# Patient Record
Sex: Female | Born: 1970
Health system: Southern US, Community
[De-identification: ages and names within clinical notes are randomized; demographics above are authoritative.]

## PROBLEM LIST (undated history)

## (undated) ENCOUNTER — Inpatient Hospital Stay (HOSPITAL_COMMUNITY): Payer: Self-pay

## (undated) DIAGNOSIS — IMO0002 Reserved for concepts with insufficient information to code with codable children: Secondary | ICD-10-CM

## (undated) DIAGNOSIS — D571 Sickle-cell disease 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: Secondary | ICD-10-CM

## (undated) DIAGNOSIS — J189 Pneumonia, unspecified organism: Secondary | ICD-10-CM

## (undated) DIAGNOSIS — S81809A Unspecified open wound, unspecified lower leg, initial encounter: Secondary | ICD-10-CM

## (undated) DIAGNOSIS — R001 Bradycardia, unspecified: Secondary | ICD-10-CM

## (undated) DIAGNOSIS — K8020 Calculus of gallbladder without cholecystitis without obstruction: Secondary | ICD-10-CM

## (undated) DIAGNOSIS — R7989 Other specified abnormal findings of blood chemistry: Secondary | ICD-10-CM

## (undated) DIAGNOSIS — D649 Anemia, unspecified: Secondary | ICD-10-CM

## (undated) DIAGNOSIS — I739 Peripheral vascular disease, unspecified: Secondary | ICD-10-CM

## (undated) DIAGNOSIS — R945 Abnormal results of liver function studies: Secondary | ICD-10-CM

## (undated) DIAGNOSIS — L97909 Non-pressure chronic ulcer of unspecified part of unspecified lower leg with unspecified severity: Secondary | ICD-10-CM

## (undated) HISTORY — DX: Bradycardia, unspecified: R00.1

## (undated) HISTORY — DX: Pneumonia, unspecified organism: J18.9

## (undated) HISTORY — DX: Non-pressure chronic ulcer of unspecified part of unspecified lower leg with unspecified severity: L97.909

## (undated) HISTORY — DX: Calculus of gallbladder without cholecystitis without obstruction: K80.20

## (undated) HISTORY — DX: Reserved for concepts with insufficient information to code with codable children: IMO0002

## (undated) HISTORY — PX: BREAST SURGERY: SHX581

## (undated) HISTORY — DX: Peripheral vascular disease, unspecified: I73.9

## (undated) HISTORY — PX: TONSILLECTOMY: SUR1361

---

## 2010-07-08 ENCOUNTER — Emergency Department (HOSPITAL_COMMUNITY): Payer: Medicaid - Out of State

## 2010-07-08 ENCOUNTER — Inpatient Hospital Stay (HOSPITAL_COMMUNITY)
Admission: EM | Admit: 2010-07-08 | Discharge: 2010-07-10 | DRG: 781 | Discharge status: Against Medical Advice | Payer: Medicaid - Out of State | Attending: Family Medicine | Admitting: Family Medicine

## 2010-07-08 ENCOUNTER — Inpatient Hospital Stay (HOSPITAL_COMMUNITY): Payer: Medicaid - Out of State

## 2010-07-08 DIAGNOSIS — E876 Hypokalemia: Secondary | ICD-10-CM | POA: Diagnosis present

## 2010-07-08 DIAGNOSIS — E871 Hypo-osmolality and hyponatremia: Secondary | ICD-10-CM | POA: Diagnosis present

## 2010-07-08 DIAGNOSIS — D5701 Hb-SS disease with acute chest syndrome: Secondary | ICD-10-CM | POA: Diagnosis present

## 2010-07-08 DIAGNOSIS — D5700 Hb-SS disease with crisis, unspecified: Secondary | ICD-10-CM | POA: Diagnosis present

## 2010-07-08 DIAGNOSIS — D509 Iron deficiency anemia, unspecified: Secondary | ICD-10-CM

## 2010-07-08 DIAGNOSIS — O09529 Supervision of elderly multigravida, unspecified trimester: Secondary | ICD-10-CM | POA: Diagnosis present

## 2010-07-08 DIAGNOSIS — O99019 Anemia complicating pregnancy, unspecified trimester: Principal | ICD-10-CM | POA: Diagnosis present

## 2010-07-08 DIAGNOSIS — Z331 Pregnant state, incidental: Secondary | ICD-10-CM

## 2010-07-08 LAB — URINALYSIS, ROUTINE W REFLEX MICROSCOPIC
Bilirubin Urine: NEGATIVE
Hgb urine dipstick: NEGATIVE
Ketones, ur: NEGATIVE mg/dL
Nitrite: NEGATIVE
Protein, ur: NEGATIVE mg/dL
Specific Gravity, Urine: 1.012 (ref 1.005–1.030)
Urine Glucose, Fasting: NEGATIVE mg/dL
Urobilinogen, UA: 1 mg/dL (ref 0.0–1.0)
pH: 7.5 (ref 5.0–8.0)

## 2010-07-08 LAB — CBC
HCT: 22.5 % — ABNORMAL LOW (ref 36.0–46.0)
Hemoglobin: 8.4 g/dL — ABNORMAL LOW (ref 12.0–15.0)
MCH: 30 pg (ref 26.0–34.0)
MCHC: 37.3 g/dL — ABNORMAL HIGH (ref 30.0–36.0)
MCV: 80.4 fL (ref 78.0–100.0)
Platelets: 423 10*3/uL — ABNORMAL HIGH (ref 150–400)
RBC: 2.8 MIL/uL — ABNORMAL LOW (ref 3.87–5.11)
RDW: 20.4 % — ABNORMAL HIGH (ref 11.5–15.5)
WBC: 21.3 10*3/uL — ABNORMAL HIGH (ref 4.0–10.5)

## 2010-07-08 LAB — BASIC METABOLIC PANEL
BUN: 4 mg/dL — ABNORMAL LOW (ref 6–23)
CO2: 22 mEq/L (ref 19–32)
Calcium: 9.2 mg/dL (ref 8.4–10.5)
Chloride: 102 mEq/L (ref 96–112)
Creatinine, Ser: 0.31 mg/dL — ABNORMAL LOW (ref 0.4–1.2)
GFR calc Af Amer: >60 mL/min (ref >60)
GFR calc non Af Amer: >60 mL/min (ref >60)
Glucose, Bld: 94 mg/dL (ref 70–99)
Potassium: 3 mEq/L — ABNORMAL LOW (ref 3.5–5.1)
Sodium: 134 mEq/L — ABNORMAL LOW (ref 135–145)

## 2010-07-08 LAB — DIFFERENTIAL
Basophils Absolute: 0.2 10*3/uL — ABNORMAL HIGH (ref 0.0–0.1)
Basophils Relative: 1 % (ref 0–1)
Eosinophils Absolute: 0.2 10*3/uL (ref 0.0–0.7)
Eosinophils Relative: 1 % (ref 0–5)
Lymphocytes Relative: 15 % (ref 12–46)
Lymphs Abs: 3.2 10*3/uL (ref 0.7–4.0)
Monocytes Absolute: 2.2 10*3/uL — ABNORMAL HIGH (ref 0.1–1.0)
Monocytes Relative: 10 % (ref 3–12)
Neutro Abs: 15.5 10*3/uL — ABNORMAL HIGH (ref 1.7–7.7)
Neutrophils Relative %: 73 % (ref 43–77)

## 2010-07-08 LAB — POCT PREGNANCY, URINE: Preg Test, Ur: POSITIVE

## 2010-07-08 LAB — RETICULOCYTES
RBC.: 2.8 MIL/uL — ABNORMAL LOW (ref 3.87–5.11)
Retic Count, Absolute: 529.2 10*3/uL — ABNORMAL HIGH (ref 19.0–186.0)
Retic Ct Pct: 18.9 % — ABNORMAL HIGH (ref 0.4–3.1)

## 2010-07-09 LAB — BASIC METABOLIC PANEL
BUN: <1 mg/dL — ABNORMAL LOW (ref 6–23)
BUN: 2 mg/dL — ABNORMAL LOW (ref 6–23)
Creatinine, Ser: <3 mg/dL — ABNORMAL HIGH (ref 0.4–1.2)
Creatinine, Ser: <0.3 mg/dL — ABNORMAL LOW (ref 0.4–1.2)
Glucose, Bld: 121 mg/dL — ABNORMAL HIGH (ref 70–99)
Glucose, Bld: 126 mg/dL — ABNORMAL HIGH (ref 70–99)
Potassium: 2.9 mEq/L — ABNORMAL LOW (ref 3.5–5.1)
Potassium: 3.3 mEq/L — ABNORMAL LOW (ref 3.5–5.1)

## 2010-07-09 LAB — CBC
HCT: 19.2 % — ABNORMAL LOW (ref 36.0–46.0)
MCH: 29.7 pg (ref 26.0–34.0)
MCHC: 36.5 g/dL — ABNORMAL HIGH (ref 30.0–36.0)
MCV: 81.4 fL (ref 78.0–100.0)
Platelets: 315 10*3/uL (ref 150–400)
RDW: 20 % — ABNORMAL HIGH (ref 11.5–15.5)

## 2010-07-09 LAB — MAGNESIUM
Magnesium: 1.7 mg/dL (ref 1.5–2.5)
Magnesium: 1.8 mg/dL (ref 1.5–2.5)

## 2010-07-10 LAB — CBC
HCT: 16.8 % — ABNORMAL LOW (ref 36.0–46.0)
Hemoglobin: 6.1 g/dL — CL (ref 12.0–15.0)
Hemoglobin: 7.1 g/dL — ABNORMAL LOW (ref 12.0–15.0)
MCH: 29.5 pg (ref 26.0–34.0)
MCHC: 36.3 g/dL — ABNORMAL HIGH (ref 30.0–36.0)
RBC: 2.47 MIL/uL — ABNORMAL LOW (ref 3.87–5.11)
RDW: 19.5 % — ABNORMAL HIGH (ref 11.5–15.5)
WBC: 22.6 10*3/uL — ABNORMAL HIGH (ref 4.0–10.5)

## 2010-07-10 LAB — BASIC METABOLIC PANEL
CO2: 25 mEq/L (ref 19–32)
Calcium: 8.9 mg/dL (ref 8.4–10.5)
Creatinine, Ser: <0.3 mg/dL — ABNORMAL LOW (ref 0.4–1.2)
Glucose, Bld: 94 mg/dL (ref 70–99)

## 2010-07-10 LAB — ABO/RH: ABO/RH(D): B POS

## 2010-07-10 NOTE — H&P (Signed)
NAME:  [name], [name] NO.:  [id number]  MEDICAL RECORD NO.:  [id number]           PATIENT TYPE:  E  LOCATION:  [facility]                         FACILITY:  [facility]  PHYSICIAN:  Leighton Roach Deepika [place], M.D.DATE OF BIRTH:  Jul 08, 1970  DATE OF ADMISSION:  07/08/2010 DATE OF DISCHARGE:                             HISTORY & PHYSICAL   PRIMARY CARE PROVIDER:  The patient is unassigned, traveling for [place].  CHIEF COMPLAINT:  Sickle cell pain, crisis pain everywhere.  HISTORY OF PRESENT ILLNESS:  The patient presents complaining of sickle cell pain crisis that began this morning.  She says that she hurts all over her body but it is worse in her legs and in her back.  The patient says this is a typical pain crisis for her.  The patient says that the 1 mg doses of Dilaudid that she was on in the ED are not helping her pain. She says she has had a cough for about 2 weeks prior to the pain crisis starting, but denies any fevers or chills.  She says that she has hospitalized once or twice a year for pain crisis and her baseline hemoglobin is 7-8.  The patient is reportedly [redacted] weeks pregnant and has a positive urine pregnancy test here in the emergency room.  PAST MEDICAL HISTORY:  Significant only for sickle cell disease.  PAST SURGICAL HISTORY:  None.  SOCIAL HISTORY:  The patient lives with her father.  She braids hair for a living.  Denies tobacco, alcohol, or drug use. English as second language.  Native language is [place]. Lives in [place], [place].  FAMILY HISTORY:  The patient's mother has asthma and diabetes.  The patient's sister has sickle cell disease.  REVIEW OF SYSTEMS:  Negative for fevers or chills.  Positive for chest pain.  Negative for palpitations.  Positive for cough for 2 weeks, positive for nausea.  No vomiting.  ALLERGIES:  No known drug allergies.  MEDICATIONS:  Prenatal vitamin, folic acid, and Percocet 5/325 p.o. q.4 p.r.n. pain.  PHYSICAL  EXAMINATION:  VITAL SIGNS:  Temperature is 98.3, pulse 91, respiratory rate is 20, blood pressure is 130/64, pulse ox is 98% on room air.  GENERAL:  The patient is in distress.  She is in pain. HEAD, EYES, EARS, NOSE AND THROAT:  Extraocular movements intact.  Oral mucosa is moist. NECK:  Supple. CARDIOVASCULAR:  Regular rate and rhythm.  No murmurs, rubs, or gallops. LUNGS:  Clear to auscultation bilaterally.  The patient is in distress and appears to have increased work of breathing that may be from pain. ABDOMEN:  Positive bowel sounds.  Soft, diffuse tenderness. EXTREMITIES: 2+ pulses.  No edema. NEUROLOGIC:  The patient is alert and oriented x3.  No focal deficits. MUSCULOSKELETAL:  No joint effusions.  LABORATORY DATA AND STUDIES:  CBC with differential, white blood cell count 21.3, hemoglobin 8.4, hematocrit 22.5, platelets 423, absolute neutrophils 15.5, absolute lymphocyte 33.2, absolute monocytes 2.2, absolute eosinophil 0.2, percent retic 18.9, RBCs 2.8, absolute reticulocytes is 529.2.  Urinalysis within normal limits.  A urine pregnancy test is positive.  Basic metabolic panel, sodium  134, potassium 3.0, chloride 102, CO2 of 22, BUN 4, creatinine 0.31, glucose 94.  A chest x-ray shows borderline cardiomegaly and interstitial probable scarring compatible was given, history of sickle cell disease, although acute chest syndrome could have similar appearance.  ASSESSMENT AND PLAN:  This is a 40 year old female with history of sickle cell disease who is [redacted] weeks pregnant who presents with sickle cell pain crisis and concern for acute chest.  1. Pain crisis.  We will admit the patient to a step-down bed and give     Dilaudid 2 mg IV q.2 p.r.n. for pain.  We will monitor her     hemoglobin and transfuse if necessary, we will give IV fluids. 2. Concern for acute chest given changes on CXR that may be secondary to prior acute chest syndrome events.      The patient is in distress  from her pain, but was initially satting well on room air.  She is now being       put on oxygen due to her  distress.  We will treat her for acute chest     syndrome with ceftriaxone and azithromycin.  We will monitor her     respiratory status closely, give oxygen as needed. 3.  11 and 4/7 by live intrauterine preganancyOB ultrasound on admission .  We will continue her prenatal vitamin.  We will try to   minimize narcotics with the lowest doses that control her pain.  We     will now give NSAIDs.   4. FEN/GI:  We will give normal saline with 20 mEq of KCl at 125 mL an     hour as the patient's potassium is slightly low.  We will give     Zofran p.r.n. for nausea and Protonix for GI prophylaxis.  We will     give a regular diet as tolerated. 5. DVT prophylaxis.  We will give Lovenox as it is pregnancy category     B. 6. Disposition is pending clinical improvement.    ______________________________ Ardyth Gal, MD   ______________________________ Leighton Roach Dayveon Halley, M.D.    CR/MEDQ  D:  07/08/2010  T:  07/09/2010  Job:  [id number]  Electronically Signed by Ardyth Gal MD on 07/09/2010 03:32:59 PM Electronically Signed by Acquanetta Belling M.D. on 07/10/2010 11:42:02 AM

## 2010-07-11 LAB — CROSSMATCH: Unit division: 0

## 2010-08-20 NOTE — Discharge Summary (Signed)
NAMEHARLIE, [name] NO.:  [id number]  MEDICAL RECORD NO.:  [id number]           PATIENT TYPE:  E  LOCATION:  [facility]                         FACILITY:  [facility]  PHYSICIAN:  Santiago Bumpers. Citlalli Weikel, M.D.DATE OF BIRTH:  Dec 13, 1970  DATE OF ADMISSION:  07/08/2010 DATE OF DISCHARGE:  07/10/2010                              DISCHARGE SUMMARY   The patient left AMA on July 10, 2010.  DISCHARGE DIAGNOSES: 1. The patient left against medical advice. 2. Sickle cell pain crisis. 3. Acute chest syndrome. 4. Pregnancy, first trimester.  DISCHARGE MEDICATIONS:  The patient was given no prescriptions as she left AMA.  PERTINENT LABORATORY VALUES:  On July 08, 2010, a urine pregnancy test was positive.  A CBC with differential white blood cell count 21.3, hemoglobin 8.4, hematocrit 23.5, platelets 423, absolute neutrophils 15.5, absolute lymphocytes 3.2, absolute monocytes 2.2, eosinophils 0.2, basophils 0.2.  A urinalysis was within normal limits.  On July 09, 2010, CBC, white blood cell count 24.8, hemoglobin 7.0, hematocrit 19.2, platelets 315.  On July 10, 2010, white blood cell count 19.2, hemoglobin 6.1, hematocrit 16.8, platelets 284.  On July 10, 2010, later posttransfusion CBC white blood cell count 22.6, hemoglobin 7.1, hematocrit 19.5, platelets 288.  RADIOLOGY:  On July 08, 2010, chest x-ray showed borderline cardiomegaly and interstitial probable scarring compatible with given history of sickle cell disease, although acute chest syndrome could have a similar appearance.  Ultrasound OB on July 08, 2010, showed an 50- week 4-day live intrauterine gestation.  PROCEDURES:  Blood transfusion of 1 unit packed red blood cells.  BRIEF HOSPITAL COURSE:  Ms. Oetken is a 40 year old female with past medical history of sickle cell disease who presented to the hospital with sickle cell pain crisis.  She was admitted to step-down unit, placed on  a Dilaudid PCA and monitored closely on. 1. Sickle cell.  The patient had severe pain and she required high     doses of Dilaudid for pain control.  Her respiratory status was     monitored closely.  She did require oxygen during hospitalization.     There is concern that the patient had acute chest syndrome, so she     was started on azithromycin and ceftriaxone for treatment of this.     On July 10, 2010, the patient's hemoglobin dropped to 6.1, and     she was transfused 1 unit of packed red blood cells and her     hemoglobin responded appropriately. 2. First trimester pregnancy.  The patient knew she was pregnant on     presentation.  Ultrasound was done which confirmed a viable healthy     intrauterine pregnancy.  The patient was placed on carefully     selected medications, all medications she was given during     hospitalization for pregnancy class B with the exception of     narcotic medications which are pregnancy class C, which were     required to treat the patient's sickle cell pain crisis. 3. The patient left AMA.  The patient informed the medical team that     she had to  leave on the July 10, 2010, because she was not to     leave the state of [place], she is an immigrant and have had a     visit from her Sports administrator on the next day.  She was not     supposed to have left the state of [place].  The patient was     advised that she may have acute chest syndrome and that her pain     would not be controlled as she left the hospital and on that she     could pose a risk to herself as well as her unborn child.  The     patient understood these risks, however, felt it was necessary for     her to travel back to [place] and so the patient left AMA.  DISCHARGE CONDITION:  The patient left against medical advice on July 10, 2010.    ______________________________ Ardyth Gal, MD   ______________________________ Santiago Bumpers. Leveda Anna,  M.D.    CR/MEDQ  D:  08/15/2010  T:  08/16/2010  Job:  [id number]  Electronically Signed by Ardyth Gal MD on 08/17/2010 04:56:51 PM Electronically Signed by Doralee Albino M.D. on 08/20/2010 08:51:44 AM

## 2012-03-29 ENCOUNTER — Emergency Department (HOSPITAL_COMMUNITY): Payer: Medicaid - Out of State

## 2012-03-29 ENCOUNTER — Inpatient Hospital Stay (HOSPITAL_COMMUNITY)
Admission: EM | Admit: 2012-03-29 | Discharge: 2012-04-06 | DRG: 812 | Disposition: A | Discharge status: Home / Self Care | Payer: Medicaid - Out of State | Attending: Internal Medicine | Admitting: Internal Medicine

## 2012-03-29 ENCOUNTER — Encounter (HOSPITAL_COMMUNITY): Payer: Self-pay | Admitting: Emergency Medicine

## 2012-03-29 DIAGNOSIS — R197 Diarrhea, unspecified: Secondary | ICD-10-CM

## 2012-03-29 DIAGNOSIS — L97309 Non-pressure chronic ulcer of unspecified ankle with unspecified severity: Secondary | ICD-10-CM | POA: Diagnosis present

## 2012-03-29 DIAGNOSIS — D72829 Elevated white blood cell count, unspecified: Secondary | ICD-10-CM

## 2012-03-29 DIAGNOSIS — D571 Sickle-cell disease 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: Secondary | ICD-10-CM

## 2012-03-29 DIAGNOSIS — R109 Unspecified abdominal pain: Secondary | ICD-10-CM

## 2012-03-29 DIAGNOSIS — IMO0002 Reserved for concepts with insufficient information to code with codable children: Secondary | ICD-10-CM

## 2012-03-29 DIAGNOSIS — K5900 Constipation, unspecified: Secondary | ICD-10-CM

## 2012-03-29 DIAGNOSIS — D5701 Hb-SS disease with acute chest syndrome: Secondary | ICD-10-CM | POA: Diagnosis present

## 2012-03-29 DIAGNOSIS — D5700 Hb-SS disease with crisis, unspecified: Principal | ICD-10-CM

## 2012-03-29 DIAGNOSIS — R112 Nausea with vomiting, unspecified: Secondary | ICD-10-CM

## 2012-03-29 DIAGNOSIS — R509 Fever, unspecified: Secondary | ICD-10-CM

## 2012-03-29 DIAGNOSIS — E871 Hypo-osmolality and hyponatremia: Secondary | ICD-10-CM

## 2012-03-29 DIAGNOSIS — E876 Hypokalemia: Secondary | ICD-10-CM

## 2012-03-29 DIAGNOSIS — K5903 Drug induced constipation: Secondary | ICD-10-CM | POA: Diagnosis not present

## 2012-03-29 DIAGNOSIS — R001 Bradycardia, unspecified: Secondary | ICD-10-CM

## 2012-03-29 DIAGNOSIS — R17 Unspecified jaundice: Secondary | ICD-10-CM

## 2012-03-29 DIAGNOSIS — R7989 Other specified abnormal findings of blood chemistry: Secondary | ICD-10-CM

## 2012-03-29 DIAGNOSIS — D649 Anemia, unspecified: Secondary | ICD-10-CM

## 2012-03-29 DIAGNOSIS — K8020 Calculus of gallbladder without cholecystitis without obstruction: Secondary | ICD-10-CM

## 2012-03-29 DIAGNOSIS — I498 Other specified cardiac arrhythmias: Secondary | ICD-10-CM | POA: Diagnosis present

## 2012-03-29 DIAGNOSIS — L089 Local infection of the skin and subcutaneous tissue, unspecified: Secondary | ICD-10-CM

## 2012-03-29 HISTORY — DX: Anemia, unspecified: D64.9

## 2012-03-29 HISTORY — DX: Sickle-cell disease 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: D57.1

## 2012-03-29 LAB — COMPREHENSIVE METABOLIC PANEL
AST: 175 U/L — ABNORMAL HIGH (ref 0–37)
Albumin: 4.8 g/dL (ref 3.5–5.2)
Alkaline Phosphatase: 72 U/L (ref 39–117)
BUN: 6 mg/dL (ref 6–23)
Chloride: 93 mEq/L — ABNORMAL LOW (ref 96–112)
Potassium: 3.4 mEq/L — ABNORMAL LOW (ref 3.5–5.1)
Total Bilirubin: 5.2 mg/dL — ABNORMAL HIGH (ref 0.3–1.2)

## 2012-03-29 LAB — CBC WITH DIFFERENTIAL/PLATELET
Basophils Relative: 1 % (ref 0–1)
Eosinophils Relative: 5 % (ref 0–5)
HCT: 16.1 % — ABNORMAL LOW (ref 36.0–46.0)
Hemoglobin: 6.4 g/dL — CL (ref 12.0–15.0)
Lymphs Abs: 2 10*3/uL (ref 0.7–4.0)
MCV: 72.9 fL — ABNORMAL LOW (ref 78.0–100.0)
Monocytes Relative: 14 % — ABNORMAL HIGH (ref 3–12)
Platelets: 269 10*3/uL (ref 150–400)
RBC: 2.21 MIL/uL — ABNORMAL LOW (ref 3.87–5.11)
WBC: 12 10*3/uL — ABNORMAL HIGH (ref 4.0–10.5)

## 2012-03-29 LAB — POCT I-STAT TROPONIN I: Troponin i, poc: 0.01 ng/mL (ref 0.00–0.08)

## 2012-03-29 MED ORDER — HYDROMORPHONE HCL PF 1 MG/ML IJ SOLN
1.0000 mg | Freq: Once | INTRAMUSCULAR | Status: AC
  Administered 2012-03-30: 1 mg via INTRAVENOUS
  Filled 2012-03-29: qty 1

## 2012-03-29 MED ORDER — SODIUM CHLORIDE 0.9 % IV BOLUS (SEPSIS)
1000.0000 mL | Freq: Once | INTRAVENOUS | Status: AC
  Administered 2012-03-30: 1000 mL via INTRAVENOUS

## 2012-03-29 NOTE — ED Notes (Addendum)
Pt currently in xray, lab notified ED of low hgb (6.4), hgb hx reviewed, usually low, (6.1 62yr ago), acuity changed, finding room for pt.

## 2012-03-29 NOTE — ED Notes (Signed)
Patient complaining of nausea, vomiting, fever, dizziness chest pain, diarrhea, and back pain for the last four days.  Patient reports history of sickle cell; feels that her symptoms are related to a crisis.

## 2012-03-30 ENCOUNTER — Inpatient Hospital Stay (HOSPITAL_COMMUNITY): Payer: Medicaid - Out of State

## 2012-03-30 ENCOUNTER — Encounter (HOSPITAL_COMMUNITY): Payer: Self-pay | Admitting: Nurse Practitioner

## 2012-03-30 ENCOUNTER — Emergency Department (HOSPITAL_COMMUNITY): Payer: Medicaid - Out of State

## 2012-03-30 DIAGNOSIS — R109 Unspecified abdominal pain: Secondary | ICD-10-CM | POA: Diagnosis present

## 2012-03-30 DIAGNOSIS — D5700 Hb-SS disease with crisis, unspecified: Secondary | ICD-10-CM | POA: Diagnosis present

## 2012-03-30 DIAGNOSIS — K8020 Calculus of gallbladder without cholecystitis without obstruction: Secondary | ICD-10-CM | POA: Diagnosis present

## 2012-03-30 DIAGNOSIS — R112 Nausea with vomiting, unspecified: Secondary | ICD-10-CM

## 2012-03-30 DIAGNOSIS — R7989 Other specified abnormal findings of blood chemistry: Secondary | ICD-10-CM

## 2012-03-30 DIAGNOSIS — D72829 Elevated white blood cell count, unspecified: Secondary | ICD-10-CM | POA: Diagnosis present

## 2012-03-30 DIAGNOSIS — D571 Sickle-cell disease 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: Secondary | ICD-10-CM

## 2012-03-30 DIAGNOSIS — D649 Anemia, unspecified: Secondary | ICD-10-CM

## 2012-03-30 DIAGNOSIS — E876 Hypokalemia: Secondary | ICD-10-CM | POA: Diagnosis present

## 2012-03-30 DIAGNOSIS — R17 Unspecified jaundice: Secondary | ICD-10-CM

## 2012-03-30 HISTORY — DX: Calculus of gallbladder without cholecystitis without obstruction: K80.20

## 2012-03-30 HISTORY — DX: Anemia, unspecified: D64.9

## 2012-03-30 LAB — CBC
MCH: 28.5 pg (ref 26.0–34.0)
MCHC: 38.4 g/dL — ABNORMAL HIGH (ref 30.0–36.0)
MCV: 74.2 fL — ABNORMAL LOW (ref 78.0–100.0)
Platelets: 209 10*3/uL (ref 150–400)
RDW: 22.5 % — ABNORMAL HIGH (ref 11.5–15.5)

## 2012-03-30 LAB — COMPREHENSIVE METABOLIC PANEL
AST: 121 U/L — ABNORMAL HIGH (ref 0–37)
Albumin: 3.4 g/dL — ABNORMAL LOW (ref 3.5–5.2)
Calcium: 7.3 mg/dL — ABNORMAL LOW (ref 8.4–10.5)
Creatinine, Ser: 0.41 mg/dL — ABNORMAL LOW (ref 0.50–1.10)
GFR calc non Af Amer: >90 mL/min (ref >90)
Sodium: 139 mEq/L (ref 135–145)
Total Protein: 6.1 g/dL (ref 6.0–8.3)

## 2012-03-30 LAB — URINE MICROSCOPIC-ADD ON

## 2012-03-30 LAB — RETICULOCYTES
RBC.: 2.06 MIL/uL — ABNORMAL LOW (ref 3.87–5.11)
Retic Count, Absolute: 350.2 10*3/uL — ABNORMAL HIGH (ref 19.0–186.0)

## 2012-03-30 LAB — URINALYSIS, ROUTINE W REFLEX MICROSCOPIC
Glucose, UA: NEGATIVE mg/dL
Leukocytes, UA: NEGATIVE
Protein, ur: >300 mg/dL — AB
Specific Gravity, Urine: 1.014 (ref 1.005–1.030)
pH: 5.5 (ref 5.0–8.0)

## 2012-03-30 LAB — PREPARE RBC (CROSSMATCH)

## 2012-03-30 MED ORDER — KETOROLAC TROMETHAMINE 30 MG/ML IJ SOLN
30.0000 mg | Freq: Four times a day (QID) | INTRAMUSCULAR | Status: AC | PRN
  Administered 2012-03-30 – 2012-04-04 (×7): 30 mg via INTRAVENOUS
  Filled 2012-03-30 (×7): qty 1

## 2012-03-30 MED ORDER — SODIUM CHLORIDE 0.9 % IJ SOLN: 9.0000 mL | INTRAMUSCULAR | Status: DC | PRN

## 2012-03-30 MED ORDER — ONDANSETRON HCL 4 MG/2ML IJ SOLN: 4.0000 mg | Freq: Three times a day (TID) | INTRAMUSCULAR | Status: AC | PRN

## 2012-03-30 MED ORDER — DOCUSATE SODIUM 100 MG PO CAPS
100.0000 mg | ORAL_CAPSULE | Freq: Two times a day (BID) | ORAL | Status: DC
  Administered 2012-03-30 – 2012-04-03 (×9): 100 mg via ORAL
  Filled 2012-03-30 (×18): qty 1

## 2012-03-30 MED ORDER — PANTOPRAZOLE SODIUM 40 MG IV SOLR
40.0000 mg | INTRAVENOUS | Status: DC
  Administered 2012-03-30 – 2012-04-01 (×3): 40 mg via INTRAVENOUS
  Filled 2012-03-30 (×5): qty 40

## 2012-03-30 MED ORDER — NALOXONE HCL 0.4 MG/ML IJ SOLN: 0.4000 mg | INTRAMUSCULAR | Status: DC | PRN

## 2012-03-30 MED ORDER — IBUPROFEN 400 MG PO TABS
400.0000 mg | ORAL_TABLET | Freq: Once | ORAL | Status: AC
  Administered 2012-03-30: 400 mg via ORAL
  Filled 2012-03-30: qty 1

## 2012-03-30 MED ORDER — HYDROMORPHONE HCL PF 1 MG/ML IJ SOLN
1.0000 mg | INTRAMUSCULAR | Status: AC | PRN
  Administered 2012-03-30 (×2): 1 mg via INTRAVENOUS
  Filled 2012-03-30 (×2): qty 1

## 2012-03-30 MED ORDER — SODIUM CHLORIDE 0.9 % IV BOLUS (SEPSIS)
1000.0000 mL | Freq: Once | INTRAVENOUS | Status: AC
  Administered 2012-03-30: 1000 mL via INTRAVENOUS

## 2012-03-30 MED ORDER — BIOTENE DRY MOUTH MT LIQD
15.0000 mL | Freq: Two times a day (BID) | OROMUCOSAL | Status: DC
  Administered 2012-03-30 – 2012-04-05 (×9): 15 mL via OROMUCOSAL

## 2012-03-30 MED ORDER — ENOXAPARIN SODIUM 60 MG/0.6ML ~~LOC~~ SOLN
55.0000 mg | Freq: Two times a day (BID) | SUBCUTANEOUS | Status: DC
  Administered 2012-03-30 – 2012-04-01 (×4): 55 mg via SUBCUTANEOUS
  Filled 2012-03-30 (×8): qty 0.6

## 2012-03-30 MED ORDER — SODIUM CHLORIDE 0.9 % IV SOLN: INTRAVENOUS | Status: AC

## 2012-03-30 MED ORDER — HYDROMORPHONE 0.3 MG/ML IV SOLN
INTRAVENOUS | Status: DC
  Administered 2012-03-30: 21:00:00 via INTRAVENOUS
  Administered 2012-03-31: 4.79 mg via INTRAVENOUS
  Administered 2012-03-31: 1.99 mg via INTRAVENOUS
  Administered 2012-03-31: 06:00:00 via INTRAVENOUS
  Administered 2012-03-31: 6.39 mg via INTRAVENOUS
  Administered 2012-03-31 (×2): via INTRAVENOUS
  Administered 2012-03-31: 0.8 mg via INTRAVENOUS
  Administered 2012-03-31: 2.79 mg via INTRAVENOUS
  Administered 2012-03-31: 4.39 mg via INTRAVENOUS
  Administered 2012-04-01: 05:00:00 via INTRAVENOUS
  Administered 2012-04-01: 1.95 mg via INTRAVENOUS
  Administered 2012-04-01: 4.99 mg via INTRAVENOUS
  Administered 2012-04-01: 12:00:00 via INTRAVENOUS
  Filled 2012-03-30 (×6): qty 25

## 2012-03-30 MED ORDER — ONDANSETRON HCL 4 MG/2ML IJ SOLN
4.0000 mg | Freq: Four times a day (QID) | INTRAMUSCULAR | Status: DC | PRN
  Administered 2012-04-01 – 2012-04-02 (×2): 4 mg via INTRAVENOUS
  Filled 2012-03-30 (×2): qty 2

## 2012-03-30 MED ORDER — DIPHENHYDRAMINE HCL 12.5 MG/5ML PO ELIX: 12.5000 mg | ORAL_SOLUTION | Freq: Four times a day (QID) | ORAL | Status: DC | PRN

## 2012-03-30 MED ORDER — POTASSIUM CHLORIDE IN NACL 20-0.45 MEQ/L-% IV SOLN
INTRAVENOUS | Status: DC
  Administered 2012-03-30: 21:00:00 via INTRAVENOUS
  Filled 2012-03-30 (×2): qty 1000

## 2012-03-30 MED ORDER — ONDANSETRON HCL 4 MG PO TABS: 4.0000 mg | ORAL_TABLET | Freq: Four times a day (QID) | ORAL | Status: DC | PRN

## 2012-03-30 MED ORDER — DIPHENHYDRAMINE HCL 50 MG/ML IJ SOLN
12.5000 mg | Freq: Four times a day (QID) | INTRAMUSCULAR | Status: DC | PRN
Start: 2012-03-30 — End: 2012-04-06

## 2012-03-30 MED ORDER — SODIUM CHLORIDE 0.9 % IJ SOLN
3.0000 mL | Freq: Two times a day (BID) | INTRAMUSCULAR | Status: DC
  Administered 2012-03-30 – 2012-04-05 (×10): 3 mL via INTRAVENOUS

## 2012-03-30 MED ORDER — SENNOSIDES-DOCUSATE SODIUM 8.6-50 MG PO TABS
1.0000 | ORAL_TABLET | Freq: Every evening | ORAL | Status: DC | PRN
  Filled 2012-03-30 (×3): qty 2

## 2012-03-30 MED ORDER — SODIUM CHLORIDE 0.9 % IV SOLN
INTRAVENOUS | Status: AC
  Administered 2012-03-30: 1000 mL via INTRAVENOUS
  Administered 2012-03-30: 04:00:00 via INTRAVENOUS

## 2012-03-30 MED ORDER — LEVOFLOXACIN IN D5W 750 MG/150ML IV SOLN
750.0000 mg | INTRAVENOUS | Status: DC
  Administered 2012-03-30 – 2012-04-05 (×7): 750 mg via INTRAVENOUS
  Filled 2012-03-30 (×8): qty 150

## 2012-03-30 MED ORDER — ONDANSETRON HCL 4 MG/2ML IJ SOLN
4.0000 mg | Freq: Four times a day (QID) | INTRAMUSCULAR | Status: DC | PRN
  Administered 2012-03-30 – 2012-03-31 (×4): 4 mg via INTRAVENOUS
  Filled 2012-03-30 (×6): qty 2

## 2012-03-30 MED ORDER — HYDROMORPHONE HCL PF 1 MG/ML IJ SOLN: 2.0000 mg | INTRAMUSCULAR | Status: DC | PRN

## 2012-03-30 MED ORDER — FOLIC ACID 1 MG PO TABS
1.0000 mg | ORAL_TABLET | Freq: Every day | ORAL | Status: DC
  Administered 2012-03-30 – 2012-04-05 (×7): 1 mg via ORAL
  Filled 2012-03-30 (×8): qty 1

## 2012-03-30 MED ORDER — HYDROMORPHONE HCL PF 1 MG/ML IJ SOLN
2.0000 mg | INTRAMUSCULAR | Status: DC | PRN
  Administered 2012-03-30: 2 mg via INTRAVENOUS
  Filled 2012-03-30 (×2): qty 1

## 2012-03-30 NOTE — Progress Notes (Signed)
UR COMPLETED  

## 2012-03-30 NOTE — ED Notes (Signed)
Admitting physician at bedside

## 2012-03-30 NOTE — Consult Note (Signed)
Reason for Consult:  cholelithiasis Referring Physician: Marisa Severin, MD  [name] is an 41 y.o. female.  HPI:  Pt is a 41 year old female with sickle cell disease. She presents with 4 days of epigastric, chest, and abdominal pain.  She also has had some diarrhea. She complains of fevers and diffuse aches and pains. She is jaundiced.  She has had nausea and vomiting in association with this pain. She points to her epigastric region going up into her mid chest. She has also felt weaker and had some shortness of breath associated with this. She has had a sickle crisis around 2 years ago and does not think that this is the same as that one was.  She is from [place].    Past Medical History  Diagnosis Date  . Sickle cell disease     Past Surgical History  Procedure Date  . Cesarean section     History reviewed. No pertinent family history.  Social History:  reports that she has never smoked. She does not have any smokeless tobacco history on file. She reports that she does not drink alcohol or use illicit drugs.  Allergies: No Known Allergies  Medications:  MedicationsLong-Term  Prescriptions Show Facility-Administered Medications   acetaminophen (TYLENOL) 325 MG tablet  folic acid (FOLVITE) 1 MG tablet     Results for orders placed during the hospital encounter of 03/29/12 (from the past 48 hour(s))  CBC WITH DIFFERENTIAL     Status: Abnormal   Collection Time   03/29/12 10:06 PM      Component Value Range Comment   WBC 12.0 (*) 4.0 - 10.5 K/uL    RBC 2.21 (*) 3.87 - 5.11 MIL/uL    Hemoglobin 6.4 (*) 12.0 - 15.0 g/dL    HCT 11.9 (*) 14.7 - 46.0 %    MCV 72.9 (*) 78.0 - 100.0 fL    MCH 29.0  26.0 - 34.0 pg    MCHC 39.8 (*) 30.0 - 36.0 g/dL SICKLE CELLS   RDW 82.9 (*) 11.5 - 15.5 %    Platelets 269  150 - 400 K/uL PLATELET COUNT CONFIRMED BY SMEAR   Neutrophils Relative 63  43 - 77 %    Lymphocytes Relative 17  12 - 46 %    Monocytes Relative 14 (*) 3 - 12 %    Eosinophils Relative 5  0 - 5 %    Basophils Relative 1  0 - 1 %    Neutro Abs 7.6  1.7 - 7.7 K/uL    Lymphs Abs 2.0  0.7 - 4.0 K/uL    Monocytes Absolute 1.7 (*) 0.1 - 1.0 K/uL    Eosinophils Absolute 0.6  0.0 - 0.7 K/uL    Basophils Absolute 0.1  0.0 - 0.1 K/uL    RBC Morphology MARKED POLYCHROMASIA      WBC Morphology MILD LEFT SHIFT (1-5% METAS, OCC MYELO, OCC BANDS)     COMPREHENSIVE METABOLIC PANEL     Status: Abnormal   Collection Time   03/29/12 10:06 PM      Component Value Range Comment   Sodium 129 (*) 135 - 145 mEq/L    Potassium 3.4 (*) 3.5 - 5.1 mEq/L    Chloride 93 (*) 96 - 112 mEq/L    CO2 22  19 - 32 mEq/L    Glucose, Bld 105 (*) 70 - 99 mg/dL    BUN 6  6 - 23 mg/dL    Creatinine, Ser 5.62  0.50 - 1.10 mg/dL  Calcium 8.9  8.4 - 10.5 mg/dL    Total Protein 8.0  6.0 - 8.3 g/dL    Albumin 4.8  3.5 - 5.2 g/dL    AST 962 (*) 0 - 37 U/L    ALT 62 (*) 0 - 35 U/L    Alkaline Phosphatase 72  39 - 117 U/L    Total Bilirubin 5.2 (*) 0.3 - 1.2 mg/dL    GFR calc non Af Amer >90  >90 mL/min    GFR calc Af Amer >90  >90 mL/min   LIPASE, BLOOD     Status: Normal   Collection Time   03/29/12 10:06 PM      Component Value Range Comment   Lipase 30  11 - 59 U/L   POCT I-STAT TROPONIN I     Status: Normal   Collection Time   03/29/12 10:36 PM      Component Value Range Comment   Troponin i, poc 0.01  0.00 - 0.08 ng/mL    Comment 3            LACTIC ACID, PLASMA     Status: Normal   Collection Time   03/29/12 11:51 PM      Component Value Range Comment   Lactic Acid, Venous 1.0  0.5 - 2.2 mmol/L   RETICULOCYTES     Status: Abnormal   Collection Time   03/30/12 12:00 AM      Component Value Range Comment   Retic Ct Pct 17.0 (*) 0.4 - 3.1 % RESULTS CONFIRMED BY MANUAL DILUTION   RBC. 2.06 (*) 3.87 - 5.11 MIL/uL    Retic Count, Manual 350.2 (*) 19.0 - 186.0 K/uL   TYPE AND SCREEN     Status: Normal   Collection Time   03/30/12 12:17 AM      Component Value Range  Comment   ABO/RH(D) B POS      Antibody Screen NEG      Sample Expiration 04/02/2012     PREPARE RBC (CROSSMATCH)     Status: Normal   Collection Time   03/30/12 12:18 AM      Component Value Range Comment   Order Confirmation ORDER PROCESSED BY BLOOD BANK       Dg Chest 2 View  03/29/2012  *RADIOLOGY REPORT*  Clinical Data: Fever and emesis.  History of sickle cell disease.  CHEST - 2 VIEW  Comparison: 07/08/2010  Findings: Heart size is mildly enlarged.  There is no pleural effusion identified.  The bilateral, upper lobe predominant interstitial prominence is likely reflects scarring.  No superimposed airspace consolidation.  Bony changes of sickle cell disease noted.  IMPRESSION:  1.  No acute cardiopulmonary abnormalities noted.   Original Report Authenticated By: [name], M.D.    US Abdomen Complete  03/30/2012  *RADIOLOGY REPORT*  Clinical Data:  Elevated LFTs, jaundice.  Nausea, vomiting.  COMPLETE ABDOMINAL ULTRASOUND  Comparison:  None.  Findings:  Gallbladder:  Multiple mobile gallstones within the gallbladder. No wall thickening.  Negative sonographic Murphy's.  Common bile duct:   Normal caliber, 3 mm.  Liver:  No focal lesion identified.  Within normal limits in parenchymal echogenicity.  IVC:  Appears normal.  Pancreas:  No focal abnormality seen.  Spleen:  Not definitively visualized.  Right Kidney:   Slightly increased echotexture.  13.8 cm.  No hydronephrosis or focal abnormality.  Left Kidney:  13.2 cm.  Slightly increased echotexture.  No hydronephrosis or focal abnormality.  Abdominal aorta:  No  aneurysm identified.  IMPRESSION: Cholelithiasis.  No sonographic evidence of acute cholecystitis.   Original Report Authenticated By: Charlett Nose, M.D.     Review of Systems  Constitutional: Positive for fever and malaise/fatigue. Negative for chills.  HENT: Negative.   Eyes: Negative.   Respiratory: Positive for shortness of breath.   Cardiovascular: Positive for chest  pain.  Gastrointestinal: Positive for nausea, vomiting, abdominal pain and diarrhea.  Genitourinary: Negative.   Musculoskeletal: Positive for myalgias.  Skin:       Jaundice   Neurological: Negative.   Endo/Heme/Allergies: Negative.   Psychiatric/Behavioral: Negative.    Blood pressure 121/67, pulse 71, temperature 98.5 F (36.9 C), temperature source Oral, resp. rate 18, last menstrual period 03/22/2012, SpO2 98.00%. Physical Exam  Constitutional: She is oriented to person, place, and time. She appears well-developed and well-nourished. No distress.  HENT:  Head: Normocephalic and atraumatic.  Eyes: Conjunctivae normal are normal. Pupils are equal, round, and reactive to light. Scleral icterus is present.  Neck: Normal range of motion. Neck supple. No tracheal deviation present. No thyromegaly present.  Cardiovascular: Normal rate, regular rhythm, normal heart sounds and intact distal pulses.   No murmur heard. Respiratory: Effort normal and breath sounds normal. No respiratory distress. She has no wheezes. She has no rales. She exhibits no tenderness.  GI: Soft. Bowel sounds are normal. She exhibits no distension and no mass. There is Tenderness: mild tenderness in epigastrium.. There is no rebound and no guarding.  Musculoskeletal: Normal range of motion. She exhibits no edema and no tenderness.  Neurological: She is alert and oriented to person, place, and time.  Skin: Skin is warm and dry. She is not diaphoretic. No erythema.  Psychiatric: She has a normal mood and affect. Her behavior is normal. Judgment and thought content normal.    Assessment/Plan: Cholelithiasis. Patient does not appear to have any evidence of acute cholecystitis. Her bilirubin is elevated but she does not appear to have choledocholithiasis on imaging. I think her liver function abnormalities are reflection of a sickle cell crisis. Her hemoglobin is down and her reticulocyte count is up.  The surgery team  will follow her as she is admitted to the medicine service and hydrated and had her pain treated.  We will see if her pain resolves as her sickle cell crisis.  No need for acute surgical intervention. We'll follow.   Sundra Haddix 03/30/2012, 2:32 AM

## 2012-03-30 NOTE — Progress Notes (Signed)
Pt transferred to [facility] via ambulance.   No acute distress noted.  Amanda Pea, Charity fundraiser.

## 2012-03-30 NOTE — Progress Notes (Signed)
ANTICOAGULATION CONSULT NOTE - Initial Consult  Pharmacy Consult for lovenox Indication: r/o ACS; chest pain  No Known Allergies  Patient Measurements: Height: 5\' 7"  (170.2 cm) Weight: 122 lb 6.4 oz (55.52 kg) IBW/kg (Calculated) : 61.6  Heparin Dosing Weight: 55 kg  Vital Signs: Temp: 97.5 F (36.4 C) (11/11 0745) Temp src: Oral (11/11 0745) BP: 95/51 mmHg (11/11 0745) Pulse Rate: 53  (11/11 0745)  Labs:  Basename 03/30/12 0920 03/29/12 2206  HGB 6.3* 6.4*  HCT 16.4* 16.1*  PLT 209 269  APTT -- --  LABPROT -- --  INR -- --  HEPARINUNFRC -- --  CREATININE 0.41* 0.51  CKTOTAL -- --  CKMB -- --  TROPONINI -- --    Estimated Creatinine Clearance: 81.9 ml/min (by C-G formula based on Cr of 0.41).   Medical History: Past Medical History  Diagnosis Date  . Sickle cell disease   . Anemia 03/30/2012    Hx of sickle cell disease    Medications:  Scheduled:    . sodium chloride   Intravenous STAT  . antiseptic oral rinse  15 mL Mouth Rinse BID  . docusate sodium  100 mg Oral BID  . folic acid  1 mg Oral Daily  . [COMPLETED]  HYDROmorphone (DILAUDID) injection  1 mg Intravenous Once  . [COMPLETED] ibuprofen  400 mg Oral Once  . levofloxacin (LEVAQUIN) IV  750 mg Intravenous Q24H  . pantoprazole (PROTONIX) IV  40 mg Intravenous Q24H  . [COMPLETED] sodium chloride  1,000 mL Intravenous Once  . [COMPLETED] sodium chloride  1,000 mL Intravenous Once  . sodium chloride  3 mL Intravenous Q12H   Infusions:    . sodium chloride 1,000 mL (03/30/12 1058)    Assessment: 41 yo female with hx of sickle cell disease will be started on lovenox full dose for r/o ACS and chest pain.  Wt 55.5 kg and CrCl ~82.  H/H 6.3/16.4. Plt 209 Goal of Therapy:  Anti-Xa level 0.6-1.2 units/ml 4hrs after LMWH dose given Monitor platelets by anticoagulation protocol: Yes   Plan:  1) Lovenox 55mg  sq q12h 2) CBC every 72 hours (monitor closely)  Daren Yeagle, Tsz-Yin 03/30/2012,11:22 AM

## 2012-03-30 NOTE — Progress Notes (Signed)
Subjective:  Patient is a 41 year old the [place] female originally from [place] most recently from [place] 2 is admitted to the hospital with to sickle cell crisis. 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 She's not been feeling well for 4 days prior to admission. He is presently experiencing abdominal pain diffusely as well as limb pain. Her pain at most was a 10 out of 10. Presently it as a 8/10. She notes the present pain regimen is not controlling her pain  completely. This the first recent crisis. This is atypical for usual crisis which normally occurs in her extremities.   Her history is also significant for a nonhealing leg ulcer for the past year. She reports this area with nearly healed at times and draped back down again. She's not had on any other joint.  No Known Allergies Current Facility-Administered Medications  Medication Dose Route Frequency Provider Last Rate Last Dose  . 0.45 % NaCl with KCl 20 mEq / L infusion   Intravenous Continuous Gwenyth Bender, MD      . [COMPLETED] 0.9 %  sodium chloride infusion   Intravenous STAT Olivia Mackie, MD      . [EXPIRED] 0.9 %  sodium chloride infusion   Intravenous Continuous Rodolph Bong, MD 125 mL/hr at 03/30/12 1058 1,000 mL at 03/30/12 1058  . antiseptic oral rinse (BIOTENE) solution 15 mL  15 mL Mouth Rinse BID Tarry Kos, MD   15 mL at 03/30/12 2026  . diphenhydrAMINE (BENADRYL) injection 12.5 mg  12.5 mg Intravenous Q6H PRN Gwenyth Bender, MD       Or  . diphenhydrAMINE (BENADRYL) 12.5 MG/5ML elixir 12.5 mg  12.5 mg Oral Q6H PRN Gwenyth Bender, MD      . docusate sodium (COLACE) capsule 100 mg  100 mg Oral BID Rodolph Bong, MD   100 mg at 03/30/12 0931  . enoxaparin (LOVENOX) injection 55 mg  55 mg Subcutaneous Q12H Gwenyth Bender, MD   55 mg at 03/30/12 1528  . folic acid (FOLVITE) tablet 1 mg  1 mg Oral Daily Tarry Kos, MD   1 mg at 03/30/12 1610  . [COMPLETED] HYDROmorphone (DILAUDID) injection 1 mg  1 mg Intravenous Once Olivia Mackie, MD   1 mg at 03/30/12 0016  . [EXPIRED] HYDROmorphone (DILAUDID) injection 1 mg  1 mg Intravenous Q4H PRN Olivia Mackie, MD   1 mg at 03/30/12 1541  . HYDROmorphone (DILAUDID) injection 2 mg  2 mg Intravenous Q2H PRN Gwenyth Bender, MD      . HYDROmorphone (DILAUDID) PCA injection 0.3 mg/mL   Intravenous Q4H Gwenyth Bender, MD      . [address] ibuprofen (ADVIL,MOTRIN) tablet 400 mg  400 mg Oral Once Rolan Lipa, NP   400 mg at 03/30/12 0358  . ketorolac (TORADOL) 30 MG/ML injection 30 mg  30 mg Intravenous Q6H PRN Rodolph Bong, MD   30 mg at 03/30/12 2024  . levofloxacin (LEVAQUIN) IVPB 750 mg  750 mg Intravenous Q24H Rodolph Bong, MD   750 mg at 03/30/12 1527  . naloxone [facility]) injection 0.4 mg  0.4 mg Intravenous PRN Gwenyth Bender, MD       And  . sodium chloride 0.9 % injection 9 mL  9 mL Intravenous PRN Gwenyth Bender, MD      . [EXPIRED] ondansetron [facility]) injection 4 mg  4 mg Intravenous Q8H PRN Olivia Mackie, MD      .  ondansetron (ZOFRAN) tablet 4 mg  4 mg Oral Q6H PRN Tarry Kos, MD       Or  . ondansetron [facility]) injection 4 mg  4 mg Intravenous Q6H PRN Tarry Kos, MD   4 mg at 03/30/12 1910  . ondansetron (ZOFRAN) injection 4 mg  4 mg Intravenous Q6H PRN Gwenyth Bender, MD      . pantoprazole (PROTONIX) injection 40 mg  40 mg Intravenous Q24H Rodolph Bong, MD   40 mg at 03/30/12 1527  . senna-docusate (Senokot-S) tablet 1-2 tablet  1-2 tablet Oral QHS PRN Rodolph Bong, MD      . [COMPLETED] sodium chloride 0.9 % bolus 1,000 mL  1,000 mL Intravenous Once Olivia Mackie, MD   1,000 mL at 03/30/12 0016  . [COMPLETED] sodium chloride 0.9 % bolus 1,000 mL  1,000 mL Intravenous Once Olivia Mackie, MD   1,000 mL at 03/30/12 0145  . sodium chloride 0.9 % injection 3 mL  3 mL Intravenous Q12H Tarry Kos, MD   3 mL at 03/30/12 0929  . [DISCONTINUED] HYDROmorphone (DILAUDID) injection 2 mg  2 mg Intravenous Q4H PRN Tarry Kos, MD   2 mg at 03/30/12 1910     Objective: Blood pressure 100/52, pulse 85, temperature 97.6 F (36.4 C), temperature source Oral, resp. rate 15, height 5\' 7"  (1.702 m), weight 124 lb 1.9 oz (56.3 kg), last menstrual period 03/22/2012, SpO2 100.00%.   well-developed ill-appearing female presently in no acute distress. HEENT: no sinus tenderness. Positive sclera icterus. Posterior pharynx clear. NECK: no enlarged thyroid. No posterior cervical nodes. LUNGS: right basilar rales. No wheezes. No vocal fremitus. No CVA tenderness. CV: normal S1, S2 without S3. No breath. ABD: hypoactive bowel sounds. Minimal right upper quadrant and left quadrant tenderness. No rebound tenderness. MSK: minimal tenderness the knees. Negative Homans bilaterally. 3 cm leg ulcer with one central area of dried yellowish secretions. Negative Homans. NEURO: intact.   Lab results: Results for orders placed during the hospital encounter of 03/29/12 (from the past 48 hour(s))  CBC WITH DIFFERENTIAL     Status: Abnormal   Collection Time   03/29/12 10:06 PM      Component Value Range Comment   WBC 12.0 (*) 4.0 - 10.5 K/uL    RBC 2.21 (*) 3.87 - 5.11 MIL/uL    Hemoglobin 6.4 (*) 12.0 - 15.0 g/dL    HCT 16.1 (*) 09.6 - 46.0 %    MCV 72.9 (*) 78.0 - 100.0 fL    MCH 29.0  26.0 - 34.0 pg    MCHC 39.8 (*) 30.0 - 36.0 g/dL SICKLE CELLS   RDW 04.5 (*) 11.5 - 15.5 %    Platelets 269  150 - 400 K/uL PLATELET COUNT CONFIRMED BY SMEAR   Neutrophils Relative 63  43 - 77 %    Lymphocytes Relative 17  12 - 46 %    Monocytes Relative 14 (*) 3 - 12 %    Eosinophils Relative 5  0 - 5 %    Basophils Relative 1  0 - 1 %    Neutro Abs 7.6  1.7 - 7.7 K/uL    Lymphs Abs 2.0  0.7 - 4.0 K/uL    Monocytes Absolute 1.7 (*) 0.1 - 1.0 K/uL    Eosinophils Absolute 0.6  0.0 - 0.7 K/uL    Basophils Absolute 0.1  0.0 - 0.1 K/uL    RBC Morphology MARKED POLYCHROMASIA      WBC Morphology  MILD LEFT SHIFT (1-5% METAS, OCC MYELO, OCC BANDS)     COMPREHENSIVE METABOLIC  PANEL     Status: Abnormal   Collection Time   03/29/12 10:06 PM      Component Value Range Comment   Sodium 129 (*) 135 - 145 mEq/L    Potassium 3.4 (*) 3.5 - 5.1 mEq/L    Chloride 93 (*) 96 - 112 mEq/L    CO2 22  19 - 32 mEq/L    Glucose, Bld 105 (*) 70 - 99 mg/dL    BUN 6  6 - 23 mg/dL    Creatinine, Ser 7.82  0.50 - 1.10 mg/dL    Calcium 8.9  8.4 - 95.6 mg/dL    Total Protein 8.0  6.0 - 8.3 g/dL    Albumin 4.8  3.5 - 5.2 g/dL    AST 213 (*) 0 - 37 U/L    ALT 62 (*) 0 - 35 U/L    Alkaline Phosphatase 72  39 - 117 U/L    Total Bilirubin 5.2 (*) 0.3 - 1.2 mg/dL    GFR calc non Af Amer >90  >90 mL/min    GFR calc Af Amer >90  >90 mL/min   LIPASE, BLOOD     Status: Normal   Collection Time   03/29/12 10:06 PM      Component Value Range Comment   Lipase 30  11 - 59 U/L   POCT I-STAT TROPONIN I     Status: Normal   Collection Time   03/29/12 10:36 PM      Component Value Range Comment   Troponin i, poc 0.01  0.00 - 0.08 ng/mL    Comment 3            LACTIC ACID, PLASMA     Status: Normal   Collection Time   03/29/12 11:51 PM      Component Value Range Comment   Lactic Acid, Venous 1.0  0.5 - 2.2 mmol/L   RETICULOCYTES     Status: Abnormal   Collection Time   03/30/12 12:00 AM      Component Value Range Comment   Retic Ct Pct 17.0 (*) 0.4 - 3.1 % RESULTS CONFIRMED BY MANUAL DILUTION   RBC. 2.06 (*) 3.87 - 5.11 MIL/uL    Retic Count, Manual 350.2 (*) 19.0 - 186.0 K/uL   TYPE AND SCREEN     Status: Normal (Preliminary result)   Collection Time   03/30/12 12:17 AM      Component Value Range Comment   ABO/RH(D) B POS      Antibody Screen NEG      Sample Expiration 04/02/2012      Unit Number [id number]      Blood Component Type RED CELLS,LR      Unit division 00      Status of Unit ISSUED      Donor AG Type        Value: NEGATIVE FOR KELL ANTIGEN NEGATIVE FOR C ANTIGEN NEGATIVE FOR E ANTIGEN   Transfusion Status OK TO TRANSFUSE      Crossmatch Result Compatible       Unit Number [id number]      Blood Component Type RED CELLS,LR      Unit division 00      Status of Unit ALLOCATED      Donor AG Type        Value: NEGATIVE FOR KELL ANTIGEN NEGATIVE FOR C ANTIGEN NEGATIVE FOR E ANTIGEN  Transfusion Status OK TO TRANSFUSE      Crossmatch Result Compatible     PREPARE RBC (CROSSMATCH)     Status: Normal   Collection Time   03/30/12 12:18 AM      Component Value Range Comment   Order Confirmation ORDER PROCESSED BY BLOOD BANK     PREGNANCY, URINE     Status: Normal   Collection Time   03/30/12  3:40 AM      Component Value Range Comment   Preg Test, Ur NEGATIVE  NEGATIVE   CBC     Status: Abnormal   Collection Time   03/30/12  9:20 AM      Component Value Range Comment   WBC 11.4 (*) 4.0 - 10.5 K/uL    RBC 2.21 (*) 3.87 - 5.11 MIL/uL    Hemoglobin 6.3 (*) 12.0 - 15.0 g/dL CRITICAL VALUE NOTED.  VALUE IS CONSISTENT WITH PREVIOUSLY REPORTED AND CALLED VALUE.   HCT 16.4 (*) 36.0 - 46.0 %    MCV 74.2 (*) 78.0 - 100.0 fL    MCH 28.5  26.0 - 34.0 pg    MCHC 38.4 (*) 30.0 - 36.0 g/dL SICKLE CELLS   RDW 16.1 (*) 11.5 - 15.5 %    Platelets 209  150 - 400 K/uL PLATELET COUNT CONFIRMED BY SMEAR  COMPREHENSIVE METABOLIC PANEL     Status: Abnormal   Collection Time   03/30/12  9:20 AM      Component Value Range Comment   Sodium 139  135 - 145 mEq/L DELTA CHECK NOTED   Potassium 3.4 (*) 3.5 - 5.1 mEq/L    Chloride 104  96 - 112 mEq/L DELTA CHECK NOTED   CO2 22  19 - 32 mEq/L    Glucose, Bld 98  70 - 99 mg/dL    BUN 4 (*) 6 - 23 mg/dL    Creatinine, Ser 0.96 (*) 0.50 - 1.10 mg/dL    Calcium 7.3 (*) 8.4 - 10.5 mg/dL    Total Protein 6.1  6.0 - 8.3 g/dL    Albumin 3.4 (*) 3.5 - 5.2 g/dL    AST 045 (*) 0 - 37 U/L    ALT 44 (*) 0 - 35 U/L    Alkaline Phosphatase 49  39 - 117 U/L    Total Bilirubin 2.9 (*) 0.3 - 1.2 mg/dL    GFR calc non Af Amer >90  >90 mL/min    GFR calc Af Amer >90  >90 mL/min   MAGNESIUM     Status: Normal   Collection Time    03/30/12  9:20 AM      Component Value Range Comment   Magnesium 2.0  1.5 - 2.5 mg/dL     Studies/Results: Dg Chest 1 View  03/30/2012  *RADIOLOGY REPORT*  Clinical Data: Chest pain  CHEST - 1 VIEW  Comparison: 03/29/2012  Findings: The heart is moderately enlarged.  Vascular congestion has developed.  Lungs under aerated with bibasilar atelectasis versus mild edema.  No pneumothorax.  Small pleural effusions are suspected.  IMPRESSION: Small pleural effusions suspected with bibasilar atelectasis versus mild edema.   Original Report Authenticated By: Jolaine Click, M.D.    Dg Chest 2 View  03/29/2012  *RADIOLOGY REPORT*  Clinical Data: Fever and emesis.  History of sickle cell disease.  CHEST - 2 VIEW  Comparison: 07/08/2010  Findings: Heart size is mildly enlarged.  There is no pleural effusion identified.  The bilateral, upper lobe predominant interstitial prominence is likely  reflects scarring.  No superimposed airspace consolidation.  Bony changes of sickle cell disease noted.  IMPRESSION:  1.  No acute cardiopulmonary abnormalities noted.   Original Report Authenticated By: [name], M.D.    Dg Tibia/fibula Right  03/30/2012  *RADIOLOGY REPORT*  Clinical Data: Ulcer  RIGHT TIBIA AND FIBULA - 2 VIEW  Comparison: None.  Findings: No acute fracture and no dislocation.  No destructive bone lesion.  IMPRESSION: No acute bony pathology.   Original Report Authenticated By: Jolaine Click, M.D.    Dg Ankle Complete Right  03/30/2012  *RADIOLOGY REPORT*  Clinical Data: Medial ankle ulcer  RIGHT ANKLE - COMPLETE 3+ VIEW  Comparison: None.  Findings: No acute fracture or dislocation is noted.  No gross soft tissue abnormality is seen.  IMPRESSION: No acute abnormality noted.   Original Report Authenticated By: Alcide Clever, M.D.    US Abdomen Complete  03/30/2012  *RADIOLOGY REPORT*  Clinical Data:  Elevated LFTs, jaundice.  Nausea, vomiting.  COMPLETE ABDOMINAL ULTRASOUND  Comparison:  None.   Findings:  Gallbladder:  Multiple mobile gallstones within the gallbladder. No wall thickening.  Negative sonographic Murphy's.  Common bile duct:   Normal caliber, 3 mm.  Liver:  No focal lesion identified.  Within normal limits in parenchymal echogenicity.  IVC:  Appears normal.  Pancreas:  No focal abnormality seen.  Spleen:  Not definitively visualized.  Right Kidney:   Slightly increased echotexture.  13.8 cm.  No hydronephrosis or focal abnormality.  Left Kidney:  13.2 cm.  Slightly increased echotexture.  No hydronephrosis or focal abnormality.  Abdominal aorta:  No aneurysm identified.  IMPRESSION: Cholelithiasis.  No sonographic evidence of acute cholecystitis.   Original Report Authenticated By: Charlett Nose, M.D.    Dg Foot Complete Right  03/30/2012  *RADIOLOGY REPORT*  Clinical Data: Ulcer at the malleolus  RIGHT FOOT COMPLETE - 3+ VIEW  Comparison: None.  Findings: Mild pes planus deformity.  No acute fracture and no dislocation.  No destructive bone lesion.  IMPRESSION: No acute bony pathology.   Original Report Authenticated By: Jolaine Click, M.D.     Patient Active Problem List  Diagnosis  . Sickle cell disease  . Sickle cell anemia with pain  . Elevated LFTs  . Abdominal  pain, other specified site  . Nausea and vomiting in adult  . Cholelithiasis  . Anemia  . Leukocytosis  . Hypokalemia    Impression: Sickle cell crisis.  Hemoglobin SS disease. Acute chest syndrome. Cholelithiasis without cholecystitis. Mild hypokalemia. Non-healing leg ulcer. Rule out secondary infection. Elevated transaminases. Right lung pulmonary edema/pleural effusion.   Plan: To decrease IV fluids.  Change to hypotonic saline with potassium supplementation.  Incentive spirometry. 02 support as needed. Change to PCA Dilaudid. Followup CBC CMET in a.m. Exchange transfusions as needed. Empiric antibiotics. Wound care consultation. Surgical followup.  August Saucer, Mac Dowdell 03/30/2012 9:14 PM

## 2012-03-30 NOTE — Progress Notes (Signed)
Patient ID: [name], female   DOB: 1971/01/11, 41 y.o.   MRN: [id number]    Subjective: Pt still with some abd pain that radiates around back on both sides, +nausea but no vomiting.  Objective: Vital signs in last 24 hours: Temp:  [97.5 F (36.4 C)-100 F (37.8 C)] 97.5 F (36.4 C) (11/11 0745) Pulse Rate:  [53-86] 53  (11/11 0745) Resp:  [18-27] 18  (11/11 0745) BP: (90-121)/(44-74) 95/51 mmHg (11/11 0745) SpO2:  [94 %-99 %] 95 % (11/11 0341) Weight:  [122 lb 6.4 oz (55.52 kg)] 122 lb 6.4 oz (55.52 kg) (11/11 0341)    Intake/Output from previous day: 11/10 0701 - 11/11 0700 In: 253 [I.V.:253] Out: 475 [Urine:475] Intake/Output this shift:    PE: Abd: soft, tender along flanks bilateral, seems more tender on left side, +BS  Lab Results:   Basename 03/30/12 0920 03/29/12 2206  WBC 11.4* 12.0*  HGB 6.3* 6.4*  HCT 16.4* 16.1*  PLT 209 269   BMET  Basename 03/30/12 0920 03/29/12 2206  NA 139 129*  K 3.4* 3.4*  CL 104 93*  CO2 22 22  GLUCOSE 98 105*  BUN 4* 6  CREATININE 0.41* 0.51  CALCIUM 7.3* 8.9   PT/INR No results found for this basename: LABPROT:2,INR:2 in the last 72 hours CMP     Component Value Date/Time   NA 139 03/30/2012 0920   K 3.4* 03/30/2012 0920   CL 104 03/30/2012 0920   CO2 22 03/30/2012 0920   GLUCOSE 98 03/30/2012 0920   BUN 4* 03/30/2012 0920   CREATININE 0.41* 03/30/2012 0920   CALCIUM 7.3* 03/30/2012 0920   PROT 6.1 03/30/2012 0920   ALBUMIN 3.4* 03/30/2012 0920   AST 121* 03/30/2012 0920   ALT 44* 03/30/2012 0920   ALKPHOS 49 03/30/2012 0920   BILITOT 2.9* 03/30/2012 0920   GFRNONAA >90 03/30/2012 0920   GFRAA >90 03/30/2012 0920   Lipase     Component Value Date/Time   LIPASE 30 03/29/2012 2206       Studies/Results: Dg Chest 2 View  03/29/2012  *RADIOLOGY REPORT*  Clinical Data: Fever and emesis.  History of sickle cell disease.  CHEST - 2 VIEW  Comparison: 07/08/2010  Findings: Heart size is mildly enlarged.   There is no pleural effusion identified.  The bilateral, upper lobe predominant interstitial prominence is likely reflects scarring.  No superimposed airspace consolidation.  Bony changes of sickle cell disease noted.  IMPRESSION:  1.  No acute cardiopulmonary abnormalities noted.   Original Report Authenticated By: [name], M.D.    US Abdomen Complete  03/30/2012  *RADIOLOGY REPORT*  Clinical Data:  Elevated LFTs, jaundice.  Nausea, vomiting.  COMPLETE ABDOMINAL ULTRASOUND  Comparison:  None.  Findings:  Gallbladder:  Multiple mobile gallstones within the gallbladder. No wall thickening.  Negative sonographic Murphy's.  Common bile duct:   Normal caliber, 3 mm.  Liver:  No focal lesion identified.  Within normal limits in parenchymal echogenicity.  IVC:  Appears normal.  Pancreas:  No focal abnormality seen.  Spleen:  Not definitively visualized.  Right Kidney:   Slightly increased echotexture.  13.8 cm.  No hydronephrosis or focal abnormality.  Left Kidney:  13.2 cm.  Slightly increased echotexture.  No hydronephrosis or focal abnormality.  Abdominal aorta:  No aneurysm identified.  IMPRESSION: Cholelithiasis.  No sonographic evidence of acute cholecystitis.   Original Report Authenticated By: Charlett Nose, M.D.     Anti-infectives: Anti-infectives     Start  Dose/Rate Route Frequency Ordered Stop   03/30/12 1200   levofloxacin (LEVAQUIN) IVPB 750 mg        750 mg 100 mL/hr over 90 Minutes Intravenous Every 24 hours 03/30/12 1049             Assessment/Plan  1. Cholelithiasis:  does not appear to have any evidence of acute cholecystitis, in sickle cell crisis, bilirubin is trending down now, per Dr. Donell Beers we will see if her pain resolves as her sickle cell crisis improves. No need for acute surgical intervention.  Pt to be transferred to [facility].  Will have team follow her there.    LOS: 1 day    Aahan Marques 03/30/2012

## 2012-03-30 NOTE — ED Provider Notes (Addendum)
History     CSN: [id number]  Arrival date & time 03/29/12  2133   First MD Initiated Contact with Patient 03/29/12 2330      Chief Complaint  Patient presents with  . Emesis  . Fever    (Consider location/radiation/quality/duration/timing/severity/associated sxs/prior treatment) HPI 41 yo female presents to the ER with complaint of fever to 100.7, body aches, n/v/d, headache, abd pain radiation into chest and back.  She denies cough, sick contacts.  Husband reports she has not eaten in the last 2 days.  Pt with history of sickle cell disease, reports this does not feel like a sickle cell crisis as the pain is different in nature.  No prior h/o gallbladder disease.  Pt also c/o nonhealing ulcer to right inside ankle ongoing for several months.  Past Medical History  Diagnosis Date  . Sickle cell disease     Past Surgical History  Procedure Date  . Cesarean section     History reviewed. No pertinent family history.  History  Substance Use Topics  . Smoking status: Never Smoker   . Smokeless tobacco: Not on file  . Alcohol Use: No    OB History    Grav Para Term Preterm Abortions TAB SAB Ect Mult Living                  Review of Systems  All other systems reviewed and are negative.  other than listed in HPI  Allergies  Review of patient's allergies indicates no known allergies.  Home Medications   Current Outpatient Rx  Name  Route  Sig  Dispense  Refill  . ACETAMINOPHEN 325 MG PO TABS   Oral   Take 650 mg by mouth every 6 (six) hours as needed. For pain         . FOLIC ACID 1 MG PO TABS   Oral   Take 1 mg by mouth daily.           BP 121/67  Pulse 71  Temp 98.5 F (36.9 C) (Oral)  Resp 18  SpO2 98%  LMP 03/22/2012  Physical Exam  Nursing note and vitals reviewed. Constitutional: She is oriented to person, place, and time. She appears well-developed and well-nourished.       Uncomfortable, ill appearing  HENT:  Head: Normocephalic and  atraumatic.  Nose: Nose normal.       Dry mucous membranes  Eyes: Conjunctivae normal and EOM are normal. Pupils are equal, round, and reactive to light. Scleral icterus is present.  Neck: Normal range of motion. Neck supple. No JVD present. No tracheal deviation present. No thyromegaly present.  Cardiovascular: Normal rate, regular rhythm and intact distal pulses.  Exam reveals no gallop and no friction rub.   No murmur heard. Pulmonary/Chest: Effort normal and breath sounds normal. No stridor. No respiratory distress. She has no wheezes. She has no rales. She exhibits no tenderness.  Abdominal: Soft. Bowel sounds are normal. She exhibits no distension and no mass. There is tenderness (signficant tenderness over epigastrium and RUQ). There is no rebound and no guarding.  Musculoskeletal: Normal range of motion. She exhibits no edema and no tenderness.       Ulceration to right medial malleolus, no erythema, induration, fluctuance  Lymphadenopathy:    She has no cervical adenopathy.  Neurological: She is alert and oriented to person, place, and time. She exhibits normal muscle tone. Coordination normal.  Skin: Skin is warm and dry. No rash noted. No erythema. No  pallor.  Psychiatric: She has a normal mood and affect. Her behavior is normal. Judgment and thought content normal.    ED Course  Procedures (including critical care time)  CRITICAL CARE Performed by: Olivia Mackie   Total critical care time: 30  Critical care time was exclusive of separately billable procedures and treating other patients.  Critical care was necessary to treat or prevent imminent or life-threatening deterioration.  Critical care was time spent personally by me on the following activities: development of treatment plan with patient and/or surrogate as well as nursing, discussions with consultants, evaluation of patient's response to treatment, examination of patient, obtaining history from patient or surrogate,  ordering and performing treatments and interventions, ordering and review of laboratory studies, ordering and review of radiographic studies, pulse oximetry and re-evaluation of patient's condition. Labs Reviewed  CBC WITH DIFFERENTIAL - Abnormal; Notable for the following:    WBC 12.0 (*)     RBC 2.21 (*)     Hemoglobin 6.4 (*)     HCT 16.1 (*)     MCV 72.9 (*)     MCHC 39.8 (*)  SICKLE CELLS   RDW 22.9 (*)     Monocytes Relative 14 (*)     Monocytes Absolute 1.7 (*)     All other components within normal limits  COMPREHENSIVE METABOLIC PANEL - Abnormal; Notable for the following:    Sodium 129 (*)     Potassium 3.4 (*)     Chloride 93 (*)     Glucose, Bld 105 (*)     AST 175 (*)     ALT 62 (*)     Total Bilirubin 5.2 (*)     All other components within normal limits  RETICULOCYTES - Abnormal; Notable for the following:    Retic Ct Pct 17.0 (*)  RESULTS CONFIRMED BY MANUAL DILUTION   RBC. 2.06 (*)     Retic Count, Manual 350.2 (*)     All other components within normal limits  POCT I-STAT TROPONIN I  LACTIC ACID, PLASMA  CULTURE, BLOOD (ROUTINE X 2)  CULTURE, BLOOD (ROUTINE X 2)  TYPE AND SCREEN  PREPARE RBC (CROSSMATCH)   Dg Chest 2 View  03/29/2012  *RADIOLOGY REPORT*  Clinical Data: Fever and emesis.  History of sickle cell disease.  CHEST - 2 VIEW  Comparison: 07/08/2010  Findings: Heart size is mildly enlarged.  There is no pleural effusion identified.  The bilateral, upper lobe predominant interstitial prominence is likely reflects scarring.  No superimposed airspace consolidation.  Bony changes of sickle cell disease noted.  IMPRESSION:  1.  No acute cardiopulmonary abnormalities noted.   Original Report Authenticated By: [name], M.D.     Date: 03/27/2012  Rate: 76  Rhythm: normal sinus rhythm  QRS Axis: normal  Intervals: normal  ST/T Wave abnormalities: nonspecific ST/T changes  Conduction Disutrbances:none  Narrative Interpretation:   Old EKG Reviewed:  none available    1. Sickle cell crisis   2. Cholelithiasis   3. Hyponatremia   4. Anemia   5. Jaundice   6. Fever   7. Nausea vomiting and diarrhea       MDM  41 yo female with several days of N/v/d, fever starting yesterday, abd pain.  Labs show elevated retic count, hyponatremia, significant anemia, elevated ast/alt, bilirubin.  Concern for cholecystitis with sickle cell crisis.  Will get blood transfusion ordered, u/s abd, plan for admission        Olivia Mackie, MD  03/30/12 0154  Olivia Mackie, MD 07/01/12 1610  Olivia Mackie, MD 07/01/12 [phone]

## 2012-03-30 NOTE — Progress Notes (Signed)
Agree with above, patient transferred before I could see

## 2012-03-30 NOTE — Progress Notes (Signed)
I have seen and assessed the patient and agree with Dr. Onalee Hua assessment and plan. Patient complaining of diffuse pain in the chest abdomen back and legs. Lung exam with crackles in the bases. Patient is status post 1 unit of packed red blood cells. Patient does have a leukocytosis and due to her chest pain we'll place empirically on IV Levaquin for global acute chest syndrome as well. Decrease IV fluids to 125 cc per hour. Will repeat a chest x-ray. Will cycle cardiac enzymes every 6 hours x3. Will repeat plain films of the right lower extremity. Continue pain management. We'll place on a clear liquid diet. WIll place on full dose Lovenox. Follow for now. I have spoken with Dr. Willey Blade of the sickle cell clinic and he has agreed to take over care of the patient. We'll transfer patient to [facility] for further management. Follow.

## 2012-03-30 NOTE — H&P (Signed)
Chief Complaint:  abd pain/n/v  HPI: 41 yo female h/o sickle cell disease from [place] comes in with 4 days of n/v nonbloody with epi abd pain which is not typical of her sickle cell crisis pain.  She normally has back pain with joint pain in her upper ext and not abd pain.  No brbpr no diarrhea.  No fevers.  No dysuria.  No rashes.  Has not been hosp for over a year and a half.  Still has gallbladder.  Vomit is bilious.  Her baseline hgb is between 7-8.  It is down to 6.4.  She is normally not jaundiced except during acute crisis.    Review of Systems:  O/w neg  Past Medical History: Past Medical History  Diagnosis Date  . Sickle cell disease    Past Surgical History  Procedure Date  . Cesarean section     Medications: Prior to Admission medications   Medication Sig Start Date End Date Taking? Authorizing Provider  acetaminophen (TYLENOL) 325 MG tablet Take 650 mg by mouth every 6 (six) hours as needed. For pain   Yes Historical Provider, MD  folic acid (FOLVITE) 1 MG tablet Take 1 mg by mouth daily.   Yes Historical Provider, MD    Allergies:  No Known Allergies  Social History:  reports that she has never smoked. She does not have any smokeless tobacco history on file. She reports that she does not drink alcohol or use illicit drugs.  Family History: History reviewed. No pertinent family history.  Physical Exam: Filed Vitals:   03/29/12 2200 03/29/12 2323 03/30/12 0215  BP: 111/74 121/67 105/64  Pulse: 86 71 70  Temp: 99.5 F (37.5 C) 98.5 F (36.9 C)   TempSrc: Oral Oral   Resp: 18  27  SpO2: 94% 98% 99%   General appearance: alert, cooperative and no distress Eyes: icteric Neck: no JVD and supple, symmetrical, trachea midline Lungs: clear to auscultation bilaterally Heart: regular rate and rhythm, S1, S2 normal, no murmur, click, rub or gallop Abdomen: soft nd ttp epi area nd, pos bs no r/g nonacute abd Extremities: extremities normal, atraumatic, no  cyanosis or edema Pulses: 2+ and symmetric Skin: Skin color, texture, turgor normal. No rashes or lesions Neurologic: Grossly normal    Labs on Admission:   [facility] 03/29/12 2206  NA 129*  K 3.4*  CL 93*  CO2 22  GLUCOSE 105*  BUN 6  CREATININE 0.51  CALCIUM 8.9  MG --  PHOS --    Basename 03/29/12 2206  AST 175*  ALT 62*  ALKPHOS 72  BILITOT 5.2*  PROT 8.0  ALBUMIN 4.8    Basename 03/29/12 2206  WBC 12.0*  NEUTROABS 7.6  HGB 6.4*  HCT 16.1*  MCV 72.9*  PLT 269    Basename 03/30/12  VITAMINB12 --  FOLATE --  FERRITIN --  TIBC --  IRON --  RETICCTPCT 17.0*    Radiological Exams on Admission: Dg Chest 2 View  03/29/2012  *RADIOLOGY REPORT*  Clinical Data: Fever and emesis.  History of sickle cell disease.  CHEST - 2 VIEW  Comparison: 07/08/2010  Findings: Heart size is mildly enlarged.  There is no pleural effusion identified.  The bilateral, upper lobe predominant interstitial prominence is likely reflects scarring.  No superimposed airspace consolidation.  Bony changes of sickle cell disease noted.  IMPRESSION:  1.  No acute cardiopulmonary abnormalities noted.   Original Report Authenticated By: [name], M.D.    US Abdomen  Complete  03/30/2012  *RADIOLOGY REPORT*  Clinical Data:  Elevated LFTs, jaundice.  Nausea, vomiting.  COMPLETE ABDOMINAL ULTRASOUND  Comparison:  None.  Findings:  Gallbladder:  Multiple mobile gallstones within the gallbladder. No wall thickening.  Negative sonographic Murphy's.  Common bile duct:   Normal caliber, 3 mm.  Liver:  No focal lesion identified.  Within normal limits in parenchymal echogenicity.  IVC:  Appears normal.  Pancreas:  No focal abnormality seen.  Spleen:  Not definitively visualized.  Right Kidney:   Slightly increased echotexture.  13.8 cm.  No hydronephrosis or focal abnormality.  Left Kidney:  13.2 cm.  Slightly increased echotexture.  No hydronephrosis or focal abnormality.  Abdominal aorta:  No aneurysm  identified.  IMPRESSION: Cholelithiasis.  No sonographic evidence of acute cholecystitis.   Original Report Authenticated By: Charlett Nose, M.D.     Assessment/Plan 41 yo female with sickle cell crisis n/v/abd pain   Principal Problem:  *Sickle cell anemia with pain Active Problems:  Sickle cell disease  Elevated LFTs  Abdominal  pain, other specified site  Nausea and vomiting in adult  Cholelithiasis  Unclear if she has underlying intraabdominal process attributing to her current crisis.  General surgery has been consulted.  abd exam is nonacute but painful.  Transfuse and tx with ivf and iv dilaudid.  lfts elevated with normal alk phos.  abd u/s with cholelithiasis.  Repeat lfts in am.  Lipase normal.  No overt bleeding evident.  Seems to be well controlled sickle cell patient with report of no hospitalizations in year and a half.  From [place].  Full code.  Tele.    Arnika Larzelere A 03/30/2012, 2:50 AM

## 2012-03-30 NOTE — Progress Notes (Signed)
Pt c/o chest pain 8/10 scale.  Pt asymptomatic.  Bp 103/60, p 54, 02 100%.  Dilaudid 1mg  iv given.  Dr. Janee Morn made aware.  Will continue to monitor.  Katheryne Gorr,RN

## 2012-03-31 ENCOUNTER — Inpatient Hospital Stay (HOSPITAL_COMMUNITY): Payer: Medicaid - Out of State

## 2012-03-31 LAB — CBC WITH DIFFERENTIAL/PLATELET
Eosinophils Absolute: 0.5 10*3/uL (ref 0.0–0.7)
Lymphs Abs: 2.5 10*3/uL (ref 0.7–4.0)
MCH: 28.4 pg (ref 26.0–34.0)
MCHC: 38.2 g/dL — ABNORMAL HIGH (ref 30.0–36.0)
MCV: 74.4 fL — ABNORMAL LOW (ref 78.0–100.0)
Monocytes Absolute: 1.5 10*3/uL — ABNORMAL HIGH (ref 0.1–1.0)
Neutrophils Relative %: 60 % (ref 43–77)
Platelets: 196 10*3/uL (ref 150–400)

## 2012-03-31 LAB — TYPE AND SCREEN: Unit division: 0

## 2012-03-31 LAB — HEPATIC FUNCTION PANEL
ALT: 38 U/L — ABNORMAL HIGH (ref 0–35)
AST: 95 U/L — ABNORMAL HIGH (ref 0–37)
Albumin: 3.5 g/dL (ref 3.5–5.2)
Alkaline Phosphatase: 51 U/L (ref 39–117)
Bilirubin, Direct: 0.7 mg/dL — ABNORMAL HIGH (ref 0.0–0.3)
Total Bilirubin: 2.8 mg/dL — ABNORMAL HIGH (ref 0.3–1.2)

## 2012-03-31 LAB — AMYLASE: Amylase: 62 U/L (ref 0–105)

## 2012-03-31 LAB — BASIC METABOLIC PANEL
BUN: 3 mg/dL — ABNORMAL LOW (ref 6–23)
CO2: 26 mEq/L (ref 19–32)
Chloride: 105 mEq/L (ref 96–112)
Glucose, Bld: 105 mg/dL — ABNORMAL HIGH (ref 70–99)
Potassium: 3.9 mEq/L (ref 3.5–5.1)

## 2012-03-31 LAB — RETICULOCYTES: Retic Count, Absolute: 274.3 10*3/uL — ABNORMAL HIGH (ref 19.0–186.0)

## 2012-03-31 MED ORDER — DIPHENHYDRAMINE HCL 25 MG PO CAPS
25.0000 mg | ORAL_CAPSULE | Freq: Once | ORAL | Status: AC
  Administered 2012-03-31: 25 mg via ORAL
  Filled 2012-03-31: qty 1

## 2012-03-31 MED ORDER — KCL IN DEXTROSE-NACL 20-5-0.45 MEQ/L-%-% IV SOLN
INTRAVENOUS | Status: DC
  Administered 2012-03-31 – 2012-04-05 (×11): via INTRAVENOUS
  Administered 2012-04-06: 1000 mL via INTRAVENOUS
  Filled 2012-03-31 (×16): qty 1000

## 2012-03-31 MED ORDER — PROMETHAZINE HCL 25 MG PO TABS
25.0000 mg | ORAL_TABLET | ORAL | Status: DC | PRN
  Administered 2012-03-31: 25 mg via ORAL
  Filled 2012-03-31: qty 1

## 2012-03-31 MED ORDER — ACETAMINOPHEN 325 MG PO TABS
650.0000 mg | ORAL_TABLET | Freq: Once | ORAL | Status: AC
  Administered 2012-03-31: 650 mg via ORAL
  Filled 2012-03-31: qty 2

## 2012-03-31 MED ORDER — PROMETHAZINE HCL 25 MG RE SUPP: 25.0000 mg | RECTAL | Status: DC | PRN

## 2012-03-31 NOTE — Progress Notes (Signed)
Subjective:  Surgical followup appreciated. Patient tonight reports feeling much better. She denies significant pain at this time. Transient nausea earlier today. This is resolved. She has tried some full liquids as well. Her only pain has been in her right foot this evening. This has improved as well. Patient received one unit of packed RBCs which he tolerated well. She has not used her incentive spirometry  as often as recommended.   No Known Allergies Current Facility-Administered Medications  Medication Dose Route Frequency Provider Last Rate Last Dose  . [COMPLETED] acetaminophen (TYLENOL) tablet 650 mg  650 mg Oral Once Gwenyth Bender, MD   650 mg at 03/31/12 1346  . antiseptic oral rinse (BIOTENE) solution 15 mL  15 mL Mouth Rinse BID Tarry Kos, MD   15 mL at 03/31/12 0800  . dextrose 5 % and 0.45 % NaCl with KCl 20 mEq/L infusion   Intravenous Continuous Gwenyth Bender, MD 100 mL/hr at 03/31/12 1425    . diphenhydrAMINE (BENADRYL) injection 12.5 mg  12.5 mg Intravenous Q6H PRN Gwenyth Bender, MD       Or  . diphenhydrAMINE (BENADRYL) 12.5 MG/5ML elixir 12.5 mg  12.5 mg Oral Q6H PRN Gwenyth Bender, MD      . [address] diphenhydrAMINE (BENADRYL) capsule 25 mg  25 mg Oral Once Gwenyth Bender, MD   25 mg at 03/31/12 1346  . docusate sodium (COLACE) capsule 100 mg  100 mg Oral BID Rodolph Bong, MD   100 mg at 03/31/12 1112  . enoxaparin (LOVENOX) injection 55 mg  55 mg Subcutaneous Q12H Gwenyth Bender, MD   55 mg at 03/31/12 1310  . folic acid (FOLVITE) tablet 1 mg  1 mg Oral Daily Tarry Kos, MD   1 mg at 03/31/12 1112  . HYDROmorphone (DILAUDID) injection 2 mg  2 mg Intravenous Q2H PRN Gwenyth Bender, MD      . HYDROmorphone (DILAUDID) PCA injection 0.3 mg/mL   Intravenous Q4H Gwenyth Bender, MD      . ketorolac (TORADOL) 30 MG/ML injection 30 mg  30 mg Intravenous Q6H PRN Rodolph Bong, MD   30 mg at 03/30/12 2024  . levofloxacin (LEVAQUIN) IVPB 750 mg  750 mg Intravenous Q24H Rodolph Bong,  MD   750 mg at 03/31/12 1305  . naloxone [facility]) injection 0.4 mg  0.4 mg Intravenous PRN Gwenyth Bender, MD       And  . sodium chloride 0.9 % injection 9 mL  9 mL Intravenous PRN Gwenyth Bender, MD      . ondansetron [facility]) tablet 4 mg  4 mg Oral Q6H PRN Tarry Kos, MD       Or  . ondansetron [facility]) injection 4 mg  4 mg Intravenous Q6H PRN Tarry Kos, MD   4 mg at 03/31/12 1121  . ondansetron (ZOFRAN) injection 4 mg  4 mg Intravenous Q6H PRN Gwenyth Bender, MD      . pantoprazole (PROTONIX) injection 40 mg  40 mg Intravenous Q24H Rodolph Bong, MD   40 mg at 03/31/12 1112  . promethazine (PHENERGAN) tablet 25 mg  25 mg Oral Q4H PRN Gwenyth Bender, MD   25 mg at 03/31/12 1236   Or  . promethazine (PHENERGAN) suppository 25 mg  25 mg Rectal Q4H PRN Gwenyth Bender, MD      . senna-docusate (Senokot-S) tablet 1-2 tablet  1-2 tablet Oral QHS PRN Rodolph Bong, MD      .  sodium chloride 0.9 % injection 3 mL  3 mL Intravenous Q12H Tarry Kos, MD   3 mL at 03/31/12 1112  . [DISCONTINUED] 0.45 % NaCl with KCl 20 mEq / L infusion   Intravenous Continuous Gwenyth Bender, MD 50 mL/hr at 03/30/12 2124    . [DISCONTINUED] HYDROmorphone (DILAUDID) injection 2 mg  2 mg Intravenous Q4H PRN Tarry Kos, MD   2 mg at 03/30/12 1910    Objective: Blood pressure 116/60, pulse 60, temperature 98 F (36.7 C), temperature source Oral, resp. rate 16, height 5\' 7"  (1.702 m), weight 124 lb 1.9 oz (56.3 kg), last menstrual period 03/22/2012, SpO2 98.00%.  Well-developed well-nourished African female in no acute distress. HEENT: No sinus tenderness. No sclera icterus. NECK: No posterior cervical nodes. LUNGS: Clearer today. No vocal fremitus. No CVA tenderness. CV: Normal S1, S2 without S3. ABD: Positive bowel sounds. Soft, nontender. MSK: Negative Homans. No edema. Right ankle ulcer covered. NEURO: Intact.  Lab results: Results for orders placed during the hospital encounter of 03/29/12 (from the past 48  hour(s))  CBC WITH DIFFERENTIAL     Status: Abnormal   Collection Time   03/29/12 10:06 PM      Component Value Range Comment   WBC 12.0 (*) 4.0 - 10.5 K/uL    RBC 2.21 (*) 3.87 - 5.11 MIL/uL    Hemoglobin 6.4 (*) 12.0 - 15.0 g/dL    HCT 40.9 (*) 81.1 - 46.0 %    MCV 72.9 (*) 78.0 - 100.0 fL    MCH 29.0  26.0 - 34.0 pg    MCHC 39.8 (*) 30.0 - 36.0 g/dL SICKLE CELLS   RDW 91.4 (*) 11.5 - 15.5 %    Platelets 269  150 - 400 K/uL PLATELET COUNT CONFIRMED BY SMEAR   Neutrophils Relative 63  43 - 77 %    Lymphocytes Relative 17  12 - 46 %    Monocytes Relative 14 (*) 3 - 12 %    Eosinophils Relative 5  0 - 5 %    Basophils Relative 1  0 - 1 %    Neutro Abs 7.6  1.7 - 7.7 K/uL    Lymphs Abs 2.0  0.7 - 4.0 K/uL    Monocytes Absolute 1.7 (*) 0.1 - 1.0 K/uL    Eosinophils Absolute 0.6  0.0 - 0.7 K/uL    Basophils Absolute 0.1  0.0 - 0.1 K/uL    RBC Morphology MARKED POLYCHROMASIA      WBC Morphology MILD LEFT SHIFT (1-5% METAS, OCC MYELO, OCC BANDS)     COMPREHENSIVE METABOLIC PANEL     Status: Abnormal   Collection Time   03/29/12 10:06 PM      Component Value Range Comment   Sodium 129 (*) 135 - 145 mEq/L    Potassium 3.4 (*) 3.5 - 5.1 mEq/L    Chloride 93 (*) 96 - 112 mEq/L    CO2 22  19 - 32 mEq/L    Glucose, Bld 105 (*) 70 - 99 mg/dL    BUN 6  6 - 23 mg/dL    Creatinine, Ser 7.82  0.50 - 1.10 mg/dL    Calcium 8.9  8.4 - 95.6 mg/dL    Total Protein 8.0  6.0 - 8.3 g/dL    Albumin 4.8  3.5 - 5.2 g/dL    AST 213 (*) 0 - 37 U/L    ALT 62 (*) 0 - 35 U/L    Alkaline Phosphatase 72  39 -  117 U/L    Total Bilirubin 5.2 (*) 0.3 - 1.2 mg/dL    GFR calc non Af Amer >90  >90 mL/min    GFR calc Af Amer >90  >90 mL/min   LIPASE, BLOOD     Status: Normal   Collection Time   03/29/12 10:06 PM      Component Value Range Comment   Lipase 30  11 - 59 U/L   POCT I-STAT TROPONIN I     Status: Normal   Collection Time   03/29/12 10:36 PM      Component Value Range Comment   Troponin i, poc  0.01  0.00 - 0.08 ng/mL    Comment 3            LACTIC ACID, PLASMA     Status: Normal   Collection Time   03/29/12 11:51 PM      Component Value Range Comment   Lactic Acid, Venous 1.0  0.5 - 2.2 mmol/L   CULTURE, BLOOD (ROUTINE X 2)     Status: Normal (Preliminary result)   Collection Time   03/29/12 11:58 PM      Component Value Range Comment   Specimen Description BLOOD RIGHT FOREARM      Special Requests BOTTLES DRAWN AEROBIC ONLY 6CC      Culture  Setup Time 03/30/2012 09:17      Culture        Value:        BLOOD CULTURE RECEIVED NO GROWTH TO DATE CULTURE WILL BE HELD FOR 5 DAYS BEFORE ISSUING A FINAL NEGATIVE REPORT   Report Status PENDING     RETICULOCYTES     Status: Abnormal   Collection Time   03/30/12 12:00 AM      Component Value Range Comment   Retic Ct Pct 17.0 (*) 0.4 - 3.1 % RESULTS CONFIRMED BY MANUAL DILUTION   RBC. 2.06 (*) 3.87 - 5.11 MIL/uL    Retic Count, Manual 350.2 (*) 19.0 - 186.0 K/uL   CULTURE, BLOOD (ROUTINE X 2)     Status: Normal (Preliminary result)   Collection Time   03/30/12 12:05 AM      Component Value Range Comment   Specimen Description BLOOD LEFT FOREARM      Special Requests BOTTLES DRAWN AEROBIC ONLY 10CC      Culture  Setup Time 03/30/2012 09:16      Culture        Value:        BLOOD CULTURE RECEIVED NO GROWTH TO DATE CULTURE WILL BE HELD FOR 5 DAYS BEFORE ISSUING A FINAL NEGATIVE REPORT   Report Status PENDING     TYPE AND SCREEN     Status: Normal   Collection Time   03/30/12 12:17 AM      Component Value Range Comment   ABO/RH(D) B POS      Antibody Screen NEG      Sample Expiration 04/02/2012      Unit Number [id number]      Blood Component Type RED CELLS,LR      Unit division 00      Status of Unit ISSUED,FINAL      Donor AG Type        Value: NEGATIVE FOR KELL ANTIGEN NEGATIVE FOR C ANTIGEN NEGATIVE FOR E ANTIGEN   Transfusion Status OK TO TRANSFUSE      Crossmatch Result Compatible      Unit Number [id number]       Blood Component Type  RED CELLS,LR      Unit division 00      Status of Unit REL FROM [facility]      Donor AG Type        Value: NEGATIVE FOR KELL ANTIGEN NEGATIVE FOR C ANTIGEN NEGATIVE FOR E ANTIGEN   Transfusion Status OK TO TRANSFUSE      Crossmatch Result Compatible     PREPARE RBC (CROSSMATCH)     Status: Normal   Collection Time   03/30/12 12:18 AM      Component Value Range Comment   Order Confirmation ORDER PROCESSED BY BLOOD BANK     PREGNANCY, URINE     Status: Normal   Collection Time   03/30/12  3:40 AM      Component Value Range Comment   Preg Test, Ur NEGATIVE  NEGATIVE   CBC     Status: Abnormal   Collection Time   03/30/12  9:20 AM      Component Value Range Comment   WBC 11.4 (*) 4.0 - 10.5 K/uL    RBC 2.21 (*) 3.87 - 5.11 MIL/uL    Hemoglobin 6.3 (*) 12.0 - 15.0 g/dL CRITICAL VALUE NOTED.  VALUE IS CONSISTENT WITH PREVIOUSLY REPORTED AND CALLED VALUE.   HCT 16.4 (*) 36.0 - 46.0 %    MCV 74.2 (*) 78.0 - 100.0 fL    MCH 28.5  26.0 - 34.0 pg    MCHC 38.4 (*) 30.0 - 36.0 g/dL SICKLE CELLS   RDW 16.1 (*) 11.5 - 15.5 %    Platelets 209  150 - 400 K/uL PLATELET COUNT CONFIRMED BY SMEAR  COMPREHENSIVE METABOLIC PANEL     Status: Abnormal   Collection Time   03/30/12  9:20 AM      Component Value Range Comment   Sodium 139  135 - 145 mEq/L DELTA CHECK NOTED   Potassium 3.4 (*) 3.5 - 5.1 mEq/L    Chloride 104  96 - 112 mEq/L DELTA CHECK NOTED   CO2 22  19 - 32 mEq/L    Glucose, Bld 98  70 - 99 mg/dL    BUN 4 (*) 6 - 23 mg/dL    Creatinine, Ser 0.96 (*) 0.50 - 1.10 mg/dL    Calcium 7.3 (*) 8.4 - 10.5 mg/dL    Total Protein 6.1  6.0 - 8.3 g/dL    Albumin 3.4 (*) 3.5 - 5.2 g/dL    AST 045 (*) 0 - 37 U/L    ALT 44 (*) 0 - 35 U/L    Alkaline Phosphatase 49  39 - 117 U/L    Total Bilirubin 2.9 (*) 0.3 - 1.2 mg/dL    GFR calc non Af Amer >90  >90 mL/min    GFR calc Af Amer >90  >90 mL/min   MAGNESIUM     Status: Normal   Collection Time   03/30/12  9:20 AM       Component Value Range Comment   Magnesium 2.0  1.5 - 2.5 mg/dL   HIGH SENSITIVITY CRP     Status: Abnormal   Collection Time   03/30/12  8:56 PM      Component Value Range Comment   CRP, High Sensitivity 14.3 (*)    URINALYSIS, ROUTINE W REFLEX MICROSCOPIC     Status: Abnormal   Collection Time   03/30/12  9:51 PM      Component Value Range Comment   Color, Urine AMBER (*) YELLOW BIOCHEMICALS MAY BE AFFECTED BY COLOR  APPearance TURBID (*) CLEAR    Specific Gravity, Urine 1.014  1.005 - 1.030    pH 5.5  5.0 - 8.0    Glucose, UA NEGATIVE  NEGATIVE mg/dL    Hgb urine dipstick LARGE (*) NEGATIVE    Bilirubin Urine SMALL (*) NEGATIVE    Ketones, ur 15 (*) NEGATIVE mg/dL    Protein, ur >409 (*) NEGATIVE mg/dL    Urobilinogen, UA 1.0  0.0 - 1.0 mg/dL    Nitrite NEGATIVE  NEGATIVE    Leukocytes, UA NEGATIVE  NEGATIVE   URINE MICROSCOPIC-ADD ON     Status: Abnormal   Collection Time   03/30/12  9:51 PM      Component Value Range Comment   Squamous Epithelial / LPF FEW (*) RARE    RBC / HPF 11-20  <3 RBC/hpf    Bacteria, UA FEW (*) RARE    Casts GRANULAR CAST (*) NEGATIVE    Urine-Other MUCOUS PRESENT     BASIC METABOLIC PANEL     Status: Abnormal   Collection Time   03/31/12  4:00 AM      Component Value Range Comment   Sodium 138  135 - 145 mEq/L    Potassium 3.9  3.5 - 5.1 mEq/L    Chloride 105  96 - 112 mEq/L    CO2 26  19 - 32 mEq/L    Glucose, Bld 105 (*) 70 - 99 mg/dL    BUN 3 (*) 6 - 23 mg/dL    Creatinine, Ser 8.11 (*) 0.50 - 1.10 mg/dL    Calcium 7.4 (*) 8.4 - 10.5 mg/dL    GFR calc non Af Amer >90  >90 mL/min    GFR calc Af Amer >90  >90 mL/min   HEPATIC FUNCTION PANEL     Status: Abnormal   Collection Time   03/31/12  4:00 AM      Component Value Range Comment   Total Protein 6.4  6.0 - 8.3 g/dL    Albumin 3.5  3.5 - 5.2 g/dL    AST 95 (*) 0 - 37 U/L    ALT 38 (*) 0 - 35 U/L    Alkaline Phosphatase 51  39 - 117 U/L    Total Bilirubin 2.8 (*) 0.3 - 1.2 mg/dL     Bilirubin, Direct 0.7 (*) 0.0 - 0.3 mg/dL    Indirect Bilirubin 2.1 (*) 0.3 - 0.9 mg/dL   CBC WITH DIFFERENTIAL     Status: Abnormal   Collection Time   03/31/12  4:00 AM      Component Value Range Comment   WBC 11.9 (*) 4.0 - 10.5 K/uL    RBC 2.11 (*) 3.87 - 5.11 MIL/uL    Hemoglobin 6.0 (*) 12.0 - 15.0 g/dL    HCT 91.4 (*) 78.2 - 46.0 %    MCV 74.4 (*) 78.0 - 100.0 fL    MCH 28.4  26.0 - 34.0 pg    MCHC 38.2 (*) 30.0 - 36.0 g/dL SICKLE CELLS   RDW 95.6 (*) 11.5 - 15.5 %    Platelets 196  150 - 400 K/uL    Neutrophils Relative 60  43 - 77 %    Lymphocytes Relative 21  12 - 46 %    Monocytes Relative 13 (*) 3 - 12 %    Eosinophils Relative 4  0 - 5 %    Basophils Relative 2 (*) 0 - 1 %    Neutro Abs 7.2  1.7 -  7.7 K/uL    Lymphs Abs 2.5  0.7 - 4.0 K/uL    Monocytes Absolute 1.5 (*) 0.1 - 1.0 K/uL    Eosinophils Absolute 0.5  0.0 - 0.7 K/uL    Basophils Absolute 0.2 (*) 0.0 - 0.1 K/uL    RBC Morphology TARGET CELLS   BASOPHILIC STIPPLING   WBC Morphology MILD LEFT SHIFT (1-5% METAS, OCC MYELO, OCC BANDS)     MAGNESIUM     Status: Normal   Collection Time   03/31/12  4:00 AM      Component Value Range Comment   Magnesium 2.3  1.5 - 2.5 mg/dL   RETICULOCYTES     Status: Abnormal   Collection Time   03/31/12  4:00 AM      Component Value Range Comment   Retic Ct Pct 13.0 (*) 0.4 - 3.1 % RESULTS CONFIRMED BY MANUAL DILUTION   RBC. 2.11 (*) 3.87 - 5.11 MIL/uL    Retic Count, Manual 274.3 (*) 19.0 - 186.0 K/uL   AMYLASE     Status: Normal   Collection Time   03/31/12  4:00 AM      Component Value Range Comment   Amylase 62  0 - 105 U/L   PREPARE RBC (CROSSMATCH)     Status: Normal   Collection Time   03/31/12  9:30 AM      Component Value Range Comment   Order Confirmation ORDER PROCESSED BY BLOOD BANK     TYPE AND SCREEN     Status: Normal (Preliminary result)   Collection Time   03/31/12  9:46 AM      Component Value Range Comment   ABO/RH(D) B POS      Antibody  Screen NEG      Sample Expiration 04/03/2012      Unit Number [id number]      Blood Component Type RED CELLS,LR      Unit division 00      Status of Unit ISSUED      Transfusion Status OK TO TRANSFUSE      Crossmatch Result Compatible      Unit Number [id number]      Blood Component Type RED CELLS,LR      Unit division 00      Status of Unit ALLOCATED      Transfusion Status OK TO TRANSFUSE      Crossmatch Result Compatible     ABO/RH     Status: Normal   Collection Time   03/31/12  9:46 AM      Component Value Range Comment   ABO/RH(D) B POS       Studies/Results: Dg Chest 1 View  03/30/2012  *RADIOLOGY REPORT*  Clinical Data: Chest pain  CHEST - 1 VIEW  Comparison: 03/29/2012  Findings: The heart is moderately enlarged.  Vascular congestion has developed.  Lungs under aerated with bibasilar atelectasis versus mild edema.  No pneumothorax.  Small pleural effusions are suspected.  IMPRESSION: Small pleural effusions suspected with bibasilar atelectasis versus mild edema.   Original Report Authenticated By: Jolaine Click, M.D.    Dg Chest 2 View  03/29/2012  *RADIOLOGY REPORT*  Clinical Data: Fever and emesis.  History of sickle cell disease.  CHEST - 2 VIEW  Comparison: 07/08/2010  Findings: Heart size is mildly enlarged.  There is no pleural effusion identified.  The bilateral, upper lobe predominant interstitial prominence is likely reflects scarring.  No superimposed airspace consolidation.  Bony changes of sickle cell disease noted.  IMPRESSION:  1.  No acute cardiopulmonary abnormalities noted.   Original Report Authenticated By: [name], M.D.    Dg Tibia/fibula Right  03/30/2012  *RADIOLOGY REPORT*  Clinical Data: Ulcer  RIGHT TIBIA AND FIBULA - 2 VIEW  Comparison: None.  Findings: No acute fracture and no dislocation.  No destructive bone lesion.  IMPRESSION: No acute bony pathology.   Original Report Authenticated By: Jolaine Click, M.D.    Dg Ankle Complete  Right  03/30/2012  *RADIOLOGY REPORT*  Clinical Data: Medial ankle ulcer  RIGHT ANKLE - COMPLETE 3+ VIEW  Comparison: None.  Findings: No acute fracture or dislocation is noted.  No gross soft tissue abnormality is seen.  IMPRESSION: No acute abnormality noted.   Original Report Authenticated By: Alcide Clever, M.D.    US Abdomen Complete  03/30/2012  *RADIOLOGY REPORT*  Clinical Data:  Elevated LFTs, jaundice.  Nausea, vomiting.  COMPLETE ABDOMINAL ULTRASOUND  Comparison:  None.  Findings:  Gallbladder:  Multiple mobile gallstones within the gallbladder. No wall thickening.  Negative sonographic Murphy's.  Common bile duct:   Normal caliber, 3 mm.  Liver:  No focal lesion identified.  Within normal limits in parenchymal echogenicity.  IVC:  Appears normal.  Pancreas:  No focal abnormality seen.  Spleen:  Not definitively visualized.  Right Kidney:   Slightly increased echotexture.  13.8 cm.  No hydronephrosis or focal abnormality.  Left Kidney:  13.2 cm.  Slightly increased echotexture.  No hydronephrosis or focal abnormality.  Abdominal aorta:  No aneurysm identified.  IMPRESSION: Cholelithiasis.  No sonographic evidence of acute cholecystitis.   Original Report Authenticated By: Charlett Nose, M.D.    Dg Abd Portable 1v  03/31/2012  *RADIOLOGY REPORT*  Clinical Data: Nausea, vomiting, abdominal pain, sickle cell disease  PORTABLE ABDOMEN - 1 VIEW  Comparison: None.  Findings: Normal bowel gas pattern without obstruction or ileus. No abnormal calcifications or mass effect.  Pelvic calcifications consistent with venous phleboliths on the left.  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  IMPRESSION: Normal bowel gas pattern.   Original Report Authenticated By: Judie Petit. Shick, M.D.    Dg Foot Complete Right  03/30/2012  *RADIOLOGY REPORT*  Clinical Data: Ulcer at the malleolus  RIGHT FOOT COMPLETE - 3+ VIEW  Comparison: None.  Findings: Mild pes planus  deformity.  No acute fracture and no dislocation.  No destructive bone lesion.  IMPRESSION: No acute bony pathology.   Original Report Authenticated By: Jolaine Click, M.D.     Patient Active Problem List  Diagnosis  . Sickle cell disease  . Sickle cell anemia with pain  . Elevated LFTs  . Abdominal  pain, other specified site  . Nausea and vomiting in adult  . Cholelithiasis  . Anemia  . Leukocytosis  . Hypokalemia    Impression: Sickle cell crisis. Improving. Acute chest syndrome, improved. Cholelithiasis without cholecystitis. Surgical followup appreciated. Progressive anemia secondary to #1. Patient transfuse one unit packed RBCs today. Leukocytosis. 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 Hypocalcemia. Rule out vitamin D deficiency. Check magnesium as well.   Plan: Continue supportive measures. IV fluids with potassium supplementation, continue Dilaudid PCA. Encourage incentive spirometry use walking. Followup CBC, CMET in a.m. Vitamin D level. Exchange transfusion as indicated.   August Saucer, [place] Sharma 03/31/2012 8:06 PM

## 2012-03-31 NOTE — Progress Notes (Signed)
CRITICAL VALUE ALERT  Critical value received:  HGB 6.0  Date of notification:  03/31/12  Time of notification:  0425  Critical value read back:YES Nurse who received alert:  Jilda Panda  MD notified (1st page):  August Saucer, ERIC  Time of first page:  0430  MD notified (2nd page):  Time of second page:  Responding MD:  August Saucer ,ERIC  Time MD responded:  [phone]

## 2012-03-31 NOTE — Progress Notes (Signed)
Subjective: Has some abdominal pain, but pain in ankle worse.  Using PCA frequently.  Objective: Vital signs in last 24 hours: Temp:  [97.6 F (36.4 C)-97.9 F (36.6 C)] 97.6 F (36.4 C) (11/12 0622) Pulse Rate:  [48-85] 58  (11/12 0622) Resp:  [15-18] 16  (11/12 0836) BP: (97-115)/(52-61) 115/61 mmHg (11/12 0622) SpO2:  [93 %-100 %] 100 % (11/12 0836) Weight:  [124 lb 1.9 oz (56.3 kg)] 124 lb 1.9 oz (56.3 kg) (11/11 1406)    Intake/Output from previous day: 11/11 0701 - 11/12 0700 In: 573.3 [P.O.:100; I.V.:448; IV Piggyback:25.3] Out: 200 [Urine:200] Intake/Output this shift:    General appearance: alert, cooperative and no distress GI: soft, complains of abd pain, no nausea, not worse with palpation.  Lab Results:   Basename 03/31/12 0400 03/30/12 0920  WBC 11.9* 11.4*  HGB 6.0* 6.3*  HCT 15.7* 16.4*  PLT 196 209    BMET  Basename 03/31/12 0400 03/30/12 0920  NA 138 139  K 3.9 3.4*  CL 105 104  CO2 26 22  GLUCOSE 105* 98  BUN 3* 4*  CREATININE 0.39* 0.41*  CALCIUM 7.4* 7.3*   PT/INR No results found for this basename: LABPROT:2,INR:2 in the last 72 hours   Lab 03/31/12 0400 03/30/12 0920 03/29/12 2206  AST 95* 121* 175*  ALT 38* 44* 62*  ALKPHOS 51 49 72  BILITOT 2.8* 2.9* 5.2*  PROT 6.4 6.1 8.0  ALBUMIN 3.5 3.4* 4.8     Lipase     Component Value Date/Time   LIPASE 30 03/29/2012 2206     Studies/Results: Dg Chest 1 View  03/30/2012  *RADIOLOGY REPORT*  Clinical Data: Chest pain  CHEST - 1 VIEW  Comparison: 03/29/2012  Findings: The heart is moderately enlarged.  Vascular congestion has developed.  Lungs under aerated with bibasilar atelectasis versus mild edema.  No pneumothorax.  Small pleural effusions are suspected.  IMPRESSION: Small pleural effusions suspected with bibasilar atelectasis versus mild edema.   Original Report Authenticated By: Jolaine Click, M.D.    Dg Chest 2 View  03/29/2012  *RADIOLOGY REPORT*  Clinical Data: Fever  and emesis.  History of sickle cell disease.  CHEST - 2 VIEW  Comparison: 07/08/2010  Findings: Heart size is mildly enlarged.  There is no pleural effusion identified.  The bilateral, upper lobe predominant interstitial prominence is likely reflects scarring.  No superimposed airspace consolidation.  Bony changes of sickle cell disease noted.  IMPRESSION:  1.  No acute cardiopulmonary abnormalities noted.   Original Report Authenticated By: [name], M.D.    Dg Tibia/fibula Right  03/30/2012  *RADIOLOGY REPORT*  Clinical Data: Ulcer  RIGHT TIBIA AND FIBULA - 2 VIEW  Comparison: None.  Findings: No acute fracture and no dislocation.  No destructive bone lesion.  IMPRESSION: No acute bony pathology.   Original Report Authenticated By: Jolaine Click, M.D.    Dg Ankle Complete Right  03/30/2012  *RADIOLOGY REPORT*  Clinical Data: Medial ankle ulcer  RIGHT ANKLE - COMPLETE 3+ VIEW  Comparison: None.  Findings: No acute fracture or dislocation is noted.  No gross soft tissue abnormality is seen.  IMPRESSION: No acute abnormality noted.   Original Report Authenticated By: Alcide Clever, M.D.    US Abdomen Complete  03/30/2012  *RADIOLOGY REPORT*  Clinical Data:  Elevated LFTs, jaundice.  Nausea, vomiting.  COMPLETE ABDOMINAL ULTRASOUND  Comparison:  None.  Findings:  Gallbladder:  Multiple mobile gallstones within the gallbladder. No wall thickening.  Negative sonographic Murphy's.  Common bile duct:   Normal caliber, 3 mm.  Liver:  No focal lesion identified.  Within normal limits in parenchymal echogenicity.  IVC:  Appears normal.  Pancreas:  No focal abnormality seen.  Spleen:  Not definitively visualized.  Right Kidney:   Slightly increased echotexture.  13.8 cm.  No hydronephrosis or focal abnormality.  Left Kidney:  13.2 cm.  Slightly increased echotexture.  No hydronephrosis or focal abnormality.  Abdominal aorta:  No aneurysm identified.  IMPRESSION: Cholelithiasis.  No sonographic evidence of acute  cholecystitis.   Original Report Authenticated By: Charlett Nose, M.D.    Dg Foot Complete Right  03/30/2012  *RADIOLOGY REPORT*  Clinical Data: Ulcer at the malleolus  RIGHT FOOT COMPLETE - 3+ VIEW  Comparison: None.  Findings: Mild pes planus deformity.  No acute fracture and no dislocation.  No destructive bone lesion.  IMPRESSION: No acute bony pathology.   Original Report Authenticated By: Jolaine Click, M.D.     Medications:    . [COMPLETED] sodium chloride   Intravenous STAT  . antiseptic oral rinse  15 mL Mouth Rinse BID  . docusate sodium  100 mg Oral BID  . enoxaparin (LOVENOX) injection  55 mg Subcutaneous Q12H  . folic acid  1 mg Oral Daily  . HYDROmorphone PCA 0.3 mg/mL   Intravenous Q4H  . levofloxacin (LEVAQUIN) IV  750 mg Intravenous Q24H  . pantoprazole (PROTONIX) IV  40 mg Intravenous Q24H  . sodium chloride  3 mL Intravenous Q12H    Assessment/Plan Cholelithiasis/no GB wall thickening,CBD 3 mm, Mild LFT elevation Sickle cell with crisis Severe anemia with sickle cell crisis   Plan:  Medical management of sickle cell crisis.  Discuss cholecystectomy after she is over this.   LOS: 2 days    Ash Mcelwain 03/31/2012

## 2012-03-31 NOTE — Progress Notes (Signed)
I have seen and examined the patient and agree with the assessment and plans.  Leveta Wahab A. Samaia Iwata  MD, FACS  

## 2012-04-01 DIAGNOSIS — IMO0002 Reserved for concepts with insufficient information to code with codable children: Secondary | ICD-10-CM

## 2012-04-01 DIAGNOSIS — L089 Local infection of the skin and subcutaneous tissue, unspecified: Secondary | ICD-10-CM

## 2012-04-01 DIAGNOSIS — K5903 Drug induced constipation: Secondary | ICD-10-CM | POA: Diagnosis not present

## 2012-04-01 DIAGNOSIS — K5900 Constipation, unspecified: Secondary | ICD-10-CM

## 2012-04-01 HISTORY — DX: Reserved for concepts with insufficient information to code with codable children: IMO0002

## 2012-04-01 LAB — COMPREHENSIVE METABOLIC PANEL
Albumin: 3.3 g/dL — ABNORMAL LOW (ref 3.5–5.2)
Alkaline Phosphatase: 48 U/L (ref 39–117)
BUN: <3 mg/dL — ABNORMAL LOW (ref 6–23)
Chloride: 97 mEq/L (ref 96–112)
Potassium: 4 mEq/L (ref 3.5–5.1)
Total Bilirubin: 3.4 mg/dL — ABNORMAL HIGH (ref 0.3–1.2)

## 2012-04-01 LAB — URINE CULTURE: Colony Count: NO GROWTH

## 2012-04-01 LAB — CBC WITH DIFFERENTIAL/PLATELET
Blasts: 0 %
Lymphocytes Relative: 29 % (ref 12–46)
Lymphs Abs: 4.1 10*3/uL — ABNORMAL HIGH (ref 0.7–4.0)
Metamyelocytes Relative: 0 %
Monocytes Absolute: 2.5 10*3/uL — ABNORMAL HIGH (ref 0.1–1.0)
Monocytes Relative: 18 % — ABNORMAL HIGH (ref 3–12)
Neutrophils Relative %: 48 % (ref 43–77)
Platelets: 233 10*3/uL (ref 150–400)
RDW: 26.9 % — ABNORMAL HIGH (ref 11.5–15.5)
WBC: 14 10*3/uL — ABNORMAL HIGH (ref 4.0–10.5)
nRBC: 0 /100 WBC

## 2012-04-01 LAB — CBC
Hemoglobin: 7.2 g/dL — ABNORMAL LOW (ref 12.0–15.0)
MCHC: 37.9 g/dL — ABNORMAL HIGH (ref 30.0–36.0)
RDW: 24.8 % — ABNORMAL HIGH (ref 11.5–15.5)
WBC: 14.9 10*3/uL — ABNORMAL HIGH (ref 4.0–10.5)

## 2012-04-01 MED ORDER — POLYETHYLENE GLYCOL 3350 17 G PO PACK
17.0000 g | PACK | Freq: Every day | ORAL | Status: DC
  Administered 2012-04-01 – 2012-04-03 (×3): 17 g via ORAL
  Filled 2012-04-01 (×6): qty 1

## 2012-04-01 MED ORDER — ACETAMINOPHEN 325 MG PO TABS
325.0000 mg | ORAL_TABLET | ORAL | Status: DC | PRN
  Administered 2012-04-01 – 2012-04-04 (×3): 650 mg via ORAL
  Filled 2012-04-01 (×3): qty 2

## 2012-04-01 MED ORDER — OXYCODONE HCL 5 MG PO TABS
5.0000 mg | ORAL_TABLET | ORAL | Status: DC | PRN
  Administered 2012-04-02 (×4): 5 mg via ORAL
  Administered 2012-04-03 (×2): 10 mg via ORAL
  Filled 2012-04-01: qty 1
  Filled 2012-04-01: qty 2
  Filled 2012-04-01 (×2): qty 1
  Filled 2012-04-01: qty 2
  Filled 2012-04-01 (×2): qty 1

## 2012-04-01 MED ORDER — ENOXAPARIN SODIUM 40 MG/0.4ML ~~LOC~~ SOLN
40.0000 mg | SUBCUTANEOUS | Status: DC
  Administered 2012-04-02 – 2012-04-05 (×4): 40 mg via SUBCUTANEOUS
  Filled 2012-04-01 (×5): qty 0.4

## 2012-04-01 NOTE — Progress Notes (Signed)
Subjective: Stable no acute change, she says her urine is very dark. Abdominal discomfort is minimal, and ankle is still the main complaint to me this AM  Objective: Vital signs in last 24 hours: Temp:  [97.6 F (36.4 C)-99.3 F (37.4 C)] 99.3 F (37.4 C) (11/13 0530) Pulse Rate:  [56-68] 68  (11/13 0530) Resp:  [12-18] 18  (11/13 0401) BP: (104-130)/(60-72) 115/66 mmHg (11/13 0530) SpO2:  [92 %-100 %] 96 % (11/13 0530) Weight:  [129 lb 13.6 oz (58.9 kg)] 129 lb 13.6 oz (58.9 kg) (11/13 0153) Last BM Date: 03/30/12  Afebrile, VSS, WBC is up and H/H is better.  Intake/Output from previous day: 11/12 0701 - 11/13 0700 In: 2514.5 [P.O.:180; I.V.:2322; Blood:12.5] Out: 1675 [Urine:1675] Intake/Output this shift:    General appearance: alert, cooperative and no distress GI: soft, non-tender; bowel sounds normal; no masses,  no organomegaly  Lab Results:   [facility] 03/31/12 2338 03/31/12 0400  WBC 14.9* 11.9*  HGB 7.2* 6.0*  HCT 19.0* 15.7*  PLT 219 196    BMET  Basename 03/31/12 0400 03/30/12 0920  NA 138 139  K 3.9 3.4*  CL 105 104  CO2 26 22  GLUCOSE 105* 98  BUN 3* 4*  CREATININE 0.39* 0.41*  CALCIUM 7.4* 7.3*   PT/INR No results found for this basename: LABPROT:2,INR:2 in the last 72 hours   Lab 03/31/12 0400 03/30/12 0920 03/29/12 2206  AST 95* 121* 175*  ALT 38* 44* 62*  ALKPHOS 51 49 72  BILITOT 2.8* 2.9* 5.2*  PROT 6.4 6.1 8.0  ALBUMIN 3.5 3.4* 4.8     Lipase     Component Value Date/Time   LIPASE 30 03/29/2012 2206     Studies/Results: Dg Chest 1 View  03/30/2012  *RADIOLOGY REPORT*  Clinical Data: Chest pain  CHEST - 1 VIEW  Comparison: 03/29/2012  Findings: The heart is moderately enlarged.  Vascular congestion has developed.  Lungs under aerated with bibasilar atelectasis versus mild edema.  No pneumothorax.  Small pleural effusions are suspected.  IMPRESSION: Small pleural effusions suspected with bibasilar atelectasis versus mild  edema.   Original Report Authenticated By: Jolaine Click, M.D.    Dg Tibia/fibula Right  03/30/2012  *RADIOLOGY REPORT*  Clinical Data: Ulcer  RIGHT TIBIA AND FIBULA - 2 VIEW  Comparison: None.  Findings: No acute fracture and no dislocation.  No destructive bone lesion.  IMPRESSION: No acute bony pathology.   Original Report Authenticated By: Jolaine Click, M.D.    Dg Ankle Complete Right  03/30/2012  *RADIOLOGY REPORT*  Clinical Data: Medial ankle ulcer  RIGHT ANKLE - COMPLETE 3+ VIEW  Comparison: None.  Findings: No acute fracture or dislocation is noted.  No gross soft tissue abnormality is seen.  IMPRESSION: No acute abnormality noted.   Original Report Authenticated By: Alcide Clever, M.D.    Dg Abd Portable 1v  03/31/2012  *RADIOLOGY REPORT*  Clinical Data: Nausea, vomiting, abdominal pain, sickle cell disease  PORTABLE ABDOMEN - 1 VIEW  Comparison: None.  Findings: Normal bowel gas pattern without obstruction or ileus. No abnormal calcifications or mass effect.  Pelvic calcifications consistent with venous phleboliths on the left.  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  IMPRESSION: Normal bowel gas pattern.   Original Report Authenticated By: Judie Petit. Shick, M.D.    Dg Foot Complete Right  03/30/2012  *RADIOLOGY REPORT*  Clinical Data: Ulcer at the malleolus  RIGHT FOOT COMPLETE - 3+ VIEW  Comparison: None.  Findings: Mild pes planus deformity.  No acute fracture and no dislocation.  No destructive bone lesion.  IMPRESSION: No acute bony pathology.   Original Report Authenticated By: Jolaine Click, M.D.     Medications:    . [COMPLETED] acetaminophen  650 mg Oral Once  . antiseptic oral rinse  15 mL Mouth Rinse BID  . [COMPLETED] diphenhydrAMINE  25 mg Oral Once  . docusate sodium  100 mg Oral BID  . enoxaparin (LOVENOX) injection  55 mg Subcutaneous Q12H  . folic acid  1 mg Oral Daily  . HYDROmorphone PCA 0.3 mg/mL   Intravenous  Q4H  . levofloxacin (LEVAQUIN) IV  750 mg Intravenous Q24H  . pantoprazole (PROTONIX) IV  40 mg Intravenous Q24H  . sodium chloride  3 mL Intravenous Q12H    Assessment/Plan Cholelithiasis/no GB wall thickening,CBD 3 mm, Mild LFT elevation  Sickle cell with crisis  Severe anemia with sickle cell crisis   Will follow with you, discuss elective cholecystectomy after crisis.   LOS: 3 days    [name] 04/01/2012

## 2012-04-01 NOTE — Progress Notes (Signed)
[facility] PROGRESS NOTE  [name] ZOX:[id number] DOB: Apr 05, 1971 DOA: 03/29/2012 PCP: No primary provider on file.  Assessment/Plan: Principal Problem:  Sickle cell anemia with pain   Pt. Has had only minimal use of PCA and is agreeable to transitioning to oxycodone. She is anxious to get home to her 88 month old daughter. Will start oxycodone and discontinue PCA.  Active Problems:  Constipation: Patient has not had a bowel movement in several days. She complains of nausea associated with her constipation. I will add MiraLax to her current regimen.   Elevated LFTs: Been evaluated by general surgery is that the elevation of the LFTs are secondary to vaso-occlusive episode and not consistent with acute cholecystitis.  Nausea:  Currently patient denies any vomiting but states that she has some persistent nausea. This appears to be well controlled with Zofran. We'll continue to assess. The patient is on liquid diet and we'll advance her diet as tolerated to a regular diet.   Cholelithiasis: Patient has been evaluated by general surgery. They will follow up with patient's an outpatient when she has had resolution of her acute vaso-occlusive episode.   Anemia: And has recently moved to the area. She states that her sickle cell anemia has been managed at [facility] in [place]. She states that her normal hemoglobin usually resides around 7. And states that the only time that she's had frequent transfusions was during her pregnancy.   Leukocytosis:  There is a mild leukocytosis without any evidence of infection. I suspect that this is inflammatory owing to her acute vaso-occlusive episode.   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 It is pea-sized and appears to have dehydrated necrotic tissue at the base of the wound. She reports that this has been there for several months. Consult wound ostomy care to evaluate the  wound and provide recommendations.   Code Status: Full Code Family Communication: And oriented x3 capable of discussing her medical information at will Disposition Plan: Home at the time of discharge  Kyrell Ruacho A.  Pager [phone] If 7PM-7AM, please contact night-coverage. 04/01/2012, 10:00 PM  LOS: 3 days   Brief narrative: 41 yo female h/o sickle cell disease from [place] comes in with 4 days of n/v nonbloody with epi abd pain which is not typical of her sickle cell crisis pain. She normally has back pain with joint pain in her upper ext and not abd pain. No brbpr no diarrhea. No fevers. No dysuria. No rashes. Has not been hosp for over a year and a half. Still has gallbladder. Vomit is bilious. Her baseline hgb is between 7-8. It is down to 6.4. She is normally not jaundiced except during acute crisis.   Consultants:  None  Procedures:  None  Antibiotics:  None  HPI/Subjective: Patient states that she feels much better and almost back to her baseline.  Objective:  04/01/12 1112 04/01/12 1425 04/01/12 1800  BP: 125/68 111/65 109/61  Pulse: 65 66 69  Temp: 98.5 F (36.9 C) 98.1 F (36.7 C) 98.5 F (36.9 C)  TempSrc: Oral Oral Oral  Resp: 20 20 19   Height:     Weight:     SpO2: 96% 98% 95%   Weight change: 2.6 kg (5 lb 11.7 oz)  Intake/Output Summary (Last 24 hours) at 04/01/12 2200 Last data filed at 04/01/12 2118  Gross per 24 hour  Intake   2192 ml  Output   3675  ml  Net  -1483 ml    General: Alert, awake, oriented x3, in no acute distress.  HEENT: [place]/AT PEERL, EOMI OROPHARYNX:  Moist, No exudate/ erythema/lesions.  Heart: Regular rate and rhythm, without murmurs, rubs, gallops.  Lungs: Clear to auscultation. Abdomen: Soft, nontender, nondistended, positive bowel sounds, no masses no hepatosplenomegaly noted.  Neuro: No focal neurological deficits noted cranial nerves II through XII grossly intact. . Musculoskeletal: No warm swelling or erythema  around joints, no spinal tenderness noted. Skin: And has a pea-sized wound on the distal right lower extremity which has hydrated no chronic tissue in the base of the wound. There is no erythema or warmth around the wound.  Data Reviewed: Basic Metabolic Panel:  Lab 04/01/12 1610 03/31/12 0400 03/30/12 0920 03/29/12 2206  NA 133* 138 139 129*  K 4.0 3.9 3.4* 3.4*  CL 97 105 104 93*  CO2 23 26 22 22   GLUCOSE 138* 105* 98 105*  BUN <3* 3* 4* 6  CREATININE 0.32* 0.39* 0.41* 0.51  CALCIUM 7.6* 7.4* 7.3* 8.9  MG -- 2.3 2.0 --  PHOS -- -- -- --   Liver Function Tests:  Lab 04/01/12 1015 03/31/12 0400 03/30/12 0920 03/29/12 2206  AST 27 95* 121* 175*  ALT 34 38* 44* 62*  ALKPHOS 48 51 49 72  BILITOT 3.4* 2.8* 2.9* 5.2*  PROT 6.0 6.4 6.1 8.0  ALBUMIN 3.3* 3.5 3.4* 4.8    Lab 03/31/12 0400 03/29/12 2206  LIPASE -- 30  AMYLASE 62 --   No results found for this basename: AMMONIA:5 in the last 168 hours CBC:  Lab 04/01/12 1015 03/31/12 2338 03/31/12 0400 03/30/12 0920 03/29/12 2206  WBC 14.0* 14.9* 11.9* 11.4* 12.0*  NEUTROABS 6.7 -- 7.2 -- 7.6  HGB 7.1* 7.2* 6.0* 6.3* 6.4*  HCT 19.4* 19.0* 15.7* 16.4* 16.1*  MCV 79.2 77.6* 74.4* 74.2* 72.9*  PLT 233 219 196 209 269   Cardiac Enzymes: No results found for this basename: CKTOTAL:5,CKMB:5,CKMBINDEX:5,TROPONINI:5 in the last 168 hours BNP (last 3 results) No results found for this basename: PROBNP:3 in the last 8760 hours CBG: No results found for this basename: GLUCAP:5 in the last 168 hours  Recent Results (from the past 240 hour(s))  CULTURE, BLOOD (ROUTINE X 2)     Status: Normal (Preliminary result)   Collection Time   03/29/12 11:58 PM      Component Value Range Status Comment   Specimen Description BLOOD RIGHT FOREARM   Final    Special Requests BOTTLES DRAWN AEROBIC ONLY 6CC   Final    Culture  Setup Time 03/30/2012 09:17   Final    Culture     Final    Value:        BLOOD CULTURE RECEIVED NO GROWTH TO DATE  CULTURE WILL BE HELD FOR 5 DAYS BEFORE ISSUING A FINAL NEGATIVE REPORT   Report Status PENDING   Incomplete   CULTURE, BLOOD (ROUTINE X 2)     Status: Normal (Preliminary result)   Collection Time   03/30/12 12:05 AM      Component Value Range Status Comment   Specimen Description BLOOD LEFT FOREARM   Final    Special Requests BOTTLES DRAWN AEROBIC ONLY 10CC   Final    Culture  Setup Time 03/30/2012 09:16   Final    Culture     Final    Value:        BLOOD CULTURE RECEIVED NO GROWTH TO DATE CULTURE WILL BE HELD FOR  5 DAYS BEFORE ISSUING A FINAL NEGATIVE REPORT   Report Status PENDING   Incomplete   URINE CULTURE     Status: Normal   Collection Time   03/30/12  9:51 PM      Component Value Range Status Comment   Specimen Description URINE, CLEAN CATCH   Final    Special Requests NONE   Final    Culture  Setup Time 03/31/2012 03:06   Final    Colony Count NO GROWTH   Final    Culture NO GROWTH   Final    Report Status 04/01/2012 FINAL   Final      Studies: Dg Chest 1 View  03/30/2012  *RADIOLOGY REPORT*  Clinical Data: Chest pain  CHEST - 1 VIEW  Comparison: 03/29/2012  Findings: The heart is moderately enlarged.  Vascular congestion has developed.  Lungs under aerated with bibasilar atelectasis versus mild edema.  No pneumothorax.  Small pleural effusions are suspected.  IMPRESSION: Small pleural effusions suspected with bibasilar atelectasis versus mild edema.   Original Report Authenticated By: Jolaine Click, M.D.    Dg Chest 2 View  03/29/2012  *RADIOLOGY REPORT*  Clinical Data: Fever and emesis.  History of sickle cell disease.  CHEST - 2 VIEW  Comparison: 07/08/2010  Findings: Heart size is mildly enlarged.  There is no pleural effusion identified.  The bilateral, upper lobe predominant interstitial prominence is likely reflects scarring.  No superimposed airspace consolidation.  Bony changes of sickle cell disease noted.  IMPRESSION:  1.  No acute cardiopulmonary abnormalities  noted.   Original Report Authenticated By: [name], M.D.    Dg Tibia/fibula Right  03/30/2012  *RADIOLOGY REPORT*  Clinical Data: Ulcer  RIGHT TIBIA AND FIBULA - 2 VIEW  Comparison: None.  Findings: No acute fracture and no dislocation.  No destructive bone lesion.  IMPRESSION: No acute bony pathology.   Original Report Authenticated By: Jolaine Click, M.D.    Dg Ankle Complete Right  03/30/2012  *RADIOLOGY REPORT*  Clinical Data: Medial ankle ulcer  RIGHT ANKLE - COMPLETE 3+ VIEW  Comparison: None.  Findings: No acute fracture or dislocation is noted.  No gross soft tissue abnormality is seen.  IMPRESSION: No acute abnormality noted.   Original Report Authenticated By: Alcide Clever, M.D.    US Abdomen Complete  03/30/2012  *RADIOLOGY REPORT*  Clinical Data:  Elevated LFTs, jaundice.  Nausea, vomiting.  COMPLETE ABDOMINAL ULTRASOUND  Comparison:  None.  Findings:  Gallbladder:  Multiple mobile gallstones within the gallbladder. No wall thickening.  Negative sonographic Murphy's.  Common bile duct:   Normal caliber, 3 mm.  Liver:  No focal lesion identified.  Within normal limits in parenchymal echogenicity.  IVC:  Appears normal.  Pancreas:  No focal abnormality seen.  Spleen:  Not definitively visualized.  Right Kidney:   Slightly increased echotexture.  13.8 cm.  No hydronephrosis or focal abnormality.  Left Kidney:  13.2 cm.  Slightly increased echotexture.  No hydronephrosis or focal abnormality.  Abdominal aorta:  No aneurysm identified.  IMPRESSION: Cholelithiasis.  No sonographic evidence of acute cholecystitis.   Original Report Authenticated By: Charlett Nose, M.D.    Dg Abd Portable 1v  03/31/2012  *RADIOLOGY REPORT*  Clinical Data: Nausea, vomiting, abdominal pain, sickle cell disease  PORTABLE ABDOMEN - 1 VIEW  Comparison: None.  Findings: Normal bowel gas pattern without obstruction or ileus. No abnormal calcifications or mass effect.  Pelvic calcifications consistent with venous  phleboliths on the left.  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  IMPRESSION: Normal bowel gas pattern.   Original Report Authenticated By: Judie Petit. Shick, M.D.    Dg Foot Complete Right  03/30/2012  *RADIOLOGY REPORT*  Clinical Data: Ulcer at the malleolus  RIGHT FOOT COMPLETE - 3+ VIEW  Comparison: None.  Findings: Mild pes planus deformity.  No acute fracture and no dislocation.  No destructive bone lesion.  IMPRESSION: No acute bony pathology.   Original Report Authenticated By: Jolaine Click, M.D.     Scheduled Meds:   . antiseptic oral rinse  15 mL Mouth Rinse BID  . docusate sodium  100 mg Oral BID  . enoxaparin (LOVENOX) injection  40 mg Subcutaneous Q24H  . folic acid  1 mg Oral Daily  . levofloxacin (LEVAQUIN) IV  750 mg Intravenous Q24H  . pantoprazole (PROTONIX) IV  40 mg Intravenous Q24H  . polyethylene glycol  17 g Oral Daily  . sodium chloride  3 mL Intravenous Q12H  . [DISCONTINUED] enoxaparin (LOVENOX) injection  55 mg Subcutaneous Q12H  . [DISCONTINUED] HYDROmorphone PCA 0.3 mg/mL   Intravenous Q4H   Continuous Infusions:   . dextrose 5 % and 0.45 % NaCl with KCl 20 mEq/L 100 mL/hr at 04/01/12 1157    Principal Problem:  *Sickle cell anemia with pain Active Problems:  Sickle cell disease  Elevated LFTs  Abdominal  pain, other specified site  Nausea and vomiting in adult  Cholelithiasis  Anemia  Leukocytosis  Hypokalemia  Chronic wound of extremity

## 2012-04-01 NOTE — Progress Notes (Signed)
I have seen and examined the patient and agree with the assessment and plans.  Tylene Quashie A. Debbora Ang  MD, FACS  

## 2012-04-02 DIAGNOSIS — D571 Sickle-cell disease 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: Secondary | ICD-10-CM

## 2012-04-02 LAB — CBC WITH DIFFERENTIAL/PLATELET
Basophils Absolute: 0 10*3/uL (ref 0.0–0.1)
Basophils Relative: 0 % (ref 0–1)
Eosinophils Absolute: 0.1 10*3/uL (ref 0.0–0.7)
MCH: 29.4 pg (ref 26.0–34.0)
MCHC: 36.2 g/dL — ABNORMAL HIGH (ref 30.0–36.0)
Monocytes Absolute: 0.8 10*3/uL (ref 0.1–1.0)
Neutrophils Relative %: 64 % (ref 43–77)
Platelets: 224 10*3/uL (ref 150–400)
RDW: 28.8 % — ABNORMAL HIGH (ref 11.5–15.5)

## 2012-04-02 LAB — RETICULOCYTES
RBC.: 2.37 MIL/uL — ABNORMAL LOW (ref 3.87–5.11)
Retic Ct Pct: 29 % — ABNORMAL HIGH (ref 0.4–3.1)

## 2012-04-02 LAB — COMPREHENSIVE METABOLIC PANEL
Albumin: 3 g/dL — ABNORMAL LOW (ref 3.5–5.2)
BUN: <3 mg/dL — ABNORMAL LOW (ref 6–23)
Calcium: 7.8 mg/dL — ABNORMAL LOW (ref 8.4–10.5)
Chloride: 101 mEq/L (ref 96–112)
Creatinine, Ser: 0.36 mg/dL — ABNORMAL LOW (ref 0.50–1.10)
Total Bilirubin: 7.1 mg/dL — ABNORMAL HIGH (ref 0.3–1.2)

## 2012-04-02 LAB — HEMOGLOBIN AND HEMATOCRIT, BLOOD: Hemoglobin: 8.3 g/dL — ABNORMAL LOW (ref 12.0–15.0)

## 2012-04-02 MED ORDER — POTASSIUM CHLORIDE CRYS ER 20 MEQ PO TBCR
40.0000 meq | EXTENDED_RELEASE_TABLET | ORAL | Status: AC
  Administered 2012-04-02 (×2): 40 meq via ORAL
  Filled 2012-04-02 (×2): qty 2

## 2012-04-02 MED ORDER — PANTOPRAZOLE SODIUM 40 MG PO TBEC
40.0000 mg | DELAYED_RELEASE_TABLET | Freq: Every day | ORAL | Status: DC
  Administered 2012-04-02 – 2012-04-05 (×4): 40 mg via ORAL
  Filled 2012-04-02 (×4): qty 1

## 2012-04-02 NOTE — Progress Notes (Signed)
  Subjective: She is sleepy and hurts all over chest, abdomen and legs.  Objective: Vital signs in last 24 hours: Temp:  [98.1 F (36.7 C)-100.7 F (38.2 C)] 98.4 F (36.9 C) (11/14 0631) Pulse Rate:  [65-89] 75  (11/14 0631) Resp:  [16-20] 20  (11/14 0631) BP: (90-125)/(49-68) 90/49 mmHg (11/14 0631) SpO2:  [93 %-98 %] 93 % (11/14 0631) Weight:  [130 lb 4.7 oz (59.1 kg)] 130 lb 4.7 oz (59.1 kg) (11/14 0631) Last BM Date: 03/30/12 120 PO recorded. 3850 urine output recorded.  BP is down some at 0300, but otherwise stable yesterday.  Intake/Output from previous day: 11/13 0701 - 11/14 0700 In: 1784 [P.O.:120; I.V.:1664] Out: 3850 [Urine:3850] Intake/Output this shift:    General appearance: sleepy, not really talking just shaking her head to questions. Chest wall:  she is tender whereever you touch her today. GI: tender with palpation of abdomen both sides upper and lower quadrants.  Lab Results:   [facility] 04/02/12 0356 04/01/12 1015  WBC 10.3 14.0*  HGB 6.8* 7.1*  HCT 18.8* 19.4*  PLT 224 233    BMET  Basename 04/02/12 0356 04/01/12 1015  NA 137 133*  K 3.3* 4.0  CL 101 97  CO2 30 23  GLUCOSE 103* 138*  BUN <3* <3*  CREATININE 0.36* 0.32*  CALCIUM 7.8* 7.6*   PT/INR No results found for this basename: LABPROT:2,INR:2 in the last 72 hours   Lab 04/02/12 0356 04/01/12 1015 03/31/12 0400 03/30/12 0920 03/29/12 2206  AST 121* 27 95* 121* 175*  ALT 57* 34 38* 44* 62*  ALKPHOS 64 48 51 49 72  BILITOT 7.1* 3.4* 2.8* 2.9* 5.2*  PROT 5.9* 6.0 6.4 6.1 8.0  ALBUMIN 3.0* 3.3* 3.5 3.4* 4.8     Lipase     Component Value Date/Time   LIPASE 30 03/29/2012 2206     Studies/Results: Dg Abd Portable 1v  03/31/2012  *RADIOLOGY REPORT*  Clinical Data: Nausea, vomiting, abdominal pain, sickle cell disease  PORTABLE ABDOMEN - 1 VIEW  Comparison: None.  Findings: Normal bowel gas pattern without obstruction or ileus. No abnormal calcifications or mass effect.   Pelvic calcifications consistent with venous phleboliths on the left.  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  IMPRESSION: Normal bowel gas pattern.   Original Report Authenticated By: Judie Petit. Miles Costain, M.D.     Medications:    . antiseptic oral rinse  15 mL Mouth Rinse BID  . docusate sodium  100 mg Oral BID  . enoxaparin (LOVENOX) injection  40 mg Subcutaneous Q24H  . folic acid  1 mg Oral Daily  . levofloxacin (LEVAQUIN) IV  750 mg Intravenous Q24H  . pantoprazole (PROTONIX) IV  40 mg Intravenous Q24H  . polyethylene glycol  17 g Oral Daily  . sodium chloride  3 mL Intravenous Q12H  . [DISCONTINUED] enoxaparin (LOVENOX) injection  55 mg Subcutaneous Q12H  . [DISCONTINUED] HYDROmorphone PCA 0.3 mg/mL   Intravenous Q4H    Assessment/Plan Cholelithiasis/no GB wall thickening,CBD 3 mm, Mild alk phos, transaminases elevation, rising bilirubin Sickle cell with crisis  Severe anemia with sickle cell crisis Plan:  Cholecystectomy as an elective procedure after she recovers from crisis.  LOS: 4 days    [name] 04/02/2012

## 2012-04-02 NOTE — Progress Notes (Signed)
[facility] PROGRESS NOTE  [name] ZOX:[id number] DOB: [birth year] DOA: 03/29/2012 PCP: No primary provider on file.  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 I will check [name] hemoglobin electrophoresis and transfuse 1 unit PRBC's.Pt. has recently moved to the area. She states that her sickle cell anemia has been managed at [facility] in [place]. She states that her normal hemoglobin usually resides around 7. And states that the only time that she's had frequent transfusions was during her pregnancy.    Sickle cell anemia with pain   Pt. Has had only minimal use of oxycodone since discontinuation of PCA yesterday. Also continue Toradol for anti-inflammatory effects in controlling pain.  She is anxious to get home to her 21 month old daughter.    Fever: Pt had an episode of elevated temp to 100.7. She was medicated with tylenol. She has had no further fevers. If she has any further fevers will initiate ID work-up.   Constipation: Patient has not had [name] bowel movement in several days. She complains of nausea associated with her constipation. I will add MiraLax to her current regimen.   Elevated LFTs: Been evaluated by general surgery is that the elevation of the LFTs are secondary to vaso-occlusive episode and not consistent with acute cholecystitis. However I am concerned that the acute elevation of LFT's, RUQ pain and episode of fever could represent an acute process in abdomen. I will have [name] low threshold for starting antibiotics if fever reoccurs.  Nausea:  Currently patient denies any vomiting but states that she has some persistent nausea. This appears to be well controlled with Zofran. We'll continue to assess. The patient is on liquid diet and we'll advance her diet as tolerated to [name] regular diet.   Cholelithiasis: Patient has been evaluated  by general surgery. They will follow up with patient's an outpatient when she has had resolution of her acute vaso-occlusive episode.     Leukocytosis:  There is [name] mild leukocytosis without any evidence of infection. I suspect that this is inflammatory owing to her acute vaso-occlusive episode.   Chronic wound of extremity:  Patient has [name] chronic wound on the medial surface of the left kidney. It is pea-sized and appears to have dehydrated necrotic tissue at the base of the wound. She reports that this has been there for several months. Appreciate  wound ostomy care recommendations.   Code Status: Full Code Family Communication: And oriented x3 capable of discussing her medical information at will Disposition Plan: Home at the time of discharge  [name].  Pager [phone] If 7PM-7AM, please contact night-coverage. 04/02/2012, 12:11 PM  LOS: 4 days   Brief narrative: 41 yo female h/o sickle cell disease from [name] comes in with 4 days of n/v nonbloody with epi abd pain which is not typical of her sickle cell crisis pain. She normally has back pain with joint pain in her upper ext and not abd pain. No brbpr no diarrhea. No fevers. No dysuria. No rashes. Has not been hosp for over [name] year and [name] half. Still has gallbladder. Vomit is bilious. Her baseline hgb is between 7-8. It is down to 6.4. She is normally not jaundiced except during acute crisis.   Consultants:  None  Procedures:  None  Antibiotics:  None  HPI/Subjective: Patient states that she feels much better and almost back to her baseline.  Objective:  04/01/12 1112 04/01/12 1425 04/01/12 1800  BP: 125/68 111/65 109/61  Pulse: 65 66 69  Temp: 98.5 F (36.9 C) 98.1 F (36.7 C) 98.5 F (36.9 C)  TempSrc: Oral Oral Oral  Resp: 20 20 19   Height:     Weight:     SpO2: 96% 98% 95%   Weight change: 0.2 kg (7.1 oz)  Intake/Output Summary (Last 24 hours) at 04/02/12 1211 Last data filed at 04/02/12 1030   Gross per 24 hour  Intake 1796.5 ml  Output   4350 ml  Net -2553.5 ml    General: Alert, awake, oriented x3, in no acute distress.  HEENT: [place]/AT PEERL, EOMI OROPHARYNX:  Moist, No exudate/ erythema/lesions.  Heart: Regular rate and rhythm, without murmurs, rubs, gallops.  Lungs: Clear to auscultation. Abdomen: Soft,mild RUQ tenderness, nondistended, positive bowel sounds, no masses no hepatosplenomegaly noted.  Neuro: No focal neurological deficits noted cranial nerves II through XII grossly intact. . Musculoskeletal: No warm swelling or erythema around joints, no spinal tenderness noted. Skin: And has [name] pea-sized wound on the distal right lower extremity which has hydrated no chronic tissue in the base of the wound. There is no erythema or warmth around the wound.  Data Reviewed: Basic Metabolic Panel:  Lab 04/02/12 8657 04/01/12 1015 03/31/12 0400 03/30/12 0920 03/29/12 2206  NA 137 133* 138 139 129*  K 3.3* 4.0 3.9 3.4* 3.4*  CL 101 97 105 104 93*  CO2 30 23 26 22 22   GLUCOSE 103* 138* 105* 98 105*  BUN <3* <3* 3* 4* 6  CREATININE 0.36* 0.32* 0.39* 0.41* 0.51  CALCIUM 7.8* 7.6* 7.4* 7.3* 8.9  MG -- -- 2.3 2.0 --  PHOS -- -- -- -- --   Liver Function Tests:  Lab 04/02/12 0356 04/01/12 1015 03/31/12 0400 03/30/12 0920 03/29/12 2206  AST 121* 27 95* 121* 175*  ALT 57* 34 38* 44* 62*  ALKPHOS 64 48 51 49 72  BILITOT 7.1* 3.4* 2.8* 2.9* 5.2*  PROT 5.9* 6.0 6.4 6.1 8.0  ALBUMIN 3.0* 3.3* 3.5 3.4* 4.8    Lab 03/31/12 0400 03/29/12 2206  LIPASE -- 30  AMYLASE 62 --   No results found for this basename: AMMONIA:5 in the last 168 hours CBC:  Lab 04/02/12 0356 04/01/12 1015 03/31/12 2338 03/31/12 0400 03/30/12 0920 03/29/12 2206  WBC 10.3 14.0* 14.9* 11.9* 11.4* --  NEUTROABS 6.6 6.7 -- 7.2 -- 7.6  HGB 6.8* 7.1* 7.2* 6.0* 6.3* --  HCT 18.8* 19.4* 19.0* 15.7* 16.4* --  MCV 81.4 79.2 77.6* 74.4* 74.2* --  PLT 224 233 219 196 209 --   Cardiac Enzymes: No results found  for this basename: CKTOTAL:5,CKMB:5,CKMBINDEX:5,TROPONINI:5 in the last 168 hours BNP (last 3 results) No results found for this basename: PROBNP:3 in the last 8760 hours CBG: No results found for this basename: GLUCAP:5 in the last 168 hours  Recent Results (from the past 240 hour(s))  CULTURE, BLOOD (ROUTINE X 2)     Status: Normal (Preliminary result)   Collection Time   03/29/12 11:58 PM      Component Value Range Status Comment   Specimen Description BLOOD RIGHT FOREARM   Final    Special Requests BOTTLES DRAWN AEROBIC ONLY [facility]   Final    Culture  Setup Time 03/30/2012 09:17   Final    Culture     Final    Value:        BLOOD CULTURE RECEIVED NO GROWTH TO DATE CULTURE WILL  BE HELD FOR 5 DAYS BEFORE ISSUING [name] FINAL NEGATIVE REPORT   Report Status PENDING   Incomplete   CULTURE, BLOOD (ROUTINE X 2)     Status: Normal (Preliminary result)   Collection Time   03/30/12 12:05 AM      Component Value Range Status Comment   Specimen Description BLOOD LEFT FOREARM   Final    Special Requests BOTTLES DRAWN AEROBIC ONLY 10CC   Final    Culture  Setup Time 03/30/2012 09:16   Final    Culture     Final    Value:        BLOOD CULTURE RECEIVED NO GROWTH TO DATE CULTURE WILL BE HELD FOR 5 DAYS BEFORE ISSUING [name] FINAL NEGATIVE REPORT   Report Status PENDING   Incomplete   URINE CULTURE     Status: Normal   Collection Time   03/30/12  9:51 PM      Component Value Range Status Comment   Specimen Description URINE, CLEAN CATCH   Final    Special Requests NONE   Final    Culture  Setup Time 03/31/2012 03:06   Final    Colony Count NO GROWTH   Final    Culture NO GROWTH   Final    Report Status 04/01/2012 FINAL   Final      Studies: Dg Chest 1 View  03/30/2012  *RADIOLOGY REPORT*  Clinical Data: Chest pain  CHEST - 1 VIEW  Comparison: 03/29/2012  Findings: The heart is moderately enlarged.  Vascular congestion has developed.  Lungs under aerated with bibasilar atelectasis versus mild edema.   No pneumothorax.  Small pleural effusions are suspected.  IMPRESSION: Small pleural effusions suspected with bibasilar atelectasis versus mild edema.   Original Report Authenticated By: Jolaine Click, M.D.    Dg Chest 2 View  03/29/2012  *RADIOLOGY REPORT*  Clinical Data: Fever and emesis.  History of sickle cell disease.  CHEST - 2 VIEW  Comparison: 07/08/2010  Findings: Heart size is mildly enlarged.  There is no pleural effusion identified.  The bilateral, upper lobe predominant interstitial prominence is likely reflects scarring.  No superimposed airspace consolidation.  Bony changes of sickle cell disease noted.  IMPRESSION:  1.  No acute cardiopulmonary abnormalities noted.   Original Report Authenticated By: [name], M.D.    Dg Tibia/fibula Right  03/30/2012  *RADIOLOGY REPORT*  Clinical Data: Ulcer  RIGHT TIBIA AND FIBULA - 2 VIEW  Comparison: None.  Findings: No acute fracture and no dislocation.  No destructive bone lesion.  IMPRESSION: No acute bony pathology.   Original Report Authenticated By: Jolaine Click, M.D.    Dg Ankle Complete Right  03/30/2012  *RADIOLOGY REPORT*  Clinical Data: Medial ankle ulcer  RIGHT ANKLE - COMPLETE 3+ VIEW  Comparison: None.  Findings: No acute fracture or dislocation is noted.  No gross soft tissue abnormality is seen.  IMPRESSION: No acute abnormality noted.   Original Report Authenticated By: Alcide Clever, M.D.    US Abdomen Complete  03/30/2012  *RADIOLOGY REPORT*  Clinical Data:  Elevated LFTs, jaundice.  Nausea, vomiting.  COMPLETE ABDOMINAL ULTRASOUND  Comparison:  None.  Findings:  Gallbladder:  Multiple mobile gallstones within the gallbladder. No wall thickening.  Negative sonographic Murphy's.  Common bile duct:   Normal caliber, 3 mm.  Liver:  No focal lesion identified.  Within normal limits in parenchymal echogenicity.  IVC:  Appears normal.  Pancreas:  No focal abnormality seen.  Spleen:  Not definitively visualized.  Right Kidney:    Slightly increased echotexture.  13.8 cm.  No hydronephrosis or focal abnormality.  Left Kidney:  13.2 cm.  Slightly increased echotexture.  No hydronephrosis or focal abnormality.  Abdominal aorta:  No aneurysm identified.  IMPRESSION: Cholelithiasis.  No sonographic evidence of acute cholecystitis.   Original Report Authenticated By: Charlett Nose, M.D.    Dg Abd Portable 1v  03/31/2012  *RADIOLOGY REPORT*  Clinical Data: Nausea, vomiting, abdominal pain, sickle cell disease  PORTABLE ABDOMEN - 1 VIEW  Comparison: None.  Findings: Normal bowel gas pattern without obstruction or ileus. No abnormal calcifications or mass effect.  Pelvic calcifications consistent with venous phleboliths on the left.  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  IMPRESSION: Normal bowel gas pattern.   Original Report Authenticated By: Judie Petit. Shick, M.D.    Dg Foot Complete Right  03/30/2012  *RADIOLOGY REPORT*  Clinical Data: Ulcer at the malleolus  RIGHT FOOT COMPLETE - 3+ VIEW  Comparison: None.  Findings: Mild pes planus deformity.  No acute fracture and no dislocation.  No destructive bone lesion.  IMPRESSION: No acute bony pathology.   Original Report Authenticated By: Jolaine Click, M.D.     Scheduled Meds:    . antiseptic oral rinse  15 mL Mouth Rinse BID  . docusate sodium  100 mg Oral BID  . enoxaparin (LOVENOX) injection  40 mg Subcutaneous Q24H  . folic acid  1 mg Oral Daily  . levofloxacin (LEVAQUIN) IV  750 mg Intravenous Q24H  . pantoprazole  40 mg Oral Q1200  . polyethylene glycol  17 g Oral Daily  . sodium chloride  3 mL Intravenous Q12H  . [DISCONTINUED] HYDROmorphone PCA 0.3 mg/mL   Intravenous Q4H  . [DISCONTINUED] pantoprazole (PROTONIX) IV  40 mg Intravenous Q24H   Continuous Infusions:    . dextrose 5 % and 0.45 % NaCl with KCl 20 mEq/L 100 mL/hr at 04/01/12 2345    Principal Problem:  *Sickle cell anemia with pain Active  Problems:  Sickle cell disease  Elevated LFTs  Abdominal  pain, other specified site  Nausea and vomiting in adult  Cholelithiasis  Anemia  Leukocytosis  Hypokalemia  Chronic wound of extremity  Constipation

## 2012-04-02 NOTE — Progress Notes (Signed)
Pharmacy: IV to PO Protonix   This patient is receiving IV Protonix. Based on criteria approved by the Pharmacy and Therapeutics Committee, this medication is being converted to the equivalent oral dose form. These criteria include:   . The patient is eating (either orally or per tube) and/or has been taking other orally administered medications for at least 24 hours.  . This patient has no evidence of active gastrointestinal bleeding or impaired GI absorption (gastrectomy, short bowel, patient on TNA or NPO).   If you have questions about this conversion, please contact the pharmacy department.  BorgerdingLoma Messing PharmD Pager #: [phone] 8:41 AM 04/02/2012

## 2012-04-02 NOTE — Progress Notes (Signed)
I have seen and examined the patient and agree with the assessment and plans.  Vernel Langenderfer A. Marivel Mcclarty  MD, FACS  

## 2012-04-02 NOTE — Consult Note (Signed)
WOC consult Note Reason for Consult:Seen per MD request for assessment and suggestions for topical care or right ankle wound patient has had for greater than 1 year. Wound type: venous insufficiency vs ischemic due to Sickle Cell Disease Pressure Ulcer POA: No Measurement:.6 x .4 x .2cm with yellow base in center of previously ulcerated and now healed 6cm x 5cm area of scarring Wound bed:As above Drainage (amount, consistency, odor) scant yellow Periwound: scarred (see note above) Dressing procedure/placement/frequency: silver hydrofiber (Aquacel Ag+) placed after saline cleanse.  Change tomorrow and then M-W-F. I will not follow.  Please re-consult if needed. Thanks, Ladona Mow, MSN, RN, [facility], CWOCN [phone])

## 2012-04-03 DIAGNOSIS — I498 Other specified cardiac arrhythmias: Secondary | ICD-10-CM

## 2012-04-03 DIAGNOSIS — R001 Bradycardia, unspecified: Secondary | ICD-10-CM

## 2012-04-03 HISTORY — DX: Bradycardia, unspecified: R00.1

## 2012-04-03 LAB — COMPREHENSIVE METABOLIC PANEL
ALT: 61 U/L — ABNORMAL HIGH (ref 0–35)
AST: 85 U/L — ABNORMAL HIGH (ref 0–37)
Calcium: 8.1 mg/dL — ABNORMAL LOW (ref 8.4–10.5)
Sodium: 140 mEq/L (ref 135–145)
Total Protein: 5.8 g/dL — ABNORMAL LOW (ref 6.0–8.3)

## 2012-04-03 LAB — TYPE AND SCREEN
ABO/RH(D): B POS
Antibody Screen: NEGATIVE
Unit division: 0

## 2012-04-03 LAB — CBC WITH DIFFERENTIAL/PLATELET
Basophils Absolute: 0 10*3/uL (ref 0.0–0.1)
Eosinophils Relative: 2 % (ref 0–5)
MCV: 83.4 fL (ref 78.0–100.0)
Monocytes Relative: 7 % (ref 3–12)
Neutrophils Relative %: 41 % — ABNORMAL LOW (ref 43–77)
Platelets: 239 10*3/uL (ref 150–400)
RBC: 2.96 MIL/uL — ABNORMAL LOW (ref 3.87–5.11)
WBC: 8.9 10*3/uL (ref 4.0–10.5)

## 2012-04-03 MED ORDER — HYDROMORPHONE HCL PF 1 MG/ML IJ SOLN: 1.0000 mg | INTRAMUSCULAR | Status: DC | PRN

## 2012-04-03 NOTE — Progress Notes (Signed)
I have seen and examined the patient and agree with the assessment and plans. Will see prn for weekend.  Harlynn Kimbell A. Magnus Ivan  MD, FACS

## 2012-04-03 NOTE — Progress Notes (Signed)
[facility] PROGRESS NOTE  [name] GMW:[id number] DOB: 10-07-1970 DOA: 03/29/2012 PCP: No primary provider on file.  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 Hemoglobin electrophoresis in process. Pt. has recently moved to the area. She states that her sickle cell anemia has been managed at [facility] in [place]. She states that her normal hemoglobin usually resides around 7. And states that the only time that she's had frequent transfusions was during her pregnancy.    Sickle cell anemia with pain   Pt. Has had only minimal use of oxycodone since discontinuation of PCA yesterday. Toradol for anti-inflammatory effects in controlling pain discontinued after today.  She is anxious to get home to her 78 month old daughter.    Bradycardia: Pt was found to be bradycardic on examination, however this appears to be symptomatic. She is not on any rate limiting medications. Despite the low HR, her BP is supported. A 12-lead EKG shows only bradycardia without AVB.   Fever: Pt has been afebrile sine yesterday.   Constipation: Patient has not had a bowel movement in several days. She complains of nausea associated with her constipation. Miralax does not appear to be effective. I've asked nursing to give senekot. If still not effective will give magnesium citrate.   Elevated LFTs: Been evaluated by general surgery is that the elevation of the LFTs are secondary to vaso-occlusive episode and not consistent with acute cholecystitis. However I am concerned that the acute elevation of LFT's, RUQ pain and episode of fever could represent an acute process in abdomen. I have discussed this with Dr. Magnus Ivan who recommends a HIDA scan.  Nausea:  Currently patient denies any vomiting but states that she has some persistent  nausea. This appears to be well controlled with Zofran. We'll continue to assess. The patient is on liquid diet and we'll advance her diet as tolerated to a regular diet.   Cholelithiasis: Patient has been evaluated by general surgery. They will follow up with patient's an outpatient when she has had resolution of her acute vaso-occlusive episode.     Leukocytosis:  There was a mild leukocytosis without any evidence of infection. I suspect that this was inflammatory owing to her acute vaso-occlusive episode.Now resolved.   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 It is pea-sized and appears to have dehydrated necrotic tissue at the base of the wound. She reports that this has been there for several months. Appreciate  wound ostomy care recommendations.   Code Status: Full Code Family Communication: And oriented x3 capable of discussing her medical information at will Disposition Plan: Home at the time of discharge  [name],[name]  Pager [phone] If 7PM-7AM, please contact night-coverage. 04/03/2012, 8:55 PM  LOS: 5 days   Brief narrative: 41 yo female h/o sickle cell disease from [place] comes in with 4 days of n/v nonbloody with epi abd pain which is not typical of her sickle cell crisis pain. She normally has back pain with joint pain in her upper ext and not abd pain. No brbpr no diarrhea. No fevers. No dysuria. No rashes. Has not been hosp for over a year and a half. Still has gallbladder. Vomit is bilious. Her baseline hgb is between 7-8. It is down to 6.4. She is normally not jaundiced except  during acute crisis.   Consultants:  None  Procedures:  None  Antibiotics:  None  HPI/Subjective: Patient states that she feels much better and almost back to her baseline.  Objective:  04/01/12 1112 04/01/12 1425 04/02/12 1401  BP: 125/68 111/65 130/70  Pulse: 65 66 48  Temp: 98.5 F (36.9 C) 98.1 F (36.7 C)  98.4 F (36.9 C)  TempSrc: Oral Oral Oral  Resp: 20 20 16   Height:     Weight:     SpO2: 96% 98% 98%   Weight change: -0.2 kg (-7.1 oz)  Intake/Output Summary (Last 24 hours) at 04/03/12 2055 Last data filed at 04/03/12 1300  Gross per 24 hour  Intake   2465 ml  Output   4150 ml  Net  -1685 ml    General: Alert, awake, oriented x3, in no acute distress.  HEENT: /AT PEERL, EOMI OROPHARYNX:  Moist, No exudate/ erythema/lesions.  Heart: Regular rate and rhythm, without murmurs, rubs, gallops.  Lungs: Clear to auscultation. Abdomen: Soft,miod RUQ tenderness,no guarding or rebound. Abdomen nondistended, positive bowel sounds, no masses no hepatosplenomegaly noted.  Neuro: No focal neurological deficits noted cranial nerves II through XII grossly intact. . Musculoskeletal: No warm swelling or erythema around joints, no spinal tenderness noted. Skin: And has a pea-sized wound on the distal right lower extremity which has hydrated no chronic tissue in the base of the wound. There is no erythema or warmth around the wound.  Data Reviewed: Basic Metabolic Panel:  Lab 04/03/12 2956 04/02/12 0356 04/01/12 1015 03/31/12 0400 03/30/12 0920  NA 140 137 133* 138 139  K 3.7 3.3* 4.0 3.9 3.4*  CL 106 101 97 105 104  CO2 27 30 23 26 22   GLUCOSE 102* 103* 138* 105* 98  BUN <3* <3* <3* 3* 4*  CREATININE 0.31* 0.36* 0.32* 0.39* 0.41*  CALCIUM 8.1* 7.8* 7.6* 7.4* 7.3*  MG -- -- -- 2.3 2.0  PHOS -- -- -- -- --   Liver Function Tests:  Lab 04/03/12 0357 04/02/12 0356 04/01/12 1015 03/31/12 0400 03/30/12 0920  AST 85* 121* 27 95* 121*  ALT 61* 57* 34 38* 44*  ALKPHOS 63 64 48 51 49  BILITOT 4.8* 7.1* 3.4* 2.8* 2.9*  PROT 5.8* 5.9* 6.0 6.4 6.1  ALBUMIN 3.0* 3.0* 3.3* 3.5 3.4*    Lab 03/31/12 0400 03/29/12 2206  LIPASE -- 30  AMYLASE 62 --   No results found for this basename: AMMONIA:5 in the last 168 hours CBC:  Lab 04/03/12 0357 04/02/12 1438 04/02/12 0356 04/01/12 1015 03/31/12  2338 03/31/12 0400 03/29/12 2206  WBC 8.9 -- 10.3 14.0* 14.9* 11.9* --  NEUTROABS 3.6 -- 6.6 6.7 -- 7.2 7.6  HGB 8.6* 8.3* 6.8* 7.1* 7.2* -- --  HCT 24.7* 22.9* 18.8* 19.4* 19.0* -- --  MCV 83.4 -- 81.4 79.2 77.6* 74.4* --  PLT 239 -- 224 233 219 196 --   Cardiac Enzymes: No results found for this basename: CKTOTAL:5,CKMB:5,CKMBINDEX:5,TROPONINI:5 in the last 168 hours BNP (last 3 results) No results found for this basename: PROBNP:3 in the last 8760 hours CBG: No results found for this basename: GLUCAP:5 in the last 168 hours  Recent Results (from the past 240 hour(s))  CULTURE, BLOOD (ROUTINE X 2)     Status: Normal (Preliminary result)   Collection Time   03/29/12 11:58 PM      Component Value Range Status Comment   Specimen Description BLOOD RIGHT FOREARM   Final    Special Requests  BOTTLES DRAWN AEROBIC ONLY 6CC   Final    Culture  Setup Time 03/30/2012 09:17   Final    Culture     Final    Value:        BLOOD CULTURE RECEIVED NO GROWTH TO DATE CULTURE WILL BE HELD FOR 5 DAYS BEFORE ISSUING A FINAL NEGATIVE REPORT   Report Status PENDING   Incomplete   CULTURE, BLOOD (ROUTINE X 2)     Status: Normal (Preliminary result)   Collection Time   03/30/12 12:05 AM      Component Value Range Status Comment   Specimen Description BLOOD LEFT FOREARM   Final    Special Requests BOTTLES DRAWN AEROBIC ONLY 10CC   Final    Culture  Setup Time 03/30/2012 09:16   Final    Culture     Final    Value:        BLOOD CULTURE RECEIVED NO GROWTH TO DATE CULTURE WILL BE HELD FOR 5 DAYS BEFORE ISSUING A FINAL NEGATIVE REPORT   Report Status PENDING   Incomplete   URINE CULTURE     Status: Normal   Collection Time   03/30/12  9:51 PM      Component Value Range Status Comment   Specimen Description URINE, CLEAN CATCH   Final    Special Requests NONE   Final    Culture  Setup Time 03/31/2012 03:06   Final    Colony Count NO GROWTH   Final    Culture NO GROWTH   Final    Report Status 04/01/2012  FINAL   Final      Studies: Dg Chest 1 View  03/30/2012  *RADIOLOGY REPORT*  Clinical Data: Chest pain  CHEST - 1 VIEW  Comparison: 03/29/2012  Findings: The heart is moderately enlarged.  Vascular congestion has developed.  Lungs under aerated with bibasilar atelectasis versus mild edema.  No pneumothorax.  Small pleural effusions are suspected.  IMPRESSION: Small pleural effusions suspected with bibasilar atelectasis versus mild edema.   Original Report Authenticated By: Jolaine Click, M.D.    Dg Chest 2 View  03/29/2012  *RADIOLOGY REPORT*  Clinical Data: Fever and emesis.  History of sickle cell disease.  CHEST - 2 VIEW  Comparison: 07/08/2010  Findings: Heart size is mildly enlarged.  There is no pleural effusion identified.  The bilateral, upper lobe predominant interstitial prominence is likely reflects scarring.  No superimposed airspace consolidation.  Bony changes of sickle cell disease noted.  IMPRESSION:  1.  No acute cardiopulmonary abnormalities noted.   Original Report Authenticated By: [name], M.D.    Dg Tibia/fibula Right  03/30/2012  *RADIOLOGY REPORT*  Clinical Data: Ulcer  RIGHT TIBIA AND FIBULA - 2 VIEW  Comparison: None.  Findings: No acute fracture and no dislocation.  No destructive bone lesion.  IMPRESSION: No acute bony pathology.   Original Report Authenticated By: Jolaine Click, M.D.    Dg Ankle Complete Right  03/30/2012  *RADIOLOGY REPORT*  Clinical Data: Medial ankle ulcer  RIGHT ANKLE - COMPLETE 3+ VIEW  Comparison: None.  Findings: No acute fracture or dislocation is noted.  No gross soft tissue abnormality is seen.  IMPRESSION: No acute abnormality noted.   Original Report Authenticated By: Alcide Clever, M.D.    US Abdomen Complete  03/30/2012  *RADIOLOGY REPORT*  Clinical Data:  Elevated LFTs, jaundice.  Nausea, vomiting.  COMPLETE ABDOMINAL ULTRASOUND  Comparison:  None.  Findings:  Gallbladder:  Multiple mobile gallstones within the gallbladder.  No wall  thickening.  Negative sonographic Murphy's.  Common bile duct:   Normal caliber, 3 mm.  Liver:  No focal lesion identified.  Within normal limits in parenchymal echogenicity.  IVC:  Appears normal.  Pancreas:  No focal abnormality seen.  Spleen:  Not definitively visualized.  Right Kidney:   Slightly increased echotexture.  13.8 cm.  No hydronephrosis or focal abnormality.  Left Kidney:  13.2 cm.  Slightly increased echotexture.  No hydronephrosis or focal abnormality.  Abdominal aorta:  No aneurysm identified.  IMPRESSION: Cholelithiasis.  No sonographic evidence of acute cholecystitis.   Original Report Authenticated By: Charlett Nose, M.D.    Dg Abd Portable 1v  03/31/2012  *RADIOLOGY REPORT*  Clinical Data: Nausea, vomiting, abdominal pain, sickle cell disease  PORTABLE ABDOMEN - 1 VIEW  Comparison: None.  Findings: Normal bowel gas pattern without obstruction or ileus. No abnormal calcifications or mass effect.  Pelvic calcifications consistent with venous phleboliths on the left.  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  IMPRESSION: Normal bowel gas pattern.   Original Report Authenticated By: Judie Petit. Shick, M.D.    Dg Foot Complete Right  03/30/2012  *RADIOLOGY REPORT*  Clinical Data: Ulcer at the malleolus  RIGHT FOOT COMPLETE - 3+ VIEW  Comparison: None.  Findings: Mild pes planus deformity.  No acute fracture and no dislocation.  No destructive bone lesion.  IMPRESSION: No acute bony pathology.   Original Report Authenticated By: Jolaine Click, M.D.     Scheduled Meds:    . antiseptic oral rinse  15 mL Mouth Rinse BID  . docusate sodium  100 mg Oral BID  . enoxaparin (LOVENOX) injection  40 mg Subcutaneous Q24H  . folic acid  1 mg Oral Daily  . levofloxacin (LEVAQUIN) IV  750 mg Intravenous Q24H  . pantoprazole  40 mg Oral Q1200  . polyethylene glycol  17 g Oral Daily  . sodium chloride  3 mL Intravenous Q12H   Continuous  Infusions:    . dextrose 5 % and 0.45 % NaCl with KCl 20 mEq/L 100 mL/hr at 04/03/12 1923    Principal Problem:  *Sickle cell anemia with pain Active Problems:  Sickle cell disease  Elevated LFTs  Abdominal  pain, other specified site  Nausea and vomiting in adult  Cholelithiasis  Anemia  Leukocytosis  Hypokalemia  Chronic wound of extremity  Constipation  Sickle cell hemolytic anemia  Sinus bradycardia by electrocardiogram

## 2012-04-03 NOTE — Progress Notes (Signed)
  Subjective: Tired this morning still with pain issues.  Objective: Vital signs in last 24 hours: Temp:  [97.2 F (36.2 C)-98.2 F (36.8 C)] 98.1 F (36.7 C) (11/15 0547) Pulse Rate:  [52-70] 53  (11/15 0547) Resp:  [16-18] 16  (11/15 0547) BP: (102-147)/(56-81) 147/81 mmHg (11/15 0547) SpO2:  [97 %-100 %] 100 % (11/15 0547) Weight:  [129 lb 13.6 oz (58.9 kg)] 129 lb 13.6 oz (58.9 kg) (11/15 0547) Last BM Date: 03/30/12  Intake/Output from previous day: 11/14 0701 - 11/15 0700 In: 3017.5 [P.O.:360; I.V.:2345; Blood:312.5] Out: 2150 [Urine:2150] Intake/Output this shift:    General appearance: alert, cooperative, fatigued and mild distress Chest: CTA Cardiac: RRR Abdomen: soft, non tender, no N/V/D, + BS flatus Labs: wbc wnl, h&H plts appear stable.  Lab Results:   Basename 04/03/12 0357 04/02/12 1438 04/02/12 0356  WBC 8.9 -- 10.3  HGB 8.6* 8.3* --  HCT 24.7* 22.9* --  PLT 239 -- 224   BMET  Basename 04/03/12 0357 04/02/12 0356  NA 140 137  K 3.7 3.3*  CL 106 101  CO2 27 30  GLUCOSE 102* 103*  BUN <3* <3*  CREATININE 0.31* 0.36*  CALCIUM 8.1* 7.8*   PT/INR No results found for this basename: LABPROT:2,INR:2 in the last 72 hours ABG No results found for this basename: PHART:2,PCO2:2,PO2:2,HCO3:2 in the last 72 hours  Studies/Results: No results found.  Anti-infectives: Anti-infectives     Start     Dose/Rate Route Frequency Ordered Stop   03/30/12 1200   levofloxacin (LEVAQUIN) IVPB 750 mg        750 mg 100 mL/hr over 90 Minutes Intravenous Every 24 hours 03/30/12 1049            Assessment/Plan: s/p * No surgery found * Cholelithiasis/no GB wall thickening,CBD 3 mm, Mild alk phos, transaminases elevation, rising bilirubin  Sickle cell with crisis  Severe anemia with sickle cell crisis  Plan: Cholecystectomy as an elective procedure after she recovers from crisis.   LOS: 5 days    Blenda Mounts [facility] Pager  [phone]  04/03/2012

## 2012-04-03 NOTE — Progress Notes (Signed)
Pt has 2 iv sites that will be out of date after midnight.  She refuses to let us change them at this time.

## 2012-04-04 ENCOUNTER — Inpatient Hospital Stay (HOSPITAL_COMMUNITY): Payer: Medicaid - Out of State

## 2012-04-04 LAB — CBC WITH DIFFERENTIAL/PLATELET
Basophils Relative: 2 % — ABNORMAL HIGH (ref 0–1)
Eosinophils Absolute: 0.4 10*3/uL (ref 0.0–0.7)
Eosinophils Relative: 5 % (ref 0–5)
HCT: 25.3 % — ABNORMAL LOW (ref 36.0–46.0)
Hemoglobin: 8.9 g/dL — ABNORMAL LOW (ref 12.0–15.0)
Lymphocytes Relative: 35 % (ref 12–46)
MCH: 29.7 pg (ref 26.0–34.0)
MCHC: 35.2 g/dL (ref 30.0–36.0)
Neutro Abs: 4.2 10*3/uL (ref 1.7–7.7)

## 2012-04-04 LAB — COMPREHENSIVE METABOLIC PANEL
ALT: 59 U/L — ABNORMAL HIGH (ref 0–35)
AST: 76 U/L — ABNORMAL HIGH (ref 0–37)
Alkaline Phosphatase: 65 U/L (ref 39–117)
CO2: 25 mEq/L (ref 19–32)
Chloride: 106 mEq/L (ref 96–112)
Creatinine, Ser: 0.33 mg/dL — ABNORMAL LOW (ref 0.50–1.10)
GFR calc non Af Amer: >90 mL/min (ref >90)
Potassium: 3.7 mEq/L (ref 3.5–5.1)
Total Bilirubin: 4 mg/dL — ABNORMAL HIGH (ref 0.3–1.2)

## 2012-04-04 MED ORDER — TECHNETIUM TC 99M MEBROFENIN IV KIT
5.5000 | PACK | Freq: Once | INTRAVENOUS | Status: AC | PRN
  Administered 2012-04-04: 5.5 via INTRAVENOUS

## 2012-04-04 NOTE — Progress Notes (Signed)
PROGRESS NOTE  Syniya Cui ZOX:[id number] DOB: [birth year] DOA: 03/29/2012 PCP: No primary provider on file.  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 Hemoglobin electrophoresis in process. Pt. has recently moved to the area. She states that her sickle cell anemia has been managed at [facility] in [place]. She states that her normal hemoglobin usually resides around 7. Patient is status post transfusion of one unit packed blood cells. Her hemoglobin today stable at 8.9.    Sickle cell anemia with pain   Pt. Has had only minimal use of oxycodone since discontinuation of PCA yesterday. Toradol for anti-inflammatory effects in controlling pain discontinued 04/03/2012.  She is anxious to get home to her 73 month old daughter.    Bradycardia: Pt was found to be bradycardic on examination, however this appears to be symptomatic. She is not on any rate limiting medications. Despite the low HR, her BP is supported. A 12-lead EKG shows only bradycardia without AVB.   Fever: Pt has been afebrile sine yesterday.   Constipation: Patient reports 3 bowel movements within the last day. However her oral intake is poor and she says she does not have an appetite at this time to   Elevated LFTs: Been evaluated by general surgery is that the elevation of the LFTs are secondary to vaso-occlusive episode and not consistent with acute cholecystitis. However I am concerned that the acute elevation of LFT's, RUQ pain and episode of fever could represent an acute process in abdomen. HIDA scan is pending at this time. I appreciate the input from Dr. Biagio Quint  Nausea:  Currently patient denies any vomiting but states that she has some persistent nausea. This appears to be well controlled with Zofran. We'll continue to assess. The patient  is on a regular diet however her oral intake is very poor as she lacks an appetite the  Cholelithiasis: Patient has been evaluated by general surgery. They will follow up with patient's an outpatient when she has had resolution of her acute vaso-occlusive episode.     Leukocytosis:  There was a mild leukocytosis without any evidence of infection. I suspect that this was inflammatory owing to her acute vaso-occlusive episode.Now resolved.   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 It is pea-sized and appears to have dehydrated necrotic tissue at the base of the wound. She reports that this has been there for several months. Appreciate  wound ostomy care recommendations.   Code Status: Full Code Family Communication: And oriented x3 capable of discussing her medical information at will Disposition Plan: Home at the time of discharge  Senita Corredor A.  Pager [phone] If 7PM-7AM, please contact night-coverage. 04/04/2012, 7:04 PM  LOS: 6 days   Brief narrative: 41 yo female h/o sickle cell disease from [place] comes in with 4 days of n/v nonbloody with epi abd pain which is not typical of her sickle cell crisis pain. She normally has back pain with joint pain in her upper ext and not abd pain. No brbpr no diarrhea. No fevers. No dysuria. No rashes. Has not been hosp for over a year and a half. Still has gallbladder. Vomit is bilious. Her baseline hgb is between 7-8. It is down to 6.4. She is normally not jaundiced except during acute crisis.   Consultants:  None  Procedures:  None  Antibiotics:  None  HPI/Subjective: Patient states that she feels a little better today however still identified in the right upper quadrant as the most prominent pain that she has. Please note that the patient's pain associated sickle cell U. she occurs in the joints and at present she has no joint pain.   Objective:  04/01/12 1112  04/01/12 1425 04/02/12 1401  BP: 125/68 111/65 130/70  Pulse: 65 66 48  Temp: 98.5 F (36.9 C) 98.1 F (36.7 C) 98.4 F (36.9 C)  TempSrc: Oral Oral Oral  Resp: 20 20 16   Height:     Weight:     SpO2: 96% 98% 98%   Weight change:   Intake/Output Summary (Last 24 hours) at 04/04/12 1904 Last data filed at 04/04/12 1524  Gross per 24 hour  Intake   2643 ml  Output   2650 ml  Net     -7 ml    General: Alert, awake, oriented x3, in no acute distress.   Heart: Regular rate and rhythm, without murmurs, rubs, gallops.  Lungs: Clear to auscultation. Abdomen: Soft, RUQ tenderness,no guarding or rebound. Abdomen nondistended, positive bowel sounds, no masses no hepatosplenomegaly noted.  Neuro: Cranial nerves 2-12 grossly intact. Patient able to ambulate around the room without difficulty Musculoskeletal: No warm swelling or erythema around joints, no spinal tenderness noted. Skin: And has a pea-sized wound on the distal right lower extremity which has hydrated no chronic tissue in the base of the wound. There is no erythema or warmth around the wound.  Data Reviewed: Basic Metabolic Panel:  Lab 04/04/12 1610 04/03/12 0357 04/02/12 0356 04/01/12 1015 03/31/12 0400 03/30/12 0920  NA 140 140 137 133* 138 --  K 3.7 3.7 3.3* 4.0 3.9 --  CL 106 106 101 97 105 --  CO2 25 27 30 23 26  --  GLUCOSE 100* 102* 103* 138* 105* --  BUN <3* <3* <3* <3* 3* --  CREATININE 0.33* 0.31* 0.36* 0.32* 0.39* --  CALCIUM 8.6 8.1* 7.8* 7.6* 7.4* --  MG -- -- -- -- 2.3 2.0  PHOS -- -- -- -- -- --   Liver Function Tests:  Lab 04/04/12 0411 04/03/12 0357 04/02/12 0356 04/01/12 1015 03/31/12 0400  AST 76* 85* 121* 27 95*  ALT 59* 61* 57* 34 38*  ALKPHOS 65 63 64 48 51  BILITOT 4.0* 4.8* 7.1* 3.4* 2.8*  PROT 6.0 5.8* 5.9* 6.0 6.4  ALBUMIN 3.0* 3.0* 3.0* 3.3* 3.5    Lab 03/31/12 0400 03/29/12 2206  LIPASE -- 30  AMYLASE 62 --   No results found for this basename: AMMONIA:5 in the last 168  hours CBC:  Lab 04/04/12 0411 04/03/12 0357 04/02/12 1438 04/02/12 0356 04/01/12 1015 03/31/12 2338 03/31/12 0400  WBC 8.9 8.9 -- 10.3 14.0* 14.9* --  NEUTROABS 4.2 3.6 -- 6.6 6.7 -- 7.2  HGB 8.9* 8.6* 8.3* 6.8* 7.1* -- --  HCT 25.3* 24.7* 22.9* 18.8* 19.4* -- --  MCV 84.3 83.4 -- 81.4 79.2 77.6* --  PLT 233 239 -- 224 233 219 --   Cardiac Enzymes: No results found for this basename: CKTOTAL:5,CKMB:5,CKMBINDEX:5,TROPONINI:5 in the last 168 hours BNP (last 3 results) No results found for this basename: PROBNP:3 in the last 8760 hours CBG: No results found for this basename: GLUCAP:5 in the last 168 hours  Recent Results (from the past 240 hour(s))  CULTURE, BLOOD (ROUTINE X 2)     Status: Normal (Preliminary result)  Collection Time   03/29/12 11:58 PM      Component Value Range Status Comment   Specimen Description BLOOD RIGHT FOREARM   Final    Special Requests BOTTLES DRAWN AEROBIC ONLY 6CC   Final    Culture  Setup Time 03/30/2012 09:17   Final    Culture     Final    Value:        BLOOD CULTURE RECEIVED NO GROWTH TO DATE CULTURE WILL BE HELD FOR 5 DAYS BEFORE ISSUING A FINAL NEGATIVE REPORT   Report Status PENDING   Incomplete   CULTURE, BLOOD (ROUTINE X 2)     Status: Normal (Preliminary result)   Collection Time   03/30/12 12:05 AM      Component Value Range Status Comment   Specimen Description BLOOD LEFT FOREARM   Final    Special Requests BOTTLES DRAWN AEROBIC ONLY 10CC   Final    Culture  Setup Time 03/30/2012 09:16   Final    Culture     Final    Value:        BLOOD CULTURE RECEIVED NO GROWTH TO DATE CULTURE WILL BE HELD FOR 5 DAYS BEFORE ISSUING A FINAL NEGATIVE REPORT   Report Status PENDING   Incomplete   URINE CULTURE     Status: Normal   Collection Time   03/30/12  9:51 PM      Component Value Range Status Comment   Specimen Description URINE, CLEAN CATCH   Final    Special Requests NONE   Final    Culture  Setup Time 03/31/2012 03:06   Final    Colony  Count NO GROWTH   Final    Culture NO GROWTH   Final    Report Status 04/01/2012 FINAL   Final      Studies: Dg Chest 1 View  03/30/2012  *RADIOLOGY REPORT*  Clinical Data: Chest pain  CHEST - 1 VIEW  Comparison: 03/29/2012  Findings: The heart is moderately enlarged.  Vascular congestion has developed.  Lungs under aerated with bibasilar atelectasis versus mild edema.  No pneumothorax.  Small pleural effusions are suspected.  IMPRESSION: Small pleural effusions suspected with bibasilar atelectasis versus mild edema.   Original Report Authenticated By: Jolaine Click, M.D.    Dg Chest 2 View  03/29/2012  *RADIOLOGY REPORT*  Clinical Data: Fever and emesis.  History of sickle cell disease.  CHEST - 2 VIEW  Comparison: 07/08/2010  Findings: Heart size is mildly enlarged.  There is no pleural effusion identified.  The bilateral, upper lobe predominant interstitial prominence is likely reflects scarring.  No superimposed airspace consolidation.  Bony changes of sickle cell disease noted.  IMPRESSION:  1.  No acute cardiopulmonary abnormalities noted.   Original Report Authenticated By: [name], M.D.    Dg Tibia/fibula Right  03/30/2012  *RADIOLOGY REPORT*  Clinical Data: Ulcer  RIGHT TIBIA AND FIBULA - 2 VIEW  Comparison: None.  Findings: No acute fracture and no dislocation.  No destructive bone lesion.  IMPRESSION: No acute bony pathology.   Original Report Authenticated By: Jolaine Click, M.D.    Dg Ankle Complete Right  03/30/2012  *RADIOLOGY REPORT*  Clinical Data: Medial ankle ulcer  RIGHT ANKLE - COMPLETE 3+ VIEW  Comparison: None.  Findings: No acute fracture or dislocation is noted.  No gross soft tissue abnormality is seen.  IMPRESSION: No acute abnormality noted.   Original Report Authenticated By: Alcide Clever, M.D.    US Abdomen Complete  03/30/2012  *  RADIOLOGY REPORT*  Clinical Data:  Elevated LFTs, jaundice.  Nausea, vomiting.  COMPLETE ABDOMINAL ULTRASOUND  Comparison:  None.   Findings:  Gallbladder:  Multiple mobile gallstones within the gallbladder. No wall thickening.  Negative sonographic Murphy's.  Common bile duct:   Normal caliber, 3 mm.  Liver:  No focal lesion identified.  Within normal limits in parenchymal echogenicity.  IVC:  Appears normal.  Pancreas:  No focal abnormality seen.  Spleen:  Not definitively visualized.  Right Kidney:   Slightly increased echotexture.  13.8 cm.  No hydronephrosis or focal abnormality.  Left Kidney:  13.2 cm.  Slightly increased echotexture.  No hydronephrosis or focal abnormality.  Abdominal aorta:  No aneurysm identified.  IMPRESSION: Cholelithiasis.  No sonographic evidence of acute cholecystitis.   Original Report Authenticated By: Charlett Nose, M.D.    Dg Abd Portable 1v  03/31/2012  *RADIOLOGY REPORT*  Clinical Data: Nausea, vomiting, abdominal pain, sickle cell disease  PORTABLE ABDOMEN - 1 VIEW  Comparison: None.  Findings: Normal bowel gas pattern without obstruction or ileus. No abnormal calcifications or mass effect.  Pelvic calcifications consistent with venous phleboliths on the left.  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  IMPRESSION: Normal bowel gas pattern.   Original Report Authenticated By: Judie Petit. Shick, M.D.    Dg Foot Complete Right  03/30/2012  *RADIOLOGY REPORT*  Clinical Data: Ulcer at the malleolus  RIGHT FOOT COMPLETE - 3+ VIEW  Comparison: None.  Findings: Mild pes planus deformity.  No acute fracture and no dislocation.  No destructive bone lesion.  IMPRESSION: No acute bony pathology.   Original Report Authenticated By: Jolaine Click, M.D.     Scheduled Meds:    . antiseptic oral rinse  15 mL Mouth Rinse BID  . docusate sodium  100 mg Oral BID  . enoxaparin (LOVENOX) injection  40 mg Subcutaneous Q24H  . folic acid  1 mg Oral Daily  . levofloxacin (LEVAQUIN) IV  750 mg Intravenous Q24H  . pantoprazole  40 mg Oral Q1200  . polyethylene glycol   17 g Oral Daily  . sodium chloride  3 mL Intravenous Q12H   Continuous Infusions:    . dextrose 5 % and 0.45 % NaCl with KCl 20 mEq/L 100 mL/hr at 04/04/12 1631    Principal Problem:  *Sickle cell anemia with pain Active Problems:  Sickle cell disease  Elevated LFTs  Abdominal  pain, other specified site  Nausea and vomiting in adult  Cholelithiasis  Anemia  Leukocytosis  Hypokalemia  Chronic wound of extremity  Constipation  Sickle cell hemolytic anemia  Sinus bradycardia by electrocardiogram

## 2012-04-04 NOTE — Progress Notes (Signed)
  Subjective: Pain about the same.  Objective: Vital signs in last 24 hours: Temp:  [97.6 F (36.4 C)-98.4 F (36.9 C)] 98 F (36.7 C) (11/16 0645) Pulse Rate:  [44-58] 44  (11/16 0645) Resp:  [16] 16  (11/16 0645) BP: (130-153)/(70-80) 153/74 mmHg (11/16 0645) SpO2:  [98 %-100 %] 99 % (11/16 0645) Last BM Date: 04/03/12  Intake/Output from previous day: 11/15 0701 - 11/16 0700 In: 2400 [I.V.:2400] Out: 4500 [Urine:4500] Intake/Output this shift:    General appearance: cooperative and no distress GI: soft, focal RUQ tenderness, ND  Lab Results:   [facility] 04/04/12 0411 04/03/12 0357  WBC 8.9 8.9  HGB 8.9* 8.6*  HCT 25.3* 24.7*  PLT 233 239   BMET  Basename 04/04/12 0411 04/03/12 0357  NA 140 140  K 3.7 3.7  CL 106 106  CO2 25 27  GLUCOSE 100* 102*  BUN <3* <3*  CREATININE 0.33* 0.31*  CALCIUM 8.6 8.1*   PT/INR No results found for this basename: LABPROT:2,INR:2 in the last 72 hours ABG No results found for this basename: PHART:2,PCO2:2,PO2:2,HCO3:2 in the last 72 hours  Studies/Results: No results found.  Anti-infectives: Anti-infectives     Start     Dose/Rate Route Frequency Ordered Stop   03/30/12 1200   levofloxacin (LEVAQUIN) IVPB 750 mg        750 mg 100 mL/hr over 90 Minutes Intravenous Every 24 hours 03/30/12 1049            Assessment/Plan: s/p * No surgery found * I am concerned that this may be due to her gallbladder.  HIDA pending.  I think that she will likely need cholecystectomy. Will see what HIDA shows  LOS: 6 days    Lodema Pilot DAVID 04/04/2012

## 2012-04-05 LAB — VITAMIN D 1,25 DIHYDROXY
Vitamin D 1, 25 (OH)2 Total: 84 pg/mL — ABNORMAL HIGH (ref 18–72)
Vitamin D2 1, 25 (OH)2: <8 pg/mL
Vitamin D3 1, 25 (OH)2: 84 pg/mL

## 2012-04-05 LAB — CBC WITH DIFFERENTIAL/PLATELET
Basophils Absolute: 0 10*3/uL (ref 0.0–0.1)
Eosinophils Absolute: 0.6 10*3/uL (ref 0.0–0.7)
Lymphs Abs: 2.4 10*3/uL (ref 0.7–4.0)
MCH: 29.7 pg (ref 26.0–34.0)
MCHC: 34.9 g/dL (ref 30.0–36.0)
MCV: 85 fL (ref 78.0–100.0)
Monocytes Absolute: 1 10*3/uL (ref 0.1–1.0)
Neutro Abs: 4.1 10*3/uL (ref 1.7–7.7)
Neutrophils Relative %: 51 % (ref 43–77)
Platelets: 280 10*3/uL (ref 150–400)
RBC: 3.27 MIL/uL — ABNORMAL LOW (ref 3.87–5.11)
RDW: 22 % — ABNORMAL HIGH (ref 11.5–15.5)

## 2012-04-05 LAB — COMPREHENSIVE METABOLIC PANEL
ALT: 62 U/L — ABNORMAL HIGH (ref 0–35)
BUN: 3 mg/dL — ABNORMAL LOW (ref 6–23)
CO2: 24 mEq/L (ref 19–32)
Calcium: 9.3 mg/dL (ref 8.4–10.5)
GFR calc Af Amer: >90 mL/min (ref >90)
GFR calc non Af Amer: >90 mL/min (ref >90)
Glucose, Bld: 105 mg/dL — ABNORMAL HIGH (ref 70–99)
Total Protein: 6.9 g/dL (ref 6.0–8.3)

## 2012-04-05 LAB — CULTURE, BLOOD (ROUTINE X 2): Culture: NO GROWTH

## 2012-04-05 MED ORDER — LEVOFLOXACIN 750 MG PO TABS
750.0000 mg | ORAL_TABLET | Freq: Every day | ORAL | Status: DC
  Filled 2012-04-05: qty 1

## 2012-04-05 NOTE — Progress Notes (Signed)
  Subjective: She says that she feels better.  Decreased abdominal pain.  Tolerating liquids.  Objective: Vital signs in last 24 hours: Temp:  [98.3 F (36.8 C)-98.8 F (37.1 C)] 98.5 F (36.9 C) (11/17 0535) Pulse Rate:  [52-80] 56  (11/17 0535) Resp:  [16] 16  (11/17 0535) BP: (120-152)/(69-83) 131/69 mmHg (11/17 0535) SpO2:  [96 %-98 %] 96 % (11/17 0535) Last BM Date: 04/04/12  Intake/Output from previous day: 11/16 0701 - 11/17 0700 In: 2587 [P.O.:440; I.V.:1997; IV Piggyback:150] Out: 650 [Urine:650] Intake/Output this shift:    General appearance: alert, cooperative and no distress GI: soft, mild RUQ tenderness which is improved from exam yesterday, ND, no peritoneal signs  Lab Results:   [facility] 04/05/12 0412 04/04/12 0411  WBC 8.1 8.9  HGB 9.7* 8.9*  HCT 27.8* 25.3*  PLT 280 233   BMET  Basename 04/05/12 0412 04/04/12 0411  NA 138 140  K 3.3* 3.7  CL 103 106  CO2 24 25  GLUCOSE 105* 100*  BUN 3* <3*  CREATININE 0.30* 0.33*  CALCIUM 9.3 8.6   PT/INR No results found for this basename: LABPROT:2,INR:2 in the last 72 hours ABG No results found for this basename: PHART:2,PCO2:2,PO2:2,HCO3:2 in the last 72 hours  Studies/Results: Nm Hepatobiliary Liver Func  04/04/2012  *RADIOLOGY REPORT*  Clinical Data: Right upper quadrant pain, abnormal LFTs  NUCLEAR MEDICINE HEPATOHBILIARY INCLUDE GB  Radiopharmaceutical:  5.5 mCi Tc68m Choletec IV  Comparison: Correlation with ultrasound abdomen dated 03/30/2012.  Findings: Filling of the gallbladder within 15 minutes.  Small bowel is visualized within 20 minutes.  This appearance confirms cystic duct patency.  Mildly delayed hepatic excretion of radiotracer by 60 minutes.  IMPRESSION: No evidence of acute cholecystitis.  Mildly delayed hepatic excretion, raising the possibility of hepatocellular dysfunction.   Original Report Authenticated By: Charline Bills, M.D.     Anti-infectives: Anti-infectives     Start      Dose/Rate Route Frequency Ordered Stop   03/30/12 1200   levofloxacin (LEVAQUIN) IVPB 750 mg        750 mg 100 mL/hr over 90 Minutes Intravenous Every 24 hours 03/30/12 1049            Assessment/Plan: s/p * No surgery found * This certainly could be due to cholelithiasis but she does not have a clear picture especially with sickle cell crisis.  Her HIDA was negative for cholecystitis and negative for CBD obstruction yet her bilirubin is increased today and her pain is improved.  I think that she would benefit from cholecystectomy but the question will be timing of this.  Outpatient vs. Inpatient.  There is no sign that this needs to be done urgently.  Workup of hyperbilirubinemia pending.  We will see what comes of this workup.  LOS: 7 days    Lodema Pilot DAVID 04/05/2012

## 2012-04-05 NOTE — Progress Notes (Signed)
Pt ambulated a full lap around the unit, steady gait and moderate pace. Pt tolerated ambulation well.  Pt back to bed without incidence, will continue to monitor.

## 2012-04-05 NOTE — Progress Notes (Signed)
PROGRESS NOTE  [name] ZOX:[id number] DOB: 01/28/1971 DOA: 03/29/2012 PCP: No primary provider on file.  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 Hemoglobin electrophoresis in process. Pt. has recently moved to the area. She states that her sickle cell anemia has been managed at [facility] in [place]. She states that her normal hemoglobin usually resides around 7. Patient is status post transfusion of one unit packed blood cells. Her hemoglobin today stable at 8.9.    Sickle cell anemia with pain   Pt. Has had only minimal use of oxycodone since discontinuation of PCA. Toradol for anti-inflammatory effects in controlling pain discontinued 04/03/2012.    Bradycardia: Pt was found to be bradycardic on examination, however this appears to have  been symptomatic and is now resolved.    Fever: Now afebrile   Constipation: Patient reports 3 bowel movements within the last day.    Elevated LFTs: Been evaluated by general surgery is that the elevation of the LFTs are secondary to vaso-occlusive episode and not consistent with acute cholecystitis.  HIDA scan is negative.  appreciate the input from Dr. Biagio Quint  Nausea:  Currently patient denies any nausea or vomiting. Diet advanced  Cholelithiasis: Patient has been evaluated by general surgery. They will follow up with patient's an outpatient when she has had resolution of her acute vaso-occlusive episode.     Leukocytosis:  There was a mild leukocytosis without any evidence of infection. I suspect that this was inflammatory owing to her acute vaso-occlusive episode.Now resolved.   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 It is pea-sized and appears to have dehydrated necrotic tissue at the base of the wound. She reports that this has been there for  several months. Appreciate  wound ostomy care recommendations.   Code Status: Full Code Family Communication: And oriented x3 capable of discussing her medical information at will Disposition Plan: Home at the time of discharge  [name]  Pager [phone] If 7PM-7AM, please contact night-coverage. 04/05/2012, 6:13 PM  LOS: 7 days   Brief narrative: 41 yo female h/o sickle cell disease from [place] comes in with 4 days of n/v nonbloody with epi abd pain which is not typical of her sickle cell crisis pain. She normally has back pain with joint pain in her upper ext and not abd pain. No brbpr no diarrhea. No fevers. No dysuria. No rashes. Has not been hosp for over a year and a half. Still has gallbladder. Vomit is bilious. Her baseline hgb is between 7-8. It is down to 6.4. She is normally not jaundiced except during acute crisis.   Consultants:  None  Procedures:  None  Antibiotics:  None  HPI/Subjective: Patient states that she has no pain today.  Objective:  04/05/12 1301  BP: 102/70  Pulse: 83  Temp: 98.4 F (36.9 C)  TempSrc: Oral  Resp: 16  Height:   Weight:   SpO2: 99%   Weight change:   Intake/Output Summary (Last 24 hours) at 04/05/12 1813 Last data filed at 04/05/12 1020  Gross per 24 hour  Intake   2344 ml  Output    500 ml  Net   1844 ml    General: Alert, awake, oriented x3, in no acute distress.   Heart: Regular rate and rhythm, without murmurs, rubs, gallops.  Lungs: Clear to auscultation. Abdomen:  Soft, non-tender,no guarding or rebound. Abdomen nondistended, positive bowel sounds, no masses no hepatosplenomegaly noted.  Neuro: Cranial nerves 2-12 grossly intact. Patient able to ambulate around the room without difficulty Musculoskeletal: No warm swelling or erythema around joints, no spinal tenderness noted. Skin: And has a pea-sized wound on the distal right lower extremity which has hydrated no chronic tissue in the base of the  wound. There is no erythema or warmth around the wound.  Data Reviewed: Basic Metabolic Panel:  Lab 04/05/12 1478 04/04/12 0411 04/03/12 0357 04/02/12 0356 04/01/12 1015 03/31/12 0400 03/30/12 0920  NA 138 140 140 137 133* -- --  K 3.3* 3.7 3.7 3.3* 4.0 -- --  CL 103 106 106 101 97 -- --  CO2 24 25 27 30 23  -- --  GLUCOSE 105* 100* 102* 103* 138* -- --  BUN 3* <3* <3* <3* <3* -- --  CREATININE 0.30* 0.33* 0.31* 0.36* 0.32* -- --  CALCIUM 9.3 8.6 8.1* 7.8* 7.6* -- --  MG -- -- -- -- -- 2.3 2.0  PHOS -- -- -- -- -- -- --   Liver Function Tests:  Lab 04/05/12 0412 04/04/12 0411 04/03/12 0357 04/02/12 0356 04/01/12 1015  AST 68* 76* 85* 121* 27  ALT 62* 59* 61* 57* 34  ALKPHOS 78 65 63 64 48  BILITOT 5.4* 4.0* 4.8* 7.1* 3.4*  PROT 6.9 6.0 5.8* 5.9* 6.0  ALBUMIN 3.4* 3.0* 3.0* 3.0* 3.3*    Lab 03/31/12 0400 03/29/12 2206  LIPASE -- 30  AMYLASE 62 --   No results found for this basename: AMMONIA:5 in the last 168 hours CBC:  Lab 04/05/12 0412 04/04/12 0411 04/03/12 0357 04/02/12 1438 04/02/12 0356 04/01/12 1015  WBC 8.1 8.9 8.9 -- 10.3 14.0*  NEUTROABS 4.1 4.2 3.6 -- 6.6 6.7  HGB 9.7* 8.9* 8.6* 8.3* 6.8* --  HCT 27.8* 25.3* 24.7* 22.9* 18.8* --  MCV 85.0 84.3 83.4 -- 81.4 79.2  PLT 280 233 239 -- 224 233   Cardiac Enzymes: No results found for this basename: CKTOTAL:5,CKMB:5,CKMBINDEX:5,TROPONINI:5 in the last 168 hours BNP (last 3 results) No results found for this basename: PROBNP:3 in the last 8760 hours CBG: No results found for this basename: GLUCAP:5 in the last 168 hours  Recent Results (from the past 240 hour(s))  CULTURE, BLOOD (ROUTINE X 2)     Status: Normal   Collection Time   03/29/12 11:58 PM      Component Value Range Status Comment   Specimen Description BLOOD RIGHT FOREARM   Final    Special Requests BOTTLES DRAWN AEROBIC ONLY [facility]   Final    Culture  Setup Time 03/30/2012 09:17   Final    Culture NO GROWTH 5 DAYS   Final    Report Status  04/05/2012 FINAL   Final   CULTURE, BLOOD (ROUTINE X 2)     Status: Normal   Collection Time   03/30/12 12:05 AM      Component Value Range Status Comment   Specimen Description BLOOD LEFT FOREARM   Final    Special Requests BOTTLES DRAWN AEROBIC ONLY [facility]CC   Final    Culture  Setup Time 03/30/2012 09:16   Final    Culture NO GROWTH 5 DAYS   Final    Report Status 04/05/2012 FINAL   Final   URINE CULTURE     Status: Normal   Collection Time   03/30/12  9:51 PM      Component Value Range Status Comment  Specimen Description URINE, CLEAN CATCH   Final    Special Requests NONE   Final    Culture  Setup Time 03/31/2012 03:06   Final    Colony Count NO GROWTH   Final    Culture NO GROWTH   Final    Report Status 04/01/2012 FINAL   Final      Studies: Dg Chest 1 View  03/30/2012  *RADIOLOGY REPORT*  Clinical Data: Chest pain  CHEST - 1 VIEW  Comparison: 03/29/2012  Findings: The heart is moderately enlarged.  Vascular congestion has developed.  Lungs under aerated with bibasilar atelectasis versus mild edema.  No pneumothorax.  Small pleural effusions are suspected.  IMPRESSION: Small pleural effusions suspected with bibasilar atelectasis versus mild edema.   Original Report Authenticated By: Jolaine Click, M.D.    Dg Chest 2 View  03/29/2012  *RADIOLOGY REPORT*  Clinical Data: Fever and emesis.  History of sickle cell disease.  CHEST - 2 VIEW  Comparison: 07/08/2010  Findings: Heart size is mildly enlarged.  There is no pleural effusion identified.  The bilateral, upper lobe predominant interstitial prominence is likely reflects scarring.  No superimposed airspace consolidation.  Bony changes of sickle cell disease noted.  IMPRESSION:  1.  No acute cardiopulmonary abnormalities noted.   Original Report Authenticated By: [name], M.D.    Dg Tibia/fibula Right  03/30/2012  *RADIOLOGY REPORT*  Clinical Data: Ulcer  RIGHT TIBIA AND FIBULA - 2 VIEW  Comparison: None.  Findings: No acute  fracture and no dislocation.  No destructive bone lesion.  IMPRESSION: No acute bony pathology.   Original Report Authenticated By: Jolaine Click, M.D.    Dg Ankle Complete Right  03/30/2012  *RADIOLOGY REPORT*  Clinical Data: Medial ankle ulcer  RIGHT ANKLE - COMPLETE 3+ VIEW  Comparison: None.  Findings: No acute fracture or dislocation is noted.  No gross soft tissue abnormality is seen.  IMPRESSION: No acute abnormality noted.   Original Report Authenticated By: Alcide Clever, M.D.    US Abdomen Complete  03/30/2012  *RADIOLOGY REPORT*  Clinical Data:  Elevated LFTs, jaundice.  Nausea, vomiting.  COMPLETE ABDOMINAL ULTRASOUND  Comparison:  None.  Findings:  Gallbladder:  Multiple mobile gallstones within the gallbladder. No wall thickening.  Negative sonographic Murphy's.  Common bile duct:   Normal caliber, 3 mm.  Liver:  No focal lesion identified.  Within normal limits in parenchymal echogenicity.  IVC:  Appears normal.  Pancreas:  No focal abnormality seen.  Spleen:  Not definitively visualized.  Right Kidney:   Slightly increased echotexture.  13.8 cm.  No hydronephrosis or focal abnormality.  Left Kidney:  13.2 cm.  Slightly increased echotexture.  No hydronephrosis or focal abnormality.  Abdominal aorta:  No aneurysm identified.  IMPRESSION: Cholelithiasis.  No sonographic evidence of acute cholecystitis.   Original Report Authenticated By: Charlett Nose, M.D.    Dg Abd Portable 1v  03/31/2012  *RADIOLOGY REPORT*  Clinical Data: Nausea, vomiting, abdominal pain, sickle cell disease  PORTABLE ABDOMEN - 1 VIEW  Comparison: None.  Findings: Normal bowel gas pattern without obstruction or ileus. No abnormal calcifications or mass effect.  Pelvic calcifications consistent with venous phleboliths on the left.  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  IMPRESSION: Normal bowel gas pattern.   Original Report Authenticated By: Judie Petit. Shick, M.D.     Dg Foot Complete Right  03/30/2012  *RADIOLOGY REPORT*  Clinical Data: Ulcer at the malleolus  RIGHT FOOT COMPLETE - 3+ VIEW  Comparison: None.  Findings: Mild pes planus deformity.  No acute fracture and no dislocation.  No destructive bone lesion.  IMPRESSION: No acute bony pathology.   Original Report Authenticated By: Jolaine Click, M.D.     Scheduled Meds:    . antiseptic oral rinse  15 mL Mouth Rinse BID  . docusate sodium  100 mg Oral BID  . enoxaparin (LOVENOX) injection  40 mg Subcutaneous Q24H  . folic acid  1 mg Oral Daily  . levofloxacin (LEVAQUIN) IV  750 mg Intravenous Q24H  . pantoprazole  40 mg Oral Q1200  . polyethylene glycol  17 g Oral Daily  . sodium chloride  3 mL Intravenous Q12H   Continuous Infusions:    . dextrose 5 % and 0.45 % NaCl with KCl 20 mEq/L 150 mL/hr at 04/05/12 1035    Principal Problem:  *Sickle cell anemia with pain Active Problems:  Sickle cell disease  Elevated LFTs  Abdominal  pain, other specified site  Nausea and vomiting in adult  Cholelithiasis  Anemia  Leukocytosis  Hypokalemia  Chronic wound of extremity  Constipation  Sickle cell hemolytic anemia  Sinus bradycardia by electrocardiogram

## 2012-04-06 LAB — CBC WITH DIFFERENTIAL/PLATELET
Band Neutrophils: 0 % (ref 0–10)
Blasts: 0 %
HCT: 31 % — ABNORMAL LOW (ref 36.0–46.0)
Hemoglobin: 10.5 g/dL — ABNORMAL LOW (ref 12.0–15.0)
Lymphocytes Relative: 30 % (ref 12–46)
Lymphs Abs: 3.2 10*3/uL (ref 0.7–4.0)
MCHC: 33.9 g/dL (ref 30.0–36.0)
Monocytes Absolute: 0.6 10*3/uL (ref 0.1–1.0)
Monocytes Relative: 6 % (ref 3–12)
Neutro Abs: 5.9 10*3/uL (ref 1.7–7.7)
Neutrophils Relative %: 55 % (ref 43–77)
Promyelocytes Absolute: 0 %
RDW: 20.4 % — ABNORMAL HIGH (ref 11.5–15.5)
WBC: 10.7 10*3/uL — ABNORMAL HIGH (ref 4.0–10.5)

## 2012-04-06 LAB — COMPREHENSIVE METABOLIC PANEL
BUN: 6 mg/dL (ref 6–23)
CO2: 23 mEq/L (ref 19–32)
Calcium: 9.5 mg/dL (ref 8.4–10.5)
Chloride: 99 mEq/L (ref 96–112)
Creatinine, Ser: 0.3 mg/dL — ABNORMAL LOW (ref 0.50–1.10)
GFR calc Af Amer: >90 mL/min (ref >90)
GFR calc non Af Amer: >90 mL/min (ref >90)
Glucose, Bld: 117 mg/dL — ABNORMAL HIGH (ref 70–99)
Total Bilirubin: 3.8 mg/dL — ABNORMAL HIGH (ref 0.3–1.2)

## 2012-04-06 LAB — HEMOGLOBINOPATHY EVALUATION
Hemoglobin Other: 0 %
Hgb A2 Quant: 3.2 % (ref 2.2–3.2)

## 2012-04-06 MED ORDER — ONDANSETRON HCL 4 MG PO TABS: 4.0000 mg | ORAL_TABLET | Freq: Four times a day (QID) | ORAL | Status: DC | PRN

## 2012-04-06 MED ORDER — OXYCODONE HCL 5 MG PO TABS: 5.0000 mg | ORAL_TABLET | ORAL | Status: DC | PRN

## 2012-04-06 NOTE — Plan of Care (Signed)
Problem: Discharge Progression Outcomes Goal: Discharge plan in place and appropriate Outcome: Completed/Met Date Met:  04/06/12 Pt. Going home. Dressing changes q Monday, Wednesday and Friday

## 2012-04-06 NOTE — Discharge Summary (Signed)
[name] MRN: [id number] DOB/AGE: 1971/03/28 41 y.o.  Admit date: 03/29/2012 Discharge date: 04/06/2012  Primary Care Physician:  No primary provider on file.   Discharge Diagnoses:   Patient Active Problem List  Diagnosis  . Sickle cell disease  . Sickle cell anemia with pain  . Elevated LFTs  . Abdominal  pain, other specified site  . Nausea and vomiting in adult  . Cholelithiasis  . Anemia  . Leukocytosis  . Hypokalemia  . Chronic wound of extremity  . Constipation  . Sickle cell hemolytic anemia  . Sinus bradycardia by electrocardiogram    DISCHARGE MEDICATION:   Medication List     As of 04/06/2012  7:55 AM    TAKE these medications         acetaminophen 325 MG tablet   Commonly known as: TYLENOL   Take 650 mg by mouth every 6 (six) hours as needed. For pain      folic acid 1 MG tablet   Commonly known as: FOLVITE   Take 1 mg by mouth daily.      ondansetron 4 MG tablet   Commonly known as: ZOFRAN   Take 1 tablet (4 mg total) by mouth every 6 (six) hours as needed for nausea.      oxyCODONE 5 MG immediate release tablet   Commonly known as: Oxy IR/ROXICODONE   Take 1 tablet (5 mg total) by mouth every 4 (four) hours as needed.          Consults:     SIGNIFICANT DIAGNOSTIC STUDIES:  Dg Chest 1 View  03/30/2012  *RADIOLOGY REPORT*  Clinical Data: Chest pain  CHEST - 1 VIEW  Comparison: 03/29/2012  Findings: The heart is moderately enlarged.  Vascular congestion has developed.  Lungs under aerated with bibasilar atelectasis versus mild edema.  No pneumothorax.  Small pleural effusions are suspected.  IMPRESSION: Small pleural effusions suspected with bibasilar atelectasis versus mild edema.   Original Report Authenticated By: Jolaine Click, M.D.    Dg Chest 2 View  03/29/2012  *RADIOLOGY REPORT*  Clinical Data: Fever and emesis.  History of sickle cell disease.  CHEST - 2 VIEW  Comparison: 07/08/2010  Findings: Heart size is mildly enlarged.   There is no pleural effusion identified.  The bilateral, upper lobe predominant interstitial prominence is likely reflects scarring.  No superimposed airspace consolidation.  Bony changes of sickle cell disease noted.  IMPRESSION:  1.  No acute cardiopulmonary abnormalities noted.   Original Report Authenticated By: [name], M.D.    Dg Tibia/fibula Right  03/30/2012  *RADIOLOGY REPORT*  Clinical Data: Ulcer  RIGHT TIBIA AND FIBULA - 2 VIEW  Comparison: None.  Findings: No acute fracture and no dislocation.  No destructive bone lesion.  IMPRESSION: No acute bony pathology.   Original Report Authenticated By: Jolaine Click, M.D.    Dg Ankle Complete Right  03/30/2012  *RADIOLOGY REPORT*  Clinical Data: Medial ankle ulcer  RIGHT ANKLE - COMPLETE 3+ VIEW  Comparison: None.  Findings: No acute fracture or dislocation is noted.  No gross soft tissue abnormality is seen.  IMPRESSION: No acute abnormality noted.   Original Report Authenticated By: Alcide Clever, M.D.    Nm Hepatobiliary Liver Func  04/04/2012  *RADIOLOGY REPORT*  Clinical Data: Right upper quadrant pain, abnormal LFTs  NUCLEAR MEDICINE HEPATOHBILIARY INCLUDE GB  Radiopharmaceutical:  5.5 mCi Tc46m Choletec IV  Comparison: Correlation with ultrasound abdomen dated 03/30/2012.  Findings: Filling of the gallbladder within 15 minutes.  Small bowel is visualized within 20 minutes.  This appearance confirms cystic duct patency.  Mildly delayed hepatic excretion of radiotracer by 60 minutes.  IMPRESSION: No evidence of acute cholecystitis.  Mildly delayed hepatic excretion, raising the possibility of hepatocellular dysfunction.   Original Report Authenticated By: Charline Bills, M.D.    US Abdomen Complete  03/30/2012  *RADIOLOGY REPORT*  Clinical Data:  Elevated LFTs, jaundice.  Nausea, vomiting.  COMPLETE ABDOMINAL ULTRASOUND  Comparison:  None.  Findings:  Gallbladder:  Multiple mobile gallstones within the gallbladder. No wall thickening.   Negative sonographic Murphy's.  Common bile duct:   Normal caliber, 3 mm.  Liver:  No focal lesion identified.  Within normal limits in parenchymal echogenicity.  IVC:  Appears normal.  Pancreas:  No focal abnormality seen.  Spleen:  Not definitively visualized.  Right Kidney:   Slightly increased echotexture.  13.8 cm.  No hydronephrosis or focal abnormality.  Left Kidney:  13.2 cm.  Slightly increased echotexture.  No hydronephrosis or focal abnormality.  Abdominal aorta:  No aneurysm identified.  IMPRESSION: Cholelithiasis.  No sonographic evidence of acute cholecystitis.   Original Report Authenticated By: Charlett Nose, M.D.    Dg Abd Portable 1v  03/31/2012  *RADIOLOGY REPORT*  Clinical Data: Nausea, vomiting, abdominal pain, sickle cell disease  PORTABLE ABDOMEN - 1 VIEW  Comparison: None.  Findings: Normal bowel gas pattern without obstruction or ileus. No abnormal calcifications or mass effect.  Pelvic calcifications consistent with venous phleboliths on the left.  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  IMPRESSION: Normal bowel gas pattern.   Original Report Authenticated By: Judie Petit. Shick, M.D.    Dg Foot Complete Right  03/30/2012  *RADIOLOGY REPORT*  Clinical Data: Ulcer at the malleolus  RIGHT FOOT COMPLETE - 3+ VIEW  Comparison: None.  Findings: Mild pes planus deformity.  No acute fracture and no dislocation.  No destructive bone lesion.  IMPRESSION: No acute bony pathology.   Original Report Authenticated By: Jolaine Click, M.D.         Recent Results (from the past 240 hour(s))  CULTURE, BLOOD (ROUTINE X 2)     Status: Normal   Collection Time   03/29/12 11:58 PM      Component Value Range Status Comment   Specimen Description BLOOD RIGHT FOREARM   Final    Special Requests BOTTLES DRAWN AEROBIC ONLY [facility]   Final    Culture  Setup Time 03/30/2012 09:17   Final    Culture NO GROWTH 5 DAYS   Final    Report Status 04/05/2012  FINAL   Final   CULTURE, BLOOD (ROUTINE X 2)     Status: Normal   Collection Time   03/30/12 12:05 AM      Component Value Range Status Comment   Specimen Description BLOOD LEFT FOREARM   Final    Special Requests BOTTLES DRAWN AEROBIC ONLY [facility]   Final    Culture  Setup Time 03/30/2012 09:16   Final    Culture NO GROWTH 5 DAYS   Final    Report Status 04/05/2012 FINAL   Final   URINE CULTURE     Status: Normal   Collection Time   03/30/12  9:51 PM      Component Value Range Status Comment   Specimen Description URINE, CLEAN CATCH   Final    Special Requests NONE   Final    Culture  Setup Time 03/31/2012 03:06  Final    Colony Count NO GROWTH   Final    Culture NO GROWTH   Final    Report Status 04/01/2012 FINAL   Final     BRIEF ADMITTING H & P: 41 yo female h/o sickle cell disease from [place] comes in with 4 days of n/v nonbloody with epi abd pain which is not typical of her sickle cell crisis pain. She normally has back pain with joint pain in her upper ext and not abd pain. No brbpr no diarrhea. No fevers. No dysuria. No rashes. Has not been hosp for over a year and a half. Still has gallbladder. Vomit is bilious. Her baseline hgb is between 7-8. It is down to 6.4. She is normally not jaundiced except during acute crisis.   Hospital Course:  Present on Admission:  . Sickle cell disease: Pt  has recently moved to the area from [place]. She states that her sickle cell anemia has been managed at [facility] in [place]. She states that her normal hemoglobin usually resides around 7. She presented with a diagnosis of Sickle Cell disease-Genotype unknown. Hemoglobin electrophoresis pending at present. She will need to follow up with Dr. August Saucer in the clinic to establish care in the out patient setting.  . Vaso-occlusive pain Crisis: Pt states that her pain is usually located in the wrists and joints of the LE's. However most of her pain was located in the RUQ. The patient had a  consultation by surgery and a HIDA scan which was not consistent with acute cholecystitis. She was initially treated with dilaudid by PCA and then transitioned to oral analgesics. She has not required any analgesics in the last 24 hours. Pt is being discharged home on Oxycodone 5 mg q 4 hrs PRN.  Marland Kitchen Elevated LFTs: Likely related to Cholelithiasis  . Abdominal  pain, other specified site: resolved at time of discharge  . Nausea and vomiting in adult: Related to VOC  . Cholelithiasis: Pt to follow up with Dr. Magnus Ivan of General Surgery for a visit in 2 weeks for consideration of elective cholecystectomy.  . Chronic wound: Pt was seen by WOC who recommended local wound care with silver hydrofiber (Aquacel Ag+) changed M-W-F. However if wound continues to be nonhealing this patient may be a candidate for Pheresis or serial exchange transfusions in the future    Disposition and Follow-up:  Pt to follow up with Dr. DeaN IN 1 Week and with Dr. Magnus Ivan in 2 weeks.     Discharge Orders    Future Orders Please Complete By Expires   Diet general      Activity as tolerated - No restrictions      Discharge wound care:      Comments:   Apply square of Aquacel to wound M-W-F.      DISCHARGE EXAM:  General: Alert, awake, oriented x3, in no acute distress.  Vital Signs: BP 119/68, HR 65, T 98 F (36.7 C), temperature source Oral, RR 16, height 5\' 7"  (1.702 m), weight 58.9 kg (129 lb 13.6 oz), last menstrual period 03/22/2012, SpO2 98.00%. Heart: Regular rate and rhythm, without murmurs, rubs, gallops.  Lungs: Clear to auscultation.  Abdomen: Soft, non-tender,no guarding or rebound. Abdomen nondistended, positive bowel sounds, no masses no hepatosplenomegaly noted.  Neuro: Cranial nerves 2-12 grossly intact. Patient able to ambulate around the room without difficulty  Musculoskeletal: No warm swelling or erythema around joints, no spinal tenderness noted.  Skin: And has a pea-sized  wound on the  distal right lower extremity which has hydrated no chronic tissue in the base of the wound. There is no erythema or warmth around the wound.     Basename 04/06/12 0414 04/05/12 0412  NA 135 138  K 3.6 3.3*  CL 99 103  CO2 23 24  GLUCOSE 117* 105*  BUN 6 3*  CREATININE 0.30* 0.30*  CALCIUM 9.5 9.3  MG -- --  PHOS -- --    Basename 04/06/12 0414 04/05/12 0412  AST 78* 68*  ALT 71* 62*  ALKPHOS 89 78  BILITOT 3.8* 5.4*  PROT 7.4 6.9  ALBUMIN 3.7 3.4*   No results found for this basename: LIPASE:2,AMYLASE:2 in the last 72 hours  Basename 04/06/12 0414 04/05/12 0412  WBC 10.7* 8.1  NEUTROABS 5.9 4.1  HGB 10.5* 9.7*  HCT 31.0* 27.8*  MCV 85.4 85.0  PLT 271 280   Total time for discharge including decision making and face to face time was greater than 30 minutes.  Signed: [name],[name] 04/06/2012, 7:55 AM

## 2012-04-06 NOTE — Progress Notes (Signed)
Discuss discharge instructions with pt. Prescriptions given to pt. IV dc'd. Explained how to change her dressing to her RT inner ankle and supplies given to her that she will need to change the dressing for the next 3 weeks. Pt ambulating without any problems. Husband in to take pt. Home.

## 2012-10-06 ENCOUNTER — Encounter (HOSPITAL_COMMUNITY): Payer: Self-pay | Admitting: Emergency Medicine

## 2012-10-06 ENCOUNTER — Emergency Department (HOSPITAL_COMMUNITY)
Admission: EM | Admit: 2012-10-06 | Discharge: 2012-10-06 | Disposition: A | Discharge status: Home / Self Care | Payer: Medicaid - Out of State | Attending: Emergency Medicine | Admitting: Emergency Medicine

## 2012-10-06 DIAGNOSIS — Z79899 Other long term (current) drug therapy: Secondary | ICD-10-CM | POA: Insufficient documentation

## 2012-10-06 DIAGNOSIS — S91001D Unspecified open wound, right ankle, subsequent encounter: Secondary | ICD-10-CM

## 2012-10-06 DIAGNOSIS — D571 Sickle-cell disease 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: Secondary | ICD-10-CM

## 2012-10-06 DIAGNOSIS — D649 Anemia, unspecified: Secondary | ICD-10-CM

## 2012-10-06 DIAGNOSIS — R21 Rash and other nonspecific skin eruption: Secondary | ICD-10-CM

## 2012-10-06 DIAGNOSIS — Z4801 Encounter for change or removal of surgical wound dressing: Secondary | ICD-10-CM | POA: Insufficient documentation

## 2012-10-06 LAB — COMPREHENSIVE METABOLIC PANEL
ALT: 21 U/L (ref 0–35)
AST: 40 U/L — ABNORMAL HIGH (ref 0–37)
Albumin: 4.6 g/dL (ref 3.5–5.2)
Alkaline Phosphatase: 47 U/L (ref 39–117)
Calcium: 9.7 mg/dL (ref 8.4–10.5)
GFR calc Af Amer: >90 mL/min (ref >90)
Potassium: 3.8 mEq/L (ref 3.5–5.1)
Sodium: 139 mEq/L (ref 135–145)
Total Protein: 8.2 g/dL (ref 6.0–8.3)

## 2012-10-06 LAB — CBC WITH DIFFERENTIAL/PLATELET
Basophils Absolute: 0.1 10*3/uL (ref 0.0–0.1)
Eosinophils Absolute: 0.9 10*3/uL — ABNORMAL HIGH (ref 0.0–0.7)
HCT: 19.2 % — ABNORMAL LOW (ref 36.0–46.0)
Lymphocytes Relative: 22 % (ref 12–46)
MCHC: 36.5 g/dL — ABNORMAL HIGH (ref 30.0–36.0)
Monocytes Relative: 11 % (ref 3–12)
Neutro Abs: 7.3 10*3/uL (ref 1.7–7.7)
Neutrophils Relative %: 59 % (ref 43–77)
Platelets: 530 10*3/uL — ABNORMAL HIGH (ref 150–400)
RDW: 23.6 % — ABNORMAL HIGH (ref 11.5–15.5)
WBC: 12.5 10*3/uL — ABNORMAL HIGH (ref 4.0–10.5)

## 2012-10-06 MED ORDER — IBUPROFEN 800 MG PO TABS
800.0000 mg | ORAL_TABLET | Freq: Once | ORAL | Status: AC
  Administered 2012-10-06: 800 mg via ORAL
  Filled 2012-10-06: qty 1

## 2012-10-06 MED ORDER — SULFAMETHOXAZOLE-TRIMETHOPRIM 800-160 MG PO TABS: 1.0000 | ORAL_TABLET | Freq: Two times a day (BID) | ORAL | Status: DC

## 2012-10-06 MED ORDER — KETOCONAZOLE 2 % EX CREA: TOPICAL_CREAM | Freq: Every day | CUTANEOUS | Status: DC

## 2012-10-06 MED ORDER — PREDNISONE 20 MG PO TABS: ORAL_TABLET | ORAL | Status: DC

## 2012-10-06 NOTE — ED Provider Notes (Signed)
Pt relates she has had a itching rash on her medial ankle for about 1 year and the lateral malleolus the past two months. She reports a lot of itching. She denies fever or chills. She also states she started having a pruritic rash on her trunk and extremities the past couple of days.   Patient is thin alert and cooperative. She is noted to have scattered papular rash on her trauma and scattered lesions on her extremities. They are not actively draining. They are not on a red base. No rash on her palms or soles. She's also noted to have a circular lesion over her right medial malleolus that is hypopigmented in the center and surrounded by hyperpigmented ring. There is no active drainage. Her lateral malleolus has a cluster of hyperpigmented scaly rash in a ring in shape. The dorsum of her right foot is hyperpigmented compared to the left. Her right foot however is warm to touch compared to the left. Her palms and soles are noted to be very pale consistent with her anemia.  Medical screening examination/treatment/procedure(s) were conducted as a shared visit with non-physician practitioner(s) and myself.  I personally evaluated the patient during the encounter  Devoria Albe, MD, Franz Dell, MD 10/06/12 [phone]

## 2012-10-06 NOTE — ED Notes (Signed)
Pt alert and mentating appropriately. Pt itching body and legs repetitively. Pt c/o pain at wound site on right ankle. Pt denies n/v/d. Pt denies fevers. Pt denies numbness and tingling. Pt speaking in clear sentences. NAD noted at this time.

## 2012-10-06 NOTE — ED Provider Notes (Signed)
History     CSN: [id number]  Arrival date & time 10/06/12  1208   First MD Initiated Contact with Patient 10/06/12 1328      Chief Complaint  Patient presents with  . Rash  . Wound Check    (Consider location/radiation/quality/duration/timing/severity/associated sxs/prior treatment) HPI Comments: [name] is a 42 y/o F with PMHX of anemia and sickle cell disease presenting to the ED with wound complaints to the right ankle. Patient stated that she had mild itching to the right ankle region, with time evolved into a wound that has scabbed over. Patient stated that the medial aspect of her right ankle has been present for the past year, but stated that the scab on the lateral aspect of the right ankle has been present for the past 2 months. Patient stated that she has a constant burning sensation to the scabs and there is increase in pain with touch and motion. Patient stated that she went to the clinic a couple of months ago and used topical antibiotics which aided mildly, but stated that the pruritis and the pain returned within a couple of weeks. Denied drainage, injury, trauma, swelling, inflammation. Patient reported having generalized pruritis to entire body that started a week ago. Patient stated that this occurred sporadically - stated that when she scratches her skin little bumps develop. Patient stated that she "itches everywhere," and that the itching is constant. Patient stated that she has not used anything to alleviate the pruritis. Stated that no one else in the household has similar symptoms. Patient reported that hot baths aid in reducing the itching, but shortly after the itching continues. Patient stated that pruritis is worse at night. Denied fever, chills, sweating, abdominal symptoms, urinary symptoms, chest pain, shortness of breathe, difficulty breathing, visual complaints, headaches.   The history is provided by the patient. No language interpreter was used.    Past  Medical History  Diagnosis Date  . Sickle cell disease   . Anemia 03/30/2012    Hx of sickle cell disease    Past Surgical History  Procedure Laterality Date  . Cesarean section      History reviewed. No pertinent family history.  History  Substance Use Topics  . Smoking status: Never Smoker   . Smokeless tobacco: Never Used  . Alcohol Use: No    OB History   Grav Para Term Preterm Abortions TAB SAB Ect Mult Living                  Review of Systems  Constitutional: Negative for fever, chills and fatigue.  HENT: Negative for ear pain, congestion, sore throat, rhinorrhea, trouble swallowing, neck pain and neck stiffness.   Eyes: Negative for photophobia, pain and visual disturbance.  Respiratory: Negative for cough, chest tightness and shortness of breath.   Cardiovascular: Negative for chest pain.  Gastrointestinal: Negative for nausea, vomiting, abdominal pain, diarrhea, constipation, blood in stool and anal bleeding.  Genitourinary: Negative for dysuria, urgency, hematuria, flank pain, decreased urine volume and difficulty urinating.  Musculoskeletal: Negative for back pain and arthralgias.  Skin: Positive for rash and wound.  Neurological: Negative for dizziness, weakness, light-headedness, numbness and headaches.  All other systems reviewed and are negative.    Allergies  Review of patient's allergies indicates no known allergies.  Home Medications   Current Outpatient Rx  Name  Route  Sig  Dispense  Refill  . acetaminophen (TYLENOL) 500 MG tablet   Oral   Take 1,000 mg by mouth  every 6 (six) hours as needed for pain.         . folic acid (FOLVITE) 1 MG tablet   Oral   Take 1 mg by mouth daily.         Marland Kitchen neomycin-bacitracin-polymyxin (NEOSPORIN) [phone] ointment   Topical   Apply 1 application topically daily as needed (For rash.).         Marland Kitchen oxyCODONE (OXY IR/ROXICODONE) 5 MG immediate release tablet   Oral   Take 5 mg by mouth every 4  (four) hours as needed for pain.         Marland Kitchen ketoconazole (NIZORAL) 2 % cream   Topical   Apply topically daily. Please apply to lesions on the right ankle on both sides two times a day.   75 g   0   . predniSONE (DELTASONE) 20 MG tablet      Take 1 tablet PO TID x 5 days   15 tablet   0   . sulfamethoxazole-trimethoprim (BACTRIM DS,SEPTRA DS) 800-160 MG per tablet   Oral   Take 1 tablet by mouth 2 (two) times daily. One po bid x 7 days   14 tablet   0     BP 134/68  Pulse 81  Temp(Src) 98.9 F (37.2 C) (Oral)  Resp 18  SpO2 92%  Physical Exam  Nursing note and vitals reviewed. Constitutional: She is oriented to person, place, and time. She appears well-developed and well-nourished. No distress.  HENT:  Head: Normocephalic and atraumatic.  Mouth/Throat: Oropharynx is clear and moist. No oropharyngeal exudate.  Eyes: EOM are normal. Pupils are equal, round, and reactive to light. Scleral icterus (bilaterally) is present.  Neck: Normal range of motion. Neck supple. No tracheal deviation present. No thyromegaly present.  Negative nuchal rigidity Negative neck stiffness Negative lymphadenopathy  Cardiovascular: Normal rate, regular rhythm and normal heart sounds.   Radial pulses 2+ bilaterally Negative leg and ankle swelling  Pulmonary/Chest: Effort normal and breath sounds normal. No respiratory distress. She has no wheezes. She has no rales.  Musculoskeletal: Normal range of motion. She exhibits no edema.       Feet:  Lymphadenopathy:    She has no cervical adenopathy.  Neurological: She is alert and oriented to person, place, and time. No cranial nerve deficit or sensory deficit. She exhibits normal muscle tone. Coordination normal. GCS eye subscore is 4. GCS verbal subscore is 5. GCS motor subscore is 6.  Cranial nerves III-XII grossly intact Steady gait and balance  Skin: Skin is warm and dry. Rash noted. She is not diaphoretic.  Scattered papules located to  central chest wall, face, arms bilaterally, and hands bilaterally - mainly localized to the face and chest.  Negative erythema, inflammation, swelling, drainage  Healed over scab to the lateral and medial malleolus of the right ankle. Scabbed over lesion with mild yellow discoloration to the site. Pain upon palpation. Full ROM to the right ankle.   Negative rash noted to the palms of hands and soles of feet. Mild paleness noted secondary to anemia  Psychiatric: She has a normal mood and affect. Her behavior is normal. Thought content normal.    ED Course  Procedures (including critical care time)  5:00PM Discussed lab findings with patient. Patient stated that it is normal for her to have Hgb of 7.0 - stated that she normally has Hgb around 7.0-8.0. Patient stated that her last transfusion was November 2013.   Labs Reviewed  CBC WITH DIFFERENTIAL -  Abnormal; Notable for the following:    WBC 12.5 (*)    RBC 2.40 (*)    Hemoglobin 7.0 (*)    HCT 19.2 (*)    MCHC 36.5 (*)    RDW 23.6 (*)    Platelets 530 (*)    Eosinophils Relative 7 (*)    Monocytes Absolute 1.4 (*)    Eosinophils Absolute 0.9 (*)    All other components within normal limits  COMPREHENSIVE METABOLIC PANEL - Abnormal; Notable for the following:    BUN 5 (*)    Creatinine, Ser 0.33 (*)    AST 40 (*)    Total Bilirubin 4.5 (*)    All other components within normal limits  BILIRUBIN, DIRECT - Abnormal; Notable for the following:    Bilirubin, Direct 0.4 (*)    All other components within normal limits   No results found.   1. Rash   2. Wound of ankle, right, subsequent encounter   3. Sickle cell anemia   4. Anemia       MDM  Patient afebrile, non-tachycardic, non-tachypneic, normotensive, alert and oriented. Mild decreased saturation on room air - patient placed on pulse oximetry monitoring  DDx: scabies Elevated bilirubin  CBC - elevated WBC (12.5), Hgb decreased (7.0) - baseline as per patient ;  decreased Hct (19.2) ; smear reviewed with large platelets noted.  CMP - BUN low (5.0), Creatinine low (0.33), elevated AST (40), elevated total bilirubin (4.5) Elevated direct bilirubin (0.4)  Discussed case and findings with Dr. Lars Mage - reported that the elevated bilirubin due to hemolysis since patient has Sickle Cell Anemia. Dr. Lars Mage to see patient. Dr. Lynelle Doctor saw patient and recommended topical steroids, antibiotics, and wound care follow-up. Dr. Lars Mage cleared patient for discharge.  Patient aseptic, non-toxic appearing, in no acute distress. No respiratory distress noted. Patient discharged. Suspected possible fungal infection of the right ankle, papular rash noted to body with unknown etiology. Discharged patient with steroids, antibiotics, topical antifungal cream - discussed course and application with patient. Referred patient to Wound Center, Dermatologist, and Sickle Cell Clinic. Discussed with patient to stay hydrated and rest. Discussed with patient to have Hgb and Hct re-checked monthly - should be checked monthly due to anemia and sickle cell history. Discussed with patient to monitor symptoms and if symptoms are to worsen or change to report back to the ED. Resource guide given.  Patient agreed to plan of care, understood, all questions answered.         Raymon Mutton, PA-C 10/06/12 2122

## 2012-10-06 NOTE — ED Notes (Signed)
Pt here for generalized rash with itching and chronic painful wound to right lower leg; pt sts hx of sickle cell

## 2012-10-06 NOTE — ED Notes (Signed)
Pt ambulatory to bathroom without any problems 

## 2012-10-07 NOTE — ED Provider Notes (Signed)
See prior note   Ignatz Deis L Ric Rosenberg, MD 10/07/12 1054 

## 2012-10-09 DIAGNOSIS — K5900 Constipation, unspecified: Secondary | ICD-10-CM | POA: Diagnosis present

## 2012-10-09 DIAGNOSIS — R000 Tachycardia, unspecified: Secondary | ICD-10-CM | POA: Diagnosis present

## 2012-10-09 DIAGNOSIS — R112 Nausea with vomiting, unspecified: Secondary | ICD-10-CM | POA: Diagnosis present

## 2012-10-09 DIAGNOSIS — R0902 Hypoxemia: Secondary | ICD-10-CM | POA: Diagnosis present

## 2012-10-09 DIAGNOSIS — D5700 Hb-SS disease with crisis, unspecified: Principal | ICD-10-CM | POA: Diagnosis present

## 2012-10-09 DIAGNOSIS — J189 Pneumonia, unspecified organism: Secondary | ICD-10-CM | POA: Diagnosis present

## 2012-10-09 DIAGNOSIS — D5701 Hb-SS disease with acute chest syndrome: Secondary | ICD-10-CM | POA: Diagnosis present

## 2012-10-09 DIAGNOSIS — Z79899 Other long term (current) drug therapy: Secondary | ICD-10-CM

## 2012-10-09 DIAGNOSIS — G894 Chronic pain syndrome: Secondary | ICD-10-CM | POA: Diagnosis present

## 2012-10-09 DIAGNOSIS — I959 Hypotension, unspecified: Secondary | ICD-10-CM | POA: Diagnosis present

## 2012-10-09 DIAGNOSIS — K8020 Calculus of gallbladder without cholecystitis without obstruction: Secondary | ICD-10-CM | POA: Diagnosis present

## 2012-10-09 DIAGNOSIS — L97909 Non-pressure chronic ulcer of unspecified part of unspecified lower leg with unspecified severity: Secondary | ICD-10-CM | POA: Diagnosis present

## 2012-10-09 DIAGNOSIS — D72829 Elevated white blood cell count, unspecified: Secondary | ICD-10-CM | POA: Diagnosis present

## 2012-10-10 ENCOUNTER — Inpatient Hospital Stay (HOSPITAL_COMMUNITY): Payer: Medicaid Other

## 2012-10-10 ENCOUNTER — Encounter (HOSPITAL_COMMUNITY): Payer: Self-pay | Admitting: Emergency Medicine

## 2012-10-10 ENCOUNTER — Inpatient Hospital Stay (HOSPITAL_COMMUNITY)
Admission: EM | Admit: 2012-10-10 | Discharge: 2012-10-20 | DRG: 811 | Disposition: A | Discharge status: Home / Self Care | Payer: Medicaid Other | Attending: Internal Medicine | Admitting: Internal Medicine

## 2012-10-10 ENCOUNTER — Emergency Department (HOSPITAL_COMMUNITY): Payer: Medicaid Other

## 2012-10-10 DIAGNOSIS — IMO0002 Reserved for concepts with insufficient information to code with codable children: Secondary | ICD-10-CM

## 2012-10-10 DIAGNOSIS — R6510 Systemic inflammatory response syndrome (SIRS) of non-infectious origin without acute organ dysfunction: Secondary | ICD-10-CM

## 2012-10-10 DIAGNOSIS — D72829 Elevated white blood cell count, unspecified: Secondary | ICD-10-CM | POA: Diagnosis present

## 2012-10-10 DIAGNOSIS — S81809A Unspecified open wound, unspecified lower leg, initial encounter: Secondary | ICD-10-CM

## 2012-10-10 DIAGNOSIS — J189 Pneumonia, unspecified organism: Secondary | ICD-10-CM | POA: Diagnosis present

## 2012-10-10 DIAGNOSIS — K5900 Constipation, unspecified: Secondary | ICD-10-CM

## 2012-10-10 DIAGNOSIS — D571 Sickle-cell disease 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: Secondary | ICD-10-CM | POA: Diagnosis present

## 2012-10-10 DIAGNOSIS — G894 Chronic pain syndrome: Secondary | ICD-10-CM

## 2012-10-10 DIAGNOSIS — K8020 Calculus of gallbladder without cholecystitis without obstruction: Secondary | ICD-10-CM

## 2012-10-10 DIAGNOSIS — D649 Anemia, unspecified: Secondary | ICD-10-CM

## 2012-10-10 DIAGNOSIS — D5701 Hb-SS disease with acute chest syndrome: Secondary | ICD-10-CM

## 2012-10-10 DIAGNOSIS — R079 Chest pain, unspecified: Secondary | ICD-10-CM

## 2012-10-10 DIAGNOSIS — D5700 Hb-SS disease with crisis, unspecified: Secondary | ICD-10-CM

## 2012-10-10 LAB — URINALYSIS, ROUTINE W REFLEX MICROSCOPIC
Glucose, UA: NEGATIVE mg/dL
Ketones, ur: NEGATIVE mg/dL
Leukocytes, UA: NEGATIVE
Nitrite: NEGATIVE
Protein, ur: NEGATIVE mg/dL
Urobilinogen, UA: 1 mg/dL (ref 0.0–1.0)

## 2012-10-10 LAB — CBC WITH DIFFERENTIAL/PLATELET
Band Neutrophils: 0 % (ref 0–10)
Basophils Absolute: 0 10*3/uL (ref 0.0–0.1)
Basophils Relative: 0 % (ref 0–1)
Eosinophils Absolute: 1.3 10*3/uL — ABNORMAL HIGH (ref 0.0–0.7)
Eosinophils Relative: 5 % (ref 0–5)
HCT: 19.3 % — ABNORMAL LOW (ref 36.0–46.0)
Hemoglobin: 7.1 g/dL — ABNORMAL LOW (ref 12.0–15.0)
Lymphocytes Relative: 10 % — ABNORMAL LOW (ref 12–46)
Lymphs Abs: 2.6 10*3/uL (ref 0.7–4.0)
MCH: 30 pg (ref 26.0–34.0)
MCHC: 36.8 g/dL — ABNORMAL HIGH (ref 30.0–36.0)
Myelocytes: 0 %
Neutro Abs: 18 10*3/uL — ABNORMAL HIGH (ref 1.7–7.7)
Neutrophils Relative %: 70 % (ref 43–77)
Promyelocytes Absolute: 0 %
RBC: 2.37 MIL/uL — ABNORMAL LOW (ref 3.87–5.11)

## 2012-10-10 LAB — RETICULOCYTES
RBC.: 2.37 MIL/uL — ABNORMAL LOW (ref 3.87–5.11)
Retic Count, Absolute: 421.9 10*3/uL — ABNORMAL HIGH (ref 19.0–186.0)
Retic Ct Pct: 17.8 % — ABNORMAL HIGH (ref 0.4–3.1)

## 2012-10-10 LAB — CBC
Hemoglobin: 5.7 g/dL — CL (ref 12.0–15.0)
MCHC: 37 g/dL — ABNORMAL HIGH (ref 30.0–36.0)
Platelets: 392 10*3/uL (ref 150–400)
RDW: 20.7 % — ABNORMAL HIGH (ref 11.5–15.5)

## 2012-10-10 LAB — COMPREHENSIVE METABOLIC PANEL
CO2: 23 mEq/L (ref 19–32)
Calcium: 10.1 mg/dL (ref 8.4–10.5)
Creatinine, Ser: 0.55 mg/dL (ref 0.50–1.10)
GFR calc Af Amer: >90 mL/min (ref >90)
GFR calc non Af Amer: >90 mL/min (ref >90)
Glucose, Bld: 135 mg/dL — ABNORMAL HIGH (ref 70–99)
Sodium: 137 mEq/L (ref 135–145)
Total Protein: 9.1 g/dL — ABNORMAL HIGH (ref 6.0–8.3)

## 2012-10-10 LAB — CREATININE, SERUM
Creatinine, Ser: 0.34 mg/dL — ABNORMAL LOW (ref 0.50–1.10)
GFR calc non Af Amer: >90 mL/min (ref >90)

## 2012-10-10 LAB — PREGNANCY, URINE: Preg Test, Ur: NEGATIVE

## 2012-10-10 MED ORDER — ONDANSETRON HCL 4 MG PO TABS: 4.0000 mg | ORAL_TABLET | Freq: Four times a day (QID) | ORAL | Status: DC | PRN

## 2012-10-10 MED ORDER — HYDROMORPHONE HCL PF 1 MG/ML IJ SOLN
1.0000 mg | Freq: Once | INTRAMUSCULAR | Status: AC
  Administered 2012-10-10: 1 mg via INTRAVENOUS
  Filled 2012-10-10: qty 1

## 2012-10-10 MED ORDER — KETOROLAC TROMETHAMINE 30 MG/ML IJ SOLN
30.0000 mg | Freq: Four times a day (QID) | INTRAMUSCULAR | Status: AC
  Administered 2012-10-10 – 2012-10-15 (×19): 30 mg via INTRAVENOUS
  Filled 2012-10-10 (×24): qty 1

## 2012-10-10 MED ORDER — HYDROMORPHONE HCL PF 2 MG/ML IJ SOLN
2.0000 mg | INTRAMUSCULAR | Status: DC
  Administered 2012-10-10 (×5): 2 mg via INTRAVENOUS
  Filled 2012-10-10 (×5): qty 1

## 2012-10-10 MED ORDER — HYDROMORPHONE HCL PF 2 MG/ML IJ SOLN: 4.0000 mg | INTRAMUSCULAR | Status: DC

## 2012-10-10 MED ORDER — SODIUM CHLORIDE 0.9 % IV BOLUS (SEPSIS)
1000.0000 mL | Freq: Once | INTRAVENOUS | Status: AC
  Administered 2012-10-10: 1000 mL via INTRAVENOUS

## 2012-10-10 MED ORDER — FOLIC ACID 1 MG PO TABS
1.0000 mg | ORAL_TABLET | Freq: Every day | ORAL | Status: DC
  Administered 2012-10-10 – 2012-10-20 (×11): 1 mg via ORAL
  Filled 2012-10-10 (×11): qty 1

## 2012-10-10 MED ORDER — DIPHENHYDRAMINE HCL 25 MG PO CAPS
25.0000 mg | ORAL_CAPSULE | ORAL | Status: DC | PRN
  Administered 2012-10-10 – 2012-10-20 (×15): 25 mg via ORAL
  Filled 2012-10-10 (×15): qty 1

## 2012-10-10 MED ORDER — ONDANSETRON HCL 4 MG/2ML IJ SOLN
4.0000 mg | Freq: Once | INTRAMUSCULAR | Status: AC
  Administered 2012-10-10: 4 mg via INTRAVENOUS
  Filled 2012-10-10: qty 2

## 2012-10-10 MED ORDER — PROMETHAZINE HCL 25 MG/ML IJ SOLN: 12.5000 mg | Freq: Four times a day (QID) | INTRAMUSCULAR | Status: DC | PRN

## 2012-10-10 MED ORDER — ACETAMINOPHEN 650 MG RE SUPP: 650.0000 mg | Freq: Four times a day (QID) | RECTAL | Status: DC | PRN

## 2012-10-10 MED ORDER — KETOROLAC TROMETHAMINE 30 MG/ML IJ SOLN
30.0000 mg | Freq: Once | INTRAMUSCULAR | Status: AC
  Administered 2012-10-10: 30 mg via INTRAVENOUS
  Filled 2012-10-10: qty 1

## 2012-10-10 MED ORDER — SODIUM CHLORIDE 0.45 % IV SOLN
INTRAVENOUS | Status: AC
  Administered 2012-10-10: 08:00:00 via INTRAVENOUS
  Administered 2012-10-10: 100 mL/h via INTRAVENOUS

## 2012-10-10 MED ORDER — ENOXAPARIN SODIUM 40 MG/0.4ML ~~LOC~~ SOLN
40.0000 mg | SUBCUTANEOUS | Status: DC
  Administered 2012-10-10 – 2012-10-20 (×11): 40 mg via SUBCUTANEOUS
  Filled 2012-10-10 (×11): qty 0.4

## 2012-10-10 MED ORDER — HYDROMORPHONE HCL PF 2 MG/ML IJ SOLN
2.0000 mg | INTRAMUSCULAR | Status: DC
  Administered 2012-10-10 – 2012-10-11 (×10): 2 mg via INTRAVENOUS
  Filled 2012-10-10 (×10): qty 1

## 2012-10-10 MED ORDER — ACETAMINOPHEN 325 MG PO TABS
650.0000 mg | ORAL_TABLET | Freq: Four times a day (QID) | ORAL | Status: DC | PRN
  Administered 2012-10-16 – 2012-10-19 (×2): 650 mg via ORAL
  Filled 2012-10-10 (×2): qty 2

## 2012-10-10 MED ORDER — FENTANYL CITRATE 0.05 MG/ML IJ SOLN
INTRAMUSCULAR | Status: AC
  Administered 2012-10-10: 50 ug via INTRAVENOUS
  Filled 2012-10-10: qty 2

## 2012-10-10 MED ORDER — SODIUM CHLORIDE 0.9 % IJ SOLN
3.0000 mL | Freq: Two times a day (BID) | INTRAMUSCULAR | Status: DC
  Administered 2012-10-15 – 2012-10-20 (×5): 3 mL via INTRAVENOUS

## 2012-10-10 MED ORDER — ONDANSETRON HCL 4 MG/2ML IJ SOLN
4.0000 mg | Freq: Four times a day (QID) | INTRAMUSCULAR | Status: DC | PRN
  Administered 2012-10-12 (×2): 4 mg via INTRAVENOUS
  Filled 2012-10-10 (×2): qty 2

## 2012-10-10 MED ORDER — FENTANYL CITRATE 0.05 MG/ML IJ SOLN: 50.0000 ug | Freq: Once | INTRAMUSCULAR | Status: AC

## 2012-10-10 MED ORDER — HYDROMORPHONE HCL PF 2 MG/ML IJ SOLN
2.0000 mg | Freq: Once | INTRAMUSCULAR | Status: AC
  Administered 2012-10-10: 2 mg via INTRAVENOUS
  Filled 2012-10-10: qty 1

## 2012-10-10 NOTE — ED Notes (Signed)
PT. REPORTS SICKLE CELL PAIN CRISIS ONSET THIS MORNING UNRELIEVED BY PRESCRIPTION OXYCODONE , GENERALIZED BODY ACHES/PAIN ( BACK /LEGS / TORSO ) .

## 2012-10-10 NOTE — ED Notes (Signed)
Patient tearful and c/o pain in chest, back and bilateral legs d/t sickle cell crisis. Requesting pain medications. Informed EDP

## 2012-10-10 NOTE — Progress Notes (Signed)
Called MD to clarify orders entered at 1935, concerning blood transfusion order. NO additional transfusions for tonight will check the labs in the am. Tama Gander, RN, BSN

## 2012-10-10 NOTE — ED Notes (Signed)
[facility] notified to transport patient to [facility] long inpatient room 1434. Nursing report given to Huntley Dec, RN at [facility].

## 2012-10-10 NOTE — ED Notes (Signed)
Patient is sleeping and resting comfortably.

## 2012-10-10 NOTE — H&P (Addendum)
[facility] History and Physical  Tonja Jezewski ZOX:[id number] DOB: 11/02/1970 DOA: 10/10/2012  Referring physician: ER physician. PCP: No PCP Per Patient   Chief Complaint: Generalized body ache.  HPI: [name] is a 42 y.o. female with known history of sickle cell disease started experiencing generalized body ache since morning yesterday. Denies any associated fever chills headache focal deficits shortness of breath productive cough. In the ER patient was given multiple dose of pain medications despite which patient still has pain and has been admitted for sickle cell pain crisis. Patient's hemoglobin is around 7 and patient states it's usually stays around the same number. She does not follow with any PCP for here. Patient's lab show significant leukocytosis but patient is afebrile chest x-ray done remarkable patient denies any nausea vomiting or diarrhea. Patient does have history of cholelithiasis but at this time patient abdomen appears benign. Patient had come to the ER few days ago for right lower extremity wound. Patient has chronic wound of the right lower extremity on the medial aspect and has started developing on the lateral aspect now. Patient was prescribed prednisone and antibiotics and antifungal cream and was instructed to follow as outpatient. The wound does not have any active discharge at this time.  Review of Systems: As presented in the history of presenting illness, rest negative.  Past Medical History  Diagnosis Date  . Sickle cell disease   . Anemia 03/30/2012    Hx of sickle cell disease   Past Surgical History  Procedure Laterality Date  . Cesarean section     Social History:  reports that she has never smoked. She has never used smokeless tobacco. She reports that she does not drink alcohol or use illicit drugs. Lives at home. where does patient live-- Can do ADLs. Can patient participate in ADLs?  No Known Allergies  Family History  Problem  Relation Age of Onset  . Sickle cell anemia Sister       Prior to Admission medications   Medication Sig Start Date End Date Taking? Authorizing Provider  acetaminophen (TYLENOL) 500 MG tablet Take 1,000 mg by mouth every 6 (six) hours as needed for pain.   Yes Historical Provider, MD  folic acid (FOLVITE) 1 MG tablet Take 1 mg by mouth daily.   Yes Historical Provider, MD  ketoconazole (NIZORAL) 2 % cream Apply 1 application topically 2 (two) times daily.   Yes Historical Provider, MD  neomycin-bacitracin-polymyxin (NEOSPORIN) [phone] ointment Apply 1 application topically daily as needed (For rash.).   Yes Historical Provider, MD  oxyCODONE (OXY IR/ROXICODONE) 5 MG immediate release tablet Take 5 mg by mouth every 4 (four) hours as needed for pain. 04/06/12  Yes Altha Harm, MD  predniSONE (DELTASONE) 20 MG tablet Take 20 mg by mouth daily.   Yes Historical Provider, MD  sulfamethoxazole-trimethoprim (BACTRIM DS) 800-160 MG per tablet Take 1 tablet by mouth 2 (two) times daily.   Yes Historical Provider, MD   Physical Exam: Filed Vitals:   10/10/12 0430 10/10/12 0500 10/10/12 0530 10/10/12 0600  BP: 121/59 118/64 119/64 137/67  Pulse: 58 54 72 51  Temp:      TempSrc:      Resp:      SpO2: 100% 100% 93% 99%     General:  Well-developed well-nourished.  Eyes: Anicteric no pallor.  ENT: No discharge from the ears eyes nose mouth.  Neck: No mass felt.  Cardiovascular: S1-S2 heard.  Respiratory: No rhonchi or crepitations.  Abdomen: Soft  nontender bowel sounds present.  Skin: Skin changes in the right ankle both medial and lateral. No active discharge.  Musculoskeletal: No edema.  Psychiatric: Appears normal.  Neurologic: Alert awake oriented to time place and person. Moves all extremities.  Labs on Admission:  Basic Metabolic Panel:  Recent Labs Lab 10/06/12 1441 10/10/12 0008  NA 139 137  K 3.8 4.1  CL 102 98  CO2 22 23  GLUCOSE 84 135*  BUN 5* 9   CREATININE 0.33* 0.55  CALCIUM 9.7 10.1   Liver Function Tests:  Recent Labs Lab 10/06/12 1441 10/10/12 0008  AST 40* 42*  ALT 21 23  ALKPHOS 47 53  BILITOT 4.5* 4.7*  PROT 8.2 9.1*  ALBUMIN 4.6 4.9   No results found for this basename: LIPASE, AMYLASE,  in the last 168 hours No results found for this basename: AMMONIA,  in the last 168 hours CBC:  Recent Labs Lab 10/06/12 1441 10/10/12 0008  WBC 12.5* 25.8*  NEUTROABS 7.3 18.0*  HGB 7.0* 7.1*  HCT 19.2* 19.3*  MCV 80.0 81.4  PLT 530* 395   Cardiac Enzymes: No results found for this basename: CKTOTAL, CKMB, CKMBINDEX, TROPONINI,  in the last 168 hours  BNP (last 3 results) No results found for this basename: PROBNP,  in the last 8760 hours CBG: No results found for this basename: GLUCAP,  in the last 168 hours  Radiological Exams on Admission: Dg Chest 2 View  10/10/2012   *RADIOLOGY REPORT*  Clinical Data: Chest pain; history of sickle cell disease.  CHEST - 2 VIEW  Comparison: Chest radiograph from 03/30/2012  Findings: The lungs are well-aerated and clear.  There is no evidence of focal opacification, pleural effusion or pneumothorax. Pulmonary vascularity is at the upper limits of normal.  The heart is borderline enlarged.  No acute osseous abnormalities are seen.  IMPRESSION: Borderline cardiomegaly; no acute cardiopulmonary process seen.   Original Report Authenticated By: Tonia Ghent, M.D.     Assessment/Plan Principal Problem:   Sickle cell pain crisis Active Problems:   Sickle cell disease   Cholelithiasis   Leukocytosis   Wound of lower extremity   1. Sickle cell pain crisis - patient has been placed on scheduled dose of Dilaudid. Continue with gentle hydration. Patient will be transferred to [facility] long hospital to sickle cell team under Dr. Ashley Royalty. Since patient has known history of bradycardia and is receiving narcotics IV we will monitor in telemetry. 2. Leukocytosis - patient is afebrile.  Blood cultures have been sent. Presently no antibiotics have been started. 3. Chronic lower extremity wound - presently does not have any active discharge. Wound team consult. 4. Gallstones - patient's abdomen appears benign denies any nausea vomiting. Check sonogram of the abdomen. 5. Sickle cell anemia -  Follow CBC.    Code Status: Full code.  Family Communication: None.  Disposition Plan: Admit to inpatient.    KAKRAKANDY,ARSHAD N. [facility] Pager [phone].  If 7PM-7AM, please contact night-coverage [email or web address] Password [facility] 10/10/2012, 6:25 AM       Patient admitted this am by dr Toniann Fail. Seen and examined. Will assume care of patient under the sickle cell inpatient service.   Lonia Blood

## 2012-10-10 NOTE — Progress Notes (Signed)
CRITICAL VALUE ALERT  Critical value received:  HBG 5.7  Date of notification:  5/24  Time of notification:  0950  Critical value read back:yes  Nurse who received alert:  [place] RN  MD notified (1st page):  Rama  Time of first page:  [phone]  MD notified (2nd page):    Time of second page:    Responding MD:      Time MD responded:

## 2012-10-10 NOTE — ED Notes (Signed)
Patient transported to X-ray 

## 2012-10-10 NOTE — ED Notes (Signed)
MD at bedside. 

## 2012-10-10 NOTE — ED Provider Notes (Signed)
History     CSN: [id number]  Arrival date & time 10/09/12  2357   First MD Initiated Contact with Patient 10/10/12 [phone]      Chief Complaint  Patient presents with  . Sickle Cell Pain Crisis    (Consider location/radiation/quality/duration/timing/severity/associated sxs/prior treatment) HPI Patient is a 42 yo woman with Hgb SS who presents with complaints of sickle cell crisis. She developed chest pain, bilateral buttock and leg pain approx 24 hrs ago. She has been taking oxycodone with out relief. She describes her pain as aching, nonradiating and 10/10. Feels similar to previous SS crises.    Patient denies fever, cough, SOB. She endorses a history of acute chest syndrome - most recently in 2012. Says that these symptoms do not feel like previous episode of acute chest syndrome. Patient does not have a local hematologist.     Past Medical History  Diagnosis Date  . Sickle cell disease   . Anemia 03/30/2012    Hx of sickle cell disease    Past Surgical History  Procedure Laterality Date  . Cesarean section      No family history on file.  History  Substance Use Topics  . Smoking status: Never Smoker   . Smokeless tobacco: Never Used  . Alcohol Use: No    OB History   Grav Para Term Preterm Abortions TAB SAB Ect Mult Living                  Review of Systems Gen: no weight loss, fevers, chills, night sweats Eyes: no discharge or drainage, no occular pain or visual changes Nose: no epistaxis or rhinorrhea Mouth: no dental pain, no sore throat Neck: no neck pain Lungs: no SOB, cough, wheezing CV: no chest pain, palpitations, dependent edema or orthopnea Abd: no abdominal pain, nausea, vomiting GU: no dysuria or gross hematuria MSK: as per history of present illness, otherwise negative Neuro: no headache, no focal neurologic deficits Skin: no rash Psyche: negative.  Allergies  Review of patient's allergies indicates no known allergies.  Home Medications    Current Outpatient Rx  Name  Route  Sig  Dispense  Refill  . acetaminophen (TYLENOL) 500 MG tablet   Oral   Take 1,000 mg by mouth every 6 (six) hours as needed for pain.         . folic acid (FOLVITE) 1 MG tablet   Oral   Take 1 mg by mouth daily.         Marland Kitchen ketoconazole (NIZORAL) 2 % cream   Topical   Apply 1 application topically 2 (two) times daily.         Marland Kitchen neomycin-bacitracin-polymyxin (NEOSPORIN) [phone] ointment   Topical   Apply 1 application topically daily as needed (For rash.).         Marland Kitchen oxyCODONE (OXY IR/ROXICODONE) 5 MG immediate release tablet   Oral   Take 5 mg by mouth every 4 (four) hours as needed for pain.         . predniSONE (DELTASONE) 20 MG tablet   Oral   Take 20 mg by mouth daily.         Marland Kitchen sulfamethoxazole-trimethoprim (BACTRIM DS) 800-160 MG per tablet   Oral   Take 1 tablet by mouth 2 (two) times daily.           BP 123/72  Pulse 60  Temp(Src) 99 F (37.2 C) (Oral)  Resp 16  SpO2 92%  LMP 10/01/2012  Physical Exam Gen:  well developed and well nourished appearing, appears uncomfortable, tearful. Head: NCAT Eyes: PERL, EOMI, icteric sclera Nose: no epistaixis or rhinorrhea Mouth/throat: mucosa is moist and pink Neck: supple, no stridor Lungs: CTA B, no wheezing, rhonchi or rales CV: RRR, no murmur, extremities appear well perfused Abd: soft, notender, nondistended Back: no ttp, no cva ttp Skin: no rashese, wnl MSK: legs are ttp diffusely, no edema, no deformity. Neuro: CN ii-xii grossly intact, no focal deficits Psyche; mildly anxious affect,  calm and cooperative.   ED Course  Procedures (including critical care time)  Results for orders placed during the hospital encounter of 10/10/12 (from the past 24 hour(s))  CBC WITH DIFFERENTIAL     Status: Abnormal   Collection Time    10/10/12 12:08 AM      Result Value Range   WBC 25.8 (*) 4.0 - 10.5 K/uL   RBC 2.37 (*) 3.87 - 5.11 MIL/uL   Hemoglobin 7.1 (*)  12.0 - 15.0 g/dL   HCT 16.1 (*) 09.6 - 04.5 %   MCV 81.4  78.0 - 100.0 fL   MCH 30.0  26.0 - 34.0 pg   MCHC 36.8 (*) 30.0 - 36.0 g/dL   RDW 40.9 (*) 81.1 - 91.4 %   Platelets 395  150 - 400 K/uL   Neutrophils Relative % 70  43 - 77 %   Lymphocytes Relative 10 (*) 12 - 46 %   Monocytes Relative 15 (*) 3 - 12 %   Eosinophils Relative 5  0 - 5 %   Basophils Relative 0  0 - 1 %   Band Neutrophils 0  0 - 10 %   Metamyelocytes Relative 0     Myelocytes 0     Promyelocytes Absolute 0     Blasts 0     nRBC 13 (*) 0 /100 WBC   Neutro Abs 18.0 (*) 1.7 - 7.7 K/uL   Lymphs Abs 2.6  0.7 - 4.0 K/uL   Monocytes Absolute 3.9 (*) 0.1 - 1.0 K/uL   Eosinophils Absolute 1.3 (*) 0.0 - 0.7 K/uL   Basophils Absolute 0.0  0.0 - 0.1 K/uL   RBC Morphology TARGET CELLS     WBC Morphology MILD LEFT SHIFT (1-5% METAS, OCC MYELO, OCC BANDS)     Smear Review LARGE PLATELETS PRESENT    COMPREHENSIVE METABOLIC PANEL     Status: Abnormal   Collection Time    10/10/12 12:08 AM      Result Value Range   Sodium 137  135 - 145 mEq/L   Potassium 4.1  3.5 - 5.1 mEq/L   Chloride 98  96 - 112 mEq/L   CO2 23  19 - 32 mEq/L   Glucose, Bld 135 (*) 70 - 99 mg/dL   BUN 9  6 - 23 mg/dL   Creatinine, Ser 7.82  0.50 - 1.10 mg/dL   Calcium 95.6  8.4 - 21.3 mg/dL   Total Protein 9.1 (*) 6.0 - 8.3 g/dL   Albumin 4.9  3.5 - 5.2 g/dL   AST 42 (*) 0 - 37 U/L   ALT 23  0 - 35 U/L   Alkaline Phosphatase 53  39 - 117 U/L   Total Bilirubin 4.7 (*) 0.3 - 1.2 mg/dL   GFR calc non Af Amer >90  >90 mL/min   GFR calc Af Amer >90  >90 mL/min  RETICULOCYTES     Status: Abnormal   Collection Time    10/10/12 12:08 AM  Result Value Range   Retic Ct Pct 17.8 (*) 0.4 - 3.1 %   RBC. 2.37 (*) 3.87 - 5.11 MIL/uL   Retic Count, Manual 421.9 (*) 19.0 - 186.0 K/uL   CXR: normal cardiac silloute, normal appearing mediastinum, no infiltrates, no acute process identified.   EKG: nsr, no acute ischemic changes, normal intervals, normal  axis, normal qrs complex    MDM  Patient with significant pain despite multiple rounds of Dilaudid in the ED. CXR wnl.  Remains afebrile with sats of 98 to 100% on RA. Do not suspect acute chest syndrome at this point.  Case discussed with Dr. Toniann Fail with request for admission for further monitoring and management of sickle cell crisis.  (late entry).         Brandt Loosen, MD 10/11/12 [phone]

## 2012-10-10 NOTE — ED Notes (Signed)
Patient called out requesting pain medication. EDP notified.

## 2012-10-11 LAB — BASIC METABOLIC PANEL
BUN: 7 mg/dL (ref 6–23)
Creatinine, Ser: 0.39 mg/dL — ABNORMAL LOW (ref 0.50–1.10)
GFR calc Af Amer: >90 mL/min (ref >90)
GFR calc non Af Amer: >90 mL/min (ref >90)
Glucose, Bld: 98 mg/dL (ref 70–99)

## 2012-10-11 LAB — CBC
HCT: 18.7 % — ABNORMAL LOW (ref 36.0–46.0)
MCH: 29.1 pg (ref 26.0–34.0)
MCHC: 35.8 g/dL (ref 30.0–36.0)
MCV: 81.3 fL (ref 78.0–100.0)
RDW: 19.6 % — ABNORMAL HIGH (ref 11.5–15.5)
WBC: 28.1 10*3/uL — ABNORMAL HIGH (ref 4.0–10.5)

## 2012-10-11 MED ORDER — HYDROMORPHONE HCL PF 2 MG/ML IJ SOLN
4.0000 mg | INTRAMUSCULAR | Status: DC
  Administered 2012-10-11 – 2012-10-13 (×17): 4 mg via INTRAVENOUS
  Filled 2012-10-11 (×17): qty 2

## 2012-10-11 MED ORDER — OXYCODONE HCL ER 10 MG PO T12A
10.0000 mg | EXTENDED_RELEASE_TABLET | Freq: Two times a day (BID) | ORAL | Status: DC
  Administered 2012-10-11 – 2012-10-13 (×4): 10 mg via ORAL
  Filled 2012-10-11 (×5): qty 1

## 2012-10-11 MED ORDER — OXYCODONE HCL 5 MG PO TABS: 5.0000 mg | ORAL_TABLET | ORAL | Status: DC | PRN

## 2012-10-11 NOTE — Progress Notes (Signed)
Subjective: A 42 yo woman with known sickle cell disease here with sickle cell painful crisis. Patient complained of 9/10 pain. She was on only IV Dilaudid 2 mg every 2 hours. She has also not been on any oral medications. She received one unit of packed red blood cell yesterday and her hemoglobin has gone up to 6.7. She usually takes oxycodone at home but has no insurance and has not been able to afford her medications. She hasn't had an acute sickle cell crisis in a long time. No chest pain, no shortness of breath, no nausea vomiting or diarrhea.  Objective: Vital signs in last 24 hours: Temp:  [97.4 F (36.3 C)-98.6 F (37 C)] 98.6 F (37 C) (05/25 0643) Pulse Rate:  [50-64] 63 (05/25 0643) Resp:  [16-18] 18 (05/25 0643) BP: (121-153)/(64-79) 121/67 mmHg (05/25 0643) SpO2:  [100 %] 100 % (05/25 0643) Weight change:  Last BM Date: 10/09/12  Intake/Output from previous day: 05/24 0701 - 05/25 0700 In: 2675 [I.V.:2350; Blood:325] Out: -  Intake/Output this shift:    General appearance: alert, cooperative and mild distress Eyes: positive findings: sclera icteric, PERRL Throat: lips, mucosa, and tongue normal; teeth and gums normal Resp: clear to auscultation bilaterally Chest wall: right sided chest wall tenderness, left sided chest wall tenderness Cardio: regular rate and rhythm, S1, S2 normal, no murmur, click, rub or gallop GI: soft, non-tender; bowel sounds normal; no masses,  no organomegaly Extremities: extremities normal, atraumatic, no cyanosis or edema Pulses: 2+ and symmetric Skin: Skin color, texture, turgor normal. No rashes or lesions Neurologic: Grossly normal  Lab Results:  Recent Labs  10/10/12 0935 10/11/12 0540  WBC 27.7* PENDING  HGB 5.7* 6.7*  HCT 15.4* 18.7*  PLT 392 PENDING   BMET  Recent Labs  10/10/12 0008 10/10/12 0935 10/11/12 0540  NA 137  --  137  K 4.1  --  4.1  CL 98  --  102  CO2 23  --  25  GLUCOSE 135*  --  98  BUN 9  --  7   CREATININE 0.55 0.34* 0.39*  CALCIUM 10.1  --  8.8    Studies/Results: Dg Chest 2 View  10/10/2012   *RADIOLOGY REPORT*  Clinical Data: Chest pain; history of sickle cell disease.  CHEST - 2 VIEW  Comparison: Chest radiograph from 03/30/2012  Findings: The lungs are well-aerated and clear.  There is no evidence of focal opacification, pleural effusion or pneumothorax. Pulmonary vascularity is at the upper limits of normal.  The heart is borderline enlarged.  No acute osseous abnormalities are seen.  IMPRESSION: Borderline cardiomegaly; no acute cardiopulmonary process seen.   Original Report Authenticated By: Tonia Ghent, M.D.   US Abdomen Complete  10/10/2012   *RADIOLOGY REPORT*  Clinical Data:  Cholelithiasis  COMPLETE ABDOMINAL ULTRASOUND  Comparison:  03/30/2012  Findings:  Gallbladder:  Multiple small gallstones.  Gallbladder wall thickening or pericholecystic fluid.  Negative sonographic Murphy's sign.  Common bile duct:  Measures 3 mm.  Liver:  No focal lesion identified.  Within normal limits in parenchymal echogenicity.  IVC:  Appears normal.  Pancreas:  Incompletely visualized but grossly unremarkable.  Spleen:  Measures 5.1 cm.  Poorly visualized and mildly heterogeneous, likely related to history of sickle cell disease.  Right Kidney:  Measures 15.5 cm.  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  No hydronephrosis.  Left Kidney:  Measures 13.0 cm.  No mass or hydronephrosis.  Abdominal aorta:  No aneurysm identified.  IMPRESSION: Cholelithiasis, without associated findings to suggest acute cholecystitis.  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   Original Report Authenticated By: Charline Bills, M.D.    Medications: I have reviewed the patient's current medications.  Assessment/Plan: A 42 year old female admitted with acute sickle cell painful crisis.  Also had hemolytic crisis.  #1 sickle cell painful crisis: Patient still having 9/10 pain. She seemed to be still in acute crisis phase. I will increase her IV Dilaudid to 4 mg every 2 hours, start oral oxycodone and OxyContin, was hemoglobin reaches 7 I will restart hydroxyurea.  #2 sickle cell hemolytic crisis: She is status post transfusion 1 pack red blood cell. Her hemoglobin is 6.7 today. Will recheck in the morning it is not rising or if his dropping out transfuse another unit of packed red blood cell. If she is still having crisis we may have to do exchange transfusion.  #3 cholelithiasis: Seems improving as patient is able to eat and drink at this point.  #4 lower extremity wound: Wound care will be instituted as needed.  #5 leukocytosis: Most likely secondary to VOC.  LOS: 1 day   GARBA,LAWAL 10/11/2012, 9:00 AM

## 2012-10-12 ENCOUNTER — Inpatient Hospital Stay (HOSPITAL_COMMUNITY): Payer: Medicaid Other

## 2012-10-12 LAB — CBC WITH DIFFERENTIAL/PLATELET
Eosinophils Absolute: 1.8 10*3/uL — ABNORMAL HIGH (ref 0.0–0.7)
Eosinophils Relative: 8 % — ABNORMAL HIGH (ref 0–5)
Lymphs Abs: 7.6 10*3/uL — ABNORMAL HIGH (ref 0.7–4.0)
MCH: 28.3 pg (ref 26.0–34.0)
MCHC: 34.2 g/dL (ref 30.0–36.0)
MCV: 82.8 fL (ref 78.0–100.0)
Monocytes Absolute: 2.8 10*3/uL — ABNORMAL HIGH (ref 0.1–1.0)
Platelets: 270 10*3/uL (ref 150–400)
RBC: 2.44 MIL/uL — ABNORMAL LOW (ref 3.87–5.11)

## 2012-10-12 MED ORDER — LEVOFLOXACIN IN D5W 750 MG/150ML IV SOLN
750.0000 mg | INTRAVENOUS | Status: DC
  Administered 2012-10-12 – 2012-10-13 (×2): 750 mg via INTRAVENOUS
  Filled 2012-10-12 (×3): qty 150

## 2012-10-12 MED ORDER — NALOXONE HCL 0.4 MG/ML IJ SOLN
INTRAMUSCULAR | Status: AC
  Filled 2012-10-12: qty 1

## 2012-10-12 MED ORDER — ENSURE COMPLETE PO LIQD
237.0000 mL | Freq: Two times a day (BID) | ORAL | Status: DC
  Administered 2012-10-12: 237 mL via ORAL

## 2012-10-12 NOTE — Progress Notes (Signed)
Subjective: A 42 yo with known sickle cell disease but new to our program here with sickle cell painful crisis. Her pain is better today on Dilaudid but has had tachycardia, hypotension as well as hypoxia. She has been feeling rather sick. No fever, no cough. Has been sleepy mostly.  Objective: Vital signs in last 24 hours: Temp:  [97.2 F (36.2 C)-98.5 F (36.9 C)] 98.5 F (36.9 C) (05/26 1446) Pulse Rate:  [75-118] 100 (05/26 1600) Resp:  [16-18] 16 (05/26 1600) BP: (109-141)/(64-74) 109/64 mmHg (05/26 1446) SpO2:  [99 %-100 %] 100 % (05/26 1600) Weight change:  Last BM Date: 10/09/12  Intake/Output from previous day: 05/25 0701 - 05/26 0700 In: 120 [P.O.:120] Out: -  Intake/Output this shift:    General appearance: alert, cooperative, appears stated age, distracted, fatigued and mild distress Eyes: conjunctivae/corneas clear. PERRL, EOM's intact. Fundi benign. Throat: lips, mucosa, and tongue normal; teeth and gums normal Resp: diminished breath sounds bilaterally Chest wall: right sided chest wall tenderness, left sided chest wall tenderness Cardio: regular rate and rhythm, S1, S2 normal, no murmur, click, rub or gallop GI: soft, non-tender; bowel sounds normal; no masses,  no organomegaly Extremities: extremities normal, atraumatic, no cyanosis or edema Pulses: 2+ and symmetric Skin: Skin color, texture, turgor normal. No rashes or lesions Neurologic: Grossly normal  Lab Results:  Recent Labs  10/11/12 0540 10/12/12 0447  WBC 28.1* 23.0*  HGB 6.7* 6.9*  HCT 18.7* 20.2*  PLT 335 270   BMET  Recent Labs  10/10/12 0008 10/10/12 0935 10/11/12 0540  NA 137  --  137  K 4.1  --  4.1  CL 98  --  102  CO2 23  --  25  GLUCOSE 135*  --  98  BUN 9  --  7  CREATININE 0.55 0.34* 0.39*  CALCIUM 10.1  --  8.8    Studies/Results: Dg Chest 1 View  10/12/2012   *RADIOLOGY REPORT*  Clinical Data: Shortness of breath, sickle cell anemia, upper back/chest pain  CHEST -  1 VIEW  Comparison: 10/10/2012  Findings: Increased interstitial markings, favored to reflect mild interstitial edema.  Small bilateral pleural effusions.  Associated lower lobe opacities, likely atelectasis, pneumonia not excluded.  Cardiomegaly.  IMPRESSION: Cardiomegaly with mild interstitial edema and small bilateral pleural effusions.  Patchy bilateral lower lobe opacities, likely atelectasis, pneumonia not excluded.   Original Report Authenticated By: Charline Bills, M.D.    Medications: I have reviewed the patient's current medications.  Assessment/Plan: A 42 year old female admitted with acute sickle cell painful crisis. Also had hemolytic crisis.  #1 sickle cell painful crisis:Current pain regimen seems to be working. We will continue together with oral oxycodone and OxyContin, when hemoglobin reaches 7 I will restart hydroxyurea. We will begin titration once she is improved.  #2 Hypoxia and tachypnea: worrisome for early acute chest syndrome. Patient is afebrile but has tachypnea and low oxygen saturation. Also has some mild rales. Differentials will include pneumonia and ACS. Will review CXR again. Bilateral LE infiltrates may represent PNA. Will keep on oxygen and begin empiric Levaquin.    #3 Sickle cell hemolytic crisis: She is status post transfusion 1 pack red blood cell. Her hemoglobin is 6.9 today. We may need to have exchange transfusion if no improvement in am.  #4 cholelithiasis: Improving as patient is able to eat and drink at this point. GI referral at DC  #4 lower extremity wound: Wound care consulted.  #5 leukocytosis: Most likely secondary to VOC.  Seems improving.  LOS: 2 days   GARBA,LAWAL 10/12/2012, 5:26 PM

## 2012-10-12 NOTE — Progress Notes (Signed)
INITIAL NUTRITION ASSESSMENT  DOCUMENTATION CODES Per approved criteria  -Not Applicable   INTERVENTION: - Ensure Complete BID - Encouraged increased PO intake - Unit RD to monitor   NUTRITION DIAGNOSIS: Unintended weight loss related to poor appetite/intake from sickle cell pain as evidenced by pt report.   Goal: 1. Pt to consume >90% of meals/supplements 2. Weight maintenance  Monitor:  Weights, labs, intake  Reason for Assessment: Nutrition risk   42 y.o. female  Admitting Dx: Sickle cell pain crisis  ASSESSMENT: Pt admitted with sickle cell pain crisis. Met with pt who reports poor appetite PTA r/t pain. Pt reports she thinks she has lost 2-3 pounds unintentionally in the past week. Pt has been consuming 50% of meals. Pt interested in getting Ensure for additional nutrition.   Height: Ht Readings from Last 1 Encounters:  10/10/12 5\' 7"  (1.702 m)    Weight: Wt Readings from Last 1 Encounters:  10/10/12 128 lb (58.06 kg)    Ideal Body Weight: 135 lb  % Ideal Body Weight: 95  Wt Readings from Last 10 Encounters:  10/10/12 128 lb (58.06 kg)  04/03/12 129 lb 13.6 oz (58.9 kg)    Usual Body Weight: 130-131 lb  % Usual Body Weight: 98  BMI:  Body mass index is 20.04 kg/(m^2).  Estimated Nutritional Needs: Kcal: 1450-1750 Protein: 60-70g Fluid: 1.4-1.7L/day  Skin: Excoriated right ankle  Diet Order: General  EDUCATION NEEDS: -No education needs identified at this time  No intake or output data in the 24 hours ending 10/12/12 1408  Last BM: 5/23  Labs:   Recent Labs Lab 10/06/12 1441 10/10/12 0008 10/10/12 0935 10/11/12 0540  NA 139 137  --  137  K 3.8 4.1  --  4.1  CL 102 98  --  102  CO2 22 23  --  25  BUN 5* 9  --  7  CREATININE 0.33* 0.55 0.34* 0.39*  CALCIUM 9.7 10.1  --  8.8  GLUCOSE 84 135*  --  98    CBG (last 3)  No results found for this basename: GLUCAP,  in the last 72 hours  Scheduled Meds: . enoxaparin (LOVENOX)  injection  40 mg Subcutaneous Q24H  . folic acid  1 mg Oral Daily  .  HYDROmorphone (DILAUDID) injection  4 mg Intravenous Q2H  . ketorolac  30 mg Intravenous Q6H  . naloxone      . OxyCODONE  10 mg Oral Q12H  . sodium chloride  3 mL Intravenous Q12H    Continuous Infusions:   Past Medical History  Diagnosis Date  . Sickle cell disease   . Anemia 03/30/2012    Hx of sickle cell disease    Past Surgical History  Procedure Laterality Date  . Cesarean section       Levon Hedger MS, RD, LDN [phone] Pager [phone] After Hours Pager

## 2012-10-12 NOTE — Consult Note (Signed)
WOC consult Note Reason for Consult:chronic LE ulcerations on right LE (medial and laterally).  Today, ulcers are dry and not draining. 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  Medially:  Dry ulceration measuring 5cm x 7cm.   Wound bed:dry. Drainage (amount, consistency, odor) None Periwound:intact, dry, no edema Dressing procedure/placement/frequency: Patient states she has had the medial ulceration for 1 year and that lateral ulcerations for many months.  She does not like the darkened skin appearance resulting from the healed ulcerations.  There is no edema nad no draining ulcers at this time.  I wll provide soft silicone foam dressings to the medial and lateral sites and provide 13-47mmHg constant (not graduated) compression TED hose.  Patient education provided to highlight that silicone dressings often minimize scarring and that TED hose will (perhaps) impact the onset of new ulcerations. NB:  Her ankle circumference is less than 7 and 1/2 inches so I am reluctant to apply a non-stretch compression bandage such as an Unna's boot today. I will not follow.  Please re-consult if needed. Thanks, Ladona Mow, MSN, RN, [facility], CWOCN [phone])

## 2012-10-12 NOTE — Progress Notes (Signed)
Extremely lethargic, pulse rate elevated 150-220, resp rate 12, 97/73 Dr, Mikeal Hawthorne notified and orders obtained.

## 2012-10-13 DIAGNOSIS — D5700 Hb-SS disease with crisis, unspecified: Principal | ICD-10-CM

## 2012-10-13 DIAGNOSIS — I369 Nonrheumatic tricuspid valve disorder, unspecified: Secondary | ICD-10-CM

## 2012-10-13 DIAGNOSIS — G894 Chronic pain syndrome: Secondary | ICD-10-CM

## 2012-10-13 DIAGNOSIS — J189 Pneumonia, unspecified organism: Secondary | ICD-10-CM

## 2012-10-13 DIAGNOSIS — K5900 Constipation, unspecified: Secondary | ICD-10-CM

## 2012-10-13 DIAGNOSIS — D5701 Hb-SS disease with acute chest syndrome: Secondary | ICD-10-CM

## 2012-10-13 LAB — COMPREHENSIVE METABOLIC PANEL
BUN: 5 mg/dL — ABNORMAL LOW (ref 6–23)
Calcium: 8.8 mg/dL (ref 8.4–10.5)
GFR calc Af Amer: >90 mL/min (ref >90)
Glucose, Bld: 113 mg/dL — ABNORMAL HIGH (ref 70–99)
Total Protein: 6.7 g/dL (ref 6.0–8.3)

## 2012-10-13 LAB — CBC WITH DIFFERENTIAL/PLATELET
Basophils Absolute: 0 10*3/uL (ref 0.0–0.1)
Eosinophils Absolute: 0.3 10*3/uL (ref 0.0–0.7)
HCT: 16.7 % — ABNORMAL LOW (ref 36.0–46.0)
Lymphocytes Relative: 8 % — ABNORMAL LOW (ref 12–46)
Lymphs Abs: 1.1 10*3/uL (ref 0.7–4.0)
MCHC: 35.9 g/dL (ref 30.0–36.0)
MCV: 81.5 fL (ref 78.0–100.0)
Neutro Abs: 10.9 10*3/uL — ABNORMAL HIGH (ref 1.7–7.7)
RDW: 20.8 % — ABNORMAL HIGH (ref 11.5–15.5)

## 2012-10-13 LAB — RETICULOCYTES
Retic Count, Absolute: 516.6 10*3/uL — ABNORMAL HIGH (ref 19.0–186.0)
Retic Ct Pct: 25.2 % — ABNORMAL HIGH (ref 0.4–3.1)

## 2012-10-13 LAB — PREPARE RBC (CROSSMATCH)

## 2012-10-13 MED ORDER — HYDROMORPHONE HCL PF 1 MG/ML IJ SOLN
0.5000 mg | INTRAMUSCULAR | Status: DC | PRN
  Administered 2012-10-13 – 2012-10-16 (×8): 1 mg via INTRAVENOUS
  Administered 2012-10-16: 0.5 mg via INTRAVENOUS
  Administered 2012-10-16 – 2012-10-17 (×5): 1 mg via INTRAVENOUS
  Filled 2012-10-13 (×14): qty 1

## 2012-10-13 MED ORDER — POLYETHYLENE GLYCOL 3350 17 G PO PACK
17.0000 g | PACK | Freq: Every day | ORAL | Status: DC
  Administered 2012-10-13 – 2012-10-20 (×4): 17 g via ORAL
  Filled 2012-10-13 (×8): qty 1

## 2012-10-13 MED ORDER — SENNOSIDES-DOCUSATE SODIUM 8.6-50 MG PO TABS
1.0000 | ORAL_TABLET | Freq: Two times a day (BID) | ORAL | Status: DC
  Administered 2012-10-13 – 2012-10-20 (×10): 1 via ORAL
  Filled 2012-10-13 (×15): qty 1

## 2012-10-13 MED ORDER — OXYCODONE HCL 5 MG PO TABS
5.0000 mg | ORAL_TABLET | ORAL | Status: DC
  Administered 2012-10-13 – 2012-10-20 (×42): 5 mg via ORAL
  Filled 2012-10-13 (×42): qty 1

## 2012-10-13 NOTE — Progress Notes (Signed)
CRITICAL VALUE ALERT  Critical value received:  Hgb 6.0  Date of notification:  10/13/2012   Time of notification:  0540 AM  Critical value read back:yes  Nurse who received alert:  Mann Skaggs, Estanislado Emms  RN  MD notified (1st page):  Craige Cotta NP  Time of first page:  0550 AM  MD notified (2nd page): Craige Cotta NP  Time of second page: 0600 AM  Responding MD:  Craige Cotta NP  Time MD responded:  [email or web address]

## 2012-10-13 NOTE — Progress Notes (Signed)
*  PRELIMINARY RESULTS* Echocardiogram 2D Echocardiogram has been performed.  [name] 10/13/2012, 9:27 AM

## 2012-10-13 NOTE — Progress Notes (Signed)
SICKLE CELL SERVICE PROGRESS NOTE  [name] ZOX:[id number] DOB: [birth year] DOA: 10/10/2012 PCP: No PCP Per Patient  Assessment/Plan: Principal Problem: Hb-Ss disease with acute chest syndrome Active Problems:   Sickle cell pain crisis   Sickle cell disease   Cholelithiasis   Leukocytosis   Chronic Wound of lower extremity   CAP (community acquired pneumonia)   Chronic pain syndrome  1. Hb SS with Acute Chest Syndrome: Etiology likely secondary to her current pneumonia. However patient is at risk for pulmonary embolus. If she does not improve with reasonable treatment of a pneumonia then we'll need to investigate to rule out pulmonary embolus. Continue current antibiotics. We'll also transfuse the patient one unit PRBC's to improve her oxygen-carrying capacity  2. Sickle cell pain with crisis: Patient is opiate nave and only uses Tylenol for pain management at home. A dose of 4 mg of the longer it is excessive in this patient. I will decrease her dose to 0.5-1 mg of Dilaudid every 2 hours as needed and continue her on scheduled oxycodone 5 mg every 4 hours. Also continue ketorolac for adjunctive therapy.  3. Leukocytosis: Patient's leukocytosis is likely secondary to the acute infection of the lungs as well as acute vaso-occlusive episode. It has decreased significantly and as suspected we'll continue with treatment of the pneumonia. I will continue to monitor the white blood cell count.  4. Anemia: Likely secondary to hemolysis. Transfuse one unit of packed red blood cells as the patient is symptomatic with elevated heart rates.  5. Community acquired pneumonia: Patient is on day #2 of Levaquin by IV route. The patient's been having some nausea and vomiting today thus we'll continue IV Levaquin until the patient is able to tolerate orals without difficulty and transition to oral Levaquin for a total course of therapy of 8 days.  6. Nausea and vomiting: Patient started having nausea  and vomiting yesterday. She still reports nausea however no active vomiting. We'll continue trial of diet. If unable to tolerate current diet will need to be escalate to clear liquids  7. Chronic leg ulcers: This is likely mediated by decreased oxygen-carrying capacity to the skin and they're to vaso-occlusive disease. The wounds do not appear to be infected at this time and patient has been seen by wound ostomy care was recommended presence of the wound. This chronic wound isn't indication for serial transfusions.  8. Chronic pain syndrome: Patient has some degree of chronic pain which she manages with Tylenol. The patient has no opiate medications or NSAIDs for management of her pain. In my interview with the patient's reveals that she takes approximately 6 g of Tylenol daily to control her pain. I advised the patient that this amount is too folded daily allowable dose of acetaminophen. I would recommend alternating with NSAIDs. The patient however has no prescription coverage and is unable to obtain a prescription NSAIDs or opiates. I will let social work see the patient to see what assistance can be offered in this regard.  9. Cholelithiasis: The patient has a history of cholelithiasis and her last admission had acute cholecystitis. She was supposed to followup with general surgery for consideration of removal of gallbladder, however she did not followup due to lack of insurance. The patient denies any ongoing symptoms and states that her nausea and vomiting started only one day ago.  10. 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 She has not been on any disease modifying medications such as  folic acid and hydroxyurea. The patient has been started on folic acid here in the hospital however hydroxyurea is on hold due to her low indices.  11. Psychosocial: The patient is originally from [place] in his hearing that he status in that he states. The  patient was married however states that she has been separated from her husband for the last 5 months. She has a 60-year-old child however has no medical care source and has no consistent means of income. I've consulted social work and case management to see what assistance can be offered to this patient for medical provision perspective.  Code Status: Full code Family Communication: Not applicable  Disposition Plan: Home at the time of discharge  Takao Lizer A.  Pager [phone]. If 7PM-7AM, please contact night-coverage.  10/13/2012, 10:11 AM  LOS: 3 days   Brief narrative: [name] is a 42 y.o. female with known history of sickle cell disease started experiencing generalized body ache since morning yesterday. Denies any associated fever chills headache focal deficits shortness of breath productive cough. In the ER patient was given multiple dose of pain medications despite which patient still has pain and has been admitted for sickle cell pain crisis. Patient's hemoglobin is around 7 and patient states it's usually stays around the same number. She does not follow with any PCP for here. Patient's lab show significant leukocytosis but patient is afebrile chest x-ray done remarkable patient denies any nausea vomiting or diarrhea. Patient does have history of cholelithiasis but at this time patient abdomen appears benign. Patient had come to the ER few days ago for right lower extremity wound. Patient has chronic wound of the right lower extremity on the medial aspect and has started developing on the lateral aspect now. Patient was prescribed prednisone and antibiotics and antifungal cream and was instructed to follow as outpatient. The wound does not have any active discharge at this time.   Consultants:  WOC- Catha Gosselin, RN  Procedures:  None  Antibiotics:  Levaquin 5/26 >>  HPI/Subjective: Patient reports pain presently in her upper back and chest area. She states that the  patient was initially also her legs however this has resolved. Her current pain as an intensity of 5-7/10. She describes the pain as aching, nonradiating. She does have nausea and vomiting which started yesterday. The patient has had no episode of vomiting today but does still continue to feel nauseous. She denies any abdominal pain or tenderness. Patient states that she's been unable to seek medical care due to the lack of provider coverage. Patient states that she normally has jaundice at her baseline.  Objective: Filed Vitals:   10/12/12 1446 10/12/12 1600 10/12/12 2135 10/13/12 0412  BP: 109/64  112/66 107/66  Pulse: 98 100 81 81  Temp: 98.5 F (36.9 C)  98 F (36.7 C) 98.7 F (37.1 C)  TempSrc: Oral  Oral Oral  Resp: 16 16 18 18   Height:      Weight:      SpO2: 100% 100% 100% 100%   Weight change:  No intake or output data in the 24 hours ending 10/13/12 1011  General: Alert, awake, oriented x3, in no acute distress. Patient appears chronically ill. HEENT: [place]/AT PEERL, EOMI, mild icterus Neck: Trachea midline,  no masses, no thyromegal,y no JVD, no carotid bruit OROPHARYNX:  Moist, No exudate/ erythema/lesions.  Heart: Regular rate and rhythm, mild tachycardia, without murmurs, rubs, gallops, PMI non-displaced, no heaves or thrills on palpation.  Lungs: Clear to auscultation, no  wheezing or rhonchi noted.  Abdomen: Soft, nontender, nondistended, positive bowel sounds, no masses.  Neuro: No focal neurological deficits noted cranial nerves II through XII grossly intact. DTRs 2+ bilaterally upper and lower extremities. Strength functional in bilateral upper and lower extremities. Musculoskeletal: No warm swelling or erythema around joints, no spinal tenderness noted. Psychiatric: Patient alert and oriented x3, good insight and cognition, good recent to remote recall. Appears depressed however does not want to complete the PHQ-9.   Data Reviewed: Basic Metabolic Panel:  Recent  Labs Lab 10/06/12 1441 10/10/12 0008 10/10/12 0935 10/11/12 0540 10/13/12 0520  NA 139 137  --  137 136  K 3.8 4.1  --  4.1 3.5  CL 102 98  --  102 97  CO2 22 23  --  25 35*  GLUCOSE 84 135*  --  98 113*  BUN 5* 9  --  7 5*  CREATININE 0.33* 0.55 0.34* 0.39* 0.33*  CALCIUM 9.7 10.1  --  8.8 8.8   Liver Function Tests:  Recent Labs Lab 10/06/12 1441 10/10/12 0008 10/13/12 0520  AST 40* 42* 30  ALT 21 23 21   ALKPHOS 47 53 93  BILITOT 4.5* 4.7* 3.4*  PROT 8.2 9.1* 6.7  ALBUMIN 4.6 4.9 3.2*   No results found for this basename: LIPASE, AMYLASE,  in the last 168 hours No results found for this basename: AMMONIA,  in the last 168 hours CBC:  Recent Labs Lab 10/06/12 1441 10/10/12 0008 10/10/12 0935 10/11/12 0540 10/12/12 0447 10/13/12 0520  WBC 12.5* 25.8* 27.7* 28.1* 23.0* 14.1*  NEUTROABS 7.3 18.0*  --   --  10.6* 10.9*  HGB 7.0* 7.1* 5.7* 6.7* 6.9* 6.0*  HCT 19.2* 19.3* 15.4* 18.7* 20.2* 16.7*  MCV 80.0 81.4 80.6 81.3 82.8 81.5  PLT 530* 395 392 335 270 287   Cardiac Enzymes: No results found for this basename: CKTOTAL, CKMB, CKMBINDEX, TROPONINI,  in the last 168 hours BNP (last 3 results) No results found for this basename: PROBNP,  in the last 8760 hours CBG: No results found for this basename: GLUCAP,  in the last 168 hours  No results found for this or any previous visit (from the past 240 hour(s)).   Studies: Dg Chest 1 View  10/12/2012   *RADIOLOGY REPORT*  Clinical Data: Shortness of breath, sickle cell anemia, upper back/chest pain  CHEST - 1 VIEW  Comparison: 10/10/2012  Findings: Increased interstitial markings, favored to reflect mild interstitial edema.  Small bilateral pleural effusions.  Associated lower lobe opacities, likely atelectasis, pneumonia not excluded.  Cardiomegaly.  IMPRESSION: Cardiomegaly with mild interstitial edema and small bilateral pleural effusions.  Patchy bilateral lower lobe opacities, likely atelectasis, pneumonia not  excluded.   Original Report Authenticated By: Charline Bills, M.D.   Dg Chest 2 View  10/10/2012   *RADIOLOGY REPORT*  Clinical Data: Chest pain; history of sickle cell disease.  CHEST - 2 VIEW  Comparison: Chest radiograph from 03/30/2012  Findings: The lungs are well-aerated and clear.  There is no evidence of focal opacification, pleural effusion or pneumothorax. Pulmonary vascularity is at the upper limits of normal.  The heart is borderline enlarged.  No acute osseous abnormalities are seen.  IMPRESSION: Borderline cardiomegaly; no acute cardiopulmonary process seen.   Original Report Authenticated By: Tonia Ghent, M.D.   US Abdomen Complete  10/10/2012   *RADIOLOGY REPORT*  Clinical Data:  Cholelithiasis  COMPLETE ABDOMINAL ULTRASOUND  Comparison:  03/30/2012  Findings:  Gallbladder:  Multiple small gallstones.  Gallbladder wall thickening or pericholecystic fluid.  Negative sonographic Murphy's sign.  Common bile duct:  Measures 3 mm.  Liver:  No focal lesion identified.  Within normal limits in parenchymal echogenicity.  IVC:  Appears normal.  Pancreas:  Incompletely visualized but grossly unremarkable.  Spleen:  Measures 5.1 cm.  Poorly visualized and mildly heterogeneous, likely related to history of sickle cell disease.  Right Kidney:  Measures 15.5 cm.  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  No hydronephrosis.  Left Kidney:  Measures 13.0 cm.  No mass or hydronephrosis.  Abdominal aorta:  No aneurysm identified.  IMPRESSION: Cholelithiasis, without associated findings to suggest acute cholecystitis.  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   Original Report Authenticated By: Charline Bills, M.D.    Scheduled Meds: . enoxaparin (LOVENOX) injection  40 mg Subcutaneous Q24H  . feeding supplement  237 mL Oral BID BM  . folic acid  1 mg Oral Daily  .   HYDROmorphone (DILAUDID) injection  4 mg Intravenous Q2H  . ketorolac  30 mg Intravenous Q6H  . levofloxacin (LEVAQUIN) IV  750 mg Intravenous Q24H  . OxyCODONE  10 mg Oral Q12H  . sodium chloride  3 mL Intravenous Q12H   Continuous Infusions:   Time Spent-50 minutes.

## 2012-10-14 LAB — COMPREHENSIVE METABOLIC PANEL
ALT: 20 U/L (ref 0–35)
AST: 28 U/L (ref 0–37)
Albumin: 3 g/dL — ABNORMAL LOW (ref 3.5–5.2)
Alkaline Phosphatase: 144 U/L — ABNORMAL HIGH (ref 39–117)
BUN: 5 mg/dL — ABNORMAL LOW (ref 6–23)
Chloride: 97 mEq/L (ref 96–112)
Potassium: 3.4 mEq/L — ABNORMAL LOW (ref 3.5–5.1)
Sodium: 136 mEq/L (ref 135–145)
Total Bilirubin: 4 mg/dL — ABNORMAL HIGH (ref 0.3–1.2)

## 2012-10-14 LAB — TYPE AND SCREEN
Unit division: 0
Unit division: 0

## 2012-10-14 LAB — CBC WITH DIFFERENTIAL/PLATELET
Band Neutrophils: 0 % (ref 0–10)
Blasts: 0 %
HCT: 20.4 % — ABNORMAL LOW (ref 36.0–46.0)
Lymphocytes Relative: 22 % (ref 12–46)
Lymphs Abs: 2.5 10*3/uL (ref 0.7–4.0)
MCHC: 34.3 g/dL (ref 30.0–36.0)
Neutrophils Relative %: 62 % (ref 43–77)
Promyelocytes Absolute: 0 %
RDW: 18.4 % — ABNORMAL HIGH (ref 11.5–15.5)

## 2012-10-14 LAB — MAGNESIUM: Magnesium: 2.1 mg/dL (ref 1.5–2.5)

## 2012-10-14 MED ORDER — LEVOFLOXACIN 750 MG PO TABS
750.0000 mg | ORAL_TABLET | ORAL | Status: DC
  Administered 2012-10-14 – 2012-10-15 (×2): 750 mg via ORAL
  Filled 2012-10-14 (×3): qty 1

## 2012-10-14 NOTE — Progress Notes (Signed)
PHARMACIST - PHYSICIAN COMMUNICATION CONCERNING: Antibiotic IV to Oral Route Change Policy  RECOMMENDATION: This patient is receiving Levaquin by the intravenous route.  Based on criteria approved by the Pharmacy and Therapeutics Committee, the antibiotic(s) is/are being converted to the equivalent oral dose form(s).   DESCRIPTION: These criteria include:  Patient being treated for a respiratory tract infection, urinary tract infection, or cellulitis  The patient is not neutropenic and does not exhibit a GI malabsorption state  The patient is eating (either orally or via tube) and/or has been taking other orally administered medications for a least 24 hours  The patient is improving clinically and has a Tmax < 100.5  If you have questions about this conversion, please contact the Pharmacy Department  []   [phone] )  [facility] []   [phone] )  [facility]  []   [phone] )  [facility] [x]   [phone] )  [facility]    Geoffry Paradise, PharmD, BCPS Pager: [phone] 1:05 PM Pharmacy #: [id number]

## 2012-10-14 NOTE — Progress Notes (Signed)
Subjective: A 42 yo with sickle cell disease and acute chest syndrome secondary to pneumonia. Patient is getting better today. She is still weak tired and having some shortness of breath. Her pain is currently a 7/10. She denied chest pain. Denied nausea vomiting or diarrhea. She is on IV Dilaudid and oral short acting oxycodone. Her pain is still not completely relieved but is not getting worse. Has social issues are compounding. She has no money, and has not had a chance to look for work. 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 Her illness is however making it difficult for her to do that and that makes her more depressed.  Objective: Vital signs in last 24 hours: Temp:  [98.2 F (36.8 C)-98.6 F (37 C)] 98.6 F (37 C) (05/28 1336) Pulse Rate:  [67-76] 67 (05/28 1336) Resp:  [18] 18 (05/28 1336) BP: (103-112)/(55-63) 107/58 mmHg (05/28 1336) SpO2:  [100 %] 100 % (05/28 1336) Weight change:  Last BM Date: 10/08/12  Intake/Output from previous day: 05/27 0701 - 05/28 0700 In: 532.5 [I.V.:50; Blood:332.5; IV Piggyback:150] Out: 1400 [Urine:1400] Intake/Output this shift: Total I/O In: 240 [P.O.:240] Out: 475 [Urine:475]  General appearance: alert, cooperative, appears stated age, distracted, fatigued and mild distress Eyes: conjunctivae/corneas clear. PERRL, EOM's intact. Fundi benign. Throat: lips, mucosa, and tongue normal; teeth and gums normal Resp: diminished breath sounds bilaterally Chest wall: right sided chest wall tenderness, left sided chest wall tenderness Cardio: regular rate and rhythm, S1, S2 normal, no murmur, click, rub or gallop GI: soft, non-tender; bowel sounds normal; no masses,  no organomegaly Extremities: extremities normal, atraumatic, no cyanosis or edema Pulses: 2+ and symmetric Skin: Skin color, texture, turgor normal. No rashes or lesions Neurologic: Grossly normal  Lab  Results:  Recent Labs  10/13/12 0520 10/14/12 0423  WBC 14.1* 11.5*  HGB 6.0* 7.0*  HCT 16.7* 20.4*  PLT 287 302   BMET  Recent Labs  10/13/12 0520 10/14/12 0423  NA 136 136  K 3.5 3.4*  CL 97 97  CO2 35* 33*  GLUCOSE 113* 96  BUN 5* 5*  CREATININE 0.33* 0.35*  CALCIUM 8.8 8.6    Studies/Results: No results found.  Medications: I have reviewed the patient's current medications.  Assessment/Plan: A 42 year old female admitted with acute sickle cell painful crisis. Also had hemolytic crisis and acute chest syndrome secondary to pneumonia.  #1 sickle cell painful crisis with acute chest syndrome: Patient is getting relieved with current regimen. Her pain is still 7/10 however since she is not getting any worse I will give her another day of the IV Dilaudid with her orals. Understanding that she was opiate nave before admission.  #2 Hypoxia and tachypnea: Most likely due to acute chest syndrome and bilateral lower lobe pneumonia. She is not tachypnea today. She still has oxygen on but her oxygen saturation is 100%. I therefore feel like we can titrate her off of oxygen in the next day or 2. Continue with the Levaquin for at least 7-10 days  #3 Sickle cell hemolytic crisis: Patient has received another transfusion of packed red blood cells yesterday. Her H&H is looking better today. We'll continue to monitor her H&H.  #4 cholelithiasis: Chronic and worked up in the past. No active symptoms at this point.  #4 lower extremity wound: Wound care consulted.  #5 leukocytosis: Most likely secondary to VOC. Seems improving.  #6 hypokalemia: Continue  to replete her potassium.  LOS: 4 days   GARBA,LAWAL 10/14/2012, 6:48 PM

## 2012-10-15 DIAGNOSIS — D649 Anemia, unspecified: Secondary | ICD-10-CM

## 2012-10-15 MED ORDER — SORBITOL 70 % SOLN
960.0000 mL | TOPICAL_OIL | Freq: Once | ORAL | Status: AC | PRN
  Administered 2012-10-15: 960 mL via RECTAL
  Filled 2012-10-15: qty 240

## 2012-10-15 MED ORDER — LEVOFLOXACIN 750 MG PO TABS: 750.0000 mg | ORAL_TABLET | ORAL | Status: DC

## 2012-10-15 MED ORDER — OXYCODONE HCL 5 MG PO TABS: 5.0000 mg | ORAL_TABLET | Freq: Four times a day (QID) | ORAL | Status: DC | PRN

## 2012-10-15 MED ORDER — SORBITOL 70 % SOLN
30.0000 mL | Freq: Once | Status: AC
  Administered 2012-10-15: 30 mL via ORAL
  Filled 2012-10-15: qty 30

## 2012-10-15 MED ORDER — FOLIC ACID 1 MG PO TABS: 1.0000 mg | ORAL_TABLET | Freq: Every day | ORAL | Status: DC

## 2012-10-15 NOTE — Progress Notes (Signed)
Enema given with return of small hard balls of stool.  Pt ambulated in hall with only mild dizziness briefly.  Berton Bon RN-BC

## 2012-10-15 NOTE — Care Management (Addendum)
Patient: [name] DOB :1970/12/17 MRN :[id number]  Date: 10/15/2012  Documentation Initiated by : Jefm Miles Bed Status: inpatient  Subjective/Objective Assessment: [name] is a 42 year old female with Hb SS/SCD. Currently [name] does not have a primary care provider. [name] states" she is new to the area. [name] is not familiar with [facility].   Barriers to care: Need PCP  Referral(s) made to: [facility]  Prior Approval(PA) #: NA                       PA start date: NA                   PA end date: NA  Action/Plan: This CM spoke with [name] regarding becoming a new patient to Dr. Ashley Royalty and the [facility]. This CM explained the services available if she becomes a new patient. This CM also explained the connection [facility] has with [facility] [employer] and provided a brochure regarding [facility] services. [name] agreed to this CM referring her to [facility]. This CM left a new patient package with [name] and she agreed to complete and this CM will pick it up from on tomorrow( [name] is not expected to be discharged to home today).    Comments: [name] did not have any further questions at this time.  Time spent on case management: 45 mins.  Karoline Caldwell, RN, BSN, [place] [phone]

## 2012-10-15 NOTE — Progress Notes (Signed)
Pt had further BM and states relief of abdominal pressure.  Berton Bon RN-BC

## 2012-10-15 NOTE — Progress Notes (Signed)
SICKLE CELL SERVICE PROGRESS NOTE  [name] JXB:[id number] DOB: 09/04/1970 DOA: 10/10/2012 PCP: No PCP Per Patient  Assessment/Plan: Principal Problem: 1. Hb SS with Acute Chest Syndrome: Etiology likely secondary to her current pneumonia. Oxygenation improved  2. Sickle cell pain with crisis: Patient is opiate nave and only uses Tylenol for pain management at home. A dose of 4 mg of the longer it is excessive in this patient.However her pain is still poorly controlled. I will increase her dose to 1.5-2 mg of Dilaudid every 2 hours as needed and continue her on scheduled oxycodone 5 mg every 4 hours. Also continue ketorolac for adjunctive therapy.  3. Leukocytosis: Improved. Patient's leukocytosis is likely secondary to the acute infection of the lungs as well as acute vaso-occlusive episode. It has decreased significantly and as suspected we'll continue with treatment of the pneumonia. I will continue to monitor the white blood cell count.  4. Anemia: Likely secondary to hemolysis. Transfuse one unit of packed red blood cells as the patient is symptomatic with elevated heart rates.  5. Community acquired pneumonia: Patient is on day #4 of Levaquin continue for 4 more days.  6. Nausea and vomiting:Resolved. Pt tolerating regular diet   7. Chronic leg ulcers: This is likely mediated by decreased oxygen-carrying capacity to the skin and they're to vaso-occlusive disease. The wounds do not appear to be infected at this time and patient has been seen by wound ostomy care was recommended presence of the wound. This chronic wound isn't indication for serial transfusions.  8. Chronic pain syndrome: Patient has some degree of chronic pain which she manages with Tylenol. The patient has no opiate medications or NSAIDs for management of her pain. In my interview with the patient's reveals that she takes approximately 6 g of Tylenol daily to control her pain. I advised the patient that this amount is  too folded daily allowable dose of acetaminophen. I would recommend alternating with NSAIDs. The patient however has no prescription coverage and is unable to obtain a prescription NSAIDs or opiates. I will let social work see the patient to see what assistance can be offered in this regard.  9. Cholelithiasis: The patient has a history of cholelithiasis and her last admission had acute cholecystitis. She was supposed to followup with general surgery for consideration of removal of gallbladder, however she did not followup due to lack of insurance. The patient denies any ongoing symptoms and states that her nausea and vomiting started only one day ago.  10. 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 She has not been on any disease modifying medications such as folic acid and hydroxyurea. The patient has been started on folic acid here in the hospital however hydroxyurea is on hold due to her low indices.  11. Psychosocial: The patient is originally from [place] in his hearing that he status in that he states. The patient was married however states that she has been separated from her husband for the last 5 months. She has a 48-year-old child however has no medical care source and has no consistent means of income. I've consulted social work and case management to see what assistance can be offered to this patient for medical provision perspective.  Code Status: Full code Family Communication: Not applicable  Disposition Plan: Home at the time of discharge  [name],[name]  Pager [phone]. If 7PM-7AM, please contact night-coverage.  10/15/2012, 5:18 PM  LOS: 5 days   Brief narrative: [name] [place]  [name] is a 42 y.o. female with known history of sickle cell disease started experiencing generalized body ache since morning yesterday. Denies any associated fever chills headache focal deficits shortness of breath productive cough. In the ER patient was  given multiple dose of pain medications despite which patient still has pain and has been admitted for sickle cell pain crisis. Patient's hemoglobin is around 7 and patient states it's usually stays around the same number. She does not follow with any PCP for here. Patient's lab show significant leukocytosis but patient is afebrile chest x-ray done remarkable patient denies any nausea vomiting or diarrhea. Patient does have history of cholelithiasis but at this time patient abdomen appears benign. Patient had come to the ER few days ago for right lower extremity wound. Patient has chronic wound of the right lower extremity on the medial aspect and has started developing on the lateral aspect now. Patient was prescribed prednisone and antibiotics and antifungal cream and was instructed to follow as outpatient. The wound does not have any active discharge at this time.   Consultants:  [facility]- Catha Gosselin, RN  Procedures:  None  Antibiotics:  Levaquin 5/26 >>  HPI/Subjective: Patient reports pain presently in her upper back and chest area. She states that the patient was initially also her legs however this has resolved. Her current pain as an intensity of 5-7/10. She describes the pain as aching, nonradiating. She does have nausea and vomiting which started yesterday. The patient has had no episode of vomiting today but does still continue to feel nauseous. She denies any abdominal pain or tenderness. Patient states that she's been unable to seek medical care due to the lack of provider coverage. Patient states that she normally has jaundice at her baseline.  Objective: Filed Vitals:   10/14/12 2215 10/15/12 0459 10/15/12 1300 10/15/12 1502  BP: 133/76 118/77 121/73   Pulse: 68 76 73   Temp: 98.1 F (36.7 C) 97.6 F (36.4 C) 98 F (36.7 C)   TempSrc: Oral Oral Oral   Resp: 18  15   Height:      Weight:      SpO2: 100% 100% 100% 99%   Weight change:   Intake/Output Summary (Last 24  hours) at 10/15/12 1718 Last data filed at 10/15/12 1655  Gross per 24 hour  Intake   1710 ml  Output   3300 ml  Net  -1590 ml    General: Alert, awake, oriented x3, in no acute distress. Patient appears chronically ill. HEENT: [place]/AT PEERL, EOMI, mild icterus Neck: Trachea midline,  no masses, no thyromegal,y no JVD, no carotid bruit OROPHARYNX:  Moist, No exudate/ erythema/lesions.  Heart: Regular rate and rhythm, mild tachycardia, without murmurs, rubs, gallops.  Lungs: Clear to auscultation, no wheezing or rhonchi noted.  Abdomen: Soft, nontender, nondistended, positive bowel sounds, no masses.  Neuro: No focal neurological deficits noted. Strength functional in bilateral upper and lower extremities. Musculoskeletal: No warm swelling or erythema around joints, no spinal tenderness noted. Psychiatric: Patient alert and oriented x3, good insight and cognition, good recent to remote recall. Appears depressed however does not want to complete the PHQ-9.   Data Reviewed: Basic Metabolic Panel:  Recent Labs Lab 10/10/12 0008 10/10/12 0935 10/11/12 0540 10/13/12 0520 10/14/12 0423  NA 137  --  137 136 136  K 4.1  --  4.1 3.5 3.4*  CL 98  --  102 97 97  CO2 23  --  25 35* 33*  GLUCOSE 135*  --  98 113* 96  BUN 9  --  7 5* 5*  CREATININE 0.55 0.34* 0.39* 0.33* 0.35*  CALCIUM 10.1  --  8.8 8.8 8.6  MG  --   --   --   --  2.1   Liver Function Tests:  Recent Labs Lab 10/10/12 0008 10/13/12 0520 10/14/12 0423  AST 42* 30 28  ALT 23 21 20   ALKPHOS 53 93 144*  BILITOT 4.7* 3.4* 4.0*  PROT 9.1* 6.7 6.6  ALBUMIN 4.9 3.2* 3.0*   No results found for this basename: LIPASE, AMYLASE,  in the last 168 hours No results found for this basename: AMMONIA,  in the last 168 hours CBC:  Recent Labs Lab 10/10/12 0008 10/10/12 0935 10/11/12 0540 10/12/12 0447 10/13/12 0520 10/14/12 0423  WBC 25.8* 27.7* 28.1* 23.0* 14.1* 11.5*  NEUTROABS 18.0*  --   --  10.6* 10.9* 7.3  HGB  7.1* 5.7* 6.7* 6.9* 6.0* 7.0*  HCT 19.3* 15.4* 18.7* 20.2* 16.7* 20.4*  MCV 81.4 80.6 81.3 82.8 81.5 81.6  PLT 395 392 335 270 287 302   Cardiac Enzymes: No results found for this basename: CKTOTAL, CKMB, CKMBINDEX, TROPONINI,  in the last 168 hours BNP (last 3 results) No results found for this basename: PROBNP,  in the last 8760 hours CBG: No results found for this basename: GLUCAP,  in the last 168 hours  Recent Results (from the past 240 hour(s))  CULTURE, BLOOD (ROUTINE X 2)     Status: None   Collection Time    10/10/12  3:40 AM      Result Value Range Status   Specimen Description BLOOD LEFT ARM   Final   Special Requests BOTTLES DRAWN AEROBIC AND ANAEROBIC 10CC EA   Final   Culture  Setup Time 10/13/2012 04:43   Final   Culture     Final   Value:        BLOOD CULTURE RECEIVED NO GROWTH TO DATE CULTURE WILL BE HELD FOR 5 DAYS BEFORE ISSUING A FINAL NEGATIVE REPORT   Report Status PENDING   Incomplete  CULTURE, BLOOD (ROUTINE X 2)     Status: None   Collection Time    10/10/12  3:50 AM      Result Value Range Status   Specimen Description BLOOD RIGHT HAND   Final   Special Requests BOTTLES DRAWN AEROBIC ONLY 6CC   Final   Culture  Setup Time 10/13/2012 04:43   Final   Culture     Final   Value:        BLOOD CULTURE RECEIVED NO GROWTH TO DATE CULTURE WILL BE HELD FOR 5 DAYS BEFORE ISSUING A FINAL NEGATIVE REPORT   Report Status PENDING   Incomplete     Studies: Dg Chest 1 View  10/12/2012   *RADIOLOGY REPORT*  Clinical Data: Shortness of breath, sickle cell anemia, upper back/chest pain  CHEST - 1 VIEW  Comparison: 10/10/2012  Findings: Increased interstitial markings, favored to reflect mild interstitial edema.  Small bilateral pleural effusions.  Associated lower lobe opacities, likely atelectasis, pneumonia not excluded.  Cardiomegaly.  IMPRESSION: Cardiomegaly with mild interstitial edema and small bilateral pleural effusions.  Patchy bilateral lower lobe opacities,  likely atelectasis, pneumonia not excluded.   Original Report Authenticated By: Charline Bills, M.D.   Dg Chest 2 View  10/10/2012   *RADIOLOGY REPORT*  Clinical Data: Chest pain; history of sickle cell disease.  CHEST - 2 VIEW  Comparison: Chest radiograph from 03/30/2012  Findings: The lungs are well-aerated and clear.  There is no evidence of focal opacification, pleural effusion or pneumothorax. Pulmonary vascularity is at the upper limits of normal.  The heart is borderline enlarged.  No acute osseous abnormalities are seen.  IMPRESSION: Borderline cardiomegaly; no acute cardiopulmonary process seen.   Original Report Authenticated By: Tonia Ghent, M.D.   US Abdomen Complete  10/10/2012   *RADIOLOGY REPORT*  Clinical Data:  Cholelithiasis  COMPLETE ABDOMINAL ULTRASOUND  Comparison:  03/30/2012  Findings:  Gallbladder:  Multiple small gallstones.  Gallbladder wall thickening or pericholecystic fluid.  Negative sonographic Murphy's sign.  Common bile duct:  Measures 3 mm.  Liver:  No focal lesion identified.  Within normal limits in parenchymal echogenicity.  IVC:  Appears normal.  Pancreas:  Incompletely visualized but grossly unremarkable.  Spleen:  Measures 5.1 cm.  Poorly visualized and mildly heterogeneous, likely related to history of sickle cell disease.  Right Kidney:  Measures 15.5 cm.  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  No hydronephrosis.  Left Kidney:  Measures 13.0 cm.  No mass or hydronephrosis.  Abdominal aorta:  No aneurysm identified.  IMPRESSION: Cholelithiasis, without associated findings to suggest acute cholecystitis.  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   Original Report Authenticated By: Charline Bills, M.D.    Scheduled Meds: . enoxaparin (LOVENOX) injection  40 mg Subcutaneous Q24H  . folic acid  1 mg Oral Daily  . levofloxacin  750  mg Oral Q24H  . oxyCODONE  5 mg Oral Q4H  . polyethylene glycol  17 g Oral Daily  . senna-docusate  1 tablet Oral BID  . sodium chloride  3 mL Intravenous Q12H   Continuous Infusions:   Time Spent-35 minutes.

## 2012-10-16 ENCOUNTER — Inpatient Hospital Stay (HOSPITAL_COMMUNITY): Payer: Medicaid Other

## 2012-10-16 LAB — URINALYSIS, ROUTINE W REFLEX MICROSCOPIC
Bilirubin Urine: NEGATIVE
Hgb urine dipstick: NEGATIVE
Ketones, ur: NEGATIVE mg/dL
Protein, ur: NEGATIVE mg/dL
Urobilinogen, UA: 1 mg/dL (ref 0.0–1.0)

## 2012-10-16 LAB — CBC WITH DIFFERENTIAL/PLATELET
Basophils Absolute: 0.2 10*3/uL — ABNORMAL HIGH (ref 0.0–0.1)
Basophils Relative: 1 % (ref 0–1)
HCT: 23 % — ABNORMAL LOW (ref 36.0–46.0)
Hemoglobin: 7.8 g/dL — ABNORMAL LOW (ref 12.0–15.0)
Lymphocytes Relative: 9 % — ABNORMAL LOW (ref 12–46)
Monocytes Relative: 18 % — ABNORMAL HIGH (ref 3–12)
Neutro Abs: 11.1 10*3/uL — ABNORMAL HIGH (ref 1.7–7.7)
RBC: 2.85 MIL/uL — ABNORMAL LOW (ref 3.87–5.11)
WBC: 16.6 10*3/uL — ABNORMAL HIGH (ref 4.0–10.5)

## 2012-10-16 LAB — COMPREHENSIVE METABOLIC PANEL
Albumin: 3.3 g/dL — ABNORMAL LOW (ref 3.5–5.2)
Alkaline Phosphatase: 213 U/L — ABNORMAL HIGH (ref 39–117)
BUN: 3 mg/dL — ABNORMAL LOW (ref 6–23)
Chloride: 94 mEq/L — ABNORMAL LOW (ref 96–112)
Potassium: 3.7 mEq/L (ref 3.5–5.1)
Total Bilirubin: 3 mg/dL — ABNORMAL HIGH (ref 0.3–1.2)

## 2012-10-16 MED ORDER — VANCOMYCIN HCL 500 MG IV SOLR
500.0000 mg | Freq: Three times a day (TID) | INTRAVENOUS | Status: DC
  Administered 2012-10-16 – 2012-10-17 (×4): 500 mg via INTRAVENOUS
  Filled 2012-10-16 (×5): qty 500

## 2012-10-16 MED ORDER — PIPERACILLIN-TAZOBACTAM 3.375 G IVPB
3.3750 g | Freq: Three times a day (TID) | INTRAVENOUS | Status: DC
  Administered 2012-10-16 – 2012-10-17 (×4): 3.375 g via INTRAVENOUS
  Filled 2012-10-16 (×5): qty 50

## 2012-10-16 MED ORDER — DEXTROSE 5 % IV SOLN
500.0000 mg | INTRAVENOUS | Status: DC
  Administered 2012-10-16 – 2012-10-20 (×5): 500 mg via INTRAVENOUS
  Filled 2012-10-16 (×5): qty 500

## 2012-10-16 NOTE — Progress Notes (Signed)
Assessment/Plan: @PROBHOSP @  LOS: 6 days   1. Hb SS with Acute Chest Syndrome: Etiology likely secondary to her current pneumonia.  2. Sickle cell pain with crisis:increase dilaudid to 1-2 mg every 3 hours as needed. Continue scheduled toradol. 3. Leukocytosis/CAP. Patient febrile overnight, with resurgence of WBC. Repeat blood and urine cultures. Repeat CXR. Will broaden antibiotics. CBC in AM. Completed 5 days Levaquin so far inpatient. 4. Anemia: Likely secondary to hemolysis. Transfused one unit of packed red blood cell. Tachycardia resolved. 5. Nausea and vomiting:Resolved. Pt tolerating regular diet  7. Chronic leg ulcers: This is likely mediated by decreased oxygen-carrying capacity to the skin and they're to vaso-occlusive disease. The wounds do not appear to be infected at this time and patient has been seen by wound ostomy care was recommended presence of the wound. Monitor.  8. Chronic pain syndrome: Patient has some degree of chronic pain which she manages with Tylenol. The patient has no opiate medications or NSAIDs for management of her pain. Patient indicated excessive use of tylenol during her interview with Dr Ashley Royalty, will continue to educate patient on toxic dose tylenol, alternating tylenol with NSAIDs discussed but patient has no prescription coverage and is unable to obtain a prescription NSAIDs or opiates. Social work and case management to see the patient to see what assistance can be offered. 9. Cholelithiasis: The patient has a history of cholelithiasis and her last admission had acute cholecystitis. She was supposed to followup with general surgery for consideration of removal of gallbladder, however she did not followup due to lack of insurance. The patient denies any ongoing symptoms and states that her nausea and vomiting started only one day ago, currently resolved 10. 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 She has  not been on any disease modifying medications such as folic acid and hydroxyurea. The patient has been started on folic acid here in the hospital however hydroxyurea is on hold due to her low indices.   Code Status: Full code  Family Communication: Not applicable  Disposition Plan: Home at the time of discharge    Subjective: Patient seen, she had a fever Tmax 101 last night and resurgence of her WBC count. sge denies any worsening cough, any SOB, any BOU. She states her pain is okay at 7/10, located along her back and sides, no c/o chest pain.   Objective: Weight change:   Intake/Output Summary (Last 24 hours) at 10/16/12 0937 Last data filed at 10/16/12 1478  Gross per 24 hour  Intake    340 ml  Output   2300 ml  Net  -1960 ml   BP 131/79  Pulse 86  Temp(Src) 101.1 F (38.4 C) (Oral)  Resp 14  Ht 5\' 7"  (1.702 m)  Wt 58.06 kg (128 lb)  BMI 20.04 kg/m2  SpO2 100%  LMP 10/01/2012  General Appearance:    Alert, cooperative, no distress, appears stated age, slim  Head:    Normocephalic, without obvious abnormality, atraumatic  Eyes:    PERRL, conjunctiva jaundiced     Nose:   Nares normal, septum midline, mucosa normal, no drainage    or sinus tenderness  Throat:   Somewhat dry oral mucosa and lips, tongue normal; teeth and gums normal  Neck:   Supple, symmetrical, trachea midline, no adenopathy;    thyroid:  no enlargement/tenderness/nodules; no carotid   bruit or JVD  Back:     Symmetric, no curvature, ROM normal, no CVA tenderness  Lungs:     Clear to auscultation bilaterally, respirations unlabored  Chest Wall:    No tenderness or deformity   Heart:    Regular rate and rhythm, S1 and S2 normal, no murmur, rub   or gallop     Abdomen:     Soft, non-tender, bowel sounds active all four quadrants,    no masses, no organomegaly        Extremities:   Extremities normal, atraumatic, no cyanosis or edema  Pulses:   2+ and symmetric all extremities  Skin:   Skin color,  texture, turgor normal, no rashes or lesions, ulcer on right ankle dry, no erythema, warmth or drainage  Lymph nodes:   Cervical, supraclavicular, and axillary nodes normal  Neurologic:   CNII-XII intact, normal strength, sensation and reflexes    throughout    Lab Results:  Recent Labs  10/14/12 0423 10/16/12 0430  NA 136 133*  K 3.4* 3.7  CL 97 94*  CO2 33* 31  GLUCOSE 96 115*  BUN 5* 3*  CREATININE 0.35* 0.37*  CALCIUM 8.6 9.5  MG 2.1  --     Recent Labs  10/14/12 0423 10/16/12 0430  AST 28 57*  ALT 20 55*  ALKPHOS 144* 213*  BILITOT 4.0* 3.0*  PROT 6.6 7.5  ALBUMIN 3.0* 3.3*   No results found for this basename: LIPASE, AMYLASE,  in the last 72 hours  Recent Labs  10/14/12 0423 10/16/12 0430  WBC 11.5* 16.6*  NEUTROABS 7.3 11.1*  HGB 7.0* 7.8*  HCT 20.4* 23.0*  MCV 81.6 80.7  PLT 302 365   No results found for this basename: CKTOTAL, CKMB, CKMBINDEX, TROPONINI,  in the last 72 hours No components found with this basename: POCBNP,  No results found for this basename: DDIMER,  in the last 72 hours No results found for this basename: HGBA1C,  in the last 72 hours No results found for this basename: CHOL, HDL, LDLCALC, TRIG, CHOLHDL, LDLDIRECT,  in the last 72 hours No results found for this basename: TSH, T4TOTAL, FREET3, T3FREE, THYROIDAB,  in the last 72 hours No results found for this basename: VITAMINB12, FOLATE, FERRITIN, TIBC, IRON, RETICCTPCT,  in the last 72 hours  Micro Results: Recent Results (from the past 240 hour(s))  CULTURE, BLOOD (ROUTINE X 2)     Status: None   Collection Time    10/10/12  3:40 AM      Result Value Range Status   Specimen Description BLOOD LEFT ARM   Final   Special Requests BOTTLES DRAWN AEROBIC AND ANAEROBIC 10CC EA   Final   Culture  Setup Time 10/13/2012 04:43   Final   Culture     Final   Value:        BLOOD CULTURE RECEIVED NO GROWTH TO DATE CULTURE WILL BE HELD FOR 5 DAYS BEFORE ISSUING A FINAL NEGATIVE REPORT    Report Status PENDING   Incomplete  CULTURE, BLOOD (ROUTINE X 2)     Status: None   Collection Time    10/10/12  3:50 AM      Result Value Range Status   Specimen Description BLOOD RIGHT HAND   Final   Special Requests BOTTLES DRAWN AEROBIC ONLY [facility]   Final   Culture  Setup Time 10/13/2012 04:43   Final   Culture     Final   Value:        BLOOD CULTURE RECEIVED NO GROWTH TO DATE CULTURE WILL BE HELD FOR 5  DAYS BEFORE ISSUING A FINAL NEGATIVE REPORT   Report Status PENDING   Incomplete    Studies/Results: Dg Chest 1 View  10/12/2012   *RADIOLOGY REPORT*  Clinical Data: Shortness of breath, sickle cell anemia, upper back/chest pain  CHEST - 1 VIEW  Comparison: 10/10/2012  Findings: Increased interstitial markings, favored to reflect mild interstitial edema.  Small bilateral pleural effusions.  Associated lower lobe opacities, likely atelectasis, pneumonia not excluded.  Cardiomegaly.  IMPRESSION: Cardiomegaly with mild interstitial edema and small bilateral pleural effusions.  Patchy bilateral lower lobe opacities, likely atelectasis, pneumonia not excluded.   Original Report Authenticated By: Charline Bills, M.D.   Dg Chest 2 View  10/10/2012   *RADIOLOGY REPORT*  Clinical Data: Chest pain; history of sickle cell disease.  CHEST - 2 VIEW  Comparison: Chest radiograph from 03/30/2012  Findings: The lungs are well-aerated and clear.  There is no evidence of focal opacification, pleural effusion or pneumothorax. Pulmonary vascularity is at the upper limits of normal.  The heart is borderline enlarged.  No acute osseous abnormalities are seen.  IMPRESSION: Borderline cardiomegaly; no acute cardiopulmonary process seen.   Original Report Authenticated By: Tonia Ghent, M.D.   US Abdomen Complete  10/10/2012   *RADIOLOGY REPORT*  Clinical Data:  Cholelithiasis  COMPLETE ABDOMINAL ULTRASOUND  Comparison:  03/30/2012  Findings:  Gallbladder:  Multiple small gallstones.  Gallbladder wall thickening  or pericholecystic fluid.  Negative sonographic Murphy's sign.  Common bile duct:  Measures 3 mm.  Liver:  No focal lesion identified.  Within normal limits in parenchymal echogenicity.  IVC:  Appears normal.  Pancreas:  Incompletely visualized but grossly unremarkable.  Spleen:  Measures 5.1 cm.  Poorly visualized and mildly heterogeneous, likely related to history of sickle cell disease.  Right Kidney:  Measures 15.5 cm.  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  No hydronephrosis.  Left Kidney:  Measures 13.0 cm.  No mass or hydronephrosis.  Abdominal aorta:  No aneurysm identified.  IMPRESSION: Cholelithiasis, without associated findings to suggest acute cholecystitis.  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   Original Report Authenticated By: Charline Bills, M.D.   Medications: Scheduled Meds: . azithromycin  500 mg Intravenous Q24H  . enoxaparin (LOVENOX) injection  40 mg Subcutaneous Q24H  . folic acid  1 mg Oral Daily  . oxyCODONE  5 mg Oral Q4H  . piperacillin-tazobactam (ZOSYN)  IV  3.375 g Intravenous Q8H  . polyethylene glycol  17 g Oral Daily  . senna-docusate  1 tablet Oral BID  . sodium chloride  3 mL Intravenous Q12H   Continuous Infusions:  PRN Meds:.acetaminophen, acetaminophen, diphenhydrAMINE, HYDROmorphone (DILAUDID) injection, ondansetron (ZOFRAN) IV, ondansetron    [name] 10/16/2012, 9:37 AM

## 2012-10-16 NOTE — Care Management (Signed)
This CM spoke with [name] regarding new patient paperwork and patient stated she has not felt well enough to complete paperwork. [name] will complete and return to [place] at a later date. [name] stated she does not have any other concerns or needs at this time.   [name] Caldwell, RN, BSN, [place]   [phone]

## 2012-10-16 NOTE — Progress Notes (Signed)
ANTIBIOTIC CONSULT NOTE - INITIAL  Pharmacy Consult for vancomycin Indication: pneumonia  No Known Allergies  Patient Measurements: Height: 5\' 7"  (170.2 cm) Weight: 128 lb (58.06 kg) IBW/kg (Calculated) : 61.6  Vital Signs: Temp: 101.1 F (38.4 C) (05/30 0622) Temp src: Oral (05/30 0622) BP: 131/79 mmHg (05/30 0622) Pulse Rate: 86 (05/30 0622) Intake/Output from previous day: 05/29 0701 - 05/30 0700 In: 480 [P.O.:480] Out: 2300 [Urine:2300] Intake/Output from this shift: Total I/O In: 100 [P.O.:100] Out: -   Labs:  Recent Labs  10/14/12 0423 10/16/12 0430  WBC 11.5* 16.6*  HGB 7.0* 7.8*  PLT 302 365  CREATININE 0.35* 0.37*   Estimated Creatinine Clearance: 84.9 ml/min (by C-G formula based on Cr of 0.37). No results found for this basename: VANCOTROUGH, VANCOPEAK, VANCORANDOM, GENTTROUGH, GENTPEAK, GENTRANDOM, TOBRATROUGH, TOBRAPEAK, TOBRARND, AMIKACINPEAK, AMIKACINTROU, AMIKACIN,  in the last 72 hours   Microbiology: Recent Results (from the past 720 hour(s))  CULTURE, BLOOD (ROUTINE X 2)     Status: None   Collection Time    10/10/12  3:40 AM      Result Value Range Status   Specimen Description BLOOD LEFT ARM   Final   Special Requests BOTTLES DRAWN AEROBIC AND ANAEROBIC 10CC EA   Final   Culture  Setup Time 10/13/2012 04:43   Final   Culture     Final   Value:        BLOOD CULTURE RECEIVED NO GROWTH TO DATE CULTURE WILL BE HELD FOR 5 DAYS BEFORE ISSUING A FINAL NEGATIVE REPORT   Report Status PENDING   Incomplete  CULTURE, BLOOD (ROUTINE X 2)     Status: None   Collection Time    10/10/12  3:50 AM      Result Value Range Status   Specimen Description BLOOD RIGHT HAND   Final   Special Requests BOTTLES DRAWN AEROBIC ONLY [facility]   Final   Culture  Setup Time 10/13/2012 04:43   Final   Culture     Final   Value:        BLOOD CULTURE RECEIVED NO GROWTH TO DATE CULTURE WILL BE HELD FOR 5 DAYS BEFORE ISSUING A FINAL NEGATIVE REPORT   Report Status PENDING    Incomplete    Medical History: Past Medical History  Diagnosis Date  . Sickle cell disease   . Anemia 03/30/2012    Hx of sickle cell disease    Medications:  Scheduled:  . azithromycin  500 mg Intravenous Q24H  . enoxaparin (LOVENOX) injection  40 mg Subcutaneous Q24H  . folic acid  1 mg Oral Daily  . oxyCODONE  5 mg Oral Q4H  . piperacillin-tazobactam (ZOSYN)  IV  3.375 g Intravenous Q8H  . polyethylene glycol  17 g Oral Daily  . senna-docusate  1 tablet Oral BID  . sodium chloride  3 mL Intravenous Q12H   Infusions:    Assessment: 42 yo female with sickle cell disease admitted on 5/24 with crisis started on Levaquin now D5 but to change abx's to Zithromax/Zosyn per Md and Vancomycin per pharmacy dosing due to rise in WBC, fever, and suspected HAP  Tmax 101.1  WBC 16.6 (rising)  Renal function stable, CrCl 85 CG  Goal of Therapy:  Vancomycin trough level 15-20 mcg/ml  Plan:  1) Vancomycin 500mg  IV q8 2) vanc trough at steady state if necessary   Hessie Knows, PharmD, BCPS Pager [phone] 10/16/2012 9:46 AM

## 2012-10-17 LAB — CBC
HCT: 22.4 % — ABNORMAL LOW (ref 36.0–46.0)
MCH: 26.6 pg (ref 26.0–34.0)
MCHC: 33.3 g/dL (ref 30.0–36.0)
MCHC: 33.5 g/dL (ref 30.0–36.0)
MCV: 79.7 fL (ref 78.0–100.0)
Platelets: 376 10*3/uL (ref 150–400)
Platelets: 367 10*3/uL (ref 150–400)
RDW: 18.5 % — ABNORMAL HIGH (ref 11.5–15.5)
RDW: 18.8 % — ABNORMAL HIGH (ref 11.5–15.5)

## 2012-10-17 LAB — COMPREHENSIVE METABOLIC PANEL
AST: 39 U/L — ABNORMAL HIGH (ref 0–37)
Albumin: 3.2 g/dL — ABNORMAL LOW (ref 3.5–5.2)
Calcium: 9.2 mg/dL (ref 8.4–10.5)
Creatinine, Ser: 0.36 mg/dL — ABNORMAL LOW (ref 0.50–1.10)
GFR calc Af Amer: >90 mL/min (ref >90)
GFR calc non Af Amer: >90 mL/min (ref >90)
Total Bilirubin: 3.1 mg/dL — ABNORMAL HIGH (ref 0.3–1.2)

## 2012-10-17 MED ORDER — VANCOMYCIN HCL IN DEXTROSE 1-5 GM/200ML-% IV SOLN
1000.0000 mg | Freq: Three times a day (TID) | INTRAVENOUS | Status: DC
  Administered 2012-10-17 – 2012-10-20 (×9): 1000 mg via INTRAVENOUS
  Filled 2012-10-17 (×11): qty 200

## 2012-10-17 MED ORDER — SODIUM CHLORIDE 0.9 % IV SOLN
500.0000 mg | Freq: Three times a day (TID) | INTRAVENOUS | Status: DC
  Administered 2012-10-17 – 2012-10-18 (×3): 500 mg via INTRAVENOUS
  Filled 2012-10-17 (×4): qty 500

## 2012-10-17 MED ORDER — HYDROMORPHONE HCL PF 1 MG/ML IJ SOLN
1.0000 mg | INTRAMUSCULAR | Status: DC | PRN
  Administered 2012-10-17 (×2): 1 mg via INTRAVENOUS
  Administered 2012-10-18 (×5): 2 mg via INTRAVENOUS
  Administered 2012-10-18 (×2): 1 mg via INTRAVENOUS
  Administered 2012-10-19 (×2): 2 mg via INTRAVENOUS
  Filled 2012-10-17 (×2): qty 1
  Filled 2012-10-17: qty 2
  Filled 2012-10-17: qty 1
  Filled 2012-10-17 (×2): qty 2
  Filled 2012-10-17: qty 1
  Filled 2012-10-17 (×2): qty 2
  Filled 2012-10-17: qty 1
  Filled 2012-10-17: qty 2
  Filled 2012-10-17: qty 1

## 2012-10-17 MED ORDER — KETOROLAC TROMETHAMINE 15 MG/ML IJ SOLN
15.0000 mg | Freq: Three times a day (TID) | INTRAMUSCULAR | Status: DC
  Administered 2012-10-17 – 2012-10-20 (×11): 15 mg via INTRAVENOUS
  Filled 2012-10-17 (×13): qty 1

## 2012-10-17 NOTE — Progress Notes (Signed)
Assessment/Plan: @PROBHOSP @  LOS: 7 days   Sickle cell pain with crisis: increase dilaudid to 1-2 mg every 3 hours as needed. Add scheduled Toradol and K pad  Leukocytosis/CAP. Patient afebrile overnight, WBC continues to rise but with a slower rate of rise, will order a CBC at noon. Antibiotics adjusted yesterday. Repeat blood results pending, urine negative. Repeat CXR shows pneumonia was not improved.  Anemia: Likely secondary to hemolysis. Transfused one unit of packed red blood cell. Tachycardia resolved.       Nausea and vomiting:Resolved. Pt tolerating regular diet  Chronic leg ulcers: This is likely mediated by decreased oxygen-carrying capacity to the skin and they're to vaso-occlusive disease. The wounds do not appear to be infected at this time and patient has been seen by wound ostomy care was recommended presence of the wound. Monitor.  Chronic pain syndrome: Patient has some degree of chronic pain which she manages with Tylenol. The patient has no opiate medications or NSAIDs for management of her pain. Patient indicated excessive use of tylenol during her interview with Dr Ashley Royalty, will continue to educate patient on toxic dose tylenol, alternating tylenol with NSAIDs discussed but patient has no prescription coverage and is unable to obtain a prescription NSAIDs or opiates. Social work and case management to see the patient to see what assistance can be offered. Cholelithiasis: The patient has a history of cholelithiasis and her last admission had acute cholecystitis. She was supposed to followup with general surgery for consideration of removal of gallbladder, however she did not followup due to lack of insurance. The patient denies any ongoing symptoms and states that her nausea and vomiting started only one day ago, currently resolved Sickle cell disease: Based on her last hemoglobin electrophoresis the patient has hemoglobin SS. She has not been on any disease modifying medications  such as folic acid and hydroxyurea. The patient has been started on folic acid here in the hospital however hydroxyurea is on hold due to her low indices.   Code Status: Full code  Family Communication: Not applicable  Disposition Plan: Home at the time of discharge    Subjective: Patient seen, complains of pain 9/10 poor control in back and sides, similar distribution as yesterday. She denies any GI symptoms - no nausea, no diarrhea. She denies any cough or worsening SOB. Patient still not ambulating much, hopefully will be more inclined to do so when pain better controlled.  Objective: Weight change:   Intake/Output Summary (Last 24 hours) at 10/17/12 0757 Last data filed at 10/17/12 0325  Gross per 24 hour  Intake    550 ml  Output   1925 ml  Net  -1375 ml   BP 143/81  Pulse 100  Temp(Src) 99.3 F (37.4 C) (Oral)  Resp 18  Ht 5\' 7"  (1.702 m)  Wt 58.06 kg (128 lb)  BMI 20.04 kg/m2  SpO2 96%  LMP 10/01/2012  General Appearance:    Alert, cooperative, no distress, appears stated age  Head:    Normocephalic, without obvious abnormality, atraumatic  Eyes:    PERRL, conjunctiva/corneas clear,     Nose:   Nares normal, septum midline, mucosa normal, no drainage    or sinus tenderness  Throat:   Lips, mucosa, and tongue normal; teeth and gums normal  Neck:   Supple, symmetrical, trachea midline, no adenopathy;    thyroid:  no enlargement/tenderness/nodules; no carotid   bruit or JVD  Back:     Symmetric, no curvature, ROM normal, no CVA tenderness  Lungs:     Clear to auscultation bilaterally, respirations unlabored  Chest Wall:    No tenderness or deformity   Heart:    Regular rate and rhythm, S1 and S2 normal, no murmur, rub   or gallop     Abdomen:     Soft, non-tender, bowel sounds active all four quadrants,    no masses, no organomegaly, round belly (pt states this is her baseline)        Extremities:   Extremities normal, atraumatic, no cyanosis or edema  Pulses:    2+ and symmetric all extremities  Skin:   Skin color, texture, turgor normal, no rashes or lesions  Lymph nodes:   Cervical, supraclavicular, and axillary nodes normal  Neurologic:   CNII-XII intact, normal strength, sensation and reflexes    throughout    Lab Results:  Recent Labs  10/16/12 0430 10/17/12 0551  NA 133* 132*  K 3.7 3.4*  CL 94* 94*  CO2 31 29  GLUCOSE 115* 111*  BUN 3* 3*  CREATININE 0.37* 0.36*  CALCIUM 9.5 9.2    Recent Labs  10/16/12 0430 10/17/12 0551  AST 57* 39*  ALT 55* 47*  ALKPHOS 213* 177*  BILITOT 3.0* 3.1*  PROT 7.5 7.5  ALBUMIN 3.3* 3.2*   No results found for this basename: LIPASE, AMYLASE,  in the last 72 hours  Recent Labs  10/16/12 0430 10/17/12 0551  WBC 16.6* 18.3*  NEUTROABS 11.1*  --   HGB 7.8* 7.7*  HCT 23.0* 23.1*  MCV 80.7 79.7  PLT 365 376   No results found for this basename: CKTOTAL, CKMB, CKMBINDEX, TROPONINI,  in the last 72 hours No components found with this basename: POCBNP,  No results found for this basename: DDIMER,  in the last 72 hours No results found for this basename: HGBA1C,  in the last 72 hours No results found for this basename: CHOL, HDL, LDLCALC, TRIG, CHOLHDL, LDLDIRECT,  in the last 72 hours No results found for this basename: TSH, T4TOTAL, FREET3, T3FREE, THYROIDAB,  in the last 72 hours No results found for this basename: VITAMINB12, FOLATE, FERRITIN, TIBC, IRON, RETICCTPCT,  in the last 72 hours  Micro Results: Recent Results (from the past 240 hour(s))  CULTURE, BLOOD (ROUTINE X 2)     Status: None   Collection Time    10/10/12  3:40 AM      Result Value Range Status   Specimen Description BLOOD LEFT ARM   Final   Special Requests BOTTLES DRAWN AEROBIC AND ANAEROBIC 10CC EA   Final   Culture  Setup Time 10/13/2012 04:43   Final   Culture     Final   Value:        BLOOD CULTURE RECEIVED NO GROWTH TO DATE CULTURE WILL BE HELD FOR 5 DAYS BEFORE ISSUING A FINAL NEGATIVE REPORT   Report  Status PENDING   Incomplete  CULTURE, BLOOD (ROUTINE X 2)     Status: None   Collection Time    10/10/12  3:50 AM      Result Value Range Status   Specimen Description BLOOD RIGHT HAND   Final   Special Requests BOTTLES DRAWN AEROBIC ONLY [facility]   Final   Culture  Setup Time 10/13/2012 04:43   Final   Culture     Final   Value:        BLOOD CULTURE RECEIVED NO GROWTH TO DATE CULTURE WILL BE HELD FOR 5 DAYS BEFORE ISSUING A FINAL NEGATIVE  REPORT   Report Status PENDING   Incomplete    Studies/Results: Dg Chest 1 View  10/12/2012   *RADIOLOGY REPORT*  Clinical Data: Shortness of breath, sickle cell anemia, upper back/chest pain  CHEST - 1 VIEW  Comparison: 10/10/2012  Findings: Increased interstitial markings, favored to reflect mild interstitial edema.  Small bilateral pleural effusions.  Associated lower lobe opacities, likely atelectasis, pneumonia not excluded.  Cardiomegaly.  IMPRESSION: Cardiomegaly with mild interstitial edema and small bilateral pleural effusions.  Patchy bilateral lower lobe opacities, likely atelectasis, pneumonia not excluded.   Original Report Authenticated By: Charline Bills, M.D.   Dg Chest 2 View  10/16/2012   *RADIOLOGY REPORT*  Clinical Data: Sickle cell crisis.  Left shoulder pain.  Shortness of breath.  Current history of hypertension.  Follow up pleural effusions and edema.  CHEST - 2 VIEW  Comparison: Portable chest x-rays 10/12/2012, 03/30/2012.  Two-view chest x-ray 10/10/2012, 03/29/2012.  Findings: Interval increase in the bilateral pleural effusions, right greater than left.  Worsening aeration in the lower lobes. Mild pulmonary venous hypertension without residual interstitial pulmonary edema.  Cardiac silhouette enlarged but stable.  Hilar and mediastinal contours otherwise unremarkable.  Visualized bony thorax intact.  IMPRESSION: Increasing bilateral pleural effusions, right greater than left, and associated passive atelectasis and/or pneumonia in the  lower lobes.  Interval resolution of interstitial pulmonary edema.   Original Report Authenticated By: Hulan Saas, M.D.   Dg Chest 2 View  10/10/2012   *RADIOLOGY REPORT*  Clinical Data: Chest pain; history of sickle cell disease.  CHEST - 2 VIEW  Comparison: Chest radiograph from 03/30/2012  Findings: The lungs are well-aerated and clear.  There is no evidence of focal opacification, pleural effusion or pneumothorax. Pulmonary vascularity is at the upper limits of normal.  The heart is borderline enlarged.  No acute osseous abnormalities are seen.  IMPRESSION: Borderline cardiomegaly; no acute cardiopulmonary process seen.   Original Report Authenticated By: Tonia Ghent, M.D.   US Abdomen Complete  10/10/2012   *RADIOLOGY REPORT*  Clinical Data:  Cholelithiasis  COMPLETE ABDOMINAL ULTRASOUND  Comparison:  03/30/2012  Findings:  Gallbladder:  Multiple small gallstones.  Gallbladder wall thickening or pericholecystic fluid.  Negative sonographic Murphy's sign.  Common bile duct:  Measures 3 mm.  Liver:  No focal lesion identified.  Within normal limits in parenchymal echogenicity.  IVC:  Appears normal.  Pancreas:  Incompletely visualized but grossly unremarkable.  Spleen:  Measures 5.1 cm.  Poorly visualized and mildly heterogeneous, likely related to history of sickle cell disease.  Right Kidney:  Measures 15.5 cm.  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  No hydronephrosis.  Left Kidney:  Measures 13.0 cm.  No mass or hydronephrosis.  Abdominal aorta:  No aneurysm identified.  IMPRESSION: Cholelithiasis, without associated findings to suggest acute cholecystitis.  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   Original Report Authenticated By: Charline Bills, M.D.   Medications: Scheduled Meds: . azithromycin  500 mg Intravenous Q24H  . enoxaparin (LOVENOX) injection   40 mg Subcutaneous Q24H  . folic acid  1 mg Oral Daily  . oxyCODONE  5 mg Oral Q4H  . piperacillin-tazobactam (ZOSYN)  IV  3.375 g Intravenous Q8H  . polyethylene glycol  17 g Oral Daily  . senna-docusate  1 tablet Oral BID  . sodium chloride  3 mL Intravenous Q12H  . vancomycin  500 mg Intravenous  Q8H   Continuous Infusions:  PRN Meds:.acetaminophen, acetaminophen, diphenhydrAMINE, HYDROmorphone (DILAUDID) injection, ondansetron (ZOFRAN) IV, ondansetron   [name] 10/17/2012, 7:57 AM

## 2012-10-17 NOTE — Progress Notes (Signed)
ANTIBIOTIC CONSULT NOTE - FOLLOW UP  Pharmacy Consult for Vancomycin Indication: rule out pneumonia  No Known Allergies  Patient Measurements: Height: 5\' 7"  (170.2 cm) Weight: 128 lb (58.06 kg) IBW/kg (Calculated) : 61.6  Vital Signs: Temp: 99.3 F (37.4 C) (05/31 0539) Temp src: Oral (05/31 0539) BP: 143/81 mmHg (05/31 0539) Pulse Rate: 100 (05/31 0539) Intake/Output from previous day: 05/30 0701 - 05/31 0700 In: 550 [P.O.:100; IV Piggyback:450] Out: 1925 [Urine:1925]  Labs:  Recent Labs  10/16/12 0430 10/17/12 0551  WBC 16.6* 18.3*  HGB 7.8* 7.7*  PLT 365 376  CREATININE 0.37* 0.36*   Estimated Creatinine Clearance: 84.9 ml/min (by C-G formula based on Cr of 0.36).  Recent Labs  10/17/12 0939  VANCOTROUGH <5.0*      Anti-infectives  5/26 >> levaquin >> 5/30 5/30 >> vancomycin >>  5/30 >> Zithromax >> 5/30 >> Zosyn >>   Assessment: 42 yo female with sickle cell disease admitted on 5/24 with crisis.  On 5/30, Day #5 Levaquin, patient had fever, leukocytosis and abx were changed.  Pharmacy consulted to dose vancomycin and Zosyn/azithromycin per MD for suspected HAP.  Renal fxn stable, CrCl ~ 85 ml/min  WBC increasing, Tm 99.3  Blood Cxt negative to date  5/31 0900 Vancomycin Trough < 5 (ordered by MD, before 4th dose) is subtherapeutic  Goal of Therapy:  Vancomycin trough level 15-20 mcg/ml  Plan:   Increase to Vancomycin 1g IV q8h.  Measure Vanc trough at steady state.  Follow up renal fxn and culture results.   Lynann Beaver PharmD, BCPS Pager [phone] 10/17/2012 12:00 PM

## 2012-10-18 ENCOUNTER — Encounter (HOSPITAL_COMMUNITY): Payer: Self-pay | Admitting: Radiology

## 2012-10-18 ENCOUNTER — Inpatient Hospital Stay (HOSPITAL_COMMUNITY): Payer: Medicaid Other

## 2012-10-18 LAB — BASIC METABOLIC PANEL
BUN: 5 mg/dL — ABNORMAL LOW (ref 6–23)
Creatinine, Ser: 0.44 mg/dL — ABNORMAL LOW (ref 0.50–1.10)
GFR calc Af Amer: >90 mL/min (ref >90)
GFR calc non Af Amer: >90 mL/min (ref >90)

## 2012-10-18 LAB — CBC WITH DIFFERENTIAL/PLATELET
Basophils Absolute: 0.2 10*3/uL — ABNORMAL HIGH (ref 0.0–0.1)
Lymphs Abs: 2 10*3/uL (ref 0.7–4.0)
MCV: 79.3 fL (ref 78.0–100.0)
Monocytes Absolute: 3.1 10*3/uL — ABNORMAL HIGH (ref 0.1–1.0)
Monocytes Relative: 20 % — ABNORMAL HIGH (ref 3–12)
Neutrophils Relative %: 56 % (ref 43–77)
Platelets: 391 10*3/uL (ref 150–400)
RDW: 19.3 % — ABNORMAL HIGH (ref 11.5–15.5)
WBC: 15.6 10*3/uL — ABNORMAL HIGH (ref 4.0–10.5)

## 2012-10-18 MED ORDER — IOHEXOL 300 MG/ML  SOLN
80.0000 mL | Freq: Once | INTRAMUSCULAR | Status: AC | PRN
  Administered 2012-10-18: 80 mL via INTRAVENOUS

## 2012-10-18 MED ORDER — SODIUM CHLORIDE 0.9 % IV SOLN
250.0000 mg | Freq: Four times a day (QID) | INTRAVENOUS | Status: DC
  Administered 2012-10-18 – 2012-10-20 (×7): 250 mg via INTRAVENOUS
  Filled 2012-10-18 (×10): qty 250

## 2012-10-18 MED ORDER — POTASSIUM CHLORIDE CRYS ER 20 MEQ PO TBCR
40.0000 meq | EXTENDED_RELEASE_TABLET | Freq: Once | ORAL | Status: AC
  Administered 2012-10-18: 40 meq via ORAL
  Filled 2012-10-18: qty 2

## 2012-10-18 MED ORDER — OXYCODONE-ACETAMINOPHEN 5-325 MG PO TABS: 1.0000 | ORAL_TABLET | ORAL | Status: DC | PRN

## 2012-10-18 NOTE — Progress Notes (Signed)
Assessment/Plan: @PROBHOSP @  LOS: 8 days   Sickle cell pain with crisis: Dilaudid increased to 1-2 mg every 3 hours as needed. Add scheduled Toradol and K pad.  Leukocytosis/CAP. Patient afebrile overnight, WBC finally improving, antibiotics changed to Imipenem yesterday after WBC rose to 21 on noontime labs. Consider d/cing Vancomycin in AM if WBC continues to trend down. CT chest ordered to evaluate CAP. Anemia: Likely secondary to hemolysis. Transfused one unit of packed red blood cell. Tachycardia resolved.  Nausea and vomiting: Resolved. Patient tolerating regular diet  Hypokalemia: Will replete PO, check magnesium level in AM. Chronic leg ulcers: This is likely mediated by decreased oxygen-carrying capacity to the skin and they're to vaso-occlusive disease. The wounds do not appear to be infected at this time and patient has been seen by wound ostomy care was recommended presence of the wound. Monitor.  Chronic pain syndrome: Patient has some degree of chronic pain which she manages with Tylenol. The patient has no opiate medications or NSAIDs for management of her pain. Patient indicated excessive use of tylenol during her interview with Dr Ashley Royalty, will continue to educate patient on toxic dose tylenol, alternating tylenol with NSAIDs discussed but patient has no prescription coverage and is unable to obtain a prescription NSAIDs or opiates. Social work and case management to see the patient to see what assistance can be offered.  Cholelithiasis: The patient has a history of cholelithiasis and her last admission had acute cholecystitis. She was supposed to followup with general surgery for consideration of removal of gallbladder, however she did not followup due to lack of insurance. 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 She has not been on any disease modifying medications such as folic acid and hydroxyurea. The patient has been started on folic acid here in the hospital however hydroxyurea is on hold due to her low indices.   Subjective: Patient seen, tolerating the higher dose of dilaudid, states pain improved yet she is still not walking much.   Objective: Weight change:   Intake/Output Summary (Last 24 hours) at 10/18/12 0734 Last data filed at 10/18/12 0139  Gross per 24 hour  Intake   1280 ml  Output    650 ml  Net    630 ml   BP 108/63  Pulse 72  Temp(Src) 98.5 F (36.9 C) (Oral)  Resp 16  Ht 5\' 7"  (1.702 m)  Wt 58.06 kg (128 lb)  BMI 20.04 kg/m2  SpO2 99%  LMP 10/01/2012  General Appearance:    Alert, cooperative, no distress, appears stated age  Head:    Normocephalic, without obvious abnormality, atraumatic  Eyes:    PERRL, conjunctiva/corneas clear     Nose:   Nares normal, septum midline, mucosa normal, no drainage    or sinus tenderness  Throat:   Lips, mucosa, and tongue normal; teeth and gums normal  Neck:   Supple, symmetrical, trachea midline, no adenopathy;    thyroid:  no enlargement/tenderness/nodules; no carotid   bruit or JVD  Back:     Symmetric, no curvature, ROM normal, no CVA tenderness  Lungs:     Clear to auscultation bilaterally, respirations unlabored  Chest Wall:    No tenderness or deformity   Heart:    Regular rate and rhythm, S1 and S2 normal, no murmur, rub   or gallop  Breast Exam:    No tenderness, masses, or nipple abnormality  Abdomen:     Soft, non-tender, bowel sounds active all four quadrants,    no masses, no organomegaly        Extremities:   Extremities normal, atraumatic, no cyanosis or edema  Pulses:   2+ and symmetric all extremities  Skin:   Skin color, texture, turgor normal, no rashes or lesions  Lymph nodes:   Cervical, supraclavicular, and axillary nodes normal  Neurologic:   CNII-XII intact, normal strength,  sensation and reflexes    throughout    Lab Results:  Recent Labs  10/17/12 0551 10/18/12 0505  NA 132* 136  K 3.4* 3.3*  CL 94* 99  CO2 29 29  GLUCOSE 111* 101*  BUN 3* 5*  CREATININE 0.36* 0.44*  CALCIUM 9.2 8.8    Recent Labs  10/16/12 0430 10/17/12 0551  AST 57* 39*  ALT 55* 47*  ALKPHOS 213* 177*  BILITOT 3.0* 3.1*  PROT 7.5 7.5  ALBUMIN 3.3* 3.2*   No results found for this basename: LIPASE, AMYLASE,  in the last 72 hours  Recent Labs  10/16/12 0430  10/17/12 1209 10/18/12 0505  WBC 16.6*  < > 21.0* 15.6*  NEUTROABS 11.1*  --   --  8.7*  HGB 7.8*  < > 7.5* 7.0*  HCT 23.0*  < > 22.4* 21.1*  MCV 80.7  < > 79.4 79.3  PLT 365  < > 367 391  < > = values in this interval not displayed. No results found for this basename: CKTOTAL, CKMB, CKMBINDEX, TROPONINI,  in the last 72 hours No components found with this basename: POCBNP,  No results found for this basename: DDIMER,  in the last 72 hours No results found for this basename: HGBA1C,  in the last 72 hours No results found for this basename: CHOL, HDL, LDLCALC, TRIG, CHOLHDL, LDLDIRECT,  in the last 72 hours No results found for this basename: TSH, T4TOTAL, FREET3, T3FREE, THYROIDAB,  in the last 72 hours No results found for this basename: VITAMINB12, FOLATE, FERRITIN, TIBC, IRON, RETICCTPCT,  in the last 72 hours  Micro Results: Recent Results (from the past 240 hour(s))  CULTURE, BLOOD (ROUTINE X 2)     Status: None   Collection Time    10/10/12  3:40 AM      Result Value Range Status   Specimen Description BLOOD LEFT ARM   Final   Special Requests BOTTLES DRAWN AEROBIC AND ANAEROBIC 10CC EA   Final   Culture  Setup Time 10/13/2012 04:43   Final   Culture     Final   Value:        BLOOD CULTURE RECEIVED NO GROWTH TO DATE CULTURE WILL BE HELD FOR 5 DAYS BEFORE ISSUING A FINAL NEGATIVE REPORT   Report Status PENDING   Incomplete  CULTURE, BLOOD (ROUTINE X 2)     Status: None   Collection Time     10/10/12  3:50 AM      Result Value Range Status   Specimen Description BLOOD RIGHT HAND   Final   Special Requests BOTTLES DRAWN AEROBIC ONLY [facility]   Final   Culture  Setup Time 10/13/2012 04:43   Final   Culture     Final   Value:        BLOOD CULTURE RECEIVED NO GROWTH TO DATE CULTURE WILL BE HELD FOR 5 DAYS BEFORE ISSUING A FINAL NEGATIVE REPORT   Report Status PENDING  Incomplete  CULTURE, BLOOD (ROUTINE X 2)     Status: None   Collection Time    10/16/12 10:20 AM      Result Value Range Status   Specimen Description BLOOD RIGHT HAND   Final   Special Requests BOTTLES DRAWN AEROBIC AND ANAEROBIC 5CC   Final   Culture  Setup Time 10/16/2012 13:12   Final   Culture     Final   Value:        BLOOD CULTURE RECEIVED NO GROWTH TO DATE CULTURE WILL BE HELD FOR 5 DAYS BEFORE ISSUING A FINAL NEGATIVE REPORT   Report Status PENDING   Incomplete  CULTURE, BLOOD (ROUTINE X 2)     Status: None   Collection Time    10/16/12 10:25 AM      Result Value Range Status   Specimen Description BLOOD LEFT ARM   Final   Special Requests BOTTLES DRAWN AEROBIC AND ANAEROBIC 5CC   Final   Culture  Setup Time 10/16/2012 13:12   Final   Culture     Final   Value:        BLOOD CULTURE RECEIVED NO GROWTH TO DATE CULTURE WILL BE HELD FOR 5 DAYS BEFORE ISSUING A FINAL NEGATIVE REPORT   Report Status PENDING   Incomplete    Studies/Results: Dg Chest 1 View  10/12/2012   *RADIOLOGY REPORT*  Clinical Data: Shortness of breath, sickle cell anemia, upper back/chest pain  CHEST - 1 VIEW  Comparison: 10/10/2012  Findings: Increased interstitial markings, favored to reflect mild interstitial edema.  Small bilateral pleural effusions.  Associated lower lobe opacities, likely atelectasis, pneumonia not excluded.  Cardiomegaly.  IMPRESSION: Cardiomegaly with mild interstitial edema and small bilateral pleural effusions.  Patchy bilateral lower lobe opacities, likely atelectasis, pneumonia not excluded.   Original Report  Authenticated By: Charline Bills, M.D.   Dg Chest 2 View  10/16/2012   *RADIOLOGY REPORT*  Clinical Data: Sickle cell crisis.  Left shoulder pain.  Shortness of breath.  Current history of hypertension.  Follow up pleural effusions and edema.  CHEST - 2 VIEW  Comparison: Portable chest x-rays 10/12/2012, 03/30/2012.  Two-view chest x-ray 10/10/2012, 03/29/2012.  Findings: Interval increase in the bilateral pleural effusions, right greater than left.  Worsening aeration in the lower lobes. Mild pulmonary venous hypertension without residual interstitial pulmonary edema.  Cardiac silhouette enlarged but stable.  Hilar and mediastinal contours otherwise unremarkable.  Visualized bony thorax intact.  IMPRESSION: Increasing bilateral pleural effusions, right greater than left, and associated passive atelectasis and/or pneumonia in the lower lobes.  Interval resolution of interstitial pulmonary edema.   Original Report Authenticated By: Hulan Saas, M.D.   Dg Chest 2 View  10/10/2012   *RADIOLOGY REPORT*  Clinical Data: Chest pain; history of sickle cell disease.  CHEST - 2 VIEW  Comparison: Chest radiograph from 03/30/2012  Findings: The lungs are well-aerated and clear.  There is no evidence of focal opacification, pleural effusion or pneumothorax. Pulmonary vascularity is at the upper limits of normal.  The heart is borderline enlarged.  No acute osseous abnormalities are seen.  IMPRESSION: Borderline cardiomegaly; no acute cardiopulmonary process seen.   Original Report Authenticated By: Tonia Ghent, M.D.   US Abdomen Complete  10/10/2012   *RADIOLOGY REPORT*  Clinical Data:  Cholelithiasis  COMPLETE ABDOMINAL ULTRASOUND  Comparison:  03/30/2012  Findings:  Gallbladder:  Multiple small gallstones.  Gallbladder wall thickening or pericholecystic fluid.  Negative sonographic Murphy's sign.  Common bile duct:  Measures  3 mm.  Liver:  No focal lesion identified.  Within normal limits in parenchymal  echogenicity.  IVC:  Appears normal.  Pancreas:  Incompletely visualized but grossly unremarkable.  Spleen:  Measures 5.1 cm.  Poorly visualized and mildly heterogeneous, likely related to history of sickle cell disease.  Right Kidney:  Measures 15.5 cm.  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  No hydronephrosis.  Left Kidney:  Measures 13.0 cm.  No mass or hydronephrosis.  Abdominal aorta:  No aneurysm identified.  IMPRESSION: Cholelithiasis, without associated findings to suggest acute cholecystitis.  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   Original Report Authenticated By: Charline Bills, M.D.   Medications: Scheduled Meds: . azithromycin  500 mg Intravenous Q24H  . enoxaparin (LOVENOX) injection  40 mg Subcutaneous Q24H  . folic acid  1 mg Oral Daily  . imipenem-cilastatin  500 mg Intravenous Q8H  . ketorolac  15 mg Intravenous Q8H  . oxyCODONE  5 mg Oral Q4H  . polyethylene glycol  17 g Oral Daily  . senna-docusate  1 tablet Oral BID  . sodium chloride  3 mL Intravenous Q12H  . vancomycin  1,000 mg Intravenous Q8H   Continuous Infusions:  PRN Meds:.acetaminophen, acetaminophen, diphenhydrAMINE, HYDROmorphone (DILAUDID) injection, ondansetron (ZOFRAN) IV, ondansetron   Angela Platner 10/18/2012, 7:34 AM

## 2012-10-19 LAB — CBC WITH DIFFERENTIAL/PLATELET
Basophils Relative: 1 % (ref 0–1)
Eosinophils Absolute: 1.6 10*3/uL — ABNORMAL HIGH (ref 0.0–0.7)
HCT: 20.4 % — ABNORMAL LOW (ref 36.0–46.0)
Hemoglobin: 7 g/dL — ABNORMAL LOW (ref 12.0–15.0)
Lymphocytes Relative: 12 % (ref 12–46)
Lymphs Abs: 2.1 10*3/uL (ref 0.7–4.0)
MCHC: 34.3 g/dL (ref 30.0–36.0)
MCV: 78.5 fL (ref 78.0–100.0)
Monocytes Relative: 12 % (ref 3–12)
Neutro Abs: 11.8 10*3/uL — ABNORMAL HIGH (ref 1.7–7.7)

## 2012-10-19 LAB — BASIC METABOLIC PANEL
BUN: 3 mg/dL — ABNORMAL LOW (ref 6–23)
Chloride: 99 mEq/L (ref 96–112)
Creatinine, Ser: 0.44 mg/dL — ABNORMAL LOW (ref 0.50–1.10)
Glucose, Bld: 94 mg/dL (ref 70–99)
Potassium: 3.9 mEq/L (ref 3.5–5.1)

## 2012-10-19 LAB — CULTURE, BLOOD (ROUTINE X 2): Culture: NO GROWTH

## 2012-10-19 MED ORDER — HYDROMORPHONE HCL PF 1 MG/ML IJ SOLN
1.0000 mg | INTRAMUSCULAR | Status: DC | PRN
  Administered 2012-10-19 – 2012-10-20 (×10): 1 mg via INTRAVENOUS
  Filled 2012-10-19 (×10): qty 1

## 2012-10-19 NOTE — Progress Notes (Signed)
Subjective: A 42 yo woman with sickle cell painful crisis, acute chest syndrome and hospital acquired pneumonia. She has been doing better and requiring less pain medication today compared t yesterday. Has been able to get out of bed without difficulty. Still has shortness of breath with activity but overall feels better. Has not tried activities without oxygen yet. No chest pain, no fever, no NVD.  Objective: Vital signs in last 24 hours: Temp:  [98.8 F (37.1 C)-99.8 F (37.7 C)] 98.9 F (37.2 C) (06/02 0621) Pulse Rate:  [72-87] 72 (06/02 0621) Resp:  [16-20] 16 (06/02 0621) BP: (95-117)/(59-75) 107/59 mmHg (06/02 0621) SpO2:  [94 %-100 %] 100 % (06/02 9147) Weight change:  Last BM Date: 10/17/12  Intake/Output from previous day: 06/01 0701 - 06/02 0700 In: 1400 [P.O.:600; IV Piggyback:800] Out: -  Intake/Output this shift:    General appearance: alert, cooperative and no distress Eyes: conjunctivae/corneas clear. PERRL, EOM's intact. Fundi benign. Resp: clear to auscultation bilaterally Chest wall: right sided chest wall tenderness, left sided chest wall tenderness Cardio: regular rate and rhythm, S1, S2 normal, no murmur, click, rub or gallop GI: soft, non-tender; bowel sounds normal; no masses,  no organomegaly Extremities: Has some lower extremity ulcers Pulses: 2+ and symmetric Skin: Skin color, texture, turgor normal. No rashes or lesions  Lab Results:  Recent Labs  10/18/12 0505 10/19/12 0500  WBC 15.6* 17.8*  HGB 7.0* 7.0*  HCT 21.1* 20.4*  PLT 391 474*   BMET  Recent Labs  10/18/12 0505 10/19/12 0500  NA 136 137  K 3.3* 3.9  CL 99 99  CO2 29 30  GLUCOSE 101* 94  BUN 5* 3*  CREATININE 0.44* 0.44*  CALCIUM 8.8 9.1    Studies/Results: Ct Chest W Contrast  10/18/2012   *RADIOLOGY REPORT*  Clinical Data: Sickle cell.  Pneumonia.  Pain.  CT CHEST WITH CONTRAST  Technique:  Multidetector CT imaging of the chest was performed following the standard  protocol during bolus administration of intravenous contrast.  Contrast: 80mL OMNIPAQUE IOHEXOL 300 MG/ML  SOLN  Comparison: Plain film of 2 days prior.  No prior CT.  Findings: Lungs/pleura: Bibasilar airspace disease.  Small bilateral pleural effusions, larger on the left than right.  Heart/Mediastinum: Small left supraclavicular nodes which are likely reactive. Normal aortic caliber without dissection.  Mild cardiomegaly.  No pericardial effusion. No central pulmonary embolism, on this non-dedicated study.  No mediastinal or hilar adenopathy.  Upper abdomen: Splenic flexure colon mildly prominent positioned immediately under the left hemidiaphragm.  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  Bones/Musculoskeletal:  Bilateral humeral head avascular necrosis.  IMPRESSION: 1.  Bibasilar airspace disease, most consistent with pneumonia. Adjacent small bilateral pleural effusions. 2.  Mild cardiomegaly.   Original Report Authenticated By: Jeronimo Greaves, M.D.    Medications: I have reviewed the patient's current medications.  Assessment/Plan: 42 yo with HCAP, Sickle cell painful crisis and acute chest syndrome now resolved.  #1 HCAP: Patient is afebrile, WBC slightly increased today but overall lower than 2 days ago. On Vancomycin and imipenem. Has been on Levaquin in the past. Will need 10 days of antibiotics. Patient has no insurance or means of getting IV antibiotics at home. She will likely need to be discharged on oral medication when ready. Cultures are not available to determine sensitivities for home treatment.  #2 Sickle Cell Painful Crisis: Much better control. Pain is at 5/10 today. I will deescalate the Dilaudid dose. Reduce  it to 1mg  Q3 hours.  #3 Sickle Cell Anemia: H/H now stable. Continue to monitor.  #4 Constipation: Has had 1 BM today.  LOS: 9 days   Ranyah Groeneveld,LAWAL 10/19/2012, 12:17 PM

## 2012-10-19 NOTE — Progress Notes (Signed)
NUTRITION FOLLOW UP  Intervention:   Discontinue Ensure Complete Encourage PO intake Provide Snacks BID in between meals  Nutrition Dx:   Unintended weight loss related to poor appetite/intake from sickle cell pain as evidenced by pt report; ongoing   Goal:   1. Pt to consume >90% of meals/supplements; not met 2. Weight maintenance; no new wt   Monitor:   Weight; no new wt Labs; low hemoglobin, low BUN, low creatinine Intake; poor 25-75% of most meals  Assessment:   Pt reports that she still has a poor appetite and has not been finishing any of her meals. Pt states that she has not been drinking the Ensure supplements because she drank 1 Ensure last week and it made her vomit. Pt denies any nausea/vomiting recently. Pt states she does not like food that is too sweet. Per nursing notes pt is eating 25% to 75% of only 2 meals daily.   Height: Ht Readings from Last 1 Encounters:  10/10/12 5\' 7"  (1.702 m)    Weight Status:   Wt Readings from Last 1 Encounters:  10/10/12 128 lb (58.06 kg)    Re-estimated needs:  Kcal: 1450-1750  Protein: 60-70g  Fluid: 1.4-1.7L/day  Skin: wound on ankle  Diet Order: General   Intake/Output Summary (Last 24 hours) at 10/19/12 1057 Last data filed at 10/19/12 0047  Gross per 24 hour  Intake   1160 ml  Output      0 ml  Net   1160 ml    Last BM: 5/31   Labs:   Recent Labs Lab 10/14/12 0423  10/17/12 0551 10/18/12 0505 10/19/12 0500  NA 136  < > 132* 136 137  K 3.4*  < > 3.4* 3.3* 3.9  CL 97  < > 94* 99 99  CO2 33*  < > 29 29 30   BUN 5*  < > 3* 5* 3*  CREATININE 0.35*  < > 0.36* 0.44* 0.44*  CALCIUM 8.6  < > 9.2 8.8 9.1  MG 2.1  --   --   --  2.0  GLUCOSE 96  < > 111* 101* 94  < > = values in this interval not displayed.  CBG (last 3)  No results found for this basename: GLUCAP,  in the last 72 hours  Scheduled Meds: . azithromycin  500 mg Intravenous Q24H  . enoxaparin (LOVENOX) injection  40 mg Subcutaneous  Q24H  . folic acid  1 mg Oral Daily  . imipenem-cilastatin  250 mg Intravenous Q6H  . ketorolac  15 mg Intravenous Q8H  . oxyCODONE  5 mg Oral Q4H  . polyethylene glycol  17 g Oral Daily  . senna-docusate  1 tablet Oral BID  . sodium chloride  3 mL Intravenous Q12H  . vancomycin  1,000 mg Intravenous Q8H    Continuous Infusions:   Ian Malkin RD, LDN Inpatient Clinical Dietitian Pager: [phone] After Hours Pager: [phone]

## 2012-10-20 LAB — HEMOGLOBIN AND HEMATOCRIT, BLOOD: Hemoglobin: 7.5 g/dL — ABNORMAL LOW (ref 12.0–15.0)

## 2012-10-20 LAB — CBC WITH DIFFERENTIAL/PLATELET
Basophils Relative: 1 % (ref 0–1)
Eosinophils Relative: 8 % — ABNORMAL HIGH (ref 0–5)
Hemoglobin: 6.3 g/dL — CL (ref 12.0–15.0)
Lymphs Abs: 3 10*3/uL (ref 0.7–4.0)
MCH: 27.3 pg (ref 26.0–34.0)
MCV: 77.9 fL — ABNORMAL LOW (ref 78.0–100.0)
Monocytes Absolute: 2.1 10*3/uL — ABNORMAL HIGH (ref 0.1–1.0)
Neutro Abs: 8.6 10*3/uL — ABNORMAL HIGH (ref 1.7–7.7)
RBC: 2.31 MIL/uL — ABNORMAL LOW (ref 3.87–5.11)

## 2012-10-20 LAB — COMPREHENSIVE METABOLIC PANEL
Albumin: 2.8 g/dL — ABNORMAL LOW (ref 3.5–5.2)
Alkaline Phosphatase: 126 U/L — ABNORMAL HIGH (ref 39–117)
BUN: 7 mg/dL (ref 6–23)
CO2: 28 mEq/L (ref 19–32)
Chloride: 99 mEq/L (ref 96–112)
Glucose, Bld: 85 mg/dL (ref 70–99)
Potassium: 3.6 mEq/L (ref 3.5–5.1)
Total Bilirubin: 1 mg/dL (ref 0.3–1.2)

## 2012-10-20 LAB — PREPARE RBC (CROSSMATCH)

## 2012-10-20 MED ORDER — FUROSEMIDE 10 MG/ML IJ SOLN
20.0000 mg | Freq: Once | INTRAMUSCULAR | Status: AC
  Administered 2012-10-20: 20 mg via INTRAVENOUS

## 2012-10-20 NOTE — Progress Notes (Signed)
CRITICAL VALUE ALERT  Critical value received:  hgb 6.3  Date of notification:  10/20/2012  Time of notification:  0500  Critical value read back:yes  Nurse who received alert:  mk  MD notified (1st page):  k shorr  Time of first page:  0520  MD notified (2nd page):  Time of second page:  Responding MD:  k shorr  Time MD responded:  [phone]

## 2012-10-20 NOTE — Progress Notes (Signed)
CARE MANAGEMENT NOTE 10/20/2012  Patient:  [name], [name]   Account Number:  [id number]  Date Initiated:  10/13/2012  Documentation initiated by:  [facility]  Subjective/Objective Assessment:   42 year old female admitted with SCC.     Action/Plan:   Frm home.   Anticipated DC Date:  10/23/2012   Anticipated DC Plan:  HOME/SELF CARE      DC Planning Services  CM consult      Choice offered to / List presented to:             Status of service:  In process, will continue to follow Medicare Important Message given?  NA - LOS <3 / Initial given by admissions (If response is "NO", the following Medicare IM given date fields will be blank) Date Medicare IM given:   Date Additional Medicare IM given:    Discharge Disposition:    Per UR Regulation:  Reviewed for med. necessity/level of care/duration of stay  If discussed at Long Length of Stay Meetings, dates discussed:    Comments:  [id number]/Raniyah Curenton Earlene Plater, RN, BSN, CCM:  CHART REVIEWED AND UPDATED.  Next chart review due on [id number]. NO DISCHARGE NEEDS PRESENT AT THIS TIME. CASE MANAGEMENT [phone]

## 2012-10-20 NOTE — Progress Notes (Signed)
Discussed in long length of stay rounds. 

## 2012-10-20 NOTE — Discharge Summary (Signed)
[name] MRN: [id number] DOB/AGE: [birth year] 42 y.o.  Admit date: 10/10/2012 Discharge date: 10/20/2012  Primary Care Physician:  No PCP Per Patient   Discharge Diagnoses:   Patient Active Problem List   Diagnosis Date Noted  . CAP (community acquired pneumonia) 10/13/2012  . Hb-Ss disease with acute chest syndrome 10/13/2012  . Chronic pain syndrome 10/13/2012  . Sickle cell pain crisis 10/10/2012  . Wound of lower extremity 10/10/2012  . Sinus bradycardia by electrocardiogram 04/03/2012  . 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 Chronic wound of extremity 04/01/2012  . Constipation 04/01/2012  . Sickle cell anemia with pain 03/30/2012  . Elevated LFTs 03/30/2012  . Abdominal  pain, other specified site 03/30/2012  . Nausea and vomiting in adult 03/30/2012  . Cholelithiasis 03/30/2012  . Anemia 03/30/2012  . Leukocytosis 03/30/2012  . Hypokalemia 03/30/2012  . Sickle cell disease     DISCHARGE MEDICATION:   Medication List    STOP taking these medications       acetaminophen 500 MG tablet  Commonly known as:  TYLENOL      TAKE these medications       folic acid 1 MG tablet  Commonly known as:  FOLVITE  Take 1 tablet (1 mg total) by mouth daily.     oxyCODONE 5 MG immediate release tablet  Commonly known as:  Oxy IR/ROXICODONE  Take 1 tablet (5 mg total) by mouth every 6 (six) hours as needed for pain.          Consults:     SIGNIFICANT DIAGNOSTIC STUDIES:  Dg Chest 1 View  10/12/2012   *RADIOLOGY REPORT*  Clinical Data: Shortness of breath, sickle cell anemia, upper back/chest pain  CHEST - 1 VIEW  Comparison: 10/10/2012  Findings: Increased interstitial markings, favored to reflect mild interstitial edema.  Small bilateral pleural effusions.  Associated lower lobe opacities, likely atelectasis, pneumonia not excluded.  Cardiomegaly.  IMPRESSION: Cardiomegaly with mild interstitial edema and small bilateral pleural effusions.  Patchy bilateral lower  lobe opacities, likely atelectasis, pneumonia not excluded.   Original Report Authenticated By: Charline Bills, M.D.   Dg Chest 2 View  10/16/2012   *RADIOLOGY REPORT*  Clinical Data: Sickle cell crisis.  Left shoulder pain.  Shortness of breath.  Current history of hypertension.  Follow up pleural effusions and edema.  CHEST - 2 VIEW  Comparison: Portable chest x-rays 10/12/2012, 03/30/2012.  Two-view chest x-ray 10/10/2012, 03/29/2012.  Findings: Interval increase in the bilateral pleural effusions, right greater than left.  Worsening aeration in the lower lobes. Mild pulmonary venous hypertension without residual interstitial pulmonary edema.  Cardiac silhouette enlarged but stable.  Hilar and mediastinal contours otherwise unremarkable.  Visualized bony thorax intact.  IMPRESSION: Increasing bilateral pleural effusions, right greater than left, and associated passive atelectasis and/or pneumonia in the lower lobes.  Interval resolution of interstitial pulmonary edema.   Original Report Authenticated By: Hulan Saas, M.D.   Dg Chest 2 View  10/10/2012   *RADIOLOGY REPORT*  Clinical Data: Chest pain; history of sickle cell disease.  CHEST - 2 VIEW  Comparison: Chest radiograph from 03/30/2012  Findings: The lungs are well-aerated and clear.  There is no evidence of focal opacification, pleural effusion or pneumothorax. Pulmonary vascularity is at the upper limits of normal.  The heart is borderline enlarged.  No acute osseous abnormalities are seen.  IMPRESSION: Borderline cardiomegaly; no acute cardiopulmonary process seen.   Original Report Authenticated By: Tonia Ghent, M.D.   Ct Chest W Contrast  10/18/2012   *RADIOLOGY REPORT*  Clinical Data: Sickle cell.  Pneumonia.  Pain.  CT CHEST WITH CONTRAST  Technique:  Multidetector CT imaging of the chest was performed following the standard protocol during bolus administration of intravenous contrast.  Contrast: 80mL OMNIPAQUE IOHEXOL 300 MG/ML  SOLN   Comparison: Plain film of 2 days prior.  No prior CT.  Findings: Lungs/pleura: Bibasilar airspace disease.  Small bilateral pleural effusions, larger on the left than right.  Heart/Mediastinum: Small left supraclavicular nodes which are likely reactive. Normal aortic caliber without dissection.  Mild cardiomegaly.  No pericardial effusion. No central pulmonary embolism, on this non-dedicated study.  No mediastinal or hilar adenopathy.  Upper abdomen: Splenic flexure colon mildly prominent positioned immediately under the left hemidiaphragm.  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  Bones/Musculoskeletal:  Bilateral humeral head avascular necrosis.  IMPRESSION: 1.  Bibasilar airspace disease, most consistent with pneumonia. Adjacent small bilateral pleural effusions. 2.  Mild cardiomegaly.   Original Report Authenticated By: Jeronimo Greaves, M.D.   US Abdomen Complete  10/10/2012   *RADIOLOGY REPORT*  Clinical Data:  Cholelithiasis  COMPLETE ABDOMINAL ULTRASOUND  Comparison:  03/30/2012  Findings:  Gallbladder:  Multiple small gallstones.  Gallbladder wall thickening or pericholecystic fluid.  Negative sonographic Murphy's sign.  Common bile duct:  Measures 3 mm.  Liver:  No focal lesion identified.  Within normal limits in parenchymal echogenicity.  IVC:  Appears normal.  Pancreas:  Incompletely visualized but grossly unremarkable.  Spleen:  Measures 5.1 cm.  Poorly visualized and mildly heterogeneous, likely related to history of sickle cell disease.  Right Kidney:  Measures 15.5 cm.  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  No hydronephrosis.  Left Kidney:  Measures 13.0 cm.  No mass or hydronephrosis.  Abdominal aorta:  No aneurysm identified.  IMPRESSION: Cholelithiasis, without associated findings to suggest acute cholecystitis.  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   Original Report Authenticated By: Charline Bills, M.D.      Recent Results (from the past 240 hour(s))  CULTURE, BLOOD (ROUTINE X 2)     Status: None   Collection Time    10/16/12 10:20 AM      Result Value Range Status   Specimen Description BLOOD RIGHT HAND   Final   Special Requests BOTTLES DRAWN AEROBIC AND ANAEROBIC 5CC   Final   Culture  Setup Time 10/16/2012 13:12   Final   Culture     Final   Value:        BLOOD CULTURE RECEIVED NO GROWTH TO DATE CULTURE WILL BE HELD FOR 5 DAYS BEFORE ISSUING A FINAL NEGATIVE REPORT   Report Status PENDING   Incomplete  CULTURE, BLOOD (ROUTINE X 2)     Status: None   Collection Time    10/16/12 10:25 AM      Result Value Range Status   Specimen Description BLOOD LEFT ARM   Final   Special Requests BOTTLES DRAWN AEROBIC AND ANAEROBIC 5CC   Final   Culture  Setup Time 10/16/2012 13:12   Final   Culture     Final   Value:        BLOOD CULTURE RECEIVED NO GROWTH TO DATE CULTURE WILL BE HELD FOR 5 DAYS BEFORE ISSUING A FINAL NEGATIVE REPORT   Report Status PENDING   Incomplete    BRIEF ADMITTING  H & P: Vonceil Upshur is a 42 y.o. female with known history of sickle cell disease started experiencing generalized body ache since morning yesterday. Denies any associated fever chills headache focal deficits shortness of breath productive cough. In the ER patient was given multiple dose of pain medications despite which patient still has pain and has been admitted for sickle cell pain crisis. Patient's hemoglobin is around 7 and patient states it's usually stays around the same number. She does not follow with any PCP for here. Patient's lab show significant leukocytosis but patient is afebrile chest x-ray done remarkable patient denies any nausea vomiting or diarrhea. Patient does have history of cholelithiasis but at this time patient abdomen appears benign. Patient had come to the ER few days ago  for right lower extremity wound. Patient has chronic wound of the right lower extremity on the medial aspect and has started developing on the lateral aspect now. Patient was prescribed prednisone and antibiotics and antifungal cream and was instructed to follow as outpatient. The wound does not have any active discharge at this time.    Hospital Course:  Present on Admission:  . Sickle cell pain crisis: This is a patient who is known to me only peripherally from previous hospitalization. The patient's hemoglobin SS however has no insurance or means to cure her disease. She normally has a baseline hemoglobin around 5.5-6 beers presents to the hospital in acute vaso-occlusive crisis. She was treated with IV analgesics on a scheduled bolus dosing. The patient is essentially opiate nave and was treated with small amounts of Dilaudid 1-2 mg every 2 hours. She was then transitioned to oral medications and is being discharged home on oxycodone 5 mg to be used every 4 hours as needed for pain. Please note that the patient had been using Tylenol in the dosing of 6 g per day to control her pain. The specific risks of hepatic disease) she has been counseled to keep it however those and no more than 3 g today. And medications including Tylenol within them should be avoided in this patient.  . Sickle cell disease: The patient has sickle cell disease and currently is on no medications for her disease process. We have placed her on folic acid and she's to followup in the clinic for further evaluation of her disease and associated conditions and to develop therapeutic plan for disease management. The patient was anemic during his hospitalization which is likely secondary to hemolysis. She received a total of 4 units of packed blood cells. Her hemoglobin was evaluated post transfusion today and will be followed up on in the office.  . Acute chest syndrome: the patient presented with new infiltrates in her lungs and  hypoxemia consistent with acute chest syndrome. This is felt to be secondary to community acquired pneumonia. The patient was initially treated with Levaquin I will continue to have fevers and oxygen requirement. Her spectrum was broadened to include imipenem, vancomycin and azithromycin. The patient received a total of 9 days of antibiotics. The patient is being discharged home without any antibiotics as she has completed a full course of treatment for pneumonia. At the time of discharge the patient is saturating between 99-100% on room air.  Marland Kitchen CAP (community acquired pneumonia): As noted above the patient developed a pneumonia and was treated with Levaquin and then a broad-spectrum antibiotics. Her condition is stable without oxygen requirement at the time of discharge.  . Leukocytosis: Secondary to the pneumonia and acute vaso-occlusive episode. Resulted the  time of discharge.  . Nausea and vomiting: The patient in one to 2 days of nausea and vomiting which was self-limited. Etiology was unclear. The patient does have a history of gallstones who had no abdominal pain. At the time of discharge was tolerating her diet without any problem.  . Constipation: Patient last bowel movement was approximately 5 days ago. The patient was taking MiraLax and was offered a laxative which she refused. The patient states that this is normal for her to be constipated during hospitalization however she is able to have bowel movements when she returns home. Given the patient's refusal of further therapy I will not pursue this any further. The patient is tolerating her diet without any difficulty.  . Cholelithiasis: The patient does have a history of gallstones and a previous hospitalization was advised to followup with surgery to have call bladder removal. The patient did not pursue this and this remains a risk for infection for this patient with sickle cell disease. Presently the patient has no symptoms and there is no  necessity for acute intervention. The patient should have this addressed on outpatient basis with her primary care physician.   . Chronic leg wound: The patient has a chronic wound on the right leg. During his hospitalization was treated with an occlusive dressing after being evaluated and recommendations made by wound ostomy care nursing. I suspect this was likely related to her sickle cell anemia and will likely have poor healing until oxygenation is improved.  Disposition and Follow-up:  Patient is discharged in stable condition. Her condition is good at the time of discharge I've advised her to follow up with an appointment to see me in the outpatient setting she has no primary care physician at this time.      Discharge Orders   Future Appointments Provider Department Dept Phone   10/21/2012 1:00 PM [facility] [phone]   Future Orders Complete By Expires     Activity as tolerated - No restrictions  As directed     Diet general  As directed        DISCHARGE EXAM:  General: Alert, awake, oriented x3, in no acute distress. Patient appears chronically ill.  Vital signs: BP 104/68,HR 89, T 98.9 F (37.2 C), temperature source Oral, RR 16, height 5\' 7"  (1.702 m), weight 128 lb (58.06 kg), last menstrual period 10/01/2012, SpO2 97.00%. HEENT: [place]/AT PEERL, EOMI, mild icterus  Neck: Trachea midline, no masses, no thyromegal,y no JVD, no carotid bruit  OROPHARYNX: Moist, No exudate/ erythema/lesions.  Heart: Regular rate and rhythm, mild tachycardia, without murmurs, rubs, gallops.  Lungs: Clear to auscultation, no wheezing or rhonchi noted.  Abdomen: Soft, nontender, nondistended, positive bowel sounds, no masses.  Neuro: No focal neurological deficits noted. Strength functional in bilateral upper and lower extremities.  Musculoskeletal: No warm swelling or erythema around joints, no spinal tenderness noted.  Psychiatric: Patient alert  and oriented x3, good insight and cognition, good recent to remote recall. Appears happy to be going home    Recent Labs  10/19/12 0500 10/20/12 0405  NA 137 136  K 3.9 3.6  CL 99 99  CO2 30 28  GLUCOSE 94 85  BUN 3* 7  CREATININE 0.44* 0.47*  CALCIUM 9.1 8.9  MG 2.0  --     Recent Labs  10/20/12 0405  AST 24  ALT 24  ALKPHOS 126*  BILITOT 1.0  PROT 6.7  ALBUMIN 2.8*  No results found for this basename: LIPASE, AMYLASE,  in the last 72 hours  Recent Labs  10/19/12 0500 10/20/12 0405  WBC 17.8* 15.1*  NEUTROABS 11.8* 8.6*  HGB 7.0* 6.3*  HCT 20.4* 18.0*  MCV 78.5 77.9*  PLT 474* 527*   Total time for discharge process including decision-making face-to-face interactions greater than 30 minute  Signed: Devora Tortorella A. 10/20/2012, 5:33 PM

## 2012-10-21 ENCOUNTER — Encounter (HOSPITAL_BASED_OUTPATIENT_CLINIC_OR_DEPARTMENT_OTHER): Payer: Medicaid Other | Attending: General Surgery

## 2012-10-21 DIAGNOSIS — I872 Venous insufficiency (chronic) (peripheral): Secondary | ICD-10-CM | POA: Insufficient documentation

## 2012-10-21 DIAGNOSIS — D571 Sickle-cell disease 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: Secondary | ICD-10-CM | POA: Insufficient documentation

## 2012-10-21 DIAGNOSIS — L97309 Non-pressure chronic ulcer of unspecified ankle with unspecified severity: Secondary | ICD-10-CM | POA: Insufficient documentation

## 2012-10-21 LAB — TYPE AND SCREEN
ABO/RH(D): B POS
Antibody Screen: NEGATIVE
Unit division: 0

## 2012-10-22 ENCOUNTER — Telehealth (HOSPITAL_COMMUNITY): Payer: Self-pay

## 2012-10-22 LAB — CULTURE, BLOOD (ROUTINE X 2): Culture: NO GROWTH

## 2012-10-22 NOTE — Telephone Encounter (Signed)
This CM left voicemail message for patient to return call.  Re: previous pcp info & possilbe Medicaid in [place], RN, BSN, [place]  [phone]

## 2012-10-22 NOTE — Progress Notes (Signed)
[facility]  NAME:  MARGEAN, [name] NO.:  [id number]  MEDICAL RECORD NO.:  [id number]      DATE OF BIRTH:  Nov 16, 1970  PHYSICIAN:  Maxwell Caul, M.D. VISIT DATE:  10/21/2012                                  OFFICE VISIT   LOCATION:  [facility].  Ms. Beirne is a 42 year old woman who tells me that she has had problems with chronic lower extremity wound on the left just distal to her lateral malleolus.  This has been there for at least 3 months.  She tells me the year ago, she had a similar wound on the medial side of the same ankle that eventually closed with gentle cleansing, topical creams and wrapping.  She appears to have done this on her own.  She has a history of sickle cell disease recently in the hospital with a sickle cell crisis.  Hemoglobin 1 day ago was 6.3.  MEDICAL HISTORY:  Sickle cell anemia.  PAST SURGICAL HISTORY:  Cesarean section.  MEDICATION LIST:  Reviewed.  She is currently on Septra DS 1 p.o. b.i.d., prednisone 20 mg daily, oxycodone 5 mg p.o. q.4, ketoconazole cream 2 times a day, folate acid 1 mg daily.  PHYSICAL EXAMINATION:  Temperature 98, pulse 90, blood pressure 122/77. Peripheral pulses were palpable.  The area in question is on her right lateral ankle just distal to the lateral malleolus.  This measured 0.7 x 1.2 x 0.1, covered by a very tight adherent eschar.  I attempted debridement of this using a #10 blade, however, she tolerated this poorly.  IMPRESSIONS:  Right lateral ankle ulcer, significant stasis physiology surrounding this, but no evidence of infection.  We dressed this with an application of Santyl, Medihoney, covered in a foam dressing.  We will leave this in place for 1 week.  We instructed to keep this area dry. Further debridement is likely to be necessary here.          ______________________________ Maxwell Caul, M.D.     MGR/MEDQ  D:  10/21/2012   T:  10/22/2012  Job:  [id number]

## 2012-10-23 ENCOUNTER — Telehealth (HOSPITAL_COMMUNITY): Payer: Self-pay

## 2012-10-23 NOTE — Telephone Encounter (Signed)
This CM called with no answer. This CM received packet however new patient packet was not completed therefore this CM is unable obtain all patient records.    Karoline Caldwell RN,BSN, BS [phone]

## 2012-10-26 NOTE — Progress Notes (Signed)
Case Management Note: This CM spoke with [name] to determine which providers she has seen in the past due to her new patient paperwork was not completed when this CM received. Ms.Duby stated she has been seen at [facility]. Also this CM needs to see patient to complete [facility] application.   This CM spoke with [name] in financial counseling who stated a letter was sent to Ms.[name] advising to contact Ms.[name] to schedule an appointment with case worker to complete Medicaid application.    This CM called Ms.[name] back to give her Ms. [name]'s phone number in financial counseling, Ms.[name] stated she will call.     Karoline Caldwell, RN, BSN, [place] [phone]

## 2012-10-27 ENCOUNTER — Encounter: Payer: Self-pay | Admitting: Internal Medicine

## 2012-10-27 ENCOUNTER — Ambulatory Visit (HOSPITAL_COMMUNITY)
Admission: AD | Admit: 2012-10-27 | Discharge: 2012-10-27 | Disposition: A | Discharge status: Home / Self Care | Payer: PRIVATE HEALTH INSURANCE | Source: Ambulatory Visit | Attending: Internal Medicine | Admitting: Internal Medicine

## 2012-10-27 ENCOUNTER — Ambulatory Visit (INDEPENDENT_AMBULATORY_CARE_PROVIDER_SITE_OTHER): Payer: Medicaid - Out of State | Admitting: Internal Medicine

## 2012-10-27 VITALS — BP 124/82 | HR 85 | Temp 98.4°F | Ht 67.0 in | Wt 117.0 lb

## 2012-10-27 DIAGNOSIS — L97909 Non-pressure chronic ulcer of unspecified part of unspecified lower leg with unspecified severity: Secondary | ICD-10-CM | POA: Insufficient documentation

## 2012-10-27 DIAGNOSIS — M2550 Pain in unspecified joint: Secondary | ICD-10-CM | POA: Insufficient documentation

## 2012-10-27 DIAGNOSIS — D5700 Hb-SS disease with crisis, unspecified: Secondary | ICD-10-CM | POA: Insufficient documentation

## 2012-10-27 HISTORY — DX: Non-pressure chronic ulcer of unspecified part of unspecified lower leg with unspecified severity: L97.909

## 2012-10-27 LAB — CBC WITH DIFFERENTIAL/PLATELET
Basophils Relative: 1 % (ref 0–1)
Eosinophils Absolute: 1.2 10*3/uL — ABNORMAL HIGH (ref 0.0–0.7)
HCT: 25.1 % — ABNORMAL LOW (ref 36.0–46.0)
Hemoglobin: 8.5 g/dL — ABNORMAL LOW (ref 12.0–15.0)
MCH: 26.9 pg (ref 26.0–34.0)
MCHC: 33.9 g/dL (ref 30.0–36.0)
Monocytes Absolute: 1.6 10*3/uL — ABNORMAL HIGH (ref 0.1–1.0)
Monocytes Relative: 10 % (ref 3–12)
Neutrophils Relative %: 62 % (ref 43–77)

## 2012-10-27 LAB — RETICULOCYTES
RBC.: 3.16 MIL/uL — ABNORMAL LOW (ref 3.87–5.11)
Retic Count, Absolute: 126.4 10*3/uL (ref 19.0–186.0)
Retic Ct Pct: 4 % — ABNORMAL HIGH (ref 0.4–3.1)

## 2012-10-27 LAB — COMPREHENSIVE METABOLIC PANEL
ALT: 18 U/L (ref 0–35)
AST: 22 U/L (ref 0–37)
Calcium: 9.5 mg/dL (ref 8.4–10.5)
Sodium: 134 mEq/L — ABNORMAL LOW (ref 135–145)
Total Protein: 9 g/dL — ABNORMAL HIGH (ref 6.0–8.3)

## 2012-10-27 MED ORDER — OXYCODONE HCL 5 MG PO TABS: 5.0000 mg | ORAL_TABLET | ORAL | Status: DC | PRN

## 2012-10-27 NOTE — Procedures (Signed)
[facility]  Procedure Note  [name] RUE:[id number] DOB: 1970/12/24 DOA: 10/27/2012   PCP: No PCP Per Patient   Associated Diagnosis: Sickle Cell  Procedure Note: labs, peripheral blood draw to right upper extremity.    Condition During Procedure: Patient tolerated well   Condition at Discharge:Patient tolerated well, no complaints.    Allyn Kenner, RN  [facility]

## 2012-10-27 NOTE — Progress Notes (Signed)
Patient: [name] DOB :1970/06/22 MRN :[id number]  Date: 10/27/2012  Documentation Initiated by : Jefm Miles  Subjective/Objective Assessment: [name] is a 42 year old female with Hb SS/SCD. [name] is here for a follow up appointment from previous hospital stay 10/15/12. [name] had out of state Medicaid in [place] however since moving here she does not have any insurance coverage. [name] has a 75 year old daughter that resides with her.     Barriers to Care: Needs PCP and medical insurance coverage Prior Approval (PA) #: NA PA start date: NA PA end date: NA   Action/Plan: This CM assisted [name] with completing [employer] application(Purchase of Medical Care Services) and this CM coordinated with patient DSS/financial counselors contact information to initiate [place] Medicaid application. [name] has an appointment with DSS counselor on Friday 10/30/2012. [name] has completed a [facility] potential/new patient application packet .This CM provided this CM's contact information if she has any questions or concerns.    Comments: [name] does not have any questions or concerns at this time.  Time spent: 45 mins.  Karoline Caldwell ,RN, BSN, [place]   [phone]

## 2012-10-28 ENCOUNTER — Other Ambulatory Visit: Payer: Self-pay | Admitting: Internal Medicine

## 2012-10-28 ENCOUNTER — Non-Acute Institutional Stay (HOSPITAL_COMMUNITY)
Admission: AD | Admit: 2012-10-28 | Discharge: 2012-10-28 | Disposition: A | Discharge status: Home / Self Care | Payer: Medicaid Other | Source: Ambulatory Visit | Attending: Internal Medicine | Admitting: Internal Medicine

## 2012-10-28 DIAGNOSIS — D473 Essential (hemorrhagic) thrombocythemia: Secondary | ICD-10-CM

## 2012-10-28 DIAGNOSIS — E876 Hypokalemia: Secondary | ICD-10-CM | POA: Insufficient documentation

## 2012-10-28 LAB — BASIC METABOLIC PANEL
CO2: 26 mEq/L (ref 19–32)
Chloride: 98 mEq/L (ref 96–112)
Glucose, Bld: 110 mg/dL — ABNORMAL HIGH (ref 70–99)
Potassium: 3.3 mEq/L — ABNORMAL LOW (ref 3.5–5.1)
Sodium: 137 mEq/L (ref 135–145)

## 2012-10-28 LAB — CBC WITH DIFFERENTIAL/PLATELET
Basophils Relative: 1 % (ref 0–1)
Eosinophils Relative: 8 % — ABNORMAL HIGH (ref 0–5)
HCT: 25.1 % — ABNORMAL LOW (ref 36.0–46.0)
Hemoglobin: 8.5 g/dL — ABNORMAL LOW (ref 12.0–15.0)
Lymphs Abs: 2.9 10*3/uL (ref 0.7–4.0)
MCV: 79.4 fL (ref 78.0–100.0)
Monocytes Relative: 12 % (ref 3–12)
Neutro Abs: 10.4 10*3/uL — ABNORMAL HIGH (ref 1.7–7.7)
Platelets: 1308 10*3/uL (ref 150–400)
RBC: 3.16 MIL/uL — ABNORMAL LOW (ref 3.87–5.11)
WBC: 16.8 10*3/uL — ABNORMAL HIGH (ref 4.0–10.5)

## 2012-10-28 LAB — MAGNESIUM: Magnesium: 2 mg/dL (ref 1.5–2.5)

## 2012-10-28 MED ORDER — POTASSIUM CHLORIDE ER 10 MEQ PO TBCR: 10.0000 meq | EXTENDED_RELEASE_TABLET | Freq: Two times a day (BID) | ORAL | Status: DC

## 2012-10-28 MED ORDER — POTASSIUM CHLORIDE CRYS ER 20 MEQ PO TBCR
40.0000 meq | EXTENDED_RELEASE_TABLET | Freq: Once | ORAL | Status: AC
  Administered 2012-10-28: 40 meq via ORAL
  Filled 2012-10-28: qty 2

## 2012-10-28 MED ORDER — POTASSIUM CHLORIDE 10 MEQ/100ML IV SOLN
10.0000 meq | INTRAVENOUS | Status: DC
  Filled 2012-10-28 (×3): qty 100

## 2012-10-28 NOTE — Progress Notes (Signed)
  Subjective:    Patient ID: [name], female    DOB: 1970-07-20, 42 y.o.   MRN: [id number]  HPI: Pt with Hb SS who has had no chronic care in several years here for visit to establish care and for post-hospital visit. PT states that her pain is mostly in legs and back and is at a 5/10 baseline but controlled on current oxycodone. The patient has no other symptoms. She does have a leg ulcer and is being seen by wound clinic. She has not been on Hydrea for several years. Does not remember the effectiveness of Hydrea when taken last.    Review of Systems  Constitutional: Negative.   HENT: Negative.   Eyes: Negative.   Respiratory: Negative.   Cardiovascular: Negative.   Gastrointestinal: Negative.   Endocrine: Negative.   Genitourinary: Negative.   Musculoskeletal: Positive for myalgias and arthralgias.  Skin: Positive for wound (right leg).  Allergic/Immunologic: Negative.   Neurological: Negative.   Hematological: Negative.   Psychiatric/Behavioral: Negative.        Objective:   Physical Exam  Constitutional: She is oriented to person, place, and time. She appears well-developed and well-nourished.  HENT:  Head: Normocephalic and atraumatic.  Mouth/Throat: No oropharyngeal exudate.  Eyes: Conjunctivae and EOM are normal. Pupils are equal, round, and reactive to light. No scleral icterus.  Neck: Normal range of motion. Neck supple.  Cardiovascular: Normal rate and regular rhythm.  Exam reveals no gallop and no friction rub.   No murmur heard. Pulmonary/Chest: Effort normal and breath sounds normal.  Abdominal: Soft. Bowel sounds are normal.  Musculoskeletal: Normal range of motion.  Neurological: She is alert and oriented to person, place, and time. No cranial nerve deficit.  Skin: Skin is warm and dry.  Psychiatric: She has a normal mood and affect. Her behavior is normal. Judgment and thought content normal.          Assessment & Plan:  1. Hb SS: The patient is  currently not on any disease modifying medications. We'll obtain CBC with differential, CMET, LDH, reticulocyte, ferritin and electrophoresis today to establish baseline. I will not start any hydroxyurea today onto her indices have been evaluated. Patient will continue a Foley catheter for prophylaxis against bone marrow place. She will need an ophthalmologic examination that she's not had one for more than 5 years. She also needed 3 echocardiogram to evaluate for pulmonary hypertension particularly given that she's had no significant treatment and has a very low baseline hemoglobin SS a change from information during her hospitalization.  2. chronic pain: The patient is currently using oxycodone on an as needed basis which appears to be controlling her pain adequately to allow function. I will not change her medications today  3. Health maintenance the patient has had no health maintenance she has been without any access to healthcare. I will review her laboratory data and on her next visit we will discuss her health maintenance issues including mammogram, Pap smear etc.  Time spent for visit 30 minutes. Greater than 50% of time spent on counseling and discussion of chronic management of sickle cell disease and pain management.

## 2012-10-28 NOTE — Progress Notes (Addendum)
Patient ID: [name], female   DOB: 1971-04-15, 42 y.o.   MRN: [id number] Reviewed patient's labs which showed elevated platelets adn potassium of 2.8. Pt returned for repeat labs and platelets still elevated. However potassium improved to 3.3 and Mg in normal range at 2.0. Will treat with oral potassium. Prescription for supplemental potassium sent to Pharmacy. Will follow up thrombocytosis in office. My suspicion is that the patient is having a reactive thrombosis after debridement of wound.

## 2012-10-28 NOTE — Procedures (Signed)
[facility]  Procedure Note  [name] ONG:[id number] DOB: Oct 18, 1970 DOA: 10/28/2012   PCP: [name],[name], MD   Associated Diagnosis: Sickle cell SS  Procedure Note: Patient in to have labs drawn per order   Condition During Procedure: patient tolerated lab draw without any difficulty of complaint   Condition at Discharge: patient in NAD at time of discharge.   [name], RN  [facility]

## 2012-10-29 LAB — HEMOGLOBINOPATHY EVALUATION: Hemoglobin Other: 0 %

## 2012-10-30 ENCOUNTER — Other Ambulatory Visit: Payer: Self-pay | Admitting: *Deleted

## 2012-10-30 ENCOUNTER — Other Ambulatory Visit: Payer: Self-pay | Admitting: Internal Medicine

## 2012-10-30 DIAGNOSIS — E876 Hypokalemia: Secondary | ICD-10-CM

## 2012-10-30 LAB — BASIC METABOLIC PANEL
BUN: 5 mg/dL — ABNORMAL LOW (ref 6–23)
Calcium: 9.6 mg/dL (ref 8.4–10.5)
Glucose, Bld: 97 mg/dL (ref 70–99)
Potassium: 4.5 mEq/L (ref 3.5–5.3)
Sodium: 138 mEq/L (ref 135–145)

## 2012-11-10 ENCOUNTER — Ambulatory Visit (HOSPITAL_COMMUNITY)
Admission: AD | Admit: 2012-11-10 | Discharge: 2012-11-10 | Disposition: A | Discharge status: Home / Self Care | Payer: PRIVATE HEALTH INSURANCE | Source: Ambulatory Visit | Attending: Internal Medicine | Admitting: Internal Medicine

## 2012-11-10 ENCOUNTER — Ambulatory Visit (INDEPENDENT_AMBULATORY_CARE_PROVIDER_SITE_OTHER): Payer: Medicaid Other | Admitting: Internal Medicine

## 2012-11-10 ENCOUNTER — Encounter: Payer: Self-pay | Admitting: *Deleted

## 2012-11-10 VITALS — BP 117/80 | HR 76 | Temp 98.2°F | Wt 120.0 lb

## 2012-11-10 DIAGNOSIS — L97309 Non-pressure chronic ulcer of unspecified ankle with unspecified severity: Secondary | ICD-10-CM | POA: Insufficient documentation

## 2012-11-10 DIAGNOSIS — R5381 Other malaise: Secondary | ICD-10-CM

## 2012-11-10 DIAGNOSIS — R799 Abnormal finding of blood chemistry, unspecified: Secondary | ICD-10-CM

## 2012-11-10 DIAGNOSIS — D571 Sickle-cell disease 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: Secondary | ICD-10-CM

## 2012-11-10 DIAGNOSIS — R5383 Other fatigue: Secondary | ICD-10-CM

## 2012-11-10 DIAGNOSIS — R7989 Other specified abnormal findings of blood chemistry: Secondary | ICD-10-CM

## 2012-11-10 LAB — COMPREHENSIVE METABOLIC PANEL
ALT: 16 U/L (ref 0–35)
AST: 27 U/L (ref 0–37)
Alkaline Phosphatase: 100 U/L (ref 39–117)
CO2: 26 mEq/L (ref 19–32)
Chloride: 102 mEq/L (ref 96–112)
GFR calc Af Amer: >90 mL/min (ref >90)
GFR calc non Af Amer: >90 mL/min (ref >90)
Glucose, Bld: 79 mg/dL (ref 70–99)
Sodium: 138 mEq/L (ref 135–145)
Total Bilirubin: 2.1 mg/dL — ABNORMAL HIGH (ref 0.3–1.2)

## 2012-11-10 LAB — CBC WITH DIFFERENTIAL/PLATELET
Basophils Absolute: 0.2 10*3/uL — ABNORMAL HIGH (ref 0.0–0.1)
Eosinophils Relative: 4 % (ref 0–5)
HCT: 27.7 % — ABNORMAL LOW (ref 36.0–46.0)
Lymphocytes Relative: 28 % (ref 12–46)
Lymphs Abs: 3.1 10*3/uL (ref 0.7–4.0)
MCV: 77.6 fL — ABNORMAL LOW (ref 78.0–100.0)
Neutro Abs: 6.2 10*3/uL (ref 1.7–7.7)
Platelets: 631 10*3/uL — ABNORMAL HIGH (ref 150–400)
RBC: 3.57 MIL/uL — ABNORMAL LOW (ref 3.87–5.11)
RDW: 22.2 % — ABNORMAL HIGH (ref 11.5–15.5)
WBC: 11 10*3/uL — ABNORMAL HIGH (ref 4.0–10.5)

## 2012-11-10 MED ORDER — HYDROXYUREA 500 MG PO CAPS: 1000.0000 mg | ORAL_CAPSULE | Freq: Every day | ORAL | Status: DC

## 2012-11-10 MED ORDER — FOLIC ACID 1 MG PO TABS: 1.0000 mg | ORAL_TABLET | Freq: Every day | ORAL | Status: DC

## 2012-11-10 NOTE — Procedures (Signed)
[facility]  Procedure Note  [name] ZOX:[id number] DOB: 1971/05/07 DOA: 11/10/2012   PCP: [name],[name], MD   Associated Diagnosis: Sickle Cell Disease  Procedure Note: labs drawn per order   Condition During Procedure:tolerated well without any complaints   Condition at Discharge:Stable   [name], RN  [facility]

## 2012-11-10 NOTE — Progress Notes (Signed)
Subjective:    Patient ID: [name], female    DOB: 11/28/1970, 42 y.o.   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 She states that since her last office visit she's had very little pain. She's only needs to take her pain medicines when she has been going to the sickle cell center for debridement and treatment of her wounds. She has been taking her folic acid and endorse compliance with that.  The patient also states that she's been feeling fatigued which has been occurring for some time. She denies constipation or diarrhea, heat or cold intolerance, hair loss, weight loss or weight gain.     Review of Systems  Constitutional: Negative.   HENT: Negative.   Eyes: Negative.   Respiratory: Negative.   Cardiovascular: Negative.   Gastrointestinal: Negative.   Endocrine: Negative.   Genitourinary: Negative.   Musculoskeletal: Negative.  Negative for myalgias.  Skin: Positive for wound (Patient states will not improve then. On the care of wound center.).  Allergic/Immunologic: Negative.   Neurological: Negative.   Hematological: Negative.   Psychiatric/Behavioral: Negative.        Objective:   Physical Exam  Constitutional: She is oriented to person, place, and time. She appears well-developed and well-nourished.  HENT:  Head: Normocephalic and atraumatic.  Eyes: Conjunctivae and EOM are normal. Pupils are equal, round, and reactive to light. No scleral icterus.  Neck: Normal range of motion. Neck supple. No JVD present. No thyromegaly present.  Cardiovascular: Normal rate and regular rhythm.  Exam reveals no gallop and no friction rub.   No murmur heard. Pulmonary/Chest: Effort normal and breath sounds normal. She has no wheezes. She has no rales.  Abdominal: Soft. Bowel sounds are normal. She exhibits no distension and no mass. There is no tenderness.  Musculoskeletal: Normal range of motion.  Lymphadenopathy:    She has no cervical  adenopathy.  Neurological: She is alert and oriented to person, place, and time. No cranial nerve deficit.  Skin: Skin is warm and dry.  Psychiatric: She has a normal mood and affect. Her behavior is normal. Judgment and thought content normal.          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 I have reviewed his last patient and based upon this I will proceed with starting hydroxyurea on today. I will start her on 1000 mg which is approximately 18 mg per kilogram. The patient will continue on folic acid. She also needs to have a 2-D echocardiogram as well as a vision examination referral.   With regard to her symptom management the patient is on oxycodone 5 mg by mouth every 4 hours when necessary. She states she has had very little need for the medication as her pain has been very well controlled. No prescriptions for pain meds and given today.  2. Transfusion associated hemochromatosis: The patient has an elevated ferritin level of 1418. Please note the patient does have a wound and this could certainly represent an acute phase reactant. At this time I will not start the patient on chelation therapy but rather repeat her ferritin levels in approximately one to 2 weeks to reassess.  3. Thrombocytosis: The patient had a markedly  elevated platelet level which is felt to be reactionary thrombocytosis associated with her wound. I will repeat her laboratory studies today and evaluate platelet levels.  4. Leg Wound: Pt under the care of wound center.  Total time spent 30 minutes.   Greater than 50% of time spent in counseling discussion regarding therapeutic assessment and plan of sickle cell disease.

## 2012-11-11 ENCOUNTER — Telehealth (HOSPITAL_COMMUNITY): Payer: Self-pay

## 2012-11-11 NOTE — Telephone Encounter (Signed)
This CM received a copy of reply to financial eligibility application for [employer](Purchase of medical services). The application is pending for additional information, need explanation of means of support on zero or limited income. This CM resubmitted information to [facility] via fax and hard copy. This CM will continue to follow.  This CM spoke with Delila Pereyra with community [facility] agency who stated she has not spoke with Ms.Cuffe however will continue to contact her to get her registered with the community agency.    Karoline Caldwell, RN, BSN, [place]   [phone]

## 2012-11-18 ENCOUNTER — Encounter (HOSPITAL_BASED_OUTPATIENT_CLINIC_OR_DEPARTMENT_OTHER): Payer: Medicaid Other | Attending: General Surgery

## 2012-11-18 DIAGNOSIS — D571 Sickle-cell disease 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: Secondary | ICD-10-CM | POA: Insufficient documentation

## 2012-11-18 DIAGNOSIS — I87309 Chronic venous hypertension (idiopathic) without complications of unspecified lower extremity: Secondary | ICD-10-CM | POA: Insufficient documentation

## 2012-11-26 ENCOUNTER — Telehealth (HOSPITAL_COMMUNITY): Payer: Self-pay

## 2012-11-26 NOTE — Telephone Encounter (Signed)
This CM spoke with [name] with [facility] to advise of Rx orders, Folic acid 1 mg PO daily, #30 with 11 refills, Hydroxyurea 500 mg capsule, 2 caps PO daily, take with foods to minimize GI effects, #60 with 3 refills, Potassium K-Dur 10 mEq tab PO 2 times daily #60. [name] now has [employer] (sickle cell insurance coverage) the potassium may not be covered under the plan. This CM created a prior authorization for [facility] [id number], [id number].     Karoline Caldwell, RN, BSN, [place]     [phone]

## 2012-12-14 ENCOUNTER — Ambulatory Visit (HOSPITAL_COMMUNITY)
Admission: AD | Admit: 2012-12-14 | Discharge: 2012-12-14 | Disposition: A | Discharge status: Home / Self Care | Payer: Medicaid Other | Source: Ambulatory Visit | Attending: Internal Medicine | Admitting: Internal Medicine

## 2012-12-14 ENCOUNTER — Ambulatory Visit (INDEPENDENT_AMBULATORY_CARE_PROVIDER_SITE_OTHER): Payer: Medicaid Other | Admitting: Internal Medicine

## 2012-12-14 ENCOUNTER — Encounter: Payer: Self-pay | Admitting: Internal Medicine

## 2012-12-14 VITALS — BP 117/72 | HR 83 | Temp 98.6°F | Wt 118.0 lb

## 2012-12-14 DIAGNOSIS — D571 Sickle-cell disease 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: Secondary | ICD-10-CM

## 2012-12-14 DIAGNOSIS — L97909 Non-pressure chronic ulcer of unspecified part of unspecified lower leg with unspecified severity: Secondary | ICD-10-CM | POA: Insufficient documentation

## 2012-12-14 LAB — CBC WITH DIFFERENTIAL/PLATELET
Band Neutrophils: 0 % (ref 0–10)
Basophils Absolute: 0 10*3/uL (ref 0.0–0.1)
Basophils Relative: 0 % (ref 0–1)
Eosinophils Absolute: 0.1 10*3/uL (ref 0.0–0.7)
Eosinophils Relative: 1 % (ref 0–5)
HCT: 21.8 % — ABNORMAL LOW (ref 36.0–46.0)
Hemoglobin: 7.9 g/dL — ABNORMAL LOW (ref 12.0–15.0)
MCH: 30.7 pg (ref 26.0–34.0)
MCHC: 36.2 g/dL — ABNORMAL HIGH (ref 30.0–36.0)
MCV: 84.8 fL (ref 78.0–100.0)
Metamyelocytes Relative: 0 %
Myelocytes: 0 %
Neutro Abs: 5.6 10*3/uL (ref 1.7–7.7)
Neutrophils Relative %: 57 % (ref 43–77)
RBC: 2.57 MIL/uL — ABNORMAL LOW (ref 3.87–5.11)

## 2012-12-14 NOTE — Procedures (Signed)
[facility]  Procedure Note  [name] ZOX:[id number] DOB: [birth year] DOA: 12/14/2012   PCP: [name],[name], MD   Associated Diagnosis: Sickle Cell  Procedure Note: labs drawn per order   Condition During Procedure: tolerated lab draw without any complaints or difficulty   Condition at Discharge:  Patient stable at discharge   [name], RN  [facility]

## 2012-12-14 NOTE — Progress Notes (Signed)
  Subjective:    Patient ID: [name], female    DOB: 11/04/1970, 42 y.o.   MRN: [id number]  HPI: Pt here for follow up on sickle cell disease. She states that she has had many pain free days and her maximum pain score has been 2/10 mostly localized to right knee. She has been fairly well controlled with her pain and has had many days without a requirement for analgesia.    Review of Systems  Constitutional: Negative.   HENT: Negative.   Eyes: Negative.   Respiratory: Negative.   Cardiovascular: Negative.   Gastrointestinal: Negative.   Endocrine: Negative.   Genitourinary: Negative.   Musculoskeletal: Positive for arthralgias (right knee). Negative for myalgias.  Skin: Negative.   Allergic/Immunologic: Negative.   Neurological: Negative.   Hematological: Negative.   Psychiatric/Behavioral: Negative.        Objective:   Physical Exam  Constitutional: She is oriented to person, place, and time. She appears well-developed and well-nourished.  HENT:  Head: Normocephalic and atraumatic.  Eyes: Conjunctivae and EOM are normal. Pupils are equal, round, and reactive to light. No scleral icterus.  Neck: Normal range of motion. Neck supple. No JVD present. No thyromegaly present.  Cardiovascular: Normal rate and regular rhythm.  Exam reveals no gallop and no friction rub.   No murmur heard. Pulmonary/Chest: Effort normal and breath sounds normal. She has no wheezes. She has no rales.  Abdominal: Soft. Bowel sounds are normal. She exhibits no distension and no mass. There is no tenderness.  Musculoskeletal: Normal range of motion.  Lymphadenopathy:    She has no cervical adenopathy.  Neurological: She is alert and oriented to person, place, and time. No cranial nerve deficit.  Skin: Skin is warm and dry.  Psychiatric: She has a normal mood and affect. Her behavior is normal. Judgment and thought content normal.          Assessment & Plan:  1. Hb SS: Pt has been doing  well.She has not had a vision examination in greater than 1 year. Her last pneumonia vaccine has been in the last 5 years. Urinalysis from 10/16/2012 was negative for protein. Will continue Hydrea at current dosing and check CBC with diff today. Continue Folic Acid.  Her pain is adequately controlled anda she has only required intermittent analgesia.  2. Leg Ulcer. Fully healed. Pt has been discharged from wound clinic

## 2012-12-17 ENCOUNTER — Other Ambulatory Visit: Payer: Self-pay | Admitting: Internal Medicine

## 2012-12-17 DIAGNOSIS — D571 Sickle-cell disease 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: Secondary | ICD-10-CM

## 2013-02-10 ENCOUNTER — Encounter (HOSPITAL_BASED_OUTPATIENT_CLINIC_OR_DEPARTMENT_OTHER): Payer: Medicaid Other | Attending: General Surgery

## 2013-02-10 DIAGNOSIS — D571 Sickle-cell disease 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: Secondary | ICD-10-CM | POA: Insufficient documentation

## 2013-02-10 DIAGNOSIS — I87309 Chronic venous hypertension (idiopathic) without complications of unspecified lower extremity: Secondary | ICD-10-CM | POA: Insufficient documentation

## 2013-02-10 DIAGNOSIS — L299 Pruritus, unspecified: Secondary | ICD-10-CM | POA: Insufficient documentation

## 2013-02-10 DIAGNOSIS — R21 Rash and other nonspecific skin eruption: Secondary | ICD-10-CM | POA: Insufficient documentation

## 2013-02-17 ENCOUNTER — Encounter (HOSPITAL_BASED_OUTPATIENT_CLINIC_OR_DEPARTMENT_OTHER): Payer: PRIVATE HEALTH INSURANCE | Attending: General Surgery

## 2013-02-17 DIAGNOSIS — D571 Sickle-cell disease 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: Secondary | ICD-10-CM | POA: Insufficient documentation

## 2013-02-17 DIAGNOSIS — L97909 Non-pressure chronic ulcer of unspecified part of unspecified lower leg with unspecified severity: Secondary | ICD-10-CM | POA: Insufficient documentation

## 2013-02-17 DIAGNOSIS — I87319 Chronic venous hypertension (idiopathic) with ulcer of unspecified lower extremity: Secondary | ICD-10-CM | POA: Insufficient documentation

## 2013-03-16 ENCOUNTER — Encounter: Payer: Self-pay | Admitting: Internal Medicine

## 2013-03-16 ENCOUNTER — Ambulatory Visit (HOSPITAL_COMMUNITY)
Admission: AD | Admit: 2013-03-16 | Discharge: 2013-03-16 | Disposition: A | Discharge status: Home / Self Care | Payer: Medicaid Other | Source: Ambulatory Visit | Attending: Internal Medicine | Admitting: Internal Medicine

## 2013-03-16 ENCOUNTER — Ambulatory Visit (INDEPENDENT_AMBULATORY_CARE_PROVIDER_SITE_OTHER): Payer: Medicaid Other | Admitting: Internal Medicine

## 2013-03-16 VITALS — BP 123/72 | HR 85 | Temp 98.6°F | Resp 14 | Ht 66.0 in | Wt 127.0 lb

## 2013-03-16 DIAGNOSIS — L299 Pruritus, unspecified: Secondary | ICD-10-CM | POA: Insufficient documentation

## 2013-03-16 DIAGNOSIS — Z23 Encounter for immunization: Secondary | ICD-10-CM

## 2013-03-16 DIAGNOSIS — D571 Sickle-cell disease 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: Secondary | ICD-10-CM

## 2013-03-16 DIAGNOSIS — Z79899 Other long term (current) drug therapy: Secondary | ICD-10-CM | POA: Insufficient documentation

## 2013-03-16 DIAGNOSIS — D5700 Hb-SS disease with crisis, unspecified: Secondary | ICD-10-CM

## 2013-03-16 LAB — CBC WITH DIFFERENTIAL/PLATELET
Basophils Absolute: 0.1 10*3/uL (ref 0.0–0.1)
Eosinophils Absolute: 0.8 10*3/uL — ABNORMAL HIGH (ref 0.0–0.7)
HCT: 21.4 % — ABNORMAL LOW (ref 36.0–46.0)
Lymphocytes Relative: 28 % (ref 12–46)
Lymphs Abs: 3.6 10*3/uL (ref 0.7–4.0)
MCHC: 36.4 g/dL — ABNORMAL HIGH (ref 30.0–36.0)
MCV: 82.3 fL (ref 78.0–100.0)
Neutro Abs: 7.3 10*3/uL (ref 1.7–7.7)
Platelets: 553 10*3/uL — ABNORMAL HIGH (ref 150–400)
RDW: 25 % — ABNORMAL HIGH (ref 11.5–15.5)

## 2013-03-16 MED ORDER — OXYCODONE HCL 5 MG PO TABS: 5.0000 mg | ORAL_TABLET | ORAL | Status: DC | PRN

## 2013-03-16 MED ORDER — LORATADINE 10 MG PO TABS: 10.0000 mg | ORAL_TABLET | Freq: Every day | ORAL | Status: DC

## 2013-03-16 NOTE — Procedures (Signed)
[facility]  Procedure Note  [name] YQM:[id number] DOB: 1970/06/30 DOA: 03/16/2013   PCP: [name],[name], MD   Associated Diagnosis: Hb-SS disease 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    Procedure Note:  Lab blood draw    Condition During Procedure: tolerated well   Condition at Discharge: no complaints, tolerated well, patient in no apparent distress, left with belongings   Cherre Blanc, Rocky Crafts, RN  [facility]

## 2013-03-16 NOTE — Progress Notes (Signed)
  Subjective:    Patient ID: [name], female    DOB: November 08, 1970, 42 y.o.   MRN: [id number]  HPI: Pt here today for a follow-up visit with regard to her Hb SS. 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 She states that she has not had much pain and has required only 5 mg of oxycodone daily.   She has not pursued her vision examination as discussed on last visit and missed the appointment that was scheduled for her.  She has had a lot of itching on her trunk, head and extremities. She states that it has been accompanied by a rash which is absent now but which patient is unable to describe. She has had no change in laundry detergent, perfumes or eating habits. No one else in the home has had the rash and itching.  Review of Systems  Constitutional: Negative.   HENT: Negative.   Eyes: Negative.   Respiratory: Negative.   Cardiovascular: Negative.   Gastrointestinal: Negative.   Endocrine: Negative.   Genitourinary: Negative.   Musculoskeletal: Positive for myalgias.  Skin: Positive for rash.  Allergic/Immunologic: Negative.   Neurological: Negative.   Hematological: Negative.   Psychiatric/Behavioral: Negative.        Objective:   Physical Exam  Constitutional: She is oriented to person, place, and time. She appears well-developed and well-nourished.  HENT:  Head: Normocephalic and atraumatic.  Eyes: Conjunctivae and EOM are normal. Pupils are equal, round, and reactive to light. No scleral icterus.  Neck: Normal range of motion. Neck supple. No JVD present. No thyromegaly present.  Cardiovascular: Normal rate and regular rhythm.  Exam reveals no gallop and no friction rub.   No murmur heard. Pulmonary/Chest: Effort normal and breath sounds normal. She has no wheezes. She has no rales.  Abdominal: Soft. Bowel sounds are normal. She exhibits no distension and no mass. There is no tenderness.  Musculoskeletal: Normal  range of motion.  Neurological: She is alert and oriented to person, place, and time. No cranial nerve deficit.  Skin: Skin is warm and dry. No rash noted.  Psychiatric: She has a normal mood and affect. Her behavior is normal. Judgment and thought content normal.          Assessment & Plan:  1. Hb SS: Pt reports that she stopped taking the Hydrea after one month because she wants to get pregnant. I have explained risks of complications without Hydrea and she understands and states that she will resume taking today. Pt has been compliant with Folic acid  Pt has not scheduled vision examination. With regard to her pain, patient has only needed to take her Oxycodone 1 x day.  -Genetic counseling: Pt contemplating having another child. Discussed screening partner for trait, pregnancy risk to patient anad risk of being off Hydrea.  2. Pruritis: Pt complains of itching on trunk and extremities and it has been accompanied by a rash which is absent now but which patient is unable to describe.  3. 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  4. Immunization: Influenza vaccine today. Pt reports that she had TDAP in October of 2005 in [place].  Labs: CBC with diff, CMET, Magnesium  RTC: 2 months

## 2013-04-30 ENCOUNTER — Other Ambulatory Visit: Payer: Medicaid Other

## 2013-04-30 ENCOUNTER — Other Ambulatory Visit: Payer: Self-pay | Admitting: Internal Medicine

## 2013-04-30 DIAGNOSIS — D571 Sickle-cell disease 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: Secondary | ICD-10-CM

## 2013-05-01 LAB — CBC
MCH: 29.9 pg (ref 26.0–34.0)
MCV: 85.7 fL (ref 78.0–100.0)
Platelets: 447 10*3/uL — ABNORMAL HIGH (ref 150–400)
RDW: 23.2 % — ABNORMAL HIGH (ref 11.5–15.5)

## 2013-05-01 LAB — DIFFERENTIAL
Basophils Absolute: 0.1 10*3/uL (ref 0.0–0.1)
Basophils Relative: 1 % (ref 0–1)
Eosinophils Absolute: 0.7 10*3/uL (ref 0.0–0.7)
Eosinophils Relative: 6 % — ABNORMAL HIGH (ref 0–5)

## 2013-05-01 LAB — COMPREHENSIVE METABOLIC PANEL
AST: 43 U/L — ABNORMAL HIGH (ref 0–37)
Alkaline Phosphatase: 49 U/L (ref 39–117)
BUN: 6 mg/dL (ref 6–23)
Calcium: 9 mg/dL (ref 8.4–10.5)
Creat: 0.35 mg/dL — ABNORMAL LOW (ref 0.50–1.10)

## 2013-05-07 ENCOUNTER — Ambulatory Visit (INDEPENDENT_AMBULATORY_CARE_PROVIDER_SITE_OTHER): Payer: Medicaid Other | Admitting: Internal Medicine

## 2013-05-07 ENCOUNTER — Encounter: Payer: Self-pay | Admitting: Internal Medicine

## 2013-05-07 ENCOUNTER — Other Ambulatory Visit: Payer: Medicaid Other

## 2013-05-07 ENCOUNTER — Ambulatory Visit: Payer: Medicaid Other | Admitting: Internal Medicine

## 2013-05-07 VITALS — BP 125/66 | HR 74 | Temp 98.1°F | Resp 14 | Ht 66.0 in | Wt 128.0 lb

## 2013-05-07 DIAGNOSIS — L299 Pruritus, unspecified: Secondary | ICD-10-CM

## 2013-05-07 DIAGNOSIS — E876 Hypokalemia: Secondary | ICD-10-CM | POA: Insufficient documentation

## 2013-05-07 DIAGNOSIS — D5700 Hb-SS disease with crisis, unspecified: Secondary | ICD-10-CM

## 2013-05-07 LAB — HEPATITIS C ANTIBODY: HCV Ab: NEGATIVE

## 2013-05-07 MED ORDER — OXYCODONE HCL 5 MG PO TABS: 5.0000 mg | ORAL_TABLET | ORAL | Status: DC | PRN

## 2013-05-07 MED ORDER — HYDROXYZINE HCL 10 MG PO TABS: 10.0000 mg | ORAL_TABLET | Freq: Three times a day (TID) | ORAL | Status: DC | PRN

## 2013-05-07 NOTE — Progress Notes (Signed)
   Subjective:    Patient ID: [name], female    DOB: 05-Sep-1970, 42 y.o.   MRN: [id number]  HPI: Pt here for follow up with use of Hydrea anreview of labs. However patient has not been taking Hydrea and states that she doesn't want to. Further discussion revealed that she is currently not taking Hydrea as she wants to have a baby.   Pt also continues to have itching. She has no lesions preceding the itching, however after itching she has formation of bumps.    Review of Systems  Constitutional: Negative.   HENT: Negative.   Eyes: Negative.   Respiratory: Negative.   Cardiovascular: Negative.   Gastrointestinal: Negative.   Endocrine: Negative.   Genitourinary: Negative.   Musculoskeletal: Positive for arthralgias and myalgias.  Skin: Positive for rash.       Associated with itching  Allergic/Immunologic: Negative.   Neurological: Negative.   Hematological: Negative.   Psychiatric/Behavioral: Negative.        Objective:   Physical Exam  Constitutional: She is oriented to person, place, and time. She appears well-developed and well-nourished.  HENT:  Head: Normocephalic and atraumatic.  Eyes: Conjunctivae and EOM are normal. Pupils are equal, round, and reactive to light. No scleral icterus.  Neck: Normal range of motion. Neck supple. No JVD present. No thyromegaly present.  Cardiovascular: Normal rate and regular rhythm.  Exam reveals no gallop and no friction rub.   No murmur heard. Pulmonary/Chest: Effort normal and breath sounds normal. She has no wheezes. She has no rales.  Abdominal: Soft. Bowel sounds are normal. She exhibits no distension and no mass. There is no tenderness.  Musculoskeletal: Normal range of motion.  Lymphadenopathy:    She has cervical adenopathy.  Neurological: She is alert and oriented to person, place, and time. No cranial nerve deficit.  Skin: Skin is warm and dry. Rash (areas of Lichen Planus on trunk and extremities. No tunnelling  noted.) noted.  Psychiatric: She has a normal mood and affect. Her behavior is normal. Judgment and thought content normal.          Assessment & Plan:  1. Hypokalemia: Pt has been without potassium for 2 weeks and her potassium levels were normal.  2. Pt not currently taking Hydrea as she wants to have a baby. She has stopped taking Hydrea for more than 3 months and is currently trying to have a baby. Pt states that if she is not able to get pregnant in the next 6 months then she will resume Hydrea.  -Pain well controlled. Refill given for Oxycodone. -Pt still has not followed up with vision examination. She will reschedule.  3. Lichen Planus: Pt has itching resulting in excoriation and lichen planus. I will prescribe 1% topical hydrocortisone OTC in addition to Atarax.

## 2013-05-17 ENCOUNTER — Ambulatory Visit: Payer: Medicaid Other | Admitting: Internal Medicine

## 2013-06-03 ENCOUNTER — Ambulatory Visit (INDEPENDENT_AMBULATORY_CARE_PROVIDER_SITE_OTHER): Payer: Medicaid Other | Admitting: *Deleted

## 2013-06-03 ENCOUNTER — Encounter: Payer: Self-pay | Admitting: *Deleted

## 2013-06-03 DIAGNOSIS — Z3490 Encounter for supervision of normal pregnancy, unspecified, unspecified trimester: Secondary | ICD-10-CM

## 2013-06-03 DIAGNOSIS — N926 Irregular menstruation, unspecified: Secondary | ICD-10-CM

## 2013-06-03 DIAGNOSIS — Z3201 Encounter for pregnancy test, result positive: Secondary | ICD-10-CM

## 2013-06-03 LAB — POCT PREGNANCY, URINE: Preg Test, Ur: POSITIVE — AB

## 2013-06-03 LAB — HIV ANTIBODY (ROUTINE TESTING W REFLEX): HIV: NONREACTIVE

## 2013-06-03 NOTE — Progress Notes (Signed)
Anatomy scan scheduled for September 16 928 am

## 2013-06-03 NOTE — Progress Notes (Signed)
Here for pregnancy test, which was positive. Wants to start prenatal care here.  Will get blood today. Has sure LMP. Will order [place]

## 2013-06-04 LAB — PRESCRIPTION MONITORING PROFILE (19 PANEL)
Amphetamine/Meth: NEGATIVE ng/mL
BENZODIAZEPINE SCREEN, URINE: NEGATIVE ng/mL
BUPRENORPHINE, URINE: NEGATIVE ng/mL
Barbiturate Screen, Urine: NEGATIVE ng/mL
CANNABINOID SCRN UR: NEGATIVE ng/mL
CARISOPRODOL, URINE: NEGATIVE ng/mL
COCAINE METABOLITES: NEGATIVE ng/mL
Creatinine, Urine: 6.28 mg/dL — ABNORMAL LOW (ref >20.0)
ECSTASY: NEGATIVE ng/mL
Fentanyl, Ur: NEGATIVE ng/mL
MEPERIDINE UR: NEGATIVE ng/mL
METHADONE SCREEN, URINE: NEGATIVE ng/mL
Methaqualone: NEGATIVE ng/mL
Nitrites, Initial: NEGATIVE ug/mL
Opiate Screen, Urine: NEGATIVE ng/mL
Oxycodone Screen, Ur: NEGATIVE ng/mL
PHENCYCLIDINE, UR: NEGATIVE ng/mL
Propoxyphene: NEGATIVE ng/mL
Tapentadol, urine: NEGATIVE ng/mL
Tramadol Scrn, Ur: NEGATIVE ng/mL
Zolpidem, Urine: NEGATIVE ng/mL
pH, Initial: 5.7 pH (ref 4.5–8.9)

## 2013-06-04 LAB — OBSTETRIC PANEL
Antibody Screen: NEGATIVE
BASOS ABS: 0.2 10*3/uL — AB (ref 0.0–0.1)
Basophils Relative: 1 % (ref 0–1)
EOS ABS: 0.4 10*3/uL (ref 0.0–0.7)
Eosinophils Relative: 3 % (ref 0–5)
HCT: 21 % — ABNORMAL LOW (ref 36.0–46.0)
Hemoglobin: 7.7 g/dL — ABNORMAL LOW (ref 12.0–15.0)
Hepatitis B Surface Ag: NEGATIVE
LYMPHS ABS: 2.9 10*3/uL (ref 0.7–4.0)
Lymphocytes Relative: 21 % (ref 12–46)
MCH: 30.4 pg (ref 26.0–34.0)
MCHC: 36.7 g/dL — ABNORMAL HIGH (ref 30.0–36.0)
MCV: 83 fL (ref 78.0–100.0)
Monocytes Absolute: 1.7 10*3/uL — ABNORMAL HIGH (ref 0.1–1.0)
Monocytes Relative: 12 % (ref 3–12)
Neutro Abs: 8.7 10*3/uL — ABNORMAL HIGH (ref 1.7–7.7)
Neutrophils Relative %: 63 % (ref 43–77)
PLATELETS: 525 10*3/uL — AB (ref 150–400)
RBC: 2.53 MIL/uL — ABNORMAL LOW (ref 3.87–5.11)
RDW: 20.2 % — ABNORMAL HIGH (ref 11.5–15.5)
Rh Type: POSITIVE
Rubella: 17.6 Index — ABNORMAL HIGH (ref <0.90)
WBC: 14 10*3/uL — AB (ref 4.0–10.5)

## 2013-06-06 LAB — CULTURE, OB URINE: Colony Count: 15000

## 2013-06-07 ENCOUNTER — Telehealth: Payer: Self-pay | Admitting: Internal Medicine

## 2013-06-07 DIAGNOSIS — D571 Sickle-cell disease 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: Secondary | ICD-10-CM

## 2013-06-07 LAB — HEMOGLOBINOPATHY EVALUATION
HEMOGLOBIN OTHER: 0 %
HGB A: 0 % — AB (ref 96.8–97.8)
HGB F QUANT: 7.8 % — AB (ref 0.0–2.0)
HGB S QUANTITAION: 88.5 % — AB
Hgb A2 Quant: 3.7 % — ABNORMAL HIGH (ref 2.2–3.2)

## 2013-06-07 MED ORDER — FOLIC ACID 1 MG PO TABS: 2.0000 mg | ORAL_TABLET | Freq: Every day | ORAL | Status: DC

## 2013-06-07 NOTE — Telephone Encounter (Signed)
Pt newly diagnosed with pregnancy. She intends to maintain pregnancy to delivery. Advised patient to increase Folic acid to 2 mg daily and discontinue Hydrea. Pt to make appointment for evaluation and referral.

## 2013-06-08 ENCOUNTER — Telehealth: Payer: Self-pay

## 2013-06-08 DIAGNOSIS — N390 Urinary tract infection, site not specified: Secondary | ICD-10-CM

## 2013-06-08 MED ORDER — NITROFURANTOIN MONOHYD MACRO 100 MG PO CAPS: 100.0000 mg | ORAL_CAPSULE | Freq: Two times a day (BID) | ORAL | Status: DC

## 2013-06-08 NOTE — Telephone Encounter (Signed)
Called pt. And informed her of UTI and the need for macrobid BID for 7 days. Informed her prescription was sent to [facility]. Pt. Verbalized understanding and had no further questions or concerns.

## 2013-06-08 NOTE — Telephone Encounter (Signed)
Message copied by Geanie Logan on Tue Jun 08, 2013  4:21 PM ------      Message from: Woodroe Mode      Created: Tue Jun 08, 2013 11:39 AM       macrobid 100 mg bid UTI 7 days ------

## 2013-06-09 ENCOUNTER — Ambulatory Visit (HOSPITAL_COMMUNITY)
Admission: AD | Admit: 2013-06-09 | Discharge: 2013-06-09 | Disposition: A | Discharge status: Home / Self Care | Payer: PRIVATE HEALTH INSURANCE | Source: Ambulatory Visit | Attending: Internal Medicine | Admitting: Internal Medicine

## 2013-06-09 ENCOUNTER — Encounter: Payer: Self-pay | Admitting: Internal Medicine

## 2013-06-09 ENCOUNTER — Telehealth: Payer: Self-pay | Admitting: *Deleted

## 2013-06-09 ENCOUNTER — Ambulatory Visit (INDEPENDENT_AMBULATORY_CARE_PROVIDER_SITE_OTHER): Payer: Medicaid Other | Admitting: Internal Medicine

## 2013-06-09 VITALS — BP 118/69 | HR 79 | Temp 98.1°F | Resp 16 | Ht 66.0 in | Wt 128.0 lb

## 2013-06-09 DIAGNOSIS — D571 Sickle-cell disease 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: Secondary | ICD-10-CM | POA: Diagnosis not present

## 2013-06-09 DIAGNOSIS — N390 Urinary tract infection, site not specified: Secondary | ICD-10-CM | POA: Insufficient documentation

## 2013-06-09 DIAGNOSIS — Z331 Pregnant state, incidental: Secondary | ICD-10-CM

## 2013-06-09 DIAGNOSIS — R599 Enlarged lymph nodes, unspecified: Secondary | ICD-10-CM | POA: Diagnosis not present

## 2013-06-09 DIAGNOSIS — Z3490 Encounter for supervision of normal pregnancy, unspecified, unspecified trimester: Secondary | ICD-10-CM

## 2013-06-09 DIAGNOSIS — Z01812 Encounter for preprocedural laboratory examination: Secondary | ICD-10-CM | POA: Diagnosis not present

## 2013-06-09 LAB — CBC WITH DIFFERENTIAL/PLATELET
BASOS PCT: 1 % (ref 0–1)
Basophils Absolute: 0.1 10*3/uL (ref 0.0–0.1)
Eosinophils Absolute: 0.4 10*3/uL (ref 0.0–0.7)
Eosinophils Relative: 3 % (ref 0–5)
HCT: 21 % — ABNORMAL LOW (ref 36.0–46.0)
HEMOGLOBIN: 7.9 g/dL — AB (ref 12.0–15.0)
LYMPHS PCT: 16 % (ref 12–46)
Lymphs Abs: 2 10*3/uL (ref 0.7–4.0)
MCH: 31.3 pg (ref 26.0–34.0)
MCHC: 37.6 g/dL — AB (ref 30.0–36.0)
MCV: 83.3 fL (ref 78.0–100.0)
MONOS PCT: 15 % — AB (ref 3–12)
Monocytes Absolute: 1.9 10*3/uL — ABNORMAL HIGH (ref 0.1–1.0)
NEUTROS PCT: 65 % (ref 43–77)
Neutro Abs: 8.4 10*3/uL — ABNORMAL HIGH (ref 1.7–7.7)
Platelets: ADEQUATE 10*3/uL (ref 150–400)
RBC: 2.52 MIL/uL — ABNORMAL LOW (ref 3.87–5.11)
RDW: 22.6 % — ABNORMAL HIGH (ref 11.5–15.5)
WBC: 12.8 10*3/uL — AB (ref 4.0–10.5)

## 2013-06-09 MED ORDER — FOLIC ACID 1 MG PO TABS: 2.0000 mg | ORAL_TABLET | Freq: Every day | ORAL | Status: DC

## 2013-06-09 NOTE — Progress Notes (Signed)
   Subjective:    Patient ID: [name], female    DOB: [birth year], 43 y.o.   MRN: [id number]  HPI: Pt with Hb SS  Who is G6 P2M3 who is approximately 5-[redacted] weeks pregnant. Last Hb Electrophoresis showed Hb S 88.5% and Hb F 7.8%. Pt's last Hb was 7.3 and Hct 20.9.  Pt was diagnosed with a UTI at the Ob-Gyn's office. She has a prescription for Macrobid per notes. Pt has not yet picked up prescription and will pick it up today. Pt states that yesterday she had some pain in the left flank. However she has no pain today. She denies dysuria, fever, chills.   Pt is reunited with her husband who is the father in her current pregnancy. She is very happy about pregnancy   Review of Systems  Constitutional: Negative.   HENT: Negative.   Eyes: Negative.   Respiratory: Negative.   Cardiovascular: Negative.   Gastrointestinal: Negative.   Endocrine: Negative.   Genitourinary: Positive for menstrual problem (LNMP 04/28/2013. Pt currently pregnant).  Skin: Negative.   Allergic/Immunologic: Negative.   Neurological: Negative.   Hematological: Negative.   Psychiatric/Behavioral: Negative.        Objective:   Physical Exam  Constitutional: She is oriented to person, place, and time. She appears well-developed and well-nourished.  HENT:  Head: Normocephalic and atraumatic.  Eyes: Conjunctivae and EOM are normal. Pupils are equal, round, and reactive to light. No scleral icterus.  Neck: Normal range of motion. Neck supple. No JVD present. No thyromegaly present.  Cardiovascular: Normal rate and regular rhythm.  Exam reveals no gallop and no friction rub.   No murmur heard. Pulmonary/Chest: Effort normal and breath sounds normal. She has no wheezes. She has no rales.  Abdominal: Soft. Bowel sounds are normal. She exhibits no distension and no mass. There is no tenderness.  Genitourinary:  No CVA tenderness  Musculoskeletal: Normal range of motion.  Lymphadenopathy:    She has cervical  adenopathy.  Neurological: She is alert and oriented to person, place, and time. No cranial nerve deficit.  Skin: Skin is warm and dry.  Psychiatric: She has a normal mood and affect. Her behavior is normal. Judgment and thought content normal.          Assessment & Plan:  1. Hb SS with pregnancy: Pt has a high HB S%. Will exchange transfuse ot get Hb S to 50% and then keep Hb or above to suppress bone marrow. Will discuss with Ob. Reviewed labs and will T&S today and plan 1st exchange transfusion on Friday 06/11/2013.  - needs vision examination. Our office has been having a difficult time with finding a provider who accepts pt's insurance.   - Increase folic acid to 2 mg daily.  -Pain management: advised tylenol as first line and Ocycodone if tylenol ineffective. No NSAIDS. Pt advised and verbalizes understanding.  -Pt needs to be on Iron free prenatal vitamin  2. UTI: Ob treating with Macrobid.

## 2013-06-09 NOTE — Procedures (Signed)
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 No complaints.    Wendie Simmer, RN  [facility]

## 2013-06-09 NOTE — Telephone Encounter (Addendum)
Eye exam on 07/20/13 @ 9:20 am with Dr. Nehemiah Massed [phone] [facility] patient aware. New patient packet will be mailed

## 2013-06-10 ENCOUNTER — Telehealth: Payer: Self-pay | Admitting: Hematology

## 2013-06-10 NOTE — Telephone Encounter (Signed)
Called and spoke with patient about blood transfusion.  I advised that the blood is ready.  Patient states she can come in the morning at 8:30.

## 2013-06-11 ENCOUNTER — Non-Acute Institutional Stay (HOSPITAL_COMMUNITY)
Admission: AD | Admit: 2013-06-11 | Discharge: 2013-06-11 | Disposition: A | Discharge status: Home / Self Care | Payer: Medicaid Other | Source: Ambulatory Visit | Attending: Internal Medicine | Admitting: Internal Medicine

## 2013-06-11 DIAGNOSIS — D571 Sickle-cell disease 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: Secondary | ICD-10-CM | POA: Insufficient documentation

## 2013-06-11 DIAGNOSIS — O99019 Anemia complicating pregnancy, unspecified trimester: Secondary | ICD-10-CM | POA: Insufficient documentation

## 2013-06-11 MED ORDER — SODIUM CHLORIDE 0.9 % IV BOLUS (SEPSIS)
1000.0000 mL | Freq: Once | INTRAVENOUS | Status: AC
  Administered 2013-06-11: 1000 mL via INTRAVENOUS

## 2013-06-11 NOTE — Procedures (Addendum)
Pt received 1000cc NS bolus. Blood exchange completed, 500cc removed; 1 unit of PRBCs transfused. Pt tolerated well, VSS. Pt a&o at time procedure complete. Pt denies any further needs. Pt ambulated to front lobby in no distress.  Pt diagnosis: Hb SS 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 and Pregnancy

## 2013-06-11 NOTE — Progress Notes (Addendum)
Pt with Hb SS and early pregnancy here for partial exchange transfusion. Nursing unable to remove blood due to increased viscosity. Will infuse 1L 0.9 NS and wait for 1 hour then attempt partial exchange transfusion of removal of 500 ml blood and transfusion of 1 unit PRBC's.

## 2013-06-13 LAB — TYPE AND SCREEN
ABO/RH(D): B POS
Antibody Screen: NEGATIVE
UNIT DIVISION: 0
Unit division: 0

## 2013-06-16 ENCOUNTER — Encounter (HOSPITAL_COMMUNITY): Payer: Self-pay

## 2013-06-16 NOTE — Progress Notes (Signed)
Patient ID: [name], female   DOB: [birth year], 43 y.o.   MRN: [id number] [name] has a scheduled eye exam on 07/20/13 with Dr. Abel Presto.  PA [phone] on file. This CM will forward a copy to [facility] CMA to fax to provider and this CM will send a copy for scanning. This CM will continue to monitor.

## 2013-06-22 ENCOUNTER — Telehealth: Payer: Self-pay | Admitting: Hematology and Oncology

## 2013-06-22 NOTE — Telephone Encounter (Signed)
C/D 06/22/13 for appt. 06/23/13 °

## 2013-06-22 NOTE — Telephone Encounter (Signed)
NEW PATIIENT SCHEDULE 02/04 @ 9:45 W/DR. [place].  REFERRING DR. Sharyn Lull [name] DX-HB-SS DISEASE W/O CRISIS PREGNANT

## 2013-06-22 NOTE — Telephone Encounter (Signed)
LEFT MESSAGE FOR PATIENT TO RETURN CALL TO SCHEDULE NP APPT.  °

## 2013-06-23 ENCOUNTER — Telehealth: Payer: Self-pay | Admitting: *Deleted

## 2013-06-23 ENCOUNTER — Ambulatory Visit (HOSPITAL_BASED_OUTPATIENT_CLINIC_OR_DEPARTMENT_OTHER): Payer: Medicaid Other

## 2013-06-23 ENCOUNTER — Ambulatory Visit: Payer: Medicaid Other

## 2013-06-23 ENCOUNTER — Encounter: Payer: Self-pay | Admitting: Hematology and Oncology

## 2013-06-23 ENCOUNTER — Ambulatory Visit (HOSPITAL_BASED_OUTPATIENT_CLINIC_OR_DEPARTMENT_OTHER): Payer: Medicaid Other | Admitting: Hematology and Oncology

## 2013-06-23 VITALS — BP 114/62 | HR 75 | Temp 98.1°F | Resp 18 | Ht 66.0 in | Wt 133.2 lb

## 2013-06-23 DIAGNOSIS — Z331 Pregnant state, incidental: Secondary | ICD-10-CM

## 2013-06-23 DIAGNOSIS — D72829 Elevated white blood cell count, unspecified: Secondary | ICD-10-CM

## 2013-06-23 DIAGNOSIS — Z3490 Encounter for supervision of normal pregnancy, unspecified, unspecified trimester: Secondary | ICD-10-CM

## 2013-06-23 DIAGNOSIS — D571 Sickle-cell disease 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: Secondary | ICD-10-CM

## 2013-06-23 DIAGNOSIS — D473 Essential (hemorrhagic) thrombocythemia: Secondary | ICD-10-CM

## 2013-06-23 DIAGNOSIS — D696 Thrombocytopenia, unspecified: Secondary | ICD-10-CM

## 2013-06-23 LAB — RETICULOCYTES (CHCC)
ABS Retic: 505.8 10*3/uL — ABNORMAL HIGH (ref 19.0–186.0)
RBC.: 2.89 MIL/uL — ABNORMAL LOW (ref 3.87–5.11)
Retic Ct Pct: 17.5 % — ABNORMAL HIGH (ref 0.4–2.3)

## 2013-06-23 LAB — TECHNOLOGIST REVIEW

## 2013-06-23 LAB — CBC & DIFF AND RETIC
BASO%: 0.7 % (ref 0.0–2.0)
Basophils Absolute: 0.1 10*3/uL (ref 0.0–0.1)
EOS ABS: 0.4 10*3/uL (ref 0.0–0.5)
EOS%: 2.8 % (ref 0.0–7.0)
HCT: 23.8 % — ABNORMAL LOW (ref 34.8–46.6)
HGB: 8.5 g/dL — ABNORMAL LOW (ref 11.6–15.9)
LYMPH%: 17.6 % (ref 14.0–49.7)
MCH: 30.5 pg (ref 25.1–34.0)
MCHC: 35.6 g/dL (ref 31.5–36.0)
MCV: 85.6 fL (ref 79.5–101.0)
MONO#: 2 10*3/uL — ABNORMAL HIGH (ref 0.1–0.9)
MONO%: 14.3 % — ABNORMAL HIGH (ref 0.0–14.0)
NEUT%: 64.6 % (ref 38.4–76.8)
NEUTROS ABS: 9 10*3/uL — AB (ref 1.5–6.5)
Platelets: 442 10*3/uL — ABNORMAL HIGH (ref 145–400)
RBC: 2.77 10*6/uL — AB (ref 3.70–5.45)
RDW: 23.7 % — AB (ref 11.2–14.5)
WBC: 13.9 10*3/uL — ABNORMAL HIGH (ref 3.9–10.3)
lymph#: 2.5 10*3/uL (ref 0.9–3.3)

## 2013-06-23 LAB — FERRITIN CHCC: FERRITIN: 691 ng/mL — AB (ref 9–269)

## 2013-06-23 LAB — HOLD TUBE, BLOOD BANK

## 2013-06-23 MED ORDER — FOLIC ACID 1 MG PO TABS: 5.0000 mg | ORAL_TABLET | Freq: Every day | ORAL | Status: DC

## 2013-06-23 NOTE — Progress Notes (Signed)
[facility] NOTE  Patient Care Team: Leana Gamer, MD as PCP - General (Internal Medicine) Leana Gamer, MD (Internal Medicine)  CHIEF COMPLAINTS/PURPOSE OF CONSULTATION:  Sickle cell anemia with pregnancy  HISTORY OF PRESENTING ILLNESS:  [name] 43 y.o. 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 According to the patient, she is pregnant again, with a due date expected on 02/02/2014. Previously, when she was pregnant with her daughter, she had severe hypertension, and intrauterine growth retardation of her daughter and her baby was delivered with C-section at 24 weeks. Her daughter is currently healthy. With her sickle cell disease, she had recurrent admission to the hospital 2-3 times a year for painful chest crisis. She was also known to have avascular necrosis of hips but never required any joint surgery. She never developed history of stroke. She had numerous transfusions in the past without complications. The patient does not take hydroxyurea when she was not pregnant. She was found to have abnormal CBC from her routine blood work recently, the last time she had exchange transfusion 2 weeks ago for anemia of 7.9 g She denies recent chest pain on exertion, shortness of breath on minimal exertion, pre-syncopal episodes, or palpitations. She had not noticed any recent bleeding such as epistaxis, hematuria or hematochezia The patient denies over the counter NSAID ingestion. She is not on antiplatelets agents. She is currently working full-time. MEDICAL HISTORY:  Past Medical History  Diagnosis Date  . Sickle cell disease   . Anemia 03/30/2012    Hx of sickle cell disease  . CAP (community acquired pneumonia)   . Leg ulcer 10/27/2012    Chronic under care of wound clinic  . Cholelithiasis y-1  . H/O miscarriage, currently  pregnant     SURGICAL HISTORY: Past Surgical History  Procedure Laterality Date  . Cesarean section      SOCIAL HISTORY: History   Social History  . Marital Status: Married    Spouse Name: N/A    Number of Children: N/A  . Years of Education: N/A   Occupational History  . Not on file.   Social History Main Topics  . Smoking status: Never Smoker   . Smokeless tobacco: Never Used  . Alcohol Use: No  . Drug Use: No  . Sexual Activity: Yes   Other Topics Concern  . Not on file   Social History Narrative  . No narrative on file    FAMILY HISTORY: Family History  Problem Relation Age of Onset  . Sickle cell anemia Sister   . Diabetes Mother     ALLERGIES:  has No Known Allergies.  MEDICATIONS:  Current Outpatient Prescriptions  Medication Sig Dispense Refill  . folic acid (FOLVITE) 1 MG tablet Take 2 tablets (2 mg total) by mouth daily.  30 tablet  8  . oxyCODONE (OXY IR/ROXICODONE) 5 MG immediate release tablet Take 1 tablet (5 mg total) by mouth every 4 (four) hours as needed.  90 tablet  0  . Prenatal Vit-Fe Fumarate-FA (PRENATAL PO) Take 1 tablet by mouth daily.      . folic acid (FOLVITE) 1 MG tablet Take 5 tablets (5 mg total) by mouth daily. Please dispense higher strengths if available  300 tablet  3   No current facility-administered medications for this visit.    REVIEW OF SYSTEMS:   Constitutional: Denies fevers,  chills or abnormal night sweats Eyes: Denies blurriness of vision, double vision or watery eyes Ears, nose, mouth, throat, and face: Denies mucositis or sore throat Respiratory: Denies cough, dyspnea or wheezes Cardiovascular: Denies palpitation, chest discomfort or lower extremity swelling Gastrointestinal:  Denies nausea, heartburn or change in bowel habits Skin: Denies abnormal skin rashes Lymphatics: Denies new lymphadenopathy or easy bruising Neurological:Denies numbness, tingling or new weaknesses Behavioral/Psych: Mood is stable,  no new changes  All other systems were reviewed with the patient and are negative.  PHYSICAL EXAMINATION: ECOG PERFORMANCE STATUS: 0 - Asymptomatic  Filed Vitals:   06/23/13 0952  BP: 114/62  Pulse: 75  Temp: 98.1 F (36.7 C)  Resp: 18   Filed Weights   06/23/13 0952  Weight: 133 lb 3.2 oz (60.419 kg)    GENERAL:alert, no distress and comfortable SKIN: skin color, texture, turgor are normal, no rashes or significant lesions. She looked pale EYES: normal, conjunctiva are pale with mild sclera icterus and non-injected, OROPHARYNX:no exudate, no erythema and lips, buccal mucosa, and tongue normal  NECK: supple, thyroid normal size, non-tender, without nodularity LYMPH:  no palpable lymphadenopathy in the cervical, axillary or inguinal LUNGS: clear to auscultation and percussion with normal breathing effort HEART: regular rate & rhythm and no murmurs and no lower extremity edema ABDOMEN:abdomen soft, non-tender and normal bowel sounds unable to appreciate abnormalities but consistent with pregnancy Musculoskeletal:no cyanosis of digits and no clubbing  PSYCH: alert & oriented x 3 with fluent speech NEURO: no focal motor/sensory deficits  LABORATORY DATA:  I have reviewed the data as listed Lab Results  Component Value Date   WBC 13.9* 06/23/2013   HGB 8.5* 06/23/2013   HCT 23.8* 06/23/2013   MCV 85.6 06/23/2013   PLT 442* 06/23/2013   ASSESSMENT:  Sickle cell anemia  PLAN #1 sickle cell anemia #2 high risk pregnancy with history of multiple miscarriages and history of intrauterine growth retardation I recommend she return here for blood work every 2 weeks and to see me every 4 weeks. Closer to her third trimester, I will see her on a weekly basis. I establish transfusion threshold to give her blood transfusion if her hemoglobin dropped to less than 8 g. I would not recommend exchange transfusion in this situation. The risks, benefits and side effects of blood transfusion were  discussed with the patient. I recommend increasing her folic acid to 5 mg daily #3 leukocytosis This could be related to pregnancy. I will observe. #4 mild thrombocytosis This could be related to pregnancy. I will observe. #5 history of pregnancy-induced hypertension I will want monitor blood pressure carefully here.  I have ordered a stat CBC my office and her hemoglobin today is 8.5. She will return in 2 weeks for blood work as above.

## 2013-06-23 NOTE — Progress Notes (Signed)
Checked in new patient with no financial issues. She has appt card. °

## 2013-06-23 NOTE — Telephone Encounter (Signed)
appts made and printed...td 

## 2013-06-25 LAB — HEMOGLOBINOPATHY EVALUATION
HGB A: 27.1 % — AB (ref 96.8–97.8)
Hemoglobin Other: 0 %
Hgb A2 Quant: 3.1 % (ref 2.2–3.2)
Hgb F Quant: 6.3 % — ABNORMAL HIGH (ref 0.0–2.0)
Hgb S Quant: 63.5 % — ABNORMAL HIGH

## 2013-07-07 ENCOUNTER — Encounter: Payer: PRIVATE HEALTH INSURANCE | Admitting: Advanced Practice Midwife

## 2013-07-07 ENCOUNTER — Telehealth: Payer: Self-pay | Admitting: *Deleted

## 2013-07-07 ENCOUNTER — Other Ambulatory Visit (HOSPITAL_BASED_OUTPATIENT_CLINIC_OR_DEPARTMENT_OTHER): Payer: Medicaid Other

## 2013-07-07 DIAGNOSIS — D473 Essential (hemorrhagic) thrombocythemia: Secondary | ICD-10-CM

## 2013-07-07 DIAGNOSIS — D571 Sickle-cell disease 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: Secondary | ICD-10-CM

## 2013-07-07 DIAGNOSIS — Z3490 Encounter for supervision of normal pregnancy, unspecified, unspecified trimester: Secondary | ICD-10-CM

## 2013-07-07 DIAGNOSIS — O99891 Other specified diseases and conditions complicating pregnancy: Secondary | ICD-10-CM

## 2013-07-07 DIAGNOSIS — O9989 Other specified diseases and conditions complicating pregnancy, childbirth and the puerperium: Secondary | ICD-10-CM

## 2013-07-07 LAB — CBC & DIFF AND RETIC
BASO%: 0.8 % (ref 0.0–2.0)
Basophils Absolute: 0.1 10*3/uL (ref 0.0–0.1)
EOS ABS: 0.3 10*3/uL (ref 0.0–0.5)
EOS%: 1.6 % (ref 0.0–7.0)
HCT: 23.9 % — ABNORMAL LOW (ref 34.8–46.6)
HGB: 8.4 g/dL — ABNORMAL LOW (ref 11.6–15.9)
LYMPH%: 10.7 % — ABNORMAL LOW (ref 14.0–49.7)
MCH: 30.3 pg (ref 25.1–34.0)
MCHC: 35 g/dL (ref 31.5–36.0)
MCV: 86.7 fL (ref 79.5–101.0)
MONO#: 2.5 10*3/uL — AB (ref 0.1–0.9)
MONO%: 15.4 % — ABNORMAL HIGH (ref 0.0–14.0)
NEUT#: 11.6 10*3/uL — ABNORMAL HIGH (ref 1.5–6.5)
NEUT%: 71.5 % (ref 38.4–76.8)
NRBC: 7 % — AB (ref 0–0)
Platelets: 439 10*3/uL — ABNORMAL HIGH (ref 145–400)
RBC: 2.76 10*6/uL — AB (ref 3.70–5.45)
RDW: 24.9 % — AB (ref 11.2–14.5)
WBC: 16.3 10*3/uL — ABNORMAL HIGH (ref 3.9–10.3)
lymph#: 1.7 10*3/uL (ref 0.9–3.3)

## 2013-07-07 LAB — RETICULOCYTES (CHCC)
ABS Retic: 536.7 10*3/uL — ABNORMAL HIGH (ref 19.0–186.0)
RBC.: 2.87 MIL/uL — AB (ref 3.87–5.11)
Retic Ct Pct: 18.7 % — ABNORMAL HIGH (ref 0.4–2.3)

## 2013-07-07 LAB — HOLD TUBE, BLOOD BANK

## 2013-07-07 NOTE — Telephone Encounter (Signed)
Message copied by Cathlean Cower on Wed Jul 07, 2013 10:21 AM ------      Message from: [facility], [place]: Wed Jul 07, 2013 10:15 AM      Regarding: no need blood                   ----- Message -----         From: [facility] Interface         Sent: 07/07/2013  10:14 AM           To: Heath Lark, MD             ------

## 2013-07-07 NOTE — Telephone Encounter (Signed)
Left VM for pt informing no need for transfusion today.  Keep lab appt in 2 weeks as scheduled and call back if any questions.

## 2013-07-12 ENCOUNTER — Other Ambulatory Visit: Payer: Self-pay | Admitting: Internal Medicine

## 2013-07-12 DIAGNOSIS — D571 Sickle-cell disease 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: Secondary | ICD-10-CM

## 2013-07-12 DIAGNOSIS — O0990 Supervision of high risk pregnancy, unspecified, unspecified trimester: Secondary | ICD-10-CM

## 2013-07-14 ENCOUNTER — Ambulatory Visit: Payer: PRIVATE HEALTH INSURANCE | Admitting: Internal Medicine

## 2013-07-15 ENCOUNTER — Inpatient Hospital Stay (HOSPITAL_COMMUNITY)
Admission: EM | Admit: 2013-07-15 | Discharge: 2013-07-16 | DRG: 781 | Disposition: A | Discharge status: Home / Self Care | Payer: Medicaid Other | Attending: Internal Medicine | Admitting: Internal Medicine

## 2013-07-15 ENCOUNTER — Emergency Department (HOSPITAL_COMMUNITY): Payer: Medicaid Other

## 2013-07-15 ENCOUNTER — Ambulatory Visit: Payer: PRIVATE HEALTH INSURANCE | Admitting: Internal Medicine

## 2013-07-15 ENCOUNTER — Encounter (HOSPITAL_COMMUNITY): Payer: Self-pay | Admitting: Emergency Medicine

## 2013-07-15 DIAGNOSIS — Z833 Family history of diabetes mellitus: Secondary | ICD-10-CM

## 2013-07-15 DIAGNOSIS — O09299 Supervision of pregnancy with other poor reproductive or obstetric history, unspecified trimester: Secondary | ICD-10-CM

## 2013-07-15 DIAGNOSIS — O99019 Anemia complicating pregnancy, unspecified trimester: Principal | ICD-10-CM | POA: Diagnosis present

## 2013-07-15 DIAGNOSIS — IMO0002 Reserved for concepts with insufficient information to code with codable children: Secondary | ICD-10-CM

## 2013-07-15 DIAGNOSIS — O09529 Supervision of elderly multigravida, unspecified trimester: Secondary | ICD-10-CM

## 2013-07-15 DIAGNOSIS — D5700 Hb-SS disease with crisis, unspecified: Secondary | ICD-10-CM | POA: Diagnosis present

## 2013-07-15 DIAGNOSIS — D571 Sickle-cell disease 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: Secondary | ICD-10-CM | POA: Insufficient documentation

## 2013-07-15 LAB — CBC WITH DIFFERENTIAL/PLATELET
BAND NEUTROPHILS: 0 % (ref 0–10)
BASOS ABS: 0 10*3/uL (ref 0.0–0.1)
BASOS PCT: 0 % (ref 0–1)
Blasts: 0 %
EOS ABS: 0.2 10*3/uL (ref 0.0–0.7)
EOS PCT: 1 % (ref 0–5)
HEMATOCRIT: 20 % — AB (ref 36.0–46.0)
Hemoglobin: 7.4 g/dL — ABNORMAL LOW (ref 12.0–15.0)
Lymphocytes Relative: 10 % — ABNORMAL LOW (ref 12–46)
Lymphs Abs: 1.8 10*3/uL (ref 0.7–4.0)
MCH: 30.1 pg (ref 26.0–34.0)
MCHC: 37 g/dL — ABNORMAL HIGH (ref 30.0–36.0)
MCV: 81.3 fL (ref 78.0–100.0)
METAMYELOCYTES PCT: 0 %
MONO ABS: 2.4 10*3/uL — AB (ref 0.1–1.0)
MYELOCYTES: 0 %
Monocytes Relative: 13 % — ABNORMAL HIGH (ref 3–12)
NRBC: 7 /100{WBCs} — AB
Neutro Abs: 13.9 10*3/uL — ABNORMAL HIGH (ref 1.7–7.7)
Neutrophils Relative %: 76 % (ref 43–77)
PLATELETS: 384 10*3/uL (ref 150–400)
Promyelocytes Absolute: 0 %
RBC: 2.46 MIL/uL — AB (ref 3.87–5.11)
RDW: 23 % — ABNORMAL HIGH (ref 11.5–15.5)
WBC: 18.3 10*3/uL — ABNORMAL HIGH (ref 4.0–10.5)

## 2013-07-15 LAB — RETICULOCYTES
RBC.: 2.46 MIL/uL — AB (ref 3.87–5.11)
Retic Ct Pct: >23 % — ABNORMAL HIGH (ref 0.4–3.1)

## 2013-07-15 LAB — I-STAT CHEM 8, ED
BUN: <3 mg/dL — ABNORMAL LOW (ref 6–23)
CALCIUM ION: 1.18 mmol/L (ref 1.12–1.23)
Chloride: 101 mEq/L (ref 96–112)
Creatinine, Ser: 0.3 mg/dL — ABNORMAL LOW (ref 0.50–1.10)
Glucose, Bld: 96 mg/dL (ref 70–99)
HEMATOCRIT: 23 % — AB (ref 36.0–46.0)
Hemoglobin: 7.8 g/dL — ABNORMAL LOW (ref 12.0–15.0)
Potassium: 3.3 mEq/L — ABNORMAL LOW (ref 3.7–5.3)
Sodium: 138 mEq/L (ref 137–147)
TCO2: 20 mmol/L (ref 0–100)

## 2013-07-15 LAB — URINALYSIS, ROUTINE W REFLEX MICROSCOPIC
BILIRUBIN URINE: NEGATIVE
Glucose, UA: NEGATIVE mg/dL
HGB URINE DIPSTICK: NEGATIVE
Ketones, ur: NEGATIVE mg/dL
Leukocytes, UA: NEGATIVE
Nitrite: NEGATIVE
Protein, ur: NEGATIVE mg/dL
SPECIFIC GRAVITY, URINE: 1.007 (ref 1.005–1.030)
Urobilinogen, UA: 0.2 mg/dL (ref 0.0–1.0)
pH: 7 (ref 5.0–8.0)

## 2013-07-15 LAB — HCG, QUANTITATIVE, PREGNANCY: hCG, Beta Chain, Quant, S: 32857 m[IU]/mL — ABNORMAL HIGH (ref <5)

## 2013-07-15 LAB — PREGNANCY, URINE: Preg Test, Ur: POSITIVE — AB

## 2013-07-15 LAB — RAPID STREP SCREEN (MED CTR MEBANE ONLY): Streptococcus, Group A Screen (Direct): NEGATIVE

## 2013-07-15 LAB — CREATININE, SERUM
Creatinine, Ser: 0.31 mg/dL — ABNORMAL LOW (ref 0.50–1.10)
GFR calc Af Amer: >90 mL/min (ref >90)
GFR calc non Af Amer: >90 mL/min (ref >90)

## 2013-07-15 LAB — PREPARE RBC (CROSSMATCH)

## 2013-07-15 MED ORDER — ENOXAPARIN SODIUM 40 MG/0.4ML ~~LOC~~ SOLN
40.0000 mg | SUBCUTANEOUS | Status: DC
  Administered 2013-07-15: 40 mg via SUBCUTANEOUS
  Filled 2013-07-15 (×2): qty 0.4

## 2013-07-15 MED ORDER — ONDANSETRON HCL 4 MG/2ML IJ SOLN
4.0000 mg | Freq: Once | INTRAMUSCULAR | Status: AC
  Administered 2013-07-15: 4 mg via INTRAVENOUS
  Filled 2013-07-15: qty 2

## 2013-07-15 MED ORDER — PRENATAL 27-0.8 MG PO TABS
1.0000 | ORAL_TABLET | Freq: Every day | ORAL | Status: DC
  Administered 2013-07-16: 1 via ORAL
  Filled 2013-07-15 (×2): qty 1

## 2013-07-15 MED ORDER — NALOXONE HCL 0.4 MG/ML IJ SOLN: 0.4000 mg | INTRAMUSCULAR | Status: DC | PRN

## 2013-07-15 MED ORDER — HYDROMORPHONE 0.3 MG/ML IV SOLN
INTRAVENOUS | Status: DC
  Administered 2013-07-15: 1.5 mg via INTRAVENOUS
  Administered 2013-07-15: 1.8 mg via INTRAVENOUS
  Administered 2013-07-15: 0.9 mg via INTRAVENOUS
  Administered 2013-07-16: 11:00:00 via INTRAVENOUS
  Administered 2013-07-16: 0.6 mg via INTRAVENOUS
  Administered 2013-07-16 (×2): 0.3 mg via INTRAVENOUS
  Filled 2013-07-15 (×2): qty 25

## 2013-07-15 MED ORDER — ALUM & MAG HYDROXIDE-SIMETH 200-200-20 MG/5ML PO SUSP
30.0000 mL | Freq: Four times a day (QID) | ORAL | Status: DC | PRN
  Filled 2013-07-15: qty 30

## 2013-07-15 MED ORDER — ACETAMINOPHEN 650 MG RE SUPP: 650.0000 mg | Freq: Four times a day (QID) | RECTAL | Status: DC | PRN

## 2013-07-15 MED ORDER — DIPHENHYDRAMINE HCL 12.5 MG/5ML PO ELIX: 12.5000 mg | ORAL_SOLUTION | Freq: Four times a day (QID) | ORAL | Status: DC | PRN

## 2013-07-15 MED ORDER — ONDANSETRON HCL 4 MG/2ML IJ SOLN
4.0000 mg | Freq: Four times a day (QID) | INTRAMUSCULAR | Status: DC | PRN
  Administered 2013-07-15 – 2013-07-16 (×2): 4 mg via INTRAVENOUS
  Filled 2013-07-15 (×2): qty 2

## 2013-07-15 MED ORDER — ACETAMINOPHEN 325 MG PO TABS: 650.0000 mg | ORAL_TABLET | Freq: Four times a day (QID) | ORAL | Status: DC | PRN

## 2013-07-15 MED ORDER — SODIUM CHLORIDE 0.9 % IJ SOLN: 9.0000 mL | INTRAMUSCULAR | Status: DC | PRN

## 2013-07-15 MED ORDER — SODIUM CHLORIDE 0.45 % IV SOLN
INTRAVENOUS | Status: DC
  Administered 2013-07-15: 11:00:00 via INTRAVENOUS

## 2013-07-15 MED ORDER — POTASSIUM CHLORIDE CRYS ER 20 MEQ PO TBCR
40.0000 meq | EXTENDED_RELEASE_TABLET | Freq: Once | ORAL | Status: DC
  Filled 2013-07-15: qty 2

## 2013-07-15 MED ORDER — OXYCODONE HCL 5 MG PO TABS: 5.0000 mg | ORAL_TABLET | ORAL | Status: DC | PRN

## 2013-07-15 MED ORDER — FOLIC ACID 1 MG PO TABS
5.0000 mg | ORAL_TABLET | Freq: Every day | ORAL | Status: DC
  Administered 2013-07-16: 5 mg via ORAL
  Filled 2013-07-15 (×2): qty 5

## 2013-07-15 MED ORDER — DIPHENHYDRAMINE HCL 50 MG/ML IJ SOLN: 12.5000 mg | Freq: Four times a day (QID) | INTRAMUSCULAR | Status: DC | PRN

## 2013-07-15 MED ORDER — HYDROMORPHONE HCL PF 1 MG/ML IJ SOLN
1.0000 mg | Freq: Once | INTRAMUSCULAR | Status: AC
  Administered 2013-07-15: 1 mg via INTRAVENOUS
  Filled 2013-07-15: qty 1

## 2013-07-15 NOTE — ED Notes (Signed)
Pt placed on 1 lpm [place].

## 2013-07-15 NOTE — H&P (Addendum)
History and Physical  [name] NWG:[id number] DOB: [birth year] DOA: 07/15/2013  Referring physician: Dr. Lars Masson PCP: [name],[name], MD   Chief Complaint: Sickle cell pain   History of Present Illness: [name] is an 43 y.o. female with a PMH of Hb SS, [redacted] weeks pregnant, who presented to the hospital with her typical sickle cell pain, mostly in her left leg and right lower quadrant.  She typically has a crisis 2-3 times a year requiring hospitalization. She was not on hydroxyurea prior to her pregnancy. She had an exchange transfusion 06/11/13, and saw Dr. Alvy Bimler 06/23/13 who recommended maintaining a hemoglobin level of at least 8 mg/dL. Pain was rated 10/10 at worst, currently 7/10.  No associated chest pain, shortness of breath, fever or chills.  She has had a sore throat for several days and some nausea but no vomiting.  Has also had a headache.  Review of Systems: Constitutional: No fever, no chills;  Appetite normal; No weight loss, no weight gain, no fatigue.  HEENT: No blurry vision, no diplopia, + pharyngitis, no dysphagia CV: No chest pain, no palpitations, no PND.  Resp: No SOB, no cough, no pleuritic pain. GI: + nausea, no vomiting, no diarrhea, no melena, no hematochezia, no constipation.  GU: No dysuria, no hematuria, no frequency, no urgency. MSK: + myalgias, + arthralgias.  Neuro:  + headache, no focal neurological deficits, no history of seizures.  Psych: No depression, no anxiety.  Endo: No heat intolerance, no cold intolerance, no polyuria, no polydipsia  Skin: No rashes, no skin lesions.  Heme: No easy bruising.   Past Medical History Past Medical History  Diagnosis Date  . Sickle cell disease   . Anemia 03/30/2012    Hx of sickle cell disease  . CAP (community acquired pneumonia)   . Leg ulcer 10/27/2012    Chronic under care of wound clinic  . Cholelithiasis y-1  . H/O miscarriage, currently pregnant      Past Surgical History Past Surgical  History  Procedure Laterality Date  . Cesarean section       Social History: History   Social History  . Marital Status: Married    Spouse Name: [name]    Number of Children: 1  . Years of Education: N/A   Occupational History  . Employed in home care.    Social History Main Topics  . Smoking status: Never Smoker   . Smokeless tobacco: Never Used  . Alcohol Use: No  . Drug Use: No  . Sexual Activity: Yes   Other Topics Concern  . Not on file   Social History Narrative   Lives with husband.    Family History:  Family History  Problem Relation Age of Onset  . Sickle cell anemia Sister   . Diabetes Mother     Allergies: Review of patient's allergies indicates no known allergies.  Meds: Prior to Admission medications   Medication Sig Start Date End Date Taking? Authorizing Provider  folic acid (FOLVITE) 1 MG tablet Take 5 tablets (5 mg total) by mouth daily. Please dispense higher strengths if available 06/23/13  Yes Heath Lark, MD  oxyCODONE (OXY IR/ROXICODONE) 5 MG immediate release tablet Take 1 tablet (5 mg total) by mouth every 4 (four) hours as needed. 05/07/13  Yes Leana Gamer, MD  Prenatal Vit-Fe Fumarate-FA (PRENATAL PO) Take 1 tablet by mouth daily.   Yes Historical Provider, MD    Physical Exam: Filed Vitals:   07/15/13 0817 07/15/13 0900  07/15/13 0930 07/15/13 1004  BP:  103/56 99/56 116/61  Pulse:  69 64 71  Temp:    98.5 F (36.9 C)  TempSrc:    Oral  Resp:    16  Height:    5\' 7"  (1.702 m)  Weight:    61.054 kg (134 lb 9.6 oz)  SpO2: 96% 96% 96% 98%     Physical Exam: Blood pressure 116/61, pulse 71, temperature 98.5 F (36.9 C), temperature source Oral, resp. rate 16, height 5\' 7"  (1.702 m), weight 61.054 kg (134 lb 9.6 oz), last menstrual period 04/28/2013, SpO2 98.00%. Gen: No acute distress. Head: Normocephalic, atraumatic. Eyes: PERRL, EOMI, sclerae nonicteric. Mouth: Oropharynx clear but posterior pharynx  mildly erythematous. Neck: Supple, no thyromegaly, no lymphadenopathy, no jugular venous distention. Chest: Lungs clear to auscultation bilaterally with good air movement. CV: Heart sounds are regular with a hyperdynamic precordium and a soft flow murmur. Abdomen: Soft, nontender, nondistended with normal active bowel sounds. Extremities: Extremities without clubbing, edema, or cyanosis. Skin: Warm and dry. Neuro: Alert and oriented times 3; cranial nerves II through XII grossly intact. Psych: Mood and affect normal.  Labs on Admission:  Basic Metabolic Panel:  Recent Labs Lab 07/15/13 0559  NA 138  K 3.3*  CL 101  GLUCOSE 96  BUN <3*  CREATININE 0.30*   CBC:  Recent Labs Lab 07/15/13 0548 07/15/13 0559  WBC 18.3*  --   NEUTROABS 13.9*  --   HGB 7.4* 7.8*  HCT 20.0* 23.0*  MCV 81.3  --   PLT 384  --    Radiological Exams on Admission: US Ob Comp Less 14 Wks  07/15/2013   CLINICAL DATA:  Rule out ectopic pregnancy. Pelvic pain. Quantitative beta HCG 32,857. Gestational age [redacted] weeks 1 day per LMP.  EXAM: OBSTETRIC <14 WK ULTRASOUND  TECHNIQUE: Transabdominal ultrasound was performed for evaluation of the gestation as well as the maternal uterus and adnexal regions.  COMPARISON:  None.  FINDINGS: Intrauterine gestational sac: Single and within normal.  Yolk sac:  Not visualized.  Embryo:  Within normal.  Cardiac Activity: Within normal.  Heart Rate: 168 bpm  CRL:   4.43 cm  mm   11 w 2 d                  [place] EDC: 02/01/2014  No evidence of subchorionic hemorrhage.  Maternal uterus/adnexae: Ovaries are within normal.  No free fluid.  IMPRESSION: Single viable IUP with estimated gestational age [redacted] weeks 2 day.   Electronically Signed   By: Marin Olp M.D.   On: 07/15/2013 07:46    Assessment/Plan Principal Problem:   Hb-SS disease with crisis Patient was admitted and placed on PCA Dilaudid-HP for pain control. She will be gently hydrated with half-normal saline. Transfuse to  a hemoglobin of at least 8 mg/dL.  K-Pad ordered. Active Problems:   Anemia Seen by hematologist, Dr. Alvy Bimler, on 06/23/13 with recommendations to increase folic acid to 5 mg daily and to maintain a hemoglobin level of 8 mg/dL. As such, she will receive 1 unit of packed red blood cells today.   High risk pregnancy due to history of previous obstetrical problem OB ultrasound showed a single viable intrauterine pregnancy. We'll notify Dr. Alvy Bimler of the patient's admission.   DVT prophylaxis Lovenox has been ordered.  Code Status: Full. Family Communication: Adelene Idler [phone] Disposition Plan: Home when stable.  Time spent: 45 minutes.  [facility] Pager [phone] Cell: [phone]  If 7PM-7AM, please contact night-coverage [email or web address] Password [email or web address] 07/15/2013, 12:10 PM    **Disclaimer: This note was dictated with voice recognition software. Similar sounding words can inadvertently be transcribed and this note may contain transcription errors which may not have been corrected upon publication of note.** Patient admitted this am with sickle cell painful crisis. She has dropped her hemoglobin to 7.8 from 8.9 and is 11 months pregnant. Will assume care and continue current treatment.  Barbette Merino, MD

## 2013-07-15 NOTE — ED Provider Notes (Signed)
CSN: [id number]     Arrival date & time 07/15/13  0506 History   First MD Initiated Contact with Patient 07/15/13 [phone]     Chief Complaint  Patient presents with  . Sickle Cell Pain Crisis     (Consider location/radiation/quality/duration/timing/severity/associated sxs/prior Treatment) HPI Comments: Patient is a 43 year old G82P1 female with history of sickle cell disease who presents today with left leg pain. The pain began last night and has gradually worsened. It is a sharp pain that feels like her normal sick cell pain. She has taken her oxycodone without relief of her symptoms. The pain is worse in her knee and radiates both down and up her leg. She has also had intermittent right lower quadrant abdominal pain. The pain is sharp and intermittent coming multiple times since Monday. When the pain comes on it lasts for approximately 2 minutes. She cannot think of anything that triggers this pain. No nausea, vomiting, diarrhea, chest pain, shortness of breath, fevers, chills, vaginal bleeding, vaginal discharge. She does not have a confirmed IUP. Her appointment with OB is next week.   The history is provided by the patient. No language interpreter was used.    Past Medical History  Diagnosis Date  . Sickle cell disease   . Anemia 03/30/2012    Hx of sickle cell disease  . CAP (community acquired pneumonia)   . Leg ulcer 10/27/2012    Chronic under care of wound clinic  . Cholelithiasis y-1  . H/O miscarriage, currently pregnant    Past Surgical History  Procedure Laterality Date  . Cesarean section     Family History  Problem Relation Age of Onset  . Sickle cell anemia Sister   . Diabetes Mother    History  Substance Use Topics  . Smoking status: Never Smoker   . Smokeless tobacco: Never Used  . Alcohol Use: No   OB History   Grav Para Term Preterm Abortions TAB SAB Ect Mult Living   1              Review of Systems  Constitutional: Negative for fever and chills.    Respiratory: Negative for shortness of breath.   Cardiovascular: Negative for chest pain.  Gastrointestinal: Negative for nausea and vomiting.  Genitourinary: Positive for pelvic pain. Negative for vaginal bleeding, vaginal discharge and vaginal pain.  Musculoskeletal: Positive for arthralgias and myalgias.  All other systems reviewed and are negative.      Allergies  Review of patient's allergies indicates no known allergies.  Home Medications   Current Outpatient Rx  Name  Route  Sig  Dispense  Refill  . folic acid (FOLVITE) 1 MG tablet   Oral   Take 5 tablets (5 mg total) by mouth daily. Please dispense higher strengths if available   300 tablet   3   . oxyCODONE (OXY IR/ROXICODONE) 5 MG immediate release tablet   Oral   Take 1 tablet (5 mg total) by mouth every 4 (four) hours as needed.   90 tablet   0   . Prenatal Vit-Fe Fumarate-FA (PRENATAL PO)   Oral   Take 1 tablet by mouth daily.          BP 129/68  Pulse 93  Temp(Src) 98.2 F (36.8 C) (Oral)  Resp 20  Ht 5\' 7"  (1.702 m)  Wt 132 lb (59.875 kg)  BMI 20.67 kg/m2  SpO2 97%  LMP 04/28/2013 Physical Exam  Nursing note and vitals reviewed. Constitutional: She is oriented  to person, place, and time. She appears well-developed and well-nourished. She does not appear ill. No distress.  Patient appears to be laying comfortably in bed.   HENT:  Head: Normocephalic and atraumatic.  Right Ear: External ear normal.  Left Ear: External ear normal.  Nose: Nose normal.  Mouth/Throat: Oropharynx is clear and moist.  Eyes: Conjunctivae are normal.  Neck: Normal range of motion.  Cardiovascular: Normal rate, regular rhythm, normal heart sounds, intact distal pulses and normal pulses.   Pulses:      Radial pulses are 2+ on the right side, and 2+ on the left side.       Posterior tibial pulses are 2+ on the right side, and 2+ on the left side.  Pulmonary/Chest: Effort normal and breath sounds normal. No stridor.  No respiratory distress. She has no wheezes. She has no rales.  Abdominal: Soft. She exhibits no distension. There is tenderness in the right lower quadrant.    Musculoskeletal: Normal range of motion.  TTP diffusely over left leg, worse anteriorly. No swelling, erythema, streaking.   Neurological: She is alert and oriented to person, place, and time. She has normal strength.  Skin: Skin is warm and dry. She is not diaphoretic. No erythema.  Psychiatric: She has a normal mood and affect. Her behavior is normal.    ED Course  Procedures (including critical care time) Labs Review Labs Reviewed  PREGNANCY, URINE - Abnormal; Notable for the following:    Preg Test, Ur POSITIVE (*)    All other components within normal limits  CBC WITH DIFFERENTIAL - Abnormal; Notable for the following:    WBC 18.3 (*)    RBC 2.46 (*)    Hemoglobin 7.4 (*)    HCT 20.0 (*)    MCHC 37.0 (*)    RDW 23.0 (*)    Lymphocytes Relative 10 (*)    Monocytes Relative 13 (*)    nRBC 7 (*)    Neutro Abs 13.9 (*)    Monocytes Absolute 2.4 (*)    All other components within normal limits  RETICULOCYTES - Abnormal; Notable for the following:    Retic Ct Pct 28.5 (*)    RBC. 2.46 (*)    Retic Count, Manual 671.6 (*)    All other components within normal limits  HCG, QUANTITATIVE, PREGNANCY - Abnormal; Notable for the following:    hCG, Beta Chain, Quant, S 32857 (*)    All other components within normal limits  I-STAT CHEM 8, ED - Abnormal; Notable for the following:    Potassium 3.3 (*)    BUN <3 (*)    Creatinine, Ser 0.30 (*)    Hemoglobin 7.8 (*)    HCT 23.0 (*)    All other components within normal limits  URINALYSIS, ROUTINE W REFLEX MICROSCOPIC  POC URINE PREG, ED   Imaging Review US Ob Comp Less 14 Wks  07/15/2013   CLINICAL DATA:  Rule out ectopic pregnancy. Pelvic pain. Quantitative beta HCG 32,857. Gestational age [redacted] weeks 1 day per LMP.  EXAM: OBSTETRIC <14 WK ULTRASOUND  TECHNIQUE:  Transabdominal ultrasound was performed for evaluation of the gestation as well as the maternal uterus and adnexal regions.  COMPARISON:  None.  FINDINGS: Intrauterine gestational sac: Single and within normal.  Yolk sac:  Not visualized.  Embryo:  Within normal.  Cardiac Activity: Within normal.  Heart Rate: 168 bpm  CRL:   4.43 cm  mm   11 w 2 d  [place] EDC: 02/01/2014  No evidence of subchorionic hemorrhage.  Maternal uterus/adnexae: Ovaries are within normal.  No free fluid.  IMPRESSION: Single viable IUP with estimated gestational age [redacted] weeks 2 day.   Electronically Signed   By: Marin Olp M.D.   On: 07/15/2013 07:46    EKG Interpretation   None      6:30 AM Discussed case with Almyra Free from [facility] who recommends doppler of abdomen for heart tones and dilaudid for pain. If heart tones are heard no concern for ectopic.   8:33 AM Discussed case with Dr. Rockne Menghini who will discuss with Dr. Zigmund Daniel to determine if admission is best here or at [facility] hospital.   Dr. Rockne Menghini will admit patient, asked to call pharmacy to place on PCA. She will see patient when she gets to the floor.   8:45 AM Discussed case with pharmacy who recommend full dose PCA. No specific dosing at this time.    MDM   Final diagnoses:  None   Patient is a sickle cell patient who presents today for sickle cell pain. Her hgb has dropped a unit since 2/18. WBC has been gradually increasing over past month. Pain is consistent with sickle cell pain in leg. RLQ pain appears inguinal and is not consistent with appendicitis. Pelvic US shows viable pregnancy at 11 weeks and 2 days. Pain still 8/10 after 2mg  of dilaudid. Will admit patient and transfuse 1 unit of blood. Vital signs are stable at this time. Discussed case with Dr. Randal Buba who agrees with plan. Patient / Family / Caregiver informed of clinical course, understand medical decision-making process, and agree with plan.    Elwyn Lade,  PA-C 07/15/13 1001

## 2013-07-15 NOTE — ED Notes (Signed)
Pt in US at present time. 

## 2013-07-15 NOTE — ED Notes (Signed)
[place] notified of pending imaging

## 2013-07-15 NOTE — ED Notes (Signed)
Pt reports L lower leg pain that started at 7pm yesterday. Pt states that she has tried taking oxycodone for pain but no relief. Pt also states that she is having pain to RLQ of abdomen.

## 2013-07-16 LAB — TYPE AND SCREEN
ABO/RH(D): B POS
ANTIBODY SCREEN: NEGATIVE
Unit division: 0

## 2013-07-16 LAB — CBC
HCT: 22.4 % — ABNORMAL LOW (ref 36.0–46.0)
Hemoglobin: 8 g/dL — ABNORMAL LOW (ref 12.0–15.0)
MCH: 29.9 pg (ref 26.0–34.0)
MCHC: 35.7 g/dL (ref 30.0–36.0)
MCV: 83.6 fL (ref 78.0–100.0)
PLATELETS: 337 10*3/uL (ref 150–400)
RBC: 2.68 MIL/uL — AB (ref 3.87–5.11)
RDW: 20.8 % — AB (ref 11.5–15.5)
WBC: 14.8 10*3/uL — AB (ref 4.0–10.5)

## 2013-07-16 NOTE — Discharge Summary (Signed)
Physician Discharge Summary  Patient ID: [name] MRN: [id number] DOB/AGE: 1971-01-05 43 y.o.  Admit date: 07/15/2013 Discharge date: 07/16/2013  Admission Diagnoses:  Discharge Diagnoses:  Principal Problem:   Hb-SS disease with crisis Active Problems:   Anemia   Sickle cell crisis   High risk pregnancy due to history of previous obstetrical problem   Discharged Condition: good  Hospital Course: A 43 yo woman with known sickle cell disease admitted with sickle cell painful crisis, anemia and [redacted] weeks pregnant. Patient was on dilaudid PCA as well as her home meds. She also came in with a hemoglobin of 7.8 but is now 8.0 today. No evidence of active hemolysis. Her pain is now 2/10. She is therefore discharged home to follow up with her primary care physician as well as her OBGYN.  Consults: None  Significant Diagnostic Studies: labs: CBC and CMP. Stable numbers.  Treatments: IV hydration and analgesia: Dilaudid  Discharge Exam: Blood pressure 108/59, pulse 72, temperature 98.3 F (36.8 C), temperature source Oral, resp. rate 18, height 5\' 7"  (1.702 m), weight 61.054 kg (134 lb 9.6 oz), last menstrual period 04/28/2013, SpO2 95.00%. General appearance: alert, cooperative and no distress Eyes: conjunctivae/corneas clear. PERRL, EOM's intact. Fundi benign. Neck: no adenopathy, no carotid bruit, no JVD, supple, symmetrical, trachea midline and thyroid not enlarged, symmetric, no tenderness/mass/nodules Back: symmetric, no curvature. ROM normal. No CVA tenderness. Resp: clear to auscultation bilaterally Cardio: regular rate and rhythm, S1, S2 normal, no murmur, click, rub or gallop GI: soft, non-tender; bowel sounds normal; no masses,  no organomegaly Pulses: 2+ and symmetric Skin: Skin color, texture, turgor normal. No rashes or lesions Neurologic: Grossly normal  Disposition: 01-Home or Self Care   Future Appointments Provider Department Dept Phone   07/21/2013 10:30 AM  [facility] [phone]   07/22/2013 8:00 AM Joelyn Oms, [facility] [phone]   08/04/2013 9:30 AM Leana Gamer, MD [place] [phone]   08/04/2013 10:30 AM [facility] [phone]   08/18/2013 8:30 AM [facility] [phone]   08/18/2013 9:00 AM Heath Lark, MD [facility] [phone]   09/16/2013 9:30 AM [facility] [place] [facility] [phone]       Medication List         folic acid 1 MG tablet  Commonly known as:  FOLVITE  Take 5 tablets (5 mg total) by mouth daily. Please dispense higher strengths if available     oxyCODONE 5 MG immediate release tablet  Commonly known as:  Oxy IR/ROXICODONE  Take 1 tablet (5 mg total) by mouth every 4 (four) hours as needed.     PRENATAL PO  Take 1 tablet by mouth daily.         SignedBarbette Merino 07/16/2013, 1:48 PM

## 2013-07-16 NOTE — Progress Notes (Signed)
UR completed. Patient changed to inpatient- requiring IVF and IV PCA

## 2013-07-16 NOTE — Progress Notes (Signed)
07/16/13 1200  Clinical Encounter Type  Visited With Patient;Health care provider (RN)  Visit Type Initial;Spiritual support;Social support  Referral From Nurse;Chaplain  Spiritual Encounters  Spiritual Needs Emotional  Stress Factors  Patient Stress Factors Loss of control;Major life changes (coping with SS crisis while pregnant; 43yo dtr at home)   Visited with [name] to offer spiritual and emotional support as she copes with SS crisis/pain, hospital stay, being separated from her two-year-old daughter at home, and current pregnancy.  Per pt, she is originally from [place], lived in [place] for 8 years (where dtr was born), and has lived in [place] for almost 3 (upon marrying her husband).  Per pt, husband is her primary support.  Introduced Naval architect and chaplain availability as part of her team at both [facility] and [facility], anticipating that she may receive services at [facility] as pregnancy progresses/for L&D.  She was quiet, soft-spoken, appreciative.  She reports that having her daughter visit her in the hospital is complicated by dtr's separation anxiety and tearfulness at facing separation again.  Offered empathic listening and emotional support.  Please page [phone] for further support at [facility] (or [phone], in event of admission to [facility]).  Thank you.  [place], [place]

## 2013-07-17 NOTE — ED Provider Notes (Signed)
Medical screening examination/treatment/procedure(s) were performed by non-physician practitioner and as supervising physician I was immediately available for consultation/collaboration.   EKG Interpretation None       Jerrit Horen K Aletha Allebach-Rasch, MD 07/17/13 [phone]

## 2013-07-18 LAB — CULTURE, GROUP A STREP

## 2013-07-21 ENCOUNTER — Other Ambulatory Visit (HOSPITAL_BASED_OUTPATIENT_CLINIC_OR_DEPARTMENT_OTHER): Payer: Medicaid Other

## 2013-07-21 ENCOUNTER — Telehealth: Payer: Self-pay | Admitting: *Deleted

## 2013-07-21 ENCOUNTER — Other Ambulatory Visit: Payer: Self-pay | Admitting: *Deleted

## 2013-07-21 ENCOUNTER — Ambulatory Visit (HOSPITAL_BASED_OUTPATIENT_CLINIC_OR_DEPARTMENT_OTHER): Payer: Medicaid Other

## 2013-07-21 ENCOUNTER — Ambulatory Visit (HOSPITAL_COMMUNITY)
Admission: RE | Admit: 2013-07-21 | Discharge: 2013-07-21 | Disposition: A | Discharge status: Home / Self Care | Payer: Medicaid Other | Source: Ambulatory Visit | Attending: Hematology and Oncology | Admitting: Hematology and Oncology

## 2013-07-21 ENCOUNTER — Other Ambulatory Visit: Payer: Self-pay | Admitting: Hematology and Oncology

## 2013-07-21 VITALS — BP 100/60 | HR 90 | Temp 98.6°F | Resp 18

## 2013-07-21 DIAGNOSIS — D571 Sickle-cell disease 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: Secondary | ICD-10-CM | POA: Insufficient documentation

## 2013-07-21 DIAGNOSIS — D638 Anemia in other chronic diseases classified elsewhere: Secondary | ICD-10-CM

## 2013-07-21 DIAGNOSIS — D649 Anemia, unspecified: Secondary | ICD-10-CM

## 2013-07-21 DIAGNOSIS — O99019 Anemia complicating pregnancy, unspecified trimester: Secondary | ICD-10-CM | POA: Insufficient documentation

## 2013-07-21 DIAGNOSIS — Z3490 Encounter for supervision of normal pregnancy, unspecified, unspecified trimester: Secondary | ICD-10-CM

## 2013-07-21 LAB — CBC & DIFF AND RETIC
BASO%: 1.2 % (ref 0.0–2.0)
BASOS ABS: 0.2 10*3/uL — AB (ref 0.0–0.1)
EOS ABS: 0.5 10*3/uL (ref 0.0–0.5)
EOS%: 2.7 % (ref 0.0–7.0)
HCT: 23.5 % — ABNORMAL LOW (ref 34.8–46.6)
HEMOGLOBIN: 8 g/dL — AB (ref 11.6–15.9)
LYMPH#: 3.2 10*3/uL (ref 0.9–3.3)
LYMPH%: 17 % (ref 14.0–49.7)
MCH: 29.8 pg (ref 25.1–34.0)
MCHC: 34.3 g/dL (ref 31.5–36.0)
MCV: 87.1 fL (ref 79.5–101.0)
MONO#: 2.3 10*3/uL — ABNORMAL HIGH (ref 0.1–0.9)
MONO%: 12.1 % (ref 0.0–14.0)
NEUT#: 12.6 10*3/uL — ABNORMAL HIGH (ref 1.5–6.5)
NEUT%: 67 % (ref 38.4–76.8)
Platelets: 344 10*3/uL (ref 145–400)
RBC: 2.69 10*6/uL — AB (ref 3.70–5.45)
RDW: 21.5 % — AB (ref 11.2–14.5)
WBC: 18.9 10*3/uL — AB (ref 3.9–10.3)
nRBC: 4 % — ABNORMAL HIGH (ref 0–0)

## 2013-07-21 LAB — HOLD TUBE, BLOOD BANK

## 2013-07-21 LAB — RETICULOCYTES (CHCC)
ABS RETIC: 328.4 10*3/uL — AB (ref 19.0–186.0)
RBC.: 2.76 MIL/uL — ABNORMAL LOW (ref 3.87–5.11)
RETIC CT PCT: 11.9 % — AB (ref 0.4–2.3)

## 2013-07-21 MED ORDER — ACETAMINOPHEN 325 MG PO TABS
ORAL_TABLET | ORAL | Status: AC
  Filled 2013-07-21: qty 2

## 2013-07-21 MED ORDER — ACETAMINOPHEN 325 MG PO TABS
650.0000 mg | ORAL_TABLET | Freq: Once | ORAL | Status: AC
Start: 2013-07-21 — End: 2013-07-21
  Administered 2013-07-21: 650 mg via ORAL

## 2013-07-21 MED ORDER — SODIUM CHLORIDE 0.9 % IV SOLN
250.0000 mL | Freq: Once | INTRAVENOUS | Status: AC
  Administered 2013-07-21: 250 mL via INTRAVENOUS

## 2013-07-21 NOTE — Patient Instructions (Signed)
Blood Transfusion Information WHAT IS A BLOOD TRANSFUSION? A transfusion is the replacement of blood or some of its parts. Blood is made up of multiple cells which provide different functions.  Red blood cells carry oxygen and are used for blood loss replacement.  White blood cells fight against infection.  Platelets control bleeding.  Plasma helps clot blood.  Other blood products are available for specialized needs, such as hemophilia or other clotting disorders. BEFORE THE TRANSFUSION  Who gives blood for transfusions?   You may be able to donate blood to be used at a later date on yourself (autologous donation).  Relatives can be asked to donate blood. This is generally not any safer than if you have received blood from a stranger. The same precautions are taken to ensure safety when a relative's blood is donated.  Healthy volunteers who are fully evaluated to make sure their blood is safe. This is blood bank blood. Transfusion therapy is the safest it has ever been in the practice of medicine. Before blood is taken from a donor, a complete history is taken to make sure that person has no history of diseases nor engages in risky social behavior (examples are intravenous drug use or sexual activity with multiple partners). The donor's travel history is screened to minimize risk of transmitting infections, such as malaria. The donated blood is tested for signs of infectious diseases, such as HIV and hepatitis. The blood is then tested to be sure it is compatible with you in order to minimize the chance of a transfusion reaction. If you or a relative donates blood, this is often done in anticipation of surgery and is not appropriate for emergency situations. It takes many days to process the donated blood. RISKS AND COMPLICATIONS Although transfusion therapy is very safe and saves many lives, the main dangers of transfusion include:   Getting an infectious disease.  Developing a  transfusion reaction. This is an allergic reaction to something in the blood you were given. Every precaution is taken to prevent this. The decision to have a blood transfusion has been considered carefully by your caregiver before blood is given. Blood is not given unless the benefits outweigh the risks. AFTER THE TRANSFUSION  Right after receiving a blood transfusion, you will usually feel much better and more energetic. This is especially true if your red blood cells have gotten low (anemic). The transfusion raises the level of the red blood cells which carry oxygen, and this usually causes an energy increase.  The nurse administering the transfusion will monitor you carefully for complications. HOME CARE INSTRUCTIONS  No special instructions are needed after a transfusion. You may find your energy is better. Speak with your caregiver about any limitations on activity for underlying diseases you may have. SEEK MEDICAL CARE IF:   Your condition is not improving after your transfusion.  You develop redness or irritation at the intravenous (IV) site. SEEK IMMEDIATE MEDICAL CARE IF:  Any of the following symptoms occur over the next 12 hours:  Shaking chills.  You have a temperature by mouth above 102 F (38.9 C), not controlled by medicine.  Chest, back, or muscle pain.  People around you feel you are not acting correctly or are confused.  Shortness of breath or difficulty breathing.  Dizziness and fainting.  You get a rash or develop hives.  You have a decrease in urine output.  Your urine turns a dark color or changes to pink, red, or brown. Any of the following   symptoms occur over the next 10 days:  You have a temperature by mouth above 102 F (38.9 C), not controlled by medicine.  Shortness of breath.  Weakness after normal activity.  The white part of the eye turns yellow (jaundice).  You have a decrease in the amount of urine or are urinating less often.  Your  urine turns a dark color or changes to pink, red, or brown. Document Released: 05/03/2000 Document Revised: 07/29/2011 Document Reviewed: 12/21/2007 ExitCare Patient Information 2014 [employer].  

## 2013-07-21 NOTE — Telephone Encounter (Signed)
Instructed pt Dr. Alvy Bimler ordered 2 units of blood transfused today for Hgb 8.0.  Asked pt if she can come back to cancer center now.  She states will be back here in about 30 minutes.  Lab and blood bank notified.

## 2013-07-21 NOTE — Progress Notes (Signed)
1440 Patient noted to have temperature of 99.5. Dr. Alvy Bimler notified. Order given and carried out for 650 mg Tylenol PO. Recheck in 30 minutes.  1530 Temperature 100.0, Dr. Alvy Bimler notified proceed with second unit of blood per Dr. Alvy Bimler.

## 2013-07-22 ENCOUNTER — Ambulatory Visit (INDEPENDENT_AMBULATORY_CARE_PROVIDER_SITE_OTHER): Payer: Medicaid Other | Admitting: Family

## 2013-07-22 ENCOUNTER — Telehealth: Payer: Self-pay | Admitting: Hematology

## 2013-07-22 ENCOUNTER — Other Ambulatory Visit (HOSPITAL_COMMUNITY)
Admission: RE | Admit: 2013-07-22 | Discharge: 2013-07-22 | Disposition: A | Discharge status: Home / Self Care | Payer: Medicaid Other | Source: Ambulatory Visit | Attending: Family | Admitting: Family

## 2013-07-22 ENCOUNTER — Encounter: Payer: Self-pay | Admitting: Family

## 2013-07-22 VITALS — BP 118/72 | Wt 136.3 lb

## 2013-07-22 DIAGNOSIS — O09219 Supervision of pregnancy with history of pre-term labor, unspecified trimester: Secondary | ICD-10-CM

## 2013-07-22 DIAGNOSIS — O09299 Supervision of pregnancy with other poor reproductive or obstetric history, unspecified trimester: Secondary | ICD-10-CM | POA: Insufficient documentation

## 2013-07-22 DIAGNOSIS — O99019 Anemia complicating pregnancy, unspecified trimester: Secondary | ICD-10-CM

## 2013-07-22 DIAGNOSIS — O09899 Supervision of other high risk pregnancies, unspecified trimester: Secondary | ICD-10-CM | POA: Insufficient documentation

## 2013-07-22 DIAGNOSIS — D571 Sickle-cell disease 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: Secondary | ICD-10-CM

## 2013-07-22 DIAGNOSIS — O09529 Supervision of elderly multigravida, unspecified trimester: Secondary | ICD-10-CM

## 2013-07-22 DIAGNOSIS — Z1151 Encounter for screening for human papillomavirus (HPV): Secondary | ICD-10-CM | POA: Insufficient documentation

## 2013-07-22 DIAGNOSIS — O34219 Maternal care for unspecified type scar from previous cesarean delivery: Secondary | ICD-10-CM | POA: Insufficient documentation

## 2013-07-22 DIAGNOSIS — Z9889 Other specified postprocedural states: Secondary | ICD-10-CM

## 2013-07-22 DIAGNOSIS — Z01419 Encounter for gynecological examination (general) (routine) without abnormal findings: Secondary | ICD-10-CM | POA: Insufficient documentation

## 2013-07-22 DIAGNOSIS — Z98891 History of uterine scar from previous surgery: Secondary | ICD-10-CM

## 2013-07-22 DIAGNOSIS — O99012 Anemia complicating pregnancy, second trimester: Principal | ICD-10-CM

## 2013-07-22 DIAGNOSIS — Z113 Encounter for screening for infections with a predominantly sexual mode of transmission: Secondary | ICD-10-CM | POA: Insufficient documentation

## 2013-07-22 LAB — TYPE AND SCREEN
ABO/RH(D): B POS
Antibody Screen: NEGATIVE
Unit division: 0
Unit division: 0

## 2013-07-22 LAB — POCT URINALYSIS DIP (DEVICE)
Bilirubin Urine: NEGATIVE
Glucose, UA: NEGATIVE mg/dL
Ketones, ur: NEGATIVE mg/dL
LEUKOCYTES UA: NEGATIVE
Nitrite: NEGATIVE
PH: 7 (ref 5.0–8.0)
Protein, ur: NEGATIVE mg/dL
Specific Gravity, Urine: 1.015 (ref 1.005–1.030)
Urobilinogen, UA: 0.2 mg/dL (ref 0.0–1.0)

## 2013-07-22 LAB — HIV ANTIBODY (ROUTINE TESTING W REFLEX): HIV: NONREACTIVE

## 2013-07-22 NOTE — Progress Notes (Signed)
Pulse 79 Patient reports pelvic and back pain.  Needs Pap.  1hr today due to first degree relative (mother) has diabetes.

## 2013-07-22 NOTE — Progress Notes (Signed)
Subjective:    [name] is a [id number] [redacted]w[redacted]d being seen today for her first obstetrical visit.  Her obstetrical history is significant for sickle cell disease, advanced maternal age, history of preeclampsia, and prior csection for breech presentation.  Pt reports having approximately two sickle cell crisis per year.  First pregnancy was a term vaginal delivery with no complications.  Baby died shortly after one year of age due to bleeding complications after a female circumcision.  Baby had sickle cell disease.   Pt also has a history of having a 28 week fetal demise; noted to have "short limbs".  Pt also had a subsequent 34 wk delivery via csection due to "high blood pressure" and breech presentation.  Current father of baby does not have sickle cell trait/disease.  Followed by [facility] Zigmund Daniel [facility].  Currently pain is managed by oxycodone. Patient does intend to breast feed. Pregnancy history fully reviewed.  Patient reports no bleeding and no cramping.  Filed Vitals:   07/22/13 0818  BP: 118/72  Weight: 136 lb 4.8 oz (61.825 kg)    HISTORY: OB History  Gravida Para Term Preterm AB SAB TAB Ectopic Multiple Living  6 3 1 2 2 2    1     # Outcome Date GA Lbr Len/2nd Weight Sex Delivery Anes PTL Lv  6 CUR           5 PRE 12/14/10 [redacted]w[redacted]d  3 lb (1.361 kg) F LVCS  N Y     Comments: Preeclampsia and baby had low amniotic fluid volume. Baby was breach so she had csection.   4 SAB 2006             Comments: 7 wks  3 SAB 12/23/03             Comments: 7 wks  2 PRE 05/25/03 [redacted]w[redacted]d    SVD  N SB     Comments: Pt had fetal demise. Baby had short limbs.   1 TRM 2001 [redacted]w[redacted]d   F SVD  N N     Comments: baby had sickle cell; baby died shortly after one year old after female circumcision procedure > bleeding issues     Past Medical History  Diagnosis Date  . Sickle cell disease   . Anemia 03/30/2012    Hx of sickle cell disease  . Leg ulcer 10/27/2012    Chronic under care of  wound clinic  . Cholelithiasis y-1  . H/O miscarriage, currently pregnant   . CAP (community acquired pneumonia)     2014   Past Surgical History  Procedure Laterality Date  . Cesarean section     Family History  Problem Relation Age of Onset  . Sickle cell anemia Sister   . Diabetes Mother      Exam    Exam   BP 118/72  Wt 136 lb 4.8 oz (61.825 kg)  LMP 04/28/2013 Uterine Size: size equals dates  Pelvic Exam:    Perineum: No Hemorrhoids, Normal Perineum   Vulva: normal   Vagina:  normal mucosa, normal discharge, no palpable nodules   pH: Not done   Cervix: no bleeding following Pap, no cervical motion tenderness and no lesions   Adnexa: normal adnexa and no mass, fullness, tenderness   Bony Pelvis: Adequate  System: Breast:  No nipple retraction or dimpling, No nipple discharge or bleeding, No axillary or supraclavicular adenopathy, Normal to palpation without dominant masses   Skin: normal coloration and turgor,  no rashes; right leg scarring from previous ulceration    Neurologic: negative   Extremities: normal strength, tone, and muscle mass   HEENT neck supple with midline trachea and thyroid without masses   Mouth/Teeth mucous membranes moist, pharynx normal without lesions   Neck supple and no masses   Cardiovascular: regular rate and rhythm, no murmurs or gallops   Respiratory:  appears well, vitals normal, no respiratory distress, acyanotic, normal RR, neck free of mass or lymphadenopathy, chest clear, no wheezing, crepitations, rhonchi, normal symmetric air entry   Abdomen: soft, non-tender; bowel sounds normal; no masses,  no organomegaly   Urinary: urethral meatus normal       Assessment:    Pregnancy: 43 yo [id number] at [redacted]w[redacted]d wks IUP Hx of Previous Csection secondary to Breech Hx of Fetal Demise at 28 wks - Structural Malformation noted (short limbs) Hx of Preeclampsia with delivery at 34 wks via Csection Advanced Maternal Age  Patient Active  Problem List   Diagnosis Date Noted  . Sickle cell anemia 07/15/2013  . Sickle cell crisis 07/15/2013  . High risk pregnancy due to history of previous obstetrical problem 07/15/2013  . Pregnancy 06/09/2013  . Hypokalemia 05/07/2013  . Influenza vaccine needed 03/16/2013  . Hb-SS disease with crisis 03/16/2013  . Hb-SS disease 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 03/16/2013  . Itching 03/16/2013  . Leg ulcer   . 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 Hemochromatosis 11/10/2012  . Chronic pain syndrome 10/13/2012  . Wound of lower extremity 10/10/2012  . Sinus bradycardia by electrocardiogram 04/03/2012  . 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 Chronic wound of extremity 04/01/2012  . Elevated LFTs 03/30/2012  . Cholelithiasis 03/30/2012  . Anemia 03/30/2012  . Leukocytosis 03/30/2012  . Sickle cell disease         Plan:     Initial labs drawn. Early 1 hr.  Pap smear, OB culture. Referred to MFM for consultation and plan of care and genetic counseling.   Begin baby ASA every day. Prenatal vitamins. Problem list reviewed and updated. Genetic Screening discussed panorama recommended > refer to MFM Reviewed pt hx and plan of care with Dr. Elly Modena > agrees with plan.  Follow up in 2 weeks.  [facility] 07/22/2013

## 2013-07-22 NOTE — Telephone Encounter (Signed)
Message copied by Sheilah Pigeon on Thu Jul 22, 2013  8:41 AM ------      Message from: Liston Alba A      Created: Mon Jul 12, 2013  6:08 PM       Pt to have electrophoresis on 07/26/2013 for evaluation for exchange transfusion ------

## 2013-07-22 NOTE — Progress Notes (Signed)
Nutrition note: 1st visit consult Pt has gained 8.3# @ [redacted]w[redacted]d, which is >expected so far. Pt is eating 3 meals & 1-2 snacks/d. Pt is taking a PNV. Pt reports some nausea but no vomiting or heartburn. Pt received verbal & written education on general nutrition during pregnancy. Discussed tips to decrease nausea. Discussed wt gain goals of 25-35# or 1#/wk. Pt agrees to continue to take PNV. Pt has [place] & plans to BF. F/u if referred [name], MS, RD, LDN, [facility]

## 2013-07-22 NOTE — Progress Notes (Signed)
NT and Genetic counseling scheduled with MFM on 07/28/13  at 1130 am and 1 pm.

## 2013-07-22 NOTE — Telephone Encounter (Signed)
Patient called back.  I explained that Dr. Zigmund Daniel wants her to come in on Monday 07/26/2013 for lab work to determine if a exchange transfusion is required.  Patient in agreement but states she just had a blood transfusion on 07/21/2013 at the cancer center.  I explained to come for labs on Monday between 08:30-12:00.  Patient verbalizes understanding.

## 2013-07-22 NOTE — Telephone Encounter (Signed)
Left  Message on machine asking patient to the give office a call back.

## 2013-07-22 NOTE — Telephone Encounter (Signed)
Message copied by Sheilah Pigeon on Thu Jul 22, 2013  9:02 AM ------      Message from: Liston Alba A      Created: Mon Jul 12, 2013  6:08 PM       Pt to have electrophoresis on 07/26/2013 for evaluation for exchange transfusion ------

## 2013-07-23 LAB — PRESCRIPTION MONITORING PROFILE (SOLSTAS)
Amphetamine/Meth: NEGATIVE ng/mL
BENZODIAZEPINE SCREEN, URINE: NEGATIVE ng/mL
Barbiturate Screen, Urine: NEGATIVE ng/mL
Buprenorphine, Urine: NEGATIVE ng/mL
Cannabinoid Scrn, Ur: NEGATIVE ng/mL
Carisoprodol, Urine: NEGATIVE ng/mL
Cocaine Metabolites: NEGATIVE ng/mL
Creatinine, Urine: 42.76 mg/dL (ref >20.0)
ECSTASY: NEGATIVE ng/mL
Fentanyl, Ur: NEGATIVE ng/mL
METHADONE SCREEN, URINE: NEGATIVE ng/mL
Meperidine, Ur: NEGATIVE ng/mL
NITRITES URINE, INITIAL: NEGATIVE ug/mL
Opiate Screen, Urine: NEGATIVE ng/mL
Oxycodone Screen, Ur: NEGATIVE ng/mL
PH URINE, INITIAL: 7.5 pH (ref 4.5–8.9)
PROPOXYPHENE: NEGATIVE ng/mL
TRAMADOL UR: NEGATIVE ng/mL
Tapentadol, urine: NEGATIVE ng/mL
Zolpidem, Urine: NEGATIVE ng/mL

## 2013-07-23 LAB — OBSTETRIC PANEL
ANTIBODY SCREEN: NEGATIVE
Basophils Absolute: 0.1 10*3/uL (ref 0.0–0.1)
Basophils Relative: 1 % (ref 0–1)
Eosinophils Absolute: 0.3 10*3/uL (ref 0.0–0.7)
Eosinophils Relative: 2 % (ref 0–5)
HCT: 30.5 % — ABNORMAL LOW (ref 36.0–46.0)
HEMOGLOBIN: 10.9 g/dL — AB (ref 12.0–15.0)
Hepatitis B Surface Ag: NEGATIVE
LYMPHS PCT: 18 % (ref 12–46)
Lymphs Abs: 2.6 10*3/uL (ref 0.7–4.0)
MCH: 30 pg (ref 26.0–34.0)
MCHC: 35.7 g/dL (ref 30.0–36.0)
MCV: 84 fL (ref 78.0–100.0)
MONOS PCT: 14 % — AB (ref 3–12)
Monocytes Absolute: 2 10*3/uL — ABNORMAL HIGH (ref 0.1–1.0)
NEUTROS ABS: 9.3 10*3/uL — AB (ref 1.7–7.7)
Neutrophils Relative %: 65 % (ref 43–77)
Platelets: 372 10*3/uL (ref 150–400)
RBC: 3.63 MIL/uL — AB (ref 3.87–5.11)
RDW: 18.3 % — ABNORMAL HIGH (ref 11.5–15.5)
RH TYPE: POSITIVE
RUBELLA: 19.9 {index} — AB (ref <0.90)
WBC: 14.3 10*3/uL — AB (ref 4.0–10.5)

## 2013-07-23 LAB — GLUCOSE TOLERANCE, 1 HOUR (50G) W/O FASTING: GLUCOSE 1 HOUR GTT: 97 mg/dL (ref 70–140)

## 2013-07-24 ENCOUNTER — Encounter: Payer: Self-pay | Admitting: *Deleted

## 2013-07-24 LAB — CULTURE, OB URINE
Colony Count: NO GROWTH
Organism ID, Bacteria: NO GROWTH

## 2013-07-26 ENCOUNTER — Non-Acute Institutional Stay (HOSPITAL_COMMUNITY)
Admission: AD | Admit: 2013-07-26 | Discharge: 2013-07-26 | Disposition: A | Discharge status: Home / Self Care | Payer: Medicaid Other | Attending: Internal Medicine | Admitting: Internal Medicine

## 2013-07-26 DIAGNOSIS — D571 Sickle-cell disease 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: Secondary | ICD-10-CM | POA: Insufficient documentation

## 2013-07-26 DIAGNOSIS — O99019 Anemia complicating pregnancy, unspecified trimester: Secondary | ICD-10-CM | POA: Insufficient documentation

## 2013-07-26 NOTE — Procedures (Signed)
[name]  Procedure Note  [name] TWS:[id number] DOB: 12-29-1970 DOA: 07/26/2013   PCP: [name],[name], MD   Associated Diagnosis: Hb-ss 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, pregnancy, supervision for high risk  Procedure Note: labs drawn per previous note   Condition During Procedure:tolerated well without difficulty  Condition at [place], Samrat Hayward, [facility]

## 2013-07-28 ENCOUNTER — Ambulatory Visit (HOSPITAL_COMMUNITY)
Admission: RE | Admit: 2013-07-28 | Discharge: 2013-07-28 | Disposition: A | Discharge status: Home / Self Care | Payer: Medicaid Other | Source: Ambulatory Visit | Attending: Family | Admitting: Family

## 2013-07-28 ENCOUNTER — Telehealth: Payer: Self-pay | Admitting: Internal Medicine

## 2013-07-28 ENCOUNTER — Other Ambulatory Visit: Payer: Self-pay

## 2013-07-28 VITALS — BP 126/69 | HR 90 | Wt 144.0 lb

## 2013-07-28 DIAGNOSIS — O09299 Supervision of pregnancy with other poor reproductive or obstetric history, unspecified trimester: Secondary | ICD-10-CM | POA: Insufficient documentation

## 2013-07-28 DIAGNOSIS — O34219 Maternal care for unspecified type scar from previous cesarean delivery: Secondary | ICD-10-CM | POA: Insufficient documentation

## 2013-07-28 DIAGNOSIS — O99012 Anemia complicating pregnancy, second trimester: Secondary | ICD-10-CM

## 2013-07-28 DIAGNOSIS — O09529 Supervision of elderly multigravida, unspecified trimester: Secondary | ICD-10-CM | POA: Insufficient documentation

## 2013-07-28 DIAGNOSIS — O351XX0 Maternal care for (suspected) chromosomal abnormality in fetus, not applicable or unspecified: Secondary | ICD-10-CM | POA: Insufficient documentation

## 2013-07-28 DIAGNOSIS — O3510X0 Maternal care for (suspected) chromosomal abnormality in fetus, unspecified, not applicable or unspecified: Secondary | ICD-10-CM | POA: Insufficient documentation

## 2013-07-28 DIAGNOSIS — Z3689 Encounter for other specified antenatal screening: Secondary | ICD-10-CM | POA: Insufficient documentation

## 2013-07-28 DIAGNOSIS — IMO0002 Reserved for concepts with insufficient information to code with codable children: Secondary | ICD-10-CM | POA: Insufficient documentation

## 2013-07-28 DIAGNOSIS — D571 Sickle-cell disease 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: Secondary | ICD-10-CM

## 2013-07-28 LAB — HEMOGLOBINOPATHY EVALUATION
HEMOGLOBIN OTHER: 0 %
HGB A2 QUANT: 2.9 % (ref 2.2–3.2)
HGB A: 42.8 % — AB (ref 96.8–97.8)
HGB S QUANTITAION: 47.3 % — AB
Hgb F Quant: 7 % — ABNORMAL HIGH (ref 0.0–2.0)

## 2013-07-28 NOTE — Progress Notes (Signed)
Genetic Counseling  High-Risk Gestation Note  Appointment Date:  07/28/2013 Referred By: Joelyn Oms, CNM Date of Birth:  August 15, 1970  Pregnancy History: [id number] Estimated Date of Delivery: 02/02/14 Estimated Gestational Age: [redacted]w[redacted]d Attending: Benjaman Lobe, MD  [name] was seen for genetic counseling because of a maternal age of 43.     She was counseled regarding maternal age and the association with risk for chromosome conditions due to nondisjunction with aging of the ova.   We reviewed chromosomes, nondisjunction, and the associated 1 in 104 risk for fetal aneuploidy related to a maternal age of 43 y.o. at [redacted]w[redacted]d gestation.  She was counseled that the risk for aneuploidy decreases as gestational age increases, accounting for those pregnancies which spontaneously abort.  We specifically discussed Down syndrome (trisomy 48), trisomies 67 and 80, and sex chromosome aneuploidies (47,XXX and 47,XXY) including the common features and prognoses of each.   We reviewed available screening options including First Screen, Quad screen, noninvasive prenatal screening (NIPS)/cell free fetal DNA (cffDNA) testing, and detailed ultrasound.  She was counseled that screening tests are used to modify a patient's a priori risk for aneuploidy, typically based on age. This estimate provides a pregnancy specific risk assessment. We reviewed the benefits and limitations of each option. Specifically, we discussed the conditions for which each test screens, the detection rates, and false positive rates of each. She was also counseled regarding diagnostic testing via CVS and amniocentesis. We reviewed the associated risks for complications, including spontaneous pregnancy loss. After consideration of all the options, she elected to proceed with NIPS.  Those results will be available in 8-10 days. She also expressed interest in pursuing a nuchal translucency ultrasound, which was performed today.  The report  will be documented separately.  The patient would like to return for a detailed ultrasound at ~18+ weeks gestation.  This appointment was scheduled today. She understands that screening tests cannot rule out all birth defects or genetic syndromes. The patient was advised of this limitation and states she still does not want additional testing at this time.   Both family histories were reviewed and found to be contributory for [name], her sister, and a daughter, who passed away, having sickle cell anemia.  We discussed that sickle cell anemia (SCA) is a hemoglobinopathy in which there is an inherited structural abnormality in one of the globin chains, specifically the beta globin chain.  It is characterized by episodes of vaso-occlusive crisis and chronic anemia due to the tendency of red blood cells to become deformed under conditions of decreased oxygen tension.  The basic defect in SCA involves the change of a single amino acid altering the configuration of the hemoglobin molecule causing the cells to sickle and to obstruct blood flow in small vessels.  Ischemia of tissues and organs results.  Increased cell fragility with increased phagocytosis and splenic sequestration of the fragile cells produces anemia.  SCA (Hgb SS) is inherited as an autosomal recessive condition.  The carrier state is Hgb AS.  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  [name] reported that her husband had sickle cell screening previously, and he does not have sickle cell trait.  His medical records were not available for review.  She understands that HgS is not the only clinically significant hemoglobin variant.  We discussed the importance of her partner having  quantitative hemoglobin electrophoresis to identify any hemoglobin variant.  If he were to be carrier of a hemoglobin variant, there would be a 50% chance with  each pregnancy to have a child with sickle cell anemia (or a variant of SCA).  There would also be a 50% chance for their child to be a carrier.  If he had a normal hemoglobin electrophoresis, the risk for the pregnancy to have a hemoglobinopathy would not be increased, but each child would be a carrier for SCA (Hgb AS).  We reviewed that early diagnosis of SCA is facilitated by newborn screening before the onset of symptoms.  Clinical manifestations usually present in the first or second year of life.  [name] will discuss the option of quantitative hemoglobin electrophoresis with her partner.  The remainder of both family histories were reviewed and found to be noncontributory for birth defects, intellectual disability, and known genetic conditions. Without further information regarding the provided family history, an accurate genetic risk cannot be calculated. Further genetic counseling is warranted if more information is obtained.  In addition, Ms.  [name] reported that her husband is 72.  She was counseled that advanced paternal age (APA) is defined as paternal age greater than or equal to age 36.  Recent large-scale sequencing studies have shown that approximately 80% of de novo point mutations are of paternal origin.  Many studies have demonstrated a strong correlation between increased paternal age and de novo point mutations.  Although no specific data is available regarding fetal risks for fathers 25+ years old at conception, it is apparent that the overall risk for single gene conditions is increased.  To estimate the relative increase in risk of a genetic disorder with APA, the heritability of the disease must be considered.  Assuming an approximate 2x increase in risk for conditions that are exclusively paternal in origin, the risk for each individual condition is still relatively low.  It is estimated that the overall chance for a de novo mutation is ~0.5%.  We also discussed the wide range  of conditions which can be caused by new dominant gene mutations (achondroplasia, neurofibromatosis, Marfan syndrome etc.).  She was counseled that genetic testing for each individual single gene condition is not warranted or available unless ultrasound or family history concerns lend suspicion to a specific condition.    We also discussed that newer literature suggests that the risk for autism spectrum disorders (ASD) may be increased in children born to fathers of APA.  We discussed that ASDs are among the most common neurodevelopmental disorders, with approximately 1 in 85 children meeting criteria for ASD.  Approximately 80% of individuals diagnosed are female.  There is strong evidence that genetic factors play a critical role in development of ASD.  While there have been recent advances in identifying specific genetic causes of ASD, there are still many individuals for whom the etiology of the ASD is not known.  She understands that at this time there is no reliable, comprehensive genetic testing available for ASD.     [name] denied exposure to environmental toxins or chemical agents. She denied the use of alcohol, tobacco or street drugs. She denied significant viral illnesses during the course of her pregnancy.   I counseled this patient regarding the above risks and available options.  The approximate face-to-face time with the genetic counselor was 38 minutes.  Filbert Schilder, MS Certified Genetic Counselor

## 2013-07-28 NOTE — Telephone Encounter (Signed)
Pt referred to Dr. Willette Cluster for a yearly shared comprehensive visit 2 months ago. Pt has been seeing both Dr. 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 I have spoken with Dr. Willette Cluster and we will continue to manage her hemoglobin during her pregnancy with partial exchange and straight transfusions as indicated.

## 2013-07-29 ENCOUNTER — Encounter: Payer: Self-pay | Admitting: Family

## 2013-08-04 ENCOUNTER — Other Ambulatory Visit (HOSPITAL_BASED_OUTPATIENT_CLINIC_OR_DEPARTMENT_OTHER): Payer: Medicaid Other

## 2013-08-04 ENCOUNTER — Other Ambulatory Visit: Payer: Medicaid Other

## 2013-08-04 ENCOUNTER — Ambulatory Visit: Payer: PRIVATE HEALTH INSURANCE | Admitting: Internal Medicine

## 2013-08-04 DIAGNOSIS — D571 Sickle-cell disease 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: Secondary | ICD-10-CM

## 2013-08-04 DIAGNOSIS — Z331 Pregnant state, incidental: Secondary | ICD-10-CM

## 2013-08-04 DIAGNOSIS — Z3490 Encounter for supervision of normal pregnancy, unspecified, unspecified trimester: Secondary | ICD-10-CM

## 2013-08-04 LAB — CBC & DIFF AND RETIC
BASO%: 0.9 % (ref 0.0–2.0)
Basophils Absolute: 0.2 10*3/uL — ABNORMAL HIGH (ref 0.0–0.1)
EOS ABS: 0.4 10*3/uL (ref 0.0–0.5)
EOS%: 2.2 % (ref 0.0–7.0)
HCT: 24.6 % — ABNORMAL LOW (ref 34.8–46.6)
HGB: 8.4 g/dL — ABNORMAL LOW (ref 11.6–15.9)
Immature Retic Fract: 30.7 % — ABNORMAL HIGH (ref 1.60–10.00)
LYMPH%: 9.4 % — ABNORMAL LOW (ref 14.0–49.7)
MCH: 30.1 pg (ref 25.1–34.0)
MCHC: 34 g/dL (ref 31.5–36.0)
MCV: 88.6 fL (ref 79.5–101.0)
MONO#: 2.6 10*3/uL — ABNORMAL HIGH (ref 0.1–0.9)
MONO%: 12.6 % (ref 0.0–14.0)
NEUT%: 74.9 % (ref 38.4–76.8)
NEUTROS ABS: 15.1 10*3/uL — AB (ref 1.5–6.5)
Platelets: 427 10*3/uL — ABNORMAL HIGH (ref 145–400)
RBC: 2.78 10*6/uL — AB (ref 3.70–5.45)
RDW: 20 % — AB (ref 11.2–14.5)
RETIC %: 17.38 % — AB (ref 0.70–2.10)
Retic Ct Abs: 483.16 10*3/uL — ABNORMAL HIGH (ref 33.70–90.70)
WBC: 20.2 10*3/uL — ABNORMAL HIGH (ref 3.9–10.3)
lymph#: 1.9 10*3/uL (ref 0.9–3.3)
nRBC: 8 % — ABNORMAL HIGH (ref 0–0)

## 2013-08-04 LAB — TECHNOLOGIST REVIEW

## 2013-08-04 LAB — HOLD TUBE, BLOOD BANK

## 2013-08-05 ENCOUNTER — Encounter: Payer: Self-pay | Admitting: Family Medicine

## 2013-08-05 ENCOUNTER — Ambulatory Visit (INDEPENDENT_AMBULATORY_CARE_PROVIDER_SITE_OTHER): Payer: Medicaid Other | Admitting: Family Medicine

## 2013-08-05 VITALS — BP 120/78 | Temp 97.5°F | Wt 137.1 lb

## 2013-08-05 DIAGNOSIS — O09299 Supervision of pregnancy with other poor reproductive or obstetric history, unspecified trimester: Secondary | ICD-10-CM

## 2013-08-05 DIAGNOSIS — D571 Sickle-cell disease 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: Secondary | ICD-10-CM

## 2013-08-05 DIAGNOSIS — O34219 Maternal care for unspecified type scar from previous cesarean delivery: Secondary | ICD-10-CM

## 2013-08-05 LAB — POCT URINALYSIS DIP (DEVICE)
Bilirubin Urine: NEGATIVE
Glucose, UA: NEGATIVE mg/dL
Hgb urine dipstick: NEGATIVE
Ketones, ur: NEGATIVE mg/dL
Leukocytes, UA: NEGATIVE
Nitrite: NEGATIVE
PROTEIN: NEGATIVE mg/dL
SPECIFIC GRAVITY, URINE: 1.015 (ref 1.005–1.030)
Urobilinogen, UA: 1 mg/dL (ref 0.0–1.0)
pH: 7 (ref 5.0–8.0)

## 2013-08-05 NOTE — Patient Instructions (Signed)
Second Trimester of Pregnancy The second trimester is from week 13 through week 28, months 4 through 6. The second trimester is often a time when you feel your best. Your body has also adjusted to being pregnant, and you begin to feel better physically. Usually, morning sickness has lessened or quit completely, you may have more energy, and you may have an increase in appetite. The second trimester is also a time when the fetus is growing rapidly. At the end of the sixth month, the fetus is about 9 inches long and weighs about 1 pounds. You will likely begin to feel the baby move (quickening) between 18 and 20 weeks of the pregnancy. BODY CHANGES Your body goes through many changes during pregnancy. The changes vary from woman to woman.   Your weight will continue to increase. You will notice your lower abdomen bulging out.  You may begin to get stretch marks on your hips, abdomen, and breasts.  You may develop headaches that can be relieved by medicines approved by your caregiver.  You may urinate more often because the fetus is pressing on your bladder.  You may develop or continue to have heartburn as a result of your pregnancy.  You may develop constipation because certain hormones are causing the muscles that push waste through your intestines to slow down.  You may develop hemorrhoids or swollen, bulging veins (varicose veins).  You may have back pain because of the weight gain and pregnancy hormones relaxing your joints between the bones in your pelvis and as a result of a shift in weight and the muscles that support your balance.  Your breasts will continue to grow and be tender.  Your gums may bleed and may be sensitive to brushing and flossing.  Dark spots or blotches (chloasma, mask of pregnancy) may develop on your face. This will likely fade after the baby is born.  A dark line from your belly button to the pubic area (linea nigra) may appear. This will likely fade after  the baby is born. WHAT TO EXPECT AT YOUR PRENATAL VISITS During a routine prenatal visit:  You will be weighed to make sure you and the fetus are growing normally.  Your blood pressure will be taken.  Your abdomen will be measured to track your baby's growth.  The fetal heartbeat will be listened to.  Any test results from the previous visit will be discussed. Your caregiver may ask you:  How you are feeling.  If you are feeling the baby move.  If you have had any abnormal symptoms, such as leaking fluid, bleeding, severe headaches, or abdominal cramping.  If you have any questions. Other tests that may be performed during your second trimester include:  Blood tests that check for:  Low iron levels (anemia).  Gestational diabetes (between 24 and 28 weeks).  Rh antibodies.  Urine tests to check for infections, diabetes, or protein in the urine.  An ultrasound to confirm the proper growth and development of the baby.  An amniocentesis to check for possible genetic problems.  Fetal screens for spina bifida and Down syndrome. HOME CARE INSTRUCTIONS   Avoid all smoking, herbs, alcohol, and unprescribed drugs. These chemicals affect the formation and growth of the baby.  Follow your caregiver's instructions regarding medicine use. There are medicines that are either safe or unsafe to take during pregnancy.  Exercise only as directed by your caregiver. Experiencing uterine cramps is a good sign to stop exercising.  Continue to eat regular,  healthy meals.  Wear a good support bra for breast tenderness.  Do not use hot tubs, steam rooms, or saunas.  Wear your seat belt at all times when driving.  Avoid raw meat, uncooked cheese, cat litter boxes, and soil used by cats. These carry germs that can cause birth defects in the baby.  Take your prenatal vitamins.  Try taking a stool softener (if your caregiver approves) if you develop constipation. Eat more high-fiber  foods, such as fresh vegetables or fruit and whole grains. Drink plenty of fluids to keep your urine clear or pale yellow.  Take warm sitz baths to soothe any pain or discomfort caused by hemorrhoids. Use hemorrhoid cream if your caregiver approves.  If you develop varicose veins, wear support hose. Elevate your feet for 15 minutes, 3 4 times a day. Limit salt in your diet.  Avoid heavy lifting, wear low heel shoes, and practice good posture.  Rest with your legs elevated if you have leg cramps or low back pain.  Visit your dentist if you have not gone yet during your pregnancy. Use a soft toothbrush to brush your teeth and be gentle when you floss.  A sexual relationship may be continued unless your caregiver directs you otherwise.  Continue to go to all your prenatal visits as directed by your caregiver. SEEK MEDICAL CARE IF:   You have dizziness.  You have mild pelvic cramps, pelvic pressure, or nagging pain in the abdominal area.  You have persistent nausea, vomiting, or diarrhea.  You have a bad smelling vaginal discharge.  You have pain with urination. SEEK IMMEDIATE MEDICAL CARE IF:   You have a fever.  You are leaking fluid from your vagina.  You have spotting or bleeding from your vagina.  You have severe abdominal cramping or pain.  You have rapid weight gain or loss.  You have shortness of breath with chest pain.  You notice sudden or extreme swelling of your face, hands, ankles, feet, or legs.  You have not felt your baby move in over an hour.  You have severe headaches that do not go away with medicine.  You have vision changes. Document Released: 04/30/2001 Document Revised: 01/06/2013 Document Reviewed: 07/07/2012 [facility] Patient Information 2014 [place].  Breastfeeding Deciding to breastfeed is one of the best choices you can make for you and your baby. A change in hormones during pregnancy causes your breast tissue to grow and increases the  number and size of your milk ducts. These hormones also allow proteins, sugars, and fats from your blood supply to make breast milk in your milk-producing glands. Hormones prevent breast milk from being released before your baby is born as well as prompt milk flow after birth. Once breastfeeding has begun, thoughts of your baby, as well as his or her sucking or crying, can stimulate the release of milk from your milk-producing glands.  BENEFITS OF BREASTFEEDING For Your Baby  Your first milk (colostrum) helps your baby's digestive system function better.   There are antibodies in your milk that help your baby fight off infections.   Your baby has a lower incidence of asthma, allergies, and sudden infant death syndrome.   The nutrients in breast milk are better for your baby than infant formulas and are designed uniquely for your baby's needs.   Breast milk improves your baby's brain development.   Your baby is less likely to develop other conditions, such as childhood obesity, asthma, or type 2 diabetes mellitus.  For  You   Breastfeeding helps to create a very special bond between you and your baby.   Breastfeeding is convenient. Breast milk is always available at the correct temperature and costs nothing.   Breastfeeding helps to burn calories and helps you lose the weight gained during pregnancy.   Breastfeeding makes your uterus contract to its prepregnancy size faster and slows bleeding (lochia) after you give birth.   Breastfeeding helps to lower your risk of developing type 2 diabetes mellitus, osteoporosis, and breast or ovarian cancer later in life. SIGNS THAT YOUR BABY IS HUNGRY Early Signs of Hunger  Increased alertness or activity.  Stretching.  Movement of the head from side to side.  Movement of the head and opening of the mouth when the corner of the mouth or cheek is stroked (rooting).  Increased sucking sounds, smacking lips, cooing, sighing, or  squeaking.  Hand-to-mouth movements.  Increased sucking of fingers or hands. Late Signs of Hunger  Fussing.  Intermittent crying. Extreme Signs of Hunger Signs of extreme hunger will require calming and consoling before your baby will be able to breastfeed successfully. Do not wait for the following signs of extreme hunger to occur before you initiate breastfeeding:   Restlessness.  A loud, strong cry.   Screaming. BREASTFEEDING BASICS Breastfeeding Initiation  Find a comfortable place to sit or lie down, with your neck and back well supported.  Place a pillow or rolled up blanket under your baby to bring him or her to the level of your breast (if you are seated). Nursing pillows are specially designed to help support your arms and your baby while you breastfeed.  Make sure that your baby's abdomen is facing your abdomen.   Gently massage your breast. With your fingertips, massage from your chest wall toward your nipple in a circular motion. This encourages milk flow. You may need to continue this action during the feeding if your milk flows slowly.  Support your breast with 4 fingers underneath and your thumb above your nipple. Make sure your fingers are well away from your nipple and your baby's mouth.   Stroke your baby's lips gently with your finger or nipple.   When your baby's mouth is open wide enough, quickly bring your baby to your breast, placing your entire nipple and as much of the colored area around your nipple (areola) as possible into your baby's mouth.   More areola should be visible above your baby's upper lip than below the lower lip.   Your baby's tongue should be between his or her lower gum and your breast.   Ensure that your baby's mouth is correctly positioned around your nipple (latched). Your baby's lips should create a seal on your breast and be turned out (everted).  It is common for your baby to suck about 2 3 minutes in order to start the  flow of breast milk. Latching Teaching your baby how to latch on to your breast properly is very important. An improper latch can cause nipple pain and decreased milk supply for you and poor weight gain in your baby. Also, if your baby is not latched onto your nipple properly, he or she may swallow some air during feeding. This can make your baby fussy. Burping your baby when you switch breasts during the feeding can help to get rid of the air. However, teaching your baby to latch on properly is still the best way to prevent fussiness from swallowing air while breastfeeding. Signs that your baby has  successfully latched on to your nipple:    Silent tugging or silent sucking, without causing you pain.   Swallowing heard between every 3 4 sucks.    Muscle movement above and in front of his or her ears while sucking.  Signs that your baby has not successfully latched on to nipple:   Sucking sounds or smacking sounds from your baby while breastfeeding.  Nipple pain. If you think your baby has not latched on correctly, slip your finger into the corner of your baby's mouth to break the suction and place it between your baby's gums. Attempt breastfeeding initiation again. Signs of Successful Breastfeeding Signs from your baby:   A gradual decrease in the number of sucks or complete cessation of sucking.   Falling asleep.   Relaxation of his or her body.   Retention of a small amount of milk in his or her mouth.   Letting go of your breast by himself or herself. Signs from you:  Breasts that have increased in firmness, weight, and size 1 3 hours after feeding.   Breasts that are softer immediately after breastfeeding.  Increased milk volume, as well as a change in milk consistency and color by the 5th day of breastfeeding.   Nipples that are not sore, cracked, or bleeding. Signs That Your [employer]el Books is Getting Enough Milk  Wetting at least 3 diapers in a 24-hour period. The urine  should be clear and pale yellow by age 64 days.  At least 3 stools in a 24-hour period by age 64 days. The stool should be soft and yellow.  At least 3 stools in a 24-hour period by age 616 days. The stool should be seedy and yellow.  No loss of weight greater than 10% of birth weight during the first 91 days of age.  Average weight gain of 4 7 ounces (120 210 mL) per week after age 61 days.  Consistent daily weight gain by age 65 days, without weight loss after the age of 2 weeks. After a feeding, your baby may spit up a small amount. This is common. BREASTFEEDING FREQUENCY AND DURATION Frequent feeding will help you make more milk and can prevent sore nipples and breast engorgement. Breastfeed when you feel the need to reduce the fullness of your breasts or when your baby shows signs of hunger. This is called "breastfeeding on demand." Avoid introducing a pacifier to your baby while you are working to establish breastfeeding (the first 4 6 weeks after your baby is born). After this time you may choose to use a pacifier. Research has shown that pacifier use during the first year of a baby's life decreases the risk of sudden infant death syndrome (SIDS). Allow your baby to feed on each breast as long as he or she wants. Breastfeed until your baby is finished feeding. When your baby unlatches or falls asleep while feeding from the first breast, offer the second breast. Because newborns are often sleepy in the first few weeks of life, you may need to awaken your baby to get him or her to feed. Breastfeeding times will vary from baby to baby. However, the following rules can serve as a guide to help you ensure that your baby is properly fed:  Newborns (babies 53 weeks of age or younger) may breastfeed every 1 3 hours.  Newborns should not go longer than 3 hours during the day or 5 hours during the night without breastfeeding.  You should breastfeed your baby a minimum of  8 times in a 24-hour period until  you begin to introduce solid foods to your baby at around 55 months of age. BREAST MILK PUMPING Pumping and storing breast milk allows you to ensure that your baby is exclusively fed your breast milk, even at times when you are unable to breastfeed. This is especially important if you are going back to work while you are still breastfeeding or when you are not able to be present during feedings. Your lactation consultant can give you guidelines on how long it is safe to store breast milk.  A breast pump is a machine that allows you to pump milk from your breast into a sterile bottle. The pumped breast milk can then be stored in a refrigerator or freezer. Some breast pumps are operated by hand, while others use electricity. Ask your lactation consultant which type will work best for you. Breast pumps can be purchased, but some hospitals and breastfeeding support groups lease breast pumps on a monthly basis. A lactation consultant can teach you how to hand express breast milk, if you prefer not to use a pump.  CARING FOR YOUR BREASTS WHILE YOU BREASTFEED Nipples can become dry, cracked, and sore while breastfeeding. The following recommendations can help keep your breasts moisturized and healthy:  Avoid using soap on your nipples.   Wear a supportive bra. Although not required, special nursing bras and tank tops are designed to allow access to your breasts for breastfeeding without taking off your entire bra or top. Avoid wearing underwire style bras or extremely tight bras.  Air dry your nipples for 3 4minutes after each feeding.   Use only cotton bra pads to absorb leaked breast milk. Leaking of breast milk between feedings is normal.   Use lanolin on your nipples after breastfeeding. Lanolin helps to maintain your skin's normal moisture barrier. If you use pure lanolin you do not need to wash it off before feeding your baby again. Pure lanolin is not toxic to your baby. You may also hand express a  few drops of breast milk and gently massage that milk into your nipples and allow the milk to air dry. In the first few weeks after giving birth, some women experience extremely full breasts (engorgement). Engorgement can make your breasts feel heavy, warm, and tender to the touch. Engorgement peaks within 3 5 days after you give birth. The following recommendations can help ease engorgement:  Completely empty your breasts while breastfeeding or pumping. You may want to start by applying warm, moist heat (in the shower or with warm water-soaked hand towels) just before feeding or pumping. This increases circulation and helps the milk flow. If your baby does not completely empty your breasts while breastfeeding, pump any extra milk after he or she is finished.  Wear a snug bra (nursing or regular) or tank top for 1 2 days to signal your body to slightly decrease milk production.  Apply ice packs to your breasts, unless this is too uncomfortable for you.  Make sure that your baby is latched on and positioned properly while breastfeeding. If engorgement persists after 48 hours of following these recommendations, contact your health care provider or a Science writer. OVERALL HEALTH CARE RECOMMENDATIONS WHILE BREASTFEEDING  Eat healthy foods. Alternate between meals and snacks, eating 3 of each per day. Because what you eat affects your breast milk, some of the foods may make your baby more irritable than usual. Avoid eating these foods if you are sure that they are  negatively affecting your baby.  Drink milk, fruit juice, and water to satisfy your thirst (about 10 glasses a day).   Rest often, relax, and continue to take your prenatal vitamins to prevent fatigue, stress, and anemia.  Continue breast self-awareness checks.  Avoid chewing and smoking tobacco.  Avoid alcohol and drug use. Some medicines that may be harmful to your baby can pass through breast milk. It is important to ask your  health care provider before taking any medicine, including all over-the-counter and prescription medicine as well as vitamin and herbal supplements. It is possible to become pregnant while breastfeeding. If birth control is desired, ask your health care provider about options that will be safe for your baby. SEEK MEDICAL CARE IF:   You feel like you want to stop breastfeeding or have become frustrated with breastfeeding.  You have painful breasts or nipples.  Your nipples are cracked or bleeding.  Your breasts are red, tender, or warm.  You have a swollen area on either breast.  You have a fever or chills.  You have nausea or vomiting.  You have drainage other than breast milk from your nipples.  Your breasts do not become full before feedings by the 5th day after you give birth.  You feel sad and depressed.  Your baby is too sleepy to eat well.  Your baby is having trouble sleeping.   Your baby is wetting less than 3 diapers in a 24-hour period.  Your baby has less than 3 stools in a 24-hour period.  Your baby's skin or the white part of his or her eyes becomes yellow.   Your baby is not gaining weight by 5 days of age. SEEK IMMEDIATE MEDICAL CARE IF:   Your baby is overly tired (lethargic) and does not want to wake up and feed.  Your baby develops an unexplained fever. Document Released: 05/06/2005 Document Revised: 01/06/2013 Document Reviewed: 10/28/2012 [employer] Patient Information 2014 [employer].  

## 2013-08-05 NOTE — Progress Notes (Signed)
P=81 Pt reports continuous lower abd pain. Anatomy scan scheduled 09/01/2013.

## 2013-08-05 NOTE — Progress Notes (Signed)
Needs AFP next visit Doing well--seeing Dr. Zigmund Daniel

## 2013-08-16 ENCOUNTER — Ambulatory Visit: Payer: Medicaid Other | Admitting: Internal Medicine

## 2013-08-18 ENCOUNTER — Other Ambulatory Visit (HOSPITAL_BASED_OUTPATIENT_CLINIC_OR_DEPARTMENT_OTHER): Payer: PRIVATE HEALTH INSURANCE

## 2013-08-18 ENCOUNTER — Non-Acute Institutional Stay (HOSPITAL_COMMUNITY)
Admission: AD | Admit: 2013-08-18 | Discharge: 2013-08-18 | Disposition: A | Discharge status: Home / Self Care | Payer: PRIVATE HEALTH INSURANCE | Source: Ambulatory Visit | Attending: Hematology and Oncology | Admitting: Hematology and Oncology

## 2013-08-18 ENCOUNTER — Other Ambulatory Visit: Payer: Medicaid Other

## 2013-08-18 ENCOUNTER — Ambulatory Visit (HOSPITAL_COMMUNITY)
Admission: RE | Admit: 2013-08-18 | Discharge: 2013-08-18 | Disposition: A | Discharge status: Home / Self Care | Payer: Medicaid Other | Source: Ambulatory Visit | Attending: Hematology and Oncology | Admitting: Hematology and Oncology

## 2013-08-18 ENCOUNTER — Ambulatory Visit (HOSPITAL_BASED_OUTPATIENT_CLINIC_OR_DEPARTMENT_OTHER): Payer: Medicaid Other | Admitting: Hematology and Oncology

## 2013-08-18 ENCOUNTER — Encounter: Payer: Self-pay | Admitting: Hematology and Oncology

## 2013-08-18 VITALS — BP 119/65 | HR 81 | Temp 98.6°F | Resp 20 | Ht 67.0 in | Wt 139.4 lb

## 2013-08-18 DIAGNOSIS — O139 Gestational [pregnancy-induced] hypertension without significant proteinuria, unspecified trimester: Secondary | ICD-10-CM | POA: Insufficient documentation

## 2013-08-18 DIAGNOSIS — D571 Sickle-cell disease 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: Secondary | ICD-10-CM

## 2013-08-18 DIAGNOSIS — R51 Headache: Secondary | ICD-10-CM | POA: Insufficient documentation

## 2013-08-18 DIAGNOSIS — D72829 Elevated white blood cell count, unspecified: Secondary | ICD-10-CM | POA: Insufficient documentation

## 2013-08-18 DIAGNOSIS — D649 Anemia, unspecified: Secondary | ICD-10-CM

## 2013-08-18 DIAGNOSIS — Z79899 Other long term (current) drug therapy: Secondary | ICD-10-CM | POA: Insufficient documentation

## 2013-08-18 DIAGNOSIS — O99891 Other specified diseases and conditions complicating pregnancy: Secondary | ICD-10-CM | POA: Insufficient documentation

## 2013-08-18 DIAGNOSIS — D473 Essential (hemorrhagic) thrombocythemia: Secondary | ICD-10-CM

## 2013-08-18 DIAGNOSIS — R945 Abnormal results of liver function studies: Principal | ICD-10-CM

## 2013-08-18 DIAGNOSIS — O9989 Other specified diseases and conditions complicating pregnancy, childbirth and the puerperium: Secondary | ICD-10-CM

## 2013-08-18 DIAGNOSIS — R7989 Other specified abnormal findings of blood chemistry: Secondary | ICD-10-CM

## 2013-08-18 DIAGNOSIS — O99019 Anemia complicating pregnancy, unspecified trimester: Secondary | ICD-10-CM

## 2013-08-18 DIAGNOSIS — Z3490 Encounter for supervision of normal pregnancy, unspecified, unspecified trimester: Secondary | ICD-10-CM

## 2013-08-18 DIAGNOSIS — D72819 Decreased white blood cell count, unspecified: Secondary | ICD-10-CM

## 2013-08-18 LAB — CBC & DIFF AND RETIC
BASO%: 1.3 % (ref 0.0–2.0)
BASOS ABS: 0.2 10*3/uL — AB (ref 0.0–0.1)
EOS%: 3.7 % (ref 0.0–7.0)
Eosinophils Absolute: 0.7 10*3/uL — ABNORMAL HIGH (ref 0.0–0.5)
HEMATOCRIT: 22.6 % — AB (ref 34.8–46.6)
HEMOGLOBIN: 7.9 g/dL — AB (ref 11.6–15.9)
LYMPH%: 13.1 % — ABNORMAL LOW (ref 14.0–49.7)
MCH: 31 pg (ref 25.1–34.0)
MCHC: 34.8 g/dL (ref 31.5–36.0)
MCV: 88.9 fL (ref 79.5–101.0)
MONO#: 2.4 10*3/uL — ABNORMAL HIGH (ref 0.1–0.9)
MONO%: 13.1 % (ref 0.0–14.0)
NEUT#: 12.6 10*3/uL — ABNORMAL HIGH (ref 1.5–6.5)
NEUT%: 68.8 % (ref 38.4–76.8)
Platelets: 340 10*3/uL (ref 145–400)
RBC: 2.54 10*6/uL — ABNORMAL LOW (ref 3.70–5.45)
RDW: 22.2 % — ABNORMAL HIGH (ref 11.2–14.5)
RETIC CT ABS: UNDETERMINED 10*3/uL (ref 33.70–90.70)
WBC: 18.4 10*3/uL — ABNORMAL HIGH (ref 3.9–10.3)
lymph#: 2.4 10*3/uL (ref 0.9–3.3)
nRBC: 12 % — ABNORMAL HIGH (ref 0–0)

## 2013-08-18 LAB — RETICULOCYTES (CHCC)
ABS RETIC: 471.6 10*3/uL — AB (ref 19.0–186.0)
RBC.: 2.62 MIL/uL — AB (ref 3.87–5.11)
Retic Ct Pct: 18 % — ABNORMAL HIGH (ref 0.4–2.3)

## 2013-08-18 LAB — PREPARE RBC (CROSSMATCH)

## 2013-08-18 LAB — TECHNOLOGIST REVIEW

## 2013-08-18 LAB — HOLD TUBE, BLOOD BANK

## 2013-08-18 MED ORDER — HEPARIN SOD (PORK) LOCK FLUSH 100 UNIT/ML IV SOLN: 500.0000 [IU] | Freq: Every day | INTRAVENOUS | Status: DC | PRN

## 2013-08-18 MED ORDER — SODIUM CHLORIDE 0.9 % IJ SOLN: 3.0000 mL | INTRAMUSCULAR | Status: DC | PRN

## 2013-08-18 MED ORDER — SODIUM CHLORIDE 0.9 % IJ SOLN: 10.0000 mL | INTRAMUSCULAR | Status: DC | PRN

## 2013-08-18 MED ORDER — ACETAMINOPHEN 325 MG PO TABS
650.0000 mg | ORAL_TABLET | Freq: Once | ORAL | Status: AC
  Administered 2013-08-18: 650 mg via ORAL
  Filled 2013-08-18: qty 2

## 2013-08-18 MED ORDER — HEPARIN SOD (PORK) LOCK FLUSH 100 UNIT/ML IV SOLN: 250.0000 [IU] | INTRAVENOUS | Status: DC | PRN

## 2013-08-18 MED ORDER — SODIUM CHLORIDE 0.9 % IV SOLN
250.0000 mL | Freq: Once | INTRAVENOUS | Status: AC
  Administered 2013-08-18: 250 mL via INTRAVENOUS

## 2013-08-18 NOTE — Progress Notes (Addendum)
Patient c/o headache, rating pain 8/10. Also BP 94/47.  MD notified and acknowledged.Orders received and initiated. To continue to monitor.

## 2013-08-18 NOTE — Progress Notes (Signed)
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 According to the patient, she is pregnant again, with a due date expected on 02/02/2014. Previously, when she was pregnant with her daughter, she had severe hypertension, and intrauterine growth retardation of her daughter and her baby was delivered with C-section at 47 weeks. Her daughter is currently healthy. With her sickle cell disease, she had recurrent admission to the hospital 2-3 times a year for painful chest crisis. She was also known to have avascular necrosis of hips but never required any joint surgery. She never developed history of stroke. She had numerous transfusions in the past without complications. The patient does not take hydroxyurea when she was not pregnant. She was found to have abnormal CBC from her routine blood work recently, the last time she had exchange transfusion a few months ago for anemia of 7.9 g INTERVAL HISTORY: [name] 43 y.o. female returns for return follow-up. The patient denies any recent signs or symptoms of bleeding such as spontaneous epistaxis, hematuria or hematochezia. She complained of daily headache recently. No weakness or neurological deficits.  I have reviewed the past medical history, past surgical history, social history and family history with the patient and they are unchanged from previous note.  ALLERGIES:  has No Known Allergies.  MEDICATIONS:  Current Outpatient Prescriptions  Medication Sig Dispense Refill  . folic acid (FOLVITE) 1 MG tablet Take 5 tablets (5 mg total) by mouth daily. Please dispense higher strengths if available  300 tablet  3  . oxyCODONE (OXY  IR/ROXICODONE) 5 MG immediate release tablet Take 1 tablet (5 mg total) by mouth every 4 (four) hours as needed.  90 tablet  0  . Prenatal Vit-Fe Fumarate-FA (PRENATAL PO) Take 1 tablet by mouth daily.       No current facility-administered medications for this visit.     REVIEW OF SYSTEMS:   Constitutional: Denies fevers, chills or night sweats Eyes: Denies blurriness of vision Ears, nose, mouth, throat, and face: Denies mucositis or sore throat Respiratory: Denies cough, dyspnea or wheezes Cardiovascular: Denies palpitation, chest discomfort or lower extremity swelling Gastrointestinal:  Denies nausea, heartburn or change in bowel habits Skin: Denies abnormal skin rashes Lymphatics: Denies new lymphadenopathy or easy bruising Neurological:Denies numbness, tingling or new weaknesses Behavioral/Psych: Mood is stable, no new changes  All other systems were reviewed with the patient and are negative.  PHYSICAL EXAMINATION: ECOG PERFORMANCE STATUS: 1 - Symptomatic but completely ambulatory  Filed Vitals:   08/18/13 0853  BP: 119/65  Pulse: 81  Temp: 98.6 F (37 C)  Resp: 20   Filed Weights   08/18/13 0853  Weight: 139 lb 6.4 oz (63.231 kg)    GENERAL:alert, no distress and comfortable SKIN: skin color, texture, turgor are normal, no rashes or significant lesions EYES: normal, Conjunctiva are pale with slight jaundice and non-injected, sclera clear OROPHARYNX:no exudate, no erythema and lips, buccal mucosa, and tongue normal  NECK: supple, thyroid normal size, non-tender, without nodularity LYMPH:  no palpable lymphadenopathy in the cervical, axillary or inguinal LUNGS: clear to auscultation and percussion with normal breathing effort HEART: regular rate & rhythm and no murmurs and no lower extremity edema ABDOMEN:abdomen soft, non-tender, limited  exam due to pregnant uterus and normal bowel sounds Musculoskeletal:no cyanosis of digits and no clubbing  NEURO: alert & oriented  x 3 with fluent speech, no focal motor/sensory deficits  LABORATORY DATA:  I have reviewed the data as listed Results for orders placed in visit on 08/18/13 (from the past 48 hour(s))  HOLD TUBE, BLOOD BANK     Status: None   Collection Time    08/18/13  8:30 AM      Result Value Ref Range   Hold Tube, Blood Bank Type and Crossmatch Added    CBC & DIFF AND RETIC     Status: Abnormal   Collection Time    08/18/13  8:30 AM      Result Value Ref Range   WBC 18.4 (*) 3.9 - 10.3 10e3/uL   NEUT# 12.6 (*) 1.5 - 6.5 10e3/uL   HGB 7.9 (*) 11.6 - 15.9 g/dL   HCT 22.6 (*) 34.8 - 46.6 %   Platelets 340  145 - 400 10e3/uL   MCV 88.9  79.5 - 101.0 fL   MCH 31.0  25.1 - 34.0 pg   MCHC 34.8  31.5 - 36.0 g/dL   RBC 2.54 (*) 3.70 - 5.45 10e6/uL   RDW 22.2 (*) 11.2 - 14.5 %   lymph# 2.4  0.9 - 3.3 10e3/uL   MONO# 2.4 (*) 0.1 - 0.9 10e3/uL   Eosinophils Absolute 0.7 (*) 0.0 - 0.5 10e3/uL   Basophils Absolute 0.2 (*) 0.0 - 0.1 10e3/uL   NEUT% 68.8  38.4 - 76.8 %   LYMPH% 13.1 (*) 14.0 - 49.7 %   MONO% 13.1  0.0 - 14.0 %   EOS% 3.7  0.0 - 7.0 %   BASO% 1.3  0.0 - 2.0 %   nRBC 12 (*) 0 - 0 %   Retic % Sent out for confirmation.  0.70 - 2.10 %   Comment: Result amended from (<0.38 [0.00]) (08/18/2013 8:49 AM) to (Not Reported).Result amended from () (08/18/2013 8:49 AM) to (Sent out for confirmation.).Result prefix amended from (>) (08/18/2013 8:49 AM) to ().Result amended from (>22.70 [22.70]) (08/18/2013 8:49      AM) to (>0.00 [0.00]).   Retic Ct Abs unable to determine  33.70 - 90.70 10e3/uL   Comment: Result amended from (0.00) (08/18/2013 8:58 AM) to (Not Reported).Calculated result amended automatically from (0.00) (08/18/2013 8:58 AM) to (0.00)Calculated result amended automatically from (-999.99) (08/18/2013 8:54 AM) to (0.00)  TECHNOLOGIST REVIEW     Status: None   Collection Time    08/18/13  8:30 AM      Result Value Ref Range   Technologist Review       Value: Rare Metas and Myelocytes present,  moderate target cells, few sickle cells   RETICULOCYTE COUNT (SLN)     Status: Abnormal   Collection Time    08/18/13  9:14 AM      Result Value Ref Range   Retic Ct Pct 18.0 (*) 0.4 - 2.3 %   RBC. 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 Closer to her third trimester, I will see her on a weekly basis. I establish transfusion threshold to give her blood transfusion if her hemoglobin dropped to less than 8 g. I would not recommend exchange transfusion in this situation. The risks, benefits and side effects of blood transfusion were discussed with the patient. I recommend increasing her folic acid to 5 mg daily I will proceed with blood transfusion today #3 leukocytosis This could be related to pregnancy. I will observe. #4 mild thrombocytosis This could be related to pregnancy. I will observe. #5 history of pregnancy-induced hypertension I will want monitor blood pressure carefully here. Her blood pressure today is normal #6 Headache This could be due to symptoms of anemia. I will transfuse her today and give her tylenol prn All questions were answered. The patient knows to call the clinic with any problems, questions or concerns. No barriers to learning was detected.  I spent 40 minutes counseling the patient face to face. The total time spent in the appointment was 55 minutes and more than 50% was on counseling.     [facility], Jyoti Harju, MD 08/18/2013 9:23 PM

## 2013-08-18 NOTE — Discharge Instructions (Signed)

## 2013-08-18 NOTE — Procedures (Addendum)
[facility]  Procedure Note  [name] YHC:[id number] DOB: 11-12-1970 DOA: 08/18/2013   Ordering Physician: Natale Lay   Associated Diagnosis: Anemia   Procedure Note: Patient received 2 Units PRBC with no complications.    Condition During Procedure: Patient tolerated well. No complaints.  VSS   Condition at Discharge: Patient tolerated well. Patient in no apparent distress. VSS. Patient instructed to seek medical attention if any changes in condition arise, Patient acknowledges. Patient states she is to call [facility] tomorrow to schedule follow-up appointment. Patient left day hospital with belongings ambulatory via self.    Wendie Simmer, RN  [facility]

## 2013-08-18 NOTE — H&P (Signed)
The patient is here for blood transfusion only. 

## 2013-08-19 ENCOUNTER — Telehealth: Payer: Self-pay | Admitting: *Deleted

## 2013-08-19 LAB — TYPE AND SCREEN
ABO/RH(D): B POS
Antibody Screen: NEGATIVE
UNIT DIVISION: 0
Unit division: 0

## 2013-08-19 NOTE — Telephone Encounter (Signed)
Per 4/1 POF I have scheduled appts. Per new staff message moved patient to [phone]

## 2013-08-20 ENCOUNTER — Telehealth (HOSPITAL_COMMUNITY): Payer: Self-pay

## 2013-08-20 NOTE — Telephone Encounter (Signed)
Called [name] to discuss her cell free fetal DNA test results.  Mrs. [name] had Panorama testing through [facility].  Testing was offered because of a maternal age of 64.   The patient was identified by name and DOB.  We reviewed that these are within normal limits, showing a less than 1 in 10,000 risk for trisomies 21, 18 and 13, and monosomy X (Turner syndrome).  In addition, the risk for triploidy/vanishing twin and sex chromosome trisomies (47,XXX and 47,XXY) was also low risk.  We reviewed that this testing identifies > 99% of pregnancies with trisomy 42, trisomy 65, sex chromosome trisomies (47,XXX and 47,XXY), and triploidy. The detection rate for trisomy 18 is 96%.  The detection rate for monosomy X is ~92%.  The false positive rate is <0.1% for all conditions. Testing was also consistent with female gender.  The patient did wish to know gender.  She understands that this testing does not identify all genetic conditions.  All questions were answered to her satisfaction, she was encouraged to call with additional questions or concerns.  Filbert Schilder, MS Certified Genetic Counselor

## 2013-08-26 ENCOUNTER — Ambulatory Visit (INDEPENDENT_AMBULATORY_CARE_PROVIDER_SITE_OTHER): Payer: Medicaid Other | Admitting: Family Medicine

## 2013-08-26 VITALS — BP 130/73 | Temp 97.5°F | Wt 138.5 lb

## 2013-08-26 DIAGNOSIS — O09529 Supervision of elderly multigravida, unspecified trimester: Secondary | ICD-10-CM

## 2013-08-26 DIAGNOSIS — K8020 Calculus of gallbladder without cholecystitis without obstruction: Secondary | ICD-10-CM

## 2013-08-26 DIAGNOSIS — O34219 Maternal care for unspecified type scar from previous cesarean delivery: Secondary | ICD-10-CM

## 2013-08-26 DIAGNOSIS — O09299 Supervision of pregnancy with other poor reproductive or obstetric history, unspecified trimester: Secondary | ICD-10-CM

## 2013-08-26 DIAGNOSIS — D649 Anemia, unspecified: Secondary | ICD-10-CM

## 2013-08-26 LAB — POCT URINALYSIS DIP (DEVICE)
Bilirubin Urine: NEGATIVE
Glucose, UA: NEGATIVE mg/dL
Hgb urine dipstick: NEGATIVE
Ketones, ur: NEGATIVE mg/dL
Leukocytes, UA: NEGATIVE
NITRITE: NEGATIVE
PROTEIN: NEGATIVE mg/dL
SPECIFIC GRAVITY, URINE: 1.01 (ref 1.005–1.030)
Urobilinogen, UA: 1 mg/dL (ref 0.0–1.0)
pH: 7 (ref 5.0–8.0)

## 2013-08-26 NOTE — Patient Instructions (Signed)
Second Trimester of Pregnancy The second trimester is from week 13 through week 28, months 4 through 6. The second trimester is often a time when you feel your best. Your body has also adjusted to being pregnant, and you begin to feel better physically. Usually, morning sickness has lessened or quit completely, you may have more energy, and you may have an increase in appetite. The second trimester is also a time when the fetus is growing rapidly. At the end of the sixth month, the fetus is about 9 inches long and weighs about 1 pounds. You will likely begin to feel the baby move (quickening) between 18 and 20 weeks of the pregnancy. BODY CHANGES Your body goes through many changes during pregnancy. The changes vary from woman to woman.   Your weight will continue to increase. You will notice your lower abdomen bulging out.  You may begin to get stretch marks on your hips, abdomen, and breasts.  You may develop headaches that can be relieved by medicines approved by your caregiver.  You may urinate more often because the fetus is pressing on your bladder.  You may develop or continue to have heartburn as a result of your pregnancy.  You may develop constipation because certain hormones are causing the muscles that push waste through your intestines to slow down.  You may develop hemorrhoids or swollen, bulging veins (varicose veins).  You may have back pain because of the weight gain and pregnancy hormones relaxing your joints between the bones in your pelvis and as a result of a shift in weight and the muscles that support your balance.  Your breasts will continue to grow and be tender.  Your gums may bleed and may be sensitive to brushing and flossing.  Dark spots or blotches (chloasma, mask of pregnancy) may develop on your face. This will likely fade after the baby is born.  A dark line from your belly button to the pubic area (linea nigra) may appear. This will likely fade after the  baby is born. WHAT TO EXPECT AT YOUR PRENATAL VISITS During a routine prenatal visit:  You will be weighed to make sure you and the fetus are growing normally.  Your blood pressure will be taken.  Your abdomen will be measured to track your baby's growth.  The fetal heartbeat will be listened to.  Any test results from the previous visit will be discussed. Your caregiver may ask you:  How you are feeling.  If you are feeling the baby move.  If you have had any abnormal symptoms, such as leaking fluid, bleeding, severe headaches, or abdominal cramping.  If you have any questions. Other tests that may be performed during your second trimester include:  Blood tests that check for:  Low iron levels (anemia).  Gestational diabetes (between 24 and 28 weeks).  Rh antibodies.  Urine tests to check for infections, diabetes, or protein in the urine.  An ultrasound to confirm the proper growth and development of the baby.  An amniocentesis to check for possible genetic problems.  Fetal screens for spina bifida and Down syndrome. HOME CARE INSTRUCTIONS   Avoid all smoking, herbs, alcohol, and unprescribed drugs. These chemicals affect the formation and growth of the baby.  Follow your caregiver's instructions regarding medicine use. There are medicines that are either safe or unsafe to take during pregnancy.  Exercise only as directed by your caregiver. Experiencing uterine cramps is a good sign to stop exercising.  Continue to eat regular,   healthy meals.  Wear a good support bra for breast tenderness.  Do not use hot tubs, steam rooms, or saunas.  Wear your seat belt at all times when driving.  Avoid raw meat, uncooked cheese, cat litter boxes, and soil used by cats. These carry germs that can cause birth defects in the baby.  Take your prenatal vitamins.  Try taking a stool softener (if your caregiver approves) if you develop constipation. Eat more high-fiber foods,  such as fresh vegetables or fruit and whole grains. Drink plenty of fluids to keep your urine clear or pale yellow.  Take warm sitz baths to soothe any pain or discomfort caused by hemorrhoids. Use hemorrhoid cream if your caregiver approves.  If you develop varicose veins, wear support hose. Elevate your feet for 15 minutes, 3 4 times a day. Limit salt in your diet.  Avoid heavy lifting, wear low heel shoes, and practice good posture.  Rest with your legs elevated if you have leg cramps or low back pain.  Visit your dentist if you have not gone yet during your pregnancy. Use a soft toothbrush to brush your teeth and be gentle when you floss.  A sexual relationship may be continued unless your caregiver directs you otherwise.  Continue to go to all your prenatal visits as directed by your caregiver. SEEK MEDICAL CARE IF:   You have dizziness.  You have mild pelvic cramps, pelvic pressure, or nagging pain in the abdominal area.  You have persistent nausea, vomiting, or diarrhea.  You have a bad smelling vaginal discharge.  You have pain with urination. SEEK IMMEDIATE MEDICAL CARE IF:   You have a fever.  You are leaking fluid from your vagina.  You have spotting or bleeding from your vagina.  You have severe abdominal cramping or pain.  You have rapid weight gain or loss.  You have shortness of breath with chest pain.  You notice sudden or extreme swelling of your face, hands, ankles, feet, or legs.  You have not felt your baby move in over an hour.  You have severe headaches that do not go away with medicine.  You have vision changes. Document Released: 04/30/2001 Document Revised: 01/06/2013 Document Reviewed: 07/07/2012 [employer] Patient Information 2014 [employer].  

## 2013-08-26 NOTE — Progress Notes (Signed)
S: 43 yo [id number] @ [redacted]w[redacted]d here for ROBV - doing well.  - no complaints.   Has anatomy scan next week - no vb, lof. +FM  O: see flowsheet  A/P - f/u in 4 weeks - anatomy scan as scheduled - AFP today   - bleeding precautions discussed.

## 2013-08-26 NOTE — Progress Notes (Signed)
P= 80 C/o of lower abdominal/pelvic pressure and lower back pain.

## 2013-08-30 LAB — ALPHA FETOPROTEIN, MATERNAL
AFP: 78.8 IU/mL
Curr Gest Age: 17.1 wks.days
MOM FOR AFP: 1.93
OPEN SPINA BIFIDA: NEGATIVE
Osb Risk: 1:1800 {titer}

## 2013-09-01 ENCOUNTER — Telehealth: Payer: Self-pay | Admitting: *Deleted

## 2013-09-01 ENCOUNTER — Other Ambulatory Visit (HOSPITAL_BASED_OUTPATIENT_CLINIC_OR_DEPARTMENT_OTHER): Payer: Medicaid Other

## 2013-09-01 ENCOUNTER — Ambulatory Visit (HOSPITAL_COMMUNITY)
Admission: RE | Admit: 2013-09-01 | Discharge: 2013-09-01 | Disposition: A | Discharge status: Home / Self Care | Payer: PRIVATE HEALTH INSURANCE | Source: Ambulatory Visit | Attending: Internal Medicine | Admitting: Internal Medicine

## 2013-09-01 ENCOUNTER — Encounter: Payer: Self-pay | Admitting: Family

## 2013-09-01 ENCOUNTER — Other Ambulatory Visit (HOSPITAL_COMMUNITY): Payer: Self-pay | Admitting: Obstetrics and Gynecology

## 2013-09-01 DIAGNOSIS — D571 Sickle-cell disease 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: Secondary | ICD-10-CM

## 2013-09-01 DIAGNOSIS — O358XX0 Maternal care for other (suspected) fetal abnormality and damage, not applicable or unspecified: Secondary | ICD-10-CM | POA: Insufficient documentation

## 2013-09-01 DIAGNOSIS — Z1389 Encounter for screening for other disorder: Secondary | ICD-10-CM | POA: Insufficient documentation

## 2013-09-01 DIAGNOSIS — O09299 Supervision of pregnancy with other poor reproductive or obstetric history, unspecified trimester: Secondary | ICD-10-CM

## 2013-09-01 DIAGNOSIS — O34219 Maternal care for unspecified type scar from previous cesarean delivery: Secondary | ICD-10-CM

## 2013-09-01 DIAGNOSIS — O09529 Supervision of elderly multigravida, unspecified trimester: Secondary | ICD-10-CM

## 2013-09-01 DIAGNOSIS — Z8751 Personal history of pre-term labor: Secondary | ICD-10-CM | POA: Insufficient documentation

## 2013-09-01 DIAGNOSIS — Z3490 Encounter for supervision of normal pregnancy, unspecified, unspecified trimester: Secondary | ICD-10-CM

## 2013-09-01 DIAGNOSIS — Z363 Encounter for antenatal screening for malformations: Secondary | ICD-10-CM | POA: Insufficient documentation

## 2013-09-01 LAB — CBC & DIFF AND RETIC
BASO%: 1 % (ref 0.0–2.0)
Basophils Absolute: 0.2 10*3/uL — ABNORMAL HIGH (ref 0.0–0.1)
EOS ABS: 0.5 10*3/uL (ref 0.0–0.5)
EOS%: 2.5 % (ref 0.0–7.0)
HEMATOCRIT: 25.2 % — AB (ref 34.8–46.6)
HEMOGLOBIN: 8.6 g/dL — AB (ref 11.6–15.9)
LYMPH%: 10.3 % — ABNORMAL LOW (ref 14.0–49.7)
MCH: 28.3 pg (ref 25.1–34.0)
MCHC: 34.2 g/dL (ref 31.5–36.0)
MCV: 82.7 fL (ref 79.5–101.0)
MONO#: 2.2 10*3/uL — ABNORMAL HIGH (ref 0.1–0.9)
MONO%: 12 % (ref 0.0–14.0)
NEUT#: 13.8 10*3/uL — ABNORMAL HIGH (ref 1.5–6.5)
NEUT%: 74.2 % (ref 38.4–76.8)
Platelets: 442 10*3/uL — ABNORMAL HIGH (ref 145–400)
RBC: 3.04 10*6/uL — ABNORMAL LOW (ref 3.70–5.45)
RDW: 27.3 % — AB (ref 11.2–14.5)
WBC: 18.6 10*3/uL — ABNORMAL HIGH (ref 3.9–10.3)
lymph#: 1.9 10*3/uL (ref 0.9–3.3)
nRBC: 9 % — ABNORMAL HIGH (ref 0–0)

## 2013-09-01 LAB — RETICULOCYTES (CHCC)
ABS Retic: 442.7 K/uL — ABNORMAL HIGH (ref 19.0–186.0)
RBC.: 3.14 MIL/uL — ABNORMAL LOW (ref 3.87–5.11)
Retic Ct Pct: 14.1 % — ABNORMAL HIGH (ref 0.4–2.3)

## 2013-09-01 LAB — HOLD TUBE, BLOOD BANK

## 2013-09-01 LAB — TECHNOLOGIST REVIEW

## 2013-09-01 NOTE — Telephone Encounter (Signed)
Pt called for results of her Hgb today, asking if she needs blood. Informed pt of result 8.6. No need for transfusion and to keep appt in 2 wks as scheduled.  Call sooner if any problems.  She verbalized understanding.

## 2013-09-15 ENCOUNTER — Telehealth: Payer: Self-pay | Admitting: Hematology and Oncology

## 2013-09-15 ENCOUNTER — Other Ambulatory Visit (HOSPITAL_BASED_OUTPATIENT_CLINIC_OR_DEPARTMENT_OTHER): Payer: PRIVATE HEALTH INSURANCE

## 2013-09-15 ENCOUNTER — Ambulatory Visit (HOSPITAL_BASED_OUTPATIENT_CLINIC_OR_DEPARTMENT_OTHER): Payer: PRIVATE HEALTH INSURANCE | Admitting: Hematology and Oncology

## 2013-09-15 VITALS — BP 129/73 | HR 86 | Temp 98.7°F | Resp 18 | Ht 67.0 in | Wt 141.3 lb

## 2013-09-15 DIAGNOSIS — R51 Headache: Secondary | ICD-10-CM

## 2013-09-15 DIAGNOSIS — Z3490 Encounter for supervision of normal pregnancy, unspecified, unspecified trimester: Secondary | ICD-10-CM

## 2013-09-15 DIAGNOSIS — D72829 Elevated white blood cell count, unspecified: Secondary | ICD-10-CM

## 2013-09-15 DIAGNOSIS — O9989 Other specified diseases and conditions complicating pregnancy, childbirth and the puerperium: Secondary | ICD-10-CM

## 2013-09-15 DIAGNOSIS — O99891 Other specified diseases and conditions complicating pregnancy: Secondary | ICD-10-CM

## 2013-09-15 DIAGNOSIS — R7989 Other specified abnormal findings of blood chemistry: Secondary | ICD-10-CM

## 2013-09-15 DIAGNOSIS — D571 Sickle-cell disease 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: Secondary | ICD-10-CM

## 2013-09-15 DIAGNOSIS — D649 Anemia, unspecified: Secondary | ICD-10-CM

## 2013-09-15 DIAGNOSIS — I158 Other secondary hypertension: Secondary | ICD-10-CM

## 2013-09-15 DIAGNOSIS — R945 Abnormal results of liver function studies: Secondary | ICD-10-CM

## 2013-09-15 LAB — RETICULOCYTES (CHCC)
ABS Retic: 642.7 10*3/uL — ABNORMAL HIGH (ref 19.0–186.0)
RBC.: 3.12 MIL/uL — AB (ref 3.87–5.11)
RETIC CT PCT: 20.6 % — AB (ref 0.4–2.3)

## 2013-09-15 LAB — COMPREHENSIVE METABOLIC PANEL (CC13)
ALBUMIN: 3 g/dL — AB (ref 3.5–5.0)
ALK PHOS: 159 U/L — AB (ref 40–150)
ALT: 18 U/L (ref 0–55)
AST: 48 U/L — ABNORMAL HIGH (ref 5–34)
Anion Gap: 12 mEq/L — ABNORMAL HIGH (ref 3–11)
BUN: 4.6 mg/dL — AB (ref 7.0–26.0)
CO2: 20 mEq/L — ABNORMAL LOW (ref 22–29)
Calcium: 8.8 mg/dL (ref 8.4–10.4)
Chloride: 105 mEq/L (ref 98–109)
Creatinine: 0.5 mg/dL — ABNORMAL LOW (ref 0.6–1.1)
Glucose: 97 mg/dl (ref 70–140)
Potassium: 3.4 mEq/L — ABNORMAL LOW (ref 3.5–5.1)
Sodium: 137 mEq/L (ref 136–145)
Total Bilirubin: 2.49 mg/dL — ABNORMAL HIGH (ref 0.20–1.20)
Total Protein: 7.8 g/dL (ref 6.4–8.3)

## 2013-09-15 LAB — CBC & DIFF AND RETIC
BASO%: 0.7 % (ref 0.0–2.0)
Basophils Absolute: 0.1 10*3/uL (ref 0.0–0.1)
EOS%: 1.8 % (ref 0.0–7.0)
Eosinophils Absolute: 0.3 10*3/uL (ref 0.0–0.5)
HCT: 25.7 % — ABNORMAL LOW (ref 34.8–46.6)
HGB: 8.9 g/dL — ABNORMAL LOW (ref 11.6–15.9)
LYMPH%: 8.1 % — ABNORMAL LOW (ref 14.0–49.7)
MCH: 29.3 pg (ref 25.1–34.0)
MCHC: 34.5 g/dL (ref 31.5–36.0)
MCV: 85 fL (ref 79.5–101.0)
MONO#: 2.3 10*3/uL — ABNORMAL HIGH (ref 0.1–0.9)
MONO%: 12 % (ref 0.0–14.0)
NEUT#: 15.1 10*3/uL — ABNORMAL HIGH (ref 1.5–6.5)
NEUT%: 77.4 % — ABNORMAL HIGH (ref 38.4–76.8)
Platelets: 378 10*3/uL (ref 145–400)
RBC: 3.03 10*6/uL — ABNORMAL LOW (ref 3.70–5.45)
RDW: 27.8 % — AB (ref 11.2–14.5)
WBC: 19.5 10*3/uL — ABNORMAL HIGH (ref 3.9–10.3)
lymph#: 1.6 10*3/uL (ref 0.9–3.3)
nRBC: 19 % — ABNORMAL HIGH (ref 0–0)

## 2013-09-15 LAB — HOLD TUBE, BLOOD BANK

## 2013-09-15 LAB — TECHNOLOGIST REVIEW

## 2013-09-15 NOTE — Telephone Encounter (Signed)
Gave pt appt for lab and MD for MAy and June 2015

## 2013-09-16 ENCOUNTER — Ambulatory Visit (HOSPITAL_COMMUNITY): Payer: PRIVATE HEALTH INSURANCE

## 2013-09-16 NOTE — Progress Notes (Signed)
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 According to the patient, she is pregnant again, with a due date expected on 02/02/2014. Previously, when she was pregnant with her daughter, she had severe hypertension, and intrauterine growth retardation of her daughter and her baby was delivered with C-section at 54 weeks. Her daughter is currently healthy. With her sickle cell disease, she had recurrent admission to the hospital 2-3 times a year for painful chest crisis. She was also known to have avascular necrosis of hips but never required any joint surgery. She never developed history of stroke. She had numerous transfusions in the past without complications. The patient does not take hydroxyurea when she was not pregnant. She was found to have abnormal CBC from her routine blood work recently, the last time she had exchange transfusion a few months ago for anemia of 7.9 g INTERVAL HISTORY: Aissa Lisowski 43 y.o. female returns for further followup. She has occasional nosebleeds from nasal congestion. Overall she feels healthy. She denies any signs and symptoms of anemia such as chest pain, shortness of breath or dizziness. She has chronic headaches but it is not worse recently. Her pregnancy is progressing well.  I have reviewed the past medical history, past surgical history, social history and family history with the patient and they are unchanged from previous note.  ALLERGIES:  has No Known Allergies.  MEDICATIONS:  Current Outpatient Prescriptions  Medication Sig Dispense Refill  . acetaminophen (TYLENOL) 325 MG tablet Take 650 mg by mouth every 6 (six) hours as  needed.      . folic acid (FOLVITE) 1 MG tablet Take 5 tablets (5 mg total) by mouth daily. Please dispense higher strengths if available  300 tablet  3  . oxyCODONE (OXY IR/ROXICODONE) 5 MG immediate release tablet Take 1 tablet (5 mg total) by mouth every 4 (four) hours as needed.  90 tablet  0  . Prenatal Vit-Fe Fumarate-FA (PRENATAL PO) Take 1 tablet by mouth daily.       No current facility-administered medications for this visit.     REVIEW OF SYSTEMS:   Constitutional: Denies fevers, chills or night sweats Eyes: Denies blurriness of vision Ears, nose, mouth, throat, and face: Denies mucositis or sore throat Respiratory: Denies cough, dyspnea or wheezes Cardiovascular: Denies palpitation, chest discomfort or lower extremity swelling Gastrointestinal:  Denies nausea, heartburn or change in bowel habits Skin: Denies abnormal skin rashes Lymphatics: Denies new lymphadenopathy or easy bruising Neurological:Denies numbness, tingling or new weaknesses Behavioral/Psych: Mood is stable, no new changes  All other systems were reviewed with the patient and are negative.  PHYSICAL EXAMINATION: ECOG PERFORMANCE STATUS: 0 - Asymptomatic  Filed Vitals:   09/15/13 0908  BP: 129/73  Pulse: 86  Temp: 98.7 F (37.1 C)  Resp: 18   Filed Weights   09/15/13 0908  Weight: 141 lb 4.8 oz (64.093 kg)    GENERAL:alert, no distress and comfortable SKIN: skin color, texture, turgor are normal, no rashes or significant lesions EYES: normal, Conjunctiva are pale and jaundiced and non-injected, sclera clear OROPHARYNX:no exudate, no erythema and lips, buccal mucosa, and tongue normal  NECK: supple, thyroid normal size, non-tender, without nodularity LYMPH:  no palpable lymphadenopathy  in the cervical, axillary or inguinal LUNGS: clear to auscultation and percussion with normal breathing effort HEART: regular rate & rhythm and no murmurs and no lower extremity edema ABDOMEN:abdomen soft,  non-tender and normal bowel sounds Musculoskeletal:no cyanosis of digits and no clubbing  NEURO: alert & oriented x 3 with fluent speech, no focal motor/sensory deficits  LABORATORY DATA:  I have reviewed the data as listed Results for orders placed in visit on 09/15/13 (from the past 48 hour(s))  HOLD TUBE, BLOOD BANK     Status: None   Collection Time    09/15/13  8:43 AM      Result Value Ref Range   Hold Tube, Blood Bank Blood Bank Order Cancelled    CBC & DIFF AND RETIC     Status: Abnormal   Collection Time    09/15/13  8:43 AM      Result Value Ref Range   WBC 19.5 (*) 3.9 - 10.3 10e3/uL   NEUT# 15.1 (*) 1.5 - 6.5 10e3/uL   HGB 8.9 (*) 11.6 - 15.9 g/dL   HCT 25.7 (*) 34.8 - 46.6 %   Platelets 378  145 - 400 10e3/uL   MCV 85.0  79.5 - 101.0 fL   MCH 29.3  25.1 - 34.0 pg   MCHC 34.5  31.5 - 36.0 g/dL   RBC 3.03 (*) 3.70 - 5.45 10e6/uL   RDW 27.8 (*) 11.2 - 14.5 %   lymph# 1.6  0.9 - 3.3 10e3/uL   MONO# 2.3 (*) 0.1 - 0.9 10e3/uL   Eosinophils Absolute 0.3  0.0 - 0.5 10e3/uL   Basophils Absolute 0.1  0.0 - 0.1 10e3/uL   NEUT% 77.4 (*) 38.4 - 76.8 %   LYMPH% 8.1 (*) 14.0 - 49.7 %   MONO% 12.0  0.0 - 14.0 %   EOS% 1.8  0.0 - 7.0 %   BASO% 0.7  0.0 - 2.0 %   nRBC 19 (*) 0 - 0 %   Retic % Sent out for confirmation.  0.70 - 2.10 %   Retic Ct Abs Sent out for confirmation.  33.70 - 90.70 10e3/uL   Immature Retic Fract Sent out for confirmation.  1.60 - 10.00 %  TECHNOLOGIST REVIEW     Status: None   Collection Time    09/15/13  8:43 AM      Result Value Ref Range   Technologist Review       Value: marked polychromasia, mod sickle cells, few target cells  COMPREHENSIVE METABOLIC PANEL (GN56)     Status: Abnormal   Collection Time    09/15/13  8:44 AM      Result Value Ref Range   Sodium 137  136 - 145 mEq/L   Potassium 3.4 (*) 3.5 - 5.1 mEq/L   Chloride 105  98 - 109 mEq/L   CO2 20 (*) 22 - 29 mEq/L   Glucose 97  70 - 140 mg/dl   BUN 4.6 (*) 7.0 - 26.0 mg/dL    Creatinine 0.5 (*) 0.6 - 1.1 mg/dL   Total Bilirubin 2.49 (*) 0.20 - 1.20 mg/dL   Alkaline Phosphatase 159 (*) 40 - 150 U/L   AST 48 (*) 5 - 34 U/L   ALT 18  0 - 55 U/L   Total Protein 7.8  6.4 - 8.3 g/dL   Albumin 3.0 (*) 3.5 - 5.0 g/dL   Calcium 8.8  8.4 - 10.4 mg/dL   Anion Gap 12 (*) 3 - 11 mEq/L  Lab Results  Component Value Date   WBC 19.5* 09/15/2013   HGB 8.9* 09/15/2013   HCT 25.7* 09/15/2013   MCV 85.0 09/15/2013   PLT 378 09/15/2013   ASSESSMENT & PLAN:  #1 sickle cell anemia #2 high risk pregnancy with history of multiple miscarriages and history of intrauterine growth retardation I recommend she return here for blood work next week and then every 2 weeks and to see me every 4 weeks. Closer to her third trimester, I will see her on a weekly basis. I establish transfusion threshold to give her blood transfusion if her hemoglobin dropped to less than 8 g. I would not recommend exchange transfusion in this situation. The risks, benefits and side effects of blood transfusion were discussed with the patient. I recommend she continues on her folic acid at 5 mg daily #3 jaundice This is due to ineffective erythropoiesis. I will continue observation only. #4 mild leukocytosis This could be related to pregnancy and her autosplenectomy state. I will observe. #5 history of pregnancy-induced hypertension I will want monitor blood pressure carefully here. Her blood pressure today is normal #6 Headache This could be due to symptoms of anemia. Continue conservative management.  All questions were answered. The patient knows to call the clinic with any problems, questions or concerns. No barriers to learning was detected.  I spent 25 minutes counseling the patient face to face. The total time spent in the appointment was 30 minutes and more than 50% was on counseling.     Heath Lark, MD 09/16/2013 8:22 AM

## 2013-09-22 ENCOUNTER — Other Ambulatory Visit: Payer: Self-pay | Admitting: Hematology and Oncology

## 2013-09-22 ENCOUNTER — Other Ambulatory Visit (HOSPITAL_BASED_OUTPATIENT_CLINIC_OR_DEPARTMENT_OTHER): Payer: Medicaid Other

## 2013-09-22 ENCOUNTER — Telehealth: Payer: Self-pay | Admitting: Hematology and Oncology

## 2013-09-22 DIAGNOSIS — D571 Sickle-cell disease 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: Secondary | ICD-10-CM

## 2013-09-22 DIAGNOSIS — O99019 Anemia complicating pregnancy, unspecified trimester: Secondary | ICD-10-CM

## 2013-09-22 DIAGNOSIS — D72819 Decreased white blood cell count, unspecified: Secondary | ICD-10-CM

## 2013-09-22 DIAGNOSIS — Z3490 Encounter for supervision of normal pregnancy, unspecified, unspecified trimester: Secondary | ICD-10-CM

## 2013-09-22 LAB — HOLD TUBE, BLOOD BANK

## 2013-09-22 LAB — RETICULOCYTES (CHCC)
ABS Retic: 571.1 10*3/uL — ABNORMAL HIGH (ref 19.0–186.0)
RBC.: 2.87 MIL/uL — ABNORMAL LOW (ref 3.87–5.11)
RETIC CT PCT: 19.9 % — AB (ref 0.4–2.3)

## 2013-09-22 LAB — CBC & DIFF AND RETIC
HCT: 23.7 % — ABNORMAL LOW (ref 34.8–46.6)
HGB: 8.3 g/dL — ABNORMAL LOW (ref 11.6–15.9)
MCH: 29.1 pg (ref 25.1–34.0)
MCHC: 35 g/dL (ref 31.5–36.0)
MCV: 83.2 fL (ref 79.5–101.0)
PLATELETS: 379 10*3/uL (ref 145–400)
RBC: 2.85 10*6/uL — AB (ref 3.70–5.45)
RDW: 25.7 % — ABNORMAL HIGH (ref 11.2–14.5)
WBC: 18.5 10*3/uL — ABNORMAL HIGH (ref 3.9–10.3)

## 2013-09-22 LAB — MANUAL DIFFERENTIAL
ALC: 2.4 10*3/uL (ref 0.9–3.3)
ANC (CHCC manual diff): 13.8 10*3/uL — ABNORMAL HIGH (ref 1.5–6.5)
BASOPHIL: 1 % (ref 0–2)
Band Neutrophils: 0 % (ref 0–10)
Blasts: 0 % (ref 0–0)
EOS%: 3 % (ref 0–7)
LYMPH: 13 % — ABNORMAL LOW (ref 14–49)
METAMYELOCYTES PCT: 0 % (ref 0–0)
MONO: 8 % (ref 0–14)
MYELOCYTES: 1 % — AB (ref 0–0)
Other Cell: 0 % (ref 0–0)
PLT EST: ADEQUATE
PROMYELO: 0 % (ref 0–0)
SEG: 74 % (ref 38–77)
VARIANT LYMPH: 0 % (ref 0–0)
nRBC: 23 % — ABNORMAL HIGH (ref 0–0)

## 2013-09-22 NOTE — Telephone Encounter (Signed)
gv and printed appts sched and avs for pt for May and June

## 2013-09-29 ENCOUNTER — Other Ambulatory Visit (HOSPITAL_BASED_OUTPATIENT_CLINIC_OR_DEPARTMENT_OTHER): Payer: Medicaid Other

## 2013-09-29 ENCOUNTER — Other Ambulatory Visit: Payer: Self-pay | Admitting: Hematology and Oncology

## 2013-09-29 ENCOUNTER — Other Ambulatory Visit: Payer: Medicaid Other

## 2013-09-29 ENCOUNTER — Ambulatory Visit (HOSPITAL_COMMUNITY)
Admission: RE | Admit: 2013-09-29 | Discharge: 2013-09-29 | Disposition: A | Discharge status: Home / Self Care | Payer: Medicaid Other | Source: Ambulatory Visit | Attending: Hematology and Oncology | Admitting: Hematology and Oncology

## 2013-09-29 ENCOUNTER — Ambulatory Visit: Payer: Medicaid Other

## 2013-09-29 VITALS — BP 113/71 | HR 80 | Temp 97.7°F | Resp 18

## 2013-09-29 DIAGNOSIS — D649 Anemia, unspecified: Secondary | ICD-10-CM

## 2013-09-29 DIAGNOSIS — D571 Sickle-cell disease 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: Secondary | ICD-10-CM

## 2013-09-29 DIAGNOSIS — D638 Anemia in other chronic diseases classified elsewhere: Secondary | ICD-10-CM

## 2013-09-29 DIAGNOSIS — Z3490 Encounter for supervision of normal pregnancy, unspecified, unspecified trimester: Secondary | ICD-10-CM

## 2013-09-29 LAB — RETICULOCYTES (CHCC)
ABS Retic: 599.2 10*3/uL — ABNORMAL HIGH (ref 19.0–186.0)
RBC.: 2.8 MIL/uL — ABNORMAL LOW (ref 3.87–5.11)
Retic Ct Pct: 21.4 % — ABNORMAL HIGH (ref 0.4–2.3)

## 2013-09-29 LAB — MANUAL DIFFERENTIAL
ALC: 2.4 10*3/uL (ref 0.9–3.3)
ANC (CHCC manual diff): 14.9 10*3/uL — ABNORMAL HIGH (ref 1.5–6.5)
Band Neutrophils: 0 % (ref 0–10)
Basophil: 0 % (ref 0–2)
Blasts: 0 % (ref 0–0)
EOS: 0 % (ref 0–7)
LYMPH: 13 % — AB (ref 14–49)
MONO: 7 % (ref 0–14)
MYELOCYTES: 0 % (ref 0–0)
Metamyelocytes: 0 % (ref 0–0)
OTHER CELL: 0 % (ref 0–0)
PLT EST: ADEQUATE
PROMYELO: 0 % (ref 0–0)
SEG: 80 % — AB (ref 38–77)
Variant Lymph: 0 % (ref 0–0)
nRBC: 33 % — ABNORMAL HIGH (ref 0–0)

## 2013-09-29 LAB — CBC & DIFF AND RETIC
HCT: 23.1 % — ABNORMAL LOW (ref 34.8–46.6)
HGB: 8.1 g/dL — ABNORMAL LOW (ref 11.6–15.9)
MCH: 29.6 pg (ref 25.1–34.0)
MCHC: 35.1 g/dL (ref 31.5–36.0)
MCV: 84.3 fL (ref 79.5–101.0)
Platelets: 369 10*3/uL (ref 145–400)
RBC: 2.74 10*6/uL — AB (ref 3.70–5.45)
RDW: 26.6 % — ABNORMAL HIGH (ref 11.2–14.5)
WBC: 18.7 10*3/uL — ABNORMAL HIGH (ref 3.9–10.3)

## 2013-09-29 LAB — HOLD TUBE, BLOOD BANK

## 2013-09-29 LAB — PREPARE RBC (CROSSMATCH)

## 2013-09-29 MED ORDER — SODIUM CHLORIDE 0.9 % IJ SOLN
10.0000 mL | INTRAMUSCULAR | Status: DC | PRN
  Filled 2013-09-29: qty 10

## 2013-09-29 NOTE — Patient Instructions (Signed)
Blood Transfusion Information WHAT IS A BLOOD TRANSFUSION? A transfusion is the replacement of blood or some of its parts. Blood is made up of multiple cells which provide different functions.  Red blood cells carry oxygen and are used for blood loss replacement.  White blood cells fight against infection.  Platelets control bleeding.  Plasma helps clot blood.  Other blood products are available for specialized needs, such as hemophilia or other clotting disorders. BEFORE THE TRANSFUSION  Who gives blood for transfusions?   You may be able to donate blood to be used at a later date on yourself (autologous donation).  Relatives can be asked to donate blood. This is generally not any safer than if you have received blood from a stranger. The same precautions are taken to ensure safety when a relative's blood is donated.  Healthy volunteers who are fully evaluated to make sure their blood is safe. This is blood bank blood. Transfusion therapy is the safest it has ever been in the practice of medicine. Before blood is taken from a donor, a complete history is taken to make sure that person has no history of diseases nor engages in risky social behavior (examples are intravenous drug use or sexual activity with multiple partners). The donor's travel history is screened to minimize risk of transmitting infections, such as malaria. The donated blood is tested for signs of infectious diseases, such as HIV and hepatitis. The blood is then tested to be sure it is compatible with you in order to minimize the chance of a transfusion reaction. If you or a relative donates blood, this is often done in anticipation of surgery and is not appropriate for emergency situations. It takes many days to process the donated blood. RISKS AND COMPLICATIONS Although transfusion therapy is very safe and saves many lives, the main dangers of transfusion include:   Getting an infectious disease.  Developing a  transfusion reaction. This is an allergic reaction to something in the blood you were given. Every precaution is taken to prevent this. The decision to have a blood transfusion has been considered carefully by your caregiver before blood is given. Blood is not given unless the benefits outweigh the risks. AFTER THE TRANSFUSION  Right after receiving a blood transfusion, you will usually feel much better and more energetic. This is especially true if your red blood cells have gotten low (anemic). The transfusion raises the level of the red blood cells which carry oxygen, and this usually causes an energy increase.  The nurse administering the transfusion will monitor you carefully for complications. HOME CARE INSTRUCTIONS  No special instructions are needed after a transfusion. You may find your energy is better. Speak with your caregiver about any limitations on activity for underlying diseases you may have. SEEK MEDICAL CARE IF:   Your condition is not improving after your transfusion.  You develop redness or irritation at the intravenous (IV) site. SEEK IMMEDIATE MEDICAL CARE IF:  Any of the following symptoms occur over the next 12 hours:  Shaking chills.  You have a temperature by mouth above 102 F (38.9 C), not controlled by medicine.  Chest, back, or muscle pain.  People around you feel you are not acting correctly or are confused.  Shortness of breath or difficulty breathing.  Dizziness and fainting.  You get a rash or develop hives.  You have a decrease in urine output.  Your urine turns a dark color or changes to pink, red, or brown. Any of the following   symptoms occur over the next 10 days:  You have a temperature by mouth above 102 F (38.9 C), not controlled by medicine.  Shortness of breath.  Weakness after normal activity.  The white part of the eye turns yellow (jaundice).  You have a decrease in the amount of urine or are urinating less often.  Your  urine turns a dark color or changes to pink, red, or brown. Document Released: 05/03/2000 Document Revised: 07/29/2011 Document Reviewed: 12/21/2007 ExitCare Patient Information 2014 [employer].  

## 2013-09-30 ENCOUNTER — Telehealth: Payer: Self-pay | Admitting: *Deleted

## 2013-09-30 ENCOUNTER — Encounter: Payer: Self-pay | Admitting: Family Medicine

## 2013-09-30 ENCOUNTER — Ambulatory Visit (INDEPENDENT_AMBULATORY_CARE_PROVIDER_SITE_OTHER): Payer: Medicaid Other | Admitting: Family Medicine

## 2013-09-30 VITALS — BP 117/76 | HR 89 | Temp 97.0°F | Wt 139.5 lb

## 2013-09-30 DIAGNOSIS — D571 Sickle-cell disease 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: Secondary | ICD-10-CM

## 2013-09-30 DIAGNOSIS — O09529 Supervision of elderly multigravida, unspecified trimester: Secondary | ICD-10-CM

## 2013-09-30 DIAGNOSIS — O34219 Maternal care for unspecified type scar from previous cesarean delivery: Secondary | ICD-10-CM

## 2013-09-30 DIAGNOSIS — O09299 Supervision of pregnancy with other poor reproductive or obstetric history, unspecified trimester: Secondary | ICD-10-CM

## 2013-09-30 LAB — POCT URINALYSIS DIP (DEVICE)
BILIRUBIN URINE: NEGATIVE
GLUCOSE, UA: NEGATIVE mg/dL
Hgb urine dipstick: NEGATIVE
KETONES UR: NEGATIVE mg/dL
LEUKOCYTES UA: NEGATIVE
NITRITE: NEGATIVE
Protein, ur: NEGATIVE mg/dL
Specific Gravity, Urine: 1.015 (ref 1.005–1.030)
Urobilinogen, UA: 2 mg/dL — ABNORMAL HIGH (ref 0.0–1.0)
pH: 7 (ref 5.0–8.0)

## 2013-09-30 LAB — TYPE AND SCREEN
ABO/RH(D): B POS
Antibody Screen: NEGATIVE
Unit division: 0
Unit division: 0

## 2013-09-30 NOTE — Telephone Encounter (Signed)
Message copied by Cathlean Cower on Thu Sep 30, 2013  8:49 AM ------      Message from: [facility], [place]: Wed Sep 29, 2013  5:16 PM      Regarding: RE: Lab appt       2 weeks appt since she got transfused today      ----- Message -----         From: Cathlean Cower, RN         Sent: 09/29/2013   3:56 PM           To: Heath Lark, MD      Subject: Lab appt                                                 [name] got 2 units of blood today.  Does she need to come back in one week as scheduled for blood or can she wait another week?  thanks       ------

## 2013-09-30 NOTE — Telephone Encounter (Signed)
Informed pt of order sent to scheduler to change lab appt from 5/20 to 5/27 and to expect call from our scheduler.  She verbalized understanding.

## 2013-09-30 NOTE — Progress Notes (Signed)
Patient reports continued headaches with dizziness

## 2013-09-30 NOTE — Progress Notes (Signed)
Headaches and dizziness--s/p transfusion on 5/13 Pain medicine helps with headache. Nml anatomy-f/u growth on tomorrow.

## 2013-09-30 NOTE — Patient Instructions (Signed)
Second Trimester of Pregnancy The second trimester is from week 13 through week 28, months 4 through 6. The second trimester is often a time when you feel your best. Your body has also adjusted to being pregnant, and you begin to feel better physically. Usually, morning sickness has lessened or quit completely, you may have more energy, and you may have an increase in appetite. The second trimester is also a time when the fetus is growing rapidly. At the end of the sixth month, the fetus is about 9 inches long and weighs about 1 pounds. You will likely begin to feel the baby move (quickening) between 18 and 20 weeks of the pregnancy. BODY CHANGES Your body goes through many changes during pregnancy. The changes vary from woman to woman.   Your weight will continue to increase. You will notice your lower abdomen bulging out.  You may begin to get stretch marks on your hips, abdomen, and breasts.  You may develop headaches that can be relieved by medicines approved by your caregiver.  You may urinate more often because the fetus is pressing on your bladder.  You may develop or continue to have heartburn as a result of your pregnancy.  You may develop constipation because certain hormones are causing the muscles that push waste through your intestines to slow down.  You may develop hemorrhoids or swollen, bulging veins (varicose veins).  You may have back pain because of the weight gain and pregnancy hormones relaxing your joints between the bones in your pelvis and as a result of a shift in weight and the muscles that support your balance.  Your breasts will continue to grow and be tender.  Your gums may bleed and may be sensitive to brushing and flossing.  Dark spots or blotches (chloasma, mask of pregnancy) may develop on your face. This will likely fade after the baby is born.  A dark line from your belly button to the pubic area (linea nigra) may appear. This will likely fade after  the baby is born. WHAT TO EXPECT AT YOUR PRENATAL VISITS During a routine prenatal visit:  You will be weighed to make sure you and the fetus are growing normally.  Your blood pressure will be taken.  Your abdomen will be measured to track your baby's growth.  The fetal heartbeat will be listened to.  Any test results from the previous visit will be discussed. Your caregiver may ask you:  How you are feeling.  If you are feeling the baby move.  If you have had any abnormal symptoms, such as leaking fluid, bleeding, severe headaches, or abdominal cramping.  If you have any questions. Other tests that may be performed during your second trimester include:  Blood tests that check for:  Low iron levels (anemia).  Gestational diabetes (between 24 and 28 weeks).  Rh antibodies.  Urine tests to check for infections, diabetes, or protein in the urine.  An ultrasound to confirm the proper growth and development of the baby.  An amniocentesis to check for possible genetic problems.  Fetal screens for spina bifida and Down syndrome. HOME CARE INSTRUCTIONS   Avoid all smoking, herbs, alcohol, and unprescribed drugs. These chemicals affect the formation and growth of the baby.  Follow your caregiver's instructions regarding medicine use. There are medicines that are either safe or unsafe to take during pregnancy.  Exercise only as directed by your caregiver. Experiencing uterine cramps is a good sign to stop exercising.  Continue to eat regular,  healthy meals.  Wear a good support bra for breast tenderness.  Do not use hot tubs, steam rooms, or saunas.  Wear your seat belt at all times when driving.  Avoid raw meat, uncooked cheese, cat litter boxes, and soil used by cats. These carry germs that can cause birth defects in the baby.  Take your prenatal vitamins.  Try taking a stool softener (if your caregiver approves) if you develop constipation. Eat more high-fiber  foods, such as fresh vegetables or fruit and whole grains. Drink plenty of fluids to keep your urine clear or pale yellow.  Take warm sitz baths to soothe any pain or discomfort caused by hemorrhoids. Use hemorrhoid cream if your caregiver approves.  If you develop varicose veins, wear support hose. Elevate your feet for 15 minutes, 3 4 times a day. Limit salt in your diet.  Avoid heavy lifting, wear low heel shoes, and practice good posture.  Rest with your legs elevated if you have leg cramps or low back pain.  Visit your dentist if you have not gone yet during your pregnancy. Use a soft toothbrush to brush your teeth and be gentle when you floss.  A sexual relationship may be continued unless your caregiver directs you otherwise.  Continue to go to all your prenatal visits as directed by your caregiver. SEEK MEDICAL CARE IF:   You have dizziness.  You have mild pelvic cramps, pelvic pressure, or nagging pain in the abdominal area.  You have persistent nausea, vomiting, or diarrhea.  You have a bad smelling vaginal discharge.  You have pain with urination. SEEK IMMEDIATE MEDICAL CARE IF:   You have a fever.  You are leaking fluid from your vagina.  You have spotting or bleeding from your vagina.  You have severe abdominal cramping or pain.  You have rapid weight gain or loss.  You have shortness of breath with chest pain.  You notice sudden or extreme swelling of your face, hands, ankles, feet, or legs.  You have not felt your baby move in over an hour.  You have severe headaches that do not go away with medicine.  You have vision changes. Document Released: 04/30/2001 Document Revised: 01/06/2013 Document Reviewed: 07/07/2012 [facility] Patient Information 2014 [place].  Breastfeeding Deciding to breastfeed is one of the best choices you can make for you and your baby. A change in hormones during pregnancy causes your breast tissue to grow and increases the  number and size of your milk ducts. These hormones also allow proteins, sugars, and fats from your blood supply to make breast milk in your milk-producing glands. Hormones prevent breast milk from being released before your baby is born as well as prompt milk flow after birth. Once breastfeeding has begun, thoughts of your baby, as well as his or her sucking or crying, can stimulate the release of milk from your milk-producing glands.  BENEFITS OF BREASTFEEDING For Your Baby  Your first milk (colostrum) helps your baby's digestive system function better.   There are antibodies in your milk that help your baby fight off infections.   Your baby has a lower incidence of asthma, allergies, and sudden infant death syndrome.   The nutrients in breast milk are better for your baby than infant formulas and are designed uniquely for your baby's needs.   Breast milk improves your baby's brain development.   Your baby is less likely to develop other conditions, such as childhood obesity, asthma, or type 2 diabetes mellitus.  For  You   Breastfeeding helps to create a very special bond between you and your baby.   Breastfeeding is convenient. Breast milk is always available at the correct temperature and costs nothing.   Breastfeeding helps to burn calories and helps you lose the weight gained during pregnancy.   Breastfeeding makes your uterus contract to its prepregnancy size faster and slows bleeding (lochia) after you give birth.   Breastfeeding helps to lower your risk of developing type 2 diabetes mellitus, osteoporosis, and breast or ovarian cancer later in life. SIGNS THAT YOUR BABY IS HUNGRY Early Signs of Hunger  Increased alertness or activity.  Stretching.  Movement of the head from side to side.  Movement of the head and opening of the mouth when the corner of the mouth or cheek is stroked (rooting).  Increased sucking sounds, smacking lips, cooing, sighing, or  squeaking.  Hand-to-mouth movements.  Increased sucking of fingers or hands. Late Signs of Hunger  Fussing.  Intermittent crying. Extreme Signs of Hunger Signs of extreme hunger will require calming and consoling before your baby will be able to breastfeed successfully. Do not wait for the following signs of extreme hunger to occur before you initiate breastfeeding:   Restlessness.  A loud, strong cry.   Screaming. BREASTFEEDING BASICS Breastfeeding Initiation  Find a comfortable place to sit or lie down, with your neck and back well supported.  Place a pillow or rolled up blanket under your baby to bring him or her to the level of your breast (if you are seated). Nursing pillows are specially designed to help support your arms and your baby while you breastfeed.  Make sure that your baby's abdomen is facing your abdomen.   Gently massage your breast. With your fingertips, massage from your chest wall toward your nipple in a circular motion. This encourages milk flow. You may need to continue this action during the feeding if your milk flows slowly.  Support your breast with 4 fingers underneath and your thumb above your nipple. Make sure your fingers are well away from your nipple and your baby's mouth.   Stroke your baby's lips gently with your finger or nipple.   When your baby's mouth is open wide enough, quickly bring your baby to your breast, placing your entire nipple and as much of the colored area around your nipple (areola) as possible into your baby's mouth.   More areola should be visible above your baby's upper lip than below the lower lip.   Your baby's tongue should be between his or her lower gum and your breast.   Ensure that your baby's mouth is correctly positioned around your nipple (latched). Your baby's lips should create a seal on your breast and be turned out (everted).  It is common for your baby to suck about 2 3 minutes in order to start the  flow of breast milk. Latching Teaching your baby how to latch on to your breast properly is very important. An improper latch can cause nipple pain and decreased milk supply for you and poor weight gain in your baby. Also, if your baby is not latched onto your nipple properly, he or she may swallow some air during feeding. This can make your baby fussy. Burping your baby when you switch breasts during the feeding can help to get rid of the air. However, teaching your baby to latch on properly is still the best way to prevent fussiness from swallowing air while breastfeeding. Signs that your baby has  successfully latched on to your nipple:    Silent tugging or silent sucking, without causing you pain.   Swallowing heard between every 3 4 sucks.    Muscle movement above and in front of his or her ears while sucking.  Signs that your baby has not successfully latched on to nipple:   Sucking sounds or smacking sounds from your baby while breastfeeding.  Nipple pain. If you think your baby has not latched on correctly, slip your finger into the corner of your baby's mouth to break the suction and place it between your baby's gums. Attempt breastfeeding initiation again. Signs of Successful Breastfeeding Signs from your baby:   A gradual decrease in the number of sucks or complete cessation of sucking.   Falling asleep.   Relaxation of his or her body.   Retention of a small amount of milk in his or her mouth.   Letting go of your breast by himself or herself. Signs from you:  Breasts that have increased in firmness, weight, and size 1 3 hours after feeding.   Breasts that are softer immediately after breastfeeding.  Increased milk volume, as well as a change in milk consistency and color by the 5th day of breastfeeding.   Nipples that are not sore, cracked, or bleeding. Signs That Your [employer]el Books is Getting Enough Milk  Wetting at least 3 diapers in a 24-hour period. The urine  should be clear and pale yellow by age 64 days.  At least 3 stools in a 24-hour period by age 64 days. The stool should be soft and yellow.  At least 3 stools in a 24-hour period by age 616 days. The stool should be seedy and yellow.  No loss of weight greater than 10% of birth weight during the first 91 days of age.  Average weight gain of 4 7 ounces (120 210 mL) per week after age 61 days.  Consistent daily weight gain by age 65 days, without weight loss after the age of 2 weeks. After a feeding, your baby may spit up a small amount. This is common. BREASTFEEDING FREQUENCY AND DURATION Frequent feeding will help you make more milk and can prevent sore nipples and breast engorgement. Breastfeed when you feel the need to reduce the fullness of your breasts or when your baby shows signs of hunger. This is called "breastfeeding on demand." Avoid introducing a pacifier to your baby while you are working to establish breastfeeding (the first 4 6 weeks after your baby is born). After this time you may choose to use a pacifier. Research has shown that pacifier use during the first year of a baby's life decreases the risk of sudden infant death syndrome (SIDS). Allow your baby to feed on each breast as long as he or she wants. Breastfeed until your baby is finished feeding. When your baby unlatches or falls asleep while feeding from the first breast, offer the second breast. Because newborns are often sleepy in the first few weeks of life, you may need to awaken your baby to get him or her to feed. Breastfeeding times will vary from baby to baby. However, the following rules can serve as a guide to help you ensure that your baby is properly fed:  Newborns (babies 53 weeks of age or younger) may breastfeed every 1 3 hours.  Newborns should not go longer than 3 hours during the day or 5 hours during the night without breastfeeding.  You should breastfeed your baby a minimum of  8 times in a 24-hour period until  you begin to introduce solid foods to your baby at around 55 months of age. BREAST MILK PUMPING Pumping and storing breast milk allows you to ensure that your baby is exclusively fed your breast milk, even at times when you are unable to breastfeed. This is especially important if you are going back to work while you are still breastfeeding or when you are not able to be present during feedings. Your lactation consultant can give you guidelines on how long it is safe to store breast milk.  A breast pump is a machine that allows you to pump milk from your breast into a sterile bottle. The pumped breast milk can then be stored in a refrigerator or freezer. Some breast pumps are operated by hand, while others use electricity. Ask your lactation consultant which type will work best for you. Breast pumps can be purchased, but some hospitals and breastfeeding support groups lease breast pumps on a monthly basis. A lactation consultant can teach you how to hand express breast milk, if you prefer not to use a pump.  CARING FOR YOUR BREASTS WHILE YOU BREASTFEED Nipples can become dry, cracked, and sore while breastfeeding. The following recommendations can help keep your breasts moisturized and healthy:  Avoid using soap on your nipples.   Wear a supportive bra. Although not required, special nursing bras and tank tops are designed to allow access to your breasts for breastfeeding without taking off your entire bra or top. Avoid wearing underwire style bras or extremely tight bras.  Air dry your nipples for 3 4minutes after each feeding.   Use only cotton bra pads to absorb leaked breast milk. Leaking of breast milk between feedings is normal.   Use lanolin on your nipples after breastfeeding. Lanolin helps to maintain your skin's normal moisture barrier. If you use pure lanolin you do not need to wash it off before feeding your baby again. Pure lanolin is not toxic to your baby. You may also hand express a  few drops of breast milk and gently massage that milk into your nipples and allow the milk to air dry. In the first few weeks after giving birth, some women experience extremely full breasts (engorgement). Engorgement can make your breasts feel heavy, warm, and tender to the touch. Engorgement peaks within 3 5 days after you give birth. The following recommendations can help ease engorgement:  Completely empty your breasts while breastfeeding or pumping. You may want to start by applying warm, moist heat (in the shower or with warm water-soaked hand towels) just before feeding or pumping. This increases circulation and helps the milk flow. If your baby does not completely empty your breasts while breastfeeding, pump any extra milk after he or she is finished.  Wear a snug bra (nursing or regular) or tank top for 1 2 days to signal your body to slightly decrease milk production.  Apply ice packs to your breasts, unless this is too uncomfortable for you.  Make sure that your baby is latched on and positioned properly while breastfeeding. If engorgement persists after 48 hours of following these recommendations, contact your health care provider or a Science writer. OVERALL HEALTH CARE RECOMMENDATIONS WHILE BREASTFEEDING  Eat healthy foods. Alternate between meals and snacks, eating 3 of each per day. Because what you eat affects your breast milk, some of the foods may make your baby more irritable than usual. Avoid eating these foods if you are sure that they are  negatively affecting your baby.  Drink milk, fruit juice, and water to satisfy your thirst (about 10 glasses a day).   Rest often, relax, and continue to take your prenatal vitamins to prevent fatigue, stress, and anemia.  Continue breast self-awareness checks.  Avoid chewing and smoking tobacco.  Avoid alcohol and drug use. Some medicines that may be harmful to your baby can pass through breast milk. It is important to ask your  health care provider before taking any medicine, including all over-the-counter and prescription medicine as well as vitamin and herbal supplements. It is possible to become pregnant while breastfeeding. If birth control is desired, ask your health care provider about options that will be safe for your baby. SEEK MEDICAL CARE IF:   You feel like you want to stop breastfeeding or have become frustrated with breastfeeding.  You have painful breasts or nipples.  Your nipples are cracked or bleeding.  Your breasts are red, tender, or warm.  You have a swollen area on either breast.  You have a fever or chills.  You have nausea or vomiting.  You have drainage other than breast milk from your nipples.  Your breasts do not become full before feedings by the 5th day after you give birth.  You feel sad and depressed.  Your baby is too sleepy to eat well.  Your baby is having trouble sleeping.   Your baby is wetting less than 3 diapers in a 24-hour period.  Your baby has less than 3 stools in a 24-hour period.  Your baby's skin or the white part of his or her eyes becomes yellow.   Your baby is not gaining weight by 5 days of age. SEEK IMMEDIATE MEDICAL CARE IF:   Your baby is overly tired (lethargic) and does not want to wake up and feed.  Your baby develops an unexplained fever. Document Released: 05/06/2005 Document Revised: 01/06/2013 Document Reviewed: 10/28/2012 [employer] Patient Information 2014 [employer].  

## 2013-10-01 ENCOUNTER — Encounter (HOSPITAL_COMMUNITY): Payer: Self-pay

## 2013-10-01 ENCOUNTER — Other Ambulatory Visit (HOSPITAL_COMMUNITY): Payer: Self-pay | Admitting: Obstetrics and Gynecology

## 2013-10-01 ENCOUNTER — Ambulatory Visit (HOSPITAL_COMMUNITY)
Admission: RE | Admit: 2013-10-01 | Discharge: 2013-10-01 | Disposition: A | Discharge status: Home / Self Care | Payer: Medicaid Other | Source: Ambulatory Visit | Attending: Internal Medicine | Admitting: Internal Medicine

## 2013-10-01 VITALS — BP 125/79 | HR 96 | Wt 142.0 lb

## 2013-10-01 DIAGNOSIS — O09529 Supervision of elderly multigravida, unspecified trimester: Secondary | ICD-10-CM | POA: Insufficient documentation

## 2013-10-01 DIAGNOSIS — Z8751 Personal history of pre-term labor: Secondary | ICD-10-CM | POA: Insufficient documentation

## 2013-10-01 DIAGNOSIS — D571 Sickle-cell disease 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: Secondary | ICD-10-CM

## 2013-10-01 DIAGNOSIS — O34219 Maternal care for unspecified type scar from previous cesarean delivery: Secondary | ICD-10-CM

## 2013-10-01 DIAGNOSIS — O99019 Anemia complicating pregnancy, unspecified trimester: Secondary | ICD-10-CM | POA: Insufficient documentation

## 2013-10-01 DIAGNOSIS — O09299 Supervision of pregnancy with other poor reproductive or obstetric history, unspecified trimester: Secondary | ICD-10-CM

## 2013-10-02 ENCOUNTER — Telehealth: Payer: Self-pay | Admitting: Hematology and Oncology

## 2013-10-02 NOTE — Telephone Encounter (Signed)
s.w. pt and advised on May lab moved to 5.27.Marland KitchenMarland Kitchenpt ok and aware

## 2013-10-06 ENCOUNTER — Other Ambulatory Visit: Payer: PRIVATE HEALTH INSURANCE

## 2013-10-13 ENCOUNTER — Telehealth: Payer: Self-pay | Admitting: *Deleted

## 2013-10-13 ENCOUNTER — Other Ambulatory Visit (HOSPITAL_BASED_OUTPATIENT_CLINIC_OR_DEPARTMENT_OTHER): Payer: Medicaid Other

## 2013-10-13 DIAGNOSIS — Z3490 Encounter for supervision of normal pregnancy, unspecified, unspecified trimester: Secondary | ICD-10-CM

## 2013-10-13 DIAGNOSIS — D571 Sickle-cell disease 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: Secondary | ICD-10-CM

## 2013-10-13 DIAGNOSIS — D649 Anemia, unspecified: Secondary | ICD-10-CM

## 2013-10-13 DIAGNOSIS — O99019 Anemia complicating pregnancy, unspecified trimester: Secondary | ICD-10-CM

## 2013-10-13 LAB — CBC & DIFF AND RETIC
BASO%: 0.7 % (ref 0.0–2.0)
BASOS ABS: 0.1 10*3/uL (ref 0.0–0.1)
EOS%: 1.6 % (ref 0.0–7.0)
Eosinophils Absolute: 0.3 10*3/uL (ref 0.0–0.5)
HEMATOCRIT: 24.2 % — AB (ref 34.8–46.6)
HEMOGLOBIN: 8.5 g/dL — AB (ref 11.6–15.9)
LYMPH%: 13.1 % — ABNORMAL LOW (ref 14.0–49.7)
MCH: 29.1 pg (ref 25.1–34.0)
MCHC: 35.1 g/dL (ref 31.5–36.0)
MCV: 82.9 fL (ref 79.5–101.0)
MONO#: 2.4 10*3/uL — AB (ref 0.1–0.9)
MONO%: 13 % (ref 0.0–14.0)
NEUT%: 71.6 % (ref 38.4–76.8)
NEUTROS ABS: 13.5 10*3/uL — AB (ref 1.5–6.5)
Platelets: 442 10*3/uL — ABNORMAL HIGH (ref 145–400)
RBC: 2.92 10*6/uL — ABNORMAL LOW (ref 3.70–5.45)
RDW: 23.3 % — AB (ref 11.2–14.5)
WBC: 18.8 10*3/uL — AB (ref 3.9–10.3)
lymph#: 2.5 10*3/uL (ref 0.9–3.3)
nRBC: 28 % — ABNORMAL HIGH (ref 0–0)

## 2013-10-13 LAB — RETICULOCYTES (CHCC)
ABS Retic: 602.9 10*3/uL — ABNORMAL HIGH (ref 19.0–186.0)
RBC.: 2.97 MIL/uL — AB (ref 3.87–5.11)
Retic Ct Pct: 20.3 % — ABNORMAL HIGH (ref 0.4–2.3)

## 2013-10-13 NOTE — Telephone Encounter (Signed)
Informed pt of Hgb 8.5 today and no need for transfusion.  Keep appt next week on 6/04 as scheduled. She verbalized understanding.

## 2013-10-21 ENCOUNTER — Encounter: Payer: Self-pay | Admitting: Hematology and Oncology

## 2013-10-21 ENCOUNTER — Ambulatory Visit (HOSPITAL_BASED_OUTPATIENT_CLINIC_OR_DEPARTMENT_OTHER): Payer: Medicaid Other | Admitting: Hematology and Oncology

## 2013-10-21 ENCOUNTER — Other Ambulatory Visit (HOSPITAL_BASED_OUTPATIENT_CLINIC_OR_DEPARTMENT_OTHER): Payer: Medicaid Other

## 2013-10-21 ENCOUNTER — Telehealth: Payer: Self-pay | Admitting: Hematology and Oncology

## 2013-10-21 ENCOUNTER — Ambulatory Visit (INDEPENDENT_AMBULATORY_CARE_PROVIDER_SITE_OTHER): Payer: PRIVATE HEALTH INSURANCE | Admitting: Obstetrics & Gynecology

## 2013-10-21 VITALS — BP 129/69 | HR 88 | Temp 97.5°F | Wt 142.2 lb

## 2013-10-21 VITALS — BP 119/65 | HR 91 | Temp 97.9°F | Resp 20 | Ht 67.0 in | Wt 142.8 lb

## 2013-10-21 DIAGNOSIS — G8929 Other chronic pain: Secondary | ICD-10-CM

## 2013-10-21 DIAGNOSIS — D571 Sickle-cell disease 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: Secondary | ICD-10-CM

## 2013-10-21 DIAGNOSIS — R5381 Other malaise: Secondary | ICD-10-CM

## 2013-10-21 DIAGNOSIS — D5700 Hb-SS disease with crisis, unspecified: Secondary | ICD-10-CM

## 2013-10-21 DIAGNOSIS — D649 Anemia, unspecified: Secondary | ICD-10-CM

## 2013-10-21 DIAGNOSIS — O9989 Other specified diseases and conditions complicating pregnancy, childbirth and the puerperium: Secondary | ICD-10-CM

## 2013-10-21 DIAGNOSIS — D72829 Elevated white blood cell count, unspecified: Secondary | ICD-10-CM

## 2013-10-21 DIAGNOSIS — G894 Chronic pain syndrome: Secondary | ICD-10-CM

## 2013-10-21 DIAGNOSIS — Z3490 Encounter for supervision of normal pregnancy, unspecified, unspecified trimester: Secondary | ICD-10-CM

## 2013-10-21 DIAGNOSIS — O09299 Supervision of pregnancy with other poor reproductive or obstetric history, unspecified trimester: Secondary | ICD-10-CM

## 2013-10-21 DIAGNOSIS — R5383 Other fatigue: Secondary | ICD-10-CM

## 2013-10-21 DIAGNOSIS — O99891 Other specified diseases and conditions complicating pregnancy: Secondary | ICD-10-CM

## 2013-10-21 DIAGNOSIS — M2550 Pain in unspecified joint: Secondary | ICD-10-CM

## 2013-10-21 LAB — CBC & DIFF AND RETIC
BASO%: 0.7 % (ref 0.0–2.0)
Basophils Absolute: 0.1 10*3/uL (ref 0.0–0.1)
EOS%: 2.1 % (ref 0.0–7.0)
Eosinophils Absolute: 0.4 10*3/uL (ref 0.0–0.5)
HEMATOCRIT: 24.7 % — AB (ref 34.8–46.6)
HGB: 8.8 g/dL — ABNORMAL LOW (ref 11.6–15.9)
LYMPH%: 11.2 % — AB (ref 14.0–49.7)
MCH: 31.3 pg (ref 25.1–34.0)
MCHC: 35.4 g/dL (ref 31.5–36.0)
MCV: 88.5 fL (ref 79.5–101.0)
MONO#: 2.1 10*3/uL — AB (ref 0.1–0.9)
MONO%: 12.2 % (ref 0.0–14.0)
NEUT#: 12.7 10*3/uL — ABNORMAL HIGH (ref 1.5–6.5)
NEUT%: 73.8 % (ref 38.4–76.8)
PLATELETS: 357 10*3/uL (ref 145–400)
RBC: 2.8 10*6/uL — AB (ref 3.70–5.45)
RDW: 25.8 % — ABNORMAL HIGH (ref 11.2–14.5)
WBC: 17.2 10*3/uL — AB (ref 3.9–10.3)
lymph#: 1.9 10*3/uL (ref 0.9–3.3)

## 2013-10-21 LAB — RETICULOCYTES (CHCC)
ABS Retic: 613.4 10*3/uL — ABNORMAL HIGH (ref 19.0–186.0)
RBC.: 2.84 MIL/uL — ABNORMAL LOW (ref 3.87–5.11)
Retic Ct Pct: 21.6 % — ABNORMAL HIGH (ref 0.4–2.3)

## 2013-10-21 LAB — POCT URINALYSIS DIP (DEVICE)
BILIRUBIN URINE: NEGATIVE
Glucose, UA: NEGATIVE mg/dL
HGB URINE DIPSTICK: NEGATIVE
KETONES UR: NEGATIVE mg/dL
Leukocytes, UA: NEGATIVE
Nitrite: NEGATIVE
PH: 7 (ref 5.0–8.0)
PROTEIN: NEGATIVE mg/dL
SPECIFIC GRAVITY, URINE: 1.015 (ref 1.005–1.030)
Urobilinogen, UA: 1 mg/dL (ref 0.0–1.0)

## 2013-10-21 LAB — TECHNOLOGIST REVIEW

## 2013-10-21 LAB — HOLD TUBE, BLOOD BANK

## 2013-10-21 MED ORDER — OXYCODONE HCL 5 MG PO TABS: 5.0000 mg | ORAL_TABLET | ORAL | Status: DC | PRN

## 2013-10-21 NOTE — Progress Notes (Signed)
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 According to the patient, she is pregnant again, with a due date expected on 02/02/2014. Previously, when she was pregnant with her daughter, she had severe hypertension, and intrauterine growth retardation of her daughter and her baby was delivered with C-section at 86 weeks. Her daughter is currently healthy. With her sickle cell disease, she had recurrent admission to the hospital 2-3 times a year for painful chest crisis. She was also known to have avascular necrosis of hips but never required any joint surgery. She never developed history of stroke. She had numerous transfusions in the past without complications. The patient does not take hydroxyurea when she was not pregnant. She was found to have abnormal CBC from her routine blood work recently, the last time she had exchange transfusion a few months ago for anemia of 7.9 g  Pregnancy is progressing well. Her main complaint consistent with fatigue and joint pain. She denies any shortness of breath.  I have reviewed the past medical history, past surgical history, social history and family history with the patient and they are unchanged from previous note.  ALLERGIES:  has No Known Allergies.  MEDICATIONS:  Current Outpatient Prescriptions  Medication Sig Dispense Refill  . acetaminophen (TYLENOL) 325 MG tablet Take 650 mg by mouth every 6 (six) hours as needed.      . folic acid (FOLVITE) 1 MG tablet Take 5 tablets (5 mg total) by mouth daily. Please dispense higher strengths if available  300 tablet  3  . oxyCODONE (OXY IR/ROXICODONE) 5 MG immediate release tablet Take 1  tablet (5 mg total) by mouth every 4 (four) hours as needed for severe pain.  60 tablet  0  . Prenatal Vit-Fe Fumarate-FA (PRENATAL PO) Take 1 tablet by mouth daily.       No current facility-administered medications for this visit.   Facility-Administered Medications Ordered in Other Visits  Medication Dose Route Frequency Provider Last Rate Last Dose  . sodium chloride 0.9 % injection 10 mL  10 mL Intracatheter PRN Heath Lark, MD         REVIEW OF SYSTEMS:   Constitutional: Denies fevers, chills or night sweats Eyes: Denies blurriness of vision Ears, nose, mouth, throat, and face: Denies mucositis or sore throat Respiratory: Denies cough, dyspnea or wheezes Cardiovascular: Denies palpitation, chest discomfort or lower extremity swelling Gastrointestinal:  Denies nausea, heartburn or change in bowel habits Skin: Denies abnormal skin rashes Lymphatics: Denies new lymphadenopathy or easy bruising Neurological:Denies numbness, tingling or new weaknesses Behavioral/Psych: Mood is stable, no new changes  All other systems were reviewed with the patient and are negative.  PHYSICAL EXAMINATION: ECOG PERFORMANCE STATUS: 1 - Symptomatic but completely ambulatory  Filed Vitals:   10/21/13 0904  BP: 119/65  Pulse: 91  Temp: 97.9 F (36.6 C)  Resp: 20   Filed Weights   10/21/13 0904  Weight: 142 lb 12.8 oz (64.774 kg)    GENERAL:alert, no distress and comfortable SKIN: skin color, texture, turgor are normal, no rashes or significant lesions EYES: normal, Conjunctiva are pale and jaundiced. OROPHARYNX:no exudate, no erythema and lips, buccal mucosa, and tongue normal  NECK: supple, thyroid normal size,  non-tender, without nodularity LYMPH:  no palpable lymphadenopathy in the cervical, axillary or inguinal LUNGS: clear to auscultation and percussion with normal breathing effort HEART: regular rate & rhythm with a soft murmur on the left sternal border. no lower extremity  edema ABDOMEN:abdomen soft, non-tender and normal bowel sounds Musculoskeletal:no cyanosis of digits and no clubbing  NEURO: alert & oriented x 3 with fluent speech, no focal motor/sensory deficits  LABORATORY DATA:  I have reviewed the data as listed Results for orders placed in visit on 10/21/13 (from the past 48 hour(s))  HOLD TUBE, BLOOD BANK     Status: None   Collection Time    10/21/13  8:45 AM      Result Value Ref Range   Hold Tube, Blood Bank Blood Bank Order Cancelled    CBC & DIFF AND RETIC     Status: Abnormal   Collection Time    10/21/13  8:45 AM      Result Value Ref Range   WBC 17.2 (*) 3.9 - 10.3 10e3/uL   NEUT# 12.7 (*) 1.5 - 6.5 10e3/uL   HGB 8.8 (*) 11.6 - 15.9 g/dL   HCT 24.7 (*) 34.8 - 46.6 %   Platelets 357  145 - 400 10e3/uL   MCV 88.5  79.5 - 101.0 fL   MCH 31.3  25.1 - 34.0 pg   MCHC 35.4  31.5 - 36.0 g/dL   RBC 2.80 (*) 3.70 - 5.45 10e6/uL   RDW 25.8 (*) 11.2 - 14.5 %   lymph# 1.9  0.9 - 3.3 10e3/uL   MONO# 2.1 (*) 0.1 - 0.9 10e3/uL   Eosinophils Absolute 0.4  0.0 - 0.5 10e3/uL   Basophils Absolute 0.1  0.0 - 0.1 10e3/uL   NEUT% 73.8  38.4 - 76.8 %   LYMPH% 11.2 (*) 14.0 - 49.7 %   MONO% 12.2  0.0 - 14.0 %   EOS% 2.1  0.0 - 7.0 %   BASO% 0.7  0.0 - 2.0 %   Retic Comment Sent out for confirmation.     Comment: Note New Reference Ranges.  RETICULOCYTE COUNT (SLN)     Status: Abnormal   Collection Time    10/21/13  8:45 AM      Result Value Ref Range   Retic Ct Pct 21.6 (*) 0.4 - 2.3 %   RBC. 2.84 (*) 3.87 - 5.11 MIL/uL   ABS Retic 613.4 (*) 19.0 - 186.0 K/uL  TECHNOLOGIST REVIEW     Status: None   Collection Time    10/21/13  8:45 AM      Result Value Ref Range   Technologist Review 49%NRBCs, many sickle cells      Lab Results  Component Value Date   WBC 17.2* 10/21/2013   HGB 8.8* 10/21/2013   HCT 24.7* 10/21/2013   MCV 88.5 10/21/2013   PLT 357 10/21/2013    ASSESSMENT & PLAN:  Sickle cell anemia This patient has no recent acute  crisis. She has robust reticulocytosis. I recommend continue high-dose folic acid supplementation. The patient will continue to return here every other week with plan for blood t transfusion if her hemoglobin gets close to 8 g.  High risk pregnancy due to history of previous obstetrical problem Overall, her pregnancy is stable. I plan to continue transfusion support as needed. I would not recommend exchange transfusion right now.  Chronic pain syndrome She has severe joint pain. She requests a prescription for oxycodone. Due to her current pregnancy state, I  recommend should avoid taking oxycodone if possible. I gave her prescription for 60 tablets of oxycodone to use as needed until her next visit. I warned her risk of constipation.  Leukocytosis, unspecified This is likely related to stress response and her pregnancy state. She has no signs and symptoms of infection. Continue close monitoring.    All questions were answered. The patient knows to call the clinic with any problems, questions or concerns. No barriers to learning was detected.    Heath Lark, MD 10/21/2013 8:45 PM

## 2013-10-21 NOTE — Assessment & Plan Note (Signed)
This is likely related to stress response and her pregnancy state. She has no signs and symptoms of infection. Continue close monitoring.   

## 2013-10-21 NOTE — Progress Notes (Signed)
Has mid back pain.  No contractions.  No complaints.  No questions.  Suggested prenatal massage and a pregnancy belt Pt not having sickle cell pain.  Followed by dr. Zigmund Daniel.

## 2013-10-21 NOTE — Progress Notes (Signed)
Pain-lower back

## 2013-10-21 NOTE — Assessment & Plan Note (Signed)
Overall, her pregnancy is stable. I plan to continue transfusion support as needed. I would not recommend exchange transfusion right now.   

## 2013-10-21 NOTE — Telephone Encounter (Signed)
gv and printed appt sched na davs for pt for June and JUly

## 2013-10-21 NOTE — Assessment & Plan Note (Signed)
This patient has no recent acute crisis. She has robust reticulocytosis. I recommend continue high-dose folic acid supplementation. The patient will continue to return here every other week with plan for blood t transfusion if her hemoglobin gets close to 8 g.

## 2013-10-21 NOTE — Assessment & Plan Note (Signed)
She has severe joint pain. She requests a prescription for oxycodone. Due to her current pregnancy state, I recommend should avoid taking oxycodone if possible. I gave her prescription for 60 tablets of oxycodone to use as needed until her next visit. I warned her risk of constipation.

## 2013-10-29 ENCOUNTER — Ambulatory Visit (HOSPITAL_COMMUNITY)
Admission: RE | Admit: 2013-10-29 | Discharge: 2013-10-29 | Disposition: A | Discharge status: Home / Self Care | Payer: Medicaid Other | Source: Ambulatory Visit | Attending: Internal Medicine | Admitting: Internal Medicine

## 2013-10-29 ENCOUNTER — Encounter (HOSPITAL_COMMUNITY): Payer: Self-pay

## 2013-10-29 VITALS — BP 123/76 | HR 93 | Wt 142.0 lb

## 2013-10-29 DIAGNOSIS — O09299 Supervision of pregnancy with other poor reproductive or obstetric history, unspecified trimester: Secondary | ICD-10-CM | POA: Insufficient documentation

## 2013-10-29 DIAGNOSIS — D571 Sickle-cell disease 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: Secondary | ICD-10-CM | POA: Insufficient documentation

## 2013-10-29 DIAGNOSIS — Z8751 Personal history of pre-term labor: Secondary | ICD-10-CM

## 2013-10-29 DIAGNOSIS — O34219 Maternal care for unspecified type scar from previous cesarean delivery: Secondary | ICD-10-CM

## 2013-10-29 DIAGNOSIS — O99019 Anemia complicating pregnancy, unspecified trimester: Secondary | ICD-10-CM | POA: Insufficient documentation

## 2013-10-29 DIAGNOSIS — O09529 Supervision of elderly multigravida, unspecified trimester: Secondary | ICD-10-CM | POA: Insufficient documentation

## 2013-11-04 ENCOUNTER — Telehealth: Payer: Self-pay | Admitting: *Deleted

## 2013-11-04 ENCOUNTER — Other Ambulatory Visit: Payer: Self-pay | Admitting: Hematology and Oncology

## 2013-11-04 ENCOUNTER — Other Ambulatory Visit (HOSPITAL_BASED_OUTPATIENT_CLINIC_OR_DEPARTMENT_OTHER): Payer: Medicaid Other

## 2013-11-04 ENCOUNTER — Other Ambulatory Visit: Payer: Self-pay | Admitting: *Deleted

## 2013-11-04 ENCOUNTER — Ambulatory Visit (HOSPITAL_COMMUNITY)
Admission: RE | Admit: 2013-11-04 | Discharge: 2013-11-04 | Disposition: A | Discharge status: Home / Self Care | Payer: Medicaid Other | Source: Ambulatory Visit | Attending: Hematology and Oncology | Admitting: Hematology and Oncology

## 2013-11-04 DIAGNOSIS — D638 Anemia in other chronic diseases classified elsewhere: Secondary | ICD-10-CM | POA: Insufficient documentation

## 2013-11-04 DIAGNOSIS — Z3490 Encounter for supervision of normal pregnancy, unspecified, unspecified trimester: Secondary | ICD-10-CM

## 2013-11-04 DIAGNOSIS — O99019 Anemia complicating pregnancy, unspecified trimester: Secondary | ICD-10-CM | POA: Diagnosis not present

## 2013-11-04 DIAGNOSIS — O99891 Other specified diseases and conditions complicating pregnancy: Secondary | ICD-10-CM

## 2013-11-04 DIAGNOSIS — D571 Sickle-cell disease 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: Secondary | ICD-10-CM

## 2013-11-04 DIAGNOSIS — O9989 Other specified diseases and conditions complicating pregnancy, childbirth and the puerperium: Secondary | ICD-10-CM

## 2013-11-04 LAB — HOLD TUBE, BLOOD BANK

## 2013-11-04 LAB — CBC & DIFF AND RETIC
BASO%: 0.4 % (ref 0.0–2.0)
BASOS ABS: 0.1 10*3/uL (ref 0.0–0.1)
EOS%: 1.6 % (ref 0.0–7.0)
Eosinophils Absolute: 0.3 10*3/uL (ref 0.0–0.5)
HEMATOCRIT: 23.3 % — AB (ref 34.8–46.6)
HEMOGLOBIN: 8.2 g/dL — AB (ref 11.6–15.9)
LYMPH%: 13.9 % — ABNORMAL LOW (ref 14.0–49.7)
MCH: 31.5 pg (ref 25.1–34.0)
MCHC: 35.2 g/dL (ref 31.5–36.0)
MCV: 89.5 fL (ref 79.5–101.0)
MONO#: 2 10*3/uL — ABNORMAL HIGH (ref 0.1–0.9)
MONO%: 12.1 % (ref 0.0–14.0)
NEUT%: 72 % (ref 38.4–76.8)
NEUTROS ABS: 12 10*3/uL — AB (ref 1.5–6.5)
PLATELETS: 322 10*3/uL (ref 145–400)
RBC: 2.61 10*6/uL — ABNORMAL LOW (ref 3.70–5.45)
RDW: 25.9 % — ABNORMAL HIGH (ref 11.2–14.5)
WBC: 16.7 10*3/uL — ABNORMAL HIGH (ref 3.9–10.3)
lymph#: 2.3 10*3/uL (ref 0.9–3.3)

## 2013-11-04 LAB — RETICULOCYTES (CHCC)
RBC.: 2.63 MIL/uL — ABNORMAL LOW (ref 3.87–5.11)
Retic Ct Pct: >20 % — ABNORMAL HIGH (ref 0.4–2.3)

## 2013-11-04 LAB — TECHNOLOGIST REVIEW

## 2013-11-04 NOTE — Telephone Encounter (Signed)
Per POF I have scheduled appt and called the patient

## 2013-11-04 NOTE — Telephone Encounter (Signed)
Called pt with no answer.  Left message on voice mail requesting a call back from pt.

## 2013-11-04 NOTE — Telephone Encounter (Signed)
Pt returned nurse's call.  Asked how pt was feeling.  Pt stated she was feeling dizzy, tired, legs pain, back pain.  Denied short of breath.  Pt stated she would benefit from blood transfusion.  Informed pt that pt will be contacted with further instructions from md. Dr. Alvy Bimler notified.  Order received for pt to receive 2 units of blood on 11/05/13. POF sent to [facility], scheduler.

## 2013-11-05 ENCOUNTER — Ambulatory Visit (HOSPITAL_BASED_OUTPATIENT_CLINIC_OR_DEPARTMENT_OTHER): Payer: Medicaid Other

## 2013-11-05 ENCOUNTER — Other Ambulatory Visit: Payer: Self-pay | Admitting: *Deleted

## 2013-11-05 VITALS — BP 115/64 | HR 78 | Temp 97.4°F | Resp 16

## 2013-11-05 DIAGNOSIS — D649 Anemia, unspecified: Secondary | ICD-10-CM

## 2013-11-05 DIAGNOSIS — D638 Anemia in other chronic diseases classified elsewhere: Secondary | ICD-10-CM

## 2013-11-05 DIAGNOSIS — O99019 Anemia complicating pregnancy, unspecified trimester: Secondary | ICD-10-CM | POA: Diagnosis not present

## 2013-11-05 DIAGNOSIS — O99891 Other specified diseases and conditions complicating pregnancy: Secondary | ICD-10-CM

## 2013-11-05 DIAGNOSIS — O9989 Other specified diseases and conditions complicating pregnancy, childbirth and the puerperium: Secondary | ICD-10-CM

## 2013-11-05 LAB — PREPARE RBC (CROSSMATCH)

## 2013-11-05 MED ORDER — SODIUM CHLORIDE 0.9 % IV SOLN
250.0000 mL | Freq: Once | INTRAVENOUS | Status: AC
  Administered 2013-11-05: 250 mL via INTRAVENOUS

## 2013-11-05 MED ORDER — DIPHENHYDRAMINE HCL 25 MG PO CAPS
25.0000 mg | ORAL_CAPSULE | Freq: Once | ORAL | Status: AC
  Administered 2013-11-05: 25 mg via ORAL

## 2013-11-05 MED ORDER — DIPHENHYDRAMINE HCL 25 MG PO CAPS
ORAL_CAPSULE | ORAL | Status: AC
  Filled 2013-11-05: qty 1

## 2013-11-05 MED ORDER — ACETAMINOPHEN 325 MG PO TABS
ORAL_TABLET | ORAL | Status: AC
  Filled 2013-11-05: qty 2

## 2013-11-05 MED ORDER — ACETAMINOPHEN 325 MG PO TABS: 650.0000 mg | ORAL_TABLET | Freq: Once | ORAL | Status: DC

## 2013-11-05 NOTE — Patient Instructions (Signed)
Blood Transfusion  A blood transfusion replaces your blood or some of its parts. Blood is replaced when you have lost blood because of surgery, an accident, or for severe blood conditions like anemia. You can donate blood to be used on yourself if you have a planned surgery. If you lose blood during that surgery, your own blood can be given back to you. Any blood given to you is checked to make sure it matches your blood type. Your temperature, blood pressure, and heart rate (vital signs) will be checked often.  GET HELP RIGHT AWAY IF:   You feel sick to your stomach (nauseous) or throw up (vomit).  You have watery poop (diarrhea).  You have shortness of breath or trouble breathing.  You have blood in your pee (urine) or have dark colored pee.  You have chest pain or tightness.  Your eyes or skin turn yellow (jaundice).  You have a temperature by mouth above 102 F (38.9 C), not controlled by medicine.  You start to shake and have chills.  You develop a a red rash (hives) or feel itchy.  You develop lightheadedness or feel confused.  You develop back, joint, or muscle pain.  You do not feel hungry (lost appetite).  You feel tired, restless, or nervous.  You develop belly (abdominal) cramps. Document Released: 08/02/2008 Document Revised: 07/29/2011 Document Reviewed: 08/02/2008 Exit[employer] Patient Information 2015 [employer]. This information is not intended to replace advice given to you by your health care provider. Make sure you discuss any questions you have with your health care provider.  

## 2013-11-06 LAB — TYPE AND SCREEN
ABO/RH(D): B POS
ANTIBODY SCREEN: NEGATIVE
UNIT DIVISION: 0
UNIT DIVISION: 0
Unit division: 0

## 2013-11-11 ENCOUNTER — Encounter: Payer: Self-pay | Admitting: Obstetrics and Gynecology

## 2013-11-11 ENCOUNTER — Ambulatory Visit (INDEPENDENT_AMBULATORY_CARE_PROVIDER_SITE_OTHER): Payer: Medicaid Other | Admitting: Family

## 2013-11-11 VITALS — BP 126/77 | HR 78 | Wt 142.0 lb

## 2013-11-11 DIAGNOSIS — O0990 Supervision of high risk pregnancy, unspecified, unspecified trimester: Secondary | ICD-10-CM

## 2013-11-11 DIAGNOSIS — O0993 Supervision of high risk pregnancy, unspecified, third trimester: Secondary | ICD-10-CM

## 2013-11-11 LAB — POCT URINALYSIS DIP (DEVICE)
BILIRUBIN URINE: NEGATIVE
GLUCOSE, UA: NEGATIVE mg/dL
HGB URINE DIPSTICK: NEGATIVE
KETONES UR: NEGATIVE mg/dL
Leukocytes, UA: NEGATIVE
Nitrite: NEGATIVE
Protein, ur: NEGATIVE mg/dL
SPECIFIC GRAVITY, URINE: 1.01 (ref 1.005–1.030)
Urobilinogen, UA: 1 mg/dL (ref 0.0–1.0)
pH: 7 (ref 5.0–8.0)

## 2013-11-11 LAB — CBC
HCT: 26.4 % — ABNORMAL LOW (ref 36.0–46.0)
Hemoglobin: 9.1 g/dL — ABNORMAL LOW (ref 12.0–15.0)
MCH: 29.6 pg (ref 26.0–34.0)
MCHC: 34.5 g/dL (ref 30.0–36.0)
MCV: 86 fL (ref 78.0–100.0)
PLATELETS: 325 10*3/uL (ref 150–400)
RBC: 3.07 MIL/uL — ABNORMAL LOW (ref 3.87–5.11)
RDW: 22.3 % — ABNORMAL HIGH (ref 11.5–15.5)
WBC: 15.5 10*3/uL — ABNORMAL HIGH (ref 4.0–10.5)

## 2013-11-11 LAB — HIV ANTIBODY (ROUTINE TESTING W REFLEX): HIV 1&2 Ab, 4th Generation: NONREACTIVE

## 2013-11-11 LAB — RPR

## 2013-11-11 MED ORDER — TETANUS-DIPHTH-ACELL PERTUSSIS 5-2.5-18.5 LF-MCG/0.5 IM SUSP: 0.5000 mL | Freq: Once | INTRAMUSCULAR | Status: DC

## 2013-11-11 NOTE — Progress Notes (Signed)
Continues to see Dr. Zigmund Daniel for maintenance of sickle cell > received blood transfusion on 11/05/13.  Next ultrasound scheduled for 11/26/13.

## 2013-11-12 LAB — GLUCOSE TOLERANCE, 1 HOUR (50G) W/O FASTING: Glucose, 1 Hour GTT: 93 mg/dL (ref 70–140)

## 2013-11-14 ENCOUNTER — Encounter: Payer: Self-pay | Admitting: Family

## 2013-11-18 ENCOUNTER — Other Ambulatory Visit (HOSPITAL_BASED_OUTPATIENT_CLINIC_OR_DEPARTMENT_OTHER): Payer: Medicaid Other

## 2013-11-18 ENCOUNTER — Telehealth: Payer: Self-pay | Admitting: *Deleted

## 2013-11-18 DIAGNOSIS — Z3490 Encounter for supervision of normal pregnancy, unspecified, unspecified trimester: Secondary | ICD-10-CM

## 2013-11-18 DIAGNOSIS — D571 Sickle-cell disease 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: Secondary | ICD-10-CM

## 2013-11-18 LAB — CBC & DIFF AND RETIC
BASO%: 0.9 % (ref 0.0–2.0)
Basophils Absolute: 0.1 10*3/uL (ref 0.0–0.1)
EOS ABS: 0.2 10*3/uL (ref 0.0–0.5)
EOS%: 1.4 % (ref 0.0–7.0)
HEMATOCRIT: 26 % — AB (ref 34.8–46.6)
HGB: 9.1 g/dL — ABNORMAL LOW (ref 11.6–15.9)
LYMPH#: 2 10*3/uL (ref 0.9–3.3)
LYMPH%: 12.9 % — AB (ref 14.0–49.7)
MCH: 31.4 pg (ref 25.1–34.0)
MCHC: 35 g/dL (ref 31.5–36.0)
MCV: 89.8 fL (ref 79.5–101.0)
MONO#: 1.8 10*3/uL — AB (ref 0.1–0.9)
MONO%: 11.9 % (ref 0.0–14.0)
NEUT%: 72.9 % (ref 38.4–76.8)
NEUTROS ABS: 11 10*3/uL — AB (ref 1.5–6.5)
NRBC: 45 % — AB (ref 0–0)
PLATELETS: 378 10*3/uL (ref 145–400)
RBC: 2.9 10*6/uL — AB (ref 3.70–5.45)
RDW: 23 % — ABNORMAL HIGH (ref 11.2–14.5)
WBC: 15.1 10*3/uL — ABNORMAL HIGH (ref 3.9–10.3)

## 2013-11-18 LAB — TECHNOLOGIST REVIEW

## 2013-11-18 LAB — RETICULOCYTES (CHCC)
ABS Retic: 540.5 10*3/uL — ABNORMAL HIGH (ref 19.0–186.0)
RBC.: 2.97 MIL/uL — AB (ref 3.87–5.11)
Retic Ct Pct: 18.2 % — ABNORMAL HIGH (ref 0.4–2.3)

## 2013-11-18 LAB — HOLD TUBE, BLOOD BANK

## 2013-11-18 NOTE — Telephone Encounter (Signed)
Informed pt no need for transfusion today.  Hgb 9.1.  Keep appt as scheduled in 2 weeks.  She verbalized understanding.

## 2013-11-25 ENCOUNTER — Ambulatory Visit (INDEPENDENT_AMBULATORY_CARE_PROVIDER_SITE_OTHER): Payer: Medicaid Other | Admitting: Obstetrics & Gynecology

## 2013-11-25 VITALS — BP 126/70 | HR 87 | Wt 145.0 lb

## 2013-11-25 DIAGNOSIS — O09299 Supervision of pregnancy with other poor reproductive or obstetric history, unspecified trimester: Secondary | ICD-10-CM

## 2013-11-25 DIAGNOSIS — Z23 Encounter for immunization: Secondary | ICD-10-CM

## 2013-11-25 DIAGNOSIS — O26839 Pregnancy related renal disease, unspecified trimester: Secondary | ICD-10-CM

## 2013-11-25 DIAGNOSIS — O1213 Gestational proteinuria, third trimester: Secondary | ICD-10-CM

## 2013-11-25 DIAGNOSIS — R809 Proteinuria, unspecified: Secondary | ICD-10-CM

## 2013-11-25 DIAGNOSIS — O09293 Supervision of pregnancy with other poor reproductive or obstetric history, third trimester: Secondary | ICD-10-CM

## 2013-11-25 LAB — POCT URINALYSIS DIP (DEVICE)
Bilirubin Urine: NEGATIVE
Glucose, UA: NEGATIVE mg/dL
KETONES UR: NEGATIVE mg/dL
Leukocytes, UA: NEGATIVE
NITRITE: NEGATIVE
PH: 7 (ref 5.0–8.0)
PROTEIN: 100 mg/dL — AB
Specific Gravity, Urine: 1.015 (ref 1.005–1.030)
Urobilinogen, UA: 0.2 mg/dL (ref 0.0–1.0)

## 2013-11-25 MED ORDER — RANITIDINE HCL 150 MG PO TABS: 150.0000 mg | ORAL_TABLET | Freq: Two times a day (BID) | ORAL | Status: DC

## 2013-11-25 MED ORDER — CALCIUM CARBONATE ANTACID 500 MG PO CHEW: 2.0000 | CHEWABLE_TABLET | Freq: Two times a day (BID) | ORAL | Status: DC

## 2013-11-25 NOTE — Progress Notes (Signed)
[place] for growth tomorrow.  Having reflux symptoms.  Will try Zantac bid for a week.  If no improvement, then will move to PPI.  Tums prn

## 2013-11-25 NOTE — Progress Notes (Signed)
Patient reports back pain

## 2013-11-26 ENCOUNTER — Encounter (HOSPITAL_COMMUNITY): Payer: Self-pay

## 2013-11-26 ENCOUNTER — Ambulatory Visit (HOSPITAL_COMMUNITY)
Admission: RE | Admit: 2013-11-26 | Discharge: 2013-11-26 | Disposition: A | Discharge status: Home / Self Care | Payer: Medicaid Other | Source: Ambulatory Visit | Attending: Obstetrics & Gynecology | Admitting: Obstetrics & Gynecology

## 2013-11-26 DIAGNOSIS — Z3689 Encounter for other specified antenatal screening: Secondary | ICD-10-CM | POA: Diagnosis not present

## 2013-11-26 DIAGNOSIS — O09299 Supervision of pregnancy with other poor reproductive or obstetric history, unspecified trimester: Secondary | ICD-10-CM | POA: Diagnosis not present

## 2013-11-27 LAB — CULTURE, OB URINE
COLONY COUNT: NO GROWTH
Organism ID, Bacteria: NO GROWTH

## 2013-11-30 ENCOUNTER — Other Ambulatory Visit: Payer: Self-pay | Admitting: Family Medicine

## 2013-11-30 DIAGNOSIS — Z8751 Personal history of pre-term labor: Secondary | ICD-10-CM

## 2013-11-30 DIAGNOSIS — O09299 Supervision of pregnancy with other poor reproductive or obstetric history, unspecified trimester: Secondary | ICD-10-CM

## 2013-11-30 DIAGNOSIS — O09529 Supervision of elderly multigravida, unspecified trimester: Secondary | ICD-10-CM

## 2013-11-30 DIAGNOSIS — D571 Sickle-cell disease 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: Secondary | ICD-10-CM

## 2013-11-30 DIAGNOSIS — O34219 Maternal care for unspecified type scar from previous cesarean delivery: Secondary | ICD-10-CM

## 2013-12-02 ENCOUNTER — Ambulatory Visit (HOSPITAL_BASED_OUTPATIENT_CLINIC_OR_DEPARTMENT_OTHER): Payer: Medicaid Other | Admitting: Hematology and Oncology

## 2013-12-02 ENCOUNTER — Encounter: Payer: Self-pay | Admitting: Hematology and Oncology

## 2013-12-02 ENCOUNTER — Telehealth: Payer: Self-pay | Admitting: Hematology and Oncology

## 2013-12-02 ENCOUNTER — Other Ambulatory Visit (HOSPITAL_BASED_OUTPATIENT_CLINIC_OR_DEPARTMENT_OTHER): Payer: Medicaid Other

## 2013-12-02 VITALS — BP 135/78 | HR 80 | Temp 98.0°F | Resp 18 | Ht 67.0 in | Wt 147.1 lb

## 2013-12-02 DIAGNOSIS — D649 Anemia, unspecified: Secondary | ICD-10-CM

## 2013-12-02 DIAGNOSIS — O99013 Anemia complicating pregnancy, third trimester: Secondary | ICD-10-CM

## 2013-12-02 DIAGNOSIS — Z3490 Encounter for supervision of normal pregnancy, unspecified, unspecified trimester: Secondary | ICD-10-CM

## 2013-12-02 DIAGNOSIS — O99019 Anemia complicating pregnancy, unspecified trimester: Secondary | ICD-10-CM | POA: Insufficient documentation

## 2013-12-02 DIAGNOSIS — O9989 Other specified diseases and conditions complicating pregnancy, childbirth and the puerperium: Secondary | ICD-10-CM

## 2013-12-02 DIAGNOSIS — O09299 Supervision of pregnancy with other poor reproductive or obstetric history, unspecified trimester: Secondary | ICD-10-CM

## 2013-12-02 DIAGNOSIS — O99891 Other specified diseases and conditions complicating pregnancy: Secondary | ICD-10-CM

## 2013-12-02 DIAGNOSIS — D571 Sickle-cell disease 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: Secondary | ICD-10-CM

## 2013-12-02 LAB — MANUAL DIFFERENTIAL
ALC: 1.8 10*3/uL (ref 0.9–3.3)
ANC (CHCC manual diff): 12.7 10*3/uL — ABNORMAL HIGH (ref 1.5–6.5)
Band Neutrophils: 0 % (ref 0–10)
Basophil: 0 % (ref 0–2)
Blasts: 0 % (ref 0–0)
EOS: 2 % (ref 0–7)
LYMPH: 11 % — ABNORMAL LOW (ref 14–49)
MONO: 10 % (ref 0–14)
MYELOCYTES: 3 % — AB (ref 0–0)
Metamyelocytes: 0 % (ref 0–0)
NRBC: 107 % — AB (ref 0–0)
OTHER CELL: 0 % (ref 0–0)
PLT EST: ADEQUATE
PROMYELO: 0 % (ref 0–0)
SEG: 74 % (ref 38–77)
VARIANT LYMPH: 0 % (ref 0–0)

## 2013-12-02 LAB — CBC & DIFF AND RETIC
HCT: 24.3 % — ABNORMAL LOW (ref 34.8–46.6)
HEMOGLOBIN: 8.7 g/dL — AB (ref 11.6–15.9)
MCH: 31.4 pg (ref 25.1–34.0)
MCHC: 35.7 g/dL (ref 31.5–36.0)
MCV: 87.9 fL (ref 79.5–101.0)
PLATELETS: 311 10*3/uL (ref 145–400)
RBC: 2.76 10*6/uL — ABNORMAL LOW (ref 3.70–5.45)
RDW: 24.5 % — AB (ref 11.2–14.5)
WBC: 16.5 10*3/uL — ABNORMAL HIGH (ref 3.9–10.3)

## 2013-12-02 LAB — RETICULOCYTES (CHCC)
ABS Retic: 545.7 10*3/uL — ABNORMAL HIGH (ref 19.0–186.0)
RBC.: 2.77 MIL/uL — AB (ref 3.87–5.11)
RETIC CT PCT: 19.7 % — AB (ref 0.4–2.3)

## 2013-12-02 LAB — HOLD TUBE, BLOOD BANK

## 2013-12-02 NOTE — Assessment & Plan Note (Signed)
This patient has no recent acute crisis. She has robust reticulocytosis. I recommend continue high-dose folic acid supplementation. The patient will continue to return here every other week with plan for blood transfusion if her hemoglobin gets close to 8 g. Per guideline, I was required to arrange for blood transfusions at the [facility] so that we can benefit from fetal monitoring. I discussed this with the patient and she agreed.

## 2013-12-02 NOTE — Progress Notes (Signed)
[facility] OFFICE PROGRESS NOTE  [name],[name], MD SUMMARY OF HEMATOLOGIC HISTORY: [name] 43 y.o. 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 According to the patient, she is pregnant again, with a due date expected on 02/02/2014. Previously, when she was pregnant with her daughter, she had severe hypertension, and intrauterine growth retardation of her daughter and her baby was delivered with C-section at 78 weeks. Her daughter is currently healthy. With her sickle cell disease, she had recurrent admission to the hospital 2-3 times a year for painful chest crisis. She was also known to have avascular necrosis of hips but never required any joint surgery. She never developed history of stroke. She had numerous transfusions in the past without complications. The patient does not take hydroxyurea when she was not pregnant. She was found to have abnormal CBC from her routine blood work recently, the last time she had exchange transfusion a few months ago for anemia of 7.9 g INTERVAL HISTORY: [name] 43 y.o. female returns for further followup. She states that her pregnancy is progressing well. She has occasional dizziness and headaches. She also complained of reflux and was started on Zantac. She is concerned that she was told her baby is a little small.   I have reviewed the past medical history, past surgical history, social history and family history with the patient and they are unchanged from previous note.  ALLERGIES:  has No Known Allergies.  MEDICATIONS:  Current Outpatient Prescriptions  Medication Sig Dispense Refill  . acetaminophen (TYLENOL) 325 MG tablet Take 650 mg by mouth every 6 (six) hours as needed.      . calcium carbonate (TUMS) 500 MG chewable tablet Chew 2 tablets (400 mg of elemental calcium total) by mouth 2 (two)  times daily.  701 tablet  3  . folic acid (FOLVITE) 1 MG tablet Take 5 tablets (5 mg total) by mouth daily. Please dispense higher strengths if available  300 tablet  3  . oxyCODONE (OXY IR/ROXICODONE) 5 MG immediate release tablet Take 1 tablet (5 mg total) by mouth every 4 (four) hours as needed for severe pain.  60 tablet  0  . Prenatal Vit-Fe Fumarate-FA (PRENATAL PO) Take 1 tablet by mouth daily.      . ranitidine (ZANTAC) 150 MG tablet Take 1 tablet (150 mg total) by mouth 2 (two) times daily.  60 tablet  3   Current Facility-Administered Medications  Medication Dose Route Frequency Provider Last Rate Last Dose  . Tdap (BOOSTRIX) injection 0.5 mL  0.5 mL Intramuscular Once [place], CNM         REVIEW OF SYSTEMS:   Constitutional: Denies fevers, chills or night sweats Eyes: Denies blurriness of vision Ears, nose, mouth, throat, and face: Denies mucositis or sore throat Respiratory: Denies cough, dyspnea or wheezes Cardiovascular: Denies palpitation, chest discomfort or lower extremity swelling Skin: Denies abnormal skin rashes Lymphatics: Denies new lymphadenopathy or easy bruising Neurological:Denies numbness, tingling or new weaknesses Behavioral/Psych: Mood is stable, no new changes  All other systems were reviewed with the patient and are negative.  PHYSICAL EXAMINATION: ECOG PERFORMANCE STATUS: 1 - Symptomatic but completely ambulatory  Filed Vitals:   12/02/13 1042  BP: 135/78  Pulse: 80  Temp: 98 F (36.7 C)  Resp: 18   Filed Weights   12/02/13 1042  Weight: 147 lb 1.6 oz (  66.724 kg)    GENERAL:alert, no distress and comfortable SKIN: skin color, texture, turgor are normal, no rashes or significant lesions EYES: normal, Conjunctiva are pale and non-injected, sclera clear OROPHARYNX:no exudate, no erythema and lips, buccal mucosa, and tongue normal  NECK: supple, thyroid normal size, non-tender, without nodularity LYMPH:  no palpable lymphadenopathy  in the cervical, axillary or inguinal LUNGS: clear to auscultation and percussion with normal breathing effort HEART: regular rate & rhythm with soft systolic murmurs and no lower extremity edema ABDOMEN:abdomen soft, non-tender and normal bowel sounds Musculoskeletal:no cyanosis of digits and no clubbing  NEURO: alert & oriented x 3 with fluent speech, no focal motor/sensory deficits  LABORATORY DATA:  I have reviewed the data as listed Results for orders placed in visit on 12/02/13 (from the past 48 hour(s))  CBC & DIFF AND RETIC     Status: Abnormal   Collection Time    12/02/13  9:42 AM      Result Value Ref Range   WBC 16.5 (*) 3.9 - 10.3 10e3/uL   HGB 8.7 (*) 11.6 - 15.9 g/dL   HCT 24.3 (*) 34.8 - 46.6 %   Platelets 311  145 - 400 10e3/uL   MCV 87.9  79.5 - 101.0 fL   MCH 31.4  25.1 - 34.0 pg   MCHC 35.7  31.5 - 36.0 g/dL   RBC 2.76 (*) 3.70 - 5.45 10e6/uL   RDW 24.5 (*) 11.2 - 14.5 %   Retic Comment Sent out for confirmation.     Comment: Note New Reference Ranges.  MANUAL DIFFERENTIAL     Status: Abnormal   Collection Time    12/02/13  9:42 AM      Result Value Ref Range   ANC ([facility] manual diff) 12.7 (*) 1.5 - 6.5 10e3/uL   ALC 1.8  0.9 - 3.3 10e3/uL   SEG 74  38 - 77 %   Band Neutrophils 0  0 - 10 %   LYMPH 11 (*) 14 - 49 %   MONO 10  0 - 14 %   EOS 2  0 - 7 %   Basophil 0  0 - 2 %   Metamyelocytes 0  0 - 0 %   Myelocytes 3 (*) 0 - 0 %   PROMYELO 0  0 - 0 %   Blasts 0  0 - 0 %   Variant Lymph 0  0 - 0 %   Other Cell 0  0 - 0 %   nRBC 107 (*) 0 - 0 %   Polychromasia Marked  Slight   Target Cells Few  Negative   Sickle Cells Many  Negative   PLT EST Adequate  Adequate   Platelet Morphology Large Platelets  Within Normal Limits    Lab Results  Component Value Date   WBC 16.5* 12/02/2013   HGB 8.7* 12/02/2013   HCT 24.3* 12/02/2013   MCV 87.9 12/02/2013   PLT 311 12/02/2013   ASSESSMENT & PLAN:  Anemia complicating pregnancy This patient has no recent acute  crisis. She has robust reticulocytosis. I recommend continue high-dose folic acid supplementation. The patient will continue to return here every other week with plan for blood transfusion if her hemoglobin gets close to 8 g. Per guideline, I was required to arrange for blood transfusions at the [facility] so that we can benefit from fetal monitoring. I discussed this with the patient and she agreed.    High risk pregnancy due to  history of previous obstetrical problem Overall, her pregnancy is stable. I plan to continue transfusion support as needed. I would not recommend exchange transfusion right now.      All questions were answered. The patient knows to call the clinic with any problems, questions or concerns. No barriers to learning was detected.  I spent 15 minutes counseling the patient face to face. The total time spent in the appointment was 20 minutes and more than 50% was on counseling.     [place], [place], MD 12/02/2013 10:56 AM

## 2013-12-02 NOTE — Assessment & Plan Note (Signed)
Overall, her pregnancy is stable. I plan to continue transfusion support as needed. I would not recommend exchange transfusion right now.

## 2013-12-02 NOTE — Telephone Encounter (Signed)
gv pt appt schedule for july/aug °

## 2013-12-09 ENCOUNTER — Ambulatory Visit (INDEPENDENT_AMBULATORY_CARE_PROVIDER_SITE_OTHER): Payer: Medicaid Other | Admitting: Obstetrics & Gynecology

## 2013-12-09 VITALS — BP 126/68 | HR 72 | Temp 98.8°F | Wt 143.3 lb

## 2013-12-09 DIAGNOSIS — O09293 Supervision of pregnancy with other poor reproductive or obstetric history, third trimester: Secondary | ICD-10-CM

## 2013-12-09 DIAGNOSIS — O09299 Supervision of pregnancy with other poor reproductive or obstetric history, unspecified trimester: Secondary | ICD-10-CM

## 2013-12-09 LAB — POCT URINALYSIS DIP (DEVICE)
GLUCOSE, UA: NEGATIVE mg/dL
KETONES UR: NEGATIVE mg/dL
LEUKOCYTES UA: NEGATIVE
Nitrite: NEGATIVE
Protein, ur: >=300 mg/dL — AB
Specific Gravity, Urine: 1.01 (ref 1.005–1.030)
Urobilinogen, UA: 1 mg/dL (ref 0.0–1.0)
pH: 6.5 (ref 5.0–8.0)

## 2013-12-09 NOTE — Progress Notes (Signed)
No concerning symptoms.  Growth scan scheduled next week (12/16/13) to follow up EFW 23% noted on 11/26/13. Will start antenatal testing next visit. Followed by Dr. Alvy Bimler (Hematology), has upcoming appointments. No other complaints or concerns.  Fetal movement and labor precautions reviewed.

## 2013-12-09 NOTE — Patient Instructions (Signed)
Return to clinic for any obstetric concerns or go to [facility] for evaluation  

## 2013-12-14 ENCOUNTER — Ambulatory Visit (INDEPENDENT_AMBULATORY_CARE_PROVIDER_SITE_OTHER): Payer: Medicaid Other | Admitting: *Deleted

## 2013-12-14 VITALS — BP 119/67 | HR 79

## 2013-12-14 DIAGNOSIS — O09523 Supervision of elderly multigravida, third trimester: Secondary | ICD-10-CM

## 2013-12-14 DIAGNOSIS — O09529 Supervision of elderly multigravida, unspecified trimester: Secondary | ICD-10-CM

## 2013-12-16 ENCOUNTER — Other Ambulatory Visit: Payer: Self-pay | Admitting: Family Medicine

## 2013-12-16 ENCOUNTER — Other Ambulatory Visit: Payer: Self-pay | Admitting: Hematology and Oncology

## 2013-12-16 ENCOUNTER — Telehealth: Payer: Self-pay | Admitting: *Deleted

## 2013-12-16 ENCOUNTER — Encounter (HOSPITAL_COMMUNITY): Payer: Self-pay

## 2013-12-16 ENCOUNTER — Ambulatory Visit (HOSPITAL_COMMUNITY)
Admission: RE | Admit: 2013-12-16 | Discharge: 2013-12-16 | Disposition: A | Discharge status: Home / Self Care | Payer: Medicaid Other | Source: Ambulatory Visit | Attending: Internal Medicine | Admitting: Internal Medicine

## 2013-12-16 ENCOUNTER — Ambulatory Visit (INDEPENDENT_AMBULATORY_CARE_PROVIDER_SITE_OTHER): Payer: Medicaid Other | Admitting: Obstetrics & Gynecology

## 2013-12-16 ENCOUNTER — Other Ambulatory Visit (HOSPITAL_BASED_OUTPATIENT_CLINIC_OR_DEPARTMENT_OTHER): Payer: Medicaid Other

## 2013-12-16 VITALS — BP 126/73 | HR 64 | Wt 146.4 lb

## 2013-12-16 DIAGNOSIS — O34219 Maternal care for unspecified type scar from previous cesarean delivery: Secondary | ICD-10-CM

## 2013-12-16 DIAGNOSIS — O09299 Supervision of pregnancy with other poor reproductive or obstetric history, unspecified trimester: Secondary | ICD-10-CM

## 2013-12-16 DIAGNOSIS — R82998 Other abnormal findings in urine: Secondary | ICD-10-CM

## 2013-12-16 DIAGNOSIS — Z8701 Personal history of pneumonia (recurrent): Secondary | ICD-10-CM | POA: Diagnosis not present

## 2013-12-16 DIAGNOSIS — O09529 Supervision of elderly multigravida, unspecified trimester: Secondary | ICD-10-CM

## 2013-12-16 DIAGNOSIS — D638 Anemia in other chronic diseases classified elsewhere: Secondary | ICD-10-CM

## 2013-12-16 DIAGNOSIS — O99019 Anemia complicating pregnancy, unspecified trimester: Secondary | ICD-10-CM

## 2013-12-16 DIAGNOSIS — R8290 Unspecified abnormal findings in urine: Secondary | ICD-10-CM

## 2013-12-16 DIAGNOSIS — Z3490 Encounter for supervision of normal pregnancy, unspecified, unspecified trimester: Secondary | ICD-10-CM

## 2013-12-16 DIAGNOSIS — Z79899 Other long term (current) drug therapy: Secondary | ICD-10-CM | POA: Diagnosis not present

## 2013-12-16 DIAGNOSIS — D571 Sickle-cell disease 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: Secondary | ICD-10-CM

## 2013-12-16 DIAGNOSIS — O99013 Anemia complicating pregnancy, third trimester: Principal | ICD-10-CM

## 2013-12-16 DIAGNOSIS — G894 Chronic pain syndrome: Secondary | ICD-10-CM

## 2013-12-16 DIAGNOSIS — Z8751 Personal history of pre-term labor: Secondary | ICD-10-CM | POA: Insufficient documentation

## 2013-12-16 DIAGNOSIS — O09293 Supervision of pregnancy with other poor reproductive or obstetric history, third trimester: Secondary | ICD-10-CM

## 2013-12-16 DIAGNOSIS — D649 Anemia, unspecified: Secondary | ICD-10-CM

## 2013-12-16 LAB — MANUAL DIFFERENTIAL
ALC: 2.2 10*3/uL (ref 0.9–3.3)
ANC (CHCC MAN DIFF): 8.6 10*3/uL — AB (ref 1.5–6.5)
BLASTS: 0 % (ref 0–0)
Band Neutrophils: 0 % (ref 0–10)
Basophil: 0 % (ref 0–2)
EOS: 2 % (ref 0–7)
LYMPH: 18 % (ref 14–49)
METAMYELOCYTES PCT: 0 % (ref 0–0)
MONO: 9 % (ref 0–14)
MYELOCYTES: 0 % (ref 0–0)
Other Cell: 0 % (ref 0–0)
PLT EST: ADEQUATE
PROMYELO: 0 % (ref 0–0)
SEG: 71 % (ref 38–77)
Variant Lymph: 0 % (ref 0–0)
nRBC: 143 % — ABNORMAL HIGH (ref 0–0)

## 2013-12-16 LAB — POCT URINALYSIS DIP (DEVICE)
BILIRUBIN URINE: NEGATIVE
Glucose, UA: NEGATIVE mg/dL
KETONES UR: NEGATIVE mg/dL
Leukocytes, UA: NEGATIVE
Nitrite: NEGATIVE
Protein, ur: >=300 mg/dL — AB
Specific Gravity, Urine: 1.015 (ref 1.005–1.030)
Urobilinogen, UA: 1 mg/dL (ref 0.0–1.0)
pH: 6 (ref 5.0–8.0)

## 2013-12-16 LAB — CBC & DIFF AND RETIC
HCT: 21.3 % — ABNORMAL LOW (ref 34.8–46.6)
HGB: 7.7 g/dL — ABNORMAL LOW (ref 11.6–15.9)
MCH: 30.2 pg (ref 25.1–34.0)
MCHC: 36.2 g/dL — ABNORMAL HIGH (ref 31.5–36.0)
MCV: 83.5 fL (ref 79.5–101.0)
Platelets: 305 10*3/uL (ref 145–400)
RBC: 2.55 10*6/uL — AB (ref 3.70–5.45)
RDW: 25.8 % — AB (ref 11.2–14.5)
WBC: 12.1 10*3/uL — AB (ref 3.9–10.3)

## 2013-12-16 LAB — HOLD TUBE, BLOOD BANK

## 2013-12-16 NOTE — Progress Notes (Signed)
Patient reports occasional pelvic pain

## 2013-12-16 NOTE — Progress Notes (Signed)
Patient is still undecided about mode of delivery. Counseled about TOLAC vs RCS, information given to her to review at home. Hopefully, will make decision by next visit.   Patient has >300 protein and moderate Hgb in urine since 11/25/13; negative urine culture in 11/25/13. Will recheck today. No urinary symptoms, normal BP. NST performed today was reviewed and was found to be reactive.  Continue recommended antenatal testing and prenatal care.  Growth ultrasound scheduled for later today. Reassured about occasional pelvic pain; fetal movement and labor precautions reviewed.

## 2013-12-16 NOTE — Patient Instructions (Addendum)
Return to clinic for any obstetric concerns or go to MAU for evaluation  Trial of Labor After Cesarean Delivery Information A trial of labor after cesarean delivery (TOLAC) is when a woman tries to give birth vaginally after a previous cesarean delivery. TOLAC may be a safe and appropriate option for you depending on your medical history and other risk factors. When TOLAC is successful and you are able to have a vaginal delivery, this is called a vaginal birth after cesarean delivery (VBAC).  CANDIDATES FOR TOLAC TOLAC is possible for some women who:  Have undergone one or two prior cesarean deliveries in which the incision of the uterus was horizontal (low transverse).  Are carrying twins and have had one prior low transverse incision during a cesarean delivery.  Do not have a vertical (classical) uterine scar.  Have not had a tear in the wall of their uterus (uterine rupture). TOLAC is also supported for women who meet appropriate criteria and:  Are under the age of 19 years.  Are tall and have a body mass index (BMI) of less than 30.  Have an unknown uterine scar.  Give birth in a facility equipped to handle an emergency cesarean delivery. This team should be able to handle possible complications such as a uterine rupture.  Have thorough counseling about the benefits and risks of TOLAC.  Have discussed future pregnancy plans with their health care provider.  Plan to have several more pregnancies. MOST SUCCESSFUL CANDIDATES FOR TOLAC:  Have had a successful vaginal delivery before or after their cesarean delivery.  Experience labor that begins naturally on or before the due date (40 weeks of gestation).  Do not have a very large (macrosomic) baby.   Had a prior cesarean delivery but are not currently experiencing factors that would prompt a cesarean delivery (such as a breech position).  Had only one prior cesarean delivery.  Had a prior cesarean delivery that was  performed early in labor and not after full cervical dilation. TOLAC may be most appropriate for women who meet the above guidelines and who plan to have more pregnancies. TOLAC is not recommended for home births. LEAST SUCCESSFUL CANDIDATES FOR TOLAC:  Have an induced labor with an unfavorable cervix. An unfavorable cervix is when the cervix is not dilating enough (among other factors).  Have never had a vaginal delivery.  Have had more than two cesarean deliveries.  Have a pregnancy at more than 40 weeks of gestation.  Are pregnant with a baby with a suspected weight greater than 4,000 grams (8 pounds) and who have no prior history of a vaginal delivery.  Have closely spaced pregnancies. SUGGESTED BENEFITS OF TOLAC  You may have a faster recovery time.  You may have a shorter stay in the hospital.  You may have less pain and fewer problems than with a cesarean delivery. Women who have a cesarean delivery have a higher chance of needing blood or getting a fever, an infection, or a blood clot in the legs. SUGGESTED RISKS OF TOLAC The highest risk of complications happens to women who attempt a TOLAC and fail. A failed TOLAC results in an unplanned cesarean delivery. Risks related to [facility] or repeat cesarean deliveries include:   Blood loss.  Infection.  Blood clot.  Injury to surrounding tissues or organs.  Having to remove the uterus (hysterectomy).  Potential problems with the placenta (such as placenta previa or placenta accreta) in future pregnancies. Although very rare, the main concerns with TOLAC are:  Rupture of the uterine scar from a past cesarean delivery.  Needing an emergency cesarean delivery.  Having a bad outcome for the baby (perinatal morbidity). FOR MORE INFORMATION American Congress of Obstetricians and Gynecologists: www.acog.[place]: www.midwife.org Document Released: 01/22/2011 Document Revised: 02/24/2013 Document  Reviewed: 10/26/2012 [facility] Patient Information 2015 [place], [place]. This information is not intended to replace advice given to you by your health care provider. Make sure you discuss any questions you have with your health care provider.

## 2013-12-16 NOTE — Telephone Encounter (Signed)
Dr. Alvy Bimler ordered 2 units of Blood transfused tomorrow for Hgb 7.7.   Per Policy, pregnant women have to be transfused at [facility] [phone] and s/w Arbie Cookey.  She instructed pt should be transfused on the Women[facility] Unit at [phone].  S/w Vonzella Nipple on Women's unit and gave information regarding pt.. She instructed for pt to go to Admissions in the morning around 9 am.   Pt was drawn for type and cross here and our lab is sending her blood to [facility] for the type and cross.  Instructed pt on Hgb 7.7 and to arrive at [place] around 9 am.  She verbalized understanding.

## 2013-12-17 ENCOUNTER — Encounter (HOSPITAL_COMMUNITY): Payer: Self-pay | Admitting: *Deleted

## 2013-12-17 ENCOUNTER — Observation Stay (HOSPITAL_COMMUNITY)
Admission: AD | Admit: 2013-12-17 | Discharge: 2013-12-17 | Disposition: A | Discharge status: Home / Self Care | Payer: Medicaid Other | Source: Ambulatory Visit | Attending: Obstetrics & Gynecology | Admitting: Obstetrics & Gynecology

## 2013-12-17 DIAGNOSIS — Z8701 Personal history of pneumonia (recurrent): Secondary | ICD-10-CM | POA: Insufficient documentation

## 2013-12-17 DIAGNOSIS — D571 Sickle-cell disease 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: Secondary | ICD-10-CM | POA: Diagnosis present

## 2013-12-17 DIAGNOSIS — O99019 Anemia complicating pregnancy, unspecified trimester: Principal | ICD-10-CM | POA: Diagnosis present

## 2013-12-17 DIAGNOSIS — O09529 Supervision of elderly multigravida, unspecified trimester: Secondary | ICD-10-CM | POA: Insufficient documentation

## 2013-12-17 DIAGNOSIS — D638 Anemia in other chronic diseases classified elsewhere: Secondary | ICD-10-CM

## 2013-12-17 DIAGNOSIS — Z79899 Other long term (current) drug therapy: Secondary | ICD-10-CM | POA: Insufficient documentation

## 2013-12-17 LAB — RETICULOCYTES (CHCC)
ABS Retic: 523.7 10*3/uL — ABNORMAL HIGH (ref 19.0–186.0)
RBC.: 2.53 MIL/uL — ABNORMAL LOW (ref 3.87–5.11)
RETIC CT PCT: 20.7 % — AB (ref 0.4–2.3)

## 2013-12-17 LAB — ABO/RH: ABO/RH(D): B POS

## 2013-12-17 LAB — PREPARE RBC (CROSSMATCH)

## 2013-12-17 MED ORDER — SODIUM CHLORIDE 0.9 % IV SOLN
250.0000 mL | Freq: Once | INTRAVENOUS | Status: AC
  Administered 2013-12-17: 250 mL via INTRAVENOUS

## 2013-12-17 MED ORDER — HEPARIN SOD (PORK) LOCK FLUSH 100 UNIT/ML IV SOLN: 500.0000 [IU] | Freq: Every day | INTRAVENOUS | Status: DC | PRN

## 2013-12-17 MED ORDER — SODIUM CHLORIDE 0.9 % IV SOLN: 250.0000 mL | Freq: Once | INTRAVENOUS | Status: DC

## 2013-12-17 MED ORDER — ACETAMINOPHEN 325 MG PO TABS
650.0000 mg | ORAL_TABLET | Freq: Once | ORAL | Status: AC
  Administered 2013-12-17: 650 mg via ORAL
  Filled 2013-12-17: qty 2

## 2013-12-17 MED ORDER — SODIUM CHLORIDE 0.9 % IJ SOLN: 10.0000 mL | INTRAMUSCULAR | Status: DC | PRN

## 2013-12-17 MED ORDER — SODIUM CHLORIDE 0.9 % IJ SOLN
10.0000 mL | INTRAMUSCULAR | Status: DC | PRN
Start: 2013-12-17 — End: 2013-12-17

## 2013-12-17 MED ORDER — HEPARIN SOD (PORK) LOCK FLUSH 100 UNIT/ML IV SOLN: 250.0000 [IU] | INTRAVENOUS | Status: DC | PRN

## 2013-12-17 MED ORDER — SODIUM CHLORIDE 0.9 % IJ SOLN: 3.0000 mL | INTRAMUSCULAR | Status: DC | PRN

## 2013-12-17 MED ORDER — DIPHENHYDRAMINE HCL 25 MG PO CAPS
50.0000 mg | ORAL_CAPSULE | Freq: Once | ORAL | Status: AC
  Administered 2013-12-17: 50 mg via ORAL
  Filled 2013-12-17: qty 2

## 2013-12-17 NOTE — Discharge Summary (Signed)
Physician Discharge Summary  Patient ID: [name] MRN: [id number] DOB/AGE: 10/27/1970 43 y.o.  Admit date: 12/17/2013 Discharge date: 12/17/2013   Discharge Diagnoses:  Principal Problem:   Anemia complicating pregnancy Active Problems:   Sickle cell anemia affecting pregnancy, antepartum   Consults: None  Significant Diagnostic Studies:  CBC    Component Value Date/Time   WBC 12.1* 12/16/2013 1327   WBC 15.5* 11/11/2013 1201   RBC 2.53* 12/16/2013 1327   RBC 2.55* 12/16/2013 1327   RBC 3.07* 11/11/2013 1201   HGB 7.7* 12/16/2013 1327   HGB 9.1* 11/11/2013 1201   HCT 21.3* 12/16/2013 1327   HCT 26.4* 11/11/2013 1201   PLT 305 12/16/2013 1327   PLT 325 11/11/2013 1201   MCV 83.5 12/16/2013 1327   MCV 86.0 11/11/2013 1201   MCH 30.2 12/16/2013 1327   MCH 29.6 11/11/2013 1201   MCHC 36.2* 12/16/2013 1327   MCHC 34.5 11/11/2013 1201   RDW 25.8* 12/16/2013 1327   RDW 22.3* 11/11/2013 1201   LYMPHSABS 2.0 11/18/2013 0927   LYMPHSABS 2.6 07/22/2013 0931   MONOABS 1.8* 11/18/2013 0927   MONOABS 2.0* 07/22/2013 0931   EOSABS 0.2 11/18/2013 0927   EOSABS 0.3 07/22/2013 0931   BASOSABS 0.1 11/18/2013 0927   BASOSABS 0.1 07/22/2013 0931       Hospital Course: Admitted for transfusion per sickle cell MD.  Transfused 2 u PRBC's.  Tolerated transfusion well.     Disposition: 01-Home or Self Care  Discharged Condition: good  Discharge Instructions   Call MD for:  persistant nausea and vomiting    Complete by:  As directed      Call MD for:  severe uncontrolled pain    Complete by:  As directed      Call MD for:  temperature >100.4    Complete by:  As directed      Diet - low sodium heart healthy    Complete by:  As directed      Practitioner attestation of consent    Complete by:  As directed   I, the ordering practitioner, attest that I have discussed with the patient the benefits, risks, side effects, alternatives, likelihood of achieving goals and potential problems during recovery for the  procedure listed.  Procedure:  Blood Product(s)     Practitioner attestation of consent    Complete by:  As directed   I, the ordering practitioner, attest that I have discussed with the patient the benefits, risks, side effects, alternatives, likelihood of achieving goals and potential problems during recovery for the procedure listed.  Procedure:  Blood Product(s)     Type and screen    Complete by:  Dec 17, 2013             Medication List         acetaminophen 325 MG tablet  Commonly known as:  TYLENOL  Take 650 mg by mouth every 6 (six) hours as needed for mild pain or headache.     calcium carbonate 500 MG chewable tablet  Commonly known as:  TUMS  Chew 2 tablets (400 mg of elemental calcium total) by mouth 2 (two) times daily.     folic acid 1 MG tablet  Commonly known as:  FOLVITE  Take 5 tablets (5 mg total) by mouth daily. Please dispense higher strengths if available     oxyCODONE 5 MG immediate release tablet  Commonly known as:  Oxy IR/ROXICODONE  Take 1 tablet (5 mg total) by  mouth every 4 (four) hours as needed for severe pain.     PRENATAL PO  Take 1 tablet by mouth daily.     ranitidine 150 MG tablet  Commonly known as:  ZANTAC  Take 1 tablet (150 mg total) by mouth 2 (two) times daily.         Signed: Lameisha Schuenemann S 12/17/2013, 5:41 PM

## 2013-12-17 NOTE — Discharge Instructions (Signed)
Blood Transfusion Information  WHAT IS A BLOOD TRANSFUSION?  A transfusion is the replacement of blood or some of its parts. Blood is made up of multiple cells which provide different functions.  · Red blood cells carry oxygen and are used for blood loss replacement.  · White blood cells fight against infection.  · Platelets control bleeding.  · Plasma helps clot blood.  · Other blood products are available for specialized needs, such as hemophilia or other clotting disorders.  BEFORE THE TRANSFUSION   Who gives blood for transfusions?   · You may be able to donate blood to be used at a later date on yourself (autologous donation).  · Relatives can be asked to donate blood. This is generally not any safer than if you have received blood from a stranger. The same precautions are taken to ensure safety when a relative's blood is donated.  · Healthy volunteers who are fully evaluated to make sure their blood is safe. This is blood bank blood.  Transfusion therapy is the safest it has ever been in the practice of medicine. Before blood is taken from a donor, a complete history is taken to make sure that person has no history of diseases nor engages in risky social behavior (examples are intravenous drug use or sexual activity with multiple partners). The donor's travel history is screened to minimize risk of transmitting infections, such as malaria. The donated blood is tested for signs of infectious diseases, such as HIV and hepatitis. The blood is then tested to be sure it is compatible with you in order to minimize the chance of a transfusion reaction. If you or a relative donates blood, this is often done in anticipation of surgery and is not appropriate for emergency situations. It takes many days to process the donated blood.  RISKS AND COMPLICATIONS  Although transfusion therapy is very safe and saves many lives, the main dangers of transfusion include:   · Getting an infectious disease.  · Developing a  transfusion reaction. This is an allergic reaction to something in the blood you were given. Every precaution is taken to prevent this.  The decision to have a blood transfusion has been considered carefully by your caregiver before blood is given. Blood is not given unless the benefits outweigh the risks.  AFTER THE TRANSFUSION  · Right after receiving a blood transfusion, you will usually feel much better and more energetic. This is especially true if your red blood cells have gotten low (anemic). The transfusion raises the level of the red blood cells which carry oxygen, and this usually causes an energy increase.  · The nurse administering the transfusion will monitor you carefully for complications.  HOME CARE INSTRUCTIONS   No special instructions are needed after a transfusion. You may find your energy is better. Speak with your caregiver about any limitations on activity for underlying diseases you may have.  SEEK MEDICAL CARE IF:   · Your condition is not improving after your transfusion.  · You develop redness or irritation at the intravenous (IV) site.  SEEK IMMEDIATE MEDICAL CARE IF:   Any of the following symptoms occur over the next 12 hours:  · Shaking chills.  · You have a temperature by mouth above 102° F (38.9° C), not controlled by medicine.  · Chest, back, or muscle pain.  · People around you feel you are not acting correctly or are confused.  · Shortness of breath or difficulty breathing.  · Dizziness and fainting.  ·   You get a rash or develop hives.  · You have a decrease in urine output.  · Your urine turns a dark color or changes to pink, red, or brown.  Any of the following symptoms occur over the next 10 days:  · You have a temperature by mouth above 102° F (38.9° C), not controlled by medicine.  · Shortness of breath.  · Weakness after normal activity.  · The white part of the eye turns yellow (jaundice).  · You have a decrease in the amount of urine or are urinating less often.  · Your  urine turns a dark color or changes to pink, red, or brown.  Document Released: 05/03/2000 Document Revised: 07/29/2011 Document Reviewed: 12/21/2007  ExitCare® Patient Information ©2015 [employer]. This information is not intended to replace advice given to you by your health care provider. Make sure you discuss any questions you have with your health care provider.

## 2013-12-18 LAB — TYPE AND SCREEN
ABO/RH(D): B POS
ANTIBODY SCREEN: NEGATIVE
UNIT DIVISION: 0
UNIT DIVISION: 0

## 2013-12-19 LAB — CULTURE, OB URINE

## 2013-12-20 ENCOUNTER — Other Ambulatory Visit: Payer: Self-pay | Admitting: Family Medicine

## 2013-12-20 ENCOUNTER — Ambulatory Visit (INDEPENDENT_AMBULATORY_CARE_PROVIDER_SITE_OTHER): Payer: Medicaid Other | Admitting: *Deleted

## 2013-12-20 VITALS — BP 128/77 | HR 89

## 2013-12-20 DIAGNOSIS — O365991 Maternal care for other known or suspected poor fetal growth, unspecified trimester, fetus 1: Secondary | ICD-10-CM

## 2013-12-20 DIAGNOSIS — O09523 Supervision of elderly multigravida, third trimester: Secondary | ICD-10-CM

## 2013-12-20 DIAGNOSIS — O34219 Maternal care for unspecified type scar from previous cesarean delivery: Secondary | ICD-10-CM

## 2013-12-20 DIAGNOSIS — D571 Sickle-cell disease 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: Secondary | ICD-10-CM

## 2013-12-20 DIAGNOSIS — O09299 Supervision of pregnancy with other poor reproductive or obstetric history, unspecified trimester: Secondary | ICD-10-CM

## 2013-12-20 DIAGNOSIS — O09529 Supervision of elderly multigravida, unspecified trimester: Secondary | ICD-10-CM

## 2013-12-20 DIAGNOSIS — Z8751 Personal history of pre-term labor: Secondary | ICD-10-CM

## 2013-12-20 NOTE — Progress Notes (Signed)
Weekly AFI & dopplers @ [facility] due to upper nml doppler and developing IUGR - next scheduled on 8/6.

## 2013-12-20 NOTE — Progress Notes (Signed)
NST reviewed and reactive.  [name] L. Harraway-Smith, M.D., FACOG    

## 2013-12-20 NOTE — Progress Notes (Signed)
Post discharge chart review completed.  

## 2013-12-23 ENCOUNTER — Encounter: Payer: Self-pay | Admitting: Obstetrics & Gynecology

## 2013-12-23 ENCOUNTER — Inpatient Hospital Stay (HOSPITAL_COMMUNITY)
Admission: AD | Admit: 2013-12-23 | Discharge: 2013-12-23 | Disposition: A | Discharge status: Home / Self Care | Payer: Medicaid Other | Source: Ambulatory Visit | Attending: Obstetrics & Gynecology | Admitting: Obstetrics & Gynecology

## 2013-12-23 ENCOUNTER — Ambulatory Visit (HOSPITAL_COMMUNITY)
Admission: RE | Admit: 2013-12-23 | Discharge: 2013-12-23 | Disposition: A | Discharge status: Home / Self Care | Payer: Medicaid Other | Source: Ambulatory Visit | Attending: Obstetrics & Gynecology | Admitting: Obstetrics & Gynecology

## 2013-12-23 ENCOUNTER — Encounter (HOSPITAL_COMMUNITY): Payer: Self-pay | Admitting: *Deleted

## 2013-12-23 ENCOUNTER — Ambulatory Visit (INDEPENDENT_AMBULATORY_CARE_PROVIDER_SITE_OTHER): Payer: Medicaid Other | Admitting: Obstetrics & Gynecology

## 2013-12-23 ENCOUNTER — Encounter (HOSPITAL_COMMUNITY): Payer: Self-pay

## 2013-12-23 VITALS — BP 136/81 | HR 84 | Temp 98.2°F | Wt 146.9 lb

## 2013-12-23 DIAGNOSIS — O365990 Maternal care for other known or suspected poor fetal growth, unspecified trimester, not applicable or unspecified: Secondary | ICD-10-CM | POA: Insufficient documentation

## 2013-12-23 DIAGNOSIS — D571 Sickle-cell disease 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: Secondary | ICD-10-CM | POA: Insufficient documentation

## 2013-12-23 DIAGNOSIS — O09299 Supervision of pregnancy with other poor reproductive or obstetric history, unspecified trimester: Secondary | ICD-10-CM

## 2013-12-23 DIAGNOSIS — O99019 Anemia complicating pregnancy, unspecified trimester: Secondary | ICD-10-CM | POA: Insufficient documentation

## 2013-12-23 DIAGNOSIS — O09529 Supervision of elderly multigravida, unspecified trimester: Secondary | ICD-10-CM | POA: Insufficient documentation

## 2013-12-23 DIAGNOSIS — Z833 Family history of diabetes mellitus: Secondary | ICD-10-CM | POA: Diagnosis not present

## 2013-12-23 DIAGNOSIS — O34219 Maternal care for unspecified type scar from previous cesarean delivery: Secondary | ICD-10-CM

## 2013-12-23 DIAGNOSIS — O99891 Other specified diseases and conditions complicating pregnancy: Secondary | ICD-10-CM | POA: Diagnosis present

## 2013-12-23 DIAGNOSIS — Z832 Family history of diseases of the blood and blood-forming organs and certain disorders involving the immune mechanism: Secondary | ICD-10-CM | POA: Insufficient documentation

## 2013-12-23 DIAGNOSIS — O365991 Maternal care for other known or suspected poor fetal growth, unspecified trimester, fetus 1: Secondary | ICD-10-CM

## 2013-12-23 DIAGNOSIS — O09893 Supervision of other high risk pregnancies, third trimester: Secondary | ICD-10-CM

## 2013-12-23 DIAGNOSIS — O09293 Supervision of pregnancy with other poor reproductive or obstetric history, third trimester: Secondary | ICD-10-CM

## 2013-12-23 DIAGNOSIS — O368390 Maternal care for abnormalities of the fetal heart rate or rhythm, unspecified trimester, not applicable or unspecified: Secondary | ICD-10-CM

## 2013-12-23 DIAGNOSIS — D573 Sickle-cell trait: Secondary | ICD-10-CM | POA: Diagnosis not present

## 2013-12-23 DIAGNOSIS — O09213 Supervision of pregnancy with history of pre-term labor, third trimester: Secondary | ICD-10-CM

## 2013-12-23 DIAGNOSIS — O09523 Supervision of elderly multigravida, third trimester: Secondary | ICD-10-CM

## 2013-12-23 DIAGNOSIS — Z8751 Personal history of pre-term labor: Secondary | ICD-10-CM | POA: Insufficient documentation

## 2013-12-23 LAB — POCT URINALYSIS DIP (DEVICE)
Glucose, UA: NEGATIVE mg/dL
Ketones, ur: NEGATIVE mg/dL
Leukocytes, UA: NEGATIVE
NITRITE: NEGATIVE
PH: 6.5 (ref 5.0–8.0)
Specific Gravity, Urine: 1.015 (ref 1.005–1.030)
Urobilinogen, UA: 0.2 mg/dL (ref 0.0–1.0)

## 2013-12-23 MED ORDER — ASPIRIN 81 MG PO TABS: 81.0000 mg | ORAL_TABLET | Freq: Every day | ORAL | Status: DC

## 2013-12-23 NOTE — Discharge Instructions (Signed)

## 2013-12-23 NOTE — MAU Provider Note (Signed)
  History     CSN: [id number]  Arrival date and time: 12/23/13 1217   None     No chief complaint on file.  HPI  Pt sent from [facility] after nonreactive NST and BPP 6/8 (-2 breathing).    43 y.o. [id number].  +FM, denies LOF, VB, contractions, vaginal discharge.  Past Medical History  Diagnosis Date  . Sickle cell disease   . Anemia 03/30/2012    Hx of sickle cell disease  . Leg ulcer 10/27/2012    Chronic under care of wound clinic  . Cholelithiasis y-1  . H/O miscarriage, currently pregnant   . CAP (community acquired pneumonia)     2014  . Anemia complicating pregnancy 4/00/8676    Past Surgical History  Procedure Laterality Date  . Cesarean section      Family History  Problem Relation Age of Onset  . Sickle cell anemia Sister   . Diabetes Mother     History  Substance Use Topics  . Smoking status: Never Smoker   . Smokeless tobacco: Never Used  . Alcohol Use: No    Allergies: No Known Allergies  Prescriptions prior to admission  Medication Sig Dispense Refill  . acetaminophen (TYLENOL) 325 MG tablet Take 650 mg by mouth every 6 (six) hours as needed for mild pain or headache.       . calcium carbonate (TUMS) 500 MG chewable tablet Chew 2 tablets (400 mg of elemental calcium total) by mouth 2 (two) times daily.  195 tablet  3  . folic acid (FOLVITE) 1 MG tablet Take 5 tablets (5 mg total) by mouth daily. Please dispense higher strengths if available  300 tablet  3  . oxyCODONE (OXY IR/ROXICODONE) 5 MG immediate release tablet Take 1 tablet (5 mg total) by mouth every 4 (four) hours as needed for severe pain.  60 tablet  0  . Prenatal Vit-Fe Fumarate-FA (PRENATAL PO) Take 1 tablet by mouth daily.      . ranitidine (ZANTAC) 150 MG tablet Take 1 tablet (150 mg total) by mouth 2 (two) times daily.  60 tablet  3  . aspirin 81 MG tablet Take 1 tablet (81 mg total) by mouth daily.  30 tablet  6    Review of Systems  Constitutional: Negative for chills, weight  loss and malaise/fatigue.  Respiratory: Negative for cough and shortness of breath.   Cardiovascular: Negative for chest pain.  Genitourinary: Negative for dysuria and urgency.   Physical Exam   Blood pressure 149/79, pulse 65, temperature 97.8 F (36.6 C), temperature source Oral, resp. rate 20, last menstrual period 04/28/2013.  Physical Exam  Constitutional: She is oriented to person, place, and time. She appears well-developed and well-nourished.  HENT:  Head: Normocephalic and atraumatic.  Eyes: Conjunctivae and EOM are normal.  Neck: Normal range of motion.  Cardiovascular: Normal rate.   Respiratory: Effort normal. No respiratory distress.  GI: Soft. She exhibits no distension. There is no tenderness.  Gravid  Musculoskeletal: Normal range of motion. She exhibits no edema.  Neurological: She is alert and oriented to person, place, and time.  Skin: Skin is warm and dry. No erythema.    MAU Course  Procedures  MDM NST - reactive  Assessment and Plan  NST reactive, BPP now 8/10.  Will discharge, fetal kick counts reinforced, pt to return to MAU Saturday for NST, has clinic appt Monday.  Pt agreeable with plan.  Jaymen Fetch ROCIO 12/23/2013, 2:03 PM

## 2013-12-23 NOTE — Progress Notes (Signed)
C/o intermittent pain in lower back and  Pelvic pressure

## 2013-12-23 NOTE — Patient Instructions (Signed)
Trial of Labor After Cesarean Delivery Information A trial of labor after cesarean delivery (TOLAC) is when a woman tries to give birth vaginally after a previous cesarean delivery. TOLAC may be a safe and appropriate option for you depending on your medical history and other risk factors. When TOLAC is successful and you are able to have a vaginal delivery, this is called a vaginal birth after cesarean delivery (VBAC).  CANDIDATES FOR TOLAC TOLAC is possible for some women who:  Have undergone one or two prior cesarean deliveries in which the incision of the uterus was horizontal (low transverse).  Are carrying twins and have had one prior low transverse incision during a cesarean delivery.  Do not have a vertical (classical) uterine scar.  Have not had a tear in the wall of their uterus (uterine rupture). TOLAC is also supported for women who meet appropriate criteria and:  Are under the age of 40 years.  Are tall and have a body mass index (BMI) of less than 30.  Have an unknown uterine scar.  Give birth in a facility equipped to handle an emergency cesarean delivery. This team should be able to handle possible complications such as a uterine rupture.  Have thorough counseling about the benefits and risks of TOLAC.  Have discussed future pregnancy plans with their health care provider.  Plan to have several more pregnancies. MOST SUCCESSFUL CANDIDATES FOR TOLAC:  Have had a successful vaginal delivery before or after their cesarean delivery.  Experience labor that begins naturally on or before the due date (40 weeks of gestation).  Do not have a very large (macrosomic) baby.   Had a prior cesarean delivery but are not currently experiencing factors that would prompt a cesarean delivery (such as a breech position).  Had only one prior cesarean delivery.  Had a prior cesarean delivery that was performed early in labor and not after full cervical dilation. TOLAC may be most  appropriate for women who meet the above guidelines and who plan to have more pregnancies. TOLAC is not recommended for home births. LEAST SUCCESSFUL CANDIDATES FOR TOLAC:  Have an induced labor with an unfavorable cervix. An unfavorable cervix is when the cervix is not dilating enough (among other factors).  Have never had a vaginal delivery.  Have had more than two cesarean deliveries.  Have a pregnancy at more than 40 weeks of gestation.  Are pregnant with a baby with a suspected weight greater than 4,000 grams (8 pounds) and who have no prior history of a vaginal delivery.  Have closely spaced pregnancies. SUGGESTED BENEFITS OF TOLAC  You may have a faster recovery time.  You may have a shorter stay in the hospital.  You may have less pain and fewer problems than with a cesarean delivery. Women who have a cesarean delivery have a higher chance of needing blood or getting a fever, an infection, or a blood clot in the legs. SUGGESTED RISKS OF TOLAC The highest risk of complications happens to women who attempt a TOLAC and fail. A failed TOLAC results in an unplanned cesarean delivery. Risks related to TOLAC or repeat cesarean deliveries include:   Blood loss.  Infection.  Blood clot.  Injury to surrounding tissues or organs.  Having to remove the uterus (hysterectomy).  Potential problems with the placenta (such as placenta previa or placenta accreta) in future pregnancies. Although very rare, the main concerns with TOLAC are:  Rupture of the uterine scar from a past cesarean delivery.  Needing an   emergency cesarean delivery.  Having a bad outcome for the baby (perinatal morbidity). FOR MORE INFORMATION American Congress of Obstetricians and Gynecologists: www.acog.org American College of Nurse-Midwives: www.midwife.org Document Released: 01/22/2011 Document Revised: 02/24/2013 Document Reviewed: 10/26/2012 Exit[employer] Patient Information 2015 [employer]. This  information is not intended to replace advice given to you by your health care provider. Make sure you discuss any questions you have with your health care provider.  

## 2013-12-23 NOTE — Progress Notes (Signed)
[place] weekly @ [facility] for AFI/doppler - today @ 1030

## 2013-12-23 NOTE — MAU Note (Signed)
Pt from [place] after nonreactive NST.  BPP 6/8.  Hx of IUFD. Advanced maternal age.  Good fetal movement.  Denies vaginal bleeding or ROM.  Denies any pain or contractions.

## 2013-12-23 NOTE — Progress Notes (Signed)
Pt with no complaints. TOLAC papers reviewed and signed today Needs to begin ASA due to prior h/o preeclampsia (pt left without being notified. Will have nurse call pt) To MFM for dopplers and BPP

## 2013-12-24 ENCOUNTER — Encounter: Payer: Self-pay | Admitting: *Deleted

## 2013-12-25 ENCOUNTER — Inpatient Hospital Stay (HOSPITAL_COMMUNITY)
Admission: AD | Admit: 2013-12-25 | Discharge: 2013-12-25 | Disposition: A | Discharge status: Home / Self Care | Payer: Medicaid Other | Source: Ambulatory Visit | Attending: Family Medicine | Admitting: Family Medicine

## 2013-12-25 DIAGNOSIS — O365990 Maternal care for other known or suspected poor fetal growth, unspecified trimester, not applicable or unspecified: Secondary | ICD-10-CM | POA: Insufficient documentation

## 2013-12-25 DIAGNOSIS — O09529 Supervision of elderly multigravida, unspecified trimester: Secondary | ICD-10-CM | POA: Insufficient documentation

## 2013-12-25 DIAGNOSIS — Z3689 Encounter for other specified antenatal screening: Secondary | ICD-10-CM

## 2013-12-25 NOTE — Discharge Instructions (Signed)
Preterm Labor Information  Preterm labor is when labor starts at less than 37 weeks of pregnancy. The normal length of a pregnancy is 39 to 41 weeks.  CAUSES  Often, there is no identifiable underlying cause as to why a woman goes into preterm labor. One of the most common known causes of preterm labor is infection. Infections of the uterus, cervix, vagina, amniotic sac, bladder, kidney, or even the lungs (pneumonia) can cause labor to start. Other suspected causes of preterm labor include:  Urogenital infections, such as yeast infections and bacterial vaginosis.  Uterine abnormalities (uterine shape, uterine septum, fibroids, or bleeding from the placenta).  A cervix that has been operated on (it may fail to stay closed).  Malformations in the fetus.  Multiple gestations (twins, triplets, and so on).  Breakage of the amniotic sac.  RISK FACTORS  Having a previous history of preterm labor.  Having premature rupture of membranes (PROM).  Having a placenta that covers the opening of the cervix (placenta previa).  Having a placenta that separates from the uterus (placental abruption).  Having a cervix that is too weak to hold the fetus in the uterus (incompetent cervix).  Having too much fluid in the amniotic sac (polyhydramnios).  Taking illegal drugs or smoking while pregnant.  Not gaining enough weight while pregnant.  Being younger than 22 and older than 42 years old.  Having a low socioeconomic status.  Being African American. SYMPTOMS  Signs and symptoms of preterm labor include:  Menstrual-like cramps, abdominal pain, or back pain.  Uterine contractions that are regular, as frequent as six in an hour, regardless of their intensity (may be mild or painful).  Contractions that start on the top of the uterus and spread down to the lower abdomen and back.  A sense of increased pelvic pressure.  A watery or bloody mucus discharge that comes from the vagina.  TREATMENT  Depending on the  length of the pregnancy and other circumstances, your health care provider may suggest bed rest. If necessary, there are medicines that can be given to stop contractions and to mature the fetal lungs. If labor happens before 34 weeks of pregnancy, a prolonged hospital stay may be recommended. Treatment depends on the condition of both you and the fetus.  WHAT SHOULD YOU DO IF YOU THINK YOU ARE IN PRETERM LABOR?  Call your health care provider right away. You will need to go to the hospital to get checked immediately.  HOW CAN YOU PREVENT PRETERM LABOR IN FUTURE PREGNANCIES?  You should:  Stop smoking if you smoke.  Maintain healthy weight gain and avoid chemicals and drugs that are not necessary.  Be watchful for any type of infection.  Inform your health care provider if you have a known history of preterm labor.  Fetal Movement Counts Patient Name: __________________________________________________ Patient Due Date: ____________________ Performing a fetal movement count is highly recommended in high-risk pregnancies, but it is good for every pregnant woman to do. Your health care provider may ask you to start counting fetal movements at 28 weeks of the pregnancy. Fetal movements often increase: After eating a full meal. After physical activity. After eating or drinking something sweet or cold. At rest. Pay attention to when you feel the baby is most active. This will help you notice a pattern of your baby's sleep and wake cycles and what factors contribute to an increase in fetal movement. It is important to perform a fetal movement count at the same time each  day when your baby is normally most active.  HOW TO COUNT FETAL MOVEMENTS Find a quiet and comfortable area to sit or lie down on your left side. Lying on your left side provides the best blood and oxygen circulation to your baby. Write down the day and time on a sheet of paper or in a journal. Start counting kicks, flutters, swishes,  rolls, or jabs in a 2-hour period. You should feel at least 10 movements within 2 hours. If you do not feel 10 movements in 2 hours, wait 2-3 hours and count again. Look for a change in the pattern or not enough counts in 2 hours. SEEK MEDICAL CARE IF: You feel less than 10 counts in 2 hours, tried twice. There is no movement in over an hour. The pattern is changing or taking longer each day to reach 10 counts in 2 hours. You feel the baby is not moving as he or she usually does. Date: ____________ Movements: ____________ Start time: ____________ Elizebeth Koller time: ____________  Date: ____________ Movements: ____________ Start time: ____________ Elizebeth Koller time: ____________ Date: ____________ Movements: ____________ Start time: ____________ Elizebeth Koller time: ____________ Date: ____________ Movements: ____________ Start time: ____________ Elizebeth Koller time: ____________ Date: ____________ Movements: ____________ Start time: ____________ Elizebeth Koller time: ____________ Date: ____________ Movements: ____________ Start time: ____________ Elizebeth Koller time: ____________ Date: ____________ Movements: ____________ Start time: ____________ Elizebeth Koller time: ____________ Date: ____________ Movements: ____________ Start time: ____________ Elizebeth Koller time: ____________  Date: ____________ Movements: ____________ Start time: ____________ Elizebeth Koller time: ____________ Date: ____________ Movements: ____________ Start time: ____________ Elizebeth Koller time: ____________ Date: ____________ Movements: ____________ Start time: ____________ Elizebeth Koller time: ____________ Date: ____________ Movements: ____________ Start time: ____________ Elizebeth Koller time: ____________ Date: ____________ Movements: ____________ Start time: ____________ Elizebeth Koller time: ____________ Date: ____________ Movements: ____________ Start time: ____________ Elizebeth Koller time: ____________ Date: ____________ Movements: ____________ Start time: ____________ Elizebeth Koller time: ____________  Date: ____________ Movements:  ____________ Start time: ____________ Elizebeth Koller time: ____________ Date: ____________ Movements: ____________ Start time: ____________ Elizebeth Koller time: ____________ Date: ____________ Movements: ____________ Start time: ____________ Elizebeth Koller time: ____________ Date: ____________ Movements: ____________ Start time: ____________ Elizebeth Koller time: ____________ Date: ____________ Movements: ____________ Start time: ____________ Elizebeth Koller time: ____________ Date: ____________ Movements: ____________ Start time: ____________ Elizebeth Koller time: ____________ Date: ____________ Movements: ____________ Start time: ____________ Elizebeth Koller time: ____________  Date: ____________ Movements: ____________ Start time: ____________ Elizebeth Koller time: ____________ Date: ____________ Movements: ____________ Start time: ____________ Elizebeth Koller time: ____________ Date: ____________ Movements: ____________ Start time: ____________ Elizebeth Koller time: ____________ Date: ____________ Movements: ____________ Start time: ____________ Elizebeth Koller time: ____________ Date: ____________ Movements: ____________ Start time: ____________ Elizebeth Koller time: ____________ Date: ____________ Movements: ____________ Start time: ____________ Elizebeth Koller time: ____________ Date: ____________ Movements: ____________ Start time: ____________ Elizebeth Koller time: ____________  Date: ____________ Movements: ____________ Start time: ____________ Elizebeth Koller time: ____________ Date: ____________ Movements: ____________ Start time: ____________ Elizebeth Koller time: ____________ Date: ____________ Movements: ____________ Start time: ____________ Elizebeth Koller time: ____________ Date: ____________ Movements: ____________ Start time: ____________ Elizebeth Koller time: ____________ Date: ____________ Movements: ____________ Start time: ____________ Elizebeth Koller time: ____________ Date: ____________ Movements: ____________ Start time: ____________ Elizebeth Koller time: ____________ Date: ____________ Movements: ____________ Start time: ____________ Elizebeth Koller  time: ____________  Date: ____________ Movements: ____________ Start time: ____________ Elizebeth Koller time: ____________ Date: ____________ Movements: ____________ Start time: ____________ Elizebeth Koller time: ____________ Date: ____________ Movements: ____________ Start time: ____________ Elizebeth Koller time: ____________ Date: ____________ Movements: ____________ Start time: ____________ Elizebeth Koller time: ____________ Date: ____________ Movements: ____________ Start time: ____________ Elizebeth Koller time: ____________ Date: ____________ Movements: ____________ Start time: ____________ Elizebeth Koller time: ____________ Date: ____________ Movements: ____________ Start time: ____________ Elizebeth Koller  time: ____________  Date: ____________ Movements: ____________ Start time: ____________ Elizebeth Koller time: ____________ Date: ____________ Movements: ____________ Start time: ____________ Elizebeth Koller time: ____________ Date: ____________ Movements: ____________ Start time: ____________ Elizebeth Koller time: ____________ Date: ____________ Movements: ____________ Start time: ____________ Elizebeth Koller time: ____________ Date: ____________ Movements: ____________ Start time: ____________ Elizebeth Koller time: ____________ Date: ____________ Movements: ____________ Start time: ____________ Elizebeth Koller time: ____________ Date: ____________ Movements: ____________ Start time: ____________ Elizebeth Koller time: ____________  Date: ____________ Movements: ____________ Start time: ____________ Elizebeth Koller time: ____________ Date: ____________ Movements: ____________ Start time: ____________ Elizebeth Koller time: ____________ Date: ____________ Movements: ____________ Start time: ____________ Elizebeth Koller time: ____________ Date: ____________ Movements: ____________ Start time: ____________ Elizebeth Koller time: ____________ Date: ____________ Movements: ____________ Start time: ____________ Elizebeth Koller time: ____________ Date: ____________ Movements: ____________ Start time: ____________ Elizebeth Koller time: ____________ Document Released:  06/05/2006 Document Revised: 09/20/2013 Document Reviewed: 03/02/2012 Exit[employer] Patient Information 2015 [place]. This information is not intended to replace advice given to you by your health care provider. Make sure you discuss any questions you have with your health care provider.

## 2013-12-27 ENCOUNTER — Ambulatory Visit (INDEPENDENT_AMBULATORY_CARE_PROVIDER_SITE_OTHER): Payer: Medicaid Other | Admitting: *Deleted

## 2013-12-27 VITALS — BP 117/72 | HR 92

## 2013-12-27 DIAGNOSIS — O09523 Supervision of elderly multigravida, third trimester: Secondary | ICD-10-CM

## 2013-12-27 DIAGNOSIS — O09529 Supervision of elderly multigravida, unspecified trimester: Secondary | ICD-10-CM

## 2013-12-27 NOTE — Progress Notes (Addendum)
Pt advised of need to begin taking ASA 81 mg daily.  NST 12/27/13 reactive  Woodroe Mode, MD

## 2013-12-28 ENCOUNTER — Other Ambulatory Visit: Payer: Self-pay | Admitting: Obstetrics & Gynecology

## 2013-12-28 DIAGNOSIS — Z8751 Personal history of pre-term labor: Secondary | ICD-10-CM

## 2013-12-28 DIAGNOSIS — O289 Unspecified abnormal findings on antenatal screening of mother: Secondary | ICD-10-CM

## 2013-12-28 DIAGNOSIS — D571 Sickle-cell disease 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: Secondary | ICD-10-CM

## 2013-12-28 DIAGNOSIS — O09299 Supervision of pregnancy with other poor reproductive or obstetric history, unspecified trimester: Secondary | ICD-10-CM

## 2013-12-28 DIAGNOSIS — O09523 Supervision of elderly multigravida, third trimester: Secondary | ICD-10-CM

## 2013-12-28 DIAGNOSIS — O3421 Maternal care for scar from previous cesarean delivery: Secondary | ICD-10-CM

## 2013-12-28 DIAGNOSIS — O365931 Maternal care for other known or suspected poor fetal growth, third trimester, fetus 1: Secondary | ICD-10-CM

## 2013-12-30 ENCOUNTER — Other Ambulatory Visit (HOSPITAL_BASED_OUTPATIENT_CLINIC_OR_DEPARTMENT_OTHER): Payer: Medicaid Other

## 2013-12-30 ENCOUNTER — Other Ambulatory Visit: Payer: Self-pay | Admitting: Obstetrics & Gynecology

## 2013-12-30 ENCOUNTER — Telehealth: Payer: Self-pay | Admitting: *Deleted

## 2013-12-30 ENCOUNTER — Ambulatory Visit (HOSPITAL_COMMUNITY)
Admission: RE | Admit: 2013-12-30 | Discharge: 2013-12-30 | Disposition: A | Discharge status: Home / Self Care | Payer: Medicaid Other | Source: Ambulatory Visit | Attending: Obstetrics & Gynecology | Admitting: Obstetrics & Gynecology

## 2013-12-30 ENCOUNTER — Ambulatory Visit (INDEPENDENT_AMBULATORY_CARE_PROVIDER_SITE_OTHER): Payer: Medicaid Other | Admitting: Family Medicine

## 2013-12-30 VITALS — BP 140/72 | HR 86 | Wt 150.0 lb

## 2013-12-30 VITALS — BP 133/73 | HR 77 | Temp 97.6°F | Wt 149.6 lb

## 2013-12-30 DIAGNOSIS — O358XX0 Maternal care for other (suspected) fetal abnormality and damage, not applicable or unspecified: Secondary | ICD-10-CM

## 2013-12-30 DIAGNOSIS — O09299 Supervision of pregnancy with other poor reproductive or obstetric history, unspecified trimester: Secondary | ICD-10-CM | POA: Diagnosis not present

## 2013-12-30 DIAGNOSIS — O09523 Supervision of elderly multigravida, third trimester: Secondary | ICD-10-CM

## 2013-12-30 DIAGNOSIS — O365931 Maternal care for other known or suspected poor fetal growth, third trimester, fetus 1: Secondary | ICD-10-CM

## 2013-12-30 DIAGNOSIS — Z8751 Personal history of pre-term labor: Secondary | ICD-10-CM

## 2013-12-30 DIAGNOSIS — Z3490 Encounter for supervision of normal pregnancy, unspecified, unspecified trimester: Secondary | ICD-10-CM

## 2013-12-30 DIAGNOSIS — O365990 Maternal care for other known or suspected poor fetal growth, unspecified trimester, not applicable or unspecified: Secondary | ICD-10-CM | POA: Insufficient documentation

## 2013-12-30 DIAGNOSIS — O09529 Supervision of elderly multigravida, unspecified trimester: Secondary | ICD-10-CM

## 2013-12-30 DIAGNOSIS — Z3480 Encounter for supervision of other normal pregnancy, unspecified trimester: Secondary | ICD-10-CM

## 2013-12-30 DIAGNOSIS — D573 Sickle-cell trait: Secondary | ICD-10-CM | POA: Insufficient documentation

## 2013-12-30 DIAGNOSIS — O99019 Anemia complicating pregnancy, unspecified trimester: Secondary | ICD-10-CM | POA: Insufficient documentation

## 2013-12-30 DIAGNOSIS — Z3493 Encounter for supervision of normal pregnancy, unspecified, third trimester: Secondary | ICD-10-CM

## 2013-12-30 DIAGNOSIS — D571 Sickle-cell disease 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: Secondary | ICD-10-CM

## 2013-12-30 DIAGNOSIS — O9989 Other specified diseases and conditions complicating pregnancy, childbirth and the puerperium: Secondary | ICD-10-CM

## 2013-12-30 DIAGNOSIS — O99891 Other specified diseases and conditions complicating pregnancy: Secondary | ICD-10-CM

## 2013-12-30 DIAGNOSIS — O365991 Maternal care for other known or suspected poor fetal growth, unspecified trimester, fetus 1: Secondary | ICD-10-CM

## 2013-12-30 DIAGNOSIS — O3421 Maternal care for scar from previous cesarean delivery: Secondary | ICD-10-CM

## 2013-12-30 DIAGNOSIS — O34219 Maternal care for unspecified type scar from previous cesarean delivery: Secondary | ICD-10-CM | POA: Insufficient documentation

## 2013-12-30 DIAGNOSIS — D649 Anemia, unspecified: Secondary | ICD-10-CM

## 2013-12-30 LAB — POCT URINALYSIS DIP (DEVICE)
Bilirubin Urine: NEGATIVE
GLUCOSE, UA: NEGATIVE mg/dL
Ketones, ur: NEGATIVE mg/dL
Leukocytes, UA: NEGATIVE
NITRITE: NEGATIVE
PROTEIN: 100 mg/dL — AB
SPECIFIC GRAVITY, URINE: 1.015 (ref 1.005–1.030)
UROBILINOGEN UA: 0.2 mg/dL (ref 0.0–1.0)
pH: 7 (ref 5.0–8.0)

## 2013-12-30 LAB — CBC & DIFF AND RETIC
BASO%: 1.1 % (ref 0.0–2.0)
Basophils Absolute: 0.1 10*3/uL (ref 0.0–0.1)
EOS%: 1 % (ref 0.0–7.0)
Eosinophils Absolute: 0.1 10*3/uL (ref 0.0–0.5)
HEMATOCRIT: 24.7 % — AB (ref 34.8–46.6)
HGB: 8.7 g/dL — ABNORMAL LOW (ref 11.6–15.9)
LYMPH%: 12.6 % — AB (ref 14.0–49.7)
MCH: 30.1 pg (ref 25.1–34.0)
MCHC: 35.5 g/dL (ref 31.5–36.0)
MCV: 85 fL (ref 79.5–101.0)
MONO#: 1.9 10*3/uL — ABNORMAL HIGH (ref 0.1–0.9)
MONO%: 14.2 % — AB (ref 0.0–14.0)
NEUT#: 9.4 10*3/uL — ABNORMAL HIGH (ref 1.5–6.5)
NEUT%: 71.1 % (ref 38.4–76.8)
PLATELETS: 358 10*3/uL (ref 145–400)
RBC: 2.9 10*6/uL — AB (ref 3.70–5.45)
RDW: 21.5 % — ABNORMAL HIGH (ref 11.2–14.5)
WBC: 13.1 10*3/uL — AB (ref 3.9–10.3)
lymph#: 1.7 10*3/uL (ref 0.9–3.3)

## 2013-12-30 LAB — TECHNOLOGIST REVIEW

## 2013-12-30 LAB — HOLD TUBE, BLOOD BANK

## 2013-12-30 LAB — RETICULOCYTES (CHCC)
ABS Retic: 432 10*3/uL — ABNORMAL HIGH (ref 19.0–186.0)
RBC.: 2.88 MIL/uL — ABNORMAL LOW (ref 3.87–5.11)
Retic Ct Pct: 15 % — ABNORMAL HIGH (ref 0.4–2.3)

## 2013-12-30 LAB — OB RESULTS CONSOLE GC/CHLAMYDIA
Chlamydia: NEGATIVE
Gonorrhea: NEGATIVE

## 2013-12-30 LAB — OB RESULTS CONSOLE GBS: STREP GROUP B AG: NEGATIVE

## 2013-12-30 NOTE — Progress Notes (Signed)
Cultures today 

## 2013-12-30 NOTE — Telephone Encounter (Signed)
Informed pt of Hgb 8.7 today and no need for transfusion.  Keep appt w/ Dr. Alvy Bimler next week as scheduled.  Pt scheduled for office visit only on 8/20 and asks if she needs to have lab too?

## 2013-12-30 NOTE — Progress Notes (Signed)
Patient is 43 y.o. [id number] [redacted]w[redacted]d.  +FM, denies LOF, VB, contractions, vaginal discharge.  Overall feeling well. - GBS today, NST reactive

## 2013-12-30 NOTE — Progress Notes (Signed)
[place] today @ MFM 1100.  Pt still has not started ASA 81 mg daily- she states she will obtain medication today.

## 2013-12-31 ENCOUNTER — Other Ambulatory Visit: Payer: Self-pay | Admitting: *Deleted

## 2013-12-31 LAB — GC/CHLAMYDIA PROBE AMP
CT Probe RNA: NEGATIVE
GC Probe RNA: NEGATIVE

## 2013-12-31 NOTE — Telephone Encounter (Signed)
Yes labs

## 2014-01-01 LAB — CULTURE, BETA STREP (GROUP B ONLY)

## 2014-01-03 ENCOUNTER — Telehealth: Payer: Self-pay | Admitting: Hematology and Oncology

## 2014-01-03 ENCOUNTER — Ambulatory Visit (INDEPENDENT_AMBULATORY_CARE_PROVIDER_SITE_OTHER): Payer: Medicaid Other | Admitting: *Deleted

## 2014-01-03 VITALS — BP 134/75 | HR 87

## 2014-01-03 DIAGNOSIS — O09523 Supervision of elderly multigravida, third trimester: Secondary | ICD-10-CM

## 2014-01-03 DIAGNOSIS — O09529 Supervision of elderly multigravida, unspecified trimester: Secondary | ICD-10-CM

## 2014-01-03 NOTE — Telephone Encounter (Signed)
S/w pt gave appt 8/20 for labs prior to md visit.

## 2014-01-03 NOTE — Progress Notes (Signed)
Pt states she has a H/A since yesterday - has not taken any medication.

## 2014-01-05 NOTE — H&P (Signed)
[name] is an 43 y.o. [id number] [redacted]w[redacted]d female.   Chief Complaint: Anemia HPI: Known sickle cell disease. For transfusion  Past Medical History  Diagnosis Date  . Sickle cell disease   . Anemia 03/30/2012    Hx of sickle cell disease  . Leg ulcer 10/27/2012    Chronic under care of wound clinic  . Cholelithiasis y-1  . H/O miscarriage, currently pregnant   . CAP (community acquired pneumonia)     2014  . Anemia complicating pregnancy 3/41/9622    Past Surgical History  Procedure Laterality Date  . Cesarean section      Family History  Problem Relation Age of Onset  . Sickle cell anemia Sister   . Diabetes Mother    Social History:  reports that she has never smoked. She has never used smokeless tobacco. She reports that she does not drink alcohol or use illicit drugs.  Allergies: No Known Allergies  No current facility-administered medications on file prior to encounter.   Current Outpatient Prescriptions on File Prior to Encounter  Medication Sig Dispense Refill  . acetaminophen (TYLENOL) 325 MG tablet Take 650 mg by mouth every 6 (six) hours as needed for mild pain or headache.       . calcium carbonate (TUMS) 500 MG chewable tablet Chew 2 tablets (400 mg of elemental calcium total) by mouth 2 (two) times daily.  297 tablet  3  . folic acid (FOLVITE) 1 MG tablet Take 5 tablets (5 mg total) by mouth daily. Please dispense higher strengths if available  300 tablet  3  . Prenatal Vit-Fe Fumarate-FA (PRENATAL PO) Take 1 tablet by mouth daily.      . ranitidine (ZANTAC) 150 MG tablet Take 1 tablet (150 mg total) by mouth 2 (two) times daily.  60 tablet  3    Pertinent items are noted in HPI.  Blood pressure 135/77, pulse 62, temperature 98.6 F (37 C), resp. rate 20, height 5\' 7"  (1.702 m), weight 141 lb 11.2 oz (64.275 kg), last menstrual period 04/28/2013, SpO2 94.00%. BP 135/77  Pulse 62  Temp(Src) 98.6 F (37 C)  Resp 20  Ht 5\' 7"  (1.702 m)  Wt 141 lb 11.2 oz  (64.275 kg)  BMI 22.19 kg/m2  SpO2 94%  LMP 04/28/2013 General appearance: alert, cooperative and appears stated age Head: Normocephalic, without obvious abnormality, atraumatic Neck: supple, symmetrical, trachea midline Heart: regular rate and rhythm Abdomen: gravid, NT Extremities: extremities normal, atraumatic, no cyanosis or edema Skin: Skin color, texture, turgor normal. No rashes or lesions   Lab Results  Component Value Date   WBC 13.1* 12/30/2013   HGB 8.7* 12/30/2013   HCT 24.7* 12/30/2013   MCV 85.0 12/30/2013   PLT 358 12/30/2013   Lab Results  Component Value Date   PREGTESTUR POSITIVE* 07/15/2013     Assessment/Plan Patient Active Problem List   Diagnosis Date Noted  . High risk pregnancy due to history of previous obstetrical problem 07/15/2013    Priority: High  . Hx of preeclampsia, prior pregnancy, currently pregnant 07/22/2013    Priority: Medium  . AMA--age > 40 07/22/2013    Priority: Medium  . Previous cesarean delivery, antepartum condition or complication 98/92/1194    Priority: Medium  . History of preterm delivery, currently pregnant 07/22/2013    Priority: Medium  . Sickle cell anemia affecting pregnancy, antepartum 07/15/2013    Priority: Medium  . Anemia complicating pregnancy 17/40/8144  . 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 Leukocytosis,  unspecified 10/21/2013  . 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 Hemochromatosis 11/10/2012  . Chronic pain syndrome 10/13/2012  . Sinus bradycardia by electrocardiogram 04/03/2012  . Chronic wound of extremity 04/01/2012  . Elevated LFTs 03/30/2012  . Cholelithiasis 03/30/2012  . Leukocytosis 03/30/2012   For transfusion per sickle cell MD.  Darron Doom S 01/05/2014, 4:19 PM

## 2014-01-06 ENCOUNTER — Other Ambulatory Visit: Payer: Self-pay | Admitting: Obstetrics & Gynecology

## 2014-01-06 ENCOUNTER — Encounter (HOSPITAL_COMMUNITY)
Admission: AD | Disposition: A | Discharge status: Home / Self Care | Payer: Self-pay | Source: Ambulatory Visit | Attending: Family Medicine

## 2014-01-06 ENCOUNTER — Encounter: Payer: Self-pay | Admitting: Hematology and Oncology

## 2014-01-06 ENCOUNTER — Inpatient Hospital Stay (HOSPITAL_COMMUNITY)
Admission: AD | Admit: 2014-01-06 | Discharge: 2014-01-09 | DRG: 765 | Disposition: A | Discharge status: Home / Self Care | Payer: Medicaid Other | Source: Ambulatory Visit | Attending: Family Medicine | Admitting: Family Medicine

## 2014-01-06 ENCOUNTER — Ambulatory Visit (HOSPITAL_BASED_OUTPATIENT_CLINIC_OR_DEPARTMENT_OTHER): Payer: Medicaid Other | Admitting: Hematology and Oncology

## 2014-01-06 ENCOUNTER — Other Ambulatory Visit (HOSPITAL_BASED_OUTPATIENT_CLINIC_OR_DEPARTMENT_OTHER): Payer: Medicaid Other

## 2014-01-06 ENCOUNTER — Encounter (HOSPITAL_COMMUNITY): Payer: Self-pay

## 2014-01-06 ENCOUNTER — Telehealth: Payer: Self-pay | Admitting: Hematology and Oncology

## 2014-01-06 ENCOUNTER — Encounter (HOSPITAL_COMMUNITY): Payer: Self-pay | Admitting: *Deleted

## 2014-01-06 ENCOUNTER — Ambulatory Visit (HOSPITAL_COMMUNITY)
Admission: RE | Admit: 2014-01-06 | Discharge: 2014-01-06 | Disposition: A | Discharge status: Home / Self Care | Payer: Medicaid Other | Source: Ambulatory Visit | Attending: Family Medicine | Admitting: Family Medicine

## 2014-01-06 ENCOUNTER — Ambulatory Visit (INDEPENDENT_AMBULATORY_CARE_PROVIDER_SITE_OTHER): Payer: Medicaid Other | Admitting: Family Medicine

## 2014-01-06 ENCOUNTER — Encounter (HOSPITAL_COMMUNITY): Payer: Medicaid Other | Admitting: Anesthesiology

## 2014-01-06 ENCOUNTER — Inpatient Hospital Stay (HOSPITAL_COMMUNITY): Payer: Medicaid Other | Admitting: Anesthesiology

## 2014-01-06 VITALS — BP 136/83 | HR 76 | Wt 150.0 lb

## 2014-01-06 VITALS — BP 137/82 | HR 90 | Temp 98.4°F | Resp 19 | Ht 67.0 in | Wt 150.2 lb

## 2014-01-06 VITALS — BP 129/83 | HR 85 | Temp 98.2°F | Wt 149.8 lb

## 2014-01-06 DIAGNOSIS — O9902 Anemia complicating childbirth: Secondary | ICD-10-CM | POA: Diagnosis present

## 2014-01-06 DIAGNOSIS — O99019 Anemia complicating pregnancy, unspecified trimester: Secondary | ICD-10-CM

## 2014-01-06 DIAGNOSIS — O09523 Supervision of elderly multigravida, third trimester: Secondary | ICD-10-CM

## 2014-01-06 DIAGNOSIS — O365990 Maternal care for other known or suspected poor fetal growth, unspecified trimester, not applicable or unspecified: Secondary | ICD-10-CM | POA: Diagnosis present

## 2014-01-06 DIAGNOSIS — O4100X0 Oligohydramnios, unspecified trimester, not applicable or unspecified: Secondary | ICD-10-CM | POA: Diagnosis present

## 2014-01-06 DIAGNOSIS — O09293 Supervision of pregnancy with other poor reproductive or obstetric history, third trimester: Secondary | ICD-10-CM

## 2014-01-06 DIAGNOSIS — D649 Anemia, unspecified: Secondary | ICD-10-CM

## 2014-01-06 DIAGNOSIS — Z833 Family history of diabetes mellitus: Secondary | ICD-10-CM

## 2014-01-06 DIAGNOSIS — D571 Sickle-cell disease 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: Secondary | ICD-10-CM

## 2014-01-06 DIAGNOSIS — O09529 Supervision of elderly multigravida, unspecified trimester: Secondary | ICD-10-CM | POA: Diagnosis present

## 2014-01-06 DIAGNOSIS — O365931 Maternal care for other known or suspected poor fetal growth, third trimester, fetus 1: Secondary | ICD-10-CM

## 2014-01-06 DIAGNOSIS — Z8751 Personal history of pre-term labor: Secondary | ICD-10-CM

## 2014-01-06 DIAGNOSIS — O34219 Maternal care for unspecified type scar from previous cesarean delivery: Principal | ICD-10-CM | POA: Diagnosis present

## 2014-01-06 DIAGNOSIS — O3421 Maternal care for scar from previous cesarean delivery: Secondary | ICD-10-CM

## 2014-01-06 DIAGNOSIS — IMO0002 Reserved for concepts with insufficient information to code with codable children: Secondary | ICD-10-CM | POA: Diagnosis present

## 2014-01-06 DIAGNOSIS — K8020 Calculus of gallbladder without cholecystitis without obstruction: Secondary | ICD-10-CM

## 2014-01-06 DIAGNOSIS — O99013 Anemia complicating pregnancy, third trimester: Secondary | ICD-10-CM

## 2014-01-06 DIAGNOSIS — Z3490 Encounter for supervision of normal pregnancy, unspecified, unspecified trimester: Secondary | ICD-10-CM

## 2014-01-06 DIAGNOSIS — D72829 Elevated white blood cell count, unspecified: Secondary | ICD-10-CM

## 2014-01-06 DIAGNOSIS — O9989 Other specified diseases and conditions complicating pregnancy, childbirth and the puerperium: Secondary | ICD-10-CM

## 2014-01-06 DIAGNOSIS — O289 Unspecified abnormal findings on antenatal screening of mother: Secondary | ICD-10-CM

## 2014-01-06 DIAGNOSIS — O09299 Supervision of pregnancy with other poor reproductive or obstetric history, unspecified trimester: Secondary | ICD-10-CM

## 2014-01-06 LAB — CBC & DIFF AND RETIC
BASO%: 1 % (ref 0.0–2.0)
BASOS ABS: 0.2 10*3/uL — AB (ref 0.0–0.1)
EOS%: 0.9 % (ref 0.0–7.0)
Eosinophils Absolute: 0.2 10*3/uL (ref 0.0–0.5)
HEMATOCRIT: 26 % — AB (ref 34.8–46.6)
HEMOGLOBIN: 9.2 g/dL — AB (ref 11.6–15.9)
LYMPH%: 10.3 % — ABNORMAL LOW (ref 14.0–49.7)
MCH: 30.1 pg (ref 25.1–34.0)
MCHC: 35.2 g/dL (ref 31.5–36.0)
MCV: 85.4 fL (ref 79.5–101.0)
MONO#: 2 10*3/uL — ABNORMAL HIGH (ref 0.1–0.9)
MONO%: 12.1 % (ref 0.0–14.0)
NEUT%: 75.7 % (ref 38.4–76.8)
NEUTROS ABS: 12.5 10*3/uL — AB (ref 1.5–6.5)
PLATELETS: 361 10*3/uL (ref 145–400)
RBC: 3.04 10*6/uL — ABNORMAL LOW (ref 3.70–5.45)
RDW: 22.1 % — ABNORMAL HIGH (ref 11.2–14.5)
WBC: 16.5 10*3/uL — ABNORMAL HIGH (ref 3.9–10.3)
lymph#: 1.7 10*3/uL (ref 0.9–3.3)
nRBC: 48 % — ABNORMAL HIGH (ref 0–0)

## 2014-01-06 LAB — POCT URINALYSIS DIP (DEVICE)
GLUCOSE, UA: NEGATIVE mg/dL
Ketones, ur: NEGATIVE mg/dL
Leukocytes, UA: NEGATIVE
Nitrite: NEGATIVE
PH: 7 (ref 5.0–8.0)
Protein, ur: >=300 mg/dL — AB
Specific Gravity, Urine: 1.01 (ref 1.005–1.030)
UROBILINOGEN UA: 2 mg/dL — AB (ref 0.0–1.0)

## 2014-01-06 LAB — CBC
HCT: 25.2 % — ABNORMAL LOW (ref 36.0–46.0)
HCT: 23.6 % — ABNORMAL LOW (ref 36.0–46.0)
Hemoglobin: 8.8 g/dL — ABNORMAL LOW (ref 12.0–15.0)
Hemoglobin: 8.5 g/dL — ABNORMAL LOW (ref 12.0–15.0)
MCH: 29.5 pg (ref 26.0–34.0)
MCH: 29.6 pg (ref 26.0–34.0)
MCHC: 34.9 g/dL (ref 30.0–36.0)
MCHC: 36 g/dL (ref 30.0–36.0)
MCV: 84.6 fL (ref 78.0–100.0)
MCV: 82.2 fL (ref 78.0–100.0)
PLATELETS: 358 10*3/uL (ref 150–400)
PLATELETS: 359 10*3/uL (ref 150–400)
RBC: 2.98 MIL/uL — ABNORMAL LOW (ref 3.87–5.11)
RBC: 2.87 MIL/uL — ABNORMAL LOW (ref 3.87–5.11)
RDW: 22.7 % — AB (ref 11.5–15.5)
RDW: 23.1 % — AB (ref 11.5–15.5)
WBC: 13.3 10*3/uL — AB (ref 4.0–10.5)
WBC: 14 10*3/uL — ABNORMAL HIGH (ref 4.0–10.5)

## 2014-01-06 LAB — COMPREHENSIVE METABOLIC PANEL
ALK PHOS: 1656 U/L — AB (ref 39–117)
ALT: 21 U/L (ref 0–35)
AST: 65 U/L — ABNORMAL HIGH (ref 0–37)
Albumin: 2.6 g/dL — ABNORMAL LOW (ref 3.5–5.2)
Anion gap: 11 (ref 5–15)
BUN: 6 mg/dL (ref 6–23)
CO2: 22 meq/L (ref 19–32)
Calcium: 8.5 mg/dL (ref 8.4–10.5)
Chloride: 104 mEq/L (ref 96–112)
Creatinine, Ser: 0.4 mg/dL — ABNORMAL LOW (ref 0.50–1.10)
GFR calc Af Amer: >90 mL/min (ref >90)
GLUCOSE: 84 mg/dL (ref 70–99)
POTASSIUM: 4.3 meq/L (ref 3.7–5.3)
SODIUM: 137 meq/L (ref 137–147)
TOTAL PROTEIN: 6.7 g/dL (ref 6.0–8.3)
Total Bilirubin: 2 mg/dL — ABNORMAL HIGH (ref 0.3–1.2)

## 2014-01-06 LAB — RETICULOCYTES (CHCC)
ABS Retic: 588.7 10*3/uL — ABNORMAL HIGH (ref 19.0–186.0)
RBC.: 3.05 MIL/uL — ABNORMAL LOW (ref 3.87–5.11)
RETIC CT PCT: 19.3 % — AB (ref 0.4–2.3)

## 2014-01-06 LAB — PROTEIN / CREATININE RATIO, URINE
Creatinine, Urine: 24.48 mg/dL
Protein Creatinine Ratio: 4.2 — ABNORMAL HIGH (ref 0.00–0.15)
Total Protein, Urine: 102.7 mg/dL

## 2014-01-06 LAB — HOLD TUBE, BLOOD BANK

## 2014-01-06 LAB — TECHNOLOGIST REVIEW

## 2014-01-06 SURGERY — Surgical Case
Anesthesia: Spinal

## 2014-01-06 MED ORDER — DIPHENHYDRAMINE HCL 25 MG PO CAPS
25.0000 mg | ORAL_CAPSULE | ORAL | Status: DC | PRN
Start: 2014-01-06 — End: 2014-01-09
  Administered 2014-01-07 (×2): 25 mg via ORAL
  Filled 2014-01-06: qty 1

## 2014-01-06 MED ORDER — ACETAMINOPHEN 325 MG PO TABS
650.0000 mg | ORAL_TABLET | ORAL | Status: DC | PRN
  Administered 2014-01-06: 650 mg via ORAL
  Filled 2014-01-06: qty 2

## 2014-01-06 MED ORDER — ACETAMINOPHEN 500 MG PO TABS
1000.0000 mg | ORAL_TABLET | Freq: Four times a day (QID) | ORAL | Status: AC
  Administered 2014-01-07 (×4): 1000 mg via ORAL
  Filled 2014-01-06 (×4): qty 2

## 2014-01-06 MED ORDER — METOCLOPRAMIDE HCL 5 MG/ML IJ SOLN: 10.0000 mg | Freq: Three times a day (TID) | INTRAMUSCULAR | Status: DC | PRN

## 2014-01-06 MED ORDER — MEPERIDINE HCL 25 MG/ML IJ SOLN
INTRAMUSCULAR | Status: AC
  Filled 2014-01-06: qty 1

## 2014-01-06 MED ORDER — ONDANSETRON HCL 4 MG/2ML IJ SOLN
INTRAMUSCULAR | Status: DC | PRN
  Administered 2014-01-06: 4 mg via INTRAVENOUS

## 2014-01-06 MED ORDER — PHENYLEPHRINE 8 MG IN D5W 100 ML (0.08MG/ML) PREMIX OPTIME
INJECTION | INTRAVENOUS | Status: AC
  Filled 2014-01-06: qty 100

## 2014-01-06 MED ORDER — ONDANSETRON HCL 4 MG/2ML IJ SOLN
INTRAMUSCULAR | Status: AC
  Filled 2014-01-06: qty 2

## 2014-01-06 MED ORDER — LACTATED RINGERS IV SOLN
INTRAVENOUS | Status: DC
  Administered 2014-01-07: 04:00:00 via INTRAVENOUS

## 2014-01-06 MED ORDER — SIMETHICONE 80 MG PO CHEW
80.0000 mg | CHEWABLE_TABLET | Freq: Three times a day (TID) | ORAL | Status: DC
  Administered 2014-01-07 – 2014-01-09 (×7): 80 mg via ORAL
  Filled 2014-01-06 (×6): qty 1

## 2014-01-06 MED ORDER — SODIUM CHLORIDE 0.9 % IJ SOLN
3.0000 mL | INTRAMUSCULAR | Status: DC | PRN
Start: 2014-01-06 — End: 2014-01-09

## 2014-01-06 MED ORDER — MEPERIDINE HCL 25 MG/ML IJ SOLN
INTRAMUSCULAR | Status: DC | PRN
  Administered 2014-01-06: 25 mg via INTRAVENOUS

## 2014-01-06 MED ORDER — LACTATED RINGERS IV SOLN
500.0000 mL | INTRAVENOUS | Status: DC | PRN
  Administered 2014-01-06: 500 mL via INTRAVENOUS

## 2014-01-06 MED ORDER — TERBUTALINE SULFATE 1 MG/ML IJ SOLN: 0.2500 mg | Freq: Once | INTRAMUSCULAR | Status: DC | PRN

## 2014-01-06 MED ORDER — WITCH HAZEL-GLYCERIN EX PADS: 1.0000 "application " | MEDICATED_PAD | CUTANEOUS | Status: DC | PRN

## 2014-01-06 MED ORDER — SIMETHICONE 80 MG PO CHEW: 80.0000 mg | CHEWABLE_TABLET | ORAL | Status: DC | PRN

## 2014-01-06 MED ORDER — BUPIVACAINE HCL (PF) 0.5 % IJ SOLN
INTRAMUSCULAR | Status: DC | PRN
  Administered 2014-01-06: 30 mL

## 2014-01-06 MED ORDER — MIDAZOLAM HCL 2 MG/2ML IJ SOLN: 0.5000 mg | Freq: Once | INTRAMUSCULAR | Status: DC | PRN

## 2014-01-06 MED ORDER — CITRIC ACID-SODIUM CITRATE 334-500 MG/5ML PO SOLN
30.0000 mL | ORAL | Status: DC | PRN
  Administered 2014-01-06: 30 mL via ORAL
  Filled 2014-01-06: qty 15

## 2014-01-06 MED ORDER — LACTATED RINGERS IV SOLN
INTRAVENOUS | Status: DC | PRN
  Administered 2014-01-06: 20:00:00 via INTRAVENOUS

## 2014-01-06 MED ORDER — IBUPROFEN 600 MG PO TABS
600.0000 mg | ORAL_TABLET | Freq: Four times a day (QID) | ORAL | Status: DC
Start: 2014-01-07 — End: 2014-01-09
  Administered 2014-01-07 – 2014-01-09 (×9): 600 mg via ORAL
  Filled 2014-01-06 (×9): qty 1

## 2014-01-06 MED ORDER — NALOXONE HCL 0.4 MG/ML IJ SOLN: 0.4000 mg | INTRAMUSCULAR | Status: DC | PRN

## 2014-01-06 MED ORDER — DIPHENHYDRAMINE HCL 50 MG/ML IJ SOLN: 12.5000 mg | INTRAMUSCULAR | Status: DC | PRN

## 2014-01-06 MED ORDER — LANOLIN HYDROUS EX OINT: 1.0000 "application " | TOPICAL_OINTMENT | CUTANEOUS | Status: DC | PRN

## 2014-01-06 MED ORDER — ONDANSETRON HCL 4 MG/2ML IJ SOLN: 4.0000 mg | Freq: Four times a day (QID) | INTRAMUSCULAR | Status: DC | PRN

## 2014-01-06 MED ORDER — CEFAZOLIN SODIUM-DEXTROSE 2-3 GM-% IV SOLR: 2.0000 g | INTRAVENOUS | Status: DC

## 2014-01-06 MED ORDER — 0.9 % SODIUM CHLORIDE (POUR BTL) OPTIME
TOPICAL | Status: DC | PRN
  Administered 2014-01-06: 500 mL

## 2014-01-06 MED ORDER — FENTANYL CITRATE 0.05 MG/ML IJ SOLN
INTRAMUSCULAR | Status: DC | PRN
  Administered 2014-01-06: 25 ug via INTRATHECAL
  Administered 2014-01-06: 75 ug via INTRAVENOUS

## 2014-01-06 MED ORDER — ONDANSETRON HCL 4 MG/2ML IJ SOLN: 4.0000 mg | INTRAMUSCULAR | Status: DC | PRN

## 2014-01-06 MED ORDER — DIBUCAINE 1 % RE OINT: 1.0000 "application " | TOPICAL_OINTMENT | RECTAL | Status: DC | PRN

## 2014-01-06 MED ORDER — MORPHINE SULFATE (PF) 0.5 MG/ML IJ SOLN
INTRAMUSCULAR | Status: DC | PRN
  Administered 2014-01-06: .15 mg via INTRATHECAL

## 2014-01-06 MED ORDER — BUPIVACAINE HCL (PF) 0.5 % IJ SOLN
INTRAMUSCULAR | Status: AC
  Filled 2014-01-06: qty 30

## 2014-01-06 MED ORDER — OXYTOCIN 40 UNITS IN LACTATED RINGERS INFUSION - SIMPLE MED: 62.5000 mL/h | INTRAVENOUS | Status: AC

## 2014-01-06 MED ORDER — ZOLPIDEM TARTRATE 5 MG PO TABS: 5.0000 mg | ORAL_TABLET | Freq: Every evening | ORAL | Status: DC | PRN

## 2014-01-06 MED ORDER — MEPERIDINE HCL 25 MG/ML IJ SOLN
6.2500 mg | INTRAMUSCULAR | Status: DC | PRN
Start: 2014-01-06 — End: 2014-01-06

## 2014-01-06 MED ORDER — ONDANSETRON HCL 4 MG/2ML IJ SOLN
4.0000 mg | Freq: Three times a day (TID) | INTRAMUSCULAR | Status: DC | PRN
Start: 2014-01-06 — End: 2014-01-09

## 2014-01-06 MED ORDER — OXYTOCIN 10 UNIT/ML IJ SOLN
INTRAMUSCULAR | Status: AC
  Filled 2014-01-06: qty 4

## 2014-01-06 MED ORDER — SCOPOLAMINE 1 MG/3DAYS TD PT72
MEDICATED_PATCH | TRANSDERMAL | Status: AC
  Filled 2014-01-06: qty 1

## 2014-01-06 MED ORDER — KETOROLAC TROMETHAMINE 30 MG/ML IJ SOLN: 30.0000 mg | Freq: Four times a day (QID) | INTRAMUSCULAR | Status: AC | PRN

## 2014-01-06 MED ORDER — PHENYLEPHRINE 8 MG IN D5W 100 ML (0.08MG/ML) PREMIX OPTIME
INJECTION | INTRAVENOUS | Status: DC | PRN
  Administered 2014-01-06: 60 ug/min via INTRAVENOUS

## 2014-01-06 MED ORDER — OXYTOCIN 40 UNITS IN LACTATED RINGERS INFUSION - SIMPLE MED: 62.5000 mL/h | INTRAVENOUS | Status: DC

## 2014-01-06 MED ORDER — OXYTOCIN BOLUS FROM INFUSION: 500.0000 mL | INTRAVENOUS | Status: DC

## 2014-01-06 MED ORDER — DIPHENHYDRAMINE HCL 25 MG PO CAPS
25.0000 mg | ORAL_CAPSULE | Freq: Four times a day (QID) | ORAL | Status: DC | PRN
  Filled 2014-01-06: qty 1

## 2014-01-06 MED ORDER — MEPERIDINE HCL 25 MG/ML IJ SOLN: 6.2500 mg | INTRAMUSCULAR | Status: DC | PRN

## 2014-01-06 MED ORDER — KETOROLAC TROMETHAMINE 30 MG/ML IJ SOLN
INTRAMUSCULAR | Status: AC
  Filled 2014-01-06: qty 1

## 2014-01-06 MED ORDER — FENTANYL CITRATE 0.05 MG/ML IJ SOLN
INTRAMUSCULAR | Status: AC
  Filled 2014-01-06: qty 2

## 2014-01-06 MED ORDER — MENTHOL 3 MG MT LOZG
1.0000 | LOZENGE | OROMUCOSAL | Status: DC | PRN
Start: 2014-01-06 — End: 2014-01-09

## 2014-01-06 MED ORDER — MISOPROSTOL 25 MCG QUARTER TABLET
25.0000 ug | ORAL_TABLET | ORAL | Status: DC | PRN
  Administered 2014-01-06: 25 ug via VAGINAL
  Filled 2014-01-06: qty 0.25

## 2014-01-06 MED ORDER — DIPHENHYDRAMINE HCL 50 MG/ML IJ SOLN: 25.0000 mg | INTRAMUSCULAR | Status: DC | PRN

## 2014-01-06 MED ORDER — PRENATAL MULTIVITAMIN CH
1.0000 | ORAL_TABLET | Freq: Every day | ORAL | Status: DC
  Administered 2014-01-07 – 2014-01-09 (×3): 1 via ORAL
  Filled 2014-01-06 (×3): qty 1

## 2014-01-06 MED ORDER — KETOROLAC TROMETHAMINE 30 MG/ML IJ SOLN
30.0000 mg | Freq: Four times a day (QID) | INTRAMUSCULAR | Status: AC | PRN
Start: 2014-01-06 — End: 2014-01-07
  Administered 2014-01-06: 30 mg via INTRAVENOUS

## 2014-01-06 MED ORDER — OXYCODONE-ACETAMINOPHEN 5-325 MG PO TABS: 1.0000 | ORAL_TABLET | ORAL | Status: DC | PRN

## 2014-01-06 MED ORDER — OXYCODONE-ACETAMINOPHEN 5-325 MG PO TABS
1.0000 | ORAL_TABLET | ORAL | Status: DC | PRN
  Administered 2014-01-07 – 2014-01-08 (×2): 1 via ORAL
  Administered 2014-01-08 – 2014-01-09 (×2): 2 via ORAL
  Filled 2014-01-06: qty 1
  Filled 2014-01-06 (×2): qty 2
  Filled 2014-01-06: qty 1

## 2014-01-06 MED ORDER — MORPHINE SULFATE 0.5 MG/ML IJ SOLN
INTRAMUSCULAR | Status: AC
  Filled 2014-01-06: qty 10

## 2014-01-06 MED ORDER — IBUPROFEN 600 MG PO TABS
600.0000 mg | ORAL_TABLET | Freq: Four times a day (QID) | ORAL | Status: DC | PRN
Start: 2014-01-06 — End: 2014-01-06

## 2014-01-06 MED ORDER — SENNOSIDES-DOCUSATE SODIUM 8.6-50 MG PO TABS
2.0000 | ORAL_TABLET | ORAL | Status: DC
  Administered 2014-01-07 – 2014-01-08 (×3): 2 via ORAL
  Filled 2014-01-06 (×3): qty 2

## 2014-01-06 MED ORDER — NALBUPHINE HCL 10 MG/ML IJ SOLN: 5.0000 mg | INTRAMUSCULAR | Status: DC | PRN

## 2014-01-06 MED ORDER — LACTATED RINGERS IV SOLN
INTRAVENOUS | Status: DC | PRN
  Administered 2014-01-06: 21:00:00 via INTRAVENOUS

## 2014-01-06 MED ORDER — SCOPOLAMINE 1 MG/3DAYS TD PT72
1.0000 | MEDICATED_PATCH | Freq: Once | TRANSDERMAL | Status: DC
Start: 2014-01-06 — End: 2014-01-09
  Administered 2014-01-06: 1.5 mg via TRANSDERMAL

## 2014-01-06 MED ORDER — OXYTOCIN 10 UNIT/ML IJ SOLN
40.0000 [IU] | INTRAVENOUS | Status: DC | PRN
  Administered 2014-01-06: 40 [IU] via INTRAVENOUS

## 2014-01-06 MED ORDER — BUPIVACAINE IN DEXTROSE 0.75-8.25 % IT SOLN
INTRATHECAL | Status: DC | PRN
  Administered 2014-01-06: 1.6 mL via INTRATHECAL

## 2014-01-06 MED ORDER — LIDOCAINE HCL (PF) 1 % IJ SOLN
30.0000 mL | INTRAMUSCULAR | Status: DC | PRN
Start: 2014-01-06 — End: 2014-01-06

## 2014-01-06 MED ORDER — TETANUS-DIPHTH-ACELL PERTUSSIS 5-2.5-18.5 LF-MCG/0.5 IM SUSP: 0.5000 mL | Freq: Once | INTRAMUSCULAR | Status: DC

## 2014-01-06 MED ORDER — ONDANSETRON HCL 4 MG PO TABS: 4.0000 mg | ORAL_TABLET | ORAL | Status: DC | PRN

## 2014-01-06 MED ORDER — NALOXONE HCL 1 MG/ML IJ SOLN
1.0000 ug/kg/h | INTRAVENOUS | Status: DC | PRN
  Filled 2014-01-06: qty 2

## 2014-01-06 MED ORDER — FENTANYL CITRATE 0.05 MG/ML IJ SOLN: 25.0000 ug | INTRAMUSCULAR | Status: DC | PRN

## 2014-01-06 MED ORDER — LACTATED RINGERS IV SOLN: INTRAVENOUS | Status: DC

## 2014-01-06 MED ORDER — PROMETHAZINE HCL 25 MG/ML IJ SOLN
6.2500 mg | INTRAMUSCULAR | Status: DC | PRN
Start: 2014-01-06 — End: 2014-01-06

## 2014-01-06 MED ORDER — SIMETHICONE 80 MG PO CHEW
80.0000 mg | CHEWABLE_TABLET | ORAL | Status: DC
  Administered 2014-01-07 – 2014-01-08 (×3): 80 mg via ORAL
  Filled 2014-01-06 (×3): qty 1

## 2014-01-06 MED ORDER — IBUPROFEN 600 MG PO TABS: 600.0000 mg | ORAL_TABLET | Freq: Four times a day (QID) | ORAL | Status: DC | PRN

## 2014-01-06 SURGICAL SUPPLY — 40 items
BARRIER ADHS 3X4 INTERCEED (GAUZE/BANDAGES/DRESSINGS) IMPLANT
BLADE SURG 10 STRL SS (BLADE) ×6 IMPLANT
CLAMP CORD UMBIL (MISCELLANEOUS) IMPLANT
CLOSURE WOUND 1/2 X4 (GAUZE/BANDAGES/DRESSINGS) ×1
CLOTH BEACON ORANGE TIMEOUT ST (SAFETY) ×3 IMPLANT
CONTAINER PREFILL 10% NBF 15ML (MISCELLANEOUS) IMPLANT
DRAPE LG THREE QUARTER DISP (DRAPES) IMPLANT
DRSG OPSITE POSTOP 4X10 (GAUZE/BANDAGES/DRESSINGS) ×3 IMPLANT
DURAPREP 26ML APPLICATOR (WOUND CARE) ×3 IMPLANT
ELECT REM PT RETURN 9FT ADLT (ELECTROSURGICAL) ×3
ELECTRODE REM PT RTRN 9FT ADLT (ELECTROSURGICAL) ×1 IMPLANT
EXTRACTOR VACUUM KIWI (MISCELLANEOUS) IMPLANT
GLOVE BIO SURGEON STRL SZ 6.5 (GLOVE) ×2 IMPLANT
GLOVE BIO SURGEONS STRL SZ 6.5 (GLOVE) ×1
GOWN STRL REUS W/TWL LRG LVL3 (GOWN DISPOSABLE) ×6 IMPLANT
KIT ABG SYR 3ML LUER SLIP (SYRINGE) IMPLANT
NEEDLE HYPO 25X5/8 SAFETYGLIDE (NEEDLE) IMPLANT
NEEDLE SPNL 18GX3.5 QUINCKE PK (NEEDLE) ×3 IMPLANT
NS IRRIG 1000ML POUR BTL (IV SOLUTION) ×3 IMPLANT
PACK C SECTION WH (CUSTOM PROCEDURE TRAY) ×3 IMPLANT
PAD ABD 7.5X8 STRL (GAUZE/BANDAGES/DRESSINGS) ×3 IMPLANT
PAD OB MATERNITY 4.3X12.25 (PERSONAL CARE ITEMS) ×3 IMPLANT
SPONGE GAUZE 4X4 12PLY STER LF (GAUZE/BANDAGES/DRESSINGS) ×3 IMPLANT
STRIP CLOSURE SKIN 1/2X4 (GAUZE/BANDAGES/DRESSINGS) ×2 IMPLANT
SUT PDS AB 0 CTX 60 (SUTURE) ×3 IMPLANT
SUT VIC AB 0 CT1 27 (SUTURE) ×4
SUT VIC AB 0 CT1 27XBRD ANBCTR (SUTURE) ×2 IMPLANT
SUT VIC AB 0 CT1 36 (SUTURE) IMPLANT
SUT VIC AB 2-0 CT1 27 (SUTURE) ×2
SUT VIC AB 2-0 CT1 TAPERPNT 27 (SUTURE) ×1 IMPLANT
SUT VIC AB 2-0 CTX 36 (SUTURE) ×6 IMPLANT
SUT VIC AB 3-0 CT1 27 (SUTURE) ×2
SUT VIC AB 3-0 CT1 TAPERPNT 27 (SUTURE) ×1 IMPLANT
SUT VIC AB 3-0 SH 27 (SUTURE)
SUT VIC AB 3-0 SH 27X BRD (SUTURE) IMPLANT
SYR 30ML LL (SYRINGE) ×3 IMPLANT
TAPE CLOTH SURG 4X10 WHT LF (GAUZE/BANDAGES/DRESSINGS) ×3 IMPLANT
TOWEL OR 17X24 6PK STRL BLUE (TOWEL DISPOSABLE) ×3 IMPLANT
TRAY FOLEY CATH 14FR (SET/KITS/TRAYS/PACK) ×3 IMPLANT
WATER STERILE IRR 1000ML POUR (IV SOLUTION) ×3 IMPLANT

## 2014-01-06 NOTE — Transfer of Care (Signed)
Immediate Anesthesia Transfer of Care Note  Patient: [name]  Procedure(s) Performed: Procedure(s): CESAREAN SECTION (N/A)  Patient Location: PACU  Anesthesia Type:Spinal  Level of Consciousness: awake, alert  and oriented  Airway & Oxygen Therapy: Patient Spontanous Breathing  Post-op Assessment: Report given to PACU RN and Post -op Vital signs reviewed and stable  Post vital signs: Reviewed and stable  Complications: No apparent anesthesia complications

## 2014-01-06 NOTE — Anesthesia Preprocedure Evaluation (Addendum)
Anesthesia Evaluation  Patient identified by MRN, date of birth, ID band Patient awake    Reviewed: Allergy & Precautions, H&P , NPO status , Patient's Chart, lab work & pertinent test results  Airway Mallampati: II      Dental   Pulmonary pneumonia -, resolved,  breath sounds clear to auscultation        Cardiovascular Exercise Tolerance: Good hypertension, Rhythm:regular Rate:Normal     Neuro/Psych    GI/Hepatic   Endo/Other    Renal/GU      Musculoskeletal   Abdominal   Peds  Hematology  (+) Sickle cell anemia ,   Anesthesia Other Findings   Reproductive/Obstetrics (+) Pregnancy                          Anesthesia Physical Anesthesia Plan  ASA: III and emergent  Anesthesia Plan: Spinal   Post-op Pain Management:    Induction:   Airway Management Planned:   Additional Equipment:   Intra-op Plan:   Post-operative Plan:   Informed Consent: I have reviewed the patients History and Physical, chart, labs and discussed the procedure including the risks, benefits and alternatives for the proposed anesthesia with the patient or authorized representative who has indicated his/her understanding and acceptance.     Plan Discussed with: Anesthesiologist, CRNA and Surgeon  Anesthesia Plan Comments:         Anesthesia Quick Evaluation

## 2014-01-06 NOTE — Assessment & Plan Note (Signed)
This patient has no recent acute crisis. She has robust reticulocytosis. I recommend continue high-dose folic acid supplementation. The patient will continue to return here every week with plan for blood transfusion if her hemoglobin gets close to 8 g.

## 2014-01-06 NOTE — H&P (Signed)
[name] is a 43 y.o. female [phone] at [redacted]w[redacted]d sent from MFM with recommendation for delivery indicated by fetal growth lag, oligohydramnios, high normal UA Dopplers, elevated BP, poor OB hx.    Maternal Medical History:  Reason for admission: Nausea.    OB History   Grav Para Term Preterm Abortions TAB SAB Ect Mult Living   6 3 1 2 2  2   1      Past Medical History  Diagnosis Date  . Sickle cell disease   . Anemia 03/30/2012    Hx of sickle cell disease  . Leg ulcer 10/27/2012    Chronic under care of wound clinic  . Cholelithiasis y-1  . H/O miscarriage, currently pregnant   . CAP (community acquired pneumonia)     2014  . Anemia complicating pregnancy 5/91/6384   Past Surgical History  Procedure Laterality Date  . Cesarean section     Family History: family history includes Diabetes in her mother; Sickle cell anemia in her sister. Social History:  reports that she has never smoked. She has never used smokeless tobacco. She reports that she does not drink alcohol or use illicit drugs.   Prenatal Transfer Tool  Maternal Diabetes: No Genetic Screening: Normal NIPS Maternal Ultrasounds/Referrals: Abnormal:  Findings:   Other: Fetal Ultrasounds or other Referrals:  Other: possible left ectopic kidney and dilated loop of bowel Maternal Substance Abuse:  No Significant Maternal Medications:  None Significant Maternal Lab Results:  Lab values include: Other: GBS pending from 01/04/14 Other Comments:  IUGR, oligo, calcified placenta  Review of Systems  Constitutional: Negative for fever, chills and weight loss.  HENT:       Mild H/A on admission, resolved  Eyes: Negative for blurred vision.  Cardiovascular: Negative for chest pain.  Gastrointestinal: Negative for nausea, vomiting and abdominal pain.  Neurological: Positive for headaches. Negative for dizziness.  Psychiatric/Behavioral: The patient is not nervous/anxious.    SVE: post, closed/30/-2 vtx  Dilation:  Closed Exam by:: [name] cnm Blood pressure 140/80, pulse 67, temperature 98.4 F (36.9 C), temperature source Oral, resp. rate 18, height 5\' 7"  (1.702 m), weight 68.04 kg (150 lb), last menstrual period 04/28/2013. Maternal Exam:  Uterine Assessment: Unaware of any UCs  Abdomen: Surgical scars: low transverse.   Estimated fetal weight is 4-5#.   Fetal presentation: vertex  Introitus: Normal vulva. Normal vagina.  Vagina is negative for discharge.  Ferning test: not done.  Nitrazine test: not done. Amniotic fluid character: not assessed.  Pelvis: adequate for delivery.   Cervix: Cervix evaluated by digital exam.     Fetal Exam Fetal Monitor Review: Mode: ultrasound.   Baseline rate: 125-130.  Variability: moderate (6-25 bpm).   Pattern: accelerations present and no decelerations.    Fetal State Assessment: Category I - tracings are normal.     Physical Exam  Nursing note and vitals reviewed. Constitutional: She is oriented to person, place, and time.  Thin female NAD  HENT:  Head: Normocephalic.  Eyes: Pupils are equal, round, and reactive to light.  Neck: Normal range of motion. No thyromegaly present.  Cardiovascular: Normal rate, regular rhythm and normal heart sounds.   Respiratory: Breath sounds normal.  GI: Soft. She exhibits no distension. There is no tenderness.  Genitourinary: Vagina normal and uterus normal. No vaginal discharge found.  Musculoskeletal: Normal range of motion.  Neurological: She is alert and oriented to person, place, and time.  Skin: Skin is warm and dry.  Psychiatric: She has  a normal mood and affect. Her behavior is normal. Thought content normal.    Prenatal labs: ABO, Rh: --/--/B POS (08/20 1625) Antibody: NEG (08/20 1625) Rubella: 19.90 (03/05 0931) RPR: NON REAC (06/25 1201)  HBsAg: NEGATIVE (03/05 0931)  HIV: NONREACTIVE (06/25 1201)  GBS: Negative (08/13 0000)  1 hr glucola 93  Assessment/Plan: 43 yo [id number] at [redacted]w[redacted]d  for [place]   [name] 01/06/2014, 5:43 PM  Addendum: at 1730: manually removed cytotec from vaginal vault. Dr. Hulan [name] to discuss options of TOLAC with unfavorable cx and our concern for fetal intrauterine environment or RC/S. Pt considering.

## 2014-01-06 NOTE — Assessment & Plan Note (Signed)
This is likely related to stress response and her pregnancy state. She has no signs and symptoms of infection. Continue close monitoring.

## 2014-01-06 NOTE — Telephone Encounter (Signed)
gv adn printed appt sched and avs for pt for Aug and Sept.... °

## 2014-01-06 NOTE — Assessment & Plan Note (Signed)
Her blood pressure is normal and she has no clinical signs of preeclampsia. I will add liver function tests with her future visit.

## 2014-01-06 NOTE — Assessment & Plan Note (Signed)
This patient has no recent acute crisis. She has robust reticulocytosis. I recommend continue high-dose folic acid supplementation. The patient will continue to return here every other week with plan for blood transfusion if her hemoglobin gets close to 8 g. Per guideline, I was required to arrange for blood transfusions at the [facility] so that we can benefit from fetal monitoring. I discussed this with the patient and she agreed.

## 2014-01-06 NOTE — Anesthesia Procedure Notes (Signed)
Spinal  Patient location during procedure: OR Start time: 01/06/2014 8:20 PM Staffing Anesthesiologist: Rudean Curt Performed by: anesthesiologist  Preanesthetic Checklist Completed: patient identified, site marked, surgical consent, pre-op evaluation, timeout performed, IV checked, risks and benefits discussed and monitors and equipment checked Spinal Block Patient position: sitting Prep: DuraPrep Patient monitoring: heart rate, cardiac monitor, continuous pulse ox and blood pressure Approach: midline Location: L3-4 Injection technique: single-shot Needle Needle type: Sprotte  Needle gauge: 24 G Needle length: 9 cm Assessment Sensory level: T4 Additional Notes Patient identified.  Risk benefits discussed including failed block, incomplete pain control, headache, nerve damage, paralysis, blood pressure changes, nausea, vomiting, reactions to medication both toxic or allergic, and postpartum back pain.  Patient expressed understanding and wished to proceed.  All questions were answered.  Sterile technique used throughout procedure.  CSF was clear.  No parasthesia or other complications.  Please see nursing notes for vital signs.

## 2014-01-06 NOTE — Progress Notes (Signed)
Patient is 43 y.o. [id number] [redacted]w[redacted]d.  +FM, denies LOF, VB, contractions, vaginal discharge. - NST reactive with variables noted, pt to MFM for scheduled UA dopplers ==> MFM recommended delivery, since admitted to L&D for delivery

## 2014-01-06 NOTE — Op Note (Signed)
01/06/2014  8:58 PM  PATIENT:  [name]  43 y.o. female  PRE-OPERATIVE DIAGNOSIS:  Repeat cesarean section - declied trial of labor, 36 weeks, IUGR  POST-OPERATIVE DIAGNOSIS:  same  PROCEDURE:  Procedure(s): CESAREAN SECTION (N/A)  FINDINGS: living female infant, Apgars 8&9, 3#13 ounces (baby to NICU due to its weight), intact highly calcified small placenta, normal adnexa  SURGEON:  Surgeon(s) and Role:    * Emily Filbert, MD - Primary  PHYSICIAN ASSISTANT:   ASSISTANTS: none   ANESTHESIA:   spinal  EBL:  Total I/O In: 200 [I.V.:200] Out: 700 [Urine:200; Blood:500]  BLOOD ADMINISTERED:none  DRAINS: none   LOCAL MEDICATIONS USED:  MARCAINE     SPECIMEN:  Source of Specimen:  cord blood, placenta  DISPOSITION OF SPECIMEN:  PATHOLOGY  COUNTS:  YES  TOURNIQUET:  * No tourniquets in log *  DICTATION: .Dragon Dictation  PLAN OF CARE: Admit to inpatient   PATIENT DISPOSITION:  PACU - hemodynamically stable.   Delay start of Pharmacological VTE agent (>24hrs) due to surgical blood loss or risk of bleeding: not applicable  The risks, benefits, and alternatives of surgery were explained, understood, accepted. Consents were signed. All questions were answered. In the operating room spinal anesthesia was applied without complication. Her abdomen and vagina were prepped and draped in the usual sterile fashion. A Foley catheter was placed, draining clear urine throughout case. Timeout procedure was done. After adequate anesthesia was assured 30 mL for 0.5% Marcaine was injected into the subcutaneous tissue at the site of her previous cesarean. An incision was made through the previous incision. The incision was carried down through the subcutaneous tissue to the fascia. The fascia was scored the midline and extended bilaterally. The middle 50% of the rectus muscles were separated in a transverse fashion using electrosurgical technique. Excellent hemostasis was maintained. The  peritoneum was entered with hemostats. Peritoneal incision was extended bilaterally with the Bovie. The bladder blade was placed. A transverse incision was made on the well-developed lower uterine segment. The uterine incision was extended with traction on each side. Amniotomy was performed with a hemostat. Clear fluid was noted. The baby was delivered from a vertex presentation. The mouth and nostrils were suctioned prior to delivery of the shoulders. A double nuchal cord was reduced prior to delivery of the shoulders. The baby's cord was clamped and cut and was transferred to the NICU personnel for routine care. The placenta was delivered intact with traction. The placenta appeared very calcified  The uterus was left in situ and the interior was cleaned with a dry lap sponge. The uterine incision was closed with 2-0 Vicryl running locking suture. Excellent hemostasis was noted. By tilting the uterus each side was able to visualize the adnexa, and they were normal. The rectus fascia rectus muscles were noted be hemostatic as well. The fascia was closed with a #1 PDS loop in a running nonlocking fashion. No defects were palpable. The subcutaneous tissue was irrigated, clean, and dried. A subcuticular closure was done with a 3-0 Vicryl suture. Steri-Strips are placed. Excellent cosmetic results were obtained. She was taken to the recovery room in stable condition. She tolerated the procedure well.

## 2014-01-06 NOTE — Anesthesia Postprocedure Evaluation (Signed)
  Anesthesia Post Note  Patient: [name]  Procedure(s) Performed: Procedure(s) (LRB): CESAREAN SECTION (N/A)  Anesthesia type: Epidural  Patient location: PACU  Post pain: Pain level controlled  Post assessment: Post-op Vital signs reviewed  Last Vitals:  Filed Vitals:   01/06/14 2200  BP:   Pulse: 63  Temp:   Resp: 15    Post vital signs: Reviewed  Level of consciousness: awake  Complications: No apparent anesthesia complications

## 2014-01-06 NOTE — Progress Notes (Signed)
Pt does complain of headache without relief from tylenol

## 2014-01-06 NOTE — Progress Notes (Signed)
Patient ID: [name], female   DOB: Sep 18, 1970, 43 y.o.   MRN: [id number]   I have just taken over the responsibility of the L&D from Dr. Kennon Rounds.  [name] has decided that she would prefer to have a RLTCS instead of a TOLAC. Please note that the midwife D. Poe removed as much of the vaginal cytotec (placed by the nurse) as she could. [name] declines a tubal ligation.  She understands the risks of surgery, including, but not to infection, bleeding, DVTs, damage to bowel, bladder, ureters. She wishes to proceed.

## 2014-01-06 NOTE — Progress Notes (Signed)
[facility] OFFICE PROGRESS NOTE  [name],[name], MD SUMMARY OF HEMATOLOGIC HISTORY: [name] 43 y.o. 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 According to the patient, she is pregnant again, with a due date expected on 02/02/2014. Previously, when she was pregnant with her daughter, she had severe hypertension, and intrauterine growth retardation of her daughter and her baby was delivered with C-section at 73 weeks. Her daughter is currently healthy. With her sickle cell disease, she had recurrent admission to the hospital 2-3 times a year for painful chest crisis. She was also known to have avascular necrosis of hips but never required any joint surgery. She never developed history of stroke. She had numerous transfusions in the past without complications. The patient does not take hydroxyurea when she was not pregnant. She was found to have abnormal CBC from her routine blood work recently, the last time she had exchange transfusion a few months ago for anemia of 7.9 g. She had received numerous blood transfusion to keep hemoglobin greater than 8 g during pregnancy. INTERVAL HISTORY: [name] 43 y.o. female returns for further followup. She is currently [redacted] weeks pregnant. She has regular headaches. She denies any chest pain or shortness of breath.  I have reviewed the past medical history, past surgical history, social history and family history with the patient and they are unchanged from previous note.  ALLERGIES:  has No Known Allergies.  MEDICATIONS:  Current Outpatient Prescriptions  Medication Sig Dispense Refill  . acetaminophen (TYLENOL) 325 MG tablet Take 650 mg by mouth every 6 (six) hours as needed for mild pain or headache.       Marland Kitchen aspirin 81 MG tablet Take 81 mg by mouth daily.      . calcium carbonate (TUMS) 500 MG chewable  tablet Chew 2 tablets (400 mg of elemental calcium total) by mouth 2 (two) times daily.  235 tablet  3  . folic acid (FOLVITE) 1 MG tablet Take 5 tablets (5 mg total) by mouth daily. Please dispense higher strengths if available  300 tablet  3  . oxycodone (OXY-IR) 5 MG capsule Take 5 mg by mouth every 4 (four) hours as needed.      . Prenatal Vit-Fe Fumarate-FA (PRENATAL PO) Take 1 tablet by mouth daily.      . ranitidine (ZANTAC) 150 MG tablet Take 1 tablet (150 mg total) by mouth 2 (two) times daily.  60 tablet  3   No current facility-administered medications for this visit.     REVIEW OF SYSTEMS:   Constitutional: Denies fevers, chills or night sweats Eyes: Denies blurriness of vision Ears, nose, mouth, throat, and face: Denies mucositis or sore throat Respiratory: Denies cough, dyspnea or wheezes Cardiovascular: Denies palpitation, chest discomfort or lower extremity swelling Gastrointestinal:  Denies nausea, heartburn or change in bowel habits Skin: Denies abnormal skin rashes Lymphatics: Denies new lymphadenopathy or easy bruising Neurological:Denies numbness, tingling or new weaknesses Behavioral/Psych: Mood is stable, no new changes  All other systems were reviewed with the patient and are negative.  PHYSICAL EXAMINATION: ECOG PERFORMANCE STATUS: 1 - Symptomatic but completely ambulatory  Filed Vitals:   01/06/14 0927  BP: 137/82  Pulse: 90  Temp: 98.4 F (36.9 C)  Resp: 19   Filed Weights   01/06/14 0927  Weight: 150 lb 3.2 oz (68.13 kg)    GENERAL:alert, no distress  and comfortable SKIN: skin color, texture, turgor are normal, no rashes or significant lesions EYES: normal, Conjunctiva are pale and jaundiced and non-injected, sclera clear LUNGS: clear to auscultation and percussion with normal breathing effort HEART: regular rate & rhythm with a soft systolic murmurs and no lower extremity edema ABDOMEN:abdomen soft, non-tender and normal bowel  sounds Musculoskeletal:no cyanosis of digits and no clubbing  NEURO: alert & oriented x 3 with fluent speech, no focal motor/sensory deficits  LABORATORY DATA:  I have reviewed the data as listed Results for orders placed in visit on 01/06/14 (from the past 48 hour(s))  HOLD TUBE, BLOOD BANK     Status: None   Collection Time    01/06/14  9:11 AM      Result Value Ref Range   Hold Tube, Blood Bank Blood Bank Order Cancelled per Dr Alvy Bimler      Lab Results  Component Value Date   WBC 13.1* 12/30/2013   HGB 8.7* 12/30/2013   HCT 24.7* 12/30/2013   MCV 85.0 12/30/2013   PLT 358 12/30/2013    ASSESSMENT & PLAN:  Sickle cell anemia affecting pregnancy, antepartum This patient has no recent acute crisis. She has robust reticulocytosis. I recommend continue high-dose folic acid supplementation. The patient will continue to return here every week with plan for blood transfusion if her hemoglobin gets close to 8 g.    Leukocytosis, unspecified This is likely related to stress response and her pregnancy state. She has no signs and symptoms of infection. Continue close monitoring.    Anemia complicating pregnancy This patient has no recent acute crisis. She has robust reticulocytosis. I recommend continue high-dose folic acid supplementation. The patient will continue to return here every other week with plan for blood transfusion if her hemoglobin gets close to 8 g. Per guideline, I was required to arrange for blood transfusions at the [facility] so that we can benefit from fetal monitoring. I discussed this with the patient and she agreed.      Hx of preeclampsia, prior pregnancy, currently pregnant Her blood pressure is normal and she has no clinical signs of preeclampsia. I will add liver function tests with her future visit.    All questions were answered. The patient knows to call the clinic with any problems, questions or concerns. No barriers to learning was  detected.  I spent 15 minutes counseling the patient face to face. The total time spent in the appointment was 20 minutes and more than 50% was on counseling.     [facility] [place], [place], MD 01/06/2014 9:43 AM

## 2014-01-07 LAB — PREPARE RBC (CROSSMATCH)

## 2014-01-07 LAB — CBC
HCT: 19.5 % — ABNORMAL LOW (ref 36.0–46.0)
Hemoglobin: 7.1 g/dL — ABNORMAL LOW (ref 12.0–15.0)
MCH: 30.1 pg (ref 26.0–34.0)
MCHC: 36.4 g/dL — ABNORMAL HIGH (ref 30.0–36.0)
MCV: 82.6 fL (ref 78.0–100.0)
PLATELETS: 301 10*3/uL (ref 150–400)
RBC: 2.36 MIL/uL — AB (ref 3.87–5.11)
RDW: 22.3 % — ABNORMAL HIGH (ref 11.5–15.5)
WBC: 16.1 10*3/uL — ABNORMAL HIGH (ref 4.0–10.5)

## 2014-01-07 LAB — RPR

## 2014-01-07 LAB — PATHOLOGIST SMEAR REVIEW

## 2014-01-07 MED ORDER — SODIUM CHLORIDE 0.9 % IV SOLN
Freq: Once | INTRAVENOUS | Status: AC
  Administered 2014-01-07: 08:00:00 via INTRAVENOUS

## 2014-01-07 NOTE — Progress Notes (Signed)
Subjective: Postpartum Day 1: Cesarean Delivery Patient reports tolerating PO.    Objective: Vital signs in last 24 hours: Temp:  [97.7 F (36.5 C)-98.9 F (37.2 C)] 97.7 F (36.5 C) (08/21 0605) Pulse Rate:  [55-90] 57 (08/21 0605) Resp:  [13-22] 18 (08/21 0605) BP: (112-157)/(61-92) 128/74 mmHg (08/21 0605) SpO2:  [92 %-99 %] 99 % (08/21 0605) Weight:  [67.949 kg (149 lb 12.8 oz)-68.13 kg (150 lb 3.2 oz)] 68.04 kg (150 lb) (08/20 1435)  Physical Exam:  General: alert Lochia: appropriate Uterine Fundus: firm Incision: healing well DVT Evaluation: No evidence of DVT seen on physical exam.   Recent Labs  01/06/14 2000 01/07/14 0511  HGB 8.5* 7.1*  HCT 23.6* 19.5*    Assessment/Plan: Status post Cesarean section. Doing well postoperatively.  Continue current care Due to her sickle cell disease and anemia, I have offered her transfusion of the 2 units of PRBCs that were held since yesterday. She would like to have this transfusion.  Carston Riedl C. 01/07/2014, 6:11 AM

## 2014-01-07 NOTE — Progress Notes (Signed)
Ur chart review completed.  

## 2014-01-07 NOTE — Anesthesia Postprocedure Evaluation (Signed)
  Anesthesia Post-op Note  Patient: [name]  Procedure(s) Performed: Procedure(s): CESAREAN SECTION (N/A)  Patient Location: [facility]  Anesthesia Type:Spinal  Level of Consciousness: awake  Airway and Oxygen Therapy: Patient Spontanous Breathing  Post-op Pain: mild  Post-op Assessment: Patient's Cardiovascular Status Stable and Respiratory Function Stable  Post-op Vital Signs: stable  Last Vitals:  Filed Vitals:   01/07/14 0748  BP: 121/67  Pulse: 56  Temp: 36.6 C  Resp: 18    Complications: No apparent anesthesia complications

## 2014-01-07 NOTE — Addendum Note (Signed)
Addendum created 01/07/14 0759 by Ignacia Bayley, CRNA   Modules edited: Notes Section   Notes Section:  File: [id number]

## 2014-01-08 ENCOUNTER — Encounter (HOSPITAL_COMMUNITY): Payer: Self-pay | Admitting: Obstetrics & Gynecology

## 2014-01-08 LAB — TYPE AND SCREEN
ABO/RH(D): B POS
ANTIBODY SCREEN: NEGATIVE
UNIT DIVISION: 0
UNIT DIVISION: 0

## 2014-01-08 LAB — CBC
HEMATOCRIT: 25.7 % — AB (ref 36.0–46.0)
HEMOGLOBIN: 9.3 g/dL — AB (ref 12.0–15.0)
MCH: 30.6 pg (ref 26.0–34.0)
MCHC: 36.2 g/dL — ABNORMAL HIGH (ref 30.0–36.0)
MCV: 84.5 fL (ref 78.0–100.0)
Platelets: 295 10*3/uL (ref 150–400)
RBC: 3.04 MIL/uL — ABNORMAL LOW (ref 3.87–5.11)
RDW: 20.4 % — ABNORMAL HIGH (ref 11.5–15.5)
WBC: 18.5 10*3/uL — ABNORMAL HIGH (ref 4.0–10.5)

## 2014-01-08 MED ORDER — BISACODYL 10 MG RE SUPP
10.0000 mg | Freq: Three times a day (TID) | RECTAL | Status: DC | PRN
  Administered 2014-01-08: 10 mg via RECTAL
  Filled 2014-01-08: qty 1

## 2014-01-08 NOTE — Progress Notes (Signed)
NST 01-03-14 reviewed and reactive

## 2014-01-08 NOTE — Progress Notes (Signed)
Subjective: Postpartum Day 2: Cesarean Delivery Patient reports incisional pain, tolerating PO, + flatus and no problems voiding.  A little increased intra abdominal gas  Objective: Vital signs in last 24 hours: Temp:  [97.5 F (36.4 C)-98.6 F (37 C)] 98.2 F (36.8 C) (08/22 0548) Pulse Rate:  [53-71] 71 (08/22 0548) Resp:  [15-20] 15 (08/22 0548) BP: (114-137)/(57-82) 117/69 mmHg (08/22 0548) SpO2:  [93 %-98 %] 94 % (08/22 0548)  Physical Exam:  General: alert, cooperative and no distress Lochia: appropriate Uterine Fundus: firm Incision: healing well, dressing dry DVT Evaluation: No evidence of DVT seen on physical exam.   Recent Labs  01/07/14 0511 01/08/14 0545  HGB 7.1* 9.3*  HCT 19.5* 25.7*    Assessment/Plan: Status post Cesarean section. Doing well postoperatively.  Continue current care Will order a suppository to help with her gas.  EURE,LUTHER H 01/08/2014, 7:21 AM

## 2014-01-09 DIAGNOSIS — O365990 Maternal care for other known or suspected poor fetal growth, unspecified trimester, not applicable or unspecified: Secondary | ICD-10-CM

## 2014-01-09 DIAGNOSIS — O09299 Supervision of pregnancy with other poor reproductive or obstetric history, unspecified trimester: Secondary | ICD-10-CM

## 2014-01-09 MED ORDER — OXYCODONE-ACETAMINOPHEN 5-325 MG PO TABS: 1.0000 | ORAL_TABLET | ORAL | Status: DC | PRN

## 2014-01-09 MED ORDER — IBUPROFEN 600 MG PO TABS: 600.0000 mg | ORAL_TABLET | Freq: Four times a day (QID) | ORAL | Status: DC | PRN

## 2014-01-09 NOTE — Progress Notes (Signed)
Discharge instructions provided to patient at bedside.  Medications, activity, follow up appointments, incision care, when to call the doctor and community resources discussed.  No questions at this time.  Patient left unit in stable condition with all personal belongings and prescription accompanied by staff.  Leighton Roach, RN-------

## 2014-01-09 NOTE — Discharge Instructions (Signed)
Cesarean Delivery, Care After °Refer to this sheet in the next few weeks. These instructions provide you with information on caring for yourself after your procedure. Your health care provider may also give you specific instructions. Your treatment has been planned according to current medical practices, but problems sometimes occur. Call your health care provider if you have any problems or questions after you go home. °HOME CARE INSTRUCTIONS  °· Only take over-the-counter or prescription medications as directed by your health care provider. °· Do not drink alcohol, especially if you are breastfeeding or taking medication to relieve pain. °· Do not chew or smoke tobacco. °· Continue to use good perineal care. Good perineal care includes: °¨ Wiping your perineum from front to back. °¨ Keeping your perineum clean. °· Check your surgical cut (incision) daily for increased redness, drainage, swelling, or separation of skin. °· Clean your incision gently with soap and water every day, and then pat it dry. If your health care provider says it is okay, leave the incision uncovered. Use a bandage (dressing) if the incision is draining fluid or appears irritated. If the adhesive strips across the incision do not fall off within 7 days, carefully peel them off. °· Hug a pillow when coughing or sneezing until your incision is healed. This helps to relieve pain. °· Do not use tampons or douche until your health care provider says it is okay. °· Shower, wash your hair, and take tub baths as directed by your health care provider. °· Wear a well-fitting bra that provides breast support. °· Limit wearing support panties or control-top hose. °· Drink enough fluids to keep your urine clear or pale yellow. °· Eat high-fiber foods such as whole grain cereals and breads, brown rice, beans, and fresh fruits and vegetables every day. These foods may help prevent or relieve constipation. °· Resume activities such as climbing stairs,  driving, lifting, exercising, or traveling as directed by your health care provider. °· Talk to your health care provider about resuming sexual activities. This is dependent upon your risk of infection, your rate of healing, and your comfort and desire to resume sexual activity. °· Try to have someone help you with your household activities and your newborn for at least a few days after you leave the hospital. °· Rest as much as possible. Try to rest or take a nap when your newborn is sleeping. °· Increase your activities gradually. °· Keep all of your scheduled postpartum appointments. It is very important to keep your scheduled follow-up appointments. At these appointments, your health care provider will be checking to make sure that you are healing physically and emotionally. °SEEK MEDICAL CARE IF:  °· You are passing large clots from your vagina. Save any clots to show your health care provider. °· You have a foul smelling discharge from your vagina. °· You have trouble urinating. °· You are urinating frequently. °· You have pain when you urinate. °· You have a change in your bowel movements. °· You have increasing redness, pain, or swelling near your incision. °· You have pus draining from your incision. °· Your incision is separating. °· You have painful, hard, or reddened breasts. °· You have a severe headache. °· You have blurred vision or see spots. °· You feel sad or depressed. °· You have thoughts of hurting yourself or your newborn. °· You have questions about your care, the care of your newborn, or medications. °· You are dizzy or light-headed. °· You have a rash. °· You   have pain, redness, or swelling at the site of the removed intravenous access (IV) tube. °· You have nausea or vomiting. °· You stopped breastfeeding and have not had a menstrual period within 12 weeks of stopping. °· You are not breastfeeding and have not had a menstrual period within 12 weeks of delivery. °· You have a fever. °SEEK  IMMEDIATE MEDICAL CARE IF: °· You have persistent pain. °· You have chest pain. °· You have shortness of breath. °· You faint. °· You have leg pain. °· You have stomach pain. °· Your vaginal bleeding saturates 2 or more sanitary pads in 1 hour. °MAKE SURE YOU:  °· Understand these instructions. °· Will watch your condition. °· Will get help right away if you are not doing well or get worse. °Document Released: 01/26/2002 Document Revised: 09/20/2013 Document Reviewed: 01/01/2012 °[employer]® Patient Information ©2015 [employer]. This information is not intended to replace advice given to you by your health care provider. Make sure you discuss any questions you have with your health care provider. ° °

## 2014-01-09 NOTE — Discharge Summary (Signed)
Physician Discharge Summary  Patient ID: [name] MRN: [id number] DOB/AGE: May 18, 1971 43 y.o.  Admit date: 01/06/2014 Discharge date: 01/09/2014  Admission Diagnoses: [redacted] weeks EGA with IUGR, previous c/s  Discharge Diagnoses: same Active Problems:   IUGR (intrauterine growth restriction)   Discharged Condition: good  Hospital Course: She initially decided to have an IOL/TOLAC, but she they decided to have a RLTCS. She had an uncomplicated RLTCS and did well post operatively. By POD #3, she voiced her readiness to go home. She was ambulating, voiding, and tolerating po well. Her baby only weighed 3#13 ounces and it went to the NICU for care.  Consults: None  Significant Diagnostic Studies: labs: HBG 10  Treatments: surgery: as above  Discharge Exam: Blood pressure 132/73, pulse 71, temperature 98.4 F (36.9 C), temperature source Oral, resp. rate 18, height 5\' 7"  (1.702 m), weight 68.04 kg (150 lb), last menstrual period 04/28/2013, SpO2 97.00%, unknown if currently breastfeeding. General appearance: alert Resp: clear to auscultation bilaterally Cardio: regular rate and rhythm, S1, S2 normal, no murmur, click, rub or gallop Incision/Wound:c/d/i Abd- benign, uterus firm at U-3  Disposition: 01-Home or Self Care     Medication List    STOP taking these medications       acetaminophen 325 MG tablet  Commonly known as:  TYLENOL     oxycodone 5 MG capsule  Commonly known as:  OXY-IR      TAKE these medications       aspirin 81 MG tablet  Take 81 mg by mouth daily.     folic acid 1 MG tablet  Commonly known as:  FOLVITE  Take 5 tablets (5 mg total) by mouth daily. Please dispense higher strengths if available     ibuprofen 600 MG tablet  Commonly known as:  ADVIL,MOTRIN  Take 1 tablet (600 mg total) by mouth every 6 (six) hours as needed for mild pain.     oxyCODONE-acetaminophen 5-325 MG per tablet  Commonly known as:  PERCOCET/ROXICET  Take 1-2 tablets by  mouth every 4 (four) hours as needed for severe pain (moderate - severe pain).     PRENATAL PO  Take 1 tablet by mouth daily.           Follow-up Information   Follow up with [facility]. Schedule an appointment as soon as possible for a visit in 6 weeks.   Contact information:   [place] [address] [phone]      Signed: Emily Filbert. 01/09/2014, 7:34 AM

## 2014-01-10 ENCOUNTER — Other Ambulatory Visit: Payer: Medicaid Other

## 2014-01-13 ENCOUNTER — Ambulatory Visit (HOSPITAL_COMMUNITY): Payer: Medicaid Other

## 2014-01-13 ENCOUNTER — Other Ambulatory Visit: Payer: Medicaid Other

## 2014-01-14 ENCOUNTER — Telehealth (HOSPITAL_COMMUNITY): Payer: Self-pay | Admitting: *Deleted

## 2014-01-14 NOTE — Telephone Encounter (Signed)
Information is provided regarding [name] event. All questions answered; Pt is aware. [name], [name]

## 2014-01-20 ENCOUNTER — Other Ambulatory Visit (HOSPITAL_BASED_OUTPATIENT_CLINIC_OR_DEPARTMENT_OTHER): Payer: Medicaid Other

## 2014-01-20 ENCOUNTER — Ambulatory Visit (HOSPITAL_BASED_OUTPATIENT_CLINIC_OR_DEPARTMENT_OTHER): Payer: Medicaid Other | Admitting: Hematology and Oncology

## 2014-01-20 ENCOUNTER — Encounter: Payer: Self-pay | Admitting: Hematology and Oncology

## 2014-01-20 VITALS — BP 128/81 | HR 71 | Temp 98.4°F | Resp 18 | Ht 67.0 in | Wt 136.5 lb

## 2014-01-20 DIAGNOSIS — Z23 Encounter for immunization: Secondary | ICD-10-CM

## 2014-01-20 DIAGNOSIS — D571 Sickle-cell disease 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: Secondary | ICD-10-CM

## 2014-01-20 DIAGNOSIS — Z3490 Encounter for supervision of normal pregnancy, unspecified, unspecified trimester: Secondary | ICD-10-CM

## 2014-01-20 DIAGNOSIS — O9081 Anemia of the puerperium: Secondary | ICD-10-CM

## 2014-01-20 DIAGNOSIS — O99019 Anemia complicating pregnancy, unspecified trimester: Secondary | ICD-10-CM

## 2014-01-20 DIAGNOSIS — D649 Anemia, unspecified: Secondary | ICD-10-CM

## 2014-01-20 DIAGNOSIS — R7989 Other specified abnormal findings of blood chemistry: Secondary | ICD-10-CM

## 2014-01-20 LAB — CBC & DIFF AND RETIC
BASO%: 1.1 % (ref 0.0–2.0)
BASOS ABS: 0.2 10*3/uL — AB (ref 0.0–0.1)
EOS%: 3.6 % (ref 0.0–7.0)
Eosinophils Absolute: 0.5 10*3/uL (ref 0.0–0.5)
HEMATOCRIT: 25.2 % — AB (ref 34.8–46.6)
HGB: 8.7 g/dL — ABNORMAL LOW (ref 11.6–15.9)
LYMPH%: 19.4 % (ref 14.0–49.7)
MCH: 29.1 pg (ref 25.1–34.0)
MCHC: 34.5 g/dL (ref 31.5–36.0)
MCV: 84.3 fL (ref 79.5–101.0)
MONO#: 1.9 10*3/uL — AB (ref 0.1–0.9)
MONO%: 13.4 % (ref 0.0–14.0)
NEUT#: 8.7 10*3/uL — ABNORMAL HIGH (ref 1.5–6.5)
NEUT%: 62.5 % (ref 38.4–76.8)
Platelets: 611 10*3/uL — ABNORMAL HIGH (ref 145–400)
RBC: 2.99 10*6/uL — ABNORMAL LOW (ref 3.70–5.45)
RDW: 20 % — ABNORMAL HIGH (ref 11.2–14.5)
WBC: 14 10*3/uL — AB (ref 3.9–10.3)
lymph#: 2.7 10*3/uL (ref 0.9–3.3)
nRBC: 2 % — ABNORMAL HIGH (ref 0–0)

## 2014-01-20 LAB — COMPREHENSIVE METABOLIC PANEL (CC13)
ALBUMIN: 3.3 g/dL — AB (ref 3.5–5.0)
ALT: 23 U/L (ref 0–55)
ANION GAP: 8 meq/L (ref 3–11)
AST: 38 U/L — ABNORMAL HIGH (ref 5–34)
Alkaline Phosphatase: 344 U/L — ABNORMAL HIGH (ref 40–150)
BUN: 6 mg/dL — AB (ref 7.0–26.0)
CALCIUM: 9 mg/dL (ref 8.4–10.4)
CHLORIDE: 107 meq/L (ref 98–109)
CO2: 23 meq/L (ref 22–29)
Creatinine: 0.5 mg/dL — ABNORMAL LOW (ref 0.6–1.1)
GLUCOSE: 72 mg/dL (ref 70–140)
POTASSIUM: 3.9 meq/L (ref 3.5–5.1)
Sodium: 139 mEq/L (ref 136–145)
Total Bilirubin: 2.01 mg/dL — ABNORMAL HIGH (ref 0.20–1.20)
Total Protein: 7.7 g/dL (ref 6.4–8.3)

## 2014-01-20 LAB — RETICULOCYTES (CHCC)
ABS Retic: 392.2 10*3/uL — ABNORMAL HIGH (ref 19.0–186.0)
RBC.: 3.04 MIL/uL — AB (ref 3.87–5.11)
RETIC CT PCT: 12.9 % — AB (ref 0.4–2.3)

## 2014-01-20 LAB — HOLD TUBE, BLOOD BANK

## 2014-01-20 MED ORDER — INFLUENZA VAC SPLIT QUAD 0.5 ML IM SUSY
0.5000 mL | PREFILLED_SYRINGE | Freq: Once | INTRAMUSCULAR | Status: AC
  Administered 2014-01-20: 0.5 mL via INTRAMUSCULAR
  Filled 2014-01-20: qty 0.5

## 2014-01-20 NOTE — Assessment & Plan Note (Signed)
[name] remained persistently anemic after deliver Reed, likely secondary to mild postpartum bleeding. I recommend that [name] continue high-dose folic acid. I would discharge her from hematology clinic and recommend [name] follows at the sickle cell clinic in the future.

## 2014-01-20 NOTE — Assessment & Plan Note (Signed)
She has high total bilirubin likely due to ineffective erythropoiesis. Her liver function tests are improving. I recommend continue observation for now.

## 2014-01-20 NOTE — Progress Notes (Signed)
[name] OFFICE PROGRESS NOTE  [name],[name], MD SUMMARY OF HEMATOLOGIC HISTORY: [name] 43 y.o. 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 According to the patient, she is pregnant again, with a due date expected on 02/02/2014. Previously, when she was pregnant with her daughter, she had severe hypertension, and intrauterine growth retardation of her daughter and her baby was delivered with C-section at 2 weeks. Her daughter is currently healthy. With her sickle cell disease, she had recurrent admission to the hospital 2-3 times a year for painful chest crisis. She was also known to have avascular necrosis of hips but never required any joint surgery. She never developed history of stroke. She had numerous transfusions in the past without complications. The patient does not take hydroxyurea when she was not pregnant. She was found to have abnormal CBC from her routine blood work recently, the last time she had exchange transfusion a few months ago for anemia of 7.9 g. She had received numerous blood transfusion to keep hemoglobin greater than 8 g during pregnancy. On 01/06/2014, she had C Section to deliver a healthy infant due to intrauterine growth retardation. She received blood transfusion after delivery. INTERVAL HISTORY: [name] 43 y.o. female returns for further followup. She feels well. She continues to mild headaches. She remained on high-dose folic acid supplement. She continue to have menstruation after delivery but according to her it is not heavy. She denies symptoms of dizziness or shortness of breath. She is attempting to nurse her son.  I have reviewed the past medical history, past surgical history, social history and family history with the patient and they are unchanged from previous note.  ALLERGIES:  has No Known  Allergies.  MEDICATIONS:  Current Outpatient Prescriptions  Medication Sig Dispense Refill  . aspirin 81 MG tablet Take 81 mg by mouth daily.      . folic acid (FOLVITE) 1 MG tablet Take 5 tablets (5 mg total) by mouth daily. Please dispense higher strengths if available  300 tablet  3  . ibuprofen (ADVIL,MOTRIN) 600 MG tablet Take 1 tablet (600 mg total) by mouth every 6 (six) hours as needed for mild pain.  30 tablet  0  . oxyCODONE-acetaminophen (PERCOCET/ROXICET) 5-325 MG per tablet Take 1-2 tablets by mouth every 4 (four) hours as needed for severe pain (moderate - severe pain).  30 tablet  0  . Prenatal Vit-Fe Fumarate-FA (PRENATAL PO) Take 1 tablet by mouth daily.       Current Facility-Administered Medications  Medication Dose Route Frequency Provider Last Rate Last Dose  . Influenza vac split quadrivalent PF (FLUARIX) injection 0.5 mL  0.5 mL Intramuscular Once Heath Lark, MD         REVIEW OF SYSTEMS:   Constitutional: Denies fevers, chills or night sweats Eyes: Denies blurriness of vision Ears, nose, mouth, throat, and face: Denies mucositis or sore throat Respiratory: Denies cough, dyspnea or wheezes Cardiovascular: Denies palpitation, chest discomfort or lower extremity swelling Gastrointestinal:  Denies nausea, heartburn or change in bowel habits Skin: Denies abnormal skin rashes Lymphatics: Denies new lymphadenopathy or easy bruising Neurological:Denies numbness, tingling or new weaknesses Behavioral/Psych: Mood is stable, no new changes  All other systems were reviewed with the patient and are negative.  PHYSICAL EXAMINATION: ECOG PERFORMANCE STATUS: 1 - Symptomatic but completely ambulatory  Filed Vitals:   01/20/14 0825  BP:  128/81  Pulse: 71  Temp: 98.4 F (36.9 C)  Resp: 18   Filed Weights   01/20/14 0825  Weight: 136 lb 8 oz (61.916 kg)    GENERAL:alert, no distress and comfortable SKIN: skin color, texture, turgor are normal, no rashes or  significant lesions EYES: normal, Conjunctiva are jaundiced  NEURO: alert & oriented x 3 with fluent speech, no focal motor/sensory deficits  LABORATORY DATA:  I have reviewed the data as listed Results for orders placed in visit on 01/20/14 (from the past 48 hour(s))  CBC & DIFF AND RETIC     Status: Abnormal   Collection Time    01/20/14  7:57 AM      Result Value Ref Range   WBC 14.0 (*) 3.9 - 10.3 10e3/uL   NEUT# 8.7 (*) 1.5 - 6.5 10e3/uL   HGB 8.7 (*) 11.6 - 15.9 g/dL   HCT 25.2 (*) 34.8 - 46.6 %   Platelets 611 (*) 145 - 400 10e3/uL   MCV 84.3  79.5 - 101.0 fL   MCH 29.1  25.1 - 34.0 pg   MCHC 34.5  31.5 - 36.0 g/dL   RBC 2.99 (*) 3.70 - 5.45 10e6/uL   RDW 20.0 (*) 11.2 - 14.5 %   lymph# 2.7  0.9 - 3.3 10e3/uL   MONO# 1.9 (*) 0.1 - 0.9 10e3/uL   Eosinophils Absolute 0.5  0.0 - 0.5 10e3/uL   Basophils Absolute 0.2 (*) 0.0 - 0.1 10e3/uL   NEUT% 62.5  38.4 - 76.8 %   LYMPH% 19.4  14.0 - 49.7 %   MONO% 13.4  0.0 - 14.0 %   EOS% 3.6  0.0 - 7.0 %   BASO% 1.1  0.0 - 2.0 %   nRBC 2 (*) 0 - 0 %   Retic Comment Sent out for confirmation.     Comment: Note New Reference Ranges.  COMPREHENSIVE METABOLIC PANEL (NW29)     Status: Abnormal   Collection Time    01/20/14  7:58 AM      Result Value Ref Range   Sodium 139  136 - 145 mEq/L   Potassium 3.9  3.5 - 5.1 mEq/L   Chloride 107  98 - 109 mEq/L   CO2 23  22 - 29 mEq/L   Glucose 72  70 - 140 mg/dl   BUN 6.0 (*) 7.0 - 26.0 mg/dL   Creatinine 0.5 (*) 0.6 - 1.1 mg/dL   Total Bilirubin 2.01 (*) 0.20 - 1.20 mg/dL   Alkaline Phosphatase 344 (*) 40 - 150 U/L   AST 38 (*) 5 - 34 U/L   ALT 23  0 - 55 U/L   Total Protein 7.7  6.4 - 8.3 g/dL   Albumin 3.3 (*) 3.5 - 5.0 g/dL   Calcium 9.0  8.4 - 10.4 mg/dL   Anion Gap 8  3 - 11 mEq/L    Lab Results  Component Value Date   WBC 14.0* 01/20/2014   HGB 8.7* 01/20/2014   HCT 25.2* 01/20/2014   MCV 84.3 01/20/2014   PLT 611* 01/20/2014    ASSESSMENT & PLAN:  Anemia complicating  pregnancy She remained persistently anemic after deliver Reed, likely secondary to mild postpartum bleeding. I recommend that she continue high-dose folic acid. I would discharge her from hematology clinic and recommend she follows at the sickle cell clinic in the future.  Elevated LFTs She has high total bilirubin likely due to ineffective erythropoiesis. Her liver function tests are improving. I recommend  continue observation for now.   We discussed the importance of preventive care and reviewed the vaccination programs. She does not have any prior allergic reactions to influenza vaccination. She agrees to proceed with influenza vaccination today and we will administer it today at the clinic.  All questions were answered. The patient knows to call the clinic with any problems, questions or concerns. No barriers to learning was detected.  I spent 15 minutes counseling the patient face to face. The total time spent in the appointment was 20 minutes and more than 50% was on counseling.     [facility], [place], MD 01/20/2014 8:43 AM

## 2014-01-27 ENCOUNTER — Other Ambulatory Visit: Payer: Medicaid Other

## 2014-02-03 ENCOUNTER — Other Ambulatory Visit: Payer: Medicaid Other

## 2014-02-17 ENCOUNTER — Encounter: Payer: Self-pay | Admitting: Obstetrics and Gynecology

## 2014-02-17 ENCOUNTER — Ambulatory Visit (INDEPENDENT_AMBULATORY_CARE_PROVIDER_SITE_OTHER): Payer: Medicaid Other | Admitting: Obstetrics and Gynecology

## 2014-02-17 LAB — POCT PREGNANCY, URINE: Preg Test, Ur: NEGATIVE

## 2014-02-17 MED ORDER — NORETHINDRONE 0.35 MG PO TABS: 1.0000 | ORAL_TABLET | Freq: Every day | ORAL | Status: DC

## 2014-02-17 NOTE — Patient Instructions (Signed)
Oral Contraception Information Oral contraceptive pills (OCPs) are medicines taken to prevent pregnancy. OCPs work by preventing the ovaries from releasing eggs. The hormones in OCPs also cause the cervical mucus to thicken, preventing the sperm from entering the uterus. The hormones also cause the uterine lining to become thin, not allowing a fertilized egg to attach to the inside of the uterus. OCPs are highly effective when taken exactly as prescribed. However, OCPs do not prevent sexually transmitted diseases (STDs). Safe sex practices, such as using condoms along with the pill, can help prevent STDs.  Before taking the pill, you may have a physical exam and Pap test. Your health care provider may order blood tests. The health care provider will make sure you are a good candidate for oral contraception. Discuss with your health care provider the possible side effects of the OCP you may be prescribed. When starting an OCP, it can take 2 to 3 months for the body to adjust to the changes in hormone levels in your body.  TYPES OF ORAL CONTRACEPTION  The combination pill--This pill contains estrogen and progestin (synthetic progesterone) hormones. The combination pill comes in 21-day, 28-day, or 91-day packs. Some types of combination pills are meant to be taken continuously (365-day pills). With 21-day packs, you do not take pills for 7 days after the last pill. With 28-day packs, the pill is taken every day. The last 7 pills are without hormones. Certain types of pills have more than 21 hormone-containing pills. With 91-day packs, the first 84 pills contain both hormones, and the last 7 pills contain no hormones or contain estrogen only.  The minipill--This pill contains the progesterone hormone only. The pill is taken every day continuously. It is very important to take the pill at the same time each day. The minipill comes in packs of 28 pills. All 28 pills contain the hormone.  ADVANTAGES OF ORAL  CONTRACEPTIVE PILLS  Decreases premenstrual symptoms.   Treats menstrual period cramps.   Regulates the menstrual cycle.   Decreases a heavy menstrual flow.   May treatacne, depending on the type of pill.   Treats abnormal uterine bleeding.   Treats polycystic ovarian syndrome.   Treats endometriosis.   Can be used as emergency contraception.  THINGS THAT CAN MAKE ORAL CONTRACEPTIVE PILLS LESS EFFECTIVE OCPs can be less effective if:   You forget to take the pill at the same time every day.   You have a stomach or intestinal disease that lessens the absorption of the pill.   You take OCPs with other medicines that make OCPs less effective, such as antibiotics, certain HIV medicines, and some seizure medicines.   You take expired OCPs.   You forget to restart the pill on day 7, when using the packs of 21 pills.  RISKS ASSOCIATED WITH ORAL CONTRACEPTIVE PILLS  Oral contraceptive pills can sometimes cause side effects, such as:  Headache.  Nausea.  Breast tenderness.  Irregular bleeding or spotting. Combination pills are also associated with a small increased risk of:  Blood clots.  Heart attack.  Stroke. Document Released: 07/27/2002 Document Revised: 02/24/2013 Document Reviewed: 10/25/2012 Exit[employer] Patient Information 2015 [employer]. This information is not intended to replace advice given to you by your health care provider. Make sure you discuss any questions you have with your health care provider.  

## 2014-02-17 NOTE — Progress Notes (Signed)
  Subjective:     [name] is a 43 y.o. female who presents for a postpartum visit. She is 4 weeks postpartum following a repeat low cervical transverse Cesarean section. I have fully reviewed the prenatal and intrapartum course. She was followed for fetal growth lag. At 36 weeks with oligohydramnios, high normal  UA Dopplersand elevated blood pressures decision was made for delivery. She initially wanted to trial of labor but after admission elected repeat C-section.The delivery was at 54 gestational weeks. Outcome: repeat cesarean section, low transverse incision. Anesthesia: epidural. Postpartum course has been uncomplicated. Baby's course has been uncomplicated. Baby is feeding by breat and bottle. Bleeding no bleeding. Bowel function is normal. Bladder function is normal. Patient is not sexually active. Contraception method is abstinence. Postpartum depression screening: negative.  The following portions of the patient's history were reviewed and updated as appropriate: allergies, current medications, past family history, past medical history, past social history, past surgical history and problem list.  Review of Systems Pertinent items are noted in HPI.   Objective:    BP 126/87  Pulse 79  Temp(Src) 98.4 F (36.9 C)  Wt 132 lb 4.8 oz (60.011 kg)  Breastfeeding? Yes  General:  alert and no distress   Breasts:  inspection negative, no nipple discharge or bleeding, no masses or nodularity palpable  Lungs: clear to auscultation bilaterally  Heart:  regular rate and rhythm, S1, S2 normal, no murmur, click, rub or gallop  Abdomen: soft, non-tender; bowel sounds normal; no masses,  no organomegaly Pfannenstiel incision completely healed   Vulva:  not evaluated  Vagina: not evaluated  Cervix:  not evaluated  Corpus: not examined  Adnexa:  not evaluated  Rectal Exam: Not performed.        Assessment:     4 wk postpartum exam. Pap smear not done at today's visit.   Plan:    1.  Lengthy discussion regarding contraceptive options. LARC advised. She will let us know if she decides on Nexplanon or IUD but for now would like oral contraception Contraception: oral progesterone-only contraceptive Micronor prescribed. Use back up method first month  2. Continue prenatal vitamins for one month 3. Follow up in: 6 months or as needed.

## 2014-03-21 ENCOUNTER — Encounter: Payer: Self-pay | Admitting: Obstetrics and Gynecology

## 2014-03-24 ENCOUNTER — Other Ambulatory Visit: Payer: Self-pay | Admitting: Hematology and Oncology

## 2014-03-24 ENCOUNTER — Ambulatory Visit (INDEPENDENT_AMBULATORY_CARE_PROVIDER_SITE_OTHER): Payer: Medicaid Other | Admitting: Internal Medicine

## 2014-03-24 DIAGNOSIS — D571 Sickle-cell disease 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: Secondary | ICD-10-CM

## 2014-03-24 DIAGNOSIS — Z23 Encounter for immunization: Secondary | ICD-10-CM

## 2014-03-24 DIAGNOSIS — Z0000 Encounter for general adult medical examination without abnormal findings: Secondary | ICD-10-CM

## 2014-03-24 LAB — COMPLETE METABOLIC PANEL WITH GFR
ALT: 27 U/L (ref 0–35)
AST: 52 U/L — ABNORMAL HIGH (ref 0–37)
Albumin: 4.1 g/dL (ref 3.5–5.2)
Alkaline Phosphatase: 63 U/L (ref 39–117)
BUN: 7 mg/dL (ref 6–23)
CALCIUM: 9 mg/dL (ref 8.4–10.5)
CHLORIDE: 102 meq/L (ref 96–112)
CO2: 23 mEq/L (ref 19–32)
Creat: 0.36 mg/dL — ABNORMAL LOW (ref 0.50–1.10)
GFR, Est African American: >89 mL/min
Glucose, Bld: 82 mg/dL (ref 70–99)
Potassium: 4 mEq/L (ref 3.5–5.3)
Sodium: 137 mEq/L (ref 135–145)
Total Bilirubin: 4.8 mg/dL — ABNORMAL HIGH (ref 0.2–1.2)
Total Protein: 7.6 g/dL (ref 6.0–8.3)

## 2014-03-24 LAB — FERRITIN: Ferritin: 968 ng/mL — ABNORMAL HIGH (ref 10–291)

## 2014-03-24 LAB — LACTATE DEHYDROGENASE: LDH: 537 U/L — ABNORMAL HIGH (ref 94–250)

## 2014-03-24 MED ORDER — IBUPROFEN 600 MG PO TABS: 600.0000 mg | ORAL_TABLET | Freq: Four times a day (QID) | ORAL | Status: DC | PRN

## 2014-03-25 LAB — CBC WITH DIFFERENTIAL/PLATELET
Basophils Absolute: 0.1 10*3/uL (ref 0.0–0.1)
Basophils Relative: 1 % (ref 0–1)
EOS ABS: 0.4 10*3/uL (ref 0.0–0.7)
EOS PCT: 4 % (ref 0–5)
HCT: 21.1 % — ABNORMAL LOW (ref 36.0–46.0)
Hemoglobin: 7.5 g/dL — ABNORMAL LOW (ref 12.0–15.0)
LYMPHS ABS: 3.9 10*3/uL (ref 0.7–4.0)
Lymphocytes Relative: 36 % (ref 12–46)
MCH: 30.1 pg (ref 26.0–34.0)
MCHC: 35.5 g/dL (ref 30.0–36.0)
MCV: 84.7 fL (ref 78.0–100.0)
Monocytes Absolute: 1.2 10*3/uL — ABNORMAL HIGH (ref 0.1–1.0)
Monocytes Relative: 11 % (ref 3–12)
Neutro Abs: 5.2 10*3/uL (ref 1.7–7.7)
Neutrophils Relative %: 48 % (ref 43–77)
PLATELETS: 374 10*3/uL (ref 150–400)
RBC: 2.49 MIL/uL — ABNORMAL LOW (ref 3.87–5.11)
RDW: 24.2 % — ABNORMAL HIGH (ref 11.5–15.5)
WBC: 10.8 10*3/uL — ABNORMAL HIGH (ref 4.0–10.5)

## 2014-03-25 LAB — URINALYSIS
Bilirubin Urine: NEGATIVE
GLUCOSE, UA: NEGATIVE mg/dL
Ketones, ur: NEGATIVE mg/dL
Nitrite: NEGATIVE
PROTEIN: 100 mg/dL — AB
Specific Gravity, Urine: 1.012 (ref 1.005–1.030)
Urobilinogen, UA: 1 mg/dL (ref 0.0–1.0)
pH: 6 (ref 5.0–8.0)

## 2014-03-25 LAB — RETICULOCYTES
ABS Retic: 532.9 10*3/uL — ABNORMAL HIGH (ref 19.0–186.0)
RBC.: 2.49 MIL/uL — ABNORMAL LOW (ref 3.87–5.11)
Retic Ct Pct: 21.4 % — ABNORMAL HIGH (ref 0.4–2.3)

## 2014-03-28 NOTE — Progress Notes (Signed)
Patient ID: [name], female   DOB: Feb 20, 1971, 43 y.o.   MRN: [id number]   Levetta [name], is a 43 y.o. female  TIR:[id number]  QPY:[id number]  DOB - Oct 09, 1970  CC:  Chief Complaint  Patient presents with  . Follow-up       HPI: [name] is a 43 y.o. female here today to establish medical care. Patient has No headache, No chest pain, No abdominal pain - No Nausea, No new weakness tingling or numbness, No Cough - SOB.  No Known Allergies Past Medical History  Diagnosis Date  . Sickle cell disease   . Anemia 03/30/2012    Hx of sickle cell disease  . Leg ulcer 10/27/2012    Chronic under care of wound clinic  . Cholelithiasis y-1  . H/O miscarriage, currently pregnant   . CAP (community acquired pneumonia)     2014  . Anemia complicating pregnancy 06/13/5807   Current Outpatient Prescriptions on File Prior to Visit  Medication Sig Dispense Refill  . norethindrone (MICRONOR,CAMILA,ERRIN) 0.35 MG tablet Take 1 tablet (0.35 mg total) by mouth daily. 3 Package 11  . Prenatal Vit-Fe Fumarate-FA (PRENATAL PO) Take 1 tablet by mouth daily.    Marland Kitchen aspirin 81 MG tablet Take 81 mg by mouth daily.    Marland Kitchen oxyCODONE-acetaminophen (PERCOCET/ROXICET) 5-325 MG per tablet Take 1-2 tablets by mouth every 4 (four) hours as needed for severe pain (moderate - severe pain). 30 tablet 0   No current facility-administered medications on file prior to visit.   Family History  Problem Relation Age of Onset  . Sickle cell anemia Sister   . Diabetes Mother    History   Social History  . Marital Status: Married    Spouse Name: [name]    Number of Children: 1  . Years of Education: N/A   Occupational History  . Employed in home care.    Social History Main Topics  . Smoking status: Never Smoker   . Smokeless tobacco: Never Used  . Alcohol Use: No  . Drug Use: No  . Sexual Activity: Not Currently    Birth Control/ Protection: None   Other Topics Concern  . Not on  file   Social History Narrative   Lives with husband.    Review of Systems: Constitutional: Negative for fever, chills, diaphoresis, activity change, appetite change and fatigue. HENT: Negative for ear pain, nosebleeds, congestion, facial swelling, rhinorrhea, neck pain, neck stiffness and ear discharge.  Eyes: Negative for pain, discharge, redness, itching and visual disturbance. Respiratory: Negative for cough, choking, chest tightness, shortness of breath, wheezing and stridor.  Cardiovascular: Negative for chest pain, palpitations and leg swelling. Gastrointestinal: Negative for abdominal distention. Genitourinary: Negative for dysuria, urgency, frequency, hematuria, flank pain, decreased urine volume, difficulty urinating and dyspareunia.  Musculoskeletal: Negative for back pain, joint swelling, arthralgia and gait problem. Neurological: Negative for dizziness, tremors, seizures, syncope, facial asymmetry, speech difficulty, weakness, light-headedness, numbness and headaches.  Hematological: Negative for adenopathy. Does not bruise/bleed easily. Psychiatric/Behavioral: Negative for hallucinations, behavioral problems, confusion, dysphoric mood, decreased concentration and agitation.    Objective:         Filed Vitals:   03/24/14 1236  BP: 125/68  Pulse: 68  Temp: 98.3 F (36.8 C)  Resp: 16    Physical Exam: Constitutional: Patient appears well-developed and well-nourished. No distress. HENT: Normocephalic, atraumatic, External right and left ear normal. Oropharynx is clear and moist.  Eyes: Conjunctivae and EOM are normal. PERRLA, no scleral icterus. Neck:  Normal ROM. Neck supple. No JVD. No tracheal deviation. No thyromegaly. CVS: RRR, S1/S2 +, no murmurs, no gallops, no carotid bruit.  Pulmonary: Effort and breath sounds normal, no stridor, rhonchi, wheezes, rales.  Abdominal: Soft. BS +, no distension, tenderness, rebound or guarding.  Musculoskeletal: Normal  range of motion. No edema and no tenderness.  Lymphadenopathy: No lymphadenopathy noted, cervical, inguinal or axillary Neuro: Alert. Normal reflexes, muscle tone coordination. No cranial nerve deficit. Skin: Skin is warm and dry. No rash noted. Not diaphoretic. No erythema. No pallor. Psychiatric: Normal mood and affect. Behavior, judgment, thought content normal.  Lab Results  Component Value Date   WBC 10.8* 03/24/2014   HGB 7.5* 03/24/2014   HCT 21.1* 03/24/2014   MCV 84.7 03/24/2014   PLT 374 03/24/2014   Lab Results  Component Value Date   CREATININE 0.36* 03/24/2014   BUN 7 03/24/2014   NA 137 03/24/2014   K 4.0 03/24/2014   CL 102 03/24/2014   CO2 23 03/24/2014    No results found for: HGBA1C Lipid Panel  No results found for: CHOL, TRIG, HDL, CHOLHDL, VLDL, LDLCALC     Assessment and plan:   1. Hemochromatosis - Pt had multiple transfusion during her pregnancy. Will check her ferritin levels to assess need for chelation therapy. 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 She had a reluctance to using Hydrea before her pregnancy also. She reports that she does not intend to have any more children but is currently not on contraception. Continue Folic acid 2 mg daily - Previous Vitamin D levels were 84. Will re-check at time of annual visit - Pt had vision examination less than 1 year ago. -  Pt has no DOE, SOB or chest pain to warrant  Pulmonary or cardiac examination - Vaccine up to date except for Pneumococcal which she will receive today. - Pt reports pain as minimal and does not want to take any opiates while breast feeding. She reports that morphine has been effective and required only 2-3 days /week. - CBC with Differential - Lactate Dehydrogenase - COMPLETE METABOLIC PANEL WITH GFR - Reticulocytes - Pneumococcal polysaccharide vaccine 23-valent greater than or equal to 2yo subcutaneous/IM - Urinalysis -  ibuprofen (ADVIL,MOTRIN) 600 MG tablet; Take 1 tablet (600 mg total) by mouth every 6 (six) hours as needed for mild pain.  Dispense: 60 tablet; Refill: 0   Return in about 1 month (around 04/23/2014) for Annual Physical.  The patient was given clear instructions to go to ER or return to medical center if symptoms don't improve, worsen or new problems develop. The patient verbalized understanding. The patient was told to call to get lab results if they haven't heard anything in the next week.     This note has been created with Surveyor, quantity. Any transcriptional errors are unintentional.    Holdyn Poyser A., MD [place], [place]   03/28/2014, 11:36 AM

## 2014-04-19 ENCOUNTER — Other Ambulatory Visit (INDEPENDENT_AMBULATORY_CARE_PROVIDER_SITE_OTHER): Payer: Medicaid Other

## 2014-04-19 DIAGNOSIS — D571 Sickle-cell disease 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: Secondary | ICD-10-CM

## 2014-04-19 DIAGNOSIS — Z0000 Encounter for general adult medical examination without abnormal findings: Secondary | ICD-10-CM

## 2014-04-19 LAB — LIPID PANEL
Cholesterol: 144 mg/dL (ref 0–200)
HDL: 27 mg/dL — ABNORMAL LOW (ref >39)
LDL Cholesterol: 96 mg/dL (ref 0–99)
Total CHOL/HDL Ratio: 5.3 Ratio
Triglycerides: 103 mg/dL (ref <150)
VLDL: 21 mg/dL (ref 0–40)

## 2014-04-21 LAB — VITAMIN D 1,25 DIHYDROXY
VITAMIN D3 1, 25 (OH): 103 pg/mL
Vitamin D 1, 25 (OH)2 Total: 103 pg/mL — ABNORMAL HIGH (ref 18–72)
Vitamin D2 1, 25 (OH)2: <8 pg/mL

## 2014-04-26 ENCOUNTER — Ambulatory Visit (INDEPENDENT_AMBULATORY_CARE_PROVIDER_SITE_OTHER): Payer: Medicaid Other | Admitting: Family Medicine

## 2014-04-26 VITALS — BP 118/67 | HR 83 | Temp 98.2°F | Resp 16 | Ht 67.0 in | Wt 134.0 lb

## 2014-04-26 DIAGNOSIS — D571 Sickle-cell disease 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: Secondary | ICD-10-CM

## 2014-04-26 DIAGNOSIS — Z0000 Encounter for general adult medical examination without abnormal findings: Secondary | ICD-10-CM

## 2014-04-26 MED ORDER — IBUPROFEN 600 MG PO TABS: 600.0000 mg | ORAL_TABLET | Freq: Four times a day (QID) | ORAL | Status: DC | PRN

## 2014-04-26 NOTE — Progress Notes (Signed)
Subjective:    Patient ID: [name], female    DOB: 1971-02-15, 43 y.o.   MRN: [id number]  HPI  Patient with a history of sickle cell anemia, Hb SS presents for annual physical examination accompanied by her 16 year old daughter and 38 month old son. She states that she feels well and currently has minimal complaints. [name] states that she is currently breast feeding and plans to continue for 6 months or more.  The patient is sexually active and is currently on oral contraceptive prescribed by gynecologist. The patient wears seatbelts. She had yearly vision examination 1 year ago. She does not exercise or follow a balanced diet. She states that she drinks 6-8 glasses of water per day. She is a nonsmoker and non drinker. She reports that last pap smear was normal and she has not had a mammogram.  She does not exercise on a regular basis. [name] reports having multiple transfusions throughout pregnancy.   [name] also complains of periodic pain to left lower back with is consistent with sickle cell pain. She reports that pain is currently 2/10 described at intermittent and aching. She states that she last had Ibuprofen on yesterday with moderate relief. She state that she is out of Ibuprofen.   Past Medical History  Diagnosis Date  . Sickle cell disease   . Anemia 03/30/2012    Hx of sickle cell disease  . Leg ulcer 10/27/2012    Chronic under care of wound clinic  . Cholelithiasis y-1  . H/O miscarriage, currently pregnant   . CAP (community acquired pneumonia)     2014  . Anemia complicating pregnancy 2/77/8242      Review of Systems  Constitutional: Negative.   HENT: Negative.   Eyes: Negative.   Respiratory: Negative.   Cardiovascular: Negative.   Gastrointestinal: Negative.  Negative for diarrhea and constipation.  Endocrine: Negative.  Negative for polydipsia, polyphagia and polyuria.  Genitourinary: Negative.  Negative for dysuria, urgency, hematuria and  pelvic pain.  Musculoskeletal: Positive for myalgias (occasional flank pain).  Skin: Negative.   Allergic/Immunologic: Negative.   Neurological: Negative.  Negative for tremors, seizures, syncope, speech difficulty, weakness and numbness.  Hematological: Negative.   Psychiatric/Behavioral: Negative.        Objective:   Physical Exam  Constitutional: She is oriented to person, place, and time. Vital signs are normal. She appears well-developed and well-nourished.  HENT:  Head: Normocephalic and atraumatic.  Right Ear: External ear normal.  Eyes: Conjunctivae and EOM are normal. Pupils are equal, round, and reactive to light. Scleral icterus is present.  Neck: Normal range of motion. Neck supple.  Cardiovascular: Normal rate, regular rhythm and normal heart sounds.   Pulmonary/Chest: Effort normal and breath sounds normal.  Abdominal: Soft. Bowel sounds are normal.  Musculoskeletal: Normal range of motion.  Lymphadenopathy:       Head (right side): No submental and no submandibular adenopathy present.       Head (left side): No submental and no submandibular adenopathy present.    She has no cervical adenopathy.       Right cervical: No superficial cervical and no deep cervical adenopathy present.      Left cervical: No superficial cervical and no deep cervical adenopathy present.  Neurological: She is alert and oriented to person, place, and time. She has normal strength and normal reflexes. No cranial nerve deficit or sensory deficit. She displays a negative Romberg sign.  Skin: Skin is warm and dry.  Psychiatric: She has a normal mood and affect. Her behavior is normal. Judgment and thought content normal.         BP 118/67 mmHg  Pulse 83  Temp(Src) 98.2 F (36.8 C) (Oral)  Resp 16  Ht 5\' 7"  (1.702 m)  Wt 134 lb (60.782 kg)  BMI 20.98 kg/m2 Assessment & Plan:  Visit for annual health examination Reviewed labs from 04/19/2014.  Vitamin D and Lipid panel within normal  limits - EKG 12-Lead -Opthalmology visit 1 year ago -Patient overdue for mammogram, she is currently breastfeeding. She will schedule after examination.  Last pap smear, 2 years ago normal per patient.   Up to date on vaccinations  Hb-SS disease 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 She reports occasional sickle cell related pain. I recommend Ibuprofen due to fact that patient is breastfeeding. I recommend that she continues to take folic acid 1 mg daily and drink 64 ounces of water or more.    ibuprofen (ADVIL,MOTRIN) 600 MG tablet; Take 1 tablet (600 mg total) by mouth every 6 (six) hours as needed for mild pain.  Dispense: 60 tablet; Refill: 0   Manley Fason M, FNP

## 2014-04-28 ENCOUNTER — Encounter: Payer: Self-pay | Admitting: Family Medicine

## 2014-07-28 ENCOUNTER — Ambulatory Visit: Payer: Self-pay | Admitting: Internal Medicine

## 2014-08-01 ENCOUNTER — Ambulatory Visit: Payer: Self-pay | Admitting: Internal Medicine

## 2014-11-18 ENCOUNTER — Encounter (HOSPITAL_BASED_OUTPATIENT_CLINIC_OR_DEPARTMENT_OTHER): Payer: Medicaid Other | Attending: Internal Medicine

## 2014-11-18 DIAGNOSIS — G629 Polyneuropathy, unspecified: Secondary | ICD-10-CM | POA: Diagnosis not present

## 2014-11-18 DIAGNOSIS — I872 Venous insufficiency (chronic) (peripheral): Secondary | ICD-10-CM | POA: Diagnosis not present

## 2014-11-18 DIAGNOSIS — D571 Sickle-cell disease 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: Secondary | ICD-10-CM | POA: Diagnosis not present

## 2014-11-18 DIAGNOSIS — L97311 Non-pressure chronic ulcer of right ankle limited to breakdown of skin: Secondary | ICD-10-CM | POA: Diagnosis present

## 2014-11-18 DIAGNOSIS — Z791 Long term (current) use of non-steroidal anti-inflammatories (NSAID): Secondary | ICD-10-CM | POA: Diagnosis not present

## 2014-11-18 DIAGNOSIS — B9562 Methicillin resistant Staphylococcus aureus infection as the cause of diseases classified elsewhere: Secondary | ICD-10-CM | POA: Diagnosis not present

## 2014-11-25 ENCOUNTER — Encounter: Payer: Self-pay | Admitting: Family Medicine

## 2014-11-25 ENCOUNTER — Ambulatory Visit (INDEPENDENT_AMBULATORY_CARE_PROVIDER_SITE_OTHER): Payer: Medicaid Other | Admitting: Family Medicine

## 2014-11-25 VITALS — BP 138/66 | HR 84 | Temp 98.4°F | Resp 16 | Ht 67.0 in | Wt 131.0 lb

## 2014-11-25 DIAGNOSIS — D571 Sickle-cell disease 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: Secondary | ICD-10-CM | POA: Diagnosis not present

## 2014-11-25 DIAGNOSIS — Z791 Long term (current) use of non-steroidal anti-inflammatories (NSAID): Secondary | ICD-10-CM | POA: Diagnosis not present

## 2014-11-25 DIAGNOSIS — R21 Rash and other nonspecific skin eruption: Secondary | ICD-10-CM | POA: Diagnosis not present

## 2014-11-25 DIAGNOSIS — G629 Polyneuropathy, unspecified: Secondary | ICD-10-CM | POA: Diagnosis not present

## 2014-11-25 DIAGNOSIS — L97311 Non-pressure chronic ulcer of right ankle limited to breakdown of skin: Secondary | ICD-10-CM | POA: Diagnosis not present

## 2014-11-25 LAB — COMPLETE METABOLIC PANEL WITH GFR
ALK PHOS: 47 U/L (ref 39–117)
ALT: 16 U/L (ref 0–35)
AST: 35 U/L (ref 0–37)
Albumin: 4.5 g/dL (ref 3.5–5.2)
BILIRUBIN TOTAL: 4.1 mg/dL — AB (ref 0.2–1.2)
BUN: 8 mg/dL (ref 6–23)
CHLORIDE: 104 meq/L (ref 96–112)
CO2: 22 mEq/L (ref 19–32)
Calcium: 9.3 mg/dL (ref 8.4–10.5)
Creat: 0.44 mg/dL — ABNORMAL LOW (ref 0.50–1.10)
GFR, Est Non African American: >89 mL/min
Glucose, Bld: 89 mg/dL (ref 70–99)
Potassium: 4.4 mEq/L (ref 3.5–5.3)
Sodium: 136 mEq/L (ref 135–145)
Total Protein: 7.6 g/dL (ref 6.0–8.3)

## 2014-11-25 LAB — LIPID PANEL
CHOL/HDL RATIO: 5.3 ratio
Cholesterol: 143 mg/dL (ref 0–200)
HDL: 27 mg/dL — AB (ref >=46)
LDL CALC: 89 mg/dL (ref 0–99)
Triglycerides: 137 mg/dL (ref <150)
VLDL: 27 mg/dL (ref 0–40)

## 2014-11-25 MED ORDER — FOLIC ACID 1 MG PO TABS: 5.0000 mg | ORAL_TABLET | Freq: Every day | ORAL | Status: DC

## 2014-11-25 MED ORDER — OXYCODONE-ACETAMINOPHEN 5-325 MG PO TABS: 1.0000 | ORAL_TABLET | ORAL | Status: DC | PRN

## 2014-11-25 NOTE — Progress Notes (Addendum)
Patient ID: [name], female   DOB: Sep 13, 1970, 44 y.o.   MRN: [id number]   [name], is a 44 y.o. female  RCV:[id number]  [id number]:[id number]  DOB - 1971-02-03  CC: No chief complaint on file.      HPI: [name] is a 44 y.o. female here for f/u SCD. She has not been in in 6 Has had no narcotic refills from here in several months. She reports she does not have severe pain very often and uses OTC pain medication mostly. Her main complaint today is an itchy rash on her trunk and extremeties. She has had this rash for about 2 weeks. She is taking benadryl for itching but the rash is not leaving. She reports no new medications, no new washing detergents, etc. She is the only member of her family with a rash.   She also needs a refill on her folic acid. She has been taking 5mg  of folic acid daily which a very high dose. She comes here from the wound center today where she is being treated for lower right leg ulcers.     ROS:  Denies fever, chills, weight, loss, loss of appetite Admits to Skin rash for about 2 weeks. Denies HEENT symptoms Denies chest pain or palpitations Denies coughing, shortness of breath or wheezing Denies abd pain, nausea, vomiting, diarrhea, constipation Admits to occassional musculosketelal pain related to SCD.  EXam:  Alert, oriented, appropriate, in no distress There is a wide spread, papular rash on her trunk and extremeties Neck is supple, FROM with adenopathy or tenderness Lungs are clear to auscultation HS are regular, without m,g,r Abd soft, no organomegaly, masses or  tenderness                                                                                                                                                                                                                                                                                                                                                                                                                                                                                                                                     +  No Known Allergies Past Medical History  Diagnosis Date  . Sickle cell disease   . Anemia 03/30/2012    Hx of sickle cell disease  . Leg ulcer 10/27/2012    Chronic under care of wound clinic  . Cholelithiasis y-1  . H/O miscarriage, currently pregnant   . CAP (community acquired pneumonia)     2014  . Anemia complicating pregnancy 07/18/6008   Current Outpatient Prescriptions on File Prior to Visit  Medication Sig Dispense Refill  . aspirin 81 MG tablet Take 81 mg by mouth daily.    . folic acid (FOLVITE) 1 MG tablet TAKE 5 TABLETS BY MOUTH DAILY 300 tablet 2  . ibuprofen (ADVIL,MOTRIN) 600 MG tablet Take 1 tablet (600 mg total) by mouth every 6 (six) hours as needed for mild pain. 60 tablet 0  . norethindrone (MICRONOR,CAMILA,ERRIN) 0.35 MG tablet Take 1 tablet (0.35 mg total) by mouth daily. 3 Package 11  .  oxyCODONE-acetaminophen (PERCOCET/ROXICET) 5-325 MG per tablet Take 1-2 tablets by mouth every 4 (four) hours as needed for severe pain (moderate - severe pain). 30 tablet 0  . Prenatal Vit-Fe Fumarate-FA (PRENATAL PO) Take 1 tablet by mouth daily.     No current facility-administered medications on file prior to visit.   Family History  Problem Relation Age of Onset  . Sickle cell anemia Sister   . Diabetes Mother    History   Social History  . Marital Status: Married    Spouse Name: [name]  . Number of Children: 1  . Years of Education: N/A   Occupational History  . Employed in home care.    Social History Main Topics  . Smoking status: Never Smoker   . Smokeless tobacco: Never Used  . Alcohol Use: No  . Drug Use: No  . Sexual Activity: Not Currently    Birth Control/ Protection: None   Other Topics Concern  . Not on file   Social History Narrative   Lives with husband.     Objective:  There were no vitals filed for this visit.  Lab Results  Component Value Date   WBC 10.8* 03/24/2014   HGB 7.5* 03/24/2014   HCT 21.1* 03/24/2014   MCV 84.7 03/24/2014   PLT 374 03/24/2014   Lab Results  Component Value Date   CREATININE 0.36* 03/24/2014   BUN 7 03/24/2014   NA 137 03/24/2014   K 4.0 03/24/2014   CL 102 03/24/2014   CO2 23 03/24/2014    No results found for: HGBA1C Lipid Panel     Component Value Date/Time   CHOL 144 04/19/2014 0849   TRIG 103 04/19/2014 0849   HDL 27* 04/19/2014 0849   CHOLHDL 5.3 04/19/2014 0849   VLDL 21 04/19/2014 0849   LDLCALC 96 04/19/2014 0849       Assessment and plan:   SCD, pain is being controlled with current medication. -refill of Oxycodone 2/325, #30; 1-2 every four hours prn. -Refill of folic acid , she has been on 5 mg daily. Will wait for lab results to determine dosage.  Lower extremety ulcers -follow-up with Wound care center as planned.     The patient was given clear instructions to  go to ER or return to medical center if symptoms don't improve, worsen or new problems develop. The patient verbalized understanding. The patient was told to call to get lab results if they haven't heard anything in the next week.        Vaughan Basta  Reyes Ivan, MSN, FNP-BC   11/25/2014, 2:48 PM                                                                                                                                                                                                                                                                                                                                                                                                                                                                                     ROS  Denies fever, ch

## 2014-11-25 NOTE — Patient Instructions (Signed)
Continue current treatment Use benadryl for itching. We are arranging a referral to dermatology. Someone will call you Follow-up with wound center as planned We will call with any abnormal labs that need attention. Follow-up if and when you need a refill on Percocet or in 6 months.

## 2014-11-26 LAB — CBC WITH DIFFERENTIAL/PLATELET
Basophils Absolute: 0.1 10*3/uL (ref 0.0–0.1)
Basophils Relative: 1 % (ref 0–1)
EOS PCT: 3 % (ref 0–5)
Eosinophils Absolute: 0.3 10*3/uL (ref 0.0–0.7)
HCT: 19.4 % — ABNORMAL LOW (ref 36.0–46.0)
HEMOGLOBIN: 7 g/dL — AB (ref 12.0–15.0)
LYMPHS ABS: 2.7 10*3/uL (ref 0.7–4.0)
LYMPHS PCT: 25 % (ref 12–46)
MCH: 30 pg (ref 26.0–34.0)
MCHC: 36.1 g/dL — ABNORMAL HIGH (ref 30.0–36.0)
MCV: 83.3 fL (ref 78.0–100.0)
MPV: 9 fL (ref 8.6–12.4)
Monocytes Absolute: 1.5 10*3/uL — ABNORMAL HIGH (ref 0.1–1.0)
Monocytes Relative: 14 % — ABNORMAL HIGH (ref 3–12)
Neutro Abs: 6.2 10*3/uL (ref 1.7–7.7)
Neutrophils Relative %: 57 % (ref 43–77)
Platelets: 442 10*3/uL — ABNORMAL HIGH (ref 150–400)
RBC: 2.33 MIL/uL — AB (ref 3.87–5.11)
RDW: 25.7 % — ABNORMAL HIGH (ref 11.5–15.5)
WBC: 10.9 10*3/uL — ABNORMAL HIGH (ref 4.0–10.5)

## 2014-11-28 ENCOUNTER — Other Ambulatory Visit: Payer: Self-pay | Admitting: Family Medicine

## 2014-11-28 MED ORDER — FOLIC ACID 1 MG PO TABS: 5.0000 mg | ORAL_TABLET | Freq: Every day | ORAL | Status: DC

## 2014-11-28 MED ORDER — FOLIC ACID 1 MG PO TABS: ORAL_TABLET | ORAL | Status: DC

## 2014-12-02 DIAGNOSIS — G629 Polyneuropathy, unspecified: Secondary | ICD-10-CM | POA: Diagnosis not present

## 2014-12-02 DIAGNOSIS — D571 Sickle-cell disease 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: Secondary | ICD-10-CM | POA: Diagnosis not present

## 2014-12-02 DIAGNOSIS — L97311 Non-pressure chronic ulcer of right ankle limited to breakdown of skin: Secondary | ICD-10-CM | POA: Diagnosis not present

## 2014-12-02 DIAGNOSIS — Z791 Long term (current) use of non-steroidal anti-inflammatories (NSAID): Secondary | ICD-10-CM | POA: Diagnosis not present

## 2014-12-09 ENCOUNTER — Telehealth: Payer: Self-pay

## 2014-12-09 ENCOUNTER — Other Ambulatory Visit: Payer: Self-pay | Admitting: Family Medicine

## 2014-12-09 DIAGNOSIS — G629 Polyneuropathy, unspecified: Secondary | ICD-10-CM | POA: Diagnosis not present

## 2014-12-09 DIAGNOSIS — L97311 Non-pressure chronic ulcer of right ankle limited to breakdown of skin: Secondary | ICD-10-CM | POA: Diagnosis not present

## 2014-12-09 DIAGNOSIS — Z791 Long term (current) use of non-steroidal anti-inflammatories (NSAID): Secondary | ICD-10-CM | POA: Diagnosis not present

## 2014-12-09 DIAGNOSIS — D571 Sickle-cell disease 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: Secondary | ICD-10-CM | POA: Diagnosis not present

## 2014-12-09 MED ORDER — OXYCODONE-ACETAMINOPHEN 5-325 MG PO TABS: 1.0000 | ORAL_TABLET | ORAL | Status: DC | PRN

## 2014-12-09 NOTE — Telephone Encounter (Signed)
Refill request for Percocet 5/325mg . LOV 11/25/2014. Please advise. Thanks!

## 2014-12-09 NOTE — Telephone Encounter (Signed)
Refill request for Percocet. LOV 11/25/2014. Please advise. Thanks!

## 2014-12-10 NOTE — Progress Notes (Deleted)
   Subjective:    Patient ID: [name], female    DOB: 1971-03-26, 44 y.o.   MRN: [id number]  HPI    Review of Systems     Objective:   Physical Exam        Assessment & Plan:

## 2014-12-16 DIAGNOSIS — G629 Polyneuropathy, unspecified: Secondary | ICD-10-CM | POA: Diagnosis not present

## 2014-12-16 DIAGNOSIS — Z791 Long term (current) use of non-steroidal anti-inflammatories (NSAID): Secondary | ICD-10-CM | POA: Diagnosis not present

## 2014-12-16 DIAGNOSIS — L97311 Non-pressure chronic ulcer of right ankle limited to breakdown of skin: Secondary | ICD-10-CM | POA: Diagnosis not present

## 2014-12-16 DIAGNOSIS — D571 Sickle-cell disease 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: Secondary | ICD-10-CM | POA: Diagnosis not present

## 2014-12-23 ENCOUNTER — Encounter (HOSPITAL_BASED_OUTPATIENT_CLINIC_OR_DEPARTMENT_OTHER): Payer: Medicaid Other | Attending: Internal Medicine

## 2014-12-23 DIAGNOSIS — D571 Sickle-cell disease 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: Secondary | ICD-10-CM | POA: Diagnosis not present

## 2014-12-23 DIAGNOSIS — D649 Anemia, unspecified: Secondary | ICD-10-CM | POA: Diagnosis not present

## 2014-12-23 DIAGNOSIS — L97311 Non-pressure chronic ulcer of right ankle limited to breakdown of skin: Secondary | ICD-10-CM | POA: Diagnosis present

## 2014-12-23 DIAGNOSIS — G629 Polyneuropathy, unspecified: Secondary | ICD-10-CM | POA: Diagnosis not present

## 2014-12-28 ENCOUNTER — Other Ambulatory Visit (HOSPITAL_BASED_OUTPATIENT_CLINIC_OR_DEPARTMENT_OTHER): Payer: Self-pay | Admitting: General Surgery

## 2014-12-28 ENCOUNTER — Ambulatory Visit (HOSPITAL_COMMUNITY)
Admission: RE | Admit: 2014-12-28 | Discharge: 2014-12-28 | Disposition: A | Discharge status: Home / Self Care | Payer: Medicaid Other | Source: Ambulatory Visit | Attending: Vascular Surgery | Admitting: Vascular Surgery

## 2014-12-28 DIAGNOSIS — L97911 Non-pressure chronic ulcer of unspecified part of right lower leg limited to breakdown of skin: Secondary | ICD-10-CM

## 2014-12-28 DIAGNOSIS — L97919 Non-pressure chronic ulcer of unspecified part of right lower leg with unspecified severity: Secondary | ICD-10-CM | POA: Diagnosis not present

## 2014-12-28 DIAGNOSIS — R590 Localized enlarged lymph nodes: Secondary | ICD-10-CM | POA: Insufficient documentation

## 2014-12-30 DIAGNOSIS — D649 Anemia, unspecified: Secondary | ICD-10-CM | POA: Diagnosis not present

## 2014-12-30 DIAGNOSIS — G629 Polyneuropathy, unspecified: Secondary | ICD-10-CM | POA: Diagnosis not present

## 2014-12-30 DIAGNOSIS — L97311 Non-pressure chronic ulcer of right ankle limited to breakdown of skin: Secondary | ICD-10-CM | POA: Diagnosis not present

## 2014-12-30 DIAGNOSIS — D571 Sickle-cell disease 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: Secondary | ICD-10-CM | POA: Diagnosis not present

## 2015-01-06 DIAGNOSIS — D571 Sickle-cell disease 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: Secondary | ICD-10-CM | POA: Diagnosis not present

## 2015-01-06 DIAGNOSIS — L97311 Non-pressure chronic ulcer of right ankle limited to breakdown of skin: Secondary | ICD-10-CM | POA: Diagnosis not present

## 2015-01-06 DIAGNOSIS — G629 Polyneuropathy, unspecified: Secondary | ICD-10-CM | POA: Diagnosis not present

## 2015-01-06 DIAGNOSIS — D649 Anemia, unspecified: Secondary | ICD-10-CM | POA: Diagnosis not present

## 2015-01-11 ENCOUNTER — Other Ambulatory Visit: Payer: Self-pay | Admitting: Family Medicine

## 2015-01-11 ENCOUNTER — Telehealth: Payer: Self-pay | Admitting: Family Medicine

## 2015-01-11 MED ORDER — OXYCODONE-ACETAMINOPHEN 5-325 MG PO TABS: 1.0000 | ORAL_TABLET | ORAL | Status: DC | PRN

## 2015-01-11 NOTE — Telephone Encounter (Signed)
Refill request for percocet 5/325mg . LOV 11/25/2014. Please advise. Thanks!

## 2015-01-20 ENCOUNTER — Encounter (HOSPITAL_BASED_OUTPATIENT_CLINIC_OR_DEPARTMENT_OTHER): Payer: Medicaid Other | Attending: Internal Medicine

## 2015-01-20 DIAGNOSIS — D649 Anemia, unspecified: Secondary | ICD-10-CM | POA: Diagnosis not present

## 2015-01-20 DIAGNOSIS — L97311 Non-pressure chronic ulcer of right ankle limited to breakdown of skin: Secondary | ICD-10-CM | POA: Insufficient documentation

## 2015-01-20 DIAGNOSIS — D571 Sickle-cell disease 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: Secondary | ICD-10-CM | POA: Diagnosis not present

## 2015-01-20 DIAGNOSIS — G629 Polyneuropathy, unspecified: Secondary | ICD-10-CM | POA: Diagnosis not present

## 2015-01-27 DIAGNOSIS — L97311 Non-pressure chronic ulcer of right ankle limited to breakdown of skin: Secondary | ICD-10-CM | POA: Diagnosis not present

## 2015-01-27 DIAGNOSIS — G629 Polyneuropathy, unspecified: Secondary | ICD-10-CM | POA: Diagnosis not present

## 2015-01-27 DIAGNOSIS — D571 Sickle-cell disease 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: Secondary | ICD-10-CM | POA: Diagnosis not present

## 2015-01-27 DIAGNOSIS — D649 Anemia, unspecified: Secondary | ICD-10-CM | POA: Diagnosis not present

## 2015-02-02 DIAGNOSIS — D571 Sickle-cell disease 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: Secondary | ICD-10-CM | POA: Diagnosis not present

## 2015-02-02 DIAGNOSIS — D649 Anemia, unspecified: Secondary | ICD-10-CM | POA: Diagnosis not present

## 2015-02-02 DIAGNOSIS — L97311 Non-pressure chronic ulcer of right ankle limited to breakdown of skin: Secondary | ICD-10-CM | POA: Diagnosis not present

## 2015-02-02 DIAGNOSIS — G629 Polyneuropathy, unspecified: Secondary | ICD-10-CM | POA: Diagnosis not present

## 2015-02-06 ENCOUNTER — Encounter: Payer: Self-pay | Admitting: Plastic Surgery

## 2015-02-06 DIAGNOSIS — L97311 Non-pressure chronic ulcer of right ankle limited to breakdown of skin: Secondary | ICD-10-CM | POA: Diagnosis not present

## 2015-02-06 DIAGNOSIS — D649 Anemia, unspecified: Secondary | ICD-10-CM | POA: Diagnosis not present

## 2015-02-06 DIAGNOSIS — D571 Sickle-cell disease 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: Secondary | ICD-10-CM | POA: Diagnosis not present

## 2015-02-06 DIAGNOSIS — G629 Polyneuropathy, unspecified: Secondary | ICD-10-CM | POA: Diagnosis not present

## 2015-02-09 ENCOUNTER — Encounter (HOSPITAL_BASED_OUTPATIENT_CLINIC_OR_DEPARTMENT_OTHER): Payer: Self-pay | Admitting: *Deleted

## 2015-02-09 NOTE — H&P (Signed)
  Chief Complaint  Wounds over her right lateral and right medial ankle in the setting of sickle cell anemia   History of Present Illness (HPI) Referred from Dr. Dellia Nims clinic for evaluation for surgical debridement of chronic ulcers RLE in setting of sickle cell. Wounds have been present over RLE for over 2 months. Prior wound center patient for similar in 2014, healed with local wound care. Has had significant pain and unable to do significant debridement in clinic. Current wound care with [facility]. Had reflux study RLE 12/2014 without reflux or DVT. No compression being used.   Objective   Vitals  Height: 67 in, Source: Stated, Weight: 130 lbs, Source: Stated, BMI: 20.4, Temperature: 98.3 F, Pulse: 70 bpm, Respiratory Rate: 18 breaths/min, Blood Pressure: 108/58 mmHg.    CV: normal heart sounds PULM: clear to auscultation  Integumentary (Hair, Skin)   The wound is located on the Right,Lateral Malleolus. The wound measures 4cm length x 1.3cm width x 0.1cm depth; Additional Right,Medial Malleolus wound measures 4.7cm length x 4.2cm width x 0.1cm depth;   Lateral malleolar wound measures 1.7cm length x 0.8cm width x 0.1cm depth;    Right,Lateral,Posterior Lower Leg. The wound measures 2cm length x 0.9cm width x 0.1cm depth   wounds with slough no cellulits, Tender to touch, no undermining, moderate drainage   Assessment  Active Problems ICD-10 L97.311 - Non-pressure chronic ulcer of right ankle limited to breakdown of skin D57.1 - Sickle-cell disease 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     Plan   Plan debridement in OR under sedation as OP. No healing of wounds with over 2 months local wound care. Will plan application of Theraskin or Apligraf at time of debridement. Prefers Tuesday as husband does not work on that day. Provided my contact information.   Patient does not eat pork and will avoid porcine products.  Irene Limbo, MD [facility] Plastic & Reconstructive Surgery [phone]

## 2015-02-14 ENCOUNTER — Encounter: Payer: Self-pay | Admitting: Plastic Surgery

## 2015-02-14 ENCOUNTER — Ambulatory Visit (HOSPITAL_BASED_OUTPATIENT_CLINIC_OR_DEPARTMENT_OTHER): Payer: Medicaid Other | Admitting: Anesthesiology

## 2015-02-14 ENCOUNTER — Ambulatory Visit (HOSPITAL_BASED_OUTPATIENT_CLINIC_OR_DEPARTMENT_OTHER)
Admission: RE | Admit: 2015-02-14 | Discharge: 2015-02-15 | Disposition: A | Discharge status: Home / Self Care | Payer: Medicaid Other | Source: Ambulatory Visit | Attending: Plastic Surgery | Admitting: Plastic Surgery

## 2015-02-14 ENCOUNTER — Encounter (HOSPITAL_BASED_OUTPATIENT_CLINIC_OR_DEPARTMENT_OTHER)
Admission: RE | Disposition: A | Discharge status: Home / Self Care | Payer: Self-pay | Source: Ambulatory Visit | Attending: Plastic Surgery

## 2015-02-14 DIAGNOSIS — L97311 Non-pressure chronic ulcer of right ankle limited to breakdown of skin: Secondary | ICD-10-CM | POA: Insufficient documentation

## 2015-02-14 DIAGNOSIS — L97919 Non-pressure chronic ulcer of unspecified part of right lower leg with unspecified severity: Secondary | ICD-10-CM | POA: Diagnosis present

## 2015-02-14 DIAGNOSIS — D571 Sickle-cell disease 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: Secondary | ICD-10-CM | POA: Insufficient documentation

## 2015-02-14 DIAGNOSIS — L97929 Non-pressure chronic ulcer of unspecified part of left lower leg with unspecified severity: Secondary | ICD-10-CM | POA: Diagnosis present

## 2015-02-14 HISTORY — PX: ALLOGRAFT APPLICATION: SHX6404

## 2015-02-14 LAB — POCT HEMOGLOBIN-HEMACUE: Hemoglobin: 5.5 g/dL — CL (ref 12.0–15.0)

## 2015-02-14 LAB — PREALBUMIN: PREALBUMIN: 21.9 mg/dL (ref 18–38)

## 2015-02-14 SURGERY — APPLICATION, ALLOGRAFT, SKIN
Anesthesia: General | Site: Ankle | Laterality: Right

## 2015-02-14 MED ORDER — SUFENTANIL CITRATE 50 MCG/ML IV SOLN
INTRAVENOUS | Status: AC
  Filled 2015-02-14: qty 1

## 2015-02-14 MED ORDER — EPHEDRINE SULFATE 50 MG/ML IJ SOLN
INTRAMUSCULAR | Status: DC | PRN
  Administered 2015-02-14: 10 mg via INTRAVENOUS

## 2015-02-14 MED ORDER — OXYCODONE HCL 5 MG PO TABS: 5.0000 mg | ORAL_TABLET | ORAL | Status: DC | PRN

## 2015-02-14 MED ORDER — PROPOFOL 500 MG/50ML IV EMUL
INTRAVENOUS | Status: AC
  Filled 2015-02-14: qty 50

## 2015-02-14 MED ORDER — CEFAZOLIN SODIUM-DEXTROSE 2-3 GM-% IV SOLR
INTRAVENOUS | Status: AC
  Filled 2015-02-14: qty 50

## 2015-02-14 MED ORDER — HYDROMORPHONE HCL 1 MG/ML IJ SOLN
0.2500 mg | INTRAMUSCULAR | Status: DC | PRN
  Administered 2015-02-14 (×3): 0.5 mg via INTRAVENOUS

## 2015-02-14 MED ORDER — EPHEDRINE SULFATE 50 MG/ML IJ SOLN
INTRAMUSCULAR | Status: AC
  Filled 2015-02-14: qty 1

## 2015-02-14 MED ORDER — ONDANSETRON HCL 4 MG/2ML IJ SOLN
INTRAMUSCULAR | Status: DC | PRN
  Administered 2015-02-14: 4 mg via INTRAVENOUS

## 2015-02-14 MED ORDER — ONDANSETRON 4 MG PO TBDP: 4.0000 mg | ORAL_TABLET | Freq: Four times a day (QID) | ORAL | Status: DC | PRN

## 2015-02-14 MED ORDER — MIDAZOLAM HCL 2 MG/2ML IJ SOLN
INTRAMUSCULAR | Status: AC
  Filled 2015-02-14: qty 4

## 2015-02-14 MED ORDER — SCOPOLAMINE 1 MG/3DAYS TD PT72
1.0000 | MEDICATED_PATCH | Freq: Once | TRANSDERMAL | Status: DC | PRN
Start: 2015-02-14 — End: 2015-02-14

## 2015-02-14 MED ORDER — 0.9 % SODIUM CHLORIDE (POUR BTL) OPTIME
TOPICAL | Status: DC | PRN
  Administered 2015-02-14: 800 mL

## 2015-02-14 MED ORDER — ONDANSETRON HCL 4 MG/2ML IJ SOLN: 4.0000 mg | Freq: Four times a day (QID) | INTRAMUSCULAR | Status: DC | PRN

## 2015-02-14 MED ORDER — GLYCOPYRROLATE 0.2 MG/ML IJ SOLN: 0.2000 mg | Freq: Once | INTRAMUSCULAR | Status: DC | PRN

## 2015-02-14 MED ORDER — MORPHINE SULFATE (PF) 2 MG/ML IV SOLN
2.0000 mg | INTRAVENOUS | Status: DC | PRN
  Administered 2015-02-14 (×2): 2 mg via INTRAVENOUS
  Filled 2015-02-14 (×2): qty 1

## 2015-02-14 MED ORDER — LIDOCAINE HCL (CARDIAC) 20 MG/ML IV SOLN
INTRAVENOUS | Status: DC | PRN
  Administered 2015-02-14: 50 mg via INTRAVENOUS

## 2015-02-14 MED ORDER — CEFAZOLIN SODIUM-DEXTROSE 2-3 GM-% IV SOLR
2.0000 g | INTRAVENOUS | Status: AC
  Administered 2015-02-14: 2 g via INTRAVENOUS

## 2015-02-14 MED ORDER — BUPIVACAINE-EPINEPHRINE (PF) 0.25% -1:200000 IJ SOLN
INTRAMUSCULAR | Status: AC
  Filled 2015-02-14: qty 30

## 2015-02-14 MED ORDER — PROPOFOL 10 MG/ML IV BOLUS
INTRAVENOUS | Status: DC | PRN
  Administered 2015-02-14: 200 mg via INTRAVENOUS

## 2015-02-14 MED ORDER — HYDROMORPHONE HCL 1 MG/ML IJ SOLN
INTRAMUSCULAR | Status: AC
  Filled 2015-02-14: qty 1

## 2015-02-14 MED ORDER — DEXAMETHASONE SODIUM PHOSPHATE 4 MG/ML IJ SOLN
INTRAMUSCULAR | Status: DC | PRN
  Administered 2015-02-14: 10 mg via INTRAVENOUS

## 2015-02-14 MED ORDER — LACTATED RINGERS IV SOLN
INTRAVENOUS | Status: DC
Start: 2015-02-14 — End: 2015-02-14
  Administered 2015-02-14 (×3): via INTRAVENOUS

## 2015-02-14 MED ORDER — ONDANSETRON HCL 4 MG/2ML IJ SOLN
INTRAMUSCULAR | Status: AC
Start: 2015-02-14 — End: 2015-02-14
  Filled 2015-02-14: qty 2

## 2015-02-14 MED ORDER — SUFENTANIL CITRATE 50 MCG/ML IV SOLN
INTRAVENOUS | Status: DC | PRN
  Administered 2015-02-14: 20 ug via INTRAVENOUS

## 2015-02-14 MED ORDER — OXYCODONE HCL 5 MG PO TABS
5.0000 mg | ORAL_TABLET | ORAL | Status: DC | PRN
  Administered 2015-02-14 – 2015-02-15 (×4): 10 mg via ORAL
  Filled 2015-02-14 (×4): qty 2

## 2015-02-14 MED ORDER — KCL IN DEXTROSE-NACL 20-5-0.45 MEQ/L-%-% IV SOLN
INTRAVENOUS | Status: DC
  Administered 2015-02-14: 14:00:00 via INTRAVENOUS
  Filled 2015-02-14: qty 1000

## 2015-02-14 MED ORDER — DEXAMETHASONE SODIUM PHOSPHATE 10 MG/ML IJ SOLN
INTRAMUSCULAR | Status: AC
  Filled 2015-02-14: qty 1

## 2015-02-14 MED ORDER — BUPIVACAINE-EPINEPHRINE 0.25% -1:200000 IJ SOLN
INTRAMUSCULAR | Status: DC | PRN
  Administered 2015-02-14: 10 mL

## 2015-02-14 MED ORDER — MIDAZOLAM HCL 5 MG/5ML IJ SOLN
INTRAMUSCULAR | Status: DC | PRN
  Administered 2015-02-14: 2 mg via INTRAVENOUS

## 2015-02-14 MED ORDER — PROMETHAZINE HCL 25 MG/ML IJ SOLN: 6.2500 mg | INTRAMUSCULAR | Status: DC | PRN

## 2015-02-14 SURGICAL SUPPLY — 76 items
BANDAGE ELASTIC 3 VELCRO ST LF (GAUZE/BANDAGES/DRESSINGS) IMPLANT
BANDAGE ELASTIC 4 VELCRO ST LF (GAUZE/BANDAGES/DRESSINGS) ×3 IMPLANT
BANDAGE ELASTIC 6 VELCRO ST LF (GAUZE/BANDAGES/DRESSINGS) IMPLANT
BENZOIN TINCTURE PRP APPL 2/3 (GAUZE/BANDAGES/DRESSINGS) ×3 IMPLANT
BLADE CLIPPER SURG (BLADE) IMPLANT
BLADE DERMATOME SS (BLADE) IMPLANT
BLADE SURG 10 STRL SS (BLADE) ×3 IMPLANT
BLADE SURG 15 STRL LF DISP TIS (BLADE) IMPLANT
BLADE SURG 15 STRL SS (BLADE)
BNDG COHESIVE 4X5 TAN STRL (GAUZE/BANDAGES/DRESSINGS) IMPLANT
BNDG GAUZE ELAST 4 BULKY (GAUZE/BANDAGES/DRESSINGS) ×3 IMPLANT
CANISTER SUCT 1200ML W/VALVE (MISCELLANEOUS) ×3 IMPLANT
CLOSURE WOUND 1/2 X4 (GAUZE/BANDAGES/DRESSINGS) ×1
COTTONBALL LRG STERILE PKG (GAUZE/BANDAGES/DRESSINGS) IMPLANT
COVER BACK TABLE 60X90IN (DRAPES) ×3 IMPLANT
COVER MAYO STAND STRL (DRAPES) IMPLANT
DECANTER SPIKE VIAL GLASS SM (MISCELLANEOUS) IMPLANT
DERMACARRIERS GRAFT 1 TO 1.5 (DISPOSABLE)
DRAPE LAPAROTOMY 100X72 PEDS (DRAPES) IMPLANT
DRAPE SURG 17X23 STRL (DRAPES) IMPLANT
DRAPE U-SHAPE 76X120 STRL (DRAPES) IMPLANT
DRSG ADAPTIC 3X8 NADH LF (GAUZE/BANDAGES/DRESSINGS) ×3 IMPLANT
DRSG EMULSION OIL 3X3 NADH (GAUZE/BANDAGES/DRESSINGS) IMPLANT
DRSG PAD ABDOMINAL 8X10 ST (GAUZE/BANDAGES/DRESSINGS) IMPLANT
ELECT COATED BLADE 2.86 ST (ELECTRODE) IMPLANT
ELECT NEEDLE BLADE 2-5/6 (NEEDLE) IMPLANT
ELECT REM PT RETURN 9FT ADLT (ELECTROSURGICAL)
ELECTRODE REM PT RTRN 9FT ADLT (ELECTROSURGICAL) IMPLANT
GAUZE SPONGE 4X4 12PLY STRL (GAUZE/BANDAGES/DRESSINGS) ×3 IMPLANT
GAUZE XEROFORM 1X8 LF (GAUZE/BANDAGES/DRESSINGS) IMPLANT
GLOVE BIO SURGEON STRL SZ 6 (GLOVE) ×3 IMPLANT
GLOVE BIO SURGEON STRL SZ 6.5 (GLOVE) IMPLANT
GLOVE BIO SURGEONS STRL SZ 6.5 (GLOVE)
GLOVE BIOGEL PI IND STRL 7.0 (GLOVE) ×2 IMPLANT
GLOVE BIOGEL PI INDICATOR 7.0 (GLOVE) ×4
GLOVE ECLIPSE 6.5 STRL STRAW (GLOVE) ×3 IMPLANT
GOWN STRL REUS W/ TWL LRG LVL3 (GOWN DISPOSABLE) ×2 IMPLANT
GOWN STRL REUS W/TWL LRG LVL3 (GOWN DISPOSABLE) ×4
GRAFT DERMACARRIERS 1 TO 1.5 (DISPOSABLE) IMPLANT
HYDROGEN PEROXIDE 16OZ (MISCELLANEOUS) IMPLANT
LIQUID BAND (GAUZE/BANDAGES/DRESSINGS) IMPLANT
NEEDLE PRECISIONGLIDE 27X1.5 (NEEDLE) ×3 IMPLANT
NS IRRIG 1000ML POUR BTL (IV SOLUTION) ×3 IMPLANT
PACK BASIN DAY SURGERY FS (CUSTOM PROCEDURE TRAY) ×3 IMPLANT
PAD CAST 3X4 CTTN HI CHSV (CAST SUPPLIES) IMPLANT
PAD CAST 4YDX4 CTTN HI CHSV (CAST SUPPLIES) IMPLANT
PADDING CAST COTTON 3X4 STRL (CAST SUPPLIES)
PADDING CAST COTTON 4X4 STRL (CAST SUPPLIES)
PENCIL BUTTON HOLSTER BLD 10FT (ELECTRODE) IMPLANT
SHEET MEDIUM DRAPE 40X70 STRL (DRAPES) ×6 IMPLANT
SPONGE GAUZE 4X4 12PLY STER LF (GAUZE/BANDAGES/DRESSINGS) IMPLANT
SPONGE LAP 18X18 X RAY DECT (DISPOSABLE) ×3 IMPLANT
STAPLER VISISTAT 35W (STAPLE) IMPLANT
STOCKINETTE 4X48 STRL (DRAPES) IMPLANT
STOCKINETTE 6  STRL (DRAPES) ×2
STOCKINETTE 6 STRL (DRAPES) ×1 IMPLANT
STOCKINETTE IMPERVIOUS LG (DRAPES) IMPLANT
STRIP CLOSURE SKIN 1/2X4 (GAUZE/BANDAGES/DRESSINGS) ×2 IMPLANT
SURGILUBE 2OZ TUBE FLIPTOP (MISCELLANEOUS) IMPLANT
SUT CHROMIC 4 0 PS 2 18 (SUTURE) IMPLANT
SUT CHROMIC 5 0 P 3 (SUTURE) IMPLANT
SUT MNCRL AB 4-0 PS2 18 (SUTURE) IMPLANT
SUT PLAIN 5 0 P 3 18 (SUTURE) ×6 IMPLANT
SUT SILK 3 0 SH CR/8 (SUTURE) IMPLANT
SUT SILK 4 0 SH CR/8 (SUTURE) IMPLANT
SUT VIC AB 5-0 P-3 18X BRD (SUTURE) IMPLANT
SUT VIC AB 5-0 P3 18 (SUTURE)
SYR BULB 3OZ (MISCELLANEOUS) ×3 IMPLANT
SYR CONTROL 10ML LL (SYRINGE) ×3 IMPLANT
TISSUE THERASKIN 2X3 (Tissue) ×6 IMPLANT
TOWEL OR 17X24 6PK STRL BLUE (TOWEL DISPOSABLE) ×3 IMPLANT
TRAY DSU PREP LF (CUSTOM PROCEDURE TRAY) ×6 IMPLANT
TUBE CONNECTING 20'X1/4 (TUBING) ×1
TUBE CONNECTING 20X1/4 (TUBING) ×2 IMPLANT
UNDERPAD 30X30 (UNDERPADS AND DIAPERS) ×3 IMPLANT
YANKAUER SUCT BULB TIP NO VENT (SUCTIONS) ×3 IMPLANT

## 2015-02-14 NOTE — Anesthesia Postprocedure Evaluation (Addendum)
Anesthesia Post Note  Patient: [name]  Procedure(s) Performed: Procedure(s) (LRB): SURGICAL PREP FOR GRAFTING RIGHT LOWER EXTREMITY AND APPLICATION OF THERASKIN (Right)  Anesthesia type: general  Patient location: PACU  Post pain: Pain level controlled  Post assessment: Patient's Cardiovascular Status Stable  Last Vitals:  Filed Vitals:   02/14/15 1300  BP: 107/56  Pulse: 65  Temp:   Resp: 17    Post vital signs: Reviewed and stable  Level of consciousness: sedated  Complications: Some low [id number] in recovery, will admit overnight.

## 2015-02-14 NOTE — Discharge Instructions (Signed)

## 2015-02-14 NOTE — Anesthesia Preprocedure Evaluation (Addendum)
Anesthesia Evaluation  Patient identified by MRN, date of birth, ID band Patient awake    Reviewed: Allergy & Precautions, NPO status , Patient's Chart, lab work & pertinent test results  History of Anesthesia Complications Negative for: history of anesthetic complications  Airway Mallampati: II  TM Distance: >3 FB Neck ROM: Full    Dental  (+) Dental Advisory Given, Teeth Intact   Pulmonary neg pulmonary ROS,    Pulmonary exam normal        Cardiovascular negative cardio ROS Normal cardiovascular exam     Neuro/Psych negative neurological ROS  negative psych ROS   GI/Hepatic negative GI ROS, Neg liver ROS,   Endo/Other  negative endocrine ROS  Renal/GU negative Renal ROS     Musculoskeletal   Abdominal   Peds negative pediatric ROS (+)  Hematology  (+) Sickle cell anemia ,   Anesthesia Other Findings   Reproductive/Obstetrics                            Anesthesia Physical Anesthesia Plan  ASA: II  Anesthesia Plan: General   Post-op Pain Management:    Induction: Intravenous  Airway Management Planned: LMA  Additional Equipment:   Intra-op Plan:   Post-operative Plan: Extubation in OR  Informed Consent: I have reviewed the patients History and Physical, chart, labs and discussed the procedure including the risks, benefits and alternatives for the proposed anesthesia with the patient or authorized representative who has indicated his/her understanding and acceptance.   Dental advisory given  Plan Discussed with: CRNA, Anesthesiologist and Surgeon  Anesthesia Plan Comments:        Anesthesia Quick Evaluation

## 2015-02-14 NOTE — Interval H&P Note (Signed)
History and Physical Interval Note:  02/14/2015 6:49 AM  [name]  has presented today for surgery, with the diagnosis of NON PRESSURE ULCER RIGHT ANKLE, SICKLE CELL DISEASE  The various methods of treatment have been discussed with the patient and family. After consideration of risks, benefits and other options for treatment, the patient has consented to  Procedure(s): SURGICAL PREP FOR GRAFTING RIGHT LOWER EXTREMITY AND APPLICATION OF THERASKIN (Right) as a surgical intervention .  The patient's history has been reviewed, patient examined, no change in status, stable for surgery.  I have reviewed the patient's chart and labs.  Questions were answered to the patient's satisfaction.     THIMMAPPA, BRINDA

## 2015-02-14 NOTE — Op Note (Signed)
Operative Note   DATE OF OPERATION: 9.27.2016  LOCATION: [facility]- outpatient  SURGICAL DIVISION: Plastic Surgery  PREOPERATIVE DIAGNOSES:  1. 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 Sickle cell anemia  POSTOPERATIVE DIAGNOSES:  same  PROCEDURE:  1. Surgical preparation for grafting right ankle 50 cm2 2. Application Theraskin total 78 cm2  SURGEON: Irene Limbo MD MBA  ASSISTANT: none  ANESTHESIA:  General.   EBL: minimal  COMPLICATIONS: None.   INDICATIONS FOR PROCEDURE:  The patient, [name], is a 44 y.o. female born on Jun 15, 1970, is here for debridement and application skin substitute to right ankle ulcers present for at least 2 years that has failed to respond to local wound care. Patient has significant associated pain and is unable to tolerate significant debridement in clinic. Patient has underlying sickle cell disease with chronic anemia.    FINDINGS: Medial malleolar ulceration, measured as a cluster 6 x 5 x .01 cm. Lateral malleolar ulceration, measured as a cluster 9.5 x 6 x 0.1 cm  DESCRIPTION OF PROCEDURE:  The patient's operative site was marked with the patient in the preoperative area. The patient was taken to the operating room. IV antibiotics were given. The patient's operative site was prepped and draped in a sterile fashion. A time out was performed and all information was confirmed to be correct. Local anesthetic infiltrated surrounding both medial and lateral ankle ulcerations. Curettage performed to remove all slough from ulcers to remove skin edges of ulcers to bleeding tissue. Wound irrigated. Theraskin prepared and applied over entire area of ulceration of medial malleolus. This was secured with running 4-0 plain gun. A second graft of Theraskin was prepared for lateral malleolar ulcer and similarly inset. The excess was trimmed. Benzoin applied to peri wound and adaptic guaze placed over Theraskin and secured  to skin with steri strips. Dry guaze, kerlix and Ace wrap applied.   The patient was allowed to wake from anesthesia, extubated and taken to the recovery room in satisfactory condition.   SPECIMENS: none  DRAINS: none  Irene Limbo, MD [facility] Plastic & Reconstructive Surgery [phone]

## 2015-02-14 NOTE — Transfer of Care (Signed)
Immediate Anesthesia Transfer of Care Note  Patient: [name]  Procedure(s) Performed: Procedure(s): SURGICAL PREP FOR GRAFTING RIGHT LOWER EXTREMITY AND APPLICATION OF THERASKIN (Right)  Patient Location: PACU  Anesthesia Type:General  Level of Consciousness: sedated  Airway & Oxygen Therapy: Patient Spontanous Breathing and Patient connected to face mask oxygen  Post-op Assessment: Report given to RN and Post -op Vital signs reviewed and stable  Post vital signs: Reviewed and stable  Last Vitals:  Filed Vitals:   02/14/15 1021  BP:   Pulse: 85  Temp:   Resp: 23    Complications: No apparent anesthesia complications

## 2015-02-14 NOTE — Anesthesia Procedure Notes (Signed)
Procedure Name: LMA Insertion Date/Time: 02/14/2015 9:24 AM Performed by: Melynda Ripple D Pre-anesthesia Checklist: Patient identified, Emergency Drugs available, Suction available and Patient being monitored Patient Re-evaluated:Patient Re-evaluated prior to inductionOxygen Delivery Method: Circle System Utilized Preoxygenation: Pre-oxygenation with 100% oxygen Intubation Type: IV induction Ventilation: Mask ventilation without difficulty LMA: LMA inserted LMA Size: 4.0 Number of attempts: 1 Airway Equipment and Method: Bite block Placement Confirmation: positive ETCO2 Tube secured with: Tape Dental Injury: Teeth and Oropharynx as per pre-operative assessment

## 2015-02-15 ENCOUNTER — Encounter (HOSPITAL_BASED_OUTPATIENT_CLINIC_OR_DEPARTMENT_OTHER): Payer: Self-pay | Admitting: Plastic Surgery

## 2015-02-15 DIAGNOSIS — L97311 Non-pressure chronic ulcer of right ankle limited to breakdown of skin: Secondary | ICD-10-CM | POA: Diagnosis not present

## 2015-02-15 MED ORDER — HYDROMORPHONE HCL 1 MG/ML IJ SOLN
INTRAMUSCULAR | Status: AC
  Filled 2015-02-15: qty 1

## 2015-02-15 MED ORDER — HYDROMORPHONE HCL 1 MG/ML PO LIQD
1.0000 mg | Freq: Once | ORAL | Status: AC
  Administered 2015-02-15: 1 mg via ORAL

## 2015-02-20 ENCOUNTER — Encounter (HOSPITAL_BASED_OUTPATIENT_CLINIC_OR_DEPARTMENT_OTHER): Payer: Medicaid Other | Attending: Plastic Surgery

## 2015-02-20 ENCOUNTER — Other Ambulatory Visit: Payer: Self-pay | Admitting: Family Medicine

## 2015-02-20 ENCOUNTER — Other Ambulatory Visit: Payer: Self-pay | Admitting: Hematology

## 2015-02-20 DIAGNOSIS — L97311 Non-pressure chronic ulcer of right ankle limited to breakdown of skin: Secondary | ICD-10-CM | POA: Diagnosis present

## 2015-02-20 DIAGNOSIS — D571 Sickle-cell disease 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: Secondary | ICD-10-CM | POA: Diagnosis not present

## 2015-02-20 DIAGNOSIS — L97811 Non-pressure chronic ulcer of other part of right lower leg limited to breakdown of skin: Secondary | ICD-10-CM | POA: Insufficient documentation

## 2015-02-20 DIAGNOSIS — G629 Polyneuropathy, unspecified: Secondary | ICD-10-CM | POA: Diagnosis not present

## 2015-02-20 MED ORDER — OXYCODONE-ACETAMINOPHEN 5-325 MG PO TABS: 1.0000 | ORAL_TABLET | ORAL | Status: DC | PRN

## 2015-02-20 NOTE — Telephone Encounter (Signed)
Medication refill request for Oxycodone / LOV 11/25/2014 /

## 2015-02-20 NOTE — Telephone Encounter (Signed)
Needs OV. Has been almost 2 months.

## 2015-02-27 DIAGNOSIS — L97811 Non-pressure chronic ulcer of other part of right lower leg limited to breakdown of skin: Secondary | ICD-10-CM | POA: Diagnosis not present

## 2015-02-27 DIAGNOSIS — L97311 Non-pressure chronic ulcer of right ankle limited to breakdown of skin: Secondary | ICD-10-CM | POA: Diagnosis not present

## 2015-02-27 DIAGNOSIS — G629 Polyneuropathy, unspecified: Secondary | ICD-10-CM | POA: Diagnosis not present

## 2015-02-27 DIAGNOSIS — D571 Sickle-cell disease 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: Secondary | ICD-10-CM | POA: Diagnosis not present

## 2015-03-01 ENCOUNTER — Ambulatory Visit (INDEPENDENT_AMBULATORY_CARE_PROVIDER_SITE_OTHER): Payer: Medicaid Other | Admitting: Family Medicine

## 2015-03-01 ENCOUNTER — Encounter: Payer: Self-pay | Admitting: Family Medicine

## 2015-03-01 VITALS — BP 115/59 | HR 69 | Temp 97.9°F | Resp 16 | Ht 67.0 in | Wt 125.0 lb

## 2015-03-01 DIAGNOSIS — D571 Sickle-cell disease 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: Secondary | ICD-10-CM | POA: Diagnosis not present

## 2015-03-01 DIAGNOSIS — L97319 Non-pressure chronic ulcer of right ankle with unspecified severity: Secondary | ICD-10-CM | POA: Diagnosis not present

## 2015-03-01 DIAGNOSIS — Z23 Encounter for immunization: Secondary | ICD-10-CM

## 2015-03-01 DIAGNOSIS — L97311 Non-pressure chronic ulcer of right ankle limited to breakdown of skin: Secondary | ICD-10-CM | POA: Diagnosis not present

## 2015-03-01 DIAGNOSIS — G8929 Other chronic pain: Secondary | ICD-10-CM

## 2015-03-01 LAB — POCT URINALYSIS DIP (DEVICE)
Bilirubin Urine: NEGATIVE
Glucose, UA: NEGATIVE mg/dL
Ketones, ur: NEGATIVE mg/dL
LEUKOCYTES UA: NEGATIVE
Nitrite: NEGATIVE
Protein, ur: NEGATIVE mg/dL
SPECIFIC GRAVITY, URINE: 1.01 (ref 1.005–1.030)
UROBILINOGEN UA: 0.2 mg/dL (ref 0.0–1.0)
pH: 6 (ref 5.0–8.0)

## 2015-03-01 LAB — COMPLETE METABOLIC PANEL WITH GFR
ALBUMIN: 4.6 g/dL (ref 3.6–5.1)
ALK PHOS: 41 U/L (ref 33–115)
ALT: 18 U/L (ref 6–29)
AST: 36 U/L — AB (ref 10–30)
BILIRUBIN TOTAL: 4 mg/dL — AB (ref 0.2–1.2)
BUN: 5 mg/dL — AB (ref 7–25)
CO2: 25 mmol/L (ref 20–31)
CREATININE: 0.35 mg/dL — AB (ref 0.50–1.10)
Calcium: 9.1 mg/dL (ref 8.6–10.2)
Chloride: 106 mmol/L (ref 98–110)
GFR, Est African American: >89 mL/min (ref >=60)
GLUCOSE: 83 mg/dL (ref 65–99)
Potassium: 3.8 mmol/L (ref 3.5–5.3)
SODIUM: 139 mmol/L (ref 135–146)
TOTAL PROTEIN: 6.9 g/dL (ref 6.1–8.1)

## 2015-03-01 LAB — LACTATE DEHYDROGENASE: LDH: 410 U/L — AB (ref 94–250)

## 2015-03-01 MED ORDER — OXYCODONE-ACETAMINOPHEN 10-325 MG PO TABS: 1.0000 | ORAL_TABLET | ORAL | Status: DC | PRN

## 2015-03-01 MED ORDER — FOLIC ACID 1 MG PO TABS: 1.0000 mg | ORAL_TABLET | Freq: Every day | ORAL | Status: DC

## 2015-03-01 MED ORDER — IBUPROFEN 600 MG PO TABS: 600.0000 mg | ORAL_TABLET | Freq: Three times a day (TID) | ORAL | Status: DC | PRN

## 2015-03-01 NOTE — Progress Notes (Signed)
Subjective:    Patient ID: [name], female    DOB: [birth year], 44 y.o.   MRN: [id number]  HPI [name], a 44 year old female with a history of sickle cell anemia, HbSS presents for a 3 month follow up. Patient reports fatigue and right lower extremity pain related to sickle cell anemia. Patient has right lower extremity ulcers. She received a skin grafting on 02/14/2015 by Dr. Irene [name]. She is following up with the wound center weekly for dressing changes. She states that pain intensity has increased since having ulcers. Patient states that she has been consistently taking Percocet 5-325 mg every 4 hours for pain. She is currently out of pain medications. She states that she has been unable to keep up with her 43 year old and 43 year old due to increased pain. Pain is described as constant and throbbing. She denies headache, chest pain, shortness of breath, nausea, vomiting diarrhea, or constipation.   Past Medical History  Diagnosis Date  . Sickle cell disease   . Anemia 03/30/2012    Hx of sickle cell disease  . Leg ulcer 10/27/2012    Chronic under care of wound clinic  . Cholelithiasis y-1  . CAP (community acquired pneumonia)     2014  . Anemia complicating pregnancy 2/95/1884   Immunization History  Administered Date(s) Administered  . Influenza,inj,Quad PF,36+ Mos 03/16/2013, 01/20/2014  . Pneumococcal Polysaccharide-23 03/24/2014  . Tdap 11/25/2013   Social History   Social History  . Marital Status: Married    Spouse Name: [name]  . Number of Children: 1  . Years of Education: N/A   Occupational History  . Employed in home care.    Social History Main Topics  . Smoking status: Never Smoker   . Smokeless tobacco: Never Used  . Alcohol Use: No  . Drug Use: No  . Sexual Activity: Not Currently    Birth Control/ Protection: None   Other Topics Concern  . Not on file   Social History Narrative   Lives with husband.  No Known  Allergies  Review of Systems  Constitutional: Positive for fatigue. Negative for fever.  HENT: Negative.   Eyes: Negative for visual disturbance.  Respiratory: Negative.   Cardiovascular: Negative.   Gastrointestinal: Negative.   Endocrine: Negative.  Negative for polydipsia, polyphagia and polyuria.  Musculoskeletal: Positive for myalgias.       Pain to right lower extremity  Skin: Negative.        Right lower extremity ulcers  Neurological: Negative.   Hematological: Negative.   Psychiatric/Behavioral: Negative.        Objective:   Physical Exam  Constitutional: She is oriented to person, place, and time. She appears well-developed and well-nourished.  HENT:  Head: Normocephalic and atraumatic.  Right Ear: External ear normal.  Left Ear: External ear normal.  Nose: Nose normal.  Mouth/Throat: Oropharynx is clear and moist.  Eyes: Conjunctivae and EOM are normal. Pupils are equal, round, and reactive to light.  Neck: Normal range of motion. Neck supple.  Cardiovascular: Normal rate, normal heart sounds and intact distal pulses.   Pulmonary/Chest: Effort normal and breath sounds normal.  Abdominal: Soft. Bowel sounds are normal.  Musculoskeletal: Normal range of motion.  Neurological: She is alert and oriented to person, place, and time. She has normal reflexes.  Skin: Skin is warm and dry.  Wounds to right lower extremity, unable to evaluate due to extensive wrapping. (Viewed ulcers on patient's cell phone)  Psychiatric: She has a normal mood and affect. Her behavior is normal. Judgment and thought content normal.      BP 115/59 mmHg  Pulse 69  Temp(Src) 97.9 F (36.6 C) (Oral)  Resp 16  Ht 5\' 7"  (1.702 m)  Wt 125 lb (56.7 kg)  BMI 19.57 kg/m2  LMP 02/21/2015     Assessment & Plan:  1. Hb-SS disease 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 ([place])  -Reviewed previous labs, hemoglobin 5.5. Will check CBC on today. Patient also has increased fatigue. If hemoglobin remains below 6, will  bring to day infusion center for 1 unit of packed red blood cells.   -Continue folic acid 1 mg daily to prevent aplastic bone marrow crises.   -Pulmonary evaluation - Patient denies severe recurrent wheezes, shortness of breath with exercise, or persistent cough. If these symptoms develop, pulmonary function tests with spirometry will be ordered, and if abnormal, plan on referral to Pulmonology for further evaluation.  -Cardiac - Routine screening for pulmonary hypertension is not recommended.  - Eye - High risk of proliferative retinopathy. Annual eye exam with retinal exam recommended to patient.  -. Immunization status - Patient will receive influenza vaccination today.   -Acute and chronic painful episodes -  Increased dosage of pain medication to Percocet 10-325 mg every 4 hours as needed for moderate to severe chronic pain. [name] has been having increased pain since stasis ulcers have resurfaced. She is currently having dressings changes at the wound center weekly. We will discuss titration of current dosage during next office visit.  We discussed that pt is to receive her Schedule II prescriptions only from [place]. Pt is also aware that the prescription history is available to [place] online through the [facility] CSRS. Controlled substance agreement signed previously. We reminded [name] that all patients receiving Schedule II narcotics must be seen for follow within one month of prescription being requested. We reviewed the terms of our pain agreement, including the need to keep medicines in a safe locked location away from children or pets, and the need to report excess sedation or constipation, measures to avoid constipation, and policies related to early refills and stolen prescriptions. According to the [place] [facility] program, we have reviewed details related to analgesia, adverse effects, aberrant behaviors.  - Iron overload from chronic transfusion.  Will review ferritin level.    -Vitamin D deficiency -Will check vitamin D levels  - Lactate Dehydrogenase - Reticulocytes - Ferritin - Urinalysis Dipstick - Vitamin D, 25-hydroxy - CBC with Differential - COMPLETE METABOLIC PANEL WITH GFR - Hemoglobinopathy evaluation - oxyCODONE-acetaminophen (PERCOCET) 10-325 MG tablet; Take 1 tablet by mouth every 4 (four) hours as needed for pain. For severe pain greater than 7/10  Dispense: 90 tablet; Refill: 0 - folic acid (FOLVITE) 1 MG tablet; Take 1 tablet (1 mg total) by mouth daily.  Dispense: 30 tablet; Refill: 11 - ibuprofen (ADVIL,MOTRIN) 600 MG tablet; Take 1 tablet (600 mg total) by mouth every 8 (eight) hours as needed for mild pain.  Dispense: 60 tablet; Refill: 0  2. Non-pressure chronic ulcer of ankle, right, with unspecified severity [facility]) Ms. Sorrels had a skin grafting of the right lower extremity on 02/14/2015. She currently has weekly dressing changes. She states that she has increased pain to right lower extremity.  3. Chronic pain  - oxyCODONE-acetaminophen (PERCOCET) 10-325 MG tablet; Take 1 tablet by mouth every 4 (four) hours as needed for pain. For severe pain greater than 7/10  Dispense: 90 tablet; Refill:  0  4. Need for immunization against influenza  - Flu Vaccine QUAD 36+ mos IM (Fluarix)   RTC: 1 month for sickle cell anemia and chronic pain management The patient was given clear instructions to go to ER or return to medical center if symptoms do not improve, worsen or new problems develop. The patient verbalized understanding. Will notify patient with laboratory results.   [name], FNP

## 2015-03-01 NOTE — Patient Instructions (Signed)
Sickle Cell Anemia, Adult Sickle cell anemia is a condition in which red blood cells have an abnormal "sickle" shape. This abnormal shape shortens the cells' life span, which results in a lower than normal concentration of red blood cells in the blood. The sickle shape also causes the cells to clump together and block free blood flow through the blood vessels. As a result, the tissues and organs of the body do not receive enough oxygen. Sickle cell anemia causes organ damage and pain and increases the risk of infection. CAUSES  Sickle cell anemia is a genetic disorder. Those who receive two copies of the gene have the condition, and those who receive one copy have the trait. RISK FACTORS The sickle cell gene is most common in people whose families originated in [place]. Other areas of the globe where sickle cell trait occurs include the Mediterranean, South and [place], the [place], and the [place].  SIGNS AND SYMPTOMS  Pain, especially in the extremities, back, chest, or abdomen (common). The pain may start suddenly or may develop following an illness, especially if there is dehydration. Pain can also occur due to overexertion or exposure to extreme temperature changes.  Frequent severe bacterial infections, especially certain types of pneumonia and meningitis.  Pain and swelling in the hands and feet.  Decreased activity.   Loss of appetite.   Change in behavior.  Headaches.  Seizures.  Shortness of breath or difficulty breathing.  Vision changes.  Skin ulcers. Those with the trait may not have symptoms or they may have mild symptoms.  DIAGNOSIS  Sickle cell anemia is diagnosed with blood tests that demonstrate the genetic trait. It is often diagnosed during the newborn period, due to mandatory testing nationwide. A variety of blood tests, X-rays, CT scans, MRI scans, ultrasounds, and lung function tests may also be done to monitor the condition. TREATMENT  Sickle  cell anemia may be treated with:  Medicines. You may be given pain medicines, antibiotic medicines (to treat and prevent infections) or medicines to increase the production of certain types of hemoglobin.  Fluids.  Oxygen.  Blood transfusions. HOME CARE INSTRUCTIONS   Drink enough fluid to keep your urine clear or pale yellow. Increase your fluid intake in hot weather and during exercise.  Do not smoke. Smoking lowers oxygen levels in the blood.   Only take over-the-counter or prescription medicines for pain, fever, or discomfort as directed by your health care provider.  Take antibiotics as directed by your health care provider. Make sure you finish them it even if you start to feel better.   Take supplements as directed by your health care provider.   Consider wearing a medical alert bracelet. This tells anyone caring for you in an emergency of your condition.   When traveling, keep your medical information, health care provider's names, and the medicines you take with you at all times.   If you develop a fever, do not take medicines to reduce the fever right away. This could cover up a problem that is developing. Notify your health care provider.  Keep all follow-up appointments with your health care provider. Sickle cell anemia requires regular medical care. SEEK MEDICAL CARE IF: You have a fever. SEEK IMMEDIATE MEDICAL CARE IF:   You feel dizzy or faint.   You have new abdominal pain, especially on the left side near the stomach area.   You develop a persistent, often uncomfortable and painful penile erection (priapism). If this is not treated immediately it   will lead to impotence.   You have numbness your arms or legs or you have a hard time moving them.   You have a hard time with speech.   You have a fever or persistent symptoms for more than 2-3 days.   You have a fever and your symptoms suddenly get worse.   You have signs or symptoms of infection.  These include:   Chills.   Abnormal tiredness (lethargy).   Irritability.   Poor eating.   Vomiting.   You develop pain that is not helped with medicine.   You develop shortness of breath.  You have pain in your chest.   You are coughing up pus-like or bloody sputum.   You develop a stiff neck.  Your feet or hands swell or have pain.  Your abdomen appears bloated.  You develop joint pain. MAKE SURE YOU:  Understand these instructions.   This information is not intended to replace advice given to you by your health care provider. Make sure you discuss any questions you have with your health care provider.   Document Released: 08/14/2005 Document Revised: 05/27/2014 Document Reviewed: 12/16/2012 Elsevier Interactive Patient Education 2016 Elsevier Inc.  

## 2015-03-02 ENCOUNTER — Other Ambulatory Visit: Payer: Self-pay | Admitting: Family Medicine

## 2015-03-02 ENCOUNTER — Ambulatory Visit (HOSPITAL_COMMUNITY)
Admission: RE | Admit: 2015-03-02 | Discharge: 2015-03-02 | Disposition: A | Discharge status: Home / Self Care | Payer: Medicaid Other | Source: Ambulatory Visit | Attending: Internal Medicine | Admitting: Internal Medicine

## 2015-03-02 DIAGNOSIS — D571 Sickle-cell disease 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: Secondary | ICD-10-CM | POA: Insufficient documentation

## 2015-03-02 LAB — CBC WITH DIFFERENTIAL/PLATELET
BASOS PCT: 1 % (ref 0–1)
Basophils Absolute: 0.1 10*3/uL (ref 0.0–0.1)
Eosinophils Absolute: 0.4 10*3/uL (ref 0.0–0.7)
Eosinophils Relative: 4 % (ref 0–5)
HCT: 18.9 % — ABNORMAL LOW (ref 36.0–46.0)
HEMOGLOBIN: 6.4 g/dL — AB (ref 12.0–15.0)
LYMPHS ABS: 2.8 10*3/uL (ref 0.7–4.0)
Lymphocytes Relative: 29 % (ref 12–46)
MCH: 29.4 pg (ref 26.0–34.0)
MCHC: 33.3 g/dL (ref 30.0–36.0)
MCV: 86.7 fL (ref 78.0–100.0)
MONOS PCT: 12 % (ref 3–12)
MPV: 8.9 fL (ref 8.6–12.4)
Monocytes Absolute: 1.2 10*3/uL — ABNORMAL HIGH (ref 0.1–1.0)
NEUTROS ABS: 5.2 10*3/uL (ref 1.7–7.7)
NEUTROS PCT: 54 % (ref 43–77)
Platelets: 449 10*3/uL — ABNORMAL HIGH (ref 150–400)
RBC: 2.18 MIL/uL — ABNORMAL LOW (ref 3.87–5.11)
RDW: 23 % — ABNORMAL HIGH (ref 11.5–15.5)
WBC: 9.7 10*3/uL (ref 4.0–10.5)

## 2015-03-02 LAB — VITAMIN D 25 HYDROXY (VIT D DEFICIENCY, FRACTURES): VIT D 25 HYDROXY: 18 ng/mL — AB (ref 30–100)

## 2015-03-02 LAB — RETICULOCYTES
ABS Retic: 407.7 10*3/uL — ABNORMAL HIGH (ref 19.0–186.0)
RBC.: 2.18 MIL/uL — ABNORMAL LOW (ref 3.87–5.11)
RETIC CT PCT: 18.7 % — AB (ref 0.4–2.3)

## 2015-03-02 LAB — FERRITIN: Ferritin: 632 ng/mL — ABNORMAL HIGH (ref 10–291)

## 2015-03-02 NOTE — Progress Notes (Addendum)
Pt arrived for blood draw for type and screen; no complications noted; pt notified to keep blood band on arm until receives blood transfusion; pt verbalizes understanding

## 2015-03-03 ENCOUNTER — Ambulatory Visit (HOSPITAL_COMMUNITY)
Admission: RE | Admit: 2015-03-03 | Discharge: 2015-03-03 | Disposition: A | Discharge status: Home / Self Care | Payer: Medicaid Other | Source: Ambulatory Visit | Attending: Internal Medicine | Admitting: Internal Medicine

## 2015-03-03 DIAGNOSIS — D571 Sickle-cell disease 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: Secondary | ICD-10-CM | POA: Diagnosis not present

## 2015-03-03 LAB — HEMOGLOBINOPATHY EVALUATION
Hemoglobin Other: 0 %
Hgb A2 Quant: 3.9 % — ABNORMAL HIGH (ref 2.2–3.2)
Hgb A: 0 % — ABNORMAL LOW (ref 96.8–97.8)
Hgb F Quant: 7 % — ABNORMAL HIGH (ref 0.0–2.0)
Hgb S Quant: 89.1 % — ABNORMAL HIGH

## 2015-03-03 LAB — HEMOGLOBIN AND HEMATOCRIT, BLOOD
HEMATOCRIT: 21.8 % — AB (ref 36.0–46.0)
HEMOGLOBIN: 7.9 g/dL — AB (ref 12.0–15.0)

## 2015-03-03 MED ORDER — SODIUM CHLORIDE 0.9 % IV SOLN: Freq: Once | INTRAVENOUS | Status: DC

## 2015-03-03 MED ORDER — ACETAMINOPHEN 325 MG PO TABS
650.0000 mg | ORAL_TABLET | Freq: Once | ORAL | Status: AC
  Administered 2015-03-03: 650 mg via ORAL
  Filled 2015-03-03: qty 2

## 2015-03-03 MED ORDER — DIPHENHYDRAMINE HCL 25 MG PO CAPS
25.0000 mg | ORAL_CAPSULE | Freq: Once | ORAL | Status: AC
  Administered 2015-03-03: 25 mg via ORAL
  Filled 2015-03-03: qty 1

## 2015-03-03 MED ORDER — SODIUM CHLORIDE 0.9 % IV SOLN
Freq: Once | INTRAVENOUS | Status: AC
  Administered 2015-03-03: 13:00:00 via INTRAVENOUS

## 2015-03-03 NOTE — Progress Notes (Signed)
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

## 2015-03-04 LAB — TYPE AND SCREEN
ABO/RH(D): B POS
ANTIBODY SCREEN: NEGATIVE
UNIT DIVISION: 0

## 2015-03-05 ENCOUNTER — Other Ambulatory Visit: Payer: Self-pay | Admitting: Family Medicine

## 2015-03-05 DIAGNOSIS — E559 Vitamin D deficiency, unspecified: Secondary | ICD-10-CM

## 2015-03-05 MED ORDER — VITAMIN D 50 MCG (2000 UT) PO CAPS: 1.0000 | ORAL_CAPSULE | Freq: Every day | ORAL | Status: DC

## 2015-03-06 NOTE — Progress Notes (Signed)
Spoke with patient, informed her of low vitamin D and to take 2000 IU otc daily as directed. Patient verbalized understanding and had no other questions at this time. Thanks!

## 2015-03-13 DIAGNOSIS — G629 Polyneuropathy, unspecified: Secondary | ICD-10-CM | POA: Diagnosis not present

## 2015-03-13 DIAGNOSIS — D571 Sickle-cell disease 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: Secondary | ICD-10-CM | POA: Diagnosis not present

## 2015-03-13 DIAGNOSIS — L97311 Non-pressure chronic ulcer of right ankle limited to breakdown of skin: Secondary | ICD-10-CM | POA: Diagnosis not present

## 2015-03-13 DIAGNOSIS — L97811 Non-pressure chronic ulcer of other part of right lower leg limited to breakdown of skin: Secondary | ICD-10-CM | POA: Diagnosis not present

## 2015-03-21 ENCOUNTER — Encounter (HOSPITAL_BASED_OUTPATIENT_CLINIC_OR_DEPARTMENT_OTHER): Payer: Medicaid Other | Attending: General Surgery

## 2015-03-21 DIAGNOSIS — G629 Polyneuropathy, unspecified: Secondary | ICD-10-CM | POA: Insufficient documentation

## 2015-03-21 DIAGNOSIS — L97311 Non-pressure chronic ulcer of right ankle limited to breakdown of skin: Secondary | ICD-10-CM | POA: Diagnosis not present

## 2015-03-21 DIAGNOSIS — D571 Sickle-cell disease 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: Secondary | ICD-10-CM | POA: Insufficient documentation

## 2015-03-27 DIAGNOSIS — L97311 Non-pressure chronic ulcer of right ankle limited to breakdown of skin: Secondary | ICD-10-CM | POA: Diagnosis not present

## 2015-03-27 DIAGNOSIS — D571 Sickle-cell disease 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: Secondary | ICD-10-CM | POA: Diagnosis not present

## 2015-03-27 DIAGNOSIS — G629 Polyneuropathy, unspecified: Secondary | ICD-10-CM | POA: Diagnosis not present

## 2015-03-28 ENCOUNTER — Encounter: Payer: Self-pay | Admitting: Internal Medicine

## 2015-03-28 ENCOUNTER — Ambulatory Visit (INDEPENDENT_AMBULATORY_CARE_PROVIDER_SITE_OTHER): Payer: Medicaid Other | Admitting: Internal Medicine

## 2015-03-28 VITALS — BP 126/71 | HR 66 | Temp 97.3°F | Resp 18 | Ht 67.0 in | Wt 127.0 lb

## 2015-03-28 DIAGNOSIS — L97319 Non-pressure chronic ulcer of right ankle with unspecified severity: Secondary | ICD-10-CM

## 2015-03-28 DIAGNOSIS — D571 Sickle-cell disease 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: Secondary | ICD-10-CM

## 2015-03-28 DIAGNOSIS — R21 Rash and other nonspecific skin eruption: Secondary | ICD-10-CM | POA: Diagnosis not present

## 2015-03-28 MED ORDER — IBUPROFEN 600 MG PO TABS: 600.0000 mg | ORAL_TABLET | Freq: Three times a day (TID) | ORAL | Status: DC | PRN

## 2015-03-28 MED ORDER — OXYCODONE-ACETAMINOPHEN 10-325 MG PO TABS: 1.0000 | ORAL_TABLET | ORAL | Status: DC | PRN

## 2015-03-28 MED ORDER — BETAMETHASONE VALERATE 0.1 % EX OINT: 1.0000 "application " | TOPICAL_OINTMENT | Freq: Two times a day (BID) | CUTANEOUS | Status: DC

## 2015-03-28 NOTE — Progress Notes (Signed)
Patient ID: [name], female   DOB: [birth year], 44 y.o.   MRN: [id number]   [name], is a 44 y.o. female  [id number]  [id number]  DOB - 02-15-1971  Chief Complaint  Patient presents with  . Follow-up        Subjective:   [name] is a 44 y.o. female here today for a follow up visit. Patient has history of sickle cell disease and chronic anemia, recently had blood transfusion. Patient is here today for her routine sickle cell follow-up. She recently had debridement and skin grafting of chronic ulcer on her right malleolus that has not healed for quite a long time. Wound is getting better, she follows up with wound clinic, get wound dressing once a week. She has no new complaint today except for ongoing chronic pain, not unusual. 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 She does not want to take hydroxyurea for personal reasons. She said her pain is properly managed with current pain medications. She has had a flu shot this year. She is up-to-date with her eye examination. She is also up-to-date with her Pap smear and other preventative care. She does not smoke cigarettes, she does not drink alcohol. Patient has No headache, No chest pain, No abdominal pain - No Nausea, No new weakness tingling or numbness, No Cough - SOB.  Problem  Rash and Nonspecific Skin Eruption    ALLERGIES: No Known Allergies  PAST MEDICAL HISTORY: Past Medical History  Diagnosis Date  . Sickle cell disease ([place])   . Anemia 03/30/2012    Hx of sickle cell disease  . Leg ulcer ([place]) 10/27/2012    Chronic under care of wound clinic  . Cholelithiasis y-1  . CAP (community acquired pneumonia)     2014  . Anemia complicating pregnancy 2/42/3536    MEDICATIONS AT HOME: Prior to Admission medications   Medication Sig Start Date End Date Taking? Authorizing Provider  Cholecalciferol (VITAMIN D) 2000 UNITS CAPS Take 1 capsule (2,000 Units  total) by mouth daily. 03/05/15  Yes [name], FNP  folic acid (FOLVITE) 1 MG tablet Take 1 tablet (1 mg total) by mouth daily. 03/01/15  Yes [name], FNP  ibuprofen (ADVIL,MOTRIN) 600 MG tablet Take 1 tablet (600 mg total) by mouth every 8 (eight) hours as needed for mild pain. 03/28/15  Yes Tresa Garter, MD  oxyCODONE-acetaminophen (PERCOCET) 10-325 MG tablet Take 1 tablet by mouth every 4 (four) hours as needed for pain. For severe pain greater than 7/10 03/28/15  Yes Azuree Minish E Doreene Burke, MD  betamethasone valerate ointment (VALISONE) 0.1 % Apply 1 application topically 2 (two) times daily. 03/28/15   Tresa Garter, MD     Objective:   Filed Vitals:   03/28/15 0832  BP: 126/71  Pulse: 66  Temp: 97.3 F (36.3 C)  TempSrc: Oral  Resp: 18  Height: 5\' 7"  (1.702 m)  Weight: 127 lb (57.607 kg)  SpO2: 97%    Exam General appearance : Awake, alert, not in any distress. Speech Clear. Not toxic looking HEENT: Atraumatic and Normocephalic, pupils equally reactive to light and accomodation Neck: supple, no JVD. No cervical lymphadenopathy.  Chest:Good air entry bilaterally, no added sounds  CVS: S1 S2 regular, systolic murmurs grade 2 most likely related to anemia.  Abdomen: Bowel sounds present, Non tender and not distended with no gaurding, rigidity or rebound. Extremities: B/L Lower Ext shows no  edema, both legs are warm to touch Neurology: Awake alert, and oriented X 3, CN II-XII intact, Non focal Skin: Right ankle wound dressing clean and dry Multiple circular rashes on her left leg and submandibular area on the right, some hyperpigmented and scaly, submandibular rash looks like eczema  Data Review No results found for: HGBA1C   Assessment & Plan   1. Rash and nonspecific skin eruption  - betamethasone valerate ointment (VALISONE) 0.1 %; Apply 1 application topically 2 (two) times daily.  Dispense: 45 g; Refill: 2  2. Hb-SS disease 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  ([place]) Refill - ibuprofen (ADVIL,MOTRIN) 600 MG tablet; Take 1 tablet (600 mg total) by mouth every 8 (eight) hours as needed for mild pain.  Dispense: 60 tablet; Refill: 3 - oxyCODONE-acetaminophen (PERCOCET) 10-325 MG tablet; Take 1 tablet by mouth every 4 (four) hours as needed for pain. For severe pain greater than 7/10  Dispense: 90 tablet; Refill: 0  We agreed on Opiate dose and amount of pills  per month. We discussed that pt is to receive Schedule II prescriptions only from our clinic. Pt is also aware that the prescription history is available to [place] online through the [facility] CSRS. Controlled substance agreement reviewed and signed. We reminded [name] that all patients receiving Schedule II narcotics must be seen for follow within one month of prescription being requested. We reviewed the terms of our pain agreement, including the need to keep medicines in a safe locked location away from children or pets, and the need to report excess sedation or constipation, measures to avoid constipation, and policies related to early refills and stolen prescriptions. According to the [place] Chronic Pain Initiative program, we have reviewed details related to analgesia, adverse effects and aberrant behaviors.  Vitamin D deficiency - continue vitamin D capsules   3. Non-pressure chronic ulcer of ankle, right, with unspecified severity ([place]) Follow-up with wound care center Follow-up with plastic surgery Continue wound dressing  Patient have been counseled extensively about nutrition and exercise  Return in about 4 weeks (around 04/25/2015) for Sickle Cell Disease/Pain.  The patient was given clear instructions to go to ER or return to medical center if symptoms don't improve, worsen or new problems develop. The patient verbalized understanding. The patient was told to call to get lab results if they haven't heard anything in the next week.   This note has been created with Engineer, agricultural. Any transcriptional errors are unintentional.    Angelica Chessman, MD, [place], Karilyn Cota, [place] and [place], [place]   03/28/2015, 9:06 AM

## 2015-03-28 NOTE — Patient Instructions (Signed)
Sickle Cell Anemia, Adult Sickle cell anemia is a condition in which red blood cells have an abnormal "sickle" shape. This abnormal shape shortens the cells' life span, which results in a lower than normal concentration of red blood cells in the blood. The sickle shape also causes the cells to clump together and block free blood flow through the blood vessels. As a result, the tissues and organs of the body do not receive enough oxygen. Sickle cell anemia causes organ damage and pain and increases the risk of infection. CAUSES  Sickle cell anemia is a genetic disorder. Those who receive two copies of the gene have the condition, and those who receive one copy have the trait. RISK FACTORS The sickle cell gene is most common in people whose families originated in [place]. Other areas of the globe where sickle cell trait occurs include the Mediterranean, South and [place], the [place], and the [place].  SIGNS AND SYMPTOMS  Pain, especially in the extremities, back, chest, or abdomen (common). The pain may start suddenly or may develop following an illness, especially if there is dehydration. Pain can also occur due to overexertion or exposure to extreme temperature changes.  Frequent severe bacterial infections, especially certain types of pneumonia and meningitis.  Pain and swelling in the hands and feet.  Decreased activity.   Loss of appetite.   Change in behavior.  Headaches.  Seizures.  Shortness of breath or difficulty breathing.  Vision changes.  Skin ulcers. Those with the trait may not have symptoms or they may have mild symptoms.  DIAGNOSIS  Sickle cell anemia is diagnosed with blood tests that demonstrate the genetic trait. It is often diagnosed during the newborn period, due to mandatory testing nationwide. A variety of blood tests, X-rays, CT scans, MRI scans, ultrasounds, and lung function tests may also be done to monitor the condition. TREATMENT  Sickle  cell anemia may be treated with:  Medicines. You may be given pain medicines, antibiotic medicines (to treat and prevent infections) or medicines to increase the production of certain types of hemoglobin.  Fluids.  Oxygen.  Blood transfusions. HOME CARE INSTRUCTIONS   Drink enough fluid to keep your urine clear or pale yellow. Increase your fluid intake in hot weather and during exercise.  Do not smoke. Smoking lowers oxygen levels in the blood.   Only take over-the-counter or prescription medicines for pain, fever, or discomfort as directed by your health care provider.  Take antibiotics as directed by your health care provider. Make sure you finish them it even if you start to feel better.   Take supplements as directed by your health care provider.   Consider wearing a medical alert bracelet. This tells anyone caring for you in an emergency of your condition.   When traveling, keep your medical information, health care provider's names, and the medicines you take with you at all times.   If you develop a fever, do not take medicines to reduce the fever right away. This could cover up a problem that is developing. Notify your health care provider.  Keep all follow-up appointments with your health care provider. Sickle cell anemia requires regular medical care. SEEK MEDICAL CARE IF: You have a fever. SEEK IMMEDIATE MEDICAL CARE IF:   You feel dizzy or faint.   You have new abdominal pain, especially on the left side near the stomach area.   You develop a persistent, often uncomfortable and painful penile erection (priapism). If this is not treated immediately it   will lead to impotence.   You have numbness your arms or legs or you have a hard time moving them.   You have a hard time with speech.   You have a fever or persistent symptoms for more than 2-3 days.   You have a fever and your symptoms suddenly get worse.   You have signs or symptoms of infection.  These include:   Chills.   Abnormal tiredness (lethargy).   Irritability.   Poor eating.   Vomiting.   You develop pain that is not helped with medicine.   You develop shortness of breath.  You have pain in your chest.   You are coughing up pus-like or bloody sputum.   You develop a stiff neck.  Your feet or hands swell or have pain.  Your abdomen appears bloated.  You develop joint pain. MAKE SURE YOU:  Understand these instructions.   This information is not intended to replace advice given to you by your health care provider. Make sure you discuss any questions you have with your health care provider.   Document Released: 08/14/2005 Document Revised: 05/27/2014 Document Reviewed: 12/16/2012 Elsevier Interactive Patient Education 2016 Elsevier Inc.  

## 2015-03-28 NOTE — Progress Notes (Signed)
Patient here for one month follow-up.  Patient complains of pain in right ankle. Patient has ace bandage around ankle. Ankle was injured in June. Pain scaled at a 7, described as a burning pain. Patient states she is treated at the wound center for her ankle.

## 2015-04-10 DIAGNOSIS — D571 Sickle-cell disease 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: Secondary | ICD-10-CM | POA: Diagnosis not present

## 2015-04-10 DIAGNOSIS — L97311 Non-pressure chronic ulcer of right ankle limited to breakdown of skin: Secondary | ICD-10-CM | POA: Diagnosis not present

## 2015-04-10 DIAGNOSIS — G629 Polyneuropathy, unspecified: Secondary | ICD-10-CM | POA: Diagnosis not present

## 2015-04-24 ENCOUNTER — Encounter (HOSPITAL_BASED_OUTPATIENT_CLINIC_OR_DEPARTMENT_OTHER): Payer: Medicaid Other | Attending: Plastic Surgery

## 2015-04-24 DIAGNOSIS — G629 Polyneuropathy, unspecified: Secondary | ICD-10-CM | POA: Insufficient documentation

## 2015-04-24 DIAGNOSIS — D571 Sickle-cell disease 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: Secondary | ICD-10-CM | POA: Insufficient documentation

## 2015-04-24 DIAGNOSIS — L97311 Non-pressure chronic ulcer of right ankle limited to breakdown of skin: Secondary | ICD-10-CM | POA: Diagnosis not present

## 2015-04-28 ENCOUNTER — Encounter: Payer: Self-pay | Admitting: Family Medicine

## 2015-04-28 ENCOUNTER — Ambulatory Visit (INDEPENDENT_AMBULATORY_CARE_PROVIDER_SITE_OTHER): Payer: Medicaid Other | Admitting: Family Medicine

## 2015-04-28 VITALS — BP 118/63 | HR 71 | Temp 97.5°F | Resp 14 | Ht 67.0 in | Wt 126.0 lb

## 2015-04-28 DIAGNOSIS — G894 Chronic pain syndrome: Secondary | ICD-10-CM | POA: Diagnosis not present

## 2015-04-28 DIAGNOSIS — L97319 Non-pressure chronic ulcer of right ankle with unspecified severity: Secondary | ICD-10-CM

## 2015-04-28 DIAGNOSIS — D571 Sickle-cell disease 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: Secondary | ICD-10-CM | POA: Diagnosis not present

## 2015-04-28 MED ORDER — OXYCODONE-ACETAMINOPHEN 10-325 MG PO TABS: 1.0000 | ORAL_TABLET | ORAL | Status: DC | PRN

## 2015-04-28 NOTE — Progress Notes (Signed)
Subjective:    Patient ID: [name], female    DOB: 15-Apr-1971, 44 y.o.   MRN: [id number]  HPI [name], a 44 year old female with a history of sickle cell anemia, HbSS presents for a 63month follow up. Patient reports that she has mild pain to bilateral lower extremities. She maintains that pain intensity is 2/10. She last had Percocet 10-325 mg on last night with moderate relief. Patient has right lower extremity ulcers. She received a skin grafting several months ago by Dr. Irene [name]. She is following up with the wound center weekly for dressing changes. She denies headache, chest pain, shortness of breath, nausea, vomiting diarrhea, or constipation.   Past Medical History  Diagnosis Date  . Sickle cell disease ([place])   . Anemia 03/30/2012    Hx of sickle cell disease  . Leg ulcer ([place]) 10/27/2012    Chronic under care of wound clinic  . Cholelithiasis y-1  . CAP (community acquired pneumonia)     2014  . Anemia complicating pregnancy [id number]   Immunization History  Administered Date(s) Administered  . Influenza,inj,Quad PF,36+ Mos 03/16/2013, 01/20/2014, 03/01/2015  . Pneumococcal Polysaccharide-23 03/24/2014  . Tdap 11/25/2013   Social History   Social History  . Marital Status: Married    Spouse Name: [name]  . Number of Children: 1  . Years of Education: N/A   Occupational History  . Employed in home care.    Social History Main Topics  . Smoking status: Never Smoker   . Smokeless tobacco: Never Used  . Alcohol Use: No  . Drug Use: No  . Sexual Activity: Not Currently    Birth Control/ Protection: None   Other Topics Concern  . Not on file   Social History Narrative   Lives with husband.  No Known Allergies  Review of Systems  Constitutional: Positive for fatigue. Negative for fever.  HENT: Negative.   Eyes: Negative for visual disturbance.  Respiratory: Negative.   Cardiovascular: Negative.   Gastrointestinal:  Negative.   Endocrine: Negative.  Negative for polydipsia, polyphagia and polyuria.  Musculoskeletal: Positive for myalgias.       Pain to right lower extremity  Skin: Negative.        Right lower extremity ulcers  Neurological: Negative.   Hematological: Negative.   Psychiatric/Behavioral: Negative.        Objective:   Physical Exam  Constitutional: She is oriented to person, place, and time. She appears well-developed and well-nourished.  HENT:  Head: Normocephalic and atraumatic.  Right Ear: External ear normal.  Left Ear: External ear normal.  Nose: Nose normal.  Mouth/Throat: Oropharynx is clear and moist.  Eyes: Conjunctivae and EOM are normal. Pupils are equal, round, and reactive to light.  Neck: Normal range of motion. Neck supple.  Cardiovascular: Normal rate, normal heart sounds and intact distal pulses.   Pulmonary/Chest: Effort normal and breath sounds normal.  Abdominal: Soft. Bowel sounds are normal.  Musculoskeletal: Normal range of motion.  Neurological: She is alert and oriented to person, place, and time. She has normal reflexes.  Skin: Skin is warm and dry.  Wounds to right lower extremity, unable to evaluate due to extensive wrapping.   Psychiatric: She has a normal mood and affect. Her behavior is normal. Judgment and thought content normal.      BP 118/63 mmHg  Pulse 71  Temp(Src) 97.5 F (36.4 C) (Oral)  Resp 14  Ht 5\' 7"  (1.702 m)  Wt 126 lb (  57.153 kg)  BMI 19.73 kg/m2  LMP 04/19/2015     Assessment & Plan:  1. Hb-SS disease 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 ([place]) Acute and chronic painful episodes -  Increased dosage of pain medication to Percocet 10-325 mg every 4 hours as needed for moderate to severe chronic pain. [name] has been having increased pain since stasis ulcers have resurfaced. She is currently having dressings changes at the wound center weekly. We will discuss titration of current dosage during next office visit.  We discussed that pt is to  receive her Schedule II prescriptions only from [place]. Pt is also aware that the prescription history is available to [place] online through the [facility] CSRS. Controlled substance agreement signed previously. We reminded [name] that all patients receiving Schedule II narcotics must be seen for follow within one month of prescription being requested. We reviewed the terms of our pain agreement, including the need to keep medicines in a safe locked location away from children or pets, and the need to report excess sedation or constipation, measures to avoid constipation, and policies related to early refills and stolen prescriptions. According to the [place] Chronic Pain Initiative program, we have reviewed details related to analgesia, adverse effects, aberrant behaviors. Reviewed [place] Substance Reporting system prior to reorder  - oxyCODONE-acetaminophen (PERCOCET) 10-325 MG tablet; Take 1 tablet by mouth every 4 (four) hours as needed for pain. For severe pain greater than 7/10  Dispense: 90 tablet; Refill: 0 - CBC with Differential - Reticulocytes 2. Non-pressure chronic ulcer of ankle, right, with unspecified severity ([place])  She currently has weekly dressing changes. She states that she has periodic pain to right lower extremity.  3. Chronic pain  - oxyCODONE-acetaminophen (PERCOCET) 10-325 MG tablet; Take 1 tablet by mouth every 4 (four) hours as needed for pain. For severe pain greater than 7/10  Dispense: 90 tablet; Refill: 0   RTC: 1 month for sickle cell anemia and chronic pain management  The patient was given clear instructions to go to ER or return to medical center if symptoms do not improve, worsen or new problems develop. The patient verbalized understanding. Will notify patient with laboratory results.   [name], FNP

## 2015-04-29 LAB — CBC WITH DIFFERENTIAL/PLATELET
BASOS ABS: 0.2 10*3/uL — AB (ref 0.0–0.1)
BASOS PCT: 2 % — AB (ref 0–1)
Eosinophils Absolute: 0.4 10*3/uL (ref 0.0–0.7)
Eosinophils Relative: 4 % (ref 0–5)
HEMATOCRIT: 20.7 % — AB (ref 36.0–46.0)
HEMOGLOBIN: 7.1 g/dL — AB (ref 12.0–15.0)
LYMPHS PCT: 28 % (ref 12–46)
Lymphs Abs: 2.5 10*3/uL (ref 0.7–4.0)
MCH: 29.5 pg (ref 26.0–34.0)
MCHC: 34.3 g/dL (ref 30.0–36.0)
MCV: 85.9 fL (ref 78.0–100.0)
MONO ABS: 0.9 10*3/uL (ref 0.1–1.0)
MPV: 8.9 fL (ref 8.6–12.4)
Monocytes Relative: 10 % (ref 3–12)
NEUTROS ABS: 5 10*3/uL (ref 1.7–7.7)
Neutrophils Relative %: 56 % (ref 43–77)
Platelets: 513 10*3/uL — ABNORMAL HIGH (ref 150–400)
RBC: 2.41 MIL/uL — AB (ref 3.87–5.11)
RDW: 22.3 % — AB (ref 11.5–15.5)
WBC: 9 10*3/uL (ref 4.0–10.5)

## 2015-04-29 LAB — RETICULOCYTES
ABS RETIC: 474.8 10*3/uL — AB (ref 19.0–186.0)
RBC.: 2.41 MIL/uL — AB (ref 3.87–5.11)
RETIC CT PCT: 19.7 % — AB (ref 0.4–2.3)

## 2015-05-18 DIAGNOSIS — D571 Sickle-cell disease 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: Secondary | ICD-10-CM | POA: Diagnosis not present

## 2015-05-18 DIAGNOSIS — G629 Polyneuropathy, unspecified: Secondary | ICD-10-CM | POA: Diagnosis not present

## 2015-05-18 DIAGNOSIS — L97311 Non-pressure chronic ulcer of right ankle limited to breakdown of skin: Secondary | ICD-10-CM | POA: Diagnosis not present

## 2015-05-30 ENCOUNTER — Ambulatory Visit: Payer: Medicaid Other | Admitting: Family Medicine

## 2015-06-01 ENCOUNTER — Encounter (HOSPITAL_BASED_OUTPATIENT_CLINIC_OR_DEPARTMENT_OTHER): Payer: Medicaid Other | Attending: Internal Medicine

## 2015-06-01 DIAGNOSIS — Z872 Personal history of diseases of the skin and subcutaneous tissue: Secondary | ICD-10-CM | POA: Insufficient documentation

## 2015-06-01 DIAGNOSIS — D571 Sickle-cell disease 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: Secondary | ICD-10-CM | POA: Insufficient documentation

## 2015-06-01 DIAGNOSIS — Z09 Encounter for follow-up examination after completed treatment for conditions other than malignant neoplasm: Secondary | ICD-10-CM | POA: Insufficient documentation

## 2015-06-08 DIAGNOSIS — Z872 Personal history of diseases of the skin and subcutaneous tissue: Secondary | ICD-10-CM | POA: Diagnosis not present

## 2015-06-08 DIAGNOSIS — D571 Sickle-cell disease 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: Secondary | ICD-10-CM | POA: Diagnosis not present

## 2015-06-08 DIAGNOSIS — Z09 Encounter for follow-up examination after completed treatment for conditions other than malignant neoplasm: Secondary | ICD-10-CM | POA: Diagnosis present

## 2015-06-27 MED FILL — BETAMETHASONE 0.1% OINTMENT: 0.1 | 15 days supply | Qty: 45 | Fill #1

## 2015-06-28 ENCOUNTER — Ambulatory Visit (INDEPENDENT_AMBULATORY_CARE_PROVIDER_SITE_OTHER): Payer: Medicaid Other | Admitting: Family Medicine

## 2015-06-28 VITALS — BP 118/68 | HR 67 | Temp 98.3°F | Ht 67.0 in | Wt 125.0 lb

## 2015-06-28 DIAGNOSIS — L97319 Non-pressure chronic ulcer of right ankle with unspecified severity: Secondary | ICD-10-CM | POA: Diagnosis not present

## 2015-06-28 DIAGNOSIS — G894 Chronic pain syndrome: Secondary | ICD-10-CM | POA: Diagnosis not present

## 2015-06-28 DIAGNOSIS — D571 Sickle-cell disease 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: Secondary | ICD-10-CM | POA: Diagnosis not present

## 2015-06-28 DIAGNOSIS — R809 Proteinuria, unspecified: Secondary | ICD-10-CM

## 2015-06-28 LAB — CBC WITH DIFFERENTIAL/PLATELET
BASOS ABS: 0.2 10*3/uL — AB (ref 0.0–0.1)
BASOS PCT: 3 % — AB (ref 0–1)
EOS ABS: 0.2 10*3/uL (ref 0.0–0.7)
Eosinophils Relative: 2 % (ref 0–5)
HCT: 19.2 % — ABNORMAL LOW (ref 36.0–46.0)
Hemoglobin: 6.7 g/dL — CL (ref 12.0–15.0)
LYMPHS ABS: 2.3 10*3/uL (ref 0.7–4.0)
Lymphocytes Relative: 29 % (ref 12–46)
MCH: 30.7 pg (ref 26.0–34.0)
MCHC: 34.9 g/dL (ref 30.0–36.0)
MCV: 88.1 fL (ref 78.0–100.0)
MPV: 9 fL (ref 8.6–12.4)
Monocytes Absolute: 1 10*3/uL (ref 0.1–1.0)
Monocytes Relative: 13 % — ABNORMAL HIGH (ref 3–12)
NEUTROS PCT: 53 % (ref 43–77)
Neutro Abs: 4.2 10*3/uL (ref 1.7–7.7)
PLATELETS: 413 10*3/uL — AB (ref 150–400)
RBC: 2.18 MIL/uL — AB (ref 3.87–5.11)
RDW: 24 % — ABNORMAL HIGH (ref 11.5–15.5)
WBC: 7.9 10*3/uL (ref 4.0–10.5)

## 2015-06-28 LAB — POCT URINALYSIS DIP (DEVICE)
Bilirubin Urine: NEGATIVE
GLUCOSE, UA: NEGATIVE mg/dL
Ketones, ur: NEGATIVE mg/dL
Leukocytes, UA: NEGATIVE
Nitrite: NEGATIVE
PROTEIN: 30 mg/dL — AB
Specific Gravity, Urine: 1.015 (ref 1.005–1.030)
UROBILINOGEN UA: 2 mg/dL — AB (ref 0.0–1.0)
pH: 6 (ref 5.0–8.0)

## 2015-06-28 LAB — RETICULOCYTES: RBC.: 2.18 MIL/uL — AB (ref 3.87–5.11)

## 2015-06-28 MED ORDER — OXYCODONE-ACETAMINOPHEN 10-325 MG PO TABS: 1.0000 | ORAL_TABLET | ORAL | Status: DC | PRN

## 2015-06-28 MED FILL — IBUPROFEN 600 MG TABLET: 600 | 20 days supply | Qty: 60 | Fill #2

## 2015-06-28 MED FILL — OXYCODONE/APAP 10/325 MG TA: 10-325 | 15 days supply | Qty: 90 | Fill #0

## 2015-06-28 NOTE — Patient Instructions (Signed)
Sickle Cell Anemia, Adult Sickle cell anemia is a condition in which red blood cells have an abnormal "sickle" shape. This abnormal shape shortens the cells' life span, which results in a lower than normal concentration of red blood cells in the blood. The sickle shape also causes the cells to clump together and block free blood flow through the blood vessels. As a result, the tissues and organs of the body do not receive enough oxygen. Sickle cell anemia causes organ damage and pain and increases the risk of infection. CAUSES  Sickle cell anemia is a genetic disorder. Those who receive two copies of the gene have the condition, and those who receive one copy have the trait. RISK FACTORS The sickle cell gene is most common in people whose families originated in [place]. Other areas of the globe where sickle cell trait occurs include the Mediterranean, South and [place], the [place], and the [place].  SIGNS AND SYMPTOMS  Pain, especially in the extremities, back, chest, or abdomen (common). The pain may start suddenly or may develop following an illness, especially if there is dehydration. Pain can also occur due to overexertion or exposure to extreme temperature changes.  Frequent severe bacterial infections, especially certain types of pneumonia and meningitis.  Pain and swelling in the hands and feet.  Decreased activity.   Loss of appetite.   Change in behavior.  Headaches.  Seizures.  Shortness of breath or difficulty breathing.  Vision changes.  Skin ulcers. Those with the trait may not have symptoms or they may have mild symptoms.  DIAGNOSIS  Sickle cell anemia is diagnosed with blood tests that demonstrate the genetic trait. It is often diagnosed during the newborn period, due to mandatory testing nationwide. A variety of blood tests, X-rays, CT scans, MRI scans, ultrasounds, and lung function tests may also be done to monitor the condition. TREATMENT  Sickle  cell anemia may be treated with:  Medicines. You may be given pain medicines, antibiotic medicines (to treat and prevent infections) or medicines to increase the production of certain types of hemoglobin.  Fluids.  Oxygen.  Blood transfusions. HOME CARE INSTRUCTIONS   Drink enough fluid to keep your urine clear or pale yellow. Increase your fluid intake in hot weather and during exercise.  Do not smoke. Smoking lowers oxygen levels in the blood.   Only take over-the-counter or prescription medicines for pain, fever, or discomfort as directed by your health care provider.  Take antibiotics as directed by your health care provider. Make sure you finish them it even if you start to feel better.   Take supplements as directed by your health care provider.   Consider wearing a medical alert bracelet. This tells anyone caring for you in an emergency of your condition.   When traveling, keep your medical information, health care provider's names, and the medicines you take with you at all times.   If you develop a fever, do not take medicines to reduce the fever right away. This could cover up a problem that is developing. Notify your health care provider.  Keep all follow-up appointments with your health care provider. Sickle cell anemia requires regular medical care. SEEK MEDICAL CARE IF: You have a fever. SEEK IMMEDIATE MEDICAL CARE IF:   You feel dizzy or faint.   You have new abdominal pain, especially on the left side near the stomach area.   You develop a persistent, often uncomfortable and painful penile erection (priapism). If this is not treated immediately it   will lead to impotence.   You have numbness your arms or legs or you have a hard time moving them.   You have a hard time with speech.   You have a fever or persistent symptoms for more than 2-3 days.   You have a fever and your symptoms suddenly get worse.   You have signs or symptoms of infection.  These include:   Chills.   Abnormal tiredness (lethargy).   Irritability.   Poor eating.   Vomiting.   You develop pain that is not helped with medicine.   You develop shortness of breath.  You have pain in your chest.   You are coughing up pus-like or bloody sputum.   You develop a stiff neck.  Your feet or hands swell or have pain.  Your abdomen appears bloated.  You develop joint pain. MAKE SURE YOU:  Understand these instructions.   This information is not intended to replace advice given to you by your health care provider. Make sure you discuss any questions you have with your health care provider.   Document Released: 08/14/2005 Document Revised: 05/27/2014 Document Reviewed: 12/16/2012 Elsevier Interactive Patient Education 2016 Elsevier Inc.  

## 2015-06-28 NOTE — Progress Notes (Signed)
Subjective:    Patient ID: [name], female    DOB: 1971/02/01, 45 y.o.   MRN: [id number]  HPI [name], a 45 year old female with a history of sickle cell anemia, HbSS presents for a 27month follow up. Patient reports that she has moderate pain to bilateral lower extremities. She maintains that pain intensity is 5/10. She last had Percocet 10-325 mg on last night with satisfactory relief. Patient has right lower extremity ulcers. She received a skin grafting several months ago. She states that she has been released from the wound center.  She denies headache, chest pain, shortness of breath, nausea, vomiting diarrhea, or constipation.   Past Medical History  Diagnosis Date  . Sickle cell disease ([place])   . Anemia 03/30/2012    Hx of sickle cell disease  . Leg ulcer ([place]) 10/27/2012    Chronic under care of wound clinic  . Cholelithiasis y-1  . CAP (community acquired pneumonia)     2014  . Anemia complicating pregnancy [id number]   Immunization History  Administered Date(s) Administered  . Influenza,inj,Quad PF,36+ Mos 03/16/2013, 01/20/2014, 03/01/2015  . Pneumococcal Polysaccharide-23 03/24/2014  . Tdap 11/25/2013   Social History   Social History  . Marital Status: Married    Spouse Name: [name]  . Number of Children: 1  . Years of Education: N/A   Occupational History  . Employed in home care.    Social History Main Topics  . Smoking status: Never Smoker   . Smokeless tobacco: Never Used  . Alcohol Use: No  . Drug Use: No  . Sexual Activity: Not Currently    Birth Control/ Protection: None   Other Topics Concern  . Not on file   Social History Narrative   Lives with husband.  No Known Allergies  Review of Systems  Constitutional: Positive for fatigue. Negative for fever.  HENT: Negative.   Eyes: Negative for visual disturbance.  Respiratory: Negative.   Cardiovascular: Negative.   Gastrointestinal: Negative.   Endocrine:  Negative.  Negative for polydipsia, polyphagia and polyuria.  Musculoskeletal: Positive for myalgias.       Pain to right lower extremity  Skin: Negative.        Right lower extremity ulcers  Neurological: Negative.   Hematological: Negative.   Psychiatric/Behavioral: Negative.        Objective:   Physical Exam  Constitutional: She is oriented to person, place, and time. She appears well-developed and well-nourished.  HENT:  Head: Normocephalic and atraumatic.  Right Ear: External ear normal.  Left Ear: External ear normal.  Nose: Nose normal.  Mouth/Throat: Oropharynx is clear and moist.  Eyes: Conjunctivae and EOM are normal. Pupils are equal, round, and reactive to light.  Neck: Normal range of motion. Neck supple.  Cardiovascular: Normal rate, normal heart sounds and intact distal pulses.   Pulmonary/Chest: Effort normal and breath sounds normal.  Abdominal: Soft. Bowel sounds are normal.  Musculoskeletal: Normal range of motion.  Neurological: She is alert and oriented to person, place, and time. She has normal reflexes.  Skin: Skin is warm and dry.  Wounds to right lower extremity, unable to evaluate due to extensive wrapping.   Psychiatric: She has a normal mood and affect. Her behavior is normal. Judgment and thought content normal.      BP 118/68 mmHg  Pulse 67  Temp(Src) 98.3 F (36.8 C) (Oral)  Ht 5\' 7"  (1.702 m)  Wt 125 lb (56.7 kg)  BMI 19.57 kg/m2  LMP 06/13/2015     Assessment & Plan:  1. Hb-SS disease 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 ([place]) Acute and chronic painful episodes -  Will continue Percocet 10-325 mg every 4 hours as needed for moderate to severe chronic pain. [name] has been having increased pain since stasis ulcers have resurfaced. She is currently having dressings changes at the wound center weekly. We will discuss titration of current dosage during next office visit.  We discussed that pt is to receive her Schedule II prescriptions only from [place]. Pt is  also aware that the prescription history is available to [place] online through the [facility] CSRS. Controlled substance agreement signed previously. We reminded [name] that all patients receiving Schedule II narcotics must be seen for follow within one month of prescription being requested. We reviewed the terms of our pain agreement, including the need to keep medicines in a safe locked location away from children or pets, and the need to report excess sedation or constipation, measures to avoid constipation, and policies related to early refills and stolen prescriptions. According to the [place] Chronic Pain Initiative program, we have reviewed details related to analgesia, adverse effects, aberrant behaviors. Reviewed [place] Substance Reporting system prior to reorder - POCT urinalysis dipstick - CBC with Differential - Reticulocytes - oxyCODONE-acetaminophen (PERCOCET) 10-325 MG tablet; Take 1 tablet by mouth every 4 (four) hours as needed for pain. For severe pain greater than 7/10  Dispense: 90 tablet; Refill: 0  2. Chronic pain syndrome - oxyCODONE-acetaminophen (PERCOCET) 10-325 MG tablet; Take 1 tablet by mouth every 4 (four) hours as needed for pain. For severe pain greater than 7/10  Dispense: 90 tablet; Refill: 0   3. Non-pressure chronic ulcer of ankle, right, with unspecified severity ([place]) [name] has been having increased pain since stasis ulcers have resurfaced.  RTC: 1 month for sickle cell anemia and chronic pain management  The patient was given clear instructions to go to ER or return to medical center if symptoms do not improve, worsen or new problems develop. The patient verbalized understanding. Will notify patient with laboratory results.   [name] Dew, FNP

## 2015-06-29 ENCOUNTER — Telehealth: Payer: Self-pay | Admitting: Family Medicine

## 2015-06-29 ENCOUNTER — Encounter: Payer: Self-pay | Admitting: Family Medicine

## 2015-06-29 ENCOUNTER — Other Ambulatory Visit: Payer: Self-pay | Admitting: Family Medicine

## 2015-06-29 ENCOUNTER — Telehealth: Payer: Self-pay | Admitting: Hematology

## 2015-06-29 LAB — MICROALBUMIN / CREATININE URINE RATIO
CREATININE, URINE: 68 mg/dL (ref 20–320)
MICROALB/CREAT RATIO: 150 ug/mg{creat} — AB (ref <30)
Microalb, Ur: 10.2 mg/dL

## 2015-06-29 NOTE — Telephone Encounter (Signed)
[name] from [facility] lab called critical results:  Hemoglobin: 6.7  read back and verified / [place] Holl[place], NP notified verbally and already aware.

## 2015-06-29 NOTE — Telephone Encounter (Signed)
[name], a 45 year old female with a history of sickle cell anemia, HbSS has a critical hemoglobin level of 6.7. Patient has been having increased pain to right lower extremity due to a chronic non-pressure ulcer of the right ankle. I will schedule a type and cross match for 1 unit of packed red blood cells. Will check a hemoglobin and hematocrit 1 hour post transfusion.    [name], FNP

## 2015-06-30 ENCOUNTER — Ambulatory Visit (HOSPITAL_COMMUNITY)
Admission: RE | Admit: 2015-06-30 | Discharge: 2015-06-30 | Disposition: A | Discharge status: Home / Self Care | Payer: Medicaid Other | Source: Ambulatory Visit | Attending: Internal Medicine | Admitting: Internal Medicine

## 2015-06-30 DIAGNOSIS — D5700 Hb-SS disease with crisis, unspecified: Secondary | ICD-10-CM | POA: Insufficient documentation

## 2015-06-30 LAB — HEMOGLOBIN AND HEMATOCRIT, BLOOD
HCT: 22.4 % — ABNORMAL LOW (ref 36.0–46.0)
Hemoglobin: 7.8 g/dL — ABNORMAL LOW (ref 12.0–15.0)

## 2015-06-30 LAB — PREPARE RBC (CROSSMATCH)

## 2015-06-30 MED ORDER — SODIUM CHLORIDE 0.9 % IV SOLN
Freq: Once | INTRAVENOUS | Status: AC
  Administered 2015-06-30: 12:00:00 via INTRAVENOUS

## 2015-06-30 NOTE — Progress Notes (Signed)
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  Post procedure: Pt alert, oriented and ambulatory after procedure.

## 2015-07-02 LAB — TYPE AND SCREEN
ABO/RH(D): B POS
Antibody Screen: NEGATIVE
UNIT DIVISION: 0

## 2015-07-26 ENCOUNTER — Ambulatory Visit: Payer: Medicaid Other | Admitting: Family Medicine

## 2015-08-24 ENCOUNTER — Ambulatory Visit: Payer: Medicaid Other | Admitting: Family Medicine

## 2015-08-25 ENCOUNTER — Ambulatory Visit (INDEPENDENT_AMBULATORY_CARE_PROVIDER_SITE_OTHER): Payer: Medicaid Other | Admitting: Family Medicine

## 2015-08-25 ENCOUNTER — Encounter: Payer: Self-pay | Admitting: Family Medicine

## 2015-08-25 VITALS — BP 126/71 | HR 86 | Temp 98.1°F | Resp 16 | Ht 67.0 in | Wt 119.0 lb

## 2015-08-25 DIAGNOSIS — L97319 Non-pressure chronic ulcer of right ankle with unspecified severity: Secondary | ICD-10-CM | POA: Diagnosis not present

## 2015-08-25 DIAGNOSIS — D571 Sickle-cell disease 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: Secondary | ICD-10-CM | POA: Diagnosis not present

## 2015-08-25 LAB — COMPLETE METABOLIC PANEL WITH GFR
ALT: 20 U/L (ref 6–29)
AST: 29 U/L (ref 10–30)
Albumin: 4.5 g/dL (ref 3.6–5.1)
Alkaline Phosphatase: 46 U/L (ref 33–115)
BILIRUBIN TOTAL: 3.5 mg/dL — AB (ref 0.2–1.2)
BUN: 8 mg/dL (ref 7–25)
CALCIUM: 9.5 mg/dL (ref 8.6–10.2)
CO2: 22 mmol/L (ref 20–31)
CREATININE: 0.36 mg/dL — AB (ref 0.50–1.10)
Chloride: 104 mmol/L (ref 98–110)
GFR, Est Non African American: >89 mL/min (ref >=60)
Glucose, Bld: 85 mg/dL (ref 65–99)
Potassium: 3.9 mmol/L (ref 3.5–5.3)
Sodium: 136 mmol/L (ref 135–146)
Total Protein: 7.6 g/dL (ref 6.1–8.1)

## 2015-08-25 LAB — CBC WITH DIFFERENTIAL/PLATELET
BASOS ABS: 128 {cells}/uL (ref 0–200)
Basophils Relative: 1 %
EOS ABS: 640 {cells}/uL — AB (ref 15–500)
Eosinophils Relative: 5 %
HEMATOCRIT: 21.3 % — AB (ref 35.0–45.0)
Hemoglobin: 7.2 g/dL — ABNORMAL LOW (ref 11.7–15.5)
LYMPHS PCT: 29 %
Lymphs Abs: 3712 cells/uL (ref 850–3900)
MCH: 29.3 pg (ref 27.0–33.0)
MCHC: 33.8 g/dL (ref 32.0–36.0)
MCV: 86.6 fL (ref 80.0–100.0)
MONO ABS: 1664 {cells}/uL — AB (ref 200–950)
MPV: 9 fL (ref 7.5–12.5)
Monocytes Relative: 13 %
NEUTROS PCT: 52 %
Neutro Abs: 6656 cells/uL (ref 1500–7800)
Platelets: 528 10*3/uL — ABNORMAL HIGH (ref 140–400)
RBC: 2.46 MIL/uL — ABNORMAL LOW (ref 3.80–5.10)
RDW: 21.8 % — AB (ref 11.0–15.0)
WBC: 12.8 10*3/uL — ABNORMAL HIGH (ref 3.8–10.8)

## 2015-08-25 LAB — POCT URINALYSIS DIP (DEVICE)
BILIRUBIN URINE: NEGATIVE
Glucose, UA: NEGATIVE mg/dL
Ketones, ur: NEGATIVE mg/dL
LEUKOCYTES UA: NEGATIVE
NITRITE: NEGATIVE
PH: 6.5 (ref 5.0–8.0)
Protein, ur: 100 mg/dL — AB
SPECIFIC GRAVITY, URINE: 1.015 (ref 1.005–1.030)
Urobilinogen, UA: 0.2 mg/dL (ref 0.0–1.0)

## 2015-08-25 LAB — RETICULOCYTES
ABS Retic: 405900 cells/uL — ABNORMAL HIGH (ref 20000–80000)
RBC.: 2.46 MIL/uL — AB (ref 3.80–5.10)
Retic Ct Pct: 16.5 %

## 2015-08-25 MED ORDER — OXYCODONE-ACETAMINOPHEN 10-325 MG PO TABS: 1.0000 | ORAL_TABLET | ORAL | Status: DC | PRN

## 2015-08-25 MED FILL — BETAMETHASONE 0.1% OINTMENT: 0.1 | 15 days supply | Qty: 45 | Fill #2

## 2015-08-25 MED FILL — IBUPROFEN 600 MG TABLET: 600 | 20 days supply | Qty: 60 | Fill #3

## 2015-08-25 MED FILL — OXYCODONE-APAP 10-325 TAB: 10-325 | 15 days supply | Qty: 90 | Fill #0

## 2015-08-25 NOTE — Patient Instructions (Addendum)
Sent referral to wound care. Will follow-up by phone with laboratory results.  Recommend increasing protein intake to promote would healing.   Sickle Cell Anemia, Adult Sickle cell anemia is a condition in which red blood cells have an abnormal "sickle" shape. This abnormal shape shortens the cells' life span, which results in a lower than normal concentration of red blood cells in the blood. The sickle shape also causes the cells to clump together and block free blood flow through the blood vessels. As a result, the tissues and organs of the body do not receive enough oxygen. Sickle cell anemia causes organ damage and pain and increases the risk of infection. CAUSES  Sickle cell anemia is a genetic disorder. Those who receive two copies of the gene have the condition, and those who receive one copy have the trait. RISK FACTORS The sickle cell gene is most common in people whose families originated in [place]. Other areas of the globe where sickle cell trait occurs include the [place], [place] and [place], and the [place].  SIGNS AND SYMPTOMS  Pain, especially in the extremities, back, chest, or abdomen (common). The pain may start suddenly or may develop following an illness, especially if there is dehydration. Pain can also occur due to overexertion or exposure to extreme temperature changes.  Frequent severe bacterial infections, especially certain types of pneumonia and meningitis.  Pain and swelling in the hands and feet.  Decreased activity.   Loss of appetite.   Change in behavior.  Headaches.  Seizures.  Shortness of breath or difficulty breathing.  Vision changes.  Skin ulcers. Those with the trait may not have symptoms or they may have mild symptoms.  DIAGNOSIS  Sickle cell anemia is diagnosed with blood tests that demonstrate the genetic trait. It is often diagnosed during the newborn period, due to mandatory testing nationwide. A variety  of blood tests, X-rays, CT scans, MRI scans, ultrasounds, and lung function tests may also be done to monitor the condition. TREATMENT  Sickle cell anemia may be treated with:  Medicines. You may be given pain medicines, antibiotic medicines (to treat and prevent infections) or medicines to increase the production of certain types of hemoglobin.  Fluids.  Oxygen.  Blood transfusions. HOME CARE INSTRUCTIONS   Drink enough fluid to keep your urine clear or pale yellow. Increase your fluid intake in hot weather and during exercise.  Do not smoke. Smoking lowers oxygen levels in the blood.   Only take over-the-counter or prescription medicines for pain, fever, or discomfort as directed by your health care provider.  Take antibiotics as directed by your health care provider. Make sure you finish them it even if you start to feel better.   Take supplements as directed by your health care provider.   Consider wearing a medical alert bracelet. This tells anyone caring for you in an emergency of your condition.   When traveling, keep your medical information, health care provider's names, and the medicines you take with you at all times.   If you develop a fever, do not take medicines to reduce the fever right away. This could cover up a problem that is developing. Notify your health care provider.  Keep all follow-up appointments with your health care provider. Sickle cell anemia requires regular medical care. SEEK MEDICAL CARE IF: You have a fever. SEEK IMMEDIATE MEDICAL CARE IF:   You feel dizzy or faint.   You have new abdominal pain, especially on the left side near  the stomach area.   You develop a persistent, often uncomfortable and painful penile erection (priapism). If this is not treated immediately it will lead to impotence.   You have numbness your arms or legs or you have a hard time moving them.   You have a hard time with speech.   You have a fever or  persistent symptoms for more than 2-3 days.   You have a fever and your symptoms suddenly get worse.   You have signs or symptoms of infection. These include:   Chills.   Abnormal tiredness (lethargy).   Irritability.   Poor eating.   Vomiting.   You develop pain that is not helped with medicine.   You develop shortness of breath.  You have pain in your chest.   You are coughing up pus-like or bloody sputum.   You develop a stiff neck.  Your feet or hands swell or have pain.  Your abdomen appears bloated.  You develop joint pain. MAKE SURE YOU:  Understand these instructions.   This information is not intended to replace advice given to you by your health care provider. Make sure you discuss any questions you have with your health care provider.   Document Released: 08/14/2005 Document Revised: 05/27/2014 Document Reviewed: 12/16/2012 Elsevier Interactive Patient Education [employer].

## 2015-08-25 NOTE — Progress Notes (Signed)
Subjective:    Patient ID: [name], female    DOB: 1971/01/11, 45 y.o.   MRN: [id number]  HPI [name], a 45 year old female with a history of sickle cell anemia, HbSS presents for a 1 month follow up. Patient reports that she has severe pain to bilateral lower extremities. She maintains that pain intensity is 10/10. She last had Percocet 10-325 mg several hours ago with minimal relief. Patient has right lower extremity non pressure ulcer. She received a skin grafting several months ago. She states that ulcers have re-opened and are starting to drain.  She states that she has not followed up at the wound center over the past several months. She states that she has been applying dressings daily and cleaning with soap and water. Interventions to date has included debridement and antibiotics.   She denies headache, chest pain, shortness of breath, nausea, vomiting diarrhea, or constipation.   Past Medical History  Diagnosis Date  . Sickle cell disease ([place])   . Anemia 03/30/2012    Hx of sickle cell disease  . Leg ulcer ([place]) 10/27/2012    Chronic under care of wound clinic  . Cholelithiasis y-1  . CAP (community acquired pneumonia)     2014  . Anemia complicating pregnancy [id number]   Immunization History  Administered Date(s) Administered  . Influenza,inj,Quad PF,36+ Mos 03/16/2013, 01/20/2014, 03/01/2015  . Pneumococcal Polysaccharide-23 03/24/2014  . Tdap 11/25/2013   Social History   Social History  . Marital Status: Married    Spouse Name: [name]  . Number of Children: 1  . Years of Education: N/A   Occupational History  . Employed in home care.    Social History Main Topics  . Smoking status: Never Smoker   . Smokeless tobacco: Never Used  . Alcohol Use: No  . Drug Use: No  . Sexual Activity: Not Currently    Birth Control/ Protection: None   Other Topics Concern  . Not on file   Social History Narrative   Lives with husband.  No  Known Allergies  Review of Systems  Constitutional: Positive for fatigue. Negative for fever.  HENT: Negative.   Eyes: Negative for visual disturbance.  Respiratory: Negative.   Cardiovascular: Negative.   Gastrointestinal: Negative.   Endocrine: Negative.  Negative for polydipsia, polyphagia and polyuria.  Musculoskeletal: Positive for myalgias.       Pain to right lower extremity  Skin: Negative.        Right lower extremity ulcers  Neurological: Negative.   Hematological: Negative.   Psychiatric/Behavioral: Negative.        Objective:   Physical Exam  Constitutional: She is oriented to person, place, and time. She appears well-developed and well-nourished.  HENT:  Head: Normocephalic and atraumatic.  Right Ear: External ear normal.  Left Ear: External ear normal.  Nose: Nose normal.  Mouth/Throat: Oropharynx is clear and moist.  Eyes: Conjunctivae and EOM are normal. Pupils are equal, round, and reactive to light.  Neck: Normal range of motion. Neck supple.  Cardiovascular: Normal rate, normal heart sounds and intact distal pulses.   Pulmonary/Chest: Effort normal and breath sounds normal.  Abdominal: Soft. Bowel sounds are normal.  Musculoskeletal: Normal range of motion.  Neurological: She is alert and oriented to person, place, and time. She has normal reflexes.  Skin: Skin is warm and dry.  Wounds to right lower extremity with full thickness skin loss, however fascia is not exposed. Minimal tan drainage noted.  Psychiatric: She has a normal mood and affect. Her behavior is normal. Judgment and thought content normal.      BP 126/71 mmHg  Pulse 86  Temp(Src) 98.1 F (36.7 C) (Oral)  Resp 16  Ht 5\' 7"  (1.702 m)  Wt 119 lb (53.978 kg)  BMI 18.63 kg/m2  LMP 08/04/2015  Breastfeeding? No     Assessment & Plan:  1. Hb-SS disease 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 ([place]) Acute and chronic painful episodes -  Will continue Percocet 10-325 mg every 4 hours as needed for moderate to  severe chronic pain. [name] has been having increased pain since stasis ulcers have resurfaced. She is currently having dressings changes at the wound center weekly. We will discuss titration of current dosage during next office visit.  We discussed that pt is to receive her Schedule II prescriptions only from [place]. Pt is also aware that the prescription history is available to [place] online through the [facility] CSRS. Controlled substance agreement signed previously. We reminded [name] that all patients receiving Schedule II narcotics must be seen for follow within one month of prescription being requested. We reviewed the terms of our pain agreement, including the need to keep medicines in a safe locked location away from children or pets, and the need to report excess sedation or constipation, measures to avoid constipation, and policies related to early refills and stolen prescriptions. According to the [place] [facility] program, we have reviewed details related to analgesia, adverse effects, aberrant behaviors. Reviewed [facility] Substance Reporting system prior to prescribing opiate medications.  - oxyCODONE-acetaminophen (PERCOCET) 10-325 MG tablet; Take 1 tablet by mouth every 4 (four) hours as needed for pain. For severe pain greater than 7/10  Dispense: 90 tablet; Refill: 0 - CBC with Differential - COMPLETE METABOLIC PANEL WITH GFR - Reticulocytes   2. Non-pressure chronic ulcer of ankle, right, with unspecified severity ([place]) [name] has been having increased pain since stasis ulcers have resurfaced. She is requesting a referral to pain management. I will review cultures as results become available. Recommend that patient continue pain medication as needed.   - Wound culture - AMB referral to wound care center   RTC: 1 month for sickle cell anemia and chronic pain management. Will follow up with patient by phone with laboratory results  The patient was given clear instructions to go  to ER or return to medical center if symptoms do not improve, worsen or new problems develop. The patient verbalized understanding. Will notify patient with laboratory results.   [name], FNP

## 2015-08-28 LAB — WOUND CULTURE
Gram Stain: NONE SEEN
Gram Stain: NONE SEEN
Gram Stain: NONE SEEN
Organism ID, Bacteria: NO GROWTH

## 2015-08-31 ENCOUNTER — Ambulatory Visit: Payer: Medicaid Other | Admitting: Surgery

## 2015-09-08 ENCOUNTER — Encounter: Payer: Medicaid Other | Attending: Surgery | Admitting: Surgery

## 2015-09-08 DIAGNOSIS — L97312 Non-pressure chronic ulcer of right ankle with fat layer exposed: Secondary | ICD-10-CM | POA: Insufficient documentation

## 2015-09-08 DIAGNOSIS — D571 Sickle-cell disease 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: Secondary | ICD-10-CM | POA: Diagnosis not present

## 2015-09-08 MED FILL — DOXYCYCLINE HYCLATE 100 MG: 100 | 14 days supply | Qty: 28 | Fill #0

## 2015-09-09 NOTE — Progress Notes (Signed)
ISSABEL, STENSON ([id number]) Visit Report for 09/08/2015 Allergy List Details Patient Name: [name], [name] Date of Service: 09/08/2015 9:30 AM Medical Record Number: [id number] Patient Account Number: [id number] Date of Birth/Sex: [birth year] (45 y.o. Female) Treating RN: Carolyne Fiscal, Debi Primary Care Physician: Liston Alba Other Clinician: Referring Physician: Liston Alba Treating Physician/Extender: Frann Rider in Treatment: 0 Electronic Signature(s) Signed: 09/08/2015 5:23:57 PM By: Alric Quan Entered By: Alric Quan on 09/08/2015 09:41:10 [name] ([id number]) -------------------------------------------------------------------------------- Arrival Information Details Patient Name: [name] Date of Service: 09/08/2015 9:30 AM Medical Record Number: [id number] Patient Account Number: [id number] Date of Birth/Sex: [birth year] (45 y.o. Female) Treating RN: Carolyne Fiscal, Debi Primary Care Physician: Liston Alba Other Clinician: Referring Physician: Liston Alba Treating Physician/Extender: Frann Rider in Treatment: 0 Visit Information Patient Arrived: Ambulatory Arrival Time: 09:38 Accompanied By: self Transfer Assistance: None Patient Identification Verified: Yes Secondary Verification Process Yes Completed: Patient Requires Transmission-Based No Precautions: Patient Has Alerts: No Electronic Signature(s) Signed: 09/08/2015 5:23:57 PM By: Alric Quan Entered By: Alric Quan on 09/08/2015 09:38:25 [name] ([id number]) -------------------------------------------------------------------------------- Clinic Level of Care Assessment Details Patient Name: [name] Date of Service: 09/08/2015 9:30 AM Medical Record Number: [id number] Patient Account Number: [id number] Date of Birth/Sex: 15-Mar-1971 (45 y.o. Female) Treating RN: Carolyne Fiscal, Debi Primary Care Physician: Liston Alba Other  Clinician: Referring Physician: Liston Alba Treating Physician/Extender: Frann Rider in Treatment: 0 Clinic Level of Care Assessment Items TOOL 2 Quantity Score X - Use when only an EandM is performed on the INITIAL visit 1 0 ASSESSMENTS - Nursing Assessment / Reassessment X - General Physical Exam (combine w/ comprehensive assessment (listed just 1 20 below) when performed on new pt. evals) X - Comprehensive Assessment (HX, ROS, Risk Assessments, Wounds Hx, etc.) 1 25 ASSESSMENTS - Wound and Skin Assessment / Reassessment []  - Simple Wound Assessment / Reassessment - one wound 0 X - Complex Wound Assessment / Reassessment - multiple wounds 2 5 []  - Dermatologic / Skin Assessment (not related to wound area) 0 ASSESSMENTS - Ostomy and/or Continence Assessment and Care []  - Incontinence Assessment and Management 0 []  - Ostomy Care Assessment and Management (repouching, etc.) 0 PROCESS - Coordination of Care []  - Simple Patient / Family Education for ongoing care 0 X - Complex (extensive) Patient / Family Education for ongoing care 1 20 X - Staff obtains Programmer, systems, Records, Test Results / Process Orders 1 10 []  - Staff telephones HHA, Nursing Homes / Clarify orders / etc 0 []  - Routine Transfer to another Facility (non-emergent condition) 0 []  - Routine Hospital Admission (non-emergent condition) 0 X - New Admissions / Biomedical engineer / Ordering NPWT, Apligraf, etc. 1 15 []  - Emergency Hospital Admission (emergent condition) 0 X - Simple Discharge Coordination 1 10 [name], [name] ([id number]) []  - Complex (extensive) Discharge Coordination 0 PROCESS - Special Needs []  - Pediatric / Minor Patient Management 0 []  - Isolation Patient Management 0 []  - Hearing / Language / Visual special needs 0 []  - Assessment of Community assistance (transportation, D/C planning, etc.) 0 []  - Additional assistance / Altered mentation 0 []  - Support Surface(s) Assessment (bed,  cushion, seat, etc.) 0 INTERVENTIONS - Wound Cleansing / Measurement X - Wound Imaging (photographs - any number of wounds) 1 5 []  - Wound Tracing (instead of photographs) 0 []  - Simple Wound Measurement - one wound 0 X - Complex Wound Measurement - multiple wounds 2 5 X - Simple Wound Cleansing - one wound 1 5 []  - Complex Wound Cleansing - multiple  wounds 0 INTERVENTIONS - Wound Dressings []  - Small Wound Dressing one or multiple wounds 0 X - Medium Wound Dressing one or multiple wounds 2 15 []  - Large Wound Dressing one or multiple wounds 0 []  - Application of Medications - injection 0 INTERVENTIONS - Miscellaneous []  - External ear exam 0 []  - Specimen Collection (cultures, biopsies, blood, body fluids, etc.) 0 []  - Specimen(s) / Culture(s) sent or taken to Lab for analysis 0 []  - Patient Transfer (multiple staff / Harrel Lemon Lift / Similar devices) 0 []  - Simple Staple / Suture removal (25 or less) 0 []  - Complex Staple / Suture removal (26 or more) 0 [name], [name] ([id number]) []  - Hypo / Hyperglycemic Management (close monitor of Blood Glucose) 0 X - Ankle / Brachial Index ([name]) - do not check if billed separately 1 15 Has the patient been seen at the hospital within the last three years: Yes Total Score: 175 Level Of Care: New/Established - Level 5 Electronic Signature(s) Signed: 09/08/2015 5:23:57 PM By: Alric Quan Entered By: Alric Quan on 09/08/2015 16:13:40 [name] ([id number]) -------------------------------------------------------------------------------- Encounter Discharge Information Details Patient Name: [name] Date of Service: 09/08/2015 9:30 AM Medical Record Number: [id number] Patient Account Number: [id number] Date of Birth/Sex: 1971-03-30 (45 y.o. Female) Treating RN: Carolyne Fiscal, Debi Primary Care Physician: Liston Alba Other Clinician: Referring Physician: Liston Alba Treating Physician/Extender: Frann Rider in  Treatment: 0 Encounter Discharge Information Items Discharge Pain Level: 0 Discharge Condition: Stable Ambulatory Status: Ambulatory Discharge Destination: Home Transportation: Private Auto Accompanied By: self Schedule Follow-up Appointment: Yes Medication Reconciliation completed and provided to Patient/Care No Larrissa Stivers: Provided on Clinical Summary of Care: 09/08/2015 Form Type Recipient Paper Patient FK Electronic Signature(s) Signed: 09/08/2015 10:50:33 AM By: Ruthine Dose Entered By: Ruthine Dose on 09/08/2015 10:50:33 [name] ([id number]) -------------------------------------------------------------------------------- Lower Extremity Assessment Details Patient Name: [name] Date of Service: 09/08/2015 9:30 AM Medical Record Number: [id number] Patient Account Number: [id number] Date of Birth/Sex: [birth year] (45 y.o. Female) Treating RN: Carolyne Fiscal, Debi Primary Care Physician: Liston Alba Other Clinician: Referring Physician: Liston Alba Treating Physician/Extender: Frann Rider in Treatment: 0 Edema Assessment Assessed: [Left: No] [Right: No] Edema: [Left: No] [Right: Yes] Calf Left: Right: Point of Measurement: 35 cm From Medial Instep 30 cm 28.5 cm Ankle Left: Right: Point of Measurement: 10 cm From Medial Instep 17.5 cm 18.6 cm Vascular Assessment Pulses: Posterior Tibial Dorsalis Pedis Palpable: [Left:Yes] [Right:Yes] Doppler: [Left:Monophasic] [Right:Monophasic] Extremity colors, hair growth, and conditions: Extremity Color: [Left:Hyperpigmented] [Right:Hyperpigmented] Hair Growth on Extremity: [Left:No] [Right:No] Temperature of Extremity: [Left:Warm] [Right:Warm] Capillary Refill: [Left:> 3 seconds] [Right:> 3 seconds] Blood Pressure: Brachial: [Left:114] [Right:114] Dorsalis Pedis: 130 [Left:Dorsalis Pedis: 130] Ankle: Posterior Tibial: 120 [Left:Posterior Tibial: 1.14] [Right:1.14] Toe Nail Assessment Left:  Right: Thick: No No Discolored: No No Deformed: No No Improper Length and Hygiene: No No Electronic Signature(s) [name], [name] ([id number]) Signed: 09/08/2015 5:23:57 PM By: Alric Quan Entered By: Alric Quan on 09/08/2015 10:04:02 [name] ([id number]) -------------------------------------------------------------------------------- Multi Wound Chart Details Patient Name: [name] Date of Service: 09/08/2015 9:30 AM Medical Record Number: [id number] Patient Account Number: [id number] Date of Birth/Sex: [birth year] (45 y.o. Female) Treating RN: Carolyne Fiscal, Debi Primary Care Physician: Liston Alba Other Clinician: Referring Physician: Liston Alba Treating Physician/Extender: Frann Rider in Treatment: 0 Vital Signs Height(in): 67 Pulse(bpm): 77 Weight(lbs): 108 Blood Pressure 114/70 (mmHg): Body Mass Index(BMI): 17 Temperature(F): 97.7 Respiratory Rate 18 (breaths/min): Photos: [1:No Photos] [2:No Photos] [N/A:N/A] Wound Location: [1:Right Malleolus - Medial] [2:Right Malleolus - Lateral] [N/A:N/A]  Wounding Event: [1:Gradually Appeared] [2:Gradually Appeared] [N/A:N/A] Primary Etiology: [1:To be determined] [2:To be determined] [N/A:N/A] Comorbid History: [1:Anemia, Sickle Cell Disease] [2:Anemia, Sickle Cell Disease] [N/A:N/A] Date Acquired: [1:05/17/2015] [2:05/17/2015] [N/A:N/A] Weeks of Treatment: [1:0] [2:0] [N/A:N/A] Wound Status: [1:Open] [2:Open] [N/A:N/A] Measurements L x W x D 6x2.2x0.2 [2:12x7x0.2] [N/A:N/A] (cm) Area (cm) : [1:10.367] [2:65.973] [N/A:N/A] Volume (cm) : [1:2.073] [2:13.195] [N/A:N/A] Classification: [1:Partial Thickness] [2:Partial Thickness] [N/A:N/A] Exudate Amount: [1:Large] [2:Large] [N/A:N/A] Exudate Type: [1:Serous] [2:Serous] [N/A:N/A] Exudate Color: [1:amber] [2:amber] [N/A:N/A] Wound Margin: [1:Thickened] [2:Thickened] [N/A:N/A] Granulation Amount: [1:None Present (0%)] [2:None Present  (0%)] [N/A:N/A] Necrotic Amount: [1:Large (67-100%)] [2:Large (67-100%)] [N/A:N/A] Exposed Structures: [1:Fascia: No Fat: No Tendon: No Muscle: No Joint: No Bone: No Limited to Skin Breakdown] [2:Fascia: No Fat: No Tendon: No Muscle: No Joint: No Bone: No Limited to Skin Breakdown] [N/A:N/A] Epithelialization: [1:None] [2:None] [N/A:N/A] Periwound Skin Texture: Edema: Yes [2:Edema: Yes] [N/A:N/A] Periwound Skin Moist: Yes Moist: Yes N/A Moisture: Periwound Skin Color: No Abnormalities Noted No Abnormalities Noted N/A Temperature: No Abnormality No Abnormality N/A Tenderness on Yes Yes N/A Palpation: Wound Preparation: Ulcer Cleansing: Ulcer Cleansing: N/A Rinsed/Irrigated with Rinsed/Irrigated with Saline Saline Topical Anesthetic Topical Anesthetic Applied: Other: lidocaine Applied: Other: lidocaine 4% 4% Treatment Notes Wound #1 (Right, Medial Malleolus) 1. Cleansed with: Clean wound with Normal Saline Cleanse wound with antibacterial soap and water 2. Anesthetic Topical Lidocaine 4% cream to wound bed prior to debridement 3. Peri-wound Care: Barrier cream 4. Dressing Applied: Aquacel Ag 5. Secondary Dressing Applied ABD Pad 7. Secured with Tape 2 Layer Lite Compression System - Right Lower Extremity Wound #2 (Right, Lateral Malleolus) 1. Cleansed with: Clean wound with Normal Saline Cleanse wound with antibacterial soap and water 2. Anesthetic Topical Lidocaine 4% cream to wound bed prior to debridement 3. Peri-wound Care: Barrier cream 4. Dressing Applied: Aquacel Ag 5. Secondary Dressing Applied ABD Pad 7. Secured with Tape 2 Layer Lite Compression System - Right Lower Extremity [name], [name] ([id number]) Electronic Signature(s) Signed: 09/08/2015 5:23:57 PM By: Alric Quan Entered By: Alric Quan on 09/08/2015 16:12:32 [name]  ([id number]) -------------------------------------------------------------------------------- Multi-Disciplinary Care Plan Details Patient Name: [name] Date of Service: 09/08/2015 9:30 AM Medical Record Number: [id number] Patient Account Number: [id number] Date of Birth/Sex: 1971-04-19 (45 y.o. Female) Treating RN: Carolyne Fiscal, Debi Primary Care Physician: Liston Alba Other Clinician: Referring Physician: Liston Alba Treating Physician/Extender: Frann Rider in Treatment: 0 Active Inactive Abuse / Safety / Falls / Self Care Management Nursing Diagnoses: Potential for falls Goals: Patient will remain injury free Date Initiated: 09/08/2015 Goal Status: Active Interventions: Assess fall risk on admission and as needed Notes: Nutrition Nursing Diagnoses: Imbalanced nutrition Goals: Patient/caregiver agrees to and verbalizes understanding of need to use nutritional supplements and/or vitamins as prescribed Date Initiated: 09/08/2015 Goal Status: Active Interventions: Assess patient nutrition upon admission and as needed per policy Notes: Orientation to the Wound Care Program Nursing Diagnoses: Knowledge deficit related to the wound healing center program Goals: Patient/caregiver will verbalize understanding of the [place], [place] ([id number]) Date Initiated: 09/08/2015 Goal Status: Active Interventions: Provide education on orientation to the wound center Notes: Pain, Acute or Chronic Nursing Diagnoses: Pain, acute or chronic: actual or potential Goals: Patient will verbalize adequate pain control and receive pain control interventions during procedures as needed Date Initiated: 09/08/2015 Goal Status: Active Interventions: Assess comfort goal upon admission Complete pain assessment as per visit requirements Notes: Soft Tissue Infection Nursing Diagnoses: Impaired tissue integrity Goals: Signs and symptoms of  infection will  be recognized early to allow for prompt treatment Date Initiated: 09/08/2015 Goal Status: Active Interventions: Assess signs and symptoms of infection every visit Notes: Wound/Skin Impairment Nursing Diagnoses: Impaired tissue integrity Goals: Ulcer/skin breakdown will have a volume reduction of 30% by week 4 Date Initiated: 09/08/2015 [name] ([id number]) Goal Status: Active Ulcer/skin breakdown will have a volume reduction of 50% by week 8 Date Initiated: 09/08/2015 Goal Status: Active Ulcer/skin breakdown will have a volume reduction of 80% by week 12 Date Initiated: 09/08/2015 Goal Status: Active Interventions: Assess patient/caregiver ability to obtain necessary supplies Assess ulceration(s) every visit Notes: Electronic Signature(s) Signed: 09/08/2015 5:23:57 PM By: Alric Quan Entered By: Alric Quan on 09/08/2015 16:12:26 [name] ([id number]) -------------------------------------------------------------------------------- Pain Assessment Details Patient Name: [name] Date of Service: 09/08/2015 9:30 AM Medical Record Number: [id number] Patient Account Number: [id number] Date of Birth/Sex: 01-07-1971 (45 y.o. Female) Treating RN: Carolyne Fiscal, Debi Primary Care Physician: Liston Alba Other Clinician: Referring Physician: Liston Alba Treating Physician/Extender: Frann Rider in Treatment: 0 Active Problems Location of Pain Severity and Description of Pain Patient Has Paino Yes Site Locations Pain Location: Pain in Ulcers Rate the pain. Current Pain Level: 10 Character of Pain Describe the Pain: Burning Pain Management and Medication Current Pain Management: Electronic Signature(s) Signed: 09/08/2015 5:23:57 PM By: Alric Quan Entered By: Alric Quan on 09/08/2015 09:38:43 [name]  ([id number]) -------------------------------------------------------------------------------- Patient/Caregiver Education Details Patient Name: [name] Date of Service: 09/08/2015 9:30 AM Medical Record Number: [id number] Patient Account Number: [id number] Date of Birth/Gender: 1970-06-05 (45 y.o. Female) Treating RN: Carolyne Fiscal, Debi Primary Care Physician: Liston Alba Other Clinician: Referring Physician: Liston Alba Treating Physician/Extender: Frann Rider in Treatment: 0 Education Assessment Education Provided To: Patient Education Topics Provided Wound/Skin Impairment: Handouts: Other: change dressing as ordered Methods: Demonstration, Explain/Verbal Responses: State content correctly Electronic Signature(s) Signed: 09/08/2015 5:23:57 PM By: Alric Quan Entered By: Alric Quan on 09/08/2015 10:34:49 [name] ([id number]) -------------------------------------------------------------------------------- Wound Assessment Details Patient Name: [name] Date of Service: 09/08/2015 9:30 AM Medical Record Number: [id number] Patient Account Number: [id number] Date of Birth/Sex: [birth year] (45 y.o. Female) Treating RN: Carolyne Fiscal, Debi Primary Care Physician: Liston Alba Other Clinician: Referring Physician: Liston Alba Treating Physician/Extender: Frann Rider in Treatment: 0 Wound Status Wound Number: 1 Primary Etiology: To be determined Wound Location: Right Malleolus - Medial Wound Status: Open Wounding Event: Gradually Appeared Comorbid History: Anemia, Sickle Cell Disease Date Acquired: 05/17/2015 Weeks Of Treatment: 0 Clustered Wound: No Photos Photo Uploaded By: Alric Quan on 09/08/2015 17:00:17 Wound Measurements Length: (cm) 6 Width: (cm) 2.2 Depth: (cm) 0.2 Area: (cm) 10.367 Volume: (cm) 2.073 % Reduction in Area: % Reduction in Volume: Epithelialization: None Tunneling:  No Undermining: No Wound Description Classification: Partial Thickness Wound Margin: Thickened Exudate Amount: Large Exudate Type: Serous Exudate Color: amber Foul Odor After Cleansing: No Wound Bed Granulation Amount: None Present (0%) Exposed Structure Necrotic Amount: Large (67-100%) Fascia Exposed: No Necrotic Quality: Adherent Slough Fat Layer Exposed: No Tendon Exposed: No [name], [name] ([id number]) Muscle Exposed: No Joint Exposed: No Bone Exposed: No Limited to Skin Breakdown Periwound Skin Texture Texture Color No Abnormalities Noted: No No Abnormalities Noted: No Localized Edema: Yes Temperature / Pain Moisture Temperature: No Abnormality No Abnormalities Noted: No Tenderness on Palpation: Yes Moist: Yes Wound Preparation Ulcer Cleansing: Rinsed/Irrigated with Saline Topical Anesthetic Applied: Other: lidocaine 4%, Treatment Notes Wound #1 (Right, Medial Malleolus) 1. Cleansed with: Clean wound with Normal Saline Cleanse wound with antibacterial soap and water 2. Anesthetic Topical Lidocaine 4%  cream to wound bed prior to debridement 3. Peri-wound Care: Barrier cream 4. Dressing Applied: Aquacel Ag 5. Secondary Dressing Applied ABD Pad 7. Secured with Tape 2 Layer Lite Compression System - Right Lower Extremity Electronic Signature(s) Signed: 09/08/2015 5:23:57 PM By: Alric Quan Entered By: Alric Quan on 09/08/2015 10:10:32 [name] ([id number]) -------------------------------------------------------------------------------- Wound Assessment Details Patient Name: [name] Date of Service: 09/08/2015 9:30 AM Medical Record Number: [id number] Patient Account Number: [id number] Date of Birth/Sex: July 17, 1970 (45 y.o. Female) Treating RN: Carolyne Fiscal, Debi Primary Care Physician: Liston Alba Other Clinician: Referring Physician: Liston Alba Treating Physician/Extender: Frann Rider in Treatment: 0 Wound  Status Wound Number: 2 Primary Etiology: To be determined Wound Location: Right Malleolus - Lateral Wound Status: Open Wounding Event: Gradually Appeared Comorbid History: Anemia, Sickle Cell Disease Date Acquired: 05/17/2015 Weeks Of Treatment: 0 Clustered Wound: No Photos Photo Uploaded By: Alric Quan on 09/08/2015 17:00:23 Wound Measurements Length: (cm) 12 Width: (cm) 7 Depth: (cm) 0.2 Area: (cm) 65.973 Volume: (cm) 13.195 % Reduction in Area: % Reduction in Volume: Epithelialization: None Tunneling: No Undermining: No Wound Description Classification: Partial Thickness Wound Margin: Thickened Exudate Amount: Large Exudate Type: Serous Exudate Color: amber Wound Bed Granulation Amount: None Present (0%) Exposed Structure Necrotic Amount: Large (67-100%) Fascia Exposed: No Necrotic Quality: Adherent Slough Fat Layer Exposed: No Tendon Exposed: No [name], [name] ([id number]) Muscle Exposed: No Joint Exposed: No Bone Exposed: No Limited to Skin Breakdown Periwound Skin Texture Texture Color No Abnormalities Noted: No No Abnormalities Noted: No Localized Edema: Yes Temperature / Pain Moisture Temperature: No Abnormality No Abnormalities Noted: No Tenderness on Palpation: Yes Moist: Yes Wound Preparation Ulcer Cleansing: Rinsed/Irrigated with Saline Topical Anesthetic Applied: Other: lidocaine 4%, Treatment Notes Wound #2 (Right, Lateral Malleolus) 1. Cleansed with: Clean wound with Normal Saline Cleanse wound with antibacterial soap and water 2. Anesthetic Topical Lidocaine 4% cream to wound bed prior to debridement 3. Peri-wound Care: Barrier cream 4. Dressing Applied: Aquacel Ag 5. Secondary Dressing Applied ABD Pad 7. Secured with Tape 2 Layer Lite Compression System - Right Lower Extremity Electronic Signature(s) Signed: 09/08/2015 5:23:57 PM By: Alric Quan Entered By: Alric Quan on 09/08/2015 10:12:39 [name]  ([id number]) -------------------------------------------------------------------------------- [place] Details Patient Name: [name] Date of Service: 09/08/2015 9:30 AM Medical Record Number: [id number] Patient Account Number: [id number] Date of Birth/Sex: [birth year] (45 y.o. Female) Treating RN: Carolyne Fiscal, Debi Primary Care Physician: Liston Alba Other Clinician: Referring Physician: Liston Alba Treating Physician/Extender: Frann Rider in Treatment: 0 Vital Signs Time Taken: 09:38 Temperature (F): 97.7 Height (in): 67 Pulse (bpm): 77 Source: Stated Respiratory Rate (breaths/min): 18 Weight (lbs): 108 Blood Pressure (mmHg): 114/70 Source: Stated Reference Range: 80 - 120 mg / dl Body Mass Index (BMI): 16.9 Electronic Signature(s) Signed: 09/08/2015 5:23:57 PM By: Alric Quan Entered By: Alric Quan on 09/08/2015 09:40:28

## 2015-09-09 NOTE — Progress Notes (Addendum)
[name], [name] ([id number]) Visit Report for 09/08/2015 Chief Complaint Document Details Patient Name: [name], [name] Date of Service: 09/08/2015 9:30 AM Medical Record Number: [id number] Patient Account Number: [id number] Date of Birth/Sex: 1970/11/14 (45 y.o. Female) Treating RN: Carolyne Fiscal, Debi Primary Care Physician: Liston Alba Other Clinician: Referring Physician: Liston Alba Treating Physician/Extender: Frann Rider in Treatment: 0 Information Obtained from: Patient Chief Complaint Patient returns to the wound care center for reopened ulcer to: right ankle both medial and laterally for about 3 months now Electronic Signature(s) Signed: 09/08/2015 10:34:29 AM By: Christin Fudge MD, FACS Entered By: Christin Fudge on 09/08/2015 10:34:29 [name] ([id number]) -------------------------------------------------------------------------------- HPI Details Patient Name: [name] Date of Service: 09/08/2015 9:30 AM Medical Record Number: [id number] Patient Account Number: [id number] Date of Birth/Sex: Sep 30, 1970 (45 y.o. Female) Treating RN: Carolyne Fiscal, Debi Primary Care Physician: Liston Alba Other Clinician: Referring Physician: Liston Alba Treating Physician/Extender: Frann Rider in Treatment: 0 History of Present Illness Location: medial and lateral ankle regions on the right Quality: Patient reports experiencing a shooting pain a lot of itching to affected area(s). Severity: Patient states wound are getting worse. Duration: Patient has had the wound for > 3 months prior to seeking treatment at the wound center Timing: Pain in wound is Intermittent (comes and goes Context: The wound appeared gradually over time Modifying Factors: Other treatment(s) tried include:she was recently seen at the [facility] wound center and treated with ferrous skin Associated Signs and Symptoms: Patient reports having increase discharge. HPI  Description: 45 year old patient with a history of sickle cell anemia, pain bilateral lower extremities, right lower extremity ulcer and has a history of receiving a skin graft( Theraskin) several months ago. She has been visiting the wound center [facility] and was seen by Dr. Dellia Nims and Dr. Leland Johns. after prolonged conservator therapy between July 2016 and January 2017. She had been seen by the plastic surgeon and taken to the OR for debridement and application of Theraskin. She had 3 applications of Theraskin and was then treated with collagen. Prior to that she had a history of similar problems in 2014 and was treated conservatively. Had a reflux study done for the right lower extremity in August 2016 without reflux or DVT. Past medical history significant for sickle cell disease, anemia, leg ulcers, cholelithiasis,and has never been a smoker. Once the patient was discharged on the wound center she says within 2 or 3 weeks the problems recurred and she has been treating it conservatively. Electronic Signature(s) Signed: 09/08/2015 10:38:46 AM By: Christin Fudge MD, FACS Previous Signature: 09/08/2015 10:10:56 AM Version By: Christin Fudge MD, FACS Previous Signature: 09/08/2015 9:57:20 AM Version By: Christin Fudge MD, FACS Entered By: Christin Fudge on 09/08/2015 10:38:46 [name] ([id number]) -------------------------------------------------------------------------------- Physical Exam Details Patient Name: [name] Date of Service: 09/08/2015 9:30 AM Medical Record Number: [id number] Patient Account Number: [id number] Date of Birth/Sex: [birth year] (45 y.o. Female) Treating RN: Carolyne Fiscal, Debi Primary Care Physician: Liston Alba Other Clinician: Referring Physician: Liston Alba Treating Physician/Extender: Frann Rider in Treatment: 0 Constitutional . Pulse regular. Respirations normal and unlabored. Afebrile. . Eyes Nonicteric. Reactive to  light. Ears, Nose, Mouth, and Throat Lips, teeth, and gums WNL.Marland Kitchen Moist mucosa without lesions. Neck supple and nontender. No palpable supraclavicular or cervical adenopathy. Normal sized without goiter. Respiratory WNL. No retractions.. Cardiovascular Pedal Pulses WNL. [name] both on the right and left was 1.14. No clubbing, cyanosis or edema. Gastrointestinal (GI) Abdomen without masses or tenderness.. No liver or spleen enlargement or tenderness.. Lymphatic  No adneopathy. No adenopathy. No adenopathy. Musculoskeletal Adexa without tenderness or enlargement.. Digits and nails w/o clubbing, cyanosis, infection, petechiae, ischemia, or inflammatory conditions.. Integumentary (Hair, Skin) No suspicious lesions. No crepitus or fluctuance. No peri-wound warmth or erythema. No masses.Marland Kitchen Psychiatric Judgement and insight Intact.. No evidence of depression, anxiety, or agitation.. Notes she has significant ulcerations both medial and lateral part of the right ankle typical of subcutaneous ulcers associated with sickle cell disease. There is significant amount of slough but debridement was not possible due to significant tenderness and I was able to wash of some of the debris with moist saline gauze. Electronic Signature(s) Signed: 09/08/2015 10:39:45 AM By: Christin Fudge MD, FACS Entered By: Christin Fudge on 09/08/2015 10:39:45 [name] ([id number]) -------------------------------------------------------------------------------- Physician Orders Details Patient Name: [name] Date of Service: 09/08/2015 9:30 AM Medical Record Number: [id number] Patient Account Number: [id number] Date of Birth/Sex: December 03, 1970 (45 y.o. Female) Treating RN: Carolyne Fiscal, Debi Primary Care Physician: Liston Alba Other Clinician: Referring Physician: Liston Alba Treating Physician/Extender: Frann Rider in Treatment: 0 Verbal / Phone Orders: Yes Clinician: Pinkerton, Debi Read  Back and Verified: Yes Diagnosis Coding Wound Cleansing Wound #1 Right,Medial Malleolus o Clean wound with Normal Saline. o Cleanse wound with mild soap and water Wound #2 Right,Lateral Malleolus o Clean wound with Normal Saline. o Cleanse wound with mild soap and water Anesthetic Wound #1 Right,Medial Malleolus o Topical Lidocaine 4% cream applied to wound bed prior to debridement Wound #2 Right,Lateral Malleolus o Topical Lidocaine 4% cream applied to wound bed prior to debridement Skin Barriers/Peri-Wound Care Wound #1 Right,Medial Malleolus o Barrier cream Wound #2 Right,Lateral Malleolus o Barrier cream Primary Wound Dressing Wound #1 Right,Medial Malleolus o Aquacel Ag Wound #2 Right,Lateral Malleolus o Aquacel Ag Secondary Dressing Wound #1 Right,Medial Malleolus o ABD pad o Conform/Kerlix Wound #2 Right,Lateral Malleolus Engelbert, Emi ([id number]) o ABD pad o Conform/Kerlix Dressing Change Frequency Wound #1 Right,Medial Malleolus o Change dressing every other day. Wound #2 Right,Lateral Malleolus o Change dressing every other day. Follow-up Appointments Wound #1 Right,Medial Malleolus o Return Appointment in 1 week. Wound #2 Right,Lateral Malleolus o Return Appointment in 1 week. Edema Control Wound #1 Right,Medial Malleolus o 2 Layer Lite Compression System - Right Lower Extremity Wound #2 Right,Lateral Malleolus o 2 Layer Lite Compression System - Right Lower Extremity Patient Medications Allergies: Documentation for allergies has not been submitted Notifications Medication Indication Start End doxycycline hyclate 09/08/2015 DOSE 1 - oral 100 mg capsule - 1 capsule oral bid Electronic Signature(s) Signed: 09/12/2015 5:04:44 PM By: Alric Quan Signed: 09/13/2015 1:09:12 PM By: Christin Fudge MD, FACS Previous Signature: 09/08/2015 4:56:07 PM Version By: Christin Fudge MD, FACS Previous Signature: 09/08/2015  5:23:57 PM Version By: Alric Quan Previous Signature: 09/08/2015 10:35:50 AM Version By: Christin Fudge MD, FACS Entered By: Alric Quan on 09/12/2015 15:47:44 [name] ([id number]) -------------------------------------------------------------------------------- Prescription 09/08/2015 Patient Name: [name] Physician: Christin Fudge MD Date of Birth: 1970-08-16 NPI#: [id number] Sex: F DEA#: [id number] Phone #: [phone] License #: Patient Address: [place], [place] [address] [facility] [address] [place], [place] [address] [phone] Allergies Physician's Orders Conform/Kerlix Signature(s): Date(s): Electronic Signature(s) Signed: 09/12/2015 5:04:44 PM By: Alric Quan Signed: 09/13/2015 1:09:12 PM By: Christin Fudge MD, FACS Entered By: Alric Quan on 09/12/2015 15:47:45 [name] ([id number]) --------------------------------------------------------------------------------  Problem List Details Patient Name: [name] Date of Service: 09/08/2015 9:30 AM Medical Record Number: [id number] Patient Account Number: [id number]  Date of Birth/Sex: [birth year] (45 y.o. 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 Female) Treating RN: Carolyne Fiscal, Debi Primary Care Physician: Liston Alba Other Clinician: Referring Physician: Liston Alba Treating Physician/Extender: Frann Rider in Treatment: 0 Subjective Chief Complaint Information obtained from Patient Patient returns to the wound care center for reopened ulcer to: right ankle both medial and laterally for about 3 months now History of Present Illness (HPI) The following HPI elements were documented for the patient's wound: Location: medial and lateral ankle regions on the right Quality: Patient reports experiencing a shooting pain a lot of itching to affected area(s). Severity: Patient states wound are getting worse. Duration: Patient has had the wound for > 3 months prior to seeking treatment at the wound center Timing: Pain in wound is Intermittent (comes and goes Context: The wound appeared gradually over time Modifying Factors: Other treatment(s) tried include:she was recently seen at the [facility] wound center and treated with ferrous skin Associated Signs and Symptoms: Patient reports having increase discharge. 45 year old patient with a history of sickle cell anemia, pain bilateral lower extremities, right lower extremity ulcer and has a history of receiving a skin graft( Theraskin) several months ago. She has been visiting the wound center [facility] and was seen by Dr. Dellia Nims and Dr. Leland Johns. after prolonged conservator therapy between July 2016 and January 2017. She had been seen by the plastic surgeon and taken to the OR for debridement and application of Theraskin. She had 3 applications of Theraskin and was then treated with  collagen. Prior to that she had a history of similar problems in 2014 and was treated conservatively. Had a reflux study done for the right lower extremity in August 2016 without reflux or DVT. Past medical history significant for sickle cell disease, anemia, leg ulcers, cholelithiasis,and has never been a smoker. Once the patient was discharged on the wound center she says within 2 or 3 weeks the problems recurred and she has been treating it conservatively. Wound History Patient presents with 2 open wounds that have been present for approximately more than a year. Patient has been treating wounds in the following manner: abt cream. The wounds have been healed in the past but have re-opened. Laboratory tests have not been performed in the last month. Patient reportedly has not tested positive for an antibiotic resistant organism. Patient reportedly has not tested positive for osteomyelitis. Patient reportedly has had testing performed to evaluate circulation in the legs. Patient experiences the following problems associated with their wounds: swelling. REID, GARNO ([id number]) Patient History Information obtained from Patient, . Allergies No allergies have been documented for the patient Family History Diabetes - Mother, No family history of Cancer, Heart Disease, Hereditary Spherocytosis, Hypertension, Kidney Disease, Lung Disease, Seizures, Stroke, Thyroid Problems, Tuberculosis. Social History Never smoker, Marital Status - Married, Alcohol Use - Never, Drug Use - No History, Caffeine Use - Daily. Medical History Hematologic/Lymphatic Patient has  history of Anemia, Sickle Cell Disease Review of Systems (ROS) Constitutional Symptoms (General Health) Complains or has symptoms of Chills. Eyes The patient has no complaints or symptoms. Ear/Nose/Mouth/Throat The patient has no complaints or symptoms. Respiratory The patient has no complaints or symptoms. Cardiovascular The  patient has no complaints or symptoms. Gastrointestinal The patient has no complaints or symptoms. Endocrine The patient has no complaints or symptoms. Genitourinary The patient has no complaints or symptoms. Immunological The patient has no complaints or symptoms. Integumentary (Skin) Complains or has symptoms of Wounds. Musculoskeletal The patient has no complaints or symptoms. Neurologic The patient has no complaints or symptoms. Oncologic The patient has no complaints or symptoms. Psychiatric The patient has no complaints or symptoms. CALLIEGH, FERRA ([id number]) Medications folic acid 1 mg tablet oral 1 1 tablet oral daily oxycodone-acetaminophen 10 mg-325 mg tablet oral 1 1 tablet oral every four hours as needed ibuprofen 600 mg tablet oral 1 1 tablet oral every eight hours as needed doxycycline hyclate 100 mg capsule oral 1 1 capsule oral bid betamethasone valerate 0.1 % topical ointment topical ointment topical apply two times daily Vitamin D3 2,000 unit capsule oral 1 1 capsule oral daily Objective Constitutional Pulse regular. Respirations normal and unlabored. Afebrile. Vitals Time Taken: 9:38 AM, Height: 67 in, Source: Stated, Weight: 108 lbs, Source: Stated, BMI: 16.9, Temperature: 97.7 F, Pulse: 77 bpm, Respiratory Rate: 18 breaths/min, Blood Pressure: 114/70 mmHg. Eyes Nonicteric. Reactive to light. Ears, Nose, Mouth, and Throat Lips, teeth, and gums WNL.Marland Kitchen Moist mucosa without lesions. Neck supple and nontender. No palpable supraclavicular or cervical adenopathy. Normal sized without goiter. Respiratory WNL. No retractions.. Cardiovascular Pedal Pulses WNL. [name] both on the right and left was 1.14. No clubbing, cyanosis or edema. Gastrointestinal (GI) Abdomen without masses or tenderness.. No liver or spleen enlargement or tenderness.. Lymphatic No adneopathy. No adenopathy. No adenopathy. Musculoskeletal Adexa without tenderness or enlargement.. Digits  and nails w/o clubbing, cyanosis, infection, petechiae, ischemia, or inflammatory conditions.Marland Kitchen Psychiatric Judgement and insight Intact.. No evidence of depression, anxiety, or agitation.Marland Kitchen BLAKELEY, AKRAM ([id number]) General Notes: she has significant ulcerations both medial and lateral part of the right ankle typical of subcutaneous ulcers associated with sickle cell disease. There is significant amount of slough but debridement was not possible due to significant tenderness and I was able to wash of some of the debris with moist saline gauze. Integumentary (Hair, Skin) No suspicious lesions. No crepitus or fluctuance. No peri-wound warmth or erythema. No masses.. Wound #1 status is Open. Original cause of wound was Gradually Appeared. The wound is located on the Right,Medial Malleolus. The wound measures 6cm length x 2.2cm width x 0.2cm depth; 10.367cm^2 area and 2.073cm^3 volume. The wound is limited to skin breakdown. There is no tunneling or undermining noted. There is a large amount of serous drainage noted. The wound margin is thickened. There is no granulation within the wound bed. There is a large (67-100%) amount of necrotic tissue within the wound bed including Adherent Slough. The periwound skin appearance exhibited: Localized Edema, Moist. Periwound temperature was noted as No Abnormality. The periwound has tenderness on palpation. Wound #2 status is Open. Original cause of wound was Gradually Appeared. The wound is located on the Right,Lateral Malleolus. The wound measures 12cm length x 7cm width x 0.2cm depth; 65.973cm^2 area and 13.195cm^3 volume. The wound is limited to skin breakdown. There is no tunneling or undermining noted. There is a large amount of serous drainage noted. The wound margin is thickened. There is  no granulation within the wound bed. There is a large (67-100%) amount of necrotic tissue within the wound bed including Adherent Slough. The periwound skin  appearance exhibited: Localized Edema, Moist. Periwound temperature was noted as No Abnormality. The periwound has tenderness on palpation. Assessment Active Problems ICD-10 L97.312 - Non-pressure chronic ulcer of right ankle with fat layer exposed D57.1 - Sickle-cell disease 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 this 45 year old patient who is known to have sickle cell disease has had right ankle ulcers for a while over the last several years. Her most recent treatment with Theraskin was very beneficial but these ulcers opened out soon after. I have recommended: 1. alternate day dosing with Aquacel Ag and a light Kerlix and Coban wrap to be applied. SEELEY, KARMANN ([id number]) 2. Doxycycline 100 mg twice a day for 14 days to be given empirically as she seemed to have a lot of subclinical infection 3. Good control of her sickle cell anemia with treatment as per her PCP 4. May need debridement in the operating room as this wound is very tender to do anything in the outpatient center 5. Once healthy granulation tissues achieved we may be able to get her skin substitute Plan Wound Cleansing: Wound #1 Right,Medial Malleolus: Clean wound with Normal Saline. Cleanse wound with mild soap and water Wound #2 Right,Lateral Malleolus: Clean wound with Normal Saline. Cleanse wound with mild soap and water Anesthetic: Wound #1 Right,Medial Malleolus: Topical Lidocaine 4% cream applied to wound bed prior to debridement Wound #2 Right,Lateral Malleolus: Topical Lidocaine 4% cream applied to wound bed prior to debridement Skin Barriers/Peri-Wound Care: Wound #1 Right,Medial Malleolus: Barrier cream Wound #2 Right,Lateral Malleolus: Barrier cream Primary Wound Dressing: Wound #1 Right,Medial Malleolus: Aquacel Ag Wound #2 Right,Lateral Malleolus: Aquacel Ag Secondary Dressing: Wound #1 Right,Medial Malleolus: ABD pad Conform/Kerlix Wound #2 Right,Lateral Malleolus: ABD pad Conform/Kerlix Dressing Change  Frequency: Wound #1 Right,Medial Malleolus: Change dressing every other day. Wound #2 Right,Lateral Malleolus: Change dressing every other day. Follow-up Appointments: Wound #1 Right,Medial Malleolus: Return Appointment in 1 week. Wound #2 Right,Lateral Malleolus: Return Appointment in 1 week. Edema Control: Wound #1 Right,Medial Malleolus: SIMI, BERMUDES ([id number]) 2 Layer Lite Compression System - Right Lower Extremity Wound #2 Right,Lateral Malleolus: 2 Layer Lite Compression System - Right Lower Extremity The following medication(s) was prescribed: doxycycline hyclate oral 100 mg capsule 1 1 capsule oral bid starting 09/08/2015 this 45 year old patient who is known to have sickle cell disease has had right ankle ulcers for a while over the last several years. Her most recent treatment with Theraskin was very beneficial but these ulcers opened out soon after. I have recommended: 1. alternate day dosing with Aquacel Ag and a light Kerlix and Coban wrap to be applied. 2. Doxycycline 100 mg twice a day for 14 days to be given empirically as she seemed to have a lot of subclinical infection 3. Good control of her sickle cell anemia with treatment as per her PCP 4. May need debridement in the operating room as this wound is very tender to do anything in the outpatient center 5. Once healthy granulation tissues achieved we may be able to get her skin substitute Electronic Signature(s) Signed: 09/15/2015 12:36:43 PM By: Christin Fudge MD, FACS Previous Signature: 09/08/2015 4:58:07 PM Version By: Christin Fudge MD, FACS Previous Signature: 09/08/2015 4:57:58 PM Version By: Christin Fudge MD, FACS Previous Signature: 09/08/2015 4:57:43 PM Version By: Christin Fudge MD, FACS Previous Signature: 09/08/2015 11:07:25 AM Version By: Christin Fudge MD, FACS Entered By: Christin Fudge on  09/15/2015 12:36:43 AVAGAIL, LUSARDI  ([id number]) -------------------------------------------------------------------------------- ROS/PFSH Details Patient Name: [name] Date of Service: 09/08/2015 9:30 AM Medical Record Number: [id number] Patient Account Number: [id number] Date of Birth/Sex: 11/21/1970 (45 y.o. Female) Treating RN: Carolyne Fiscal, Debi Primary Care Physician: Liston Alba Other Clinician: Referring Physician: Liston Alba Treating Physician/Extender: Frann Rider in Treatment: 0 Information Obtained From Patient Other: Wound History Do you currently have one or more open woundso Yes How many open wounds do you currently haveo 2 Approximately how long have you had your woundso more than a year How have you been treating your wound(s) until nowo abt cream Has your wound(s) ever healed and then re-openedo Yes Have you had any lab work done in the past montho No Have you tested positive for an antibiotic resistant organism (MRSA, VRE)o No Have you tested positive for osteomyelitis (bone infection)o No Have you had any tests for circulation on your legso Yes Who ordered the testo Dr. Dellia Nims Where was the test doneo [place] Have you had other problems associated with your woundso Swelling Constitutional Symptoms (General Health) Complaints and Symptoms: Positive for: Chills Integumentary (Skin) Complaints and Symptoms: Positive for: Wounds Eyes Complaints and Symptoms: No Complaints or Symptoms Ear/Nose/Mouth/Throat Complaints and Symptoms: No Complaints or Symptoms Hematologic/Lymphatic Medical History: Positive for: Anemia; Sickle Cell Disease Respiratory CAYCI, WENGLER ([id number]) Complaints and Symptoms: No Complaints or Symptoms Cardiovascular Complaints and Symptoms: No Complaints or Symptoms Gastrointestinal Complaints and Symptoms: No Complaints or Symptoms Endocrine Complaints and Symptoms: No Complaints or Symptoms Genitourinary Complaints and  Symptoms: No Complaints or Symptoms Immunological Complaints and Symptoms: No Complaints or Symptoms Musculoskeletal Complaints and Symptoms: No Complaints or Symptoms Neurologic Complaints and Symptoms: No Complaints or Symptoms Oncologic Complaints and Symptoms: No Complaints or Symptoms Psychiatric Complaints and Symptoms: No Complaints or Symptoms Family and Social History Cancer: No; Diabetes: Yes - Mother; Heart Disease: No; Hereditary Spherocytosis: No; Hypertension: No; Kidney Disease: No; Lung Disease: No; Seizures: No; Stroke: No; Thyroid Problems: No; Tuberculosis: No; Never smoker; Marital Status - Married; Alcohol Use: Never; Drug Use: No History; Caffeine Use: Daily; JAMESYN, PLAYER ([id number]) Financial Concerns: No; Food, Clothing or Shelter Needs: No; Support System Lacking: No; Transportation Concerns: No; Advanced Directives: No; Patient does not want information on Advanced Directives; Do not resuscitate: No; Living Will: No; Medical Power of Attorney: No Physician Affirmation I have reviewed and agree with the above information. Electronic Signature(s) Signed: 09/08/2015 9:52:23 AM By: Christin Fudge MD, FACS Signed: 09/08/2015 5:23:57 PM By: Alric Quan Entered By: Christin Fudge on 09/08/2015 09:52:23 [name] ([id number]) -------------------------------------------------------------------------------- SuperBill Details Patient Name: [name] Date of Service: 09/08/2015 Medical Record Number: [id number] Patient Account Number: [id number] Date of Birth/Sex: 1970-11-15 (45 y.o. Female) Treating RN: Carolyne Fiscal, Debi Primary Care Physician: Liston Alba Other Clinician: Referring Physician: Liston Alba Treating Physician/Extender: Frann Rider in Treatment: 0 Diagnosis Coding ICD-10 Codes Code Description [id number] Non-pressure chronic ulcer of right ankle with fat layer exposed D57.1 Sickle-cell disease without  crisis Facility Procedures CPT4 Code: [id number] Description: [id number] - WOUND CARE VISIT-LEV 5 EST PT Modifier: Quantity: 1 Physician Procedures CPT4 Code Description: [id number] - WC PHYS LEVEL 4 - EST PT ICD-10 Description Diagnosis [id number] Non-pressure chronic ulcer of right ankle with fat 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 Modifier: layer expose Quantity: 1 d Electronic Signature(s) Signed: 09/08/2015 4:56:07 PM By: Christin Fudge MD, FACS Signed: 09/08/2015 5:23:57 PM By: Alric Quan Previous Signature: 09/08/2015 11:07:41 AM Version By: Christin Fudge MD, FACS Entered By: Alric Quan on 09/08/2015  16:13:49 

## 2015-09-09 NOTE — Progress Notes (Signed)
KINLYNN, WIATROWSKI ([id number]) Visit Report for 09/08/2015 Abuse/Suicide Risk Screen Details Patient Name: [name], [name] Date of Service: 09/08/2015 9:30 AM Medical Record Number: [id number] Patient Account Number: [id number] Date of Birth/Sex: Aug 25, 1970 (45 y.o. Female) Treating RN: Carolyne Fiscal, Debi Primary Care Physician: Liston Alba Other Clinician: Referring Physician: Liston Alba Treating Physician/Extender: Frann Rider in Treatment: 0 Abuse/Suicide Risk Screen Items Answer ABUSE/SUICIDE RISK SCREEN: Has anyone close to you tried to hurt or harm you recentlyo No Do you feel uncomfortable with anyone in your familyo No Has anyone forced you do things that you didnot want to doo No Do you have any thoughts of harming yourselfo No Patient displays signs or symptoms of abuse and/or neglect. No Electronic Signature(s) Signed: 09/08/2015 5:23:57 PM By: Alric Quan Entered By: Alric Quan on 09/08/2015 09:47:44 [name] ([id number]) -------------------------------------------------------------------------------- Activities of Daily Living Details Patient Name: [name] Date of Service: 09/08/2015 9:30 AM Medical Record Number: [id number] Patient Account Number: [id number] Date of Birth/Sex: 07-Jul-1970 (45 y.o. Female) Treating RN: Carolyne Fiscal, Debi Primary Care Physician: Liston Alba Other Clinician: Referring Physician: Liston Alba Treating Physician/Extender: Frann Rider in Treatment: 0 Activities of Daily Living Items Answer Activities of Daily Living (Please select one for each item) Drive Automobile Completely Able Take Medications Completely Able Use Telephone Completely Able Care for Appearance Completely Able Use Toilet Completely Able Bath / Shower Completely Able Dress Self Completely Able Feed Self Completely Able Walk Completely Able Get In / Out Bed Completely Able Housework Completely Able Prepare  Meals Completely Able Handle Money Completely Able Shop for Self Completely Able Electronic Signature(s) Signed: 09/08/2015 5:23:57 PM By: Alric Quan Entered By: Alric Quan on 09/08/2015 09:48:13 [name] ([id number]) -------------------------------------------------------------------------------- Education Assessment Details Patient Name: [name] Date of Service: 09/08/2015 9:30 AM Medical Record Number: [id number] Patient Account Number: [id number] Date of Birth/Sex: 1971/03/30 (45 y.o. Female) Treating RN: Carolyne Fiscal, Debi Primary Care Physician: Liston Alba Other Clinician: Referring Physician: Liston Alba Treating Physician/Extender: Frann Rider in Treatment: 0 Primary Learner Assessed: Patient Learning Preferences/Education Level/Primary Language Learning Preference: Explanation, Printed Material Highest Education Level: High School Preferred Language: English Cognitive Barrier Assessment/Beliefs Language Barrier: No Translator Needed: No Memory Deficit: No Emotional Barrier: No Cultural/Religious Beliefs Affecting Medical No Care: Physical Barrier Assessment Impaired Vision: No Impaired Hearing: No Decreased Hand dexterity: No Knowledge/Comprehension Assessment Knowledge Level: High Comprehension Level: High Ability to understand written High instructions: Ability to understand verbal High instructions: Motivation Assessment Anxiety Level: Calm Cooperation: Cooperative Education Importance: Acknowledges Need Interest in Health Problems: Asks Questions Perception: Coherent Willingness to Engage in Self- High Management Activities: Readiness to Engage in Self- High Management Activities: Electronic Signature(s) ISSABELLA, SERES ([id number]) Signed: 09/08/2015 5:23:57 PM By: Alric Quan Entered By: Alric Quan on 09/08/2015 09:49:06 [name]  ([id number]) -------------------------------------------------------------------------------- Fall Risk Assessment Details Patient Name: [name] Date of Service: 09/08/2015 9:30 AM Medical Record Number: [id number] Patient Account Number: [id number] Date of Birth/Sex: March 07, 1971 (45 y.o. Female) Treating RN: Carolyne Fiscal, Debi Primary Care Physician: Liston Alba Other Clinician: Referring Physician: Liston Alba Treating Physician/Extender: Frann Rider in Treatment: 0 Fall Risk Assessment Items Have you had 2 or more falls in the last 12 monthso 0 No Have you had any fall that resulted in injury in the last 12 monthso 0 No FALL RISK ASSESSMENT: History of falling - immediate or within 3 months 0 No Secondary diagnosis 0 No Ambulatory aid None/bed rest/wheelchair/nurse 0 No Crutches/cane/walker 0 No Furniture 0 No IV Access/Saline Lock 0 No Gait/Training Normal/bed  rest/immobile 0 No Weak 0 No Impaired 0 No Mental Status Oriented to own ability 0 Yes Electronic Signature(s) Signed: 09/08/2015 5:23:57 PM By: Alric Quan Entered By: Alric Quan on 09/08/2015 09:49:18 [name] ([id number]) -------------------------------------------------------------------------------- Foot Assessment Details Patient Name: [name] Date of Service: 09/08/2015 9:30 AM Medical Record Number: [id number] Patient Account Number: [id number] Date of Birth/Sex: [birth year] (45 y.o. Female) Treating RN: Carolyne Fiscal, Debi Primary Care Physician: Liston Alba Other Clinician: Referring Physician: Liston Alba Treating Physician/Extender: Frann Rider in Treatment: 0 Foot Assessment Items Site Locations + = Sensation present, - = Sensation absent, C = Callus, U = Ulcer R = Redness, W = Warmth, M = Maceration, PU = Pre-ulcerative lesion F = Fissure, S = Swelling, D = Dryness Assessment Right: Left: Other Deformity: No No Prior Foot Ulcer:  No No Prior Amputation: No No Charcot Joint: No No Ambulatory Status: Ambulatory Without Help Gait: Steady Electronic Signature(s) Signed: 09/08/2015 5:23:57 PM By: Alric Quan Entered By: Alric Quan on 09/08/2015 09:54:01 [name] ([id number]) -------------------------------------------------------------------------------- Nutrition Risk Assessment Details Patient Name: [name] Date of Service: 09/08/2015 9:30 AM Medical Record Number: [id number] Patient Account Number: [id number] Date of Birth/Sex: [birth year] (45 y.o. Female) Treating RN: Carolyne Fiscal, Debi Primary Care Physician: Liston Alba Other Clinician: Referring Physician: Liston Alba Treating Physician/Extender: Frann Rider in Treatment: 0 Height (in): 67 Weight (lbs): 108 Body Mass Index (BMI): 16.9 Nutrition Risk Assessment Items NUTRITION RISK SCREEN: I have an illness or condition that made me change the kind and/or 2 Yes amount of food I eat I eat fewer than two meals per day 3 Yes I eat few fruits and vegetables, or milk products 2 Yes I have three or more drinks of beer, liquor or wine almost every day 0 No I have tooth or mouth problems that make it hard for me to eat 0 No I don't always have enough money to buy the food I need 0 No I eat alone most of the time 0 No I take three or more different prescribed or over-the-counter drugs a 0 No day Without wanting to, I have lost or gained 10 pounds in the last six 2 Yes months I am not always physically able to shop, cook and/or feed myself 0 No Nutrition Protocols Good Risk Protocol Moderate Risk Protocol Electronic Signature(s) Signed: 09/08/2015 5:23:57 PM By: Alric Quan Entered By: Alric Quan on 09/08/2015 09:50:01

## 2015-09-15 ENCOUNTER — Ambulatory Visit: Payer: Medicaid Other | Admitting: Surgery

## 2015-09-27 ENCOUNTER — Encounter (HOSPITAL_BASED_OUTPATIENT_CLINIC_OR_DEPARTMENT_OTHER): Payer: Medicaid Other | Attending: Surgery

## 2015-09-27 ENCOUNTER — Ambulatory Visit (HOSPITAL_COMMUNITY)
Admission: RE | Admit: 2015-09-27 | Discharge: 2015-09-27 | Disposition: A | Discharge status: Home / Self Care | Payer: Medicaid Other | Source: Ambulatory Visit | Attending: Surgery | Admitting: Surgery

## 2015-09-27 ENCOUNTER — Other Ambulatory Visit: Payer: Self-pay | Admitting: Surgery

## 2015-09-27 DIAGNOSIS — D571 Sickle-cell disease 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: Secondary | ICD-10-CM | POA: Diagnosis not present

## 2015-09-27 DIAGNOSIS — L97312 Non-pressure chronic ulcer of right ankle with fat layer exposed: Secondary | ICD-10-CM | POA: Diagnosis not present

## 2015-09-27 DIAGNOSIS — L97311 Non-pressure chronic ulcer of right ankle limited to breakdown of skin: Secondary | ICD-10-CM | POA: Diagnosis present

## 2015-09-27 DIAGNOSIS — M86171 Other acute osteomyelitis, right ankle and foot: Secondary | ICD-10-CM

## 2015-09-27 MED FILL — DOXYCYCLINE HYCLATE 100 MG: 100 | 14 days supply | Qty: 28 | Fill #0

## 2015-09-29 ENCOUNTER — Ambulatory Visit (INDEPENDENT_AMBULATORY_CARE_PROVIDER_SITE_OTHER): Payer: Medicaid Other | Admitting: Family Medicine

## 2015-09-29 ENCOUNTER — Encounter: Payer: Self-pay | Admitting: Family Medicine

## 2015-09-29 ENCOUNTER — Other Ambulatory Visit: Payer: Self-pay | Admitting: Internal Medicine

## 2015-09-29 VITALS — BP 110/63 | HR 73 | Temp 98.1°F | Resp 18 | Ht 67.0 in | Wt 120.0 lb

## 2015-09-29 DIAGNOSIS — D571 Sickle-cell disease 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: Secondary | ICD-10-CM

## 2015-09-29 DIAGNOSIS — L97319 Non-pressure chronic ulcer of right ankle with unspecified severity: Secondary | ICD-10-CM | POA: Diagnosis not present

## 2015-09-29 LAB — COMPLETE METABOLIC PANEL WITH GFR
ALT: 27 U/L (ref 6–29)
AST: 40 U/L — ABNORMAL HIGH (ref 10–30)
Albumin: 4.5 g/dL (ref 3.6–5.1)
Alkaline Phosphatase: 51 U/L (ref 33–115)
BILIRUBIN TOTAL: 3.5 mg/dL — AB (ref 0.2–1.2)
BUN: 8 mg/dL (ref 7–25)
CHLORIDE: 103 mmol/L (ref 98–110)
CO2: 24 mmol/L (ref 20–31)
CREATININE: 0.31 mg/dL — AB (ref 0.50–1.10)
Calcium: 9.2 mg/dL (ref 8.6–10.2)
GFR, Est African American: >89 mL/min (ref >=60)
GFR, Est Non African American: >89 mL/min (ref >=60)
Glucose, Bld: 86 mg/dL (ref 65–99)
Potassium: 4 mmol/L (ref 3.5–5.3)
SODIUM: 138 mmol/L (ref 135–146)
TOTAL PROTEIN: 7.6 g/dL (ref 6.1–8.1)

## 2015-09-29 MED ORDER — OXYCODONE-ACETAMINOPHEN 10-325 MG PO TABS: 1.0000 | ORAL_TABLET | ORAL | Status: DC | PRN

## 2015-09-29 MED FILL — OXYCODONE-APAP 10-325 TAB: 10-325 | 15 days supply | Qty: 90 | Fill #0

## 2015-09-29 NOTE — Progress Notes (Signed)
Subjective:    Patient ID: [name], female    DOB: [birth year], 45 y.o.   MRN: [id number]  HPI [name], a 45 year old female with a history of sickle cell anemia, HbSS presents for a 1 month follow up. Patient reports that she has severe pain to bilateral lower extremities. She maintains that pain intensity is 5/10 She last had Percocet 10-325 mg several hours ago with minimal relief. Patient has right lower extremity non pressure ulcer. She received a skin grafting several months ago and is scheduled to have repeat skin grafting on 10/04/2015 . She states that ulcers have re-opened and are starting to drain. She was started on oral antibiotic by wound care specialist 1 week ago.  . She states that she has been applying dressings as order by wound care. Interventions to date has included debridement and antibiotics.   She denies headache, chest pain, shortness of breath, nausea, vomiting diarrhea, or constipation.   Past Medical History  Diagnosis Date  . Sickle cell disease ([place])   . Anemia 03/30/2012    Hx of sickle cell disease  . Leg ulcer ([place]) 10/27/2012    Chronic under care of wound clinic  . Cholelithiasis y-1  . CAP (community acquired pneumonia)     2014  . Anemia complicating pregnancy [id number]   Immunization History  Administered Date(s) Administered  . Influenza,inj,Quad PF,36+ Mos 03/16/2013, 01/20/2014, 03/01/2015  . Pneumococcal Polysaccharide-23 03/24/2014  . Tdap 11/25/2013   Social History   Social History  . Marital Status: Married    Spouse Name: [name]  . Number of Children: 1  . Years of Education: N/A   Occupational History  . Employed in home care.    Social History Main Topics  . Smoking status: Never Smoker   . Smokeless tobacco: Never Used  . Alcohol Use: No  . Drug Use: No  . Sexual Activity: Not Currently    Birth Control/ Protection: None   Other Topics Concern  . Not on file   Social History Narrative   Lives with husband.  No Known Allergies  Review of Systems  Constitutional: Positive for fatigue. Negative for fever.  HENT: Negative.   Eyes: Negative for visual disturbance.  Respiratory: Negative.   Cardiovascular: Negative.   Gastrointestinal: Negative.   Endocrine: Negative.  Negative for polydipsia, polyphagia and polyuria.  Musculoskeletal: Positive for myalgias.       Pain to right lower extremity  Skin: Negative.        Right lower extremity ulcers  Neurological: Negative.   Hematological: Negative.   Psychiatric/Behavioral: Negative.        Objective:   Physical Exam  Constitutional: She is oriented to person, place, and time. She appears well-developed and well-nourished.  HENT:  Head: Normocephalic and atraumatic.  Right Ear: External ear normal.  Left Ear: External ear normal.  Nose: Nose normal.  Mouth/Throat: Oropharynx is clear and moist.  Eyes: Conjunctivae and EOM are normal. Pupils are equal, round, and reactive to light.  Neck: Normal range of motion. Neck supple.  Cardiovascular: Normal rate, normal heart sounds and intact distal pulses.   Pulmonary/Chest: Effort normal and breath sounds normal.  Abdominal: Soft. Bowel sounds are normal.  Musculoskeletal: Normal range of motion.  Neurological: She is alert and oriented to person, place, and time. She has normal reflexes.  Skin: Skin is warm and dry.  Wounds to right lower extremity with full thickness skin loss, however fascia is not exposed.  Psychiatric: She has a normal mood and affect. Her behavior is normal. Judgment and thought content normal.      BP 110/63 mmHg  Pulse 73  Temp(Src) 98.1 F (36.7 C) (Oral)  Resp 18  Ht 5\' 7"  (1.702 m)  Wt 120 lb (54.432 kg)  BMI 18.79 kg/m2  SpO2 93%  LMP 09/26/2015     Assessment & Plan:  1. Hb-SS disease 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 ([place]) Acute and chronic painful episodes -  Will continue Percocet 10-325 mg every 4 hours as needed for moderate to severe chronic  pain. [name] has been having increased pain since stasis ulcers have resurfaced. She is currently having dressings changes at the wound center weekly. We will discuss titration of current dosage during next office visit.  We discussed that pt is to receive her Schedule II prescriptions only from [place]. Pt is also aware that the prescription history is available to [place] online through the [facility] CSRS. Controlled substance agreement signed previously. We reminded [name] that all patients receiving Schedule II narcotics must be seen for follow within one month of prescription being requested. We reviewed the terms of our pain agreement, including the need to keep medicines in a safe locked location away from children or pets, and the need to report excess sedation or constipation, measures to avoid constipation, and policies related to early refills and stolen prescriptions. According to the [place] [facility] program, we have reviewed details related to analgesia, adverse effects, aberrant behaviors. Reviewed  Substance Reporting system prior to prescribing opiate medications.  - oxyCODONE-acetaminophen (PERCOCET) 10-325 MG tablet; Take 1 tablet by mouth every 4 (four) hours as needed for pain. For severe pain greater than 7/10  Dispense: 90 tablet; Refill: 0 - COMPLETE METABOLIC PANEL WITH GFR - CBC with Differential  2. Non-pressure chronic ulcer of ankle, right, with unspecified severity ([place]) [name] has been having increased pain since stasis ulcers have resurfaced. Patient to follow up with wound care specialists and plastic surgeon as scheduled.   RTC: 1 month for sickle cell anemia and chronic pain management. Will follow up with patient by phone with laboratory results  The patient was given clear instructions to go to ER or return to medical center if symptoms do not improve, worsen or new problems develop. The patient verbalized understanding. Will notify patient with laboratory  results.   [name], FNP

## 2015-09-29 NOTE — Patient Instructions (Signed)
Sickle Cell Anemia, Adult Sickle cell anemia is a condition in which red blood cells have an abnormal "sickle" shape. This abnormal shape shortens the cells' life span, which results in a lower than normal concentration of red blood cells in the blood. The sickle shape also causes the cells to clump together and block free blood flow through the blood vessels. As a result, the tissues and organs of the body do not receive enough oxygen. Sickle cell anemia causes organ damage and pain and increases the risk of infection. CAUSES  Sickle cell anemia is a genetic disorder. Those who receive two copies of the gene have the condition, and those who receive one copy have the trait. RISK FACTORS The sickle cell gene is most common in people whose families originated in [place]. Other areas of the globe where sickle cell trait occurs include the Mediterranean, South and [place], the [place], and the [place].  SIGNS AND SYMPTOMS  Pain, especially in the extremities, back, chest, or abdomen (common). The pain may start suddenly or may develop following an illness, especially if there is dehydration. Pain can also occur due to overexertion or exposure to extreme temperature changes.  Frequent severe bacterial infections, especially certain types of pneumonia and meningitis.  Pain and swelling in the hands and feet.  Decreased activity.   Loss of appetite.   Change in behavior.  Headaches.  Seizures.  Shortness of breath or difficulty breathing.  Vision changes.  Skin ulcers. Those with the trait may not have symptoms or they may have mild symptoms.  DIAGNOSIS  Sickle cell anemia is diagnosed with blood tests that demonstrate the genetic trait. It is often diagnosed during the newborn period, due to mandatory testing nationwide. A variety of blood tests, X-rays, CT scans, MRI scans, ultrasounds, and lung function tests may also be done to monitor the condition. TREATMENT  Sickle  cell anemia may be treated with:  Medicines. You may be given pain medicines, antibiotic medicines (to treat and prevent infections) or medicines to increase the production of certain types of hemoglobin.  Fluids.  Oxygen.  Blood transfusions. HOME CARE INSTRUCTIONS   Drink enough fluid to keep your urine clear or pale yellow. Increase your fluid intake in hot weather and during exercise.  Do not smoke. Smoking lowers oxygen levels in the blood.   Only take over-the-counter or prescription medicines for pain, fever, or discomfort as directed by your health care provider.  Take antibiotics as directed by your health care provider. Make sure you finish them it even if you start to feel better.   Take supplements as directed by your health care provider.   Consider wearing a medical alert bracelet. This tells anyone caring for you in an emergency of your condition.   When traveling, keep your medical information, health care provider's names, and the medicines you take with you at all times.   If you develop a fever, do not take medicines to reduce the fever right away. This could cover up a problem that is developing. Notify your health care provider.  Keep all follow-up appointments with your health care provider. Sickle cell anemia requires regular medical care. SEEK MEDICAL CARE IF: You have a fever. SEEK IMMEDIATE MEDICAL CARE IF:   You feel dizzy or faint.   You have new abdominal pain, especially on the left side near the stomach area.   You develop a persistent, often uncomfortable and painful penile erection (priapism). If this is not treated immediately it   will lead to impotence.   You have numbness your arms or legs or you have a hard time moving them.   You have a hard time with speech.   You have a fever or persistent symptoms for more than 2-3 days.   You have a fever and your symptoms suddenly get worse.   You have signs or symptoms of infection.  These include:   Chills.   Abnormal tiredness (lethargy).   Irritability.   Poor eating.   Vomiting.   You develop pain that is not helped with medicine.   You develop shortness of breath.  You have pain in your chest.   You are coughing up pus-like or bloody sputum.   You develop a stiff neck.  Your feet or hands swell or have pain.  Your abdomen appears bloated.  You develop joint pain. MAKE SURE YOU:  Understand these instructions.   This information is not intended to replace advice given to you by your health care provider. Make sure you discuss any questions you have with your health care provider.   Document Released: 08/14/2005 Document Revised: 05/27/2014 Document Reviewed: 12/16/2012 Elsevier Interactive Patient Education 2016 Elsevier Inc.  

## 2015-09-30 LAB — CBC WITH DIFFERENTIAL/PLATELET
Basophils Absolute: 258 cells/uL — ABNORMAL HIGH (ref 0–200)
Basophils Relative: 2 %
Eosinophils Absolute: 1032 cells/uL — ABNORMAL HIGH (ref 15–500)
Eosinophils Relative: 8 %
HEMATOCRIT: 20.3 % — AB (ref 35.0–45.0)
Hemoglobin: 7.1 g/dL — ABNORMAL LOW (ref 11.7–15.5)
LYMPHS PCT: 32 %
Lymphs Abs: 4128 cells/uL — ABNORMAL HIGH (ref 850–3900)
MCH: 29.6 pg (ref 27.0–33.0)
MCHC: 35 g/dL (ref 32.0–36.0)
MCV: 84.6 fL (ref 80.0–100.0)
MONO ABS: 1677 {cells}/uL — AB (ref 200–950)
MONOS PCT: 13 %
MPV: 9.3 fL (ref 7.5–12.5)
NEUTROS PCT: 45 %
Neutro Abs: 5805 cells/uL (ref 1500–7800)
PLATELETS: 616 10*3/uL — AB (ref 140–400)
RBC: 2.4 MIL/uL — AB (ref 3.80–5.10)
RDW: 23.3 % — AB (ref 11.0–15.0)
WBC: 12.9 10*3/uL — AB (ref 3.8–10.8)

## 2015-10-02 ENCOUNTER — Telehealth: Payer: Self-pay

## 2015-10-02 NOTE — Telephone Encounter (Signed)
Called and informed patient that there is no need for a transfusion before procedure on Wednesday. Patient verbalized understanding. Thanks!

## 2015-10-02 NOTE — Telephone Encounter (Signed)
-----   Message from [name], [place] sent at 10/02/2015  7:47 AM EDT ----- Regarding: lab results Please inform Ms. Krummel that hemoglobin is above baseline. I will not transfuse prior to procedure scheduled on Wednesday.  Thanks ----- Message -----    From: Lab in [place] Interface    Sent: 09/30/2015  12:48 AM      To: [name], FNP

## 2015-10-03 ENCOUNTER — Ambulatory Visit (HOSPITAL_COMMUNITY)
Admission: RE | Admit: 2015-10-03 | Discharge: 2015-10-03 | Disposition: A | Discharge status: Home / Self Care | Payer: Medicaid Other | Source: Ambulatory Visit | Attending: Vascular Surgery | Admitting: Vascular Surgery

## 2015-10-03 ENCOUNTER — Other Ambulatory Visit: Payer: Self-pay | Admitting: Surgery

## 2015-10-03 DIAGNOSIS — D571 Sickle-cell disease 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: Secondary | ICD-10-CM | POA: Diagnosis not present

## 2015-10-03 DIAGNOSIS — L97919 Non-pressure chronic ulcer of unspecified part of right lower leg with unspecified severity: Secondary | ICD-10-CM | POA: Insufficient documentation

## 2015-10-04 ENCOUNTER — Encounter (HOSPITAL_BASED_OUTPATIENT_CLINIC_OR_DEPARTMENT_OTHER): Payer: Self-pay | Admitting: *Deleted

## 2015-10-05 NOTE — H&P (Signed)
  Subjective:    Patient ID: [name] is a 45 y.o. female.  HPI Referred by Dr. Con Memos for evaluation. Known to me from [place] and prior debridement, application Theraskin to RLE wounds. Etiology not established, patient with sickle cell and chronic anemia, thrombocytosis, last transfusion 3 months ago. Reports that recurrent wounds developed soon after complete healing. Current wound present for 3 months, states Aquacell applied in wound center. Patient herself just placing antibiotic ointment and covering. Has significant tenderness wound and unable to tolerate clinic debridement.  Recent culture wound negative.  Last Hb 7.1 5 days ago, plt 616 Plan films negative 09/2015  Review of Systems     Objective:   Physical Exam  Constitutional: She is oriented to person, place, and time.  Cardiovascular: Normal rate, regular rhythm and normal heart sounds.  Pulmonary/Chest: Effort normal and breath sounds normal.  Neurological: She is alert and oriented to person, place, and time.   Right ankle: no edema, cluster open wounds medial and lateral ankle, laterally these are different areas than previously treated, more distal and onto foot, 2.5 x 5 x 0.1cm  Medial ankle open area cluster 9 x 8 cm x 0.1 cm Slough present no cellulitis    Assessment:     Recurrent non pressure ulcer ankle Sickle cell    Plan:     Recurrent chronic ulceration that has failed local wound care, unable to tolerate bedside debridement. Plan debridement in OR, application Theraskin which she responded to well, though ultimately recurred. Counseled patient I have some concern given appearance wound, new areas of ulceration and no evidence edema or varicosities of pyoderma gangrenosum and will take biopsies to evaluate for this.  Irene Limbo, MD [facility] Plastic & Reconstructive Surgery [phone]

## 2015-10-06 NOTE — Anesthesia Preprocedure Evaluation (Addendum)
Anesthesia Evaluation  Patient identified by MRN, date of birth, ID band Patient awake    Reviewed: Allergy & Precautions, H&P , NPO status , Patient's Chart, lab work & pertinent test results  Airway Mallampati: II  TM Distance: >3 FB Neck ROM: Full    Dental no notable dental hx. (+) Teeth Intact, Dental Advisory Given   Pulmonary neg pulmonary ROS,    Pulmonary exam normal breath sounds clear to auscultation       Cardiovascular negative cardio ROS   Rhythm:Regular Rate:Normal     Neuro/Psych negative neurological ROS  negative psych ROS   GI/Hepatic negative GI ROS, Neg liver ROS,   Endo/Other  negative endocrine ROS  Renal/GU negative Renal ROS  negative genitourinary   Musculoskeletal   Abdominal   Peds  Hematology negative hematology ROS (+) Sickle cell anemia ,   Anesthesia Other Findings Called Dr. Leland Johns re Hb level of 7.1.  She expects no blood loss and can do this under MAC.  Sickle Cell center feels this is her chronic level.  Reproductive/Obstetrics negative OB ROS                            Anesthesia Physical Anesthesia Plan  ASA: II  Anesthesia Plan: MAC   Post-op Pain Management:    Induction: Intravenous  Airway Management Planned: Simple Face Mask  Additional Equipment:   Intra-op Plan:   Post-operative Plan:   Informed Consent: I have reviewed the patients History and Physical, chart, labs and discussed the procedure including the risks, benefits and alternatives for the proposed anesthesia with the patient or authorized representative who has indicated his/her understanding and acceptance.   Dental advisory given  Plan Discussed with: CRNA  Anesthesia Plan Comments:         Anesthesia Quick Evaluation

## 2015-10-09 ENCOUNTER — Encounter (HOSPITAL_BASED_OUTPATIENT_CLINIC_OR_DEPARTMENT_OTHER): Payer: Self-pay | Admitting: *Deleted

## 2015-10-09 ENCOUNTER — Encounter (HOSPITAL_BASED_OUTPATIENT_CLINIC_OR_DEPARTMENT_OTHER)
Admission: RE | Disposition: A | Discharge status: Home / Self Care | Payer: Self-pay | Source: Ambulatory Visit | Attending: Plastic Surgery

## 2015-10-09 ENCOUNTER — Ambulatory Visit (HOSPITAL_BASED_OUTPATIENT_CLINIC_OR_DEPARTMENT_OTHER)
Admission: RE | Admit: 2015-10-09 | Discharge: 2015-10-09 | Disposition: A | Discharge status: Home / Self Care | Payer: Medicaid Other | Source: Ambulatory Visit | Attending: Plastic Surgery | Admitting: Plastic Surgery

## 2015-10-09 ENCOUNTER — Ambulatory Visit (HOSPITAL_BASED_OUTPATIENT_CLINIC_OR_DEPARTMENT_OTHER): Payer: Medicaid Other | Admitting: Anesthesiology

## 2015-10-09 DIAGNOSIS — D571 Sickle-cell disease 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: Secondary | ICD-10-CM

## 2015-10-09 DIAGNOSIS — Z792 Long term (current) use of antibiotics: Secondary | ICD-10-CM | POA: Diagnosis not present

## 2015-10-09 DIAGNOSIS — L97311 Non-pressure chronic ulcer of right ankle limited to breakdown of skin: Secondary | ICD-10-CM | POA: Insufficient documentation

## 2015-10-09 HISTORY — PX: APPLICATION OF A-CELL OF EXTREMITY: SHX6303

## 2015-10-09 HISTORY — PX: I & D EXTREMITY: SHX5045

## 2015-10-09 LAB — POCT HEMOGLOBIN-HEMACUE: Hemoglobin: 6.8 g/dL — CL (ref 12.0–15.0)

## 2015-10-09 SURGERY — APPLICATION OF A-CELL OF EXTREMITY
Anesthesia: Monitor Anesthesia Care | Site: Ankle | Laterality: Right

## 2015-10-09 MED ORDER — OXYCODONE-ACETAMINOPHEN 5-325 MG PO TABS
ORAL_TABLET | ORAL | Status: AC
  Filled 2015-10-09: qty 1

## 2015-10-09 MED ORDER — HYDROMORPHONE HCL 1 MG/ML IJ SOLN
INTRAMUSCULAR | Status: AC
  Filled 2015-10-09: qty 1

## 2015-10-09 MED ORDER — GLYCOPYRROLATE 0.2 MG/ML IJ SOLN: 0.2000 mg | Freq: Once | INTRAMUSCULAR | Status: DC | PRN

## 2015-10-09 MED ORDER — MIDAZOLAM HCL 2 MG/2ML IJ SOLN: 1.0000 mg | INTRAMUSCULAR | Status: DC | PRN

## 2015-10-09 MED ORDER — HYDROMORPHONE HCL 1 MG/ML IJ SOLN
0.2500 mg | INTRAMUSCULAR | Status: DC | PRN
  Administered 2015-10-09 (×4): 0.5 mg via INTRAVENOUS

## 2015-10-09 MED ORDER — CEFAZOLIN SODIUM-DEXTROSE 2-4 GM/100ML-% IV SOLN
INTRAVENOUS | Status: AC
  Filled 2015-10-09: qty 100

## 2015-10-09 MED ORDER — FENTANYL CITRATE (PF) 100 MCG/2ML IJ SOLN: 50.0000 ug | INTRAMUSCULAR | Status: DC | PRN

## 2015-10-09 MED ORDER — SCOPOLAMINE 1 MG/3DAYS TD PT72: 1.0000 | MEDICATED_PATCH | Freq: Once | TRANSDERMAL | Status: DC | PRN

## 2015-10-09 MED ORDER — CEFAZOLIN SODIUM-DEXTROSE 2-4 GM/100ML-% IV SOLN
2.0000 g | INTRAVENOUS | Status: AC
  Administered 2015-10-09: 2 g via INTRAVENOUS

## 2015-10-09 MED ORDER — MIDAZOLAM HCL 5 MG/5ML IJ SOLN
INTRAMUSCULAR | Status: DC | PRN
  Administered 2015-10-09: 2 mg via INTRAVENOUS

## 2015-10-09 MED ORDER — PROPOFOL 10 MG/ML IV BOLUS
INTRAVENOUS | Status: AC
  Filled 2015-10-09: qty 40

## 2015-10-09 MED ORDER — BUPIVACAINE-EPINEPHRINE (PF) 0.25% -1:200000 IJ SOLN
INTRAMUSCULAR | Status: DC | PRN
  Administered 2015-10-09: 10 mL

## 2015-10-09 MED ORDER — LIDOCAINE 2% (20 MG/ML) 5 ML SYRINGE
INTRAMUSCULAR | Status: AC
  Filled 2015-10-09: qty 5

## 2015-10-09 MED ORDER — FENTANYL CITRATE (PF) 100 MCG/2ML IJ SOLN
INTRAMUSCULAR | Status: AC
  Filled 2015-10-09: qty 2

## 2015-10-09 MED ORDER — FENTANYL CITRATE (PF) 100 MCG/2ML IJ SOLN
INTRAMUSCULAR | Status: DC | PRN
  Administered 2015-10-09 (×4): 50 ug via INTRAVENOUS

## 2015-10-09 MED ORDER — LACTATED RINGERS IV SOLN
INTRAVENOUS | Status: DC
  Administered 2015-10-09 (×2): via INTRAVENOUS

## 2015-10-09 MED ORDER — ONDANSETRON HCL 4 MG/2ML IJ SOLN
INTRAMUSCULAR | Status: AC
  Filled 2015-10-09: qty 2

## 2015-10-09 MED ORDER — PROPOFOL 10 MG/ML IV BOLUS
INTRAVENOUS | Status: DC | PRN
  Administered 2015-10-09 (×3): 20 mg via INTRAVENOUS

## 2015-10-09 MED ORDER — BUPIVACAINE HCL (PF) 0.25 % IJ SOLN
INTRAMUSCULAR | Status: AC
  Filled 2015-10-09: qty 30

## 2015-10-09 MED ORDER — DEXAMETHASONE SODIUM PHOSPHATE 10 MG/ML IJ SOLN
INTRAMUSCULAR | Status: AC
  Filled 2015-10-09: qty 1

## 2015-10-09 MED ORDER — HYDROMORPHONE HCL 1 MG/ML IJ SOLN
0.5000 mg | INTRAMUSCULAR | Status: DC | PRN
  Administered 2015-10-09: 0.5 mg via INTRAVENOUS

## 2015-10-09 MED ORDER — OXYCODONE-ACETAMINOPHEN 5-325 MG PO TABS
1.0000 | ORAL_TABLET | Freq: Once | ORAL | Status: AC | PRN
  Administered 2015-10-09: 1 via ORAL

## 2015-10-09 MED ORDER — OXYCODONE-ACETAMINOPHEN 10-325 MG PO TABS: 1.0000 | ORAL_TABLET | ORAL | Status: DC | PRN

## 2015-10-09 MED ORDER — MIDAZOLAM HCL 2 MG/2ML IJ SOLN
INTRAMUSCULAR | Status: AC
  Filled 2015-10-09: qty 2

## 2015-10-09 MED ORDER — BUPIVACAINE-EPINEPHRINE (PF) 0.25% -1:200000 IJ SOLN
INTRAMUSCULAR | Status: AC
  Filled 2015-10-09: qty 30

## 2015-10-09 MED ORDER — PROPOFOL 500 MG/50ML IV EMUL
INTRAVENOUS | Status: AC
  Filled 2015-10-09: qty 50

## 2015-10-09 MED ORDER — ONDANSETRON HCL 4 MG/2ML IJ SOLN
INTRAMUSCULAR | Status: DC | PRN
  Administered 2015-10-09: 4 mg via INTRAVENOUS

## 2015-10-09 MED ORDER — LIDOCAINE-EPINEPHRINE 1 %-1:100000 IJ SOLN
INTRAMUSCULAR | Status: AC
  Filled 2015-10-09: qty 1

## 2015-10-09 MED ORDER — PROPOFOL 500 MG/50ML IV EMUL
INTRAVENOUS | Status: DC | PRN
  Administered 2015-10-09: 25 ug/kg/min via INTRAVENOUS

## 2015-10-09 MED ORDER — BUPIVACAINE HCL (PF) 0.5 % IJ SOLN
INTRAMUSCULAR | Status: AC
  Filled 2015-10-09: qty 30

## 2015-10-09 SURGICAL SUPPLY — 78 items
BAG DECANTER FOR FLEXI CONT (MISCELLANEOUS) IMPLANT
BANDAGE ACE 3X5.8 VEL STRL LF (GAUZE/BANDAGES/DRESSINGS) ×3 IMPLANT
BANDAGE ACE 4X5 VEL STRL LF (GAUZE/BANDAGES/DRESSINGS) IMPLANT
BANDAGE ACE 6X5 VEL STRL LF (GAUZE/BANDAGES/DRESSINGS) IMPLANT
BENZOIN TINCTURE PRP APPL 2/3 (GAUZE/BANDAGES/DRESSINGS) ×3 IMPLANT
BLADE SURG 10 STRL SS (BLADE) IMPLANT
BLADE SURG 15 STRL LF DISP TIS (BLADE) ×1 IMPLANT
BLADE SURG 15 STRL SS (BLADE) ×2
BNDG GAUZE ELAST 4 BULKY (GAUZE/BANDAGES/DRESSINGS) ×3 IMPLANT
CANISTER SUCT 1200ML W/VALVE (MISCELLANEOUS) IMPLANT
CHLORAPREP W/TINT 26ML (MISCELLANEOUS) IMPLANT
CLOSURE WOUND 1/2 X4 (GAUZE/BANDAGES/DRESSINGS) ×2
COVER BACK TABLE 60X90IN (DRAPES) ×3 IMPLANT
COVER MAYO STAND STRL (DRAPES) IMPLANT
DECANTER SPIKE VIAL GLASS SM (MISCELLANEOUS) IMPLANT
DRAPE IMP U-DRAPE 54X76 (DRAPES) IMPLANT
DRAPE INCISE IOBAN 66X45 STRL (DRAPES) IMPLANT
DRAPE U-SHAPE 76X120 STRL (DRAPES) ×3 IMPLANT
DRSG ADAPTIC 3X8 NADH LF (GAUZE/BANDAGES/DRESSINGS) ×6 IMPLANT
DRSG EMULSION OIL 3X3 NADH (GAUZE/BANDAGES/DRESSINGS) IMPLANT
DRSG PAD ABDOMINAL 8X10 ST (GAUZE/BANDAGES/DRESSINGS) IMPLANT
ELECT COATED BLADE 2.86 ST (ELECTRODE) IMPLANT
ELECT REM PT RETURN 9FT ADLT (ELECTROSURGICAL) ×3
ELECTRODE REM PT RTRN 9FT ADLT (ELECTROSURGICAL) ×1 IMPLANT
GAUZE SPONGE 4X4 12PLY STRL (GAUZE/BANDAGES/DRESSINGS) IMPLANT
GAUZE XEROFORM 1X8 LF (GAUZE/BANDAGES/DRESSINGS) IMPLANT
GAUZE XEROFORM 5X9 LF (GAUZE/BANDAGES/DRESSINGS) IMPLANT
GLOVE BIO SURGEON STRL SZ 6 (GLOVE) ×3 IMPLANT
GLOVE BIO SURGEON STRL SZ 6.5 (GLOVE) IMPLANT
GLOVE BIO SURGEONS STRL SZ 6.5 (GLOVE)
GLOVE BIOGEL PI IND STRL 7.0 (GLOVE) ×1 IMPLANT
GLOVE BIOGEL PI INDICATOR 7.0 (GLOVE) ×2
GLOVE ECLIPSE 6.5 STRL STRAW (GLOVE) ×3 IMPLANT
GOWN STRL REUS W/ TWL LRG LVL3 (GOWN DISPOSABLE) ×2 IMPLANT
GOWN STRL REUS W/TWL LRG LVL3 (GOWN DISPOSABLE) ×4
NEEDLE HYPO 25X1 1.5 SAFETY (NEEDLE) ×3 IMPLANT
NEEDLE PRECISIONGLIDE 27X1.5 (NEEDLE) IMPLANT
NS IRRIG 1000ML POUR BTL (IV SOLUTION) ×3 IMPLANT
PACK BASIN DAY SURGERY FS (CUSTOM PROCEDURE TRAY) ×3 IMPLANT
PAD CAST 3X4 CTTN HI CHSV (CAST SUPPLIES) IMPLANT
PAD CAST 4YDX4 CTTN HI CHSV (CAST SUPPLIES) IMPLANT
PADDING CAST ABS 3INX4YD NS (CAST SUPPLIES)
PADDING CAST ABS 4INX4YD NS (CAST SUPPLIES)
PADDING CAST ABS COTTON 3X4 (CAST SUPPLIES) IMPLANT
PADDING CAST ABS COTTON 4X4 ST (CAST SUPPLIES) IMPLANT
PADDING CAST COTTON 3X4 STRL (CAST SUPPLIES)
PADDING CAST COTTON 4X4 STRL (CAST SUPPLIES)
PENCIL BUTTON HOLSTER BLD 10FT (ELECTRODE) IMPLANT
PUNCH BIOPSY DERMAL 3MM (MISCELLANEOUS) ×3 IMPLANT
SHEET MEDIUM DRAPE 40X70 STRL (DRAPES) IMPLANT
SLEEVE SCD COMPRESS KNEE MED (MISCELLANEOUS) IMPLANT
SPLINT PLASTER CAST XFAST 3X15 (CAST SUPPLIES) IMPLANT
SPLINT PLASTER XTRA FASTSET 3X (CAST SUPPLIES)
SPONGE GAUZE 4X4 12PLY STER LF (GAUZE/BANDAGES/DRESSINGS) ×6 IMPLANT
SPONGE LAP 18X18 X RAY DECT (DISPOSABLE) ×3 IMPLANT
STAPLER VISISTAT 35W (STAPLE) IMPLANT
STRIP CLOSURE SKIN 1/2X4 (GAUZE/BANDAGES/DRESSINGS) ×4 IMPLANT
SUCTION FRAZIER HANDLE 10FR (MISCELLANEOUS)
SUCTION TUBE FRAZIER 10FR DISP (MISCELLANEOUS) IMPLANT
SURGILUBE 2OZ TUBE FLIPTOP (MISCELLANEOUS) IMPLANT
SUT CHROMIC 5 0 P 3 (SUTURE) ×6 IMPLANT
SUT ETHILON 3 0 PS 1 (SUTURE) IMPLANT
SUT ETHILON 4 0 P 3 18 (SUTURE) IMPLANT
SUT MNCRL AB 4-0 PS2 18 (SUTURE) IMPLANT
SUT PROLENE 3 0 PS 2 (SUTURE) IMPLANT
SUT SILK 3 0 PS 1 (SUTURE) IMPLANT
SUT VIC AB 3-0 FS2 27 (SUTURE) IMPLANT
SUT VIC AB 5-0 PS2 18 (SUTURE) IMPLANT
SUT VICRYL 4-0 PS2 18IN ABS (SUTURE) IMPLANT
SYR BULB IRRIGATION 50ML (SYRINGE) ×3 IMPLANT
SYR CONTROL 10ML LL (SYRINGE) ×3 IMPLANT
TISSUE THERASKIN 2X3 (Tissue) ×3 IMPLANT
TOWEL OR 17X24 6PK STRL BLUE (TOWEL DISPOSABLE) ×6 IMPLANT
TRAY DSU PREP LF (CUSTOM PROCEDURE TRAY) ×3 IMPLANT
TUBE CONNECTING 20'X1/4 (TUBING) ×1
TUBE CONNECTING 20X1/4 (TUBING) ×2 IMPLANT
UNDERPAD 30X30 (UNDERPADS AND DIAPERS) ×3 IMPLANT
YANKAUER SUCT BULB TIP NO VENT (SUCTIONS) ×3 IMPLANT

## 2015-10-09 NOTE — Op Note (Signed)
Operative Note   DATE OF OPERATION: 5.22.17  LOCATION: [place] DIVISION: Plastic Surgery  PREOPERATIVE DIAGNOSES:  1. 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 Sickle cell anemia 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  POSTOPERATIVE DIAGNOSES:  same  PROCEDURE:  1. Surgical preparation for grafting right ankle 40 cm2 2. Application Theraskin [id number]  SURGEON: Irene Limbo MD MBA  ASSISTANT: none  ANESTHESIA:  MAC.   EBL: minimal  COMPLICATIONS: None.   INDICATIONS FOR PROCEDURE:  The patient, [name], is a 45 y.o. female born on [birth year], is here for debridement right ankle ulcers chronic and recurrent. She is unable to tolerate debridement in Wound Clinic. Multiple punch biopsies taken to evaluate for pyoderma gangrenosum.    FINDINGS: Superficial ulcerations over medial and lateral ankle. Measured as cluster of wounds medial ankle 12 x 4.5 cm x 0.2 cm, lateral ankle 3 x 4 x 0.2 cm.  DESCRIPTION OF PROCEDURE:  The patient's operative site was marked with the patient in the preoperative area. The patient was taken to the operating room. IV antibiotics were given. The patient's operative site was prepped and draped in a sterile fashion. A time out was performed and all information was confirmed to be correct. Local anesthetic infiltrated surrounding both medial and lateral ankle ulcerations. Curettage performed to remove all slough from ulcers to remove skin edges of ulcers to bleeding tissue. Four 3 mm punch biopsies taken at border of ulcers from both medial and lateral ankle and sent for specimen.Marland Kitchen Theraskin prepared and applied over entire area of ulceration of medial and lateral malleoli. Total area applied 38.8 cm2.This was secured with interrupted 5-0 chromic. Benzoin applied to peri wound and adaptic guaze placed over Theraskin and secured to skin with steri strips. Dry guaze, kerlix and Ace wrap applied.   The patient was allowed to wake  from anesthesia, extubated and taken to the recovery room in satisfactory condition.   SPECIMENS: punch biopsies, four  DRAINS: none  Irene Limbo, MD [facility] Plastic & Reconstructive Surgery [phone]

## 2015-10-09 NOTE — Anesthesia Procedure Notes (Signed)
Procedure Name: MAC Date/Time: 10/09/2015 7:31 AM Performed by: Marrianne Mood Pre-anesthesia Checklist: Patient identified, Timeout performed, Emergency Drugs available, Suction available and Patient being monitored Patient Re-evaluated:Patient Re-evaluated prior to inductionOxygen Delivery Method: Simple face mask Preoxygenation: Pre-oxygenation with 100% oxygen

## 2015-10-09 NOTE — Transfer of Care (Signed)
Immediate Anesthesia Transfer of Care Note  Patient: [name]  Procedure(s) Performed: Procedure(s): APPLICATION OF THERASKIN (Right) SURGICAL PREPARATION FOR GRAFTING RIGHT ANKLE AND APPLICATION THERASKIN (Right)  Patient Location: PACU  Anesthesia Type:MAC  Level of Consciousness: awake and patient cooperative  Airway & Oxygen Therapy: Patient Spontanous Breathing and Patient connected to face mask oxygen  Post-op Assessment: Report given to RN and Post -op Vital signs reviewed and stable  Post vital signs: Reviewed and stable  Last Vitals:  Filed Vitals:   10/09/15 0623  BP: 115/70  Pulse: 64  Temp: 36.4 C  Resp: 16    Last Pain:  Filed Vitals:   10/09/15 0625  PainSc: 10-Worst pain ever      Patients Stated Pain Goal: 3 ([id number])  Complications: No apparent anesthesia complications

## 2015-10-09 NOTE — Anesthesia Postprocedure Evaluation (Signed)
Anesthesia Post Note  Patient: [name]  Procedure(s) Performed: Procedure(s) (LRB): APPLICATION OF THERASKIN (Right) SURGICAL PREPARATION FOR GRAFTING RIGHT ANKLE AND APPLICATION THERASKIN (Right)  Patient location during evaluation: PACU Anesthesia Type: General Level of consciousness: awake and alert Pain management: pain level controlled Vital Signs Assessment: post-procedure vital signs reviewed and stable Respiratory status: spontaneous breathing, nonlabored ventilation and respiratory function stable Cardiovascular status: blood pressure returned to baseline and stable Postop Assessment: no signs of nausea or vomiting Anesthetic complications: no    Last Vitals:  Filed Vitals:   10/09/15 0930 10/09/15 0945  BP: 111/64 120/75  Pulse: 68 81  Temp:    Resp: 17 15    Last Pain:  Filed Vitals:   10/09/15 1055  PainSc: 5                  Edi Gorniak,W. EDMOND

## 2015-10-09 NOTE — Discharge Instructions (Signed)

## 2015-10-09 NOTE — Interval H&P Note (Signed)
History and Physical Interval Note:  10/09/2015 7:07 AM  [name]  has presented today for surgery, with the diagnosis of NON PRESSURE ULCER RIGHT ANKLE AND SICKLE CELL ANEMIA  The various methods of treatment have been discussed with the patient and family. After consideration of risks, benefits and other options for treatment, the patient has consented to  Procedure(s): APPLICATION OF THERASKIN (Right) SURGICAL PREPARATION FOR GRAFTING RIGHT ANKLE AND APPLICATION THERASKIN (Right) as a surgical intervention .  The patient's history has been reviewed, patient examined, no change in status, stable for surgery.  I have reviewed the patient's chart and labs.  Questions were answered to the patient's satisfaction.     Trask Vosler

## 2015-10-10 ENCOUNTER — Encounter (HOSPITAL_BASED_OUTPATIENT_CLINIC_OR_DEPARTMENT_OTHER): Payer: Self-pay | Admitting: Plastic Surgery

## 2015-10-17 MED FILL — IBUPROFEN 600 MG TABLET: 600 | 20 days supply | Qty: 60 | Fill #0

## 2015-10-25 ENCOUNTER — Encounter (HOSPITAL_BASED_OUTPATIENT_CLINIC_OR_DEPARTMENT_OTHER): Payer: Medicaid Other | Attending: Surgery

## 2015-10-25 DIAGNOSIS — D571 Sickle-cell disease 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: Secondary | ICD-10-CM | POA: Insufficient documentation

## 2015-10-25 DIAGNOSIS — L97312 Non-pressure chronic ulcer of right ankle with fat layer exposed: Secondary | ICD-10-CM | POA: Diagnosis present

## 2015-10-25 DIAGNOSIS — G629 Polyneuropathy, unspecified: Secondary | ICD-10-CM | POA: Insufficient documentation

## 2015-10-25 DIAGNOSIS — L989 Disorder of the skin and subcutaneous tissue, unspecified: Secondary | ICD-10-CM | POA: Diagnosis not present

## 2015-10-31 DIAGNOSIS — L97312 Non-pressure chronic ulcer of right ankle with fat layer exposed: Secondary | ICD-10-CM | POA: Diagnosis not present

## 2015-11-06 ENCOUNTER — Encounter: Payer: Self-pay | Admitting: Family Medicine

## 2015-11-06 ENCOUNTER — Ambulatory Visit (INDEPENDENT_AMBULATORY_CARE_PROVIDER_SITE_OTHER): Payer: Medicaid Other | Admitting: Family Medicine

## 2015-11-06 DIAGNOSIS — D5700 Hb-SS disease with crisis, unspecified: Secondary | ICD-10-CM

## 2015-11-06 DIAGNOSIS — B354 Tinea corporis: Secondary | ICD-10-CM | POA: Diagnosis not present

## 2015-11-06 DIAGNOSIS — R21 Rash and other nonspecific skin eruption: Secondary | ICD-10-CM

## 2015-11-06 LAB — COMPLETE METABOLIC PANEL WITH GFR
ALT: 32 U/L — AB (ref 6–29)
AST: 42 U/L — AB (ref 10–30)
Albumin: 4.3 g/dL (ref 3.6–5.1)
Alkaline Phosphatase: 48 U/L (ref 33–115)
BUN: 6 mg/dL — AB (ref 7–25)
CHLORIDE: 104 mmol/L (ref 98–110)
CO2: 25 mmol/L (ref 20–31)
Calcium: 9.2 mg/dL (ref 8.6–10.2)
Creat: 0.3 mg/dL — ABNORMAL LOW (ref 0.50–1.10)
GFR, Est Non African American: >89 mL/min (ref >=60)
GLUCOSE: 77 mg/dL (ref 65–99)
Potassium: 4.5 mmol/L (ref 3.5–5.3)
SODIUM: 137 mmol/L (ref 135–146)
Total Bilirubin: 4.2 mg/dL — ABNORMAL HIGH (ref 0.2–1.2)
Total Protein: 7.1 g/dL (ref 6.1–8.1)

## 2015-11-06 MED ORDER — OXYCODONE HCL 10 MG PO TABS: 10.0000 mg | ORAL_TABLET | ORAL | Status: DC | PRN

## 2015-11-06 MED ORDER — FLUCONAZOLE 150 MG PO TABS: ORAL_TABLET | ORAL | Status: DC

## 2015-11-06 MED FILL — oxyCODONE HCL 10 MG TABS: 10 | 15 days supply | Qty: 90 | Fill #0

## 2015-11-06 MED FILL — FLUCONAZOLE 150 MG TABLET: 150 | 28 days supply | Qty: 4 | Fill #0

## 2015-11-06 MED FILL — IBUPROFEN 600 MG TABLET: 600 | 20 days supply | Qty: 60 | Fill #1

## 2015-11-06 NOTE — Progress Notes (Signed)
Subjective:    Patient ID: [name], female    DOB: 12-06-1970, 45 y.o.   MRN: [id number]  HPI [name], a 45 year old female with a history of sickle cell anemia, HbSS presents for a 1 month follow up. Patient reports that she has severe pain to bilateral lower extremities. She maintains that pain intensity is 6/10. She last had Percocet 10-325 mg several hours ago with minimal relief. Patient has right lower extremity non pressure ulcer. She received a skin grafting on 10/09/2015. She states that she has completed antibiotic for ulcers. Interventions to date has included debridement and antibiotics. She denies headache, chest pain, shortness of breath, nausea, vomiting diarrhea, or constipation.    Patient complains of rash involvinhg the neck and left posterior deltoid. Rash started several weeks ago. Appearance of rash at onset: round, and pruritis. Rash has spread throughout neck. Her 17 year old son had a similar rash several weeks ago and was treated with a liquid anti-fungal medication.   Discomfort associated with rash: is pruritic.  Denies: abdominal pain, arthralgia, cough, crankiness, decrease in appetite, irritability and sore throat.  Past Medical History  Diagnosis Date  . Sickle cell disease ([place])   . Anemia 03/30/2012    Hx of sickle cell disease  . Leg ulcer ([place]) 10/27/2012    Chronic under care of wound clinic  . Cholelithiasis y-1  . CAP (community acquired pneumonia)     2014  . Anemia complicating pregnancy 123456   Immunization History  Administered Date(s) Administered  . Influenza,inj,Quad PF,36+ Mos 03/16/2013, 01/20/2014, 03/01/2015  . Pneumococcal Polysaccharide-23 03/24/2014  . Tdap 11/25/2013   Social History   Social History  . Marital Status: Married    Spouse Name: [name]  . Number of Children: 1  . Years of Education: N/A   Occupational History  . Employed in home care.    Social History Main Topics  . Smoking  status: Never Smoker   . Smokeless tobacco: Never Used  . Alcohol Use: No  . Drug Use: No  . Sexual Activity: Not Currently    Birth Control/ Protection: None   Other Topics Concern  . Not on file   Social History Narrative   Lives with husband.  No Known Allergies  Review of Systems  Constitutional: Positive for fatigue. Negative for fever.  HENT: Negative.   Eyes: Negative for visual disturbance.  Respiratory: Negative.   Cardiovascular: Negative.   Gastrointestinal: Negative.   Endocrine: Negative.  Negative for polydipsia, polyphagia and polyuria.  Musculoskeletal: Positive for myalgias.       Pain to right lower extremity  Skin: Positive for rash.       Right lower extremity ulcers: recent plastic surgery  Neurological: Negative.   Hematological: Negative.   Psychiatric/Behavioral: Negative.        Objective:   Physical Exam  Constitutional: She is oriented to person, place, and time. She appears well-developed and well-nourished.  HENT:  Head: Normocephalic and atraumatic.  Right Ear: External ear normal.  Left Ear: External ear normal.  Nose: Nose normal.  Mouth/Throat: Oropharynx is clear and moist.  Eyes: Conjunctivae and EOM are normal. Pupils are equal, round, and reactive to light.  Neck: Normal range of motion. Neck supple.  Cardiovascular: Normal rate, normal heart sounds and intact distal pulses.   Pulmonary/Chest: Effort normal and breath sounds normal.  Abdominal: Soft. Bowel sounds are normal.  Musculoskeletal: Normal range of motion.  Neurological: She is alert and  oriented to person, place, and time. She has normal reflexes.  Skin: Skin is warm and dry.        Psychiatric: She has a normal mood and affect. Her behavior is normal. Judgment and thought content normal.      BP 118/56 mmHg  Pulse 66  Temp(Src) 98.1 F (36.7 C) (Oral)  Resp 16  Ht 5\' 7"  (1.702 m)  Wt 121 lb (54.885 kg)  BMI 18.95 kg/m2  SpO2 92%  LMP 10/27/2015       Assessment & Plan:  1. Hb-SS disease 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 ([place]) Acute and chronic painful episodes -  Will discontinue Percocet 10-325 mg and will start Oxycodone every 4 hours as needed for moderate to severe chronic pain. [name] has been having increased pain since stasis ulcers have resurfaced. She is currently having dressings changes at the wound center weekly. We will discuss titration of current dosage during next office visit. Discontinued Percocet due to elevated AST.  We discussed that pt is to receive her Schedule II prescriptions only from [place]. Pt is also aware that the prescription history is available to [place] online through the [facility] CSRS. Controlled substance agreement signed previously. We reminded [name] that all patients receiving Schedule II narcotics must be seen for follow within one month of prescription being requested. We reviewed the terms of our pain agreement, including the need to keep medicines in a safe locked location away from children or pets, and the need to report excess sedation or constipation, measures to avoid constipation, and policies related to early refills and stolen prescriptions. According to the [place] Chronic Pain Initiative program, we have reviewed details related to analgesia, adverse effects, aberrant behaviors. Reviewed [place] Substance Reporting system prior to prescribing opiate medications.   - Oxycodone HCl 10 MG TABS; Take 1 tablet (10 mg total) by mouth every 4 (four) hours as needed.  Dispense: 90 tablet; Refill: 0 - CBC with Differential - COMPLETE METABOLIC PANEL WITH GFR  2. Tinea corporis Directions: 1. Wash affected area well and towel dry. 2. Apply a layer of Blue Star Ointment to affected area. 3. Repeat ringworm cream 3 times daily. 4. Wash your bed linens, clothing and nightclothes regularly for up to a week after the infection is gone.  - fluconazole (DIFLUCAN) 150 MG tablet; Take 1 tablet weekly for 4 weeks  Dispense: 4 tablet; Refill:  0     RTC: 1 month for sickle cell anemia and chronic pain management  The patient was given clear instructions to go to ER or return to medical center if symptoms do not improve, worsen or new problems develop. The patient verbalized understanding. Will notify patient with laboratory results.   [name], FNP

## 2015-11-06 NOTE — Patient Instructions (Addendum)
Wash with [facility].  Do not share towels Change your pillow case and sheets   Blue Star Ointment   Directions: 1. Wash affected area well and towel dry. 2. Apply a layer of Blue Star Ointment to affected area. 3. Repeat ringworm cream 3 times daily. 4. Wash your bed linens, clothing and nightclothes regularly for up to a week after the infection is gone.

## 2015-11-07 ENCOUNTER — Telehealth: Payer: Self-pay | Admitting: Hematology

## 2015-11-07 LAB — CBC WITH DIFFERENTIAL/PLATELET
BASOS PCT: 1 %
Basophils Absolute: 128 cells/uL (ref 0–200)
EOS PCT: 3 %
Eosinophils Absolute: 384 cells/uL (ref 15–500)
HCT: 18.8 % — ABNORMAL LOW (ref 35.0–45.0)
Hemoglobin: 6.8 g/dL — CL (ref 11.7–15.5)
LYMPHS PCT: 21 %
Lymphs Abs: 2688 cells/uL (ref 850–3900)
MCH: 30.9 pg (ref 27.0–33.0)
MCHC: 35.1 g/dL (ref 32.0–36.0)
MCV: 85.5 fL (ref 80.0–100.0)
MONOS PCT: 9 %
MPV: 9 fL (ref 7.5–12.5)
Monocytes Absolute: 1152 cells/uL — ABNORMAL HIGH (ref 200–950)
NEUTROS ABS: 8448 {cells}/uL — AB (ref 1500–7800)
Neutrophils Relative %: 66 %
PLATELETS: 432 10*3/uL — AB (ref 140–400)
RBC: 2.2 MIL/uL — AB (ref 3.80–5.10)
RDW: 24.6 % — AB (ref 11.0–15.0)
WBC: 12.8 10*3/uL — AB (ref 3.8–10.8)

## 2015-11-07 NOTE — Telephone Encounter (Signed)
Critical value alert  Hgb: 6.8 Read back and verified / [place] Hollis notified verbally

## 2015-11-08 DIAGNOSIS — L97312 Non-pressure chronic ulcer of right ankle with fat layer exposed: Secondary | ICD-10-CM | POA: Diagnosis not present

## 2015-11-15 DIAGNOSIS — L97312 Non-pressure chronic ulcer of right ankle with fat layer exposed: Secondary | ICD-10-CM | POA: Diagnosis not present

## 2015-11-22 ENCOUNTER — Encounter (HOSPITAL_BASED_OUTPATIENT_CLINIC_OR_DEPARTMENT_OTHER): Payer: Medicaid Other

## 2015-11-22 ENCOUNTER — Encounter (HOSPITAL_BASED_OUTPATIENT_CLINIC_OR_DEPARTMENT_OTHER): Payer: Medicaid Other | Attending: Surgery

## 2015-11-23 DIAGNOSIS — D571 Sickle-cell disease 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: Secondary | ICD-10-CM | POA: Diagnosis not present

## 2015-11-23 DIAGNOSIS — L97311 Non-pressure chronic ulcer of right ankle limited to breakdown of skin: Secondary | ICD-10-CM | POA: Insufficient documentation

## 2015-11-23 DIAGNOSIS — G629 Polyneuropathy, unspecified: Secondary | ICD-10-CM | POA: Insufficient documentation

## 2015-11-30 DIAGNOSIS — L97311 Non-pressure chronic ulcer of right ankle limited to breakdown of skin: Secondary | ICD-10-CM | POA: Diagnosis not present

## 2015-12-06 DIAGNOSIS — L97311 Non-pressure chronic ulcer of right ankle limited to breakdown of skin: Secondary | ICD-10-CM | POA: Diagnosis not present

## 2015-12-13 DIAGNOSIS — L97311 Non-pressure chronic ulcer of right ankle limited to breakdown of skin: Secondary | ICD-10-CM | POA: Diagnosis not present

## 2015-12-14 ENCOUNTER — Ambulatory Visit (INDEPENDENT_AMBULATORY_CARE_PROVIDER_SITE_OTHER): Payer: Medicaid Other | Admitting: Family Medicine

## 2015-12-14 ENCOUNTER — Encounter: Payer: Self-pay | Admitting: Family Medicine

## 2015-12-14 VITALS — BP 112/65 | HR 90 | Temp 98.4°F | Resp 14 | Ht 67.0 in | Wt 115.0 lb

## 2015-12-14 DIAGNOSIS — D5700 Hb-SS disease with crisis, unspecified: Secondary | ICD-10-CM

## 2015-12-14 DIAGNOSIS — B354 Tinea corporis: Secondary | ICD-10-CM | POA: Diagnosis not present

## 2015-12-14 MED ORDER — KETOCONAZOLE 2 % EX CREA: TOPICAL_CREAM | Freq: Two times a day (BID) | CUTANEOUS | Status: DC

## 2015-12-14 MED ORDER — PNEUMOCOCCAL 13-VAL CONJ VACC IM SUSP
0.5000 mL | INTRAMUSCULAR | Status: AC | PRN
  Administered 2015-12-14: 0.5 mL via INTRAMUSCULAR

## 2015-12-14 MED ORDER — KETOCONAZOLE 2 % EX CREA: 1.0000 "application " | TOPICAL_CREAM | Freq: Every day | CUTANEOUS | 2 refills | Status: DC

## 2015-12-14 MED ORDER — OXYCODONE HCL 10 MG PO TABS: 10.0000 mg | ORAL_TABLET | ORAL | 0 refills | Status: DC | PRN

## 2015-12-14 MED FILL — oxyCODONE HCL 10 MG TABS: 10 | 15 days supply | Qty: 90 | Fill #0

## 2015-12-14 MED FILL — IBUPROFEN 600 MG TABLET: 600 | 20 days supply | Qty: 60 | Fill #2

## 2015-12-14 MED FILL — KETOCONAZOLE 2% CREAM: 2 | 30 days supply | Qty: 60 | Fill #0

## 2015-12-14 NOTE — Progress Notes (Addendum)
[name], is a 45 y.o. female  [id number]  [id number]  DOB - 1971-04-21  CC:  Chief Complaint  Patient presents with  . Follow-up    scd   . Medication Refill    pain medication        HPI: [name] is a 45 y.o. female here to follow-up SCD. She was seen and had bloodwork by Mrs. Smith Robert about one month ago. She recently has had surgery on her right lower leg ulcers and is followed by [place]. She reports pain in her right lower leg. She is needing a refill of her Oxycodone for pain. He has a history of AVN and hip pain.   She denies chest pain, dyspnea, N/V/D.headaches. She is in need of Pneum 13.  No Known Allergies Past Medical History:  Diagnosis Date  . Anemia 03/30/2012   Hx of sickle cell disease  . Anemia complicating pregnancy 123456  . CAP (community acquired pneumonia)    2014  . Cholelithiasis y-1  . Leg ulcer ([place]) 10/27/2012   Chronic under care of wound clinic  . Sickle cell disease ([place])    Current Outpatient Prescriptions on File Prior to Visit  Medication Sig Dispense Refill  . Cholecalciferol (VITAMIN D) 2000 UNITS CAPS Take 1 capsule (2,000 Units total) by mouth daily. 30 capsule 11  . folic acid (FOLVITE) 1 MG tablet Take 1 tablet (1 mg total) by mouth daily. 30 tablet 11  . ibuprofen (ADVIL,MOTRIN) 600 MG tablet TAKE 1 TABLET BY MOUTH EVERY 8 HOURS AS NEEDED FOR MILD PAIN. 60 tablet 3  . doxycycline (VIBRAMYCIN) 100 MG capsule Take 100 mg by mouth 2 (two) times daily. Reported on 11/06/2015    . fluconazole (DIFLUCAN) 150 MG tablet Take 1 tablet weekly for 4 weeks (Patient not taking: Reported on 12/14/2015) 4 tablet 0   No current facility-administered medications on file prior to visit.    Family History  Problem Relation Age of Onset  . Sickle cell anemia Sister   . Diabetes Mother    Social History   Social History  . Marital status: Married    Spouse name: [name]  . Number of children: 1  .  Years of education: N/A   Occupational History  . Employed in home care.    Social History Main Topics  . Smoking status: Never Smoker  . Smokeless tobacco: Never Used  . Alcohol use No  . Drug use: No  . Sexual activity: Not Currently    Birth control/ protection: None   Other Topics Concern  . Not on file   Social History Narrative   Lives with husband.    Review of Systems: Constitutional: Negative for fever, chills, appetite change, weight loss,  Fatigue. Skin: rash on neck and upper arm on left, consistent with tinea. HENT: Negative for ear pain, ear discharge.nose bleeds Eyes: Negative for pain, discharge, redness, itching and visual disturbance. Neck: Negative for pain, stiffness Respiratory: Negative for cough, shortness of breath,   Cardiovascular: Negative for chest pain, palpitations and leg swelling. Gastrointestinal: Negative for abdominal pain, nausea, vomiting, diarrhea, constipations Genitourinary: Negative for dysuria, urgency, frequency, hematuria,  Musculoskeletal: Negative for back pain, joint pain, joint  swelling, and gait problem.Negative for weakness.Positive for lower leg pain Neurological: Negative for dizziness, tremors, seizures, syncope,   light-headedness, numbness and headaches.  Hematological: Negative for easy bruising or bleeding Psychiatric/Behavioral: Negative for depression, anxiety, decreased concentration, confusion   Objective:   Vitals:  12/14/15 1040  BP: 112/65  Pulse: 90  Resp: 14  Temp: 98.4 F (36.9 C)    Physical Exam: Constitutional: Patient appears well-developed and well-nourished. No distress. HENT: Normocephalic, atraumatic, External right and left ear normal. Oropharynx is clear and moist.  Eyes: Conjunctivae and EOM are normal. PERRLA, no scleral icterus. Neck: Normal ROM. Neck supple. No lymphadenopathy, No thyromegaly. CVS: RRR, S1/S2 +, no murmurs, no gallops, no rubs Pulmonary: Effort and breath sounds  normal, no stridor, rhonchi, wheezes, rales.  Abdominal: Soft. Normoactive BS,, no distension, tenderness, rebound or guarding.  Musculoskeletal: Normal range of motion. No edema and no tenderness.  Neuro: Alert.Normal muscle tone coordination. Non-focal Skin: Skin is warm and dry. There is a scattered maculopapular rash with some scaling consistent with tinea. Psychiatric: Normal mood and affect. Behavior, judgment, thought content normal.  Lab Results  Component Value Date   WBC 12.8 (H) 11/06/2015   HGB 6.8 (LL) 11/06/2015   HCT 18.8 (L) 11/06/2015   MCV 85.5 11/06/2015   PLT 432 (H) 11/06/2015   Lab Results  Component Value Date   CREATININE 0.30 (L) 11/06/2015   BUN 6 (L) 11/06/2015   NA 137 11/06/2015   K 4.5 11/06/2015   CL 104 11/06/2015   CO2 25 11/06/2015    No results found for: HGBA1C Lipid Panel     Component Value Date/Time   CHOL 143 11/25/2014 1521   TRIG 137 11/25/2014 1521   HDL 27 (L) 11/25/2014 1521   CHOLHDL 5.3 11/25/2014 1521   VLDL 27 11/25/2014 1521   LDLCALC 89 11/25/2014 1521       Assessment and plan:   1. Sickle cell anemia with pain (HCC)  - pneumococcal 13-valent conjugate vaccine (PREVNAR 13) injection 0.5 mL; Inject 0.5 mLs into the muscle Prior to discharge for immunization. - Oxycodone HCl 10 MG TABS; Take 1 tablet (10 mg total) by mouth every 4 (four) hours as needed.  Dispense: 90 tablet; Refill: 0  2. Tinea corporis - ketoconazole (NIZORAL) 2 % cream; Apply topically 2 (two) times daily.    Routine follow-up one month.  The patient was given clear instructions to go to ER or return to medical center if symptoms don't improve, worsen or new problems develop. The patient verbalized understanding.    [name] FNP  12/14/2015, 11:08 AM

## 2015-12-19 ENCOUNTER — Encounter (HOSPITAL_BASED_OUTPATIENT_CLINIC_OR_DEPARTMENT_OTHER): Payer: Medicaid Other | Attending: Surgery

## 2015-12-19 DIAGNOSIS — L97311 Non-pressure chronic ulcer of right ankle limited to breakdown of skin: Secondary | ICD-10-CM | POA: Diagnosis present

## 2015-12-19 DIAGNOSIS — D571 Sickle-cell disease 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: Secondary | ICD-10-CM | POA: Diagnosis not present

## 2015-12-27 DIAGNOSIS — L97311 Non-pressure chronic ulcer of right ankle limited to breakdown of skin: Secondary | ICD-10-CM | POA: Diagnosis not present

## 2016-01-04 DIAGNOSIS — L97311 Non-pressure chronic ulcer of right ankle limited to breakdown of skin: Secondary | ICD-10-CM | POA: Diagnosis not present

## 2016-01-11 MED FILL — IBUPROFEN 600 MG TABLET: 600 | 20 days supply | Qty: 60 | Fill #3

## 2016-01-16 ENCOUNTER — Ambulatory Visit (INDEPENDENT_AMBULATORY_CARE_PROVIDER_SITE_OTHER): Payer: Medicaid Other | Admitting: Family Medicine

## 2016-01-16 ENCOUNTER — Encounter: Payer: Self-pay | Admitting: Family Medicine

## 2016-01-16 VITALS — BP 121/68 | HR 84 | Temp 98.6°F | Resp 18 | Ht 67.0 in | Wt 121.0 lb

## 2016-01-16 DIAGNOSIS — Z114 Encounter for screening for human immunodeficiency virus [HIV]: Secondary | ICD-10-CM

## 2016-01-16 DIAGNOSIS — D571 Sickle-cell disease 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: Secondary | ICD-10-CM

## 2016-01-16 DIAGNOSIS — D5700 Hb-SS disease with crisis, unspecified: Secondary | ICD-10-CM

## 2016-01-16 DIAGNOSIS — L97311 Non-pressure chronic ulcer of right ankle limited to breakdown of skin: Secondary | ICD-10-CM | POA: Diagnosis not present

## 2016-01-16 LAB — CBC WITH DIFFERENTIAL/PLATELET
BASOS ABS: 149 {cells}/uL (ref 0–200)
Basophils Relative: 1 %
EOS ABS: 298 {cells}/uL (ref 15–500)
Eosinophils Relative: 2 %
HEMATOCRIT: 17.8 % — AB (ref 35.0–45.0)
Hemoglobin: 6.3 g/dL — ABNORMAL LOW (ref 11.7–15.5)
LYMPHS PCT: 15 %
Lymphs Abs: 2235 cells/uL (ref 850–3900)
MCH: 29.3 pg (ref 27.0–33.0)
MCHC: 35.4 g/dL (ref 32.0–36.0)
MCV: 82.8 fL (ref 80.0–100.0)
MONO ABS: 1490 {cells}/uL — AB (ref 200–950)
MONOS PCT: 10 %
MPV: 9.1 fL (ref 7.5–12.5)
NEUTROS PCT: 72 %
Neutro Abs: 10728 cells/uL (ref 1500–7800)
PLATELETS: 561 10*3/uL — AB (ref 140–400)
RBC: 2.15 MIL/uL — ABNORMAL LOW (ref 3.80–5.10)
RDW: 21.1 % — AB (ref 11.0–15.0)
WBC: 14.9 10*3/uL — ABNORMAL HIGH (ref 3.8–10.8)

## 2016-01-16 LAB — RETICULOCYTES
ABS RETIC: 333250 {cells}/uL — AB (ref 20000–80000)
RBC.: 2.15 MIL/uL — ABNORMAL LOW (ref 3.80–5.10)
Retic Ct Pct: 15.5 %

## 2016-01-16 MED ORDER — OXYCODONE HCL 10 MG PO TABS: 10.0000 mg | ORAL_TABLET | ORAL | 0 refills | Status: DC | PRN

## 2016-01-16 MED ORDER — IBUPROFEN 600 MG PO TABS: ORAL_TABLET | ORAL | 3 refills | Status: DC

## 2016-01-16 MED FILL — oxyCODONE HCL 10 MG TABS: 10 | 14 days supply | Qty: 84 | Fill #0

## 2016-01-16 NOTE — Progress Notes (Signed)
Patient is here for 1 month FU  Patient complains of right foot pain being present. Pain is described as burning.  Patient has taken medication and patient has eaten today.

## 2016-01-16 NOTE — Progress Notes (Signed)
[name], is a 45 y.o. female  [id number]  [id number]  DOB - 1970-09-06  CC:  Chief Complaint  Patient presents with  . Follow-up       HPI: [name] is a 45 y.o. female here for follow-up sickle cell. She denies sickle cell pain  When she does have  pain is is in her arms and legs. . Her only pain is in her entire right leg related to a wound on her foot. She has an appointment at [facility] later today. She reports she rarely has sickle cell crises. She does not have to be admitted for crises. 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 She is needing a refill on oxycodone.   She denies chest pain, fever, chills, rashes, palpitation, dyspnea, dysuria.  She denies  No Known Allergies Past Medical History:  Diagnosis Date  . Anemia 03/30/2012   Hx of sickle cell disease  . Anemia complicating pregnancy 123456  . CAP (community acquired pneumonia)    2014  . Cholelithiasis y-1  . Leg ulcer ([place]) 10/27/2012   Chronic under care of wound clinic  . Sickle cell disease ([place])    Current Outpatient Prescriptions on File Prior to Visit  Medication Sig Dispense Refill  . Cholecalciferol (VITAMIN D) 2000 UNITS CAPS Take 1 capsule (2,000 Units total) by mouth daily. 30 capsule 11  . folic acid (FOLVITE) 1 MG tablet Take 1 tablet (1 mg total) by mouth daily. 30 tablet 11  . ketoconazole (NIZORAL) 2 % cream Apply 1 application topically daily. 60 g 2   No current facility-administered medications on file prior to visit.    Family History  Problem Relation Age of Onset  . Sickle cell anemia Sister   . Diabetes Mother    Social History   Social History  . Marital status: Married    Spouse name: [name]  . Number of children: 1  . Years of education: N/A   Occupational History  . Employed in home care.    Social History Main Topics  . Smoking status: Never Smoker  . Smokeless tobacco: Never Used  . Alcohol use No  . Drug use: No   . Sexual activity: Not Currently    Birth control/ protection: None   Other Topics Concern  . Not on file   Social History Narrative   Lives with husband.    Review of Systems: Constitutional: Negative for fever, chills, appetite change, weight loss,  Fatigue. Skin: Negative for rashes or lesions of concern. Foot wound HENT: Negative for ear pain, ear discharge.nose bleeds Eyes: Negative for pain, discharge, redness, itching and visual disturbance. Neck: Negative for pain, stiffness Respiratory: Negative for cough, shortness of breath,   Cardiovascular: Negative for chest pain, palpitations and leg swelling. Gastrointestinal: Negative for abdominal pain, nausea, vomiting, diarrhea, constipations Genitourinary: Negative for dysuria, urgency, frequency, hematuria,  Musculoskeletal: Negative for back pain, joint pain, joint  swelling, and gait problem.Negative for weakness. Neurological: Negative for dizziness, tremors, seizures, syncope,   light-headedness, numbness and headaches.  Hematological: Negative for easy bruising or bleeding Psychiatric/Behavioral: Negative for depression, anxiety, decreased concentration, confusion   Objective:   Vitals:   01/16/16 0944  BP: 121/68  Pulse: 84  Resp: 18  Temp: 98.6 F (37 C)    Physical Exam: Constitutional: Patient appears well-developed and well-nourished. No distress. HENT: Normocephalic, atraumatic, External right and left ear normal. Oropharynx is clear and moist.  Eyes: Conjunctivae and EOM are  normal. PERRLA, no scleral icterus. Neck: Normal ROM. Neck supple. No lymphadenopathy, No thyromegaly. CVS: RRR, S1/S2 +, no murmurs, no gallops, no rubs Pulmonary: Effort and breath sounds normal, no stridor, rhonchi, wheezes, rales.  Abdominal: Soft. Normoactive BS,, no distension, tenderness, rebound or guarding.  Musculoskeletal: Normal range of motion. No edema and no tenderness.  Neuro: Alert.Normal muscle tone coordination.  Non-focal Skin: Skin is warm and dry. No rash noted. Not diaphoretic. No erythema. No pallor. Ulcerative wound on right foot, which is dressed Psychiatric: Normal mood and affect. Behavior, judgment, thought content normal.  Lab Results  Component Value Date   WBC 12.8 (H) 11/06/2015   HGB 6.8 (LL) 11/06/2015   HCT 18.8 (L) 11/06/2015   MCV 85.5 11/06/2015   PLT 432 (H) 11/06/2015   Lab Results  Component Value Date   CREATININE 0.30 (L) 11/06/2015   BUN 6 (L) 11/06/2015   NA 137 11/06/2015   K 4.5 11/06/2015   CL 104 11/06/2015   CO2 25 11/06/2015    No results found for: HGBA1C Lipid Panel     Component Value Date/Time   CHOL 143 11/25/2014 1521   TRIG 137 11/25/2014 1521   HDL 27 (L) 11/25/2014 1521   CHOLHDL 5.3 11/25/2014 1521   VLDL 27 11/25/2014 1521   LDLCALC 89 11/25/2014 1521       Assessment and plan:   1. Sickle cell anemia with pain (HCC)  - Oxycodone HCl 10 MG TABS; Take 1 tablet (10 mg total) by mouth every 4 (four) hours as needed.  Dispense: 90 tablet; Refill: 0  2. Hb-SS disease 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 ([place])  - CBC with Differential - Reticulocytes  3. Screening for HIV (human immunodeficiency virus)  - HIV antibody (with reflex)   Return in about 1 month (around 02/16/2016) for SCD.  The patient was given clear instructions to go to ER or return to medical center if symptoms don't improve, worsen or new problems develop. The patient verbalized understanding.    [name] FNP  01/16/2016, 11:13 AM

## 2016-01-17 LAB — HIV ANTIBODY (ROUTINE TESTING W REFLEX): HIV 1&2 Ab, 4th Generation: NONREACTIVE

## 2016-01-23 ENCOUNTER — Encounter (HOSPITAL_BASED_OUTPATIENT_CLINIC_OR_DEPARTMENT_OTHER): Payer: Medicaid Other | Attending: Surgery

## 2016-01-23 DIAGNOSIS — L97811 Non-pressure chronic ulcer of other part of right lower leg limited to breakdown of skin: Secondary | ICD-10-CM | POA: Insufficient documentation

## 2016-02-06 DIAGNOSIS — L97811 Non-pressure chronic ulcer of other part of right lower leg limited to breakdown of skin: Secondary | ICD-10-CM | POA: Diagnosis not present

## 2016-02-06 MED FILL — IBUPROFEN 600 MG TABLET: 600 | 20 days supply | Qty: 60 | Fill #0

## 2016-02-13 ENCOUNTER — Telehealth: Payer: Self-pay

## 2016-02-13 NOTE — Telephone Encounter (Signed)
Refill request for oxycodone. LOV was 01/16/2016. Please advise. Thanks!

## 2016-02-15 ENCOUNTER — Other Ambulatory Visit: Payer: Self-pay | Admitting: Internal Medicine

## 2016-02-15 DIAGNOSIS — D5700 Hb-SS disease with crisis, unspecified: Secondary | ICD-10-CM

## 2016-02-15 MED ORDER — OXYCODONE HCL 10 MG PO TABS: 10.0000 mg | ORAL_TABLET | ORAL | 0 refills | Status: DC | PRN

## 2016-02-16 MED FILL — oxyCODONE HCL 10 MG TABS: 10 | 14 days supply | Qty: 84 | Fill #0

## 2016-02-19 ENCOUNTER — Ambulatory Visit (INDEPENDENT_AMBULATORY_CARE_PROVIDER_SITE_OTHER): Payer: Medicaid Other | Admitting: Family Medicine

## 2016-02-19 ENCOUNTER — Encounter: Payer: Self-pay | Admitting: Family Medicine

## 2016-02-19 VITALS — BP 100/66 | HR 80 | Temp 98.3°F | Resp 16 | Ht 67.0 in | Wt 124.0 lb

## 2016-02-19 DIAGNOSIS — Z1239 Encounter for other screening for malignant neoplasm of breast: Secondary | ICD-10-CM

## 2016-02-19 DIAGNOSIS — Z1231 Encounter for screening mammogram for malignant neoplasm of breast: Secondary | ICD-10-CM

## 2016-02-19 DIAGNOSIS — Z23 Encounter for immunization: Secondary | ICD-10-CM

## 2016-02-19 DIAGNOSIS — D571 Sickle-cell disease 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: Secondary | ICD-10-CM

## 2016-02-19 NOTE — Progress Notes (Signed)
[name], is a 45 y.o. female  [id number]  [id number]  DOB - May 16, 1971  CC:  Chief Complaint  Patient presents with  . Follow-up    sickle cell follow up, no pain related to SCD   . Leg Pain    chronic wound of extremity, uses narcotics every 4-6 hours       HPI: [name] is a 45 y.o. female here sickle cell follow-up. She reports no change in her [place] status since her last visit a month ago. She reports having rare [place] pain but has chronic pain related to a chronic wound on her right foot. She is followed by [facility] for this and has an appt next week. She has been admitted once this year for pain management in May.  She does take her oxycodone every 4 hours for her chronic pain related to the wound.   Health Maintenance: Reports normal PAP two years ago. Needs mammogram and flu shot.  No Known Allergies Past Medical History:  Diagnosis Date  . Anemia 03/30/2012   Hx of sickle cell disease  . Anemia complicating pregnancy 123456  . CAP (community acquired pneumonia)    2014  . Cholelithiasis y-1  . Leg ulcer ([place]) 10/27/2012   Chronic under care of wound clinic  . Sickle cell disease ([place])    Current Outpatient Prescriptions on File Prior to Visit  Medication Sig Dispense Refill  . Cholecalciferol (VITAMIN D) 2000 UNITS CAPS Take 1 capsule (2,000 Units total) by mouth daily. 30 capsule 11  . folic acid (FOLVITE) 1 MG tablet Take 1 tablet (1 mg total) by mouth daily. 30 tablet 11  . ibuprofen (ADVIL,MOTRIN) 600 MG tablet TAKE 1 TABLET BY MOUTH EVERY 8 HOURS AS NEEDED FOR MILD PAIN. 60 tablet 3  . ketoconazole (NIZORAL) 2 % cream Apply 1 application topically daily. 60 g 2  . Oxycodone HCl 10 MG TABS Take 1 tablet (10 mg total) by mouth every 4 (four) hours as needed. 90 tablet 0   No current facility-administered medications on file prior to visit.    Family History  Problem Relation Age of Onset  . Sickle cell anemia Sister   . Diabetes  Mother    Social History   Social History  . Marital status: Married    Spouse name: [name]  . Number of children: 1  . Years of education: N/A   Occupational History  . Employed in home care.    Social History Main Topics  . Smoking status: Never Smoker  . Smokeless tobacco: Never Used  . Alcohol use No  . Drug use: No  . Sexual activity: Not Currently    Birth control/ protection: None   Other Topics Concern  . Not on file   Social History Narrative   Lives with husband.    Review of Systems: Constitutional: Negative for fever, chills, weight loss, appetite loss HENT: Denies Problems Eyes: Denies problems Neck: Denies problems Respiratory: Negative for cough, shortness of breath,   Cardiovascular: Negative for chest pain, palpitations and leg swelling. Gastrointestinal: Negative for abdominal pain, nausea,vomitng, diarrhea, constipation. Genitourinary: Denies problems Musculoskeletal: Denies problems Neurological: Denies problems Hematological: Denies problems Psychiatric/Behavioral: Denies depression, anxiety. Skin: Healing sores on lower legs   Objective:   Vitals:   02/19/16 0932  BP: 100/66  Pulse: 80  Resp: 16  Temp: 98.3 F (36.8 C)    Physical Exam: Constitutional: Patient appears well-developed and well-nourished. No distress. HENT: Normocephalic, atraumatic, External  right and left ear normal. Oropharynx is clear and moist.  Eyes: Conjunctivae and EOM are normal. PERRLA, no scleral icterus. Neck: Normal ROM. Neck supple. No lymphadenopathy, No thyromegaly. CVS: RRR, S1/S2 +,  no gallops, no rubs. 5/6 systolic murmur Pulmonary: Effort and breath sounds normal, no stridor, rhonchi, wheezes, rales.  Abdominal: Soft. Normoactive BS,, no distension, tenderness, rebound or guarding.  Musculoskeletal: Normal range of motion. No edema and no tenderness.  Neuro: Alert.Normal muscle tone coordination. Non-focal Skin: Skin is warm and dry.  No rash noted. Not diaphoretic. No erythema. No pallor. Healing sores on lower legs. Psychiatric: Normal mood and affect. Behavior, judgment, thought content normal.  Lab Results  Component Value Date   WBC 14.9 (H) 01/16/2016   HGB 6.3 (L) 01/16/2016   HCT 17.8 (L) 01/16/2016   MCV 82.8 01/16/2016   PLT 561 (H) 01/16/2016   Lab Results  Component Value Date   CREATININE 0.30 (L) 11/06/2015   BUN 6 (L) 11/06/2015   NA 137 11/06/2015   K 4.5 11/06/2015   CL 104 11/06/2015   CO2 25 11/06/2015    No results found for: HGBA1C Lipid Panel     Component Value Date/Time   CHOL 143 11/25/2014 1521   TRIG 137 11/25/2014 1521   HDL 27 (L) 11/25/2014 1521   CHOLHDL 5.3 11/25/2014 1521   VLDL 27 11/25/2014 1521   LDLCALC 89 11/25/2014 1521       Assessment and plan:   1. Hb-SS disease 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 ([place]) -Continue current treatment -Keep medications locked safely away -Stay well hydrated  2. Screening for breast cancer  - MM DIGITAL SCREENING BILATERAL; Future  3. Needs flu shot  - Flu Vaccine QUAD 36+ mos PF IM (Fluarix & Fluzone Quad PF)   -Sickle Cell Disease  Patient counseled and given handout on use of opoid pain medications, including need to only get from [place] and our ability to follow use on [place]  CSRS and need to keep in a safe locked place away from children and pets. Have reviewed our refill policy related to erly refills if lost or stolen. Have reviewed possible side effects of opoids, Hydrea and need to take other SCD related medications as ordered.  Have review health maintenance needs, including immunizations, urine for proteim, dilated eye exam.  Have review the importance of smoking cessation if currently smoking.   The patient was given clear instructions to go to ER or return to medical center if symptoms don't improve, worsen or new problems develop. The patient verbalized understanding. The patient was told to call to get lab results if they haven't  heard anything in the next week.     Return in about 1 month (around 03/21/2016).       [name], MSN, FNP-BC   02/19/2016, 10:18 AM

## 2016-02-19 NOTE — Patient Instructions (Signed)
Remember to stay well hydrated Keep medications safely locked away No narcotic medications from anyone but [place].  Follow-up at wound clinic as planned May get a refill of narcotic 15 days from last refill.

## 2016-02-28 ENCOUNTER — Other Ambulatory Visit: Payer: Self-pay | Admitting: Family Medicine

## 2016-02-28 ENCOUNTER — Telehealth: Payer: Self-pay

## 2016-02-28 DIAGNOSIS — D5700 Hb-SS disease with crisis, unspecified: Secondary | ICD-10-CM

## 2016-02-28 MED ORDER — OXYCODONE HCL 10 MG PO TABS: 10.0000 mg | ORAL_TABLET | ORAL | 0 refills | Status: DC | PRN

## 2016-02-28 NOTE — Telephone Encounter (Signed)
Refill request for oxycodone. LOV 02/19/2016. Please advise. [employer]

## 2016-02-29 ENCOUNTER — Telehealth: Payer: Self-pay

## 2016-02-29 NOTE — Telephone Encounter (Signed)
Called and scheduled an appointment for patient. First available appointment was for 05/17/2016 @ 8:15am. this is with Dr. Clovis Riley with [facility] health Dermatology. Patient was called and made aware of appointment. Thanks!

## 2016-03-01 MED FILL — oxyCODONE HCL 10 MG TABS: 10 | 14 days supply | Qty: 84 | Fill #0

## 2016-03-01 MED FILL — IBUPROFEN 600 MG TABLET: 600 | 20 days supply | Qty: 60 | Fill #1

## 2016-03-13 MED FILL — MUPIROCIN 2% OINTMENT: 2 | 30 days supply | Qty: 66 | Fill #0

## 2016-03-13 MED FILL — CLOBETASOL 0.05% OINTMENT: 0.05 | 30 days supply | Qty: 60 | Fill #0

## 2016-03-13 MED FILL — DOXYCYCLINE HYCLATE 100 MG: 100 | 21 days supply | Qty: 42 | Fill #0

## 2016-03-13 MED FILL — hydrOXYzine HCL 10 MG TABS: 10 | 7 days supply | Qty: 30 | Fill #0

## 2016-03-21 ENCOUNTER — Ambulatory Visit (INDEPENDENT_AMBULATORY_CARE_PROVIDER_SITE_OTHER): Payer: Medicaid Other | Admitting: Family Medicine

## 2016-03-21 ENCOUNTER — Encounter: Payer: Self-pay | Admitting: Family Medicine

## 2016-03-21 DIAGNOSIS — D571 Sickle-cell disease 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: Secondary | ICD-10-CM

## 2016-03-21 DIAGNOSIS — D5700 Hb-SS disease with crisis, unspecified: Secondary | ICD-10-CM | POA: Diagnosis not present

## 2016-03-21 MED ORDER — FOLIC ACID 1 MG PO TABS
1.0000 mg | ORAL_TABLET | Freq: Every day | ORAL | 11 refills | Status: DC
Start: 2016-03-21 — End: 2017-01-15

## 2016-03-21 MED ORDER — OXYCODONE HCL 10 MG PO TABS: 10.0000 mg | ORAL_TABLET | ORAL | 0 refills | Status: DC | PRN

## 2016-03-21 MED FILL — IBUPROFEN 600 MG TABLET: 600 | 20 days supply | Qty: 60 | Fill #2

## 2016-03-21 MED FILL — oxyCODONE HCL 10 MG TABS: 10 | 15 days supply | Qty: 90 | Fill #0

## 2016-03-21 NOTE — Patient Instructions (Signed)
Continue current medications. follw-up in one month. Will need to do labs at that time.

## 2016-03-21 NOTE — Progress Notes (Signed)
[name], is a 45 y.o. female  [id number]  [id number]  DOB - 1970-05-25  CC:  Chief Complaint  Patient presents with  . Follow-up    SCD  . Medication Refill    OXYCODONE        HPI: [name] is a 45 y.o. female here sickle cell follow-up. She reports no significant change in her status since last vist. Her major problem is leg ulcers. Since being seen here last she was referred to dermatology by the [facility]. She is currently taking Doxycycline 100 Bid for 10 days and is using hydroxyzine for itching. She has a follow-up appt with dermatology on November 15th.   No Known Allergies Past Medical History:  Diagnosis Date  . Anemia 03/30/2012   Hx of sickle cell disease  . Anemia complicating pregnancy [id number]  . CAP (community acquired pneumonia)    2014  . Cholelithiasis y-1  . Leg ulcer ([place]) 10/27/2012   Chronic under care of wound clinic  . Sickle cell disease ([place])    Current Outpatient Prescriptions on File Prior to Visit  Medication Sig Dispense Refill  . Cholecalciferol (VITAMIN D) 2000 UNITS CAPS Take 1 capsule (2,000 Units total) by mouth daily. 30 capsule 11  . ibuprofen (ADVIL,MOTRIN) 600 MG tablet TAKE 1 TABLET BY MOUTH EVERY 8 HOURS AS NEEDED FOR MILD PAIN. 60 tablet 3  . ketoconazole (NIZORAL) 2 % cream Apply 1 application topically daily. 60 g 2   No current facility-administered medications on file prior to visit.    Family History  Problem Relation Age of Onset  . Sickle cell anemia Sister   . Diabetes Mother    Social History   Social History  . Marital status: Married    Spouse name: [name]  . Number of children: 1  . Years of education: N/A   Occupational History  . Employed in home care.    Social History Main Topics  . Smoking status: Never Smoker  . Smokeless tobacco: Never Used  . Alcohol use No  . Drug use: No  . Sexual activity: Not Currently    Birth control/ protection: None   Other  Topics Concern  . Not on file   Social History Narrative   Lives with husband.    Review of Systems: Constitutional: Negative for fever, chills, weight loss, appetite loss HENT: Denies Problems Eyes: Denies problems Neck: Denies problems Respiratory: Negative for cough, shortness of breath,   Cardiovascular: Negative for chest pain, palpitations and leg swelling. Positive for systolic murmur Gastrointestinal: Negative for abdominal pain, nausea,vomitng, diarrhea, constipation. Genitourinary: Denies problems Musculoskeletal: Denies problems Neurological: Denies problems Hematological: Denies problems Psychiatric/Behavioral: Denies depression, anxiety. SKin: Wound both feet followed by [place] and dermatology.   Objective:   Vitals:   03/21/16 1021  BP: 124/61  Pulse: 83  Resp: 16  Temp: 98.2 F (36.8 C)    Physical Exam: Constitutional: Patient appears well-developed and well-nourished. No distress. HENT: Normocephalic, atraumatic, External right and left ear normal. Oropharynx is clear and moist.  Eyes: Conjunctivae and EOM are normal. PERRLA, no scleral icterus. Neck: Normal ROM. Neck supple. No lymphadenopathy, No thyromegaly. CVS: RRR, S1/S2 +, no murmurs, no gallops, no rubs Pulmonary: Effort and breath sounds normal, no stridor, rhonchi, wheezes, rales.  Abdominal: Soft. Normoactive BS,, no distension, tenderness, rebound or guarding.  Musculoskeletal: Normal range of motion. No edema and no tenderness.  Neuro: Alert.Normal muscle tone coordination. Non-focal Skin: Skin is warm and dry. No  rash noted. Not diaphoretic. No erythema. No pallor.Wounds dressed, not inspected. Psychiatric: Normal mood and affect. Behavior, judgment, thought content normal.  Lab Results  Component Value Date   WBC 14.9 (H) 01/16/2016   HGB 6.3 (L) 01/16/2016   HCT 17.8 (L) 01/16/2016   MCV 82.8 01/16/2016   PLT 561 (H) 01/16/2016   Lab Results  Component Value Date    CREATININE 0.30 (L) 11/06/2015   BUN 6 (L) 11/06/2015   NA 137 11/06/2015   K 4.5 11/06/2015   CL 104 11/06/2015   CO2 25 11/06/2015    No results found for: HGBA1C Lipid Panel     Component Value Date/Time   CHOL 143 11/25/2014 1521   TRIG 137 11/25/2014 1521   HDL 27 (L) 11/25/2014 1521   CHOLHDL 5.3 11/25/2014 1521   VLDL 27 11/25/2014 1521   LDLCALC 89 11/25/2014 1521       Assessment and plan:   1. Sickle cell anemia with pain (HCC)  - Oxycodone HCl 10 MG TABS; Take 1 tablet (10 mg total) by mouth every 4 (four) hours as needed.  Dispense: 90 tablet; Refill: 0  2. Hb-SS disease 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 ([place])  - folic acid (FOLVITE) 1 MG tablet; Take 1 tablet (1 mg total) by mouth daily.  Dispense: 30 tablet; Refill: 11   -Sickle Cell Disease  Patient counseled  on use of opoid pain medications, including need to only get from [place] and our ability to follow use on [place]  CSRS and need to keep in a safe locked place away from children and pets. Have reviewed our refill policy related to erly refills if lost or stolen. Have reviewed possible side effects of opoids, Hydrea and need to take other SCD related medications as ordered.  Have review health maintenance needs, including immunizations, urine for proteim, dilated eye exam.  Have review the importance of smoking cessation if currently smoking.   The patient was given clear instructions to go to ER or return to medical center if symptoms don't improve, worsen or new problems develop. The patient verbalized understanding. The patient was told to call to get lab results if they haven't heard anything in the next week.     Return in about 1 month (around 04/20/2016).       [name], MSN, FNP-BC   03/21/2016, 10:42 AM

## 2016-04-02 ENCOUNTER — Telehealth (HOSPITAL_COMMUNITY): Payer: Self-pay | Admitting: Hematology

## 2016-04-02 ENCOUNTER — Encounter (HOSPITAL_COMMUNITY): Payer: Self-pay | Admitting: *Deleted

## 2016-04-02 ENCOUNTER — Non-Acute Institutional Stay (HOSPITAL_COMMUNITY)
Admission: AD | Admit: 2016-04-02 | Discharge: 2016-04-02 | Disposition: A | Discharge status: Home / Self Care | Payer: Medicaid Other | Source: Ambulatory Visit | Attending: Internal Medicine | Admitting: Internal Medicine

## 2016-04-02 DIAGNOSIS — D5700 Hb-SS disease with crisis, unspecified: Secondary | ICD-10-CM | POA: Diagnosis present

## 2016-04-02 LAB — CBC WITH DIFFERENTIAL/PLATELET
BASOS PCT: 2 %
Band Neutrophils: 0 %
Basophils Absolute: 0.2 10*3/uL — ABNORMAL HIGH (ref 0.0–0.1)
Blasts: 0 %
EOS PCT: 3 %
Eosinophils Absolute: 0.3 10*3/uL (ref 0.0–0.7)
HEMATOCRIT: 15.8 % — AB (ref 36.0–46.0)
HEMOGLOBIN: 5.8 g/dL — AB (ref 12.0–15.0)
LYMPHS PCT: 27 %
Lymphs Abs: 2.9 10*3/uL (ref 0.7–4.0)
MCH: 29.4 pg (ref 26.0–34.0)
MCHC: 36.7 g/dL — AB (ref 30.0–36.0)
MCV: 80.2 fL (ref 78.0–100.0)
MONO ABS: 1.2 10*3/uL — AB (ref 0.1–1.0)
MONOS PCT: 11 %
Metamyelocytes Relative: 0 %
Myelocytes: 0 %
NEUTROS ABS: 6.1 10*3/uL (ref 1.7–7.7)
NEUTROS PCT: 57 %
NRBC: 6 /100{WBCs} — AB
OTHER: 0 %
PROMYELOCYTES ABS: 0 %
Platelets: 415 10*3/uL — ABNORMAL HIGH (ref 150–400)
RBC: 1.97 MIL/uL — ABNORMAL LOW (ref 3.87–5.11)
RDW: 25 % — AB (ref 11.5–15.5)
WBC: 10.7 10*3/uL — ABNORMAL HIGH (ref 4.0–10.5)

## 2016-04-02 LAB — RETICULOCYTES: RBC.: 1.97 MIL/uL — ABNORMAL LOW (ref 3.87–5.11)

## 2016-04-02 LAB — URINALYSIS, ROUTINE W REFLEX MICROSCOPIC
Bilirubin Urine: NEGATIVE
Glucose, UA: NEGATIVE mg/dL
Hgb urine dipstick: NEGATIVE
Ketones, ur: NEGATIVE mg/dL
LEUKOCYTES UA: NEGATIVE
NITRITE: NEGATIVE
PH: 6.5 (ref 5.0–8.0)
Protein, ur: NEGATIVE mg/dL
SPECIFIC GRAVITY, URINE: 1.011 (ref 1.005–1.030)

## 2016-04-02 LAB — LACTATE DEHYDROGENASE: LDH: 318 U/L — ABNORMAL HIGH (ref 98–192)

## 2016-04-02 MED ORDER — SODIUM CHLORIDE 0.9% FLUSH: 9.0000 mL | INTRAVENOUS | Status: DC | PRN

## 2016-04-02 MED ORDER — SENNOSIDES-DOCUSATE SODIUM 8.6-50 MG PO TABS
1.0000 | ORAL_TABLET | Freq: Two times a day (BID) | ORAL | Status: DC
  Administered 2016-04-02: 1 via ORAL
  Filled 2016-04-02: qty 1

## 2016-04-02 MED ORDER — SODIUM CHLORIDE 0.9 % IV SOLN: Freq: Once | INTRAVENOUS | Status: DC

## 2016-04-02 MED ORDER — KETOROLAC TROMETHAMINE 30 MG/ML IJ SOLN
30.0000 mg | Freq: Four times a day (QID) | INTRAMUSCULAR | Status: DC
  Administered 2016-04-02: 30 mg via INTRAVENOUS
  Filled 2016-04-02: qty 1

## 2016-04-02 MED ORDER — HYDROMORPHONE 1 MG/ML IV SOLN
INTRAVENOUS | Status: DC
  Administered 2016-04-02: 9 mg via INTRAVENOUS
  Administered 2016-04-02: 12:00:00 via INTRAVENOUS
  Filled 2016-04-02: qty 25

## 2016-04-02 MED ORDER — DIPHENHYDRAMINE HCL 25 MG PO CAPS: 25.0000 mg | ORAL_CAPSULE | ORAL | Status: DC | PRN

## 2016-04-02 MED ORDER — POLYETHYLENE GLYCOL 3350 17 G PO PACK: 17.0000 g | PACK | Freq: Every day | ORAL | Status: DC | PRN

## 2016-04-02 MED ORDER — SODIUM CHLORIDE 0.9 % IV SOLN
25.0000 mg | INTRAVENOUS | Status: DC | PRN
  Filled 2016-04-02: qty 0.5

## 2016-04-02 MED ORDER — DEXTROSE-NACL 5-0.45 % IV SOLN
INTRAVENOUS | Status: DC
  Administered 2016-04-02: 12:00:00 via INTRAVENOUS

## 2016-04-02 MED ORDER — ONDANSETRON HCL 4 MG/2ML IJ SOLN: 4.0000 mg | Freq: Four times a day (QID) | INTRAMUSCULAR | Status: DC | PRN

## 2016-04-02 MED ORDER — NALOXONE HCL 0.4 MG/ML IJ SOLN: 0.4000 mg | INTRAMUSCULAR | Status: DC | PRN

## 2016-04-02 MED FILL — MUPIROCIN 2% OINTMENT: 2 | 15 days supply | Qty: 66 | Fill #1

## 2016-04-02 MED FILL — CLOBETASOL 0.05% OINTMENT: 0.05 | 21 days supply | Qty: 60 | Fill #1

## 2016-04-02 NOTE — Discharge Instructions (Signed)
Sickle Cell Anemia, Adult  For blood transfusion tomorrow   Sickle cell anemia is a condition where your red blood cells are shaped like sickles. Red blood cells carry oxygen through the body. Sickle-shaped red blood cells do not live as long as normal red blood cells. They also clump together and block blood from flowing through the blood vessels. These things prevent the body from getting enough oxygen. Sickle cell anemia causes organ damage and pain. It also increases the risk of infection. Follow these instructions at home:  Drink enough fluid to keep your pee (urine) clear or pale yellow. Drink more in hot weather and during exercise.  Do not smoke. Smoking lowers oxygen levels in the blood.  Only take over-the-counter or prescription medicines as told by your doctor.  Take antibiotic medicines as told by your doctor. Make sure you finish them even if you start to feel better.  Take supplements as told by your doctor.  Consider wearing a medical alert bracelet. This tells anyone caring for you in an emergency of your condition.  When traveling, keep your medical information, doctors' names, and the medicines you take with you at all times.  If you have a fever, do not take fever medicines right away. This could cover up a problem. Tell your doctor.  Keep all follow-up visits with your doctor. Sickle cell anemia requires regular medical care. Contact a doctor if: You have a fever. Get help right away if:  You feel dizzy or faint.  You have new belly (abdominal) pain, especially on the left side near the stomach area.  You have a lasting, often uncomfortable and painful erection of the penis (priapism). If it is not treated right away, you will become unable to have sex (impotence).  You have numbness in your arms or legs or you have a hard time moving them.  You have a hard time talking.  You have a fever or lasting symptoms for more than 2-3 days.  You have a fever and  your symptoms suddenly get worse.  You have signs or symptoms of infection. These include:  Chills.  Being more tired than normal (lethargy).  Irritability.  Poor eating.  Throwing up (vomiting).  You have pain that is not helped with medicine.  You have shortness of breath.  You have pain in your chest.  You are coughing up pus-like or bloody mucus.  You have a stiff neck.  Your feet or hands swell or have pain.  Your belly looks bloated.  Your joints hurt. This information is not intended to replace advice given to you by your health care provider. Make sure you discuss any questions you have with your health care provider. Document Released: 02/24/2013 Document Revised: 10/12/2015 Document Reviewed: 12/16/2012 Elsevier Interactive Patient Education  2017 [employer].

## 2016-04-02 NOTE — H&P (Signed)
[facility] History and Physical  Merie Kloeckner [id number] DOB: 1971-05-19 DOA: 04/02/2016  PCP: [name], FNP   Chief Complaint: Pain bilateral hands and back  HPI: [name] is a 45 y.o. female with history of sickle cell disease, infrequent crisis, came to the day hospital today because of severe pain in her hands and lower back. She rates the pain at 10/10, throbbing, constant, not relieved by home pain medications. She denies any chest pain or SOB, no fever, no abdominal pain, no N/V/D. No urinary symptom. She has a non-healing wound in her RLE, currently being followed by wound center and Dermatologist, on antibiotics and Theraskin applications. Current wound present for > 10 months.   Systemic Review: General: The patient denies anorexia, fever, weight loss Cardiac: Denies chest pain, syncope, palpitations, pedal edema  Respiratory: Denies cough, shortness of breath, wheezing GI: Denies severe indigestion/heartburn, abdominal pain, nausea, vomiting, diarrhea and constipation GU: Denies hematuria, incontinence, dysuria  Musculoskeletal: Denies arthritis  Skin: Denies suspicious skin lesions Neurologic: Denies focal weakness or numbness, change in vision  Past Medical History:  Diagnosis Date  . Anemia 03/30/2012   Hx of sickle cell disease  . Anemia complicating pregnancy [id number]  . CAP (community acquired pneumonia)    2014  . Cholelithiasis y-1  . Leg ulcer ([place]) 10/27/2012   Chronic under care of wound clinic  . Sickle cell disease ([place])    Past Surgical History:  Procedure Laterality Date  . ALLOGRAFT APPLICATION Right [id number]   Procedure: SURGICAL PREP FOR GRAFTING RIGHT LOWER EXTREMITY AND APPLICATION OF Jannifer Hick;  Surgeon: Irene Limbo, MD;  Location: [place];  Service: Plastics;  Laterality: Right;  . APPLICATION OF A-CELL OF EXTREMITY Right 10/09/2015   Procedure: APPLICATION OF Jannifer Hick;  Surgeon: Irene Limbo, MD;  Location: [place];  Service: Plastics;  Laterality: Right;  . CESAREAN SECTION    . CESAREAN SECTION N/A 01/06/2014   Procedure: CESAREAN SECTION;  Surgeon: Emily Filbert, MD;  Location: [facility];  Service: Obstetrics;  Laterality: N/A;  . I&D EXTREMITY Right 10/09/2015   Procedure: SURGICAL PREPARATION FOR GRAFTING RIGHT ANKLE AND APPLICATION THERASKIN;  Surgeon: Irene Limbo, MD;  Location: [facility];  Service: Plastics;  Laterality: Right;   No Known Allergies Family History  Problem Relation Age of Onset  . Sickle cell anemia Sister   . Diabetes Mother     Prior to Admission medications   Medication Sig Start Date End Date Taking? Authorizing Provider  Cholecalciferol (VITAMIN D) 2000 UNITS CAPS Take 1 capsule (2,000 Units total) by mouth daily. 03/05/15  Yes [name], FNP  doxycycline (VIBRAMYCIN) 100 MG capsule Take 100 mg by mouth 2 (two) times daily.   Yes Historical Provider, MD  folic acid (FOLVITE) 1 MG tablet Take 1 tablet (1 mg total) by mouth daily. 03/21/16  Yes [name], NP  hydrOXYzine (VISTARIL) 25 MG capsule Take 25 mg by mouth 3 (three) times daily as needed.   Yes Historical Provider, MD  ibuprofen (ADVIL,MOTRIN) 600 MG tablet TAKE 1 TABLET BY MOUTH EVERY 8 HOURS AS NEEDED FOR MILD PAIN. 01/16/16  Yes [name], NP  ketoconazole (NIZORAL) 2 % cream Apply 1 application topically daily. 12/14/15  Yes [name], NP  Oxycodone HCl 10 MG TABS Take 1 tablet (10 mg total) by mouth every 4 (four) hours as needed. 03/21/16  Yes [name], NP   Physical Exam: Vitals:  04/02/16 1038  BP: 127/80  Pulse: 73  Resp: 20  Temp: 98.1 F (36.7 C)  SpO2: 93%  Weight: 118 lb (53.5 kg)  Height: 5\' 7"  (1.702 m)   General: Alert, awake, afebrile, anicteric, not in obvious distress HEENT: Normocephalic and Atraumatic, Mucous membranes pink                PERRLA; EOM intact; No scleral icterus,                  Nares: Patent, Oropharynx: Clear, Fair Dentition                 Neck: FROM, no cervical lymphadenopathy, thyromegaly, carotid bruit or JVD;  CHEST WALL: No tenderness  CHEST: Normal respiration, clear to auscultation bilaterally  HEART: Regular rate and rhythm; no murmurs rubs or gallops  BACK: No kyphosis or scoliosis; no CVA tenderness  ABDOMEN: Positive Bowel Sounds, soft, non-tender; no masses, no organomegaly EXTREMITIES: No cyanosis, clubbing, or edema SKIN:  Wound dressings clean and dry CNS: Alert and Oriented x 4, Nonfocal exam, CN 2-12 intact  Labs on Admission:  Basic Metabolic Panel: No results for input(s): NA, K, CL, CO2, GLUCOSE, BUN, CREATININE, CALCIUM, MG, PHOS in the last 168 hours. Liver Function Tests: No results for input(s): AST, ALT, ALKPHOS, BILITOT, PROT, ALBUMIN in the last 168 hours. No results for input(s): LIPASE, AMYLASE in the last 168 hours. No results for input(s): AMMONIA in the last 168 hours. CBC: No results for input(s): WBC, NEUTROABS, HGB, HCT, MCV, PLT in the last 168 hours. Cardiac Enzymes: No results for input(s): CKTOTAL, CKMB, CKMBINDEX, TROPONINI in the last 168 hours.  BNP (last 3 results) No results for input(s): BNP in the last 8760 hours.  ProBNP (last 3 results) No results for input(s): PROBNP in the last 8760 hours.  CBG: No results for input(s): GLUCAP in the last 168 hours.  Assessment/Plan Active Problems:   Sickle cell anemia with crisis ([place])   Admits to the Day Hospital  IVF D5 .45% Saline @ 125 mls/hour  Weight based Dilaudid PCA started within 30 minutes of admission  IV Toradol 30 mg Q 6 H  Monitor vitals very closely, Re-evaluate pain scale every hour  2 L of Oxygen by [place]  Patient will be re-evaluated for pain in the context of function and relationship to baseline as care progresses.  If no significant relieve from pain (remains above 5/10) will transfer patient to inpatient services for  further evaluation and management  Code Status: Full  Family Communication: None  DVT Prophylaxis: Ambulate as tolerated   Time spent: 63 Minutes  Shareef Eddinger, MD, MHA, FACP, FAAP, CPE  If 7PM-7AM, please contact night-coverage [email or web address] 04/02/2016, 11:21 AM

## 2016-04-02 NOTE — Telephone Encounter (Signed)
Patient C/O pain to hands bilateral and back.  Patient rates pain 10/10.  Patient has taken oxycodone without improvement.  Denies chest pain or shortness of breath, no abdominal pain, no N/V/D.  I advised I would speak with the provider and give her a call back.

## 2016-04-02 NOTE — Discharge Summary (Signed)
Physician Discharge Summary  [name] [id number] DOB: [birth year] DOA: 04/02/2016  PCP: [name], FNP  Admit date: 04/02/2016  Discharge date: 04/02/2016  Time spent: 30 minutes  Discharge Diagnoses:  Active Problems:   Sickle cell anemia with crisis [facility])  Discharge Condition: Stable  Diet recommendation: Regular  Filed Weights   04/02/16 1038  Weight: 118 lb (53.5 kg)   History of present illness:  [name] is a 45 y.o. female with history of sickle cell disease, infrequent crisis, came to the day hospital today because of severe pain in her hands and lower back. She rates the pain at 10/10, throbbing, constant, not relived by home pain medications. She denies any chest pain or SOB, no fever, no abdominal pain, no N/V/D. No urinary symptom. She has a non-healing wound in her RLE, currently being followed by wound center and Dermatologist, on antibiotics and Theraskin applications.  Current wound present for > 10 months.  Hospital Course:  [name] was admitted to the day hospital with sickle cell painful crisis. Patient was treated with weight based IV Dilaudid PCA, IV Toradol as well as IV fluids. Hemoglobin was found to be 5.8, she was typed and cross matched and scheduled for blood transfusion tomorrow morning.  [name] showed marginal improvement symptomatically, pain only improved from 10 to 7/10 at the time of discharge. Patient was discharged home in a hemodynamically stable condition. She was instructed to come back in the morning for further pain management and blood transfusion.   Discharge Instructions We discussed the need for good hydration, monitoring of hydration status, avoidance of heat, cold, stress, and infection triggers. We discussed the need to be compliant with taking Hydrea. [name] was reminded of the need to seek medical attention of any symptoms of bleeding, anemia, or infection occurs. Hb was low at 5.8, baseline of 7, patient has  been typed and cross-matched, scheduled for transfusion of one unit of PRBC.  Discharge Exam: Vitals:   04/02/16 1500 04/02/16 1622  BP: (!) 111/59 130/66  Pulse: 60 63  Resp: 14 15  Temp:     General appearance: alert, cooperative and no distress, icterus+, pale+ Eyes: conjunctivae/corneas clear. PERRL, EOM's intact. Fundi benign. Neck: no adenopathy, no carotid bruit, no JVD, supple, symmetrical, trachea midline and thyroid not enlarged, symmetric, no tenderness/mass/nodules Back: symmetric, no curvature. ROM normal. No CVA tenderness. Resp: clear to auscultation bilaterally Chest wall: no tenderness Cardio: regular rate and rhythm, S1, S2 normal, no murmur, click, rub or gallop GI: soft, non-tender; bowel sounds normal; no masses, no organomegaly Extremities: extremities normal, atraumatic, no cyanosis or edema Pulses: 2+ and symmetric Neurologic: Grossly normal  Current Discharge Medication List    CONTINUE these medications which have NOT CHANGED   Details  Cholecalciferol (VITAMIN D) 2000 UNITS CAPS Take 1 capsule (2,000 Units total) by mouth daily. Qty: 30 capsule, Refills: 11   Associated Diagnoses: Vitamin D deficiency    doxycycline (VIBRAMYCIN) 100 MG capsule Take 100 mg by mouth 2 (two) times daily.    folic acid (FOLVITE) 1 MG tablet Take 1 tablet (1 mg total) by mouth daily. Qty: 30 tablet, Refills: 11   Associated Diagnoses: Hb-SS disease 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 (HCC)    hydrOXYzine (VISTARIL) 25 MG capsule Take 25 mg by mouth 3 (three) times daily as needed.    ibuprofen (ADVIL,MOTRIN) 600 MG tablet TAKE 1 TABLET BY MOUTH EVERY 8 HOURS AS NEEDED FOR MILD PAIN. Qty: 60 tablet, Refills: 3    ketoconazole (NIZORAL) 2 %  cream Apply 1 application topically daily. Qty: 60 g, Refills: 2    Oxycodone HCl 10 MG TABS Take 1 tablet (10 mg total) by mouth every 4 (four) hours as needed. Qty: 90 tablet, Refills: 0   Associated Diagnoses: Sickle cell anemia with pain (HCC)        No Known Allergies  Significant Diagnostic Studies: No results found.  Signed:  Angelica Chessman MD, [place], [place], [place], CPE   04/02/2016, 4:25 PM

## 2016-04-02 NOTE — Progress Notes (Signed)
CRITICAL VALUE ALERT  Critical value received: Hgb 5.8  Date of notification:  04/02/2016  Time of notification: 12:17  Critical value read back: yes  Nurse who received alert:  Roberto Scales, RN  Dr. Doreene Burke notified verbally.

## 2016-04-02 NOTE — Progress Notes (Signed)
Patient ID: [name], female   DOB: [birth year], 45 y.o.   MRN: [id number] Admitted to [facility] for complaints of pain in left arm and back and joints with a pain intensity of 10/10. Treated with IV fluids, IV Toradol and PCA Dilaudid. Pain level down to 5/10 at time of discharge. Went over discharge instructions with patient and copy given. She understands to be back here at 8 am tomorrow for 1 unit PRBC due to hgb of 5.8. She wants IV to stay in and order obtained for IV to stay in. Educated patient on making sure the connectors stay on and what to do if IV comes out. IV flushed, caps secured and line secured and she knows not to get it wet. Alert, oriented and ambulatory at time of discharge.

## 2016-04-02 NOTE — Telephone Encounter (Signed)
Left message for patient that it is ok for her to come to [facility]. Asked patient to call back to confirm she received the message.

## 2016-04-03 ENCOUNTER — Ambulatory Visit (HOSPITAL_COMMUNITY): Admit: 2016-04-03 | Payer: Medicaid Other

## 2016-04-03 ENCOUNTER — Non-Acute Institutional Stay (HOSPITAL_COMMUNITY)
Admission: AD | Admit: 2016-04-03 | Discharge: 2016-04-03 | Disposition: A | Discharge status: Home / Self Care | Payer: Medicaid Other | Source: Ambulatory Visit | Attending: Internal Medicine | Admitting: Internal Medicine

## 2016-04-03 ENCOUNTER — Encounter (HOSPITAL_COMMUNITY): Payer: Self-pay | Admitting: *Deleted

## 2016-04-03 ENCOUNTER — Encounter (HOSPITAL_COMMUNITY): Payer: Self-pay

## 2016-04-03 DIAGNOSIS — M545 Low back pain: Secondary | ICD-10-CM | POA: Diagnosis not present

## 2016-04-03 DIAGNOSIS — D5700 Hb-SS disease with crisis, unspecified: Secondary | ICD-10-CM | POA: Diagnosis not present

## 2016-04-03 LAB — PREPARE RBC (CROSSMATCH)

## 2016-04-03 MED ORDER — SODIUM CHLORIDE 0.9 % IV SOLN
Freq: Once | INTRAVENOUS | Status: AC
  Administered 2016-04-03: 09:00:00 via INTRAVENOUS

## 2016-04-03 MED ORDER — NALOXONE HCL 0.4 MG/ML IJ SOLN: 0.4000 mg | INTRAMUSCULAR | Status: DC | PRN

## 2016-04-03 MED ORDER — SODIUM CHLORIDE 0.9% FLUSH: 9.0000 mL | INTRAVENOUS | Status: DC | PRN

## 2016-04-03 MED ORDER — SODIUM CHLORIDE 0.9 % IV SOLN
25.0000 mg | INTRAVENOUS | Status: DC | PRN
  Filled 2016-04-03: qty 0.5

## 2016-04-03 MED ORDER — ONDANSETRON HCL 4 MG/2ML IJ SOLN: 4.0000 mg | Freq: Four times a day (QID) | INTRAMUSCULAR | Status: DC | PRN

## 2016-04-03 MED ORDER — HYDROMORPHONE 1 MG/ML IV SOLN
INTRAVENOUS | Status: DC
  Administered 2016-04-03: 09:00:00 via INTRAVENOUS
  Administered 2016-04-03: 1 mg via INTRAVENOUS
  Administered 2016-04-03: 0 mg via INTRAVENOUS
  Administered 2016-04-03: 11.5 mg via INTRAVENOUS
  Filled 2016-04-03: qty 25

## 2016-04-03 MED ORDER — DIPHENHYDRAMINE HCL 25 MG PO CAPS: 25.0000 mg | ORAL_CAPSULE | ORAL | Status: DC | PRN

## 2016-04-03 MED ORDER — POLYETHYLENE GLYCOL 3350 17 G PO PACK: 17.0000 g | PACK | Freq: Every day | ORAL | Status: DC | PRN

## 2016-04-03 MED ORDER — KETOROLAC TROMETHAMINE 30 MG/ML IJ SOLN
30.0000 mg | Freq: Four times a day (QID) | INTRAMUSCULAR | Status: DC
  Administered 2016-04-03: 30 mg via INTRAVENOUS
  Filled 2016-04-03: qty 1

## 2016-04-03 MED ORDER — SENNOSIDES-DOCUSATE SODIUM 8.6-50 MG PO TABS
1.0000 | ORAL_TABLET | Freq: Two times a day (BID) | ORAL | Status: DC
  Administered 2016-04-03: 1 via ORAL
  Filled 2016-04-03: qty 1

## 2016-04-03 NOTE — Progress Notes (Signed)
Patient ID: [name], female   DOB: 09-05-1970, 44 y.o.   MRN: [id number] Admitted to [facility] for blood transfusion 1 unit PRBC via PIV and for pain control for sickle cell pain 8/10 intensity in left forearm. Ace wrap on left forearm. No swelling noted. Gait steady. Treated with IV Toradol, Dilaudid PCA. Pain level down to 4/10 at time of discharge.Turned off Dilaudid PCA 1 hour prior to discharge. Went over discharge instructions with patient and copy given to patient. Alert, oriented and ambulatory at time of discharge.

## 2016-04-03 NOTE — H&P (Signed)
[facility] History and Physical  [name] [id number] DOB: 10/03/1970 DOA: 04/03/2016  PCP: [name], FNP   Chief Complaint: Pain and low Hb  HPI: [name] is a 45 y.o. female with history of sickle cell disease, she was admitted for observation and pain management yesterday, found to have low Hb at 5.8, she was typed and cross-matched and scheduled for transfusion of PRBC this morning. She has no new complaint except for ongoing pain in her hands and lower back. She denies any fever, no chest pain, no SOB, no N/V/D, no urinary symptom.   Systemic Review: General: The patient denies anorexia, fever, weight loss Cardiac: Denies chest pain, syncope, palpitations, pedal edema  Respiratory: Denies cough, shortness of breath, wheezing GI: Denies severe indigestion/heartburn, abdominal pain, nausea, vomiting, diarrhea and constipation GU: Denies hematuria, incontinence, dysuria  Musculoskeletal: Denies arthritis  Skin: Ulcers on RLE s/p [place] application, [facility] Neurologic: Denies focal weakness or numbness, change in vision  Past Medical History:  Diagnosis Date  . Anemia 03/30/2012   Hx of sickle cell disease  . Anemia complicating pregnancy [id number]  . CAP (community acquired pneumonia)    2014  . Cholelithiasis y-1  . Leg ulcer ([place]) 10/27/2012   Chronic under care of wound clinic  . Sickle cell disease ([place])    Past Surgical History:  Procedure Laterality Date  . ALLOGRAFT APPLICATION Right [id number]   Procedure: SURGICAL PREP FOR GRAFTING RIGHT LOWER EXTREMITY AND APPLICATION OF Jannifer Hick;  Surgeon: Irene Limbo, MD;  Location: [place];  Service: Plastics;  Laterality: Right;  . APPLICATION OF A-CELL OF EXTREMITY Right 10/09/2015   Procedure: APPLICATION OF Jannifer Hick;  Surgeon: Irene Limbo, MD;  Location: [place];  Service: Plastics;  Laterality: Right;  .  CESAREAN SECTION    . CESAREAN SECTION N/A 01/06/2014   Procedure: CESAREAN SECTION;  Surgeon: Emily Filbert, MD;  Location: [facility];  Service: Obstetrics;  Laterality: N/A;  . I&D EXTREMITY Right 10/09/2015   Procedure: SURGICAL PREPARATION FOR GRAFTING RIGHT ANKLE AND APPLICATION THERASKIN;  Surgeon: Irene Limbo, MD;  Location: [place];  Service: Plastics;  Laterality: Right;   No Known Allergies  Family History  Problem Relation Age of Onset  . Sickle cell anemia Sister   . Diabetes Mother     Prior to Admission medications   Medication Sig Start Date End Date Taking? Authorizing Provider  Cholecalciferol (VITAMIN D) 2000 UNITS CAPS Take 1 capsule (2,000 Units total) by mouth daily. 03/05/15  Yes [name], FNP  doxycycline (VIBRAMYCIN) 100 MG capsule Take 100 mg by mouth 2 (two) times daily.   Yes Historical Provider, MD  folic acid (FOLVITE) 1 MG tablet Take 1 tablet (1 mg total) by mouth daily. 03/21/16  Yes [name], NP  hydrOXYzine (VISTARIL) 25 MG capsule Take 25 mg by mouth 3 (three) times daily as needed.   Yes Historical Provider, MD  ibuprofen (ADVIL,MOTRIN) 600 MG tablet TAKE 1 TABLET BY MOUTH EVERY 8 HOURS AS NEEDED FOR MILD PAIN. 01/16/16  Yes [name], NP  ketoconazole (NIZORAL) 2 % cream Apply 1 application topically daily. 12/14/15  Yes [name], NP  Oxycodone HCl 10 MG TABS Take 1 tablet (10 mg total) by mouth every 4 (four) hours as needed. 03/21/16  Yes [name], NP   Physical Exam: Vitals:   04/03/16 0827 04/03/16 0839  BP: (!) 112/57 Marland Kitchen)  112/57  Pulse: 78 78  Resp: 16 16  Temp: 98.1 F (36.7 C) 98.1 F (36.7 C)  TempSrc: Oral Oral  SpO2: 95% 95%  Weight: 118 lb (53.5 kg)   Height: 5\' 7"  (1.702 m)    General: Alert, awake, afebrile, anicteric, not in obvious distress HEENT: Normocephalic and Atraumatic, Mucous membranes pink                PERRLA; EOM intact; No scleral icterus,                  Nares: Patent, Oropharynx: Clear, Fair Dentition                 Neck: FROM, no cervical lymphadenopathy, thyromegaly, carotid bruit or JVD;  CHEST WALL: No tenderness  CHEST: Normal respiration, clear to auscultation bilaterally  HEART: Regular rate and rhythm; no murmurs rubs or gallops  BACK: No kyphosis or scoliosis; no CVA tenderness  ABDOMEN: Positive Bowel Sounds, soft, non-tender; no masses, no organomegaly EXTREMITIES: No cyanosis, clubbing, or edema: Wound dressings clean and dry CNS: Alert and Oriented x 4, Nonfocal exam, CN 2-12 intact  Labs on Admission:  Basic Metabolic Panel: No results for input(s): NA, K, CL, CO2, GLUCOSE, BUN, CREATININE, CALCIUM, MG, PHOS in the last 168 hours. Liver Function Tests: No results for input(s): AST, ALT, ALKPHOS, BILITOT, PROT, ALBUMIN in the last 168 hours. No results for input(s): LIPASE, AMYLASE in the last 168 hours. No results for input(s): AMMONIA in the last 168 hours. CBC:  Recent Labs Lab 04/02/16 1131  WBC 10.7*  NEUTROABS 6.1  HGB 5.8*  HCT 15.8*  MCV 80.2  PLT 415*   Cardiac Enzymes: No results for input(s): CKTOTAL, CKMB, CKMBINDEX, TROPONINI in the last 168 hours.  BNP (last 3 results) No results for input(s): BNP in the last 8760 hours.  ProBNP (last 3 results) No results for input(s): PROBNP in the last 8760 hours.  CBG: No results for input(s): GLUCAP in the last 168 hours.  Assessment/Plan Active Problems:   Sickle cell anemia with crisis ([place])   Admits to the Day Hospital  Transfuse with 1 unit of PRBC  Start Weight based Dilaudid PCA started within 30 minutes of admission  IV Toradol 30 mg Q 6 H  Monitor vitals very closely, Re-evaluate pain scale every hour  2 L of Oxygen by [place]  Patient will be re-evaluated for pain in the context of function and relationship to baseline as care progresses.  If no significant relieve from pain (remains above 5/10) will transfer patient to inpatient  services for further evaluation and management  Code Status: Full  Family Communication: None  DVT Prophylaxis: Ambulate as tolerated   Time spent: 55 Minutes  [name], MD, MHA, FACP, FAAP, CPE  If 7PM-7AM, please contact night-coverage [email or web address] 04/03/2016, 8:43 AM

## 2016-04-04 LAB — TYPE AND SCREEN
ABO/RH(D): B POS
ANTIBODY SCREEN: NEGATIVE
Unit division: 0

## 2016-04-05 NOTE — Discharge Summary (Signed)
Physician Discharge Summary  Charmian Ferran [id number] DOB: 05/15/1971 DOA: 04/03/2016  PCP: [name], FNP  Admit date: 04/03/2016  Discharge date: 04/03/2016  Time spent: 30 minutes  Discharge Diagnoses:  Active Problems:   Sickle cell anemia with crisis [facility])  Discharge Condition: Stable  Diet recommendation: Regular  Filed Weights   04/03/16 0827  Weight: 118 lb (53.5 kg)   History of present illness:  [name] is a 45 y.o. female with history of sickle cell disease, she was admitted for observation and pain management yesterday, found to have low Hb at 5.8, she was typed and cross-matched and scheduled for transfusion of PRBC this morning. She has no new complaint except for ongoing pain in her hands and lower back. She denies any fever, no chest pain, no SOB, no N/V/D, no urinary symptom.   Hospital Course:  Nekeya Dame was admitted to the day hospital with sickle cell painful crisis and low Hb. Patient was treated with weight based IV Dilaudid PCA, IV Toradol as well as IV fluids. She was transfused uneventfully with 1 unit of PRBC. Lisania showed significant improvement symptomatically, pain improved from 7 to 3/10 at the time of discharge. Patient was discharged home in a hemodynamically stable condition. Prarthana will follow-up at the clinic as previously scheduled, continue with home medications as per prior to admission.  Discharge Instructions We discussed the need for good hydration, monitoring of hydration status, avoidance of heat, cold, stress, and infection triggers. We discussed the need to be compliant with taking Hydrea. Mellonie was reminded of the need to seek medical attention of any symptoms of bleeding, anemia, or infection occurs. Continue to follow up with Dermatologist and [place] for non-healing ulcers.  Discharge Exam: Vitals:   04/03/16 1603 04/03/16 1629  BP:    Pulse:  74  Resp: 15 14  Temp:     General appearance: alert,  cooperative and no distress Eyes: conjunctivae/corneas clear. PERRL, EOM's intact. Fundi benign. Neck: no adenopathy, no carotid bruit, no JVD, supple, symmetrical, trachea midline and thyroid not enlarged, symmetric, no tenderness/mass/nodules Back: symmetric, no curvature. ROM normal. No CVA tenderness. Resp: clear to auscultation bilaterally Chest wall: no tenderness Cardio: regular rate and rhythm, S1, S2 normal, no murmur, click, rub or gallop GI: soft, non-tender; bowel sounds normal; no masses, no organomegaly Extremities: extremities normal, atraumatic, no cyanosis or edema Pulses: 2+ and symmetric Neurologic: Grossly normal  Discharge Medication List as of 04/03/2016  2:45 PM    CONTINUE these medications which have NOT CHANGED   Details  Cholecalciferol (VITAMIN D) 2000 UNITS CAPS Take 1 capsule (2,000 Units total) by mouth daily., Starting 03/05/2015, Until Discontinued, Normal    doxycycline (VIBRAMYCIN) 100 MG capsule Take 100 mg by mouth 2 (two) times daily., Historical Med    folic acid (FOLVITE) 1 MG tablet Take 1 tablet (1 mg total) by mouth daily., Starting Thu 03/21/2016, Normal    hydrOXYzine (VISTARIL) 25 MG capsule Take 25 mg by mouth 3 (three) times daily as needed., Historical Med    ibuprofen (ADVIL,MOTRIN) 600 MG tablet TAKE 1 TABLET BY MOUTH EVERY 8 HOURS AS NEEDED FOR MILD PAIN., Normal    ketoconazole (NIZORAL) 2 % cream Apply 1 application topically daily., Starting Thu 12/14/2015, Normal    Oxycodone HCl 10 MG TABS Take 1 tablet (10 mg total) by mouth every 4 (four) hours as needed., Starting Thu 03/21/2016, Print       No Known Allergies  Significant Diagnostic Studies: No results  found.  Signed:  Angelica Chessman MD, [place], [place], [place], CPE   04/05/2016, 4:17 PM

## 2016-04-10 MED FILL — DOXYCYCLINE HYCLATE 100 MG: 100 | 30 days supply | Qty: 60 | Fill #0

## 2016-04-10 MED FILL — IBUPROFEN 600 MG TABLET: 600 | 20 days supply | Qty: 60 | Fill #3

## 2016-04-15 MED FILL — levoFLOXacin 500 MG TABS: 500 | 10 days supply | Qty: 10 | Fill #0

## 2016-04-16 ENCOUNTER — Encounter: Payer: Self-pay | Admitting: Internal Medicine

## 2016-04-16 ENCOUNTER — Telehealth: Payer: Self-pay

## 2016-04-16 ENCOUNTER — Ambulatory Visit (INDEPENDENT_AMBULATORY_CARE_PROVIDER_SITE_OTHER): Payer: Medicaid Other | Admitting: Internal Medicine

## 2016-04-16 VITALS — BP 106/51 | HR 71 | Temp 98.1°F | Resp 18 | Ht 67.0 in | Wt 133.0 lb

## 2016-04-16 DIAGNOSIS — G894 Chronic pain syndrome: Secondary | ICD-10-CM | POA: Diagnosis not present

## 2016-04-16 DIAGNOSIS — L97319 Non-pressure chronic ulcer of right ankle with unspecified severity: Secondary | ICD-10-CM

## 2016-04-16 DIAGNOSIS — D571 Sickle-cell disease 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: Secondary | ICD-10-CM

## 2016-04-16 LAB — CBC WITH DIFFERENTIAL/PLATELET
BASOS PCT: 1 %
Basophils Absolute: 156 cells/uL (ref 0–200)
EOS ABS: 312 {cells}/uL (ref 15–500)
EOS PCT: 2 %
HCT: 18.2 % — ABNORMAL LOW (ref 35.0–45.0)
Hemoglobin: 6.2 g/dL — ABNORMAL LOW (ref 11.7–15.5)
Lymphocytes Relative: 18 %
Lymphs Abs: 2808 cells/uL (ref 850–3900)
MCH: 28.2 pg (ref 27.0–33.0)
MCHC: 34.1 g/dL (ref 32.0–36.0)
MCV: 82.7 fL (ref 80.0–100.0)
MONOS PCT: 13 %
MPV: 8.9 fL (ref 7.5–12.5)
Monocytes Absolute: 2028 cells/uL — ABNORMAL HIGH (ref 200–950)
NEUTROS ABS: 10296 {cells}/uL — AB (ref 1500–7800)
Neutrophils Relative %: 66 %
PLATELETS: 559 10*3/uL — AB (ref 140–400)
RBC: 2.2 MIL/uL — ABNORMAL LOW (ref 3.80–5.10)
RDW: 20.5 % — ABNORMAL HIGH (ref 11.0–15.0)
WBC: 15.6 10*3/uL — ABNORMAL HIGH (ref 3.8–10.8)

## 2016-04-16 MED ORDER — OXYCODONE HCL 10 MG PO TABS: 10.0000 mg | ORAL_TABLET | ORAL | 0 refills | Status: DC | PRN

## 2016-04-16 MED FILL — oxyCODONE HCL 10 MG TABS: 10 | 15 days supply | Qty: 90 | Fill #0

## 2016-04-16 MED FILL — MUPIROCIN 2% OINTMENT: 2 | 15 days supply | Qty: 66 | Fill #2

## 2016-04-16 NOTE — Progress Notes (Signed)
Patient is here for HFU  Patient complains of leg pain being present at a 10.  Patient has taken medication today. Patient has eaten today.

## 2016-04-16 NOTE — Progress Notes (Signed)
[name], is a 45 y.o. female  [id number]  [id number]  DOB - June 26, 1970  Chief Complaint  Patient presents with  . Follow-up      Subjective:   [name] is a 45 y.o. female with history of sickle cell disease admitted for observation, blood transfusion and pain management 2 weeks ago here today for a follow up visit. She has no new complaint today. She claims compliance with medications. She is not on Hydrea. She needs refill of her pain medications today. She follows up with wound care center for care of chronic nonhealing bilateral lower limb ulcers. Patient has No headache, No chest pain, No abdominal pain - No Nausea, No new weakness tingling or numbness, No Cough - SOB.  Problem  Non-Pressure Chronic Ulcer of Ankle, Right, With Unspecified Severity ([facility])    ALLERGIES: No Known Allergies  PAST MEDICAL HISTORY: Past Medical History:  Diagnosis Date  . Anemia 03/30/2012   Hx of sickle cell disease  . Anemia complicating pregnancy [id number]  . CAP (community acquired pneumonia)    2014  . Cholelithiasis y-1  . Leg ulcer ([place]) 10/27/2012   Chronic under care of wound clinic  . Sickle cell disease ([place])     MEDICATIONS AT HOME: Prior to Admission medications   Medication Sig Start Date End Date Taking? Authorizing Provider  Cholecalciferol (VITAMIN D) 2000 UNITS CAPS Take 1 capsule (2,000 Units total) by mouth daily. 03/05/15  Yes [name], FNP  folic acid (FOLVITE) 1 MG tablet Take 1 tablet (1 mg total) by mouth daily. 03/21/16  Yes [name], NP  hydrOXYzine (VISTARIL) 25 MG capsule Take 25 mg by mouth 3 (three) times daily as needed.   Yes Historical Provider, MD  ibuprofen (ADVIL,MOTRIN) 600 MG tablet TAKE 1 TABLET BY MOUTH EVERY 8 HOURS AS NEEDED FOR MILD PAIN. 01/16/16  Yes [name], NP  ketoconazole (NIZORAL) 2 % cream Apply 1 application topically daily. 12/14/15  Yes [name], NP  Oxycodone HCl 10 MG TABS Take 1  tablet (10 mg total) by mouth every 4 (four) hours as needed. 04/16/16  Yes Tresa Garter, MD    Objective:   Vitals:   04/16/16 1119  BP: (!) 106/51  Pulse: 71  Resp: 18  Temp: 98.1 F (36.7 C)  TempSrc: Oral  Weight: 133 lb (60.3 kg)  Height: 5\' 7"  (1.702 m)   Exam General appearance : Awake, alert, not in any distress. Speech Clear. Not toxic looking HEENT: Atraumatic and Normocephalic, pupils equally reactive to light and accomodation Neck: Supple, no JVD. No cervical lymphadenopathy.  Chest: Good air entry bilaterally, no added sounds  CVS: S1 S2 regular, no murmurs.  Abdomen: Bowel sounds present, Non tender and not distended with no gaurding, rigidity or rebound. Extremities: B/L Lower Ext shows wound dressing clean and dry, both legs are warm to touch Neurology: Awake alert, and oriented X 3, CN II-XII intact, Non focal  Data Review No results found for: HGBA1C  Assessment & Plan   1. Hb-SS disease 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 ([place])  - CBC with Differential - Oxycodone HCl 10 MG TABS; Take 1 tablet (10 mg total) by mouth every 4 (four) hours as needed.  Dispense: 90 tablet; Refill: 0  2. Chronic pain syndrome  - Oxycodone HCl 10 MG TABS; Take 1 tablet (10 mg total) by mouth every 4 (four) hours as needed.  Dispense: 90 tablet; Refill: 0  3. Non-pressure chronic ulcer of ankle, right, with  unspecified severity ([place])  - CBC with Differential - Follow up with wound clinic as scheduled on 12/10 - Continue follow up with Dermatologist  Patient have been counseled extensively about nutrition and exercise. Other issues discussed during this visit include: low cholesterol diet, weight control and daily exercise, foot care, annual eye examinations at Ophthalmology, importance of adherence with medications and regular follow-up.   Return in about 4 weeks (around 05/14/2016) for Sickle Cell Disease/Pain, Leg Ulcer.  The patient was given clear instructions to go to ER or return  to medical center if symptoms don't improve, worsen or new problems develop. The patient verbalized understanding. The patient was told to call to get lab results if they haven't heard anything in the next week.   This note has been created with Surveyor, quantity. Any transcriptional errors are unintentional.    Angelica Chessman, MD, [place], Karilyn Cota, [place] and [place], [place]   04/16/2016, 11:34 AM

## 2016-04-16 NOTE — Patient Instructions (Signed)
Sickle Cell Anemia, Adult °Sickle cell anemia is a condition in which red blood cells have an abnormal “sickle” shape. This abnormal shape shortens the cells’ life span, which results in a lower than normal concentration of red blood cells in the blood. The sickle shape also causes the cells to clump together and block free blood flow through the blood vessels. As a result, the tissues and organs of the body do not receive enough oxygen. Sickle cell anemia causes organ damage and pain and increases the risk of infection. °What are the causes? °Sickle cell anemia is a genetic disorder. Those who receive two copies of the gene have the condition, and those who receive one copy have the trait. °What increases the risk? °The sickle cell gene is most common in people whose families originated in [place]. Other areas of the globe where sickle cell trait occurs include the Mediterranean, South and [place], the [place], and the [place]. °What are the signs or symptoms? °· Pain, especially in the extremities, back, chest, or abdomen (common). The pain may start suddenly or may develop following an illness, especially if there is dehydration. Pain can also occur due to overexertion or exposure to extreme temperature changes. °· Frequent severe bacterial infections, especially certain types of pneumonia and meningitis. °· Pain and swelling in the hands and feet. °· Decreased activity. °· Loss of appetite. °· Change in behavior. °· Headaches. °· Seizures. °· Shortness of breath or difficulty breathing. °· Vision changes. °· Skin ulcers. °Those with the trait may not have symptoms or they may have mild symptoms. °How is this diagnosed? °Sickle cell anemia is diagnosed with blood tests that demonstrate the genetic trait. It is often diagnosed during the newborn period, due to mandatory testing nationwide. A variety of blood tests, X-rays, CT scans, MRI scans, ultrasounds, and lung function tests may also be done to  monitor the condition. °How is this treated? °Sickle cell anemia may be treated with: °· Medicines. You may be given pain medicines, antibiotic medicines (to treat and prevent infections) or medicines to increase the production of certain types of hemoglobin. °· Fluids. °· Oxygen. °· Blood transfusions. ° °Follow these instructions at home: °· Drink enough fluid to keep your urine clear or pale yellow. Increase your fluid intake in hot weather and during exercise. °· Do not smoke. Smoking lowers oxygen levels in the blood. °· Only take over-the-counter or prescription medicines for pain, fever, or discomfort as directed by your health care provider. °· Take antibiotics as directed by your health care provider. Make sure you finish them it even if you start to feel better. °· Take supplements as directed by your health care provider. °· Consider wearing a medical alert bracelet. This tells anyone caring for you in an emergency of your condition. °· When traveling, keep your medical information, health care provider's names, and the medicines you take with you at all times. °· If you develop a fever, do not take medicines to reduce the fever right away. This could cover up a problem that is developing. Notify your health care provider. °· Keep all follow-up appointments with your health care provider. Sickle cell anemia requires regular medical care. °Contact a health care provider if: °You have a fever. °Get help right away if: °· You feel dizzy or faint. °· You have new abdominal pain, especially on the left side near the stomach area. °· You develop a persistent, often uncomfortable and painful penile erection (priapism). If this is not   treated immediately it will lead to impotence. °· You have numbness your arms or legs or you have a hard time moving them. °· You have a hard time with speech. °· You have a fever or persistent symptoms for more than 2-3 days. °· You have a fever and your symptoms suddenly get  worse. °· You have signs or symptoms of infection. These include: °? Chills. °? Abnormal tiredness (lethargy). °? Irritability. °? Poor eating. °? Vomiting. °· You develop pain that is not helped with medicine. °· You develop shortness of breath. °· You have pain in your chest. °· You are coughing up pus-like or bloody sputum. °· You develop a stiff neck. °· Your feet or hands swell or have pain. °· Your abdomen appears bloated. °· You develop joint pain. °This information is not intended to replace advice given to you by your health care provider. Make sure you discuss any questions you have with your health care provider. °Document Released: 08/14/2005 Document Revised: 11/24/2015 Document Reviewed: 12/16/2012 °Elsevier Interactive Patient Education © 2017 [employer] Inc. ° °

## 2016-04-22 ENCOUNTER — Telehealth: Payer: Self-pay | Admitting: *Deleted

## 2016-04-22 NOTE — Telephone Encounter (Signed)
Medical Assistant left message on patient's home and cell voicemail. Voicemail states to give a call back to [place] with [facility] at [phone].  !!!Please inform patient of hemoglobin improving slightly after transfusion. Patient will continue to be monitored!!!

## 2016-04-22 NOTE — Telephone Encounter (Signed)
-----   Message from Tresa Garter, MD sent at 04/22/2016  3:25 PM EST ----- Please inform patient that her hemoglobin improved slightly to 6.2 post transfusion. We will continue to monitor.

## 2016-04-26 ENCOUNTER — Ambulatory Visit: Payer: Medicaid Other

## 2016-04-30 ENCOUNTER — Ambulatory Visit: Payer: Medicaid Other | Admitting: Family Medicine

## 2016-04-30 ENCOUNTER — Encounter (HOSPITAL_BASED_OUTPATIENT_CLINIC_OR_DEPARTMENT_OTHER): Payer: Medicaid Other | Attending: Surgery

## 2016-04-30 DIAGNOSIS — L97312 Non-pressure chronic ulcer of right ankle with fat layer exposed: Secondary | ICD-10-CM | POA: Insufficient documentation

## 2016-04-30 DIAGNOSIS — G629 Polyneuropathy, unspecified: Secondary | ICD-10-CM | POA: Insufficient documentation

## 2016-04-30 DIAGNOSIS — Z79899 Other long term (current) drug therapy: Secondary | ICD-10-CM | POA: Insufficient documentation

## 2016-04-30 DIAGNOSIS — D571 Sickle-cell disease 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: Secondary | ICD-10-CM | POA: Diagnosis not present

## 2016-04-30 DIAGNOSIS — L97322 Non-pressure chronic ulcer of left ankle with fat layer exposed: Secondary | ICD-10-CM | POA: Insufficient documentation

## 2016-05-01 ENCOUNTER — Ambulatory Visit (HOSPITAL_COMMUNITY)
Admission: RE | Admit: 2016-05-01 | Discharge: 2016-05-01 | Disposition: A | Discharge status: Home / Self Care | Payer: Medicaid Other | Source: Ambulatory Visit | Attending: Surgery | Admitting: Surgery

## 2016-05-01 ENCOUNTER — Encounter: Payer: Self-pay | Admitting: General Practice

## 2016-05-01 ENCOUNTER — Encounter (HOSPITAL_COMMUNITY): Payer: Self-pay

## 2016-05-01 ENCOUNTER — Other Ambulatory Visit: Payer: Self-pay | Admitting: Surgery

## 2016-05-01 ENCOUNTER — Ambulatory Visit (INDEPENDENT_AMBULATORY_CARE_PROVIDER_SITE_OTHER): Payer: Medicaid Other | Admitting: General Practice

## 2016-05-01 DIAGNOSIS — Z3201 Encounter for pregnancy test, result positive: Secondary | ICD-10-CM | POA: Diagnosis present

## 2016-05-01 DIAGNOSIS — M869 Osteomyelitis, unspecified: Secondary | ICD-10-CM

## 2016-05-01 DIAGNOSIS — D571 Sickle-cell disease 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: Secondary | ICD-10-CM | POA: Diagnosis not present

## 2016-05-01 DIAGNOSIS — L97322 Non-pressure chronic ulcer of left ankle with fat layer exposed: Secondary | ICD-10-CM | POA: Diagnosis present

## 2016-05-01 DIAGNOSIS — L97312 Non-pressure chronic ulcer of right ankle with fat layer exposed: Secondary | ICD-10-CM | POA: Diagnosis present

## 2016-05-01 LAB — POCT PREGNANCY, URINE: Preg Test, Ur: POSITIVE — AB

## 2016-05-01 NOTE — Progress Notes (Signed)
Patient here for pregnancy test today. UPT +. Patient reports first positive home test on 04/26/16. LMP 03/20/16 EDD 12/25/16 6 w today. Patient reports taking oxycodone for pain and ibuprofen and prenatal vitamins. Encouraged patient to discontinue use for ibuprofen. Encouraged patient to schedule new OB appt and provided proof of pregnancy letter. Patient had no questions

## 2016-05-12 ENCOUNTER — Inpatient Hospital Stay (HOSPITAL_COMMUNITY): Payer: Medicaid Other

## 2016-05-12 ENCOUNTER — Inpatient Hospital Stay (HOSPITAL_COMMUNITY)
Admission: EM | Admit: 2016-05-12 | Discharge: 2016-05-14 | DRG: 781 | Disposition: A | Discharge status: Home / Self Care | Payer: Medicaid Other | Attending: Internal Medicine | Admitting: Internal Medicine

## 2016-05-12 ENCOUNTER — Encounter (HOSPITAL_COMMUNITY): Payer: Self-pay | Admitting: Oncology

## 2016-05-12 DIAGNOSIS — D5700 Hb-SS disease with crisis, unspecified: Secondary | ICD-10-CM | POA: Diagnosis present

## 2016-05-12 DIAGNOSIS — L97929 Non-pressure chronic ulcer of unspecified part of left lower leg with unspecified severity: Secondary | ICD-10-CM

## 2016-05-12 DIAGNOSIS — Z79899 Other long term (current) drug therapy: Secondary | ICD-10-CM | POA: Diagnosis not present

## 2016-05-12 DIAGNOSIS — G894 Chronic pain syndrome: Secondary | ICD-10-CM | POA: Diagnosis not present

## 2016-05-12 DIAGNOSIS — O200 Threatened abortion: Secondary | ICD-10-CM | POA: Diagnosis not present

## 2016-05-12 DIAGNOSIS — N939 Abnormal uterine and vaginal bleeding, unspecified: Secondary | ICD-10-CM

## 2016-05-12 DIAGNOSIS — L97919 Non-pressure chronic ulcer of unspecified part of right lower leg with unspecified severity: Secondary | ICD-10-CM

## 2016-05-12 DIAGNOSIS — O99011 Anemia complicating pregnancy, first trimester: Principal | ICD-10-CM | POA: Diagnosis present

## 2016-05-12 DIAGNOSIS — Z3A01 Less than 8 weeks gestation of pregnancy: Secondary | ICD-10-CM | POA: Diagnosis not present

## 2016-05-12 DIAGNOSIS — D571 Sickle-cell disease 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: Secondary | ICD-10-CM

## 2016-05-12 DIAGNOSIS — L97319 Non-pressure chronic ulcer of right ankle with unspecified severity: Secondary | ICD-10-CM | POA: Diagnosis present

## 2016-05-12 DIAGNOSIS — Z832 Family history of diseases of the blood and blood-forming organs and certain disorders involving the immune mechanism: Secondary | ICD-10-CM

## 2016-05-12 DIAGNOSIS — Z3490 Encounter for supervision of normal pregnancy, unspecified, unspecified trimester: Secondary | ICD-10-CM | POA: Insufficient documentation

## 2016-05-12 LAB — RETICULOCYTES
RBC.: 2.11 MIL/uL — ABNORMAL LOW (ref 3.87–5.11)
RETIC CT PCT: 19.9 % — AB (ref 0.4–3.1)
Retic Count, Absolute: 419.9 10*3/uL — ABNORMAL HIGH (ref 19.0–186.0)

## 2016-05-12 LAB — CBC WITH DIFFERENTIAL/PLATELET
BASOS ABS: 0.1 10*3/uL (ref 0.0–0.1)
Basophils Relative: 1 %
EOS ABS: 0.3 10*3/uL (ref 0.0–0.7)
Eosinophils Relative: 2 %
HCT: 17.1 % — ABNORMAL LOW (ref 36.0–46.0)
Hemoglobin: 6.3 g/dL — CL (ref 12.0–15.0)
LYMPHS PCT: 20 %
Lymphs Abs: 2.6 10*3/uL (ref 0.7–4.0)
MCH: 29.9 pg (ref 26.0–34.0)
MCHC: 36.8 g/dL — AB (ref 30.0–36.0)
MCV: 81 fL (ref 78.0–100.0)
MONO ABS: 1.7 10*3/uL — AB (ref 0.1–1.0)
Monocytes Relative: 13 %
NEUTROS ABS: 8.1 10*3/uL — AB (ref 1.7–7.7)
Neutrophils Relative %: 64 %
PLATELETS: 395 10*3/uL (ref 150–400)
RBC: 2.11 MIL/uL — ABNORMAL LOW (ref 3.87–5.11)
RDW: 20.8 % — AB (ref 11.5–15.5)
WBC: 12.8 10*3/uL — ABNORMAL HIGH (ref 4.0–10.5)

## 2016-05-12 LAB — COMPREHENSIVE METABOLIC PANEL
ALBUMIN: 4.6 g/dL (ref 3.5–5.0)
ALK PHOS: 59 U/L (ref 38–126)
ALT: 33 U/L (ref 14–54)
ANION GAP: 8 (ref 5–15)
AST: 54 U/L — ABNORMAL HIGH (ref 15–41)
BILIRUBIN TOTAL: 4.3 mg/dL — AB (ref 0.3–1.2)
BUN: 7 mg/dL (ref 6–20)
CALCIUM: 8.9 mg/dL (ref 8.9–10.3)
CO2: 24 mmol/L (ref 22–32)
CREATININE: 0.35 mg/dL — AB (ref 0.44–1.00)
Chloride: 102 mmol/L (ref 101–111)
GFR calc non Af Amer: >60 mL/min (ref >60)
GLUCOSE: 100 mg/dL — AB (ref 65–99)
Potassium: 3.2 mmol/L — ABNORMAL LOW (ref 3.5–5.1)
Sodium: 134 mmol/L — ABNORMAL LOW (ref 135–145)
TOTAL PROTEIN: 8.8 g/dL — AB (ref 6.5–8.1)

## 2016-05-12 LAB — PREPARE RBC (CROSSMATCH)

## 2016-05-12 MED ORDER — HYDROMORPHONE HCL 2 MG/ML IJ SOLN
0.5000 mg | INTRAMUSCULAR | Status: AC
  Administered 2016-05-12: 1 mg via INTRAVENOUS
  Filled 2016-05-12: qty 1

## 2016-05-12 MED ORDER — DOCUSATE SODIUM 100 MG PO CAPS
100.0000 mg | ORAL_CAPSULE | Freq: Two times a day (BID) | ORAL | Status: DC
  Administered 2016-05-12 – 2016-05-14 (×5): 100 mg via ORAL
  Filled 2016-05-12 (×5): qty 1

## 2016-05-12 MED ORDER — BISACODYL 5 MG PO TBEC: 5.0000 mg | DELAYED_RELEASE_TABLET | Freq: Every day | ORAL | Status: DC | PRN

## 2016-05-12 MED ORDER — ACETAMINOPHEN 500 MG PO TABS
500.0000 mg | ORAL_TABLET | ORAL | Status: DC | PRN
  Administered 2016-05-14: 500 mg via ORAL
  Filled 2016-05-12: qty 1

## 2016-05-12 MED ORDER — DIPHENHYDRAMINE HCL 25 MG PO CAPS: 25.0000 mg | ORAL_CAPSULE | ORAL | Status: DC | PRN

## 2016-05-12 MED ORDER — HYDROMORPHONE HCL 2 MG/ML IJ SOLN
0.5000 mg | INTRAMUSCULAR | Status: AC
  Administered 2016-05-12: 1 mg via INTRAVENOUS

## 2016-05-12 MED ORDER — SODIUM CHLORIDE 0.9 % IV SOLN
Freq: Once | INTRAVENOUS | Status: AC
  Administered 2016-05-12: 14:00:00 via INTRAVENOUS

## 2016-05-12 MED ORDER — HYDROMORPHONE HCL 2 MG/ML IJ SOLN: 0.5000 mg | INTRAMUSCULAR | Status: AC

## 2016-05-12 MED ORDER — DEXTROSE-NACL 5-0.45 % IV SOLN
INTRAVENOUS | Status: DC
  Administered 2016-05-12: 12:00:00 via INTRAVENOUS

## 2016-05-12 MED ORDER — PRENATAL 27-0.8 MG PO TABS
1.0000 | ORAL_TABLET | Freq: Every day | ORAL | Status: DC
  Administered 2016-05-12 – 2016-05-14 (×3): 1 via ORAL
  Filled 2016-05-12 (×3): qty 1

## 2016-05-12 MED ORDER — ENOXAPARIN SODIUM 40 MG/0.4ML ~~LOC~~ SOLN
40.0000 mg | SUBCUTANEOUS | Status: DC
  Administered 2016-05-12 – 2016-05-13 (×2): 40 mg via SUBCUTANEOUS
  Filled 2016-05-12 (×2): qty 0.4

## 2016-05-12 MED ORDER — HYDROMORPHONE HCL 2 MG/ML IJ SOLN
0.5000 mg | INTRAMUSCULAR | Status: DC
  Administered 2016-05-12: 1 mg via INTRAVENOUS
  Filled 2016-05-12: qty 1

## 2016-05-12 MED ORDER — ONDANSETRON HCL 4 MG/2ML IJ SOLN: 4.0000 mg | Freq: Four times a day (QID) | INTRAMUSCULAR | Status: DC | PRN

## 2016-05-12 MED ORDER — FOLIC ACID 1 MG PO TABS
1.0000 mg | ORAL_TABLET | Freq: Every day | ORAL | Status: DC
Start: 2016-05-12 — End: 2016-05-14
  Administered 2016-05-12 – 2016-05-14 (×3): 1 mg via ORAL
  Filled 2016-05-12 (×3): qty 1

## 2016-05-12 MED ORDER — SODIUM CHLORIDE 0.9% FLUSH: 9.0000 mL | INTRAVENOUS | Status: DC | PRN

## 2016-05-12 MED ORDER — DEXTROSE-NACL 5-0.45 % IV SOLN
INTRAVENOUS | Status: DC
  Administered 2016-05-12: 06:00:00 via INTRAVENOUS

## 2016-05-12 MED ORDER — NALOXONE HCL 0.4 MG/ML IJ SOLN: 0.4000 mg | INTRAMUSCULAR | Status: DC | PRN

## 2016-05-12 MED ORDER — ORAL CARE MOUTH RINSE
15.0000 mL | Freq: Two times a day (BID) | OROMUCOSAL | Status: DC
  Administered 2016-05-12 – 2016-05-14 (×4): 15 mL via OROMUCOSAL

## 2016-05-12 MED ORDER — HYDROMORPHONE 1 MG/ML IV SOLN
INTRAVENOUS | Status: DC
  Administered 2016-05-12: 4.2 mg via INTRAVENOUS
  Administered 2016-05-12: 12:00:00 via INTRAVENOUS
  Administered 2016-05-12: 9 mg via INTRAVENOUS
  Administered 2016-05-13: 8.4 mg via INTRAVENOUS
  Administered 2016-05-13: 1.8 mg via INTRAVENOUS
  Administered 2016-05-13: 8.4 mg via INTRAVENOUS
  Administered 2016-05-13: 4.8 mg via INTRAVENOUS
  Administered 2016-05-13: 1 mg via INTRAVENOUS
  Administered 2016-05-13: 02:00:00 via INTRAVENOUS
  Administered 2016-05-14: 4.2 mg via INTRAVENOUS
  Administered 2016-05-14: 3.6 mg via INTRAVENOUS
  Administered 2016-05-14: 2.97 mg via INTRAVENOUS
  Administered 2016-05-14: 6 mg via INTRAVENOUS
  Administered 2016-05-14: 11:00:00 via INTRAVENOUS
  Filled 2016-05-12 (×4): qty 25

## 2016-05-12 MED ORDER — SODIUM CHLORIDE 0.9 % IV SOLN
25.0000 mg | INTRAVENOUS | Status: DC | PRN
  Filled 2016-05-12: qty 0.5

## 2016-05-12 MED ORDER — HYDROMORPHONE HCL 2 MG/ML IJ SOLN: 0.5000 mg | INTRAMUSCULAR | Status: DC

## 2016-05-12 NOTE — ED Notes (Signed)
Redress feet bil with non stick dressing and kling flushed with sterile saline pt tolerated well. Place on O2 2L for sats 88% informed Dr Ralene Bathe.

## 2016-05-12 NOTE — ED Notes (Signed)
No respiratory or acute distress noted alert and oriented x 3 call light in reach warm blanket given.

## 2016-05-12 NOTE — Progress Notes (Signed)
Pt was sitting up in bed having dinner when [facility] arrived. She was very soft-spoken. She said she was doing ok. Offered [facility] services if needed. Please page if additional support is needed. Chaplain Ernest Haber, M.Div.   05/12/16 1800  Clinical Encounter Type  Visited With Patient

## 2016-05-12 NOTE — ED Notes (Signed)
Patient taken to floor by RN 

## 2016-05-12 NOTE — H&P (Signed)
H&P   Patient Demographics:    [name], is a 45 y.o. female  MRN: [id number]   DOB - [birth year]  Admit Date - 05/12/2016  Outpatient Primary MD for the patient is [name], FNP  Chief Complaint  Patient presents with  . Sickle Cell Pain Crisis     HPI:    [name]  is a 45 y.o. female with history of sickle cell disease  Who presented to the Emergency Department complaining of sickle cell pain crisis. She reports 3 days of pain to the right leg, diffusely as well as the left upper arm. Pain is typical of her sickle cell pain crisis. She is taking oxycodone 10 mg, every 4 hours at home with no change in her symptoms. She has chronic wounds to both feet with ongoing drainage, doing daily dressing. These are unchanged from her baseline. She is currently [redacted] weeks pregnant. She denies any abdominal pain, vaginal bleeding, nausea, vomiting. Her baseline Hb is usually around 7.   ED Course: Patient's vitals: BP 127/74 (BP Location: Left Arm)   Pulse 71   Temp 98.2 F (36.8 C) (Oral)   Resp 18   LMP 03/19/2016  SpO2 93%. Patient was treated with standard sickle cell pain crisis control. Her pain remained uncontrolled so I was called to admit patient for Sickle Cell Pain Crisis.    Review of systems:    In addition to the HPI above, patient reports No Fever-chills, No Headache, No changes with Vision or hearing No problems swallowing food or Liquids No Chest pain, Cough or Shortness of Breath No Abdominal pain, No Nausea or Vomiting, Bowel movements are regular No Blood in stool or Urine No dysuria No new joints pains-aches No new weakness, tingling, numbness in any extremity No recent weight gain or loss No polyuria, polydypsia or polyphagia No significant Mental Stressors  A full 10 point Review of Systems was done, except as stated above, all other Review of Systems were negative.  With Past History of the following :   Past Medical History:  Diagnosis Date   . Anemia 03/30/2012   Hx of sickle cell disease  . Anemia complicating pregnancy [id number]  . CAP (community acquired pneumonia)    2014  . Cholelithiasis y-1  . Leg ulcer ([place]) 10/27/2012   Chronic under care of wound clinic  . Sickle cell disease ([place])       Past Surgical History:  Procedure Laterality Date  . ALLOGRAFT APPLICATION Right [id number]   Procedure: SURGICAL PREP FOR GRAFTING RIGHT LOWER EXTREMITY AND APPLICATION OF Jannifer Hick;  Surgeon: Irene Limbo, MD;  Location: [place];  Service: Plastics;  Laterality: Right;  . APPLICATION OF A-CELL OF EXTREMITY Right 10/09/2015   Procedure: APPLICATION OF Jannifer Hick;  Surgeon: Irene Limbo, MD;  Location: [place];  Service: Plastics;  Laterality: Right;  . CESAREAN SECTION    . CESAREAN SECTION N/A 01/06/2014   Procedure: CESAREAN SECTION;  Surgeon: Emily Filbert, MD;  Location: [facility];  Service: Obstetrics;  Laterality: N/A;  . I&D EXTREMITY Right 10/09/2015   Procedure: SURGICAL PREPARATION FOR GRAFTING RIGHT ANKLE AND APPLICATION THERASKIN;  Surgeon: Irene Limbo, MD;  Location: [facility];  Service: Plastics;  Laterality: Right;      Social History:     Social History  Substance Use Topics  . Smoking status: Never Smoker  . Smokeless tobacco: Never Used  . Alcohol use No     Lives - At  home   Family History :     Family History  Problem Relation Age of Onset  . Sickle cell anemia Sister   . Diabetes Mother      Home Medications:   Prior to Admission medications   Medication Sig Start Date End Date Taking? Authorizing Provider  acetaminophen (TYLENOL) 500 MG tablet Take 500 mg by mouth every 4 (four) hours as needed for moderate pain.   Yes Historical Provider, MD  folic acid (FOLVITE) 1 MG tablet Take 1 tablet (1 mg total) by mouth daily. 03/21/16  Yes [name], NP  ketoconazole (NIZORAL) 2 % cream Apply 1 application topically daily. 12/14/15   Yes [name], NP  Oxycodone HCl 10 MG TABS Take 1 tablet (10 mg total) by mouth every 4 (four) hours as needed. Patient taking differently: Take 10 mg by mouth every 4 (four) hours as needed (pain).  04/16/16  Yes Tresa Garter, MD  Prenatal Vit-Fe Fumarate-FA (PRENATAL PO) Take 1 tablet by mouth daily.   Yes Historical Provider, MD  Cholecalciferol (VITAMIN D) 2000 UNITS CAPS Take 1 capsule (2,000 Units total) by mouth daily. Patient not taking: Reported on 05/12/2016 03/05/15   [name], FNP  ibuprofen (ADVIL,MOTRIN) 600 MG tablet TAKE 1 TABLET BY MOUTH EVERY 8 HOURS AS NEEDED FOR MILD PAIN. Patient not taking: Reported on 05/12/2016 01/16/16   [name], NP     Allergies:    No Known Allergies   Physical Exam:  Vitals:  Vitals:   05/12/16 0618 05/12/16 0650  BP: 111/67 107/66  Pulse: 71 63  Resp: 14 15  Temp:      Physical Exam: Constitutional: Patient appears well-developed and well-nourished. Not in obvious distress. HENT: Normocephalic, atraumatic, External right and left ear normal. Oropharynx is clear and moist.  Eyes: Conjunctivae and EOM are normal. PERRLA, no scleral icterus. Neck: Normal ROM. Neck supple. No JVD. No tracheal deviation. No thyromegaly. CVS: RRR, S1/S2 +, no murmurs, no gallops, no carotid bruit.  Pulmonary: Effort and breath sounds normal, no stridor, rhonchi, wheezes, rales.  Abdominal: Soft. BS +, no distension, tenderness, rebound or guarding.  Musculoskeletal: Normal range of motion. 2+ DP pulses bilaterally. Large ulcerations to bilateral ankle with scant drainage. Right bigger than left. No surrounding erythema or induration, no evidence of infection Lymphadenopathy: No lymphadenopathy noted, cervical, inguinal or axillary Neuro: Alert. Normal reflexes, muscle tone coordination. No cranial nerve deficit. Skin: Skin is warm and dry. No rash noted. Not diaphoretic. No erythema. No pallor. Psychiatric: Normal mood and  affect. Behavior, judgment, thought content normal.   Data Review:    CBC  Recent Labs Lab 05/12/16 0350  WBC 12.8*  HGB 6.3*  HCT 17.1*  PLT 395  MCV 81.0  MCH 29.9  MCHC 36.8*  RDW 20.8*  LYMPHSABS 2.6  MONOABS 1.7*  EOSABS 0.3  BASOSABS 0.1    Chemistries   Recent Labs Lab 05/12/16 0350  NA 134*  K 3.2*  CL 102  CO2 24  GLUCOSE 100*  BUN 7  CREATININE 0.35*  CALCIUM 8.9  AST 54*  ALT 33  ALKPHOS 59  BILITOT 4.3*   ------------------------------------------------------------------------------------------------------------------ CrCl cannot be calculated (Unknown ideal weight.). ------------------------------------------------------------------------------------------------------------------ No results for input(s): TSH, T4TOTAL, T3FREE, THYROIDAB in the last 72 hours.  Invalid input(s): FREET3  Coagulation profile No results for input(s): INR, PROTIME in the last 168 hours. ------------------------------------------------------------------------------------------------------------------- No results for input(s): DDIMER in the last 72 hours. -------------------------------------------------------------------------------------------------------------------  Cardiac Enzymes  No results for input(s): CKMB, TROPONINI, MYOGLOBIN in the last 168 hours.  Invalid input(s): CK ------------------------------------------------------------------------------------------------------------------ No results found for: BNP  ---------------------------------------------------------------------------------------------------------------  Urinalysis    Component Value Date/Time   COLORURINE AMBER (A) 04/02/2016 [place] 04/02/2016 1610   LABSPEC 1.011 04/02/2016 1610   PHURINE 6.5 04/02/2016 1610   GLUCOSEU NEGATIVE 04/02/2016 1610   HGBUR NEGATIVE 04/02/2016 1610   BILIRUBINUR NEGATIVE 04/02/2016 1610   KETONESUR NEGATIVE 04/02/2016 1610    PROTEINUR NEGATIVE 04/02/2016 1610   UROBILINOGEN 0.2 08/25/2015 1616   NITRITE NEGATIVE 04/02/2016 1610   LEUKOCYTESUR NEGATIVE 04/02/2016 1610   ----------------------------------------------------------------------------------------------------------------   Imaging Results:      Assessment & Plan:    Active Problems:   Sickle cell anemia with crisis (HCC)  1. Vaso-occlussive sickle cell pain crisis  - Will admit per sickle cell protocol for pain control.   - Weight based high-dose IV hydromorphone PCA with continuous ETCO2 monitoring - Patient unable to get Ketorolac because of pregnancy - IVF D5 .45% saline @ 100 cc/hr  - Continue folic acid and prenatal vitamin - Diphenhydramine, phenergan and bowel regimen  2. Sickle Cell Anemia  - Hb is 6.3 - Will transfuse 1 unit of PRBC on account of early pregnancy  - Order Hemoglobinopathy Evaluation, patient may need exchange transfusion soon to reduce Hb S concentration during pregnancy - Monitor H and H closely - Lab in am  3. Non-pressure chronic ulcer of ankle, right, with unspecified severity ([place])  - CBC with Differential - Wound Care Consult  DVT Prophylaxis: Subcut Lovenox   AM Labs Ordered, also please review Full Orders  Family Communication: Admission, patient's condition and plan of care including tests being ordered have been discussed with the patient who indicate understanding and agree with the plan and Code Status.  Code Status: Full Code  Consults called: None    Admission status: Inpatient    Time spent in minutes : 50 minutes  Raynesha Tiedt MD, MHA, CPE, FACP, FAAP 05/12/2016 at 9:38 AM

## 2016-05-12 NOTE — ED Triage Notes (Signed)
Pt c/o SCC pain as well as pain from a wound on her right foot.  Pt is [redacted] weeks pregnant.  Pt states she just has not felt well since Thursday.

## 2016-05-12 NOTE — ED Notes (Signed)
No respiratory or acute distress noted alert and oriented x 3 call light in reach no reaction to medication noted,

## 2016-05-12 NOTE — ED Provider Notes (Signed)
[facility] DEPT Provider Note   CSN: [id number] Arrival date & time: 05/12/16  0308     History   Chief Complaint Chief Complaint  Patient presents with  . Sickle Cell Pain Crisis    HPI [name] is a 45 y.o. female.  The history is provided by the patient. No language interpreter was used.  Sickle Cell Pain Crisis   [name] is a 45 y.o. female who presents to the Emergency Department complaining of sickle cell pain crisis.  She reports 3 days of pain to the right leg, diffusely as well as the left upper arm. Pain is typical of her sickle cell pain crisis. She is taking oxycodone 10 mg, every 4 hours at home with no change in her symptoms. She has chronic wounds to both feet with ongoing drainage. These are unchanged from her baseline. She is currently [redacted] weeks pregnant. She denies any abdominal pain, vaginal bleeding, nausea, vomiting. Symptoms are severe, constant, worsening. Past Medical History:  Diagnosis Date  . Anemia 03/30/2012   Hx of sickle cell disease  . Anemia complicating pregnancy [id number]  . CAP (community acquired pneumonia)    2014  . Cholelithiasis y-1  . Leg ulcer ([place]) 10/27/2012   Chronic under care of wound clinic  . Sickle cell disease [facility])     Patient Active Problem List   Diagnosis Date Noted  . Sickle cell anemia with crisis ([place]) 04/02/2016  . Rash and nonspecific skin eruption 03/28/2015  . Non-pressure chronic ulcer of ankle, right, with unspecified severity ([place]) 02/14/2015  . Hb-SS disease 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 ([place]) 03/24/2014  . IUGR (intrauterine growth restriction) 01/06/2014  . Anemia complicating pregnancy [id number]  . Leukocytosis, unspecified 10/21/2013  . Hx of preeclampsia, prior pregnancy, currently pregnant 07/22/2013  . AMA--age > 40 07/22/2013  . Previous cesarean delivery, antepartum condition or complication [id number]  . History of preterm delivery, currently pregnant 07/22/2013  . Sickle cell anemia  affecting pregnancy, antepartum 07/15/2013  . High risk pregnancy due to history of previous obstetrical problem 07/15/2013  . 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 Hemochromatosis 11/10/2012  . Chronic pain syndrome 10/13/2012  . Sinus bradycardia by electrocardiogram 04/03/2012  . Chronic wound of extremity 04/01/2012  . Elevated LFTs 03/30/2012  . Cholelithiasis 03/30/2012  . Leukocytosis 03/30/2012    Past Surgical History:  Procedure Laterality Date  . ALLOGRAFT APPLICATION Right [id number]   Procedure: SURGICAL PREP FOR GRAFTING RIGHT LOWER EXTREMITY AND APPLICATION OF Jannifer Hick;  Surgeon: Irene Limbo, MD;  Location: [place];  Service: Plastics;  Laterality: Right;  . APPLICATION OF A-CELL OF EXTREMITY Right 10/09/2015   Procedure: APPLICATION OF Jannifer Hick;  Surgeon: Irene Limbo, MD;  Location: [place];  Service: Plastics;  Laterality: Right;  . CESAREAN SECTION    . CESAREAN SECTION N/A 01/06/2014   Procedure: CESAREAN SECTION;  Surgeon: Emily Filbert, MD;  Location: [facility];  Service: Obstetrics;  Laterality: N/A;  . I&D EXTREMITY Right 10/09/2015   Procedure: SURGICAL PREPARATION FOR GRAFTING RIGHT ANKLE AND APPLICATION THERASKIN;  Surgeon: Irene Limbo, MD;  Location: [place];  Service: Plastics;  Laterality: Right;    OB History    Gravida Para Term Preterm AB Living   7 4 1 3 2 2    SAB TAB Ectopic Multiple Live Births   2       3       Home Medications    Prior to Admission medications   Medication Sig Start  Date End Date Taking? Authorizing Provider  acetaminophen (TYLENOL) 500 MG tablet Take 500 mg by mouth every 4 (four) hours as needed for moderate pain.   Yes Historical Provider, MD  folic acid (FOLVITE) 1 MG tablet Take 1 tablet (1 mg total) by mouth daily. 03/21/16  Yes [name], NP  ketoconazole (NIZORAL) 2 % cream Apply 1 application topically daily. 12/14/15  Yes [name], NP    Oxycodone HCl 10 MG TABS Take 1 tablet (10 mg total) by mouth every 4 (four) hours as needed. Patient taking differently: Take 10 mg by mouth every 4 (four) hours as needed (pain).  04/16/16  Yes Tresa Garter, MD  Prenatal Vit-Fe Fumarate-FA (PRENATAL PO) Take 1 tablet by mouth daily.   Yes Historical Provider, MD  Cholecalciferol (VITAMIN D) 2000 UNITS CAPS Take 1 capsule (2,000 Units total) by mouth daily. Patient not taking: Reported on 05/12/2016 03/05/15   [name], FNP  ibuprofen (ADVIL,MOTRIN) 600 MG tablet TAKE 1 TABLET BY MOUTH EVERY 8 HOURS AS NEEDED FOR MILD PAIN. Patient not taking: Reported on 05/12/2016 01/16/16   [name], NP    Family History Family History  Problem Relation Age of Onset  . Sickle cell anemia Sister   . Diabetes Mother     Social History Social History  Substance Use Topics  . Smoking status: Never Smoker  . Smokeless tobacco: Never Used  . Alcohol use No     Allergies   Patient has no known allergies.   Review of Systems Review of Systems  All other systems reviewed and are negative.    Physical Exam Updated Vital Signs BP 127/74 (BP Location: Left Arm)   Pulse 71   Temp 98.2 F (36.8 C) (Oral)   Resp 18   LMP 03/19/2016 Comment: patient double shielded  SpO2 93%   Physical Exam  Constitutional: She is oriented to person, place, and time. She appears well-developed and well-nourished.  Uncomfortable appearing  HENT:  Head: Normocephalic and atraumatic.  Cardiovascular: Normal rate and regular rhythm.   No murmur heard. Pulmonary/Chest: Effort normal and breath sounds normal. No respiratory distress.  Abdominal: Soft. There is no tenderness. There is no rebound and no guarding.  Musculoskeletal:  2+ DP pulses bilaterally. Large ulcerations to bilateral malleoli of the right ankle with scant drainage. No surrounding erythema or induration. Small ulcerations to medial and lateral left ankle.  Neurological:  She is alert and oriented to person, place, and time.  Skin: Skin is warm and dry.  Psychiatric: She has a normal mood and affect. Her behavior is normal.  Nursing note and vitals reviewed.    ED Treatments / Results  Labs (all labs ordered are listed, but only abnormal results are displayed) Labs Reviewed  COMPREHENSIVE METABOLIC PANEL - Abnormal; Notable for the following:       Result Value   Sodium 134 (*)    Potassium 3.2 (*)    Glucose, Bld 100 (*)    Creatinine, Ser 0.35 (*)    Total Protein 8.8 (*)    AST 54 (*)    Total Bilirubin 4.3 (*)    All other components within normal limits  CBC WITH DIFFERENTIAL/PLATELET - Abnormal; Notable for the following:    WBC 12.8 (*)    RBC 2.11 (*)    Hemoglobin 6.3 (*)    HCT 17.1 (*)    MCHC 36.8 (*)    RDW 20.8 (*)    Neutro Abs  8.1 (*)    Monocytes Absolute 1.7 (*)    All other components within normal limits  RETICULOCYTES - Abnormal; Notable for the following:    Retic Ct Pct 19.9 (*)    RBC. 2.11 (*)    Retic Count, Manual 419.9 (*)    All other components within normal limits    EKG  EKG Interpretation None       Radiology No results found.  Procedures Procedures (including critical care time)  Medications Ordered in ED Medications  dextrose 5 %-0.45 % sodium chloride infusion (not administered)  HYDROmorphone (DILAUDID) injection 0.5-1 mg (not administered)    Or  HYDROmorphone (DILAUDID) injection 0.5-1 mg (not administered)  HYDROmorphone (DILAUDID) injection 0.5-1 mg (not administered)    Or  HYDROmorphone (DILAUDID) injection 0.5-1 mg (not administered)  HYDROmorphone (DILAUDID) injection 0.5-1 mg (not administered)    Or  HYDROmorphone (DILAUDID) injection 0.5-1 mg (not administered)  HYDROmorphone (DILAUDID) injection 0.5-1 mg (not administered)    Or  HYDROmorphone (DILAUDID) injection 0.5-1 mg (not administered)     Initial Impression / Assessment and Plan / ED Course  I have reviewed  the triage vital signs and the nursing notes.  Pertinent labs & imaging results that were available during my care of the patient were reviewed by me and considered in my medical decision making (see chart for details).  Clinical Course     Patient with history of sickle cell here with pain in her right leg and left arm that is similar to prior pain crises. She has a chronic ulcer to her right ankle. She states the ulcer has been worsening over the last week. It appears chronic with no evidence of overwhelming infection. She has ongoing pain in the emergency department despite medications. Plan to admit for pain control and possible wound consults.  Final Clinical Impressions(s) / ED Diagnoses   Final diagnoses:  None    New Prescriptions New Prescriptions   No medications on file     Quintella Reichert, MD 05/12/16 2228

## 2016-05-12 NOTE — ED Notes (Signed)
Report given to floor RN

## 2016-05-12 NOTE — ED Notes (Signed)
Pt w/ critical hgb of 6.2.  Pt's baseline appears to be around 7.

## 2016-05-13 DIAGNOSIS — O200 Threatened abortion: Secondary | ICD-10-CM

## 2016-05-13 DIAGNOSIS — N939 Abnormal uterine and vaginal bleeding, unspecified: Secondary | ICD-10-CM

## 2016-05-13 LAB — COMPREHENSIVE METABOLIC PANEL
ALT: 25 U/L (ref 14–54)
ANION GAP: 5 (ref 5–15)
AST: 40 U/L (ref 15–41)
Albumin: 3.7 g/dL (ref 3.5–5.0)
Alkaline Phosphatase: 39 U/L (ref 38–126)
BUN: <5 mg/dL — ABNORMAL LOW (ref 6–20)
CHLORIDE: 102 mmol/L (ref 101–111)
CO2: 27 mmol/L (ref 22–32)
Calcium: 8.3 mg/dL — ABNORMAL LOW (ref 8.9–10.3)
Glucose, Bld: 98 mg/dL (ref 65–99)
POTASSIUM: 3.1 mmol/L — AB (ref 3.5–5.1)
SODIUM: 134 mmol/L — AB (ref 135–145)
Total Bilirubin: 4.2 mg/dL — ABNORMAL HIGH (ref 0.3–1.2)
Total Protein: 7.1 g/dL (ref 6.5–8.1)

## 2016-05-13 LAB — CBC
HCT: 16.7 % — ABNORMAL LOW (ref 36.0–46.0)
HEMOGLOBIN: 6.1 g/dL — AB (ref 12.0–15.0)
MCH: 29.9 pg (ref 26.0–34.0)
MCHC: 36.5 g/dL — ABNORMAL HIGH (ref 30.0–36.0)
MCV: 81.9 fL (ref 78.0–100.0)
PLATELETS: 321 10*3/uL (ref 150–400)
RBC: 2.04 MIL/uL — AB (ref 3.87–5.11)
RDW: 19.5 % — ABNORMAL HIGH (ref 11.5–15.5)
WBC: 10 10*3/uL (ref 4.0–10.5)

## 2016-05-13 LAB — PREPARE RBC (CROSSMATCH)

## 2016-05-13 LAB — HCG, QUANTITATIVE, PREGNANCY: hCG, Beta Chain, Quant, S: 7735 m[IU]/mL — ABNORMAL HIGH (ref <5)

## 2016-05-13 MED ORDER — SODIUM CHLORIDE 0.9 % IV SOLN
Freq: Once | INTRAVENOUS | Status: AC
  Administered 2016-05-13: 14:00:00 via INTRAVENOUS

## 2016-05-13 NOTE — Progress Notes (Signed)
[facility]  Called by rn/ Pt passed small quarter sized clot, "distressed that she is losing her baby"  [place] vag 05/12/16   IMPRESSION: Gestational sac corresponding to a gestational age of [redacted] weeks 2 days. No fetal pole is noted at this time. Follow-up imaging is recommended as clinically indicated.  Here for sickle cell crisis - mgmt does not change at this time, continue mgmt per day team - recd bedrest, hydration - consider ob eval in am.

## 2016-05-13 NOTE — Progress Notes (Signed)
Called and spoke to Dr. Janne Napoleon to relay that Ms.Dell noted a clot in the [facility] when she voided.  She is concerned that she that she is having a miscarriage.  Dr. Janne Napoleon stated to encourage Ms Kneer to stay in bed as much as possible and to avoid excessive activity.

## 2016-05-13 NOTE — Progress Notes (Signed)
Patient ID: [name], female   DOB: 1971-05-14, 45 y.o.   MRN: [id number] Subjective:  [name]  is a 45 y.o. female with history of sickle cell disease admitted yesterday for pain crisis. She is [redacted] weeks pregnant but started spotting yesterday. Transvaginal USS showed intact gestational sac but no fetal poel. HCG quantitative today is within normal. Patient had another episode of spotting this morning. Hb is down to 6.1 despite transfusion of 1 unit of PRBC. She said her pain is much improved, she asked if she could be discharged home. No fever, no abdominal or pelvic pain. No dysuria.  Objective:  Vital signs in last 24 hours:  Vitals:   05/13/16 0621 05/13/16 0800 05/13/16 0955 05/13/16 1115  BP: (!) 103/57  (!) 107/54   Pulse: 69  76   Resp: 17 17 19 19   Temp: 98.3 F (36.8 C)  98.1 F (36.7 C)   TempSrc: Oral  Oral   SpO2: 98% 100% 97% 100%  Weight: 56.3 kg (124 lb 3.2 oz)     Height:        Intake/Output from previous day:   Intake/Output Summary (Last 24 hours) at 05/13/16 1332 Last data filed at 05/13/16 1021  Gross per 24 hour  Intake             3615 ml  Output             2050 ml  Net             1565 ml    Physical Exam: General: Alert, awake, oriented x3, in no acute distress.  HEENT: [place]/AT PEERL, EOMI Neck: Trachea midline,  no masses, no thyromegal,y no JVD, no carotid bruit OROPHARYNX:  Moist, No exudate/ erythema/lesions.  Heart: Regular rate and rhythm, without murmurs, rubs, gallops, PMI non-displaced, no heaves or thrills on palpation.  Lungs: Clear to auscultation, no wheezing or rhonchi noted. No increased vocal fremitus resonant to percussion  Abdomen: Soft, nontender, nondistended, positive bowel sounds, no masses no hepatosplenomegaly noted..  Neuro: No focal neurological deficits noted cranial nerves II through XII grossly intact. DTRs 2+ bilaterally upper and lower extremities. Strength 5 out of 5 in bilateral upper and lower  extremities. Musculoskeletal: No warm swelling or erythema around joints, no spinal tenderness noted. Psychiatric: Patient alert and oriented x3, good insight and cognition, good recent to remote recall. Lymph node survey: No cervical axillary or inguinal lymphadenopathy noted.  Lab Results:  Basic Metabolic Panel:    Component Value Date/Time   NA 134 (L) 05/13/2016 0408   NA 139 01/20/2014 0758   K 3.1 (L) 05/13/2016 0408   K 3.9 01/20/2014 0758   CL 102 05/13/2016 0408   CO2 27 05/13/2016 0408   CO2 23 01/20/2014 0758   BUN <5 (L) 05/13/2016 0408   BUN 6.0 (L) 01/20/2014 0758   CREATININE <0.30 (L) 05/13/2016 0408   CREATININE 0.30 (L) 11/06/2015 1412   CREATININE 0.5 (L) 01/20/2014 0758   GLUCOSE 98 05/13/2016 0408   GLUCOSE 72 01/20/2014 0758   CALCIUM 8.3 (L) 05/13/2016 0408   CALCIUM 9.0 01/20/2014 0758   CBC:    Component Value Date/Time   WBC 10.0 05/13/2016 0408   HGB 6.1 (LL) 05/13/2016 0408   HGB 8.7 (L) 01/20/2014 0757   HCT 16.7 (L) 05/13/2016 0408   HCT 25.2 (L) 01/20/2014 0757   PLT 321 05/13/2016 0408   PLT 611 (H) 01/20/2014 0757   MCV 81.9 05/13/2016 0408  MCV 84.3 01/20/2014 0757   NEUTROABS 8.1 (H) 05/12/2016 0350   NEUTROABS 8.7 (H) 01/20/2014 0757   LYMPHSABS 2.6 05/12/2016 0350   LYMPHSABS 2.7 01/20/2014 0757   MONOABS 1.7 (H) 05/12/2016 0350   MONOABS 1.9 (H) 01/20/2014 0757   EOSABS 0.3 05/12/2016 0350   EOSABS 0.5 01/20/2014 0757   BASOSABS 0.1 05/12/2016 0350   BASOSABS 0.2 (H) 01/20/2014 0757    No results found for this or any previous visit (from the past 240 hour(s)).  Studies/Results: US Ob Comp Less 14 Wks  Result Date: 05/12/2016 CLINICAL DATA:  Recent spotting EXAM: OBSTETRIC <14 WK [place] AND TRANSVAGINAL OB US TECHNIQUE: Both transabdominal and transvaginal ultrasound examinations were performed for complete evaluation of the gestation as well as the maternal uterus, adnexal regions, and pelvic cul-de-sac. Transvaginal  technique was performed to assess early pregnancy. COMPARISON:  None. FINDINGS: Intrauterine gestational sac: Present Yolk sac:  Present Embryo:  Absent MSD: 15.2  mm   6 w   2  d Subchorionic hemorrhage:  None visualized. Maternal uterus/adnexae: 2.5 cm cyst is noted in the left ovary likely representing the corpus luteum cyst. IMPRESSION: Gestational sac corresponding to a gestational age of [redacted] weeks 2 days. No fetal pole is noted at this time. Follow-up imaging is recommended as clinically indicated. Electronically Signed   By: Inez Catalina M.D.   On: 05/12/2016 18:00   US Ob Transvaginal  Result Date: 05/12/2016 CLINICAL DATA:  Recent spotting EXAM: OBSTETRIC <14 WK [place] AND TRANSVAGINAL OB US TECHNIQUE: Both transabdominal and transvaginal ultrasound examinations were performed for complete evaluation of the gestation as well as the maternal uterus, adnexal regions, and pelvic cul-de-sac. Transvaginal technique was performed to assess early pregnancy. COMPARISON:  None. FINDINGS: Intrauterine gestational sac: Present Yolk sac:  Present Embryo:  Absent MSD: 15.2  mm   6 w   2  d Subchorionic hemorrhage:  None visualized. Maternal uterus/adnexae: 2.5 cm cyst is noted in the left ovary likely representing the corpus luteum cyst. IMPRESSION: Gestational sac corresponding to a gestational age of [redacted] weeks 2 days. No fetal pole is noted at this time. Follow-up imaging is recommended as clinically indicated. Electronically Signed   By: Inez Catalina M.D.   On: 05/12/2016 18:00    Medications: Scheduled Meds: . sodium chloride   Intravenous Once  . docusate sodium  100 mg Oral BID  . enoxaparin (LOVENOX) injection  40 mg Subcutaneous Q24H  . folic acid  1 mg Oral Daily  . HYDROmorphone   Intravenous Q4H  . mouth rinse  15 mL Mouth Rinse BID  . multivitamin-prenatal  1 tablet Oral Daily   Continuous Infusions: . dextrose 5 % and 0.45% NaCl 100 mL/hr at 05/13/16 0623  . dextrose 5 % and 0.45% NaCl 100 mL/hr  at 05/13/16 0327   PRN Meds:.acetaminophen, diphenhydrAMINE **OR** diphenhydrAMINE (BENADRYL) IVPB(SICKLE CELL ONLY), naloxone **AND** sodium chloride flush, ondansetron (ZOFRAN) IV  Consultants:  None  Procedures:  None  Antibiotics:  None  Assessment/Plan: Active Problems:   Sickle cell anemia with crisis ([place])  1. Vaso-occlussive sickle cell pain crisis  - Will continue sickle cell protocol for pain control.  - Weight based high-dose IV hydromorphone PCA with continuous ETCO2 monitoring - Patient unable to get Ketorolac because of pregnancy - Continue IVF D5 .45% saline @ 100 cc/hr  - Continue folic acid and prenatal vitamin - Diphenhydramine, phenerganand bowel regimen  2. Sickle Cell Anemia  - Hb is 6.1 - Will transfuse  another 2 unit of PRBC on account of early pregnancy and spotting  - Monitor H and H closely - Lab in am  3. Non-pressure chronic ulcer of ankle, right, with unspecified severity ([place])  - No evidence of infection - Wound Care Consult  4. Threatened Abortion  - Strict Bed rest - Monitor closely - May need serial HCG to monitor progress  Code Status: Full Code Family Communication: N/A Disposition Plan: Not yet ready for discharge  Jamarii Banks  If 7PM-7AM, please contact night-coverage.  05/13/2016, 1:32 PM  LOS: 1 day

## 2016-05-14 DIAGNOSIS — G894 Chronic pain syndrome: Secondary | ICD-10-CM

## 2016-05-14 LAB — TYPE AND SCREEN
Blood Product Expiration Date: 201801132359
Blood Product Expiration Date: 201801132359
Blood Product Expiration Date: 201801162359
ISSUE DATE / TIME: 201712251356
ISSUE DATE / TIME: 201712241317
ISSUE DATE / TIME: 201712252139
UNIT TYPE AND RH: 5100
UNIT TYPE AND RH: 5100
Unit Type and Rh: 5100

## 2016-05-14 LAB — COMPREHENSIVE METABOLIC PANEL
ALK PHOS: 63 U/L (ref 38–126)
ALT: 44 U/L (ref 14–54)
AST: 81 U/L — AB (ref 15–41)
Albumin: 3.5 g/dL (ref 3.5–5.0)
Anion gap: 7 (ref 5–15)
BILIRUBIN TOTAL: 3.5 mg/dL — AB (ref 0.3–1.2)
BUN: 5 mg/dL — AB (ref 6–20)
CALCIUM: 8.6 mg/dL — AB (ref 8.9–10.3)
CO2: 25 mmol/L (ref 22–32)
Chloride: 102 mmol/L (ref 101–111)
Glucose, Bld: 94 mg/dL (ref 65–99)
Potassium: 3.7 mmol/L (ref 3.5–5.1)
Sodium: 134 mmol/L — ABNORMAL LOW (ref 135–145)
Total Protein: 7 g/dL (ref 6.5–8.1)

## 2016-05-14 LAB — CBC WITH DIFFERENTIAL/PLATELET
Basophils Absolute: 0 10*3/uL (ref 0.0–0.1)
Basophils Relative: 0 %
Eosinophils Absolute: 0.2 10*3/uL (ref 0.0–0.7)
Eosinophils Relative: 2 %
HEMATOCRIT: 22.7 % — AB (ref 36.0–46.0)
HEMOGLOBIN: 8.2 g/dL — AB (ref 12.0–15.0)
LYMPHS ABS: 2 10*3/uL (ref 0.7–4.0)
LYMPHS PCT: 21 %
MCH: 30.4 pg (ref 26.0–34.0)
MCHC: 36.1 g/dL — ABNORMAL HIGH (ref 30.0–36.0)
MCV: 84.1 fL (ref 78.0–100.0)
Monocytes Absolute: 1.3 10*3/uL — ABNORMAL HIGH (ref 0.1–1.0)
Monocytes Relative: 13 %
NEUTROS ABS: 6.2 10*3/uL (ref 1.7–7.7)
Neutrophils Relative %: 63 %
Platelets: 333 10*3/uL (ref 150–400)
RBC: 2.7 MIL/uL — AB (ref 3.87–5.11)
RDW: 17.5 % — ABNORMAL HIGH (ref 11.5–15.5)
WBC: 9.7 10*3/uL (ref 4.0–10.5)

## 2016-05-14 MED ORDER — HYDROMORPHONE HCL 2 MG/ML IJ SOLN
2.0000 mg | Freq: Once | INTRAMUSCULAR | Status: AC
  Administered 2016-05-14: 2 mg via SUBCUTANEOUS
  Filled 2016-05-14: qty 1

## 2016-05-14 MED ORDER — OXYCODONE HCL 10 MG PO TABS: 10.0000 mg | ORAL_TABLET | ORAL | 0 refills | Status: DC | PRN

## 2016-05-14 MED FILL — oxyCODONE HCL 10 MG TABS: 10 | 15 days supply | Qty: 90 | Fill #0

## 2016-05-14 NOTE — Consult Note (Signed)
WOC consulted for chronic sickle cell ulcerations to the right ankle, when [facility] nurse arrived patient is being DC. Will not complete consult.  Patient to follow up with primary care MD.  Marica Otter MSN, RN,CWOCN, CNS

## 2016-05-14 NOTE — Discharge Summary (Signed)
Physician Discharge Summary  [name] [id number] DOB: [birth year] DOA: 05/12/2016  PCP: [name] Dew, FNP  Admit date: 05/12/2016  Discharge date: 05/14/2016  Discharge Diagnoses:  Active Problems:   Sickle cell anemia with crisis [name])   Threatened abortion   Vaginal bleeding   Discharge Condition: Stable  Disposition:  Follow-up Information    [name] M, FNP Follow up in 3 day(s).   Specialty:  Family Medicine Contact information: [name]. [place] [id number] [phone]          Diet: Regular  Wt Readings from Last 3 Encounters:  05/14/16 56.4 kg (124 lb 5.4 oz)  04/16/16 60.3 kg (133 lb)  04/03/16 53.5 kg (118 lb)   History of present illness:  [name]  is a 45 y.o. female with history of sickle cell disease  Who presented to the Emergency Department complaining of sickle cell pain crisis. She reports 3 days of pain to the right leg, diffusely as well as the left upper arm. Pain is typical of her sickle cell pain crisis. She is taking oxycodone 10 mg, every 4 hours at home with no change in her symptoms. She has chronic wounds to both feet with ongoing drainage, doing daily dressing. These are unchanged from her baseline. She is currently [redacted] weeks pregnant. She denies any abdominal pain, vaginal bleeding, nausea, vomiting. Her baseline Hb is usually around 7.   ED Course: Patient's vitals: BP 127/74 (BP Location: Left Arm)  Pulse 71  Temp 98.2 F (36.8 C) (Oral)  Resp 18  LMP 03/19/2016  SpO2 93%. Patient was treated with standard sickle cell pain crisis control. Her pain remained uncontrolled so I was called to admit patient for Sickle Cell Pain Crisis.  Hospital Course:  Patient was treated with standard sickle cell pain protocol, her pain got improved. However, patient started spotting on admission, she became very anxious that she may be losing her 7 weeks pregnancy. Transvaginal Ultrasound showed "Gestational  sac corresponding to a gestational age of [redacted] weeks 2 days. No fetal pole is noted at this time. Follow-up imaging is recommended as clinically indicated". She has appointment for her first OB visit coming up. She also has chronic leg ulcers in both ankle joints, she has been to wound clinic, has another appointment for possible debridement tomorrow. Her Hb came down to 6.1 on admission, due to her vaginal bleeding and leg ulcers, she was transfused with initially one units and then another 2 units of PRBC. Hb was 8.2 at the time of discharge. She Had no fever. She was discharged in a hemodynamically stable condition. She will follow up with [facility] on Friday 05/17/2016, follow up with wound clinic tomorrow at 3 pm. She was given prescription for 90 (ninety) tablets of Oxycodone for pain control at home.  Discharge Exam: Vitals:   05/14/16 1051 05/14/16 1457  BP:  111/68  Pulse:  71  Resp: 16 16  Temp:  97.7 F (36.5 C)   Vitals:   05/14/16 1018 05/14/16 1046 05/14/16 1051 05/14/16 1457  BP: (!) 111/57   111/68  Pulse: 75   71  Resp: 16 18 16 16   Temp:    97.7 F (36.5 C)  TempSrc:    Oral  SpO2: 100% 98% 99% 96%  Weight:      Height:       General appearance : Awake, alert, not in any distress. Speech Clear. Not toxic looking HEENT: Atraumatic and Normocephalic, pupils equally  reactive to light and accomodation Neck: Supple, no JVD. No cervical lymphadenopathy.  Chest: Good air entry bilaterally, no added sounds  CVS: S1 S2 regular, no murmurs.  Abdomen: Bowel sounds present, Non tender and not distended with no gaurding, rigidity or rebound. Extremities: B/L Lower Ext wound dressing clean, both legs are warm to touch Neurology: Awake alert, and oriented X 3, CN II-XII intact, Non focal Skin: Bilateral lower leg ulcers at the medical malleoli.   Discharge Instructions   Allergies as of 05/14/2016   No Known Allergies     Medication List    STOP taking these  medications   ibuprofen 600 MG tablet Commonly known as:  ADVIL,MOTRIN     TAKE these medications   acetaminophen 500 MG tablet Commonly known as:  TYLENOL Take 500 mg by mouth every 4 (four) hours as needed for moderate pain.   folic acid 1 MG tablet Commonly known as:  FOLVITE Take 1 tablet (1 mg total) by mouth daily.   ketoconazole 2 % cream Commonly known as:  NIZORAL Apply 1 application topically daily.   Oxycodone HCl 10 MG Tabs Take 1 tablet (10 mg total) by mouth every 4 (four) hours as needed (pain).   PRENATAL PO Take 1 tablet by mouth daily.   Vitamin D 2000 units Caps Take 1 capsule (2,000 Units total) by mouth daily.      The results of significant diagnostics from this hospitalization (including imaging, microbiology, ancillary and laboratory) are listed below for reference.    Significant Diagnostic Studies: Dg Tibia/fibula Left  Result Date: 05/01/2016 CLINICAL DATA:  Nonhealing wounds, sickle cell disease EXAM: LEFT TIBIA AND FIBULA - 2 VIEW COMPARISON:  None. FINDINGS: No radiographic evidence of osteomyelitis is seen. No periosteal reaction is noted. A sclerotic lesion within the distal left [name] is typical of bone infarct in this patient with sickle cell disease. IMPRESSION: No radiographic evidence of osteomyelitis. Electronically Signed   By: Ivar Drape [name].D.   On: 05/01/2016 10:35   Dg Tibia/fibula Right  Result Date: 05/01/2016 CLINICAL DATA:  Nonhealing wounds on lower leg EXAM: RIGHT TIBIA AND FIBULA - 2 VIEW COMPARISON:  09/27/2015 FINDINGS: No acute bony abnormality. No fracture, subluxation or dislocation. No radiographic changes of osteomyelitis. IMPRESSION: No acute bony abnormality. Electronically Signed   By: Rolm Baptise [name].D.   On: 05/01/2016 10:35   US Ob Comp Less 14 Wks  Result Date: 05/12/2016 CLINICAL DATA:  Recent spotting EXAM: OBSTETRIC <14 WK [place] AND TRANSVAGINAL OB US TECHNIQUE: Both transabdominal and transvaginal ultrasound  examinations were performed for complete evaluation of the gestation as well as the maternal uterus, adnexal regions, and pelvic cul-de-sac. Transvaginal technique was performed to assess early pregnancy. COMPARISON:  None. FINDINGS: Intrauterine gestational sac: Present Yolk sac:  Present Embryo:  Absent MSD: 15.2  mm   6 w   2  d Subchorionic hemorrhage:  None visualized. Maternal uterus/adnexae: 2.5 cm cyst is noted in the left ovary likely representing the corpus luteum cyst. IMPRESSION: Gestational sac corresponding to a gestational age of [redacted] weeks 2 days. No fetal pole is noted at this time. Follow-up imaging is recommended as clinically indicated. Electronically Signed   By: Inez Catalina [name].D.   On: 05/12/2016 18:00   US Ob Transvaginal  Result Date: 05/12/2016 CLINICAL DATA:  Recent spotting EXAM: OBSTETRIC <14 WK [place] AND TRANSVAGINAL OB US TECHNIQUE: Both transabdominal and transvaginal ultrasound examinations were performed for complete evaluation of the gestation as well as  the maternal uterus, adnexal regions, and pelvic cul-de-sac. Transvaginal technique was performed to assess early pregnancy. COMPARISON:  None. FINDINGS: Intrauterine gestational sac: Present Yolk sac:  Present Embryo:  Absent MSD: 15.2  mm   6 w   2  d Subchorionic hemorrhage:  None visualized. Maternal uterus/adnexae: 2.5 cm cyst is noted in the left ovary likely representing the corpus luteum cyst. IMPRESSION: Gestational sac corresponding to a gestational age of [redacted] weeks 2 days. No fetal pole is noted at this time. Follow-up imaging is recommended as clinically indicated. Electronically Signed   By: Inez Catalina [name].D.   On: 05/12/2016 18:00    Microbiology: No results found for this or any previous visit (from the past 240 hour(s)).   Labs: Basic Metabolic Panel:  Recent Labs Lab 05/12/16 0350 05/13/16 0408 05/14/16 0405  NA 134* 134* 134*  K 3.2* 3.1* 3.7  CL 102 102 102  CO2 24 27 25   GLUCOSE 100* 98 94  BUN 7  <5* 5*  CREATININE 0.35* <0.30* <0.30*  CALCIUM 8.9 8.3* 8.6*   Liver Function Tests:  Recent Labs Lab 05/12/16 0350 05/13/16 0408 05/14/16 0405  AST 54* 40 81*  ALT 33 25 44  ALKPHOS 59 39 63  BILITOT 4.3* 4.2* 3.5*  PROT 8.8* 7.1 7.0  ALBUMIN 4.6 3.7 3.5   No results for input(s): LIPASE, AMYLASE in the last 168 hours. No results for input(s): AMMONIA in the last 168 hours. CBC:  Recent Labs Lab 05/12/16 0350 05/13/16 0408 05/14/16 0405  WBC 12.8* 10.0 9.7  NEUTROABS 8.1*  --  6.2  HGB 6.3* 6.1* 8.2*  HCT 17.1* 16.7* 22.7*  MCV 81.0 81.9 84.1  PLT 395 321 333   Cardiac Enzymes: No results for input(s): CKTOTAL, CKMB, CKMBINDEX, TROPONINI in the last 168 hours. BNP: Invalid input(s): POCBNP CBG: No results for input(s): GLUCAP in the last 168 hours.  Time coordinating discharge: 50 minutes  Signed:  Victorino Fatzinger, [place] [facility] 05/14/2016, 5:20 PM

## 2016-05-14 NOTE — Discharge Instructions (Signed)
Threatened Miscarriage A threatened miscarriage is when you have vaginal bleeding during your first 20 weeks of pregnancy but the pregnancy has not ended. Your doctor will do tests to make sure you are still pregnant. The cause of the bleeding may not be known. This condition does not mean your pregnancy will end. It does increase the risk of it ending (complete miscarriage). Follow these instructions at home:  Make sure you keep all your doctor visits for prenatal care.  Get plenty of rest.  Do not have sex or use tampons if you have vaginal bleeding.  Do not douche.  Do not smoke or use drugs.  Do not drink alcohol.  Avoid caffeine. Contact a doctor if:  You have light bleeding from your vagina.  You have belly pain or cramping.  You have a fever. Get help right away if:  You have heavy bleeding from your vagina.  You have clots of blood coming from your vagina.  You have bad pain or cramps in your low back or belly.  You have fever, chills, and bad belly pain. This information is not intended to replace advice given to you by your health care provider. Make sure you discuss any questions you have with your health care provider. Document Released: 04/18/2008 Document Revised: 10/12/2015 Document Reviewed: 03/02/2013 Elsevier Interactive Patient Education  2017 [employer] Sickle Cell Anemia, Adult Sickle cell anemia is a condition in which red blood cells have an abnormal sickle shape. This abnormal shape shortens the cells life span, which results in a lower than normal concentration of red blood cells in the blood. The sickle shape also causes the cells to clump together and block free blood flow through the blood vessels. As a result, the tissues and organs of the body do not receive enough oxygen. Sickle cell anemia causes organ damage and pain and increases the risk of infection. What are the causes? Sickle cell anemia is a genetic disorder. Those who receive two  copies of the gene have the condition, and those who receive one copy have the trait. What increases the risk? The sickle cell gene is most common in people whose families originated in [place]. Other areas of the globe where sickle cell trait occurs include the [place], [place]. What are the signs or symptoms?  Pain, especially in the extremities, back, chest, or abdomen (common). The pain may start suddenly or may develop following an illness, especially if there is dehydration. Pain can also occur due to overexertion or exposure to extreme temperature changes.  Frequent severe bacterial infections, especially certain types of pneumonia and meningitis.  Pain and swelling in the hands and feet.  Decreased activity.  Loss of appetite.  Change in behavior.  Headaches.  Seizures.  Shortness of breath or difficulty breathing.  Vision changes.  Skin ulcers. Those with the trait may not have symptoms or they may have mild symptoms. How is this diagnosed? Sickle cell anemia is diagnosed with blood tests that demonstrate the genetic trait. It is often diagnosed during the newborn period, due to mandatory testing nationwide. A variety of blood tests, X-rays, CT scans, MRI scans, ultrasounds, and lung function tests may also be done to monitor the condition. How is this treated? Sickle cell anemia may be treated with:  Medicines. You may be given pain medicines, antibiotic medicines (to treat and prevent infections) or medicines to increase the production of certain types of hemoglobin.  Fluids.  Oxygen.  Blood transfusions. Follow these instructions at home:  Drink enough fluid to keep your urine clear or pale yellow. Increase your fluid intake in hot weather and during exercise.  Do not smoke. Smoking lowers oxygen levels in the blood.  Only take over-the-counter or prescription medicines for pain, fever, or discomfort  as directed by your health care provider.  Take antibiotics as directed by your health care provider. Make sure you finish them it even if you start to feel better.  Take supplements as directed by your health care provider.  Consider wearing a medical alert bracelet. This tells anyone caring for you in an emergency of your condition.  When traveling, keep your medical information, health care provider's names, and the medicines you take with you at all times.  If you develop a fever, do not take medicines to reduce the fever right away. This could cover up a problem that is developing. Notify your health care provider.  Keep all follow-up appointments with your health care provider. Sickle cell anemia requires regular medical care. Contact a health care provider if: You have a fever. Get help right away if:  You feel dizzy or faint.  You have new abdominal pain, especially on the left side near the stomach area.  You develop a persistent, often uncomfortable and painful penile erection (priapism). If this is not treated immediately it will lead to impotence.  You have numbness your arms or legs or you have a hard time moving them.  You have a hard time with speech.  You have a fever or persistent symptoms for more than 2-3 days.  You have a fever and your symptoms suddenly get worse.  You have signs or symptoms of infection. These include:  Chills.  Abnormal tiredness (lethargy).  Irritability.  Poor eating.  Vomiting.  You develop pain that is not helped with medicine.  You develop shortness of breath.  You have pain in your chest.  You are coughing up pus-like or bloody sputum.  You develop a stiff neck.  Your feet or hands swell or have pain.  Your abdomen appears bloated.  You develop joint pain. This information is not intended to replace advice given to you by your health care provider. Make sure you discuss any questions you have with your health care  provider. Document Released: 08/14/2005 Document Revised: 11/24/2015 Document Reviewed: 12/16/2012 Elsevier Interactive Patient Education  2017 [employer].

## 2016-05-14 NOTE — Progress Notes (Signed)
Assessment unchanged.  Scripts were given per MD order.  All questions pertaining to D/C we answered.  Pt was D/C'd via wheelchair and accompanied by NT.  Pt's pain back under control after administration of subq Dilaudid. Drsg changed prior to D/C.  Roselind Rily

## 2016-05-15 ENCOUNTER — Inpatient Hospital Stay (HOSPITAL_COMMUNITY)
Admission: AD | Admit: 2016-05-15 | Discharge: 2016-05-15 | Disposition: A | Discharge status: Home / Self Care | Payer: Medicaid Other | Source: Ambulatory Visit | Attending: Family Medicine | Admitting: Family Medicine

## 2016-05-15 ENCOUNTER — Inpatient Hospital Stay (HOSPITAL_COMMUNITY): Payer: Medicaid Other

## 2016-05-15 ENCOUNTER — Encounter (HOSPITAL_COMMUNITY): Payer: Self-pay | Admitting: *Deleted

## 2016-05-15 DIAGNOSIS — L97329 Non-pressure chronic ulcer of left ankle with unspecified severity: Secondary | ICD-10-CM | POA: Insufficient documentation

## 2016-05-15 DIAGNOSIS — D5700 Hb-SS disease with crisis, unspecified: Secondary | ICD-10-CM | POA: Diagnosis not present

## 2016-05-15 DIAGNOSIS — L97319 Non-pressure chronic ulcer of right ankle with unspecified severity: Secondary | ICD-10-CM | POA: Insufficient documentation

## 2016-05-15 DIAGNOSIS — O200 Threatened abortion: Secondary | ICD-10-CM | POA: Diagnosis not present

## 2016-05-15 DIAGNOSIS — O3481 Maternal care for other abnormalities of pelvic organs, first trimester: Secondary | ICD-10-CM | POA: Insufficient documentation

## 2016-05-15 DIAGNOSIS — M25572 Pain in left ankle and joints of left foot: Secondary | ICD-10-CM | POA: Diagnosis not present

## 2016-05-15 DIAGNOSIS — M25571 Pain in right ankle and joints of right foot: Secondary | ICD-10-CM | POA: Diagnosis not present

## 2016-05-15 DIAGNOSIS — O26891 Other specified pregnancy related conditions, first trimester: Secondary | ICD-10-CM | POA: Diagnosis not present

## 2016-05-15 DIAGNOSIS — Z3A08 8 weeks gestation of pregnancy: Secondary | ICD-10-CM | POA: Diagnosis not present

## 2016-05-15 DIAGNOSIS — O209 Hemorrhage in early pregnancy, unspecified: Secondary | ICD-10-CM

## 2016-05-15 DIAGNOSIS — N939 Abnormal uterine and vaginal bleeding, unspecified: Secondary | ICD-10-CM | POA: Diagnosis present

## 2016-05-15 HISTORY — DX: Abnormal results of liver function studies: R94.5

## 2016-05-15 HISTORY — DX: Other specified abnormal findings of blood chemistry: R79.89

## 2016-05-15 LAB — HCG, QUANTITATIVE, PREGNANCY: hCG, Beta Chain, Quant, S: 7502 m[IU]/mL — ABNORMAL HIGH (ref <5)

## 2016-05-15 LAB — HEMOGLOBINOPATHY EVALUATION
HGB A2 QUANT: 5.3 % — AB (ref 1.8–3.2)
HGB C: 0 %
HGB S QUANTITAION: 87.6 % — AB
Hgb A: 0 % — ABNORMAL LOW (ref 96.4–98.8)
Hgb F Quant: 7.1 % — ABNORMAL HIGH (ref 0.0–2.0)

## 2016-05-15 NOTE — MAU Note (Signed)
Was at [facility]. Started bleeding on Sunday.  Had [place], baby was still there, but the bleeding continues. having some pain in RLQ, started today. No bm since Sat

## 2016-05-15 NOTE — MAU Provider Note (Signed)
History     CSN: [id number]  Arrival date and time: 05/15/16 1624   None     Chief Complaint  Patient presents with  . Vaginal Bleeding   HPI 45 yo [id number] at 8w by LMP presenting today for the evaluation of vaginal bleeding. Patient was recently admitted to [facility] on 12/24 secondary to sickle cell crisis. She was treated with blood transfusion and pain management. She reports onset of vaginal bleeding in the evening of 12/24. She reports some cramping pain with passage of grape-size clots. Patient describes the bleeding as menses-like flow. Patient with ultrasound showing IUGS without fetal pole on 12/24. Patient reports improvement in her sickle cell pain. She reports bilateral ankle pain secondary to ulcers which have been present for the past year. She is under the care of wound service. Patient is without any other complaints    Past Medical History:  Diagnosis Date  . Anemia 03/30/2012   Hx of sickle cell disease  . Anemia complicating pregnancy 123456  . CAP (community acquired pneumonia)    2014  . Cholelithiasis y-1  . Cholelithiasis   . Elevated LFTs   . Leg ulcer ([place]) 10/27/2012   Chronic under care of wound clinic  . Sickle cell disease ([place])     Past Surgical History:  Procedure Laterality Date  . ALLOGRAFT APPLICATION Right [id number]   Procedure: SURGICAL PREP FOR GRAFTING RIGHT LOWER EXTREMITY AND APPLICATION OF Jannifer Hick;  Surgeon: Irene Limbo, MD;  Location: [place];  Service: Plastics;  Laterality: Right;  . APPLICATION OF A-CELL OF EXTREMITY Right 10/09/2015   Procedure: APPLICATION OF Jannifer Hick;  Surgeon: Irene Limbo, MD;  Location: [place];  Service: Plastics;  Laterality: Right;  . CESAREAN SECTION    . CESAREAN SECTION N/A 01/06/2014   Procedure: CESAREAN SECTION;  Surgeon: Emily Filbert, MD;  Location: [facility];  Service: Obstetrics;  Laterality: N/A;  . I&D EXTREMITY Right 10/09/2015   Procedure: SURGICAL  PREPARATION FOR GRAFTING RIGHT ANKLE AND APPLICATION THERASKIN;  Surgeon: Irene Limbo, MD;  Location: [place];  Service: Plastics;  Laterality: Right;    Family History  Problem Relation Age of Onset  . Sickle cell anemia Sister   . Diabetes Mother     Social History  Substance Use Topics  . Smoking status: Never Smoker  . Smokeless tobacco: Never Used  . Alcohol use No    Allergies: No Known Allergies  Prescriptions Prior to Admission  Medication Sig Dispense Refill Last Dose  . acetaminophen (TYLENOL) 500 MG tablet Take 500 mg by mouth every 4 (four) hours as needed for moderate pain.   Past Week at Unknown time  . folic acid (FOLVITE) 1 MG tablet Take 1 tablet (1 mg total) by mouth daily. 30 tablet 11 05/14/2016 at Unknown time  . ketoconazole (NIZORAL) 2 % cream Apply 1 application topically daily. 60 g 2 05/14/2016 at Unknown time  . Oxycodone HCl 10 MG TABS Take 1 tablet (10 mg total) by mouth every 4 (four) hours as needed (pain). 90 tablet 0 05/15/2016 at Unknown time  . Prenatal Vit-Fe Fumarate-FA (PRENATAL PO) Take 1 tablet by mouth daily.   05/15/2016 at Unknown time  . Cholecalciferol (VITAMIN D) 2000 UNITS CAPS Take 1 capsule (2,000 Units total) by mouth daily. (Patient not taking: Reported on 05/15/2016) 30 capsule 11 Not Taking at Unknown time    ROS  See pertinent in HPI Physical Exam   Blood pressure 114/65, pulse  72, temperature 98.3 F (36.8 C), temperature source Oral, resp. rate 16, weight 116 lb 9.6 oz (52.9 kg), last menstrual period 03/19/2016.  Physical Exam GENERAL: Well-developed, well-nourished female in no acute distress.  ABDOMEN: Soft, nontender, nondistended. No organomegaly. PELVIC: Normal external female genitalia. Vagina is pink and rugated.  Normal discharge. Normal appearing cervix. Uterus is 8-weeks in size. No adnexal mass or tenderness. Cervix is closed EXTREMITIES: No cyanosis, clubbing, or edema. Ankles wrapped in  gauze bilaterally  MAU Course  Procedures  MDM 05/15/16 ultrasound  FINDINGS: Intrauterine gestational sac: Single  Yolk sac:  Visualized  Embryo:  Not visualized  Cardiac Activity: Not visualized  Heart Rate:  bpm  MSD: 17  mm   6 w   3  d  CRL:     mm    w  d                  [place] EDC:  Subchorionic hemorrhage:  Small subchorionic hemorrhage.  Maternal uterus/adnexae: Simple appearing cyst in the right ovary measures up to 2.7 cm and seems to be the area of pain. Trace free fluid in the pelvis.  IMPRESSION: Intrauterine gestational sac with yolk sac present, but no fetal pole. Given the mean sac diameter of 17 mm, findings are suspicious but not yet definitive for failed pregnancy. Recommend follow-up US in 10-14 days for definitive diagnosis. This recommendation follows SRU consensus guidelines: Diagnostic Criteria for Nonviable Pregnancy Early in the First Trimester. [facility] [place] Med [id number].  Small simple appearing cyst in the right ovary.   Electronically Signed   By: Rolm Baptise M.D.   On: 05/15/2016 18:08   Assessment and Plan  45 yo at 8 weeks by LMP with threatened abortion - Results of ultrasound reviewed with the patient - Results of decreasing quant HCG also discussed - Patient desires follow up ultrasound to confirm failed pregnancy. Follow up will be scheduled - precautions reviewed with the patient Julene Rahn 05/15/2016, 5:23 PM

## 2016-05-15 NOTE — Discharge Instructions (Signed)
Threatened Miscarriage °A threatened miscarriage is when you have vaginal bleeding during your first 20 weeks of pregnancy but the pregnancy has not ended. Your doctor will do tests to make sure you are still pregnant. The cause of the bleeding may not be known. This condition does not mean your pregnancy will end. It does increase the risk of it ending (complete miscarriage). °Follow these instructions at home: °· Make sure you keep all your doctor visits for prenatal care. °· Get plenty of rest. °· Do not have sex or use tampons if you have vaginal bleeding. °· Do not douche. °· Do not smoke or use drugs. °· Do not drink alcohol. °· Avoid caffeine. °Contact a doctor if: °· You have light bleeding from your vagina. °· You have belly pain or cramping. °· You have a fever. °Get help right away if: °· You have heavy bleeding from your vagina. °· You have clots of blood coming from your vagina. °· You have bad pain or cramps in your low back or belly. °· You have fever, chills, and bad belly pain. °This information is not intended to replace advice given to you by your health care provider. Make sure you discuss any questions you have with your health care provider. °Document Released: 04/18/2008 Document Revised: 10/12/2015 Document Reviewed: 03/02/2013 °Elsevier Interactive Patient Education © 2017 Elsevier Inc. ° °

## 2016-05-17 ENCOUNTER — Encounter: Payer: Self-pay | Admitting: Family Medicine

## 2016-05-17 ENCOUNTER — Ambulatory Visit (INDEPENDENT_AMBULATORY_CARE_PROVIDER_SITE_OTHER): Payer: Medicaid Other | Admitting: Family Medicine

## 2016-05-17 VITALS — BP 122/67 | HR 88 | Temp 97.8°F | Resp 16 | Ht 67.0 in | Wt 116.0 lb

## 2016-05-17 DIAGNOSIS — Z3491 Encounter for supervision of normal pregnancy, unspecified, first trimester: Secondary | ICD-10-CM

## 2016-05-17 DIAGNOSIS — D571 Sickle-cell disease 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: Secondary | ICD-10-CM | POA: Diagnosis not present

## 2016-05-17 DIAGNOSIS — Z3490 Encounter for supervision of normal pregnancy, unspecified, unspecified trimester: Secondary | ICD-10-CM | POA: Diagnosis not present

## 2016-05-17 NOTE — Progress Notes (Signed)
Subjective:    Patient ID: [name], female    DOB: [birth year], 45 y.o.   MRN: [id number]  HPI [name], a 45 year old female with a history of sickle cell anemia, HbSS presents for a post hospital follow-up.  Patient reports that she has severe pain to bilateral lower extremities. She maintains that pain intensity is 10/10. She last had Oxycodone several hours ago with minimal relief. [name] is currently [redacted] weeks pregnant. She maintains that she has had vaginal bleeding. No fetal pole was visualized. Obstetrics recommends repeating ultrasound in 10-14 days.    Patient also has right lower extremity non pressure ulcer. She received a skin grafting on 10/09/2015 and was evaluated by plastic surgery on 05/15/2017. Will not repeat skin grafting at this time due to current pregnancy.  Interventions to date has included debridement and antibiotics. She denies headache, chest pain, shortness of breath, nausea, vomiting diarrhea, or constipation.    Past Medical History:  Diagnosis Date  . Anemia 03/30/2012   Hx of sickle cell disease  . Anemia complicating pregnancy 123456  . CAP (community acquired pneumonia)    2014  . Cholelithiasis y-1  . Cholelithiasis   . Elevated LFTs   . Leg ulcer ([place]) 10/27/2012   Chronic under care of wound clinic  . Sickle cell disease ([place])    Immunization History  Administered Date(s) Administered  . Influenza,inj,Quad PF,36+ Mos 03/16/2013, 01/20/2014, 03/01/2015, 02/19/2016  . Pneumococcal Conjugate-13 12/14/2015  . Pneumococcal Polysaccharide-23 03/24/2014  . Tdap 11/25/2013   Social History   Social History  . Marital status: Married    Spouse name: [name]  . Number of children: 1  . Years of education: N/A   Occupational History  . Employed in home care.    Social History Main Topics  . Smoking status: Never Smoker  . Smokeless tobacco: Never Used  . Alcohol use No  . Drug use: No  . Sexual activity: Not  Currently    Birth control/ protection: None   Other Topics Concern  . Not on file   Social History Narrative   Lives with husband.  No Known Allergies  Review of Systems  Constitutional: Positive for fatigue. Negative for fever.  HENT: Negative.   Eyes: Negative for visual disturbance.  Respiratory: Negative.   Cardiovascular: Negative.   Gastrointestinal: Negative.   Endocrine: Negative.  Negative for polydipsia, polyphagia and polyuria.  Musculoskeletal: Positive for myalgias.       Pain to right lower extremity  Skin: Negative.        Right lower extremity ulcers  Neurological: Negative.   Hematological: Negative.   Psychiatric/Behavioral: Negative.        Objective:   Physical Exam  Constitutional: She is oriented to person, place, and time. She appears well-nourished. She appears cachectic.  HENT:  Head: Normocephalic and atraumatic.  Right Ear: External ear normal.  Left Ear: External ear normal.  Nose: Nose normal.  Mouth/Throat: Oropharynx is clear and moist.  Eyes: Conjunctivae and EOM are normal. Pupils are equal, round, and reactive to light.  Neck: Normal range of motion. Neck supple.  Cardiovascular: Normal rate, normal heart sounds and intact distal pulses.   Pulmonary/Chest: Effort normal and breath sounds normal.  Abdominal: Soft. Bowel sounds are normal.  Musculoskeletal: Normal range of motion.  Neurological: She is alert and oriented to person, place, and time. She has normal reflexes.  Skin: Skin is warm and dry.  Bilateral lower extremity ulcers, unable  to examine due to dressings.    Psychiatric: She has a normal mood and affect. Her behavior is normal. Judgment and thought content normal.      BP 122/67 (BP Location: Right Arm, Patient Position: Sitting, Cuff Size: Normal)   Pulse 88   Temp 97.8 F (36.6 C) (Oral)   Resp 16   Ht 5\' 7"  ([id number] m)   Wt 116 lb (52.6 kg)   LMP 03/19/2016 Comment: patient double shielded  SpO2 100%   BMI  18.17 kg/m      Assessment & Plan:  1. Hb-SS disease 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 ([place]) Acute and chronic painful episodes -  Will continue Oxycodone every 4 hours as needed for moderate to severe chronic pain. [name] has been having increased pain since stasis ulcers have resurfaced.  We will discuss titration of current dosage during next office visit.   We discussed that pt is to receive her Schedule II prescriptions only from [place]. Pt is also aware that the prescription history is available to [place] online through the [facility] CSRS. Controlled substance agreement signed previously. We reminded [name] that all patients receiving Schedule II narcotics must be seen for follow within one month of prescription being requested. We reviewed the terms of our pain agreement, including the need to keep medicines in a safe locked location away from children or pets, and the need to report excess sedation or constipation, measures to avoid constipation, and policies related to early refills and stolen prescriptions. According to the [place] Chronic Pain Initiative program, we have reviewed details related to analgesia, adverse effects, aberrant behaviors. Reviewed [place] Substance Reporting system prior to prescribing opiate medications.   - CBC with Differential; Future - Hemoglobinopathy evaluation; Future  2. First trimester pregnancy Patient to follow up with OB as previously scheduled. Reviewed labs, HCG decreasing, will repeat beta HCG.  Patient continues to have light bleeding and small clots.  - hCG, quantitative, pregnancy   RTC: 1 month for sickle cell anemia and chronic pain management  The patient was given clear instructions to go to ER or return to medical center if symptoms do not improve, worsen or new problems develop. The patient verbalized understanding. Will notify patient with laboratory results.   [name], FNP

## 2016-05-17 NOTE — Patient Instructions (Addendum)
Sickle Cell Anemia, Adult °Sickle cell anemia is a condition where your red blood cells are shaped like sickles. Red blood cells carry oxygen through the body. Sickle-shaped red blood cells do not live as long as normal red blood cells. They also clump together and block blood from flowing through the blood vessels. These things prevent the body from getting enough oxygen. Sickle cell anemia causes organ damage and pain. It also increases the risk of infection. °Follow these instructions at home: °· Drink enough fluid to keep your pee (urine) clear or pale yellow. Drink more in hot weather and during exercise. °· Do not smoke. Smoking lowers oxygen levels in the blood. °· Only take over-the-counter or prescription medicines as told by your doctor. °· Take antibiotic medicines as told by your doctor. Make sure you finish them even if you start to feel better. °· Take supplements as told by your doctor. °· Consider wearing a medical alert bracelet. This tells anyone caring for you in an emergency of your condition. °· When traveling, keep your medical information, doctors' names, and the medicines you take with you at all times. °· If you have a fever, do not take fever medicines right away. This could cover up a problem. Tell your doctor. °· Keep all follow-up visits with your doctor. Sickle cell anemia requires regular medical care. °Contact a doctor if: °You have a fever. °Get help right away if: °· You feel dizzy or faint. °· You have new belly (abdominal) pain, especially on the left side near the stomach area. °· You have a lasting, often uncomfortable and painful erection of the penis (priapism). If it is not treated right away, you will become unable to have sex (impotence). °· You have numbness in your arms or legs or you have a hard time moving them. °· You have a hard time talking. °· You have a fever or lasting symptoms for more than 2-3 days. °· You have a fever and your symptoms suddenly get  worse. °· You have signs or symptoms of infection. These include: °? Chills. °? Being more tired than normal (lethargy). °? Irritability. °? Poor eating. °? Throwing up (vomiting). °· You have pain that is not helped with medicine. °· You have shortness of breath. °· You have pain in your chest. °· You are coughing up pus-like or bloody mucus. °· You have a stiff neck. °· Your feet or hands swell or have pain. °· Your belly looks bloated. °· Your joints hurt. °This information is not intended to replace advice given to you by your health care provider. Make sure you discuss any questions you have with your health care provider. °Document Released: 02/24/2013 Document Revised: 10/12/2015 Document Reviewed: 12/16/2012 °Elsevier Interactive Patient Education © 2017 Elsevier Inc. ° °

## 2016-05-18 LAB — HCG, QUANTITATIVE, PREGNANCY: hCG, Beta Chain, Quant, S: 4061.3 m[IU]/mL — ABNORMAL HIGH

## 2016-05-21 ENCOUNTER — Other Ambulatory Visit: Payer: Self-pay | Admitting: Family Medicine

## 2016-05-21 DIAGNOSIS — D571 Sickle-cell disease 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: Secondary | ICD-10-CM

## 2016-05-21 DIAGNOSIS — O209 Hemorrhage in early pregnancy, unspecified: Secondary | ICD-10-CM

## 2016-05-21 NOTE — Progress Notes (Signed)
Reviewed beta HCG, rapidly decreasing. Will send referral to OB/gyn and schedule transvaginal ultrasound. Patient continues to have light vaginal bleeding with occasional clots.    [name], FNP

## 2016-05-23 ENCOUNTER — Ambulatory Visit (HOSPITAL_COMMUNITY)
Admission: RE | Admit: 2016-05-23 | Discharge: 2016-05-23 | Disposition: A | Discharge status: Home / Self Care | Payer: Medicaid Other | Source: Ambulatory Visit | Attending: Family Medicine | Admitting: Family Medicine

## 2016-05-23 DIAGNOSIS — O039 Complete or unspecified spontaneous abortion without complication: Secondary | ICD-10-CM | POA: Diagnosis not present

## 2016-05-23 DIAGNOSIS — O209 Hemorrhage in early pregnancy, unspecified: Secondary | ICD-10-CM | POA: Diagnosis present

## 2016-05-23 MED FILL — MUPIROCIN 2% OINTMENT: 2 | 15 days supply | Qty: 66 | Fill #3

## 2016-05-29 ENCOUNTER — Encounter: Payer: Medicaid Other | Admitting: Obstetrics and Gynecology

## 2016-05-31 ENCOUNTER — Encounter: Payer: Self-pay | Admitting: Family Medicine

## 2016-05-31 ENCOUNTER — Ambulatory Visit (INDEPENDENT_AMBULATORY_CARE_PROVIDER_SITE_OTHER): Payer: Medicaid Other | Admitting: Family Medicine

## 2016-05-31 VITALS — BP 111/65 | HR 82 | Temp 98.0°F | Resp 14 | Ht 67.0 in | Wt 120.0 lb

## 2016-05-31 DIAGNOSIS — L97919 Non-pressure chronic ulcer of unspecified part of right lower leg with unspecified severity: Secondary | ICD-10-CM

## 2016-05-31 DIAGNOSIS — D571 Sickle-cell disease 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: Secondary | ICD-10-CM | POA: Diagnosis not present

## 2016-05-31 DIAGNOSIS — G894 Chronic pain syndrome: Secondary | ICD-10-CM

## 2016-05-31 DIAGNOSIS — E559 Vitamin D deficiency, unspecified: Secondary | ICD-10-CM

## 2016-05-31 DIAGNOSIS — L97929 Non-pressure chronic ulcer of unspecified part of left lower leg with unspecified severity: Secondary | ICD-10-CM

## 2016-05-31 LAB — CBC WITH DIFFERENTIAL/PLATELET
BASOS PCT: 2 %
Basophils Absolute: 256 cells/uL — ABNORMAL HIGH (ref 0–200)
EOS PCT: 7 %
Eosinophils Absolute: 896 cells/uL — ABNORMAL HIGH (ref 15–500)
HCT: 24.9 % — ABNORMAL LOW (ref 35.0–45.0)
HEMOGLOBIN: 8.1 g/dL — AB (ref 11.7–15.5)
LYMPHS ABS: 2688 {cells}/uL (ref 850–3900)
Lymphocytes Relative: 21 %
MCH: 28.7 pg (ref 27.0–33.0)
MCHC: 32.5 g/dL (ref 32.0–36.0)
MCV: 88.3 fL (ref 80.0–100.0)
MPV: 9.6 fL (ref 7.5–12.5)
Monocytes Absolute: 1408 cells/uL — ABNORMAL HIGH (ref 200–950)
Monocytes Relative: 11 %
NEUTROS PCT: 59 %
Neutro Abs: 7552 cells/uL (ref 1500–7800)
Platelets: 685 10*3/uL — ABNORMAL HIGH (ref 140–400)
RBC: 2.82 MIL/uL — AB (ref 3.80–5.10)
RDW: 18.4 % — AB (ref 11.0–15.0)
WBC: 12.8 10*3/uL — AB (ref 3.8–10.8)

## 2016-05-31 MED ORDER — IBUPROFEN 600 MG PO TABS: 600.0000 mg | ORAL_TABLET | Freq: Three times a day (TID) | ORAL | 0 refills | Status: DC | PRN

## 2016-05-31 MED ORDER — OXYCODONE HCL 10 MG PO TABS: 10.0000 mg | ORAL_TABLET | ORAL | 0 refills | Status: DC | PRN

## 2016-05-31 MED FILL — hydrOXYzine HCL 10 MG TABS: 10 | 7 days supply | Qty: 30 | Fill #1

## 2016-05-31 MED FILL — IBUPROFEN 600 MG TABLET: 600 | 10 days supply | Qty: 30 | Fill #0

## 2016-05-31 MED FILL — oxyCODONE HCL 10 MG TABS: 10 | 15 days supply | Qty: 90 | Fill #0

## 2016-05-31 NOTE — Progress Notes (Signed)
Subjective:    Patient ID: [name], female    DOB: 17-Feb-1971, 46 y.o.   MRN: [id number]  HPI Ms. Sahithi Bowermaster, a 46 year old female with a history of sickle cell anemia, HbSS presents for a 1 month follow-up.  Patient reports that she has severe pain to bilateral lower extremities. Patient also has right lower extremity non pressure ulcer. She received a skin grafting on 10/09/2015 and was evaluated by plastic surgery on 05/15/2017. Ms. Crymes will schedule an appointment to repeat skin grafting.  Interventions to date has included debridement and antibiotics.  She maintains that pain intensity is 10/10. She last had Oxycodone several hours ago with minimal relief.   She denies headache, chest pain, shortness of breath, nausea, vomiting diarrhea, or constipation.    Ms. Melin was recently in first trimester of pregnancy. She had a spontaneous abortion. Transvaginal ultrasound reviewed on 05/23/2016, no gestational sac was within the uterus. Patient denies current vaginal bleeding. Vaginal bleeding dissipated.   Past Medical History:  Diagnosis Date  . Anemia 03/30/2012   Hx of sickle cell disease  . Anemia complicating pregnancy 123456  . CAP (community acquired pneumonia)    2014  . Cholelithiasis y-1  . Cholelithiasis   . Elevated LFTs   . Leg ulcer ([place]) 10/27/2012   Chronic under care of wound clinic  . Sickle cell disease ([place])    Immunization History  Administered Date(s) Administered  . Influenza,inj,Quad PF,36+ Mos 03/16/2013, 01/20/2014, 03/01/2015, 02/19/2016  . Pneumococcal Conjugate-13 12/14/2015  . Pneumococcal Polysaccharide-23 03/24/2014  . Tdap 11/25/2013   Social History   Social History  . Marital status: Married    Spouse name: [name]  . Number of children: 1  . Years of education: N/A   Occupational History  . Employed in home care.    Social History Main Topics  . Smoking status: Never Smoker  . Smokeless tobacco: Never  Used  . Alcohol use No  . Drug use: No  . Sexual activity: Not Currently    Birth control/ protection: None   Other Topics Concern  . Not on file   Social History Narrative   Lives with husband.  No Known Allergies  Review of Systems  Constitutional: Positive for fatigue. Negative for fever.  HENT: Negative.   Eyes: Negative for visual disturbance.  Respiratory: Negative.   Cardiovascular: Negative.   Gastrointestinal: Negative.   Endocrine: Negative.  Negative for polydipsia, polyphagia and polyuria.  Musculoskeletal: Positive for myalgias.       Pain to right lower extremity  Skin: Negative.        Right lower extremity ulcers  Neurological: Negative.   Hematological: Negative.   Psychiatric/Behavioral: Negative.        Objective:   Physical Exam  Constitutional: She is oriented to person, place, and time. She appears well-nourished. She appears cachectic.  HENT:  Head: Normocephalic and atraumatic.  Right Ear: External ear normal.  Left Ear: External ear normal.  Nose: Nose normal.  Mouth/Throat: Oropharynx is clear and moist.  Eyes: Conjunctivae and EOM are normal. Pupils are equal, round, and reactive to light.  Neck: Normal range of motion. Neck supple.  Cardiovascular: Normal rate, normal heart sounds and intact distal pulses.   Pulmonary/Chest: Effort normal and breath sounds normal.  Abdominal: Soft. Bowel sounds are normal.  Musculoskeletal: Normal range of motion.  Neurological: She is alert and oriented to person, place, and time. She has normal reflexes.  Skin: Skin is warm and  dry.  Bilateral lower extremity ulcers, unable to examine due to dressings.    Psychiatric: She has a normal mood and affect. Her behavior is normal. Judgment and thought content normal.     BP 111/65 (BP Location: Right Arm, Patient Position: Sitting, Cuff Size: Normal)   Pulse 82   Temp 98 F (36.7 C) (Oral)   Resp 14   Ht 5\' 7"  (1.702 m)   Wt 120 lb (54.4 kg)   LMP  03/19/2016 Comment: patient double shielded  SpO2 96%   BMI 18.79 kg/m      Assessment & Plan:  1. Hb-SS disease 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 ([place])  Will start folic acid 1 mg daily to prevent aplastic bone marrow crises.   Pulmonary evaluation - Patient denies severe recurrent wheezes, shortness of breath with exercise, or persistent cough. If these symptoms develop, pulmonary function tests with spirometry will be ordered, and if abnormal, plan on referral to Pulmonology for further evaluation.  Cardiac - Routine screening for pulmonary hypertension is not recommended.  Eye - High risk of proliferative retinopathy. Annual eye exam with retinal exam recommended to patient. Last eye examination in April 2017. She states that she has an appointment scheduled  Immunization status - Patient is up to date with vaccinations   Acute and chronic painful episodes -  Will continue Oxycodone every 4 hours as needed for moderate to severe chronic pain. Ms. Stepanian has been having increased pain since stasis ulcers have resurfaced.  We will discuss titration of current dosage during next office visit.   We discussed that pt is to receive her Schedule II prescriptions only from [place]. Pt is also aware that the prescription history is available to [place] online through the [facility] CSRS. Controlled substance agreement signed previously. We reminded Ms. Calva that all patients receiving Schedule II narcotics must be seen for follow within one month of prescription being requested. We reviewed the terms of our pain agreement, including the need to keep medicines in a safe locked location away from children or pets, and the need to report excess sedation or constipation, measures to avoid constipation, and policies related to early refills and stolen prescriptions. According to the [place] Chronic Pain Initiative program, we have reviewed details related to analgesia, adverse effects, aberrant behaviors. Reviewed [place] Substance Reporting system  prior to prescribing opiate medications.  - ibuprofen (ADVIL,MOTRIN) 600 MG tablet; Take 1 tablet (600 mg total) by mouth every 8 (eight) hours as needed.  Dispense: 30 tablet; Refill: 0 - CBC with Differential - COMPLETE METABOLIC PANEL WITH GFR - Oxycodone HCl 10 MG TABS; Take 1 tablet (10 mg total) by mouth every 4 (four) hours as needed (pain).  Dispense: 90 tablet; Refill: 0  2. Vitamin D deficiency - Vitamin D, 25-hydroxy  3. Chronic pain syndrome - Oxycodone HCl 10 MG TABS; Take 1 tablet (10 mg total) by mouth every 4 (four) hours as needed (pain).  Dispense: 90 tablet; Refill: 0 RTC: 1 month for sickle cell anemia and chronic pain management  4. Ulcers of both lower legs ([place]) Follow up with plastic surgery and wound care as previously scheduled.   The patient was given clear instructions to go to ER or return to medical center if symptoms do not improve, worsen or new problems develop. The patient verbalized understanding. Will notify patient with laboratory results.  RTC: F/U in 3 months for medication management [name], FNP

## 2016-05-31 NOTE — Patient Instructions (Addendum)
Sickle Cell Anemia, Adult Sickle cell anemia is a condition in which red blood cells have an abnormal "sickle" shape. This abnormal shape shortens the cells' life span, which results in a lower than normal concentration of red blood cells in the blood. The sickle shape also causes the cells to clump together and block free blood flow through the blood vessels. As a result, the tissues and organs of the body do not receive enough oxygen. Sickle cell anemia causes organ damage and pain and increases the risk of infection. What are the causes? Sickle cell anemia is a genetic disorder. Those who receive two copies of the gene have the condition, and those who receive one copy have the trait. What increases the risk? The sickle cell gene is most common in people whose families originated in [place]. Other areas of the globe where sickle cell trait occurs include the [place], [place]. What are the signs or symptoms?  Pain, especially in the extremities, back, chest, or abdomen (common). The pain may start suddenly or may develop following an illness, especially if there is dehydration. Pain can also occur due to overexertion or exposure to extreme temperature changes.  Frequent severe bacterial infections, especially certain types of pneumonia and meningitis.  Pain and swelling in the hands and feet.  Decreased activity.  Loss of appetite.  Change in behavior.  Headaches.  Seizures.  Shortness of breath or difficulty breathing.  Vision changes.  Skin ulcers. Those with the trait may not have symptoms or they may have mild symptoms. How is this diagnosed? Sickle cell anemia is diagnosed with blood tests that demonstrate the genetic trait. It is often diagnosed during the newborn period, due to mandatory testing nationwide. A variety of blood tests, X-rays, CT scans, MRI scans, ultrasounds, and lung function tests may also be done to  monitor the condition. How is this treated? Sickle cell anemia may be treated with:  Medicines. You may be given pain medicines, antibiotic medicines (to treat and prevent infections) or medicines to increase the production of certain types of hemoglobin.  Fluids.  Oxygen.  Blood transfusions. Follow these instructions at home:  Drink enough fluid to keep your urine clear or pale yellow. Increase your fluid intake in hot weather and during exercise.  Do not smoke. Smoking lowers oxygen levels in the blood.  Only take over-the-counter or prescription medicines for pain, fever, or discomfort as directed by your health care provider.  Take antibiotics as directed by your health care provider. Make sure you finish them it even if you start to feel better.  Take supplements as directed by your health care provider.  Consider wearing a medical alert bracelet. This tells anyone caring for you in an emergency of your condition.  When traveling, keep your medical information, health care provider's names, and the medicines you take with you at all times.  If you develop a fever, do not take medicines to reduce the fever right away. This could cover up a problem that is developing. Notify your health care provider.  Keep all follow-up appointments with your health care provider. Sickle cell anemia requires regular medical care. Contact a health care provider if: You have a fever. Get help right away if:  You feel dizzy or faint.  You have new abdominal pain, especially on the left side near the stomach area.  You develop a persistent, often uncomfortable and painful penile erection (priapism). If this is not treated  immediately it will lead to impotence.  You have numbness your arms or legs or you have a hard time moving them.  You have a hard time with speech.  You have a fever or persistent symptoms for more than 2-3 days.  You have a fever and your symptoms suddenly get  worse.  You have signs or symptoms of infection. These include:  Chills.  Abnormal tiredness (lethargy).  Irritability.  Poor eating.  Vomiting.  You develop pain that is not helped with medicine.  You develop shortness of breath.  You have pain in your chest.  You are coughing up pus-like or bloody sputum.  You develop a stiff neck.  Your feet or hands swell or have pain.  Your abdomen appears bloated.  You develop joint pain. This information is not intended to replace advice given to you by your health care provider. Make sure you discuss any questions you have with your health care provider. Document Released: 08/14/2005 Document Revised: 11/24/2015 Document Reviewed: 12/16/2012 Elsevier Interactive Patient Education  2017 [employer]  Chronic Pain, Adult Chronic pain is a type of pain that lasts or keeps coming back (recurs) for at least six months. You may have chronic headaches, abdominal pain, or body pain. Chronic pain may be related to an illness, such as fibromyalgia or complex regional pain syndrome. Sometimes the cause of chronic pain is not known. Chronic pain can make it hard for you to do daily activities. If not treated, chronic pain can lead to other health problems, including anxiety and depression. Treatment depends on the cause and severity of your pain. You may need to work with a pain specialist to come up with a treatment plan. The plan may include medicine, counseling, and physical therapy. Many people benefit from a combination of two or more types of treatment to control their pain. Follow these instructions at home: Lifestyle  Consider keeping a pain diary to share with your health care providers.  Consider talking with a mental health care provider (psychologist) about how to cope with chronic pain.  Consider joining a chronic pain support group.  Try to control or lower your stress levels. Talk to your health care provider about strategies  to do this. General instructions  Take over-the-counter and prescription medicines only as told by your health care provider.  Follow your treatment plan as told by your health care provider. This may include:  Gentle, regular exercise.  Eating a healthy diet that includes foods such as vegetables, fruits, fish, and lean meats.  Cognitive or behavioral therapy.  Working with a Community education officer.  Meditation or yoga.  Acupuncture or massage therapy.  Aroma, color, light, or sound therapy.  Local electrical stimulation.  Shots (injections) of numbing or pain-relieving medicines into the spine or the area of pain.  Check your pain level as told by your health care provider. Ask your health care provider if you should use a pain scale.  Learn as much as you can about how to manage your chronic pain. Ask your health care provider if an intensive pain rehabilitation program or a chronic pain specialist would be helpful.  Keep all follow-up visits as told by your health care provider. This is important. Contact a health care provider if:  Your pain gets worse.  You have new pain.  You have trouble sleeping.  You have trouble doing your normal activities.  Your pain is not controlled with treatment.  Your have side effects from pain medicine.  You  feel weak. Get help right away if:  You lose feeling or have numbness in your body.  You lose control of bowel or bladder function.  Your pain suddenly gets much worse.  You develop shaking or chills.  You develop confusion.  You develop chest pain.  You have trouble breathing or shortness of breath.  You pass out.  You have thoughts about hurting yourself or others. This information is not intended to replace advice given to you by your health care provider. Make sure you discuss any questions you have with your health care provider. Document Released: 01/26/2002 Document Revised: 01/04/2016 Document Reviewed:  10/24/2015 Elsevier Interactive Patient Education  2017 [employer].

## 2016-06-01 LAB — COMPLETE METABOLIC PANEL WITH GFR
ALT: 23 U/L (ref 6–29)
AST: 35 U/L — AB (ref 10–30)
Albumin: 4.2 g/dL (ref 3.6–5.1)
Alkaline Phosphatase: 49 U/L (ref 33–115)
BUN: 6 mg/dL — AB (ref 7–25)
CHLORIDE: 101 mmol/L (ref 98–110)
CO2: 27 mmol/L (ref 20–31)
Calcium: 9.8 mg/dL (ref 8.6–10.2)
Creat: 0.39 mg/dL — ABNORMAL LOW (ref 0.50–1.10)
GFR, Est African American: >89 mL/min (ref >=60)
GFR, Est Non African American: >89 mL/min (ref >=60)
GLUCOSE: 71 mg/dL (ref 65–99)
POTASSIUM: 5.2 mmol/L (ref 3.5–5.3)
SODIUM: 138 mmol/L (ref 135–146)
Total Bilirubin: 2.6 mg/dL — ABNORMAL HIGH (ref 0.2–1.2)
Total Protein: 8 g/dL (ref 6.1–8.1)

## 2016-06-01 LAB — VITAMIN D 25 HYDROXY (VIT D DEFICIENCY, FRACTURES): Vit D, 25-Hydroxy: 26 ng/mL — ABNORMAL LOW (ref 30–100)

## 2016-06-03 ENCOUNTER — Other Ambulatory Visit: Payer: Self-pay | Admitting: Family Medicine

## 2016-06-03 DIAGNOSIS — E559 Vitamin D deficiency, unspecified: Secondary | ICD-10-CM

## 2016-06-03 MED ORDER — VITAMIN D 50 MCG (2000 UT) PO CAPS: 2.0000 | ORAL_CAPSULE | Freq: Every day | ORAL | 11 refills | Status: DC

## 2016-06-03 NOTE — Progress Notes (Signed)
Called and spoke with patient, advised of low vitamin D levels and that she should take 4000 iu of vitamin D daily. Advised patient that new rx was sent into pharmacy to start taking and keep next appointment. Patient verbalized understanding and had no other questions. Thanks!

## 2016-06-13 MED FILL — MUPIROCIN 2% OINTMENT: 2 | 15 days supply | Qty: 66 | Fill #4

## 2016-06-13 MED FILL — CLOBETASOL 0.05% OINTMENT: 0.05 | 21 days supply | Qty: 60 | Fill #2

## 2016-06-14 ENCOUNTER — Encounter (HOSPITAL_COMMUNITY): Payer: Self-pay | Admitting: *Deleted

## 2016-06-14 ENCOUNTER — Encounter (HOSPITAL_COMMUNITY)
Admission: RE | Admit: 2016-06-14 | Discharge: 2016-06-14 | Disposition: A | Discharge status: Home / Self Care | Payer: Medicaid Other | Source: Ambulatory Visit | Attending: Plastic Surgery | Admitting: Plastic Surgery

## 2016-06-14 DIAGNOSIS — Z01812 Encounter for preprocedural laboratory examination: Secondary | ICD-10-CM | POA: Diagnosis present

## 2016-06-14 HISTORY — DX: Unspecified open wound, unspecified lower leg, initial encounter: S81.809A

## 2016-06-14 LAB — COMPREHENSIVE METABOLIC PANEL
ALT: 30 U/L (ref 14–54)
ANION GAP: 7 (ref 5–15)
AST: 43 U/L — ABNORMAL HIGH (ref 15–41)
Albumin: 4.1 g/dL (ref 3.5–5.0)
Alkaline Phosphatase: 53 U/L (ref 38–126)
BUN: 6 mg/dL (ref 6–20)
CHLORIDE: 105 mmol/L (ref 101–111)
CO2: 25 mmol/L (ref 22–32)
CREATININE: 0.33 mg/dL — AB (ref 0.44–1.00)
Calcium: 9.7 mg/dL (ref 8.9–10.3)
Glucose, Bld: 90 mg/dL (ref 65–99)
Potassium: 3.8 mmol/L (ref 3.5–5.1)
SODIUM: 137 mmol/L (ref 135–145)
Total Bilirubin: 3.3 mg/dL — ABNORMAL HIGH (ref 0.3–1.2)
Total Protein: 8.2 g/dL — ABNORMAL HIGH (ref 6.5–8.1)

## 2016-06-14 LAB — HCG, QUANTITATIVE, PREGNANCY: HCG, BETA CHAIN, QUANT, S: 1 m[IU]/mL (ref <5)

## 2016-06-14 LAB — CBC
HCT: 22.5 % — ABNORMAL LOW (ref 36.0–46.0)
HEMOGLOBIN: 7.9 g/dL — AB (ref 12.0–15.0)
MCH: 29 pg (ref 26.0–34.0)
MCHC: 35.1 g/dL (ref 30.0–36.0)
MCV: 82.7 fL (ref 78.0–100.0)
PLATELETS: 442 10*3/uL — AB (ref 150–400)
RBC: 2.72 MIL/uL — AB (ref 3.87–5.11)
RDW: 21.5 % — ABNORMAL HIGH (ref 11.5–15.5)
WBC: 14.2 10*3/uL — AB (ref 4.0–10.5)

## 2016-06-14 NOTE — Progress Notes (Signed)
Pt denies SOB, chest pain, and being under the care of a cardiologist. Pt denies having a stress test and cardiac cath. Pt denies having an EKG and chest x ray within the last year. Spoke with Willeen Cass, NP, Anesthesia, regarding pt recent pregnancy; Levada Dy advised that a quantitative serum HCG be drawn.

## 2016-06-14 NOTE — Pre-Procedure Instructions (Signed)
Verma Jasso  06/14/2016      [place], [place]. [place]. [place] [address] Phone: [phone] Fax: [place] [address], [place] - [address] [place] [address] Phone: [phone] Fax: [phone]  [place] [address] [place] [id number] [place] [address] Phone: [phone] Fax: [place], [place] [address] Phone: [phone] Fax: [phone]    Your procedure is scheduled on Monday, June 17, 2016  Report to [facility] at 11:00 A.M.  Call this number if you have problems the morning of surgery:  [phone]   Remember:  Do not eat food or drink liquids after midnight Sunday, June 16, 2016  Take these medicines the morning of surgery with A SIP OF WATER : if needed: Oxycodone for pain, hydrOXYzine (ATARAX/VISTARIL) for itching Stop taking Aspirin, vitamins, fish oil and herbal medications. Do not take any NSAIDs ie: Ibuprofen, Advil, Naproxen, BC and Goody Powder or any medication containing Aspirin.  Do not wear jewelry, make-up or nail polish.  Do not wear lotions, powders, or perfumes, or deoderant.  Do not shave 48 hours prior to surgery.    Do not bring valuables to the hospital.  [facility] is not responsible for any belongings or valuables.  Contacts, dentures or bridgework may not be worn into surgery.  Leave your suitcase in the car.  After surgery it may be brought to your room.  For patients admitted to the hospital, discharge time will be determined by your treatment team.  Special instructions:  Special Instructions:Special Instructions: [facility] - Preparing for Surgery  Before surgery, you can play an important role.  Because skin  is not sterile, your skin needs to be as free of germs as possible.  You can reduce the number of germs on you skin by washing with CHG (chlorahexidine gluconate) soap before surgery.  CHG is an antiseptic cleaner which kills germs and bonds with the skin to continue killing germs even after washing.  Please DO NOT use if you have an allergy to CHG or antibacterial soaps.  If your skin becomes reddened/irritated stop using the CHG and inform your nurse when you arrive at Short Stay.  Do not shave (including legs and underarms) for at least 48 hours prior to the first CHG shower.  You may shave your face.  Please follow these instructions carefully:   1.  Shower with CHG Soap the night before surgery and the morning of Surgery.  2.  If you choose to wash your hair, wash your hair first as usual with your normal shampoo.  3.  After you shampoo, rinse your hair and body thoroughly to remove the Shampoo.  4.  Use CHG as you would any other liquid soap.  You can apply chg directly  to the skin and wash gently with scrungie or a clean washcloth.  5.  Apply the CHG Soap to your body ONLY FROM THE NECK DOWN.  Do not use on open wounds or open sores.  Avoid contact with your eyes, ears, mouth and genitals (private parts).  Wash genitals (private parts) with your normal soap.  6.  Wash thoroughly, paying special attention to the area where your surgery will be performed.  7.  Thoroughly rinse your  body with warm water from the neck down.  8.  DO NOT shower/wash with your normal soap after using and rinsing off the CHG Soap.  9.  Pat yourself dry with a clean towel.            10.  Wear clean pajamas.            11.  Place clean sheets on your bed the night of your first shower and do not sleep with pets.  Day of Surgery  Do not apply any lotions/deoderants the morning of surgery.  Please wear clean clothes to the hospital/surgery center.  Please read over the following fact sheets that you were  given. Pain Booklet, Coughing and Deep Breathing and Surgical Site Infection Prevention

## 2016-06-17 ENCOUNTER — Ambulatory Visit (HOSPITAL_COMMUNITY): Payer: Medicaid Other | Admitting: Certified Registered Nurse Anesthetist

## 2016-06-17 ENCOUNTER — Encounter (HOSPITAL_COMMUNITY)
Admission: RE | Disposition: A | Discharge status: Home / Self Care | Payer: Self-pay | Source: Ambulatory Visit | Attending: Plastic Surgery

## 2016-06-17 ENCOUNTER — Ambulatory Visit (HOSPITAL_COMMUNITY)
Admission: RE | Admit: 2016-06-17 | Discharge: 2016-06-17 | Disposition: A | Discharge status: Home / Self Care | Payer: Medicaid Other | Source: Ambulatory Visit | Attending: Plastic Surgery | Admitting: Plastic Surgery

## 2016-06-17 ENCOUNTER — Encounter (HOSPITAL_COMMUNITY): Payer: Self-pay | Admitting: Certified Registered Nurse Anesthetist

## 2016-06-17 DIAGNOSIS — L97329 Non-pressure chronic ulcer of left ankle with unspecified severity: Secondary | ICD-10-CM | POA: Insufficient documentation

## 2016-06-17 DIAGNOSIS — Z79899 Other long term (current) drug therapy: Secondary | ICD-10-CM | POA: Insufficient documentation

## 2016-06-17 DIAGNOSIS — L97319 Non-pressure chronic ulcer of right ankle with unspecified severity: Secondary | ICD-10-CM | POA: Insufficient documentation

## 2016-06-17 DIAGNOSIS — G894 Chronic pain syndrome: Secondary | ICD-10-CM

## 2016-06-17 DIAGNOSIS — D571 Sickle-cell disease 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: Secondary | ICD-10-CM | POA: Diagnosis not present

## 2016-06-17 HISTORY — PX: SKIN FULL THICKNESS GRAFT: SHX442

## 2016-06-17 SURGERY — APPLICATION, GRAFT, SKIN, FULL-THICKNESS
Anesthesia: Monitor Anesthesia Care | Site: Leg Lower | Laterality: Bilateral

## 2016-06-17 MED ORDER — 0.9 % SODIUM CHLORIDE (POUR BTL) OPTIME
TOPICAL | Status: DC | PRN
  Administered 2016-06-17: 1000 mL

## 2016-06-17 MED ORDER — LIDOCAINE 2% (20 MG/ML) 5 ML SYRINGE
INTRAMUSCULAR | Status: DC | PRN
  Administered 2016-06-17: 40 mg via INTRAVENOUS

## 2016-06-17 MED ORDER — MIDAZOLAM HCL 2 MG/2ML IJ SOLN
INTRAMUSCULAR | Status: AC
  Filled 2016-06-17: qty 2

## 2016-06-17 MED ORDER — OXYCODONE HCL 10 MG PO TABS: 10.0000 mg | ORAL_TABLET | ORAL | 0 refills | Status: DC | PRN

## 2016-06-17 MED ORDER — FENTANYL CITRATE (PF) 100 MCG/2ML IJ SOLN
INTRAMUSCULAR | Status: AC
  Filled 2016-06-17: qty 2

## 2016-06-17 MED ORDER — LACTATED RINGERS IV SOLN
INTRAVENOUS | Status: DC
  Administered 2016-06-17: 12:00:00 via INTRAVENOUS

## 2016-06-17 MED ORDER — MIDAZOLAM HCL 5 MG/5ML IJ SOLN
INTRAMUSCULAR | Status: DC | PRN
  Administered 2016-06-17: 2 mg via INTRAVENOUS

## 2016-06-17 MED ORDER — CEFAZOLIN SODIUM-DEXTROSE 2-4 GM/100ML-% IV SOLN
INTRAVENOUS | Status: AC
  Filled 2016-06-17: qty 100

## 2016-06-17 MED ORDER — OXYCODONE HCL 5 MG PO TABS
5.0000 mg | ORAL_TABLET | Freq: Once | ORAL | Status: AC
  Administered 2016-06-17: 5 mg via ORAL

## 2016-06-17 MED ORDER — ONDANSETRON HCL 4 MG/2ML IJ SOLN
INTRAMUSCULAR | Status: DC | PRN
  Administered 2016-06-17: 4 mg via INTRAVENOUS

## 2016-06-17 MED ORDER — PROPOFOL 500 MG/50ML IV EMUL
INTRAVENOUS | Status: DC | PRN
  Administered 2016-06-17: 75 ug/kg/min via INTRAVENOUS

## 2016-06-17 MED ORDER — FENTANYL CITRATE (PF) 100 MCG/2ML IJ SOLN
25.0000 ug | INTRAMUSCULAR | Status: DC | PRN
  Administered 2016-06-17: 50 ug via INTRAVENOUS
  Administered 2016-06-17 (×2): 25 ug via INTRAVENOUS
  Administered 2016-06-17: 50 ug via INTRAVENOUS

## 2016-06-17 MED ORDER — OXYCODONE HCL 5 MG PO TABS
ORAL_TABLET | ORAL | Status: AC
  Filled 2016-06-17: qty 1

## 2016-06-17 MED ORDER — CEFAZOLIN SODIUM-DEXTROSE 2-4 GM/100ML-% IV SOLN
2.0000 g | INTRAVENOUS | Status: AC
  Administered 2016-06-17: 2 g via INTRAVENOUS

## 2016-06-17 MED ORDER — ONDANSETRON HCL 4 MG/2ML IJ SOLN: 4.0000 mg | Freq: Once | INTRAMUSCULAR | Status: DC | PRN

## 2016-06-17 MED ORDER — LIDOCAINE-EPINEPHRINE (PF) 1 %-1:200000 IJ SOLN
INTRAMUSCULAR | Status: AC
  Filled 2016-06-17: qty 30

## 2016-06-17 MED ORDER — MEPERIDINE HCL 25 MG/ML IJ SOLN: 6.2500 mg | INTRAMUSCULAR | Status: DC | PRN

## 2016-06-17 MED ORDER — LIDOCAINE-EPINEPHRINE (PF) 1 %-1:200000 IJ SOLN
INTRAMUSCULAR | Status: DC | PRN
  Administered 2016-06-17: 10 mL

## 2016-06-17 MED ORDER — FENTANYL CITRATE (PF) 100 MCG/2ML IJ SOLN
INTRAMUSCULAR | Status: DC | PRN
  Administered 2016-06-17: 25 ug via INTRAVENOUS
  Administered 2016-06-17: 50 ug via INTRAVENOUS
  Administered 2016-06-17 (×2): 25 ug via INTRAVENOUS
  Administered 2016-06-17: 50 ug via INTRAVENOUS
  Administered 2016-06-17: 25 ug via INTRAVENOUS
  Administered 2016-06-17 (×2): 50 ug via INTRAVENOUS

## 2016-06-17 MED ORDER — KETAMINE HCL-SODIUM CHLORIDE 100-0.9 MG/10ML-% IV SOSY
PREFILLED_SYRINGE | INTRAVENOUS | Status: AC
  Filled 2016-06-17: qty 10

## 2016-06-17 MED ORDER — KETAMINE HCL 10 MG/ML IJ SOLN
INTRAMUSCULAR | Status: DC | PRN
  Administered 2016-06-17 (×5): 20 mg via INTRAVENOUS

## 2016-06-17 MED ORDER — PROPOFOL 10 MG/ML IV BOLUS
INTRAVENOUS | Status: DC | PRN
  Administered 2016-06-17 (×2): 20 mg via INTRAVENOUS

## 2016-06-17 MED ORDER — PROPOFOL 10 MG/ML IV BOLUS
INTRAVENOUS | Status: AC
  Filled 2016-06-17: qty 20

## 2016-06-17 SURGICAL SUPPLY — 58 items
BANDAGE ELASTIC 4 VELCRO ST LF (GAUZE/BANDAGES/DRESSINGS) ×2 IMPLANT
BENZOIN TINCTURE PRP APPL 2/3 (GAUZE/BANDAGES/DRESSINGS) ×2 IMPLANT
BLADE 10 SAFETY STRL DISP (BLADE) ×2 IMPLANT
BLADE SURG 10 STRL SS (BLADE) ×2 IMPLANT
BLADE SURG 15 STRL LF DISP TIS (BLADE) ×1 IMPLANT
BLADE SURG 15 STRL SS (BLADE) ×1
BLADE SURG ROTATE 9660 (MISCELLANEOUS) IMPLANT
BNDG GAUZE ELAST 4 BULKY (GAUZE/BANDAGES/DRESSINGS) ×2 IMPLANT
BRUSH SCRUB EZ PLAIN DRY (MISCELLANEOUS) IMPLANT
CANISTER SUCT 1200ML W/VALVE (MISCELLANEOUS) IMPLANT
CLEANER TIP ELECTROSURG 2X2 (MISCELLANEOUS) IMPLANT
CORDS BIPOLAR (ELECTRODE) IMPLANT
COVER MAYO STAND STRL (DRAPES) ×2 IMPLANT
COVER SURGICAL LIGHT HANDLE (MISCELLANEOUS) ×2 IMPLANT
COVER TABLE BACK 60X90 (DRAPES) ×2 IMPLANT
DRAIN CHANNEL 15F RND FF W/TCR (WOUND CARE) IMPLANT
DRAIN WOUND SNY 15 RND (WOUND CARE) IMPLANT
DRAPE PROXIMA HALF (DRAPES) IMPLANT
DRAPE U-SHAPE 76X120 STRL (DRAPES) ×4 IMPLANT
DRSG ADAPTIC 3X8 NADH LF (GAUZE/BANDAGES/DRESSINGS) ×2 IMPLANT
DRSG PAD ABDOMINAL 8X10 ST (GAUZE/BANDAGES/DRESSINGS) ×2 IMPLANT
ELECT COATED BLADE 2.86 ST (ELECTRODE) ×2 IMPLANT
ELECT REM PT RETURN 9FT ADLT (ELECTROSURGICAL)
ELECT REM PT RETURN 9FT PED (ELECTROSURGICAL)
ELECTRODE REM PT RETRN 9FT PED (ELECTROSURGICAL) IMPLANT
ELECTRODE REM PT RTRN 9FT ADLT (ELECTROSURGICAL) IMPLANT
EVACUATOR SILICONE 100CC (DRAIN) IMPLANT
GAUZE SPONGE 2X2 8PLY STRL LF (GAUZE/BANDAGES/DRESSINGS) ×1 IMPLANT
GAUZE SPONGE 4X4 16PLY XRAY LF (GAUZE/BANDAGES/DRESSINGS) IMPLANT
GAUZE XEROFORM 1X8 LF (GAUZE/BANDAGES/DRESSINGS) IMPLANT
GLOVE BIO SURGEON STRL SZ 6 (GLOVE) ×4 IMPLANT
GLOVE SURG SS PI 6.0 STRL IVOR (GLOVE) ×2 IMPLANT
GOWN STRL REUS W/ TWL XL LVL3 (GOWN DISPOSABLE) IMPLANT
GOWN STRL REUS W/TWL XL LVL3 (GOWN DISPOSABLE)
KIT BASIN OR (CUSTOM PROCEDURE TRAY) ×2 IMPLANT
NEEDLE HYPO 30X.5 LL (NEEDLE) ×2 IMPLANT
NEEDLE PRECISIONGLIDE 27X1.5 (NEEDLE) ×2 IMPLANT
NS IRRIG 1000ML POUR BTL (IV SOLUTION) IMPLANT
PACK GENERAL/GYN (CUSTOM PROCEDURE TRAY) ×2 IMPLANT
PEN SKIN MARKING BROAD (MISCELLANEOUS) ×2 IMPLANT
PENCIL BUTTON HOLSTER BLD 10FT (ELECTRODE) IMPLANT
SPONGE GAUZE 2X2 STER 10/PKG (GAUZE/BANDAGES/DRESSINGS) ×1
SPONGE LAP 18X18 X RAY DECT (DISPOSABLE) ×2 IMPLANT
STAPLER VISISTAT 35W (STAPLE) ×2 IMPLANT
STRIP CLOSURE SKIN 1/2X4 (GAUZE/BANDAGES/DRESSINGS) ×6 IMPLANT
SUT CHROMIC 4 0 P 3 18 (SUTURE) ×4 IMPLANT
SUT CHROMIC 5 0 P 3 (SUTURE) ×2 IMPLANT
SUT MNCRL AB 4-0 PS2 18 (SUTURE) IMPLANT
SUT MON AB 5-0 P3 18 (SUTURE) IMPLANT
SUT PDS AB 2-0 CT1 27 (SUTURE) IMPLANT
SUT SILK 4 0 PS 2 (SUTURE) IMPLANT
SUT VIC AB 3-0 PS2 18 (SUTURE)
SUT VIC AB 3-0 PS2 18XBRD (SUTURE) IMPLANT
SUT VIC AB 4-0 PS2 27 (SUTURE) IMPLANT
SUT VICRYL 4-0 PS2 18IN ABS (SUTURE) IMPLANT
SYR CONTROL 10ML LL (SYRINGE) ×2 IMPLANT
TISSUE THERASKIN 1X2 (Tissue) ×4 IMPLANT
TISSUE THERASKIN 2X3 (Tissue) ×2 IMPLANT

## 2016-06-17 NOTE — H&P (Signed)
Subjective:     Patient ID: [name] is a 46 y.o. female.  Follow-up   Referred by Dr. Con Memos for surgical debridement. Known to me from [facility] and prior debridement, application Theraskin to RLE wounds, last 09/2015. Etiology not established, but felt to be due to sickle cell and chronic anemia, thrombocytosis. Continues on oxycodone for her chronic pain related to the wound and this is provided by her PCP. After her last visit here, seen at [facility] for evaluation pyoderma- they did not feel her exam was consistent with this and had course Bactroban and oral antibiotics. No real change in wounds.   Prior biopsies during surgical debridement wound negative for pyoderma.       Objective:   Physical Exam  Constitutional: She is oriented to person, place, and time.  Cardiovascular: Normal rate, regular rhythm and normal heart sounds.   Pulmonary/Chest: Effort normal and breath sounds normal.  BLE superficial ulcerations without cellulitis    Assessment:     non pressure ulcer ankle Sickle cell, chronic anemia    Plan:     Plan debridement of wounds and application Theraskin. Reviewed risks infection, anesthesia, need for additional treatments. Patient has been unable to tolerate any in Wound Clinic debridements due to pain.  Irene Limbo, MD [facility] Surgery [phone], pin [phone]

## 2016-06-17 NOTE — Transfer of Care (Signed)
Immediate Anesthesia Transfer of Care Note  Patient: [name]  Procedure(s) Performed: Procedure(s): SURGICAL PREP FOR GRAFTING, BILATERAL LOWER EXTREMITIES AND APPLICATION OF THERASKIN (Bilateral)  Patient Location: PACU  Anesthesia Type:MAC  Level of Consciousness: awake, alert  and oriented  Airway & Oxygen Therapy: Patient Spontanous Breathing and Patient connected to face mask oxygen  Post-op Assessment: Report given to RN and Post -op Vital signs reviewed and stable  Post vital signs: Reviewed and stable  Last Vitals:  Vitals:   06/17/16 1130 06/17/16 1438  BP: 129/78 (!) 155/86  Pulse: 76 72  Resp: 18 20  Temp: 37.1 C 36.7 C    Last Pain:  Vitals:   06/17/16 1154  TempSrc:   PainSc: 10-Worst pain ever      Patients Stated Pain Goal: 0 (27/87/18 3672)  Complications: No apparent anesthesia complications

## 2016-06-17 NOTE — Anesthesia Preprocedure Evaluation (Addendum)
Anesthesia Evaluation  Patient identified by MRN, date of birth, ID band Patient awake    Reviewed: Allergy & Precautions, NPO status , Patient's Chart, lab work & pertinent test results  Airway Mallampati: I  TM Distance: >3 FB Neck ROM: Full    Dental no notable dental hx. (+) Teeth Intact   Pulmonary pneumonia, resolved,    Pulmonary exam normal breath sounds clear to auscultation       Cardiovascular negative cardio ROS Normal cardiovascular exam Rhythm:Regular Rate:Normal     Neuro/Psych negative neurological ROS  negative psych ROS   GI/Hepatic negative GI ROS, Neg liver ROS,   Endo/Other  negative endocrine ROS  Renal/GU negative Renal ROS  negative genitourinary   Musculoskeletal Chronic wounds both lower extremities   Abdominal   Peds  Hematology  (+) Sickle cell anemia and anemia ,   Anesthesia Other Findings   Reproductive/Obstetrics                            Anesthesia Physical Anesthesia Plan  ASA: III  Anesthesia Plan: MAC   Post-op Pain Management:    Induction: Intravenous  Airway Management Planned: Natural Airway, Nasal Cannula and Simple Face Mask  Additional Equipment:   Intra-op Plan:   Post-operative Plan:   Informed Consent: I have reviewed the patients History and Physical, chart, labs and discussed the procedure including the risks, benefits and alternatives for the proposed anesthesia with the patient or authorized representative who has indicated his/her understanding and acceptance.   Dental advisory given  Plan Discussed with: Anesthesiologist, CRNA and Surgeon  Anesthesia Plan Comments:         Anesthesia Quick Evaluation

## 2016-06-17 NOTE — Op Note (Signed)
Operative Note   DATE OF OPERATION: 1.29.18  LOCATION: [facility] OR-outpatient  SURGICAL DIVISION: Plastic Surgery  PREOPERATIVE DIAGNOSES:  1. Non pressure ulcer bilateral ankles to subcutaneous tissue 2. Sickle cell disease  POSTOPERATIVE DIAGNOSES:  same  PROCEDURE:  1. Surgical preparation for graft bilateral ankles 83 cm2  2. Application Theraskin to bilateral ankles 63 cm2  SURGEON: Irene Limbo MD MBA  ASSISTANT: none  ANESTHESIA:  MAC.   EBL: minimal  COMPLICATIONS: None immediate.   INDICATIONS FOR PROCEDURE:  The patient, [name], is a 46 y.o. female born on [birth year], is here for debridement bilateral chronic ulcerations ankles in setting of sickle cell disease.   FINDINGS: Left medial ankle with 1 x 1 x 0.5 cm open wound. Right medial ankle with 10 x 5 x 0.5 cm open wound. Right lateral ankle 8 x 4 x 0.5 cm open wound. No exposure bone.  DESCRIPTION OF PROCEDURE:  The patient's operative site was marked with the patient in the preoperative area. The patient was taken to the operating room. IV antibiotics were given. The patient's operative site was prepped and draped in a sterile fashion. A time out was performed and all information was confirmed to be correct. Local anesthetic infiltrated surrounding all ulcerations. Curettage performed to remove all slough from ulcers and to remove skin edges of ulcers to bleeding tissue. Additional sharp excision with knife of thick adherent eschar over right medial and lateral ankle. Theraskin prepared and applied over entire area of ulceration of medial and lateral right ankle and small left ankle wound. Total area applied 63 cm2.This was secured with interrupted 4-0 and 5-0 chromic. Benzoin applied to peri wound and adaptic guaze placed over Theraskin and secured to skin with steri strips. Dry guaze, kerlix and Ace wrap applied to right leg. Guaze and medipore tape applied to left ankle.  The patient was allowed to wake from  anesthesia, extubated and taken to the recovery room in satisfactory condition.   SPECIMENS: none  DRAINS: none  Irene Limbo, MD [facility] Reconstruct[facility] Surgery [phone], pin [phone]

## 2016-06-17 NOTE — Anesthesia Procedure Notes (Signed)
Procedure Name: MAC Date/Time: 06/17/2016 1:23 PM Performed by: Candis Shine Pre-anesthesia Checklist: Patient identified, Emergency Drugs available, Suction available, Patient being monitored and Timeout performed Patient Re-evaluated:Patient Re-evaluated prior to inductionOxygen Delivery Method: Simple face mask Dental Injury: Teeth and Oropharynx as per pre-operative assessment

## 2016-06-18 NOTE — Anesthesia Postprocedure Evaluation (Signed)
Anesthesia Post Note  Patient: Tamaria Adams  Procedure(s) Performed: Procedure(s) (LRB): SURGICAL PREP FOR GRAFTING, BILATERAL LOWER EXTREMITIES AND APPLICATION OF THERASKIN (Bilateral)  Patient location during evaluation: PACU Anesthesia Type: MAC Level of consciousness: awake and alert and oriented Pain management: pain level controlled Vital Signs Assessment: post-procedure vital signs reviewed and stable Respiratory status: spontaneous breathing, nonlabored ventilation and respiratory function stable Cardiovascular status: stable and blood pressure returned to baseline Postop Assessment: no signs of nausea or vomiting Anesthetic complications: no        Last Vitals:  Vitals:   06/17/16 1615 06/17/16 1617  BP:    Pulse: (!) 59 61  Resp: 15 16  Temp:      Last Pain:  Vitals:   06/17/16 1615  TempSrc:   PainSc: Asleep   Pain Goal: Patients Stated Pain Goal: 0 (06/17/16 1154)               Teancum Brule A.

## 2016-06-19 ENCOUNTER — Encounter (HOSPITAL_COMMUNITY): Payer: Self-pay | Admitting: Plastic Surgery

## 2016-07-08 ENCOUNTER — Telehealth: Payer: Self-pay

## 2016-07-08 DIAGNOSIS — D571 Sickle-cell disease 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: Secondary | ICD-10-CM

## 2016-07-08 DIAGNOSIS — G894 Chronic pain syndrome: Secondary | ICD-10-CM

## 2016-07-09 MED ORDER — OXYCODONE HCL 10 MG PO TABS: 10.0000 mg | ORAL_TABLET | ORAL | 0 refills | Status: DC | PRN

## 2016-07-09 NOTE — Telephone Encounter (Signed)
Reviewed [place] Substance Reporting system prior to prescribing opiate medications, no inconsistencies noted.   Meds ordered this encounter  Medications  . Oxycodone HCl 10 MG TABS    Sig: Take 1 tablet (10 mg total) by mouth every 4 (four) hours as needed (pain).    Dispense:  90 tablet    Refill:  0    Order Specific Question:   Supervising Provider    Answer:   Tresa Garter [id number]    [name], FNP

## 2016-07-10 ENCOUNTER — Encounter (HOSPITAL_COMMUNITY): Payer: Self-pay | Admitting: Plastic Surgery

## 2016-07-10 ENCOUNTER — Encounter (HOSPITAL_BASED_OUTPATIENT_CLINIC_OR_DEPARTMENT_OTHER): Payer: Medicaid Other | Attending: Surgery

## 2016-07-10 ENCOUNTER — Other Ambulatory Visit: Payer: Self-pay | Admitting: Family Medicine

## 2016-07-10 DIAGNOSIS — D571 Sickle-cell disease 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: Secondary | ICD-10-CM | POA: Insufficient documentation

## 2016-07-10 DIAGNOSIS — L97322 Non-pressure chronic ulcer of left ankle with fat layer exposed: Secondary | ICD-10-CM | POA: Insufficient documentation

## 2016-07-10 DIAGNOSIS — L97312 Non-pressure chronic ulcer of right ankle with fat layer exposed: Secondary | ICD-10-CM | POA: Diagnosis not present

## 2016-07-10 MED FILL — oxyCODONE HCL 5 MG TABS: 5 | 15 days supply | Qty: 180 | Fill #0

## 2016-07-17 DIAGNOSIS — L97322 Non-pressure chronic ulcer of left ankle with fat layer exposed: Secondary | ICD-10-CM | POA: Diagnosis not present

## 2016-07-24 ENCOUNTER — Encounter (HOSPITAL_BASED_OUTPATIENT_CLINIC_OR_DEPARTMENT_OTHER): Payer: Medicaid Other | Attending: Surgery

## 2016-07-30 ENCOUNTER — Other Ambulatory Visit: Payer: Self-pay | Admitting: Internal Medicine

## 2016-07-30 ENCOUNTER — Telehealth: Payer: Self-pay

## 2016-07-30 DIAGNOSIS — D571 Sickle-cell disease 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: Secondary | ICD-10-CM

## 2016-07-30 DIAGNOSIS — G894 Chronic pain syndrome: Secondary | ICD-10-CM

## 2016-07-30 MED ORDER — OXYCODONE HCL 10 MG PO TABS: 10.0000 mg | ORAL_TABLET | ORAL | 0 refills | Status: DC | PRN

## 2016-07-30 MED ORDER — HYDROXYZINE HCL 10 MG PO TABS: 10.0000 mg | ORAL_TABLET | Freq: Three times a day (TID) | ORAL | 2 refills | Status: DC | PRN

## 2016-07-30 MED ORDER — IBUPROFEN 600 MG PO TABS: 600.0000 mg | ORAL_TABLET | Freq: Three times a day (TID) | ORAL | 1 refills | Status: DC | PRN

## 2016-07-30 MED FILL — hydrOXYzine HCL 10 MG TABS: 10 | 10 days supply | Qty: 30 | Fill #0

## 2016-07-30 MED FILL — IBUPROFEN 600 MG TABLET: 600 | 20 days supply | Qty: 60 | Fill #0

## 2016-08-01 MED FILL — oxyCODONE HCL 10 MG TABS: 10 | 15 days supply | Qty: 90 | Fill #0

## 2016-08-01 MED FILL — CLOBETASOL 0.05% OINTMENT: 0.05 | 21 days supply | Qty: 60 | Fill #3

## 2016-08-01 MED FILL — METHYLPREDNISOLONE 4 MG TAB: 4 | 6 days supply | Qty: 21 | Fill #0

## 2016-08-07 MED FILL — CIPROFLOXACIN HCL 500 MG TA: 500 | 10 days supply | Qty: 20 | Fill #0

## 2016-08-07 MED FILL — METHYLPREDNISOLONE 4 MG TAB: 4 | 30 days supply | Qty: 60 | Fill #0

## 2016-08-15 ENCOUNTER — Inpatient Hospital Stay (HOSPITAL_COMMUNITY)
Admission: EM | Admit: 2016-08-15 | Discharge: 2016-08-18 | DRG: 812 | Disposition: A | Discharge status: Home / Self Care | Payer: Medicaid Other | Attending: Internal Medicine | Admitting: Internal Medicine

## 2016-08-15 ENCOUNTER — Emergency Department (HOSPITAL_COMMUNITY): Payer: Medicaid Other

## 2016-08-15 ENCOUNTER — Encounter (HOSPITAL_COMMUNITY): Payer: Self-pay | Admitting: *Deleted

## 2016-08-15 DIAGNOSIS — G894 Chronic pain syndrome: Secondary | ICD-10-CM | POA: Diagnosis present

## 2016-08-15 DIAGNOSIS — Z79899 Other long term (current) drug therapy: Secondary | ICD-10-CM

## 2016-08-15 DIAGNOSIS — L97919 Non-pressure chronic ulcer of unspecified part of right lower leg with unspecified severity: Secondary | ICD-10-CM | POA: Diagnosis present

## 2016-08-15 DIAGNOSIS — I83028 Varicose veins of left lower extremity with ulcer other part of lower leg: Secondary | ICD-10-CM | POA: Diagnosis present

## 2016-08-15 DIAGNOSIS — L97909 Non-pressure chronic ulcer of unspecified part of unspecified lower leg with unspecified severity: Secondary | ICD-10-CM | POA: Diagnosis present

## 2016-08-15 DIAGNOSIS — L97929 Non-pressure chronic ulcer of unspecified part of left lower leg with unspecified severity: Secondary | ICD-10-CM

## 2016-08-15 DIAGNOSIS — Z832 Family history of diseases of the blood and blood-forming organs and certain disorders involving the immune mechanism: Secondary | ICD-10-CM

## 2016-08-15 DIAGNOSIS — D5700 Hb-SS disease with crisis, unspecified: Principal | ICD-10-CM | POA: Diagnosis present

## 2016-08-15 DIAGNOSIS — Z7952 Long term (current) use of systemic steroids: Secondary | ICD-10-CM

## 2016-08-15 DIAGNOSIS — D72829 Elevated white blood cell count, unspecified: Secondary | ICD-10-CM | POA: Diagnosis present

## 2016-08-15 DIAGNOSIS — I83018 Varicose veins of right lower extremity with ulcer other part of lower leg: Secondary | ICD-10-CM | POA: Diagnosis present

## 2016-08-15 LAB — I-STAT TROPONIN, ED: Troponin i, poc: 0 ng/mL (ref 0.00–0.08)

## 2016-08-15 LAB — COMPREHENSIVE METABOLIC PANEL
ALT: 19 U/L (ref 14–54)
ANION GAP: 6 (ref 5–15)
AST: 33 U/L (ref 15–41)
Albumin: 4.3 g/dL (ref 3.5–5.0)
Alkaline Phosphatase: 53 U/L (ref 38–126)
BUN: 8 mg/dL (ref 6–20)
CALCIUM: 8.8 mg/dL — AB (ref 8.9–10.3)
CHLORIDE: 102 mmol/L (ref 101–111)
CO2: 27 mmol/L (ref 22–32)
Creatinine, Ser: 0.39 mg/dL — ABNORMAL LOW (ref 0.44–1.00)
GFR calc non Af Amer: >60 mL/min (ref >60)
Glucose, Bld: 93 mg/dL (ref 65–99)
Potassium: 3.7 mmol/L (ref 3.5–5.1)
SODIUM: 135 mmol/L (ref 135–145)
Total Bilirubin: 3.9 mg/dL — ABNORMAL HIGH (ref 0.3–1.2)
Total Protein: 8.2 g/dL — ABNORMAL HIGH (ref 6.5–8.1)

## 2016-08-15 LAB — I-STAT BETA HCG BLOOD, ED (MC, WL, AP ONLY)

## 2016-08-15 LAB — CBC WITH DIFFERENTIAL/PLATELET
BASOS PCT: 0 %
Basophils Absolute: 0 10*3/uL (ref 0.0–0.1)
EOS ABS: 0.2 10*3/uL (ref 0.0–0.7)
EOS PCT: 1 %
HCT: 21.2 % — ABNORMAL LOW (ref 36.0–46.0)
Hemoglobin: 7.6 g/dL — ABNORMAL LOW (ref 12.0–15.0)
LYMPHS PCT: 7 %
Lymphs Abs: 1.5 10*3/uL (ref 0.7–4.0)
MCH: 32.2 pg (ref 26.0–34.0)
MCHC: 35.8 g/dL (ref 30.0–36.0)
MCV: 89.8 fL (ref 78.0–100.0)
Monocytes Absolute: 1.9 10*3/uL — ABNORMAL HIGH (ref 0.1–1.0)
Monocytes Relative: 9 %
NEUTROS PCT: 83 %
Neutro Abs: 17.2 10*3/uL — ABNORMAL HIGH (ref 1.7–7.7)
Platelets: 542 10*3/uL — ABNORMAL HIGH (ref 150–400)
RBC: 2.36 MIL/uL — ABNORMAL LOW (ref 3.87–5.11)
RDW: 23.4 % — ABNORMAL HIGH (ref 11.5–15.5)
WBC: 20.8 10*3/uL — ABNORMAL HIGH (ref 4.0–10.5)

## 2016-08-15 LAB — RETICULOCYTES
RBC.: 2.36 MIL/uL — AB (ref 3.87–5.11)
Retic Ct Pct: >23 % — ABNORMAL HIGH (ref 0.4–3.1)

## 2016-08-15 LAB — BRAIN NATRIURETIC PEPTIDE: B NATRIURETIC PEPTIDE 5: 48.3 pg/mL (ref 0.0–100.0)

## 2016-08-15 MED ORDER — HYDROMORPHONE HCL 1 MG/ML IJ SOLN
2.0000 mg | INTRAMUSCULAR | Status: AC
  Administered 2016-08-15: 2 mg via INTRAVENOUS
  Filled 2016-08-15: qty 2

## 2016-08-15 MED ORDER — HYDROMORPHONE HCL 1 MG/ML IJ SOLN
2.0000 mg | INTRAMUSCULAR | Status: AC
  Administered 2016-08-15: 1 mg via INTRAVENOUS
  Filled 2016-08-15: qty 2

## 2016-08-15 MED ORDER — HYDROMORPHONE HCL 1 MG/ML IJ SOLN
1.0000 mg | Freq: Once | INTRAMUSCULAR | Status: AC
  Administered 2016-08-15: 1 mg via INTRAVENOUS
  Filled 2016-08-15: qty 1

## 2016-08-15 MED ORDER — HYDROMORPHONE HCL 1 MG/ML IJ SOLN: 2.0000 mg | INTRAMUSCULAR | Status: AC

## 2016-08-15 MED ORDER — HYDROMORPHONE HCL 1 MG/ML IJ SOLN
0.5000 mg | Freq: Once | INTRAMUSCULAR | Status: AC
  Administered 2016-08-15: 0.5 mg via SUBCUTANEOUS
  Filled 2016-08-15: qty 0.5

## 2016-08-15 MED ORDER — DIPHENHYDRAMINE HCL 25 MG PO CAPS
25.0000 mg | ORAL_CAPSULE | ORAL | Status: DC | PRN
  Administered 2016-08-16: 25 mg via ORAL
  Filled 2016-08-15: qty 1

## 2016-08-15 MED ORDER — SODIUM CHLORIDE 0.45 % IV SOLN
INTRAVENOUS | Status: DC
  Administered 2016-08-15: 20:00:00 via INTRAVENOUS

## 2016-08-15 MED ORDER — KETOROLAC TROMETHAMINE 30 MG/ML IJ SOLN
30.0000 mg | INTRAMUSCULAR | Status: AC
  Administered 2016-08-15: 30 mg via INTRAVENOUS
  Filled 2016-08-15: qty 1

## 2016-08-15 MED ORDER — ONDANSETRON HCL 4 MG/2ML IJ SOLN
4.0000 mg | INTRAMUSCULAR | Status: DC | PRN
  Administered 2016-08-15: 4 mg via INTRAVENOUS
  Filled 2016-08-15: qty 2

## 2016-08-15 NOTE — ED Triage Notes (Signed)
Pt states she is having sickle cell pain crisis. Pt has pain in entire right arm radiating down her right torso since Monday. Pt took oxycodone (5pm) and ibuprofen (4pm), which has not provided relief.

## 2016-08-15 NOTE — ED Notes (Signed)
Pt's sats dropped from mid 90s to mid 80s after dilaudid.  Placed on 2 L [place] and MD Liu aware.  Will continue to monitor pt's sats and at next dosing time if still unable to keep sats in mid-90s without the [place] will give only half dose (1mg ) and alert MD.

## 2016-08-16 ENCOUNTER — Encounter (HOSPITAL_COMMUNITY): Payer: Self-pay | Admitting: Family Medicine

## 2016-08-16 DIAGNOSIS — Z7952 Long term (current) use of systemic steroids: Secondary | ICD-10-CM | POA: Diagnosis not present

## 2016-08-16 DIAGNOSIS — L97909 Non-pressure chronic ulcer of unspecified part of unspecified lower leg with unspecified severity: Secondary | ICD-10-CM | POA: Diagnosis present

## 2016-08-16 DIAGNOSIS — L97929 Non-pressure chronic ulcer of unspecified part of left lower leg with unspecified severity: Secondary | ICD-10-CM

## 2016-08-16 DIAGNOSIS — G894 Chronic pain syndrome: Secondary | ICD-10-CM

## 2016-08-16 DIAGNOSIS — L97919 Non-pressure chronic ulcer of unspecified part of right lower leg with unspecified severity: Secondary | ICD-10-CM

## 2016-08-16 DIAGNOSIS — Z79899 Other long term (current) drug therapy: Secondary | ICD-10-CM | POA: Diagnosis not present

## 2016-08-16 DIAGNOSIS — Z832 Family history of diseases of the blood and blood-forming organs and certain disorders involving the immune mechanism: Secondary | ICD-10-CM | POA: Diagnosis not present

## 2016-08-16 DIAGNOSIS — D5700 Hb-SS disease with crisis, unspecified: Principal | ICD-10-CM

## 2016-08-16 DIAGNOSIS — I83018 Varicose veins of right lower extremity with ulcer other part of lower leg: Secondary | ICD-10-CM | POA: Diagnosis present

## 2016-08-16 DIAGNOSIS — I83028 Varicose veins of left lower extremity with ulcer other part of lower leg: Secondary | ICD-10-CM | POA: Diagnosis present

## 2016-08-16 DIAGNOSIS — D72829 Elevated white blood cell count, unspecified: Secondary | ICD-10-CM | POA: Diagnosis present

## 2016-08-16 LAB — GLUCOSE, CAPILLARY
GLUCOSE-CAPILLARY: 109 mg/dL — AB (ref 65–99)
GLUCOSE-CAPILLARY: 118 mg/dL — AB (ref 65–99)
Glucose-Capillary: 88 mg/dL (ref 65–99)
Glucose-Capillary: 99 mg/dL (ref 65–99)

## 2016-08-16 MED ORDER — OXYCODONE HCL 5 MG PO TABS
10.0000 mg | ORAL_TABLET | ORAL | Status: DC
  Administered 2016-08-16 – 2016-08-18 (×13): 10 mg via ORAL
  Filled 2016-08-16 (×13): qty 2

## 2016-08-16 MED ORDER — HYDROMORPHONE HCL 1 MG/ML IJ SOLN
1.0000 mg | Freq: Once | INTRAMUSCULAR | Status: AC
  Administered 2016-08-16: 1 mg via INTRAVENOUS
  Filled 2016-08-16: qty 1

## 2016-08-16 MED ORDER — DEXTROSE-NACL 5-0.45 % IV SOLN
INTRAVENOUS | Status: DC
  Administered 2016-08-16 (×2): via INTRAVENOUS
  Administered 2016-08-17: 1000 mL via INTRAVENOUS

## 2016-08-16 MED ORDER — NALOXONE HCL 0.4 MG/ML IJ SOLN: 0.4000 mg | INTRAMUSCULAR | Status: DC | PRN

## 2016-08-16 MED ORDER — POLYETHYLENE GLYCOL 3350 17 G PO PACK
17.0000 g | PACK | Freq: Every day | ORAL | Status: DC | PRN
Start: 2016-08-16 — End: 2016-08-18
  Administered 2016-08-18: 17 g via ORAL
  Filled 2016-08-16: qty 1

## 2016-08-16 MED ORDER — CIPROFLOXACIN HCL 500 MG PO TABS
500.0000 mg | ORAL_TABLET | Freq: Two times a day (BID) | ORAL | Status: AC
  Administered 2016-08-16 – 2016-08-17 (×4): 500 mg via ORAL
  Filled 2016-08-16 (×4): qty 1

## 2016-08-16 MED ORDER — HYDROMORPHONE 1 MG/ML IV SOLN
INTRAVENOUS | Status: DC
  Administered 2016-08-16: 6 mg via INTRAVENOUS
  Administered 2016-08-16: 5.5 mg via INTRAVENOUS
  Administered 2016-08-16: 07:00:00 via INTRAVENOUS
  Administered 2016-08-17: 5 mg via INTRAVENOUS
  Administered 2016-08-17 (×2): 3 mg via INTRAVENOUS
  Administered 2016-08-17: 7 mg via INTRAVENOUS
  Administered 2016-08-17: 04:00:00 via INTRAVENOUS
  Administered 2016-08-18: 2 mg via INTRAVENOUS
  Administered 2016-08-18: 25 mg via INTRAVENOUS
  Administered 2016-08-18: 3 mg via INTRAVENOUS
  Administered 2016-08-18: 3.5 mg via INTRAVENOUS
  Filled 2016-08-16 (×3): qty 25

## 2016-08-16 MED ORDER — SENNOSIDES-DOCUSATE SODIUM 8.6-50 MG PO TABS
1.0000 | ORAL_TABLET | Freq: Two times a day (BID) | ORAL | Status: DC
  Administered 2016-08-16 – 2016-08-18 (×5): 1 via ORAL
  Filled 2016-08-16 (×5): qty 1

## 2016-08-16 MED ORDER — OXYCODONE HCL 5 MG PO TABS
10.0000 mg | ORAL_TABLET | Freq: Once | ORAL | Status: AC
  Administered 2016-08-16: 10 mg via ORAL
  Filled 2016-08-16: qty 2

## 2016-08-16 MED ORDER — SODIUM CHLORIDE 0.9% FLUSH: 9.0000 mL | INTRAVENOUS | Status: DC | PRN

## 2016-08-16 MED ORDER — ONDANSETRON HCL 4 MG PO TABS: 4.0000 mg | ORAL_TABLET | ORAL | Status: DC | PRN

## 2016-08-16 MED ORDER — ONDANSETRON HCL 4 MG/2ML IJ SOLN: 4.0000 mg | Freq: Four times a day (QID) | INTRAMUSCULAR | Status: DC | PRN

## 2016-08-16 MED ORDER — ENOXAPARIN SODIUM 40 MG/0.4ML ~~LOC~~ SOLN
40.0000 mg | SUBCUTANEOUS | Status: DC
  Administered 2016-08-16 – 2016-08-18 (×3): 40 mg via SUBCUTANEOUS
  Filled 2016-08-16 (×3): qty 0.4

## 2016-08-16 MED ORDER — KETOROLAC TROMETHAMINE 15 MG/ML IJ SOLN
15.0000 mg | Freq: Four times a day (QID) | INTRAMUSCULAR | Status: DC
  Administered 2016-08-16 – 2016-08-18 (×10): 15 mg via INTRAVENOUS
  Filled 2016-08-16 (×10): qty 1

## 2016-08-16 MED ORDER — ONDANSETRON HCL 4 MG/2ML IJ SOLN
4.0000 mg | INTRAMUSCULAR | Status: DC | PRN
  Administered 2016-08-16 – 2016-08-17 (×2): 4 mg via INTRAVENOUS
  Filled 2016-08-16 (×2): qty 2

## 2016-08-16 MED ORDER — HYDROXYZINE HCL 10 MG PO TABS
10.0000 mg | ORAL_TABLET | Freq: Three times a day (TID) | ORAL | Status: DC | PRN
  Filled 2016-08-16: qty 1

## 2016-08-16 MED ORDER — METHYLPREDNISOLONE 4 MG PO TABS
8.0000 mg | ORAL_TABLET | Freq: Every day | ORAL | Status: DC
  Administered 2016-08-16 – 2016-08-18 (×3): 8 mg via ORAL
  Filled 2016-08-16 (×3): qty 2

## 2016-08-16 NOTE — H&P (Signed)
History and Physical  Patient Name: [name]     JDB:[id number]    DOB: 02/08/1971    DOA: 08/15/2016 PCP: [name], FNP   Patient coming from: Home  Chief Complaint: Pain crisis  HPI: [name] is a 46 y.o. female with a past medical history significant for sickle cell disease and chronic leg ulcer who presents with complaints of sickle cell pain.  The patient was in her usual state of health until a few days ago when she developed constant, aching severe pain in her right arm, from the shoulder down.  She tried taking her home oxycodone and ibuprofen, but this did not help and so tonight she came to the ER.  ED course: -Afebrile, heart rate 69, respirations and 24, pulse oximetry 94% on room air, dips with hydromorphone, blood pressure 133/87 -Na 135, K 3.7, Cr 0.39, WBC 20.8K, Hgb 7.6 -BNP normal, troponin negative -Pregnancy test negative -Chest x-ray clear -ECG showed sinus rhythm -She was given hydromorphone per protocol but still rated her pain as 8 out of 10 and so TRH were asked to evaluate for admission for sickel cell pain crisis    Of note, she was started on Cipro and medrol 1 week ago by her wound care center, cipro to be finished in 2 days, medrol to continue.     ROS: Review of Systems  Constitutional: Negative for chills and fever.  Respiratory: Negative for cough and shortness of breath.   Cardiovascular: Negative for chest pain.  Musculoskeletal:       Right arm pain  Skin:       Ulcers, chronic  All other systems reviewed and are negative.         Past Medical History:  Diagnosis Date  . Anemia 03/30/2012   Hx of sickle cell disease  . Anemia complicating pregnancy 10/30/2447  . CAP (community acquired pneumonia)    2014  . Cholelithiasis y-1  . Cholelithiasis   . Elevated LFTs   . Leg ulcer ([place]) 10/27/2012   Chronic under care of wound clinic  . Multiple open wounds of lower extremity    chronic wounds B/LLE  . Sickle cell  disease ([place])     Past Surgical History:  Procedure Laterality Date  . ALLOGRAFT APPLICATION Right 7/53/0051   Procedure: SURGICAL PREP FOR GRAFTING RIGHT LOWER EXTREMITY AND APPLICATION OF Jannifer Hick;  Surgeon: Irene Limbo, MD;  Location: [facility];  Service: Plastics;  Laterality: Right;  . APPLICATION OF A-CELL OF EXTREMITY Right 10/09/2015   Procedure: APPLICATION OF Jannifer Hick;  Surgeon: Irene Limbo, MD;  Location: [facility];  Service: Plastics;  Laterality: Right;  . BREAST SURGERY     left breast cyst aspiration  . CESAREAN SECTION    . CESAREAN SECTION N/A 01/06/2014   Procedure: CESAREAN SECTION;  Surgeon: Emily Filbert, MD;  Location: [facility];  Service: Obstetrics;  Laterality: N/A;  . I&D EXTREMITY Right 10/09/2015   Procedure: SURGICAL PREPARATION FOR GRAFTING RIGHT ANKLE AND APPLICATION THERASKIN;  Surgeon: Irene Limbo, MD;  Location: [facility];  Service: Plastics;  Laterality: Right;  . SKIN FULL THICKNESS GRAFT Bilateral 06/17/2016   Procedure: SURGICAL PREP FOR GRAFTING, BILATERAL LOWER EXTREMITIES AND APPLICATION OF Jannifer Hick;  Surgeon: Irene Limbo, MD;  Location: [facility];  Service: Plastics;  Laterality: Bilateral;  . TONSILLECTOMY      Social History: Patient lives with her husband and children.  The patient walks unassisted.  She works as a  CNA.  She does not smoke.    No Known Allergies  Family history: family history includes Asthma in her mother; Diabetes in her mother; Hypertension in her father; Sickle cell anemia in her sister; Sickle cell trait in her father and mother.  Prior to Admission medications   Medication Sig Start Date End Date Taking? Authorizing Provider  Cholecalciferol (VITAMIN D) 2000 units CAPS Take 2 capsules (4,000 Units total) by mouth daily. 06/03/16  Yes [name], FNP  ciprofloxacin (CIPRO) 500 MG tablet Take 500 mg by mouth 2 (two) times daily. ABT Start Date 08/07/16 & End  Date 08/17/16. 08/07/16  Yes Historical Provider, MD  folic acid (FOLVITE) 1 MG tablet Take 1 tablet (1 mg total) by mouth daily. 03/21/16  Yes [name], NP  ibuprofen (ADVIL,MOTRIN) 600 MG tablet Take 1 tablet (600 mg total) by mouth every 8 (eight) hours as needed. 07/30/16  Yes Tresa Garter, MD  methylPREDNISolone (MEDROL) 4 MG tablet Take 8 mg by mouth daily.  08/07/16  Yes Historical Provider, MD  Oxycodone HCl 10 MG TABS Take 1 tablet (10 mg total) by mouth every 4 (four) hours as needed (pain). 07/30/16  Yes Tresa Garter, MD  hydrOXYzine (ATARAX/VISTARIL) 10 MG tablet Take 1 tablet (10 mg total) by mouth 3 (three) times daily as needed for itching. 07/30/16   Tresa Garter, MD  ketoconazole (NIZORAL) 2 % cream Apply 1 application topically daily. Patient not taking: Reported on 08/15/2016 12/14/15   [name], NP       Physical Exam: BP 114/70 (BP Location: Left Arm)   Pulse (!) 54   Temp 98.2 F (36.8 C) (Oral)   Resp 14   Ht 5\' 7"  (1.702 m)   Wt 53.5 kg (118 lb)   LMP 08/13/2016 Comment: patient double shielded  SpO2 99%   BMI 18.48 kg/m  General appearance: Well-developed, adult female, alert and in moderate distress from pain.   Eyes: Anicteric, conjunctiva pink, lids and lashes normal. PERRL.    ENT: No nasal deformity, discharge, epistaxis.  Hearing normal. OP moist without lesions.   Neck: No neck masses.  Trachea midline.  No thyromegaly/tenderness. Lymph: No cervical or supraclavicular lymphadenopathy. Skin: Warm and dry.  No jaundice.  No suspicious rashes or lesions. Cardiac: RRR, nl S1-S2, no murmurs appreciated.  Capillary refill is brisk.  No LE edema.  Radialpulses 2+ and symmetric.  Left DP pulse normal Respiratory: Normal respiratory rate and rhythm.  CTAB without rales or wheezes. Abdomen: Abdomen soft.  No TTP. No ascites, distension, hepatosplenomegaly.   MSK: No deformities or effusions.  No cyanosis or clubbing.  The right arm  and right ankle are both wrapped in ACE bandage. Neuro: Cranial nerves grossly normal.  Sensation intact to light touch. Speech is fluent.  Muscle strength normal.    Psych: Sensorium intact and responding to questions, attention normal.  Behavior appropriate.  Affect blunted by pain.  Judgment and insight appear normal.     Labs on Admission:  I have personally reviewed following labs and imaging studies: CBC:  Recent Labs Lab 08/15/16 1956  WBC 20.8*  NEUTROABS 17.2*  HGB 7.6*  HCT 21.2*  MCV 89.8  PLT 102*   Basic Metabolic Panel:  Recent Labs Lab 08/15/16 1956  NA 135  K 3.7  CL 102  CO2 27  GLUCOSE 93  BUN 8  CREATININE 0.39*  CALCIUM 8.8*   GFR: Estimated Creatinine Clearance: 75.8 mL/min (A) (by  C-G formula based on SCr of 0.39 mg/dL (L)).  Liver Function Tests:  Recent Labs Lab 08/15/16 1956  AST 33  ALT 19  ALKPHOS 53  BILITOT 3.9*  PROT 8.2*  ALBUMIN 4.3   No results for input(s): LIPASE, AMYLASE in the last 168 hours. No results for input(s): AMMONIA in the last 168 hours. Coagulation Profile: No results for input(s): INR, PROTIME in the last 168 hours. Cardiac Enzymes: No results for input(s): CKTOTAL, CKMB, CKMBINDEX, TROPONINI in the last 168 hours. BNP (last 3 results) No results for input(s): PROBNP in the last 8760 hours. HbA1C: No results for input(s): HGBA1C in the last 72 hours. CBG: No results for input(s): GLUCAP in the last 168 hours. Lipid Profile: No results for input(s): CHOL, HDL, LDLCALC, TRIG, CHOLHDL, LDLDIRECT in the last 72 hours. Thyroid Function Tests: No results for input(s): TSH, T4TOTAL, FREET4, T3FREE, THYROIDAB in the last 72 hours. Anemia Panel:  Recent Labs  08/15/16 1956  RETICCTPCT >23.0*   Sepsis Labs: Invalid input(s): PROCALCITONIN, LACTICIDVEN No results found for this or any previous visit (from the past 240 hour(s)).       Radiological Exams on Admission: Personally reviewed CXR clear: Dg  Chest 2 View  Result Date: 08/15/2016 CLINICAL DATA:  Sickle cell pain crisis, acute onset. Right arm pain, extending down the right side of the torso. Initial encounter. EXAM: CHEST  2 VIEW COMPARISON:  Chest radiograph performed 10/16/2012, and CT of the chest performed 10/18/2012 FINDINGS: The lungs are well-aerated. Mild vascular congestion is noted. There is no evidence of focal opacification, pleural effusion or pneumothorax. The heart is mildly enlarged. No acute osseous abnormalities are seen. IMPRESSION: Mild vascular congestion and mild cardiomegaly. Lungs remain grossly clear. Electronically Signed   By: Garald Balding M.D.   On: 08/15/2016 20:55    EKG: Independently reviewed. Rate 62, QTc 447, normal sinus.    Assessment/Plan    1. Vaso-occlussive sickle cell pain crisis: Will admit per sickle cell protocol for pain control.  No evidence of acute chest at this time. -Weight based high-dose IV hydromorphone PCA with continuous ETCO2 monitoring -IV Ketorolac ordered -Hydrate with IVF D5 .45% saline @ 100 cc/hr  -Hydroxyzine, ondansetron and bowel regimen    2. Sickle cell anemia:  Baseline Hgb 7 -Transfuse as needed if Hg drops significantly below baseline.  3. Leg ulcer:  -Continue prednisolone 8 mg daily -Continue ciprofloxacin for 2 more days       DVT prophylaxis: Lovenox  Code Status: FULL  Family Communication: None present  Disposition Plan: Initiate IV hydromorphone PCA now, Sickle cell team to take over management in AM. Consults called: None Admission status: INPATIENT, med surg         Medical decision making: Patient seen at 5:50 AM on 08/16/2016.  The patient was discussed with Dr. Regenia Skeeter.  What exists of the patient's chart was reviewed in depth and summarized above.  Clinical condition: currently hemodynamically stable with continuous respiratory monitoring.        Edwin Dada [facility] Pager [phone]

## 2016-08-16 NOTE — ED Notes (Signed)
Vital signs stable. Pt medicated for pain and itching and transported to inpatient bed

## 2016-08-16 NOTE — ED Provider Notes (Signed)
Care transferred to me. Pain still poorly controlled. Patient wants to be admitted at this time. 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 Dr. Loleta Books to admit.   Sherwood Gambler, MD 08/16/16 [phone]

## 2016-08-16 NOTE — Progress Notes (Signed)
Patient arrived to floor around 645 this am. Patient's condition was stable and vitals WNL. PCA was set up. Report given to day shift RN, whom will complete assessment and admission hx.Roderick Pee

## 2016-08-16 NOTE — ED Provider Notes (Signed)
[facility] DEPT Provider Note   CSN: [id number] Arrival date & time: 08/15/16  1819     History   Chief Complaint Chief Complaint  Patient presents with  . Sickle Cell Pain Crisis    HPI [name] is a 46 y.o. female.  HPI 46 year old female who presents with sickle cell pain crisis. She has history of sickle cell anemia. States onset of typical crisis pain in the right arm and bilateral lower ribs starting 4 days ago. Taking oxycodone at home without improvement in symptoms. No cough, fever, difficulty breathing, syncope or near syncope. No LE edema or calf tenderness. No abdominal pain, n/v/d. No urinary complaints.   Past Medical History:  Diagnosis Date  . Anemia 03/30/2012   Hx of sickle cell disease  . Anemia complicating pregnancy 8/67/6720  . CAP (community acquired pneumonia)    2014  . Cholelithiasis y-1  . Cholelithiasis   . Elevated LFTs   . Leg ulcer ([place]) 10/27/2012   Chronic under care of wound clinic  . Multiple open wounds of lower extremity    chronic wounds B/LLE  . Sickle cell disease [facility])     Patient Active Problem List   Diagnosis Date Noted  . Sickle cell pain crisis ([place]) 08/16/2016  . Vaginal bleeding in pregnancy, first trimester 05/21/2016  . Threatened abortion   . Vaginal bleeding   . Pregnancy at early stage   . Sickle cell anemia with crisis ([place]) 04/02/2016  . Rash and nonspecific skin eruption 03/28/2015  . Ulcers of both lower legs ([place]) 02/14/2015  . Hb-SS disease 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 ([place]) 03/24/2014  . IUGR (intrauterine growth restriction) 01/06/2014  . Anemia complicating pregnancy 94/70/9628  . Leukocytosis, unspecified 10/21/2013  . Hx of preeclampsia, prior pregnancy, currently pregnant 07/22/2013  . AMA--age > 40 07/22/2013  . Previous cesarean delivery, antepartum condition or complication 36/62/9476  . History of preterm delivery, currently pregnant 07/22/2013  . Sickle cell anemia affecting pregnancy, antepartum  07/15/2013  . High risk pregnancy due to history of previous obstetrical problem 07/15/2013  . 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 Hemochromatosis 11/10/2012  . Chronic pain syndrome 10/13/2012  . Sinus bradycardia by electrocardiogram 04/03/2012  . Chronic wound of extremity 04/01/2012  . Elevated LFTs 03/30/2012  . Cholelithiasis 03/30/2012  . Leukocytosis 03/30/2012    Past Surgical History:  Procedure Laterality Date  . ALLOGRAFT APPLICATION Right 5/46/5035   Procedure: SURGICAL PREP FOR GRAFTING RIGHT LOWER EXTREMITY AND APPLICATION OF Jannifer Hick;  Surgeon: Irene Limbo, MD;  Location: [facility];  Service: Plastics;  Laterality: Right;  . APPLICATION OF A-CELL OF EXTREMITY Right 10/09/2015   Procedure: APPLICATION OF Jannifer Hick;  Surgeon: Irene Limbo, MD;  Location: [facility];  Service: Plastics;  Laterality: Right;  . BREAST SURGERY     left breast cyst aspiration  . CESAREAN SECTION    . CESAREAN SECTION N/A 01/06/2014   Procedure: CESAREAN SECTION;  Surgeon: Emily Filbert, MD;  Location: [facility];  Service: Obstetrics;  Laterality: N/A;  . I&D EXTREMITY Right 10/09/2015   Procedure: SURGICAL PREPARATION FOR GRAFTING RIGHT ANKLE AND APPLICATION THERASKIN;  Surgeon: Irene Limbo, MD;  Location: [facility];  Service: Plastics;  Laterality: Right;  . SKIN FULL THICKNESS GRAFT Bilateral 06/17/2016   Procedure: SURGICAL PREP FOR GRAFTING, BILATERAL LOWER EXTREMITIES AND APPLICATION OF Jannifer Hick;  Surgeon: Irene Limbo, MD;  Location: [facility];  Service: Plastics;  Laterality: Bilateral;  . TONSILLECTOMY  OB History    Gravida Para Term Preterm AB Living   7 4 1 3 2 2    SAB TAB Ectopic Multiple Live Births   2       3       Home Medications    Prior to Admission medications   Medication Sig Start Date End Date Taking? Authorizing Provider  Cholecalciferol (VITAMIN D) 2000 units CAPS Take 2 capsules (4,000 Units  total) by mouth daily. 06/03/16  Yes [name], FNP  ciprofloxacin (CIPRO) 500 MG tablet Take 500 mg by mouth 2 (two) times daily. ABT Start Date 08/07/16 & End Date 08/17/16. 08/07/16  Yes Historical Provider, MD  folic acid (FOLVITE) 1 MG tablet Take 1 tablet (1 mg total) by mouth daily. 03/21/16  Yes [name], NP  ibuprofen (ADVIL,MOTRIN) 600 MG tablet Take 1 tablet (600 mg total) by mouth every 8 (eight) hours as needed. 07/30/16  Yes Tresa Garter, MD  methylPREDNISolone (MEDROL) 4 MG tablet Take 8 mg by mouth daily.  08/07/16  Yes Historical Provider, MD  Oxycodone HCl 10 MG TABS Take 1 tablet (10 mg total) by mouth every 4 (four) hours as needed (pain). 07/30/16  Yes Tresa Garter, MD  hydrOXYzine (ATARAX/VISTARIL) 10 MG tablet Take 1 tablet (10 mg total) by mouth 3 (three) times daily as needed for itching. 07/30/16   Tresa Garter, MD  ketoconazole (NIZORAL) 2 % cream Apply 1 application topically daily. Patient not taking: Reported on 08/15/2016 12/14/15   [name], NP    Family History Family History  Problem Relation Age of Onset  . Sickle cell anemia Sister   . Diabetes Mother   . Asthma Mother   . Sickle cell trait Mother   . Sickle cell trait Father   . Hypertension Father     Social History Social History  Substance Use Topics  . Smoking status: Never Smoker  . Smokeless tobacco: Never Used  . Alcohol use No     Allergies   Patient has no known allergies.   Review of Systems Review of Systems 10/14 systems reviewed and are negative other than those stated in the HPI   Physical Exam Updated Vital Signs BP 114/76 (BP Location: Left Arm)   Pulse 63   Temp 98.6 F (37 C) (Oral)   Resp 12   Ht 5\' 7"  (1.702 m)   Wt 116 lb (52.6 kg)   LMP 08/13/2016 Comment: patient double shielded  SpO2 100%   BMI 18.17 kg/m   Physical Exam Physical Exam  Nursing note and vitals reviewed. Constitutional: Appears uncomfortable due to  pain, non-toxic appearing Head: Normocephalic and atraumatic.  Mouth/Throat: Oropharynx is clear and moist.  Neck: Normal range of motion. Neck supple.  Cardiovascular: Normal rate and regular rhythm.   Pulmonary/Chest: Effort normal and breath sounds normal. lower anterior chest wall pain Abdominal: Soft. There is no tenderness. There is no rebound and no guarding.  Musculoskeletal: Normal range of motion. No deformities.  Neurological: Alert, no facial droop, fluent speech, moves all extremities symmetrically Skin: Skin is warm and dry.  Psychiatric: Cooperative   ED Treatments / Results  Labs (all labs ordered are listed, but only abnormal results are displayed) Labs Reviewed  COMPREHENSIVE METABOLIC PANEL - Abnormal; Notable for the following:       Result Value   Creatinine, Ser 0.39 (*)    Calcium 8.8 (*)    Total Protein 8.2 (*)    Total  Bilirubin 3.9 (*)    All other components within normal limits  CBC WITH DIFFERENTIAL/PLATELET - Abnormal; Notable for the following:    WBC 20.8 (*)    RBC 2.36 (*)    Hemoglobin 7.6 (*)    HCT 21.2 (*)    RDW 23.4 (*)    Platelets 542 (*)    Neutro Abs 17.2 (*)    Monocytes Absolute 1.9 (*)    All other components within normal limits  RETICULOCYTES - Abnormal; Notable for the following:    Retic Ct Pct >23.0 (*)    RBC. 2.36 (*)    All other components within normal limits  BRAIN NATRIURETIC PEPTIDE  GLUCOSE, CAPILLARY  GLUCOSE, CAPILLARY  I-STAT BETA HCG BLOOD, ED ([facility], [facility], [facility] ONLY)  I-STAT TROPOININ, ED    EKG  EKG Interpretation  Date/Time:  Thursday August 15 2016 20:17:14 EDT Ventricular Rate:  62 PR Interval:    QRS Duration: 89 QT Interval:  440 QTC Calculation: 447 R Axis:   78 Text Interpretation:  Sinus rhythm similar to prioe EKG 03/29/2012 Confirmed by Kaida Games MD, Hinton Dyer ([id number]) on 08/15/2016 9:38:25 PM Also confirmed by Oleta Mouse MD, Markea Ruzich [phone]), editor [place]  CCT, [place] ([id number])  on 08/16/2016 7:46:05 AM        Radiology Dg Chest 2 View  Result Date: 08/15/2016 CLINICAL DATA:  Sickle cell pain crisis, acute onset. Right arm pain, extending down the right side of the torso. Initial encounter. EXAM: CHEST  2 VIEW COMPARISON:  Chest radiograph performed 10/16/2012, and CT of the chest performed 10/18/2012 FINDINGS: The lungs are well-aerated. Mild vascular congestion is noted. There is no evidence of focal opacification, pleural effusion or pneumothorax. The heart is mildly enlarged. No acute osseous abnormalities are seen. IMPRESSION: Mild vascular congestion and mild cardiomegaly. Lungs remain grossly clear. Electronically Signed   By: Garald Balding M.D.   On: 08/15/2016 20:55    Procedures Procedures (including critical care time)  Medications Ordered in ED Medications  HYDROmorphone (DILAUDID) injection 2 mg (2 mg Intravenous Not Given 08/15/16 2308)    Or  HYDROmorphone (DILAUDID) injection 2 mg ( Subcutaneous See Alternative 08/15/16 2308)  ciprofloxacin (CIPRO) tablet 500 mg (500 mg Oral Given 08/16/16 0831)  methylPREDNISolone (MEDROL) tablet 8 mg (8 mg Oral Given 08/16/16 1040)  hydrOXYzine (ATARAX/VISTARIL) tablet 10 mg (not administered)  senna-docusate (Senokot-S) tablet 1 tablet (1 tablet Oral Given 08/16/16 1039)  polyethylene glycol (MIRALAX / GLYCOLAX) packet 17 g (not administered)  naloxone (NARCAN) injection 0.4 mg (not administered)    And  sodium chloride flush (NS) 0.9 % injection 9 mL (not administered)  enoxaparin (LOVENOX) injection 40 mg (40 mg Subcutaneous Given 08/16/16 1039)  HYDROmorphone (DILAUDID) 1 mg/mL PCA injection (6 mg Intravenous Received 08/16/16 1132)  ondansetron (ZOFRAN) tablet 4 mg (not administered)    Or  ondansetron (ZOFRAN) injection 4 mg (not administered)  ketorolac (TORADOL) 15 MG/ML injection 15 mg (15 mg Intravenous Given 08/16/16 1243)  dextrose 5 %-0.45 % sodium chloride infusion ( Intravenous New Bag/Given 08/16/16 0651)  HYDROmorphone  (DILAUDID) injection 0.5 mg (0.5 mg Subcutaneous Given 08/15/16 1853)  ketorolac (TORADOL) 30 MG/ML injection 30 mg (30 mg Intravenous Given 08/15/16 2025)  HYDROmorphone (DILAUDID) injection 2 mg (2 mg Intravenous Given 08/15/16 2025)    Or  HYDROmorphone (DILAUDID) injection 2 mg ( Subcutaneous See Alternative 08/15/16 2025)  HYDROmorphone (DILAUDID) injection 2 mg (1 mg Intravenous Given 08/15/16 2158)    Or  HYDROmorphone (DILAUDID) injection 2 mg (  Subcutaneous See Alternative 08/15/16 2158)  HYDROmorphone (DILAUDID) injection 1 mg (1 mg Intravenous Given 08/15/16 2308)  oxyCODONE (Oxy IR/ROXICODONE) immediate release tablet 10 mg (10 mg Oral Given 08/16/16 0130)  HYDROmorphone (DILAUDID) injection 1 mg (1 mg Intravenous Given 08/16/16 0153)  HYDROmorphone (DILAUDID) injection 1 mg (1 mg Intravenous Given 08/16/16 8871)     Initial Impression / Assessment and Plan / ED Course  I have reviewed the triage vital signs and the nursing notes.  Pertinent labs & imaging results that were available during my care of the patient were reviewed by me and considered in my medical decision making (see chart for details).     Presents with vaso occlusive crisis. Records reviewed. Patient with very few ED visits and admission for sickle cell crisis. Appears uncomfortable and in pain. Afebrile, hemodynamically stable, no respiratory distress.  Anemia near baseline. With leukocytosis but no signs of infection, likely stress reaction from crisis. Appropriate reticulocytosis. EKG nonischemic and CXR visualized, without infiltrate.   With O2 desaturation with 2 mg dilaudid, and subsequent doses 1 mg and 1 mg with improved pain. Able to eventually wean off oxygen to baseline but had return of sever pain in the right arm. Discussed admission, but patient requests additional treatment in ED to avoid admission. Will redose 1 mg dilaudid. Re-evaluation signed out to Dr. Regenia Skeeter. May still require admission if pain not  poorly controlled.  Final Clinical Impressions(s) / ED Diagnoses   Final diagnoses:  Sickle cell pain crisis [facility])    New Prescriptions Current Discharge Medication List       Forde Dandy, MD 08/16/16 1249

## 2016-08-16 NOTE — ED Notes (Signed)
Pt ambulated to bathroom independently, steady gait.  MD aware of sats in upper 80s and low 90s.  Will continue to monitor them.  Pain 2/10.

## 2016-08-16 NOTE — ED Notes (Signed)
Pt ambulated w/pulse ox per MD Liu.  Tolerated well on room air, sats 95-98%.  Denies trouble breathing or weakness/dizziness.  MD Liu aware.

## 2016-08-16 NOTE — Consult Note (Addendum)
[facility] Nurse wound consult note Reason for Consult: RLE venous stasis ulcers Wound type: full thickness venous insufficiency ulcer Pressure Injury POA: N/A Measurement: Right medial wound 10cm x 4cm, wound bed is elevated over the wound edges, 40% pink, 60% yelllow, pt states large amount of drainage, (dressing was just changed so no drainage currently, no odor noted. Right lateral wound 9 cm x 4 cm, wound bed is elevated over the wound edges, 60% pink, 40% yelllow, pt states large amount of drainage, (see above), no odor noted. Wound bed: see above Drainage (amount, consistency, odor) see above Periwound:see above Dressing procedure/placement/frequency: I have provided nurses with orders for Cleanse with NS, gently pat dry, apply Xeroform gauze, gauze, ABD, wrap with kerlix in a spiral fashion, from ankle to knee, change every shift. We will not follow, but will remain available to this patient, to nursing, and the medical and/or surgical teams. Please re-consult if we need to assist further.    Fara Olden, RN-C, WTA-C Wound Treatment Associate

## 2016-08-16 NOTE — Progress Notes (Signed)
Patient ID: [name], female   DOB: Jan 20, 1971, 46 y.o.   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 She usually has minimal sickle cell pain but has been having pain associated with her leg ulcers and thus has been taking  Oxycodone about 4 x day. She also has been taking medrol for her leg wounds for the last 2 weeks under direction of the wound care center.   I have reviewed her chart, examined and interviewed patient. Currently her pain is localized to her right forearm and at an intensity of 3/10. I will schedule her Oxycodone every 4 hours and continue PCA for PRN use. Pt is hopeful for a discharge home tomorrow.   [name] A.

## 2016-08-17 LAB — GLUCOSE, CAPILLARY
GLUCOSE-CAPILLARY: 132 mg/dL — AB (ref 65–99)
Glucose-Capillary: 91 mg/dL (ref 65–99)
Glucose-Capillary: 99 mg/dL (ref 65–99)
Glucose-Capillary: 113 mg/dL — ABNORMAL HIGH (ref 65–99)

## 2016-08-17 LAB — PREPARE RBC (CROSSMATCH)

## 2016-08-17 LAB — CBC WITH DIFFERENTIAL/PLATELET
BASOS ABS: 0 10*3/uL (ref 0.0–0.1)
Basophils Relative: 0 %
EOS PCT: 2 %
Eosinophils Absolute: 0.3 10*3/uL (ref 0.0–0.7)
HEMATOCRIT: 17.7 % — AB (ref 36.0–46.0)
HEMOGLOBIN: 6.3 g/dL — AB (ref 12.0–15.0)
LYMPHS PCT: 16 %
Lymphs Abs: 2 10*3/uL (ref 0.7–4.0)
MCH: 31.2 pg (ref 26.0–34.0)
MCHC: 35.6 g/dL (ref 30.0–36.0)
MCV: 87.6 fL (ref 78.0–100.0)
Monocytes Absolute: 2 10*3/uL — ABNORMAL HIGH (ref 0.1–1.0)
Monocytes Relative: 16 %
NEUTROS ABS: 8.5 10*3/uL — AB (ref 1.7–7.7)
Neutrophils Relative %: 66 %
Platelets: 439 10*3/uL — ABNORMAL HIGH (ref 150–400)
RBC: 2.02 MIL/uL — AB (ref 3.87–5.11)
RDW: 20.9 % — ABNORMAL HIGH (ref 11.5–15.5)
WBC: 12.8 10*3/uL — ABNORMAL HIGH (ref 4.0–10.5)

## 2016-08-17 MED ORDER — SODIUM CHLORIDE 0.9 % IV SOLN
Freq: Once | INTRAVENOUS | Status: AC
  Administered 2016-08-17: 14:00:00 via INTRAVENOUS

## 2016-08-17 NOTE — Progress Notes (Signed)
Patient tolerated blood transfusion. No reaction noted.

## 2016-08-17 NOTE — Progress Notes (Signed)
Patient ID: [name], female   DOB: Jan 07, 1971, 45 y.o.   MRN: [id number] Subjective:  Patient doing slightly better, pain is at baseline without movements, but when she moves her hand around, she feels escalating pain. Right now, pain is at 3/10. No fever, no chest pain, no SOB.  Objective:  Vital signs in last 24 hours:  Vitals:   08/17/16 0409 08/17/16 0450 08/17/16 0751 08/17/16 0958  BP:  118/70  118/63  Pulse:  60  (!) 53  Resp: 14 16 12 14   Temp:  98.4 F (36.9 C)  97.8 F (36.6 C)  TempSrc:  Oral  Oral  SpO2: 100% 100% 99% 100%  Weight:      Height:       Intake/Output from previous day:   Intake/Output Summary (Last 24 hours) at 08/17/16 1142 Last data filed at 08/17/16 0959  Gross per 24 hour  Intake          3356.25 ml  Output                0 ml  Net          3356.25 ml   Physical Exam: General: Alert, awake, oriented x3, in no acute distress.  HEENT: Progress/AT PEERL, EOMI Neck: Trachea midline,  no masses, no thyromegal,y no JVD, no carotid bruit OROPHARYNX:  Moist, No exudate/ erythema/lesions.  Heart: Regular rate and rhythm, without murmurs, rubs, gallops, PMI non-displaced, no heaves or thrills on palpation.  Lungs: Clear to auscultation, no wheezing or rhonchi noted. No increased vocal fremitus resonant to percussion  Abdomen: Soft, nontender, nondistended, positive bowel sounds, no masses no hepatosplenomegaly noted..  Neuro: No focal neurological deficits noted cranial nerves II through XII grossly intact. DTRs 2+ bilaterally upper and lower extremities. Strength 5 out of 5 in bilateral upper and lower extremities. Musculoskeletal: No warm swelling or erythema around joints, no spinal tenderness noted. The right arm and right ankle are both wrapped in ACE bandage. Psychiatric: Patient alert and oriented x3, good insight and cognition, good recent to remote recall. Lymph node survey: No cervical axillary or inguinal lymphadenopathy noted.  Lab  Results:  Basic Metabolic Panel:    Component Value Date/Time   NA 135 08/15/2016 1956   NA 139 01/20/2014 0758   K 3.7 08/15/2016 1956   K 3.9 01/20/2014 0758   CL 102 08/15/2016 1956   CO2 27 08/15/2016 1956   CO2 23 01/20/2014 0758   BUN 8 08/15/2016 1956   BUN 6.0 (L) 01/20/2014 0758   CREATININE 0.39 (L) 08/15/2016 1956   CREATININE 0.39 (L) 05/31/2016 1144   CREATININE 0.5 (L) 01/20/2014 0758   GLUCOSE 93 08/15/2016 1956   GLUCOSE 72 01/20/2014 0758   CALCIUM 8.8 (L) 08/15/2016 1956   CALCIUM 9.0 01/20/2014 0758   CBC:    Component Value Date/Time   WBC 12.8 (H) 08/17/2016 0705   HGB 6.3 (LL) 08/17/2016 0705   HGB 8.7 (L) 01/20/2014 0757   HCT 17.7 (L) 08/17/2016 0705   HCT 25.2 (L) 01/20/2014 0757   PLT 439 (H) 08/17/2016 0705   PLT 611 (H) 01/20/2014 0757   MCV 87.6 08/17/2016 0705   MCV 84.3 01/20/2014 0757   NEUTROABS 8.5 (H) 08/17/2016 0705   NEUTROABS 8.7 (H) 01/20/2014 0757   LYMPHSABS 2.0 08/17/2016 0705   LYMPHSABS 2.7 01/20/2014 0757   MONOABS 2.0 (H) 08/17/2016 0705   MONOABS 1.9 (H) 01/20/2014 0757   EOSABS 0.3 08/17/2016 0705   EOSABS 0.5  01/20/2014 0757   BASOSABS 0.0 08/17/2016 0705   BASOSABS 0.2 (H) 01/20/2014 0757   No results found for this or any previous visit (from the past 240 hour(s)).  Studies/Results: Dg Chest 2 View  Result Date: 08/15/2016 CLINICAL DATA:  Sickle cell pain crisis, acute onset. Right arm pain, extending down the right side of the torso. Initial encounter. EXAM: CHEST  2 VIEW COMPARISON:  Chest radiograph performed 10/16/2012, and CT of the chest performed 10/18/2012 FINDINGS: The lungs are well-aerated. Mild vascular congestion is noted. There is no evidence of focal opacification, pleural effusion or pneumothorax. The heart is mildly enlarged. No acute osseous abnormalities are seen. IMPRESSION: Mild vascular congestion and mild cardiomegaly. Lungs remain grossly clear. Electronically Signed   By: Garald Balding M.D.    On: 08/15/2016 20:55    Medications: Scheduled Meds: . sodium chloride   Intravenous Once  . ciprofloxacin  500 mg Oral BID  . enoxaparin (LOVENOX) injection  40 mg Subcutaneous Q24H  . HYDROmorphone   Intravenous Q4H  . ketorolac  15 mg Intravenous Q6H  . methylPREDNISolone  8 mg Oral Daily  . oxyCODONE  10 mg Oral Q4H  . senna-docusate  1 tablet Oral BID   Continuous Infusions: . dextrose 5 % and 0.45% NaCl 1,000 mL (08/17/16 0813)   Consultants:  None  Procedures:  None  Antibiotics:  None  Assessment/Plan: Principal Problem:   Sickle cell pain crisis ([place]) Active Problems:   Chronic pain syndrome   Ulcers of both lower legs ([place])  1. Hb SS with crisis: Hb baseline is around 8, Hb dropped to 6.3 today, will transfuse with 1 unit of PRBC because of multiple ulcers, to promote healing. Continue PCA and other adjunct therapies for now. 2. Leukocytosis: Improving, possibly dehydration Vs Crisis 3. Anemia: Transfuse 1 unit of PRBC today, H and H in am 4. Leg Ulcers: Continue prednisolone and Ciprofloxacin   Code Status: Full Code Family Communication: N/A Disposition Plan: Not yet ready for discharge  Addelynn Batte  If 7PM-7AM, please contact night-coverage.  08/17/2016, 11:42 AM  LOS: 1 day

## 2016-08-17 NOTE — Progress Notes (Signed)
CRITICAL VALUE ALERT  Critical value received:  Hgb 6.3  Date of notification:  08/17/16  Time of notification:  0301  Critical value read back:Yes.    Nurse who received alert:  Sandie Ano  MD notified (1st page):  Dr. Doreene Burke  Time of first page:  [phone]  MD notified (2nd page):  Time of second page:  Responding MD:  Dr. Doreene Burke  Time MD responded:  [phone]

## 2016-08-18 LAB — CBC WITH DIFFERENTIAL/PLATELET
BASOS PCT: 0 %
Basophils Absolute: 0 10*3/uL (ref 0.0–0.1)
Eosinophils Absolute: 0.4 10*3/uL (ref 0.0–0.7)
Eosinophils Relative: 3 %
HCT: 20.7 % — ABNORMAL LOW (ref 36.0–46.0)
HEMOGLOBIN: 7.4 g/dL — AB (ref 12.0–15.0)
Lymphocytes Relative: 18 %
Lymphs Abs: 2.7 10*3/uL (ref 0.7–4.0)
MCH: 30.8 pg (ref 26.0–34.0)
MCHC: 35.7 g/dL (ref 30.0–36.0)
MCV: 86.3 fL (ref 78.0–100.0)
MONOS PCT: 16 %
Monocytes Absolute: 2.3 10*3/uL — ABNORMAL HIGH (ref 0.1–1.0)
NEUTROS PCT: 63 %
Neutro Abs: 9.1 10*3/uL — ABNORMAL HIGH (ref 1.7–7.7)
Platelets: 409 10*3/uL — ABNORMAL HIGH (ref 150–400)
RBC: 2.4 MIL/uL — AB (ref 3.87–5.11)
RDW: 20 % — ABNORMAL HIGH (ref 11.5–15.5)
WBC: 14.5 10*3/uL — AB (ref 4.0–10.5)

## 2016-08-18 LAB — GLUCOSE, CAPILLARY
GLUCOSE-CAPILLARY: 86 mg/dL (ref 65–99)
Glucose-Capillary: 85 mg/dL (ref 65–99)

## 2016-08-18 NOTE — Progress Notes (Signed)
Discharge papers given to patient, understood d/c. Dsg to R leg done earlier.

## 2016-08-18 NOTE — Discharge Summary (Signed)
Physician Discharge Summary  Bahja Bence ULA:[id number] DOB: 11-23-1970 DOA: 08/15/2016  PCP: [name], FNP  Admit date: 08/15/2016  Discharge date: 08/18/2016  Discharge Diagnoses:  Principal Problem:   Sickle cell pain crisis ([place]) Active Problems:   Chronic pain syndrome   Ulcers of both lower legs ([place])  Discharge Condition: Hemodynamically Stable Disposition:  Follow-up Information    [facility] Follow up.   Specialty:  Emergency Medicine Why:  If symptoms worsen Contact information: [place] [id number] [place] [phone]       Call [name], FNP.   Specialty:  Family Medicine Contact information: [place]. [place] [address] [phone]         Diet: Regular Wt Readings from Last 3 Encounters:  08/17/16 53.5 kg (118 lb)  06/14/16 54.7 kg (120 lb 9.6 oz)  05/31/16 54.4 kg (120 lb)   History of present illness:  [name] is a 46 y.o. female with a past medical history significant for sickle cell disease and chronic leg ulcer who presents with complaints of sickle cell pain.  The patient was in her usual state of health until a few days ago when she developed constant, aching severe pain in her right arm, from the shoulder down.  She tried taking her home oxycodone and ibuprofen, but this did not help and so tonight she came to the ER.  ED course: -Afebrile, heart rate 69, respirations and 24, pulse oximetry 94% on room air, dips with hydromorphone, blood pressure 133/87 -Na 135, K 3.7, Cr 0.39, WBC 20.8K, Hgb 7.6 -BNP normal, troponin negative -Pregnancy test negative -Chest x-ray clear -ECG showed sinus rhythm -She was given hydromorphone per protocol but still rated her pain as 8 out of 10 and so TRH were asked to evaluate for admission for sickel cell pain crisis  Hospital Course:  Patient was admitted for Sickle Cell pain crisis  and managed with standard sickle cell pain management protocol including IV Dilaudid via PCA, IV Ketorolac, IVF and other adjunct therapies. Her Hb dropped to 6.3, she was given 1 unit of PRBC and Hb improved to 7.4. Pain responded to treatment and returned to baseline of 3/10. She was ambulating well without SOB or desaturation. Patient was hemodynamically stable and was discharged home to follow up with PCP within one week as scheduled. She will continue her home medications and follow up with wound care clinic as scheduled.  Discharge Exam: Vitals:   08/18/16 0938 08/18/16 1140  BP: 113/71   Pulse: (!) 59   Resp: 12 12  Temp: 98.4 F (36.9 C)    Vitals:   08/18/16 0518 08/18/16 0613 08/18/16 0938 08/18/16 1140  BP: 129/71  113/71   Pulse: 61  (!) 59   Resp: 12 12 12 12   Temp: 98.1 F (36.7 C)  98.4 F (36.9 C)   TempSrc: Oral  Oral   SpO2: 100% 98% 100% 100%  Weight:      Height:       General appearance : Awake, alert, not in any distress. Speech Clear. Not toxic looking HEENT: Atraumatic and Normocephalic, pupils equally reactive to light and accomodation Neck: Supple, no JVD. No cervical lymphadenopathy.  Chest: Good air entry bilaterally, no added sounds  CVS: S1 S2 regular, no murmurs.  Abdomen: Bowel sounds present, Non tender and not distended with no gaurding, rigidity or rebound. Extremities: B/L Lower Ext shows no edema.  The right arm and  right ankle are both wrapped in ACE bandage Neurology: Awake alert, and oriented X 3, CN II-XII intact, Non focal  Discharge Instructions  Discharge Instructions    Diet - low sodium heart healthy    Complete by:  As directed    Increase activity slowly    Complete by:  As directed      Allergies as of 08/18/2016   No Known Allergies     Medication List    STOP taking these medications   ciprofloxacin 500 MG tablet Commonly known as:  CIPRO     TAKE these medications   folic acid 1 MG tablet Commonly known as:   FOLVITE Take 1 tablet (1 mg total) by mouth daily.   hydrOXYzine 10 MG tablet Commonly known as:  ATARAX/VISTARIL Take 1 tablet (10 mg total) by mouth 3 (three) times daily as needed for itching.   ibuprofen 600 MG tablet Commonly known as:  ADVIL,MOTRIN Take 1 tablet (600 mg total) by mouth every 8 (eight) hours as needed.   ketoconazole 2 % cream Commonly known as:  NIZORAL Apply 1 application topically daily.   methylPREDNISolone 4 MG tablet Commonly known as:  MEDROL Take 8 mg by mouth daily.   Oxycodone HCl 10 MG Tabs Take 1 tablet (10 mg total) by mouth every 4 (four) hours as needed (pain).   Vitamin D 2000 units Caps Take 2 capsules (4,000 Units total) by mouth daily.      The results of significant diagnostics from this hospitalization (including imaging, microbiology, ancillary and laboratory) are listed below for reference.    Significant Diagnostic Studies: Dg Chest 2 View  Result Date: 08/15/2016 CLINICAL DATA:  Sickle cell pain crisis, acute onset. Right arm pain, extending down the right side of the torso. Initial encounter. EXAM: CHEST  2 VIEW COMPARISON:  Chest radiograph performed 10/16/2012, and CT of the chest performed 10/18/2012 FINDINGS: The lungs are well-aerated. Mild vascular congestion is noted. There is no evidence of focal opacification, pleural effusion or pneumothorax. The heart is mildly enlarged. No acute osseous abnormalities are seen. IMPRESSION: Mild vascular congestion and mild cardiomegaly. Lungs remain grossly clear. Electronically Signed   By: Garald Balding M.D.   On: 08/15/2016 20:55   Microbiology: No results found for this or any previous visit (from the past 240 hour(s)).   Labs: Basic Metabolic Panel:  Recent Labs Lab 08/15/16 1956  NA 135  K 3.7  CL 102  CO2 27  GLUCOSE 93  BUN 8  CREATININE 0.39*  CALCIUM 8.8*   Liver Function Tests:  Recent Labs Lab 08/15/16 1956  AST 33  ALT 19  ALKPHOS 53  BILITOT 3.9*   PROT 8.2*  ALBUMIN 4.3   No results for input(s): LIPASE, AMYLASE in the last 168 hours. No results for input(s): AMMONIA in the last 168 hours. CBC:  Recent Labs Lab 08/15/16 1956 08/17/16 0705 08/18/16 0342  WBC 20.8* 12.8* 14.5*  NEUTROABS 17.2* 8.5* 9.1*  HGB 7.6* 6.3* 7.4*  HCT 21.2* 17.7* 20.7*  MCV 89.8 87.6 86.3  PLT 542* 439* 409*   Cardiac Enzymes: No results for input(s): CKTOTAL, CKMB, CKMBINDEX, TROPONINI in the last 168 hours. BNP: Invalid input(s): POCBNP CBG:  Recent Labs Lab 08/17/16 1235 08/17/16 1745 08/17/16 2150 08/18/16 0751 08/18/16 1129  GLUCAP 99 113* 132* 86 85   Time coordinating discharge: 50 minutes  Signed:  Azul Brumett, [facility] 08/18/2016, 12:26 PM

## 2016-08-18 NOTE — Progress Notes (Signed)
Patient discharge home. Stable.

## 2016-08-18 NOTE — Discharge Instructions (Signed)
Please take home pain medications.  Follow-up closely with your sickle cell doctor and wound care clinic

## 2016-08-19 LAB — TYPE AND SCREEN
ABO/RH(D): B POS
Antibody Screen: NEGATIVE
Unit division: 0

## 2016-08-19 LAB — BPAM RBC
Blood Product Expiration Date: 201804202359
ISSUE DATE / TIME: 201803311403
Unit Type and Rh: 5100

## 2016-08-29 ENCOUNTER — Encounter: Payer: Self-pay | Admitting: Family Medicine

## 2016-08-29 ENCOUNTER — Ambulatory Visit (INDEPENDENT_AMBULATORY_CARE_PROVIDER_SITE_OTHER): Payer: Medicaid Other | Admitting: Family Medicine

## 2016-08-29 VITALS — BP 120/65 | HR 75 | Temp 98.5°F | Resp 14 | Ht 67.0 in | Wt 117.0 lb

## 2016-08-29 DIAGNOSIS — D571 Sickle-cell disease 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: Secondary | ICD-10-CM

## 2016-08-29 DIAGNOSIS — L97919 Non-pressure chronic ulcer of unspecified part of right lower leg with unspecified severity: Secondary | ICD-10-CM

## 2016-08-29 DIAGNOSIS — L97929 Non-pressure chronic ulcer of unspecified part of left lower leg with unspecified severity: Secondary | ICD-10-CM

## 2016-08-29 DIAGNOSIS — G894 Chronic pain syndrome: Secondary | ICD-10-CM | POA: Diagnosis not present

## 2016-08-29 LAB — COMPLETE METABOLIC PANEL WITH GFR
ALBUMIN: 4.4 g/dL (ref 3.6–5.1)
ALK PHOS: 49 U/L (ref 33–115)
ALT: 14 U/L (ref 6–29)
AST: 21 U/L (ref 10–35)
BUN: 8 mg/dL (ref 7–25)
CO2: 25 mmol/L (ref 20–31)
Calcium: 9.7 mg/dL (ref 8.6–10.2)
Chloride: 104 mmol/L (ref 98–110)
Creat: 0.34 mg/dL — ABNORMAL LOW (ref 0.50–1.10)
GFR, Est African American: >89 mL/min (ref >=60)
GLUCOSE: 97 mg/dL (ref 65–99)
POTASSIUM: 3.9 mmol/L (ref 3.5–5.3)
SODIUM: 138 mmol/L (ref 135–146)
Total Bilirubin: 3.8 mg/dL — ABNORMAL HIGH (ref 0.2–1.2)
Total Protein: 7.7 g/dL (ref 6.1–8.1)

## 2016-08-29 LAB — POCT URINALYSIS DIP (DEVICE)
BILIRUBIN URINE: NEGATIVE
GLUCOSE, UA: NEGATIVE mg/dL
Hgb urine dipstick: NEGATIVE
Ketones, ur: NEGATIVE mg/dL
LEUKOCYTES UA: NEGATIVE
NITRITE: NEGATIVE
Protein, ur: NEGATIVE mg/dL
Specific Gravity, Urine: 1.015 (ref 1.005–1.030)
UROBILINOGEN UA: 0.2 mg/dL (ref 0.0–1.0)
pH: 7 (ref 5.0–8.0)

## 2016-08-29 MED ORDER — OXYCODONE HCL 10 MG PO TABS: 10.0000 mg | ORAL_TABLET | ORAL | 0 refills | Status: DC | PRN

## 2016-08-29 MED ORDER — L-GLUTAMINE ORAL POWDER: 5.0000 g | PACK | Freq: Two times a day (BID) | ORAL | 5 refills | Status: DC

## 2016-08-29 MED ORDER — IBUPROFEN 600 MG PO TABS: 600.0000 mg | ORAL_TABLET | Freq: Three times a day (TID) | ORAL | 1 refills | Status: DC | PRN

## 2016-08-29 MED FILL — IBUPROFEN 600 MG TABLET: 600 | 20 days supply | Qty: 60 | Fill #0

## 2016-08-29 MED FILL — ENDARI 5 GM PKG: 5 | 30 days supply | Qty: 60 | Fill #0

## 2016-08-29 NOTE — Patient Instructions (Addendum)
Sickle cell anemia;   Will start a trial of Endari 5 mg twice daily as discussed.   The findings showed a 25% reduction in the frequency of sickle-cell crises, a 33% decrease in hospitalisation rates, fewer hospital visits due to sickle-cell pain and 60% fewer occurrences of acute chest syndrome in patients administered with Endari.      Sickle Cell Anemia, Adult Sickle cell anemia is a condition where your red blood cells are shaped like sickles. Red blood cells carry oxygen through the body. Sickle-shaped red blood cells do not live as long as normal red blood cells. They also clump together and block blood from flowing through the blood vessels. These things prevent the body from getting enough oxygen. Sickle cell anemia causes organ damage and pain. It also increases the risk of infection. Follow these instructions at home:  Drink enough fluid to keep your pee (urine) clear or pale yellow. Drink more in hot weather and during exercise.  Do not smoke. Smoking lowers oxygen levels in the blood.  Only take over-the-counter or prescription medicines as told by your doctor.  Take antibiotic medicines as told by your doctor. Make sure you finish them even if you start to feel better.  Take supplements as told by your doctor.  Consider wearing a medical alert bracelet. This tells anyone caring for you in an emergency of your condition.  When traveling, keep your medical information, doctors' names, and the medicines you take with you at all times.  If you have a fever, do not take fever medicines right away. This could cover up a problem. Tell your doctor.  Keep all follow-up visits with your doctor. Sickle cell anemia requires regular medical care. Contact a doctor if: You have a fever. Get help right away if:  You feel dizzy or faint.  You have new belly (abdominal) pain, especially on the left side near the stomach area.  You have a lasting, often uncomfortable and painful  erection of the penis (priapism). If it is not treated right away, you will become unable to have sex (impotence).  You have numbness in your arms or legs or you have a hard time moving them.  You have a hard time talking.  You have a fever or lasting symptoms for more than 2-3 days.  You have a fever and your symptoms suddenly get worse.  You have signs or symptoms of infection. These include:  Chills.  Being more tired than normal (lethargy).  Irritability.  Poor eating.  Throwing up (vomiting).  You have pain that is not helped with medicine.  You have shortness of breath.  You have pain in your chest.  You are coughing up pus-like or bloody mucus.  You have a stiff neck.  Your feet or hands swell or have pain.  Your belly looks bloated.  Your joints hurt. This information is not intended to replace advice given to you by your health care provider. Make sure you discuss any questions you have with your health care provider. Document Released: 02/24/2013 Document Revised: 10/12/2015 Document Reviewed: 12/16/2012 Elsevier Interactive Patient Education  2017 [employer].

## 2016-08-29 NOTE — Progress Notes (Signed)
Subjective:    Patient ID: [name], female    DOB: [birth year], 46 y.o.   MRN: [id number]  Medication Refill  Associated symptoms include myalgias. Pertinent negatives include no fever.   [name], a 46 year old female with a history of sickle cell anemia, HbSS and chronic leg ulcers  presents for a post hospital follow-up. [name] was admitted to inpatient services for a sickle cell pain crisis on 08/15/2016. She was treated with IV pain medications and fluids throughout hospital course. She says that pain intensity improved. Today her pain intensity is 2-3/10. Pain is controlled on Oxycodone 10 mg and it was last taken this am. She continues to follow with wound care for  right lower extremity non pressure ulcer. She was last evaluated by wound care on 08/21/2016. She was told to continue to was ound with soap and water daily. She is also applying santyl and following weekly.   She currently denies headache, chest pain, shortness of breath, nausea, vomiting diarrhea, or constipation.    Past Medical History:  Diagnosis Date  . Anemia 03/30/2012   Hx of sickle cell disease  . Anemia complicating pregnancy 07/18/6008  . CAP (community acquired pneumonia)    2014  . Cholelithiasis y-1  . Cholelithiasis   . Elevated LFTs   . Leg ulcer ([place]) 10/27/2012   Chronic under care of wound clinic  . Multiple open wounds of lower extremity    chronic wounds B/LLE  . Sickle cell disease ([place])    Immunization History  Administered Date(s) Administered  . Influenza,inj,Quad PF,36+ Mos 03/16/2013, 01/20/2014, 03/01/2015, 02/19/2016  . Pneumococcal Conjugate-13 12/14/2015  . Pneumococcal Polysaccharide-23 03/24/2014  . Tdap 11/25/2013   Social History   Social History  . Marital status: Married    Spouse name: [name]  . Number of children: 1  . Years of education: N/A   Occupational History  . Employed in home care.    Social History Main Topics  . Smoking status:  Never Smoker  . Smokeless tobacco: Never Used  . Alcohol use No  . Drug use: No  . Sexual activity: Not Currently    Birth control/ protection: None   Other Topics Concern  . Not on file   Social History Narrative   Lives with husband.  No Known Allergies  Review of Systems  Constitutional: Negative for fever.  HENT: Negative.   Eyes: Negative for visual disturbance.  Respiratory: Negative.   Cardiovascular: Negative.   Gastrointestinal: Negative.   Endocrine: Negative.  Negative for polydipsia, polyphagia and polyuria.  Musculoskeletal: Positive for myalgias.       Pain to right lower extremity  Skin: Negative.        Right lower extremity ulcers  Neurological: Negative.   Hematological: Negative.   Psychiatric/Behavioral: Negative.        Objective:   Physical Exam  Constitutional: She is oriented to person, place, and time. She appears well-nourished.  HENT:  Head: Normocephalic and atraumatic.  Right Ear: External ear normal.  Left Ear: External ear normal.  Nose: Nose normal.  Mouth/Throat: Oropharynx is clear and moist.  Eyes: Conjunctivae and EOM are normal. Pupils are equal, round, and reactive to light.  Neck: Normal range of motion. Neck supple.  Cardiovascular: Normal rate, normal heart sounds and intact distal pulses.   Pulmonary/Chest: Effort normal and breath sounds normal.  Abdominal: Soft. Bowel sounds are normal.  Musculoskeletal: Normal range of motion.  Neurological: She is alert and oriented  to person, place, and time. She has normal reflexes.  Skin: Skin is warm and dry.  Bilateral lower extremity ulcers, unable to examine due to dressings.    Psychiatric: She has a normal mood and affect. Her behavior is normal. Judgment and thought content normal.     BP 120/65 (BP Location: Right Arm, Patient Position: Sitting, Cuff Size: Normal)   Pulse 75   Temp 98.5 F (36.9 C) (Oral)   Resp 14   Ht 5\' 7"  (1.702 m)   Wt 117 lb (53.1 kg)   LMP  08/13/2016 Comment: patient double shielded  SpO2 95%   BMI 18.32 kg/m      Assessment & Plan:  1. Hb-SS disease 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 ([place])  Will continue folic acid 1 mg daily to prevent aplastic bone marrow crises.  Discussed starting Endari for prevention of vasoocclusive sickle cell crisis at length. Patient was also provided written information.  The findings showed a 25% reduction in the frequency of sickle-cell crises, a 33% decrease in hospitalisation rates, fewer hospital visits due to sickle-cell pain and 60% fewer occurrences of acute chest syndrome in patients administered with Endari.   Eye - High risk of proliferative retinopathy. Annual eye exam with retinal exam recommended to patient. Last eye examination in April 2017. She states that she has an appointment scheduled  Immunization status - Patient is up to date with vaccinations   Acute and chronic painful episodes -  Will continue Oxycodone every 4 hours as needed for moderate to severe chronic pain. [name] has been having increased pain since stasis ulcers have resurfaced.  We will discuss titration of current dosage during next office visit.   We discussed that pt is to receive her Schedule II prescriptions only from [place]. Pt is also aware that the prescription history is available to [place] online through the [facility] CSRS. Controlled substance agreement signed previously. We reminded [name] that all patients receiving Schedule II narcotics must be seen for follow within one month of prescription being requested. We reviewed the terms of our pain agreement, including the need to keep medicines in a safe locked location away from children or pets, and the need to report excess sedation or constipation, measures to avoid constipation, and policies related to early refills and stolen prescriptions. According to the [place] [facility] program, we have reviewed details related to analgesia, adverse effects, aberrant behaviors.  Reviewed [place] Substance Reporting system prior to prescribing opiate medications.  - ibuprofen (ADVIL,MOTRIN) 600 MG tablet; Take 1 tablet (600 mg total) by mouth every 8 (eight) hours as needed.  Dispense: 30 tablet; Refill: 0 - CBC with Differential - COMPLETE METABOLIC PANEL WITH GFR - Oxycodone HCl 10 MG TABS; Take 1 tablet (10 mg total) by mouth every 4 (four) hours as needed (pain).  Dispense: 90 tablet; Refill: 0  - Oxycodone HCl 10 MG TABS; Take 1 tablet (10 mg total) by mouth every 4 (four) hours as needed (pain).  Dispense: 90 tablet; Refill: 0 - ibuprofen (ADVIL,MOTRIN) 600 MG tablet; Take 1 tablet (600 mg total) by mouth every 8 (eight) hours as needed.  Dispense: 60 tablet; Refill: 1 - COMPLETE METABOLIC PANEL WITH GFR - CBC with Differential - L-glutamine (ENDARI) 5 g PACK Powder Packet; Take 5 g by mouth 2 (two) times daily.  Dispense: 60 packet; Refill: 5  2. Chronic pain syndrome  - Oxycodone HCl 10 MG TABS; Take 1 tablet (10 mg total) by mouth every 4 (four) hours as  needed (pain).  Dispense: 90 tablet; Refill: 0  3. Ulcers of both lower legs ([place]) Continue to follow up with wound care as scheduled   RTC: 1 month for medication management   [name]  MSN, FNP-C [facility] [place], [place] [address] [phone]

## 2016-08-30 LAB — CBC WITH DIFFERENTIAL/PLATELET
BASOS ABS: 0 {cells}/uL (ref 0–200)
BASOS PCT: 0 %
EOS PCT: 1 %
Eosinophils Absolute: 186 cells/uL (ref 15–500)
HCT: 24.6 % — ABNORMAL LOW (ref 35.0–45.0)
HEMOGLOBIN: 8.2 g/dL — AB (ref 11.7–15.5)
LYMPHS ABS: 4464 {cells}/uL — AB (ref 850–3900)
Lymphocytes Relative: 24 %
MCH: 30.4 pg (ref 27.0–33.0)
MCHC: 33.3 g/dL (ref 32.0–36.0)
MCV: 91.1 fL (ref 80.0–100.0)
MPV: 9.4 fL (ref 7.5–12.5)
Monocytes Absolute: 2418 cells/uL — ABNORMAL HIGH (ref 200–950)
Monocytes Relative: 13 %
NEUTROS ABS: 11532 {cells}/uL — AB (ref 1500–7800)
Neutrophils Relative %: 62 %
Platelets: 476 10*3/uL — ABNORMAL HIGH (ref 140–400)
RBC: 2.7 MIL/uL — AB (ref 3.80–5.10)
RDW: 21.1 % — AB (ref 11.0–15.0)
WBC: 18.6 10*3/uL — AB (ref 3.8–10.8)

## 2016-08-30 MED FILL — oxyCODONE HCL 10 MG TABS: 10 | 15 days supply | Qty: 90 | Fill #0

## 2016-09-09 ENCOUNTER — Emergency Department (HOSPITAL_COMMUNITY)
Admission: EM | Admit: 2016-09-09 | Discharge: 2016-09-09 | Disposition: A | Discharge status: Home / Self Care | Payer: Medicaid Other | Source: Home / Self Care | Attending: Emergency Medicine | Admitting: Emergency Medicine

## 2016-09-09 ENCOUNTER — Emergency Department (HOSPITAL_COMMUNITY): Payer: Medicaid Other

## 2016-09-09 ENCOUNTER — Inpatient Hospital Stay (HOSPITAL_COMMUNITY)
Admission: AD | Admit: 2016-09-09 | Discharge: 2016-09-15 | DRG: 812 | Disposition: A | Discharge status: Home / Self Care | Payer: Medicaid Other | Source: Ambulatory Visit | Attending: Internal Medicine | Admitting: Internal Medicine

## 2016-09-09 ENCOUNTER — Encounter (HOSPITAL_COMMUNITY): Payer: Self-pay

## 2016-09-09 DIAGNOSIS — M79606 Pain in leg, unspecified: Secondary | ICD-10-CM | POA: Diagnosis present

## 2016-09-09 DIAGNOSIS — D5700 Hb-SS disease with crisis, unspecified: Secondary | ICD-10-CM | POA: Diagnosis not present

## 2016-09-09 DIAGNOSIS — L97819 Non-pressure chronic ulcer of other part of right lower leg with unspecified severity: Secondary | ICD-10-CM | POA: Diagnosis present

## 2016-09-09 DIAGNOSIS — R0789 Other chest pain: Secondary | ICD-10-CM | POA: Diagnosis present

## 2016-09-09 DIAGNOSIS — L97519 Non-pressure chronic ulcer of other part of right foot with unspecified severity: Secondary | ICD-10-CM | POA: Diagnosis present

## 2016-09-09 DIAGNOSIS — N390 Urinary tract infection, site not specified: Secondary | ICD-10-CM | POA: Diagnosis present

## 2016-09-09 DIAGNOSIS — N3000 Acute cystitis without hematuria: Secondary | ICD-10-CM | POA: Diagnosis not present

## 2016-09-09 DIAGNOSIS — S81801D Unspecified open wound, right lower leg, subsequent encounter: Secondary | ICD-10-CM | POA: Diagnosis not present

## 2016-09-09 DIAGNOSIS — M79603 Pain in arm, unspecified: Secondary | ICD-10-CM | POA: Diagnosis present

## 2016-09-09 DIAGNOSIS — M549 Dorsalgia, unspecified: Secondary | ICD-10-CM | POA: Diagnosis present

## 2016-09-09 DIAGNOSIS — D72829 Elevated white blood cell count, unspecified: Secondary | ICD-10-CM | POA: Diagnosis not present

## 2016-09-09 DIAGNOSIS — R079 Chest pain, unspecified: Secondary | ICD-10-CM

## 2016-09-09 DIAGNOSIS — Z79899 Other long term (current) drug therapy: Secondary | ICD-10-CM

## 2016-09-09 DIAGNOSIS — E876 Hypokalemia: Secondary | ICD-10-CM | POA: Diagnosis present

## 2016-09-09 DIAGNOSIS — L97909 Non-pressure chronic ulcer of unspecified part of unspecified lower leg with unspecified severity: Secondary | ICD-10-CM | POA: Diagnosis not present

## 2016-09-09 DIAGNOSIS — D638 Anemia in other chronic diseases classified elsewhere: Secondary | ICD-10-CM | POA: Diagnosis not present

## 2016-09-09 LAB — URINALYSIS, ROUTINE W REFLEX MICROSCOPIC
Bilirubin Urine: NEGATIVE
GLUCOSE, UA: NEGATIVE mg/dL
KETONES UR: NEGATIVE mg/dL
Nitrite: NEGATIVE
PH: 7 (ref 5.0–8.0)
Protein, ur: NEGATIVE mg/dL
Specific Gravity, Urine: 1.009 (ref 1.005–1.030)

## 2016-09-09 LAB — RETICULOCYTES
RBC.: 2.23 MIL/uL — AB (ref 3.87–5.11)
Retic Ct Pct: >23 % — ABNORMAL HIGH (ref 0.4–3.1)

## 2016-09-09 LAB — BASIC METABOLIC PANEL
Anion gap: 8 (ref 5–15)
BUN: 7 mg/dL (ref 6–20)
CALCIUM: 8.8 mg/dL — AB (ref 8.9–10.3)
CO2: 24 mmol/L (ref 22–32)
CREATININE: 0.34 mg/dL — AB (ref 0.44–1.00)
Chloride: 104 mmol/L (ref 101–111)
GFR calc Af Amer: >60 mL/min (ref >60)
GLUCOSE: 88 mg/dL (ref 65–99)
POTASSIUM: 2.8 mmol/L — AB (ref 3.5–5.1)
Sodium: 136 mmol/L (ref 135–145)

## 2016-09-09 LAB — I-STAT TROPONIN, ED
Troponin i, poc: 0 ng/mL (ref 0.00–0.08)
Troponin i, poc: 0 ng/mL (ref 0.00–0.08)

## 2016-09-09 LAB — CBC
HEMATOCRIT: 19.8 % — AB (ref 36.0–46.0)
Hemoglobin: 7.1 g/dL — ABNORMAL LOW (ref 12.0–15.0)
MCH: 31.4 pg (ref 26.0–34.0)
MCHC: 35.9 g/dL (ref 30.0–36.0)
MCV: 87.6 fL (ref 78.0–100.0)
PLATELETS: 421 10*3/uL — AB (ref 150–400)
RBC: 2.26 MIL/uL — ABNORMAL LOW (ref 3.87–5.11)
RDW: 23 % — AB (ref 11.5–15.5)
WBC: 21.6 10*3/uL — ABNORMAL HIGH (ref 4.0–10.5)

## 2016-09-09 LAB — PROTIME-INR
INR: 1.15
PROTHROMBIN TIME: 14.8 s (ref 11.4–15.2)

## 2016-09-09 LAB — HEPATIC FUNCTION PANEL
ALT: 18 U/L (ref 14–54)
AST: 36 U/L (ref 15–41)
Albumin: 4.3 g/dL (ref 3.5–5.0)
Alkaline Phosphatase: 42 U/L (ref 38–126)
BILIRUBIN DIRECT: 0.4 mg/dL (ref 0.1–0.5)
BILIRUBIN INDIRECT: 4.5 mg/dL — AB (ref 0.3–0.9)
TOTAL PROTEIN: 7.1 g/dL (ref 6.5–8.1)
Total Bilirubin: 4.9 mg/dL — ABNORMAL HIGH (ref 0.3–1.2)

## 2016-09-09 LAB — I-STAT BETA HCG BLOOD, ED (MC, WL, AP ONLY)

## 2016-09-09 LAB — PREGNANCY, URINE: Preg Test, Ur: NEGATIVE

## 2016-09-09 LAB — LACTATE DEHYDROGENASE: LDH: 318 U/L — AB (ref 98–192)

## 2016-09-09 MED ORDER — DEXTROSE-NACL 5-0.45 % IV SOLN
INTRAVENOUS | Status: DC
  Administered 2016-09-09: 07:00:00 via INTRAVENOUS

## 2016-09-09 MED ORDER — PROMETHAZINE HCL 25 MG/ML IJ SOLN
25.0000 mg | Freq: Four times a day (QID) | INTRAMUSCULAR | Status: DC | PRN
Start: 2016-09-09 — End: 2016-09-15

## 2016-09-09 MED ORDER — KETOROLAC TROMETHAMINE 30 MG/ML IJ SOLN
30.0000 mg | Freq: Four times a day (QID) | INTRAMUSCULAR | Status: AC
  Administered 2016-09-09 – 2016-09-14 (×20): 30 mg via INTRAVENOUS
  Filled 2016-09-09 (×20): qty 1

## 2016-09-09 MED ORDER — L-GLUTAMINE ORAL POWDER
5.0000 g | PACK | Freq: Two times a day (BID) | ORAL | Status: DC
  Administered 2016-09-09 – 2016-09-15 (×12): 5 g via ORAL
  Filled 2016-09-09 (×13): qty 1

## 2016-09-09 MED ORDER — HYDROMORPHONE HCL 1 MG/ML IJ SOLN
1.0000 mg | Freq: Once | INTRAMUSCULAR | Status: AC
  Administered 2016-09-09: 1 mg via INTRAVENOUS
  Filled 2016-09-09: qty 1

## 2016-09-09 MED ORDER — PANTOPRAZOLE SODIUM 40 MG PO TBEC
40.0000 mg | DELAYED_RELEASE_TABLET | Freq: Every day | ORAL | Status: DC
  Administered 2016-09-10 – 2016-09-15 (×6): 40 mg via ORAL
  Filled 2016-09-09 (×7): qty 1

## 2016-09-09 MED ORDER — HYDROMORPHONE 1 MG/ML IV SOLN
INTRAVENOUS | Status: DC
  Administered 2016-09-09: 13:00:00 via INTRAVENOUS
  Filled 2016-09-09: qty 25

## 2016-09-09 MED ORDER — DEXTROSE-NACL 5-0.45 % IV SOLN
INTRAVENOUS | Status: DC
  Administered 2016-09-09: 13:00:00 via INTRAVENOUS

## 2016-09-09 MED ORDER — HYDROMORPHONE HCL 1 MG/ML IJ SOLN
0.5000 mg | Freq: Once | INTRAMUSCULAR | Status: AC | PRN
  Administered 2016-09-09: 1 mg via INTRAVENOUS

## 2016-09-09 MED ORDER — POLYETHYLENE GLYCOL 3350 17 G PO PACK
17.0000 g | PACK | Freq: Every day | ORAL | Status: DC | PRN
  Administered 2016-09-12 – 2016-09-14 (×4): 17 g via ORAL
  Filled 2016-09-09 (×4): qty 1

## 2016-09-09 MED ORDER — HYDROMORPHONE HCL 1 MG/ML IJ SOLN
0.5000 mg | INTRAMUSCULAR | Status: AC
  Administered 2016-09-09: 1 mg via INTRAVENOUS
  Filled 2016-09-09 (×2): qty 1

## 2016-09-09 MED ORDER — NALOXONE HCL 0.4 MG/ML IJ SOLN: 0.4000 mg | INTRAMUSCULAR | Status: DC | PRN

## 2016-09-09 MED ORDER — POTASSIUM CHLORIDE CRYS ER 10 MEQ PO TBCR
30.0000 meq | EXTENDED_RELEASE_TABLET | Freq: Once | ORAL | Status: AC
  Administered 2016-09-09: 30 meq via ORAL
  Filled 2016-09-09: qty 1

## 2016-09-09 MED ORDER — DIPHENHYDRAMINE HCL 25 MG PO CAPS
25.0000 mg | ORAL_CAPSULE | ORAL | Status: DC | PRN
  Administered 2016-09-09: 50 mg via ORAL
  Filled 2016-09-09: qty 2

## 2016-09-09 MED ORDER — ONDANSETRON HCL 4 MG/2ML IJ SOLN: 4.0000 mg | Freq: Four times a day (QID) | INTRAMUSCULAR | Status: DC | PRN

## 2016-09-09 MED ORDER — SENNOSIDES-DOCUSATE SODIUM 8.6-50 MG PO TABS
1.0000 | ORAL_TABLET | Freq: Two times a day (BID) | ORAL | Status: DC
  Administered 2016-09-09 – 2016-09-15 (×10): 1 via ORAL
  Filled 2016-09-09 (×10): qty 1

## 2016-09-09 MED ORDER — HYDROMORPHONE 1 MG/ML IV SOLN: INTRAVENOUS | Status: DC

## 2016-09-09 MED ORDER — SODIUM CHLORIDE 0.9 % IV SOLN
25.0000 mg | INTRAVENOUS | Status: DC | PRN
  Filled 2016-09-09: qty 0.5

## 2016-09-09 MED ORDER — SODIUM CHLORIDE 0.9% FLUSH: 9.0000 mL | INTRAVENOUS | Status: DC | PRN

## 2016-09-09 MED ORDER — PANTOPRAZOLE SODIUM 40 MG IV SOLR
40.0000 mg | Freq: Every day | INTRAVENOUS | Status: DC
  Administered 2016-09-09: 40 mg via INTRAVENOUS
  Filled 2016-09-09 (×2): qty 40

## 2016-09-09 MED ORDER — KETOROLAC TROMETHAMINE 30 MG/ML IJ SOLN
30.0000 mg | INTRAMUSCULAR | Status: AC
  Administered 2016-09-09: 30 mg via INTRAVENOUS
  Filled 2016-09-09: qty 1

## 2016-09-09 MED ORDER — ENOXAPARIN SODIUM 40 MG/0.4ML ~~LOC~~ SOLN
40.0000 mg | SUBCUTANEOUS | Status: DC
  Administered 2016-09-09 – 2016-09-14 (×6): 40 mg via SUBCUTANEOUS
  Filled 2016-09-09 (×6): qty 0.4

## 2016-09-09 MED ORDER — DEXTROSE-NACL 5-0.45 % IV SOLN
INTRAVENOUS | Status: DC
  Administered 2016-09-09 – 2016-09-13 (×4): via INTRAVENOUS

## 2016-09-09 NOTE — H&P (Signed)
[facility] History and Physical   Date: 09/09/2016  Patient name: [name] Medical record number: [id number] Date of birth: 02-27-1971 Age: 46 y.o. Gender: female PCP: [name], FNP  Attending physician: Tresa Garter, MD  Chief Complaint: Pain   History of Present Illness: [name] is a 46 y.o. female presents to the day infusion center after being transferred from [facility] Emergency department for pain related to Sickle Cell crisis. Kea reports pain is generalized from her right arm, back , upper chest and right leg. Pain intensity is 10/10.  She was administered h 4 mg of Hydromorphone and Toradol 30 mg IV while in the Emergency department and reports no improvement of pain.Received a call from Marita Kansas, NP that patient was cleared medically in the ED and needs ongoing pain management.  Shirlean Mylar reported that patient is hypokalemic and has been treated with KDUR 30 MEQ once. Chest x-ray was negative of effusion or infiltrates. The patient was accepted into the day infusion for management of acute pain likely related to an acute sickle cell crisis. Pain denies dysuria, shortness of breath, headache, abdominal pain, nausea, or vomiting.   Meds: Prescriptions Prior to Admission  Medication Sig Dispense Refill Last Dose  . Cholecalciferol (VITAMIN D) 2000 units CAPS Take 2 capsules (4,000 Units total) by mouth daily. 30 capsule 11 09/08/2016 at Unknown time  . collagenase (SANTYL) ointment Apply 1 application topically daily. Pt applies to right foot.   09/08/2016 at Unknown time  . folic acid (FOLVITE) 1 MG tablet Take 1 tablet (1 mg total) by mouth daily. 30 tablet 11 09/08/2016 at Unknown time  . ibuprofen (ADVIL,MOTRIN) 600 MG tablet Take 1 tablet (600 mg total) by mouth every 8 (eight) hours as needed. (Patient taking differently: Take 600 mg by mouth every 8 (eight) hours as needed for mild pain or moderate pain. ) 60 tablet 1 09/08/2016 at  0900  . L-glutamine (ENDARI) 5 g PACK Powder Packet Take 5 g by mouth 2 (two) times daily. 60 packet 5 09/08/2016 at Unknown time  . Oxycodone HCl 10 MG TABS Take 1 tablet (10 mg total) by mouth every 4 (four) hours as needed (pain). 90 tablet 0 09/09/2016 at 0300    Allergies: Patient has no known allergies. Past Medical History:  Diagnosis Date  . Anemia 03/30/2012   Hx of sickle cell disease  . Anemia complicating pregnancy 08/27/7351  . CAP (community acquired pneumonia)    2014  . Cholelithiasis y-1  . Cholelithiasis   . Elevated LFTs   . Leg ulcer ([place]) 10/27/2012   Chronic under care of wound clinic  . Multiple open wounds of lower extremity    chronic wounds B/LLE  . Sickle cell disease ([place])    Past Surgical History:  Procedure Laterality Date  . ALLOGRAFT APPLICATION Right 2/99/2426   Procedure: SURGICAL PREP FOR GRAFTING RIGHT LOWER EXTREMITY AND APPLICATION OF Jannifer Hick;  Surgeon: Irene Limbo, MD;  Location: [place];  Service: Plastics;  Laterality: Right;  . APPLICATION OF A-CELL OF EXTREMITY Right 10/09/2015   Procedure: APPLICATION OF Jannifer Hick;  Surgeon: Irene Limbo, MD;  Location: [place];  Service: Plastics;  Laterality: Right;  . BREAST SURGERY     left breast cyst aspiration  . CESAREAN SECTION    . CESAREAN SECTION N/A 01/06/2014   Procedure: CESAREAN SECTION;  Surgeon: Emily Filbert, MD;  Location: [facility];  Service: Obstetrics;  Laterality: N/A;  . I&D  EXTREMITY Right 10/09/2015   Procedure: SURGICAL PREPARATION FOR GRAFTING RIGHT ANKLE AND APPLICATION THERASKIN;  Surgeon: Irene Limbo, MD;  Location: [place];  Service: Plastics;  Laterality: Right;  . SKIN FULL THICKNESS GRAFT Bilateral 06/17/2016   Procedure: SURGICAL PREP FOR GRAFTING, BILATERAL LOWER EXTREMITIES AND APPLICATION OF Jannifer Hick;  Surgeon: Irene Limbo, MD;  Location: [place];  Service: Plastics;  Laterality: Bilateral;  .  TONSILLECTOMY     Family History  Problem Relation Age of Onset  . Sickle cell anemia Sister   . Diabetes Mother   . Asthma Mother   . Sickle cell trait Mother   . Sickle cell trait Father   . Hypertension Father    Social History   Social History  . Marital status: Married    Spouse name: [name]  . Number of children: 1  . Years of education: N/A   Occupational History  . Employed in home care.    Social History Main Topics  . Smoking status: Never Smoker  . Smokeless tobacco: Never Used  . Alcohol use No  . Drug use: No  . Sexual activity: Not Currently    Birth control/ protection: None   Other Topics Concern  . Not on file   Social History Narrative   Lives with husband.    Review of Systems: Constitutional: negative  Respiratory:negative  Cardiovascular: negative  Gastrointestinal: negative  Musculoskeletal: pain   Physical Exam: Blood pressure (!) 150/74, pulse 78, temperature 98.4 F (36.9 C), temperature source Oral, resp. rate (!) 22, height 5\' 7"  (1.702 m), weight 124 lb (56.2 kg), last menstrual period 08/12/2016, SpO2 95 %. Last menstrual period 08/13/2016. BP (!) 150/74 (BP Location: Right Arm, Patient Position: Supine, Cuff Size: Normal)   Pulse 78   Temp 98.4 F (36.9 C) (Oral)   Resp (!) 22   Ht 5\' 7"  (1.702 m)   Wt 124 lb (56.2 kg)   LMP 08/12/2016 Comment: patient double shielded  SpO2 95%   BMI 19.42 kg/m   General Appearance:    Alert, cooperative, no distress, appears stated age  Head:    Normocephalic, without obvious abnormality, atraumatic  Eyes:    PERRL, conjunctiva/corneas clear, EOM's intact  Back:     Symmetric, no curvature, ROM normal, no CVA tenderness  Lungs:     Clear to auscultation bilaterally, respirations unlabored  Chest Wall:    Subjective upper chest wall tenderness-negative chest ray today   Heart:    Regular rate and rhythm, S1 and S2 normal, no murmur, rub   or gallop  Abdomen:     Soft,  non-tender, bowel sounds active all four quadrants,    no masses, no organomegaly  Extremities:   Extremities normal, atraumatic, no cyanosis or edema  Pulses:   2+ and symmetric all extremities  Skin:   Skin color, texture, turgor normal, no rashes or lesions  Neurologic:   Normal strength    Lab results: Results for orders placed or performed during the hospital encounter of 09/09/16 (from the past 24 hour(s))  Basic metabolic panel     Status: Abnormal   Collection Time: 09/09/16  6:47 AM  Result Value Ref Range   Sodium 136 135 - 145 mmol/L   Potassium 2.8 (L) 3.5 - 5.1 mmol/L   Chloride 104 101 - 111 mmol/L   CO2 24 22 - 32 mmol/L   Glucose, Bld 88 65 - 99 mg/dL   BUN 7 6 - 20 mg/dL  Creatinine, Ser 0.34 (L) 0.44 - 1.00 mg/dL   Calcium 8.8 (L) 8.9 - 10.3 mg/dL   GFR calc non Af Amer >60 >60 mL/min   GFR calc Af Amer >60 >60 mL/min   Anion gap 8 5 - 15  CBC     Status: Abnormal   Collection Time: 09/09/16  6:47 AM  Result Value Ref Range   WBC 21.6 (H) 4.0 - 10.5 K/uL   RBC 2.26 (L) 3.87 - 5.11 MIL/uL   Hemoglobin 7.1 (L) 12.0 - 15.0 g/dL   HCT 19.8 (L) 36.0 - 46.0 %   MCV 87.6 78.0 - 100.0 fL   MCH 31.4 26.0 - 34.0 pg   MCHC 35.9 30.0 - 36.0 g/dL   RDW 23.0 (H) 11.5 - 15.5 %   Platelets 421 (H) 150 - 400 K/uL  Hepatic function panel     Status: Abnormal   Collection Time: 09/09/16  6:47 AM  Result Value Ref Range   Total Protein 7.1 6.5 - 8.1 g/dL   Albumin 4.3 3.5 - 5.0 g/dL   AST 36 15 - 41 U/L   ALT 18 14 - 54 U/L   Alkaline Phosphatase 42 38 - 126 U/L   Total Bilirubin 4.9 (H) 0.3 - 1.2 mg/dL   Bilirubin, Direct 0.4 0.1 - 0.5 mg/dL   Indirect Bilirubin 4.5 (H) 0.3 - 0.9 mg/dL  Reticulocytes     Status: Abnormal   Collection Time: 09/09/16  6:47 AM  Result Value Ref Range   Retic Ct Pct >23.0 (H) 0.4 - 3.1 %   RBC. 2.23 (L) 3.87 - 5.11 MIL/uL   Retic Count, Manual NOT CALCULATED 19.0 - 186.0 K/uL  Protime-INR     Status: None   Collection Time: 09/09/16   6:47 AM  Result Value Ref Range   Prothrombin Time 14.8 11.4 - 15.2 seconds   INR 1.15   Urinalysis, Routine w reflex microscopic     Status: Abnormal   Collection Time: 09/09/16  6:58 AM  Result Value Ref Range   Color, Urine YELLOW YELLOW   APPearance HAZY (A) CLEAR   Specific Gravity, Urine 1.009 1.005 - 1.030   pH 7.0 5.0 - 8.0   Glucose, UA NEGATIVE NEGATIVE mg/dL   Hgb urine dipstick LARGE (A) NEGATIVE   Bilirubin Urine NEGATIVE NEGATIVE   Ketones, ur NEGATIVE NEGATIVE mg/dL   Protein, ur NEGATIVE NEGATIVE mg/dL   Nitrite NEGATIVE NEGATIVE   Leukocytes, UA SMALL (A) NEGATIVE   RBC / HPF 0-5 0 - 5 RBC/hpf   WBC, UA 6-30 0 - 5 WBC/hpf   Bacteria, UA RARE (A) NONE SEEN   Squamous Epithelial / LPF 0-5 (A) NONE SEEN  Urine Pregnancy     Status: None   Collection Time: 09/09/16  6:58 AM  Result Value Ref Range   Preg Test, Ur NEGATIVE NEGATIVE  I-stat troponin, ED     Status: None   Collection Time: 09/09/16  7:04 AM  Result Value Ref Range   Troponin i, poc 0.00 0.00 - 0.08 ng/mL   Comment 3          I-Stat Beta hCG blood, ED ([facility], [facility], [facility] only)     Status: None   Collection Time: 09/09/16  7:04 AM  Result Value Ref Range   I-stat hCG, quantitative <5.0 <5 mIU/mL   Comment 3          I-stat troponin, ED     Status: None   Collection Time: 09/09/16  11:26 AM  Result Value Ref Range   Troponin i, poc 0.00 0.00 - 0.08 ng/mL   Comment 3            Imaging results:  Dg Chest 2 View  Result Date: 09/09/2016 CLINICAL DATA:  46 year old female with central chest pain. History of sickle cell disease. EXAM: CHEST  2 VIEW COMPARISON:  Chest radiograph dated 08/15/2016 FINDINGS: The lungs are clear. There is no pleural effusion or pneumothorax. Top-normal cardiac size. No acute osseous pathology. There is absence of splenic silhouette. IMPRESSION: No active cardiopulmonary disease. Electronically Signed   By: Anner Crete M.D.   On: 09/09/2016 06:06     Assessment &  Plan:  Patient will be admitted to the day infusion center for extended observation  Start IV D5.45 for cellular rehydration at 125/hr  Oxygenation provided 2L via nasal cannula   Start Dilaudid PCA High Concentration per weight based protocol.   Patient will be re-evaluated for pain intensity in the context of function and relationship to baseline as care progresses.  If no significant pain relief, will transfer patient to inpatient services for a higher level of care.   UA is concerning for possible early stages of UTI. Will obtain urine culture.  Molli Barrows 09/09/2016, 12:26 PM

## 2016-09-09 NOTE — Progress Notes (Signed)
Pt received to the [facility] for treatment from the ED. Pt c/o pain in her back, both arms, upper chest and right leg. Pt stated her pain was 10/10. She was treated with Dilaudid PCA and IV fluids. Pt's pain only got down to 9/10. Pt was discharged and admjittedto the 3rd floor for further treatment. Pt was alert, oriented and ambulatory to wheelchair.

## 2016-09-09 NOTE — ED Notes (Signed)
EDP at the bedside.  ?

## 2016-09-09 NOTE — H&P (Signed)
[name] is an 46 y.o. female.   Chief Complaint: Pain    HPI:  [name] is a 46 y.o. female presented to the day infusion center after being transferred from [facility] Emergency department for pain related to a likely acute sickle cell pain crisis. [name] originally presented to [facility] Emergency Department at 0500 this morning reporting 10/10 pain in upper right shoulder, upper chest, and lower right legs. While in the emergency department, patient's cardiac enzymes and chest x-ray were both negative. While in the ED she received Toradol 30 mg IV , 4 mg of Hydromorphone, and IV hydration. Patient reports that she attempted to manage pain at home with oxycodone 10 mg which was unsuccessful at decreasing her pain intensity and therefore she represented to the emergency department. [name] arrived at the day infusion center around noon today received IV fluids, placed on high dose PCA with 3 mg hydromorphone hourly maximum deliveries available. She used a total of 7 mg of Hydromorphone. Pain intensity continued at a rate of 10/10. She was given an additional 1 mg of hydromorphone and pain decreased to 9/10. Patient requested admission for further pain management and evaluation.   Past Medical History:  Diagnosis Date  . Anemia 03/30/2012   Hx of sickle cell disease  . Anemia complicating pregnancy 3/82/5053  . CAP (community acquired pneumonia)    2014  . Cholelithiasis y-1  . Cholelithiasis   . Elevated LFTs   . Leg ulcer ([place]) 10/27/2012   Chronic under care of wound clinic  . Multiple open wounds of lower extremity    chronic wounds B/LLE  . Sickle cell disease ([place])     Past Surgical History:  Procedure Laterality Date  . ALLOGRAFT APPLICATION Right 9/76/7341   Procedure: SURGICAL PREP FOR GRAFTING RIGHT LOWER EXTREMITY AND APPLICATION OF Jannifer Hick;  Surgeon: Irene Limbo, MD;  Location: [place];  Service: Plastics;  Laterality: Right;  .  APPLICATION OF A-CELL OF EXTREMITY Right 10/09/2015   Procedure: APPLICATION OF Jannifer Hick;  Surgeon: Irene Limbo, MD;  Location: [place];  Service: Plastics;  Laterality: Right;  . BREAST SURGERY     left breast cyst aspiration  . CESAREAN SECTION    . CESAREAN SECTION N/A 01/06/2014   Procedure: CESAREAN SECTION;  Surgeon: Emily Filbert, MD;  Location: [facility];  Service: Obstetrics;  Laterality: N/A;  . I&D EXTREMITY Right 10/09/2015   Procedure: SURGICAL PREPARATION FOR GRAFTING RIGHT ANKLE AND APPLICATION THERASKIN;  Surgeon: Irene Limbo, MD;  Location: [place];  Service: Plastics;  Laterality: Right;  . SKIN FULL THICKNESS GRAFT Bilateral 06/17/2016   Procedure: SURGICAL PREP FOR GRAFTING, BILATERAL LOWER EXTREMITIES AND APPLICATION OF Jannifer Hick;  Surgeon: Irene Limbo, MD;  Location: [place];  Service: Plastics;  Laterality: Bilateral;  . TONSILLECTOMY      Family History  Problem Relation Age of Onset  . Sickle cell anemia Sister   . Diabetes Mother   . Asthma Mother   . Sickle cell trait Mother   . Sickle cell trait Father   . Hypertension Father    Social History:  reports that she has never smoked. She has never used smokeless tobacco. She reports that she does not drink alcohol or use drugs.  Allergies: No Known Allergies  Medications Prior to Admission  Medication Sig Dispense Refill  . Cholecalciferol (VITAMIN D) 2000 units CAPS Take 2 capsules (4,000 Units total) by mouth daily. 30 capsule 11  . collagenase (  SANTYL) ointment Apply 1 application topically daily. Pt applies to right foot.    . folic acid (FOLVITE) 1 MG tablet Take 1 tablet (1 mg total) by mouth daily. 30 tablet 11  . ibuprofen (ADVIL,MOTRIN) 600 MG tablet Take 1 tablet (600 mg total) by mouth every 8 (eight) hours as needed. (Patient taking differently: Take 600 mg by mouth every 8 (eight) hours as needed for mild pain or moderate pain. ) 60 tablet 1  . L-glutamine  (ENDARI) 5 g PACK Powder Packet Take 5 g by mouth 2 (two) times daily. 60 packet 5  . Oxycodone HCl 10 MG TABS Take 1 tablet (10 mg total) by mouth every 4 (four) hours as needed (pain). 90 tablet 0    Results for orders placed or performed during the hospital encounter of 09/09/16 (from the past 48 hour(s))  Basic metabolic panel     Status: Abnormal   Collection Time: 09/09/16  6:47 AM  Result Value Ref Range   Sodium 136 135 - 145 mmol/L   Potassium 2.8 (L) 3.5 - 5.1 mmol/L   Chloride 104 101 - 111 mmol/L   CO2 24 22 - 32 mmol/L   Glucose, Bld 88 65 - 99 mg/dL   BUN 7 6 - 20 mg/dL   Creatinine, Ser 0.34 (L) 0.44 - 1.00 mg/dL   Calcium 8.8 (L) 8.9 - 10.3 mg/dL   GFR calc non Af Amer >60 >60 mL/min   GFR calc Af Amer >60 >60 mL/min    Comment: (NOTE) The eGFR has been calculated using the CKD EPI equation. This calculation has not been validated in all clinical situations. eGFR's persistently <60 mL/min signify possible Chronic Kidney Disease.    Anion gap 8 5 - 15  CBC     Status: Abnormal   Collection Time: 09/09/16  6:47 AM  Result Value Ref Range   WBC 21.6 (H) 4.0 - 10.5 K/uL    Comment: WHITE COUNT CONFIRMED ON SMEAR   RBC 2.26 (L) 3.87 - 5.11 MIL/uL   Hemoglobin 7.1 (L) 12.0 - 15.0 g/dL   HCT 19.8 (L) 36.0 - 46.0 %   MCV 87.6 78.0 - 100.0 fL   MCH 31.4 26.0 - 34.0 pg   MCHC 35.9 30.0 - 36.0 g/dL   RDW 23.0 (H) 11.5 - 15.5 %   Platelets 421 (H) 150 - 400 K/uL    Comment: REPEATED TO VERIFY SPECIMEN CHECKED FOR CLOTS PLATELET COUNT CONFIRMED BY SMEAR   Hepatic function panel     Status: Abnormal   Collection Time: 09/09/16  6:47 AM  Result Value Ref Range   Total Protein 7.1 6.5 - 8.1 g/dL   Albumin 4.3 3.5 - 5.0 g/dL   AST 36 15 - 41 U/L   ALT 18 14 - 54 U/L   Alkaline Phosphatase 42 38 - 126 U/L   Total Bilirubin 4.9 (H) 0.3 - 1.2 mg/dL   Bilirubin, Direct 0.4 0.1 - 0.5 mg/dL   Indirect Bilirubin 4.5 (H) 0.3 - 0.9 mg/dL  Reticulocytes     Status: Abnormal    Collection Time: 09/09/16  6:47 AM  Result Value Ref Range   Retic Ct Pct >23.0 (H) 0.4 - 3.1 %    Comment: RESULTS CONFIRMED BY MANUAL DILUTION   RBC. 2.23 (L) 3.87 - 5.11 MIL/uL   Retic Count, Manual NOT CALCULATED 19.0 - 186.0 K/uL  Protime-INR     Status: None   Collection Time: 09/09/16  6:47 AM  Result Value  Ref Range   Prothrombin Time 14.8 11.4 - 15.2 seconds   INR 1.15   Urinalysis, Routine w reflex microscopic     Status: Abnormal   Collection Time: 09/09/16  6:58 AM  Result Value Ref Range   Color, Urine YELLOW YELLOW   APPearance HAZY (A) CLEAR   Specific Gravity, Urine 1.009 1.005 - 1.030   pH 7.0 5.0 - 8.0   Glucose, UA NEGATIVE NEGATIVE mg/dL   Hgb urine dipstick LARGE (A) NEGATIVE   Bilirubin Urine NEGATIVE NEGATIVE   Ketones, ur NEGATIVE NEGATIVE mg/dL   Protein, ur NEGATIVE NEGATIVE mg/dL   Nitrite NEGATIVE NEGATIVE   Leukocytes, UA SMALL (A) NEGATIVE   RBC / HPF 0-5 0 - 5 RBC/hpf   WBC, UA 6-30 0 - 5 WBC/hpf   Bacteria, UA RARE (A) NONE SEEN   Squamous Epithelial / LPF 0-5 (A) NONE SEEN  Urine Pregnancy     Status: None   Collection Time: 09/09/16  6:58 AM  Result Value Ref Range   Preg Test, Ur NEGATIVE NEGATIVE    Comment:        THE SENSITIVITY OF THIS METHODOLOGY IS >20 mIU/mL.   I-stat troponin, ED     Status: None   Collection Time: 09/09/16  7:04 AM  Result Value Ref Range   Troponin i, poc 0.00 0.00 - 0.08 ng/mL   Comment 3            Comment: Due to the release kinetics of cTnI, a negative result within the first hours of the onset of symptoms does not rule out myocardial infarction with certainty. If myocardial infarction is still suspected, repeat the test at appropriate intervals.   I-Stat Beta hCG blood, ED ([facility], [facility], [facility] only)     Status: None   Collection Time: 09/09/16  7:04 AM  Result Value Ref Range   I-stat hCG, quantitative <5.0 <5 mIU/mL   Comment 3            Comment:   GEST. AGE      CONC.  (mIU/mL)   <=1 WEEK        5 -  50     2 WEEKS       50 - 500     3 WEEKS       100 - 10,000     4 WEEKS     1,000 - 30,000        FEMALE AND NON-PREGNANT FEMALE:     LESS THAN 5 mIU/mL   I-stat troponin, ED     Status: None   Collection Time: 09/09/16 11:26 AM  Result Value Ref Range   Troponin i, poc 0.00 0.00 - 0.08 ng/mL   Comment 3            Comment: Due to the release kinetics of cTnI, a negative result within the first hours of the onset of symptoms does not rule out myocardial infarction with certainty. If myocardial infarction is still suspected, repeat the test at appropriate intervals.    Dg Chest 2 View  Result Date: 09/09/2016 CLINICAL DATA:  46 year old female with central chest pain. History of sickle cell disease. EXAM: CHEST  2 VIEW COMPARISON:  Chest radiograph dated 08/15/2016 FINDINGS: The lungs are clear. There is no pleural effusion or pneumothorax. Top-normal cardiac size. No acute osseous pathology. There is absence of splenic silhouette. IMPRESSION: No active cardiopulmonary disease. Electronically Signed   By: Anner Crete M.D.   On: 09/09/2016  06:06   Review of Systems  Constitutional: Positive for malaise/fatigue.  HENT: Negative.   Eyes: Negative.  Negative for blurred vision.  Cardiovascular:       Reports upper chest wall tenderness that is aching   Gastrointestinal: Negative.   Genitourinary: Negative.   Musculoskeletal: Positive for joint pain and myalgias.  Neurological: Negative for dizziness and headaches.  Psychiatric/Behavioral: Negative.     Blood pressure (!) 145/74, pulse 64, temperature 98.4 F (36.9 C), temperature source Oral, resp. rate 12, height 5' 7" (1.702 m), weight 124 lb (56.2 kg), last menstrual period 08/12/2016, SpO2 100 %. Physical Exam  General Appearance:    Alert, cooperative, no distress, appears stated age  Head:    Normocephalic, without obvious abnormality, atraumatic  Eyes:    PERRL, conjunctiva/corneas clear, EOM's intact  Back:      Symmetric, no curvature, ROM normal, no CVA tenderness  Lungs:     Clear to auscultation bilaterally, respirations unlabored  Chest Wall:    Subjective upper chest wall tenderness-negative chest ray today   Heart:    Regular rate and rhythm, S1 and S2 normal, no murmur, rub   or gallop  Abdomen:     Soft, non-tender, bowel sounds active all four quadrants,    no masses, no organomegaly  Extremities:   Extremities normal, atraumatic, no cyanosis or edema  Pulses:   2+ and symmetric all extremities  Skin:   Skin color, texture, turgor normal, no rashes or lesions  Neurologic:   Normal strength   Assessment/Plan [name] appears in distress related to degree of her pain. She has received a total of 7 mg of hydromorphone via PCA and an additional 1 mg hydromorphone IV push with minimal improvement of pain ( current pain 9/10 from 10/10). Patient is being admitted to impatient services for further evaluation and management of pain likely associated with an acute sickle cell pain crisis. Patient is hemodynamically stable. UA mildly abnormal-urine culture pending, patient is asymptomatic of UTI symptoms. Leukocytosis present -chest x-ray 09/09/2016 is negative. Wound Care Consulted-patient has bilateral chronic leg ulcers.   Molli Barrows, FNP 09/09/2016, 4:29 PM

## 2016-09-09 NOTE — Discharge Instructions (Addendum)
Continue to take your medications as previously prescribed. Call to schedule a follow up appointment with your primary care doctor within 2 days without fail. You potassium was low today--please have this rechecked this week. Eat foods such as bananas and fruits/vegetables to help maintain your potassium levels.Return to the ER if you experience fevers, chills, unexplained weight loss, dizziness, vision or gait changes, change in/persistent/worsening chest pain, shortness of breath, abdominal pain, nausea/vomiting/diarrhea, extremity numbness/tingling/weakness, worsening symptoms, or any additional concerns.

## 2016-09-09 NOTE — Progress Notes (Signed)
Patient ID: [name], female   DOB: November 17, 1970, 46 y.o.   MRN: [id number] Received a call from [name] in ED to assess patient. Called back and [name] states that she already spoke with [name] and did not need to speak with me.   [name],[name]

## 2016-09-09 NOTE — ED Notes (Signed)
Patient ambulatory to bathroom without difficulty.

## 2016-09-09 NOTE — ED Provider Notes (Signed)
[facility] DEPT Provider Note   CSN: [id number] Arrival date & time: 09/09/16  8921     History   Chief Complaint Chief Complaint  Patient presents with  . Chest Pain  . Sickle Cell Pain Crisis    HPI [name] is a 46 y.o. female.  Pt is a 46 y/o F with PMH of sickle cell anemia who presents to ED for pain to bilateral arms, legs, and anterior chest, onset at 0100 today, reports pain feels similar to previous sickle cell crises pain, describes as sharp, tried oxycodone without significant relief. States chest pain "all over," worse with touch, no radiation, describes as sharp, non exertional; no SOB, cough, or pleurisy. Denies fevers, chills, unexplained weight loss, dizziness, vision or gait changes, Sob, cough, hemoptysis, pleurisy, abd pain, n/v/d, dysuria, hematuria, extremity swelling/numbness/tingling, LE edema or calf tenderness, or any additional concerns. No hx of DVT/PE. No anticoag use.      Past Medical History:  Diagnosis Date  . Anemia 03/30/2012   Hx of sickle cell disease  . Anemia complicating pregnancy 1/94/1740  . CAP (community acquired pneumonia)    2014  . Cholelithiasis y-1  . Cholelithiasis   . Elevated LFTs   . Leg ulcer ([place]) 10/27/2012   Chronic under care of wound clinic  . Multiple open wounds of lower extremity    chronic wounds B/LLE  . Sickle cell disease [facility])     Patient Active Problem List   Diagnosis Date Noted  . 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 Hb-SS disease 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 ([place]) 03/24/2014  . Previous cesarean delivery, antepartum condition or complication 81/44/8185  . Hemochromatosis 11/10/2012  . Chronic pain syndrome 10/13/2012  . Sinus bradycardia by electrocardiogram 04/03/2012  . Chronic wound of extremity 04/01/2012  . Cholelithiasis 03/30/2012  . Leukocytosis 03/30/2012    Past Surgical History:  Procedure Laterality Date  . ALLOGRAFT APPLICATION Right 6/31/4970   Procedure: SURGICAL PREP  FOR GRAFTING RIGHT LOWER EXTREMITY AND APPLICATION OF Jannifer Hick;  Surgeon: Irene Limbo, MD;  Location: [place];  Service: Plastics;  Laterality: Right;  . APPLICATION OF A-CELL OF EXTREMITY Right 10/09/2015   Procedure: APPLICATION OF Jannifer Hick;  Surgeon: Irene Limbo, MD;  Location: [place];  Service: Plastics;  Laterality: Right;  . BREAST SURGERY     left breast cyst aspiration  . CESAREAN SECTION    . CESAREAN SECTION N/A 01/06/2014   Procedure: CESAREAN SECTION;  Surgeon: Emily Filbert, MD;  Location: [facility];  Service: Obstetrics;  Laterality: N/A;  . I&D EXTREMITY Right 10/09/2015   Procedure: SURGICAL PREPARATION FOR GRAFTING RIGHT ANKLE AND APPLICATION THERASKIN;  Surgeon: Irene Limbo, MD;  Location: [place];  Service: Plastics;  Laterality: Right;  . SKIN FULL THICKNESS GRAFT Bilateral 06/17/2016   Procedure: SURGICAL PREP FOR GRAFTING, BILATERAL LOWER EXTREMITIES AND APPLICATION OF Jannifer Hick;  Surgeon: Irene Limbo, MD;  Location: [place];  Service: Plastics;  Laterality: Bilateral;  . TONSILLECTOMY      OB History    Gravida Para Term Preterm AB Living   7 4 1 3 2 2    SAB TAB Ectopic Multiple Live Births   2       3       Home Medications    Prior to Admission medications   Medication Sig Start Date End Date Taking? Authorizing Provider  Cholecalciferol (VITAMIN D) 2000 units CAPS Take 2 capsules (4,000 Units total) by mouth daily. 06/03/16  Yes  [name], FNP  collagenase (SANTYL) ointment Apply 1 application topically daily. Pt applies to right foot.   Yes Historical Provider, MD  folic acid (FOLVITE) 1 MG tablet Take 1 tablet (1 mg total) by mouth daily. 03/21/16  Yes [name], NP  ibuprofen (ADVIL,MOTRIN) 600 MG tablet Take 1 tablet (600 mg total) by mouth every 8 (eight) hours as needed. Patient taking differently: Take 600 mg by mouth every 8 (eight) hours as needed for mild pain or moderate  pain.  08/29/16  Yes [name], FNP  L-glutamine (ENDARI) 5 g PACK Powder Packet Take 5 g by mouth 2 (two) times daily. 08/29/16  Yes [name], FNP  Oxycodone HCl 10 MG TABS Take 1 tablet (10 mg total) by mouth every 4 (four) hours as needed (pain). 08/29/16  Yes [name], FNP    Family History Family History  Problem Relation Age of Onset  . Sickle cell anemia Sister   . Diabetes Mother   . Asthma Mother   . Sickle cell trait Mother   . Sickle cell trait Father   . Hypertension Father     Social History Social History  Substance Use Topics  . Smoking status: Never Smoker  . Smokeless tobacco: Never Used  . Alcohol use No     Allergies   Patient has no known allergies.   Review of Systems Review of Systems  Constitutional: Negative for chills, fever and unexpected weight change.  Eyes: Negative for visual disturbance.  Respiratory: Negative for cough and shortness of breath.   Cardiovascular: Positive for chest pain. Negative for palpitations and leg swelling.  Gastrointestinal: Negative for abdominal pain, diarrhea, nausea and vomiting.  Genitourinary: Negative for dysuria, frequency and hematuria.  Musculoskeletal: Positive for arthralgias. Negative for neck pain and neck stiffness.  Skin: Negative for rash.  Neurological: Negative for dizziness, weakness, numbness and headaches.     Physical Exam Updated Vital Signs BP 117/71   Pulse 61   Temp 98.3 F (36.8 C) (Oral)   Resp 15   Wt 56.3 kg   LMP 08/13/2016 Comment: patient double shielded  SpO2 98%   BMI 19.45 kg/m   Physical Exam  Constitutional: She is oriented to person, place, and time. She appears well-developed and well-nourished.  HENT:  Head: Normocephalic and atraumatic.  Right Ear: Tympanic membrane and ear canal normal.  Left Ear: Tympanic membrane and ear canal normal.  Nose: Nose normal.  Mouth/Throat: Uvula is midline, oropharynx is clear and moist and mucous  membranes are normal.  Eyes: Conjunctivae and EOM are normal. Pupils are equal, round, and reactive to light.  Neck: Normal range of motion and full passive range of motion without pain. Neck supple.  Cardiovascular: Normal rate, regular rhythm, normal heart sounds and intact distal pulses.  Exam reveals no gallop and no friction rub.   No murmur heard. Pulses:      Radial pulses are 2+ on the right side, and 2+ on the left side.       Dorsalis pedis pulses are 2+ on the right side, and 2+ on the left side.  Pulmonary/Chest: Effort normal and breath sounds normal. She has no wheezes. She has no rales.  +anterior chest wall ttp, no crepitus, skin intact without erythema, ecchymosis, or rash.   Abdominal: Soft. Bowel sounds are normal. There is no tenderness. There is no rebound and no guarding.  Musculoskeletal: Normal range of motion.  Actively moves all 4 extremities.  Neurological: She is alert and oriented to person, place, and time.  Strength 5/5 to bilateral UE and LE. Speech clear, no facial droop. Gait steady while ambulating around pod.   Skin: Skin is warm and dry.  Psychiatric: She has a normal mood and affect. Her speech is normal.  Nursing note and vitals reviewed.    ED Treatments / Results  Labs (all labs ordered are listed, but only abnormal results are displayed) Labs Reviewed  BASIC METABOLIC PANEL - Abnormal; Notable for the following:       Result Value   Potassium 2.8 (*)    Creatinine, Ser 0.34 (*)    Calcium 8.8 (*)    All other components within normal limits  CBC - Abnormal; Notable for the following:    WBC 21.6 (*)    RBC 2.26 (*)    Hemoglobin 7.1 (*)    HCT 19.8 (*)    RDW 23.0 (*)    Platelets 421 (*)    All other components within normal limits  HEPATIC FUNCTION PANEL - Abnormal; Notable for the following:    Total Bilirubin 4.9 (*)    Indirect Bilirubin 4.5 (*)    All other components within normal limits  RETICULOCYTES - Abnormal; Notable  for the following:    Retic Ct Pct >23.0 (*)    RBC. 2.23 (*)    All other components within normal limits  URINALYSIS, ROUTINE W REFLEX MICROSCOPIC - Abnormal; Notable for the following:    APPearance HAZY (*)    Hgb urine dipstick LARGE (*)    Leukocytes, UA SMALL (*)    Bacteria, UA RARE (*)    Squamous Epithelial / LPF 0-5 (*)    All other components within normal limits  PROTIME-INR  PREGNANCY, URINE  I-STAT TROPOININ, ED  I-STAT BETA HCG BLOOD, ED ([place], [facility], [facility] ONLY)  I-STAT TROPOININ, ED    EKG  EKG Interpretation  Date/Time:  Monday September 09 2016 05:47:58 EDT Ventricular Rate:  64 PR Interval:    QRS Duration: 100 QT Interval:  472 QTC Calculation: 487 R Axis:   66 Text Interpretation:  Sinus rhythm Borderline repolarization abnormality Borderline prolonged QT interval Similar to EKG in 2013 Confirmed by WARD,  DO, KRISTEN ([id number]) on 09/09/2016 5:52:54 AM       Radiology No results found.  Procedures Procedures (including critical care time)  Medications Ordered in ED Medications  HYDROmorphone (DILAUDID) injection 1 mg (1 mg Intravenous Given 09/09/16 0648)  HYDROmorphone (DILAUDID) injection 1 mg (1 mg Intravenous Given 09/09/16 0814)  potassium chloride (K-DUR,KLOR-CON) CR tablet 30 mEq (30 mEq Oral Given 09/09/16 0814)  HYDROmorphone (DILAUDID) injection 0.5-1 mg (1 mg Intravenous Given 09/09/16 0922)  ketorolac (TORADOL) 30 MG/ML injection 30 mg (30 mg Intravenous Given 09/09/16 0854)  HYDROmorphone (DILAUDID) injection 0.5-1 mg (1 mg Intravenous Given 09/09/16 0854)  potassium chloride (K-DUR,KLOR-CON) CR tablet 30 mEq (30 mEq Oral Given 09/09/16 1150)     Initial Impression / Assessment and Plan / ED Course  I have reviewed the triage vital signs and the nursing notes.  Pertinent labs & imaging results that were available during my care of the patient were reviewed by me and considered in my medical decision making (see chart for details).    Pt is a 46  y/o F who presents to ED for bilateral arm and leg pain, symptoms consistent with prior acute pain crisis related to Sickle Cell Disease. Also reports reproducible chest pain, has experienced similar pain  before with sickle cell crises, will get EKG, trop, and CXR to r.o acute chest syndrome. Will check labs include retic count, hydrate, give analgesia and re-eval. Doubt sepsis or acute chest syndrome at this time. Afebrile, hemodynamically stable, no respiratory distress. Records reviewed, admitted on 08/10/16 for sickle cell crisis. O2 desaturations with 2mg  dilaudid in last visit, will begin with 1mg  IV.   7:56 AM K 2.8, K ordered. Cr 0.34, baseline per 08/29/16. Anemia near baseline with reticulocytosis, +leukocytosis but no s/s infection, likely s/t stress rxn from crises. Pt reports continued pain, will order toradol and additional dose of dilaudid. Plan to re-eval.   8:47 AM On re-eval, continued pain, will order additional analgesia and re-eval.  9:16 AM On re-eval, pt reports continued pain. UA with large blood, -infection; double nephrolithiasis at this time.   10:29 AM [facility] paged  10:55 AM Spoke to [name], provider at [facility], accepted patient for continued pain management. Pt denies any additional concerns at this time. Will plan to transfer up to sickle cell clinic  11:42 AM  Second troponin negative. Pt given another 92meq oral potassium for replacement prior to dc to sickle cell clinic. Discussed results, discharge instructions, return precautions, and follow up. Pt verbalizes understanding using verbal teachback and agrees with plan, denies any additional concerns.   The patient was discussed with Dr. Wilson Singer who agrees with the treatment plan and disposition.   Final Clinical Impressions(s) / ED Diagnoses   Final diagnoses:  Sickle cell crisis ([place])  Chest pain, unspecified type    New Prescriptions Discharge Medication List as of 09/09/2016  11:47 AM       Ulice Bold, NP 09/11/16 2317    Virgel Manifold, MD 09/12/16 [phone]

## 2016-09-10 DIAGNOSIS — D72829 Elevated white blood cell count, unspecified: Secondary | ICD-10-CM

## 2016-09-10 DIAGNOSIS — D638 Anemia in other chronic diseases classified elsewhere: Secondary | ICD-10-CM

## 2016-09-10 DIAGNOSIS — D5700 Hb-SS disease with crisis, unspecified: Principal | ICD-10-CM

## 2016-09-10 DIAGNOSIS — L97909 Non-pressure chronic ulcer of unspecified part of unspecified lower leg with unspecified severity: Secondary | ICD-10-CM

## 2016-09-10 LAB — COMPREHENSIVE METABOLIC PANEL
ALT: 16 U/L (ref 14–54)
AST: 27 U/L (ref 15–41)
Albumin: 3.5 g/dL (ref 3.5–5.0)
Alkaline Phosphatase: 42 U/L (ref 38–126)
Anion gap: 2 — ABNORMAL LOW (ref 5–15)
BUN: 6 mg/dL (ref 6–20)
CHLORIDE: 107 mmol/L (ref 101–111)
CO2: 29 mmol/L (ref 22–32)
Calcium: 8.2 mg/dL — ABNORMAL LOW (ref 8.9–10.3)
Creatinine, Ser: <0.3 mg/dL — ABNORMAL LOW (ref 0.44–1.00)
Glucose, Bld: 111 mg/dL — ABNORMAL HIGH (ref 65–99)
POTASSIUM: 3.7 mmol/L (ref 3.5–5.1)
SODIUM: 138 mmol/L (ref 135–145)
Total Bilirubin: 2.6 mg/dL — ABNORMAL HIGH (ref 0.3–1.2)
Total Protein: 6.4 g/dL — ABNORMAL LOW (ref 6.5–8.1)

## 2016-09-10 LAB — CBC WITH DIFFERENTIAL/PLATELET
BASOS ABS: 0 10*3/uL (ref 0.0–0.1)
BASOS PCT: 0 %
EOS ABS: 0.6 10*3/uL (ref 0.0–0.7)
Eosinophils Relative: 4 %
HCT: 17.9 % — ABNORMAL LOW (ref 36.0–46.0)
Hemoglobin: 6.4 g/dL — CL (ref 12.0–15.0)
LYMPHS PCT: 21 %
Lymphs Abs: 3.3 10*3/uL (ref 0.7–4.0)
MCH: 31.5 pg (ref 26.0–34.0)
MCHC: 35.8 g/dL (ref 30.0–36.0)
MCV: 88.2 fL (ref 78.0–100.0)
MONO ABS: 2.5 10*3/uL — AB (ref 0.1–1.0)
Monocytes Relative: 16 %
NEUTROS PCT: 59 %
Neutro Abs: 9.4 10*3/uL — ABNORMAL HIGH (ref 1.7–7.7)
Platelets: 333 10*3/uL (ref 150–400)
RBC: 2.03 MIL/uL — ABNORMAL LOW (ref 3.87–5.11)
RDW: 21.8 % — AB (ref 11.5–15.5)
WBC: 15.8 10*3/uL — ABNORMAL HIGH (ref 4.0–10.5)
nRBC: 15 /100 WBC — ABNORMAL HIGH

## 2016-09-10 LAB — RETICULOCYTES: RBC.: 2.03 MIL/uL — AB (ref 3.87–5.11)

## 2016-09-10 MED ORDER — NALOXONE HCL 0.4 MG/ML IJ SOLN: 0.4000 mg | INTRAMUSCULAR | Status: DC | PRN

## 2016-09-10 MED ORDER — DIPHENHYDRAMINE HCL 25 MG PO CAPS
25.0000 mg | ORAL_CAPSULE | Freq: Four times a day (QID) | ORAL | Status: DC | PRN
  Administered 2016-09-11 – 2016-09-12 (×3): 25 mg via ORAL
  Administered 2016-09-12: 50 mg via ORAL
  Administered 2016-09-13 (×3): 25 mg via ORAL
  Administered 2016-09-14: 50 mg via ORAL
  Administered 2016-09-14: 25 mg via ORAL
  Administered 2016-09-14: 50 mg via ORAL
  Filled 2016-09-10 (×8): qty 1
  Filled 2016-09-10: qty 2
  Filled 2016-09-10 (×2): qty 1

## 2016-09-10 MED ORDER — HYDROMORPHONE 1 MG/ML IV SOLN
INTRAVENOUS | Status: DC
  Administered 2016-09-10: 3.94 mg via INTRAVENOUS
  Filled 2016-09-10: qty 25

## 2016-09-10 MED ORDER — COLLAGENASE 250 UNIT/GM EX OINT
TOPICAL_OINTMENT | Freq: Every day | CUTANEOUS | Status: DC
  Administered 2016-09-10 – 2016-09-15 (×6): via TOPICAL
  Filled 2016-09-10: qty 30

## 2016-09-10 MED ORDER — ONDANSETRON HCL 4 MG/2ML IJ SOLN: 4.0000 mg | Freq: Four times a day (QID) | INTRAMUSCULAR | Status: DC | PRN

## 2016-09-10 MED ORDER — HYDROMORPHONE HCL 2 MG/ML IJ SOLN
1.0000 mg | Freq: Once | INTRAMUSCULAR | Status: AC
  Administered 2016-09-10: 1 mg via INTRAVENOUS
  Filled 2016-09-10: qty 1

## 2016-09-10 MED ORDER — SODIUM CHLORIDE 0.9 % IV SOLN
25.0000 mg | INTRAVENOUS | Status: DC | PRN
  Filled 2016-09-10: qty 0.5

## 2016-09-10 MED ORDER — SODIUM CHLORIDE 0.9% FLUSH: 9.0000 mL | INTRAVENOUS | Status: DC | PRN

## 2016-09-10 MED ORDER — HYDROMORPHONE HCL 4 MG/ML IJ SOLN
1.0000 mg | Freq: Once | INTRAMUSCULAR | Status: AC
  Administered 2016-09-10: 1 mg via INTRAVENOUS

## 2016-09-10 MED ORDER — ENSURE ENLIVE PO LIQD
237.0000 mL | Freq: Two times a day (BID) | ORAL | Status: DC
  Administered 2016-09-10 – 2016-09-15 (×9): 237 mL via ORAL

## 2016-09-10 MED ORDER — HYDROMORPHONE 1 MG/ML IV SOLN
INTRAVENOUS | Status: DC
  Administered 2016-09-10: 13.5 mg via INTRAVENOUS
  Administered 2016-09-10: 1 mg via INTRAVENOUS
  Administered 2016-09-11: 3 mg via INTRAVENOUS
  Administered 2016-09-11: 13:00:00 via INTRAVENOUS
  Administered 2016-09-11: 6 mg via INTRAVENOUS
  Filled 2016-09-10 (×2): qty 25

## 2016-09-10 NOTE — Consult Note (Signed)
[place] Nurse wound consult note Reason for Consult: resting more comfortably now.  Has been treating nonintact ulcers to right lateral and medial malleolus with Santyl enzymatic debrider.  Due to the presence of devitalized tissue, will continue this therapy.  Wound type:Chronic nonhealing Pressure Injury POA: N/A Measurement: Right malleolus:  Lateral 7 cm x 5 cm x 0.2 cm  Medial:  5 cm x 3 cm x 0.2 cm  Wound bed:10% devitalized tissue.  Drainage (amount, consistency, odor) Minimal serosanguinous  Musty odor.  Periwound:intact.  Scarring to left foot and malleolus from healed lesions.  Dressing procedure/placement/frequency:Cleanse wounds to right lateral and medial malleolus with Ns.  Apply Santyl to wound bed.  Cover with NS moist gauze.  Secure with 4x4 gauze and kerlix/tape.  Change daily.  Will not follow at this time.  Please re-consult if needed.  Domenic Moras RN BSN [place] Pager [phone]

## 2016-09-10 NOTE — Progress Notes (Signed)
On call returned page stating that she believes that the PCA order was discontinued by a mistake. She requested that the day nurse call attending, Dr Zigmund Daniel about this situation at 0800. Will pass on in handoff report this morning.

## 2016-09-10 NOTE — Progress Notes (Signed)
Initial Nutrition Assessment  INTERVENTION:   Provide Ensure Enlive po BID, each supplement provides 350 kcal and 20 grams of protein Encourage PO intake RD to continue to monitor for needs  NUTRITION DIAGNOSIS:   Inadequate oral intake related to poor appetite as evidenced by per patient/family report.  GOAL:   Patient will meet greater than or equal to 90% of their needs  MONITOR:   PO intake, Supplement acceptance, Labs, Weight trends, Skin, I & O's  REASON FOR ASSESSMENT:   Malnutrition Screening Tool    ASSESSMENT:   46 y.o. female presented to the day infusion center after being transferred from [facility] Emergency department for pain related to a likely acute sickle cell pain crisis.   Patient in room with no family at bedside. Pt states she has poor appetite and has to make herself eat. States she ate a pancake and eggs this morning for breakfast but was not hungry for it. She has not ordered any lunch at this time. Encouraged pt to request snacks from unit, if desired. Pt is willing to try protein supplements, will order Ensure supplements BID. Pt with nonhealing wounds on bilateral legs.   Per chart review, weight is stable. Nutrition focused physical exam shows no sign of depletion of muscle mass or body fat.  Labs reviewed. Medications: Endari (L-glutamine) powder BID, Protonix tablet daily, Senokot-S tablet BID   Diet Order:  Diet regular Room service appropriate? Yes  Skin:  Wound (see comment) (bilateral leg ulcers)  Last BM:  4/22  Height:   Ht Readings from Last 1 Encounters:  09/09/16 5\' 7"  (1.702 m)    Weight:   Wt Readings from Last 1 Encounters:  09/09/16 124 lb (56.2 kg)    Ideal Body Weight:  61.4 kg  BMI:  Body mass index is 19.42 kg/m.  Estimated Nutritional Needs:   Kcal:  1500-1700  Protein:  70-80g  Fluid:  1.7L/day  EDUCATION NEEDS:   No education needs identified at this time  Clayton Bibles, MS, RD, LDN Pager:  [phone] After Hours Pager: [phone]

## 2016-09-10 NOTE — Progress Notes (Signed)
Patient PCA is near empty. This writer attempted to place another syringe and seen where PCA Dilaudid has been discontinued. Patient at this time is not aware. This Probation officer will page on call to get clarification on pain management for this patient.

## 2016-09-10 NOTE — Progress Notes (Signed)
Critical Hgb level called to this RN this am. "6.4". On call notified for further orders.

## 2016-09-10 NOTE — Progress Notes (Signed)
SICKLE CELL SERVICE PROGRESS NOTE  [name] IHK:[id number] DOB: [birth year] DOA: 09/09/2016 PCP: [name], FNP  Assessment/Plan: Active Problems:   Leukocytosis   Hb-SS disease with crisis ([place])  1. Hb SS with crisis: I have increased the frequency Dilaudid bolus dose on the PCA to 8 minutes and also give another bolus dose of 1 mg. Continue Toradol and decrease IVF to [facility].  2. Anemia of chronic disease: Pt has a tolerated anemia and is currently asymptomatic.  3. Chronic leg wound: Pt has chronic wound on RLE: Continue Santyl with dry gauze dressing. Consult WOC.  4. Leukocytosis: Pt has a chronic Leukocytosis. Likely a reflection of her sickle cell disease.     Code Status: Full Code Family Communication: N/A Disposition Plan: Not yet ready for discharge  [place].  Pager [phone]. If 7PM-7AM, please contact night-coverage.  09/10/2016, 10:39 AM  LOS: 1 day   Interim History: Pt reports pain as 10/10 in BUE's which she states has been at the same level since yesterday with no relief even with the care received at the [facility]. She also has pain localized to the B/L knees and chest wall both of which are both intermittent and occurs at an intensity of about 7/10. She denies fever, chills. Dysuria, HA, vomiting or diairrhea.  Consultants:  None  Procedures:  None  Antibiotics:  None   Objective: Vitals:   09/09/16 1712 09/09/16 2104 09/10/16 0526 09/10/16 0844  BP: (!) 144/75 130/76 126/82   Pulse: 70 78 78   Resp: 14 15 14 14   Temp: 98.5 F (36.9 C) 98.8 F (37.1 C) 98.6 F (37 C)   TempSrc: Oral Oral Oral   SpO2: 96% 98% 99% 100%  Weight:      Height:       Weight change:   Intake/Output Summary (Last 24 hours) at 09/10/16 1039 Last data filed at 09/10/16 0939  Gross per 24 hour  Intake          1788.75 ml  Output             1400 ml  Net           388.75 ml    General: Alert, awake, oriented x3, in moderate distress.   HEENT: [place]/AT PEERL, EOMI, anicteric. Neck: Trachea midline,  no masses, no thyromegal,y no JVD, no carotid bruit OROPHARYNX:  Moist, No exudate/ erythema/lesions.  Heart: Regular rate and rhythm, without murmurs, rubs, gallops, PMI non-displaced, no heaves or thrills on palpation.  Lungs: Clear to auscultation, no wheezing or rhonchi noted. No increased vocal fremitus resonant to percussion  Abdomen: Soft, nontender, nondistended, positive bowel sounds, no masses no hepatosplenomegaly noted.  Neuro: No focal neurological deficits noted cranial nerves II through XII grossly intact.  Strength a functional baseline in bilateral upper and lower extremities. Musculoskeletal: No warmth swelling or erythema around joints, no spinal tenderness noted. Psychiatric: Patient alert and oriented x3, good insight and cognition, good recent to remote recall. Skin: Pt has wound on inner and outer surface of Right led. #1 (lateral) 7cm x 5 cm  #2 (medial) 5 cm x 3 cm. Both with minimal serosanguinous drainage.    Data Reviewed: Basic Metabolic Panel:  Recent Labs Lab 09/09/16 0647 09/10/16 0359  NA 136 138  K 2.8* 3.7  CL 104 107  CO2 24 29  GLUCOSE 88 111*  BUN 7 6  CREATININE 0.34* <0.30*  CALCIUM 8.8* 8.2*   Liver Function Tests:  Recent Labs Lab  09/09/16 0647 09/10/16 0359  AST 36 27  ALT 18 16  ALKPHOS 42 42  BILITOT 4.9* 2.6*  PROT 7.1 6.4*  ALBUMIN 4.3 3.5   No results for input(s): LIPASE, AMYLASE in the last 168 hours. No results for input(s): AMMONIA in the last 168 hours. CBC:  Recent Labs Lab 09/09/16 0647 09/10/16 0359  WBC 21.6* 15.8*  NEUTROABS  --  9.4*  HGB 7.1* 6.4*  HCT 19.8* 17.9*  MCV 87.6 88.2  PLT 421* 333   Cardiac Enzymes: No results for input(s): CKTOTAL, CKMB, CKMBINDEX, TROPONINI in the last 168 hours. BNP (last 3 results)  Recent Labs  08/15/16 2007  BNP 48.3    ProBNP (last 3 results) No results for input(s): PROBNP in the last 8760  hours.  CBG: No results for input(s): GLUCAP in the last 168 hours.  No results found for this or any previous visit (from the past 240 hour(s)).   Studies: Dg Chest 2 View  Result Date: 09/09/2016 CLINICAL DATA:  46 year old female with central chest pain. History of sickle cell disease. EXAM: CHEST  2 VIEW COMPARISON:  Chest radiograph dated 08/15/2016 FINDINGS: The lungs are clear. There is no pleural effusion or pneumothorax. Top-normal cardiac size. No acute osseous pathology. There is absence of splenic silhouette. IMPRESSION: No active cardiopulmonary disease. Electronically Signed   By: Anner Crete M.D.   On: 09/09/2016 06:06   Dg Chest 2 View  Result Date: 08/15/2016 CLINICAL DATA:  Sickle cell pain crisis, acute onset. Right arm pain, extending down the right side of the torso. Initial encounter. EXAM: CHEST  2 VIEW COMPARISON:  Chest radiograph performed 10/16/2012, and CT of the chest performed 10/18/2012 FINDINGS: The lungs are well-aerated. Mild vascular congestion is noted. There is no evidence of focal opacification, pleural effusion or pneumothorax. The heart is mildly enlarged. No acute osseous abnormalities are seen. IMPRESSION: Mild vascular congestion and mild cardiomegaly. Lungs remain grossly clear. Electronically Signed   By: Garald Balding M.D.   On: 08/15/2016 20:55    Scheduled Meds: . enoxaparin (LOVENOX) injection  40 mg Subcutaneous Q24H  . HYDROmorphone   Intravenous Q4H  .  HYDROmorphone (DILAUDID) injection  1 mg Intravenous Once  . ketorolac  30 mg Intravenous Q6H  . L-glutamine  5 g Oral BID  . pantoprazole  40 mg Oral Daily   Or  . pantoprazole (PROTONIX) IV  40 mg Intravenous Daily  . senna-docusate  1 tablet Oral BID   Continuous Infusions: . dextrose 5 % and 0.45% NaCl 125 mL/hr at 09/09/16 2103  . diphenhydrAMINE (BENADRYL) IVPB(SICKLE CELL ONLY)      Active Problems:   Leukocytosis   Hb-SS disease with crisis ([place])   In excess of 35  minutes spent during this visit. Greater than 50% involved face to face contact with the patient for assessment, counseling and coordination of care.

## 2016-09-11 ENCOUNTER — Encounter (HOSPITAL_COMMUNITY): Payer: Self-pay

## 2016-09-11 DIAGNOSIS — S81801D Unspecified open wound, right lower leg, subsequent encounter: Secondary | ICD-10-CM

## 2016-09-11 LAB — URINE CULTURE

## 2016-09-11 MED ORDER — HYDROMORPHONE 1 MG/ML IV SOLN
INTRAVENOUS | Status: DC
  Administered 2016-09-12: 7.2 mg via INTRAVENOUS
  Administered 2016-09-12: 03:00:00 via INTRAVENOUS
  Administered 2016-09-12: 10.8 mg via INTRAVENOUS
  Administered 2016-09-12: 18:00:00 via INTRAVENOUS
  Administered 2016-09-12: 11.39 mg via INTRAVENOUS
  Administered 2016-09-12: 1 mg via INTRAVENOUS
  Administered 2016-09-13: 7.78 mg via INTRAVENOUS
  Administered 2016-09-13: 5.4 mg via INTRAVENOUS
  Administered 2016-09-13: 6 mg via INTRAVENOUS
  Administered 2016-09-13: 4.2 mg via INTRAVENOUS
  Administered 2016-09-13: 3 mg via INTRAVENOUS
  Administered 2016-09-13 (×2): 7.2 mg via INTRAVENOUS
  Administered 2016-09-13: 25 mg via INTRAVENOUS
  Administered 2016-09-14: 1 mg via INTRAVENOUS
  Administered 2016-09-14: 3 mg via INTRAVENOUS
  Administered 2016-09-14: 01:00:00 via INTRAVENOUS
  Administered 2016-09-14: 6.6 mg via INTRAVENOUS
  Administered 2016-09-14: 5.99 mg via INTRAVENOUS
  Administered 2016-09-14: 3 mg via INTRAVENOUS
  Administered 2016-09-14: 13.6 mg via INTRAVENOUS
  Administered 2016-09-15: 6 mg via INTRAVENOUS
  Administered 2016-09-15: 9.6 mg via INTRAVENOUS
  Administered 2016-09-15: 7.8 mg via INTRAVENOUS
  Filled 2016-09-11 (×6): qty 25

## 2016-09-11 NOTE — Progress Notes (Signed)
SICKLE CELL SERVICE PROGRESS NOTE  [name] OIB:[id number] DOB: 1970-11-10 DOA: 09/09/2016 PCP: [name], FNP  Assessment/Plan: Active Problems:   Leukocytosis   Hb-SS disease with crisis ([place])   Hb SS with Crisis: Will increase PCA bolus dose to 0.6 mg. Continue Toradol and increase IVF to 75 ml/hr as patient drinking very little.   Anemia of Chronic Disease: Hb at baseline yesterday. Will re-check labs tomorrow.   Chronic Leg Wounds: Continue Santyl and moist guaze dressing.   Leukocytosis: U/C pending.    Code Status: Full Code Family Communication: N/A Disposition Plan: Not yet ready for discharge  [place].  Pager [phone]. If 7PM-7AM, please contact night-coverage.  09/11/2016, 4:34 PM  LOS: 2 days   Interim History: Pt reports pain still in BUE's at an intensity of 8/10. Pain is constant in LUE but intermittent in the RUE. She describes pain as throbbing. She has used 41 mg of Dilaudid with 92/83:demands/deliveries in the last 24 hours.    Consultants:  None  Procedures:  None  Antibiotics:  None    Objective: Vitals:   09/11/16 1229 09/11/16 1259 09/11/16 1330 09/11/16 1611  BP:   (!) 107/57   Pulse:   91   Resp: 16 13 20 15   Temp:   99.1 F (37.3 C)   TempSrc:   Oral   SpO2: 98% 98% 98% 98%  Weight:      Height:       Weight change: 4.254 kg (9 lb 6.1 oz)  Intake/Output Summary (Last 24 hours) at 09/11/16 1634 Last data filed at 09/11/16 1400  Gross per 24 hour  Intake          2551.17 ml  Output             2750 ml  Net          -198.83 ml    General: Alert, awake, oriented x3, in mild distress due to pain.  HEENT: [place]/AT PEERL, EOMI, mild icterus Neck: Trachea midline,  no masses, no thyromegal,y no JVD, no carotid bruit OROPHARYNX:  Moist, No exudate/ erythema/lesions.  Heart: Regular rate and rhythm, without murmurs, rubs, gallops, PMI non-displaced, no heaves or thrills on palpation.  Lungs: Clear to  auscultation but decreased air entry at the bases. No wheezing or rhonchi noted. No increased vocal fremitus resonant to percussion  Abdomen: Soft, nontender, nondistended, positive bowel sounds, no masses no hepatosplenomegaly noted..  Neuro: No focal neurological deficits noted cranial nerves II through XII grossly intact. . Strength at baseline in bilateral upper and lower extremities. Musculoskeletal: No warmth swelling or erythema around joints, no spinal tenderness noted. Psychiatric: Patient alert and oriented x3, good insight and cognition, good recent to remote recall. Skin: Leg wounds with minimal sero-sanguinous drainage.     Data Reviewed: Basic Metabolic Panel:  Recent Labs Lab 09/09/16 0647 09/10/16 0359  NA 136 138  K 2.8* 3.7  CL 104 107  CO2 24 29  GLUCOSE 88 111*  BUN 7 6  CREATININE 0.34* <0.30*  CALCIUM 8.8* 8.2*   Liver Function Tests:  Recent Labs Lab 09/09/16 0647 09/10/16 0359  AST 36 27  ALT 18 16  ALKPHOS 42 42  BILITOT 4.9* 2.6*  PROT 7.1 6.4*  ALBUMIN 4.3 3.5   No results for input(s): LIPASE, AMYLASE in the last 168 hours. No results for input(s): AMMONIA in the last 168 hours. CBC:  Recent Labs Lab 09/09/16 0647 09/10/16 0359  WBC 21.6* 15.8*  NEUTROABS  --  9.4*  HGB 7.1* 6.4*  HCT 19.8* 17.9*  MCV 87.6 88.2  PLT 421* 333   Cardiac Enzymes: No results for input(s): CKTOTAL, CKMB, CKMBINDEX, TROPONINI in the last 168 hours. BNP (last 3 results)  Recent Labs  08/15/16 2007  BNP 48.3    ProBNP (last 3 results) No results for input(s): PROBNP in the last 8760 hours.  CBG: No results for input(s): GLUCAP in the last 168 hours.  Recent Results (from the past 240 hour(s))  Urine culture     Status: Abnormal   Collection Time: 09/09/16 12:39 PM  Result Value Ref Range Status   Specimen Description URINE, RANDOM  Final   Special Requests NONE  Final   Culture (A)  Final    >=100,000 COLONIES/mL LACTOBACILLUS  SPECIES Standardized susceptibility testing for this organism is not available. Performed at [facility], [place] [address], [place], [place] [address]    Report Status 09/11/2016 FINAL  Final     Studies: Dg Chest 2 View  Result Date: 09/09/2016 CLINICAL DATA:  46 year old female with central chest pain. History of sickle cell disease. EXAM: CHEST  2 VIEW COMPARISON:  Chest radiograph dated 08/15/2016 FINDINGS: The lungs are clear. There is no pleural effusion or pneumothorax. Top-normal cardiac size. No acute osseous pathology. There is absence of splenic silhouette. IMPRESSION: No active cardiopulmonary disease. Electronically Signed   By: Anner Crete M.D.   On: 09/09/2016 06:06   Dg Chest 2 View  Result Date: 08/15/2016 CLINICAL DATA:  Sickle cell pain crisis, acute onset. Right arm pain, extending down the right side of the torso. Initial encounter. EXAM: CHEST  2 VIEW COMPARISON:  Chest radiograph performed 10/16/2012, and CT of the chest performed 10/18/2012 FINDINGS: The lungs are well-aerated. Mild vascular congestion is noted. There is no evidence of focal opacification, pleural effusion or pneumothorax. The heart is mildly enlarged. No acute osseous abnormalities are seen. IMPRESSION: Mild vascular congestion and mild cardiomegaly. Lungs remain grossly clear. Electronically Signed   By: Garald Balding M.D.   On: 08/15/2016 20:55    Scheduled Meds: . collagenase   Topical Daily  . enoxaparin (LOVENOX) injection  40 mg Subcutaneous Q24H  . feeding supplement (ENSURE ENLIVE)  237 mL Oral BID BM  . HYDROmorphone   Intravenous Q4H  . ketorolac  30 mg Intravenous Q6H  . L-glutamine  5 g Oral BID  . pantoprazole  40 mg Oral Daily   Or  . pantoprazole (PROTONIX) IV  40 mg Intravenous Daily  . senna-docusate  1 tablet Oral BID   Continuous Infusions: . dextrose 5 % and 0.45% NaCl 75 mL/hr at 09/11/16 1529  . diphenhydrAMINE (BENADRYL) IVPB(SICKLE CELL ONLY)      Active  Problems:   Leukocytosis   Hb-SS disease with crisis ([place])    In excess of 25 minutes spent during this visit. Greater than 50% involved face to face contact with the patient for assessment, counseling and coordination of care.

## 2016-09-12 ENCOUNTER — Encounter (HOSPITAL_COMMUNITY): Payer: Self-pay | Admitting: *Deleted

## 2016-09-12 LAB — LACTATE DEHYDROGENASE: LDH: 345 U/L — ABNORMAL HIGH (ref 98–192)

## 2016-09-12 LAB — BASIC METABOLIC PANEL
ANION GAP: 6 (ref 5–15)
BUN: 8 mg/dL (ref 6–20)
CALCIUM: 8.6 mg/dL — AB (ref 8.9–10.3)
CO2: 32 mmol/L (ref 22–32)
Chloride: 101 mmol/L (ref 101–111)
Creatinine, Ser: <0.3 mg/dL — ABNORMAL LOW (ref 0.44–1.00)
Glucose, Bld: 111 mg/dL — ABNORMAL HIGH (ref 65–99)
POTASSIUM: 4.1 mmol/L (ref 3.5–5.1)
Sodium: 139 mmol/L (ref 135–145)

## 2016-09-12 LAB — CBC WITH DIFFERENTIAL/PLATELET
BASOS ABS: 0 10*3/uL (ref 0.0–0.1)
Basophils Relative: 0 %
Eosinophils Absolute: 0.5 10*3/uL (ref 0.0–0.7)
Eosinophils Relative: 4 %
HCT: 17.1 % — ABNORMAL LOW (ref 36.0–46.0)
HEMOGLOBIN: 5.9 g/dL — AB (ref 12.0–15.0)
LYMPHS ABS: 1.2 10*3/uL (ref 0.7–4.0)
LYMPHS PCT: 9 %
MCH: 29.6 pg (ref 26.0–34.0)
MCHC: 34.5 g/dL (ref 30.0–36.0)
MCV: 85.9 fL (ref 78.0–100.0)
MONOS PCT: 12 %
Monocytes Absolute: 1.6 10*3/uL — ABNORMAL HIGH (ref 0.1–1.0)
NEUTROS ABS: 10.2 10*3/uL — AB (ref 1.7–7.7)
Neutrophils Relative %: 75 %
Platelets: 321 10*3/uL (ref 150–400)
RBC: 1.99 MIL/uL — ABNORMAL LOW (ref 3.87–5.11)
RDW: 19.8 % — AB (ref 11.5–15.5)
WBC: 13.5 10*3/uL — ABNORMAL HIGH (ref 4.0–10.5)

## 2016-09-12 LAB — RETICULOCYTES
RBC.: 1.99 MIL/uL — AB (ref 3.87–5.11)
RETIC COUNT ABSOLUTE: 324.4 10*3/uL — AB (ref 19.0–186.0)
Retic Ct Pct: 16.3 % — ABNORMAL HIGH (ref 0.4–3.1)

## 2016-09-12 MED ORDER — NITROFURANTOIN MONOHYD MACRO 100 MG PO CAPS
100.0000 mg | ORAL_CAPSULE | Freq: Two times a day (BID) | ORAL | Status: DC
  Administered 2016-09-12 – 2016-09-15 (×7): 100 mg via ORAL
  Filled 2016-09-12 (×8): qty 1

## 2016-09-12 MED ORDER — OXYCODONE HCL 5 MG PO TABS
10.0000 mg | ORAL_TABLET | ORAL | Status: DC
  Administered 2016-09-12 – 2016-09-15 (×19): 10 mg via ORAL
  Filled 2016-09-12 (×19): qty 2

## 2016-09-12 MED ORDER — HYDROMORPHONE HCL 1 MG/ML IJ SOLN
1.0000 mg | Freq: Once | INTRAMUSCULAR | Status: AC
  Administered 2016-09-12: 1 mg via INTRAVENOUS

## 2016-09-12 NOTE — Progress Notes (Signed)
SICKLE CELL SERVICE PROGRESS NOTE  [name] UKG:[id number] DOB: 1971/02/19 DOA: 09/09/2016 PCP: [name], FNP  Assessment/Plan: Active Problems:   Leukocytosis   Hb-SS disease with crisis ([place])   Hb SS with Crisis: Will schedule OyxCodone and continue PCA bolus dose at 0.6 mg for PRN use. Continue Toradol and decrease IVF to [facility].   Anemia of Chronic Disease: Hb decreased today likely due to hemolysis and effect of dilution. Reticulocytosis is robust so will hold on transfusion f RBC's.   Chronic Leg Wounds: Continue Santyl and moist guaze dressing.   Leukocytosis: U/C shows lactobacillus. Will treat with 5 days Nitrofurantoin.  Code Status: Full Code Family Communication: N/A Disposition Plan: Not yet ready for discharge  [place].  Pager [phone]. If 7PM-7AM, please contact night-coverage.  09/12/2016, 11:31 AM  LOS: 3 days   Interim History: Pt reports pain still in BUE's at an intensity of 7/10. Pain is constant in LUE but intermittent in the RUE. She describes pain as throbbing. She has used 38 mg of Dilaudid with 71/67:demands/deliveries in the last 24 hours.    Consultants:  None  Procedures:  None  Antibiotics:  None    Objective: Vitals:   09/12/16 0237 09/12/16 0610 09/12/16 0756 09/12/16 1022  BP: (!) 103/57 107/64  (!) 105/57  Pulse: 82 78  84  Resp: 20 18 (!) 21 (!) 21  Temp: 98.4 F (36.9 C) 98.9 F (37.2 C)  97.9 F (36.6 C)  TempSrc:  Oral  Oral  SpO2: 99% 100% 96% 96%  Weight: 57.9 kg (127 lb 10.3 oz)     Height:       Weight change: -2.6 kg (-5 lb 11.7 oz)  Intake/Output Summary (Last 24 hours) at 09/12/16 1131 Last data filed at 09/12/16 0800  Gross per 24 hour  Intake          1858.58 ml  Output             2100 ml  Net          -241.42 ml    General: Alert, awake, oriented x3, in mild distress due to pain.  HEENT: /AT PEERL, EOMI, mild icterus Neck: Trachea midline,  no masses, no thyromegal,y no JVD,  no carotid bruit OROPHARYNX:  Moist, No exudate/ erythema/lesions.  Heart: Regular rate and rhythm, without murmurs, rubs, gallops, PMI non-displaced, no heaves or thrills on palpation.  Lungs: Clear to auscultation but decreased air entry at the bases. No wheezing or rhonchi noted. No increased vocal fremitus resonant to percussion  Abdomen: Soft, nontender, nondistended, positive bowel sounds, no masses no hepatosplenomegaly noted..  Neuro: No focal neurological deficits noted cranial nerves II through XII grossly intact. . Strength at baseline in bilateral upper and lower extremities. Musculoskeletal: No warmth swelling or erythema around joints, no spinal tenderness noted. Psychiatric: Patient alert and oriented x3, good insight and cognition, good recent to remote recall. Skin: Leg wounds with minimal sero-sanguinous drainage.     Data Reviewed: Basic Metabolic Panel:  Recent Labs Lab 09/09/16 0647 09/10/16 0359 09/12/16 0453  NA 136 138 139  K 2.8* 3.7 4.1  CL 104 107 101  CO2 24 29 32  GLUCOSE 88 111* 111*  BUN 7 6 8   CREATININE 1.76* <0.30* <0.30*  CALCIUM 8.8* 8.2* 8.6*   Liver Function Tests:  Recent Labs Lab 09/09/16 0647 09/10/16 0359  AST 36 27  ALT 18 16  ALKPHOS 42 42  BILITOT 4.9* 2.6*  PROT 7.1 6.4*  ALBUMIN 4.3 3.5   No results for input(s): LIPASE, AMYLASE in the last 168 hours. No results for input(s): AMMONIA in the last 168 hours. CBC:  Recent Labs Lab 09/09/16 0647 09/10/16 0359 09/12/16 0453  WBC 21.6* 15.8* 13.5*  NEUTROABS  --  9.4* 10.2*  HGB 7.1* 6.4* 5.9*  HCT 19.8* 17.9* 17.1*  MCV 87.6 88.2 85.9  PLT 421* 333 321   Cardiac Enzymes: No results for input(s): CKTOTAL, CKMB, CKMBINDEX, TROPONINI in the last 168 hours. BNP (last 3 results)  Recent Labs  08/15/16 2007  BNP 48.3    ProBNP (last 3 results) No results for input(s): PROBNP in the last 8760 hours.  CBG: No results for input(s): GLUCAP in the last 168  hours.  Recent Results (from the past 240 hour(s))  Urine culture     Status: Abnormal   Collection Time: 09/09/16 12:39 PM  Result Value Ref Range Status   Specimen Description URINE, RANDOM  Final   Special Requests NONE  Final   Culture (A)  Final    >=100,000 COLONIES/mL LACTOBACILLUS SPECIES Standardized susceptibility testing for this organism is not available. Performed at [facility], [place] [address], [place], [place] [address]    Report Status 09/11/2016 FINAL  Final     Studies: Dg Chest 2 View  Result Date: 09/09/2016 CLINICAL DATA:  46 year old female with central chest pain. History of sickle cell disease. EXAM: CHEST  2 VIEW COMPARISON:  Chest radiograph dated 08/15/2016 FINDINGS: The lungs are clear. There is no pleural effusion or pneumothorax. Top-normal cardiac size. No acute osseous pathology. There is absence of splenic silhouette. IMPRESSION: No active cardiopulmonary disease. Electronically Signed   By: Anner Crete M.D.   On: 09/09/2016 06:06   Dg Chest 2 View  Result Date: 08/15/2016 CLINICAL DATA:  Sickle cell pain crisis, acute onset. Right arm pain, extending down the right side of the torso. Initial encounter. EXAM: CHEST  2 VIEW COMPARISON:  Chest radiograph performed 10/16/2012, and CT of the chest performed 10/18/2012 FINDINGS: The lungs are well-aerated. Mild vascular congestion is noted. There is no evidence of focal opacification, pleural effusion or pneumothorax. The heart is mildly enlarged. No acute osseous abnormalities are seen. IMPRESSION: Mild vascular congestion and mild cardiomegaly. Lungs remain grossly clear. Electronically Signed   By: Garald Balding M.D.   On: 08/15/2016 20:55    Scheduled Meds: . collagenase   Topical Daily  . enoxaparin (LOVENOX) injection  40 mg Subcutaneous Q24H  . feeding supplement (ENSURE ENLIVE)  237 mL Oral BID BM  . HYDROmorphone   Intravenous Q4H  .  HYDROmorphone (DILAUDID) injection  1 mg Intravenous  Once  . ketorolac  30 mg Intravenous Q6H  . L-glutamine  5 g Oral BID  . nitrofurantoin (macrocrystal-monohydrate)  100 mg Oral Q12H  . oxyCODONE  10 mg Oral Q4H  . pantoprazole  40 mg Oral Daily   Or  . pantoprazole (PROTONIX) IV  40 mg Intravenous Daily  . senna-docusate  1 tablet Oral BID   Continuous Infusions: . dextrose 5 % and 0.45% NaCl 75 mL/hr at 09/11/16 2141  . diphenhydrAMINE (BENADRYL) IVPB(SICKLE CELL ONLY)      Active Problems:   Leukocytosis   Hb-SS disease with crisis ([place])    In excess of 25 minutes spent during this visit. Greater than 50% involved face to face contact with the patient for assessment, counseling and coordination of care.

## 2016-09-13 DIAGNOSIS — N3000 Acute cystitis without hematuria: Secondary | ICD-10-CM

## 2016-09-13 MED ORDER — LACTULOSE 10 GM/15ML PO SOLN
30.0000 g | Freq: Once | ORAL | Status: AC
  Administered 2016-09-13: 30 g via ORAL
  Filled 2016-09-13: qty 45

## 2016-09-13 MED ORDER — HYDROMORPHONE HCL 1 MG/ML IJ SOLN
0.5000 mg | Freq: Once | INTRAMUSCULAR | Status: AC
  Administered 2016-09-13: 0.5 mg via INTRAVENOUS
  Filled 2016-09-13: qty 1

## 2016-09-13 NOTE — Progress Notes (Addendum)
Pt has blood tinged urine.She stated that her period statrted 4/22 and stopped 4/26. She is not spotting ,denies burning or painful urination. Her  Pad is free of blood

## 2016-09-13 NOTE — Progress Notes (Signed)
Patient ID: [name], female   DOB: [birth year], 46 y.o.   MRN: [id number] Pt apparently has been menstruating for the past several days and did not reveal this to me despite questions regarding any blood loss. She states that it is a cultural taboo to talk about such things. Pt towards end of menses and having mild spotting today. No change in therapeutic plan.  [name] A.

## 2016-09-13 NOTE — Progress Notes (Signed)
SICKLE CELL SERVICE PROGRESS NOTE  [name] JAS:[id number] DOB: [birth year] DOA: 09/09/2016 PCP: [name], FNP  Assessment/Plan: Active Problems:   Leukocytosis   Hb-SS disease with crisis ([place])  1. Hb SS with crisis: Continue scheduled Oxycodone and PCA for PRN use. Continue Toradol.  2. Anemia of Chronic disease: Pt has a chronic anemia and a robust reticulocytosis and RPI of 2.5. Requested patient to ambulate yesterday to evaluate her activity tolerance with current anemia, however she refused to ambulate. Pt agrees to ambulate today. She understands that this will impact medical decision making in her care. Presently her saturation is 96 % at rest on RA. At this time there is no indication for transfusion. If saturations low will obtain CXR.  3. Leg ulcers: Pt has ulcers on the medial and lateral aspect of RLE. Continue Santyl dressing with NS moist gauze cover. 4. UTI: Continue Macrodantin 500 mg BID fro total of 5 days.  Code Status: Full Code Family Communication: N/A Disposition Plan: Not yet ready for discharge  [place].  Pager [phone]. If 7PM-7AM, please contact night-coverage.  09/13/2016, 10:39 AM  LOS: 4 days   Interim History: Pt reports pain improved today and at intensity of 3/10 in BUE's L>R. However pain still increasing with movement. She has used 38.8 mg of Dilaudid with 65/63:demands/deliveries in the last 24 hours.   Consultants:  [place] Nurse  Procedures:  None  Antibiotics:  Macrodantin 4/26 >>  HPI/Subjective: Pt enquiring about receiving a transfusion. States that she always receives a transfusion when she has a sickle cell crisis. Explained to patient that transfusion is not indicated for treatment of crisis. However if medical assessment indicates need for transfusion then it will be arranged. However no indication at this time.  Objective: Vitals:   09/13/16 0000 09/13/16 0400 09/13/16 0756 09/13/16 0948  BP:      Pulse:       Resp: (!) 21 18 20 13   Temp:      TempSrc:      SpO2: 95% 95% 97% 96%  Weight:      Height:       Weight change:   Intake/Output Summary (Last 24 hours) at 09/13/16 1039 Last data filed at 09/13/16 0452  Gross per 24 hour  Intake           970.76 ml  Output                0 ml  Net           970.76 ml    General: Alert, awake, oriented x3, in no apparent distress.  HEENT: [place]/AT PEERL, EOMI. anicteric Neck: Trachea midline,  no masses, no thyromegal,y no JVD, no carotid bruit OROPHARYNX:  Moist, No exudate/ erythema/lesions.  Heart: Regular rate and rhythm, without murmurs, rubs, gallops, PMI non-displaced, no heaves or thrills on palpation.  Lungs: Clear to auscultation but decreased breath sounds at bases. No wheezing or rhonchi noted. No increased vocal fremitus resonant to percussion  Abdomen: Soft, nontender, nondistended, positive bowel sounds, no masses no hepatosplenomegaly noted.  Neuro: No focal neurological deficits noted cranial nerves II through XII grossly intact.Strength at functional bseline in bilateral upper and lower extremities. Musculoskeletal: No warmth swelling or erythema around joints, no spinal tenderness noted. Psychiatric: Patient alert and oriented x3, good insight and cognition, good recent to remote recall.  Skin: leg ulcers on right leg with serosanguinous drainage.  Data Reviewed: Basic Metabolic Panel:  Recent Labs Lab 09/09/16 [phone] 09/10/16  0359 09/12/16 0453  NA 136 138 139  K 2.8* 3.7 4.1  CL 104 107 101  CO2 24 29 32  GLUCOSE 88 111* 111*  BUN 7 6 8   CREATININE 0.34* <0.30* <0.30*  CALCIUM 8.8* 8.2* 8.6*   Liver Function Tests:  Recent Labs Lab 09/09/16 0647 09/10/16 0359  AST 36 27  ALT 18 16  ALKPHOS 42 42  BILITOT 4.9* 2.6*  PROT 7.1 6.4*  ALBUMIN 4.3 3.5   No results for input(s): LIPASE, AMYLASE in the last 168 hours. No results for input(s): AMMONIA in the last 168 hours. CBC:  Recent Labs Lab  09/09/16 0647 09/10/16 0359 09/12/16 0453  WBC 21.6* 15.8* 13.5*  NEUTROABS  --  9.4* 10.2*  HGB 7.1* 6.4* 5.9*  HCT 19.8* 17.9* 17.1*  MCV 87.6 88.2 85.9  PLT 421* 333 321   Cardiac Enzymes: No results for input(s): CKTOTAL, CKMB, CKMBINDEX, TROPONINI in the last 168 hours. BNP (last 3 results)  Recent Labs  08/15/16 2007  BNP 48.3    ProBNP (last 3 results) No results for input(s): PROBNP in the last 8760 hours.  CBG: No results for input(s): GLUCAP in the last 168 hours.  Recent Results (from the past 240 hour(s))  Urine culture     Status: Abnormal   Collection Time: 09/09/16 12:39 PM  Result Value Ref Range Status   Specimen Description URINE, RANDOM  Final   Special Requests NONE  Final   Culture (A)  Final    >=100,000 COLONIES/mL LACTOBACILLUS SPECIES Standardized susceptibility testing for this organism is not available. Performed at [facility], [place] [address], [place], [place] [address]    Report Status 09/11/2016 FINAL  Final     Studies: Dg Chest 2 View  Result Date: 09/09/2016 CLINICAL DATA:  46 year old female with central chest pain. History of sickle cell disease. EXAM: CHEST  2 VIEW COMPARISON:  Chest radiograph dated 08/15/2016 FINDINGS: The lungs are clear. There is no pleural effusion or pneumothorax. Top-normal cardiac size. No acute osseous pathology. There is absence of splenic silhouette. IMPRESSION: No active cardiopulmonary disease. Electronically Signed   By: Anner Crete M.D.   On: 09/09/2016 06:06   Dg Chest 2 View  Result Date: 08/15/2016 CLINICAL DATA:  Sickle cell pain crisis, acute onset. Right arm pain, extending down the right side of the torso. Initial encounter. EXAM: CHEST  2 VIEW COMPARISON:  Chest radiograph performed 10/16/2012, and CT of the chest performed 10/18/2012 FINDINGS: The lungs are well-aerated. Mild vascular congestion is noted. There is no evidence of focal opacification, pleural effusion or  pneumothorax. The heart is mildly enlarged. No acute osseous abnormalities are seen. IMPRESSION: Mild vascular congestion and mild cardiomegaly. Lungs remain grossly clear. Electronically Signed   By: Garald Balding M.D.   On: 08/15/2016 20:55    Scheduled Meds: . collagenase   Topical Daily  . enoxaparin (LOVENOX) injection  40 mg Subcutaneous Q24H  . feeding supplement (ENSURE ENLIVE)  237 mL Oral BID BM  . HYDROmorphone   Intravenous Q4H  . ketorolac  30 mg Intravenous Q6H  . L-glutamine  5 g Oral BID  . lactulose  30 g Oral Once  . nitrofurantoin (macrocrystal-monohydrate)  100 mg Oral Q12H  . oxyCODONE  10 mg Oral Q4H  . pantoprazole  40 mg Oral Daily   Or  . pantoprazole (PROTONIX) IV  40 mg Intravenous Daily  . senna-docusate  1 tablet Oral BID   Continuous Infusions: .  dextrose 5 % and 0.45% NaCl 10 mL/hr at 09/13/16 0016  . diphenhydrAMINE (BENADRYL) IVPB(SICKLE CELL ONLY)      Active Problems:   Leukocytosis   Hb-SS disease with crisis ([place])   In excess of 25 minutes spent during this visit. Greater than 50% involved face to face contact with the patient for assessment, counseling and coordination of care.

## 2016-09-14 ENCOUNTER — Encounter (HOSPITAL_COMMUNITY): Payer: Self-pay

## 2016-09-14 LAB — RETICULOCYTES
RBC.: 1.74 MIL/uL — AB (ref 3.87–5.11)
RETIC COUNT ABSOLUTE: 360.2 10*3/uL — AB (ref 19.0–186.0)
Retic Ct Pct: 20.7 % — ABNORMAL HIGH (ref 0.4–3.1)

## 2016-09-14 LAB — CBC WITH DIFFERENTIAL/PLATELET
BASOS ABS: 0.1 10*3/uL (ref 0.0–0.1)
BASOS PCT: 0 %
Eosinophils Absolute: 0.7 10*3/uL (ref 0.0–0.7)
Eosinophils Relative: 6 %
HEMATOCRIT: 15 % — AB (ref 36.0–46.0)
HEMOGLOBIN: 5.1 g/dL — AB (ref 12.0–15.0)
LYMPHS PCT: 22 %
Lymphs Abs: 2.6 10*3/uL (ref 0.7–4.0)
MCH: 29.3 pg (ref 26.0–34.0)
MCHC: 34 g/dL (ref 30.0–36.0)
MCV: 86.2 fL (ref 78.0–100.0)
MONOS PCT: 13 %
Monocytes Absolute: 1.6 10*3/uL — ABNORMAL HIGH (ref 0.1–1.0)
NEUTROS ABS: 7.1 10*3/uL (ref 1.7–7.7)
NEUTROS PCT: 59 %
Platelets: 338 10*3/uL (ref 150–400)
RBC: 1.74 MIL/uL — ABNORMAL LOW (ref 3.87–5.11)
RDW: 20.7 % — ABNORMAL HIGH (ref 11.5–15.5)
WBC: 12 10*3/uL — ABNORMAL HIGH (ref 4.0–10.5)

## 2016-09-14 LAB — PREPARE RBC (CROSSMATCH)

## 2016-09-14 MED ORDER — SODIUM CHLORIDE 0.9 % IV SOLN
Freq: Once | INTRAVENOUS | Status: AC
  Administered 2016-09-14: 14:00:00 via INTRAVENOUS

## 2016-09-14 NOTE — Progress Notes (Signed)
SICKLE CELL SERVICE PROGRESS NOTE  [name] DXA:[id number] DOB: 06-Apr-1971 DOA: 09/09/2016 PCP: [name], FNP  Assessment/Plan: Active Problems:   Leukocytosis   Hb-SS disease with crisis ([place])  1. Hb SS with crisis:Some improvement. Still on PCA.  Continue scheduled Oxycodone and PCA for PRN use. Continue Toradol.  2. Anemia of Chronic disease: Pt has a chronic anemia and a robust reticulocytosis. Hb has dropped again today. We will transfuse and recheck Hb in am. 3. Leg ulcers: Looking better. Pt has ulcers on the medial and lateral aspect of RLE. Continue Santyl dressing with NS moist gauze cover. 4. UTI: Continue Macrodantin 500 mg BID for total of 5 days.  Code Status: Full Code Family Communication: N/A Disposition Plan: Not yet ready for discharge  [facility]  Pager [phone]. If 7PM-7AM, please contact night-coverage.  09/14/2016, 1:42 PM  LOS: 5 days   Interim History: Pt reports less pain today. She has used 34 mg of Dilaudid with 58/53:demands/deliveries in the last 24 hours.   Consultants:  [place] Nurse  Procedures:  None  Antibiotics:  Macrodantin 4/26 >>  HPI/Subjective: Pt enquiring about receiving a transfusion. States that she always receives a transfusion when she has a sickle cell crisis. Explained to patient that transfusion is not indicated for treatment of crisis. However if medical assessment indicates need for transfusion then it will be arranged. However no indication at this time.  Objective: Vitals:   09/14/16 0810 09/14/16 1000 09/14/16 1211 09/14/16 1315  BP:  109/72  102/64  Pulse:  74  73  Resp: 18 16 15 16   Temp:  98.7 F (37.1 C)  98 F (36.7 C)  TempSrc:  Oral  Oral  SpO2: 100% 99% 99% 100%  Weight:      Height:       Weight change:   Intake/Output Summary (Last 24 hours) at 09/14/16 1342 Last data filed at 09/13/16 2153  Gross per 24 hour  Intake              480 ml  Output              400 ml  Net                80 ml    General: Alert, awake, oriented x3, in no apparent distress.  HEENT: [place]/AT PEERL, EOMI. anicteric Neck: Trachea midline,  no masses, no thyromegal,y no JVD, no carotid bruit OROPHARYNX:  Moist, No exudate/ erythema/lesions.  Heart: Regular rate and rhythm, without murmurs, rubs, gallops, PMI non-displaced, no heaves or thrills on palpation.  Lungs: Clear to auscultation but decreased breath sounds at bases. No wheezing or rhonchi noted. No increased vocal fremitus resonant to percussion  Abdomen: Soft, nontender, nondistended, positive bowel sounds, no masses no hepatosplenomegaly noted.  Neuro: No focal neurological deficits noted cranial nerves II through XII grossly intact.Strength at functional bseline in bilateral upper and lower extremities. Musculoskeletal: No warmth swelling or erythema around joints, no spinal tenderness noted. Psychiatric: Patient alert and oriented x3, good insight and cognition, good recent to remote recall.  Skin: leg ulcers on right leg with serosanguinous drainage.  Data Reviewed: Basic Metabolic Panel:  Recent Labs Lab 09/09/16 0647 09/10/16 0359 09/12/16 0453  NA 136 138 139  K 2.8* 3.7 4.1  CL 104 107 101  CO2 24 29 32  GLUCOSE 88 111* 111*  BUN 7 6 8   CREATININE 0.34* <0.30* <0.30*  CALCIUM 8.8* 8.2* 8.6*   Liver Function Tests:  Recent  Labs Lab 09/09/16 0647 09/10/16 0359  AST 36 27  ALT 18 16  ALKPHOS 42 42  BILITOT 4.9* 2.6*  PROT 7.1 6.4*  ALBUMIN 4.3 3.5   No results for input(s): LIPASE, AMYLASE in the last 168 hours. No results for input(s): AMMONIA in the last 168 hours. CBC:  Recent Labs Lab 09/09/16 0647 09/10/16 0359 09/12/16 0453 09/14/16 0445  WBC 21.6* 15.8* 13.5* 12.0*  NEUTROABS  --  9.4* 10.2* 7.1  HGB 7.1* 6.4* 5.9* 5.1*  HCT 19.8* 17.9* 17.1* 15.0*  MCV 87.6 88.2 85.9 86.2  PLT 421* 333 321 338   Cardiac Enzymes: No results for input(s): CKTOTAL, CKMB, CKMBINDEX, TROPONINI in the last 168  hours. BNP (last 3 results)  Recent Labs  08/15/16 2007  BNP 48.3    ProBNP (last 3 results) No results for input(s): PROBNP in the last 8760 hours.  CBG: No results for input(s): GLUCAP in the last 168 hours.  Recent Results (from the past 240 hour(s))  Urine culture     Status: Abnormal   Collection Time: 09/09/16 12:39 PM  Result Value Ref Range Status   Specimen Description URINE, RANDOM  Final   Special Requests NONE  Final   Culture (A)  Final    >=100,000 COLONIES/mL LACTOBACILLUS SPECIES Standardized susceptibility testing for this organism is not available. Performed at [facility], [place] [address] [place], [place] [address]    Report Status 09/11/2016 FINAL  Final     Studies: Dg Chest 2 View  Result Date: 09/09/2016 CLINICAL DATA:  46 year old female with central chest pain. History of sickle cell disease. EXAM: CHEST  2 VIEW COMPARISON:  Chest radiograph dated 08/15/2016 FINDINGS: The lungs are clear. There is no pleural effusion or pneumothorax. Top-normal cardiac size. No acute osseous pathology. There is absence of splenic silhouette. IMPRESSION: No active cardiopulmonary disease. Electronically Signed   By: Anner Crete M.D.   On: 09/09/2016 06:06   Dg Chest 2 View  Result Date: 08/15/2016 CLINICAL DATA:  Sickle cell pain crisis, acute onset. Right arm pain, extending down the right side of the torso. Initial encounter. EXAM: CHEST  2 VIEW COMPARISON:  Chest radiograph performed 10/16/2012, and CT of the chest performed 10/18/2012 FINDINGS: The lungs are well-aerated. Mild vascular congestion is noted. There is no evidence of focal opacification, pleural effusion or pneumothorax. The heart is mildly enlarged. No acute osseous abnormalities are seen. IMPRESSION: Mild vascular congestion and mild cardiomegaly. Lungs remain grossly clear. Electronically Signed   By: Garald Balding M.D.   On: 08/15/2016 20:55    Scheduled Meds: . collagenase   Topical  Daily  . enoxaparin (LOVENOX) injection  40 mg Subcutaneous Q24H  . feeding supplement (ENSURE ENLIVE)  237 mL Oral BID BM  . HYDROmorphone   Intravenous Q4H  . L-glutamine  5 g Oral BID  . nitrofurantoin (macrocrystal-monohydrate)  100 mg Oral Q12H  . oxyCODONE  10 mg Oral Q4H  . pantoprazole  40 mg Oral Daily  . senna-docusate  1 tablet Oral BID   Continuous Infusions: . sodium chloride    . dextrose 5 % and 0.45% NaCl 10 mL/hr at 09/13/16 0016    Active Problems:   Leukocytosis   Hb-SS disease with crisis ([place])   In excess of 25 minutes spent during this visit. Greater than 50% involved face to face contact with the patient for assessment, counseling and coordination of care.

## 2016-09-15 NOTE — Discharge Summary (Signed)
Physician Discharge Summary  Patient ID: [name] MRN: [id number] DOB/AGE: 1970-09-26 46 y.o.  Admit date: 09/09/2016 Discharge date: 09/15/2016  Admission Diagnoses:  Discharge Diagnoses:  Active Problems:   Leukocytosis   Hb-SS disease with crisis [facility])   Discharged Condition: good  Hospital Course:Patient is admitted with sickle cell painful crisis. She also had hemolytic crisis. She was treated with IV Dilaudid PCA and Toradol. She was weak and tired. Had significant hemolysis. Was transfused 1 unit PRBC. Hb is now stable. Patient also had UTI and was treated with macrodantin 500 mg BID for 5 days. Patient has leg ulcers and had santyl dressing.  Consults: None  Significant Diagnostic Studies: labs: CBCs and CMPs and was anemic to the point of transfusion.   Treatments: IV hydration, antibiotics: Macrodantin, analgesia: Dilaudid and Blood transfusion.  Discharge Exam: Blood pressure (!) 151/86, pulse 89, temperature 98.6 F (37 C), temperature source Oral, resp. rate 17, height 5\' 7"  (1.702 m), weight 61.7 kg (136 lb 0.4 oz), last menstrual period 08/12/2016, SpO2 100 %. General appearance: alert, cooperative, appears stated age and no distress Back: symmetric, no curvature. ROM normal. No CVA tenderness. Resp: clear to auscultation bilaterally Chest wall: no tenderness Cardio: regular rate and rhythm, S1, S2 normal, no murmur, click, rub or gallop GI: soft, non-tender; bowel sounds normal; no masses,  no organomegaly Extremities: extremities normal, atraumatic, no cyanosis or edema Pulses: 2+ and symmetric Skin: Skin color, texture, turgor normal. No rashes or lesions Neurologic: Grossly normal  Disposition: 01-Home or Self Care  Discharge Instructions    Diet - low sodium heart healthy    Complete by:  As directed    Increase activity slowly    Complete by:  As directed      Allergies as of 09/15/2016   No Known Allergies     Medication List    TAKE  these medications   collagenase ointment Commonly known as:  SANTYL Apply 1 application topically daily. Pt applies to right foot.   folic acid 1 MG tablet Commonly known as:  FOLVITE Take 1 tablet (1 mg total) by mouth daily.   ibuprofen 600 MG tablet Commonly known as:  ADVIL,MOTRIN Take 1 tablet (600 mg total) by mouth every 8 (eight) hours as needed. What changed:  reasons to take this   L-glutamine 5 g Pack Powder Packet Commonly known as:  ENDARI Take 5 g by mouth 2 (two) times daily.   Oxycodone HCl 10 MG Tabs Take 1 tablet (10 mg total) by mouth every 4 (four) hours as needed (pain).   Vitamin D 2000 units Caps Take 2 capsules (4,000 Units total) by mouth daily.        SignedBarbette Merino 09/15/2016, 8:27 AM   Time spent 34 minutes

## 2016-09-16 LAB — CBC WITH DIFFERENTIAL/PLATELET
BASOS ABS: 0.1 10*3/uL (ref 0.0–0.1)
BASOS PCT: 0 %
EOS PCT: 5 %
Eosinophils Absolute: 0.7 10*3/uL (ref 0.0–0.7)
HEMATOCRIT: 20.9 % — AB (ref 36.0–46.0)
Hemoglobin: 7.3 g/dL — ABNORMAL LOW (ref 12.0–15.0)
Lymphocytes Relative: 13 %
Lymphs Abs: 2.1 10*3/uL (ref 0.7–4.0)
MCH: 29.3 pg (ref 26.0–34.0)
MCHC: 34.9 g/dL (ref 30.0–36.0)
MCV: 83.9 fL (ref 78.0–100.0)
MONO ABS: 2 10*3/uL — AB (ref 0.1–1.0)
MONOS PCT: 13 %
Neutro Abs: 10.8 10*3/uL — ABNORMAL HIGH (ref 1.7–7.7)
Neutrophils Relative %: 69 %
PLATELETS: 392 10*3/uL (ref 150–400)
RBC: 2.49 MIL/uL — ABNORMAL LOW (ref 3.87–5.11)
RDW: 19.5 % — ABNORMAL HIGH (ref 11.5–15.5)
WBC: 15.7 10*3/uL — ABNORMAL HIGH (ref 4.0–10.5)

## 2016-09-16 LAB — TYPE AND SCREEN
ABO/RH(D): B POS
ANTIBODY SCREEN: NEGATIVE
Unit division: 0

## 2016-09-16 LAB — BPAM RBC
Blood Product Expiration Date: 201805212359
ISSUE DATE / TIME: 201804281528
UNIT TYPE AND RH: 5100

## 2016-10-02 ENCOUNTER — Telehealth: Payer: Self-pay

## 2016-10-03 ENCOUNTER — Telehealth: Payer: Self-pay

## 2016-10-04 ENCOUNTER — Other Ambulatory Visit: Payer: Self-pay | Admitting: Family Medicine

## 2016-10-04 DIAGNOSIS — D571 Sickle-cell disease 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: Secondary | ICD-10-CM

## 2016-10-04 DIAGNOSIS — G894 Chronic pain syndrome: Secondary | ICD-10-CM

## 2016-10-04 MED ORDER — OXYCODONE HCL 10 MG PO TABS: 10.0000 mg | ORAL_TABLET | ORAL | 0 refills | Status: DC | PRN

## 2016-10-04 MED FILL — IBUPROFEN 600 MG TABLET: 600 | 20 days supply | Qty: 60 | Fill #1

## 2016-10-04 MED FILL — ENDARI 5 GM PKG: 5 | 30 days supply | Qty: 60 | Fill #1

## 2016-10-04 MED FILL — oxyCODONE HCL 10 MG TABS: 10 | 15 days supply | Qty: 90 | Fill #0

## 2016-10-04 NOTE — Progress Notes (Signed)
Reviewed [place] Substance Reporting system prior to prescribing opiate medications. No inconsistencies noted.    Meds ordered this encounter  Medications  . Oxycodone HCl 10 MG TABS    Sig: Take 1 tablet (10 mg total) by mouth every 4 (four) hours as needed (pain).    Dispense:  90 tablet    Refill:  0    Order Specific Question:   Supervising Provider    Answer:   Tresa Garter [[id number]]    [name]  MSN, FNP-C [place] [address] [place], [place] [address] [phone]

## 2016-10-08 ENCOUNTER — Other Ambulatory Visit: Payer: Self-pay | Admitting: Internal Medicine

## 2016-10-31 ENCOUNTER — Telehealth: Payer: Self-pay

## 2016-11-01 ENCOUNTER — Other Ambulatory Visit: Payer: Self-pay | Admitting: Family Medicine

## 2016-11-01 DIAGNOSIS — G894 Chronic pain syndrome: Secondary | ICD-10-CM

## 2016-11-01 DIAGNOSIS — D571 Sickle-cell disease 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: Secondary | ICD-10-CM

## 2016-11-01 MED ORDER — OXYCODONE HCL 10 MG PO TABS: 10.0000 mg | ORAL_TABLET | ORAL | 0 refills | Status: DC | PRN

## 2016-11-01 NOTE — Progress Notes (Signed)
Reviewed [place] Substance Reporting system prior to prescribing opiate medications. No inconsistencies noted.    Meds ordered this encounter  Medications  . Oxycodone HCl 10 MG TABS    Sig: Take 1 tablet (10 mg total) by mouth every 4 (four) hours as needed (pain).    Dispense:  90 tablet    Refill:  0    Order Specific Question:   Supervising Provider    Answer:   Tresa Garter [[id number]]    [name]  MSN, FNP-C [place] [address] [place], [place] [address] [phone]

## 2016-11-06 ENCOUNTER — Telehealth: Payer: Self-pay

## 2016-11-06 DIAGNOSIS — D571 Sickle-cell disease 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: Secondary | ICD-10-CM

## 2016-11-07 MED ORDER — IBUPROFEN 600 MG PO TABS: 600.0000 mg | ORAL_TABLET | Freq: Three times a day (TID) | ORAL | 1 refills | Status: DC | PRN

## 2016-11-07 NOTE — Telephone Encounter (Signed)
Refill for ibuprofen sent into pharmacy. Thanks!  

## 2016-11-08 ENCOUNTER — Emergency Department (HOSPITAL_COMMUNITY): Payer: Medicaid Other

## 2016-11-08 ENCOUNTER — Encounter (HOSPITAL_COMMUNITY): Payer: Self-pay | Admitting: Nurse Practitioner

## 2016-11-08 ENCOUNTER — Inpatient Hospital Stay (HOSPITAL_COMMUNITY)
Admission: EM | Admit: 2016-11-08 | Discharge: 2016-11-14 | DRG: 812 | Disposition: A | Discharge status: Home / Self Care | Payer: Medicaid Other | Attending: Internal Medicine | Admitting: Internal Medicine

## 2016-11-08 ENCOUNTER — Emergency Department (HOSPITAL_COMMUNITY)
Admission: EM | Admit: 2016-11-08 | Discharge: 2016-11-08 | Disposition: A | Discharge status: Home / Self Care | Payer: Medicaid Other | Source: Home / Self Care | Attending: Emergency Medicine | Admitting: Emergency Medicine

## 2016-11-08 ENCOUNTER — Telehealth (HOSPITAL_COMMUNITY): Payer: Self-pay | Admitting: Internal Medicine

## 2016-11-08 DIAGNOSIS — Z832 Family history of diseases of the blood and blood-forming organs and certain disorders involving the immune mechanism: Secondary | ICD-10-CM

## 2016-11-08 DIAGNOSIS — D5700 Hb-SS disease with crisis, unspecified: Secondary | ICD-10-CM

## 2016-11-08 DIAGNOSIS — R6510 Systemic inflammatory response syndrome (SIRS) of non-infectious origin without acute organ dysfunction: Secondary | ICD-10-CM | POA: Diagnosis present

## 2016-11-08 DIAGNOSIS — Z8249 Family history of ischemic heart disease and other diseases of the circulatory system: Secondary | ICD-10-CM

## 2016-11-08 DIAGNOSIS — D72829 Elevated white blood cell count, unspecified: Secondary | ICD-10-CM | POA: Diagnosis present

## 2016-11-08 DIAGNOSIS — O99019 Anemia complicating pregnancy, unspecified trimester: Secondary | ICD-10-CM

## 2016-11-08 DIAGNOSIS — D638 Anemia in other chronic diseases classified elsewhere: Secondary | ICD-10-CM | POA: Diagnosis present

## 2016-11-08 DIAGNOSIS — R0902 Hypoxemia: Secondary | ICD-10-CM | POA: Diagnosis present

## 2016-11-08 DIAGNOSIS — D571 Sickle-cell disease 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: Secondary | ICD-10-CM | POA: Diagnosis present

## 2016-11-08 DIAGNOSIS — Z825 Family history of asthma and other chronic lower respiratory diseases: Secondary | ICD-10-CM

## 2016-11-08 DIAGNOSIS — L97319 Non-pressure chronic ulcer of right ankle with unspecified severity: Secondary | ICD-10-CM | POA: Diagnosis present

## 2016-11-08 DIAGNOSIS — J9811 Atelectasis: Secondary | ICD-10-CM | POA: Diagnosis present

## 2016-11-08 DIAGNOSIS — I517 Cardiomegaly: Secondary | ICD-10-CM | POA: Diagnosis present

## 2016-11-08 DIAGNOSIS — Z833 Family history of diabetes mellitus: Secondary | ICD-10-CM

## 2016-11-08 DIAGNOSIS — E876 Hypokalemia: Secondary | ICD-10-CM | POA: Diagnosis present

## 2016-11-08 DIAGNOSIS — R791 Abnormal coagulation profile: Secondary | ICD-10-CM | POA: Diagnosis present

## 2016-11-08 DIAGNOSIS — D5701 Hb-SS disease with acute chest syndrome: Principal | ICD-10-CM | POA: Diagnosis present

## 2016-11-08 DIAGNOSIS — Z3490 Encounter for supervision of normal pregnancy, unspecified, unspecified trimester: Secondary | ICD-10-CM

## 2016-11-08 DIAGNOSIS — K5900 Constipation, unspecified: Secondary | ICD-10-CM | POA: Diagnosis present

## 2016-11-08 DIAGNOSIS — I872 Venous insufficiency (chronic) (peripheral): Secondary | ICD-10-CM | POA: Diagnosis present

## 2016-11-08 LAB — CBC WITH DIFFERENTIAL/PLATELET
Basophils Absolute: 0 10*3/uL (ref 0.0–0.1)
Basophils Relative: 0 %
EOS PCT: 0 %
Eosinophils Absolute: 0 10*3/uL (ref 0.0–0.7)
HEMATOCRIT: 20.1 % — AB (ref 36.0–46.0)
HEMOGLOBIN: 7.1 g/dL — AB (ref 12.0–15.0)
LYMPHS PCT: 11 %
Lymphs Abs: 2.8 10*3/uL (ref 0.7–4.0)
MCH: 31.4 pg (ref 26.0–34.0)
MCHC: 35.3 g/dL (ref 30.0–36.0)
MCV: 88.9 fL (ref 78.0–100.0)
MONOS PCT: 13 %
Monocytes Absolute: 3.4 10*3/uL — ABNORMAL HIGH (ref 0.1–1.0)
NEUTROS ABS: 19.7 10*3/uL — AB (ref 1.7–7.7)
NRBC: 16 /100{WBCs} — AB
Neutrophils Relative %: 76 %
Platelets: 440 10*3/uL — ABNORMAL HIGH (ref 150–400)
RBC: 2.26 MIL/uL — AB (ref 3.87–5.11)
RDW: 24.7 % — ABNORMAL HIGH (ref 11.5–15.5)
WBC: 25.9 10*3/uL — AB (ref 4.0–10.5)

## 2016-11-08 LAB — COMPREHENSIVE METABOLIC PANEL
ALT: 23 U/L (ref 14–54)
AST: 34 U/L (ref 15–41)
Albumin: 4.5 g/dL (ref 3.5–5.0)
Alkaline Phosphatase: 49 U/L (ref 38–126)
Anion gap: 9 (ref 5–15)
BUN: 7 mg/dL (ref 6–20)
CHLORIDE: 105 mmol/L (ref 101–111)
CO2: 24 mmol/L (ref 22–32)
CREATININE: 0.31 mg/dL — AB (ref 0.44–1.00)
Calcium: 9.1 mg/dL (ref 8.9–10.3)
GFR calc Af Amer: >60 mL/min (ref >60)
GFR calc non Af Amer: >60 mL/min (ref >60)
Glucose, Bld: 97 mg/dL (ref 65–99)
POTASSIUM: 3.7 mmol/L (ref 3.5–5.1)
SODIUM: 138 mmol/L (ref 135–145)
Total Bilirubin: 4.3 mg/dL — ABNORMAL HIGH (ref 0.3–1.2)
Total Protein: 7.9 g/dL (ref 6.5–8.1)

## 2016-11-08 LAB — HCG, QUANTITATIVE, PREGNANCY: hCG, Beta Chain, Quant, S: 524 m[IU]/mL — ABNORMAL HIGH (ref <5)

## 2016-11-08 LAB — RETICULOCYTES: RBC.: 2.26 MIL/uL — AB (ref 3.87–5.11)

## 2016-11-08 LAB — POC URINE PREG, ED: Preg Test, Ur: POSITIVE — AB

## 2016-11-08 MED ORDER — DEXTROSE-NACL 5-0.45 % IV SOLN
INTRAVENOUS | Status: DC
  Administered 2016-11-09: 01:00:00 via INTRAVENOUS

## 2016-11-08 MED ORDER — SODIUM CHLORIDE 0.45 % IV SOLN
INTRAVENOUS | Status: DC
Start: 2016-11-08 — End: 2016-11-08
  Administered 2016-11-08: 12:00:00 via INTRAVENOUS

## 2016-11-08 MED ORDER — SODIUM CHLORIDE 0.45 % IV SOLN
INTRAVENOUS | Status: DC
  Administered 2016-11-08: via INTRAVENOUS

## 2016-11-08 MED ORDER — HYDROMORPHONE HCL 1 MG/ML IJ SOLN: 2.0000 mg | INTRAMUSCULAR | Status: AC

## 2016-11-08 MED ORDER — DIPHENHYDRAMINE HCL 50 MG/ML IJ SOLN
25.0000 mg | Freq: Once | INTRAMUSCULAR | Status: AC
  Administered 2016-11-08: 25 mg via INTRAVENOUS
  Filled 2016-11-08: qty 1

## 2016-11-08 MED ORDER — KETOROLAC TROMETHAMINE 15 MG/ML IJ SOLN
15.0000 mg | INTRAMUSCULAR | Status: AC
  Administered 2016-11-08: 15 mg via INTRAVENOUS
  Filled 2016-11-08: qty 1

## 2016-11-08 MED ORDER — HYDROMORPHONE HCL 1 MG/ML IJ SOLN
2.0000 mg | INTRAMUSCULAR | Status: AC
  Administered 2016-11-08: 2 mg via INTRAVENOUS
  Filled 2016-11-08: qty 2

## 2016-11-08 MED ORDER — HYDROMORPHONE HCL 1 MG/ML IJ SOLN
2.0000 mg | INTRAMUSCULAR | Status: AC
Start: 2016-11-08 — End: 2016-11-08
  Administered 2016-11-08: 2 mg via INTRAVENOUS
  Filled 2016-11-08: qty 2

## 2016-11-08 MED ORDER — HYDROMORPHONE HCL 1 MG/ML IJ SOLN
2.0000 mg | INTRAMUSCULAR | Status: AC
  Administered 2016-11-09: 2 mg via INTRAVENOUS
  Filled 2016-11-08: qty 2

## 2016-11-08 MED ORDER — HYDROMORPHONE HCL 1 MG/ML IJ SOLN
2.0000 mg | INTRAMUSCULAR | Status: AC
Start: 2016-11-09 — End: 2016-11-08

## 2016-11-08 MED ORDER — HYDROMORPHONE HCL 1 MG/ML IJ SOLN
2.0000 mg | INTRAMUSCULAR | Status: AC
Start: 2016-11-09 — End: 2016-11-09
  Administered 2016-11-09: 2 mg via INTRAVENOUS
  Filled 2016-11-08: qty 2

## 2016-11-08 MED ORDER — SILVER SULFADIAZINE 1 % EX CREA
TOPICAL_CREAM | Freq: Once | CUTANEOUS | Status: AC
  Administered 2016-11-08: 1 via TOPICAL
  Filled 2016-11-08: qty 50

## 2016-11-08 MED ORDER — HYDROMORPHONE HCL 1 MG/ML IJ SOLN
2.0000 mg | INTRAMUSCULAR | Status: AC
Start: 2016-11-09 — End: 2016-11-08
  Administered 2016-11-08: 2 mg via INTRAVENOUS
  Filled 2016-11-08: qty 2

## 2016-11-08 NOTE — ED Triage Notes (Signed)
Pt state she was in the department earlier today for SCC pain and advised to come back if her pain did not improve. Add that she was told she would need to be admitted. Rates pain 10/10.

## 2016-11-08 NOTE — Discharge Instructions (Signed)
You have been evaluated for your sickle cell pain. Please continue with your current pain management.  You are currently pregnant, follow up with your OBGYN or with [facility] next week for further care.  Return if your condition worsen or if you have other concerns.

## 2016-11-08 NOTE — ED Notes (Signed)
Assisted patient to bathroom. Patient unable to void. Fell asleep on toilet. MD notified.

## 2016-11-08 NOTE — ED Triage Notes (Signed)
Pt presents with chest pain that began last night.  Pt also has sickle cell and states this pain feels like it.  Pt reports SOB but no diaphoresis noted.  Pt also reports right lower leg pain.

## 2016-11-08 NOTE — ED Notes (Signed)
Patient transported to X-ray 

## 2016-11-08 NOTE — ED Provider Notes (Signed)
[facility] DEPT Provider Note   CSN: [id number] Arrival date & time: 11/08/16  2201  By signing my name below, I, Margit Banda, attest that this documentation has been prepared under the direction and in the presence of Varney Biles, MD. Electronically Signed: Margit Banda, ED Scribe. 11/08/16. 11:24 PM.  History   Chief Complaint Chief Complaint  Patient presents with  . Sickle Cell Pain Crisis    HPI [name] is a 46 y.o. female with a PMHx of sickle cell who presents to the Emergency Department complaining of 10/10, worsening, central chest pain that started yesterday. Pt was seen this morning for same onset and was told to come back if pain did not improve. She was also told she was pregnant at that time. Pt also c/o of back pain and right lower leg pain that started yesterday as well. She has a wound to her RLE and is not sure how it started. Pt notes bleeding and drainage from the wound, and currently goes to the wound center. Pt reports having sickle cell pain crisis ~ once a month and she will take 10 mg of oxycodone for pain. Pt was last pregnant ~ December/January and miscarried. She was not trying to get pregnant, but is not on BCP or using condoms. No hx of blood clots or heart attacks. Pt denies fever, cough, SOB, nausea, vomiting, chills, vaginal bleeding, and abdominal pain.  PCP: [name], FNP  The history is provided by the patient. No language interpreter was used.    Past Medical History:  Diagnosis Date  . Anemia 03/30/2012   Hx of sickle cell disease  . Anemia complicating pregnancy 2/59/5638  . CAP (community acquired pneumonia)    2014  . Cholelithiasis y-1  . Cholelithiasis   . Elevated LFTs   . Leg ulcer ([place]) 10/27/2012   Chronic under care of wound clinic  . Multiple open wounds of lower extremity    chronic wounds B/LLE  . Sickle cell disease [facility])     Patient Active Problem List   Diagnosis Date Noted  . 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 Hb-SS disease 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 ([place]) 03/24/2014  . Previous cesarean delivery, antepartum condition or complication 75/64/3329  . Hemochromatosis 11/10/2012  . Chronic pain syndrome 10/13/2012  . Sinus bradycardia by electrocardiogram 04/03/2012  . Chronic wound of extremity 04/01/2012  . Cholelithiasis 03/30/2012  . Leukocytosis 03/30/2012    Past Surgical History:  Procedure Laterality Date  . ALLOGRAFT APPLICATION Right 10/05/8414   Procedure: SURGICAL PREP FOR GRAFTING RIGHT LOWER EXTREMITY AND APPLICATION OF Jannifer Hick;  Surgeon: Irene Limbo, MD;  Location: [facility];  Service: Plastics;  Laterality: Right;  . APPLICATION OF A-CELL OF EXTREMITY Right 10/09/2015   Procedure: APPLICATION OF Jannifer Hick;  Surgeon: Irene Limbo, MD;  Location: [facility];  Service: Plastics;  Laterality: Right;  . BREAST SURGERY     left breast cyst aspiration  . CESAREAN SECTION    . CESAREAN SECTION N/A 01/06/2014   Procedure: CESAREAN SECTION;  Surgeon: Emily Filbert, MD;  Location: [facility];  Service: Obstetrics;  Laterality: N/A;  . I&D EXTREMITY Right 10/09/2015   Procedure: SURGICAL PREPARATION FOR GRAFTING RIGHT ANKLE AND APPLICATION THERASKIN;  Surgeon: Irene Limbo, MD;  Location: [facility];  Service: Plastics;  Laterality: Right;  . SKIN FULL THICKNESS GRAFT Bilateral 06/17/2016   Procedure: SURGICAL PREP FOR GRAFTING, BILATERAL LOWER EXTREMITIES AND APPLICATION OF THERASKIN;  Surgeon: Irene Limbo,  MD;  Location: [facility];  Service: Plastics;  Laterality: Bilateral;  . TONSILLECTOMY      OB History    Gravida Para Term Preterm AB Living   8 4 1 3 2 2    SAB TAB Ectopic Multiple Live Births   2       3       Home Medications    Prior to Admission medications   Medication Sig Start Date End Date Taking? Authorizing Provider  Cholecalciferol (VITAMIN D) 2000 units CAPS Take 2 capsules (4,000 Units total)  by mouth daily. 06/03/16  Yes [name], FNP  collagenase (SANTYL) ointment Apply 1 application topically daily. Pt applies to right foot.   Yes [provider]  folic acid (FOLVITE) 1 MG tablet Take 1 tablet (1 mg total) by mouth daily. 03/21/16  Yes [name], NP  ibuprofen (ADVIL,MOTRIN) 600 MG tablet Take 1 tablet (600 mg total) by mouth every 8 (eight) hours as needed. Patient taking differently: Take 600 mg by mouth every 8 (eight) hours as needed for mild pain or moderate pain.  11/07/16  Yes [name], FNP  L-glutamine (ENDARI) 5 g PACK Powder Packet Take 5 g by mouth 2 (two) times daily. 08/29/16  Yes [name], FNP  Oxycodone HCl 10 MG TABS Take 1 tablet (10 mg total) by mouth every 4 (four) hours as needed (pain). 11/01/16  Yes [name], FNP    Family History Family History  Problem Relation Age of Onset  . Sickle cell anemia Sister   . Diabetes Mother   . Asthma Mother   . Sickle cell trait Mother   . Sickle cell trait Father   . Hypertension Father     Social History Social History  Substance Use Topics  . Smoking status: Never Smoker  . Smokeless tobacco: Never Used  . Alcohol use No     Allergies   Patient has no known allergies.   Review of Systems Review of Systems  Constitutional: Negative for chills and fever.  Respiratory: Negative for cough and shortness of breath.   Cardiovascular: Positive for chest pain.  Gastrointestinal: Negative for abdominal pain, nausea and vomiting.  Genitourinary: Negative for vaginal bleeding.  Musculoskeletal: Positive for back pain.  Skin: Positive for wound.  All other systems reviewed and are negative.    Physical Exam Updated Vital Signs BP 113/81 (BP Location: Right Arm)   Pulse 75   Temp 98.6 F (37 C) (Oral)   Resp 19   Wt 58 kg (127 lb 13.9 oz)   LMP 10/10/2016 Comment: patient double shielded  SpO2 97%   BMI 20.03 kg/m   Physical Exam  Constitutional:  She is oriented to person, place, and time. She appears well-developed and well-nourished.  HENT:  Head: Normocephalic.  Eyes: EOM are normal. Scleral icterus is present.  Neck: Normal range of motion.  No JVD.   Cardiovascular: Tachycardia present.   Pulmonary/Chest: Effort normal.  Lungs are clear to ausculation. Respirations are shallow.   Abdominal: She exhibits no distension.  Musculoskeletal: Normal range of motion.  Lateral calf tenderness in RLE. 8 cm by 5 cm wound over medial ankle. 7 cm by 3 cm wound over lateral ankle.  Neurological: She is alert and oriented to person, place, and time.  Psychiatric: She has a normal mood and affect.  Nursing note and vitals reviewed.    ED Treatments / Results  DIAGNOSTIC STUDIES: Oxygen Saturation is 100% on [place],  normal by my interpretation.   COORDINATION OF CARE: 11:24 PM-Discussed next steps with pt which includes being admitted. Pt verbalized understanding and is agreeable with the plan.   Labs (all labs ordered are listed, but only abnormal results are displayed) Labs Reviewed  COMPREHENSIVE METABOLIC PANEL - Abnormal; Notable for the following:       Result Value   Potassium 3.4 (*)    Glucose, Bld 109 (*)    Creatinine, Ser 0.34 (*)    Total Protein 8.4 (*)    Total Bilirubin 6.4 (*)    All other components within normal limits  D-DIMER, QUANTITATIVE (NOT AT [facility]) - Abnormal; Notable for the following:    D-Dimer, Quant 3.39 (*)    All other components within normal limits  CULTURE, BLOOD (ROUTINE X 2)  CULTURE, BLOOD (ROUTINE X 2)  I-STAT TROPOININ, ED  I-STAT CG4 LACTIC ACID, ED  TYPE AND SCREEN    EKG  EKG Interpretation  Date/Time:  Saturday November 09 2016 00:23:55 EDT Ventricular Rate:  86 PR Interval:    QRS Duration: 86 QT Interval:  395 QTC Calculation: 473 R Axis:   44 Text Interpretation:  Sinus rhythm Nonspecific repol abnormality, diffuse leads No acute changes TWI in lead III is new Confirmed by  Varney Biles [phone]) on 11/09/2016 1:39:12 AM       Radiology Dg Chest 2 View  Result Date: 11/09/2016 CLINICAL DATA:  Acute onset of cough and generalized chest pain. Initial encounter. EXAM: CHEST  2 VIEW COMPARISON:  Chest radiograph performed 11/08/2016 FINDINGS: Right midlung opacity may reflect mild acute chest syndrome. Left basilar airspace opacity likely reflects atelectasis. The lungs are hypoexpanded. Mild vascular congestion is noted. The heart is mildly enlarged. No acute osseous abnormalities are identified. IMPRESSION: 1. Right midlung airspace opacity may reflect mild acute chest syndrome. 2. Mild left basilar opacity likely reflects atelectasis. Lungs hypoexpanded. 3. Mild vascular congestion and mild cardiomegaly noted. Electronically Signed   By: Garald Balding M.D.   On: 11/09/2016 01:17   Dg Chest 2 View  Result Date: 11/08/2016 CLINICAL DATA:  Chest pain EXAM: CHEST  2 VIEW COMPARISON:  09/09/2016 FINDINGS: Heart is enlarged. Bibasilar atelectasis. No effusions or edema. No acute bony abnormality. IMPRESSION: Cardiomegaly.  Bibasilar atelectasis. Electronically Signed   By: Rolm Baptise M.D.   On: 11/08/2016 11:35    Procedures Procedures (including critical care time)  CRITICAL CARE Performed by: Varney Biles   Total critical care time: 42 minutes  Critical care time was exclusive of separately billable procedures and treating other patients.  Critical care was necessary to treat or prevent imminent or life-threatening deterioration.  Critical care was time spent personally by me on the following activities: development of treatment plan with patient and/or surrogate as well as nursing, discussions with consultants, evaluation of patient's response to treatment, examination of patient, obtaining history from patient or surrogate, ordering and performing treatments and interventions, ordering and review of laboratory studies, ordering and review of radiographic  studies, pulse oximetry and re-evaluation of patient's condition.   Medications Ordered in ED Medications  0.45 % sodium chloride infusion ( Intravenous New Bag/Given 11/08/16 2351)  dextrose 5 %-0.45 % sodium chloride infusion ( Intravenous New Bag/Given 11/09/16 0121)  levofloxacin (LEVAQUIN) IVPB 750 mg (not administered)  HYDROmorphone (DILAUDID) injection 2 mg (2 mg Intravenous Given 11/08/16 2351)    Or  HYDROmorphone (DILAUDID) injection 2 mg ( Subcutaneous See Alternative 11/08/16 2351)  HYDROmorphone (DILAUDID) injection 2 mg (2 mg  Intravenous Given 11/09/16 0026)    Or  HYDROmorphone (DILAUDID) injection 2 mg ( Subcutaneous See Alternative 11/09/16 0026)  HYDROmorphone (DILAUDID) injection 2 mg (2 mg Intravenous Given 11/09/16 0119)    Or  HYDROmorphone (DILAUDID) injection 2 mg ( Subcutaneous See Alternative 11/09/16 0119)  silver sulfADIAZINE (SILVADENE) 1 % cream (1 application Topical Given 11/08/16 2351)     Initial Impression / Assessment and Plan / ED Course  I have reviewed the triage vital signs and the nursing notes.  Pertinent labs & imaging results that were available during my care of the patient were reviewed by me and considered in my medical decision making (see chart for details).  Clinical Course as of Nov 10 147  Sat Nov 09, 2016  0148 US DVT ordered. With CXR showing acute chest, I dont think a PE study is needed. I understand that fat emboli can cause acute chest, but with no profound hypoxia or resp distress  and pt being pregnant, it is better to wait for now rather than get a stat CT. D-Dimer, Quant: (!) 3.39 [AN]  0149 Acute chest syndrome confirmed. Results from the ER workup discussed with the patient face to face and all questions answered to the best of my ability.  We will admit. DG Chest 2 View [AN]    Clinical Course User Index [AN] Varney Biles, MD    Pt comes in with cc of chest pain. Chest pain is severe, and typical of her sickle  cell. Patient was seen earlier today. She had elevated WC, she had elevated retic. Pt's CXR was clear, and her pain was improved, so she was sent home. She returns to the ER as the pain returned. No cough or dib, and the pain is not pleuritic. Pt has no hx of PE. She however does complain of leg pain in the R calf and was found to be pregnant last visit. Pt also delivered a baby in Jan - so essentially she has SCD which puts her at high risk of thromboembolism and has had high estrogen state the last several months. DVT is in the consideration for the leg pain, I will order dimer. And since we are considering DVT, PE is also in the ddx. Again - chest pain is not pleuritic, there is no dib. Although the morning Xrays were neg, I am going to repeat CXR. Lungs sound clear, but pt looks comfortable, and I am concerned about ACS. WE will also get trops and ekg. If the Xrays are neg, we will definitely consider CT of the chest. I anticipate that pt will need admission.    Final Clinical Impressions(s) / ED Diagnoses   Final diagnoses:  Acute chest syndrome ([place])  Sickle cell pain crisis ([place])    New Prescriptions New Prescriptions   No medications on file   I personally performed the services described in this documentation, which was scribed in my presence. The recorded information has been reviewed and is accurate.    Varney Biles, MD 11/09/16 [phone]

## 2016-11-08 NOTE — Telephone Encounter (Signed)
Pt called and states experiencing pain in mid chest and having some shortness of breath; pt states that she believes it is related to her sickle cell disease and has been occurring overnight; pt advised that she should go the emergency room for evaluation; pt verbalizes understanding

## 2016-11-08 NOTE — ED Provider Notes (Signed)
[facility] DEPT Provider Note   CSN: [id number] Arrival date & time: 11/08/16  1007     History   Chief Complaint Chief Complaint  Patient presents with  . Chest Pain  . Sickle Cell Pain Crisis    HPI [name] is a 46 y.o. female.  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 Patient report acute onset of pain to her chest and her right leg that started early this morning. Pain is described as a tightness throbbing sensation, persistent, endorses of breath when the pain is intense. Pain is also affecting her right leg with similar intensity. She tried taking home pain medication with minimal improvement. She denies having fever, productive cough, hemoptysis, nausea vomiting diarrhea or rash. She is unable to pinpoint what caused her symptoms, however felt similar to prior sickle cell pain. She does think that this pain may be related to her usual menstruation as it normally present when she has her menstrual. However, sts LMP was in May.  Unsure if she is pregnant but sts she is sexually active.  Pt did reach out to the sickle cell clinic this AM for her pain, however with her ongoing chest pain, the clinic recommend pt to come to the ER for further evaluation.     Past Medical History:  Diagnosis Date  . Anemia 03/30/2012   Hx of sickle cell disease  . Anemia complicating pregnancy 08/26/8117  . CAP (community acquired pneumonia)    2014  . Cholelithiasis y-1  . Cholelithiasis   . Elevated LFTs   . Leg ulcer ([place]) 10/27/2012   Chronic under care of wound clinic  . Multiple open wounds of lower extremity    chronic wounds B/LLE  . Sickle cell disease [facility])     Patient Active Problem List   Diagnosis Date Noted  . Hb-SS disease with crisis ([place]) 08/16/2016  . Hb-SS disease 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 ([place]) 03/24/2014  . Previous cesarean delivery, antepartum condition or complication 14/78/2956    . Hemochromatosis 11/10/2012  . Chronic pain syndrome 10/13/2012  . Sinus bradycardia by electrocardiogram 04/03/2012  . Chronic wound of extremity 04/01/2012  . Cholelithiasis 03/30/2012  . Leukocytosis 03/30/2012    Past Surgical History:  Procedure Laterality Date  . ALLOGRAFT APPLICATION Right 07/03/863   Procedure: SURGICAL PREP FOR GRAFTING RIGHT LOWER EXTREMITY AND APPLICATION OF Jannifer Hick;  Surgeon: Irene Limbo, MD;  Location: [place];  Service: Plastics;  Laterality: Right;  . APPLICATION OF A-CELL OF EXTREMITY Right 10/09/2015   Procedure: APPLICATION OF Jannifer Hick;  Surgeon: Irene Limbo, MD;  Location: [place];  Service: Plastics;  Laterality: Right;  . BREAST SURGERY     left breast cyst aspiration  . CESAREAN SECTION    . CESAREAN SECTION N/A 01/06/2014   Procedure: CESAREAN SECTION;  Surgeon: Emily Filbert, MD;  Location: [place] [facility];  Service: Obstetrics;  Laterality: N/A;  . I&D EXTREMITY Right 10/09/2015   Procedure: SURGICAL PREPARATION FOR GRAFTING RIGHT ANKLE AND APPLICATION THERASKIN;  Surgeon: Irene Limbo, MD;  Location: [place];  Service: Plastics;  Laterality: Right;  . SKIN FULL THICKNESS GRAFT Bilateral 06/17/2016   Procedure: SURGICAL PREP FOR GRAFTING, BILATERAL LOWER EXTREMITIES AND APPLICATION OF Jannifer Hick;  Surgeon: Irene Limbo, MD;  Location: [place];  Service: Plastics;  Laterality: Bilateral;  . TONSILLECTOMY      OB History    Gravida Para Term Preterm AB Living  7 4 1 3 2 2    SAB TAB Ectopic Multiple Live Births   2       3       Home Medications    Prior to Admission medications   Medication Sig Start Date End Date Taking? Authorizing Provider  Cholecalciferol (VITAMIN D) 2000 units CAPS Take 2 capsules (4,000 Units total) by mouth daily. 06/03/16   [name], FNP  collagenase (SANTYL) ointment Apply 1 application topically daily. Pt applies to right foot.    [provider]  folic acid (FOLVITE) 1 MG tablet Take 1 tablet (1 mg total) by mouth daily. 03/21/16   [name], NP  ibuprofen (ADVIL,MOTRIN) 600 MG tablet Take 1 tablet (600 mg total) by mouth every 8 (eight) hours as needed. 11/07/16   [name], FNP  L-glutamine (ENDARI) 5 g PACK Powder Packet Take 5 g by mouth 2 (two) times daily. 08/29/16   [name], FNP  Oxycodone HCl 10 MG TABS Take 1 tablet (10 mg total) by mouth every 4 (four) hours as needed (pain). 11/01/16   [name], FNP    Family History Family History  Problem Relation Age of Onset  . Sickle cell anemia Sister   . Diabetes Mother   . Asthma Mother   . Sickle cell trait Mother   . Sickle cell trait Father   . Hypertension Father     Social History Social History  Substance Use Topics  . Smoking status: Never Smoker  . Smokeless tobacco: Never Used  . Alcohol use No     Allergies   Patient has no known allergies.   Review of Systems Review of Systems  All other systems reviewed and are negative.    Physical Exam Updated Vital Signs BP 127/73 (BP Location: Left Arm)   Pulse 85   Temp 97.8 F (36.6 C) (Oral)   LMP 03/19/2016 Comment: patient double shielded  SpO2 96%   Physical Exam  Constitutional: She appears well-developed and well-nourished.  Patient appears uncomfortable but nontoxic  HENT:  Head: Atraumatic.  Mouth/Throat: Oropharynx is clear and moist.  Eyes: Conjunctivae are normal. Scleral icterus is present.  Neck: Neck supple.  Cardiovascular: Normal rate and regular rhythm.   Pulmonary/Chest: Effort normal and breath sounds normal.  Abdominal: Soft. Bowel sounds are normal. She exhibits no distension. There is tenderness (Tenderness to epigastric region without guarding or rebound tenderness).  Musculoskeletal: She exhibits tenderness (Tenderness to palpation of right lower extremities without any significant edema, no palpable cords or erythema. Intact  distal pedal pulses.).  Neurological: She is alert.  Skin: No rash noted.  Psychiatric: She has a normal mood and affect.  Nursing note and vitals reviewed.    ED Treatments / Results  Labs (all labs ordered are listed, but only abnormal results are displayed) Labs Reviewed  COMPREHENSIVE METABOLIC PANEL - Abnormal; Notable for the following:       Result Value   Creatinine, Ser 0.31 (*)    Total Bilirubin 4.3 (*)    All other components within normal limits  CBC WITH DIFFERENTIAL/PLATELET - Abnormal; Notable for the following:    WBC 25.9 (*)    RBC 2.26 (*)    Hemoglobin 7.1 (*)    HCT 20.1 (*)    RDW 24.7 (*)    Platelets 440 (*)    nRBC 16 (*)    Neutro Abs 19.7 (*)    Monocytes Absolute 3.4 (*)  All other components within normal limits  RETICULOCYTES - Abnormal; Notable for the following:    Retic Ct Pct >23.0 (*)    RBC. 2.26 (*)    All other components within normal limits  HCG, QUANTITATIVE, PREGNANCY - Abnormal; Notable for the following:    hCG, Beta Chain, Quant, S 524 (*)    All other components within normal limits  POC URINE PREG, ED - Abnormal; Notable for the following:    Preg Test, Ur POSITIVE (*)    All other components within normal limits  CBC WITH DIFFERENTIAL/PLATELET  POC URINE PREG, ED    EKG  EKG Interpretation None     ED ECG REPORT   Date: 11/08/2016  Rate: 89  Rhythm: normal sinus rhythm  QRS Axis: normal  Intervals: normal  ST/T Wave abnormalities: nonspecific T wave changes  Conduction Disutrbances:none  Narrative Interpretation:   Old EKG Reviewed: unchanged  I have personally reviewed the EKG tracing and agree with the computerized printout as noted.   Radiology Dg Chest 2 View  Result Date: 11/08/2016 CLINICAL DATA:  Chest pain EXAM: CHEST  2 VIEW COMPARISON:  09/09/2016 FINDINGS: Heart is enlarged. Bibasilar atelectasis. No effusions or edema. No acute bony abnormality. IMPRESSION: Cardiomegaly.  Bibasilar  atelectasis. Electronically Signed   By: Rolm Baptise M.D.   On: 11/08/2016 11:35    Procedures Procedures (including critical care time)  Medications Ordered in ED Medications  0.45 % sodium chloride infusion ( Intravenous New Bag/Given 11/08/16 1150)  HYDROmorphone (DILAUDID) injection 2 mg (2 mg Intravenous Not Given 11/08/16 1315)    Or  HYDROmorphone (DILAUDID) injection 2 mg ( Subcutaneous See Alternative 11/08/16 1315)  ketorolac (TORADOL) 15 MG/ML injection 15 mg (15 mg Intravenous Given 11/08/16 1149)  HYDROmorphone (DILAUDID) injection 2 mg (2 mg Intravenous Given 11/08/16 1148)    Or  HYDROmorphone (DILAUDID) injection 2 mg ( Subcutaneous See Alternative 11/08/16 1148)  HYDROmorphone (DILAUDID) injection 2 mg (2 mg Intravenous Given 11/08/16 1322)    Or  HYDROmorphone (DILAUDID) injection 2 mg ( Subcutaneous See Alternative 11/08/16 1322)  HYDROmorphone (DILAUDID) injection 2 mg (2 mg Intravenous Given 11/08/16 1340)    Or  HYDROmorphone (DILAUDID) injection 2 mg ( Subcutaneous See Alternative 11/08/16 1340)  diphenhydrAMINE (BENADRYL) injection 25 mg (25 mg Intravenous Given 11/08/16 1149)     Initial Impression / Assessment and Plan / ED Course  I have reviewed the triage vital signs and the nursing notes.  Pertinent labs & imaging results that were available during my care of the patient were reviewed by me and considered in my medical decision making (see chart for details).     BP 105/65   Pulse 67   Temp 97.8 F (36.6 C) (Oral)   Resp (!) 9   Ht 5\' 7"  (1.702 m)   Wt 57.2 kg (126 lb)   LMP 03/19/2016 Comment: patient double shielded  SpO2 98%   BMI 19.73 kg/m    Final Clinical Impressions(s) / ED Diagnoses   Final diagnoses:  Sickle cell crisis ([place])  Pregnancy, unspecified gestational age    [place] Prescriptions New Prescriptions   No medications on file   10:50 AM Patient here with chest pain and right leg pain similar to prior sickle cell pain crisis.  She is afebrile, no productive cough and symptoms doesn't suggest a PE. Workup initiated, will control her pain. Likely transfer to the sickle cell clinic if appropriate.  3:06 PM Quant HCG is elevated at 524. This finding  was noted after pt has received Toradol, CXR and dilaudid treatment. Patient also have an elevated WBC of 25.9, she has elevated white count in the past. I suspect this is reactive and less likely to be infectious. Her hemoglobin 7.1, near her baseline. However, patient does have an elevated total bilirubin of 4.3. She has had elevated total bilirubin in the past. Her chest x-ray today showing cardiomegaly and bibasal atelectasis without other concerning feature.  3:45 PM After receiving pain medication, patient states she feels better and comfortable going home. Patient made aware that she is currently pregnant. LMP 10/10/2016. This is an unexpected pregnancy. She has history of 7 prior pregnancy with 2 viable child. States she has had 5 miscarriage. I encouraged patient to follow-up with OB/GYN for further management of her current pregnancy. I have low suspicion for ectopic pregnancy as patient has no abdominal pain, or vaginal bleeding. She had a central return from if her condition worsen. Care discussed with Dr. Oleta Mouse.        Domenic Moras, PA-C 11/08/16 1557    Forde Dandy, MD 11/09/16 [phone]

## 2016-11-09 ENCOUNTER — Encounter (HOSPITAL_COMMUNITY): Payer: Self-pay | Admitting: Radiology

## 2016-11-09 ENCOUNTER — Emergency Department (HOSPITAL_COMMUNITY): Payer: Medicaid Other

## 2016-11-09 DIAGNOSIS — G894 Chronic pain syndrome: Secondary | ICD-10-CM | POA: Diagnosis not present

## 2016-11-09 DIAGNOSIS — R791 Abnormal coagulation profile: Secondary | ICD-10-CM | POA: Diagnosis present

## 2016-11-09 DIAGNOSIS — E876 Hypokalemia: Secondary | ICD-10-CM | POA: Diagnosis present

## 2016-11-09 DIAGNOSIS — O99019 Anemia complicating pregnancy, unspecified trimester: Secondary | ICD-10-CM

## 2016-11-09 DIAGNOSIS — M79609 Pain in unspecified limb: Secondary | ICD-10-CM | POA: Diagnosis not present

## 2016-11-09 DIAGNOSIS — D5700 Hb-SS disease with crisis, unspecified: Secondary | ICD-10-CM | POA: Diagnosis present

## 2016-11-09 DIAGNOSIS — L97319 Non-pressure chronic ulcer of right ankle with unspecified severity: Secondary | ICD-10-CM | POA: Diagnosis present

## 2016-11-09 DIAGNOSIS — D638 Anemia in other chronic diseases classified elsewhere: Secondary | ICD-10-CM | POA: Diagnosis present

## 2016-11-09 DIAGNOSIS — Z3490 Encounter for supervision of normal pregnancy, unspecified, unspecified trimester: Secondary | ICD-10-CM | POA: Diagnosis not present

## 2016-11-09 DIAGNOSIS — J9811 Atelectasis: Secondary | ICD-10-CM | POA: Diagnosis present

## 2016-11-09 DIAGNOSIS — Z833 Family history of diabetes mellitus: Secondary | ICD-10-CM | POA: Diagnosis not present

## 2016-11-09 DIAGNOSIS — Z8249 Family history of ischemic heart disease and other diseases of the circulatory system: Secondary | ICD-10-CM | POA: Diagnosis not present

## 2016-11-09 DIAGNOSIS — I83019 Varicose veins of right lower extremity with ulcer of unspecified site: Secondary | ICD-10-CM | POA: Diagnosis not present

## 2016-11-09 DIAGNOSIS — K5900 Constipation, unspecified: Secondary | ICD-10-CM | POA: Diagnosis present

## 2016-11-09 DIAGNOSIS — R0902 Hypoxemia: Secondary | ICD-10-CM | POA: Diagnosis present

## 2016-11-09 DIAGNOSIS — A419 Sepsis, unspecified organism: Secondary | ICD-10-CM | POA: Diagnosis not present

## 2016-11-09 DIAGNOSIS — I517 Cardiomegaly: Secondary | ICD-10-CM | POA: Diagnosis present

## 2016-11-09 DIAGNOSIS — D72829 Elevated white blood cell count, unspecified: Secondary | ICD-10-CM | POA: Diagnosis present

## 2016-11-09 DIAGNOSIS — D5701 Hb-SS disease with acute chest syndrome: Secondary | ICD-10-CM | POA: Diagnosis present

## 2016-11-09 DIAGNOSIS — J189 Pneumonia, unspecified organism: Secondary | ICD-10-CM | POA: Diagnosis not present

## 2016-11-09 DIAGNOSIS — L97919 Non-pressure chronic ulcer of unspecified part of right lower leg with unspecified severity: Secondary | ICD-10-CM | POA: Diagnosis not present

## 2016-11-09 DIAGNOSIS — L97909 Non-pressure chronic ulcer of unspecified part of unspecified lower leg with unspecified severity: Secondary | ICD-10-CM | POA: Diagnosis not present

## 2016-11-09 DIAGNOSIS — Z825 Family history of asthma and other chronic lower respiratory diseases: Secondary | ICD-10-CM | POA: Diagnosis not present

## 2016-11-09 DIAGNOSIS — D571 Sickle-cell disease 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: Secondary | ICD-10-CM | POA: Diagnosis present

## 2016-11-09 DIAGNOSIS — Z832 Family history of diseases of the blood and blood-forming organs and certain disorders involving the immune mechanism: Secondary | ICD-10-CM | POA: Diagnosis not present

## 2016-11-09 DIAGNOSIS — I872 Venous insufficiency (chronic) (peripheral): Secondary | ICD-10-CM | POA: Diagnosis present

## 2016-11-09 DIAGNOSIS — R6510 Systemic inflammatory response syndrome (SIRS) of non-infectious origin without acute organ dysfunction: Secondary | ICD-10-CM | POA: Diagnosis present

## 2016-11-09 LAB — COMPREHENSIVE METABOLIC PANEL
ALT: 21 U/L (ref 14–54)
ANION GAP: 7 (ref 5–15)
AST: 33 U/L (ref 15–41)
Albumin: 4.7 g/dL (ref 3.5–5.0)
Alkaline Phosphatase: 54 U/L (ref 38–126)
BUN: 9 mg/dL (ref 6–20)
CHLORIDE: 104 mmol/L (ref 101–111)
CO2: 27 mmol/L (ref 22–32)
Calcium: 9.2 mg/dL (ref 8.9–10.3)
Creatinine, Ser: 0.34 mg/dL — ABNORMAL LOW (ref 0.44–1.00)
Glucose, Bld: 109 mg/dL — ABNORMAL HIGH (ref 65–99)
POTASSIUM: 3.4 mmol/L — AB (ref 3.5–5.1)
Sodium: 138 mmol/L (ref 135–145)
Total Bilirubin: 6.4 mg/dL — ABNORMAL HIGH (ref 0.3–1.2)
Total Protein: 8.4 g/dL — ABNORMAL HIGH (ref 6.5–8.1)

## 2016-11-09 LAB — D-DIMER, QUANTITATIVE (NOT AT ARMC): D DIMER QUANT: 3.39 ug{FEU}/mL — AB (ref 0.00–0.50)

## 2016-11-09 LAB — PREPARE RBC (CROSSMATCH)

## 2016-11-09 LAB — I-STAT TROPONIN, ED: TROPONIN I, POC: 0.01 ng/mL (ref 0.00–0.08)

## 2016-11-09 LAB — I-STAT CG4 LACTIC ACID, ED: LACTIC ACID, VENOUS: 0.91 mmol/L (ref 0.5–1.9)

## 2016-11-09 MED ORDER — FOLIC ACID 1 MG PO TABS
1.0000 mg | ORAL_TABLET | Freq: Every day | ORAL | Status: DC
  Administered 2016-11-09 – 2016-11-14 (×6): 1 mg via ORAL
  Filled 2016-11-09 (×7): qty 1

## 2016-11-09 MED ORDER — LEVOFLOXACIN IN D5W 750 MG/150ML IV SOLN
750.0000 mg | Freq: Once | INTRAVENOUS | Status: AC
  Administered 2016-11-09: 750 mg via INTRAVENOUS
  Filled 2016-11-09: qty 150

## 2016-11-09 MED ORDER — L-GLUTAMINE ORAL POWDER
5.0000 g | PACK | Freq: Two times a day (BID) | ORAL | Status: DC
  Administered 2016-11-09 – 2016-11-14 (×11): 5 g via ORAL
  Filled 2016-11-09 (×11): qty 1

## 2016-11-09 MED ORDER — ACETAMINOPHEN 500 MG PO TABS
1000.0000 mg | ORAL_TABLET | Freq: Once | ORAL | Status: AC
  Administered 2016-11-09: 1000 mg via ORAL
  Filled 2016-11-09: qty 2

## 2016-11-09 MED ORDER — ENOXAPARIN SODIUM 40 MG/0.4ML ~~LOC~~ SOLN
40.0000 mg | SUBCUTANEOUS | Status: DC
  Administered 2016-11-09 – 2016-11-14 (×6): 40 mg via SUBCUTANEOUS
  Filled 2016-11-09 (×6): qty 0.4

## 2016-11-09 MED ORDER — SODIUM CHLORIDE 0.45 % IV SOLN
INTRAVENOUS | Status: DC
  Administered 2016-11-09 – 2016-11-10 (×3): via INTRAVENOUS
  Administered 2016-11-10: 125 mL/h via INTRAVENOUS
  Administered 2016-11-11 – 2016-11-12 (×3): via INTRAVENOUS

## 2016-11-09 MED ORDER — ONDANSETRON HCL 4 MG/2ML IJ SOLN: 4.0000 mg | Freq: Four times a day (QID) | INTRAMUSCULAR | Status: DC | PRN

## 2016-11-09 MED ORDER — DIPHENHYDRAMINE HCL 50 MG/ML IJ SOLN
25.0000 mg | INTRAMUSCULAR | Status: DC | PRN
  Filled 2016-11-09: qty 0.5

## 2016-11-09 MED ORDER — NALOXONE HCL 0.4 MG/ML IJ SOLN: 0.4000 mg | INTRAMUSCULAR | Status: DC | PRN

## 2016-11-09 MED ORDER — SODIUM CHLORIDE 0.9% FLUSH: 9.0000 mL | INTRAVENOUS | Status: DC | PRN

## 2016-11-09 MED ORDER — CEFTRIAXONE SODIUM 1 G IJ SOLR
1.0000 g | INTRAMUSCULAR | Status: DC
Start: 2016-11-09 — End: 2016-11-12
  Administered 2016-11-09 – 2016-11-11 (×3): 1 g via INTRAVENOUS
  Filled 2016-11-09 (×4): qty 10

## 2016-11-09 MED ORDER — POLYETHYLENE GLYCOL 3350 17 G PO PACK
17.0000 g | PACK | Freq: Every day | ORAL | Status: DC | PRN
  Filled 2016-11-09: qty 1

## 2016-11-09 MED ORDER — SENNOSIDES-DOCUSATE SODIUM 8.6-50 MG PO TABS
1.0000 | ORAL_TABLET | Freq: Two times a day (BID) | ORAL | Status: DC
  Administered 2016-11-09 – 2016-11-14 (×10): 1 via ORAL
  Filled 2016-11-09 (×11): qty 1

## 2016-11-09 MED ORDER — DEXTROSE 5 % IV SOLN
500.0000 mg | INTRAVENOUS | Status: DC
  Administered 2016-11-09 – 2016-11-11 (×3): 500 mg via INTRAVENOUS
  Filled 2016-11-09 (×4): qty 500

## 2016-11-09 MED ORDER — DIPHENHYDRAMINE HCL 25 MG PO CAPS: 25.0000 mg | ORAL_CAPSULE | ORAL | Status: DC | PRN

## 2016-11-09 MED ORDER — SODIUM CHLORIDE 0.9 % IV SOLN: Freq: Once | INTRAVENOUS | Status: DC

## 2016-11-09 MED ORDER — HYDROMORPHONE 1 MG/ML IV SOLN
INTRAVENOUS | Status: DC
  Administered 2016-11-09: 25 mg via INTRAVENOUS
  Administered 2016-11-09: 3.5 mg via INTRAVENOUS
  Administered 2016-11-09: 6 mg via INTRAVENOUS
  Administered 2016-11-09: 25 mg via INTRAVENOUS
  Administered 2016-11-09: 1.39 mg via INTRAVENOUS
  Administered 2016-11-09: 6.5 mg via INTRAVENOUS
  Administered 2016-11-10: 25 mg via INTRAVENOUS
  Administered 2016-11-10: 8.5 mg via INTRAVENOUS
  Administered 2016-11-10: 5 mg via INTRAVENOUS
  Administered 2016-11-10: 2.5 mg via INTRAVENOUS
  Administered 2016-11-10: 5 mg via INTRAVENOUS
  Administered 2016-11-10: 6.5 mg via INTRAVENOUS
  Administered 2016-11-11: 4.5 mg via INTRAVENOUS
  Administered 2016-11-11: 2.5 mg via INTRAVENOUS
  Administered 2016-11-11: 7 mg via INTRAVENOUS
  Filled 2016-11-09 (×3): qty 25

## 2016-11-09 NOTE — H&P (Signed)
History and Physical  Patient Name: [name]     WSF:[id number]    DOB: [birth year]    DOA: 11/08/2016 PCP: [name], FNP   Patient coming from: Home  Chief Complaint: Pain crisis  HPI: [name] is a 46 y.o. female with a past medical history significant for sickle cell disease and chronic leg wound and recurrent Acute chest syndrome who presents with complaints of sickle cell pain.  The patient was in her usual state of health until about 24 hours ago in the morning when she woke up with worsening chest pain and thigh pain. She tried taking her home ibuprofen and oxycodone without relief and then came to the emergency room where she was given IV Dilaudid and felt some improvement in left. However at home her pain continued, the pain in her chest worsened and so she came back to the emergency room. She has no fever, cough, sputum, dyspnea.  Her chest hurts a lot, hard to say if worse with inspiration.  She has had acute chest in the past, no history of DVT/PE. She has mild leg swelling.  ED course: -Afebrile, heart rate 92, respirations 18-30, blood pressure 120/73, pulse oximetry 92-94% on room air -Na 138, K 3.7, Cr 0.34, WBC 25.9K, Hgb 7.1 (baseline 5-7) -Positive pregnancy test -D-dimer elevated -Blood cultures were obtained -Lactic acid 0.91 -CXR showed a new lobar infiltrate since this morning, consistent with acute chest syndrome -ECG showed nonspecific TW changes -She was given IV fluids and hydromorphone and TRH were asked to evaluate for chest pain     ROS: Review of Systems  Constitutional: Negative for chills and fever.  Respiratory: Negative for cough, sputum production, shortness of breath and wheezing.   Cardiovascular: Positive for chest pain and leg swelling.  All other systems reviewed and are negative.         Past Medical History:  Diagnosis Date  . Anemia 03/30/2012   Hx of sickle cell disease  . Anemia complicating pregnancy 0/17/4944    . CAP (community acquired pneumonia)    2014  . Cholelithiasis y-1  . Cholelithiasis   . Elevated LFTs   . Leg ulcer ([place]) 10/27/2012   Chronic under care of wound clinic  . Multiple open wounds of lower extremity    chronic wounds B/LLE  . Sickle cell disease ([place])     Past Surgical History:  Procedure Laterality Date  . ALLOGRAFT APPLICATION Right 9/67/5916   Procedure: SURGICAL PREP FOR GRAFTING RIGHT LOWER EXTREMITY AND APPLICATION OF Jannifer Hick;  Surgeon: Irene Limbo, MD;  Location: [place];  Service: Plastics;  Laterality: Right;  . APPLICATION OF A-CELL OF EXTREMITY Right 10/09/2015   Procedure: APPLICATION OF Jannifer Hick;  Surgeon: Irene Limbo, MD;  Location: [place];  Service: Plastics;  Laterality: Right;  . BREAST SURGERY     left breast cyst aspiration  . CESAREAN SECTION    . CESAREAN SECTION N/A 01/06/2014   Procedure: CESAREAN SECTION;  Surgeon: Emily Filbert, MD;  Location: [place] [facility];  Service: Obstetrics;  Laterality: N/A;  . I&D EXTREMITY Right 10/09/2015   Procedure: SURGICAL PREPARATION FOR GRAFTING RIGHT ANKLE AND APPLICATION THERASKIN;  Surgeon: Irene Limbo, MD;  Location: [place];  Service: Plastics;  Laterality: Right;  . SKIN FULL THICKNESS GRAFT Bilateral 06/17/2016   Procedure: SURGICAL PREP FOR GRAFTING, BILATERAL LOWER EXTREMITIES AND APPLICATION OF Jannifer Hick;  Surgeon: Irene Limbo, MD;  Location: [place];  Service: Plastics;  Laterality:  Bilateral;  . TONSILLECTOMY      Social History: Patient lives with her husband and children.  The patient walks unassisted.  She is not a smoker.    No Known Allergies  Family history: family history includes Asthma in her mother; Diabetes in her mother; Hypertension in her father; Sickle cell anemia in her sister; Sickle cell trait in her father and mother.  Prior to Admission medications   Medication Sig Start Date End Date Taking? Authorizing Provider   Cholecalciferol (VITAMIN D) 2000 units CAPS Take 2 capsules (4,000 Units total) by mouth daily. 06/03/16  Yes [name], FNP  collagenase (SANTYL) ointment Apply 1 application topically daily. Pt applies to right foot.   Yes [provider]  folic acid (FOLVITE) 1 MG tablet Take 1 tablet (1 mg total) by mouth daily. 03/21/16  Yes [name], NP  L-glutamine (ENDARI) 5 g PACK Powder Packet Take 5 g by mouth 2 (two) times daily. 08/29/16  Yes [name], FNP  Oxycodone HCl 10 MG TABS Take 1 tablet (10 mg total) by mouth every 4 (four) hours as needed (pain). 11/01/16  Yes [name], FNP       Physical Exam: BP 125/81 (BP Location: Right Arm)   Pulse 88   Temp 98.5 F (36.9 C) (Oral)   Resp 20   Ht 5\' 7"  (1.702 m)   Wt 59.4 kg (130 lb 15.3 oz)   LMP 10/10/2016 Comment: patient double shielded  SpO2 100%   BMI 20.51 kg/m  General appearance: Well-developed, adult female, alert and in moderate distress from pain.   Eyes: Icteric, conjunctiva pink, lids and lashes normal. PERRL.    ENT: No nasal deformity, discharge, epistaxis.  Hearing normal. OP moist without lesions.   Neck: No neck masses.  Trachea midline.  No thyromegaly/tenderness. Lymph: No cervical or supraclavicular lymphadenopathy. Skin: Warm and dry.  No jaundice.  No suspicious rashes or lesions. Cardiac: RRR, nl S1-S2, no murmurs appreciated.  Capillary refill is brisk.  No LE edema.  Radial and DP pulses 2+ and symmetric. Respiratory: Normal respiratory rate and rhythm.  CTAB without rales or wheezes. Abdomen: Abdomen soft.  No TTP. No ascites, distension, hepatosplenomegaly.   MSK: No deformities or effusions.  No cyanosis or clubbing. Neuro: Cranial nerves grossly normal.  Sensation intact to light touch. Speech is fluent.  Muscle strength normal.    Psych: Sensorium intact and responding to questions, attention normal.  Behavior appropriate.  Affect blunted by pain.  Judgment and  insight appear normal.     Labs on Admission:  I have personally reviewed following labs and imaging studies: CBC:  Recent Labs Lab 11/08/16 1330  WBC 25.9*  NEUTROABS 19.7*  HGB 7.1*  HCT 20.1*  MCV 88.9  PLT 751*   Basic Metabolic Panel:  Recent Labs Lab 11/08/16 1152 11/08/16 2259  NA 138 138  K 3.7 3.4*  CL 105 104  CO2 24 27  GLUCOSE 97 109*  BUN 7 9  CREATININE 0.31* 0.34*  CALCIUM 9.1 9.2   GFR: Estimated Creatinine Clearance: 83.3 mL/min (A) (by C-G formula based on SCr of 0.34 mg/dL (L)).  Liver Function Tests:  Recent Labs Lab 11/08/16 1152 11/08/16 2259  AST 34 33  ALT 23 21  ALKPHOS 49 54  BILITOT 4.3* 6.4*  PROT 7.9 8.4*  ALBUMIN 4.5 4.7   No results for input(s): LIPASE, AMYLASE in the last 168 hours. No results for input(s): AMMONIA in the  last 168 hours. Coagulation Profile: No results for input(s): INR, PROTIME in the last 168 hours. Cardiac Enzymes: No results for input(s): CKTOTAL, CKMB, CKMBINDEX, TROPONINI in the last 168 hours. BNP (last 3 results) No results for input(s): PROBNP in the last 8760 hours. HbA1C: No results for input(s): HGBA1C in the last 72 hours. CBG: No results for input(s): GLUCAP in the last 168 hours. Lipid Profile: No results for input(s): CHOL, HDL, LDLCALC, TRIG, CHOLHDL, LDLDIRECT in the last 72 hours. Thyroid Function Tests: No results for input(s): TSH, T4TOTAL, FREET4, T3FREE, THYROIDAB in the last 72 hours. Anemia Panel:  Recent Labs  11/08/16 1330  RETICCTPCT >23.0*   Sepsis Labs: Lactic acid 0.91 Invalid input(s): PROCALCITONIN, LACTICIDVEN No results found for this or any previous visit (from the past 240 hour(s)).       Radiological Exams on Admission: Personally reviewed CXR shows new lobar infilrate: Dg Chest 2 View  Result Date: 11/09/2016 CLINICAL DATA:  Acute onset of cough and generalized chest pain. Initial encounter. EXAM: CHEST  2 VIEW COMPARISON:  Chest radiograph  performed 11/08/2016 FINDINGS: Right midlung opacity may reflect mild acute chest syndrome. Left basilar airspace opacity likely reflects atelectasis. The lungs are hypoexpanded. Mild vascular congestion is noted. The heart is mildly enlarged. No acute osseous abnormalities are identified. IMPRESSION: 1. Right midlung airspace opacity may reflect mild acute chest syndrome. 2. Mild left basilar opacity likely reflects atelectasis. Lungs hypoexpanded. 3. Mild vascular congestion and mild cardiomegaly noted. Electronically Signed   By: Garald Balding M.D.   On: 11/09/2016 01:17   Dg Chest 2 View  Result Date: 11/08/2016 CLINICAL DATA:  Chest pain EXAM: CHEST  2 VIEW COMPARISON:  09/09/2016 FINDINGS: Heart is enlarged. Bibasilar atelectasis. No effusions or edema. No acute bony abnormality. IMPRESSION: Cardiomegaly.  Bibasilar atelectasis. Electronically Signed   By: Rolm Baptise M.D.   On: 11/08/2016 11:35    EKG: Independently reviewed. Rate 86, QTc 473, nonspecific T wave changes.    Assessment/Plan Principal Problem:   Hb-SS disease with acute chest syndrome (HCC) Active Problems:   Sickle cell anemia (HCC)    1. Vaso-occlussive sickle cell pain crisis with acute chest syndrome: Will admit per sickle cell protocol for pain control. -Weight based high-dose IV hydromorphone PCA with continuous ETCO2 monitoring -IV Ketorolac contraindicated in pregnancy -Hydrate with IVF D5 .45% saline @ 100 cc/hr  -Diphenhydramine, ondansetron and bowel regimen    2. Sickle cell anemia:  Baseline Hgb 7 -Transfuse 1 unit now given pregnancy, goal Hgb >8 g/dL  3. Elevated d-dimer:  Acute chest syndrome is suspected over venous thromboembolism with source legs, given opacity on CXR. -Follow up LE doppler ordered in ER  4. Pregnancy:  This is new       DVT prophylaxis: Lovenox  Code Status: FULL  Family Communication: None present  Disposition Plan: Initiate IV hydromorphone PCA now, Sickle  cell team to take over management in AM. Consults called: None Admission status: INPATIENT, med surg         Medical decision making: Patient seen at 3:15 AM on 11/09/2016.  The patient was discussed with Dr. Kathrynn Humble.  What exists of the patient's chart was reviewed in depth and summarized above.  Clinical condition: currently hemodynamically stable with continuous respiratory monitoring.        Edwin Dada [facility] Pager [phone]

## 2016-11-09 NOTE — Progress Notes (Signed)
About to administer blood; but pt has a temp of 100.3. Dr. Doreene Burke made aware. One time order given for tylenol and to monitor patient until decrease in temp before adminstering blood transfusion. [name].

## 2016-11-09 NOTE — Progress Notes (Signed)
Patient ID: [name], female   DOB: March 12, 1971, 46 y.o.   MRN: [id number] Subjective:  [name] is a 46 y.o. female with a medical history significant for sickle cell disease and chronic leg wound and recurrent Acute chest syndrome who presents with complaints of sickle cell pain. Findings revealed possible ACS, early gestation (patient found out for the first time). Patient was admitted for ACS and acute sickle cell pain crisis. She continues to have significant pain especially in her chest and back. She is nauseous but no vomiting. She has no fever, no cough, breathing is fast but no shortness of breath.  Objective:  Vital signs in last 24 hours:  Vitals:   11/09/16 0034 11/09/16 0119 11/09/16 0155 11/09/16 0343  BP: (!) 143/86 113/81 128/80 125/81  Pulse: 81 75 80 88  Resp: 19 19 18 20   Temp:    98.5 F (36.9 C)  TempSrc:    Oral  SpO2: 99% 97% 97% 100%  Weight:    59.4 kg (130 lb 15.3 oz)  Height:    5\' 7"  (1.702 m)   Intake/Output from previous day:   Intake/Output Summary (Last 24 hours) at 11/09/16 1028 Last data filed at 11/09/16 1017  Gross per 24 hour  Intake           297.92 ml  Output                0 ml  Net           297.92 ml    Physical Exam: General: Alert, awake, oriented x3, in no acute distress.  HEENT: [place]/AT PEERL, EOMI Neck: Trachea midline,  no masses, no thyromegal,y no JVD, no carotid bruit OROPHARYNX:  Moist, No exudate/ erythema/lesions.  Heart: Regular rate and rhythm, without murmurs, rubs, gallops, PMI non-displaced, no heaves or thrills on palpation.  Lungs: Clear to auscultation, no wheezing or rhonchi noted. No increased vocal fremitus resonant to percussion  Abdomen: Soft, nontender, nondistended, positive bowel sounds, no masses no hepatosplenomegaly noted..  Neuro: No focal neurological deficits noted cranial nerves II through XII grossly intact. DTRs 2+ bilaterally upper and lower extremities. Strength 5 out of 5 in bilateral  upper and lower extremities. Musculoskeletal: Leg ulcer dressing is clean and dry. No evidence of acute infection. No warm swelling or erythema around joints, no spinal tenderness noted. Psychiatric: Patient alert and oriented x3, good insight and cognition, good recent to remote recall. Lymph node survey: No cervical axillary or inguinal lymphadenopathy noted.  Lab Results:  Basic Metabolic Panel:    Component Value Date/Time   NA 138 11/08/2016 2259   NA 139 01/20/2014 0758   K 3.4 (L) 11/08/2016 2259   K 3.9 01/20/2014 0758   CL 104 11/08/2016 2259   CO2 27 11/08/2016 2259   CO2 23 01/20/2014 0758   BUN 9 11/08/2016 2259   BUN 6.0 (L) 01/20/2014 0758   CREATININE 0.34 (L) 11/08/2016 2259   CREATININE 0.34 (L) 08/29/2016 1055   CREATININE 0.5 (L) 01/20/2014 0758   GLUCOSE 109 (H) 11/08/2016 2259   GLUCOSE 72 01/20/2014 0758   CALCIUM 9.2 11/08/2016 2259   CALCIUM 9.0 01/20/2014 0758   CBC:    Component Value Date/Time   WBC 25.9 (H) 11/08/2016 1330   HGB 7.1 (L) 11/08/2016 1330   HGB 8.7 (L) 01/20/2014 0757   HCT 20.1 (L) 11/08/2016 1330   HCT 25.2 (L) 01/20/2014 0757   PLT 440 (H) 11/08/2016 1330   PLT 611 (H)  01/20/2014 0757   MCV 88.9 11/08/2016 1330   MCV 84.3 01/20/2014 0757   NEUTROABS 19.7 (H) 11/08/2016 1330   NEUTROABS 8.7 (H) 01/20/2014 0757   LYMPHSABS 2.8 11/08/2016 1330   LYMPHSABS 2.7 01/20/2014 0757   MONOABS 3.4 (H) 11/08/2016 1330   MONOABS 1.9 (H) 01/20/2014 0757   EOSABS 0.0 11/08/2016 1330   EOSABS 0.5 01/20/2014 0757   BASOSABS 0.0 11/08/2016 1330   BASOSABS 0.2 (H) 01/20/2014 0757    No results found for this or any previous visit (from the past 240 hour(s)).  Studies/Results: Dg Chest 2 View  Result Date: 11/09/2016 CLINICAL DATA:  Acute onset of cough and generalized chest pain. Initial encounter. EXAM: CHEST  2 VIEW COMPARISON:  Chest radiograph performed 11/08/2016 FINDINGS: Right midlung opacity may reflect mild acute chest  syndrome. Left basilar airspace opacity likely reflects atelectasis. The lungs are hypoexpanded. Mild vascular congestion is noted. The heart is mildly enlarged. No acute osseous abnormalities are identified. IMPRESSION: 1. Right midlung airspace opacity may reflect mild acute chest syndrome. 2. Mild left basilar opacity likely reflects atelectasis. Lungs hypoexpanded. 3. Mild vascular congestion and mild cardiomegaly noted. Electronically Signed   By: Garald Balding M.D.   On: 11/09/2016 01:17   Dg Chest 2 View  Result Date: 11/08/2016 CLINICAL DATA:  Chest pain EXAM: CHEST  2 VIEW COMPARISON:  09/09/2016 FINDINGS: Heart is enlarged. Bibasilar atelectasis. No effusions or edema. No acute bony abnormality. IMPRESSION: Cardiomegaly.  Bibasilar atelectasis. Electronically Signed   By: Rolm Baptise M.D.   On: 11/08/2016 11:35    Medications: Scheduled Meds: . enoxaparin (LOVENOX) injection  40 mg Subcutaneous Q24H  . folic acid  1 mg Oral Daily  . HYDROmorphone   Intravenous Q4H  . L-glutamine  5 g Oral BID  . senna-docusate  1 tablet Oral BID   Continuous Infusions: . sodium chloride 125 mL/hr at 11/09/16 0403  . sodium chloride    . azithromycin    . cefTRIAXone (ROCEPHIN)  IV    . diphenhydrAMINE (BENADRYL) IVPB(SICKLE CELL ONLY)     PRN Meds:.diphenhydrAMINE **OR** diphenhydrAMINE (BENADRYL) IVPB(SICKLE CELL ONLY), naloxone **AND** sodium chloride flush, ondansetron (ZOFRAN) IV, polyethylene glycol  Consultants:  None  Procedures:  None  Antibiotics:  IV Ceftriaxone and Azithromycin Day 1  Assessment/Plan: Principal Problem:   Hb-SS disease with acute chest syndrome (HCC) Active Problems:   Sickle cell anemia (HCC)  1. Vaso-occlussive sickle cell pain crisis with acute chest syndrome: Chest X-Ray showed right mid lung airspace opacity which may reflect mild acute chest syndrome, mild left basilar opacity likely atelectasis, mild vascular congestion and mild cardiomegaly.  Patient will be transfused one unit of packed red blood cell. Will continue antibiotics, IVF and other adjunct therapies. NO Toradol in view of early pregnancy 2. Sickle Cell Anemia: Hemoglobin is low, and on account of possible ACS and early pregnancy, agree with PRBC transfusion.  3. Early Gestation: Patient has been informed of Positive Preg Test. Patient showed indifference but would like to know what's next. She has been educated and counseled especially on early antenatal care and registration with OBGYN 4. Chronic pain: Restart home medications  Code Status: Full Code Family Communication: N/A Disposition Plan: Not yet ready for discharge  Cipriano Millikan  If 7PM-7AM, please contact night-coverage.  11/09/2016, 10:28 AM  LOS: 0 days

## 2016-11-09 NOTE — ED Notes (Signed)
Admitting physician at bedside

## 2016-11-10 ENCOUNTER — Inpatient Hospital Stay (HOSPITAL_COMMUNITY): Payer: Medicaid Other

## 2016-11-10 DIAGNOSIS — M79609 Pain in unspecified limb: Secondary | ICD-10-CM

## 2016-11-10 LAB — COMPREHENSIVE METABOLIC PANEL
ALT: 24 U/L (ref 14–54)
ANION GAP: 8 (ref 5–15)
AST: 29 U/L (ref 15–41)
Albumin: 3.7 g/dL (ref 3.5–5.0)
Alkaline Phosphatase: 54 U/L (ref 38–126)
BUN: 6 mg/dL (ref 6–20)
CALCIUM: 8.4 mg/dL — AB (ref 8.9–10.3)
CHLORIDE: 99 mmol/L — AB (ref 101–111)
CO2: 25 mmol/L (ref 22–32)
Creatinine, Ser: <0.3 mg/dL — ABNORMAL LOW (ref 0.44–1.00)
Glucose, Bld: 130 mg/dL — ABNORMAL HIGH (ref 65–99)
Potassium: 3.4 mmol/L — ABNORMAL LOW (ref 3.5–5.1)
SODIUM: 132 mmol/L — AB (ref 135–145)
Total Bilirubin: 9.5 mg/dL — ABNORMAL HIGH (ref 0.3–1.2)
Total Protein: 7 g/dL (ref 6.5–8.1)

## 2016-11-10 LAB — CBC WITH DIFFERENTIAL/PLATELET
BASOS ABS: 0 10*3/uL (ref 0.0–0.1)
Basophils Relative: 0 %
Eosinophils Absolute: 0.3 10*3/uL (ref 0.0–0.7)
Eosinophils Relative: 1 %
HEMATOCRIT: 20.7 % — AB (ref 36.0–46.0)
Hemoglobin: 7.3 g/dL — ABNORMAL LOW (ref 12.0–15.0)
LYMPHS ABS: 1.1 10*3/uL (ref 0.7–4.0)
LYMPHS PCT: 4 %
MCH: 31.2 pg (ref 26.0–34.0)
MCHC: 35.3 g/dL (ref 30.0–36.0)
MCV: 88.5 fL (ref 78.0–100.0)
Monocytes Absolute: 2.9 10*3/uL — ABNORMAL HIGH (ref 0.1–1.0)
Monocytes Relative: 11 %
NEUTROS PCT: 84 %
Neutro Abs: 22.2 10*3/uL — ABNORMAL HIGH (ref 1.7–7.7)
PLATELETS: 425 10*3/uL — AB (ref 150–400)
RBC: 2.34 MIL/uL — AB (ref 3.87–5.11)
RDW: 19.1 % — AB (ref 11.5–15.5)
WBC: 26.5 10*3/uL — AB (ref 4.0–10.5)
nRBC: 16 /100 WBC — ABNORMAL HIGH

## 2016-11-10 LAB — RETICULOCYTES: RBC.: 2.34 MIL/uL — AB (ref 3.87–5.11)

## 2016-11-10 MED ORDER — POLYETHYLENE GLYCOL 3350 17 G PO PACK
17.0000 g | PACK | Freq: Every day | ORAL | Status: DC
  Administered 2016-11-10 – 2016-11-14 (×5): 17 g via ORAL
  Filled 2016-11-10 (×4): qty 1

## 2016-11-10 MED ORDER — OXYCODONE HCL 5 MG PO TABS: 10.0000 mg | ORAL_TABLET | ORAL | Status: DC | PRN

## 2016-11-10 MED ORDER — SODIUM CHLORIDE 0.9 % IV SOLN
Freq: Once | INTRAVENOUS | Status: AC
  Administered 2016-11-10: 13:00:00 via INTRAVENOUS

## 2016-11-10 MED ORDER — HYDROMORPHONE HCL 1 MG/ML IJ SOLN
2.0000 mg | INTRAMUSCULAR | Status: AC | PRN
  Administered 2016-11-10 – 2016-11-11 (×3): 2 mg via INTRAVENOUS
  Filled 2016-11-10 (×3): qty 2

## 2016-11-10 NOTE — Progress Notes (Signed)
Patient ID: [name], female   DOB: February 02, 1971, 46 y.o.   MRN: [id number] Subjective:  Patient reports no significant improvement in her pain level and general conditions . She is also constipated. She denies fever, denies SOB.   Objective:  Vital signs in last 24 hours:  Vitals:   11/10/16 0422 11/10/16 0549 11/10/16 0611 11/10/16 1055  BP:  123/61  122/66  Pulse:  (!) 108  (!) 110  Resp: 20 20 20  (!) 38  Temp:  100.1 F (37.8 C)  98.7 F (37.1 C)  TempSrc:  Oral  Oral  SpO2: 98% 98% 96% 96%  Weight:      Height:       Intake/Output from previous day:   Intake/Output Summary (Last 24 hours) at 11/10/16 1217 Last data filed at 11/10/16 1022  Gross per 24 hour  Intake          2923.33 ml  Output             2860 ml  Net            63.33 ml    Physical Exam: General: Alert, awake, oriented x3, in no acute distress.  HEENT: [place]/AT PEERL, EOMI Neck: Trachea midline,  no masses, no thyromegal,y no JVD, no carotid bruit OROPHARYNX:  Moist, No exudate/ erythema/lesions.  Heart: Regular rate and rhythm, without murmurs, rubs, gallops, PMI non-displaced, no heaves or thrills on palpation.  Lungs: Clear to auscultation, no wheezing or rhonchi noted. No increased vocal fremitus resonant to percussion  Abdomen: Soft, nontender, nondistended, positive bowel sounds, no masses no hepatosplenomegaly noted..  Neuro: No focal neurological deficits noted cranial nerves II through XII grossly intact. DTRs 2+ bilaterally upper and lower extremities. Strength 5 out of 5 in bilateral upper and lower extremities. Musculoskeletal: No warm swelling or erythema around joints, no spinal tenderness noted. Psychiatric: Patient alert and oriented x3, good insight and cognition, good recent to remote recall. Lymph node survey: No cervical axillary or inguinal lymphadenopathy noted.  Lab Results:  Basic Metabolic Panel:    Component Value Date/Time   NA 132 (L) 11/10/2016 0534   NA 139  01/20/2014 0758   K 3.4 (L) 11/10/2016 0534   K 3.9 01/20/2014 0758   CL 99 (L) 11/10/2016 0534   CO2 25 11/10/2016 0534   CO2 23 01/20/2014 0758   BUN 6 11/10/2016 0534   BUN 6.0 (L) 01/20/2014 0758   CREATININE <0.30 (L) 11/10/2016 0534   CREATININE 0.34 (L) 08/29/2016 1055   CREATININE 0.5 (L) 01/20/2014 0758   GLUCOSE 130 (H) 11/10/2016 0534   GLUCOSE 72 01/20/2014 0758   CALCIUM 8.4 (L) 11/10/2016 0534   CALCIUM 9.0 01/20/2014 0758   CBC:    Component Value Date/Time   WBC 26.5 (H) 11/10/2016 0534   HGB 7.3 (L) 11/10/2016 0534   HGB 8.7 (L) 01/20/2014 0757   HCT 20.7 (L) 11/10/2016 0534   HCT 25.2 (L) 01/20/2014 0757   PLT 425 (H) 11/10/2016 0534   PLT 611 (H) 01/20/2014 0757   MCV 88.5 11/10/2016 0534   MCV 84.3 01/20/2014 0757   NEUTROABS 22.2 (H) 11/10/2016 0534   NEUTROABS 8.7 (H) 01/20/2014 0757   LYMPHSABS 1.1 11/10/2016 0534   LYMPHSABS 2.7 01/20/2014 0757   MONOABS 2.9 (H) 11/10/2016 0534   MONOABS 1.9 (H) 01/20/2014 0757   EOSABS 0.3 11/10/2016 0534   EOSABS 0.5 01/20/2014 0757   BASOSABS 0.0 11/10/2016 0534   BASOSABS 0.2 (H) 01/20/2014 0757  Recent Results (from the past 240 hour(s))  Blood culture (routine x 2)     Status: None (Preliminary result)   Collection Time: 11/09/16  2:20 AM  Result Value Ref Range Status   Specimen Description BLOOD RIGHT ARM  Final   Special Requests   Final    BOTTLES DRAWN AEROBIC AND ANAEROBIC Blood Culture adequate volume   Culture   Final    NO GROWTH 1 DAY Performed at [facility], [address], [place], [place] [address]    Report Status PENDING  Incomplete  Blood culture (routine x 2)     Status: None (Preliminary result)   Collection Time: 11/09/16  2:20 AM  Result Value Ref Range Status   Specimen Description BLOOD LEFT ANTECUBITAL  Final   Special Requests   Final    BOTTLES DRAWN AEROBIC AND ANAEROBIC Blood Culture adequate volume   Culture   Final    NO GROWTH 1 DAY Performed at [facility], [place] [address] [place], [place] [address]    Report Status PENDING  Incomplete    Studies/Results: Dg Chest 2 View  Result Date: 11/09/2016 CLINICAL DATA:  Acute onset of cough and generalized chest pain. Initial encounter. EXAM: CHEST  2 VIEW COMPARISON:  Chest radiograph performed 11/08/2016 FINDINGS: Right midlung opacity may reflect mild acute chest syndrome. Left basilar airspace opacity likely reflects atelectasis. The lungs are hypoexpanded. Mild vascular congestion is noted. The heart is mildly enlarged. No acute osseous abnormalities are identified. IMPRESSION: 1. Right midlung airspace opacity may reflect mild acute chest syndrome. 2. Mild left basilar opacity likely reflects atelectasis. Lungs hypoexpanded. 3. Mild vascular congestion and mild cardiomegaly noted. Electronically Signed   By: Garald Balding M.D.   On: 11/09/2016 01:17    Medications: Scheduled Meds: . enoxaparin (LOVENOX) injection  40 mg Subcutaneous Q24H  . folic acid  1 mg Oral Daily  . HYDROmorphone   Intravenous Q4H  . L-glutamine  5 g Oral BID  . senna-docusate  1 tablet Oral BID   Continuous Infusions: . sodium chloride 125 mL/hr (11/10/16 0610)  . sodium chloride    . azithromycin Stopped (11/10/16 0003)  . cefTRIAXone (ROCEPHIN)  IV Stopped (11/10/16 0003)  . diphenhydrAMINE (BENADRYL) IVPB(SICKLE CELL ONLY)     PRN Meds:.diphenhydrAMINE **OR** diphenhydrAMINE (BENADRYL) IVPB(SICKLE CELL ONLY), HYDROmorphone (DILAUDID) injection, naloxone **AND** sodium chloride flush, ondansetron (ZOFRAN) IV, polyethylene glycol  Consultants:  None  Procedures:  None  Antibiotics:  Ceftriaxone and Azithromycin day 2  Assessment/Plan: Principal Problem:   Acute chest syndrome (HCC) Active Problems:   Sickle cell anemia (HCC)  1. Vaso-occlussive sickle cell pain crisis with acute chest syndrome: Patient reports no significant improvement, pain is still in her chest, but no SOB, no cough,  no fever. Patient will be transfused one more unit of packed red blood cell as there is no significant change in her Hb level from yesterday and WBCC is higher. Will continue antibiotics, IVF and other adjunct therapies. Will add IV Dilaudid 2 mg clinician assited doses Q 2 H x 3 doses. NO Toradol in view of early pregnancy. 2. Sickle Cell Anemia: Hemoglobin is low, and on account of possible ACS and early pregnancy, one more PRBC transfusion today.  3. Early Gestation: She has been educated and counseled especially on early antenatal care and registration with OBGYN 4. Chronic pain: Restart home medications.  5. Leg ulcers: Patient has ulcers on the medial and lateral aspect of RLE. No  evidence of infection. Continue Santyl dressing with NS moist gauze cover  Code Status: Full Code Family Communication: N/A Disposition Plan: Not yet ready for discharge  Lonia Roane  If 7PM-7AM, please contact night-coverage.  11/10/2016, 12:17 PM  LOS: 1 day

## 2016-11-10 NOTE — Progress Notes (Signed)
Pt medicated x1 today for breakthrough pain.

## 2016-11-10 NOTE — Progress Notes (Signed)
At home pt has orde to treat RLE chronic wound by washing with soap and water then applying 1% silver Sulfadiazine, cover with telfa then ACE wrap QD.  Do you want to give an order for same?

## 2016-11-10 NOTE — Progress Notes (Signed)
VASCULAR LAB PRELIMINARY  PRELIMINARY  PRELIMINARY  PRELIMINARY  Right lower extremity venous duplex completed.    Preliminary report:  Right:  No evidence of DVT, superficial thrombosis, or Baker's cyst.  Ashton Sabine, RVS 11/10/2016, 11:57 AM

## 2016-11-11 DIAGNOSIS — G894 Chronic pain syndrome: Secondary | ICD-10-CM

## 2016-11-11 DIAGNOSIS — D5701 Hb-SS disease with acute chest syndrome: Principal | ICD-10-CM

## 2016-11-11 DIAGNOSIS — D5700 Hb-SS disease with crisis, unspecified: Secondary | ICD-10-CM

## 2016-11-11 DIAGNOSIS — D638 Anemia in other chronic diseases classified elsewhere: Secondary | ICD-10-CM

## 2016-11-11 DIAGNOSIS — J189 Pneumonia, unspecified organism: Secondary | ICD-10-CM

## 2016-11-11 LAB — TYPE AND SCREEN
ABO/RH(D): B POS
Antibody Screen: NEGATIVE
UNIT DIVISION: 0
Unit division: 0

## 2016-11-11 LAB — PREPARE RBC (CROSSMATCH)

## 2016-11-11 LAB — BPAM RBC
BLOOD PRODUCT EXPIRATION DATE: 201807282359
Blood Product Expiration Date: 201807212359
ISSUE DATE / TIME: 201806231633
ISSUE DATE / TIME: 201806241340
UNIT TYPE AND RH: 5100
Unit Type and Rh: 5100

## 2016-11-11 MED ORDER — HYDROMORPHONE 1 MG/ML IV SOLN
INTRAVENOUS | Status: DC
  Administered 2016-11-11: 25 mg via INTRAVENOUS
  Administered 2016-11-11: 4.4 mg via INTRAVENOUS
  Administered 2016-11-12: 3.2 mg via INTRAVENOUS
  Administered 2016-11-12: 2 mg via INTRAVENOUS
  Administered 2016-11-12: 25 mg via INTRAVENOUS
  Administered 2016-11-12: 3.59 mg via INTRAVENOUS
  Administered 2016-11-12: 4 mg via INTRAVENOUS
  Administered 2016-11-12: 3.2 mg via INTRAVENOUS
  Administered 2016-11-12: 5 mg via INTRAVENOUS
  Administered 2016-11-12: 4.8 mg via INTRAVENOUS
  Administered 2016-11-13 (×2): 1.6 mg via INTRAVENOUS
  Administered 2016-11-13: 6.8 mg via INTRAVENOUS
  Administered 2016-11-13: 5.6 mg via INTRAVENOUS
  Administered 2016-11-14: 13 mg via INTRAVENOUS
  Administered 2016-11-14: 6.4 mg via INTRAVENOUS
  Administered 2016-11-14: 10 mg via INTRAVENOUS
  Administered 2016-11-14: 1.2 mg via INTRAVENOUS
  Filled 2016-11-11 (×3): qty 25

## 2016-11-11 NOTE — Progress Notes (Signed)
SICKLE CELL SERVICE PROGRESS NOTE  [name] YYQ:[id number] DOB: 11/30/1970 DOA: 11/08/2016 PCP: [name], FNP  Assessment/Plan: Principal Problem:   Acute chest syndrome [facility]) Active Problems:   Sickle cell anemia ([place])   1. Acute Chest Syndrome: Pt was assessed as having Acute Chest Syndrome by Dr. Doreene Burke and transfused 1 unit RBC's yesterday. No post transfusion Hb  2. SIRS: Secondary to sickle cell crisis and Acute Chest Syndrome. 3. Hb Ss with Crisis: Decrease PCA bolus to 0.4 mg as patient with elevated CO2 on PCA and patient falling asleep with this dose of Dilaudid . Pt unable to receive NSAID's as she is pregnant. 4. Early Stage Pregnancy: Avoid NSAID's and Hydrea.    Code Status: Full Code Family Communication: N/A Disposition Plan: Not yet ready for discharge  [place].  Pager [phone]. If 7PM-7AM, please contact night-coverage.  11/11/2016, 5:19 PM  LOS: 2 days   Interim History: Pt reports pain at 0/10 and localized to B/L ribs and sternum. She has used 26.6 mg of Dilaudid with 55/53:demnds/deliveries in the last 24 hours.   Consultants:  None  Procedures:  Transfusion 1 unit RBC's  Antibiotics:  Levaquin 6/23 >>6/23  Ceftriaxone 6/23 >>  Azithromycin 6/23 >>    Objective: Vitals:   11/11/16 0858 11/11/16 1033 11/11/16 1359 11/11/16 1430  BP:  104/63  130/70  Pulse:  97  99  Resp: (!) 29 (!) 31 (!) 28 (!) 22  Temp:  98.6 F (37 C)  98.4 F (36.9 C)  TempSrc:  Oral  Oral  SpO2: 98% 99% 99% 99%  Weight:      Height:       Weight change:   Intake/Output Summary (Last 24 hours) at 11/11/16 1719 Last data filed at 11/11/16 0918  Gross per 24 hour  Intake             1075 ml  Output             1800 ml  Net             -725 ml     Physical Exam General: Alert, awake, oriented x3, in no acute distress.  HEENT: [place]/AT PEERL, EOMI, mild icterus Neck: Trachea midline,  no masses, no thyromegal,y no JVD, no carotid  bruit OROPHARYNX:  Moist, No exudate/ erythema/lesions.  Heart: Regular rate and rhythm, without murmurs, rubs, gallops, PMI non-displaced, no heaves or thrills on palpation.  Lungs: Clear to auscultation, no wheezing or rhonchi noted. No increased vocal fremitus resonant to percussion.  Abdomen: Soft, nontender, nondistended, positive bowel sounds, no masses no hepatosplenomegaly noted  Neuro: No focal neurological deficits noted cranial nerves II through XII grossly intact. Strength at functional baseline in bilateral upper and lower extremities. Musculoskeletal: No warmth swelling or erythema around joints, no spinal tenderness noted. Psychiatric: Patient alert and oriented x3, good insight and cognition, good recent to remote recall.   Data Reviewed: Basic Metabolic Panel:  Recent Labs Lab 11/08/16 1152 11/08/16 2259 11/10/16 0534  NA 138 138 132*  K 3.7 3.4* 3.4*  CL 105 104 99*  CO2 24 27 25   GLUCOSE 97 109* 130*  BUN 7 9 6   CREATININE 0.31* 0.34* <0.30*  CALCIUM 9.1 9.2 8.4*   Liver Function Tests:  Recent Labs Lab 11/08/16 1152 11/08/16 2259 11/10/16 0534  AST 34 33 29  ALT 23 21 24   ALKPHOS 49 54 54  BILITOT 4.3* 6.4* 9.5*  PROT 7.9 8.4* 7.0  ALBUMIN 4.5 4.7  3.7   No results for input(s): LIPASE, AMYLASE in the last 168 hours. No results for input(s): AMMONIA in the last 168 hours. CBC:  Recent Labs Lab 11/08/16 1330 11/10/16 0534  WBC 25.9* 26.5*  NEUTROABS 19.7* 22.2*  HGB 7.1* 7.3*  HCT 20.1* 20.7*  MCV 88.9 88.5  PLT 440* 425*   Cardiac Enzymes: No results for input(s): CKTOTAL, CKMB, CKMBINDEX, TROPONINI in the last 168 hours. BNP (last 3 results)  Recent Labs  08/15/16 2007  BNP 48.3    ProBNP (last 3 results) No results for input(s): PROBNP in the last 8760 hours.  CBG: No results for input(s): GLUCAP in the last 168 hours.  Recent Results (from the past 240 hour(s))  Blood culture (routine x 2)     Status: None (Preliminary  result)   Collection Time: 11/09/16  2:20 AM  Result Value Ref Range Status   Specimen Description BLOOD RIGHT ARM  Final   Special Requests   Final    BOTTLES DRAWN AEROBIC AND ANAEROBIC Blood Culture adequate volume   Culture   Final    NO GROWTH 2 DAYS Performed at [facility], [address], [place], [place] [address]    Report Status PENDING  Incomplete  Blood culture (routine x 2)     Status: None (Preliminary result)   Collection Time: 11/09/16  2:20 AM  Result Value Ref Range Status   Specimen Description BLOOD LEFT ANTECUBITAL  Final   Special Requests   Final    BOTTLES DRAWN AEROBIC AND ANAEROBIC Blood Culture adequate volume   Culture   Final    NO GROWTH 2 DAYS Performed at [facility], [place] [address], [place], [place] [address]    Report Status PENDING  Incomplete     Studies: Dg Chest 2 View  Result Date: 11/09/2016 CLINICAL DATA:  Acute onset of cough and generalized chest pain. Initial encounter. EXAM: CHEST  2 VIEW COMPARISON:  Chest radiograph performed 11/08/2016 FINDINGS: Right midlung opacity may reflect mild acute chest syndrome. Left basilar airspace opacity likely reflects atelectasis. The lungs are hypoexpanded. Mild vascular congestion is noted. The heart is mildly enlarged. No acute osseous abnormalities are identified. IMPRESSION: 1. Right midlung airspace opacity may reflect mild acute chest syndrome. 2. Mild left basilar opacity likely reflects atelectasis. Lungs hypoexpanded. 3. Mild vascular congestion and mild cardiomegaly noted. Electronically Signed   By: Garald Balding M.D.   On: 11/09/2016 01:17   Dg Chest 2 View  Result Date: 11/08/2016 CLINICAL DATA:  Chest pain EXAM: CHEST  2 VIEW COMPARISON:  09/09/2016 FINDINGS: Heart is enlarged. Bibasilar atelectasis. No effusions or edema. No acute bony abnormality. IMPRESSION: Cardiomegaly.  Bibasilar atelectasis. Electronically Signed   By: Rolm Baptise M.D.   On: 11/08/2016 11:35     Scheduled Meds: . enoxaparin (LOVENOX) injection  40 mg Subcutaneous Q24H  . folic acid  1 mg Oral Daily  . HYDROmorphone   Intravenous Q4H  . L-glutamine  5 g Oral BID  . polyethylene glycol  17 g Oral Daily  . senna-docusate  1 tablet Oral BID   Continuous Infusions: . sodium chloride 125 mL/hr at 11/11/16 0941  . azithromycin Stopped (11/10/16 2233)  . cefTRIAXone (ROCEPHIN)  IV Stopped (11/10/16 2053)  . diphenhydrAMINE (BENADRYL) IVPB(SICKLE CELL ONLY)      Principal Problem:   Acute chest syndrome (HCC) Active Problems:   Sickle cell anemia (HCC)     In excess of 25 minutes spent  during this visit. Greater than 50% involved face to face contact with the patient for assessment, counseling and coordination of care.

## 2016-11-12 DIAGNOSIS — Z3490 Encounter for supervision of normal pregnancy, unspecified, unspecified trimester: Secondary | ICD-10-CM

## 2016-11-12 DIAGNOSIS — D72829 Elevated white blood cell count, unspecified: Secondary | ICD-10-CM

## 2016-11-12 DIAGNOSIS — R6510 Systemic inflammatory response syndrome (SIRS) of non-infectious origin without acute organ dysfunction: Secondary | ICD-10-CM

## 2016-11-12 LAB — CBC WITH DIFFERENTIAL/PLATELET
BASOS PCT: 0 %
Basophils Absolute: 0 10*3/uL (ref 0.0–0.1)
EOS ABS: 0.3 10*3/uL (ref 0.0–0.7)
EOS PCT: 1 %
HCT: 21 % — ABNORMAL LOW (ref 36.0–46.0)
Hemoglobin: 7.3 g/dL — ABNORMAL LOW (ref 12.0–15.0)
LYMPHS ABS: 1.3 10*3/uL (ref 0.7–4.0)
Lymphocytes Relative: 7 %
MCH: 29.4 pg (ref 26.0–34.0)
MCHC: 34.8 g/dL (ref 30.0–36.0)
MCV: 84.7 fL (ref 78.0–100.0)
Monocytes Absolute: 2.1 10*3/uL — ABNORMAL HIGH (ref 0.1–1.0)
Monocytes Relative: 11 %
Neutro Abs: 14.3 10*3/uL — ABNORMAL HIGH (ref 1.7–7.7)
Neutrophils Relative %: 81 %
PLATELETS: 433 10*3/uL — AB (ref 150–400)
RBC: 2.48 MIL/uL — AB (ref 3.87–5.11)
RDW: 17.8 % — ABNORMAL HIGH (ref 11.5–15.5)
WBC: 18 10*3/uL — AB (ref 4.0–10.5)

## 2016-11-12 LAB — RETICULOCYTES
RBC.: 2.48 MIL/uL — AB (ref 3.87–5.11)
RETIC CT PCT: 10.6 % — AB (ref 0.4–3.1)
Retic Count, Absolute: 262.9 10*3/uL — ABNORMAL HIGH (ref 19.0–186.0)

## 2016-11-12 MED ORDER — OXYCODONE-ACETAMINOPHEN 5-325 MG PO TABS
2.0000 | ORAL_TABLET | Freq: Once | ORAL | Status: AC
  Administered 2016-11-13: 2 via ORAL
  Filled 2016-11-12: qty 2

## 2016-11-12 NOTE — Progress Notes (Signed)
SICKLE CELL SERVICE PROGRESS NOTE  [name] [id number]:[id number] DOB: 1970-07-12 DOA: 11/08/2016 PCP: [name], FNP  Assessment/Plan: Principal Problem:   Acute chest syndrome [facility]) Active Problems:   Sickle cell anemia ([place])   1. Acute Chest Syndrome: Pt was assessed as having Acute Chest Syndrome by Dr. Doreene Burke and transfused 1 unit RBC's 2 days ago. Will check pulse oximetry on RA today. She has also been on antibiotics although she has no clinically compatible constellation of symptoms for pneumonia. Will discontiue antibiotics and observe.  2. Leukocytosis: Improved. Likely related to crisis.  3. Anemia of Chronic Disease: Hb at 7.3 g/dL today unchanged from pre-transfusion levels. Continue to monitor.  4. SIRS: Secondary to sickle cell crisis and Acute Chest Syndrome. 5. Hb SS with Crisis: Contiue PCA bolus at 0.4 mg and encourage increased usage. Pt unable to receive NSAID's as she is pregnant. 6. Early Stage Pregnancy: Avoid NSAID's and Hydrea.  7. Leg Wound: Consult WOC. Pt current using silver antimicrobial gel with dry dressing. Will substitute formulary silver antimicrobial product until seen by [place]. 8. Constipation: Asked nurse to administer PRN Miralax.    Code Status: Full Code Family Communication: N/A Disposition Plan: Not yet ready for discharge  [place].  Pager [phone]. If 7PM-7AM, please contact night-coverage.  11/12/2016, 10:24 AM  LOS: 3 days   Interim History: Pt much better appearing today and less somnolent than yesterday. She reports pain at 7/10 and localized to B/L ribs. She has used 24.4 mg of Dilaudid with 58/58:demnds/deliveries in the last 24 hours. Pt has not had a BM in 4 days.   Consultants:  None  Procedures:  Transfusion 1 unit RBC's  Antibiotics:  Levaquin 6/23 >>6/23  Ceftriaxone 6/23 >> 6/26  Azithromycin 6/23 >> 6/26    Objective: Vitals:   11/12/16 0010 11/12/16 0223 11/12/16 0400 11/12/16 0541  BP:  112/72 116/68  113/66  Pulse: 91 91  85  Resp: (!) 26 (!) 22 19 18   Temp: 98.3 F (36.8 C) 99.2 F (37.3 C)  98.2 F (36.8 C)  TempSrc: Oral Oral  Oral  SpO2: 100% 100% 99% 100%  Weight:      Height:       Weight change:   Intake/Output Summary (Last 24 hours) at 11/12/16 1024 Last data filed at 11/12/16 0544  Gross per 24 hour  Intake             1447 ml  Output             1160 ml  Net              287 ml     Physical Exam General: Alert, awake, oriented x3, in no acute distress. Less somnolent today and moving better. HEENT: [place]/AT PEERL, EOMI, anicteric  Heart: Regular rate and rhythm, without murmurs, rubs, gallops, PMI non-displaced, no heaves or thrills on palpation.  Lungs: Clear to auscultation, no wheezing or rhonchi noted. No increased vocal fremitus resonant to percussion.  Abdomen: Soft, nontender, nondistended, positive bowel sounds, no masses no hepatosplenomegaly noted  Neuro: No focal neurological deficits noted cranial nerves II through XII grossly intact. Strength at functional baseline in bilateral upper and lower extremities. Musculoskeletal: No warmth swelling or erythema around joints, no spinal tenderness noted. Psychiatric: Patient alert and oriented x3, good insight and cognition, good recent to remote recall.   Data Reviewed: Basic Metabolic Panel:  Recent Labs Lab 11/08/16 1152 11/08/16 2259 11/10/16 0534  NA 138 138 132*  K 3.7 3.4* 3.4*  CL 105 104 99*  CO2 24 27 25   GLUCOSE 97 109* 130*  BUN 7 9 6   CREATININE 0.31* 0.34* <0.30*  CALCIUM 9.1 9.2 8.4*   Liver Function Tests:  Recent Labs Lab 11/08/16 1152 11/08/16 2259 11/10/16 0534  AST 34 33 29  ALT 23 21 24   ALKPHOS 49 54 54  BILITOT 4.3* 6.4* 9.5*  PROT 7.9 8.4* 7.0  ALBUMIN 4.5 4.7 3.7   No results for input(s): LIPASE, AMYLASE in the last 168 hours. No results for input(s): AMMONIA in the last 168 hours. CBC:  Recent Labs Lab 11/08/16 1330 11/10/16 0534  11/12/16 0506  WBC 25.9* 26.5* 18.0*  NEUTROABS 19.7* 22.2* 14.3*  HGB 7.1* 7.3* 7.3*  HCT 20.1* 20.7* 21.0*  MCV 88.9 88.5 84.7  PLT 440* 425* 433*   Cardiac Enzymes: No results for input(s): CKTOTAL, CKMB, CKMBINDEX, TROPONINI in the last 168 hours. BNP (last 3 results)  Recent Labs  08/15/16 2007  BNP 48.3    ProBNP (last 3 results) No results for input(s): PROBNP in the last 8760 hours.  CBG: No results for input(s): GLUCAP in the last 168 hours.  Recent Results (from the past 240 hour(s))  Blood culture (routine x 2)     Status: None (Preliminary result)   Collection Time: 11/09/16  2:20 AM  Result Value Ref Range Status   Specimen Description BLOOD RIGHT ARM  Final   Special Requests   Final    BOTTLES DRAWN AEROBIC AND ANAEROBIC Blood Culture adequate volume   Culture   Final    NO GROWTH 2 DAYS Performed at [facility], [address] [place], [place] [address]    Report Status PENDING  Incomplete  Blood culture (routine x 2)     Status: None (Preliminary result)   Collection Time: 11/09/16  2:20 AM  Result Value Ref Range Status   Specimen Description BLOOD LEFT ANTECUBITAL  Final   Special Requests   Final    BOTTLES DRAWN AEROBIC AND ANAEROBIC Blood Culture adequate volume   Culture   Final    NO GROWTH 2 DAYS Performed at [facility], [place] [address] [place], [place] [address]    Report Status PENDING  Incomplete     Studies: Dg Chest 2 View  Result Date: 11/09/2016 CLINICAL DATA:  Acute onset of cough and generalized chest pain. Initial encounter. EXAM: CHEST  2 VIEW COMPARISON:  Chest radiograph performed 11/08/2016 FINDINGS: Right midlung opacity may reflect mild acute chest syndrome. Left basilar airspace opacity likely reflects atelectasis. The lungs are hypoexpanded. Mild vascular congestion is noted. The heart is mildly enlarged. No acute osseous abnormalities are identified. IMPRESSION: 1. Right midlung airspace opacity may  reflect mild acute chest syndrome. 2. Mild left basilar opacity likely reflects atelectasis. Lungs hypoexpanded. 3. Mild vascular congestion and mild cardiomegaly noted. Electronically Signed   By: Garald Balding M.D.   On: 11/09/2016 01:17   Dg Chest 2 View  Result Date: 11/08/2016 CLINICAL DATA:  Chest pain EXAM: CHEST  2 VIEW COMPARISON:  09/09/2016 FINDINGS: Heart is enlarged. Bibasilar atelectasis. No effusions or edema. No acute bony abnormality. IMPRESSION: Cardiomegaly.  Bibasilar atelectasis. Electronically Signed   By: Rolm Baptise M.D.   On: 11/08/2016 11:35    Scheduled Meds: . enoxaparin (LOVENOX) injection  40 mg Subcutaneous Q24H  . folic acid  1 mg Oral Daily  . HYDROmorphone   Intravenous Q4H  . L-glutamine  5 g Oral BID  . polyethylene glycol  17 g Oral Daily  . senna-docusate  1 tablet Oral BID   Continuous Infusions: . sodium chloride 10 mL/hr at 11/11/16 1722  . azithromycin Stopped (11/11/16 2259)  . cefTRIAXone (ROCEPHIN)  IV Stopped (11/11/16 2229)  . diphenhydrAMINE (BENADRYL) IVPB(SICKLE CELL ONLY)      Principal Problem:   Acute chest syndrome ([facility]) Active Problems:   Sickle cell anemia (H[facility])     In excess of 25 minutes spent during this visit. Greater than 50% involved face to face contact with the patient for assessment, counseling and coordination of care.

## 2016-11-12 NOTE — Progress Notes (Signed)
Pt O2 sat 98% on 2 L 

## 2016-11-12 NOTE — Progress Notes (Signed)
spO2 100% via dinamap

## 2016-11-12 NOTE — Consult Note (Signed)
[facility] Nurse wound consult note Reason for Consult: Patient known to our team, last seen in April of this year Wound type: Venous insufficiency Pressure Injury POA: No Measurement: 8cm x 3.5cm x 0.2cm (both ulcers are identical in size) Wound bed:75% red, 25% obscured by the presence of fibrinous slough (white) Drainage (amount, consistency, odor) moderate amounts of thick yellow drainage Periwound: In tact with evidence of previous wound healing Dressing procedure/placement/frequency: Patient is followed by the outpatient wound care center at [facility] and is well versed in her care, our formulary and what works for her.  He is most concerned about dressing sticking to her wound bed.  She typically showers prior to dressing removal at home.  She is in agreement with Dr. Zigmund Daniel suggested POC to use our house silver hydrofiber (Aquacel Ag+) if we change it twice daily so that it does not adhere. I will provide orders for this as it supports the goals of antimicrobial donation, absorption and protection. While the change frequency is above what might be typically indicated, she will return to the silver gel dressings upon discharge and will not have regression while under our care. Thank you for inviting me to consult on this nice young woman. [facility] nursing team will not follow, but will remain available to this patient, the nursing and medical teams.  Please reconsult if needed.  Maudie Flakes, MSN, RN, [place], [facility]  Pager# [phone]

## 2016-11-13 DIAGNOSIS — L97909 Non-pressure chronic ulcer of unspecified part of unspecified lower leg with unspecified severity: Secondary | ICD-10-CM

## 2016-11-13 DIAGNOSIS — E876 Hypokalemia: Secondary | ICD-10-CM

## 2016-11-13 MED ORDER — OXYCODONE HCL 5 MG PO TABS
10.0000 mg | ORAL_TABLET | ORAL | Status: DC
  Administered 2016-11-13 – 2016-11-14 (×5): 10 mg via ORAL
  Filled 2016-11-13 (×5): qty 2

## 2016-11-13 NOTE — Progress Notes (Signed)
O2 sats 94% while patient is at rest/sleeping. Oxygen had been turned off for about 30 minutes prior to reading and patient was asleep during that time.   [name] [facility] 11/13/2016 8:23 AM

## 2016-11-13 NOTE — Progress Notes (Signed)
Patient refused dressing change at this time. States it wasn't changed until late yesterday and that sometimes she does not change it everyday at home if the dressing is not soiled. Told patient to call RN if she decides that the dressing needs to be changed today.

## 2016-11-13 NOTE — Progress Notes (Signed)
SICKLE CELL SERVICE PROGRESS NOTE  [name] WNU:[id number] DOB: Dec 14, 1970 DOA: 11/08/2016 PCP: [name], FNP  Assessment/Plan: Principal Problem:   Acute chest syndrome [facility]) Active Problems:   Sickle cell anemia ([place])  1. Hypokalemia: Labs today shows a potassium levels 2.9. IV potassium has been ordered for placement and magnesium is being checked. 2. Hb SS with Crisis: Today her pain is essentially at her baseline. Will discontinue the PCA. Continue oral analgesics as previously prescribed. 3. Acute chest syndrome: Completely resolved. The patient at this point requires no oxygen is ambulatory and maintaining saturations at 96-100%. 4. Systemic inflammatory response syndrome: Secondary to acute chest syndrome now completely resolved. 5. Leg ulcer: This is a chronic condition and there was continuation of wound dressing with Aquasol silver Hydrofiber dressing.  6. Anemia of chronic disease: Within the first 48 hours of admission the patient was transfused 2 units of RBC's. Her pre-and post transfusion hemoglobin remained essentially unchanged. At this time there is no indication for further transfusions 7. Early pregnancy: Patient to follow-up with her primary provider and obstetrician as an outpatient 8. Constipation: Patient had bowel movement following administration of laxatives yesterday.   Code Status: Full Code Family Communication: N/A Disposition Plan:Anticipate  Discharge tomorrow  [name],[name]  Pager [phone]. If 7PM-7AM, please contact night-coverage.  11/13/2016, 6:32 PM  LOS: 4 days   Interim History: Currently the patient has no complaints. She states that her pain has only been intermittent in baseline.        Objective: Vitals:   11/13/16 1028 11/13/16 1122 11/13/16 1331 11/13/16 1600  BP: 107/66  111/67   Pulse: 88  88   Resp: 16 (!) 21  (!) 21  Temp: 99.3 F (37.4 C)  98.4 F (36.9 C)   TempSrc: Oral  Oral   SpO2: 96% 97% 97%  96%  Weight:      Height:       Weight change:   Intake/Output Summary (Last 24 hours) at 11/13/16 1832 Last data filed at 11/13/16 1800  Gross per 24 hour  Intake           362.67 ml  Output             2050 ml  Net         -1687.33 ml      Physical Exam General: Alert, awake, oriented x3, in no acute distress. Well appearing HEENT: [place]/AT PEERL, EOMI, very mild icterus  Neck: Trachea midline,  no masses, no thyromegal,y no JVD, no carotid bruit OROPHARYNX:  Moist, No exudate/ erythema/lesions.  Heart: Regular rate and rhythm, without murmurs, rubs, gallops, PMI non-displaced, no heaves or thrills on palpation.  Lungs: Clear to auscultation, no wheezing or rhonchi noted. No increased vocal fremitus resonant to percussion  Abdomen: Soft, nontender, nondistended, positive bowel sounds, no masses no hepatosplenomegaly noted..  Neuro: No focal neurological deficits noted cranial nerves II through XII grossly intact.  Strength at baseline in bilateral upper and lower extremities. Patient up ambulating without any shortness of breath or decrease in oxygen. Musculoskeletal: No warmth swelling or erythema around joints, no spinal tenderness noted. Psychiatric: Patient alert and oriented x3, good insight and cognition, good recent to remote recall.    Data Reviewed: Basic Metabolic Panel:  Recent Labs Lab 11/08/16 1152 11/08/16 2259 11/10/16 0534  NA 138 138 132*  K 3.7 3.4* 3.4*  CL 105 104 99*  CO2 24 27 25   GLUCOSE 97 109* 130*  BUN 7 9  6  CREATININE 0.31* 0.34* <0.30*  CALCIUM 9.1 9.2 8.4*   Liver Function Tests:  Recent Labs Lab 11/08/16 1152 11/08/16 2259 11/10/16 0534  AST 34 33 29  ALT 23 21 24   ALKPHOS 49 54 54  BILITOT 4.3* 6.4* 9.5*  PROT 7.9 8.4* 7.0  ALBUMIN 4.5 4.7 3.7   No results for input(s): LIPASE, AMYLASE in the last 168 hours. No results for input(s): AMMONIA in the last 168 hours. CBC:  Recent Labs Lab 11/08/16 1330 11/10/16 0534  11/12/16 0506  WBC 25.9* 26.5* 18.0*  NEUTROABS 19.7* 22.2* 14.3*  HGB 7.1* 7.3* 7.3*  HCT 20.1* 20.7* 21.0*  MCV 88.9 88.5 84.7  PLT 440* 425* 433*   Cardiac Enzymes: No results for input(s): CKTOTAL, CKMB, CKMBINDEX, TROPONINI in the last 168 hours. BNP (last 3 results)  Recent Labs  08/15/16 2007  BNP 48.3    ProBNP (last 3 results) No results for input(s): PROBNP in the last 8760 hours.  CBG: No results for input(s): GLUCAP in the last 168 hours.  Recent Results (from the past 240 hour(s))  Blood culture (routine x 2)     Status: None (Preliminary result)   Collection Time: 11/09/16  2:20 AM  Result Value Ref Range Status   Specimen Description BLOOD RIGHT ARM  Final   Special Requests   Final    BOTTLES DRAWN AEROBIC AND ANAEROBIC Blood Culture adequate volume   Culture   Final    NO GROWTH 4 DAYS Performed at [facility], [address], [place], [place] [address]    Report Status PENDING  Incomplete  Blood culture (routine x 2)     Status: None (Preliminary result)   Collection Time: 11/09/16  2:20 AM  Result Value Ref Range Status   Specimen Description BLOOD LEFT ANTECUBITAL  Final   Special Requests   Final    BOTTLES DRAWN AEROBIC AND ANAEROBIC Blood Culture adequate volume   Culture   Final    NO GROWTH 4 DAYS Performed at [facility], [place] [address], [place] [address]    Report Status PENDING  Incomplete     Studies: Dg Chest 2 View  Result Date: 11/09/2016 CLINICAL DATA:  Acute onset of cough and generalized chest pain. Initial encounter. EXAM: CHEST  2 VIEW COMPARISON:  Chest radiograph performed 11/08/2016 FINDINGS: Right midlung opacity may reflect mild acute chest syndrome. Left basilar airspace opacity likely reflects atelectasis. The lungs are hypoexpanded. Mild vascular congestion is noted. The heart is mildly enlarged. No acute osseous abnormalities are identified. IMPRESSION: 1. Right midlung airspace opacity may  reflect mild acute chest syndrome. 2. Mild left basilar opacity likely reflects atelectasis. Lungs hypoexpanded. 3. Mild vascular congestion and mild cardiomegaly noted. Electronically Signed   By: Garald Balding M.D.   On: 11/09/2016 01:17   Dg Chest 2 View  Result Date: 11/08/2016 CLINICAL DATA:  Chest pain EXAM: CHEST  2 VIEW COMPARISON:  09/09/2016 FINDINGS: Heart is enlarged. Bibasilar atelectasis. No effusions or edema. No acute bony abnormality. IMPRESSION: Cardiomegaly.  Bibasilar atelectasis. Electronically Signed   By: Rolm Baptise M.D.   On: 11/08/2016 11:35    Scheduled Meds: . enoxaparin (LOVENOX) injection  40 mg Subcutaneous Q24H  . folic acid  1 mg Oral Daily  . HYDROmorphone   Intravenous Q4H  . L-glutamine  5 g Oral BID  . oxyCODONE  10 mg Oral Q4H  . polyethylene glycol  17 g Oral Daily  .  senna-docusate  1 tablet Oral BID   Continuous Infusions: . sodium chloride 10 mL/hr at 11/12/16 1945  . diphenhydrAMINE (BENADRYL) IVPB(SICKLE CELL ONLY)      Principal Problem:   Acute chest syndrome (HCC) Active Problems:   Sickle cell anemia (H[facility])       In excess of 25 minutes spent during this visit. Greater than 50% involved face to face contact with the patient for assessment, counseling and coordination of care.

## 2016-11-14 DIAGNOSIS — A419 Sepsis, unspecified organism: Secondary | ICD-10-CM

## 2016-11-14 DIAGNOSIS — I83019 Varicose veins of right lower extremity with ulcer of unspecified site: Secondary | ICD-10-CM

## 2016-11-14 DIAGNOSIS — L97919 Non-pressure chronic ulcer of unspecified part of right lower leg with unspecified severity: Secondary | ICD-10-CM

## 2016-11-14 LAB — BASIC METABOLIC PANEL
ANION GAP: 11 (ref 5–15)
BUN: 7 mg/dL (ref 6–20)
CALCIUM: 9.2 mg/dL (ref 8.9–10.3)
CO2: 27 mmol/L (ref 22–32)
CREATININE: 0.35 mg/dL — AB (ref 0.44–1.00)
Chloride: 98 mmol/L — ABNORMAL LOW (ref 101–111)
GLUCOSE: 118 mg/dL — AB (ref 65–99)
Potassium: 2.9 mmol/L — ABNORMAL LOW (ref 3.5–5.1)
Sodium: 136 mmol/L (ref 135–145)

## 2016-11-14 LAB — CBC WITH DIFFERENTIAL/PLATELET
BASOS ABS: 0.1 10*3/uL (ref 0.0–0.1)
BASOS PCT: 1 %
Eosinophils Absolute: 0.4 10*3/uL (ref 0.0–0.7)
Eosinophils Relative: 4 %
HEMATOCRIT: 21.4 % — AB (ref 36.0–46.0)
Hemoglobin: 7.1 g/dL — ABNORMAL LOW (ref 12.0–15.0)
Lymphocytes Relative: 16 %
Lymphs Abs: 1.8 10*3/uL (ref 0.7–4.0)
MCH: 27.2 pg (ref 26.0–34.0)
MCHC: 33.2 g/dL (ref 30.0–36.0)
MCV: 82 fL (ref 78.0–100.0)
MONO ABS: 1.7 10*3/uL — AB (ref 0.1–1.0)
MONOS PCT: 16 %
NEUTROS ABS: 6.8 10*3/uL (ref 1.7–7.7)
NEUTROS PCT: 63 %
Platelets: 509 10*3/uL — ABNORMAL HIGH (ref 150–400)
RBC: 2.61 MIL/uL — ABNORMAL LOW (ref 3.87–5.11)
RDW: 19.7 % — AB (ref 11.5–15.5)
WBC: 10.8 10*3/uL — ABNORMAL HIGH (ref 4.0–10.5)

## 2016-11-14 LAB — CULTURE, BLOOD (ROUTINE X 2)
CULTURE: NO GROWTH
CULTURE: NO GROWTH
Special Requests: ADEQUATE
Special Requests: ADEQUATE

## 2016-11-14 LAB — MAGNESIUM: MAGNESIUM: 1.8 mg/dL (ref 1.7–2.4)

## 2016-11-14 MED ORDER — POTASSIUM CHLORIDE 10 MEQ/100ML IV SOLN
10.0000 meq | INTRAVENOUS | Status: AC
  Administered 2016-11-14 (×2): 10 meq via INTRAVENOUS
  Filled 2016-11-14 (×2): qty 100

## 2016-11-14 MED ORDER — MAGNESIUM OXIDE 400 (241.3 MG) MG PO TABS: 400.0000 mg | ORAL_TABLET | Freq: Every day | ORAL | Status: DC

## 2016-11-14 MED ORDER — MAGNESIUM OXIDE 400 (241.3 MG) MG PO TABS: 400.0000 mg | ORAL_TABLET | Freq: Every day | ORAL | 0 refills | Status: DC

## 2016-11-14 MED ORDER — PRENATAL VITAMINS 0.8 MG PO TABS: 1.0000 | ORAL_TABLET | Freq: Every day | ORAL | 1 refills | Status: DC

## 2016-11-14 NOTE — Discharge Summary (Signed)
[name] MRN: [id number] DOB/AGE: Mar 30, 1971 46 y.o.  Admit date: 11/08/2016 Discharge date: 11/14/2016  Primary Care Physician:  [name], FNP   Discharge Diagnoses:   Patient Active Problem List   Diagnosis Date Noted  . Sickle cell pain crisis ([place]) 10/10/2012    Priority: Medium  . Sickle cell anemia ([place]) 11/09/2016  . (Resolved ) Hb-SS disease with crisis ([place]) 08/16/2016  . 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 Previous cesarean delivery, antepartum condition or complication 96/78/9381  . Hemochromatosis 11/10/2012  . (Resolved ) Acute chest syndrome ([place]) 10/13/2012  . Chronic pain syndrome 10/13/2012  . Sinus bradycardia by electrocardiogram 04/03/2012  . Chronic wound of extremity 04/01/2012  . Leukocytosis 03/30/2012    DISCHARGE MEDICATION: Allergies as of 11/14/2016   No Known Allergies     Medication List    TAKE these medications   folic acid 1 MG tablet Commonly known as:  FOLVITE Take 1 tablet (1 mg total) by mouth daily.   L-glutamine 5 g Pack Powder Packet Commonly known as:  ENDARI Take 5 g by mouth 2 (two) times daily.   magnesium oxide 400 (241.3 Mg) MG tablet Commonly known as:  MAG-OX Take 1 tablet (400 mg total) by mouth daily.   Oxycodone HCl 10 MG Tabs Take 1 tablet (10 mg total) by mouth every 4 (four) hours as needed (pain).   Prenatal Vitamins 0.8 MG tablet Take 1 tablet by mouth daily.   RESTA SILVER Gel Apply topically daily.   Vitamin D 2000 units Caps Take 2 capsules (4,000 Units total) by mouth daily.         Consults:    SIGNIFICANT DIAGNOSTIC STUDIES:  Dg Chest 2 View  Result Date: 11/09/2016 CLINICAL DATA:  Acute onset of cough and generalized chest pain. Initial encounter. EXAM: CHEST  2 VIEW COMPARISON:  Chest radiograph performed 11/08/2016 FINDINGS: Right midlung opacity may reflect mild acute chest syndrome. Left basilar airspace opacity likely reflects atelectasis. The lungs are  hypoexpanded. Mild vascular congestion is noted. The heart is mildly enlarged. No acute osseous abnormalities are identified. IMPRESSION: 1. Right midlung airspace opacity may reflect mild acute chest syndrome. 2. Mild left basilar opacity likely reflects atelectasis. Lungs hypoexpanded. 3. Mild vascular congestion and mild cardiomegaly noted. Electronically Signed   By: Garald Balding M.D.   On: 11/09/2016 01:17   Dg Chest 2 View  Result Date: 11/08/2016 CLINICAL DATA:  Chest pain EXAM: CHEST  2 VIEW COMPARISON:  09/09/2016 FINDINGS: Heart is enlarged. Bibasilar atelectasis. No effusions or edema. No acute bony abnormality. IMPRESSION: Cardiomegaly.  Bibasilar atelectasis. Electronically Signed   By: Rolm Baptise M.D.   On: 11/08/2016 11:35       Recent Results (from the past 240 hour(s))  Blood culture (routine x 2)     Status: None (Preliminary result)   Collection Time: 11/09/16  2:20 AM  Result Value Ref Range Status   Specimen Description BLOOD RIGHT ARM  Final   Special Requests   Final    BOTTLES DRAWN AEROBIC AND ANAEROBIC Blood Culture adequate volume   Culture   Final    NO GROWTH 4 DAYS Performed at [facility], [address], [place],  [address]    Report Status PENDING  Incomplete  Blood culture (routine x 2)     Status: None (Preliminary result)   Collection Time: 11/09/16  2:20 AM  Result Value Ref Range Status   Specimen Description BLOOD LEFT ANTECUBITAL  Final   Special Requests   Final    BOTTLES DRAWN AEROBIC AND ANAEROBIC Blood Culture adequate volume   Culture   Final    NO GROWTH 4 DAYS Performed at [facility], [address] [place], [place] [address]    Report Status PENDING  Incomplete    BRIEF ADMITTING H & P: [name] is a 46 y.o. female with a past medical history significant for sickle cell disease and chronic leg wound and recurrent Acute chest syndrome who presents with complaints of sickle cell pain.  The patient  was in her usual state of health until about 24 hours ago in the morning when she woke up with worsening chest pain and thigh pain. She tried taking her home ibuprofen and oxycodone without relief and then came to the emergency room where she was given IV Dilaudid and felt some improvement in left. However at home her pain continued, the pain in her chest worsened and so she came back to the emergency room. She has no fever, cough, sputum, dyspnea.  Her chest hurts a lot, hard to say if worse with inspiration.  She has had acute chest in the past, no history of DVT/PE. She has mild leg swelling.   Hospital Course:  Present on Admission: . Acute chest syndrome ([place]) . Sickle cell anemia (HCC)  This is an opiate tolerant patient with hemoglobin SS who was admitted with sickle cell crisis. On evaluation in the emergency department chest x-ray showed right midlung airspace opacity likely representing DG chest syndrome. She had only a mild hypoxemia however the patient was treated as acute chest syndrome and received a unit of blood on admission. On the day subsequent to admission the treating physician assess the need for further transfusion and the patient was transfused an additional unit of rbc's for a total of 2 units of rbc's transfused during hospitalization. There was no significant change in her left lip hemoglobin which was at baseline throughout her hospital stay. She had an adequate reticulocytosis for the degree of her chronic anemia. At the time of discharge the patient had no requirement for oxygen supplementation and her tolerance to activity was normal with no increased work of breathing.  Her pain of sickle cell crisis was treated with Dilaudid via the PCA and IV fluids. Due to a new diagnosis of early pregnancy Toradol was not administered as is contraindicated. Her pain improved and she was transitioned to oral analgesics.  The patient also had a hypokalemia of 2.9. This is placed both by  IV and oral supplementation. Magnesium was checked and was found to be within normal range.  As noted above the patient had a routine pregnancy test performed which resulted as positive. The patient will follow-up with her primary provider who make a determination about referral to obstetrics.  Patient also has chronic venous insufficiency wound on the right lower extremity. During hospitalization dressing changes are substituted with Aquasol silver impregnated antimicrobial dressing. She is to resume use of Resta silver impregnated gel dressing.  Disposition and Follow-up: Discharge home in stable condition. Follow-up with primary provider with referral to obstetrician.  Discharge Instructions    Activity as tolerated - No restrictions    Complete by:  As directed    Diet general    Complete by:  As directed       DISCHARGE EXAM:  General: Alert, awake, oriented x3, well-appearing and in no distress  HEENT: [place]/AT PEERL, EOMI, anicteric Neck: Trachea midline,  no masses, no thyromegal,y no JVD, no carotid bruit OROPHARYNX: Moist, No exudate/ erythema/lesions.  Heart: Regular rate and rhythm, without murmurs, rubs, gallops or S3. PMI non-displaced. Exam reveals no decreased pulses. Pulmonary/Chest: Normal effort. Breath sounds normal. No. Apnea. Clear to auscultation,no stridor,  no wheezing and no rhonchi noted. No respiratory distress and no tenderness noted. Abdomen: Soft, nontender, nondistended, normal bowel sounds, no masses no hepatosplenomegaly noted. No fluid wave and no ascites. There is no guarding or rebound. Neuro: Alert and oriented to person, place and time. Normal motor skills, Displays no atrophy or tremors and exhibits normal muscle tone.  No focal neurological deficits noted cranial nerves II through XII grossly intact. No sensory deficit noted. Strength at baseline in bilateral upper and lower extremities. Gait normal. Musculoskeletal: No warmth swelling or erythema  around joints, no spinal tenderness noted. Psychiatric: Patient alert and oriented x3, good insight and cognition, good recent to remote recall. Lymph node survey: No cervical axillary or inguinal lymphadenopathy noted. Skin: Skin is warm and dry. No bruising, no ecchymosis and no rash noted. Pt is not diaphoretic. No erythema. No pallor. Except on the right lower extremity patient has 2 skin ulcers on the lower leg both approximately 8 cm x 3.5 cm in size. The wound bed has fibrinous slough with moderate amount of thick yellow drainage. Periwound area is intact.   Blood pressure 109/73, pulse 90, temperature 98.4 F (36.9 C), temperature source Oral, resp. rate 18, height 5\' 7"  (1.702 m), weight 59.4 kg (130 lb 15.3 oz), last menstrual period 10/10/2016, SpO2 96 %.   Recent Labs  11/14/16 0505  NA 136  K 2.9*  CL 98*  CO2 27  GLUCOSE 118*  BUN 7  CREATININE 0.35*  CALCIUM 9.2  MG 1.8   No results for input(s): AST, ALT, ALKPHOS, BILITOT, PROT, ALBUMIN in the last 72 hours. No results for input(s): LIPASE, AMYLASE in the last 72 hours.  Recent Labs  11/12/16 0506 11/14/16 0505  WBC 18.0* 10.8*  NEUTROABS 14.3* 6.8  HGB 7.3* 7.1*  HCT 21.0* 21.4*  MCV 84.7 82.0  PLT 433* 509*     Total time spent including face to face and decision making was greater than 30 minutes  Signed: Corynn Solberg A. 11/14/2016, 3:12 PM

## 2016-11-19 ENCOUNTER — Ambulatory Visit (INDEPENDENT_AMBULATORY_CARE_PROVIDER_SITE_OTHER): Payer: Medicaid Other | Admitting: Family Medicine

## 2016-11-19 ENCOUNTER — Encounter: Payer: Self-pay | Admitting: Family Medicine

## 2016-11-19 VITALS — BP 118/67 | HR 72 | Temp 98.6°F | Resp 16 | Ht 67.0 in | Wt 124.0 lb

## 2016-11-19 DIAGNOSIS — Z3490 Encounter for supervision of normal pregnancy, unspecified, unspecified trimester: Secondary | ICD-10-CM

## 2016-11-19 DIAGNOSIS — O0991 Supervision of high risk pregnancy, unspecified, first trimester: Secondary | ICD-10-CM

## 2016-11-19 DIAGNOSIS — E876 Hypokalemia: Secondary | ICD-10-CM

## 2016-11-19 DIAGNOSIS — Z3491 Encounter for supervision of normal pregnancy, unspecified, first trimester: Secondary | ICD-10-CM

## 2016-11-19 DIAGNOSIS — D571 Sickle-cell disease 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: Secondary | ICD-10-CM | POA: Diagnosis not present

## 2016-11-19 LAB — COMPLETE METABOLIC PANEL WITH GFR
ALT: 20 U/L (ref 6–29)
AST: 20 U/L (ref 10–35)
Albumin: 3.9 g/dL (ref 3.6–5.1)
Alkaline Phosphatase: 103 U/L (ref 33–115)
BUN: 4 mg/dL — AB (ref 7–25)
CALCIUM: 9.4 mg/dL (ref 8.6–10.2)
CHLORIDE: 102 mmol/L (ref 98–110)
CO2: 27 mmol/L (ref 20–31)
Creat: 0.35 mg/dL — ABNORMAL LOW (ref 0.50–1.10)
GFR, Est African American: >89 mL/min (ref >=60)
GFR, Est Non African American: >89 mL/min (ref >=60)
Glucose, Bld: 86 mg/dL (ref 65–99)
POTASSIUM: 4 mmol/L (ref 3.5–5.3)
SODIUM: 135 mmol/L (ref 135–146)
Total Bilirubin: 1.7 mg/dL — ABNORMAL HIGH (ref 0.2–1.2)
Total Protein: 7.3 g/dL (ref 6.1–8.1)

## 2016-11-19 LAB — POCT URINALYSIS DIP (DEVICE)
BILIRUBIN URINE: NEGATIVE
Glucose, UA: NEGATIVE mg/dL
Hgb urine dipstick: NEGATIVE
KETONES UR: NEGATIVE mg/dL
Leukocytes, UA: NEGATIVE
Nitrite: NEGATIVE
PH: 7 (ref 5.0–8.0)
Protein, ur: NEGATIVE mg/dL
SPECIFIC GRAVITY, URINE: 1.015 (ref 1.005–1.030)
Urobilinogen, UA: 1 mg/dL (ref 0.0–1.0)

## 2016-11-19 LAB — HCG, QUANTITATIVE, PREGNANCY: HCG, BETA CHAIN, QUANT, S: 11882.6 m[IU]/mL — AB

## 2016-11-19 MED FILL — oxyCODONE HCL 10 MG TABS: 10 | 15 days supply | Qty: 90 | Fill #0

## 2016-11-19 MED FILL — IBUPROFEN 600 MG TABLET: 600 | 20 days supply | Qty: 60 | Fill #0

## 2016-11-19 MED FILL — MAGNESIUM OXIDE 400 MG TAB: 400 (240 MG | 30 days supply | Qty: 30 | Fill #0

## 2016-11-19 NOTE — Patient Instructions (Addendum)
High Risk Pregnancy:  Continue Prenatal vitamin Continue folic acid Increase water intake to 6-8 glasses per day Will follow up by phone with any abnormal laboratory results  First Trimester of Pregnancy The first trimester of pregnancy is from week 1 until the end of week 13 (months 1 through 3). During this time, your baby will begin to develop inside you. At 6-8 weeks, the eyes and face are formed, and the heartbeat can be seen on ultrasound. At the end of 12 weeks, all the baby's organs are formed. Prenatal care is all the medical care you receive before the birth of your baby. Make sure you get good prenatal care and follow all of your doctor's instructions. Follow these instructions at home: Medicines  Take over-the-counter and prescription medicines only as told by your doctor. Some medicines are safe and some medicines are not safe during pregnancy.  Take a prenatal vitamin that contains at least 600 micrograms (mcg) of folic acid.  If you have trouble pooping (constipation), take medicine that will make your stool soft (stool softener) if your doctor approves. Eating and drinking  Eat regular, healthy meals.  Your doctor will tell you the amount of weight gain that is right for you.  Avoid raw meat and uncooked cheese.  If you feel sick to your stomach (nauseous) or throw up (vomit): ? Eat 4 or 5 small meals a day instead of 3 large meals. ? Try eating a few soda crackers. ? Drink liquids between meals instead of during meals.  To prevent constipation: ? Eat foods that are high in fiber, like fresh fruits and vegetables, whole grains, and beans. ? Drink enough fluids to keep your pee (urine) clear or pale yellow. Activity  Exercise only as told by your doctor. Stop exercising if you have cramps or pain in your lower belly (abdomen) or low back.  Do not exercise if it is too hot, too humid, or if you are in a place of great height (high altitude).  Try to avoid standing  for long periods of time. Move your legs often if you must stand in one place for a long time.  Avoid heavy lifting.  Wear low-heeled shoes. Sit and stand up straight.  You can have sex unless your doctor tells you not to. Relieving pain and discomfort  Wear a good support bra if your breasts are sore.  Take warm water baths (sitz baths) to soothe pain or discomfort caused by hemorrhoids. Use hemorrhoid cream if your doctor says it is okay.  Rest with your legs raised if you have leg cramps or low back pain.  If you have puffy, bulging veins (varicose veins) in your legs: ? Wear support hose or compression stockings as told by your doctor. ? Raise (elevate) your feet for 15 minutes, 3-4 times a day. ? Limit salt in your food. Prenatal care  Schedule your prenatal visits by the twelfth week of pregnancy.  Write down your questions. Take them to your prenatal visits.  Keep all your prenatal visits as told by your doctor. This is important. Safety  Wear your seat belt at all times when driving.  Make a list of emergency phone numbers. The list should include numbers for family, friends, the hospital, and police and fire departments. General instructions  Ask your doctor for a referral to a local prenatal class. Begin classes no later than at the start of month 6 of your pregnancy.  Ask for help if you need counseling or  if you need help with nutrition. Your doctor can give you advice or tell you where to go for help.  Do not use hot tubs, steam rooms, or saunas.  Do not douche or use tampons or scented sanitary pads.  Do not cross your legs for long periods of time.  Avoid all herbs and alcohol. Avoid drugs that are not approved by your doctor.  Do not use any tobacco products, including cigarettes, chewing tobacco, and electronic cigarettes. If you need help quitting, ask your doctor. You may get counseling or other support to help you quit.  Avoid cat litter boxes and  soil used by cats. These carry germs that can cause birth defects in the baby and can cause a loss of your baby (miscarriage) or stillbirth.  Visit your dentist. At home, brush your teeth with a soft toothbrush. Be gentle when you floss. Contact a doctor if:  You are dizzy.  You have mild cramps or pressure in your lower belly.  You have a nagging pain in your belly area.  You continue to feel sick to your stomach, you throw up, or you have watery poop (diarrhea).  You have a bad smelling fluid coming from your vagina.  You have pain when you pee (urinate).  You have increased puffiness (swelling) in your face, hands, legs, or ankles. Get help right away if:  You have a fever.  You are leaking fluid from your vagina.  You have spotting or bleeding from your vagina.  You have very bad belly cramping or pain.  You gain or lose weight rapidly.  You throw up blood. It may look like coffee grounds.  You are around people who have [place] measles, fifth disease, or chickenpox.  You have a very bad headache.  You have shortness of breath.  You have any kind of trauma, such as from a fall or a car accident. Summary  The first trimester of pregnancy is from week 1 until the end of week 13 (months 1 through 3).  To take care of yourself and your unborn baby, you will need to eat healthy meals, take medicines only if your doctor tells you to do so, and do activities that are safe for you and your baby.  Keep all follow-up visits as told by your doctor. This is important as your doctor will have to ensure that your baby is healthy and growing well. This information is not intended to replace advice given to you by your health care provider. Make sure you discuss any questions you have with your health care provider. Document Released: 10/23/2007 Document Revised: 05/14/2016 Document Reviewed: 05/14/2016 Elsevier Interactive Patient Education  2017 [employer].

## 2016-11-19 NOTE — Progress Notes (Signed)
Subjective:    Patient ID: [name], female    DOB: 06-19-1970, 46 y.o.   MRN: [id number]  HPI [name], a 46 year old female with a history of sickle cell anemia, HbSS and chronic leg ulcers  presents for a post hospital follow-up. [name] was admitted to inpatient services for a sickle cell pain crisis and acute chest syndrome on on 11/09/2016. She was treated with IV pain medications and fluids throughout hospital course. She was also transfused 1 unit of packed red blood cells. She says that pain intensity improved since hospital discharge on 11/14/2016.  Today her pain intensity is 3/10. Pain is controlled on Oxycodone 10 mg and it was last taken this am. She continues to follow with wound care for right lower extremity non pressure ulcer. She has an appointment scheduled to follow up with wound care on 11/21/2016. Marland Kitchen She was told to continue to wash  wound with soap and water daily. She is also applying santyl and following weekly. She currently denies headache, chest pain, shortness of breath, nausea, vomiting diarrhea, or constipation.    [name] also had a positive pregnancy test during recent hospital admission. She did not plan pregnancy. She had a spontaneous abortion on 05/23/2016. She says that her last menstrual cycle was on 10/10/2016. Recent beta HCG was 524.   Past Medical History:  Diagnosis Date  . Anemia 03/30/2012   Hx of sickle cell disease  . Anemia complicating pregnancy 3/55/7322  . CAP (community acquired pneumonia)    2014  . Cholelithiasis y-1  . Cholelithiasis   . Elevated LFTs   . Leg ulcer ([place]) 10/27/2012   Chronic under care of wound clinic  . Multiple open wounds of lower extremity    chronic wounds B/LLE  . Sickle cell disease ([place])    Immunization History  Administered Date(s) Administered  . Influenza,inj,Quad PF,36+ Mos 03/16/2013, 01/20/2014, 03/01/2015, 02/19/2016  . Pneumococcal Conjugate-13 12/14/2015  . Pneumococcal  Polysaccharide-23 03/24/2014  . Tdap 11/25/2013   Social History   Social History  . Marital status: Married    Spouse name: [name]  . Number of children: 1  . Years of education: N/A   Occupational History  . Employed in home care.    Social History Main Topics  . Smoking status: Never Smoker  . Smokeless tobacco: Never Used  . Alcohol use No  . Drug use: No  . Sexual activity: Not Currently    Birth control/ protection: None   Other Topics Concern  . Not on file   Social History Narrative   Lives with husband.  No Known Allergies  Review of Systems  Constitutional: Negative for fever.  HENT: Negative.   Eyes: Negative for visual disturbance.  Respiratory: Negative.   Cardiovascular: Negative.   Gastrointestinal: Negative.   Endocrine: Negative.  Negative for polydipsia, polyphagia and polyuria.  Musculoskeletal: Positive for myalgias.       Pain to right lower extremity  Skin: Negative.        Right lower extremity ulcers  Neurological: Negative.   Hematological: Negative.   Psychiatric/Behavioral: Negative.        Objective:   Physical Exam  Constitutional: She is oriented to person, place, and time. She appears well-nourished.  HENT:  Head: Normocephalic and atraumatic.  Right Ear: External ear normal.  Left Ear: External ear normal.  Nose: Nose normal.  Mouth/Throat: Oropharynx is clear and moist.  Eyes: Conjunctivae and EOM are normal. Pupils are equal,  round, and reactive to light.  Neck: Normal range of motion. Neck supple.  Cardiovascular: Normal rate, normal heart sounds and intact distal pulses.   Pulmonary/Chest: Effort normal and breath sounds normal.  Abdominal: Soft. Bowel sounds are normal.  Musculoskeletal: Normal range of motion.  Neurological: She is alert and oriented to person, place, and time. She has normal reflexes.  Skin: Skin is warm and dry.  Bilateral lower extremity ulcers, unable to examine due to dressings.     Psychiatric: She has a normal mood and affect. Her behavior is normal. Judgment and thought content normal.     BP 118/67 (BP Location: Left Arm, Patient Position: Sitting, Cuff Size: Normal)   Pulse 72   Temp 98.6 F (37 C) (Oral)   Resp 16   Ht 5\' 7"  (1.702 m)   Wt 124 lb (56.2 kg)   LMP 10/10/2016 Comment: patient double shielded  SpO2 100%   BMI 19.42 kg/m      Assessment & Plan:  1. Hb-SS disease 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 ([place])  Will continue folic acid 1 mg daily to prevent aplastic bone marrow crises.  Also, continue Oxycodone 10 mg every 4 hours as previously prescribed for pain management.  - POCT urinalysis dip (device) - COMPLETE METABOLIC PANEL WITH GFR  2. First trimester pregnancy [name] has a follow up appointment scheduled with obstetrician on 12/16/2016 Most recent quanitative HCG was 524, I will order a beta HCG to ensure that its increasing as inspected  - US OB Transvaginal; Future - hCG, quantitative, pregnancy  3. High-risk pregnancy in first trimester Continue prenatal vitamin as previously prescribed  - US OB Transvaginal; Future - hCG, quantitative, pregnancy  4. Hypomagnesemia Previous magnesium level was low normal at 1.8, will repeat.  - Magnesium  5. Hypokalemia Will repeat potassium, previous potassium level was 2.9.  - COMPLETE METABOLIC PANEL WITH GFR    RTC: 1 month for sickle cell anemia and medication management   [name]  MSN, FNP-C [place],  [address] [phone]

## 2016-11-20 ENCOUNTER — Telehealth: Payer: Self-pay | Admitting: Family Medicine

## 2016-11-20 LAB — MAGNESIUM: MAGNESIUM: 2 mg/dL (ref 1.5–2.5)

## 2016-11-20 NOTE — Telephone Encounter (Signed)
[name], a 46 year old female with a history of sickle cell anemia, HbSS presented to clinic on 11/19/2016. Patient is currently in first trimester of pregnancy. Beta HCG 11 days prior was 524, which as increased to grater than 10,000. The firs day of her last menstrual cycle was 10/10/2016, it is estimated that [name] is around 5 weeks 6 days pregnant. I have ordered a transvaginal ultrasound, which is scheduled for 11/25/2016. She has a new OB appt scheduled on 12/16/2016.   Patient will warrant a referral to fetal maternal specialists, due to sickle cell anemia and advanced maternal age  as this pregnancy progresses.   [name] Pounds  MSN, FNP-C [facility] [address] [place], [place] [address] [phone]

## 2016-11-25 ENCOUNTER — Ambulatory Visit (HOSPITAL_COMMUNITY)
Admission: RE | Admit: 2016-11-25 | Discharge: 2016-11-25 | Disposition: A | Discharge status: Home / Self Care | Payer: Medicaid Other | Source: Ambulatory Visit | Attending: Family Medicine | Admitting: Family Medicine

## 2016-11-25 DIAGNOSIS — Z3A09 9 weeks gestation of pregnancy: Secondary | ICD-10-CM | POA: Insufficient documentation

## 2016-11-25 DIAGNOSIS — O0991 Supervision of high risk pregnancy, unspecified, first trimester: Secondary | ICD-10-CM

## 2016-11-25 DIAGNOSIS — Z3491 Encounter for supervision of normal pregnancy, unspecified, first trimester: Secondary | ICD-10-CM

## 2016-11-26 ENCOUNTER — Telehealth: Payer: Self-pay | Admitting: Family Medicine

## 2016-11-26 NOTE — Telephone Encounter (Signed)
[name], a 46 year old female with a history of sickle cell anemia presented on 11/22/2016. She was found to have a positive urine pregnancy during a previous hospital admission. A beta HCG and transvaginal ultrasound was ordered to check viability. A single viable intrauterne pregnancy was visualized consistent with 6 weeks/6 days. An estimated due date is 07/15/2017. She has an appointment scheduled with obstetrician on 12/16/2016.    [name]  MSN, FNP-C [facility] [address] [place], [place] [address] [phone]

## 2016-11-28 ENCOUNTER — Ambulatory Visit: Payer: Medicaid Other | Admitting: Family Medicine

## 2016-11-28 DIAGNOSIS — I83013 Varicose veins of right lower extremity with ulcer of ankle: Secondary | ICD-10-CM | POA: Insufficient documentation

## 2016-12-16 ENCOUNTER — Encounter: Payer: Self-pay | Admitting: Obstetrics and Gynecology

## 2016-12-16 ENCOUNTER — Other Ambulatory Visit (HOSPITAL_COMMUNITY)
Admission: RE | Admit: 2016-12-16 | Discharge: 2016-12-16 | Disposition: A | Discharge status: Home / Self Care | Payer: Medicaid Other | Source: Ambulatory Visit | Attending: Obstetrics and Gynecology | Admitting: Obstetrics and Gynecology

## 2016-12-16 ENCOUNTER — Encounter: Payer: Self-pay | Admitting: Family Medicine

## 2016-12-16 ENCOUNTER — Ambulatory Visit (INDEPENDENT_AMBULATORY_CARE_PROVIDER_SITE_OTHER): Payer: Medicaid Other | Admitting: Obstetrics and Gynecology

## 2016-12-16 VITALS — BP 116/66 | HR 70 | Wt 128.4 lb

## 2016-12-16 DIAGNOSIS — D571 Sickle-cell disease 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: Secondary | ICD-10-CM

## 2016-12-16 DIAGNOSIS — O0990 Supervision of high risk pregnancy, unspecified, unspecified trimester: Secondary | ICD-10-CM | POA: Diagnosis present

## 2016-12-16 DIAGNOSIS — O34219 Maternal care for unspecified type scar from previous cesarean delivery: Secondary | ICD-10-CM | POA: Diagnosis not present

## 2016-12-16 DIAGNOSIS — Z113 Encounter for screening for infections with a predominantly sexual mode of transmission: Secondary | ICD-10-CM | POA: Diagnosis not present

## 2016-12-16 DIAGNOSIS — Z3A09 9 weeks gestation of pregnancy: Secondary | ICD-10-CM | POA: Insufficient documentation

## 2016-12-16 DIAGNOSIS — Z124 Encounter for screening for malignant neoplasm of cervix: Secondary | ICD-10-CM

## 2016-12-16 DIAGNOSIS — O0991 Supervision of high risk pregnancy, unspecified, first trimester: Secondary | ICD-10-CM | POA: Diagnosis present

## 2016-12-16 DIAGNOSIS — Z8759 Personal history of other complications of pregnancy, childbirth and the puerperium: Secondary | ICD-10-CM

## 2016-12-16 DIAGNOSIS — O99011 Anemia complicating pregnancy, first trimester: Secondary | ICD-10-CM | POA: Diagnosis not present

## 2016-12-16 DIAGNOSIS — Z1151 Encounter for screening for human papillomavirus (HPV): Secondary | ICD-10-CM | POA: Diagnosis not present

## 2016-12-16 LAB — POCT URINALYSIS DIP (DEVICE)
Bilirubin Urine: NEGATIVE
Glucose, UA: NEGATIVE mg/dL
Hgb urine dipstick: NEGATIVE
Ketones, ur: NEGATIVE mg/dL
Leukocytes, UA: NEGATIVE
NITRITE: NEGATIVE
PH: 7 (ref 5.0–8.0)
PROTEIN: NEGATIVE mg/dL
Specific Gravity, Urine: 1.01 (ref 1.005–1.030)
UROBILINOGEN UA: 1 mg/dL (ref 0.0–1.0)

## 2016-12-16 NOTE — Patient Instructions (Signed)
First Trimester of Pregnancy The first trimester of pregnancy is from week 1 until the end of week 13 (months 1 through 3). A week after a sperm fertilizes an egg, the egg will implant on the wall of the uterus. This embryo will begin to develop into a baby. Genes from you and your partner will form the baby. The female genes will determine whether the baby will be a boy or a girl. At 6-8 weeks, the eyes and face will be formed, and the heartbeat can be seen on ultrasound. At the end of 12 weeks, all the baby's organs will be formed. Now that you are pregnant, you will want to do everything you can to have a healthy baby. Two of the most important things are to get good prenatal care and to follow your health care provider's instructions. Prenatal care is all the medical care you receive before the baby's birth. This care will help prevent, find, and treat any problems during the pregnancy and childbirth. Body changes during your first trimester Your body goes through many changes during pregnancy. The changes vary from woman to woman.  You may gain or lose a couple of pounds at first.  You may feel sick to your stomach (nauseous) and you may throw up (vomit). If the vomiting is uncontrollable, call your health care provider.  You may tire easily.  You may develop headaches that can be relieved by medicines. All medicines should be approved by your health care provider.  You may urinate more often. Painful urination may mean you have a bladder infection.  You may develop heartburn as a result of your pregnancy.  You may develop constipation because certain hormones are causing the muscles that push stool through your intestines to slow down.  You may develop hemorrhoids or swollen veins (varicose veins).  Your breasts may begin to grow larger and become tender. Your nipples may stick out more, and the tissue that surrounds them (areola) may become darker.  Your gums may bleed and may be  sensitive to brushing and flossing.  Dark spots or blotches (chloasma, mask of pregnancy) may develop on your face. This will likely fade after the baby is born.  Your menstrual periods will stop.  You may have a loss of appetite.  You may develop cravings for certain kinds of food.  You may have changes in your emotions from day to day, such as being excited to be pregnant or being concerned that something may go wrong with the pregnancy and baby.  You may have more vivid and strange dreams.  You may have changes in your hair. These can include thickening of your hair, rapid growth, and changes in texture. Some women also have hair loss during or after pregnancy, or hair that feels dry or thin. Your hair will most likely return to normal after your baby is born.  What to expect at prenatal visits During a routine prenatal visit:  You will be weighed to make sure you and the baby are growing normally.  Your blood pressure will be taken.  Your abdomen will be measured to track your baby's growth.  The fetal heartbeat will be listened to between weeks 10 and 14 of your pregnancy.  Test results from any previous visits will be discussed.  Your health care provider may ask you:  How you are feeling.  If you are feeling the baby move.  If you have had any abnormal symptoms, such as leaking fluid, bleeding, severe headaches,   or abdominal cramping.  If you are using any tobacco products, including cigarettes, chewing tobacco, and electronic cigarettes.  If you have any questions.  Other tests that may be performed during your first trimester include:  Blood tests to find your blood type and to check for the presence of any previous infections. The tests will also be used to check for low iron levels (anemia) and protein on red blood cells (Rh antibodies). Depending on your risk factors, or if you previously had diabetes during pregnancy, you may have tests to check for high blood  sugar that affects pregnant women (gestational diabetes).  Urine tests to check for infections, diabetes, or protein in the urine.  An ultrasound to confirm the proper growth and development of the baby.  Fetal screens for spinal cord problems (spina bifida) and Down syndrome.  HIV (human immunodeficiency virus) testing. Routine prenatal testing includes screening for HIV, unless you choose not to have this test.  You may need other tests to make sure you and the baby are doing well.  Follow these instructions at home: Medicines  Follow your health care provider's instructions regarding medicine use. Specific medicines may be either safe or unsafe to take during pregnancy.  Take a prenatal vitamin that contains at least 600 micrograms (mcg) of folic acid.  If you develop constipation, try taking a stool softener if your health care provider approves. Eating and drinking  Eat a balanced diet that includes fresh fruits and vegetables, whole grains, good sources of protein such as meat, eggs, or tofu, and low-fat dairy. Your health care provider will help you determine the amount of weight gain that is right for you.  Avoid raw meat and uncooked cheese. These carry germs that can cause birth defects in the baby.  Eating four or five small meals rather than three large meals a day may help relieve nausea and vomiting. If you start to feel nauseous, eating a few soda crackers can be helpful. Drinking liquids between meals, instead of during meals, also seems to help ease nausea and vomiting.  Limit foods that are high in fat and processed sugars, such as fried and sweet foods.  To prevent constipation: ? Eat foods that are high in fiber, such as fresh fruits and vegetables, whole grains, and beans. ? Drink enough fluid to keep your urine clear or pale yellow. Activity  Exercise only as directed by your health care provider. Most women can continue their usual exercise routine during  pregnancy. Try to exercise for 30 minutes at least 5 days a week. Exercising will help you: ? Control your weight. ? Stay in shape. ? Be prepared for labor and delivery.  Experiencing pain or cramping in the lower abdomen or lower back is a good sign that you should stop exercising. Check with your health care provider before continuing with normal exercises.  Try to avoid standing for long periods of time. Move your legs often if you must stand in one place for a long time.  Avoid heavy lifting.  Wear low-heeled shoes and practice good posture.  You may continue to have sex unless your health care provider tells you not to. Relieving pain and discomfort  Wear a good support bra to relieve breast tenderness.  Take warm sitz baths to soothe any pain or discomfort caused by hemorrhoids. Use hemorrhoid cream if your health care provider approves.  Rest with your legs elevated if you have leg cramps or low back pain.  If you develop   varicose veins in your legs, wear support hose. Elevate your feet for 15 minutes, 3-4 times a day. Limit salt in your diet. Prenatal care  Schedule your prenatal visits by the twelfth week of pregnancy. They are usually scheduled monthly at first, then more often in the last 2 months before delivery.  Write down your questions. Take them to your prenatal visits.  Keep all your prenatal visits as told by your health care provider. This is important. Safety  Wear your seat belt at all times when driving.  Make a list of emergency phone numbers, including numbers for family, friends, the hospital, and police and fire departments. General instructions  Ask your health care provider for a referral to a local prenatal education class. Begin classes no later than the beginning of month 6 of your pregnancy.  Ask for help if you have counseling or nutritional needs during pregnancy. Your health care provider can offer advice or refer you to specialists for help  with various needs.  Do not use hot tubs, steam rooms, or saunas.  Do not douche or use tampons or scented sanitary pads.  Do not cross your legs for long periods of time.  Avoid cat litter boxes and soil used by cats. These carry germs that can cause birth defects in the baby and possibly loss of the fetus by miscarriage or stillbirth.  Avoid all smoking, herbs, alcohol, and medicines not prescribed by your health care provider. Chemicals in these products affect the formation and growth of the baby.  Do not use any products that contain nicotine or tobacco, such as cigarettes and e-cigarettes. If you need help quitting, ask your health care provider. You may receive counseling support and other resources to help you quit.  Schedule a dentist appointment. At home, brush your teeth with a soft toothbrush and be gentle when you floss. Contact a health care provider if:  You have dizziness.  You have mild pelvic cramps, pelvic pressure, or nagging pain in the abdominal area.  You have persistent nausea, vomiting, or diarrhea.  You have a bad smelling vaginal discharge.  You have pain when you urinate.  You notice increased swelling in your face, hands, legs, or ankles.  You are exposed to fifth disease or chickenpox.  You are exposed to German measles (rubella) and have never had it. Get help right away if:  You have a fever.  You are leaking fluid from your vagina.  You have spotting or bleeding from your vagina.  You have severe abdominal cramping or pain.  You have rapid weight gain or loss.  You vomit blood or material that looks like coffee grounds.  You develop a severe headache.  You have shortness of breath.  You have any kind of trauma, such as from a fall or a car accident. Summary  The first trimester of pregnancy is from week 1 until the end of week 13 (months 1 through 3).  Your body goes through many changes during pregnancy. The changes vary from  woman to woman.  You will have routine prenatal visits. During those visits, your health care provider will examine you, discuss any test results you may have, and talk with you about how you are feeling. This information is not intended to replace advice given to you by your health care provider. Make sure you discuss any questions you have with your health care provider. Document Released: 04/30/2001 Document Revised: 04/17/2016 Document Reviewed: 04/17/2016 Elsevier Interactive Patient Education  2017 [employer]   Inc[employer]  

## 2016-12-17 LAB — OBSTETRIC PANEL, INCLUDING HIV
Antibody Screen: NEGATIVE
BASOS ABS: 0.1 10*3/uL (ref 0.0–0.2)
Basos: 1 %
EOS (ABSOLUTE): 0.6 10*3/uL — AB (ref 0.0–0.4)
Eos: 4 %
HEP B S AG: NEGATIVE
HIV Screen 4th Generation wRfx: NONREACTIVE
Hematocrit: 22.3 % — ABNORMAL LOW (ref 34.0–46.6)
Hemoglobin: 7.3 g/dL — ABNORMAL LOW (ref 11.1–15.9)
IMMATURE GRANS (ABS): 0.1 10*3/uL (ref 0.0–0.1)
IMMATURE GRANULOCYTES: 1 %
LYMPHS: 17 %
Lymphocytes Absolute: 2.5 10*3/uL (ref 0.7–3.1)
MCH: 27.2 pg (ref 26.6–33.0)
MCHC: 32.7 g/dL (ref 31.5–35.7)
MCV: 83 fL (ref 79–97)
MONOCYTES: 14 %
Monocytes Absolute: 2 10*3/uL — ABNORMAL HIGH (ref 0.1–0.9)
NRBC: 5 % — AB (ref 0–0)
Neutrophils Absolute: 9.2 10*3/uL — ABNORMAL HIGH (ref 1.4–7.0)
Neutrophils: 63 %
PLATELETS: 553 10*3/uL — AB (ref 150–379)
RBC: 2.68 x10E6/uL — AB (ref 3.77–5.28)
RDW: 22.8 % — AB (ref 12.3–15.4)
RPR Ser Ql: NONREACTIVE
Rh Factor: POSITIVE
Rubella Antibodies, IGG: 24.1 index (ref >0.99)
WBC: 14.5 10*3/uL — AB (ref 3.4–10.8)

## 2016-12-17 LAB — COMPREHENSIVE METABOLIC PANEL
A/G RATIO: 1.7 (ref 1.2–2.2)
ALT: 20 IU/L (ref 0–32)
AST: 38 IU/L (ref 0–40)
Albumin: 4.5 g/dL (ref 3.5–5.5)
Alkaline Phosphatase: 67 IU/L (ref 39–117)
BILIRUBIN TOTAL: 3.2 mg/dL — AB (ref 0.0–1.2)
BUN/Creatinine Ratio: 20 (ref 9–23)
BUN: 6 mg/dL (ref 6–24)
CHLORIDE: 101 mmol/L (ref 96–106)
CO2: 20 mmol/L (ref 20–29)
Calcium: 9.6 mg/dL (ref 8.7–10.2)
Creatinine, Ser: 0.3 mg/dL — ABNORMAL LOW (ref 0.57–1.00)
GFR calc non Af Amer: 139 mL/min/{1.73_m2} (ref >59)
GFR, EST AFRICAN AMERICAN: 160 mL/min/{1.73_m2} (ref >59)
Globulin, Total: 2.7 g/dL (ref 1.5–4.5)
Glucose: 79 mg/dL (ref 65–99)
POTASSIUM: 3.4 mmol/L — AB (ref 3.5–5.2)
Sodium: 135 mmol/L (ref 134–144)
TOTAL PROTEIN: 7.2 g/dL (ref 6.0–8.5)

## 2016-12-17 LAB — PROTEIN / CREATININE RATIO, URINE
CREATININE, UR: 27.6 mg/dL
PROTEIN UR: 7.4 mg/dL
PROTEIN/CREAT RATIO: 268 mg/g{creat} — AB (ref 0–200)

## 2016-12-17 NOTE — Progress Notes (Signed)
Subjective:  [name] is a 46 y.o. [id number] at 80w5dbeing seen today for first OB visit.IUP and EDD confirmed by first trimester U/S.  Pt has several medical problems. Sickle cell Disease ( Was hospitalized in June for pain crisis and acute chest pain syndrome) [facility] this [facility]. She also has lower ext non pressure wound related to her Sickle cell. She is followed by the wound center weekly for this. Reports that it is improving. She also has a H/O PEC and c section and she is AMA.  She is currently monitored for the following issues for this high-risk pregnancy and has Cholelithiasis; Leukocytosis; Chronic wound of extremity; Sinus bradycardia by electrocardiogram; Chronic pain syndrome; Hemochromatosis; Previous cesarean delivery, antepartum condition or complication; [place]SS disease 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 ([place]; Hb-SS disease with crisis ([place]; [place]ickle cell anemia ([place]; Supervision of high risk pregnancy, antepartum; and History of pre-eclampsia on her problem list.  Patient reports no complaints.  Contractions: Not present. Vag. Bleeding: None.  Movement: Absent. Denies leaking of fluid.   The following portions of the patient's history were reviewed and updated as appropriate: allergies, current medications, past family history, past medical history, past social history, past surgical history and problem list. Problem list updated.  Objective:   Vitals:   12/16/16 1512  BP: 116/66  Pulse: 70  Weight: 128 lb 6.4 oz (58.2 kg)    Fetal Status: Fetal Heart Rate (bpm): 161   Movement: Absent     General:  Alert, oriented and cooperative. Patient is in no acute distress.  Skin: Skin is warm and dry. No rash noted.   Cardiovascular: Normal heart rate noted  Respiratory: Normal respiratory effort, no problems with respiration noted  Abdomen: Soft, gravid, appropriate for gestational age. Pain/Pressure: Present     Pelvic:  Cervical exam performed         Extremities: Normal range of motion.  Edema: None  Mental Status: Normal mood and affect. Normal behavior. Normal judgment and thought content.   Urinalysis: Urine Protein: Negative Urine Glucose: Negative  Assessment and Plan:  Pregnancy: [id number] 959w5d1. Supervision of high risk pregnancy, antepartum Prenatal care and labs reviewed with pt  - POCT urinalysis dip (device) - AMB MFM GENETICS REFERRAL - Obstetric Panel, Including HIV - Culture, OB Urine - Cytology - PAP - Protein / Creatinine Ratio, Urine - Comp Met (CMET)  2. Hb-SS disease 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 ([place] [facility] [facility] clinic for management - AMB MFM GENETICS REFERRAL - Obstetric Panel, Including HIV - Culture, OB Urine - Cytology - PAP - Protein / Creatinine Ratio, Urine - Comp Met (CMET)  3. Previous cesarean delivery, antepartum condition or complication Will need repeat at 39 weeks  - AMB MFM GENETICS REFERRAL - Obstetric Panel, Including HIV - Culture, OB Urine - Cytology - PAP - Protein / Creatinine Ratio, Urine - Comp Met (CMET)  4. History of pre-eclampsia Will start BASA after first trimester Monitor BP - AMB MFM GENETICS REFERRAL - Obstetric Panel, Including HIV - Culture, OB Urine - Cytology - PAP - Protein / Creatinine Ratio, Urine - Comp Met (CMET)  AMA  Refer to MFM and Genetics for counseling and testing   Preterm labor symptoms and general obstetric precautions including but not limited to vaginal bleeding, contractions, leaking of fluid and fetal movement were reviewed in detail with the patient. Please refer to After Visit Summary for other counseling recommendations.  Return in about  4 weeks (around 01/13/2017) for OB visit.   [name], MD

## 2016-12-18 LAB — CYTOLOGY - PAP
Chlamydia: NEGATIVE
Diagnosis: NEGATIVE
HPV: NOT DETECTED
NEISSERIA GONORRHEA: NEGATIVE

## 2016-12-19 ENCOUNTER — Ambulatory Visit (HOSPITAL_COMMUNITY)
Admission: RE | Admit: 2016-12-19 | Discharge: 2016-12-19 | Disposition: A | Discharge status: Home / Self Care | Payer: Medicaid Other | Source: Ambulatory Visit | Attending: *Deleted | Admitting: *Deleted

## 2016-12-19 DIAGNOSIS — Z3A10 10 weeks gestation of pregnancy: Secondary | ICD-10-CM | POA: Insufficient documentation

## 2016-12-19 DIAGNOSIS — D571 Sickle-cell disease 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: Secondary | ICD-10-CM

## 2016-12-19 DIAGNOSIS — O09529 Supervision of elderly multigravida, unspecified trimester: Secondary | ICD-10-CM

## 2016-12-19 NOTE — Progress Notes (Signed)
Genetic Counseling  High-Risk Gestation Note  Appointment Date:  12/19/2016 Referred By: [name], MD Date of Birth:  20-Jul-1970 Partner: [name]   Pregnancy History: [id number] Estimated Date of Delivery: 07/17/17 Estimated Gestational Age: [redacted]w[redacted]d Attending: Benjaman Lobe, MD   [name] was seen for genetic counseling because of a maternal age of 46 y.o. and personal history of sickle cell disease (Hb SS disease). She has had genetic counseling in previous pregnancies regarding both of these indications. See previous genetic counseling note from 07/28/2013.   The patient's two children were present at today's visit.   In summary:  Discussed AMA and associated risk for fetal aneuploidy  Discussed options for screening  First screen- declined  Quad screen- declined  NIPS- interested in pursuing at a later date; declined blood draw today  Ultrasound- patient interested in detailed ultrasound in second trimester   Discussed diagnostic testing options   [facility]- declined  Amniocentesis- declined  Reviewed personal and family history of sickle cell disease  Couple's recent child diagnosed postnatally with Hb SS disease (sickle cell disease)  Reviewed autosomal recessive inheritance and that father of pregnancy is obligate carrier given this report  Recurrence risk for sickle cell disease (Hb SS) in current pregnancy is 50% (1 in 2)  Patient declined prenatal diagnosis for sickle cell disease  Reviewed that MFM consult is available, if desired by her OB provider regarding management of sickle cell disease in pregnancy  Patient is followed by S[facility] Cell center at [facility]   She was counseled regarding maternal age and the association with risk for chromosome conditions due to nondisjunction with aging of the ova.   We reviewed chromosomes, nondisjunction, and the associated 1 in 8 risk for fetal aneuploidy related to a maternal age of 46 y.o.  at [redacted]w[redacted]d gestation.  She was counseled that the risk for aneuploidy decreases as gestational age increases, accounting for those pregnancies which spontaneously abort.  We specifically discussed Down syndrome (trisomy 20), trisomies 4 and 43, and sex chromosome aneuploidies (47,XXX and 47,XXY) including the common features and prognoses of each.   We reviewed available screening options including First Screen, Quad screen, noninvasive prenatal screening (NIPS)/cell free DNA (cfDNA) screening, and detailed ultrasound.  She was counseled that screening tests are used to modify a patient's a priori risk for aneuploidy, typically based on age. This estimate provides a pregnancy specific risk assessment. We reviewed the benefits and limitations of each option. Specifically, we discussed the conditions for which each test screens, the detection rates, and false positive rates of each. She was also counseled regarding diagnostic testing via [facility] and amniocentesis. We reviewed the approximate 1 in 628-315 risk for complications from amniocentesis, including spontaneous pregnancy loss. We discussed the possible results that the tests might provide including: positive, negative, unanticipated, and no result. Finally, they were counseled regarding the cost of each option and potential out of pocket expenses.   After consideration of all the options, she expressed interest in pursuing NIPS, but declined blood draw today. She is aware that she can call our office to facilitate a blood draw, if desired. [name] indicated that she would possibly wait to pursue this at time of her detailed ultrasound in the second trimester, which has not yet been scheduled. She declined first trimester screening and Quad screening. She declined diagnostic, invasive testing for fetal aneuploidy.    She understands that screening tests cannot rule out all birth defects or genetic syndromes. The patient  was advised of this limitation and  states she still does not want additional testing at this time.   Both family histories were reviewed and updated from her visit in 2015. The couple's son, [name], was diagnosed postnatally in 2015 with sickle cell disease (Hb SS disease). [name] reported that the father of the pregnancy has not had screening for sickle cell trait, but she understands that the diagnosis of Hb SS disease in their son indicates that the father of the pregnancy is an obligate carrier for sickle cell trait. The couple's daughter has sickle cell trait. [name] is followed by the sickle cell center at [facility]. Her son is followed through pediatric hematology at [facility]. She reported that her son has been doing well and has not had crises.   We reviewed that autosomal recessive inheritance of sickle cell disease (Hb SS), and that each of [name] offspring are expected to inherit Hb S from her, meaning they are all at least carriers. Given the assumed carrier status for the father of the pregnancy, each pregnancy for the couple together has a 1 in 2 (50%) chance to inherit sickle cell disease (Hb SS disease) and a 1 in 2 (50%) chance to inherit sickle cell trait (Hb AS). [name] indicated that she is familiar with these chances. We do not have laboratory confirmation of carrier status of the father of the pregnancy. Additional information may alter recurrence risk assessment. We discussed that prenatal diagnosis for sickle cell disease is available in the current pregnancy via CVS or amniocentesis. [name] declined prenatal diagnosis for Hb SS disease via [facility]S and amniocentesis. She prefers to await postnatal evaluation for sickle cell disease for the baby. She understands that ultrasound in pregnancy is not expected to be informative regarding the sickle cell status of the pregnancy. See previous genetic counseling note from 07/28/13 for detailed hemoglobinopathy  discussion. The family histories were  found to be noncontributory for updates regarding birth defects, intellectual disability, and known genetic conditions. Consanguinity was denied. Without further information regarding the provided family history, an accurate genetic risk cannot be calculated. Further genetic counseling is warranted if more information is obtained.  The father of the pregnancy is 70 years old. Advanced paternal age (APA) is defined as paternal age greater than or equal to age 37. This was also discussed during [name] pregnancy in 2015.  Recent large-scale sequencing studies have shown that approximately 80% of de novo point mutations are of paternal origin.  Many studies have demonstrated a strong correlation between increased paternal age and de novo point mutations.  Although no specific data is available regarding fetal risks for fathers 61+ years old at conception, it is apparent that the overall risk for single gene conditions is increased.  To estimate the relative increase in risk of a genetic disorder with APA, the heritability of the disease must be considered.  Assuming an approximate 2x increase in risk for conditions that are exclusively paternal in origin, the risk for each individual condition is still relatively low.  It is estimated that the overall chance for a de novo mutation is ~0.5%.  We also discussed the wide range of conditions which can be caused by new dominant gene mutations (achondroplasia, neurofibromatosis, Marfan syndrome etc.).      Diagnostic testing for each individual single gene condition is not warranted or available unless ultrasound or concerns lend suspicion to a specific condition. However, there is another NIPS  platform ([facility] through [place]) that is able to assess for specific mutations in a panel of 30 selected genes covering 26 conditions. Most of these conditions follow an autosomal dominant pattern of inheritance and typically occur  due to de novo gene mutations. The detection rates for these conditions vary depending upon the specific condition but range from 43% to 96%. Therefore, this screening would not identify all new dominant gene mutations. [name] declined additional screening with [facility] at this time.  In addition, we discussed the recommendation for a detailed ultrasound at 18+ weeks gestation and a follow up ultrasound at ~28 weeks to monitor fetal growth.  [name] denied exposure to environmental toxins or chemical agents. She denied the use of alcohol, tobacco or street drugs. She denied significant viral illnesses during the course of her pregnancy. Her medical and surgical histories were contributory for sickle cell disease, as previously discussed.   I counseled [name] regarding the above risks and available options.  The approximate face-to-face time with the genetic counselor was 30 minutes.  Chipper Oman, MS,  Certified Genetic Counselor 12/19/2016

## 2016-12-20 ENCOUNTER — Encounter: Payer: Self-pay | Admitting: Family Medicine

## 2016-12-20 ENCOUNTER — Ambulatory Visit (INDEPENDENT_AMBULATORY_CARE_PROVIDER_SITE_OTHER): Payer: Medicaid Other | Admitting: Family Medicine

## 2016-12-20 VITALS — BP 120/73 | HR 92 | Temp 98.3°F | Resp 16 | Ht 67.0 in | Wt 128.0 lb

## 2016-12-20 DIAGNOSIS — O09529 Supervision of elderly multigravida, unspecified trimester: Secondary | ICD-10-CM

## 2016-12-20 DIAGNOSIS — D571 Sickle-cell disease 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: Secondary | ICD-10-CM

## 2016-12-20 DIAGNOSIS — G894 Chronic pain syndrome: Secondary | ICD-10-CM

## 2016-12-20 DIAGNOSIS — B354 Tinea corporis: Secondary | ICD-10-CM

## 2016-12-20 LAB — POCT URINALYSIS DIP (DEVICE)
BILIRUBIN URINE: NEGATIVE
Glucose, UA: NEGATIVE mg/dL
HGB URINE DIPSTICK: NEGATIVE
KETONES UR: NEGATIVE mg/dL
Leukocytes, UA: NEGATIVE
Nitrite: NEGATIVE
PH: 7 (ref 5.0–8.0)
PROTEIN: NEGATIVE mg/dL
SPECIFIC GRAVITY, URINE: 1.01 (ref 1.005–1.030)
Urobilinogen, UA: 1 mg/dL (ref 0.0–1.0)

## 2016-12-20 LAB — CBC WITH DIFFERENTIAL/PLATELET
BASOS PCT: 1 %
Basophils Absolute: 161 cells/uL (ref 0–200)
EOS PCT: 3 %
Eosinophils Absolute: 483 cells/uL (ref 15–500)
HCT: 23.9 % — ABNORMAL LOW (ref 35.0–45.0)
Hemoglobin: 8 g/dL — ABNORMAL LOW (ref 11.7–15.5)
Lymphocytes Relative: 14 %
Lymphs Abs: 2254 cells/uL (ref 850–3900)
MCH: 29.1 pg (ref 27.0–33.0)
MCHC: 33.5 g/dL (ref 32.0–36.0)
MCV: 86.9 fL (ref 80.0–100.0)
MONOS PCT: 12 %
MPV: 8.8 fL (ref 7.5–12.5)
Monocytes Absolute: 1932 cells/uL — ABNORMAL HIGH (ref 200–950)
NEUTROS ABS: 11270 {cells}/uL — AB (ref 1500–7800)
Neutrophils Relative %: 70 %
PLATELETS: 444 10*3/uL — AB (ref 140–400)
RBC: 2.75 MIL/uL — AB (ref 3.80–5.10)
RDW: 21.7 % — AB (ref 11.0–15.0)
WBC: 16.1 10*3/uL — AB (ref 3.8–10.8)

## 2016-12-20 MED ORDER — CLOBETASOL PROP EMOLLIENT BASE 0.05 % EX CREA: 1.0000 "application " | TOPICAL_CREAM | Freq: Two times a day (BID) | CUTANEOUS | 1 refills | Status: DC

## 2016-12-20 MED ORDER — OXYCODONE HCL 10 MG PO TABS: 10.0000 mg | ORAL_TABLET | ORAL | 0 refills | Status: DC | PRN

## 2016-12-20 MED FILL — ENDARI 5 GM PKG: 5 | 30 days supply | Qty: 60 | Fill #2

## 2016-12-20 MED FILL — oxyCODONE HCL 10 MG TABS: 10 | 15 days supply | Qty: 90 | Fill #0

## 2016-12-20 MED FILL — CLOBETASOL EMOLLIENT 0.05%: 0.05 | 20 days supply | Qty: 30 | Fill #0

## 2016-12-20 NOTE — Progress Notes (Signed)
Subjective:    Patient ID: [name], female    DOB: 07-May-1971, 46 y.o.   MRN: [id number]  HPI [name], a 46 year old female with a history of sickle cell anemia, HbSS and chronic leg ulcers  presents for a 1 month follow up. [name] is currently in first trimester of pregnancy. . She says that she generally feels well and has minimal complaints.  She is followed by fetal maternal medicine due to the high risk nature of pregnancy.  She had initial prenatal appointment with Dr. Arlina Robes on 12/16/2016. She also had appointment for genetic counseling. She is taking prenatal vitamins and folic acid consistently. She says that pain is controlled on Oxycodone 10 mg. She last had pain medication on yesterday with maximum relief. Pain is primarily to right lower extremity. She has chronic ulcers to right lower extremity and is followed by wound care weekly. She says that wounds are improving.  She currently denies headache, chest pain, shortness of breath, nausea, vomiting diarrhea, or constipation.   Past Medical History:  Diagnosis Date  . Anemia 03/30/2012   Hx of sickle cell disease  . Anemia complicating pregnancy 7/89/3810  . CAP (community acquired pneumonia)    2014  . Cholelithiasis y-1  . Cholelithiasis   . Elevated LFTs   . Leg ulcer ([place]) 10/27/2012   Chronic under care of wound clinic  . Multiple open wounds of lower extremity    chronic wounds B/LLE  . Sickle cell disease ([place])    Immunization History  Administered Date(s) Administered  . Influenza,inj,Quad PF,36+ Mos 03/16/2013, 01/20/2014, 03/01/2015, 02/19/2016  . Pneumococcal Conjugate-13 12/14/2015  . Pneumococcal Polysaccharide-23 03/24/2014  . Tdap 11/25/2013   Social History   Social History  . Marital status: Married    Spouse name: [name]  . Number of children: 1  . Years of education: N/A   Occupational History  . Employed in home care.    Social History Main Topics  . Smoking  status: Never Smoker  . Smokeless tobacco: Never Used  . Alcohol use No  . Drug use: No  . Sexual activity: Yes    Birth control/ protection: None   Other Topics Concern  . Not on file   Social History Narrative   Lives with husband.  No Known Allergies  Review of Systems  Constitutional: Negative for fever.  HENT: Negative.   Eyes: Negative for visual disturbance.  Respiratory: Negative.   Cardiovascular: Negative.   Gastrointestinal: Negative.   Endocrine: Negative.  Negative for polydipsia, polyphagia and polyuria.  Musculoskeletal: Positive for myalgias.       Pain to right lower extremity  Skin: Negative.        Right lower extremity ulcers  Neurological: Negative.   Hematological: Negative.   Psychiatric/Behavioral: Negative.        Objective:   Physical Exam  Constitutional: She is oriented to person, place, and time. She appears well-nourished.  HENT:  Head: Normocephalic and atraumatic.  Right Ear: External ear normal.  Left Ear: External ear normal.  Nose: Nose normal.  Mouth/Throat: Oropharynx is clear and moist.  Eyes: Pupils are equal, round, and reactive to light. Conjunctivae and EOM are normal. Scleral icterus is present.  Neck: Normal range of motion. Neck supple.  Cardiovascular: Normal rate, normal heart sounds and intact distal pulses.   Pulmonary/Chest: Effort normal and breath sounds normal.  Abdominal: Soft. Bowel sounds are normal.  Musculoskeletal: Normal range of motion.  Neurological: She is  alert and oriented to person, place, and time. She has normal reflexes.  Skin: Skin is warm and dry. Rash noted.     Bilateral lower extremity ulcers, unable to examine due to dressings.      Left upper back: Erythematous, scaly, concentric circles.   Psychiatric: She has a normal mood and affect. Her behavior is normal. Judgment and thought content normal.     BP 120/73 (BP Location: Left Arm, Patient Position: Sitting, Cuff Size: Normal)    Pulse 92   Temp 98.3 F (36.8 C) (Oral)   Resp 16   Ht 5\' 7"  (1.702 m)   Wt 128 lb (58.1 kg)   LMP 10/10/2016 Comment: patient double shielded  SpO2 98%   BMI 20.05 kg/m      Assessment & Plan:  1. Hb-SS disease 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 ([place]) Continue folic acid 1 mg daily to prevent aplastic bone marrow crises.   Pulmonary evaluation - Patient denies severe recurrent wheezes, shortness of breath with exercise, or persistent cough. If these symptoms develop, pulmonary function tests with spirometry will be ordered, and if abnormal, plan on referral to Pulmonology for further evaluation.  Cardiac - Routine screening for pulmonary hypertension is not recommended.   Eye - High risk of proliferative retinopathy. Annual eye exam with retinal exam recommended to patient.  Immunization status - Up to date with vaccinations  Acute and chronic painful episodes - We agreed on pain management regimen.  We discussed that pt is to receive her Schedule II prescriptions only from [place]. Pt is also aware that the prescription history is available to [place] online through the [facility] CSRS. Controlled substance agreement signed previously. We reminded [name] that all patients receiving Schedule II narcotics must be seen for follow within one month of prescription being requested. We reviewed the terms of our pain agreement, including the need to keep medicines in a safe locked location away from children or pets, and the need to report excess sedation or constipation, measures to avoid constipation, and policies related to early refills and stolen prescriptions. According to the [place] [facility] program, we have reviewed details related to analgesia, adverse effects, aberrant behaviors.  Reviewed [place] Substance Reporting system prior to prescribing opiate medications. No inconsistencies noted.   - CBC with Differential - Oxycodone HCl 10 MG TABS; Take 1 tablet (10 mg total) by mouth every 4 (four) hours as needed  (pain).  Dispense: 90 tablet; Refill: 0 - COMPLETE METABOLIC PANEL WITH GFR - Hemoglobinopathy Evaluation - POCT urinalysis dip (device)  2. Chronic pain syndrome Reviewed [place] Substance Reporting system prior to prescribing opiate medications. No inconsistencies noted.   - Oxycodone HCl 10 MG TABS; Take 1 tablet (10 mg total) by mouth every 4 (four) hours as needed (pain).  Dispense: 90 tablet; Refill: 0  3. Tinea corporis - Clobetasol Prop Emollient Base (CLOBETASOL PROPIONATE E) 0.05 % emollient cream; Apply 1 application topically 2 (two) times daily.  Dispense: 30 g; Refill: 1  4. Antepartum multigravida of advanced maternal age Reviewed urinalysis, no proteinuria present.  We follow a policy in which most patients with SCD (hemoglobin SS) receive transfusion therapy in the second and third trimester.  In high-risk patients with chronic organ dysfunction or significant history of acute chest syndrome and painful events, we initiate transfusion early in pregnancy. In patients with mild hemoglobin variants, or a benign clinical history, prophylactic transfusions are not utilized. In chronically ill patients with high baseline hemoglobin levels, exchange transfusions are indicated  to maintain the hemoglobin A level greater than 30 to 50 percent. Will work closely with maternal, fetal medicine during this high risk pregnancy.    - Hemoglobinopathy Evaluation - POCT urinalysis dip (device)   RTC: 1 month for sickle cell anemia and medication management   [name]  MSN, FNP-C [place], [place] [phone]

## 2016-12-20 NOTE — Patient Instructions (Addendum)
Sickle cell anemia: Follow up in 1 month We follow a policy in which most patients with SCD (hemoglobin SS) receive transfusion therapy in the third trimester.  In high-risk patients with chronic organ dysfunction or significant history of acute chest syndrome and painful events, we initiate transfusion early in pregnancy. In patients with mild hemoglobin variants, or a benign clinical history, prophylactic transfusions are not utilized. In chronically ill patients with high baseline hemoglobin levels, exchange transfusions are indicated to maintain the hemoglobin A level greater than 30 to 50 percent   Tinea Corporis-Apply Clobetasol 0.05% to left shoulder twice daily for 2 weeks.  Body Ringworm Body ringworm is an infection of the skin that often causes a ring-shaped rash. Body ringworm can affect any part of your skin. It can spread easily to others. Body ringworm is also called tinea corporis. What are the causes? This condition is caused by funguses called dermatophytes. The condition develops when these funguses grow out of control on the skin. You can get this condition if you touch a person or animal that has it. You can also get it if you share clothing, bedding, towels, or any other object with an infected person or pet. What increases the risk? This condition is more likely to develop in:  Athletes who often make skin-to-skin contact with other athletes, such as wrestlers.  People who share equipment and mats.  People with a weakened immune system.  What are the signs or symptoms? Symptoms of this condition include:  Itchy, raised red spots and bumps.  Red scaly patches.  A ring-shaped rash. The rash may have: ? A clear center. ? Scales or red bumps at its center. ? Redness near its borders. ? Dry and scaly skin on or around it.  How is this diagnosed? This condition can usually be diagnosed with a skin exam. A skin scraping may be taken from the affected area and examined  under a microscope to see if the fungus is present. How is this treated? This condition may be treated with:  An antifungal cream or ointment.  An antifungal shampoo.  Antifungal medicines. These may be prescribed if your ringworm is severe, keeps coming back, or lasts a long time.  Follow these instructions at home:  Take over-the-counter and prescription medicines only as told by your health care provider.  If you were given an antifungal cream or ointment: ? Use it as told by your health care provider. ? Wash the infected area and dry it completely before applying the cream or ointment.  If you were given an antifungal shampoo: ? Use it as told by your health care provider. ? Leave the shampoo on your body for 3-5 minutes before rinsing.  While you have a rash: ? Wear loose clothing to stop clothes from rubbing and irritating it. ? Wash or change your bed sheets every night.  If your pet has the same infection, take your pet to see a Animal nutritionist. How is this prevented?  Practice good hygiene.  Wear sandals or shoes in public places and showers.  Do not share personal items with others.  Avoid touching red patches of skin on other people.  Avoid touching pets that have bald spots.  If you touch an animal that has a bald spot, wash your hands. Contact a health care provider if:  Your rash continues to spread after 7 days of treatment.  Your rash is not gone in 4 weeks.  The area around your rash gets red, warm,  tender, and swollen. This information is not intended to replace advice given to you by your health care provider. Make sure you discuss any questions you have with your health care provider. Document Released: 05/03/2000 Document Revised: 10/12/2015 Document Reviewed: 03/02/2015 Elsevier Interactive Patient Education  [employer].

## 2016-12-21 LAB — COMPLETE METABOLIC PANEL WITH GFR
ALT: 18 U/L (ref 6–29)
AST: 31 U/L (ref 10–35)
Albumin: 4.3 g/dL (ref 3.6–5.1)
Alkaline Phosphatase: 58 U/L (ref 33–115)
BILIRUBIN TOTAL: 3.1 mg/dL — AB (ref 0.2–1.2)
BUN: 5 mg/dL — ABNORMAL LOW (ref 7–25)
CHLORIDE: 103 mmol/L (ref 98–110)
CO2: 18 mmol/L — AB (ref 20–31)
CREATININE: 0.3 mg/dL — AB (ref 0.50–1.10)
Calcium: 9.7 mg/dL (ref 8.6–10.2)
GFR, Est African American: >89 mL/min (ref >=60)
GFR, Est Non African American: >89 mL/min (ref >=60)
Glucose, Bld: 95 mg/dL (ref 65–99)
Potassium: 3.4 mmol/L — ABNORMAL LOW (ref 3.5–5.3)
SODIUM: 137 mmol/L (ref 135–146)
TOTAL PROTEIN: 7.4 g/dL (ref 6.1–8.1)

## 2016-12-21 LAB — URINE CULTURE, OB REFLEX

## 2016-12-21 LAB — CULTURE, OB URINE

## 2016-12-23 ENCOUNTER — Encounter (HOSPITAL_COMMUNITY): Payer: Self-pay | Admitting: *Deleted

## 2016-12-23 ENCOUNTER — Inpatient Hospital Stay (HOSPITAL_COMMUNITY)
Admission: AD | Admit: 2016-12-23 | Discharge: 2016-12-23 | Disposition: A | Discharge status: Home / Self Care | Payer: Medicaid Other | Source: Ambulatory Visit | Attending: Obstetrics & Gynecology | Admitting: Obstetrics & Gynecology

## 2016-12-23 ENCOUNTER — Inpatient Hospital Stay (HOSPITAL_COMMUNITY): Payer: Medicaid Other

## 2016-12-23 ENCOUNTER — Other Ambulatory Visit: Payer: Self-pay | Admitting: Family Medicine

## 2016-12-23 DIAGNOSIS — D571 Sickle-cell disease 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: Secondary | ICD-10-CM

## 2016-12-23 DIAGNOSIS — O26891 Other specified pregnancy related conditions, first trimester: Secondary | ICD-10-CM

## 2016-12-23 DIAGNOSIS — O418X10 Other specified disorders of amniotic fluid and membranes, first trimester, not applicable or unspecified: Secondary | ICD-10-CM | POA: Diagnosis not present

## 2016-12-23 DIAGNOSIS — O418X20 Other specified disorders of amniotic fluid and membranes, second trimester, not applicable or unspecified: Secondary | ICD-10-CM | POA: Diagnosis present

## 2016-12-23 DIAGNOSIS — O09529 Supervision of elderly multigravida, unspecified trimester: Secondary | ICD-10-CM

## 2016-12-23 DIAGNOSIS — Z3A10 10 weeks gestation of pregnancy: Secondary | ICD-10-CM | POA: Insufficient documentation

## 2016-12-23 DIAGNOSIS — R109 Unspecified abdominal pain: Secondary | ICD-10-CM

## 2016-12-23 DIAGNOSIS — O4691 Antepartum hemorrhage, unspecified, first trimester: Secondary | ICD-10-CM | POA: Diagnosis present

## 2016-12-23 DIAGNOSIS — O468X1 Other antepartum hemorrhage, first trimester: Secondary | ICD-10-CM | POA: Diagnosis not present

## 2016-12-23 DIAGNOSIS — O468X2 Other antepartum hemorrhage, second trimester: Secondary | ICD-10-CM

## 2016-12-23 DIAGNOSIS — R879 Unspecified abnormal finding in specimens from female genital organs: Secondary | ICD-10-CM

## 2016-12-23 LAB — WET PREP, GENITAL
CLUE CELLS WET PREP: NONE SEEN
Sperm: NONE SEEN
Trich, Wet Prep: NONE SEEN
Yeast Wet Prep HPF POC: NONE SEEN

## 2016-12-23 LAB — URINALYSIS, ROUTINE W REFLEX MICROSCOPIC
BACTERIA UA: NONE SEEN
BILIRUBIN URINE: NEGATIVE
Glucose, UA: NEGATIVE mg/dL
Ketones, ur: NEGATIVE mg/dL
LEUKOCYTES UA: NEGATIVE
NITRITE: NEGATIVE
PH: 6 (ref 5.0–8.0)
PROTEIN: 30 mg/dL — AB
SPECIFIC GRAVITY, URINE: 1.011 (ref 1.005–1.030)

## 2016-12-23 NOTE — MAU Provider Note (Signed)
History     CSN: [id number]  Arrival date and time: 12/23/16 2001   First Provider Initiated Contact with Patient 12/23/16 2032      Chief Complaint  Patient presents with  . Vaginal Bleeding   HPI  [name] is a 46 yo [phone] at 10.[redacted] wks gestation presenting to MAU with complaints of a watery, bloody fluid running down legs while in the store this evening about 7pm.  She reports some lower abd and back pain "like she had to have a BM" before the watery, bloody fluid leaked out.  Past Medical History:  Diagnosis Date  . Anemia 03/30/2012   Hx of sickle cell disease  . Anemia complicating pregnancy 1/51/7616  . CAP (community acquired pneumonia)    2014  . Cholelithiasis y-1  . Cholelithiasis   . Elevated LFTs   . Leg ulcer ([place]) 10/27/2012   Chronic under care of wound clinic  . Multiple open wounds of lower extremity    chronic wounds B/LLE  . Sickle cell disease ([place])     Past Surgical History:  Procedure Laterality Date  . ALLOGRAFT APPLICATION Right 0/73/7106   Procedure: SURGICAL PREP FOR GRAFTING RIGHT LOWER EXTREMITY AND APPLICATION OF Jannifer Hick;  Surgeon: Irene Limbo, MD;  Location: [facility];  Service: Plastics;  Laterality: Right;  . APPLICATION OF A-CELL OF EXTREMITY Right 10/09/2015   Procedure: APPLICATION OF Jannifer Hick;  Surgeon: Irene Limbo, MD;  Location: [facility];  Service: Plastics;  Laterality: Right;  . BREAST SURGERY     left breast cyst aspiration  . CESAREAN SECTION    . CESAREAN SECTION N/A 01/06/2014   Procedure: CESAREAN SECTION;  Surgeon: Emily Filbert, MD;  Location: [facility];  Service: Obstetrics;  Laterality: N/A;  . I&D EXTREMITY Right 10/09/2015   Procedure: SURGICAL PREPARATION FOR GRAFTING RIGHT ANKLE AND APPLICATION THERASKIN;  Surgeon: Irene Limbo, MD;  Location: [facility];  Service: Plastics;  Laterality: Right;  . SKIN FULL THICKNESS GRAFT Bilateral 06/17/2016    Procedure: SURGICAL PREP FOR GRAFTING, BILATERAL LOWER EXTREMITIES AND APPLICATION OF Jannifer Hick;  Surgeon: Irene Limbo, MD;  Location: [facility];  Service: Plastics;  Laterality: Bilateral;  . TONSILLECTOMY      Family History  Problem Relation Age of Onset  . Sickle cell anemia Sister   . Diabetes Mother   . Asthma Mother   . Sickle cell trait Mother   . Sickle cell trait Father   . Hypertension Father     Social History  Substance Use Topics  . Smoking status: Never Smoker  . Smokeless tobacco: Never Used  . Alcohol use No    Allergies: No Known Allergies  Prescriptions Prior to Admission  Medication Sig Dispense Refill Last Dose  . Clobetasol Prop Emollient Base (CLOBETASOL PROPIONATE E) 0.05 % emollient cream Apply 1 application topically 2 (two) times daily. 30 g 1 12/22/2016 at Unknown time  . folic acid (FOLVITE) 1 MG tablet Take 1 tablet (1 mg total) by mouth daily. 30 tablet 11 12/23/2016 at Unknown time  . Oxycodone HCl 10 MG TABS Take 1 tablet (10 mg total) by mouth every 4 (four) hours as needed (pain). 90 tablet 0 12/23/2016 at Unknown time  . Prenatal Multivit-Min-Fe-FA (PRENATAL VITAMINS) 0.8 MG tablet Take 1 tablet by mouth daily. 30 tablet 1 12/23/2016 at Unknown time  . RESTA SILVER GEL Apply topically daily.   12/23/2016 at Unknown time  . Cholecalciferol (VITAMIN D) 2000 units CAPS  Take 2 capsules (4,000 Units total) by mouth daily. (Patient not taking: Reported on 12/16/2016) 30 capsule 11 Not Taking    Review of Systems  Constitutional: Negative.   HENT: Negative.   Eyes: Negative.   Respiratory: Negative.   Cardiovascular: Negative.   Gastrointestinal: Positive for abdominal pain.  Endocrine: Negative.   Genitourinary: Positive for vaginal bleeding and vaginal discharge.  Musculoskeletal: Positive for back pain.  Skin: Negative.   Allergic/Immunologic: Negative.   Neurological: Negative.   Hematological: Negative.   Psychiatric/Behavioral: Negative.     Physical Exam   Blood pressure 130/76, pulse 82, temperature 98.2 F (36.8 C), temperature source Oral, resp. rate 16, height 5\' 7"  (1.702 m), weight 56.7 kg (125 lb), last menstrual period 10/10/2016.  Physical Exam  Constitutional: She is oriented to person, place, and time. She appears well-developed.  HENT:  Head: Normocephalic.  Eyes: Pupils are equal, round, and reactive to light.  Neck: Normal range of motion.  Cardiovascular: Normal rate, regular rhythm and normal heart sounds.   Respiratory: Effort normal and breath sounds normal.  GI: Soft. Bowel sounds are normal.  Genitourinary:  Genitourinary Comments: Uterus: enlarged, cx; smooth, pink, no lesions, small amt of watery, blood tinged fluid, closed/long/firm, no CMT or friability, no adnexal tenderness, wet prep and GC/CT samples collected  Musculoskeletal: Normal range of motion.  Neurological: She is alert and oriented to person, place, and time.  Skin: Skin is warm and dry.  Multiple older scars on LT LE and bandage on RT LE  Psychiatric: She has a normal mood and affect. Her behavior is normal. Judgment and thought content normal.    MAU Course  Procedures  MDM CCUA Wet Prep GC/CT OB Limited U/S Results for orders placed or performed during the hospital encounter of 12/23/16 (from the past 24 hour(s))  Urinalysis, Routine w reflex microscopic     Status: Abnormal   Collection Time: 12/23/16  8:07 PM  Result Value Ref Range   Color, Urine YELLOW YELLOW   APPearance CLEAR CLEAR   Specific Gravity, Urine 1.011 1.005 - 1.030   pH 6.0 5.0 - 8.0   Glucose, UA NEGATIVE NEGATIVE mg/dL   Hgb urine dipstick MODERATE (A) NEGATIVE   Bilirubin Urine NEGATIVE NEGATIVE   Ketones, ur NEGATIVE NEGATIVE mg/dL   Protein, ur 30 (A) NEGATIVE mg/dL   Nitrite NEGATIVE NEGATIVE   Leukocytes, UA NEGATIVE NEGATIVE   RBC / HPF 6-30 0 - 5 RBC/hpf   WBC, UA 0-5 0 - 5 WBC/hpf   Bacteria, UA NONE SEEN NONE SEEN   Squamous  Epithelial / LPF 0-5 (A) NONE SEEN   Mucous PRESENT   Wet prep, genital     Status: Abnormal   Collection Time: 12/23/16  8:40 PM  Result Value Ref Range   Yeast Wet Prep HPF POC NONE SEEN NONE SEEN   Trich, Wet Prep NONE SEEN NONE SEEN   Clue Cells Wet Prep HPF POC NONE SEEN NONE SEEN   WBC, Wet Prep HPF POC FEW (A) NONE SEEN   Sperm NONE SEEN    US Ob Comp Less 14 Wks  Result Date: 12/23/2016 CLINICAL DATA:  Bleeding in the first-trimester pregnancy EXAM: OBSTETRIC <14 WK ULTRASOUND TECHNIQUE: Transabdominal ultrasound was performed for evaluation of the gestation as well as the maternal uterus and adnexal regions. COMPARISON:  None. FINDINGS: Intrauterine gestational sac: Single Yolk sac:  Not Visualized. Embryo:  Visualized. Cardiac Activity: Visualized Heart Rate: 162 bpm CRL:   50  mm  11 w 5 d                  [place] EDC: 07/09/2017 Subchorionic hemorrhage: Hypoechoic focus along the anterior aspect of the gestational sac suspicious for subchorionic hemorrhage measuring 5.1 x 1.6 x 5.7 cm. Maternal uterus/adnexae: Normal bilateral ovaries with the right measuring 2.3 x 1.3 x 2.1 cm on the left measuring 3.9 x 3.2 x 3.6 cm. IMPRESSION: Viable 11 week 5 day intrauterine gestation with adjacent anterior hypoechoic focus suspicious for subchorionic hemorrhage measuring 5.1 x 1.6 x 5.7 cm. Follow-up is therefore recommended. Ultrasound [facility] 07/09/2017 based on today's study. Electronically Signed   By: Ashley Royalty M.D.   On: 12/23/2016 21:51    Fetal HR by doppler = 169 bpm  Assessment and Plan  Subchorionic hematoma in first trimester, single or unspecified fetus - Instructions on Subchorionic hemorrhage given - Advised to return to MAU for increased bleeding like a period and/or abdominal pain not relieved with Tylenol - Keep scheduled appt with [place] on 8/29  Discharge home Patient verbalized an understanding of the plan of care and agrees.   Laury Deep, MSN, CNM 12/23/2016, 8:33 PM

## 2016-12-23 NOTE — MAU Note (Signed)
Pt states she was at the store and she started having water and blood leaking out of vagina around 7pm. Pt states she had a lot of lower abdominal and back pain and feeling like she needs to have a BM.

## 2016-12-24 LAB — HEMOGLOBINOPATHY EVALUATION
HEMATOCRIT: 22.8 % — AB (ref 35.0–45.0)
HEMOGLOBIN: 7.9 g/dL — AB (ref 11.7–15.5)
HGB A2 QUANT: 4.4 % — AB (ref 1.8–3.5)
HGB A: 20.4 % — AB (ref >96.0)
Hgb F Quant: 5.8 % — ABNORMAL HIGH (ref <2.0)
Hgb S Quant: 69.4 % — ABNORMAL HIGH
MCH: 30 pg (ref 27.0–33.0)
MCV: 86.7 fL (ref 80.0–100.0)
RDW: 19.3 % — AB (ref 11.0–15.0)
RED BLOOD CELL COUNT: 2.63 MIL/uL — AB (ref 3.80–5.10)

## 2016-12-24 LAB — GC/CHLAMYDIA PROBE AMP (~~LOC~~) NOT AT ARMC
CHLAMYDIA, DNA PROBE: NEGATIVE
Neisseria Gonorrhea: NEGATIVE

## 2016-12-25 ENCOUNTER — Telehealth: Payer: Self-pay | Admitting: *Deleted

## 2016-12-25 DIAGNOSIS — O2341 Unspecified infection of urinary tract in pregnancy, first trimester: Secondary | ICD-10-CM

## 2016-12-25 NOTE — Telephone Encounter (Signed)
-----   Message from [name], MD sent at 12/25/2016  2:02 PM EDT ----- Ampicillin 500 mg po tid x 7 days for UTI Thanks Legrand Como

## 2016-12-26 MED ORDER — AMPICILLIN 500 MG PO CAPS: 500.0000 mg | ORAL_CAPSULE | Freq: Three times a day (TID) | ORAL | 0 refills | Status: DC

## 2016-12-26 NOTE — Telephone Encounter (Signed)
12/26/16 1:04 Late entry from 12/25/16 approximately 2:50 pm due to epic shut down 12/25/16 and unable to chart.  Called patient 88/18 and notified her per Dr. Rip Harbour message that she has a uti and  Needs to take ampicillin. RX sent to pharmacy of her choice.  She voices understanding.

## 2017-01-15 ENCOUNTER — Ambulatory Visit (INDEPENDENT_AMBULATORY_CARE_PROVIDER_SITE_OTHER): Payer: Medicaid Other | Admitting: Obstetrics & Gynecology

## 2017-01-15 ENCOUNTER — Encounter: Payer: Self-pay | Admitting: Obstetrics & Gynecology

## 2017-01-15 VITALS — BP 109/82 | HR 80 | Wt 129.0 lb

## 2017-01-15 DIAGNOSIS — Z8759 Personal history of other complications of pregnancy, childbirth and the puerperium: Secondary | ICD-10-CM

## 2017-01-15 DIAGNOSIS — D571 Sickle-cell disease 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: Secondary | ICD-10-CM

## 2017-01-15 DIAGNOSIS — O09521 Supervision of elderly multigravida, first trimester: Secondary | ICD-10-CM

## 2017-01-15 DIAGNOSIS — O99019 Anemia complicating pregnancy, unspecified trimester: Secondary | ICD-10-CM

## 2017-01-15 DIAGNOSIS — Z3689 Encounter for other specified antenatal screening: Secondary | ICD-10-CM

## 2017-01-15 DIAGNOSIS — O0990 Supervision of high risk pregnancy, unspecified, unspecified trimester: Secondary | ICD-10-CM

## 2017-01-15 MED ORDER — FOLIC ACID 1 MG PO TABS: 1.0000 mg | ORAL_TABLET | Freq: Every day | ORAL | 11 refills | Status: DC

## 2017-01-15 MED ORDER — FOLIC ACID 1 MG PO TABS: 5.0000 mg | ORAL_TABLET | Freq: Every day | ORAL | 11 refills | Status: DC

## 2017-01-15 MED ORDER — ASPIRIN EC 81 MG PO TBEC: 81.0000 mg | DELAYED_RELEASE_TABLET | Freq: Every day | ORAL | 2 refills | Status: DC

## 2017-01-15 MED FILL — FOLIC ACID 1 MG TABLET: 1 | 30 days supply | Qty: 150 | Fill #0

## 2017-01-15 NOTE — Progress Notes (Signed)
   PRENATAL VISIT NOTE  Subjective:  [name] is a 46 y.o. B8G6659 at [redacted]w[redacted]d being seen today for ongoing prenatal care.  She is currently monitored for the following issues for this high-risk pregnancy and has Cholelithiasis; Leukocytosis; Chronic wound of extremity; Sinus bradycardia by electrocardiogram; Chronic pain syndrome; Hemochromatosis; Previous cesarean delivery x 2; Advanced maternal age in multigravida; Hb-SS disease 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 ([place]); Hb-SS disease with crisis ([place]); Sickle cell anemia of mother during pregnancy [facility]); Supervision of high risk pregnancy, antepartum; History of pre-eclampsia; and Subchorionic hematoma in first trimester on her problem list.  Patient reports no complaints.  Contractions: Not present. Vag. Bleeding: Scant.  Movement: Absent. Denies leaking of fluid.   The following portions of the patient's history were reviewed and updated as appropriate: allergies, current medications, past family history, past medical history, past social history, past surgical history and problem list. Problem list updated.  Objective:   Vitals:   01/15/17 1048  BP: 109/82  Pulse: 80  Weight: 129 lb (58.5 kg)    Fetal Status: Fetal Heart Rate (bpm): 153   Movement: Absent     General:  Alert, oriented and cooperative. Patient is in no acute distress.  Skin: Skin is warm and dry. No rash noted.   Cardiovascular: Normal heart rate noted  Respiratory: Normal respiratory effort, no problems with respiration noted  Abdomen: Soft, gravid, appropriate for gestational age.  Pain/Pressure: Present     Pelvic: Cervical exam deferred        Extremities: Normal range of motion.  Edema: None  Mental Status:  Normal mood and affect. Normal behavior. Normal judgment and thought content.   Assessment and Plan:  Pregnancy: D3T7017 at [redacted]w[redacted]d  1. Sickle cell anemia of mother during pregnancy [facility]) Desires Folic acid 5 mg daily.  No current concerning symptoms. - folic acid  (FOLVITE) 1 MG tablet; Take 5 tablets (5 mg total) by mouth daily.  Dispense: 150 tablet; Refill: 11 - [place] MFM OB DETAIL +14 WK; Future  2. History of pre-eclampsia Aspirin prescribed.  - aspirin EC 81 MG tablet; Take 1 tablet (81 mg total) by mouth daily. Take after 12 weeks for prevention of preeclampsia later in pregnancy  Dispense: 300 tablet; Refill: 2  3. Encounter for fetal anatomic survey Anatomy screen ordered - US MFM OB DETAIL +14 WK; Future  4. Elderly multigravida in first trimester 5. Supervision of high risk pregnancy, antepartum Declines any genetic screening. No other complaints or concerns.  Routine obstetric precautions reviewed. Please refer to After Visit Summary for other counseling recommendations.  Return in about 4 weeks (around 02/12/2017) for OB Visit ([name]).   [name], MD

## 2017-01-15 NOTE — Patient Instructions (Signed)
Return to clinic for any scheduled appointments or obstetric concerns, or go to MAU for evaluation  

## 2017-01-19 ENCOUNTER — Encounter (HOSPITAL_COMMUNITY): Payer: Self-pay | Admitting: *Deleted

## 2017-01-19 ENCOUNTER — Emergency Department (HOSPITAL_COMMUNITY)
Admission: EM | Admit: 2017-01-19 | Discharge: 2017-01-19 | Disposition: A | Discharge status: Home / Self Care | Payer: Medicaid Other | Attending: Emergency Medicine | Admitting: Emergency Medicine

## 2017-01-19 DIAGNOSIS — D5700 Hb-SS disease with crisis, unspecified: Secondary | ICD-10-CM | POA: Insufficient documentation

## 2017-01-19 DIAGNOSIS — Z3A14 14 weeks gestation of pregnancy: Secondary | ICD-10-CM | POA: Diagnosis not present

## 2017-01-19 DIAGNOSIS — Z3490 Encounter for supervision of normal pregnancy, unspecified, unspecified trimester: Secondary | ICD-10-CM

## 2017-01-19 DIAGNOSIS — O26892 Other specified pregnancy related conditions, second trimester: Secondary | ICD-10-CM | POA: Diagnosis not present

## 2017-01-19 LAB — CBC
HCT: 21.4 % — ABNORMAL LOW (ref 36.0–46.0)
Hemoglobin: 7.9 g/dL — ABNORMAL LOW (ref 12.0–15.0)
MCH: 31.7 pg (ref 26.0–34.0)
MCHC: 36.9 g/dL — AB (ref 30.0–36.0)
MCV: 85.9 fL (ref 78.0–100.0)
PLATELETS: 483 10*3/uL — AB (ref 150–400)
RBC: 2.49 MIL/uL — ABNORMAL LOW (ref 3.87–5.11)
RDW: 19.4 % — AB (ref 11.5–15.5)
WBC: 19.2 10*3/uL — AB (ref 4.0–10.5)

## 2017-01-19 LAB — COMPREHENSIVE METABOLIC PANEL
ALBUMIN: 3.7 g/dL (ref 3.5–5.0)
ALK PHOS: 60 U/L (ref 38–126)
ALT: 23 U/L (ref 14–54)
AST: 43 U/L — AB (ref 15–41)
Anion gap: 11 (ref 5–15)
BILIRUBIN TOTAL: 3.7 mg/dL — AB (ref 0.3–1.2)
CALCIUM: 9.6 mg/dL (ref 8.9–10.3)
CO2: 21 mmol/L — ABNORMAL LOW (ref 22–32)
Chloride: 104 mmol/L (ref 101–111)
Creatinine, Ser: <0.3 mg/dL — ABNORMAL LOW (ref 0.44–1.00)
GLUCOSE: 81 mg/dL (ref 65–99)
Potassium: 3.1 mmol/L — ABNORMAL LOW (ref 3.5–5.1)
Sodium: 136 mmol/L (ref 135–145)
TOTAL PROTEIN: 7.5 g/dL (ref 6.5–8.1)

## 2017-01-19 LAB — RETICULOCYTES: RBC.: 2.49 MIL/uL — ABNORMAL LOW (ref 3.87–5.11)

## 2017-01-19 MED ORDER — HYDROMORPHONE HCL 1 MG/ML IJ SOLN
1.0000 mg | Freq: Once | INTRAMUSCULAR | Status: AC
  Administered 2017-01-19: 1 mg via INTRAVENOUS
  Filled 2017-01-19: qty 1

## 2017-01-19 MED ORDER — HYDROMORPHONE HCL 1 MG/ML IJ SOLN
2.0000 mg | Freq: Once | INTRAMUSCULAR | Status: AC
  Administered 2017-01-19: 2 mg via INTRAVENOUS
  Filled 2017-01-19: qty 2

## 2017-01-19 MED ORDER — POTASSIUM CHLORIDE CRYS ER 20 MEQ PO TBCR
40.0000 meq | EXTENDED_RELEASE_TABLET | Freq: Once | ORAL | Status: AC
  Administered 2017-01-19: 40 meq via ORAL
  Filled 2017-01-19: qty 2

## 2017-01-19 MED ORDER — SODIUM CHLORIDE 0.9 % IV BOLUS (SEPSIS)
500.0000 mL | Freq: Once | INTRAVENOUS | Status: AC
  Administered 2017-01-19: 500 mL via INTRAVENOUS

## 2017-01-19 NOTE — ED Notes (Signed)
ED Provider at bedside. 

## 2017-01-19 NOTE — ED Notes (Signed)
Ice water given

## 2017-01-19 NOTE — Discharge Instructions (Signed)
It was our pleasure to provide your ER care today - we hope that you feel better.  Rest. Drink adequate fluids.  Follow up with your sickle cell doctor this Tuesday for recheck.  Follow up with your ob gyn doctor in the next 1-2 weeks.   Return to ER right away if worse, new symptoms, fevers, intractable pain, chest pain, trouble breathing, other concern.   You were given pain medication in the ER - no driving for the next 6 hours.

## 2017-01-19 NOTE — ED Provider Notes (Signed)
[facility] DEPT Provider Note   CSN: [id number] Arrival date & time: 01/19/17  1227     History   Chief Complaint Chief Complaint  Patient presents with  . Sickle Cell Pain Crisis    HPI [name] is a 46 y.o. female.  Pt with hx sickle cell disease, and currently [redacted] weeks pregnant, c/o having her typical sickle cell pain crisis.  Patient c/o pain to bilateral arms. Pain constant, dull, mod-severe, states home oxycodone not helping. No associated fevers. No chest pain, cough or trouble breathing. No chills or sweats. No nv. No abd pain.    The history is provided by the patient.  Sickle Cell Pain Crisis  Associated symptoms: no chest pain, no fever, no headaches, no shortness of breath, no sore throat and no vomiting     Past Medical History:  Diagnosis Date  . Anemia 03/30/2012   Hx of sickle cell disease  . CAP (community acquired pneumonia)    2014  . Cholelithiasis y-1  . Elevated LFTs   . Leg ulcer ([place]) 10/27/2012   Chronic under care of wound clinic  . Multiple open wounds of lower extremity    chronic wounds B/LLE  . Sickle cell disease [facility])     Patient Active Problem List   Diagnosis Date Noted  . Subchorionic hematoma in first trimester 12/23/2016  . Supervision of high risk pregnancy, antepartum 12/16/2016  . History of pre-eclampsia 12/16/2016  . Sickle cell anemia of mother during pregnancy ([place]) 11/09/2016  . Hb-SS disease with crisis ([place]) 08/16/2016  . Hb-SS disease 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 ([place]) 03/24/2014  . Advanced maternal age in multigravida 07/28/2013  . Previous cesarean delivery x 2 07/22/2013  . Hemochromatosis 11/10/2012  . Chronic pain syndrome 10/13/2012  . Sinus bradycardia by electrocardiogram 04/03/2012  . Chronic wound of extremity 04/01/2012  . Cholelithiasis 03/30/2012  . Leukocytosis 03/30/2012    Past Surgical History:  Procedure Laterality Date  . ALLOGRAFT APPLICATION Right 08/26/8117   Procedure: SURGICAL PREP FOR  GRAFTING RIGHT LOWER EXTREMITY AND APPLICATION OF Jannifer Hick;  Surgeon: Irene Limbo, MD;  Location: [place];  Service: Plastics;  Laterality: Right;  . APPLICATION OF A-CELL OF EXTREMITY Right 10/09/2015   Procedure: APPLICATION OF Jannifer Hick;  Surgeon: Irene Limbo, MD;  Location: [place];  Service: Plastics;  Laterality: Right;  . BREAST SURGERY     left breast cyst aspiration  . CESAREAN SECTION    . CESAREAN SECTION N/A 01/06/2014   Procedure: CESAREAN SECTION;  Surgeon: Emily Filbert, MD;  Location: [place] [facility];  Service: Obstetrics;  Laterality: N/A;  . I&D EXTREMITY Right 10/09/2015   Procedure: SURGICAL PREPARATION FOR GRAFTING RIGHT ANKLE AND APPLICATION THERASKIN;  Surgeon: Irene Limbo, MD;  Location: [place];  Service: Plastics;  Laterality: Right;  . SKIN FULL THICKNESS GRAFT Bilateral 06/17/2016   Procedure: SURGICAL PREP FOR GRAFTING, BILATERAL LOWER EXTREMITIES AND APPLICATION OF Jannifer Hick;  Surgeon: Irene Limbo, MD;  Location: [place];  Service: Plastics;  Laterality: Bilateral;  . TONSILLECTOMY      OB History    Gravida Para Term Preterm AB Living   8 4 1 3 2 2    SAB TAB Ectopic Multiple Live Births   2       3       Home Medications    Prior to Admission medications   Medication Sig Start Date End Date Taking? Authorizing Provider  aspirin EC 81 MG tablet Take 1 tablet (81  mg total) by mouth daily. Take after 12 weeks for prevention of preeclampsia later in pregnancy 01/15/17  Yes Anyanwu, Sallyanne Havers, MD  Cholecalciferol (VITAMIN D) 2000 units CAPS Take 2 capsules (4,000 Units total) by mouth daily. 06/03/16  Yes [name], FNP  Clobetasol Prop Emollient Base (CLOBETASOL PROPIONATE E) 0.05 % emollient cream Apply 1 application topically 2 (two) times daily. 12/20/16  Yes [name], FNP  folic acid (FOLVITE) 1 MG tablet Take 5 tablets (5 mg total) by mouth daily. 01/15/17  Yes Anyanwu, Sallyanne Havers, MD    Oxycodone HCl 10 MG TABS Take 1 tablet (10 mg total) by mouth every 4 (four) hours as needed (pain). 12/20/16  Yes [name], FNP  Prenatal Multivit-Min-Fe-FA (PRENATAL VITAMINS) 0.8 MG tablet Take 1 tablet by mouth daily. 11/14/16  Yes Leana Gamer, MD  RESTA SILVER GEL Apply topically daily.   Yes [provider]    Family History Family History  Problem Relation Age of Onset  . Sickle cell anemia Sister   . Diabetes Mother   . Asthma Mother   . Sickle cell trait Mother   . Sickle cell trait Father   . Hypertension Father     Social History Social History  Substance Use Topics  . Smoking status: Never Smoker  . Smokeless tobacco: Never Used  . Alcohol use No     Allergies   Patient has no known allergies.   Review of Systems Review of Systems  Constitutional: Negative for chills and fever.  HENT: Negative for sore throat.   Eyes: Negative for redness.  Respiratory: Negative for shortness of breath.   Cardiovascular: Negative for chest pain and leg swelling.  Gastrointestinal: Negative for abdominal pain and vomiting.  Genitourinary: Negative for dysuria and flank pain.  Musculoskeletal: Negative for back pain and neck pain.  Skin: Negative for rash.  Neurological: Negative for headaches.  Hematological: Does not bruise/bleed easily.  Psychiatric/Behavioral: Negative for confusion.     Physical Exam Updated Vital Signs BP 139/81 (BP Location: Right Arm)   Pulse 81   Temp 98.3 F (36.8 C) (Oral)   Resp 16   Ht 1.702 m (5\' 7" )   Wt 58.5 kg (129 lb)   LMP 10/10/2016 Comment: patient double shielded  SpO2 100%   BMI 20.20 kg/m   Physical Exam  Constitutional: She appears well-developed and well-nourished. No distress.  HENT:  Mouth/Throat: Oropharynx is clear and moist.  Eyes: Conjunctivae are normal. No scleral icterus.  Neck: Neck supple. No tracheal deviation present.  Cardiovascular: Normal rate, regular rhythm, normal heart  sounds and intact distal pulses.  Exam reveals no gallop and no friction rub.   No murmur heard. Pulmonary/Chest: Effort normal and breath sounds normal. No respiratory distress.  Abdominal: Soft. Normal appearance and bowel sounds are normal. She exhibits no distension. There is no tenderness.  Musculoskeletal: She exhibits no edema or tenderness.  No swelling. No focal bony tenderness. Distal pulses palp.   Neurological: She is alert.  Skin: Skin is warm and dry. No rash noted.  Psychiatric: She has a normal mood and affect.  Nursing note and vitals reviewed.    ED Treatments / Results  Labs (all labs ordered are listed, but only abnormal results are displayed) Results for orders placed or performed during the hospital encounter of 01/19/17  CBC  Result Value Ref Range   WBC 19.2 (H) 4.0 - 10.5 K/uL   RBC 2.49 (L) 3.87 - 5.11 MIL/uL  Hemoglobin 7.9 (L) 12.0 - 15.0 g/dL   HCT 21.4 (L) 36.0 - 46.0 %   MCV 85.9 78.0 - 100.0 fL   MCH 31.7 26.0 - 34.0 pg   MCHC 36.9 (H) 30.0 - 36.0 g/dL   RDW 19.4 (H) 11.5 - 15.5 %   Platelets 483 (H) 150 - 400 K/uL  Comprehensive metabolic panel  Result Value Ref Range   Sodium 136 135 - 145 mmol/L   Potassium 3.1 (L) 3.5 - 5.1 mmol/L   Chloride 104 101 - 111 mmol/L   CO2 21 (L) 22 - 32 mmol/L   Glucose, Bld 81 65 - 99 mg/dL   BUN <5 (L) 6 - 20 mg/dL   Creatinine, Ser <0.30 (L) 0.44 - 1.00 mg/dL   Calcium 9.6 8.9 - 10.3 mg/dL   Total Protein 7.5 6.5 - 8.1 g/dL   Albumin 3.7 3.5 - 5.0 g/dL   AST 43 (H) 15 - 41 U/L   ALT 23 14 - 54 U/L   Alkaline Phosphatase 60 38 - 126 U/L   Total Bilirubin 3.7 (H) 0.3 - 1.2 mg/dL   GFR calc non Af Amer NOT CALCULATED >60 mL/min   GFR calc Af Amer NOT CALCULATED >60 mL/min   Anion gap 11 5 - 15  Reticulocytes  Result Value Ref Range   Retic Ct Pct >23.0 (H) 0.4 - 3.1 %   RBC. 2.49 (L) 3.87 - 5.11 MIL/uL   Retic Count, Absolute NOT CALCULATED 19.0 - 186.0 K/uL   US Ob Comp Less 14 Wks  Result Date:  12/23/2016 CLINICAL DATA:  Bleeding in the first-trimester pregnancy EXAM: OBSTETRIC <14 WK ULTRASOUND TECHNIQUE: Transabdominal ultrasound was performed for evaluation of the gestation as well as the maternal uterus and adnexal regions. COMPARISON:  None. FINDINGS: Intrauterine gestational sac: Single Yolk sac:  Not Visualized. Embryo:  Visualized. Cardiac Activity: Visualized Heart Rate: 162 bpm CRL:   50  mm   11 w 5 d                  [place] EDC: 07/09/2017 Subchorionic hemorrhage: Hypoechoic focus along the anterior aspect of the gestational sac suspicious for subchorionic hemorrhage measuring 5.1 x 1.6 x 5.7 cm. Maternal uterus/adnexae: Normal bilateral ovaries with the right measuring 2.3 x 1.3 x 2.1 cm on the left measuring 3.9 x 3.2 x 3.6 cm. IMPRESSION: Viable 11 week 5 day intrauterine gestation with adjacent anterior hypoechoic focus suspicious for subchorionic hemorrhage measuring 5.1 x 1.6 x 5.7 cm. Follow-up is therefore recommended. Ultrasound [facility] 07/09/2017 based on today's study. Electronically Signed   By: Ashley Royalty M.D.   On: 12/23/2016 21:51    EKG  EKG Interpretation None       Radiology No results found.  Procedures Procedures (including critical care time)  Medications Ordered in ED Medications  sodium chloride 0.9 % bolus 500 mL (not administered)     Initial Impression / Assessment and Plan / ED Course  I have reviewed the triage vital signs and the nursing notes.  Pertinent labs & imaging results that were available during my care of the patient were reviewed by me and considered in my medical decision making (see chart for details).  Iv ns bolus. o2 [place]. Labs sent.  Dilaudid 1 mg iv.   Reviewed nursing notes and prior charts for additional history.   Pt notes no relief with initial pain med dose.  Dilaudid 2 mg iv.   Recheck pt - offered admission if pain  persists/uncontrolled.  Patient requests d/c, states if feeling improved. Room air pulse ox 97%. No  cough. No chest pain.  Wbc elev, hx same, no fever, chills or sweats.   Patient prefers to go home, requests d/c. Return precautions provided.   Final Clinical Impressions(s) / ED Diagnoses   Final diagnoses:  None    New Prescriptions New Prescriptions   No medications on file     Lajean Saver, MD 01/19/17 2050

## 2017-01-19 NOTE — ED Triage Notes (Signed)
Patient is alert and oriented x4. She is being seen for sickle cell crisis that started last night.  Patient Currently rates her pain in her left arm 8 of 10.

## 2017-01-21 ENCOUNTER — Encounter (HOSPITAL_COMMUNITY): Payer: Self-pay | Admitting: Emergency Medicine

## 2017-01-21 ENCOUNTER — Telehealth (HOSPITAL_COMMUNITY): Payer: Self-pay | Admitting: *Deleted

## 2017-01-21 ENCOUNTER — Emergency Department (HOSPITAL_COMMUNITY)
Admission: EM | Admit: 2017-01-21 | Discharge: 2017-01-21 | Disposition: A | Discharge status: Home / Self Care | Payer: Medicaid Other | Attending: Emergency Medicine | Admitting: Emergency Medicine

## 2017-01-21 DIAGNOSIS — D57219 Sickle-cell/Hb-C disease with crisis, unspecified: Secondary | ICD-10-CM | POA: Diagnosis not present

## 2017-01-21 DIAGNOSIS — O9989 Other specified diseases and conditions complicating pregnancy, childbirth and the puerperium: Secondary | ICD-10-CM | POA: Diagnosis present

## 2017-01-21 DIAGNOSIS — Z3A14 14 weeks gestation of pregnancy: Secondary | ICD-10-CM | POA: Insufficient documentation

## 2017-01-21 DIAGNOSIS — Z79899 Other long term (current) drug therapy: Secondary | ICD-10-CM | POA: Insufficient documentation

## 2017-01-21 DIAGNOSIS — Z7982 Long term (current) use of aspirin: Secondary | ICD-10-CM | POA: Insufficient documentation

## 2017-01-21 DIAGNOSIS — D5700 Hb-SS disease with crisis, unspecified: Secondary | ICD-10-CM

## 2017-01-21 LAB — COMPREHENSIVE METABOLIC PANEL
ALT: 28 U/L (ref 14–54)
AST: 51 U/L — ABNORMAL HIGH (ref 15–41)
Albumin: 3.7 g/dL (ref 3.5–5.0)
Alkaline Phosphatase: 65 U/L (ref 38–126)
Anion gap: 8 (ref 5–15)
BUN: <5 mg/dL — ABNORMAL LOW (ref 6–20)
CHLORIDE: 104 mmol/L (ref 101–111)
CO2: 23 mmol/L (ref 22–32)
Calcium: 9.3 mg/dL (ref 8.9–10.3)
Glucose, Bld: 84 mg/dL (ref 65–99)
POTASSIUM: 3.4 mmol/L — AB (ref 3.5–5.1)
Sodium: 135 mmol/L (ref 135–145)
TOTAL PROTEIN: 7.6 g/dL (ref 6.5–8.1)
Total Bilirubin: 3.6 mg/dL — ABNORMAL HIGH (ref 0.3–1.2)

## 2017-01-21 LAB — CBC WITH DIFFERENTIAL/PLATELET
BAND NEUTROPHILS: 0 %
BASOS ABS: 0 10*3/uL (ref 0.0–0.1)
Basophils Relative: 0 %
Blasts: 0 %
EOS ABS: 0.2 10*3/uL (ref 0.0–0.7)
EOS PCT: 1 %
HCT: 22.7 % — ABNORMAL LOW (ref 36.0–46.0)
Hemoglobin: 8 g/dL — ABNORMAL LOW (ref 12.0–15.0)
LYMPHS ABS: 1.5 10*3/uL (ref 0.7–4.0)
Lymphocytes Relative: 8 %
MCH: 30.7 pg (ref 26.0–34.0)
MCHC: 35.2 g/dL (ref 30.0–36.0)
MCV: 87 fL (ref 78.0–100.0)
METAMYELOCYTES PCT: 0 %
MONOS PCT: 6 %
Monocytes Absolute: 1.1 10*3/uL — ABNORMAL HIGH (ref 0.1–1.0)
Myelocytes: 0 %
NEUTROS ABS: 15.9 10*3/uL — AB (ref 1.7–7.7)
Neutrophils Relative %: 85 %
Other: 0 %
PLATELETS: 492 10*3/uL — AB (ref 150–400)
Promyelocytes Absolute: 0 %
RBC: 2.61 MIL/uL — ABNORMAL LOW (ref 3.87–5.11)
RDW: 20.3 % — AB (ref 11.5–15.5)
WBC: 18.7 10*3/uL — ABNORMAL HIGH (ref 4.0–10.5)
nRBC: 12 /100 WBC — ABNORMAL HIGH

## 2017-01-21 LAB — RETICULOCYTES
RBC.: 2.61 MIL/uL — AB (ref 3.87–5.11)
Retic Ct Pct: >23 % — ABNORMAL HIGH (ref 0.4–3.1)

## 2017-01-21 MED ORDER — HYDROMORPHONE HCL 1 MG/ML IJ SOLN: 2.0000 mg | INTRAMUSCULAR | Status: DC

## 2017-01-21 MED ORDER — HYDROMORPHONE HCL 1 MG/ML IJ SOLN
1.0000 mg | INTRAMUSCULAR | Status: AC
  Administered 2017-01-21: 1 mg via INTRAVENOUS
  Filled 2017-01-21: qty 1

## 2017-01-21 MED ORDER — HYDROMORPHONE HCL 1 MG/ML IJ SOLN
2.0000 mg | INTRAMUSCULAR | Status: AC
  Administered 2017-01-21: 2 mg via INTRAVENOUS
  Filled 2017-01-21: qty 2

## 2017-01-21 MED ORDER — HYDROMORPHONE HCL 1 MG/ML IJ SOLN: 2.0000 mg | INTRAMUSCULAR | Status: AC

## 2017-01-21 MED ORDER — DEXTROSE-NACL 5-0.45 % IV SOLN
INTRAVENOUS | Status: DC
  Administered 2017-01-21: 12:00:00 via INTRAVENOUS

## 2017-01-21 MED ORDER — HYDROMORPHONE HCL 1 MG/ML IJ SOLN: 1.0000 mg | INTRAMUSCULAR | Status: AC

## 2017-01-21 NOTE — Telephone Encounter (Signed)
Patient called the Patient [place] requesting treatment. Patient  c/o left hand pain and rates pain 10/10 on pain scale. Patient denies fever, chest pain, N/V/D or abdominal pain. Patient reports taking her home medication Oxycodone every 4 hours with no relief.   Patient placed on a brief hold and provider Lavell Anchors FNP made aware. Patient advised to go to [facility] ER for evaluation considering her WBC was elevated 2 days ago and patient is [redacted] weeks pregnant. Patient states an understanding.

## 2017-01-21 NOTE — ED Triage Notes (Signed)
Pt complaint of continued left arm pain associated with SCC; pt is [redacted] weeks pregnant; seen here on Sunday for same.

## 2017-01-21 NOTE — ED Provider Notes (Signed)
[place] DEPT Provider Note   CSN: [id number] Arrival date & time: 01/21/17  0900     History   Chief Complaint Chief Complaint  Patient presents with  . Sickle Cell Pain Crisis  . Pregnant    HPI [name] is a 46 y.o. female.  The history is provided by the patient. No language interpreter was used.  Sickle Cell Pain Crisis   [name] is a 46 y.o. female who presents to the Emergency Department complaining of sickle cell pain.  She has a history of sickle cell disease and reports pain in her left entire arm since Saturday night. Pain is typical for her sickle cell pain crises. She has been taking oxycodone 10 is only partial improvement in her symptoms. She denies any fevers, chest pain, shortness of breath, vomiting, diarrhea, vaginal bleeding. She is [redacted] weeks pregnant. Past Medical History:  Diagnosis Date  . Anemia 03/30/2012   Hx of sickle cell disease  . CAP (community acquired pneumonia)    2014  . Cholelithiasis y-1  . Elevated LFTs   . Leg ulcer ([place]) 10/27/2012   Chronic under care of wound clinic  . Multiple open wounds of lower extremity    chronic wounds B/LLE  . Sickle cell disease [facility])     Patient Active Problem List   Diagnosis Date Noted  . Subchorionic hematoma in first trimester 12/23/2016  . Supervision of high risk pregnancy, antepartum 12/16/2016  . History of pre-eclampsia 12/16/2016  . Sickle cell anemia of mother during pregnancy ([place]) 11/09/2016  . Hb-SS disease with crisis ([place]) 08/16/2016  . Hb-SS disease 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 ([place]) 03/24/2014  . Advanced maternal age in multigravida 07/28/2013  . Previous cesarean delivery x 2 07/22/2013  . Hemochromatosis 11/10/2012  . Chronic pain syndrome 10/13/2012  . Sinus bradycardia by electrocardiogram 04/03/2012  . Chronic wound of extremity 04/01/2012  . Cholelithiasis 03/30/2012  . Leukocytosis 03/30/2012    Past Surgical History:  Procedure Laterality Date  . ALLOGRAFT  APPLICATION Right 7/49/4496   Procedure: SURGICAL PREP FOR GRAFTING RIGHT LOWER EXTREMITY AND APPLICATION OF Jannifer Hick;  Surgeon: Irene Limbo, MD;  Location: [place];  Service: Plastics;  Laterality: Right;  . APPLICATION OF A-CELL OF EXTREMITY Right 10/09/2015   Procedure: APPLICATION OF Jannifer Hick;  Surgeon: Irene Limbo, MD;  Location: [place];  Service: Plastics;  Laterality: Right;  . BREAST SURGERY     left breast cyst aspiration  . CESAREAN SECTION    . CESAREAN SECTION N/A 01/06/2014   Procedure: CESAREAN SECTION;  Surgeon: Emily Filbert, MD;  Location: [place] [facility];  Service: Obstetrics;  Laterality: N/A;  . I&D EXTREMITY Right 10/09/2015   Procedure: SURGICAL PREPARATION FOR GRAFTING RIGHT ANKLE AND APPLICATION THERASKIN;  Surgeon: Irene Limbo, MD;  Location: [place];  Service: Plastics;  Laterality: Right;  . SKIN FULL THICKNESS GRAFT Bilateral 06/17/2016   Procedure: SURGICAL PREP FOR GRAFTING, BILATERAL LOWER EXTREMITIES AND APPLICATION OF Jannifer Hick;  Surgeon: Irene Limbo, MD;  Location: [place];  Service: Plastics;  Laterality: Bilateral;  . TONSILLECTOMY      OB History    Gravida Para Term Preterm AB Living   8 4 1 3 2 2    SAB TAB Ectopic Multiple Live Births   2       3       Home Medications    Prior to Admission medications   Medication Sig Start Date End Date Taking? Authorizing Provider  aspirin EC  81 MG tablet Take 1 tablet (81 mg total) by mouth daily. Take after 12 weeks for prevention of preeclampsia later in pregnancy 01/15/17  Yes Anyanwu, Sallyanne Havers, MD  Cholecalciferol (VITAMIN D) 2000 units CAPS Take 2 capsules (4,000 Units total) by mouth daily. 06/03/16  Yes [name], FNP  Clobetasol Prop Emollient Base (CLOBETASOL PROPIONATE E) 0.05 % emollient cream Apply 1 application topically 2 (two) times daily. 12/20/16  Yes [name], FNP  folic acid (FOLVITE) 1 MG tablet Take 5 tablets (5 mg  total) by mouth daily. 01/15/17  Yes Anyanwu, Sallyanne Havers, MD  Oxycodone HCl 10 MG TABS Take 1 tablet (10 mg total) by mouth every 4 (four) hours as needed (pain). 12/20/16  Yes [name], FNP  Prenatal Multivit-Min-Fe-FA (PRENATAL VITAMINS) 0.8 MG tablet Take 1 tablet by mouth daily. 11/14/16  Yes Leana Gamer, MD  RESTA SILVER GEL Apply 1 application topically daily. Applies to leg   Yes [provider]    Family History Family History  Problem Relation Age of Onset  . Sickle cell anemia Sister   . Diabetes Mother   . Asthma Mother   . Sickle cell trait Mother   . Sickle cell trait Father   . Hypertension Father     Social History Social History  Substance Use Topics  . Smoking status: Never Smoker  . Smokeless tobacco: Never Used  . Alcohol use No     Allergies   Patient has no known allergies.   Review of Systems Review of Systems  All other systems reviewed and are negative.    Physical Exam Updated Vital Signs BP 133/77   Pulse 85   Temp 98.4 F (36.9 C) (Oral)   Resp (!) 28   Wt 58.5 kg (129 lb)   LMP 10/10/2016 Comment: patient double shielded  SpO2 95%   BMI 20.20 kg/m   Physical Exam  Constitutional: She is oriented to person, place, and time. She appears well-developed and well-nourished.  HENT:  Head: Normocephalic and atraumatic.  Cardiovascular: Normal rate and regular rhythm.   No murmur heard. Pulmonary/Chest: Effort normal and breath sounds normal. No respiratory distress.  Abdominal: Soft. There is no tenderness. There is no rebound and no guarding.  Musculoskeletal: She exhibits no edema or tenderness.  Neurological: She is alert and oriented to person, place, and time.  Skin: Skin is warm and dry.  Psychiatric: She has a normal mood and affect. Her behavior is normal.  Nursing note and vitals reviewed.    ED Treatments / Results  Labs (all labs ordered are listed, but only abnormal results are displayed) Labs  Reviewed  CBC WITH DIFFERENTIAL/PLATELET - Abnormal; Notable for the following:       Result Value   WBC 18.7 (*)    RBC 2.61 (*)    Hemoglobin 8.0 (*)    HCT 22.7 (*)    RDW 20.3 (*)    Platelets 492 (*)    nRBC 12 (*)    Neutro Abs 15.9 (*)    Monocytes Absolute 1.1 (*)    All other components within normal limits  RETICULOCYTES - Abnormal; Notable for the following:    Retic Ct Pct >23.0 (*)    RBC. 2.61 (*)    All other components within normal limits  COMPREHENSIVE METABOLIC PANEL - Abnormal; Notable for the following:    Potassium 3.4 (*)    BUN <5 (*)    Creatinine, Ser <0.30 (*)  AST 51 (*)    Total Bilirubin 3.6 (*)    All other components within normal limits    EKG  EKG Interpretation None       Radiology No results found.  Procedures Procedures (including critical care time)  Medications Ordered in ED Medications  dextrose 5 %-0.45 % sodium chloride infusion ( Intravenous New Bag/Given 01/21/17 1156)  HYDROmorphone (DILAUDID) injection 2 mg (not administered)    Or  HYDROmorphone (DILAUDID) injection 2 mg (not administered)  HYDROmorphone (DILAUDID) injection 2 mg (not administered)    Or  HYDROmorphone (DILAUDID) injection 2 mg (not administered)  HYDROmorphone (DILAUDID) injection 2 mg (not administered)    Or  HYDROmorphone (DILAUDID) injection 2 mg (not administered)  HYDROmorphone (DILAUDID) injection 1 mg (1 mg Intravenous Given 01/21/17 1228)    Or  HYDROmorphone (DILAUDID) injection 1 mg ( Subcutaneous See Alternative 01/21/17 1228)     Initial Impression / Assessment and Plan / ED Course  I have reviewed the triage vital signs and the nursing notes.  Pertinent labs & imaging results that were available during my care of the patient were reviewed by me and considered in my medical decision making (see chart for details).     Patient with history of sickle cell disease here for pain in her left arm typical for her pain crises. Her pain is  controlled after treatment in the emergency department and she requests discharge. There is no evidence of acute infectious process. She is well perfused on examination. Labs are at her baseline. Counseled patient on home care, outpatient follow-up and return precautions.  Final Clinical Impressions(s) / ED Diagnoses   Final diagnoses:  Sickle cell pain crisis [facility])    New Prescriptions New Prescriptions   No medications on file     Quintella Reichert, MD 01/22/17 [phone]

## 2017-01-23 ENCOUNTER — Encounter (HOSPITAL_COMMUNITY): Payer: Self-pay | Admitting: *Deleted

## 2017-01-23 ENCOUNTER — Encounter: Payer: Self-pay | Admitting: Family Medicine

## 2017-01-23 ENCOUNTER — Ambulatory Visit (INDEPENDENT_AMBULATORY_CARE_PROVIDER_SITE_OTHER): Payer: Medicaid Other | Admitting: Family Medicine

## 2017-01-23 ENCOUNTER — Non-Acute Institutional Stay (HOSPITAL_COMMUNITY)
Admission: AD | Admit: 2017-01-23 | Discharge: 2017-01-23 | Disposition: A | Discharge status: Home / Self Care | Payer: Medicaid Other | Source: Ambulatory Visit | Attending: Internal Medicine | Admitting: Internal Medicine

## 2017-01-23 VITALS — BP 123/91 | HR 88 | Temp 98.3°F | Resp 16 | Ht 67.0 in | Wt 126.0 lb

## 2017-01-23 DIAGNOSIS — G894 Chronic pain syndrome: Secondary | ICD-10-CM

## 2017-01-23 DIAGNOSIS — Z79891 Long term (current) use of opiate analgesic: Secondary | ICD-10-CM | POA: Diagnosis not present

## 2017-01-23 DIAGNOSIS — D5700 Hb-SS disease with crisis, unspecified: Secondary | ICD-10-CM | POA: Diagnosis present

## 2017-01-23 DIAGNOSIS — Z7982 Long term (current) use of aspirin: Secondary | ICD-10-CM | POA: Diagnosis not present

## 2017-01-23 DIAGNOSIS — O09521 Supervision of elderly multigravida, first trimester: Secondary | ICD-10-CM

## 2017-01-23 LAB — CBC WITH DIFFERENTIAL/PLATELET
Basophils Absolute: 0 10*3/uL (ref 0.0–0.1)
Basophils Relative: 0 %
Eosinophils Absolute: 0.2 10*3/uL (ref 0.0–0.7)
Eosinophils Relative: 1 %
HEMATOCRIT: 22.8 % — AB (ref 36.0–46.0)
Hemoglobin: 8.2 g/dL — ABNORMAL LOW (ref 12.0–15.0)
LYMPHS ABS: 1.7 10*3/uL (ref 0.7–4.0)
LYMPHS PCT: 10 %
MCH: 31.7 pg (ref 26.0–34.0)
MCHC: 36 g/dL (ref 30.0–36.0)
MCV: 88 fL (ref 78.0–100.0)
MONOS PCT: 12 %
Monocytes Absolute: 2 10*3/uL — ABNORMAL HIGH (ref 0.1–1.0)
NEUTROS ABS: 13.1 10*3/uL — AB (ref 1.7–7.7)
NEUTROS PCT: 77 %
Platelets: 510 10*3/uL — ABNORMAL HIGH (ref 150–400)
RBC: 2.59 MIL/uL — AB (ref 3.87–5.11)
RDW: 20.3 % — AB (ref 11.5–15.5)
WBC: 17 10*3/uL — AB (ref 4.0–10.5)
nRBC: 15 /100 WBC — ABNORMAL HIGH

## 2017-01-23 LAB — URINALYSIS, COMPLETE (UACMP) WITH MICROSCOPIC
Bilirubin Urine: NEGATIVE
Glucose, UA: NEGATIVE mg/dL
Ketones, ur: NEGATIVE mg/dL
Leukocytes, UA: NEGATIVE
NITRITE: NEGATIVE
Protein, ur: NEGATIVE mg/dL
SPECIFIC GRAVITY, URINE: 1.009 (ref 1.005–1.030)
pH: 6 (ref 5.0–8.0)

## 2017-01-23 MED ORDER — DIPHENHYDRAMINE HCL 25 MG PO CAPS: 25.0000 mg | ORAL_CAPSULE | ORAL | Status: DC | PRN

## 2017-01-23 MED ORDER — SODIUM CHLORIDE 0.9% FLUSH: 9.0000 mL | INTRAVENOUS | Status: DC | PRN

## 2017-01-23 MED ORDER — DEXTROSE-NACL 5-0.45 % IV SOLN
INTRAVENOUS | Status: DC
  Administered 2017-01-23: 12:00:00 via INTRAVENOUS

## 2017-01-23 MED ORDER — HYDROMORPHONE 1 MG/ML IV SOLN
INTRAVENOUS | Status: DC
  Administered 2017-01-23: 13:00:00 via INTRAVENOUS
  Administered 2017-01-23: 9 mg via INTRAVENOUS
  Filled 2017-01-23: qty 25

## 2017-01-23 MED ORDER — OXYCODONE HCL 10 MG PO TABS: 10.0000 mg | ORAL_TABLET | ORAL | 0 refills | Status: DC | PRN

## 2017-01-23 MED ORDER — SODIUM CHLORIDE 0.9 % IV SOLN
25.0000 mg | INTRAVENOUS | Status: DC | PRN
  Filled 2017-01-23: qty 0.5

## 2017-01-23 MED ORDER — NALOXONE HCL 0.4 MG/ML IJ SOLN: 0.4000 mg | INTRAMUSCULAR | Status: DC | PRN

## 2017-01-23 MED ORDER — ONDANSETRON HCL 4 MG/2ML IJ SOLN: 4.0000 mg | Freq: Four times a day (QID) | INTRAMUSCULAR | Status: DC | PRN

## 2017-01-23 MED FILL — oxyCODONE HCL 10 MG TABS: 10 | 15 days supply | Qty: 90 | Fill #0

## 2017-01-23 NOTE — Progress Notes (Signed)
Pt was received from the office side to be treated for sickle cell pain. Pt stated her pain was 8/10 and it was in her left arm. Pt was treated with rest, IV fluids and Dilaudid PCA. Her pain was down to 4/10 at discharge. She was alert, oriented and ambulatory at discharge. Discharge instructions given with verbal understanding. She was d/cd with her husband.

## 2017-01-23 NOTE — H&P (Signed)
[facility] History and Physical   Date: 01/23/2017  Patient name: [name] Medical record number: [id number] Date of birth: [birth year] Age: 46 y.o. Gender: female PCP: [name] Dew, FNP  Attending physician: [name] Garter, MD  Chief Complaint: Lower extremity pain  History of Present Illness: [name], a 46 year old female with a history of sickle cell anemia, HbSS presents complaining of pain to lower extremities. She says that she has been having pain intermittently over the past several weeks. She was last treated and evaluated in the emergency department on 01/21/2017, she says that pain decreased after receiving IV pain medications and returned the following day. [name] is [redacted] weeks pregnant and is followed frequently by obstetrician at high risk clinic. She last had Oxycodone on last night without sustained relief. Current pain intensity is 10/10. She denies chest pains, headache, shortness of breath, dysuria, abdominal pain, nausea, vomiting, or diarrhea.   Meds: Prescriptions Prior to Admission  Medication Sig Dispense Refill Last Dose  . aspirin EC 81 MG tablet Take 1 tablet (81 mg total) by mouth daily. Take after 12 weeks for prevention of preeclampsia later in pregnancy 300 tablet 2 01/22/2017 at Unknown time  . Cholecalciferol (VITAMIN D) 2000 units CAPS Take 2 capsules (4,000 Units total) by mouth daily. 30 capsule 11 01/22/2017 at Unknown time  . Clobetasol Prop Emollient Base (CLOBETASOL PROPIONATE E) 0.05 % emollient cream Apply 1 application topically 2 (two) times daily. 30 g 1 01/22/2017 at Unknown time  . folic acid (FOLVITE) 1 MG tablet Take 5 tablets (5 mg total) by mouth daily. 150 tablet 11 01/22/2017 at Unknown time  . Oxycodone HCl 10 MG TABS Take 1 tablet (10 mg total) by mouth every 4 (four) hours as needed (pain). 90 tablet 0 01/23/2017 at Unknown time  . Prenatal Multivit-Min-Fe-FA (PRENATAL VITAMINS) 0.8 MG tablet Take 1 tablet  by mouth daily. 30 tablet 1 01/22/2017 at Unknown time  . RESTA SILVER GEL Apply 1 application topically daily. Applies to leg   01/22/2017 at Unknown time    Allergies: Patient has no known allergies. Past Medical History:  Diagnosis Date  . Anemia 03/30/2012   Hx of sickle cell disease  . CAP (community acquired pneumonia)    2014  . Cholelithiasis y-1  . Elevated LFTs   . Leg ulcer ([place]) 10/27/2012   Chronic under care of wound clinic  . Multiple open wounds of lower extremity    chronic wounds B/LLE  . Sickle cell disease ([place])    Past Surgical History:  Procedure Laterality Date  . ALLOGRAFT APPLICATION Right 5/95/6387   Procedure: SURGICAL PREP FOR GRAFTING RIGHT LOWER EXTREMITY AND APPLICATION OF Jannifer Hick;  Surgeon: Irene Limbo, MD;  Location: [place];  Service: Plastics;  Laterality: Right;  . APPLICATION OF A-CELL OF EXTREMITY Right 10/09/2015   Procedure: APPLICATION OF Jannifer Hick;  Surgeon: Irene Limbo, MD;  Location: [place];  Service: Plastics;  Laterality: Right;  . BREAST SURGERY     left breast cyst aspiration  . CESAREAN SECTION    . CESAREAN SECTION N/A 01/06/2014   Procedure: CESAREAN SECTION;  Surgeon: Emily Filbert, MD;  Location: [facility];  Service: Obstetrics;  Laterality: N/A;  . I&D EXTREMITY Right 10/09/2015   Procedure: SURGICAL PREPARATION FOR GRAFTING RIGHT ANKLE AND APPLICATION THERASKIN;  Surgeon: Irene Limbo, MD;  Location: [place];  Service: Plastics;  Laterality: Right;  . SKIN FULL THICKNESS  GRAFT Bilateral 06/17/2016   Procedure: SURGICAL PREP FOR GRAFTING, BILATERAL LOWER EXTREMITIES AND APPLICATION OF Jannifer Hick;  Surgeon: Irene Limbo, MD;  Location: [place];  Service: Plastics;  Laterality: Bilateral;  . TONSILLECTOMY     Family History  Problem Relation Age of Onset  . Sickle cell anemia Sister   . Diabetes Mother   . Asthma Mother   . Sickle cell trait Mother   . Sickle cell  trait Father   . Hypertension Father    Social History   Social History  . Marital status: Married    Spouse name: [name]  . Number of children: 1  . Years of education: N/A   Occupational History  . Employed in home care.    Social History Main Topics  . Smoking status: Never Smoker  . Smokeless tobacco: Never Used  . Alcohol use No  . Drug use: No  . Sexual activity: Yes    Birth control/ protection: None   Other Topics Concern  . Not on file   Social History Narrative   Lives with husband.    Review of Systems: Review of Systems  Constitutional: Negative for chills and weight loss.  HENT: Negative.   Eyes: Negative.   Cardiovascular: Negative.   Gastrointestinal: Negative.   Genitourinary: Negative.   Musculoskeletal: Positive for joint pain and myalgias.  Skin: Negative.   Neurological: Negative.   Endo/Heme/Allergies: Negative.   Psychiatric/Behavioral: Negative.     Physical Exam: Blood pressure 117/66, pulse 83, temperature 98.7 F (37.1 C), temperature source Oral, resp. rate (!) 22, height 5\' 7"  (1.702 m), weight 126 lb (57.2 kg), last menstrual period 10/10/2016, SpO2 96 %. BP 117/71 (BP Location: Right Arm)   Pulse 78   Temp 98.7 F (37.1 C) (Oral)   Resp 11   Ht 5\' 7"  (1.702 m)   Wt 126 lb (57.2 kg)   LMP 10/10/2016 Comment: patient double shielded  SpO2 93%   BMI 19.73 kg/m   General Appearance:    Alert, cooperative, mild distress, appears stated age  Head:    Normocephalic, without obvious abnormality, atraumatic  Eyes:    PERRL, conjunctiva/corneas clear, EOM's intact, fundi    benign, both eyes  Ears:    Normal TM's and external ear canals, both ears  Nose:   Nares normal, septum midline, mucosa normal, no drainage    or sinus tenderness  Throat:   Lips, mucosa, and tongue normal; teeth and gums normal  Neck:   Supple, symmetrical, trachea midline, no adenopathy;    thyroid:  no enlargement/tenderness/nodules; no  carotid   bruit or JVD  Back:     Symmetric, no curvature, ROM normal, no CVA tenderness  Lungs:     Clear to auscultation bilaterally, respirations unlabored  Chest Wall:    No tenderness or deformity   Heart:    Regular rate and rhythm, S1 and S2 normal, no murmur, rub   or gallop  Abdomen:     Soft, non-tender, bowel sounds active all four quadrants,    no masses, no organomegaly  Extremities:   Extremities normal, atraumatic, no cyanosis or edema. Non draining ulcers to lower extremities   Pulses:   2+ and symmetric all extremities  Skin:   Skin color, texture, turgor normal, no rashes or lesions  Lymph nodes:   Cervical, supraclavicular, and axillary nodes normal  Neurologic:   CNII-XII intact, normal strength, sensation and reflexes    throughout    Lab results: Results for  orders placed or performed during the hospital encounter of 01/23/17 (from the past 24 hour(s))  CBC with Differential/Platelet     Status: Abnormal   Collection Time: 01/23/17 12:00 PM  Result Value Ref Range   WBC 17.0 (H) 4.0 - 10.5 K/uL   RBC 2.59 (L) 3.87 - 5.11 MIL/uL   Hemoglobin 8.2 (L) 12.0 - 15.0 g/dL   HCT 22.8 (L) 36.0 - 46.0 %   MCV 88.0 78.0 - 100.0 fL   MCH 31.7 26.0 - 34.0 pg   MCHC 36.0 30.0 - 36.0 g/dL   RDW 20.3 (H) 11.5 - 15.5 %   Platelets 510 (H) 150 - 400 K/uL   Neutrophils Relative % 77 %   Lymphocytes Relative 10 %   Monocytes Relative 12 %   Eosinophils Relative 1 %   Basophils Relative 0 %   nRBC 15 (H) 0 /100 WBC   Neutro Abs 13.1 (H) 1.7 - 7.7 K/uL   Lymphs Abs 1.7 0.7 - 4.0 K/uL   Monocytes Absolute 2.0 (H) 0.1 - 1.0 K/uL   Eosinophils Absolute 0.2 0.0 - 0.7 K/uL   Basophils Absolute 0.0 0.0 - 0.1 K/uL   RBC Morphology POLYCHROMASIA PRESENT     Imaging results:  No results found.   Assessment & Plan:  Patient will be admitted to the day infusion center for extended observation  Start IV D5.45 for cellular rehydration at 75/hr  Start Dilaudid PCA High  Concentration per weight based protocol.   Patient will be re-evaluated for pain intensity in the context of function and relationship to baseline as care progresses.  If no significant pain relief, will transfer patient to inpatient services for a higher level of care.   Reviewed labs from 01/21/2017, leukocytosis present, will re-check CBC   Kada Friesen M 01/23/2017, 3:30 PM

## 2017-01-23 NOTE — Progress Notes (Signed)
Pt received a Dilaudid PCA. 15mg  was wasted at discharge. Witnessed by Aida Puffer, RN

## 2017-01-23 NOTE — Discharge Instructions (Signed)
Please resume all home medications Will inquire about upcoming hematology appointment  Discussed the importance of drinking 64 ounces of water daily. The Importance of Water. To help prevent pain crises, it is important to drink plenty of water throughout the day. This is because dehydration of red blood cells may lead to the sickling process.    Follow up with obstetrician as scheduled  Will follow up by phone with any abnormal laborat  Sickle Cell Anemia, Adult Sickle cell anemia is a condition where your red blood cells are shaped like sickles. Red blood cells carry oxygen through the body. Sickle-shaped red blood cells do not live as long as normal red blood cells. They also clump together and block blood from flowing through the blood vessels. These things prevent the body from getting enough oxygen. Sickle cell anemia causes organ damage and pain. It also increases the risk of infection. Follow these instructions at home:  Drink enough fluid to keep your pee (urine) clear or pale yellow. Drink more in hot weather and during exercise.  Do not smoke. Smoking lowers oxygen levels in the blood.  Only take over-the-counter or prescription medicines as told by your doctor.  Take antibiotic medicines as told by your doctor. Make sure you finish them even if you start to feel better.  Take supplements as told by your doctor.  Consider wearing a medical alert bracelet. This tells anyone caring for you in an emergency of your condition.  When traveling, keep your medical information, doctors' names, and the medicines you take with you at all times.  If you have a fever, do not take fever medicines right away. This could cover up a problem. Tell your doctor.  Keep all follow-up visits with your doctor. Sickle cell anemia requires regular medical care. Contact a doctor if: You have a fever. Get help right away if:  You feel dizzy or faint.  You have new belly (abdominal) pain,  especially on the left side near the stomach area.  You have a lasting, often uncomfortable and painful erection of the penis (priapism). If it is not treated right away, you will become unable to have sex (impotence).  You have numbness in your arms or legs or you have a hard time moving them.  You have a hard time talking.  You have a fever or lasting symptoms for more than 2-3 days.  You have a fever and your symptoms suddenly get worse.  You have signs or symptoms of infection. These include: ? Chills. ? Being more tired than normal (lethargy). ? Irritability. ? Poor eating. ? Throwing up (vomiting).  You have pain that is not helped with medicine.  You have shortness of breath.  You have pain in your chest.  You are coughing up pus-like or bloody mucus.  You have a stiff neck.  Your feet or hands swell or have pain.  Your belly looks bloated.  Your joints hurt. This information is not intended to replace advice given to you by your health care provider. Make sure you discuss any questions you have with your health care provider. Document Released: 02/24/2013 Document Revised: 10/12/2015 Document Reviewed: 12/16/2012 Elsevier Interactive Patient Education  2017 [place] results

## 2017-01-23 NOTE — Discharge Summary (Signed)
[facility] Discharge Summary   Patient ID: [name] MRN: [id number] DOB/AGE: July 02, 1970 46 y.o.  Admit date: 01/23/2017 Discharge date: 01/23/2017  Primary Care Physician:  [name] Dew, FNP  Admission Diagnoses:  Active Problems:   Hb-SS disease with crisis [facility])   Discharge Diagnoses:   HbSS disease with crisis  Discharge Medications:  Allergies as of 01/23/2017   No Known Allergies     Medication List    TAKE these medications   aspirin EC 81 MG tablet Take 1 tablet (81 mg total) by mouth daily. Take after 12 weeks for prevention of preeclampsia later in pregnancy   Clobetasol Prop Emollient Base 0.05 % emollient cream Commonly known as:  CLOBETASOL PROPIONATE E Apply 1 application topically 2 (two) times daily.   folic acid 1 MG tablet Commonly known as:  FOLVITE Take 5 tablets (5 mg total) by mouth daily.   Oxycodone HCl 10 MG Tabs Take 1 tablet (10 mg total) by mouth every 4 (four) hours as needed (pain).   Prenatal Vitamins 0.8 MG tablet Take 1 tablet by mouth daily.   RESTA SILVER Gel Apply 1 application topically daily. Applies to leg   Vitamin D 2000 units Caps Take 2 capsules (4,000 Units total) by mouth daily.            Discharge Care Instructions        Start     Ordered   01/23/17 0000  Discharge patient    Question Answer Comment  Discharge disposition 01-Home or Self Care   Discharge patient date 01/23/2017      01/23/17 1554       Consults:  None  Significant Diagnostic Studies:  No results found.   [facility] Course: [name], a 46 year old female with a history of sickle cell anemia, HbSS presents complaining of pain to lower extremities. She says that she has been having pain intermittently over the past several weeks. She was last treated and evaluated in the emergency department on 01/21/2017, she says that pain decreased after receiving IV pain medications and returned the  following day. [name] is [redacted] weeks pregnant and is followed frequently by obstetrician. She last had Oxycodone on last night without sustained relief. Current pain intensity is 10/10. She denies chest pains, headache, shortness of breath, dysuria, abdominal pain, nausea, vomiting, or diarrhea.   Patient transitioned to the day infusion center for pain management and extended observation Reviewed labs, mild leukocytosis present. Patient hemodynamically stable.   Pain management:  D5.45 @ 75 ml/hr for cellular rehydration Dilaudid PCA per high concentration, weight based rapid redosing. [name] used a total of 9 mg with 19 demands and 18 deliveries.  Pain intensity decreased from 8-9/10 to 4/10. Patient says that she can manage at home on current medication regimen.  She will discharge home with family in stable condition.   Discharge instructions:  Resume all home medication Follow up with hematologist and obstetrician as scheduled  Discussed the importance of drinking 64 ounces of water daily. The Importance of Water. To help prevent pain crises, it is important to drink plenty of water throughout the day. This is because dehydration of red blood cells may lead to the sickling process.   The patient was given clear instructions to go to ER or return to medical center if symptoms do not improve, worsen or new problems develop. The patient verbalized understanding.    Physical Exam at Discharge:  BP 117/71 (BP Location: Right Arm)   Pulse 78   Temp 98.7 F (37.1 C) (Oral)   Resp 11   Ht 5\' 7"  (1.702 m)   Wt 126 lb (57.2 kg)   LMP 10/10/2016 Comment: patient double shielded  SpO2 93%   BMI 19.73 kg/m   General Appearance:    Alert, cooperative, no distress, appears stated age  Head:    Normocephalic, without obvious abnormality, atraumatic  Back:     Symmetric, no curvature, ROM normal, no CVA tenderness  Lungs:     Clear to auscultation bilaterally, respirations unlabored   Chest Wall:    No tenderness or deformity   Heart:    Regular rate and rhythm, S1 and S2 normal, no murmur, rub   or gallop  Abdomen:     Soft, non-tender, bowel sounds active all four quadrants,    no masses, no organomegaly   Disposition at Discharge: 01-Home or Self Care  Discharge Orders: Discharge Instructions    Discharge patient    Complete by:  As directed    Discharge disposition:  01-Home or Self Care   Discharge patient date:  01/23/2017      Condition at Discharge:   Stable  Time spent on Discharge:  Greater than 30 minutes.  Signed: Addasyn Mcbreen M 01/23/2017, 4:06 PM

## 2017-01-28 NOTE — Progress Notes (Signed)
Subjective:    Patient ID: [name], female    DOB: 11-02-1970, 46 y.o.   MRN: [id number]  HPI [name], a 46 year old female with a history of sickle cell anemia, HbSS and chronic leg ulcers  presents for a 1 month follow up. [name] is currently in 2nd trimester of pregnancy. She is followed by obstetrician consistentlly.  She is followed by fetal maternal medicine due to high risk pregnancy.  She had initial prenatal appointment with Dr. Arlina Robes on 12/16/2016. She also had appointment for genetic counseling. A referral was sent to hematology, but patient has been unable to get an appointment locally.   She is taking prenatal vitamins and folic acid consistently. She says that pain has been minimally controlled on Oxycodone 10 mg. She has had 8/10 pain primarily to lower extremities. She last had pain medication this am without sustained relief. She has chronic ulcers to right lower extremity and is followed by wound care weekly. She says that wounds are improving.  She currently denies headache, chest pain, shortness of breath, nausea, vomiting diarrhea, or constipation.   Past Medical History:  Diagnosis Date  . Anemia 03/30/2012   Hx of sickle cell disease  . CAP (community acquired pneumonia)    2014  . Cholelithiasis y-1  . Elevated LFTs   . Leg ulcer ([place]) 10/27/2012   Chronic under care of wound clinic  . Multiple open wounds of lower extremity    chronic wounds B/LLE  . Sickle cell disease ([place])    Immunization History  Administered Date(s) Administered  . Influenza,inj,Quad PF,6+ Mos 03/16/2013, 01/20/2014, 03/01/2015, 02/19/2016  . Pneumococcal Conjugate-13 12/14/2015  . Pneumococcal Polysaccharide-23 03/24/2014  . Tdap 11/25/2013   Social History   Social History  . Marital status: Married    Spouse name: [name]  . Number of children: 1  . Years of education: N/A   Occupational History  . Employed in home care.    Social History Main  Topics  . Smoking status: Never Smoker  . Smokeless tobacco: Never Used  . Alcohol use No  . Drug use: No  . Sexual activity: Yes    Birth control/ protection: None   Other Topics Concern  . Not on file   Social History Narrative   Lives with husband.  No Known Allergies  Review of Systems  Constitutional: Negative for fever.  HENT: Negative.   Eyes: Negative for visual disturbance.  Respiratory: Negative.   Cardiovascular: Negative.   Gastrointestinal: Negative.   Endocrine: Negative.  Negative for polydipsia, polyphagia and polyuria.  Musculoskeletal: Positive for myalgias.       Bilateral lower extremities  Skin: Negative.        Right lower extremity ulcers  Neurological: Negative.   Hematological: Negative.   Psychiatric/Behavioral: Negative.        Objective:   Physical Exam  Constitutional: She is oriented to person, place, and time. She appears well-nourished.  HENT:  Head: Normocephalic and atraumatic.  Right Ear: External ear normal.  Left Ear: External ear normal.  Nose: Nose normal.  Mouth/Throat: Oropharynx is clear and moist.  Eyes: Pupils are equal, round, and reactive to light. Conjunctivae and EOM are normal.  Neck: Normal range of motion. Neck supple.  Cardiovascular: Normal rate, normal heart sounds and intact distal pulses.   Pulmonary/Chest: Effort normal and breath sounds normal.  Abdominal: Soft. Bowel sounds are normal.  Musculoskeletal: Normal range of motion.  Neurological: She is alert and oriented  to person, place, and time. She has normal reflexes.  Skin: Skin is warm and dry.  Bilateral lower extremity ulcers, unable to examine due to dressings.       Psychiatric: She has a normal mood and affect. Her behavior is normal. Judgment and thought content normal.     BP (!) 123/91 (BP Location: Right Arm, Patient Position: Sitting, Cuff Size: Normal)   Pulse 88   Temp 98.3 F (36.8 C) (Oral)   Resp 16   Ht 5\' 7"  (1.702 m)   Wt  126 lb (57.2 kg)   LMP 10/10/2016 Comment: patient double shielded  SpO2 96%   BMI 19.73 kg/m      Assessment & Plan:  1. Hb-SS disease with crisis [facility]) Patient will transition to the day infusion center for pain management and extended observation.  Will continue the following treatment regimen:  Patient will be admitted to the day infusion center for extended observation Start IV D5.45 for cellular rehydration at 125/hr Start Dilaudid PCA High Concentration per weight based protocol.  Patient will be re-evaluated for pain intensity in the context of function and relationship to baseline as care progresses. If no significant pain relief, will transfer patient to inpatient services for a higher level of care.  Will review CMP, reticulocytes,  and CBC w/differential  Continue folic acid 1 mg daily to prevent aplastic bone marrow crises.   Pulmonary evaluation - Patient denies severe recurrent wheezes, shortness of breath with exercise, or persistent cough. If these symptoms develop, pulmonary function tests with spirometry will be ordered, and if abnormal, plan on referral to Pulmonology for further evaluation.  Cardiac - Routine screening for pulmonary hypertension is not recommended.   Eye - High risk of proliferative retinopathy. Annual eye exam with retinal exam recommended to patient.  Immunization status - Up to date with vaccinations  Acute and chronic painful episodes - We agreed on pain management regimen. Will continue Oxycodone 10 mg every 4 hours as needed.   We discussed that pt is to receive her Schedule II prescriptions only from [place]. Pt is also aware that the prescription history is available to [place] online through the [facility] CSRS. Controlled substance agreement signed previously. We reminded Ms. Betha Loa that all patients receiving Schedule II narcotics must be seen for follow within one month of prescription being requested. We reviewed the terms of our pain agreement,  including the need to keep medicines in a safe locked location away from children or pets, and the need to report excess sedation or constipation, measures to avoid constipation, and policies related to early refills and stolen prescriptions. According to the [place] [facility] program, we have reviewed details related to analgesia, adverse effects, aberrant behaviors.  Reviewed [place] Substance Reporting system prior to prescribing opiate medications. No inconsistencies noted.  - Oxycodone HCl 10 MG TABS; Take 1 tablet (10 mg total) by mouth every 4 (four) hours as needed (pain).  Dispense: 90 tablet; Refill: 0  2. Elderly multigravida in first trimester Patient to follow up with obstetrician and high risk clinic as scheduled.  Patient was unable to get an appointment with a local hematologist.  Due to risks of sickle cell in pregnancy, patient warrants a evaluation by hematology.  She underwent frequent transfusions during previous pregnancy in 2015. She may warrant blood transfusion to prevent severe anemia and/or to manage potential medical complications. Prophylactic transfusions in pregnant women with SCD have been shown to reduces perinatal mortality, neonatal death, and preterm death.  I will fax notes and send referral to [facility] Louretta Shorten at [facility].   3. Chronic pain syndrome - Oxycodone HCl 10 MG TABS; Take 1 tablet (10 mg total) by mouth every 4 (four) hours as needed (pain).  Dispense: 90 tablet; Refill: 0  RTC: 1 month for sickle cell anemia   [name]  MSN, FNP-C [facility] [address] [place], [place] [address] [phone]

## 2017-02-14 ENCOUNTER — Ambulatory Visit (INDEPENDENT_AMBULATORY_CARE_PROVIDER_SITE_OTHER): Payer: Medicaid Other | Admitting: Obstetrics & Gynecology

## 2017-02-14 VITALS — BP 120/74 | HR 84 | Wt 127.0 lb

## 2017-02-14 DIAGNOSIS — O99012 Anemia complicating pregnancy, second trimester: Secondary | ICD-10-CM

## 2017-02-14 DIAGNOSIS — D571 Sickle-cell disease 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: Secondary | ICD-10-CM

## 2017-02-14 DIAGNOSIS — Z23 Encounter for immunization: Secondary | ICD-10-CM

## 2017-02-14 DIAGNOSIS — O418X20 Other specified disorders of amniotic fluid and membranes, second trimester, not applicable or unspecified: Secondary | ICD-10-CM | POA: Diagnosis not present

## 2017-02-14 DIAGNOSIS — O4692 Antepartum hemorrhage, unspecified, second trimester: Secondary | ICD-10-CM

## 2017-02-14 DIAGNOSIS — O09522 Supervision of elderly multigravida, second trimester: Secondary | ICD-10-CM | POA: Diagnosis not present

## 2017-02-14 DIAGNOSIS — O418X10 Other specified disorders of amniotic fluid and membranes, first trimester, not applicable or unspecified: Secondary | ICD-10-CM

## 2017-02-14 DIAGNOSIS — Z8759 Personal history of other complications of pregnancy, childbirth and the puerperium: Secondary | ICD-10-CM

## 2017-02-14 DIAGNOSIS — O0990 Supervision of high risk pregnancy, unspecified, unspecified trimester: Secondary | ICD-10-CM

## 2017-02-14 DIAGNOSIS — O468X1 Other antepartum hemorrhage, first trimester: Secondary | ICD-10-CM | POA: Diagnosis not present

## 2017-02-14 DIAGNOSIS — O99019 Anemia complicating pregnancy, unspecified trimester: Principal | ICD-10-CM

## 2017-02-14 DIAGNOSIS — O0992 Supervision of high risk pregnancy, unspecified, second trimester: Secondary | ICD-10-CM | POA: Diagnosis not present

## 2017-02-14 MED FILL — FOLIC ACID 1 MG TABLET: 1 | 30 days supply | Qty: 150 | Fill #1

## 2017-02-14 NOTE — Patient Instructions (Signed)
Return to clinic for any scheduled appointments or obstetric concerns, or go to [facility] for evaluation    Second Trimester of Pregnancy The second trimester is from week 14 through week 27 (months 4 through 6). The second trimester is often a time when you feel your best. Your body has adjusted to being pregnant, and you begin to feel better physically. Usually, morning sickness has lessened or quit completely, you may have more energy, and you may have an increase in appetite. The second trimester is also a time when the fetus is growing rapidly. At the end of the sixth month, the fetus is about 9 inches long and weighs about 1 pounds. You will likely begin to feel the baby move (quickening) between 16 and 20 weeks of pregnancy. Body changes during your second trimester Your body continues to go through many changes during your second trimester. The changes vary from woman to woman.  Your weight will continue to increase. You will notice your lower abdomen bulging out.  You may begin to get stretch marks on your hips, abdomen, and breasts.  You may develop headaches that can be relieved by medicines. The medicines should be approved by your health care provider.  You may urinate more often because the fetus is pressing on your bladder.  You may develop or continue to have heartburn as a result of your pregnancy.  You may develop constipation because certain hormones are causing the muscles that push waste through your intestines to slow down.  You may develop hemorrhoids or swollen, bulging veins (varicose veins).  You may have back pain. This is caused by: ? Weight gain. ? Pregnancy hormones that are relaxing the joints in your pelvis. ? A shift in weight and the muscles that support your balance.  Your breasts will continue to grow and they will continue to become tender.  Your gums may bleed and may be sensitive to brushing and flossing.  Dark spots or blotches (chloasma, mask of  pregnancy) may develop on your face. This will likely fade after the baby is born.  A dark line from your belly button to the pubic area (linea nigra) may appear. This will likely fade after the baby is born.  You may have changes in your hair. These can include thickening of your hair, rapid growth, and changes in texture. Some women also have hair loss during or after pregnancy, or hair that feels dry or thin. Your hair will most likely return to normal after your baby is born.  What to expect at prenatal visits During a routine prenatal visit:  You will be weighed to make sure you and the fetus are growing normally.  Your blood pressure will be taken.  Your abdomen will be measured to track your baby's growth.  The fetal heartbeat will be listened to.  Any test results from the previous visit will be discussed.  Your health care provider may ask you:  How you are feeling.  If you are feeling the baby move.  If you have had any abnormal symptoms, such as leaking fluid, bleeding, severe headaches, or abdominal cramping.  If you are using any tobacco products, including cigarettes, chewing tobacco, and electronic cigarettes.  If you have any questions.  Other tests that may be performed during your second trimester include:  Blood tests that check for: ? Low iron levels (anemia). ? High blood sugar that affects pregnant women (gestational diabetes) between 17 and 28 weeks. ? Rh antibodies. This is to  check for a protein on red blood cells (Rh factor).  Urine tests to check for infections, diabetes, or protein in the urine.  An ultrasound to confirm the proper growth and development of the baby.  An amniocentesis to check for possible genetic problems.  Fetal screens for spina bifida and Down syndrome.  HIV (human immunodeficiency virus) testing. Routine prenatal testing includes screening for HIV, unless you choose not to have this test.  Follow these instructions at  home: Medicines  Follow your health care provider's instructions regarding medicine use. Specific medicines may be either safe or unsafe to take during pregnancy.  Take a prenatal vitamin that contains at least 600 micrograms (mcg) of folic acid.  If you develop constipation, try taking a stool softener if your health care provider approves. Eating and drinking  Eat a balanced diet that includes fresh fruits and vegetables, whole grains, good sources of protein such as meat, eggs, or tofu, and low-fat dairy. Your health care provider will help you determine the amount of weight gain that is right for you.  Avoid raw meat and uncooked cheese. These carry germs that can cause birth defects in the baby.  If you have low calcium intake from food, talk to your health care provider about whether you should take a daily calcium supplement.  Limit foods that are high in fat and processed sugars, such as fried and sweet foods.  To prevent constipation: ? Drink enough fluid to keep your urine clear or pale yellow. ? Eat foods that are high in fiber, such as fresh fruits and vegetables, whole grains, and beans. Activity  Exercise only as directed by your health care provider. Most women can continue their usual exercise routine during pregnancy. Try to exercise for 30 minutes at least 5 days a week. Stop exercising if you experience uterine contractions.  Avoid heavy lifting, wear low heel shoes, and practice good posture.  A sexual relationship may be continued unless your health care provider directs you otherwise. Relieving pain and discomfort  Wear a good support bra to prevent discomfort from breast tenderness.  Take warm sitz baths to soothe any pain or discomfort caused by hemorrhoids. Use hemorrhoid cream if your health care provider approves.  Rest with your legs elevated if you have leg cramps or low back pain.  If you develop varicose veins, wear support hose. Elevate your feet  for 15 minutes, 3-4 times a day. Limit salt in your diet. Prenatal Care  Write down your questions. Take them to your prenatal visits.  Keep all your prenatal visits as told by your health care provider. This is important. Safety  Wear your seat belt at all times when driving.  Make a list of emergency phone numbers, including numbers for family, friends, the hospital, and police and fire departments. General instructions  Ask your health care provider for a referral to a local prenatal education class. Begin classes no later than the beginning of month 6 of your pregnancy.  Ask for help if you have counseling or nutritional needs during pregnancy. Your health care provider can offer advice or refer you to specialists for help with various needs.  Do not use hot tubs, steam rooms, or saunas.  Do not douche or use tampons or scented sanitary pads.  Do not cross your legs for long periods of time.  Avoid cat litter boxes and soil used by cats. These carry germs that can cause birth defects in the baby and possibly loss of the  fetus by miscarriage or stillbirth.  Avoid all smoking, herbs, alcohol, and unprescribed drugs. Chemicals in these products can affect the formation and growth of the baby.  Do not use any products that contain nicotine or tobacco, such as cigarettes and e-cigarettes. If you need help quitting, ask your health care provider.  Visit your dentist if you have not gone yet during your pregnancy. Use a soft toothbrush to brush your teeth and be gentle when you floss. Contact a health care provider if:  You have dizziness.  You have mild pelvic cramps, pelvic pressure, or nagging pain in the abdominal area.  You have persistent nausea, vomiting, or diarrhea.  You have a bad smelling vaginal discharge.  You have pain when you urinate. Get help right away if:  You have a fever.  You are leaking fluid from your vagina.  You have spotting or bleeding from your  vagina.  You have severe abdominal cramping or pain.  You have rapid weight gain or weight loss.  You have shortness of breath with chest pain.  You notice sudden or extreme swelling of your face, hands, ankles, feet, or legs.  You have not felt your baby move in over an hour.  You have severe headaches that do not go away when you take medicine.  You have vision changes. Summary  The second trimester is from week 14 through week 27 (months 4 through 6). It is also a time when the fetus is growing rapidly.  Your body goes through many changes during pregnancy. The changes vary from woman to woman.  Avoid all smoking, herbs, alcohol, and unprescribed drugs. These chemicals affect the formation and growth your baby.  Do not use any tobacco products, such as cigarettes, chewing tobacco, and e-cigarettes. If you need help quitting, ask your health care provider.  Contact your health care provider if you have any questions. Keep all prenatal visits as told by your health care provider. This is important. This information is not intended to replace advice given to you by your health care provider. Make sure you discuss any questions you have with your health care provider. Document Released: 04/30/2001 Document Revised: 10/12/2015 Document Reviewed: 07/07/2012 Elsevier Interactive Patient Education  2017 [employer].

## 2017-02-14 NOTE — Progress Notes (Signed)
   PRENATAL VISIT NOTE  Subjective:  [name] is a 46 y.o. [id number] at [redacted]w[redacted]d being seen today for ongoing prenatal care.  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  Patient reports having some small amount of bleeding last week. Has periodic small amount of bleeding since first trimester, was diagnosed with subchorionic hemorrhage then. No current bleeding.  Contractions: Not present. Vag. Bleeding: Small.  Movement: Absent. Denies leaking of fluid.   The following portions of the patient's history were reviewed and updated as appropriate: allergies, current medications, past family history, past medical history, past social history, past surgical history and problem list. Problem list updated.  Objective:   Vitals:   02/14/17 0905  BP: 120/74  Pulse: 84  Weight: 127 lb (57.6 kg)    Fetal Status: Fetal Heart Rate (bpm): 156   Movement: Absent     General:  Alert, oriented and cooperative. Patient is in no acute distress.  Skin: Skin is warm and dry. No rash noted.   Cardiovascular: Normal heart rate noted  Respiratory: Normal respiratory effort, no problems with respiration noted  Abdomen: Soft, gravid, appropriate for gestational age.  Pain/Pressure: Present     Pelvic: Cervical exam deferred        Extremities: Normal range of motion.  Edema: None  Mental Status:  Normal mood and affect. Normal behavior. Normal judgment and thought content.   Assessment and Plan:    Pregnancy: [id number] at [redacted]w[redacted]d  1. Vaginal bleeding in pregnancy, second trimester 2. Subchorionic hematoma in first trimester, single or unspecified fetus Will continue to monitor closely, bleeding precautions reviewed. Will follow up ultrasound report; scheduled on 02/20/17 for placenta status  3. 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   4. History of pre-eclampsia Continue ASA, stable BP.  5. Elderly multigravida in second trimester Quad screen done today.  - AFP TETRA  6. Supervision of high risk pregnancy, antepartum - Flu Vaccine QUAD 36+ mos IM No other complaints or concerns.  Routine obstetric precautions reviewed. Please refer to After Visit Summary for other counseling recommendations.  Return in about 4 weeks (around 03/14/2017) for OB Visit ([place]).   [name], MD

## 2017-02-16 ENCOUNTER — Encounter (HOSPITAL_COMMUNITY): Payer: Self-pay | Admitting: Emergency Medicine

## 2017-02-16 ENCOUNTER — Emergency Department (HOSPITAL_COMMUNITY)
Admission: EM | Admit: 2017-02-16 | Discharge: 2017-02-16 | Disposition: A | Discharge status: Home / Self Care | Payer: Medicaid Other | Attending: Emergency Medicine | Admitting: Emergency Medicine

## 2017-02-16 DIAGNOSIS — Z79899 Other long term (current) drug therapy: Secondary | ICD-10-CM | POA: Insufficient documentation

## 2017-02-16 DIAGNOSIS — N939 Abnormal uterine and vaginal bleeding, unspecified: Secondary | ICD-10-CM | POA: Diagnosis not present

## 2017-02-16 DIAGNOSIS — Z7982 Long term (current) use of aspirin: Secondary | ICD-10-CM | POA: Insufficient documentation

## 2017-02-16 DIAGNOSIS — D57219 Sickle-cell/Hb-C disease with crisis, unspecified: Secondary | ICD-10-CM | POA: Diagnosis not present

## 2017-02-16 DIAGNOSIS — D5700 Hb-SS disease with crisis, unspecified: Secondary | ICD-10-CM

## 2017-02-16 LAB — CBC WITH DIFFERENTIAL/PLATELET
BAND NEUTROPHILS: 7 %
BASOS ABS: 0.2 10*3/uL — AB (ref 0.0–0.1)
BLASTS: 0 %
Basophils Relative: 1 %
EOS ABS: 0 10*3/uL (ref 0.0–0.7)
Eosinophils Relative: 0 %
HEMATOCRIT: 20.9 % — AB (ref 36.0–46.0)
HEMOGLOBIN: 7.7 g/dL — AB (ref 12.0–15.0)
LYMPHS PCT: 4 %
Lymphs Abs: 0.9 10*3/uL (ref 0.7–4.0)
MCH: 32.6 pg (ref 26.0–34.0)
MCHC: 36.8 g/dL — AB (ref 30.0–36.0)
MCV: 88.6 fL (ref 78.0–100.0)
MYELOCYTES: 0 %
Metamyelocytes Relative: 0 %
Monocytes Absolute: 1.1 10*3/uL — ABNORMAL HIGH (ref 0.1–1.0)
Monocytes Relative: 5 %
Neutro Abs: 20.7 10*3/uL — ABNORMAL HIGH (ref 1.7–7.7)
Neutrophils Relative %: 83 %
OTHER: 0 %
PROMYELOCYTES ABS: 0 %
Platelets: 396 10*3/uL (ref 150–400)
RBC: 2.36 MIL/uL — ABNORMAL LOW (ref 3.87–5.11)
RDW: 21.9 % — ABNORMAL HIGH (ref 11.5–15.5)
WBC: 22.9 10*3/uL — ABNORMAL HIGH (ref 4.0–10.5)
nRBC: 53 /100 WBC — ABNORMAL HIGH

## 2017-02-16 LAB — COMPREHENSIVE METABOLIC PANEL
ALBUMIN: 3.5 g/dL (ref 3.5–5.0)
ALT: 21 U/L (ref 14–54)
AST: 52 U/L — AB (ref 15–41)
Alkaline Phosphatase: 71 U/L (ref 38–126)
Anion gap: 9 (ref 5–15)
BILIRUBIN TOTAL: 3 mg/dL — AB (ref 0.3–1.2)
BUN: <5 mg/dL — ABNORMAL LOW (ref 6–20)
CALCIUM: 9.2 mg/dL (ref 8.9–10.3)
CHLORIDE: 105 mmol/L (ref 101–111)
CO2: 22 mmol/L (ref 22–32)
Creatinine, Ser: <0.3 mg/dL — ABNORMAL LOW (ref 0.44–1.00)
GLUCOSE: 108 mg/dL — AB (ref 65–99)
POTASSIUM: 3.3 mmol/L — AB (ref 3.5–5.1)
Sodium: 136 mmol/L (ref 135–145)
TOTAL PROTEIN: 7.5 g/dL (ref 6.5–8.1)

## 2017-02-16 LAB — RETICULOCYTES
RBC.: 2.36 MIL/uL — AB (ref 3.87–5.11)
Retic Ct Pct: >23 % — ABNORMAL HIGH (ref 0.4–3.1)

## 2017-02-16 MED ORDER — HYDROMORPHONE HCL 1 MG/ML IJ SOLN
1.0000 mg | INTRAMUSCULAR | Status: AC
  Administered 2017-02-16: 1 mg via INTRAVENOUS
  Filled 2017-02-16: qty 1

## 2017-02-16 MED ORDER — HYDROMORPHONE HCL 1 MG/ML IJ SOLN
0.5000 mg | INTRAMUSCULAR | Status: AC
  Administered 2017-02-16: 0.5 mg via INTRAVENOUS

## 2017-02-16 MED ORDER — POTASSIUM CHLORIDE CRYS ER 20 MEQ PO TBCR
40.0000 meq | EXTENDED_RELEASE_TABLET | Freq: Once | ORAL | Status: AC
  Administered 2017-02-16: 40 meq via ORAL
  Filled 2017-02-16: qty 2

## 2017-02-16 MED ORDER — SODIUM CHLORIDE 0.9 % IV SOLN
INTRAVENOUS | Status: DC
  Administered 2017-02-16: 11:00:00 via INTRAVENOUS

## 2017-02-16 MED ORDER — SODIUM CHLORIDE 0.9 % IV BOLUS (SEPSIS)
1000.0000 mL | Freq: Once | INTRAVENOUS | Status: AC
  Administered 2017-02-16: 1000 mL via INTRAVENOUS

## 2017-02-16 MED ORDER — HYDROMORPHONE HCL 1 MG/ML IJ SOLN: 1.0000 mg | INTRAMUSCULAR | Status: AC

## 2017-02-16 MED ORDER — DIPHENHYDRAMINE HCL 50 MG/ML IJ SOLN
25.0000 mg | Freq: Once | INTRAMUSCULAR | Status: AC
  Administered 2017-02-16: 25 mg via INTRAVENOUS
  Filled 2017-02-16: qty 1

## 2017-02-16 MED ORDER — HYDROMORPHONE HCL 1 MG/ML IJ SOLN: 0.5000 mg | INTRAMUSCULAR | Status: AC

## 2017-02-16 MED ORDER — HYDROMORPHONE HCL 1 MG/ML IJ SOLN
1.0000 mg | INTRAMUSCULAR | Status: AC
  Filled 2017-02-16: qty 1

## 2017-02-16 NOTE — ED Provider Notes (Addendum)
[place] DEPT Provider Note   CSN: [id number] Arrival date & time: 02/16/17  0549     History   Chief Complaint Chief Complaint  Patient presents with  . Sickle Cell Pain Crisis    HPI [name] is a 46 y.o. female.  45 year old female who is currently [redacted] weeks pregnant and has a history of sickle cell disease presents with her usual pain crisis in her legs. Denies any vaginal bleeding or discharge. Denies any uterine cramping. No fever, cough, dyspnea or shortness of breath. No trauma to her legs. Denies any dysuria or hematuria. Pain is been persistent and unrelieved with her home opiates. States that when she gets this way, she can usually received 3 doses of IV pain medication and go home.      Past Medical History:  Diagnosis Date  . Anemia 03/30/2012   Hx of sickle cell disease  . CAP (community acquired pneumonia)    2014  . Cholelithiasis y-1  . Elevated LFTs   . Leg ulcer ([place]) 10/27/2012   Chronic under care of wound clinic  . Multiple open wounds of lower extremity    chronic wounds B/LLE  . Sickle cell disease [facility])     Patient Active Problem List   Diagnosis Date Noted  . Subchorionic hematoma in first trimester 12/23/2016  . Supervision of high risk pregnancy, antepartum 12/16/2016  . History of pre-eclampsia 12/16/2016  . 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 Hb-SS disease with crisis ([place]) 08/16/2016  . Hb-SS disease 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 ([place]) 03/24/2014  . Advanced maternal age in multigravida 07/28/2013  . Previous cesarean delivery x 2 07/22/2013  . Hemochromatosis 11/10/2012  . Chronic pain syndrome 10/13/2012  . Sinus bradycardia by electrocardiogram 04/03/2012  . Chronic wound of extremity 04/01/2012  . Cholelithiasis 03/30/2012  . Leukocytosis 03/30/2012    Past Surgical History:  Procedure Laterality Date  . ALLOGRAFT APPLICATION Right 4/58/0998   Procedure: SURGICAL PREP FOR GRAFTING RIGHT LOWER  EXTREMITY AND APPLICATION OF Jannifer Hick;  Surgeon: Irene Limbo, MD;  Location: [place];  Service: Plastics;  Laterality: Right;  . APPLICATION OF A-CELL OF EXTREMITY Right 10/09/2015   Procedure: APPLICATION OF Jannifer Hick;  Surgeon: Irene Limbo, MD;  Location: [place];  Service: Plastics;  Laterality: Right;  . BREAST SURGERY     left breast cyst aspiration  . CESAREAN SECTION    . CESAREAN SECTION N/A 01/06/2014   Procedure: CESAREAN SECTION;  Surgeon: Emily Filbert, MD;  Location: [facility];  Service: Obstetrics;  Laterality: N/A;  . I&D EXTREMITY Right 10/09/2015   Procedure: SURGICAL PREPARATION FOR GRAFTING RIGHT ANKLE AND APPLICATION THERASKIN;  Surgeon: Irene Limbo, MD;  Location: [place];  Service: Plastics;  Laterality: Right;  . SKIN FULL THICKNESS GRAFT Bilateral 06/17/2016   Procedure: SURGICAL PREP FOR GRAFTING, BILATERAL LOWER EXTREMITIES AND APPLICATION OF Jannifer Hick;  Surgeon: Irene Limbo, MD;  Location: [place];  Service: Plastics;  Laterality: Bilateral;  . TONSILLECTOMY      OB History    Gravida Para Term Preterm AB Living   8 4 1 3 2 2    SAB TAB Ectopic Multiple Live Births   2       3       Home Medications    Prior to Admission medications   Medication Sig Start Date End Date Taking? Authorizing Provider  aspirin EC 81 MG tablet Take 1 tablet (81 mg total) by mouth daily.  Take after 12 weeks for prevention of preeclampsia later in pregnancy 01/15/17   Anyanwu, Sallyanne Havers, MD  Cholecalciferol (VITAMIN D) 2000 units CAPS Take 2 capsules (4,000 Units total) by mouth daily. 06/03/16   [name], FNP  Clobetasol Prop Emollient Base (CLOBETASOL PROPIONATE E) 0.05 % emollient cream Apply 1 application topically 2 (two) times daily. 12/20/16   [name], FNP  folic acid (FOLVITE) 1 MG tablet Take 5 tablets (5 mg total) by mouth daily. 01/15/17   Anyanwu, Sallyanne Havers, MD  Oxycodone HCl 10 MG TABS Take 1  tablet (10 mg total) by mouth every 4 (four) hours as needed (pain). 01/23/17   [name], FNP  Prenatal Multivit-Min-Fe-FA (PRENATAL VITAMINS) 0.8 MG tablet Take 1 tablet by mouth daily. 11/14/16   Leana Gamer, MD  RESTA SILVER GEL Apply 1 application topically daily. Applies to leg    [provider]    Family History Family History  Problem Relation Age of Onset  . Sickle cell anemia Sister   . Diabetes Mother   . Asthma Mother   . Sickle cell trait Mother   . Sickle cell trait Father   . Hypertension Father     Social History Social History  Substance Use Topics  . Smoking status: Never Smoker  . Smokeless tobacco: Never Used  . Alcohol use No     Allergies   Patient has no known allergies.   Review of Systems Review of Systems  All other systems reviewed and are negative.    Physical Exam Updated Vital Signs BP 137/86 (BP Location: Right Arm)   Pulse (!) 102   Temp 98.7 F (37.1 C) (Oral)   Resp 16   Ht 1.702 m (5\' 7" )   Wt 57.6 kg (127 lb)   LMP 10/10/2016 Comment: patient double shielded  SpO2 (!) 89%   BMI 19.89 kg/m   Physical Exam  Constitutional: She is oriented to person, place, and time. She appears well-developed and well-nourished.  Non-toxic appearance. No distress.  HENT:  Head: Normocephalic and atraumatic.  Eyes: Pupils are equal, round, and reactive to light. Conjunctivae, EOM and lids are normal.  Neck: Normal range of motion. Neck supple. No tracheal deviation present. No thyroid mass present.  Cardiovascular: Normal rate, regular rhythm and normal heart sounds.  Exam reveals no gallop.   No murmur heard. Pulmonary/Chest: Effort normal and breath sounds normal. No stridor. No respiratory distress. She has no decreased breath sounds. She has no wheezes. She has no rhonchi. She has no rales.  Abdominal: Soft. Normal appearance and bowel sounds are normal. She exhibits no distension. There is no tenderness. There is  no rebound and no CVA tenderness.  Musculoskeletal: Normal range of motion. She exhibits no edema or tenderness.  Neurological: She is alert and oriented to person, place, and time. She has normal strength. No cranial nerve deficit or sensory deficit. GCS eye subscore is 4. GCS verbal subscore is 5. GCS motor subscore is 6.  Skin: Skin is warm and dry. No abrasion and no rash noted.  Psychiatric: She has a normal mood and affect. Her speech is normal and behavior is normal.  Nursing note and vitals reviewed.    ED Treatments / Results  Labs (all labs ordered are listed, but only abnormal results are displayed) Labs Reviewed  COMPREHENSIVE METABOLIC PANEL  CBC WITH DIFFERENTIAL/PLATELET  RETICULOCYTES    EKG  EKG Interpretation None       Radiology No  results found.  Procedures Procedures (including critical care time)  Medications Ordered in ED Medications  HYDROmorphone (DILAUDID) injection 0.5 mg (not administered)    Or  HYDROmorphone (DILAUDID) injection 0.5 mg (not administered)  HYDROmorphone (DILAUDID) injection 1 mg (not administered)    Or  HYDROmorphone (DILAUDID) injection 1 mg (not administered)  HYDROmorphone (DILAUDID) injection 1 mg (not administered)    Or  HYDROmorphone (DILAUDID) injection 1 mg (not administered)  HYDROmorphone (DILAUDID) injection 1 mg (not administered)    Or  HYDROmorphone (DILAUDID) injection 1 mg (not administered)  diphenhydrAMINE (BENADRYL) injection 25 mg (not administered)     Initial Impression / Assessment and Plan / ED Course  I have reviewed the triage vital signs and the nursing notes.  Pertinent labs & imaging results that were available during my care of the patient were reviewed by me and considered in my medical decision making (see chart for details).     Patient medicated for pain here and does feel better. Did develop some vaginal bleeding which when I reviewed the patient's old records shows that she has  been recently diagnosed with a subchorionic hemorrhage. I spoke with the nurse midwife at [facility] hospital, lisa leftwich-kirby, recommends that because the patient has no uterine contractions, has fetal heart tones in the 145, and this is unchanged from her prior recent multiple episodes of vaginal bleeding for which she did see her Dr. For the same symptoms 2 days ago, the patient can be followed as an outpatient. She further recommends that no pelvic exam be performed since the possibility of the previous has not been ruled out.Potassium is 3.3 and will give the patient oral potassium.  Final Clinical Impressions(s) / ED Diagnoses   Final diagnoses:  None    New Prescriptions New Prescriptions   No medications on file     Lacretia Leigh, MD 02/16/17 1359    Lacretia Leigh, MD 02/16/17 1400

## 2017-02-16 NOTE — ED Triage Notes (Signed)
Patient complaining of sickle cell crisis. Patient states the pain is in her right knee. Patient states the last time she took her medication is three am. Patient states she has no other symptoms.

## 2017-02-16 NOTE — ED Notes (Signed)
Pt having pain in the right knee, inconsistent with her usual sickle cell pain. [redacted] weeks pregnant.

## 2017-02-16 NOTE — Discharge Instructions (Signed)
[facility] if you develop abdominal pain, different vaginal bleeding than what you have been experiencing, or any other problems

## 2017-02-18 ENCOUNTER — Encounter: Payer: Self-pay | Admitting: Obstetrics & Gynecology

## 2017-02-18 DIAGNOSIS — O280 Abnormal hematological finding on antenatal screening of mother: Secondary | ICD-10-CM | POA: Insufficient documentation

## 2017-02-18 LAB — AFP TETRA
DIA Mom Value: 4.64
DIA Value (EIA): 869.61 pg/mL
DSR (BY AGE) 1 IN: 17
DSR (SECOND TRIMESTER) 1 IN: 11
Gestational Age: 18.1 WEEKS
MATERNAL AGE AT EDD: 45.9 a
MSAFP Mom: 7.94
MSAFP: 424.6 ng/mL
MSHCG MOM: 1.98
MSHCG: 60537 m[IU]/mL
Osb Risk: 10
T18 (By Age): 1:65 {titer}
TEST RESULTS AFP: POSITIVE — AB
Weight: 127 [lb_av]
uE3 Mom: 0.7
uE3 Value: 0.95 ng/mL

## 2017-02-20 ENCOUNTER — Other Ambulatory Visit: Payer: Self-pay | Admitting: Obstetrics & Gynecology

## 2017-02-20 ENCOUNTER — Ambulatory Visit (HOSPITAL_COMMUNITY)
Admission: RE | Admit: 2017-02-20 | Discharge: 2017-02-20 | Disposition: A | Discharge status: Home / Self Care | Payer: Medicaid Other | Source: Ambulatory Visit | Attending: Obstetrics & Gynecology | Admitting: Obstetrics & Gynecology

## 2017-02-20 ENCOUNTER — Encounter (HOSPITAL_COMMUNITY): Payer: Self-pay

## 2017-02-20 ENCOUNTER — Other Ambulatory Visit (HOSPITAL_COMMUNITY): Payer: Self-pay | Admitting: *Deleted

## 2017-02-20 DIAGNOSIS — Z3A19 19 weeks gestation of pregnancy: Secondary | ICD-10-CM

## 2017-02-20 DIAGNOSIS — O09299 Supervision of pregnancy with other poor reproductive or obstetric history, unspecified trimester: Secondary | ICD-10-CM

## 2017-02-20 DIAGNOSIS — D571 Sickle-cell disease 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: Secondary | ICD-10-CM

## 2017-02-20 DIAGNOSIS — O09522 Supervision of elderly multigravida, second trimester: Secondary | ICD-10-CM | POA: Insufficient documentation

## 2017-02-20 DIAGNOSIS — Z315 Encounter for genetic counseling: Secondary | ICD-10-CM | POA: Insufficient documentation

## 2017-02-20 DIAGNOSIS — O99019 Anemia complicating pregnancy, unspecified trimester: Principal | ICD-10-CM

## 2017-02-20 DIAGNOSIS — O99012 Anemia complicating pregnancy, second trimester: Secondary | ICD-10-CM | POA: Diagnosis not present

## 2017-02-20 DIAGNOSIS — O09529 Supervision of elderly multigravida, unspecified trimester: Secondary | ICD-10-CM

## 2017-02-20 DIAGNOSIS — O34211 Maternal care for low transverse scar from previous cesarean delivery: Secondary | ICD-10-CM | POA: Insufficient documentation

## 2017-02-20 DIAGNOSIS — O289 Unspecified abnormal findings on antenatal screening of mother: Secondary | ICD-10-CM | POA: Diagnosis not present

## 2017-02-20 DIAGNOSIS — O4402 Placenta previa specified as without hemorrhage, second trimester: Secondary | ICD-10-CM

## 2017-02-20 DIAGNOSIS — Z363 Encounter for antenatal screening for malformations: Secondary | ICD-10-CM

## 2017-02-20 DIAGNOSIS — Z3689 Encounter for other specified antenatal screening: Secondary | ICD-10-CM

## 2017-02-20 DIAGNOSIS — O280 Abnormal hematological finding on antenatal screening of mother: Secondary | ICD-10-CM

## 2017-02-20 DIAGNOSIS — O09212 Supervision of pregnancy with history of pre-term labor, second trimester: Secondary | ICD-10-CM | POA: Insufficient documentation

## 2017-02-20 DIAGNOSIS — O09292 Supervision of pregnancy with other poor reproductive or obstetric history, second trimester: Secondary | ICD-10-CM | POA: Diagnosis not present

## 2017-02-20 DIAGNOSIS — O358XX0 Maternal care for other (suspected) fetal abnormality and damage, not applicable or unspecified: Secondary | ICD-10-CM | POA: Diagnosis not present

## 2017-02-20 NOTE — Progress Notes (Signed)
Genetic Counseling  High-Risk Gestation Note  Appointment Date:  02/20/2017 Referred By: [name], FNP Date of Birth:  10-11-1970   Pregnancy History: [id number] Estimated Date of Delivery: 07/17/17 Estimated Gestational Age: [redacted]w[redacted]d Attending: Renella Cunas, MD   [name] was seen for follow-up genetic counseling because of an increased risk for fetal Down syndrome and open neural tube defects based on Quad screening through [facility]. See previously genetic counseling note from 12/19/16 for previous detailed discussion in pregnancy.   In summary:  Reviewed results of Quad screening test  Increased risk for Down syndrome (1 in 11)  Elevated MSAFP (7.94 MoM) - 1 in 10 ONTD risk  Elevated MSAFP and DIA associated with increased risk for adverse pregnancy outcomes  Discussed additional screening options  NIPS- elected to pursue Panorama today  Ultrasound- Visualized fetal anatomy within normal limits today  Discussed diagnostic testing options  Amniocentesis- declined   We reviewed [name] maternal serum screening result, the elevation of MSAFP (7.94 MoM), and the associated 1 in 10 risk for a fetal open neural tube defect.   We reviewed open neural tube defects including: the typical multifactorial etiology and variable prognosis.  In addition, we discussed alternative explanations for an elevated MSAFP including: normal variation, twins, feto-maternal bleeding, a gestational dating error, abdominal wall defects, kidney differences, oligohydramnios, and placental problems.  We discussed that an unexplained elevation of MSAFP as well as elevation of DIA is associated with an increased risk for third trimester complications including: prematurity, low birth weight, and pre-eclampsia.    She was counseled regarding the screening result and the associated 1 in 11 risk for fetal Down syndrome (adjusted from her age related risk of 1 in 79).  We reviewed chromosomes,  nondisjunction, and the common features and variable prognosis of Down syndrome. Please refer to previous genetic counseling note for documentation of previous detailed discussion of chromosomes and chromosome conditions.  In addition, we reviewed the screen adjusted reduction in risks for trisomy 79.  We also discussed other explanations for a screen positive result including: a gestational dating error, differences in maternal metabolism, and normal variation.  We reviewed other available screening options including noninvasive prenatal screening (NIPS)/cell free DNA (cfDNA) screening, and detailed ultrasound.  She was counseled that screening tests are used to modify a patient's a priori risk for aneuploidy, typically based on age. This estimate provides a pregnancy specific risk assessment. We reviewed the benefits and limitations of each option. Specifically, we discussed the conditions for which each test screens, the detection rates, and false positive rates of each. She was counseled that 80-90% of fetuses with open neural tube defects can be detected by detailed second trimester ultrasound, when well visualized.She was also counseled regarding diagnostic testing via amniocentesis. We reviewed the approximate 1 in 867-619 risk for complications from amniocentesis, including spontaneous pregnancy loss. We discussed the possible results that the tests might provide including: positive, negative, unanticipated, and no result. Finally, they were counseled regarding the cost of each option and potential out of pocket expenses. After consideration of all the options, she elected to proceed with NIPS (Panorama) today.  Those results will be available in 8-10 days.    A complete ultrasound was performed today. The ultrasound report will be sent under separate cover. There were no visualized fetal anomalies or markers suggestive of aneuploidy. Diagnostic testing was declined today.  She understands that screening  tests cannot rule out all birth defects or genetic syndromes. The patient was  advised of this limitation and states she still does not want additional testing at this time.   I counseled [name] for approximately 20 minutes regarding the above risks and available options.   Chipper Oman, MS,  Certified Genetic Counselor 02/20/2017

## 2017-02-21 ENCOUNTER — Encounter (HOSPITAL_COMMUNITY): Payer: Medicaid Other

## 2017-02-21 ENCOUNTER — Ambulatory Visit (INDEPENDENT_AMBULATORY_CARE_PROVIDER_SITE_OTHER): Payer: Medicaid Other | Admitting: Family Medicine

## 2017-02-21 ENCOUNTER — Encounter: Payer: Self-pay | Admitting: Obstetrics & Gynecology

## 2017-02-21 ENCOUNTER — Other Ambulatory Visit: Payer: Self-pay

## 2017-02-21 VITALS — BP 116/71 | HR 85 | Temp 98.2°F | Resp 16 | Ht 67.0 in | Wt 128.0 lb

## 2017-02-21 DIAGNOSIS — O09522 Supervision of elderly multigravida, second trimester: Secondary | ICD-10-CM

## 2017-02-21 DIAGNOSIS — D5700 Hb-SS disease with crisis, unspecified: Secondary | ICD-10-CM

## 2017-02-21 DIAGNOSIS — O4420 Partial placenta previa NOS or without hemorrhage, unspecified trimester: Secondary | ICD-10-CM | POA: Insufficient documentation

## 2017-02-21 DIAGNOSIS — O4690 Antepartum hemorrhage, unspecified, unspecified trimester: Secondary | ICD-10-CM

## 2017-02-21 DIAGNOSIS — D571 Sickle-cell disease 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: Secondary | ICD-10-CM

## 2017-02-21 DIAGNOSIS — G894 Chronic pain syndrome: Secondary | ICD-10-CM | POA: Diagnosis not present

## 2017-02-21 DIAGNOSIS — O99019 Anemia complicating pregnancy, unspecified trimester: Secondary | ICD-10-CM

## 2017-02-21 LAB — CBC
HCT: 21.5 % — ABNORMAL LOW (ref 35.0–45.0)
HEMOGLOBIN: 7.6 g/dL — AB (ref 11.7–15.5)
MCH: 33 pg (ref 27.0–33.0)
MCHC: 35.3 g/dL (ref 32.0–36.0)
MCV: 93.5 fL (ref 80.0–100.0)
MPV: 10.4 fL (ref 7.5–12.5)
PLATELETS: 437 10*3/uL — AB (ref 140–400)
RBC: 2.3 10*6/uL — AB (ref 3.80–5.10)
RDW: 20.2 % — ABNORMAL HIGH (ref 11.0–15.0)
WBC: 16.3 10*3/uL — AB (ref 3.8–10.8)

## 2017-02-21 MED ORDER — OXYCODONE HCL 10 MG PO TABS: 10.0000 mg | ORAL_TABLET | ORAL | 0 refills | Status: DC | PRN

## 2017-02-21 MED FILL — oxyCODONE HCL 10 MG TABS: 10 | 15 days supply | Qty: 90 | Fill #0

## 2017-02-21 NOTE — Patient Instructions (Addendum)
Continue Oxycodone 10 mg every four hours for moderate to severe pain  Will follow up after Dr. Marin Olp reviews case    Discussed the importance of drinking 64 ounces of water daily. The Importance of Water. To help prevent pain crises, it is important to drink plenty of water throughout the day. This is because dehydration of red blood cells may lead to the sickling process.     Sickle Cell Anemia, Adult Sickle cell anemia is a condition where your red blood cells are shaped like sickles. Red blood cells carry oxygen through the body. Sickle-shaped red blood cells do not live as long as normal red blood cells. They also clump together and block blood from flowing through the blood vessels. These things prevent the body from getting enough oxygen. Sickle cell anemia causes organ damage and pain. It also increases the risk of infection. Follow these instructions at home:  Drink enough fluid to keep your pee (urine) clear or pale yellow. Drink more in hot weather and during exercise.  Do not smoke. Smoking lowers oxygen levels in the blood.  Only take over-the-counter or prescription medicines as told by your doctor.  Take antibiotic medicines as told by your doctor. Make sure you finish them even if you start to feel better.  Take supplements as told by your doctor.  Consider wearing a medical alert bracelet. This tells anyone caring for you in an emergency of your condition.  When traveling, keep your medical information, doctors' names, and the medicines you take with you at all times.  If you have a fever, do not take fever medicines right away. This could cover up a problem. Tell your doctor.  Keep all follow-up visits with your doctor. Sickle cell anemia requires regular medical care. Contact a doctor if: You have a fever. Get help right away if:  You feel dizzy or faint.  You have new belly (abdominal) pain, especially on the left side near the stomach area.  You have a  lasting, often uncomfortable and painful erection of the penis (priapism). If it is not treated right away, you will become unable to have sex (impotence).  You have numbness in your arms or legs or you have a hard time moving them.  You have a hard time talking.  You have a fever or lasting symptoms for more than 2-3 days.  You have a fever and your symptoms suddenly get worse.  You have signs or symptoms of infection. These include: ? Chills. ? Being more tired than normal (lethargy). ? Irritability. ? Poor eating. ? Throwing up (vomiting).  You have pain that is not helped with medicine.  You have shortness of breath.  You have pain in your chest.  You are coughing up pus-like or bloody mucus.  You have a stiff neck.  Your feet or hands swell or have pain.  Your belly looks bloated.  Your joints hurt. This information is not intended to replace advice given to you by your health care provider. Make sure you discuss any questions you have with your health care provider. Document Released: 02/24/2013 Document Revised: 10/12/2015 Document Reviewed: 12/16/2012 Elsevier Interactive Patient Education  2017 [employer].

## 2017-02-21 NOTE — Progress Notes (Signed)
Subjective:    Patient ID: [name], female    DOB: 03-05-1971, 46 y.o.   MRN: [id number]  HPI [name], a 46 year old female with a history of sickle cell anemia, HbSS and chronic leg ulcers  presents for a 1 month follow up. [name] is currently in 2nd trimester of pregnancy. She is followed by obstetrician consistentlly. She last had appointment with obstetrician, [name], on 02/14/2017.  She is also followed by fetal maternal medicine due to high risk pregnancy.   She also had appointment for genetic counseling on 02/20/2017. A referral was sent to hematology, but patient has been unable to get an appointment locally. She has an appointment scheduled with [facility] on April 07, 2017.    She is taking prenatal vitamins and folic acid consistently. She says that pain has been minimally controlled on Oxycodone 10 mg. She has had 8/10 pain primarily to lower extremities. She last had pain medication this am without sustained relief. She has chronic ulcers to right lower extremity and is followed by wound care weekly. Patient was evaluated in the emergency department on 02/16/2017 for pain management.  She says that wounds are improving.  She currently denies headache, chest pain, shortness of breath, nausea, vomiting diarrhea, or constipation.   Past Medical History:  Diagnosis Date  . Anemia 03/30/2012   Hx of sickle cell disease  . CAP (community acquired pneumonia)    2014  . Cholelithiasis y-1  . Cholelithiasis 03/30/2012  . Chronic wound of extremity 04/01/2012  . Elevated LFTs   . Leg ulcer ([place]) 10/27/2012   Chronic under care of wound clinic  . Multiple open wounds of lower extremity    chronic wounds B/LLE  . Sickle cell disease ([place])   . Sinus bradycardia by electrocardiogram 04/03/2012   Immunization History  Administered Date(s) Administered  . Influenza,inj,Quad PF,6+ Mos 03/16/2013, 01/20/2014, 03/01/2015, 02/19/2016, 02/14/2017  . Pneumococcal  Conjugate-13 12/14/2015  . Pneumococcal Polysaccharide-23 03/24/2014  . Tdap 11/25/2013   Social History   Social History  . Marital status: Married    Spouse name: [name]  . Number of children: 1  . Years of education: N/A   Occupational History  . Employed in home care.    Social History Main Topics  . Smoking status: Never Smoker  . Smokeless tobacco: Never Used  . Alcohol use No  . Drug use: No  . Sexual activity: Yes    Birth control/ protection: None   Other Topics Concern  . Not on file   Social History Narrative   Lives with husband.  No Known Allergies  Review of Systems  Constitutional: Negative for fever.  HENT: Negative.   Eyes: Negative for visual disturbance.  Respiratory: Negative.   Cardiovascular: Negative.   Gastrointestinal: Negative.   Endocrine: Negative.  Negative for polydipsia, polyphagia and polyuria.  Musculoskeletal: Positive for myalgias.       Bilateral lower extremities  Skin: Negative.        Right lower extremity ulcers  Neurological: Negative.   Hematological: Negative.   Psychiatric/Behavioral: Negative.        Objective:   Physical Exam  Constitutional: She is oriented to person, place, and time. She appears well-nourished.  HENT:  Head: Normocephalic and atraumatic.  Right Ear: External ear normal.  Left Ear: External ear normal.  Nose: Nose normal.  Mouth/Throat: Oropharynx is clear and moist.  Eyes: Pupils are equal, round, and reactive to light. Conjunctivae and EOM are normal.  Neck: Normal range of motion. Neck supple.  Cardiovascular: Normal rate, normal heart sounds and intact distal pulses.   Pulmonary/Chest: Effort normal and breath sounds normal.  Abdominal: Soft. Bowel sounds are normal.  Musculoskeletal: Normal range of motion.  Neurological: She is alert and oriented to person, place, and time. She has normal reflexes.  Skin: Skin is warm and dry.  Bilateral lower extremity ulcers, unable to  examine due to dressings.       Psychiatric: She has a normal mood and affect. Her behavior is normal. Judgment and thought content normal.     BP 116/71 (BP Location: Right Arm, Patient Position: Sitting, Cuff Size: Normal)   Pulse 85   Temp 98.2 F (36.8 C) (Oral)   Resp 16   Ht 5\' 7"  (1.702 m)   Wt 128 lb (58.1 kg)   LMP 10/10/2016 Comment: patient double shielded  SpO2 96%   BMI 20.05 kg/m      Assessment & Plan:  1. Hb-SS disease with crisis [facility]) Will continue the following treatment regimen:   Continue folic acid 1 mg daily to prevent aplastic bone marrow crises.   Pulmonary evaluation - Patient denies severe recurrent wheezes, shortness of breath with exercise, or persistent cough. If these symptoms develop, pulmonary function tests with spirometry will be ordered, and if abnormal, plan on referral to Pulmonology for further evaluation.  Cardiac - Routine screening for pulmonary hypertension is not recommended.   Eye - High risk of proliferative retinopathy. Annual eye exam with retinal exam recommended to patient.  Immunization status - Up to date with vaccinations  Acute and chronic painful episodes - We agreed on pain management regimen. Will continue Oxycodone 10 mg every 4 hours as needed.   We discussed that pt is to receive her Schedule II prescriptions only from [place]. Pt is also aware that the prescription history is available to [place] online through the [facility] CSRS. Controlled substance agreement signed previously. We reminded [name] that all patients receiving Schedule II narcotics must be seen for follow within one month of prescription being requested. We reviewed the terms of our pain agreement, including the need to keep medicines in a safe locked location away from children or pets, and the need to report excess sedation or constipation, measures to avoid constipation, and policies related to early refills and stolen prescriptions. According to the [place] [facility] program, we have reviewed details related to analgesia, adverse effects, aberrant behaviors.  Reviewed  Substance Reporting system prior to prescribing opiate medications. No inconsistencies noted.  - Oxycodone HCl 10 MG TABS; Take 1 tablet (10 mg total) by mouth every 4 (four) hours as needed (pain).  Dispense: 90 tablet; Refill: 0  2. Elderly multigravida in second trimester Patient to follow up with obstetrician and high risk clinic as scheduled.  Patient was unable to get an appointment with a local hematologist.  Due to risks of sickle cell in pregnancy, patient warrants a evaluation by hematology.  She underwent frequent transfusions during previous pregnancy in 2015. She may warrant blood transfusion to prevent severe anemia and/or to manage potential medical complications. Prophylactic transfusions in pregnant women with SCD have been shown to reduces perinatal mortality, neonatal death, and preterm death. I will fax notes and send referral to Dr. Louretta Shorten at [facility].   3. Chronic pain syndrome - Oxycodone HCl 10 MG TABS; Take 1 tablet (10 mg total) by mouth every 4 (four) hours as needed (pain).  Dispense:  90 tablet; Refill: 0  4. Vaginal bleeding in pregnancy Patient was evaluated by obstetrician on 02/14/2017. She has history of subchorionic hematoma and placenta previa She is scheduled to follow up around 03/14/2017  5. Sickle cell anemia of mother during pregnancy [facility]) During previous pregnancy, patient was followed closely by hematology. Her son was delivered by c-section 3 years ago and is currently healthy.  [name] has had several pain crises since August that have resulted in emergency room visits. She is know to have chronic leg ulcers and a history of avascular necrosis of hips. She had numerous transfusions during previous pregnancy without complications. Hemoglobin is currently 7.7. Patient's hemoglobin was maintained above 8 during  previous pregnancy. Will contact Dr. Alvy Bimler for further advice.   - Hemoglobinopathy Evaluation - CBC RTC: 1 month for sickle cell anemia   [name]  MSN, FNP-C Patient [place] [address] [place], [place] [id number] [phone]

## 2017-02-24 ENCOUNTER — Encounter: Payer: Self-pay | Admitting: Family Medicine

## 2017-02-25 ENCOUNTER — Telehealth (HOSPITAL_COMMUNITY): Payer: Self-pay | Admitting: MS"

## 2017-02-25 LAB — HEMOGLOBINOPATHY EVALUATION
Fetal Hemoglobin Testing: 8.2 % — ABNORMAL HIGH (ref 0.0–1.9)
HCT: 22.2 % — ABNORMAL LOW (ref 35.0–45.0)
HGB A: 0 % — AB (ref >96.0)
HGB S QUANTITAION: 87.2 % — AB
Hemoglobin A2 - HGBRFX: 4.6 % — ABNORMAL HIGH (ref 1.8–3.5)
Hemoglobin: 7.5 g/dL — ABNORMAL LOW (ref 11.7–15.5)
MCH: 32.1 pg (ref 27.0–33.0)
MCV: 94.9 fL (ref 80.0–100.0)
RBC: 2.34 10*6/uL — AB (ref 3.80–5.10)
RDW: 16.4 % — ABNORMAL HIGH (ref 11.0–15.0)

## 2017-02-25 NOTE — Telephone Encounter (Signed)
Called [name] to discuss her prenatal cell free DNA test results, Panorama through [facility] laboratory, which are within normal limits. Left message for patient to return call.   [facility] Merrit Waugh Certified [employer] 02/25/2017 12:32 PM

## 2017-02-25 NOTE — Telephone Encounter (Signed)
Called [name] to discuss her prenatal cell free DNA test results.  [name] had Panorama testing through [place] [facility] laboratories.  Testing was offered because of advanced maternal age and abnormal maternal serum screen.   The patient was identified by name and DOB.  We reviewed that these are within normal limits, showing a less than 1 in 10,000 risk for trisomies 21, 18 and 13, and monosomy X (Turner syndrome).  In addition, the risk for triploidy and sex chromosome trisomies (47,XXX and 47,XXY) was also low risk. We reviewed that this testing identifies > 99% of pregnancies with trisomy 70, trisomy 31, sex chromosome trisomies (47,XXX and 47,XXY), and triploidy. The detection rate for trisomy 18 is 96%.  The detection rate for monosomy X is ~92%.  The false positive rate is <0.1% for all conditions. Testing was also consistent with female fetal sex.  The patient did wish to know fetal sex.  She understands that this testing does not identify all genetic conditions.  All questions were answered to her satisfaction, she was encouraged to call with additional questions or concerns.  Chipper Oman, MS Certified Genetic Counselor 02/25/2017 12:38 PM

## 2017-02-28 ENCOUNTER — Other Ambulatory Visit (HOSPITAL_COMMUNITY): Payer: Self-pay

## 2017-03-02 ENCOUNTER — Emergency Department (HOSPITAL_BASED_OUTPATIENT_CLINIC_OR_DEPARTMENT_OTHER): Admit: 2017-03-02 | Discharge: 2017-03-02 | Disposition: A | Discharge status: Home / Self Care | Payer: Medicaid Other

## 2017-03-02 ENCOUNTER — Encounter (HOSPITAL_COMMUNITY): Payer: Self-pay | Admitting: Emergency Medicine

## 2017-03-02 ENCOUNTER — Inpatient Hospital Stay (HOSPITAL_COMMUNITY)
Admission: EM | Admit: 2017-03-02 | Discharge: 2017-03-11 | DRG: 831 | Disposition: A | Discharge status: Home / Self Care | Payer: Medicaid Other | Attending: Internal Medicine | Admitting: Internal Medicine

## 2017-03-02 DIAGNOSIS — D5700 Hb-SS disease with crisis, unspecified: Secondary | ICD-10-CM | POA: Diagnosis present

## 2017-03-02 DIAGNOSIS — M79609 Pain in unspecified limb: Secondary | ICD-10-CM | POA: Diagnosis not present

## 2017-03-02 DIAGNOSIS — Z3492 Encounter for supervision of normal pregnancy, unspecified, second trimester: Secondary | ICD-10-CM | POA: Diagnosis not present

## 2017-03-02 DIAGNOSIS — D638 Anemia in other chronic diseases classified elsewhere: Secondary | ICD-10-CM

## 2017-03-02 DIAGNOSIS — E876 Hypokalemia: Secondary | ICD-10-CM | POA: Diagnosis present

## 2017-03-02 DIAGNOSIS — O468X2 Other antepartum hemorrhage, second trimester: Secondary | ICD-10-CM | POA: Diagnosis present

## 2017-03-02 DIAGNOSIS — R7989 Other specified abnormal findings of blood chemistry: Secondary | ICD-10-CM | POA: Diagnosis present

## 2017-03-02 DIAGNOSIS — K5900 Constipation, unspecified: Secondary | ICD-10-CM | POA: Diagnosis not present

## 2017-03-02 DIAGNOSIS — D72829 Elevated white blood cell count, unspecified: Secondary | ICD-10-CM | POA: Diagnosis not present

## 2017-03-02 DIAGNOSIS — O99012 Anemia complicating pregnancy, second trimester: Principal | ICD-10-CM | POA: Diagnosis present

## 2017-03-02 DIAGNOSIS — L97919 Non-pressure chronic ulcer of unspecified part of right lower leg with unspecified severity: Secondary | ICD-10-CM | POA: Diagnosis present

## 2017-03-02 DIAGNOSIS — Z3A20 20 weeks gestation of pregnancy: Secondary | ICD-10-CM | POA: Diagnosis not present

## 2017-03-02 DIAGNOSIS — O4402 Placenta previa specified as without hemorrhage, second trimester: Secondary | ICD-10-CM | POA: Diagnosis not present

## 2017-03-02 DIAGNOSIS — Z3A21 21 weeks gestation of pregnancy: Secondary | ICD-10-CM | POA: Diagnosis not present

## 2017-03-02 LAB — COMPREHENSIVE METABOLIC PANEL
ALBUMIN: 3.3 g/dL — AB (ref 3.5–5.0)
ALK PHOS: 74 U/L (ref 38–126)
ALT: 22 U/L (ref 14–54)
AST: 56 U/L — AB (ref 15–41)
Anion gap: 7 (ref 5–15)
BUN: <5 mg/dL — ABNORMAL LOW (ref 6–20)
CALCIUM: 9 mg/dL (ref 8.9–10.3)
CHLORIDE: 108 mmol/L (ref 101–111)
CO2: 22 mmol/L (ref 22–32)
GLUCOSE: 101 mg/dL — AB (ref 65–99)
Potassium: 3.5 mmol/L (ref 3.5–5.1)
SODIUM: 137 mmol/L (ref 135–145)
Total Bilirubin: 3.3 mg/dL — ABNORMAL HIGH (ref 0.3–1.2)
Total Protein: 7.2 g/dL (ref 6.5–8.1)

## 2017-03-02 LAB — CBC WITH DIFFERENTIAL/PLATELET
BASOS ABS: 0 10*3/uL (ref 0.0–0.1)
Basophils Relative: 0 %
EOS ABS: 0.4 10*3/uL (ref 0.0–0.7)
Eosinophils Relative: 2 %
HCT: 20.4 % — ABNORMAL LOW (ref 36.0–46.0)
HEMOGLOBIN: 7.6 g/dL — AB (ref 12.0–15.0)
LYMPHS PCT: 16 %
Lymphs Abs: 3.1 10*3/uL (ref 0.7–4.0)
MCH: 32.5 pg (ref 26.0–34.0)
MCHC: 37.3 g/dL — ABNORMAL HIGH (ref 30.0–36.0)
MCV: 87.2 fL (ref 78.0–100.0)
MONO ABS: 2.3 10*3/uL — AB (ref 0.1–1.0)
MONOS PCT: 12 %
NEUTROS PCT: 70 %
NRBC: 17 /100{WBCs} — AB
Neutro Abs: 13.5 10*3/uL — ABNORMAL HIGH (ref 1.7–7.7)
PLATELETS: 440 10*3/uL — AB (ref 150–400)
RBC: 2.34 MIL/uL — AB (ref 3.87–5.11)
RDW: 21.1 % — ABNORMAL HIGH (ref 11.5–15.5)
WBC: 19.3 10*3/uL — ABNORMAL HIGH (ref 4.0–10.5)

## 2017-03-02 LAB — RETICULOCYTES
RBC.: 2.34 MIL/uL — ABNORMAL LOW (ref 3.87–5.11)
Retic Ct Pct: >23 % — ABNORMAL HIGH (ref 0.4–3.1)

## 2017-03-02 MED ORDER — NALOXONE HCL 0.4 MG/ML IJ SOLN
0.4000 mg | INTRAMUSCULAR | Status: DC | PRN
Start: 2017-03-02 — End: 2017-03-11

## 2017-03-02 MED ORDER — FOLIC ACID 1 MG PO TABS
5.0000 mg | ORAL_TABLET | Freq: Every day | ORAL | Status: DC
  Administered 2017-03-02 – 2017-03-11 (×10): 5 mg via ORAL
  Filled 2017-03-02 (×10): qty 5

## 2017-03-02 MED ORDER — POLYETHYLENE GLYCOL 3350 17 G PO PACK
17.0000 g | PACK | Freq: Every day | ORAL | Status: DC | PRN
  Administered 2017-03-07 – 2017-03-10 (×5): 17 g via ORAL
  Filled 2017-03-02 (×5): qty 1

## 2017-03-02 MED ORDER — DEXTROSE-NACL 5-0.45 % IV SOLN
INTRAVENOUS | Status: DC
  Administered 2017-03-02 – 2017-03-09 (×10): via INTRAVENOUS

## 2017-03-02 MED ORDER — HYDROMORPHONE HCL 1 MG/ML IJ SOLN
1.0000 mg | Freq: Once | INTRAMUSCULAR | Status: AC
  Administered 2017-03-02: 1 mg via INTRAVENOUS
  Filled 2017-03-02: qty 1

## 2017-03-02 MED ORDER — ONDANSETRON HCL 4 MG/2ML IJ SOLN: 4.0000 mg | Freq: Four times a day (QID) | INTRAMUSCULAR | Status: DC | PRN

## 2017-03-02 MED ORDER — SODIUM CHLORIDE 0.45 % IV SOLN: INTRAVENOUS | Status: DC

## 2017-03-02 MED ORDER — CLOBETASOL PROPIONATE 0.05 % EX CREA
1.0000 "application " | TOPICAL_CREAM | Freq: Two times a day (BID) | CUTANEOUS | Status: DC
  Administered 2017-03-02 – 2017-03-11 (×18): 1 via TOPICAL
  Filled 2017-03-02: qty 15

## 2017-03-02 MED ORDER — SENNOSIDES-DOCUSATE SODIUM 8.6-50 MG PO TABS
1.0000 | ORAL_TABLET | Freq: Two times a day (BID) | ORAL | Status: DC
  Administered 2017-03-02 – 2017-03-11 (×16): 1 via ORAL
  Filled 2017-03-02 (×17): qty 1

## 2017-03-02 MED ORDER — VITAMIN D3 25 MCG (1000 UNIT) PO TABS
4000.0000 [IU] | ORAL_TABLET | Freq: Every day | ORAL | Status: DC
  Administered 2017-03-02 – 2017-03-11 (×10): 4000 [IU] via ORAL
  Filled 2017-03-02 (×10): qty 4

## 2017-03-02 MED ORDER — SODIUM CHLORIDE 0.9% FLUSH: 9.0000 mL | INTRAVENOUS | Status: DC | PRN

## 2017-03-02 MED ORDER — SODIUM CHLORIDE 0.45 % IV SOLN
INTRAVENOUS | Status: DC
  Administered 2017-03-02: 10:00:00 via INTRAVENOUS

## 2017-03-02 MED ORDER — ENOXAPARIN SODIUM 40 MG/0.4ML ~~LOC~~ SOLN
40.0000 mg | SUBCUTANEOUS | Status: DC
  Administered 2017-03-02 – 2017-03-10 (×8): 40 mg via SUBCUTANEOUS
  Filled 2017-03-02 (×8): qty 0.4

## 2017-03-02 MED ORDER — HYDROMORPHONE 1 MG/ML IV SOLN
INTRAVENOUS | Status: DC
  Administered 2017-03-02: 5.6 mg via INTRAVENOUS
  Administered 2017-03-02 – 2017-03-03 (×2): 25 mg via INTRAVENOUS
  Administered 2017-03-03: 6.8 mg via INTRAVENOUS
  Administered 2017-03-03: 3.4 mg via INTRAVENOUS
  Administered 2017-03-03: 6.3 mg via INTRAVENOUS
  Administered 2017-03-03: 3.6 mg via INTRAVENOUS
  Administered 2017-03-03: 4.8 mg via INTRAVENOUS
  Filled 2017-03-02 (×2): qty 25

## 2017-03-02 MED ORDER — DIPHENHYDRAMINE HCL 25 MG PO CAPS
25.0000 mg | ORAL_CAPSULE | ORAL | Status: DC | PRN
  Administered 2017-03-03 – 2017-03-04 (×2): 50 mg via ORAL
  Administered 2017-03-10: 25 mg via ORAL
  Filled 2017-03-02: qty 1
  Filled 2017-03-02 (×2): qty 2

## 2017-03-02 MED ORDER — PRENATAL MULTIVITAMIN CH
1.0000 | ORAL_TABLET | Freq: Every day | ORAL | Status: DC
Start: 2017-03-03 — End: 2017-03-11
  Administered 2017-03-03 – 2017-03-11 (×9): 1 via ORAL
  Filled 2017-03-02 (×9): qty 1

## 2017-03-02 MED ORDER — PRENATAL VITAMINS 0.8 MG PO TABS: 1.0000 | ORAL_TABLET | Freq: Every day | ORAL | Status: DC

## 2017-03-02 MED ORDER — OXYCODONE HCL 5 MG PO TABS
10.0000 mg | ORAL_TABLET | Freq: Once | ORAL | Status: AC
  Administered 2017-03-02: 10 mg via ORAL
  Filled 2017-03-02: qty 2

## 2017-03-02 MED ORDER — HYDROMORPHONE HCL 1 MG/ML IJ SOLN
0.5000 mg | Freq: Once | INTRAMUSCULAR | Status: AC
  Administered 2017-03-02: 0.5 mg via INTRAVENOUS
  Filled 2017-03-02: qty 1

## 2017-03-02 MED ORDER — SODIUM CHLORIDE 0.9 % IV SOLN
25.0000 mg | INTRAVENOUS | Status: DC | PRN
  Filled 2017-03-02: qty 0.5

## 2017-03-02 MED ORDER — RESTA SILVER EX GEL: 1.0000 "application " | Freq: Every day | CUTANEOUS | Status: DC

## 2017-03-02 MED ORDER — ASPIRIN EC 81 MG PO TBEC
81.0000 mg | DELAYED_RELEASE_TABLET | Freq: Every day | ORAL | Status: DC
  Administered 2017-03-03 – 2017-03-11 (×9): 81 mg via ORAL
  Filled 2017-03-02 (×9): qty 1

## 2017-03-02 MED ORDER — VITAMIN D 50 MCG (2000 UT) PO CAPS: 2.0000 | ORAL_CAPSULE | Freq: Every day | ORAL | Status: DC

## 2017-03-02 NOTE — ED Provider Notes (Signed)
comPlains of right lower extremity pain, typical of sickle cell crisis that she is expressing the past. Pain worse with weightbearing no shortness of breath no chest pain no abdominal pain. No other associated symptoms. No treatment prior to coming here. She's been seen at the sickle cell clinic in the past while pregnant. Treated with hydromorphone. Other associated symptoms include vaginal discharge for 3 months which is unchanged. On exam alert appears uncomfortable Glasgow Coma Score 15 lungs clear to auscultation heart regular rate and rhythm abdomen nondistended nontender. Right lower extremitywith scarring.No appreciable swelling or redness, diffusely tender. DP pulse 2+ good capillary refill. All other extremity is without redness swelling or tenderness neurovascular intact  2:30 PM pain not appreciatebly improved after 3 doses of intravenous hydromorphone. I spoke with Dr. Roselie Awkward, [facility] on-call who suggestedadmission to medical service as pregnancy is not yet viable. I consulted Dr. Zigmund Daniel from sickle cell servicewho will arrange for overnight stay   Orlie Dakin, MD 03/02/17 1447

## 2017-03-02 NOTE — ED Triage Notes (Signed)
Pt is c/o right leg pain that started around 3am this morning  Pt states she has sickle cell

## 2017-03-02 NOTE — H&P (Addendum)
Hospital Admission Note Date: 03/02/2017  Patient name: [name] Medical record number: [id number] Date of birth: [birth year] Age: 46 y.o. Gender: female PCP: [name] Dew, FNP  Attending physician: [name] Gamer, MD  Chief Complaint: Pain in RLE since midnight.  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 Pt reports that she had a sudden onset of pain at midnight last night which was throbbing in nature and characteristic of Sickle Cell Crisis. She denies any fevers, chills, vomiting, CP, dizziness, SOB or syncope. Currently she rates the pain as 10/10 and localized to the RLE.   In the ED she received 3 doses of Dilaudid roughly 1 hour apart but has not received any further medication for the last 3 hours. She also had a Duplex U/S of the RLE performed which was negative for DVT. I am asked to admit patient for sickle cell crisis.  Scheduled Meds: Continuous Infusions: . sodium chloride 150 mL/hr at 03/02/17 1026  . dextrose 5 % and 0.45% NaCl     PRN Meds:. Allergies: Patient has no known allergies. Past Medical History:  Diagnosis Date  . Anemia 03/30/2012   Hx of sickle cell disease  . CAP (community acquired pneumonia)    2014  . Cholelithiasis y-1  . Cholelithiasis 03/30/2012  . Chronic wound of extremity 04/01/2012  . Elevated LFTs   . Leg ulcer ([place]) 10/27/2012   Chronic under care of wound clinic  . Multiple open wounds of lower extremity    chronic wounds B/LLE  . Sickle cell disease ([place])   . Sinus bradycardia by electrocardiogram 04/03/2012   Past Surgical History:  Procedure Laterality Date  . ALLOGRAFT APPLICATION Right 05/25/2692   Procedure: SURGICAL PREP FOR GRAFTING RIGHT LOWER EXTREMITY AND APPLICATION OF [name];  Surgeon: [name] Limbo, MD;  Location: [facility];  Service: Plastics;  Laterality: Right;  . APPLICATION OF  A-CELL OF EXTREMITY Right 10/09/2015   Procedure: APPLICATION OF [name];  Surgeon: [name] Limbo, MD;  Location: [facility];  Service: Plastics;  Laterality: Right;  . BREAST SURGERY     left breast cyst aspiration  . CESAREAN SECTION    . CESAREAN SECTION N/A 01/06/2014   Procedure: CESAREAN SECTION;  Surgeon: [name] Filbert, MD;  Location: [facility];  Service: Obstetrics;  Laterality: N/A;  . I&D EXTREMITY Right 10/09/2015   Procedure: SURGICAL PREPARATION FOR GRAFTING RIGHT ANKLE AND APPLICATION THERASKIN;  Surgeon: [name] Limbo, MD;  Location: [facility];  Service: Plastics;  Laterality: Right;  . SKIN FULL THICKNESS GRAFT Bilateral 06/17/2016   Procedure: SURGICAL PREP FOR GRAFTING, BILATERAL LOWER EXTREMITIES AND APPLICATION OF [name];  Surgeon: [name] Limbo, MD;  Location: [facility];  Service: Plastics;  Laterality: Bilateral;  . TONSILLECTOMY     Family History  Problem Relation Age of Onset  . Sickle cell anemia Sister   . Diabetes Mother   . Asthma Mother   . Sickle cell trait Mother   . Sickle cell trait Father   . Hypertension Father    Social History   Social History  . Marital status: Married    Spouse name: [name]  . Number of children: 1  . Years of education: N/A   Occupational History  . Employed in home care.    Social History Main Topics  . Smoking status: Never Smoker  . Smokeless tobacco: Never  Used  . Alcohol use No  . Drug use: No  . Sexual activity: Yes    Birth control/ protection: None   Other Topics Concern  . Not on file   Social History Narrative   Lives with husband.   Review of Systems: Pertinent items noted in HPI and remainder of comprehensive ROS otherwise negative. Physical Exam: No intake or output data in the 24 hours ending 03/02/17 1512 General: Alert, awake, oriented x3, in severe distress. Writhing in pain.   HEENT: [place]/AT PEERL, EOMI, anicteric Neck: Trachea midline,  no  masses, no thyromegal,y no JVD, no carotid bruit OROPHARYNX:  Moist, No exudate/ erythema/lesions.  Heart: Regular rate and rhythm, without murmurs, rubs, gallops, PMI non-displaced, no heaves or thrills on palpation.  Lungs: Clear to auscultation, no wheezing or rhonchi noted. No increased vocal fremitus resonant to percussion  Abdomen: Gravid Neuro: No focal neurological deficits noted cranial nerves II through XII grossly intact. Strength at baseline in bilateral upper and lower extremities. Musculoskeletal: No warmth swelling or erythema around joints, no spinal tenderness noted. Psychiatric: Patient alert and oriented x3, good insight and cognition, good recent to remote recall.   Lab results:  Recent Labs  03/02/17 0520  NA 137  K 3.5  CL 108  CO2 22  GLUCOSE 101*  BUN <5*  CREATININE <0.30*  CALCIUM 9.0    Recent Labs  03/02/17 0520  AST 56*  ALT 22  ALKPHOS 74  BILITOT 3.3*  PROT 7.2  ALBUMIN 3.3*   No results for input(s): LIPASE, AMYLASE in the last 72 hours.  Recent Labs  03/02/17 0520  WBC 19.3*  NEUTROABS 13.5*  HGB 7.6*  HCT 20.4*  MCV 87.2  PLT 440*   No results for input(s): CKTOTAL, CKMB, CKMBINDEX, TROPONINI in the last 72 hours. Invalid input(s): POCBNP No results for input(s): DDIMER in the last 72 hours. No results for input(s): HGBA1C in the last 72 hours. No results for input(s): CHOL, HDL, LDLCALC, TRIG, CHOLHDL, LDLDIRECT in the last 72 hours. No results for input(s): TSH, T4TOTAL, T3FREE, THYROIDAB in the last 72 hours.  Invalid input(s): FREET3  Recent Labs  03/02/17 0520  RETICCTPCT >23.0*   Imaging results:  [place] Mfm Ob Transvaginal  Result Date: 02/20/2017 ----------------------------------------------------------------------  OBSTETRICS REPORT                      (Signed Final 02/20/2017 07:36 pm) ---------------------------------------------------------------------- Patient Info  ID #:       [id number]                           D.O.B.:  08/02/2070 (46 yrs)  Name:       [name]                  Visit Date: 02/20/2017 10:40 am ---------------------------------------------------------------------- Performed By  Performed By:     Elisabeth [name]        Secondary Phy.:   [facility]                                                             [facility]  Attending:        Renella Cunas MD       Address:          [facility]                                                             [place], [place]  Referred By:      Sallyanne Havers               Location:         [facility] MD  Ref. Address:     [place], [place] ---------------------------------------------------------------------- Orders   #  Description  Code   1  [place] MFM OB DETAIL +14 WK                     [id number]   2  [place] MFM OB TRANSVAGINAL                      63785.8  ----------------------------------------------------------------------   #  Ordered By               Order #        Accession #    Episode #   1  [name]           [id number]      [id number]     [id number]   2  [name]           [id number]      [id number]     [id number]  ---------------------------------------------------------------------- Indications   [redacted] weeks gestation of pregnancy                Z3A.19   Encounter for antenatal screening for          Z36.3   malformations   Advanced maternal age multigravida 50+          O74.522   (32), second trimester; quad screen + for T21   Poor obstetric history: Previous IUFD          O33.299   (stillbirth) @ 28wks   Poor obstetric history: Previous preterm       O09.219   delivery, antepartum   Poor obstetric history: Previous               O09.299   preeclampsia / eclampsia/gestational HTN   Abnormal biochemical screen (quad);            O28.9   elevated MSAFP (7.9 MoM) and inhibin (4.6   MoM)   Previous cesarean delivery, antepartum (X 2)   O34.219   Maternal sickle cell anemia, second trimester  O99.012, D57.1   Vaginal bleeding in pregnancy, second          O46.92   trimester  ---------------------------------------------------------------------- OB History  Gravidity:    8         Term:   1        Prem:   3        SAB:   3  Living:       2 ---------------------------------------------------------------------- Fetal Evaluation  Num Of Fetuses:     1  Fetal Heart         157  Rate(bpm):  Cardiac Activity:   Observed  Presentation:       Variable  P. Cord Insertion:  Visualized, central  Amniotic Fluid  AFI FV:      Subjectively within normal limits                              Largest Pocket(cm)                              5.35 ---------------------------------------------------------------------- Biometry  BPD:      46.1  mm     G. Age:  20w 0d         86  %    CI:        71.22   %    70 - 86  FL/HC:      17.9   %    16.1 - 18.3  HC:       174   mm     G. Age:  19w 6d         83  %    HC/AC:      1.12        1.09 - 1.39  AC:      154.7  mm     G. Age:  20w 5d         91  %    FL/BPD:     67.5   %  FL:       31.1  mm     G. Age:  19w 4d         66  %    FL/AC:      20.1   %    20 - 24  HUM:      30.1  mm     G. Age:  20w 0d         74  %  CER:      19.1  mm     G. Age:  18w 4d         37  %  NFT:         4  mm  CM:          5  mm  Est. FW:     336  gm    0 lb 12 oz      62  %  ---------------------------------------------------------------------- Gestational Age  LMP:           19w 0d        Date:  10/10/16                 EDD:   07/17/17  U/S Today:     20w 0d                                        EDD:   07/10/17  Best:          19w 0d     Det. By:  LMP  (10/10/16)          EDD:   07/17/17 ---------------------------------------------------------------------- Anatomy  Cranium:               Appears normal         Aortic Arch:            Appears normal  Cavum:                 Appears normal         Ductal Arch:            Appears normal  Ventricles:            Appears normal         Diaphragm:              Appears normal  Choroid Plexus:        Appears normal         Stomach:                Appears normal, left  sided  Cerebellum:            Appears normal         Abdomen:                Appears normal  Posterior Fossa:       Appears normal         Abdominal Wall:         Appears nml (cord                                                                        insert, abd wall)  Nuchal Fold:           Appears normal         Cord Vessels:           Appears normal (3                                                                        vessel cord)  Face:                  Appears normal         Kidneys:                Appear normal                         (orbits and profile)  Lips:                  Appears normal         Bladder:                Appears normal  Thoracic:              Appears normal         Spine:                  Appears normal  Heart:                 Appears normal         Upper Extremities:      Appears normal                         (4CH, axis, and                         situs)  RVOT:                  Appears normal         Lower Extremities:      Appears normal  LVOT:                  Appears normal  Other:  Fetus appears to be a female. Heels and 5th digit visualized.          Technically difficult  due  to fetal position. ---------------------------------------------------------------------- Cervix Uterus Adnexa  Cervix  Length:           3.44  cm.  Normal appearance by transabdominal scan.  Uterus  No abnormality visualized.  Left Ovary  Within normal limits.  Right Ovary  Not visualized.  Adnexa:       No abnormality visualized. No adnexal mass                visualized. ---------------------------------------------------------------------- Impression  SIUP at 19+0 weeks  Normal detailed fetal anatomy; no structural fetal  abnormalities were identified to explain the elevated MSAFP  (specifically, the spine, posterior fossa and abdominal wall  were seen and appeared normal)  Markers of aneuploidy: none  Normal amniotic fluid volume  Measurements consistent with LMP dating  [facility] again visualized along the right lateral uterine wall  EV views of cervix: inferior edge of placenta covers the  internal os along with some small areas of hemorrhage  Placenta: fundal, left lateral; previa  Previa precautions given. After meeting with Santiago Glad, Ms.  Art decided to have cell free DNA screening. ---------------------------------------------------------------------- Recommendations  Follow-up ultrasound for growth and to assess placental  location in 4 weeks ----------------------------------------------------------------------                 Renella Cunas, MD Electronically Signed Final Report   02/20/2017 07:36 pm ----------------------------------------------------------------------  [place] Mfm Ob Detail +14 Wk  Result Date: 02/20/2017 ----------------------------------------------------------------------  OBSTETRICS REPORT                      (Signed Final 02/20/2017 07:36 pm) ---------------------------------------------------------------------- Patient Info  ID #:       [id number]                          D.O.B.:  [birth year] (46 yrs)  Name:       [name]                  Visit Date: 02/20/2017 10:40 am  ---------------------------------------------------------------------- Performed By  Performed By:     Elisabeth [name]        Secondary Phy.:   [facility]                                                             [place]  Attending:        Renella Cunas MD  Address:          [facility]                                                             [address]                                                             [place], [place]  Referred By:      Sallyanne Havers               Location:         [facility] MD  Ref. Address:     [place], [place] ---------------------------------------------------------------------- Orders   #  Description                                 Code   1  [place] MFM OB DETAIL +14 WK                     62952.84   2  [place] MFM OB TRANSVAGINAL                      13244.0  ----------------------------------------------------------------------   #  Ordered By               Order #        Accession #    Episode #   1  [name]  [id number]      [id number]     [id number]   2  [name]           [id number]      [id number]     [id number]  ---------------------------------------------------------------------- Indications   [redacted] weeks gestation of pregnancy                Z3A.19   Encounter for antenatal screening for          Z36.3   malformations   Advanced maternal age multigravida 50+         O09.522   (53), second trimester; quad screen + for T21   Poor obstetric history: Previous IUFD           O8.299   (stillbirth) @ 28wks   Poor obstetric history: Previous preterm       O09.219   delivery, antepartum   Poor obstetric history: Previous               O09.299   preeclampsia / eclampsia/gestational HTN   Abnormal biochemical screen (quad);            O28.9   elevated MSAFP (7.9 MoM) and inhibin (4.6   MoM)   Previous cesarean delivery, antepartum (X 2)   O34.219   Maternal sickle cell anemia, second trimester  O99.012, D57.1   Vaginal bleeding in pregnancy, second          O46.92   trimester  ---------------------------------------------------------------------- OB History  Gravidity:    8         Term:   1        Prem:   3        SAB:   3  Living:       2 ---------------------------------------------------------------------- Fetal Evaluation  Num Of Fetuses:     1  Fetal Heart         157  Rate(bpm):  Cardiac Activity:   Observed  Presentation:       Variable  P. Cord Insertion:  Visualized, central  Amniotic Fluid  AFI FV:      Subjectively within normal limits                              Largest Pocket(cm)                              5.35 ---------------------------------------------------------------------- Biometry  BPD:      46.1  mm     G. Age:  20w 0d         86  %    CI:        71.22   %    70 - 86                                                          FL/HC:      17.9   %    16.1 - 18.3  HC:       174   mm     G. Age:  19w 6d         83  %    HC/AC:      1.12  1.09 - 1.39  AC:      154.7  mm     G. Age:  20w 5d         91  %    FL/BPD:     67.5   %  FL:       31.1  mm     G. Age:  19w 4d         66  %    FL/AC:      20.1   %    20 - 24  HUM:      30.1  mm     G. Age:  20w 0d         74  %  CER:      19.1  mm     G. Age:  18w 4d         37  %  NFT:         4  mm  CM:          5  mm  Est. FW:     336  gm    0 lb 12 oz      62  % ---------------------------------------------------------------------- Gestational Age  LMP:           19w 0d        Date:  10/10/16                 EDD:    07/17/17  U/S Today:     20w 0d                                        EDD:   07/10/17  Best:          19w 0d     Det. By:  LMP  (10/10/16)          EDD:   07/17/17 ---------------------------------------------------------------------- Anatomy  Cranium:               Appears normal         Aortic Arch:            Appears normal  Cavum:                 Appears normal         Ductal Arch:            Appears normal  Ventricles:            Appears normal         Diaphragm:              Appears normal  Choroid Plexus:        Appears normal         Stomach:                Appears normal, left                                                                        sided  Cerebellum:            Appears normal  Abdomen:                Appears normal  Posterior Fossa:       Appears normal         Abdominal Wall:         Appears nml (cord                                                                        insert, abd wall)  Nuchal Fold:           Appears normal         Cord Vessels:           Appears normal (3                                                                        vessel cord)  Face:                  Appears normal         Kidneys:                Appear normal                         (orbits and profile)  Lips:                  Appears normal         Bladder:                Appears normal  Thoracic:              Appears normal         Spine:                  Appears normal  Heart:                 Appears normal         Upper Extremities:      Appears normal                         (4CH, axis, and                         situs)  RVOT:                  Appears normal         Lower Extremities:      Appears normal  LVOT:                  Appears normal  Other:  Fetus appears to be a female. Heels and 5th digit visualized.          Technically difficult due to fetal position. ---------------------------------------------------------------------- Cervix Uterus Adnexa  Cervix  Length:           3.44  cm.  Normal appearance by transabdominal scan.  Uterus  No abnormality visualized.  Left Ovary  Within normal limits.  Right Ovary  Not visualized.  Adnexa:       No abnormality visualized. No adnexal mass                visualized. ---------------------------------------------------------------------- Impression  SIUP at 19+0 weeks  Normal detailed fetal anatomy; no structural fetal  abnormalities were identified to explain the elevated MSAFP  (specifically, the spine, posterior fossa and abdominal wall  were seen and appeared normal)  Markers of aneuploidy: none  Normal amniotic fluid volume  Measurements consistent with LMP dating  [facility] again visualized along the right lateral uterine wall  EV views of cervix: inferior edge of placenta covers the  internal os along with some small areas of hemorrhage  Placenta: fundal, left lateral; previa  Previa precautions given. After meeting with Santiago Glad, Ms.  [place] decided to have cell free DNA screening. ---------------------------------------------------------------------- Recommendations  Follow-up ultrasound for growth and to assess placental  location in 4 weeks ----------------------------------------------------------------------                 Renella Cunas, MD Electronically Signed Final Report   02/20/2017 07:36 pm ----------------------------------------------------------------------  Other results: Duplex U/S RLE - No evidence of deep vein thrombosis involving the visualized veins of the right lower extremity. - No evidence of Baker&'s cyst on the right.   Assessment and Plan: 1. Hb SS with crisis: Will start on a PCA settings of 0.4 mg . 10 minute lockout 2. Leukocytosis: No evidence of infection. Suspect related to crisis.  3. Leg Ulcer RLE: Healing well. Continue Resta or Santyl as formulary substitute daily.  4. Anemia of Chronic Disease: Currently Hb stable. However suspect hemo-concentrated state. 5. Pregnancy [redacted] weeks GA: Obstetrics consulted.  To follow up tomorrow.  6. Thrombocytosis: reactive. Continue ASA 7. DVT Prolhylaxis: Lovenox.   In excess of 60 minutes spent during this encounter, Greater than 50 % of time spent in face to face contact, counseling and coordination of care.   Carrick Rijos A. 03/02/2017, 3:12 PM    Jovon Winterhalter A.  Pager [phone]. If 7PM-7AM, please contact night-coverage.

## 2017-03-02 NOTE — Progress Notes (Signed)
*  PRELIMINARY RESULTS* Vascular Ultrasound Right lower extremity venous duplex has been completed.  Preliminary findings: No evidence of deep vein thrombosis or baker's cyst in the right lower extremity.  Preliminary results given to patients nurse, Harrell Gave, @ 10:50   [place] 03/02/2017, 11:14 AM

## 2017-03-02 NOTE — ED Provider Notes (Signed)
[facility] DEPT Provider Note   CSN: [id number] Arrival date & time: 03/02/17  0441     History   Chief Complaint Chief Complaint  Patient presents with  . Sickle Cell Pain Crisis    HPI [name] is a 46 y.o. female.  HPI  Patient is a 79 -year-old female, [redacted] weeks pregnant, presenting for acute right leg pain from the thigh down starting at noon on 03-01-2017. Patient reports that this is typical of her sickle cell pain. Patient reports pain is 10 out of 10 in severity and constant. Patient reports she is unable to walk on the right leg due to pain. Patient attempted 2, 10 mg doses oxycodone at home with no relief. Patient reports pain palpation of superior leg, calf, and ankle. Patient denies any fever, chills, shortness of breath, chest pain, headaches, visual changes, swollen or erythematous joints, dysuria, abdominal pain, nausea, vomiting.   Patient notes she has had brown spotting in x2-3 months in this pregnancy. No vaginal bleeding today. Patient is not experiencing any contractions yesterday or today. Per chart review this was a similar occurrence on her past visit on 02-16-2017. Patient diagnosed with subchorionic hemorrhage. ED provider spoke with CNM Fatima Blank, who recommended f/u outpatient and no further testing be performed. Current episode today is unchanged from prior.  Patient part she is followed by maternal fetal medicine at [facility]. Additionally, patient is followed by sickle cell clinic. She has had recent hospitalization for sickle cell pain crisis and acute chest syndrome. Patient received 1 mg of Dilaudid for typical pain at [redacted] weeks pregnant per chart review.  Past Medical History:  Diagnosis Date  . Anemia 03/30/2012   Hx of sickle cell disease  . CAP (community acquired pneumonia)    2014  . Cholelithiasis y-1  . Cholelithiasis 03/30/2012  . Chronic wound of extremity 04/01/2012  . Elevated LFTs   . Leg ulcer ([place])  10/27/2012   Chronic under care of wound clinic  . Multiple open wounds of lower extremity    chronic wounds B/LLE  . Sickle cell disease ([place])   . Sinus bradycardia by electrocardiogram 04/03/2012    Patient Active Problem List   Diagnosis Date Noted  . Partial placenta previa - left lateral previa 02/21/2017  . Abnormal MSAFP (maternal serum alpha-fetoprotein), elevated 02/18/2017  . Subchorionic hematoma in first trimester 12/23/2016  . Supervision of high risk pregnancy, antepartum 12/16/2016  . History of pre-eclampsia 12/16/2016  . Sickle cell anemia of mother during pregnancy ([place]) 11/09/2016  . Hb-SS disease with crisis ([place]) 08/16/2016  . Advanced maternal age in multigravida 07/28/2013  . Previous cesarean delivery x 2 07/22/2013  . Hemochromatosis 11/10/2012  . Chronic pain syndrome 10/13/2012  . Leukocytosis 03/30/2012    Past Surgical History:  Procedure Laterality Date  . ALLOGRAFT APPLICATION Right 10/24/3014   Procedure: SURGICAL PREP FOR GRAFTING RIGHT LOWER EXTREMITY AND APPLICATION OF Jannifer Hick;  Surgeon: Irene Limbo, MD;  Location: [place] [facility];  Service: Plastics;  Laterality: Right;  . APPLICATION OF A-CELL OF EXTREMITY Right 10/09/2015   Procedure: APPLICATION OF Jannifer Hick;  Surgeon: Irene Limbo, MD;  Location: [facility];  Service: Plastics;  Laterality: Right;  . BREAST SURGERY     left breast cyst aspiration  . CESAREAN SECTION    . CESAREAN SECTION N/A 01/06/2014   Procedure: CESAREAN SECTION;  Surgeon: Emily Filbert, MD;  Location: [facility];  Service: Obstetrics;  Laterality: N/A;  . I&D EXTREMITY  Right 10/09/2015   Procedure: SURGICAL PREPARATION FOR GRAFTING RIGHT ANKLE AND APPLICATION THERASKIN;  Surgeon: Irene Limbo, MD;  Location: [facility];  Service: Plastics;  Laterality: Right;  . SKIN FULL THICKNESS GRAFT Bilateral 06/17/2016   Procedure: SURGICAL PREP FOR GRAFTING, BILATERAL LOWER EXTREMITIES  AND APPLICATION OF Jannifer Hick;  Surgeon: Irene Limbo, MD;  Location: [facility];  Service: Plastics;  Laterality: Bilateral;  . TONSILLECTOMY      OB History    Gravida Para Term Preterm AB Living   8 4 1 3 3 2    SAB TAB Ectopic Multiple Live Births   3       3       Home Medications    Prior to Admission medications   Medication Sig Start Date End Date Taking? Authorizing Provider  aspirin EC 81 MG tablet Take 1 tablet (81 mg total) by mouth daily. Take after 12 weeks for prevention of preeclampsia later in pregnancy 01/15/17  Yes Anyanwu, Sallyanne Havers, MD  Cholecalciferol (VITAMIN D) 2000 units CAPS Take 2 capsules (4,000 Units total) by mouth daily. 06/03/16  Yes [name], FNP  Clobetasol Prop Emollient Base (CLOBETASOL PROPIONATE E) 0.05 % emollient cream Apply 1 application topically 2 (two) times daily. 12/20/16  Yes [name], FNP  folic acid (FOLVITE) 1 MG tablet Take 5 tablets (5 mg total) by mouth daily. 01/15/17  Yes Anyanwu, Sallyanne Havers, MD  Oxycodone HCl 10 MG TABS Take 1 tablet (10 mg total) by mouth every 4 (four) hours as needed (pain). 02/21/17  Yes [name], FNP  Prenatal Multivit-Min-Fe-FA (PRENATAL VITAMINS) 0.8 MG tablet Take 1 tablet by mouth daily. 11/14/16  Yes Leana Gamer, MD  RESTA SILVER GEL Apply 1 application topically daily. Applies to leg   Yes [provider]    Family History Family History  Problem Relation Age of Onset  . Sickle cell anemia Sister   . Diabetes Mother   . Asthma Mother   . Sickle cell trait Mother   . Sickle cell trait Father   . Hypertension Father     Social History Social History  Substance Use Topics  . Smoking status: Never Smoker  . Smokeless tobacco: Never Used  . Alcohol use No     Allergies   Patient has no known allergies.   Review of Systems Review of Systems  Constitutional: Negative for chills and fever.  HENT: Negative for congestion, rhinorrhea, sinus pain and sore  throat.   Eyes: Negative for visual disturbance.  Respiratory: Negative for cough, chest tightness and shortness of breath.   Cardiovascular: Negative for chest pain, palpitations and leg swelling.  Gastrointestinal: Negative for abdominal pain, constipation, diarrhea, nausea and vomiting.  Genitourinary: Negative for dysuria and flank pain.  Musculoskeletal: Positive for arthralgias, joint swelling and myalgias. Negative for back pain.  Skin: Positive for wound. Negative for rash.  Neurological: Negative for dizziness, syncope, light-headedness and headaches.     Physical Exam Updated Vital Signs BP 137/86   Pulse 83   Temp 98 F (36.7 C) (Oral)   Resp 15   LMP 10/10/2016 Comment: patient double shielded  SpO2 92%   Physical Exam  Constitutional:  Well-developed, well-nourished gravid female.  HENT:  Head: Normocephalic and atraumatic.  Mouth/Throat: Oropharynx is clear and moist.  Eyes: Pupils are equal, round, and reactive to light. Conjunctivae and EOM are normal.  Neck: Normal range of motion. Neck supple.  Cardiovascular: Normal rate, regular rhythm, S1  normal, S2 normal, normal heart sounds and intact distal pulses.   No murmur heard. Pulmonary/Chest: Effort normal and breath sounds normal. She has no wheezes. She has no rales.  Abdominal: Soft. There is no tenderness.  Gravid abdomen. Fetal heart tones 142.  Musculoskeletal: Normal range of motion. She exhibits no edema or deformity.  No erythema or edema of right thigh, calf, or knee joint. Pain to palpation of right calf and right upper thigh. Increased edema around medial and lateral malleolus of right leg. 2+ DP pulses of b/l lower extremities.  Lymphadenopathy:    She has no cervical adenopathy.  Neurological: She is alert.  Cranial nerves grossly intact. Patient moves extremities with good coordination.  Gait normal and symmetric.  Skin: Skin is warm and dry. No erythema.  Multiple ulcers scattered around  bilateral ankles up to tibial tuberosity.  Psychiatric: She has a normal mood and affect. Her behavior is normal. Judgment and thought content normal.  Nursing note and vitals reviewed.    ED Treatments / Results  Labs (all labs ordered are listed, but only abnormal results are displayed) Labs Reviewed  CBC WITH DIFFERENTIAL/PLATELET - Abnormal; Notable for the following:       Result Value   WBC 19.3 (*)    RBC 2.34 (*)    Hemoglobin 7.6 (*)    HCT 20.4 (*)    MCHC 37.3 (*)    RDW 21.1 (*)    Platelets 440 (*)    nRBC 17 (*)    Neutro Abs 13.5 (*)    Monocytes Absolute 2.3 (*)    All other components within normal limits  COMPREHENSIVE METABOLIC PANEL - Abnormal; Notable for the following:    Glucose, Bld 101 (*)    BUN <5 (*)    Creatinine, Ser <0.30 (*)    Albumin 3.3 (*)    AST 56 (*)    Total Bilirubin 3.3 (*)    All other components within normal limits  RETICULOCYTES - Abnormal; Notable for the following:    Retic Ct Pct >23.0 (*)    RBC. 2.34 (*)    All other components within normal limits    EKG  EKG Interpretation None       Radiology No results found.  Procedures Procedures (including critical care time)  Medications Ordered in ED Medications  0.45 % sodium chloride infusion (not administered)     Initial Impression / Assessment and Plan / ED Course  I have reviewed the triage vital signs and the nursing notes.  Pertinent labs & imaging results that were available during my care of the patient were reviewed by me and considered in my medical decision making (see chart for details).    Final Clinical Impressions(s) / ED Diagnoses   Final diagnoses:  None   Differential diagnosis includes sickle cell pain crisis versus DVT, osteomyelitis, septic joint. Joints of the right lower extremity no erythema or edema suggestive of osteomyelitis or septic joint.. Right ankle is notably more edematous than left ankle, therefore we will proceed with  venous ultrasound of the right lower cavity. Patient has chronic stasis ulcers of bilateral lower extremities. Patient reports that these wounds are at baseline at present. Patient is followed at [facility] wound care for these wounds.  Hemoglobin at baseline at 7.6. Reticulocyte count at baseline at greater than 23. Potassium 3.5 today. Lower extremity ultrasound demonstrates no evidence of DVT or ruptured Baker cyst.  Patient reports she has had brown vaginal spotting 2-3  months. She noted this yesterday but has none today. She is diagnosed with subchorionic hemorrhage. On her last emergency department visit on 02-16-2017, this was also noted and a consult was placed to [facility]. As this is a chronic issue, it was not recommended to do further workup at this time in the absence of contractions. Patient is not having contractions today.  Fetal heart tones noted to be 142 with Doppler emergency department today on initial evaluation. On subsequent reevaluation after doses 2 and 3 of dilaudid, fetal heart tones were 133 and 140 respectively. Pain not adequately controlled after 3 doses of dilaudid. Patient to be admitted for nonresolving sickle cell pain crisis. OB on call recommends medical admission due to nonviable fetus.  This is a shared visit with Dr. Orlie Dakin. Patient was independently evaluated by this attending physician. Attending physician consulted in evaluation and admission management.  New Prescriptions New Prescriptions   No medications on file       Tamala Julian 03/02/17 1639    Orlie Dakin, MD 03/02/17 [phone]

## 2017-03-03 DIAGNOSIS — L97919 Non-pressure chronic ulcer of unspecified part of right lower leg with unspecified severity: Secondary | ICD-10-CM

## 2017-03-03 DIAGNOSIS — Z3492 Encounter for supervision of normal pregnancy, unspecified, second trimester: Secondary | ICD-10-CM

## 2017-03-03 LAB — CBC WITH DIFFERENTIAL/PLATELET
BASOS PCT: 0 %
Band Neutrophils: 8 %
Basophils Absolute: 0 10*3/uL (ref 0.0–0.1)
Blasts: 0 %
EOS PCT: 1 %
Eosinophils Absolute: 0.2 10*3/uL (ref 0.0–0.7)
HCT: 18.6 % — ABNORMAL LOW (ref 36.0–46.0)
Hemoglobin: 6.8 g/dL — CL (ref 12.0–15.0)
LYMPHS ABS: 2.2 10*3/uL (ref 0.7–4.0)
LYMPHS PCT: 13 %
MCH: 32.4 pg (ref 26.0–34.0)
MCHC: 36.6 g/dL — AB (ref 30.0–36.0)
MCV: 88.6 fL (ref 78.0–100.0)
MONO ABS: 1.8 10*3/uL — AB (ref 0.1–1.0)
MONOS PCT: 11 %
MYELOCYTES: 0 %
Metamyelocytes Relative: 0 %
NEUTROS PCT: 67 %
NRBC: 0 /100{WBCs}
Neutro Abs: 12.6 10*3/uL — ABNORMAL HIGH (ref 1.7–7.7)
OTHER: 0 %
PLATELETS: 377 10*3/uL (ref 150–400)
Promyelocytes Absolute: 0 %
RBC: 2.1 MIL/uL — AB (ref 3.87–5.11)
RDW: 21.7 % — ABNORMAL HIGH (ref 11.5–15.5)
WBC: 16.8 10*3/uL — AB (ref 4.0–10.5)

## 2017-03-03 LAB — RETICULOCYTES: RBC.: 2.1 MIL/uL — ABNORMAL LOW (ref 3.87–5.11)

## 2017-03-03 LAB — LACTATE DEHYDROGENASE: LDH: 344 U/L — AB (ref 98–192)

## 2017-03-03 MED ORDER — OXYCODONE HCL 5 MG PO TABS
10.0000 mg | ORAL_TABLET | Freq: Four times a day (QID) | ORAL | Status: DC
  Administered 2017-03-03 – 2017-03-06 (×12): 10 mg via ORAL
  Filled 2017-03-03 (×12): qty 2

## 2017-03-03 MED ORDER — HYDROMORPHONE HCL 1 MG/ML IJ SOLN
1.0000 mg | Freq: Once | INTRAMUSCULAR | Status: AC
  Administered 2017-03-03: 1 mg via INTRAVENOUS
  Filled 2017-03-03: qty 1

## 2017-03-03 MED ORDER — SODIUM CHLORIDE 0.9 % IV SOLN: Freq: Once | INTRAVENOUS | Status: DC

## 2017-03-03 MED ORDER — HYDROMORPHONE HCL 1 MG/ML IJ SOLN
0.5000 mg | Freq: Once | INTRAMUSCULAR | Status: AC
  Administered 2017-03-03: 0.5 mg via INTRAVENOUS
  Filled 2017-03-03: qty 1

## 2017-03-03 MED ORDER — HYDROMORPHONE 1 MG/ML IV SOLN
INTRAVENOUS | Status: DC
  Administered 2017-03-03: 11.3 mg via INTRAVENOUS
  Administered 2017-03-03: 5 mg via INTRAVENOUS
  Administered 2017-03-04: 25 mg via INTRAVENOUS
  Administered 2017-03-04: 8.5 mg via INTRAVENOUS
  Administered 2017-03-04: 7 mg via INTRAVENOUS
  Administered 2017-03-04: 25 mg via INTRAVENOUS
  Administered 2017-03-04: 3 mg via INTRAVENOUS
  Administered 2017-03-04: 7 mg via INTRAVENOUS
  Administered 2017-03-04: 4.5 mg via INTRAVENOUS
  Administered 2017-03-05: 5 mg via INTRAVENOUS
  Administered 2017-03-05: 6.5 mg via INTRAVENOUS
  Administered 2017-03-05: 25 mg via INTRAVENOUS
  Administered 2017-03-05: 6 mg via INTRAVENOUS
  Administered 2017-03-05: 8.5 mg via INTRAVENOUS
  Administered 2017-03-05: 3 mg via INTRAVENOUS
  Administered 2017-03-06: 3.46 mg via INTRAVENOUS
  Administered 2017-03-06: 25 mg via INTRAVENOUS
  Administered 2017-03-06: 5.5 mg via INTRAVENOUS
  Administered 2017-03-06: 4 mg via INTRAVENOUS
  Administered 2017-03-06: 1.5 mg via INTRAVENOUS
  Administered 2017-03-06 – 2017-03-07 (×3): 4.5 mg via INTRAVENOUS
  Administered 2017-03-07: 2.5 mg via INTRAVENOUS
  Administered 2017-03-07: 4 mg via INTRAVENOUS
  Administered 2017-03-07: 25 mg via INTRAVENOUS
  Administered 2017-03-07: 3.5 mg via INTRAVENOUS
  Administered 2017-03-07: 4 mg via INTRAVENOUS
  Administered 2017-03-07: 2.5 mg via INTRAVENOUS
  Administered 2017-03-07: 3 mg via INTRAVENOUS
  Administered 2017-03-08: 4.09 mg via INTRAVENOUS
  Administered 2017-03-08: 1.5 mg via INTRAVENOUS
  Administered 2017-03-08: 2.2 mg via INTRAVENOUS
  Administered 2017-03-08: 4.5 mg via INTRAVENOUS
  Administered 2017-03-08: 6 mg via INTRAVENOUS
  Administered 2017-03-09: 5 mg via INTRAVENOUS
  Administered 2017-03-09: 4 mg via INTRAVENOUS
  Administered 2017-03-09: 3 mg via INTRAVENOUS
  Administered 2017-03-09: 1.5 mg via INTRAVENOUS
  Administered 2017-03-09: 2.6 mg via INTRAVENOUS
  Administered 2017-03-09: 03:00:00 via INTRAVENOUS
  Administered 2017-03-09: 4 mg via INTRAVENOUS
  Administered 2017-03-09: 3.5 mg via INTRAVENOUS
  Administered 2017-03-10: 8.5 mg via INTRAVENOUS
  Administered 2017-03-10: 3.5 mg via INTRAVENOUS
  Administered 2017-03-10: 4 mg via INTRAVENOUS
  Administered 2017-03-10: 2.5 mg via INTRAVENOUS
  Administered 2017-03-10: 2 mg via INTRAVENOUS
  Administered 2017-03-10: 2.5 mg via INTRAVENOUS
  Administered 2017-03-10: 5 mg via INTRAVENOUS
  Administered 2017-03-11: 4.09 mg via INTRAVENOUS
  Administered 2017-03-11: 4.5 mg via INTRAVENOUS
  Administered 2017-03-11: 1.5 mg via INTRAVENOUS
  Administered 2017-03-11: 4.5 mg via INTRAVENOUS
  Administered 2017-03-11: 4.7 mg via INTRAVENOUS
  Administered 2017-03-11: 3 mg via INTRAVENOUS
  Filled 2017-03-03 (×9): qty 25

## 2017-03-03 MED ORDER — ACETAMINOPHEN 325 MG PO TABS
650.0000 mg | ORAL_TABLET | ORAL | Status: DC | PRN
Start: 2017-03-03 — End: 2017-03-11
  Administered 2017-03-03 – 2017-03-10 (×2): 650 mg via ORAL
  Filled 2017-03-03 (×2): qty 2

## 2017-03-03 NOTE — Progress Notes (Signed)
[name], a 46 year old female with a history of sickle cell anemia, [redacted] weeks pregnant, high risk multigravida was evaluated in the clinic. Hemoglobinopathy was reviewed, Hgb S 87. Patient was a patient of Dr. Heath Lark, hematology during previous pregnancy. Dr. Alvy Bimler will not be able to see patient due to schedule constraints. Patient will warrant close monitoring by hematology due to sickle cell anemia and geriatric pregnancy. Appointment is scheduled with Dr. Louretta Shorten at [facility] on 04/07/2017.  Will also review case with Dr. Doreene Burke, patient may warrant exchange transfusion prior to appointment.    [name] Pounds  MSN, FNP-C [facility] [address] [place], [place] [address] [phone]

## 2017-03-03 NOTE — Progress Notes (Signed)
CRITICAL VALUE ALERT  Critical Value:  hgb 6.8  Date & Time Notied:  03/04/15  Provider Notified: Dr Zigmund Daniel  Orders Received/Actions taken: yes

## 2017-03-03 NOTE — Progress Notes (Signed)
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 Hb SS with Crisis: Increase PCA to dose of 0.5 mg, lockout of 10 minutes, Decrease IVF.  2. Anemia of Chronic Disease: So far no hypoxia or signs of acute anemia. Will continue to monitor and transfuse as needed. 3. 2nd Trimester Pregnancy: No heart tones checked today. Will consult MFM. 4. Chronic Leg Ulcer: Continue Santyl.  Code Status: Full Code Family Communication: N/A Disposition Plan: Not yet ready for discharge  [place].  Pager [phone]. If 7PM-7AM, please contact night-coverage.  03/03/2017, 5:17 PM  LOS: 1 day   Interim History: Pt reports pain 10/10 and localized to RLE. She has used 28.38 mg of Dilaudid on the PCA with 75/71:demands/deliveries in the last 24 hours.   Consultants:  Obstetrics  Procedures:  None  Antibiotics:  None    Objective: Vitals:   03/03/17 1155 03/03/17 1207 03/03/17 1556 03/03/17 1714  BP: (!) 147/83  135/81   Pulse: 92  90   Resp: 20 20 20 15   Temp: 99 F (37.2 C)  98.8 F (37.1 C)   TempSrc: Oral  Oral   SpO2: 92% 92% 93% 90%  Weight:      Height:       Weight change:   Intake/Output Summary (Last 24 hours) at 03/03/17 1717 Last data filed at 03/03/17 0421  Gross per 24 hour  Intake              120 ml  Output                0 ml  Net              120 ml    .  Physical Exam General: Alert, awake, oriented x3, in moderate distress.  HEENT: [place]/AT PEERL, EOMI, anicteric.  Neck: Trachea midline,  no masses, no thyromegal,y no JVD, no carotid bruit OROPHARYNX:  Moist, No exudate/ erythema/lesions.  Heart: Regular rate and rhythm, II/VI non-radiating murmur at base.  Np rubs, gallops, PMI non-displaced, no heaves or thrills on palpation.  Lungs: Clear to auscultation, no wheezing or rhonchi  noted. No increased vocal fremitus resonant to percussion  Abdomen: Soft, nontender, nondistended, positive bowel sounds, no masses no hepatosplenomegaly noted..  Neuro: No focal neurological deficits noted cranial nerves II through XII grossly intact. Strength at baseline in bilateral upper and lower extremities. Musculoskeletal: No warmth swelling or erythema around joints, no spinal tenderness noted. Psychiatric: Patient alert and oriented x3, good insight and cognition, good recent to remote recall.    Data Reviewed: Basic Metabolic Panel:  Recent Labs Lab 03/02/17 0520  NA 137  K 3.5  CL 108  CO2 22  GLUCOSE 101*  BUN <5*  CREATININE <0.30*  CALCIUM 9.0   Liver Function Tests:  Recent Labs Lab 03/02/17 0520  AST 56*  ALT 22  ALKPHOS 74  BILITOT 3.3*  PROT 7.2  ALBUMIN 3.3*   No results for input(s): LIPASE, AMYLASE in the last 168 hours. No results for input(s): AMMONIA in the last 168 hours. CBC:  Recent Labs Lab 03/02/17 0520 03/03/17 1242  WBC 19.3* 16.8*  NEUTROABS 13.5* 12.6*  HGB 7.6* 6.8*  HCT 20.4* 18.6*  MCV 87.2 88.6  PLT 440* 377   Cardiac Enzymes: No results for input(s): CKTOTAL, CKMB, CKMBINDEX, TROPONINI in the last 168 hours. BNP (  last 3 results)  Recent Labs  08/15/16 2007  BNP 48.3    ProBNP (last 3 results) No results for input(s): PROBNP in the last 8760 hours.  CBG: No results for input(s): GLUCAP in the last 168 hours.  No results found for this or any previous visit (from the past 240 hour(s)).   Studies: [place] Mfm Ob Transvaginal  Result Date: 02/20/2017 ----------------------------------------------------------------------  OBSTETRICS REPORT                      (Signed Final 02/20/2017 07:36 pm) ---------------------------------------------------------------------- Patient Info  ID #:       [id number]                          D.O.B.:  2070-05-23 (46 yrs)  Name:       [name]                  Visit Date: 02/20/2017  10:40 am ---------------------------------------------------------------------- Performed By  Performed By:     Elisabeth [name]        Secondary Phy.:   [facility]                                                             [place]  Attending:        Renella Cunas MD       Address:          [facility]                                                             [address]  [place], [place]  Referred By:      Sallyanne Havers               Location:         [facility] MD  Ref. Address:     [place], [place] ---------------------------------------------------------------------- Orders   #  Description                                 Code   1  [place] MFM OB DETAIL +14 [place]                     76811.01   2  [place] MFM OB TRANSVAGINAL                      33825.0  ----------------------------------------------------------------------   #  Ordered By               Order #        Accession #    Episode #   1  [name]           [id number]      [id number]     [id number]   2  [name]           [id number]      [id number]     [id number]  ---------------------------------------------------------------------- Indications   [redacted] weeks gestation of pregnancy                Z3A.19   Encounter for antenatal screening for          Z36.3   malformations   Advanced maternal age multigravida 54+         O80.522   (37), second trimester; quad screen + for T21   Poor obstetric history: Previous IUFD           O86.299   (stillbirth) @ 28wks   Poor obstetric history: Previous preterm       O09.219   delivery, antepartum   Poor obstetric history: Previous               O09.299   preeclampsia / eclampsia/gestational HTN   Abnormal biochemical screen (quad);  O28.9   elevated MSAFP (7.9 MoM) and inhibin (4.6   MoM)   Previous cesarean delivery, antepartum (X 2)   O34.219   Maternal sickle cell anemia, second trimester  O99.012, D57.1   Vaginal bleeding in pregnancy, second          O46.92   trimester  ---------------------------------------------------------------------- OB History  Gravidity:    8         Term:   1        Prem:   3        SAB:   3  Living:       2 ---------------------------------------------------------------------- Fetal Evaluation  Num Of Fetuses:     1  Fetal Heart         157  Rate(bpm):  Cardiac Activity:   Observed  Presentation:       Variable  P. Cord Insertion:  Visualized, central  Amniotic Fluid  AFI FV:      Subjectively within normal limits                              Largest Pocket(cm)                              5.35 ---------------------------------------------------------------------- Biometry  BPD:      46.1  mm     G. Age:  20w 0d         86  %    CI:        71.22   %    70 - 86                                                          FL/HC:      17.9   %    16.1 - 18.3  HC:       174   mm     G. Age:  19w 6d         83  %    HC/AC:      1.12        1.09 - 1.39  AC:      154.7  mm     G. Age:  20w 5d         91  %    FL/BPD:     67.5   %  FL:       31.1  mm     G. Age:  19w 4d         66  %    FL/AC:      20.1   %    20 - 24  HUM:      30.1  mm     G. Age:  20w 0d         74  %  CER:      19.1  mm     G. Age:  18w 4d         37  %  NFT:         4  mm  CM:          5  mm  Est. FW:     336  gm  0 lb 12 oz      62  % ---------------------------------------------------------------------- Gestational Age  LMP:           19w 0d        Date:  10/10/16                 EDD:    07/17/17  U/S Today:     20w 0d                                        EDD:   07/10/17  Best:          19w 0d     Det. By:  LMP  (10/10/16)          EDD:   07/17/17 ---------------------------------------------------------------------- Anatomy  Cranium:               Appears normal         Aortic Arch:            Appears normal  Cavum:                 Appears normal         Ductal Arch:            Appears normal  Ventricles:            Appears normal         Diaphragm:              Appears normal  Choroid Plexus:        Appears normal         Stomach:                Appears normal, left                                                                        sided  Cerebellum:            Appears normal         Abdomen:                Appears normal  Posterior Fossa:       Appears normal         Abdominal Wall:         Appears nml (cord                                                                        insert, abd wall)  Nuchal Fold:           Appears normal         Cord Vessels:           Appears normal (3  vessel cord)  Face:                  Appears normal         Kidneys:                Appear normal                         (orbits and profile)  Lips:                  Appears normal         Bladder:                Appears normal  Thoracic:              Appears normal         Spine:                  Appears normal  Heart:                 Appears normal         Upper Extremities:      Appears normal                         (4CH, axis, and                         situs)  RVOT:                  Appears normal         Lower Extremities:      Appears normal  LVOT:                  Appears normal  Other:  Fetus appears to be a female. Heels and 5th digit visualized.          Technically difficult due to fetal position. ---------------------------------------------------------------------- Cervix Uterus Adnexa  Cervix  Length:           3.44  cm.   Normal appearance by transabdominal scan.  Uterus  No abnormality visualized.  Left Ovary  Within normal limits.  Right Ovary  Not visualized.  Adnexa:       No abnormality visualized. No adnexal mass                visualized. ---------------------------------------------------------------------- Impression  SIUP at 19+0 weeks  Normal detailed fetal anatomy; no structural fetal  abnormalities were identified to explain the elevated MSAFP  (specifically, the spine, posterior fossa and abdominal wall  were seen and appeared normal)  Markers of aneuploidy: none  Normal amniotic fluid volume  Measurements consistent with LMP dating  [facility] again visualized along the right lateral uterine wall  EV views of cervix: inferior edge of placenta covers the  internal os along with some small areas of hemorrhage  Placenta: fundal, left lateral; previa  Previa precautions given. After meeting with Santiago Glad, Ms.  [place] decided to have cell free DNA screening. ---------------------------------------------------------------------- Recommendations  Follow-up ultrasound for growth and to assess placental  location in 4 weeks ----------------------------------------------------------------------                 Renella Cunas, MD Electronically Signed Final Report   02/20/2017 07:36 pm ----------------------------------------------------------------------  [place] Mfm Ob Detail +14 Wk  Result Date: 02/20/2017 ----------------------------------------------------------------------  OBSTETRICS REPORT                      (  Signed Final 02/20/2017 07:36 pm) ---------------------------------------------------------------------- Patient Info  ID #:       [id number]                          D.O.B.:  Apr 07, 2071 (46 yrs)  Name:       [name]                  Visit Date: 02/20/2017 10:40 am ---------------------------------------------------------------------- Performed By  Performed By:     Elisabeth [name]        Secondary Phy.:   [facility]                                                             [facility]  Attending:        Renella Cunas MD       Address:          [facility]                                                             [place], [place]  [address]  Referred By:      Sallyanne Havers               Location:         [facility] MD  Ref. Address:     [place], [place] ---------------------------------------------------------------------- Orders   #  Description                                 Code   1  [place] MFM OB DETAIL +14 [place]                     76811.01   2  [place] MFM OB TRANSVAGINAL                      67341.9  ----------------------------------------------------------------------   #  Ordered By               Order #        Accession #    Episode #   1  [name]           [id number]      [id number]     [id number]   2  [name]           [id number]      [id number]     [id number]  ---------------------------------------------------------------------- Indications   [redacted] weeks gestation of pregnancy                Z3A.19   Encounter for antenatal screening for          Z36.3   malformations   Advanced maternal age multigravida 61+         O3.522   (34), second trimester; quad screen + for T21   Poor obstetric history: Previous IUFD          O56.299   (stillbirth) @ 28wks   Poor obstetric history: Previous preterm       O09.219   delivery, antepartum   Poor obstetric history: Previous                O09.299   preeclampsia / eclampsia/gestational HTN   Abnormal biochemical screen (quad);            O28.9   elevated MSAFP (7.9 MoM) and inhibin (4.6   MoM)   Previous cesarean delivery, antepartum (X 2)   O34.219   Maternal sickle cell anemia, second trimester  O99.012, D57.1   Vaginal bleeding in pregnancy, second          O46.92   trimester  ---------------------------------------------------------------------- OB History  Gravidity:    8         Term:   1        Prem:   3        SAB:   3  Living:       2 ---------------------------------------------------------------------- Fetal Evaluation  Num Of Fetuses:     1  Fetal Heart         157  Rate(bpm):  Cardiac Activity:   Observed  Presentation:       Variable  P. Cord Insertion:  Visualized, central  Amniotic Fluid  AFI FV:      Subjectively within normal limits                              Largest Pocket(cm)                              5.35 ---------------------------------------------------------------------- Biometry  BPD:      46.1  mm     G. Age:  20w 0d         86  %    CI:        71.22   %    70 - 86                                                          FL/HC:      17.9   %    16.1 - 18.3  HC:       174   mm     G. Age:  19w 6d         83  %    HC/AC:      1.12        1.09 - 1.39  AC:      154.7  mm     G. Age:  20w 5d         91  %    FL/BPD:     67.5   %  FL:       31.1  mm     G. Age:  19w 4d         66  %    FL/AC:      20.1   %    20 - 24  HUM:      30.1  mm     G. Age:  20w 0d         74  %  CER:      19.1  mm     G. Age:  18w 4d         37  %  NFT:         4  mm  CM:          5  mm  Est. FW:     336  gm    0 lb 12 oz      62  % ---------------------------------------------------------------------- Gestational Age  LMP:           19w 0d        Date:  10/10/16                 EDD:   07/17/17  U/S Today:     20w 0d                                        EDD:   07/10/17  Best:  19w 0d     Det. By:  LMP  (10/10/16)          EDD:    07/17/17 ---------------------------------------------------------------------- Anatomy  Cranium:               Appears normal         Aortic Arch:            Appears normal  Cavum:                 Appears normal         Ductal Arch:            Appears normal  Ventricles:            Appears normal         Diaphragm:              Appears normal  Choroid Plexus:        Appears normal         Stomach:                Appears normal, left                                                                        sided  Cerebellum:            Appears normal         Abdomen:                Appears normal  Posterior Fossa:       Appears normal         Abdominal Wall:         Appears nml (cord                                                                        insert, abd wall)  Nuchal Fold:           Appears normal         Cord Vessels:           Appears normal (3                                                                        vessel cord)  Face:                  Appears normal         Kidneys:                Appear normal                         (orbits and profile)  Lips:  Appears normal         Bladder:                Appears normal  Thoracic:              Appears normal         Spine:                  Appears normal  Heart:                 Appears normal         Upper Extremities:      Appears normal                         (4CH, axis, and                         situs)  RVOT:                  Appears normal         Lower Extremities:      Appears normal  LVOT:                  Appears normal  Other:  Fetus appears to be a female. Heels and 5th digit visualized.          Technically difficult due to fetal position. ---------------------------------------------------------------------- Cervix Uterus Adnexa  Cervix  Length:           3.44  cm.  Normal appearance by transabdominal scan.  Uterus  No abnormality visualized.  Left Ovary  Within normal limits.  Right Ovary  Not visualized.  Adnexa:       No  abnormality visualized. No adnexal mass                visualized. ---------------------------------------------------------------------- Impression  SIUP at 19+0 weeks  Normal detailed fetal anatomy; no structural fetal  abnormalities were identified to explain the elevated MSAFP  (specifically, the spine, posterior fossa and abdominal wall  were seen and appeared normal)  Markers of aneuploidy: none  Normal amniotic fluid volume  Measurements consistent with LMP dating  [facility] again visualized along the right lateral uterine wall  EV views of cervix: inferior edge of placenta covers the  internal os along with some small areas of hemorrhage  Placenta: fundal, left lateral; previa  Previa precautions given. After meeting with Santiago Glad, Ms.  [place] decided to have cell free DNA screening. ---------------------------------------------------------------------- Recommendations  Follow-up ultrasound for growth and to assess placental  location in 4 weeks ----------------------------------------------------------------------                 Renella Cunas, MD Electronically Signed Final Report   02/20/2017 07:36 pm ----------------------------------------------------------------------   Scheduled Meds: . aspirin EC  81 mg Oral Daily  . cholecalciferol  4,000 Units Oral Daily  . clobetasol cream  1 application Topical BID  . enoxaparin (LOVENOX) injection  40 mg Subcutaneous Q24H  . folic acid  5 mg Oral Daily  . HYDROmorphone   Intravenous Q4H  . oxyCODONE  10 mg Oral Q6H  . prenatal multivitamin  1 tablet Oral Q1200  . senna-docusate  1 tablet Oral BID   Continuous Infusions: . sodium chloride    . dextrose 5 % and 0.45% NaCl 100 mL/hr at 03/03/17 1711  . 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 Greater than 50% involved face to face contact with the patient for  assessment, counseling and coordination of care.

## 2017-03-04 LAB — PREPARE RBC (CROSSMATCH)

## 2017-03-04 MED ORDER — SODIUM CHLORIDE 0.9 % IV SOLN
Freq: Once | INTRAVENOUS | Status: AC
  Administered 2017-03-04: 17:00:00 via INTRAVENOUS

## 2017-03-04 MED ORDER — HYDROMORPHONE HCL 1 MG/ML IJ SOLN
0.5000 mg | Freq: Once | INTRAMUSCULAR | Status: AC
  Administered 2017-03-04: 0.5 mg via INTRAVENOUS
  Filled 2017-03-04: qty 1

## 2017-03-04 NOTE — Progress Notes (Signed)
Patient currently has one IV site that has PCA infusing. Patient states she is in too much pain to turn off PCA while blood infuses. IV team consult initiated for new IV site.

## 2017-03-04 NOTE — Progress Notes (Addendum)
Patient  yelling out and crying in pain. On call hospitialist paged New order given for 1 time dose of dilaudid.

## 2017-03-04 NOTE — Progress Notes (Signed)
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 Hb SS with Crisis: Continue PCA at current dose of 0.5 mg, lockout of 10 minutes and 1 hour limit of 3 mg. .  2. Anemia of Chronic Disease: She has had a decrease in Hb to 6.8 g/dL. In light of her pregnancy and worsening pain, I have ordered a transfusion of 1 unit RBC's.  3. 2nd Trimester Pregnancy: Noted that Obstetrics contacted and Dr. Nehemiah Settle advised that at [place] fetus does not need to be monitored.  4. Chronic Leg Ulcer: Continue Santyl.  Code Status: Full Code Family Communication: N/A Disposition Plan: Not yet ready for discharge  [place].  Pager [phone]. If 7PM-7AM, please contact night-coverage.  03/04/2017, 6:38 PM  LOS: 2 days   Interim History: Pt reports pain still increased at 9/10 and localized to RLE. RBC transfusion was ordered this morning and patient is receiving transfusion at present. She has not maximized use of the PCA.    Consultants:  Obstetrics  Procedures:  None  Antibiotics:  None   Objective: Vitals:   03/04/17 1352 03/04/17 1600 03/04/17 1630 03/04/17 1655  BP: 126/68  126/74 131/76  Pulse: 87  89 91  Resp: 19 19 18 18   Temp: 98.5 F (36.9 C)  98.6 F (37 C) 98.1 F (36.7 C)  TempSrc: Oral  Oral Oral  SpO2: 97% 98% 97% 99%  Weight:      Height:       Weight change: 1.325 kg (2 lb 14.7 oz)  Intake/Output Summary (Last 24 hours) at 03/04/17 1838 Last data filed at 03/04/17 1300  Gross per 24 hour  Intake          2901.67 ml  Output                0 ml  Net          2901.67 ml    .  Physical Exam General: Alert, awake, oriented x3, in moderate distress.  HEENT: [place]/AT PEERL, EOMI, mild icterus.  Neck: Trachea midline,  no masses, no thyromegal,y no JVD, no carotid bruit OROPHARYNX:  Moist, No  exudate/ erythema/lesions.  Heart: Regular rate and rhythm, II/VI non-radiating murmur at base.  Np rubs, gallops, PMI non-displaced, no heaves or thrills on palpation.  Lungs: Clear to auscultation, no wheezing or rhonchi noted. No increased vocal fremitus resonant to percussion  Abdomen: Gravid abdomen. Neuro: No focal neurological deficits noted cranial nerves II through XII grossly intact. Strength at baseline in bilateral upper and lower extremities. Musculoskeletal: No warmth swelling or erythema around joints, no spinal tenderness noted. Psychiatric: Patient alert and oriented x3, good insight and cognition, good recent to remote recall.    Data Reviewed: Basic Metabolic Panel:  Recent Labs Lab 03/02/17 0520  NA 137  K 3.5  CL 108  CO2 22  GLUCOSE 101*  BUN <5*  CREATININE <0.30*  CALCIUM 9.0   Liver Function Tests:  Recent Labs Lab 03/02/17 0520  AST 56*  ALT 22  ALKPHOS 74  BILITOT 3.3*  PROT 7.2  ALBUMIN 3.3*   No results for input(s): LIPASE, AMYLASE in the last 168 hours. No results for input(s): AMMONIA in the last 168 hours. CBC:  Recent Labs Lab 03/02/17 0520 03/03/17 1242  WBC 19.3* 16.8*  NEUTROABS 13.5* 12.6*  HGB 7.6* 6.8*  HCT  20.4* 18.6*  MCV 87.2 88.6  PLT 440* 377   Cardiac Enzymes: No results for input(s): CKTOTAL, CKMB, CKMBINDEX, TROPONINI in the last 168 hours. BNP (last 3 results)  Recent Labs  08/15/16 2007  BNP 48.3    ProBNP (last 3 results) No results for input(s): PROBNP in the last 8760 hours.  CBG: No results for input(s): GLUCAP in the last 168 hours.  No results found for this or any previous visit (from the past 240 hour(s)).   Studies: [place] Mfm Ob Transvaginal  Result Date: 02/20/2017 ----------------------------------------------------------------------  OBSTETRICS REPORT                      (Signed Final 02/20/2017 07:36 pm) ---------------------------------------------------------------------- Patient  Info  ID #:       [id number]                          D.O.B.:  13-May-2071 (46 yrs)  Name:       [name]                  Visit Date: 02/20/2017 10:40 am ---------------------------------------------------------------------- Performed By  Performed By:     Elisabeth [name]        Secondary Phy.:   [place] for                                                             [place]  Attending:        Renella Cunas MD       Address:          [facility]                                                             [address]  [place]  Referred By:      Sallyanne Havers               Location:         [facility] MD  Ref. Address:     [place], [place] ---------------------------------------------------------------------- Orders   #  Description                                 Code   1  [place] MFM OB DETAIL +[address] [place]                     76811.01   2  [place] MFM OB TRANSVAGINAL                      40102.7  ----------------------------------------------------------------------   #  Ordered By               Order #        Accession #    Episode #   1  [name]           [id number]      [id number]     [id number]   2  [name]           [id number]      [id number]     [id number]  ---------------------------------------------------------------------- Indications   [redacted] weeks gestation of pregnancy                Z3A.19   Encounter for antenatal screening for          Z36.3   malformations    Advanced maternal age multigravida 46+         O46.522   (54), second trimester; quad screen + for T21   Poor obstetric history: Previous IUFD          O78.299   (stillbirth) @ 28wks   Poor obstetric history: Previous preterm       O09.219   delivery, antepartum   Poor obstetric history: Previous               O09.299   preeclampsia / eclampsia/gestational HTN   Abnormal biochemical screen (quad);  O28.9   elevated MSAFP (7.9 MoM) and inhibin (4.6   MoM)   Previous cesarean delivery, antepartum (X 2)   O34.219   Maternal sickle cell anemia, second trimester  O99.012, D57.1   Vaginal bleeding in pregnancy, second          O46.92   trimester  ---------------------------------------------------------------------- OB History  Gravidity:    8         Term:   1        Prem:   3        SAB:   3  Living:       2 ---------------------------------------------------------------------- Fetal Evaluation  Num Of Fetuses:     1  Fetal Heart         157  Rate(bpm):  Cardiac Activity:   Observed  Presentation:       Variable  P. Cord Insertion:  Visualized, central  Amniotic Fluid  AFI FV:      Subjectively within normal limits                              Largest Pocket(cm)                              5.35 ---------------------------------------------------------------------- Biometry  BPD:      46.1  mm     G. Age:  20w 0d         86  %    CI:        71.22   %    70 - 86                                                          FL/HC:      17.9   %    16.1 - 18.3  HC:       174   mm     G. Age:  19w 6d         83  %    HC/AC:      1.12        1.09 - 1.39  AC:      154.7  mm     G. Age:  20w 5d         91  %    FL/BPD:     67.5   %  FL:       31.1  mm     G. Age:  19w 4d         66  %    FL/AC:      20.1   %    20 - 24  HUM:      30.1  mm     G. Age:  20w 0d         74  %  CER:      19.1  mm     G. Age:  18w 4d         37  %  NFT:         4  mm  CM:          5  mm  Est. FW:     336  gm  0 lb 12 oz      62  %  ---------------------------------------------------------------------- Gestational Age  LMP:           19w 0d        Date:  10/10/16                 EDD:   07/17/17  U/S Today:     20w 0d                                        EDD:   07/10/17  Best:          19w 0d     Det. By:  LMP  (10/10/16)          EDD:   07/17/17 ---------------------------------------------------------------------- Anatomy  Cranium:               Appears normal         Aortic Arch:            Appears normal  Cavum:                 Appears normal         Ductal Arch:            Appears normal  Ventricles:            Appears normal         Diaphragm:              Appears normal  Choroid Plexus:        Appears normal         Stomach:                Appears normal, left                                                                        sided  Cerebellum:            Appears normal         Abdomen:                Appears normal  Posterior Fossa:       Appears normal         Abdominal Wall:         Appears nml (cord                                                                        insert, abd wall)  Nuchal Fold:           Appears normal         Cord Vessels:           Appears normal (3  vessel cord)  Face:                  Appears normal         Kidneys:                Appear normal                         (orbits and profile)  Lips:                  Appears normal         Bladder:                Appears normal  Thoracic:              Appears normal         Spine:                  Appears normal  Heart:                 Appears normal         Upper Extremities:      Appears normal                         (4CH, axis, and                         situs)  RVOT:                  Appears normal         Lower Extremities:      Appears normal  LVOT:                  Appears normal  Other:  Fetus appears to be a female. Heels and 5th digit visualized.          Technically difficult  due to fetal position. ---------------------------------------------------------------------- Cervix Uterus Adnexa  Cervix  Length:           3.44  cm.  Normal appearance by transabdominal scan.  Uterus  No abnormality visualized.  Left Ovary  Within normal limits.  Right Ovary  Not visualized.  Adnexa:       No abnormality visualized. No adnexal mass                visualized. ---------------------------------------------------------------------- Impression  SIUP at 19+0 weeks  Normal detailed fetal anatomy; no structural fetal  abnormalities were identified to explain the elevated MSAFP  (specifically, the spine, posterior fossa and abdominal wall  were seen and appeared normal)  Markers of aneuploidy: none  Normal amniotic fluid volume  Measurements consistent with LMP dating  [facility] again visualized along the right lateral uterine wall  EV views of cervix: inferior edge of placenta covers the  internal os along with some small areas of hemorrhage  Placenta: fundal, left lateral; previa  Previa precautions given. After meeting with Santiago Glad, Ms.  Audino decided to have cell free DNA screening. ---------------------------------------------------------------------- Recommendations  Follow-up ultrasound for growth and to assess placental  location in 4 weeks ----------------------------------------------------------------------                 Renella Cunas, MD Electronically Signed Final Report   02/20/2017 07:36 pm ----------------------------------------------------------------------  [place] Mfm Ob Detail +14 Wk  Result Date: 02/20/2017 ----------------------------------------------------------------------  OBSTETRICS REPORT                      (  Signed Final 02/20/2017 07:36 pm) ---------------------------------------------------------------------- Patient Info  ID #:       [id number]                          D.O.B.:  2070/10/29 (46 yrs)  Name:       [name]                  Visit Date: 02/20/2017 10:40 am  ---------------------------------------------------------------------- Performed By  Performed By:     Elisabeth [name]        Secondary Phy.:   [place] for                                                             [place]  Attending:        Renella Cunas MD       Address:          [facility]                                                             [place], [place]  [address]  Referred By:      Sallyanne Havers               Location:         [facility] MD  Ref. Address:     [place], [place] ---------------------------------------------------------------------- Orders   #  Description                                 Code   1  [place] MFM OB DETAIL +14 [place]                     76811.01   2  [place] MFM OB TRANSVAGINAL                      06269.4  ----------------------------------------------------------------------   #  Ordered By               Order #        Accession #    Episode #   1  [name]           [id number]      [id number]     [id number]   2  [name]           [id number]      [id number]     [id number]  ---------------------------------------------------------------------- Indications   [redacted] weeks gestation of pregnancy                Z3A.19   Encounter for antenatal screening for          Z36.3   malformations   Advanced maternal age multigravida 26+         O45.522   (44), second trimester; quad screen + for T21   Poor obstetric history: Previous IUFD           O45.299   (stillbirth) @ 28wks   Poor obstetric history: Previous preterm       O09.219   delivery, antepartum   Poor obstetric history: Previous               O09.299   preeclampsia / eclampsia/gestational HTN   Abnormal biochemical screen (quad);            O28.9   elevated MSAFP (7.9 MoM) and inhibin (4.6   MoM)   Previous cesarean delivery, antepartum (X 2)   O34.219   Maternal sickle cell anemia, second trimester  O99.012, D57.1   Vaginal bleeding in pregnancy, second          O46.92   trimester  ---------------------------------------------------------------------- OB History  Gravidity:    8         Term:   1        Prem:   3        SAB:   3  Living:       2 ---------------------------------------------------------------------- Fetal Evaluation  Num Of Fetuses:     1  Fetal Heart         157  Rate(bpm):  Cardiac Activity:   Observed  Presentation:       Variable  P. Cord Insertion:  Visualized, central  Amniotic Fluid  AFI FV:      Subjectively within normal limits                              Largest Pocket(cm)                              5.35 ---------------------------------------------------------------------- Biometry  BPD:      46.1  mm     G. Age:  20w 0d         86  %    CI:        71.22   %    70 - 86                                                          FL/HC:      17.9   %    16.1 - 18.3  HC:       174   mm     G. Age:  19w 6d         83  %    HC/AC:      1.12        1.09 - 1.39  AC:      154.7  mm     G. Age:  20w 5d         91  %    FL/BPD:     67.5   %  FL:       31.1  mm     G. Age:  19w 4d         66  %    FL/AC:      20.1   %    20 - 24  HUM:      30.1  mm     G. Age:  20w 0d         74  %  CER:      19.1  mm     G. Age:  18w 4d         37  %  NFT:         4  mm  CM:          5  mm  Est. FW:     336  gm    0 lb 12 oz      62  % ---------------------------------------------------------------------- Gestational Age  LMP:           19w 0d        Date:  10/10/16                 EDD:    07/17/17  U/S Today:     20w 0d                                        EDD:   07/10/17  Best:  19w 0d     Det. By:  LMP  (10/10/16)          EDD:   07/17/17 ---------------------------------------------------------------------- Anatomy  Cranium:               Appears normal         Aortic Arch:            Appears normal  Cavum:                 Appears normal         Ductal Arch:            Appears normal  Ventricles:            Appears normal         Diaphragm:              Appears normal  Choroid Plexus:        Appears normal         Stomach:                Appears normal, left                                                                        sided  Cerebellum:            Appears normal         Abdomen:                Appears normal  Posterior Fossa:       Appears normal         Abdominal Wall:         Appears nml (cord                                                                        insert, abd wall)  Nuchal Fold:           Appears normal         Cord Vessels:           Appears normal (3                                                                        vessel cord)  Face:                  Appears normal         Kidneys:                Appear normal                         (orbits and profile)  Lips:  Appears normal         Bladder:                Appears normal  Thoracic:              Appears normal         Spine:                  Appears normal  Heart:                 Appears normal         Upper Extremities:      Appears normal                         (4CH, axis, and                         situs)  RVOT:                  Appears normal         Lower Extremities:      Appears normal  LVOT:                  Appears normal  Other:  Fetus appears to be a female. Heels and 5th digit visualized.          Technically difficult due to fetal position. ---------------------------------------------------------------------- Cervix Uterus Adnexa  Cervix  Length:           3.44  cm.   Normal appearance by transabdominal scan.  Uterus  No abnormality visualized.  Left Ovary  Within normal limits.  Right Ovary  Not visualized.  Adnexa:       No abnormality visualized. No adnexal mass                visualized. ---------------------------------------------------------------------- Impression  SIUP at 19+0 weeks  Normal detailed fetal anatomy; no structural fetal  abnormalities were identified to explain the elevated MSAFP  (specifically, the spine, posterior fossa and abdominal wall  were seen and appeared normal)  Markers of aneuploidy: none  Normal amniotic fluid volume  Measurements consistent with LMP dating  [facility] again visualized along the right lateral uterine wall  EV views of cervix: inferior edge of placenta covers the  internal os along with some small areas of hemorrhage  Placenta: fundal, left lateral; previa  Previa precautions given. After meeting with Santiago Glad, Ms.  [place]no[place] decided to have cell free DNA screening. ---------------------------------------------------------------------- Recommendations  Follow-up ultrasound for growth and to assess placental  location in 4 weeks ----------------------------------------------------------------------                 Renella Cunas, MD Electronically Signed Final Report   02/20/2017 07:36 pm ----------------------------------------------------------------------   Scheduled Meds: . aspirin EC  81 mg Oral Daily  . cholecalciferol  4,000 Units Oral Daily  . clobetasol cream  1 application Topical BID  . enoxaparin (LOVENOX) injection  40 mg Subcutaneous Q24H  . folic acid  5 mg Oral Daily  . HYDROmorphone   Intravenous Q4H  . oxyCODONE  10 mg Oral Q6H  . prenatal multivitamin  1 tablet Oral Q1200  . senna-docusate  1 tablet Oral BID   Continuous Infusions: . sodium chloride    . dextrose 5 % and 0.45% NaCl 100 mL/hr at 03/04/17 1203  . diphenhydrAMINE (BENADRYL) IVPB(SICKLE CELL ONLY)      Active Problems:  Hb-SS disease with  vaso-occlusive crisis ([place])    In excess of 35 minutes spent during this visit. Greater than 50% involved face to face contact with the patient for assessment, counseling and coordination of care.

## 2017-03-04 NOTE — Plan of Care (Signed)
Received call from [facility] 3rd floor who says they have a pt admitted for sickle cell crisis at [place] and who has not been monitored. Dr Nehemiah Settle called by rapid response nurse and was relayed this info.  MD says patient does not need any monitoring at this gestation.  [facility] called and notified of this.

## 2017-03-05 DIAGNOSIS — O4402 Placenta previa specified as without hemorrhage, second trimester: Secondary | ICD-10-CM

## 2017-03-05 LAB — TYPE AND SCREEN
ABO/RH(D): B POS
ANTIBODY SCREEN: NEGATIVE
UNIT DIVISION: 0

## 2017-03-05 LAB — BPAM RBC
BLOOD PRODUCT EXPIRATION DATE: 201810302359
ISSUE DATE / TIME: 201810161629
Unit Type and Rh: 9500

## 2017-03-05 MED ORDER — HYDROMORPHONE HCL 1 MG/ML IJ SOLN
1.0000 mg | INTRAMUSCULAR | Status: AC
Start: 2017-03-05 — End: 2017-03-06
  Administered 2017-03-05 – 2017-03-06 (×5): 1 mg via INTRAVENOUS
  Filled 2017-03-05 (×4): qty 1

## 2017-03-05 MED ORDER — HYDROMORPHONE HCL 1 MG/ML IJ SOLN
1.0000 mg | INTRAMUSCULAR | Status: DC | PRN
  Administered 2017-03-06: 1 mg via INTRAVENOUS
  Filled 2017-03-05: qty 1

## 2017-03-05 NOTE — Progress Notes (Signed)
Pt with persistent brown discharge. Pt states this has been an ongoing issue, MD paged. No frank blood noted. No abdominal pain/cramping. Per Dr. Si Raider, continue to monitor and notify for any changes, pt's OB history are consistent with these clinical manifestations.

## 2017-03-05 NOTE — Progress Notes (Signed)
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 Hb SS with Crisis: Continue PCA at current dose. Will also schedule intermittent doses of Dilaudid throughout the night and then intermittently particularly in light of the fact that she is unable to receive NSAID's due tyo pregnancy.  2. Anemia of Chronic Disease: She has had a decrease in Hb to 6.8 g/dL. In light of her pregnancy and worsening pain, I have ordered a transfusion of 1 unit RBC's.  3. 2nd Trimester Pregnancy with increased vaginal bleeding: I have consulted Dr. Glo Herring from Obstetrics.  4. Chronic Leg Ulcer: Continue Santyl.  Code Status: Full Code Family Communication: N/A Disposition Plan: Not yet ready for discharge  [name].  Pager [phone]. If 7PM-7AM, please contact night-coverage.  03/05/2017, 5:34 PM  LOS: 3 days   Interim History: Pt reports that she is having more vaginal bleeding than she had been having. She also reports that she was told that she has placenta previa and that she was told that she should not lift heavy objects. I have reviewed her record that is available in EPIC and although it states that guidelines were reviewed, it does not define what those guidelines were. Pt as a h/o pre-eclampsia in previous pregnancy. BP currently within normal limits;   Today her  pain still  at 9/10 and localized to RLE despite transfusion of 1 unit RBC. She has used 29.5 mgm with 66/58:demands/deliveries in the last 24 hours. Pt states that she is sleeping due to the pain but that when she awakens, the pain has escalated even more.   Consultants:  Obstetrics- Dr. Glo Herring  Procedures:  None  Antibiotics:  None   Objective: Vitals:   03/05/17 1141 03/05/17 1352 03/05/17 1435 03/05/17 1526  BP:   (!) 123/58   Pulse: 96 98 (!) 106    Resp:   17 14  Temp:   98.8 F (37.1 C)   TempSrc:   Oral   SpO2: 99% 100% 95% 93%  Weight:      Height:       Weight change:   Intake/Output Summary (Last 24 hours) at 03/05/17 1734 Last data filed at 03/05/17 1500  Gross per 24 hour  Intake             4285 ml  Output                0 ml  Net             4285 ml    .  Physical Exam General: Alert, awake, oriented x3, in moderate to severe distress.  HEENT: [place]/AT PEERL, EOMI, mild icterus.  Heart: Regular rate and rhythm, II/VI non-radiating murmur at base.  Np rubs, gallops, PMI non-displaced, no heaves or thrills on palpation.  Lungs: Clear to auscultation, no wheezing or rhonchi noted. No increased vocal fremitus resonant to percussion  Abdomen: Gravid abdomen. Neuro: No focal neurological deficits noted cranial nerves II through XII grossly intact. Strength at baseline in bilateral upper and lower extremities. Musculoskeletal: No warmth swelling or erythema around joints, no spinal tenderness noted. Psychiatric: Patient alert and oriented x3, good insight and cognition, good recent to remote recall.    Data Reviewed: Basic Metabolic Panel:  Recent Labs Lab 03/02/17 0520  NA 137  K 3.5  CL 108  CO2 22  GLUCOSE 101*  BUN <5*  CREATININE <0.30*  CALCIUM 9.0   Liver Function Tests:  Recent Labs Lab 03/02/17 0520  AST 56*  ALT 22  ALKPHOS 74  BILITOT 3.3*  PROT 7.2  ALBUMIN 3.3*   No results for input(s): LIPASE, AMYLASE in the last 168 hours. No results for input(s): AMMONIA in the last 168 hours. CBC:  Recent Labs Lab 03/02/17 0520 03/03/17 1242  WBC 19.3* 16.8*  NEUTROABS 13.5* 12.6*  HGB 7.6* 6.8*  HCT 20.4* 18.6*  MCV 87.2 88.6  PLT 440* 377   Cardiac Enzymes: No results for input(s): CKTOTAL, CKMB, CKMBINDEX, TROPONINI in the last 168 hours. BNP (last 3 results)  Recent Labs  08/15/16 2007  BNP 48.3    ProBNP (last 3 results) No results for input(s): PROBNP in the last 8760  hours.  CBG: No results for input(s): GLUCAP in the last 168 hours.  No results found for this or any previous visit (from the past 240 hour(s)).   Studies: [place] Mfm Ob Transvaginal  Result Date: 02/20/2017 ----------------------------------------------------------------------  OBSTETRICS REPORT                      (Signed Final 02/20/2017 07:36 pm) ---------------------------------------------------------------------- Patient Info  ID #:       [id number]                          D.O.B.:  Jul 27, 2070 (46 yrs)  Name:       [name]                  Visit Date: 02/20/2017 10:40 am ---------------------------------------------------------------------- Performed By  Performed By:     Elisabeth [name]        Secondary Phy.:   [facility]                                                             [place]  Attending:        Renella Cunas MD       Address:          [facility]                                                             [place], [place]  Referred By:      Sallyanne Havers               Location:         [facility] MD  Ref. Address:     [place], [place] ---------------------------------------------------------------------- Orders   #  Description                                 Code   1  [place] MFM OB DETAIL +14 [place]                     76811.01   2  [place] MFM OB TRANSVAGINAL                      40375.4   ----------------------------------------------------------------------   #  Ordered By               Order #        Accession #    Episode #   1  [name]           [id number]      [id number]     [id number]   2  [name]           [id number]      [id number]     [id number]  ---------------------------------------------------------------------- Indications   [redacted] weeks gestation of pregnancy                Z3A.[place]  Encounter for antenatal screening for          Z36.3   malformations   Advanced maternal age multigravida 54+         O29.522   (64), second trimester; quad screen + for T21   Poor obstetric history: Previous IUFD          O67.299   (stillbirth) @ 28wks   Poor obstetric history: Previous preterm       O09.219   delivery, antepartum   Poor obstetric history: Previous               O09.299   preeclampsia / eclampsia/gestational HTN   Abnormal biochemical screen (quad);            O28.9   elevated MSAFP (7.9 MoM) and inhibin (4.6   MoM)   Previous cesarean delivery, antepartum (X 2)   O34.219   Maternal sickle cell anemia, second trimester  O99.012, D57.1   Vaginal bleeding in pregnancy, second          O46.92   trimester  ---------------------------------------------------------------------- OB History  Gravidity:    8         Term:   1        Prem:   3        SAB:   3  Living:       2 ---------------------------------------------------------------------- Fetal Evaluation  Num Of Fetuses:     1  Fetal Heart         157  Rate(bpm):  Cardiac Activity:   Observed  Presentation:       Variable  P. Cord Insertion:  Visualized, central  Amniotic Fluid  AFI FV:      Subjectively within normal limits                              Largest Pocket(cm)                              5.35 ---------------------------------------------------------------------- Biometry  BPD:      46.1  mm     G. Age:  20w 0d         86  %    CI:        71.22   %    70 - 86                                                          FL/HC:       17.9   %    16.1 - 18.3  HC:       174   mm     G. Age:  19w 6d         83  %    HC/AC:      1.12        1.09 - 1.39  AC:      154.7  mm     G. Age:  20w 5d         91  %    FL/BPD:     67.5   %  FL:       31.1  mm     G. Age:  19w  4d         66  %    FL/AC:      20.1   %    20 - 24  HUM:      30.1  mm     G. Age:  20w 0d         74  %  CER:      19.1  mm     G. Age:  18w 4d         37  %  NFT:         4  mm  CM:          5  mm  Est. FW:     336  gm    0 lb 12 oz      62  % ---------------------------------------------------------------------- Gestational Age  LMP:           19w 0d        Date:  10/10/16                 EDD:   07/17/17  U/S Today:     20w 0d                                        EDD:   07/10/17  Best:          19w 0d     Det. By:  LMP  (10/10/16)          EDD:   07/17/17 ---------------------------------------------------------------------- Anatomy  Cranium:               Appears normal         Aortic Arch:            Appears normal  Cavum:                 Appears normal         Ductal Arch:            Appears normal  Ventricles:            Appears normal         Diaphragm:              Appears normal  Choroid Plexus:        Appears normal         Stomach:                Appears normal, left                                                                        sided  Cerebellum:            Appears normal         Abdomen:                Appears normal  Posterior Fossa:       Appears normal         Abdominal Wall:         Appears nml (cord  insert, abd wall)  Nuchal Fold:           Appears normal         Cord Vessels:           Appears normal (3                                                                        vessel cord)  Face:                  Appears normal         Kidneys:                Appear normal                         (orbits and profile)  Lips:                  Appears normal         Bladder:                 Appears normal  Thoracic:              Appears normal         Spine:                  Appears normal  Heart:                 Appears normal         Upper Extremities:      Appears normal                         (4CH, axis, and                         situs)  RVOT:                  Appears normal         Lower Extremities:      Appears normal  LVOT:                  Appears normal  Other:  Fetus appears to be a female. Heels and 5th digit visualized.          Technically difficult due to fetal position. ---------------------------------------------------------------------- Cervix Uterus Adnexa  Cervix  Length:           3.44  cm.  Normal appearance by transabdominal scan.  Uterus  No abnormality visualized.  Left Ovary  Within normal limits.  Right Ovary  Not visualized.  Adnexa:       No abnormality visualized. No adnexal mass                visualized. ---------------------------------------------------------------------- Impression  SIUP at 19+0 weeks  Normal detailed fetal anatomy; no structural fetal  abnormalities were identified to explain the elevated MSAFP  (specifically, the spine, posterior fossa and abdominal wall  were seen and appeared normal)  Markers of aneuploidy: none  Normal amniotic fluid volume  Measurements consistent with LMP dating  [facility] again visualized along the right lateral uterine wall  EV views of cervix: inferior edge of placenta covers the  internal os along with some small areas of hemorrhage  Placenta: fundal, left lateral; previa  Previa precautions given. After meeting with Santiago Glad, Ms.  Mahajan decided to have cell free DNA screening. ---------------------------------------------------------------------- Recommendations  Follow-up ultrasound for growth and to assess placental  location in 4 weeks ----------------------------------------------------------------------                 Renella Cunas, MD Electronically Signed Final Report   02/20/2017 07:36 pm  ----------------------------------------------------------------------  [place] Mfm Ob Detail +14 Wk  Result Date: 02/20/2017 ----------------------------------------------------------------------  OBSTETRICS REPORT                      (Signed Final 02/20/2017 07:36 pm) ---------------------------------------------------------------------- Patient Info  ID #:       [id number]                          D.O.B.:  [birth year] (46 yrs)  Name:       [name]                  Visit Date: 02/20/2017 10:40 am ---------------------------------------------------------------------- Performed By  Performed By:     Elisabeth [name]        Secondary Phy.:   [place] [facility]                                                             [place]  Attending:        Renella Cunas MD       Address:          [facility]                                                             [address]  [place], [place]  Referred By:      Sallyanne Havers               Location:         [facility] MD  Ref. Address:     [place], [place] ---------------------------------------------------------------------- Orders   #  Description                                 Code   1  [place] MFM OB DETAIL +14 [place]                     76811.01   2  [place] MFM OB TRANSVAGINAL                      41937.9  ----------------------------------------------------------------------   #  Ordered By               Order #        Accession #    Episode #    1  [name]           [id number]      [id number]     [id number]   2  [name]           [id number]      [id number]     [id number]  ---------------------------------------------------------------------- Indications   [redacted] weeks gestation of pregnancy                Z3A.19   Encounter for antenatal screening for          Z36.3   malformations   Advanced maternal age multigravida 98+         O49.522   (35), second trimester; quad screen + for T21   Poor obstetric history: Previous IUFD          O41.299   (stillbirth) @ 28wks   Poor obstetric history: Previous preterm       O09.219   delivery, antepartum   Poor obstetric history: Previous               O09.299   preeclampsia / eclampsia/gestational HTN   Abnormal biochemical screen (quad);  O28.9   elevated MSAFP (7.9 MoM) and inhibin (4.6   MoM)   Previous cesarean delivery, antepartum (X 2)   O34.219   Maternal sickle cell anemia, second trimester  O99.012, D57.1   Vaginal bleeding in pregnancy, second          O46.92   trimester  ---------------------------------------------------------------------- OB History  Gravidity:    8         Term:   1        Prem:   3        SAB:   3  Living:       2 ---------------------------------------------------------------------- Fetal Evaluation  Num Of Fetuses:     1  Fetal Heart         157  Rate(bpm):  Cardiac Activity:   Observed  Presentation:       Variable  P. Cord Insertion:  Visualized, central  Amniotic Fluid  AFI FV:      Subjectively within normal limits                              Largest Pocket(cm)                              5.35 ---------------------------------------------------------------------- Biometry  BPD:      46.1  mm     G. Age:  20w 0d         86  %    CI:        71.22   %    70 - 86                                                          FL/HC:      17.9   %    16.1 - 18.3  HC:       174   mm     G. Age:  19w 6d         83  %    HC/AC:      1.12        1.09 - 1.39  AC:      154.7  mm      G. Age:  20w 5d         91  %    FL/BPD:     67.5   %  FL:       31.1  mm     G. Age:  19w 4d         66  %    FL/AC:      20.1   %    20 - 24  HUM:      30.1  mm     G. Age:  20w 0d         74  %  CER:      19.1  mm     G. Age:  18w 4d         37  %  NFT:         4  mm  CM:          5  mm  Est. FW:     336  gm  0 lb 12 oz      62  % ---------------------------------------------------------------------- Gestational Age  LMP:           19w 0d        Date:  10/10/16                 EDD:   07/17/17  U/S Today:     20w 0d                                        EDD:   07/10/17  Best:          19w 0d     Det. By:  LMP  (10/10/16)          EDD:   07/17/17 ---------------------------------------------------------------------- Anatomy  Cranium:               Appears normal         Aortic Arch:            Appears normal  Cavum:                 Appears normal         Ductal Arch:            Appears normal  Ventricles:            Appears normal         Diaphragm:              Appears normal  Choroid Plexus:        Appears normal         Stomach:                Appears normal, left                                                                        sided  Cerebellum:            Appears normal         Abdomen:                Appears normal  Posterior Fossa:       Appears normal         Abdominal Wall:         Appears nml (cord                                                                        insert, abd wall)  Nuchal Fold:           Appears normal         Cord Vessels:           Appears normal (3  vessel cord)  Face:                  Appears normal         Kidneys:                Appear normal                         (orbits and profile)  Lips:                  Appears normal         Bladder:                Appears normal  Thoracic:              Appears normal         Spine:                  Appears normal  Heart:                 Appears normal          Upper Extremities:      Appears normal                         (4CH, axis, and                         situs)  RVOT:                  Appears normal         Lower Extremities:      Appears normal  LVOT:                  Appears normal  Other:  Fetus appears to be a female. Heels and 5th digit visualized.          Technically difficult due to fetal position. ---------------------------------------------------------------------- Cervix Uterus Adnexa  Cervix  Length:           3.44  cm.  Normal appearance by transabdominal scan.  Uterus  No abnormality visualized.  Left Ovary  Within normal limits.  Right Ovary  Not visualized.  Adnexa:       No abnormality visualized. No adnexal mass                visualized. ---------------------------------------------------------------------- Impression  SIUP at 19+0 weeks  Normal detailed fetal anatomy; no structural fetal  abnormalities were identified to explain the elevated MSAFP  (specifically, the spine, posterior fossa and abdominal wall  were seen and appeared normal)  Markers of aneuploidy: none  Normal amniotic fluid volume  Measurements consistent with LMP dating  [facility] again visualized along the right lateral uterine wall  EV views of cervix: inferior edge of placenta covers the  internal os along with some small areas of hemorrhage  Placenta: fundal, left lateral; previa  Previa precautions given. After meeting with Santiago Glad, Ms.  Huq decided to have cell free DNA screening. ---------------------------------------------------------------------- Recommendations  Follow-up ultrasound for growth and to assess placental  location in 4 weeks ----------------------------------------------------------------------                 Renella Cunas, MD Electronically Signed Final Report   02/20/2017 07:36 pm ----------------------------------------------------------------------   Scheduled Meds: . aspirin EC  81 mg Oral Daily  . cholecalciferol  4,000 Units Oral Daily  .  clobetasol cream  1 application Topical BID  . enoxaparin (LOVENOX) injection  40 mg Subcutaneous Q24H  . folic acid  5 mg Oral Daily  . HYDROmorphone   Intravenous Q4H  .  HYDROmorphone (DILAUDID) injection  1 mg Intravenous Q3H  . oxyCODONE  10 mg Oral Q6H  . prenatal multivitamin  1 tablet Oral Q1200  . senna-docusate  1 tablet Oral BID   Continuous Infusions: . sodium chloride    . dextrose 5 % and 0.45% NaCl 100 mL/hr at 03/05/17 1122  . diphenhydrAMINE (BENADRYL) IVPB(SICKLE CELL ONLY)      Active Problems:   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HCC)    In excess of 25 minutes spent during this visit. Greater than 50% involved face to face contact with the patient for assessment, counseling and coordination of care.

## 2017-03-06 DIAGNOSIS — Z3A21 21 weeks gestation of pregnancy: Secondary | ICD-10-CM

## 2017-03-06 DIAGNOSIS — O468X2 Other antepartum hemorrhage, second trimester: Secondary | ICD-10-CM

## 2017-03-06 DIAGNOSIS — K5900 Constipation, unspecified: Secondary | ICD-10-CM

## 2017-03-06 DIAGNOSIS — O99012 Anemia complicating pregnancy, second trimester: Principal | ICD-10-CM

## 2017-03-06 LAB — CBC WITH DIFFERENTIAL/PLATELET
BASOS PCT: 0 %
Basophils Absolute: 0 10*3/uL (ref 0.0–0.1)
EOS PCT: 0 %
Eosinophils Absolute: 0 10*3/uL (ref 0.0–0.7)
HEMATOCRIT: 22.4 % — AB (ref 36.0–46.0)
Hemoglobin: 7.9 g/dL — ABNORMAL LOW (ref 12.0–15.0)
LYMPHS ABS: 0.8 10*3/uL (ref 0.7–4.0)
Lymphocytes Relative: 4 %
MCH: 31.7 pg (ref 26.0–34.0)
MCHC: 35.3 g/dL (ref 30.0–36.0)
MCV: 90 fL (ref 78.0–100.0)
Monocytes Absolute: 2.9 10*3/uL — ABNORMAL HIGH (ref 0.1–1.0)
Monocytes Relative: 14 %
NEUTROS ABS: 17.1 10*3/uL — AB (ref 1.7–7.7)
Neutrophils Relative %: 82 %
Platelets: 383 10*3/uL (ref 150–400)
RBC: 2.49 MIL/uL — ABNORMAL LOW (ref 3.87–5.11)
RDW: 17.1 % — AB (ref 11.5–15.5)
WBC: 20.8 10*3/uL — AB (ref 4.0–10.5)
nRBC: 13 /100 WBC — ABNORMAL HIGH

## 2017-03-06 LAB — BASIC METABOLIC PANEL
ANION GAP: 11 (ref 5–15)
BUN: <5 mg/dL — ABNORMAL LOW (ref 6–20)
CHLORIDE: 100 mmol/L — AB (ref 101–111)
CO2: 24 mmol/L (ref 22–32)
Calcium: 9.2 mg/dL (ref 8.9–10.3)
Creatinine, Ser: <0.3 mg/dL — ABNORMAL LOW (ref 0.44–1.00)
GLUCOSE: 109 mg/dL — AB (ref 65–99)
POTASSIUM: 2.6 mmol/L — AB (ref 3.5–5.1)
Sodium: 135 mmol/L (ref 135–145)

## 2017-03-06 LAB — RETICULOCYTES
RBC.: 2.49 MIL/uL — ABNORMAL LOW (ref 3.87–5.11)
Retic Count, Absolute: 565.2 10*3/uL — ABNORMAL HIGH (ref 19.0–186.0)
Retic Ct Pct: 22.7 % — ABNORMAL HIGH (ref 0.4–3.1)

## 2017-03-06 LAB — MAGNESIUM: Magnesium: 1.4 mg/dL — ABNORMAL LOW (ref 1.7–2.4)

## 2017-03-06 MED ORDER — MAGNESIUM SULFATE 2 GM/50ML IV SOLN
2.0000 g | Freq: Once | INTRAVENOUS | Status: AC
  Administered 2017-03-06: 2 g via INTRAVENOUS
  Filled 2017-03-06: qty 50

## 2017-03-06 MED ORDER — LORATADINE 10 MG PO TABS: 10.0000 mg | ORAL_TABLET | Freq: Every day | ORAL | Status: DC

## 2017-03-06 MED ORDER — SALINE SPRAY 0.65 % NA SOLN
1.0000 | NASAL | Status: DC | PRN
  Filled 2017-03-06: qty 44

## 2017-03-06 MED ORDER — POTASSIUM CHLORIDE CRYS ER 20 MEQ PO TBCR
20.0000 meq | EXTENDED_RELEASE_TABLET | Freq: Every day | ORAL | Status: DC
  Administered 2017-03-07 – 2017-03-11 (×5): 20 meq via ORAL
  Filled 2017-03-06 (×5): qty 1

## 2017-03-06 MED ORDER — POTASSIUM CHLORIDE 10 MEQ/100ML IV SOLN
10.0000 meq | INTRAVENOUS | Status: AC
  Administered 2017-03-06 (×2): 10 meq via INTRAVENOUS
  Filled 2017-03-06 (×2): qty 100

## 2017-03-06 MED ORDER — POTASSIUM CHLORIDE CRYS ER 20 MEQ PO TBCR
40.0000 meq | EXTENDED_RELEASE_TABLET | Freq: Two times a day (BID) | ORAL | Status: AC
Start: 2017-03-06 — End: 2017-03-06
  Administered 2017-03-06 (×2): 40 meq via ORAL
  Filled 2017-03-06 (×2): qty 2

## 2017-03-06 MED ORDER — HYDROMORPHONE HCL 1 MG/ML IJ SOLN
1.0000 mg | INTRAMUSCULAR | Status: AC
  Administered 2017-03-06 – 2017-03-07 (×4): 1 mg via INTRAVENOUS
  Filled 2017-03-06 (×4): qty 1

## 2017-03-06 MED ORDER — MAGNESIUM OXIDE 400 (241.3 MG) MG PO TABS
400.0000 mg | ORAL_TABLET | Freq: Every day | ORAL | Status: DC
  Administered 2017-03-07 – 2017-03-11 (×5): 400 mg via ORAL
  Filled 2017-03-06 (×5): qty 1

## 2017-03-06 MED ORDER — OXYCODONE HCL 5 MG PO TABS
10.0000 mg | ORAL_TABLET | ORAL | Status: DC
  Administered 2017-03-06 – 2017-03-11 (×31): 10 mg via ORAL
  Filled 2017-03-06 (×31): qty 2

## 2017-03-06 NOTE — Consult Note (Signed)
Reason for Consult:Vaginal bleeding   Referring Physician: Dr. Lerry Liner Kelso is an 46 y.o. female [id number] at 44w0dadmitted for sickle cell crisis. [facility] Prenatal care complicated by a history of preeclampsia, previous cesarean section x 2, advanced maternal age with normal genetic screening, subchorionic hematoma and partial placenta previa. Patient reports some vaginal bleeding daily since September. She states that the bleeding she is currently experiencing is unchanged since September. She characterizes it as dark brown and stains a pad. She denies any cramping pain or pelvic pressure. She denies any bright red blood. She reports some occasional fetal movement. She denies leakage of fluid.     Past Medical History:  Diagnosis Date  . Anemia 03/30/2012   Hx of sickle cell disease  . CAP (community acquired pneumonia)    2014  . Cholelithiasis y-1  . Cholelithiasis 03/30/2012  . Chronic wound of extremity 04/01/2012  . Elevated LFTs   . [place]   Chronic under care of wound clinic  . Multiple open wounds of lower extremity    chronic wounds B/LLE  . Sickle cell disease ([place]   . Sinus bradycardia by electrocardiogram 04/03/2012    Past Surgical History:  Procedure Laterality Date  . ALLOGRAFT APPLICATION Right 92/72/5366  Procedure: SURGICAL PREP FOR GRAFTING RIGHT LOWER EXTREMITY AND APPLICATION OF TJannifer Hick  Surgeon: BIrene Limbo MD;  [place]  Service: [place];  Laterality: Right;  . APPLICATION OF A-CELL OF EXTREMITY Right 10/09/2015   Procedure: APPLICATION OF TJannifer Hick  Surgeon: BIrene Limbo MD;  [place]  Service: [place];  Laterality: Right;  . BREAST SURGERY     left breast cyst aspiration  . CESAREAN SECTION    . CESAREAN SECTION N/A 01/06/2014   Procedure: CESAREAN SECTION;  Surgeon: MEmily Filbert MD;  [place]  [facility] [facility]  Service: Obstetrics;  Laterality: N/A;  . I&D EXTREMITY Right 10/09/2015   Procedure: SURGICAL PREPARATION FOR GRAFTING RIGHT ANKLE AND APPLICATION THERASKIN;  Surgeon: BIrene Limbo MD;  [place]  Service: Pl[place];  Laterality: Right;  . SKIN FULL THICKNESS GRAFT Bilateral 06/17/2016   Procedure: SURGICAL PREP FOR GRAFTING, BILATERAL LOWER EXTREMITIES AND APPLICATION OF TJannifer Hick  Surgeon: BIrene Limbo MD;  [place]  Service: [place];  Laterality: Bilateral;  . TONSILLECTOMY      Family History  Problem Relation Age of Onset  . Sickle cell anemia Sister   . Diabetes Mother   . Asthma Mother   . Sickle cell trait Mother   . Sickle cell trait Father   . Hypertension Father     Social History:  reports that she has never smoked. She has never used smokeless tobacco. She reports that she does not drink alcohol or use drugs.  Allergies: No Known Allergies  Medications: I have reviewed the patient's current medications.  ROS  Blood pressure 125/69, pulse (!) 109, temperature 100.1 F (37.8 C), temperature source Oral, resp. rate 19, height 5' 7" (1.702 m), weight 130 lb 9.6 oz (59.2 kg), last menstrual period 10/10/2016, SpO2 98 %. Physical Exam GENERAL: Well-developed, well-nourished female in no acute distress.  NECK: Supple. Normal thyroid.  LUNGS: Clear to auscultation bilaterally.  HEART: Regular rate and rhythm. ABDOMEN: Soft, nontender, gravid PELVIC: Normal external female genitalia. Vagina is pink and rugated. Dark blood evacuated from vaginal vault. No active bleeding visualized from the cervical os.  Gentle digital exam revealed a closed cervix EXTREMITIES: No cyanosis, clubbing, or edema, right foot bandaged from a chronic non pressure ulcer  Results for orders placed or performed during the hospital encounter of 03/02/17 (from the past 48 hour(s))  CBC with Differential/Platelet     Status: Abnormal   Collection  Time: 03/06/17  4:22 AM  Result Value Ref Range   WBC 20.8 (H) 4.0 - 10.5 K/uL    Comment: WHITE COUNT CONFIRMED ON SMEAR ADJUSTED FOR NUCLEATED RBC'S    RBC 2.49 (L) 3.87 - 5.11 MIL/uL   Hemoglobin 7.9 (L) 12.0 - 15.0 g/dL   HCT 22.4 (L) 36.0 - 46.0 %   MCV 90.0 78.0 - 100.0 fL   MCH 31.7 26.0 - 34.0 pg   MCHC 35.3 30.0 - 36.0 g/dL   RDW 17.1 (H) 11.5 - 15.5 %   Platelets 383 150 - 400 K/uL    Comment: REPEATED TO VERIFY SPECIMEN CHECKED FOR CLOTS PLATELET COUNT CONFIRMED BY SMEAR    Neutrophils Relative % 82 %   Lymphocytes Relative 4 %   Monocytes Relative 14 %   Eosinophils Relative 0 %   Basophils Relative 0 %   nRBC 13 (H) 0 /100 WBC   Neutro Abs 17.1 (H) 1.7 - 7.7 K/uL   Lymphs Abs 0.8 0.7 - 4.0 K/uL   Monocytes Absolute 2.9 (H) 0.1 - 1.0 K/uL   Eosinophils Absolute 0.0 0.0 - 0.7 K/uL   Basophils Absolute 0.0 0.0 - 0.1 K/uL   RBC Morphology POLYCHROMASIA PRESENT     Comment: TARGET CELLS HOWELL/JOLLY BODIES SICKLE CELLS   Reticulocytes     Status: Abnormal   Collection Time: 03/06/17  4:22 AM  Result Value Ref Range   Retic Ct Pct 22.7 (H) 0.4 - 3.1 %   RBC. 2.49 (L) 3.87 - 5.11 MIL/uL   Retic Count, Absolute 565.2 (H) 19.0 - 186.0 K/uL  Basic metabolic panel     Status: Abnormal   Collection Time: 03/06/17  4:22 AM  Result Value Ref Range   Sodium 135 135 - 145 mmol/L   Potassium 2.6 (LL) 3.5 - 5.1 mmol/L    Comment: CRITICAL RESULT CALLED TO, READ BACK BY AND VERIFIED WITH: B CHERRY RN 0530 03/06/17 A NAVARRO    Chloride 100 (L) 101 - 111 mmol/L   CO2 24 22 - 32 mmol/L   Glucose, Bld 109 (H) 65 - 99 mg/dL   BUN <5 (L) 6 - 20 mg/dL   Creatinine, Ser <0.30 (L) 0.44 - 1.00 mg/dL    Comment: RESULTS CONFIRMED BY MANUAL DILUTION ICTERUS AT THIS LEVEL MAY AFFECT RESULT    Calcium 9.2 8.9 - 10.3 mg/dL   GFR calc non Af Amer NOT CALCULATED >60 mL/min   GFR calc Af Amer NOT CALCULATED >60 mL/min    Comment: (NOTE) The eGFR has been calculated using the CKD  EPI equation. This calculation has not been validated in all clinical situations. eGFR's persistently <60 mL/min signify possible Chronic Kidney Disease.    Anion gap 11 5 - 15  Magnesium     Status: Abnormal   Collection Time: 03/06/17  4:22 AM  Result Value Ref Range   Magnesium 1.4 (L) 1.7 - 2.4 mg/dL    No results found. 10/4 transvaginal ultrasound Impression  SIUP at 19+0 weeks  Normal detailed fetal anatomy; no structural fetal  abnormalities were identified to explain the elevated MSAFP  (specifically, the spine, posterior fossa and abdominal wall  were seen  and appeared normal)  Markers of aneuploidy: none  Normal amniotic fluid volume  Measurements consistent with LMP dating  [facility] again visualized along the right lateral uterine wall  EV views of cervix: inferior edge of placenta covers the  internal os along with some small areas of hemorrhage  Placenta: fundal, left lateral; previa  Previa precautions given. After meeting with Santiago Glad, Ms.  Tomasello decided to have cell free DNA screening. ---------------------------------------------------------------------- Recommendations  Follow-up ultrasound for growth and to assess placental  location in 4 weeks  Assessment/Plan: 46 yo at 84w0dwith chronic vaginal bleeding likely related to a subchorionic hematoma - Reassurance provided to the patient - Patient informed of placenta previa as well. Bleeding precautions reviewed with the patient. Patient was also advised to inform nursing staff of episodes of bright red vaginal bleeding - Patient scheduled for repeat ultrasound 11/1 - Patient scheduled for follow up prenatal visit on 10/26 (please contact [place]dule if patient remains hospitalized) - Continue lovenox for DVT prophylaxis - Continue ASA given history of preeclampsia - Continue current care for the management of her sickle cell crisis and hypokalemia per medicine team - Please contact [place] any further  questions or concerns at [phone]   Peggy Constant 03/06/2017

## 2017-03-06 NOTE — Progress Notes (Signed)
K+ of 2.6 paged to on call for hospitalist

## 2017-03-06 NOTE — Progress Notes (Signed)
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 Hb SS with Crisis: Continue PCA at current dose. Will again schedule intermittent doses of Dilaudid for the next 16 hours and then re-evaluate. She is unable to receive Toradol due to pregnancy will consider adding scheduled Tylenol as long as okay with Ob-Gyn.  2. Hypokalemia: Replace IV and orally  3. Hypomagnesemia: Will replace by IV as patient refusing large pills or capsules today and I'm afraid that without adequate Magnesium her potassium levels will not be maintained.  4. Anemia of Chronic Disease: Hb today is 7.9 g/dL 1 day post transfusion of 1 unit RBC's. No indication for transfusion at this time 5. 2nd Trimester Pregnancy with increased vaginal bleeding: I have consulted  Obstetrics who saw her today and recommended fetal tones for reassurance. 6. Chronic Leg Ulcer: Continue Santyl.  Code Status: Full Code Family Communication: N/A Disposition Plan: Not yet ready for discharge  [place].  Pager [phone]. If 7PM-7AM, please contact night-coverage.  03/06/2017, 5:58 PM  LOS: 4 days   Interim History: Pt has been fearful about her vaginal bleeding even though she has been given guidelines and instruction by the Obstetrician. The bleeding is miniscule but she is very fearful.   Today her  pain still  at 9/10 and localized to RLE. She has used 27.47 mg of Dilaudid on the PCA with 59/53:demands/deliveries in the last 24 hours in addition to 6 mg Dilaudid by IVP. Nurse reports that she has to constantly remind patient to use PCA and that she seems to become completely helpless when staff goes into the room despite encouragement and reassurance.    Consultants:  Obstetrics- Dr. Glo Herring  Procedures:  None  Antibiotics:  None   Objective: Vitals:    03/06/17 1231 03/06/17 1542 03/06/17 1600 03/06/17 1713  BP:  118/72    Pulse:  (!) 118    Resp: 19 20 (!) 22 (!) 23  Temp:  99 F (37.2 C)    TempSrc:  Oral    SpO2: 98% 95% 92% 96%  Weight:      Height:       Weight change:   Intake/Output Summary (Last 24 hours) at 03/06/17 1758 Last data filed at 03/06/17 1600  Gross per 24 hour  Intake              420 ml  Output             1800 ml  Net            -1380 ml    .  Physical Exam General: Alert, awake, oriented x3, in moderate distress.  HEENT: [place]/AT PEERL, EOMI, mild icterus.  Heart: Regular rate and rhythm, II/VI non-radiating murmur at base.  Np rubs, gallops, PMI non-displaced, no heaves or thrills on palpation.  Lungs: Clear to auscultation, no wheezing or rhonchi noted. No increased vocal fremitus resonant to percussion  Abdomen: Gravid abdomen. Neuro: No focal neurological deficits noted cranial nerves II through XII grossly intact. Strength at baseline in bilateral upper and lower extremities. Musculoskeletal: No warmth swelling or erythema around joints, no spinal tenderness noted. Psychiatric: Patient alert and oriented x3, good insight and cognition, good recent to remote recall.    Data Reviewed: Basic Metabolic Panel:  Recent Labs Lab 03/02/17 0520 03/06/17 0422  NA 137 135  K 3.5 2.6*  CL 108 100*  CO2 22 24  GLUCOSE 101* 109*  BUN <5* <5*  CREATININE <0.30* <0.30*  CALCIUM 9.0 9.2  MG  --  1.4*   Liver Function Tests:  Recent Labs Lab 03/02/17 0520  AST 56*  ALT 22  ALKPHOS 74  BILITOT 3.3*  PROT 7.2  ALBUMIN 3.3*   No results for input(s): LIPASE, AMYLASE in the last 168 hours. No results for input(s): AMMONIA in the last 168 hours. CBC:  Recent Labs Lab 03/02/17 0520 03/03/17 1242 03/06/17 0422  WBC 19.3* 16.8* 20.8*  NEUTROABS 13.5* 12.6* 17.1*  HGB 7.6* 6.8* 7.9*  HCT 20.4* 18.6* 22.4*  MCV 87.2 88.6 90.0  PLT 440* 377 383   Cardiac Enzymes: No results for input(s):  CKTOTAL, CKMB, CKMBINDEX, TROPONINI in the last 168 hours. BNP (last 3 results)  Recent Labs  08/15/16 2007  BNP 48.3    ProBNP (last 3 results) No results for input(s): PROBNP in the last 8760 hours.  CBG: No results for input(s): GLUCAP in the last 168 hours.  No results found for this or any previous visit (from the past 240 hour(s)).   Studies: [place] Mfm Ob Transvaginal  Result Date: 02/20/2017 ----------------------------------------------------------------------  OBSTETRICS REPORT                      (Signed Final 02/20/2017 07:36 pm) ---------------------------------------------------------------------- Patient Info  ID #:       [id number]                          D.O.B.:  Dec 02, 2070 (46 yrs)  Name:       [name]                  Visit Date: 02/20/2017 10:40 am ---------------------------------------------------------------------- Performed By  Performed By:     Elisabeth [name]        Secondary Phy.:   [place] [facility]                                                             [place]  Attending:        Renella Cunas MD       Address:          [facility]                                                             [place], [place]  Referred By:      Sallyanne Havers               Location:         [facility] MD  Ref. Address:     [place], [place] ---------------------------------------------------------------------- Orders   #  Description                                  Code   1  [place] MFM OB DETAIL +14 [place]                     76811.01   2  [place] MFM OB TRANSVAGINAL                      67893.8  ----------------------------------------------------------------------   #  Ordered By               Order #        Accession #    Episode #   1  [name]           [id number]      [id number]     [id number]   2  [name]           [id number]      [id number]     [id number]  ---------------------------------------------------------------------- Indications   [redacted] weeks gestation of pregnancy                Z3A.[place]  Encounter for antenatal screening for          Z36.3   malformations   Advanced maternal age multigravida 23+         O31.522   (73), second trimester; quad screen + for T21   Poor obstetric history: Previous IUFD          O35.299   (stillbirth) @ 28wks   Poor obstetric history: Previous preterm       O09.219   delivery, antepartum   Poor obstetric history: Previous               O09.299   preeclampsia / eclampsia/gestational HTN   Abnormal biochemical screen (quad);            O28.9   elevated MSAFP (7.9 MoM) and inhibin (4.6   MoM)   Previous cesarean delivery, antepartum (X 2)   O34.219   Maternal sickle cell anemia, second trimester  O99.012, D57.1   Vaginal bleeding in pregnancy, second          O46.92   trimester  ---------------------------------------------------------------------- OB History  Gravidity:    8         Term:   1        Prem:   3        SAB:   3  Living:       2 ---------------------------------------------------------------------- Fetal Evaluation  Num Of Fetuses:     1  Fetal Heart         157  Rate(bpm):  Cardiac Activity:   Observed  Presentation:       Variable  P. Cord Insertion:  Visualized, central  Amniotic Fluid  AFI FV:      Subjectively within normal limits                              Largest Pocket(cm)                              5.35 ---------------------------------------------------------------------- Biometry   BPD:      46.1  mm     G. Age:  20w 0d         86  %    CI:        71.22   %    70 - 86                                                          FL/HC:      17.9   %    16.1 - 18.3  HC:       174   mm     G. Age:  19w 6d         83  %    HC/AC:      1.12        1.09 - 1.39  AC:      154.7  mm     G. Age:  20w 5d         91  %    FL/BPD:     67.5   %  FL:       31.1  mm     G. Age:  19w  4d         66  %    FL/AC:      20.1   %    20 - 24  HUM:      30.1  mm     G. Age:  20w 0d         74  %  CER:      19.1  mm     G. Age:  18w 4d         37  %  NFT:         4  mm  CM:          5  mm  Est. FW:     336  gm    0 lb 12 oz      62  % ---------------------------------------------------------------------- Gestational Age  LMP:           19w 0d        Date:  10/10/16                 EDD:   07/17/17  U/S Today:     20w 0d                                        EDD:   07/10/17  Best:          19w 0d     Det. By:  LMP  (10/10/16)          EDD:   07/17/17 ---------------------------------------------------------------------- Anatomy  Cranium:               Appears normal         Aortic Arch:            Appears normal  Cavum:                 Appears normal         Ductal Arch:            Appears normal  Ventricles:            Appears normal         Diaphragm:              Appears normal  Choroid Plexus:        Appears normal         Stomach:                Appears normal, left                                                                        sided  Cerebellum:            Appears normal         Abdomen:                Appears normal  Posterior Fossa:       Appears normal         Abdominal Wall:         Appears nml (cord  insert, abd wall)  Nuchal Fold:           Appears normal         Cord Vessels:           Appears normal (3                                                                        vessel cord)  Face:                  Appears normal          Kidneys:                Appear normal                         (orbits and profile)  Lips:                  Appears normal         Bladder:                Appears normal  Thoracic:              Appears normal         Spine:                  Appears normal  Heart:                 Appears normal         Upper Extremities:      Appears normal                         (4CH, axis, and                         situs)  RVOT:                  Appears normal         Lower Extremities:      Appears normal  LVOT:                  Appears normal  Other:  Fetus appears to be a female. Heels and 5th digit visualized.          Technically difficult due to fetal position. ---------------------------------------------------------------------- Cervix Uterus Adnexa  Cervix  Length:           3.44  cm.  Normal appearance by transabdominal scan.  Uterus  No abnormality visualized.  Left Ovary  Within normal limits.  Right Ovary  Not visualized.  Adnexa:       No abnormality visualized. No adnexal mass                visualized. ---------------------------------------------------------------------- Impression  SIUP at 19+0 weeks  Normal detailed fetal anatomy; no structural fetal  abnormalities were identified to explain the elevated MSAFP  (specifically, the spine, posterior fossa and abdominal wall  were seen and appeared normal)  Markers of aneuploidy: none  Normal amniotic fluid volume  Measurements consistent with LMP dating  [facility] again visualized along the right lateral uterine wall  EV views of cervix: inferior edge of placenta covers the  internal os along with some small areas of hemorrhage  Placenta: fundal, left lateral; previa  Previa precautions given. After meeting with Santiago Glad, Ms.  Mcduffee decided to have cell free DNA screening. ---------------------------------------------------------------------- Recommendations  Follow-up ultrasound for growth and to assess placental  location in 4 weeks  ----------------------------------------------------------------------                 Renella Cunas, MD Electronically Signed Final Report   02/20/2017 07:36 pm ----------------------------------------------------------------------  [place] Mfm Ob Detail +14 Wk  Result Date: 02/20/2017 ----------------------------------------------------------------------  OBSTETRICS REPORT                      (Signed Final 02/20/2017 07:36 pm) ---------------------------------------------------------------------- Patient Info  ID #:       [id number]                          D.O.B.:  May 28, 2070 (46 yrs)  Name:       [name]                  Visit Date: 02/20/2017 10:40 am ---------------------------------------------------------------------- Performed By  Performed By:     Elisabeth [name]        Secondary Phy.:   [place] for                                                             [place]  Attending:        Renella Cunas MD       Address:          [facility]                                                             [address]  [place], [place]  Referred By:      Sallyanne Havers               Location:         [facility] MD  Ref. Address:     [place], [place] ---------------------------------------------------------------------- Orders   #  Description                                 Code   1  [place] MFM OB DETAIL +[address]                     76811.01   2  [place] MFM OB TRANSVAGINAL                       78295.6  ----------------------------------------------------------------------   #  Ordered By               Order #        Accession #    Episode #   1  [name]           [id number]      [id number]     [id number]   2  [name]           [id number]      [id number]     [id number]  ---------------------------------------------------------------------- Indications   [redacted] weeks gestation of pregnancy                Z3A.19   Encounter for antenatal screening for          Z36.3   malformations   Advanced maternal age multigravida 68+         O57.522   (1), second trimester; quad screen + for T21   Poor obstetric history: Previous IUFD          O78.299   (stillbirth) @ 28wks   Poor obstetric history: Previous preterm       O09.219   delivery, antepartum   Poor obstetric history: Previous               O09.299   preeclampsia / eclampsia/gestational HTN   Abnormal biochemical screen (quad);  O28.9   elevated MSAFP (7.9 MoM) and inhibin (4.6   MoM)   Previous cesarean delivery, antepartum (X 2)   O34.219   Maternal sickle cell anemia, second trimester  O99.012, D57.1   Vaginal bleeding in pregnancy, second          O46.92   trimester  ---------------------------------------------------------------------- OB History  Gravidity:    8         Term:   1        Prem:   3        SAB:   3  Living:       2 ---------------------------------------------------------------------- Fetal Evaluation  Num Of Fetuses:     1  Fetal Heart         157  Rate(bpm):  Cardiac Activity:   Observed  Presentation:       Variable  P. Cord Insertion:  Visualized, central  Amniotic Fluid  AFI FV:      Subjectively within normal limits                              Largest Pocket(cm)                              5.35 ---------------------------------------------------------------------- Biometry  BPD:      46.1  mm     G. Age:  20w 0d         86  %    CI:        71.22   %    70 - 86                                                           FL/HC:      17.9   %    16.1 - 18.3  HC:       174   mm     G. Age:  19w 6d         83  %    HC/AC:      1.12        1.09 - 1.39  AC:      154.7  mm     G. Age:  20w 5d         91  %    FL/BPD:     67.5   %  FL:       31.1  mm     G. Age:  19w 4d         66  %    FL/AC:      20.1   %    20 - 24  HUM:      30.1  mm     G. Age:  20w 0d         74  %  CER:      19.1  mm     G. Age:  18w 4d         37  %  NFT:         4  mm  CM:          5  mm  Est. FW:     336  gm  0 lb 12 oz      62  % ---------------------------------------------------------------------- Gestational Age  LMP:           19w 0d        Date:  10/10/16                 EDD:   07/17/17  U/S Today:     20w 0d                                        EDD:   07/10/17  Best:          19w 0d     Det. By:  LMP  (10/10/16)          EDD:   07/17/17 ---------------------------------------------------------------------- Anatomy  Cranium:               Appears normal         Aortic Arch:            Appears normal  Cavum:                 Appears normal         Ductal Arch:            Appears normal  Ventricles:            Appears normal         Diaphragm:              Appears normal  Choroid Plexus:        Appears normal         Stomach:                Appears normal, left                                                                        sided  Cerebellum:            Appears normal         Abdomen:                Appears normal  Posterior Fossa:       Appears normal         Abdominal Wall:         Appears nml (cord                                                                        insert, abd wall)  Nuchal Fold:           Appears normal         Cord Vessels:           Appears normal (3  vessel cord)  Face:                  Appears normal         Kidneys:                Appear normal                         (orbits and profile)  Lips:                  Appears normal          Bladder:                Appears normal  Thoracic:              Appears normal         Spine:                  Appears normal  Heart:                 Appears normal         Upper Extremities:      Appears normal                         (4CH, axis, and                         situs)  RVOT:                  Appears normal         Lower Extremities:      Appears normal  LVOT:                  Appears normal  Other:  Fetus appears to be a female. Heels and 5th digit visualized.          Technically difficult due to fetal position. ---------------------------------------------------------------------- Cervix Uterus Adnexa  Cervix  Length:           3.44  cm.  Normal appearance by transabdominal scan.  Uterus  No abnormality visualized.  Left Ovary  Within normal limits.  Right Ovary  Not visualized.  Adnexa:       No abnormality visualized. No adnexal mass                visualized. ---------------------------------------------------------------------- Impression  SIUP at 19+0 weeks  Normal detailed fetal anatomy; no structural fetal  abnormalities were identified to explain the elevated MSAFP  (specifically, the spine, posterior fossa and abdominal wall  were seen and appeared normal)  Markers of aneuploidy: none  Normal amniotic fluid volume  Measurements consistent with LMP dating  [facility] again visualized along the right lateral uterine wall  EV views of cervix: inferior edge of placenta covers the  internal os along with some small areas of hemorrhage  Placenta: fundal, left lateral; previa  Previa precautions given. After meeting with Santiago Glad, Ms.  Kon[place] decided to have cell free DNA screening. ---------------------------------------------------------------------- Recommendations  Follow-up ultrasound for growth and to assess placental  location in 4 weeks ----------------------------------------------------------------------                 Renella Cunas, MD Electronically Signed Final Report   02/20/2017 07:36 pm  ----------------------------------------------------------------------   Scheduled Meds: . aspirin EC  81 mg Oral Daily  . cholecalciferol  4,000 Units Oral Daily  .  clobetasol cream  1 application Topical BID  . enoxaparin (LOVENOX) injection  40 mg Subcutaneous Q24H  . folic acid  5 mg Oral Daily  . HYDROmorphone   Intravenous Q4H  . [START ON 03/07/2017] magnesium oxide  400 mg Oral Daily  . oxyCODONE  10 mg Oral Q4H  . potassium chloride  40 mEq Oral BID   Followed by  . [START ON 03/07/2017] potassium chloride  20 mEq Oral Daily  . prenatal multivitamin  1 tablet Oral Q1200  . senna-docusate  1 tablet Oral BID   Continuous Infusions: . sodium chloride    . dextrose 5 % and 0.45% NaCl 20 mL/hr at 03/06/17 1037  . diphenhydrAMINE (BENADRYL) IVPB(SICKLE CELL ONLY)    . magnesium sulfate 1 - 4 g bolus IVPB      Active Problems:   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HCC)    In excess of 30 minutes spent during this visit. Greater than 50% involved face to face contact with the patient for assessment, counseling and coordination of care.

## 2017-03-06 NOTE — Progress Notes (Signed)
Nurse reinforced the need for the patient to ask nurse for PRN meds as discussed this morning. Patient states that she did not hear the nurse explain PRN dosing. Nurse again has reinforced the need for patient to use the PCA and call for additional PRN doses that are scheduled every four hours PRN. Patient verbalizes understanding.

## 2017-03-06 NOTE — Progress Notes (Signed)
Notified OB rapid response nurse Limmie Patricia RN  and made aware of new orders for fetal heart tones. Erin to notify MD and will return call to update nurse on the plan for monitoring.

## 2017-03-07 LAB — BASIC METABOLIC PANEL
Anion gap: 8 (ref 5–15)
BUN: <5 mg/dL — ABNORMAL LOW (ref 6–20)
CALCIUM: 9.5 mg/dL (ref 8.9–10.3)
CO2: 25 mmol/L (ref 22–32)
Chloride: 104 mmol/L (ref 101–111)
GLUCOSE: 99 mg/dL (ref 65–99)
Potassium: 3.1 mmol/L — ABNORMAL LOW (ref 3.5–5.1)
Sodium: 137 mmol/L (ref 135–145)

## 2017-03-07 LAB — MAGNESIUM: Magnesium: 1.5 mg/dL — ABNORMAL LOW (ref 1.7–2.4)

## 2017-03-07 MED ORDER — HYDROMORPHONE HCL 1 MG/ML IJ SOLN
1.0000 mg | INTRAMUSCULAR | Status: AC | PRN
  Administered 2017-03-07 – 2017-03-08 (×2): 1 mg via INTRAVENOUS
  Filled 2017-03-07 (×2): qty 1

## 2017-03-07 NOTE — Progress Notes (Signed)
Doppler FHR 154, pt denies any bright red bleeding, small amount of old blood, brown in color, noted on pts pad. Pt reports some fetal movement. Denies any other OB complaints.

## 2017-03-08 MED ORDER — HYDROMORPHONE BOLUS VIA INFUSION: 2.0000 mg | Freq: Once | INTRAVENOUS | Status: DC

## 2017-03-08 MED ORDER — SODIUM CHLORIDE 0.9 % IV BOLUS (SEPSIS)
500.0000 mL | Freq: Once | INTRAVENOUS | Status: AC
  Administered 2017-03-08: 500 mL via INTRAVENOUS

## 2017-03-08 MED ORDER — HYDROMORPHONE HCL 2 MG/ML IJ SOLN
2.0000 mg | Freq: Once | INTRAMUSCULAR | Status: AC
  Administered 2017-03-08: 2 mg via INTRAVENOUS

## 2017-03-08 NOTE — Progress Notes (Signed)
FHT 156 BPM by doppler. Pt denies cramping or uc's. Small amt of dk blood noted on pt's pad. Pt instructed to report bright red bleeding. Pt's nurse instructed to call [facility] for bright red bleeding. Dr. Elly Modena notified.

## 2017-03-08 NOTE — Progress Notes (Signed)
Patient ID: [name], female   DOB: January 04, 1971, 46 y.o.   MRN: [id number] Subjective:  Patient still reporting significant amount of pain. She is not ambulating yet, she complain on right leg pain from right hip joint to right knee, no swelling, no fever, no redness. She denies cough, no chest pain. Not eating well but no vomiting.  Objective:  Vital signs in last 24 hours:  Vitals:   03/08/17 1001 03/08/17 1131 03/08/17 1300 03/08/17 1536  BP: 103/80  115/80   Pulse: 100  96   Resp: 18 20 18 20   Temp: 98 F (36.7 C)  98.3 F (36.8 C)   TempSrc: Oral  Oral   SpO2: 98% 97% 98% 98%  Weight:      Height:       Intake/Output from previous day:   Intake/Output Summary (Last 24 hours) at 03/08/17 1714 Last data filed at 03/08/17 1000  Gross per 24 hour  Intake             1080 ml  Output                0 ml  Net             1080 ml   Physical Exam: General: Alert, awake, oriented x3, in no acute distress.  HEENT: [place]/AT [place], EOMI Neck: Trachea midline,  no masses, no thyromegal,y no JVD, no carotid bruit OROPHARYNX:  Moist, No exudate/ erythema/lesions.  Heart: Regular rate and rhythm, without murmurs, rubs, gallops, PMI non-displaced, no heaves or thrills on palpation.  Lungs: Clear to auscultation, no wheezing or rhonchi noted. No increased vocal fremitus resonant to percussion  Abdomen: Soft, nontender, nondistended, positive bowel sounds, no masses no hepatosplenomegaly noted..  Neuro: No focal neurological deficits noted cranial nerves II through XII grossly intact. DTRs 2+ bilaterally upper and lower extremities. Strength 5 out of 5 in bilateral upper and lower extremities. Musculoskeletal: Bilateral lower limb ulcers, dressed, no active infection noted, no spinal tenderness noted. Psychiatric: Patient alert and oriented x3, good insight and cognition, good recent to remote recall. Lymph node survey: No cervical axillary or inguinal lymphadenopathy noted.  Lab  Results:  Basic Metabolic Panel:    Component Value Date/Time   NA 137 03/07/2017 1215   NA 135 12/16/2016 1542   NA 139 01/20/2014 0758   K 3.1 (L) 03/07/2017 1215   K 3.9 01/20/2014 0758   CL 104 03/07/2017 1215   CO2 25 03/07/2017 1215   CO2 23 01/20/2014 0758   BUN <5 (L) 03/07/2017 1215   BUN 6 12/16/2016 1542   BUN 6.0 (L) 01/20/2014 0758   CREATININE <0.30 (L) 03/07/2017 1215   CREATININE 0.30 (L) 12/20/2016 0925   CREATININE 0.5 (L) 01/20/2014 0758   GLUCOSE 99 03/07/2017 1215   GLUCOSE 72 01/20/2014 0758   CALCIUM 9.5 03/07/2017 1215   CALCIUM 9.0 01/20/2014 0758   CBC:    Component Value Date/Time   WBC 20.8 (H) 03/06/2017 0422   HGB 7.9 (L) 03/06/2017 0422   HGB 7.3 (L) 12/16/2016 1542   HGB 8.7 (L) 01/20/2014 0757   HCT 22.4 (L) 03/06/2017 0422   HCT 22.8 (L) 12/20/2016 0924   HCT 25.2 (L) 01/20/2014 0757   PLT 383 03/06/2017 0422   PLT 553 (H) 12/16/2016 1542   MCV 90.0 03/06/2017 0422   MCV 86.7 12/20/2016 0924   MCV 84.3 01/20/2014 0757   NEUTROABS 17.1 (H) 03/06/2017 0422   NEUTROABS 9.2 (H) 12/16/2016 1542  NEUTROABS 8.7 (H) 01/20/2014 0757   LYMPHSABS 0.8 03/06/2017 0422   LYMPHSABS 2.5 12/16/2016 1542   LYMPHSABS 2.7 01/20/2014 0757   MONOABS 2.9 (H) 03/06/2017 0422   MONOABS 1.9 (H) 01/20/2014 0757   EOSABS 0.0 03/06/2017 0422   EOSABS 0.6 (H) 12/16/2016 1542   BASOSABS 0.0 03/06/2017 0422   BASOSABS 0.1 12/16/2016 1542   BASOSABS 0.2 (H) 01/20/2014 0757    No results found for this or any previous visit (from the past 240 hour(s)).  Studies/Results: No results found.  Medications: Scheduled Meds: . aspirin EC  81 mg Oral Daily  . cholecalciferol  4,000 Units Oral Daily  . clobetasol cream  1 application Topical BID  . enoxaparin (LOVENOX) injection  40 mg Subcutaneous Q24H  . folic acid  5 mg Oral Daily  . HYDROmorphone   Intravenous Q4H  . magnesium oxide  400 mg Oral Daily  . oxyCODONE  10 mg Oral Q4H  . potassium chloride   20 mEq Oral Daily  . prenatal multivitamin  1 tablet Oral Q1200  . senna-docusate  1 tablet Oral BID   Continuous Infusions: . dextrose 5 % and 0.45% NaCl 20 mL/hr at 03/06/17 1037  . diphenhydrAMINE (BENADRYL) IVPB(SICKLE CELL ONLY)    . sodium chloride 500 mL (03/08/17 1709)   PRN Meds:.acetaminophen, diphenhydrAMINE **OR** diphenhydrAMINE (BENADRYL) IVPB(SICKLE CELL ONLY), naloxone **AND** sodium chloride flush, ondansetron (ZOFRAN) IV, polyethylene glycol  Consultants:  OB - Dr. 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 Hb SS with Crisis: Still in significant pain, Give bolus IVF 500 cc once, Continue PCA at current dose, give one dose of clinician assistance. She is unable to receive Toradol due to pregnancy, continue scheduled Tylenol 2. Hypokalemia: Replace IV and orally  3. Anemia of Chronic Disease: Hb today is 7.0 g/dL 2 day post transfusion of 1 unit RBC's. No indication for transfusion at this time 4. 2nd Trimester Pregnancy with vaginal bleeding: Bleeding is reduced. Fetal tones is reassuring. 5. Chronic Leg Ulcer: Continue Santyl.  Code Status: Full Code Family Communication: N/A Disposition Plan: Not yet ready for discharge  [name]  If 7PM-7AM, please contact night-coverage.  03/08/2017, 5:14 PM  LOS: 6 days

## 2017-03-09 LAB — CBC WITH DIFFERENTIAL/PLATELET
BASOS ABS: 0 10*3/uL (ref 0.0–0.1)
Basophils Relative: 0 %
Eosinophils Absolute: 0.2 10*3/uL (ref 0.0–0.7)
Eosinophils Relative: 1 %
HEMATOCRIT: 20 % — AB (ref 36.0–46.0)
HEMOGLOBIN: 7 g/dL — AB (ref 12.0–15.0)
LYMPHS PCT: 8 %
Lymphs Abs: 1.4 10*3/uL (ref 0.7–4.0)
MCH: 30.4 pg (ref 26.0–34.0)
MCHC: 35 g/dL (ref 30.0–36.0)
MCV: 87 fL (ref 78.0–100.0)
Monocytes Absolute: 2.4 10*3/uL — ABNORMAL HIGH (ref 0.1–1.0)
Monocytes Relative: 14 %
NEUTROS ABS: 12.7 10*3/uL — AB (ref 1.7–7.7)
NEUTROS PCT: 77 %
Platelets: 405 10*3/uL — ABNORMAL HIGH (ref 150–400)
RBC: 2.3 MIL/uL — AB (ref 3.87–5.11)
RDW: 16 % — ABNORMAL HIGH (ref 11.5–15.5)
WBC: 16.7 10*3/uL — AB (ref 4.0–10.5)

## 2017-03-09 LAB — COMPREHENSIVE METABOLIC PANEL
ALBUMIN: 2.4 g/dL — AB (ref 3.5–5.0)
ALK PHOS: 104 U/L (ref 38–126)
ALT: 13 U/L — ABNORMAL LOW (ref 14–54)
ANION GAP: 11 (ref 5–15)
AST: 28 U/L (ref 15–41)
BILIRUBIN TOTAL: 6.8 mg/dL — AB (ref 0.3–1.2)
BUN: <5 mg/dL — ABNORMAL LOW (ref 6–20)
CALCIUM: 9 mg/dL (ref 8.9–10.3)
CO2: 26 mmol/L (ref 22–32)
Chloride: 99 mmol/L — ABNORMAL LOW (ref 101–111)
Creatinine, Ser: <0.3 mg/dL — ABNORMAL LOW (ref 0.44–1.00)
GLUCOSE: 108 mg/dL — AB (ref 65–99)
POTASSIUM: 3 mmol/L — AB (ref 3.5–5.1)
Sodium: 136 mmol/L (ref 135–145)
TOTAL PROTEIN: 6.7 g/dL (ref 6.5–8.1)

## 2017-03-09 MED ORDER — COLLAGENASE 250 UNIT/GM EX OINT
TOPICAL_OINTMENT | Freq: Every day | CUTANEOUS | Status: DC
  Administered 2017-03-09 – 2017-03-11 (×3): via TOPICAL
  Filled 2017-03-09: qty 90

## 2017-03-09 MED ORDER — POTASSIUM CHLORIDE CRYS ER 20 MEQ PO TBCR
40.0000 meq | EXTENDED_RELEASE_TABLET | Freq: Once | ORAL | Status: AC
  Administered 2017-03-09: 40 meq via ORAL
  Filled 2017-03-09: qty 2

## 2017-03-09 NOTE — Progress Notes (Signed)
FHR 154 BPM. Scant amt of brown d/c noted on peri pad. Pt says she has been having sm amt of dk blood on her pads. Denies uc's or leaking of amniotic fluid. Instructed to notify her nurse if she has bright red vaginal bleeding. Dr. Ihor Dow notified.

## 2017-03-09 NOTE — Progress Notes (Signed)
Patient ID: [name], female   DOB: [birth year], 46 y.o.   MRN: [id number] Subjective:  Patient reports slight improvement, denies fever, no chest pain, no amniotic fluid leakage, bleeding PV almost resolved, only dark spotting on her pad. She however still has pain on her right leg from hip down.  Objective:  Vital signs in last 24 hours:  Vitals:   03/09/17 0816 03/09/17 1020 03/09/17 1235 03/09/17 1328  BP:  127/89  110/74  Pulse:  (!) 103  100  Resp: 12 15 (!) 21 (!) 22  Temp:  98.5 F (36.9 C)  99.6 F (37.6 C)  TempSrc:  Oral  Oral  SpO2: 96% 96% 97% 97%  Weight:      Height:       Intake/Output from previous day:   Intake/Output Summary (Last 24 hours) at 03/09/17 1630 Last data filed at 03/09/17 1000  Gross per 24 hour  Intake              480 ml  Output             1400 ml  Net             -920 ml   Physical Exam: General: Alert, awake, oriented x3, in no acute distress.  HEENT: [place]/AT [place], EOMI Neck: Trachea midline,  no masses, no thyromegal,y no JVD, no carotid bruit OROPHARYNX:  Moist, No exudate/ erythema/lesions.  Heart: Regular rate and rhythm, without murmurs, rubs, gallops, PMI non-displaced, no heaves or thrills on palpation.  Lungs: Clear to auscultation, no wheezing or rhonchi noted. No increased vocal fremitus resonant to percussion  Abdomen: Soft, nontender, nondistended, positive bowel sounds, no masses no hepatosplenomegaly noted..  Neuro: No focal neurological deficits noted cranial nerves II through XII grossly intact. DTRs 2+ bilaterally upper and lower extremities. Strength 5 out of 5 in bilateral upper and lower extremities. Musculoskeletal: Bilateral lower limb ulcers, dressings clean and dry, no evidence of infection, no redness or swelling, no spinal tenderness noted. Psychiatric: Patient alert and oriented x3, good insight and cognition, good recent to remote recall. Lymph node survey: No cervical axillary or inguinal lymphadenopathy  noted.  Lab Results:  Basic Metabolic Panel:    Component Value Date/Time   NA 136 03/09/2017 0430   NA 135 12/16/2016 1542   NA 139 01/20/2014 0758   K 3.0 (L) 03/09/2017 0430   K 3.9 01/20/2014 0758   CL 99 (L) 03/09/2017 0430   CO2 26 03/09/2017 0430   CO2 23 01/20/2014 0758   BUN <5 (L) 03/09/2017 0430   BUN 6 12/16/2016 1542   BUN 6.0 (L) 01/20/2014 0758   CREATININE <0.30 (L) 03/09/2017 0430   CREATININE 0.30 (L) 12/20/2016 0925   CREATININE 0.5 (L) 01/20/2014 0758   GLUCOSE 108 (H) 03/09/2017 0430   GLUCOSE 72 01/20/2014 0758   CALCIUM 9.0 03/09/2017 0430   CALCIUM 9.0 01/20/2014 0758   CBC:    Component Value Date/Time   WBC 16.7 (H) 03/09/2017 0430   HGB 7.0 (L) 03/09/2017 0430   HGB 7.3 (L) 12/16/2016 1542   HGB 8.7 (L) 01/20/2014 0757   HCT 20.0 (L) 03/09/2017 0430   HCT 22.8 (L) 12/20/2016 0924   HCT 25.2 (L) 01/20/2014 0757   PLT 405 (H) 03/09/2017 0430   PLT 553 (H) 12/16/2016 1542   MCV 87.0 03/09/2017 0430   MCV 86.7 12/20/2016 0924   MCV 84.3 01/20/2014 0757   NEUTROABS 12.7 (H) 03/09/2017 0430   NEUTROABS 9.2 (  H) 12/16/2016 1542   NEUTROABS 8.7 (H) 01/20/2014 0757   LYMPHSABS 1.4 03/09/2017 0430   LYMPHSABS 2.5 12/16/2016 1542   LYMPHSABS 2.7 01/20/2014 0757   MONOABS 2.4 (H) 03/09/2017 0430   MONOABS 1.9 (H) 01/20/2014 0757   EOSABS 0.2 03/09/2017 0430   EOSABS 0.6 (H) 12/16/2016 1542   BASOSABS 0.0 03/09/2017 0430   BASOSABS 0.1 12/16/2016 1542   BASOSABS 0.2 (H) 01/20/2014 0757    No results found for this or any previous visit (from the past 240 hour(s)).  Studies/Results: No results found.  Medications: Scheduled Meds: . aspirin EC  81 mg Oral Daily  . cholecalciferol  4,000 Units Oral Daily  . clobetasol cream  1 application Topical BID  . enoxaparin (LOVENOX) injection  40 mg Subcutaneous Q24H  . folic acid  5 mg Oral Daily  . HYDROmorphone   Intravenous Q4H  . magnesium oxide  400 mg Oral Daily  . oxyCODONE  10 mg Oral  Q4H  . potassium chloride  20 mEq Oral Daily  . prenatal multivitamin  1 tablet Oral Q1200  . senna-docusate  1 tablet Oral BID   Continuous Infusions: . dextrose 5 % and 0.45% NaCl 20 mL/hr at 03/09/17 1450  . 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 Hb SS with Crisis: Pain is slightly better today, Continue PCA at current dose, She is unable to receive Toradol due to pregnancy, continue scheduled Tylenol 2. Hypokalemia: Replace orally  3. Anemia of Chronic Disease: Hb today is 7.0 g/dL post transfusion of 1 unit RBC's. No indication for transfusion at this time but if Hb drops below 7 tomorrow, may need another unit 4. 2nd Trimester Pregnancy with vaginal bleeding: FHR 154 BPM. Scant amt of brown d/c noted on peri pad. Pt says she has been having small amount of dark blood on her pads. Denies uc's or leaking of amniotic fluid. 5. Chronic Leg Ulcer: Continue Santyl.  Code Status: Full Code Family Communication: N/A Disposition Plan: Not yet ready for discharge  [name]  If 7PM-7AM, please contact night-coverage.  03/09/2017, 4:30 PM  LOS: 7 days

## 2017-03-10 DIAGNOSIS — Z3492 Encounter for supervision of normal pregnancy, unspecified, second trimester: Secondary | ICD-10-CM

## 2017-03-10 LAB — COMPREHENSIVE METABOLIC PANEL
ALT: 13 U/L — AB (ref 14–54)
AST: 32 U/L (ref 15–41)
Albumin: 2.2 g/dL — ABNORMAL LOW (ref 3.5–5.0)
Alkaline Phosphatase: 121 U/L (ref 38–126)
Anion gap: 8 (ref 5–15)
CHLORIDE: 101 mmol/L (ref 101–111)
CO2: 25 mmol/L (ref 22–32)
Calcium: 8.6 mg/dL — ABNORMAL LOW (ref 8.9–10.3)
Creatinine, Ser: <0.3 mg/dL — ABNORMAL LOW (ref 0.44–1.00)
Glucose, Bld: 103 mg/dL — ABNORMAL HIGH (ref 65–99)
POTASSIUM: 3.2 mmol/L — AB (ref 3.5–5.1)
SODIUM: 134 mmol/L — AB (ref 135–145)
Total Bilirubin: 4.7 mg/dL — ABNORMAL HIGH (ref 0.3–1.2)
Total Protein: 6.5 g/dL (ref 6.5–8.1)

## 2017-03-10 LAB — CBC WITH DIFFERENTIAL/PLATELET
BAND NEUTROPHILS: 0 %
BASOS ABS: 0 10*3/uL (ref 0.0–0.1)
BASOS PCT: 0 %
Blasts: 0 %
EOS ABS: 0.3 10*3/uL (ref 0.0–0.7)
EOS PCT: 2 %
HCT: 19.2 % — ABNORMAL LOW (ref 36.0–46.0)
Hemoglobin: 6.5 g/dL — CL (ref 12.0–15.0)
Lymphocytes Relative: 5 %
Lymphs Abs: 0.7 10*3/uL (ref 0.7–4.0)
MCH: 29.3 pg (ref 26.0–34.0)
MCHC: 33.9 g/dL (ref 30.0–36.0)
MCV: 86.5 fL (ref 78.0–100.0)
METAMYELOCYTES PCT: 0 %
MONO ABS: 1.3 10*3/uL — AB (ref 0.1–1.0)
MYELOCYTES: 0 %
Monocytes Relative: 9 %
NEUTROS PCT: 84 %
Neutro Abs: 11.8 10*3/uL — ABNORMAL HIGH (ref 1.7–7.7)
PLATELETS: 423 10*3/uL — AB (ref 150–400)
Promyelocytes Absolute: 0 %
RBC: 2.22 MIL/uL — AB (ref 3.87–5.11)
RDW: 16.8 % — AB (ref 11.5–15.5)
WBC: 14.1 10*3/uL — AB (ref 4.0–10.5)
nRBC: 5 /100 WBC — ABNORMAL HIGH

## 2017-03-10 LAB — MAGNESIUM: MAGNESIUM: 1.6 mg/dL — AB (ref 1.7–2.4)

## 2017-03-10 LAB — PREPARE RBC (CROSSMATCH)

## 2017-03-10 MED ORDER — SODIUM CHLORIDE 0.9 % IV SOLN
Freq: Once | INTRAVENOUS | Status: AC
  Administered 2017-03-10: 13:00:00 via INTRAVENOUS

## 2017-03-10 NOTE — Progress Notes (Signed)
FHR 160 BPM. Small amt of dk blood noted on peri pad. Pt says bleeding is the same. No bright red blood. Denies uc's or leaking of fluid. Dr. Roselie Awkward notified.

## 2017-03-10 NOTE — Progress Notes (Signed)
On call notified of hbg of 6.5

## 2017-03-10 NOTE — Progress Notes (Signed)
Patient ID: [name], female   DOB: [birth year], 46 y.o.   MRN: [id number] Subjective:  Patient slowly getting better. Hb is down to 6.5 g/dl this morning. Patient is still weak and tired. Vaginal discharge is minimal, no frank bleeding. No fever, no chest pain, no N/V.   Objective:  Vital signs in last 24 hours:  Vitals:   03/10/17 1527 03/10/17 1545 03/10/17 1557 03/10/17 1800  BP: (!) 108/54 108/71 113/64 129/78  Pulse: 87 86 83 90  Resp: 15 12 20 18   Temp: 97.7 F (36.5 C) 97.6 F (36.4 C) 98.1 F (36.7 C) 97.8 F (36.6 C)  TempSrc: Oral Oral Oral Oral  SpO2: 100% 100% 98% 99%  Weight:      Height:        Intake/Output from previous day:   Intake/Output Summary (Last 24 hours) at 03/10/17 1805 Last data filed at 03/10/17 1800  Gross per 24 hour  Intake           2312.5 ml  Output             3150 ml  Net           -837.5 ml    Physical Exam: General: Alert, awake, oriented x3, in no acute distress.  HEENT: [place]/AT [place], EOMI Neck: Trachea midline,  no masses, no thyromegal,y no JVD, no carotid bruit OROPHARYNX:  Moist, No exudate/ erythema/lesions.  Heart: Regular rate and rhythm, without murmurs, rubs, gallops, PMI non-displaced, no heaves or thrills on palpation.  Lungs: Clear to auscultation, no wheezing or rhonchi noted. No increased vocal fremitus resonant to percussion  Abdomen: Soft, nontender, nondistended, positive bowel sounds, no masses no hepatosplenomegaly noted..  Neuro: No focal neurological deficits noted cranial nerves II through XII grossly intact. DTRs 2+ bilaterally upper and lower extremities. Strength 5 out of 5 in bilateral upper and lower extremities. Musculoskeletal: Chronic bilateral leg ulcers, dressed. No evidence of infection. No warm swelling or erythema around joints, no spinal tenderness noted. Psychiatric: Patient alert and oriented x3, good insight and cognition, good recent to remote recall. Lymph node survey: No cervical  axillary or inguinal lymphadenopathy noted.  Lab Results:  Basic Metabolic Panel:    Component Value Date/Time   NA 134 (L) 03/10/2017 0429   NA 135 12/16/2016 1542   NA 139 01/20/2014 0758   K 3.2 (L) 03/10/2017 0429   K 3.9 01/20/2014 0758   CL 101 03/10/2017 0429   CO2 25 03/10/2017 0429   CO2 23 01/20/2014 0758   BUN <5 (L) 03/10/2017 0429   BUN 6 12/16/2016 1542   BUN 6.0 (L) 01/20/2014 0758   CREATININE <0.30 (L) 03/10/2017 0429   CREATININE 0.30 (L) 12/20/2016 0925   CREATININE 0.5 (L) 01/20/2014 0758   GLUCOSE 103 (H) 03/10/2017 0429   GLUCOSE 72 01/20/2014 0758   CALCIUM 8.6 (L) 03/10/2017 0429   CALCIUM 9.0 01/20/2014 0758   CBC:    Component Value Date/Time   WBC 14.1 (H) 03/10/2017 0429   HGB 6.5 (LL) 03/10/2017 0429   HGB 7.3 (L) 12/16/2016 1542   HGB 8.7 (L) 01/20/2014 0757   HCT 19.2 (L) 03/10/2017 0429   HCT 22.8 (L) 12/20/2016 0924   HCT 25.2 (L) 01/20/2014 0757   PLT 423 (H) 03/10/2017 0429   PLT 553 (H) 12/16/2016 1542   MCV 86.5 03/10/2017 0429   MCV 86.7 12/20/2016 0924   MCV 84.3 01/20/2014 0757   NEUTROABS 11.8 (H) 03/10/2017 0429   NEUTROABS 9.2 (  H) 12/16/2016 1542   NEUTROABS 8.7 (H) 01/20/2014 0757   LYMPHSABS 0.7 03/10/2017 0429   LYMPHSABS 2.5 12/16/2016 1542   LYMPHSABS 2.7 01/20/2014 0757   MONOABS 1.3 (H) 03/10/2017 0429   MONOABS 1.9 (H) 01/20/2014 0757   EOSABS 0.3 03/10/2017 0429   EOSABS 0.6 (H) 12/16/2016 1542   BASOSABS 0.0 03/10/2017 0429   BASOSABS 0.1 12/16/2016 1542   BASOSABS 0.2 (H) 01/20/2014 0757    No results found for this or any previous visit (from the past 240 hour(s)).  Studies/Results: No results found.  Medications: Scheduled Meds: . aspirin EC  81 mg Oral Daily  . cholecalciferol  4,000 Units Oral Daily  . clobetasol cream  1 application Topical BID  . collagenase   Topical Daily  . enoxaparin (LOVENOX) injection  40 mg Subcutaneous Q24H  . folic acid  5 mg Oral Daily  . HYDROmorphone    Intravenous Q4H  . magnesium oxide  400 mg Oral Daily  . oxyCODONE  10 mg Oral Q4H  . potassium chloride  20 mEq Oral Daily  . prenatal multivitamin  1 tablet Oral Q1200  . senna-docusate  1 tablet Oral BID   Continuous Infusions: . dextrose 5 % and 0.45% NaCl 20 mL/hr at 03/09/17 2244  . 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 Hb SS with Crisis:Pain is slightly better today, Continue PCA at current dose, She is unable to receive Toradol due to pregnancy, continuescheduled Tylenol 2. Hypokalemia:Replace orally  3. Anemia of Chronic Disease:Hb today is down to 6.5g/dL. Will transfuse with 2 units of PRBC on account of pregnancy, in attempt to maintain Hb above/around 8.  4. 2nd Trimester Pregnancy with vaginal bleeding:FHR 154 BPM. Scant amt of brown d/c noted on peri pad. Pt says she has been having small amount of dark blood on her pads but slowly becoming scanty to none. Denies uc's or leaking of amniotic fluid. 5. Chronic Leg Ulcer:Continue Santyl.  Code Status: Full Code Family Communication: N/A Disposition Plan: Not yet ready for discharge  Vennessa Affinito  If 7PM-7AM, please contact night-coverage.  03/10/2017, 6:05 PM  LOS: 8 days

## 2017-03-10 NOTE — Progress Notes (Signed)
2 units PRBC transfused. Pt tolerated each unit well. No s/s of distress noted. VS WNL.

## 2017-03-11 LAB — BPAM RBC
Blood Product Expiration Date: 201811162359
Blood Product Expiration Date: 201811212359
ISSUE DATE / TIME: 201810221238
ISSUE DATE / TIME: 201810221534
Unit Type and Rh: 5100
Unit Type and Rh: 5100

## 2017-03-11 LAB — TYPE AND SCREEN
ABO/RH(D): B POS
Antibody Screen: NEGATIVE
Unit division: 0
Unit division: 0

## 2017-03-11 LAB — CBC WITH DIFFERENTIAL/PLATELET
BASOS PCT: 0 %
Basophils Absolute: 0 10*3/uL (ref 0.0–0.1)
Eosinophils Absolute: 0.5 10*3/uL (ref 0.0–0.7)
Eosinophils Relative: 4 %
HEMATOCRIT: 26.4 % — AB (ref 36.0–46.0)
HEMOGLOBIN: 9 g/dL — AB (ref 12.0–15.0)
LYMPHS ABS: 1.5 10*3/uL (ref 0.7–4.0)
LYMPHS PCT: 12 %
MCH: 28.8 pg (ref 26.0–34.0)
MCHC: 34.1 g/dL (ref 30.0–36.0)
MCV: 84.3 fL (ref 78.0–100.0)
MONO ABS: 1.7 10*3/uL — AB (ref 0.1–1.0)
Monocytes Relative: 13 %
NEUTROS ABS: 9 10*3/uL — AB (ref 1.7–7.7)
Neutrophils Relative %: 71 %
Platelets: 454 10*3/uL — ABNORMAL HIGH (ref 150–400)
RBC: 3.13 MIL/uL — ABNORMAL LOW (ref 3.87–5.11)
RDW: 17.2 % — AB (ref 11.5–15.5)
WBC: 12.7 10*3/uL — ABNORMAL HIGH (ref 4.0–10.5)

## 2017-03-11 LAB — COMPREHENSIVE METABOLIC PANEL
ALBUMIN: 2.3 g/dL — AB (ref 3.5–5.0)
ALK PHOS: 197 U/L — AB (ref 38–126)
ALT: 20 U/L (ref 14–54)
ANION GAP: 10 (ref 5–15)
AST: 53 U/L — ABNORMAL HIGH (ref 15–41)
BILIRUBIN TOTAL: 5.3 mg/dL — AB (ref 0.3–1.2)
BUN: <5 mg/dL — ABNORMAL LOW (ref 6–20)
CALCIUM: 9 mg/dL (ref 8.9–10.3)
CO2: 25 mmol/L (ref 22–32)
Chloride: 100 mmol/L — ABNORMAL LOW (ref 101–111)
Creatinine, Ser: <0.3 mg/dL — ABNORMAL LOW (ref 0.44–1.00)
GLUCOSE: 93 mg/dL (ref 65–99)
POTASSIUM: 3.3 mmol/L — AB (ref 3.5–5.1)
Sodium: 135 mmol/L (ref 135–145)
TOTAL PROTEIN: 6.7 g/dL (ref 6.5–8.1)

## 2017-03-11 NOTE — Discharge Instructions (Signed)
Second Trimester of Pregnancy The second trimester is from week 13 through week 28, month 4 through 6. This is often the time in pregnancy that you feel your best. Often times, morning sickness has lessened or quit. You may have more energy, and you may get hungry more often. Your unborn baby (fetus) is growing rapidly. At the end of the sixth month, he or she is about 9 inches long and weighs about 1 pounds. You will likely feel the baby move (quickening) between 18 and 20 weeks of pregnancy. Follow these instructions at home:  Avoid all smoking, herbs, and alcohol. Avoid drugs not approved by your doctor.  Do not use any tobacco products, including cigarettes, chewing tobacco, and electronic cigarettes. If you need help quitting, ask your doctor. You may get counseling or other support to help you quit.  Only take medicine as told by your doctor. Some medicines are safe and some are not during pregnancy.  Exercise only as told by your doctor. Stop exercising if you start having cramps.  Eat regular, healthy meals.  Wear a good support bra if your breasts are tender.  Do not use hot tubs, steam rooms, or saunas.  Wear your seat belt when driving.  Avoid raw meat, uncooked cheese, and liter boxes and soil used by cats.  Take your prenatal vitamins.  Take 1500-2000 milligrams of calcium daily starting at the 20th week of pregnancy until you deliver your baby.  Try taking medicine that helps you poop (stool softener) as needed, and if your doctor approves. Eat more fiber by eating fresh fruit, vegetables, and whole grains. Drink enough fluids to keep your pee (urine) clear or pale yellow.  Take warm water baths (sitz baths) to soothe pain or discomfort caused by hemorrhoids. Use hemorrhoid cream if your doctor approves.  If you have puffy, bulging veins (varicose veins), wear support hose. Raise (elevate) your feet for 15 minutes, 3-4 times a day. Limit salt in your diet.  Avoid heavy  lifting, wear low heals, and sit up straight.  Rest with your legs raised if you have leg cramps or low back pain.  Visit your dentist if you have not gone during your pregnancy. Use a soft toothbrush to brush your teeth. Be gentle when you floss.  You can have sex (intercourse) unless your doctor tells you not to.  Go to your doctor visits. Get help if:  You feel dizzy.  You have mild cramps or pressure in your lower belly (abdomen).  You have a nagging pain in your belly area.  You continue to feel sick to your stomach (nauseous), throw up (vomit), or have watery poop (diarrhea).  You have bad smelling fluid coming from your vagina.  You have pain with peeing (urination). Get help right away if:  You have a fever.  You are leaking fluid from your vagina.  You have spotting or bleeding from your vagina.  You have severe belly cramping or pain.  You lose or gain weight rapidly.  You have trouble catching your breath and have chest pain.  You notice sudden or extreme puffiness (swelling) of your face, hands, ankles, feet, or legs.  You have not felt the baby move in over an hour.  You have severe headaches that do not go away with medicine.  You have vision changes. This information is not intended to replace advice given to you by your health care provider. Make sure you discuss any questions you have with your health care  provider. Document Released: 07/31/2009 Document Revised: 10/12/2015 Document Reviewed: 07/07/2012 [employer] Interactive Patient Education  2017 [employer] Sickle Cell Anemia, Adult Sickle cell anemia is a condition in which red blood cells have an abnormal sickle shape. This abnormal shape shortens the cells life span, which results in a lower than normal concentration of red blood cells in the blood. The sickle shape also causes the cells to clump together and block free blood flow through the blood vessels. As a result, the tissues and organs  of the body do not receive enough oxygen. Sickle cell anemia causes organ damage and pain and increases the risk of infection. What are the causes? Sickle cell anemia is a genetic disorder. Those who receive two copies of the gene have the condition, and those who receive one copy have the trait. What increases the risk? The sickle cell gene is most common in people whose families originated in [place]. Other areas of the globe where sickle cell trait occurs include the [place], [place]. What are the signs or symptoms?  Pain, especially in the extremities, back, chest, or abdomen (common). The pain may start suddenly or may develop following an illness, especially if there is dehydration. Pain can also occur due to overexertion or exposure to extreme temperature changes.  Frequent severe bacterial infections, especially certain types of pneumonia and meningitis.  Pain and swelling in the hands and feet.  Decreased activity.  Loss of appetite.  Change in behavior.  Headaches.  Seizures.  Shortness of breath or difficulty breathing.  Vision changes.  Skin ulcers. Those with the trait may not have symptoms or they may have mild symptoms. How is this diagnosed? Sickle cell anemia is diagnosed with blood tests that demonstrate the genetic trait. It is often diagnosed during the newborn period, due to mandatory testing nationwide. A variety of blood tests, X-rays, CT scans, MRI scans, ultrasounds, and lung function tests may also be done to monitor the condition. How is this treated? Sickle cell anemia may be treated with:  Medicines. You may be given pain medicines, antibiotic medicines (to treat and prevent infections) or medicines to increase the production of certain types of hemoglobin.  Fluids.  Oxygen.  Blood transfusions.  Follow these instructions at home:  Drink enough fluid to keep your urine clear or pale  yellow. Increase your fluid intake in hot weather and during exercise.  Do not smoke. Smoking lowers oxygen levels in the blood.  Only take over-the-counter or prescription medicines for pain, fever, or discomfort as directed by your health care provider.  Take antibiotics as directed by your health care provider. Make sure you finish them it even if you start to feel better.  Take supplements as directed by your health care provider.  Consider wearing a medical alert bracelet. This tells anyone caring for you in an emergency of your condition.  When traveling, keep your medical information, health care provider's names, and the medicines you take with you at all times.  If you develop a fever, do not take medicines to reduce the fever right away. This could cover up a problem that is developing. Notify your health care provider.  Keep all follow-up appointments with your health care provider. Sickle cell anemia requires regular medical care. Contact a health care provider if: You have a fever. Get help right away if:  You feel dizzy or faint.  You have new abdominal pain, especially on the left side near  the stomach area.  You develop a persistent, often uncomfortable and painful penile erection (priapism). If this is not treated immediately it will lead to impotence.  You have numbness your arms or legs or you have a hard time moving them.  You have a hard time with speech.  You have a fever or persistent symptoms for more than 2-3 days.  You have a fever and your symptoms suddenly get worse.  You have signs or symptoms of infection. These include: ? Chills. ? Abnormal tiredness (lethargy). ? Irritability. ? Poor eating. ? Vomiting.  You develop pain that is not helped with medicine.  You develop shortness of breath.  You have pain in your chest.  You are coughing up pus-like or bloody sputum.  You develop a stiff neck.  Your feet or hands swell or have  pain.  Your abdomen appears bloated.  You develop joint pain. This information is not intended to replace advice given to you by your health care provider. Make sure you discuss any questions you have with your health care provider. Document Released: 08/14/2005 Document Revised: 11/24/2015 Document Reviewed: 12/16/2012 [employer] Interactive Patient Education  2017 [employer].

## 2017-03-11 NOTE — Discharge Summary (Signed)
Physician Discharge Summary  [name] VOZ:[id number] DOB: 04-May-1971 DOA: 03/02/2017  PCP: [name], FNP  Admit date: 03/02/2017  Discharge date: 03/11/2017  Discharge Diagnoses:  Active Problems:   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[place])   Second trimester pregnancy  Discharge Condition: Stable  Disposition:  Follow-up Information    [name], FNP Follow up in 1 week(s).   Specialty:  Family Medicine Contact information: [address]. [place] [address] [phone]          Pt is discharged home in good condition and is to follow up with [name], FNP this week to have labs evaluated. He is instructed to increase activity slowly and balance with rest for the next few days, and use prescribed medication to complete treatment of pain  Diet: Regular  Wt Readings from Last 3 Encounters:  03/11/17 60.8 kg (134 lb)  02/21/17 58.1 kg (128 lb)  02/20/17 57.6 kg (127 lb)   History of present illness:  This is an opiate naive patient with Hb SS who is [id number] at 20 weeks who presented to the ED for sickle cell crisis. Pt reports that she had a sudden onset of pain at midnight last night which was throbbing in nature and characteristic of Sickle Cell Crisis. She denies any fevers, chills, vomiting, CP, dizziness, SOB or syncope. Currently she rates the pain as 10/10 and localized to the RLE.   In the ED she received 3 doses of Dilaudid roughly 1 hour apart but has not received any further medication for the last 3 hours. She also had a Duplex U/S of the RLE performed which was negative for DVT. I am asked to admit patient for sickle cell crisis.  Hospital Course:  Patient was admitted for sickle cell pain crisis. She had a prolonged hospital stay because of difficult-to-control pain and bleeding PV in second trimester of pregnancy. Patient t was managed appropriately with IVF, IV Dilaudid PCA, Toradol was avoided because of  pregnancy. She had a total of 3 units of PRBC transfusion at different time during this admission due to low Hb in pregnancy. Pt reported having more vaginal bleeding than she was used to. She also reports being told that she has placenta previa and that she should not lift heavy objects. BP was within normal limits throughout admission and FHR were heard all through. She slowly got better and significantly improved in functionality especially after the last 2 units of blood transfusion. She remained hemodynamically stable and was discharged home in that stable condition. She has appointment to follow up with OBGYN on 03/14/2017 and also to follow up with PCP within one week of this discharge.   Discharge Exam: Vitals:   03/11/17 0754 03/11/17 0800  BP:    Pulse:    Resp: 18   Temp:    SpO2: 97% 97%   Vitals:   03/11/17 0452 03/11/17 0753 03/11/17 0754 03/11/17 0800  BP: 137/73     Pulse: 90     Resp: 17 18 18    Temp: 98 F (36.7 C)     TempSrc: Oral     SpO2: 98% 97% 97% 97%  Weight: 60.8 kg (134 lb)     Height:       General appearance : Awake, alert, not in any distress. Speech Clear. Not toxic looking HEENT: Atraumatic and Normocephalic, pupils equally reactive to light and accomodation Neck: Supple, no JVD. No cervical lymphadenopathy.  Chest: Good air entry  bilaterally, no added sounds  CVS: S1 S2 regular, no murmurs.  Abdomen: Bowel sounds present, Non tender and not distended with no gaurding, rigidity or rebound. Extremities: B/L Lower Ext shows no edema, both legs are warm to touch Neurology: Awake alert, and oriented X 3, CN II-XII intact, Non focal Skin: No Rash  Discharge Instructions  Discharge Instructions    Diet - low sodium heart healthy    Complete by:  As directed    Increase activity slowly    Complete by:  As directed      Allergies as of 03/11/2017   No Known Allergies     Medication List    TAKE these medications   aspirin EC 81 MG tablet Take  1 tablet (81 mg total) by mouth daily. Take after 12 weeks for prevention of preeclampsia later in pregnancy   Clobetasol Prop Emollient Base 0.05 % emollient cream Commonly known as:  CLOBETASOL PROPIONATE E Apply 1 application topically 2 (two) times daily.   folic acid 1 MG tablet Commonly known as:  FOLVITE Take 5 tablets (5 mg total) by mouth daily.   Oxycodone HCl 10 MG Tabs Take 1 tablet (10 mg total) by mouth every 4 (four) hours as needed (pain).   Prenatal Vitamins 0.8 MG tablet Take 1 tablet by mouth daily.   RESTA SILVER Gel Apply 1 application topically daily. Applies to leg   Vitamin D 2000 units Caps Take 2 capsules (4,000 Units total) by mouth daily.       The results of significant diagnostics from this hospitalization (including imaging, microbiology, ancillary and laboratory) are listed below for reference.    Significant Diagnostic Studies: [place] Mfm Ob Transvaginal  Result Date: 02/20/2017 ----------------------------------------------------------------------  OBSTETRICS REPORT                      (Signed Final 02/20/2017 07:36 pm) ---------------------------------------------------------------------- Patient Info  ID #:       [id number]                          D.O.B.:  Aug 14, 2070 (46 yrs)  Name:       [name]                  Visit Date: 02/20/2017 10:40 am ---------------------------------------------------------------------- Performed By  Performed By:     Elisabeth [name]        Secondary Phy.:   [facility]                                                             [facility]  [facility]care  Attending:        Renella Cunas MD       Address:          [facility]                                                             [place], [place]  Referred By:      Sallyanne Havers               Location:         [facility] MD  Ref. Address:     [place], [place] ---------------------------------------------------------------------- Orders   #  Description                                 Code   1  [place] MFM OB DETAIL +14 WK                     [id number]   2  [place] MFM OB TRANSVAGINAL                      76160.7  ----------------------------------------------------------------------   #  Ordered By               Order #  Accession #    Episode #   1  [name]           [id number]      [id number]     [id number]   2  [name]           [id number]      [id number]     [id number]  ---------------------------------------------------------------------- Indications   [redacted] weeks gestation of pregnancy                Z3A.19   Encounter for antenatal screening for          Z36.3   malformations   Advanced maternal age multigravida 33+         O66.522   (48), second trimester; quad screen + for T21   Poor obstetric history: Previous IUFD          O60.299   (stillbirth) @ 28wks   Poor obstetric history: Previous preterm       O09.219   delivery, antepartum   Poor obstetric history: Previous               O09.299   preeclampsia / eclampsia/gestational HTN   Abnormal biochemical screen (quad);            O28.9   elevated MSAFP (7.9 MoM) and inhibin (4.6   MoM)   Previous cesarean delivery, antepartum (X 2)   O34.219   Maternal sickle cell anemia, second trimester  O99.012, D57.1   Vaginal bleeding in pregnancy, second          O46.92   trimester  ---------------------------------------------------------------------- OB History  Gravidity:    8          Term:   1        Prem:   3        SAB:   3  Living:       2 ---------------------------------------------------------------------- Fetal Evaluation  Num Of Fetuses:     1  Fetal Heart         157  Rate(bpm):  Cardiac Activity:   Observed  Presentation:       Variable  P. Cord Insertion:  Visualized, central  Amniotic Fluid  AFI FV:      Subjectively within normal limits                              Largest Pocket(cm)                              5.35 ---------------------------------------------------------------------- Biometry  BPD:      46.1  mm     G. Age:  20w 0d         86  %    CI:        71.22   %    70 - 86                                                          FL/HC:      17.9   %    16.1 - 18.3  HC:       174   mm     G. Age:  19w 6d  83  %    HC/AC:      1.12        1.09 - 1.39  AC:      154.7  mm     G. Age:  20w 5d         91  %    FL/BPD:     67.5   %  FL:       31.1  mm     G. Age:  19w 4d         66  %    FL/AC:      20.1   %    20 - 24  HUM:      30.1  mm     G. Age:  20w 0d         74  %  CER:      19.1  mm     G. Age:  18w 4d         37  %  NFT:         4  mm  CM:          5  mm  Est. FW:     336  gm    0 lb 12 oz      62  % ---------------------------------------------------------------------- Gestational Age  LMP:           19w 0d        Date:  10/10/16                 EDD:   07/17/17  U/S Today:     20w 0d                                        EDD:   07/10/17  Best:          19w 0d     Det. By:  LMP  (10/10/16)          EDD:   07/17/17 ---------------------------------------------------------------------- Anatomy  Cranium:               Appears normal         Aortic Arch:            Appears normal  Cavum:                 Appears normal         Ductal Arch:            Appears normal  Ventricles:            Appears normal         Diaphragm:              Appears normal  Choroid Plexus:        Appears normal         Stomach:                Appears normal, left                                                                         sided  Cerebellum:            Appears normal         Abdomen:                Appears normal  Posterior Fossa:       Appears normal         Abdominal Wall:         Appears nml (cord                                                                        insert, abd wall)  Nuchal Fold:           Appears normal         Cord Vessels:           Appears normal (3                                                                        vessel cord)  Face:                  Appears normal         Kidneys:                Appear normal                         (orbits and profile)  Lips:                  Appears normal         Bladder:                Appears normal  Thoracic:              Appears normal         Spine:                  Appears normal  Heart:                 Appears normal         Upper Extremities:      Appears normal                         (4CH, axis, and                         situs)  RVOT:                  Appears normal         Lower Extremities:      Appears normal  LVOT:                  Appears normal  Other:  Fetus appears to be a female. Heels and 5th digit visualized.          Technically difficult due to fetal position. ----------------------------------------------------------------------  Cervix Uterus Adnexa  Cervix  Length:           3.44  cm.  Normal appearance by transabdominal scan.  Uterus  No abnormality visualized.  Left Ovary  Within normal limits.  Right Ovary  Not visualized.  Adnexa:       No abnormality visualized. No adnexal mass                visualized. ---------------------------------------------------------------------- Impression  SIUP at 19+0 weeks  Normal detailed fetal anatomy; no structural fetal  abnormalities were identified to explain the elevated MSAFP  (specifically, the spine, posterior fossa and abdominal wall  were seen and appeared normal)  Markers of aneuploidy: none  Normal amniotic fluid volume  Measurements consistent with  LMP dating  [facility] again visualized along the right lateral uterine wall  EV views of cervix: inferior edge of placenta covers the  internal os along with some small areas of hemorrhage  Placenta: fundal, left lateral; previa  Previa precautions given. After meeting with Santiago Glad, Ms.  Vanderbeck decided to have cell free DNA screening. ---------------------------------------------------------------------- Recommendations  Follow-up ultrasound for growth and to assess placental  location in 4 weeks ----------------------------------------------------------------------                 Renella Cunas, MD Electronically Signed Final Report   02/20/2017 07:36 pm ----------------------------------------------------------------------  [place] Mfm Ob Detail +14 Wk  Result Date: 02/20/2017 ----------------------------------------------------------------------  OBSTETRICS REPORT                      (Signed Final 02/20/2017 07:36 pm) ---------------------------------------------------------------------- Patient Info  ID #:       [id number]                          D.O.B.:  [birth year] (46 yrs)  Name:       [name]                  Visit Date: 02/20/2017 10:40 am ---------------------------------------------------------------------- Performed By  Performed By:     Elisabeth [name]        Secondary Phy.:   [place] [facility]                                                             [place]  Attending:        Renella Cunas MD       Address:  [facility]                                                             [place], [place]  Referred By:      Sallyanne Havers               Location:         [facility] MD  Ref. Address:     [place], [place] ---------------------------------------------------------------------- Orders   #  Description                                 Code   1  [place] MFM OB DETAIL +14 [place]                     81191.47   2  [place] MFM OB TRANSVAGINAL                      82956.2  ----------------------------------------------------------------------   #  Ordered By               Order #        Accession #    Episode #   1  [name]           [id number]  [id number]     [id number]   2  [name]           [id number]      [id number]     [id number]  ---------------------------------------------------------------------- Indications   [redacted] weeks gestation of pregnancy                Z3A.19   Encounter for antenatal screening for          Z36.3   malformations   Advanced maternal age multigravida 62+         O60.522   (58), second trimester; quad screen + for T21   Poor obstetric history: Previous IUFD          O80.299   (stillbirth) @ 28wks   Poor obstetric history: Previous preterm       O09.219   delivery, antepartum   Poor obstetric history: Previous               O09.299   preeclampsia / eclampsia/gestational HTN   Abnormal biochemical screen (quad);            O28.9   elevated MSAFP (7.9 MoM) and inhibin (4.6   MoM)   Previous cesarean delivery, antepartum (X 2)   O34.219   Maternal sickle cell anemia, second trimester  O99.012, D57.1   Vaginal bleeding in pregnancy, second          O46.92   trimester  ---------------------------------------------------------------------- OB History  Gravidity:    8         Term:   1        Prem:   3        SAB:   3  Living:       2 ---------------------------------------------------------------------- Fetal Evaluation  Num Of Fetuses:     1   Fetal Heart         157  Rate(bpm):  Cardiac Activity:   Observed  Presentation:       Variable  P. Cord Insertion:  Visualized, central  Amniotic Fluid  AFI FV:      Subjectively within normal limits                              Largest Pocket(cm)                              5.35 ---------------------------------------------------------------------- Biometry  BPD:      46.1  mm     G. Age:  20w 0d         86  %    CI:        71.22   %    70 - 86                                                          FL/HC:      17.9   %    16.1 - 18.3  HC:       174   mm     G. Age:  19w 6d         83  %    HC/AC:      1.12        1.09 -  1.39  AC:      154.7  mm     G. Age:  20w 5d         91  %    FL/BPD:     67.5   %  FL:       31.1  mm     G. Age:  19w 4d         66  %    FL/AC:      20.1   %    20 - 24  HUM:      30.1  mm     G. Age:  20w 0d         74  %  CER:      19.1  mm     G. Age:  18w 4d         37  %  NFT:         4  mm  CM:          5  mm  Est. FW:     336  gm    0 lb 12 oz      62  % ---------------------------------------------------------------------- Gestational Age  LMP:           19w 0d        Date:  10/10/16                 EDD:   07/17/17  U/S Today:     20w 0d                                        EDD:   07/10/17  Best:          19w 0d     Det. By:  LMP  (10/10/16)          EDD:   07/17/17 ---------------------------------------------------------------------- Anatomy  Cranium:               Appears normal         Aortic Arch:            Appears normal  Cavum:                 Appears normal         Ductal Arch:            Appears normal  Ventricles:            Appears normal         Diaphragm:              Appears normal  Choroid Plexus:        Appears normal         Stomach:                Appears normal, left                                                                        sided  Cerebellum:            Appears normal         Abdomen:  Appears normal  Posterior Fossa:       Appears  normal         Abdominal Wall:         Appears nml (cord                                                                        insert, abd wall)  Nuchal Fold:           Appears normal         Cord Vessels:           Appears normal (3                                                                        vessel cord)  Face:                  Appears normal         Kidneys:                Appear normal                         (orbits and profile)  Lips:                  Appears normal         Bladder:                Appears normal  Thoracic:              Appears normal         Spine:                  Appears normal  Heart:                 Appears normal         Upper Extremities:      Appears normal                         (4CH, axis, and                         situs)  RVOT:                  Appears normal         Lower Extremities:      Appears normal  LVOT:                  Appears normal  Other:  Fetus appears to be a female. Heels and 5th digit visualized.          Technically difficult due to fetal position. ---------------------------------------------------------------------- Cervix Uterus Adnexa  Cervix  Length:           3.44  cm.  Normal appearance by transabdominal scan.  Uterus  No abnormality visualized.  Left Ovary  Within normal limits.  Right Ovary  Not visualized.  Adnexa:       No abnormality visualized. No adnexal mass                visualized. ---------------------------------------------------------------------- Impression  SIUP at 19+0 weeks  Normal detailed fetal anatomy; no structural fetal  abnormalities were identified to explain the elevated MSAFP  (specifically, the spine, posterior fossa and abdominal wall  were seen and appeared normal)  Markers of aneuploidy: none  Normal amniotic fluid volume  Measurements consistent with LMP dating  [facility] again visualized along the right lateral uterine wall  EV views of cervix: inferior edge of placenta covers the  internal os along with some small  areas of hemorrhage  Placenta: fundal, left lateral; previa  Previa precautions given. After meeting with Santiago Glad, Ms.  [place]owski decided to have cell free DNA screening. ---------------------------------------------------------------------- Recommendations  Follow-up ultrasound for growth and to assess placental  location in 4 weeks ----------------------------------------------------------------------                 Renella Cunas, MD Electronically Signed Final Report   02/20/2017 07:36 pm ----------------------------------------------------------------------   Microbiology: No results found for this or any previous visit (from the past 240 hour(s)).   Labs: Basic Metabolic Panel:  Recent Labs Lab 03/06/17 0422 03/07/17 1215 03/09/17 0430 03/10/17 0429 03/11/17 0347  NA 135 137 136 134* 135  K 2.6* 3.1* 3.0* 3.2* 3.3*  CL 100* 104 99* 101 100*  CO2 24 25 26 25 25   GLUCOSE 109* 99 108* 103* 93  BUN <5* <5* <5* <5* <5*  CREATININE <0.30* <0.30* <0.30* <0.30* <0.30*  CALCIUM 9.2 9.5 9.0 8.6* 9.0  MG 1.4* 1.5*  --  1.6*  --    Liver Function Tests:  Recent Labs Lab 03/09/17 0430 03/10/17 0429 03/11/17 0347  AST 28 32 53*  ALT 13* 13* 20  ALKPHOS 104 121 197*  BILITOT 6.8* 4.7* 5.3*  PROT 6.7 6.5 6.7  ALBUMIN 2.4* 2.2* 2.3*   No results for input(s): LIPASE, AMYLASE in the last 168 hours. No results for input(s): AMMONIA in the last 168 hours. CBC:  Recent Labs Lab 03/06/17 0422 03/09/17 0430 03/10/17 0429 03/11/17 0347  WBC 20.8* 16.7* 14.1* 12.7*  NEUTROABS 17.1* 12.7* 11.8* 9.0*  HGB 7.9* 7.0* 6.5* 9.0*  HCT 22.4* 20.0* 19.2* 26.4*  MCV 90.0 87.0 86.5 84.3  PLT 383 405* 423* 454*   Cardiac Enzymes: No results for input(s): CKTOTAL, CKMB, CKMBINDEX, TROPONINI in the last 168 hours. BNP: Invalid input(s): POCBNP CBG: No results for input(s): GLUCAP in the last 168 hours.  Time coordinating discharge: 50 minutes  Signed:  Braeson Rupe, [facility] 03/11/2017, 9:58 AM

## 2017-03-14 ENCOUNTER — Encounter: Payer: Self-pay | Admitting: Advanced Practice Midwife

## 2017-03-14 ENCOUNTER — Ambulatory Visit (INDEPENDENT_AMBULATORY_CARE_PROVIDER_SITE_OTHER): Payer: Medicaid Other | Admitting: Advanced Practice Midwife

## 2017-03-14 VITALS — BP 117/80 | HR 86 | Wt 120.0 lb

## 2017-03-14 DIAGNOSIS — O99019 Anemia complicating pregnancy, unspecified trimester: Secondary | ICD-10-CM

## 2017-03-14 DIAGNOSIS — O0992 Supervision of high risk pregnancy, unspecified, second trimester: Secondary | ICD-10-CM

## 2017-03-14 DIAGNOSIS — O2612 Low weight gain in pregnancy, second trimester: Secondary | ICD-10-CM

## 2017-03-14 DIAGNOSIS — O09522 Supervision of elderly multigravida, second trimester: Secondary | ICD-10-CM

## 2017-03-14 DIAGNOSIS — Z8759 Personal history of other complications of pregnancy, childbirth and the puerperium: Secondary | ICD-10-CM

## 2017-03-14 DIAGNOSIS — O0990 Supervision of high risk pregnancy, unspecified, unspecified trimester: Secondary | ICD-10-CM

## 2017-03-14 DIAGNOSIS — O99012 Anemia complicating pregnancy, second trimester: Secondary | ICD-10-CM

## 2017-03-14 DIAGNOSIS — D571 Sickle-cell disease 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: Secondary | ICD-10-CM

## 2017-03-14 DIAGNOSIS — O09529 Supervision of elderly multigravida, unspecified trimester: Secondary | ICD-10-CM

## 2017-03-14 MED ORDER — ENSURE ORIGINAL PO LIQD: 1.0000 | Freq: Every day | ORAL | 6 refills | Status: DC | PRN

## 2017-03-14 NOTE — Patient Instructions (Signed)
Ask for FMLA forms from your work     Placenta Previa Placenta previa is a condition in which the placenta implants in the lower part of the uterus in pregnant women. The placenta either partially or completely covers the opening to the cervix. This is a problem because the baby must pass through the cervix during delivery. There are three types of placenta previa:  Marginal placenta previa. The placenta reaches within an inch (2.5 cm) of the cervical opening but does not cover it.  Partial placenta previa. The placenta covers part of the cervical opening.  Complete placenta previa. The placenta covers the entire cervical opening.  If the previa is marginal or partial and it is diagnosed in the first half of pregnancy, the placenta may move into a normal position as the pregnancy progresses and may no longer cover the cervix. It is important to keep all prenatal visits with your health care provider so you can be more closely monitored. What are the causes? The cause of this condition is not known. What increases the risk? This condition is more likely to develop in women who:  Are carrying more than one baby (multiples).  Have an abnormally shaped uterus.  Have scars on the lining of the uterus.  Have had surgeries involving the uterus, such as a cesarean delivery.  Have delivered a baby before.  Have a history of placenta previa.  Have smoked or used cocaine during pregnancy.  Are age 45 or older during pregnancy.  What are the signs or symptoms? The main symptom of this condition is sudden, painless vaginal bleeding during the second half of pregnancy. The amount of bleeding can be very light at first, and it usually stops on its own. Heavier bleeding episodes may also happen. Some women with placenta previa may have no bleeding at all. How is this diagnosed?  This condition is diagnosed: ? From an ultrasound. This test uses sound waves to find where the placenta is located  before you have any bleeding episodes. ? During a checkup after vaginal bleeding is noticed.  If you are diagnosed with a partial or complete previa, digital exams with fingers will generally be avoided. Your health care provider will still perform a speculum exam.  If you did not have an ultrasound during your pregnancy, placenta previa may not be diagnosed until bleeding occurs during labor. How is this treated? Treatment for this condition may include:  Decreased activity.  Bed rest at home or in the hospital.  Pelvic rest. Nothing is placed inside the vagina during pelvic rest. This means not having sex and not using tampons or douches.  A blood transfusion to replace blood that you have lost (maternal blood loss).  A cesarean delivery. This may be performed if: ? The bleeding is heavy and cannot be controlled. ? The placenta completely covers the cervix.  Medicines to stop premature labor or to help the baby's lungs to mature. This treatment may be used if you need delivery before your pregnancy is full-term.  Your treatment will be decided based on:  How much you are bleeding, or whether the bleeding has stopped.  How far along you are in your pregnancy.  The condition of your baby.  The type of placenta previa that you have.  Follow these instructions at home:  Get plenty of rest and lessen activity as told by your health care provider.  Stay on bed rest for as long as told by your health care provider.  Do not have sex, use tampons, use a douche, or place anything inside of your vagina if your health care provider recommended pelvic rest.  Take over-the-counter and prescription medicines as told by your health care provider.  Keep all follow-up visits as told by your health care provider. This is important. Get help right away if:  You have vaginal bleeding, even if in small amounts and even if you have no pain.  You have cramping or regular  contractions.  You have pain in your abdomen or your lower back.  You have a feeling of increased pressure in your pelvis.  You have increased watery or bloody mucus from the vagina. This information is not intended to replace advice given to you by your health care provider. Make sure you discuss any questions you have with your health care provider. Document Released: 05/06/2005 Document Revised: 01/24/2016 Document Reviewed: 11/18/2015 Elsevier Interactive Patient Education  [employer].

## 2017-03-14 NOTE — Progress Notes (Signed)
   PRENATAL VISIT NOTE  Subjective:  [name] is a 46 y.o. H0T8882 at [redacted]w[redacted]d being seen today for ongoing prenatal care.  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  Was admitted for bleeding and sick cell crisis 10/13-10/23.  Patient reports fatigue and poor appetite, still having light bleeding, but less than when hospitalized. Righ leg still hurting, but pain adequately controlled w/ Oxy 10 mg Q4 PRN. Has enough meds. Has [place] Dr.  Contractions: Not present. Vag. Bleeding: Scant.  Movement: Present. Denies leaking of fluid.   The following portions of the patient's history were reviewed and updated as appropriate: allergies, current medications, past family history, past medical history, past social history, past surgical history and problem list. Problem list updated.  Objective:   Vitals:   03/14/17 1148  BP: 117/80  Pulse: 86  Weight: 120 lb (54.4 kg)    Fetal Status: Fetal Heart Rate (bpm): 154   Movement: Present     General:  Alert, oriented and cooperative. Patient is in no acute distress.  Skin: Skin is warm and dry. No rash noted.   Cardiovascular: Normal heart rate noted  Respiratory: Normal respiratory effort, no problems with respiration noted  Abdomen: Soft, gravid, appropriate for gestational age.  Pain/Pressure: Present     Pelvic: Cervical exam deferred          Extremities: Normal range of motion.  Edema: None  Mental Status:  Normal mood and affect. Normal behavior. Normal judgment and thought content.   Assessment and Plan:  Pregnancy: [id number] at [redacted]w[redacted]d  1. Antepartum multigravida of advanced maternal age   90. 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 Supervision of high risk pregnancy, antepartum   4. Poor weight gain of pregnancy, second trimester  - Nutritional Supplements (ENSURE ORIGINAL) LIQD; Take 1 Bottle by mouth daily as needed.  Dispense: 1 Bottle; Refill: 6  5. Previa - F/U US 11/1 - Bleeding precautions. Will need to be evaluated if bleeding increases.   Preterm labor symptoms and general obstetric precautions including but not limited to vaginal bleeding, contractions, leaking of fluid and fetal movement were reviewed in detail with the patient. Please refer to After Visit Summary for other counseling recommendations.  Asking for work note for time that she was hospitalized and for now due to pain and being drowsy on Oxy. Notes given. Suggested discussing leave options w/ her employer.  Return in about 4 weeks (around 04/11/2017) for ROB/GTT.   Manya Silvas, CNM

## 2017-03-20 ENCOUNTER — Encounter (HOSPITAL_COMMUNITY): Payer: Self-pay

## 2017-03-20 ENCOUNTER — Ambulatory Visit (HOSPITAL_COMMUNITY)
Admission: RE | Admit: 2017-03-20 | Discharge: 2017-03-20 | Disposition: A | Discharge status: Home / Self Care | Payer: Medicaid Other | Source: Ambulatory Visit | Attending: Family Medicine | Admitting: Family Medicine

## 2017-03-20 ENCOUNTER — Inpatient Hospital Stay (HOSPITAL_COMMUNITY)
Admission: AD | Admit: 2017-03-20 | Discharge: 2017-03-20 | Discharge status: Against Medical Advice | Payer: Medicaid Other | Source: Ambulatory Visit | Attending: Obstetrics and Gynecology | Admitting: Obstetrics and Gynecology

## 2017-03-20 ENCOUNTER — Encounter (HOSPITAL_COMMUNITY): Payer: Self-pay | Admitting: *Deleted

## 2017-03-20 ENCOUNTER — Other Ambulatory Visit (HOSPITAL_COMMUNITY): Payer: Self-pay | Admitting: Maternal and Fetal Medicine

## 2017-03-20 ENCOUNTER — Inpatient Hospital Stay (HOSPITAL_COMMUNITY)
Admission: AD | Admit: 2017-03-20 | Discharge: 2017-05-07 | DRG: 783 | Disposition: A | Discharge status: Home / Self Care | Payer: Medicaid Other | Source: Ambulatory Visit | Attending: Obstetrics and Gynecology | Admitting: Obstetrics and Gynecology

## 2017-03-20 DIAGNOSIS — O42012 Preterm premature rupture of membranes, onset of labor within 24 hours of rupture, second trimester: Secondary | ICD-10-CM

## 2017-03-20 DIAGNOSIS — O09522 Supervision of elderly multigravida, second trimester: Secondary | ICD-10-CM | POA: Insufficient documentation

## 2017-03-20 DIAGNOSIS — Z3A29 29 weeks gestation of pregnancy: Secondary | ICD-10-CM | POA: Diagnosis not present

## 2017-03-20 DIAGNOSIS — Z302 Encounter for sterilization: Secondary | ICD-10-CM

## 2017-03-20 DIAGNOSIS — O26899 Other specified pregnancy related conditions, unspecified trimester: Secondary | ICD-10-CM

## 2017-03-20 DIAGNOSIS — O4422 Partial placenta previa NOS or without hemorrhage, second trimester: Secondary | ICD-10-CM | POA: Diagnosis present

## 2017-03-20 DIAGNOSIS — O42919 Preterm premature rupture of membranes, unspecified as to length of time between rupture and onset of labor, unspecified trimester: Secondary | ICD-10-CM

## 2017-03-20 DIAGNOSIS — O411220 Chorioamnionitis, second trimester, not applicable or unspecified: Secondary | ICD-10-CM | POA: Diagnosis present

## 2017-03-20 DIAGNOSIS — O289 Unspecified abnormal findings on antenatal screening of mother: Secondary | ICD-10-CM

## 2017-03-20 DIAGNOSIS — O09529 Supervision of elderly multigravida, unspecified trimester: Secondary | ICD-10-CM

## 2017-03-20 DIAGNOSIS — O321XX0 Maternal care for breech presentation, not applicable or unspecified: Secondary | ICD-10-CM | POA: Insufficient documentation

## 2017-03-20 DIAGNOSIS — B354 Tinea corporis: Secondary | ICD-10-CM | POA: Diagnosis present

## 2017-03-20 DIAGNOSIS — D571 Sickle-cell disease 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: Secondary | ICD-10-CM

## 2017-03-20 DIAGNOSIS — O99012 Anemia complicating pregnancy, second trimester: Secondary | ICD-10-CM | POA: Diagnosis not present

## 2017-03-20 DIAGNOSIS — O358XX0 Maternal care for other (suspected) fetal abnormality and damage, not applicable or unspecified: Secondary | ICD-10-CM | POA: Diagnosis not present

## 2017-03-20 DIAGNOSIS — O09212 Supervision of pregnancy with history of pre-term labor, second trimester: Secondary | ICD-10-CM | POA: Diagnosis not present

## 2017-03-20 DIAGNOSIS — G894 Chronic pain syndrome: Secondary | ICD-10-CM | POA: Diagnosis not present

## 2017-03-20 DIAGNOSIS — O280 Abnormal hematological finding on antenatal screening of mother: Secondary | ICD-10-CM

## 2017-03-20 DIAGNOSIS — Z7982 Long term (current) use of aspirin: Secondary | ICD-10-CM

## 2017-03-20 DIAGNOSIS — O42112 Preterm premature rupture of membranes, onset of labor more than 24 hours following rupture, second trimester: Principal | ICD-10-CM

## 2017-03-20 DIAGNOSIS — Z3A23 23 weeks gestation of pregnancy: Secondary | ICD-10-CM

## 2017-03-20 DIAGNOSIS — Z3A28 28 weeks gestation of pregnancy: Secondary | ICD-10-CM | POA: Diagnosis not present

## 2017-03-20 DIAGNOSIS — O09299 Supervision of pregnancy with other poor reproductive or obstetric history, unspecified trimester: Secondary | ICD-10-CM

## 2017-03-20 DIAGNOSIS — Z3A25 25 weeks gestation of pregnancy: Secondary | ICD-10-CM | POA: Diagnosis not present

## 2017-03-20 DIAGNOSIS — O418X20 Other specified disorders of amniotic fluid and membranes, second trimester, not applicable or unspecified: Secondary | ICD-10-CM

## 2017-03-20 DIAGNOSIS — Z79899 Other long term (current) drug therapy: Secondary | ICD-10-CM | POA: Insufficient documentation

## 2017-03-20 DIAGNOSIS — O34211 Maternal care for low transverse scar from previous cesarean delivery: Secondary | ICD-10-CM | POA: Diagnosis present

## 2017-03-20 DIAGNOSIS — O468X2 Other antepartum hemorrhage, second trimester: Secondary | ICD-10-CM

## 2017-03-20 DIAGNOSIS — O09523 Supervision of elderly multigravida, third trimester: Secondary | ICD-10-CM | POA: Diagnosis not present

## 2017-03-20 DIAGNOSIS — O99019 Anemia complicating pregnancy, unspecified trimester: Secondary | ICD-10-CM | POA: Diagnosis not present

## 2017-03-20 DIAGNOSIS — O26892 Other specified pregnancy related conditions, second trimester: Secondary | ICD-10-CM

## 2017-03-20 DIAGNOSIS — O34219 Maternal care for unspecified type scar from previous cesarean delivery: Secondary | ICD-10-CM

## 2017-03-20 DIAGNOSIS — O42912 Preterm premature rupture of membranes, unspecified as to length of time between rupture and onset of labor, second trimester: Secondary | ICD-10-CM

## 2017-03-20 DIAGNOSIS — Z98891 History of uterine scar from previous surgery: Secondary | ICD-10-CM

## 2017-03-20 DIAGNOSIS — IMO0001 Reserved for inherently not codable concepts without codable children: Secondary | ICD-10-CM

## 2017-03-20 DIAGNOSIS — O4402 Placenta previa specified as without hemorrhage, second trimester: Secondary | ICD-10-CM | POA: Insufficient documentation

## 2017-03-20 DIAGNOSIS — L97919 Non-pressure chronic ulcer of unspecified part of right lower leg with unspecified severity: Secondary | ICD-10-CM

## 2017-03-20 DIAGNOSIS — O4103X0 Oligohydramnios, third trimester, not applicable or unspecified: Secondary | ICD-10-CM

## 2017-03-20 DIAGNOSIS — O09219 Supervision of pregnancy with history of pre-term labor, unspecified trimester: Secondary | ICD-10-CM | POA: Insufficient documentation

## 2017-03-20 DIAGNOSIS — O9902 Anemia complicating childbirth: Secondary | ICD-10-CM | POA: Diagnosis present

## 2017-03-20 DIAGNOSIS — I868 Varicose veins of other specified sites: Secondary | ICD-10-CM

## 2017-03-20 DIAGNOSIS — O4290 Premature rupture of membranes, unspecified as to length of time between rupture and onset of labor, unspecified weeks of gestation: Secondary | ICD-10-CM

## 2017-03-20 DIAGNOSIS — O4420 Partial placenta previa NOS or without hemorrhage, unspecified trimester: Secondary | ICD-10-CM | POA: Diagnosis present

## 2017-03-20 DIAGNOSIS — O09892 Supervision of other high risk pregnancies, second trimester: Secondary | ICD-10-CM

## 2017-03-20 DIAGNOSIS — D5700 Hb-SS disease with crisis, unspecified: Secondary | ICD-10-CM | POA: Diagnosis present

## 2017-03-20 DIAGNOSIS — Z3A24 24 weeks gestation of pregnancy: Secondary | ICD-10-CM | POA: Diagnosis not present

## 2017-03-20 DIAGNOSIS — Z3A26 26 weeks gestation of pregnancy: Secondary | ICD-10-CM | POA: Diagnosis not present

## 2017-03-20 DIAGNOSIS — D573 Sickle-cell trait: Secondary | ICD-10-CM

## 2017-03-20 DIAGNOSIS — Z8759 Personal history of other complications of pregnancy, childbirth and the puerperium: Secondary | ICD-10-CM

## 2017-03-20 DIAGNOSIS — O99891 Other specified diseases and conditions complicating pregnancy: Secondary | ICD-10-CM

## 2017-03-20 LAB — MAGNESIUM: Magnesium: 1.6 mg/dL — ABNORMAL LOW (ref 1.7–2.4)

## 2017-03-20 LAB — COMPREHENSIVE METABOLIC PANEL
ALT: 22 U/L (ref 14–54)
AST: 43 U/L — ABNORMAL HIGH (ref 15–41)
Albumin: 3 g/dL — ABNORMAL LOW (ref 3.5–5.0)
Alkaline Phosphatase: 128 U/L — ABNORMAL HIGH (ref 38–126)
Anion gap: 7 (ref 5–15)
BUN: <5 mg/dL — ABNORMAL LOW (ref 6–20)
CO2: 22 mmol/L (ref 22–32)
Calcium: 8.9 mg/dL (ref 8.9–10.3)
Chloride: 106 mmol/L (ref 101–111)
Creatinine, Ser: 0.38 mg/dL — ABNORMAL LOW (ref 0.44–1.00)
GFR calc Af Amer: >60 mL/min (ref >60)
GFR calc non Af Amer: >60 mL/min (ref >60)
Glucose, Bld: 114 mg/dL — ABNORMAL HIGH (ref 65–99)
Potassium: 3.4 mmol/L — ABNORMAL LOW (ref 3.5–5.1)
Sodium: 135 mmol/L (ref 135–145)
Total Bilirubin: 3.3 mg/dL — ABNORMAL HIGH (ref 0.3–1.2)
Total Protein: 7.3 g/dL (ref 6.5–8.1)

## 2017-03-20 LAB — CBC
HEMATOCRIT: 24.5 % — AB (ref 36.0–46.0)
Hemoglobin: 8.9 g/dL — ABNORMAL LOW (ref 12.0–15.0)
MCH: 29.8 pg (ref 26.0–34.0)
MCHC: 36.3 g/dL — AB (ref 30.0–36.0)
MCV: 81.9 fL (ref 78.0–100.0)
PLATELETS: 729 10*3/uL — AB (ref 150–400)
RBC: 2.99 MIL/uL — ABNORMAL LOW (ref 3.87–5.11)
RDW: 18.5 % — AB (ref 11.5–15.5)
WBC: 14.6 10*3/uL — AB (ref 4.0–10.5)

## 2017-03-20 LAB — URINALYSIS, ROUTINE W REFLEX MICROSCOPIC
Glucose, UA: NEGATIVE mg/dL
Ketones, ur: NEGATIVE mg/dL
Nitrite: NEGATIVE
PH: 6.5 (ref 5.0–8.0)
Protein, ur: NEGATIVE mg/dL
SPECIFIC GRAVITY, URINE: 1.01 (ref 1.005–1.030)

## 2017-03-20 LAB — TYPE AND SCREEN
ABO/RH(D): B POS
Antibody Screen: NEGATIVE

## 2017-03-20 LAB — URINALYSIS, MICROSCOPIC (REFLEX)
BACTERIA UA: NONE SEEN
RBC / HPF: NONE SEEN RBC/hpf (ref 0–5)

## 2017-03-20 LAB — TSH: TSH: 0.099 u[IU]/mL — ABNORMAL LOW (ref 0.350–4.500)

## 2017-03-20 LAB — AMNISURE RUPTURE OF MEMBRANE (ROM) NOT AT ARMC: AMNISURE: NEGATIVE

## 2017-03-20 MED ORDER — DOCUSATE SODIUM 100 MG PO CAPS: 100.0000 mg | ORAL_CAPSULE | Freq: Two times a day (BID) | ORAL | Status: DC | PRN

## 2017-03-20 MED ORDER — ENOXAPARIN SODIUM 40 MG/0.4ML ~~LOC~~ SOLN
40.0000 mg | SUBCUTANEOUS | Status: DC
  Administered 2017-03-21 – 2017-05-03 (×45): 40 mg via SUBCUTANEOUS
  Filled 2017-03-20 (×45): qty 0.4

## 2017-03-20 MED ORDER — BETAMETHASONE SOD PHOS & ACET 6 (3-3) MG/ML IJ SUSP
12.0000 mg | Freq: Once | INTRAMUSCULAR | Status: AC
  Administered 2017-03-21: 12 mg via INTRAMUSCULAR
  Filled 2017-03-20: qty 2

## 2017-03-20 MED ORDER — POTASSIUM CHLORIDE CRYS ER 20 MEQ PO TBCR
20.0000 meq | EXTENDED_RELEASE_TABLET | Freq: Two times a day (BID) | ORAL | Status: AC
  Administered 2017-03-21 – 2017-03-23 (×6): 20 meq via ORAL
  Filled 2017-03-20 (×6): qty 1

## 2017-03-20 MED ORDER — AZITHROMYCIN 250 MG PO TABS
500.0000 mg | ORAL_TABLET | Freq: Every day | ORAL | Status: AC
  Administered 2017-03-20 – 2017-03-26 (×7): 500 mg via ORAL
  Filled 2017-03-20 (×7): qty 2

## 2017-03-20 MED ORDER — SODIUM CHLORIDE 0.9 % IV SOLN
2.0000 g | Freq: Four times a day (QID) | INTRAVENOUS | Status: AC
  Administered 2017-03-20 – 2017-03-22 (×8): 2 g via INTRAVENOUS
  Filled 2017-03-20 (×8): qty 2000

## 2017-03-20 MED ORDER — AMOXICILLIN 500 MG PO CAPS
500.0000 mg | ORAL_CAPSULE | Freq: Three times a day (TID) | ORAL | Status: AC
  Administered 2017-03-22 – 2017-03-27 (×15): 500 mg via ORAL
  Filled 2017-03-20 (×15): qty 1

## 2017-03-20 MED ORDER — BETAMETHASONE SOD PHOS & ACET 6 (3-3) MG/ML IJ SUSP
12.0000 mg | INTRAMUSCULAR | Status: DC
  Administered 2017-03-20: 12 mg via INTRAMUSCULAR
  Filled 2017-03-20 (×2): qty 2

## 2017-03-20 MED ORDER — ASPIRIN EC 81 MG PO TBEC
81.0000 mg | DELAYED_RELEASE_TABLET | Freq: Every day | ORAL | Status: DC
  Administered 2017-03-21 – 2017-05-07 (×48): 81 mg via ORAL
  Filled 2017-03-20 (×50): qty 1

## 2017-03-20 MED ORDER — ACETAMINOPHEN 325 MG PO TABS
650.0000 mg | ORAL_TABLET | ORAL | Status: DC | PRN
Start: 2017-03-20 — End: 2017-05-04
  Administered 2017-03-21 – 2017-04-03 (×2): 650 mg via ORAL
  Filled 2017-03-20 (×3): qty 2

## 2017-03-20 MED ORDER — OXYCODONE HCL 5 MG PO TABS
10.0000 mg | ORAL_TABLET | ORAL | Status: DC | PRN
  Administered 2017-03-21 (×2): 10 mg via ORAL
  Filled 2017-03-20 (×2): qty 2

## 2017-03-20 MED ORDER — MAGNESIUM OXIDE 400 (241.3 MG) MG PO TABS
200.0000 mg | ORAL_TABLET | Freq: Two times a day (BID) | ORAL | Status: AC
  Administered 2017-03-21 – 2017-03-22 (×4): 200 mg via ORAL
  Filled 2017-03-20 (×4): qty 0.5

## 2017-03-20 MED ORDER — CALCIUM CARBONATE ANTACID 500 MG PO CHEW
2.0000 | CHEWABLE_TABLET | ORAL | Status: DC | PRN
  Administered 2017-04-02: 400 mg via ORAL
  Filled 2017-03-20: qty 2

## 2017-03-20 MED ORDER — ZOLPIDEM TARTRATE 5 MG PO TABS
5.0000 mg | ORAL_TABLET | Freq: Every evening | ORAL | Status: DC | PRN
  Administered 2017-03-31 – 2017-04-15 (×4): 5 mg via ORAL
  Filled 2017-03-20 (×4): qty 1

## 2017-03-20 MED ORDER — PRENATAL MULTIVITAMIN CH
1.0000 | ORAL_TABLET | Freq: Every day | ORAL | Status: DC
  Administered 2017-03-21 – 2017-05-04 (×45): 1 via ORAL
  Filled 2017-03-20 (×45): qty 1

## 2017-03-20 NOTE — ED Notes (Signed)
Report called to Ginger Morris, charge RN per Dr. Danielle Dess for further evaluation.  Pt ambulated and signed into MAU.

## 2017-03-20 NOTE — MAU Note (Signed)
Pt says she has felt watery discharge for months and had some bleeding. No pain

## 2017-03-20 NOTE — MAU Note (Signed)
Pt sent from MFM to rule out rupture of membranes. Pt denies pain

## 2017-03-20 NOTE — Consult Note (Signed)
Neonatology Consult to Antenatal Patient:  I was asked by Dr. Ilda Basset to see this patient in order to provide antenatal counseling due to extreme prematurity and question of premature rupture of membranes.  Mrs. Rodden was admitted 03/20/17 at 23 0/[redacted] weeks GA. She is currently admitted for question of PROM and oligohydramnios. Watery and dark bloody discharge has been increasing for months.  Amnisure negative.  She is getting BMZ and IV Ampicillin.  She is a 46yo O1Y0737 with prenatal care at [facility] dated by a 6 week ultrasound that has been complicated by a history of pre-eclampsia, previous cesarean section x 2, advanced maternal age with normal genetic screening, subchorionic hematoma and partial placenta previa.  EFW is 737g.  I spoke with the patient in her room. We discussed the worst case of delivery in the next 1-2 days, including usual DR management, likely respiratory complications and need for support, IV access, feedings (mother desires breast feeding, which was encouraged).  I discussed the high mortality and morbidity of infants less than [redacted] weeks GA. She did not have any significant questions at this time other than to state she would request we intervene and attempt to resuscitate and support her baby if mature enough.  She understands that depending on her bay's course that difficult decisions may have to be made.  I did not offer a NICU tour to any interested family members and did not leave a March of Dimes handout on prematurity, at this time.  I would be glad to come back if she has more questions.  Thank you for asking me to see this patient.  Jerlyn Ly, MD Neonatologist  The total length of face-to-face or floor/unit time for this encounter was 45 minutes. Counseling and/or coordination of care was 25 minutes of the above.

## 2017-03-20 NOTE — MAU Note (Signed)
Pt called RN to room. Upon entering pt said she felt something liquid coming out again. I looked and saw a light brown liquid on the bed pad. Called in CNM and she took another sample to look for ferning.

## 2017-03-20 NOTE — H&P (Addendum)
Obstetrics Admission History & Physical  03/20/2017 - 6:23 PM Primary OBGYN: [facility]  Chief Complaint: Concern for PPROM  History of Present Illness  46 y.o. P7T0626 @ [redacted]w[redacted]d, with the above CC. Pregnancy complicated by: right lateral previa, AMA, Gattman disease, h/o c-section x 2, umbilical vein varix .  [name] went to her anatomy u/s today and endorsed chronic LOF (watery and blood tinged). AFI showed oligo with 68mm AFI, so pt sent to MAU for concern for PPROM.  Patient states the watery and blood discharge has been on going for for months but she thinks it has increased some recently. No decreased FM or labor pains.   No chest pain, sob, fevers.  +stable LE pain.   Review of Systems:  as noted in the History of Present Illness.  PMHx:  Past Medical History:  Diagnosis Date  . Anemia 03/30/2012   Hx of sickle cell disease  . CAP (community acquired pneumonia)    2014  . Cholelithiasis 03/30/2012  . Chronic wound of extremity 04/01/2012  . Elevated LFTs   . Leg ulcer ([place]) 10/27/2012   Chronic under care of wound clinic  . Multiple open wounds of lower extremity    chronic wounds B/LLE  . Sickle cell disease ([place])   . Sinus bradycardia by electrocardiogram 04/03/2012   PSHx:  Past Surgical History:  Procedure Laterality Date  . ALLOGRAFT APPLICATION Right 9/48/5462   Procedure: SURGICAL PREP FOR GRAFTING RIGHT LOWER EXTREMITY AND APPLICATION OF Jannifer Hick;  Surgeon: Irene Limbo, MD;  Location: [facility];  Service: Plastics;  Laterality: Right;  . APPLICATION OF A-CELL OF EXTREMITY Right 10/09/2015   Procedure: APPLICATION OF Jannifer Hick;  Surgeon: Irene Limbo, MD;  Location: [facility];  Service: Plastics;  Laterality: Right;  . BREAST SURGERY     left breast cyst aspiration  . CESAREAN SECTION    . CESAREAN SECTION N/A 01/06/2014   Procedure: CESAREAN SECTION;  Surgeon: Emily Filbert,  MD;  Location: [facility];  Service: Obstetrics;  Laterality: N/A;  . I&D EXTREMITY Right 10/09/2015   Procedure: SURGICAL PREPARATION FOR GRAFTING RIGHT ANKLE AND APPLICATION THERASKIN;  Surgeon: Irene Limbo, MD;  Location: [facility];  Service: Plastics;  Laterality: Right;  . SKIN FULL THICKNESS GRAFT Bilateral 06/17/2016   Procedure: SURGICAL PREP FOR GRAFTING, BILATERAL LOWER EXTREMITIES AND APPLICATION OF Jannifer Hick;  Surgeon: Irene Limbo, MD;  Location: [facility];  Service: Plastics;  Laterality: Bilateral;  . TONSILLECTOMY     Medications:  Prescriptions Prior to Admission  Medication Sig Dispense Refill Last Dose  . aspirin EC 81 MG tablet Take 1 tablet (81 mg total) by mouth daily. Take after 12 weeks for prevention of preeclampsia later in pregnancy 300 tablet 2 03/20/2017 at Unknown time  . Cholecalciferol (VITAMIN D) 2000 units CAPS Take 2 capsules (4,000 Units total) by mouth daily. 30 capsule 11 03/19/2017 at Unknown time  . Clobetasol Prop Emollient Base (CLOBETASOL PROPIONATE E) 0.05 % emollient cream Apply 1 application topically 2 (two) times daily. 30 g 1 03/19/2017 at Unknown time  . folic acid (FOLVITE) 1 MG tablet Take 5 tablets (5 mg total) by mouth daily. 150 tablet 11 03/20/2017 at Unknown time  . Nutritional Supplements (ENSURE ORIGINAL) LIQD Take 1 Bottle by mouth daily as needed. 1 Bottle 6 03/19/2017 at Unknown time  . Oxycodone HCl 10 MG TABS Take 1 tablet (10 mg total) by mouth every 4 (four) hours  as needed (pain). 90 tablet 0 03/20/2017 at Unknown time  . Prenatal Multivit-Min-Fe-FA (PRENATAL VITAMINS) 0.8 MG tablet Take 1 tablet by mouth daily. 30 tablet 1 03/19/2017 at Unknown time  . RESTA SILVER GEL Apply 1 application topically daily. Applies to leg   03/19/2017 at Unknown time     Allergies: has No Known Allergies. OBHx:  OB History  Gravida Para Term Preterm AB Living  8 4 1 3 3 2   SAB TAB Ectopic Multiple Live Births  3       3    #  Outcome Date GA Lbr Len/2nd Weight Sex Delivery Anes PTL Lv  8 Current           7 SAB 04/22/16          6 Preterm 01/06/14 [redacted]w[redacted]d  3 lb 13.4 oz (1.74 kg) M CS-LTranv Spinal  LIV     Birth Comments: small for dates  5 Preterm 12/14/10 [redacted]w[redacted]d  3 lb (1.361 kg) F CS-LTranv  N LIV     Birth Comments: Preeclampsia and baby had low amniotic fluid volume. Baby was breach so she had csection.   4 SAB 2006             Birth Comments: 7 wks  3 SAB 12/23/03             Birth Comments: 7 wks  2 Preterm 05/25/03 [redacted]w[redacted]d    Vag-Spont  N FD     Birth Comments: Pt had fetal demise. Baby had short limbs.   1 Term 2001 [redacted]w[redacted]d   F Vag-Spont  N DEC     Birth Comments: baby had sickle cell; baby died shortly after one year old after female circumcision procedure > bleeding issues               FHx:  Family History  Problem Relation Age of Onset  . Sickle cell anemia Sister   . Diabetes Mother   . Asthma Mother   . Sickle cell trait Mother   . Sickle cell trait Father   . Hypertension Father    Soc Hx:  Social History   Social History  . Marital status: Married    Spouse name: [name]  . Number of children: 1  . Years of education: N/A   Occupational History  . Employed in home care.    Social History Main Topics  . Smoking status: Never Smoker  . Smokeless tobacco: Never Used  . Alcohol use No  . Drug use: No  . Sexual activity: Yes    Birth control/ protection: None   Other Topics Concern  . Not on file   Social History Narrative   Lives with husband.    Objective    Current Vital Signs 24h Vital Sign Ranges  T 98.9 F (37.2 C) Temp  Avg: 98.4 F (36.9 C)  Min: 98.1 F (36.7 C)  Max: 98.9 F (37.2 C)  BP 116/67 BP  Min: 116/67  Max: 137/76  HR 97 Pulse  Avg: 88.8  Min: 81  Max: 97  RR 18 Resp  Avg: 16.7  Min: 16  Max: 18  SaO2 98 % Not Delivered SpO2  Avg: 98 %  Min: 98 %  Max: 98 %       24 Hour I/O Current Shift I/O  Time Ins Outs No intake/output data  recorded. No intake/output data recorded.   FHR 170s in MFM  General: Well nourished, well developed female in no  acute distress.  Skin:  Warm and dry.  Cardiovascular: S1, S2 normal, no murmur, rub or gallop, regular rate and rhythm Respiratory:  Clear to auscultation bilateral. Normal respiratory effort Abdomen: gravid, nttp Neuro/Psych:  Normal mood and affect.   SSE: from MAU "Genitourinary: No bleeding in the vagina. Vaginal discharge found.  Genitourinary Comments: Scant amount of yellow tinged fluid in vagina, no pooling " SVE: deferred  Labs  Negative: amnisure, fern  Pending: UCx.  U/A: small leuks, +blood  Radiology 11/1: breech, FHR 171, right lateral previa. 737gm, efw 77%, AC >97%, CL 2.9cm. 5cm [facility]  Perinatal info  B POS/ Rubella  Immune / Varicella Unknown/RPR neg/HIV negative/HepB Surf Ag negative/  Assessment & Plan   46 y.o. [id number] @ [redacted]w[redacted]d with concern for PPROM. Pt currently stable *Pregnancy: qshift FHTs, continue PN vitamin *PPROM: BMZ #2 due at noon on 11/2. Start latency abx. D/w pt concerning for PPROM, even though amnisure is negative. Can consider repeat tomorrow. Will leave pad on o/n and inspect in the AM. NICU consulted *GBS: can obtain, along with GC/CT if for repeat amnisure *Heme: no current issues. Pt states has stable to improved, vs when d/c'ed on 10/23 from her most recent [place] crisis, lower leg pain. Will put on oxycodone 10mg  q4h prn.  *AMA: s/p low risk cffdna. Pt did have elevated AFP tetra. a1c ordered *h/o c-section x2: desires repeat and pt is breech. D/w her re: BTL papers *UVV: qwk dopplers until 28wks *PPx: lovenox 40mg  qday, OOB ad lib *Analgesia: no current needs *Dispo: come up with final plan tomorrow. Pt has difficulties with childcare and doesn't want to stay past the weekend.   Durene Romans MD Attending [facility])

## 2017-03-20 NOTE — MAU Provider Note (Signed)
History     CSN: [id number]  Arrival date and time: 03/20/17 1034   First Provider Initiated Contact with Patient 03/20/17 1116      Chief Complaint  Patient presents with  . Rupture of Membranes   HPI  [name] is a 46 y.o. [id number] at [redacted]w[redacted]d who presents from MFM for rule out rupture of membranes. The ultrasound today showed oligohydramnios with the largest pocket being 0.82cm, previous ultrasound on 10/5 showed largest pocket at 5.35cm. The patient reports having watery bleeding for "3-4 months." Patient has known placenta previa.  Patient denies any change in the discharge. She denies any abdominal pain. Reports good fetal movement.   OB History    Gravida Para Term Preterm AB Living   8 4 1 3 3 2    SAB TAB Ectopic Multiple Live Births   3       3      Past Medical History:  Diagnosis Date  . Anemia 03/30/2012   Hx of sickle cell disease  . CAP (community acquired pneumonia)    2014  . Cholelithiasis y-1  . Cholelithiasis 03/30/2012  . Chronic wound of extremity 04/01/2012  . Elevated LFTs   . Leg ulcer ([place]) 10/27/2012   Chronic under care of wound clinic  . Multiple open wounds of lower extremity    chronic wounds B/LLE  . Sickle cell disease ([place])   . Sinus bradycardia by electrocardiogram 04/03/2012    Past Surgical History:  Procedure Laterality Date  . ALLOGRAFT APPLICATION Right 8/75/6433   Procedure: SURGICAL PREP FOR GRAFTING RIGHT LOWER EXTREMITY AND APPLICATION OF Jannifer Hick;  Surgeon: Irene Limbo, MD;  Location: [facility];  Service: Plastics;  Laterality: Right;  . APPLICATION OF A-CELL OF EXTREMITY Right 10/09/2015   Procedure: APPLICATION OF Jannifer Hick;  Surgeon: Irene Limbo, MD;  Location: [facility] [facility];  Service: Plastics;  Laterality: Right;  . BREAST SURGERY     left breast cyst aspiration  . CESAREAN SECTION    . CESAREAN SECTION N/A 01/06/2014   Procedure: CESAREAN SECTION;  Surgeon: Emily Filbert, MD;   Location: [facility];  Service: Obstetrics;  Laterality: N/A;  . I&D EXTREMITY Right 10/09/2015   Procedure: SURGICAL PREPARATION FOR GRAFTING RIGHT ANKLE AND APPLICATION THERASKIN;  Surgeon: Irene Limbo, MD;  Location: [facility];  Service: Plastics;  Laterality: Right;  . SKIN FULL THICKNESS GRAFT Bilateral 06/17/2016   Procedure: SURGICAL PREP FOR GRAFTING, BILATERAL LOWER EXTREMITIES AND APPLICATION OF Jannifer Hick;  Surgeon: Irene Limbo, MD;  Location: [facility];  Service: Plastics;  Laterality: Bilateral;  . TONSILLECTOMY      Family History  Problem Relation Age of Onset  . Sickle cell anemia Sister   . Diabetes Mother   . Asthma Mother   . Sickle cell trait Mother   . Sickle cell trait Father   . Hypertension Father     Social History  Substance Use Topics  . Smoking status: Never Smoker  . Smokeless tobacco: Never Used  . Alcohol use No    Allergies: No Known Allergies  Prescriptions Prior to Admission  Medication Sig Dispense Refill Last Dose  . aspirin EC 81 MG tablet Take 1 tablet (81 mg total) by mouth daily. Take after 12 weeks for prevention of preeclampsia later in pregnancy 300 tablet 2 Taking  . Cholecalciferol (VITAMIN D) 2000 units CAPS Take 2 capsules (4,000 Units total) by mouth daily. 30 capsule 11 Taking  . Clobetasol Prop Emollient Base (CLOBETASOL  PROPIONATE E) 0.05 % emollient cream Apply 1 application topically 2 (two) times daily. 30 g 1 Taking  . folic acid (FOLVITE) 1 MG tablet Take 5 tablets (5 mg total) by mouth daily. 150 tablet 11 Taking  . Nutritional Supplements (ENSURE ORIGINAL) LIQD Take 1 Bottle by mouth daily as needed. (Patient not taking: Reported on 03/20/2017) 1 Bottle 6 Not Taking  . Oxycodone HCl 10 MG TABS Take 1 tablet (10 mg total) by mouth every 4 (four) hours as needed (pain). 90 tablet 0 Taking  . Prenatal Multivit-Min-Fe-FA (PRENATAL VITAMINS) 0.8 MG tablet Take 1 tablet by mouth daily. 30 tablet 1 Taking  . RESTA  SILVER GEL Apply 1 application topically daily. Applies to leg   Taking    Review of Systems  Constitutional: Negative.  Negative for fatigue and fever.  HENT: Negative.   Respiratory: Negative.  Negative for shortness of breath.   Cardiovascular: Negative.  Negative for chest pain.  Gastrointestinal: Negative.  Negative for abdominal pain, constipation, diarrhea, nausea and vomiting.  Genitourinary: Positive for vaginal bleeding and vaginal discharge. Negative for dysuria.  Neurological: Negative.  Negative for dizziness and headaches.   Physical Exam   Blood pressure 124/69, pulse 81, temperature 98.1 F (36.7 C), resp. rate 16, last menstrual period 10/10/2016.  Physical Exam  Nursing note and vitals reviewed. Constitutional: She is oriented to person, place, and time. She appears well-developed and well-nourished. No distress.  HENT:  Head: Normocephalic.  Eyes: Pupils are equal, round, and reactive to light.  Cardiovascular: Normal rate, regular rhythm and normal heart sounds.   Respiratory: Effort normal and breath sounds normal. No respiratory distress.  GI: Soft. Bowel sounds are normal. She exhibits no distension. There is no tenderness.  Genitourinary: No bleeding in the vagina. Vaginal discharge found.  Genitourinary Comments: Scant amount of yellow tinged fluid in vagina, no pooling  Neurological: She is alert and oriented to person, place, and time.  Skin: Skin is warm and dry.  Psychiatric: She has a normal mood and affect. Her behavior is normal. Judgment and thought content normal.   Fetal Tracing:  Baseline: 145 bpm Variability: moderate Accels: 10x10 Decels: none  Toco: none  MAU Course  Procedures Results for orders placed or performed during the hospital encounter of 03/20/17 (from the past 24 hour(s))  Urinalysis, Routine w reflex microscopic     Status: Abnormal   Collection Time: 03/20/17 11:00 AM  Result Value Ref Range   Color, Urine YELLOW  YELLOW   APPearance CLEAR CLEAR   Specific Gravity, Urine 1.010 1.005 - 1.030   pH 6.5 5.0 - 8.0   Glucose, UA NEGATIVE NEGATIVE mg/dL   Hgb urine dipstick MODERATE (A) NEGATIVE   Bilirubin Urine SMALL (A) NEGATIVE   Ketones, ur NEGATIVE NEGATIVE mg/dL   Protein, ur NEGATIVE NEGATIVE mg/dL   Nitrite NEGATIVE NEGATIVE   Leukocytes, UA SMALL (A) NEGATIVE  Urinalysis, Microscopic (reflex)     Status: Abnormal   Collection Time: 03/20/17 11:00 AM  Result Value Ref Range   RBC / HPF NONE SEEN 0 - 5 RBC/hpf   WBC, UA 0-5 0 - 5 WBC/hpf   Bacteria, UA NONE SEEN NONE SEEN   Squamous Epithelial / LPF 0-5 (A) NONE SEEN  Amnisure rupture of membrane (rom)not at [facility]     Status: None   Collection Time: 03/20/17 11:28 AM  Result Value Ref Range   Amnisure ROM NEGATIVE      MDM UA Fern- negative Amnisure- negative Digital  cervical exam deferred due to placenta previa BMZ Patient called out reporting more fluid- clear fluid noted at introitus, repeat fern- negative Discussed with Dr. Roselie Awkward- based on clinical presentation and ultrasound, will admit to High Risk OB for PPROM Assessment and Plan   1. Preterm premature rupture of membranes (PPROM) with unknown onset of labor   2. [redacted] weeks gestation of pregnancy    Patient stated she had to leave to pick up daughter from school, but insists she will come back. Informed patient that she would have to sign out AMA. Patient agrees and Dr. Roselie Awkward aware.   Wende Mott CNM 03/20/2017, 12:04 PM

## 2017-03-21 LAB — CULTURE, OB URINE

## 2017-03-21 LAB — T4, FREE: FREE T4: 0.95 ng/dL (ref 0.61–1.12)

## 2017-03-21 MED ORDER — HYDROMORPHONE HCL 1 MG/ML IJ SOLN
1.0000 mg | INTRAMUSCULAR | Status: DC | PRN
  Administered 2017-03-21 – 2017-03-26 (×17): 1 mg via INTRAVENOUS
  Filled 2017-03-21 (×18): qty 1

## 2017-03-21 MED ORDER — OXYCODONE HCL 5 MG PO TABS
10.0000 mg | ORAL_TABLET | ORAL | Status: DC
  Administered 2017-03-21 – 2017-05-07 (×269): 10 mg via ORAL
  Filled 2017-03-21 (×276): qty 2

## 2017-03-21 NOTE — Consult Note (Signed)
[facility] Medical Consultation  Ambermarie Honeyman UUV:[id number] DOB: 23-Jun-1970 DOA: 03/20/2017 PCP: [name], FNP   Requesting physician: Dr. 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 Hb SS with Crisis: recommend schedule Oxycodone and IV Dilaudid 1 mg 1-2 hrs prn. 2. Anemia of Chronic Disease: Defer to High risk obstetrics. At present adequate from Sickle Cell perspective.  3. Hypokalemia: Replace Potassium orally.  I will be off for the weekend. Dr, Doreene Burke will be covering the service. He will see her on an every other day basis unless condition changes and warrants closer follow up. Thank you for this consultation.  Chief Complaint:  Pain in Right leg.  HPI: This is an opiate tolerant patient with Hb SS who was admitted to [facility] yesterday for PROM. She was on her pain medications on an as needed basis but her pain escalated to 10/10 overnight. Currently her pain is 10/10 and localized to her right leg. She is currently confined to bedrest due to PROM and has no other complaints.   Review of Systems:  Pertinent items noted in the HPI and remainder of comprehensive ROS  otherwise negative.  Past Medical History:  Diagnosis Date  . Anemia 03/30/2012   Hx of sickle cell disease  . CAP (community acquired pneumonia)    2014  . Cholelithiasis 03/30/2012  . Chronic wound of extremity 04/01/2012  . Elevated LFTs   . Leg ulcer ([place]) 10/27/2012   Chronic under care of wound clinic  . Multiple open wounds of lower extremity    chronic wounds B/LLE  . Sickle cell disease ([place])   . Sinus bradycardia by electrocardiogram 04/03/2012   Past Surgical  History:  Procedure Laterality Date  . ALLOGRAFT APPLICATION Right 4/74/2595   Procedure: SURGICAL PREP FOR GRAFTING RIGHT LOWER EXTREMITY AND APPLICATION OF Jannifer Hick;  Surgeon: Irene Limbo, MD;  Location: [facility];  Service: Plastics;  Laterality: Right;  . APPLICATION OF A-CELL OF EXTREMITY Right 10/09/2015   Procedure: APPLICATION OF Jannifer Hick;  Surgeon: Irene Limbo, MD;  Location: [facility];  Service: Plastics;  Laterality: Right;  . BREAST SURGERY     left breast cyst aspiration  . CESAREAN SECTION    . CESAREAN SECTION N/A 01/06/2014   Procedure: CESAREAN SECTION;  Surgeon: Emily Filbert, MD;  Location: [facility];  Service: Obstetrics;  Laterality: N/A;  . I&D EXTREMITY Right 10/09/2015   Procedure: SURGICAL PREPARATION FOR GRAFTING RIGHT ANKLE AND APPLICATION THERASKIN;  Surgeon: Irene Limbo, MD;  Location: [facility];  Service: Plastics;  Laterality: Right;  . SKIN FULL THICKNESS GRAFT Bilateral 06/17/2016   Procedure: SURGICAL PREP FOR GRAFTING, BILATERAL LOWER EXTREMITIES AND APPLICATION OF Jannifer Hick;  Surgeon: Irene Limbo, MD;  Location: [facility];  Service: Plastics;  Laterality: Bilateral;  . TONSILLECTOMY     Social History:  reports that she has never smoked. She has never used smokeless tobacco. She reports that she does not drink alcohol or use drugs.  No Known Allergies Family History  Problem Relation Age of Onset  . Sickle cell anemia Sister   . Diabetes Mother   . Asthma Mother   . Sickle cell trait Mother   . Sickle cell trait Father   .  Hypertension Father     Prior to Admission medications   Medication Sig Start Date End Date Taking? Authorizing Provider  aspirin EC 81 MG tablet Take 1 tablet (81 mg total) by mouth daily. Take after 12 weeks for prevention of preeclampsia later in pregnancy 01/15/17  Yes Anyanwu, Sallyanne Havers, MD  Cholecalciferol (VITAMIN D) 2000 units CAPS Take 2 capsules (4,000 Units total)  by mouth daily. 06/03/16  Yes [name], FNP  Clobetasol Prop Emollient Base (CLOBETASOL PROPIONATE E) 0.05 % emollient cream Apply 1 application topically 2 (two) times daily. 12/20/16  Yes [name], FNP  folic acid (FOLVITE) 1 MG tablet Take 5 tablets (5 mg total) by mouth daily. 01/15/17  Yes Anyanwu, Sallyanne Havers, MD  Nutritional Supplements (ENSURE ORIGINAL) LIQD Take 1 Bottle by mouth daily as needed. 03/14/17  Yes Smith, [place], CNM  Oxycodone HCl 10 MG TABS Take 1 tablet (10 mg total) by mouth every 4 (four) hours as needed (pain). 02/21/17  Yes [name], FNP  Prenatal Multivit-Min-Fe-FA (PRENATAL VITAMINS) 0.8 MG tablet Take 1 tablet by mouth daily. 11/14/16  Yes Leana Gamer, MD  RESTA SILVER GEL Apply 1 application topically daily. Applies to leg   Yes [provider]   Physical Exam: Blood pressure (!) 101/58, pulse 84, temperature 98.2 F (36.8 C), temperature source Oral, resp. rate 18, height 5\' 7"  (1.702 m), weight 55.8 kg (123 lb), last menstrual period 10/10/2016, SpO2 99 %. Vitals:   03/20/17 1724 03/20/17 2105 03/21/17 0800 03/21/17 1200  BP:  (!) 106/57 (!) 97/50 (!) 101/58  Pulse:  88 81 84  Resp:  18 16 18   Temp:  98.7 F (37.1 C) 98.6 F (37 C) 98.2 F (36.8 C)  TempSrc:  Oral Oral Oral  SpO2:  98% 99% 99%  Weight: 55.8 kg (123 lb)     Height: 5\' 7"  (1.702 m)      Physical Exam General: Alert, awake, oriented x3, in mild distress.  HEENT: [place]/AT PEERL, EOMI, anicteric Neck: Trachea midline, no masses, no thyromegal,y no JVD, no carotid bruit OROPHARYNX: Moist, No exudate/ erythema/lesions.  Heart: Regular rate and rhythm, without murmurs, rubs, gallops or S3. PMI non-displaced. Exam reveals no decreased pulses. Pulmonary/Chest: Normal effort. Breath sounds normal. No. Apnea. Clear to auscultation,no stridor,  no wheezing and no rhonchi noted. No respiratory distress and no tenderness noted. Abdomen: Gravid Neuro: Alert and  oriented to person, place and time. Normal motor skills, Displays no atrophy or tremors and exhibits normal muscle tone.  No focal neurological deficits noted cranial nerves II through XII grossly intact. No sensory deficit noted.  Strength unable to assess patient on bedrest Musculoskeletal: No warmth swelling or erythema around joints, no spinal tenderness noted. Psychiatric: Patient alert and oriented x3, good insight and cognition, good recent to remote recall. Skin: Skin is warm and dry. No bruising, no ecchymosis and no rash noted. Pt is not diaphoretic. No erythema. No pallor     Labs on Admission:  Basic Metabolic Panel:  Recent Labs Lab 03/20/17 1808  NA 135  K 3.4*  CL 106  CO2 22  GLUCOSE 114*  BUN <5*  CREATININE 0.38*  CALCIUM 8.9  MG 1.6*   Liver Function Tests:  Recent Labs Lab 03/20/17 1808  AST 43*  ALT 22  ALKPHOS 128*  BILITOT 3.3*  PROT 7.3  ALBUMIN 3.0*   No results for input(s): LIPASE, AMYLASE in the last 168 hours. No results for input(s): AMMONIA  in the last 168 hours. CBC:  Recent Labs Lab 03/20/17 1808  WBC 14.6*  HGB 8.9*  HCT 24.5*  MCV 81.9  PLT 729*   Cardiac Enzymes: No results for input(s): CKTOTAL, CKMB, CKMBINDEX, TROPONINI in the last 168 hours. BNP: Invalid input(s): POCBNP CBG: No results for input(s): GLUCAP in the last 168 hours.  Radiological Exams on Admission: [place] Mfm Ob Follow Up  Result Date: 03/20/2017 ----------------------------------------------------------------------  OBSTETRICS REPORT                      (Signed Final 03/20/2017 10:37 am) ---------------------------------------------------------------------- Patient Info  ID #:       [id number]                          D.O.B.:  2070-12-08 (46 yrs)  Name:       [name]                  Visit Date: 03/20/2017 09:25 am ---------------------------------------------------------------------- Performed By  Performed By:     Hubert Azure          Secondary  Phy.:   [place] for                                                             [place]  Attending:        Abram Sander MD         Address:          [facility]                                                             [address]  [place], [place]  Referred By:      Sallyanne Havers               Location:         [facility] MD  Ref. Address:     [address]                    [place], [place] ---------------------------------------------------------------------- Orders   #  Description                                 Code   1  [place] MFM OB FOLLOW UP                         28786.76  ----------------------------------------------------------------------   #  Ordered By               Order #        Accession #    Episode #   1  Renella Cunas            [id number]      [id number]     [id number]  ---------------------------------------------------------------------- Indications   [redacted] weeks gestation of pregnancy                Z3A.29   Advanced maternal age multigravida 61+         O61.522   (59), second trimester; quad screen + for   T21 (NML NIPS)   Poor obstetric history: Previous IUFD          O53.299   (stillbirth) @ 28wks   Poor obstetric history: Previous preterm       O09.219   delivery, antepartum   Poor obstetric history: Previous               O09.299   preeclampsia / eclampsia/gestational HTN   Abnormal biochemical screen (quad);            O28.9   elevated MSAFP (7.9 MoM) and inhibin (4.6   MoM)    Previous cesarean delivery, antepartum (X 2)   O34.219   Maternal sickle cell anemia, second trimester  O99.012, D57.1  ---------------------------------------------------------------------- OB History  Gravidity:    8         Term:   1        Prem:  3        SAB:   3  Living:       2 ---------------------------------------------------------------------- Fetal Evaluation  Num Of Fetuses:     1  Fetal Heart         171  Rate(bpm):  Cardiac Activity:   Observed  Presentation:       Breech  Placenta:           Right lateral, previa  P. Cord Insertion:  Visualized, central  Amniotic Fluid  AFI FV:      Oligohydramnios                              Largest Pocket(cm)                              0.82  Comment:    "[place]" still noted (4.9x2.9x4.4) ---------------------------------------------------------------------- Biometry  BPD:      60.9  mm     G. Age:  24w 5d         95  %    CI:        76.62   %    70 - 86                                                          FL/HC:      18.8   %    19.2 - 20.8  HC:      220.4  mm     G. Age:  24w 0d         77  %    HC/AC:      1.03        1.05 - 1.21  AC:      214.2  mm     G. Age:  25w 6d       > 97  %    FL/BPD:     68.1   %    71 - 87  FL:       41.5  mm     G. Age:  23w 4d         55  %    FL/AC:      19.4   %    20 - 24  Est. FW:     737  gm    1 lb 10 oz      77  % ---------------------------------------------------------------------- Gestational Age  LMP:           23w 0d        Date:  10/10/16                 EDD:   07/17/17  U/S Today:     24w 4d                                        EDD:   07/06/17  Best:          23w 0d     Det. By:  LMP  (10/10/16)          EDD:   07/17/17 ----------------------------------------------------------------------  Anatomy  Cranium:               Appears normal         Aortic Arch:            Previously seen  Cavum:                 Appears normal         Ductal Arch:            Previously seen  Ventricles:            Appears normal          Diaphragm:              Appears normal  Choroid Plexus:        Previously seen        Stomach:                Appears normal, left                                                                        sided  Cerebellum:            Previously seen        Abdomen:                Umbilical vein                                                                        varix (1cm)  Posterior Fossa:       Previously seen        Abdominal Wall:         Previously seen  Nuchal Fold:           Previously seen        Cord Vessels:           Previously seen  Face:                  Orbits and profile     Kidneys:                Appear normal                         previously seen  Lips:                  Previously seen        Bladder:                Appears normal  Thoracic:              Previously seen        Spine:                  Previously seen  Heart:                 Previously seen  Upper Extremities:      Previously seen  RVOT:                  Previously seen        Lower Extremities:      Previously seen  LVOT:                  Previously seen  Other:  Female gender previously seen.. Technically difficult due to low amniotic          fluid.Heels and 5th digit previously seen. ---------------------------------------------------------------------- Cervix Uterus Adnexa  Cervix  Length:            2.9  cm.  Appears closed, without funnelling.  Uterus  No abnormality visualized.  Left Ovary  Not visualized.  Right Ovary  Not visualized.  Adnexa:       No abnormality visualized. ---------------------------------------------------------------------- Comments  Elevated MSAFP with low-risk cfDNA and normal anatomy.  The patient reports longstanding vaginal bleeding, more  watery in character recently. ---------------------------------------------------------------------- Impression  Single living intrauterine pregnancy at 23w 0d.  Breech presentation.  Posterior, right lateral placenta previa again seen.  Subchorionic  hematoma again seen measuring  4.9x2.9x4.4cm.  New oligohydramnios with MVP 0.82cm (previously 5.35cm  on 10/04).  Normal fetal growth.  An umbilical vein varix is visualized measuring 1.0cm. No  filling defect seen on color doppler flow.  Otherwise normal interval fetal anatomy. ---------------------------------------------------------------------- Recommendations  Concern for PPROM due to significant decrease in amniotic  fluid volume, watery vaginal bleeding, and normal fetal growth  (placental insufficiency less likely).  Discussed new oligohydramios, concern for PPROM, and  new finding of umbilical vein varix with the patient.  Recommend presentation to MAU for further evaluation.  Findings discussed with Dr. Ilda Basset.  Recommend weekly ultrasound for doppler assessment of  umbilical vein varix until 28 weeks, twice weekly thereafter if  undelivered.  Recommend follow-up ultrasound examination in 4 weeks for  reassessment of fetal growth. ----------------------------------------------------------------------                   Abram Sander, MD Electronically Signed Final Report   03/20/2017 10:37 am ----------------------------------------------------------------------     Time spent: 30 minutes  [name],[name] [facility] Pager [phone]  If 5PM-8AM, please contact night-coverage [email or web address] Password [email or web address] 03/21/2017, 5:07 PM

## 2017-03-21 NOTE — Progress Notes (Addendum)
Patient ID: [name], female   DOB: Jun 19, 1970, 46 y.o.   MRN: [id number]  [name]) NOTE  [name] is a 46 y.o. [name] at [redacted]w[redacted]d by LMP, early ultrasound who is admitted for PROM, and sx of sickle cell [name].   Fetal presentation is breech. Length of Stay:  1  Days  Subjective: C/o right leg pain c/w [name] Patient reports the fetal movement as active. Patient reports uterine contraction  activity as none. Patient reports  vaginal bleeding as none. Patient describes fluid per vagina as Clear.  Vitals:  Blood pressure (!) 97/50, pulse 81, temperature 98.6 F (37 C), temperature source Oral, resp. rate 16, height 5\' 7"  (1.702 m), weight 123 lb (55.8 kg), last menstrual period 10/10/2016, SpO2 99 %. Physical Examination:  General appearance - alert, well appearing, and in no distress Heart - normal rate and regular rhythm Abdomen - soft, nontender, nondistended Fundal Height:  size equals dates Cervical Exam: Not evaluated.  Extremities: extremities right ankle skin breakdown, dressing in place Membranes:ruptured  Fetal Monitoring:     Fetal Heart Rate A  Mode Doppler filed at 03/21/2017 0805  Baseline Rate (A) 154 bpm filed at 03/21/2017 0805     Labs:  Results for orders placed or performed during the hospital encounter of 03/20/17 (from the past 24 hour(s))  Comprehensive metabolic panel   Collection Time: 03/20/17  6:08 PM  Result Value Ref Range   Sodium 135 135 - 145 mmol/L   Potassium 3.4 (L) 3.5 - 5.1 mmol/L   Chloride 106 101 - 111 mmol/L   CO2 22 22 - 32 mmol/L   Glucose, Bld 114 (H) 65 - 99 mg/dL   BUN <5 (L) 6 - 20 mg/dL   Creatinine, Ser 0.38 (L) 0.44 - 1.00 mg/dL   Calcium 8.9 8.9 - 10.3 mg/dL   Total Protein 7.3 6.5 - 8.1 g/dL   Albumin 3.0 (L) 3.5 - 5.0 g/dL   AST 43 (H) 15 - 41 U/L   ALT 22 14 - 54 U/L   Alkaline Phosphatase 128 (H) 38 - 126 U/L   Total Bilirubin 3.3 (H) 0.3 - 1.2 mg/dL   GFR calc non Af  Amer >60 >60 mL/min   GFR calc Af Amer >60 >60 mL/min   Anion gap 7 5 - 15  Magnesium   Collection Time: 03/20/17  6:08 PM  Result Value Ref Range   Magnesium 1.6 (L) 1.7 - 2.4 mg/dL  TSH   Collection Time: 03/20/17  6:08 PM  Result Value Ref Range   TSH 0.099 (L) 0.350 - 4.500 uIU/mL  CBC on admission   Collection Time: 03/20/17  6:08 PM  Result Value Ref Range   WBC 14.6 (H) 4.0 - 10.5 K/uL   RBC 2.99 (L) 3.87 - 5.11 MIL/uL   Hemoglobin 8.9 (L) 12.0 - 15.0 g/dL   HCT 24.5 (L) 36.0 - 46.0 %   MCV 81.9 78.0 - 100.0 fL   MCH 29.8 26.0 - 34.0 pg   MCHC 36.3 (H) 30.0 - 36.0 g/dL   RDW 18.5 (H) 11.5 - 15.5 %   Platelets 729 (H) 150 - 400 K/uL  T4, free   Collection Time: 03/20/17  6:08 PM  Result Value Ref Range   Free T4 0.95 0.61 - 1.12 ng/dL  Type and screen [place]   Collection Time: 03/20/17  6:09 PM  Result Value Ref Range   ABO/RH(D) B POS    Antibody Screen  NEG    Sample Expiration 03/23/2017   Results for orders placed or performed during the hospital encounter of 03/20/17 (from the past 24 hour(s))  Urinalysis, Routine w reflex microscopic   Collection Time: 03/20/17 11:00 AM  Result Value Ref Range   Color, Urine YELLOW YELLOW   APPearance CLEAR CLEAR   Specific Gravity, Urine 1.010 1.005 - 1.030   pH 6.5 5.0 - 8.0   Glucose, UA NEGATIVE NEGATIVE mg/dL   Hgb urine dipstick MODERATE (A) NEGATIVE   Bilirubin Urine SMALL (A) NEGATIVE   Ketones, ur NEGATIVE NEGATIVE mg/dL   Protein, ur NEGATIVE NEGATIVE mg/dL   Nitrite NEGATIVE NEGATIVE   Leukocytes, UA SMALL (A) NEGATIVE  Urinalysis, Microscopic (reflex)   Collection Time: 03/20/17 11:00 AM  Result Value Ref Range   RBC / HPF NONE SEEN 0 - 5 RBC/hpf   WBC, UA 0-5 0 - 5 WBC/hpf   Bacteria, UA NONE SEEN NONE SEEN   Squamous Epithelial / LPF 0-5 (A) NONE SEEN  Amnisure rupture of membrane (rom)not at [facility]   Collection Time: 03/20/17 11:28 AM  Result Value Ref Range   Amnisure ROM  NEGATIVE     Imaging Studies:     Currently EPIC will not allow sonographic studies to automatically populate into notes.  In the meantime, copy and paste results into note or free text.  Medications:  Scheduled . [START ON 03/22/2017] amoxicillin  500 mg Oral Q8H  . aspirin EC  81 mg Oral Daily  . azithromycin  500 mg Oral Daily  . betamethasone acetate-betamethasone sodium phosphate  12 mg Intramuscular Once  . enoxaparin (LOVENOX) injection  40 mg Subcutaneous Q24H  . magnesium oxide  200 mg Oral BID  . oxyCODONE  10 mg Oral Q4H  . potassium chloride  20 mEq Oral BID WC  . prenatal multivitamin  1 tablet Oral Q1200   I have reviewed the patient's current medications.  ASSESSMENT: Patient Active Problem List   Diagnosis Date Noted  . Preterm premature rupture of membranes 03/20/2017  . History of preterm delivery, currently pregnant in second trimester 03/20/2017  . Umbilical vein abnormality affecting pregnancy (varix) 03/20/2017  . Second trimester pregnancy   . Hb-SS disease with vaso-occlusive [name] ([name]) 03/02/2017  . Partial placenta previa - left lateral previa 02/21/2017  . Abnormal MSAFP (maternal serum alpha-fetoprotein), elevated 02/18/2017  . Subchorionic hematoma in second trimester 12/23/2016  . Supervision of high risk pregnancy, antepartum 12/16/2016  . History of pre-eclampsia 12/16/2016  . 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 Advanced maternal age in multigravida 07/28/2013  . Previous cesarean delivery x 2 07/22/2013  . Hemochromatosis 11/10/2012  . Chronic pain syndrome 10/13/2012  . Leukocytosis 03/30/2012    PLAN: Hospitalization for observation for PTL, infection, fetal well-being Dr. Zigmund [name] consulted re: management of [name] sx 25 min face to face and coordination of care  [name] 03/21/2017,9:32 AM

## 2017-03-22 NOTE — Progress Notes (Signed)
MD notified about bloody water discharge.

## 2017-03-22 NOTE — Progress Notes (Signed)
Patient ID: [name], female   DOB: 1970-05-23, 46 y.o.   MRN: [id number] [place] COMPREHENSIVE PROGRESS NOTE  [name] is a 46 y.o. [id number] at [redacted]w[redacted]d  who is admitted for PROM.   Fetal presentation is breech. Length of Stay:  2  Days  Subjective: Pt reports episode of passing blood clot this afternoon. + FM Denies cramps, pressure or ut contractions   Vitals:  Blood pressure 104/62, pulse 81, temperature 98.5 F (36.9 C), temperature source Oral, resp. rate 16, height 5\' 7"  (1.702 m), weight 55.8 kg (123 lb), last menstrual period 10/10/2016, SpO2 97 %.   Physical Examination: Lungs clear Heart RRR Abd soft + BS gravid non tender Ext non tender  Fetal Monitoring:  + FHT's 120's  Labs:  No results found for this or any previous visit (from the past 24 hour(s)).  Imaging Studies:    none   Medications:  Scheduled . amoxicillin  500 mg Oral Q8H  . aspirin EC  81 mg Oral Daily  . azithromycin  500 mg Oral Daily  . enoxaparin (LOVENOX) injection  40 mg Subcutaneous Q24H  . magnesium oxide  200 mg Oral BID  . oxyCODONE  10 mg Oral Q4H  . potassium chloride  20 mEq Oral BID WC  . prenatal multivitamin  1 tablet Oral Q1200   I have reviewed the patient's current medications.  ASSESSMENT: Patient Active Problem List   Diagnosis Date Noted  . Preterm premature rupture of membranes 03/20/2017  . History of preterm delivery, currently pregnant in second trimester 03/20/2017  . Umbilical vein abnormality affecting pregnancy (varix) 03/20/2017  . Second trimester pregnancy   . 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[place]) 03/02/2017  . Partial placenta previa - left lateral previa 02/21/2017  . Abnormal MSAFP (maternal serum alpha-fetoprotein), elevated 02/18/2017  . Subchorionic hematoma in second trimester 12/23/2016  . Supervision of high risk pregnancy, antepartum 12/16/2016  . History of pre-eclampsia 12/16/2016  . Sickle cell anemia of mother  during pregnancy ([place]) 11/09/2016  . Advanced maternal age in multigravida 07/28/2013  . Previous cesarean delivery x 2 07/22/2013  . Hemochromatosis 11/10/2012  . Chronic pain syndrome 10/13/2012  . Leukocytosis 03/30/2012    PLAN: Stable S/P BMZ x 2 Continue with latency antibiotics.  Will repeat labs in AM Will have social worker see pt in regards to assistance with child care while in hospital. Continue routine antenatal care.   [name] 03/22/2017,3:44 PM

## 2017-03-22 NOTE — Progress Notes (Signed)
Md notified about bloody discharge and quarter size clot.

## 2017-03-23 DIAGNOSIS — O280 Abnormal hematological finding on antenatal screening of mother: Secondary | ICD-10-CM

## 2017-03-23 DIAGNOSIS — O42912 Preterm premature rupture of membranes, unspecified as to length of time between rupture and onset of labor, second trimester: Secondary | ICD-10-CM | POA: Diagnosis not present

## 2017-03-23 DIAGNOSIS — O42919 Preterm premature rupture of membranes, unspecified as to length of time between rupture and onset of labor, unspecified trimester: Secondary | ICD-10-CM

## 2017-03-23 LAB — COMPREHENSIVE METABOLIC PANEL
ALT: 19 U/L (ref 14–54)
AST: 44 U/L — AB (ref 15–41)
Albumin: 2.5 g/dL — ABNORMAL LOW (ref 3.5–5.0)
Alkaline Phosphatase: 107 U/L (ref 38–126)
Anion gap: 6 (ref 5–15)
CHLORIDE: 104 mmol/L (ref 101–111)
CO2: 23 mmol/L (ref 22–32)
Calcium: 9.3 mg/dL (ref 8.9–10.3)
Glucose, Bld: 85 mg/dL (ref 65–99)
POTASSIUM: 3.7 mmol/L (ref 3.5–5.1)
SODIUM: 133 mmol/L — AB (ref 135–145)
Total Bilirubin: 2.9 mg/dL — ABNORMAL HIGH (ref 0.3–1.2)
Total Protein: 6.2 g/dL — ABNORMAL LOW (ref 6.5–8.1)

## 2017-03-23 LAB — HEMOGLOBIN A1C
HEMOGLOBIN A1C: 4.4 % — AB (ref 4.8–5.6)
MEAN PLASMA GLUCOSE: 80 mg/dL

## 2017-03-23 LAB — CBC
HCT: 22.1 % — ABNORMAL LOW (ref 36.0–46.0)
Hemoglobin: 7.8 g/dL — ABNORMAL LOW (ref 12.0–15.0)
MCH: 29.7 pg (ref 26.0–34.0)
MCHC: 35.3 g/dL (ref 30.0–36.0)
MCV: 84 fL (ref 78.0–100.0)
PLATELETS: 610 10*3/uL — AB (ref 150–400)
RBC: 2.63 MIL/uL — AB (ref 3.87–5.11)
RDW: 19.2 % — AB (ref 11.5–15.5)
WBC: 19.7 10*3/uL — AB (ref 4.0–10.5)

## 2017-03-23 NOTE — Progress Notes (Signed)
CSW met with patient at bedside to offer support and resources as requested for childcare information. Upon this writer's arrival, patient was warm and welcoming. CSW inquired about patients children's age. Patient notes her children are 6 and 3 and she has no additional family support aside from her husband who works during the week M-F. Patient notes if her husband does not go to work they will have no money to pay bills; thus, she needs assistance arranging child care until she is discharged from the hospital. This informed patient she is not confident there is a resource available that will allow her to be able to arrange care so soon; however, she could try contacting [place] [place] at [phone]. Patient noted she will give that a try. CSW will make weekday CSW's aware in case there are other resources available.   Salem Lembke, MSW, LCSW-A Clinical Social Worker  [facility]  Office: [phone]

## 2017-03-23 NOTE — Progress Notes (Signed)
Patient ID: [name], female   DOB: Nov 28, 1970, 46 y.o.   MRN: [id number] Subjective:  Patient said her pain is better, controlled with current regimen. Her major concern is child care for her 2 children at home, her husband has to work and no one else at home to care for them. Social Worker has being consulted, awaiting definitive recommendation. No fever, no abdominal pain or cramping, no chest pain or SOB  Objective:  Vital signs in last 24 hours:  Vitals:   03/22/17 1720 03/22/17 2351 03/23/17 1006 03/23/17 1007  BP:  (!) 107/58  101/64  Pulse:  83  72  Resp:  16  16  Temp: 98.3 F (36.8 C) 98.6 F (37 C)  98.2 F (36.8 C)  TempSrc: Oral Oral  Oral  SpO2:  96% 99%   Weight:      Height:        Intake/Output from previous day:  No intake or output data in the 24 hours ending 03/23/17 1242  Physical Exam: General: Alert, awake, oriented x3, in no acute distress.  HEENT: [place]/AT PEERL, EOMI Neck: Trachea midline,  no masses, no thyromegal,y no JVD, no carotid bruit OROPHARYNX:  Moist, No exudate/ erythema/lesions.  Heart: Regular rate and rhythm, without murmurs, rubs, gallops, PMI non-displaced, no heaves or thrills on palpation.  Lungs: Clear to auscultation, no wheezing or rhonchi noted. No increased vocal fremitus resonant to percussion  Abdomen: Gravid, Soft, nontender, nondistended, positive bowel sounds, no masses no hepatosplenomegaly noted..  Neuro: No focal neurological deficits noted cranial nerves II through XII grossly intact. DTRs 2+ bilaterally upper and lower extremities. Strength 5 out of 5 in bilateral upper and lower extremities. Psychiatric: Patient alert and oriented x3, good insight and cognition, good recent to remote recall. Lymph node survey: No cervical axillary or inguinal lymphadenopathy noted.  Lab Results:  Basic Metabolic Panel:    Component Value Date/Time   NA 133 (L) 03/23/2017 0509   NA 135 12/16/2016 1542   NA 139 01/20/2014 0758    K 3.7 03/23/2017 0509   K 3.9 01/20/2014 0758   CL 104 03/23/2017 0509   CO2 23 03/23/2017 0509   CO2 23 01/20/2014 0758   BUN <5 (L) 03/23/2017 0509   BUN 6 12/16/2016 1542   BUN 6.0 (L) 01/20/2014 0758   CREATININE <0.30 (L) 03/23/2017 0509   CREATININE 0.30 (L) 12/20/2016 0925   CREATININE 0.5 (L) 01/20/2014 0758   GLUCOSE 85 03/23/2017 0509   GLUCOSE 72 01/20/2014 0758   CALCIUM 9.3 03/23/2017 0509   CALCIUM 9.0 01/20/2014 0758   CBC:    Component Value Date/Time   WBC 19.7 (H) 03/23/2017 0509   HGB 7.8 (L) 03/23/2017 0509   HGB 7.3 (L) 12/16/2016 1542   HGB 8.7 (L) 01/20/2014 0757   HCT 22.1 (L) 03/23/2017 0509   HCT 22.8 (L) 12/20/2016 0924   HCT 25.2 (L) 01/20/2014 0757   PLT 610 (H) 03/23/2017 0509   PLT 553 (H) 12/16/2016 1542   MCV 84.0 03/23/2017 0509   MCV 86.7 12/20/2016 0924   MCV 84.3 01/20/2014 0757   NEUTROABS 9.0 (H) 03/11/2017 0347   NEUTROABS 9.2 (H) 12/16/2016 1542   NEUTROABS 8.7 (H) 01/20/2014 0757   LYMPHSABS 1.5 03/11/2017 0347   LYMPHSABS 2.5 12/16/2016 1542   LYMPHSABS 2.7 01/20/2014 0757   MONOABS 1.7 (H) 03/11/2017 0347   MONOABS 1.9 (H) 01/20/2014 0757   EOSABS 0.5 03/11/2017 0347   EOSABS 0.6 (H) 12/16/2016 1542  BASOSABS 0.0 03/11/2017 0347   BASOSABS 0.1 12/16/2016 1542   BASOSABS 0.2 (H) 01/20/2014 0757    Recent Results (from the past 240 hour(s))  Culture, OB Urine     Status: Abnormal   Collection Time: 03/20/17 11:00 AM  Result Value Ref Range Status   Specimen Description URINE, CLEAN CATCH  Final   Special Requests NONE  Final   Culture (A)  Final    MULTIPLE SPECIES PRESENT, SUGGEST RECOLLECTION NO GROUP B STREP (S.AGALACTIAE) ISOLATED Performed at [facility], [place] [address], [place], [place] [address]    Report Status 03/21/2017 FINAL  Final    Studies/Results: No results found.  Medications: Scheduled Meds: . amoxicillin  500 mg Oral Q8H  . aspirin EC  81 mg Oral Daily  . azithromycin  500 mg  Oral Daily  . enoxaparin (LOVENOX) injection  40 mg Subcutaneous Q24H  . oxyCODONE  10 mg Oral Q4H  . prenatal multivitamin  1 tablet Oral Q1200   Continuous Infusions: PRN Meds:.acetaminophen, calcium carbonate, docusate sodium, HYDROmorphone (DILAUDID) injection, zolpidem  Assessment/Plan: Active Problems:   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[place])   Preterm premature rupture of membranes   History of preterm delivery, currently pregnant in second trimester   Umbilical vein abnormality affecting pregnancy (varix)  1. Hb SS with Crisis: Continue schedule Oxycodone and IV Dilaudid 1 mg 1-2 hrs prn. 2. Anemia of Chronic Disease: Defer to High risk obstetrics. At present adequate from Sickle Cell perspective.  3. PPROM: Patient still having few brownish discharge with some fluid ?amniotic. On strict bed rest. Continue care per OBGYN  Code Status: Full Code Family Communication: N/A Disposition Plan: Not yet ready for discharge  Jazmine Longshore  If 7PM-7AM, please contact night-coverage.  03/23/2017, 12:42 PM  LOS: 3 days

## 2017-03-23 NOTE — Progress Notes (Signed)
Patient ID: [name], female   DOB: 1971/02/25, 46 y.o.   MRN: [id number] [place] COMPREHENSIVE PROGRESS NOTE  [name] is a 46 y.o. [id number] at [redacted]w[redacted]d  who is admitted for PROM.   Fetal presentation is breech. Length of Stay:  3  Days  Subjective: Pt reports continuing to have some LOF and occ blood clots. Reports + FM. Denies cramps or pressure.    Vitals:  Blood pressure (!) 107/58, pulse 83, temperature 98.6 F (37 C), temperature source Oral, resp. rate 16, height 5\' 7"  (1.702 m), weight 55.8 kg (123 lb), last menstrual period 10/10/2016, SpO2 96 %.   Physical Examination: Lungs clear Heart RRR Abd soft + BS gravid  Ext non tender  Fetal Monitoring: FHT's 120's per nursing  Labs:  Results for orders placed or performed during the hospital encounter of 03/20/17 (from the past 24 hour(s))  CBC   Collection Time: 03/23/17  5:09 AM  Result Value Ref Range   WBC 19.7 (H) 4.0 - 10.5 K/uL   RBC 2.63 (L) 3.87 - 5.11 MIL/uL   Hemoglobin 7.8 (L) 12.0 - 15.0 g/dL   HCT 22.1 (L) 36.0 - 46.0 %   MCV 84.0 78.0 - 100.0 fL   MCH 29.7 26.0 - 34.0 pg   MCHC 35.3 30.0 - 36.0 g/dL   RDW 19.2 (H) 11.5 - 15.5 %   Platelets 610 (H) 150 - 400 K/uL  Comprehensive metabolic panel   Collection Time: 03/23/17  5:09 AM  Result Value Ref Range   Sodium 133 (L) 135 - 145 mmol/L   Potassium 3.7 3.5 - 5.1 mmol/L   Chloride 104 101 - 111 mmol/L   CO2 23 22 - 32 mmol/L   Glucose, Bld 85 65 - 99 mg/dL   BUN <5 (L) 6 - 20 mg/dL   Creatinine, Ser <0.30 (L) 0.44 - 1.00 mg/dL   Calcium 9.3 8.9 - 10.3 mg/dL   Total Protein 6.2 (L) 6.5 - 8.1 g/dL   Albumin 2.5 (L) 3.5 - 5.0 g/dL   AST 44 (H) 15 - 41 U/L   ALT 19 14 - 54 U/L   Alkaline Phosphatase 107 38 - 126 U/L   Total Bilirubin 2.9 (H) 0.3 - 1.2 mg/dL   GFR calc non Af Amer NOT CALCULATED >60 mL/min   GFR calc Af Amer NOT CALCULATED >60 mL/min   Anion gap 6 5 - 15    Imaging Studies:       Medications:   Scheduled . amoxicillin  500 mg Oral Q8H  . aspirin EC  81 mg Oral Daily  . azithromycin  500 mg Oral Daily  . enoxaparin (LOVENOX) injection  40 mg Subcutaneous Q24H  . oxyCODONE  10 mg Oral Q4H  . potassium chloride  20 mEq Oral BID WC  . prenatal multivitamin  1 tablet Oral Q1200   I have reviewed the patient's current medications.  ASSESSMENT: Patient Active Problem List   Diagnosis Date Noted  . Preterm premature rupture of membranes 03/20/2017  . History of preterm delivery, currently pregnant in second trimester 03/20/2017  . Umbilical vein abnormality affecting pregnancy (varix) 03/20/2017  . Second trimester pregnancy   . 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[place]) 03/02/2017  . Partial placenta previa - left lateral previa 02/21/2017  . Abnormal MSAFP (maternal serum alpha-fetoprotein), elevated 02/18/2017  . Subchorionic hematoma in second trimester 12/23/2016  . Supervision of high risk pregnancy, antepartum 12/16/2016  . History of pre-eclampsia 12/16/2016  .  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 Advanced maternal age in multigravida 07/28/2013  . Previous cesarean delivery x 2 07/22/2013  . Hemochromatosis 11/10/2012  . Chronic pain syndrome 10/13/2012  . Leukocytosis 03/30/2012    PLAN: Stable Continue with latency antibiotics. SW to see pt today regarding child care issues Continue routine antenatal care.   [name] 03/23/2017,7:43 AM

## 2017-03-24 ENCOUNTER — Ambulatory Visit: Payer: Medicaid Other | Admitting: Family Medicine

## 2017-03-24 DIAGNOSIS — O42912 Preterm premature rupture of membranes, unspecified as to length of time between rupture and onset of labor, second trimester: Secondary | ICD-10-CM

## 2017-03-24 DIAGNOSIS — D5700 Hb-SS disease with crisis, unspecified: Secondary | ICD-10-CM

## 2017-03-24 DIAGNOSIS — O99012 Anemia complicating pregnancy, second trimester: Secondary | ICD-10-CM

## 2017-03-24 MED ORDER — BETAMETHASONE SOD PHOS & ACET 6 (3-3) MG/ML IJ SUSP
12.0000 mg | INTRAMUSCULAR | Status: AC
  Administered 2017-03-25 – 2017-03-26 (×2): 12 mg via INTRAMUSCULAR
  Filled 2017-03-24 (×2): qty 2

## 2017-03-24 NOTE — Progress Notes (Signed)
Patient ID: [name], female   DOB: 01-07-1971, 46 y.o.   MRN: [id number] [name] COMPREHENSIVE PROGRESS NOTE  [name] is a 46 y.o. [id number] at [redacted]w[redacted]d  who is admitted for PPROM and sickle cell crisis.   Fetal presentation is breech. Length of Stay:  4  Days  Subjective: Reports leg cramps.  No abdominal pain.   Vitals:  Blood pressure 104/63, pulse 72, temperature 98.2 F (36.8 C), temperature source Oral, resp. rate 18, height 5\' 7"  (1.702 m), weight 123 lb (55.8 kg), last menstrual period 10/10/2016, SpO2 98 %.   Physical Examination: Lungs clear Heart RRR Abd soft + BS gravid  Ext non tender  Fetal Monitoring: FHT's 157  Labs:  No results found for this or any previous visit (from the past 24 hour(s)).  Imaging Studies:       Medications:  Scheduled . amoxicillin  500 mg Oral Q8H  . aspirin EC  81 mg Oral Daily  . azithromycin  500 mg Oral Daily  . enoxaparin (LOVENOX) injection  40 mg Subcutaneous Q24H  . oxyCODONE  10 mg Oral Q4H  . prenatal multivitamin  1 tablet Oral Q1200   I have reviewed the patient's current medications.  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 Will start betamethasone regimem tomorrow. 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  Continue routine antenatal care.   [name] Schneiders,  MD 03/24/2017,12:15 PM

## 2017-03-24 NOTE — Progress Notes (Addendum)
1951: Patient seen in bed awake, alert and oriented x 4. Pt c/o pain to  her Rt. Leg 10/10 which she described as aching and burning.  Oxy  IR 10 mg administered. We will continue to monitor.  2100: K-Pad, warm heat applied to aching body.  2215: Patient seen asleep.  2330: Patient requested pain medication for rt leg pain 10/10, Dilaudid 1  mg IV administered. We will continue to monitor.   0005: Rt leg pain reassessed, pt reported a score of 10/10 Scheduled  Oxy IR administered. We will continue to monitor.  0030: Pt is seen sitting up in bed attempting to eat a late snack. No c/o  pain at this time. We will continue to monitor  0200: Rt leg pain reassess, pt describes her pain as "feeling better  7/10'. Pt stated that this score is tolerable and she does not  which to have  any pain medication at this time.  However, pt  was instructed to call for pain medication before the pain   becomes to high where it will be difficult to to bring it back to an  acceptable level of 7. Pt further stated that her pain is chronic  and has never gotten better. We encouraged her to turn her  television off and try and go to sleep. We will continue to monitor.  0630: Pt stated her pain "is better". We will continue to monitor.

## 2017-03-25 ENCOUNTER — Encounter (HOSPITAL_COMMUNITY): Payer: Self-pay

## 2017-03-25 ENCOUNTER — Other Ambulatory Visit: Payer: Self-pay

## 2017-03-25 LAB — BASIC METABOLIC PANEL
ANION GAP: 7 (ref 5–15)
BUN: <5 mg/dL — ABNORMAL LOW (ref 6–20)
CALCIUM: 9.4 mg/dL (ref 8.9–10.3)
CO2: 24 mmol/L (ref 22–32)
Chloride: 105 mmol/L (ref 101–111)
Creatinine, Ser: 0.32 mg/dL — ABNORMAL LOW (ref 0.44–1.00)
GFR calc non Af Amer: >60 mL/min (ref >60)
GLUCOSE: 114 mg/dL — AB (ref 65–99)
Potassium: 3.7 mmol/L (ref 3.5–5.1)
Sodium: 136 mmol/L (ref 135–145)

## 2017-03-25 LAB — CBC WITH DIFFERENTIAL/PLATELET
BASOS ABS: 0.2 10*3/uL — AB (ref 0.0–0.1)
Basophils Relative: 1 %
EOS PCT: 2 %
Eosinophils Absolute: 0.4 10*3/uL (ref 0.0–0.7)
HEMATOCRIT: 26.3 % — AB (ref 36.0–46.0)
HEMOGLOBIN: 9 g/dL — AB (ref 12.0–15.0)
Lymphocytes Relative: 13 %
Lymphs Abs: 2.3 10*3/uL (ref 0.7–4.0)
MCH: 29.6 pg (ref 26.0–34.0)
MCHC: 34.2 g/dL (ref 30.0–36.0)
MCV: 86.5 fL (ref 78.0–100.0)
MONOS PCT: 11 %
Monocytes Absolute: 1.9 10*3/uL — ABNORMAL HIGH (ref 0.1–1.0)
NEUTROS PCT: 73 %
Neutro Abs: 12.9 10*3/uL — ABNORMAL HIGH (ref 1.7–7.7)
Other: 0 %
Platelets: 625 10*3/uL — ABNORMAL HIGH (ref 150–400)
RBC: 3.04 MIL/uL — AB (ref 3.87–5.11)
RDW: 21.3 % — ABNORMAL HIGH (ref 11.5–15.5)
WBC: 17.7 10*3/uL — AB (ref 4.0–10.5)

## 2017-03-25 LAB — RETICULOCYTES
RBC.: 3.04 MIL/uL — AB (ref 3.87–5.11)
RETIC COUNT ABSOLUTE: 556.3 10*3/uL — AB (ref 19.0–186.0)
Retic Ct Pct: 18.3 % — ABNORMAL HIGH (ref 0.4–3.1)

## 2017-03-25 MED ORDER — FOLIC ACID 1 MG PO TABS
5.0000 mg | ORAL_TABLET | Freq: Every day | ORAL | Status: DC
  Administered 2017-03-25 – 2017-05-07 (×44): 5 mg via ORAL
  Filled 2017-03-25 (×44): qty 5

## 2017-03-25 MED ORDER — DOCUSATE SODIUM 100 MG PO CAPS
100.0000 mg | ORAL_CAPSULE | Freq: Two times a day (BID) | ORAL | Status: DC
  Administered 2017-03-25 – 2017-04-27 (×67): 100 mg via ORAL
  Filled 2017-03-25 (×67): qty 1

## 2017-03-25 NOTE — Plan of Care (Signed)
Education: Knowledge of the prescribed therapeutic regimen will improve 03/25/2017 1725 - Progressing by Lucinda Dell, RN  Pt has been educated on second round of BMZ & is aware that she will likely remain hospitalized until delivery of infant.  She is able to verbalize ex Skin Integrity: Risk for impaired skin integrity will decrease 03/25/2017 1725 - Progressing by Lucinda Dell, RN Pt seen by wound care RN today "Her wounds are essentially healed on the RLE, however the skin is vunerable and newly epithelialized".  Pt had gauze on it this am, but removed it when wound care RN assessed site.  Pt is very familiar with wound treatment due to sickle cell dx/crisis.  Has declined to use Aquacel AG due to it does not work & prefers to wait until gel available.  Pt declines wrapping wound at this time due to healing nature.   Physical Regulation: Complications related to the disease process, condition or treatment will be avoided or minimized 03/25/2017 1725 - Progressing by Elza Rafter D, RN  Pt able to verbalize/teach back POC as it progresses.

## 2017-03-25 NOTE — Progress Notes (Signed)
Patient ID: [name], female   DOB: [birth year], 46 y.o.   MRN: [id number] [place] COMPREHENSIVE PROGRESS NOTE  [name] is a 46 y.o. [id number] at [redacted]w[redacted]d  who is admitted for PPROM and sickle cell crisis.  Also has right lateral previa with mild bleeding since admission and umbilical vein varix.  Fetal presentation is breech. Length of Stay:  5  Days  Subjective: Reports occasional leg cramps.  No abdominal pain. Mild bleeding overnight, unchanged volume since admission. Changes pads 4 times a day.   Vitals:  Blood pressure 111/65, pulse 72, temperature 98.5 F (36.9 C), resp. rate 18, height 5\' 7"  (1.702 m), weight 123 lb (55.8 kg), last menstrual period 10/10/2016, SpO2 97 %.   Physical Examination: Gen NAD Lungs Normal respiratory effort Heart Regular rate Abd soft + BS gravid  Pelvis Scant red-brown blood on pad Ext non tender, bandages in place over wound son legs  Fetal Monitoring: FHT's 150s, reassuring for GA  Labs:  Results for orders placed or performed during the hospital encounter of 03/20/17 (from the past 24 hour(s))  Type and screen [place]   Collection Time: 03/25/17  8:39 AM  Result Value Ref Range   ABO/RH(D) B POS    Antibody Screen PENDING    Sample Expiration 03/28/2017    CBC Latest Ref Rng & Units 03/23/2017 03/20/2017 03/11/2017  WBC 4.0 - 10.5 K/uL 19.7(H) 14.6(H) 12.7(H)  Hemoglobin 12.0 - 15.0 g/dL 7.8(L) 8.9(L) 9.0(L)  Hematocrit 36.0 - 46.0 % 22.1(L) 24.5(L) 26.4(L)  Platelets 150 - 400 K/uL 610(H) 729(H) 454(H)    Imaging Studies:     [place] Mfm Ob Follow Up  Result Date: 03/20/2017 ----------------------------------------------------------------------  OBSTETRICS REPORT                      (Signed Final 03/20/2017 10:37 am) ---------------------------------------------------------------------- Patient Info  ID #:       [id number]                          D.O.B.:  May 16, 2071 (46 yrs)  Name:       [name]                  Visit Date: 03/20/2017 09:25 am ---------------------------------------------------------------------- Performed By  Performed By:     Hubert Azure          Secondary Phy.:   [facility]                                                             [facility]  Attending:        Abram Sander MD         Address:          [facility]                                                             [place], [place]  Referred By:      Sallyanne Havers               Location:         [facility] MD  Ref. Address:     [place]                    [place], [place] ---------------------------------------------------------------------- Orders   #  Description                                 Code   1  [place] MFM OB FOLLOW UP                         26333.54  ----------------------------------------------------------------------   #  Ordered By               Order #        Accession #    Episode #   1  Renella Cunas            [id number]  [id number]     [id number]  ---------------------------------------------------------------------- Indications   [redacted] weeks gestation of pregnancy                Z3A.16   Advanced maternal age multigravida 42+         O74.522   (52), second trimester; quad screen + for   T21 (NML NIPS)   Poor obstetric history: Previous IUFD          O84.299   (stillbirth) @ 28wks   Poor obstetric history: Previous preterm       O09.219   delivery, antepartum   Poor obstetric  history: Previous               O09.299   preeclampsia / eclampsia/gestational HTN   Abnormal biochemical screen (quad);            O28.9   elevated MSAFP (7.9 MoM) and inhibin (4.6   MoM)   Previous cesarean delivery, antepartum (X 2)   O34.219   Maternal sickle cell anemia, second trimester  O99.012, D57.1  ---------------------------------------------------------------------- OB History  Gravidity:    8         Term:   1        Prem:   3        SAB:   3  Living:       2 ---------------------------------------------------------------------- Fetal Evaluation  Num Of Fetuses:     1  Fetal Heart         171  Rate(bpm):  Cardiac Activity:   Observed  Presentation:       Breech  Placenta:           Right lateral, previa  P. Cord Insertion:  Visualized, central  Amniotic Fluid  AFI FV:      Oligohydramnios                              Largest Pocket(cm)                              0.82  Comment:    "[place]" still noted (4.9x2.9x4.4) ---------------------------------------------------------------------- Biometry  BPD:      60.9  mm     G. Age:  24w 5d         95  %    CI:        76.62   %    70 - 86                                                          FL/HC:      18.8   %    19.2 - 20.8  HC:      220.4  mm     G. Age:  24w 0d         77  %    HC/AC:      1.03        1.05 - 1.21  AC:      214.2  mm     G. Age:  25w 6d       > 97  %    FL/BPD:     68.1   %  71 - 87  FL:       41.5  mm     G. Age:  23w 4d         55  %    FL/AC:      19.4   %    20 - 24  Est. FW:     737  gm    1 lb 10 oz      77  % ---------------------------------------------------------------------- Gestational Age  LMP:           23w 0d        Date:  10/10/16                 EDD:   07/17/17  U/S Today:     24w 4d                                        EDD:   07/06/17  Best:          23w 0d     Det. By:  LMP  (10/10/16)          EDD:   07/17/17 ---------------------------------------------------------------------- Anatomy  Cranium:                Appears normal         Aortic Arch:            Previously seen  Cavum:                 Appears normal         Ductal Arch:            Previously seen  Ventricles:            Appears normal         Diaphragm:              Appears normal  Choroid Plexus:        Previously seen        Stomach:                Appears normal, left                                                                        sided  Cerebellum:            Previously seen        Abdomen:                Umbilical vein                                                                        varix (1cm)  Posterior Fossa:       Previously seen        Abdominal Wall:         Previously seen  Nuchal Fold:  Previously seen        Cord Vessels:           Previously seen  Face:                  Orbits and profile     Kidneys:                Appear normal                         previously seen  Lips:                  Previously seen        Bladder:                Appears normal  Thoracic:              Previously seen        Spine:                  Previously seen  Heart:                 Previously seen        Upper Extremities:      Previously seen  RVOT:                  Previously seen        Lower Extremities:      Previously seen  LVOT:                  Previously seen  Other:  Female gender previously seen.. Technically difficult due to low amniotic          fluid.Heels and 5th digit previously seen. ---------------------------------------------------------------------- Cervix Uterus Adnexa  Cervix  Length:            2.9  cm.  Appears closed, without funnelling.  Uterus  No abnormality visualized.  Left Ovary  Not visualized.  Right Ovary  Not visualized.  Adnexa:       No abnormality visualized. ---------------------------------------------------------------------- Comments  Elevated MSAFP with low-risk cfDNA and normal anatomy.  The patient reports longstanding vaginal bleeding, more  watery in character recently.  ---------------------------------------------------------------------- Impression  Single living intrauterine pregnancy at 23w 0d.  Breech presentation.  Posterior, right lateral placenta previa again seen.  Subchorionic hematoma again seen measuring  4.9x2.9x4.4cm.  New oligohydramnios with MVP 0.82cm (previously 5.35cm  on 10/04).  Normal fetal growth.  An umbilical vein varix is visualized measuring 1.0cm. No  filling defect seen on color doppler flow.  Otherwise normal interval fetal anatomy. ---------------------------------------------------------------------- Recommendations  Concern for PPROM due to significant decrease in amniotic  fluid volume, watery vaginal bleeding, and normal fetal growth  (placental insufficiency less likely).  Discussed new oligohydramios, concern for PPROM, and  new finding of umbilical vein varix with the patient.  Recommend presentation to MAU for further evaluation.  Findings discussed with Dr. Ilda Basset.  Recommend weekly ultrasound for doppler assessment of  umbilical vein varix until 28 weeks, twice weekly thereafter if  undelivered.  Recommend follow-up ultrasound examination in 4 weeks for  reassessment of fetal growth. ----------------------------------------------------------------------                   Abram Sander, MD Electronically Signed Final Report   03/20/2017 10:37 am ----------------------------------------------------------------------  Medications:  Scheduled . amoxicillin  500 mg Oral Q8H  . aspirin EC  81 mg Oral Daily  .  azithromycin  500 mg Oral Daily  . betamethasone acetate-betamethasone sodium phosphate  12 mg Intramuscular Q24 Hr x 2  . docusate sodium  100 mg Oral BID  . enoxaparin (LOVENOX) injection  40 mg Subcutaneous Q24H  . oxyCODONE  10 mg Oral Q4H  . prenatal multivitamin  1 tablet Oral Q1200   I have reviewed the patient's current medications.  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 Start betamethasone regimen today Wound care consulted for leg wounds Umbilical artery dopplers weekly for umbilical vein varix Continue pain regimen as per Sickle Cell service, appreciate their input.  Continue to monitor bleeding closely, CBC pending today. Continue routine antenatal care.   [name], MD 03/25/2017,10:19 AM

## 2017-03-25 NOTE — Progress Notes (Signed)
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 Hb Ss with Crisis: Crisis appears to be essentially resolving and pain is close to baseline with mostly use of the oral analgesics. So far today she has required only 2 doses of IV Dilaudid for breakthrough pain. Will continue current pain regimen and if she still is not requiring increased doses of Dilaudid for breakthrough pain by tomorrow, will adjust IV Dilaudid to every 3 hours. 2. Anemia of Chronic Disease: Hb stable.  3. Leukocytosis: No evidence of infection.Related to crisis. Pt has an elevated WBC at baseline.  4. Hypokalemia: resolved.    Code Status: Full Code Family Communication: N/A Disposition Plan: Not yet ready for discharge  [place].  Pager [phone]. If 7PM-7AM, please contact night-coverage.  03/25/2017, 5:10 PM  LOS: 5 days   Interim History: Pt reports that pain is localized only to the RLE and at an intensity of 6/10.  She is feeling much better than on admission.     Objective: Vitals:   03/25/17 0454 03/25/17 0800 03/25/17 1200 03/25/17 1622  BP: (!) 101/53 111/65 (!) 98/56 105/62  Pulse: 69 72 78 90  Resp: 18 18 18 18   Temp: 98 F (36.7 C) 98.5 F (36.9 C) 98.7 F (37.1 C) 98.4 F (36.9 C)  TempSrc: Oral  Oral Oral  SpO2: 97%   97%  Weight:      Height:       Weight change:    Intake/Output Summary (Last 24 hours) at 03/25/2017 1710 Last data filed at 03/25/2017 0454 Gross per 24 hour  Intake -  Output 1800 ml  Net -1800 ml     Physical Exam General: Alert, awake, oriented x3, in no acute distress.  HEENT: [place]/AT PEERL, EOMI, anicteric Neck: Trachea midline,  no masses, no thyromegal,y no JVD, no carotid bruit OROPHARYNX:  Moist, No exudate/ erythema/lesions.  Heart: Regular rate and rhythm, without murmurs, rubs, gallops, PMI non-displaced, no heaves or thrills on palpation.  Lungs: Clear to auscultation, no wheezing or rhonchi noted. No increased vocal fremitus resonant to percussion  Abdomen: Gravid Neuro: No focal neurological deficits noted cranial nerves II through XII grossly intact. Strength at baseline in bilateral upper and lower extremities. Musculoskeletal: No warmth swelling or erythema around joints, no spinal tenderness noted. Psychiatric: Patient alert and oriented x3, good insight and cognition, good recent to remote recall.   Data Reviewed: Basic Metabolic Panel: Recent Labs  Lab 03/20/17 1808 03/23/17 0509 03/25/17 1005  NA 135 133* 136  K 3.4* 3.7 3.7  CL 106 104 105  CO2 22 23 24   GLUCOSE 114* 85 114*  BUN <5* <5* <5*  CREATININE 0.38* <0.30* 0.32*  CALCIUM 8.9 9.3 9.4  MG 1.6*  --   --    Liver Function Tests: Recent Labs  Lab 03/20/17 1808 03/23/17 0509  AST 43* 44*  ALT 22 19  ALKPHOS 128* 107  BILITOT  3.3* 2.9*  PROT 7.3 6.2*  ALBUMIN 3.0* 2.5*   No results for input(s): LIPASE, AMYLASE in the last 168 hours. No results for input(s): AMMONIA in the last 168 hours. CBC: Recent Labs  Lab 03/20/17 1808 03/23/17 0509 03/25/17 1005  WBC 14.6* 19.7* 17.7*  NEUTROABS  --   --  12.9*  HGB 8.9* 7.8* 9.0*  HCT 24.5* 22.1* 26.3*  MCV 81.9 84.0 86.5  PLT 729* 610* 625*   Cardiac Enzymes: No results for input(s): CKTOTAL, CKMB, CKMBINDEX, TROPONINI in the last 168 hours. BNP (last 3 results) Recent Labs     08/15/16 2007  BNP 48.3    ProBNP (last 3 results) No results for input(s): PROBNP in the last 8760 hours.  CBG: No results for input(s): GLUCAP in the last 168 hours.  Recent Results (from the past 240 hour(s))  Culture, OB Urine     Status: Abnormal   Collection Time: 03/20/17 11:00 AM  Result Value Ref Range Status   Specimen Description URINE, CLEAN CATCH  Final   Special Requests NONE  Final   Culture (A)  Final    MULTIPLE SPECIES PRESENT, SUGGEST RECOLLECTION NO GROUP B STREP (S.AGALACTIAE) ISOLATED Performed at [facility], [address], [place], [place] [address]    Report Status 03/21/2017 FINAL  Final     Studies: [place] Mfm Ob Follow Up  Result Date: 03/20/2017 ----------------------------------------------------------------------  OBSTETRICS REPORT                      (Signed Final 03/20/2017 10:37 am) ---------------------------------------------------------------------- Patient Info  ID #:       [id number]                          D.O.B.:  03/31/2071 (46 yrs)  Name:       [name]                  Visit Date: 03/20/2017 09:25 am ---------------------------------------------------------------------- Performed By  Performed By:     Hubert Azure          Secondary Phy.:   [facility]                                                             [place]  Attending:        Abram Sander MD         Address:  [facility]                                                             [place], [place]  Referred By:      Sallyanne Havers               Location:         [facility] MD  Ref. Address:     [place], [place] ---------------------------------------------------------------------- Orders   #  Description                                 Code   1  [place] MFM OB FOLLOW UP                         67209.47  ----------------------------------------------------------------------   #  Ordered By               Order #        Accession #    Episode #   1  Renella Cunas            [id number]      [id number]     [id number]  ---------------------------------------------------------------------- Indications   [redacted] weeks gestation of pregnancy  Z3A.23   Advanced maternal age multigravida 28+         O45.522   (31), second trimester; quad screen + for   T21 (NML NIPS)   Poor obstetric history: Previous IUFD          O22.299   (stillbirth) @ 28wks   Poor obstetric history: Previous preterm       O09.219   delivery, antepartum   Poor obstetric history: Previous               O09.299   preeclampsia / eclampsia/gestational HTN   Abnormal biochemical screen (quad);            O28.9   elevated MSAFP (7.9 MoM) and inhibin (4.6   MoM)   Previous cesarean delivery, antepartum (X 2)   O34.219   Maternal sickle cell anemia, second trimester  O99.012, D57.1  ---------------------------------------------------------------------- OB History  Gravidity:    8         Term:   1        Prem:   3        SAB:   3  Living:       2 ---------------------------------------------------------------------- Fetal Evaluation  Num Of Fetuses:     1  Fetal Heart         171  Rate(bpm):  Cardiac Activity:   Observed  Presentation:       Breech  Placenta:           Right lateral, previa  P. Cord Insertion:  Visualized, central  Amniotic Fluid  AFI FV:      Oligohydramnios                              Largest Pocket(cm)                              0.82  Comment:     "[place]" still noted (4.9x2.9x4.4) ---------------------------------------------------------------------- Biometry  BPD:      60.9  mm     G. Age:  24w 5d         95  %    CI:        76.62   %    70 - 86                                                          FL/HC:      18.8   %    19.2 - 20.8  HC:      220.4  mm     G. Age:  24w 0d         77  %    HC/AC:      1.03        1.05 - 1.21  AC:      214.2  mm     G. Age:  25w 6d       > 97  %    FL/BPD:     68.1   %    71 - 87  FL:       41.5  mm     G. Age:  23w 4d  55  %    FL/AC:      19.4   %    20 - 24  Est. FW:     737  gm    1 lb 10 oz      77  % ---------------------------------------------------------------------- Gestational Age  LMP:           23w 0d        Date:  10/10/16                 EDD:   07/17/17  U/S Today:     24w 4d                                        EDD:   07/06/17  Best:          23w 0d     Det. By:  LMP  (10/10/16)          EDD:   07/17/17 ---------------------------------------------------------------------- Anatomy  Cranium:               Appears normal         Aortic Arch:            Previously seen  Cavum:                 Appears normal         Ductal Arch:            Previously seen  Ventricles:            Appears normal         Diaphragm:              Appears normal  Choroid Plexus:        Previously seen        Stomach:                Appears normal, left                                                                        sided  Cerebellum:            Previously seen        Abdomen:                Umbilical vein                                                                        varix (1cm)  Posterior Fossa:       Previously seen        Abdominal Wall:         Previously seen  Nuchal Fold:           Previously seen        Cord Vessels:           Previously seen  Face:  Orbits and profile     Kidneys:                Appear normal                         previously seen  Lips:                  Previously seen         Bladder:                Appears normal  Thoracic:              Previously seen        Spine:                  Previously seen  Heart:                 Previously seen        Upper Extremities:      Previously seen  RVOT:                  Previously seen        Lower Extremities:      Previously seen  LVOT:                  Previously seen  Other:  Female gender previously seen.. Technically difficult due to low amniotic          fluid.Heels and 5th digit previously seen. ---------------------------------------------------------------------- Cervix Uterus Adnexa  Cervix  Length:            2.9  cm.  Appears closed, without funnelling.  Uterus  No abnormality visualized.  Left Ovary  Not visualized.  Right Ovary  Not visualized.  Adnexa:       No abnormality visualized. ---------------------------------------------------------------------- Comments  Elevated MSAFP with low-risk cfDNA and normal anatomy.  The patient reports longstanding vaginal bleeding, more  watery in character recently. ---------------------------------------------------------------------- Impression  Single living intrauterine pregnancy at 23w 0d.  Breech presentation.  Posterior, right lateral placenta previa again seen.  Subchorionic hematoma again seen measuring  4.9x2.9x4.4cm.  New oligohydramnios with MVP 0.82cm (previously 5.35cm  on 10/04).  Normal fetal growth.  An umbilical vein varix is visualized measuring 1.0cm. No  filling defect seen on color doppler flow.  Otherwise normal interval fetal anatomy. ---------------------------------------------------------------------- Recommendations  Concern for PPROM due to significant decrease in amniotic  fluid volume, watery vaginal bleeding, and normal fetal growth  (placental insufficiency less likely).  Discussed new oligohydramios, concern for PPROM, and  new finding of umbilical vein varix with the patient.  Recommend presentation to MAU for further evaluation.  Findings discussed with  Dr. Ilda Basset.  Recommend weekly ultrasound for doppler assessment of  umbilical vein varix until 28 weeks, twice weekly thereafter if  undelivered.  Recommend follow-up ultrasound examination in 4 weeks for  reassessment of fetal growth. ----------------------------------------------------------------------                   Abram Sander, MD Electronically Signed Final Report   03/20/2017 10:37 am ----------------------------------------------------------------------   Scheduled Meds: . amoxicillin  500 mg Oral Q8H  . aspirin EC  81 mg Oral Daily  . azithromycin  500 mg Oral Daily  . betamethasone acetate-betamethasone sodium phosphate  12 mg Intramuscular Q24 Hr x 2  . docusate sodium  100 mg Oral BID  . enoxaparin (LOVENOX) injection  40 mg Subcutaneous Q24H  .  oxyCODONE  10 mg Oral Q4H  . 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 Greater than 50% involved face to face contact with the patient for assessment, counseling and coordination of care.

## 2017-03-25 NOTE — Consult Note (Signed)
[place] Nurse wound consult note Reason for Consult: LE wounds, known sickle cell complication She is followed by the [facility] wound care center.  Her wounds are essentially healed on the RLE, however the skin is vunerable and newly epithelialized  Wound type: vascular related to sickle cell dx.  Pressure Injury POA: NA Measurement: no real open wounds at this time. Wound LHT:DSKA fragile, new epithelial tissue Drainage (amount, consistency, odor) none Periwound: intact  Dressing procedure/placement/frequency: Continue application of silver gel, the skin is very dry and vunerable and I do not want her to have regression of her current status while inpatient. She will be inpatient until delivery. She is 23wks.  I will provide samples of silver gel tomorrow. Until then orders written for silver hydrofiber to be moistened with sterile water.  Covered with dry dressing and ACE wrap.   Discussed POC with patient and bedside nurse.  Re consult if needed, will not follow at this time. Thanks  Abriella Filkins [employer], RN,CWOCN, CNS, [place] [phone])

## 2017-03-26 LAB — TYPE AND SCREEN
ABO/RH(D): B POS
Antibody Screen: NEGATIVE

## 2017-03-26 MED ORDER — SODIUM CHLORIDE 0.9% FLUSH
3.0000 mL | Freq: Two times a day (BID) | INTRAVENOUS | Status: DC
  Administered 2017-03-26 – 2017-05-06 (×82): 3 mL via INTRAVENOUS

## 2017-03-26 MED ORDER — HYDROMORPHONE HCL 1 MG/ML IJ SOLN
1.0000 mg | INTRAMUSCULAR | Status: DC | PRN
  Administered 2017-03-28 – 2017-05-06 (×80): 1 mg via INTRAVENOUS
  Filled 2017-03-26 (×81): qty 1

## 2017-03-26 NOTE — Progress Notes (Signed)
CSW met with patient by her request, per MD yesterday, to offer support.  Patient was quiet, but very pleasant and welcoming of CSW's visit.  She was tearful at times as she spoke about her husband's need to work and her need for childcare for her two children while she is in the hospital.  She reports that her daughter, Caprice Kluver, attends [employer] in [place], but since her husband has to work (M-F, 7am-6pm) he cannot pick her up from school.  CSW asked where her children are right now.  She reports that her husband has been taking both children to the babysitter in [place] so he can continue working.  CSW asked if she has considered enrolling her daughter in the [employer] after school program and she states she does not know what this is.  CSW asked if her family can afford $50 per week.  She said yes.  She states that she has a cousin in [place] who is able to come, but needs someone to contact the [employer] to request that she be allowed to come her to assist the family.  CSW asked patient for permission to contact her daughter's school to discuss the family's situation and offered to write a letter to the [facility], if needed.  Patient gave consent and stated appreciation for CSW's offer to assist.  She reports that she is feeling well physically, for the most part, and reports a positive relationship with her husband.  She states that their families live "back home" in [place].  She states she has asked friends and neighbors to help, but "everyone is busy" "everyone has their own children."  CSW validated her feelings and offered support in this difficult situation.   CSW contacted School Social Worker/Emily Ronne Binning at [employer] who states patient's daughter has been checked in as present this week.  CSW unsure why patient states she has not been to school this week.  CSW asked that School Social Worker contact CSW if there is any way we can collaborate to support  this family now or in the future.   CSW learned that patient's cousin needs to complete an ESTA form on the [place] [employer] and [employer] and can visit for up to 90 days without a VISA.  Cousin must contact the [employer] in [place] if she wishes to visit here for more than 90 days.   CSW will meet with patient tomorrow to discuss.

## 2017-03-26 NOTE — Progress Notes (Signed)
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 Hb SS with Crisis:  Will continue current oral pain regimen and decrease frequency of Dilaudid for breakthrough pain to every 3 hours. 2. Anemia of Chronic Disease: Hb stable.  3. Leukocytosis: No evidence of infection.Related to crisis. Pt has an elevated WBC at baseline.  4. Hypokalemia: resolved.  5. PROM: Defer to OB. Per patient the plan is for her to remain hospitalized until the baby is delivered.   I will plan to see patient again on Friday 03/28/2017 unless there are unforseen developments.    Code Status: Full Code Family Communication: N/A Disposition Plan: Not yet ready for discharge  [place].  Pager [phone]. If 7PM-7AM, please contact night-coverage.  03/26/2017, 6:27 PM  LOS: 6 days   Interim History: Pt reports that at about 0400 the pain intensified and developed in BLE's rather than just the right side as she was experiencing yesterday. She still rates the pain as 7/10 and  localized to BLE's today. However she still has not required more than 2 doses of IV Dilaudid for breakthrough pain. Per nurse patient refusing wound dressing.    Objective: Vitals:   03/26/17 0436 03/26/17 0800 03/26/17 1200 03/26/17 1600  BP: (!) 109/58 108/62 107/64 124/72  Pulse:  90 69 85 93  Resp: 18 18 18 16   Temp:  98.6 F (37 C) 98.4 F (36.9 C) 98.7 F (37.1 C)  TempSrc:  Oral Oral Oral  SpO2: 96% 97% 97% 97%  Weight:      Height:       Weight change:  No intake or output data in the 24 hours ending 03/26/17 1827   Physical Exam General: Alert, awake, oriented x3, well appearing. HEENT: [place]/AT PEERL, EOMI, anicteric Heart: Regular rate and rhythm, without murmurs, rubs, gallops, PMI non-displaced, no heaves or thrills on palpation.  Lungs: Clear to auscultation, no wheezing or rhonchi noted. No increased vocal fremitus resonant to percussion  Abdomen: Gravid Neuro: No focal neurological deficits noted cranial nerves II through XII grossly intact. Strength at baseline in bilateral upper and lower extremities. Musculoskeletal: No warmth swelling or erythema around joints, no spinal tenderness noted. Psychiatric: Patient alert and oriented x3, good insight and cognition, good recent to remote recall.   Data Reviewed: Basic Metabolic Panel: Recent Labs  Lab 03/20/17 1808 03/23/17 0509 03/25/17 1005  NA 135 133* 136  K 3.4* 3.7 3.7  CL 106 104 105  CO2 22 23 24   GLUCOSE 114* 85 114*  BUN <5* <5* <5*  CREATININE 0.38* <0.30* 0.32*  CALCIUM 8.9 9.3 9.4  MG 1.6*  --   --    Liver Function Tests: Recent Labs  Lab 03/20/17 1808 03/23/17 0509  AST 43* 44*  ALT 22 19  ALKPHOS 128* 107  BILITOT 3.3* 2.9*  PROT 7.3 6.2*  ALBUMIN 3.0* 2.5*   No results  for input(s): LIPASE, AMYLASE in the last 168 hours. No results for input(s): AMMONIA in the last 168 hours. CBC: Recent Labs  Lab 03/20/17 1808 03/23/17 0509 03/25/17 1005  WBC 14.6* 19.7* 17.7*  NEUTROABS  --   --  12.9*  HGB 8.9* 7.8* 9.0*  HCT 24.5* 22.1* 26.3*  MCV 81.9 84.0 86.5  PLT 729* 610* 625*   Cardiac Enzymes: No results for input(s): CKTOTAL, CKMB, CKMBINDEX, TROPONINI in the last 168 hours. BNP (last 3 results) Recent Labs    08/15/16 2007  BNP 48.3    ProBNP  (last 3 results) No results for input(s): PROBNP in the last 8760 hours.  CBG: No results for input(s): GLUCAP in the last 168 hours.  Recent Results (from the past 240 hour(s))  Culture, OB Urine     Status: Abnormal   Collection Time: 03/20/17 11:00 AM  Result Value Ref Range Status   Specimen Description URINE, CLEAN CATCH  Final   Special Requests NONE  Final   Culture (A)  Final    MULTIPLE SPECIES PRESENT, SUGGEST RECOLLECTION NO GROUP B STREP (S.AGALACTIAE) ISOLATED Performed at [facility], [address] [place], [place] [address]    Report Status 03/21/2017 FINAL  Final     Studies: [place] Mfm Ob Follow Up  Result Date: 03/20/2017 ----------------------------------------------------------------------  OBSTETRICS REPORT                      (Signed Final 03/20/2017 10:37 am) ---------------------------------------------------------------------- Patient Info  ID #:       [id number]                          D.O.B.:  2070/10/31 (46 yrs)  Name:       [name]                  Visit Date: 03/20/2017 09:25 am ---------------------------------------------------------------------- Performed By  Performed By:     Hubert Azure          Secondary Phy.:   [place] [facility]                                                             [place]  Attending:        Abram Sander MD         Address:          [facility]                                                             [place], [place]  Referred By:      Sallyanne Havers               Location:          [facility] MD  Ref. Address:     [place]                    [place], [place] ---------------------------------------------------------------------- Orders   #  Description                                 Code   1  [place] MFM OB FOLLOW UP                         82500.37  ----------------------------------------------------------------------   #  Ordered By               Order #        Accession #    Episode #   1  Renella Cunas            [id number]      [id number]     [id number]  ---------------------------------------------------------------------- Indications   [redacted] weeks gestation of pregnancy                Z3A.74   Advanced maternal age multigravida 54+         O60.522   (57), second trimester; quad screen + for   T21 (NML NIPS)   Poor obstetric history: Previous IUFD          O74.299   (stillbirth) @ 28wks   Poor obstetric  history: Previous preterm       O09.219   delivery, antepartum   Poor obstetric history: Previous               O09.299   preeclampsia / eclampsia/gestational HTN   Abnormal biochemical screen (quad);            O28.9   elevated MSAFP (7.9 MoM) and inhibin (4.6   MoM)   Previous cesarean delivery, antepartum (X 2)   O34.219   Maternal sickle cell anemia, second trimester  O99.012, D57.1  ---------------------------------------------------------------------- OB History  Gravidity:    8         Term:   1        Prem:   3        SAB:   3  Living:       2 ---------------------------------------------------------------------- Fetal Evaluation  Num Of Fetuses:     1  Fetal Heart         171  Rate(bpm):  Cardiac Activity:   Observed  Presentation:       Breech  Placenta:           Right lateral, previa  P. Cord Insertion:  Visualized, central  Amniotic Fluid  AFI FV:      Oligohydramnios                              Largest Pocket(cm)                              0.82  Comment:    "[place]" still noted (4.9x2.9x4.4)  ---------------------------------------------------------------------- Biometry  BPD:      60.9  mm     G. Age:  24w 5d         95  %    CI:        76.62   %    70 - 86                                                          FL/HC:      18.8   %    19.2 - 20.8  HC:      220.4  mm     G. Age:  24w 0d         77  %    HC/AC:      1.03        1.05 - 1.21  AC:      214.2  mm     G. Age:  25w 6d       > 97  %    FL/BPD:     68.1   %    71 - 87  FL:       41.5  mm     G. Age:  23w 4d         55  %    FL/AC:      19.4   %    20 - 24  Est. FW:     737  gm    1 lb 10 oz      77  % ---------------------------------------------------------------------- Gestational Age  LMP:  23w 0d        Date:  10/10/16                 EDD:   07/17/17  U/S Today:     24w 4d                                        EDD:   07/06/17  Best:          23w 0d     Det. By:  LMP  (10/10/16)          EDD:   07/17/17 ---------------------------------------------------------------------- Anatomy  Cranium:               Appears normal         Aortic Arch:            Previously seen  Cavum:                 Appears normal         Ductal Arch:            Previously seen  Ventricles:            Appears normal         Diaphragm:              Appears normal  Choroid Plexus:        Previously seen        Stomach:                Appears normal, left                                                                        sided  Cerebellum:            Previously seen        Abdomen:                Umbilical vein                                                                        varix (1cm)  Posterior Fossa:       Previously seen        Abdominal Wall:         Previously seen  Nuchal Fold:           Previously seen        Cord Vessels:           Previously seen  Face:                  Orbits and profile     Kidneys:                Appear normal  previously seen  Lips:                  Previously seen        Bladder:                 Appears normal  Thoracic:              Previously seen        Spine:                  Previously seen  Heart:                 Previously seen        Upper Extremities:      Previously seen  RVOT:                  Previously seen        Lower Extremities:      Previously seen  LVOT:                  Previously seen  Other:  Female gender previously seen.. Technically difficult due to low amniotic          fluid.Heels and 5th digit previously seen. ---------------------------------------------------------------------- Cervix Uterus Adnexa  Cervix  Length:            2.9  cm.  Appears closed, without funnelling.  Uterus  No abnormality visualized.  Left Ovary  Not visualized.  Right Ovary  Not visualized.  Adnexa:       No abnormality visualized. ---------------------------------------------------------------------- Comments  Elevated MSAFP with low-risk cfDNA and normal anatomy.  The patient reports longstanding vaginal bleeding, more  watery in character recently. ---------------------------------------------------------------------- Impression  Single living intrauterine pregnancy at 23w 0d.  Breech presentation.  Posterior, right lateral placenta previa again seen.  Subchorionic hematoma again seen measuring  4.9x2.9x4.4cm.  New oligohydramnios with MVP 0.82cm (previously 5.35cm  on 10/04).  Normal fetal growth.  An umbilical vein varix is visualized measuring 1.0cm. No  filling defect seen on color doppler flow.  Otherwise normal interval fetal anatomy. ---------------------------------------------------------------------- Recommendations  Concern for PPROM due to significant decrease in amniotic  fluid volume, watery vaginal bleeding, and normal fetal growth  (placental insufficiency less likely).  Discussed new oligohydramios, concern for PPROM, and  new finding of umbilical vein varix with the patient.  Recommend presentation to MAU for further evaluation.  Findings discussed with Dr. Ilda Basset.  Recommend weekly  ultrasound for doppler assessment of  umbilical vein varix until 28 weeks, twice weekly thereafter if  undelivered.  Recommend follow-up ultrasound examination in 4 weeks for  reassessment of fetal growth. ----------------------------------------------------------------------                   Abram Sander, MD Electronically Signed Final Report   03/20/2017 10:37 am ----------------------------------------------------------------------   Scheduled Meds: . amoxicillin  500 mg Oral Q8H  . aspirin EC  81 mg Oral Daily  . docusate sodium  100 mg Oral BID  . enoxaparin (LOVENOX) injection  40 mg Subcutaneous Q24H  . folic acid  5 mg Oral Daily  . oxyCODONE  10 mg Oral Q4H  . prenatal multivitamin  1 tablet Oral Q1200  . 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 Greater than 50% involved face to face contact with the patient for assessment, counseling and coordination of care.

## 2017-03-26 NOTE — Consult Note (Signed)
[facility] nurse by to drop off silver gel for patient, attached patient label to the tube and gave to the patient for use.    Re consult if needed, will not follow at this time. Thanks  Caylei Sperry [employer], RN,CWOCN, CNS, [place] [phone])

## 2017-03-26 NOTE — Progress Notes (Signed)
Patient ID: [name], female   DOB: 1970-10-31, 46 y.o.   MRN: [id number] [name] COMPREHENSIVE PROGRESS NOTE  Markan [name] is a 46 y.o. [id number] at [redacted]w[redacted]d  who is admitted for PPROM and sickle cell crisis.  Also has right lateral previa with mild bleeding since admission and umbilical vein varix.  Fetal presentation is breech. Length of Stay:  6  Days  Subjective: Reports worsened leg cramps overnight but controlled on medication.  No abdominal pain. Continued mild bleeding overnight. She is very unhappy about having to be admitted due to PPROM due to childcare issues.   Vitals:  Blood pressure 107/64, pulse 85, temperature 98.4 F (36.9 C), temperature source Oral, resp. rate 18, height 5\' 7"  (1.702 m), weight 123 lb (55.8 kg), last menstrual period 10/10/2016, SpO2 97 %.   Physical Examination: Gen NAD Lungs Normal respiratory effort Heart Regular rate Abd soft + BS gravid  Pelvis Scant red-brown blood on pad Ext non tender  Fetal Monitoring: FHT's 150s, reassuring for GA  Labs:  No results found for this or any previous visit (from the past 24 hour(s)). CBC Latest Ref Rng & Units 03/25/2017 03/23/2017 03/20/2017  WBC 4.0 - 10.5 K/uL 17.7(H) 19.7(H) 14.6(H)  Hemoglobin 12.0 - 15.0 g/dL 9.0(L) 7.8(L) 8.9(L)  Hematocrit 36.0 - 46.0 % 26.3(L) 22.1(L) 24.5(L)  Platelets 150 - 400 K/uL 625(H) 610(H) 729(H)    Imaging Studies:     [place] Mfm Ob Follow Up  Result Date: 03/20/2017 ----------------------------------------------------------------------  OBSTETRICS REPORT                      (Signed Final 03/20/2017 10:37 am) ---------------------------------------------------------------------- Patient Info  ID #:       [id number]                          D.O.B.:  November 24, 2070 (46 yrs)  Name:       [name]                  Visit Date: 03/20/2017 09:25 am ---------------------------------------------------------------------- Performed By  Performed By:     Hubert Azure           Secondary Phy.:   [facility]                                                             [place]  Attending:  Abram Sander MD         Address:          [facility]                                                             [place], [place]  Referred By:      Sallyanne Havers               Location:         [facility] MD  Ref. Address:     [address]                    [place], [place] ---------------------------------------------------------------------- Orders   #  Description                                 Code   1  [place] MFM OB FOLLOW UP                         95621.30  ----------------------------------------------------------------------   #  Ordered By               Order #        Accession #    Episode #   1  Renella Cunas            [id number]      [id number]     [id number]  ----------------------------------------------------------------------  Indications   [redacted] weeks gestation of pregnancy                Z3A.70   Advanced maternal age multigravida 42+         O47.522   (65), second trimester; quad screen + for   T21 (NML NIPS)   Poor obstetric history: Previous IUFD          O33.299   (stillbirth) @ 28wks   Poor obstetric history: Previous preterm       O09.219   delivery, antepartum   Poor obstetric history: Previous               O09.299   preeclampsia / eclampsia/gestational HTN   Abnormal biochemical screen (quad);            O28.9   elevated MSAFP (7.9 MoM) and inhibin  (4.6   MoM)   Previous cesarean delivery, antepartum (X 2)   O34.219   Maternal sickle cell anemia, second trimester  O99.012, D57.1  ---------------------------------------------------------------------- OB History  Gravidity:    8         Term:   1        Prem:   3        SAB:   3  Living:       2 ---------------------------------------------------------------------- Fetal Evaluation  Num Of Fetuses:     1  Fetal Heart         171  Rate(bpm):  Cardiac Activity:   Observed  Presentation:       Breech  Placenta:           Right lateral, previa  P. Cord Insertion:  Visualized, central  Amniotic Fluid  AFI FV:      Oligohydramnios                              Largest Pocket(cm)                              0.82  Comment:    "[place]" still noted (4.9x2.9x4.4) ---------------------------------------------------------------------- Biometry  BPD:      60.9  mm     G. Age:  24w 5d         95  %    CI:        76.62   %    70 - 86                                                          FL/HC:      18.8   %    19.2 - 20.8  HC:      220.4  mm     G. Age:  24w 0d         77  %    HC/AC:      1.03        1.05 - 1.21  AC:      214.2  mm     G. Age:  25w 6d       > 97  %    FL/BPD:     68.1   %    71 - 87  FL:  41.5  mm     G. Age:  23w 4d         55  %    FL/AC:      19.4   %    20 - 24  Est. FW:     737  gm    1 lb 10 oz      77  % ---------------------------------------------------------------------- Gestational Age  LMP:           23w 0d        Date:  10/10/16                 EDD:   07/17/17  U/S Today:     24w 4d                                        EDD:   07/06/17  Best:          23w 0d     Det. By:  LMP  (10/10/16)          EDD:   07/17/17 ---------------------------------------------------------------------- Anatomy  Cranium:               Appears normal         Aortic Arch:            Previously seen  Cavum:                 Appears normal         Ductal Arch:            Previously seen  Ventricles:            Appears  normal         Diaphragm:              Appears normal  Choroid Plexus:        Previously seen        Stomach:                Appears normal, left                                                                        sided  Cerebellum:            Previously seen        Abdomen:                Umbilical vein                                                                        varix (1cm)  Posterior Fossa:       Previously seen        Abdominal Wall:         Previously seen  Nuchal Fold:           Previously seen  Cord Vessels:           Previously seen  Face:                  Orbits and profile     Kidneys:                Appear normal                         previously seen  Lips:                  Previously seen        Bladder:                Appears normal  Thoracic:              Previously seen        Spine:                  Previously seen  Heart:                 Previously seen        Upper Extremities:      Previously seen  RVOT:                  Previously seen        Lower Extremities:      Previously seen  LVOT:                  Previously seen  Other:  Female gender previously seen.. Technically difficult due to low amniotic          fluid.Heels and 5th digit previously seen. ---------------------------------------------------------------------- Cervix Uterus Adnexa  Cervix  Length:            2.9  cm.  Appears closed, without funnelling.  Uterus  No abnormality visualized.  Left Ovary  Not visualized.  Right Ovary  Not visualized.  Adnexa:       No abnormality visualized. ---------------------------------------------------------------------- Comments  Elevated MSAFP with low-risk cfDNA and normal anatomy.  The patient reports longstanding vaginal bleeding, more  watery in character recently. ---------------------------------------------------------------------- Impression  Single living intrauterine pregnancy at 23w 0d.  Breech presentation.  Posterior, right lateral placenta previa again seen.   Subchorionic hematoma again seen measuring  4.9x2.9x4.4cm.  New oligohydramnios with MVP 0.82cm (previously 5.35cm  on 10/04).  Normal fetal growth.  An umbilical vein varix is visualized measuring 1.0cm. No  filling defect seen on color doppler flow.  Otherwise normal interval fetal anatomy. ---------------------------------------------------------------------- Recommendations  Concern for PPROM due to significant decrease in amniotic  fluid volume, watery vaginal bleeding, and normal fetal growth  (placental insufficiency less likely).  Discussed new oligohydramios, concern for PPROM, and  new finding of umbilical vein varix with the patient.  Recommend presentation to MAU for further evaluation.  Findings discussed with Dr. Ilda Basset.  Recommend weekly ultrasound for doppler assessment of  umbilical vein varix until 28 weeks, twice weekly thereafter if  undelivered.  Recommend follow-up ultrasound examination in 4 weeks for  reassessment of fetal growth. ----------------------------------------------------------------------                   Abram Sander, MD Electronically Signed Final Report   03/20/2017 10:37 am ----------------------------------------------------------------------  Medications:  Scheduled . amoxicillin  500 mg Oral Q8H  . aspirin EC  81 mg Oral Daily  . docusate sodium  100 mg Oral BID  .  enoxaparin (LOVENOX) injection  40 mg Subcutaneous Q24H  . folic acid  5 mg Oral Daily  . oxyCODONE  10 mg Oral Q4H  . prenatal multivitamin  1 tablet Oral Q1200  . sodium chloride flush  3 mL Intravenous Q12H   I have reviewed the patient's current medications.  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 Complete betamethasone regimen today Umbilical artery dopplers weekly for umbilical vein varix already ordered Continue pain regimen as per Sickle Cell service, appreciate their input.  Continue to monitor bleeding closely Continue routine antenatal care.   [name], MD 03/26/2017,1:01 PM

## 2017-03-27 ENCOUNTER — Inpatient Hospital Stay (HOSPITAL_COMMUNITY): Payer: Medicaid Other

## 2017-03-27 DIAGNOSIS — Z3A24 24 weeks gestation of pregnancy: Secondary | ICD-10-CM

## 2017-03-27 MED ORDER — ENSURE ENLIVE PO LIQD
237.0000 mL | Freq: Two times a day (BID) | ORAL | Status: DC
  Administered 2017-03-27 – 2017-05-07 (×80): 237 mL via ORAL
  Filled 2017-03-27 (×83): qty 237

## 2017-03-27 NOTE — Progress Notes (Signed)
Initial Nutrition Assessment  DOCUMENTATION CODES:   Not applicable  INTERVENTION:  Regular Diet May order double protein portions, snacks TID and from retail Ensure BID  NUTRITION DIAGNOSIS:   Increased nutrient needs related to other (see comment)(pregnancy and fetal growth requirements/ weight loss) as evidenced by other (comment)(24 weeks IUP).   GOAL:   Patient will meet greater than or equal to 90% of their needs, Weight gain   MONITOR:   Weight trends  REASON FOR ASSESSMENT:   Antenatal    ASSESSMENT:   24 weeks IUP, PROM, bleeding. Sickle cell. Weight at initial PNV, 128 lbs, BMI 20.1. Currentlt with a 10 lb overall weight loss. Ensure was ordered outpt  Pt reports no nausea, just feels full. Encouraged small freq meals, drink Ensure if skips a meal   Diet Order:  Diet regular Room service appropriate? Yes; Fluid consistency: Thin  EDUCATION NEEDS:   No education needs have been identified at this time  Skin:  Skin Assessment: Reviewed RN Assessment  Height:   Ht Readings from Last 1 Encounters:  03/20/17 5\' 7"  (1.702 m)    Weight:   Wt Readings from Last 1 Encounters:  03/27/17 118 lb (53.5 kg)    Ideal Body Weight:    135 lbs  BMI:  Body mass index is 18.48 kg/m.  Estimated Nutritional Needs:   Kcal:  1800-2000  Protein:  80-90 g  Fluid:  2.1 L    Weyman Rodney M.Fredderick Severance LDN Neonatal Nutrition Support Specialist/RD III Pager [phone]      Phone [phone]

## 2017-03-27 NOTE — Progress Notes (Signed)
Patient ID: Kelda Azad, female   DOB: Jul 07, 1970, 46 y.o.   MRN: [id number] [place] COMPREHENSIVE PROGRESS NOTE  Aaima Gaddie is a 46 y.o. [id number] at [redacted]w[redacted]d who is admitted for PPROM and sickle cell crisis.  Also has right lateral previa with asscoaited subchorionic hematoma with mild bleeding since admission and umbilical vein varix.  Fetal presentation is breech. Length of Stay:  7  Days  Subjective: Reports improved leg cramps controlled on medication.  No abdominal pain. Continued mild bleeding overnight. She is still very unhappy about having to be admitted due to PPROM due to childcare issues.   Vitals:  Blood pressure 108/67, pulse 74, temperature 98.7 F (37.1 C), temperature source Oral, resp. rate 18, height 5\' 7"  (1.702 m), weight 123 lb (55.8 kg), last menstrual period 10/10/2016, SpO2 99 %.   Physical Examination: Gen NAD Lungs Normal respiratory effort Heart Regular rate Abd soft + BS gravid  Pelvis Scant red-brown blood on pad Ext non tender  Fetal Monitoring: FHT's 150s, reassuring for GA  Labs:  No results found for this or any previous visit (from the past 24 hour(s)). CBC Latest Ref Rng & Units 03/25/2017 03/23/2017 03/20/2017  WBC 4.0 - 10.5 K/uL 17.7(H) 19.7(H) 14.6(H)  Hemoglobin 12.0 - 15.0 g/dL 9.0(L) 7.8(L) 8.9(L)  Hematocrit 36.0 - 46.0 % 26.3(L) 22.1(L) 24.5(L)  Platelets 150 - 400 K/uL 625(H) 610(H) 729(H)    Imaging Studies:     [place] Mfm Ob Follow Up  Result Date: 03/20/2017 ----------------------------------------------------------------------  OBSTETRICS REPORT                      (Signed Final 03/20/2017 10:37 am) ---------------------------------------------------------------------- Patient Info  ID #:       [id number]                          D.O.B.:  [birth year] (46 yrs)  Name:       [name]                  Visit Date: 03/20/2017 09:25 am ---------------------------------------------------------------------- Performed By   Performed By:     Hubert Azure          Secondary Phy.:   [facility]                                                             [place]  Attending:  Abram Sander MD         Address:          [facility]                                                             [place], [place]  Referred By:      Sallyanne Havers               Location:         [facility]  Ref. Address:     [place]                    [place], [place] ---------------------------------------------------------------------- Orders   #  Description                                 Code   1  [place] MFM OB FOLLOW UP                         29798.92  ----------------------------------------------------------------------   #  Ordered By               Order #        Accession #    Episode #   1  Renella Cunas            [id number]      [id number]     [id number]  ----------------------------------------------------------------------  Indications   [redacted] weeks gestation of pregnancy                Z3A.42   Advanced maternal age multigravida 79+         O51.522   (65), second trimester; quad screen + for   T21 (NML NIPS)   Poor obstetric history: Previous IUFD          O72.299   (stillbirth) @ 28wks   Poor obstetric history: Previous preterm       O09.219   delivery, antepartum   Poor obstetric history: Previous               O09.299   preeclampsia / eclampsia/gestational HTN   Abnormal biochemical screen (quad);            O28.9    elevated MSAFP (7.9 MoM) and inhibin (4.6   MoM)   Previous cesarean delivery, antepartum (X 2)   O34.219   Maternal sickle cell anemia, second trimester  O99.012, D57.1  ---------------------------------------------------------------------- OB History  Gravidity:    8         Term:   1        Prem:   3        SAB:   3  Living:       2 ---------------------------------------------------------------------- Fetal Evaluation  Num Of Fetuses:     1  Fetal Heart         171  Rate(bpm):  Cardiac Activity:   Observed  Presentation:       Breech  Placenta:           Right lateral, previa  P. Cord Insertion:  Visualized, central  Amniotic Fluid  AFI FV:      Oligohydramnios                              Largest Pocket(cm)                              0.82  Comment:    "Lynch" still noted (4.9x2.9x4.4) ---------------------------------------------------------------------- Biometry  BPD:      60.9  mm     G. Age:  24w 5d         95  %    CI:        76.62   %    70 - 86                                                          FL/HC:      18.8   %    19.2 - 20.8  HC:      220.4  mm     G. Age:  24w 0d         77  %    HC/AC:      1.03        1.05 - 1.21  AC:      214.2  mm     G. Age:  25w 6d       > 97  %    FL/BPD:     68.1   %    71 - 87  FL:  41.5  mm     G. Age:  23w 4d         55  %    FL/AC:      19.4   %    20 - 24  Est. FW:     737  gm    1 lb 10 oz      77  % ---------------------------------------------------------------------- Gestational Age  LMP:           23w 0d        Date:  10/10/16                 EDD:   07/17/17  U/S Today:     24w 4d                                        EDD:   07/06/17  Best:          23w 0d     Det. By:  LMP  (10/10/16)          EDD:   07/17/17 ---------------------------------------------------------------------- Anatomy  Cranium:               Appears normal         Aortic Arch:            Previously seen  Cavum:                 Appears normal         Ductal Arch:            Previously  seen  Ventricles:            Appears normal         Diaphragm:              Appears normal  Choroid Plexus:        Previously seen        Stomach:                Appears normal, left                                                                        sided  Cerebellum:            Previously seen        Abdomen:                Umbilical vein                                                                        varix (1cm)  Posterior Fossa:       Previously seen        Abdominal Wall:         Previously seen  Nuchal Fold:           Previously seen  Cord Vessels:           Previously seen  Face:                  Orbits and profile     Kidneys:                Appear normal                         previously seen  Lips:                  Previously seen        Bladder:                Appears normal  Thoracic:              Previously seen        Spine:                  Previously seen  Heart:                 Previously seen        Upper Extremities:      Previously seen  RVOT:                  Previously seen        Lower Extremities:      Previously seen  LVOT:                  Previously seen  Other:  Female gender previously seen.. Technically difficult due to low amniotic          fluid.Heels and 5th digit previously seen. ---------------------------------------------------------------------- Cervix Uterus Adnexa  Cervix  Length:            2.9  cm.  Appears closed, without funnelling.  Uterus  No abnormality visualized.  Left Ovary  Not visualized.  Right Ovary  Not visualized.  Adnexa:       No abnormality visualized. ---------------------------------------------------------------------- Comments  Elevated MSAFP with low-risk cfDNA and normal anatomy.  The patient reports longstanding vaginal bleeding, more  watery in character recently. ---------------------------------------------------------------------- Impression  Single living intrauterine pregnancy at 23w 0d.  Breech presentation.  Posterior, right  lateral placenta previa again seen.  Subchorionic hematoma again seen measuring  4.9x2.9x4.4cm.  New oligohydramnios with MVP 0.82cm (previously 5.35cm  on 10/04).  Normal fetal growth.  An umbilical vein varix is visualized measuring 1.0cm. No  filling defect seen on color doppler flow.  Otherwise normal interval fetal anatomy. ---------------------------------------------------------------------- Recommendations  Concern for PPROM due to significant decrease in amniotic  fluid volume, watery vaginal bleeding, and normal fetal growth  (placental insufficiency less likely).  Discussed new oligohydramios, concern for PPROM, and  new finding of umbilical vein varix with the patient.  Recommend presentation to MAU for further evaluation.  Findings discussed with Dr. Ilda Basset.  Recommend weekly ultrasound for doppler assessment of  umbilical vein varix until 28 weeks, twice weekly thereafter if  undelivered.  Recommend follow-up ultrasound examination in 4 weeks for  reassessment of fetal growth. ----------------------------------------------------------------------                   Abram Sander, MD Electronically Signed Final Report   03/20/2017 10:37 am ----------------------------------------------------------------------  [place] Mfm Ob Limited  Result Date: 03/27/2017 ----------------------------------------------------------------------  OBSTETRICS REPORT                      (  Signed Final 03/27/2017 09:02 am) ---------------------------------------------------------------------- Patient Info  ID #:       [id number]                          D.O.B.:  2071-05-03 (46 yrs)  Name:       [name]                  Visit Date: 03/27/2017 07:33 am ---------------------------------------------------------------------- Performed By  Performed By:     Jeanene Erb BS,      Secondary Phy.:   [facility]                                                              [place]  Attending:        Griffin Dakin MD         Address:          [facility]                                                             [place], [place]  [address]  Referred By:      Ward Givens Phy.:    [facility] [facility]                    ANYANWU MD                                                             OB                                                             3rd Floor  Ref. Address:     [address]       Location:         [facility]                    [place], [place] ---------------------------------------------------------------------- Orders   #  Description                                 Code   1  [place] MFM OB LIMITED                           69629.52  ----------------------------------------------------------------------   #  Ordered By               Order #        Accession #    Episode #   1  [name]           [id number]      [id number]     [id number]  ---------------------------------------------------------------------- Indications   [redacted] weeks gestation of pregnancy                Z3A.43   Advanced maternal age multigravida 20+         O3.522   (46), second trimester; quad screen + for   T21 (NML NIPS)   Poor obstetric history: Previous IUFD          O50.299   (stillbirth) @ 28wks   Poor obstetric history: Previous preterm       O09.219   delivery, antepartum   Poor obstetric history: Previous               O09.299   preeclampsia / eclampsia/gestational HTN   Abnormal biochemical screen (quad);            O28.9   elevated MSAFP (7.9 MoM)  and inhibin (4.6   MoM)   Previous cesarean delivery, antepartum (X 2)   O34.219   Maternal sickle cell anemia, second trimester  G38.756, E33.2   Umbilical vein abnormality complicating        R51.9XX0   pregnancy  Placenta previa with hemorrhage, second        O44.12   trimester   Subchorionic hemorrhage, antepartum            O45.90   Premature rupture of membranes - leaking       O42.90   fluid  ---------------------------------------------------------------------- OB History  Blood Type:            Height:  5'7"   Weight (lb):  123       BMI:  19.26  Gravidity:    8         Term:   1        Prem:   3        SAB:   3  Living:       2 ---------------------------------------------------------------------- Fetal Evaluation  Num Of Fetuses:     1  Fetal Heart         143  Rate(bpm):  Cardiac Activity:   Observed  Presentation:       Breech  Placenta:           Right lateral, previa  P. Cord Insertion:  Previously Visualized  Amniotic Fluid  AFI FV:      Oligohydramnios                              Largest Pocket(cm)                              2.7  Comment:    Subchorionic hemorrhage noted. ---------------------------------------------------------------------- Gestational Age  LMP:           24w 0d        Date:  10/10/16                 EDD:   07/17/17  Best:          Sharmon Leyden 0d     Det. By:  LMP  (10/10/16)          EDD:   07/17/17 ---------------------------------------------------------------------- Anatomy  Stomach:               Appears normal, left   Kidneys:                Appear normal                         sided  Abdomen:               Umbilical vein         Bladder:                Appears normal                         varix 0.8cm ---------------------------------------------------------------------- Cervix Uterus Adnexa  Cervix  Length:            3.5  cm.  Normal appearance by transabdominal scan. ---------------------------------------------------------------------- Impression  Single living  intrauterine pregnancy at 24+1 weeks  Normal fetal cardiac activity  Breech presentation.  Posterior, right lateral placenta previa again seen.  Subchorionic hematoma again seen, essentially unchanged  from last week's scan  Oligohydramnios is again noted  The umbilical vein varix is visualized measuring 47mm. No  filling defect seen on color doppler flow. ---------------------------------------------------------------------- Recommendations  There may be some thickening of the ventricular  myocardium; optimal views were not obtained today.  Continue weekly scans ----------------------------------------------------------------------                 Griffin Dakin, MD Electronically Signed Final Report   03/27/2017 09:02 am ----------------------------------------------------------------------  Medications:  Scheduled . amoxicillin  500 mg Oral Q8H  . aspirin EC  81 mg Oral Daily  . docusate sodium  100 mg Oral BID  . enoxaparin (LOVENOX) injection  40 mg Subcutaneous Q24H  . folic acid  5 mg Oral Daily  . oxyCODONE  10 mg Oral Q4H  . prenatal multivitamin  1 tablet Oral Q1200  . sodium chloride flush  3 mL Intravenous Q12H   I have reviewed the patient's current medications.  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 Umbilical artery dopplers weekly for umbilical vein varix already  ordered Continue pain regimen as per Sickle Cell service, appreciate their input.  Continue to monitor bleeding closely Continue routine antenatal care.   [name], MD 03/27/2017,9:22 AM

## 2017-03-28 DIAGNOSIS — L97919 Non-pressure chronic ulcer of unspecified part of right lower leg with unspecified severity: Secondary | ICD-10-CM

## 2017-03-28 MED ORDER — RESTA SILVER EX GEL
1.0000 "application " | Freq: Every day | CUTANEOUS | Status: DC | PRN
  Administered 2017-03-28 – 2017-04-12 (×6): 1 via CUTANEOUS
  Filled 2017-03-28: qty 1

## 2017-03-28 NOTE — Plan of Care (Signed)
Wound care discussed with patient.  Skin care provided.

## 2017-03-28 NOTE — Progress Notes (Signed)
Neonatology Note:  Prenatal consult done 11/1 at 23 0/[redacted] weeks GA by Dr. Katherina Mires.  I was reconsulted today she has reached 24 weeks. I met with [name] and reaffirmed the information which Dr. Katherina Mires had given previously with updated information regarding prognosis at 24 weeks versus 23 weeks. She did not have any questions.  She is hopeful to make it to a greater gestation age prior to delivery and requests all resuscitative efforts.  Thank you for allowing [place] to participate in her care.  Please call with questions.  Higinio Roger, DO  Neonatologist

## 2017-03-28 NOTE — Progress Notes (Signed)
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 Hb SS with Crisis: Will continue current oral pain regimen of scheduled Oxycodone and Dilaudid IV q 3 hours PRN breakthrough pain.  2. Anemia of Chronic Disease: Recommend checking Hb tomorrow.   3. Leukocytosis: No evidence of infection.Related to crisis. Pt has an elevated WBC at baseline.  4. Leg Ulcer: Pt has been refusing dressing recommended by WOC. I spoke with her again today and she is now willing to accept the dressing. Nurse made aware.  5. Hypokalemia: resolved.  6. PROM: Defer to OB. Per patient the plan is for her to remain hospitalized until the baby is delivered.   I will sign off at this time. If further questions arise please do not hesitate to call. Thanks for the consult.    Code Status: Full Code Family Communication: N/A Disposition Plan: Not yet ready for discharge  [place].  Pager [phone]. If 7PM-7AM, please contact night-coverage.  03/28/2017, 12:46 PM  LOS: 8 days   Interim History: Pt reports that her pain is controlled except for the occasional wrist pain. She feels that the current pain regimen is working well for her.   Objective: Vitals:   03/28/17 0014 03/28/17 0431 03/28/17 0900 03/28/17 1200  BP: 100/60 116/78 116/64 122/72  Pulse: 80 73  86 87  Resp: 18 17 18 18   Temp: 98.1 F (36.7 C) 98.6 F (37 C) 97.8 F (36.6 C) 98.3 F (36.8 C)  TempSrc: Oral Oral Oral Oral  SpO2: 96% 98% 97% 98%  Weight:      Height:       Weight change:  No intake or output data in the 24 hours ending 03/28/17 1246   Physical Exam General: Alert, awake, oriented x3, well appearing and smiling this morning.  HEENT: /AT PEERL, EOMI, anicteric Heart: Regular rate and rhythm, without murmurs, rubs, gallops, PMI non-displaced, no heaves or thrills on palpation.  Lungs: Clear to auscultation, no wheezing or rhonchi noted. No increased vocal fremitus resonant to percussion  Abdomen: Gravid Neuro: No focal neurological deficits noted cranial nerves II through XII grossly intact. Strength at baseline in bilateral upper and lower extremities. Musculoskeletal: No warmth swelling or erythema around joints, no spinal tenderness noted. Psychiatric: Patient alert and oriented x3, good insight and cognition, good recent to remote recall.   Data Reviewed: Basic Metabolic Panel: Recent Labs  Lab 03/23/17 0509 03/25/17 1005  NA 133* 136  K 3.7 3.7  CL 104 105  CO2 23 24  GLUCOSE 85 114*  BUN <5* <5*  CREATININE <0.30* 0.32*  CALCIUM 9.3 9.4   Liver Function Tests: Recent Labs  Lab 03/23/17 0509  AST 44*  ALT 19  ALKPHOS 107  BILITOT 2.9*  PROT 6.2*  ALBUMIN 2.5*   No results for input(s): LIPASE, AMYLASE in the last 168 hours. No results for input(s): AMMONIA in the  last 168 hours. CBC: Recent Labs  Lab 03/23/17 0509 03/25/17 1005  WBC 19.7* 17.7*  NEUTROABS  --  12.9*  HGB 7.8* 9.0*  HCT 22.1* 26.3*  MCV 84.0 86.5  PLT 610* 625*   Cardiac Enzymes: No results for input(s): CKTOTAL, CKMB, CKMBINDEX, TROPONINI in the last 168 hours. BNP (last 3 results) Recent Labs    08/15/16 2007  BNP 48.3    ProBNP (last 3 results) No results for input(s): PROBNP in the last 8760 hours.  CBG: No results for input(s): GLUCAP in  the last 168 hours.  Recent Results (from the past 240 hour(s))  Culture, OB Urine     Status: Abnormal   Collection Time: 03/20/17 11:00 AM  Result Value Ref Range Status   Specimen Description URINE, CLEAN CATCH  Final   Special Requests NONE  Final   Culture (A)  Final    MULTIPLE SPECIES PRESENT, SUGGEST RECOLLECTION NO GROUP B STREP (S.AGALACTIAE) ISOLATED Performed at [facility], [address], [place], [place] [address]    Report Status 03/21/2017 FINAL  Final     Studies: [place] Mfm Ob Follow Up  Result Date: 03/20/2017 ----------------------------------------------------------------------  OBSTETRICS REPORT                      (Signed Final 03/20/2017 10:37 am) ---------------------------------------------------------------------- Patient Info  ID #:       [id number]                          D.O.B.:  2071/03/16 (46 yrs)  Name:       [name]                  Visit Date: 03/20/2017 09:25 am ---------------------------------------------------------------------- Performed By  Performed By:     Hubert Azure          Secondary Phy.:   [facility]                                                             [place]  Attending:        Abram Sander MD         Address:          [facility]                                                             [place], [place]  Referred By:      Sallyanne Havers               Location:         [facility] MD  Ref. Address:     [place]                    [place], [place] ---------------------------------------------------------------------- Orders   #  Description                                 Code   1  [place] MFM OB FOLLOW UP                         63149.70  ----------------------------------------------------------------------   #  Ordered By               Order #        Accession #    Episode #   1  Renella Cunas            [id number]      [id number]     [id number]  ---------------------------------------------------------------------- Indications   [redacted] weeks gestation of pregnancy                Z3A.102   Advanced maternal age multigravida 3+         O79.522   (23), second trimester; quad screen + for   T21 (NML NIPS)   Poor obstetric history: Previous IUFD          O7.299   (stillbirth) @ 28wks   Poor obstetric  history: Previous preterm       O09.219   delivery, antepartum   Poor obstetric history: Previous               O09.299   preeclampsia / eclampsia/gestational HTN   Abnormal biochemical screen (quad);            O28.9   elevated MSAFP (7.9 MoM) and inhibin (4.6   MoM)   Previous cesarean delivery, antepartum (X 2)   O34.219   Maternal sickle cell anemia, second trimester  O99.012, D57.1  ---------------------------------------------------------------------- OB History  Gravidity:    8         Term:   1        Prem:   3        SAB:   3  Living:       2 ---------------------------------------------------------------------- Fetal Evaluation  Num Of Fetuses:     1  Fetal Heart         171  Rate(bpm):  Cardiac Activity:   Observed  Presentation:       Breech  Placenta:           Right lateral, previa  P. Cord Insertion:  Visualized, central  Amniotic Fluid  AFI FV:      Oligohydramnios                              Largest Pocket(cm)                              0.82  Comment:    "[place]" still noted (4.9x2.9x4.4) ---------------------------------------------------------------------- Biometry  BPD:      60.9  mm     G. Age:  24w 5d          95  %    CI:        76.62   %    70 - 86                                                          FL/HC:      18.8   %    19.2 - 20.8  HC:      220.4  mm     G. Age:  24w 0d         77  %    HC/AC:      1.03        1.05 - 1.21  AC:      214.2  mm     G. Age:  25w 6d       > 97  %    FL/BPD:     68.1   %    71 - 87  FL:       41.5  mm     G. Age:  23w 4d         55  %    FL/AC:      19.4   %    20 - 24  Est. FW:     737  gm    1 lb 10 oz      77  % ---------------------------------------------------------------------- Gestational Age  LMP:  23w 0d        Date:  10/10/16                 EDD:   07/17/17  U/S Today:     24w 4d                                        EDD:   07/06/17  Best:          23w 0d     Det. By:  LMP  (10/10/16)          EDD:   07/17/17 ---------------------------------------------------------------------- Anatomy  Cranium:               Appears normal         Aortic Arch:            Previously seen  Cavum:                 Appears normal         Ductal Arch:            Previously seen  Ventricles:            Appears normal         Diaphragm:              Appears normal  Choroid Plexus:        Previously seen        Stomach:                Appears normal, left                                                                        sided  Cerebellum:            Previously seen        Abdomen:                Umbilical vein                                                                        varix (1cm)  Posterior Fossa:       Previously seen        Abdominal Wall:         Previously seen  Nuchal Fold:           Previously seen        Cord Vessels:           Previously seen  Face:                  Orbits and profile     Kidneys:                Appear normal  previously seen  Lips:                  Previously seen        Bladder:                Appears normal  Thoracic:              Previously seen        Spine:                  Previously seen  Heart:                  Previously seen        Upper Extremities:      Previously seen  RVOT:                  Previously seen        Lower Extremities:      Previously seen  LVOT:                  Previously seen  Other:  Female gender previously seen.. Technically difficult due to low amniotic          fluid.Heels and 5th digit previously seen. ---------------------------------------------------------------------- Cervix Uterus Adnexa  Cervix  Length:            2.9  cm.  Appears closed, without funnelling.  Uterus  No abnormality visualized.  Left Ovary  Not visualized.  Right Ovary  Not visualized.  Adnexa:       No abnormality visualized. ---------------------------------------------------------------------- Comments  Elevated MSAFP with low-risk cfDNA and normal anatomy.  The patient reports longstanding vaginal bleeding, more  watery in character recently. ---------------------------------------------------------------------- Impression  Single living intrauterine pregnancy at 23w 0d.  Breech presentation.  Posterior, right lateral placenta previa again seen.  Subchorionic hematoma again seen measuring  4.9x2.9x4.4cm.  New oligohydramnios with MVP 0.82cm (previously 5.35cm  on 10/04).  Normal fetal growth.  An umbilical vein varix is visualized measuring 1.0cm. No  filling defect seen on color doppler flow.  Otherwise normal interval fetal anatomy. ---------------------------------------------------------------------- Recommendations  Concern for PPROM due to significant decrease in amniotic  fluid volume, watery vaginal bleeding, and normal fetal growth  (placental insufficiency less likely).  Discussed new oligohydramios, concern for PPROM, and  new finding of umbilical vein varix with the patient.  Recommend presentation to MAU for further evaluation.  Findings discussed with Dr. Ilda Basset.  Recommend weekly ultrasound for doppler assessment of  umbilical vein varix until 28 weeks, twice weekly thereafter if  undelivered.  Recommend  follow-up ultrasound examination in 4 weeks for  reassessment of fetal growth. ----------------------------------------------------------------------                   Abram Sander, MD Electronically Signed Final Report   03/20/2017 10:37 am ----------------------------------------------------------------------  [place] Mfm Ob Limited  Result Date: 03/27/2017 ----------------------------------------------------------------------  OBSTETRICS REPORT                      (Signed Final 03/27/2017 09:02 am) ---------------------------------------------------------------------- Patient Info  ID #:       [id number]                          D.O.B.:  2071-03-10 (46 yrs)  Name:       [name]                  Visit  Date: 03/27/2017 07:33 am ---------------------------------------------------------------------- Performed By  Performed By:     Jeanene Erb BS,      Secondary Phy.:   [name] [facility]                                                             [facility]                                                             Healthcare  Attending:        Griffin Dakin MD         Address:          [facility]                                                             [place], [place]  Referred By:      Ward Givens Phy.:  [place] MD                                                             OB                                                             3rd Floor  Ref. Address:     [address]       Location:         [facility]                    [place], [place] ---------------------------------------------------------------------- Orders   #  Description                                 Code   1  [place] MFM OB LIMITED                           78295.62  ----------------------------------------------------------------------   #  Ordered By               Order #        Accession #    Episode #   1  [name]           [id number]      [id number]     [id number]  ---------------------------------------------------------------------- Indications   [redacted] weeks gestation of pregnancy                Z3A.48   Advanced maternal age multigravida 18+         O30.522   (39), second trimester; quad screen + for   T21 (NML NIPS)   Poor obstetric history: Previous IUFD          O6.299   (stillbirth) @ 28wks   Poor obstetric history: Previous preterm       O09.219   delivery, antepartum   Poor obstetric history: Previous               O09.299   preeclampsia / eclampsia/gestational HTN   Abnormal biochemical screen (quad);            O28.9   elevated MSAFP (7.9 MoM) and inhibin (4.6   MoM)   Previous cesarean delivery, antepartum (X 2)   O34.219   Maternal sickle cell anemia, second trimester  O53.664, Q03.4   Umbilical vein abnormality complicating        V42.9XX0   pregnancy   Placenta previa with hemorrhage, second        O44.12   trimester   Subchorionic hemorrhage, antepartum  O45.90   Premature rupture of membranes - leaking       O42.90   fluid  ---------------------------------------------------------------------- OB History  Blood Type:            Height:  5'7"   Weight (lb):  123       BMI:  19.26  Gravidity:    8         Term:   1        Prem:   3        SAB:   3  Living:       2 ---------------------------------------------------------------------- Fetal Evaluation  Num Of Fetuses:     1  Fetal Heart         143  Rate(bpm):  Cardiac Activity:   Observed  Presentation:       Breech  Placenta:           Right lateral, previa  P. Cord Insertion:  Previously  Visualized  Amniotic Fluid  AFI FV:      Oligohydramnios                              Largest Pocket(cm)                              2.7  Comment:    Subchorionic hemorrhage noted. ---------------------------------------------------------------------- Gestational Age  LMP:           24w 0d        Date:  10/10/16                 EDD:   07/17/17  Best:          Sharmon Leyden 0d     Det. By:  LMP  (10/10/16)          EDD:   07/17/17 ---------------------------------------------------------------------- Anatomy  Stomach:               Appears normal, left   Kidneys:                Appear normal                         sided  Abdomen:               Umbilical vein         Bladder:                Appears normal                         varix 0.8cm ---------------------------------------------------------------------- Cervix Uterus Adnexa  Cervix  Length:            3.5  cm.  Normal appearance by transabdominal scan. ---------------------------------------------------------------------- Impression  Single living intrauterine pregnancy at 24+1 weeks  Normal fetal cardiac activity  Breech presentation.  Posterior, right lateral placenta previa again seen.  Subchorionic hematoma again seen, essentially unchanged  from last week's scan  Oligohydramnios is again noted  The umbilical vein varix is visualized measuring 86mm. No  filling defect seen on color doppler flow. ---------------------------------------------------------------------- Recommendations  There may be some thickening of the ventricular  myocardium; optimal views were not obtained today.  Continue weekly scans ----------------------------------------------------------------------                 Griffin Dakin, MD  Electronically Signed Final Report   03/27/2017 09:02 am ----------------------------------------------------------------------   Scheduled Meds: . aspirin EC  81 mg Oral Daily  . docusate sodium  100 mg Oral BID  . enoxaparin (LOVENOX) injection  40 mg  Subcutaneous Q24H  . feeding supplement (ENSURE ENLIVE)  237 mL Oral BID BM  . folic acid  5 mg Oral Daily  . oxyCODONE  10 mg Oral Q4H  . prenatal multivitamin  1 tablet Oral Q1200  . 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 Greater than 50% involved face to face contact with the patient for assessment, counseling and coordination of care.

## 2017-03-28 NOTE — Progress Notes (Signed)
CSW met with patient to follow up on psychosocial stressors and offer support.  Patient appeared to be in good spirits, but seems discouraged that CSW cannot offer someone to care for her children while she is in the hospital and to care for herself after she has her c-section.  CSW asked who cared for her after her first two c-sections and she said, "no one."  CSW informed patient that after speaking with the [place], CSW learned that there is no additional documentation needed for someone from [place] to travel here for up to 90 days.  Patient then said, "she can't come anymore.  She thought it would only be for 2-3 weeks."  CSW suggests that 2-3 weeks of assistance is better than none.  She replied, "that's not good for me."  She then told CSW that she wants her aunt to come from [place].  CSW asked patient to get her aunt's information and offered to contact the [place] in [place].  CSW agreed to return Monday to follow up with patient.   CSW informed patient that after contacting the [employer] school social worker, [place] learned that patient's daughter had not been missing school, as patient reported earlier in the week.  Patient confirmed and now told CSW that her husband has found someone to watch her in the afternoons when she gets off the bus, but that this person states she is not going to do it much longer.  CSW asked when the person said she would no longer be able to assist, and she said "when we find someone else."  CSW informed patient that [employer] has an Paediatric nurse and that it has openings, per school Education officer, museum.  CSW explained that her daughter would take a bus to a nearby elementary school and then would need to be picked up by 6pm.  Patient told CSW that her husband states this will not work for him.   CSW asked patient to let CSW know if there is any way she can identify that CSW can support/assist her and validated her emotions related to this difficult time.

## 2017-03-28 NOTE — Progress Notes (Signed)
Patient ID: [name], female   DOB: 05/19/1971, 46 y.o.   MRN: [id number] [place] COMPREHENSIVE PROGRESS NOTE  [name] is a 46 y.o. [id number] at [redacted]w[redacted]d who is admitted for PPROM and SS crisis.  Also has right lateral previa with associaited subchorionic hematoma with mild bleeding since admission and umbilical vein varix. Fetal presentation is breech. Length of Stay:  8  Days  Subjective: Complaining of severe leg pain, just had some Dilaudid. No abdominal pain. Continued mild bleeding overnight.  Vitals:  Blood pressure 116/78, pulse 73, temperature 98.6 F (37 C), temperature source Oral, resp. rate 17, height 5\' 7"  (1.702 m), weight 118 lb (53.5 kg), last menstrual period 10/10/2016, SpO2 98 %.   Physical Examination: Gen NAD Lungs Normal respiratory effort Heart Regular rate Abd soft + BS gravid  Pelvis Scant red-brown blood on pad Ext non tender  Fetal Monitoring: FHT's 150s, reassuring for GA  Labs:  No results found for this or any previous visit (from the past 24 hour(s)). CBC Latest Ref Rng & Units 03/25/2017 03/23/2017 03/20/2017  WBC 4.0 - 10.5 K/uL 17.7(H) 19.7(H) 14.6(H)  Hemoglobin 12.0 - 15.0 g/dL 9.0(L) 7.8(L) 8.9(L)  Hematocrit 36.0 - 46.0 % 26.3(L) 22.1(L) 24.5(L)  Platelets 150 - 400 K/uL 625(H) 610(H) 729(H)    Imaging Studies:     [place] Mfm Ob Follow Up  Result Date: 03/20/2017 ----------------------------------------------------------------------  OBSTETRICS REPORT                      (Signed Final 03/20/2017 10:37 am) ---------------------------------------------------------------------- Patient Info  ID #:       [id number]                          D.O.B.:  2070/08/30 (46 yrs)  Name:       [name]                  Visit Date: 03/20/2017 09:25 am ---------------------------------------------------------------------- Performed By  Performed By:     Hubert Azure          Secondary Phy.:   [place] [facility]                                                             [place]  Attending:        Abram Sander MD         Address:  [facility]                                                             [place], [place]  Referred By:      Sallyanne Havers               Location:         [facility] MD  Ref. Address:     [place], [place] ---------------------------------------------------------------------- Orders   #  Description                                 Code   1  [place] MFM OB FOLLOW UP                         40981.19  ----------------------------------------------------------------------   #  Ordered By               Order #        Accession #    Episode #   1  Renella Cunas            [id number]      [id number]     [id number]  ---------------------------------------------------------------------- Indications   [redacted] weeks gestation of pregnancy  Z3A.23   Advanced maternal age multigravida 62+         O31.522   (46), second trimester; quad screen + for   T21 (NML NIPS)   Poor obstetric history: Previous IUFD          O26.299   (stillbirth) @ 28wks   Poor obstetric history: Previous preterm       O09.219   delivery, antepartum   Poor obstetric history: Previous               O09.299   preeclampsia / eclampsia/gestational HTN   Abnormal biochemical screen (quad);            O28.9   elevated MSAFP (7.9 MoM) and inhibin (4.6   MoM)   Previous cesarean delivery, antepartum (X 2)   O34.219    Maternal sickle cell anemia, second trimester  O99.012, D57.1  ---------------------------------------------------------------------- OB History  Gravidity:    8         Term:   1        Prem:   3        SAB:   3  Living:       2 ---------------------------------------------------------------------- Fetal Evaluation  Num Of Fetuses:     1  Fetal Heart         171  Rate(bpm):  Cardiac Activity:   Observed  Presentation:       Breech  Placenta:           Right lateral, previa  P. Cord Insertion:  Visualized, central  Amniotic Fluid  AFI FV:      Oligohydramnios                              Largest Pocket(cm)                              0.82  Comment:    "[place]" still noted (4.9x2.9x4.4) ---------------------------------------------------------------------- Biometry  BPD:      60.9  mm     G. Age:  24w 5d         95  %    CI:        76.62   %    70 - 86                                                          FL/HC:      18.8   %    19.2 - 20.8  HC:      220.4  mm     G. Age:  24w 0d         77  %    HC/AC:      1.03        1.05 - 1.21  AC:      214.2  mm     G. Age:  25w 6d       > 97  %    FL/BPD:     68.1   %    71 - 87  FL:       41.5  mm     G. Age:  23w 4d  55  %    FL/AC:      19.4   %    20 - 24  Est. FW:     737  gm    1 lb 10 oz      77  % ---------------------------------------------------------------------- Gestational Age  LMP:           23w 0d        Date:  10/10/16                 EDD:   07/17/17  U/S Today:     24w 4d                                        EDD:   07/06/17  Best:          23w 0d     Det. By:  LMP  (10/10/16)          EDD:   07/17/17 ---------------------------------------------------------------------- Anatomy  Cranium:               Appears normal         Aortic Arch:            Previously seen  Cavum:                 Appears normal         Ductal Arch:            Previously seen  Ventricles:            Appears normal         Diaphragm:              Appears normal  Choroid Plexus:         Previously seen        Stomach:                Appears normal, left                                                                        sided  Cerebellum:            Previously seen        Abdomen:                Umbilical vein                                                                        varix (1cm)  Posterior Fossa:       Previously seen        Abdominal Wall:         Previously seen  Nuchal Fold:           Previously seen        Cord Vessels:           Previously seen  Face:  Orbits and profile     Kidneys:                Appear normal                         previously seen  Lips:                  Previously seen        Bladder:                Appears normal  Thoracic:              Previously seen        Spine:                  Previously seen  Heart:                 Previously seen        Upper Extremities:      Previously seen  RVOT:                  Previously seen        Lower Extremities:      Previously seen  LVOT:                  Previously seen  Other:  Female gender previously seen.. Technically difficult due to low amniotic          fluid.Heels and 5th digit previously seen. ---------------------------------------------------------------------- Cervix Uterus Adnexa  Cervix  Length:            2.9  cm.  Appears closed, without funnelling.  Uterus  No abnormality visualized.  Left Ovary  Not visualized.  Right Ovary  Not visualized.  Adnexa:       No abnormality visualized. ---------------------------------------------------------------------- Comments  Elevated MSAFP with low-risk cfDNA and normal anatomy.  The patient reports longstanding vaginal bleeding, more  watery in character recently. ---------------------------------------------------------------------- Impression  Single living intrauterine pregnancy at 23w 0d.  Breech presentation.  Posterior, right lateral placenta previa again seen.  Subchorionic hematoma again seen measuring  4.9x2.9x4.4cm.  New  oligohydramnios with MVP 0.82cm (previously 5.35cm  on 10/04).  Normal fetal growth.  An umbilical vein varix is visualized measuring 1.0cm. No  filling defect seen on color doppler flow.  Otherwise normal interval fetal anatomy. ---------------------------------------------------------------------- Recommendations  Concern for PPROM due to significant decrease in amniotic  fluid volume, watery vaginal bleeding, and normal fetal growth  (placental insufficiency less likely).  Discussed new oligohydramios, concern for PPROM, and  new finding of umbilical vein varix with the patient.  Recommend presentation to MAU for further evaluation.  Findings discussed with Dr. Ilda Basset.  Recommend weekly ultrasound for doppler assessment of  umbilical vein varix until 28 weeks, twice weekly thereafter if  undelivered.  Recommend follow-up ultrasound examination in 4 weeks for  reassessment of fetal growth. ----------------------------------------------------------------------                   Abram Sander, MD Electronically Signed Final Report   03/20/2017 10:37 am ----------------------------------------------------------------------  [place] Mfm Ob Limited  Result Date: 03/27/2017 ----------------------------------------------------------------------  OBSTETRICS REPORT                      (Signed Final 03/27/2017 09:02 am) ---------------------------------------------------------------------- Patient Info  ID #:       [id number]  D.O.B.:  2070-12-22 (46 yrs)  Name:       MAME TWOMBLY                  Visit Date: 03/27/2017 07:33 am ---------------------------------------------------------------------- Performed By  Performed By:     Jeanene Erb BS,      Secondary Phy.:   [place] for                                                             [place]  Attending:        Griffin Dakin MD         Address:          [facility]                                                             [place], [place]  [address]  Referred By:      Ward Givens Phy.:    3[employer] Nursing- [employer]                    ANYANWU MD                                                             OB                                                             3rd Floor  Ref. Address:     [address]       Location:         [facility]                    [place], [place] ---------------------------------------------------------------------- Orders   #  Description                                 Code   1  [place] MFM OB LIMITED                           78295.62  ----------------------------------------------------------------------   #  Ordered By               Order #        Accession #    Episode #   1  [name]           [id number]      [id number]     [id number]  ---------------------------------------------------------------------- Indications   [redacted] weeks gestation of pregnancy                Z3A.60   Advanced maternal age multigravida 23+         O97.522   (27), second trimester; quad screen + for   T21 (NML NIPS)   Poor obstetric history: Previous IUFD          O13.299   (stillbirth) @ 28wks   Poor obstetric history: Previous preterm       O09.219   delivery, antepartum   Poor obstetric history: Previous               O09.299   preeclampsia / eclampsia/gestational HTN   Abnormal biochemical screen (quad);            O28.9   elevated MSAFP (7.9 MoM) and inhibin (4.6   MoM)   Previous cesarean delivery, antepartum (X 2)   O34.219   Maternal sickle  cell anemia, second trimester  O53.664, Q03.4   Umbilical vein abnormality complicating        V42.9XX0   pregnancy  Placenta previa with hemorrhage, second        O44.12   trimester   Subchorionic hemorrhage, antepartum            O45.90   Premature rupture of membranes - leaking       O42.90   fluid  ---------------------------------------------------------------------- OB History  Blood Type:            Height:  5'7"   Weight (lb):  123       BMI:  19.26  Gravidity:    8         Term:   1        Prem:   3        SAB:   3  Living:       2 ---------------------------------------------------------------------- Fetal Evaluation  Num Of Fetuses:     1  Fetal Heart         143  Rate(bpm):  Cardiac Activity:   Observed  Presentation:       Breech  Placenta:           Right lateral, previa  P. Cord Insertion:  Previously Visualized  Amniotic Fluid  AFI FV:      Oligohydramnios                              Largest Pocket(cm)                              2.7  Comment:    Subchorionic hemorrhage noted. ---------------------------------------------------------------------- Gestational Age  LMP:           24w 0d        Date:  10/10/16                 EDD:   07/17/17  Best:          Sharmon [place] 0d     Det. By:  LMP  (10/10/16)          EDD:   07/17/17 ---------------------------------------------------------------------- Anatomy  Stomach:               Appears normal, left   Kidneys:                Appear normal                         sided  Abdomen:               Umbilical vein         Bladder:                Appears normal                         varix 0.8cm ---------------------------------------------------------------------- Cervix Uterus Adnexa  Cervix  Length:            3.5  cm.  Normal appearance by transabdominal scan. ---------------------------------------------------------------------- Impression  Single living intrauterine pregnancy at 24+1 weeks  Normal fetal cardiac activity  Breech presentation.  Posterior, right  lateral placenta previa again seen.  Subchorionic hematoma again seen, essentially unchanged  from last week's scan  Oligohydramnios is again noted  The umbilical vein varix is visualized measuring 87mm. No  filling defect seen on color doppler flow. ---------------------------------------------------------------------- Recommendations  There may be some thickening of the ventricular  myocardium; optimal views were not obtained today.  Continue weekly scans ----------------------------------------------------------------------                 Griffin Dakin, MD Electronically Signed Final Report   03/27/2017 09:02 am ----------------------------------------------------------------------  Medications:  Scheduled . aspirin EC  81 mg Oral Daily  . docusate sodium  100 mg Oral BID  . enoxaparin (LOVENOX) injection  40 mg Subcutaneous Q24H  . feeding supplement (ENSURE ENLIVE)  237 mL Oral BID BM  . folic acid  5 mg Oral Daily  . oxyCODONE  10 mg Oral Q4H  . prenatal multivitamin  1 tablet Oral Q1200  . sodium chloride flush  3 mL Intravenous Q12H   I have reviewed the patient's current medications.  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 Neonatalogy reconsulted today; talked to Dr. Higinio Roger. Continue umbilical artery dopplers weekly for umbilical vein  varix Continue pain regimen as per Sickle Cell service, really appreciate their input.  Continue to monitor bleeding closely. Continue routine antenatal care.   [name], MD 03/28/2017,9:18 AM

## 2017-03-29 DIAGNOSIS — O42112 Preterm premature rupture of membranes, onset of labor more than 24 hours following rupture, second trimester: Principal | ICD-10-CM

## 2017-03-29 NOTE — Progress Notes (Signed)
Baby very active at this time; hard to trace on monitor from 1513 to 1524.

## 2017-03-29 NOTE — Progress Notes (Signed)
Patient ID: [name], female   DOB: 1970/09/14, 46 y.o.   MRN: [id number]  [place]) NOTE   [name] is a 46 y.o. [id number] at [redacted]w[redacted]d who is admitted for PPROM and SS crisis.  Also has right lateral previa with associaited subchorionic hematoma with mild bleeding since admission and umbilical vein varix. Fetal presentation is breech  Length of Stay:  9  Days  Subjective: Leg pain is improving Patient reports the fetal movement as active. Patient reports uterine contraction  activity as none. Patient reports  vaginal bleeding as scant staining. Patient describes fluid per vagina as Other blood stained.  Vitals:  Blood pressure 112/64, pulse 76, temperature 97.9 F (36.6 C), temperature source Oral, resp. rate 17, height 5\' 7"  (1.702 m), weight 118 lb (53.5 kg), last menstrual period 10/10/2016, SpO2 97 %. Physical Examination:  General appearance - alert, well appearing, and in no distress Heart - normal rate and regular rhythm Abdomen - soft, nontender, nondistended Fundal Height:  size equals dates Cervical Exam: Not evaluated. . Extremities: extremities normal, atraumatic, no cyanosis or edema and Homans sign is negative, no sign of DVT Membranes:ruptured  Fetal Monitoring:  Fetal Heart Rate A  Mode External filed at 03/28/2017 1745  Baseline Rate (A) 155 bpm filed at 03/28/2017 1745  Variability 6-25 BPM filed at 03/28/2017 1745  Accelerations 10 x 10 filed at 03/28/2017 1745  Decelerations None filed at 03/28/2017 1745     Labs:  No results found for this or any previous visit (from the past 24 hour(s)).    Medications:  Scheduled . aspirin EC  81 mg Oral Daily  . docusate sodium  100 mg Oral BID  . enoxaparin (LOVENOX) injection  40 mg Subcutaneous Q24H  . feeding supplement (ENSURE ENLIVE)  237 mL Oral BID BM  . folic acid  5 mg Oral Daily  . oxyCODONE  10 mg Oral Q4H  . prenatal multivitamin  1 tablet Oral Q1200  .  sodium chloride flush  3 mL Intravenous Q12H   I have reviewed the patient's current medications.  ASSESSMENT: Patient Active Problem List   Diagnosis Date Noted  . Ulcer of right lower extremity ([place])   . Preterm premature rupture of membranes (PPROM)  in second trimester, antepartum 03/20/2017  . History of preterm delivery, currently pregnant in second trimester 03/20/2017  . Umbilical vein abnormality affecting pregnancy (varix) 03/20/2017  . 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 Partial placenta previa - right lateral previa 02/21/2017  . Abnormal MSAFP (maternal serum alpha-fetoprotein), elevated 02/18/2017  . Subchorionic hematoma in second trimester 12/23/2016  . Supervision of high risk pregnancy, antepartum 12/16/2016  . History of pre-eclampsia 12/16/2016  . Sickle cell anemia of mother during pregnancy ([place]) 11/09/2016  . Advanced maternal age in multigravida 07/28/2013  . Previous cesarean delivery x 2 07/22/2013  . Hemochromatosis 11/10/2012  . Chronic pain syndrome 10/13/2012  . Leukocytosis 03/30/2012    PLAN: continue FHR monitoring/NST daily Has completed latency antibiotics and betamethasone regimen. Continue umbilical artery dopplers weekly for umbilical vein varix Continue pain regimen as per Sickle Cell service Continue to monitor bleeding closely. Continue routine antenatal care.    [name] 03/29/2017,10:21 AM

## 2017-03-29 NOTE — Progress Notes (Signed)
Skin care provided on wound. Applied new bandage.

## 2017-03-30 LAB — RPR: RPR: NONREACTIVE

## 2017-03-30 NOTE — Progress Notes (Addendum)
7209 : RPR and Type & Screen collected  0716: Pt reported that she continue to leak scant amount of pink ting fluids. She also said her rt leg pain continues to "bother' her however their are periods of relief.

## 2017-03-30 NOTE — Progress Notes (Signed)
Pt refused wound dressing change this morning. She states, "I change it every other day." Pt says she will change it tomorrow since it was changed yesterday.

## 2017-03-30 NOTE — Progress Notes (Signed)
Patient ID: [name], female   DOB: June 28, 1970, 46 y.o.   MRN: [id number] [place]) NOTE   [name] Mcallister Kouroumais a 89 y.W.[id number] at [redacted]w[redacted]d who is admitted for PPROM andSScrisis. Also has right lateral previa with associaited subchorionic hematoma with mild bleeding since admission and umbilical vein varix. Fetal presentation is breech  th of Stay:  10  Days  Subjective: Still has right leg pain  Patient reports the fetal movement as active. Patient reports uterine contraction  activity as none. Patient reports  vaginal bleeding as scant staining. Patient describes fluid per vagina as Other blood stained.  Vitals:  Blood pressure 111/68, pulse 83, temperature (!) 97.5 F (36.4 C), temperature source Oral, resp. rate 16, height 5\' 7"  (1.702 m), weight 53.5 kg (118 lb), last menstrual period 10/10/2016, SpO2 96 %. Physical Examination:  General appearance - alert, well appearing, and in no distress Heart - normal rate and regular rhythm Abdomen - soft, nontender, nondistended Fundal Height:  size equals dates Cervical Exam: Not evaluated. . Extremities: extremities normal, atraumatic, no cyanosis or edema and Homans sign is negative, no sign of DVT Membranes:ruptured  Fetal Monitoring:     Fetal Heart Rate A  Mode External filed at 03/29/2017 1606  Baseline Rate (A) 160 bpm filed at 03/29/2017 1606  Variability 6-25 BPM filed at 03/29/2017 1606  Accelerations 10 x 10 filed at 03/29/2017 1606  Decelerations Variable filed at 03/29/2017 1606     Labs:  No results found for this or any previous visit (from the past 24 hour(s)).   Medications:  Scheduled . aspirin EC  81 mg Oral Daily  . docusate sodium  100 mg Oral BID  . enoxaparin (LOVENOX) injection  40 mg Subcutaneous Q24H  . feeding supplement (ENSURE ENLIVE)  237 mL Oral BID BM  . folic acid  5 mg Oral Daily  . oxyCODONE  10 mg Oral Q4H  . prenatal multivitamin  1 tablet Oral  Q1200  . sodium chloride flush  3 mL Intravenous Q12H   I have reviewed the patient's current medications.  ASSESSMENT: Patient Active Problem List   Diagnosis Date Noted  . Ulcer of right lower extremity ([place])   . Preterm premature rupture of membranes (PPROM)  in second trimester, antepartum 03/20/2017  . History of preterm delivery, currently pregnant in second trimester 03/20/2017  . Umbilical vein abnormality affecting pregnancy (varix) 03/20/2017  . 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 Partial placenta previa - right lateral previa 02/21/2017  . Abnormal MSAFP (maternal serum alpha-fetoprotein), elevated 02/18/2017  . Subchorionic hematoma in second trimester 12/23/2016  . Supervision of high risk pregnancy, antepartum 12/16/2016  . History of pre-eclampsia 12/16/2016  . Sickle cell anemia of mother during pregnancy ([place]) 11/09/2016  . Advanced maternal age in multigravida 07/28/2013  . Previous cesarean delivery x 2 07/22/2013  . Hemochromatosis 11/10/2012  . Chronic pain syndrome 10/13/2012  . Leukocytosis 03/30/2012    PLAN: continue FHR monitoring/NST daily Continue umbilical artery dopplers weekly for umbilical vein varix Continue pain regimen as per Sickle Cell service Continue to monitor bleeding closely. Continue routine antenatal care.    [name] 03/30/2017,7:42 AM

## 2017-03-31 DIAGNOSIS — O4422 Partial placenta previa NOS or without hemorrhage, second trimester: Secondary | ICD-10-CM

## 2017-03-31 NOTE — Progress Notes (Signed)
[place] PROGRESS NOTE  [name] is a 46 y.o. [id number] at [redacted]w[redacted]d who is admitted for PPROM and SS crisis.  Estimated Date of Delivery: 07/17/17. Also with right lateral previa, umbilical vein varix, subchorionic hemorrhage Fetal presentation is unsure.  Length of Stay:  11 Days. Admitted 03/20/2017  Subjective: Patient reports normal fetal movement.  She reports no uterine contractions, no leaking of clear fluid. She does continue to have dark brown spotting, no increase.   Vitals:  Blood pressure 132/80, pulse 88, temperature 98.4 F (36.9 C), temperature source Oral, resp. rate 18, height 5\' 7"  (1.702 m), weight 118 lb (53.5 kg), last menstrual period 10/10/2016, SpO2 100 %. Physical Examination: CONSTITUTIONAL: Well-developed, well-nourished female in no acute distress.  HENT:  Normocephalic, atraumatic, External right and left ear normal. Oropharynx is clear and moist EYES: Conjunctivae and EOM are normal. Pupils are equal, round, and reactive to light. No scleral icterus.  NECK: Normal range of motion, supple, no masses. SKIN: Skin is warm and dry. No rash noted. Not diaphoretic. No erythema. No pallor. [place]: Alert and oriented to person, place, and time. Normal reflexes, muscle tone coordination. No cranial nerve deficit noted. PSYCHIATRIC: Normal mood and affect. Normal behavior. Normal judgment and thought content. CARDIOVASCULAR: Normal heart rate noted, regular rhythm RESPIRATORY: Effort and breath sounds normal, no problems with respiration noted MUSCULOSKELETAL: Normal range of motion. No tenderness left leg, significant tenderness right thigh and calf, dressing covering right ankle. Right ankle with healing wounds on medial and lateral sides, medial side with pink healthy, new tissue noted with scab covering wound, lateral wound with pink healthy tissue and 1 area of 0.5 cm wound exposed,  ABDOMEN: Soft, nontender, nondistended, gravid. CERVIX:   deferred  Fetal monitoring: FHR: 150s bpm, Variability: moderate, Accelerations: Present, Decelerations: Absent  Uterine activity: none contractions per hour  Results for orders placed or performed during the hospital encounter of 03/20/17 (from the past 48 hour(s))  Type and screen [place]     Status: None (Preliminary result)   Collection Time: 03/30/17  5:26 AM  Result Value Ref Range   ABO/RH(D) B POS    Antibody Screen NEG    Sample Expiration 04/02/2017    Unit Number [id number]    Blood Component Type RED CELLS,LR    Unit division 00    Status of Unit ALLOCATED    Donor AG Type      NEGATIVE FOR C ANTIGEN NEGATIVE FOR E ANTIGEN NEGATIVE FOR KELL ANTIGEN   Unit tag comment HEMOGLOBIN S NEGATIVE    Transfusion Status OK TO TRANSFUSE    Crossmatch Result Compatible    Unit Number [id number]    Blood Component Type RED CELLS,LR    Unit division 00    Status of Unit ALLOCATED    Donor AG Type      NEGATIVE FOR C ANTIGEN NEGATIVE FOR E ANTIGEN NEGATIVE FOR KELL ANTIGEN   Unit tag comment HEMOGLOBIN S NEGATIVE    Transfusion Status OK TO TRANSFUSE    Crossmatch Result Compatible   RPR     Status: None   Collection Time: 03/30/17  5:26 AM  Result Value Ref Range   RPR Ser Ql Non Reactive Non Reactive    Comment: (NOTE) Performed At: [facility] [address] [place], [place] [id number] Rush Farmer MD WE:[id number]     No results found.  Current scheduled medications . aspirin EC  81 mg Oral Daily  . docusate sodium  100 mg Oral BID  . enoxaparin (LOVENOX) injection  40 mg Subcutaneous Q24H  . feeding supplement (ENSURE ENLIVE)  237 mL Oral BID BM  . folic acid  5 mg Oral Daily  . oxyCODONE  10 mg Oral Q4H  . prenatal multivitamin  1 tablet Oral Q1200  . sodium chloride flush  3 mL Intravenous Q12H    I have reviewed the patient's current medications.  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[place])   History of preterm delivery, currently pregnant in second trimester   Umbilical vein abnormality affecting pregnancy (varix)   Ulcer of right lower extremity ([place])   PLAN: PPROM  - in house til delivery - S/p latency (11/1-11/8) - s/p BTMZ (11/6-7) - weekly scans, next 04/03/17 - s/p NICU consult 11/9  Right lateral previa - repeat c-section  [facility] - signed BTL papers but does not want BTL done if CS is GA, wants to discuss with provider prior to CS  Routine prenatal care - regular diet - 2 hr GTT @ 26 weeks - Tdap @ 28 weeks - repeat HIV/RPR @ 28 weeks  FWB - NST q shift - h/o abnormal quad for OSB, Tri21, --> normal NIPS  Sickle Cell crisis - pain management per medicine (oxycodone and dilaudid) - reconsult if needed (Dr. Liston Alba, pager [phone])  Leg ulcer - appears to be healing well - cont every other day dressing change  Reviewed plan of care with patient, answered all questions.  Feliz Beam, M.D. Attending [place], [facility] for [employer], [place]

## 2017-03-31 NOTE — Progress Notes (Signed)
Dr. Rosana Hoes @ the bedside to evaluate patient's leg pain 10/10. Plan of care discussed. Toya Smothers, RN

## 2017-04-01 NOTE — Progress Notes (Signed)
[place] PROGRESS NOTE  [name] is a 46 y.o. [id number] at 109w5d who is admitted for PPROM, SS crisis.  Estimated Date of Delivery: 2/28/19Also with right lateral previa, umbilical vein varix, subchorionic hemorrhage. Fetal presentation is unsure.  Length of Stay:  12 Days. Admitted 03/20/2017  Subjective:  Patient reports normal fetal movement.  She reports no uterine contractions, no leaking of fluid. Continues to have dark brown spotting, no more than usual. Right leg still with pain, no acute issues or complaints today.  Vitals:  Blood pressure 110/65, pulse 86, temperature 98.2 F (36.8 C), temperature source Oral, resp. rate 18, height 5\' 7"  (1.702 m), weight 118 lb (53.5 kg), last menstrual period 10/10/2016, SpO2 98 %. Physical Examination: CONSTITUTIONAL: Well-developed, well-nourished female in no acute distress.  HENT:  Normocephalic, atraumatic, External right and left ear normal. Oropharynx is clear and moist EYES: Conjunctivae and EOM are normal. Pupils are equal, round, and reactive to light. No scleral icterus.  NECK: Normal range of motion, supple, no masses. SKIN: Skin is warm and dry. No rash noted. Not diaphoretic. No erythema. No pallor. [place]: Alert and oriented to person, place, and time. Normal reflexes, muscle tone coordination. No cranial nerve deficit noted. PSYCHIATRIC: Normal mood and affect. Normal behavior. Normal judgment and thought content. CARDIOVASCULAR: Normal heart rate noted, regular rhythm RESPIRATORY: Effort and breath sounds normal, no problems with respiration noted MUSCULOSKELETAL: Normal range of motion. No edema and no tenderness. ABDOMEN: Soft, nontender, nondistended, gravid. CERVIX:  deferred  Fetal monitoring: FHR: 150s bpm, Variability: moderate, Accelerations: Present, Decelerations: Absent  Uterine activity: no contractions per hour  No results found for this or any previous visit (from the past 48  hour(s)).  No results found.  Current scheduled medications . aspirin EC  81 mg Oral Daily  . docusate sodium  100 mg Oral BID  . enoxaparin (LOVENOX) injection  40 mg Subcutaneous Q24H  . feeding supplement (ENSURE ENLIVE)  237 mL Oral BID BM  . folic acid  5 mg Oral Daily  . oxyCODONE  10 mg Oral Q4H  . prenatal multivitamin  1 tablet Oral Q1200  . sodium chloride flush  3 mL Intravenous Q12H    I have reviewed the patient's current medications.  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[place])   History of preterm delivery, currently pregnant in second trimester   Umbilical vein abnormality affecting pregnancy (varix)   Ulcer of right lower extremity ([place])   PLAN: PPROM  - in-house til delivery - S/p latency (11/1-11/8) - s/p BTMZ (11/6-7) - repeat BTMZ prn - weekly scans, next 04/03/17 - s/p NICU consult 11/9  Right lateral previa - repeat c-section  [facility] - signed BTL papers but does not want BTL done if CS is GA, wants to discuss with provider prior to CS  Routine prenatal care - regular diet - 2 hr GTT @ 26 weeks - Tdap @ 28 weeks - repeat HIV/RPR @ 28 weeks  FWB - NST q shift - h/o abnormal quad for OSB, Tri21, --> normal NIPS  Sickle Cell crisis - pain management per medicine (oxycodone  and dilaudid) - reconsult if needed (Dr. Liston Alba, pager [phone])  Leg ulcer - appears to be healing well - cont every other day dressing change  Reviewed plan of care with patient, answered all questions.   Feliz Beam, M.D. Attending [place], [facility] for [employer], [place]

## 2017-04-02 LAB — TYPE AND SCREEN
ABO/RH(D): B POS
ANTIBODY SCREEN: NEGATIVE
UNIT DIVISION: 0
UNIT TAG COMMENT: NEGATIVE
UNIT TAG COMMENT: NEGATIVE
Unit division: 0

## 2017-04-02 LAB — BPAM RBC
BLOOD PRODUCT EXPIRATION DATE: 201811292359
Blood Product Expiration Date: 201811292359
Unit Type and Rh: 9500
Unit Type and Rh: 9500

## 2017-04-02 NOTE — Progress Notes (Signed)
Placed on EFM due to more vaginal bleeding than she has been having per Dr Rosana Hoes.

## 2017-04-02 NOTE — Progress Notes (Signed)
[name] PROGRESS NOTE  [name] is a 46 y.o. O2V0350 at [redacted]w[redacted]d who is admitted for PPROM, sickle cell crisis.  Estimated Date of Delivery: 07/17/17  Also with right lateral previa, umbilical vein varix, subchorionic hemorrhage. Fetal presentation is unsure.  Length of Stay:  13 Days. Admitted 03/20/2017  Subjective: Patient reports slightly increased brown blood on pad this am. Denies leaking fluid, contractions. Reports normal fetal movement. Leg pain about usual today.  Vitals:  Blood pressure 113/60, pulse 78, temperature 98.3 F (36.8 C), temperature source Oral, resp. rate 16, height 5\' 7"  (1.702 m), weight 118 lb (53.5 kg), last menstrual period 10/10/2016, SpO2 97 %. Physical Examination: CONSTITUTIONAL: Well-developed, well-nourished female in no acute distress.  HENT:  Normocephalic, atraumatic, External right and left ear normal. Oropharynx is clear and moist EYES: Conjunctivae and EOM are normal. Pupils are equal, round, and reactive to light. No scleral icterus.  NECK: Normal range of motion, supple, no masses. SKIN: Skin is warm and dry. No rash noted. Not diaphoretic. No erythema. No pallor. [place]: Alert and oriented to person, place, and time. Normal reflexes, muscle tone coordination. No cranial nerve deficit noted. PSYCHIATRIC: Normal mood and affect. Normal behavior. Normal judgment and thought content. CARDIOVASCULAR: Normal heart rate noted, regular rhythm RESPIRATORY: Effort and breath sounds normal, no problems with respiration noted MUSCULOSKELETAL: Normal range of motion. No edema and no tenderness. ABDOMEN: Soft, nontender, nondistended, gravid. GU: moderate amount brownish blood on pad  Fetal monitoring: FHR: 145 bpm, Variability: moderate, Accelerations: not present, Decelerations: Absent  Uterine activity: none contractions per hour  Results for orders placed or performed during the hospital encounter of 03/20/17 (from the past 48  hour(s))  Type and screen [place]     Status: None   Collection Time: 04/02/17  5:32 AM  Result Value Ref Range   ABO/RH(D) B POS    Antibody Screen NEG    Sample Expiration 04/05/2017     No results found.  Current scheduled medications . aspirin EC  81 mg Oral Daily  . docusate sodium  100 mg Oral BID  . enoxaparin (LOVENOX) injection  40 mg Subcutaneous Q24H  . feeding supplement (ENSURE ENLIVE)  237 mL Oral BID BM  . folic acid  5 mg Oral Daily  . oxyCODONE  10 mg Oral Q4H  . prenatal multivitamin  1 tablet Oral Q1200  . sodium chloride flush  3 mL Intravenous Q12H    I have reviewed the patient's current medications.  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)   History of preterm delivery, currently pregnant in second trimester   Umbilical vein abnormality affecting pregnancy (varix)   Ulcer of right lower extremity ([place])   PLAN: Increased bleeding today. Will keep patient on EFM for now and monitor bleeding. NPO for now.  PPROM  - in-house til delivery - S/p latency (11/1-11/8) - s/p BTMZ (11/6-7) - repeat BTMZ prn - weekly scans, next 04/03/17 - s/p NICU consult 11/9  Right lateral previa - repeat c-section  [facility] -signed BTL papersbut does not want BTL done ifCS is GA,wants to  discuss with provider prior to CS  Routine prenatal care - regular diet - 2 hr GTT @ 26 weeks - Tdap @ 28 weeks - repeat HIV/RPR @ 28 weeks  FWB - NST q shift - h/o abnormal quad for OSB, Tri21, --> normal NIPS  Sickle Cell crisis - pain management  per medicine(oxycodone and dilaudid) - reconsult if needed (Dr. Liston Alba, pager [phone])  Leg ulcer - appears to be healing well - cont every other day dressing change   Continue routine antenatal care.   Feliz Beam, M.D. Attending [place], [facility] for [employer], [place]

## 2017-04-03 ENCOUNTER — Inpatient Hospital Stay (HOSPITAL_COMMUNITY): Payer: Medicaid Other

## 2017-04-03 NOTE — Plan of Care (Signed)
Patient reports continued small amount of vaginal bleeding with fingernail sized clots. Complains of pain 7/10 to her right leg. K-pad in use for LLE pain. Medications also given on schedule and PRN for pain. Effective relief of pain reported.

## 2017-04-03 NOTE — Plan of Care (Signed)
Patient seems more relaxed and comfortable tonight than last night. Up ad lib in room, showered and had dressing changed to her right ankle. Tolerating activity and meal well.

## 2017-04-03 NOTE — Progress Notes (Signed)
Dressing change to Right ankle deferred per patient's request to "wait until I shower."

## 2017-04-03 NOTE — Progress Notes (Signed)
[place] NOTE  [name] is a 46 y.o. [id number] at [redacted]w[redacted]d who is admitted for PPROM, sickle cell crisis.  Estimated Date of Delivery: 07/17/17. Also with right lateral previa, subchorionic hemorrhage, umbilical vein varix Fetal presentation is breech.  Length of Stay:  14 Days. Admitted 03/20/2017  Subjective:  Patient reports normal fetal movement.  She reports no uterine contractions, and no loss of fluid per vagina. Reports she is still having small amount dark brown blood on pad.  Vitals:  Blood pressure 111/66, pulse 80, temperature 98.4 F (36.9 C), temperature source Oral, resp. rate 18, height 5\' 7"  (1.702 m), weight 121 lb (54.9 kg), last menstrual period 10/10/2016, SpO2 93 %. Physical Examination: CONSTITUTIONAL: Well-developed, well-nourished female in no acute distress.  HENT:  Normocephalic, atraumatic, External right and left ear normal. Oropharynx is clear and moist EYES: Conjunctivae and EOM are normal. Pupils are equal, round, and reactive to light. No scleral icterus.  NECK: Normal range of motion, supple, no masses. SKIN: Skin is warm and dry. No rash noted. Not diaphoretic. No erythema. No pallor. [place]: Alert and oriented to person, place, and time. Normal reflexes, muscle tone coordination. No cranial nerve deficit noted. PSYCHIATRIC: Normal mood and affect. Normal behavior. Normal judgment and thought content. CARDIOVASCULAR: Normal heart rate noted, regular rhythm RESPIRATORY: Effort and breath sounds normal, no problems with respiration noted MUSCULOSKELETAL: Normal range of motion. No edema and no tenderness. ABDOMEN: Soft, nontender, nondistended, gravid. CERVIX: deferred  Fetal monitoring: FHR: 150 bpm, Variability: moderate, Accelerations: absent, Decelerations: Absent  Uterine activity: no contractions per hour  Results for orders placed or performed during the hospital encounter of 03/20/17 (from the past 48 hour(s))  Type  and screen [place]     Status: None (Preliminary result)   Collection Time: 04/02/17  5:32 AM  Result Value Ref Range   ABO/RH(D) B POS    Antibody Screen NEG    Sample Expiration 04/05/2017    Unit Number [id number]    Blood Component Type RED CELLS,LR    Unit division 00    Status of Unit ALLOCATED    Transfusion Status OK TO TRANSFUSE    Crossmatch Result Compatible    Unit Number [id number]    Blood Component Type RED CELLS,LR    Unit division 00    Status of Unit ALLOCATED    Transfusion Status OK TO TRANSFUSE    Crossmatch Result Compatible     No results found.  Current scheduled medications . aspirin EC  81 mg Oral Daily  . docusate sodium  100 mg Oral BID  . enoxaparin (LOVENOX) injection  40 mg Subcutaneous Q24H  . feeding supplement (ENSURE ENLIVE)  237 mL Oral BID BM  . folic acid  5 mg Oral Daily  . oxyCODONE  10 mg Oral Q4H  . prenatal multivitamin  1 tablet Oral Q1200  . sodium chloride flush  3 mL Intravenous Q12H    I have reviewed the patient's current medications.  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[place])   History of preterm delivery, currently pregnant in second trimester  Umbilical vein abnormality affecting pregnancy (varix)   Ulcer of right lower extremity ([facility])   PLAN: PPROM  - repeat US today - in-house til delivery - S/p latency (11/1-11/8) - s/p BTMZ (11/6-7) - repeat BTMZ prn - weekly scans - s/p NICU consult 11/9  Right lateral previa - repeat c-section  [facility] -signed BTL papersbut does not want BTL  done ifCS is GA,wants to discuss with provider prior to CS  Routine prenatal care - regular diet - 2 hr GTT @ 26 weeks - Tdap @ 28 weeks - repeat HIV/RPR @ 28 weeks  FWB - NST q shift - h/o abnormal quad for OSB, Tri21, --> normal NIPS  Sickle Cell crisis - pain management per medicine(oxycodone and dilaudid) - reconsult if needed (Dr. Liston Alba, pager [phone])  Leg ulcer - appears to be healing well  Continue routine antenatal care.   Feliz Beam, M.D. Attending [place], [facility] for [employer], [place]

## 2017-04-04 NOTE — Progress Notes (Signed)
[place] NOTE  Imajean Mcdermid is a 46 y.o. [id number] at [redacted]w[redacted]d who is admitted for sickle cell crisis, PPROM, right lateral previa, subchorionic hemorrhage.  Estimated Date of Delivery: 07/17/17 Breech presen  Length of Stay:  15 Days. Admitted 03/20/2017  Subjective:  Patient reports normal fetal movement.  She denies uterine contractions, leaking of fluid. Reports her usual small amount dark brown bleeding.  Vitals:  Blood pressure 112/69, pulse 84, temperature 98.5 F (36.9 C), temperature source Oral, resp. rate 16, height 5\' 7"  (1.702 m), weight 121 lb (54.9 kg), last menstrual period 10/10/2016, SpO2 94 %. Physical Examination: CONSTITUTIONAL: Well-developed, well-nourished female in no acute distress.  HENT:  Normocephalic, atraumatic, External right and left ear normal. Oropharynx is clear and moist EYES: Conjunctivae and EOM are normal. Pupils are equal, round, and reactive to light. No scleral icterus.  NECK: Normal range of motion, supple, no masses. SKIN: Skin is warm and dry. No rash noted. Not diaphoretic. No erythema. No pallor. [place]: Alert and oriented to person, place, and time. Normal reflexes, muscle tone coordination. No cranial nerve deficit noted. PSYCHIATRIC: Normal mood and affect. Normal behavior. Normal judgment and thought content. CARDIOVASCULAR: Normal heart rate noted, regular rhythm RESPIRATORY: Effort and breath sounds normal, no problems with respiration noted MUSCULOSKELETAL: Normal range of motion. No edema and no tenderness. ABDOMEN: Soft, nontender, nondistended, gravid. CERVIX:    Fetal monitoring: not done yet today, yesterday afternoon was reactive Uterine activity: not on monitor yet today  No results found for this or any previous visit (from the past 48 hour(s)).    Current scheduled medications . aspirin EC  81 mg Oral Daily  . docusate sodium  100 mg Oral BID  . enoxaparin (LOVENOX) injection  40 mg  Subcutaneous Q24H  . feeding supplement (ENSURE ENLIVE)  237 mL Oral BID BM  . folic acid  5 mg Oral Daily  . oxyCODONE  10 mg Oral Q4H  . prenatal multivitamin  1 tablet Oral Q1200  . sodium chloride flush  3 mL Intravenous Q12H    I have reviewed the patient's current medications.  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[place])   History of preterm delivery, currently pregnant in second trimester   Umbilical vein abnormality affecting pregnancy (varix)   Ulcer of right lower extremity ([place])   PLAN: PPROM  - repeat US yesterday with hematoma/abruption unchanged - umbilical vein varix now not meeting criteria - in-house til delivery - S/p latency (11/1-11/8) - s/p BTMZ (11/6-7) - repeat BTMZ prn - weekly scans Thursdays - s/p NICU consult 11/9  Right lateral previa - repeat c-section  [facility] -signed BTL papersbut does not want BTL done ifCS is GA,wants to discuss with provider prior to CS  Routine prenatal care - regular diet - 2 hr GTT @ 26 weeks - Tdap @ 28 weeks - repeat HIV/RPR @ 28 weeks  FWB - NST q shift - h/o abnormal quad for OSB, Tri21, --> normal NIPS  Sickle Cell crisis - pain management per medicine(oxycodone and dilaudid) - reconsult if needed (Dr. Liston Alba, pager [phone])  Leg ulcer -  appears to be healing well   Continue routine antenatal care.   Feliz Beam, M.D. Attending [place], [facility] for [employer], [place]

## 2017-04-04 NOTE — Progress Notes (Signed)
I received a referral from Terri Piedra, [place], to offer spiritual support to pt who will be in the hospital until delivery.  Pt was very quiet, but was able to voice her frustration with being here over a long period of time, even though she wants to do what she needs to for this baby.  She is concerned about her children at home who are being cared for by their father.  She did not wish to talk further today, but she welcomed another visit next week.    [place], [place] Pager, [phone] 3:00 PM    04/04/17 1400  Clinical Encounter Type  Visited With Patient  Visit Type Spiritual support  Referral From Social work  Spiritual Encounters  Spiritual Needs Emotional

## 2017-04-05 DIAGNOSIS — B354 Tinea corporis: Secondary | ICD-10-CM | POA: Diagnosis present

## 2017-04-05 LAB — TYPE AND SCREEN
ABO/RH(D): B POS
ANTIBODY SCREEN: NEGATIVE

## 2017-04-05 MED ORDER — MICONAZOLE NITRATE 2 % EX CREA
TOPICAL_CREAM | Freq: Three times a day (TID) | CUTANEOUS | Status: DC
  Administered 2017-04-05 – 2017-04-06 (×4): via TOPICAL
  Administered 2017-04-06: 1 via TOPICAL
  Administered 2017-04-06 – 2017-04-15 (×26): via TOPICAL
  Administered 2017-04-15: 1 via TOPICAL
  Administered 2017-04-16 – 2017-04-24 (×25): via TOPICAL
  Administered 2017-04-24: 1 via TOPICAL
  Administered 2017-04-25 – 2017-04-30 (×18): via TOPICAL
  Administered 2017-05-01: 1 via TOPICAL
  Administered 2017-05-01 – 2017-05-07 (×17): via TOPICAL
  Filled 2017-04-05 (×4): qty 14

## 2017-04-05 NOTE — Progress Notes (Signed)
Pt O2 sats 91% on room-air. Applied 2L O2 via nasal cannula. Had another nurse come to assist getting the baby on the monitor. Baby very active and difficult to pick up HR on the monitor. Pt also complaining of pressure. Notified Dr. Ilda Basset. He is en route to department.

## 2017-04-05 NOTE — Progress Notes (Addendum)
2103: 02 sat 90%   @1Litre . 02 increased to 2 LNC  Sat 97% > We will continue to monitor.  2254: Dilaudid 1 mg IV administered for rt leg pain 9/10 prn. We will continue to monitor.  2310: Report given to the on-coming nurse.

## 2017-04-05 NOTE — Progress Notes (Signed)
Pt states she passed two dime sized clots after urination. Placed urine hat in toilet to monitor clots and size. Informed Dr. Ilda Basset. No new orders at this time. Will continue to monitor.

## 2017-04-05 NOTE — Progress Notes (Signed)
[place] NOTE  [name] is a 46 y.o. [id number] at [redacted]w[redacted]d who is admitted for sickle cell crisis, PPROM, right lateral previa, subchorionic hemorrhage, two previous cesarean sections.  Breech presentation.  Estimated Date of Delivery: 07/17/17  Length of Stay:  16 Days. Admitted 03/20/2017  Subjective: Reports having increased itching around "ringworm-like" lesion around left shoulder.   Patient reports normal fetal movement.  She denies uterine contractions, leaking of fluid. Reports her usual small amount dark brown bleeding.  Vitals:  Blood pressure 126/74, pulse 78, temperature 98.6 F (37 C), temperature source Oral, resp. rate 18, height 5\' 7"  (1.702 m), weight 121 lb (54.9 kg), last menstrual period 10/10/2016, SpO2 95 %. Physical Examination: CONSTITUTIONAL: Well-developed, well-nourished female in no acute distress.  HENT:  Normocephalic, atraumatic, External right and left ear normal. Oropharynx is clear and moist EYES: Conjunctivae and EOM are normal. Pupils are equal, round, and reactive to light. No scleral icterus.  NECK: Normal range of motion, supple, no masses. SKIN: Skin is warm and dry. Large ringworm like, dry lesion over her left shoulder, No erythema. Not diaphoretic. No erythema. No pallor. [place]: Alert and oriented to person, place, and time. Normal reflexes, muscle tone coordination. No cranial nerve deficit noted. PSYCHIATRIC: Normal mood and affect. Normal behavior. Normal judgment and thought content. CARDIOVASCULAR: Normal heart rate noted, regular rhythm RESPIRATORY: Effort and breath sounds normal, no problems with respiration noted MUSCULOSKELETAL: Normal range of motion. No edema and no tenderness. ABDOMEN: Soft, nontender, nondistended, gravid. CERVIX:  Deferred  Fetal monitoring: 150s baseline, moderate variability, + small accelerations, no decelerations Uterine activity: None  No results found for this or any previous  visit (from the past 48 hour(s)).    Current scheduled medications . aspirin EC  81 mg Oral Daily  . docusate sodium  100 mg Oral BID  . enoxaparin (LOVENOX) injection  40 mg Subcutaneous Q24H  . feeding supplement (ENSURE ENLIVE)  237 mL Oral BID BM  . folic acid  5 mg Oral Daily  . miconazole   Topical TID  . oxyCODONE  10 mg Oral Q4H  . prenatal multivitamin  1 tablet Oral Q1200  . sodium chloride flush  3 mL Intravenous Q12H    I have reviewed the patient's current medications.  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[place])   History of preterm delivery, currently pregnant in second trimester   Umbilical vein abnormality affecting pregnancy (varix)   Ulcer of right lower extremity ([place])   Ringworm of body   PLAN: PPROM  - repeat US 04/04/17 with hematoma/abruption unchanged - in-house til delivery - S/p latency (11/1-11/8) - s/p BMZ (11/6-7) - repeat BTMZ prn - weekly scans Thursdays - s/p NICU consult 11/9  Right lateral previa - repeat c-section - umbilical vein varix now not meeting criteria  BCM -signed BTL papersbut does not want BTL done ifCS is GA,wants to discuss with provider prior to CS  Routine prenatal care - regular diet - 2 hr GTT @ 26 weeks - Tdap @ 28 weeks - repeat HIV/RPR @ 28 weeks  FWB - NST q shift -  h/o abnormal quad for OSB, Tri21, --> normal NIPS  Sickle Cell crisis - pain management per medicine(oxycodone and dilaudid) - reconsult if needed (Dr. Liston Alba, pager [phone])  Leg ulcer - appears to be healing  well  Ringworm - Miconazole cream ordered  Continue routine antenatal care.   [name], MD Attending [place], [facility] for [facility], [place]

## 2017-04-05 NOTE — Progress Notes (Addendum)
OB Note  CTSP for low sats on RA and pelvic pressure.  Patient denies any chest pain, sob, fevers, chills, VB, cramping or contractions and inability to find fhts  Patient Vitals for the past 24 hrs:  BP Temp Temp src Pulse Resp SpO2  04/05/17 1156 125/73 98.3 F (36.8 C) Oral 91 16 95 %  04/05/17 0859 108/65 98 F (36.7 C) Oral 81 16 93 %  04/05/17 0357 126/74 98.6 F (37 C) Oral 78 18 95 %  04/04/17 2030 124/76 98.5 F (36.9 C) Oral 91 16 93 %  04/04/17 1601 113/77 98.9 F (37.2 C) Oral 83 16 94 %  HR in the 80s  Toco: quiet  NAD Currently 98% on 2L [place], no resp distress, ctab No mrgs, normal s1 and s2 Abd: gravid, nttp  Bedside u/s: complete breech, FHR 130s, no AF, some FM  A/p: pt stable Keep an eye on if any rpt VB episodes Finish out NST Try and wean o2 but late afternoon and if cant then cxr, lab w/u  Durene Romans MD Attending Center for [employer] (Faculty Practice) 04/05/2017 Time: 1230pm

## 2017-04-06 LAB — BPAM RBC
BLOOD PRODUCT EXPIRATION DATE: 201811292359
Blood Product Expiration Date: 201811292359
UNIT TYPE AND RH: 9500
UNIT TYPE AND RH: 9500

## 2017-04-06 LAB — CBC
HCT: 22.5 % — ABNORMAL LOW (ref 36.0–46.0)
Hemoglobin: 7.7 g/dL — ABNORMAL LOW (ref 12.0–15.0)
MCH: 30.4 pg (ref 26.0–34.0)
MCHC: 34.2 g/dL (ref 30.0–36.0)
MCV: 88.9 fL (ref 78.0–100.0)
PLATELETS: 290 10*3/uL (ref 150–400)
RBC: 2.53 MIL/uL — ABNORMAL LOW (ref 3.87–5.11)
RDW: 23.4 % — AB (ref 11.5–15.5)
WBC: 15.1 10*3/uL — ABNORMAL HIGH (ref 4.0–10.5)

## 2017-04-06 LAB — TYPE AND SCREEN
ABO/RH(D): B POS
ANTIBODY SCREEN: NEGATIVE
UNIT DIVISION: 0
Unit division: 0

## 2017-04-06 MED ORDER — ALBUTEROL SULFATE (2.5 MG/3ML) 0.083% IN NEBU
2.5000 mg | INHALATION_SOLUTION | Freq: Once | RESPIRATORY_TRACT | Status: AC
  Administered 2017-04-06: 2.5 mg via RESPIRATORY_TRACT
  Filled 2017-04-06: qty 3

## 2017-04-06 NOTE — Progress Notes (Signed)
Dr. Glo Herring made aware of SpO2 of 78. No new orders received. Patient sitting up in chair in room. Denies any pain or shortness of breath. Breath sounds are clear bilaterally.

## 2017-04-06 NOTE — Progress Notes (Signed)
Call placed to Dr. Glo Herring. Dr. Glo Herring made aware of Patient's O2 Sat levels of 91 to 93 on room air. Order received to give Patient O2 at 2 L/min via nasal cannula until she has received her scheduled nebulizer treatment, and then remove nasal cannula after treatment, and notify Dr. Glo Herring if O2 saturation is less than 95. Patient made aware of new order.

## 2017-04-06 NOTE — Progress Notes (Addendum)
Daily Antepartum Note  Admission Date: 03/20/2017 Current Date: 04/06/2017 7:52 AM  [name] is a 46 y.o. [id number] @ [redacted]w[redacted]d, HD#18, admitted for PPROM.  Pregnancy complicated by: right lateral previa, AMA, [place] disease, h/o c-section x 2, umbilical vein varix .  Overnight/24hr events:  Stable on 2L [place] o/n  Subjective:  No chest pain, sob, fevers, chills, PTL s/s. Still having some LOF sometimes pink like before prior to admission  Objective:    Current Vital Signs 24h Vital Sign Ranges  T 97.7 F (36.5 C) Temp  Avg: 97.9 F (36.6 C)  Min: 97.7 F (36.5 C)  Max: 98.3 F (36.8 C)  BP 112/65 BP  Min: 100/67  Max: 130/69  HR 80 Pulse  Avg: 84.9  Min: 78  Max: 91  RR 18 Resp  Avg: 17.3  Min: 16  Max: 20  SaO2 98 % Nasal Cannula SpO2  Avg: 95.1 %  Min: 90 %  Max: 98 %       24 Hour I/O Current Shift I/O  Time Ins Outs No intake/output data recorded. No intake/output data recorded.   Patient Vitals for the past 24 hrs:  BP Temp Temp src Pulse Resp SpO2  04/06/17 0356 112/65 97.7 F (36.5 C) Oral 80 18 98 %  04/06/17 0103 121/67 98 F (36.7 C) Oral 78 20 97 %  04/05/17 2348 130/69 97.8 F (36.6 C) Oral 86 20 96 %  04/05/17 2103 - - - - - 97 %  04/05/17 2100 - - - - - 90 %  04/05/17 2012 100/67 97.9 F (36.6 C) Oral 86 16 96 %  04/05/17 1724 (!) 104/57 97.9 F (36.6 C) Oral 90 16 94 %  04/05/17 1300 113/60 - - 87 16 -  04/05/17 1156 125/73 98.3 F (36.8 C) Oral 91 16 95 %  04/05/17 0859 108/65 98 F (36.7 C) Oral 81 16 93 %   Patient goes to 95-96 on RA  FHT: 145 baseline, +accels, ?rare slight variable, moderate variability Toco:quiet  Physical exam: General: Well nourished, well developed female in no acute distress. Abdomen: gravid, nttp Cardiovascular: S1, S2 normal, no murmur, rub or gallop, regular rate and rhythm Respiratory: CTAB, no resp distress Extremities: no clubbing, cyanosis or edema Skin: Warm and dry.   Medications: Current  Facility-Administered Medications  Medication Dose Route Frequency Provider Last Rate Last Dose  . acetaminophen (TYLENOL) tablet 650 mg  650 mg Oral Q4H PRN Aletha Halim, MD   650 mg at 04/03/17 1241  . albuterol (PROVENTIL) (2.5 MG/3ML) 0.083% nebulizer solution 2.5 mg  2.5 mg Nebulization Once Aletha Halim, MD      . aspirin EC tablet 81 mg  81 mg Oral Daily Aletha Halim, MD   81 mg at 04/05/17 1029  . calcium carbonate (TUMS - dosed in mg elemental calcium) chewable tablet 400 mg of elemental calcium  2 tablet Oral Q4H PRN Aletha Halim, MD   400 mg of elemental calcium at 04/02/17 0424  . docusate sodium (COLACE) capsule 100 mg  100 mg Oral BID Anyanwu, Ugonna A, MD   100 mg at 04/05/17 2254  . enoxaparin (LOVENOX) injection 40 mg  40 mg Subcutaneous Q24H Aletha Halim, MD   40 mg at 04/05/17 2254  . feeding supplement (ENSURE ENLIVE) (ENSURE ENLIVE) liquid 237 mL  237 mL Oral BID BM Anyanwu, Ugonna A, MD   237 mL at 33/82/50 5397  . folic acid (FOLVITE) tablet 5 mg  5 mg Oral  Daily Truett Mainland, DO   5 mg at 04/05/17 1029  . HYDROmorphone (DILAUDID) injection 1 mg  1 mg Intravenous Q3H PRN Leana Gamer, MD   1 mg at 04/06/17 0507  . miconazole (MICOTIN) 2 % cream   Topical TID Anyanwu, Ugonna A, MD      . oxyCODONE (Oxy IR/ROXICODONE) immediate release tablet 10 mg  10 mg Oral Q4H Woodroe Mode, MD   10 mg at 04/06/17 0742  . prenatal multivitamin tablet 1 tablet  1 tablet Oral Q1200 Aletha Halim, MD   1 tablet at 04/05/17 1151  . RESTA SILVER GEL 1 application  1 application Apply externally Daily PRN Anyanwu, Sallyanne Havers, MD   1 application at 36/64/40 2046  . sodium chloride flush (NS) 0.9 % injection 3 mL  3 mL Intravenous Q12H Truett Mainland, DO   3 mL at 04/05/17 2254  . zolpidem (AMBIEN) tablet 5 mg  5 mg Oral QHS PRN Aletha Halim, MD   5 mg at 04/01/17 0102    Labs:  No new labs  Radiology:  11/18: breech (bedside u/s) 11/15: breech, right  lateral previa. AFI 2.5, 930gm, 77%, AC 95%, , marginal abruption unchanged. UVV only 58mm 11/1: breech, FHR 171, right lateral previa. 737gm, efw 77%, AC >97%, CL 2.9cm. 5cm [place]  Assessment & Plan:  Pt stable *Pregnancy:routine care. BTL papers signed. D/w pt prior to actually doing if very early [place]. qday nst *Preterm: Mg for fetal NP if able to if for delivery -s/p NICU consult -s/pt BMZ on 11/2 and on 11/6 and 11/7 -s/p latency abx *h/o c-section: will need repeat *Previa: stable *UVV: continue with qwk scans *Derm: continue cream *PPx: OOB ad lib, lovenox qday *FEN/GI: regular diet. Saline lock IV *Dispo: here until delivery  Durene Romans. MD Attending Center for [facility])

## 2017-04-06 NOTE — Progress Notes (Signed)
Patient declines dressing change to right ankle at this time. Education provided to Patient about the importance of changing the dressing as ordered. Patient verbalizes an understanding.

## 2017-04-06 NOTE — Progress Notes (Signed)
Offered to change Patient's dressing to right ankle. Patient states, "No. I don't want it changed. They changed it yesterday." Explained to Patient that the dressing is changed daily. Educated Patient on the importance of changing her dressing daily and infection prevention. Patient verbalizes an understanding, but continues to decline a dressing change.

## 2017-04-06 NOTE — Progress Notes (Signed)
Dr. Glo Herring made aware of SpO2 of 92. No new orders received.

## 2017-04-06 NOTE — Progress Notes (Signed)
Patient in bed. SpO2 is 95. Patient denies any pain or discomfort. Patient denies any shortness of breath.

## 2017-04-06 NOTE — Progress Notes (Addendum)
Assumed care from [facility], RN.

## 2017-04-06 NOTE — Progress Notes (Signed)
Dr. Glo Herring made aware of consistent SpO2 readings of less than 95. No new orders received at this time. Okay to discontinue continuous pulse oximetry readings per Dr. Glo Herring.

## 2017-04-07 LAB — FERRITIN: Ferritin: 1345 ng/mL — ABNORMAL HIGH (ref 11–307)

## 2017-04-07 MED ORDER — BISACODYL 10 MG RE SUPP
10.0000 mg | Freq: Once | RECTAL | Status: AC
  Administered 2017-04-07: 10 mg via RECTAL
  Filled 2017-04-07: qty 1

## 2017-04-07 MED ORDER — FERROUS GLUCONATE 324 (38 FE) MG PO TABS
324.0000 mg | ORAL_TABLET | Freq: Every day | ORAL | Status: DC
  Administered 2017-04-08: 324 mg via ORAL
  Filled 2017-04-07: qty 1

## 2017-04-07 NOTE — Progress Notes (Signed)
Offered to change dressing to Right leg at 2100, patient refused stating she would rather have it done in the morning. Offered again at 0430 with pain meds but patient declined again. Reviewed need for continued care to wound site, patient voiced understanding. Advised that we will continue to offer and to call when she is ready, patient agreed

## 2017-04-07 NOTE — Progress Notes (Addendum)
Daily Antepartum Note  Admission Date: 03/20/2017 Current Date: 04/07/2017 10:20 AM  [name] is a 46 y.o. [id number] @ [redacted]w[redacted]d, HD#19, admitted for PPROM.  Pregnancy complicated by: right lateral previa, AMA, [place] disease, h/o c-section x 2, umbilical vein varix .  Overnight/24hr events:  Weaned to RA  Subjective:  No chest pain, sob, fevers, chills, PTL s/s. Still having some LOF sometimes pink like before prior to admission  Objective:    Current Vital Signs 24h Vital Sign Ranges  T 98.2 F (36.8 C) Temp  Avg: 98.3 F (36.8 C)  Min: 98 F (36.7 C)  Max: 98.7 F (37.1 C)  BP 111/67 BP  Min: 108/58  Max: 119/63  HR 76 Pulse  Avg: 82.8  Min: 76  Max: 91  RR 16 Resp  Avg: 15.6  Min: 14  Max: 16  SaO2 97 %(after using the incentive spirometer) Not Delivered SpO2  Avg: 92.8 %  Min: 91 %  Max: 97 %       24 Hour I/O Current Shift I/O  Time Ins Outs 11/18 0701 - 11/19 0700 In: 3 [I.V.:3] Out: -  No intake/output data recorded.   FHT: 140 baseline, +?accels, ?rare slight variable, moderate variability Toco:quiet  Physical exam: General: Well nourished, well developed female in no acute distress. Abdomen: gravid, nttp Cardiovascular: S1, S2 normal, no murmur, rub or gallop, regular rate and rhythm Respiratory: CTAB, no resp distress Extremities: no clubbing, cyanosis or edema Skin: Warm and dry.   Medications: Current Facility-Administered Medications  Medication Dose Route Frequency Provider Last Rate Last Dose  . acetaminophen (TYLENOL) tablet 650 mg  650 mg Oral Q4H PRN Aletha Halim, MD   650 mg at 04/03/17 1241  . aspirin EC tablet 81 mg  81 mg Oral Daily Aletha Halim, MD   81 mg at 04/07/17 0948  . calcium carbonate (TUMS - dosed in mg elemental calcium) chewable tablet 400 mg of elemental calcium  2 tablet Oral Q4H PRN Aletha Halim, MD   400 mg of elemental calcium at 04/02/17 0424  . docusate sodium (COLACE) capsule 100 mg  100 mg Oral BID Anyanwu,  Ugonna A, MD   100 mg at 04/07/17 0948  . enoxaparin (LOVENOX) injection 40 mg  40 mg Subcutaneous Q24H Aletha Halim, MD   40 mg at 04/06/17 2235  . feeding supplement (ENSURE ENLIVE) (ENSURE ENLIVE) liquid 237 mL  237 mL Oral BID BM Anyanwu, Ugonna A, MD   237 mL at 04/07/17 0948  . folic acid (FOLVITE) tablet 5 mg  5 mg Oral Daily Truett Mainland, DO   5 mg at 04/07/17 9470  . HYDROmorphone (DILAUDID) injection 1 mg  1 mg Intravenous Q3H PRN Leana Gamer, MD   1 mg at 04/07/17 0748  . miconazole (MICOTIN) 2 % cream   Topical TID Anyanwu, Ugonna A, MD      . oxyCODONE (Oxy IR/ROXICODONE) immediate release tablet 10 mg  10 mg Oral Q4H Woodroe Mode, MD   10 mg at 04/07/17 0748  . prenatal multivitamin tablet 1 tablet  1 tablet Oral Q1200 Aletha Halim, MD   1 tablet at 04/06/17 1259  . RESTA SILVER GEL 1 application  1 application Apply externally Daily PRN Anyanwu, Sallyanne Havers, MD   1 application at 96/28/36 2046  . sodium chloride flush (NS) 0.9 % injection 3 mL  3 mL Intravenous Q12H Truett Mainland, DO   3 mL at 04/07/17 0950  . zolpidem (AMBIEN)  tablet 5 mg  5 mg Oral QHS PRN Aletha Halim, MD   5 mg at 04/01/17 0102    Labs:  No new labs  Radiology:  11/18: breech (bedside u/s) 11/15: breech, right lateral previa. AFI 2.5, 930gm, 77%, AC 95%, [place], marginal abruption unchanged. UVV only 4mm 11/1: breech, FHR 171, right lateral previa. 737gm, efw 77%, AC >97%, CL 2.9cm. 5cm [place]  Assessment & Plan:  Pt stable *Pregnancy:routine care. BTL papers signed. D/w pt prior to actually doing if very early [place]. qday nst. Appropriate for GA yesterday. *Preterm: Mg for fetal NP if able to if for delivery. Consider rescue bmz on 11/21 and 22 -s/p NICU consult -s/p BMZ on 11/2 and on 11/6 and 11/7 -s/p latency abx *h/o c-section: will need repeat *Previa: stable *Anemia: stable and near baseline. Check ferritin and add more iron prn *UVV: continue with qwk scans *Derm: continue  cream *PPx: OOB ad lib, lovenox qday *FEN/GI: regular diet. Saline lock IV *Dispo: here until delivery  Durene Romans. MD Attending Center for [facility])

## 2017-04-08 LAB — TYPE AND SCREEN
ABO/RH(D): B POS
Antibody Screen: NEGATIVE

## 2017-04-08 NOTE — Progress Notes (Signed)
Daily Antepartum Note  Admission Date: 03/20/2017 Current Date: 04/08/2017 10:05 AM  Rowan Brodbeck is a 46 y.o. [id number] @ [redacted]w[redacted]d, HD#20, admitted for PPROM.  Pregnancy complicated by: right lateral previa, AMA, [place] disease, h/o c-section x 2, umbilical vein varix .  Overnight/24hr events:  None  Subjective:  No decreased FM, abdominal pain or cramping. Still having some LOF sometimes pink like before prior to admission  Objective:    Current Vital Signs 24h Vital Sign Ranges  T 98.1 F (36.7 C) Temp  Avg: 98.3 F (36.8 C)  Min: 98.1 F (36.7 C)  Max: 98.7 F (37.1 C)  BP 109/76 BP  Min: 109/76  Max: 124/71  HR 86 Pulse  Avg: 88.5  Min: 80  Max: 99  RR 18 Resp  Avg: 16.7  Min: 16  Max: 18  SaO2 95 % Not Delivered SpO2  Avg: 93.4 %  Min: 92 %  Max: 95 %       24 Hour I/O Current Shift I/O  Time Ins Outs No intake/output data recorded. No intake/output data recorded.   FHT: 150 baseline, +accels, no decel, moderate variability Toco:quiet  Physical exam: General: Well nourished, well developed female in no acute distress. Abdomen: gravid, nttp Cardiovascular: S1, S2 normal, no murmur, rub or gallop, regular rate and rhythm Respiratory: CTAB, no resp distress Extremities: no clubbing, cyanosis or edema Skin: Warm and dry.   Medications: Current Facility-Administered Medications  Medication Dose Route Frequency Provider Last Rate Last Dose  . acetaminophen (TYLENOL) tablet 650 mg  650 mg Oral Q4H PRN Aletha Halim, MD   650 mg at 04/03/17 1241  . aspirin EC tablet 81 mg  81 mg Oral Daily Aletha Halim, MD   81 mg at 04/07/17 0948  . calcium carbonate (TUMS - dosed in mg elemental calcium) chewable tablet 400 mg of elemental calcium  2 tablet Oral Q4H PRN Aletha Halim, MD   400 mg of elemental calcium at 04/02/17 0424  . docusate sodium (COLACE) capsule 100 mg  100 mg Oral BID Anyanwu, Ugonna A, MD   100 mg at 04/07/17 2243  . enoxaparin (LOVENOX) injection 40 mg   40 mg Subcutaneous Q24H Aletha Halim, MD   40 mg at 04/07/17 2243  . feeding supplement (ENSURE ENLIVE) (ENSURE ENLIVE) liquid 237 mL  237 mL Oral BID BM Anyanwu, Ugonna A, MD   237 mL at 04/07/17 1256  . folic acid (FOLVITE) tablet 5 mg  5 mg Oral Daily Truett Mainland, DO   5 mg at 04/07/17 4431  . HYDROmorphone (DILAUDID) injection 1 mg  1 mg Intravenous Q3H PRN Leana Gamer, MD   1 mg at 04/08/17 0848  . miconazole (MICOTIN) 2 % cream   Topical TID Anyanwu, Ugonna A, MD      . oxyCODONE (Oxy IR/ROXICODONE) immediate release tablet 10 mg  10 mg Oral Q4H Woodroe Mode, MD   10 mg at 04/08/17 0843  . prenatal multivitamin tablet 1 tablet  1 tablet Oral Q1200 Aletha Halim, MD   1 tablet at 04/07/17 1256  . RESTA SILVER GEL 1 application  1 application Apply externally Daily PRN Anyanwu, Sallyanne Havers, MD   1 application at 54/00/86 1609  . sodium chloride flush (NS) 0.9 % injection 3 mL  3 mL Intravenous Q12H Truett Mainland, DO   3 mL at 04/08/17 0849  . zolpidem (AMBIEN) tablet 5 mg  5 mg Oral QHS PRN Aletha Halim, MD   5  mg at 04/01/17 0102    Labs:  No new labs  Radiology:  11/18: breech (bedside u/s) 11/15: breech, right lateral previa. AFI 2.5, 930gm, 77%, AC 95%, [place], marginal abruption unchanged. UVV only 36mm 11/1: breech, FHR 171, right lateral previa. 737gm, efw 77%, AC >97%, CL 2.9cm. 5cm [place]  Assessment & Plan:  Pt stable *Pregnancy:routine care. BTL papers signed. D/w pt prior to actually doing if very early [place]. qday nst.  *Preterm: Mg for fetal NP if able to if for delivery. Consider rescue bmz on 11/21 and 22 -s/p NICU consult -s/p BMZ on 11/2 and on 11/6 and 11/7 -s/p latency abx *h/o c-section: will need repeat *Previa: stable *Anemia: stable and near baseline. Ferritin very high.  *UVV: continue with qwk scans (scheduled for tomorrow) *Derm: continue cream *PPx: OOB ad lib, lovenox qday *FEN/GI: regular diet. Saline lock IV *Dispo: here until  delivery  Durene Romans. MD Attending Center for [place] [facility])

## 2017-04-09 ENCOUNTER — Inpatient Hospital Stay (HOSPITAL_COMMUNITY): Payer: Medicaid Other

## 2017-04-09 NOTE — Progress Notes (Signed)
Daily Antepartum Note  Admission Date: 03/20/2017 Current Date: 04/09/2017 2:46 PM  [name] is a 46 y.o. [id number] @ [redacted]w[redacted]d, HD#21, admitted for PPROM.  Pregnancy complicated by: right lateral previa, AMA, [place] disease, h/o c-section x 2, umbilical vein varix .  Overnight/24hr events:  None  Subjective:  No decreased FM, abdominal pain or cramping. Scant LOF, pink like before prior to admission  Objective:    Current Vital Signs 24h Vital Sign Ranges  T 97.7 F (36.5 C) Temp  Avg: 98.3 F (36.8 C)  Min: 97.7 F (36.5 C)  Max: 98.6 F (37 C)  BP 119/75 BP  Min: 112/59  Max: 125/74  HR 79 Pulse  Avg: 86.3  Min: 79  Max: 96  RR 18 Resp  Avg: 17.7  Min: 17  Max: 18  SaO2 90 % Not Delivered SpO2  Avg: 91.7 %  Min: 90 %  Max: 95 %       24 Hour I/O Current Shift I/O  Time Ins Outs No intake/output data recorded. No intake/output data recorded.   FHT: 140 baseline, +accels, one slight variable decel, moderate variability Toco:quiet  Physical exam: General: Well nourished, well developed female in no acute distress. Abdomen: gravid, nttp Cardiovascular: S1, S2 normal, no murmur, rub or gallop, regular rate and rhythm Respiratory: CTAB, no resp distress Extremities: no clubbing, cyanosis or edema Skin: Warm and dry.   Medications: Current Facility-Administered Medications  Medication Dose Route Frequency Provider Last Rate Last Dose  . acetaminophen (TYLENOL) tablet 650 mg  650 mg Oral Q4H PRN Aletha Halim, MD   650 mg at 04/03/17 1241  . aspirin EC tablet 81 mg  81 mg Oral Daily Aletha Halim, MD   81 mg at 04/09/17 1207  . calcium carbonate (TUMS - dosed in mg elemental calcium) chewable tablet 400 mg of elemental calcium  2 tablet Oral Q4H PRN Aletha Halim, MD   400 mg of elemental calcium at 04/02/17 0424  . docusate sodium (COLACE) capsule 100 mg  100 mg Oral BID Anyanwu, Ugonna A, MD   100 mg at 04/09/17 1058  . enoxaparin (LOVENOX) injection 40 mg  40 mg  Subcutaneous Q24H Aletha Halim, MD   40 mg at 04/08/17 2221  . feeding supplement (ENSURE ENLIVE) (ENSURE ENLIVE) liquid 237 mL  237 mL Oral BID BM Anyanwu, Ugonna A, MD   237 mL at 04/09/17 1058  . folic acid (FOLVITE) tablet 5 mg  5 mg Oral Daily Truett Mainland, DO   5 mg at 04/09/17 1058  . HYDROmorphone (DILAUDID) injection 1 mg  1 mg Intravenous Q3H PRN Leana Gamer, MD   1 mg at 04/09/17 0929  . miconazole (MICOTIN) 2 % cream   Topical TID Anyanwu, Ugonna A, MD      . oxyCODONE (Oxy IR/ROXICODONE) immediate release tablet 10 mg  10 mg Oral Q4H Woodroe Mode, MD   10 mg at 04/09/17 1206  . prenatal multivitamin tablet 1 tablet  1 tablet Oral Q1200 Aletha Halim, MD   1 tablet at 04/09/17 1206  . RESTA SILVER GEL 1 application  1 application Apply externally Daily PRN Anyanwu, Sallyanne Havers, MD   1 application at 45/40/98 1609  . sodium chloride flush (NS) 0.9 % injection 3 mL  3 mL Intravenous Q12H Truett Mainland, DO   3 mL at 04/09/17 1059  . zolpidem (AMBIEN) tablet 5 mg  5 mg Oral QHS PRN Aletha Halim, MD   5 mg  at 04/01/17 0102    Labs:  No new labs  Radiology:  11/21: oligo, breech 11/15: breech, right lateral previa. AFI 2.5, 930gm, 77%, AC 95%, [place], marginal abruption unchanged. UVV only 92mm 11/1: breech, FHR 171, right lateral previa. 737gm, efw 77%, AC >97%, CL 2.9cm. 5cm [place]  Assessment & Plan:  Pt stable *Pregnancy: follow up final u/s read. routine care. BTL papers signed. D/w pt prior to actually doing if very early [place]. qday nst.  *Preterm: Mg for fetal NP if able to if for delivery. Consider rescue bmz on 11/21 and 22 -s/p NICU consult -s/p BMZ on 11/2 and on 11/6 and 11/7 -s/p latency abx *h/o c-section: will need repeat *Previa: stable *Anemia: stable and near baseline. Ferritin very high.  *UVV: continue with qwk scans (repeat 11/28) *Derm: continue cream *PPx: OOB ad lib, lovenox qday *FEN/GI: regular diet. Saline lock IV *Dispo: here until  delivery  Durene Romans. MD Attending Center for [facility])

## 2017-04-10 NOTE — Progress Notes (Signed)
Daily Antepartum Note  Admission Date: 03/20/2017 Current Date: 04/10/2017 9:27 AM  [name] is a 46 y.o. [id number] @ [redacted]w[redacted]d , 21 , admitted for PPROM.  Pregnancy complicated by: right lateral previa, AMA, Hewitt disease, h/o c-section x 2, umbilical vein varix .  Overnight/24hr events:  None  Subjective:  No decreased FM, abdominal pain or cramping. Scant LOF, pink like before prior to admission  Objective:    Current Vital Signs 24h Vital Sign Ranges  T 98.2 F (36.8 C) Temp  Avg: 98 F (36.7 C)  Min: 97.7 F (36.5 C)  Max: 98.2 F (36.8 C)  BP 124/72 BP  Min: 109/60  Max: 124/72  HR 80 Pulse  Avg: 81.5  Min: 79  Max: 86  RR 18 Resp  Avg: 18  Min: 18  Max: 18  SaO2 90 % Not Delivered SpO2  Avg: 90.8 %  Min: 90 %  Max: 92 %       24 Hour I/O Current Shift I/O  Time Ins Outs No intake/output data recorded. No intake/output data recorded.   FHT: 140 baseline, +accels, one slight variable decel, moderate variability Toco:quiet  Physical exam: General: Well nourished, well developed female in no acute distress. Abdomen: gravid, nttp Cardiovascular: S1, S2 normal, no murmur, rub or gallop, regular rate and rhythm Respiratory: CTAB, no resp distress Extremities: no clubbing, cyanosis or edema Skin: Warm and dry.   Medications: Current Facility-Administered Medications  Medication Dose Route Frequency Provider Last Rate Last Dose  . acetaminophen (TYLENOL) tablet 650 mg  650 mg Oral Q4H PRN Aletha Halim, MD   650 mg at 04/03/17 1241  . aspirin EC tablet 81 mg  81 mg Oral Daily Aletha Halim, MD   81 mg at 04/09/17 1207  . calcium carbonate (TUMS - dosed in mg elemental calcium) chewable tablet 400 mg of elemental calcium  2 tablet Oral Q4H PRN Aletha Halim, MD   400 mg of elemental calcium at 04/02/17 0424  . docusate sodium (COLACE) capsule 100 mg  100 mg Oral BID Anyanwu, Ugonna A, MD   100 mg at 04/09/17 2300  . enoxaparin (LOVENOX) injection 40 mg  40 mg  Subcutaneous Q24H Aletha Halim, MD   40 mg at 04/09/17 2300  . feeding supplement (ENSURE ENLIVE) (ENSURE ENLIVE) liquid 237 mL  237 mL Oral BID BM Anyanwu, Ugonna A, MD   237 mL at 04/09/17 1553  . folic acid (FOLVITE) tablet 5 mg  5 mg Oral Daily Truett Mainland, DO   5 mg at 04/09/17 1058  . HYDROmorphone (DILAUDID) injection 1 mg  1 mg Intravenous Q3H PRN Leana Gamer, MD   1 mg at 04/10/17 0121  . miconazole (MICOTIN) 2 % cream   Topical TID Anyanwu, Ugonna A, MD      . oxyCODONE (Oxy IR/ROXICODONE) immediate release tablet 10 mg  10 mg Oral Q4H Woodroe Mode, MD   10 mg at 04/10/17 1856  . prenatal multivitamin tablet 1 tablet  1 tablet Oral Q1200 Aletha Halim, MD   1 tablet at 04/09/17 1206  . RESTA SILVER GEL 1 application  1 application Apply externally Daily PRN Anyanwu, Sallyanne Havers, MD   1 application at 31/49/70 1603  . sodium chloride flush (NS) 0.9 % injection 3 mL  3 mL Intravenous Q12H Truett Mainland, DO   3 mL at 04/09/17 2300  . zolpidem (AMBIEN) tablet 5 mg  5 mg Oral QHS PRN Aletha Halim, MD  5 mg at 04/01/17 0102    Labs:  No new labs  Radiology:  11/21: oligo, breech 11/15: breech, right lateral previa. AFI 2.5, 930gm, 77%, AC 95%, [place], marginal abruption unchanged. UVV only 65mm 11/1: breech, FHR 171, right lateral previa. 737gm, efw 77%, AC >97%, CL 2.9cm. 5cm [place]  Assessment & Plan:  Pt stable *Pregnancy: follow up final u/s read. routine care. BTL papers signed. D/w pt prior to actually doing if very early GA. qday nst.  *Preterm: Mg for fetal NP if able to if for delivery. Consider rescue bmz on 11/21 and 22 -s/p NICU consult -s/p BMZ on 11/2 and on 11/6 and 11/7 -s/p latency abx *h/o c-section: will need repeat *Previa: stable *Anemia: stable and near baseline. Ferritin very high.  *UVV: continue with qwk scans (repeat 11/28) *Derm: continue cream *PPx: OOB ad lib, lovenox qday *FEN/GI: regular diet. Saline lock IV *Dispo: here until  delivery  [name], MD 04/10/2017 9:27 AM  Patient ID: [name], female   DOB: 1970-06-08, 46 y.o.   MRN: [id number]

## 2017-04-11 LAB — TYPE AND SCREEN
ABO/RH(D): B POS
Antibody Screen: NEGATIVE

## 2017-04-11 NOTE — Progress Notes (Signed)
I received a referral from pt's nurse because pt has become increasingly quiet and with a flat affect.  I checked in to see how she was doing.  She did not wish to speak at this time, but seemed appreciative of [place] checking in.  She is open to [place] connecting with her again in the future.  Ethel, [place] Pager, [phone] 11:54 AM    04/11/17 1100  Clinical Encounter Type  Visited With Patient  Visit Type Spiritual support  Referral From Nurse  Spiritual Encounters  Spiritual Needs Emotional

## 2017-04-11 NOTE — Progress Notes (Signed)
Patient ID: [name], female   DOB: 1970/10/10, 46 y.o.   MRN: [id number] [place] COMPREHENSIVE PROGRESS NOTE  [name] is a 46 y.o. [id number] at [redacted]w[redacted]d  who is admitted for PROM.   Fetal presentation is breech. Length of Stay:  22  Days  Subjective: Pt without complaints this morning. No change in slight pinkish discharge. Denies VB, cramps or contractions. Reports + FM. Tolerating diet.   Vitals:  Blood pressure 127/62, pulse 81, temperature 98.7 F (37.1 C), temperature source Oral, resp. rate 16, height 5\' 7"  (1.702 m), weight 54.9 kg (121 lb), last menstrual period 10/10/2016, SpO2 93 %. Physical Examination: Lungs clear Heart RRR Abd soft + BS gravid non tender Ext non tender Fetal Monitoring:  130-140's + accerls reactive for GA  Labs:  No results found for this or any previous visit (from the past 24 hour(s)).  Imaging Studies:    none   Medications:  Scheduled . aspirin EC  81 mg Oral Daily  . docusate sodium  100 mg Oral BID  . enoxaparin (LOVENOX) injection  40 mg Subcutaneous Q24H  . feeding supplement (ENSURE ENLIVE)  237 mL Oral BID BM  . folic acid  5 mg Oral Daily  . miconazole   Topical TID  . oxyCODONE  10 mg Oral Q4H  . prenatal multivitamin  1 tablet Oral Q1200  . sodium chloride flush  3 mL Intravenous Q12H   I have reviewed the patient's current medications.  ASSESSMENT: Patient Active Problem List   Diagnosis Date Noted  . Ringworm of body 04/05/2017  . Ulcer of right lower extremity ([place])   . Preterm premature rupture of membranes (PPROM)  in second trimester, antepartum 03/20/2017  . History of preterm delivery, currently pregnant in second trimester 03/20/2017  . Umbilical vein abnormality affecting pregnancy (varix) 03/20/2017  . 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 Partial placenta previa - right lateral previa 02/21/2017  . Abnormal MSAFP (maternal serum alpha-fetoprotein), elevated  02/18/2017  . Subchorionic hematoma in second trimester 12/23/2016  . Supervision of high risk pregnancy, antepartum 12/16/2016  . History of pre-eclampsia 12/16/2016  . Sickle cell anemia of mother during pregnancy ([place]) 11/09/2016  . Advanced maternal age in multigravida 07/28/2013  . Previous cesarean delivery x 2 07/22/2013  . Hemochromatosis 11/10/2012  . Chronic pain syndrome 10/13/2012  . Leukocytosis 03/30/2012    PLAN: Stable. No S/Sx of infection. Continue with present mamagement Continue routine antenatal care.   [name] 04/11/2017,7:32 AM

## 2017-04-12 DIAGNOSIS — O09523 Supervision of elderly multigravida, third trimester: Secondary | ICD-10-CM

## 2017-04-12 LAB — CBC
HEMATOCRIT: 21.8 % — AB (ref 36.0–46.0)
Hemoglobin: 7.8 g/dL — ABNORMAL LOW (ref 12.0–15.0)
MCH: 31.7 pg (ref 26.0–34.0)
MCHC: 35.8 g/dL (ref 30.0–36.0)
MCV: 88.6 fL (ref 78.0–100.0)
PLATELETS: 271 10*3/uL (ref 150–400)
RBC: 2.46 MIL/uL — ABNORMAL LOW (ref 3.87–5.11)
RDW: 24.8 % — AB (ref 11.5–15.5)
WBC: 14.3 10*3/uL — AB (ref 4.0–10.5)

## 2017-04-12 MED ORDER — DIPHENHYDRAMINE HCL 25 MG PO CAPS
25.0000 mg | ORAL_CAPSULE | Freq: Four times a day (QID) | ORAL | Status: DC | PRN
  Administered 2017-04-12 – 2017-04-22 (×3): 25 mg via ORAL
  Filled 2017-04-12 (×3): qty 1

## 2017-04-12 NOTE — Progress Notes (Signed)
Patient ID: [name], female   DOB: Mar 24, 1971, 46 y.o.   MRN: [id number] [name] ANTEPARTUM COMPREHENSIVE PROGRESS NOTE  [name] is a 46 y.o. [id number] at [redacted]w[redacted]d  who is admitted for PROM.   Fetal presentation is breech. Length of Stay:  23  Days  Subjective: Patient reports doing well and is without complaints. She reports the presence of a pinkish discharge. Denies VB, cramps or contractions. Reports + FM. Tolerating diet.   Vitals:  Blood pressure (!) 110/59, pulse 75, temperature 98.5 F (36.9 C), temperature source Oral, resp. rate 15, height 5\' 7"  (1.702 m), weight 123 lb 8 oz (56 kg), last menstrual period 10/10/2016, SpO2 91 %. Physical Examination: Lungs clear  Heart regular rate and rhythm Abd soft, gravid, non tender Ext non tender, equal in size Fetal Monitoring:  baseline 145, mod variability, +accels, no decels. Appropriate for gestational age  Labs:  Results for orders placed or performed during the hospital encounter of 03/20/17 (from the past 24 hour(s))  CBC   Collection Time: 04/12/17  5:41 AM  Result Value Ref Range   WBC 14.3 (H) 4.0 - 10.5 K/uL   RBC 2.46 (L) 3.87 - 5.11 MIL/uL   Hemoglobin 7.8 (L) 12.0 - 15.0 g/dL   HCT 21.8 (L) 36.0 - 46.0 %   MCV 88.6 78.0 - 100.0 fL   MCH 31.7 26.0 - 34.0 pg   MCHC 35.8 30.0 - 36.0 g/dL   RDW 24.8 (H) 11.5 - 15.5 %   Platelets 271 150 - 400 K/uL    Imaging Studies:    none   Medications:  Scheduled . aspirin EC  81 mg Oral Daily  . docusate sodium  100 mg Oral BID  . enoxaparin (LOVENOX) injection  40 mg Subcutaneous Q24H  . feeding supplement (ENSURE ENLIVE)  237 mL Oral BID BM  . folic acid  5 mg Oral Daily  . miconazole   Topical TID  . oxyCODONE  10 mg Oral Q4H  . prenatal multivitamin  1 tablet Oral Q1200  . sodium chloride flush  3 mL Intravenous Q12H   I have reviewed the patient's current medications.  ASSESSMENT: Patient Active Problem List   Diagnosis Date Noted  . Ringworm  of body 04/05/2017  . Ulcer of right lower extremity ([place])   . Preterm premature rupture of membranes (PPROM)  in second trimester, antepartum 03/20/2017  . History of preterm delivery, currently pregnant in second trimester 03/20/2017  . Umbilical vein abnormality affecting pregnancy (varix) 03/20/2017  . 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 Partial placenta previa - right lateral previa 02/21/2017  . Abnormal MSAFP (maternal serum alpha-fetoprotein), elevated 02/18/2017  . Subchorionic hematoma in second trimester 12/23/2016  . Supervision of high risk pregnancy, antepartum 12/16/2016  . History of pre-eclampsia 12/16/2016  . Sickle cell anemia of mother during pregnancy ([place]) 11/09/2016  . Advanced maternal age in multigravida 07/28/2013  . Previous cesarean delivery x 2 07/22/2013  . Hemochromatosis 11/10/2012  . Chronic pain syndrome 10/13/2012  . Leukocytosis 03/30/2012    PLAN: No S/Sx of chorioamnionitis Continue with present mamagement Continue routine antenatal care.   [name] 04/12/2017,8:14 AM

## 2017-04-13 ENCOUNTER — Encounter (HOSPITAL_COMMUNITY): Payer: Self-pay | Admitting: Orthopedic Surgery

## 2017-04-13 DIAGNOSIS — O42112 Preterm premature rupture of membranes, onset of labor more than 24 hours following rupture, second trimester: Secondary | ICD-10-CM

## 2017-04-13 DIAGNOSIS — O09523 Supervision of elderly multigravida, third trimester: Secondary | ICD-10-CM

## 2017-04-13 NOTE — Progress Notes (Signed)
[facility]) NOTE  [name] is a 46 y.o. [id number] at [redacted]w[redacted]d  who is admitted for PROM.   Fetal presentation is breech. Length of Stay:  24  Days  Subjective: Pt remains stable . No contractions. Patient reports the fetal movement as active. Patient reports uterine contraction  activity as none. Patient reports  vaginal bleeding as none. Patient describes fluid per vagina as Clear.  Vitals:  Blood pressure 123/76, pulse 80, temperature 98.1 F (36.7 C), temperature source Oral, resp. rate 16, height 5\' 7"  (1.702 m), weight 123 lb 8 oz (56 kg), last menstrual period 10/10/2016, SpO2 99 %. Physical Examination:  General appearance - alert, well appearing, and in no distress, oriented to person, place, and time and normal appearing weight Heart - normal rate and regular rhythm Abdomen - soft, nontender, nondistended Fundal Height:  size equals dates Cervical Exam: Not evaluated. and fetal presentation is breech. Extremities: extremities normal, atraumatic, no cyanosis or edema and Homans sign is negative, no sign of DVT with DTRs 2+ bilaterally , stable leg ulcerations. Membranes:ruptured, clear fluid  Fetal Monitoring:  q shift NST's  Labs:  No results found for this or any previous visit (from the past 24 hour(s)).  Imaging Studies:     Currently EPIC will not allow sonographic studies to automatically populate into notes.  In the meantime, copy and paste results into note or free text.  Medications:  Scheduled . aspirin EC  81 mg Oral Daily  . docusate sodium  100 mg Oral BID  . enoxaparin (LOVENOX) injection  40 mg Subcutaneous Q24H  . feeding supplement (ENSURE ENLIVE)  237 mL Oral BID BM  . folic acid  5 mg Oral Daily  . miconazole   Topical TID  . oxyCODONE  10 mg Oral Q4H  . prenatal multivitamin  1 tablet Oral Q1200  . sodium chloride flush  3 mL Intravenous Q12H   I have reviewed the patient's current  medications.  ASSESSMENT: Patient Active Problem List   Diagnosis Date Noted  . Ringworm of body 04/05/2017  . Ulcer of right lower extremity ([place])   . Preterm premature rupture of membranes (PPROM)  in second trimester, antepartum 03/20/2017  . History of preterm delivery, currently pregnant in second trimester 03/20/2017  . Umbilical vein abnormality affecting pregnancy (varix) 03/20/2017  . 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 Partial placenta previa - right lateral previa 02/21/2017  . Abnormal MSAFP (maternal serum alpha-fetoprotein), elevated 02/18/2017  . Subchorionic hematoma in second trimester 12/23/2016  . Supervision of high risk pregnancy, antepartum 12/16/2016  . History of pre-eclampsia 12/16/2016  . Sickle cell anemia of mother during pregnancy ([place]) 11/09/2016  . Advanced maternal age in multigravida 07/28/2013  . Previous cesarean delivery x 2 07/22/2013  . Hemochromatosis 11/10/2012  . Chronic pain syndrome 10/13/2012  . Leukocytosis 03/30/2012    PLAN:No S/Sx of chorioamnionitis Continue with present management  Continue routine antenatal care.     Jonnie Kind 04/13/2017,9:47 AM    Patient ID: [name], female   DOB: 1970/08/01, 46 y.o.   MRN: [id number]

## 2017-04-14 ENCOUNTER — Encounter: Payer: Medicaid Other | Admitting: Obstetrics and Gynecology

## 2017-04-14 LAB — TYPE AND SCREEN
ABO/RH(D): B POS
ANTIBODY SCREEN: NEGATIVE

## 2017-04-14 NOTE — Progress Notes (Signed)
Patient ID: [name], female   DOB: 06-05-1970, 46 y.o.   MRN: [id number]  [place]) NOTE  [name] is a 46 y.o. [id number] at [redacted]w[redacted]d who is admitted for PROM.   Fetal presentation is breech. Length of Stay:  25  Days  Subjective:  Patient reports the fetal movement as active. Patient reports uterine contraction  activity as none. Patient reports  vaginal bleeding as scant staining. Patient describes fluid per vagina as Clear.  Vitals:  Blood pressure 126/63, pulse 75, temperature 98.1 F (36.7 C), temperature source Oral, resp. rate 16, height 5\' 7"  (1.702 m), weight 123 lb 8 oz (56 kg), last menstrual period 10/10/2016, SpO2 91 %. Physical Examination:  General appearance - alert, well appearing, and in no distress Heart - normal rate and regular rhythm Abdomen - soft, nontender, nondistended Fundal Height:  size equals dates Cervical Exam: Not evaluated.. Extremities: extremities normal, atraumatic, no cyanosis or edema and Homans sign is negative, no sign of DVT  Membranes:ruptured  Fetal Monitoring:  Fetal Heart Rate A  Mode External filed at 04/13/2017 1631  Baseline Rate (A) 145 bpm filed at 04/13/2017 1631  Variability 6-25 BPM filed at 04/13/2017 1631  Accelerations 10 x 10 filed at 04/13/2017 1631  Decelerations Variable filed at 04/13/2017 1631     Labs:  Results for orders placed or performed during the hospital encounter of 03/20/17 (from the past 24 hour(s))  Type and screen [place]   Collection Time: 04/14/17  5:20 AM  Result Value Ref Range   ABO/RH(D) B POS    Antibody Screen NEG    Sample Expiration 04/17/2017       Medications:  Scheduled . aspirin EC  81 mg Oral Daily  . docusate sodium  100 mg Oral BID  . enoxaparin (LOVENOX) injection  40 mg Subcutaneous Q24H  . feeding supplement (ENSURE ENLIVE)  237 mL Oral BID BM  . folic acid  5 mg Oral Daily  . miconazole   Topical TID  .  oxyCODONE  10 mg Oral Q4H  . prenatal multivitamin  1 tablet Oral Q1200  . sodium chloride flush  3 mL Intravenous Q12H   I have reviewed the patient's current medications.  ASSESSMENT: Patient Active Problem List   Diagnosis Date Noted  . Ringworm of body 04/05/2017  . Ulcer of right lower extremity ([place])   . Preterm premature rupture of membranes (PPROM)  in second trimester, antepartum 03/20/2017  . History of preterm delivery, currently pregnant in second trimester 03/20/2017  . Umbilical vein abnormality affecting pregnancy (varix) 03/20/2017  . 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 Partial placenta previa - right lateral previa 02/21/2017  . Abnormal MSAFP (maternal serum alpha-fetoprotein), elevated 02/18/2017  . Subchorionic hematoma in second trimester 12/23/2016  . Supervision of high risk pregnancy, antepartum 12/16/2016  . History of pre-eclampsia 12/16/2016  . Sickle cell anemia of mother during pregnancy ([place]) 11/09/2016  . Advanced maternal age in multigravida 07/28/2013  . Previous cesarean delivery x 2 07/22/2013  . Hemochromatosis 11/10/2012  . Chronic pain syndrome 10/13/2012  . Leukocytosis 03/30/2012    PLAN:  No S/Sx ofchorioamnionitis Continue with present management  Continue routine antenatal care. No sx of sickle cell crisis  [name] 04/14/2017,9:37 AM

## 2017-04-14 NOTE — Progress Notes (Signed)
CSW wrote letter to the [place] [employer] in [place] per patient's request to inquire about her aunt coming to [facility] for an extended visit to assist the family now and at patient's delivery while she recovers from her c-section.

## 2017-04-14 NOTE — Progress Notes (Signed)
CSW met with patient in room 317. When CSW arrived, patient as resting in bed watching TV. Patient was pleasant and easy to engage. CSW inquired about patient's thoughts and feelings about extended admission stay.  Patient became tearful and shared "I'm just sad that I can't be home with my children." It feels like I am going to be here forever.  CSW validated and normalized patient's thoughts and feelings and encouraged patient to focus on patient doing was is best for her fetus.  CSW inquired about patient's supports and patient reported patient's husband is patient's primary supports however, patient has a minium amount of friends and family that are local. Patient happily shared that patient had visitors today and was able to leave patient's room and walk the halls of hospital; patient report walking with family made patient feel better. CSW asked patient if patient would be interested in coloring books and puzzles to help patient feel more relax and to occupy patient's time; patient was very receptive.  CSW will provide patient with materials.  CSW also encouraged patient to journal write; it was evident that patient's facial expression that patient was interested. CSW assessed patient for safety and patient denied SI and HI. CSW will continue to offer resources and supports to patient weekly while patient remains in hospital.  Laurey Arrow, MSW, [place] [employer] [phone]

## 2017-04-14 NOTE — Progress Notes (Signed)
Pt refused dressing change this morning, stated she wants it done later today. Will report to oncoming nurse.

## 2017-04-15 ENCOUNTER — Encounter (HOSPITAL_COMMUNITY): Payer: Self-pay

## 2017-04-15 NOTE — Progress Notes (Signed)
Patient declines wound care to ankle at this time.

## 2017-04-15 NOTE — Progress Notes (Signed)
Patient ID: [name], female   DOB: 04-19-1971, 46 y.o.   MRN: [id number]  [place]) NOTE  [name] is a 46 y.o. [id number] at [redacted]w[redacted]d who is admitted for PROM.   Fetal presentation is breech. Length of Stay:  26  Days  Subjective: MS pain is well controlled Patient reports the fetal movement as active. Patient reports uterine contraction  activity as none. Patient reports  vaginal bleeding as scant staining. Patient describes fluid per vagina as Clear.  Vitals:  Blood pressure 132/76, pulse 81, temperature (!) 97.5 F (36.4 C), temperature source Oral, resp. rate 16, height 5\' 7"  (1.702 m), weight 123 lb 8 oz (56 kg), last menstrual period 10/10/2016, SpO2 91 %. Physical Examination:  General appearance - alert, well appearing, and in no distress Heart - normal rate and regular rhythm Abdomen - soft, nontender, nondistended Fundal Height:  size equals dates Cervical Exam: Not evaluated.  Extremities: extremities normal, atraumatic, no cyanosis or edema and Homans sign is negative, no sign of DVT Membranes:ruptured  Fetal Monitoring:  Fetal Heart Rate A  Mode External filed at 04/14/2017 1740  Baseline Rate (A) 150 bpm filed at 04/14/2017 1740  Variability 6-25 BPM filed at 04/14/2017 1740  Accelerations 10 x 10, 15 x 15 filed at 04/14/2017 1740  Decelerations Variable filed at 04/14/2017 1740  Multiple birth? N filed at 04/02/2017 2016     Labs:  No results found for this or any previous visit (from the past 24 hour(s)).    Medications:  Scheduled . aspirin EC  81 mg Oral Daily  . docusate sodium  100 mg Oral BID  . enoxaparin (LOVENOX) injection  40 mg Subcutaneous Q24H  . feeding supplement (ENSURE ENLIVE)  237 mL Oral BID BM  . folic acid  5 mg Oral Daily  . miconazole   Topical TID  . oxyCODONE  10 mg Oral Q4H  . prenatal multivitamin  1 tablet Oral Q1200  . sodium chloride flush  3 mL Intravenous Q12H   I have reviewed  the patient's current medications.  ASSESSMENT: Patient Active Problem List   Diagnosis Date Noted  . Ringworm of body 04/05/2017  . Ulcer of right lower extremity ([place])   . Preterm premature rupture of membranes (PPROM)  in second trimester, antepartum 03/20/2017  . History of preterm delivery, currently pregnant in second trimester 03/20/2017  . Umbilical vein abnormality affecting pregnancy (varix) 03/20/2017  . 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 Partial placenta previa - right lateral previa 02/21/2017  . Abnormal MSAFP (maternal serum alpha-fetoprotein), elevated 02/18/2017  . Subchorionic hematoma in second trimester 12/23/2016  . Supervision of high risk pregnancy, antepartum 12/16/2016  . History of pre-eclampsia 12/16/2016  . Sickle cell anemia of mother during pregnancy ([place]) 11/09/2016  . Advanced maternal age in multigravida 07/28/2013  . Previous cesarean delivery x 2 07/22/2013  . Hemochromatosis 11/10/2012  . Chronic pain syndrome 10/13/2012  . Leukocytosis 03/30/2012    PLAN: Continue observation in hospital for PPROM. No evidence of sickle cell crisis Breech presentation, previous cesarean sections. Repeat cesarean if delivery is indicated [name] 04/15/2017,9:06 AM

## 2017-04-16 DIAGNOSIS — Z3A26 26 weeks gestation of pregnancy: Secondary | ICD-10-CM

## 2017-04-16 NOTE — Progress Notes (Signed)
CSW received very generic feedback from [place] in [place], which [place] shared with patient.  She cried and told CSW that she needs an immigration attorney.  CSW suggests she search for an immigration attorney if she feels this is what she needs in order to get her aunt an appointment with the [place] in [place].  She continued to cry and seemed frustrated, saying, "I'm stuck here.  I can't go to an attorney's office."  CSW explained that CSW can also not go to an attorney's office and apologized that CSW does not feel she can do what patient is asking of her.  Patient appeared frustrated.  CSW suggests that if an attorney wants her business, that he or may consider coming to the hospital to meet with her.  CSW states there will be a charge for the attorney and patient states she knows this.  CSW again offered to write a letter noting patient's condition and social situation if there is anyone that patient would like CSW to speak to/send information to.  CSW attempted to get patient to discuss her experience in the hospital and how she is feeling about her baby, hospitalization, c-section date, etc, but CSW was unsuccessful in engaging patient in conversation.   CSW then received call from patient requesting to talk with CSW again.  CSW met with her and she asked that CSW contact [place] because a friend of hers states that they can help to get her aunt approved for a Visa to come to the [place].  CSW has a connection with [employer] and left a message for [employer] while sitting with patient.  CSW told patient that CSW is not sure that Officer Mariane Masters will be able to help, but will let patient know if he returns CSW's call.  CSW acknowledges patient's frustration and notes that if patient can find out more information about what is needed to get her aunt an appointment with the [facility] and needs documentation from [place], [place] is more than willing to provide this.

## 2017-04-16 NOTE — Progress Notes (Signed)
Patient ID: [name], female   DOB: Oct 01, 1970, 46 y.o.   MRN: [id number] Signed            [] Hide copied text  [] Hover for details   Patient ID: [name], female   DOB: 12-30-1970, 46 y.o.   MRN: [id number]  [place]) NOTE  [name] is a 46 y.o. [id number] at [redacted]w[redacted]d who is admitted for PROM.   Fetal presentation is breech. Length of Stay:  27  Days  Subjective: MS pain is well controlled Patient reports the fetal movement as active. Patient reports uterine contraction  activity as none. Patient reports  vaginal bleeding as scant staining. Patient describes fluid per vagina as Clear.  Blood pressure (!) 106/56, pulse 83, temperature 98.2 F (36.8 C), temperature source Oral, resp. rate 16, height 5\' 7"  (1.702 m), weight 56 kg (123 lb 8 oz), last menstrual period 10/10/2016, SpO2 91 %.  Physical Examination:  General appearance - alert, well appearing, and in no distress Heart - normal rate and regular rhythm Abdomen - soft, nontender, nondistended Fundal Height:  size equals dates Cervical Exam: Not evaluated.  Extremities: extremities normal, atraumatic, no cyanosis or edema and Homans sign is negative, no sign of DVT Membranes:ruptured  Fetal Monitoring:       Fetal Heart Rate A  Mode External filed at 04/15/2017 1710  Baseline Rate (A) 145 bpm filed at 04/15/2017 1710  Variability 6-25 BPM filed at 04/15/2017 1710  Accelerations 15 x 15, 10 x 10 filed at 04/15/2017 1710  Decelerations None filed at 04/15/2017 1710     Labs:  No results found for this or any previous visit (from the past 24 hour(s)).    Medications:  Scheduled . aspirin EC  81 mg Oral Daily  . docusate sodium  100 mg Oral BID  . enoxaparin (LOVENOX) injection  40 mg Subcutaneous Q24H  . feeding supplement (ENSURE ENLIVE)  237 mL Oral BID BM  . folic acid  5 mg Oral Daily  . miconazole   Topical TID  . oxyCODONE  10 mg Oral Q4H  .  prenatal multivitamin  1 tablet Oral Q1200  . sodium chloride flush  3 mL Intravenous Q12H   I have reviewed the patient's current medications.  ASSESSMENT:     Patient Active Problem List   Diagnosis Date Noted  . Ringworm of body 04/05/2017  . Ulcer of right lower extremity ([place])   . Preterm premature rupture of membranes (PPROM)  in second trimester, antepartum 03/20/2017  . History of preterm delivery, currently pregnant in second trimester 03/20/2017  . Umbilical vein abnormality affecting pregnancy (varix) 03/20/2017  . 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 Partial placenta previa - right lateral previa 02/21/2017  . Abnormal MSAFP (maternal serum alpha-fetoprotein), elevated 02/18/2017  . Subchorionic hematoma in second trimester 12/23/2016  . Supervision of high risk pregnancy, antepartum 12/16/2016  . History of pre-eclampsia 12/16/2016  . 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 Advanced maternal age in multigravida 07/28/2013  . Previous cesarean delivery x 2 07/22/2013  . Hemochromatosis 11/10/2012  . Chronic pain syndrome 10/13/2012  . Leukocytosis 03/30/2012    PLAN: Continue observation in hospital for PPROM. No evidence of sickle cell crisis Breech presentation, previous cesarean sections. Repeat cesarean if delivery is indicated  [name] 04/16/2017  10:06 AM

## 2017-04-17 ENCOUNTER — Encounter (HOSPITAL_COMMUNITY): Payer: Self-pay

## 2017-04-17 LAB — TYPE AND SCREEN
ABO/RH(D): B POS
Antibody Screen: NEGATIVE

## 2017-04-17 NOTE — Progress Notes (Signed)
Patient ID: [name], female   DOB: [birth year], 46 y.o.   MRN: [id number] Patient DJ:SHFWY Mcclintic,femaleDOB:[birth year],46 y.o.[place]) NOTE  [name] Kouroumais a 46 y.O.[id number] at [redacted]w[redacted]d who is admitted for PROM.  Fetal presentation isbreech. Length of Stay:28Days  Subjective: MS pain is well controlled Patient reports the fetal movement asactive. Patient reports uterine contraction activity as none. Patient reports vaginal bleeding as scant staining. Patient describes fluid per vagina asClear. Some leg pain but not increased Blood pressure 121/61, pulse 72, temperature 98.1 F (36.7 C), temperature source Oral, resp. rate 18, height 5\' 7"  (1.702 m), weight 56 kg (123 lb 8 oz), last menstrual period 10/10/2016, SpO2 93 %.   Physical Examination: General appearance -alert, well appearing, and in no distress Heart - normal rate and regular rhythm Abdomen - soft, nontender, nondistended Fundal Height:size equals dates Cervical Exam:Not evaluated. Extremities:extremities normal, atraumatic, no cyanosis or edema and Homans sign is negative, no sign of DVT Membranes:ruptured  Fetal Monitoring: Fetal Heart Rate A  Mode External filed at 04/16/2017 1340  Baseline Rate (A) 145 bpm filed at 04/16/2017 1340  Variability 6-25 BPM filed at 04/16/2017 1340  Accelerations 10 x 10, 15 x 15 filed at 04/16/2017 1340  Decelerations None filed at 04/16/2017 1340        Labs: No results found for this or any previous visit (from the past 24 hour(s)).    Medications: Scheduled . aspirin EC 81 mg Oral Daily  . docusate sodium 100 mg Oral BID  . enoxaparin (LOVENOX) injection 40 mg Subcutaneous Q24H  . feeding supplement (ENSURE ENLIVE) 237 mL Oral BID BM  . folic acid 5 mg Oral Daily  . miconazole  Topical TID  . oxyCODONE 10 mg Oral Q4H  . prenatal multivitamin 1 tablet Oral Q1200  .  sodium chloride flush 3 mL Intravenous Q12H   I have reviewed the patient's current medications.  ASSESSMENT:     Patient Active Problem List   Diagnosis Date Noted  . Ringworm of body 04/05/2017  . Ulcer of right lower extremity ([place])   . Preterm premature rupture of membranes (PPROM) in second trimester, antepartum 03/20/2017  . History of preterm delivery, currently pregnant in second trimester 03/20/2017  . Umbilical vein abnormality affecting pregnancy (varix) 03/20/2017  . 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 Partial placenta previa - right lateral previa 02/21/2017  . Abnormal MSAFP (maternal serum alpha-fetoprotein), elevated 02/18/2017  . Subchorionic hematoma in second trimester 12/23/2016  . Supervision of high risk pregnancy, antepartum 12/16/2016  . History of pre-eclampsia 12/16/2016  . 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 Advanced maternal age in multigravida 07/28/2013  . Previous cesarean delivery x 2 07/22/2013  . Hemochromatosis 11/10/2012  . Chronic pain syndrome 10/13/2012  . Leukocytosis 03/30/2012    PLAN: Continue observation in hospital for PPROM. No evidence of sickle cell crisis Breech presentation, previous cesarean sections. Repeat cesarean if delivery is indicated  Woodroe Mode, MD 04/17/2017 11:04 AM

## 2017-04-18 NOTE — Progress Notes (Signed)
I offered spiritual and emotional support to pt.  She reports that she is doing okay, but that she misses her children.  She is very stressed that the woman who is watching her son this week is not available next week.  I tried to help her strategize about other potential options for childcare, but she came up with none.  When I asked her how she handles stressful situations like this one she said she prays about it.  I asked her if she would like to pray together or if she would like me to keep her family in my prayers and she stated that she would appreciate if I keep her family in prayer.  I assured her that I would do this.  I also brought her paper and markers if she wanted to make a card for her children because they are having a difficult time with being away from her, as she is their primary caregiver.  We will continue to check in on her as we are able, but please also page as needs arise.  [address] [place], bcc Pager, [phone] 3:13 PM    04/18/17 1500  Clinical Encounter Type  Visited With Patient  Visit Type Spiritual support

## 2017-04-18 NOTE — Progress Notes (Signed)
Patient ID: [name], female   DOB: 12-30-1970, 46 y.o.   MRN: [id number]   [place]) NOTE  [name] is a 46 y.o. [id number] at [redacted]w[redacted]d who is admitted for rupture of membranes, bleeding with placenta previa.   Fetal presentation is breech. Length of Stay:  29  Days  Subjective:  Patient reports the fetal movement as active. Patient reports uterine contraction  activity as none. Patient reports  vaginal bleeding as scant staining. Patient describes fluid per vagina as Clear.  Vitals:  Blood pressure 120/66, pulse 73, temperature 98.2 F (36.8 C), temperature source Oral, resp. rate 18, height 5\' 7"  (1.702 m), weight 123 lb 8 oz (56 kg), last menstrual period 10/10/2016, SpO2 93 %. Physical Examination:  General appearance - alert, well appearing, and in no distress Heart - normal rate and regular rhythm Abdomen - soft, nontender, nondistended Fundal Height:  size equals dates Cervical Exam: Not evaluated. . Extremities: extremities normal, atraumatic, no cyanosis or edema and Homans sign is negative, no sign of DVT Membranes:ruptured  Fetal Monitoring:    Fetal Heart Rate A  Mode External filed at 04/17/2017 1644  Baseline Rate (A) 145 bpm filed at 04/17/2017 1644  Variability <5 BPM filed at 04/17/2017 1644  Accelerations 10 x 10 filed at 04/17/2017 1644  Decelerations None filed at 04/17/2017 1644    Labs:  No results found for this or any previous visit (from the past 24 hour(s)).  Medications:  Scheduled . aspirin EC  81 mg Oral Daily  . docusate sodium  100 mg Oral BID  . enoxaparin (LOVENOX) injection  40 mg Subcutaneous Q24H  . feeding supplement (ENSURE ENLIVE)  237 mL Oral BID BM  . folic acid  5 mg Oral Daily  . miconazole   Topical TID  . oxyCODONE  10 mg Oral Q4H  . prenatal multivitamin  1 tablet Oral Q1200  . sodium chloride flush  3 mL Intravenous Q12H   I have reviewed the patient's current  medications.  ASSESSMENT: Patient Active Problem List   Diagnosis Date Noted  . Ringworm of body 04/05/2017  . Ulcer of right lower extremity ([place])   . Preterm premature rupture of membranes (PPROM)  in second trimester, antepartum 03/20/2017  . History of preterm delivery, currently pregnant in second trimester 03/20/2017  . Umbilical vein abnormality affecting pregnancy (varix) 03/20/2017  . 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 Partial placenta previa - right lateral previa 02/21/2017  . Abnormal MSAFP (maternal serum alpha-fetoprotein), elevated 02/18/2017  . Subchorionic hematoma in second trimester 12/23/2016  . Supervision of high risk pregnancy, antepartum 12/16/2016  . History of pre-eclampsia 12/16/2016  . 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) 11/09/2016  . Advanced maternal age in multigravida 07/28/2013  . Previous cesarean delivery x 2 07/22/2013  . Hemochromatosis 11/10/2012  . Chronic pain syndrome 10/13/2012  . Leukocytosis 03/30/2012    PLAN: Continue present management for PPSROM  [name] 04/18/2017,10:16 AM

## 2017-04-19 LAB — WET PREP, GENITAL
Clue Cells Wet Prep HPF POC: NONE SEEN
SPERM: NONE SEEN
Trich, Wet Prep: NONE SEEN
Yeast Wet Prep HPF POC: NONE SEEN

## 2017-04-19 LAB — CBC
HCT: 23.2 % — ABNORMAL LOW (ref 36.0–46.0)
Hemoglobin: 8.3 g/dL — ABNORMAL LOW (ref 12.0–15.0)
MCH: 31.1 pg (ref 26.0–34.0)
MCHC: 35.8 g/dL (ref 30.0–36.0)
MCV: 86.9 fL (ref 78.0–100.0)
Platelets: 355 10*3/uL (ref 150–400)
RBC: 2.67 MIL/uL — ABNORMAL LOW (ref 3.87–5.11)
RDW: 24.3 % — AB (ref 11.5–15.5)
WBC: 13.5 10*3/uL — ABNORMAL HIGH (ref 4.0–10.5)

## 2017-04-19 MED ORDER — LACTATED RINGERS IV SOLN
INTRAVENOUS | Status: DC
  Administered 2017-04-19 – 2017-04-20 (×2): via INTRAVENOUS

## 2017-04-19 NOTE — Progress Notes (Signed)
Faculty Note  Asked by RN to evaluate patient for some increased discharge.   Patient feeling well, denies contractions, reports bleeding is about the same, leaking is about the same. Reports normal fetal movement.  Patient does affirm she wants a BTL done at the time of her CS. She again verbalizes understanding that this is an irreversible decision and she will not be able to have children after this is done. She states she wants this done no matter what, even if she has to go under general anesthesia.   BP 129/76 (BP Location: Left Arm)   Pulse 92   Temp 98 F (36.7 C) (Oral)   Resp 16   Ht 5\' 7"  (1.702 m)   Wt 123 lb 8 oz (56 kg)   LMP 10/10/2016 Comment: patient double shielded  SpO2 92%   BMI 19.34 kg/m   Gen: alert, oriented ABd: soft, non-tender SSE: closed cervix, small clear fluid in vagina with small amount of blood, some chunks of discharge noted on exam, almost with appearance of yeast No digital exam done  FHR: 140s, moderate variability Toco: no ctx   A/P: 46 yo [id number] @ [redacted]w[redacted]d admitted for PPROM at 23 weeks. With h/o 2 CS, right lateral previa, sickle cell crisis, anemia, breech presentation, s/p BTMZ x2 courses. She is having some discharge tonight that is new, reports bleeding about the same. FHR on monitor appropriate for GA. Will keep on monitor for now and patient to be NPO. Last H/H 7/21, will repeat CBC now. T&S up to date.   CBC Desires BTL, papers signed 11/6 Wet prep sent NPO for now Cont EFM Cont Vivia Birmingham, M.D. Attending [place], [facility] for [employer], [place]

## 2017-04-19 NOTE — Progress Notes (Signed)
   04/19/17 2014  Vitals  Temp 98.3 F (36.8 C)  Temp Source Oral  BP 127/76  BP Location Left Arm  BP Method Automatic  Patient Position (if appropriate) Supine  Pulse Rate 90  Pulse Rate Source Monitor  Resp 16  Oxygen Therapy  SpO2 91 %  O2 Device Room Air  Pain Assessment  Pain Score 0  2nd Pain Site  Pain Score 7  Pain Location Leg  Pain Orientation Right  Pain Descriptors / Indicators Aching  Pain Intervention(s) Heat applied  Pain Frequency Constant  Patient's Stated Pain Goal 5  Pain Onset On-going  Pt awake, alert and oriented x 4. Pt c/o right leg pain 7/10, Dilaudid 1 mg IV administered. 02 sat 97% R/A, 02 @2LNP  initiated sat 94%. We will continue to monitor.

## 2017-04-19 NOTE — Progress Notes (Addendum)
2014: Patient remained NPO, order received for LR @ 75 ml/hr, pt made aware infusion initiated.  2109:  Frequent UI noted on tracing, pt denies abdominal pain or contraction. LR increased to 127mls/hr as per MD's order. We will continue to monitor.  2230: Dr Rosana Hoes on unit and review strip and adjusted FHM and Toco.

## 2017-04-19 NOTE — Progress Notes (Signed)
Patient ID: [name], female   DOB: 1971-02-04, 46 y.o.   MRN: [id number] [place] COMPREHENSIVE PROGRESS NOTE  [name] is a 46 y.o. [id number] at [redacted]w[redacted]d  who is admitted for PROM.   Fetal presentation is breech. Length of Stay:  30  Days  Subjective: Pt without complaints this morning. Denies any VB. Reports + FM. Tolerating diet.   Vitals:  Blood pressure 139/72, pulse 81, temperature 98 F (36.7 C), temperature source Oral, resp. rate 16, height 5\' 7"  (1.702 m), weight 56 kg (123 lb 8 oz), last menstrual period 10/10/2016, SpO2 93 %.   Physical Examination: Lungs clear Heart RRR Abd soft + BS gravid non tender Ext non tender  {Fetal Monitoring:  130-140's, reactive for gestational age  Labs:  No results found for this or any previous visit (from the past 50 hour(s)).  Imaging Studies:    none   Medications:  Scheduled . aspirin EC  81 mg Oral Daily  . docusate sodium  100 mg Oral BID  . enoxaparin (LOVENOX) injection  40 mg Subcutaneous Q24H  . feeding supplement (ENSURE ENLIVE)  237 mL Oral BID BM  . folic acid  5 mg Oral Daily  . miconazole   Topical TID  . oxyCODONE  10 mg Oral Q4H  . prenatal multivitamin  1 tablet Oral Q1200  . sodium chloride flush  3 mL Intravenous Q12H   I have reviewed the patient's current medications.  ASSESSMENT: Patient Active Problem List   Diagnosis Date Noted  . Ringworm of body 04/05/2017  . Ulcer of right lower extremity ([place])   . Preterm premature rupture of membranes (PPROM)  in second trimester, antepartum 03/20/2017  . History of preterm delivery, currently pregnant in second trimester 03/20/2017  . Umbilical vein abnormality affecting pregnancy (varix) 03/20/2017  . 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 Partial placenta previa - right lateral previa 02/21/2017  . Abnormal MSAFP (maternal serum alpha-fetoprotein), elevated 02/18/2017  . Subchorionic hematoma in second trimester  12/23/2016  . Supervision of high risk pregnancy, antepartum 12/16/2016  . History of pre-eclampsia 12/16/2016  . 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 Advanced maternal age in multigravida 07/28/2013  . Previous cesarean delivery x 2 07/22/2013  . Hemochromatosis 11/10/2012  . Chronic pain syndrome 10/13/2012  . Leukocytosis 03/30/2012    PLAN: Stable No S/Sx of infection Continue routine antenatal care.   [name] 04/19/2017,7:36 AM

## 2017-04-20 LAB — TYPE AND SCREEN
ABO/RH(D): B POS
ANTIBODY SCREEN: NEGATIVE

## 2017-04-20 NOTE — Progress Notes (Signed)
[place] NOTE  [name] is a 46 y.o. [id number] at [redacted]w[redacted]d who is admitted for PPROM, [place] crisis, with right lateral previa, breech position.  Estimated Date of Delivery: 07/17/17 Fetal presentation is breech.  Length of Stay:  31 Days. Admitted 03/20/2017  Subjective:  Patient reports normal fetal movement.  She denies uterine contractions, reports bleeding is the same, reports same amount of leaking.  Vitals:  Blood pressure 118/68, pulse 81, temperature 98.1 F (36.7 C), temperature source Oral, resp. rate 18, height 5\' 7"  (1.702 m), weight 123 lb 8 oz (56 kg), last menstrual period 10/10/2016, SpO2 95 %. Physical Examination: CONSTITUTIONAL: Well-developed, well-nourished female in no acute distress.  HENT:  Normocephalic, atraumatic, External right and left ear normal. Oropharynx is clear and moist EYES: Conjunctivae and EOM are normal. Pupils are equal, round, and reactive to light. No scleral icterus.  NECK: Normal range of motion, supple, no masses. SKIN: Skin is warm and dry. No rash noted. Not diaphoretic. No erythema. No pallor. [place]: Alert and oriented to person, place, and time. Normal reflexes, muscle tone coordination. No cranial nerve deficit noted. PSYCHIATRIC: Normal mood and affect. Normal behavior. Normal judgment and thought content. CARDIOVASCULAR: Normal heart rate noted, regular rhythm RESPIRATORY: Effort and breath sounds normal, no problems with respiration noted MUSCULOSKELETAL: Normal range of motion. No edema and no tenderness. ABDOMEN: Soft, nontender, nondistended, gravid. CERVIX:  deferred  Fetal monitoring: FHR: 130 bpm, Variability: moderate, Accelerations: Present, Decelerations: Absent  Uterine activity: no contractions per hour  Results for orders placed or performed during the hospital encounter of 03/20/17 (from the past 48 hour(s))  CBC     Status: Abnormal   Collection Time: 04/19/17  6:05 PM  Result Value Ref  Range   WBC 13.5 (H) 4.0 - 10.5 K/uL    Comment: ADJUSTED FOR NUCLEATED RBC'S   RBC 2.67 (L) 3.87 - 5.11 MIL/uL   Hemoglobin 8.3 (L) 12.0 - 15.0 g/dL   HCT 23.2 (L) 36.0 - 46.0 %   MCV 86.9 78.0 - 100.0 fL   MCH 31.1 26.0 - 34.0 pg   MCHC 35.8 30.0 - 36.0 g/dL   RDW 24.3 (H) 11.5 - 15.5 %   Platelets 355 150 - 400 K/uL    Comment: PLATELET COUNT CONFIRMED BY SMEAR  Wet prep, genital     Status: Abnormal   Collection Time: 04/19/17  6:30 PM  Result Value Ref Range   Yeast Wet Prep HPF POC NONE SEEN NONE SEEN   Trich, Wet Prep NONE SEEN NONE SEEN   Clue Cells Wet Prep HPF POC NONE SEEN NONE SEEN   WBC, Wet Prep HPF POC FEW (A) NONE SEEN    Comment: FEW BACTERIA SEEN   Sperm NONE SEEN   Type and screen [place]     Status: None   Collection Time: 04/20/17  5:30 AM  Result Value Ref Range   ABO/RH(D) B POS    Antibody Screen NEG    Sample Expiration 04/23/2017     No results found.  Current scheduled medications . aspirin EC  81 mg Oral Daily  . docusate sodium  100 mg Oral BID  . enoxaparin (LOVENOX) injection  40 mg Subcutaneous Q24H  . feeding supplement (ENSURE ENLIVE)  237 mL Oral BID BM  . folic acid  5 mg Oral Daily  . miconazole   Topical TID  . oxyCODONE  10 mg Oral Q4H  . prenatal multivitamin  1 tablet Oral  Q1200  . sodium chloride flush  3 mL Intravenous Q12H    I have reviewed the patient's current medications.  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 Denies contractions this am and none seen on monitor. Will let eat and go back to TID monitoring. To call with any issues.   Continue routine antenatal care.   Feliz Beam, M.D. Attending [place], [facility] for [employer], [place]

## 2017-04-21 ENCOUNTER — Inpatient Hospital Stay (HOSPITAL_COMMUNITY): Payer: Medicaid Other

## 2017-04-21 DIAGNOSIS — Z3A25 25 weeks gestation of pregnancy: Secondary | ICD-10-CM

## 2017-04-21 LAB — GLUCOSE TOLERANCE, 1 HOUR: GLUCOSE 1 HOUR GTT: 97 mg/dL (ref 70–140)

## 2017-04-21 NOTE — Progress Notes (Addendum)
   04/21/17 0100  Vital Signs  BP 119/74  BP Location Left Arm  Patient Position (if appropriate) Semi-fowlers  BP Method Automatic  Pulse Rate 85  Pulse Rate Source Monitor  Resp 14  Temp 98 F (36.7 C)  Temp Source Oral  Oxygen Therapy  SpO2 (!) 87 %  O2 Device Room Air  02@2LNP  initiated, 02 sat increase 93-95%. Incentive Spirometry 750. We will continue to monitor.  2037: Incentive Spirometry initiated resulted 500... Goal 750  0100: Incentive Spirometry resulted 750... Goal 1000  0533: Pt somnolent, denies pain at this time. When asked if she needed pain medication, pt nodded her head "no". 0530 hrs Oxycodone 10 mg po held. Pt was instructed to call for pain medication when she needed it. We will continue to monitor.

## 2017-04-21 NOTE — Progress Notes (Signed)
Patient ID: [name], female   DOB: Jul 21, 1970, 46 y.o.   MRN: [id number]  [place]) NOTE  [name] is a 46 y.o. [id number] at [redacted]w[redacted]d by best clinical estimate who is admitted for PROM, bleeding previa.   Fetal presentation is breech. Length of Stay:  32  Days  Subjective: Still with minimal bleeding and LOF. No contractions. Has chronic sickle cell pain, controlled on current regimen. Patient reports the fetal movement as active. Patient reports uterine contraction  activity as none. Patient reports  vaginal bleeding as scant staining. Patient describes fluid per vagina as None.  Vitals:  Blood pressure 116/71, pulse 81, temperature 98 F (36.7 C), temperature source Oral, resp. rate 17, height 5\' 7"  (1.702 m), weight 123 lb 8 oz (56 kg), last menstrual period 10/10/2016, SpO2 91 %. Physical Examination:  General appearance - chronically ill appearing and thin female in NAD Chest - normal effort Abdomen - gravid, non-tender Fundal Height:  size equals dates Extremities: edema in feet and venous stasis dermatitis noted  Membranes:ruptured, blood stained fluid  Fetal Monitoring:  Baseline: 145 bpm, Variability: Good {> 6 bpm), Accelerations: Non-reactive but appropriate for gestational age and Decelerations: Variable: moderate  Medications:  Scheduled . aspirin EC  81 mg Oral Daily  . docusate sodium  100 mg Oral BID  . enoxaparin (LOVENOX) injection  40 mg Subcutaneous Q24H  . feeding supplement (ENSURE ENLIVE)  237 mL Oral BID BM  . folic acid  5 mg Oral Daily  . miconazole   Topical TID  . oxyCODONE  10 mg Oral Q4H  . prenatal multivitamin  1 tablet Oral Q1200  . sodium chloride flush  3 mL Intravenous Q12H   I have reviewed the patient's current medications.  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  For RCS with BTL Continue oxycodone as scheduled, has prn Dilaudid if needed On lovenox and folic acid--continue these Continue ASA Repeat u/s for growth this week  Donnamae Jude, MD 04/21/2017,11:48 AM

## 2017-04-21 NOTE — Progress Notes (Signed)
Dr. Rip Harbour on phone notified of pt fhr variables, dr. Rip Harbour reviewed strip can take pt off monitor.

## 2017-04-21 NOTE — Progress Notes (Signed)
Dr. Rip Harbour in [place] will talk with him about strip when he is done with surgery

## 2017-04-22 NOTE — Plan of Care (Signed)
  Progressing Nutrition, Less Than Body Requirements Adequate nutrition will be maintained 04/22/2017 0242 - Progressing by Ledell Noss, RN Note Pt is ordering off of menu.

## 2017-04-22 NOTE — Progress Notes (Signed)
Patient ID: [name], female   DOB: 08-05-1970, 46 y.o.   MRN: [id number] [place]) NOTE  [name] is a 46 y.o. [id number] at [redacted]w[redacted]d by best clinical estimate who is admitted for PROM.   Fetal presentation is breech. Length of Stay:  33  Days  Subjective: Denies significant bleeding Patient reports the fetal movement as active. Patient reports uterine contraction  activity as none. Patient reports  vaginal bleeding as scant staining. Patient describes fluid per vagina as Other blood stained.  Vitals:  Blood pressure (!) 115/55, pulse 79, temperature 98.2 F (36.8 C), temperature source Oral, resp. rate 18, height 5\' 7"  (1.702 m), weight 123 lb 8 oz (56 kg), last menstrual period 10/10/2016, SpO2 93 %. Physical Examination:  General appearance - alert, well appearing, and in no distress Chest - normal effort Abdomen - gravid, NT Fundal Height:  size equals dates Extremities: Homans sign is negative, no sign of DVT  Membranes:ruptured, clear fluid  Fetal Monitoring:  Baseline: 150 bpm, Variability: Good {> 6 bpm), Accelerations: Non-reactive but appropriate for gestational age and Decelerations: Variable: moderate  Labs:  Results for orders placed or performed during the hospital encounter of 03/20/17 (from the past 24 hour(s))  Glucose tolerance, 1 hour   Collection Time: 04/21/17  2:08 PM  Result Value Ref Range   Glucose, 1 Hour GTT 97 70 - 140 mg/dL    Medications:  Scheduled . aspirin EC  81 mg Oral Daily  . docusate sodium  100 mg Oral BID  . enoxaparin (LOVENOX) injection  40 mg Subcutaneous Q24H  . feeding supplement (ENSURE ENLIVE)  237 mL Oral BID BM  . folic acid  5 mg Oral Daily  . miconazole   Topical TID  . oxyCODONE  10 mg Oral Q4H  . prenatal multivitamin  1 tablet Oral Q1200  . sodium chloride flush  3 mL Intravenous Q12H   I have reviewed the patient's current medications.  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 No longer previa UVV? Will discuss with MFM  Passed 1 hour glucola yesterday Pain is well controlled on standard opiate dosing T & S q 3 days (neg AB)  [name] Jude, MD 04/22/2017,11:12 AM

## 2017-04-23 LAB — TYPE AND SCREEN
ABO/RH(D): B POS
ANTIBODY SCREEN: NEGATIVE

## 2017-04-23 NOTE — Progress Notes (Signed)
Patient had a few small clots on her pad and in the toilet.  The were the size of a marble.  I will place her on the monitor to assess baby.

## 2017-04-23 NOTE — Progress Notes (Signed)
Pt's main concern is that they have no one to care for their children while she is in the hospital.  Her husband is missing work taking care of them and has already received a warning from work.  I contacted the [facility] Nurse [facility] at [facility] and received information about [facility] which offers referrals for childcare and other referrals.  With pt's permission, I put her in touch with Myrene Buddy, [phone] who will run a search for childcare options that meet their criteria.  I also looked into the subsidy for childcare that DSS offers for families in crisis and brought her information on that as well.  She also is still pursuing bringing her aunt here from [place] to help with her children and I gave her contact information for [employer].    [place], [place] Pager, [phone] 4:53 PM    04/23/17 1600  Clinical Encounter Type  Visited With Patient  Visit Type Follow-up;Spiritual support

## 2017-04-23 NOTE — Progress Notes (Signed)
Patient ID: Khila Papp, female   DOB: 1970-08-13, 46 y.o.   MRN: [id number] [place]) NOTE  Deavion Strider is a 46 y.o. [id number] at [redacted]w[redacted]d by best clinical estimate who is admitted for PROM.   Fetal presentation is breech. Length of Stay:  34  Days  Subjective: Doing well. No new complaints Patient reports the fetal movement as active. Patient reports uterine contraction  activity as none. Patient reports  vaginal bleeding as scant staining. Patient describes fluid per vagina as Other bloody.  Vitals:  Blood pressure 116/71, pulse 73, temperature 98.2 F (36.8 C), temperature source Oral, resp. rate 16, height 5\' 7"  (1.702 m), weight 127 lb 0.4 oz (57.6 kg), last menstrual period 10/10/2016, SpO2 92 %. Physical Examination:  General appearance - chronically ill appearing Chest - normal effort Abdomen - gravid, NT, umbilical hernia soft and easily reducible Fundal Height:  size equals dates Extremities: Homans sign is negative, no sign of DVT  Membranes:intact  Fetal Monitoring:  Baseline: 140 bpm, Variability: Good {> 6 bpm), Accelerations: Reactive and Decelerations: Absent  Labs:  Results for orders placed or performed during the hospital encounter of 03/20/17 (from the past 24 hour(s))  Type and screen [place]   Collection Time: 04/23/17  5:53 AM  Result Value Ref Range   ABO/RH(D) B POS    Antibody Screen NEG    Sample Expiration 04/26/2017     Medications:  Scheduled . aspirin EC  81 mg Oral Daily  . docusate sodium  100 mg Oral BID  . enoxaparin (LOVENOX) injection  40 mg Subcutaneous Q24H  . feeding supplement (ENSURE ENLIVE)  237 mL Oral BID BM  . folic acid  5 mg Oral Daily  . miconazole   Topical TID  . oxyCODONE  10 mg Oral Q4H  . prenatal multivitamin  1 tablet Oral Q1200  . sodium chloride flush  3 mL Intravenous Q12H   I have reviewed the patient's current medications.  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

## 2017-04-24 NOTE — Progress Notes (Signed)
Patient ID: Libni Fusaro, female   DOB: August 13, 1970, 46 y.o.   MRN: [id number] [place]) NOTE  Jermany Sundell is a 46 y.o. [id number] at [redacted]w[redacted]d by early ultrasound who is admitted for PROM.   Fetal presentation is breech. Length of Stay:  35  Days  Subjective: Patient denies any new problems Patient reports the fetal movement as active. Patient reports uterine contraction  activity as none. Patient reports  vaginal bleeding as scant staining. Patient describes fluid per vagina as Other bloody.  Vitals:  Blood pressure 130/68, pulse 87, temperature 98 F (36.7 C), temperature source Oral, resp. rate 18, height 5\' 7"  (1.702 m), weight 128 lb 1.9 oz (58.1 kg), last menstrual period 10/10/2016, SpO2 91 %. Physical Examination:  General appearance - alert, well appearing, and in no distress Chest - normal effort Abdomen - gravid, minimal tenderness Fundal Height:  size equals dates Extremities: Homans sign is negative, no sign of DVT  Membranes:ruptured  Fetal Monitoring:  Baseline: 145 bpm, Variability: Good {> 6 bpm), Accelerations: Non-reactive but appropriate for gestational age and Decelerations: Absent   Medications:  Scheduled . aspirin EC  81 mg Oral Daily  . docusate sodium  100 mg Oral BID  . enoxaparin (LOVENOX) injection  40 mg Subcutaneous Q24H  . feeding supplement (ENSURE ENLIVE)  237 mL Oral BID BM  . folic acid  5 mg Oral Daily  . miconazole   Topical TID  . oxyCODONE  10 mg Oral Q4H  . prenatal multivitamin  1 tablet Oral Q1200  . sodium chloride flush  3 mL Intravenous Q12H   I have reviewed the patient's current medications.  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)   Abnormal MSAFP  (maternal serum alpha-fetoprotein), elevated   Partial placenta previa - right lateral previa   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[place])   History of preterm delivery, currently pregnant in second trimester   Umbilical vein abnormality affecting pregnancy (varix)   Ulcer of right lower extremity (HCC)   Ringworm of body   PLAN: Continue inpatient monitoring Weekly eval for UY+VV Placenta previa is likely resolved according to MFM Delivery with s/sx's of chorio  Donnamae Jude, MD 04/24/2017,1:36 PM

## 2017-04-24 NOTE — Plan of Care (Signed)
  Skin Integrity: Risk for impaired skin integrity will decrease 04/24/2017 0053 - Progressing by Janae Sauce, RN Note Patient has not had severe skin breakdown during admission.   Nutrition, Less Than Body Requirements Adequate nutrition will be maintained 04/24/2017 0053 - Progressing by Janae Sauce, RN Note Patient is encouraged to eat and has opportunity to request foods and beverages of her choice.

## 2017-04-25 NOTE — Progress Notes (Signed)
I offered follow-up support to [facility] and spent some time looking up resources for childcare with her.  She spoke with Myrene Buddy from [name] who will send her some referrals that are close to her home and that meet the hours of childcare she needs; however there is unlikely to be any financial assistance from any of the locations.  I encouraged her to reach out to them anyway to see if they might be able to work with her family on a sliding scale.    I also spoke with an individual from the [facility] about the possibility of bringing over a relative from [place].  She would need to apply for [employer] with the Department for [employer].  The application fee is $543 and may not be able to be processed in a timely or successful manner.  She is discouraged, but does have a plan for childcare for next week if nothing else works out.  [place], [place] Pager, [phone] 3:05 PM     04/25/17 1500  Clinical Encounter Type  Visited With Patient  Visit Type Spiritual support

## 2017-04-25 NOTE — Progress Notes (Signed)
Patient ID: [name], female   DOB: 1970/08/25, 46 y.o.   MRN: [id number] [place]) NOTE  [name] is a 46 y.o. [id number] at [redacted]w[redacted]d by best clinical estimate who is admitted for PROM.   Fetal presentation is breech. Length of Stay:  36  Days  Subjective: Feels well Patient reports the fetal movement as active. Patient reports uterine contraction  activity as none. Patient reports  vaginal bleeding as none. Patient describes fluid per vagina as None.  Vitals:  Blood pressure 126/72, pulse 89, temperature 98.4 F (36.9 C), temperature source Oral, resp. rate 16, height 5\' 7"  (1.702 m), weight 127 lb (57.6 kg), last menstrual period 10/10/2016, SpO2 93 %. Physical Examination:  General appearance - alert, well appearing, and in no distress Chest - normal effort Abdomen - gravid, minimal fundal tenderness Fundal Height:  size equals dates Extremities: Homans sign is negative, no sign of DVT  Membranes:intact  Fetal Monitoring:  Baseline: 145 bpm, Variability: Good {> 6 bpm), Accelerations: Non-reactive but appropriate for gestational age and Decelerations: Absent  Medications:  Scheduled . aspirin EC  81 mg Oral Daily  . docusate sodium  100 mg Oral BID  . enoxaparin (LOVENOX) injection  40 mg Subcutaneous Q24H  . feeding supplement (ENSURE ENLIVE)  237 mL Oral BID BM  . folic acid  5 mg Oral Daily  . miconazole   Topical TID  . oxyCODONE  10 mg Oral Q4H  . prenatal multivitamin  1 tablet Oral Q1200  . sodium chloride flush  3 mL Intravenous Q12H   I have reviewed the patient's current medications.  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

## 2017-04-26 NOTE — Progress Notes (Signed)
Patient ID: [name], female   DOB: [birth year], 46 y.o.   MRN: [id number]  [place]) NOTE  [name] is a 46 y.o. [id number] at [redacted]w[redacted]d who is admitted for rupture of membranes, placenta previa.   Fetal presentation is breech. Length of Stay:  37  Days  Subjective:  Patient reports the fetal movement as active. Patient reports uterine contraction  activity as none. Patient reports  vaginal bleeding as none. Patient describes fluid per vagina as Clear.  Vitals:  Blood pressure (!) 117/55, pulse 81, temperature 98.2 F (36.8 C), temperature source Oral, resp. rate 18, height 5\' 7"  (1.702 m), weight 127 lb (57.6 kg), last menstrual period 10/10/2016, SpO2 91 %. Physical Examination:  General appearance - alert, well appearing, and in no distress Heart - normal rate and regular rhythm Abdomen - soft, nontender, nondistended Fundal Height:  size equals dates Cervical Exam: Not evaluated. and found to be not evaluated/ Extremities: extremities normal, atraumatic, no cyanosis or edema and Homans sign is negative, no sign of DVT  Membranes:ruptured  Fetal Monitoring:     Fetal Heart Rate A  Mode External  [removed] filed at 04/25/2017 2335  Baseline Rate (A) 140 bpm filed at 04/25/2017 2335  Variability <5 BPM filed at 04/25/2017 2335  Accelerations 10 x 10 filed at 04/25/2017 2335  Decelerations None filed at 04/25/2017 2335     Labs:  No results found for this or any previous visit (from the past 24 hour(s)).    Medications:  Scheduled . aspirin EC  81 mg Oral Daily  . docusate sodium  100 mg Oral BID  . enoxaparin (LOVENOX) injection  40 mg Subcutaneous Q24H  . feeding supplement (ENSURE ENLIVE)  237 mL Oral BID BM  . folic acid  5 mg Oral Daily  . miconazole   Topical TID  . oxyCODONE  10 mg Oral Q4H  . prenatal multivitamin  1 tablet Oral Q1200  . sodium chloride flush  3 mL Intravenous Q12H   I have reviewed the patient's current  medications.  ASSESSMENT: Patient Active Problem List   Diagnosis Date Noted  . Ringworm of body 04/05/2017  . Ulcer of right lower extremity ([place])   . Preterm premature rupture of membranes (PPROM)  in second trimester, antepartum 03/20/2017  . History of preterm delivery, currently pregnant in second trimester 03/20/2017  . Umbilical vein abnormality affecting pregnancy (varix) 03/20/2017  . 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 Partial placenta previa - right lateral previa 02/21/2017  . Abnormal MSAFP (maternal serum alpha-fetoprotein), elevated 02/18/2017  . Subchorionic hematoma in second trimester 12/23/2016  . Supervision of high risk pregnancy, antepartum 12/16/2016  . History of pre-eclampsia 12/16/2016  . 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 Advanced maternal age in multigravida 07/28/2013  . Previous cesarean delivery x 2 07/22/2013  . Hemochromatosis 11/10/2012  . Chronic pain syndrome 10/13/2012  . Leukocytosis 03/30/2012    PLAN:Continue inpatient management  Delivery with s/sx's of infection or worsening fetal/maternal status Pain control for SS as prescribed   [name] 04/26/2017,7:22 AM

## 2017-04-27 MED ORDER — SENNOSIDES-DOCUSATE SODIUM 8.6-50 MG PO TABS
1.0000 | ORAL_TABLET | Freq: Two times a day (BID) | ORAL | Status: DC
  Administered 2017-04-27 – 2017-05-04 (×15): 1 via ORAL
  Filled 2017-04-27 (×15): qty 1

## 2017-04-27 MED ORDER — OXYTOCIN 10 UNIT/ML IJ SOLN
INTRAMUSCULAR | Status: AC
  Filled 2017-04-27: qty 1

## 2017-04-27 NOTE — Progress Notes (Signed)
Patient ID: Naydeen Speirs, female   DOB: 1971-03-10, 46 y.o.   MRN: [id number] [place]) NOTE  Deltha Bernales is a 46 y.o. [id number] at [redacted]w[redacted]d by best clinical estimate who is admitted for PROM.   Fetal presentation is breech. Length of Stay:  38  Days  Subjective: Feels well--has usual pain in her legs controlled on her current pain regimen No BM x 3 days Patient reports the fetal movement as active. Patient reports uterine contraction  activity as none. Patient reports  vaginal bleeding as scant staining. Patient describes fluid per vagina as Other bloody.  Vitals:  Blood pressure 111/71, pulse 79, temperature 98.5 F (36.9 C), temperature source Oral, resp. rate 18, height 5\' 7"  (1.702 m), weight 127 lb 12 oz (57.9 kg), last menstrual period 10/10/2016, SpO2 90 %. Physical Examination:  General appearance - alert, well appearing, and in no distress Chest - normal effort Abdomen - gravid, non-tender Fundal Height:  size equals dates Extremities: Homans sign is negative, no sign of DVT chronic venous stasis changes noted Membranes:intact  Fetal Monitoring:  Baseline: 135 bpm, Variability: Good {> 6 bpm), Accelerations: Reactive and Decelerations: Absent  Medications:  Scheduled . aspirin EC  81 mg Oral Daily  . docusate sodium  100 mg Oral BID  . enoxaparin (LOVENOX) injection  40 mg Subcutaneous Q24H  . feeding supplement (ENSURE ENLIVE)  237 mL Oral BID BM  . folic acid  5 mg Oral Daily  . miconazole   Topical TID  . oxyCODONE  10 mg Oral Q4H  . oxytocin      . prenatal multivitamin  1 tablet Oral Q1200  . sodium chloride flush  3 mL Intravenous Q12H   I have reviewed the patient's current medications.  ASSESSMENT: Principal Problem:   Preterm premature rupture of membranes (PPROM)  in second trimester, antepartum Active Problems:   Previous cesarean delivery x 2   Advanced maternal age in multigravida   Chronic pain syndrome   Sickle  cell anemia of mother during pregnancy ([place])   Abnormal MSAFP (maternal serum alpha-fetoprotein), elevated   Partial placenta previa - right lateral previa   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[place])   History of preterm delivery, currently pregnant in second trimester   Umbilical vein abnormality affecting pregnancy (varix)   Ulcer of right lower extremity (HCC)   Ringworm of body   PLAN: Continue inpatient management Delivery with s/sx's of infection F/u u/s tomorrow to look at placental placement and UVV Begin Peri-colace for OIC  Donnamae Jude, MD 04/27/2017,11:37 AM

## 2017-04-28 ENCOUNTER — Inpatient Hospital Stay (HOSPITAL_COMMUNITY): Payer: Medicaid Other

## 2017-04-28 DIAGNOSIS — G894 Chronic pain syndrome: Secondary | ICD-10-CM

## 2017-04-28 DIAGNOSIS — O358XX0 Maternal care for other (suspected) fetal abnormality and damage, not applicable or unspecified: Secondary | ICD-10-CM

## 2017-04-28 DIAGNOSIS — Z3A28 28 weeks gestation of pregnancy: Secondary | ICD-10-CM

## 2017-04-28 DIAGNOSIS — O34219 Maternal care for unspecified type scar from previous cesarean delivery: Secondary | ICD-10-CM

## 2017-04-28 NOTE — Progress Notes (Addendum)
Patient ID: [name], female   DOB: 06-15-1970, 46 y.o.   MRN: [id number]  [place] ANTEPARTUM NOTE  [name] is a 46 y.o. [id number] at [redacted]w[redacted]d  who is admitted for PPROM.   Fetal presentation is breech. Length of Stay:  39  Days  Subjective: Pt without complaint. No fevers, chills, abdominal pain. Patient reports good fetal movement.   She reports no uterine contractions She reports no bleeding  She reports continued loss of fluid per vagina.  Vitals:  Blood pressure 128/78, pulse 82, temperature 97.9 F (36.6 C), temperature source Oral, resp. rate 16, height 5\' 7"  (1.702 m), weight 128 lb 4 oz (58.2 kg), last menstrual period 10/10/2016, SpO2 91 %. Physical Examination:  General appearance - alert, well appearing, and in no distress Chest - clear to auscultation, no wheezes, rales or rhonchi, symmetric air entry Heart - normal rate, regular rhythm, normal S1, S2, no murmurs, rubs, clicks or gallops Abdomen - soft, nontender, nondistended, no masses or organomegaly Fundal Height:  size less than dates Extremities: extremities normal, atraumatic, no cyanosis or edema and Homans sign is negative, no sign of DVT Membranes: ruptured  Fetal Monitoring:  Baseline: 130-135 bpm, Variability: Good {> 6 bpm), Accelerations: Reactive and Decelerations: Absent x3  Labs:  No results found for this or any previous visit (from the past 24 hour(s)).  Imaging Studies:      Medications:  Scheduled . aspirin EC  81 mg Oral Daily  . enoxaparin (LOVENOX) injection  40 mg Subcutaneous Q24H  . feeding supplement (ENSURE ENLIVE)  237 mL Oral BID BM  . folic acid  5 mg Oral Daily  . miconazole   Topical TID  . oxyCODONE  10 mg Oral Q4H  . prenatal multivitamin  1 tablet Oral Q1200  . senna-docusate  1 tablet Oral BID  . sodium chloride flush  3 mL Intravenous Q12H   I have reviewed the patient's current medications.  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 PPROM  S/p BMZ, latency antibiotics  No evidence of infection  Continue expectant management  NST reactive x3 2. Previous C/S x2  RLTCS and BTL 3. Sickle Cell anemia  Compensated for now. 4. Placenta previa  Appears resolved on [place] today. 5. Umbilical vein varix  Appears resolved by [place] today.   Continue routine antenatal care.   Truett Mainland, DO 04/28/2017,1:12 PM

## 2017-04-28 NOTE — Progress Notes (Signed)
CSW spoke with a representative from [employer] Marshell Levan) regarding assisting MOB with having family travel here from [place]. CSW was informed that there is no little to no help from [employer] with assisting MOB with getting her family to the [place].  [employer] had no additional suggestion for CSW.    Laurey Arrow, MSW, LCSW Clinical Social Work [phone]

## 2017-04-29 DIAGNOSIS — O280 Abnormal hematological finding on antenatal screening of mother: Secondary | ICD-10-CM

## 2017-04-29 LAB — TYPE AND SCREEN
ABO/RH(D): B POS
ANTIBODY SCREEN: NEGATIVE

## 2017-04-29 MED ORDER — POLYETHYLENE GLYCOL 3350 17 G PO PACK
17.0000 g | PACK | Freq: Every day | ORAL | Status: DC | PRN
  Administered 2017-04-29 – 2017-05-04 (×2): 17 g via ORAL
  Filled 2017-04-29 (×2): qty 1

## 2017-04-29 NOTE — Progress Notes (Signed)
Patient ID: [name], female   DOB: [birth year], 46 y.o.   MRN: [id number]  [place] ANTEPARTUM NOTE  [name] is a 46 y.o. [id number] at [redacted]w[redacted]d  who is admitted for PPROM.   Fetal presentation is breech. Length of Stay:  40  Days  Subjective: Patient complains of constipation times 4-5 days.  She continues to have no fevers, chills, nausea, vomiting, abdominal pain.   Patient reports good fetal movement.   She reports no uterine contractions She reports no bleeding  She reports continued loss of fluid per vagina.  Vitals:  Blood pressure 124/74, pulse 75, temperature 98.2 F (36.8 C), temperature source Oral, resp. rate 18, height 5\' 7"  (1.702 m), weight 128 lb 12.8 oz (58.4 kg), last menstrual period 10/10/2016, SpO2 93 %. Physical Examination:  General -awake, alert, well-appearing.  No acute cardiopulmonary distress. Chest -clear to auscultation bilaterally with no wheezes, rales, or rhonchi.  Good respiratory effort with no accessory muscle use. Heart -regular rate with normal S1-S2 sounds.  No murmurs, rubs, gallops.  Abdomen -soft, nontender, nondistended.  Gravid Fundal Height:  size less than dates Extremities: extremities normal, atraumatic, no cyanosis or edema and Homans sign is negative, no sign of DVT Membranes: Ruptured  Fetal Monitoring:  Baseline: 130-135 bpm, Variability: Good {> 6 bpm), Accelerations: Reactive and Decelerations: Absent x3  Labs:  Results for orders placed or performed during the hospital encounter of 03/20/17 (from the past 24 hour(s))  Type and screen [place]   Collection Time: 04/29/17  5:53 AM  Result Value Ref Range   ABO/RH(D) B POS    Antibody Screen NEG    Sample Expiration 05/02/2017     Imaging Studies:      Medications:  Scheduled . aspirin EC  81 mg Oral Daily  . enoxaparin (LOVENOX) injection  40 mg Subcutaneous Q24H  . feeding supplement (ENSURE ENLIVE)  237 mL Oral BID BM  . folic acid  5  mg Oral Daily  . miconazole   Topical TID  . oxyCODONE  10 mg Oral Q4H  . prenatal multivitamin  1 tablet Oral Q1200  . senna-docusate  1 tablet Oral BID  . sodium chloride flush  3 mL Intravenous Q12H   I have reviewed the patient's current medications.  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 PPROM  S/p BMZ, latency antibiotics  No evidence of chorioamnionitis  Continue expectant management  NST reactive x3 2. Previous C/S x2  RLTCS and BTL 3. Sickle Cell anemia  Compensated for now. 4. Placenta previa  Appears resolved on [place] today. 5. Umbilical vein varix  Appears resolved by [place] today.  Continue routine antenatal care.   Truett Mainland, DO 04/29/2017,9:47 AM

## 2017-04-30 DIAGNOSIS — O09522 Supervision of elderly multigravida, second trimester: Secondary | ICD-10-CM

## 2017-04-30 LAB — TYPE AND SCREEN
ABO/RH(D): B POS
Antibody Screen: NEGATIVE
UNIT DIVISION: 0
UNIT DIVISION: 0
Unit tag comment: NEGATIVE
Unit tag comment: NEGATIVE

## 2017-04-30 LAB — BPAM RBC
BLOOD PRODUCT EXPIRATION DATE: 201901162359
Blood Product Expiration Date: 201901162359
UNIT TYPE AND RH: 9500
Unit Type and Rh: 9500

## 2017-04-30 NOTE — Progress Notes (Signed)
FACULTY PRACTICE ANTEPARTUM NOTE  [name] is a 46 y.o. [id number] at [redacted]w[redacted]d  who is admitted for PPROM.   Fetal presentation is breech. Length of Stay:  41  Days  Subjective: Patient without new complaint.  Patient reports good fetal movement.   She reports no uterine contractions She reports no bleeding  She reports mild loss of fluid per vagina.  Vitals:  Blood pressure 125/70, pulse 74, temperature 98.1 F (36.7 C), temperature source Oral, resp. rate 16, height 5\' 7"  (1.702 m), weight 128 lb (58.1 kg), last menstrual period 10/10/2016, SpO2 93 %. Physical Examination:  General appearance - alert, well appearing, and in no distress Chest - clear to auscultation, no wheezes, rales or rhonchi, symmetric air entry Heart - normal rate, regular rhythm, normal S1, S2, no murmurs, rubs, clicks or gallops Abdomen - soft, nontender, nondistended, no masses or organomegaly Fundal Height:  size less than dates Extremities: extremities normal, atraumatic, no cyanosis or edema, Homans sign is negative, no sign of DVT, no edema, redness or tenderness in the calves or thighs and no ulcers, gangrene or trophic changes  Membranes:ruptured  Fetal Monitoring:  Baseline: 130s-140s bpm, Variability: Good {> 6 bpm), Accelerations: Non-reactive but appropriate for gestational age and Decelerations: Variable: occasional x3  Labs:  No results found for this or any previous visit (from the past 24 hour(s)).  Imaging Studies:       Medications:  Scheduled . aspirin EC  81 mg Oral Daily  . enoxaparin (LOVENOX) injection  40 mg Subcutaneous Q24H  . feeding supplement (ENSURE ENLIVE)  237 mL Oral BID BM  . folic acid  5 mg Oral Daily  . miconazole   Topical TID  . oxyCODONE  10 mg Oral Q4H  . prenatal multivitamin  1 tablet Oral Q1200  . senna-docusate  1 tablet Oral BID  . sodium chloride flush  3 mL Intravenous Q12H   I have reviewed the patient's current medications.  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 PPROM  S/p BMZ, latency antibiotics  No evidence of chorioamnionitis  Continue expectant management  NST reactive x3 2. Previous C/S x2  RLTCS and BTL 3. Sickle Cell anemia  Compensated for now. 4. Placenta previa  Appears resolved on [place]. 5. Umbilical vein varix  Appears resolved by [place].   Continue routine antenatal care.   Truett Mainland, DO 04/30/2017,10:15 AM

## 2017-05-01 NOTE — Progress Notes (Signed)
Patient ID: Yetta Marceaux, female   DOB: 03-13-1971, 46 y.o.   MRN: [id number] [place] ANTEPARTUM NOTE  Kristianna Saperstein is a 46 y.o. [id number] at [redacted]w[redacted]d  who is admitted for PROM.   Fetal presentation is breech. Length of Stay:  42  Days  Subjective: Patient without concerns today. No fevers, chills, nausea, vomiting. Patient reports good fetal movement.   She reports no uterine contractions She reports no bleeding  She reports no loss of fluid per vagina.  Vitals:  Blood pressure 123/63, pulse 81, temperature 98.6 F (37 C), temperature source Oral, resp. rate 18, height 5\' 7"  (1.702 m), weight 128 lb (58.1 kg), last menstrual period 10/10/2016, SpO2 90 %. Physical Examination:  General appearance - alert, well appearing, and in no distress Chest - clear to auscultation, no wheezes, rales or rhonchi, symmetric air entry Heart - normal rate, regular rhythm, normal S1, S2, no murmurs, rubs, clicks or gallops Abdomen - soft, nontender, nondistended, no masses or organomegaly Fundal Height:  size less than dates Extremities: extremities normal, atraumatic, no cyanosis or edema and Homans sign is negative, no sign of DVT  Membranes:ruptured  Fetal Monitoring:  Baseline: 130-140 bpm, Variability: Good {> 6 bpm), Accelerations: Reactive and Decelerations: Absent  Labs:  No results found for this or any previous visit (from the past 24 hour(s)).  Imaging Studies:       Medications:  Scheduled . aspirin EC  81 mg Oral Daily  . enoxaparin (LOVENOX) injection  40 mg Subcutaneous Q24H  . feeding supplement (ENSURE ENLIVE)  237 mL Oral BID BM  . folic acid  5 mg Oral Daily  . miconazole   Topical TID  . oxyCODONE  10 mg Oral Q4H  . prenatal multivitamin  1 tablet Oral Q1200  . senna-docusate  1 tablet Oral BID  . sodium chloride flush  3 mL Intravenous Q12H   I have reviewed the patient's current medications.  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 PPROM  S/p BMZ and latency antibiotics  No evidence of infection  Last growth [place]: 12/3  NST reactive x3 2. Previous cesarean section  Plan for repeat cesarean 3. AMA Low risk NIPs 4. Sickle cells  Stable 5. UVV  Last US shows resolution 6. Placenta Previa  Last US showed resolution of partial previa Continue routine antenatal care.   Truett Mainland, DO 05/01/2017,9:34 AM

## 2017-05-02 DIAGNOSIS — O09212 Supervision of pregnancy with history of pre-term labor, second trimester: Secondary | ICD-10-CM

## 2017-05-02 NOTE — Progress Notes (Signed)
Patient ID: [name], female   DOB: Mar 27, 1971, 46 y.o.   MRN: [id number] [place] ANTEPARTUM NOTE  [name] is a 46 y.o. [id number] at [redacted]w[redacted]d  who is admitted for PROM.   Fetal presentation is breech. Length of Stay:  43  Days  Subjective: Patient continues without concerns. No fevers, chills, nausea, vomiting. Patient reports good fetal movement.   She reports no uterine contractions She reports no bleeding  She reports no loss of fluid per vagina.  Vitals:  Blood pressure 121/73, pulse 70, temperature 98 F (36.7 C), temperature source Oral, resp. rate 18, height 5\' 7"  (1.702 m), weight 128 lb 4 oz (58.2 kg), last menstrual period 10/10/2016, SpO2 94 %. Physical Examination:  General appearance - alert, well appearing, and in no distress Chest - clear to auscultation, no wheezes, rales or rhonchi, symmetric air entry Heart - normal rate, regular rhythm, normal S1, S2, no murmurs, rubs, clicks or gallops Abdomen - soft, nontender, nondistended, no masses or organomegaly Fundal Height:  size less than dates Extremities: extremities normal, atraumatic, no cyanosis or edema and Homans sign is negative, no sign of DVT  Membranes:ruptured  Fetal Monitoring:  Baseline: 130-140 bpm, Variability: Good {> 6 bpm), Accelerations: Reactive and Decelerations: Absent  Labs:  Results for orders placed or performed during the hospital encounter of 03/20/17 (from the past 24 hour(s))  Type and screen [place]   Collection Time: 05/02/17  5:26 AM  Result Value Ref Range   ABO/RH(D) B POS    Antibody Screen NEG    Sample Expiration 05/05/2017     Imaging Studies:       Medications:  Scheduled . aspirin EC  81 mg Oral Daily  . enoxaparin (LOVENOX) injection  40 mg Subcutaneous Q24H  . feeding supplement (ENSURE ENLIVE)  237 mL Oral BID BM  . folic acid  5 mg Oral Daily  . miconazole   Topical TID  . oxyCODONE  10 mg Oral Q4H  . prenatal multivitamin  1  tablet Oral Q1200  . senna-docusate  1 tablet Oral BID  . sodium chloride flush  3 mL Intravenous Q12H   I have reviewed the patient's current medications.  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 PPROM  S/p BMZ and latency antibiotics  No evidence of infection  Last growth [place]: 12/3  NST reactive x3 2. Previous cesarean section  Plan for repeat cesarean 3. AMA Low risk NIPs 4. Sickle cells  Stable 5. UVV  Last US shows resolution 6. Placenta Previa  Last US showed resolution of partial previa Continue routine antenatal care.   Truett Mainland, DO 05/02/2017,11:28 AM

## 2017-05-03 MED ORDER — BETAMETHASONE SOD PHOS & ACET 6 (3-3) MG/ML IJ SUSP
12.0000 mg | INTRAMUSCULAR | Status: AC
  Administered 2017-05-03 – 2017-05-04 (×2): 12 mg via INTRAMUSCULAR
  Filled 2017-05-03 (×2): qty 2

## 2017-05-03 NOTE — Progress Notes (Signed)
Daily Antepartum Note  Admission Date: 03/20/2017 Current Date: 05/03/2017 7:39 AM  [name] is a 46 y.o. [id number] @ [redacted]w[redacted]d, HD#45, admitted for PPROM.  Pregnancy complicated by: right lateral previa, AMA, [place] disease, h/o c-section x 2, borderline umbilical vein varix .  Overnight/24hr events:  None  Subjective:  No decreased FM, abdominal pain or cramping. Scant LOF, pink like before prior to admission  Objective:    Current Vital Signs 24h Vital Sign Ranges  T 98.2 F (36.8 C) Temp  Avg: 98.1 F (36.7 C)  Min: 98 F (36.7 C)  Max: 98.3 F (36.8 C)  BP 137/69 BP  Min: 116/69  Max: 137/69  HR 79 Pulse  Avg: 76.3  Min: 70  Max: 81  RR 18 Resp  Avg: 17.7  Min: 17  Max: 18  SaO2 92 % Not Delivered SpO2  Avg: 93.6 %  Min: 91 %  Max: 98 %       24 Hour I/O Current Shift I/O  Time Ins Outs No intake/output data recorded. No intake/output data recorded.   Afternoon NST reactive but evening one borderline [place]: quiet  Physical exam: General: Well nourished, well developed female in no acute distress. Abdomen: gravid, nttp Cardiovascular: S1, S2 normal, no murmur, rub or gallop, regular rate and rhythm Respiratory: CTAB, no resp distress Extremities: no clubbing, cyanosis or edema Skin: Warm and dry.   Medications: Current Facility-Administered Medications  Medication Dose Route Frequency Provider Last Rate Last Dose  . acetaminophen (TYLENOL) tablet 650 mg  650 mg Oral Q4H PRN Aletha Halim, MD   650 mg at 04/03/17 1241  . aspirin EC tablet 81 mg  81 mg Oral Daily Aletha Halim, MD   81 mg at 05/02/17 0945  . calcium carbonate (TUMS - dosed in mg elemental calcium) chewable tablet 400 mg of elemental calcium  2 tablet Oral Q4H PRN Aletha Halim, MD   400 mg of elemental calcium at 04/02/17 0424  . diphenhydrAMINE (BENADRYL) capsule 25 mg  25 mg Oral Q6H PRN Diallo, Abdoulaye, MD   25 mg at 04/22/17 0134  . enoxaparin (LOVENOX) injection 40 mg  40 mg  Subcutaneous Q24H Aletha Halim, MD   40 mg at 05/02/17 2213  . feeding supplement (ENSURE ENLIVE) (ENSURE ENLIVE) liquid 237 mL  237 mL Oral BID BM Anyanwu, Ugonna A, MD   237 mL at 05/02/17 1321  . folic acid (FOLVITE) tablet 5 mg  5 mg Oral Daily Truett Mainland, DO   5 mg at 05/02/17 0945  . HYDROmorphone (DILAUDID) injection 1 mg  1 mg Intravenous Q3H PRN Leana Gamer, MD   1 mg at 05/03/17 0452  . miconazole (MICOTIN) 2 % cream   Topical TID Anyanwu, Ugonna A, MD      . oxyCODONE (Oxy IR/ROXICODONE) immediate release tablet 10 mg  10 mg Oral Q4H Woodroe Mode, MD   10 mg at 05/03/17 3419  . polyethylene glycol (MIRALAX / GLYCOLAX) packet 17 g  17 g Oral Daily PRN Truett Mainland, DO   17 g at 04/29/17 1443  . prenatal multivitamin tablet 1 tablet  1 tablet Oral Q1200 Aletha Halim, MD   1 tablet at 05/02/17 0945  . RESTA SILVER GEL 1 application  1 application Apply externally Daily PRN Anyanwu, Sallyanne Havers, MD   1 application at 37/90/24 2123  . senna-docusate (Senokot-S) tablet 1 tablet  1 tablet Oral BID Donnamae Jude, MD   1 tablet at 05/02/17  2213  . sodium chloride flush (NS) 0.9 % injection 3 mL  3 mL Intravenous Q12H Truett Mainland, DO   3 mL at 05/02/17 2213  . zolpidem (AMBIEN) tablet 5 mg  5 mg Oral QHS PRN Aletha Halim, MD   5 mg at 04/15/17 2210    Labs:  No new labs  Radiology:  12/10: oligo, breech. cx 4.1cm. Prominent UV but no varix 11/15: breech, right lateral previa. AFI 2.5, 930gm, 77%, AC 95%, [place], marginal abruption unchanged. UVV only 31mm  Assessment & Plan:  Pt stable *Pregnancy: NST early today and do BPP if NR. follow up final u/s read. routine care. BTL papers signed. D/w pt prior to actually doing it as she may change her mind based on GA. 1hr GTT neg *Preterm: rescue bmz ordered for today and tomorrow -Mg for fetal NP if able to if for delivery before 32wks. Consider rescue bmz on 11/21 and 22 -s/p NICU consult -s/p BMZ on 11/2 and  on 11/6 and 11/7 -s/p latency abx *h/o c-section: will need repeat *Previa: stable. F/u u/s today *Anemia: stable and near baseline. *UVV: continue with 2x/wk scans with repeat today *PPx: OOB ad lib, lovenox qday *FEN/GI: regular diet. Saline lock IV *Dispo: here until delivery  Durene Romans. MD Attending Center for [place] [facility])

## 2017-05-04 ENCOUNTER — Encounter (HOSPITAL_COMMUNITY): Payer: Self-pay | Admitting: Anesthesiology

## 2017-05-04 ENCOUNTER — Inpatient Hospital Stay (HOSPITAL_COMMUNITY): Payer: Medicaid Other | Admitting: Anesthesiology

## 2017-05-04 ENCOUNTER — Encounter (HOSPITAL_COMMUNITY)
Admission: AD | Disposition: A | Discharge status: Home / Self Care | Payer: Self-pay | Source: Ambulatory Visit | Attending: Obstetrics and Gynecology

## 2017-05-04 DIAGNOSIS — Z3A29 29 weeks gestation of pregnancy: Secondary | ICD-10-CM

## 2017-05-04 DIAGNOSIS — O09529 Supervision of elderly multigravida, unspecified trimester: Secondary | ICD-10-CM

## 2017-05-04 DIAGNOSIS — O99019 Anemia complicating pregnancy, unspecified trimester: Secondary | ICD-10-CM

## 2017-05-04 DIAGNOSIS — D571 Sickle-cell disease 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: Secondary | ICD-10-CM

## 2017-05-04 DIAGNOSIS — Z302 Encounter for sterilization: Secondary | ICD-10-CM

## 2017-05-04 DIAGNOSIS — O34211 Maternal care for low transverse scar from previous cesarean delivery: Secondary | ICD-10-CM | POA: Diagnosis not present

## 2017-05-04 LAB — CBC
HCT: 21.6 % — ABNORMAL LOW (ref 36.0–46.0)
HEMOGLOBIN: 7.5 g/dL — AB (ref 12.0–15.0)
MCH: 30.4 pg (ref 26.0–34.0)
MCHC: 34.7 g/dL (ref 30.0–36.0)
MCV: 87.4 fL (ref 78.0–100.0)
PLATELETS: 289 10*3/uL (ref 150–400)
RBC: 2.47 MIL/uL — AB (ref 3.87–5.11)
RDW: 27.1 % — ABNORMAL HIGH (ref 11.5–15.5)
WBC: 19 10*3/uL — AB (ref 4.0–10.5)

## 2017-05-04 LAB — PREPARE RBC (CROSSMATCH)

## 2017-05-04 SURGERY — Surgical Case
Anesthesia: Spinal

## 2017-05-04 MED ORDER — CEFAZOLIN SODIUM-DEXTROSE 2-4 GM/100ML-% IV SOLN: 2.0000 g | INTRAVENOUS | Status: DC

## 2017-05-04 MED ORDER — LACTATED RINGERS IV SOLN: INTRAVENOUS | Status: DC

## 2017-05-04 MED ORDER — SIMETHICONE 80 MG PO CHEW
80.0000 mg | CHEWABLE_TABLET | ORAL | Status: DC
  Administered 2017-05-04 – 2017-05-06 (×3): 80 mg via ORAL
  Filled 2017-05-04 (×3): qty 1

## 2017-05-04 MED ORDER — DIPHENHYDRAMINE HCL 50 MG/ML IJ SOLN: 12.5000 mg | INTRAMUSCULAR | Status: DC | PRN

## 2017-05-04 MED ORDER — NALBUPHINE HCL 10 MG/ML IJ SOLN: 5.0000 mg | Freq: Once | INTRAMUSCULAR | Status: DC | PRN

## 2017-05-04 MED ORDER — KETOROLAC TROMETHAMINE 30 MG/ML IJ SOLN: 30.0000 mg | Freq: Four times a day (QID) | INTRAMUSCULAR | Status: AC | PRN

## 2017-05-04 MED ORDER — ONDANSETRON HCL 4 MG/2ML IJ SOLN: 4.0000 mg | Freq: Three times a day (TID) | INTRAMUSCULAR | Status: DC | PRN

## 2017-05-04 MED ORDER — ONDANSETRON HCL 4 MG/2ML IJ SOLN
INTRAMUSCULAR | Status: DC | PRN
  Administered 2017-05-04: 4 mg via INTRAVENOUS

## 2017-05-04 MED ORDER — IBUPROFEN 600 MG PO TABS
600.0000 mg | ORAL_TABLET | Freq: Four times a day (QID) | ORAL | Status: DC
  Administered 2017-05-04 – 2017-05-07 (×11): 600 mg via ORAL
  Filled 2017-05-04 (×11): qty 1

## 2017-05-04 MED ORDER — DIPHENHYDRAMINE HCL 25 MG PO CAPS: 25.0000 mg | ORAL_CAPSULE | Freq: Four times a day (QID) | ORAL | Status: DC | PRN

## 2017-05-04 MED ORDER — NALBUPHINE HCL 10 MG/ML IJ SOLN: 5.0000 mg | INTRAMUSCULAR | Status: DC | PRN

## 2017-05-04 MED ORDER — SOD CITRATE-CITRIC ACID 500-334 MG/5ML PO SOLN
ORAL | Status: AC
  Filled 2017-05-04: qty 15

## 2017-05-04 MED ORDER — PROMETHAZINE HCL 25 MG/ML IJ SOLN: 6.2500 mg | INTRAMUSCULAR | Status: DC | PRN

## 2017-05-04 MED ORDER — CEFAZOLIN SODIUM-DEXTROSE 2-3 GM-%(50ML) IV SOLR
INTRAVENOUS | Status: AC
  Filled 2017-05-04: qty 50

## 2017-05-04 MED ORDER — LACTATED RINGERS IV BOLUS (SEPSIS)
1000.0000 mL | Freq: Once | INTRAVENOUS | Status: AC
  Administered 2017-05-04: 1000 mL via INTRAVENOUS

## 2017-05-04 MED ORDER — MEPERIDINE HCL 25 MG/ML IJ SOLN: 6.2500 mg | INTRAMUSCULAR | Status: DC | PRN

## 2017-05-04 MED ORDER — OXYTOCIN 10 UNIT/ML IJ SOLN
INTRAMUSCULAR | Status: DC | PRN
  Administered 2017-05-04: 40 [IU] via INTRAVENOUS

## 2017-05-04 MED ORDER — CEFAZOLIN SODIUM-DEXTROSE 2-3 GM-%(50ML) IV SOLR
INTRAVENOUS | Status: DC | PRN
  Administered 2017-05-04: 2 g via INTRAVENOUS

## 2017-05-04 MED ORDER — MIDAZOLAM HCL 2 MG/2ML IJ SOLN
INTRAMUSCULAR | Status: DC | PRN
  Administered 2017-05-04: 2 mg via INTRAVENOUS

## 2017-05-04 MED ORDER — COCONUT OIL OIL: 1.0000 "application " | TOPICAL_OIL | Status: DC | PRN

## 2017-05-04 MED ORDER — ACETAMINOPHEN 325 MG PO TABS
650.0000 mg | ORAL_TABLET | ORAL | Status: DC | PRN
  Administered 2017-05-06: 650 mg via ORAL
  Filled 2017-05-04: qty 2

## 2017-05-04 MED ORDER — SENNOSIDES-DOCUSATE SODIUM 8.6-50 MG PO TABS
2.0000 | ORAL_TABLET | ORAL | Status: DC
  Administered 2017-05-04 – 2017-05-06 (×3): 2 via ORAL
  Filled 2017-05-04 (×3): qty 2

## 2017-05-04 MED ORDER — SCOPOLAMINE 1 MG/3DAYS TD PT72
1.0000 | MEDICATED_PATCH | Freq: Once | TRANSDERMAL | Status: DC
  Filled 2017-05-04: qty 1

## 2017-05-04 MED ORDER — NALOXONE HCL 0.4 MG/ML IJ SOLN
1.0000 ug/kg/h | INTRAVENOUS | Status: DC | PRN
  Filled 2017-05-04: qty 5

## 2017-05-04 MED ORDER — MIDAZOLAM HCL 2 MG/2ML IJ SOLN
INTRAMUSCULAR | Status: AC
  Filled 2017-05-04: qty 2

## 2017-05-04 MED ORDER — PRENATAL MULTIVITAMIN CH
1.0000 | ORAL_TABLET | Freq: Every day | ORAL | Status: DC
  Administered 2017-05-05 – 2017-05-07 (×3): 1 via ORAL
  Filled 2017-05-04 (×3): qty 1

## 2017-05-04 MED ORDER — FENTANYL CITRATE (PF) 100 MCG/2ML IJ SOLN
INTRAMUSCULAR | Status: AC
  Filled 2017-05-04: qty 2

## 2017-05-04 MED ORDER — DIBUCAINE 1 % RE OINT: 1.0000 "application " | TOPICAL_OINTMENT | RECTAL | Status: DC | PRN

## 2017-05-04 MED ORDER — LACTATED RINGERS IV SOLN
INTRAVENOUS | Status: DC | PRN
  Administered 2017-05-04 (×2): via INTRAVENOUS

## 2017-05-04 MED ORDER — KETAMINE HCL 10 MG/ML IJ SOLN
INTRAMUSCULAR | Status: AC
  Filled 2017-05-04: qty 1

## 2017-05-04 MED ORDER — BUPIVACAINE HCL (PF) 0.5 % IJ SOLN
INTRAMUSCULAR | Status: AC
  Filled 2017-05-04: qty 30

## 2017-05-04 MED ORDER — MORPHINE SULFATE (PF) 0.5 MG/ML IJ SOLN
INTRAMUSCULAR | Status: AC
  Filled 2017-05-04: qty 10

## 2017-05-04 MED ORDER — FENTANYL CITRATE (PF) 100 MCG/2ML IJ SOLN
INTRAMUSCULAR | Status: DC | PRN
  Administered 2017-05-04: 90 ug via INTRAVENOUS
  Administered 2017-05-04: 10 ug via INTRATHECAL

## 2017-05-04 MED ORDER — SIMETHICONE 80 MG PO CHEW: 80.0000 mg | CHEWABLE_TABLET | ORAL | Status: DC | PRN

## 2017-05-04 MED ORDER — SODIUM CHLORIDE 0.9 % IV SOLN
10.0000 mL/h | Freq: Once | INTRAVENOUS | Status: AC
  Administered 2017-05-04: 19:00:00 via INTRAVENOUS

## 2017-05-04 MED ORDER — FENTANYL CITRATE (PF) 100 MCG/2ML IJ SOLN: 25.0000 ug | INTRAMUSCULAR | Status: DC | PRN

## 2017-05-04 MED ORDER — OXYTOCIN 10 UNIT/ML IJ SOLN
INTRAMUSCULAR | Status: AC
  Filled 2017-05-04: qty 4

## 2017-05-04 MED ORDER — DIPHENHYDRAMINE HCL 25 MG PO CAPS: 25.0000 mg | ORAL_CAPSULE | ORAL | Status: DC | PRN

## 2017-05-04 MED ORDER — GLYCOPYRROLATE 0.2 MG/ML IJ SOLN
INTRAMUSCULAR | Status: DC | PRN
  Administered 2017-05-04: 0.1 mg via INTRAVENOUS

## 2017-05-04 MED ORDER — KETAMINE HCL 10 MG/ML IJ SOLN
INTRAMUSCULAR | Status: DC | PRN
  Administered 2017-05-04 (×8): 20 mg via INTRAVENOUS

## 2017-05-04 MED ORDER — MORPHINE SULFATE (PF) 0.5 MG/ML IJ SOLN
INTRAMUSCULAR | Status: DC | PRN
Start: 2017-05-04 — End: 2017-05-04
  Administered 2017-05-04: 4.8 mg via EPIDURAL
  Administered 2017-05-04: .2 mg via INTRATHECAL

## 2017-05-04 MED ORDER — ZOLPIDEM TARTRATE 5 MG PO TABS: 5.0000 mg | ORAL_TABLET | Freq: Every evening | ORAL | Status: DC | PRN

## 2017-05-04 MED ORDER — SCOPOLAMINE 1 MG/3DAYS TD PT72
MEDICATED_PATCH | TRANSDERMAL | Status: AC
  Filled 2017-05-04: qty 1

## 2017-05-04 MED ORDER — ENOXAPARIN SODIUM 40 MG/0.4ML ~~LOC~~ SOLN
40.0000 mg | SUBCUTANEOUS | Status: DC
  Filled 2017-05-04 (×3): qty 0.4

## 2017-05-04 MED ORDER — SCOPOLAMINE 1 MG/3DAYS TD PT72
MEDICATED_PATCH | TRANSDERMAL | Status: DC | PRN
  Administered 2017-05-04: 1 via TRANSDERMAL

## 2017-05-04 MED ORDER — BUPIVACAINE IN DEXTROSE 0.75-8.25 % IT SOLN
INTRATHECAL | Status: DC | PRN
  Administered 2017-05-04: 1.2 mL via INTRATHECAL

## 2017-05-04 MED ORDER — SODIUM CHLORIDE 0.9% FLUSH: 3.0000 mL | INTRAVENOUS | Status: DC | PRN

## 2017-05-04 MED ORDER — OXYTOCIN 40 UNITS IN LACTATED RINGERS INFUSION - SIMPLE MED: 2.5000 [IU]/h | INTRAVENOUS | Status: AC

## 2017-05-04 MED ORDER — SIMETHICONE 80 MG PO CHEW
80.0000 mg | CHEWABLE_TABLET | Freq: Three times a day (TID) | ORAL | Status: DC
  Administered 2017-05-05 – 2017-05-07 (×7): 80 mg via ORAL
  Filled 2017-05-04 (×7): qty 1

## 2017-05-04 MED ORDER — MENTHOL 3 MG MT LOZG
1.0000 | LOZENGE | OROMUCOSAL | Status: DC | PRN
Start: 2017-05-04 — End: 2017-05-07

## 2017-05-04 MED ORDER — TETANUS-DIPHTH-ACELL PERTUSSIS 5-2.5-18.5 LF-MCG/0.5 IM SUSP: 0.5000 mL | Freq: Once | INTRAMUSCULAR | Status: DC

## 2017-05-04 MED ORDER — LACTATED RINGERS IV SOLN
INTRAVENOUS | Status: DC
  Administered 2017-05-05: 03:00:00 via INTRAVENOUS

## 2017-05-04 MED ORDER — NALOXONE HCL 0.4 MG/ML IJ SOLN: 0.4000 mg | INTRAMUSCULAR | Status: DC | PRN

## 2017-05-04 MED ORDER — WITCH HAZEL-GLYCERIN EX PADS: 1.0000 "application " | MEDICATED_PAD | CUTANEOUS | Status: DC | PRN

## 2017-05-04 SURGICAL SUPPLY — 31 items
BARRIER ADHS 3X4 INTERCEED (GAUZE/BANDAGES/DRESSINGS) IMPLANT
CHLORAPREP W/TINT 26ML (MISCELLANEOUS) ×3 IMPLANT
CLAMP CORD UMBIL (MISCELLANEOUS) IMPLANT
CLIP FILSHIE TUBAL LIGA STRL (Clip) ×6 IMPLANT
CLOTH BEACON ORANGE TIMEOUT ST (SAFETY) ×3 IMPLANT
DRSG OPSITE POSTOP 4X10 (GAUZE/BANDAGES/DRESSINGS) ×3 IMPLANT
ELECT REM PT RETURN 9FT ADLT (ELECTROSURGICAL) ×3
ELECTRODE REM PT RTRN 9FT ADLT (ELECTROSURGICAL) ×1 IMPLANT
EXTRACTOR VACUUM KIWI (MISCELLANEOUS) IMPLANT
GLOVE BIO SURGEON STRL SZ 6.5 (GLOVE) ×2 IMPLANT
GLOVE BIO SURGEONS STRL SZ 6.5 (GLOVE) ×1
GLOVE BIOGEL PI IND STRL 7.0 (GLOVE) ×2 IMPLANT
GLOVE BIOGEL PI INDICATOR 7.0 (GLOVE) ×4
GOWN STRL REUS W/TWL LRG LVL3 (GOWN DISPOSABLE) ×6 IMPLANT
KIT ABG SYR 3ML LUER SLIP (SYRINGE) IMPLANT
NEEDLE HYPO 22GX1.5 SAFETY (NEEDLE) IMPLANT
NEEDLE HYPO 25X5/8 SAFETYGLIDE (NEEDLE) ×3 IMPLANT
NS IRRIG 1000ML POUR BTL (IV SOLUTION) ×3 IMPLANT
PACK C SECTION WH (CUSTOM PROCEDURE TRAY) ×3 IMPLANT
PAD OB MATERNITY 4.3X12.25 (PERSONAL CARE ITEMS) ×3 IMPLANT
PENCIL SMOKE EVAC W/HOLSTER (ELECTROSURGICAL) ×3 IMPLANT
RETRACTOR WND ALEXIS 25 LRG (MISCELLANEOUS) IMPLANT
RTRCTR WOUND ALEXIS 25CM LRG (MISCELLANEOUS)
SUT VIC AB 0 CT1 36 (SUTURE) ×18 IMPLANT
SUT VIC AB 2-0 CT1 27 (SUTURE) ×2
SUT VIC AB 2-0 CT1 TAPERPNT 27 (SUTURE) ×1 IMPLANT
SUT VIC AB 4-0 PS2 27 (SUTURE) ×3 IMPLANT
SYR CONTROL 10ML LL (SYRINGE) IMPLANT
SYR KIT LINE DRAW 1CC W/FILTR (LINER) ×3 IMPLANT
TOWEL OR 17X24 6PK STRL BLUE (TOWEL DISPOSABLE) ×3 IMPLANT
TRAY FOLEY BAG SILVER LF 14FR (SET/KITS/TRAYS/PACK) IMPLANT

## 2017-05-04 NOTE — Progress Notes (Signed)
Pt called out to nurses station screaming in pain. Pt reports sharp, stabbing pain in left, lower abdomen. Informed Dr. Roselie Awkward. Dr. Roselie Awkward is on his way up to the floor.

## 2017-05-04 NOTE — Anesthesia Preprocedure Evaluation (Addendum)
Anesthesia Evaluation  Patient identified by MRN, date of birth, ID band Patient awake    Reviewed: Allergy & Precautions, NPO status , Patient's Chart, lab work & pertinent test results  Airway Mallampati: I  TM Distance: >3 FB Neck ROM: Full    Dental  (+) Teeth Intact, Dental Advisory Given   Pulmonary neg pulmonary ROS,    breath sounds clear to auscultation       Cardiovascular negative cardio ROS   Rhythm:Regular Rate:Normal     Neuro/Psych negative neurological ROS  negative psych ROS   GI/Hepatic negative GI ROS, Neg liver ROS,   Endo/Other  negative endocrine ROS  Renal/GU negative Renal ROS  negative genitourinary   Musculoskeletal   Abdominal   Peds  Hematology  (+) Sickle cell anemia ,   Anesthesia Other Findings   Reproductive/Obstetrics (+) Pregnancy                            Lab Results  Component Value Date   WBC 19.0 (H) 05/04/2017   HGB 7.5 (L) 05/04/2017   HCT 21.6 (L) 05/04/2017   MCV 87.4 05/04/2017   PLT 289 05/04/2017   EKG: normal sinus rhythm.   Anesthesia Physical Anesthesia Plan  ASA: II  Anesthesia Plan: Spinal   Post-op Pain Management:    Induction:   PONV Risk Score and Plan: 3 and Ondansetron and Treatment may vary due to age or medical condition  Airway Management Planned: Natural Airway  Additional Equipment:   Intra-op Plan:   Post-operative Plan:   Informed Consent: I have reviewed the patients History and Physical, chart, labs and discussed the procedure including the risks, benefits and alternatives for the proposed anesthesia with the patient or authorized representative who has indicated his/her understanding and acceptance.     Plan Discussed with: CRNA  Anesthesia Plan Comments:         Anesthesia Quick Evaluation

## 2017-05-04 NOTE — Progress Notes (Signed)
CHG bath performed. Betadine swabs to both nares. SCD's on. Sodium citrate given. Transferred to OR 9 on call.

## 2017-05-04 NOTE — Op Note (Signed)
[name] PROCEDURE DATE: 05/04/2017  PREOPERATIVE DIAGNOSES: Intrauterine pregnancy at [redacted]w[redacted]d weeks gestation; chorioamnionitis and malpresentation: breech,desires tubal sterilization  POSTOPERATIVE DIAGNOSES: The same  PROCEDURE:Repeat Low Transverse Cesarean Section, bilateral tubal sterilization  SURGEON:  Woodroe Mode, MD   ASSISTANT:  none  ANESTHESIOLOGY TEAM: Anesthesiologist: Effie Berkshire, MD CRNA: Adalberto Ill, CRNA; Garner Nash, CRNA  INDICATIONS: [name] is a 46 y.o. [phone] at [redacted]w[redacted]d here for cesarean section secondary to the indications listed under preoperative diagnoses; please see preoperative note for further details.  The risks of cesarean section were discussed with the patient including but were not limited to: bleeding which may require transfusion or reoperation; infection which may require antibiotics; injury to bowel, bladder, ureters or other surrounding organs; injury to the fetus; need for additional procedures including hysterectomy in the event of a life-threatening hemorrhage; placental abnormalities wth subsequent pregnancies, incisional problems, thromboembolic phenomenon and other postoperative/anesthesia complications.   The patient concurred with the proposed plan, giving informed written consent for the procedure.    FINDINGS:  Viable female infant in breech presentation.  Apgars 5 and 7.  Blood stained.  Intact placenta, three vessel cord.  Normal uterus, fallopian tubes and ovaries bilaterally.  ANESTHESIA: Spinal INTRAVENOUS FLUIDS: 1500 ml   ESTIMATED BLOOD LOSS: 500 ml URINE OUTPUT:  100 ml SPECIMENS: Placenta sent to pathology COMPLICATIONS: None immediate  PROCEDURE IN DETAIL:  The patient preoperatively received intravenous antibiotics and had sequential compression devices applied to her lower extremities.  She was then taken to the operating room where spinal anesthesia was administered and was found to be adequate. She  was then placed in a dorsal supine position with a leftward tilt, and prepped and draped in a sterile manner.  A foley catheter was placed into her bladder and attached to constant gravity.  After an adequate timeout was performed, a Pfannenstiel skin incision was made with scalpel over her preexisting scar and carried through to the underlying layer of fascia. The fascia was incised in the midline, and this incision was extended bilaterally using the Mayo scissors.  Kocher clamps were applied to the superior aspect of the fascial incision and the underlying rectus muscles were dissected off bluntly.  A similar process was carried out on the inferior aspect of the fascial incision. The rectus muscles were separated in the midline bluntly and the peritoneum was entered bluntly. Attention was turned to the lower uterine segment where a low transverse hysterotomy was made with a scalpel and extended bilaterally bluntly.  The infant was successfully delivered, the cord was clamped and cut after one minute, and the infant was handed over to the awaiting neonatology team. Uterine massage was then administered, and the placenta delivered intact with a three-vessel cord. The uterus was then cleared of clots and debris.  The hysterotomy was closed with 0 Vicryl in a running locked fashion, and an imbricating layer was also placed with 0 Vicryl.   The pelvis was cleared of all clot and debris. Hemostasis was confirmed on all surfaces.  The peritoneum was closed with a 2-0 Vicryl running stitch  The fascia was then closed using 0 Vicryl in a running fashion.  The subcutaneous layer was irrigated,  and 30 ml of 0.25% Marcaine was injected subcutaneously around the incision.  The skin was closed with a 4-0 Vicryl subcuticular stitch. The patient tolerated the procedure well. Sponge, lap, instrument and needle counts were correct x 3.  She was taken to the recovery room  in stable condition.    Woodroe Mode, MD Attending  [place], [facility]

## 2017-05-04 NOTE — Anesthesia Postprocedure Evaluation (Signed)
Anesthesia Post Note  Patient: [name]  Procedure(s) Performed: CESAREAN SECTION (N/A )     Patient location during evaluation: PACU Anesthesia Type: Spinal Level of consciousness: oriented and awake and alert Pain management: pain level controlled Vital Signs Assessment: post-procedure vital signs reviewed and stable Respiratory status: spontaneous breathing, respiratory function stable and patient connected to nasal cannula oxygen Cardiovascular status: blood pressure returned to baseline and stable Postop Assessment: no headache, no backache, no apparent nausea or vomiting, spinal receding and patient able to bend at knees Anesthetic complications: no    Last Vitals:  Vitals:   05/04/17 2100 05/04/17 2115  BP: 133/73 128/71  Pulse: (!) 53 (!) 59  Resp: 15 18  Temp:    SpO2: 100% 98%    Last Pain:  Vitals:   05/04/17 2045  TempSrc: Oral  PainSc: 0-No pain   Pain Goal: Patients Stated Pain Goal: 5 (05/04/17 1600)               Effie Berkshire

## 2017-05-04 NOTE — Progress Notes (Signed)
Pt called out screaming in pain. Pt reports pain in lower left abdomen. Scant amount of greenish-tinged vaginal fluid noted on pad. Notified Dr. Roselie Awkward. He is en route to department.

## 2017-05-04 NOTE — Progress Notes (Signed)
[place]) NOTE  [name] is a 46 y.o. [id number] at [redacted]w[redacted]d  who is admitted for PROM.   Fetal presentation is breech. Length of Stay:  45  Days  Subjective: Does not feel contractions, notes some low abdominal pain Patient reports the fetal movement as active. Patient reports uterine contraction  activity as none. Patient reports  vaginal bleeding as dark fluid has changed to blood tinged. Patient describes fluid per vagina as Other blood tinged.  Vitals:  Blood pressure 110/60, pulse 74, temperature 98.4 F (36.9 C), temperature source Oral, resp. rate 18, height 5\' 7"  (1.702 m), weight 57.8 kg (127 lb 8 oz), last menstrual period 10/10/2016, SpO2 93 %. Physical Examination:  General appearance - alert, well appearing, and in no distress Heart - normal rate and regular rhythm Abdomen - soft, nontender, nondistended Fundal Height:  size equals dates Cervical Exam: Not evaluated. . Extremities: extremities normal Membranes:ruptured Very dark old blood has changed to light pink stain Fetal Monitoring:  Fetal Heart Rate A  Mode External filed at 05/04/2017 0700  Baseline Rate (A) 125 bpm filed at 05/04/2017 0700  Variability <5 BPM, 6-25 BPM filed at 05/04/2017 0700  Accelerations 10 x 10, 15 x 15 filed at 05/04/2017 0700  Decelerations None filed at 05/04/2017 0700     Labs:  Results for orders placed or performed during the hospital encounter of 03/20/17 (from the past 24 hour(s))  CBC   Collection Time: 05/04/17  7:26 AM  Result Value Ref Range   WBC 19.0 (H) 4.0 - 10.5 K/uL   RBC 2.47 (L) 3.87 - 5.11 MIL/uL   Hemoglobin 7.5 (L) 12.0 - 15.0 g/dL   HCT 21.6 (L) 36.0 - 46.0 %   MCV 87.4 78.0 - 100.0 fL   MCH 30.4 26.0 - 34.0 pg   MCHC 34.7 30.0 - 36.0 g/dL   RDW 27.1 (H) 11.5 - 15.5 %   Platelets 289 150 - 400 K/uL    Imaging Studies:      Medications:  Scheduled . aspirin EC  81 mg Oral Daily  . feeding supplement (ENSURE  ENLIVE)  237 mL Oral BID BM  . folic acid  5 mg Oral Daily  . miconazole   Topical TID  . oxyCODONE  10 mg Oral Q4H  . prenatal multivitamin  1 tablet Oral Q1200  . senna-docusate  1 tablet Oral BID  . sodium chloride flush  3 mL Intravenous Q12H   I have reviewed the patient's current medications.  ASSESSMENT: Patient Active Problem List   Diagnosis Date Noted  . Abnormal quad screen   . Ringworm of body 04/05/2017  . Ulcer of right lower extremity ([place])   . Preterm premature rupture of membranes (PPROM)  in second trimester, antepartum 03/20/2017  . History of preterm delivery, currently pregnant in second trimester 03/20/2017  . Umbilical vein abnormality affecting pregnancy (varix) 03/20/2017  . 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 Partial placenta previa - right lateral previa 02/21/2017  . Abnormal MSAFP (maternal serum alpha-fetoprotein), elevated 02/18/2017  . Subchorionic hematoma in second trimester 12/23/2016  . Supervision of high risk pregnancy, antepartum 12/16/2016  . History of pre-eclampsia 12/16/2016  . 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 Advanced maternal age in multigravida 07/28/2013  . Previous cesarean delivery x 2 07/22/2013  . Hemochromatosis 11/10/2012  . Chronic pain syndrome 10/13/2012  . Leukocytosis 03/30/2012    PLAN: Continuous monitoring. Contractions have subsided and  fetal monitoring is category 1. She may eat.  [name] 05/04/2017,11:10 AM

## 2017-05-04 NOTE — Progress Notes (Signed)
[name] is a 46 y.o. [id number] at [redacted]w[redacted]d dmitted for PROM  Subjective:   Objective: BP 127/68 (BP Location: Left Arm)   Pulse 78   Temp 99.1 F (37.3 C) (Oral)   Resp 18   Ht 5\' 7"  (1.702 m)   Wt 57.8 kg (127 lb 8 oz)   LMP 10/10/2016 Comment: patient double shielded  SpO2 93%   BMI 19.97 kg/m  No intake/output data recorded. No intake/output data recorded. Uncomfortable, mild fundal tenderness FHT:  Fetal Heart Rate A  Mode External filed at 05/04/2017 1700  Baseline Rate (A) 140 bpm filed at 05/04/2017 1700  Variability <5 BPM, 6-25 BPM filed at 05/04/2017 1700  Accelerations 10 x 10 filed at 05/04/2017 1700  Decelerations Variable filed at 05/04/2017 1700    UC:   occasional SVE:    deferred  Labs: Lab Results  Component Value Date   WBC 19.0 (H) 05/04/2017   HGB 7.5 (L) 05/04/2017   HCT 21.6 (L) 05/04/2017   MCV 87.4 05/04/2017   PLT 289 05/04/2017    Assessment / Plan: suspect early signs of chorioamnionitis, breech   Preeclampsia:  no signs or symptoms of toxicity Fetal Wellbeing:  Category I Pain Control:    I/D:  n/a Anticipated MOD:  Previous cesarean section and breech, repeat cesarean section and BTL.The risks of cesarean section discussed with the patient included but were not limited to: bleeding which may require transfusion or reoperation; infection which may require antibiotics; injury to bowel, bladder, ureters or other surrounding organs; injury to the fetus; need for additional procedures including hysterectomy in the event of a life-threatening hemorrhage; placental abnormalities wth subsequent pregnancies, incisional problems, thromboembolic phenomenon and other postoperative/anesthesia complications. The patient concurred with the proposed plan, giving informed written consent for the procedure.   Patient has been NPO since 1300 she will remain NPO for procedure. Anesthesia and OR aware. Preoperative prophylactic antibiotics and SCDs ordered  on call to the OR.  To OR when ready.  2 units PRBC  .    [name] 05/04/2017, 6:03 PM

## 2017-05-04 NOTE — Transfer of Care (Signed)
Immediate Anesthesia Transfer of Care Note  Patient: [name]  Procedure(s) Performed: CESAREAN SECTION (N/A )  Patient Location: PACU  Anesthesia Type:Spinal  Level of Consciousness: awake, alert , oriented and patient cooperative  Airway & Oxygen Therapy: Patient Spontanous Breathing and Patient connected to nasal cannula oxygen  Post-op Assessment: Report given to RN and Post -op Vital signs reviewed and stable  Post vital signs: Reviewed and stable  Last Vitals:  Vitals:   05/04/17 1200 05/04/17 1600  BP: 119/62 127/68  Pulse: 80 78  Resp: 16 18  Temp: 36.7 C 37.3 C  SpO2: 94% 93%    Last Pain:  Vitals:   05/04/17 1700  TempSrc:   PainSc: Asleep      Patients Stated Pain Goal: 5 (83/33/83 2919)  Complications: No apparent anesthesia complications

## 2017-05-04 NOTE — Progress Notes (Signed)
Patient in the bathroom and called this nurse.  Patient passed large amount of greenish vaginal discharge, non -foul , thick in consistency.  Complained of back pain and right leg pain, medicated.  Attached to fetal monitoring. Noted to have uterine contraction every 2-3 min.  Dr. Elly Modena updated via phone.  LR 1 liter bolus given as ordered.  Will continue to monitor.

## 2017-05-04 NOTE — Progress Notes (Signed)
Patient ID: Veronica Fretz, female   DOB: 05/12/1971, 46 y.o.   MRN: [id number] [place] ANTEPARTUM NOTE  Armida Vickroy is a 46 y.o. [id number] at [redacted]w[redacted]d  who is admitted for PROM.   Fetal presentation is breech. Length of Stay:  45  Days  Subjective: Patient reports feeling well without complaints. She reports a change in her vaginal discharge from pink to dark brown. She is refusing lovenox but agrees to wearing SCD at all times Patient reports good fetal movement.   She reports no uterine contractions She reports no bleeding  She reports no loss of fluid per vagina.  Vitals:  Blood pressure 138/78, pulse 96, temperature 98 F (36.7 C), temperature source Oral, resp. rate 18, height 5\' 7"  (1.702 m), weight 127 lb 8 oz (57.8 kg), last menstrual period 10/10/2016, SpO2 98 %. Physical Examination:  General appearance - alert, well appearing, and in no distress Chest - clear to auscultation, no wheezes, rales or rhonchi, symmetric air entry Heart - normal rate, regular rhythm, normal S1, S2, no murmurs, rubs, clicks or gallops Abdomen - soft, gravid. Tenderness bilaterally on bilateral lateral aspects of the uterus Fundal Height:  size less than dates Extremities: extremities normal, atraumatic, no cyanosis or edema and Homans sign is negative, no sign of DVT  Membranes:ruptured  Fetal Monitoring:  Baseline: 130 bpm, Variability: Good {> 6 bpm), Accelerations: Reactive and Decelerations: Absent Contractions q 4-5 minutes Labs:  No results found for this or any previous visit (from the past 24 hour(s)).  Imaging Studies:       Medications:  Scheduled . aspirin EC  81 mg Oral Daily  . betamethasone acetate-betamethasone sodium phosphate  12 mg Intramuscular Q24 Hr x 2  . enoxaparin (LOVENOX) injection  40 mg Subcutaneous Q24H  . feeding supplement (ENSURE ENLIVE)  237 mL Oral BID BM  . folic acid  5 mg Oral Daily  . miconazole   Topical TID  . oxyCODONE  10 mg Oral Q4H  .  prenatal multivitamin  1 tablet Oral Q1200  . senna-docusate  1 tablet Oral BID  . sodium chloride flush  3 mL Intravenous Q12H   I have reviewed the patient's current medications.  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 PPROM  S/p latency antibiotics  S/p second course of BMZ  Patient with regular contractions this morning, initially q 2 minutes but improved to q5 minutes following IV bolus. Patient is unaware of her contractions  Patient reports abdominal tenderness to touch has been present for the past several weeks and has been unchanged.  Last growth [place] on 12/3 with normal interval growth EFW 1070 gm (48%tile), breech presentation and persistent oligo  Monitor closely for si/sx of chorio  2. Previous cesarean section  Plan for repeat cesarean with bilateral tubal ligation   3. AMA Low risk NIPs  4. Sickle cells  Stable  Continue routine antenatal care.   Jaggar Benko, MD 05/04/2017,6:58 AM

## 2017-05-04 NOTE — Progress Notes (Signed)
Large amount of dark green, non-foul vaginal discharge noted on pad. Pt denies pain at this time and is unaware of ctx. EFM showing ctx every 4-5 minutes. Dr. Elly Modena updated. No new orders at this time. Will continue to monitor.

## 2017-05-04 NOTE — Anesthesia Procedure Notes (Signed)
Spinal  Patient location during procedure: OR Start time: 05/04/2017 6:48 PM End time: 05/04/2017 6:51 PM Staffing Anesthesiologist: Effie Berkshire, MD Performed: anesthesiologist  Preanesthetic Checklist Completed: patient identified, site marked, surgical consent, pre-op evaluation, timeout performed, IV checked, risks and benefits discussed and monitors and equipment checked Spinal Block Patient position: sitting Prep: Betadine Patient monitoring: heart rate, continuous pulse ox, blood pressure and cardiac monitor Approach: midline Location: L4-5 Injection technique: single-shot Needle Needle type: Introducer and Pencan  Needle gauge: 24 G Needle length: 9 cm Additional Notes Pt complained of pain as needle was being removed from skin. No paresthesia. Negative blood return. Positive free-flowing CSF. Expiration date of kit checked and confirmed. Patient tolerated procedure well, without complications.

## 2017-05-04 NOTE — Progress Notes (Signed)
Pt c/o pinching, stabbing pain in lower abdomen that comes and goes. Rating pain 8/10. Pt also stating she is having constant back pain. No ctx noted on monitor. Adjusted toco. Pt was unable to tell RN how often she was feeling the abdominal pain. Dr. Roselie Awkward notified. Dr. Roselie Awkward stated that he would come and assess pt.

## 2017-05-05 ENCOUNTER — Encounter (HOSPITAL_COMMUNITY): Payer: Self-pay | Admitting: Obstetrics & Gynecology

## 2017-05-05 LAB — TYPE AND SCREEN
ABO/RH(D): B POS
ANTIBODY SCREEN: NEGATIVE
UNIT DIVISION: 0
UNIT DIVISION: 0
UNIT TAG COMMENT: NEGATIVE
Unit division: 0
Unit division: 0
Unit division: 0
Unit tag comment: NEGATIVE
Unit tag comment: NEGATIVE

## 2017-05-05 LAB — CBC
HCT: 24.1 % — ABNORMAL LOW (ref 36.0–46.0)
Hemoglobin: 8.2 g/dL — ABNORMAL LOW (ref 12.0–15.0)
MCH: 28.6 pg (ref 26.0–34.0)
MCHC: 34 g/dL (ref 30.0–36.0)
MCV: 84 fL (ref 78.0–100.0)
Platelets: 270 K/uL (ref 150–400)
RBC: 2.87 MIL/uL — ABNORMAL LOW (ref 3.87–5.11)
RDW: 23.9 % — ABNORMAL HIGH (ref 11.5–15.5)
WBC: 23.4 K/uL — ABNORMAL HIGH (ref 4.0–10.5)

## 2017-05-05 LAB — BPAM RBC
BLOOD PRODUCT EXPIRATION DATE: 201901082359
BLOOD PRODUCT EXPIRATION DATE: 201901162359
Blood Product Expiration Date: 201812272359
Blood Product Expiration Date: 201901082359
Blood Product Expiration Date: 201901162359
ISSUE DATE / TIME: 201812161822
ISSUE DATE / TIME: 201812161822
UNIT TYPE AND RH: 9500
UNIT TYPE AND RH: 9500
Unit Type and Rh: 1700
Unit Type and Rh: 7300
Unit Type and Rh: 9500

## 2017-05-05 NOTE — Progress Notes (Signed)
Subjective: Postpartum Day 1: Cesarean Delivery Patient without complaints today. Pain controlled. Tolerating diet. Voiding without problems.   Objective: Vital signs in last 24 hours: Temp:  [97.7 F (36.5 C)-99.1 F (37.3 C)] 98.4 F (36.9 C) (12/17 1200) Pulse Rate:  [49-84] 79 (12/17 1200) Resp:  [0-22] 18 (12/17 1200) BP: (115-140)/(63-85) 131/85 (12/17 1200) SpO2:  [92 %-100 %] 93 % (12/17 1200) Weight:  [59.9 kg (132 lb)] 59.9 kg (132 lb) (12/17 0600)  Physical Exam:  General: alert Lochia: appropriate Uterine Fundus: firm Incision: drsg intact DVT Evaluation: No evidence of DVT seen on physical exam.  Recent Labs    05/04/17 0726 05/05/17 0650  HGB 7.5* 8.2*  HCT 21.6* 24.1*    Assessment/Plan: Status post Cesarean section. Doing well postoperatively.  Continue current care.  [name] 05/05/2017, 2:12 PM

## 2017-05-05 NOTE — Addendum Note (Signed)
Addendum  created 05/05/17 0739 by Raenette Rover, CRNA   Sign clinical note

## 2017-05-05 NOTE — Anesthesia Postprocedure Evaluation (Signed)
Anesthesia Post Note  Patient: [name]  Procedure(s) Performed: CESAREAN SECTION (N/A )     Patient location during evaluation: [facility] Anesthesia Type: Spinal Level of consciousness: awake, awake and alert, oriented and patient cooperative Pain management: pain level controlled Vital Signs Assessment: post-procedure vital signs reviewed and stable Respiratory status: spontaneous breathing, nonlabored ventilation and respiratory function stable Cardiovascular status: stable Postop Assessment: no headache, no backache, no apparent nausea or vomiting and patient able to bend at knees Anesthetic complications: no    Last Vitals:  Vitals:   05/05/17 0500 05/05/17 0600  BP:  115/71  Pulse:  (!) 52  Resp: 18 18  Temp:  36.7 C  SpO2: 99% 94%    Last Pain:  Vitals:   05/05/17 0600  TempSrc: Oral  PainSc: 2    Pain Goal: Patients Stated Pain Goal: 2 (05/05/17 0500)               Nishi Neiswonger L

## 2017-05-05 NOTE — Lactation Note (Signed)
This note was copied from a baby's chart. Lactation Consultation Note  Patient Name: [name] [id number] Date: 05/05/2017 Reason for consult: Initial assessment;NICU baby;Other (Comment)(lactation induction/ mom desired to eat her lunch and have LC return later )  DEBP set up and the HROB RN mentioned she was encouraging mom to pump and having a difficult time getting her to so.'. 2nd visit mom in NICU.    Maternal Data    Feeding    LATCH Score                   Interventions Interventions: Breast feeding basics reviewed  Lactation Tools Discussed/Used Tools: Pump Breast pump type: Double-Electric Breast Pump Pump Review: Setup, frequency, and cleaning   Consult Status Consult Status: Follow-up Date: 05/06/17 Follow-up type: In-patient    [place] 05/05/2017, 4:31 PM

## 2017-05-06 LAB — CBC WITH DIFFERENTIAL/PLATELET
BASOS ABS: 0 10*3/uL (ref 0.0–0.1)
Band Neutrophils: 0 %
Basophils Relative: 0 %
Blasts: 0 %
EOS ABS: 0 10*3/uL (ref 0.0–0.7)
Eosinophils Relative: 0 %
HCT: 22.1 % — ABNORMAL LOW (ref 36.0–46.0)
HEMOGLOBIN: 7.6 g/dL — AB (ref 12.0–15.0)
LYMPHS ABS: 2.1 10*3/uL (ref 0.7–4.0)
Lymphocytes Relative: 9 %
MCH: 29.1 pg (ref 26.0–34.0)
MCHC: 34.4 g/dL (ref 30.0–36.0)
MCV: 84.7 fL (ref 78.0–100.0)
METAMYELOCYTES PCT: 0 %
MYELOCYTES: 0 %
Monocytes Absolute: 1.2 10*3/uL — ABNORMAL HIGH (ref 0.1–1.0)
Monocytes Relative: 5 %
NEUTROS ABS: 19.7 10*3/uL — AB (ref 1.7–7.7)
NEUTROS PCT: 86 %
NRBC: 89 /100{WBCs} — AB
Other: 0 %
PLATELETS: 238 10*3/uL (ref 150–400)
PROMYELOCYTES ABS: 0 %
RBC: 2.61 MIL/uL — AB (ref 3.87–5.11)
RDW: 24.3 % — ABNORMAL HIGH (ref 11.5–15.5)
WBC: 23 10*3/uL — AB (ref 4.0–10.5)

## 2017-05-06 MED ORDER — HYDROMORPHONE HCL 2 MG PO TABS
2.0000 mg | ORAL_TABLET | ORAL | Status: DC | PRN
  Administered 2017-05-06 (×2): 2 mg via ORAL
  Filled 2017-05-06 (×2): qty 1

## 2017-05-06 NOTE — Progress Notes (Signed)
Subjective: Postpartum Day 2: Cesarean Delivery Patient without complaints this morning. Tolerating diet. Pain controlled. Breast pumping. Voiding and + flatus  Objective: Vital signs in last 24 hours: Temp:  [98.2 F (36.8 C)-98.5 F (36.9 C)] 98.2 F (36.8 C) (12/18 0807) Pulse Rate:  [58-79] 58 (12/18 0807) Resp:  [16-18] 16 (12/18 0807) BP: (110-131)/(62-85) 110/64 (12/18 0807) SpO2:  [90 %-93 %] 93 % (12/18 0807) Weight:  [60.8 kg (134 lb)] 60.8 kg (134 lb) (12/18 0552)  Physical Exam:  General: alert Lochia: appropriate Uterine Fundus: firm Incision: healing well DVT Evaluation: No evidence of DVT seen on physical exam.  Recent Labs    05/05/17 0650 05/06/17 0506  HGB 8.2* 7.6*  HCT 24.1* 22.1*    Assessment/Plan: Status post Cesarean section. Doing well postoperatively.  Continue current care.  [name] 05/06/2017, 11:03 AM

## 2017-05-06 NOTE — Lactation Note (Signed)
This note was copied from a baby's chart. Lactation Consultation Note  Patient Name: [name] [id number] Date: 05/06/2017 Reason for consult: Follow-up assessment Baby at 41 hr of life. Mom was resting in the bed upon entry. She stated she has not used the DEBP today. Encouraged her to pump 8-12x/24hr.   Maternal Data    Feeding    LATCH Score                   Interventions Interventions: DEBP;Hand pump;Hand express;Breast massage  Lactation Tools Discussed/Used     Consult Status Consult Status: Follow-up Date: 05/07/17 Follow-up type: In-patient    Denzil Hughes 05/06/2017, 1:04 PM

## 2017-05-07 LAB — CBC WITH DIFFERENTIAL/PLATELET
BASOS ABS: 0.2 10*3/uL — AB (ref 0.0–0.1)
BASOS PCT: 1 %
EOS PCT: 4 %
Eosinophils Absolute: 1 10*3/uL — ABNORMAL HIGH (ref 0.0–0.7)
HEMATOCRIT: 24.5 % — AB (ref 36.0–46.0)
Hemoglobin: 8.2 g/dL — ABNORMAL LOW (ref 12.0–15.0)
LYMPHS PCT: 16 %
Lymphs Abs: 3.8 10*3/uL (ref 0.7–4.0)
MCH: 29.2 pg (ref 26.0–34.0)
MCHC: 33.5 g/dL (ref 30.0–36.0)
MCV: 87.2 fL (ref 78.0–100.0)
Monocytes Absolute: 2.4 10*3/uL — ABNORMAL HIGH (ref 0.1–1.0)
Monocytes Relative: 10 %
NEUTROS PCT: 69 %
Neutro Abs: 16.6 10*3/uL — ABNORMAL HIGH (ref 1.7–7.7)
Other: 0 %
PLATELETS: 278 10*3/uL (ref 150–400)
RBC: 2.81 MIL/uL — ABNORMAL LOW (ref 3.87–5.11)
RDW: 25.8 % — ABNORMAL HIGH (ref 11.5–15.5)
WBC: 24 10*3/uL — ABNORMAL HIGH (ref 4.0–10.5)

## 2017-05-07 MED ORDER — SENNOSIDES-DOCUSATE SODIUM 8.6-50 MG PO TABS: 2.0000 | ORAL_TABLET | Freq: Every day | ORAL | 1 refills | Status: DC

## 2017-05-07 MED ORDER — IBUPROFEN 600 MG PO TABS: 600.0000 mg | ORAL_TABLET | Freq: Four times a day (QID) | ORAL | 0 refills | Status: DC

## 2017-05-07 MED ORDER — OXYCODONE HCL 10 MG PO TABS: 5.0000 mg | ORAL_TABLET | Freq: Four times a day (QID) | ORAL | 0 refills | Status: DC | PRN

## 2017-05-07 NOTE — Discharge Instructions (Signed)
Cesarean Delivery, Care After Refer to this sheet in the next few weeks. These instructions provide you with information about caring for yourself after your procedure. Your health care provider may also give you more specific instructions. Your treatment has been planned according to current medical practices, but problems sometimes occur. Call your health care provider if you have any problems or questions after your procedure. What can I expect after the procedure? After the procedure, it is common to have:  A small amount of blood or clear fluid coming from the incision.  Some redness, swelling, and pain in your incision area.  Some abdominal pain and soreness.  Vaginal bleeding (lochia).  Pelvic cramps.  Fatigue.  Follow these instructions at home: Incision care   Follow instructions from your health care provider about how to take care of your incision. Make sure you: ? Wash your hands with soap and water before you change your bandage (dressing). If soap and water are not available, use hand sanitizer. ? If you have a dressing, change it as told by your health care provider. ? Leave stitches (sutures), skin staples, skin glue, or adhesive strips in place. These skin closures may need to stay in place for 2 weeks or longer. If adhesive strip edges start to loosen and curl up, you may trim the loose edges. Do not remove adhesive strips completely unless your health care provider tells you to do that.  Check your incision area every day for signs of infection. Check for: ? More redness, swelling, or pain. ? More fluid or blood. ? Warmth. ? Pus or a bad smell.  When you cough or sneeze, hug a pillow. This helps with pain and decreases the chance of your incision opening up (dehiscing). Do this until your incision heals. Medicines  Take over-the-counter and prescription medicines only as told by your health care provider.  If you were prescribed an antibiotic medicine, take it  as told by your health care provider. Do not stop taking the antibiotic until it is finished. Driving  Do not drive or operate heavy machinery while taking prescription pain medicine. Lifestyle  Do not drink alcohol. This is especially important if you are breastfeeding or taking pain medicine.  Do not use tobacco products, including cigarettes, chewing tobacco, or e-cigarettes. If you need help quitting, ask your health care provider. Tobacco can delay wound healing. Eating and drinking  Drink at least 8 eight-ounce glasses of water every day unless told not to by your health care provider. If you breastfeed, you may need to drink more water than this.  Eat high-fiber foods every day. These foods may help prevent or relieve constipation. High-fiber foods include: ? Whole grain cereals and breads. ? Brown rice. ? Beans. ? Fresh fruits and vegetables. Activity  Return to your normal activities as told by your health care provider. Ask your health care provider what activities are safe for you.  Rest as much as possible. Try to rest or take a nap while your baby is sleeping.  Do not lift anything that is heavier than your baby or 10 lb (4.5 kg) as told by your health care provider.  Ask your health care provider when you can engage in sexual activity. This may depend on your: ? Risk of infection. ? Healing rate. ? Comfort and desire to engage in sexual activity. Bathing  Do not take baths, swim, or use a hot tub until your health care provider approves. Ask your health care provider if   you can take showers. You may only be allowed to take sponge baths until your incision heals. General instructions  Do not use tampons or douches until your health care provider approves.  Wear: ? Loose, comfortable clothing. ? A supportive and well-fitting bra.  Watch for any blood clots that may pass from your vagina. These may look like clumps of dark red, brown, or black discharge.  Keep  your perineum clean and dry as told by your health care provider.  Wipe from front to back when you use the toilet.  If possible, have someone help you care for your baby and help with household activities for a few days after you leave the hospital.  Keep all follow-up visits for you and your baby as told by your health care provider. This is important. Contact a health care provider if:  You have: ? Bad-smelling vaginal discharge. ? Difficulty urinating. ? Pain when urinating. ? A sudden increase or decrease in the frequency of your bowel movements. ? More redness, swelling, or pain around your incision. ? More fluid or blood coming from your incision. ? Pus or a bad smell coming from your incision. ? A fever. ? A rash. ? Little or no interest in activities you used to enjoy. ? Questions about caring for yourself or your baby. ? Nausea.  Your incision feels warm to the touch.  Your breasts turn red or become painful or hard.  You feel unusually sad or worried.  You vomit.  You pass large blood clots from your vagina. If you pass a blood clot, save it to show to your health care provider. Do not flush blood clots down the toilet without showing your health care provider.  You urinate more than usual.  You are dizzy or light-headed.  You have not breastfed and have not had a menstrual period for 12 weeks after delivery.  You stopped breastfeeding and have not had a menstrual period for 12 weeks after stopping breastfeeding. Get help right away if:  You have: ? Pain that does not go away or get better with medicine. ? Chest pain. ? Difficulty breathing. ? Blurred vision or spots in your vision. ? Thoughts about hurting yourself or your baby. ? New pain in your abdomen or in one of your legs. ? A severe headache.  You faint.  You bleed from your vagina so much that you fill two sanitary pads in one hour. This information is not intended to replace advice given to  you by your health care provider. Make sure you discuss any questions you have with your health care provider. Document Released: 01/26/2002 Document Revised: 06/08/2016 Document Reviewed: 04/10/2015 [employer]vier Interactive Patient Education  2018 [employer]  

## 2017-05-07 NOTE — Discharge Summary (Signed)
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 ([place])   Abnormal MSAFP (maternal serum alpha-fetoprotein), elevated   Partial placenta previa - right lateral previa   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[place])   History of preterm delivery, currently pregnant in second trimester   Umbilical vein abnormality affecting pregnancy (varix)   Ulcer of right lower extremity ([place])   Ringworm of body   Abnormal quad screen  Additional problems: Prenatal problems included PPROM at [redacted]w[redacted]d, breech presentation and right lateral previa   Discharge diagnosis: Preterm Pregnancy Delivered                                                                                                Post partum procedures:postpartum tubal ligation  Augmentation: None  Complications: Intrauterine Inflammation or infection (Chorioamniotis) and ROM>24 hours  Hospital course:  Scheduled C/S   46 y.o. yo [id number] at [redacted]w[redacted]d was admitted to the hospital 03/20/2017 for PPROM at [redacted]w[redacted]d.  Her prenatal history had been complicated by right lateral previa, AMA, sickle cell disease, history of C-section x2, and umbilical vein varix.  During hospitalization, she received latency antibiotics, 2 rounds of betamethasone, and magnesium for neuro prophylaxis. Patient was hospitalized for 45 days prior to C-section. During that time patient had reactive NSTs and appropriate BPP. Growth ultrasounds were normal. Started having large  amounts of dark green vaginal discharge with worsening abdominal pain the day of delivery. Was taken to OR for breech presentation and suspected chorioamnionitis.  Cesarean section with the following indication:Elective Repeat and Malpresentation.  Membrane Rupture Time/Date: Unknown, was having leaking prior to admission. Patient delivered a Viable infant.05/04/2017. Infant taken to NICU with respiratory distress requiring intubation.   Details of operation can be found in separate operative note.  Pateint had an uncomplicated postpartum course.  She is ambulating, tolerating a regular diet, passing flatus, and urinating well. Patient is discharged home in stable condition on  05/07/17.          Physical exam  Vitals:   05/06/17 0807 05/06/17 2018 05/07/17 0639 05/07/17 0750  BP: 110/64 134/71  115/68  Pulse: (!) 58 89  64  Resp: 16 18  18   Temp: 98.2 F (36.8 C) (!) 97.4 F (36.3 C)  98.2 F (36.8 C)  TempSrc: Oral Oral  Oral  SpO2: 93% 92%  93%  Weight:   137 lb 8 oz (62.4 kg)   Height:       General: alert, cooperative and no distress Lochia: appropriate Uterine Fundus: firm Incision: Dressing is clean, dry, and intact DVT Evaluation: No evidence of DVT seen on  physical exam. Labs: Lab Results  Component Value Date   WBC 24.0 (H) 05/07/2017   HGB 8.2 (L) 05/07/2017   HCT 24.5 (L) 05/07/2017   MCV 87.2 05/07/2017   PLT 278 05/07/2017   CMP Latest Ref Rng & Units 03/25/2017  Glucose 65 - 99 mg/dL 114(H)  BUN 6 - 20 mg/dL <5(L)  Creatinine 0.44 - 1.00 mg/dL 0.32(L)  Sodium 135 - 145 mmol/L 136  Potassium 3.5 - 5.1 mmol/L 3.7  Chloride 101 - 111 mmol/L 105  CO2 22 - 32 mmol/L 24  Calcium 8.9 - 10.3 mg/dL 9.4  Total Protein 6.5 - 8.1 g/dL -  Total Bilirubin 0.3 - 1.2 mg/dL -  Alkaline Phos 38 - 126 U/L -  AST 15 - 41 U/L -  ALT 14 - 54 U/L -    Discharge instruction: per After Visit Summary and "Baby and Me Booklet".  After visit meds:  Allergies as of 05/07/2017    No Known Allergies     Medication List    TAKE these medications   aspirin EC 81 MG tablet Take 1 tablet (81 mg total) by mouth daily. Take after 12 weeks for prevention of preeclampsia later in pregnancy   Clobetasol Prop Emollient Base 0.05 % emollient cream Commonly known as:  CLOBETASOL PROPIONATE E Apply 1 application topically 2 (two) times daily.   ENSURE ORIGINAL Liqd Take 1 Bottle by mouth daily as needed.   folic acid 1 MG tablet Commonly known as:  FOLVITE Take 5 tablets (5 mg total) by mouth daily.   ibuprofen 600 MG tablet Commonly known as:  ADVIL,MOTRIN Take 1 tablet (600 mg total) by mouth every 6 (six) hours.   Oxycodone HCl 10 MG Tabs Take 0.5 tablets (5 mg total) by mouth every 6 (six) hours as needed for severe pain. What changed:    how much to take  when to take this  reasons to take this   Prenatal Vitamins 0.8 MG tablet Take 1 tablet by mouth daily.   RESTA SILVER Gel Apply 1 application topically daily. Applies to leg   senna-docusate 8.6-50 MG tablet Commonly known as:  Senokot-S Take 2 tablets by mouth at bedtime.   Vitamin D 2000 units Caps Take 2 capsules (4,000 Units total) by mouth daily.       Diet: routine diet  Activity: Advance as tolerated. Pelvic rest for 6 weeks.   Outpatient follow up:2 weeks Follow up Appt:No future appointments. Follow up Visit: [place] [facility]. Schedule an appointment as soon as possible for a visit.   Specialty:  Obstetrics and Gynecology Why:  For psotpartum visit in 4 weeks Contact information: [place] [phone]          Postpartum contraception: Tubal Ligation  Newborn Data: Live born female  Birth Weight: 2 lb 11.4 oz (1230 g) APGAR: 5, 7  Newborn Delivery   Birth date/time:  05/04/2017 19:18:00 Delivery type:  C-Section, Low Transverse C-section categorization:  Repeat     Baby  Feeding: Bottle Disposition:NICU   05/07/2017 Luiz Blare, DO

## 2017-05-07 NOTE — Lactation Note (Signed)
This note was copied from a baby's chart. Lactation Consultation Note  Patient Name: [name] GHWEX'H Date: 05/07/2017   Provided mother with [facility] loaner due back on 12/31.  Encouraged mother to contact [facility] for long term loaner. Reminded mother to pump q 2-3 hours. Reviewed milk storage and transportation.       Maternal Data    Feeding    LATCH Score                   Interventions    Lactation Tools Discussed/Used     Consult Status      Vivianne Master [facility] 05/07/2017, 12:08 PM

## 2017-05-07 NOTE — Progress Notes (Signed)
Pt teaching complete  Ambulated out

## 2017-05-09 LAB — TYPE AND SCREEN
ABO/RH(D): B POS
ANTIBODY SCREEN: NEGATIVE
Unit division: 0
Unit division: 0
Unit division: 0

## 2017-05-09 LAB — BPAM RBC
Blood Product Expiration Date: 201812272359
Blood Product Expiration Date: 201901082359
Blood Product Expiration Date: 201901082359
UNIT TYPE AND RH: 9500
Unit Type and Rh: 1700
Unit Type and Rh: 7300

## 2017-05-09 NOTE — Clinical Social Work Maternal (Signed)
CLINICAL SOCIAL WORK MATERNAL/CHILD NOTE  Patient Details  Name: [name] MRN: [id number] Date of Birth: 06/18/1970  Date:  05/09/2017  Clinical Social Worker Initiating Note:  Laretha Luepke Boyd-Gilyard Date/Time: Initiated:  05/07/17/1312     Child's Name:  [name]   Biological Parents:  Mother, Father   Need for Interpreter:  None   Reason for Referral:  Parental Support of Premature Babies < 32 weeks/or Critically Ill babies   Address:  [address] [place] [address]    Phone number:  [phone] (home)     Additional phone number: FOB number is [phone]  Household Members/Support Persons (HM/SP):   Household Member/Support Person 1, Household Member/Support Person 2, Household Member/Support Person 3   HM/SP Name Relationship DOB or Age  HM/SP -1 Moussa [name] Son 04/22/2016  HM/SP -2 Hawa Kouilbaay  daughter 12/14/2010  HM/SP -3 Aboubakai [name] FOB/Husband unknown  HM/SP -4        HM/SP -5        HM/SP -6        HM/SP -7        HM/SP -8          Natural Supports (not living in the home):  Friends, Extended Family   Professional Supports: None   Employment: Full-time   Type of Work: CNA   Education:  Vocation/technical training   Homebound arranged:    Financial Resources:  Medicaid   Other Resources:  Food Stamps , WIC   Cultural/Religious Considerations Which May Impact Care:  Per MOB Face Sheet, MOB is Muslim.   Strengths:  Ability to meet basic needs , Home prepared for child    Psychotropic Medications:         Pediatrician:       Pediatrician List:   [place]    [place]    [place]    [place]    [place]    [place]      Pediatrician Fax Number:    Risk Factors/Current Problems:  None   Cognitive State:  Alert , Able to Concentrate , Linear Thinking    Mood/Affect:  Relaxed , Flat , Calm , Comfortable , Interested    CSW Assessment: CSW met with MOB in  the room 317 to complete an assessment for NICU admission. MOB appeared flat but receptive to meeting with CSW.    CSW inquired about MOB's thoughts and feelings since giving birth.  MOB became tearful and shared "I just pray my baby will be ok." CSW encouraged MOB to speak with medical team anytime that MOB has any questions about infant's health. MOB stated that this baby was infant's 3rd baby in the NICU and MOB was familiar with infant NICU (MOB oldest chile born ant 36 weeks and 2nd child at 34 weeks).  MOB appeared to have an understanding of infant's medical condition and denied having any questions.   CSW provided education regarding the baby blues period vs. perinatal mood disorders, discussed treatment and gave resources for mental health follow up if concerns arise.  CSW recommends self-evaluation during the postpartum time period using the New Mom Checklist from Postpartum Progress and encouraged MOB to contact a medical professional if symptoms are noted at any time. CSW assessed for safety and MOB denied SI and HI.  MOB expressed feeling comfortable seeking help if help is needed.     CSW also asked about supports for MOB and family and MOB denied having any supports.  CSW   asked about visitors that visited with MOB previously and MOB communicated, "No I don't have any other supports."   MOB denied having barriers to visit with infant and plans to visit as often as possible. MOB also stated that MOB will have all necessary items for infant prior to infants d/c.   CSW provided MOB with information to add infant on to MOB's Food Stamp, Medicaid, and WIC application.  CSW also discussed that infant qualifies for SSI due to low birth weight and gestational weeks.  MOB signed request of access and was given copy of admission summary in effort to apply for SSI once SSN has been received.   CSW will continue to provide support to the family and assess for psychosocial needs while infant remains in  NICU.   CSW Plan/Description:  Psychosocial Support and Ongoing Assessment of Needs, Perinatal Mood and Anxiety Disorder (PMADs) Education, Supplemental Security Income (SSI) Information, Other Information/Referral to Community Resources   Shalin Vonbargen Boyd-Gilyard, MSW, LCSW Clinical Social Work [phone]  Rosiland Sen D BOYD-GILYARD, LCSW 05/09/2017, 1:17 PM 

## 2017-05-14 ENCOUNTER — Telehealth: Payer: Self-pay | Admitting: General Practice

## 2017-05-14 ENCOUNTER — Ambulatory Visit (INDEPENDENT_AMBULATORY_CARE_PROVIDER_SITE_OTHER): Payer: Medicaid Other | Admitting: Family Medicine

## 2017-05-14 ENCOUNTER — Encounter: Payer: Self-pay | Admitting: Family Medicine

## 2017-05-14 ENCOUNTER — Encounter: Payer: Self-pay | Admitting: General Practice

## 2017-05-14 VITALS — BP 130/71 | HR 85 | Temp 98.3°F | Resp 14 | Ht 67.0 in | Wt 120.0 lb

## 2017-05-14 DIAGNOSIS — R82998 Other abnormal findings in urine: Secondary | ICD-10-CM | POA: Diagnosis not present

## 2017-05-14 DIAGNOSIS — D571 Sickle-cell disease 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: Secondary | ICD-10-CM

## 2017-05-14 LAB — POCT URINALYSIS DIP (DEVICE)
BILIRUBIN URINE: NEGATIVE
GLUCOSE, UA: NEGATIVE mg/dL
Ketones, ur: NEGATIVE mg/dL
NITRITE: NEGATIVE
Protein, ur: 30 mg/dL — AB
Specific Gravity, Urine: 1.015 (ref 1.005–1.030)
UROBILINOGEN UA: 0.2 mg/dL (ref 0.0–1.0)
pH: 6 (ref 5.0–8.0)

## 2017-05-14 MED ORDER — IBUPROFEN 600 MG PO TABS: 600.0000 mg | ORAL_TABLET | Freq: Four times a day (QID) | ORAL | 2 refills | Status: DC

## 2017-05-14 MED ORDER — OXYCODONE HCL 10 MG PO TABS: 10.0000 mg | ORAL_TABLET | Freq: Four times a day (QID) | ORAL | 0 refills | Status: DC | PRN

## 2017-05-14 MED FILL — IBUPROFEN 600 MG TABLET: 600 | 7 days supply | Qty: 30 | Fill #0

## 2017-05-14 MED FILL — FOLIC ACID 1 MG TABLET: 1 | 30 days supply | Qty: 150 | Fill #2

## 2017-05-14 NOTE — Patient Instructions (Signed)
Will continue Oxycodone 10 mg every 6 hours as needed for moderate to severe pain.  Continue to hydrate with 64 ounces of water daily.    Sickle Cell Anemia, Adult Sickle cell anemia is a condition where your red blood cells are shaped like sickles. Red blood cells carry oxygen through the body. Sickle-shaped red blood cells do not live as long as normal red blood cells. They also clump together and block blood from flowing through the blood vessels. These things prevent the body from getting enough oxygen. Sickle cell anemia causes organ damage and pain. It also increases the risk of infection. Follow these instructions at home:  Drink enough fluid to keep your pee (urine) clear or pale yellow. Drink more in hot weather and during exercise.  Do not smoke. Smoking lowers oxygen levels in the blood.  Only take over-the-counter or prescription medicines as told by your doctor.  Take antibiotic medicines as told by your doctor. Make sure you finish them even if you start to feel better.  Take supplements as told by your doctor.  Consider wearing a medical alert bracelet. This tells anyone caring for you in an emergency of your condition.  When traveling, keep your medical information, doctors' names, and the medicines you take with you at all times.  If you have a fever, do not take fever medicines right away. This could cover up a problem. Tell your doctor.  Keep all follow-up visits with your doctor. Sickle cell anemia requires regular medical care. Contact a doctor if: You have a fever. Get help right away if:  You feel dizzy or faint.  You have new belly (abdominal) pain, especially on the left side near the stomach area.  You have a lasting, often uncomfortable and painful erection of the penis (priapism). If it is not treated right away, you will become unable to have sex (impotence).  You have numbness in your arms or legs or you have a hard time moving them.  You have a hard  time talking.  You have a fever or lasting symptoms for more than 2-3 days.  You have a fever and your symptoms suddenly get worse.  You have signs or symptoms of infection. These include: ? Chills. ? Being more tired than normal (lethargy). ? Irritability. ? Poor eating. ? Throwing up (vomiting).  You have pain that is not helped with medicine.  You have shortness of breath.  You have pain in your chest.  You are coughing up pus-like or bloody mucus.  You have a stiff neck.  Your feet or hands swell or have pain.  Your belly looks bloated.  Your joints hurt. This information is not intended to replace advice given to you by your health care provider. Make sure you discuss any questions you have with your health care provider. Document Released: 02/24/2013 Document Revised: 10/12/2015 Document Reviewed: 12/16/2012 Elsevier Interactive Patient Education  2017 [employer].

## 2017-05-14 NOTE — Telephone Encounter (Signed)
Called and notified patient of RN visit appointment on 05/22/16 at 11:00am for incision check.  Patient voiced understanding.

## 2017-05-15 LAB — CBC
HEMATOCRIT: 27.1 % — AB (ref 34.0–46.6)
HEMOGLOBIN: 8.9 g/dL — AB (ref 11.1–15.9)
MCH: 28.2 pg (ref 26.6–33.0)
MCHC: 32.8 g/dL (ref 31.5–35.7)
MCV: 86 fL (ref 79–97)
NRBC: 9 % — AB (ref 0–0)
Platelets: 462 10*3/uL — ABNORMAL HIGH (ref 150–379)
RBC: 3.16 x10E6/uL — ABNORMAL LOW (ref 3.77–5.28)
RDW: 23.8 % — AB (ref 12.3–15.4)
WBC: 14.4 10*3/uL — AB (ref 3.4–10.8)

## 2017-05-15 LAB — CMP AND LIVER
ALK PHOS: 107 IU/L (ref 39–117)
ALT: 26 IU/L (ref 0–32)
AST: 79 IU/L — AB (ref 0–40)
Albumin: 4 g/dL (ref 3.5–5.5)
BILIRUBIN, DIRECT: 0.53 mg/dL — AB (ref 0.00–0.40)
BUN: 6 mg/dL (ref 6–24)
Bilirubin Total: 2.1 mg/dL — ABNORMAL HIGH (ref 0.0–1.2)
CO2: 18 mmol/L — AB (ref 20–29)
CREATININE: 0.41 mg/dL — AB (ref 0.57–1.00)
Calcium: 8.9 mg/dL (ref 8.7–10.2)
Chloride: 107 mmol/L — ABNORMAL HIGH (ref 96–106)
GFR calc Af Amer: 144 mL/min/{1.73_m2} (ref >59)
GFR calc non Af Amer: 125 mL/min/{1.73_m2} (ref >59)
Glucose: 79 mg/dL (ref 65–99)
POTASSIUM: 5.4 mmol/L — AB (ref 3.5–5.2)
Sodium: 142 mmol/L (ref 134–144)
Total Protein: 6.8 g/dL (ref 6.0–8.5)

## 2017-05-16 LAB — URINE CULTURE

## 2017-05-16 IMAGING — DX DG TIBIA/FIBULA 2V*R*
2 series · 2 of 2 positions shown · non-contrast
Comparison: 09/27/2015

CLINICAL DATA: Nonhealing wounds on lower leg

EXAM:
RIGHT TIBIA AND FIBULA - 2 VIEW

[tibia ap]
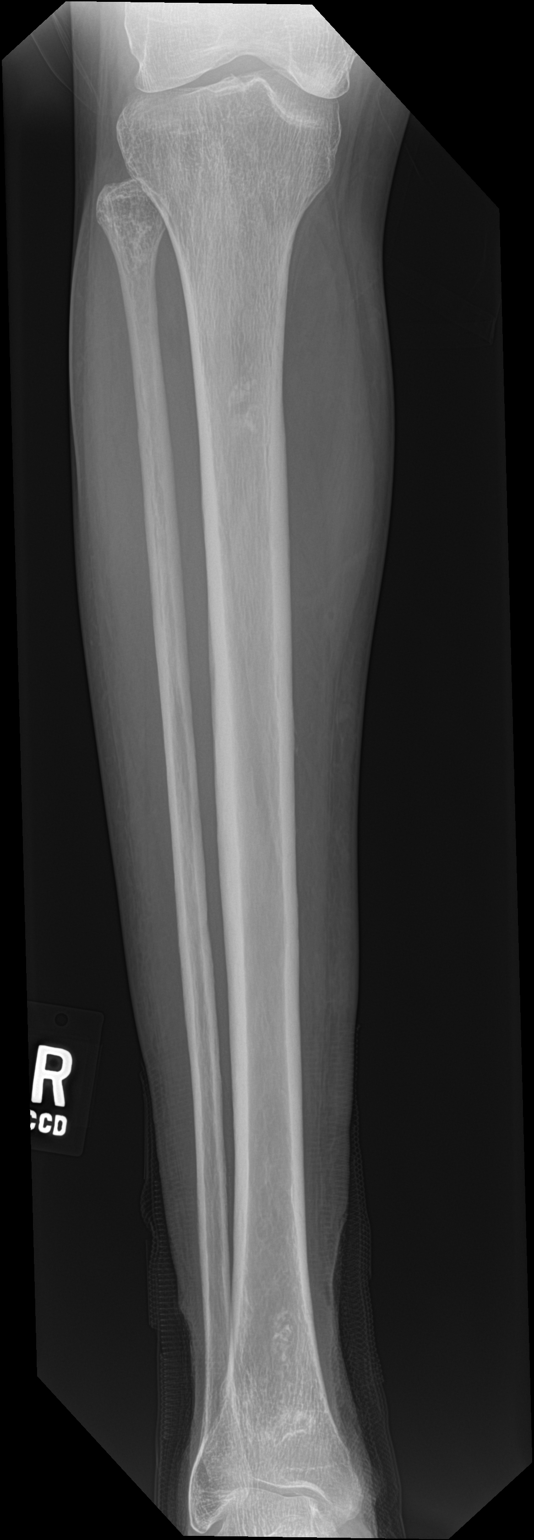

[tibia lat]
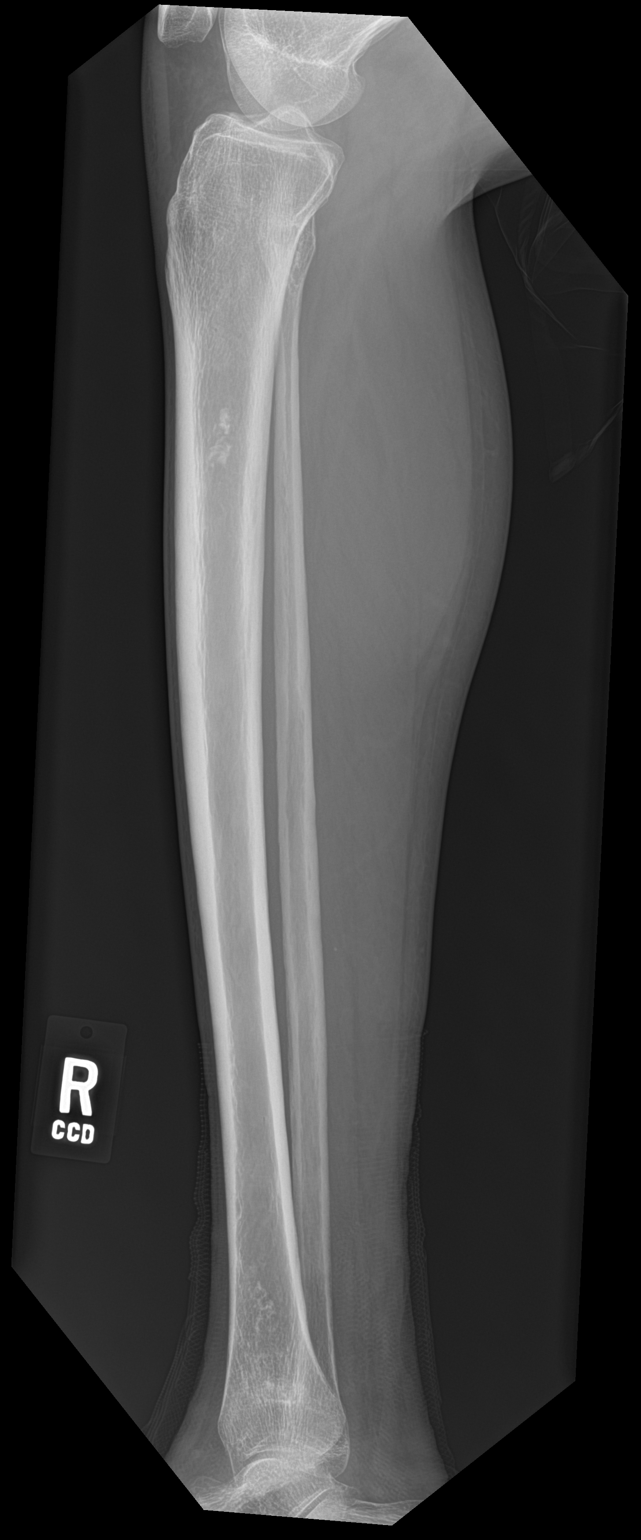

[2 of 2 positions shown; findings below may reference images not displayed]

FINDINGS: No acute bony abnormality. No fracture, subluxation or dislocation.
No radiographic changes of osteomyelitis.
IMPRESSION: No acute bony abnormality.

## 2017-05-16 IMAGING — DX DG TIBIA/FIBULA 2V*L*
3 series · 3 of 3 positions shown · non-contrast
Comparison: None.

CLINICAL DATA: Nonhealing wounds, sickle cell disease

EXAM:
LEFT TIBIA AND FIBULA - 2 VIEW

[tibia ap]
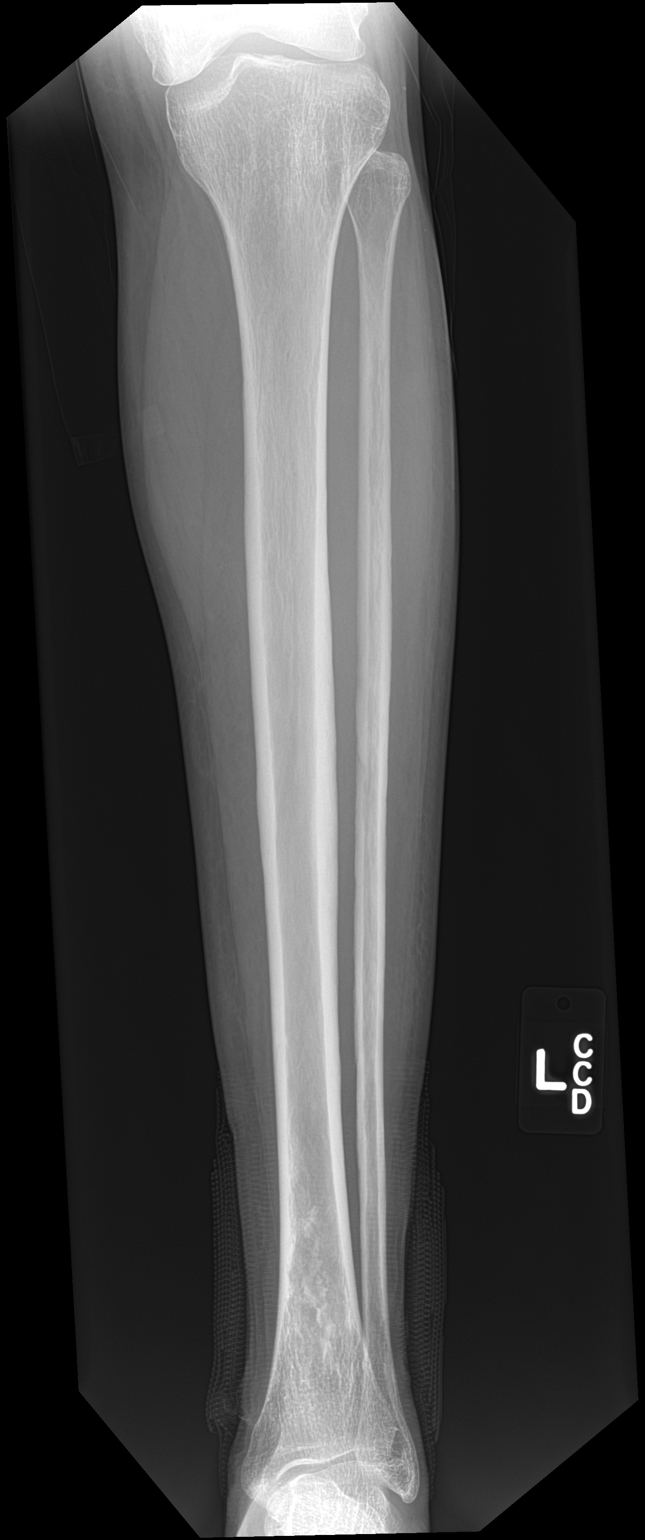

[tibia lat (1 of 2)]
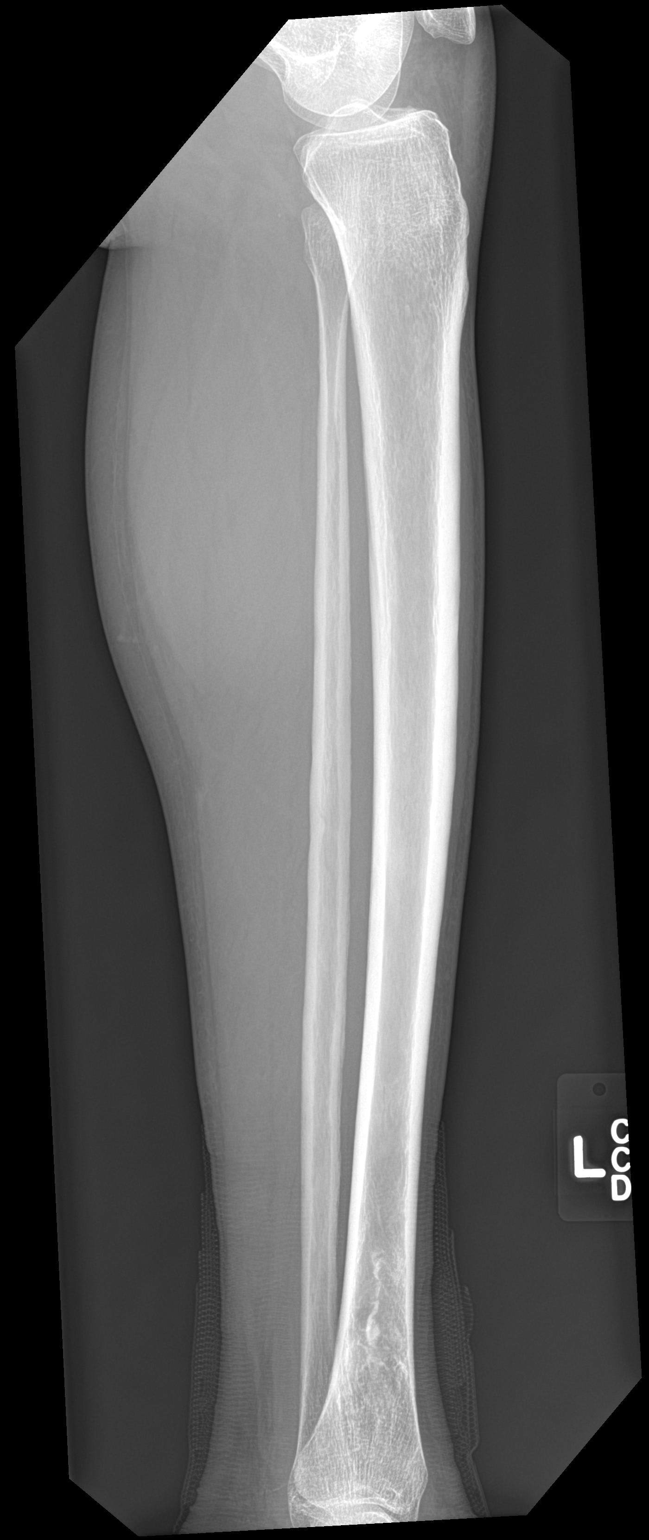

[tibia lat (2 of 2)]
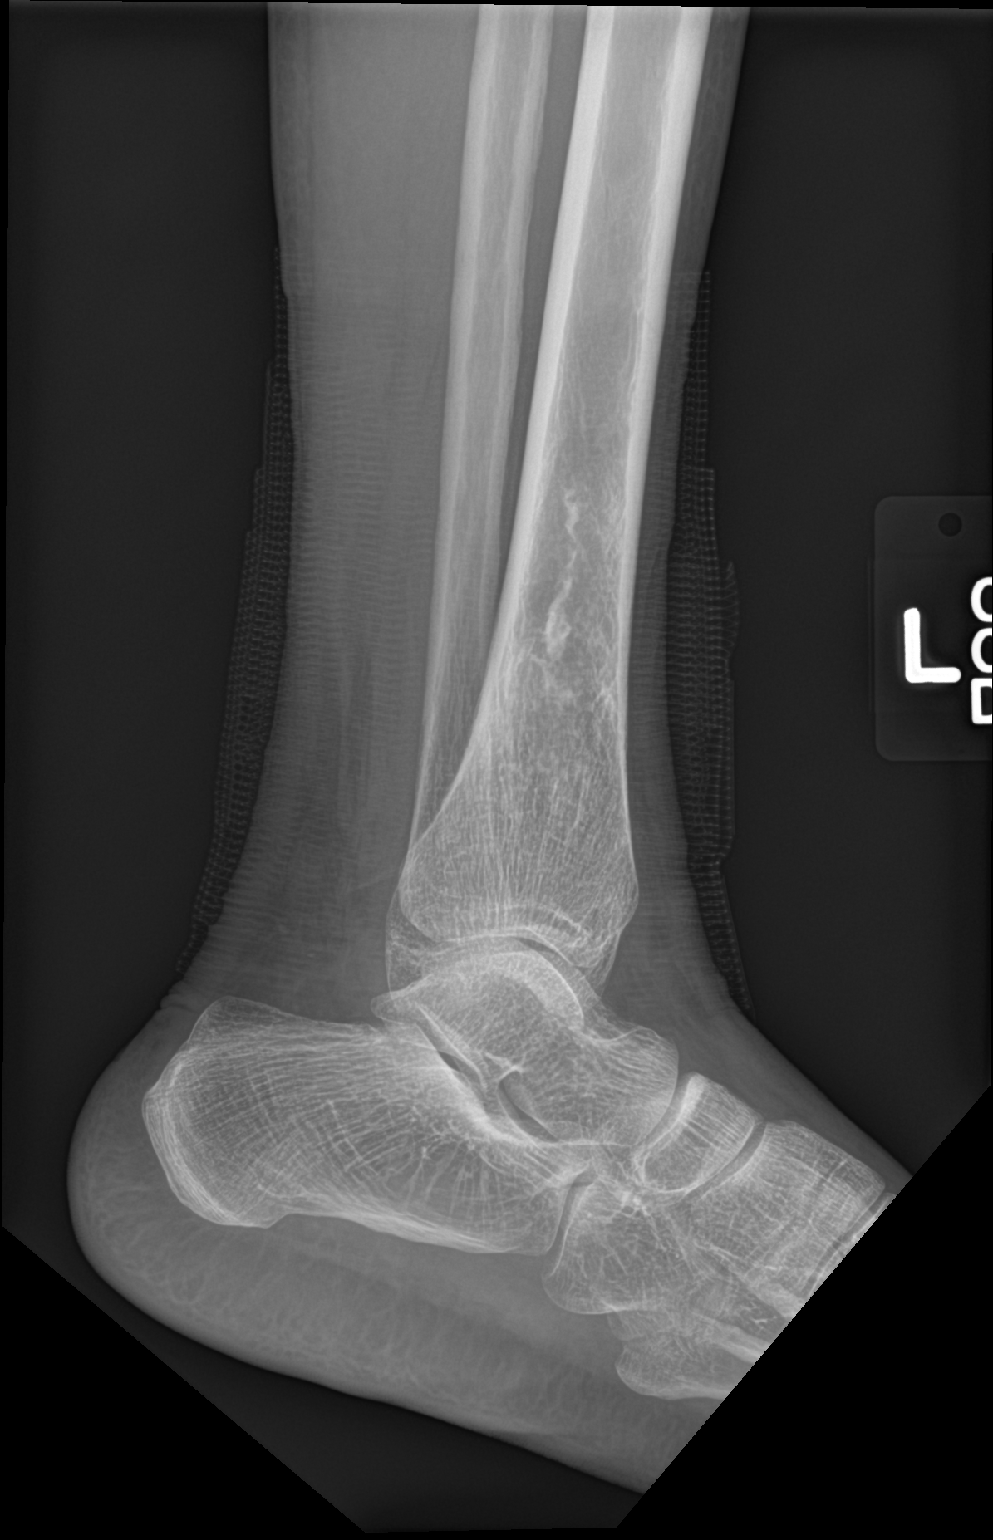

[3 of 3 positions shown; findings below may reference images not displayed]

FINDINGS: No radiographic evidence of osteomyelitis is seen. No periosteal
reaction is noted. A sclerotic lesion within the distal left tibiah
is typical of bone infarct in this patient with sickle cell disease.
IMPRESSION: No radiographic evidence of osteomyelitis.

## 2017-05-16 MED FILL — oxyCODONE HCL 10 MG TABS: 10 | 22 days supply | Qty: 90 | Fill #0

## 2017-05-16 NOTE — Progress Notes (Signed)
Called and spoke with patient. Advised that liver enzymes were going up and to avoid otc tylenol and alcohol. Patient verbalized understanding and was advised to keep next scheduled appointment. Thanks!

## 2017-05-16 NOTE — Progress Notes (Signed)
[name], a 46 year old female with a history of sickle cell anemia and chronic lower extremity ulcers presents for a post hospital follow up. Patient was hospitalized from 03/21/2007 to 33/82/5053 due to complications of pregnancy. Patient was hospitalized for 45 days prior to cesarean section. Patient is scheduled to follow up at [place] for [facility] for post partum visit. She endorses soreness to cesarean incision. She denies redness or drainage. She continues to express milk in order to obtain breast milk for newborn in the NICU.  Patient is also following up for sickle cell anemia, HbSS. She endorses pain to lower extremities that are consistent with chronic sickle cell pain. Pain intensity is 5/10 characterized as intermittent and aching. She last had Oxycodone this am with moderate relief. She continues to take prenatal vitamins and folic acid. Patient has increased hydration to 64 ounces per day. She currently denies headache, chest pain, paresthesias, nausea, vomiting, or diarrhea.   Past Medical History:  Diagnosis Date  . Anemia 03/30/2012   Hx of sickle cell disease  . CAP (community acquired pneumonia)    2014  . Cholelithiasis 03/30/2012  . Chronic wound of extremity 04/01/2012  . Elevated LFTs   . Leg ulcer ([place]) 10/27/2012   Chronic under care of wound clinic  . Multiple open wounds of lower extremity    chronic wounds B/LLE  . Sickle cell disease ([place])   . Sinus bradycardia by electrocardiogram 04/03/2012   Social History   Socioeconomic History  . Marital status: Married    Spouse name: [name]  . Number of children: 1  . Years of education: Not on file  . Highest education level: Not on file  Social Needs  . Financial resource strain: Not on file  . Food insecurity - worry: Not on file  . Food insecurity - inability: Not on file  . Transportation needs - medical: Not on file  . Transportation needs - non-medical: Not on file  Occupational  History  . Occupation: Employed in home care.  Tobacco Use  . Smoking status: Never Smoker  . Smokeless tobacco: Never Used  Substance and Sexual Activity  . Alcohol use: No  . Drug use: No  . Sexual activity: Yes    Birth control/protection: None  Other Topics Concern  . Not on file  Social History Narrative   Lives with husband.   Immunization History  Administered Date(s) Administered  . Influenza,inj,Quad PF,6+ Mos 03/16/2013, 01/20/2014, 03/01/2015, 02/19/2016, 02/14/2017  . Pneumococcal Conjugate-13 12/14/2015  . Pneumococcal Polysaccharide-23 03/24/2014  . Tdap 11/25/2013   Review of Systems  Constitutional: Negative for fever and weight loss.  HENT: Negative.   Genitourinary: Negative.   Musculoskeletal: Positive for joint pain (bilateral lower extremities).  Skin: Negative.        Bilateral leg ulcers  Neurological: Negative.   Endo/Heme/Allergies: Negative.   Psychiatric/Behavioral: Negative.  Negative for depression and suicidal ideas.  Physical Exam  Constitutional: She is oriented to person, place, and time.  HENT:  Head: Normocephalic and atraumatic.  Right Ear: External ear normal.  Left Ear: External ear normal.  Nose: Nose normal.  Mouth/Throat: Oropharynx is clear and moist.  Eyes: Conjunctivae are normal. Pupils are equal, round, and reactive to light.  Neck: Normal range of motion. Neck supple.  Abdominal: There is tenderness.    Non draining, tender to palpation  Neurological: She is alert and oriented to person, place, and time. Gait normal.  Skin:     80% granulation  to left lower extremity, non draining  BP 130/71 (BP Location: Left Arm, Patient Position: Sitting, Cuff Size: Normal)   Pulse 85   Temp 98.3 F (36.8 C) (Oral)   Resp 14   Ht 5\' 7"  (1.702 m)   Wt 120 lb (54.4 kg)   LMP 10/10/2016 Comment: patient double shielded  SpO2 96%   BMI 18.79 kg/m    Hb-SS disease 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 (HCC) Continue folic acid 1 mg daily to prevent  aplastic bone marrow crises.   Pulmonary evaluation - Patient denies severe recurrent wheezes, shortness of breath with exercise, or persistent cough. If these symptoms develop, pulmonary function tests with spirometry will be ordered, and if abnormal, plan on referral to Pulmonology for further evaluation.  Cardiac - Routine screening for pulmonary hypertension is not recommended.   Eye - High risk of proliferative retinopathy. Annual eye exam with retinal exam recommended to patient.  Acute and chronic painful episodes - Will continue Oxycodone 10 mg every 4 hours as needed for moderate to severe pain.  We discussed that pt is to receive her Schedule II prescriptions only from [place]. Pt is also aware that the prescription history is available to [place] online through the [facility]  PMP Aware. Controlled substance agreement signed previously.  We reminded [name] all patients receiving Schedule II narcotics must be seen for follow within one month of prescription being requested. We reviewed the terms of our pain agreement, including the need to keep medicines in a safe locked location away from children or pets, and the need to report excess sedation or constipation, measures to avoid constipation, and policies related to early refills and stolen prescriptions. According to the [place] Chronic Pain [facility] program, we have reviewed details related to analgesia, adverse effects, aberrant behaviors.    - Oxycodone HCl 10 MG TABS; Take 1 tablet (10 mg total) by mouth every 6 (six) hours as needed.  Dispense: 90 tablet; Refill: 0 - CMP and Liver - CBC - ibuprofen (ADVIL,MOTRIN) 600 MG tablet; Take 1 tablet (600 mg total) by mouth every 6 (six) hours.  Dispense: 30 tablet; Refill: 2 - POCT urinalysis dip (device)   Urine leukocytes - Urine Culture   RTC: 2 months for medication management   [name]  MSN, FNP-C Patient [place] [address] [place],  [place] [address] [phone]

## 2017-05-22 ENCOUNTER — Ambulatory Visit: Payer: Medicaid Other

## 2017-05-22 VITALS — BP 120/76 | HR 91

## 2017-05-22 DIAGNOSIS — Z5189 Encounter for other specified aftercare: Secondary | ICD-10-CM

## 2017-05-22 NOTE — Progress Notes (Signed)
Patient presented to the office for 2 her two week post op incision check. Incision is healing well. The left side of the incision is slightly separated. Patient states that she is experiencing little pain but denies drainage and fever. Per Dr. Elly Modena, the wound will continue to close. Patient advised to  keep wound clean and dry and to return for scheduled PP visit.Patient verbalized understanding.

## 2017-05-26 NOTE — Progress Notes (Signed)
Agree with nursing staff's documentation of this patient's clinic encounter.  Mora Bellman, MD 05/26/2017 9:06 AM

## 2017-06-02 ENCOUNTER — Ambulatory Visit: Payer: Medicaid Other | Admitting: Clinical

## 2017-06-02 ENCOUNTER — Ambulatory Visit (INDEPENDENT_AMBULATORY_CARE_PROVIDER_SITE_OTHER): Payer: Medicaid Other | Admitting: Obstetrics & Gynecology

## 2017-06-02 DIAGNOSIS — Z1389 Encounter for screening for other disorder: Secondary | ICD-10-CM

## 2017-06-02 NOTE — Progress Notes (Signed)
Subjective:     [name] is a 47 y.o. female who presents for a postpartum visit. She is 4 weeks postpartum following a low cervical transverse Cesarean section. I have fully reviewed the prenatal and intrapartum course. The delivery was at 29 gestational weeks. Outcome: repeat cesarean section, low transverse incision. Anesthesia: spinal. Postpartum course has been unremarkable. Baby's course has been remarkable for prematurity, remains in NICU at this time. Baby is feeding by both breast and bottle - [place]. Bleeding staining only. Bowel function is normal. Bladder function is normal. Patient is not sexually active. Contraception method is tubal ligation. Postpartum depression screening: positive. Patient declines to see IBH today.  The following portions of the patient's history were reviewed and updated as appropriate: allergies, current medications, past family history, past medical history, past social history, past surgical history and problem list. Past Surgical History:  Procedure Laterality Date  . ALLOGRAFT APPLICATION Right 7/51/7001   Procedure: SURGICAL PREP FOR GRAFTING RIGHT LOWER EXTREMITY AND APPLICATION OF Jannifer Hick;  Surgeon: Irene Limbo, MD;  Location: [facility];  Service: Plastics;  Laterality: Right;  . APPLICATION OF A-CELL OF EXTREMITY Right 10/09/2015   Procedure: APPLICATION OF Jannifer Hick;  Surgeon: Irene Limbo, MD;  Location: [facility];  Service: Plastics;  Laterality: Right;  . BREAST SURGERY     left breast cyst aspiration  . CESAREAN SECTION    . CESAREAN SECTION N/A 01/06/2014   Procedure: CESAREAN SECTION;  Surgeon: Emily Filbert, MD;  Location: [facility];  Service: Obstetrics;  Laterality: N/A;  . CESAREAN SECTION N/A 05/04/2017   Procedure: CESAREAN SECTION;  Surgeon: Woodroe Mode, MD;  Location: [facility];  Service: Obstetrics;  Laterality: N/A;  . I&D EXTREMITY Right 10/09/2015   Procedure: SURGICAL  PREPARATION FOR GRAFTING RIGHT ANKLE AND APPLICATION THERASKIN;  Surgeon: Irene Limbo, MD;  Location: [facility];  Service: Plastics;  Laterality: Right;  . SKIN FULL THICKNESS GRAFT Bilateral 06/17/2016   Procedure: SURGICAL PREP FOR GRAFTING, BILATERAL LOWER EXTREMITIES AND APPLICATION OF Jannifer Hick;  Surgeon: Irene Limbo, MD;  Location: [facility];  Service: Plastics;  Laterality: Bilateral;  . TONSILLECTOMY     OB History    Gravida Para Term Preterm AB Living   8 5 1 4 3 3    SAB TAB Ectopic Multiple Live Births   3     0 4      Review of Systems Pertinent items are noted in HPI.   Objective:    LMP 10/10/2016 Comment: patient double shielded  General:  no distress           Abdomen: soft, non-tender; bowel sounds normal; no masses,  no organomegaly and incision intact, scant serous drainage on left with no redness, mild tenderness   Vulva:  not evaluated  Vagina: not evaluated                    Assessment:     normal 4 week postpartum exam. Pap smear not done at today's visit.   Plan:    1. Contraception: tubal ligation 2. Expect continued wound healing and less pain 3. Follow up as needed.    Woodroe Mode, MD 06/02/2017

## 2017-06-02 NOTE — Patient Instructions (Signed)
Cesarean Delivery, Care After Refer to this sheet in the next few weeks. These instructions provide you with information about caring for yourself after your procedure. Your health care provider may also give you more specific instructions. Your treatment has been planned according to current medical practices, but problems sometimes occur. Call your health care provider if you have any problems or questions after your procedure. What can I expect after the procedure? After the procedure, it is common to have:  A small amount of blood or clear fluid coming from the incision.  Some redness, swelling, and pain in your incision area.  Some abdominal pain and soreness.  Vaginal bleeding (lochia).  Pelvic cramps.  Fatigue.  Follow these instructions at home: Incision care   Follow instructions from your health care provider about how to take care of your incision. Make sure you: ? Wash your hands with soap and water before you change your bandage (dressing). If soap and water are not available, use hand sanitizer. ? If you have a dressing, change it as told by your health care provider. ? Leave stitches (sutures), skin staples, skin glue, or adhesive strips in place. These skin closures may need to stay in place for 2 weeks or longer. If adhesive strip edges start to loosen and curl up, you may trim the loose edges. Do not remove adhesive strips completely unless your health care provider tells you to do that.  Check your incision area every day for signs of infection. Check for: ? More redness, swelling, or pain. ? More fluid or blood. ? Warmth. ? Pus or a bad smell.  When you cough or sneeze, hug a pillow. This helps with pain and decreases the chance of your incision opening up (dehiscing). Do this until your incision heals. Medicines  Take over-the-counter and prescription medicines only as told by your health care provider.  If you were prescribed an antibiotic medicine, take it  as told by your health care provider. Do not stop taking the antibiotic until it is finished. Driving  Do not drive or operate heavy machinery while taking prescription pain medicine. Lifestyle  Do not drink alcohol. This is especially important if you are breastfeeding or taking pain medicine.  Do not use tobacco products, including cigarettes, chewing tobacco, or e-cigarettes. If you need help quitting, ask your health care provider. Tobacco can delay wound healing. Eating and drinking  Drink at least 8 eight-ounce glasses of water every day unless told not to by your health care provider. If you breastfeed, you may need to drink more water than this.  Eat high-fiber foods every day. These foods may help prevent or relieve constipation. High-fiber foods include: ? Whole grain cereals and breads. ? Brown rice. ? Beans. ? Fresh fruits and vegetables. Activity  Return to your normal activities as told by your health care provider. Ask your health care provider what activities are safe for you.  Rest as much as possible. Try to rest or take a nap while your baby is sleeping.  Do not lift anything that is heavier than your baby or 10 lb (4.5 kg) as told by your health care provider.  Ask your health care provider when you can engage in sexual activity. This may depend on your: ? Risk of infection. ? Healing rate. ? Comfort and desire to engage in sexual activity. Bathing  Do not take baths, swim, or use a hot tub until your health care provider approves. Ask your health care provider if   you can take showers. You may only be allowed to take sponge baths until your incision heals. General instructions  Do not use tampons or douches until your health care provider approves.  Wear: ? Loose, comfortable clothing. ? A supportive and well-fitting bra.  Watch for any blood clots that may pass from your vagina. These may look like clumps of dark red, brown, or black discharge.  Keep  your perineum clean and dry as told by your health care provider.  Wipe from front to back when you use the toilet.  If possible, have someone help you care for your baby and help with household activities for a few days after you leave the hospital.  Keep all follow-up visits for you and your baby as told by your health care provider. This is important. Contact a health care provider if:  You have: ? Bad-smelling vaginal discharge. ? Difficulty urinating. ? Pain when urinating. ? A sudden increase or decrease in the frequency of your bowel movements. ? More redness, swelling, or pain around your incision. ? More fluid or blood coming from your incision. ? Pus or a bad smell coming from your incision. ? A fever. ? A rash. ? Little or no interest in activities you used to enjoy. ? Questions about caring for yourself or your baby. ? Nausea.  Your incision feels warm to the touch.  Your breasts turn red or become painful or hard.  You feel unusually sad or worried.  You vomit.  You pass large blood clots from your vagina. If you pass a blood clot, save it to show to your health care provider. Do not flush blood clots down the toilet without showing your health care provider.  You urinate more than usual.  You are dizzy or light-headed.  You have not breastfed and have not had a menstrual period for 12 weeks after delivery.  You stopped breastfeeding and have not had a menstrual period for 12 weeks after stopping breastfeeding. Get help right away if:  You have: ? Pain that does not go away or get better with medicine. ? Chest pain. ? Difficulty breathing. ? Blurred vision or spots in your vision. ? Thoughts about hurting yourself or your baby. ? New pain in your abdomen or in one of your legs. ? A severe headache.  You faint.  You bleed from your vagina so much that you fill two sanitary pads in one hour. This information is not intended to replace advice given to  you by your health care provider. Make sure you discuss any questions you have with your health care provider. Document Released: 01/26/2002 Document Revised: 06/08/2016 Document Reviewed: 04/10/2015 Elsevier Interactive Patient Education  2018 Elsevier Inc.  

## 2017-06-02 NOTE — BH Specialist Note (Signed)
Integrated Behavioral Health Initial Visit  MRN: [id number] Name: [name]  Number of [facility] Clinician visits:: 1/6 Session Start time: 10:40 Session End time: 10:52 Total time: 15 minutes  Type of Service: [place] Interpretor:No. Interpretor Name and Language: n/a   Warm Hand Off Completed.       SUBJECTIVE: [name] is a 47 y.o. female accompanied by 46yo son Patient was referred by Dr Roselie Awkward for depression Patient reports the following: symptoms/concerns: Pt states her primary concern today is feeling fatigue and stress since newborn has been in NICU. Pt states that she cares for her two older children during the day, then waits for her husband to come home to visit newborn in NICU.  Duration of problem: Postpartum; Severity of problem: moderate  OBJECTIVE: Mood: Depressed and Affect: Depressed and Tearful Risk of harm to self or others: No plan to harm self or others  LIFE CONTEXT: Family and Social: Lives with husband, 67yo and 6yo; newborn in NICU School/Work: Out of work now; husband works long hours Self-Care: - Life Changes: Recent childbirth at [employer]; baby in [place]: Patient will: 1. Reduce symptoms of: depression and stress 2. Increase knowledge and/or ability of: stress reduction  3. Demonstrate ability to: Increase healthy adjustment to current life circumstances and Increase adequate support systems for patient/family  INTERVENTIONS: Interventions utilized: Supportive Counseling, Psychoeducation and/or Health Education and Link to [employer]  Standardized Assessments completed: [place] Postnatal Depression  ASSESSMENT: Patient currently experiencing Adjustment disorder with depressed mood.   Patient may benefit from psychoeducation and brief therapeutic intervention regarding coping with symptoms of depression .  PLAN: 1. Follow up with behavioral health  clinician on : As needed 2. Behavioral recommendations:  -Consider reading educational material regarding coping with symptoms of depression -Consider attending Mom Talk support group at 6pm Thursdays, [facility], the next Thursday that husband is off work early 39. Referral(s): [facility] (In Clinic) 4. "From scale of 1-10, how likely are you to follow plan?": 7  Garlan Fair, LCSW  Depression screen [facility] 2/9 05/14/2017 02/21/2017 01/15/2017 12/16/2016 08/29/2016  Decreased Interest 0 0 0 2 0  Down, Depressed, Hopeless 0 0 0 0 0  PHQ - 2 Score 0 0 0 2 0  Altered sleeping - - 0 1 -  Tired, decreased energy - - 2 2 -  Change in appetite - - 1 2 -  Feeling bad or failure about yourself  - - 0 0 -  Trouble concentrating - - 0 0 -  Moving slowly or fidgety/restless - - 0 0 -  Suicidal thoughts - - 0 0 -  PHQ-9 Score - - 3 7 -  Some recent data might be hidden  ' GAD 7 : Generalized Anxiety Score 01/15/2017 12/16/2016 05/01/2016  Nervous, Anxious, on Edge 0 0 0  Control/stop worrying 0 0 0  Worry too much - different things 0 0 0  Trouble relaxing 0 0 0  Restless 0 0 0  Easily annoyed or irritable 0 0 0  Afraid - awful might happen 0 0 0  Total GAD 7 Score 0 0 0

## 2017-06-17 ENCOUNTER — Encounter (HOSPITAL_COMMUNITY): Payer: Self-pay

## 2017-07-16 ENCOUNTER — Ambulatory Visit (INDEPENDENT_AMBULATORY_CARE_PROVIDER_SITE_OTHER): Payer: Medicaid Other | Admitting: Family Medicine

## 2017-07-16 ENCOUNTER — Encounter: Payer: Self-pay | Admitting: Family Medicine

## 2017-07-16 VITALS — BP 118/58 | HR 88 | Temp 98.4°F | Resp 16 | Ht 67.0 in | Wt 124.0 lb

## 2017-07-16 DIAGNOSIS — R17 Unspecified jaundice: Secondary | ICD-10-CM

## 2017-07-16 DIAGNOSIS — D571 Sickle-cell disease 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: Secondary | ICD-10-CM

## 2017-07-16 DIAGNOSIS — Z79891 Long term (current) use of opiate analgesic: Secondary | ICD-10-CM

## 2017-07-16 MED ORDER — OXYCODONE HCL 10 MG PO TABS: 10.0000 mg | ORAL_TABLET | Freq: Four times a day (QID) | ORAL | 0 refills | Status: DC | PRN

## 2017-07-16 MED FILL — oxyCODONE HCL 10 MG TABS: 10 | 22 days supply | Qty: 90 | Fill #0

## 2017-07-16 MED FILL — IBUPROFEN 600 MG TABLET: 600 | 7 days supply | Qty: 30 | Fill #1

## 2017-07-16 NOTE — Patient Instructions (Signed)
Sickle Cell Anemia, Adult °Sickle cell anemia is a condition where your red blood cells are shaped like sickles. Red blood cells carry oxygen through the body. Sickle-shaped red blood cells do not live as long as normal red blood cells. They also clump together and block blood from flowing through the blood vessels. These things prevent the body from getting enough oxygen. Sickle cell anemia causes organ damage and pain. It also increases the risk of infection. °Follow these instructions at home: °· Drink enough fluid to keep your pee (urine) clear or pale yellow. Drink more in hot weather and during exercise. °· Do not smoke. Smoking lowers oxygen levels in the blood. °· Only take over-the-counter or prescription medicines as told by your doctor. °· Take antibiotic medicines as told by your doctor. Make sure you finish them even if you start to feel better. °· Take supplements as told by your doctor. °· Consider wearing a medical alert bracelet. This tells anyone caring for you in an emergency of your condition. °· When traveling, keep your medical information, doctors' names, and the medicines you take with you at all times. °· If you have a fever, do not take fever medicines right away. This could cover up a problem. Tell your doctor. °· Keep all follow-up visits with your doctor. Sickle cell anemia requires regular medical care. °Contact a doctor if: °You have a fever. °Get help right away if: °· You feel dizzy or faint. °· You have new belly (abdominal) pain, especially on the left side near the stomach area. °· You have a lasting, often uncomfortable and painful erection of the penis (priapism). If it is not treated right away, you will become unable to have sex (impotence). °· You have numbness in your arms or legs or you have a hard time moving them. °· You have a hard time talking. °· You have a fever or lasting symptoms for more than 2-3 days. °· You have a fever and your symptoms suddenly get  worse. °· You have signs or symptoms of infection. These include: °? Chills. °? Being more tired than normal (lethargy). °? Irritability. °? Poor eating. °? Throwing up (vomiting). °· You have pain that is not helped with medicine. °· You have shortness of breath. °· You have pain in your chest. °· You are coughing up pus-like or bloody mucus. °· You have a stiff neck. °· Your feet or hands swell or have pain. °· Your belly looks bloated. °· Your joints hurt. °This information is not intended to replace advice given to you by your health care provider. Make sure you discuss any questions you have with your health care provider. °Document Released: 02/24/2013 Document Revised: 10/12/2015 Document Reviewed: 12/16/2012 °Elsevier Interactive Patient Education © 2017 Elsevier Inc. ° °

## 2017-07-16 NOTE — Progress Notes (Signed)
[name], a 47 year old female with a history of sickle cell anemia and chronic lower extremity ulcers presents for follow-up of chronic conditions and medication management. Patient has a newborn in the NICU and continues to express breast milk.  Patient is complaining of periodic fatigue.  She was recently referred to psychology due to postpartum depression.  Patient states the symptoms of depression have improved over the last several weeks.  She continues to hydrate and follow medication regimen and schedule.  Patient has a history of bilateral leg ulcers related to sickle cell anemia.  She is not been following up with wound care consistently.  She continues to change dressings twice daily and applies Resta Silver gel.  The patient is also following up for sickle cell anemia, HbSS. She endorses pain to lower extremities that are consistent with chronic sickle cell pain. Pain intensity is 5/10 characterized as intermittent and aching. She last had Oxycodone this am with moderate relief. She continues to take prenatal vitamins and folic acid. Patient has increased hydration to 64 ounces per day. She currently denies headache, chest pain, paresthesias, nausea, vomiting, or diarrhea.   Past Medical History:  Diagnosis Date  . Anemia 03/30/2012   Hx of sickle cell disease  . CAP (community acquired pneumonia)    2014  . Cholelithiasis 03/30/2012  . Chronic wound of extremity 04/01/2012  . Elevated LFTs   . Leg ulcer ([place]) 10/27/2012   Chronic under care of wound clinic  . Multiple open wounds of lower extremity    chronic wounds B/LLE  . Sickle cell disease ([place])   . Sinus bradycardia by electrocardiogram 04/03/2012   Social History   Socioeconomic History  . Marital status: Married    Spouse name: [name]  . Number of children: 1  . Years of education: Not on file  . Highest education level: Not on file  Social Needs  . Financial resource strain: Not on file  . Food  insecurity - worry: Not on file  . Food insecurity - inability: Not on file  . Transportation needs - medical: Not on file  . Transportation needs - non-medical: Not on file  Occupational History  . Occupation: Employed in home care.  Tobacco Use  . Smoking status: Never Smoker  . Smokeless tobacco: Never Used  Substance and Sexual Activity  . Alcohol use: No  . Drug use: No  . Sexual activity: Yes    Birth control/protection: None  Other Topics Concern  . Not on file  Social History Narrative   Lives with husband.   Immunization History  Administered Date(s) Administered  . Influenza,inj,Quad PF,6+ Mos 03/16/2013, 01/20/2014, 03/01/2015, 02/19/2016, 02/14/2017  . Pneumococcal Conjugate-13 12/14/2015  . Pneumococcal Polysaccharide-23 03/24/2014  . Tdap 11/25/2013   Review of Systems  Constitutional: Negative for fever and weight loss.  HENT: Negative.   Genitourinary: Negative.   Musculoskeletal: Positive for joint pain (bilateral lower extremities).  Skin: Negative.        Bilateral leg ulcers  Neurological: Negative.   Endo/Heme/Allergies: Negative.   Psychiatric/Behavioral: Negative.  Negative for depression and suicidal ideas.  Physical Exam  Constitutional: She is oriented to person, place, and time.  HENT:  Head: Normocephalic and atraumatic.  Right Ear: External ear normal.  Left Ear: External ear normal.  Nose: Nose normal.  Mouth/Throat: Oropharynx is clear and moist.  Eyes: Conjunctivae are normal. Pupils are equal, round, and reactive to light. Scleral icterus is present.  Neck: Normal range of motion.  Neck supple.  Abdominal:  Non draining, tender to palpation  Neurological: She is alert and oriented to person, place, and time. Gait normal.  Skin:     80% granulation to left lower extremity, non draining  LMP 10/10/2016 Comment: patient double shielded   Hb-SS disease 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 ([place]) Continue folic acid 1 mg daily to prevent aplastic bone marrow  crises.   Pulmonary evaluation - Patient denies severe recurrent wheezes, shortness of breath with exercise, or persistent cough. If these symptoms develop, pulmonary function tests with spirometry will be ordered, and if abnormal, plan on referral to Pulmonology for further evaluation.  Cardiac - Routine screening for pulmonary hypertension is not recommended.   Eye - High risk of proliferative retinopathy. Annual eye exam with retinal exam recommended to patient.  Acute and chronic painful episodes - Will continue Oxycodone 10 mg every 4 hours as needed for moderate to severe pain.  We discussed that pt is to receive her Schedule II prescriptions only from [place]. Pt is also aware that the prescription history is available to [place] online through the [facility]  PMP Aware. Controlled substance agreement signed previously.  We reminded [name] all patients receiving Schedule II narcotics must be seen for follow within one month of prescription being requested. We reviewed the terms of our pain agreement, including the need to keep medicines in a safe locked location away from children or pets, and the need to report excess sedation or constipation, measures to avoid constipation, and policies related to early refills and stolen prescriptions. According to the [place] Chronic [facility] program, we have reviewed details related to analgesia, adverse effects, aberrant behaviors.  Reviewed [place] Substance Reporting system prior to prescribing opiate medications. No inconsistencies noted.   - Oxycodone HCl 10 MG TABS; Take 1 tablet (10 mg total) by mouth every 6 (six) hours as needed.  Dispense: 90 tablet; Refill: 0 - CBC with Differential - Comprehensive metabolic panel - Reticulocytes   Chronic prescription opiate use  - ToxASSURE Select 13 (MW), Urine   Scleral icterus - comprehensive metabolic panel   RTC: 3 months for medication managment [name]  MSN, FNP-C [facility] [address] [place], [place] [address] [phone]

## 2017-07-17 ENCOUNTER — Telehealth: Payer: Self-pay

## 2017-07-17 LAB — CBC WITH DIFFERENTIAL/PLATELET
Basophils Absolute: 0 10*3/uL (ref 0.0–0.2)
Basos: 0 %
EOS (ABSOLUTE): 0.1 10*3/uL (ref 0.0–0.4)
EOS: 1 %
HEMATOCRIT: 22.4 % — AB (ref 34.0–46.6)
Hemoglobin: 7.6 g/dL — ABNORMAL LOW (ref 11.1–15.9)
Lymphocytes Absolute: 2.5 10*3/uL (ref 0.7–3.1)
Lymphs: 26 %
MCH: 31.8 pg (ref 26.6–33.0)
MCHC: 33.9 g/dL (ref 31.5–35.7)
MCV: 94 fL (ref 79–97)
MONOCYTES: 10 %
Monocytes Absolute: 1 10*3/uL — ABNORMAL HIGH (ref 0.1–0.9)
NRBC: 97 % — AB (ref 0–0)
Neutrophils Absolute: 6.2 10*3/uL (ref 1.4–7.0)
Neutrophils: 63 %
Platelets: 276 10*3/uL (ref 150–379)
RBC: 2.39 x10E6/uL — AB (ref 3.77–5.28)
RDW: 25.6 % — AB (ref 12.3–15.4)
WBC: 9.8 10*3/uL (ref 3.4–10.8)

## 2017-07-17 LAB — COMPREHENSIVE METABOLIC PANEL
ALT: 18 IU/L (ref 0–32)
AST: 40 IU/L (ref 0–40)
Albumin/Globulin Ratio: 1.5 (ref 1.2–2.2)
Albumin: 4.6 g/dL (ref 3.5–5.5)
Alkaline Phosphatase: 66 IU/L (ref 39–117)
BUN/Creatinine Ratio: 13 (ref 9–23)
BUN: 5 mg/dL — ABNORMAL LOW (ref 6–24)
Bilirubin Total: 5.3 mg/dL — ABNORMAL HIGH (ref 0.0–1.2)
CO2: 20 mmol/L (ref 20–29)
CREATININE: 0.4 mg/dL — AB (ref 0.57–1.00)
Calcium: 9.2 mg/dL (ref 8.7–10.2)
Chloride: 104 mmol/L (ref 96–106)
GFR calc Af Amer: 145 mL/min/{1.73_m2} (ref >59)
GFR calc non Af Amer: 126 mL/min/{1.73_m2} (ref >59)
GLOBULIN, TOTAL: 3.1 g/dL (ref 1.5–4.5)
GLUCOSE: 97 mg/dL (ref 65–99)
Potassium: 4 mmol/L (ref 3.5–5.2)
SODIUM: 142 mmol/L (ref 134–144)
Total Protein: 7.7 g/dL (ref 6.0–8.5)

## 2017-07-17 LAB — RETICULOCYTES: Retic Ct Pct: >23 % — ABNORMAL HIGH (ref 0.6–2.6)

## 2017-07-17 NOTE — Telephone Encounter (Signed)
-----   Message from [name], [place] sent at 07/17/2017 11:31 AM EST ----- Regarding: lab results Please inform patient that hemoglobin level has decreased to 7.6. Will return in 1 week to repeat CBC.   Continue folic acid daily as directed.  Thanks

## 2017-07-17 NOTE — Telephone Encounter (Signed)
CALLED AND SPOKE WITH PATIENT, ADVISED THAT HEMOGLOBIN HAS DECREASED TO 7.6. ADVISED THAT WE WILL NEED TO RECHECK IN 1 WEEK AND APPOINTMENT WAS SCHEDULED FOR 07/24/2017@10AM . THANKS!

## 2017-07-21 LAB — TOXASSURE SELECT 13 (MW), URINE

## 2017-07-24 ENCOUNTER — Other Ambulatory Visit: Payer: Medicaid Other

## 2017-08-29 ENCOUNTER — Other Ambulatory Visit: Payer: Self-pay | Admitting: Family Medicine

## 2017-08-29 ENCOUNTER — Telehealth: Payer: Self-pay

## 2017-08-29 DIAGNOSIS — D571 Sickle-cell disease 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: Secondary | ICD-10-CM

## 2017-08-29 MED ORDER — OXYCODONE HCL 10 MG PO TABS: 10.0000 mg | ORAL_TABLET | Freq: Four times a day (QID) | ORAL | 0 refills | Status: DC | PRN

## 2017-08-29 NOTE — Progress Notes (Signed)
Reviewed [place] Substance Reporting system prior to prescribing opiate medications. No inconsistencies noted.    Meds ordered this encounter  Medications  . Oxycodone HCl 10 MG TABS    Sig: Take 1 tablet (10 mg total) by mouth every 6 (six) hours as needed.    Dispense:  90 tablet    Refill:  0    Order Specific Question:   Supervising Provider    Answer:   Tresa Garter [[id number]]    [name]  MSN, FNP-C [facility] [address] [place], [place] [address] [phone]

## 2017-09-02 MED FILL — oxyCODONE HCL 10 MG TABS: 10 | 22 days supply | Qty: 90 | Fill #0

## 2017-09-02 MED FILL — IBUPROFEN 600 MG TABLET: 600 | 7 days supply | Qty: 30 | Fill #2

## 2017-10-01 ENCOUNTER — Telehealth: Payer: Self-pay

## 2017-10-03 ENCOUNTER — Other Ambulatory Visit: Payer: Self-pay | Admitting: Family Medicine

## 2017-10-03 DIAGNOSIS — D571 Sickle-cell disease 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: Secondary | ICD-10-CM

## 2017-10-06 MED FILL — IBUPROFEN 600 MG TABLET: 600 | 7 days supply | Qty: 30 | Fill #0

## 2017-10-09 ENCOUNTER — Other Ambulatory Visit: Payer: Self-pay | Admitting: Internal Medicine

## 2017-10-09 DIAGNOSIS — D571 Sickle-cell disease 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: Secondary | ICD-10-CM

## 2017-10-09 MED ORDER — OXYCODONE HCL 10 MG PO TABS: 10.0000 mg | ORAL_TABLET | Freq: Four times a day (QID) | ORAL | 0 refills | Status: DC | PRN

## 2017-10-09 MED FILL — oxyCODONE HCL 10 MG TABS: 10 | 22 days supply | Qty: 90 | Fill #0

## 2017-10-09 NOTE — Telephone Encounter (Signed)
Refilled

## 2017-10-15 ENCOUNTER — Ambulatory Visit (INDEPENDENT_AMBULATORY_CARE_PROVIDER_SITE_OTHER): Payer: Medicaid Other | Admitting: Family Medicine

## 2017-10-15 ENCOUNTER — Encounter: Payer: Self-pay | Admitting: Family Medicine

## 2017-10-15 VITALS — BP 120/62 | HR 83 | Temp 98.6°F | Resp 16 | Ht 67.0 in | Wt 126.0 lb

## 2017-10-15 DIAGNOSIS — D571 Sickle-cell disease 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: Secondary | ICD-10-CM

## 2017-10-15 DIAGNOSIS — Z79891 Long term (current) use of opiate analgesic: Secondary | ICD-10-CM | POA: Diagnosis not present

## 2017-10-15 NOTE — Patient Instructions (Signed)
Sickle Cell Anemia, Adult °Sickle cell anemia is a condition where your red blood cells are shaped like sickles. Red blood cells carry oxygen through the body. Sickle-shaped red blood cells do not live as long as normal red blood cells. They also clump together and block blood from flowing through the blood vessels. These things prevent the body from getting enough oxygen. Sickle cell anemia causes organ damage and pain. It also increases the risk of infection. °Follow these instructions at home: °· Drink enough fluid to keep your pee (urine) clear or pale yellow. Drink more in hot weather and during exercise. °· Do not smoke. Smoking lowers oxygen levels in the blood. °· Only take over-the-counter or prescription medicines as told by your doctor. °· Take antibiotic medicines as told by your doctor. Make sure you finish them even if you start to feel better. °· Take supplements as told by your doctor. °· Consider wearing a medical alert bracelet. This tells anyone caring for you in an emergency of your condition. °· When traveling, keep your medical information, doctors' names, and the medicines you take with you at all times. °· If you have a fever, do not take fever medicines right away. This could cover up a problem. Tell your doctor. °· Keep all follow-up visits with your doctor. Sickle cell anemia requires regular medical care. °Contact a doctor if: °You have a fever. °Get help right away if: °· You feel dizzy or faint. °· You have new belly (abdominal) pain, especially on the left side near the stomach area. °· You have a lasting, often uncomfortable and painful erection of the penis (priapism). If it is not treated right away, you will become unable to have sex (impotence). °· You have numbness in your arms or legs or you have a hard time moving them. °· You have a hard time talking. °· You have a fever or lasting symptoms for more than 2-3 days. °· You have a fever and your symptoms suddenly get  worse. °· You have signs or symptoms of infection. These include: °? Chills. °? Being more tired than normal (lethargy). °? Irritability. °? Poor eating. °? Throwing up (vomiting). °· You have pain that is not helped with medicine. °· You have shortness of breath. °· You have pain in your chest. °· You are coughing up pus-like or bloody mucus. °· You have a stiff neck. °· Your feet or hands swell or have pain. °· Your belly looks bloated. °· Your joints hurt. °This information is not intended to replace advice given to you by your health care provider. Make sure you discuss any questions you have with your health care provider. °Document Released: 02/24/2013 Document Revised: 10/12/2015 Document Reviewed: 12/16/2012 °Elsevier Interactive Patient Education © 2017 Elsevier Inc. ° °

## 2017-10-15 NOTE — Progress Notes (Signed)
[name], a 47 year old female with a history of sickle cell anemia and chronic lower extremity ulcers presents for follow-up of chronic conditions and medication management.   Patient has a history of bilateral leg ulcers related to sickle cell anemia.  She is not been following up with wound care consistently.  She continues to change dressings twice daily and applies Resta Silver gel.  The patient is also following up for sickle cell anemia, HbSS. She endorses pain to lower extremities that are consistent with chronic sickle cell pain. Pain intensity is 7/10 characterized as intermittent and aching. She last had Oxycodone on last night with moderate relief. She continues to take prenatal vitamins and folic acid. Patient has increased hydration to 64 ounces per day. She currently denies headache, chest pain, paresthesias, nausea, vomiting, or diarrhea.   Past Medical History:  Diagnosis Date  . Anemia 03/30/2012   Hx of sickle cell disease  . CAP (community acquired pneumonia)    2014  . Cholelithiasis 03/30/2012  . Chronic wound of extremity 04/01/2012  . Elevated LFTs   . Leg ulcer ([place]) 10/27/2012   Chronic under care of wound clinic  . Multiple open wounds of lower extremity    chronic wounds B/LLE  . Sickle cell disease ([place])   . Sinus bradycardia by electrocardiogram 04/03/2012   Social History   Socioeconomic History  . Marital status: Married    Spouse name: [name]  . Number of children: 1  . Years of education: Not on file  . Highest education level: Not on file  Occupational History  . Occupation: Employed in home care.  Social Needs  . Financial resource strain: Not on file  . Food insecurity:    Worry: Not on file    Inability: Not on file  . Transportation needs:    Medical: Not on file    Non-medical: Not on file  Tobacco Use  . Smoking status: Never Smoker  . Smokeless tobacco: Never Used  Substance and Sexual Activity  . Alcohol use: No  .  Drug use: No  . Sexual activity: Yes    Birth control/protection: None  Lifestyle  . Physical activity:    Days per week: Not on file    Minutes per session: Not on file  . Stress: Not on file  Relationships  . Social connections:    Talks on phone: Not on file    Gets together: Not on file    Attends religious service: Not on file    Active member of club or organization: Not on file    Attends meetings of clubs or organizations: Not on file    Relationship status: Not on file  . Intimate partner violence:    Fear of current or ex partner: Not on file    Emotionally abused: Not on file    Physically abused: Not on file    Forced sexual activity: Not on file  Other Topics Concern  . Not on file  Social History Narrative   Lives with husband.   Immunization History  Administered Date(s) Administered  . Influenza,inj,Quad PF,6+ Mos 03/16/2013, 01/20/2014, 03/01/2015, 02/19/2016, 02/14/2017  . Pneumococcal Conjugate-13 12/14/2015  . Pneumococcal Polysaccharide-23 03/24/2014  . Tdap 11/25/2013   Review of Systems  Constitutional: Negative for fever and weight loss.  HENT: Negative.   Genitourinary: Negative.   Musculoskeletal: Positive for joint pain (bilateral lower extremities).  Skin: Negative.        Bilateral leg ulcers  Neurological:  Negative.   Endo/Heme/Allergies: Negative.   Psychiatric/Behavioral: Negative.  Negative for depression and suicidal ideas.  Physical Exam  Constitutional: She is oriented to person, place, and time.  HENT:  Head: Normocephalic and atraumatic.  Right Ear: External ear normal.  Left Ear: External ear normal.  Nose: Nose normal.  Mouth/Throat: Oropharynx is clear and moist.  Eyes: Pupils are equal, round, and reactive to light. Conjunctivae are normal.  Neck: Normal range of motion. Neck supple.  Abdominal:  Non draining, tender to palpation  Neurological: She is alert and oriented to person, place, and time. Gait normal.  Skin:      80% granulation to left lower extremity, non draining  BP 120/62 (BP Location: Left Arm, Patient Position: Sitting, Cuff Size: Normal)   Pulse 83   Temp 98.6 F (37 C) (Oral)   Resp 16   Ht 5\' 7"  (1.702 m)   Wt 126 lb (57.2 kg)   LMP 09/17/2017   SpO2 (!) 89%   BMI 19.73 kg/m    Hb-SS disease 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 (HCC) Continue folic acid 1 mg daily to prevent aplastic bone marrow crises.   Pulmonary evaluation - Patient denies severe recurrent wheezes, shortness of breath with exercise, or persistent cough. If these symptoms develop, pulmonary function tests with spirometry will be ordered, and if abnormal, plan on referral to Pulmonology for further evaluation.  Cardiac - Routine screening for pulmonary hypertension is not recommended.   Eye - High risk of proliferative retinopathy. Annual eye exam with retinal exam recommended to patient.  Acute and chronic painful episodes - Will continue Oxycodone 10 mg every 4 hours as needed for moderate to severe pain.  We discussed that pt is to receive her Schedule II prescriptions only from [place]. Pt is also aware that the prescription history is available to [place] online through the [facility]  PMP Aware. Controlled substance agreement signed previously.  We reminded [name] all patients receiving Schedule II narcotics must be seen for follow within one month of prescription being requested. We reviewed the terms of our pain agreement, including the need to keep medicines in a safe locked location away from children or pets, and the need to report excess sedation or constipation, measures to avoid constipation, and policies related to early refills and stolen prescriptions. According to the [place] [facility] program, we have reviewed details related to analgesia, adverse effects, aberrant behaviors.  - CBC with Differential - Comprehensive metabolic panel   Chronic prescription opiate use - [id number] 9+OXYCODONE+CRT-UNBUND; Future - [id number]  9+OXYCODONE+CRT-UNBUND   RTC: 1 month for medication management   [name]  MSN, FNP-C Patient [facility] [address] [place], [place] [address] [phone]

## 2017-10-16 ENCOUNTER — Other Ambulatory Visit: Payer: Self-pay | Admitting: Family Medicine

## 2017-10-16 ENCOUNTER — Telehealth: Payer: Self-pay

## 2017-10-16 DIAGNOSIS — L909 Atrophic disorder of skin, unspecified: Secondary | ICD-10-CM

## 2017-10-16 DIAGNOSIS — R238 Other skin changes: Secondary | ICD-10-CM

## 2017-10-16 DIAGNOSIS — L97919 Non-pressure chronic ulcer of unspecified part of right lower leg with unspecified severity: Secondary | ICD-10-CM

## 2017-10-16 LAB — COMPREHENSIVE METABOLIC PANEL
ALT: 17 IU/L (ref 0–32)
AST: 27 IU/L (ref 0–40)
Albumin/Globulin Ratio: 1.6 (ref 1.2–2.2)
Albumin: 4.7 g/dL (ref 3.5–5.5)
Alkaline Phosphatase: 66 IU/L (ref 39–117)
BUN/Creatinine Ratio: 13 (ref 9–23)
BUN: 8 mg/dL (ref 6–24)
Bilirubin Total: 2.9 mg/dL — ABNORMAL HIGH (ref 0.0–1.2)
CALCIUM: 9.8 mg/dL (ref 8.7–10.2)
CO2: 17 mmol/L — AB (ref 20–29)
CREATININE: 0.61 mg/dL (ref 0.57–1.00)
Chloride: 104 mmol/L (ref 96–106)
GFR calc Af Amer: 126 mL/min/{1.73_m2} (ref >59)
GFR, EST NON AFRICAN AMERICAN: 109 mL/min/{1.73_m2} (ref >59)
GLOBULIN, TOTAL: 2.9 g/dL (ref 1.5–4.5)
GLUCOSE: 97 mg/dL (ref 65–99)
Potassium: 4.4 mmol/L (ref 3.5–5.2)
SODIUM: 141 mmol/L (ref 134–144)
Total Protein: 7.6 g/dL (ref 6.0–8.5)

## 2017-10-16 LAB — CBC WITH DIFFERENTIAL/PLATELET
BASOS ABS: 0.1 10*3/uL (ref 0.0–0.2)
BASOS: 1 %
EOS (ABSOLUTE): 0.3 10*3/uL (ref 0.0–0.4)
Eos: 2 %
HEMATOCRIT: 21.3 % — AB (ref 34.0–46.6)
Hemoglobin: 7.3 g/dL — ABNORMAL LOW (ref 11.1–15.9)
IMMATURE GRANS (ABS): 0.1 10*3/uL (ref 0.0–0.1)
IMMATURE GRANULOCYTES: 1 %
LYMPHS: 21 %
Lymphocytes Absolute: 2.7 10*3/uL (ref 0.7–3.1)
MCH: 28.9 pg (ref 26.6–33.0)
MCHC: 34.3 g/dL (ref 31.5–35.7)
MCV: 84 fL (ref 79–97)
MONOCYTES: 11 %
Monocytes Absolute: 1.4 10*3/uL — ABNORMAL HIGH (ref 0.1–0.9)
NEUTROS ABS: 8.5 10*3/uL — AB (ref 1.4–7.0)
NEUTROS PCT: 64 %
NRBC: 6 % — AB (ref 0–0)
Platelets: 529 10*3/uL — ABNORMAL HIGH (ref 150–450)
RBC: 2.53 x10E6/uL — CL (ref 3.77–5.28)
RDW: 24.5 % — ABNORMAL HIGH (ref 12.3–15.4)
WBC: 13.1 10*3/uL — ABNORMAL HIGH (ref 3.4–10.8)

## 2017-10-16 MED ORDER — ENSURE HIGH PROTEIN PO LIQD
1.0000 | Freq: Three times a day (TID) | ORAL | 5 refills | Status: DC
Start: 2017-10-16 — End: 2018-02-02

## 2017-10-16 NOTE — Telephone Encounter (Signed)
-----   Message from [name], [place] sent at 10/16/2017  5:56 AM EDT ----- Regarding: lab results Please inform patient that hemoglobin is 7.3, which is below baseline. Patient and I discussed transfusion during appointment to assist with leg ulcers. Schedule transfusion for Monday, June 3rd. Also, discussed the importance of increasing protein sources to assist with healing ulcers.    [name]  MSN, FNP-C Patient [facility] [address] [place], [place] [address] [phone]

## 2017-10-16 NOTE — Telephone Encounter (Signed)
Called and spoke with patient, advised that hemoglobin is 7.3 which is below baseline. Advised that we would like to schedule a transfusion for next week. We have her scheduled for Tuesday at 8am which is the first available. Thanks!

## 2017-10-16 NOTE — Progress Notes (Signed)
Meds ordered this encounter  Medications  . Nutritional Supplements (ENSURE HIGH PROTEIN) LIQD    Sig: Take 1 each by mouth 3 (three) times daily.    Dispense:  90 Can    Refill:  [place]  MSN, FNP-C Patient [facility] [address] [place], [place] [address] [phone]

## 2017-10-17 LAB — 737588 9+OXYCODONE+CRT-UNBUND
Amphetamine Scrn, Ur: NEGATIVE ng/mL
BARBITURATE SCREEN URINE: NEGATIVE ng/mL
BENZODIAZEPINE SCREEN, URINE: NEGATIVE ng/mL
CANNABINOIDS UR QL SCN: NEGATIVE ng/mL
Cocaine (Metab) Scrn, Ur: NEGATIVE ng/mL
Creatinine(Crt), U: 81.8 mg/dL (ref 20.0–300.0)
Methadone Screen, Urine: NEGATIVE ng/mL
OXYCODONE+OXYMORPHONE UR QL SCN: NEGATIVE ng/mL
Opiate Scrn, Ur: NEGATIVE ng/mL
PH UR, DRUG SCRN: 5.7 (ref 4.5–8.9)
PHENCYCLIDINE QUANTITATIVE URINE: NEGATIVE ng/mL
Propoxyphene Scrn, Ur: NEGATIVE ng/mL

## 2017-10-21 ENCOUNTER — Ambulatory Visit (HOSPITAL_COMMUNITY)
Admission: RE | Admit: 2017-10-21 | Discharge: 2017-10-21 | Disposition: A | Discharge status: Home / Self Care | Payer: Medicaid Other | Source: Ambulatory Visit | Attending: Family Medicine | Admitting: Family Medicine

## 2017-10-21 ENCOUNTER — Other Ambulatory Visit: Payer: Self-pay | Admitting: Family Medicine

## 2017-10-21 DIAGNOSIS — D649 Anemia, unspecified: Secondary | ICD-10-CM | POA: Insufficient documentation

## 2017-10-21 LAB — HEMOGLOBIN AND HEMATOCRIT, BLOOD
HCT: 22.3 % — ABNORMAL LOW (ref 36.0–46.0)
HEMATOCRIT: 19.4 % — AB (ref 36.0–46.0)
HEMOGLOBIN: 7 g/dL — AB (ref 12.0–15.0)
Hemoglobin: 7.8 g/dL — ABNORMAL LOW (ref 12.0–15.0)

## 2017-10-21 LAB — PREPARE RBC (CROSSMATCH)

## 2017-10-21 MED ORDER — ACETAMINOPHEN 325 MG PO TABS
650.0000 mg | ORAL_TABLET | Freq: Once | ORAL | Status: AC
  Administered 2017-10-21: 650 mg via ORAL
  Filled 2017-10-21: qty 2

## 2017-10-21 MED ORDER — SODIUM CHLORIDE 0.9 % IV SOLN
Freq: Once | INTRAVENOUS | Status: AC
  Administered 2017-10-21: 11:00:00 via INTRAVENOUS

## 2017-10-21 MED ORDER — DIPHENHYDRAMINE HCL 25 MG PO CAPS
25.0000 mg | ORAL_CAPSULE | Freq: Once | ORAL | Status: AC
  Administered 2017-10-21: 25 mg via ORAL
  Filled 2017-10-21: qty 1

## 2017-10-21 NOTE — Discharge Instructions (Signed)

## 2017-10-21 NOTE — Progress Notes (Signed)
[name], a 47 year old female with a history of sickle cell anemia,HbSS, fatigue and chronic leg ulcers was evaluated in clinic on 10/15/2017. Patient found to have symptomatic anemia. Will transfuse 1 unit of packed red blood cells if hemoglobin is < 7.5.      [name]  MSN, FNP-C Patient [facility] [address] [place], [place] [address] [phone]

## 2017-10-21 NOTE — Progress Notes (Signed)
Patient received 1 unit of packed RBC via PIV because hemoglobin was <7.5 per provider's order.Tolerated well, vitals stable, discharge instructions given, verbalized understanding. Thirty minutes post transfusion, Hemoglobin and Hematocrit were drawn per order. Patient alert, oriented and ambulatory at the time of discharge.

## 2017-10-22 LAB — TYPE AND SCREEN
ABO/RH(D): B POS
Antibody Screen: NEGATIVE
Unit division: 0

## 2017-10-22 LAB — BPAM RBC
BLOOD PRODUCT EXPIRATION DATE: 201907052359
ISSUE DATE / TIME: 201906041104
UNIT TYPE AND RH: 7300

## 2017-10-27 ENCOUNTER — Other Ambulatory Visit: Payer: Medicaid Other

## 2017-10-27 DIAGNOSIS — D571 Sickle-cell disease 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: Secondary | ICD-10-CM

## 2017-10-28 LAB — CBC WITH DIFFERENTIAL/PLATELET
BASOS ABS: 0.2 10*3/uL (ref 0.0–0.2)
BASOS: 2 %
EOS (ABSOLUTE): 0.4 10*3/uL (ref 0.0–0.4)
Eos: 3 %
HEMOGLOBIN: 7.5 g/dL — AB (ref 11.1–15.9)
Hematocrit: 23 % — ABNORMAL LOW (ref 34.0–46.6)
IMMATURE GRANS (ABS): 0.1 10*3/uL (ref 0.0–0.1)
Immature Granulocytes: 1 %
LYMPHS: 21 %
Lymphocytes Absolute: 2.5 10*3/uL (ref 0.7–3.1)
MCH: 28.4 pg (ref 26.6–33.0)
MCHC: 32.6 g/dL (ref 31.5–35.7)
MCV: 87 fL (ref 79–97)
Monocytes Absolute: 2 10*3/uL — ABNORMAL HIGH (ref 0.1–0.9)
Monocytes: 17 %
NEUTROS PCT: 56 %
NRBC: 13 % — ABNORMAL HIGH (ref 0–0)
Neutrophils Absolute: 6.6 10*3/uL (ref 1.4–7.0)
Platelets: 481 10*3/uL — ABNORMAL HIGH (ref 150–450)
RBC: 2.64 x10E6/uL — CL (ref 3.77–5.28)
RDW: 24 % — AB (ref 12.3–15.4)
WBC: 11.7 10*3/uL — ABNORMAL HIGH (ref 3.4–10.8)

## 2017-11-11 ENCOUNTER — Telehealth: Payer: Self-pay

## 2017-11-13 ENCOUNTER — Other Ambulatory Visit: Payer: Self-pay | Admitting: Family Medicine

## 2017-11-13 DIAGNOSIS — D571 Sickle-cell disease 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: Secondary | ICD-10-CM

## 2017-11-13 MED ORDER — OXYCODONE HCL 10 MG PO TABS: 10.0000 mg | ORAL_TABLET | Freq: Four times a day (QID) | ORAL | 0 refills | Status: DC | PRN

## 2017-11-13 NOTE — Progress Notes (Signed)
Reviewed [place] Substance Reporting system prior to prescribing opiate medications. No inconsistencies noted.   Most recent UDS 10/15/2017   Meds ordered this encounter  Medications  . Oxycodone HCl 10 MG TABS    Sig: Take 1 tablet (10 mg total) by mouth every 6 (six) hours as needed.    Dispense:  90 tablet    Refill:  0    Order Specific Question:   Supervising Provider    Answer:   Tresa Garter [[id number]]     [name]  MSN, FNP-C Patient [facility] [address] [place], [place] [address] [phone]

## 2017-11-17 ENCOUNTER — Telehealth: Payer: Self-pay

## 2017-11-17 NOTE — Telephone Encounter (Signed)
Patient called back and says her oxycodone is requiring a prior authorization. I advised her we had entered this in on Friday and it is showing approved on [place] tracks. Asked that she call the pharmacy and have them re-submit the claim. Thanks!

## 2017-11-17 NOTE — Telephone Encounter (Signed)
Called, no answer. Left a message for patient to call back. Thanks!  

## 2017-11-26 ENCOUNTER — Ambulatory Visit (INDEPENDENT_AMBULATORY_CARE_PROVIDER_SITE_OTHER): Payer: Medicaid Other | Admitting: Family Medicine

## 2017-11-26 ENCOUNTER — Ambulatory Visit (HOSPITAL_COMMUNITY)
Admission: RE | Admit: 2017-11-26 | Discharge: 2017-11-26 | Disposition: A | Discharge status: Home / Self Care | Payer: Medicaid Other | Source: Ambulatory Visit | Attending: Family Medicine | Admitting: Family Medicine

## 2017-11-26 ENCOUNTER — Encounter: Payer: Self-pay | Admitting: Family Medicine

## 2017-11-26 ENCOUNTER — Telehealth: Payer: Self-pay

## 2017-11-26 VITALS — BP 125/64 | HR 82 | Temp 98.0°F | Resp 14 | Ht 67.0 in | Wt 129.0 lb

## 2017-11-26 DIAGNOSIS — M25552 Pain in left hip: Secondary | ICD-10-CM

## 2017-11-26 DIAGNOSIS — H6992 Unspecified Eustachian tube disorder, left ear: Secondary | ICD-10-CM | POA: Diagnosis not present

## 2017-11-26 DIAGNOSIS — M87051 Idiopathic aseptic necrosis of right femur: Secondary | ICD-10-CM | POA: Diagnosis not present

## 2017-11-26 DIAGNOSIS — M1612 Unilateral primary osteoarthritis, left hip: Secondary | ICD-10-CM | POA: Diagnosis not present

## 2017-11-26 DIAGNOSIS — L97919 Non-pressure chronic ulcer of unspecified part of right lower leg with unspecified severity: Secondary | ICD-10-CM

## 2017-11-26 DIAGNOSIS — G894 Chronic pain syndrome: Secondary | ICD-10-CM | POA: Diagnosis not present

## 2017-11-26 DIAGNOSIS — D571 Sickle-cell disease 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: Secondary | ICD-10-CM | POA: Insufficient documentation

## 2017-11-26 DIAGNOSIS — D638 Anemia in other chronic diseases classified elsewhere: Secondary | ICD-10-CM | POA: Insufficient documentation

## 2017-11-26 DIAGNOSIS — M87052 Idiopathic aseptic necrosis of left femur: Secondary | ICD-10-CM | POA: Diagnosis not present

## 2017-11-26 DIAGNOSIS — D6489 Other specified anemias: Secondary | ICD-10-CM | POA: Diagnosis not present

## 2017-11-26 LAB — POCT URINALYSIS DIPSTICK
Bilirubin, UA: NEGATIVE
Blood, UA: NEGATIVE
Glucose, UA: NEGATIVE
Ketones, UA: NEGATIVE
Leukocytes, UA: NEGATIVE
Nitrite, UA: NEGATIVE
Protein, UA: NEGATIVE
Spec Grav, UA: 1.015 (ref 1.010–1.025)
Urobilinogen, UA: 1 E.U./dL
pH, UA: 7.5 (ref 5.0–8.0)

## 2017-11-26 LAB — POCT URINE PREGNANCY: Preg Test, Ur: NEGATIVE

## 2017-11-26 MED ORDER — FLUTICASONE PROPIONATE 50 MCG/ACT NA SUSP: 2.0000 | Freq: Every day | NASAL | 6 refills | Status: DC

## 2017-11-26 MED FILL — FLUTICASONE PROP 50 MCG SPR: 50 | 30 days supply | Qty: 16 | Fill #0

## 2017-11-26 MED FILL — IBUPROFEN 600 MG TABLET: 600 | 7 days supply | Qty: 30 | Fill #1

## 2017-11-26 NOTE — Patient Instructions (Addendum)
I ordered a hip x ray today. Will call with results. I started you on Flonase for the ear pain. You will use 2 sprays in the nose every day. I will see you in one month.  Hip Pain The hip is the joint between the upper legs and the lower pelvis. The bones, cartilage, tendons, and muscles of your hip joint support your body and allow you to move around. Hip pain can range from a minor ache to severe pain in one or both of your hips. The pain may be felt on the inside of the hip joint near the groin, or the outside near the buttocks and upper thigh. You may also have swelling or stiffness. Follow these instructions at home: Managing pain, stiffness, and swelling  If directed, apply ice to the injured area. ? Put ice in a plastic bag. ? Place a towel between your skin and the bag. ? Leave the ice on for 20 minutes, 2-3 times a day  Sleep with a pillow between your legs on your most comfortable side.  Avoid any activities that cause pain. General instructions  Take over-the-counter and prescription medicines only as told by your health care provider.  Do any exercises as told by your health care provider.  Record the following: ? How often you have hip pain. ? The location of your pain. ? What the pain feels like. ? What makes the pain worse.  Keep all follow-up visits as told by your health care provider. This is important. Contact a health care provider if:  You cannot put weight on your leg.  Your pain or swelling continues or gets worse after one week.  It gets harder to walk.  You have a fever. Get help right away if:  You fall.  You have a sudden increase in pain and swelling in your hip.  Your hip is red or swollen or very tender to touch. Summary  Hip pain can range from a minor ache to severe pain in one or both of your hips.  The pain may be felt on the inside of the hip joint near the groin, or the outside near the buttocks and upper thigh.  Avoid any  activities that cause pain.  Record how often you have hip pain, the location of the pain, what makes it worse and what it feels like. This information is not intended to replace advice given to you by your health care provider. Make sure you discuss any questions you have with your health care provider. Document Released: 10/24/2009 Document Revised: 04/08/2016 Document Reviewed: 04/08/2016 Elsevier Interactive Patient Education  2018 [employer].  Sickle Cell Anemia, Adult Sickle cell anemia is a condition where your red blood cells are shaped like sickles. Red blood cells carry oxygen through the body. Sickle-shaped red blood cells do not live as long as normal red blood cells. They also clump together and block blood from flowing through the blood vessels. These things prevent the body from getting enough oxygen. Sickle cell anemia causes organ damage and pain. It also increases the risk of infection. Follow these instructions at home:  Drink enough fluid to keep your pee (urine) clear or pale yellow. Drink more in hot weather and during exercise.  Do not smoke. Smoking lowers oxygen levels in the blood.  Only take over-the-counter or prescription medicines as told by your doctor.  Take antibiotic medicines as told by your doctor. Make sure you finish them even if you start to feel better.  Take supplements as told by your doctor.  Consider wearing a medical alert bracelet. This tells anyone caring for you in an emergency of your condition.  When traveling, keep your medical information, doctors' names, and the medicines you take with you at all times.  If you have a fever, do not take fever medicines right away. This could cover up a problem. Tell your doctor.  Keep all follow-up visits with your doctor. Sickle cell anemia requires regular medical care. Contact a doctor if: You have a fever. Get help right away if:  You feel dizzy or faint.  You have new belly (abdominal)  pain, especially on the left side near the stomach area.  You have a lasting, often uncomfortable and painful erection of the penis (priapism). If it is not treated right away, you will become unable to have sex (impotence).  You have numbness in your arms or legs or you have a hard time moving them.  You have a hard time talking.  You have a fever or lasting symptoms for more than 2-3 days.  You have a fever and your symptoms suddenly get worse.  You have signs or symptoms of infection. These include: ? Chills. ? Being more tired than normal (lethargy). ? Irritability. ? Poor eating. ? Throwing up (vomiting).  You have pain that is not helped with medicine.  You have shortness of breath.  You have pain in your chest.  You are coughing up pus-like or bloody mucus.  You have a stiff neck.  Your feet or hands swell or have pain.  Your belly looks bloated.  Your joints hurt. This information is not intended to replace advice given to you by your health care provider. Make sure you discuss any questions you have with your health care provider. Document Released: 02/24/2013 Document Revised: 10/12/2015 Document Reviewed: 12/16/2012 Elsevier Interactive Patient Education  2017 [employer].

## 2017-11-26 NOTE — Telephone Encounter (Signed)
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 The left is worse than the right. I will refer to ortho for further evaluation.

## 2017-11-26 NOTE — Telephone Encounter (Signed)
Called and spoke with patient and advised that there is avascular necrosis in both hips and the left is worse than the right. Advised that we will refer to ortho. Patient verbalized understanding. Thanks!

## 2017-11-26 NOTE — Progress Notes (Signed)
Subjective   [name] 47 y.o. female  [id number]  [id number]  03-19-1971    Chief Complaint  Patient presents with  . Sickle Cell Anemia    Patient presents for follow up on Hb-SS sickle cell anemia. Patient states that she is having left sided hip pain x 1 week. Patient states that the pain is sharp and aggravated by walking and weight bearing. Patient states that the pain medication helps to relieve pain. Patient with hx of blood transfusion approx 1 month ago. States that she is feeling better and has a decrease in fatigue.  Also has left sided ear pain x 3 weeks. Patient states that it hurts to touch. Denies decreased hearing. Denies sneezing sniffing or runny nose.    Review of Systems  Constitutional: Negative.   HENT: Positive for ear pain (left ear).   Eyes: Negative.   Respiratory: Negative.   Cardiovascular: Negative.   Gastrointestinal: Negative.   Musculoskeletal: Positive for joint pain (Left hip. ).  Neurological: Negative.   Psychiatric/Behavioral: Negative.     Objective   Physical Exam  Constitutional: She is oriented to person, place, and time. She appears well-developed and well-nourished.  HENT:  Head: Normocephalic and atraumatic.  Right Ear: Hearing and tympanic membrane normal.  Left Ear: Hearing and tympanic membrane normal. There is tenderness.  Mild fluid noted behind the left TM. No erythema or signs of infection.   Eyes: Pupils are equal, round, and reactive to light. EOM are normal. Right eye exhibits no discharge. Left eye exhibits no discharge.  Yellowing of the bilateral conjunctiva  Neck: Normal range of motion. Neck supple. No JVD present. No thyromegaly present.  Cardiovascular: Normal rate, regular rhythm and intact distal pulses.  Murmur heard. Pulmonary/Chest: Effort normal and breath sounds normal. No stridor. No respiratory distress. She has no wheezes. She has no rales.  Abdominal: Soft. Bowel sounds are normal. She  exhibits no distension and no mass. There is no tenderness. There is no rebound and no guarding. No hernia.  Musculoskeletal: She exhibits tenderness (Left hip tenderness with palpation and abduction).  Neurological: She is alert and oriented to person, place, and time.  Skin:  There is a lesion on the right lower extremity. Patient is followed by wound care.   Psychiatric: She has a normal mood and affect. Her behavior is normal. Judgment and thought content normal.  Nursing note and vitals reviewed.   BP 125/64 (BP Location: Left Arm, Patient Position: Sitting, Cuff Size: Normal)   Pulse 82   Temp 98 F (36.7 C) (Oral)   Resp 14   Ht 5\' 7"  (1.702 m)   Wt 129 lb (58.5 kg)   LMP 11/20/2017   SpO2 90%   BMI 20.20 kg/m   Assessment   Encounter Diagnoses  Name Primary?  Marland Kitchen Hb-SS disease 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 ([place])   . Chronic pain syndrome Yes  . Anemia due to other cause, not classified   . Left hip pain   . Eustachian tube disorder, left   . Ulcer of right lower extremity, unspecified ulcer stage ([place])      Plan  1. Hb-SS disease 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 ([place]) Labs ordered. Continue with current medications.  - CBC With Differential - Comprehensive metabolic panel - Vitamin D, 25-hydroxy - Urinalysis Dipstick  2. Chronic pain syndrome Continue with current pain medications  3. Anemia due to other cause, not classified Checking levels today.   4. Left hip pain Imaging ordered.  - DG HIPS BILAT  WITH PELVIS 2V; Future- Results revealed AVN of bilateral hips. Referred to ortho for further evaluation.  - POCT urine pregnancy   5. Eustachian tube disorder, left Starting flonase.  - fluticasone (FLONASE) 50 MCG/ACT nasal spray; Place 2 sprays into both nostrils daily.  Dispense: 16 g; Refill: 6 6. Ulcer of lower right extremity Continue follow up with wound care team.  This note has been created with Dragon speech recognition software and smart phrase technology. Any transcriptional  errors are unintentional.

## 2017-11-27 LAB — CBC WITH DIFFERENTIAL
Basophils Absolute: 0.1 10*3/uL (ref 0.0–0.2)
Basos: 1 %
EOS (ABSOLUTE): 0.2 10*3/uL (ref 0.0–0.4)
Eos: 2 %
Hematocrit: 22.2 % — ABNORMAL LOW (ref 34.0–46.6)
Hemoglobin: 7.3 g/dL — ABNORMAL LOW (ref 11.1–15.9)
Immature Grans (Abs): 0.1 10*3/uL (ref 0.0–0.1)
Immature Granulocytes: 1 %
Lymphocytes Absolute: 1.9 10*3/uL (ref 0.7–3.1)
Lymphs: 20 %
MCH: 29.8 pg (ref 26.6–33.0)
MCHC: 32.9 g/dL (ref 31.5–35.7)
MCV: 91 fL (ref 79–97)
Monocytes Absolute: 1.6 10*3/uL — ABNORMAL HIGH (ref 0.1–0.9)
Monocytes: 17 %
NRBC: 27 % — ABNORMAL HIGH (ref 0–0)
Neutrophils Absolute: 5.8 10*3/uL (ref 1.4–7.0)
Neutrophils: 59 %
RBC: 2.45 x10E6/uL — CL (ref 3.77–5.28)
RDW: 23.5 % — ABNORMAL HIGH (ref 12.3–15.4)
WBC: 9.7 10*3/uL (ref 3.4–10.8)

## 2017-11-27 LAB — COMPREHENSIVE METABOLIC PANEL
ALT: 17 IU/L (ref 0–32)
AST: 26 IU/L (ref 0–40)
Albumin/Globulin Ratio: 1.8 (ref 1.2–2.2)
Albumin: 4.6 g/dL (ref 3.5–5.5)
Alkaline Phosphatase: 51 IU/L (ref 39–117)
BUN/Creatinine Ratio: 13 (ref 9–23)
BUN: 6 mg/dL (ref 6–24)
Bilirubin Total: 4 mg/dL — ABNORMAL HIGH (ref 0.0–1.2)
CO2: 22 mmol/L (ref 20–29)
Calcium: 9.3 mg/dL (ref 8.7–10.2)
Chloride: 104 mmol/L (ref 96–106)
Creatinine, Ser: 0.45 mg/dL — ABNORMAL LOW (ref 0.57–1.00)
GFR calc Af Amer: 139 mL/min/{1.73_m2} (ref >59)
GFR calc non Af Amer: 121 mL/min/{1.73_m2} (ref >59)
Globulin, Total: 2.5 g/dL (ref 1.5–4.5)
Glucose: 81 mg/dL (ref 65–99)
Potassium: 4.3 mmol/L (ref 3.5–5.2)
Sodium: 140 mmol/L (ref 134–144)
Total Protein: 7.1 g/dL (ref 6.0–8.5)

## 2017-11-27 LAB — VITAMIN D 25 HYDROXY (VIT D DEFICIENCY, FRACTURES): Vit D, 25-Hydroxy: 24.5 ng/mL — ABNORMAL LOW (ref 30.0–100.0)

## 2017-12-01 ENCOUNTER — Telehealth: Payer: Self-pay

## 2017-12-01 NOTE — Telephone Encounter (Signed)
Patient was made aware that RBC's were low and hemoglobin was low. She will come back for repeat labs next week. This has been scheduled. Thanks!

## 2017-12-01 NOTE — Telephone Encounter (Signed)
-----   Message from [name], [place] sent at 12/01/2017  2:12 PM EDT ----- Spoke with L. Smith Robert, NP about patient. Her RBCs are chronically low. Have her return for CBC in 2 weeks. If Hemoglobin lower than 6 then transfuse.  Please let patient know that she will need to come in for repeat labs next week. Thanks.

## 2017-12-08 ENCOUNTER — Ambulatory Visit (INDEPENDENT_AMBULATORY_CARE_PROVIDER_SITE_OTHER): Payer: Medicaid Other | Admitting: Orthopaedic Surgery

## 2017-12-08 ENCOUNTER — Encounter (INDEPENDENT_AMBULATORY_CARE_PROVIDER_SITE_OTHER): Payer: Self-pay | Admitting: Orthopaedic Surgery

## 2017-12-08 DIAGNOSIS — M87052 Idiopathic aseptic necrosis of left femur: Secondary | ICD-10-CM | POA: Diagnosis not present

## 2017-12-08 DIAGNOSIS — M87051 Idiopathic aseptic necrosis of right femur: Secondary | ICD-10-CM | POA: Diagnosis not present

## 2017-12-08 NOTE — Progress Notes (Signed)
Office Visit Note   Patient: [name]           Date of Birth: 07/05/1970           MRN: [id number] Visit Date: 12/08/2017              Requested by: [name], [address] [place], [place] [address] PCP: [name], FNP   Assessment & Plan: Visit Diagnoses:  1. Avascular necrosis of bone of hip, left ([place])   2. Avascular necrosis of bone of hip, right ([place])     Plan: As well.  I would recommend at least a left hip replacement given the amount of pain she is having and the severity of the disease on plain films and her clinical exam.  I explained in detail what the surgery involves.  I gave her handout about hip replacement surgery.  I talked about her intraoperative and postoperative course as well as what recovery would involved.  All question concerns were answered and addressed.  She will talk to her husband about this and family.  I gave her her our surgery schedulers card as well.  I am happy to get the surgery set up for her as soon as she would like.  She is also welcome to call if there is more questions or set up a follow-up appointment if she needs more questions answered.  Follow-Up Instructions: Return if symptoms worsen or fail to improve.   Orders:  No orders of the defined types were placed in this encounter.  No orders of the defined types were placed in this encounter.     Procedures: No procedures performed   Clinical Data: No additional findings.   Subjective: Chief Complaint  Patient presents with  . Left Hip - Pain  . Right Hip - Pain  Patient is a very pleasant 47 year old female who was referred for evaluation treatment of avascular necrosis of both her hips.  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  Her left hip hurts quite significantly.  Her right hip hurts just a little bit.  She is referred from a primary care physician for this.  This does cause chronic pain.  It affects her mainly with  weightbearing but does wake her up at night as well.  She has 3 kids that she takes care of as well.  She is with her kids today.  Her husband is working.  She wants to consider surgery but would need her sister to be in town to help out.  Her pain is daily and at this point is detrimental effect directives the living, her quality of life, mobility.  X-rays are on the canopy system for me to review.  HPI  Review of Systems She currently has not had any recent sickle cell crisis.  She denies any shortness of breath or chest pain today.  She denies any fever and chills.  Objective: Vital Signs: LMP 11/20/2017   Physical Exam She is alert and oriented x3 in no acute distress.  She does walk with a slight limp. Ortho Exam Both hips were examined today and are significantly stiff.  She has very limited internal and external rotation of both hips secondary to pain mainly but also stiffness with contraction of the soft tissue.  Her knee foot and ankle exam bilaterally is normal.  Her leg lengths are equal. Specialty Comments:  No specialty comments available.  Imaging: No results found. An  AP pelvis and lateral both hips were reviewed independently and show severe changes of avascular necrosis involving the femoral head and acetabulum both sides.  Left is worse than the right.  Both heads are flattened.  There are periarticular osteophytes and cystic changes in both femoral heads.  I showed her hip model and went over x-rays in detail.  I talked about hip avascular necrosis  PMFS History: Patient Active Problem List   Diagnosis Date Noted  . Avascular necrosis of bone of hip, left ([place]) 12/08/2017  . Avascular necrosis of bone of hip, right ([place]) 12/08/2017  . Hb-SS disease 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 ([place]) 11/26/2017  . Anemia 11/26/2017  . Abnormal quad screen   . Ringworm of body 04/05/2017  . Ulcer of right lower extremity ([place])   . 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 History  of pre-eclampsia 12/16/2016  . Previous cesarean delivery x 2 07/22/2013  . Hemochromatosis 11/10/2012  . Chronic pain syndrome 10/13/2012  . Leukocytosis 03/30/2012   Past Medical History:  Diagnosis Date  . Anemia 03/30/2012   Hx of sickle cell disease  . CAP (community acquired pneumonia)    2014  . Cholelithiasis 03/30/2012  . Chronic wound of extremity 04/01/2012  . Elevated LFTs   . Leg ulcer ([place]) 10/27/2012   Chronic under care of wound clinic  . Multiple open wounds of lower extremity    chronic wounds B/LLE  . Sickle cell disease ([place])   . Sinus bradycardia by electrocardiogram 04/03/2012    Family History  Problem Relation Age of Onset  . Sickle cell anemia Sister   . Diabetes Mother   . Asthma Mother   . Sickle cell trait Mother   . Sickle cell trait Father   . Hypertension Father     Past Surgical History:  Procedure Laterality Date  . ALLOGRAFT APPLICATION Right 0/86/7619   Procedure: SURGICAL PREP FOR GRAFTING RIGHT LOWER EXTREMITY AND APPLICATION OF Jannifer Hick;  Surgeon: Irene Limbo, MD;  Location: [facility];  Service: Plastics;  Laterality: Right;  . APPLICATION OF A-CELL OF EXTREMITY Right 10/09/2015   Procedure: APPLICATION OF Jannifer Hick;  Surgeon: Irene Limbo, MD;  Location: [facility];  Service: Plastics;  Laterality: Right;  . BREAST SURGERY     left breast cyst aspiration  . CESAREAN SECTION    . CESAREAN SECTION N/A 01/06/2014   Procedure: CESAREAN SECTION;  Surgeon: Emily Filbert, MD;  Location: [facility];  Service: Obstetrics;  Laterality: N/A;  . CESAREAN SECTION N/A 05/04/2017   Procedure: CESAREAN SECTION;  Surgeon: Woodroe Mode, MD;  Location: [facility];  Service: Obstetrics;  Laterality: N/A;  . I&D EXTREMITY Right 10/09/2015   Procedure: SURGICAL PREPARATION FOR GRAFTING RIGHT ANKLE AND APPLICATION THERASKIN;  Surgeon: Irene Limbo, MD;  Location: [facility];  Service: Plastics;   Laterality: Right;  . SKIN FULL THICKNESS GRAFT Bilateral 06/17/2016   Procedure: SURGICAL PREP FOR GRAFTING, BILATERAL LOWER EXTREMITIES AND APPLICATION OF Jannifer Hick;  Surgeon: Irene Limbo, MD;  Location: [facility];  Service: Plastics;  Laterality: Bilateral;  . TONSILLECTOMY     Social History   Occupational History  . Occupation: Employed in home care.  Tobacco Use  . Smoking status: Never Smoker  . Smokeless tobacco: Never Used  Substance and Sexual Activity  . Alcohol use: No  . Drug use: No  . Sexual activity: Yes    Birth control/protection: None

## 2017-12-09 ENCOUNTER — Other Ambulatory Visit: Payer: Medicaid Other

## 2017-12-10 ENCOUNTER — Other Ambulatory Visit: Payer: Medicaid Other

## 2017-12-10 DIAGNOSIS — D5700 Hb-SS disease with crisis, unspecified: Secondary | ICD-10-CM

## 2017-12-11 LAB — CBC WITH DIFFERENTIAL/PLATELET
Basophils Absolute: 0.1 10*3/uL (ref 0.0–0.2)
Basos: 1 %
EOS (ABSOLUTE): 0.2 10*3/uL (ref 0.0–0.4)
Eos: 1 %
Hematocrit: 21.1 % — ABNORMAL LOW (ref 34.0–46.6)
Hemoglobin: 7.3 g/dL — ABNORMAL LOW (ref 11.1–15.9)
Immature Grans (Abs): 0.1 10*3/uL (ref 0.0–0.1)
Immature Granulocytes: 1 %
Lymphocytes Absolute: 2.3 10*3/uL (ref 0.7–3.1)
Lymphs: 18 %
MCH: 30.9 pg (ref 26.6–33.0)
MCHC: 34.6 g/dL (ref 31.5–35.7)
MCV: 89 fL (ref 79–97)
Monocytes Absolute: 1.9 10*3/uL — ABNORMAL HIGH (ref 0.1–0.9)
Monocytes: 15 %
NRBC: 10 % — ABNORMAL HIGH (ref 0–0)
Neutrophils Absolute: 7.8 10*3/uL — ABNORMAL HIGH (ref 1.4–7.0)
Neutrophils: 64 %
Platelets: 389 10*3/uL (ref 150–450)
RBC: 2.36 x10E6/uL — CL (ref 3.77–5.28)
RDW: 22.3 % — ABNORMAL HIGH (ref 12.3–15.4)
WBC: 12.3 10*3/uL — ABNORMAL HIGH (ref 3.4–10.8)

## 2017-12-19 ENCOUNTER — Other Ambulatory Visit: Payer: Self-pay | Admitting: Internal Medicine

## 2017-12-19 ENCOUNTER — Telehealth: Payer: Self-pay

## 2017-12-19 DIAGNOSIS — D571 Sickle-cell disease 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: Secondary | ICD-10-CM

## 2017-12-19 MED ORDER — OXYCODONE HCL 10 MG PO TABS: 10.0000 mg | ORAL_TABLET | Freq: Four times a day (QID) | ORAL | 0 refills | Status: DC | PRN

## 2017-12-19 NOTE — Telephone Encounter (Signed)
Refilled

## 2017-12-29 ENCOUNTER — Ambulatory Visit (INDEPENDENT_AMBULATORY_CARE_PROVIDER_SITE_OTHER): Payer: Medicaid Other | Admitting: Family Medicine

## 2017-12-29 ENCOUNTER — Encounter: Payer: Self-pay | Admitting: Family Medicine

## 2017-12-29 VITALS — BP 123/66 | HR 78 | Temp 98.4°F | Resp 14 | Ht 67.0 in | Wt 125.0 lb

## 2017-12-29 DIAGNOSIS — D571 Sickle-cell disease 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: Secondary | ICD-10-CM

## 2017-12-29 MED ORDER — IBUPROFEN 600 MG PO TABS: 600.0000 mg | ORAL_TABLET | Freq: Four times a day (QID) | ORAL | 2 refills | Status: DC

## 2017-12-29 NOTE — Patient Instructions (Signed)
Sickle Cell Anemia, Adult °Sickle cell anemia is a condition where your red blood cells are shaped like sickles. Red blood cells carry oxygen through the body. Sickle-shaped red blood cells do not live as long as normal red blood cells. They also clump together and block blood from flowing through the blood vessels. These things prevent the body from getting enough oxygen. Sickle cell anemia causes organ damage and pain. It also increases the risk of infection. °Follow these instructions at home: °· Drink enough fluid to keep your pee (urine) clear or pale yellow. Drink more in hot weather and during exercise. °· Do not smoke. Smoking lowers oxygen levels in the blood. °· Only take over-the-counter or prescription medicines as told by your doctor. °· Take antibiotic medicines as told by your doctor. Make sure you finish them even if you start to feel better. °· Take supplements as told by your doctor. °· Consider wearing a medical alert bracelet. This tells anyone caring for you in an emergency of your condition. °· When traveling, keep your medical information, doctors' names, and the medicines you take with you at all times. °· If you have a fever, do not take fever medicines right away. This could cover up a problem. Tell your doctor. °· Keep all follow-up visits with your doctor. Sickle cell anemia requires regular medical care. °Contact a doctor if: °You have a fever. °Get help right away if: °· You feel dizzy or faint. °· You have new belly (abdominal) pain, especially on the left side near the stomach area. °· You have a lasting, often uncomfortable and painful erection of the penis (priapism). If it is not treated right away, you will become unable to have sex (impotence). °· You have numbness in your arms or legs or you have a hard time moving them. °· You have a hard time talking. °· You have a fever or lasting symptoms for more than 2-3 days. °· You have a fever and your symptoms suddenly get  worse. °· You have signs or symptoms of infection. These include: °? Chills. °? Being more tired than normal (lethargy). °? Irritability. °? Poor eating. °? Throwing up (vomiting). °· You have pain that is not helped with medicine. °· You have shortness of breath. °· You have pain in your chest. °· You are coughing up pus-like or bloody mucus. °· You have a stiff neck. °· Your feet or hands swell or have pain. °· Your belly looks bloated. °· Your joints hurt. °This information is not intended to replace advice given to you by your health care provider. Make sure you discuss any questions you have with your health care provider. °Document Released: 02/24/2013 Document Revised: 10/12/2015 Document Reviewed: 12/16/2012 °Elsevier Interactive Patient Education © 2017 Elsevier Inc. ° °

## 2017-12-29 NOTE — Progress Notes (Signed)
PATIENT CARE CENTER INTERNAL MEDICINE AND SICKLE CELL CARE  SICKLE CELL ANEMIA FOLLOW UP VISIT PROVIDER: [name], [place]    [phone] [name]   Subjective :   Past Medical History:  Diagnosis Date  . Anemia 03/30/2012   Hx of sickle cell disease  . CAP (community acquired pneumonia)    2014  . Cholelithiasis 03/30/2012  . Chronic wound of extremity 04/01/2012  . Elevated LFTs   . Leg ulcer ([place]) 10/27/2012   Chronic under care of wound clinic  . Multiple open wounds of lower extremity    chronic wounds B/LLE  . Sickle cell disease ([place])   . Sinus bradycardia by electrocardiogram 04/03/2012    Social History   Socioeconomic History  . Marital status: Married    Spouse name: [name]  . Number of children: 1  . Years of education: Not on file  . Highest education level: Not on file  Occupational History  . Occupation: Employed in home care.  Social Needs  . Financial resource strain: Not on file  . Food insecurity:    Worry: Not on file    Inability: Not on file  . Transportation needs:    Medical: Not on file    Non-medical: Not on file  Tobacco Use  . Smoking status: Never Smoker  . Smokeless tobacco: Never Used  Substance and Sexual Activity  . Alcohol use: No  . Drug use: No  . Sexual activity: Yes    Birth control/protection: None  Lifestyle  . Physical activity:    Days per week: Not on file    Minutes per session: Not on file  . Stress: Not on file  Relationships  . Social connections:    Talks on phone: Not on file    Gets together: Not on file    Attends religious service: Not on file    Active member of club or organization: Not on file    Attends meetings of clubs or organizations: Not on file    Relationship status: Not on file  . Intimate partner violence:    Fear of current or ex partner: Not on file    Emotionally abused: Not on file    Physically abused: Not on file    Forced sexual activity: Not on file   Other Topics Concern  . Not on file  Social History Narrative   Lives with husband.    HPI   [name]  is a 47 y.o.  female who presents for a follow up for Sickle Cell Anemia. her last hospitalization was 03/2017. she has had 1 hospitalizations in the past 12 months due to delivery of child. Pain regimen includes: Ibuprofen and Oxycodone 10mg  q 6 h prn Hydrea Therapy: No Medication compliance: Yes  Pain today is 5/10 and is located: left hip(s)- patient with AVN and will have hip replacement in the near future. She is waiting on family to come from [place] of the country.   Patient drinks 80 ounces of fluid per day.    ROS  Objective  BP 123/66 (BP Location: Left Arm, Patient Position: Sitting, Cuff Size: Normal)   Pulse 78   Temp 98.4 F (36.9 C) (Oral)   Resp 14   Ht 5\' 7"  (1.702 m)   Wt 125 lb (56.7 kg)   SpO2 90%   BMI 19.58 kg/m    Physical Exam  Constitutional: She is oriented to person, place, and time. She appears well-developed and well-nourished. No distress.  HENT:  Head: Normocephalic and atraumatic.  Eyes: Pupils are equal, round, and reactive to light. Conjunctivae and EOM are normal.  Cardiovascular: Normal rate, regular rhythm and intact distal pulses.  Murmur heard. Abdominal: Soft. Bowel sounds are normal. She exhibits no distension and no mass. There is no tenderness.  Musculoskeletal: She exhibits tenderness (bilateral hips. no lower extremity edema).  Neurological: She is alert and oriented to person, place, and time.  Skin: Skin is warm and dry.  Nursing note and vitals reviewed.     Assessment   Encounter Diagnosis  Name Primary?  Marland Kitchen Hb-SS disease 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 ([place]) Yes     Plan  1. Hb-SS disease 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 ([place]) - ibuprofen (ADVIL,MOTRIN) 600 MG tablet; Take 1 tablet (600 mg total) by mouth every 6 (six) hours.  Dispense: 60 tablet; Refill: 2 - CBC with Differential - Basic Metabolic Panel - Ferritin   Return to care as  scheduled and prn. Patient verbalized understanding and agreed with plan of care.   1. Sickle cell disease - We discussed the need for good hydration, monitoring of hydration status, avoidance of heat, cold, stress, and infection triggers. We discussed the risks and benefits of Hydrea, including bone marrow suppression, the possibility of GI upset, skin ulcers, hair thinning, and teratogenicity. The patient was reminded of the need to seek medical attention of any symptoms of bleeding, anemia, or infection. Continue folic acid 1 mg daily to prevent aplastic bone marrow crises.   2. Pulmonary evaluation - Patient denies severe recurrent wheezes, shortness of breath with exercise, or persistent cough. If these symptoms develop, pulmonary function tests with spirometry will be ordered, and if abnormal, plan on referral to Pulmonology for further evaluation.  3. Cardiac - Routine screening for pulmonary hypertension is not recommended.  4. Eye - High risk of proliferative retinopathy. Annual eye exam with retinal exam recommended to patient.  5. Immunization status -  Yearly influenza vaccination is recommended, as well as being up to date with Meningococcal and Pneumococcal vaccines.   6. Acute and chronic painful episodes - We agreed on oxycodone 10 mg q 6 h prn and ibuprofen . We discussed that pt is to receive Schedule II prescriptions only from [place]. Pt is also aware that the prescription history is available to [place] online through the [facility] CSRS. Controlled substance agreement signed (Date). We reminded (Pt) that all patients receiving Schedule II narcotics must be seen for follow within one month of prescription being requested. We reviewed the terms of our pain agreement, including the need to keep medicines in a safe locked location away from children or pets, and the need to report excess sedation or constipation, measures to avoid constipation, and policies related to early refills and stolen  prescriptions. According to the [place] Chronic Pain Initiative program, we have reviewed details related to analgesia, adverse effects, aberrant behaviors.  7. Iron overload from chronic transfusion.  Not applicable. If this occurs will use Exjade for management.   8. Vitamin D deficiency - Drisdol 50,000 units weekly. Patient encouraged to take as prescribed.   The above recommendations are taken from the NIH Evidence-Based Management of Sickle Cell Disease: Expert Panel Report, 20149.   [name], FNP-BC Patient [facility] [address] [place], [place] [address] [phone]  This note has been created with Dragon speech recognition software and smart phrase technology. Any transcriptional errors are unintentional.

## 2017-12-30 LAB — CBC WITH DIFFERENTIAL/PLATELET
Basophils Absolute: 0.1 10*3/uL (ref 0.0–0.2)
Basos: 1 %
EOS (ABSOLUTE): 0.2 10*3/uL (ref 0.0–0.4)
Eos: 2 %
Hematocrit: 20.4 % — ABNORMAL LOW (ref 34.0–46.6)
Hemoglobin: 6.9 g/dL — CL (ref 11.1–15.9)
Immature Grans (Abs): 0.1 10*3/uL (ref 0.0–0.1)
Immature Granulocytes: 1 %
Lymphocytes Absolute: 2 10*3/uL (ref 0.7–3.1)
Lymphs: 18 %
MCH: 30 pg (ref 26.6–33.0)
MCHC: 33.8 g/dL (ref 31.5–35.7)
MCV: 89 fL (ref 79–97)
Monocytes Absolute: 1.5 10*3/uL — ABNORMAL HIGH (ref 0.1–0.9)
Monocytes: 14 %
NRBC: 25 % — ABNORMAL HIGH (ref 0–0)
Neutrophils Absolute: 7 10*3/uL (ref 1.4–7.0)
Neutrophils: 64 %
Platelets: 423 10*3/uL (ref 150–450)
RBC: 2.3 x10E6/uL — CL (ref 3.77–5.28)
RDW: 24.2 % — ABNORMAL HIGH (ref 12.3–15.4)
WBC: 10.7 10*3/uL (ref 3.4–10.8)

## 2017-12-30 LAB — BASIC METABOLIC PANEL
BUN/Creatinine Ratio: 9 (ref 9–23)
BUN: 4 mg/dL — ABNORMAL LOW (ref 6–24)
CO2: 20 mmol/L (ref 20–29)
Calcium: 9.1 mg/dL (ref 8.7–10.2)
Chloride: 105 mmol/L (ref 96–106)
Creatinine, Ser: 0.46 mg/dL — ABNORMAL LOW (ref 0.57–1.00)
GFR calc Af Amer: 138 mL/min/{1.73_m2} (ref >59)
GFR calc non Af Amer: 120 mL/min/{1.73_m2} (ref >59)
Glucose: 97 mg/dL (ref 65–99)
Potassium: 4.1 mmol/L (ref 3.5–5.2)
Sodium: 141 mmol/L (ref 134–144)

## 2017-12-30 LAB — FERRITIN: Ferritin: 749 ng/mL — ABNORMAL HIGH (ref 15–150)

## 2017-12-31 NOTE — Telephone Encounter (Signed)
Called, no answer. Left a message for patient to call back. Thanks!  

## 2017-12-31 NOTE — Telephone Encounter (Signed)
Please call this patient and see how she is feeling. Her hemoglobin is 6.9 which is low for her. If she is having shortness of breath, dizziness or any other symptoms, please have her schedule for another transfusion. Thanks.

## 2018-01-01 NOTE — Telephone Encounter (Signed)
Called and spoke with patient. She is no having any symptoms right now. She will come in tomorrow for a repeat lab draw. Thanks!

## 2018-01-02 ENCOUNTER — Other Ambulatory Visit: Payer: Medicaid Other

## 2018-01-02 DIAGNOSIS — D5700 Hb-SS disease with crisis, unspecified: Secondary | ICD-10-CM

## 2018-01-03 LAB — CBC WITH DIFFERENTIAL/PLATELET
Basophils Absolute: 0.1 10*3/uL (ref 0.0–0.2)
Basos: 0 %
EOS (ABSOLUTE): 0.2 10*3/uL (ref 0.0–0.4)
Eos: 1 %
Hematocrit: 19.8 % — ABNORMAL LOW (ref 34.0–46.6)
Hemoglobin: 6.8 g/dL — CL (ref 11.1–15.9)
Immature Grans (Abs): 0.1 10*3/uL (ref 0.0–0.1)
Immature Granulocytes: 1 %
Lymphocytes Absolute: 2 10*3/uL (ref 0.7–3.1)
Lymphs: 15 %
MCH: 31.2 pg (ref 26.6–33.0)
MCHC: 34.3 g/dL (ref 31.5–35.7)
MCV: 91 fL (ref 79–97)
Monocytes Absolute: 1.7 10*3/uL — ABNORMAL HIGH (ref 0.1–0.9)
Monocytes: 13 %
NRBC: 11 % — ABNORMAL HIGH (ref 0–0)
Neutrophils Absolute: 9 10*3/uL — ABNORMAL HIGH (ref 1.4–7.0)
Neutrophils: 70 %
Platelets: 382 10*3/uL (ref 150–450)
RBC: 2.18 x10E6/uL — CL (ref 3.77–5.28)
RDW: 24.9 % — ABNORMAL HIGH (ref 12.3–15.4)
WBC: 12.9 10*3/uL — ABNORMAL HIGH (ref 3.4–10.8)

## 2018-01-20 ENCOUNTER — Telehealth: Payer: Self-pay

## 2018-01-20 DIAGNOSIS — D571 Sickle-cell disease 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: Secondary | ICD-10-CM

## 2018-01-20 MED ORDER — OXYCODONE HCL 10 MG PO TABS: 10.0000 mg | ORAL_TABLET | Freq: Four times a day (QID) | ORAL | 0 refills | Status: DC | PRN

## 2018-01-20 NOTE — Telephone Encounter (Signed)
Refilled

## 2018-02-02 ENCOUNTER — Encounter: Payer: Self-pay | Admitting: Family Medicine

## 2018-02-02 ENCOUNTER — Ambulatory Visit (INDEPENDENT_AMBULATORY_CARE_PROVIDER_SITE_OTHER): Payer: Medicaid Other | Admitting: Family Medicine

## 2018-02-02 VITALS — BP 129/60 | HR 84 | Temp 99.1°F | Resp 14 | Ht 67.0 in | Wt 128.0 lb

## 2018-02-02 DIAGNOSIS — G894 Chronic pain syndrome: Secondary | ICD-10-CM | POA: Diagnosis not present

## 2018-02-02 DIAGNOSIS — Z23 Encounter for immunization: Secondary | ICD-10-CM

## 2018-02-02 DIAGNOSIS — M87051 Idiopathic aseptic necrosis of right femur: Secondary | ICD-10-CM

## 2018-02-02 DIAGNOSIS — D571 Sickle-cell disease 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: Secondary | ICD-10-CM | POA: Diagnosis not present

## 2018-02-02 DIAGNOSIS — M87052 Idiopathic aseptic necrosis of left femur: Secondary | ICD-10-CM

## 2018-02-02 DIAGNOSIS — M79642 Pain in left hand: Secondary | ICD-10-CM

## 2018-02-02 DIAGNOSIS — E559 Vitamin D deficiency, unspecified: Secondary | ICD-10-CM | POA: Diagnosis not present

## 2018-02-02 MED ORDER — OXYCODONE HCL 10 MG PO TABS: 10.0000 mg | ORAL_TABLET | ORAL | 0 refills | Status: DC | PRN

## 2018-02-02 MED ORDER — IBUPROFEN 600 MG PO TABS: 600.0000 mg | ORAL_TABLET | Freq: Four times a day (QID) | ORAL | 2 refills | Status: DC

## 2018-02-02 MED ORDER — FLUCONAZOLE 150 MG PO TABS: 150.0000 mg | ORAL_TABLET | Freq: Once | ORAL | 0 refills | Status: AC

## 2018-02-02 MED ORDER — VITAMIN D 50 MCG (2000 UT) PO CAPS: 2.0000 | ORAL_CAPSULE | Freq: Every day | ORAL | 11 refills | Status: DC

## 2018-02-02 NOTE — Patient Instructions (Signed)
Sickle Cell Anemia, Adult °Sickle cell anemia is a condition where your red blood cells are shaped like sickles. Red blood cells carry oxygen through the body. Sickle-shaped red blood cells do not live as long as normal red blood cells. They also clump together and block blood from flowing through the blood vessels. These things prevent the body from getting enough oxygen. Sickle cell anemia causes organ damage and pain. It also increases the risk of infection. °Follow these instructions at home: °· Drink enough fluid to keep your pee (urine) clear or pale yellow. Drink more in hot weather and during exercise. °· Do not smoke. Smoking lowers oxygen levels in the blood. °· Only take over-the-counter or prescription medicines as told by your doctor. °· Take antibiotic medicines as told by your doctor. Make sure you finish them even if you start to feel better. °· Take supplements as told by your doctor. °· Consider wearing a medical alert bracelet. This tells anyone caring for you in an emergency of your condition. °· When traveling, keep your medical information, doctors' names, and the medicines you take with you at all times. °· If you have a fever, do not take fever medicines right away. This could cover up a problem. Tell your doctor. °· Keep all follow-up visits with your doctor. Sickle cell anemia requires regular medical care. °Contact a doctor if: °You have a fever. °Get help right away if: °· You feel dizzy or faint. °· You have new belly (abdominal) pain, especially on the left side near the stomach area. °· You have a lasting, often uncomfortable and painful erection of the penis (priapism). If it is not treated right away, you will become unable to have sex (impotence). °· You have numbness in your arms or legs or you have a hard time moving them. °· You have a hard time talking. °· You have a fever or lasting symptoms for more than 2-3 days. °· You have a fever and your symptoms suddenly get  worse. °· You have signs or symptoms of infection. These include: °? Chills. °? Being more tired than normal (lethargy). °? Irritability. °? Poor eating. °? Throwing up (vomiting). °· You have pain that is not helped with medicine. °· You have shortness of breath. °· You have pain in your chest. °· You are coughing up pus-like or bloody mucus. °· You have a stiff neck. °· Your feet or hands swell or have pain. °· Your belly looks bloated. °· Your joints hurt. °This information is not intended to replace advice given to you by your health care provider. Make sure you discuss any questions you have with your health care provider. °Document Released: 02/24/2013 Document Revised: 10/12/2015 Document Reviewed: 12/16/2012 °Elsevier Interactive Patient Education © 2017 Elsevier Inc. ° °

## 2018-02-02 NOTE — Progress Notes (Signed)
PATIENT CARE CENTER INTERNAL MEDICINE AND SICKLE CELL CARE  SICKLE CELL ANEMIA FOLLOW UP VISIT PROVIDER: [name], FNP    Subjective:   [name]  is a 47 y.o.  female who  has a past medical history of Anemia (03/30/2012), CAP (community acquired pneumonia), Cholelithiasis (03/30/2012), Chronic wound of extremity (04/01/2012), Elevated LFTs, Leg ulcer ([place]) (10/27/2012), Multiple open wounds of lower extremity, Sickle cell disease ([place]), and Sinus bradycardia by electrocardiogram (04/03/2012). presents for a follow up for Sickle Cell Anemia.  No hospital admissions in the past 12 months. Patient is being followed by wound care for leg ulcer on the lower right extremity.  Pain regimen includes: Ibuprofen and Oxycodone.  Hydrea Therapy: No Medication compliance: Yes  Pain today is 7/10 in the hips and lower extremities  Patient reports adequate daily hydration Last transfusion 12/25/2017 Patient states that she has vaginal itching externally x 2-3 days. Denies vaginal discharge.  She slammed her finger x 1 month ago and is having pain and limited ROM.   Review of Systems  Constitutional: Negative.   HENT: Negative.   Eyes: Negative.   Respiratory: Negative.   Cardiovascular: Negative.   Gastrointestinal: Negative.   Genitourinary: Negative.   Musculoskeletal: Positive for joint pain and myalgias.  Skin: Negative.   Neurological: Negative.   Psychiatric/Behavioral: Negative.     Objective:   Objective  BP 129/60 (BP Location: Left Arm, Patient Position: Sitting, Cuff Size: Normal)   Pulse 84   Temp 99.1 F (37.3 C) (Oral)   Resp 14   Ht 5\' 7"  (1.702 m)   Wt 128 lb (58.1 kg)   LMP 01/14/2018   SpO2 (!) 87%   BMI 20.05 kg/m    Physical Exam  Constitutional: She is oriented to person, place, and time. She appears well-developed and well-nourished. No distress.  HENT:  Head: Normocephalic and atraumatic.  Eyes: Pupils are equal, round, and reactive to light.  Conjunctivae and EOM are normal.  Neck: Normal range of motion.  Cardiovascular: Normal rate, regular rhythm, normal heart sounds and intact distal pulses.  Pulmonary/Chest: Effort normal and breath sounds normal. No respiratory distress.  Abdominal: Soft. Bowel sounds are normal. She exhibits no distension.  Musculoskeletal: Normal range of motion. Tenderness: bilateral hips.        Hands: Neurological: She is alert and oriented to person, place, and time.  Skin: Skin is warm and dry.  Psychiatric: She has a normal mood and affect. Her behavior is normal. Thought content normal.  Nursing note and vitals reviewed.    Assessment/Plan:   Assessment   Encounter Diagnoses  Name Primary?  . Chronic pain syndrome   . Hb-SS disease 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 ([place]) Yes  . Avascular necrosis of bones of both hips ([place])   . Vitamin D deficiency   . Left hand pain   . Need for immunization against influenza      Plan  1. Chronic pain syndrome The current medical regimen is effective;  continue present plan and medications.  2. Hb-SS disease 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 [facility]) The current medical regimen is effective;  continue present plan and medications. - CBC with Differential - Comprehensive metabolic panel - Iron, TIBC and Ferritin Panel - VITAMIN D 25 Hydroxy (Vit-D Deficiency, Fractures) - Oxycodone HCl 10 MG TABS; Take 1 tablet (10 mg total) by mouth every 4 (four) hours as needed for up to 15 days (pain).  Dispense: 90 tablet; Refill: 0 - ibuprofen (ADVIL,MOTRIN) 600 MG tablet; Take 1 tablet (600 mg  total) by mouth every 6 (six) hours.  Dispense: 60 tablet; Refill: 2  3. Avascular necrosis of bones of both hips ([facility]) The current medical regimen is effective;  continue present plan and medications.  4. Vitamin D deficiency The current medical regimen is effective;  continue present plan and medications.  - VITAMIN D 25 Hydroxy (Vit-D Deficiency, Fractures) - Cholecalciferol (VITAMIN D) 2000 units  CAPS; Take 2 capsules (4,000 Units total) by mouth daily.  Dispense: 30 capsule; Refill: 11  5. Left hand pain Hx of injury. Xray ordered.  - DG Finger Little Left; Future  6. Need for immunization against influenza Influenza vaccination given in the office visit today.  - Flu Vaccine QUAD 36+ mos IM   Return to care as scheduled and prn. Patient verbalized understanding and agreed with plan of care.   1. Sickle cell disease  We discussed the need for good hydration, monitoring of hydration status, avoidance of heat, cold, stress, and infection triggers. We discussed the risks and benefits of Hydrea, including bone marrow suppression, the possibility of GI upset, skin ulcers, hair thinning, and teratogenicity. The patient was reminded of the need to seek medical attention of any symptoms of bleeding, anemia, or infection. Continue folic acid 1 mg daily to prevent aplastic bone marrow crises.   2. Pulmonary evaluation - Patient denies severe recurrent wheezes, shortness of breath with exercise, or persistent cough. If these symptoms develop, pulmonary function tests with spirometry will be ordered, and if abnormal, plan on referral to Pulmonology for further evaluation.  3. Cardiac - Routine screening for pulmonary hypertension is not recommended.  4. Eye - High risk of proliferative retinopathy. Annual eye exam with retinal exam recommended to patient.  5. Immunization status -  Yearly influenza vaccination is recommended, as well as being up to date with Meningococcal and Pneumococcal vaccines.   6. Acute and chronic painful episodes - We discussed that pt is to receive Schedule II prescriptions only from [place]. Pt is also aware that the prescription history is available to [place] online through the [facility] CSRS. Controlled substance agreement signed.  We reminded Jaelle Campanile that all patients receiving Schedule II narcotics must be seen for follow within one month of prescription being requested.  We reviewed the terms of our pain agreement, including the need to keep medicines in a safe locked location away from children or pets, and the need to report excess sedation or constipation, measures to avoid constipation, and policies related to early refills and stolen prescriptions. According to the [place] Chronic Pain Initiative program, we have reviewed details related to analgesia, adverse effects, aberrant behaviors.  7. Iron overload from chronic transfusion.  Not applicable at this time.  If this occurs will use Exjade for management.   8. Vitamin D deficiency - Drisdol 50,000 units weekly. Patient encouraged to take as prescribed.   The above recommendations are taken from the NIH Evidence-Based Management of Sickle Cell Disease: Expert Panel Report, 20149.   [name], FNP-BC [facility] [address] [place], [place] [address] [phone]  This note has been created with Dragon speech recognition software and smart phrase technology. Any transcriptional errors are unintentional.

## 2018-02-03 LAB — COMPREHENSIVE METABOLIC PANEL
ALT: 10 IU/L (ref 0–32)
AST: 25 IU/L (ref 0–40)
Albumin/Globulin Ratio: 1.7 (ref 1.2–2.2)
Albumin: 4.5 g/dL (ref 3.5–5.5)
Alkaline Phosphatase: 48 IU/L (ref 39–117)
BUN/Creatinine Ratio: 14 (ref 9–23)
BUN: 6 mg/dL (ref 6–24)
Bilirubin Total: 4.2 mg/dL — ABNORMAL HIGH (ref 0.0–1.2)
CO2: 21 mmol/L (ref 20–29)
Calcium: 9.1 mg/dL (ref 8.7–10.2)
Chloride: 106 mmol/L (ref 96–106)
Creatinine, Ser: 0.42 mg/dL — ABNORMAL LOW (ref 0.57–1.00)
GFR calc Af Amer: 142 mL/min/{1.73_m2} (ref >59)
GFR calc non Af Amer: 123 mL/min/{1.73_m2} (ref >59)
Globulin, Total: 2.7 g/dL (ref 1.5–4.5)
Glucose: 83 mg/dL (ref 65–99)
Potassium: 4.2 mmol/L (ref 3.5–5.2)
Sodium: 142 mmol/L (ref 134–144)
Total Protein: 7.2 g/dL (ref 6.0–8.5)

## 2018-02-03 LAB — CBC WITH DIFFERENTIAL/PLATELET
Basophils Absolute: 0.1 10*3/uL (ref 0.0–0.2)
Basos: 1 %
EOS (ABSOLUTE): 0.2 10*3/uL (ref 0.0–0.4)
Eos: 2 %
Hematocrit: 19.1 % — ABNORMAL LOW (ref 34.0–46.6)
Hemoglobin: 6.6 g/dL — CL (ref 11.1–15.9)
Immature Grans (Abs): 0.1 10*3/uL (ref 0.0–0.1)
Immature Granulocytes: 1 %
Lymphocytes Absolute: 2.3 10*3/uL (ref 0.7–3.1)
Lymphs: 19 %
MCH: 30.7 pg (ref 26.6–33.0)
MCHC: 34.6 g/dL (ref 31.5–35.7)
MCV: 89 fL (ref 79–97)
Monocytes Absolute: 1.7 10*3/uL — ABNORMAL HIGH (ref 0.1–0.9)
Monocytes: 14 %
NRBC: 14 % — ABNORMAL HIGH (ref 0–0)
Neutrophils Absolute: 7.5 10*3/uL — ABNORMAL HIGH (ref 1.4–7.0)
Neutrophils: 63 %
Platelets: 396 10*3/uL (ref 150–450)
RBC: 2.15 x10E6/uL — CL (ref 3.77–5.28)
RDW: 23.8 % — ABNORMAL HIGH (ref 12.3–15.4)
WBC: 11.7 10*3/uL — ABNORMAL HIGH (ref 3.4–10.8)

## 2018-02-03 LAB — IRON,TIBC AND FERRITIN PANEL
Ferritin: 650 ng/mL — ABNORMAL HIGH (ref 15–150)
Iron Saturation: 47 % (ref 15–55)
Iron: 83 ug/dL (ref 27–159)
Total Iron Binding Capacity: 176 ug/dL — ABNORMAL LOW (ref 250–450)
UIBC: 93 ug/dL — ABNORMAL LOW (ref 131–425)

## 2018-02-03 LAB — VITAMIN D 25 HYDROXY (VIT D DEFICIENCY, FRACTURES): Vit D, 25-Hydroxy: 22.5 ng/mL — ABNORMAL LOW (ref 30.0–100.0)

## 2018-02-05 ENCOUNTER — Telehealth: Payer: Self-pay

## 2018-02-05 NOTE — Telephone Encounter (Signed)
Called and spoke with patient. Advised that her hgb is low and that she need to come in for stat labs and possibly a blood transfusion. She can't come until Monday 02/09/2018. She was put on the schedule for Monday morning 02/09/2018 @8am . She was advised if she had any fatigue or SOB to go to ER. Patient verbalized understanding. Thanks!

## 2018-02-05 NOTE — Telephone Encounter (Signed)
-----   Message from [name], [place] sent at 02/05/2018  3:05 PM EDT ----- Patient with hgb of 6.6. Please have her come in for stat labs and blood transfusion if still low or if she is having SOB and fatigue.

## 2018-02-09 ENCOUNTER — Telehealth: Payer: Self-pay

## 2018-02-09 ENCOUNTER — Ambulatory Visit (HOSPITAL_COMMUNITY)
Admission: RE | Admit: 2018-02-09 | Discharge: 2018-02-09 | Disposition: A | Discharge status: Home / Self Care | Payer: Medicaid Other | Source: Ambulatory Visit | Attending: Family Medicine | Admitting: Family Medicine

## 2018-02-09 DIAGNOSIS — D649 Anemia, unspecified: Secondary | ICD-10-CM | POA: Insufficient documentation

## 2018-02-09 LAB — CBC
HCT: 17.7 % — ABNORMAL LOW (ref 36.0–46.0)
Hemoglobin: 6.4 g/dL — CL (ref 12.0–15.0)
MCH: 30.3 pg (ref 26.0–34.0)
MCHC: 36.2 g/dL — ABNORMAL HIGH (ref 30.0–36.0)
MCV: 83.9 fL (ref 78.0–100.0)
Platelets: 447 10*3/uL — ABNORMAL HIGH (ref 150–400)
RBC: 2.11 MIL/uL — ABNORMAL LOW (ref 3.87–5.11)
RDW: 26.9 % — ABNORMAL HIGH (ref 11.5–15.5)
WBC: 14.2 10*3/uL — ABNORMAL HIGH (ref 4.0–10.5)

## 2018-02-09 NOTE — Telephone Encounter (Signed)
Patient returned call and was notified of Hgb level of 6.4.  Encouraged to return in the morning for her appointment to receive 2 units of PRBCs here at the day hospital.  She stated she would be here.

## 2018-02-09 NOTE — Progress Notes (Signed)
PATIENT CARE CENTER NOTE  Diagnosis: [place]    Provider: Mena Pauls, FNP   Procedure: CBC and Type & Screen   Note: Patient labs drawn. Tolerated well with no adverse reaction. Patient to return tomorrow for blood transfusion. Patient alert, oriented and ambulatory at discharge.

## 2018-02-09 NOTE — Progress Notes (Addendum)
CRITICAL VALUE ALERT  Critical Value:  Hgb 6.4  Date & Time Notied:  02/09/18 7308  Provider Notified: [name] FNP-BC  Orders Received/Actions taken: Orders to Transfuse 2U PRBC tomorrow received

## 2018-02-10 ENCOUNTER — Ambulatory Visit (HOSPITAL_COMMUNITY)
Admission: RE | Admit: 2018-02-10 | Discharge: 2018-02-10 | Disposition: A | Discharge status: Home / Self Care | Payer: Medicaid Other | Source: Ambulatory Visit | Attending: Family Medicine | Admitting: Family Medicine

## 2018-02-10 DIAGNOSIS — M79642 Pain in left hand: Secondary | ICD-10-CM | POA: Diagnosis not present

## 2018-02-10 DIAGNOSIS — T1490XA Injury, unspecified, initial encounter: Secondary | ICD-10-CM | POA: Diagnosis present

## 2018-02-10 DIAGNOSIS — S62617A Displaced fracture of proximal phalanx of left little finger, initial encounter for closed fracture: Secondary | ICD-10-CM | POA: Diagnosis not present

## 2018-02-10 DIAGNOSIS — D649 Anemia, unspecified: Secondary | ICD-10-CM | POA: Diagnosis not present

## 2018-02-10 LAB — PREPARE RBC (CROSSMATCH)

## 2018-02-10 MED ORDER — OXYCODONE HCL 5 MG PO TABS
10.0000 mg | ORAL_TABLET | Freq: Once | ORAL | Status: AC
  Administered 2018-02-10: 10 mg via ORAL
  Filled 2018-02-10: qty 2

## 2018-02-10 MED ORDER — SODIUM CHLORIDE 0.9% IV SOLUTION
Freq: Once | INTRAVENOUS | Status: AC
  Administered 2018-02-10: 13:00:00 via INTRAVENOUS

## 2018-02-10 NOTE — Progress Notes (Signed)
Patient received 2 units of packed red blood cells as ordered  via PIV. Tolerated well, vitals stable, discharge instructions given, verbalized understanding. Patient alert, oriented and ambulatory at the time of discharge.

## 2018-02-10 NOTE — Discharge Instructions (Signed)

## 2018-02-11 ENCOUNTER — Other Ambulatory Visit: Payer: Self-pay | Admitting: Family Medicine

## 2018-02-11 DIAGNOSIS — T148XXA Other injury of unspecified body region, initial encounter: Secondary | ICD-10-CM

## 2018-02-11 LAB — TYPE AND SCREEN
ABO/RH(D): B POS
Antibody Screen: NEGATIVE
Unit division: 0
Unit division: 0

## 2018-02-11 LAB — BPAM RBC
Blood Product Expiration Date: 201910092359
Blood Product Expiration Date: 201910272359
ISSUE DATE / TIME: 201909240855
ISSUE DATE / TIME: 201909240855
Unit Type and Rh: 5100
Unit Type and Rh: 5100

## 2018-02-11 NOTE — Progress Notes (Signed)
Can you please call her and inform her of the above info? Thanks

## 2018-02-16 ENCOUNTER — Ambulatory Visit (INDEPENDENT_AMBULATORY_CARE_PROVIDER_SITE_OTHER): Payer: Medicaid Other | Admitting: Physician Assistant

## 2018-02-25 ENCOUNTER — Ambulatory Visit (INDEPENDENT_AMBULATORY_CARE_PROVIDER_SITE_OTHER): Payer: Medicaid Other | Admitting: Orthopaedic Surgery

## 2018-02-25 ENCOUNTER — Encounter (INDEPENDENT_AMBULATORY_CARE_PROVIDER_SITE_OTHER): Payer: Self-pay | Admitting: Orthopaedic Surgery

## 2018-02-25 DIAGNOSIS — M20012 Mallet finger of left finger(s): Secondary | ICD-10-CM

## 2018-02-25 NOTE — Progress Notes (Signed)
Office Visit Note   Patient: [name]           Date of Birth: 05-28-1970           MRN: [id number] Visit Date: 02/25/2018              Requested by: [name], [address] [place], [place] [id number] PCP: [name], FNP   Assessment & Plan: Visit Diagnoses:  1. Mallet deformity of left little finger     Plan: Left fifth finger is Betadine and ethyl chloride she is in sinus skin digital block is performed with 2 cc of lidocaine medial lateral aspect of the fifth finger.  Once this is performed the finger is brought back out to length and full extension and she is placed in a finger splint.  Patient tolerated this well. I explained to her and demonstrated that she needs to only take the splint off for hygiene purposes and she needs to hold her tip of the left finger out to full extension when out of the splint.  I explained the importance of this and the fact that if she would end up with a significant deformity if she could not keep it at full extension at all times.  She also understands that she most likely will end up with slight deformity of the finger.  She does not wish any surgical procedure at this point time.  See her back in 1 week to check her progress.  Follow-Up Instructions: Return in about 1 week (around 03/04/2018).   Orders:  No orders of the defined types were placed in this encounter.  No orders of the defined types were placed in this encounter.     Procedures: No procedures performed   Clinical Data: No additional findings.   Subjective: Chief Complaint  Patient presents with  . Left Hand - Fracture    HPI [name] comes in today due to 1/5 distal phalanx avulsion mallet finger injury.  This occurred on 02/10/2018 while moving a refrigerator.  She states she is having pain that she cannot straighten the finger.  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  She was sent for radiographs  of her finger on 02/10/2018 and this showed an avulsion off the dorsal aspect of the fifth distal phalanx with subluxation at the fifth DIP joint consistent with a bony mallet deformity.  Patient also has avascular necrosis of both hips which she states does not bother her much at this time.  She has wound healing problems of her right lower leg. Review of Systems Please see HPI otherwise negative or noncontributory  Objective: Vital Signs: There were no vitals taken for this visit.  Physical Exam  Constitutional: She is oriented to person, place, and time. She appears well-developed and well-nourished. No distress.  Pulmonary/Chest: Effort normal.  Neurological: She is alert and oriented to person, place, and time.  Skin: She is not diaphoretic.  Psychiatric: She has a normal mood and affect.    Ortho Exam Left hand sensation intact.  She has a mallet finger deformity of the fifth finger.  No rashes skin lesions ulcerations.  Radial pulses intact left hand. Specialty Comments:  No specialty comments available.  Imaging: No results found.   PMFS History: Patient Active Problem List   Diagnosis Date Noted  . Avascular necrosis of bone of hip, left ([place]) 12/08/2017  . Avascular necrosis of bone of hip, right ([place]) 12/08/2017  .  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 Anemia 11/26/2017  . Abnormal quad screen   . Ringworm of body 04/05/2017  . Ulcer of right lower extremity ([place])   . 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 History of pre-eclampsia 12/16/2016  . Previous cesarean delivery x 2 07/22/2013  . Hemochromatosis 11/10/2012  . Chronic pain syndrome 10/13/2012  . Leukocytosis 03/30/2012   Past Medical History:  Diagnosis Date  . Anemia 03/30/2012   Hx of sickle cell disease  . CAP (community acquired pneumonia)    2014  . Cholelithiasis 03/30/2012  . Chronic wound of extremity 04/01/2012  . Elevated LFTs   . Leg ulcer ([place]) 10/27/2012     Chronic under care of wound clinic  . Multiple open wounds of lower extremity    chronic wounds B/LLE  . Sickle cell disease ([place])   . Sinus bradycardia by electrocardiogram 04/03/2012    Family History  Problem Relation Age of Onset  . Sickle cell anemia Sister   . Diabetes Mother   . Asthma Mother   . Sickle cell trait Mother   . Sickle cell trait Father   . Hypertension Father     Past Surgical History:  Procedure Laterality Date  . ALLOGRAFT APPLICATION Right 8/89/1694   Procedure: SURGICAL PREP FOR GRAFTING RIGHT LOWER EXTREMITY AND APPLICATION OF Jannifer Hick;  Surgeon: Irene Limbo, MD;  Location: [facility];  Service: Plastics;  Laterality: Right;  . APPLICATION OF A-CELL OF EXTREMITY Right 10/09/2015   Procedure: APPLICATION OF Jannifer Hick;  Surgeon: Irene Limbo, MD;  Location: [facility];  Service: Plastics;  Laterality: Right;  . BREAST SURGERY     left breast cyst aspiration  . CESAREAN SECTION    . CESAREAN SECTION N/A 01/06/2014   Procedure: CESAREAN SECTION;  Surgeon: Emily Filbert, MD;  Location: [facility];  Service: Obstetrics;  Laterality: N/A;  . CESAREAN SECTION N/A 05/04/2017   Procedure: CESAREAN SECTION;  Surgeon: Woodroe Mode, MD;  Location: [facility];  Service: Obstetrics;  Laterality: N/A;  . I&D EXTREMITY Right 10/09/2015   Procedure: SURGICAL PREPARATION FOR GRAFTING RIGHT ANKLE AND APPLICATION THERASKIN;  Surgeon: Irene Limbo, MD;  Location: [facility];  Service: Plastics;  Laterality: Right;  . SKIN FULL THICKNESS GRAFT Bilateral 06/17/2016   Procedure: SURGICAL PREP FOR GRAFTING, BILATERAL LOWER EXTREMITIES AND APPLICATION OF Jannifer Hick;  Surgeon: Irene Limbo, MD;  Location: [facility];  Service: Plastics;  Laterality: Bilateral;  . TONSILLECTOMY     Social History   Occupational History  . Occupation: Employed in home care.  Tobacco Use  . Smoking status: Never Smoker  . Smokeless  tobacco: Never Used  Substance and Sexual Activity  . Alcohol use: No  . Drug use: No  . Sexual activity: Yes    Birth control/protection: None

## 2018-02-26 ENCOUNTER — Telehealth: Payer: Self-pay

## 2018-02-26 NOTE — Telephone Encounter (Signed)
Left a vm that appointment is 10/14 at 11am and to call and reschedule if they are unable to make appointment

## 2018-03-02 ENCOUNTER — Ambulatory Visit (INDEPENDENT_AMBULATORY_CARE_PROVIDER_SITE_OTHER): Payer: Medicaid Other | Admitting: Family Medicine

## 2018-03-02 ENCOUNTER — Encounter: Payer: Self-pay | Admitting: Family Medicine

## 2018-03-02 VITALS — BP 128/67 | HR 82 | Temp 98.3°F | Resp 14 | Ht 67.0 in | Wt 131.0 lb

## 2018-03-02 DIAGNOSIS — G894 Chronic pain syndrome: Secondary | ICD-10-CM

## 2018-03-02 DIAGNOSIS — E559 Vitamin D deficiency, unspecified: Secondary | ICD-10-CM | POA: Diagnosis not present

## 2018-03-02 DIAGNOSIS — D5700 Hb-SS disease with crisis, unspecified: Secondary | ICD-10-CM

## 2018-03-02 DIAGNOSIS — D571 Sickle-cell disease 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: Secondary | ICD-10-CM

## 2018-03-02 LAB — POCT URINALYSIS DIPSTICK
Bilirubin, UA: NEGATIVE
Blood, UA: NEGATIVE
Glucose, UA: NEGATIVE
Ketones, UA: NEGATIVE
Leukocytes, UA: NEGATIVE
Nitrite, UA: NEGATIVE
Protein, UA: POSITIVE — AB
Spec Grav, UA: 1.01 (ref 1.010–1.025)
Urobilinogen, UA: 1 E.U./dL
pH, UA: 6 (ref 5.0–8.0)

## 2018-03-02 MED ORDER — OXYCODONE HCL 10 MG PO TABS: 10.0000 mg | ORAL_TABLET | ORAL | 0 refills | Status: DC | PRN

## 2018-03-02 NOTE — Patient Instructions (Signed)
Sickle Cell Anemia, Adult °Sickle cell anemia is a condition where your red blood cells are shaped like sickles. Red blood cells carry oxygen through the body. Sickle-shaped red blood cells do not live as long as normal red blood cells. They also clump together and block blood from flowing through the blood vessels. These things prevent the body from getting enough oxygen. Sickle cell anemia causes organ damage and pain. It also increases the risk of infection. °Follow these instructions at home: °· Drink enough fluid to keep your pee (urine) clear or pale yellow. Drink more in hot weather and during exercise. °· Do not smoke. Smoking lowers oxygen levels in the blood. °· Only take over-the-counter or prescription medicines as told by your doctor. °· Take antibiotic medicines as told by your doctor. Make sure you finish them even if you start to feel better. °· Take supplements as told by your doctor. °· Consider wearing a medical alert bracelet. This tells anyone caring for you in an emergency of your condition. °· When traveling, keep your medical information, doctors' names, and the medicines you take with you at all times. °· If you have a fever, do not take fever medicines right away. This could cover up a problem. Tell your doctor. °· Keep all follow-up visits with your doctor. Sickle cell anemia requires regular medical care. °Contact a doctor if: °You have a fever. °Get help right away if: °· You feel dizzy or faint. °· You have new belly (abdominal) pain, especially on the left side near the stomach area. °· You have a lasting, often uncomfortable and painful erection of the penis (priapism). If it is not treated right away, you will become unable to have sex (impotence). °· You have numbness in your arms or legs or you have a hard time moving them. °· You have a hard time talking. °· You have a fever or lasting symptoms for more than 2-3 days. °· You have a fever and your symptoms suddenly get  worse. °· You have signs or symptoms of infection. These include: °? Chills. °? Being more tired than normal (lethargy). °? Irritability. °? Poor eating. °? Throwing up (vomiting). °· You have pain that is not helped with medicine. °· You have shortness of breath. °· You have pain in your chest. °· You are coughing up pus-like or bloody mucus. °· You have a stiff neck. °· Your feet or hands swell or have pain. °· Your belly looks bloated. °· Your joints hurt. °This information is not intended to replace advice given to you by your health care provider. Make sure you discuss any questions you have with your health care provider. °Document Released: 02/24/2013 Document Revised: 10/12/2015 Document Reviewed: 12/16/2012 °Elsevier Interactive Patient Education © 2017 Elsevier Inc. ° °

## 2018-03-02 NOTE — Progress Notes (Signed)
PAT[facility]ARE [facility]ENTER INTERNAL MEDICINE AND SICKLE CELL CARE  SICKLE CELL ANEMIA FOLLOW UP VISIT PROVIDER: [name], FNP    Subjective:   [name]  is a 47 y.o.  female who  has a past medical history of Anemia (03/30/2012), CAP (community acquired pneumonia), Cholelithiasis (03/30/2012), Chronic wound of extremity (04/01/2012), Elevated LFTs, Leg ulcer ([place]) (10/27/2012), Multiple open wounds of lower extremity, Sickle cell disease ([place]), and Sinus bradycardia by electrocardiogram (04/03/2012). presents for a follow up for Sickle Cell Anemia.  No hospital admissions in the past 12 months. Patient is being followed by wound care for leg ulcer on the lower right extremity.  Pain regimen includes: Ibuprofen and Oxycodone.  Hydrea Therapy: No Medication compliance: Yes Pain today is 7/10 due to the wounds on the lower extremities. Right LE. . The patient reports adequate daily hydration.     Review of Systems  Constitutional: Negative.   HENT: Negative.   Eyes: Negative.   Respiratory: Negative.   Cardiovascular: Negative.   Gastrointestinal: Negative.   Genitourinary: Negative.   Musculoskeletal: Positive for myalgias.  Skin: Negative.   Neurological: Negative.   Psychiatric/Behavioral: Negative.     Objective:   Objective  BP 128/67 (BP Location: Right Arm, Patient Position: Sitting, Cuff Size: Normal)   Pulse 82   Temp 98.3 F (36.8 C) (Oral)   Resp 14   Ht 5\' 7"  (1.702 m)   Wt 131 lb (59.4 kg)   LMP 02/14/2018   SpO2 93%   BMI 20.52 kg/m    Physical Exam  Constitutional: She is oriented to person, place, and time. She appears well-developed and well-nourished. No distress.  HENT:  Head: Normocephalic and atraumatic.  Eyes: Pupils are equal, round, and reactive to light. Conjunctivae and EOM are normal.  Neck: Normal range of motion.  Cardiovascular: Normal rate, regular rhythm and intact distal pulses.  Murmur heard. Pulmonary/Chest: Effort normal  and breath sounds normal. No respiratory distress.  Abdominal: Soft. Bowel sounds are normal. She exhibits no distension.  Musculoskeletal: Normal range of motion. She exhibits tenderness (patient with gauze wrap noted to the RLE. ).  Neurological: She is alert and oriented to person, place, and time.  Skin: Skin is warm and dry.  Psychiatric: She has a normal mood and affect. Her behavior is normal. Thought content normal.  Nursing note and vitals reviewed.   Assessment/Plan:   Assessment   Encounter Diagnoses  Name Primary?  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 Hb-SS disease 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 ([place]) Yes  . Chronic pain syndrome   . Vitamin D deficiency      Plan   Hb-SS disease 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 ([place]) Pending labs. Will adjust medications accordingly.   - Urinalysis Dipstick - Oxycodone HCl 10 MG TABS; Take 1 tablet (10 mg total) by mouth every 4 (four) hours as needed for up to 15 days (pain).  Dispense: 90 tablet; Refill: 0 - CBC With Differential - Comprehensive metabolic panel - Ferritin - VITAMIN D 25 Hydroxy (Vit-D Deficiency, Fractures) - Folate  3. Chronic pain syndrome The current medical regimen is effective;  continue present plan and medications.  4. Vitamin D deficiency Pending labs. Will adjust medications accordingly.   - VITAMIN D 25 Hydroxy (Vit-D Deficiency, Fractures) - Folate   Return to care as scheduled and prn. Patient verbalized understanding and agreed with plan of care.   1. Sickle cell disease-  We discussed the need for good hydration, monitoring of hydration status, avoidance of  heat, cold, stress, and infection triggers. We discussed the risks and benefits of Hydrea, including bone marrow suppression, the possibility of GI upset, skin ulcers, hair thinning, and teratogenicity. The patient was reminded of the need to seek medical attention of any symptoms of bleeding, anemia, or infection. Continue folic acid 1 mg daily to prevent  aplastic bone marrow crises.   2. Pulmonary evaluation - Patient denies severe recurrent wheezes, shortness of breath with exercise, or persistent cough. If these symptoms develop, pulmonary function tests with spirometry will be ordered, and if abnormal, plan on referral to Pulmonology for further evaluation.  3. Cardiac - Routine screening for pulmonary hypertension is not recommended.  4. Eye - High risk of proliferative retinopathy. Annual eye exam with retinal exam recommended to patient.  5. Immunization status -  Yearly influenza vaccination is recommended, as well as being up to date with Meningococcal and Pneumococcal vaccines.   6. Acute and chronic painful episodes - We discussed that pt is to receive Schedule II prescriptions only from [place]. Pt is also aware that the prescription history is available to [place] online through the [facility] CSRS. Controlled substance agreement signed. We reminded Cheri Ayotte that all patients receiving Schedule II narcotics must be seen for follow within one month of prescription being requested. We reviewed the terms of our pain agreement, including the need to keep medicines in a safe locked location away from children or pets, and the need to report excess sedation or constipation, measures to avoid constipation, and policies related to early refills and stolen prescriptions. According to the [place] Chronic Pain Initiative program, we have reviewed details related to analgesia, adverse effects, aberrant behaviors.  7. Iron overload from chronic transfusion.  Not applicable at this time.  If this occurs will use Exjade for management.   8. Vitamin D deficiency - Drisdol 50,000 units weekly. Patient encouraged to take as prescribed.   The above recommendations are taken from the NIH Evidence-Based Management of Sickle Cell Disease: Expert Panel Report, 20149.   [name], FNP-BC Patient [facility] [address] [place], [place] [address] [phone]  This note has been created with Dragon speech recognition software and smart phrase technology. Any transcriptional errors are unintentional.

## 2018-03-03 LAB — CBC WITH DIFFERENTIAL
Basophils Absolute: 0.2 10*3/uL (ref 0.0–0.2)
Basos: 1 %
EOS (ABSOLUTE): 0.2 10*3/uL (ref 0.0–0.4)
Eos: 1 %
Hematocrit: 23.1 % — ABNORMAL LOW (ref 34.0–46.6)
Hemoglobin: 7.7 g/dL — ABNORMAL LOW (ref 11.1–15.9)
Immature Grans (Abs): 0.1 10*3/uL (ref 0.0–0.1)
Immature Granulocytes: 1 %
Lymphocytes Absolute: 3.2 10*3/uL — ABNORMAL HIGH (ref 0.7–3.1)
Lymphs: 20 %
MCH: 29.8 pg (ref 26.6–33.0)
MCHC: 33.3 g/dL (ref 31.5–35.7)
MCV: 90 fL (ref 79–97)
Monocytes Absolute: 2.2 10*3/uL — ABNORMAL HIGH (ref 0.1–0.9)
Monocytes: 14 %
NRBC: 4 % — ABNORMAL HIGH (ref 0–0)
Neutrophils Absolute: 9.7 10*3/uL — ABNORMAL HIGH (ref 1.4–7.0)
Neutrophils: 63 %
RBC: 2.58 x10E6/uL — CL (ref 3.77–5.28)
RDW: 18 % — ABNORMAL HIGH (ref 12.3–15.4)
WBC: 15.5 10*3/uL — ABNORMAL HIGH (ref 3.4–10.8)

## 2018-03-03 LAB — COMPREHENSIVE METABOLIC PANEL
ALT: 10 IU/L (ref 0–32)
AST: 21 IU/L (ref 0–40)
Albumin/Globulin Ratio: 1.6 (ref 1.2–2.2)
Albumin: 4.7 g/dL (ref 3.5–5.5)
Alkaline Phosphatase: 53 IU/L (ref 39–117)
BUN/Creatinine Ratio: 13 (ref 9–23)
BUN: 7 mg/dL (ref 6–24)
Bilirubin Total: 4 mg/dL — ABNORMAL HIGH (ref 0.0–1.2)
CO2: 22 mmol/L (ref 20–29)
Calcium: 9.8 mg/dL (ref 8.7–10.2)
Chloride: 102 mmol/L (ref 96–106)
Creatinine, Ser: 0.54 mg/dL — ABNORMAL LOW (ref 0.57–1.00)
GFR calc Af Amer: 131 mL/min/{1.73_m2} (ref >59)
GFR calc non Af Amer: 114 mL/min/{1.73_m2} (ref >59)
Globulin, Total: 3 g/dL (ref 1.5–4.5)
Glucose: 95 mg/dL (ref 65–99)
Potassium: 4.1 mmol/L (ref 3.5–5.2)
Sodium: 145 mmol/L — ABNORMAL HIGH (ref 134–144)
Total Protein: 7.7 g/dL (ref 6.0–8.5)

## 2018-03-03 LAB — FERRITIN: Ferritin: 884 ng/mL — ABNORMAL HIGH (ref 15–150)

## 2018-03-03 LAB — FOLATE: Folate: 9.5 ng/mL (ref >3.0)

## 2018-03-03 LAB — VITAMIN D 25 HYDROXY (VIT D DEFICIENCY, FRACTURES): Vit D, 25-Hydroxy: 20.4 ng/mL — ABNORMAL LOW (ref 30.0–100.0)

## 2018-03-04 ENCOUNTER — Encounter: Payer: Self-pay | Admitting: Family Medicine

## 2018-03-04 MED ORDER — VITAMIN D (ERGOCALCIFEROL) 1.25 MG (50000 UNIT) PO CAPS: 50000.0000 [IU] | ORAL_CAPSULE | ORAL | 2 refills | Status: AC

## 2018-03-04 NOTE — Addendum Note (Signed)
Addended by: Genelle Bal on: 03/04/2018 01:52 PM   Modules accepted: Orders

## 2018-03-09 ENCOUNTER — Encounter (INDEPENDENT_AMBULATORY_CARE_PROVIDER_SITE_OTHER): Payer: Self-pay | Admitting: Orthopaedic Surgery

## 2018-03-09 ENCOUNTER — Ambulatory Visit (INDEPENDENT_AMBULATORY_CARE_PROVIDER_SITE_OTHER): Payer: Medicaid Other | Admitting: Orthopaedic Surgery

## 2018-03-09 ENCOUNTER — Ambulatory Visit (INDEPENDENT_AMBULATORY_CARE_PROVIDER_SITE_OTHER): Payer: Medicaid Other

## 2018-03-09 DIAGNOSIS — M20012 Mallet finger of left finger(s): Secondary | ICD-10-CM

## 2018-03-09 NOTE — Progress Notes (Signed)
The patient has a history of a known left fifth finger bony mallet deformity.  She is been in a splint.  Injury occurred several weeks before she was sent to [place].  We have her in an extended position of that finger.  She does still report pain at the DIP joint is where she points to.  This is her nondominant side.  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  On exam she still has mild deformity but I do not think she is been adherent to wearing her splint.  X-rays were obtained today not in the splint showing the same positioning of the DIP joint with a slightly subluxed position and obvious bony mallet deformity.  I stressed the importance of compliance with the splint and keeping it extended throughout the day for the next 6 weeks.  Like to see her back in 4 weeks with a repeat 2 views of her left fifth finger but in her splint.

## 2018-03-27 ENCOUNTER — Other Ambulatory Visit: Payer: Self-pay | Admitting: Internal Medicine

## 2018-03-27 ENCOUNTER — Telehealth: Payer: Self-pay

## 2018-03-27 DIAGNOSIS — D571 Sickle-cell disease 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: Secondary | ICD-10-CM

## 2018-03-27 MED ORDER — OXYCODONE HCL 10 MG PO TABS: 10.0000 mg | ORAL_TABLET | ORAL | 0 refills | Status: DC | PRN

## 2018-03-27 NOTE — Telephone Encounter (Signed)
Refilled

## 2018-04-06 ENCOUNTER — Encounter (INDEPENDENT_AMBULATORY_CARE_PROVIDER_SITE_OTHER): Payer: Self-pay | Admitting: Orthopaedic Surgery

## 2018-04-06 ENCOUNTER — Ambulatory Visit (INDEPENDENT_AMBULATORY_CARE_PROVIDER_SITE_OTHER): Payer: Medicaid Other | Admitting: Family Medicine

## 2018-04-06 ENCOUNTER — Ambulatory Visit (INDEPENDENT_AMBULATORY_CARE_PROVIDER_SITE_OTHER): Payer: Medicaid Other

## 2018-04-06 ENCOUNTER — Ambulatory Visit (INDEPENDENT_AMBULATORY_CARE_PROVIDER_SITE_OTHER): Payer: Medicaid Other | Admitting: Orthopaedic Surgery

## 2018-04-06 VITALS — BP 132/66 | HR 68 | Temp 98.0°F | Ht 67.0 in | Wt 130.2 lb

## 2018-04-06 DIAGNOSIS — M20012 Mallet finger of left finger(s): Secondary | ICD-10-CM

## 2018-04-06 DIAGNOSIS — G894 Chronic pain syndrome: Secondary | ICD-10-CM | POA: Diagnosis not present

## 2018-04-06 DIAGNOSIS — D571 Sickle-cell disease 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: Secondary | ICD-10-CM

## 2018-04-06 LAB — POCT URINALYSIS DIP (CLINITEK)
Bilirubin, UA: NEGATIVE
Glucose, UA: NEGATIVE mg/dL
Ketones, POC UA: NEGATIVE mg/dL
Leukocytes, UA: NEGATIVE
Nitrite, UA: NEGATIVE
POC PROTEIN,UA: 30 — AB
Spec Grav, UA: 1.015 (ref 1.010–1.025)
Urobilinogen, UA: 1 E.U./dL
pH, UA: 6 (ref 5.0–8.0)

## 2018-04-06 NOTE — Patient Instructions (Signed)
Sickle Cell Anemia, Adult °Sickle cell anemia is a condition where your red blood cells are shaped like sickles. Red blood cells carry oxygen through the body. Sickle-shaped red blood cells do not live as long as normal red blood cells. They also clump together and block blood from flowing through the blood vessels. These things prevent the body from getting enough oxygen. Sickle cell anemia causes organ damage and pain. It also increases the risk of infection. °Follow these instructions at home: °· Drink enough fluid to keep your pee (urine) clear or pale yellow. Drink more in hot weather and during exercise. °· Do not smoke. Smoking lowers oxygen levels in the blood. °· Only take over-the-counter or prescription medicines as told by your doctor. °· Take antibiotic medicines as told by your doctor. Make sure you finish them even if you start to feel better. °· Take supplements as told by your doctor. °· Consider wearing a medical alert bracelet. This tells anyone caring for you in an emergency of your condition. °· When traveling, keep your medical information, doctors' names, and the medicines you take with you at all times. °· If you have a fever, do not take fever medicines right away. This could cover up a problem. Tell your doctor. °· Keep all follow-up visits with your doctor. Sickle cell anemia requires regular medical care. °Contact a doctor if: °You have a fever. °Get help right away if: °· You feel dizzy or faint. °· You have new belly (abdominal) pain, especially on the left side near the stomach area. °· You have a lasting, often uncomfortable and painful erection of the penis (priapism). If it is not treated right away, you will become unable to have sex (impotence). °· You have numbness in your arms or legs or you have a hard time moving them. °· You have a hard time talking. °· You have a fever or lasting symptoms for more than 2-3 days. °· You have a fever and your symptoms suddenly get  worse. °· You have signs or symptoms of infection. These include: °? Chills. °? Being more tired than normal (lethargy). °? Irritability. °? Poor eating. °? Throwing up (vomiting). °· You have pain that is not helped with medicine. °· You have shortness of breath. °· You have pain in your chest. °· You are coughing up pus-like or bloody mucus. °· You have a stiff neck. °· Your feet or hands swell or have pain. °· Your belly looks bloated. °· Your joints hurt. °This information is not intended to replace advice given to you by your health care provider. Make sure you discuss any questions you have with your health care provider. °Document Released: 02/24/2013 Document Revised: 10/12/2015 Document Reviewed: 12/16/2012 °Elsevier Interactive Patient Education © 2017 Elsevier Inc. ° °

## 2018-04-06 NOTE — Progress Notes (Signed)
PATIENT CARE CENTER INTERNAL MEDICINE AND SICKLE CELL CARE  SICKLE CELL ANEMIA FOLLOW UP VISIT PROVIDER: [name], FNP    Subjective:   [name]  is a 47 y.o.  female who  has a past medical history of Anemia (03/30/2012), CAP (community acquired pneumonia), Cholelithiasis (03/30/2012), Chronic wound of extremity (04/01/2012), Elevated LFTs, Leg ulcer ([place]) (10/27/2012), Multiple open wounds of lower extremity, Sickle cell disease ([place]), and Sinus bradycardia by electrocardiogram (04/03/2012). presents for a follow up for Sickle Cell Anemia. She has had 1 admission in the past 6 months.  Pain regimen includes: Ibuprofen and oxycodone Hydrea Therapy: No Medication compliance: Yes  Pain today is 6/10 today. States that she does not feel well and her pain is mostly in her arms.  The patient reports adequate daily hydration.    Review of Systems  Constitutional: Positive for malaise/fatigue.  HENT: Negative.   Eyes: Negative.   Respiratory: Negative.   Cardiovascular: Negative.   Gastrointestinal: Negative.   Genitourinary: Negative.   Musculoskeletal: Positive for joint pain and myalgias. Negative for falls.  Skin: Negative.        Wound to the left lower extremity. Followed by Wound care   Neurological: Negative.   Psychiatric/Behavioral: Negative.     Objective:   Objective  BP 132/66 (BP Location: Right Arm, Patient Position: Sitting, Cuff Size: Small)   Pulse 68   Temp 98 F (36.7 C) (Oral)   Ht 5\' 7"  (1.702 m)   Wt 130 lb 3.2 oz (59.1 kg)   LMP 03/27/2018   SpO2 100%   BMI 20.39 kg/m    Physical Exam  Constitutional: She is oriented to person, place, and time. She appears well-developed and well-nourished. No distress.  HENT:  Head: Normocephalic and atraumatic.  Eyes: Pupils are equal, round, and reactive to light. Conjunctivae and EOM are normal.  Neck: Normal range of motion.  Cardiovascular: Normal rate, regular rhythm and intact distal  pulses.  Murmur heard. Pulmonary/Chest: Effort normal and breath sounds normal. No respiratory distress.  Abdominal: Soft. Bowel sounds are normal. She exhibits no distension.  Musculoskeletal: Normal range of motion. She exhibits tenderness (generalized. ).  Neurological: She is alert and oriented to person, place, and time.  Skin: Skin is warm and dry.  Left lower extremity with gauze bandage noted.  Psychiatric: She has a normal mood and affect. Her behavior is normal. Judgment and thought content normal.  Nursing note and vitals reviewed.    Assessment/Plan:   Assessment   Encounter Diagnoses  Name Primary?  . Chronic pain syndrome   . Hb-SS disease 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 ([place]) Yes     Plan    Chronic pain syndrome Continue with current medications - CBC with Differential - Comprehensive metabolic panel - POCT URINALYSIS DIP ([facility])  Hb-SS disease 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 ([place]) Continue with current medications.  - CBC with Differential - Comprehensive metabolic panel - POCT URINALYSIS DIP ([facility]) - POCT urinalysis dipstick  Patient advised to go to the day hospital for pain management and hydration. She states that she cannot go today due to having her son with her. She will return in the morning for further evaluation and management. Return to care as scheduled and prn. Patient verbalized understanding and agreed with plan of care.   1. Sickle cell disease -  We discussed the need for good hydration, monitoring of hydration status, avoidance of heat, cold, stress, and infection triggers. We discussed the risks and benefits of Hydrea, including bone marrow suppression, the  possibility of GI upset, skin ulcers, hair thinning, and teratogenicity. The patient was reminded of the need to seek medical attention of any symptoms of bleeding, anemia, or infection. Continue folic acid 1 mg daily to prevent aplastic bone marrow crises.   2. Pulmonary evaluation - Patient denies severe recurrent  wheezes, shortness of breath with exercise, or persistent cough. If these symptoms develop, pulmonary function tests with spirometry will be ordered, and if abnormal, plan on referral to Pulmonology for further evaluation.  3. Cardiac - Routine screening for pulmonary hypertension is not recommended.  4. Eye - High risk of proliferative retinopathy. Annual eye exam with retinal exam recommended to patient.  5. Immunization status -  Yearly influenza vaccination is recommended, as well as being up to date with Meningococcal and Pneumococcal vaccines.   6. Acute and chronic painful episodes - We discussed that pt is to receive Schedule II prescriptions only from [place]. Pt is also aware that the prescription history is available to [place] online through the [facility] CSRS. Controlled substance agreement signed We reminded Kayleanna Lorman that all patients receiving Schedule II narcotics must be seen for follow within one month of prescription being requested. We reviewed the terms of our pain agreement, including the need to keep medicines in a safe locked location away from children or pets, and the need to report excess sedation or constipation, measures to avoid constipation, and policies related to early refills and stolen prescriptions. According to the [place] Chronic Pain Initiative program, we have reviewed details related to analgesia, adverse effects, aberrant behaviors.  7. Iron overload from chronic transfusion.  Not applicable at this time.  If this occurs will use Exjade for management.   8. Vitamin D deficiency - Drisdol 50,000 units weekly. Patient encouraged to take as prescribed.   The above recommendations are taken from the NIH Evidence-Based Management of Sickle Cell Disease: Expert Panel Report, 20149.   [name], FNP-BC Patient [facility] [address] [place], [place] [address] [phone]  This note has been created with Dragon speech recognition  software and smart phrase technology. Any transcriptional errors are unintentional.

## 2018-04-06 NOTE — Progress Notes (Signed)
The patient is well-known to [name].  She has a mallet deformity which is a bony mallet of her left finger.  We have had her in a splint.  She denies any pain at this point.  We had a splint for several weeks now.  On exam her finger is not as deformed as it was at the DIP joint on the left fifth finger and the swelling is minimal.  There is no pain.  X-rays still continues to the mallet deformity but improved alignment overall.  At this point I will have her wear the splint for the first year.  She can come out of it after then.  All questions were answered and addressed.  She would rather follow-up as needed and I agree with this as I do not think she can be functional deficit for her at all.

## 2018-04-07 ENCOUNTER — Encounter (HOSPITAL_COMMUNITY): Payer: Self-pay | Admitting: General Practice

## 2018-04-07 ENCOUNTER — Non-Acute Institutional Stay (HOSPITAL_COMMUNITY)
Admission: AD | Admit: 2018-04-07 | Discharge: 2018-04-07 | Disposition: A | Discharge status: Home / Self Care | Payer: Medicaid Other | Source: Ambulatory Visit | Attending: Internal Medicine | Admitting: Internal Medicine

## 2018-04-07 DIAGNOSIS — Z791 Long term (current) use of non-steroidal anti-inflammatories (NSAID): Secondary | ICD-10-CM | POA: Diagnosis not present

## 2018-04-07 DIAGNOSIS — D5700 Hb-SS disease with crisis, unspecified: Secondary | ICD-10-CM | POA: Insufficient documentation

## 2018-04-07 DIAGNOSIS — Z79899 Other long term (current) drug therapy: Secondary | ICD-10-CM | POA: Insufficient documentation

## 2018-04-07 DIAGNOSIS — R52 Pain, unspecified: Secondary | ICD-10-CM | POA: Diagnosis present

## 2018-04-07 LAB — CBC WITH DIFFERENTIAL/PLATELET
Basophils Absolute: 0.2 10*3/uL (ref 0.0–0.2)
Basos: 1 %
EOS (ABSOLUTE): 0.2 10*3/uL (ref 0.0–0.4)
Eos: 1 %
Hematocrit: 19.9 % — ABNORMAL LOW (ref 34.0–46.6)
Hemoglobin: 6.7 g/dL — CL (ref 11.1–15.9)
Immature Grans (Abs): 0.2 10*3/uL — ABNORMAL HIGH (ref 0.0–0.1)
Immature Granulocytes: 1 %
Lymphocytes Absolute: 3.1 10*3/uL (ref 0.7–3.1)
Lymphs: 20 %
MCH: 29.8 pg (ref 26.6–33.0)
MCHC: 33.7 g/dL (ref 31.5–35.7)
MCV: 88 fL (ref 79–97)
Monocytes Absolute: 2.3 10*3/uL — ABNORMAL HIGH (ref 0.1–0.9)
Monocytes: 14 %
NRBC: 14 % — ABNORMAL HIGH (ref 0–0)
Neutrophils Absolute: 10.1 10*3/uL — ABNORMAL HIGH (ref 1.4–7.0)
Neutrophils: 63 %
Platelets: 444 10*3/uL (ref 150–450)
RBC: 2.25 x10E6/uL — CL (ref 3.77–5.28)
RDW: 22 % — ABNORMAL HIGH (ref 12.3–15.4)
WBC: 16.1 10*3/uL — ABNORMAL HIGH (ref 3.4–10.8)

## 2018-04-07 LAB — URINALYSIS, ROUTINE W REFLEX MICROSCOPIC
BILIRUBIN URINE: NEGATIVE
Bacteria, UA: NONE SEEN
Glucose, UA: NEGATIVE mg/dL
KETONES UR: NEGATIVE mg/dL
LEUKOCYTES UA: NEGATIVE
Nitrite: NEGATIVE
PH: 5 (ref 5.0–8.0)
Protein, ur: 30 mg/dL — AB
Specific Gravity, Urine: 1.012 (ref 1.005–1.030)

## 2018-04-07 LAB — CBC
HCT: 19.4 % — ABNORMAL LOW (ref 36.0–46.0)
Hemoglobin: 6.7 g/dL — CL (ref 12.0–15.0)
MCH: 30.7 pg (ref 26.0–34.0)
MCHC: 34.5 g/dL (ref 30.0–36.0)
MCV: 89 fL (ref 80.0–100.0)
PLATELETS: 400 10*3/uL (ref 150–400)
RBC: 2.18 MIL/uL — ABNORMAL LOW (ref 3.87–5.11)
RDW: 25.4 % — ABNORMAL HIGH (ref 11.5–15.5)
WBC: 14.5 10*3/uL — ABNORMAL HIGH (ref 4.0–10.5)
nRBC: 17.3 % — ABNORMAL HIGH (ref 0.0–0.2)

## 2018-04-07 LAB — COMPREHENSIVE METABOLIC PANEL
ALT: 15 IU/L (ref 0–32)
AST: 26 IU/L (ref 0–40)
Albumin/Globulin Ratio: 1.6 (ref 1.2–2.2)
Albumin: 4.4 g/dL (ref 3.5–5.5)
Alkaline Phosphatase: 57 IU/L (ref 39–117)
BUN/Creatinine Ratio: 16 (ref 9–23)
BUN: 8 mg/dL (ref 6–24)
Bilirubin Total: 4.6 mg/dL — ABNORMAL HIGH (ref 0.0–1.2)
CO2: 20 mmol/L (ref 20–29)
Calcium: 9.4 mg/dL (ref 8.7–10.2)
Chloride: 105 mmol/L (ref 96–106)
Creatinine, Ser: 0.49 mg/dL — ABNORMAL LOW (ref 0.57–1.00)
GFR calc Af Amer: 135 mL/min/{1.73_m2} (ref >59)
GFR calc non Af Amer: 117 mL/min/{1.73_m2} (ref >59)
Globulin, Total: 2.8 g/dL (ref 1.5–4.5)
Glucose: 81 mg/dL (ref 65–99)
Potassium: 4.1 mmol/L (ref 3.5–5.2)
Sodium: 141 mmol/L (ref 134–144)
Total Protein: 7.2 g/dL (ref 6.0–8.5)

## 2018-04-07 LAB — PREPARE RBC (CROSSMATCH)

## 2018-04-07 LAB — HEMOGLOBIN AND HEMATOCRIT, BLOOD
HCT: 23.4 % — ABNORMAL LOW (ref 36.0–46.0)
HEMATOCRIT: 18.6 % — AB (ref 36.0–46.0)
HEMOGLOBIN: 6.3 g/dL — AB (ref 12.0–15.0)
Hemoglobin: 8.2 g/dL — ABNORMAL LOW (ref 12.0–15.0)

## 2018-04-07 LAB — LACTATE DEHYDROGENASE: LDH: 336 U/L — ABNORMAL HIGH (ref 98–192)

## 2018-04-07 LAB — RETICULOCYTES
IMMATURE RETIC FRACT: 39.9 % — AB (ref 2.3–15.9)
RBC.: 2.18 MIL/uL — ABNORMAL LOW (ref 3.87–5.11)
Retic Count, Absolute: 486 10*3/uL — ABNORMAL HIGH (ref 19.0–186.0)
Retic Ct Pct: 22.3 % — ABNORMAL HIGH (ref 0.4–3.1)

## 2018-04-07 LAB — HCG, SERUM, QUALITATIVE: PREG SERUM: NEGATIVE

## 2018-04-07 MED ORDER — SODIUM CHLORIDE 0.9% FLUSH: 9.0000 mL | INTRAVENOUS | Status: DC | PRN

## 2018-04-07 MED ORDER — L-GLUTAMINE ORAL POWDER: 5.0000 g | PACK | Freq: Two times a day (BID) | ORAL | 1 refills | Status: DC

## 2018-04-07 MED ORDER — SODIUM CHLORIDE 0.45 % IV SOLN
INTRAVENOUS | Status: DC
  Administered 2018-04-07: 10:00:00 via INTRAVENOUS

## 2018-04-07 MED ORDER — ONDANSETRON HCL 4 MG/2ML IJ SOLN: 4.0000 mg | Freq: Four times a day (QID) | INTRAMUSCULAR | Status: DC | PRN

## 2018-04-07 MED ORDER — NALOXONE HCL 0.4 MG/ML IJ SOLN: 0.4000 mg | INTRAMUSCULAR | Status: DC | PRN

## 2018-04-07 MED ORDER — KETOROLAC TROMETHAMINE 15 MG/ML IJ SOLN
15.0000 mg | Freq: Four times a day (QID) | INTRAMUSCULAR | Status: DC
  Administered 2018-04-07: 15 mg via INTRAVENOUS
  Filled 2018-04-07: qty 1

## 2018-04-07 MED ORDER — SODIUM CHLORIDE 0.9% IV SOLUTION
Freq: Once | INTRAVENOUS | Status: AC
  Administered 2018-04-07: 12:00:00 via INTRAVENOUS

## 2018-04-07 MED ORDER — DIPHENHYDRAMINE HCL 25 MG PO CAPS
25.0000 mg | ORAL_CAPSULE | ORAL | Status: DC | PRN
  Administered 2018-04-07: 25 mg via ORAL
  Filled 2018-04-07: qty 1

## 2018-04-07 MED ORDER — HYDROMORPHONE 1 MG/ML IV SOLN
INTRAVENOUS | Status: DC
  Administered 2018-04-07: 12.5 mg via INTRAVENOUS
  Administered 2018-04-07: 30 mg via INTRAVENOUS
  Filled 2018-04-07: qty 30

## 2018-04-07 NOTE — Discharge Summary (Signed)
[facility] Discharge Summary   Patient ID: [name] MRN: [id number] DOB/AGE: [birth year] 47 y.o.  Admit date: 04/07/2018 Discharge date: 04/07/2018  Primary Care Physician:  [name], [place]  Admission Diagnoses:  Active Problems:   Sickle cell pain crisis [facility])  Discharge Medications:  Allergies as of 04/07/2018   No Known Allergies     Medication List    TAKE these medications   Clobetasol Prop Emollient Base 0.05 % emollient cream Apply 1 application topically 2 (two) times daily.   fluticasone 50 MCG/ACT nasal spray Commonly known as:  FLONASE Place 2 sprays into both nostrils daily.   folic acid 1 MG tablet Commonly known as:  FOLVITE Take 5 tablets (5 mg total) by mouth daily.   ibuprofen 600 MG tablet Commonly known as:  ADVIL,MOTRIN Take 1 tablet (600 mg total) by mouth every 6 (six) hours.   L-glutamine 5 g Pack Powder Packet Commonly known as:  ENDARI Take 5 g by mouth 2 (two) times daily.   Oxycodone HCl 10 MG Tabs Take 1 tablet (10 mg total) by mouth every 4 (four) hours as needed for up to 15 days (pain).   Vitamin D (Ergocalciferol) 1.25 MG (50000 UT) Caps capsule Commonly known as:  DRISDOL Take 1 capsule (50,000 Units total) by mouth every 7 (seven) days.   Vitamin D 50 MCG (2000 UT) Caps Take 2 capsules (4,000 Units total) by mouth daily.        Consults:None  Significant Diagnostic Studies:  Xr Finger Little Left  Result Date: 04/06/2018 3 views of the left fifth finger show a bony mallet deformity with slight subluxation at the DIP joint.  The alignment is improved since previous films.  Xr Finger Little Left  Result Date: 03/09/2018 2 views of the left fifth finger show a bony mallet deformity with slight subluxation of the joint.    [facility] Course: [name], a 47 year old female with a history of sickle cell anemia, HbSS, chronic pain syndrome, chronic leg ulcers, and  avascular necrosis was admitted to sickle cell day hospital for pain management and extended observation.   Reviewed labs: Hemoglobin 6.7, which was decreased from baseline. Patient transfused 1 unit of packed red blood cells.  Repeated post transfusion H&H, hemoglobin was 6.3. Repeat H&H pending.   WBCs 14.5. No signs of infectious process. Patient afebrile.   Hypotonic IV fluids initiated at 75 ml/hour Toradol 15 mg IV times one Patient opiate tolerant. Dilaudid PCA per weight based protocol.  Pain intensity decreased to 3.10.  Patient alert, oriented, and ambulating. Will discharge home in stable condition.  Patient given strict return precautions.   Discharge instructions:  Resume all home medications Patient scheduled for repeat CBC on 04/09/2018 Avoid all stressors that precipitate sickle cell crisis.  Discussed restarting Endari at length. The findings showed a 25% reduction in the frequency of sickle-cell crises, a 33% decrease in hospitalisation rates, fewer hospital visits due to sickle-cell pain and 60% fewer occurrences of acute chest syndrome in patients administered with Endari.     The patient was given clear instructions to go to ER or return to medical center if symptoms do not improve, worsen or new problems develop. The patient verbalized understanding.     Physical Exam at Discharge:  BP 132/74   Pulse (!) 55   Temp (!) 97.4 F (36.3 C) (Oral)   Resp 16   LMP 03/27/2018   SpO2 98%  Physical Exam  Constitutional: She is oriented to person, place, and time. She appears well-developed and well-nourished.  HENT:  Head: Normocephalic.  Eyes: Pupils are equal, round, and reactive to light.  Neck: Normal range of motion.  Cardiovascular: Normal rate, regular rhythm and normal heart sounds.  Pulmonary/Chest: Effort normal and breath sounds normal.  Abdominal: Soft. Bowel sounds are normal.  Musculoskeletal: Normal range of motion.  Neurological: She is alert  and oriented to person, place, and time.  Skin: Skin is warm and dry.  Psychiatric: She has a normal mood and affect. Her behavior is normal. Judgment and thought content normal.     Disposition at Discharge: Discharge disposition: 01-Home or Self Care       Discharge Orders: Discharge Instructions    Discharge patient   Complete by:  As directed    Discharge disposition:  01-Home or Self Care   Discharge patient date:  04/07/2018      Condition at Discharge:   Stable  Time spent on Discharge:  20 minutes Signed: [name]  APRN, MSN, FNP-C [facility] [address] [place], [place] [address] [phone] 04/07/2018, 3:33 PM

## 2018-04-07 NOTE — Progress Notes (Signed)
Patient admitted to day infusion hospital for sickle cell pain crisis. Initially, patient reported pain in bilateral legs and left arm rated 8/10. For pain management, patient placed on Dilaudid PCA, given 15 mg Toradol and hydrated with IV fluids. Labs drawn and hemoglobin 6.7. One (1) unit of PRBC's given. Patient tolerated transfusion well. At discharge, patient's pain level 3/10. Hemoglobin 8.2. Vital signs stable. Discharge instructions given. Patient alert,oriented and ambulatory at discharge.

## 2018-04-07 NOTE — H&P (Signed)
[facility] History and Physical   Date: 04/07/2018  Patient name: [name] Medical record number: [id number] Date of birth: 1970-08-28 Age: 47 y.o. Gender: female PCP: [name], [place]  Attending physician: Tresa Garter, MD  Chief Complaint: Generalized pain  History of Present Illness: [name], a 47 year old female with a medical history significant for sickle cell anemia, HbSS, chronic pain syndrome, chronic lower leg ulcers, hemochromatosis, avascular necrosis, and anemia of chronic disease presents complaining of upper and lower extremity pain that is consistent with previous sickle cell crisis. Patient says that pain intensity increased several days ago. She attributes current pain crisis to changes in weather. She has been taking Oxycodone and Ibuprofen consistently without sustained relief. She last had oxycodone around 7 am. Current pain intensity is 8/10 characterized as intermittent and sharp. Patient also endorses fatigue. She denies shortness of breath, headache, dizziness, blurred vision, dysuria, nausea, vomiting, or diarrhea.  Patient admitted to sickle cell day hospital for further pain management.   Meds: Medications Prior to Admission  Medication Sig Dispense Refill Last Dose  . Cholecalciferol (VITAMIN D) 2000 units CAPS Take 2 capsules (4,000 Units total) by mouth daily. 30 capsule 11 Taking  . Clobetasol Prop Emollient Base (CLOBETASOL PROPIONATE E) 0.05 % emollient cream Apply 1 application topically 2 (two) times daily. (Patient not taking: Reported on 05/14/2017) 30 g 1 Not Taking  . fluticasone (FLONASE) 50 MCG/ACT nasal spray Place 2 sprays into both nostrils daily. (Patient not taking: Reported on 02/02/2018) 16 g 6 Not Taking  . folic acid (FOLVITE) 1 MG tablet Take 5 tablets (5 mg total) by mouth daily. 150 tablet 11 Taking  . ibuprofen (ADVIL,MOTRIN) 600 MG tablet Take 1 tablet (600 mg total) by mouth every 6 (six) hours. 60  tablet 2 Taking  . Oxycodone HCl 10 MG TABS Take 1 tablet (10 mg total) by mouth every 4 (four) hours as needed for up to 15 days (pain). 90 tablet 0 Taking  . Vitamin D, Ergocalciferol, (DRISDOL) 50000 units CAPS capsule Take 1 capsule (50,000 Units total) by mouth every 7 (seven) days. 8 capsule 2 Taking    Allergies: Patient has no known allergies. Past Medical History:  Diagnosis Date  . Anemia 03/30/2012   Hx of sickle cell disease  . CAP (community acquired pneumonia)    2014  . Cholelithiasis 03/30/2012  . Chronic wound of extremity 04/01/2012  . Elevated LFTs   . Leg ulcer ([place]) 10/27/2012   Chronic under care of wound clinic  . Multiple open wounds of lower extremity    chronic wounds B/LLE  . Sickle cell disease ([place])   . Sinus bradycardia by electrocardiogram 04/03/2012   Past Surgical History:  Procedure Laterality Date  . ALLOGRAFT APPLICATION Right 01/11/2352   Procedure: SURGICAL PREP FOR GRAFTING RIGHT LOWER EXTREMITY AND APPLICATION OF Jannifer Hick;  Surgeon: Irene Limbo, MD;  Location: [facility];  Service: Plastics;  Laterality: Right;  . APPLICATION OF A-CELL OF EXTREMITY Right 10/09/2015   Procedure: APPLICATION OF Jannifer Hick;  Surgeon: Irene Limbo, MD;  Location: [facility];  Service: Plastics;  Laterality: Right;  . BREAST SURGERY     left breast cyst aspiration  . CESAREAN SECTION    . CESAREAN SECTION N/A 01/06/2014   Procedure: CESAREAN SECTION;  Surgeon: Emily Filbert, MD;  Location: [facility];  Service: Obstetrics;  Laterality: N/A;  . CESAREAN SECTION N/A 05/04/2017   Procedure: CESAREAN SECTION;  Surgeon: Roselie Awkward,  Alvina Filbert, MD;  Location: [facility];  Service: Obstetrics;  Laterality: N/A;  . I&D EXTREMITY Right 10/09/2015   Procedure: SURGICAL PREPARATION FOR GRAFTING RIGHT ANKLE AND APPLICATION THERASKIN;  Surgeon: Irene Limbo, MD;  Location: [facility];  Service: Plastics;  Laterality: Right;  .  SKIN FULL THICKNESS GRAFT Bilateral 06/17/2016   Procedure: SURGICAL PREP FOR GRAFTING, BILATERAL LOWER EXTREMITIES AND APPLICATION OF Jannifer Hick;  Surgeon: Irene Limbo, MD;  Location: [facility];  Service: Plastics;  Laterality: Bilateral;  . TONSILLECTOMY     Family History  Problem Relation Age of Onset  . Sickle cell anemia Sister   . Diabetes Mother   . Asthma Mother   . Sickle cell trait Mother   . Sickle cell trait Father   . Hypertension Father    Social History   Socioeconomic History  . Marital status: Married    Spouse name: [name]  . Number of children: 1  . Years of education: Not on file  . Highest education level: Not on file  Occupational History  . Occupation: Employed in home care.  Social Needs  . Financial resource strain: Not on file  . Food insecurity:    Worry: Not on file    Inability: Not on file  . Transportation needs:    Medical: Not on file    Non-medical: Not on file  Tobacco Use  . Smoking status: Never Smoker  . Smokeless tobacco: Never Used  Substance and Sexual Activity  . Alcohol use: No  . Drug use: No  . Sexual activity: Yes    Birth control/protection: None  Lifestyle  . Physical activity:    Days per week: Not on file    Minutes per session: Not on file  . Stress: Not on file  Relationships  . Social connections:    Talks on phone: Not on file    Gets together: Not on file    Attends religious service: Not on file    Active member of club or organization: Not on file    Attends meetings of clubs or organizations: Not on file    Relationship status: Not on file  . Intimate partner violence:    Fear of current or ex partner: Not on file    Emotionally abused: Not on file    Physically abused: Not on file    Forced sexual activity: Not on file  Other Topics Concern  . Not on file  Social History Narrative   Lives with husband.   Review of Systems  Constitutional: Negative.   HENT: Negative.   Eyes:  Negative.   Respiratory: Negative.   Cardiovascular: Negative for chest pain.  Gastrointestinal: Negative.   Genitourinary: Negative.   Musculoskeletal: Positive for myalgias (upper and lower extremities).  Skin: Negative.   Neurological: Negative.   Endo/Heme/Allergies: Negative.   Psychiatric/Behavioral: Negative.     Physical Exam: Blood pressure 133/75, pulse 74, temperature 98.4 F (36.9 C), temperature source Oral, resp. rate 16, last menstrual period 03/27/2018, SpO2 93 %, unknown if currently breastfeeding. Physical Exam  Constitutional: She is oriented to person, place, and time. She appears well-developed and well-nourished. She has a sickly appearance. No distress.  Eyes: Scleral icterus is present.  Cardiovascular: Normal rate, regular rhythm and normal heart sounds.  Pulmonary/Chest: Effort normal and breath sounds normal.  Abdominal: Soft. Bowel sounds are normal.  Neurological: She is alert and oriented to person, place, and time.  Skin: Skin is warm and dry.  Lower extremity leg ulcers, bandages clean, dry and intact  Psychiatric: She has a normal mood and affect. Her behavior is normal. Judgment and thought content normal.     Lab results: Results for orders placed or performed in visit on 04/06/18 (from the past 24 hour(s))  CBC with Differential     Status: Abnormal   Collection Time: 04/06/18 10:54 AM  Result Value Ref Range   WBC 16.1 (H) 3.4 - 10.8 x10E3/uL   RBC 2.25 (LL) 3.77 - 5.28 x10E6/uL   Hemoglobin 6.7 (LL) 11.1 - 15.9 g/dL   Hematocrit 19.9 (L) 34.0 - 46.6 %   MCV 88 79 - 97 fL   MCH 29.8 26.6 - 33.0 pg   MCHC 33.7 31.5 - 35.7 g/dL   RDW 22.0 (H) 12.3 - 15.4 %   Platelets 444 150 - 450 x10E3/uL   Neutrophils 63 Not Estab. %   Lymphs 20 Not Estab. %   Monocytes 14 Not Estab. %   Eos 1 Not Estab. %   Basos 1 Not Estab. %   Neutrophils Absolute 10.1 (H) 1.4 - 7.0 x10E3/uL   Lymphocytes Absolute 3.1 0.7 - 3.1 x10E3/uL   Monocytes Absolute  2.3 (H) 0.1 - 0.9 x10E3/uL   EOS (ABSOLUTE) 0.2 0.0 - 0.4 x10E3/uL   Basophils Absolute 0.2 0.0 - 0.2 x10E3/uL   Immature Granulocytes 1 Not Estab. %   Immature Grans (Abs) 0.2 (H) 0.0 - 0.1 x10E3/uL   NRBC 14 (H) 0 - 0 %   Hematology Comments: Note:    Narrative   Performed at:  [facility] [address], [place], [place]  [id number] Lab Director: Rush Farmer MD, Phone:  [phone]  Comprehensive metabolic panel     Status: Abnormal   Collection Time: 04/06/18 10:54 AM  Result Value Ref Range   Glucose 81 65 - 99 mg/dL   BUN 8 6 - 24 mg/dL   Creatinine, Ser 0.49 (L) 0.57 - 1.00 mg/dL   GFR calc non Af Amer 117 >59 mL/min/1.73   GFR calc Af Amer 135 >59 mL/min/1.73   BUN/Creatinine Ratio 16 9 - 23   Sodium 141 134 - 144 mmol/L   Potassium 4.1 3.5 - 5.2 mmol/L   Chloride 105 96 - 106 mmol/L   CO2 20 20 - 29 mmol/L   Calcium 9.4 8.7 - 10.2 mg/dL   Total Protein 7.2 6.0 - 8.5 g/dL   Albumin 4.4 3.5 - 5.5 g/dL   Globulin, Total 2.8 1.5 - 4.5 g/dL   Albumin/Globulin Ratio 1.6 1.2 - 2.2   Bilirubin Total 4.6 (H) 0.0 - 1.2 mg/dL   Alkaline Phosphatase 57 39 - 117 IU/L   AST 26 0 - 40 IU/L   ALT 15 0 - 32 IU/L   Narrative   Performed at:  [facility] [address] [place], [place]  [id number] Lab Director: Rush Farmer MD, Phone:  [phone]  POCT URINALYSIS DIP (CLINITEK)     Status: Abnormal   Collection Time: 04/06/18 10:59 AM  Result Value Ref Range   Color, UA straw (A) yellow   Clarity, UA hazy (A) clear   Glucose, UA negative negative mg/dL   Bilirubin, UA negative negative   Ketones, POC UA negative negative mg/dL   Spec Grav, UA 1.015 1.010 - 1.025   Blood, UA trace-intact (A) negative   pH, UA 6.0 5.0 - 8.0   POC PROTEIN,UA =30 (A) negative, trace   Urobilinogen, UA 1.0 0.2 or 1.0  E.U./dL   Nitrite, UA Negative Negative   Leukocytes, UA Negative Negative    Imaging results:  Xr Finger Little Left  Result Date: 04/06/2018 3  views of the left fifth finger show a bony mallet deformity with slight subluxation at the DIP joint.  The alignment is improved since previous films.    Assessment & Plan:  Patient will be admitted to the day infusion center for extended observation  Start IV 0.45 for cellular rehydration at 75/hr  Start Toradol 15 mg IV every 6 hours for inflammation.  Start Dilaudid PCA High Concentration per weight based protocol.   Patient will be re-evaluated for pain intensity in the context of function and relationship to baseline as care progresses.  If no significant pain relief, will transfer patient to inpatient services for a higher level of care.   Reviewed CBC on 04/06/2018, hemoglobin 6.7, repeat. Review reticulocytes as results become available. Repeat CBC  Transfuse 1 unit of PRBCs   [name]  APRN, MSN, [facility] [address] [place], [place] [address] [phone]  04/07/2018, 9:34 AM

## 2018-04-07 NOTE — Progress Notes (Signed)
CRITICAL VALUE ALERT  Critical Value: Hemoglobin 6.7  Date & Time Notied:  04/07/18 at 1100  Provider Notified: [place] Hollis, FNP  Orders Received/Actions taken: One (1) unit of PRBC's ordered.

## 2018-04-07 NOTE — Progress Notes (Signed)
CRITICAL VALUE ALERT  Critical Value:  Hemoglobin 6.3  Date & Time Notied:  04/07/2018; 14:56  Provider Notified: hollis L NP  Orders Received/Actions taken: Repeat H &H today, Patient to come back on 04/09/2018 for another lab draw.

## 2018-04-07 NOTE — Discharge Instructions (Signed)
Resume all home medications. Your hemoglobin was 6.7, you received 1 unit of red blood cells during admission. You will return to clinic on Thursday to repeat CBC.   Continue to hydrate with 64 ounces of water. I recommend that you restart Endari. The findings showed a 25% reduction in the frequency of sickle-cell crises, a 33% decrease in hospitalisation rates, fewer hospital visits due to sickle-cell pain and 60% fewer occurrences of acute chest syndrome in patients administered with Endari.       Sickle Cell Anemia, Adult Sickle cell anemia is a condition where your red blood cells are shaped like sickles. Red blood cells carry oxygen through the body. Sickle-shaped red blood cells do not live as long as normal red blood cells. They also clump together and block blood from flowing through the blood vessels. These things prevent the body from getting enough oxygen. Sickle cell anemia causes organ damage and pain. It also increases the risk of infection. Follow these instructions at home:  Drink enough fluid to keep your pee (urine) clear or pale yellow. Drink more in hot weather and during exercise.  Do not smoke. Smoking lowers oxygen levels in the blood.  Only take over-the-counter or prescription medicines as told by your doctor.  Take antibiotic medicines as told by your doctor. Make sure you finish them even if you start to feel better.  Take supplements as told by your doctor.  Consider wearing a medical alert bracelet. This tells anyone caring for you in an emergency of your condition.  When traveling, keep your medical information, doctors' names, and the medicines you take with you at all times.  If you have a fever, do not take fever medicines right away. This could cover up a problem. Tell your doctor.  Keep all follow-up visits with your doctor. Sickle cell anemia requires regular medical care. Contact a doctor if: You have a fever. Get help right away if:  You feel  dizzy or faint.  You have new belly (abdominal) pain, especially on the left side near the stomach area.  You have a lasting, often uncomfortable and painful erection of the penis (priapism). If it is not treated right away, you will become unable to have sex (impotence).  You have numbness in your arms or legs or you have a hard time moving them.  You have a hard time talking.  You have a fever or lasting symptoms for more than 2-3 days.  You have a fever and your symptoms suddenly get worse.  You have signs or symptoms of infection. These include: ? Chills. ? Being more tired than normal (lethargy). ? Irritability. ? Poor eating. ? Throwing up (vomiting).  You have pain that is not helped with medicine.  You have shortness of breath.  You have pain in your chest.  You are coughing up pus-like or bloody mucus.  You have a stiff neck.  Your feet or hands swell or have pain.  Your belly looks bloated.  Your joints hurt. This information is not intended to replace advice given to you by your health care provider. Make sure you discuss any questions you have with your health care provider. Document Released: 02/24/2013 Document Revised: 10/12/2015 Document Reviewed: 12/16/2012 Elsevier Interactive Patient Education  2017 [employer].

## 2018-04-07 NOTE — Progress Notes (Signed)
[name] came to the waiting room of [name] for triage. [name] reports pain in left arm and bilateral legs rated 8/10. Denies fever, chest pain, nausea, vomiting and abdominal pain. Reports last taking Oxycodone for pain at 7:00 am. [place], [place] notified. [name] will be admitted to the day hospital for sickle cell crisis. [name] notified and expresses an understanding.

## 2018-04-08 ENCOUNTER — Encounter: Payer: Self-pay | Admitting: Family Medicine

## 2018-04-08 LAB — BPAM RBC
Blood Product Expiration Date: 201912162359
ISSUE DATE / TIME: 201911191130
Unit Type and Rh: 5100

## 2018-04-08 LAB — TYPE AND SCREEN
ABO/RH(D): B POS
Antibody Screen: NEGATIVE
UNIT DIVISION: 0

## 2018-04-09 ENCOUNTER — Encounter (HOSPITAL_COMMUNITY): Payer: Medicaid Other

## 2018-04-14 ENCOUNTER — Ambulatory Visit (HOSPITAL_COMMUNITY)
Admission: RE | Admit: 2018-04-14 | Discharge: 2018-04-14 | Disposition: A | Discharge status: Home / Self Care | Payer: Medicaid Other | Source: Ambulatory Visit | Attending: Family Medicine | Admitting: Family Medicine

## 2018-04-14 ENCOUNTER — Other Ambulatory Visit: Payer: Self-pay | Admitting: Family Medicine

## 2018-04-14 DIAGNOSIS — D571 Sickle-cell disease 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: Secondary | ICD-10-CM | POA: Insufficient documentation

## 2018-04-14 DIAGNOSIS — D649 Anemia, unspecified: Secondary | ICD-10-CM | POA: Diagnosis present

## 2018-04-14 LAB — COMPREHENSIVE METABOLIC PANEL
ALBUMIN: 4.7 g/dL (ref 3.5–5.0)
ALT: 20 U/L (ref 0–44)
AST: 31 U/L (ref 15–41)
Alkaline Phosphatase: 45 U/L (ref 38–126)
Anion gap: 8 (ref 5–15)
BUN: 9 mg/dL (ref 6–20)
CHLORIDE: 105 mmol/L (ref 98–111)
CO2: 25 mmol/L (ref 22–32)
Calcium: 9.2 mg/dL (ref 8.9–10.3)
Creatinine, Ser: 0.34 mg/dL — ABNORMAL LOW (ref 0.44–1.00)
GFR calc Af Amer: >60 mL/min (ref >60)
GFR calc non Af Amer: >60 mL/min (ref >60)
Glucose, Bld: 109 mg/dL — ABNORMAL HIGH (ref 70–99)
POTASSIUM: 3.7 mmol/L (ref 3.5–5.1)
Sodium: 138 mmol/L (ref 135–145)
Total Bilirubin: 5.6 mg/dL — ABNORMAL HIGH (ref 0.3–1.2)
Total Protein: 8.4 g/dL — ABNORMAL HIGH (ref 6.5–8.1)

## 2018-04-14 LAB — CBC WITH DIFFERENTIAL/PLATELET
ABS IMMATURE GRANULOCYTES: 0.2 10*3/uL — AB (ref 0.00–0.07)
BASOS PCT: 1 %
Basophils Absolute: 0.1 10*3/uL (ref 0.0–0.1)
EOS ABS: 0 10*3/uL (ref 0.0–0.5)
Eosinophils Relative: 0 %
HEMATOCRIT: 22.9 % — AB (ref 36.0–46.0)
Hemoglobin: 7.8 g/dL — ABNORMAL LOW (ref 12.0–15.0)
IMMATURE GRANULOCYTES: 1 %
LYMPHS ABS: 1.4 10*3/uL (ref 0.7–4.0)
Lymphocytes Relative: 10 %
MCH: 31 pg (ref 26.0–34.0)
MCHC: 34.1 g/dL (ref 30.0–36.0)
MCV: 90.9 fL (ref 80.0–100.0)
MONO ABS: 2 10*3/uL — AB (ref 0.1–1.0)
MONOS PCT: 14 %
NEUTROS PCT: 74 %
Neutro Abs: 10.2 10*3/uL — ABNORMAL HIGH (ref 1.7–7.7)
PLATELETS: 527 10*3/uL — AB (ref 150–400)
RBC: 2.52 MIL/uL — ABNORMAL LOW (ref 3.87–5.11)
RDW: 23.4 % — AB (ref 11.5–15.5)
WBC: 14 10*3/uL — ABNORMAL HIGH (ref 4.0–10.5)
nRBC: 8.2 % — ABNORMAL HIGH (ref 0.0–0.2)

## 2018-04-14 NOTE — Discharge Instructions (Signed)
Today your labs were drawn. CBC and CMP.

## 2018-04-14 NOTE — Progress Notes (Signed)
PATIENT CARE CENTER NOTE  Diagnosis: Sickle Cell anemia    Provider: [name], FNP   Procedure: Lab draw   Note: Patient's labs were drawn. CBC and CMP. Tolerated lab draw well. Discharge instructions given. Patient alert, oriented and ambulatory at discharge.

## 2018-04-20 ENCOUNTER — Telehealth: Payer: Self-pay

## 2018-04-20 DIAGNOSIS — D571 Sickle-cell disease without crisis: Secondary | ICD-10-CM

## 2018-04-20 DIAGNOSIS — G894 Chronic pain syndrome: Secondary | ICD-10-CM

## 2018-04-20 MED ORDER — OXYCODONE HCL 10 MG PO TABS: 10.0000 mg | ORAL_TABLET | ORAL | 0 refills | Status: DC | PRN

## 2018-04-20 MED ORDER — IBUPROFEN 600 MG PO TABS: 600.0000 mg | ORAL_TABLET | Freq: Four times a day (QID) | ORAL | 2 refills | Status: DC

## 2018-04-20 NOTE — Telephone Encounter (Signed)
Refilled

## 2018-05-04 ENCOUNTER — Ambulatory Visit: Payer: Medicaid Other | Admitting: Family Medicine

## 2018-05-18 ENCOUNTER — Other Ambulatory Visit: Payer: Self-pay | Admitting: Family Medicine

## 2018-05-18 ENCOUNTER — Telehealth: Payer: Self-pay

## 2018-05-18 DIAGNOSIS — G894 Chronic pain syndrome: Secondary | ICD-10-CM

## 2018-05-18 DIAGNOSIS — D571 Sickle-cell disease without crisis: Secondary | ICD-10-CM

## 2018-05-18 MED ORDER — OXYCODONE HCL 10 MG PO TABS: 10.0000 mg | ORAL_TABLET | ORAL | 0 refills | Status: DC | PRN

## 2018-05-18 NOTE — Telephone Encounter (Signed)
Patient notified

## 2018-06-22 ENCOUNTER — Telehealth: Payer: Self-pay

## 2018-06-22 DIAGNOSIS — G894 Chronic pain syndrome: Secondary | ICD-10-CM

## 2018-06-22 DIAGNOSIS — D571 Sickle-cell disease without crisis: Secondary | ICD-10-CM

## 2018-06-23 MED ORDER — OXYCODONE HCL 10 MG PO TABS: 10.0000 mg | ORAL_TABLET | ORAL | 0 refills | Status: DC | PRN

## 2018-06-23 NOTE — Telephone Encounter (Signed)
refilled 

## 2018-06-25 ENCOUNTER — Encounter (HOSPITAL_COMMUNITY): Payer: Self-pay | Admitting: General Practice

## 2018-06-25 ENCOUNTER — Telehealth (HOSPITAL_COMMUNITY): Payer: Self-pay | Admitting: *Deleted

## 2018-06-25 ENCOUNTER — Non-Acute Institutional Stay (HOSPITAL_BASED_OUTPATIENT_CLINIC_OR_DEPARTMENT_OTHER)
Admission: AD | Admit: 2018-06-25 | Discharge: 2018-06-25 | Disposition: A | Discharge status: Home / Self Care | Payer: Medicaid Other | Source: Ambulatory Visit | Attending: Internal Medicine | Admitting: Internal Medicine

## 2018-06-25 DIAGNOSIS — Z79899 Other long term (current) drug therapy: Secondary | ICD-10-CM | POA: Insufficient documentation

## 2018-06-25 DIAGNOSIS — D5700 Hb-SS disease with crisis, unspecified: Secondary | ICD-10-CM | POA: Diagnosis not present

## 2018-06-25 DIAGNOSIS — G894 Chronic pain syndrome: Secondary | ICD-10-CM | POA: Insufficient documentation

## 2018-06-25 DIAGNOSIS — Z791 Long term (current) use of non-steroidal anti-inflammatories (NSAID): Secondary | ICD-10-CM | POA: Insufficient documentation

## 2018-06-25 DIAGNOSIS — Z832 Family history of diseases of the blood and blood-forming organs and certain disorders involving the immune mechanism: Secondary | ICD-10-CM

## 2018-06-25 LAB — CBC WITH DIFFERENTIAL/PLATELET
Abs Immature Granulocytes: 0.18 10*3/uL — ABNORMAL HIGH (ref 0.00–0.07)
BASOS PCT: 1 %
Basophils Absolute: 0.1 10*3/uL (ref 0.0–0.1)
EOS PCT: 1 %
Eosinophils Absolute: 0.2 10*3/uL (ref 0.0–0.5)
HEMATOCRIT: 21.2 % — AB (ref 36.0–46.0)
Hemoglobin: 7 g/dL — ABNORMAL LOW (ref 12.0–15.0)
Immature Granulocytes: 1 %
Lymphocytes Relative: 12 %
Lymphs Abs: 1.9 10*3/uL (ref 0.7–4.0)
MCH: 30.7 pg (ref 26.0–34.0)
MCHC: 33 g/dL (ref 30.0–36.0)
MCV: 93 fL (ref 80.0–100.0)
Monocytes Absolute: 2.4 10*3/uL — ABNORMAL HIGH (ref 0.1–1.0)
Monocytes Relative: 15 %
Neutro Abs: 11.4 10*3/uL — ABNORMAL HIGH (ref 1.7–7.7)
Neutrophils Relative %: 70 %
Platelets: 567 10*3/uL — ABNORMAL HIGH (ref 150–400)
RBC: 2.28 MIL/uL — ABNORMAL LOW (ref 3.87–5.11)
RDW: 25.1 % — ABNORMAL HIGH (ref 11.5–15.5)
WBC: 16.2 10*3/uL — ABNORMAL HIGH (ref 4.0–10.5)
nRBC: 14.5 % — ABNORMAL HIGH (ref 0.0–0.2)

## 2018-06-25 LAB — COMPREHENSIVE METABOLIC PANEL
ALT: 14 U/L (ref 0–44)
AST: 22 U/L (ref 15–41)
Albumin: 4.8 g/dL (ref 3.5–5.0)
Alkaline Phosphatase: 40 U/L (ref 38–126)
Anion gap: 9 (ref 5–15)
BILIRUBIN TOTAL: 6.1 mg/dL — AB (ref 0.3–1.2)
BUN: 8 mg/dL (ref 6–20)
CO2: 24 mmol/L (ref 22–32)
Calcium: 9.3 mg/dL (ref 8.9–10.3)
Chloride: 105 mmol/L (ref 98–111)
Creatinine, Ser: 0.36 mg/dL — ABNORMAL LOW (ref 0.44–1.00)
GFR calc Af Amer: >60 mL/min (ref >60)
GFR calc non Af Amer: >60 mL/min (ref >60)
Glucose, Bld: 105 mg/dL — ABNORMAL HIGH (ref 70–99)
Potassium: 3.3 mmol/L — ABNORMAL LOW (ref 3.5–5.1)
Sodium: 138 mmol/L (ref 135–145)
Total Protein: 7.7 g/dL (ref 6.5–8.1)

## 2018-06-25 LAB — RETICULOCYTES
Immature Retic Fract: 41.8 % — ABNORMAL HIGH (ref 2.3–15.9)
RBC.: 2.28 MIL/uL — ABNORMAL LOW (ref 3.87–5.11)
Retic Count, Absolute: 546.8 10*3/uL — ABNORMAL HIGH (ref 19.0–186.0)
Retic Ct Pct: 24.4 % — ABNORMAL HIGH (ref 0.4–3.1)

## 2018-06-25 LAB — PREGNANCY, URINE: Preg Test, Ur: NEGATIVE

## 2018-06-25 MED ORDER — SODIUM CHLORIDE 0.9 % IV SOLN
25.0000 mg | INTRAVENOUS | Status: DC | PRN
  Filled 2018-06-25: qty 0.5

## 2018-06-25 MED ORDER — KETOROLAC TROMETHAMINE 15 MG/ML IJ SOLN
15.0000 mg | Freq: Four times a day (QID) | INTRAMUSCULAR | Status: DC
  Administered 2018-06-25: 15 mg via INTRAVENOUS
  Filled 2018-06-25: qty 1

## 2018-06-25 MED ORDER — SODIUM CHLORIDE 0.45 % IV SOLN
INTRAVENOUS | Status: DC
  Administered 2018-06-25: 11:00:00 via INTRAVENOUS

## 2018-06-25 MED ORDER — SODIUM CHLORIDE 0.9% FLUSH: 9.0000 mL | INTRAVENOUS | Status: DC | PRN

## 2018-06-25 MED ORDER — HYDROMORPHONE 1 MG/ML IV SOLN
INTRAVENOUS | Status: DC
  Administered 2018-06-25: 13.5 mg via INTRAVENOUS
  Administered 2018-06-25: 30 mg via INTRAVENOUS
  Filled 2018-06-25: qty 30

## 2018-06-25 MED ORDER — NALOXONE HCL 0.4 MG/ML IJ SOLN: 0.4000 mg | INTRAMUSCULAR | Status: DC | PRN

## 2018-06-25 MED ORDER — ONDANSETRON HCL 4 MG/2ML IJ SOLN: 4.0000 mg | Freq: Four times a day (QID) | INTRAMUSCULAR | Status: DC | PRN

## 2018-06-25 MED ORDER — DIPHENHYDRAMINE HCL 25 MG PO CAPS: 25.0000 mg | ORAL_CAPSULE | ORAL | Status: DC | PRN

## 2018-06-25 NOTE — Telephone Encounter (Signed)
Patient called requesting to come to the day hospital for sickle cell pain. Patient reports pain in hand rated 10/10. Reports last taking Oxycodone for pain at 6:00 am. Denies fever, chest pain, nausea, vomiting, diarrhea and abdominal pain. Admits to having means of transportation at discharge without driving self. [place], [place] notified. Patient can come to the day hospital for pain management.  Patient advised and expresses an understanding.

## 2018-06-25 NOTE — H&P (Signed)
[facility] History and Physical   Date: 06/25/2018  Patient name: [name] Medical record number: [id number] Date of birth: [birth year] Age: 48 y.o. Gender: female PCP: [name], [place]  Attending physician: Tresa Garter, MD  Chief Complaint: Left hand and lower extremity pain  History of Present Illness: [name], a 48 year old female with a medical history significant for sickle cell anemia, hemoglobin SS, chronic pain syndrome, chronic ulcers of right lower extremity presents complaining of pain primarily to left hand and bilateral lower extremities, that is consistent with previous sickle cell crisis.  Patient states that pain is been increasing over the past several days and has not been controlled with home medications.  She states that right lower extremity pain increased following debridement of right lower extremity ulcers 1 week ago.  Current pain intensity 9/10, she characterizes pain as constant and throbbing, occasionally sharp.  She last had oxycodone this a.m. without sustained relief.  She denies headache, chest pain, dysuria, nausea, vomiting, or diarrhea.  Meds: Medications Prior to Admission  Medication Sig Dispense Refill Last Dose  . Cholecalciferol (VITAMIN D) 2000 units CAPS Take 2 capsules (4,000 Units total) by mouth daily. 30 capsule 11 Taking  . Clobetasol Prop Emollient Base (CLOBETASOL PROPIONATE E) 0.05 % emollient cream Apply 1 application topically 2 (two) times daily. (Patient not taking: Reported on 05/14/2017) 30 g 1 Not Taking  . fluticasone (FLONASE) 50 MCG/ACT nasal spray Place 2 sprays into both nostrils daily. (Patient not taking: Reported on 02/02/2018) 16 g 6 Not Taking  . folic acid (FOLVITE) 1 MG tablet Take 5 tablets (5 mg total) by mouth daily. 150 tablet 11 04/06/2018 at Unknown time  . ibuprofen (ADVIL,MOTRIN) 600 MG tablet Take 1 tablet (600 mg total) by mouth every 6 (six) hours. 60 tablet 2   . L-glutamine  (ENDARI) 5 g PACK Powder Packet Take 5 g by mouth 2 (two) times daily. 180 each 1   . Oxycodone HCl 10 MG TABS Take 1 tablet (10 mg total) by mouth every 4 (four) hours as needed for up to 15 days (pain). 90 tablet 0     Allergies: Patient has no known allergies. Past Medical History:  Diagnosis Date  . Anemia 03/30/2012   Hx of sickle cell disease  . CAP (community acquired pneumonia)    2014  . Cholelithiasis 03/30/2012  . Chronic wound of extremity 04/01/2012  . Elevated LFTs   . Leg ulcer ([place]) 10/27/2012   Chronic under care of wound clinic  . Multiple open wounds of lower extremity    chronic wounds B/LLE  . Sickle cell disease ([place])   . Sinus bradycardia by electrocardiogram 04/03/2012   Past Surgical History:  Procedure Laterality Date  . ALLOGRAFT APPLICATION Right 4/85/4627   Procedure: SURGICAL PREP FOR GRAFTING RIGHT LOWER EXTREMITY AND APPLICATION OF Jannifer Hick;  Surgeon: Irene Limbo, MD;  Location: [facility];  Service: Plastics;  Laterality: Right;  . APPLICATION OF A-CELL OF EXTREMITY Right 10/09/2015   Procedure: APPLICATION OF Jannifer Hick;  Surgeon: Irene Limbo, MD;  Location: [facility];  Service: Plastics;  Laterality: Right;  . BREAST SURGERY     left breast cyst aspiration  . CESAREAN SECTION    . CESAREAN SECTION N/A 01/06/2014   Procedure: CESAREAN SECTION;  Surgeon: Emily Filbert, MD;  Location: [facility];  Service: Obstetrics;  Laterality: N/A;  . CESAREAN SECTION N/A 05/04/2017   Procedure: CESAREAN SECTION;  Surgeon: [name]  G, MD;  Location: [facility];  Service: Obstetrics;  Laterality: N/A;  . I&D EXTREMITY Right 10/09/2015   Procedure: SURGICAL PREPARATION FOR GRAFTING RIGHT ANKLE AND APPLICATION THERASKIN;  Surgeon: Irene Limbo, MD;  Location: [facility];  Service: Plastics;  Laterality: Right;  . SKIN FULL THICKNESS GRAFT Bilateral 06/17/2016   Procedure: SURGICAL PREP FOR GRAFTING,  BILATERAL LOWER EXTREMITIES AND APPLICATION OF Jannifer Hick;  Surgeon: Irene Limbo, MD;  Location: [facility];  Service: Plastics;  Laterality: Bilateral;  . TONSILLECTOMY     Family History  Problem Relation Age of Onset  . Sickle cell anemia Sister   . Diabetes Mother   . Asthma Mother   . Sickle cell trait Mother   . Sickle cell trait Father   . Hypertension Father    Social History   Socioeconomic History  . Marital status: Married    Spouse name: [name]  . Number of children: 1  . Years of education: Not on file  . Highest education level: Not on file  Occupational History  . Occupation: Employed in home care.  Social Needs  . Financial resource strain: Not on file  . Food insecurity:    Worry: Not on file    Inability: Not on file  . Transportation needs:    Medical: Not on file    Non-medical: Not on file  Tobacco Use  . Smoking status: Never Smoker  . Smokeless tobacco: Never Used  Substance and Sexual Activity  . Alcohol use: No  . Drug use: No  . Sexual activity: Yes    Birth control/protection: None  Lifestyle  . Physical activity:    Days per week: Not on file    Minutes per session: Not on file  . Stress: Not on file  Relationships  . Social connections:    Talks on phone: Not on file    Gets together: Not on file    Attends religious service: Not on file    Active member of club or organization: Not on file    Attends meetings of clubs or organizations: Not on file    Relationship status: Not on file  . Intimate partner violence:    Fear of current or ex partner: Not on file    Emotionally abused: Not on file    Physically abused: Not on file    Forced sexual activity: Not on file  Other Topics Concern  . Not on file  Social History Narrative   Lives with husband.   Review of Systems  Constitutional: Negative.  Negative for chills and fever.  HENT: Negative.   Eyes: Negative.   Respiratory: Negative.   Gastrointestinal:  Negative.   Genitourinary: Negative.   Musculoskeletal: Positive for joint pain.  Neurological: Negative.   Endo/Heme/Allergies: Negative.   Psychiatric/Behavioral: Negative.      Physical Exam: Blood pressure (!) 142/76, pulse 82, temperature 98.2 F (36.8 C), temperature source Oral, resp. rate 16, SpO2 93 %, unknown if currently breastfeeding. Physical Exam Constitutional:      Appearance: Normal appearance.  HENT:     Head: Normocephalic.  Neck:     Musculoskeletal: Normal range of motion.  Cardiovascular:     Rate and Rhythm: Normal rate and regular rhythm.  Pulmonary:     Effort: Pulmonary effort is normal.     Breath sounds: Normal breath sounds.  Abdominal:     General: Abdomen is flat.     Palpations: Abdomen is soft.  Skin:  Comments: Right lower extremity ulcers, unable to assess due to bandaging  Neurological:     General: No focal deficit present.     Mental Status: She is alert.     Motor: No weakness.     Gait: Gait normal.  Psychiatric:        Mood and Affect: Mood normal.        Behavior: Behavior normal.        Thought Content: Thought content normal.        Judgment: Judgment normal.     Lab results: No results found for this or any previous visit (from the past 24 hour(s)).  Imaging results:  No results found.   Assessment & Plan:  Patient will be admitted to the day infusion center for extended observation   05.45 for cellular rehydration at 125/hr  Toradol 15 mg IV every 6 hours for inflammation.  Dilaudid PCA High Concentration per weight based protocol.   Patient will be re-evaluated for pain intensity in the context of function and relationship to baseline as care progresses.  If no significant pain relief, will transfer patient to inpatient services for a higher level of care.    CMP, reticulocytes and CBC w/differential  [name]  APRN, MSN, FNP-C [facility] [address]  [place], [place] [address] [phone] 06/25/2018, 10:08 AM

## 2018-06-25 NOTE — Discharge Instructions (Signed)
Sickle Cell Anemia, Adult °Sickle cell anemia is a condition where your red blood cells are shaped like sickles. Red blood cells carry oxygen through the body. Sickle-shaped cells do not live as long as normal red blood cells. They also clump together and block blood from flowing through the blood vessels. This prevents the body from getting enough oxygen. Sickle cell anemia causes organ damage and pain. It also increases the risk of infection. °Follow these instructions at home: °Medicines °· Take over-the-counter and prescription medicines only as told by your doctor. °· If you were prescribed an antibiotic medicine, take it as told by your doctor. Do not stop taking the antibiotic even if you start to feel better. °· If you develop a fever, do not take medicines to lower the fever right away. Tell your doctor about the fever. °Managing pain, stiffness, and swelling °· Try these methods to help with pain: °? Use a heating pad. °? Take a warm bath. °? Distract yourself, such as by watching TV. °Eating and drinking °· Drink enough fluid to keep your pee (urine) clear or pale yellow. Drink more in hot weather and during exercise. °· Limit or avoid alcohol. °· Eat a healthy diet. Eat plenty of fruits, vegetables, whole grains, and lean protein. °· Take vitamins and supplements as told by your doctor. °Traveling °· When traveling, keep these with you: °? Your medical information. °? The names of your doctors. °? Your medicines. °· If you need to take an airplane, talk to your doctor first. °Activity °· Rest often. °· Avoid exercises that make your heart beat much faster, such as jogging. °General instructions °· Do not use products that have nicotine or tobacco, such as cigarettes and e-cigarettes. If you need help quitting, ask your doctor. °· Consider wearing a medical alert bracelet. °· Avoid being in high places (high altitudes), such as mountains. °· Avoid very hot or cold temperatures. °· Avoid places where the  temperature changes a lot. °· Keep all follow-up visits as told by your doctor. This is important. °Contact a doctor if: °· A joint hurts. °· Your feet or hands hurt or swell. °· You feel tired (fatigued). °Get help right away if: °· You have symptoms of infection. These include: °? Fever. °? Chills. °? Being very tired. °? Irritability. °? Poor eating. °? Throwing up (vomiting). °· You feel dizzy or faint. °· You have new stomach pain, especially on the left side. °· You have a an erection (priapism) that lasts more than 4 hours. °· You have numbness in your arms or legs. °· You have a hard time moving your arms or legs. °· You have trouble talking. °· You have pain that does not go away when you take medicine. °· You are short of breath. °· You are breathing fast. °· You have a long-term cough. °· You have pain in your chest. °· You have a bad headache. °· You have a stiff neck. °· Your stomach looks bloated even though you did not eat much. °· Your skin is pale. °· You suddenly cannot see well. °Summary °· Sickle cell anemia is a condition where your red blood cells are shaped like sickles. °· Follow your doctor's advice on ways to manage pain, food to eat, activities to do, and steps to take for safe travel. °· Get medical help right away if you have any signs of infection, such as a fever. °This information is not intended to replace advice given to you by your   health care provider. Make sure you discuss any questions you have with your health care provider. °Document Released: 02/24/2013 Document Revised: 06/11/2016 Document Reviewed: 06/11/2016 °Elsevier Interactive Patient Education © 2019 [employer] ° °Sickle Cell Anemia, Adult °Sickle cell anemia is a condition where your red blood cells are shaped like sickles. Red blood cells carry oxygen through the body. Sickle-shaped cells do not live as long as normal red blood cells. They also clump together and block blood from flowing through the blood vessels.  This prevents the body from getting enough oxygen. Sickle cell anemia causes organ damage and pain. It also increases the risk of infection. °Follow these instructions at home: °Medicines °· Take over-the-counter and prescription medicines only as told by your doctor. °· If you were prescribed an antibiotic medicine, take it as told by your doctor. Do not stop taking the antibiotic even if you start to feel better. °· If you develop a fever, do not take medicines to lower the fever right away. Tell your doctor about the fever. °Managing pain, stiffness, and swelling °· Try these methods to help with pain: °? Use a heating pad. °? Take a warm bath. °? Distract yourself, such as by watching TV. °Eating and drinking °· Drink enough fluid to keep your pee (urine) clear or pale yellow. Drink more in hot weather and during exercise. °· Limit or avoid alcohol. °· Eat a healthy diet. Eat plenty of fruits, vegetables, whole grains, and lean protein. °· Take vitamins and supplements as told by your doctor. °Traveling °· When traveling, keep these with you: °? Your medical information. °? The names of your doctors. °? Your medicines. °· If you need to take an airplane, talk to your doctor first. °Activity °· Rest often. °· Avoid exercises that make your heart beat much faster, such as jogging. °General instructions °· Do not use products that have nicotine or tobacco, such as cigarettes and e-cigarettes. If you need help quitting, ask your doctor. °· Consider wearing a medical alert bracelet. °· Avoid being in high places (high altitudes), such as mountains. °· Avoid very hot or cold temperatures. °· Avoid places where the temperature changes a lot. °· Keep all follow-up visits as told by your doctor. This is important. °Contact a doctor if: °· A joint hurts. °· Your feet or hands hurt or swell. °· You feel tired (fatigued). °Get help right away if: °· You have symptoms of infection. These  include: °? Fever. °? Chills. °? Being very tired. °? Irritability. °? Poor eating. °? Throwing up (vomiting). °· You feel dizzy or faint. °· You have new stomach pain, especially on the left side. °· You have a an erection (priapism) that lasts more than 4 hours. °· You have numbness in your arms or legs. °· You have a hard time moving your arms or legs. °· You have trouble talking. °· You have pain that does not go away when you take medicine. °· You are short of breath. °· You are breathing fast. °· You have a long-term cough. °· You have pain in your chest. °· You have a bad headache. °· You have a stiff neck. °· Your stomach looks bloated even though you did not eat much. °· Your skin is pale. °· You suddenly cannot see well. °Summary °· Sickle cell anemia is a condition where your red blood cells are shaped like sickles. °· Follow your doctor's advice on ways to manage pain, food to eat, activities to do, and steps   to take for safe travel. °· Get medical help right away if you have any signs of infection, such as a fever. °This information is not intended to replace advice given to you by your health care provider. Make sure you discuss any questions you have with your health care provider. °Document Released: 02/24/2013 Document Revised: 06/11/2016 Document Reviewed: 06/11/2016 °Elsevier Interactive Patient Education © 2019 Elsevier Inc. ° °

## 2018-06-25 NOTE — Discharge Summary (Signed)
[facility] Discharge Summary   Patient ID: [name] MRN: [id number] DOB/AGE: November 13, 1970 48 y.o.  Admit date: 06/25/2018 Discharge date: 06/25/2018  Primary Care Physician:  [name], [place]  Admission Diagnoses:  Active Problems:   Sickle cell pain crisis [facility])  Discharge Medications:  Allergies as of 06/25/2018   No Known Allergies     Medication List    TAKE these medications   Clobetasol Prop Emollient Base 0.05 % emollient cream Commonly known as:  CLOBETASOL PROPIONATE E Apply 1 application topically 2 (two) times daily.   fluticasone 50 MCG/ACT nasal spray Commonly known as:  FLONASE Place 2 sprays into both nostrils daily.   folic acid 1 MG tablet Commonly known as:  FOLVITE Take 5 tablets (5 mg total) by mouth daily.   ibuprofen 600 MG tablet Commonly known as:  ADVIL,MOTRIN Take 1 tablet (600 mg total) by mouth every 6 (six) hours.   L-glutamine 5 g Pack Powder Packet Commonly known as:  ENDARI Take 5 g by mouth 2 (two) times daily.   Oxycodone HCl 10 MG Tabs Take 1 tablet (10 mg total) by mouth every 4 (four) hours as needed for up to 15 days (pain).   Vitamin D 50 MCG (2000 UT) Caps Take 2 capsules (4,000 Units total) by mouth daily.        Consults:  None  Significant Diagnostic Studies:  No results found.   [facility] Course: [name], very pleasant 48 year old female with a medical history significant for sickle cell anemia, chronic pain syndrome, and chronic leg ulcers was admitted to sickle cell day infusion center for pain management and extended observation. Patient's hemoglobin 7.0, which is consistent with baseline of 7.0-8.0 g/dL.  No blood transfusion indicated at this time.  Patient typically not transfused unless hemoglobin is less than 6.0. WBCs 16.2, patient afebrile.  Patient has chronic leg ulcers, no purulent drainage.  0.45% saline at 100 mL/h Pain control with PCA Dilaudid per  weight-based protocol.  Patient used a total of 12.5 mg with 25 demands and 25 deliver. Pain intensity decreased to 4/10. Patient alert, oriented, and ambulating without assistance. Patient advised to follow-up with wound care and schedule a first available primary care appointment. Also, recommend that patient returns on 06/29/2017 to repeat CBC.  Discharge instructions: Resume all home medications Continue to hydrate with 64 ounces of fluid daily Avoid all stressors that precipitate sickle cell pain crisis   The patient was given clear instructions to go to ER or return to medical center if symptoms do not improve, worsen or new problems develop. The patient verbalized understanding.     Physical Exam at Discharge:  BP 138/72 (BP Location: Left Arm)   Pulse 67   Temp 98.2 F (36.8 C) (Oral)   Resp 16   LMP 06/23/2018   SpO2 97%   Physical Exam Constitutional:      Appearance: Normal appearance.  HENT:     Mouth/Throat:     Mouth: Mucous membranes are moist.  Eyes:     Pupils: Pupils are equal, round, and reactive to light.  Cardiovascular:     Rate and Rhythm: Normal rate and regular rhythm.     Pulses: Normal pulses.  Pulmonary:     Effort: Pulmonary effort is normal. No respiratory distress.     Breath sounds: Normal breath sounds.  Abdominal:     General: Bowel sounds are normal.  Musculoskeletal: Normal range of motion.  Skin:  General: Skin is warm and dry.  Neurological:     General: No focal deficit present.     Mental Status: She is alert. Mental status is at baseline.  Psychiatric:        Mood and Affect: Mood normal.        Behavior: Behavior normal.        Thought Content: Thought content normal.      Disposition at Discharge: Discharge disposition: 01-Home or Self Care       Discharge Orders: Discharge Instructions    Discharge patient   Complete by:  As directed    Discharge disposition:  01-Home or Self Care   Discharge patient  date:  06/25/2018      Condition at Discharge:   Stable  Time spent on Discharge:  20 minutes  Signed:   [name]  APRN, MSN, FNP-C [facility] [address] [place], [place] [address] [phone]  06/25/2018, 3:45 PM

## 2018-06-25 NOTE — Progress Notes (Signed)
Patient admitted to the day infusion hospital for sickle cell pain. Patient initially reported left arm pain rated 10/10. For pain management, patient placed on Dilaudid PCA, given 15 mg Toradol and hydrated with IV fluids. At discharge, patient rated pain level at 4/10. Vital signs stable. Discharge instructions given. Patient alert, oriented and ambulatory at discharge.

## 2018-06-26 ENCOUNTER — Inpatient Hospital Stay (HOSPITAL_COMMUNITY)
Admission: EM | Admit: 2018-06-26 | Discharge: 2018-06-29 | DRG: 812 | Disposition: A | Discharge status: Home / Self Care | Payer: Medicaid Other | Attending: Internal Medicine | Admitting: Internal Medicine

## 2018-06-26 ENCOUNTER — Encounter (HOSPITAL_COMMUNITY): Payer: Self-pay

## 2018-06-26 ENCOUNTER — Other Ambulatory Visit: Payer: Self-pay

## 2018-06-26 DIAGNOSIS — Z832 Family history of diseases of the blood and blood-forming organs and certain disorders involving the immune mechanism: Secondary | ICD-10-CM | POA: Diagnosis not present

## 2018-06-26 DIAGNOSIS — L97811 Non-pressure chronic ulcer of other part of right lower leg limited to breakdown of skin: Secondary | ICD-10-CM | POA: Diagnosis present

## 2018-06-26 DIAGNOSIS — D72829 Elevated white blood cell count, unspecified: Secondary | ICD-10-CM | POA: Diagnosis present

## 2018-06-26 DIAGNOSIS — E876 Hypokalemia: Secondary | ICD-10-CM | POA: Diagnosis present

## 2018-06-26 DIAGNOSIS — G8929 Other chronic pain: Secondary | ICD-10-CM | POA: Diagnosis not present

## 2018-06-26 DIAGNOSIS — D5700 Hb-SS disease with crisis, unspecified: Secondary | ICD-10-CM | POA: Diagnosis present

## 2018-06-26 DIAGNOSIS — Z825 Family history of asthma and other chronic lower respiratory diseases: Secondary | ICD-10-CM

## 2018-06-26 DIAGNOSIS — Z8249 Family history of ischemic heart disease and other diseases of the circulatory system: Secondary | ICD-10-CM

## 2018-06-26 DIAGNOSIS — G894 Chronic pain syndrome: Secondary | ICD-10-CM | POA: Diagnosis present

## 2018-06-26 DIAGNOSIS — Z833 Family history of diabetes mellitus: Secondary | ICD-10-CM | POA: Diagnosis not present

## 2018-06-26 DIAGNOSIS — D638 Anemia in other chronic diseases classified elsewhere: Secondary | ICD-10-CM | POA: Diagnosis present

## 2018-06-26 LAB — CBC WITH DIFFERENTIAL/PLATELET
ABS IMMATURE GRANULOCYTES: 0.54 10*3/uL — AB (ref 0.00–0.07)
Basophils Absolute: 0.1 10*3/uL (ref 0.0–0.1)
Basophils Relative: 1 %
Eosinophils Absolute: 0.2 10*3/uL (ref 0.0–0.5)
Eosinophils Relative: 1 %
HCT: 19.2 % — ABNORMAL LOW (ref 36.0–46.0)
Hemoglobin: 6.4 g/dL — CL (ref 12.0–15.0)
Immature Granulocytes: 3 %
Lymphocytes Relative: 11 %
Lymphs Abs: 2.2 10*3/uL (ref 0.7–4.0)
MCH: 32 pg (ref 26.0–34.0)
MCHC: 33.3 g/dL (ref 30.0–36.0)
MCV: 96 fL (ref 80.0–100.0)
Monocytes Absolute: 2.6 10*3/uL — ABNORMAL HIGH (ref 0.1–1.0)
Monocytes Relative: 13 %
Neutro Abs: 14.3 10*3/uL — ABNORMAL HIGH (ref 1.7–7.7)
Neutrophils Relative %: 71 %
Platelets: 494 10*3/uL — ABNORMAL HIGH (ref 150–400)
RBC: 2 MIL/uL — ABNORMAL LOW (ref 3.87–5.11)
RDW: 26.3 % — ABNORMAL HIGH (ref 11.5–15.5)
WBC: 20.1 10*3/uL — ABNORMAL HIGH (ref 4.0–10.5)
nRBC: 17.7 % — ABNORMAL HIGH (ref 0.0–0.2)

## 2018-06-26 LAB — COMPREHENSIVE METABOLIC PANEL
ALK PHOS: 42 U/L (ref 38–126)
ALT: 15 U/L (ref 0–44)
AST: 24 U/L (ref 15–41)
Albumin: 4.8 g/dL (ref 3.5–5.0)
Anion gap: 10 (ref 5–15)
BUN: 10 mg/dL (ref 6–20)
CO2: 24 mmol/L (ref 22–32)
Calcium: 9.2 mg/dL (ref 8.9–10.3)
Chloride: 100 mmol/L (ref 98–111)
Creatinine, Ser: 0.39 mg/dL — ABNORMAL LOW (ref 0.44–1.00)
GFR calc Af Amer: >60 mL/min (ref >60)
GFR calc non Af Amer: >60 mL/min (ref >60)
Glucose, Bld: 95 mg/dL (ref 70–99)
Potassium: 3 mmol/L — ABNORMAL LOW (ref 3.5–5.1)
Sodium: 134 mmol/L — ABNORMAL LOW (ref 135–145)
Total Bilirubin: 6.7 mg/dL — ABNORMAL HIGH (ref 0.3–1.2)
Total Protein: 7.9 g/dL (ref 6.5–8.1)

## 2018-06-26 LAB — RETICULOCYTES
Immature Retic Fract: 45.6 % — ABNORMAL HIGH (ref 2.3–15.9)
RBC.: 2 MIL/uL — AB (ref 3.87–5.11)
Retic Count, Absolute: 559.4 10*3/uL — ABNORMAL HIGH (ref 19.0–186.0)
Retic Ct Pct: 28 % — ABNORMAL HIGH (ref 0.4–3.1)

## 2018-06-26 LAB — I-STAT BETA HCG BLOOD, ED (MC, WL, AP ONLY): I-stat hCG, quantitative: <5 m[IU]/mL (ref <5)

## 2018-06-26 MED ORDER — HYDROMORPHONE HCL 2 MG/ML IJ SOLN
2.0000 mg | INTRAMUSCULAR | Status: AC
  Administered 2018-06-26: 2 mg via INTRAVENOUS

## 2018-06-26 MED ORDER — SENNOSIDES-DOCUSATE SODIUM 8.6-50 MG PO TABS
1.0000 | ORAL_TABLET | Freq: Two times a day (BID) | ORAL | Status: DC
  Administered 2018-06-27 – 2018-06-29 (×6): 1 via ORAL
  Filled 2018-06-26 (×6): qty 1

## 2018-06-26 MED ORDER — HYDROMORPHONE 1 MG/ML IV SOLN
INTRAVENOUS | Status: DC
  Administered 2018-06-27: 0.5 mg via INTRAVENOUS
  Administered 2018-06-27: 7.5 mg via INTRAVENOUS
  Administered 2018-06-27: 4.8 mg via INTRAVENOUS
  Administered 2018-06-27: 30 mg via INTRAVENOUS
  Filled 2018-06-26: qty 30

## 2018-06-26 MED ORDER — POTASSIUM CHLORIDE 10 MEQ/100ML IV SOLN: 10.0000 meq | Freq: Once | INTRAVENOUS | Status: DC

## 2018-06-26 MED ORDER — HYDROMORPHONE HCL 2 MG/ML IJ SOLN: 2.0000 mg | INTRAMUSCULAR | Status: AC

## 2018-06-26 MED ORDER — DEXTROSE-NACL 5-0.45 % IV SOLN
INTRAVENOUS | Status: AC
  Administered 2018-06-27 (×2): via INTRAVENOUS

## 2018-06-26 MED ORDER — HYDROMORPHONE HCL 2 MG/ML IJ SOLN: 2.0000 mg | INTRAMUSCULAR | Status: DC

## 2018-06-26 MED ORDER — HYDROMORPHONE HCL 2 MG/ML IJ SOLN
2.0000 mg | INTRAMUSCULAR | Status: AC
  Administered 2018-06-26: 2 mg via INTRAVENOUS
  Filled 2018-06-26: qty 1

## 2018-06-26 MED ORDER — ONDANSETRON HCL 4 MG/2ML IJ SOLN: 4.0000 mg | Freq: Four times a day (QID) | INTRAMUSCULAR | Status: DC | PRN

## 2018-06-26 MED ORDER — ENOXAPARIN SODIUM 40 MG/0.4ML ~~LOC~~ SOLN
40.0000 mg | Freq: Every day | SUBCUTANEOUS | Status: DC
  Administered 2018-06-27 – 2018-06-29 (×3): 40 mg via SUBCUTANEOUS
  Filled 2018-06-26 (×2): qty 0.4

## 2018-06-26 MED ORDER — HYDROMORPHONE HCL 2 MG/ML IJ SOLN
2.0000 mg | INTRAMUSCULAR | Status: AC
  Filled 2018-06-26: qty 1

## 2018-06-26 MED ORDER — NALOXONE HCL 0.4 MG/ML IJ SOLN: 0.4000 mg | INTRAMUSCULAR | Status: DC | PRN

## 2018-06-26 MED ORDER — L-GLUTAMINE ORAL POWDER
5.0000 g | PACK | Freq: Two times a day (BID) | ORAL | Status: DC
  Administered 2018-06-27 – 2018-06-29 (×6): 5 g via ORAL
  Filled 2018-06-26 (×6): qty 1

## 2018-06-26 MED ORDER — DIPHENHYDRAMINE HCL 50 MG/ML IJ SOLN
25.0000 mg | Freq: Once | INTRAMUSCULAR | Status: AC
  Administered 2018-06-26: 25 mg via INTRAVENOUS
  Filled 2018-06-26: qty 1

## 2018-06-26 MED ORDER — FOLIC ACID 1 MG PO TABS
5.0000 mg | ORAL_TABLET | Freq: Every day | ORAL | Status: DC
  Administered 2018-06-27 – 2018-06-29 (×3): 5 mg via ORAL
  Filled 2018-06-26 (×3): qty 5

## 2018-06-26 MED ORDER — SODIUM CHLORIDE 0.9% FLUSH
3.0000 mL | Freq: Once | INTRAVENOUS | Status: AC
  Administered 2018-06-26: 3 mL via INTRAVENOUS

## 2018-06-26 MED ORDER — POTASSIUM CHLORIDE CRYS ER 20 MEQ PO TBCR
40.0000 meq | EXTENDED_RELEASE_TABLET | Freq: Once | ORAL | Status: AC
  Administered 2018-06-26: 40 meq via ORAL
  Filled 2018-06-26: qty 2

## 2018-06-26 MED ORDER — KETOROLAC TROMETHAMINE 15 MG/ML IJ SOLN
15.0000 mg | INTRAMUSCULAR | Status: AC
  Administered 2018-06-26: 15 mg via INTRAVENOUS
  Filled 2018-06-26: qty 1

## 2018-06-26 MED ORDER — POLYETHYLENE GLYCOL 3350 17 G PO PACK: 17.0000 g | PACK | Freq: Every day | ORAL | Status: DC | PRN

## 2018-06-26 MED ORDER — KETOROLAC TROMETHAMINE 15 MG/ML IJ SOLN
15.0000 mg | Freq: Four times a day (QID) | INTRAMUSCULAR | Status: AC
  Administered 2018-06-27 (×5): 15 mg via INTRAVENOUS
  Filled 2018-06-26 (×5): qty 1

## 2018-06-26 MED ORDER — SODIUM CHLORIDE 0.9% FLUSH: 9.0000 mL | INTRAVENOUS | Status: DC | PRN

## 2018-06-26 NOTE — Progress Notes (Signed)
CRITICAL VALUE ALERT  Critical Value:  Hemoglobin 6.4  Date & Time Notied:  2240    06/26/18  Provider Notified: [place] PA  Orders Received/Actions taken: none at this time

## 2018-06-26 NOTE — ED Notes (Signed)
ED TO INPATIENT HANDOFF REPORT  Name/Age/Gender [name] 48 y.o. female  Code Status    Code Status Orders  (From admission, onward)         Start     Ordered   06/26/18 2338  Full code  Continuous     06/26/18 2338        Code Status History    Date Active Date Inactive Code Status Order ID Comments User Context   06/25/2018 1007 06/25/2018 1922 Full Code [id number]  [name], [place] Inpatient   04/07/2018 0932 04/07/2018 1920 Full Code [id number]  [name], FNP Inpatient   03/20/2017 1707 05/07/2017 1537 Full Code [id number]  Aletha Halim, MD Inpatient   03/20/2017 1704 03/20/2017 1707 Full Code [id number]  Aletha Halim, MD Inpatient   03/02/2017 1556 03/11/2017 2216 Full Code [id number]  Leana Gamer, MD Inpatient   11/09/2016 0345 11/14/2016 1947 Full Code [id number]  Edwin Dada, MD Inpatient   09/09/2016 1622 09/15/2016 1634 Full Code [id number]  Sedalia Muta, [place] Inpatient   08/16/2016 0646 08/18/2016 1858 Full Code [id number]  Edwin Dada, MD Inpatient   05/12/2016 0933 05/14/2016 1934 Full Code [id number]  Tresa Garter, MD ED   04/03/2016 0836 04/03/2016 1930 Full Code [id number]  Tresa Garter, MD Inpatient   04/02/2016 1119 04/02/2016 2033 Full Code [id number]  Tresa Garter, MD Inpatient   02/14/2015 1338 02/15/2015 1024 Full Code [id number]  Irene Limbo, MD Inpatient   01/06/2014 2347 01/09/2014 1647 Full Code [id number]  Emily Filbert, MD Inpatient   01/06/2014 1518 01/06/2014 2241 Full Code [id number]  Lorene Dy, CNM Inpatient   07/15/2013 1201 07/16/2013 [place] Full Code [id number]  RamaVenetia Maxon, MD Inpatient   10/10/2012 0851 10/20/2012 2229 Full Code [id number]  Rise Patience, MD Inpatient   04/01/2012 1618 04/06/2012 1221 Full Code [id number]  Leana Gamer, MD Inpatient   03/30/2012 0323 04/01/2012 1618 Full Code [id number]  Andreas Blower, RN Inpatient       Home/SNF/Other Home  Chief Complaint sickle cell; arm pain  Level of Care/Admitting Diagnosis ED Disposition    ED Disposition Condition [facility] Area: [facility] [[id number]]  Level of Care: Med-Surg [16]  Diagnosis: Sickle cell crisis [facility]) [[id number]]  Admitting Physician: Rise Patience [phone]  Attending Physician: Rise Patience [phone]  Estimated length of stay: past midnight tomorrow  Certification:: I certify this patient will need inpatient services for at least 2 midnights  PT Class (Do Not Modify): Inpatient [101]  PT Acc Code (Do Not Modify): Private [1]       Medical History Past Medical History:  Diagnosis Date  . Anemia 03/30/2012   Hx of sickle cell disease  . CAP (community acquired pneumonia)    2014  . Cholelithiasis 03/30/2012  . Chronic wound of extremity 04/01/2012  . Elevated LFTs   . Leg ulcer ([place]) 10/27/2012   Chronic under care of wound clinic  . Multiple open wounds of lower extremity    chronic wounds B/LLE  . Sickle cell disease ([place])   . Sinus bradycardia by electrocardiogram 04/03/2012    Allergies No Known Allergies  IV Location/Drains/Wounds Patient Lines/Drains/Airways Status   Active Line/Drains/Airways    Name:   Placement date:   Placement time:   Site:   Days:   Peripheral IV 06/26/18 Right;Posterior;Distal Forearm   06/26/18    2117    Forearm  less than 1   Incision (Closed) 01/06/14 Perineum Other (Comment)   01/06/14    1807     1632   Incision (Closed) 02/14/15 Ankle Right   02/14/15    1010     1228   Incision (Closed) 10/09/15 Leg Right   10/09/15    0824     991   Incision (Closed) 06/17/16 Leg Bilateral   06/17/16    1343     739   Incision (Closed) 05/04/17 Abdomen   05/04/17    2009     418   Incision (Closed) 05/04/17 Vagina   05/04/17    2012     418   Wound / Incision (Open or Dehisced) 05/12/16 Venous stasis ulcer Ankle Right;Medial   05/12/16    1050    Ankle    775   Wound / Incision (Open or Dehisced) 05/12/16 Venous stasis ulcer Ankle Left;Medial   05/12/16    1050    Ankle   775   Wound / Incision (Open or Dehisced) 09/09/16 Venous stasis ulcer Ankle Right;Lateral   09/09/16    1700    Ankle   655   Wound / Incision (Open or Dehisced) 09/09/16 Venous stasis ulcer Ankle Right;Medial 9cm X 5.5 CM   09/09/16    1700    Ankle   655          Labs/Imaging Results for orders placed or performed during the hospital encounter of 06/26/18 (from the past 48 hour(s))  Comprehensive metabolic panel     Status: Abnormal   Collection Time: 06/26/18  8:36 PM  Result Value Ref Range   Sodium 134 (L) 135 - 145 mmol/L   Potassium 3.0 (L) 3.5 - 5.1 mmol/L   Chloride 100 98 - 111 mmol/L   CO2 24 22 - 32 mmol/L   Glucose, Bld 95 70 - 99 mg/dL   BUN 10 6 - 20 mg/dL   Creatinine, Ser 0.39 (L) 0.44 - 1.00 mg/dL   Calcium 9.2 8.9 - 10.3 mg/dL   Total Protein 7.9 6.5 - 8.1 g/dL   Albumin 4.8 3.5 - 5.0 g/dL   AST 24 15 - 41 U/L   ALT 15 0 - 44 U/L   Alkaline Phosphatase 42 38 - 126 U/L   Total Bilirubin 6.7 (H) 0.3 - 1.2 mg/dL   GFR calc non Af Amer >60 >60 mL/min   GFR calc Af Amer >60 >60 mL/min   Anion gap 10 5 - 15    Comment: Performed at [facility], [place] [address], [place], [place] [address]  CBC with Differential     Status: Abnormal   Collection Time: 06/26/18  8:36 PM  Result Value Ref Range   WBC 20.1 (H) 4.0 - 10.5 K/uL   RBC 2.00 (L) 3.87 - 5.11 MIL/uL   Hemoglobin 6.4 (LL) 12.0 - 15.0 g/dL    Comment: This critical result has verified and been called to [place] by Colman Cater on 02 07 2020 at 2233, and has been read back. Critical resuts verified   HCT 19.2 (L) 36.0 - 46.0 %   MCV 96.0 80.0 - 100.0 fL   MCH 32.0 26.0 - 34.0 pg   MCHC 33.3 30.0 - 36.0 g/dL   RDW 26.3 (H) 11.5 - 15.5 %   Platelets 494 (H) 150 - 400 K/uL   nRBC 17.7 (H) 0.0 - 0.2 %   Neutrophils Relative % 71 %  Neutro Abs 14.3 (H) 1.7 - 7.7 K/uL    Lymphocytes Relative 11 %   Lymphs Abs 2.2 0.7 - 4.0 K/uL   Monocytes Relative 13 %   Monocytes Absolute 2.6 (H) 0.1 - 1.0 K/uL   Eosinophils Relative 1 %   Eosinophils Absolute 0.2 0.0 - 0.5 K/uL   Basophils Relative 1 %   Basophils Absolute 0.1 0.0 - 0.1 K/uL   Immature Granulocytes 3 %   Abs Immature Granulocytes 0.54 (H) 0.00 - 0.07 K/uL   Polychromasia PRESENT    Target Cells PRESENT     Comment: Performed at [facility], [place] [address] [place], [place] [address]  Reticulocytes     Status: Abnormal   Collection Time: 06/26/18  8:36 PM  Result Value Ref Range   Retic Ct Pct 28.0 (H) 0.4 - 3.1 %   RBC. 2.00 (L) 3.87 - 5.11 MIL/uL   Retic Count, Absolute 559.4 (H) 19.0 - 186.0 K/uL   Immature Retic Fract 45.6 (H) 2.3 - 15.9 %    Comment: Performed at [facility], [place] [address] [place], [place] [address]  I-Stat beta hCG blood, ED     Status: None   Collection Time: 06/26/18  9:20 PM  Result Value Ref Range   I-stat hCG, quantitative <5.0 <5 mIU/mL   Comment 3            Comment:   GEST. AGE      CONC.  (mIU/mL)   <=1 WEEK        5 - 50     2 WEEKS       50 - 500     3 WEEKS       100 - 10,000     4 WEEKS     1,000 - 30,000        FEMALE AND NON-PREGNANT FEMALE:     LESS THAN 5 mIU/mL    No results found. None  Pending Labs Unresulted Labs (From admission, onward)    Start     Ordered   07/03/18 0500  Creatinine, serum  (enoxaparin (LOVENOX)    CrCl >/= 30 ml/min)  Weekly,   R    Comments:  while on enoxaparin therapy    06/26/18 2338   06/27/18 0500  HIV antibody (Routine Testing)  Tomorrow morning,   R     06/26/18 2338   06/26/18 2338  CBC  (enoxaparin (LOVENOX)    CrCl >/= 30 ml/min)  Once,   R    Comments:  Baseline for enoxaparin therapy IF NOT ALREADY DRAWN.  Notify MD if PLT < 100 K.    06/26/18 2338   06/26/18 2338  Creatinine, serum  (enoxaparin (LOVENOX)    CrCl >/= 30 ml/min)  Once,   R    Comments:  Baseline  for enoxaparin therapy IF NOT ALREADY DRAWN.    06/26/18 2338          Vitals/Pain Today's Vitals   06/26/18 2222 06/26/18 2234 06/26/18 2256 06/26/18 2344  BP:   139/80   Pulse:  63 63   Resp:  17 17   Temp:      TempSrc:      SpO2:   98%   Weight: 58.1 kg     Height: 5\' 7"  (1.702 m)     PainSc:    8     Isolation Precautions No active isolations  Medications Medications  folic acid (FOLVITE) tablet 5 mg (  has no administration in time range)  L-glutamine (ENDARI) Powder Packet 5 g (has no administration in time range)  senna-docusate (Senokot-S) tablet 1 tablet (has no administration in time range)  polyethylene glycol (MIRALAX / GLYCOLAX) packet 17 g (has no administration in time range)  enoxaparin (LOVENOX) injection 40 mg (has no administration in time range)  ketorolac (TORADOL) 15 MG/ML injection 15 mg (has no administration in time range)  dextrose 5 %-0.45 % sodium chloride infusion (has no administration in time range)  naloxone (NARCAN) injection 0.4 mg (has no administration in time range)    And  sodium chloride flush (NS) 0.9 % injection 9 mL (has no administration in time range)  ondansetron (ZOFRAN) injection 4 mg (has no administration in time range)  HYDROmorphone (DILAUDID) 1 mg/mL PCA injection (has no administration in time range)  sodium chloride flush (NS) 0.9 % injection 3 mL (3 mLs Intravenous Given 06/26/18 2222)  ketorolac (TORADOL) 15 MG/ML injection 15 mg (15 mg Intravenous Given 06/26/18 2123)  HYDROmorphone (DILAUDID) injection 2 mg (2 mg Intravenous Given 06/26/18 2149)    Or  HYDROmorphone (DILAUDID) injection 2 mg ( Subcutaneous See Alternative 06/26/18 2149)  HYDROmorphone (DILAUDID) injection 2 mg (2 mg Intravenous Given 06/26/18 2123)    Or  HYDROmorphone (DILAUDID) injection 2 mg ( Subcutaneous See Alternative 06/26/18 2123)  HYDROmorphone (DILAUDID) injection 2 mg (2 mg Intravenous Given 06/26/18 2251)    Or  HYDROmorphone (DILAUDID) injection  2 mg ( Subcutaneous See Alternative 06/26/18 2251)  diphenhydrAMINE (BENADRYL) injection 25 mg (25 mg Intravenous Given 06/26/18 2220)  potassium chloride SA (K-DUR,KLOR-CON) CR tablet 40 mEq (40 mEq Oral Given 06/26/18 2251)    Mobility walks

## 2018-06-26 NOTE — H&P (Signed)
History and Physical    Deltha Bernales OHY:[id number] DOB: Sep 30, 1970 DOA: 06/26/2018  PCP: [name], FNP  Patient coming from: Home.  Chief Complaint: Left upper extremity pain.  HPI: [name] is a 48 y.o. female with history of sickle cell anemia presents to the ER with complaint of left upper extremity pain for the last 3 to 4 days typical of her sickle cell pain crisis.  Denies any fever chills hypoxia headache visual symptoms or any focal deficits.  Patient takes oxycodone as needed for pain.  Patient also has a chronic ulceration of the lower extremity for which she follows with wound care.    ED Course: In the ER patient was afebrile.  No obvious swelling in the extremities.  Has chronic wound of the lower extremity.  Patient was not hypoxic.  Despite giving multiple doses of pain medication patient still in pain admitted for further management.  Review of Systems: As per HPI, rest all negative.   Past Medical History:  Diagnosis Date  . Anemia 03/30/2012   Hx of sickle cell disease  . CAP (community acquired pneumonia)    2014  . Cholelithiasis 03/30/2012  . Chronic wound of extremity 04/01/2012  . Elevated LFTs   . Leg ulcer ([place]) 10/27/2012   Chronic under care of wound clinic  . Multiple open wounds of lower extremity    chronic wounds B/LLE  . Sickle cell disease ([place])   . Sinus bradycardia by electrocardiogram 04/03/2012    Past Surgical History:  Procedure Laterality Date  . ALLOGRAFT APPLICATION Right 9/48/5462   Procedure: SURGICAL PREP FOR GRAFTING RIGHT LOWER EXTREMITY AND APPLICATION OF Jannifer Hick;  Surgeon: Irene Limbo, MD;  Location: [place];  Service: Plastics;  Laterality: Right;  . APPLICATION OF A-CELL OF EXTREMITY Right 10/09/2015   Procedure: APPLICATION OF Jannifer Hick;  Surgeon: Irene Limbo, MD;  Location: [place];  Service: Plastics;  Laterality: Right;  . BREAST SURGERY     left breast cyst  aspiration  . CESAREAN SECTION    . CESAREAN SECTION N/A 01/06/2014   Procedure: CESAREAN SECTION;  Surgeon: Emily Filbert, MD;  Location: [facility];  Service: Obstetrics;  Laterality: N/A;  . CESAREAN SECTION N/A 05/04/2017   Procedure: CESAREAN SECTION;  Surgeon: Woodroe Mode, MD;  Location: [place];  Service: Obstetrics;  Laterality: N/A;  . I&D EXTREMITY Right 10/09/2015   Procedure: SURGICAL PREPARATION FOR GRAFTING RIGHT ANKLE AND APPLICATION THERASKIN;  Surgeon: Irene Limbo, MD;  Location: [place];  Service: Plastics;  Laterality: Right;  . SKIN FULL THICKNESS GRAFT Bilateral 06/17/2016   Procedure: SURGICAL PREP FOR GRAFTING, BILATERAL LOWER EXTREMITIES AND APPLICATION OF Jannifer Hick;  Surgeon: Irene Limbo, MD;  Location: [place];  Service: Plastics;  Laterality: Bilateral;  . TONSILLECTOMY       reports that she has never smoked. She has never used smokeless tobacco. She reports that she does not drink alcohol or use drugs.  No Known Allergies  Family History  Problem Relation Age of Onset  . Sickle cell anemia Sister   . Diabetes Mother   . Asthma Mother   . Sickle cell trait Mother   . Sickle cell trait Father   . Hypertension Father     Prior to Admission medications   Medication Sig Start Date End Date Taking? Authorizing Provider  Cholecalciferol (VITAMIN D) 2000 units CAPS Take 2 capsules (4,000 Units total) by mouth daily. 02/02/18  Yes [name], [place]  folic acid (FOLVITE) 1 MG tablet Take 5 tablets (5 mg total) by mouth daily. 01/15/17  Yes Anyanwu, Sallyanne Havers, MD  ibuprofen (ADVIL,MOTRIN) 600 MG tablet Take 1 tablet (600 mg total) by mouth every 6 (six) hours. 04/20/18  Yes [name], FNP  L-glutamine (ENDARI) 5 g PACK Powder Packet Take 5 g by mouth 2 (two) times daily. 04/07/18  Yes [name], FNP  Oxycodone HCl 10 MG TABS Take 1 tablet (10 mg total) by mouth every 4 (four) hours as needed for up to 15 days (pain). 06/23/18  07/08/18 Yes [name], FNP  fluticasone (FLONASE) 50 MCG/ACT nasal spray Place 2 sprays into both nostrils daily. Patient not taking: Reported on 02/02/2018 11/26/17   [name], FNP    Physical Exam: Vitals:   06/26/18 2139 06/26/18 2222 06/26/18 2234 06/26/18 2256  BP: 135/66   139/80  Pulse: 62  63 63  Resp: 16  17 17   Temp:      TempSrc:      SpO2: 99%   98%  Weight:  58.1 kg    Height:  5\' 7"  (1.702 m)        Constitutional: Moderately built and nourished. Vitals:   06/26/18 2139 06/26/18 2222 06/26/18 2234 06/26/18 2256  BP: 135/66   139/80  Pulse: 62  63 63  Resp: 16  17 17   Temp:      TempSrc:      SpO2: 99%   98%  Weight:  58.1 kg    Height:  5\' 7"  (1.702 m)     Eyes: Anicteric no pallor. ENMT: No discharge from the ears eyes nose or mouth. Neck: No mass felt.  No neck rigidity. Respiratory: No rhonchi or crepitations. Cardiovascular: S1-S2 heard. Abdomen: Soft nontender bowel sounds present. Musculoskeletal: Has wound on both lateral and medial malleolus of the right lower extremity no active discharge. Skin: Skin ulceration of both medial and lateral malleolus of the right lower extremity. Neurologic: Alert awake oriented to time place and person.  Moves all extremities. Psychiatric: Appears normal per normal affect.   Labs on Admission: I have personally reviewed following labs and imaging studies  CBC: Recent Labs  Lab 06/25/18 1115 06/26/18 2036  WBC 16.2* 20.1*  NEUTROABS 11.4* 14.3*  HGB 7.0* 6.4*  HCT 21.2* 19.2*  MCV 93.0 96.0  PLT 567* 568*   Basic Metabolic Panel: Recent Labs  Lab 06/25/18 1115 06/26/18 2036  NA 138 134*  K 3.3* 3.0*  CL 105 100  CO2 24 24  GLUCOSE 105* 95  BUN 8 10  CREATININE 0.36* 0.39*  CALCIUM 9.3 9.2   GFR: Estimated Creatinine Clearance: 80.6 mL/min (A) (by C-G formula based on SCr of 0.39 mg/dL (L)). Liver Function Tests: Recent Labs  Lab 06/25/18 1115 06/26/18 2036  AST 22 24  ALT 14  15  ALKPHOS 40 42  BILITOT 6.1* 6.7*  PROT 7.7 7.9  ALBUMIN 4.8 4.8   No results for input(s): LIPASE, AMYLASE in the last 168 hours. No results for input(s): AMMONIA in the last 168 hours. Coagulation Profile: No results for input(s): INR, PROTIME in the last 168 hours. Cardiac Enzymes: No results for input(s): CKTOTAL, CKMB, CKMBINDEX, TROPONINI in the last 168 hours. BNP (last 3 results) No results for input(s): PROBNP in the last 8760 hours. HbA1C: No results for input(s): HGBA1C in the last 72 hours. CBG: No results for input(s): GLUCAP in the last 168 hours. Lipid Profile: No results for input(s): CHOL,  HDL, LDLCALC, TRIG, CHOLHDL, LDLDIRECT in the last 72 hours. Thyroid Function Tests: No results for input(s): TSH, T4TOTAL, FREET4, T3FREE, THYROIDAB in the last 72 hours. Anemia Panel: Recent Labs    06/25/18 1115 06/26/18 2036  RETICCTPCT 24.4* 28.0*   Urine analysis:    Component Value Date/Time   COLORURINE YELLOW 04/07/2018 [place] 04/07/2018 1433   LABSPEC 1.012 04/07/2018 1433   PHURINE 5.0 04/07/2018 1433   GLUCOSEU NEGATIVE 04/07/2018 1433   HGBUR SMALL (A) 04/07/2018 1433   BILIRUBINUR NEGATIVE 04/07/2018 1433   BILIRUBINUR negative 04/06/2018 1059   BILIRUBINUR neg 03/02/2018 1136   KETONESUR NEGATIVE 04/07/2018 1433   PROTEINUR 30 (A) 04/07/2018 1433   UROBILINOGEN 1.0 04/06/2018 1059   UROBILINOGEN 0.2 05/14/2017 1144   NITRITE NEGATIVE 04/07/2018 1433   LEUKOCYTESUR NEGATIVE 04/07/2018 1433   Sepsis Labs: @LABRCNTIP (procalcitonin:4,lacticidven:4) )No results found for this or any previous visit (from the past 240 hour(s)).   Radiological Exams on Admission: No results found.    Assessment/Plan Principal Problem:   Sickle cell pain crisis ([place]) Active Problems:   Sickle cell crisis ([place])    1. Sickle cell pain crisis -mostly affecting the left upper extremity.  No swelling on the extremities and has good pulse.   Patient placed on Dilaudid PCA.  If pain does not improve may change to weight-based PCA. 2. Anemia secondary sickle cell disease follow CBC. 3. Chronic lower extremity wound does not look infected will get wound team consult.  Patient afebrile. 4. Mild hypokalemia replace and recheck.   DVT prophylaxis: Lovenox. Code Status: Full code. Family Communication: Discussed with patient. Disposition Plan: Home. Consults called: Wound team. Admission status: Inpatient.   Rise Patience MD [facility] Pager [phone].  If 7PM-7AM, please contact night-coverage [email or web address] Password [facility]  06/26/2018, 11:39 PM

## 2018-06-26 NOTE — ED Triage Notes (Signed)
Pt reports L arm pain r/t sickle cell. She states that she was at the sickle cell clinic yesterday. Denies SOB or chest pain.

## 2018-06-26 NOTE — ED Provider Notes (Addendum)
[facility] DEPT Provider Note   CSN: [id number] Arrival date & time: 06/26/18  2010     History   Chief Complaint Chief Complaint  Patient presents with  . Sickle Cell Pain Crisis    HPI [name] is a 48 y.o. female w PMHx sickle cell anemia, chronic pain syndrome, presenting to the ED with complaint of sickle cell pain located left arm.  Patient was treated at the sickle cell clinic yesterday for sickle cell pain with improvement.  She was discharged home, however states the pain returned in the evening and has not been well enough improved by her home medications today.  She has been taking prescribed 10 mg of oxycodone every 4 hours.  She also took 600mg  of ibuprofen.  Last dose of both of these was 6 PM this evening.  Patient denies associated fever, chills, chest pain, shortness of breath, abdominal complaints, urinary symptoms.  She states her pain tends to be located in different locations, however has been similarly located in the left arm.  The history is provided by the patient and medical records.    Past Medical History:  Diagnosis Date  . Anemia 03/30/2012   Hx of sickle cell disease  . CAP (community acquired pneumonia)    2014  . Cholelithiasis 03/30/2012  . Chronic wound of extremity 04/01/2012  . Elevated LFTs   . Leg ulcer ([place]) 10/27/2012   Chronic under care of wound clinic  . Multiple open wounds of lower extremity    chronic wounds B/LLE  . Sickle cell disease ([place])   . Sinus bradycardia by electrocardiogram 04/03/2012    Patient Active Problem List   Diagnosis Date Noted  . Sickle cell pain crisis ([place]) 04/07/2018  . Avascular necrosis of bone of hip, left ([place]) 12/08/2017  . Avascular necrosis of bone of hip, right ([place]) 12/08/2017  . Hb-SS disease without crisis ([place]) 11/26/2017  . Anemia 11/26/2017  . Abnormal quad screen   . Ringworm of body 04/05/2017  . Ulcer of right lower extremity ([place])   . 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 History of pre-eclampsia 12/16/2016  . Previous cesarean delivery x 2 07/22/2013  . Hemochromatosis 11/10/2012  . Chronic pain syndrome 10/13/2012  . Leukocytosis 03/30/2012    Past Surgical History:  Procedure Laterality Date  . ALLOGRAFT APPLICATION Right 6/96/7893   Procedure: SURGICAL PREP FOR GRAFTING RIGHT LOWER EXTREMITY AND APPLICATION OF Jannifer Hick;  Surgeon: Irene Limbo, MD;  Location: [facility];  Service: Plastics;  Laterality: Right;  . APPLICATION OF A-CELL OF EXTREMITY Right 10/09/2015   Procedure: APPLICATION OF Jannifer Hick;  Surgeon: Irene Limbo, MD;  Location: [facility];  Service: Plastics;  Laterality: Right;  . BREAST SURGERY     left breast cyst aspiration  . CESAREAN SECTION    . CESAREAN SECTION N/A 01/06/2014   Procedure: CESAREAN SECTION;  Surgeon: Emily Filbert, MD;  Location: [facility];  Service: Obstetrics;  Laterality: N/A;  . CESAREAN SECTION N/A 05/04/2017   Procedure: CESAREAN SECTION;  Surgeon: Woodroe Mode, MD;  Location: [facility];  Service: Obstetrics;  Laterality: N/A;  . I&D EXTREMITY Right 10/09/2015   Procedure: SURGICAL PREPARATION FOR GRAFTING RIGHT ANKLE AND APPLICATION THERASKIN;  Surgeon: Irene Limbo, MD;  Location: [facility];  Service: Plastics;  Laterality: Right;  . SKIN FULL THICKNESS GRAFT Bilateral 06/17/2016   Procedure: SURGICAL PREP FOR GRAFTING, BILATERAL LOWER EXTREMITIES AND APPLICATION OF  Jannifer Hick;  Surgeon: Irene Limbo, MD;  Location: [facility];  Service: Plastics;  Laterality: Bilateral;  . TONSILLECTOMY       OB History    Gravida  8   Para  5   Term  1   Preterm  4   AB  3   Living  3     SAB  3   TAB      Ectopic      Multiple  0   Live Births  4            Home Medications    Prior to Admission medications   Medication Sig Start Date End Date Taking? Authorizing Provider    Cholecalciferol (VITAMIN D) 2000 units CAPS Take 2 capsules (4,000 Units total) by mouth daily. 02/02/18   [name], FNP  Clobetasol Prop Emollient Base (CLOBETASOL PROPIONATE E) 0.05 % emollient cream Apply 1 application topically 2 (two) times daily. Patient not taking: Reported on 05/14/2017 12/20/16   [name], FNP  fluticasone [facility]) 50 MCG/ACT nasal spray Place 2 sprays into both nostrils daily. Patient not taking: Reported on 02/02/2018 11/26/17   [name], FNP  folic acid (FOLVITE) 1 MG tablet Take 5 tablets (5 mg total) by mouth daily. 01/15/17   Anyanwu, Sallyanne Havers, MD  ibuprofen (ADVIL,MOTRIN) 600 MG tablet Take 1 tablet (600 mg total) by mouth every 6 (six) hours. 04/20/18   [name], FNP  L-glutamine (ENDARI) 5 g PACK Powder Packet Take 5 g by mouth 2 (two) times daily. 04/07/18   [name], FNP  Oxycodone HCl 10 MG TABS Take 1 tablet (10 mg total) by mouth every 4 (four) hours as needed for up to 15 days (pain). 06/23/18 07/08/18  [name], FNP    Family History Family History  Problem Relation Age of Onset  . Sickle cell anemia Sister   . Diabetes Mother   . Asthma Mother   . Sickle cell trait Mother   . Sickle cell trait Father   . Hypertension Father     Social History Social History   Tobacco Use  . Smoking status: Never Smoker  . Smokeless tobacco: Never Used  Substance Use Topics  . Alcohol use: No  . Drug use: No     Allergies   Patient has no known allergies.   Review of Systems Review of Systems  Constitutional: Negative for fever.  Respiratory: Negative for shortness of breath.   Cardiovascular: Negative for chest pain.  Gastrointestinal: Negative for abdominal pain, nausea and vomiting.  Genitourinary: Negative for dysuria and frequency.  Musculoskeletal: Positive for arthralgias and myalgias. Negative for neck pain.  Skin: Negative for color change.  All other systems reviewed and are negative.    Physical  Exam Updated Vital Signs BP (!) 151/92 (BP Location: Right Arm)   Pulse 70   Temp 98.9 F (37.2 C) (Oral)   Resp 16   LMP 06/23/2018   SpO2 92%   Physical Exam Vitals signs and nursing note reviewed.  Constitutional:      Appearance: She is well-developed. She is not ill-appearing.  HENT:     Head: Normocephalic and atraumatic.  Eyes:     General: Scleral icterus present.  Cardiovascular:     Rate and Rhythm: Normal rate and regular rhythm.     Heart sounds: Murmur present.  Pulmonary:     Effort: Pulmonary effort is normal.     Breath sounds: Normal breath sounds.  Abdominal:  General: Bowel sounds are normal.     Palpations: Abdomen is soft.     Tenderness: There is no abdominal tenderness.  Musculoskeletal:     Comments: Generalized tenderness to left arm.  No erythema, edema, or warmth.  Normal range of motion.  Skin:    General: Skin is warm.  Neurological:     Mental Status: She is alert.  Psychiatric:        Behavior: Behavior normal.      ED Treatments / Results  Labs (all labs ordered are listed, but only abnormal results are displayed) Labs Reviewed  COMPREHENSIVE METABOLIC PANEL  CBC WITH DIFFERENTIAL/PLATELET  RETICULOCYTES  I-STAT BETA HCG BLOOD, ED ([facility], [facility], [facility] ONLY)    EKG None  Radiology No results found.  Procedures Procedures (including critical care time) CRITICAL CARE Performed by: [place] N Crystalynn Mcinerney   Total critical care time: 35 minutes  Critical care time was exclusive of separately billable procedures and treating other patients.  Critical care was necessary to treat or prevent imminent or life-threatening deterioration.  Critical care was time spent personally by me on the following activities: development of treatment plan with patient and/or surrogate as well as nursing, discussions with consultants, evaluation of patient's response to treatment, examination of patient, obtaining history from patient or surrogate,  ordering and performing treatments and interventions, ordering and review of laboratory studies, ordering and review of radiographic studies, pulse oximetry and re-evaluation of patient's condition.  Medications Ordered in ED Medications  sodium chloride flush (NS) 0.9 % injection 3 mL (has no administration in time range)  ketorolac (TORADOL) 15 MG/ML injection 15 mg (has no administration in time range)  HYDROmorphone (DILAUDID) injection 2 mg (has no administration in time range)    Or  HYDROmorphone (DILAUDID) injection 2 mg (has no administration in time range)  HYDROmorphone (DILAUDID) injection 2 mg (has no administration in time range)    Or  HYDROmorphone (DILAUDID) injection 2 mg (has no administration in time range)  HYDROmorphone (DILAUDID) injection 2 mg (has no administration in time range)    Or  HYDROmorphone (DILAUDID) injection 2 mg (has no administration in time range)  HYDROmorphone (DILAUDID) injection 2 mg (has no administration in time range)    Or  HYDROmorphone (DILAUDID) injection 2 mg (has no administration in time range)     Initial Impression / Assessment and Plan / ED Course  I have reviewed the triage vital signs and the nursing notes.  Pertinent labs & imaging results that were available during my care of the patient were reviewed by me and considered in my medical decision making (see chart for details).  Clinical Course as of Jun 26 2318  Fri Jun 26, 2018  2238 Patient reevaluated after second dose of Dilaudid.  Reports mild improvement in symptoms, however still continues to rate her pain as 9/10 severity.  She is sleeping upon entering the room, and appears drowsy. Labs pending.   [JR]  2254 Per chart review, threshold for transfusion is hgb less than 6. Discussed with Dr. Eulis Foster. Labs consistent with sickle cell crisis. Will administer 3rd dose of pain medication per protocol and determine disposition.  CBC with Differential(!!) [JR]  2307 Patient  reports very little overall improvement in symptoms after pain medications per sickle cell protocol.  Hospitalist consulted for admission for pain control.   [JR]  2319 Dr. Hal Hope accepting admission   [JR]    Clinical Course User Index [JR] Ilamae Geng, [place] N, PA-C  Patient presenting with sickle cell crisis. Patient with typical symptoms and pain. Pt without CP, abdominal pain, or SOB. Pt is not exhibiting signs or symptoms of acute chest syndrome, organ failure, or DVT. Pt is afebrile, hemodynamically stable. WBC elev. Retic elevated. Hgb 6.4, down from 7 yesterday. Per chart review, pt's threshold for transfusion is <6. Potassium 3, orally replaced. Patient treated with sickle cell protocol, however without significant improvement in symptoms. Hospitalist consulted for admission for further management. Dr. Hal Hope accepting admission.  The patient appears reasonably stabilized for admission considering the current resources, flow, and capabilities available in the ED at this time, and I doubt any other [facility] requiring further screening and/or treatment in the ED prior to admission.  Final Clinical Impressions(s) / ED Diagnoses   Final diagnoses:  Sickle cell pain crisis [facility])    ED Discharge Orders    None       Arkie Tagliaferro, [place] N, PA-C 06/26/18 2323    Daleen Bo, MD 06/27/18 [place], [place] N, PA-C 07/18/18 1016    Daleen Bo, MD 07/18/18 1154

## 2018-06-27 LAB — COMPREHENSIVE METABOLIC PANEL
ALT: 15 U/L (ref 0–44)
ANION GAP: 8 (ref 5–15)
AST: 22 U/L (ref 15–41)
Albumin: 4.2 g/dL (ref 3.5–5.0)
Alkaline Phosphatase: 35 U/L — ABNORMAL LOW (ref 38–126)
BILIRUBIN TOTAL: 2.9 mg/dL — AB (ref 0.3–1.2)
BUN: 6 mg/dL (ref 6–20)
CALCIUM: 8.6 mg/dL — AB (ref 8.9–10.3)
CO2: 26 mmol/L (ref 22–32)
Chloride: 104 mmol/L (ref 98–111)
Creatinine, Ser: 0.35 mg/dL — ABNORMAL LOW (ref 0.44–1.00)
GFR calc Af Amer: >60 mL/min (ref >60)
GFR calc non Af Amer: >60 mL/min (ref >60)
Glucose, Bld: 142 mg/dL — ABNORMAL HIGH (ref 70–99)
Potassium: 3.3 mmol/L — ABNORMAL LOW (ref 3.5–5.1)
Sodium: 138 mmol/L (ref 135–145)
TOTAL PROTEIN: 6.7 g/dL (ref 6.5–8.1)

## 2018-06-27 LAB — CBC WITH DIFFERENTIAL/PLATELET
Abs Immature Granulocytes: 0.19 10*3/uL — ABNORMAL HIGH (ref 0.00–0.07)
Basophils Absolute: 0.1 10*3/uL (ref 0.0–0.1)
Basophils Relative: 1 %
Eosinophils Absolute: 0.6 10*3/uL — ABNORMAL HIGH (ref 0.0–0.5)
Eosinophils Relative: 4 %
HCT: 19.1 % — ABNORMAL LOW (ref 36.0–46.0)
Hemoglobin: 6.2 g/dL — CL (ref 12.0–15.0)
Immature Granulocytes: 1 %
LYMPHS PCT: 17 %
Lymphs Abs: 2.7 10*3/uL (ref 0.7–4.0)
MCH: 31 pg (ref 26.0–34.0)
MCHC: 32.5 g/dL (ref 30.0–36.0)
MCV: 95.5 fL (ref 80.0–100.0)
Monocytes Absolute: 2.4 10*3/uL — ABNORMAL HIGH (ref 0.1–1.0)
Monocytes Relative: 16 %
Neutro Abs: 9.3 10*3/uL — ABNORMAL HIGH (ref 1.7–7.7)
Neutrophils Relative %: 61 %
Platelets: 452 10*3/uL — ABNORMAL HIGH (ref 150–400)
RBC: 2 MIL/uL — AB (ref 3.87–5.11)
RDW: 25 % — ABNORMAL HIGH (ref 11.5–15.5)
WBC: 15.2 10*3/uL — AB (ref 4.0–10.5)
nRBC: 18.3 % — ABNORMAL HIGH (ref 0.0–0.2)

## 2018-06-27 LAB — HIV ANTIBODY (ROUTINE TESTING W REFLEX): HIV Screen 4th Generation wRfx: NONREACTIVE

## 2018-06-27 LAB — MRSA PCR SCREENING: MRSA by PCR: NEGATIVE

## 2018-06-27 MED ORDER — DIPHENHYDRAMINE HCL 12.5 MG/5ML PO ELIX
12.5000 mg | ORAL_SOLUTION | Freq: Four times a day (QID) | ORAL | Status: DC | PRN
  Administered 2018-06-28: 12.5 mg via ORAL
  Filled 2018-06-27: qty 5

## 2018-06-27 MED ORDER — POTASSIUM CHLORIDE CRYS ER 20 MEQ PO TBCR
40.0000 meq | EXTENDED_RELEASE_TABLET | Freq: Once | ORAL | Status: AC
  Administered 2018-06-27: 40 meq via ORAL
  Filled 2018-06-27: qty 2

## 2018-06-27 MED ORDER — LIDOCAINE 5 % EX OINT
TOPICAL_OINTMENT | Freq: Every day | CUTANEOUS | Status: DC | PRN
  Administered 2018-06-27 – 2018-06-29 (×2): via TOPICAL
  Filled 2018-06-27 (×2): qty 35.44

## 2018-06-27 MED ORDER — DIPHENHYDRAMINE HCL 50 MG/ML IJ SOLN: 12.5000 mg | Freq: Four times a day (QID) | INTRAMUSCULAR | Status: DC | PRN

## 2018-06-27 MED ORDER — ONDANSETRON HCL 4 MG/2ML IJ SOLN: 4.0000 mg | Freq: Four times a day (QID) | INTRAMUSCULAR | Status: DC | PRN

## 2018-06-27 MED ORDER — NALOXONE HCL 0.4 MG/ML IJ SOLN: 0.4000 mg | INTRAMUSCULAR | Status: DC | PRN

## 2018-06-27 MED ORDER — SODIUM CHLORIDE 0.9% FLUSH: 9.0000 mL | INTRAVENOUS | Status: DC | PRN

## 2018-06-27 MED ORDER — TRIAMCINOLONE ACETONIDE 0.1 % EX CREA
TOPICAL_CREAM | Freq: Every morning | CUTANEOUS | Status: DC
  Administered 2018-06-27 – 2018-06-29 (×3): via TOPICAL
  Filled 2018-06-27: qty 15

## 2018-06-27 MED ORDER — HYDROMORPHONE 1 MG/ML IV SOLN
INTRAVENOUS | Status: DC
  Administered 2018-06-27: 30 mg via INTRAVENOUS
  Administered 2018-06-27: 6.5 mg via INTRAVENOUS
  Administered 2018-06-27: 3 mg via INTRAVENOUS
  Administered 2018-06-27: 6.5 mg via INTRAVENOUS
  Administered 2018-06-28: 7.79 mg via INTRAVENOUS
  Administered 2018-06-28: 30 mg via INTRAVENOUS
  Administered 2018-06-28: 7.5 mg via INTRAVENOUS
  Administered 2018-06-28: 15 mg via INTRAVENOUS
  Administered 2018-06-28: 9.5 mg via INTRAVENOUS
  Administered 2018-06-29: 30 mg via INTRAVENOUS
  Administered 2018-06-29: 1.5 mg via INTRAVENOUS
  Administered 2018-06-29: 13.5 mg via INTRAVENOUS
  Administered 2018-06-29: 5.5 mg via INTRAVENOUS
  Filled 2018-06-27 (×3): qty 30

## 2018-06-27 NOTE — Progress Notes (Signed)
Venous stasis ulcer present on admission, lateral sides of right foot with dressing in placed. Old drainage. Per patient ulcer present times 3-4 years. Pt of [facility] and dressing changes also done at home.

## 2018-06-27 NOTE — Progress Notes (Signed)
CRITICAL VALUE ALERT  Critical Value: Hgb 6.2  Date & Time Notied:  06/27/2018 @1300   Provider Notified: Dr. Jonelle Sidle  Orders Received/Actions taken: No orders

## 2018-06-27 NOTE — Progress Notes (Signed)
RN admit note, pt received from ED at [facility] via wheelchair with 2L O2 in place. Ambulated from wheelchair (at door) to bed with assistance. Reports weakness, report pain in left arm 8/10, oriented to room, use of call bed/bed, reviewed plan of care. Verbalized understanding.Placed on falls precautions due to weakness and use of Dilaudid PCA. IVF and dilaudid started per order. Placed on bed alarm to maintain patient safety due to PCA. Pt verbalized understanding to call for help. VSS on admission. See flowsheet for skin assessment, pt. With venous stasis ulcer on lateral sides of right foot. MEWS green. NO s/s of distress. Will continue to monitor pt. Closely.

## 2018-06-27 NOTE — Plan of Care (Signed)
Pt with dilaudid PCA, no reported improvements to pain at this time. Will continue to monitor pt. [place]. Kjones RN

## 2018-06-27 NOTE — Progress Notes (Signed)
Subjective: Patient is a 48 year old female with known history of sickle cell disease that was admitted with sickle cell painful crisis.  Pain is mainly in the left forearm severe rated as 8 out of 10.  She is currently on Oxycodone IR with Toradol and Dilaudid PCA.  Pain has persisted.  No nausea vomiting or diarrhea.  Objective: Vital signs in last 24 hours: Temp:  [98 F (36.7 C)-98.9 F (37.2 C)] 98.6 F (37 C) (02/08 1004) Pulse Rate:  [58-80] 63 (02/08 1004) Resp:  [14-18] 14 (02/08 1004) BP: (117-160)/(64-92) 125/76 (02/08 1004) SpO2:  [90 %-100 %] 100 % (02/08 1004) Weight:  [58.1 kg] 58.1 kg (02/07 2222) Weight change:  Last BM Date: 06/26/18  Intake/Output from previous day: 02/07 0701 - 02/08 0700 In: 517.9 [P.O.:240; I.V.:277.9] Out: -  Intake/Output this shift: No intake/output data recorded.  General appearance: alert, cooperative, appears stated age and no distress Head: Normocephalic, without obvious abnormality, atraumatic Neck: no adenopathy, no carotid bruit, no JVD, supple, symmetrical, trachea midline and thyroid not enlarged, symmetric, no tenderness/mass/nodules Back: symmetric, no curvature. ROM normal. No CVA tenderness. Resp: clear to auscultation bilaterally Cardio: regular rate and rhythm, S1, S2 normal, no murmur, click, rub or gallop GI: soft, non-tender; bowel sounds normal; no masses,  no organomegaly Extremities: extremities normal, atraumatic, no cyanosis or edema Pulses: 2+ and symmetric Skin: Skin color, texture, turgor normal. No rashes or lesions Neurologic: Grossly normal  Lab Results: Recent Labs    06/26/18 2036 06/27/18 0950  WBC 20.1* 15.2*  HGB 6.4* 6.2*  HCT 19.2* 19.1*  PLT 494* 452*   BMET Recent Labs    06/26/18 2036 06/27/18 0950  NA 134* 138  K 3.0* 3.3*  CL 100 104  CO2 24 26  GLUCOSE 95 142*  BUN 10 6  CREATININE 0.39* 0.35*  CALCIUM 9.2 8.6*    Studies/Results: No results found.  Medications: I have  reviewed the patient's current medications.  Assessment/Plan: A 48 year old female admitted with sickle cell painful crisis.  #1 sickle cell painful crisis: Patient will be maintained on Dilaudid PCA.  Encouraged to use it more.  She has not used much in the last 24 hours.  Only about 8 mg.  Also on oral oxycodone PRN.  Will reassess pain.  Continue IV fluids.  #2 sickle cell anemia: Hemoglobin is at 6.2.  Monitored H&H closely.  #3 hypokalemia: Replete potassium again.  Monitor overnight.  #4 leukocytosis: Most likely due to vaso-occlusive crisis.  White count is slowly decreasing.  Continue to monitor   LOS: 1 day   Amyiah Gaba,LAWAL 06/27/2018, 12:21 PM

## 2018-06-27 NOTE — Plan of Care (Signed)
See admit note. Neomia Dear, RN

## 2018-06-28 LAB — CBC WITH DIFFERENTIAL/PLATELET
Abs Immature Granulocytes: 0.11 10*3/uL — ABNORMAL HIGH (ref 0.00–0.07)
BASOS ABS: 0.1 10*3/uL (ref 0.0–0.1)
Basophils Relative: 1 %
Eosinophils Absolute: 0.8 10*3/uL — ABNORMAL HIGH (ref 0.0–0.5)
Eosinophils Relative: 6 %
HCT: 20.5 % — ABNORMAL LOW (ref 36.0–46.0)
Hemoglobin: 6.7 g/dL — CL (ref 12.0–15.0)
Immature Granulocytes: 1 %
LYMPHS PCT: 16 %
Lymphs Abs: 2.1 10*3/uL (ref 0.7–4.0)
MCH: 31.5 pg (ref 26.0–34.0)
MCHC: 32.7 g/dL (ref 30.0–36.0)
MCV: 96.2 fL (ref 80.0–100.0)
Monocytes Absolute: 2.1 10*3/uL — ABNORMAL HIGH (ref 0.1–1.0)
Monocytes Relative: 16 %
NEUTROS ABS: 8.3 10*3/uL — AB (ref 1.7–7.7)
Neutrophils Relative %: 60 %
Platelets: 433 10*3/uL — ABNORMAL HIGH (ref 150–400)
RBC: 2.13 MIL/uL — ABNORMAL LOW (ref 3.87–5.11)
RDW: 22.8 % — ABNORMAL HIGH (ref 11.5–15.5)
WBC: 13.5 10*3/uL — ABNORMAL HIGH (ref 4.0–10.5)
nRBC: 15.5 % — ABNORMAL HIGH (ref 0.0–0.2)

## 2018-06-28 LAB — COMPREHENSIVE METABOLIC PANEL
ALK PHOS: 37 U/L — AB (ref 38–126)
ALT: 17 U/L (ref 0–44)
AST: 22 U/L (ref 15–41)
Albumin: 4 g/dL (ref 3.5–5.0)
Anion gap: 7 (ref 5–15)
BUN: 5 mg/dL — ABNORMAL LOW (ref 6–20)
CO2: 29 mmol/L (ref 22–32)
Calcium: 8.5 mg/dL — ABNORMAL LOW (ref 8.9–10.3)
Chloride: 103 mmol/L (ref 98–111)
Creatinine, Ser: <0.3 mg/dL — ABNORMAL LOW (ref 0.44–1.00)
Glucose, Bld: 157 mg/dL — ABNORMAL HIGH (ref 70–99)
Potassium: 3.5 mmol/L (ref 3.5–5.1)
SODIUM: 139 mmol/L (ref 135–145)
Total Bilirubin: 2.4 mg/dL — ABNORMAL HIGH (ref 0.3–1.2)
Total Protein: 6.5 g/dL (ref 6.5–8.1)

## 2018-06-28 LAB — PREPARE RBC (CROSSMATCH)

## 2018-06-28 MED ORDER — OXYCODONE HCL 5 MG PO TABS
10.0000 mg | ORAL_TABLET | ORAL | Status: DC | PRN
  Administered 2018-06-28: 10 mg via ORAL
  Filled 2018-06-28: qty 2

## 2018-06-28 MED ORDER — OXYCODONE HCL 5 MG PO TABS
10.0000 mg | ORAL_TABLET | ORAL | Status: DC
  Administered 2018-06-28 – 2018-06-29 (×4): 10 mg via ORAL
  Filled 2018-06-28 (×4): qty 2

## 2018-06-28 MED ORDER — SODIUM CHLORIDE 0.9% IV SOLUTION
Freq: Once | INTRAVENOUS | Status: AC
  Administered 2018-06-29: 03:00:00 via INTRAVENOUS

## 2018-06-28 MED ORDER — KETOROLAC TROMETHAMINE 15 MG/ML IJ SOLN
15.0000 mg | Freq: Four times a day (QID) | INTRAMUSCULAR | Status: DC
  Administered 2018-06-28 – 2018-06-29 (×4): 15 mg via INTRAVENOUS
  Filled 2018-06-28 (×4): qty 1

## 2018-06-28 NOTE — Progress Notes (Signed)
Subjective: Patient is Still having significant pain in the left forearm.  She has used 80 mg of the Dilaudid in the last 24 hours.  Patient is convinced that she will need transfusion and hemoglobin above 7 to get better.  This has always been her case when she has significant crisis.  No fever or chills however.  Her hemoglobin has improved to 6.7 today.  Objective: Vital signs in last 24 hours: Temp:  [98 F (36.7 C)-98.6 F (37 C)] 98.6 F (37 C) (02/09 1752) Pulse Rate:  [61-74] 67 (02/09 1752) Resp:  [11-18] 12 (02/09 1752) BP: (119-136)/(69-79) 131/69 (02/09 1752) SpO2:  [95 %-100 %] 95 % (02/09 1752) Weight:  [57.4 kg] 57.4 kg (02/09 0555) Weight change: -0.661 kg Last BM Date: 06/26/18  Intake/Output from previous day: 02/08 0701 - 02/09 0700 In: 240 [P.O.:240] Out: -  Intake/Output this shift: No intake/output data recorded.  General appearance: alert, cooperative, appears stated age and no distress Head: Normocephalic, without obvious abnormality, atraumatic Neck: no adenopathy, no carotid bruit, no JVD, supple, symmetrical, trachea midline and thyroid not enlarged, symmetric, no tenderness/mass/nodules Back: symmetric, no curvature. ROM normal. No CVA tenderness. Resp: clear to auscultation bilaterally Cardio: regular rate and rhythm, S1, S2 normal, no murmur, click, rub or gallop GI: soft, non-tender; bowel sounds normal; no masses,  no organomegaly Extremities: extremities normal, atraumatic, no cyanosis or edema Pulses: 2+ and symmetric Skin: Skin color, texture, turgor normal. No rashes or lesions Neurologic: Grossly normal  Lab Results: Recent Labs    06/27/18 0950 06/28/18 1220  WBC 15.2* 13.5*  HGB 6.2* 6.7*  HCT 19.1* 20.5*  PLT 452* 433*   BMET Recent Labs    06/27/18 0950 06/28/18 1220  NA 138 139  K 3.3* 3.5  CL 104 103  CO2 26 29  GLUCOSE 142* 157*  BUN 6 5*  CREATININE 0.35* <0.30*  CALCIUM 8.6* 8.5*    Studies/Results: No results  found.  Medications: I have reviewed the patient's current medications.  Assessment/Plan: A 48 year old female admitted with sickle cell painful crisis.  #1 sickle cell painful crisis: Patient will be maintained on Dilaudid PCA.  I will scheduled oxycodone every 4 hours overnight.  We will consider transfusing 1 unit of packed red blood cells to bring hemoglobin above 7 g.  #2 sickle cell anemia: Hemoglobin is at 6.7.Will transfuse 1 unit of packed red blood cells hopefully patient can be discharged in the morning.    #3 hypokalemia: Potassium is normalized.  #4 leukocytosis: Most likely due to vaso-occlusive crisis.  White count is slowly decreasing.  Continue to monitor   LOS: 2 days   GARBA,LAWAL 06/28/2018, 7:28 PM

## 2018-06-28 NOTE — Progress Notes (Signed)
CRITICAL VALUE ALERT  Critical Value:  Hgb 6.7  Date & Time Notied:  06/28/2018 @1325   Provider Notified: Dr. Jonelle Sidle  Orders Received/Actions taken: orders recieved

## 2018-06-29 DIAGNOSIS — D638 Anemia in other chronic diseases classified elsewhere: Secondary | ICD-10-CM

## 2018-06-29 DIAGNOSIS — G8929 Other chronic pain: Secondary | ICD-10-CM

## 2018-06-29 LAB — CBC
HCT: 25.7 % — ABNORMAL LOW (ref 36.0–46.0)
Hemoglobin: 8.7 g/dL — ABNORMAL LOW (ref 12.0–15.0)
MCH: 30.2 pg (ref 26.0–34.0)
MCHC: 33.9 g/dL (ref 30.0–36.0)
MCV: 89.2 fL (ref 80.0–100.0)
Platelets: 480 10*3/uL — ABNORMAL HIGH (ref 150–400)
RBC: 2.88 MIL/uL — ABNORMAL LOW (ref 3.87–5.11)
RDW: 21.2 % — ABNORMAL HIGH (ref 11.5–15.5)
WBC: 10.9 10*3/uL — ABNORMAL HIGH (ref 4.0–10.5)
nRBC: 11.4 % — ABNORMAL HIGH (ref 0.0–0.2)

## 2018-06-29 NOTE — Consult Note (Addendum)
[facility] [facility] Nurse wound consult note Consult requested for right leg wounds. Pt is familiar to the [facility] team from recent visit on 11/6. She states she is followed by the outpatient wound care center in [facility] and has serial debridements performed.  They have ordered a nonadherent dressing with a combination of Lidocaine gel and Triamcinolone cream to be applied daily. Wound type: Chronic full thickness stasis ulcers to inner and outer ankle Measurement: Inner ankle 11X4X.3cm, outer ankle 14X5X.3cm Both wounds are 10% red, 90% yellow, patchy areas of exposed tendons, painful to touch, small amt yellow drainage, no odor Periwound: Intact skin surrounding wounds Dressing procedure/placement/frequency: Discussed plan of care with patient. Continue present plan of care as ordered by the outpatient wound care center: Apply Mepitel contact layer Kellie Simmering # [phone]) over wound beds.  (The same sheet can be reused and changed Q  Mon and Thurs if it is not very soiled)  Remove all dressings and lightly cleanse wounds with saline-moistened gauze, then cover with Mepitel contact layer. On 2 separate 4X4s, apply a layer of Lidocaine gel, mixed together with a layer of Trimcinolone cream, then apply this over the Mepitel layer onto the wound.  Cover with an abd pad which has been cut in half for each side.  Apply kerlex, beginning just behind toes, to middle of the calf, then apply mesh stocking.   Please re-consult if further assistance is needed.  Thank-you,  Julien Girt MSN, [place], [place], [place], [place]

## 2018-06-29 NOTE — Progress Notes (Signed)
Pt has received discharge instructions with no immediate questions or concerns. Pt will be taken down in wheelchair to be discharged.

## 2018-06-29 NOTE — Discharge Instructions (Signed)
Sickle Cell Anemia, Adult °Sickle cell anemia is a condition where your red blood cells are shaped like sickles. Red blood cells carry oxygen through the body. Sickle-shaped cells do not live as long as normal red blood cells. They also clump together and block blood from flowing through the blood vessels. This prevents the body from getting enough oxygen. Sickle cell anemia causes organ damage and pain. It also increases the risk of infection. °Follow these instructions at home: °Medicines °· Take over-the-counter and prescription medicines only as told by your doctor. °· If you were prescribed an antibiotic medicine, take it as told by your doctor. Do not stop taking the antibiotic even if you start to feel better. °· If you develop a fever, do not take medicines to lower the fever right away. Tell your doctor about the fever. °Managing pain, stiffness, and swelling °· Try these methods to help with pain: °? Use a heating pad. °? Take a warm bath. °? Distract yourself, such as by watching TV. °Eating and drinking °· Drink enough fluid to keep your pee (urine) clear or pale yellow. Drink more in hot weather and during exercise. °· Limit or avoid alcohol. °· Eat a healthy diet. Eat plenty of fruits, vegetables, whole grains, and lean protein. °· Take vitamins and supplements as told by your doctor. °Traveling °· When traveling, keep these with you: °? Your medical information. °? The names of your doctors. °? Your medicines. °· If you need to take an airplane, talk to your doctor first. °Activity °· Rest often. °· Avoid exercises that make your heart beat much faster, such as jogging. °General instructions °· Do not use products that have nicotine or tobacco, such as cigarettes and e-cigarettes. If you need help quitting, ask your doctor. °· Consider wearing a medical alert bracelet. °· Avoid being in high places (high altitudes), such as mountains. °· Avoid very hot or cold temperatures. °· Avoid places where the  temperature changes a lot. °· Keep all follow-up visits as told by your doctor. This is important. °Contact a doctor if: °· A joint hurts. °· Your feet or hands hurt or swell. °· You feel tired (fatigued). °Get help right away if: °· You have symptoms of infection. These include: °? Fever. °? Chills. °? Being very tired. °? Irritability. °? Poor eating. °? Throwing up (vomiting). °· You feel dizzy or faint. °· You have new stomach pain, especially on the left side. °· You have a an erection (priapism) that lasts more than 4 hours. °· You have numbness in your arms or legs. °· You have a hard time moving your arms or legs. °· You have trouble talking. °· You have pain that does not go away when you take medicine. °· You are short of breath. °· You are breathing fast. °· You have a long-term cough. °· You have pain in your chest. °· You have a bad headache. °· You have a stiff neck. °· Your stomach looks bloated even though you did not eat much. °· Your skin is pale. °· You suddenly cannot see well. °Summary °· Sickle cell anemia is a condition where your red blood cells are shaped like sickles. °· Follow your doctor's advice on ways to manage pain, food to eat, activities to do, and steps to take for safe travel. °· Get medical help right away if you have any signs of infection, such as a fever. °This information is not intended to replace advice given to you by your   health care provider. Make sure you discuss any questions you have with your health care provider. °Document Released: 02/24/2013 Document Revised: 06/11/2016 Document Reviewed: 06/11/2016 °Elsevier Interactive Patient Education © 2019 [employer] ° °

## 2018-06-29 NOTE — Progress Notes (Signed)
Still waiting for blood to arrive from [place].  Will start blood unit when available.Roderick Pee

## 2018-06-30 DIAGNOSIS — G8929 Other chronic pain: Secondary | ICD-10-CM

## 2018-06-30 LAB — BPAM RBC
Blood Product Expiration Date: 202003032359
ISSUE DATE / TIME: 202002100303
Unit Type and Rh: 7300

## 2018-06-30 LAB — TYPE AND SCREEN
ABO/RH(D): B POS
Antibody Screen: NEGATIVE
Unit division: 0

## 2018-06-30 NOTE — Discharge Summary (Signed)
Physician Discharge Summary  [name] FOY:[id number] DOB: July 02, 1970 DOA: 06/26/2018  PCP: [name], FNP  Admit date: 06/26/2018  Discharge date: 06/30/2018  Discharge Diagnoses:  Principal Problem:   Sickle cell pain crisis ([place]) Active Problems:   Sickle cell crisis [facility])   Discharge Condition: Stable  Disposition:  Follow-up Information    [name], FNP Follow up.   Specialty:  Family Medicine Why:  Schedule first available appointment with primary care provider. I recommend that you follow-up with wound care specialist as scheduled. Contact information: [place] [address] [phone]          Pt is discharged home in good condition and is to follow up with [name], FNP in 1 week to have labs evaluated.   [name] is instructed to increase activity slowly and balance with rest for the next few days, and use prescribed medication to complete treatment of pain  Diet: Regular Wt Readings from Last 3 Encounters:  06/28/18 57.4 kg  04/06/18 59.1 kg  03/02/18 59.4 kg    History of present illness [name], a 48 year old female with a medical history significant for sickle cell anemia presented to the emergency department with complaints of left upper extremity pain for the past 3 to 4 days typical of her sickle cell pain crisis.  Patient was treated and evaluated and sickle cell day infusion center for the same problem without relief.  Patient has been taking oxycodone as prescribed without success.  Patient also has chronic ulceration of the lower extremity for which she follows with wound care.  ER course In the ER, patient was afebrile and maintaining oxygen saturation above 90% on RA.  There was no obvious swelling in lower extremities.  Patient has chronic wound on the lower extremity.  Despite multiple doses of pain medication, patient continued to have increased pain.  Patient admitted to [place] for further  management.  Hospital Course:  Sickle cell pain crisis: Patient admitted in sickle cell pain crisis.  She was managed appropriately with IV fluids, IV Dilaudid via PCA per weight-based protocol, and IV Toradol. Patient was also treated with other adjunct therapies per sickle cell pain management protocols. Patient also has a history of chronic pain syndrome.  Patient was transitioned to oxycodone 10 mg every 4 hours as needed for moderate to severe breakthrough pain.  She was advised to continue medication regimen on discharge.  Patient is aware of follow-up appointment with primary care for pain management.  Also, recommend that patient follows up in 1 week to repeat CBC.  Anemia of chronic disease: Hemoglobin decreased to 6.4, which is below baseline.  Patient's baseline is 8-9 g/dL.  Patient was transfused 1 unit of packed red blood cells.  Hemoglobin increased to 8.7 prior to discharge.  Patient will follow-up in 1 week to repeat CBC.  Chronic ulcers to lower extremities: Patient has chronic ulcers as a result of underlying sickle cell disease.  Patient has been followed in the wound care center with intermittent improvement in wounds.  Patient recently underwent a debridement on 06/05/2018 with Dr. Elvia Collum at [facility].  Patient is aware of all scheduled follow-up appointments. Leukocytosis: WBCs 20.9 on admission.  No signs of infection.  WBCs decreased appropriately as patient improved.  WBCs at discharge 10.9. Pain intensity decreased to 4/10 prior to discharge.  Patient will be discharge home in a hemodynamically stable condition.  Discharge Exam: Vitals:  06/29/18 1028 06/29/18 1135  BP: 138/79   Pulse: 68   Resp: 12 17  Temp: 98.4 F (36.9 C)   SpO2: 97% 95%   Vitals:   06/29/18 0613 06/29/18 0621 06/29/18 1028 06/29/18 1135  BP:  (!) 148/73 138/79   Pulse:  69 68   Resp: 18 15 12 17   Temp:  98.1 F (36.7 C) 98.4 F (36.9 C)   TempSrc:   Oral Oral   SpO2: 97% 93% 97% 95%  Weight:      Height:       Physical Exam Constitutional:      Appearance: Normal appearance.  HENT:     Head: Normocephalic.     Nose: Nose normal.     Mouth/Throat:     Mouth: Mucous membranes are moist.     Pharynx: Oropharynx is clear.  Cardiovascular:     Rate and Rhythm: Normal rate and regular rhythm.  Pulmonary:     Breath sounds: Normal breath sounds.  Abdominal:     General: Abdomen is flat.     Palpations: Abdomen is soft.  Neurological:     General: No focal deficit present.     Mental Status: She is alert. Mental status is at baseline.     Discharge Instructions  Discharge Instructions    Discharge patient   Complete by:  As directed    Discharge disposition:  01-Home or Self Care   Discharge patient date:  06/29/2018     Allergies as of 06/29/2018   No Known Allergies     Medication List    TAKE these medications   fluticasone 50 MCG/ACT nasal spray Commonly known as:  FLONASE Place 2 sprays into both nostrils daily.   folic acid 1 MG tablet Commonly known as:  FOLVITE Take 5 tablets (5 mg total) by mouth daily.   ibuprofen 600 MG tablet Commonly known as:  ADVIL,MOTRIN Take 1 tablet (600 mg total) by mouth every 6 (six) hours.   L-glutamine 5 g Pack Powder Packet Commonly known as:  ENDARI Take 5 g by mouth 2 (two) times daily.   Oxycodone HCl 10 MG Tabs Take 1 tablet (10 mg total) by mouth every 4 (four) hours as needed for up to 15 days (pain).   Vitamin D 50 MCG (2000 UT) Caps Take 2 capsules (4,000 Units total) by mouth daily.       The results of significant diagnostics from this hospitalization (including imaging, microbiology, ancillary and laboratory) are listed below for reference.    Significant Diagnostic Studies: No results found.  Microbiology: Recent Results (from the past 240 hour(s))  MRSA PCR Screening     Status: None   Collection Time: 06/27/18  2:01 AM  Result Value Ref  Range Status   MRSA by PCR NEGATIVE NEGATIVE Final    Comment:        The GeneXpert MRSA Assay (FDA approved for NASAL specimens only), is one component of a comprehensive MRSA colonization surveillance program. It is not intended to diagnose MRSA infection nor to guide or monitor treatment for MRSA infections. Performed at [facility], [place] [address], [place], [place] [address]      Labs: Basic Metabolic Panel: Recent Labs  Lab 06/25/18 1115 06/26/18 2036 06/27/18 0950 06/28/18 1220  NA 138 134* 138 139  K 3.3* 3.0* 3.3* 3.5  CL 105 100 104 103  CO2 24 24 26 29   GLUCOSE 105* 95 142* 157*  BUN 8 10 6  5*  CREATININE 0.36* 0.39* 0.35* <0.30*  CALCIUM 9.3 9.2 8.6* 8.5*   Liver Function Tests: Recent Labs  Lab 06/25/18 1115 06/26/18 2036 06/27/18 0950 06/28/18 1220  AST 22 24 22 22   ALT 14 15 15 17   ALKPHOS 40 42 35* 37*  BILITOT 6.1* 6.7* 2.9* 2.4*  PROT 7.7 7.9 6.7 6.5  ALBUMIN 4.8 4.8 4.2 4.0   No results for input(s): LIPASE, AMYLASE in the last 168 hours. No results for input(s): AMMONIA in the last 168 hours. CBC: Recent Labs  Lab 06/25/18 1115 06/26/18 2036 06/27/18 0950 06/28/18 1220 06/29/18 1146  WBC 16.2* 20.1* 15.2* 13.5* 10.9*  NEUTROABS 11.4* 14.3* 9.3* 8.3*  --   HGB 7.0* 6.4* 6.2* 6.7* 8.7*  HCT 21.2* 19.2* 19.1* 20.5* 25.7*  MCV 93.0 96.0 95.5 96.2 89.2  PLT 567* 494* 452* 433* 480*   Cardiac Enzymes: No results for input(s): CKTOTAL, CKMB, CKMBINDEX, TROPONINI in the last 168 hours. BNP: Invalid input(s): POCBNP CBG: No results for input(s): GLUCAP in the last 168 hours.  Time coordinating discharge: 50 minutes  Signed:  [name]  APRN, MSN, FNP-C [facility] [address] [place], [place] [address] [phone]   [facility] 06/30/2018, 2:17 PM

## 2018-07-15 ENCOUNTER — Other Ambulatory Visit: Payer: Self-pay | Admitting: Family Medicine

## 2018-07-15 DIAGNOSIS — D571 Sickle-cell disease without crisis: Secondary | ICD-10-CM

## 2018-07-24 ENCOUNTER — Telehealth: Payer: Self-pay

## 2018-07-24 DIAGNOSIS — D571 Sickle-cell disease without crisis: Secondary | ICD-10-CM

## 2018-07-24 DIAGNOSIS — G894 Chronic pain syndrome: Secondary | ICD-10-CM

## 2018-07-24 MED ORDER — OXYCODONE HCL 10 MG PO TABS: 10.0000 mg | ORAL_TABLET | ORAL | 0 refills | Status: DC | PRN

## 2018-07-24 NOTE — Telephone Encounter (Signed)
OK to RF? Not sure since patient has SCD. Thanks!

## 2018-07-24 NOTE — Telephone Encounter (Signed)
refilled 

## 2018-07-27 MED ORDER — IBUPROFEN 600 MG PO TABS: ORAL_TABLET | ORAL | 2 refills | Status: DC

## 2018-08-26 ENCOUNTER — Telehealth: Payer: Self-pay

## 2018-08-26 DIAGNOSIS — D571 Sickle-cell disease without crisis: Secondary | ICD-10-CM

## 2018-08-26 DIAGNOSIS — G894 Chronic pain syndrome: Secondary | ICD-10-CM

## 2018-08-27 MED ORDER — OXYCODONE HCL 10 MG PO TABS: 10.0000 mg | ORAL_TABLET | ORAL | 0 refills | Status: DC | PRN

## 2018-08-27 NOTE — Telephone Encounter (Signed)
Refilled

## 2018-09-02 ENCOUNTER — Other Ambulatory Visit: Payer: Self-pay | Admitting: Family Medicine

## 2018-09-02 ENCOUNTER — Telehealth: Payer: Self-pay

## 2018-09-02 MED ORDER — OXYCODONE HCL ER 10 MG PO T12A: 10.0000 mg | EXTENDED_RELEASE_TABLET | Freq: Two times a day (BID) | ORAL | 0 refills | Status: AC

## 2018-09-02 MED ORDER — MORPHINE SULFATE ER 15 MG PO TBCR: 15.0000 mg | EXTENDED_RELEASE_TABLET | Freq: Two times a day (BID) | ORAL | 0 refills | Status: AC

## 2018-09-02 NOTE — Telephone Encounter (Signed)
Patient states that the Oxycodone is not working and she is having lots of leg pain. Please advise

## 2018-09-02 NOTE — Telephone Encounter (Signed)
Patient notified that the Morphine Sulfate has been sent to pharmacy and authorization has been done.

## 2018-09-02 NOTE — Progress Notes (Signed)
Added MS ER 10 mg for long acting pain control

## 2018-09-03 ENCOUNTER — Telehealth (HOSPITAL_COMMUNITY): Payer: Self-pay | Admitting: *Deleted

## 2018-09-03 ENCOUNTER — Non-Acute Institutional Stay (HOSPITAL_COMMUNITY)
Admission: AD | Admit: 2018-09-03 | Discharge: 2018-09-03 | Disposition: A | Discharge status: Home / Self Care | Payer: Medicaid Other | Source: Ambulatory Visit | Attending: Internal Medicine | Admitting: Internal Medicine

## 2018-09-03 DIAGNOSIS — Z8249 Family history of ischemic heart disease and other diseases of the circulatory system: Secondary | ICD-10-CM | POA: Diagnosis not present

## 2018-09-03 DIAGNOSIS — G894 Chronic pain syndrome: Secondary | ICD-10-CM | POA: Diagnosis not present

## 2018-09-03 DIAGNOSIS — D5700 Hb-SS disease with crisis, unspecified: Secondary | ICD-10-CM | POA: Insufficient documentation

## 2018-09-03 DIAGNOSIS — Z7951 Long term (current) use of inhaled steroids: Secondary | ICD-10-CM | POA: Insufficient documentation

## 2018-09-03 DIAGNOSIS — Z79899 Other long term (current) drug therapy: Secondary | ICD-10-CM | POA: Insufficient documentation

## 2018-09-03 DIAGNOSIS — F1120 Opioid dependence, uncomplicated: Secondary | ICD-10-CM | POA: Diagnosis not present

## 2018-09-03 LAB — RETICULOCYTES
Immature Retic Fract: 46 % — ABNORMAL HIGH (ref 2.3–15.9)
RBC.: 2.4 MIL/uL — ABNORMAL LOW (ref 3.87–5.11)
Retic Count, Absolute: 649 10*3/uL — ABNORMAL HIGH (ref 19.0–186.0)
Retic Ct Pct: 27 % — ABNORMAL HIGH (ref 0.4–3.1)

## 2018-09-03 LAB — COMPREHENSIVE METABOLIC PANEL
ALT: 16 U/L (ref 0–44)
AST: 26 U/L (ref 15–41)
Albumin: 4.8 g/dL (ref 3.5–5.0)
Alkaline Phosphatase: 60 U/L (ref 38–126)
Anion gap: 13 (ref 5–15)
BUN: 7 mg/dL (ref 6–20)
CO2: 22 mmol/L (ref 22–32)
Calcium: 9.3 mg/dL (ref 8.9–10.3)
Chloride: 102 mmol/L (ref 98–111)
Creatinine, Ser: 0.44 mg/dL (ref 0.44–1.00)
GFR calc Af Amer: >60 mL/min (ref >60)
GFR calc non Af Amer: >60 mL/min (ref >60)
Glucose, Bld: 140 mg/dL — ABNORMAL HIGH (ref 70–99)
Potassium: 3 mmol/L — ABNORMAL LOW (ref 3.5–5.1)
Sodium: 137 mmol/L (ref 135–145)
Total Bilirubin: 7.6 mg/dL — ABNORMAL HIGH (ref 0.3–1.2)
Total Protein: 8.6 g/dL — ABNORMAL HIGH (ref 6.5–8.1)

## 2018-09-03 LAB — CBC WITH DIFFERENTIAL/PLATELET
Abs Immature Granulocytes: 0.31 10*3/uL — ABNORMAL HIGH (ref 0.00–0.07)
Basophils Absolute: 0.1 10*3/uL (ref 0.0–0.1)
Basophils Relative: 1 %
Eosinophils Absolute: 0.2 10*3/uL (ref 0.0–0.5)
Eosinophils Relative: 1 %
HCT: 22.7 % — ABNORMAL LOW (ref 36.0–46.0)
Hemoglobin: 7.8 g/dL — ABNORMAL LOW (ref 12.0–15.0)
Immature Granulocytes: 1 %
Lymphocytes Relative: 6 %
Lymphs Abs: 1.4 10*3/uL (ref 0.7–4.0)
MCH: 32.5 pg (ref 26.0–34.0)
MCHC: 34.4 g/dL (ref 30.0–36.0)
MCV: 94.6 fL (ref 80.0–100.0)
Monocytes Absolute: 2.9 10*3/uL — ABNORMAL HIGH (ref 0.1–1.0)
Monocytes Relative: 12 %
Neutro Abs: 19.7 10*3/uL — ABNORMAL HIGH (ref 1.7–7.7)
Neutrophils Relative %: 79 %
Platelets: 455 10*3/uL — ABNORMAL HIGH (ref 150–400)
RBC: 2.4 MIL/uL — ABNORMAL LOW (ref 3.87–5.11)
RDW: 24.1 % — ABNORMAL HIGH (ref 11.5–15.5)
WBC: 24.6 10*3/uL — ABNORMAL HIGH (ref 4.0–10.5)
nRBC: 18.8 % — ABNORMAL HIGH (ref 0.0–0.2)

## 2018-09-03 LAB — HCG, SERUM, QUALITATIVE: Preg, Serum: NEGATIVE

## 2018-09-03 MED ORDER — ONDANSETRON HCL 4 MG/2ML IJ SOLN: 4.0000 mg | Freq: Four times a day (QID) | INTRAMUSCULAR | Status: DC | PRN

## 2018-09-03 MED ORDER — HYDROMORPHONE 1 MG/ML IV SOLN
INTRAVENOUS | Status: DC
  Administered 2018-09-03: 30 mg via INTRAVENOUS
  Administered 2018-09-03: 11.5 mg via INTRAVENOUS
  Filled 2018-09-03: qty 30

## 2018-09-03 MED ORDER — KETOROLAC TROMETHAMINE 30 MG/ML IJ SOLN
15.0000 mg | Freq: Once | INTRAMUSCULAR | Status: AC
  Administered 2018-09-03: 14:00:00 via INTRAVENOUS
  Filled 2018-09-03: qty 1

## 2018-09-03 MED ORDER — SODIUM CHLORIDE 0.45 % IV SOLN
INTRAVENOUS | Status: DC
  Administered 2018-09-03: 11:00:00 via INTRAVENOUS

## 2018-09-03 MED ORDER — NALOXONE HCL 0.4 MG/ML IJ SOLN: 0.4000 mg | INTRAMUSCULAR | Status: DC | PRN

## 2018-09-03 MED ORDER — POTASSIUM CHLORIDE CRYS ER 20 MEQ PO TBCR
40.0000 meq | EXTENDED_RELEASE_TABLET | Freq: Once | ORAL | Status: AC
  Administered 2018-09-03: 15:00:00 40 meq via ORAL
  Filled 2018-09-03: qty 2

## 2018-09-03 MED ORDER — SODIUM CHLORIDE 0.9 % IV SOLN
25.0000 mg | INTRAVENOUS | Status: DC | PRN
  Filled 2018-09-03: qty 0.5

## 2018-09-03 MED ORDER — SODIUM CHLORIDE 0.9% FLUSH: 9.0000 mL | INTRAVENOUS | Status: DC | PRN

## 2018-09-03 MED ORDER — DIPHENHYDRAMINE HCL 25 MG PO CAPS: 25.0000 mg | ORAL_CAPSULE | ORAL | Status: DC | PRN

## 2018-09-03 MED ORDER — ACETAMINOPHEN 500 MG PO TABS
1000.0000 mg | ORAL_TABLET | Freq: Once | ORAL | Status: AC
  Administered 2018-09-03: 11:00:00 1000 mg via ORAL
  Filled 2018-09-03: qty 2

## 2018-09-03 NOTE — Progress Notes (Signed)
Patient admitted to the day infusion hospital for sickle cell pain crisis. Initially, patient reported left leg pain rated 10/10. For pain management, patient placed on Dilaudid PCA, given 15 mg Toradol, 1000 mg Tylenol and hydrated with IV fluids. Patient also given 40 mEq of potassium for low K+ level. At discharge, patient rated pain at 4/10. Vital signs stable. Discharge instructions given. Patient alert, oriented and ambulatory to wheelchair at discharge.

## 2018-09-03 NOTE — H&P (Signed)
[facility] History and Physical   Date: 09/03/2018  Patient name: [name] Medical record number: [id number] Date of birth: Sep 21, 1970 Age: 48 y.o. Gender: female PCP: [name], [facility]  Attending physician: Tresa Garter, MD  Chief Complaint: Left lower extremity pain  History of Present Illness: Rhegan [name], 48 year old female with a medical history significant for sickle cell disease, chronic pain syndrome, opiate dependence, bilateral lower extremity ulcers presents complaining of left leg pain that is consistent with typical sickle cell crisis.  Patient states that pain intensity increased and left leg several days ago and is been unrelieved by home medications.  She last had MS Contin and oxycodone this a.m. without sustained relief.  Patient states that pain intensity is 10/10 characterized as constant, throbbing, and occasionally sharp.  She says that pain is worsened with weightbearing and ambulation. Patient denies any sick contacts.  She denies fever, chills, shortness of breath, persistent cough, chest pain, abdominal pain, dysuria, nausea, vomiting, or diarrhea.  Meds: Medications Prior to Admission  Medication Sig Dispense Refill Last Dose  . Cholecalciferol (VITAMIN D) 2000 units CAPS Take 2 capsules (4,000 Units total) by mouth daily. 30 capsule 11 Past Week at Unknown time  . folic acid (FOLVITE) 1 MG tablet Take 5 tablets (5 mg total) by mouth daily. 150 tablet 11 09/02/2018 at Unknown time  . ibuprofen (ADVIL,MOTRIN) 600 MG tablet TAKE 1 TABLET(600 MG) BY MOUTH EVERY 6 HOURS 60 tablet 2 09/03/2018 at Unknown time  . morphine (MS CONTIN) 15 MG 12 hr tablet Take 1 tablet (15 mg total) by mouth every 12 (twelve) hours for 30 days. 60 tablet 0 09/03/2018 at Unknown time  . oxyCODONE (OXYCONTIN) 10 mg 12 hr tablet Take 1 tablet (10 mg total) by mouth every 12 (twelve) hours for 15 days. 30 tablet 0 09/03/2018 at Unknown time  . Oxycodone HCl 10 MG  TABS Take 1 tablet (10 mg total) by mouth every 4 (four) hours as needed for up to 15 days (pain). 90 tablet 0 09/03/2018 at Unknown time  . fluticasone (FLONASE) 50 MCG/ACT nasal spray Place 2 sprays into both nostrils daily. (Patient not taking: Reported on 02/02/2018) 16 g 6 Not Taking at Unknown time  . L-glutamine (ENDARI) 5 g PACK Powder Packet Take 5 g by mouth 2 (two) times daily. 180 each 1 06/26/2018 at Unknown time    Allergies: Patient has no known allergies. Past Medical History:  Diagnosis Date  . Anemia 03/30/2012   Hx of sickle cell disease  . CAP (community acquired pneumonia)    2014  . Cholelithiasis 03/30/2012  . Chronic wound of extremity 04/01/2012  . Elevated LFTs   . Leg ulcer ([place]) 10/27/2012   Chronic under care of wound clinic  . Multiple open wounds of lower extremity    chronic wounds B/LLE  . Sickle cell disease ([place])   . Sinus bradycardia by electrocardiogram 04/03/2012   Past Surgical History:  Procedure Laterality Date  . ALLOGRAFT APPLICATION Right 07/11/9796   Procedure: SURGICAL PREP FOR GRAFTING RIGHT LOWER EXTREMITY AND APPLICATION OF Jannifer Hick;  Surgeon: Irene Limbo, MD;  Location: [facility];  Service: Plastics;  Laterality: Right;  . APPLICATION OF A-CELL OF EXTREMITY Right 10/09/2015   Procedure: APPLICATION OF Jannifer Hick;  Surgeon: Irene Limbo, MD;  Location: [place];  Service: Plastics;  Laterality: Right;  . BREAST SURGERY     left breast cyst aspiration  . CESAREAN SECTION    .  CESAREAN SECTION N/A 01/06/2014   Procedure: CESAREAN SECTION;  Surgeon: Emily Filbert, MD;  Location: [facility];  Service: Obstetrics;  Laterality: N/A;  . CESAREAN SECTION N/A 05/04/2017   Procedure: CESAREAN SECTION;  Surgeon: Woodroe Mode, MD;  Location: [facility];  Service: Obstetrics;  Laterality: N/A;  . I&D EXTREMITY Right 10/09/2015   Procedure: SURGICAL PREPARATION FOR GRAFTING RIGHT ANKLE AND APPLICATION  THERASKIN;  Surgeon: Irene Limbo, MD;  Location: [place];  Service: Plastics;  Laterality: Right;  . SKIN FULL THICKNESS GRAFT Bilateral 06/17/2016   Procedure: SURGICAL PREP FOR GRAFTING, BILATERAL LOWER EXTREMITIES AND APPLICATION OF Jannifer Hick;  Surgeon: Irene Limbo, MD;  Location: [facility];  Service: Plastics;  Laterality: Bilateral;  . TONSILLECTOMY     Family History  Problem Relation Age of Onset  . Sickle cell anemia Sister   . Diabetes Mother   . Asthma Mother   . Sickle cell trait Mother   . Sickle cell trait Father   . Hypertension Father    Social History   Socioeconomic History  . Marital status: Married    Spouse name: [name]  . Number of children: 1  . Years of education: Not on file  . Highest education level: Not on file  Occupational History  . Occupation: Employed in home care.  Social Needs  . Financial resource strain: Not on file  . Food insecurity:    Worry: Not on file    Inability: Not on file  . Transportation needs:    Medical: Not on file    Non-medical: Not on file  Tobacco Use  . Smoking status: Never Smoker  . Smokeless tobacco: Never Used  Substance and Sexual Activity  . Alcohol use: No  . Drug use: No  . Sexual activity: Yes    Birth control/protection: None  Lifestyle  . Physical activity:    Days per week: Not on file    Minutes per session: Not on file  . Stress: Not on file  Relationships  . Social connections:    Talks on phone: Not on file    Gets together: Not on file    Attends religious service: Not on file    Active member of club or organization: Not on file    Attends meetings of clubs or organizations: Not on file    Relationship status: Not on file  . Intimate partner violence:    Fear of current or ex partner: Not on file    Emotionally abused: Not on file    Physically abused: Not on file    Forced sexual activity: Not on file  Other Topics Concern  . Not on file  Social  History Narrative   Lives with husband.  Review of Systems  Constitutional: Negative for chills and fever.  Eyes: Negative.   Respiratory: Negative for sputum production and shortness of breath.   Cardiovascular: Negative.   Gastrointestinal: Negative.  Negative for nausea and vomiting.  Genitourinary: Negative.   Musculoskeletal: Positive for myalgias.  Skin: Negative.         lower extremity ulcers  Neurological: Negative for dizziness and headaches.  Endo/Heme/Allergies: Negative.   Psychiatric/Behavioral: Negative for depression and suicidal ideas.    Physical Exam Constitutional:      General: She is in acute distress.  Eyes:     General: Scleral icterus present.     Pupils: Pupils are equal, round, and reactive to light.  Cardiovascular:     Rate  and Rhythm: Normal rate and regular rhythm.     Pulses: Normal pulses.  Pulmonary:     Effort: Pulmonary effort is normal.     Breath sounds: Normal breath sounds.  Abdominal:     General: Bowel sounds are normal.     Palpations: Abdomen is soft.  Skin:    Comments: Bilateral lower extremity ulcers, covered with bandages.  Neurological:     General: No focal deficit present.     Mental Status: She is alert and oriented to person, place, and time.  Psychiatric:        Mood and Affect: Mood normal.        Behavior: Behavior normal.        Thought Content: Thought content normal.        Judgment: Judgment normal.     Physical Exam: Blood pressure (!) 129/53, pulse 77, temperature 98.6 F (37 C), temperature source Oral, resp. rate 11, last menstrual period 08/17/2018, SpO2 98 %, unknown if currently breastfeeding.  Lab results: Results for orders placed or performed during the hospital encounter of 09/03/18 (from the past 24 hour(s))  hCG, serum, qualitative     Status: None   Collection Time: 09/03/18 11:13 AM  Result Value Ref Range   Preg, Serum NEGATIVE NEGATIVE  Comprehensive metabolic panel     Status: Abnormal    Collection Time: 09/03/18 11:14 AM  Result Value Ref Range   Sodium 137 135 - 145 mmol/L   Potassium 3.0 (L) 3.5 - 5.1 mmol/L   Chloride 102 98 - 111 mmol/L   CO2 22 22 - 32 mmol/L   Glucose, Bld 140 (H) 70 - 99 mg/dL   BUN 7 6 - 20 mg/dL   Creatinine, Ser 0.44 0.44 - 1.00 mg/dL   Calcium 9.3 8.9 - 10.3 mg/dL   Total Protein 8.6 (H) 6.5 - 8.1 g/dL   Albumin 4.8 3.5 - 5.0 g/dL   AST 26 15 - 41 U/L   ALT 16 0 - 44 U/L   Alkaline Phosphatase 60 38 - 126 U/L   Total Bilirubin 7.6 (H) 0.3 - 1.2 mg/dL   GFR calc non Af Amer >60 >60 mL/min   GFR calc Af Amer >60 >60 mL/min   Anion gap 13 5 - 15  CBC WITH DIFFERENTIAL     Status: Abnormal   Collection Time: 09/03/18 11:14 AM  Result Value Ref Range   WBC 24.6 (H) 4.0 - 10.5 K/uL   RBC 2.40 (L) 3.87 - 5.11 MIL/uL   Hemoglobin 7.8 (L) 12.0 - 15.0 g/dL   HCT 22.7 (L) 36.0 - 46.0 %   MCV 94.6 80.0 - 100.0 fL   MCH 32.5 26.0 - 34.0 pg   MCHC 34.4 30.0 - 36.0 g/dL   RDW 24.1 (H) 11.5 - 15.5 %   Platelets 455 (H) 150 - 400 K/uL   nRBC 18.8 (H) 0.0 - 0.2 %   Neutrophils Relative % 79 %   Neutro Abs 19.7 (H) 1.7 - 7.7 K/uL   Lymphocytes Relative 6 %   Lymphs Abs 1.4 0.7 - 4.0 K/uL   Monocytes Relative 12 %   Monocytes Absolute 2.9 (H) 0.1 - 1.0 K/uL   Eosinophils Relative 1 %   Eosinophils Absolute 0.2 0.0 - 0.5 K/uL   Basophils Relative 1 %   Basophils Absolute 0.1 0.0 - 0.1 K/uL   RBC Morphology Marked sickle cells    Immature Granulocytes 1 %   Abs Immature Granulocytes 0.31 (H)  0.00 - 0.07 K/uL  Reticulocytes     Status: Abnormal   Collection Time: 09/03/18 11:14 AM  Result Value Ref Range   Retic Ct Pct 27.0 (H) 0.4 - 3.1 %   RBC. 2.40 (L) 3.87 - 5.11 MIL/uL   Retic Count, Absolute 649.0 (H) 19.0 - 186.0 K/uL   Immature Retic Fract 46.0 (H) 2.3 - 15.9 %    Imaging results:  No results found.   Assessment & Plan:  Patient will be admitted to the day infusion center for extended observation  Initiate 0.45% saline at  100 ml/hour for cellular rehydration  Toradol 15 mg IV times one for inflammation.  Tylenol 1000 mg times one  Start Dilaudid PCA High Concentration per weight based protocol.    Patient will be re-evaluated for pain intensity in the context of function and relationship to baseline as care progresses.  If no significant pain relief, will transfer patient to inpatient services for a higher level of care.    Review CBC with differential, CMP, serum pregnancy, and reticulocytes as results become available   [name]  APRN, MSN, FNP-C [facility] [address] [place], [place] [address] [phone]  09/03/2018, 5:00 PM

## 2018-09-03 NOTE — Social Work (Signed)
CSW met with patient at bedside in the day hospital. Introduced self and role at [facility]. Advised that CSW will be available for support and resources as needed.  

## 2018-09-03 NOTE — Discharge Summary (Signed)
[facility] Discharge Summary   Patient ID: [name] MRN: [id number] DOB/AGE: September 01, 1970 48 y.o.  Admit date: 09/03/2018 Discharge date: 09/03/2018  Primary Care Physician:  [name], [place]  Admission Diagnoses:  Active Problems:   Sickle cell anemia with crisis [facility]   Discharge Medications:  Allergies as of 09/03/2018   No Known Allergies     Medication List    TAKE these medications   fluticasone 50 MCG/ACT nasal spray Commonly known as:  FLONASE Place 2 sprays into both nostrils daily.   folic acid 1 MG tablet Commonly known as:  FOLVITE Take 5 tablets (5 mg total) by mouth daily.   ibuprofen 600 MG tablet Commonly known as:  ADVIL TAKE 1 TABLET(600 MG) BY MOUTH EVERY 6 HOURS   L-glutamine 5 g Pack Powder Packet Commonly known as:  Endari Take 5 g by mouth 2 (two) times daily.   morphine 15 MG 12 hr tablet Commonly known as:  MS CONTIN Take 1 tablet (15 mg total) by mouth every 12 (twelve) hours for 30 days.   Oxycodone HCl 10 MG Tabs Take 1 tablet (10 mg total) by mouth every 4 (four) hours as needed for up to 15 days (pain).   oxyCODONE 10 mg 12 hr tablet Commonly known as:  OxyCONTIN Take 1 tablet (10 mg total) by mouth every 12 (twelve) hours for 15 days.   Vitamin D 50 MCG (2000 UT) Caps Take 2 capsules (4,000 Units total) by mouth daily.        Consults:  None  Significant Diagnostic Studies:  No results found.   [facility] Course: [name], a 48 year old female with a medical history significant for sickle cell disease, chronic pain syndrome, opiate dependence, and chronic lower extremity ulcers was admitted to sickle cell day infusion center with acute pain crisis for pain management and extended observation.  All laboratory values reviewed Hemoglobin 7.8, which is decreased from patient's baseline.  No indication for blood transfusion during admission.   Potassium 3.0, repleted with K. Dur  40 mEq x 1.  Patient advised to follow-up with PCP in 1 week for labs.  Patient afebrile and maintaining oxygen saturation at 98% on RA.  0.45% saline at 100 mL/h Toradol 15 mg IV x1 Tylenol 1000 mg by mouth x1 IV Dilaudid via PCA with settings of 0.5 mg, 10-minute lockout, and 3 mg/h.  Pain intensity decreased to 5/10.  Patient states that she can manage at home on current medication regimen. Patient alert, oriented, and ambulating without assistance. She will follow-up with PCP as discussed   Discharge instructions:  Resume all home medications.  Follow up with PCP as previously  scheduled.   Discussed the importance of drinking 64 ounces of water daily to  help prevent pain crises, it is important to drink plenty of water throughout the day. This is because dehydration of red blood cells may lead further sickling.   Avoid all stressors that precipitate sickle cell pain crisis.     The patient was given clear instructions to go to ER or return to medical center if symptoms do not improve, worsen or new problems develop.     Physical Exam at Discharge:  BP (!) 129/53 (BP Location: Right Arm)   Pulse 77   Temp 98.6 F (37 C) (Oral)   Resp 11   LMP 08/17/2018   SpO2 98%  Physical Exam Constitutional:      Appearance: She is not ill-appearing.  Eyes:  General: Scleral icterus present.     Pupils: Pupils are equal, round, and reactive to light.  Cardiovascular:     Rate and Rhythm: Normal rate and regular rhythm.     Pulses: Normal pulses.     Heart sounds: Normal heart sounds.  Pulmonary:     Effort: Pulmonary effort is normal.     Breath sounds: Normal breath sounds.  Abdominal:     General: Abdomen is flat. Bowel sounds are normal.  Neurological:     General: No focal deficit present.     Mental Status: She is alert. Mental status is at baseline.  Psychiatric:        Mood and Affect: Mood normal.        Behavior: Behavior normal.        Thought Content:  Thought content normal.        Judgment: Judgment normal.      Disposition at Discharge: Discharge disposition: 01-Home or Self Care       Discharge Orders: Discharge Instructions    Discharge patient   Complete by:  As directed    Discharge disposition:  01-Home or Self Care   Discharge patient date:  09/03/2018      Condition at Discharge:   Stable  Time spent on Discharge:  Greater than 30 minutes.  Signed: [name]  APRN, MSN, FNP-C [facility] [address] [place], [place] [address] [phone]  09/03/2018, 4:54 PM

## 2018-09-03 NOTE — Discharge Instructions (Signed)
Coronavirus (COVID-19) Are you at risk?  Are you at risk for the Coronavirus (COVID-19)?  To be considered high risk for Coronavirus (COVID-19), you have to meet the following criteria:  Traveled to [place], [place], [place], [place] or [place]; or in the [place] to [place], [place], [place], or [place]; and have fever, cough, and shortness of breath within the last 2 weeks of travel or  Been in close contact with a person diagnosed with COVID-19 within the last 2 weeks and have fever, cough, and shortness of breath  If you do not meet these criteria, you are considered low risk for COVID-19.  What to do if you are high risk for COVID-19?   If you are having a medical emergency, call 911.  Seek medical care right away. Before you go to a doctors office, urgent care or emergency department, call ahead and tell them about your recent travel, contact with someone diagnosed with COVID-19, and your symptoms. You should receive instructions from your physicians office regarding next steps of care.   When you arrive at healthcare provider, tell the healthcare staff immediately you have returned from visiting [place], [place], [place], [place] or [place]; or traveled in the [place] to [place], [place], [place], or [place]; in the last two weeks or you have been in close contact with a person diagnosed with COVID-19 in the last 2 weeks.    Tell the health care staff about your symptoms: fever, cough and shortness of breath.  After you have been seen by a medical provider, you will be either: o Tested for (COVID-19) and discharged home on quarantine except to seek medical care if symptoms worsen, and asked to  - Stay home and avoid contact with others until you get your results (4-5 days)  - Avoid travel on public transportation if possible (such as bus, train, or airplane) or o Sent to the Emergency Department by EMS for evaluation, COVID-19 testing, and possible admission  depending on your condition and test results.   What to do if you are LOW RISK for COVID-19?  Reduce your risk of any infection by using the same precautions used for avoiding the common cold or flu:   Wash your hands often with soap and warm water for at least 20 seconds.  If soap and water are not readily available, use an alcohol-based hand sanitizer with at least 60% alcohol.   If coughing or sneezing, cover your mouth and nose by coughing or sneezing into the elbow areas of your shirt or coat, into a tissue or into your sleeve (not your hands).  Avoid shaking hands with others and consider head nods or verbal greetings only.  Avoid touching your eyes, nose, or mouth with unwashed hands.   Avoid close contact with people who are sick.  Avoid places or events with large numbers of people in one location, like concerts or sporting events.  Carefully consider travel plans you have or are making.  If you are planning any travel outside or inside the [place], visit the [facility] webpage for the latest health notices.  If you have some symptoms but not all symptoms, continue to monitor at home and seek medical attention if your symptoms worsen.  If you are having a medical emergency, call 911.   Additional healthcare options for patients  [place] / e-Visit:  [email or web address]           [facility]: [phone]  [facility]: [phone]                   [facility]: [phone]   Sickle Cell Anemia, Adult Sickle cell anemia is a condition where your red blood cells are shaped like sickles. Red blood cells carry oxygen through the body. Sickle-shaped cells do not live as long as normal red blood cells. They also clump together and block blood from flowing through the blood vessels. This prevents the body from getting enough oxygen. Sickle cell anemia causes organ damage  and pain. It also increases the risk of infection. Follow these instructions at home: Medicines  Take over-the-counter and prescription medicines only as told by your doctor.  If you were prescribed an antibiotic medicine, take it as told by your doctor. Do not stop taking the antibiotic even if you start to feel better.  If you develop a fever, do not take medicines to lower the fever right away. Tell your doctor about the fever. Managing pain, stiffness, and swelling  Try these methods to help with pain: ? Use a heating pad. ? Take a warm bath. ? Distract yourself, such as by watching TV. Eating and drinking  Drink enough fluid to keep your pee (urine) clear or pale yellow. Drink more in hot weather and during exercise.  Limit or avoid alcohol.  Eat a healthy diet. Eat plenty of fruits, vegetables, whole grains, and lean protein.  Take vitamins and supplements as told by your doctor. Traveling  When traveling, keep these with you: ? Your medical information. ? The names of your doctors. ? Your medicines.  If you need to take an airplane, talk to your doctor first. Activity  Rest often.  Avoid exercises that make your heart beat much faster, such as jogging. General instructions  Do not use products that have nicotine or tobacco, such as cigarettes and e-cigarettes. If you need help quitting, ask your doctor.  Consider wearing a medical alert bracelet.  Avoid being in high places (high altitudes), such as mountains.  Avoid very hot or cold temperatures.  Avoid places where the temperature changes a lot.  Keep all follow-up visits as told by your doctor. This is important. Contact a doctor if:  A joint hurts.  Your feet or hands hurt or swell.  You feel tired (fatigued). Get help right away if:  You have symptoms of infection. These include: ? Fever. ? Chills. ? Being very tired. ? Irritability. ? Poor eating. ? Throwing up (vomiting).  You feel  dizzy or faint.  You have new stomach pain, especially on the left side.  You have a an erection (priapism) that lasts more than 4 hours.  You have numbness in your arms or legs.  You have a hard time moving your arms or legs.  You have trouble talking.  You have pain that does not go away when you take medicine.  You are short of breath.  You are breathing fast.  You have a long-term cough.  You have pain in your chest.  You have a bad headache.  You have a stiff neck.  Your stomach looks bloated even though you did not eat much.  Your skin is pale.  You suddenly cannot see well. Summary  Sickle cell anemia is a condition where your red blood cells are shaped like sickles.  Follow your doctor's advice on ways to manage pain, food to eat, activities to do, and steps to take for safe  travel.  Get medical help right away if you have any signs of infection, such as a fever. This information is not intended to replace advice given to you by your health care provider. Make sure you discuss any questions you have with your health care provider. Document Released: 02/24/2013 Document Revised: 06/11/2016 Document Reviewed: 06/11/2016 Elsevier Interactive Patient Education  2019 [employer].

## 2018-09-03 NOTE — Telephone Encounter (Signed)
Patient called requesting to come to the day hospital for sickle cell pain. Patient complains of left leg pain rated 10/10. Reports last taking Oxycontin for pain at 5:00 am. COVID-19 screening done and patient denies all symptoms. Denies fever, chest pain, nausea, vomiting, diarrhea and abdominal pain. Admits to having means of transportation at discharge without driving self. [place], [place] notified. Patient can come to the day hospital for pain management. Patient advised and expresses an understanding.

## 2018-09-15 NOTE — Telephone Encounter (Signed)
Message sent to provider 

## 2018-09-16 NOTE — Telephone Encounter (Signed)
Message sent to provider 

## 2018-09-18 NOTE — Telephone Encounter (Signed)
Message sent to provider 

## 2018-09-22 ENCOUNTER — Other Ambulatory Visit: Payer: Self-pay | Admitting: Internal Medicine

## 2018-09-22 ENCOUNTER — Telehealth: Payer: Self-pay

## 2018-09-22 DIAGNOSIS — D571 Sickle-cell disease without crisis: Secondary | ICD-10-CM

## 2018-09-22 DIAGNOSIS — G894 Chronic pain syndrome: Secondary | ICD-10-CM

## 2018-09-22 MED ORDER — OXYCODONE HCL 10 MG PO TABS: 10.0000 mg | ORAL_TABLET | ORAL | 0 refills | Status: DC | PRN

## 2018-09-22 NOTE — Telephone Encounter (Signed)
Refilled

## 2018-09-24 NOTE — Telephone Encounter (Signed)
Message sent to provider 

## 2018-10-02 ENCOUNTER — Telehealth (HOSPITAL_COMMUNITY): Payer: Self-pay

## 2018-10-02 ENCOUNTER — Encounter (HOSPITAL_COMMUNITY): Payer: Self-pay | Admitting: General Practice

## 2018-10-02 ENCOUNTER — Non-Acute Institutional Stay (HOSPITAL_COMMUNITY)
Admission: AD | Admit: 2018-10-02 | Discharge: 2018-10-02 | Disposition: A | Discharge status: Home / Self Care | Payer: Medicaid Other | Source: Ambulatory Visit | Attending: Internal Medicine | Admitting: Internal Medicine

## 2018-10-02 DIAGNOSIS — D638 Anemia in other chronic diseases classified elsewhere: Secondary | ICD-10-CM | POA: Insufficient documentation

## 2018-10-02 DIAGNOSIS — Z832 Family history of diseases of the blood and blood-forming organs and certain disorders involving the immune mechanism: Secondary | ICD-10-CM | POA: Diagnosis not present

## 2018-10-02 DIAGNOSIS — M25561 Pain in right knee: Secondary | ICD-10-CM | POA: Diagnosis not present

## 2018-10-02 DIAGNOSIS — F1120 Opioid dependence, uncomplicated: Secondary | ICD-10-CM | POA: Diagnosis not present

## 2018-10-02 DIAGNOSIS — L97919 Non-pressure chronic ulcer of unspecified part of right lower leg with unspecified severity: Secondary | ICD-10-CM | POA: Diagnosis not present

## 2018-10-02 DIAGNOSIS — D5700 Hb-SS disease with crisis, unspecified: Secondary | ICD-10-CM | POA: Diagnosis present

## 2018-10-02 DIAGNOSIS — G894 Chronic pain syndrome: Secondary | ICD-10-CM | POA: Diagnosis not present

## 2018-10-02 DIAGNOSIS — L97929 Non-pressure chronic ulcer of unspecified part of left lower leg with unspecified severity: Secondary | ICD-10-CM | POA: Diagnosis not present

## 2018-10-02 DIAGNOSIS — Z791 Long term (current) use of non-steroidal anti-inflammatories (NSAID): Secondary | ICD-10-CM | POA: Diagnosis not present

## 2018-10-02 DIAGNOSIS — Z79899 Other long term (current) drug therapy: Secondary | ICD-10-CM | POA: Insufficient documentation

## 2018-10-02 LAB — CBC WITH DIFFERENTIAL/PLATELET
Abs Immature Granulocytes: 0.35 10*3/uL — ABNORMAL HIGH (ref 0.00–0.07)
Basophils Absolute: 0.1 10*3/uL (ref 0.0–0.1)
Basophils Relative: 0 %
Eosinophils Absolute: 0.2 10*3/uL (ref 0.0–0.5)
Eosinophils Relative: 1 %
HCT: 19.7 % — ABNORMAL LOW (ref 36.0–46.0)
Hemoglobin: 6.7 g/dL — CL (ref 12.0–15.0)
Immature Granulocytes: 2 %
Lymphocytes Relative: 6 %
Lymphs Abs: 1.4 10*3/uL (ref 0.7–4.0)
MCH: 29.6 pg (ref 26.0–34.0)
MCHC: 34 g/dL (ref 30.0–36.0)
MCV: 87.2 fL (ref 80.0–100.0)
Monocytes Absolute: 3 10*3/uL — ABNORMAL HIGH (ref 0.1–1.0)
Monocytes Relative: 14 %
Neutro Abs: 16.3 10*3/uL — ABNORMAL HIGH (ref 1.7–7.7)
Neutrophils Relative %: 77 %
Platelets: 437 10*3/uL — ABNORMAL HIGH (ref 150–400)
RBC: 2.26 MIL/uL — ABNORMAL LOW (ref 3.87–5.11)
RDW: 26.1 % — ABNORMAL HIGH (ref 11.5–15.5)
WBC: 21.3 10*3/uL — ABNORMAL HIGH (ref 4.0–10.5)
nRBC: 10.6 % — ABNORMAL HIGH (ref 0.0–0.2)

## 2018-10-02 LAB — COMPREHENSIVE METABOLIC PANEL
ALT: 14 U/L (ref 0–44)
AST: 22 U/L (ref 15–41)
Albumin: 4.2 g/dL (ref 3.5–5.0)
Alkaline Phosphatase: 64 U/L (ref 38–126)
Anion gap: 10 (ref 5–15)
BUN: 7 mg/dL (ref 6–20)
CO2: 24 mmol/L (ref 22–32)
Calcium: 8.4 mg/dL — ABNORMAL LOW (ref 8.9–10.3)
Chloride: 101 mmol/L (ref 98–111)
Creatinine, Ser: 0.37 mg/dL — ABNORMAL LOW (ref 0.44–1.00)
GFR calc Af Amer: >60 mL/min (ref >60)
GFR calc non Af Amer: >60 mL/min (ref >60)
Glucose, Bld: 92 mg/dL (ref 70–99)
Potassium: 3 mmol/L — ABNORMAL LOW (ref 3.5–5.1)
Sodium: 135 mmol/L (ref 135–145)
Total Bilirubin: 5.2 mg/dL — ABNORMAL HIGH (ref 0.3–1.2)
Total Protein: 7.6 g/dL (ref 6.5–8.1)

## 2018-10-02 LAB — TYPE AND SCREEN
ABO/RH(D): B POS
Antibody Screen: NEGATIVE

## 2018-10-02 LAB — PREGNANCY, URINE: Preg Test, Ur: NEGATIVE

## 2018-10-02 LAB — RETICULOCYTES
Immature Retic Fract: 30.4 % — ABNORMAL HIGH (ref 2.3–15.9)
RBC.: 2.26 MIL/uL — ABNORMAL LOW (ref 3.87–5.11)
Retic Count, Absolute: 280.5 10*3/uL — ABNORMAL HIGH (ref 19.0–186.0)
Retic Ct Pct: 12.2 % — ABNORMAL HIGH (ref 0.4–3.1)

## 2018-10-02 MED ORDER — HYDROMORPHONE 1 MG/ML IV SOLN
INTRAVENOUS | Status: DC
  Administered 2018-10-02: 10 mg via INTRAVENOUS
  Administered 2018-10-02: 30 mg via INTRAVENOUS
  Filled 2018-10-02: qty 30

## 2018-10-02 MED ORDER — SODIUM CHLORIDE 0.45 % IV SOLN
INTRAVENOUS | Status: DC
  Administered 2018-10-02: 11:00:00 via INTRAVENOUS

## 2018-10-02 MED ORDER — ONDANSETRON HCL 4 MG/2ML IJ SOLN
4.0000 mg | Freq: Four times a day (QID) | INTRAMUSCULAR | Status: DC | PRN
  Administered 2018-10-02: 4 mg via INTRAVENOUS
  Filled 2018-10-02: qty 2

## 2018-10-02 MED ORDER — SODIUM CHLORIDE 0.9% FLUSH: 9.0000 mL | INTRAVENOUS | Status: DC | PRN

## 2018-10-02 MED ORDER — NALOXONE HCL 0.4 MG/ML IJ SOLN: 0.4000 mg | INTRAMUSCULAR | Status: DC | PRN

## 2018-10-02 MED ORDER — KETOROLAC TROMETHAMINE 30 MG/ML IJ SOLN
15.0000 mg | Freq: Once | INTRAMUSCULAR | Status: AC
  Administered 2018-10-02: 15 mg via INTRAVENOUS
  Filled 2018-10-02: qty 1

## 2018-10-02 MED ORDER — DIPHENHYDRAMINE HCL 25 MG PO CAPS
25.0000 mg | ORAL_CAPSULE | ORAL | Status: DC | PRN
  Administered 2018-10-02: 25 mg via ORAL
  Filled 2018-10-02: qty 1

## 2018-10-02 MED ORDER — ACETAMINOPHEN 500 MG PO TABS
1000.0000 mg | ORAL_TABLET | Freq: Once | ORAL | Status: AC
  Administered 2018-10-02: 1000 mg via ORAL
  Filled 2018-10-02: qty 2

## 2018-10-02 NOTE — H&P (Signed)
[facility] History and Physical   Date: 10/02/2018  Patient name: [name] Medical record number: [id number] Date of birth: [birth year] Age: 48 y.o. Gender: female PCP: [name], [place]  Attending physician: Tresa Garter, MD  Chief Complaint: Generalized pain  History of Present Illness: [name], a 48 year old female with a medical history significant for sickle cell disease, type SS, chronic pain syndrome, opiate dependence, history of anemia of chronic disease, and history of chronic bilateral leg ulcers presents complaining of right knee pain that is consistent with typical sickle cell pain crisis.  Patient states that pain intensity increased several days ago.  She has not identified any palliative or provocative factors concerning current crisis.  Patient last had oxycodone this a.m. without sustained relief.  Patient says that she was started on MS Contin during last appointment with PCP, which is been ineffective.  Current pain intensity 10/10 characterized as constant and throbbing.  Patient states the pain is worsened by ambulation.  Patient denies fever, chills, persistent cough, chest pain, shortness of breath, recent travel, or exposure to COVID-19. Patient denies headache, dysuria, nausea, vomiting, or diarrhea.  Patient admitted to sickle cell day infusion center for pain management and extended observation.  Meds: Medications Prior to Admission  Medication Sig Dispense Refill Last Dose  . Cholecalciferol (VITAMIN D) 2000 units CAPS Take 2 capsules (4,000 Units total) by mouth daily. 30 capsule 11 Past Week at Unknown time  . fluticasone (FLONASE) 50 MCG/ACT nasal spray Place 2 sprays into both nostrils daily. (Patient not taking: Reported on 02/02/2018) 16 g 6 Not Taking at Unknown time  . folic acid (FOLVITE) 1 MG tablet Take 5 tablets (5 mg total) by mouth daily. 150 tablet 11 09/02/2018 at Unknown time  . ibuprofen (ADVIL,MOTRIN) 600 MG  tablet TAKE 1 TABLET(600 MG) BY MOUTH EVERY 6 HOURS 60 tablet 2 09/03/2018 at Unknown time  . L-glutamine (ENDARI) 5 g PACK Powder Packet Take 5 g by mouth 2 (two) times daily. 180 each 1 06/26/2018 at Unknown time  . morphine (MS CONTIN) 15 MG 12 hr tablet Take 1 tablet (15 mg total) by mouth every 12 (twelve) hours for 30 days. 60 tablet 0 09/03/2018 at Unknown time  . Oxycodone HCl 10 MG TABS Take 1 tablet (10 mg total) by mouth every 4 (four) hours as needed for up to 15 days (pain). 90 tablet 0     Allergies: Patient has no known allergies. Past Medical History:  Diagnosis Date  . Anemia 03/30/2012   Hx of sickle cell disease  . CAP (community acquired pneumonia)    2014  . Cholelithiasis 03/30/2012  . Chronic wound of extremity 04/01/2012  . Elevated LFTs   . Leg ulcer ([place]) 10/27/2012   Chronic under care of wound clinic  . Multiple open wounds of lower extremity    chronic wounds B/LLE  . Sickle cell disease ([place])   . Sinus bradycardia by electrocardiogram 04/03/2012   Past Surgical History:  Procedure Laterality Date  . ALLOGRAFT APPLICATION Right 7/67/3419   Procedure: SURGICAL PREP FOR GRAFTING RIGHT LOWER EXTREMITY AND APPLICATION OF Jannifer Hick;  Surgeon: Irene Limbo, MD;  Location: [facility];  Service: Plastics;  Laterality: Right;  . APPLICATION OF A-CELL OF EXTREMITY Right 10/09/2015   Procedure: APPLICATION OF Jannifer Hick;  Surgeon: Irene Limbo, MD;  Location: [facility];  Service: Plastics;  Laterality: Right;  . BREAST SURGERY     left breast cyst aspiration  .  CESAREAN SECTION    . CESAREAN SECTION N/A 01/06/2014   Procedure: CESAREAN SECTION;  Surgeon: Emily Filbert, MD;  Location: [facility];  Service: Obstetrics;  Laterality: N/A;  . CESAREAN SECTION N/A 05/04/2017   Procedure: CESAREAN SECTION;  Surgeon: Woodroe Mode, MD;  Location: [facility];  Service: Obstetrics;  Laterality: N/A;  . I&D EXTREMITY Right 10/09/2015    Procedure: SURGICAL PREPARATION FOR GRAFTING RIGHT ANKLE AND APPLICATION THERASKIN;  Surgeon: Irene Limbo, MD;  Location: [facility];  Service: Plastics;  Laterality: Right;  . SKIN FULL THICKNESS GRAFT Bilateral 06/17/2016   Procedure: SURGICAL PREP FOR GRAFTING, BILATERAL LOWER EXTREMITIES AND APPLICATION OF Jannifer Hick;  Surgeon: Irene Limbo, MD;  Location: [facility];  Service: Plastics;  Laterality: Bilateral;  . TONSILLECTOMY     Family History  Problem Relation Age of Onset  . Sickle cell anemia Sister   . Diabetes Mother   . Asthma Mother   . Sickle cell trait Mother   . Sickle cell trait Father   . Hypertension Father    Social History   Socioeconomic History  . Marital status: Married    Spouse name: [name]  . Number of children: 1  . Years of education: Not on file  . Highest education level: Not on file  Occupational History  . Occupation: Employed in home care.  Social Needs  . Financial resource strain: Not on file  . Food insecurity:    Worry: Not on file    Inability: Not on file  . Transportation needs:    Medical: Not on file    Non-medical: Not on file  Tobacco Use  . Smoking status: Never Smoker  . Smokeless tobacco: Never Used  Substance and Sexual Activity  . Alcohol use: No  . Drug use: No  . Sexual activity: Yes    Birth control/protection: None  Lifestyle  . Physical activity:    Days per week: Not on file    Minutes per session: Not on file  . Stress: Not on file  Relationships  . Social connections:    Talks on phone: Not on file    Gets together: Not on file    Attends religious service: Not on file    Active member of club or organization: Not on file    Attends meetings of clubs or organizations: Not on file    Relationship status: Not on file  . Intimate partner violence:    Fear of current or ex partner: Not on file    Emotionally abused: Not on file    Physically abused: Not on file    Forced  sexual activity: Not on file  Other Topics Concern  . Not on file  Social History Narrative   Lives with husband.   Review of Systems  Constitutional: Negative.   HENT: Negative.   Respiratory: Negative for cough and hemoptysis.   Cardiovascular: Negative for chest pain and palpitations.  Gastrointestinal: Negative.   Genitourinary: Negative.   Musculoskeletal: Positive for joint pain (right knee pain).  Skin: Negative.   Neurological: Negative.   Endo/Heme/Allergies: Negative.   Psychiatric/Behavioral: Negative for depression and suicidal ideas.    Physical Exam Constitutional:      General: She is in acute distress.     Appearance: Normal appearance.  HENT:     Head: Normocephalic.     Nose: Nose normal.     Mouth/Throat:     Mouth: Mucous membranes are moist.  Pharynx: Oropharynx is clear.  Eyes:     Pupils: Pupils are equal, round, and reactive to light.  Cardiovascular:     Rate and Rhythm: Normal rate and regular rhythm.     Pulses: Normal pulses.     Heart sounds: Normal heart sounds.  Pulmonary:     Effort: Pulmonary effort is normal.  Abdominal:     General: Bowel sounds are normal.  Skin:    General: Skin is warm and dry.  Neurological:     General: No focal deficit present.     Mental Status: She is alert. Mental status is at baseline.  Psychiatric:        Mood and Affect: Mood normal.        Behavior: Behavior normal.        Thought Content: Thought content normal.        Judgment: Judgment normal.     Lab results: No results found for this or any previous visit (from the past 24 hour(s)).  Imaging results:  No results found.   Assessment & Plan:  Patient will be admitted to the day infusion center for extended observation  Initiate 0.45% saline at 100 ml/hour for cellular rehydration  Toradol 15 mg IV times one for inflammation.  Tylenol 1000 mg times one  Start Dilaudid PCA High Concentration per weight based protocol.     Patient will be re-evaluated for pain intensity in the context of function and relationship to baseline as care progresses.  If no significant pain relief, will transfer patient to inpatient services for a higher level of care.    Review CBC with differential, CMP, urine pregnancy, and reticulocytes as results become available   [name]  APRN, MSN, FNP-C [facility] [address] [place], [place] [address] [phone]  10/02/2018, 9:25 AM

## 2018-10-02 NOTE — Progress Notes (Signed)
CRITICAL VALUE ALERT  Critical Value:  Hemoglobin 6.7  Date & Time Notied:  10/02/18; 13:42  Provider Notified: Lorel Monaco. NP  Orders Received/Actions taken: No new orders at this time

## 2018-10-02 NOTE — Discharge Instructions (Signed)
Sickle Cell Anemia, Adult °Sickle cell anemia is a condition where your red blood cells are shaped like sickles. Red blood cells carry oxygen through the body. Sickle-shaped cells do not live as long as normal red blood cells. They also clump together and block blood from flowing through the blood vessels. This prevents the body from getting enough oxygen. Sickle cell anemia causes organ damage and pain. It also increases the risk of infection. °Follow these instructions at home: °Medicines °· Take over-the-counter and prescription medicines only as told by your doctor. °· If you were prescribed an antibiotic medicine, take it as told by your doctor. Do not stop taking the antibiotic even if you start to feel better. °· If you develop a fever, do not take medicines to lower the fever right away. Tell your doctor about the fever. °Managing pain, stiffness, and swelling °· Try these methods to help with pain: °? Use a heating pad. °? Take a warm bath. °? Distract yourself, such as by watching TV. °Eating and drinking °· Drink enough fluid to keep your pee (urine) clear or pale yellow. Drink more in hot weather and during exercise. °· Limit or avoid alcohol. °· Eat a healthy diet. Eat plenty of fruits, vegetables, whole grains, and lean protein. °· Take vitamins and supplements as told by your doctor. °Traveling °· When traveling, keep these with you: °? Your medical information. °? The names of your doctors. °? Your medicines. °· If you need to take an airplane, talk to your doctor first. °Activity °· Rest often. °· Avoid exercises that make your heart beat much faster, such as jogging. °General instructions °· Do not use products that have nicotine or tobacco, such as cigarettes and e-cigarettes. If you need help quitting, ask your doctor. °· Consider wearing a medical alert bracelet. °· Avoid being in high places (high altitudes), such as mountains. °· Avoid very hot or cold temperatures. °· Avoid places where the  temperature changes a lot. °· Keep all follow-up visits as told by your doctor. This is important. °Contact a doctor if: °· A joint hurts. °· Your feet or hands hurt or swell. °· You feel tired (fatigued). °Get help right away if: °· You have symptoms of infection. These include: °? Fever. °? Chills. °? Being very tired. °? Irritability. °? Poor eating. °? Throwing up (vomiting). °· You feel dizzy or faint. °· You have new stomach pain, especially on the left side. °· You have a an erection (priapism) that lasts more than 4 hours. °· You have numbness in your arms or legs. °· You have a hard time moving your arms or legs. °· You have trouble talking. °· You have pain that does not go away when you take medicine. °· You are short of breath. °· You are breathing fast. °· You have a long-term cough. °· You have pain in your chest. °· You have a bad headache. °· You have a stiff neck. °· Your stomach looks bloated even though you did not eat much. °· Your skin is pale. °· You suddenly cannot see well. °Summary °· Sickle cell anemia is a condition where your red blood cells are shaped like sickles. °· Follow your doctor's advice on ways to manage pain, food to eat, activities to do, and steps to take for safe travel. °· Get medical help right away if you have any signs of infection, such as a fever. °This information is not intended to replace advice given to you by your   health care provider. Make sure you discuss any questions you have with your health care provider. °Document Released: 02/24/2013 Document Revised: 06/11/2016 Document Reviewed: 06/11/2016 °Elsevier Interactive Patient Education © 2019 [employer] ° °

## 2018-10-02 NOTE — Progress Notes (Signed)
Patient admitted to the day infusion hospital for sickle cell pain crisis. Patient reported right knee pain rated 10/10. For pain management, patient placed on Dilaudid PCA, given 15 mg Toradol, 1000 mg Tylenol and hydrated with IV fluids.  At discharge, patient rated pain at  4/10. Vital signs stable. Discharge instructions given. Patient alert, oriented and ambulatory at discharge.

## 2018-10-02 NOTE — Telephone Encounter (Signed)
[name] called complaining about sickle cell pain in her right knee; rate pain 10/10.  Last medication taken was Oxycodone 10 mg at 2:30 am. Denies COVID-19 symptoms or being around anyone with symptoms. Denies fever, chest pain, N/V/D and abdominal pain. Verbalized understanding no visitors at clinic at this time and has mode of transportation. Chart reviewed with provider, and advise to come in for treatment.

## 2018-10-02 NOTE — Discharge Summary (Signed)
[facility] Discharge Summary   Patient ID: [name] MRN: [id number] DOB/AGE: [birth year] 48 y.o.  Admit date: 10/02/2018 Discharge date: 10/02/2018  Primary Care Physician:  [name], [place]  Admission Diagnoses:  Active Problems:   Sickle cell pain crisis [facility])   Discharge Medications:  Allergies as of 10/02/2018   No Known Allergies     Medication List    TAKE these medications   fluticasone 50 MCG/ACT nasal spray Commonly known as:  FLONASE Place 2 sprays into both nostrils daily.   folic acid 1 MG tablet Commonly known as:  FOLVITE Take 5 tablets (5 mg total) by mouth daily.   ibuprofen 600 MG tablet Commonly known as:  ADVIL TAKE 1 TABLET(600 MG) BY MOUTH EVERY 6 HOURS   L-glutamine 5 g Pack Powder Packet Commonly known as:  Endari Take 5 g by mouth 2 (two) times daily.   morphine 15 MG 12 hr tablet Commonly known as:  MS CONTIN Take 1 tablet (15 mg total) by mouth every 12 (twelve) hours for 30 days.   Oxycodone HCl 10 MG Tabs Take 1 tablet (10 mg total) by mouth every 4 (four) hours as needed for up to 15 days (pain).   Vitamin D 50 MCG (2000 UT) Caps Take 2 capsules (4,000 Units total) by mouth daily.        Consults:  None  Significant Diagnostic Studies:  No results found.   [facility] Course: [name], a 48 year old female with a medical history significant for sickle cell disease, chronic pain syndrome, opiate dependence, chronic bilateral leg ulcers, and anemia of chronic disease was admitted to sickle cell day infusion center for pain management and extended observation.  All laboratory values reviewed.  Hemoglobin 6.7, which is below patient's baseline.  Patient is typically not transfused unless hemoglobin is less than 6 g/dL, and/or symptomatic.  All other labs consistent with baseline. Patient afebrile and maintaining oxygen saturation at 99% on RA.  IV fluids initiated.  0.45% saline at  100 mL/h Tylenol 1000 mg p.o. x1 Toradol 15 mg IV x1 IV Dilaudid via PCA per weight-based protocol.  Settings of 0.5 mg, 10-minute lockout, and 3 mg/h.  Patient used a total of 9 mg with 25 demands and 18 delivered.  Pain intensity decreased to 5/10.  Patient states that she can manage at home on previously prescribed medication regimen. Patient advised to follow-up in primary care for medication management. Patient alert, oriented, and ambulating without assistance. Patient will discharge home in a hemodynamically stable condition.  Discharge instructions:  Resume all home medications.  Follow up with PCP as previously  scheduled.   Discussed the importance of drinking 64 ounces of water daily to  help prevent pain crises, it is important to drink plenty of water throughout the day. This is because dehydration of red blood cells may lead further sickling.   Avoid all stressors that precipitate sickle cell pain crisis.     The patient was given clear instructions to go to ER or return to medical center if symptoms do not improve, worsen or new problems develop.      Physical Exam at Discharge:  BP 129/67 (BP Location: Left Arm)   Pulse 78   Temp 98.4 F (36.9 C) (Oral)   Resp 15   LMP 09/20/2018   SpO2 99%  Physical Exam Constitutional:      Appearance: Normal appearance.  HENT:     Nose: Nose normal.  Mouth/Throat:     Mouth: Mucous membranes are moist.  Eyes:     General: Scleral icterus present.     Pupils: Pupils are equal, round, and reactive to light.  Cardiovascular:     Rate and Rhythm: Normal rate and regular rhythm.     Pulses: Normal pulses.     Heart sounds: Normal heart sounds.  Pulmonary:     Effort: Pulmonary effort is normal.     Breath sounds: Normal breath sounds.  Abdominal:     General: Abdomen is flat. Bowel sounds are normal.  Skin:    General: Skin is warm.  Neurological:     General: No focal deficit present.     Mental Status: She  is alert. Mental status is at baseline.  Psychiatric:        Mood and Affect: Mood normal.        Behavior: Behavior normal.        Thought Content: Thought content normal.        Judgment: Judgment normal.     Disposition at Discharge: Discharge disposition: 01-Home or Self Care       Discharge Orders: Discharge Instructions    Discharge patient   Complete by:  As directed    Discharge disposition:  01-Home or Self Care   Discharge patient date:  10/02/2018      Condition at Discharge:   Stable  Time spent on Discharge:  Greater than 30 minutes.  Signed: [name]  APRN, MSN, FNP-C [facility] [address] [place], [place] [address] [phone]  10/02/2018, 5:31 PM

## 2018-10-02 NOTE — Progress Notes (Signed)
Patient admitted to the day hospital for treatment of sickle cell pain crisis. Patient reported pain rated 9/10 in the legs . Patient placed on Dilaudid PCA, IV Toradol, IV Zofran and hydrated with IV fluids. At discharge patient reported  pain at 4/10. Discharge instructions given to patient. Patient alert, oriented and ambulatory at discharge.

## 2018-10-19 ENCOUNTER — Telehealth: Payer: Self-pay

## 2018-10-19 DIAGNOSIS — D571 Sickle-cell disease without crisis: Secondary | ICD-10-CM

## 2018-10-19 DIAGNOSIS — G894 Chronic pain syndrome: Secondary | ICD-10-CM

## 2018-10-19 MED ORDER — IBUPROFEN 600 MG PO TABS: ORAL_TABLET | ORAL | 2 refills | Status: DC

## 2018-10-19 MED ORDER — OXYCODONE HCL 10 MG PO TABS: 10.0000 mg | ORAL_TABLET | ORAL | 0 refills | Status: AC | PRN

## 2018-10-19 NOTE — Telephone Encounter (Signed)
Refill sent in to pharmacy 

## 2018-10-19 NOTE — Telephone Encounter (Signed)
Reviewed [facility] Substance Reporting system prior to prescribing opiate medications. No inconsistencies noted.   

## 2018-11-16 ENCOUNTER — Encounter (HOSPITAL_COMMUNITY): Payer: Self-pay

## 2018-11-16 ENCOUNTER — Telehealth (HOSPITAL_COMMUNITY): Payer: Self-pay | Admitting: General Practice

## 2018-11-16 ENCOUNTER — Non-Acute Institutional Stay (HOSPITAL_COMMUNITY)
Admission: AD | Admit: 2018-11-16 | Discharge: 2018-11-16 | Disposition: A | Discharge status: Home / Self Care | Payer: Medicaid Other | Source: Ambulatory Visit | Attending: Internal Medicine | Admitting: Internal Medicine

## 2018-11-16 DIAGNOSIS — M79601 Pain in right arm: Secondary | ICD-10-CM | POA: Insufficient documentation

## 2018-11-16 DIAGNOSIS — D638 Anemia in other chronic diseases classified elsewhere: Secondary | ICD-10-CM | POA: Insufficient documentation

## 2018-11-16 DIAGNOSIS — G894 Chronic pain syndrome: Secondary | ICD-10-CM | POA: Insufficient documentation

## 2018-11-16 DIAGNOSIS — E559 Vitamin D deficiency, unspecified: Secondary | ICD-10-CM | POA: Insufficient documentation

## 2018-11-16 DIAGNOSIS — Z79899 Other long term (current) drug therapy: Secondary | ICD-10-CM | POA: Insufficient documentation

## 2018-11-16 DIAGNOSIS — Z791 Long term (current) use of non-steroidal anti-inflammatories (NSAID): Secondary | ICD-10-CM | POA: Insufficient documentation

## 2018-11-16 DIAGNOSIS — Z832 Family history of diseases of the blood and blood-forming organs and certain disorders involving the immune mechanism: Secondary | ICD-10-CM | POA: Insufficient documentation

## 2018-11-16 DIAGNOSIS — Z7951 Long term (current) use of inhaled steroids: Secondary | ICD-10-CM | POA: Insufficient documentation

## 2018-11-16 DIAGNOSIS — F1120 Opioid dependence, uncomplicated: Secondary | ICD-10-CM | POA: Insufficient documentation

## 2018-11-16 DIAGNOSIS — M25511 Pain in right shoulder: Secondary | ICD-10-CM | POA: Diagnosis not present

## 2018-11-16 DIAGNOSIS — D5700 Hb-SS disease with crisis, unspecified: Secondary | ICD-10-CM | POA: Diagnosis present

## 2018-11-16 LAB — CBC WITH DIFFERENTIAL/PLATELET
Abs Immature Granulocytes: 0.17 10*3/uL — ABNORMAL HIGH (ref 0.00–0.07)
Basophils Absolute: 0.1 10*3/uL (ref 0.0–0.1)
Basophils Relative: 0 %
Eosinophils Absolute: 0.2 10*3/uL (ref 0.0–0.5)
Eosinophils Relative: 1 %
HCT: 19.7 % — ABNORMAL LOW (ref 36.0–46.0)
Hemoglobin: 6.7 g/dL — CL (ref 12.0–15.0)
Immature Granulocytes: 1 %
Lymphocytes Relative: 2 %
Lymphs Abs: 0.4 10*3/uL — ABNORMAL LOW (ref 0.7–4.0)
MCH: 32.7 pg (ref 26.0–34.0)
MCHC: 34 g/dL (ref 30.0–36.0)
MCV: 96.1 fL (ref 80.0–100.0)
Monocytes Absolute: 1.6 10*3/uL — ABNORMAL HIGH (ref 0.1–1.0)
Monocytes Relative: 9 %
Neutro Abs: 17 10*3/uL — ABNORMAL HIGH (ref 1.7–7.7)
Neutrophils Relative %: 87 %
Platelets: 346 10*3/uL (ref 150–400)
RBC: 2.05 MIL/uL — ABNORMAL LOW (ref 3.87–5.11)
RDW: 22.5 % — ABNORMAL HIGH (ref 11.5–15.5)
WBC: 19.4 10*3/uL — ABNORMAL HIGH (ref 4.0–10.5)
nRBC: 13.5 % — ABNORMAL HIGH (ref 0.0–0.2)

## 2018-11-16 LAB — RETICULOCYTES
Immature Retic Fract: 43.6 % — ABNORMAL HIGH (ref 2.3–15.9)
RBC.: 2.05 MIL/uL — ABNORMAL LOW (ref 3.87–5.11)
Retic Count, Absolute: 453.7 10*3/uL — ABNORMAL HIGH (ref 19.0–186.0)
Retic Ct Pct: 22.1 % — ABNORMAL HIGH (ref 0.4–3.1)

## 2018-11-16 LAB — COMPREHENSIVE METABOLIC PANEL
ALT: 18 U/L (ref 0–44)
AST: 23 U/L (ref 15–41)
Albumin: 4.5 g/dL (ref 3.5–5.0)
Alkaline Phosphatase: 65 U/L (ref 38–126)
Anion gap: 9 (ref 5–15)
BUN: 7 mg/dL (ref 6–20)
CO2: 27 mmol/L (ref 22–32)
Calcium: 8.6 mg/dL — ABNORMAL LOW (ref 8.9–10.3)
Chloride: 102 mmol/L (ref 98–111)
Creatinine, Ser: 0.46 mg/dL (ref 0.44–1.00)
GFR calc Af Amer: >60 mL/min (ref >60)
GFR calc non Af Amer: >60 mL/min (ref >60)
Glucose, Bld: 112 mg/dL — ABNORMAL HIGH (ref 70–99)
Potassium: 2.9 mmol/L — ABNORMAL LOW (ref 3.5–5.1)
Sodium: 138 mmol/L (ref 135–145)
Total Bilirubin: 6.3 mg/dL — ABNORMAL HIGH (ref 0.3–1.2)
Total Protein: 7.8 g/dL (ref 6.5–8.1)

## 2018-11-16 LAB — PREGNANCY, URINE: Preg Test, Ur: NEGATIVE

## 2018-11-16 LAB — PREPARE RBC (CROSSMATCH)

## 2018-11-16 MED ORDER — HYDROMORPHONE 1 MG/ML IV SOLN
INTRAVENOUS | Status: DC
  Administered 2018-11-16: 30 mg via INTRAVENOUS
  Administered 2018-11-16: 13.5 mg via INTRAVENOUS
  Filled 2018-11-16: qty 30

## 2018-11-16 MED ORDER — ONDANSETRON HCL 4 MG/2ML IJ SOLN: 4.0000 mg | Freq: Four times a day (QID) | INTRAMUSCULAR | Status: DC | PRN

## 2018-11-16 MED ORDER — SODIUM CHLORIDE 0.9 % IV SOLN
25.0000 mg | INTRAVENOUS | Status: DC | PRN
  Filled 2018-11-16: qty 0.5

## 2018-11-16 MED ORDER — ACETAMINOPHEN 500 MG PO TABS
1000.0000 mg | ORAL_TABLET | Freq: Once | ORAL | Status: AC
  Administered 2018-11-16: 1000 mg via ORAL
  Filled 2018-11-16: qty 2

## 2018-11-16 MED ORDER — NALOXONE HCL 0.4 MG/ML IJ SOLN: 0.4000 mg | INTRAMUSCULAR | Status: DC | PRN

## 2018-11-16 MED ORDER — DIPHENHYDRAMINE HCL 25 MG PO CAPS: 25.0000 mg | ORAL_CAPSULE | Freq: Once | ORAL | Status: DC

## 2018-11-16 MED ORDER — DIPHENHYDRAMINE HCL 25 MG PO CAPS
25.0000 mg | ORAL_CAPSULE | ORAL | Status: DC | PRN
  Administered 2018-11-16: 25 mg via ORAL
  Filled 2018-11-16: qty 1

## 2018-11-16 MED ORDER — DEXTROSE-NACL 5-0.45 % IV SOLN
INTRAVENOUS | Status: DC
  Administered 2018-11-16: 12:00:00 via INTRAVENOUS

## 2018-11-16 MED ORDER — KETOROLAC TROMETHAMINE 30 MG/ML IJ SOLN
15.0000 mg | Freq: Once | INTRAMUSCULAR | Status: AC
  Administered 2018-11-16: 15 mg via INTRAVENOUS
  Filled 2018-11-16: qty 1

## 2018-11-16 MED ORDER — SODIUM CHLORIDE 0.9% FLUSH: 9.0000 mL | INTRAVENOUS | Status: DC | PRN

## 2018-11-16 NOTE — Discharge Instructions (Signed)
Sickle Cell Anemia, Adult ° °Sickle cell anemia is a condition where your red blood cells are shaped like sickles. Red blood cells carry oxygen through the body. Sickle-shaped cells do not live as long as normal red blood cells. They also clump together and block blood from flowing through the blood vessels. This prevents the body from getting enough oxygen. Sickle cell anemia causes organ damage and pain. It also increases the risk of infection. °Follow these instructions at home: °Medicines °· Take over-the-counter and prescription medicines only as told by your doctor. °· If you were prescribed an antibiotic medicine, take it as told by your doctor. Do not stop taking the antibiotic even if you start to feel better. °· If you develop a fever, do not take medicines to lower the fever right away. Tell your doctor about the fever. °Managing pain, stiffness, and swelling °· Try these methods to help with pain: °? Use a heating pad. °? Take a warm bath. °? Distract yourself, such as by watching TV. °Eating and drinking °· Drink enough fluid to keep your pee (urine) clear or pale yellow. Drink more in hot weather and during exercise. °· Limit or avoid alcohol. °· Eat a healthy diet. Eat plenty of fruits, vegetables, whole grains, and lean protein. °· Take vitamins and supplements as told by your doctor. °Traveling °· When traveling, keep these with you: °? Your medical information. °? The names of your doctors. °? Your medicines. °· If you need to take an airplane, talk to your doctor first. °Activity °· Rest often. °· Avoid exercises that make your heart beat much faster, such as jogging. °General instructions °· Do not use products that have nicotine or tobacco, such as cigarettes and e-cigarettes. If you need help quitting, ask your doctor. °· Consider wearing a medical alert bracelet. °· Avoid being in high places (high altitudes), such as mountains. °· Avoid very hot or cold temperatures. °· Avoid places where the  temperature changes a lot. °· Keep all follow-up visits as told by your doctor. This is important. °Contact a doctor if: °· A joint hurts. °· Your feet or hands hurt or swell. °· You feel tired (fatigued). °Get help right away if: °· You have symptoms of infection. These include: °? Fever. °? Chills. °? Being very tired. °? Irritability. °? Poor eating. °? Throwing up (vomiting). °· You feel dizzy or faint. °· You have new stomach pain, especially on the left side. °· You have a an erection (priapism) that lasts more than 4 hours. °· You have numbness in your arms or legs. °· You have a hard time moving your arms or legs. °· You have trouble talking. °· You have pain that does not go away when you take medicine. °· You are short of breath. °· You are breathing fast. °· You have a long-term cough. °· You have pain in your chest. °· You have a bad headache. °· You have a stiff neck. °· Your stomach looks bloated even though you did not eat much. °· Your skin is pale. °· You suddenly cannot see well. °Summary °· Sickle cell anemia is a condition where your red blood cells are shaped like sickles. °· Follow your doctor's advice on ways to manage pain, food to eat, activities to do, and steps to take for safe travel. °· Get medical help right away if you have any signs of infection, such as a fever. °This information is not intended to replace advice given to you by   your health care provider. Make sure you discuss any questions you have with your health care provider. °Document Released: 02/24/2013 Document Revised: 08/28/2018 Document Reviewed: 06/11/2016 °Elsevier Patient Education © 2020 [employer] ° °

## 2018-11-16 NOTE — H&P (Signed)
[facility] History and Physical   Date: 11/16/2018  Patient name: [name] Medical record number: [id number] Date of birth: 10-01-1970 Age: 48 y.o. Gender: female PCP: [name], [place]  Attending physician: Tresa Garter, MD  Chief Complaint: Right upper extremity pain  History of Present Illness: [name], a 48 year old female with a medical history significant for sickle cell disease, type SS, chronic pain syndrome, opiate dependence, history of vitamin D deficiency, right lower extremity leg ulcers, and history of anemia of chronic disease presents complaining of worsening right upper extremity pain over the past 3 days.  Patient states that pain intensity has been increasing over the past several days.  She is been unable to control pain with home medications.  She last had oxycodone around 6:30 AM without sustained relief.  Pain intensity is 9/10 characterized as constant, throbbing, and occasionally sharp.  Pain is primarily localized to right shoulder. Patient denies recent travel, sick contacts, or exposure to COVID-19.  She also denies headache, dizziness, shortness of breath, chest pain, sore throat, dysuria, nausea, vomiting, or diarrhea.  Meds: Medications Prior to Admission  Medication Sig Dispense Refill Last Dose  . Cholecalciferol (VITAMIN D) 2000 units CAPS Take 2 capsules (4,000 Units total) by mouth daily. 30 capsule 11   . fluticasone (FLONASE) 50 MCG/ACT nasal spray Place 2 sprays into both nostrils daily. (Patient not taking: Reported on 05/28/3233) 16 g 6   . folic acid (FOLVITE) 1 MG tablet Take 5 tablets (5 mg total) by mouth daily. 150 tablet 11   . ibuprofen (ADVIL) 600 MG tablet TAKE 1 TABLET(600 MG) BY MOUTH EVERY 6 HOURS 60 tablet 2   . L-glutamine (ENDARI) 5 g PACK Powder Packet Take 5 g by mouth 2 (two) times daily. 180 each 1     Allergies: Patient has no known allergies. Past Medical History:  Diagnosis Date  . Anemia  03/30/2012   Hx of sickle cell disease  . CAP (community acquired pneumonia)    2014  . Cholelithiasis 03/30/2012  . Chronic wound of extremity 04/01/2012  . Elevated LFTs   . Leg ulcer ([place]) 10/27/2012   Chronic under care of wound clinic  . Multiple open wounds of lower extremity    chronic wounds B/LLE  . Sickle cell disease ([place])   . Sinus bradycardia by electrocardiogram 04/03/2012   Past Surgical History:  Procedure Laterality Date  . ALLOGRAFT APPLICATION Right 5/73/2202   Procedure: SURGICAL PREP FOR GRAFTING RIGHT LOWER EXTREMITY AND APPLICATION OF Jannifer Hick;  Surgeon: Irene Limbo, MD;  Location: [facility];  Service: Plastics;  Laterality: Right;  . APPLICATION OF A-CELL OF EXTREMITY Right 10/09/2015   Procedure: APPLICATION OF Jannifer Hick;  Surgeon: Irene Limbo, MD;  Location: [facility];  Service: Plastics;  Laterality: Right;  . BREAST SURGERY     left breast cyst aspiration  . CESAREAN SECTION    . CESAREAN SECTION N/A 01/06/2014   Procedure: CESAREAN SECTION;  Surgeon: Emily Filbert, MD;  Location: [facility];  Service: Obstetrics;  Laterality: N/A;  . CESAREAN SECTION N/A 05/04/2017   Procedure: CESAREAN SECTION;  Surgeon: Woodroe Mode, MD;  Location: [facility];  Service: Obstetrics;  Laterality: N/A;  . I&D EXTREMITY Right 10/09/2015   Procedure: SURGICAL PREPARATION FOR GRAFTING RIGHT ANKLE AND APPLICATION THERASKIN;  Surgeon: Irene Limbo, MD;  Location: [facility];  Service: Plastics;  Laterality: Right;  . SKIN FULL THICKNESS GRAFT Bilateral 06/17/2016  Procedure: SURGICAL PREP FOR GRAFTING, BILATERAL LOWER EXTREMITIES AND APPLICATION OF Jannifer Hick;  Surgeon: Irene Limbo, MD;  Location: [facility];  Service: Plastics;  Laterality: Bilateral;  . TONSILLECTOMY     Family History  Problem Relation Age of Onset  . Sickle cell anemia Sister   . Diabetes Mother   . Asthma Mother   . Sickle cell trait Mother    . Sickle cell trait Father   . Hypertension Father    Social History   Socioeconomic History  . Marital status: Married    Spouse name: [name]  . Number of children: 1  . Years of education: Not on file  . Highest education level: Not on file  Occupational History  . Occupation: Employed in home care.  Social Needs  . Financial resource strain: Not on file  . Food insecurity    Worry: Not on file    Inability: Not on file  . Transportation needs    Medical: Not on file    Non-medical: Not on file  Tobacco Use  . Smoking status: Never Smoker  . Smokeless tobacco: Never Used  Substance and Sexual Activity  . Alcohol use: No  . Drug use: No  . Sexual activity: Yes    Birth control/protection: None  Lifestyle  . Physical activity    Days per week: Not on file    Minutes per session: Not on file  . Stress: Not on file  Relationships  . Social Herbalist on phone: Not on file    Gets together: Not on file    Attends religious service: Not on file    Active member of club or organization: Not on file    Attends meetings of clubs or organizations: Not on file    Relationship status: Not on file  . Intimate partner violence    Fear of current or ex partner: Not on file    Emotionally abused: Not on file    Physically abused: Not on file    Forced sexual activity: Not on file  Other Topics Concern  . Not on file  Social History Narrative   Lives with husband.   Review of Systems  Constitutional: Negative for chills and fever.  HENT: Negative.   Eyes: Negative.   Respiratory: Negative for sputum production and shortness of breath.   Cardiovascular: Negative.  Negative for chest pain and palpitations.  Gastrointestinal: Negative.  Negative for abdominal pain and vomiting.  Genitourinary: Negative for dysuria and urgency.  Musculoskeletal: Negative.   Skin: Negative.   Neurological: Negative.   Psychiatric/Behavioral: Negative.    Physical  Exam Constitutional:      Appearance: Normal appearance.  HENT:     Head: Normocephalic.     Mouth/Throat:     Mouth: Mucous membranes are moist.     Pharynx: Oropharynx is clear.  Eyes:     Pupils: Pupils are equal, round, and reactive to light.  Cardiovascular:     Rate and Rhythm: Normal rate and regular rhythm.     Pulses: Normal pulses.     Heart sounds: Normal heart sounds.  Pulmonary:     Effort: No respiratory distress.  Abdominal:     General: Abdomen is flat. Bowel sounds are normal. There is no distension.  Musculoskeletal: Normal range of motion.  Skin:    General: Skin is warm.     Comments: Right lower extremity leg ulcers, currently unable to assess due to dressing  Neurological:  General: No focal deficit present.     Mental Status: She is alert. Mental status is at baseline.  Psychiatric:        Mood and Affect: Mood normal.        Behavior: Behavior normal.        Thought Content: Thought content normal.        Judgment: Judgment normal.      Lab results: No results found for this or any previous visit (from the past 24 hour(s)).  Imaging results:  No results found.   Assessment & Plan: Patient admitted to sickle cell day infusion center for management of pain crisis.  Patient is opiate tolerant, initiate IV Dilaudid via PCA per weight-based protocol.  Settings of 0.5 mg, 10-minute lockout, and 3 mg/h. IV Toradol 15 mg x 1 Tylenol 1000 mg by mouth x1 Initiate IV fluids D5 0.45% saline at 100 mL/h. Patient has a history of anemia of chronic disease, she is typically not transfused unless hemoglobin is less than 6.0 g/dL and/or symptomatic. Patient's pain intensity will be reevaluated in the context of functioning and relationship to baseline as her care progresses. If pain intensity remains increased, admit to inpatient services for higher level of care.  [name]  APRN, MSN, FNP-C Patient [facility] [address] [place], [place] [address] [phone]  11/16/2018, 10:38 AM

## 2018-11-16 NOTE — Progress Notes (Signed)
Patient admitted to day hospital for treatment of sickle cell crisis. At admission patient reported pain 10/10.  For pain management patient placed on Dilaudid PCA and IVF. Patient also received 15 mg of Toradol, 1000 mg of Tylenol and 25 mg of Benadryl. Patient also received 1 unit PRBC for a hgb of 6.7. H&H draw 30 min post transfusion. Patient instructed to return on Thursday, 7/2 for a repeat lab draw.  At the time of discharge patient reported pain 5/10. Vitals were stable. Patient alert, oriented and ambulatory at time of discharge.

## 2018-11-16 NOTE — Progress Notes (Signed)
CRITICAL VALUE ALERT  Critical Value:  Hgb 6.7  Date & Time Notied:  11/16/18 1220  Provider Notified: [name], FNP-C  Orders Received/Actions taken: Will transfuse PRBC

## 2018-11-16 NOTE — Telephone Encounter (Signed)
Patient called, complained of pain in the right arm rated at 10/10. Denied chest pain, fever, diarrhea, abdominal pain, nausea/vomitting. Screened negative for Covid-19 symptoms. Admitted to having means of transportation without driving self after treatment. Last took Ibuprofen 600 mg and oxycodone 10 mg at 05:00 am today. Per provider, patient can come to the day hospital for treatment. Patient notified, verbalized understanding.

## 2018-11-17 ENCOUNTER — Telehealth (HOSPITAL_COMMUNITY): Payer: Self-pay | Admitting: Family Medicine

## 2018-11-17 ENCOUNTER — Telehealth: Payer: Self-pay

## 2018-11-17 DIAGNOSIS — E876 Hypokalemia: Secondary | ICD-10-CM

## 2018-11-17 LAB — TYPE AND SCREEN
ABO/RH(D): B POS
Antibody Screen: NEGATIVE
Unit division: 0

## 2018-11-17 LAB — BPAM RBC
Blood Product Expiration Date: 202008012359
ISSUE DATE / TIME: 202006291450
Unit Type and Rh: 9500

## 2018-11-17 MED ORDER — POTASSIUM CHLORIDE ER 20 MEQ PO TBCR: 40.0000 meq | EXTENDED_RELEASE_TABLET | Freq: Every day | ORAL | 0 refills | Status: DC

## 2018-11-17 NOTE — Telephone Encounter (Signed)
[name], a 48 year old female with a medical history significant for sickle cell anemia, opiate dependence, chronic pain syndrome, chronic leg ulcers, and anemia of chronic disease was admitted to sickle cell day infusion center on 11/16/18 for sickle cell pain crisis. Patient was transfused 1 unit of PRBCs for a hemoglobin of 6.7. Also, patient had a potassium of 2.9. Patient started on the following medication for hypokalemia and will follow up with PCP on 11/18/2018 to repeat BMP and CBC with differential:   Meds ordered this encounter  Medications  . potassium chloride 20 MEQ TBCR    Sig: Take 40 mEq by mouth daily for 10 days.    Dispense:  20 tablet    Refill:  0    Order Specific Question:   Supervising Provider    Answer:   Tresa Garter [[id number]]    [name]  APRN, MSN, FNP-C Patient [facility] [address] [place], [place] [address] [phone]

## 2018-11-17 NOTE — Discharge Summary (Signed)
[facility] Discharge Summary   Patient ID: [name] MRN: [id number] DOB/AGE: [birth year] 47 y.o.  Admit date: 11/16/2018 Discharge date: 11/17/2018  Primary Care Physician:  [name], [place]  Admission Diagnoses:  Active Problems:   Sickle cell pain crisis [facility])   Discharge Medications:  Allergies as of 11/16/2018   No Known Allergies     Medication List    TAKE these medications   fluticasone 50 MCG/ACT nasal spray Commonly known as: FLONASE Place 2 sprays into both nostrils daily.   folic acid 1 MG tablet Commonly known as: FOLVITE Take 5 tablets (5 mg total) by mouth daily.   ibuprofen 600 MG tablet Commonly known as: ADVIL TAKE 1 TABLET(600 MG) BY MOUTH EVERY 6 HOURS   L-glutamine 5 g Pack Powder Packet Commonly known as: Endari Take 5 g by mouth 2 (two) times daily.   Vitamin D 50 MCG (2000 UT) Caps Take 2 capsules (4,000 Units total) by mouth daily.        Consults:  None  Significant Diagnostic Studies:  No results found. History of present illness: [name], a 48 year old female with a medical history significant for sickle cell disease, type SS, chronic pain syndrome, opiate dependence, history of vitamin D deficiency, right lower extremity leg ulcers, and history of anemia of chronic disease presents complaining of worsening right upper extremity pain over the past 3 days.  Patient states that pain intensity has been increasing over the past several days.  She is been unable to control pain with home medications.  She last had oxycodone around 6:30 AM without sustained relief.  Pain intensity is 9/10 characterized as constant, throbbing, and occasionally sharp.  Pain is primarily localized to right shoulder. Patient denies recent travel, sick contacts, or exposure to COVID-19.  She also denies headache, dizziness, shortness of breath, chest pain, sore throat, dysuria, nausea, vomiting, or diarrhea.   [facility] Course: Patient was admitted to sickle cell day infusion center for sickle cell pain crisis and extended observation. All laboratory values reviewed.  Hemoglobin decreased to 6.7, which is below patient's baseline.  Patient was transfused 1 unit of PRBCs during admission.  Patient will return to clinic on 11/20/2018 to repeat labs. Also, WBCs 19.4.  Suspected to be reactive.  Patient afebrile. Patient's oxygen saturation is 100% on RA.  Pain managed with IV Dilaudid via PCA with settings of 0.5 mg, 10-minute lockout, and 3 mg/h.  Also, Toradol 15 mg IV x1 and Tylenol 1000 mg by mouth x1. IV fluids initiated, D5 0.45% saline at 100 mL/h.  Pain intensity decreased to 5/10.  No admission warranted on today.  Patient states that she can manage at home on current medication regimen. Patient to follow-up in primary care on 11/18/2018 to repeat potassium level and CBC.  Patient expressed understanding.  Patient alert, oriented, and ambulating without assistance.  She will discharge home in a hemodynamically stable condition.  Discharge instructions: Resume all home medications.  Follow up with PCP as previously  scheduled.   Discussed the importance of drinking 64 ounces of water daily to  help prevent pain crises, it is important to drink plenty of water throughout the day. This is because dehydration of red blood cells may lead further sickling.   Avoid all stressors that precipitate sickle cell pain crisis.     The patient was given clear instructions to go to ER or return to medical center if symptoms do not improve, worsen or new  problems develop.       Physical Exam at Discharge:  BP 115/77 (BP Location: Left Arm)   Pulse 75   Temp 98.7 F (37.1 C) (Oral)   Resp 16   Wt 129 lb (58.5 kg)   LMP 11/07/2018 (Approximate)   SpO2 100%   BMI 20.20 kg/m   Physical Exam HENT:     Mouth/Throat:     Mouth: Mucous membranes are moist.  Eyes:     General: Scleral icterus present.      Pupils: Pupils are equal, round, and reactive to light.  Neck:     Musculoskeletal: Muscular tenderness present.  Cardiovascular:     Rate and Rhythm: Normal rate and regular rhythm.     Pulses: Normal pulses.  Pulmonary:     Effort: Pulmonary effort is normal.  Abdominal:     General: Abdomen is flat. Bowel sounds are normal.  Musculoskeletal: Normal range of motion.  Skin:    General: Skin is warm.  Neurological:     Mental Status: She is alert.  Psychiatric:        Mood and Affect: Mood normal.        Behavior: Behavior normal.        Thought Content: Thought content normal.      Disposition at Discharge: Discharge disposition: 01-Home or Self Care       Discharge Orders: Discharge Instructions    Discharge patient   Complete by: As directed    Discharge disposition: 01-Home or Self Care   Discharge patient date: 11/16/2018      Condition at Discharge:   Stable  Time spent on Discharge:  Greater than 30 minutes.  Signed:  [name]  APRN, MSN, FNP-C Patient [facility] [address] [place], [place] [address] [phone]  11/17/2018, 4:00 PM

## 2018-11-18 ENCOUNTER — Encounter (HOSPITAL_COMMUNITY): Payer: Self-pay | Admitting: Family Medicine

## 2018-11-18 ENCOUNTER — Other Ambulatory Visit: Payer: Self-pay

## 2018-11-18 ENCOUNTER — Encounter: Payer: Self-pay | Admitting: Family Medicine

## 2018-11-18 ENCOUNTER — Ambulatory Visit (INDEPENDENT_AMBULATORY_CARE_PROVIDER_SITE_OTHER): Payer: Medicaid Other | Admitting: Family Medicine

## 2018-11-18 VITALS — BP 137/81 | HR 87 | Temp 99.0°F | Resp 16 | Ht 67.0 in | Wt 129.0 lb

## 2018-11-18 DIAGNOSIS — R220 Localized swelling, mass and lump, head: Secondary | ICD-10-CM

## 2018-11-18 DIAGNOSIS — D571 Sickle-cell disease without crisis: Secondary | ICD-10-CM

## 2018-11-18 DIAGNOSIS — G894 Chronic pain syndrome: Secondary | ICD-10-CM | POA: Diagnosis not present

## 2018-11-18 LAB — POCT URINALYSIS DIPSTICK
Blood, UA: NEGATIVE
Glucose, UA: NEGATIVE
Ketones, UA: NEGATIVE
Leukocytes, UA: NEGATIVE
Nitrite, UA: NEGATIVE
Protein, UA: POSITIVE — AB
Spec Grav, UA: 1.01 (ref 1.010–1.025)
Urobilinogen, UA: 4 E.U./dL — AB
pH, UA: 6 (ref 5.0–8.0)

## 2018-11-18 MED ORDER — METHYLPREDNISOLONE SODIUM SUCC 125 MG IJ SOLR
62.5000 mg | Freq: Once | INTRAMUSCULAR | Status: AC
  Administered 2018-11-18: 62.5 mg via INTRAMUSCULAR

## 2018-11-18 MED ORDER — CEFTRIAXONE SODIUM 1 G IJ SOLR
1.0000 g | Freq: Once | INTRAMUSCULAR | Status: AC
  Administered 2018-11-18: 09:00:00 1 g via INTRAMUSCULAR

## 2018-11-18 MED ORDER — OXYCODONE HCL 10 MG PO TABS: 10.0000 mg | ORAL_TABLET | ORAL | 0 refills | Status: DC | PRN

## 2018-11-18 MED ORDER — CLINDAMYCIN HCL 300 MG PO CAPS: 300.0000 mg | ORAL_CAPSULE | Freq: Three times a day (TID) | ORAL | 0 refills | Status: AC

## 2018-11-18 NOTE — Progress Notes (Signed)
[name], a 48 year old female with a medical history significant for sickle cell disease, chronic pain syndrome, opiate dependence, chronic right lower extremity ulcers, and anemia of chronic disease presented to PCP complaining of facial swelling, sore throat, and neck pain in the presence of sickle cell pain crisis.  Facial swelling, sore throat, and neck pain is a new finding.  Recommend that patient transitions to emergency department for further work-up of new symptoms.  Patient is not appropriate for sickle cell day infusion center treatment on today, she warrants further work-up and possibly higher level of care.  Patient's PCP and I discussed her current condition at length and agreed that patient will need an ED work-up.    Patient was treated in sickle cell day infusion center on 11/16/2018 for sickle cell pain crisis.  Pain was primarily to right shoulder and right lower extremity.  Patient also had a hemoglobin of 6.3, which was below her baseline.  She was transfused 1 unit of PRBCs.  On 11/16/2018, patient's potassium was 2.9.  Repleted with K-dur 40 mEq.  [name]  APRN, MSN, FNP-C Patient [facility] [address] [place], [place] [address] [phone]

## 2018-11-18 NOTE — Progress Notes (Signed)
Patient [facility]   Progress Note: Sick Visit Provider: [name], FNP  SUBJECTIVE:   [name] is a 48 y.o. female who  has a past medical history of Anemia (03/30/2012), CAP (community acquired pneumonia), Cholelithiasis (03/30/2012), Chronic wound of extremity (04/01/2012), Elevated LFTs, Leg ulcer ([place]) (10/27/2012), Multiple open wounds of lower extremity, Sickle cell disease ([place]), and Sinus bradycardia by electrocardiogram (04/03/2012).. Patient presents today for Facial Swelling (started swelling yesterday ), Facial Pain, and Medication Refill (oxycodone )  Patient presents with facial pain and swelling x 1 day. She reports that she is having difficulty with eating due to not being able to open her mouth. She denies difficulty with respirations. She states that the pain started on the right upper extremity. She was admitted to the day hospital for pain management and observation. Found to be hypokalemic with a HGB below baseline. She was transfused and given potassium.  Patient states that her swelling has increased since yesterday.  Review of Systems  Constitutional: Positive for chills.  HENT: Positive for ear pain. Negative for sore throat.        Facial swelling bilaterally  Eyes: Negative.   Respiratory: Negative.   Cardiovascular: Negative.   Musculoskeletal: Positive for joint pain and myalgias.  Neurological: Negative.   Psychiatric/Behavioral: Negative.     OBJECTIVE: BP 137/81 (BP Location: Left Arm, Patient Position: Sitting, Cuff Size: Normal)   Pulse 87   Temp 99 F (37.2 C) (Oral)   Resp 16   Ht 5\' 7"  (1.702 m)   Wt 129 lb (58.5 kg)   LMP 11/07/2018 (Approximate)   SpO2 100%   BMI 20.20 kg/m   Wt Readings from Last 3 Encounters:  11/18/18 129 lb (58.5 kg)  11/16/18 129 lb (58.5 kg)  06/28/18 126 lb 8.7 oz (57.4 kg)     Physical Exam Vitals signs and nursing note reviewed.  Constitutional:    General: She is not in acute distress.    Appearance: Normal appearance. She is ill-appearing.  HENT:     Head: Normocephalic and atraumatic.     Right Ear: Tympanic membrane, ear canal and external ear normal.     Left Ear: Tympanic membrane, ear canal and external ear normal.     Nose: Nose normal.     Mouth/Throat:     Comments: Difficulty opening mouth due to pain and swelling.  Eyes:     Extraocular Movements: Extraocular movements intact.     Conjunctiva/sclera: Conjunctivae normal.     Pupils: Pupils are equal, round, and reactive to light.  Cardiovascular:     Rate and Rhythm: Normal rate and regular rhythm.     Heart sounds: No murmur.  Pulmonary:     Effort: Pulmonary effort is normal.     Breath sounds: Normal breath sounds.  Musculoskeletal: Normal range of motion.  Skin:    General: Skin is warm and dry.  Neurological:     Mental Status: She is alert and oriented to person, place, and time.  Psychiatric:        Mood and Affect: Mood normal.        Behavior: Behavior normal.        Thought Content: Thought content normal.        Judgment: Judgment normal.           ASSESSMENT/PLAN:   1. Hb-SS disease without crisis ([place]) Discussed with L. Smith Robert, NP. Due to facial swelling of unknown etiology, she is  not a candidate for admission to the day hospital. Recommend ED evaluation.  - CBC with Differential - Basic Metabolic Panel - Oxycodone HCl 10 MG TABS; Take 1 tablet (10 mg total) by mouth every 4 (four) hours as needed for up to 15 days (pain).  Dispense: 90 tablet; Refill: 0  2. Chronic pain syndrome Medications refilled.  - Urinalysis Dipstick  3. Facial swelling Patient refusing to go to the ED. Respiratory status is stable at the present time. Discussed the need to go to the ED for possible imaging of the head.  Will treat with steroid and antibiotics in the clinic. AMA form signed by the patient.  - methylPREDNISolone sodium succinate (SOLU-MEDROL)  125 mg/2 mL injection 62.5 mg - clindamycin (CLEOCIN) 300 MG capsule; Take 1 capsule (300 mg total) by mouth 3 (three) times daily for 10 days.  Dispense: 30 capsule; Refill: 0 - cefTRIAXone (ROCEPHIN) injection 1 g         The patient was given clear instructions to go to ER or return to medical center if symptoms do not improve, worsen or new problems develop. The patient verbalized understanding and agreed with plan of care.   Ms. Doug Sou. Nathaneil Canary, FNP-BC [facility] [address] [place], [place] [address] [phone]     This note has been created with Dragon speech recognition software and smart phrase technology. Any transcriptional errors are unintentional.

## 2018-11-18 NOTE — Patient Instructions (Signed)
I recommend that you go to the Emergency Department for further evaluation.  I have given a steroid and antibiotics to help with the swelling. This is a temporary solution. You need further treatment.   Sickle Cell Anemia, Adult  Sickle cell anemia is a condition where your red blood cells are shaped like sickles. Red blood cells carry oxygen through the body. Sickle-shaped cells do not live as long as normal red blood cells. They also clump together and block blood from flowing through the blood vessels. This prevents the body from getting enough oxygen. Sickle cell anemia causes organ damage and pain. It also increases the risk of infection. Follow these instructions at home: Medicines  Take over-the-counter and prescription medicines only as told by your doctor.  If you were prescribed an antibiotic medicine, take it as told by your doctor. Do not stop taking the antibiotic even if you start to feel better.  If you develop a fever, do not take medicines to lower the fever right away. Tell your doctor about the fever. Managing pain, stiffness, and swelling  Try these methods to help with pain: ? Use a heating pad. ? Take a warm bath. ? Distract yourself, such as by watching TV. Eating and drinking  Drink enough fluid to keep your pee (urine) clear or pale yellow. Drink more in hot weather and during exercise.  Limit or avoid alcohol.  Eat a healthy diet. Eat plenty of fruits, vegetables, whole grains, and lean protein.  Take vitamins and supplements as told by your doctor. Traveling  When traveling, keep these with you: ? Your medical information. ? The names of your doctors. ? Your medicines.  If you need to take an airplane, talk to your doctor first. Activity  Rest often.  Avoid exercises that make your heart beat much faster, such as jogging. General instructions  Do not use products that have nicotine or tobacco, such as cigarettes and e-cigarettes. If you need help  quitting, ask your doctor.  Consider wearing a medical alert bracelet.  Avoid being in high places (high altitudes), such as mountains.  Avoid very hot or cold temperatures.  Avoid places where the temperature changes a lot.  Keep all follow-up visits as told by your doctor. This is important. Contact a doctor if:  A joint hurts.  Your feet or hands hurt or swell.  You feel tired (fatigued). Get help right away if:  You have symptoms of infection. These include: ? Fever. ? Chills. ? Being very tired. ? Irritability. ? Poor eating. ? Throwing up (vomiting).  You feel dizzy or faint.  You have new stomach pain, especially on the left side.  You have a an erection (priapism) that lasts more than 4 hours.  You have numbness in your arms or legs.  You have a hard time moving your arms or legs.  You have trouble talking.  You have pain that does not go away when you take medicine.  You are short of breath.  You are breathing fast.  You have a long-term cough.  You have pain in your chest.  You have a bad headache.  You have a stiff neck.  Your stomach looks bloated even though you did not eat much.  Your skin is pale.  You suddenly cannot see well. Summary  Sickle cell anemia is a condition where your red blood cells are shaped like sickles.  Follow your doctor's advice on ways to manage pain, food to eat, activities to do,  and steps to take for safe travel.  Get medical help right away if you have any signs of infection, such as a fever. This information is not intended to replace advice given to you by your health care provider. Make sure you discuss any questions you have with your health care provider. Document Released: 02/24/2013 Document Revised: 08/28/2018 Document Reviewed: 06/11/2016 Elsevier Patient Education  [place]. Angioedema  Angioedema is sudden swelling in the body. The swelling can happen in any part of the body. It often  happens on the skin and causes itchy, bumpy patches (hives) to form. This condition may:  Happen only one time.  Happen more than one time. It may come back at random times.  Keep coming back for a number of years. Someday it may stop coming back. Follow these instructions at home:  Take over-the-counter and prescription medicines only as told by your doctor.  If you were given medicines for emergency allergy treatment, always carry them with you.  Wear a medical bracelet as told by your doctor.  Avoid the things that cause your attacks (triggers).  If this condition was passed to you from your parents and you want to have kids, talk to your doctor. Your kids may also have this condition. Contact a doctor if:  You have another attack.  Your attacks happen more often, even after you take steps to prevent them.  This condition was passed to you by your parents and you want to have kids. Get help right away if:  Your mouth, tongue, or lips get very swollen.  You have trouble breathing.  You have trouble swallowing.  You pass out (faint). This information is not intended to replace advice given to you by your health care provider. Make sure you discuss any questions you have with your health care provider. Document Released: 04/24/2009 Document Revised: 04/18/2017 Document Reviewed: 11/14/2015 Elsevier Patient Education  2020 [employer].

## 2018-11-19 LAB — CBC WITH DIFFERENTIAL/PLATELET
Basophils Absolute: 0.1 10*3/uL (ref 0.0–0.2)
Basos: 1 %
EOS (ABSOLUTE): 0.4 10*3/uL (ref 0.0–0.4)
Eos: 3 %
Hematocrit: 22.4 % — ABNORMAL LOW (ref 34.0–46.6)
Hemoglobin: 7.4 g/dL — ABNORMAL LOW (ref 11.1–15.9)
Immature Grans (Abs): 0.1 10*3/uL (ref 0.0–0.1)
Immature Granulocytes: 1 %
Lymphocytes Absolute: 0.8 10*3/uL (ref 0.7–3.1)
Lymphs: 6 %
MCH: 29.8 pg (ref 26.6–33.0)
MCHC: 33 g/dL (ref 31.5–35.7)
MCV: 90 fL (ref 79–97)
Monocytes Absolute: 1.2 10*3/uL — ABNORMAL HIGH (ref 0.1–0.9)
Monocytes: 8 %
NRBC: 4 % — ABNORMAL HIGH (ref 0–0)
Neutrophils Absolute: 11.2 10*3/uL — ABNORMAL HIGH (ref 1.4–7.0)
Neutrophils: 81 %
Platelets: 414 10*3/uL (ref 150–450)
RBC: 2.48 x10E6/uL — CL (ref 3.77–5.28)
RDW: 17.5 % — ABNORMAL HIGH (ref 11.7–15.4)
WBC: 13.7 10*3/uL — ABNORMAL HIGH (ref 3.4–10.8)

## 2018-11-19 LAB — BASIC METABOLIC PANEL
BUN/Creatinine Ratio: 14 (ref 9–23)
BUN: 6 mg/dL (ref 6–24)
CO2: 22 mmol/L (ref 20–29)
Calcium: 8.8 mg/dL (ref 8.7–10.2)
Chloride: 98 mmol/L (ref 96–106)
Creatinine, Ser: 0.44 mg/dL — ABNORMAL LOW (ref 0.57–1.00)
GFR calc Af Amer: 139 mL/min/{1.73_m2} (ref >59)
GFR calc non Af Amer: 121 mL/min/{1.73_m2} (ref >59)
Glucose: 94 mg/dL (ref 65–99)
Potassium: 3.6 mmol/L (ref 3.5–5.2)
Sodium: 136 mmol/L (ref 134–144)

## 2018-11-19 NOTE — Progress Notes (Signed)
The  labs are stable without significant clinical change.  All other results are normal or within acceptable limits. No Medication changes   

## 2018-11-22 NOTE — Telephone Encounter (Signed)
Message sent to provider 

## 2018-12-16 ENCOUNTER — Ambulatory Visit: Payer: Medicaid Other | Admitting: Family Medicine

## 2018-12-22 ENCOUNTER — Telehealth: Payer: Self-pay

## 2018-12-22 DIAGNOSIS — D571 Sickle-cell disease without crisis: Secondary | ICD-10-CM

## 2018-12-23 MED ORDER — OXYCODONE HCL 10 MG PO TABS: 10.0000 mg | ORAL_TABLET | ORAL | 0 refills | Status: DC | PRN

## 2018-12-23 NOTE — Telephone Encounter (Signed)
refilled 

## 2019-01-13 ENCOUNTER — Other Ambulatory Visit: Payer: Self-pay | Admitting: Family Medicine

## 2019-01-13 DIAGNOSIS — D571 Sickle-cell disease without crisis: Secondary | ICD-10-CM

## 2019-01-19 ENCOUNTER — Telehealth: Payer: Self-pay

## 2019-01-19 DIAGNOSIS — D571 Sickle-cell disease without crisis: Secondary | ICD-10-CM

## 2019-01-20 MED ORDER — OXYCODONE HCL 10 MG PO TABS: 10.0000 mg | ORAL_TABLET | ORAL | 0 refills | Status: DC | PRN

## 2019-01-20 NOTE — Telephone Encounter (Signed)
Refilled

## 2019-01-27 ENCOUNTER — Encounter (HOSPITAL_COMMUNITY): Payer: Self-pay

## 2019-01-27 ENCOUNTER — Encounter (HOSPITAL_COMMUNITY): Payer: Self-pay | Admitting: *Deleted

## 2019-02-18 ENCOUNTER — Telehealth: Payer: Self-pay | Admitting: Internal Medicine

## 2019-02-18 ENCOUNTER — Other Ambulatory Visit: Payer: Self-pay | Admitting: Internal Medicine

## 2019-02-18 DIAGNOSIS — D571 Sickle-cell disease without crisis: Secondary | ICD-10-CM

## 2019-02-18 MED ORDER — OXYCODONE HCL 10 MG PO TABS: 10.0000 mg | ORAL_TABLET | ORAL | 0 refills | Status: AC | PRN

## 2019-02-18 NOTE — Telephone Encounter (Signed)
Refilled

## 2019-02-18 NOTE — Telephone Encounter (Signed)
Refill request for oxycodone.  

## 2019-03-02 ENCOUNTER — Other Ambulatory Visit: Payer: Self-pay | Admitting: Family Medicine

## 2019-03-02 ENCOUNTER — Ambulatory Visit (HOSPITAL_COMMUNITY)
Admission: RE | Admit: 2019-03-02 | Discharge: 2019-03-02 | Disposition: A | Discharge status: Home / Self Care | Payer: Medicaid Other | Source: Ambulatory Visit | Attending: Internal Medicine | Admitting: Internal Medicine

## 2019-03-02 ENCOUNTER — Other Ambulatory Visit: Payer: Self-pay

## 2019-03-02 ENCOUNTER — Ambulatory Visit (INDEPENDENT_AMBULATORY_CARE_PROVIDER_SITE_OTHER): Payer: Medicaid Other | Admitting: Family Medicine

## 2019-03-02 ENCOUNTER — Encounter: Payer: Self-pay | Admitting: Family Medicine

## 2019-03-02 VITALS — BP 134/70 | HR 77 | Temp 98.6°F | Resp 14 | Ht 67.0 in | Wt 136.0 lb

## 2019-03-02 DIAGNOSIS — D571 Sickle-cell disease without crisis: Secondary | ICD-10-CM | POA: Diagnosis present

## 2019-03-02 DIAGNOSIS — L97312 Non-pressure chronic ulcer of right ankle with fat layer exposed: Secondary | ICD-10-CM

## 2019-03-02 DIAGNOSIS — L97919 Non-pressure chronic ulcer of unspecified part of right lower leg with unspecified severity: Secondary | ICD-10-CM | POA: Diagnosis not present

## 2019-03-02 LAB — CBC
HCT: 20.4 % — ABNORMAL LOW (ref 36.0–46.0)
Hemoglobin: 6.8 g/dL — CL (ref 12.0–15.0)
MCH: 28.5 pg (ref 26.0–34.0)
MCHC: 33.3 g/dL (ref 30.0–36.0)
MCV: 85.4 fL (ref 80.0–100.0)
Platelets: 627 10*3/uL — ABNORMAL HIGH (ref 150–400)
RBC: 2.39 MIL/uL — ABNORMAL LOW (ref 3.87–5.11)
RDW: 24.3 % — ABNORMAL HIGH (ref 11.5–15.5)
WBC: 15.2 10*3/uL — ABNORMAL HIGH (ref 4.0–10.5)
nRBC: 4.5 % — ABNORMAL HIGH (ref 0.0–0.2)

## 2019-03-02 LAB — POCT URINALYSIS DIPSTICK
Bilirubin, UA: NEGATIVE
Blood, UA: NEGATIVE
Glucose, UA: NEGATIVE
Ketones, UA: NEGATIVE
Leukocytes, UA: NEGATIVE
Nitrite, UA: NEGATIVE
Protein, UA: NEGATIVE
Spec Grav, UA: 1.02 (ref 1.010–1.025)
Urobilinogen, UA: 2 E.U./dL — AB
pH, UA: 7 (ref 5.0–8.0)

## 2019-03-02 LAB — PREPARE RBC (CROSSMATCH)

## 2019-03-02 MED ORDER — PRO-STAT 64 PO LIQD: 30.0000 mL | Freq: Three times a day (TID) | ORAL | 1 refills | Status: DC

## 2019-03-02 NOTE — Progress Notes (Signed)
[name], a 48 year old female with a medical history significant for sickle cell disease, type SS, chronic pain syndrome, opiate dependence, anemia of chronic disease, and nonhealing ulcer to right ankle with fat tissue exposed. Discussed nonhealing ulcer with wound specialist who feels that patient is at risk for limb loss due to rapid deterioration of right lower extremity.    Greater control of sickle cell recommended.  Discussed with Dr. Doreene Burke at length, agrees with therapeutic phlebotomy.  Patient's hemoglobin 6.8, which is below baseline.  Will remove 500 mL and follow with 2 units of PRBCs.  Will maintain patient's hemoglobin above 9.0 g/dL.  [name]  APRN, MSN, FNP-C [facility] [address] [place], [place] [address] [phone]   l

## 2019-03-02 NOTE — Progress Notes (Signed)
Established Patient Office Visit  Subjective:  Patient ID: [name], female    DOB: 10/16/1970  Age: 48 y.o. MRN: [id number]  CC:  Chief Complaint  Patient presents with  . Sickle Cell Anemia    HPI [name] is a 48 year old female with a medical history significant for sickle cell disease, type SS, chronic pain syndrome, opiate dependence, opiate tolerance, and long history of leg ulcers to right lower extremity.  Patient is currently under the care of wound management for nonhealing right lower extremity ulcers.  Discussed ulcers at length with Dr. Dema Severin, wound care specialist at [facility] who feels as if skin is deteriorating at a rapid pace and patient is at risk for amputation.  Patient has had leg ulcers in the past, which were healed with greater control of sickle cell disease.  She states that right leg ulcers are very painful.  Today her pain intensity is 7/10.  Patient last had oxycodone around 6 AM without sustained relief.  She characterizes left leg ulcers as draining with a bad odor.  Patient was started on ciprofloxacin on 03/01/2019 by wound care specialist.  Past Medical History:  Diagnosis Date  . Anemia 03/30/2012   Hx of sickle cell disease  . CAP (community acquired pneumonia)    2014  . Cholelithiasis 03/30/2012  . Chronic wound of extremity 04/01/2012  . Elevated LFTs   . Leg ulcer ([place]) 10/27/2012   Chronic under care of wound clinic  . Multiple open wounds of lower extremity    chronic wounds B/LLE  . Sickle cell disease ([place])   . Sinus bradycardia by electrocardiogram 04/03/2012    Past Surgical History:  Procedure Laterality Date  . ALLOGRAFT APPLICATION Right [id number]   Procedure: SURGICAL PREP FOR GRAFTING RIGHT LOWER EXTREMITY AND APPLICATION OF Jannifer Hick;  Surgeon: Irene Limbo, MD;  Location: [facility];  Service: Plastics;  Laterality: Right;  . APPLICATION OF A-CELL OF EXTREMITY Right 10/09/2015   Procedure: APPLICATION OF Jannifer Hick;  Surgeon: Irene Limbo, MD;  Location: [facility];  Service: Plastics;  Laterality: Right;  . BREAST SURGERY     left breast cyst aspiration  . CESAREAN SECTION    . CESAREAN SECTION N/A 01/06/2014   Procedure: CESAREAN SECTION;  Surgeon: Emily Filbert, MD;  Location: [facility];  Service: Obstetrics;  Laterality: N/A;  . CESAREAN SECTION N/A 05/04/2017   Procedure: CESAREAN SECTION;  Surgeon: Woodroe Mode, MD;  Location: [facility];  Service: Obstetrics;  Laterality: N/A;  . I&D EXTREMITY Right 10/09/2015   Procedure: SURGICAL PREPARATION FOR GRAFTING RIGHT ANKLE AND APPLICATION THERASKIN;  Surgeon: Irene Limbo, MD;  Location: [facility];  Service: Plastics;  Laterality: Right;  . SKIN FULL THICKNESS GRAFT Bilateral 06/17/2016   Procedure: SURGICAL PREP FOR GRAFTING, BILATERAL LOWER EXTREMITIES AND APPLICATION OF Jannifer Hick;  Surgeon: Irene Limbo, MD;  Location: [facility];  Service: Plastics;  Laterality: Bilateral;  . TONSILLECTOMY      Family History  Problem Relation Age of Onset  . Sickle cell anemia Sister   . Diabetes Mother   . Asthma Mother   . Sickle cell trait Mother   . Sickle cell trait Father   . Hypertension Father     Social History   Socioeconomic History  . Marital status: Married    Spouse name: [name]  . Number of children: 1  . Years of education: Not on file  . Highest education level:  Not on file  Occupational History  . Occupation: Employed in home care.  Social Needs  . Financial resource strain: Not on file  . Food insecurity    Worry: Not on file    Inability: Not on file  . Transportation needs    Medical: Not on file    Non-medical: Not on file  Tobacco Use  . Smoking status: Never Smoker  . Smokeless tobacco: Never Used  Substance and Sexual Activity  . Alcohol use: No  . Drug use: No  . Sexual activity: Yes    Birth control/protection: None   Lifestyle  . Physical activity    Days per week: Not on file    Minutes per session: Not on file  . Stress: Not on file  Relationships  . Social Herbalist on phone: Not on file    Gets together: Not on file    Attends religious service: Not on file    Active member of club or organization: Not on file    Attends meetings of clubs or organizations: Not on file    Relationship status: Not on file  . Intimate partner violence    Fear of current or ex partner: Not on file    Emotionally abused: Not on file    Physically abused: Not on file    Forced sexual activity: Not on file  Other Topics Concern  . Not on file  Social History Narrative   Lives with husband.    Outpatient Medications Prior to Visit  Medication Sig Dispense Refill  . ciprofloxacin (CIPRO) 500 MG tablet Take 500 mg by mouth 2 (two) times daily.    . folic acid (FOLVITE) 1 MG tablet Take 5 tablets (5 mg total) by mouth daily. 150 tablet 11  . ibuprofen (ADVIL) 600 MG tablet TAKE 1 TABLET(600 MG) BY MOUTH EVERY 6 HOURS 60 tablet 2  . Oxycodone HCl 10 MG TABS Take 1 tablet (10 mg total) by mouth every 4 (four) hours as needed for up to 15 days (pain). 90 tablet 0  . predniSONE (DELTASONE) 5 MG tablet Take 5 mg by mouth 2 (two) times daily with a meal.    . Cholecalciferol (VITAMIN D) 2000 units CAPS Take 2 capsules (4,000 Units total) by mouth daily. (Patient not taking: Reported on 11/18/2018) 30 capsule 11  . fluticasone (FLONASE) 50 MCG/ACT nasal spray Place 2 sprays into both nostrils daily. (Patient not taking: Reported on 02/02/2018) 16 g 6  . L-glutamine (ENDARI) 5 g PACK Powder Packet Take 5 g by mouth 2 (two) times daily. (Patient not taking: Reported on 11/18/2018) 180 each 1  . potassium chloride 20 MEQ TBCR Take 40 mEq by mouth daily for 10 days. 20 tablet 0   No facility-administered medications prior to visit.     No Known Allergies  ROS Review of Systems  Constitutional: Negative for fatigue  and fever.  HENT: Negative.   Eyes: Negative.   Respiratory: Negative.   Cardiovascular: Negative.   Gastrointestinal: Negative.   Endocrine: Negative.   Genitourinary: Negative.   Skin:       Nonhealing right lower extremity leg ulcers with fat tissue exposed  Hematological: Negative.   Psychiatric/Behavioral: Negative.       Objective:    Physical Exam  Constitutional: She is oriented to person, place, and time. She appears well-developed and well-nourished.  HENT:  Head: Normocephalic.  Eyes: Pupils are equal, round, and reactive to light.  Neck: Normal range  of motion.  Cardiovascular: Normal rate and regular rhythm.  Pulmonary/Chest: Effort normal and breath sounds normal.  Abdominal: Soft.  Neurological: She is alert and oriented to person, place, and time.  Skin: Skin is warm.     Dressing to right lower extremity clean dry and intact.  Changed this a.m.  Unable to assess due to dressing  Psychiatric: She has a normal mood and affect. Her behavior is normal. Judgment and thought content normal.    BP 134/70 (BP Location: Left Arm, Patient Position: Sitting, Cuff Size: Normal)   Pulse 77   Temp 98.6 F (37 C) (Oral)   Resp 14   Ht 5\' 7"  (1.702 m)   Wt 136 lb (61.7 kg)   LMP 02/20/2019   SpO2 99%   BMI 21.30 kg/m  Wt Readings from Last 3 Encounters:  03/02/19 136 lb (61.7 kg)  11/18/18 129 lb (58.5 kg)  11/16/18 129 lb (58.5 kg)     There are no preventive care reminders to display for this patient.  There are no preventive care reminders to display for this patient.  Lab Results  Component Value Date   TSH 0.099 (L) 03/20/2017   Lab Results  Component Value Date   WBC 15.2 (H) 03/02/2019   HGB 6.8 (LL) 03/02/2019   HCT 20.4 (L) 03/02/2019   MCV 85.4 03/02/2019   PLT 627 (H) 03/02/2019   Lab Results  Component Value Date   NA 136 11/18/2018   K 3.6 11/18/2018   CHLORIDE 107 01/20/2014   CO2 22 11/18/2018   GLUCOSE 94 11/18/2018   BUN 6  11/18/2018   CREATININE 0.44 (L) 11/18/2018   BILITOT 6.3 (H) 11/16/2018   ALKPHOS 65 11/16/2018   AST 23 11/16/2018   ALT 18 11/16/2018   PROT 7.8 11/16/2018   ALBUMIN 4.5 11/16/2018   CALCIUM 8.8 11/18/2018   ANIONGAP 9 11/16/2018   Lab Results  Component Value Date   CHOL 143 11/25/2014   Lab Results  Component Value Date   HDL 27 (L) 11/25/2014   Lab Results  Component Value Date   LDLCALC 89 11/25/2014   Lab Results  Component Value Date   TRIG 137 11/25/2014   Lab Results  Component Value Date   CHOLHDL 5.3 11/25/2014   Lab Results  Component Value Date   HGBA1C 4.4 (L) 03/20/2017      Assessment & Plan:   Problem List Items Addressed This Visit      Other   Hb-SS disease without crisis ([place]) - Primary   Relevant Orders   Urinalysis Dipstick (Completed)     1. Hb-SS disease without crisis ([place]) Mariyana's sickle cell has been poorly controlled over the past several years.  Patient has developed a worsening ulcer to right lower extremity with fat tissue exposed.  Discussed patient at length with Dr. Dema Severin, wound care specialist who says that patient is at risk for losing a limb due to rapid deterioration of tissue. Goal is to promote healing and the patient.  Discussed with Dr. Doreene Burke, who recommends monthly exchange transfusions.  Also, will maintain patient's hemoglobin above 9. At this juncture, patient warrants referral to hematology.  She may benefit from [place] to help prevent sickle cell crises. Recommend the patient continues folic acid daily for bone marrow support.  Patient transition to sickle cell day infusion center for stat CBC and type and crossmatch.  CBC shows a hemoglobin of 6.8, which is below patient's baseline. Patient will return on 03/03/2019  for an exchange transfusion.  Will remove 500 mL, and transfuse 2 units of PRBCs.  Patient will return to clinic on 03/05/2019 to repeat CBC. We will follow patient closely and maintain hemoglobin  above 9.0 g/dL. Also, patient and I discussed increasing protein intake at length.  Recommend boost or Ensure between meals.  Discussed sources of protein at length. - Urinalysis Dipstick  2. Ulcer of right lower extremity, unspecified ulcer stage ([place]) Continue to follow-up with wound care specialist.  Recommend that patient complete ciprofloxacin as prescribed.   Follow-up: No follow-ups on file.   [name]  APRN, MSN, FNP-C [facility] [address] [place], [place] [address] [phone]

## 2019-03-02 NOTE — Progress Notes (Signed)
Patient's labs drawn for Type and Screen and CBC. Patient to come back tomorrow for blood transfusion. Alert, oriented and ambulatory at discharge.

## 2019-03-02 NOTE — Patient Instructions (Signed)
Complete antibiotic as prescribed by wound care specialist. Have sent pro stat protein supplement to your pharmacy.  3 times per day between meals to promote wound healing.  You will also transition to sickle cell day infusion clinic for CBC and type and screen.  Return in a.m. for an exchange transfusion.  You will also need to have a repeat CBC in 2 days.

## 2019-03-02 NOTE — Progress Notes (Addendum)
CRITICAL VALUE ALERT  Critical Value:  Hemoglobin 6.8  Date & Time Notied:  03/02/19 at 12:10 am  Provider Notified: [name], FNP   Orders Received/Actions taken: Patient scheduled to come for blood transfusion tomorrow.

## 2019-03-03 ENCOUNTER — Ambulatory Visit (HOSPITAL_COMMUNITY)
Admission: RE | Admit: 2019-03-03 | Discharge: 2019-03-03 | Disposition: A | Discharge status: Home / Self Care | Payer: Medicaid Other | Source: Ambulatory Visit | Attending: Internal Medicine | Admitting: Internal Medicine

## 2019-03-03 DIAGNOSIS — D571 Sickle-cell disease without crisis: Secondary | ICD-10-CM | POA: Diagnosis not present

## 2019-03-03 LAB — CBC
HCT: 27 % — ABNORMAL LOW (ref 36.0–46.0)
Hemoglobin: 8.9 g/dL — ABNORMAL LOW (ref 12.0–15.0)
MCH: 28.2 pg (ref 26.0–34.0)
MCHC: 33 g/dL (ref 30.0–36.0)
MCV: 85.4 fL (ref 80.0–100.0)
Platelets: 509 10*3/uL — ABNORMAL HIGH (ref 150–400)
RBC: 3.16 MIL/uL — ABNORMAL LOW (ref 3.87–5.11)
RDW: 20.9 % — ABNORMAL HIGH (ref 11.5–15.5)
WBC: 11.3 10*3/uL — ABNORMAL HIGH (ref 4.0–10.5)
nRBC: 4.4 % — ABNORMAL HIGH (ref 0.0–0.2)

## 2019-03-03 MED ORDER — SODIUM CHLORIDE 0.9% IV SOLUTION
Freq: Once | INTRAVENOUS | Status: AC
  Administered 2019-03-03: 12:00:00 via INTRAVENOUS

## 2019-03-03 MED ORDER — SODIUM CHLORIDE 0.9 % IV SOLN
INTRAVENOUS | Status: DC | PRN
  Administered 2019-03-03: 14:00:00 via INTRAVENOUS

## 2019-03-03 MED ORDER — ACETAMINOPHEN 325 MG PO TABS
650.0000 mg | ORAL_TABLET | Freq: Once | ORAL | Status: AC
  Administered 2019-03-03: 650 mg via ORAL
  Filled 2019-03-03: qty 2

## 2019-03-03 MED ORDER — DIPHENHYDRAMINE HCL 50 MG/ML IJ SOLN
25.0000 mg | Freq: Once | INTRAMUSCULAR | Status: AC
  Administered 2019-03-03: 25 mg via INTRAVENOUS
  Filled 2019-03-03: qty 1

## 2019-03-03 NOTE — Discharge Instructions (Signed)
Blood Transfusion, Adult, Care After This sheet gives you information about how to care for yourself after your procedure. Your doctor may also give you more specific instructions. If you have problems or questions, contact your doctor. Follow these instructions at home:   Take over-the-counter and prescription medicines only as told by your doctor.  Go back to your normal activities as told by your doctor.  Follow instructions from your doctor about how to take care of the area where an IV tube was put into your vein (insertion site). Make sure you: ? Wash your hands with soap and water before you change your bandage (dressing). If there is no soap and water, use hand sanitizer. ? Change your bandage as told by your doctor.  Check your IV insertion site every day for signs of infection. Check for: ? More redness, swelling, or pain. ? More fluid or blood. ? Warmth. ? Pus or a bad smell. Contact a doctor if:  You have more redness, swelling, or pain around the IV insertion site.  You have more fluid or blood coming from the IV insertion site.  Your IV insertion site feels warm to the touch.  You have pus or a bad smell coming from the IV insertion site.  Your pee (urine) turns pink, red, or brown.  You feel weak after doing your normal activities. Get help right away if:  You have signs of a serious allergic or body defense (immune) system reaction, including: ? Itchiness. ? Hives. ? Trouble breathing. ? Anxiety. ? Pain in your chest or lower back. ? Fever, flushing, and chills. ? Fast pulse. ? Rash. ? Watery poop (diarrhea). ? Throwing up (vomiting). ? Dark pee. ? Serious headache. ? Dizziness. ? Stiff neck. ? Yellow color in your face or the white parts of your eyes (jaundice). Summary  After a blood transfusion, return to your normal activities as told by your doctor.  Every day, check for signs of infection where the IV tube was put into your vein.  Some  signs of infection are warm skin, more redness and pain, more fluid or blood, and pus or a bad smell where the needle went in.  Contact your doctor if you feel weak or have any unusual symptoms. This information is not intended to replace advice given to you by your health care provider. Make sure you discuss any questions you have with your health care provider. Document Released: 05/27/2014 Document Revised: 09/10/2017 Document Reviewed: 12/29/2015 Elsevier Patient Education  2020 Elsevier Inc.  

## 2019-03-03 NOTE — Progress Notes (Signed)
Therapeutic phlebotomy, 500 cc of blood  was pulled via a R AC . Patient received  2 units of packed red blood cells via the PIV. Pre transfusion medications - benadryl PO 25 mg capsule and 650 mg Tylenol PO were given. Post transfusion lab draw - CBC was done.  Tolerated well, vitals stable, discharge instructions given, verbalized understanding. Patient alert, oriented and ambulatory at the time of discharge.

## 2019-03-03 NOTE — Progress Notes (Signed)
Started PIV in the R [facility] on patient for therapeutic phlebotomy, pulled out 500 cc per MD order. VS checked and stable. RN at bedside and aware.

## 2019-03-04 LAB — TYPE AND SCREEN
ABO/RH(D): B POS
Antibody Screen: NEGATIVE
Unit division: 0
Unit division: 0

## 2019-03-04 LAB — BPAM RBC
Blood Product Expiration Date: 202011052359
Blood Product Expiration Date: 202011102359
ISSUE DATE / TIME: 202010141115
ISSUE DATE / TIME: 202010141115
Unit Type and Rh: 1700
Unit Type and Rh: 1700

## 2019-03-09 ENCOUNTER — Other Ambulatory Visit: Payer: Self-pay

## 2019-03-09 ENCOUNTER — Other Ambulatory Visit: Payer: Self-pay | Admitting: Family Medicine

## 2019-03-09 ENCOUNTER — Other Ambulatory Visit: Payer: Medicaid Other

## 2019-03-09 ENCOUNTER — Non-Acute Institutional Stay (HOSPITAL_COMMUNITY)
Admission: RE | Admit: 2019-03-09 | Discharge: 2019-03-09 | Disposition: A | Discharge status: Home / Self Care | Payer: Medicaid Other | Source: Ambulatory Visit | Attending: Internal Medicine | Admitting: Internal Medicine

## 2019-03-09 DIAGNOSIS — D571 Sickle-cell disease without crisis: Secondary | ICD-10-CM | POA: Insufficient documentation

## 2019-03-09 LAB — CBC WITH DIFFERENTIAL/PLATELET
Abs Immature Granulocytes: 0.06 10*3/uL (ref 0.00–0.07)
Basophils Absolute: 0.2 10*3/uL — ABNORMAL HIGH (ref 0.0–0.1)
Basophils Relative: 1 %
Eosinophils Absolute: 0.2 10*3/uL (ref 0.0–0.5)
Eosinophils Relative: 1 %
HCT: 26.4 % — ABNORMAL LOW (ref 36.0–46.0)
Hemoglobin: 8.6 g/dL — ABNORMAL LOW (ref 12.0–15.0)
Immature Granulocytes: 1 %
Lymphocytes Relative: 20 %
Lymphs Abs: 2.5 10*3/uL (ref 0.7–4.0)
MCH: 27.6 pg (ref 26.0–34.0)
MCHC: 32.6 g/dL (ref 30.0–36.0)
MCV: 84.6 fL (ref 80.0–100.0)
Monocytes Absolute: 2.1 10*3/uL — ABNORMAL HIGH (ref 0.1–1.0)
Monocytes Relative: 16 %
Neutro Abs: 7.6 10*3/uL (ref 1.7–7.7)
Neutrophils Relative %: 61 %
Platelets: 514 10*3/uL — ABNORMAL HIGH (ref 150–400)
RBC: 3.12 MIL/uL — ABNORMAL LOW (ref 3.87–5.11)
RDW: 21.2 % — ABNORMAL HIGH (ref 11.5–15.5)
WBC: 12.7 10*3/uL — ABNORMAL HIGH (ref 4.0–10.5)
nRBC: 0.2 % (ref 0.0–0.2)

## 2019-03-09 LAB — TYPE AND SCREEN
ABO/RH(D): B POS
Antibody Screen: NEGATIVE

## 2019-03-09 NOTE — Progress Notes (Signed)
Patient's labs drawn (CBC w/diff and Type & Screen). Patient tolerated well. Alert, oriented and ambulatory at discharge.

## 2019-03-15 ENCOUNTER — Telehealth: Payer: Self-pay

## 2019-03-15 NOTE — Telephone Encounter (Signed)
-----   Message from [name], [place] sent at 03/15/2019 10:59 AM EDT ----- Regarding: lab results Please inform patient that hemoglobin is 8.6. Our goal is to maintain hemoglobin above 9 to improve healing of leg ulcers. Also, I would like to start endari in this patient. She will need to schedule a lab appointment for 03/23/2019. We will hold off on doing labs this week. Patient advised to continue to hydrate consistently, increase protein intake, and follow wound care instructions.   [name] Pounds  APRN, MSN, FNP-C Patient [name] [address] [place], [place] [address] [phone]

## 2019-03-15 NOTE — Telephone Encounter (Signed)
Called and spoke with patient, advised that hemoglobin is 8.6 and our goal is to keep it above 9. Advised that we will hold off on labs this week and apt was scheduled for 03/23/2019 for labs only. Advised that she may start endari at that time. Reminded to continue to hydrate, increase protein and follow wound care instructions. Thanks!

## 2019-03-18 ENCOUNTER — Telehealth: Payer: Self-pay | Admitting: Internal Medicine

## 2019-03-18 ENCOUNTER — Other Ambulatory Visit: Payer: Self-pay | Admitting: Family Medicine

## 2019-03-18 MED ORDER — OXYCODONE HCL 10 MG PO TABS: 10.0000 mg | ORAL_TABLET | ORAL | 0 refills | Status: DC | PRN

## 2019-03-18 NOTE — Progress Notes (Signed)
[name], a 48 year old patient with a medical history significant for sickle cell disease, type SS, chronic pain syndrome, opiate dependence, opiate tolerance, and history of chronic lower extremity ulcers requested prescription for oxycodone 10 mg.  Reviewed PDMP prior to prescribing medication, no inconsistencies noted.   Meds ordered this encounter  Medications  . Oxycodone HCl 10 MG TABS    Sig: Take 1 tablet (10 mg total) by mouth every 4 (four) hours as needed.    Dispense:  90 tablet    Refill:  0    Order Specific Question:   Supervising Provider    Answer:   Tresa Garter [id number]    [name]  APRN, MSN, FNP-C Patient [place] [address] [place], [place] [address] [phone]

## 2019-03-18 NOTE — Telephone Encounter (Signed)
Refill request for oxycodone.  

## 2019-03-23 ENCOUNTER — Other Ambulatory Visit: Payer: Self-pay

## 2019-03-23 ENCOUNTER — Ambulatory Visit (HOSPITAL_COMMUNITY)
Admission: RE | Admit: 2019-03-23 | Discharge: 2019-03-23 | Disposition: A | Discharge status: Home / Self Care | Payer: Medicaid Other | Source: Ambulatory Visit | Attending: Internal Medicine | Admitting: Internal Medicine

## 2019-03-23 ENCOUNTER — Other Ambulatory Visit: Payer: Medicaid Other

## 2019-03-23 ENCOUNTER — Other Ambulatory Visit: Payer: Self-pay | Admitting: Family Medicine

## 2019-03-23 DIAGNOSIS — D571 Sickle-cell disease without crisis: Secondary | ICD-10-CM | POA: Insufficient documentation

## 2019-03-23 DIAGNOSIS — D649 Anemia, unspecified: Secondary | ICD-10-CM

## 2019-03-23 LAB — CBC WITH DIFFERENTIAL/PLATELET
Abs Immature Granulocytes: 0.19 10*3/uL — ABNORMAL HIGH (ref 0.00–0.07)
Basophils Absolute: 0.2 10*3/uL — ABNORMAL HIGH (ref 0.0–0.1)
Basophils Relative: 1 %
Eosinophils Absolute: 0.3 10*3/uL (ref 0.0–0.5)
Eosinophils Relative: 2 %
HCT: 23.5 % — ABNORMAL LOW (ref 36.0–46.0)
Hemoglobin: 7.8 g/dL — ABNORMAL LOW (ref 12.0–15.0)
Immature Granulocytes: 1 %
Lymphocytes Relative: 14 %
Lymphs Abs: 2.3 10*3/uL (ref 0.7–4.0)
MCH: 29.2 pg (ref 26.0–34.0)
MCHC: 33.2 g/dL (ref 30.0–36.0)
MCV: 88 fL (ref 80.0–100.0)
Monocytes Absolute: 2.5 10*3/uL — ABNORMAL HIGH (ref 0.1–1.0)
Monocytes Relative: 15 %
Neutro Abs: 11.1 10*3/uL — ABNORMAL HIGH (ref 1.7–7.7)
Neutrophils Relative %: 67 %
Platelets: 500 10*3/uL — ABNORMAL HIGH (ref 150–400)
RBC: 2.67 MIL/uL — ABNORMAL LOW (ref 3.87–5.11)
RDW: 24.3 % — ABNORMAL HIGH (ref 11.5–15.5)
WBC: 16.6 10*3/uL — ABNORMAL HIGH (ref 4.0–10.5)
nRBC: 5.9 % — ABNORMAL HIGH (ref 0.0–0.2)

## 2019-03-23 NOTE — Progress Notes (Signed)
Patient's labs drawn (CBC w/diff and Type & Screen). Patient tolerated well. Blue blood bank band placed on patient's arm. Alert, oriented and ambulatory at discharge.

## 2019-03-24 ENCOUNTER — Telehealth: Payer: Self-pay

## 2019-03-24 NOTE — Telephone Encounter (Signed)
-----   Message from [name], [place] sent at 03/24/2019  4:13 PM EST ----- Regarding: Lab appointment Please call patient to inform her that hemoglobin is 7.8. She will need to be placed on the scheduled for a blood transfusion on tomorrow. She only warrants 1 unit.   [name]  APRN, MSN, FNP-C Patient [place] [address] [place], [place] [address] [phone]

## 2019-03-24 NOTE — Telephone Encounter (Signed)
Patient informed and scheduled for tomorrow 03/25/2019 @8am . Thanks!

## 2019-03-25 ENCOUNTER — Ambulatory Visit (HOSPITAL_COMMUNITY)
Admission: RE | Admit: 2019-03-25 | Discharge: 2019-03-25 | Disposition: A | Discharge status: Home / Self Care | Payer: Medicaid Other | Source: Ambulatory Visit | Attending: Internal Medicine | Admitting: Internal Medicine

## 2019-03-25 ENCOUNTER — Other Ambulatory Visit: Payer: Self-pay | Admitting: Family Medicine

## 2019-03-25 DIAGNOSIS — D571 Sickle-cell disease without crisis: Secondary | ICD-10-CM

## 2019-03-25 LAB — HEMOGLOBIN AND HEMATOCRIT, BLOOD
HCT: 22.4 % — ABNORMAL LOW (ref 36.0–46.0)
HCT: 28 % — ABNORMAL LOW (ref 36.0–46.0)
Hemoglobin: 7.5 g/dL — ABNORMAL LOW (ref 12.0–15.0)
Hemoglobin: 9.4 g/dL — ABNORMAL LOW (ref 12.0–15.0)

## 2019-03-25 LAB — PREPARE RBC (CROSSMATCH)

## 2019-03-25 MED ORDER — ACETAMINOPHEN 325 MG PO TABS
650.0000 mg | ORAL_TABLET | Freq: Once | ORAL | Status: AC
  Administered 2019-03-25: 650 mg via ORAL
  Filled 2019-03-25: qty 2

## 2019-03-25 MED ORDER — SODIUM CHLORIDE 0.9 % IV SOLN
INTRAVENOUS | Status: DC | PRN
  Administered 2019-03-25: 250 mL via INTRAVENOUS

## 2019-03-25 MED ORDER — DIPHENHYDRAMINE HCL 25 MG PO CAPS
25.0000 mg | ORAL_CAPSULE | Freq: Once | ORAL | Status: AC
  Administered 2019-03-25: 25 mg via ORAL
  Filled 2019-03-25: qty 1

## 2019-03-25 NOTE — Discharge Instructions (Signed)
Blood Transfusion, Adult, Care After This sheet gives you information about how to care for yourself after your procedure. Your doctor may also give you more specific instructions. If you have problems or questions, contact your doctor. Follow these instructions at home:   Take over-the-counter and prescription medicines only as told by your doctor.  Go back to your normal activities as told by your doctor.  Follow instructions from your doctor about how to take care of the area where an IV tube was put into your vein (insertion site). Make sure you: ? Wash your hands with soap and water before you change your bandage (dressing). If there is no soap and water, use hand sanitizer. ? Change your bandage as told by your doctor.  Check your IV insertion site every day for signs of infection. Check for: ? More redness, swelling, or pain. ? More fluid or blood. ? Warmth. ? Pus or a bad smell. Contact a doctor if:  You have more redness, swelling, or pain around the IV insertion site.  You have more fluid or blood coming from the IV insertion site.  Your IV insertion site feels warm to the touch.  You have pus or a bad smell coming from the IV insertion site.  Your pee (urine) turns pink, red, or brown.  You feel weak after doing your normal activities. Get help right away if:  You have signs of a serious allergic or body defense (immune) system reaction, including: ? Itchiness. ? Hives. ? Trouble breathing. ? Anxiety. ? Pain in your chest or lower back. ? Fever, flushing, and chills. ? Fast pulse. ? Rash. ? Watery poop (diarrhea). ? Throwing up (vomiting). ? Dark pee. ? Serious headache. ? Dizziness. ? Stiff neck. ? Yellow color in your face or the white parts of your eyes (jaundice). Summary  After a blood transfusion, return to your normal activities as told by your doctor.  Every day, check for signs of infection where the IV tube was put into your vein.  Some  signs of infection are warm skin, more redness and pain, more fluid or blood, and pus or a bad smell where the needle went in.  Contact your doctor if you feel weak or have any unusual symptoms. This information is not intended to replace advice given to you by your health care provider. Make sure you discuss any questions you have with your health care provider. Document Released: 05/27/2014 Document Revised: 09/10/2017 Document Reviewed: 12/29/2015 Elsevier Patient Education  2020 Elsevier Inc.  

## 2019-03-25 NOTE — Progress Notes (Signed)
PATIENT CARE CENTER NOTE  Diagnosis: Sickle Cell Anemia    Provider: Hollis, [place], FNP   Procedure: 1 unit PRBC   Note: Patient received 1 unit of blood. Pre medications given per order. Tolerated well with no adverse reaction. Vital signs wnl. Pre-transfusion Hemoglobin 7.5. Post- transfusion hemoglobin drawn. Discharge instructions given. Patient alert, oriented and ambulatory at discharge.

## 2019-03-25 NOTE — Progress Notes (Signed)
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  It is recommended that patient maintain hemoglobin above 8.0 g/dL to promote healing. Also, patient will receive therapeutic phlebotomy monthly.   [name]  APRN, MSN, FNP-C Patient [place] [address] [place], [place] [address] [phone]

## 2019-03-26 LAB — BPAM RBC
Blood Product Expiration Date: 202012052359
ISSUE DATE / TIME: 202011051008
Unit Type and Rh: 5100

## 2019-03-26 LAB — TYPE AND SCREEN
ABO/RH(D): B POS
Antibody Screen: NEGATIVE
Unit division: 0

## 2019-04-20 ENCOUNTER — Other Ambulatory Visit: Payer: Self-pay | Admitting: Family Medicine

## 2019-04-20 ENCOUNTER — Telehealth: Payer: Self-pay | Admitting: Family Medicine

## 2019-04-20 DIAGNOSIS — G894 Chronic pain syndrome: Secondary | ICD-10-CM

## 2019-04-20 DIAGNOSIS — D571 Sickle-cell disease without crisis: Secondary | ICD-10-CM

## 2019-04-20 MED ORDER — OXYCODONE HCL 10 MG PO TABS: 10.0000 mg | ORAL_TABLET | ORAL | 0 refills | Status: DC | PRN

## 2019-04-20 NOTE — Telephone Encounter (Signed)
Refill request for oxycodone and Ibuprofen did not have directions. Please advise.

## 2019-04-20 NOTE — Progress Notes (Signed)
   Meds ordered this encounter  Medications  . Oxycodone HCl 10 MG TABS    Sig: Take 1 tablet (10 mg total) by mouth every 4 (four) hours as needed.    Dispense:  90 tablet    Refill:  0    Order Specific Question:   Supervising Provider    Answer:   Tresa Garter [id number]    [name]  APRN, MSN, FNP-C Patient [place] [address] [place], [place] [address] [phone]

## 2019-04-23 ENCOUNTER — Other Ambulatory Visit: Payer: Self-pay | Admitting: Family Medicine

## 2019-04-23 ENCOUNTER — Other Ambulatory Visit: Payer: Self-pay

## 2019-04-23 DIAGNOSIS — D571 Sickle-cell disease without crisis: Secondary | ICD-10-CM

## 2019-04-23 MED ORDER — IBUPROFEN 600 MG PO TABS: ORAL_TABLET | ORAL | 2 refills | Status: DC

## 2019-04-23 NOTE — Progress Notes (Signed)
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  It is recommended that patient maintain hemoglobin above 8.0 g/dL to promote healing. Also, patient will receive therapeutic phlebotomy monthly.   [name]  APRN, MSN, FNP-C Patient [place] [address] [place], [place] [address] [phone]

## 2019-04-26 ENCOUNTER — Ambulatory Visit (HOSPITAL_COMMUNITY): Payer: Medicaid Other

## 2019-04-27 ENCOUNTER — Other Ambulatory Visit: Payer: Self-pay

## 2019-04-27 ENCOUNTER — Ambulatory Visit (HOSPITAL_COMMUNITY)
Admission: RE | Admit: 2019-04-27 | Discharge: 2019-04-27 | Disposition: A | Discharge status: Home / Self Care | Payer: Medicaid Other | Source: Ambulatory Visit | Attending: Internal Medicine | Admitting: Internal Medicine

## 2019-04-27 DIAGNOSIS — D571 Sickle-cell disease without crisis: Secondary | ICD-10-CM | POA: Diagnosis present

## 2019-04-27 LAB — CBC
HCT: 23.3 % — ABNORMAL LOW (ref 36.0–46.0)
Hemoglobin: 8 g/dL — ABNORMAL LOW (ref 12.0–15.0)
MCH: 31.4 pg (ref 26.0–34.0)
MCHC: 34.3 g/dL (ref 30.0–36.0)
MCV: 91.4 fL (ref 80.0–100.0)
Platelets: 421 10*3/uL — ABNORMAL HIGH (ref 150–400)
RBC: 2.55 MIL/uL — ABNORMAL LOW (ref 3.87–5.11)
RDW: 21.2 % — ABNORMAL HIGH (ref 11.5–15.5)
WBC: 13.1 10*3/uL — ABNORMAL HIGH (ref 4.0–10.5)
nRBC: 11.8 % — ABNORMAL HIGH (ref 0.0–0.2)

## 2019-04-27 LAB — HEMOGLOBIN AND HEMATOCRIT, BLOOD
HCT: 25.6 % — ABNORMAL LOW (ref 36.0–46.0)
Hemoglobin: 8.6 g/dL — ABNORMAL LOW (ref 12.0–15.0)

## 2019-04-27 LAB — PREPARE RBC (CROSSMATCH)

## 2019-04-27 MED ORDER — ACETAMINOPHEN 325 MG PO TABS
650.0000 mg | ORAL_TABLET | Freq: Once | ORAL | Status: AC
  Administered 2019-04-27: 650 mg via ORAL
  Filled 2019-04-27: qty 2

## 2019-04-27 MED ORDER — SODIUM CHLORIDE 0.9% IV SOLUTION
Freq: Once | INTRAVENOUS | Status: AC
  Administered 2019-04-27: 11:00:00 via INTRAVENOUS

## 2019-04-27 MED ORDER — DIPHENHYDRAMINE HCL 25 MG PO CAPS
25.0000 mg | ORAL_CAPSULE | Freq: Once | ORAL | Status: AC
  Administered 2019-04-27: 25 mg via ORAL
  Filled 2019-04-27: qty 1

## 2019-04-27 NOTE — Progress Notes (Signed)
PATIENT CARE CENTER NOTE  Diagnosis: Sickle Cell Anemia    Provider: Hollis, [place], FNP   Procedure: Therapeutic phlebotomy and 1 unit PRBC   Note: Patient received therapeutic phlebotomy and 500 cc blood drawn off. Immediately afterward, patient received 1 unit PRBC. Pre medications given per order. Tolerated well with no adverse reaction. Vital signs wnl. Pre-transfusion Hemoglobin 8.0. Post- transfusion hemoglobin drawn. Discharge instructions given. Patient alert, oriented and ambulatory at discharge.

## 2019-04-27 NOTE — Discharge Instructions (Signed)
Blood Transfusion, Adult, Care After This sheet gives you information about how to care for yourself after your procedure. Your doctor may also give you more specific instructions. If you have problems or questions, contact your doctor. Follow these instructions at home:   Take over-the-counter and prescription medicines only as told by your doctor.  Go back to your normal activities as told by your doctor.  Follow instructions from your doctor about how to take care of the area where an IV tube was put into your vein (insertion site). Make sure you: ? Wash your hands with soap and water before you change your bandage (dressing). If there is no soap and water, use hand sanitizer. ? Change your bandage as told by your doctor.  Check your IV insertion site every day for signs of infection. Check for: ? More redness, swelling, or pain. ? More fluid or blood. ? Warmth. ? Pus or a bad smell. Contact a doctor if:  You have more redness, swelling, or pain around the IV insertion site.  You have more fluid or blood coming from the IV insertion site.  Your IV insertion site feels warm to the touch.  You have pus or a bad smell coming from the IV insertion site.  Your pee (urine) turns pink, red, or brown.  You feel weak after doing your normal activities. Get help right away if:  You have signs of a serious allergic or body defense (immune) system reaction, including: ? Itchiness. ? Hives. ? Trouble breathing. ? Anxiety. ? Pain in your chest or lower back. ? Fever, flushing, and chills. ? Fast pulse. ? Rash. ? Watery poop (diarrhea). ? Throwing up (vomiting). ? Dark pee. ? Serious headache. ? Dizziness. ? Stiff neck. ? Yellow color in your face or the white parts of your eyes (jaundice). Summary  After a blood transfusion, return to your normal activities as told by your doctor.  Every day, check for signs of infection where the IV tube was put into your vein.  Some  signs of infection are warm skin, more redness and pain, more fluid or blood, and pus or a bad smell where the needle went in.  Contact your doctor if you feel weak or have any unusual symptoms. This information is not intended to replace advice given to you by your health care provider. Make sure you discuss any questions you have with your health care provider. Document Released: 05/27/2014 Document Revised: 09/10/2017 Document Reviewed: 12/29/2015 Elsevier Patient Education  2020 Elsevier Inc.  

## 2019-04-28 LAB — BPAM RBC
Blood Product Expiration Date: 202101042359
ISSUE DATE / TIME: 202012081035
Unit Type and Rh: 5100

## 2019-04-28 LAB — TYPE AND SCREEN
ABO/RH(D): B POS
Antibody Screen: NEGATIVE
Unit division: 0

## 2019-05-04 ENCOUNTER — Encounter (HOSPITAL_COMMUNITY): Payer: Medicaid Other

## 2019-05-07 ENCOUNTER — Other Ambulatory Visit: Payer: Self-pay | Admitting: Family Medicine

## 2019-05-07 ENCOUNTER — Ambulatory Visit (HOSPITAL_COMMUNITY)
Admission: RE | Admit: 2019-05-07 | Discharge: 2019-05-07 | Disposition: A | Discharge status: Home / Self Care | Payer: Medicaid Other | Source: Ambulatory Visit | Attending: Internal Medicine | Admitting: Internal Medicine

## 2019-05-07 ENCOUNTER — Other Ambulatory Visit: Payer: Self-pay

## 2019-05-07 DIAGNOSIS — D571 Sickle-cell disease without crisis: Secondary | ICD-10-CM

## 2019-05-07 LAB — HEMOGLOBIN AND HEMATOCRIT, BLOOD
HCT: 27.4 % — ABNORMAL LOW (ref 36.0–46.0)
Hemoglobin: 9.3 g/dL — ABNORMAL LOW (ref 12.0–15.0)

## 2019-05-07 LAB — CBC
HCT: 26.2 % — ABNORMAL LOW (ref 36.0–46.0)
Hemoglobin: 8.6 g/dL — ABNORMAL LOW (ref 12.0–15.0)
MCH: 29 pg (ref 26.0–34.0)
MCHC: 32.8 g/dL (ref 30.0–36.0)
MCV: 88.2 fL (ref 80.0–100.0)
Platelets: 505 10*3/uL — ABNORMAL HIGH (ref 150–400)
RBC: 2.97 MIL/uL — ABNORMAL LOW (ref 3.87–5.11)
RDW: 19.3 % — ABNORMAL HIGH (ref 11.5–15.5)
WBC: 11.2 10*3/uL — ABNORMAL HIGH (ref 4.0–10.5)
nRBC: 4.3 % — ABNORMAL HIGH (ref 0.0–0.2)

## 2019-05-07 LAB — PREPARE RBC (CROSSMATCH)

## 2019-05-07 MED ORDER — SODIUM CHLORIDE 0.9% IV SOLUTION: Freq: Once | INTRAVENOUS | Status: AC

## 2019-05-07 NOTE — Progress Notes (Signed)
Per order, CBC and type and screen labs were drawn. Hemoglobin was 8.6. Patient was transfused with 1 unit of packed red blood cells.Tolerated well, vitals stable, discharge instructions given, verbalized understanding. Post transfusion Hemoglobin and Hematocrit was drawn. Patient alert, oriented and ambulatory at the time of discharge.

## 2019-05-07 NOTE — Discharge Instructions (Signed)
Blood Transfusion, Adult, Care After This sheet gives you information about how to care for yourself after your procedure. Your doctor may also give you more specific instructions. If you have problems or questions, contact your doctor. Follow these instructions at home:   Take over-the-counter and prescription medicines only as told by your doctor.  Go back to your normal activities as told by your doctor.  Follow instructions from your doctor about how to take care of the area where an IV tube was put into your vein (insertion site). Make sure you: ? Wash your hands with soap and water before you change your bandage (dressing). If there is no soap and water, use hand sanitizer. ? Change your bandage as told by your doctor.  Check your IV insertion site every day for signs of infection. Check for: ? More redness, swelling, or pain. ? More fluid or blood. ? Warmth. ? Pus or a bad smell. Contact a doctor if:  You have more redness, swelling, or pain around the IV insertion site.  You have more fluid or blood coming from the IV insertion site.  Your IV insertion site feels warm to the touch.  You have pus or a bad smell coming from the IV insertion site.  Your pee (urine) turns pink, red, or brown.  You feel weak after doing your normal activities. Get help right away if:  You have signs of a serious allergic or body defense (immune) system reaction, including: ? Itchiness. ? Hives. ? Trouble breathing. ? Anxiety. ? Pain in your chest or lower back. ? Fever, flushing, and chills. ? Fast pulse. ? Rash. ? Watery poop (diarrhea). ? Throwing up (vomiting). ? Dark pee. ? Serious headache. ? Dizziness. ? Stiff neck. ? Yellow color in your face or the white parts of your eyes (jaundice). Summary  After a blood transfusion, return to your normal activities as told by your doctor.  Every day, check for signs of infection where the IV tube was put into your vein.  Some  signs of infection are warm skin, more redness and pain, more fluid or blood, and pus or a bad smell where the needle went in.  Contact your doctor if you feel weak or have any unusual symptoms. This information is not intended to replace advice given to you by your health care provider. Make sure you discuss any questions you have with your health care provider. Document Released: 05/27/2014 Document Revised: 09/10/2017 Document Reviewed: 12/29/2015 Elsevier Patient Education  2020 Elsevier Inc.  

## 2019-05-08 ENCOUNTER — Other Ambulatory Visit: Payer: Self-pay | Admitting: Family Medicine

## 2019-05-09 LAB — TYPE AND SCREEN
ABO/RH(D): B POS
Antibody Screen: NEGATIVE
Unit division: 0

## 2019-05-09 LAB — BPAM RBC
Blood Product Expiration Date: 202101012359
ISSUE DATE / TIME: 202012181226
Unit Type and Rh: 1700

## 2019-05-20 ENCOUNTER — Other Ambulatory Visit: Payer: Self-pay | Admitting: Family Medicine

## 2019-05-20 ENCOUNTER — Telehealth: Payer: Self-pay

## 2019-05-20 DIAGNOSIS — G894 Chronic pain syndrome: Secondary | ICD-10-CM

## 2019-05-20 MED ORDER — OXYCODONE HCL 10 MG PO TABS: 10.0000 mg | ORAL_TABLET | ORAL | 0 refills | Status: DC | PRN

## 2019-05-20 NOTE — Telephone Encounter (Signed)
Voicemail left 05/19/19 @ approximately 12pm for refill on oxycodone 10mg . Please advise.

## 2019-05-20 NOTE — Progress Notes (Signed)
Meds ordered this encounter  Medications   Oxycodone HCl 10 MG TABS    Sig: Take 1 tablet (10 mg total) by mouth every 4 (four) hours as needed.    Dispense:  90 tablet    Refill:  0    Order Specific Question:   Supervising Provider    Answer:   JEGEDE, OLUGBEMIGA E [[id number]]  Reviewed PDMP substance reporting system prior to prescribing opiate medications. No inconsistencies noted.  Tarik Teixeira Moore Kaleth Koy  APRN, MSN, FNP-C Patient [facility] [address]  [place], [place] [address] [phone]  

## 2019-05-28 ENCOUNTER — Telehealth: Payer: Self-pay

## 2019-05-28 ENCOUNTER — Other Ambulatory Visit: Payer: Self-pay | Admitting: Family Medicine

## 2019-05-28 DIAGNOSIS — D571 Sickle-cell disease without crisis: Secondary | ICD-10-CM

## 2019-05-28 NOTE — Telephone Encounter (Signed)
Patient called and is asking when she should have next blood work checked for possible transfussion? Please advise.

## 2019-05-28 NOTE — Progress Notes (Signed)
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  It is recommended that patient maintain hemoglobin above 8.0 g/dL to promote healing. Also, patient will receive therapeutic phlebotomy monthly. Patient scheduled for transfusion on 06/01/2019.    [name]  APRN, MSN, FNP-C Patient [place] [address] [place], [place] [address] [phone]

## 2019-05-28 NOTE — Telephone Encounter (Signed)
Called, no answer. Left a message for you to call back.

## 2019-06-01 ENCOUNTER — Other Ambulatory Visit: Payer: Self-pay

## 2019-06-01 ENCOUNTER — Ambulatory Visit (HOSPITAL_COMMUNITY)
Admission: RE | Admit: 2019-06-01 | Discharge: 2019-06-01 | Disposition: A | Discharge status: Home / Self Care | Payer: Medicaid Other | Source: Ambulatory Visit | Attending: Internal Medicine | Admitting: Internal Medicine

## 2019-06-01 DIAGNOSIS — D571 Sickle-cell disease without crisis: Secondary | ICD-10-CM | POA: Diagnosis not present

## 2019-06-01 LAB — CBC
HCT: 28.1 % — ABNORMAL LOW (ref 36.0–46.0)
HCT: 30.5 % — ABNORMAL LOW (ref 36.0–46.0)
Hemoglobin: 9.1 g/dL — ABNORMAL LOW (ref 12.0–15.0)
Hemoglobin: 9.9 g/dL — ABNORMAL LOW (ref 12.0–15.0)
MCH: 29.9 pg (ref 26.0–34.0)
MCH: 29.4 pg (ref 26.0–34.0)
MCHC: 32.4 g/dL (ref 30.0–36.0)
MCHC: 32.5 g/dL (ref 30.0–36.0)
MCV: 92.4 fL (ref 80.0–100.0)
MCV: 90.5 fL (ref 80.0–100.0)
Platelets: 461 10*3/uL — ABNORMAL HIGH (ref 150–400)
Platelets: 354 10*3/uL (ref 150–400)
RBC: 3.04 MIL/uL — ABNORMAL LOW (ref 3.87–5.11)
RBC: 3.37 MIL/uL — ABNORMAL LOW (ref 3.87–5.11)
RDW: 20.1 % — ABNORMAL HIGH (ref 11.5–15.5)
RDW: 18.8 % — ABNORMAL HIGH (ref 11.5–15.5)
WBC: 21.5 10*3/uL — ABNORMAL HIGH (ref 4.0–10.5)
WBC: 19.4 10*3/uL — ABNORMAL HIGH (ref 4.0–10.5)
nRBC: 5.3 % — ABNORMAL HIGH (ref 0.0–0.2)
nRBC: 5.3 % — ABNORMAL HIGH (ref 0.0–0.2)

## 2019-06-01 LAB — PREPARE RBC (CROSSMATCH)

## 2019-06-01 MED ORDER — SODIUM CHLORIDE 0.9% IV SOLUTION: Freq: Once | INTRAVENOUS | Status: AC

## 2019-06-01 MED ORDER — ACETAMINOPHEN 325 MG PO TABS
650.0000 mg | ORAL_TABLET | Freq: Once | ORAL | Status: AC
  Administered 2019-06-01: 650 mg via ORAL
  Filled 2019-06-01: qty 2

## 2019-06-01 MED ORDER — DIPHENHYDRAMINE HCL 25 MG PO CAPS
25.0000 mg | ORAL_CAPSULE | Freq: Once | ORAL | Status: AC
  Administered 2019-06-01: 25 mg via ORAL
  Filled 2019-06-01: qty 1

## 2019-06-01 NOTE — Discharge Instructions (Signed)

## 2019-06-01 NOTE — Progress Notes (Signed)
PATIENT CARE CENTER NOTE  Diagnosis: Sickle Cell Anemia    Provider: Hollis, [place], FNP   Procedure: 1 unit PRBC   Note: Patient received 1 unit of blood. Pre medications given per order. Tolerated well with no adverse reaction. Vital signs wnl. Pre-transfusion Hemoglobin 9.1. Post- transfusion hemoglobin drawn. Discharge instructions given. Patient alert, oriented and ambulatory at discharge.

## 2019-06-02 LAB — BPAM RBC
Blood Product Expiration Date: 202101232359
ISSUE DATE / TIME: 202101121132
Unit Type and Rh: 9500

## 2019-06-02 LAB — TYPE AND SCREEN
ABO/RH(D): B POS
Antibody Screen: NEGATIVE
Unit division: 0

## 2019-06-03 ENCOUNTER — Other Ambulatory Visit: Payer: Self-pay | Admitting: Family Medicine

## 2019-06-03 DIAGNOSIS — D571 Sickle-cell disease without crisis: Secondary | ICD-10-CM

## 2019-06-08 ENCOUNTER — Other Ambulatory Visit: Payer: Self-pay

## 2019-06-08 ENCOUNTER — Ambulatory Visit (INDEPENDENT_AMBULATORY_CARE_PROVIDER_SITE_OTHER): Payer: Medicaid Other | Admitting: Family Medicine

## 2019-06-08 ENCOUNTER — Encounter: Payer: Self-pay | Admitting: Family Medicine

## 2019-06-08 ENCOUNTER — Encounter (HOSPITAL_COMMUNITY): Payer: Medicaid Other

## 2019-06-08 VITALS — BP 128/83 | HR 64 | Temp 98.7°F | Resp 14 | Ht 67.0 in | Wt 148.0 lb

## 2019-06-08 DIAGNOSIS — D571 Sickle-cell disease without crisis: Secondary | ICD-10-CM | POA: Diagnosis not present

## 2019-06-08 DIAGNOSIS — G894 Chronic pain syndrome: Secondary | ICD-10-CM

## 2019-06-08 DIAGNOSIS — L97312 Non-pressure chronic ulcer of right ankle with fat layer exposed: Secondary | ICD-10-CM

## 2019-06-08 LAB — POCT URINALYSIS DIPSTICK
Bilirubin, UA: NEGATIVE
Blood, UA: NEGATIVE
Glucose, UA: NEGATIVE
Ketones, UA: NEGATIVE
Leukocytes, UA: NEGATIVE
Nitrite, UA: NEGATIVE
Protein, UA: NEGATIVE
Spec Grav, UA: 1.015 (ref 1.010–1.025)
Urobilinogen, UA: 1 E.U./dL
pH, UA: 7 (ref 5.0–8.0)

## 2019-06-08 NOTE — Progress Notes (Signed)
Patient [name]  Established Patient Office Visit  Subjective:  Patient ID: [name], female    DOB: 1971-03-11  Age: 49 y.o. MRN: [id number]  CC:  Chief Complaint  Patient presents with  . 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 Patient says that she continues to have significant pain to right lower extremity. She has a long history of leg ulcers. She has been under the care of wound management over the past several years. Over the past months right lower extremity ulcer has been worsening. I was recommended that patient's hemoglobin be maintained above 9.0 g/dL to improve leg ulcers. Patient has been receiving monthly red blood cell exchanges over the past several months. She has been tolerating well. She says that pain has been controlled on Oxycodone, she last had this am with sustained relief. She is not having pain at this time. She denies headache, chest pain, shortness of breath, urinary symptoms, nausea, vomiting, or diarrhea. No fever, chills, sick contacts, or exposure to COVID 19.   Past Medical History:  Diagnosis Date  . Anemia 03/30/2012   Hx of sickle cell disease  . CAP (community acquired pneumonia)    2014  . Cholelithiasis 03/30/2012  . Chronic wound of extremity 04/01/2012  . Elevated LFTs   . Leg ulcer ([place]) 10/27/2012   Chronic under care of wound clinic  . Multiple open wounds of lower extremity    chronic wounds B/LLE  . Sickle cell disease ([place])   . Sinus bradycardia by electrocardiogram 04/03/2012    Past Surgical History:  Procedure Laterality Date  . ALLOGRAFT APPLICATION Right [id number]   Procedure: SURGICAL PREP FOR GRAFTING RIGHT LOWER  EXTREMITY AND APPLICATION OF Jannifer Hick;  Surgeon: Irene Limbo, MD;  Location: [facility];  Service: Plastics;  Laterality: Right;  . APPLICATION OF A-CELL OF EXTREMITY Right 10/09/2015   Procedure: APPLICATION OF Jannifer Hick;  Surgeon: Irene Limbo, MD;  Location: [facility];  Service: Plastics;  Laterality: Right;  . BREAST SURGERY     left breast cyst aspiration  . CESAREAN SECTION    . CESAREAN SECTION N/A 01/06/2014   Procedure: CESAREAN SECTION;  Surgeon: Emily Filbert, MD;  Location: [facility];  Service: Obstetrics;  Laterality: N/A;  . CESAREAN SECTION N/A 05/04/2017   Procedure: CESAREAN SECTION;  Surgeon: Woodroe Mode, MD;  Location: [facility];  Service: Obstetrics;  Laterality: N/A;  . I & D EXTREMITY Right 10/09/2015   Procedure: SURGICAL PREPARATION FOR GRAFTING RIGHT ANKLE AND APPLICATION THERASKIN;  Surgeon: Irene Limbo, MD;  Location: [facility];  Service: Plastics;  Laterality: Right;  . SKIN FULL THICKNESS GRAFT Bilateral 06/17/2016   Procedure: SURGICAL PREP FOR GRAFTING, BILATERAL LOWER EXTREMITIES AND APPLICATION OF Jannifer Hick;  Surgeon: Irene Limbo, MD;  Location: [facility];  Service: Plastics;  Laterality: Bilateral;  . TONSILLECTOMY      Family History  Problem Relation Age of Onset  . Sickle cell anemia Sister   . Diabetes Mother   . Asthma Mother   . Sickle cell trait Mother   . Sickle cell trait Father   . Hypertension Father     Social History   Socioeconomic History  .  Marital status: Married    Spouse name: [name]  . Number of children: 1  . Years of education: Not on file  . Highest education level: Not on file  Occupational History  . Occupation: Employed in home care.  Tobacco Use  . Smoking status: Never Smoker  . Smokeless tobacco: Never Used  Substance and Sexual Activity  . Alcohol use: No  . Drug use: No  . Sexual activity: Yes    Birth control/protection: None   Other Topics Concern  . Not on file  Social History Narrative   Lives with husband.   Social Determinants of Health   Financial Resource Strain:   . Difficulty of Paying Living Expenses: Not on file  Food Insecurity:   . Worried About Charity fundraiser in the Last Year: Not on file  . Ran Out of Food in the Last Year: Not on file  Transportation Needs:   . Lack of Transportation (Medical): Not on file  . Lack of Transportation (Non-Medical): Not on file  Physical Activity:   . Days of Exercise per Week: Not on file  . Minutes of Exercise per Session: Not on file  Stress:   . Feeling of Stress : Not on file  Social Connections:   . Frequency of Communication with Friends and Family: Not on file  . Frequency of Social Gatherings with Friends and Family: Not on file  . Attends Religious Services: Not on file  . Active Member of Clubs or Organizations: Not on file  . Attends Archivist Meetings: Not on file  . Marital Status: Not on file  Intimate Partner Violence:   . Fear of Current or Ex-Partner: Not on file  . Emotionally Abused: Not on file  . Physically Abused: Not on file  . Sexually Abused: Not on file    Outpatient Medications Prior to Visit  Medication Sig Dispense Refill  . Amino Acids-Protein Hydrolys (FEEDING SUPPLEMENT, PRO-STAT 64,) LIQD Take 30 mLs by mouth 3 (three) times daily with meals. [id number] mL 1  . folic acid (FOLVITE) 1 MG tablet Take 5 tablets (5 mg total) by mouth daily. 150 tablet 11  . ibuprofen (ADVIL) 600 MG tablet TAKE 1 TABLET(600 MG) BY MOUTH EVERY 6 HOURS 60 tablet 2  . Oxycodone HCl 10 MG TABS Take 1 tablet (10 mg total) by mouth every 4 (four) hours as needed. 90 tablet 0  . predniSONE (DELTASONE) 5 MG tablet Take 5 mg by mouth 2 (two) times daily with a meal.    . ciprofloxacin (CIPRO) 500 MG tablet Take 500 mg by mouth 2 (two) times daily.     No facility-administered medications prior to visit.    No Known Allergies  ROS  Review of Systems    Objective:    Physical Exam  BP 128/83 (BP Location: Right Arm, Patient Position: Sitting, Cuff Size: Normal)   Pulse 64   Temp 98.7 F (37.1 C) (Oral)   Resp 14   Ht 5\' 7"  (1.702 m)   Wt 148 lb (67.1 kg)   LMP 05/10/2019   SpO2 100%   BMI 23.18 kg/m  Wt Readings from Last 3 Encounters:  06/08/19 148 lb (67.1 kg)  03/02/19 136 lb (61.7 kg)  11/18/18 129 lb (58.5 kg)     There are no preventive care reminders to display for this patient.  There are no preventive care reminders to display for this patient.  Lab Results  Component Value Date  TSH 0.099 (L) 03/20/2017   Lab Results  Component Value Date   WBC 19.4 (H) 06/01/2019   HGB 9.9 (L) 06/01/2019   HCT 30.5 (L) 06/01/2019   MCV 90.5 06/01/2019   PLT 354 06/01/2019   Lab Results  Component Value Date   NA 136 11/18/2018   K 3.6 11/18/2018   CHLORIDE 107 01/20/2014   CO2 22 11/18/2018   GLUCOSE 94 11/18/2018   BUN 6 11/18/2018   CREATININE 0.44 (L) 11/18/2018   BILITOT 6.3 (H) 11/16/2018   ALKPHOS 65 11/16/2018   AST 23 11/16/2018   ALT 18 11/16/2018   PROT 7.8 11/16/2018   ALBUMIN 4.5 11/16/2018   CALCIUM 8.8 11/18/2018   ANIONGAP 9 11/16/2018   Lab Results  Component Value Date   CHOL 143 11/25/2014   Lab Results  Component Value Date   HDL 27 (L) 11/25/2014   Lab Results  Component Value Date   LDLCALC 89 11/25/2014   Lab Results  Component Value Date   TRIG 137 11/25/2014   Lab Results  Component Value Date   CHOLHDL 5.3 11/25/2014   Lab Results  Component Value Date   HGBA1C 4.4 (L) 03/20/2017      Assessment & Plan:   Problem List Items Addressed This Visit      Other   Hb-SS disease without crisis ([place]) - Primary   Relevant Orders   Urinalysis Dipstick (Completed)   [id number] 11+Oxyco+Alc+Crt-Bund (Completed)   Chronic pain syndrome (Chronic)   Anemia of chronic disease      Hb-SS disease without crisis ([place]) Continue folic acid 1 mg daily  to prevent aplastic bone marrow crises.   Pulmonary evaluation - Patient denies severe recurrent wheezes, shortness of breath with exercise, or persistent cough. If these symptoms develop, pulmonary function tests with spirometry will be ordered, and if abnormal, plan on referral to Pulmonology for further evaluation.  Cardiac - Routine screening for pulmonary hypertension is not recommended.   Eye - High risk of proliferative retinopathy. Annual eye exam with retinal exam recommended to patient.   Immunization status - Patient up to date with vaccinations. Discussed COVID 19 vaccinations at length.   Acute and chronic painful episodes - No changes to opiate medication regimen warranted on today.   - Urinalysis Dipstick - [id number] 11+Oxyco+Alc+Crt-Bund   - Urinalysis Dipstick - [id number] 11+Oxyco+Alc+Crt-Bund  Chronic pain syndrome - [id number] 11+Oxyco+Alc+Crt-Bund - Opiates Confirmation, Urine - Oxycodone/Oxymorphone, Confirm    Non-healing ulcer of ankle with fat layer exposed, right ([place]) Continue to follow up with wound care weekly. Also, will continue to follow up for monthly exchange transfusion at [facility] care center. Will also maintain hemoglobin above 9.0 g/dL  Follow-up: Return in about 1 month (around 07/09/2019) for sickle cell anemia.    [name]  APRN, MSN, FNP-C Patient [place] [address] [place], [place] [address] [phone]

## 2019-06-08 NOTE — Patient Instructions (Signed)
Sickle Cell Anemia, Adult  Sickle cell anemia is a condition where your red blood cells are shaped like sickles. Red blood cells carry oxygen through the body. Sickle-shaped cells do not live as long as normal red blood cells. They also clump together and block blood from flowing through the blood vessels. This prevents the body from getting enough oxygen. Sickle cell anemia causes organ damage and pain. It also increases the risk of infection. Follow these instructions at home: Medicines  Take over-the-counter and prescription medicines only as told by your doctor.  If you were prescribed an antibiotic medicine, take it as told by your doctor. Do not stop taking the antibiotic even if you start to feel better.  If you develop a fever, do not take medicines to lower the fever right away. Tell your doctor about the fever. Managing pain, stiffness, and swelling  Try these methods to help with pain: ? Use a heating pad. ? Take a warm bath. ? Distract yourself, such as by watching TV. Eating and drinking  Drink enough fluid to keep your pee (urine) clear or pale yellow. Drink more in hot weather and during exercise.  Limit or avoid alcohol.  Eat a healthy diet. Eat plenty of fruits, vegetables, whole grains, and lean protein.  Take vitamins and supplements as told by your doctor. Traveling  When traveling, keep these with you: ? Your medical information. ? The names of your doctors. ? Your medicines.  If you need to take an airplane, talk to your doctor first. Activity  Rest often.  Avoid exercises that make your heart beat much faster, such as jogging. General instructions  Do not use products that have nicotine or tobacco, such as cigarettes and e-cigarettes. If you need help quitting, ask your doctor.  Consider wearing a medical alert bracelet.  Avoid being in high places (high altitudes), such as mountains.  Avoid very hot or cold temperatures.  Avoid places where the  temperature changes a lot.  Keep all follow-up visits as told by your doctor. This is important. Contact a doctor if:  A joint hurts.  Your feet or hands hurt or swell.  You feel tired (fatigued). Get help right away if:  You have symptoms of infection. These include: ? Fever. ? Chills. ? Being very tired. ? Irritability. ? Poor eating. ? Throwing up (vomiting).  You feel dizzy or faint.  You have new stomach pain, especially on the left side.  You have a an erection (priapism) that lasts more than 4 hours.  You have numbness in your arms or legs.  You have a hard time moving your arms or legs.  You have trouble talking.  You have pain that does not go away when you take medicine.  You are short of breath.  You are breathing fast.  You have a long-term cough.  You have pain in your chest.  You have a bad headache.  You have a stiff neck.  Your stomach looks bloated even though you did not eat much.  Your skin is pale.  You suddenly cannot see well. Summary  Sickle cell anemia is a condition where your red blood cells are shaped like sickles.  Follow your doctor's advice on ways to manage pain, food to eat, activities to do, and steps to take for safe travel.  Get medical help right away if you have any signs of infection, such as a fever. This information is not intended to replace advice given to you by   your health care provider. Make sure you discuss any questions you have with your health care provider. Document Revised: 08/28/2018 Document Reviewed: 06/11/2016 Elsevier Patient Education  2020 Elsevier Inc.  

## 2019-06-12 LAB — OXYCODONE/OXYMORPHONE, CONFIRM
OXYCODONE/OXYMORPH: POSITIVE — AB
OXYCODONE: 1291 ng/mL
OXYCODONE: POSITIVE — AB
OXYMORPHONE (GC/MS): 2412 ng/mL
OXYMORPHONE: POSITIVE — AB

## 2019-06-12 LAB — DRUG SCREEN 764883 11+OXYCO+ALC+CRT-BUND
Amphetamines, Urine: NEGATIVE ng/mL
BENZODIAZ UR QL: NEGATIVE ng/mL
Barbiturate: NEGATIVE ng/mL
Cannabinoid Quant, Ur: NEGATIVE ng/mL
Cocaine (Metabolite): NEGATIVE ng/mL
Creatinine: 49.3 mg/dL (ref 20.0–300.0)
Ethanol: NEGATIVE %
Meperidine: NEGATIVE ng/mL
Methadone Screen, Urine: NEGATIVE ng/mL
Phencyclidine: NEGATIVE ng/mL
Propoxyphene: NEGATIVE ng/mL
Tramadol: NEGATIVE ng/mL
pH, Urine: 6.6 (ref 4.5–8.9)

## 2019-06-12 LAB — OPIATES CONFIRMATION, URINE: Opiates: NEGATIVE ng/mL

## 2019-06-15 ENCOUNTER — Ambulatory Visit (HOSPITAL_COMMUNITY)
Admission: RE | Admit: 2019-06-15 | Discharge: 2019-06-15 | Disposition: A | Discharge status: Home / Self Care | Payer: Medicaid Other | Source: Ambulatory Visit | Attending: Internal Medicine | Admitting: Internal Medicine

## 2019-06-15 ENCOUNTER — Other Ambulatory Visit: Payer: Self-pay | Admitting: Family Medicine

## 2019-06-15 ENCOUNTER — Other Ambulatory Visit: Payer: Self-pay

## 2019-06-15 DIAGNOSIS — D571 Sickle-cell disease without crisis: Secondary | ICD-10-CM | POA: Diagnosis not present

## 2019-06-15 LAB — CBC
HCT: 26.6 % — ABNORMAL LOW (ref 36.0–46.0)
Hemoglobin: 8.8 g/dL — ABNORMAL LOW (ref 12.0–15.0)
MCH: 30.1 pg (ref 26.0–34.0)
MCHC: 33.1 g/dL (ref 30.0–36.0)
MCV: 91.1 fL (ref 80.0–100.0)
Platelets: 449 10*3/uL — ABNORMAL HIGH (ref 150–400)
RBC: 2.92 MIL/uL — ABNORMAL LOW (ref 3.87–5.11)
RDW: 19.5 % — ABNORMAL HIGH (ref 11.5–15.5)
WBC: 17.4 10*3/uL — ABNORMAL HIGH (ref 4.0–10.5)
nRBC: 3.4 % — ABNORMAL HIGH (ref 0.0–0.2)

## 2019-06-15 LAB — HEMOGLOBIN AND HEMATOCRIT, BLOOD
HCT: 27.8 % — ABNORMAL LOW (ref 36.0–46.0)
Hemoglobin: 9.4 g/dL — ABNORMAL LOW (ref 12.0–15.0)

## 2019-06-15 LAB — PREPARE RBC (CROSSMATCH)

## 2019-06-15 MED ORDER — SODIUM CHLORIDE 0.9% IV SOLUTION: Freq: Once | INTRAVENOUS | Status: AC

## 2019-06-15 NOTE — Discharge Instructions (Signed)
Sickle Cell Anemia, Adult ° °Sickle cell anemia is a condition in which red blood cells have an abnormal “sickle” shape. Red blood cells carry oxygen through the body. Sickle-shaped red blood cells do not live as long as normal red blood cells. They also clump together and block blood from flowing through the blood vessels. This condition prevents the body from getting enough oxygen. Sickle cell anemia causes organ damage and pain. It also increases the risk of infection. °What are the causes? °This condition is caused by a gene that is passed from parent to child (inherited). Receiving two copies of the gene causes the disease. Receiving one copy causes the "trait," which means that symptoms are milder or not present. °What increases the risk? °This condition is more likely to develop if your ancestors were from Africa, the Mediterranean, South or Central [place], the [place], [place], or the Middle East. °What are the signs or symptoms? °Symptoms of this condition include: °· Episodes of pain (crises), especially in the hands and feet, joints, back, chest, or abdomen. The pain can be triggered by: °? An illness, especially if there is dehydration. °? Doing an activity with great effort (overexertion). °? Exposure to extreme temperature changes. °? High altitude. °· Fatigue. °· Shortness of breath or difficulty breathing. °· Dizziness. °· Pale skin or yellowed skin (jaundice). °· Frequent bacterial infections. °· Pain and swelling in the hands and feet (hand-food syndrome). °· Prolonged, painful erection of the penis (priapism). °· Acute chest syndrome. Symptoms of this include: °? Chest pain. °? Fever. °? Cough. °? Fast breathing. °· Stroke. °· Decreased activity. °· Loss of appetite. °· Change in behavior. °· Headaches. °· Seizures. °· Vision changes. °· Skin ulcers. °· Heart disease. °· High blood pressure. °· Gallstones. °· Liver and kidney problems. °How is this diagnosed? °This condition is diagnosed with  blood tests that check for the gene that causes this condition. °How is this treated? °There is no cure for most cases of this condition. Treatment focuses on managing your symptoms and preventing complications of the disease. Your health care provider will work with you to identify the best treatment options for you based on an assessment of your condition. Treatment may include: °· Medicines, including: °? Pain medicines. °? Antibiotic medicines for infection. °? Medicines to increase the production of a protein in red blood cells that helps carry oxygen in the body (hemoglobin). °· Fluids to treat pain and swelling. °· Oxygen to treat acute chest syndrome. °· Blood transfusions to treat symptoms such as fatigue, stroke, and acute chest syndrome. °· Massage and physical therapy for pain. °· Regular tests to monitor your condition, such as blood tests, X-rays, CT scans, MRI scans, ultrasounds, and lung function tests. These should be done every 3-12 months, depending on your age. °· Hematopoietic stem cell transplant. This is a procedure to replace abnormal stem cells with healthy stem cells from a donor's bone marrow. Stem cells are cells that can develop into blood cells, and bone marrow is the spongy tissue inside the bones. °Follow these instructions at home: °Medicines °· Take over-the-counter and prescription medicines only as told by your health care provider. °· If you were prescribed an antibiotic medicine, take it as told by your health care provider. Do not stop taking the antibiotic even if you start to feel better. °· If you develop a fever, do not take medicines to reduce the fever right away. This could cover up another problem. Notify your health care provider. °Managing   pain, stiffness, and swelling °· Try these methods to help ease your pain: °? Using a heating pad. °? Taking a warm bath. °? Distracting yourself, such as by watching TV. °Eating and drinking °· Drink enough fluid to keep your urine  clear or pale yellow. Drink more in hot weather and during exercise. °· Limit or avoid drinking alcohol. °· Eat a balanced and nutritious diet. Eat plenty of fruits, vegetables, whole grains, and lean protein. °· Take vitamins and supplements as directed by your health care provider. °Traveling °· When traveling, keep these with you: °? Your medical information. °? The names of your health care providers. °? Your medicines. °· If you have to travel by air, ask about precautions you should take. °Activity °· Get plenty of rest. °· Avoid activities that will lower your oxygen levels, such as exercising vigorously. °General instructions °· Do not use any products that contain nicotine or tobacco, such as cigarettes and e-cigarettes. They lower blood oxygen levels. If you need help quitting, ask your health care provider. °· Consider wearing a medical alert bracelet. °· Avoid high altitudes. °· Avoid extreme temperatures and extreme temperature changes. °· Keep all follow-up visits as told by your health care provider. This is important. °Contact a health care provider if: °· You develop joint pain. °· Your feet or hands swell or have pain. °· You have fatigue. °Get help right away if: °· You have symptoms of infection. These include: °? Fever. °? Chills. °? Extreme tiredness. °? Irritability. °? Poor eating. °? Vomiting. °· You feel dizzy or faint. °· You have new abdominal pain, especially on the left side near the stomach area. °· You develop priapism. °· You have numbness in your arms or legs or have trouble moving them. °· You have trouble talking. °· You develop pain that cannot be controlled with medicine. °· You become short of breath. °· You have rapid breathing. °· You have a persistent cough. °· You have pain in your chest. °· You develop a severe headache or stiff neck. °· You feel bloated without eating or after eating a small amount of food. °· Your skin is pale. °· You suddenly lose  vision. °Summary °· Sickle cell anemia is a condition in which red blood cells have an abnormal “sickle” shape. This disease can cause organ damage and chronic pain, and it can raise your risk of infection. °· Sickle cell anemia is a genetic disorder. °· Treatment focuses on managing your symptoms and preventing complications of the disease. °· Get medical help right away if you have any signs of infection, such as a fever. °This information is not intended to replace advice given to you by your health care provider. Make sure you discuss any questions you have with your health care provider. °Document Revised: 10/21/2018 Document Reviewed: 06/11/2016 °Elsevier Patient Education © 2020 [employer] Inc[employer] ° °

## 2019-06-15 NOTE — Progress Notes (Signed)
PATIENT CARE CENTER NOTE  Diagnosis:Sickle Cell Anemia   Provider:[place], [place], [place]   Procedure:Therapeutic phlebotomy and 1 unit PRBC   Note:Patient received therapeutic phlebotomy and 500 cc blood drawn off. Immediately afterward, patient received 1 unit PRBC's. Tolerated well with no adverse reaction. Vital signs wnl. Pre-transfusion Hemoglobin 8.8. Post- transfusion hemoglobin drawn. Discharge instructions given. Patient alert, oriented and ambulatory at discharge.

## 2019-06-15 NOTE — Progress Notes (Signed)
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  It is recommended that patient maintain hemoglobin above 8.0 g/dL to promote healing. Also, patient will receive therapeutic phlebotomy monthly.   [name]  APRN, MSN, FNP-C Patient [place] [address] [place], [place] [address] [phone]

## 2019-06-16 LAB — TYPE AND SCREEN
ABO/RH(D): B POS
Antibody Screen: NEGATIVE
Unit division: 0

## 2019-06-16 LAB — BPAM RBC
Blood Product Expiration Date: 202102032359
ISSUE DATE / TIME: 202101261025
Unit Type and Rh: 5100

## 2019-06-17 ENCOUNTER — Telehealth: Payer: Self-pay | Admitting: Family Medicine

## 2019-06-18 ENCOUNTER — Other Ambulatory Visit: Payer: Self-pay | Admitting: Family Medicine

## 2019-06-18 DIAGNOSIS — G894 Chronic pain syndrome: Secondary | ICD-10-CM

## 2019-06-18 MED ORDER — OXYCODONE HCL 10 MG PO TABS: 10.0000 mg | ORAL_TABLET | ORAL | 0 refills | Status: DC | PRN

## 2019-06-18 NOTE — Telephone Encounter (Signed)
Done

## 2019-06-18 NOTE — Progress Notes (Signed)
Reviewed PDMP substance reporting system prior to prescribing opiate medications. No inconsistencies noted.  Meds ordered this encounter  Medications   Oxycodone HCl 10 MG TABS    Sig: Take 1 tablet (10 mg total) by mouth every 4 (four) hours as needed.    Dispense:  90 tablet    Refill:  0    Order Specific Question:   Supervising Provider    Answer:   JEGEDE, OLUGBEMIGA E [[id number]]   Brooke Steinhilber Moore Elvyn Krohn  APRN, MSN, FNP-C Patient [facility] [address]  [place], [place] [address] [phone]  

## 2019-07-06 ENCOUNTER — Other Ambulatory Visit: Payer: Self-pay

## 2019-07-06 ENCOUNTER — Other Ambulatory Visit: Payer: Self-pay | Admitting: Family Medicine

## 2019-07-06 ENCOUNTER — Ambulatory Visit (HOSPITAL_COMMUNITY)
Admission: RE | Admit: 2019-07-06 | Discharge: 2019-07-06 | Disposition: A | Discharge status: Home / Self Care | Payer: Medicaid Other | Source: Ambulatory Visit | Attending: Internal Medicine | Admitting: Internal Medicine

## 2019-07-06 DIAGNOSIS — D571 Sickle-cell disease without crisis: Secondary | ICD-10-CM

## 2019-07-06 LAB — CBC
HCT: 27 % — ABNORMAL LOW (ref 36.0–46.0)
HCT: 28.5 % — ABNORMAL LOW (ref 36.0–46.0)
Hemoglobin: 8.8 g/dL — ABNORMAL LOW (ref 12.0–15.0)
Hemoglobin: 9.5 g/dL — ABNORMAL LOW (ref 12.0–15.0)
MCH: 29.3 pg (ref 26.0–34.0)
MCH: 29.8 pg (ref 26.0–34.0)
MCHC: 32.6 g/dL (ref 30.0–36.0)
MCHC: 33.3 g/dL (ref 30.0–36.0)
MCV: 90 fL (ref 80.0–100.0)
MCV: 89.3 fL (ref 80.0–100.0)
Platelets: 458 10*3/uL — ABNORMAL HIGH (ref 150–400)
Platelets: 431 10*3/uL — ABNORMAL HIGH (ref 150–400)
RBC: 3 MIL/uL — ABNORMAL LOW (ref 3.87–5.11)
RBC: 3.19 MIL/uL — ABNORMAL LOW (ref 3.87–5.11)
RDW: 19.6 % — ABNORMAL HIGH (ref 11.5–15.5)
RDW: 18.2 % — ABNORMAL HIGH (ref 11.5–15.5)
WBC: 18.7 10*3/uL — ABNORMAL HIGH (ref 4.0–10.5)
WBC: 15.8 10*3/uL — ABNORMAL HIGH (ref 4.0–10.5)
nRBC: 5.6 % — ABNORMAL HIGH (ref 0.0–0.2)
nRBC: 6.3 % — ABNORMAL HIGH (ref 0.0–0.2)

## 2019-07-06 LAB — PREPARE RBC (CROSSMATCH)

## 2019-07-06 MED ORDER — DIPHENHYDRAMINE HCL 50 MG/ML IJ SOLN
25.0000 mg | Freq: Once | INTRAMUSCULAR | Status: AC
  Administered 2019-07-06: 25 mg via INTRAVENOUS
  Filled 2019-07-06: qty 1

## 2019-07-06 MED ORDER — ACETAMINOPHEN 325 MG PO TABS
650.0000 mg | ORAL_TABLET | Freq: Once | ORAL | Status: AC
  Administered 2019-07-06: 12:00:00 650 mg via ORAL
  Filled 2019-07-06: qty 2

## 2019-07-06 MED ORDER — SODIUM CHLORIDE 0.9% IV SOLUTION: Freq: Once | INTRAVENOUS | Status: AC

## 2019-07-06 NOTE — Progress Notes (Signed)
Earnestine Ciano, a 49 year old female with a history of sickle cell disease, nonhealing leg ulcers, opiate dependence and opiate tolerance, and history of anemia of chronic disease presents for exchange transfusion.  Goal is to maintain hemoglobin above 9.0 in order to promote healing of leg ulcers.  Patient will return in 2 weeks for repeat CBC.  [name]  APRN, MSN, FNP-C Patient [place] [address] [place], [place] [address] [phone]

## 2019-07-06 NOTE — Progress Notes (Signed)
PATIENT CARE CENTER NOTE  Diagnosis:Sickle Cell Anemia   Provider:[place], [place], FNP   Procedure:1 unit PRBC   Note:Patient received 1 unit of blood. Pre medications given per order. Tolerated well with no adverse reaction. Vital signs wnl. Pre-transfusion Hemoglobin 8.8. Post- transfusion hemoglobin drawn. Discharge instructions given. Patient alert, oriented and ambulatory at discharge.

## 2019-07-06 NOTE — Discharge Instructions (Signed)

## 2019-07-07 LAB — BPAM RBC
Blood Product Expiration Date: 202103132359
ISSUE DATE / TIME: 202102161233
Unit Type and Rh: 5100

## 2019-07-07 LAB — TYPE AND SCREEN
ABO/RH(D): B POS
Antibody Screen: NEGATIVE
Unit division: 0

## 2019-07-13 ENCOUNTER — Telehealth: Payer: Self-pay | Admitting: Family Medicine

## 2019-07-14 ENCOUNTER — Other Ambulatory Visit: Payer: Self-pay | Admitting: Family Medicine

## 2019-07-14 DIAGNOSIS — G894 Chronic pain syndrome: Secondary | ICD-10-CM

## 2019-07-14 MED ORDER — OXYCODONE HCL 10 MG PO TABS: 10.0000 mg | ORAL_TABLET | ORAL | 0 refills | Status: DC | PRN

## 2019-07-14 NOTE — Progress Notes (Signed)
Reviewed PDMP substance reporting system prior to prescribing opiate medications. No inconsistencies noted.  Meds ordered this encounter  Medications   Oxycodone HCl 10 MG TABS    Sig: Take 1 tablet (10 mg total) by mouth every 4 (four) hours as needed.    Dispense:  90 tablet    Refill:  0    Order Specific Question:   Supervising Provider    Answer:   JEGEDE, OLUGBEMIGA E [[id number]]   Nesiah Jump Moore Onalee Steinbach  APRN, MSN, FNP-C Patient Care Center [facility] [address]  [place], [place] [address] [phone]  

## 2019-07-16 NOTE — Telephone Encounter (Signed)
done

## 2019-07-20 ENCOUNTER — Other Ambulatory Visit: Payer: Self-pay | Admitting: Family Medicine

## 2019-07-20 ENCOUNTER — Other Ambulatory Visit: Payer: Self-pay

## 2019-07-20 ENCOUNTER — Ambulatory Visit (HOSPITAL_COMMUNITY)
Admission: RE | Admit: 2019-07-20 | Discharge: 2019-07-20 | Disposition: A | Discharge status: Home / Self Care | Payer: Medicaid Other | Source: Ambulatory Visit | Attending: Internal Medicine | Admitting: Internal Medicine

## 2019-07-20 DIAGNOSIS — D571 Sickle-cell disease without crisis: Secondary | ICD-10-CM | POA: Diagnosis present

## 2019-07-20 LAB — CBC WITH DIFFERENTIAL/PLATELET
Abs Immature Granulocytes: 0.18 10*3/uL — ABNORMAL HIGH (ref 0.00–0.07)
Basophils Absolute: 0.1 10*3/uL (ref 0.0–0.1)
Basophils Relative: 1 %
Eosinophils Absolute: 0.1 10*3/uL (ref 0.0–0.5)
Eosinophils Relative: 1 %
HCT: 26 % — ABNORMAL LOW (ref 36.0–46.0)
Hemoglobin: 8.7 g/dL — ABNORMAL LOW (ref 12.0–15.0)
Immature Granulocytes: 1 %
Lymphocytes Relative: 6 %
Lymphs Abs: 1.1 10*3/uL (ref 0.7–4.0)
MCH: 29.4 pg (ref 26.0–34.0)
MCHC: 33.5 g/dL (ref 30.0–36.0)
MCV: 87.8 fL (ref 80.0–100.0)
Monocytes Absolute: 1.9 10*3/uL — ABNORMAL HIGH (ref 0.1–1.0)
Monocytes Relative: 10 %
Neutro Abs: 14.6 10*3/uL — ABNORMAL HIGH (ref 1.7–7.7)
Neutrophils Relative %: 81 %
Platelets: 503 10*3/uL — ABNORMAL HIGH (ref 150–400)
RBC: 2.96 MIL/uL — ABNORMAL LOW (ref 3.87–5.11)
RDW: 18.4 % — ABNORMAL HIGH (ref 11.5–15.5)
WBC: 18 10*3/uL — ABNORMAL HIGH (ref 4.0–10.5)
nRBC: 3.2 % — ABNORMAL HIGH (ref 0.0–0.2)

## 2019-07-20 LAB — FERRITIN: Ferritin: 1336 ng/mL — ABNORMAL HIGH (ref 11–307)

## 2019-07-20 NOTE — Discharge Instructions (Signed)
CBC w/diff and Ferritin drawn. Patient to follow up with labs next week.

## 2019-07-20 NOTE — Progress Notes (Signed)
Patient had labs drawn (CBC and Ferritin). Tolerated well. Discharge instructions given. Patient to come back next week for possible blood transfusion. Alert, oriented and ambulatory at discharge.

## 2019-07-21 ENCOUNTER — Other Ambulatory Visit: Payer: Self-pay | Admitting: Family Medicine

## 2019-07-21 ENCOUNTER — Telehealth: Payer: Self-pay

## 2019-07-21 MED ORDER — DEFERASIROX 360 MG PO TABS: 1.0000 | ORAL_TABLET | Freq: Every day | ORAL | 0 refills | Status: DC

## 2019-07-21 NOTE — Progress Notes (Signed)
Meds ordered this encounter  Medications  . Deferasirox (JADENU) 360 MG TABS    Sig: Take 1 tablet (360 mg total) by mouth daily.    Dispense:  90 tablet    Refill:  0    Order Specific Question:   Supervising Provider    Answer:   Tresa Garter [id number]     Reviewed lab results, ferritin level greater than 1000. Will initiate iron chelator. Patient will return to clinic in 2 weeks for labs. Also, exchange transfusion at that time.    [name]  APRN, MSN, FNP-C Patient [place] [address] [place], [place] [address] [phone]

## 2019-07-21 NOTE — Telephone Encounter (Signed)
-----   Message from [name], [place] sent at 07/21/2019  2:24 PM EST ----- Regarding: lab results and medication start  Reviewed lab results, ferritin level greater than 1000. Will start a medication called Jadenu, which is an iron chelator it helps to rid the body of excess iron related to increased blood transfusions. Patient will return to clinic in 2 weeks for labs. Also, exchange transfusion at that time. Also, labs have to be reviewed every 2 weeks in the first month of taking this medications.   Some potential side effects are nausea and/or abdominal discomfort.    [name]  APRN, MSN, FNP-C Patient [place] [address] [place], [place] [address] [phone]

## 2019-07-21 NOTE — Telephone Encounter (Signed)
Called and spoke with patient, advised that labs show ferritin levels greater than 1000. Advised that provider is going to start her on Jadenu which will help her body rid the iron due to increased blood transfusions. Asked that she return to clinic in 2 weeks for labs and transfusion at that time. Advised that we will need to review labs were 2 weeks in the first month of taking this medication. Patient verbalized understanding. Thanks!

## 2019-07-30 ENCOUNTER — Other Ambulatory Visit: Payer: Self-pay

## 2019-07-30 ENCOUNTER — Ambulatory Visit (HOSPITAL_COMMUNITY)
Admission: RE | Admit: 2019-07-30 | Discharge: 2019-07-30 | Disposition: A | Discharge status: Home / Self Care | Payer: Medicaid Other | Source: Ambulatory Visit | Attending: Internal Medicine | Admitting: Internal Medicine

## 2019-07-30 ENCOUNTER — Other Ambulatory Visit: Payer: Self-pay | Admitting: Family Medicine

## 2019-07-30 DIAGNOSIS — D571 Sickle-cell disease without crisis: Secondary | ICD-10-CM | POA: Diagnosis not present

## 2019-07-30 LAB — CBC
HCT: 26.5 % — ABNORMAL LOW (ref 36.0–46.0)
Hemoglobin: 8.7 g/dL — ABNORMAL LOW (ref 12.0–15.0)
MCH: 29.4 pg (ref 26.0–34.0)
MCHC: 32.8 g/dL (ref 30.0–36.0)
MCV: 89.5 fL (ref 80.0–100.0)
Platelets: 522 10*3/uL — ABNORMAL HIGH (ref 150–400)
RBC: 2.96 MIL/uL — ABNORMAL LOW (ref 3.87–5.11)
RDW: 21.1 % — ABNORMAL HIGH (ref 11.5–15.5)
WBC: 21.3 10*3/uL — ABNORMAL HIGH (ref 4.0–10.5)
nRBC: 12.7 % — ABNORMAL HIGH (ref 0.0–0.2)

## 2019-07-30 LAB — TYPE AND SCREEN
ABO/RH(D): B POS
Antibody Screen: NEGATIVE

## 2019-07-30 NOTE — Progress Notes (Signed)
CBC and Type and screen labs were drawn. Hemoglobin was 8.7 today. Per provider, patient should make appointment to come back on 08/06/2019 for possible transfusion. Patient verbalized understanding. Patient discharged, alert, oriented and ambulatory.

## 2019-08-06 ENCOUNTER — Other Ambulatory Visit: Payer: Self-pay | Admitting: Family Medicine

## 2019-08-06 ENCOUNTER — Other Ambulatory Visit: Payer: Self-pay

## 2019-08-06 ENCOUNTER — Ambulatory Visit (HOSPITAL_COMMUNITY)
Admission: RE | Admit: 2019-08-06 | Discharge: 2019-08-06 | Disposition: A | Discharge status: Home / Self Care | Payer: Medicaid Other | Source: Ambulatory Visit | Attending: Internal Medicine | Admitting: Internal Medicine

## 2019-08-06 DIAGNOSIS — D571 Sickle-cell disease without crisis: Secondary | ICD-10-CM | POA: Diagnosis not present

## 2019-08-06 LAB — CBC
HCT: 26.3 % — ABNORMAL LOW (ref 36.0–46.0)
Hemoglobin: 8.7 g/dL — ABNORMAL LOW (ref 12.0–15.0)
MCH: 30.2 pg (ref 26.0–34.0)
MCHC: 33.1 g/dL (ref 30.0–36.0)
MCV: 91.3 fL (ref 80.0–100.0)
Platelets: 469 10*3/uL — ABNORMAL HIGH (ref 150–400)
RBC: 2.88 MIL/uL — ABNORMAL LOW (ref 3.87–5.11)
RDW: 21.9 % — ABNORMAL HIGH (ref 11.5–15.5)
WBC: 19.6 10*3/uL — ABNORMAL HIGH (ref 4.0–10.5)
nRBC: 14.1 % — ABNORMAL HIGH (ref 0.0–0.2)

## 2019-08-06 LAB — HEMOGLOBIN AND HEMATOCRIT, BLOOD
HCT: 29.6 % — ABNORMAL LOW (ref 36.0–46.0)
Hemoglobin: 9.7 g/dL — ABNORMAL LOW (ref 12.0–15.0)

## 2019-08-06 LAB — PREPARE RBC (CROSSMATCH)

## 2019-08-06 MED ORDER — ACETAMINOPHEN 325 MG PO TABS
650.0000 mg | ORAL_TABLET | Freq: Once | ORAL | Status: AC
  Administered 2019-08-06: 650 mg via ORAL
  Filled 2019-08-06: qty 2

## 2019-08-06 MED ORDER — SODIUM CHLORIDE 0.9% IV SOLUTION: Freq: Once | INTRAVENOUS | Status: AC

## 2019-08-06 MED ORDER — DIPHENHYDRAMINE HCL 25 MG PO CAPS
25.0000 mg | ORAL_CAPSULE | Freq: Once | ORAL | Status: AC
  Administered 2019-08-06: 11:00:00 25 mg via ORAL
  Filled 2019-08-06: qty 1

## 2019-08-06 NOTE — Progress Notes (Signed)
Patient received via 1 unit of packed red blood cells. Type and screen was done before transfusion. Pre transfusion medications were given. Post transfusion H&H was drawn. Tolerated well, vitals stable, discharge instructions given, verbalized understanding. Patient alert, oriented and ambulatory at the time of discharge.

## 2019-08-06 NOTE — Discharge Instructions (Signed)

## 2019-08-07 LAB — TYPE AND SCREEN
ABO/RH(D): B POS
Antibody Screen: NEGATIVE
Unit division: 0

## 2019-08-07 LAB — BPAM RBC
Blood Product Expiration Date: 202104102359
ISSUE DATE / TIME: 202103191100
Unit Type and Rh: 5100

## 2019-08-11 ENCOUNTER — Telehealth: Payer: Self-pay | Admitting: Family Medicine

## 2019-08-13 ENCOUNTER — Other Ambulatory Visit: Payer: Self-pay | Admitting: Family Medicine

## 2019-08-13 DIAGNOSIS — G894 Chronic pain syndrome: Secondary | ICD-10-CM

## 2019-08-13 MED ORDER — OXYCODONE HCL 10 MG PO TABS: 10.0000 mg | ORAL_TABLET | ORAL | 0 refills | Status: DC | PRN

## 2019-08-13 NOTE — Progress Notes (Signed)
Reviewed PDMP substance reporting system prior to prescribing opiate medications. No inconsistencies noted.  Meds ordered this encounter  Medications   Oxycodone HCl 10 MG TABS    Sig: Take 1 tablet (10 mg total) by mouth every 4 (four) hours as needed.    Dispense:  90 tablet    Refill:  0    Order Specific Question:   Supervising Provider    Answer:   JEGEDE, OLUGBEMIGA E [[id number]]   Damarco Keysor Moore Tristan Proto  APRN, MSN, FNP-C Patient Care Center [facility] [address]  [place], [place] [address] [phone]  

## 2019-08-17 NOTE — Telephone Encounter (Signed)
done

## 2019-08-31 ENCOUNTER — Other Ambulatory Visit: Payer: Self-pay

## 2019-08-31 DIAGNOSIS — D571 Sickle-cell disease without crisis: Secondary | ICD-10-CM

## 2019-08-31 MED ORDER — IBUPROFEN 600 MG PO TABS: ORAL_TABLET | ORAL | 2 refills | Status: DC

## 2019-09-09 ENCOUNTER — Telehealth: Payer: Self-pay | Admitting: Family Medicine

## 2019-09-09 NOTE — Telephone Encounter (Signed)
Pt requested refill on oxycodone

## 2019-09-10 ENCOUNTER — Other Ambulatory Visit: Payer: Self-pay | Admitting: Family Medicine

## 2019-09-10 DIAGNOSIS — G894 Chronic pain syndrome: Secondary | ICD-10-CM

## 2019-09-10 MED ORDER — OXYCODONE HCL 10 MG PO TABS: 10.0000 mg | ORAL_TABLET | ORAL | 0 refills | Status: DC | PRN

## 2019-09-10 NOTE — Progress Notes (Signed)
Reviewed PDMP substance reporting system prior to prescribing opiate medications. No inconsistencies noted.  Meds ordered this encounter  Medications   Oxycodone HCl 10 MG TABS    Sig: Take 1 tablet (10 mg total) by mouth every 4 (four) hours as needed.    Dispense:  90 tablet    Refill:  0    Order Specific Question:   Supervising Provider    Answer:   JEGEDE, OLUGBEMIGA E [[id number]]   Tonjia Parillo Moore Devetta Hagenow  APRN, MSN, FNP-C Patient Care Center [facility] [address]  [place], [place] [address] [phone]  

## 2019-10-05 ENCOUNTER — Telehealth: Payer: Self-pay | Admitting: Family Medicine

## 2019-10-05 NOTE — Telephone Encounter (Signed)
Pt called in medication refill for oxycodone 10mg .

## 2019-10-06 ENCOUNTER — Other Ambulatory Visit: Payer: Self-pay | Admitting: Family Medicine

## 2019-10-06 DIAGNOSIS — G894 Chronic pain syndrome: Secondary | ICD-10-CM

## 2019-10-06 MED ORDER — OXYCODONE HCL 10 MG PO TABS: 10.0000 mg | ORAL_TABLET | ORAL | 0 refills | Status: DC | PRN

## 2019-10-06 NOTE — Progress Notes (Signed)
Reviewed PDMP substance reporting system prior to prescribing opiate medications. No inconsistencies noted.  Meds ordered this encounter  Medications   Oxycodone HCl 10 MG TABS    Sig: Take 1 tablet (10 mg total) by mouth every 4 (four) hours as needed.    Dispense:  90 tablet    Refill:  0    Order Specific Question:   Supervising Provider    Answer:   JEGEDE, OLUGBEMIGA E [[id number]]   Lachina Moore Hollis  APRN, MSN, FNP-C Patient [facility] Center [facility] [address]  [place], [place] [address] [phone]  

## 2019-11-08 ENCOUNTER — Other Ambulatory Visit: Payer: Self-pay | Admitting: Family Medicine

## 2019-11-08 ENCOUNTER — Telehealth: Payer: Self-pay | Admitting: Family Medicine

## 2019-11-08 DIAGNOSIS — G894 Chronic pain syndrome: Secondary | ICD-10-CM

## 2019-11-08 MED ORDER — OXYCODONE HCL 10 MG PO TABS: 10.0000 mg | ORAL_TABLET | ORAL | 0 refills | Status: DC | PRN

## 2019-11-08 NOTE — Progress Notes (Signed)
Reviewed PDMP substance reporting system prior to prescribing opiate medications. No inconsistencies noted.  Meds ordered this encounter  Medications   Oxycodone HCl 10 MG TABS    Sig: Take 1 tablet (10 mg total) by mouth every 4 (four) hours as needed.    Dispense:  90 tablet    Refill:  0    Order Specific Question:   Supervising Provider    Answer:   JEGEDE, OLUGBEMIGA E [[id number]]   [name] Leifheit Moore Alwilda Gilland  APRN, MSN, FNP-C Patient Care Center [facility] [address]  [place], [place] [address] [phone]  

## 2019-11-12 NOTE — Telephone Encounter (Signed)
Done

## 2019-11-17 DIAGNOSIS — M87061 Idiopathic aseptic necrosis of right tibia: Secondary | ICD-10-CM | POA: Insufficient documentation

## 2019-12-06 ENCOUNTER — Telehealth: Payer: Self-pay | Admitting: Family Medicine

## 2019-12-06 NOTE — Telephone Encounter (Signed)
holl 

## 2019-12-07 ENCOUNTER — Other Ambulatory Visit: Payer: Self-pay

## 2019-12-07 ENCOUNTER — Other Ambulatory Visit: Payer: Self-pay | Admitting: Family Medicine

## 2019-12-07 DIAGNOSIS — D571 Sickle-cell disease without crisis: Secondary | ICD-10-CM

## 2019-12-07 DIAGNOSIS — G894 Chronic pain syndrome: Secondary | ICD-10-CM

## 2019-12-07 MED ORDER — IBUPROFEN 600 MG PO TABS: ORAL_TABLET | ORAL | 2 refills | Status: DC

## 2019-12-07 MED ORDER — OXYCODONE HCL 10 MG PO TABS: 10.0000 mg | ORAL_TABLET | ORAL | 0 refills | Status: DC | PRN

## 2019-12-07 NOTE — Progress Notes (Signed)
Reviewed PDMP substance reporting system prior to prescribing opiate medications. No inconsistencies noted.  Meds ordered this encounter  Medications   Oxycodone HCl 10 MG TABS    Sig: Take 1 tablet (10 mg total) by mouth every 4 (four) hours as needed.    Dispense:  90 tablet    Refill:  0    Order Specific Question:   Supervising Provider    Answer:   JEGEDE, OLUGBEMIGA E [[id number]]   [name] Overacker Moore Elenna Spratling  APRN, MSN, FNP-C Patient Care Center [facility] [address]  [place], [place] [address] [phone]  

## 2019-12-15 DIAGNOSIS — J9601 Acute respiratory failure with hypoxia: Secondary | ICD-10-CM | POA: Insufficient documentation

## 2019-12-21 ENCOUNTER — Telehealth (INDEPENDENT_AMBULATORY_CARE_PROVIDER_SITE_OTHER): Payer: Medicaid Other | Admitting: Family Medicine

## 2019-12-21 ENCOUNTER — Encounter: Payer: Self-pay | Admitting: Family Medicine

## 2019-12-21 ENCOUNTER — Other Ambulatory Visit: Payer: Self-pay

## 2019-12-21 DIAGNOSIS — G894 Chronic pain syndrome: Secondary | ICD-10-CM | POA: Diagnosis not present

## 2019-12-21 DIAGNOSIS — D571 Sickle-cell disease without crisis: Secondary | ICD-10-CM | POA: Diagnosis not present

## 2019-12-21 DIAGNOSIS — L97312 Non-pressure chronic ulcer of right ankle with fat layer exposed: Secondary | ICD-10-CM

## 2019-12-21 NOTE — Progress Notes (Signed)
Patient [name]  Provider location: [place] Patient location: Home  Virtual Visit via Telephone Note  I connected with Paisyn Guercio on 12/25/19 at  2:40 PM EDT by telephone and verified that I am speaking with the correct person using two identifiers.   I discussed the limitations, risks, security and privacy concerns of performing an evaluation and management service by telephone and the availability of in person appointments. I also discussed with the patient that there may be a patient responsible charge related to this service. The patient expressed understanding and agreed to proceed.   History of Present Illness: [name] is a 49 year old female with a medical history significant for sickle cell disease, chronic pain syndrome, opiate dependence and tolerance, and long history of right lower extremity ulcers primarily to right lateral and medial ankle since via telephone for a follow-up of sickle cell disease.  Patient has been unable to follow-up in person due to her procedures involving right lower extremity ulcer.  Patient underwent skin graft 2021 without patient.  She continues to follow with wound [name].  Has a repeat procedure in the coming weeks.  Patient continues to have chronic pain related to lower extremities right.  Current pain intensity is 7/10 characterized as part of pain.  She denies any headache, chest pain, urinary symptoms, nausea, vomiting, or diarrhea.  She has no complaints of fever, chills, recent travel, or exposure to COVID-19.   Past Medical History:  Diagnosis Date  . Anemia 03/30/2012   Hx of sickle cell disease  . CAP (community acquired pneumonia)    2014  . Cholelithiasis 03/30/2012  . Chronic wound of extremity 04/01/2012  . Elevated LFTs   . Leg ulcer ([place]) 10/27/2012   Chronic under [name] of wound clinic  . Multiple open wounds of lower extremity    chronic wounds B/LLE  .  Sickle cell disease ([place])   . Sinus bradycardia by electrocardiogram 04/03/2012   Social History   Socioeconomic History  . Marital status: Married    Spouse name: [name]  . Number of children: 1  . Years of education: Not on file  . Highest education level: Not on file  Occupational History  . Occupation: Employed in home [name].  Tobacco Use  . Smoking status: Never Smoker  . Smokeless tobacco: Never Used  Vaping Use  . Vaping Use: Never used  Substance and Sexual Activity  . Alcohol use: No  . Drug use: No  . Sexual activity: Yes    Birth control/protection: None  Other Topics Concern  . Not on file  Social History Narrative   Lives with husband.   Social Determinants of Health   Financial Resource Strain:   . Difficulty of Paying Living Expenses:   Food Insecurity:   . Worried About Charity fundraiser in the Last Year:   . Arboriculturist in the Last Year:   Transportation Needs:   . Film/video editor (Medical):   Marland Kitchen Lack of Transportation (Non-Medical):   Physical Activity:   . Days of Exercise per Week:   . Minutes of Exercise per Session:   Stress:   . Feeling of Stress :   Social Connections:   . Frequency of Communication with Friends and Family:   . Frequency of Social Gatherings with Friends and Family:   . Attends Religious Services:   . Active Member of Clubs or Organizations:   .  Attends Archivist Meetings:   Marland Kitchen Marital Status:   Intimate Partner Violence:   . Fear of Current or Ex-Partner:   . Emotionally Abused:   Marland Kitchen Physically Abused:   . Sexually Abused:    Immunization History  Administered Date(s) Administered  . Influenza,inj,Quad PF,6+ Mos 03/16/2013, 01/20/2014, 03/01/2015, 02/19/2016, 02/14/2017, 02/02/2018  . Pneumococcal Conjugate-13 12/14/2015  . Pneumococcal Polysaccharide-23 03/24/2014  . Tdap 11/25/2013   No Known Allergies  Review of Systems  Constitutional: Negative for chills and fever.  Eyes:  Negative.   Respiratory: Negative.   Cardiovascular: Negative.   Gastrointestinal: Negative.   Genitourinary: Negative.   Musculoskeletal: Positive for joint pain.  Skin:       Right lower extremity ulcer  Neurological: Negative.   Psychiatric/Behavioral: Negative.       Assessment and Plan:  Hb-SS disease without crisis ([place]) Sickle cell disease -Continue folic acid 1 mg daily to prevent aplastic bone marrow crises.   Pulmonary evaluation - Patient denies severe recurrent wheezes, shortness of breath with exercise, or persistent cough. If these symptoms develop, pulmonary function tests with spirometry will be ordered, and if abnormal, plan on referral to Pulmonology for further evaluation.  Eye - High risk of proliferative retinopathy. Annual eye exam with retinal exam recommended to patient.   Patient will return to clinic for labs in 1 month.   Chronic pain syndrome Pain well controlled on current medication regimen. No changes warranted at this time.   Non-healing ulcer of ankle with fat layer exposed, right ([place]) Defer to wound [name] for management of this problem.    Follow Up Instructions:    I discussed the assessment and treatment plan with the patient. The patient was provided an opportunity to ask questions and all were answered. The patient agreed with the plan and demonstrated an understanding of the instructions.   The patient was advised to call back or seek an in-person evaluation if the symptoms worsen or if the condition fails to improve as anticipated.  I provided 10 minutes of non-face-to-face time during this encounter.   [name] Pounds  APRN, MSN, FNP-C Patient [place] [address] [place], [place] [address] [phone]

## 2020-01-12 DIAGNOSIS — T86828 Other complications of skin graft (allograft) (autograft): Secondary | ICD-10-CM | POA: Insufficient documentation

## 2020-01-19 DIAGNOSIS — T86821 Skin graft (allograft) (autograft) failure: Secondary | ICD-10-CM | POA: Insufficient documentation

## 2020-01-19 DIAGNOSIS — I878 Other specified disorders of veins: Secondary | ICD-10-CM | POA: Insufficient documentation

## 2020-01-27 ENCOUNTER — Telehealth: Payer: Self-pay | Admitting: Family Medicine

## 2020-01-28 ENCOUNTER — Other Ambulatory Visit: Payer: Self-pay | Admitting: Family Medicine

## 2020-01-28 DIAGNOSIS — G894 Chronic pain syndrome: Secondary | ICD-10-CM

## 2020-01-28 MED ORDER — OXYCODONE HCL 10 MG PO TABS: 10.0000 mg | ORAL_TABLET | ORAL | 0 refills | Status: AC | PRN

## 2020-01-28 NOTE — Progress Notes (Signed)
Reviewed PDMP substance reporting system prior to prescribing opiate medications. No inconsistencies noted.  Meds ordered this encounter  Medications   Oxycodone HCl 10 MG TABS    Sig: Take 1 tablet (10 mg total) by mouth every 4 (four) hours as needed for up to 15 days.    Dispense:  90 tablet    Refill:  0    Order Specific Question:   Supervising Provider    Answer:   JEGEDE, OLUGBEMIGA E [[id number]]   Ermal Brzozowski Moore Freddi Forster  APRN, MSN, FNP-C Patient Care Center [facility] [address]  [place], [place] [address] [phone]  

## 2020-01-28 NOTE — Telephone Encounter (Signed)
Done

## 2020-02-09 DIAGNOSIS — Z9189 Other specified personal risk factors, not elsewhere classified: Secondary | ICD-10-CM | POA: Insufficient documentation

## 2020-02-24 ENCOUNTER — Telehealth: Payer: Self-pay | Admitting: Family Medicine

## 2020-02-25 ENCOUNTER — Other Ambulatory Visit: Payer: Self-pay | Admitting: Family Medicine

## 2020-02-25 DIAGNOSIS — G894 Chronic pain syndrome: Secondary | ICD-10-CM

## 2020-02-25 MED ORDER — OXYCODONE HCL 10 MG PO TABS: ORAL_TABLET | ORAL | 0 refills | Status: AC

## 2020-02-25 NOTE — Telephone Encounter (Signed)
Done

## 2020-02-25 NOTE — Progress Notes (Signed)
Reviewed PDMP substance reporting system prior to prescribing opiate medications. No inconsistencies noted.   Meds ordered this encounter  Medications  . Oxycodone HCl 10 MG TABS    Sig: Take 1 tablet (10 mg total) by mouth every 4 (four) hours as needed for 15 days, THEN 1 tablet (10 mg total) every 4 (four) hours as needed for up to 15 days.    Dispense:  90 tablet    Refill:  0    Order Specific Question:   Supervising Provider    Answer:   Tresa Garter [[id number]]     [name]  APRN, MSN, FNP-C Patient [place] [address] [place], [place] [address] [phone]

## 2020-03-28 ENCOUNTER — Telehealth: Payer: Self-pay | Admitting: Family Medicine

## 2020-03-28 ENCOUNTER — Other Ambulatory Visit: Payer: Self-pay | Admitting: Family Medicine

## 2020-03-28 DIAGNOSIS — G894 Chronic pain syndrome: Secondary | ICD-10-CM

## 2020-03-28 MED ORDER — OXYCODONE HCL 10 MG PO TABS: 10.0000 mg | ORAL_TABLET | Freq: Four times a day (QID) | ORAL | 0 refills | Status: DC | PRN

## 2020-03-28 NOTE — Progress Notes (Signed)
Meds ordered this encounter  Medications   Oxycodone HCl 10 MG TABS    Sig: Take 1 tablet (10 mg total) by mouth every 6 (six) hours as needed.    Dispense:  60 tablet    Refill:  0    Order Specific Question:   Supervising Provider    Answer:   JEGEDE, OLUGBEMIGA E [[id number]]   Reviewed PDMP substance reporting system prior to prescribing opiate medications. No inconsistencies noted.      Laquon Emel Moore Canda Podgorski  APRN, MSN, FNP-C Patient [facility] [address]  [place], [place] [address] [phone]  

## 2020-03-29 NOTE — Telephone Encounter (Signed)
Done

## 2020-04-06 ENCOUNTER — Other Ambulatory Visit: Payer: Self-pay | Admitting: Family Medicine

## 2020-04-06 DIAGNOSIS — D571 Sickle-cell disease without crisis: Secondary | ICD-10-CM

## 2020-04-17 ENCOUNTER — Telehealth: Payer: Self-pay | Admitting: Family Medicine

## 2020-04-17 ENCOUNTER — Other Ambulatory Visit: Payer: Self-pay | Admitting: Family Medicine

## 2020-04-17 DIAGNOSIS — D571 Sickle-cell disease without crisis: Secondary | ICD-10-CM

## 2020-04-17 MED ORDER — IBUPROFEN 600 MG PO TABS: ORAL_TABLET | ORAL | 2 refills | Status: DC

## 2020-04-17 NOTE — Progress Notes (Signed)
Meds ordered this encounter  Medications   ibuprofen (ADVIL) 600 MG tablet    Sig: TAKE 1 TABLET(600 MG) BY MOUTH EVERY 6 HOURS    Dispense:  60 tablet    Refill:  2    Order Specific Question:   Supervising Provider    Answer:   Tresa Garter [[id number]]     [name]  APRN, MSN, FNP-C Patient [place] [address] [place], [place] [address] [phone]

## 2020-04-18 NOTE — Telephone Encounter (Signed)
Done

## 2020-04-21 MED FILL — CELECOXIB 200 MG CAP: 200 | 30 days supply | Qty: 60 | Fill #0

## 2020-04-21 MED FILL — GABAPENTIN 300 MG CAPSULE: 300 | 22 days supply | Qty: 90 | Fill #0

## 2020-04-26 ENCOUNTER — Telehealth: Payer: Self-pay | Admitting: Family Medicine

## 2020-04-26 ENCOUNTER — Other Ambulatory Visit: Payer: Self-pay | Admitting: Family Medicine

## 2020-04-26 DIAGNOSIS — G894 Chronic pain syndrome: Secondary | ICD-10-CM

## 2020-04-26 MED ORDER — OXYCODONE HCL 10 MG PO TABS: 10.0000 mg | ORAL_TABLET | Freq: Four times a day (QID) | ORAL | 0 refills | Status: DC | PRN

## 2020-04-26 NOTE — Telephone Encounter (Signed)
Done

## 2020-04-26 NOTE — Progress Notes (Signed)
Reviewed PDMP substance reporting system prior to prescribing opiate medications. No inconsistencies noted.  Meds ordered this encounter  Medications   Oxycodone HCl 10 MG TABS    Sig: Take 1 tablet (10 mg total) by mouth every 6 (six) hours as needed.    Dispense:  60 tablet    Refill:  0    Order Specific Question:   Supervising Provider    Answer:   JEGEDE, OLUGBEMIGA E [[id number]]   [name] Yania Bogie  APRN, MSN, FNP-C [facility] [address]  [place], [place] [address] [phone]  

## 2020-05-01 ENCOUNTER — Telehealth (HOSPITAL_COMMUNITY): Payer: Self-pay | Admitting: General Practice

## 2020-05-01 ENCOUNTER — Encounter (HOSPITAL_COMMUNITY): Payer: Self-pay | Admitting: Internal Medicine

## 2020-05-01 ENCOUNTER — Non-Acute Institutional Stay (HOSPITAL_COMMUNITY)
Admission: AD | Admit: 2020-05-01 | Discharge: 2020-05-01 | Disposition: A | Discharge status: Home / Self Care | Payer: Medicaid Other | Source: Ambulatory Visit | Attending: Internal Medicine | Admitting: Internal Medicine

## 2020-05-01 DIAGNOSIS — F1120 Opioid dependence, uncomplicated: Secondary | ICD-10-CM | POA: Insufficient documentation

## 2020-05-01 DIAGNOSIS — D649 Anemia, unspecified: Secondary | ICD-10-CM

## 2020-05-01 DIAGNOSIS — Z832 Family history of diseases of the blood and blood-forming organs and certain disorders involving the immune mechanism: Secondary | ICD-10-CM | POA: Insufficient documentation

## 2020-05-01 DIAGNOSIS — D5700 Hb-SS disease with crisis, unspecified: Secondary | ICD-10-CM | POA: Diagnosis present

## 2020-05-01 DIAGNOSIS — M79602 Pain in left arm: Secondary | ICD-10-CM | POA: Insufficient documentation

## 2020-05-01 DIAGNOSIS — D638 Anemia in other chronic diseases classified elsewhere: Secondary | ICD-10-CM | POA: Diagnosis not present

## 2020-05-01 DIAGNOSIS — G894 Chronic pain syndrome: Secondary | ICD-10-CM | POA: Insufficient documentation

## 2020-05-01 LAB — CBC WITH DIFFERENTIAL/PLATELET
Abs Immature Granulocytes: 0.31 10*3/uL — ABNORMAL HIGH (ref 0.00–0.07)
Basophils Absolute: 0.1 10*3/uL (ref 0.0–0.1)
Basophils Relative: 0 %
Eosinophils Absolute: 0.1 10*3/uL (ref 0.0–0.5)
Eosinophils Relative: 0 %
HCT: 17.8 % — ABNORMAL LOW (ref 36.0–46.0)
Hemoglobin: 6.1 g/dL — CL (ref 12.0–15.0)
Immature Granulocytes: 2 %
Lymphocytes Relative: 12 %
Lymphs Abs: 2.5 10*3/uL (ref 0.7–4.0)
MCH: 32.1 pg (ref 26.0–34.0)
MCHC: 34.3 g/dL (ref 30.0–36.0)
MCV: 93.7 fL (ref 80.0–100.0)
Monocytes Absolute: 2.6 10*3/uL — ABNORMAL HIGH (ref 0.1–1.0)
Monocytes Relative: 13 %
Neutro Abs: 15.3 10*3/uL — ABNORMAL HIGH (ref 1.7–7.7)
Neutrophils Relative %: 73 %
Platelets: 468 10*3/uL — ABNORMAL HIGH (ref 150–400)
RBC: 1.9 MIL/uL — ABNORMAL LOW (ref 3.87–5.11)
RDW: 26.3 % — ABNORMAL HIGH (ref 11.5–15.5)
WBC: 20.9 10*3/uL — ABNORMAL HIGH (ref 4.0–10.5)
nRBC: 40.4 % — ABNORMAL HIGH (ref 0.0–0.2)

## 2020-05-01 LAB — COMPREHENSIVE METABOLIC PANEL
ALT: 18 U/L (ref 0–44)
AST: 26 U/L (ref 15–41)
Albumin: 4.4 g/dL (ref 3.5–5.0)
Alkaline Phosphatase: 68 U/L (ref 38–126)
Anion gap: 11 (ref 5–15)
BUN: 12 mg/dL (ref 6–20)
CO2: 21 mmol/L — ABNORMAL LOW (ref 22–32)
Calcium: 9 mg/dL (ref 8.9–10.3)
Chloride: 103 mmol/L (ref 98–111)
Creatinine, Ser: 0.44 mg/dL (ref 0.44–1.00)
GFR, Estimated: >60 mL/min (ref >60)
Glucose, Bld: 103 mg/dL — ABNORMAL HIGH (ref 70–99)
Potassium: 3.5 mmol/L (ref 3.5–5.1)
Sodium: 135 mmol/L (ref 135–145)
Total Bilirubin: 5.9 mg/dL — ABNORMAL HIGH (ref 0.3–1.2)
Total Protein: 8.6 g/dL — ABNORMAL HIGH (ref 6.5–8.1)

## 2020-05-01 LAB — CBC
HCT: 19.7 % — ABNORMAL LOW (ref 36.0–46.0)
Hemoglobin: 6.8 g/dL — CL (ref 12.0–15.0)
MCH: 32.1 pg (ref 26.0–34.0)
MCHC: 34.5 g/dL (ref 30.0–36.0)
MCV: 92.9 fL (ref 80.0–100.0)
Platelets: 384 10*3/uL (ref 150–400)
RBC: 2.12 MIL/uL — ABNORMAL LOW (ref 3.87–5.11)
RDW: 22.2 % — ABNORMAL HIGH (ref 11.5–15.5)
WBC: 17.3 10*3/uL — ABNORMAL HIGH (ref 4.0–10.5)
nRBC: 37.9 % — ABNORMAL HIGH (ref 0.0–0.2)

## 2020-05-01 LAB — RETICULOCYTES
Immature Retic Fract: 54.5 % — ABNORMAL HIGH (ref 2.3–15.9)
RBC.: 1.91 MIL/uL — ABNORMAL LOW (ref 3.87–5.11)
Retic Count, Absolute: 412.8 10*3/uL — ABNORMAL HIGH (ref 19.0–186.0)
Retic Ct Pct: 21.6 % — ABNORMAL HIGH (ref 0.4–3.1)

## 2020-05-01 LAB — PREGNANCY, URINE: Preg Test, Ur: NEGATIVE

## 2020-05-01 LAB — PREPARE RBC (CROSSMATCH)

## 2020-05-01 MED ORDER — DIPHENHYDRAMINE HCL 25 MG PO CAPS
25.0000 mg | ORAL_CAPSULE | Freq: Once | ORAL | Status: AC
  Administered 2020-05-01: 14:00:00 25 mg via ORAL
  Filled 2020-05-01: qty 1

## 2020-05-01 MED ORDER — ACETAMINOPHEN 325 MG PO TABS: 650.0000 mg | ORAL_TABLET | Freq: Once | ORAL | Status: DC

## 2020-05-01 MED ORDER — SODIUM CHLORIDE 0.9% FLUSH: 9.0000 mL | INTRAVENOUS | Status: DC | PRN

## 2020-05-01 MED ORDER — DIPHENHYDRAMINE HCL 25 MG PO CAPS: 25.0000 mg | ORAL_CAPSULE | ORAL | Status: DC | PRN

## 2020-05-01 MED ORDER — NALOXONE HCL 0.4 MG/ML IJ SOLN: 0.4000 mg | INTRAMUSCULAR | Status: DC | PRN

## 2020-05-01 MED ORDER — ONDANSETRON HCL 4 MG/2ML IJ SOLN: 4.0000 mg | Freq: Four times a day (QID) | INTRAMUSCULAR | Status: DC | PRN

## 2020-05-01 MED ORDER — SODIUM CHLORIDE 0.9% IV SOLUTION: Freq: Once | INTRAVENOUS | Status: AC

## 2020-05-01 MED ORDER — KETOROLAC TROMETHAMINE 30 MG/ML IJ SOLN
15.0000 mg | Freq: Once | INTRAMUSCULAR | Status: AC
  Administered 2020-05-01: 14:00:00 15 mg via INTRAVENOUS
  Filled 2020-05-01 (×2): qty 1

## 2020-05-01 MED ORDER — DEXTROSE-NACL 5-0.45 % IV SOLN: INTRAVENOUS | Status: DC

## 2020-05-01 MED ORDER — HYDROMORPHONE 1 MG/ML IV SOLN
INTRAVENOUS | Status: DC
  Administered 2020-05-01: 30 mg via INTRAVENOUS
  Administered 2020-05-01: 10.5 mg via INTRAVENOUS
  Filled 2020-05-01: qty 30

## 2020-05-01 MED ORDER — ACETAMINOPHEN 500 MG PO TABS
1000.0000 mg | ORAL_TABLET | Freq: Once | ORAL | Status: AC
  Administered 2020-05-01: 11:00:00 1000 mg via ORAL
  Filled 2020-05-01: qty 2

## 2020-05-01 NOTE — Telephone Encounter (Signed)
Patient called, requesting to come to the day hospital due to pain in the left hand rated at 10/10. Denied chest pain, fever, diarrhea, abdominal pain, nausea/vomitting. Screened negative for Covid-19 symptoms. Admitted to having means of transportation without driving self after treatment. Last took 10 mg of oxycodone at 05:30 am today. Per provider, patient can come to the day hospital for treatment. Patient notified, verbalized understanding.

## 2020-05-01 NOTE — Discharge Summary (Signed)
[facility] Discharge Summary   Patient ID: [name] MRN: [id number] DOB/AGE: 02/07/1971 49 y.o.  Admit date: 05/01/2020 Discharge date: 05/01/2020  Primary Care Physician:  [name], FNP  Admission Diagnoses:  Principal Problem:   Hb-SS disease with vaso-occlusive crisis [facility]) Active Problems:   Symptomatic anemia   Discharge Medications:  Allergies as of 05/01/2020   No Known Allergies     Medication List    TAKE these medications   Deferasirox 360 MG Tabs Commonly known as: Jadenu Take 1 tablet (360 mg total) by mouth daily.   feeding supplement (PRO-STAT 64) Liqd Take 30 mLs by mouth 3 (three) times daily with meals.   folic acid 1 MG tablet Commonly known as: FOLVITE Take 5 tablets (5 mg total) by mouth daily.   ibuprofen 600 MG tablet Commonly known as: ADVIL TAKE 1 TABLET(600 MG) BY MOUTH EVERY 6 HOURS   Oxycodone HCl 10 MG Tabs Take 1 tablet (10 mg total) by mouth every 6 (six) hours as needed.   predniSONE 5 MG tablet Commonly known as: DELTASONE Take 5 mg by mouth 2 (two) times daily with a meal.        Consults:  None  Significant Diagnostic Studies:  No results found.   History of present illness: [name] is a 49 year old female with a medical history significant for sickle cell disease, chronic pain syndrome, opiate dependence and tolerance, right lower extremity ulcers, nonhealing and history of anemia of chronic disease presents complaining of left arm pain that is consistent with previous sickle cell pain crisis.  Patient says that pain intensity has been elevated over the past 24 hours and has been unrelieved by home medications.  Is status post right ankle skin graft for nonhealing ulcer.  She was started on prednisone on last Thursday and states that pain intensity increased after completing course.  She characterizes pain as intermittent and sharp.  She last had oxycodone this a.m. without sustained  relief.  She denies any headache, shortness of breath, fever, chills, chest pain, urinary symptoms, nausea, vomiting, or diarrhea. [facility] Course: Patient admitted to sickle cell day clinic for management of pain crisis. Reviewed all laboratory values, hemoglobin 6.1, which is below patient's baseline of 8-9 g/dL.  WBCs elevated at 20.9, appears to be chronic.  Also, patient recently completed a course of steroids.  She is afebrile.  Pain managed with IV Dilaudid via PCA with settings of 0.5 mg, 10-minute lockout, and 3 mg/h. Toradol 15 mg IV x1 Tylenol 1000 mg x 1 IV fluids, D5 0.45% saline at 125 mL/h Pain intensity decreased to 4/10.  Patient does not warrant admission at this time.  Sickle cell anemia: Hemoglobin 6.1, below patient's baseline.  Transfuse 1 unit PRBCs.  Patient received without complication.  Post transfusion H&H pending. If hemoglobin less than 7.0 g/dL, patient will return to clinic on 05/02/2020 for second unit of PRBCs.  Patient expressed understanding.  Pain intensity decreased to 4/10.  Patient is alert, oriented, and ambulating without assistance.  She will discharge home in a hemodynamically stable condition.  Discharge instructions:  Resume all home medications.   Follow up with PCP as previously  scheduled.   Discussed the importance of drinking 64 ounces of water daily, dehydration of red blood cells may lead further sickling.   Avoid all stressors that precipitate sickle cell pain crisis.     The patient was given clear instructions to go to ER or return to  medical center if symptoms do not improve, worsen or new problems develop.      Physical Exam at Discharge:  BP 127/74 (BP Location: Right Arm)   Pulse 68   Temp 98.1 F (36.7 C) (Oral)   Resp 18   LMP 03/23/2020   SpO2 99%   Physical Exam Constitutional:      Appearance: Normal appearance.  Cardiovascular:     Rate and Rhythm: Normal rate and regular rhythm.   Pulmonary:     Effort: Pulmonary effort is normal.  Abdominal:     General: Abdomen is flat. Bowel sounds are normal.  Skin:    Comments: Ulcer to right ankle, dressing clean, dry and intact  Neurological:     Mental Status: She is alert.  Psychiatric:        Mood and Affect: Mood normal.        Thought Content: Thought content normal.        Judgment: Judgment normal.      Disposition at Discharge: Discharge disposition: 01-Home or Self Care       Discharge Orders: Discharge Instructions    Discharge patient   Complete by: As directed    Discharge disposition: 01-Home or Self Care   Discharge patient date: 05/01/2020      Condition at Discharge:   Stable  Time spent on Discharge:  Greater than 30 minutes.  Signed: [name]  APRN, MSN, FNP-C Patient [facility] [address] [place], [place] [address] [phone]  05/01/2020, 5:19 PM

## 2020-05-01 NOTE — Progress Notes (Signed)
Patient admitted to the day hospital for treatment of sickle cell pain crisis. Patient reported pain rated 10/10 in the left hand.  Patient placed on Dilaudid PCA, given PO benadryl, PO tylenol and hydrated with IV fluids. Type and screen, CBC, CMP  labs were drawn. Hemoglobin was 6.1. 1 unit of packed red blood cells was transfused. Patient told to keep the blood wrist band in place due to the possibility of another transfusion tomorrow that will be based on the post transfusion hemoglobin level.  At discharge patient reported  pain at 4/10. Discharge instructions given to patient. Alert, oriented and ambulatory at discharge.

## 2020-05-01 NOTE — Discharge Instructions (Signed)
Sickle Cell Anemia, Adult  Sickle cell anemia is a condition where your red blood cells are shaped like sickles. Red blood cells carry oxygen through the body. Sickle-shaped cells do not live as long as normal red blood cells. They also clump together and block blood from flowing through the blood vessels. This prevents the body from getting enough oxygen. Sickle cell anemia causes organ damage and pain. It also increases the risk of infection. Follow these instructions at home: Medicines  Take over-the-counter and prescription medicines only as told by your doctor.  If you were prescribed an antibiotic medicine, take it as told by your doctor. Do not stop taking the antibiotic even if you start to feel better.  If you develop a fever, do not take medicines to lower the fever right away. Tell your doctor about the fever. Managing pain, stiffness, and swelling  Try these methods to help with pain: ? Use a heating pad. ? Take a warm bath. ? Distract yourself, such as by watching TV. Eating and drinking  Drink enough fluid to keep your pee (urine) clear or pale yellow. Drink more in hot weather and during exercise.  Limit or avoid alcohol.  Eat a healthy diet. Eat plenty of fruits, vegetables, whole grains, and lean protein.  Take vitamins and supplements as told by your doctor. Traveling  When traveling, keep these with you: ? Your medical information. ? The names of your doctors. ? Your medicines.  If you need to take an airplane, talk to your doctor first. Activity  Rest often.  Avoid exercises that make your heart beat much faster, such as jogging. General instructions  Do not use products that have nicotine or tobacco, such as cigarettes and e-cigarettes. If you need help quitting, ask your doctor.  Consider wearing a medical alert bracelet.  Avoid being in high places (high altitudes), such as mountains.  Avoid very hot or cold temperatures.  Avoid places where the  temperature changes a lot.  Keep all follow-up visits as told by your doctor. This is important. Contact a doctor if:  A joint hurts.  Your feet or hands hurt or swell.  You feel tired (fatigued). Get help right away if:  You have symptoms of infection. These include: ? Fever. ? Chills. ? Being very tired. ? Irritability. ? Poor eating. ? Throwing up (vomiting).  You feel dizzy or faint.  You have new stomach pain, especially on the left side.  You have a an erection (priapism) that lasts more than 4 hours.  You have numbness in your arms or legs.  You have a hard time moving your arms or legs.  You have trouble talking.  You have pain that does not go away when you take medicine.  You are short of breath.  You are breathing fast.  You have a long-term cough.  You have pain in your chest.  You have a bad headache.  You have a stiff neck.  Your stomach looks bloated even though you did not eat much.  Your skin is pale.  You suddenly cannot see well. Summary  Sickle cell anemia is a condition where your red blood cells are shaped like sickles.  Follow your doctor's advice on ways to manage pain, food to eat, activities to do, and steps to take for safe travel.  Get medical help right away if you have any signs of infection, such as a fever. This information is not intended to replace advice given to you by   your health care provider. Make sure you discuss any questions you have with your health care provider. Document Revised: 08/28/2018 Document Reviewed: 06/11/2016 Elsevier Patient Education  2020 [employer]vier Inc.  

## 2020-05-01 NOTE — H&P (Signed)
[facility] History and Physical   Date: 05/01/2020  Patient name: [name] Medical record number: [id number] Date of birth: January 27, 1971 Age: 49 y.o. Gender: female PCP: [name], FNP  Attending physician: Tresa Garter, MD  Chief Complaint: Sickle cell pain  History of Present Illness: [name] is a 49 year old female with a medical history significant for sickle cell disease, chronic pain syndrome, opiate dependence and tolerance, right lower extremity ulcers, nonhealing and history of anemia of chronic disease presents complaining of left arm pain that is consistent with previous sickle cell pain crisis.  Patient says that pain intensity has been elevated over the past 24 hours and has been unrelieved by home medications.  Is status post right ankle skin graft for nonhealing ulcer.  She was started on prednisone on last Thursday and states that pain intensity increased after completing course.  She characterizes pain as intermittent and sharp.  She last had oxycodone this a.m. without sustained relief.  She denies any headache, shortness of breath, fever, chills, chest pain, urinary symptoms, nausea, vomiting, or diarrhea.  Meds: Medications Prior to Admission  Medication Sig Dispense Refill Last Dose  . Amino Acids-Protein Hydrolys (FEEDING SUPPLEMENT, PRO-STAT 64,) LIQD Take 30 mLs by mouth 3 (three) times daily with meals. 887 mL 1   . Deferasirox (JADENU) 360 MG TABS Take 1 tablet (360 mg total) by mouth daily. (Patient not taking: Reported on 12/21/2019) 90 tablet 0   . folic acid (FOLVITE) 1 MG tablet Take 5 tablets (5 mg total) by mouth daily. 150 tablet 11   . ibuprofen (ADVIL) 600 MG tablet TAKE 1 TABLET(600 MG) BY MOUTH EVERY 6 HOURS 60 tablet 2   . Oxycodone HCl 10 MG TABS Take 1 tablet (10 mg total) by mouth every 6 (six) hours as needed. 60 tablet 0   . predniSONE (DELTASONE) 5 MG tablet Take 5 mg by mouth 2 (two) times daily with a  meal. (Patient not taking: Reported on 12/21/2019)       Allergies: Patient has no known allergies. Past Medical History:  Diagnosis Date  . Anemia 03/30/2012   Hx of sickle cell disease  . CAP (community acquired pneumonia)    2014  . Cholelithiasis 03/30/2012  . Chronic wound of extremity 04/01/2012  . Elevated LFTs   . Leg ulcer ([place]) 10/27/2012   Chronic under care of wound clinic  . Multiple open wounds of lower extremity    chronic wounds B/LLE  . Sickle cell disease ([place])   . Sinus bradycardia by electrocardiogram 04/03/2012   Past Surgical History:  Procedure Laterality Date  . ALLOGRAFT APPLICATION Right 4/81/8563   Procedure: SURGICAL PREP FOR GRAFTING RIGHT LOWER EXTREMITY AND APPLICATION OF Jannifer Hick;  Surgeon: Irene Limbo, MD;  Location: [place];  Service: Plastics;  Laterality: Right;  . APPLICATION OF A-CELL OF EXTREMITY Right 10/09/2015   Procedure: APPLICATION OF Jannifer Hick;  Surgeon: Irene Limbo, MD;  Location: [place];  Service: Plastics;  Laterality: Right;  . BREAST SURGERY     left breast cyst aspiration  . CESAREAN SECTION    . CESAREAN SECTION N/A 01/06/2014   Procedure: CESAREAN SECTION;  Surgeon: Emily Filbert, MD;  Location: [facility];  Service: Obstetrics;  Laterality: N/A;  . CESAREAN SECTION N/A 05/04/2017   Procedure: CESAREAN SECTION;  Surgeon: Woodroe Mode, MD;  Location: [place];  Service: Obstetrics;  Laterality: N/A;  . I & D EXTREMITY Right 10/09/2015  Procedure: SURGICAL PREPARATION FOR GRAFTING RIGHT ANKLE AND APPLICATION THERASKIN;  Surgeon: Irene Limbo, MD;  Location: [place];  Service: Plastics;  Laterality: Right;  . SKIN FULL THICKNESS GRAFT Bilateral 06/17/2016   Procedure: SURGICAL PREP FOR GRAFTING, BILATERAL LOWER EXTREMITIES AND APPLICATION OF Jannifer Hick;  Surgeon: Irene Limbo, MD;  Location: [place];  Service: Plastics;  Laterality: Bilateral;  .  TONSILLECTOMY     Family History  Problem Relation Age of Onset  . Sickle cell anemia Sister   . Diabetes Mother   . Asthma Mother   . Sickle cell trait Mother   . Sickle cell trait Father   . Hypertension Father    Social History   Socioeconomic History  . Marital status: Married    Spouse name: [name]  . Number of children: 1  . Years of education: Not on file  . Highest education level: Not on file  Occupational History  . Occupation: Employed in home care.  Tobacco Use  . Smoking status: Never Smoker  . Smokeless tobacco: Never Used  Vaping Use  . Vaping Use: Never used  Substance and Sexual Activity  . Alcohol use: No  . Drug use: No  . Sexual activity: Yes    Birth control/protection: None  Other Topics Concern  . Not on file  Social History Narrative   Lives with husband.   Social Determinants of Health   Financial Resource Strain: Not on file  Food Insecurity: Not on file  Transportation Needs: Not on file  Physical Activity: Not on file  Stress: Not on file  Social Connections: Not on file  Intimate Partner Violence: Not on file   Review of Systems  HENT: Negative.   Respiratory: Negative.   Cardiovascular: Negative.   Genitourinary: Negative.   Musculoskeletal: Positive for back pain.  Skin: Negative.   Neurological: Negative.   Psychiatric/Behavioral: Negative.     Physical Exam: unknown if currently breastfeeding. Physical Exam Constitutional:      Appearance: Normal appearance.  Eyes:     Pupils: Pupils are equal, round, and reactive to light.  Cardiovascular:     Rate and Rhythm: Normal rate and regular rhythm.  Pulmonary:     Effort: Pulmonary effort is normal.  Neurological:     General: No focal deficit present.     Mental Status: She is alert. Mental status is at baseline.     Lab results: No results found for this or any previous visit (from the past 24 hour(s)).  Imaging results:  No results  found.   Assessment & Plan: Patient admitted to sickle cell day infusion center for management of pain crisis.  Patient is opiate tolerant Initiate IV dilaudid PCA. Settings of 0.5 mg, 10 minute lockout and 3 mg/hr IV fluids, D5.45% saline at 125 ml/hr Toradol 15 mg IV times one dose Tylenol 1000 mg by mouth times one dose Review CBC with differential, complete metabolic panel, and reticulocytes as results become available. Pain intensity will be reevaluated in context of functioning and relationship to baseline as care progresses If pain intensity remains elevated and/or sudden change in hemodynamic stability transition to inpatient services for higher level of care.    [name]  APRN, MSN, FNP-C Patient [facility] [address] [place], [place] [address] [phone]   05/01/2020, 10:58 AM

## 2020-05-01 NOTE — Progress Notes (Signed)
CRITICAL VALUE ALERT  Critical Value:  Hemoglobin 6.1  Date & Time Notified:  05/01/2020, 11:59 am  Provider Notified: [name], FNP,   Orders Received/Actions taken: Will be transfused with packed red blood cells

## 2020-05-02 ENCOUNTER — Non-Acute Institutional Stay (HOSPITAL_COMMUNITY)
Admission: AD | Admit: 2020-05-02 | Discharge: 2020-05-02 | Disposition: A | Discharge status: Home / Self Care | Payer: Medicaid Other | Source: Ambulatory Visit | Attending: Internal Medicine | Admitting: Internal Medicine

## 2020-05-02 DIAGNOSIS — G894 Chronic pain syndrome: Secondary | ICD-10-CM | POA: Insufficient documentation

## 2020-05-02 DIAGNOSIS — Z79899 Other long term (current) drug therapy: Secondary | ICD-10-CM | POA: Insufficient documentation

## 2020-05-02 DIAGNOSIS — F1120 Opioid dependence, uncomplicated: Secondary | ICD-10-CM | POA: Diagnosis not present

## 2020-05-02 DIAGNOSIS — D5700 Hb-SS disease with crisis, unspecified: Secondary | ICD-10-CM | POA: Diagnosis present

## 2020-05-02 DIAGNOSIS — D638 Anemia in other chronic diseases classified elsewhere: Secondary | ICD-10-CM | POA: Insufficient documentation

## 2020-05-02 LAB — CBC
HCT: 19.4 % — ABNORMAL LOW (ref 36.0–46.0)
Hemoglobin: 6.4 g/dL — CL (ref 12.0–15.0)
MCH: 31.4 pg (ref 26.0–34.0)
MCHC: 33 g/dL (ref 30.0–36.0)
MCV: 95.1 fL (ref 80.0–100.0)
Platelets: 464 10*3/uL — ABNORMAL HIGH (ref 150–400)
RBC: 2.04 MIL/uL — ABNORMAL LOW (ref 3.87–5.11)
RDW: 23.9 % — ABNORMAL HIGH (ref 11.5–15.5)
WBC: 21.4 10*3/uL — ABNORMAL HIGH (ref 4.0–10.5)
nRBC: 29 % — ABNORMAL HIGH (ref 0.0–0.2)

## 2020-05-02 LAB — HEMOGLOBIN AND HEMATOCRIT, BLOOD
HCT: 22.1 % — ABNORMAL LOW (ref 36.0–46.0)
Hemoglobin: 7.5 g/dL — ABNORMAL LOW (ref 12.0–15.0)

## 2020-05-02 LAB — PREPARE RBC (CROSSMATCH)

## 2020-05-02 MED ORDER — KETOROLAC TROMETHAMINE 30 MG/ML IJ SOLN
15.0000 mg | Freq: Once | INTRAMUSCULAR | Status: AC
  Administered 2020-05-02: 12:00:00 15 mg via INTRAVENOUS
  Filled 2020-05-02: qty 1

## 2020-05-02 MED ORDER — HYDROMORPHONE 1 MG/ML IV SOLN
INTRAVENOUS | Status: DC
  Administered 2020-05-02: 11.5 mg via INTRAVENOUS
  Administered 2020-05-02: 30 mg via INTRAVENOUS
  Filled 2020-05-02: qty 30

## 2020-05-02 MED ORDER — DIPHENHYDRAMINE HCL 25 MG PO CAPS
25.0000 mg | ORAL_CAPSULE | Freq: Once | ORAL | Status: AC
  Administered 2020-05-02: 12:00:00 25 mg via ORAL
  Filled 2020-05-02 (×2): qty 1

## 2020-05-02 MED ORDER — ACETAMINOPHEN 325 MG PO TABS
650.0000 mg | ORAL_TABLET | Freq: Once | ORAL | Status: AC
Start: 2020-05-02 — End: 2020-05-02
  Administered 2020-05-02: 12:00:00 650 mg via ORAL
  Filled 2020-05-02 (×2): qty 2

## 2020-05-02 MED ORDER — NALOXONE HCL 0.4 MG/ML IJ SOLN: 0.4000 mg | INTRAMUSCULAR | Status: DC | PRN

## 2020-05-02 MED ORDER — SODIUM CHLORIDE 0.9% IV SOLUTION: Freq: Once | INTRAVENOUS | Status: AC

## 2020-05-02 MED ORDER — SODIUM CHLORIDE 0.9% FLUSH: 9.0000 mL | INTRAVENOUS | Status: DC | PRN

## 2020-05-02 MED ORDER — ONDANSETRON HCL 4 MG/2ML IJ SOLN: 4.0000 mg | Freq: Four times a day (QID) | INTRAMUSCULAR | Status: DC | PRN

## 2020-05-02 NOTE — H&P (Signed)
[facility] History and Physical   Date: 05/02/2020  Patient name: [name] Medical record number: [id number] Date of birth: [birth year] Age: 49 y.o. Gender: female PCP: [name], FNP  Attending physician: Tresa Garter, MD  Chief Complaint: Sickle cell pain   History of Present Illness: [name] is a 49 year old female with a medical history significant for sickle cell disease, chronic pain syndrome, opiate dependence and tolerance, right lower extremity ulcers, nonhealing and history of anemia of chronic disease presents complaining of left arm pain that is consistent with previous sickle cell pain crisis.  Patient says that pain intensity has been elevated over the past several days.  Patient was treated and evaluated at sickle cell day clinic on 05/01/2020 for pain crisis.  At that time, hemoglobin was 6.1 patient has S/P 1 unit PRBCs.  Prior to discharge, patient's hemoglobin increased to 6.8.  Advised to return on today for additional unit of PRBCs.  Patient continues to have pain primarily to left upper extremity.  Pain has been unrelieved by home medications.  She last had oxycodone this a.m. without sustained relief.  Her pain intensity is 9/10 characterized as constant and throbbing.  She denies any headache, shortness of breath, fever, chills, chest pain, urinary symptoms, nausea, vomiting, or diarrhea Meds: Medications Prior to Admission  Medication Sig Dispense Refill Last Dose  . Amino Acids-Protein Hydrolys (FEEDING SUPPLEMENT, PRO-STAT 64,) LIQD Take 30 mLs by mouth 3 (three) times daily with meals. 887 mL 1   . Deferasirox (JADENU) 360 MG TABS Take 1 tablet (360 mg total) by mouth daily. (Patient not taking: Reported on 12/21/2019) 90 tablet 0   . folic acid (FOLVITE) 1 MG tablet Take 5 tablets (5 mg total) by mouth daily. 150 tablet 11   . ibuprofen (ADVIL) 600 MG tablet TAKE 1 TABLET(600 MG) BY MOUTH EVERY 6 HOURS 60 tablet 2   .  Oxycodone HCl 10 MG TABS Take 1 tablet (10 mg total) by mouth every 6 (six) hours as needed. 60 tablet 0   . predniSONE (DELTASONE) 5 MG tablet Take 5 mg by mouth 2 (two) times daily with a meal. (Patient not taking: Reported on 12/21/2019)       Allergies: Patient has no known allergies. Past Medical History:  Diagnosis Date  . Anemia 03/30/2012   Hx of sickle cell disease  . CAP (community acquired pneumonia)    2014  . Cholelithiasis 03/30/2012  . Chronic wound of extremity 04/01/2012  . Elevated LFTs   . Leg ulcer ([place]) 10/27/2012   Chronic under care of wound clinic  . Multiple open wounds of lower extremity    chronic wounds B/LLE  . Sickle cell disease ([place])   . Sinus bradycardia by electrocardiogram 04/03/2012   Past Surgical History:  Procedure Laterality Date  . ALLOGRAFT APPLICATION Right 1/91/4782   Procedure: SURGICAL PREP FOR GRAFTING RIGHT LOWER EXTREMITY AND APPLICATION OF Jannifer Hick;  Surgeon: Irene Limbo, MD;  Location: [place];  Service: Plastics;  Laterality: Right;  . APPLICATION OF A-CELL OF EXTREMITY Right 10/09/2015   Procedure: APPLICATION OF Jannifer Hick;  Surgeon: Irene Limbo, MD;  Location: [place];  Service: Plastics;  Laterality: Right;  . BREAST SURGERY     left breast cyst aspiration  . CESAREAN SECTION    . CESAREAN SECTION N/A 01/06/2014   Procedure: CESAREAN SECTION;  Surgeon: Emily Filbert, MD;  Location: [facility];  Service: Obstetrics;  Laterality: N/A;  .  CESAREAN SECTION N/A 05/04/2017   Procedure: CESAREAN SECTION;  Surgeon: Woodroe Mode, MD;  Location: [place];  Service: Obstetrics;  Laterality: N/A;  . I & D EXTREMITY Right 10/09/2015   Procedure: SURGICAL PREPARATION FOR GRAFTING RIGHT ANKLE AND APPLICATION THERASKIN;  Surgeon: Irene Limbo, MD;  Location: [place];  Service: Plastics;  Laterality: Right;  . SKIN FULL THICKNESS GRAFT Bilateral 06/17/2016   Procedure:  SURGICAL PREP FOR GRAFTING, BILATERAL LOWER EXTREMITIES AND APPLICATION OF Jannifer Hick;  Surgeon: Irene Limbo, MD;  Location: [place];  Service: Plastics;  Laterality: Bilateral;  . TONSILLECTOMY     Family History  Problem Relation Age of Onset  . Sickle cell anemia Sister   . Diabetes Mother   . Asthma Mother   . Sickle cell trait Mother   . Sickle cell trait Father   . Hypertension Father    Social History   Socioeconomic History  . Marital status: Married    Spouse name: [name]  . Number of children: 1  . Years of education: Not on file  . Highest education level: Not on file  Occupational History  . Occupation: Employed in home care.  Tobacco Use  . Smoking status: Never Smoker  . Smokeless tobacco: Never Used  Vaping Use  . Vaping Use: Never used  Substance and Sexual Activity  . Alcohol use: No  . Drug use: No  . Sexual activity: Yes    Birth control/protection: None  Other Topics Concern  . Not on file  Social History Narrative   Lives with husband.   Social Determinants of Health   Financial Resource Strain: Not on file  Food Insecurity: Not on file  Transportation Needs: Not on file  Physical Activity: Not on file  Stress: Not on file  Social Connections: Not on file  Intimate Partner Violence: Not on file  Review of Systems  Constitutional: Negative for chills and fever.  HENT: Negative.   Eyes: Negative.   Respiratory: Negative.   Cardiovascular: Negative.   Gastrointestinal: Negative.   Genitourinary: Negative.   Musculoskeletal: Positive for joint pain.  Skin: Negative.   Neurological: Negative.   Endo/Heme/Allergies: Negative.   Psychiatric/Behavioral: Negative.     Physical Exam: unknown if currently breastfeeding. Physical Exam Eyes:     General: Scleral icterus present.     Pupils: Pupils are equal, round, and reactive to light.  Cardiovascular:     Rate and Rhythm: Normal rate and regular rhythm.     Pulses: Normal  pulses.  Pulmonary:     Effort: Pulmonary effort is normal.  Abdominal:     General: Abdomen is flat. Bowel sounds are normal.  Musculoskeletal:        General: Normal range of motion.  Skin:    General: Skin is warm.     Comments: Right ankle dressing clean, dry, and intact  Neurological:     Mental Status: She is alert.  Psychiatric:        Mood and Affect: Mood normal.        Behavior: Behavior normal.        Thought Content: Thought content normal.        Judgment: Judgment normal.      Lab results: Results for orders placed or performed during the hospital encounter of 05/01/20 (from the past 24 hour(s))  CBC with Differential/Platelet     Status: Abnormal   Collection Time: 05/01/20 10:57 AM  Result Value Ref Range   WBC  20.9 (H) 4.0 - 10.5 K/uL   RBC 1.90 (L) 3.87 - 5.11 MIL/uL   Hemoglobin 6.1 (LL) 12.0 - 15.0 g/dL   HCT 17.8 (L) 36.0 - 46.0 %   MCV 93.7 80.0 - 100.0 fL   MCH 32.1 26.0 - 34.0 pg   MCHC 34.3 30.0 - 36.0 g/dL   RDW 26.3 (H) 11.5 - 15.5 %   Platelets 468 (H) 150 - 400 K/uL   nRBC 40.4 (H) 0.0 - 0.2 %   Neutrophils Relative % 73 %   Neutro Abs 15.3 (H) 1.7 - 7.7 K/uL   Lymphocytes Relative 12 %   Lymphs Abs 2.5 0.7 - 4.0 K/uL   Monocytes Relative 13 %   Monocytes Absolute 2.6 (H) 0.1 - 1.0 K/uL   Eosinophils Relative 0 %   Eosinophils Absolute 0.1 0.0 - 0.5 K/uL   Basophils Relative 0 %   Basophils Absolute 0.1 0.0 - 0.1 K/uL   Immature Granulocytes 2 %   Abs Immature Granulocytes 0.31 (H) 0.00 - 0.07 K/uL   Tammy Sours Bodies PRESENT    Polychromasia PRESENT    Sickle Cells MARKED    Target Cells PRESENT   Comprehensive metabolic panel     Status: Abnormal   Collection Time: 05/01/20 10:57 AM  Result Value Ref Range   Sodium 135 135 - 145 mmol/L   Potassium 3.5 3.5 - 5.1 mmol/L   Chloride 103 98 - 111 mmol/L   CO2 21 (L) 22 - 32 mmol/L   Glucose, Bld 103 (H) 70 - 99 mg/dL   BUN 12 6 - 20 mg/dL   Creatinine, Ser 0.44 0.44 - 1.00 mg/dL    Calcium 9.0 8.9 - 10.3 mg/dL   Total Protein 8.6 (H) 6.5 - 8.1 g/dL   Albumin 4.4 3.5 - 5.0 g/dL   AST 26 15 - 41 U/L   ALT 18 0 - 44 U/L   Alkaline Phosphatase 68 38 - 126 U/L   Total Bilirubin 5.9 (H) 0.3 - 1.2 mg/dL   GFR, Estimated >60 >60 mL/min   Anion gap 11 5 - 15  Reticulocytes     Status: Abnormal   Collection Time: 05/01/20 10:57 AM  Result Value Ref Range   Retic Ct Pct 21.6 (H) 0.4 - 3.1 %   RBC. 1.91 (L) 3.87 - 5.11 MIL/uL   Retic Count, Absolute 412.8 (H) 19.0 - 186.0 K/uL   Immature Retic Fract 54.5 (H) 2.3 - 15.9 %  Pregnancy, urine     Status: None   Collection Time: 05/01/20 10:57 AM  Result Value Ref Range   Preg Test, Ur NEGATIVE NEGATIVE  Type and screen [place]     Status: None   Collection Time: 05/01/20 11:00 AM  Result Value Ref Range   ABO/RH(D) B POS    Antibody Screen NEG    Sample Expiration 05/04/2020,2359    Unit Number [id number]    Blood Component Type RED CELLS,LR    Unit division 00    Status of Unit ISSUED,FINAL    Donor AG Type      NEGATIVE FOR C ANTIGEN NEGATIVE FOR E ANTIGEN NEGATIVE FOR KELL ANTIGEN   Transfusion Status OK TO TRANSFUSE    Crossmatch Result      Compatible Performed at [facility], [place] [address] [place], [place] [address]   Prepare RBC (crossmatch)     Status: None   Collection Time: 05/01/20 11:00 AM  Result Value Ref Range  Order Confirmation      ORDER PROCESSED BY BLOOD BANK Performed at [facility], [place] [address], [place], [place] [address]   CBC     Status: Abnormal   Collection Time: 05/01/20  5:15 PM  Result Value Ref Range   WBC 17.3 (H) 4.0 - 10.5 K/uL   RBC 2.12 (L) 3.87 - 5.11 MIL/uL   Hemoglobin 6.8 (LL) 12.0 - 15.0 g/dL   HCT 19.7 (L) 36.0 - 46.0 %   MCV 92.9 80.0 - 100.0 fL   MCH 32.1 26.0 - 34.0 pg   MCHC 34.5 30.0 - 36.0 g/dL   RDW 22.2 (H) 11.5 - 15.5 %   Platelets 384 150 - 400 K/uL   nRBC 37.9 (H) 0.0 - 0.2 %     Imaging results:  No results found.   Assessment & Plan: Sickle cell disease with pain crisis Patient admitted to sickle cell day infusion center for management of pain crisis.  Patient is opiate tolerant Initiate IV dilaudid PCA. Settings of 0.5 mg, 10 minute lockout, and 3 mg/hr Toradol 15 mg IV times one dose Tylenol 1000 mg by mouth times one dose Review CBC with differential and CMP as results become available. Pain intensity will be reevaluated in context of functioning and relationship to baseline as care progresses If pain intensity remains elevated and/or sudden change in hemodynamic stability transition to inpatient services for higher level of care.   Sickle cell anemia:  Post transfusion hemoglobin was 6.8 on 05/01/2020.  Will transfuse 1 unit PRBCs today.    [name]  APRN, MSN, FNP-C [facility] [address] [place], [place] [address] [phone]   05/02/2020, 10:38 AM

## 2020-05-02 NOTE — Discharge Summary (Signed)
[facility] Discharge Summary   Patient ID: [name] MRN: [id number] DOB/AGE: [birth year] 49 y.o.  Admit date: 05/02/2020 Discharge date: 05/02/2020  Primary Care Physician:  [name], FNP  Admission Diagnoses:  Principal Problem:   Hb-SS disease with vaso-occlusive crisis [facility])   Discharge Medications:  Allergies as of 05/02/2020   No Known Allergies     Medication List    TAKE these medications   Deferasirox 360 MG Tabs Commonly known as: Jadenu Take 1 tablet (360 mg total) by mouth daily.   feeding supplement (PRO-STAT 64) Liqd Take 30 mLs by mouth 3 (three) times daily with meals.   folic acid 1 MG tablet Commonly known as: FOLVITE Take 5 tablets (5 mg total) by mouth daily.   ibuprofen 600 MG tablet Commonly known as: ADVIL TAKE 1 TABLET(600 MG) BY MOUTH EVERY 6 HOURS   Oxycodone HCl 10 MG Tabs Take 1 tablet (10 mg total) by mouth every 6 (six) hours as needed.   predniSONE 5 MG tablet Commonly known as: DELTASONE Take 5 mg by mouth 2 (two) times daily with a meal.        Consults:  None  Significant Diagnostic Studies:  No results found.  History of present illness:  [name] is a 49 year old female with a medical history significant for sickle cell disease, chronic pain syndrome, opiate dependence and tolerance, right lower extremity ulcers, nonhealing and history of anemia of chronic disease presents complaining of left arm pain that is consistent with previous sickle cell pain crisis.  Patient says that pain intensity has been elevated over the past several days.  Patient was treated and evaluated at sickle cell day clinic on 05/01/2020 for pain crisis.  At that time, hemoglobin was 6.1 patient has S/P 1 unit PRBCs.  Prior to discharge, patient's hemoglobin increased to 6.8.  Advised to return on today for additional unit of PRBCs.  Patient continues to have pain primarily to left upper extremity.  Pain has been  unrelieved by home medications.  She last had oxycodone this a.m. without sustained relief.  Her pain intensity is 9/10 characterized as constant and throbbing.  She denies any headache, shortness of breath, fever, chills, chest pain, urinary symptoms, nausea, vomiting, or diarrhea  [facility] Course: Sickle cell disease with pain crisis: Patient admitted to sickle cell day infusion center for pain management and extended observation. Pain managed with IV Dilaudid PCA with settings of 0.5 mg, 10-minute lockout, and 3 mg/h. Toradol 15 mg IV x1 Tylenol 1000 mg x 1 Pain intensity decreased to 4/10.  Patient is requesting discharge home.  She is alert, oriented, and ambulating without assistance.  Patient will discharge home in a hemodynamically stable condition.  Sickle cell anemia: Posttransfusion hemoglobin was 6.8.  Patient was advised to return to clinic on today to receive an additional unit of PRBCs.  Patient transfused 1 unit PRBCs without complication.  Post transfusion H&H pending.  We will follow-up with patient in clinic in 1 week  Physical Exam at Discharge:  BP 118/68 (BP Location: Right Arm)   Pulse 75   Temp 97.7 F (36.5 C) (Oral)   Resp 19   SpO2 98%   Physical Exam Constitutional:      Appearance: Normal appearance.  Eyes:     General: Scleral icterus present.     Pupils: Pupils are equal, round, and reactive to light.  Cardiovascular:     Rate and Rhythm: Normal rate and regular  rhythm.  Pulmonary:     Effort: Pulmonary effort is normal.  Abdominal:     General: Bowel sounds are normal.  Skin:    General: Skin is warm.  Neurological:     General: No focal deficit present.     Mental Status: She is alert. Mental status is at baseline.  Psychiatric:        Mood and Affect: Mood normal.        Thought Content: Thought content normal.        Judgment: Judgment normal.      Disposition at Discharge: Discharge disposition: 01-Home or Self  Care       Discharge Orders: Discharge Instructions    Discharge patient   Complete by: As directed    Discharge disposition: 01-Home or Self Care   Discharge patient date: 05/02/2020      Condition at Discharge:   Stable  Time spent on Discharge:  Greater than 30 minutes.  Signed: [name]  APRN, MSN, FNP-C [facility] [address] [place],  [address] [phone]  05/02/2020, 4:20 PM

## 2020-05-02 NOTE — Progress Notes (Signed)
Patient admitted the day infusion hospital for sickle cell pain crisis and blood transfusion. Initially, patient reported left arm pain rated 8/10. Patient was treated in the day hospital yesterday where she received pain medication and 1 unit of PRBC's. Patient's post-transfusion hemoglobin was 6.8 so patient returned today for 1 more unit of blood.  For pain management, patient placed on Dilaudid PCA, given Toradol and hydrated with IV fluids. Patient's pain decreased to 4/10. Patient received 1 unit PRBC and tolerated transfusion well with no adverse reaction. Post-transfusion H&H drawn and hemoglobin increased to 7.5. Vital signs stable. AVS offered but patient refused. Patient alert, oriented and ambulatory at discharge.

## 2020-05-17 ENCOUNTER — Telehealth (HOSPITAL_COMMUNITY): Payer: Self-pay | Admitting: *Deleted

## 2020-05-17 ENCOUNTER — Non-Acute Institutional Stay (HOSPITAL_COMMUNITY)
Admission: AD | Admit: 2020-05-17 | Discharge: 2020-05-17 | Disposition: A | Discharge status: Home / Self Care | Payer: Medicaid Other | Source: Ambulatory Visit | Attending: Internal Medicine | Admitting: Internal Medicine

## 2020-05-17 ENCOUNTER — Encounter (HOSPITAL_COMMUNITY): Payer: Self-pay | Admitting: Family Medicine

## 2020-05-17 DIAGNOSIS — D638 Anemia in other chronic diseases classified elsewhere: Secondary | ICD-10-CM | POA: Insufficient documentation

## 2020-05-17 DIAGNOSIS — F1120 Opioid dependence, uncomplicated: Secondary | ICD-10-CM | POA: Insufficient documentation

## 2020-05-17 DIAGNOSIS — D5700 Hb-SS disease with crisis, unspecified: Secondary | ICD-10-CM | POA: Diagnosis present

## 2020-05-17 DIAGNOSIS — G894 Chronic pain syndrome: Secondary | ICD-10-CM | POA: Diagnosis not present

## 2020-05-17 DIAGNOSIS — Z832 Family history of diseases of the blood and blood-forming organs and certain disorders involving the immune mechanism: Secondary | ICD-10-CM | POA: Diagnosis not present

## 2020-05-17 DIAGNOSIS — M79601 Pain in right arm: Secondary | ICD-10-CM | POA: Insufficient documentation

## 2020-05-17 LAB — CBC WITH DIFFERENTIAL/PLATELET
Abs Immature Granulocytes: 0 10*3/uL (ref 0.00–0.07)
Basophils Absolute: 0.5 10*3/uL — ABNORMAL HIGH (ref 0.0–0.1)
Basophils Relative: 3 %
Eosinophils Absolute: 0.7 10*3/uL — ABNORMAL HIGH (ref 0.0–0.5)
Eosinophils Relative: 4 %
HCT: 21.9 % — ABNORMAL LOW (ref 36.0–46.0)
Hemoglobin: 7 g/dL — ABNORMAL LOW (ref 12.0–15.0)
Lymphocytes Relative: 32 %
Lymphs Abs: 5.3 10*3/uL — ABNORMAL HIGH (ref 0.7–4.0)
MCH: 30.2 pg (ref 26.0–34.0)
MCHC: 32 g/dL (ref 30.0–36.0)
MCV: 94.4 fL (ref 80.0–100.0)
Monocytes Absolute: 2.5 10*3/uL — ABNORMAL HIGH (ref 0.1–1.0)
Monocytes Relative: 15 %
Neutro Abs: 7.6 10*3/uL (ref 1.7–7.7)
Neutrophils Relative %: 46 %
Platelets: 636 10*3/uL — ABNORMAL HIGH (ref 150–400)
RBC: 2.32 MIL/uL — ABNORMAL LOW (ref 3.87–5.11)
RDW: 26.6 % — ABNORMAL HIGH (ref 11.5–15.5)
WBC: 16.5 10*3/uL — ABNORMAL HIGH (ref 4.0–10.5)
nRBC: 45 % — ABNORMAL HIGH (ref 0.0–0.2)
nRBC: 89 /100 WBC — ABNORMAL HIGH

## 2020-05-17 LAB — COMPREHENSIVE METABOLIC PANEL
ALT: 21 U/L (ref 0–44)
AST: 28 U/L (ref 15–41)
Albumin: 4.3 g/dL (ref 3.5–5.0)
Alkaline Phosphatase: 86 U/L (ref 38–126)
Anion gap: 10 (ref 5–15)
BUN: 6 mg/dL (ref 6–20)
CO2: 24 mmol/L (ref 22–32)
Calcium: 9.1 mg/dL (ref 8.9–10.3)
Chloride: 106 mmol/L (ref 98–111)
Creatinine, Ser: 0.39 mg/dL — ABNORMAL LOW (ref 0.44–1.00)
GFR, Estimated: >60 mL/min (ref >60)
Glucose, Bld: 101 mg/dL — ABNORMAL HIGH (ref 70–99)
Potassium: 3.3 mmol/L — ABNORMAL LOW (ref 3.5–5.1)
Sodium: 140 mmol/L (ref 135–145)
Total Bilirubin: 2.9 mg/dL — ABNORMAL HIGH (ref 0.3–1.2)
Total Protein: 7.8 g/dL (ref 6.5–8.1)

## 2020-05-17 LAB — RETICULOCYTES
Immature Retic Fract: 47.2 % — ABNORMAL HIGH (ref 2.3–15.9)
RBC.: 2.32 MIL/uL — ABNORMAL LOW (ref 3.87–5.11)
Retic Count, Absolute: 583 10*3/uL — ABNORMAL HIGH (ref 19.0–186.0)
Retic Ct Pct: 24.3 % — ABNORMAL HIGH (ref 0.4–3.1)

## 2020-05-17 MED ORDER — ACETAMINOPHEN 500 MG PO TABS
1000.0000 mg | ORAL_TABLET | Freq: Once | ORAL | Status: AC
  Administered 2020-05-17: 11:00:00 1000 mg via ORAL
  Filled 2020-05-17: qty 2

## 2020-05-17 MED ORDER — ONDANSETRON HCL 4 MG/2ML IJ SOLN: 4.0000 mg | Freq: Four times a day (QID) | INTRAMUSCULAR | Status: DC | PRN

## 2020-05-17 MED ORDER — OXYCODONE HCL 10 MG PO TABS: 10.0000 mg | ORAL_TABLET | Freq: Four times a day (QID) | ORAL | 0 refills | Status: DC | PRN

## 2020-05-17 MED ORDER — HYDROMORPHONE 1 MG/ML IV SOLN
INTRAVENOUS | Status: DC
Start: 2020-05-17 — End: 2020-05-17
  Administered 2020-05-17: 13.5 mg via INTRAVENOUS
  Administered 2020-05-17: 30 mg via INTRAVENOUS
  Filled 2020-05-17: qty 30

## 2020-05-17 MED ORDER — KETOROLAC TROMETHAMINE 30 MG/ML IJ SOLN
15.0000 mg | Freq: Once | INTRAMUSCULAR | Status: AC
  Administered 2020-05-17: 12:00:00 15 mg via INTRAVENOUS
  Filled 2020-05-17: qty 1

## 2020-05-17 MED ORDER — SODIUM CHLORIDE 0.9% FLUSH: 9.0000 mL | INTRAVENOUS | Status: DC | PRN

## 2020-05-17 MED ORDER — NALOXONE HCL 0.4 MG/ML IJ SOLN: 0.4000 mg | INTRAMUSCULAR | Status: DC | PRN

## 2020-05-17 MED ORDER — SODIUM CHLORIDE 0.9 % IV SOLN
25.0000 mg | INTRAVENOUS | Status: DC | PRN
  Filled 2020-05-17: qty 0.5

## 2020-05-17 MED ORDER — DIPHENHYDRAMINE HCL 25 MG PO CAPS: 25.0000 mg | ORAL_CAPSULE | ORAL | Status: DC | PRN

## 2020-05-17 MED ORDER — SODIUM CHLORIDE 0.45 % IV SOLN: INTRAVENOUS | Status: DC

## 2020-05-17 NOTE — Discharge Instructions (Signed)
Sickle Cell Anemia, Adult  Sickle cell anemia is a condition where your red blood cells are shaped like sickles. Red blood cells carry oxygen through the body. Sickle-shaped cells do not live as long as normal red blood cells. They also clump together and block blood from flowing through the blood vessels. This prevents the body from getting enough oxygen. Sickle cell anemia causes organ damage and pain. It also increases the risk of infection. Follow these instructions at home: Medicines  Take over-the-counter and prescription medicines only as told by your doctor.  If you were prescribed an antibiotic medicine, take it as told by your doctor. Do not stop taking the antibiotic even if you start to feel better.  If you develop a fever, do not take medicines to lower the fever right away. Tell your doctor about the fever. Managing pain, stiffness, and swelling  Try these methods to help with pain: ? Use a heating pad. ? Take a warm bath. ? Distract yourself, such as by watching TV. Eating and drinking  Drink enough fluid to keep your pee (urine) clear or pale yellow. Drink more in hot weather and during exercise.  Limit or avoid alcohol.  Eat a healthy diet. Eat plenty of fruits, vegetables, whole grains, and lean protein.  Take vitamins and supplements as told by your doctor. Traveling  When traveling, keep these with you: ? Your medical information. ? The names of your doctors. ? Your medicines.  If you need to take an airplane, talk to your doctor first. Activity  Rest often.  Avoid exercises that make your heart beat much faster, such as jogging. General instructions  Do not use products that have nicotine or tobacco, such as cigarettes and e-cigarettes. If you need help quitting, ask your doctor.  Consider wearing a medical alert bracelet.  Avoid being in high places (high altitudes), such as mountains.  Avoid very hot or cold temperatures.  Avoid places where the  temperature changes a lot.  Keep all follow-up visits as told by your doctor. This is important. Contact a doctor if:  A joint hurts.  Your feet or hands hurt or swell.  You feel tired (fatigued). Get help right away if:  You have symptoms of infection. These include: ? Fever. ? Chills. ? Being very tired. ? Irritability. ? Poor eating. ? Throwing up (vomiting).  You feel dizzy or faint.  You have new stomach pain, especially on the left side.  You have a an erection (priapism) that lasts more than 4 hours.  You have numbness in your arms or legs.  You have a hard time moving your arms or legs.  You have trouble talking.  You have pain that does not go away when you take medicine.  You are short of breath.  You are breathing fast.  You have a long-term cough.  You have pain in your chest.  You have a bad headache.  You have a stiff neck.  Your stomach looks bloated even though you did not eat much.  Your skin is pale.  You suddenly cannot see well. Summary  Sickle cell anemia is a condition where your red blood cells are shaped like sickles.  Follow your doctor's advice on ways to manage pain, food to eat, activities to do, and steps to take for safe travel.  Get medical help right away if you have any signs of infection, such as a fever. This information is not intended to replace advice given to you by   your health care provider. Make sure you discuss any questions you have with your health care provider. Document Revised: 08/28/2018 Document Reviewed: 06/11/2016 Elsevier Patient Education  2020 [employer]vier Inc.  

## 2020-05-17 NOTE — Telephone Encounter (Signed)
Patient called requesting to come to the day hospital for sickle cell pain. Patient reports right hand and elbow pain rated 10/10. Reports taking Oxycodone at 5:00 am. COVID-19 screening done and patient denies all symptoms and exposures. Denies fever, chest pain, nausea, vomiting, diarrhea and abdominal pain. Admits to having transportation without driving self. [place], FNP notified. Patient can come to the day hospital for pain management. Patient advised and expresses an understanding.

## 2020-05-17 NOTE — H&P (Signed)
[facility] History and Physical   Date: 05/17/2020  Patient name: [name] Medical record number: [id number] Date of birth: 07-May-1971 Age: 49 y.o. Gender: female PCP: [name], FNP  Attending physician: Tresa Garter, MD  Chief Complaint: Sickle cell pain   History of Present Illness: [name] is a 49 year old female with a medical history significant for sickle cell disease, chronic pain syndrome, opiate dependence and tolerance, right lower extremity ulcers, nonhealing, and history of anemia of chronic disease presents complaining of right arm pain that is consistent with previous sickle cell pain crisis.  Patient says that she awakened 2 nights ago with constant, throbbing pain to right arm.  Pain has been unrelieved by home medications.  Patient last had oxycodone and ibuprofen this a.m. without sustained relief.  Patient rates pain as 10/10.  She denies any subjective fever, chills, headache, urinary symptoms, nausea, vomiting, or diarrhea. Meds: Medications Prior to Admission  Medication Sig Dispense Refill Last Dose  . Amino Acids-Protein Hydrolys (FEEDING SUPPLEMENT, PRO-STAT 64,) LIQD Take 30 mLs by mouth 3 (three) times daily with meals. 887 mL 1   . Deferasirox (JADENU) 360 MG TABS Take 1 tablet (360 mg total) by mouth daily. (Patient not taking: Reported on 12/21/2019) 90 tablet 0   . folic acid (FOLVITE) 1 MG tablet Take 5 tablets (5 mg total) by mouth daily. 150 tablet 11   . ibuprofen (ADVIL) 600 MG tablet TAKE 1 TABLET(600 MG) BY MOUTH EVERY 6 HOURS 60 tablet 2   . Oxycodone HCl 10 MG TABS Take 1 tablet (10 mg total) by mouth every 6 (six) hours as needed. 60 tablet 0   . predniSONE (DELTASONE) 5 MG tablet Take 5 mg by mouth 2 (two) times daily with a meal. (Patient not taking: Reported on 12/21/2019)       Allergies: Patient has no known allergies. Past Medical History:  Diagnosis Date  . Anemia 03/30/2012   Hx of sickle cell  disease  . CAP (community acquired pneumonia)    2014  . Cholelithiasis 03/30/2012  . Chronic wound of extremity 04/01/2012  . Elevated LFTs   . Leg ulcer ([place]) 10/27/2012   Chronic under care of wound clinic  . Multiple open wounds of lower extremity    chronic wounds B/LLE  . Sickle cell disease ([place])   . Sinus bradycardia by electrocardiogram 04/03/2012   Past Surgical History:  Procedure Laterality Date  . ALLOGRAFT APPLICATION Right [id number]   Procedure: SURGICAL PREP FOR GRAFTING RIGHT LOWER EXTREMITY AND APPLICATION OF Jannifer Hick;  Surgeon: Irene Limbo, MD;  Location: [place];  Service: Plastics;  Laterality: Right;  . APPLICATION OF A-CELL OF EXTREMITY Right 10/09/2015   Procedure: APPLICATION OF Jannifer Hick;  Surgeon: Irene Limbo, MD;  Location: [place];  Service: Plastics;  Laterality: Right;  . BREAST SURGERY     left breast cyst aspiration  . CESAREAN SECTION    . CESAREAN SECTION N/A 01/06/2014   Procedure: CESAREAN SECTION;  Surgeon: Emily Filbert, MD;  Location: [facility];  Service: Obstetrics;  Laterality: N/A;  . CESAREAN SECTION N/A 05/04/2017   Procedure: CESAREAN SECTION;  Surgeon: Woodroe Mode, MD;  Location: [place];  Service: Obstetrics;  Laterality: N/A;  . I & D EXTREMITY Right 10/09/2015   Procedure: SURGICAL PREPARATION FOR GRAFTING RIGHT ANKLE AND APPLICATION THERASKIN;  Surgeon: Irene Limbo, MD;  Location: [place];  Service: Plastics;  Laterality: Right;  .  SKIN FULL THICKNESS GRAFT Bilateral 06/17/2016   Procedure: SURGICAL PREP FOR GRAFTING, BILATERAL LOWER EXTREMITIES AND APPLICATION OF Marcellus Scott;  Surgeon: Glenna Fellows, MD;  Location: [facility];  Service: Plastics;  Laterality: Bilateral;  . TONSILLECTOMY     Family History  Problem Relation Age of Onset  . Sickle cell anemia Sister   . Diabetes Mother   . Asthma Mother   . Sickle cell trait Mother   . Sickle cell trait Father    . Hypertension Father    Social History   Socioeconomic History  . Marital status: Married    Spouse name: Arvilla Meres  . Number of children: 1  . Years of education: Not on file  . Highest education level: Not on file  Occupational History  . Occupation: Employed in home care.  Tobacco Use  . Smoking status: Never Smoker  . Smokeless tobacco: Never Used  Vaping Use  . Vaping Use: Never used  Substance and Sexual Activity  . Alcohol use: No  . Drug use: No  . Sexual activity: Yes    Birth control/protection: None  Other Topics Concern  . Not on file  Social History Narrative   Lives with husband.   Social Determinants of Health   Financial Resource Strain: Not on file  Food Insecurity: Not on file  Transportation Needs: Not on file  Physical Activity: Not on file  Stress: Not on file  Social Connections: Not on file  Intimate Partner Violence: Not on file   Review of Systems  Constitutional: Negative for chills and fever.  Eyes: Negative.   Respiratory: Negative.   Cardiovascular: Negative.   Gastrointestinal: Negative.   Genitourinary: Negative.   Musculoskeletal: Positive for back pain and joint pain.  Skin: Negative.   Neurological: Negative.   Psychiatric/Behavioral: Negative.     Physical Exam: unknown if currently breastfeeding. Physical Exam Constitutional:      Appearance: Normal appearance.  Eyes:     General: Scleral icterus present.     Pupils: Pupils are equal, round, and reactive to light.  Cardiovascular:     Rate and Rhythm: Normal rate and regular rhythm.     Pulses: Normal pulses.  Pulmonary:     Effort: Pulmonary effort is normal.  Abdominal:     General: Abdomen is flat. Bowel sounds are normal.  Musculoskeletal:        General: Normal range of motion.  Skin:    General: Skin is warm.  Neurological:     General: No focal deficit present.     Mental Status: She is alert. Mental status is at baseline.  Psychiatric:         Mood and Affect: Mood normal.        Thought Content: Thought content normal.        Judgment: Judgment normal.      Lab results: No results found for this or any previous visit (from the past 24 hour(s)).  Imaging results:  No results found.   Assessment & Plan: Patient admitted to sickle cell day infusion center for management of pain crisis.  Patient is opiate tolerant Initiate IV dilaudid PCA. Settings of 0.5 mg, 10 minute lockout and 3 mg/hr IV fluids, 0.45% saline at 150 ml/hr Toradol 15 mg IV times one dose Tylenol 1000 mg by mouth times one dose Review CBC with differential, complete metabolic panel, and reticulocytes as results become available. Pain intensity will be reevaluated in context of functioning and relationship to baseline as care progresses  If pain intensity remains elevated and/or sudden change in hemodynamic stability transition to inpatient services for higher level of care.      [name]  APRN, MSN, FNP-C [facility] [address] [place], [place] [address] [phone]  05/17/2020, 10:16 AM

## 2020-05-17 NOTE — Progress Notes (Signed)
Patient admitted to the day hospital for treatment of sickle cell pain crisis. Patient reported pain rated 10/10 in the right hand . Patient placed on Dilaudid PCA, given IV Toradol, PO tylenol and hydrated with IV fluids. At discharge patient reported  pain at 2/10. Discharge instructions given to patient. Patient to come back on 05/23/2020 for lab draws. Alert, oriented and ambulatory at discharge.

## 2020-05-17 NOTE — Discharge Summary (Signed)
[facility] Discharge Summary   Patient ID: [name] MRN: [id number] DOB/AGE: [birth year] 49 y.o.  Admit date: 05/17/2020 Discharge date: 05/17/2020  Primary Care Physician:  [name], FNP  Admission Diagnoses:  Active Problems:   Sickle cell pain crisis [facility])   Discharge Medications:  Allergies as of 05/17/2020   No Known Allergies     Medication List    TAKE these medications   Deferasirox 360 MG Tabs Commonly known as: Jadenu Take 1 tablet (360 mg total) by mouth daily.   feeding supplement (PRO-STAT 64) Liqd Take 30 mLs by mouth 3 (three) times daily with meals.   folic acid 1 MG tablet Commonly known as: FOLVITE Take 5 tablets (5 mg total) by mouth daily.   ibuprofen 600 MG tablet Commonly known as: ADVIL TAKE 1 TABLET(600 MG) BY MOUTH EVERY 6 HOURS   Oxycodone HCl 10 MG Tabs Take 1 tablet (10 mg total) by mouth every 6 (six) hours as needed.   predniSONE 5 MG tablet Commonly known as: DELTASONE Take 5 mg by mouth 2 (two) times daily with a meal.        Consults:  None  Significant Diagnostic Studies:  No results found.  History of present illness: [name] is a 49 year old female with a medical history significant for sickle cell disease, chronic pain syndrome, opiate dependence and tolerance, right lower extremity ulcers, nonhealing, and history of anemia of chronic disease presents complaining of right arm pain that is consistent with previous sickle cell pain crisis.  Patient says that she awakened 2 nights ago with constant, throbbing pain to right arm.  Pain has been unrelieved by home medications.  Patient last had oxycodone and ibuprofen this a.m. without sustained relief.  Patient rates pain as 10/10.  She denies any subjective fever, chills, headache, urinary symptoms, nausea, vomiting, or diarrhea.  [facility] Course: Patient admitted to sickle cell day infusion clinic for management of pain  crisis.  Reviewed all laboratory values. Hemoglobin is around 7.0, consistent with patient's baseline of 7.0-8.0 g/dL.  Patient advised to return to clinic on 05/22/2020 for CBC and type and screen. Potassium was 3.3, patient advised to follow-up potassium rich diet at discharge. All other laboratory values largely consistent with patient's baseline. Pain managed with IV Dilaudid PCA IV Toradol 15 mg x 1 Tylenol 1000 mg x 1 IV fluids, 0.45% saline at 150 mL/h. Pain intensity decreased to 3/10.  Patient is requesting discharge home.  Also, patient is requesting home pain medications.  Oxycodone 10 mg #90 was sent to patient's pharmacy.  Reviewed PDMP prior to prescribing opiate medications, no inconsistencies noted. Patient is alert, oriented, and ambulating without assistance.  She will discharge home in a hemodynamically stable condition.  Discharge instructions: Resume all home medications.   Follow up with PCP as previously  scheduled.   Discussed the importance of drinking 64 ounces of water daily, dehydration of red blood cells may lead further sickling.   Avoid all stressors that precipitate sickle cell pain crisis.     The patient was given clear instructions to go to ER or return to medical center if symptoms do not improve, worsen or new problems develop.    Physical Exam at Discharge:  BP 126/72 (BP Location: Left Arm)   Pulse 65   Temp 97.9 F (36.6 C) (Temporal)   Resp 12   LMP  (LMP Unknown)   SpO2 96%   Physical Exam Constitutional:  Appearance: Normal appearance.  HENT:     Right Ear: Ear canal normal.  Eyes:     General: Scleral icterus present.     Pupils: Pupils are equal, round, and reactive to light.  Cardiovascular:     Rate and Rhythm: Normal rate and regular rhythm.     Pulses: Normal pulses.  Pulmonary:     Effort: Pulmonary effort is normal.  Skin:    General: Skin is warm.  Neurological:     General: No focal deficit present.     Mental  Status: She is alert. Mental status is at baseline.  Psychiatric:        Mood and Affect: Mood normal.        Behavior: Behavior normal.        Thought Content: Thought content normal.        Judgment: Judgment normal.       Disposition at Discharge: Discharge disposition: 01-Home or Self Care       Discharge Orders: Discharge Instructions    Discharge patient   Complete by: As directed    Discharge disposition: 01-Home or Self Care   Discharge patient date: 05/17/2020      Condition at Discharge:   Stable  Time spent on Discharge:  Greater than 30 minutes.  Signed: Nolon Nations  APRN, MSN, FNP-C Patient Care [facility] [address] [place], [place] [address] [phone]  05/17/2020, 5:00 PM

## 2020-05-23 ENCOUNTER — Ambulatory Visit (HOSPITAL_COMMUNITY)
Admission: RE | Admit: 2020-05-23 | Discharge: 2020-05-23 | Disposition: A | Discharge status: Home / Self Care | Payer: Medicaid Other | Source: Ambulatory Visit | Attending: Internal Medicine | Admitting: Internal Medicine

## 2020-05-23 ENCOUNTER — Other Ambulatory Visit: Payer: Self-pay

## 2020-05-23 ENCOUNTER — Ambulatory Visit: Payer: Medicaid Other

## 2020-05-23 ENCOUNTER — Other Ambulatory Visit: Payer: Self-pay | Admitting: Family Medicine

## 2020-05-23 ENCOUNTER — Ambulatory Visit (HOSPITAL_COMMUNITY): Payer: Medicaid Other

## 2020-05-23 ENCOUNTER — Telehealth (HOSPITAL_COMMUNITY): Payer: Self-pay

## 2020-05-23 DIAGNOSIS — D5700 Hb-SS disease with crisis, unspecified: Secondary | ICD-10-CM | POA: Diagnosis present

## 2020-05-23 DIAGNOSIS — D571 Sickle-cell disease without crisis: Secondary | ICD-10-CM

## 2020-05-23 LAB — DIFFERENTIAL
Abs Immature Granulocytes: 0.13 10*3/uL — ABNORMAL HIGH (ref 0.00–0.07)
Basophils Absolute: 0.1 10*3/uL (ref 0.0–0.1)
Basophils Relative: 1 %
Eosinophils Absolute: 0.1 10*3/uL (ref 0.0–0.5)
Eosinophils Relative: 1 %
Immature Granulocytes: 1 %
Lymphocytes Relative: 18 %
Lymphs Abs: 2.7 10*3/uL (ref 0.7–4.0)
Monocytes Absolute: 2.3 10*3/uL — ABNORMAL HIGH (ref 0.1–1.0)
Monocytes Relative: 15 %
Neutro Abs: 9.8 10*3/uL — ABNORMAL HIGH (ref 1.7–7.7)
Neutrophils Relative %: 64 %

## 2020-05-23 LAB — BPAM RBC
Blood Product Expiration Date: 202112312359
Blood Product Expiration Date: 202201152359
ISSUE DATE / TIME: 202112131433
ISSUE DATE / TIME: 202112141224
Unit Type and Rh: 5100
Unit Type and Rh: 5100

## 2020-05-23 LAB — TYPE AND SCREEN
ABO/RH(D): B POS
Antibody Screen: NEGATIVE
Unit division: 0
Unit division: 0

## 2020-05-23 LAB — CBC
HCT: 16.5 % — ABNORMAL LOW (ref 36.0–46.0)
Hemoglobin: 5.8 g/dL — CL (ref 12.0–15.0)
MCH: 28.9 pg (ref 26.0–34.0)
MCHC: 35.2 g/dL (ref 30.0–36.0)
MCV: 82.1 fL (ref 80.0–100.0)
Platelets: 473 10*3/uL — ABNORMAL HIGH (ref 150–400)
RBC: 2.01 MIL/uL — ABNORMAL LOW (ref 3.87–5.11)
RDW: 22.4 % — ABNORMAL HIGH (ref 11.5–15.5)
WBC: 15.1 10*3/uL — ABNORMAL HIGH (ref 4.0–10.5)
nRBC: 11.1 % — ABNORMAL HIGH (ref 0.0–0.2)

## 2020-05-23 NOTE — Progress Notes (Signed)
CRITICAL VALUE ALERT  Critical Value: Hemoglobin 5.8 Date & Time Notied: 05/23/2020, 15:58  Provider Notified: Julianne Handler, FNP  Orders Received/Actions taken: No new orders at this time.

## 2020-05-23 NOTE — Telephone Encounter (Signed)
Pt called and notified that hemoglobin drawn today resulted at 5.8.  Pt instructed to return to day hospital on 05/24/20 at 8:00am to receive 2 units of blood and hospital admission per [place] FNP. Pt verbalized understanding.

## 2020-05-24 ENCOUNTER — Other Ambulatory Visit: Payer: Self-pay | Admitting: Family Medicine

## 2020-05-24 ENCOUNTER — Telehealth: Payer: Self-pay | Admitting: Family Medicine

## 2020-05-24 ENCOUNTER — Ambulatory Visit (HOSPITAL_COMMUNITY)
Admission: RE | Admit: 2020-05-24 | Discharge: 2020-05-24 | Disposition: A | Discharge status: Home / Self Care | Payer: Medicaid Other | Source: Ambulatory Visit | Attending: Internal Medicine | Admitting: Internal Medicine

## 2020-05-24 DIAGNOSIS — D5700 Hb-SS disease with crisis, unspecified: Secondary | ICD-10-CM | POA: Diagnosis not present

## 2020-05-24 LAB — PREPARE RBC (CROSSMATCH)

## 2020-05-24 MED ORDER — SODIUM CHLORIDE 0.9% IV SOLUTION: Freq: Once | INTRAVENOUS | Status: AC

## 2020-05-24 MED ORDER — ACETAMINOPHEN 325 MG PO TABS
650.0000 mg | ORAL_TABLET | Freq: Once | ORAL | Status: AC
  Administered 2020-05-24: 650 mg via ORAL
  Filled 2020-05-24: qty 2

## 2020-05-24 MED ORDER — DIPHENHYDRAMINE HCL 25 MG PO CAPS
25.0000 mg | ORAL_CAPSULE | Freq: Once | ORAL | Status: AC
  Administered 2020-05-24: 25 mg via ORAL
  Filled 2020-05-24: qty 1

## 2020-05-24 NOTE — Progress Notes (Signed)
Patient received  2 unit of packed red blood cells via PIV. CBC and Type and screen was done before transfusion on 05/23/2020. [place] FNP notified of critical value of Hgb 5.8. Orders placed for 2 units.  Pre transfusion medications were given. Post transfusion H&H was drawn. Tolerated well, vitals stable, discharge instructions given, verbalized understanding. Patient alert, oriented and ambulatory at the time of discharge.

## 2020-05-24 NOTE — Telephone Encounter (Signed)
error 

## 2020-05-24 NOTE — Progress Notes (Signed)
[name] is a 50 year old female with a medical history significant for sickle cell disease and anemia of chronic disease was found to have a hemoglobin of 5.8 on 05/23/2020. Will transfuse 2 units PRBCs.   Nolon Nations  APRN, MSN, FNP-C Patient Care [facility] [address] [place], [place] [address] [phone]

## 2020-05-25 LAB — BPAM RBC
Blood Product Expiration Date: 202201282359
Blood Product Expiration Date: 202202032359
ISSUE DATE / TIME: 202201051028
ISSUE DATE / TIME: 202201051028
Unit Type and Rh: 9500
Unit Type and Rh: 9500

## 2020-05-25 LAB — TYPE AND SCREEN
ABO/RH(D): B POS
Antibody Screen: NEGATIVE
Unit division: 0
Unit division: 0

## 2020-06-15 ENCOUNTER — Other Ambulatory Visit: Payer: Self-pay | Admitting: Family Medicine

## 2020-06-15 ENCOUNTER — Telehealth: Payer: Self-pay | Admitting: Family Medicine

## 2020-06-15 DIAGNOSIS — G894 Chronic pain syndrome: Secondary | ICD-10-CM

## 2020-06-15 MED ORDER — OXYCODONE HCL 10 MG PO TABS: 10.0000 mg | ORAL_TABLET | Freq: Four times a day (QID) | ORAL | 0 refills | Status: DC | PRN

## 2020-06-15 NOTE — Progress Notes (Signed)
Reviewed PDMP substance reporting system prior to prescribing opiate medications. No inconsistencies noted.   Meds ordered this encounter  Medications  . Oxycodone HCl 10 MG TABS    Sig: Take 1 tablet (10 mg total) by mouth every 6 (six) hours as needed.    Dispense:  90 tablet    Refill:  0    Order Specific Question:   Supervising Provider    Answer:   Tresa Garter [[id number]]    [name]  APRN, MSN, FNP-C Patient [name] [address] [place], [place] [address] [phone]

## 2020-06-16 NOTE — Telephone Encounter (Signed)
DOne

## 2020-07-18 ENCOUNTER — Telehealth: Payer: Self-pay

## 2020-07-18 NOTE — Telephone Encounter (Signed)
Med refill Oxycodone 10 mg  Ibprofen 600 mg

## 2020-07-19 ENCOUNTER — Other Ambulatory Visit: Payer: Self-pay | Admitting: Family Medicine

## 2020-07-19 DIAGNOSIS — G894 Chronic pain syndrome: Secondary | ICD-10-CM

## 2020-07-19 DIAGNOSIS — D571 Sickle-cell disease without crisis: Secondary | ICD-10-CM

## 2020-07-19 MED ORDER — OXYCODONE HCL 10 MG PO TABS: 10.0000 mg | ORAL_TABLET | ORAL | 0 refills | Status: DC | PRN

## 2020-07-19 MED ORDER — IBUPROFEN 600 MG PO TABS: ORAL_TABLET | ORAL | 2 refills | Status: DC

## 2020-07-19 NOTE — Progress Notes (Signed)
Reviewed PDMP substance reporting system prior to prescribing opiate medications. No inconsistencies noted.    [name] Pounds  APRN, MSN, FNP-C Patient [facility] [address] [place], [place] [address] [phone]

## 2020-07-20 ENCOUNTER — Telehealth: Payer: Self-pay

## 2020-07-20 NOTE — Telephone Encounter (Signed)
Prior auth for oxycodone was faxed.

## 2020-07-20 NOTE — Telephone Encounter (Signed)
Oxycodone med refill. Pt states that need prior authorization

## 2020-08-18 ENCOUNTER — Telehealth: Payer: Self-pay

## 2020-08-18 NOTE — Telephone Encounter (Signed)
Med refill Oxycodone 10 mg

## 2020-08-21 ENCOUNTER — Telehealth: Payer: Self-pay

## 2020-08-21 ENCOUNTER — Other Ambulatory Visit: Payer: Self-pay | Admitting: Family Medicine

## 2020-08-21 DIAGNOSIS — G894 Chronic pain syndrome: Secondary | ICD-10-CM

## 2020-08-21 MED ORDER — OXYCODONE HCL 10 MG PO TABS: 10.0000 mg | ORAL_TABLET | ORAL | 0 refills | Status: DC | PRN

## 2020-08-21 NOTE — Telephone Encounter (Signed)
Med refill oxycodone 10 mg

## 2020-08-21 NOTE — Progress Notes (Signed)
Reviewed PDMP substance reporting system prior to prescribing opiate medications. No inconsistencies noted.  Meds ordered this encounter  Medications   Oxycodone HCl 10 MG TABS    Sig: Take 1 tablet (10 mg total) by mouth every 4 (four) hours as needed.    Dispense:  90 tablet    Refill:  0    Order Specific Question:   Supervising Provider    Answer:   JEGEDE, OLUGBEMIGA E [[id number]]   [name] Wisecup Moore Daylyn Christine  APRN, MSN, FNP-C Patient Care [facility] [address]  [place], [place] [address] [phone]  

## 2020-08-24 NOTE — Progress Notes (Signed)
(  Key: [id number])  This request has received a Favorable outcome.  Please note any additional information provided by [facility] [facility] at the bottom of this request.  [id number] - PA Case ID: [id number] Need help? Call us at [phone] Outcome Approvedtoday PA Case: [id number], Status: Approved, Coverage Starts on: 08/24/2020 12:00:00 AM, Coverage Ends on: 02/20/2021 12:00:00 AM. Drug oxyCODONE HCl 10MG  tablets Form [employer] [place]

## 2020-09-18 ENCOUNTER — Telehealth: Payer: Self-pay

## 2020-09-18 NOTE — Telephone Encounter (Signed)
Med refill Oxycodone 

## 2020-09-19 ENCOUNTER — Other Ambulatory Visit: Payer: Self-pay | Admitting: Family Medicine

## 2020-09-19 DIAGNOSIS — G894 Chronic pain syndrome: Secondary | ICD-10-CM

## 2020-09-19 MED ORDER — OXYCODONE HCL 10 MG PO TABS: 10.0000 mg | ORAL_TABLET | ORAL | 0 refills | Status: DC | PRN

## 2020-09-19 NOTE — Progress Notes (Signed)
Reviewed PDMP substance reporting system prior to prescribing opiate medications. No inconsistencies noted.  Meds ordered this encounter  Medications   Oxycodone HCl 10 MG TABS    Sig: Take 1 tablet (10 mg total) by mouth every 4 (four) hours as needed.    Dispense:  90 tablet    Refill:  0    Order Specific Question:   Supervising Provider    Answer:   JEGEDE, OLUGBEMIGA E [[id number]]   [name] Moscato Moore Refoel Palladino  APRN, MSN, FNP-C Patient Care [facility] [address]  [place], [place] [address] [phone]  

## 2020-09-20 ENCOUNTER — Other Ambulatory Visit: Payer: Self-pay | Admitting: Family Medicine

## 2020-09-20 ENCOUNTER — Telehealth: Payer: Self-pay

## 2020-09-20 NOTE — Telephone Encounter (Signed)
Oxycodone brand needs to be changed so her insurance can cover it.  [facility] [address]

## 2020-09-21 ENCOUNTER — Telehealth: Payer: Self-pay

## 2020-09-21 ENCOUNTER — Other Ambulatory Visit: Payer: Self-pay | Admitting: Family Medicine

## 2020-09-21 DIAGNOSIS — G894 Chronic pain syndrome: Secondary | ICD-10-CM

## 2020-09-21 MED ORDER — OXYCODONE HCL 10 MG PO TABS: 10.0000 mg | ORAL_TABLET | ORAL | 0 refills | Status: DC | PRN

## 2020-09-21 NOTE — Telephone Encounter (Signed)
I spoke with [name] and I stated to her that she will have to check w/ her insurance about covering the prescription.

## 2020-09-21 NOTE — Progress Notes (Signed)
[facility]  [name] Pounds  APRN, MSN, FNP-C Patient [facility] [address] [place], [place] [address] [phone]

## 2020-10-19 ENCOUNTER — Telehealth: Payer: Self-pay

## 2020-10-19 ENCOUNTER — Other Ambulatory Visit: Payer: Self-pay | Admitting: Internal Medicine

## 2020-10-19 DIAGNOSIS — G894 Chronic pain syndrome: Secondary | ICD-10-CM

## 2020-10-19 DIAGNOSIS — D571 Sickle-cell disease without crisis: Secondary | ICD-10-CM

## 2020-10-19 MED ORDER — IBUPROFEN 600 MG PO TABS
ORAL_TABLET | ORAL | 2 refills | Status: DC
Start: 2020-10-19 — End: 2021-01-10

## 2020-10-19 MED ORDER — OXYCODONE HCL 10 MG PO TABS: 10.0000 mg | ORAL_TABLET | ORAL | 0 refills | Status: DC | PRN

## 2020-10-19 NOTE — Telephone Encounter (Addendum)
Oxycodone 10 mg     Ibuprofen   [facility] resend [facility] [address] in [place]

## 2020-10-24 ENCOUNTER — Other Ambulatory Visit: Payer: Self-pay | Admitting: Family Medicine

## 2020-10-24 ENCOUNTER — Telehealth: Payer: Self-pay

## 2020-10-24 DIAGNOSIS — G894 Chronic pain syndrome: Secondary | ICD-10-CM

## 2020-10-24 MED ORDER — OXYCODONE HCL 10 MG PO TABS: 10.0000 mg | ORAL_TABLET | ORAL | 0 refills | Status: AC | PRN

## 2020-10-24 NOTE — Telephone Encounter (Signed)
Oxycodone Ibuprofen  [facility] [address] [facility] [place] to wrong location doesn't take her insurance

## 2020-10-24 NOTE — Progress Notes (Signed)
Reviewed PDMP substance reporting system prior to prescribing opiate medications. No inconsistencies noted.  Meds ordered this encounter  Medications   Oxycodone HCl 10 MG TABS    Sig: Take 1 tablet (10 mg total) by mouth every 4 (four) hours as needed for up to 15 days.    Dispense:  90 tablet    Refill:  0    Order Specific Question:   Supervising Provider    Answer:   JEGEDE, OLUGBEMIGA E [[id number]]   [name] [name] Shipp  APRN, MSN, FNP-C Patient Care [facility] [address]  [place], [place] [address] [phone]  

## 2020-11-07 ENCOUNTER — Other Ambulatory Visit: Payer: Self-pay

## 2020-11-07 ENCOUNTER — Telehealth (INDEPENDENT_AMBULATORY_CARE_PROVIDER_SITE_OTHER): Payer: Medicaid Other | Admitting: Family Medicine

## 2020-11-07 DIAGNOSIS — G894 Chronic pain syndrome: Secondary | ICD-10-CM | POA: Diagnosis not present

## 2020-11-07 DIAGNOSIS — D571 Sickle-cell disease without crisis: Secondary | ICD-10-CM | POA: Diagnosis not present

## 2020-11-07 NOTE — Progress Notes (Signed)
Ms. zanyah, lentsch are scheduled for a virtual visit with your provider today.    Just as we do with appointments in the office, we must obtain your consent to participate.  Your consent will be active for this visit and any virtual visit you may have with one of our providers in the next 365 days.    If you have a MyChart account, I can also send a copy of this consent to you electronically.  All virtual visits are billed to your insurance company just like a traditional visit in the office.  As this is a virtual visit, video technology does not allow for your provider to perform a traditional examination.  This may limit your provider's ability to fully assess your condition.  If your provider identifies any concerns that need to be evaluated in person or the need to arrange testing such as labs, EKG, etc, we will make arrangements to do so.    Although advances in technology are sophisticated, we cannot ensure that it will always work on either your end or our end.  If the connection with a video visit is poor, we may have to switch to a telephone visit.  With either a video or telephone visit, we are not always able to ensure that we have a secure connection.   I need to obtain your verbal consent now.   Are you willing to proceed with your visit today?   [name] has provided verbal consent on 11/07/2020 for a virtual visit (video or telephone). Virtual Visit via Video Note  I connected with [name] on 11/07/20 at  1:20 PM EDT by a video enabled telemedicine application and verified that I am speaking with the correct person using two identifiers.  Location: Patient: Home Provider: [place]   I discussed the limitations of evaluation and management by telemedicine and the availability of in person appointments. The patient expressed understanding and agreed to proceed.  History of Present Illness: [name] is a 50 year old female with a medical history  significant for sickle cell disease type SS, chronic pain syndrome, opiate dependence and tolerance, and history of severe anemia of chronic disease presents via video with complaints of fatigue and sickle cell pain crisis.  She has some degree of chronic pain that has been controlled with ibuprofen and oxycodone.  She says that chronic pain is primarily to low back and right lower extremity.  She says she just has not felt well over the past several days she believes that her hemoglobin is decreased.  Patient has had the symptoms in the past with severe anemia.  She denies any headache, dizziness, shortness of breath, or chest pain.  She has had no sick contacts, recent travel, or exposure to COVID-19.  Patient has been unable to follow-up in person over the past year due to childcare constraints and children restriction with COVID-19.   Past Medical History:  Diagnosis Date   Anemia 03/30/2012   Hx of sickle cell disease   CAP (community acquired pneumonia)    2014   Cholelithiasis 03/30/2012   Chronic wound of extremity 04/01/2012   Elevated LFTs    Leg ulcer ([place]) 10/27/2012   Chronic under care of wound clinic   Multiple open wounds of lower extremity    chronic wounds B/LLE   Sickle cell disease ([place])    Sinus bradycardia by electrocardiogram 04/03/2012    Social History   Socioeconomic History   Marital status: Married  Spouse name: [name]   Number of children: 1   Years of education: Not on file   Highest education level: Not on file  Occupational History   Occupation: Employed in home care.  Tobacco Use   Smoking status: Never   Smokeless tobacco: Never  Vaping Use   Vaping Use: Never used  Substance and Sexual Activity   Alcohol use: No   Drug use: No   Sexual activity: Yes    Birth control/protection: None  Other Topics Concern   Not on file  Social History Narrative   Lives with husband.   Social Determinants of Health   Financial Resource  Strain: Not on file  Food Insecurity: Not on file  Transportation Needs: Not on file  Physical Activity: Not on file  Stress: Not on file  Social Connections: Not on file  Intimate Partner Violence: Not on file   Immunization History  Administered Date(s) Administered   Influenza,inj,Quad PF,6+ Mos 03/16/2013, 01/20/2014, 03/01/2015, 02/19/2016, 02/14/2017, 02/02/2018   Pneumococcal Conjugate-13 12/14/2015   Pneumococcal Polysaccharide-23 03/24/2014   Tdap 11/25/2013   No Known Allergies   Assessment and Plan: 1. Hb-SS disease without crisis [facility]) Patient has marked scleral icterus as seen on video.  She will report to clinic for CBC with differential, reticulocytes, type and screen, and LDH.  We will transfuse 1 unit PRBCs if hemoglobin is less than 7.0 g/dL.  2. Chronic pain syndrome Will continue oxycodone 10 mg every 4 hours as needed for severe breakthrough pain.  Ibuprofen every 8 hours with food for mild to moderate  Follow Up Instructions:    I discussed the assessment and treatment plan with the patient. The patient was provided an opportunity to ask questions and all were answered. The patient agreed with the plan and demonstrated an understanding of the instructions.   The patient was advised to call back or seek an in-person evaluation if the symptoms worsen or if the condition fails to improve as anticipated.  I provided 10 minutes of non-face-to-face time during this encounter.  [name]  APRN, MSN, FNP-C Patient [facility] [address] [place], [place] [address] [phone]  11/07/2020  3:22 PM

## 2020-11-08 ENCOUNTER — Other Ambulatory Visit: Payer: Medicaid Other

## 2020-11-08 ENCOUNTER — Other Ambulatory Visit (INDEPENDENT_AMBULATORY_CARE_PROVIDER_SITE_OTHER): Payer: Medicaid Other | Admitting: Family Medicine

## 2020-11-08 DIAGNOSIS — D571 Sickle-cell disease without crisis: Secondary | ICD-10-CM

## 2020-11-08 DIAGNOSIS — G894 Chronic pain syndrome: Secondary | ICD-10-CM

## 2020-11-08 DIAGNOSIS — E559 Vitamin D deficiency, unspecified: Secondary | ICD-10-CM

## 2020-11-08 LAB — POCT URINALYSIS DIPSTICK
Bilirubin, UA: NEGATIVE
Blood, UA: NEGATIVE
Glucose, UA: NEGATIVE
Ketones, UA: NEGATIVE
Leukocytes, UA: NEGATIVE
Nitrite, UA: NEGATIVE
Protein, UA: NEGATIVE
Spec Grav, UA: 1.015 (ref 1.010–1.025)
Urobilinogen, UA: 0.2 E.U./dL
pH, UA: 6 (ref 5.0–8.0)

## 2020-11-08 NOTE — Progress Notes (Signed)
Orders Placed This Encounter  Procedures   Sickle Cell Panel    Standing Status:   Future    Standing Expiration Date:   11/08/2021   770340 11+Oxyco+Alc+Crt-Bund    Standing Status:   Future    Standing Expiration Date:   11/08/2021   Urinalysis Dipstick      [name]  APRN, MSN, FNP-C Patient Agency [address] [place], [place] [address] [phone]

## 2020-11-09 ENCOUNTER — Non-Acute Institutional Stay (HOSPITAL_COMMUNITY)
Admission: AD | Admit: 2020-11-09 | Discharge: 2020-11-09 | Disposition: A | Discharge status: Home / Self Care | Payer: Medicaid Other | Source: Ambulatory Visit | Attending: Internal Medicine | Admitting: Internal Medicine

## 2020-11-09 ENCOUNTER — Telehealth (HOSPITAL_COMMUNITY): Payer: Self-pay

## 2020-11-09 DIAGNOSIS — G894 Chronic pain syndrome: Secondary | ICD-10-CM | POA: Insufficient documentation

## 2020-11-09 DIAGNOSIS — D638 Anemia in other chronic diseases classified elsewhere: Secondary | ICD-10-CM | POA: Diagnosis not present

## 2020-11-09 DIAGNOSIS — F1120 Opioid dependence, uncomplicated: Secondary | ICD-10-CM | POA: Insufficient documentation

## 2020-11-09 DIAGNOSIS — D5700 Hb-SS disease with crisis, unspecified: Secondary | ICD-10-CM | POA: Diagnosis present

## 2020-11-09 LAB — CMP14+CBC/D/PLT+FER+RETIC+V...
ALT: 18 IU/L (ref 0–32)
AST: 32 IU/L (ref 0–40)
Albumin/Globulin Ratio: 1.1 — ABNORMAL LOW (ref 1.2–2.2)
Albumin: 4 g/dL (ref 3.8–4.8)
Alkaline Phosphatase: 81 IU/L (ref 44–121)
BUN/Creatinine Ratio: 9 (ref 9–23)
BUN: 5 mg/dL — ABNORMAL LOW (ref 6–24)
Basophils Absolute: 0.1 10*3/uL (ref 0.0–0.2)
Basos: 1 %
Bilirubin Total: 3 mg/dL — ABNORMAL HIGH (ref 0.0–1.2)
CO2: 18 mmol/L — ABNORMAL LOW (ref 20–29)
Calcium: 9.1 mg/dL (ref 8.7–10.2)
Chloride: 103 mmol/L (ref 96–106)
Creatinine, Ser: 0.54 mg/dL — ABNORMAL LOW (ref 0.57–1.00)
EOS (ABSOLUTE): 0.5 10*3/uL — ABNORMAL HIGH (ref 0.0–0.4)
Eos: 4 %
Ferritin: 1317 ng/mL — ABNORMAL HIGH (ref 15–150)
Globulin, Total: 3.6 g/dL (ref 1.5–4.5)
Glucose: 95 mg/dL (ref 65–99)
Hematocrit: 16 % — CL (ref 34.0–46.6)
Hemoglobin: 5.7 g/dL — CL (ref 11.1–15.9)
Immature Grans (Abs): 0.1 10*3/uL (ref 0.0–0.1)
Immature Granulocytes: 1 %
Lymphocytes Absolute: 2.3 10*3/uL (ref 0.7–3.1)
Lymphs: 17 %
MCH: 31.3 pg (ref 26.6–33.0)
MCHC: 35.6 g/dL (ref 31.5–35.7)
MCV: 88 fL (ref 79–97)
Monocytes Absolute: 1.5 10*3/uL — ABNORMAL HIGH (ref 0.1–0.9)
Monocytes: 11 %
NRBC: 15 % — ABNORMAL HIGH (ref 0–0)
Neutrophils Absolute: 9.3 10*3/uL — ABNORMAL HIGH (ref 1.4–7.0)
Neutrophils: 66 %
Platelets: 464 10*3/uL — ABNORMAL HIGH (ref 150–450)
Potassium: 4.3 mmol/L (ref 3.5–5.2)
RBC: 1.82 x10E6/uL — CL (ref 3.77–5.28)
RDW: 21.4 % — ABNORMAL HIGH (ref 11.7–15.4)
Retic Ct Pct: 23.9 % — ABNORMAL HIGH (ref 0.6–2.6)
Sodium: 139 mmol/L (ref 134–144)
Total Protein: 7.6 g/dL (ref 6.0–8.5)
Vit D, 25-Hydroxy: 29.8 ng/mL — ABNORMAL LOW (ref 30.0–100.0)
WBC: 13.8 10*3/uL — ABNORMAL HIGH (ref 3.4–10.8)
eGFR: 113 mL/min/{1.73_m2} (ref >59)

## 2020-11-09 LAB — COMPREHENSIVE METABOLIC PANEL
ALT: 18 U/L (ref 0–44)
AST: 32 U/L (ref 15–41)
Albumin: 3.9 g/dL (ref 3.5–5.0)
Alkaline Phosphatase: 61 U/L (ref 38–126)
Anion gap: 7 (ref 5–15)
BUN: 5 mg/dL — ABNORMAL LOW (ref 6–20)
CO2: 23 mmol/L (ref 22–32)
Calcium: 8.8 mg/dL — ABNORMAL LOW (ref 8.9–10.3)
Chloride: 108 mmol/L (ref 98–111)
Creatinine, Ser: 0.41 mg/dL — ABNORMAL LOW (ref 0.44–1.00)
GFR, Estimated: >60 mL/min (ref >60)
Glucose, Bld: 90 mg/dL (ref 70–99)
Potassium: 3.8 mmol/L (ref 3.5–5.1)
Sodium: 138 mmol/L (ref 135–145)
Total Bilirubin: 3.6 mg/dL — ABNORMAL HIGH (ref 0.3–1.2)
Total Protein: 7.7 g/dL (ref 6.5–8.1)

## 2020-11-09 LAB — CBC WITH DIFFERENTIAL/PLATELET
Abs Immature Granulocytes: 0.14 10*3/uL — ABNORMAL HIGH (ref 0.00–0.07)
Basophils Absolute: 0.1 10*3/uL (ref 0.0–0.1)
Basophils Relative: 1 %
Eosinophils Absolute: 0.3 10*3/uL (ref 0.0–0.5)
Eosinophils Relative: 2 %
HCT: 15.5 % — ABNORMAL LOW (ref 36.0–46.0)
Hemoglobin: 5.5 g/dL — CL (ref 12.0–15.0)
Immature Granulocytes: 1 %
Lymphocytes Relative: 10 %
Lymphs Abs: 2 10*3/uL (ref 0.7–4.0)
MCH: 31.3 pg (ref 26.0–34.0)
MCHC: 35.5 g/dL (ref 30.0–36.0)
MCV: 88.1 fL (ref 80.0–100.0)
Monocytes Absolute: 1.6 10*3/uL — ABNORMAL HIGH (ref 0.1–1.0)
Monocytes Relative: 9 %
Neutro Abs: 15.2 10*3/uL — ABNORMAL HIGH (ref 1.7–7.7)
Neutrophils Relative %: 77 %
Platelets: 425 10*3/uL — ABNORMAL HIGH (ref 150–400)
RBC: 1.76 MIL/uL — ABNORMAL LOW (ref 3.87–5.11)
RDW: 26.5 % — ABNORMAL HIGH (ref 11.5–15.5)
WBC: 19.4 10*3/uL — ABNORMAL HIGH (ref 4.0–10.5)
nRBC: 10.8 % — ABNORMAL HIGH (ref 0.0–0.2)

## 2020-11-09 LAB — PREPARE RBC (CROSSMATCH)

## 2020-11-09 LAB — HEMOGLOBIN AND HEMATOCRIT, BLOOD
HCT: 21.4 % — ABNORMAL LOW (ref 36.0–46.0)
Hemoglobin: 7.4 g/dL — ABNORMAL LOW (ref 12.0–15.0)

## 2020-11-09 LAB — RETICULOCYTES
Immature Retic Fract: 37.1 % — ABNORMAL HIGH (ref 2.3–15.9)
RBC.: 1.75 MIL/uL — ABNORMAL LOW (ref 3.87–5.11)
Retic Count, Absolute: 388.5 10*3/uL — ABNORMAL HIGH (ref 19.0–186.0)
Retic Ct Pct: 22.2 % — ABNORMAL HIGH (ref 0.4–3.1)

## 2020-11-09 LAB — LACTATE DEHYDROGENASE: LDH: 312 U/L — ABNORMAL HIGH (ref 98–192)

## 2020-11-09 MED ORDER — SODIUM CHLORIDE 0.45 % IV SOLN: INTRAVENOUS | Status: DC

## 2020-11-09 MED ORDER — ACETAMINOPHEN 325 MG PO TABS
650.0000 mg | ORAL_TABLET | Freq: Once | ORAL | Status: AC
  Administered 2020-11-09: 650 mg via ORAL
  Filled 2020-11-09: qty 2

## 2020-11-09 MED ORDER — DIPHENHYDRAMINE HCL 50 MG/ML IJ SOLN
25.0000 mg | Freq: Once | INTRAMUSCULAR | Status: AC
  Administered 2020-11-09: 25 mg via INTRAVENOUS
  Filled 2020-11-09: qty 1

## 2020-11-09 MED ORDER — DIPHENHYDRAMINE HCL 25 MG PO CAPS: 25.0000 mg | ORAL_CAPSULE | ORAL | Status: DC | PRN

## 2020-11-09 MED ORDER — SODIUM CHLORIDE 0.9% FLUSH: 9.0000 mL | INTRAVENOUS | Status: DC | PRN

## 2020-11-09 MED ORDER — ONDANSETRON HCL 4 MG/2ML IJ SOLN: 4.0000 mg | Freq: Four times a day (QID) | INTRAMUSCULAR | Status: DC | PRN

## 2020-11-09 MED ORDER — NALOXONE HCL 0.4 MG/ML IJ SOLN: 0.4000 mg | INTRAMUSCULAR | Status: DC | PRN

## 2020-11-09 MED ORDER — KETOROLAC TROMETHAMINE 30 MG/ML IJ SOLN: 15.0000 mg | Freq: Once | INTRAMUSCULAR | Status: DC

## 2020-11-09 MED ORDER — HYDROMORPHONE 1 MG/ML IV SOLN
INTRAVENOUS | Status: DC
  Administered 2020-11-09: 30 mg via INTRAVENOUS
  Administered 2020-11-09: 4 mg via INTRAVENOUS
  Administered 2020-11-09: 1.5 mg via INTRAVENOUS
  Filled 2020-11-09: qty 30

## 2020-11-09 NOTE — Telephone Encounter (Signed)
Patient called in. Returning call from [place] FNP requesting pt come to day hospital to receive a blood transfusion for a Hgb of 5.7. Pt verbalized understanding and states she will come to the center today. Complains of pain in her legs due to chronic leg ulcers rates 7/10. Denied chest pain, abd pain, fever, N/V/D. Last took oxycodone 10mg  at 5am.  Denies exposure to anyone covid positive in last two weeks, denies any flu like symptoms today. Denies recent visits to ED. [place] FNP made aware pt is coming to day hospital for blood transfusion and pain management.

## 2020-11-09 NOTE — H&P (Signed)
[facility] History and Physical   Date: 11/09/2020  Patient name: [name] Medical record number: [id number] Date of birth: Oct 25, 1970 Age: 50 y.o. Gender: female PCP: [name], FNP  Attending physician: Tresa Garter, MD  Chief Complaint: Sickle cell pain  History of Present Illness: [name] is a 50 year old female with a medical history significant for sickle cell disease type SS, chronic pain syndrome, opiate dependence and tolerance, chronic right lower extremity ulcer, and history of anemia of chronic disease presents with 9/10 pain primarily to right lower extremity.  Patient was seen by video on 11/07/2020.  At that time, she states that she had been feeling well and that her eyes have been persistently yellow.  Patient presented for labs on 11/08/2020 and was found to have a hemoglobin of 5.7.  Patient denies any shortness of breath, fatigue, dizziness, or chest pain.  Today, patient has pain primarily to lower extremities rated at 9/10.  She last had oxycodone last night with some relief.  She denies any headache, blurred vision, urinary symptoms, nausea, vomiting, or diarrhea. Meds: Medications Prior to Admission  Medication Sig Dispense Refill Last Dose   folic acid (FOLVITE) 1 MG tablet Take 5 tablets (5 mg total) by mouth daily. 150 tablet 11 11/09/2020   ibuprofen (ADVIL) 600 MG tablet TAKE 1 TABLET(600 MG) BY MOUTH EVERY 6 HOURS 60 tablet 2 11/09/2020   Amino Acids-Protein Hydrolys (FEEDING SUPPLEMENT, PRO-STAT 64,) LIQD Take 30 mLs by mouth 3 (three) times daily with meals. 887 mL 1 Unknown   Deferasirox (JADENU) 360 MG TABS Take 1 tablet (360 mg total) by mouth daily. (Patient not taking: No sig reported) 90 tablet 0     Allergies: Patient has no known allergies. Past Medical History:  Diagnosis Date   Anemia 03/30/2012   Hx of sickle cell disease   CAP (community acquired pneumonia)    2014   Cholelithiasis 03/30/2012   Chronic  wound of extremity 04/01/2012   Elevated LFTs    Leg ulcer ([place]) 10/27/2012   Chronic under care of wound clinic   Multiple open wounds of lower extremity    chronic wounds B/LLE   Sickle cell disease ([place])    Sinus bradycardia by electrocardiogram 04/03/2012   Past Surgical History:  Procedure Laterality Date   ALLOGRAFT APPLICATION Right 08/16/5186   Procedure: SURGICAL PREP FOR GRAFTING RIGHT LOWER EXTREMITY AND APPLICATION OF Jannifer Hick;  Surgeon: Irene Limbo, MD;  Location: [place];  Service: Plastics;  Laterality: Right;   APPLICATION OF A-CELL OF EXTREMITY Right 10/09/2015   Procedure: APPLICATION OF Jannifer Hick;  Surgeon: Irene Limbo, MD;  Location: [place];  Service: Plastics;  Laterality: Right;   BREAST SURGERY     left breast cyst aspiration   CESAREAN SECTION     CESAREAN SECTION N/A 01/06/2014   Procedure: CESAREAN SECTION;  Surgeon: Emily Filbert, MD;  Location: [facility];  Service: Obstetrics;  Laterality: N/A;   CESAREAN SECTION N/A 05/04/2017   Procedure: CESAREAN SECTION;  Surgeon: Woodroe Mode, MD;  Location: [place];  Service: Obstetrics;  Laterality: N/A;   I & D EXTREMITY Right 10/09/2015   Procedure: SURGICAL PREPARATION FOR GRAFTING RIGHT ANKLE AND APPLICATION THERASKIN;  Surgeon: Irene Limbo, MD;  Location: [place];  Service: Plastics;  Laterality: Right;   SKIN FULL THICKNESS GRAFT Bilateral 06/17/2016   Procedure: SURGICAL PREP FOR GRAFTING, BILATERAL LOWER EXTREMITIES AND APPLICATION OF THERASKIN;  Surgeon: Irene Limbo, MD;  Location: [place];  Service: Plastics;  Laterality: Bilateral;   TONSILLECTOMY     Family History  Problem Relation Age of Onset   Sickle cell anemia Sister    Diabetes Mother    Asthma Mother    Sickle cell trait Mother    Sickle cell trait Father    Hypertension Father    Social History   Socioeconomic History   Marital status: Married    Spouse name:  [name]   Number of children: 1   Years of education: Not on file   Highest education level: Not on file  Occupational History   Occupation: Employed in home care.  Tobacco Use   Smoking status: Never   Smokeless tobacco: Never  Vaping Use   Vaping Use: Never used  Substance and Sexual Activity   Alcohol use: No   Drug use: No   Sexual activity: Yes    Birth control/protection: None  Other Topics Concern   Not on file  Social History Narrative   Lives with husband.   Social Determinants of Health   Financial Resource Strain: Not on file  Food Insecurity: Not on file  Transportation Needs: Not on file  Physical Activity: Not on file  Stress: Not on file  Social Connections: Not on file  Intimate Partner Violence: Not on file   Review of Systems  Constitutional:  Negative for chills and fever.  HENT: Negative.    Eyes: Negative.   Respiratory: Negative.    Cardiovascular: Negative.   Gastrointestinal: Negative.   Genitourinary: Negative.   Musculoskeletal:  Positive for back pain and joint pain.  Skin: Negative.   Neurological: Negative.   Psychiatric/Behavioral: Negative.     Physical Exam: Blood pressure 108/63, pulse 64, temperature 98.1 F (36.7 C), temperature source Temporal, resp. rate 20, last menstrual period 10/18/2020, SpO2 97 %, unknown if currently breastfeeding. Physical Exam Constitutional:      Appearance: Normal appearance.  Eyes:     General: Scleral icterus present.     Pupils: Pupils are equal, round, and reactive to light.  Cardiovascular:     Rate and Rhythm: Normal rate and regular rhythm.     Pulses: Normal pulses.  Pulmonary:     Effort: Pulmonary effort is normal.  Abdominal:     General: Abdomen is flat. Bowel sounds are normal.  Musculoskeletal:        General: Normal range of motion.  Skin:    General: Skin is warm.  Neurological:     General: No focal deficit present.     Mental Status: She is alert. Mental  status is at baseline.  Psychiatric:        Mood and Affect: Mood normal.        Behavior: Behavior normal.        Thought Content: Thought content normal.        Judgment: Judgment normal.     Lab results: Results for orders placed or performed during the hospital encounter of 11/09/20 (from the past 24 hour(s))  Lactate dehydrogenase     Status: Abnormal   Collection Time: 11/09/20 10:09 AM  Result Value Ref Range   LDH 312 (H) 98 - 192 U/L  Comprehensive metabolic panel     Status: Abnormal   Collection Time: 11/09/20 10:09 AM  Result Value Ref Range   Sodium 138 135 - 145 mmol/L   Potassium 3.8 3.5 - 5.1 mmol/L   Chloride 108 98 - 111  mmol/L   CO2 23 22 - 32 mmol/L   Glucose, Bld 90 70 - 99 mg/dL   BUN 5 (L) 6 - 20 mg/dL   Creatinine, Ser 0.41 (L) 0.44 - 1.00 mg/dL   Calcium 8.8 (L) 8.9 - 10.3 mg/dL   Total Protein 7.7 6.5 - 8.1 g/dL   Albumin 3.9 3.5 - 5.0 g/dL   AST 32 15 - 41 U/L   ALT 18 0 - 44 U/L   Alkaline Phosphatase 61 38 - 126 U/L   Total Bilirubin 3.6 (H) 0.3 - 1.2 mg/dL   GFR, Estimated >60 >60 mL/min   Anion gap 7 5 - 15  CBC WITH DIFFERENTIAL     Status: Abnormal   Collection Time: 11/09/20 10:09 AM  Result Value Ref Range   WBC 19.4 (H) 4.0 - 10.5 K/uL   RBC 1.76 (L) 3.87 - 5.11 MIL/uL   Hemoglobin 5.5 (LL) 12.0 - 15.0 g/dL   HCT 15.5 (L) 36.0 - 46.0 %   MCV 88.1 80.0 - 100.0 fL   MCH 31.3 26.0 - 34.0 pg   MCHC 35.5 30.0 - 36.0 g/dL   RDW 26.5 (H) 11.5 - 15.5 %   Platelets 425 (H) 150 - 400 K/uL   nRBC 10.8 (H) 0.0 - 0.2 %   Neutrophils Relative % 77 %   Neutro Abs 15.2 (H) 1.7 - 7.7 K/uL   Lymphocytes Relative 10 %   Lymphs Abs 2.0 0.7 - 4.0 K/uL   Monocytes Relative 9 %   Monocytes Absolute 1.6 (H) 0.1 - 1.0 K/uL   Eosinophils Relative 2 %   Eosinophils Absolute 0.3 0.0 - 0.5 K/uL   Basophils Relative 1 %   Basophils Absolute 0.1 0.0 - 0.1 K/uL   Immature Granulocytes 1 %   Abs Immature Granulocytes 0.14 (H) 0.00 - 0.07 K/uL    Polychromasia PRESENT    Sickle Cells PRESENT    Target Cells PRESENT   Reticulocytes     Status: Abnormal   Collection Time: 11/09/20 10:09 AM  Result Value Ref Range   Retic Ct Pct 22.2 (H) 0.4 - 3.1 %   RBC. 1.75 (L) 3.87 - 5.11 MIL/uL   Retic Count, Absolute 388.5 (H) 19.0 - 186.0 K/uL   Immature Retic Fract 37.1 (H) 2.3 - 15.9 %  Type and screen [place]     Status: None (Preliminary result)   Collection Time: 11/09/20 10:10 AM  Result Value Ref Range   ABO/RH(D) B POS    Antibody Screen NEG    Sample Expiration 11/12/2020,2359    Unit Number [id number]    Blood Component Type RED CELLS,LR    Unit division 00    Status of Unit ISSUED    Donor AG Type      NEGATIVE FOR E ANTIGEN NEGATIVE FOR C ANTIGEN NEGATIVE FOR KELL ANTIGEN   Transfusion Status OK TO TRANSFUSE    Crossmatch Result Compatible    Unit Number [id number]    Blood Component Type RED CELLS,LR    Unit division 00    Status of Unit ISSUED    Donor AG Type      NEGATIVE FOR E ANTIGEN NEGATIVE FOR C ANTIGEN NEGATIVE FOR KELL ANTIGEN   Transfusion Status OK TO TRANSFUSE    Crossmatch Result      Compatible Performed at [facility], [place] [address], [place], [place] [address]   Prepare RBC (crossmatch)     Status: None   Collection  Time: 11/09/20 10:10 AM  Result Value Ref Range   Order Confirmation      ORDER PROCESSED BY BLOOD BANK Performed at [facility] [address], [place], [place] [address]     Imaging results:  No results found.   Assessment & Plan: Sickle cell disease with pain crisis: Patient admitted to sickle cell day infusion clinic with complaints of lower extremity pain. Patient is opiate tolerant. Pain managed with IV Dilaudid via PCA with settings of 0.5 mg, clinic lockout, and 3 mg/h Toradol 15 mg x 1 0.45% saline at 50 mL/h Monitor vital signs very closely, reevaluate pain scale regularly, and supplemental  oxygen as needed Patient will be reevaluated for pain in the context of function and relationship to baseline as her care progresses. If pain intensity remains elevated, consider transitioning to inpatient services for higher level of care.  Anemia of chronic disease: Patient's hemoglobin is 5.7 on admission.  Patient's baseline is 7-8 g/dL.  Patient will be transfused 2 units PRBCs with premedications of IV Benadryl and Tylenol.   [name]  APRN, MSN, FNP-C [facility] [address] [place], [place] [address] [phone]  11/09/2020, 12:37 PM

## 2020-11-09 NOTE — Discharge Summary (Signed)
[facility] Discharge Summary   Patient ID: [name] MRN: [id number] DOB/AGE: [birth year] 50 y.o.  Admit date: 11/09/2020 Discharge date: 11/09/2020  Primary Care Physician:  [name], FNP  Admission Diagnoses:  Active Problems:   Sickle cell pain crisis [facility])   Discharge Medications:  Allergies as of 11/09/2020   No Known Allergies      Medication List     TAKE these medications    Deferasirox 360 MG Tabs Commonly known as: Jadenu Take 1 tablet (360 mg total) by mouth daily.   feeding supplement (PRO-STAT 64) Liqd Take 30 mLs by mouth 3 (three) times daily with meals.   folic acid 1 MG tablet Commonly known as: FOLVITE Take 5 tablets (5 mg total) by mouth daily.   ibuprofen 600 MG tablet Commonly known as: ADVIL TAKE 1 TABLET(600 MG) BY MOUTH EVERY 6 HOURS         Consults:  None  Significant Diagnostic Studies:  No results found.  History of present illness:  [name] is a 50 year old female with a medical history significant for sickle cell disease type SS, chronic pain syndrome, opiate dependence and tolerance, chronic right lower extremity ulcer, and history of anemia of chronic disease presents with 9/10 pain primarily to right lower extremity.  Patient was seen by video on 11/07/2020.  At that time, she states that she had been feeling well and that her eyes have been persistently yellow.  Patient presented for labs on 11/08/2020 and was found to have a hemoglobin of 5.7.  Patient denies any shortness of breath, fatigue, dizziness, or chest pain.  Today, patient has pain primarily to lower extremities rated at 9/10.  She last had oxycodone last night with some relief.  She denies any headache, blurred vision, urinary symptoms, nausea, vomiting, or diarrhea. [facility] Course:  Sickle cell pain crisis:  Patient admitted to sickle cell day infusion clinic for management of pain crisis. WBCs 19.4, patient is  afebrile and is without signs of acute infection. Patient's hemoglobin is 5.5, which is below her baseline of 7-8 g/dL LDH elevated at 312 All other laboratory values unremarkable Pain managed with IV Dilaudid PCA IV Toradol 15 mg x 1 IV fluids, 0.45% saline at 50 mL/h Patient's pain intensity decreased to 5/10.  She is hemodynamically stable.  Anemia of chronic disease: Patient's hemoglobin is 5.5, which is below her baseline of 7-8 g/dL.  Patient's absolute reticulocyte count was 388.5 LDH elevated at 312. Patient transfused 2 units PRBCs without complication. Posttransfusion H&H pending.  We will follow-up with patient on 11/10/2020 to discuss care plan going forward.  Patient was previously on continuous monthly exchange transfusions, however she has been unable to continue simple exchanges due to childcare constraints.   Discharge instructions: Resume all home medications.   Follow up with PCP as previously  scheduled.   Discussed the importance of drinking 64 ounces of water daily, dehydration of red blood cells may lead further sickling.   Avoid all stressors that precipitate sickle cell pain crisis.     The patient was given clear instructions to go to ER or return to medical center if symptoms do not improve, worsen or new problems develop.   Physical Exam at Discharge:  BP 119/63 (BP Location: Left Arm)   Pulse 63   Temp 98.5 F (36.9 C) (Oral)   Resp 16   LMP 10/18/2020   SpO2 99%   Physical Exam Constitutional:  Appearance: Normal appearance.  HENT:     Nose: Nose normal.  Eyes:     Pupils: Pupils are equal, round, and reactive to light.  Cardiovascular:     Rate and Rhythm: Normal rate and regular rhythm.     Pulses: Normal pulses.  Pulmonary:     Effort: Pulmonary effort is normal.  Abdominal:     General: Abdomen is flat. Bowel sounds are normal.  Musculoskeletal:        General: Normal range of motion.  Skin:    General: Skin is warm.   Neurological:     General: No focal deficit present.     Mental Status: She is alert. Mental status is at baseline.  Psychiatric:        Mood and Affect: Mood normal.        Behavior: Behavior normal.        Thought Content: Thought content normal.      Disposition at Discharge: Discharge disposition: 01-Home or Self Care       Discharge Orders:   Condition at Discharge:   Stable  Time spent on Discharge:  Greater than 30 minutes.  Signed: [name]  APRN, MSN, FNP-C [facility] [address] [place], [place] [address] [phone]  11/09/2020, 4:47 PM

## 2020-11-09 NOTE — Progress Notes (Addendum)
Patient received via 2 units of packed red blood cells as ordered by [place] FNP. Type and screen and CBC was collected before transfusion.  Critical lab value reported by [facility] from core labs, Hgb 5.5. [place] FNP notified and aware of Hgb 5.7 drawn on 11/08/20. Pre transfusion medications were given. Consent for blood products is valid. Prior to blood transfusion, pt was placed on Dilaudid PCA for sickle cell pain management. Pt reports pain to R leg, rated 9/10. PCA paused during blood tranfusion, as pt states her pain is controlled and rates it a 5/10, PCA resumed after blood finished infusing.  Post transfusion H&H was drawn. Pt tolerated transfusion well, vitals stable, pt declined AVS.  Patient alert, oriented and ambulatory at the time of discharge.

## 2020-11-10 LAB — TYPE AND SCREEN
ABO/RH(D): B POS
Antibody Screen: NEGATIVE
Unit division: 0
Unit division: 0

## 2020-11-10 LAB — BPAM RBC
Blood Product Expiration Date: 202207152359
Blood Product Expiration Date: 202207182359
ISSUE DATE / TIME: 202206231132
ISSUE DATE / TIME: 202206231132
Unit Type and Rh: 5100
Unit Type and Rh: 5100

## 2020-11-15 LAB — OXYCODONE/OXYMORPHONE, CONFIRM
OXYCODONE/OXYMORPH: POSITIVE — AB
OXYCODONE: 2382 ng/mL
OXYCODONE: POSITIVE — AB
OXYMORPHONE (GC/MS): 2721 ng/mL
OXYMORPHONE: POSITIVE — AB

## 2020-11-15 LAB — DRUG SCREEN 764883 11+OXYCO+ALC+CRT-BUND
Amphetamines, Urine: NEGATIVE ng/mL
BENZODIAZ UR QL: NEGATIVE ng/mL
Barbiturate: NEGATIVE ng/mL
Cannabinoid Quant, Ur: NEGATIVE ng/mL
Cocaine (Metabolite): NEGATIVE ng/mL
Creatinine: 63.2 mg/dL (ref 20.0–300.0)
Ethanol: NEGATIVE %
Meperidine: NEGATIVE ng/mL
Methadone Screen, Urine: NEGATIVE ng/mL
Phencyclidine: NEGATIVE ng/mL
Propoxyphene: NEGATIVE ng/mL
Tramadol: NEGATIVE ng/mL
pH, Urine: 5.7 (ref 4.5–8.9)

## 2020-11-15 LAB — OPIATES CONFIRMATION, URINE: Opiates: NEGATIVE ng/mL

## 2020-11-22 ENCOUNTER — Other Ambulatory Visit: Payer: Self-pay | Admitting: Family Medicine

## 2020-11-22 ENCOUNTER — Telehealth: Payer: Self-pay

## 2020-11-22 DIAGNOSIS — G894 Chronic pain syndrome: Secondary | ICD-10-CM

## 2020-11-22 DIAGNOSIS — D571 Sickle-cell disease without crisis: Secondary | ICD-10-CM

## 2020-11-22 MED ORDER — OXYCODONE HCL 10 MG PO TABS: 10.0000 mg | ORAL_TABLET | ORAL | 0 refills | Status: DC | PRN

## 2020-11-22 NOTE — Telephone Encounter (Signed)
Oxycodone 10mg

## 2020-11-22 NOTE — Progress Notes (Signed)
Reviewed PDMP substance reporting system prior to prescribing opiate medications. No inconsistencies noted.  Meds ordered this encounter  Medications   Oxycodone HCl 10 MG TABS    Sig: Take 1 tablet (10 mg total) by mouth every 4 (four) hours as needed.    Dispense:  90 tablet    Refill:  0    Order Specific Question:   Supervising Provider    Answer:   JEGEDE, OLUGBEMIGA E [[id number]]   [name] Maris Bena  APRN, MSN, FNP-C [facility] [address]  [place], [place] [address] [phone]  

## 2020-12-19 ENCOUNTER — Other Ambulatory Visit: Payer: Self-pay | Admitting: Family Medicine

## 2020-12-19 ENCOUNTER — Telehealth: Payer: Self-pay

## 2020-12-19 DIAGNOSIS — G894 Chronic pain syndrome: Secondary | ICD-10-CM

## 2020-12-19 DIAGNOSIS — D571 Sickle-cell disease without crisis: Secondary | ICD-10-CM

## 2020-12-19 MED ORDER — OXYCODONE HCL 10 MG PO TABS: 10.0000 mg | ORAL_TABLET | ORAL | 0 refills | Status: DC | PRN

## 2020-12-19 NOTE — Telephone Encounter (Signed)
Oxycodone 10mg

## 2020-12-19 NOTE — Progress Notes (Signed)
Reviewed PDMP substance reporting system prior to prescribing opiate medications. No inconsistencies noted.  Meds ordered this encounter  Medications   Oxycodone HCl 10 MG TABS    Sig: Take 1 tablet (10 mg total) by mouth every 4 (four) hours as needed.    Dispense:  90 tablet    Refill:  0    Order Specific Question:   Supervising Provider    Answer:   JEGEDE, OLUGBEMIGA E [[id number]]   [name] [name] Chait  APRN, MSN, FNP-C [facility] [address]  [place], [place] [address] [phone]  

## 2021-01-10 ENCOUNTER — Telehealth: Payer: Self-pay

## 2021-01-10 ENCOUNTER — Other Ambulatory Visit: Payer: Self-pay | Admitting: Internal Medicine

## 2021-01-10 DIAGNOSIS — D571 Sickle-cell disease without crisis: Secondary | ICD-10-CM

## 2021-01-10 DIAGNOSIS — G894 Chronic pain syndrome: Secondary | ICD-10-CM

## 2021-01-10 MED ORDER — IBUPROFEN 600 MG PO TABS: ORAL_TABLET | ORAL | 2 refills | Status: DC

## 2021-01-10 MED ORDER — OXYCODONE HCL 10 MG PO TABS: 10.0000 mg | ORAL_TABLET | ORAL | 0 refills | Status: AC | PRN

## 2021-01-10 NOTE — Telephone Encounter (Signed)
Oxycodone Ibuprofen 600 mg

## 2021-01-31 ENCOUNTER — Telehealth: Payer: Self-pay

## 2021-01-31 ENCOUNTER — Other Ambulatory Visit: Payer: Self-pay | Admitting: Family Medicine

## 2021-01-31 DIAGNOSIS — G894 Chronic pain syndrome: Secondary | ICD-10-CM

## 2021-01-31 MED ORDER — OXYCODONE HCL 10 MG PO TABS: 10.0000 mg | ORAL_TABLET | ORAL | 0 refills | Status: DC | PRN

## 2021-01-31 NOTE — Telephone Encounter (Signed)
Oxycodone  °

## 2021-01-31 NOTE — Progress Notes (Signed)
Reviewed PDMP substance reporting system prior to prescribing opiate medications. No inconsistencies noted.  Meds ordered this encounter  Medications   Oxycodone HCl 10 MG TABS    Sig: Take 1 tablet (10 mg total) by mouth every 4 (four) hours as needed.    Dispense:  90 tablet    Refill:  0    Order Specific Question:   Supervising Provider    Answer:   JEGEDE, OLUGBEMIGA E [[id number]]   [name] Bronkema Moore Leontine Radman  APRN, MSN, FNP-C [facility] [address]  [place], [place] [address] [phone]  

## 2021-02-20 ENCOUNTER — Other Ambulatory Visit: Payer: Self-pay | Admitting: Family Medicine

## 2021-02-20 ENCOUNTER — Telehealth: Payer: Self-pay

## 2021-02-20 DIAGNOSIS — G894 Chronic pain syndrome: Secondary | ICD-10-CM

## 2021-02-20 MED ORDER — OXYCODONE HCL 10 MG PO TABS: 10.0000 mg | ORAL_TABLET | ORAL | 0 refills | Status: DC | PRN

## 2021-02-20 NOTE — Telephone Encounter (Signed)
Oxycodone 10mg

## 2021-02-20 NOTE — Progress Notes (Signed)
Reviewed PDMP substance reporting system prior to prescribing opiate medications. No inconsistencies noted.  Meds ordered this encounter  Medications   Oxycodone HCl 10 MG TABS    Sig: Take 1 tablet (10 mg total) by mouth every 4 (four) hours as needed.    Dispense:  90 tablet    Refill:  0    Order Specific Question:   Supervising Provider    Answer:   JEGEDE, OLUGBEMIGA E [[id number]]   Royal Beirne Moore Clydie Dillen  APRN, MSN, FNP-C [facility] [address]  [place],  [address] [phone]  

## 2021-03-08 ENCOUNTER — Telehealth: Payer: Self-pay

## 2021-03-08 NOTE — Telephone Encounter (Signed)
Pt is asking for refill Oxycodone 10 mg

## 2021-03-09 ENCOUNTER — Other Ambulatory Visit: Payer: Self-pay | Admitting: Family Medicine

## 2021-03-09 DIAGNOSIS — G894 Chronic pain syndrome: Secondary | ICD-10-CM

## 2021-03-09 MED ORDER — OXYCODONE HCL 10 MG PO TABS: 10.0000 mg | ORAL_TABLET | ORAL | 0 refills | Status: DC | PRN

## 2021-03-09 NOTE — Progress Notes (Signed)
Reviewed PDMP substance reporting system prior to prescribing opiate medications. No inconsistencies noted.  Meds ordered this encounter  Medications   Oxycodone HCl 10 MG TABS    Sig: Take 1 tablet (10 mg total) by mouth every 4 (four) hours as needed.    Dispense:  90 tablet    Refill:  0    Order Specific Question:   Supervising Provider    Answer:   JEGEDE, OLUGBEMIGA E [[id number]]   Loreto Loescher Moore Kalisi Bevill  APRN, MSN, FNP-C [facility] [address]  [place], [place] [address] [phone]  

## 2021-03-13 ENCOUNTER — Telehealth: Payer: Self-pay

## 2021-03-13 ENCOUNTER — Other Ambulatory Visit: Payer: Self-pay | Admitting: Family Medicine

## 2021-03-13 DIAGNOSIS — G894 Chronic pain syndrome: Secondary | ICD-10-CM

## 2021-03-13 MED ORDER — OXYCODONE HCL 10 MG PO TABS: 10.0000 mg | ORAL_TABLET | ORAL | 0 refills | Status: DC | PRN

## 2021-03-13 NOTE — Telephone Encounter (Signed)
Drug: oxyCODONE HCl 10MG  tablets  Approved:TODAY PA Case: [id number], Status: Approved, Coverage Starts on: 03/13/2021 12:00:00 AM, Coverage Ends on: 09/09/2021 12:00:00 AM.

## 2021-03-13 NOTE — Progress Notes (Signed)
Reviewed PDMP substance reporting system prior to prescribing opiate medications. No inconsistencies noted.  Meds ordered this encounter  Medications   Oxycodone HCl 10 MG TABS    Sig: Take 1 tablet (10 mg total) by mouth every 4 (four) hours as needed.    Dispense:  90 tablet    Refill:  0    Order Specific Question:   Supervising Provider    Answer:   JEGEDE, OLUGBEMIGA E [[id number]]   [name] Merida Moore Oliviah Agostini  APRN, MSN, FNP-C [facility] [address]  [place], [place] [address] [phone]  

## 2021-03-13 NOTE — Telephone Encounter (Signed)
Pt is stating can you please send her Oxycodone 10 mg to [facility] [address] so she can you pocket money  [facility] need[facility] and she doesn't want to wait.

## 2021-03-22 ENCOUNTER — Telehealth: Payer: Self-pay

## 2021-03-22 DIAGNOSIS — D571 Sickle-cell disease without crisis: Secondary | ICD-10-CM

## 2021-03-22 NOTE — Telephone Encounter (Signed)
Ibuprofen 600

## 2021-03-23 MED ORDER — IBUPROFEN 600 MG PO TABS
ORAL_TABLET | ORAL | 2 refills | Status: DC
Start: 2021-03-23 — End: 2021-07-23

## 2021-03-23 NOTE — Telephone Encounter (Signed)
Rx refilled.

## 2021-03-27 ENCOUNTER — Inpatient Hospital Stay (HOSPITAL_COMMUNITY)
Admission: EM | Admit: 2021-03-27 | Discharge: 2021-04-11 | DRG: 981 | Disposition: A | Discharge status: Home Health | Payer: Medicaid Other | Attending: Internal Medicine | Admitting: Internal Medicine

## 2021-03-27 DIAGNOSIS — E876 Hypokalemia: Secondary | ICD-10-CM | POA: Diagnosis not present

## 2021-03-27 DIAGNOSIS — I878 Other specified disorders of veins: Secondary | ICD-10-CM | POA: Diagnosis present

## 2021-03-27 DIAGNOSIS — Z681 Body mass index (BMI) 19 or less, adult: Secondary | ICD-10-CM

## 2021-03-27 DIAGNOSIS — M25071 Hemarthrosis, right ankle: Secondary | ICD-10-CM | POA: Diagnosis not present

## 2021-03-27 DIAGNOSIS — R64 Cachexia: Secondary | ICD-10-CM | POA: Diagnosis present

## 2021-03-27 DIAGNOSIS — Z832 Family history of diseases of the blood and blood-forming organs and certain disorders involving the immune mechanism: Secondary | ICD-10-CM

## 2021-03-27 DIAGNOSIS — L97929 Non-pressure chronic ulcer of unspecified part of left lower leg with unspecified severity: Secondary | ICD-10-CM | POA: Diagnosis present

## 2021-03-27 DIAGNOSIS — D649 Anemia, unspecified: Secondary | ICD-10-CM

## 2021-03-27 DIAGNOSIS — D5700 Hb-SS disease with crisis, unspecified: Principal | ICD-10-CM | POA: Diagnosis present

## 2021-03-27 DIAGNOSIS — R5381 Other malaise: Secondary | ICD-10-CM | POA: Diagnosis present

## 2021-03-27 DIAGNOSIS — I83213 Varicose veins of right lower extremity with both ulcer of ankle and inflammation: Secondary | ICD-10-CM | POA: Diagnosis present

## 2021-03-27 DIAGNOSIS — L97319 Non-pressure chronic ulcer of right ankle with unspecified severity: Secondary | ICD-10-CM | POA: Diagnosis present

## 2021-03-27 DIAGNOSIS — L97919 Non-pressure chronic ulcer of unspecified part of right lower leg with unspecified severity: Secondary | ICD-10-CM | POA: Diagnosis present

## 2021-03-27 DIAGNOSIS — Z79899 Other long term (current) drug therapy: Secondary | ICD-10-CM

## 2021-03-27 DIAGNOSIS — L089 Local infection of the skin and subcutaneous tissue, unspecified: Secondary | ICD-10-CM

## 2021-03-27 DIAGNOSIS — Z20822 Contact with and (suspected) exposure to covid-19: Secondary | ICD-10-CM | POA: Diagnosis present

## 2021-03-27 DIAGNOSIS — R000 Tachycardia, unspecified: Secondary | ICD-10-CM | POA: Diagnosis not present

## 2021-03-27 DIAGNOSIS — L03116 Cellulitis of left lower limb: Secondary | ICD-10-CM | POA: Diagnosis present

## 2021-03-27 DIAGNOSIS — Z833 Family history of diabetes mellitus: Secondary | ICD-10-CM

## 2021-03-27 DIAGNOSIS — I83223 Varicose veins of left lower extremity with both ulcer of ankle and inflammation: Secondary | ICD-10-CM | POA: Diagnosis present

## 2021-03-27 DIAGNOSIS — E43 Unspecified severe protein-calorie malnutrition: Secondary | ICD-10-CM | POA: Insufficient documentation

## 2021-03-27 DIAGNOSIS — R262 Difficulty in walking, not elsewhere classified: Secondary | ICD-10-CM | POA: Diagnosis not present

## 2021-03-27 DIAGNOSIS — G894 Chronic pain syndrome: Secondary | ICD-10-CM | POA: Diagnosis present

## 2021-03-27 DIAGNOSIS — Z8249 Family history of ischemic heart disease and other diseases of the circulatory system: Secondary | ICD-10-CM

## 2021-03-27 DIAGNOSIS — T148XXA Other injury of unspecified body region, initial encounter: Secondary | ICD-10-CM

## 2021-03-27 DIAGNOSIS — Z825 Family history of asthma and other chronic lower respiratory diseases: Secondary | ICD-10-CM

## 2021-03-27 MED ORDER — PIPERACILLIN-TAZOBACTAM 3.375 G IVPB 30 MIN
3.3750 g | Freq: Once | INTRAVENOUS | Status: AC
  Administered 2021-03-28: 3.375 g via INTRAVENOUS
  Filled 2021-03-27: qty 50

## 2021-03-27 MED ORDER — HYDROMORPHONE HCL 1 MG/ML IJ SOLN
2.0000 mg | INTRAMUSCULAR | Status: AC
  Administered 2021-03-27: 2 mg via INTRAVENOUS

## 2021-03-27 MED ORDER — DEXTROSE-NACL 5-0.45 % IV SOLN: INTRAVENOUS | Status: DC

## 2021-03-27 MED ORDER — HYDROMORPHONE HCL 1 MG/ML IJ SOLN
2.0000 mg | INTRAMUSCULAR | Status: AC
  Administered 2021-03-28: 2 mg via INTRAVENOUS
  Filled 2021-03-27 (×2): qty 2

## 2021-03-27 NOTE — ED Provider Notes (Signed)
[facility] EMERGENCY DEPARTMENT Provider Note  CSN: [id number] Arrival date & time: 03/27/21 2310  Chief Complaint(s) No chief complaint on file.  HPI [name] is a 50 y.o. female with a past medical history listed below including chronic venous stasis wounds in the lower extremities here for worsening pain related to the wounds.   She complains mostly of the left lower extremity wounds which are severely painful and have gradually worsened over the past several weeks.  Left lower extremity wounds have been there for several months. Wounds on left have been bleeding. EMS noted approx 100cc of blood on scene. No active bleeding currently. Right lower extremity wounds have been there for several years.   Pain not controlled with home medications including oxycodone.  Patient denies any other physical complaints.  Per EMS patient was tachycardic, tachypneic and febrile (no temperature reported)  She was previously being followed by wound clinic but on record review the last note from the wound clinic was December 2021. HPI  Past Medical History Past Medical History:  Diagnosis Date   Anemia 03/30/2012   Hx of sickle cell disease   CAP (community acquired pneumonia)    2014   Cholelithiasis 03/30/2012   Chronic wound of extremity 04/01/2012   Elevated LFTs    Leg ulcer ([place]) 10/27/2012   Chronic under care of wound clinic   Multiple open wounds of lower extremity    chronic wounds B/LLE   Sickle cell disease ([place])    Sinus bradycardia by electrocardiogram 04/03/2012   Patient Active Problem List   Diagnosis Date Noted   Infected wound 03/28/2021   Symptomatic anemia 05/01/2020   At risk for seizures due to oxygen toxicity 02/09/2020   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   Venous stasis 01/19/2020   Partial loss of skin graft 01/12/2020   Acute respiratory failure with hypoxia ([place]) 12/15/2019   Bone infarction of distal tibia, 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    Sickle cell anemia with crisis ([place]) 09/03/2018   Other chronic pain    Sickle cell crisis ([place]) 06/26/2018   Sickle cell pain crisis ([place]) 04/07/2018   Avascular necrosis of bone of hip, left ([place]) 12/08/2017   Avascular necrosis of bone of hip, right ([place]) 12/08/2017   Hb-SS disease without crisis ([place]) 11/26/2017   Anemia of chronic disease 11/26/2017   Abnormal quad screen    Ringworm of body 04/05/2017   Ulcer of right lower extremity ([place])    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 Authorizing Provider  Amino Acids-Protein Hydrolys (FEEDING SUPPLEMENT, PRO-STAT 64,) LIQD Take 30 mLs by mouth 3 (three) times daily with meals. 03/02/19   [name], FNP  Deferasirox (JADENU) 360 MG TABS Take 1 tablet (360 mg total) by mouth daily. 07/21/19   [name], FNP  folic acid (FOLVITE) 1 MG tablet Take 5 tablets (5 mg total) by mouth daily. 01/15/17   Anyanwu, Sallyanne Havers, MD  ibuprofen (ADVIL) 600 MG tablet TAKE 1 TABLET(600 MG) BY MOUTH EVERY 6 HOURS 03/23/21   [name], FNP  Oxycodone HCl 10 MG TABS Take 1 tablet (10 mg total) by mouth every 4 (four) hours as needed. 03/13/21   [name], FNP  Past Surgical History Past Surgical History:  Procedure Laterality Date   ALLOGRAFT APPLICATION Right 4/69/6295   Procedure: SURGICAL PREP FOR GRAFTING RIGHT LOWER EXTREMITY AND APPLICATION OF Jannifer Hick;  Surgeon: Irene Limbo, MD;  Location: [facility];  Service: Plastics;  Laterality: Right;   APPLICATION OF A-CELL OF EXTREMITY Right  10/09/2015   Procedure: APPLICATION OF Jannifer Hick;  Surgeon: Irene Limbo, MD;  Location: [facility];  Service: Plastics;  Laterality: Right;   BREAST SURGERY     left breast cyst aspiration   CESAREAN SECTION     CESAREAN SECTION N/A 01/06/2014   Procedure: CESAREAN SECTION;  Surgeon: Emily Filbert, MD;  Location: [facility];  Service: Obstetrics;  Laterality: N/A;   CESAREAN SECTION N/A 05/04/2017   Procedure: CESAREAN SECTION;  Surgeon: Woodroe Mode, MD;  Location: [facility];  Service: Obstetrics;  Laterality: N/A;   I & D EXTREMITY Right 10/09/2015   Procedure: SURGICAL PREPARATION FOR GRAFTING RIGHT ANKLE AND APPLICATION THERASKIN;  Surgeon: Irene Limbo, MD;  Location: [facility];  Service: Plastics;  Laterality: Right;   SKIN FULL THICKNESS GRAFT Bilateral 06/17/2016   Procedure: SURGICAL PREP FOR GRAFTING, BILATERAL LOWER EXTREMITIES AND APPLICATION OF Jannifer Hick;  Surgeon: Irene Limbo, MD;  Location: [facility];  Service: Plastics;  Laterality: Bilateral;   TONSILLECTOMY     Family History Family History  Problem Relation Age of Onset   Sickle cell anemia Sister    Diabetes Mother    Asthma Mother    Sickle cell trait Mother    Sickle cell trait Father    Hypertension Father     Social History Social History   Tobacco Use   Smoking status: Never   Smokeless tobacco: Never  Vaping Use   Vaping Use: Never used  Substance Use Topics   Alcohol use: No   Drug use: No   Allergies Patient has no known allergies.  Review of Systems Review of Systems All other systems are reviewed and are negative for acute change except as noted in the HPI  Physical Exam Vital Signs  I have reviewed the triage vital signs BP (!) 168/90 (BP Location: Right Arm)   Pulse (!) 111   Temp 97.6 F (36.4 C) (Oral)   Resp (!) 23   SpO2 98%   Physical Exam Vitals reviewed.  Constitutional:      General: She is not in acute distress.     Appearance: She is well-developed. She is not diaphoretic.     Comments: In obvious discomfort  HENT:     Head: Normocephalic and atraumatic.     Right Ear: External ear normal.     Left Ear: External ear normal.     Nose: Nose normal.  Eyes:     General: No scleral icterus.       Right eye: No discharge.        Left eye: No discharge.     Conjunctiva/sclera: Conjunctivae normal.     Pupils: Pupils are equal, round, and reactive to light.  Neck:     Trachea: Phonation normal.  Cardiovascular:     Rate and Rhythm: Normal rate and regular rhythm.     Heart sounds: No murmur heard.   No friction rub. No gallop.  Pulmonary:     Effort: Pulmonary effort is normal. No respiratory distress.     Breath sounds: Normal breath sounds. No stridor. No rales.  Abdominal:     General: There is no  distension.     Palpations: Abdomen is soft.     Tenderness: There is no abdominal tenderness.  Musculoskeletal:        General: No tenderness. Normal range of motion.     Cervical back: Normal range of motion and neck supple.  Skin:    General: Skin is warm and dry.     Findings: Wound present. No erythema or rash.     Comments: See images  Neurological:     Mental Status: She is alert and oriented to person, place, and time.  Psychiatric:        Behavior: Behavior normal.           ED Results and Treatments Labs (all labs ordered are listed, but only abnormal results are displayed) Labs Reviewed  LACTIC ACID, PLASMA - Abnormal; Notable for the following components:      Result Value   Lactic Acid, Venous 2.1 (*)    All other components within normal limits  CBC WITH DIFFERENTIAL/PLATELET - Abnormal; Notable for the following components:   WBC 19.5 (*)    RBC 1.46 (*)    Hemoglobin 4.7 (*)    HCT 13.7 (*)    RDW 24.8 (*)    nRBC 11.4 (*)    Neutro Abs 14.1 (*)    Monocytes Absolute 2.0 (*)    Basophils Absolute 0.2 (*)    Abs Immature Granulocytes 0.16 (*)    All other  components within normal limits  RETICULOCYTES - Abnormal; Notable for the following components:   Retic Ct Pct 28.2 (*)    RBC. 1.48 (*)    Retic Count, Absolute 397.8 (*)    Immature Retic Fract 46.4 (*)    All other components within normal limits  COMPREHENSIVE METABOLIC PANEL - Abnormal; Notable for the following components:   Glucose, Bld 131 (*)    Calcium 8.2 (*)    Total Bilirubin 3.2 (*)    All other components within normal limits  RESP PANEL BY RT-PCR (FLU A&B, COVID) ARPGX2  CULTURE, BLOOD (ROUTINE X 2)  CULTURE, BLOOD (ROUTINE X 2)  PROTIME-INR  LACTIC ACID, PLASMA  PATHOLOGIST SMEAR REVIEW  I-STAT BETA HCG BLOOD, ED ([facility], [facility], [facility] ONLY)  TYPE AND SCREEN  PREPARE RBC (CROSSMATCH)                                                                                                                         EKG  EKG Interpretation  Date/Time:  Wednesday March 28 2021 00:42:33 EST Ventricular Rate:  96 PR Interval:  152 QRS Duration: 89 QT Interval:  371 QTC Calculation: 469 R Axis:   68 Text Interpretation: Sinus rhythm Minimal ST depression, diffuse leads No acute changes Confirmed by Addison Lank [phone]) on 03/28/2021 12:47:50 AM       Radiology DG Ankle 2 Views Left  Result Date: 03/28/2021 CLINICAL DATA:  Ankle wounds EXAM: LEFT ANKLE - 2 VIEW COMPARISON:  None. FINDINGS: No fracture or  dislocation is seen. The joint spaces are preserved. Large soft tissue wound along the posterior aspect of the ankle. No associated cortical destruction to suggest acute osteomyelitis. IMPRESSION: Large soft tissue along the posterior aspect of the ankle. No radiographic findings to suggest acute osteomyelitis. Electronically Signed   By: Julian Hy M.D.   On: 03/28/2021 00:16   DG Ankle 2 Views Right  Result Date: 03/28/2021 CLINICAL DATA:  Ankle wounds EXAM: RIGHT ANKLE - 2 VIEW COMPARISON:  None. FINDINGS: No fracture or dislocation is seen. The joint spaces are  preserved. Soft tissue wounds along the posterolateral ankle bilaterally. Mild periosteal reaction along the distal fibula and tibial shaft suggests the possibility of chronic osteomyelitis. No acute osseous destruction. IMPRESSION: Soft tissue wounds along the bilateral ankle. Associated periosteal reaction raises the possibility of chronic osteomyelitis. No acute osseous destruction. Electronically Signed   By: Julian Hy M.D.   On: 03/28/2021 00:18    Pertinent labs & imaging results that were available during my care of the patient were reviewed by me and considered in my medical decision making (see MDM for details).  Medications Ordered in ED Medications  dextrose 5 %-0.45 % sodium chloride infusion ( Intravenous New Bag/Given 03/28/21 0013)  0.9 %  sodium chloride infusion (has no administration in time range)  sodium chloride 0.9 % bolus 1,000 mL (has no administration in time range)  HYDROmorphone (DILAUDID) injection 2 mg (2 mg Intravenous Given 03/28/21 0030)  HYDROmorphone (DILAUDID) injection 2 mg (2 mg Intravenous Given 03/27/21 2350)  piperacillin-tazobactam (ZOSYN) IVPB 3.375 g (0 g Intravenous Stopped 03/28/21 0108)                                                                                                                                     Procedures .1-3 Lead EKG Interpretation Performed by: Fatima Blank, MD Authorized by: Fatima Blank, MD     Interpretation: abnormal     ECG rate:  111   ECG rate assessment: tachycardic     Rhythm: sinus tachycardia     Ectopy: none     Conduction: normal   .Critical Care Performed by: Fatima Blank, MD Authorized by: Fatima Blank, MD   Critical care provider statement:    Critical care time (minutes):  45   Critical care time was exclusive of:  Separately billable procedures and treating other patients   Critical care was necessary to treat or prevent imminent or life-threatening  deterioration of the following conditions:  Sepsis and circulatory failure   Critical care was time spent personally by me on the following activities:  Development of treatment plan with patient or surrogate, discussions with consultants, evaluation of patient's response to treatment, examination of patient, obtaining history from patient or surrogate, review of old charts, re-evaluation of patient's condition, pulse oximetry, ordering and review of radiographic studies, ordering and review of laboratory studies and ordering and  performing treatments and interventions   Care discussed with: admitting provider    (including critical care time)  Medical Decision Making / ED Course I have reviewed the nursing notes for this encounter and the patient's prior records (if available in EHR or on provided paperwork).  Annelie Boak was evaluated in Emergency Department on 03/28/2021 for the symptoms described in the history of present illness. She was evaluated in the context of the global COVID-19 pandemic, which necessitated consideration that the patient might be at risk for infection with the SARS-CoV-2 virus that causes COVID-19. Institutional protocols and algorithms that pertain to the evaluation of patients at risk for COVID-19 are in a state of rapid change based on information released by regulatory bodies including the CDC and federal and state organizations. These policies and algorithms were followed during the patient's care in the ED.     Patient presents with pain related to chronic bilateral lower extremity wounds left greater than right.   Right ankle wounds have bluish-green discharge concerning for pseudomonal infection.  Left ankle wounds are deep and will need to be worked up for bony involvement/osteomyelitis.  Screening labs and imaging ordered. IVF and pain medicine given.  Pertinent labs & imaging results that were available during my care of the patient were reviewed by me and  considered in my medical decision making:  Plain films of the lower extremities revealed likely chronic osteomyelitis of the right lower extremity.  No evidence of bony involvement on the left.. Labs notable for leukocytosis and elevated lactic acid. She was started on IVF and empiric antibiotics (Zosyn) to treat for possible pseudomonal infection.  Additionally labs revealed anemia with a hemoglobin of 4.7.  Patient's baseline is above 5. Will provide blood transfusion.  Admitted to hospitalist service for further management.  Final Clinical Impression(s) / ED Diagnoses Final diagnoses:  Complicated wound infection  Low hemoglobin     This chart was dictated using voice recognition software.  Despite best efforts to proofread,  errors can occur which can change the documentation meaning.    Fatima Blank, MD 03/28/21 0201

## 2021-03-27 NOTE — ED Triage Notes (Signed)
EMS arrival. Came from home. Pt AxO4. Left ankle wound noted. Right ankle wound noted as well. Sickle Cell. Hypertensive, tachypnea, tachycardiac, febrile, per EMS.

## 2021-03-28 ENCOUNTER — Encounter (HOSPITAL_COMMUNITY): Payer: Self-pay | Admitting: Family Medicine

## 2021-03-28 ENCOUNTER — Emergency Department (HOSPITAL_COMMUNITY): Payer: Medicaid Other

## 2021-03-28 ENCOUNTER — Other Ambulatory Visit: Payer: Self-pay

## 2021-03-28 ENCOUNTER — Inpatient Hospital Stay (HOSPITAL_COMMUNITY): Payer: Medicaid Other

## 2021-03-28 DIAGNOSIS — I878 Other specified disorders of veins: Secondary | ICD-10-CM | POA: Diagnosis present

## 2021-03-28 DIAGNOSIS — E43 Unspecified severe protein-calorie malnutrition: Secondary | ICD-10-CM | POA: Diagnosis present

## 2021-03-28 DIAGNOSIS — L97923 Non-pressure chronic ulcer of unspecified part of left lower leg with necrosis of muscle: Secondary | ICD-10-CM

## 2021-03-28 DIAGNOSIS — R262 Difficulty in walking, not elsewhere classified: Secondary | ICD-10-CM | POA: Diagnosis not present

## 2021-03-28 DIAGNOSIS — Z833 Family history of diabetes mellitus: Secondary | ICD-10-CM | POA: Diagnosis not present

## 2021-03-28 DIAGNOSIS — Z7952 Long term (current) use of systemic steroids: Secondary | ICD-10-CM | POA: Diagnosis not present

## 2021-03-28 DIAGNOSIS — E876 Hypokalemia: Secondary | ICD-10-CM | POA: Diagnosis not present

## 2021-03-28 DIAGNOSIS — L97929 Non-pressure chronic ulcer of unspecified part of left lower leg with unspecified severity: Secondary | ICD-10-CM | POA: Diagnosis present

## 2021-03-28 DIAGNOSIS — D5700 Hb-SS disease with crisis, unspecified: Secondary | ICD-10-CM | POA: Diagnosis present

## 2021-03-28 DIAGNOSIS — R000 Tachycardia, unspecified: Secondary | ICD-10-CM | POA: Diagnosis not present

## 2021-03-28 DIAGNOSIS — L0390 Cellulitis, unspecified: Secondary | ICD-10-CM | POA: Diagnosis not present

## 2021-03-28 DIAGNOSIS — D649 Anemia, unspecified: Secondary | ICD-10-CM | POA: Diagnosis not present

## 2021-03-28 DIAGNOSIS — M25071 Hemarthrosis, right ankle: Secondary | ICD-10-CM | POA: Diagnosis not present

## 2021-03-28 DIAGNOSIS — R58 Hemorrhage, not elsewhere classified: Secondary | ICD-10-CM | POA: Diagnosis not present

## 2021-03-28 DIAGNOSIS — Z825 Family history of asthma and other chronic lower respiratory diseases: Secondary | ICD-10-CM | POA: Diagnosis not present

## 2021-03-28 DIAGNOSIS — Z8249 Family history of ischemic heart disease and other diseases of the circulatory system: Secondary | ICD-10-CM | POA: Diagnosis not present

## 2021-03-28 DIAGNOSIS — T148XXA Other injury of unspecified body region, initial encounter: Secondary | ICD-10-CM | POA: Diagnosis present

## 2021-03-28 DIAGNOSIS — G894 Chronic pain syndrome: Secondary | ICD-10-CM | POA: Diagnosis present

## 2021-03-28 DIAGNOSIS — L03116 Cellulitis of left lower limb: Secondary | ICD-10-CM | POA: Diagnosis present

## 2021-03-28 DIAGNOSIS — R64 Cachexia: Secondary | ICD-10-CM | POA: Diagnosis present

## 2021-03-28 DIAGNOSIS — Z79899 Other long term (current) drug therapy: Secondary | ICD-10-CM | POA: Diagnosis not present

## 2021-03-28 DIAGNOSIS — Z832 Family history of diseases of the blood and blood-forming organs and certain disorders involving the immune mechanism: Secondary | ICD-10-CM | POA: Diagnosis not present

## 2021-03-28 DIAGNOSIS — L97913 Non-pressure chronic ulcer of unspecified part of right lower leg with necrosis of muscle: Secondary | ICD-10-CM | POA: Diagnosis not present

## 2021-03-28 DIAGNOSIS — L97919 Non-pressure chronic ulcer of unspecified part of right lower leg with unspecified severity: Secondary | ICD-10-CM | POA: Diagnosis present

## 2021-03-28 DIAGNOSIS — L97319 Non-pressure chronic ulcer of right ankle with unspecified severity: Secondary | ICD-10-CM | POA: Diagnosis present

## 2021-03-28 DIAGNOSIS — Z20822 Contact with and (suspected) exposure to covid-19: Secondary | ICD-10-CM | POA: Diagnosis present

## 2021-03-28 DIAGNOSIS — D57419 Sickle-cell thalassemia with crisis, unspecified: Secondary | ICD-10-CM | POA: Diagnosis not present

## 2021-03-28 DIAGNOSIS — I83213 Varicose veins of right lower extremity with both ulcer of ankle and inflammation: Secondary | ICD-10-CM | POA: Diagnosis present

## 2021-03-28 DIAGNOSIS — R5381 Other malaise: Secondary | ICD-10-CM | POA: Diagnosis present

## 2021-03-28 DIAGNOSIS — Z681 Body mass index (BMI) 19 or less, adult: Secondary | ICD-10-CM | POA: Diagnosis not present

## 2021-03-28 DIAGNOSIS — I83223 Varicose veins of left lower extremity with both ulcer of ankle and inflammation: Secondary | ICD-10-CM | POA: Diagnosis present

## 2021-03-28 LAB — CBC WITH DIFFERENTIAL/PLATELET
Abs Immature Granulocytes: 0.16 10*3/uL — ABNORMAL HIGH (ref 0.00–0.07)
Basophils Absolute: 0.2 10*3/uL — ABNORMAL HIGH (ref 0.0–0.1)
Basophils Relative: 1 %
Eosinophils Absolute: 0.3 10*3/uL (ref 0.0–0.5)
Eosinophils Relative: 1 %
HCT: 13.7 % — ABNORMAL LOW (ref 36.0–46.0)
Hemoglobin: 4.7 g/dL — CL (ref 12.0–15.0)
Immature Granulocytes: 1 %
Lymphocytes Relative: 14 %
Lymphs Abs: 2.8 10*3/uL (ref 0.7–4.0)
MCH: 32.2 pg (ref 26.0–34.0)
MCHC: 34.3 g/dL (ref 30.0–36.0)
MCV: 93.8 fL (ref 80.0–100.0)
Monocytes Absolute: 2 10*3/uL — ABNORMAL HIGH (ref 0.1–1.0)
Monocytes Relative: 11 %
Neutro Abs: 14.1 10*3/uL — ABNORMAL HIGH (ref 1.7–7.7)
Neutrophils Relative %: 72 %
Platelets: 369 10*3/uL (ref 150–400)
RBC: 1.46 MIL/uL — ABNORMAL LOW (ref 3.87–5.11)
RDW: 24.8 % — ABNORMAL HIGH (ref 11.5–15.5)
WBC: 19.5 10*3/uL — ABNORMAL HIGH (ref 4.0–10.5)
nRBC: 11.4 % — ABNORMAL HIGH (ref 0.0–0.2)

## 2021-03-28 LAB — CBC
HCT: 22.2 % — ABNORMAL LOW (ref 36.0–46.0)
Hemoglobin: 7.9 g/dL — ABNORMAL LOW (ref 12.0–15.0)
MCH: 31.2 pg (ref 26.0–34.0)
MCHC: 35.6 g/dL (ref 30.0–36.0)
MCV: 87.7 fL (ref 80.0–100.0)
Platelets: 345 10*3/uL (ref 150–400)
RBC: 2.53 MIL/uL — ABNORMAL LOW (ref 3.87–5.11)
RDW: 20.5 % — ABNORMAL HIGH (ref 11.5–15.5)
WBC: 26.2 10*3/uL — ABNORMAL HIGH (ref 4.0–10.5)
nRBC: 7.3 % — ABNORMAL HIGH (ref 0.0–0.2)

## 2021-03-28 LAB — PROTIME-INR
INR: 1.2 (ref 0.8–1.2)
Prothrombin Time: 14.8 seconds (ref 11.4–15.2)

## 2021-03-28 LAB — RETICULOCYTES
Immature Retic Fract: 46.4 % — ABNORMAL HIGH (ref 2.3–15.9)
RBC.: 1.48 MIL/uL — ABNORMAL LOW (ref 3.87–5.11)
Retic Count, Absolute: 397.8 10*3/uL — ABNORMAL HIGH (ref 19.0–186.0)
Retic Ct Pct: 28.2 % — ABNORMAL HIGH (ref 0.4–3.1)

## 2021-03-28 LAB — LACTATE DEHYDROGENASE: LDH: 384 U/L — ABNORMAL HIGH (ref 98–192)

## 2021-03-28 LAB — COMPREHENSIVE METABOLIC PANEL
ALT: 21 U/L (ref 0–44)
AST: 39 U/L (ref 15–41)
Albumin: 3.7 g/dL (ref 3.5–5.0)
Alkaline Phosphatase: 58 U/L (ref 38–126)
Anion gap: 8 (ref 5–15)
BUN: 11 mg/dL (ref 6–20)
CO2: 23 mmol/L (ref 22–32)
Calcium: 8.2 mg/dL — ABNORMAL LOW (ref 8.9–10.3)
Chloride: 105 mmol/L (ref 98–111)
Creatinine, Ser: 0.69 mg/dL (ref 0.44–1.00)
GFR, Estimated: >60 mL/min (ref >60)
Glucose, Bld: 131 mg/dL — ABNORMAL HIGH (ref 70–99)
Potassium: 3.6 mmol/L (ref 3.5–5.1)
Sodium: 136 mmol/L (ref 135–145)
Total Bilirubin: 3.2 mg/dL — ABNORMAL HIGH (ref 0.3–1.2)
Total Protein: 7.1 g/dL (ref 6.5–8.1)

## 2021-03-28 LAB — HEMOGLOBIN AND HEMATOCRIT, BLOOD
HCT: 16.5 % — ABNORMAL LOW (ref 36.0–46.0)
Hemoglobin: 5.8 g/dL — CL (ref 12.0–15.0)

## 2021-03-28 LAB — RESP PANEL BY RT-PCR (FLU A&B, COVID) ARPGX2
Influenza A by PCR: NEGATIVE
Influenza B by PCR: NEGATIVE
SARS Coronavirus 2 by RT PCR: NEGATIVE

## 2021-03-28 LAB — LACTIC ACID, PLASMA
Lactic Acid, Venous: 2.1 mmol/L (ref 0.5–1.9)
Lactic Acid, Venous: 1.9 mmol/L (ref 0.5–1.9)

## 2021-03-28 LAB — PATHOLOGIST SMEAR REVIEW

## 2021-03-28 LAB — I-STAT BETA HCG BLOOD, ED (MC, WL, AP ONLY): I-stat hCG, quantitative: <5 m[IU]/mL (ref <5)

## 2021-03-28 LAB — PREPARE RBC (CROSSMATCH)

## 2021-03-28 MED ORDER — ENOXAPARIN SODIUM 40 MG/0.4ML IJ SOSY
40.0000 mg | PREFILLED_SYRINGE | Freq: Every day | INTRAMUSCULAR | Status: DC
  Filled 2021-03-28 (×3): qty 0.4

## 2021-03-28 MED ORDER — SODIUM CHLORIDE 0.9% IV SOLUTION: Freq: Once | INTRAVENOUS | Status: AC

## 2021-03-28 MED ORDER — OXYCODONE HCL 5 MG PO TABS
10.0000 mg | ORAL_TABLET | ORAL | Status: DC | PRN
  Administered 2021-03-28 – 2021-04-02 (×15): 10 mg via ORAL
  Filled 2021-03-28 (×16): qty 2

## 2021-03-28 MED ORDER — SENNOSIDES-DOCUSATE SODIUM 8.6-50 MG PO TABS
1.0000 | ORAL_TABLET | Freq: Two times a day (BID) | ORAL | Status: DC
  Administered 2021-03-28 – 2021-04-11 (×28): 1 via ORAL
  Filled 2021-03-28 (×28): qty 1

## 2021-03-28 MED ORDER — POLYETHYLENE GLYCOL 3350 17 G PO PACK: 17.0000 g | PACK | Freq: Every day | ORAL | Status: DC | PRN

## 2021-03-28 MED ORDER — HYDROMORPHONE HCL 1 MG/ML IJ SOLN
1.5000 mg | INTRAMUSCULAR | Status: AC | PRN
  Administered 2021-03-28 (×3): 1.5 mg via INTRAVENOUS
  Filled 2021-03-28 (×3): qty 2

## 2021-03-28 MED ORDER — HYDROMORPHONE 1 MG/ML IV SOLN
INTRAVENOUS | Status: DC
  Administered 2021-03-28: 1.5 mg via INTRAVENOUS
  Administered 2021-03-28: 5 mg via INTRAVENOUS
  Administered 2021-03-28: 30 mg via INTRAVENOUS
  Administered 2021-03-28: 2.7 mg via INTRAVENOUS
  Administered 2021-03-29: 7 mg via INTRAVENOUS
  Administered 2021-03-29: 3.5 mg via INTRAVENOUS
  Administered 2021-03-29: 4 mg via INTRAVENOUS
  Administered 2021-03-29: 2 mg via INTRAVENOUS
  Administered 2021-03-29: 5.5 mg via INTRAVENOUS
  Administered 2021-03-29: 30 mg via INTRAVENOUS
  Administered 2021-03-29 – 2021-03-30 (×2): 4 mg via INTRAVENOUS
  Administered 2021-03-30: 5 mg via INTRAVENOUS
  Administered 2021-03-30: 3.5 mg via INTRAVENOUS
  Administered 2021-03-30: 30 mg via INTRAVENOUS
  Administered 2021-03-30: 4.5 mg via INTRAVENOUS
  Administered 2021-03-31: 30 mg via INTRAVENOUS
  Administered 2021-03-31: 9.5 mg via INTRAVENOUS
  Administered 2021-03-31: 8 mg via INTRAVENOUS
  Administered 2021-03-31: 4 mg via INTRAVENOUS
  Administered 2021-03-31: 6 mg via INTRAVENOUS
  Administered 2021-04-01: 7 mg via INTRAVENOUS
  Administered 2021-04-01: 6 mg via INTRAVENOUS
  Administered 2021-04-01: 1.5 mg via INTRAVENOUS
  Administered 2021-04-01: 3 mg via INTRAVENOUS
  Administered 2021-04-01: 30 mg via INTRAVENOUS
  Administered 2021-04-01: 3 mg via INTRAVENOUS
  Administered 2021-04-01: 7.5 mg via INTRAVENOUS
  Administered 2021-04-02: 3.09 mg via INTRAVENOUS
  Administered 2021-04-02: 6.2 mg via INTRAVENOUS
  Administered 2021-04-02: 3.5 mg via INTRAVENOUS
  Filled 2021-03-28 (×5): qty 30

## 2021-03-28 MED ORDER — SODIUM CHLORIDE 0.9 % IV SOLN
25.0000 mg | INTRAVENOUS | Status: DC | PRN
  Filled 2021-03-28 (×2): qty 0.5

## 2021-03-28 MED ORDER — HYDROMORPHONE HCL 1 MG/ML IJ SOLN
2.0000 mg | INTRAMUSCULAR | Status: DC | PRN
  Administered 2021-03-28 (×2): 2 mg via INTRAVENOUS
  Filled 2021-03-28 (×2): qty 2

## 2021-03-28 MED ORDER — PIPERACILLIN-TAZOBACTAM 3.375 G IVPB
3.3750 g | Freq: Three times a day (TID) | INTRAVENOUS | Status: DC
  Administered 2021-03-28 – 2021-03-29 (×3): 3.375 g via INTRAVENOUS
  Filled 2021-03-28 (×4): qty 50

## 2021-03-28 MED ORDER — KETOROLAC TROMETHAMINE 15 MG/ML IJ SOLN
15.0000 mg | Freq: Four times a day (QID) | INTRAMUSCULAR | Status: AC
  Administered 2021-03-28 – 2021-04-01 (×18): 15 mg via INTRAVENOUS
  Filled 2021-03-28 (×19): qty 1

## 2021-03-28 MED ORDER — SODIUM CHLORIDE 0.45 % IV SOLN: INTRAVENOUS | Status: AC

## 2021-03-28 MED ORDER — ONDANSETRON HCL 4 MG/2ML IJ SOLN: 4.0000 mg | Freq: Four times a day (QID) | INTRAMUSCULAR | Status: DC | PRN

## 2021-03-28 MED ORDER — SODIUM CHLORIDE 0.9 % IV BOLUS
1000.0000 mL | Freq: Once | INTRAVENOUS | Status: AC
  Administered 2021-03-28: 1000 mL via INTRAVENOUS

## 2021-03-28 MED ORDER — SODIUM CHLORIDE 0.9% FLUSH: 9.0000 mL | INTRAVENOUS | Status: DC | PRN

## 2021-03-28 MED ORDER — PIPERACILLIN-TAZOBACTAM 3.375 G IVPB
3.3750 g | Freq: Three times a day (TID) | INTRAVENOUS | Status: DC
  Administered 2021-03-28: 3.375 g via INTRAVENOUS
  Filled 2021-03-28: qty 50

## 2021-03-28 MED ORDER — DIPHENHYDRAMINE HCL 25 MG PO CAPS: 25.0000 mg | ORAL_CAPSULE | ORAL | Status: DC | PRN

## 2021-03-28 MED ORDER — NALOXONE HCL 0.4 MG/ML IJ SOLN: 0.4000 mg | INTRAMUSCULAR | Status: DC | PRN

## 2021-03-28 MED ORDER — SODIUM CHLORIDE 0.9 % IV SOLN
10.0000 mL/h | Freq: Once | INTRAVENOUS | Status: AC
  Administered 2021-03-28: 10 mL/h via INTRAVENOUS

## 2021-03-28 MED ORDER — ACETAMINOPHEN 325 MG PO TABS
650.0000 mg | ORAL_TABLET | ORAL | Status: DC | PRN
  Administered 2021-03-28 – 2021-03-31 (×2): 650 mg via ORAL
  Filled 2021-03-28 (×2): qty 2

## 2021-03-28 MED ORDER — HYDROMORPHONE HCL 1 MG/ML IJ SOLN
1.0000 mg | Freq: Once | INTRAMUSCULAR | Status: AC
  Administered 2021-03-28: 1 mg via INTRAVENOUS
  Filled 2021-03-28: qty 1

## 2021-03-28 NOTE — Progress Notes (Signed)
Pharmacy Antibiotic Note  [name] is a 50 y.o. female admitted on 03/27/2021 with  wound infection .  Pharmacy has been consulted for Zosyn dosing. WBC elevated. Renal function good.   Plan: Zosyn 3.375G IV q8h to be infused over 4 hours Trend WBC, temp, renal function  F/U infectious work-up   Temp (24hrs), Avg:97.6 F (36.4 C), Min:97.6 F (36.4 C), Max:97.6 F (36.4 C)  Recent Labs  Lab 03/27/21 2347 03/28/21 0017 03/28/21 0247  WBC  --  19.5*  --   CREATININE  --  0.69  --   LATICACIDVEN 2.1*  --  1.9    CrCl cannot be calculated (Unknown ideal weight.).    No Known Allergies  Narda Bonds, PharmD, BCPS Clinical Pharmacist Phone: [phone]

## 2021-03-28 NOTE — Progress Notes (Signed)
[name] has been completed.   Preliminary results in CV Proc.   [name] 03/28/2021 11:29 AM

## 2021-03-28 NOTE — ED Notes (Signed)
Pt placed on 2L [place]

## 2021-03-28 NOTE — Progress Notes (Signed)
PHARMACY NOTE -  [facility] has been assisting with dosing of Zosyn for wound infection. Dosage remains stable at 3.375 g IV q8 hr and further renal adjustments per institutional Pharmacy antibiotic protocol  Pharmacy will sign off, following peripherally for culture results or dose adjustments. Please reconsult if a change in clinical status warrants re-evaluation of dosage.  Reuel Boom, PharmD, BCPS [phone] 03/28/2021, 5:46 PM

## 2021-03-28 NOTE — H&P (Signed)
History and Physical    [name]:[id number] DOB: July 14, 1970 DOA: 03/27/2021  PCP: [name], FNP   Patient coming from: Home   Chief Complaint: Severe leg pain b/l Lt > Rt, leg ulcers   HPI: [name] is a pleasant 50 y.o. female with medical history significant for sickle cell anemia, chronic pain, and chronic leg ulcers, now presenting to the emergency department for evaluation of severe leg pain and worsening ulceration involving the lower left leg.  Patient reports history of lower right leg ulcers for several years and now ulcers involving the lower left leg for the past few months that has been worsening significantly.  Despite her home medications including oxycodone, she has developed worsening severe bilateral leg pain, left greater than right.  She denies any fevers, chills, chest pain, or cough.  Reports that she had previously been following with surgery and wound care at [facility], but been seen their in at least a year.  She denies any melena, hematochezia, or hematemesis but has had some clear and blood-tinged drainage from her leg wound.  ED Course: Upon arrival to the ED, patient is found to be afebrile, saturating upper upper 80s to low 90s on room air, slightly tachypneic and tachycardic, and with stable blood pressure.  EKG features sinus rhythm.  Chemistry panel with bilirubin 3.2.  CBC notable for leukocytosis to 19,500 and hemoglobin 4.7.  Blood cultures were collected and the patient was treated with Zosyn, IV fluids, multiple doses IV Dilaudid, and started on 1 unit RBC transfusion but continues to complain of severe pain.  Review of Systems:  All other systems reviewed and apart from HPI, are negative.  Past Medical History:  Diagnosis Date   Anemia 03/30/2012   Hx of sickle cell disease   CAP (community acquired pneumonia)    2014   Cholelithiasis 03/30/2012   Chronic wound of extremity 04/01/2012   Elevated LFTs    Leg  ulcer ([place]) 10/27/2012   Chronic under care of wound clinic   Multiple open wounds of lower extremity    chronic wounds B/LLE   Sickle cell disease ([place])    Sinus bradycardia by electrocardiogram 04/03/2012    Past Surgical History:  Procedure Laterality Date   ALLOGRAFT APPLICATION Right 12/28/9145   Procedure: SURGICAL PREP FOR GRAFTING RIGHT LOWER EXTREMITY AND APPLICATION OF Jannifer Hick;  Surgeon: Irene Limbo, MD;  Location: [place];  Service: Plastics;  Laterality: Right;   APPLICATION OF A-CELL OF EXTREMITY Right 10/09/2015   Procedure: APPLICATION OF Jannifer Hick;  Surgeon: Irene Limbo, MD;  Location: [place];  Service: Plastics;  Laterality: Right;   BREAST SURGERY     left breast cyst aspiration   CESAREAN SECTION     CESAREAN SECTION N/A 01/06/2014   Procedure: CESAREAN SECTION;  Surgeon: Emily Filbert, MD;  Location: [facility];  Service: Obstetrics;  Laterality: N/A;   CESAREAN SECTION N/A 05/04/2017   Procedure: CESAREAN SECTION;  Surgeon: Woodroe Mode, MD;  Location: [place];  Service: Obstetrics;  Laterality: N/A;   I & D EXTREMITY Right 10/09/2015   Procedure: SURGICAL PREPARATION FOR GRAFTING RIGHT ANKLE AND APPLICATION THERASKIN;  Surgeon: Irene Limbo, MD;  Location: [facility];  Service: Plastics;  Laterality: Right;   SKIN FULL THICKNESS GRAFT Bilateral 06/17/2016   Procedure: SURGICAL PREP FOR GRAFTING, BILATERAL LOWER EXTREMITIES AND APPLICATION OF Jannifer Hick;  Surgeon: Irene Limbo, MD;  Location: [facility];  Service: Plastics;  Laterality: Bilateral;   TONSILLECTOMY      Social History:   reports that she has never smoked. She has never used smokeless tobacco. She reports that she does not drink alcohol and does not use drugs.  No Known Allergies  Family History  Problem Relation Age of Onset   Sickle cell anemia Sister    Diabetes Mother    Asthma Mother    Sickle cell trait Mother    Sickle  cell trait Father    Hypertension Father      Prior to Admission medications   Medication Sig Start Date End Date Taking? Authorizing Provider  Amino Acids-Protein Hydrolys (FEEDING SUPPLEMENT, PRO-STAT 64,) LIQD Take 30 mLs by mouth 3 (three) times daily with meals. 03/02/19   [name], FNP  Deferasirox (JADENU) 360 MG TABS Take 1 tablet (360 mg total) by mouth daily. 07/21/19   [name], FNP  folic acid (FOLVITE) 1 MG tablet Take 5 tablets (5 mg total) by mouth daily. 01/15/17   Anyanwu, Sallyanne Havers, MD  ibuprofen (ADVIL) 600 MG tablet TAKE 1 TABLET(600 MG) BY MOUTH EVERY 6 HOURS 03/23/21   [name], FNP  Oxycodone HCl 10 MG TABS Take 1 tablet (10 mg total) by mouth every 4 (four) hours as needed. 03/13/21   [name], FNP    Physical Exam: Vitals:   03/28/21 0400 03/28/21 0443 03/28/21 0500 03/28/21 0530  BP: 128/76 130/68 139/71 (!) 133/59  Pulse: 76 66 76 67  Resp: 18 16 12 14   Temp:  97.7 F (36.5 C)    TempSrc:  Oral    SpO2: 100% 100% 100% 100%    Constitutional: NAD, appears uncomfortable   Eyes: PERTLA, lids and conjunctivae normal ENMT: Mucous membranes are moist. Posterior pharynx clear of any exudate or lesions.   Neck: supple, no masses  Respiratory:  no wheezing, no crackles. No accessory muscle use.  Cardiovascular: S1 & S2 heard, regular rate and rhythm. No extremity edema.   Abdomen: No distension, no tenderness, soft. Bowel sounds active.  Musculoskeletal: no clubbing / cyanosis. No joint deformity upper and lower extremities.   Skin: Lower left leg ulcerations involving medial and lateral aspects with serosanguinous drainage and exposure of deep tissues. Right lower leg ulcerations.   Neurologic: CN 2-12 grossly intact. Moving all extremities. Alert and oriented.  Psychiatric: Pleasant. Cooperative.    Labs and Imaging on Admission: I have personally reviewed following labs and imaging studies  CBC: Recent Labs  Lab  03/28/21 0017  WBC 19.5*  NEUTROABS 14.1*  HGB 4.7*  HCT 13.7*  MCV 93.8  PLT 297   Basic Metabolic Panel: Recent Labs  Lab 03/28/21 0017  NA 136  K 3.6  CL 105  CO2 23  GLUCOSE 131*  BUN 11  CREATININE 0.69  CALCIUM 8.2*   GFR: CrCl cannot be calculated (Unknown ideal weight.). Liver Function Tests: Recent Labs  Lab 03/28/21 0017  AST 39  ALT 21  ALKPHOS 58  BILITOT 3.2*  PROT 7.1  ALBUMIN 3.7   No results for input(s): LIPASE, AMYLASE in the last 168 hours. No results for input(s): AMMONIA in the last 168 hours. Coagulation Profile: Recent Labs  Lab 03/28/21 0017  INR 1.2   Cardiac Enzymes: No results for input(s): CKTOTAL, CKMB, CKMBINDEX, TROPONINI in the last 168 hours. BNP (last 3 results) No results for input(s): PROBNP in the last 8760 hours. HbA1C: No results for input(s): HGBA1C in the last 72 hours.  CBG: No results for input(s): GLUCAP in the last 168 hours. Lipid Profile: No results for input(s): CHOL, HDL, LDLCALC, TRIG, CHOLHDL, LDLDIRECT in the last 72 hours. Thyroid Function Tests: No results for input(s): TSH, T4TOTAL, FREET4, T3FREE, THYROIDAB in the last 72 hours. Anemia Panel: Recent Labs    03/28/21 0017  RETICCTPCT 28.2*   Urine analysis:    Component Value Date/Time   COLORURINE YELLOW 04/07/2018 [facility] 04/07/2018 1433   LABSPEC 1.012 04/07/2018 1433   PHURINE 5.0 04/07/2018 1433   GLUCOSEU NEGATIVE 04/07/2018 1433   HGBUR SMALL (A) 04/07/2018 1433   BILIRUBINUR neg 11/08/2020 1015   KETONESUR NEGATIVE 04/07/2018 1433   PROTEINUR Negative 11/08/2020 1015   PROTEINUR 30 (A) 04/07/2018 1433   UROBILINOGEN 0.2 11/08/2020 1015   UROBILINOGEN 0.2 05/14/2017 1144   NITRITE neg 11/08/2020 1015   NITRITE NEGATIVE 04/07/2018 1433   LEUKOCYTESUR Negative 11/08/2020 1015   Sepsis Labs: @LABRCNTIP (procalcitonin:4,lacticidven:4) ) Recent Results (from the past 240 hour(s))  Resp Panel by RT-PCR (Flu A&B,  Covid) Nasopharyngeal Swab     Status: None   Collection Time: 03/28/21 12:22 AM   Specimen: Nasopharyngeal Swab; Nasopharyngeal(NP) swabs in vial transport medium  Result Value Ref Range Status   SARS Coronavirus 2 by RT PCR NEGATIVE NEGATIVE Final    Comment: (NOTE) SARS-CoV-2 target nucleic acids are NOT DETECTED.  The SARS-CoV-2 RNA is generally detectable in upper respiratory specimens during the acute phase of infection. The lowest concentration of SARS-CoV-2 viral copies this assay can detect is 138 copies/mL. A negative result does not preclude SARS-Cov-2 infection and should not be used as the sole basis for treatment or other patient management decisions. A negative result may occur with  improper specimen collection/handling, submission of specimen other than nasopharyngeal swab, presence of viral mutation(s) within the areas targeted by this assay, and inadequate number of viral copies(<138 copies/mL). A negative result must be combined with clinical observations, patient history, and epidemiological information. The expected result is Negative.  Fact Sheet for Patients:  EntrepreneurPulse.com.au  Fact Sheet for Healthcare Providers:  IncredibleEmployment.be  This test is no t yet approved or cleared by the [place] FDA and  has been authorized for detection and/or diagnosis of SARS-CoV-2 by FDA under an Emergency Use Authorization (EUA). This EUA will remain  in effect (meaning this test can be used) for the duration of the COVID-19 declaration under Section 564(b)(1) of the Act, 21 U.S.C.section 360bbb-3(b)(1), unless the authorization is terminated  or revoked sooner.       Influenza A by PCR NEGATIVE NEGATIVE Final   Influenza B by PCR NEGATIVE NEGATIVE Final    Comment: (NOTE) The Xpert Xpress SARS-CoV-2/FLU/RSV plus assay is intended as an aid in the diagnosis of influenza from Nasopharyngeal swab specimens and should  not be used as a sole basis for treatment. Nasal washings and aspirates are unacceptable for Xpert Xpress SARS-CoV-2/FLU/RSV testing.  Fact Sheet for Patients: EntrepreneurPulse.com.au  Fact Sheet for Healthcare Providers: IncredibleEmployment.be  This test is not yet approved or cleared by the [place] FDA and has been authorized for detection and/or diagnosis of SARS-CoV-2 by FDA under an Emergency Use Authorization (EUA). This EUA will remain in effect (meaning this test can be used) for the duration of the COVID-19 declaration under Section 564(b)(1) of the Act, 21 U.S.C. section 360bbb-3(b)(1), unless the authorization is terminated or revoked.  Performed at [facility], [place] [address], [place], [place] [address]  Radiological Exams on Admission: DG Ankle 2 Views Left  Result Date: 03/28/2021 CLINICAL DATA:  Ankle wounds EXAM: LEFT ANKLE - 2 VIEW COMPARISON:  None. FINDINGS: No fracture or dislocation is seen. The joint spaces are preserved. Large soft tissue wound along the posterior aspect of the ankle. No associated cortical destruction to suggest acute osteomyelitis. IMPRESSION: Large soft tissue along the posterior aspect of the ankle. No radiographic findings to suggest acute osteomyelitis. Electronically Signed   By: Julian Hy M.D.   On: 03/28/2021 00:16   DG Ankle 2 Views Right  Result Date: 03/28/2021 CLINICAL DATA:  Ankle wounds EXAM: RIGHT ANKLE - 2 VIEW COMPARISON:  None. FINDINGS: No fracture or dislocation is seen. The joint spaces are preserved. Soft tissue wounds along the posterolateral ankle bilaterally. Mild periosteal reaction along the distal fibula and tibial shaft suggests the possibility of chronic osteomyelitis. No acute osseous destruction. IMPRESSION: Soft tissue wounds along the bilateral ankle. Associated periosteal reaction raises the possibility of chronic osteomyelitis. No acute osseous  destruction. Electronically Signed   By: Julian Hy M.D.   On: 03/28/2021 00:18    EKG: Independently reviewed. Sinus rhythm.   Assessment/Plan   1. Sickle cell anemia with crisis  - Pt with sickle cell anemia and baseline Hgb ~7 presents with severe leg pain and Hgb 4.7 without bleeding  - She was given multiple doses of IV Dilaudid, IVF, and has 1 unit of RBC transfusing in ED but continues to complain of severe pain  - Plan to schedule Toradol, start Dilaudid PCA with close monitoring, and continue IVF hydration   2. Leg ulcers  - Pt with chronic RLE ulcer presents with severe b/l leg pain and newer lower LLE ulcer with deep tissues exposed   - Does not appear acutely infected, she is not diabetic, etiology unclear possibly chronic venous stasis given location  - Check [name] and plain radiographs, consider MRI   3. Leukocytosis  - WBC is 19,500 in ED without fever  - Appears to be chronic  - Blood cultures were collected in ED and she was given Zosyn  - Leg ulcer does not appear acutely infected, plan to follow cultures and clinical course off of antibiotics    DVT prophylaxis: Lovenox  Code Status: Full  Level of Care: Level of care: Med-Surg Family Communication: none present Disposition Plan:  Patient is from: home  Anticipated d/c is to: Home  Anticipated d/c date is: 03/31/21 Patient currently: Pending pain-control, [name], radiographs lower left leg Consults called: none  Admission status: Inpatient     Vianne Bulls, MD [facility]  03/28/2021, 6:17 AM

## 2021-03-28 NOTE — Consult Note (Signed)
[facility] Nurse Consult Note: Patient receiving care in [place] with Sickle Cell crisis and complaints of extreme BL lower extremity pain with worsening ulcers of the lower legs Reason for Consult: BLE ulcers Wound type: (See photos in media) Right lateral full thickness wound measures 14.3 x 11 and is 25% pink and 75% yellow Right medial full thickness wound measures 12 x 7.5 and is 25% pink 75% yellow Left medial full thickness wound measures 7 x 3.2 x 1.8 and is 20% yellow 80% ruddy red Left lateral full thickness wound posterior measures 6 x 3.5 x 0.7 and is 50% pink/red and 50% yellow Left lateral full thickness wound anterior measures 2.7 x 1.5 x 0.5 and is 100% yellow No odor noted with any of the wounds. No drainage that I was able see however the patient states that they drain more at times than others.  Pressure Injury POA: NA Periwound: Intact Dressing procedure/placement/frequency: Wrap both LE with Xeroform gauze followed by Kerlix wrap and Coban wrap. Patient can not tolerate a tight wrap. Change daily. If the wounds start to drain too much the dressing may be changed twice daily.  Monitor the wound area(s) for worsening of condition such as: Signs/symptoms of infection, increase in size, development of or worsening of odor, development of pain, or increased pain at the affected locations.   Notify the medical team if any of these develop.  Thank you for the consult. [facility] nurse will not follow at this time.   Please re-consult the [facility] team if needed.  Cathlean Marseilles Tamala Julian, MSN, RN, [place], [facility]le Pearl, [facility] Wound Treatment Associate Pager [phone]

## 2021-03-29 ENCOUNTER — Inpatient Hospital Stay (HOSPITAL_COMMUNITY): Payer: Medicaid Other

## 2021-03-29 DIAGNOSIS — D5700 Hb-SS disease with crisis, unspecified: Principal | ICD-10-CM

## 2021-03-29 LAB — RETICULOCYTES
Immature Retic Fract: 38.1 % — ABNORMAL HIGH (ref 2.3–15.9)
RBC.: 2.24 MIL/uL — ABNORMAL LOW (ref 3.87–5.11)
Retic Count, Absolute: 513.2 10*3/uL — ABNORMAL HIGH (ref 19.0–186.0)
Retic Ct Pct: 22.9 % — ABNORMAL HIGH (ref 0.4–3.1)

## 2021-03-29 LAB — BASIC METABOLIC PANEL
Anion gap: 5 (ref 5–15)
BUN: 5 mg/dL — ABNORMAL LOW (ref 6–20)
CO2: 24 mmol/L (ref 22–32)
Calcium: 8.3 mg/dL — ABNORMAL LOW (ref 8.9–10.3)
Chloride: 109 mmol/L (ref 98–111)
Creatinine, Ser: 0.34 mg/dL — ABNORMAL LOW (ref 0.44–1.00)
GFR, Estimated: >60 mL/min (ref >60)
Glucose, Bld: 85 mg/dL (ref 70–99)
Potassium: 4.2 mmol/L (ref 3.5–5.1)
Sodium: 138 mmol/L (ref 135–145)

## 2021-03-29 LAB — BPAM RBC
Blood Product Expiration Date: 202211282359
Blood Product Expiration Date: 202211282359
ISSUE DATE / TIME: 202211090225
ISSUE DATE / TIME: 202211090800
Unit Type and Rh: 1700
Unit Type and Rh: 1700

## 2021-03-29 LAB — TYPE AND SCREEN
ABO/RH(D): B POS
Antibody Screen: NEGATIVE
Unit division: 0
Unit division: 0

## 2021-03-29 LAB — HIV ANTIBODY (ROUTINE TESTING W REFLEX): HIV Screen 4th Generation wRfx: NONREACTIVE

## 2021-03-29 LAB — CBC WITH DIFFERENTIAL/PLATELET
Abs Immature Granulocytes: 0.13 10*3/uL — ABNORMAL HIGH (ref 0.00–0.07)
Basophils Absolute: 0.1 10*3/uL (ref 0.0–0.1)
Basophils Relative: 1 %
Eosinophils Absolute: 1 10*3/uL — ABNORMAL HIGH (ref 0.0–0.5)
Eosinophils Relative: 6 %
HCT: 20.2 % — ABNORMAL LOW (ref 36.0–46.0)
Hemoglobin: 7 g/dL — ABNORMAL LOW (ref 12.0–15.0)
Immature Granulocytes: 1 %
Lymphocytes Relative: 14 %
Lymphs Abs: 2.3 10*3/uL (ref 0.7–4.0)
MCH: 31 pg (ref 26.0–34.0)
MCHC: 34.7 g/dL (ref 30.0–36.0)
MCV: 89.4 fL (ref 80.0–100.0)
Monocytes Absolute: 2 10*3/uL — ABNORMAL HIGH (ref 0.1–1.0)
Monocytes Relative: 12 %
Neutro Abs: 10.4 10*3/uL — ABNORMAL HIGH (ref 1.7–7.7)
Neutrophils Relative %: 66 %
Platelets: 316 10*3/uL (ref 150–400)
RBC: 2.26 MIL/uL — ABNORMAL LOW (ref 3.87–5.11)
RDW: 20.3 % — ABNORMAL HIGH (ref 11.5–15.5)
WBC: 15.8 10*3/uL — ABNORMAL HIGH (ref 4.0–10.5)
nRBC: 9.2 % — ABNORMAL HIGH (ref 0.0–0.2)

## 2021-03-29 LAB — LACTATE DEHYDROGENASE: LDH: 327 U/L — ABNORMAL HIGH (ref 98–192)

## 2021-03-29 MED ORDER — CEFEPIME HCL 2 G IJ SOLR
2.0000 g | Freq: Three times a day (TID) | INTRAMUSCULAR | Status: DC
  Administered 2021-03-29 – 2021-04-03 (×15): 2 g via INTRAVENOUS
  Filled 2021-03-29 (×15): qty 2

## 2021-03-29 MED ORDER — METRONIDAZOLE 500 MG/100ML IV SOLN
500.0000 mg | Freq: Two times a day (BID) | INTRAVENOUS | Status: DC
  Administered 2021-03-29 – 2021-04-03 (×10): 500 mg via INTRAVENOUS
  Filled 2021-03-29 (×10): qty 100

## 2021-03-29 MED ORDER — VANCOMYCIN HCL 750 MG/150ML IV SOLN
750.0000 mg | Freq: Two times a day (BID) | INTRAVENOUS | Status: DC
  Administered 2021-03-30 – 2021-03-31 (×3): 750 mg via INTRAVENOUS
  Filled 2021-03-29 (×4): qty 150

## 2021-03-29 MED ORDER — VANCOMYCIN HCL 1000 MG/200ML IV SOLN
1000.0000 mg | Freq: Once | INTRAVENOUS | Status: AC
  Administered 2021-03-29: 1000 mg via INTRAVENOUS
  Filled 2021-03-29: qty 200

## 2021-03-29 NOTE — Progress Notes (Signed)
[name] is a 50 year old female with a medical history significant for sickle cell disease type SS, chronic pain syndrome, opiate dependence and tolerance, and nonhealing bilateral lower extremity ulcers was admitted for lower extremity wound infection in the setting of sickle cell pain crisis. Patient says that she was in her usual state of health and managing chronic pain with oxycodone prior to last night. Patient has had right leg ulcer over the past several years and was followed by wound care at [facility].  She states that she recently developed a left leg wound that has been worsening.  She says that she has been lost to follow-up with wound care, the right wound was not healing and she felt that it was not helping at all.  She also states that she has been feeling very hopeless in terms of her wound healing.  She also says that it has been very difficult to leave home due to worsening smell of lower extremity wounds.  She says that she noticed that pain has been worsening, however she has 3 small children and was unable to follow-up with her sickle cell provider to discuss pain control.  Currently, her pain intensity is 8/10.  She states it is better controlled since Dilaudid PCA was initiated.   Care plan: Transfused second unit of PRBCs, awaiting H&H results Zosyn per pharmacy consult Oxycodone 10 mg every 4 hours as needed for breakthrough pain Reduce IV fluids to KVO CBC with differential, LDH, reticulocytes, and BMP in a.m. Collect bilateral wound cultures Wound care consulted, will not follow at this time.   [name]  APRN, MSN, FNP-C [facility] [address] [place], [place] [address] [phone]

## 2021-03-29 NOTE — Progress Notes (Addendum)
Subjective: [name] is a 50 year old female with a medical history significant for sickle cell disease type SS, chronic pain syndrome, opiate dependence and tolerance, and chronic bilateral leg ulcers was admitted for lower extremities wound infection and sickle cell pain crisis. Today, patient states that pain intensity has improved on IV Dilaudid PCA.  She rates her pain as 5/10 as long as she is sitting.  She states that lower extremity pain intensity escalates with weightbearing.  Patient's hemoglobin has improved today, 7.0 g/dL, which is consistent with her baseline of 7-8 g/dL.  Patient denies any shortness of breath, urinary symptoms, nausea, vomiting, or diarrhea.  Objective:  Vital signs in last 24 hours:  Vitals:   03/29/21 0348 03/29/21 0400 03/29/21 0747 03/29/21 1031  BP: 108/76   102/67  Pulse: 65   64  Resp:   16 14  Temp: 97.9 F (36.6 C)   97.6 F (36.4 C)  TempSrc: Oral   Oral  SpO2: 99%  98% 100%  Weight:  55.9 kg      Intake/Output from previous day:   Intake/Output Summary (Last 24 hours) at 03/29/2021 1141 Last data filed at 03/29/2021 0342 Gross per 24 hour  Intake 1449.95 ml  Output 500 ml  Net 949.95 ml    Physical Exam: General: Alert, awake, oriented x3, in no acute distress.  HEENT: [place]/AT PEERL, EOMI Neck: Trachea midline,  no masses, no thyromegal,y no JVD, no carotid bruit OROPHARYNX:  Moist, No exudate/ erythema/lesions.  Heart: Regular rate and rhythm, without murmurs, rubs, gallops, PMI non-displaced, no heaves or thrills on palpation.  Lungs: Clear to auscultation, no wheezing or rhonchi noted. No increased vocal fremitus resonant to percussion  Abdomen: Soft, nontender, nondistended, positive bowel sounds, no masses no hepatosplenomegaly noted..  Neuro: No focal neurological deficits noted cranial nerves II through XII grossly intact. DTRs 2+ bilaterally upper and lower extremities. Strength 5 out of 5 in bilateral upper and lower  extremities. Musculoskeletal: No warm swelling or erythema around joints, no spinal tenderness noted. Psychiatric: Patient alert and oriented x3, good insight and cognition, good recent to remote recall. Lab Results:  Basic Metabolic Panel:    Component Value Date/Time   NA 138 03/29/2021 0616   NA 139 11/08/2020 0948   NA 139 01/20/2014 0758   K 4.2 03/29/2021 0616   K 3.9 01/20/2014 0758   CL 109 03/29/2021 0616   CO2 24 03/29/2021 0616   CO2 23 01/20/2014 0758   BUN 5 (L) 03/29/2021 0616   BUN 5 (L) 11/08/2020 0948   BUN 6.0 (L) 01/20/2014 0758   CREATININE 0.34 (L) 03/29/2021 0616   CREATININE 0.30 (L) 12/20/2016 0925   CREATININE 0.5 (L) 01/20/2014 0758   GLUCOSE 85 03/29/2021 0616   GLUCOSE 72 01/20/2014 0758   CALCIUM 8.3 (L) 03/29/2021 0616   CALCIUM 9.0 01/20/2014 0758   CBC:    Component Value Date/Time   WBC 15.8 (H) 03/29/2021 0616   HGB 7.0 (L) 03/29/2021 0616   HGB 5.7 (LL) 11/08/2020 0948   HGB 8.7 (L) 01/20/2014 0757   HCT 20.2 (L) 03/29/2021 0616   HCT 16.0 (LL) 11/08/2020 0948   HCT 25.2 (L) 01/20/2014 0757   PLT 316 03/29/2021 0616   PLT 464 (H) 11/08/2020 0948   MCV 89.4 03/29/2021 0616   MCV 88 11/08/2020 0948   MCV 84.3 01/20/2014 0757   NEUTROABS 10.4 (H) 03/29/2021 0616   NEUTROABS 9.3 (H) 11/08/2020 0948   NEUTROABS 8.7 (H) 01/20/2014 0757  LYMPHSABS 2.3 03/29/2021 0616   LYMPHSABS 2.3 11/08/2020 0948   LYMPHSABS 2.7 01/20/2014 0757   MONOABS 2.0 (H) 03/29/2021 0616   MONOABS 1.9 (H) 01/20/2014 0757   EOSABS 1.0 (H) 03/29/2021 0616   EOSABS 0.5 (H) 11/08/2020 0948   BASOSABS 0.1 03/29/2021 0616   BASOSABS 0.1 11/08/2020 0948   BASOSABS 0.2 (H) 01/20/2014 0757    Recent Results (from the past 240 hour(s))  Blood culture (routine x 2)     Status: None (Preliminary result)   Collection Time: 03/28/21 12:14 AM   Specimen: BLOOD RIGHT FOREARM  Result Value Ref Range Status   Specimen Description BLOOD RIGHT FOREARM  Final   Special  Requests   Final    BOTTLES DRAWN AEROBIC AND ANAEROBIC Blood Culture adequate volume   Culture   Final    NO GROWTH 1 DAY Performed at [facility], [place] [address], [place], [place] [address]    Report Status PENDING  Incomplete  Blood culture (routine x 2)     Status: None (Preliminary result)   Collection Time: 03/28/21 12:14 AM   Specimen: BLOOD LEFT FOREARM  Result Value Ref Range Status   Specimen Description BLOOD LEFT FOREARM  Final   Special Requests   Final    BOTTLES DRAWN AEROBIC AND ANAEROBIC Blood Culture results may not be optimal due to an excessive volume of blood received in culture bottles   Culture   Final    NO GROWTH 1 DAY Performed at [facility], [place] [address], [place], [place] [address]    Report Status PENDING  Incomplete  Resp Panel by RT-PCR (Flu A&B, Covid) Nasopharyngeal Swab     Status: None   Collection Time: 03/28/21 12:22 AM   Specimen: Nasopharyngeal Swab; Nasopharyngeal(NP) swabs in vial transport medium  Result Value Ref Range Status   SARS Coronavirus 2 by RT PCR NEGATIVE NEGATIVE Final    Comment: (NOTE) SARS-CoV-2 target nucleic acids are NOT DETECTED.  The SARS-CoV-2 RNA is generally detectable in upper respiratory specimens during the acute phase of infection. The lowest concentration of SARS-CoV-2 viral copies this assay can detect is 138 copies/mL. A negative result does not preclude SARS-Cov-2 infection and should not be used as the sole basis for treatment or other patient management decisions. A negative result may occur with  improper specimen collection/handling, submission of specimen other than nasopharyngeal swab, presence of viral mutation(s) within the areas targeted by this assay, and inadequate number of viral copies(<138 copies/mL). A negative result must be combined with clinical observations, patient history, and epidemiological information. The expected result is Negative.  Fact Sheet for Patients:   EntrepreneurPulse.com.au  Fact Sheet for Healthcare Providers:  IncredibleEmployment.be  This test is no t yet approved or cleared by the [place] FDA and  has been authorized for detection and/or diagnosis of SARS-CoV-2 by FDA under an Emergency Use Authorization (EUA). This EUA will remain  in effect (meaning this test can be used) for the duration of the COVID-19 declaration under Section 564(b)(1) of the Act, 21 U.S.C.section 360bbb-3(b)(1), unless the authorization is terminated  or revoked sooner.       Influenza A by PCR NEGATIVE NEGATIVE Final   Influenza B by PCR NEGATIVE NEGATIVE Final    Comment: (NOTE) The Xpert Xpress SARS-CoV-2/FLU/RSV plus assay is intended as an aid in the diagnosis of influenza from Nasopharyngeal swab specimens and should not be used as a sole basis for treatment. Nasal washings and aspirates  are unacceptable for Xpert Xpress SARS-CoV-2/FLU/RSV testing.  Fact Sheet for Patients: EntrepreneurPulse.com.au  Fact Sheet for Healthcare Providers: IncredibleEmployment.be  This test is not yet approved or cleared by the [place] FDA and has been authorized for detection and/or diagnosis of SARS-CoV-2 by FDA under an Emergency Use Authorization (EUA). This EUA will remain in effect (meaning this test can be used) for the duration of the COVID-19 declaration under Section 564(b)(1) of the Act, 21 U.S.C. section 360bbb-3(b)(1), unless the authorization is terminated or revoked.  Performed at [facility], [place] [address] [place], [place] [address]   Aerobic Culture w Gram Stain (superficial specimen)     Status: None (Preliminary result)   Collection Time: 03/28/21  7:02 PM   Specimen: Foot  Result Value Ref Range Status   Specimen Description   Final    FOOT RIGHT Performed at [place] [address] [place], [place] [address]     Special Requests   Final    Immunocompromised Performed at [facility], [place] [address] [place], [place] [address]    Gram Stain   Final    RARE WBC PRESENT, PREDOMINANTLY PMN RARE GRAM POSITIVE COCCI IN PAIRS    Culture   Final    TOO YOUNG TO READ Performed at [facility], [place] [address], [place], [place] [address]    Report Status PENDING  Incomplete  Aerobic Culture w Gram Stain (superficial specimen)     Status: None (Preliminary result)   Collection Time: 03/28/21  7:02 PM   Specimen: Foot  Result Value Ref Range Status   Specimen Description   Final    FOOT LEFT Performed at [place] [address] [place], [place] [address]    Special Requests   Final    NONE Performed at [facility], [place] [address], [place], [place] [address]    Gram Stain   Final    MODERATE WBC PRESENT,BOTH PMN AND MONONUCLEAR FEW GRAM NEGATIVE RODS FEW GRAM POSITIVE COCCI IN PAIRS FEW GRAM NEGATIVE COCCI IN PAIRS    Culture   Final    TOO YOUNG TO READ Performed at [facility], [place] [address], [place], [place] [address]    Report Status PENDING  Incomplete    Studies/Results: DG Tibia/Fibula Left  Result Date: 03/28/2021 CLINICAL DATA:  Open wound with ulceration to the bilateral lower leg EXAM: LEFT TIBIA AND FIBULA - 2 VIEW COMPARISON:  05/01/2016 FINDINGS: Generalized muscular atrophy and osteopenia. Chondroid lesion or bone infarct in the distal tibia. Posterior lower leg ulcer without opaque foreign body or soft tissue emphysema. No evidence of osseous infection. IMPRESSION: No soft tissue emphysema or acute osseous finding at the lower leg wound. Electronically Signed   By: Jorje Guild M.D.   On: 03/28/2021 07:01   DG Ankle 2 Views Left  Result Date: 03/28/2021 CLINICAL DATA:  Ankle wounds EXAM: LEFT ANKLE - 2 VIEW COMPARISON:  None. FINDINGS: No fracture or dislocation is seen. The joint spaces are  preserved. Large soft tissue wound along the posterior aspect of the ankle. No associated cortical destruction to suggest acute osteomyelitis. IMPRESSION: Large soft tissue along the posterior aspect of the ankle. No radiographic findings to suggest acute osteomyelitis. Electronically Signed   By: Julian Hy M.D.   On: 03/28/2021 00:16   DG Ankle 2 Views Right  Result Date: 03/28/2021 CLINICAL DATA:  Ankle wounds EXAM: RIGHT ANKLE -  2 VIEW COMPARISON:  None. FINDINGS: No fracture or dislocation is seen. The joint spaces are preserved. Soft tissue wounds along the posterolateral ankle bilaterally. Mild periosteal reaction along the distal fibula and tibial shaft suggests the possibility of chronic osteomyelitis. No acute osseous destruction. IMPRESSION: Soft tissue wounds along the bilateral ankle. Associated periosteal reaction raises the possibility of chronic osteomyelitis. No acute osseous destruction. Electronically Signed   By: Julian Hy M.D.   On: 03/28/2021 00:18   VAS [place] [name] WITH/WO TBI  Result Date: 03/28/2021  LOWER EXTREMITY DOPPLER STUDY Patient Name:  EVANGELINA DELANCEY  Date of Exam:   03/28/2021 Medical Rec #: [id number]       Accession #:    [id number] Date of Birth: 1970/05/27       Patient Gender: F Patient Age:   61 years Exam Location:  [facility] Procedure:      VAS [place] [name] WITH/WO TBI Referring Phys: TIMOTHY OPYD --------------------------------------------------------------------------------  Indications: Bilateral ulcerations on feet.  Limitations: Today's exam was limited due to bandages and unable to perform              pressures due to patient pain tolerance. Comparison Study: no prior Performing Technologist: Archie Patten RVS  Examination Guidelines: A complete evaluation includes at minimum, Doppler waveform signals and systolic blood pressure reading at the level of bilateral brachial, anterior tibial, and posterior tibial arteries, when vessel segments are  accessible. Bilateral testing is considered an integral part of a complete examination. Photoelectric Plethysmograph (PPG) waveforms and toe systolic pressure readings are included as required and additional duplex testing as needed. Limited examinations for reoccurring indications may be performed as noted.  [name] Findings: +--------+------------------+-----+---------+--------+ Right   Rt Pressure (mmHg)IndexWaveform Comment  +--------+------------------+-----+---------+--------+ Brachial                       triphasic         +--------+------------------+-----+---------+--------+ PTA                            biphasic          +--------+------------------+-----+---------+--------+ DP                             biphasic          +--------+------------------+-----+---------+--------+ +--------+------------------+-----+---------+-------+ Left    Lt Pressure (mmHg)IndexWaveform Comment +--------+------------------+-----+---------+-------+ Brachial                       triphasic        +--------+------------------+-----+---------+-------+ PTA                            triphasic        +--------+------------------+-----+---------+-------+ DP                             biphasic         +--------+------------------+-----+---------+-------+  Summary: Right: Due to bilateral lower extremity ankle wounds and pain tolerance- abi was not obtained. Left: Due to bilateral lower extremity ankle wounds and pain tolerance- abi was not obtained.  *See table(s) above for measurements and observations.  Electronically signed by Jamelle Haring on 03/28/2021 at 12:06:31 PM.    Final     Medications: Scheduled Meds:  enoxaparin (LOVENOX) injection  40 mg Subcutaneous  Daily   HYDROmorphone   Intravenous Q4H   ketorolac  15 mg Intravenous Q6H   senna-docusate  1 tablet Oral BID   Continuous Infusions:  sodium chloride 10 mL/hr at 03/28/21 1333   diphenhydrAMINE      piperacillin-tazobactam (ZOSYN)  IV 3.375 g (03/29/21 1003)   PRN Meds:.acetaminophen, diphenhydrAMINE **OR** diphenhydrAMINE, HYDROmorphone (DILAUDID) injection, naloxone **AND** sodium chloride flush, ondansetron (ZOFRAN) IV, oxyCODONE, polyethylene glycol  Consultants: Wound care consult Transition of care consult  Procedures: MRI lower extremities  Antibiotics: IV cefepime IV metronidazole IV vancomycin  Assessment/Plan: Principal Problem:   Sickle cell pain crisis ([place]) Active Problems:   Symptomatic anemia   Bilateral leg ulcer (HCC)  Sickle cell disease with pain crisis: Will continue IV Dilaudid PCA without any changes in settings today Oxycodone 10 mg every 4 hours as needed for severe breakthrough pain Toradol 15 mg IV every 6 hours for total of 5 days Monitor vital signs very closely, reevaluate pain scale regularly, and supplemental oxygen as needed  Bilateral leg ulcers: Patient previously had chronic RLE leg ulcers, over the past several months she has developed leg ulcers to LLE as well.  On x-ray, and showed: LLE, no soft tissue emphysema or acute osseous finding at the lower leg wound.  Right ankle shows soft wounds along the bilateral ankle.  Associated periosteal reaction raises possibility of chronic osteomyelitis.  No acute osseous destruction.  Left view shows large soft tissue along the posterior aspect of the ankle.  No suggestion of acute osteomyelitis. Continue IV antibiotics.  Review MRI as results become available Follow wound cultures, will de-escalate antibiotics as wound culture results become available.  Gram stain of left wound culture shows moderate WBCs, few gram-negative rods, gram-positive cocci in pairs, and gram-negative cocci in pairs.  Right gram stain shows gram-positive cocci in pairs.  Anemia of chronic disease: On admission, patient's hemoglobin was 4.7 g/dL.  She is status post 2 units PRBCs.  Today, hemoglobin is 7.0 g/dL.  We will  continue to follow closely.  We will transfuse additional units if hemoglobin is less than 7. Follow CBC in AM.  Leukocytosis: WBCs improving.  Patient afebrile overnight.  Blood cultures negative. Code Status: Full Code Family Communication: N/A Disposition Plan: Not yet ready for discharge   [place], MSN, FNP-C [facility] [address] [place], [place] [address] [phone]  If 5PM-8AM, please contact night-coverage.  03/29/2021, 11:41 AM  LOS: 1 day

## 2021-03-29 NOTE — Progress Notes (Signed)
Pharmacy Antibiotic Note  [name] is a 50 y.o. female admitted on 03/27/2021 with  wound infection .  Pharmacy has been consulted for vancomycin dosing.  Plan: Vancomycin 1g IV x 1, then 750mg  IV q12h for estimated AUC 510 using SCr rounded up to 0.8, Vd 0.72 L/kg Check vancomycin levels as needed, goal AUC 400-550 Cefepime/Flagyl per MD  Weight: 55.9 kg (123 lb 3.8 oz)  Temp (24hrs), Avg:98.3 F (36.8 C), Min:97.6 F (36.4 C), Max:99.4 F (37.4 C)  Recent Labs  Lab 03/27/21 2347 03/28/21 0017 03/28/21 0247 03/28/21 1508 03/29/21 0616  WBC  --  19.5*  --  26.2* 15.8*  CREATININE  --  0.69  --   --  0.34*  LATICACIDVEN 2.1*  --  1.9  --   --     CrCl cannot be calculated (Unknown ideal weight.).    No Known Allergies  Antimicrobials this admission: 11/9 Zosyn >> 11/10 11/10 Vancomycin >> 11/10 Cefepime >> 11/10 Flagyl >>  Dose adjustments this admission:  Microbiology results: 11/9 BCx: 11/9 R foot: 11/9 L foot:  Thank you for allowing pharmacy to be a part of this patient's care.  Peggyann Juba, PharmD, BCPS Pharmacy: [phone] 03/29/2021 3:18 PM

## 2021-03-29 NOTE — Progress Notes (Signed)
Patient refused Wound dressing this morning.

## 2021-03-30 DIAGNOSIS — D5700 Hb-SS disease with crisis, unspecified: Secondary | ICD-10-CM | POA: Diagnosis not present

## 2021-03-30 LAB — CBC
HCT: 19.5 % — ABNORMAL LOW (ref 36.0–46.0)
Hemoglobin: 6.6 g/dL — CL (ref 12.0–15.0)
MCH: 30.7 pg (ref 26.0–34.0)
MCHC: 33.8 g/dL (ref 30.0–36.0)
MCV: 90.7 fL (ref 80.0–100.0)
Platelets: 293 10*3/uL (ref 150–400)
RBC: 2.15 MIL/uL — ABNORMAL LOW (ref 3.87–5.11)
RDW: 18.6 % — ABNORMAL HIGH (ref 11.5–15.5)
WBC: 12 10*3/uL — ABNORMAL HIGH (ref 4.0–10.5)
nRBC: 7.1 % — ABNORMAL HIGH (ref 0.0–0.2)

## 2021-03-30 LAB — DIFFERENTIAL
Abs Immature Granulocytes: 0.06 10*3/uL (ref 0.00–0.07)
Basophils Absolute: 0.1 10*3/uL (ref 0.0–0.1)
Basophils Relative: 1 %
Eosinophils Absolute: 1 10*3/uL — ABNORMAL HIGH (ref 0.0–0.5)
Eosinophils Relative: 8 %
Immature Granulocytes: 1 %
Lymphocytes Relative: 15 %
Lymphs Abs: 1.9 10*3/uL (ref 0.7–4.0)
Monocytes Absolute: 1.9 10*3/uL — ABNORMAL HIGH (ref 0.1–1.0)
Monocytes Relative: 15 %
Neutro Abs: 7.5 10*3/uL (ref 1.7–7.7)
Neutrophils Relative %: 60 %

## 2021-03-30 LAB — HEMOGLOBIN AND HEMATOCRIT, BLOOD
HCT: 23.9 % — ABNORMAL LOW (ref 36.0–46.0)
Hemoglobin: 8.2 g/dL — ABNORMAL LOW (ref 12.0–15.0)

## 2021-03-30 LAB — TECHNOLOGIST SMEAR REVIEW

## 2021-03-30 LAB — PREPARE RBC (CROSSMATCH)

## 2021-03-30 MED ORDER — SODIUM CHLORIDE 0.9% IV SOLUTION: Freq: Once | INTRAVENOUS | Status: AC

## 2021-03-30 NOTE — TOC Initial Note (Signed)
Transition of Care [facility]) - Initial/Assessment Note    Patient Details  Name: [name] MRN: [id number] Date of Birth: [birth year]  Transition of [facility]) CM/SW Contact:    Matteo Banke, Marjie Skiff, RN Phone Number: 03/30/2021, 10:48 AM  Clinical Narrative:                 Nursing staff asked to do teaching with pt/family on dressing change and wound care. Unfortunately due to pt insurance she will not be able to get a HHRN.    Activities of Daily Living Home Assistive Devices/Equipment: None ADL Screening (condition at time of admission) Patient's cognitive ability adequate to safely complete daily activities?: Yes Is the patient deaf or have difficulty hearing?: No Does the patient have difficulty seeing, even when wearing glasses/contacts?: No Does the patient have difficulty concentrating, remembering, or making decisions?: No Patient able to express need for assistance with ADLs?: No Does the patient have difficulty dressing or bathing?: No Independently performs ADLs?: Yes (appropriate for developmental age) Does the patient have difficulty walking or climbing stairs?: No Weakness of Legs: Both Weakness of Arms/Hands: Both       Admission diagnosis:  Infected wound [T14.8XXA, L08.9] Low hemoglobin [D64.9] Bilateral leg ulcer ([place]) [L97.919, L97.929] Sickle cell pain crisis ([place]) [H41.74] Complicated wound infection [T14.8XXA, L08.9] Patient Active Problem List   Diagnosis Date Noted   Bilateral leg ulcer ([place]) 03/28/2021   Symptomatic anemia 05/01/2020   At risk for seizures due to oxygen toxicity 02/09/2020   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   Venous stasis 01/19/2020   Partial loss of skin graft 01/12/2020   Acute respiratory failure with hypoxia ([place]) 12/15/2019   Bone infarction of distal tibia, 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   Sickle cell anemia with crisis ([place]) 09/03/2018   Other chronic pain    Sickle cell crisis ([place]) 06/26/2018   Sickle cell pain crisis ([place])  04/07/2018   Avascular necrosis of bone of hip, left ([place]) 12/08/2017   Avascular necrosis of bone of hip, right ([place]) 12/08/2017   Hb-SS disease without crisis ([place]) 11/26/2017   Anemia of chronic disease 11/26/2017   Abnormal quad screen    Ringworm of body 04/05/2017   Ulcer of right lower extremity ([place])    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[place]) 03/02/2017   History of pre-eclampsia 12/16/2016   Previous cesarean delivery x 2 07/22/2013   Hemochromatosis 11/10/2012   Chronic pain syndrome 10/13/2012   Leukocytosis 03/30/2012   PCP:  [name], FNP Pharmacy:   [facility] [place], [place] - [place] [address] [place] [address] Phone: [phone] Fax: [phone]  [id number] [place], [place] - [address] AT [address]OSS [address] [place] [address] Phone: [phone] Fax: [phone]     Social Determinants of Health (SDOH) Interventions    Readmission Risk Interventions No flowsheet data found.

## 2021-03-30 NOTE — Progress Notes (Signed)
Pt refusing her Lovenox shot. MD notified

## 2021-03-30 NOTE — Progress Notes (Signed)
Subjective: [name] is a 50 year old female with a medical history significant for sickle cell disease type SS, chronic pain syndrome, opiate dependence and tolerance, and chronic bilateral leg ulcers was admitted for lower extremities wound infection and sickle cell pain crisis. Today, patient states that pain intensity has improved on IV Dilaudid PCA.  She rates her pain as 5/10 as long as she is sitting.  She states that lower extremity pain intensity continues to escalate with weightbearing.  Today, patient's hemoglobin is 6.6 g/dL today, which is decreased from previous. Patient is s/p 2 units PRBCs.  Patient denies any shortness of breath, urinary symptoms, nausea, vomiting, or diarrhea.  Objective:  Vital signs in last 24 hours:  Vitals:   03/30/21 1217 03/30/21 1403 03/30/21 1445 03/30/21 1505  BP:  (!) 101/53 (!) 101/57 104/63  Pulse:  (!) 57 60 63  Resp: 15 15 16 16   Temp:  98.2 F (36.8 C) 98.1 F (36.7 C) 98.8 F (37.1 C)  TempSrc:  Oral Oral Oral  SpO2: 96% 98% 99% 99%  Weight:        Intake/Output from previous day:   Intake/Output Summary (Last 24 hours) at 03/30/2021 1711 Last data filed at 03/30/2021 1658 Gross per 24 hour  Intake 1294 ml  Output 700 ml  Net 594 ml    Physical Exam: General: Alert, awake, oriented x3, in no acute distress.  HEENT: [place]/AT PEERL, EOMI Neck: Trachea midline,  no masses, no thyromegal,y no JVD, no carotid bruit OROPHARYNX:  Moist, No exudate/ erythema/lesions.  Heart: Regular rate and rhythm, without murmurs, rubs, gallops, PMI non-displaced, no heaves or thrills on palpation.  Lungs: Clear to auscultation, no wheezing or rhonchi noted. No increased vocal fremitus resonant to percussion  Abdomen: Soft, nontender, nondistended, positive bowel sounds, no masses no hepatosplenomegaly noted..  Neuro: No focal neurological deficits noted cranial nerves II through XII grossly intact. DTRs 2+ bilaterally upper and lower  extremities. Strength 5 out of 5 in bilateral upper and lower extremities. Musculoskeletal: No warm swelling or erythema around joints, no spinal tenderness noted. Psychiatric: Patient alert and oriented x3, good insight and cognition, good recent to remote recall. Lab Results:  Basic Metabolic Panel:    Component Value Date/Time   NA 138 03/29/2021 0616   NA 139 11/08/2020 0948   NA 139 01/20/2014 0758   K 4.2 03/29/2021 0616   K 3.9 01/20/2014 0758   CL 109 03/29/2021 0616   CO2 24 03/29/2021 0616   CO2 23 01/20/2014 0758   BUN 5 (L) 03/29/2021 0616   BUN 5 (L) 11/08/2020 0948   BUN 6.0 (L) 01/20/2014 0758   CREATININE 0.34 (L) 03/29/2021 0616   CREATININE 0.30 (L) 12/20/2016 0925   CREATININE 0.5 (L) 01/20/2014 0758   GLUCOSE 85 03/29/2021 0616   GLUCOSE 72 01/20/2014 0758   CALCIUM 8.3 (L) 03/29/2021 0616   CALCIUM 9.0 01/20/2014 0758   CBC:    Component Value Date/Time   WBC 12.0 (H) 03/30/2021 0511   HGB 6.6 (LL) 03/30/2021 0511   HGB 5.7 (LL) 11/08/2020 0948   HGB 8.7 (L) 01/20/2014 0757   HCT 19.5 (L) 03/30/2021 0511   HCT 16.0 (LL) 11/08/2020 0948   HCT 25.2 (L) 01/20/2014 0757   PLT 293 03/30/2021 0511   PLT 464 (H) 11/08/2020 0948   MCV 90.7 03/30/2021 0511   MCV 88 11/08/2020 0948   MCV 84.3 01/20/2014 0757   NEUTROABS 7.5 03/30/2021 0511   NEUTROABS 9.3 (H) 11/08/2020  0948   NEUTROABS 8.7 (H) 01/20/2014 0757   LYMPHSABS 1.9 03/30/2021 0511   LYMPHSABS 2.3 11/08/2020 0948   LYMPHSABS 2.7 01/20/2014 0757   MONOABS 1.9 (H) 03/30/2021 0511   MONOABS 1.9 (H) 01/20/2014 0757   EOSABS 1.0 (H) 03/30/2021 0511   EOSABS 0.5 (H) 11/08/2020 0948   BASOSABS 0.1 03/30/2021 0511   BASOSABS 0.1 11/08/2020 0948   BASOSABS 0.2 (H) 01/20/2014 0757    Recent Results (from the past 240 hour(s))  Blood culture (routine x 2)     Status: None (Preliminary result)   Collection Time: 03/28/21 12:14 AM   Specimen: BLOOD RIGHT FOREARM  Result Value Ref Range Status    Specimen Description BLOOD RIGHT FOREARM  Final   Special Requests   Final    BOTTLES DRAWN AEROBIC AND ANAEROBIC Blood Culture adequate volume   Culture   Final    NO GROWTH 2 DAYS Performed at [facility], [place] [address], [place], [place] [address]    Report Status PENDING  Incomplete  Blood culture (routine x 2)     Status: None (Preliminary result)   Collection Time: 03/28/21 12:14 AM   Specimen: BLOOD LEFT FOREARM  Result Value Ref Range Status   Specimen Description BLOOD LEFT FOREARM  Final   Special Requests   Final    BOTTLES DRAWN AEROBIC AND ANAEROBIC Blood Culture results may not be optimal due to an excessive volume of blood received in culture bottles   Culture   Final    NO GROWTH 2 DAYS Performed at [facility], [place] [address], [place], [place] [address]    Report Status PENDING  Incomplete  Resp Panel by RT-PCR (Flu A&B, Covid) Nasopharyngeal Swab     Status: None   Collection Time: 03/28/21 12:22 AM   Specimen: Nasopharyngeal Swab; Nasopharyngeal(NP) swabs in vial transport medium  Result Value Ref Range Status   SARS Coronavirus 2 by RT PCR NEGATIVE NEGATIVE Final    Comment: (NOTE) SARS-CoV-2 target nucleic acids are NOT DETECTED.  The SARS-CoV-2 RNA is generally detectable in upper respiratory specimens during the acute phase of infection. The lowest concentration of SARS-CoV-2 viral copies this assay can detect is 138 copies/mL. A negative result does not preclude SARS-Cov-2 infection and should not be used as the sole basis for treatment or other patient management decisions. A negative result may occur with  improper specimen collection/handling, submission of specimen other than nasopharyngeal swab, presence of viral mutation(s) within the areas targeted by this assay, and inadequate number of viral copies(<138 copies/mL). A negative result must be combined with clinical observations, patient history, and epidemiological information. The  expected result is Negative.  Fact Sheet for Patients:  EntrepreneurPulse.com.au  Fact Sheet for Healthcare Providers:  IncredibleEmployment.be  This test is no t yet approved or cleared by the [place] FDA and  has been authorized for detection and/or diagnosis of SARS-CoV-2 by FDA under an Emergency Use Authorization (EUA). This EUA will remain  in effect (meaning this test can be used) for the duration of the COVID-19 declaration under Section 564(b)(1) of the Act, 21 U.S.C.section 360bbb-3(b)(1), unless the authorization is terminated  or revoked sooner.       Influenza A by PCR NEGATIVE NEGATIVE Final   Influenza B by PCR NEGATIVE NEGATIVE Final    Comment: (NOTE) The Xpert Xpress SARS-CoV-2/FLU/RSV plus assay is intended as an aid in the diagnosis of influenza from Nasopharyngeal swab specimens and should not be used  as a sole basis for treatment. Nasal washings and aspirates are unacceptable for Xpert Xpress SARS-CoV-2/FLU/RSV testing.  Fact Sheet for Patients: EntrepreneurPulse.com.au  Fact Sheet for Healthcare Providers: IncredibleEmployment.be  This test is not yet approved or cleared by the [place] FDA and has been authorized for detection and/or diagnosis of SARS-CoV-2 by FDA under an Emergency Use Authorization (EUA). This EUA will remain in effect (meaning this test can be used) for the duration of the COVID-19 declaration under Section 564(b)(1) of the Act, 21 U.S.C. section 360bbb-3(b)(1), unless the authorization is terminated or revoked.  Performed at [facility], [place] [address], [place], [place] [address]   Aerobic Culture w Gram Stain (superficial specimen)     Status: None (Preliminary result)   Collection Time: 03/28/21  7:02 PM   Specimen: Foot  Result Value Ref Range Status   Specimen Description   Final    FOOT RIGHT Performed at [place] [address] [place], [place] [address]    Special Requests   Final    Immunocompromised Performed at [facility], [place] [address] [place], [place] [address]    Gram Stain   Final    RARE WBC PRESENT, PREDOMINANTLY PMN RARE GRAM POSITIVE COCCI IN PAIRS    Culture   Final    RARE PSEUDOMONAS AERUGINOSA CULTURE REINCUBATED FOR BETTER GROWTH Performed at [name], [place] [address] [place], [place] [address]    Report Status PENDING  Incomplete  Aerobic Culture w Gram Stain (superficial specimen)     Status: None (Preliminary result)   Collection Time: 03/28/21  7:02 PM   Specimen: Foot  Result Value Ref Range Status   Specimen Description   Final    FOOT LEFT Performed at [place] [address] [place], [place] [address]    Special Requests   Final    NONE Performed at [facility], [place] [address] [place], [place] [address]    Gram Stain   Final    MODERATE WBC PRESENT,BOTH PMN AND MONONUCLEAR FEW GRAM NEGATIVE RODS FEW GRAM POSITIVE COCCI IN PAIRS FEW GRAM NEGATIVE COCCI IN PAIRS    Culture   Final    CULTURE REINCUBATED FOR BETTER GROWTH Performed at [facility], [place] [address] [place], [place] [address]    Report Status PENDING  Incomplete    Studies/Results: MR TIBIA FIBULA RIGHT WO CONTRAST  Result Date: 03/30/2021 CLINICAL DATA:  Bilateral lower extremity wounds. Evaluate for osteomyelitis. EXAM: MRI OF LOWER LEFT EXTREMITY WITHOUT CONTRAST MRI OF LOWER RIGHT EXTREMITY WITHOUT CONTRAST TECHNIQUE: Multiplanar, multisequence MR imaging of the left tibia and fibula was performed. No intravenous contrast was administered. Multiplanar, multisequence MR imaging of the right tibia and fibula was performed. No intravenous contrast was administered. COMPARISON:  None. FINDINGS: Bones/Joint/Cartilage No fracture or dislocation. Normal alignment. No joint effusion. Diffuse T1 and T2  hypointense marrow signal throughout the visualized bones consistent with patient's history of sickle cell disease. Serpiginous T2 hyperintense signal within the tibial diaphysis bilaterally consistent with extensive bone infarcts. No periosteal reaction or bone destruction. Ligaments Collateral ligaments are intact.  Lisfranc ligament is intact. Muscles and Tendons Flexor, peroneal and extensor compartment tendons are intact. No muscle atrophy. Mild edema in the left peroneus brevis muscle concerning for mild myositis secondary to an infectious versus inflammatory etiology. Soft tissue Soft tissue wound of the medial and lateral aspect of the distal right lower leg  with surrounding soft tissue edema. Soft tissue wound involving the medial and lateral aspect of the left Ankle and lower leg . No soft tissue mass. IMPRESSION: 1. No evidence of osteomyelitis of bilateral lower legs. 2. 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 3. Mild edema in the left peroneus brevis muscle concerning for mild myositis secondary to an infectious versus inflammatory etiology. 4. Cellulitis around the left ankle with multiple soft tissue wounds. 5. Multiple soft tissue wound around the right lower leg. 6. No drainable fluid collection hematoma. Electronically Signed   By: Kathreen Devoid M.D.   On: 03/30/2021 07:23   MR TIBIA FIBULA LEFT WO CONTRAST  Result Date: 03/30/2021 CLINICAL DATA:  Bilateral lower extremity wounds. Evaluate for osteomyelitis. EXAM: MRI OF LOWER LEFT EXTREMITY WITHOUT CONTRAST MRI OF LOWER RIGHT EXTREMITY WITHOUT CONTRAST TECHNIQUE: Multiplanar, multisequence MR imaging of the left tibia and fibula was performed. No intravenous contrast was administered. Multiplanar, multisequence MR imaging of the right tibia and fibula was performed. No intravenous contrast was administered. COMPARISON:  None. FINDINGS: Bones/Joint/Cartilage No fracture or dislocation. Normal alignment. No joint  effusion. Diffuse T1 and T2 hypointense marrow signal throughout the visualized bones consistent with patient's history of sickle cell disease. Serpiginous T2 hyperintense signal within the tibial diaphysis bilaterally consistent with extensive bone infarcts. No periosteal reaction or bone destruction. Ligaments Collateral ligaments are intact.  Lisfranc ligament is intact. Muscles and Tendons Flexor, peroneal and extensor compartment tendons are intact. No muscle atrophy. Mild edema in the left peroneus brevis muscle concerning for mild myositis secondary to an infectious versus inflammatory etiology. Soft tissue Soft tissue wound of the medial and lateral aspect of the distal right lower leg with surrounding soft tissue edema. Soft tissue wound involving the medial and lateral aspect of the left Ankle and lower leg . No soft tissue mass. IMPRESSION: 1. No evidence of osteomyelitis of bilateral lower legs. 2. 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 3. Mild edema in the left peroneus brevis muscle concerning for mild myositis secondary to an infectious versus inflammatory etiology. 4. Cellulitis around the left ankle with multiple soft tissue wounds. 5. Multiple soft tissue wound around the right lower leg. 6. No drainable fluid collection hematoma. Electronically Signed   By: Kathreen Devoid M.D.   On: 03/30/2021 07:23    Medications: Scheduled Meds:  enoxaparin (LOVENOX) injection  40 mg Subcutaneous Daily   HYDROmorphone   Intravenous Q4H   ketorolac  15 mg Intravenous Q6H   senna-docusate  1 tablet Oral BID   Continuous Infusions:  sodium chloride 10 mL/hr at 03/28/21 1333   ceFEPime (MAXIPIME) IV 2 g (03/30/21 1044)   diphenhydrAMINE     metronidazole 500 mg (03/30/21 0556)   vancomycin 750 mg (03/30/21 1129)   PRN Meds:.acetaminophen, diphenhydrAMINE **OR** diphenhydrAMINE, naloxone **AND** sodium chloride flush, ondansetron (ZOFRAN) IV, oxyCODONE, polyethylene  glycol  Consultants: Wound care consult Transition of care consult  Procedures: MRI lower extremities  Antibiotics: IV cefepime IV metronidazole IV vancomycin  Assessment/Plan: Principal Problem:   Sickle cell pain crisis ([place]) Active Problems:   Symptomatic anemia   Bilateral leg ulcer (HCC)  Sickle cell disease with pain crisis: Will continue IV Dilaudid PCA without any changes in settings today Oxycodone 10 mg every 4 hours as needed for severe breakthrough pain Toradol 15 mg IV every 6 hours for total of 5 days Monitor vital signs very closely, reevaluate pain scale regularly, and supplemental oxygen as needed  Bilateral leg ulcers:  Patient previously had chronic RLE leg ulcers, over the past several months she has developed leg ulcers to LLE as well.  On x-ray, and showed: LLE, no soft tissue emphysema or acute osseous finding at the lower leg wound.  Right ankle shows soft wounds along the bilateral ankle.  Associated periosteal reaction raises possibility of chronic osteomyelitis.  No acute osseous destruction.  Left view shows large soft tissue along the posterior aspect of the ankle.  No suggestion of acute osteomyelitis. Continue IV antibiotics.  Follow wound cultures, will de-escalate antibiotics as wound culture results become available.  Gram stain of left wound culture shows moderate WBCs, few gram-negative rods, gram-positive cocci in pairs, and gram-negative cocci in pairs.  Right gram stain shows gram-positive cocci in pairs.  Anemia of chronic disease: On admission, patient's hemoglobin was 4.7 g/dL.  She is status post 2 units PRBCs.  Today, hemoglobin is 7.0 g/dL.  We will continue to follow closely.  We will transfuse additional units if hemoglobin is less than 7. Follow CBC in AM. Right and left MRI showed no evidence of osteomyelitis of bilateral lower legs.  Extensive bone infarcts throughout tubule diaphysis bilaterally.  Mild edema in the left peroneus  brevis muscle concerning for mild myositis secondary to infectious versus inflammatory etiology.  Cellulitis around left ankle with multiple soft tissue wounds.  Multiple soft tissue wound around right lower leg.  No drainable fluid collection hematoma noted.  Leukocytosis: Stable.  Patient afebrile overnight.  Blood cultures negative. Code Status: Full Code Family Communication: N/A Disposition Plan: Not yet ready for discharge   [place], MSN, FNP-C Patient [place] [address] [place], [place] [address] [phone]  If 5PM-8AM, please contact night-coverage.  03/30/2021, 5:11 PM  LOS: 2 days

## 2021-03-31 DIAGNOSIS — L089 Local infection of the skin and subcutaneous tissue, unspecified: Secondary | ICD-10-CM

## 2021-03-31 LAB — AEROBIC CULTURE W GRAM STAIN (SUPERFICIAL SPECIMEN)

## 2021-03-31 LAB — TYPE AND SCREEN
ABO/RH(D): B POS
Antibody Screen: NEGATIVE
Donor AG Type: NEGATIVE
Unit division: 0

## 2021-03-31 LAB — CREATININE, SERUM
Creatinine, Ser: 0.4 mg/dL — ABNORMAL LOW (ref 0.44–1.00)
GFR, Estimated: >60 mL/min (ref >60)

## 2021-03-31 LAB — CBC
HCT: 23.1 % — ABNORMAL LOW (ref 36.0–46.0)
Hemoglobin: 7.9 g/dL — ABNORMAL LOW (ref 12.0–15.0)
MCH: 30.7 pg (ref 26.0–34.0)
MCHC: 34.2 g/dL (ref 30.0–36.0)
MCV: 89.9 fL (ref 80.0–100.0)
Platelets: 324 10*3/uL (ref 150–400)
RBC: 2.57 MIL/uL — ABNORMAL LOW (ref 3.87–5.11)
RDW: 17.8 % — ABNORMAL HIGH (ref 11.5–15.5)
WBC: 9.2 10*3/uL (ref 4.0–10.5)
nRBC: 2.5 % — ABNORMAL HIGH (ref 0.0–0.2)

## 2021-03-31 LAB — BPAM RBC
Blood Product Expiration Date: 202212122359
ISSUE DATE / TIME: 202211111440
Unit Type and Rh: 5100

## 2021-03-31 NOTE — Progress Notes (Signed)
Patient ID: [name], female   DOB: 1971-03-30, 50 y.o.   MRN: [id number] Subjective: [name] is a 50 year old female with a medical history significant for sickle cell disease type SS, chronic pain syndrome, opiate dependence and tolerance, and chronic bilateral leg ulcers was admitted for lower extremities wound infection and sickle cell pain crisis.  Patient feels her pain is slightly improved but still significant especially in her right lower extremity and with the wound.  She is not ambulating well due to pain.  Patient is status post transfusion of  packed red blood cell. She denies any fever, cough, chest pain, shortness of breath, nausea, vomiting or diarrhea.  No urinary symptoms.  Objective:  Vital signs in last 24 hours:  Vitals:   03/31/21 0828 03/31/21 1051 03/31/21 1158 03/31/21 1200  BP:  111/66    Pulse:  (!) 58    Resp: 14 14 20 20   Temp:  98.2 F (36.8 C)    TempSrc:  Oral    SpO2: 99% 100% 99% 99%  Weight:        Intake/Output from previous day:   Intake/Output Summary (Last 24 hours) at 03/31/2021 1235 Last data filed at 03/31/2021 1052 Gross per 24 hour  Intake 1121.33 ml  Output 1600 ml  Net -478.67 ml    Physical Exam: General: Alert, awake, oriented x3, in no acute distress.  HEENT: [place]/AT PEERL, EOMI Neck: Trachea midline,  no masses, no thyromegal,y no JVD, no carotid bruit OROPHARYNX:  Moist, No exudate/ erythema/lesions.  Heart: Regular rate and rhythm, without murmurs, rubs, gallops, PMI non-displaced, no heaves or thrills on palpation.  Lungs: Clear to auscultation, no wheezing or rhonchi noted. No increased vocal fremitus resonant to percussion  Abdomen: Soft, nontender, nondistended, positive bowel sounds, no masses no hepatosplenomegaly noted..  Neuro: No focal neurological deficits noted cranial nerves II through XII grossly intact. DTRs 2+ bilaterally upper and lower extremities. Strength 5 out of 5 in bilateral upper and lower  extremities. Musculoskeletal: Bilateral lower extremities wounds, dressing in situ, no discharges noted. No warm swelling or erythema around joints, no spinal tenderness noted. Psychiatric: Patient alert and oriented x3, good insight and cognition, good recent to remote recall. Lymph node survey: No cervical axillary or inguinal lymphadenopathy noted.  Lab Results:  Basic Metabolic Panel:    Component Value Date/Time   NA 138 03/29/2021 0616   NA 139 11/08/2020 0948   NA 139 01/20/2014 0758   K 4.2 03/29/2021 0616   K 3.9 01/20/2014 0758   CL 109 03/29/2021 0616   CO2 24 03/29/2021 0616   CO2 23 01/20/2014 0758   BUN 5 (L) 03/29/2021 0616   BUN 5 (L) 11/08/2020 0948   BUN 6.0 (L) 01/20/2014 0758   CREATININE 0.40 (L) 03/31/2021 0655   CREATININE 0.30 (L) 12/20/2016 0925   CREATININE 0.5 (L) 01/20/2014 0758   GLUCOSE 85 03/29/2021 0616   GLUCOSE 72 01/20/2014 0758   CALCIUM 8.3 (L) 03/29/2021 0616   CALCIUM 9.0 01/20/2014 0758   CBC:    Component Value Date/Time   WBC 9.2 03/31/2021 0655   HGB 7.9 (L) 03/31/2021 0655   HGB 5.7 (LL) 11/08/2020 0948   HGB 8.7 (L) 01/20/2014 0757   HCT 23.1 (L) 03/31/2021 0655   HCT 16.0 (LL) 11/08/2020 0948   HCT 25.2 (L) 01/20/2014 0757   PLT 324 03/31/2021 0655   PLT 464 (H) 11/08/2020 0948   MCV 89.9 03/31/2021 0655   MCV 88 11/08/2020 0948  MCV 84.3 01/20/2014 0757   NEUTROABS 7.5 03/30/2021 0511   NEUTROABS 9.3 (H) 11/08/2020 0948   NEUTROABS 8.7 (H) 01/20/2014 0757   LYMPHSABS 1.9 03/30/2021 0511   LYMPHSABS 2.3 11/08/2020 0948   LYMPHSABS 2.7 01/20/2014 0757   MONOABS 1.9 (H) 03/30/2021 0511   MONOABS 1.9 (H) 01/20/2014 0757   EOSABS 1.0 (H) 03/30/2021 0511   EOSABS 0.5 (H) 11/08/2020 0948   BASOSABS 0.1 03/30/2021 0511   BASOSABS 0.1 11/08/2020 0948   BASOSABS 0.2 (H) 01/20/2014 0757    Recent Results (from the past 240 hour(s))  Blood culture (routine x 2)     Status: None (Preliminary result)   Collection Time:  03/28/21 12:14 AM   Specimen: BLOOD RIGHT FOREARM  Result Value Ref Range Status   Specimen Description BLOOD RIGHT FOREARM  Final   Special Requests   Final    BOTTLES DRAWN AEROBIC AND ANAEROBIC Blood Culture adequate volume   Culture   Final    NO GROWTH 3 DAYS Performed at [facility], [address] [place], [place] [address]    Report Status PENDING  Incomplete  Blood culture (routine x 2)     Status: None (Preliminary result)   Collection Time: 03/28/21 12:14 AM   Specimen: BLOOD LEFT FOREARM  Result Value Ref Range Status   Specimen Description BLOOD LEFT FOREARM  Final   Special Requests   Final    BOTTLES DRAWN AEROBIC AND ANAEROBIC Blood Culture results may not be optimal due to an excessive volume of blood received in culture bottles   Culture   Final    NO GROWTH 3 DAYS Performed at [facility], [place] [address], [place], [place] [address]    Report Status PENDING  Incomplete  Resp Panel by RT-PCR (Flu A&B, Covid) Nasopharyngeal Swab     Status: None   Collection Time: 03/28/21 12:22 AM   Specimen: Nasopharyngeal Swab; Nasopharyngeal(NP) swabs in vial transport medium  Result Value Ref Range Status   SARS Coronavirus 2 by RT PCR NEGATIVE NEGATIVE Final    Comment: (NOTE) SARS-CoV-2 target nucleic acids are NOT DETECTED.  The SARS-CoV-2 RNA is generally detectable in upper respiratory specimens during the acute phase of infection. The lowest concentration of SARS-CoV-2 viral copies this assay can detect is 138 copies/mL. A negative result does not preclude SARS-Cov-2 infection and should not be used as the sole basis for treatment or other patient management decisions. A negative result may occur with  improper specimen collection/handling, submission of specimen other than nasopharyngeal swab, presence of viral mutation(s) within the areas targeted by this assay, and inadequate number of viral copies(<138 copies/mL). A negative result must be combined  with clinical observations, patient history, and epidemiological information. The expected result is Negative.  Fact Sheet for Patients:  EntrepreneurPulse.com.au  Fact Sheet for Healthcare Providers:  IncredibleEmployment.be  This test is no t yet approved or cleared by the [place] FDA and  has been authorized for detection and/or diagnosis of SARS-CoV-2 by FDA under an Emergency Use Authorization (EUA). This EUA will remain  in effect (meaning this test can be used) for the duration of the COVID-19 declaration under Section 564(b)(1) of the Act, 21 U.S.C.section 360bbb-3(b)(1), unless the authorization is terminated  or revoked sooner.       Influenza A by PCR NEGATIVE NEGATIVE Final   Influenza B by PCR NEGATIVE NEGATIVE Final    Comment: (NOTE) The Xpert Xpress SARS-CoV-2/FLU/RSV plus assay is intended as  an aid in the diagnosis of influenza from Nasopharyngeal swab specimens and should not be used as a sole basis for treatment. Nasal washings and aspirates are unacceptable for Xpert Xpress SARS-CoV-2/FLU/RSV testing.  Fact Sheet for Patients: EntrepreneurPulse.com.au  Fact Sheet for Healthcare Providers: IncredibleEmployment.be  This test is not yet approved or cleared by the [place] FDA and has been authorized for detection and/or diagnosis of SARS-CoV-2 by FDA under an Emergency Use Authorization (EUA). This EUA will remain in effect (meaning this test can be used) for the duration of the COVID-19 declaration under Section 564(b)(1) of the Act, 21 U.S.C. section 360bbb-3(b)(1), unless the authorization is terminated or revoked.  Performed at [facility], [place] [address] [place], [place] [address]   Aerobic Culture w Gram Stain (superficial specimen)     Status: None (Preliminary result)   Collection Time: 03/28/21  7:02 PM   Specimen: Foot  Result Value Ref Range Status    Specimen Description   Final    FOOT RIGHT Performed at [place] [address], [place], [place] [address]    Special Requests   Final    Immunocompromised Performed at [facility], [place] [address], [place], [place] [address]    Gram Stain   Final    RARE WBC PRESENT, PREDOMINANTLY PMN RARE GRAM POSITIVE COCCI IN PAIRS    Culture   Final    RARE PSEUDOMONAS AERUGINOSA SUSCEPTIBILITIES TO FOLLOW Performed at [facility], [place] [address] [place], [place] [address]    Report Status PENDING  Incomplete  Aerobic Culture w Gram Stain (superficial specimen)     Status: Abnormal   Collection Time: 03/28/21  7:02 PM   Specimen: Foot  Result Value Ref Range Status   Specimen Description   Final    FOOT LEFT Performed at [place] [address], [place], [place] [address]    Special Requests   Final    NONE Performed at [facility], [place] [address] [place], [place] [address]    Gram Stain   Final    MODERATE WBC PRESENT,BOTH PMN AND MONONUCLEAR FEW GRAM NEGATIVE RODS FEW GRAM POSITIVE COCCI IN PAIRS FEW GRAM NEGATIVE COCCI IN PAIRS    Culture (A)  Final    MULTIPLE ORGANISMS PRESENT, NONE PREDOMINANT NO GROUP A STREP (S.PYOGENES) ISOLATED NO STAPHYLOCOCCUS AUREUS ISOLATED Performed at [facility], [place] [address], [place], [place] [address]    Report Status 03/31/2021 FINAL  Final    Studies/Results: MR TIBIA FIBULA RIGHT WO CONTRAST  Result Date: 03/30/2021 CLINICAL DATA:  Bilateral lower extremity wounds. Evaluate for osteomyelitis. EXAM: MRI OF LOWER LEFT EXTREMITY WITHOUT CONTRAST MRI OF LOWER RIGHT EXTREMITY WITHOUT CONTRAST TECHNIQUE: Multiplanar, multisequence MR imaging of the left tibia and fibula was performed. No intravenous contrast was administered. Multiplanar, multisequence MR imaging of the right tibia and fibula was performed. No intravenous contrast was  administered. COMPARISON:  None. FINDINGS: Bones/Joint/Cartilage No fracture or dislocation. Normal alignment. No joint effusion. Diffuse T1 and T2 hypointense marrow signal throughout the visualized bones consistent with patient's history of sickle cell disease. Serpiginous T2 hyperintense signal within the tibial diaphysis bilaterally consistent with extensive bone infarcts. No periosteal reaction or bone destruction. Ligaments Collateral ligaments are intact.  Lisfranc ligament is intact. Muscles and Tendons Flexor, peroneal and extensor compartment tendons are intact. No muscle atrophy. Mild edema in the left peroneus brevis muscle concerning for mild myositis secondary  to an infectious versus inflammatory etiology. Soft tissue Soft tissue wound of the medial and lateral aspect of the distal right lower leg with surrounding soft tissue edema. Soft tissue wound involving the medial and lateral aspect of the left Ankle and lower leg . No soft tissue mass. IMPRESSION: 1. No evidence of osteomyelitis of bilateral lower legs. 2. 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 3. Mild edema in the left peroneus brevis muscle concerning for mild myositis secondary to an infectious versus inflammatory etiology. 4. Cellulitis around the left ankle with multiple soft tissue wounds. 5. Multiple soft tissue wound around the right lower leg. 6. No drainable fluid collection hematoma. Electronically Signed   By: Kathreen Devoid M.D.   On: 03/30/2021 07:23   MR TIBIA FIBULA LEFT WO CONTRAST  Result Date: 03/30/2021 CLINICAL DATA:  Bilateral lower extremity wounds. Evaluate for osteomyelitis. EXAM: MRI OF LOWER LEFT EXTREMITY WITHOUT CONTRAST MRI OF LOWER RIGHT EXTREMITY WITHOUT CONTRAST TECHNIQUE: Multiplanar, multisequence MR imaging of the left tibia and fibula was performed. No intravenous contrast was administered. Multiplanar, multisequence MR imaging of the right tibia and fibula was performed. No  intravenous contrast was administered. COMPARISON:  None. FINDINGS: Bones/Joint/Cartilage No fracture or dislocation. Normal alignment. No joint effusion. Diffuse T1 and T2 hypointense marrow signal throughout the visualized bones consistent with patient's history of sickle cell disease. Serpiginous T2 hyperintense signal within the tibial diaphysis bilaterally consistent with extensive bone infarcts. No periosteal reaction or bone destruction. Ligaments Collateral ligaments are intact.  Lisfranc ligament is intact. Muscles and Tendons Flexor, peroneal and extensor compartment tendons are intact. No muscle atrophy. Mild edema in the left peroneus brevis muscle concerning for mild myositis secondary to an infectious versus inflammatory etiology. Soft tissue Soft tissue wound of the medial and lateral aspect of the distal right lower leg with surrounding soft tissue edema. Soft tissue wound involving the medial and lateral aspect of the left Ankle and lower leg . No soft tissue mass. IMPRESSION: 1. No evidence of osteomyelitis of bilateral lower legs. 2. 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 3. Mild edema in the left peroneus brevis muscle concerning for mild myositis secondary to an infectious versus inflammatory etiology. 4. Cellulitis around the left ankle with multiple soft tissue wounds. 5. Multiple soft tissue wound around the right lower leg. 6. No drainable fluid collection hematoma. Electronically Signed   By: Kathreen Devoid M.D.   On: 03/30/2021 07:23    Medications: Scheduled Meds:  enoxaparin (LOVENOX) injection  40 mg Subcutaneous Daily   HYDROmorphone   Intravenous Q4H   ketorolac  15 mg Intravenous Q6H   senna-docusate  1 tablet Oral BID   Continuous Infusions:  ceFEPime (MAXIPIME) IV 2 g (03/31/21 0941)   diphenhydrAMINE     metronidazole 500 mg (03/31/21 0609)   vancomycin 750 mg (03/31/21 1148)   PRN Meds:.acetaminophen, diphenhydrAMINE **OR**  diphenhydrAMINE, naloxone **AND** sodium chloride flush, ondansetron (ZOFRAN) IV, oxyCODONE, polyethylene glycol  Consultants: None  Procedures: None  Antibiotics: IV Cefepime IV Metronidazole IV Vancomycin  Assessment/Plan: Principal Problem:   Sickle cell pain crisis ([place]) Active Problems:   Symptomatic anemia   Bilateral leg ulcer (HCC)  Bilateral leg ulcers: X-ray and MRI reviewed by me.  MRI showed no evidence of osteomyelitis of bilateral lower extremities.  There is however extensive bone infarcts throughout tubule diaphysis bilaterally.  Mild edema in the left peroneus brevis muscle concerning for mild myositis secondary to infection versus inflammatory etiology.  Cellulitis around  left ankle with multiple soft tissue wounds.  Patient is on IV cefepime, metronidazole and vancomycin.  Wound cultures reviewed.  No Staph aureus or MRSA.  Will discontinue vancomycin today.  Continue other antibiotics and wound dressing. Hb Sickle Cell Disease with crisis: Continue IVF at [facility], continue weight based Dilaudid PCA at current setting, continue IV Toradol 15 mg Q 6 H for total of 5 days, continue oral home medications as ordered.  Monitor vitals very closely, Re-evaluate pain scale regularly, 2 L of Oxygen by [place]. Sickle Cell Anemia: Patient is status post transfusion of packed red blood cell.  Hemoglobin is now stable, up to 7.9 today.  We will continue to monitor closely and to promote wound healing, will transfuse whenever hemoglobin drops below 7. Chronic pain Syndrome: Continue home medications.  Code Status: Full Code Family Communication: N/A Disposition Plan: Not yet ready for discharge  Garron Eline  If 7PM-7AM, please contact night-coverage.  03/31/2021, 12:35 PM  LOS: 3 days

## 2021-04-01 LAB — CBC
HCT: 23.5 % — ABNORMAL LOW (ref 36.0–46.0)
Hemoglobin: 7.9 g/dL — ABNORMAL LOW (ref 12.0–15.0)
MCH: 30.5 pg (ref 26.0–34.0)
MCHC: 33.6 g/dL (ref 30.0–36.0)
MCV: 90.7 fL (ref 80.0–100.0)
Platelets: 330 10*3/uL (ref 150–400)
RBC: 2.59 MIL/uL — ABNORMAL LOW (ref 3.87–5.11)
RDW: 18.1 % — ABNORMAL HIGH (ref 11.5–15.5)
WBC: 8.6 10*3/uL (ref 4.0–10.5)
nRBC: 1.2 % — ABNORMAL HIGH (ref 0.0–0.2)

## 2021-04-01 LAB — AEROBIC CULTURE W GRAM STAIN (SUPERFICIAL SPECIMEN)

## 2021-04-01 MED ORDER — "THROMBI-PAD 3""X3"" EX PADS"
1.0000 | MEDICATED_PAD | Freq: Once | CUTANEOUS | Status: AC
  Administered 2021-04-02: 1 via TOPICAL
  Filled 2021-04-01: qty 1

## 2021-04-01 NOTE — Progress Notes (Signed)
   04/01/21 1615  Notify: Provider  Provider Name/Title Dr Doreene Burke  Date Provider Notified 04/01/21  Time Provider Notified 1615  Notification Type Call  Notification Reason Change in status (Bleeding from wound)  Provider response See new orders  Date of Provider Response 04/01/21  Time of Provider Response [id number]  Document  Patient Outcome Stabilized after interventions  Progress note created (see row info) Yes

## 2021-04-01 NOTE — Significant Event (Signed)
Was notified by the patient's nurse that patient was profusely bleeding from the left leg wound around the posterior aspect of the left ankle for the last 20 minutes.  Reviewed patient's labs and medications.  Though Lovenox was in the medication list,  patient was not given Lovenox.  It has been discontinued now.  Patient is not on any other antiplatelet agents or anticoagulants.  Patient's bleeding is pulsatile.  Patient has been bleeding despite continuous compression.  Patient blood pressure systolic in the 59F heart rate around 100/min.  Patient was started on fluid bolus stat CBC type and screen INR has been ordered I discussed with on-call vascular surgeon Dr. Donzetta Matters who will be seeing patient in consult.  Gean Birchwood

## 2021-04-01 NOTE — Progress Notes (Signed)
This RN was able to change patient's left leg dressing this am, but she did not want me to change her RLE. She reports significant pain with dressing changes. She will notify day RN when she is ready for second dressing change.[name]

## 2021-04-01 NOTE — Progress Notes (Signed)
Patient ID: [name], female   DOB: 08/04/1970, 50 y.o.   MRN: [id number] Subjective: [name] is a 50 year old female with a medical history significant for sickle cell disease type SS, chronic pain syndrome, opiate dependence and tolerance, and chronic bilateral leg ulcers was admitted for lower extremities wound infection and sickle cell pain crisis.  Patient has no new complaint today except that she continues to have significant pain in her lower extremities especially on the right side.  She is still not able to bear weight without inciting significant pain that impedes ambulation.  She denies any fever.  She has no cough or shortness of breath.  She denies any nausea, vomiting or diarrhea  Objective:  Vital signs in last 24 hours:  Vitals:   04/01/21 0744 04/01/21 1038 04/01/21 1149 04/01/21 1154  BP:  112/66    Pulse:  (!) 57    Resp: 14 14 15 15   Temp:  98.1 F (36.7 C)    TempSrc:  Oral    SpO2: 99% 100% 99% 99%  Weight:        Intake/Output from previous day:   Intake/Output Summary (Last 24 hours) at 04/01/2021 1448 Last data filed at 04/01/2021 0700 Gross per 24 hour  Intake 652 ml  Output 1100 ml  Net -448 ml     Physical Exam: General: Alert, awake, oriented x3, in no acute distress.  HEENT: [place]/AT PEERL, EOMI Neck: Trachea midline,  no masses, no thyromegal,y no JVD, no carotid bruit OROPHARYNX:  Moist, No exudate/ erythema/lesions.  Heart: Regular rate and rhythm, without murmurs, rubs, gallops, PMI non-displaced, no heaves or thrills on palpation.  Lungs: Clear to auscultation, no wheezing or rhonchi noted. No increased vocal fremitus resonant to percussion  Abdomen: Soft, nontender, nondistended, positive bowel sounds, no masses no hepatosplenomegaly noted..  Neuro: No focal neurological deficits noted cranial nerves II through XII grossly intact. DTRs 2+ bilaterally upper and lower extremities. Strength 5 out of 5 in bilateral upper and lower  extremities. Musculoskeletal: Bilateral lower extremities wounds, dressing in situ, no discharges noted. No warm swelling or erythema around joints, no spinal tenderness noted. Psychiatric: Patient alert and oriented x3, good insight and cognition, good recent to remote recall. Lymph node survey: No cervical axillary or inguinal lymphadenopathy noted.  Lab Results:  Basic Metabolic Panel:    Component Value Date/Time   NA 138 03/29/2021 0616   NA 139 11/08/2020 0948   NA 139 01/20/2014 0758   K 4.2 03/29/2021 0616   K 3.9 01/20/2014 0758   CL 109 03/29/2021 0616   CO2 24 03/29/2021 0616   CO2 23 01/20/2014 0758   BUN 5 (L) 03/29/2021 0616   BUN 5 (L) 11/08/2020 0948   BUN 6.0 (L) 01/20/2014 0758   CREATININE 0.40 (L) 03/31/2021 0655   CREATININE 0.30 (L) 12/20/2016 0925   CREATININE 0.5 (L) 01/20/2014 0758   GLUCOSE 85 03/29/2021 0616   GLUCOSE 72 01/20/2014 0758   CALCIUM 8.3 (L) 03/29/2021 0616   CALCIUM 9.0 01/20/2014 0758   CBC:    Component Value Date/Time   WBC 8.6 04/01/2021 0606   HGB 7.9 (L) 04/01/2021 0606   HGB 5.7 (LL) 11/08/2020 0948   HGB 8.7 (L) 01/20/2014 0757   HCT 23.5 (L) 04/01/2021 0606   HCT 16.0 (LL) 11/08/2020 0948   HCT 25.2 (L) 01/20/2014 0757   PLT 330 04/01/2021 0606   PLT 464 (H) 11/08/2020 0948   MCV 90.7 04/01/2021 0606   MCV 88  11/08/2020 0948   MCV 84.3 01/20/2014 0757   NEUTROABS 7.5 03/30/2021 0511   NEUTROABS 9.3 (H) 11/08/2020 0948   NEUTROABS 8.7 (H) 01/20/2014 0757   LYMPHSABS 1.9 03/30/2021 0511   LYMPHSABS 2.3 11/08/2020 0948   LYMPHSABS 2.7 01/20/2014 0757   MONOABS 1.9 (H) 03/30/2021 0511   MONOABS 1.9 (H) 01/20/2014 0757   EOSABS 1.0 (H) 03/30/2021 0511   EOSABS 0.5 (H) 11/08/2020 0948   BASOSABS 0.1 03/30/2021 0511   BASOSABS 0.1 11/08/2020 0948   BASOSABS 0.2 (H) 01/20/2014 0757    Recent Results (from the past 240 hour(s))  Blood culture (routine x 2)     Status: None (Preliminary result)   Collection Time:  03/28/21 12:14 AM   Specimen: BLOOD RIGHT FOREARM  Result Value Ref Range Status   Specimen Description BLOOD RIGHT FOREARM  Final   Special Requests   Final    BOTTLES DRAWN AEROBIC AND ANAEROBIC Blood Culture adequate volume   Culture   Final    NO GROWTH 4 DAYS Performed at [facility], [address] [place], [place] [address]    Report Status PENDING  Incomplete  Blood culture (routine x 2)     Status: None (Preliminary result)   Collection Time: 03/28/21 12:14 AM   Specimen: BLOOD LEFT FOREARM  Result Value Ref Range Status   Specimen Description BLOOD LEFT FOREARM  Final   Special Requests   Final    BOTTLES DRAWN AEROBIC AND ANAEROBIC Blood Culture results may not be optimal due to an excessive volume of blood received in culture bottles   Culture   Final    NO GROWTH 4 DAYS Performed at [facility], [place] [address], [place], [place] [address]    Report Status PENDING  Incomplete  Resp Panel by RT-PCR (Flu A&B, Covid) Nasopharyngeal Swab     Status: None   Collection Time: 03/28/21 12:22 AM   Specimen: Nasopharyngeal Swab; Nasopharyngeal(NP) swabs in vial transport medium  Result Value Ref Range Status   SARS Coronavirus 2 by RT PCR NEGATIVE NEGATIVE Final    Comment: (NOTE) SARS-CoV-2 target nucleic acids are NOT DETECTED.  The SARS-CoV-2 RNA is generally detectable in upper respiratory specimens during the acute phase of infection. The lowest concentration of SARS-CoV-2 viral copies this assay can detect is 138 copies/mL. A negative result does not preclude SARS-Cov-2 infection and should not be used as the sole basis for treatment or other patient management decisions. A negative result may occur with  improper specimen collection/handling, submission of specimen other than nasopharyngeal swab, presence of viral mutation(s) within the areas targeted by this assay, and inadequate number of viral copies(<138 copies/mL). A negative result must be combined  with clinical observations, patient history, and epidemiological information. The expected result is Negative.  Fact Sheet for Patients:  EntrepreneurPulse.com.au  Fact Sheet for Healthcare Providers:  IncredibleEmployment.be  This test is no t yet approved or cleared by the [place] FDA and  has been authorized for detection and/or diagnosis of SARS-CoV-2 by FDA under an Emergency Use Authorization (EUA). This EUA will remain  in effect (meaning this test can be used) for the duration of the COVID-19 declaration under Section 564(b)(1) of the Act, 21 U.S.C.section 360bbb-3(b)(1), unless the authorization is terminated  or revoked sooner.       Influenza A by PCR NEGATIVE NEGATIVE Final   Influenza B by PCR NEGATIVE NEGATIVE Final    Comment: (NOTE) The Xpert Xpress SARS-CoV-2/FLU/RSV plus  assay is intended as an aid in the diagnosis of influenza from Nasopharyngeal swab specimens and should not be used as a sole basis for treatment. Nasal washings and aspirates are unacceptable for Xpert Xpress SARS-CoV-2/FLU/RSV testing.  Fact Sheet for Patients: EntrepreneurPulse.com.au  Fact Sheet for Healthcare Providers: IncredibleEmployment.be  This test is not yet approved or cleared by the [place] FDA and has been authorized for detection and/or diagnosis of SARS-CoV-2 by FDA under an Emergency Use Authorization (EUA). This EUA will remain in effect (meaning this test can be used) for the duration of the COVID-19 declaration under Section 564(b)(1) of the Act, 21 U.S.C. section 360bbb-3(b)(1), unless the authorization is terminated or revoked.  Performed at [facility], [place] [address] [place], [place] [address]   Aerobic Culture w Gram Stain (superficial specimen)     Status: None   Collection Time: 03/28/21  7:02 PM   Specimen: Foot  Result Value Ref Range Status   Specimen Description    Final    FOOT RIGHT Performed at [place] [address] [place], [place] [address]    Special Requests   Final    Immunocompromised Performed at [facility], [place] [address], [place], [place] [address]    Gram Stain   Final    RARE WBC PRESENT, PREDOMINANTLY PMN RARE GRAM POSITIVE COCCI IN PAIRS Performed at [facility], [place] [address] [place], [place] [address]    Culture RARE PSEUDOMONAS AERUGINOSA  Final   Report Status 04/01/2021 FINAL  Final   Organism ID, Bacteria PSEUDOMONAS AERUGINOSA  Final      Susceptibility   Pseudomonas aeruginosa - MIC*    CEFTAZIDIME 4 SENSITIVE Sensitive     CIPROFLOXACIN <=0.25 SENSITIVE Sensitive     GENTAMICIN <=1 SENSITIVE Sensitive     IMIPENEM 1 SENSITIVE Sensitive     PIP/TAZO 16 SENSITIVE Sensitive     CEFEPIME 2 SENSITIVE Sensitive     * RARE PSEUDOMONAS AERUGINOSA  Aerobic Culture w Gram Stain (superficial specimen)     Status: Abnormal   Collection Time: 03/28/21  7:02 PM   Specimen: Foot  Result Value Ref Range Status   Specimen Description   Final    FOOT LEFT Performed at [facility], [place] [address] [place], [place] [address]    Special Requests   Final    NONE Performed at [facility], [place] [address], [place], [place] [address]    Gram Stain   Final    MODERATE WBC PRESENT,BOTH PMN AND MONONUCLEAR FEW GRAM NEGATIVE RODS FEW GRAM POSITIVE COCCI IN PAIRS FEW GRAM NEGATIVE COCCI IN PAIRS    Culture (A)  Final    MULTIPLE ORGANISMS PRESENT, NONE PREDOMINANT NO GROUP A STREP (S.PYOGENES) ISOLATED NO STAPHYLOCOCCUS AUREUS ISOLATED Performed at [facility], [place] [address] [place], [place] [address]    Report Status 03/31/2021 FINAL  Final    Studies/Results: No results found.  Medications: Scheduled Meds:  enoxaparin (LOVENOX) injection  40 mg Subcutaneous Daily   HYDROmorphone   Intravenous Q4H   ketorolac  15 mg  Intravenous Q6H   senna-docusate  1 tablet Oral BID   Continuous Infusions:  ceFEPime (MAXIPIME) IV 2 g (04/01/21 0959)   diphenhydrAMINE     metronidazole 500 mg (04/01/21 0523)   PRN Meds:.acetaminophen, diphenhydrAMINE **OR** diphenhydrAMINE, naloxone **AND** sodium chloride flush, ondansetron (ZOFRAN) IV, oxyCODONE, polyethylene glycol  Consultants: None  Procedures: None  Antibiotics:  IV Cefepime IV Metronidazole IV Vancomycin discontinued yesterday.  Assessment/Plan: Principal Problem:   Sickle cell pain crisis ([place]) Active Problems:   Complicated wound infection   Low hemoglobin   Bilateral leg ulcer (HCC)  Bilateral leg ulcers: X-ray and MRI reviewed.  No osteomyelitis on MRI.  We will continue patient on IV cefepime and metronidazole. Continue other supportive care and wound dressing.  Patient may need a wheelchair upon discharge.  TOC consult was placed previously. Hb Sickle Cell Disease with crisis: IVF at [facility], continue weight based Dilaudid PCA at current setting, continue IV Toradol 15 mg Q 6 H for total of 5 days, continue oral home medications as ordered.  Monitor vitals very closely, Re-evaluate pain scale regularly, 2 L of Oxygen by [place]. Sickle Cell Anemia: Patient is status post transfusion of packed red blood cell. Hemoglobin remained stable at baseline, 7.9 today unchanged from yesterday. We will continue to monitor closely and to promote wound healing, will transfuse whenever hemoglobin drops below 7. Chronic pain Syndrome: Continue home medications.  Code Status: Full Code Family Communication: N/A Disposition Plan: Not yet ready for discharge  Emitt Maglione  If 7PM-7AM, please contact night-coverage.  04/01/2021, 2:48 PM  LOS: 4 days

## 2021-04-02 DIAGNOSIS — D5700 Hb-SS disease with crisis, unspecified: Secondary | ICD-10-CM | POA: Diagnosis not present

## 2021-04-02 DIAGNOSIS — R58 Hemorrhage, not elsewhere classified: Secondary | ICD-10-CM | POA: Diagnosis not present

## 2021-04-02 DIAGNOSIS — D57419 Sickle-cell thalassemia with crisis, unspecified: Secondary | ICD-10-CM | POA: Diagnosis not present

## 2021-04-02 DIAGNOSIS — Z7952 Long term (current) use of systemic steroids: Secondary | ICD-10-CM | POA: Diagnosis not present

## 2021-04-02 DIAGNOSIS — Z79899 Other long term (current) drug therapy: Secondary | ICD-10-CM | POA: Diagnosis not present

## 2021-04-02 LAB — CBC WITH DIFFERENTIAL/PLATELET
Abs Immature Granulocytes: 0.08 10*3/uL — ABNORMAL HIGH (ref 0.00–0.07)
Abs Immature Granulocytes: 0.09 10*3/uL — ABNORMAL HIGH (ref 0.00–0.07)
Basophils Absolute: 0.1 10*3/uL (ref 0.0–0.1)
Basophils Absolute: 0.1 10*3/uL (ref 0.0–0.1)
Basophils Relative: 1 %
Basophils Relative: 1 %
Eosinophils Absolute: 0.2 10*3/uL (ref 0.0–0.5)
Eosinophils Absolute: 1.3 10*3/uL — ABNORMAL HIGH (ref 0.0–0.5)
Eosinophils Relative: 1 %
Eosinophils Relative: 8 %
HCT: 18.2 % — ABNORMAL LOW (ref 36.0–46.0)
HCT: 19.9 % — ABNORMAL LOW (ref 36.0–46.0)
Hemoglobin: 6 g/dL — CL (ref 12.0–15.0)
Hemoglobin: 6.8 g/dL — CL (ref 12.0–15.0)
Immature Granulocytes: 1 %
Immature Granulocytes: 1 %
Lymphocytes Relative: 23 %
Lymphocytes Relative: 8 %
Lymphs Abs: 1.5 10*3/uL (ref 0.7–4.0)
Lymphs Abs: 3.8 10*3/uL (ref 0.7–4.0)
MCH: 30.2 pg (ref 26.0–34.0)
MCH: 30.8 pg (ref 26.0–34.0)
MCHC: 33 g/dL (ref 30.0–36.0)
MCHC: 34.2 g/dL (ref 30.0–36.0)
MCV: 90 fL (ref 80.0–100.0)
MCV: 91.5 fL (ref 80.0–100.0)
Monocytes Absolute: 1.1 10*3/uL — ABNORMAL HIGH (ref 0.1–1.0)
Monocytes Absolute: 1.7 10*3/uL — ABNORMAL HIGH (ref 0.1–1.0)
Monocytes Relative: 7 %
Monocytes Relative: 9 %
Neutro Abs: 10.1 10*3/uL — ABNORMAL HIGH (ref 1.7–7.7)
Neutro Abs: 16.2 10*3/uL — ABNORMAL HIGH (ref 1.7–7.7)
Neutrophils Relative %: 60 %
Neutrophils Relative %: 80 %
Platelets: 206 10*3/uL (ref 150–400)
Platelets: 210 10*3/uL (ref 150–400)
RBC: 1.99 MIL/uL — ABNORMAL LOW (ref 3.87–5.11)
RBC: 2.21 MIL/uL — ABNORMAL LOW (ref 3.87–5.11)
RDW: 17 % — ABNORMAL HIGH (ref 11.5–15.5)
RDW: 18.4 % — ABNORMAL HIGH (ref 11.5–15.5)
WBC: 16.5 10*3/uL — ABNORMAL HIGH (ref 4.0–10.5)
WBC: 19.8 10*3/uL — ABNORMAL HIGH (ref 4.0–10.5)
nRBC: 0.2 % (ref 0.0–0.2)
nRBC: 0.3 % — ABNORMAL HIGH (ref 0.0–0.2)

## 2021-04-02 LAB — BASIC METABOLIC PANEL
Anion gap: 5 (ref 5–15)
BUN: 14 mg/dL (ref 6–20)
CO2: 23 mmol/L (ref 22–32)
Calcium: 8.3 mg/dL — ABNORMAL LOW (ref 8.9–10.3)
Chloride: 113 mmol/L — ABNORMAL HIGH (ref 98–111)
Creatinine, Ser: 0.59 mg/dL (ref 0.44–1.00)
GFR, Estimated: >60 mL/min (ref >60)
Glucose, Bld: 111 mg/dL — ABNORMAL HIGH (ref 70–99)
Potassium: 4.1 mmol/L (ref 3.5–5.1)
Sodium: 141 mmol/L (ref 135–145)

## 2021-04-02 LAB — CULTURE, BLOOD (ROUTINE X 2)
Culture: NO GROWTH
Culture: NO GROWTH
Special Requests: ADEQUATE

## 2021-04-02 LAB — PROTIME-INR
INR: 1.4 — ABNORMAL HIGH (ref 0.8–1.2)
Prothrombin Time: 17.1 seconds — ABNORMAL HIGH (ref 11.4–15.2)

## 2021-04-02 LAB — PREPARE RBC (CROSSMATCH)

## 2021-04-02 MED ORDER — LIDOCAINE HCL 1 % IJ SOLN
20.0000 mL | INTRAMUSCULAR | Status: AC
  Filled 2021-04-02: qty 20

## 2021-04-02 MED ORDER — SODIUM CHLORIDE 0.45 % IV BOLUS
1000.0000 mL | Freq: Once | INTRAVENOUS | Status: AC
  Administered 2021-04-02: 1000 mL via INTRAVENOUS

## 2021-04-02 MED ORDER — OXYCODONE HCL 5 MG PO TABS
10.0000 mg | ORAL_TABLET | ORAL | Status: DC
  Administered 2021-04-02 – 2021-04-11 (×45): 10 mg via ORAL
  Filled 2021-04-02 (×45): qty 2

## 2021-04-02 MED ORDER — SODIUM CHLORIDE 0.9% IV SOLUTION: Freq: Once | INTRAVENOUS | Status: AC

## 2021-04-02 MED ORDER — ACETAMINOPHEN 325 MG PO TABS
650.0000 mg | ORAL_TABLET | Freq: Once | ORAL | Status: AC
  Administered 2021-04-02: 650 mg via ORAL
  Filled 2021-04-02: qty 2

## 2021-04-02 MED ORDER — HYDROMORPHONE 1 MG/ML IV SOLN
INTRAVENOUS | Status: DC
  Administered 2021-04-02: 2.4 mg via INTRAVENOUS
  Administered 2021-04-02: 1 mg via INTRAVENOUS
  Administered 2021-04-02: 0.5 mg via INTRAVENOUS
  Administered 2021-04-02: 5 mg via INTRAVENOUS
  Administered 2021-04-02 – 2021-04-03 (×2): 30 mg via INTRAVENOUS
  Administered 2021-04-03: 2.3 mg via INTRAVENOUS
  Administered 2021-04-03: 5.6 mg via INTRAVENOUS
  Administered 2021-04-03: 3 mg via INTRAVENOUS
  Administered 2021-04-03: 4.5 mg via INTRAVENOUS
  Administered 2021-04-03: 1.5 mg via INTRAVENOUS
  Administered 2021-04-04 (×2): 3.5 mg via INTRAVENOUS
  Administered 2021-04-04: 4.5 mg via INTRAVENOUS
  Administered 2021-04-04: 2.5 mg via INTRAVENOUS
  Administered 2021-04-04: 3 mg via INTRAVENOUS
  Administered 2021-04-05: 30 mg via INTRAVENOUS
  Administered 2021-04-05: 2.5 mg via INTRAVENOUS
  Administered 2021-04-05: 3 mg via INTRAVENOUS
  Administered 2021-04-05: 2 mg via INTRAVENOUS
  Administered 2021-04-05: 2.5 mg via INTRAVENOUS
  Administered 2021-04-05: 4.5 mg via INTRAVENOUS
  Administered 2021-04-05 – 2021-04-06 (×2): 2 mg via INTRAVENOUS
  Administered 2021-04-06: 30 mg via INTRAVENOUS
  Administered 2021-04-06: 4.5 mg via INTRAVENOUS
  Administered 2021-04-06 (×2): 5 mg via INTRAVENOUS
  Administered 2021-04-06: 3 mg via INTRAVENOUS
  Administered 2021-04-06: 6 mg via INTRAVENOUS
  Administered 2021-04-07: 30 mg via INTRAVENOUS
  Administered 2021-04-07: 6 mg via INTRAVENOUS
  Administered 2021-04-07: 5 mg via INTRAVENOUS
  Administered 2021-04-07: 3 mg via INTRAVENOUS
  Administered 2021-04-07: 2 mg via INTRAVENOUS
  Administered 2021-04-07: 4 mg via INTRAVENOUS
  Administered 2021-04-08: 30 mg via INTRAVENOUS
  Administered 2021-04-08: 5.5 mg via INTRAVENOUS
  Administered 2021-04-08: 5 mg via INTRAVENOUS
  Administered 2021-04-08: 3 mg via INTRAVENOUS
  Administered 2021-04-08: 4.5 mg via INTRAVENOUS
  Administered 2021-04-08: 3.5 mg via INTRAVENOUS
  Administered 2021-04-08: 4.5 mg via INTRAVENOUS
  Administered 2021-04-09: 30 mg via INTRAVENOUS
  Administered 2021-04-09: 3.5 mg via INTRAVENOUS
  Administered 2021-04-09: 6.5 mg via INTRAVENOUS
  Administered 2021-04-09: 1.5 mg via INTRAVENOUS
  Administered 2021-04-09: 3 mg via INTRAVENOUS
  Administered 2021-04-09: 4 mg via INTRAVENOUS
  Administered 2021-04-10: 4.5 mg via INTRAVENOUS
  Administered 2021-04-10: 3.5 mg via INTRAVENOUS
  Administered 2021-04-10: 2.5 mg via INTRAVENOUS
  Administered 2021-04-10: 5.29 mg via INTRAVENOUS
  Administered 2021-04-10: 3.5 mg via INTRAVENOUS
  Administered 2021-04-10: 2.5 mg via INTRAVENOUS
  Administered 2021-04-11: 2 mg via INTRAVENOUS
  Administered 2021-04-11: 0.5 mg via INTRAVENOUS
  Administered 2021-04-11: 0 mg via INTRAVENOUS
  Filled 2021-04-02 (×7): qty 30

## 2021-04-02 MED ORDER — DIPHENHYDRAMINE HCL 50 MG/ML IJ SOLN
25.0000 mg | Freq: Once | INTRAMUSCULAR | Status: AC
  Administered 2021-04-02: 25 mg via INTRAVENOUS
  Filled 2021-04-02: qty 1

## 2021-04-02 MED ORDER — GABAPENTIN 300 MG PO CAPS
300.0000 mg | ORAL_CAPSULE | Freq: Three times a day (TID) | ORAL | Status: DC
  Administered 2021-04-02 – 2021-04-11 (×28): 300 mg via ORAL
  Filled 2021-04-02 (×28): qty 1

## 2021-04-02 NOTE — Significant Event (Addendum)
Rapid Response Event Note   Reason for Call :  Profuse pulsatile bleeding from wound on left ankle.   Initial Focused Assessment:  Patient is alert, oriented, and anxious. Staff have been holding pressure on wound for a least fifteen minutes prior to rapid nurse arrival. This has occurred previously with dressing change but had not continued bleeding. Unable to visualize wound immediately as multiple layers of linens, towels, gauze applied to aide in clotting. Patient's blood pressure is slightly hypotensive and HR elevated. Patient is on PCA for sickle cell pain and wound pain. Dr. Hal Hope has been called and came to bedside.  Interventions:  Monitored vitals and encouraged patient to stay calm. Dr. Hal Hope ordered one liter 0.45 NS bolus and called vascular surgeon Dr. Donzetta Matters to come to bedside to assess wound and bleeding. Bolus given. Dr. Donzetta Matters was able to visualize bleeding and stitch bleeding artery. Nurses at bedside provided assistance. See Dr. Claretha Cooper note. Phlebotomist drew blood as ordered for coagulation studies, blood administration, and CBC.   Plan of Care:  Continue monitoring blood pressure and maintain IV fluids. IV team to place new IV site as current site may be infiltrating. Patient c/o discomfort with IV bolus. Patient's blood pressures sustaining with map around 70's. Will remain on current unit for now. Also added patient on telemetry and blood to be given by bedside RN. IV team present to place new IV site.  Event Summary:   MD Notified: 2330 Call Time: [place] Time: 2330 End Time: [place], RN

## 2021-04-02 NOTE — Progress Notes (Addendum)
Upon changing the patient's saturated left lower leg dressing last night, her leg began to bleed profusely. A thrombus pad was applied to patient's leg, but was not helpful. I continued to apply pressure non-stop and notified Rapid Response, provider on call and AC.  Patient received labs, a bolus, and the vascular surgeon was called in. I maintained pressure on site for about 1 hour prior to Dr. Claretha Cooper arrival. Dr Donzetta Matters was able to stitch patient and stop her bleeding. Patient is now receiving a unit of blood for a low HGB of 6.0. Appeared to have lost a unit or so of blood per physician. Will continue to monitor patient.Roderick Pee

## 2021-04-02 NOTE — Consult Note (Signed)
Hospital Consult    Reason for Consult: Bleeding left ankle wound Referring Physician: Dr. Hal Hope MRN #:  [id number]  History of Present Illness: This is a 49 y.o. female history of sickle cell crisis.  She is admitted with bilateral lower extremity wounds.  Late last night she began bleeding from her left medial ankle that was noted to be pulsatile.  Her blood pressure did decrease and her heart rate increased.  She was given a fluid bolus.  I was consulted for pulsatile bleeding from the left ankle.  Patient was not given blood thinner she does not take blood thinners at home.  Past Medical History:  Diagnosis Date   Anemia 03/30/2012   Hx of sickle cell disease   CAP (community acquired pneumonia)    2014   Cholelithiasis 03/30/2012   Chronic wound of extremity 04/01/2012   Elevated LFTs    Leg ulcer ([place]) 10/27/2012   Chronic under care of wound clinic   Multiple open wounds of lower extremity    chronic wounds B/LLE   Sickle cell disease ([place])    Sinus bradycardia by electrocardiogram 04/03/2012    Past Surgical History:  Procedure Laterality Date   ALLOGRAFT APPLICATION Right 2/77/8242   Procedure: SURGICAL PREP FOR GRAFTING RIGHT LOWER EXTREMITY AND APPLICATION OF Jannifer Hick;  Surgeon: Irene Limbo, MD;  Location: [facility];  Service: Plastics;  Laterality: Right;   APPLICATION OF A-CELL OF EXTREMITY Right 10/09/2015   Procedure: APPLICATION OF Jannifer Hick;  Surgeon: Irene Limbo, MD;  Location: [facility];  Service: Plastics;  Laterality: Right;   BREAST SURGERY     left breast cyst aspiration   CESAREAN SECTION     CESAREAN SECTION N/A 01/06/2014   Procedure: CESAREAN SECTION;  Surgeon: Emily Filbert, MD;  Location: [facility];  Service: Obstetrics;  Laterality: N/A;   CESAREAN SECTION N/A 05/04/2017   Procedure: CESAREAN SECTION;  Surgeon: Woodroe Mode, MD;  Location: [facility];  Service: Obstetrics;  Laterality: N/A;   I &  D EXTREMITY Right 10/09/2015   Procedure: SURGICAL PREPARATION FOR GRAFTING RIGHT ANKLE AND APPLICATION THERASKIN;  Surgeon: Irene Limbo, MD;  Location: [facility];  Service: Plastics;  Laterality: Right;   SKIN FULL THICKNESS GRAFT Bilateral 06/17/2016   Procedure: SURGICAL PREP FOR GRAFTING, BILATERAL LOWER EXTREMITIES AND APPLICATION OF Jannifer Hick;  Surgeon: Irene Limbo, MD;  Location: [facility];  Service: Plastics;  Laterality: Bilateral;   TONSILLECTOMY      No Known Allergies  Prior to Admission medications   Medication Sig Start Date End Date Taking? Authorizing Provider  folic acid (FOLVITE) 1 MG tablet Take 5 tablets (5 mg total) by mouth daily. Patient taking differently: Take 1 mg by mouth daily. 01/15/17  Yes Anyanwu, Sallyanne Havers, MD  ibuprofen (ADVIL) 600 MG tablet TAKE 1 TABLET(600 MG) BY MOUTH EVERY 6 HOURS Patient taking differently: Take 600 mg by mouth every 6 (six) hours as needed for mild pain. 03/23/21  Yes [name], FNP  Oxycodone HCl 10 MG TABS Take 1 tablet (10 mg total) by mouth every 4 (four) hours as needed. Patient taking differently: Take 10 mg by mouth every 4 (four) hours as needed (pain). 03/13/21  Yes [name], FNP  predniSONE (DELTASONE) 5 MG tablet Take 5 mg by mouth daily with breakfast.   Yes [provider]  Amino Acids-Protein Hydrolys (FEEDING SUPPLEMENT, PRO-STAT 64,) LIQD Take 30 mLs by mouth 3 (three) times daily with meals.  Patient not taking: No sig reported 03/02/19   [name], FNP  Deferasirox (JADENU) 360 MG TABS Take 1 tablet (360 mg total) by mouth daily. Patient not taking: No sig reported 07/21/19   [name], FNP    Social History   Socioeconomic History   Marital status: Married    Spouse name: [name]   Number of children: 1   Years of education: Not on file   Highest education level: Not on file  Occupational History   Occupation: Employed in home care.  Tobacco  Use   Smoking status: Never   Smokeless tobacco: Never  Vaping Use   Vaping Use: Never used  Substance and Sexual Activity   Alcohol use: No   Drug use: No   Sexual activity: Yes    Birth control/protection: None  Other Topics Concern   Not on file  Social History Narrative   Lives with husband.   Social Determinants of Health   Financial Resource Strain: Not on file  Food Insecurity: Not on file  Transportation Needs: Not on file  Physical Activity: Not on file  Stress: Not on file  Social Connections: Not on file  Intimate Partner Violence: Not on file     Family History  Problem Relation Age of Onset   Sickle cell anemia Sister    Diabetes Mother    Asthma Mother    Sickle cell trait Mother    Sickle cell trait Father    Hypertension Father     ROS: Not obtained due to urgent nature of consultation   Physical Examination  Vitals:   04/01/21 2039 04/01/21 2218  BP:  128/73  Pulse:  (!) 57  Resp: 17 14  Temp:  98.1 F (36.7 C)  SpO2: 100% 98%   Body mass index is 19.3 kg/m.  Awake alert and oriented x3 Nonlabored respirations There is a 1+ palpable dorsalis pedis pulse on the left The right ankle has a dressing in place with [place] There is a large necrotic area on the medial left ankle with what appears to be exposed Achilles tendon.  The tibia is very superficial in the wound although not directly visualized.  There is pulsatile bleeding from what appears to be possibly a sidebranch of the posterior tibial artery in a retrograde fashion.  There is no antegrade pulsatility identified.  CBC    Component Value Date/Time   WBC 8.6 04/01/2021 0606   RBC 2.59 (L) 04/01/2021 0606   HGB 7.9 (L) 04/01/2021 0606   HGB 5.7 (LL) 11/08/2020 0948   HGB 8.7 (L) 01/20/2014 0757   HCT 23.5 (L) 04/01/2021 0606   HCT 16.0 (LL) 11/08/2020 0948   HCT 25.2 (L) 01/20/2014 0757   PLT 330 04/01/2021 0606   PLT 464 (H) 11/08/2020 0948   MCV 90.7 04/01/2021 0606   MCV  88 11/08/2020 0948   MCV 84.3 01/20/2014 0757   MCH 30.5 04/01/2021 0606   MCHC 33.6 04/01/2021 0606   RDW 18.1 (H) 04/01/2021 0606   RDW 21.4 (H) 11/08/2020 0948   RDW 19.3 (H) 12/20/2016 0924   RDW 20.0 (H) 01/20/2014 0757   LYMPHSABS 1.9 03/30/2021 0511   LYMPHSABS 2.3 11/08/2020 0948   LYMPHSABS 2.7 01/20/2014 0757   MONOABS 1.9 (H) 03/30/2021 0511   MONOABS 1.9 (H) 01/20/2014 0757   EOSABS 1.0 (H) 03/30/2021 0511   EOSABS 0.5 (H) 11/08/2020 0948   BASOSABS 0.1 03/30/2021 0511   BASOSABS 0.1 11/08/2020 6295  BASOSABS 0.2 (H) 01/20/2014 0757    BMET    Component Value Date/Time   NA 138 03/29/2021 0616   NA 139 11/08/2020 0948   NA 139 01/20/2014 0758   K 4.2 03/29/2021 0616   K 3.9 01/20/2014 0758   CL 109 03/29/2021 0616   CO2 24 03/29/2021 0616   CO2 23 01/20/2014 0758   GLUCOSE 85 03/29/2021 0616   GLUCOSE 72 01/20/2014 0758   BUN 5 (L) 03/29/2021 0616   BUN 5 (L) 11/08/2020 0948   BUN 6.0 (L) 01/20/2014 0758   CREATININE 0.40 (L) 03/31/2021 0655   CREATININE 0.30 (L) 12/20/2016 0925   CREATININE 0.5 (L) 01/20/2014 0758   CALCIUM 8.3 (L) 03/29/2021 0616   CALCIUM 9.0 01/20/2014 0758   GFRNONAA >60 03/31/2021 0655   GFRNONAA >89 12/20/2016 0925   GFRAA 139 11/18/2018 0923   GFRAA >89 12/20/2016 0925    COAGS: Lab Results  Component Value Date   INR 1.2 03/28/2021   INR 1.15 09/09/2016     Non-Invasive Vascular Imaging:   No vascular studies  ASSESSMENT/PLAN: This is a 50 y.o. female with pulsatile bleeding from a left medial ankle wound.  Appears to be retrograde in nature possibly a sidebranch of the posterior tibial artery but impossible to definitively tell given the patient's significant tenderness to even gentle touch.  I was able to get decent hemostasis with direct pressure for about 10 minutes on the pulsatile area.  I then used 2% Xylocaine and numb the area surrounding the artery this fully controlled the bleeding.  I then placed an  interrupted 5-0 Prolene stitch in the area and at completion there was no further bleeding.  Quick clot was placed.  Xeroform gauze was placed on 2 wounds that are lateral on the ankle and foot.  The area was then cleaned up and wrapped with Kerlix and Ace bandage.  She tolerated all of this well and at completion she was tachycardic to approximately 446 with a systolic blood pressure right at 90 but she was mentating well and remained oriented throughout the time.  Pain was controlled with Dilaudid PCA.  Telesia Ates C. Donzetta Matters, MD [facility] Office: [phone] Pager: [phone]

## 2021-04-02 NOTE — Progress Notes (Signed)
Subjective: [name] is a 50 year old female with a medical history significant for sickle cell disease, chronic pain syndrome, opiate dependence and tolerance, and bilateral lower extremity wounds that was admitted for bilateral wound infection in the setting of sickle cell pain crisis.  Late last night, patient began bleeding profusely from her left medial ankle that was noted to be pulsatile.  At that time, patient was given a fluid bolus and pain and vascular specialist was consulted for bleeding from the left ankle.  It was found that the nature of bleeding was a 5 branch of the posterior tibial artery, her surgeon it was definitively impossible to tell given the patient's significant pain to that area.  Surgeon was able to gain hemostasis. Patient is not having bleeding at this time.  She is having a significant amount of pain to left lower extremity characterized as constant and shooting.  Pain intensity is 8/10.   She denies dizziness, headache, chest pain, urinary symptoms, nausea, vomiting, or diarrhea.  Objective:  Vital signs in last 24 hours:  Vitals:   04/02/21 0209 04/02/21 0428 04/02/21 0437 04/02/21 0807  BP: 102/65 99/67    Pulse: 87 96    Resp: 14 16 14 16   Temp: 98 F (36.7 C) 98.3 F (36.8 C)    TempSrc: Oral Oral    SpO2: 94% 100% 100% 100%  Weight:      Height:        Intake/Output from previous day:   Intake/Output Summary (Last 24 hours) at 04/02/2021 0919 Last data filed at 04/02/2021 0801 Gross per 24 hour  Intake 2704.39 ml  Output 1650 ml  Net 1054.39 ml    Physical Exam: General: Alert, awake, oriented x3, in no acute distress.  HEENT: [place]/AT PEERL, EOMI Neck: Trachea midline,  no masses, no thyromegal,y no JVD, no carotid bruit OROPHARYNX:  Moist, No exudate/ erythema/lesions.  Heart: Regular rate and rhythm, without murmurs, rubs, gallops, PMI non-displaced, no heaves or thrills on palpation.  Lungs: Clear to auscultation, no wheezing or  rhonchi noted. No increased vocal fremitus resonant to percussion  Abdomen: Soft, nontender, nondistended, positive bowel sounds, no masses no hepatosplenomegaly noted..  Neuro: No focal neurological deficits noted cranial nerves II through XII grossly intact. DTRs 2+ bilaterally upper and lower extremities. Strength 5 out of 5 in bilateral upper and lower extremities. Musculoskeletal: No warm swelling or erythema around joints, no spinal tenderness noted. Psychiatric: Patient alert and oriented x3, good insight and cognition, good recent to remote recall. Lymph node survey: No cervical axillary or inguinal lymphadenopathy noted.  Lab Results:  Basic Metabolic Panel:    Component Value Date/Time   NA 141 04/02/2021 0642   NA 139 11/08/2020 0948   NA 139 01/20/2014 0758   K 4.1 04/02/2021 0642   K 3.9 01/20/2014 0758   CL 113 (H) 04/02/2021 0642   CO2 23 04/02/2021 0642   CO2 23 01/20/2014 0758   BUN 14 04/02/2021 0642   BUN 5 (L) 11/08/2020 0948   BUN 6.0 (L) 01/20/2014 0758   CREATININE 0.59 04/02/2021 0642   CREATININE 0.30 (L) 12/20/2016 0925   CREATININE 0.5 (L) 01/20/2014 0758   GLUCOSE 111 (H) 04/02/2021 0642   GLUCOSE 72 01/20/2014 0758   CALCIUM 8.3 (L) 04/02/2021 0642   CALCIUM 9.0 01/20/2014 0758   CBC:    Component Value Date/Time   WBC 19.8 (H) 04/02/2021 0642   HGB 6.8 (LL) 04/02/2021 0642   HGB 5.7 (LL) 11/08/2020 4656  HGB 8.7 (L) 01/20/2014 0757   HCT 19.9 (L) 04/02/2021 0642   HCT 16.0 (LL) 11/08/2020 0948   HCT 25.2 (L) 01/20/2014 0757   PLT 206 04/02/2021 0642   PLT 464 (H) 11/08/2020 0948   MCV 90.0 04/02/2021 0642   MCV 88 11/08/2020 0948   MCV 84.3 01/20/2014 0757   NEUTROABS 16.2 (H) 04/02/2021 0642   NEUTROABS 9.3 (H) 11/08/2020 0948   NEUTROABS 8.7 (H) 01/20/2014 0757   LYMPHSABS 1.5 04/02/2021 0642   LYMPHSABS 2.3 11/08/2020 0948   LYMPHSABS 2.7 01/20/2014 0757   MONOABS 1.7 (H) 04/02/2021 0642   MONOABS 1.9 (H) 01/20/2014 0757   EOSABS  0.2 04/02/2021 0642   EOSABS 0.5 (H) 11/08/2020 0948   BASOSABS 0.1 04/02/2021 0642   BASOSABS 0.1 11/08/2020 0948   BASOSABS 0.2 (H) 01/20/2014 0757    Recent Results (from the past 240 hour(s))  Blood culture (routine x 2)     Status: None   Collection Time: 03/28/21 12:14 AM   Specimen: BLOOD RIGHT FOREARM  Result Value Ref Range Status   Specimen Description BLOOD RIGHT FOREARM  Final   Special Requests   Final    BOTTLES DRAWN AEROBIC AND ANAEROBIC Blood Culture adequate volume   Culture   Final    NO GROWTH 5 DAYS Performed at [facility], [address], [place], [place] [address]    Report Status 04/02/2021 FINAL  Final  Blood culture (routine x 2)     Status: None   Collection Time: 03/28/21 12:14 AM   Specimen: BLOOD LEFT FOREARM  Result Value Ref Range Status   Specimen Description BLOOD LEFT FOREARM  Final   Special Requests   Final    BOTTLES DRAWN AEROBIC AND ANAEROBIC Blood Culture results may not be optimal due to an excessive volume of blood received in culture bottles   Culture   Final    NO GROWTH 5 DAYS Performed at [facility], [place] [address], [place], [place] [address]    Report Status 04/02/2021 FINAL  Final  Resp Panel by RT-PCR (Flu A&B, Covid) Nasopharyngeal Swab     Status: None   Collection Time: 03/28/21 12:22 AM   Specimen: Nasopharyngeal Swab; Nasopharyngeal(NP) swabs in vial transport medium  Result Value Ref Range Status   SARS Coronavirus 2 by RT PCR NEGATIVE NEGATIVE Final    Comment: (NOTE) SARS-CoV-2 target nucleic acids are NOT DETECTED.  The SARS-CoV-2 RNA is generally detectable in upper respiratory specimens during the acute phase of infection. The lowest concentration of SARS-CoV-2 viral copies this assay can detect is 138 copies/mL. A negative result does not preclude SARS-Cov-2 infection and should not be used as the sole basis for treatment or other patient management decisions. A negative result may occur with   improper specimen collection/handling, submission of specimen other than nasopharyngeal swab, presence of viral mutation(s) within the areas targeted by this assay, and inadequate number of viral copies(<138 copies/mL). A negative result must be combined with clinical observations, patient history, and epidemiological information. The expected result is Negative.  Fact Sheet for Patients:  EntrepreneurPulse.com.au  Fact Sheet for Healthcare Providers:  IncredibleEmployment.be  This test is no t yet approved or cleared by the [place] FDA and  has been authorized for detection and/or diagnosis of SARS-CoV-2 by FDA under an Emergency Use Authorization (EUA). This EUA will remain  in effect (meaning this test can be used) for the duration of the COVID-19 declaration under Section 564(b)(1)  of the Act, 21 U.S.C.section 360bbb-3(b)(1), unless the authorization is terminated  or revoked sooner.       Influenza A by PCR NEGATIVE NEGATIVE Final   Influenza B by PCR NEGATIVE NEGATIVE Final    Comment: (NOTE) The Xpert Xpress SARS-CoV-2/FLU/RSV plus assay is intended as an aid in the diagnosis of influenza from Nasopharyngeal swab specimens and should not be used as a sole basis for treatment. Nasal washings and aspirates are unacceptable for Xpert Xpress SARS-CoV-2/FLU/RSV testing.  Fact Sheet for Patients: EntrepreneurPulse.com.au  Fact Sheet for Healthcare Providers: IncredibleEmployment.be  This test is not yet approved or cleared by the [place] FDA and has been authorized for detection and/or diagnosis of SARS-CoV-2 by FDA under an Emergency Use Authorization (EUA). This EUA will remain in effect (meaning this test can be used) for the duration of the COVID-19 declaration under Section 564(b)(1) of the Act, 21 U.S.C. section 360bbb-3(b)(1), unless the authorization is terminated  or revoked.  Performed at [facility], [place] [address], [place], [place] [address]   Aerobic Culture w Gram Stain (superficial specimen)     Status: None   Collection Time: 03/28/21  7:02 PM   Specimen: Foot  Result Value Ref Range Status   Specimen Description   Final    FOOT RIGHT Performed at [facility] [place] [address], [place], [place] [address]    Special Requests   Final    Immunocompromised Performed at [facility], [place] [address] [place], [place] [address]    Gram Stain   Final    RARE WBC PRESENT, PREDOMINANTLY PMN RARE GRAM POSITIVE COCCI IN PAIRS Performed at [facility], [place] [address], [place], [place] [address]    Culture RARE PSEUDOMONAS AERUGINOSA  Final   Report Status 04/01/2021 FINAL  Final   Organism ID, Bacteria PSEUDOMONAS AERUGINOSA  Final      Susceptibility   Pseudomonas aeruginosa - MIC*    CEFTAZIDIME 4 SENSITIVE Sensitive     CIPROFLOXACIN <=0.25 SENSITIVE Sensitive     GENTAMICIN <=1 SENSITIVE Sensitive     IMIPENEM 1 SENSITIVE Sensitive     PIP/TAZO 16 SENSITIVE Sensitive     CEFEPIME 2 SENSITIVE Sensitive     * RARE PSEUDOMONAS AERUGINOSA  Aerobic Culture w Gram Stain (superficial specimen)     Status: Abnormal   Collection Time: 03/28/21  7:02 PM   Specimen: Foot  Result Value Ref Range Status   Specimen Description   Final    FOOT LEFT Performed at [facility], [place] [address], [place], [place] [address]    Special Requests   Final    NONE Performed at [place], [place] [address] [place], [place] [address]    Gram Stain   Final    MODERATE WBC PRESENT,BOTH PMN AND MONONUCLEAR FEW GRAM NEGATIVE RODS FEW GRAM POSITIVE COCCI IN PAIRS FEW GRAM NEGATIVE COCCI IN PAIRS    Culture (A)  Final    MULTIPLE ORGANISMS PRESENT, NONE PREDOMINANT NO GROUP A STREP (S.PYOGENES) ISOLATED NO STAPHYLOCOCCUS AUREUS ISOLATED Performed at [facility], [place] [address] [place], [place] [address]    Report Status 03/31/2021 FINAL  Final    Studies/Results: No results found.  Medications: Scheduled Meds:  sodium chloride   Intravenous Once   acetaminophen  650 mg Oral Once   diphenhydrAMINE  25 mg Intravenous Once   HYDROmorphone   Intravenous Q4H   lidocaine  20  mL Other STAT   oxyCODONE  10 mg Oral Q4H while awake   senna-docusate  1 tablet Oral BID   Continuous Infusions:  ceFEPime (MAXIPIME) IV 2 g (04/02/21 0111)   diphenhydrAMINE     metronidazole 500 mg (04/02/21 0447)   PRN Meds:.acetaminophen, diphenhydrAMINE **OR** diphenhydrAMINE, naloxone **AND** sodium chloride flush, ondansetron (ZOFRAN) IV, polyethylene glycol  Consultants: Vein and vascular specialist  Antibiotics: IV cefepime IV metronidazole  Assessment/Plan: Principal Problem:   Sickle cell pain crisis ([place]) Active Problems:   Complicated wound infection   Low hemoglobin   Bilateral leg ulcer (HCC)  Bilateral leg ulcers: Vein and vascular consult.  Due to profuse left ankle bleeding.  Hemostasis obtained.  Will defer to specialists for follow-up, appreciate input. Continue other supportive care and wound dressing  Sickle cell disease with pain crisis: Continue IV Dilaudid PCA Gabapentin 300 mg every 8 hours IV Toradol 15 mg every 6 hours for total of 5 days Continue home medications as ordered Monitor vital signs closely, reevaluate pain scale regularly, and supplemental oxygen as needed.  Anemia of chronic disease: Today, patient's hemoglobin is 6.8 g/dL.  Patient is status post 1 unit PRBCs earlier today.  Hemoglobin increased slightly following transfusing, will transfuse an additional unit of PRBCs today.  Follow labs in AM. Maintain hemoglobin above 7.0 g/dL.  Chronic pain syndrome: Continue home medications    Code Status: Full Code Family Communication: N/A Disposition Plan: Not yet ready for discharge  [place], MSN, FNP-C Patient [place] [address] [place], [place] [address] [phone]  If 5PM-8AM, please contact night-coverage.  04/02/2021, 9:19 AM  LOS: 5 days

## 2021-04-03 DIAGNOSIS — D5700 Hb-SS disease with crisis, unspecified: Secondary | ICD-10-CM | POA: Diagnosis not present

## 2021-04-03 LAB — BPAM RBC
Blood Product Expiration Date: 202212012359
Blood Product Expiration Date: 202212092359
ISSUE DATE / TIME: 202211140148
ISSUE DATE / TIME: 202211141607
Unit Type and Rh: 9500
Unit Type and Rh: 9500

## 2021-04-03 LAB — TYPE AND SCREEN
ABO/RH(D): B POS
Antibody Screen: NEGATIVE
Unit division: 0
Unit division: 0

## 2021-04-03 LAB — CBC WITH DIFFERENTIAL/PLATELET
Abs Immature Granulocytes: 0.07 10*3/uL (ref 0.00–0.07)
Basophils Absolute: 0.2 10*3/uL — ABNORMAL HIGH (ref 0.0–0.1)
Basophils Relative: 1 %
Eosinophils Absolute: 1 10*3/uL — ABNORMAL HIGH (ref 0.0–0.5)
Eosinophils Relative: 7 %
HCT: 20.7 % — ABNORMAL LOW (ref 36.0–46.0)
Hemoglobin: 7.1 g/dL — ABNORMAL LOW (ref 12.0–15.0)
Immature Granulocytes: 1 %
Lymphocytes Relative: 19 %
Lymphs Abs: 2.5 10*3/uL (ref 0.7–4.0)
MCH: 30.7 pg (ref 26.0–34.0)
MCHC: 34.3 g/dL (ref 30.0–36.0)
MCV: 89.6 fL (ref 80.0–100.0)
Monocytes Absolute: 2 10*3/uL — ABNORMAL HIGH (ref 0.1–1.0)
Monocytes Relative: 15 %
Neutro Abs: 7.7 10*3/uL (ref 1.7–7.7)
Neutrophils Relative %: 57 %
Platelets: 217 10*3/uL (ref 150–400)
RBC: 2.31 MIL/uL — ABNORMAL LOW (ref 3.87–5.11)
RDW: 17.5 % — ABNORMAL HIGH (ref 11.5–15.5)
WBC: 13.3 10*3/uL — ABNORMAL HIGH (ref 4.0–10.5)
nRBC: 0.2 % (ref 0.0–0.2)

## 2021-04-03 LAB — COMPREHENSIVE METABOLIC PANEL
ALT: 14 U/L (ref 0–44)
AST: 34 U/L (ref 15–41)
Albumin: 2.8 g/dL — ABNORMAL LOW (ref 3.5–5.0)
Alkaline Phosphatase: 34 U/L — ABNORMAL LOW (ref 38–126)
Anion gap: 4 — ABNORMAL LOW (ref 5–15)
BUN: 9 mg/dL (ref 6–20)
CO2: 24 mmol/L (ref 22–32)
Calcium: 8.1 mg/dL — ABNORMAL LOW (ref 8.9–10.3)
Chloride: 112 mmol/L — ABNORMAL HIGH (ref 98–111)
Creatinine, Ser: 0.36 mg/dL — ABNORMAL LOW (ref 0.44–1.00)
GFR, Estimated: >60 mL/min (ref >60)
Glucose, Bld: 91 mg/dL (ref 70–99)
Potassium: 3.4 mmol/L — ABNORMAL LOW (ref 3.5–5.1)
Sodium: 140 mmol/L (ref 135–145)
Total Bilirubin: 1.4 mg/dL — ABNORMAL HIGH (ref 0.3–1.2)
Total Protein: 5.5 g/dL — ABNORMAL LOW (ref 6.5–8.1)

## 2021-04-03 MED ORDER — CIPROFLOXACIN HCL 500 MG PO TABS
500.0000 mg | ORAL_TABLET | Freq: Two times a day (BID) | ORAL | Status: AC
  Administered 2021-04-03 – 2021-04-09 (×13): 500 mg via ORAL
  Filled 2021-04-03 (×13): qty 1

## 2021-04-03 NOTE — Progress Notes (Addendum)
Subjective: [name] is a 50 year old female with a medical history significant for sickle cell disease, chronic pain syndrome, opiate dependence and tolerance, and bilateral lower extremity wounds that was admitted for bilateral wound infection in the setting of sickle cell pain crisis.  Previously patient began bleeding profusely from her left medial ankle that was noted to be pulsatile.  At that time, patient was given a fluid bolus and pain and vascular specialist was consulted for bleeding from the left ankle.  It was found that the nature of bleeding was a 5 branch of the posterior tibial artery, her surgeon it was definitively impossible to tell given the patient's significant pain to that area.  Surgeon was able to gain hemostasis. Patient is not having bleeding at this time.  She is having a significant amount of pain to left lower extremity characterized as constant and shooting.  Patient has not had any bleeding overnight. She continues to have significant lower extremity drainage.   Pain intensity improved overnight. Patient rates pain as 5/10. She is unable to apply pressure to lower extremities.   She denies dizziness, headache, chest pain, urinary symptoms, nausea, vomiting, or diarrhea.  Objective:  Vital signs in last 24 hours:  Vitals:   04/03/21 0302 04/03/21 0606 04/03/21 0756 04/03/21 1030  BP:  110/64  122/65  Pulse:  (!) 59  66  Resp: 14 14 16 15   Temp:  98.1 F (36.7 C)  98.6 F (37 C)  TempSrc:  Oral  Oral  SpO2: 100% 100% 100% 100%  Weight:      Height:        Intake/Output from previous day:   Intake/Output Summary (Last 24 hours) at 04/03/2021 1732 Last data filed at 04/03/2021 0407 Gross per 24 hour  Intake 1282.35 ml  Output 300 ml  Net 982.35 ml    Physical Exam: General: Alert, awake, oriented x3, in no acute distress.  HEENT: [place]/AT PEERL, EOMI Neck: Trachea midline,  no masses, no thyromegal,y no JVD, no carotid bruit OROPHARYNX:   Moist, No exudate/ erythema/lesions.  Heart: Regular rate and rhythm, without murmurs, rubs, gallops, PMI non-displaced, no heaves or thrills on palpation.  Lungs: Clear to auscultation, no wheezing or rhonchi noted. No increased vocal fremitus resonant to percussion  Abdomen: Soft, nontender, nondistended, positive bowel sounds, no masses no hepatosplenomegaly noted..  Neuro: No focal neurological deficits noted cranial nerves II through XII grossly intact. DTRs 2+ bilaterally upper and lower extremities. Strength 5 out of 5 in bilateral upper and lower extremities. Musculoskeletal: No warm swelling or erythema around joints, no spinal tenderness noted. Psychiatric: Patient alert and oriented x3, good insight and cognition, good recent to remote recall. Lymph node survey: No cervical axillary or inguinal lymphadenopathy noted.  Lab Results:  Basic Metabolic Panel:    Component Value Date/Time   NA 140 04/03/2021 0744   NA 139 11/08/2020 0948   NA 139 01/20/2014 0758   K 3.4 (L) 04/03/2021 0744   K 3.9 01/20/2014 0758   CL 112 (H) 04/03/2021 0744   CO2 24 04/03/2021 0744   CO2 23 01/20/2014 0758   BUN 9 04/03/2021 0744   BUN 5 (L) 11/08/2020 0948   BUN 6.0 (L) 01/20/2014 0758   CREATININE 0.36 (L) 04/03/2021 0744   CREATININE 0.30 (L) 12/20/2016 0925   CREATININE 0.5 (L) 01/20/2014 0758   GLUCOSE 91 04/03/2021 0744   GLUCOSE 72 01/20/2014 0758   CALCIUM 8.1 (L) 04/03/2021 0744   CALCIUM 9.0 01/20/2014 0758  CBC:    Component Value Date/Time   WBC 13.3 (H) 04/03/2021 0744   HGB 7.1 (L) 04/03/2021 0744   HGB 5.7 (LL) 11/08/2020 0948   HGB 8.7 (L) 01/20/2014 0757   HCT 20.7 (L) 04/03/2021 0744   HCT 16.0 (LL) 11/08/2020 0948   HCT 25.2 (L) 01/20/2014 0757   PLT 217 04/03/2021 0744   PLT 464 (H) 11/08/2020 0948   MCV 89.6 04/03/2021 0744   MCV 88 11/08/2020 0948   MCV 84.3 01/20/2014 0757   NEUTROABS 7.7 04/03/2021 0744   NEUTROABS 9.3 (H) 11/08/2020 0948   NEUTROABS  8.7 (H) 01/20/2014 0757   LYMPHSABS 2.5 04/03/2021 0744   LYMPHSABS 2.3 11/08/2020 0948   LYMPHSABS 2.7 01/20/2014 0757   MONOABS 2.0 (H) 04/03/2021 0744   MONOABS 1.9 (H) 01/20/2014 0757   EOSABS 1.0 (H) 04/03/2021 0744   EOSABS 0.5 (H) 11/08/2020 0948   BASOSABS 0.2 (H) 04/03/2021 0744   BASOSABS 0.1 11/08/2020 0948   BASOSABS 0.2 (H) 01/20/2014 0757    Recent Results (from the past 240 hour(s))  Blood culture (routine x 2)     Status: None   Collection Time: 03/28/21 12:14 AM   Specimen: BLOOD RIGHT FOREARM  Result Value Ref Range Status   Specimen Description BLOOD RIGHT FOREARM  Final   Special Requests   Final    BOTTLES DRAWN AEROBIC AND ANAEROBIC Blood Culture adequate volume   Culture   Final    NO GROWTH 5 DAYS Performed at [facility], [address], [place], [place] [address]    Report Status 04/02/2021 FINAL  Final  Blood culture (routine x 2)     Status: None   Collection Time: 03/28/21 12:14 AM   Specimen: BLOOD LEFT FOREARM  Result Value Ref Range Status   Specimen Description BLOOD LEFT FOREARM  Final   Special Requests   Final    BOTTLES DRAWN AEROBIC AND ANAEROBIC Blood Culture results may not be optimal due to an excessive volume of blood received in culture bottles   Culture   Final    NO GROWTH 5 DAYS Performed at [facility], [place] [address], [place], [place] [address]    Report Status 04/02/2021 FINAL  Final  Resp Panel by RT-PCR (Flu A&B, Covid) Nasopharyngeal Swab     Status: None   Collection Time: 03/28/21 12:22 AM   Specimen: Nasopharyngeal Swab; Nasopharyngeal(NP) swabs in vial transport medium  Result Value Ref Range Status   SARS Coronavirus 2 by RT PCR NEGATIVE NEGATIVE Final    Comment: (NOTE) SARS-CoV-2 target nucleic acids are NOT DETECTED.  The SARS-CoV-2 RNA is generally detectable in upper respiratory specimens during the acute phase of infection. The lowest concentration of SARS-CoV-2 viral copies this assay can  detect is 138 copies/mL. A negative result does not preclude SARS-Cov-2 infection and should not be used as the sole basis for treatment or other patient management decisions. A negative result may occur with  improper specimen collection/handling, submission of specimen other than nasopharyngeal swab, presence of viral mutation(s) within the areas targeted by this assay, and inadequate number of viral copies(<138 copies/mL). A negative result must be combined with clinical observations, patient history, and epidemiological information. The expected result is Negative.  Fact Sheet for Patients:  EntrepreneurPulse.com.au  Fact Sheet for Healthcare Providers:  IncredibleEmployment.be  This test is no t yet approved or cleared by the [place] FDA and  has been authorized for detection and/or diagnosis of SARS-CoV-2  by FDA under an Emergency Use Authorization (EUA). This EUA will remain  in effect (meaning this test can be used) for the duration of the COVID-19 declaration under Section 564(b)(1) of the Act, 21 U.S.C.section 360bbb-3(b)(1), unless the authorization is terminated  or revoked sooner.       Influenza A by PCR NEGATIVE NEGATIVE Final   Influenza B by PCR NEGATIVE NEGATIVE Final    Comment: (NOTE) The Xpert Xpress SARS-CoV-2/FLU/RSV plus assay is intended as an aid in the diagnosis of influenza from Nasopharyngeal swab specimens and should not be used as a sole basis for treatment. Nasal washings and aspirates are unacceptable for Xpert Xpress SARS-CoV-2/FLU/RSV testing.  Fact Sheet for Patients: EntrepreneurPulse.com.au  Fact Sheet for Healthcare Providers: IncredibleEmployment.be  This test is not yet approved or cleared by the [place] FDA and has been authorized for detection and/or diagnosis of SARS-CoV-2 by FDA under an Emergency Use Authorization (EUA). This EUA will remain in  effect (meaning this test can be used) for the duration of the COVID-19 declaration under Section 564(b)(1) of the Act, 21 U.S.C. section 360bbb-3(b)(1), unless the authorization is terminated or revoked.  Performed at [facility], [place] [address] [place], [place] [address]   Aerobic Culture w Gram Stain (superficial specimen)     Status: None   Collection Time: 03/28/21  7:02 PM   Specimen: Foot  Result Value Ref Range Status   Specimen Description   Final    FOOT RIGHT Performed at [facility] [address] [place], [place] [address]    Special Requests   Final    Immunocompromised Performed at [facility], [place] [address] [place], [place] [address]    Gram Stain   Final    RARE WBC PRESENT, PREDOMINANTLY PMN RARE GRAM POSITIVE COCCI IN PAIRS Performed at [facility], [place] [address], [place], [place] [address]    Culture RARE PSEUDOMONAS AERUGINOSA  Final   Report Status 04/01/2021 FINAL  Final   Organism ID, Bacteria PSEUDOMONAS AERUGINOSA  Final      Susceptibility   Pseudomonas aeruginosa - MIC*    CEFTAZIDIME 4 SENSITIVE Sensitive     CIPROFLOXACIN <=0.25 SENSITIVE Sensitive     GENTAMICIN <=1 SENSITIVE Sensitive     IMIPENEM 1 SENSITIVE Sensitive     PIP/TAZO 16 SENSITIVE Sensitive     CEFEPIME 2 SENSITIVE Sensitive     * RARE PSEUDOMONAS AERUGINOSA  Aerobic Culture w Gram Stain (superficial specimen)     Status: Abnormal   Collection Time: 03/28/21  7:02 PM   Specimen: Foot  Result Value Ref Range Status   Specimen Description   Final    FOOT LEFT Performed at [facility], [place] [address] [place], [place] [address]    Special Requests   Final    NONE Performed at [facility], [place] [address], [place], [place] [address]    Gram Stain   Final    MODERATE WBC PRESENT,BOTH PMN AND MONONUCLEAR FEW GRAM NEGATIVE RODS FEW GRAM POSITIVE COCCI IN PAIRS FEW GRAM NEGATIVE  COCCI IN PAIRS    Culture (A)  Final    MULTIPLE ORGANISMS PRESENT, NONE PREDOMINANT NO GROUP A STREP (S.PYOGENES) ISOLATED NO STAPHYLOCOCCUS AUREUS ISOLATED Performed at [facility], [place] [address], [place], [place] [address]    Report Status 03/31/2021 FINAL  Final    Studies/Results: No results found.  Medications: Scheduled Meds:  ciprofloxacin  500  mg Oral BID   gabapentin  300 mg Oral TID   HYDROmorphone   Intravenous Q4H   oxyCODONE  10 mg Oral Q4H while awake   senna-docusate  1 tablet Oral BID   Continuous Infusions:  diphenhydrAMINE     PRN Meds:.acetaminophen, diphenhydrAMINE **OR** diphenhydrAMINE, naloxone **AND** sodium chloride flush, ondansetron (ZOFRAN) IV, polyethylene glycol  Consultants: Vein and vascular specialist  Antibiotics: Ciprofloxacin po Assessment/Plan: Principal Problem:   Sickle cell pain crisis ([place]) Active Problems:   Complicated wound infection   Low hemoglobin   Bilateral leg ulcer (HCC)  Bilateral leg ulcers: Vein and vascular consult.  Due to profuse left ankle bleeding.  Hemostasis obtained.  Will defer to specialists for follow-up, appreciate input. Transition to oral antibiotics today Continue other supportive care and wound dressing  Sickle cell disease with pain crisis: Continue IV Dilaudid PCA Gabapentin 300 mg every 8 hours IV Toradol 15 mg every 6 hours for total of 5 days Continue home medications as ordered Monitor vital signs closely, reevaluate pain scale regularly, and supplemental oxygen as needed.  Anemia of chronic disease: Today, patient's hemoglobin is 7.1 g/dL.  Patient is status post 2 units PRBCs on yesterday.  Maintain hemoglobin above 7.0 g/dL. Follow labs in am  Chronic pain syndrome: Continue home medications    Code Status: Full Code Family Communication: N/A Disposition Plan: Not yet ready for discharge  [place], MSN, FNP-C Patient [place], [place] [address] [phone]  If 5PM-8AM, please contact night-coverage.  04/03/2021, 5:32 PM  LOS: 6 days

## 2021-04-04 NOTE — Progress Notes (Addendum)
Subjective: [name] is a 49 year old female with a medical history significant for sickle cell disease, chronic pain syndrome, opiate dependence and tolerance, and bilateral lower extremity wounds that was admitted for bilateral wound infection in the setting of sickle cell pain crisis.  Previously patient began bleeding profusely from her left medial ankle that was noted to be pulsatile.  At that time, patient was given a fluid bolus and pain and vascular specialist was consulted for bleeding from the left ankle.  It was found that the nature of bleeding was a 5 branch of the posterior tibial artery, her surgeon it was definitively impossible to tell given the patient's significant pain to that area.  Surgeon was able to gain hemostasis. Patient is not having bleeding at this time.  She is having a significant amount of pain to left lower extremity characterized as constant and shooting.  Patient has not had any bleeding overnight. She continues to have significant lower extremity drainage.  There has been no follow-up per vascular surgery.  Pain intensity improved overnight. Patient rates pain as 5/10. She is unable to apply pressure to lower extremities.   She denies dizziness, headache, chest pain, urinary symptoms, nausea, vomiting, or diarrhea.  Objective:  Vital signs in last 24 hours:  Vitals:   04/04/21 0956 04/04/21 1143 04/04/21 1417 04/04/21 1553  BP: 112/69  106/61   Pulse: 71  (!) 58   Resp: 14 14 14 14   Temp: 98.4 F (36.9 C)  98.1 F (36.7 C)   TempSrc: Oral  Oral   SpO2: 100% 100% 100% 100%  Weight:      Height:        Intake/Output from previous day:   Intake/Output Summary (Last 24 hours) at 04/04/2021 1717 Last data filed at 04/04/2021 1418 Gross per 24 hour  Intake 240 ml  Output 2050 ml  Net -1810 ml    Physical Exam: General: Alert, awake, oriented x3, in no acute distress.  HEENT: [place]/AT PEERL, EOMI Neck: Trachea midline,  no masses, no  thyromegal,y no JVD, no carotid bruit OROPHARYNX:  Moist, No exudate/ erythema/lesions.  Heart: Regular rate and rhythm, without murmurs, rubs, gallops, PMI non-displaced, no heaves or thrills on palpation.  Lungs: Clear to auscultation, no wheezing or rhonchi noted. No increased vocal fremitus resonant to percussion  Abdomen: Soft, nontender, nondistended, positive bowel sounds, no masses no hepatosplenomegaly noted..  Neuro: No focal neurological deficits noted cranial nerves II through XII grossly intact. DTRs 2+ bilaterally upper and lower extremities. Strength 5 out of 5 in bilateral upper and lower extremities. Musculoskeletal: No warm swelling or erythema around joints, no spinal tenderness noted. Psychiatric: Patient alert and oriented x3, good insight and cognition, good recent to remote recall. Lymph node survey: No cervical axillary or inguinal lymphadenopathy noted.  Lab Results:  Basic Metabolic Panel:    Component Value Date/Time   NA 140 04/03/2021 0744   NA 139 11/08/2020 0948   NA 139 01/20/2014 0758   K 3.4 (L) 04/03/2021 0744   K 3.9 01/20/2014 0758   CL 112 (H) 04/03/2021 0744   CO2 24 04/03/2021 0744   CO2 23 01/20/2014 0758   BUN 9 04/03/2021 0744   BUN 5 (L) 11/08/2020 0948   BUN 6.0 (L) 01/20/2014 0758   CREATININE 0.36 (L) 04/03/2021 0744   CREATININE 0.30 (L) 12/20/2016 0925   CREATININE 0.5 (L) 01/20/2014 0758   GLUCOSE 91 04/03/2021 0744   GLUCOSE 72 01/20/2014 0758   CALCIUM 8.1 (L)  04/03/2021 0744   CALCIUM 9.0 01/20/2014 0758   CBC:    Component Value Date/Time   WBC 13.3 (H) 04/03/2021 0744   HGB 7.1 (L) 04/03/2021 0744   HGB 5.7 (LL) 11/08/2020 0948   HGB 8.7 (L) 01/20/2014 0757   HCT 20.7 (L) 04/03/2021 0744   HCT 16.0 (LL) 11/08/2020 0948   HCT 25.2 (L) 01/20/2014 0757   PLT 217 04/03/2021 0744   PLT 464 (H) 11/08/2020 0948   MCV 89.6 04/03/2021 0744   MCV 88 11/08/2020 0948   MCV 84.3 01/20/2014 0757   NEUTROABS 7.7 04/03/2021 0744    NEUTROABS 9.3 (H) 11/08/2020 0948   NEUTROABS 8.7 (H) 01/20/2014 0757   LYMPHSABS 2.5 04/03/2021 0744   LYMPHSABS 2.3 11/08/2020 0948   LYMPHSABS 2.7 01/20/2014 0757   MONOABS 2.0 (H) 04/03/2021 0744   MONOABS 1.9 (H) 01/20/2014 0757   EOSABS 1.0 (H) 04/03/2021 0744   EOSABS 0.5 (H) 11/08/2020 0948   BASOSABS 0.2 (H) 04/03/2021 0744   BASOSABS 0.1 11/08/2020 0948   BASOSABS 0.2 (H) 01/20/2014 0757    Recent Results (from the past 240 hour(s))  Blood culture (routine x 2)     Status: None   Collection Time: 03/28/21 12:14 AM   Specimen: BLOOD RIGHT FOREARM  Result Value Ref Range Status   Specimen Description BLOOD RIGHT FOREARM  Final   Special Requests   Final    BOTTLES DRAWN AEROBIC AND ANAEROBIC Blood Culture adequate volume   Culture   Final    NO GROWTH 5 DAYS Performed at [facility], [address], [place], [place] [address]    Report Status 04/02/2021 FINAL  Final  Blood culture (routine x 2)     Status: None   Collection Time: 03/28/21 12:14 AM   Specimen: BLOOD LEFT FOREARM  Result Value Ref Range Status   Specimen Description BLOOD LEFT FOREARM  Final   Special Requests   Final    BOTTLES DRAWN AEROBIC AND ANAEROBIC Blood Culture results may not be optimal due to an excessive volume of blood received in culture bottles   Culture   Final    NO GROWTH 5 DAYS Performed at [facility], [place] [address] [place], [place] [address]    Report Status 04/02/2021 FINAL  Final  Resp Panel by RT-PCR (Flu A&B, Covid) Nasopharyngeal Swab     Status: None   Collection Time: 03/28/21 12:22 AM   Specimen: Nasopharyngeal Swab; Nasopharyngeal(NP) swabs in vial transport medium  Result Value Ref Range Status   SARS Coronavirus 2 by RT PCR NEGATIVE NEGATIVE Final    Comment: (NOTE) SARS-CoV-2 target nucleic acids are NOT DETECTED.  The SARS-CoV-2 RNA is generally detectable in upper respiratory specimens during the acute phase of infection. The  lowest concentration of SARS-CoV-2 viral copies this assay can detect is 138 copies/mL. A negative result does not preclude SARS-Cov-2 infection and should not be used as the sole basis for treatment or other patient management decisions. A negative result may occur with  improper specimen collection/handling, submission of specimen other than nasopharyngeal swab, presence of viral mutation(s) within the areas targeted by this assay, and inadequate number of viral copies(<138 copies/mL). A negative result must be combined with clinical observations, patient history, and epidemiological information. The expected result is Negative.  Fact Sheet for Patients:  EntrepreneurPulse.com.au  Fact Sheet for Healthcare Providers:  IncredibleEmployment.be  This test is no t yet approved or cleared by the [place] and  has been authorized for detection and/or diagnosis of SARS-CoV-2 by FDA under an Emergency Use Authorization (EUA). This EUA will remain  in effect (meaning this test can be used) for the duration of the COVID-19 declaration under Section 564(b)(1) of the Act, 21 U.S.C.section 360bbb-3(b)(1), unless the authorization is terminated  or revoked sooner.       Influenza A by PCR NEGATIVE NEGATIVE Final   Influenza B by PCR NEGATIVE NEGATIVE Final    Comment: (NOTE) The Xpert Xpress SARS-CoV-2/FLU/RSV plus assay is intended as an aid in the diagnosis of influenza from Nasopharyngeal swab specimens and should not be used as a sole basis for treatment. Nasal washings and aspirates are unacceptable for Xpert Xpress SARS-CoV-2/FLU/RSV testing.  Fact Sheet for Patients: EntrepreneurPulse.com.au  Fact Sheet for Healthcare Providers: IncredibleEmployment.be  This test is not yet approved or cleared by the [place] FDA and has been authorized for detection and/or diagnosis of SARS-CoV-2 by FDA under  an Emergency Use Authorization (EUA). This EUA will remain in effect (meaning this test can be used) for the duration of the COVID-19 declaration under Section 564(b)(1) of the Act, 21 U.S.C. section 360bbb-3(b)(1), unless the authorization is terminated or revoked.  Performed at [facility], [place] [address] [place], [place] [address]   Aerobic Culture w Gram Stain (superficial specimen)     Status: None   Collection Time: 03/28/21  7:02 PM   Specimen: Foot  Result Value Ref Range Status   Specimen Description   Final    FOOT RIGHT Performed at [place] [address], [place], [place] [address]    Special Requests   Final    Immunocompromised Performed at [facility], [place] [address], [place], [place] [address]    Gram Stain   Final    RARE WBC PRESENT, PREDOMINANTLY PMN RARE GRAM POSITIVE COCCI IN PAIRS Performed at [facility], [place] [address] [place], [place] [address]    Culture RARE PSEUDOMONAS AERUGINOSA  Final   Report Status 04/01/2021 FINAL  Final   Organism ID, Bacteria PSEUDOMONAS AERUGINOSA  Final      Susceptibility   Pseudomonas aeruginosa - MIC*    CEFTAZIDIME 4 SENSITIVE Sensitive     CIPROFLOXACIN <=0.25 SENSITIVE Sensitive     GENTAMICIN <=1 SENSITIVE Sensitive     IMIPENEM 1 SENSITIVE Sensitive     PIP/TAZO 16 SENSITIVE Sensitive     CEFEPIME 2 SENSITIVE Sensitive     * RARE PSEUDOMONAS AERUGINOSA  Aerobic Culture w Gram Stain (superficial specimen)     Status: Abnormal   Collection Time: 03/28/21  7:02 PM   Specimen: Foot  Result Value Ref Range Status   Specimen Description   Final    FOOT LEFT Performed at [facility], [place] [address] [place] [address], [place], [place] [address]    Special Requests   Final    NONE Performed at [facility], [place] [address] [place], [place] [address]    Gram Stain   Final    MODERATE WBC PRESENT,BOTH PMN AND MONONUCLEAR FEW GRAM  NEGATIVE RODS FEW GRAM POSITIVE COCCI IN PAIRS FEW GRAM NEGATIVE COCCI IN PAIRS    Culture (A)  Final    MULTIPLE ORGANISMS PRESENT, NONE PREDOMINANT NO GROUP A STREP (S.PYOGENES) ISOLATED NO STAPHYLOCOCCUS AUREUS ISOLATED Performed at [facility], [place] [address] [place], [place] [address]    Report Status 03/31/2021 FINAL  Final    Studies/Results: No results  found.  Medications: Scheduled Meds:  ciprofloxacin  500 mg Oral BID   gabapentin  300 mg Oral TID   HYDROmorphone   Intravenous Q4H   oxyCODONE  10 mg Oral Q4H while awake   senna-docusate  1 tablet Oral BID   Continuous Infusions:  diphenhydrAMINE     PRN Meds:.acetaminophen, diphenhydrAMINE **OR** diphenhydrAMINE, naloxone **AND** sodium chloride flush, ondansetron (ZOFRAN) IV, polyethylene glycol  Consultants: Vein and vascular specialist  Antibiotics: Ciprofloxacin po Assessment/Plan: Principal Problem:   Sickle cell pain crisis ([place]) Active Problems:   Complicated wound infection   Low hemoglobin   Bilateral leg ulcer (HCC)  Bilateral leg ulcers: Vein and vascular consult.  Discussed patient case with Dr.Brabham.  States the Prolene stitch can remain in place, no follow-up needed at this time.  Patient tolerating oral antibiotics well hemoglobin stable.  Continue other supportive care and wound dressing  Sickle cell disease with pain crisis: Continue IV Dilaudid PCA Gabapentin 300 mg every 8 hours IV Toradol 15 mg every 6 hours for total of 5 days Continue home medications as ordered Monitor vital signs closely, reevaluate pain scale regularly, and supplemental oxygen as needed.  Anemia of chronic disease: Today, patient's hemoglobin is 7.1 g/dL.  Patient is status post 2 units PRBCs on yesterday.  Maintain hemoglobin above 7.0 g/dL. Follow labs in am  Chronic pain syndrome: Continue home medications    Code Status: Full Code Family Communication: N/A Disposition Plan: Not yet ready  for discharge  [place], MSN, FNP-C Patient [place], [place] [address] [phone]  If 5PM-8AM, please contact night-coverage.  04/04/2021, 5:17 PM  LOS: 7 days

## 2021-04-04 NOTE — Progress Notes (Signed)
Patient prefers not to have dressings done by nursing staff.

## 2021-04-04 NOTE — Consult Note (Signed)
Case discussed with Dr. Donzetta Matters.  A Prolene suture was placed into the bleeding area of the medial left ankle wound for hemostasis on 04/02/2021.  This does not need to be removed.  The patient can continue with ongoing wound care.  Please contact vascular surgery with any other questions.  [name]

## 2021-04-05 LAB — CBC
HCT: 22.2 % — ABNORMAL LOW (ref 36.0–46.0)
Hemoglobin: 7.3 g/dL — ABNORMAL LOW (ref 12.0–15.0)
MCH: 30.3 pg (ref 26.0–34.0)
MCHC: 32.9 g/dL (ref 30.0–36.0)
MCV: 92.1 fL (ref 80.0–100.0)
Platelets: 311 10*3/uL (ref 150–400)
RBC: 2.41 MIL/uL — ABNORMAL LOW (ref 3.87–5.11)
RDW: 18.8 % — ABNORMAL HIGH (ref 11.5–15.5)
WBC: 13.6 10*3/uL — ABNORMAL HIGH (ref 4.0–10.5)
nRBC: 0.1 % (ref 0.0–0.2)

## 2021-04-05 LAB — COMPREHENSIVE METABOLIC PANEL
ALT: 18 U/L (ref 0–44)
AST: 37 U/L (ref 15–41)
Albumin: 3.3 g/dL — ABNORMAL LOW (ref 3.5–5.0)
Alkaline Phosphatase: 40 U/L (ref 38–126)
Anion gap: 5 (ref 5–15)
BUN: <5 mg/dL — ABNORMAL LOW (ref 6–20)
CO2: 28 mmol/L (ref 22–32)
Calcium: 8.6 mg/dL — ABNORMAL LOW (ref 8.9–10.3)
Chloride: 106 mmol/L (ref 98–111)
Creatinine, Ser: 0.35 mg/dL — ABNORMAL LOW (ref 0.44–1.00)
GFR, Estimated: >60 mL/min (ref >60)
Glucose, Bld: 95 mg/dL (ref 70–99)
Potassium: 3.3 mmol/L — ABNORMAL LOW (ref 3.5–5.1)
Sodium: 139 mmol/L (ref 135–145)
Total Bilirubin: 1.1 mg/dL (ref 0.3–1.2)
Total Protein: 6.5 g/dL (ref 6.5–8.1)

## 2021-04-05 MED ORDER — ENSURE ENLIVE PO LIQD
237.0000 mL | Freq: Two times a day (BID) | ORAL | Status: DC
  Administered 2021-04-05 – 2021-04-11 (×13): 237 mL via ORAL

## 2021-04-05 MED ORDER — JUVEN PO PACK
1.0000 | PACK | Freq: Two times a day (BID) | ORAL | Status: DC
  Administered 2021-04-05 – 2021-04-11 (×11): 1 via ORAL
  Filled 2021-04-05 (×15): qty 1

## 2021-04-05 MED ORDER — ADULT MULTIVITAMIN W/MINERALS CH
1.0000 | ORAL_TABLET | Freq: Every day | ORAL | Status: DC
  Administered 2021-04-05 – 2021-04-11 (×7): 1 via ORAL
  Filled 2021-04-05 (×7): qty 1

## 2021-04-05 NOTE — Progress Notes (Signed)
Subjective: [name] is a 50 year old female with a medical history significant for sickle cell disease, chronic pain syndrome, opiate dependence and tolerance, and bilateral lower extremity wounds that was admitted for bilateral wound infection in the setting of sickle cell pain crisis.  Previously patient began bleeding profusely from her left medial ankle that was noted to be pulsatile.  At that time, patient was given a fluid bolus and pain and vascular specialist was consulted for bleeding from the left ankle.  It was found that the nature of bleeding was a 5 branch of the posterior tibial artery, her surgeon it was definitively impossible to tell given the patient's significant pain to that area.  Surgeon was able to gain hemostasis. Patient is not having bleeding at this time.     Pain intensity improved overnight. Patient rates pain as 5/10. She is unable to apply pressure to lower extremities.  Patient continues to be unable to bear weight to lower extremities.  She reports excruciating pain with prolonged standing and ambulation.  She denies dizziness, headache, chest pain, urinary symptoms, nausea, vomiting, or diarrhea.  Objective:  Vital signs in last 24 hours:  Vitals:   04/05/21 0447 04/05/21 0718 04/05/21 0924 04/05/21 1146  BP: 114/66  120/63   Pulse: (!) 56  60   Resp: 14 14  14   Temp: 98 F (36.7 C)     TempSrc: Oral  Oral   SpO2: 100% 100% 100% 100%  Weight:      Height:        Intake/Output from previous day:   Intake/Output Summary (Last 24 hours) at 04/05/2021 1232 Last data filed at 04/05/2021 1000 Gross per 24 hour  Intake 240 ml  Output 1350 ml  Net -1110 ml    Physical Exam: General: Alert, awake, oriented x3, in no acute distress.  HEENT: [place]/AT PEERL, EOMI Neck: Trachea midline,  no masses, no thyromegal,y no JVD, no carotid bruit OROPHARYNX:  Moist, No exudate/ erythema/lesions.  Heart: Regular rate and rhythm, without murmurs, rubs,  gallops, PMI non-displaced, no heaves or thrills on palpation.  Lungs: Clear to auscultation, no wheezing or rhonchi noted. No increased vocal fremitus resonant to percussion  Abdomen: Soft, nontender, nondistended, positive bowel sounds, no masses no hepatosplenomegaly noted..  Neuro: No focal neurological deficits noted cranial nerves II through XII grossly intact. DTRs 2+ bilaterally upper and lower extremities. Strength 5 out of 5 in bilateral upper and lower extremities. Musculoskeletal: No warm swelling or erythema around joints, no spinal tenderness noted. Psychiatric: Patient alert and oriented x3, good insight and cognition, good recent to remote recall. Lymph node survey: No cervical axillary or inguinal lymphadenopathy noted.  Lab Results:  Basic Metabolic Panel:    Component Value Date/Time   NA 140 04/03/2021 0744   NA 139 11/08/2020 0948   NA 139 01/20/2014 0758   K 3.4 (L) 04/03/2021 0744   K 3.9 01/20/2014 0758   CL 112 (H) 04/03/2021 0744   CO2 24 04/03/2021 0744   CO2 23 01/20/2014 0758   BUN 9 04/03/2021 0744   BUN 5 (L) 11/08/2020 0948   BUN 6.0 (L) 01/20/2014 0758   CREATININE 0.36 (L) 04/03/2021 0744   CREATININE 0.30 (L) 12/20/2016 0925   CREATININE 0.5 (L) 01/20/2014 0758   GLUCOSE 91 04/03/2021 0744   GLUCOSE 72 01/20/2014 0758   CALCIUM 8.1 (L) 04/03/2021 0744   CALCIUM 9.0 01/20/2014 0758   CBC:    Component Value Date/Time   WBC 13.3 (H)  04/03/2021 0744   HGB 7.1 (L) 04/03/2021 0744   HGB 5.7 (LL) 11/08/2020 0948   HGB 8.7 (L) 01/20/2014 0757   HCT 20.7 (L) 04/03/2021 0744   HCT 16.0 (LL) 11/08/2020 0948   HCT 25.2 (L) 01/20/2014 0757   PLT 217 04/03/2021 0744   PLT 464 (H) 11/08/2020 0948   MCV 89.6 04/03/2021 0744   MCV 88 11/08/2020 0948   MCV 84.3 01/20/2014 0757   NEUTROABS 7.7 04/03/2021 0744   NEUTROABS 9.3 (H) 11/08/2020 0948   NEUTROABS 8.7 (H) 01/20/2014 0757   LYMPHSABS 2.5 04/03/2021 0744   LYMPHSABS 2.3 11/08/2020 0948    LYMPHSABS 2.7 01/20/2014 0757   MONOABS 2.0 (H) 04/03/2021 0744   MONOABS 1.9 (H) 01/20/2014 0757   EOSABS 1.0 (H) 04/03/2021 0744   EOSABS 0.5 (H) 11/08/2020 0948   BASOSABS 0.2 (H) 04/03/2021 0744   BASOSABS 0.1 11/08/2020 0948   BASOSABS 0.2 (H) 01/20/2014 0757    Recent Results (from the past 240 hour(s))  Blood culture (routine x 2)     Status: None   Collection Time: 03/28/21 12:14 AM   Specimen: BLOOD RIGHT FOREARM  Result Value Ref Range Status   Specimen Description BLOOD RIGHT FOREARM  Final   Special Requests   Final    BOTTLES DRAWN AEROBIC AND ANAEROBIC Blood Culture adequate volume   Culture   Final    NO GROWTH 5 DAYS Performed at [facility], [address], [place], [place] [address]    Report Status 04/02/2021 FINAL  Final  Blood culture (routine x 2)     Status: None   Collection Time: 03/28/21 12:14 AM   Specimen: BLOOD LEFT FOREARM  Result Value Ref Range Status   Specimen Description BLOOD LEFT FOREARM  Final   Special Requests   Final    BOTTLES DRAWN AEROBIC AND ANAEROBIC Blood Culture results may not be optimal due to an excessive volume of blood received in culture bottles   Culture   Final    NO GROWTH 5 DAYS Performed at [facility], [place] [address] [place],  [address]    Report Status 04/02/2021 FINAL  Final  Resp Panel by RT-PCR (Flu A&B, Covid) Nasopharyngeal Swab     Status: None   Collection Time: 03/28/21 12:22 AM   Specimen: Nasopharyngeal Swab; Nasopharyngeal(NP) swabs in vial transport medium  Result Value Ref Range Status   SARS Coronavirus 2 by RT PCR NEGATIVE NEGATIVE Final    Comment: (NOTE) SARS-CoV-2 target nucleic acids are NOT DETECTED.  The SARS-CoV-2 RNA is generally detectable in upper respiratory specimens during the acute phase of infection. The lowest concentration of SARS-CoV-2 viral copies this assay can detect is 138 copies/mL. A negative result does not preclude SARS-Cov-2 infection and should  not be used as the sole basis for treatment or other patient management decisions. A negative result may occur with  improper specimen collection/handling, submission of specimen other than nasopharyngeal swab, presence of viral mutation(s) within the areas targeted by this assay, and inadequate number of viral copies(<138 copies/mL). A negative result must be combined with clinical observations, patient history, and epidemiological information. The expected result is Negative.  Fact Sheet for Patients:  EntrepreneurPulse.com.au  Fact Sheet for Healthcare Providers:  IncredibleEmployment.be  This test is no t yet approved or cleared by the [place] FDA and  has been authorized for detection and/or diagnosis of SARS-CoV-2 by FDA under an Emergency Use Authorization (EUA). This EUA will remain  in effect (meaning this test can be used) for the duration of the COVID-19 declaration under Section 564(b)(1) of the Act, 21 U.S.C.section 360bbb-3(b)(1), unless the authorization is terminated  or revoked sooner.       Influenza A by PCR NEGATIVE NEGATIVE Final   Influenza B by PCR NEGATIVE NEGATIVE Final    Comment: (NOTE) The Xpert Xpress SARS-CoV-2/FLU/RSV plus assay is intended as an aid in the diagnosis of influenza from Nasopharyngeal swab specimens and should not be used as a sole basis for treatment. Nasal washings and aspirates are unacceptable for Xpert Xpress SARS-CoV-2/FLU/RSV testing.  Fact Sheet for Patients: EntrepreneurPulse.com.au  Fact Sheet for Healthcare Providers: IncredibleEmployment.be  This test is not yet approved or cleared by the [place] FDA and has been authorized for detection and/or diagnosis of SARS-CoV-2 by FDA under an Emergency Use Authorization (EUA). This EUA will remain in effect (meaning this test can be used) for the duration of the COVID-19 declaration under  Section 564(b)(1) of the Act, 21 U.S.C. section 360bbb-3(b)(1), unless the authorization is terminated or revoked.  Performed at [facility], [place] [address] [place], [place] [address]   Aerobic Culture w Gram Stain (superficial specimen)     Status: None   Collection Time: 03/28/21  7:02 PM   Specimen: Foot  Result Value Ref Range Status   Specimen Description   Final    FOOT RIGHT Performed at [place] [address] [place], [place] [address]    Special Requests   Final    Immunocompromised Performed at [facility], [place] [address], [place], [name] [address]    Gram Stain   Final    RARE WBC PRESENT, PREDOMINANTLY PMN RARE GRAM POSITIVE COCCI IN PAIRS Performed at [facility], [place] [address], [place], [place] [address]    Culture RARE PSEUDOMONAS AERUGINOSA  Final   Report Status 04/01/2021 FINAL  Final   Organism ID, Bacteria PSEUDOMONAS AERUGINOSA  Final      Susceptibility   Pseudomonas aeruginosa - MIC*    CEFTAZIDIME 4 SENSITIVE Sensitive     CIPROFLOXACIN <=0.25 SENSITIVE Sensitive     GENTAMICIN <=1 SENSITIVE Sensitive     IMIPENEM 1 SENSITIVE Sensitive     PIP/TAZO 16 SENSITIVE Sensitive     CEFEPIME 2 SENSITIVE Sensitive     * RARE PSEUDOMONAS AERUGINOSA  Aerobic Culture w Gram Stain (superficial specimen)     Status: Abnormal   Collection Time: 03/28/21  7:02 PM   Specimen: Foot  Result Value Ref Range Status   Specimen Description   Final    FOOT LEFT Performed at [facility], [place] [address] [place], [place] [address]    Special Requests   Final    NONE Performed at [facility], [place] [address], [place], [place] [address]    Gram Stain   Final    MODERATE WBC PRESENT,BOTH PMN AND MONONUCLEAR FEW GRAM NEGATIVE RODS FEW GRAM POSITIVE COCCI IN PAIRS FEW GRAM NEGATIVE COCCI IN PAIRS    Culture (A)  Final    MULTIPLE ORGANISMS PRESENT, NONE PREDOMINANT NO  GROUP A STREP (S.PYOGENES) ISOLATED NO STAPHYLOCOCCUS AUREUS ISOLATED Performed at [facility], [place] [address], [place], [place] [address]    Report Status 03/31/2021 FINAL  Final    Studies/Results: No results found.  Medications: Scheduled Meds:  ciprofloxacin  500 mg Oral BID   feeding supplement  237 mL Oral BID BM  gabapentin  300 mg Oral TID   HYDROmorphone   Intravenous Q4H   multivitamin with minerals  1 tablet Oral Daily   nutrition supplement (JUVEN)  1 packet Oral BID BM   oxyCODONE  10 mg Oral Q4H while awake   senna-docusate  1 tablet Oral BID   Continuous Infusions:  diphenhydrAMINE     PRN Meds:.acetaminophen, diphenhydrAMINE **OR** diphenhydrAMINE, naloxone **AND** sodium chloride flush, ondansetron (ZOFRAN) IV, polyethylene glycol  Consultants: Vein and vascular specialist  Antibiotics: Ciprofloxacin po Assessment/Plan: Principal Problem:   Sickle cell pain crisis ([place]) Active Problems:   Complicated wound infection   Low hemoglobin   Bilateral leg ulcer (HCC)  Bilateral leg ulcers:  Patient tolerating oral antibiotics well hemoglobin stable.  Continue other supportive care and wound dressing  Sickle cell disease with pain crisis: Weaning IV Dilaudid PCA Gabapentin 300 mg every 8 hours IV Toradol 15 mg every 6 hours for total of 5 days Continue home medications as ordered Monitor vital signs closely, reevaluate pain scale regularly, and supplemental oxygen as needed.  Anemia of chronic disease: Today, patient's hemoglobin is 7.1 g/dL.  Patient is status post 2 units PRBCs on yesterday.  Maintain hemoglobin above 7.0 g/dL. Follow labs in am  Chronic pain syndrome: Continue home medications  Protein calorie malnutrition: Continue protein supplements.    Code Status: Full Code Family Communication: N/A Disposition Plan: Not yet ready for discharge.  Discharge plan for 04/07/2021  [name]  APRN, MSN, FNP-C Patient [facility] [place],  [address] [phone]  If 7PM-7AM, please contact night-coverage.  04/05/2021, 12:32 PM  LOS: 8 days

## 2021-04-05 NOTE — Discharge Instructions (Signed)
Suggestions For Increasing Calories And Protein °Several small meals a day are easier to eat and digest than three large ones. Space meals about 2 to 3 hours apart to maximize comfort. °Stop eating 2 to 3 hours before bed and sleep with your head elevated if gastric reflux (GERD) and heartburn are problems. °Do not eat your favorite foods if you are feeling bad. Save them for when you feel good! °Eat breakfast-type foods at any meal. Eggs are usually easy to eat and are great any time of the day. (The same goes for pancakes and waffles.) °Eat when you feel hungry. Most people have the greatest appetite in the morning because they have not eaten all night. If this is the best meal for you, then pile on those calories and other nutrients in the morning and at lunch. Then you can have a smaller dinner without losing total calories for the day. °Eat leftovers or nutritious snacks in the afternoon and early evening to round out your day. °Try homemade or commercially prepared nutrition bars and puddings, as well as calorie- and protein-rich liquid nutritional supplements. °Benefits of Physical Activity °Talk to your doctor about physical activity. Light or moderate physical activity can help maintain muscle and promote an appetite. Walking in the neighborhood or the local mall is a great way to get up, get out, and get moving. If you are unsteady on your feet, try walking around the dining room table. °Save Room for Calorie-Rich Food! °Drink most fluids between meals instead of with meals. (It is fine to have a sip to help swallow food at meal time.) Fluids (which usually have fewer calories and nutrients than solid food) can take up valuable space in your stomach. ° °Foods Recommended °High-Protein Foods °Milk products Add cheese to toast, crackers, sandwiches, baked potatoes, vegetables, soups, noodles, meat, and fruit. Use reduced-fat (2%) or whole milk in place of water when cooking cereal and cream soups. Include  cream sauces on vegetables and pasta. Add powdered milk to cream soups and mashed potatoes.  °Eggs Have hard-cooked eggs readily available in the refrigerator. Chop and add to salads, casseroles, soups, and vegetables. Make a quick egg salad. All eggs should be well cooked to avoid the risk of harmful bacteria.  °Meats, poultry, and fish Add leftover cooked meats to soups, casseroles, salads, and omelets. Make dip by mixing diced, chopped, or shredded meat with sour cream and spices.  °Beans, legumes, nuts, and seeds Sprinkle nuts and seeds on cereals, fruit, and desserts such as ice cream, pudding, and custard. Also serve nuts and seeds on vegetables, salads, and pasta. Spread peanut butter on toast, bread, English muffins, and fruit, or blend it in a milk shake. Add beans and peas to salads, soups, casseroles, and vegetable dishes.  °High-Calorie Foods °Butter, margarine, and  oils Melt butter or margarine over potatoes, rice, pasta, and cooked vegetables. Add melted butter or margarine into soups and casseroles and spread on bread for sandwiches before spreading sandwich spread or peanut butter. Sauté or stir-fry vegetables, meats, chicken and fish such as shrimp/scallops in olive or canola oil. A variety of oils add calories and can be used to marinate meat, chicken, or fish.  °Milk products Add whipping cream to desserts, pancakes, waffles, fruit, and hot chocolate, and fold it into soups and casseroles. Add sour cream to baked potatoes and vegetables.  °Salad dressing Use regular (not low-fat or diet) mayonnaise and salad dressing on sandwiches and in dips with vegetables and fruit.   °  Sweets Add jelly and honey to bread and crackers. Add jam to fruit and ice cream and as a topping over cake.  ° °Copyright 2020 © Academy of [employer] and [employer]etics. All rights reserved.  °

## 2021-04-05 NOTE — Progress Notes (Signed)
Initial Nutrition Assessment  DOCUMENTATION CODES:  Severe malnutrition in context of chronic illness  INTERVENTION:  Continue regular diet.  Add Ensure Plus High Protein po BID, each supplement provides 350 kcal and 20 grams of protein.   Add 1 packet Juven BID, each packet provides 95 calories, 2.5 grams of protein (collagen), and 9.8 grams of carbohydrate (3 grams sugar); also contains 7 grams of L-arginine and L-glutamine, 300 mg vitamin C, 15 mg vitamin E, 1.2 mcg vitamin B-12, 9.5 mg zinc, 200 mg calcium, and 1.5 g  Calcium Beta-hydroxy-Beta-methylbutyrate to support wound healing.  Add MVI with minerals daily.  Obtain updated weight.  Encourage PO and supplement intake.  NUTRITION DIAGNOSIS:  Severe Malnutrition related to chronic illness (sickle cell crises/pain) as evidenced by severe fat depletion, severe muscle depletion.  GOAL:  Patient will meet greater than or equal to 90% of their needs  MONITOR:  PO intake, Supplement acceptance, Labs, Weight trends, Skin, I & O's  REASON FOR ASSESSMENT:  Malnutrition Screening Tool    ASSESSMENT:  50 yo female with a PMH of sickle cell disease, chronic pain syndrome, opiate dependence and tolerance, and bilateral lower extremity wounds that was admitted for bilateral wound infection in the setting of sickle cell pain crisis.  Spoke with pt at bedside. Pt reports that at home, she had very little to no appetite due to pain. She reports this went on for about 2 weeks PTA.  Her appetite has since returned a bit and she has been eating better since admission. Per Epic, pt has consumed an average of 85% of meals over the last 8 meals.  Pt reported to RD that she has trouble affording food to feed her children at times and cannot afford to purchase nutrition supplements like Boost or Ensure when she has a flare of pain. RD to bring Ensure coupons and diet education on adding calories and protein to meals when this occurs.  Pt  endorses some weight loss, but is unable to tell me an exact amount. Also, pt with no recent weight history in Epic or Care Everywhere.  Pt also due for new weight. RD to order.  Recommend starting Ensure BID and Juven BID, as well as MVI with minerals to aid in wound healing.  Medications: reviewed; oxycodone PO q 4 hrs, Senokot BID  Labs: reviewed; K 3.4 (L)  NUTRITION - FOCUSED PHYSICAL EXAM: Flowsheet Row Most Recent Value  Orbital Region Moderate depletion  Upper Arm Region Severe depletion  Thoracic and Lumbar Region Moderate depletion  Buccal Region Severe depletion  Temple Region Moderate depletion  Clavicle Bone Region Severe depletion  Clavicle and Acromion Bone Region Severe depletion  Scapular Bone Region Mild depletion  Dorsal Hand Moderate depletion  Patellar Region Moderate depletion  Anterior Thigh Region Mild depletion  Posterior Calf Region Unable to assess  Edema (RD Assessment) Mild  [LUE]  Hair Reviewed  Eyes Reviewed  Mouth Reviewed  Skin Reviewed  Nails Reviewed   Diet Order:   Diet Order             Diet regular Room service appropriate? Yes; Fluid consistency: Thin  Diet effective now                  EDUCATION NEEDS:  Education needs have been addressed  Skin:  Skin Assessment: Skin Integrity Issues: Skin Integrity Issues:: Other (Comment) Other: Bilateral venous ulcers on L & R ankles  Last BM:  03/27/21 - per pt report (on [facility])  Height:  Ht Readings from Last 1 Encounters:  04/01/21 5\' 7"  (1.702 m)   Weight:  Wt Readings from Last 1 Encounters:  03/29/21 55.9 kg   BMI:  Body mass index is 19.3 kg/m.  Estimated Nutritional Needs:  Kcal:  2050-2250 Protein:  80-95 grams Fluid:  >2 L  Derrel Nip, RD, LDN (she/her/hers) Clinical Inpatient Dietitian RD Pager/After-Hours/Weekend Pager # in [place]

## 2021-04-06 ENCOUNTER — Encounter: Payer: Self-pay | Admitting: Family Medicine

## 2021-04-06 ENCOUNTER — Other Ambulatory Visit: Payer: Self-pay | Admitting: Family Medicine

## 2021-04-06 DIAGNOSIS — E43 Unspecified severe protein-calorie malnutrition: Secondary | ICD-10-CM | POA: Insufficient documentation

## 2021-04-06 DIAGNOSIS — L97912 Non-pressure chronic ulcer of unspecified part of right lower leg with fat layer exposed: Secondary | ICD-10-CM

## 2021-04-06 DIAGNOSIS — D5700 Hb-SS disease with crisis, unspecified: Secondary | ICD-10-CM | POA: Diagnosis not present

## 2021-04-06 MED ORDER — CIPROFLOXACIN HCL 500 MG PO TABS: 500.0000 mg | ORAL_TABLET | Freq: Two times a day (BID) | ORAL | 0 refills | Status: AC

## 2021-04-06 MED ORDER — ENSURE ENLIVE PO LIQD: 237.0000 mL | Freq: Two times a day (BID) | ORAL | 12 refills | Status: DC

## 2021-04-06 MED ORDER — HYDROMORPHONE HCL 1 MG/ML IJ SOLN
0.5000 mg | Freq: Once | INTRAMUSCULAR | Status: AC
  Administered 2021-04-06: 0.5 mg via INTRAVENOUS
  Filled 2021-04-06: qty 0.5

## 2021-04-06 MED ORDER — OXYCODONE HCL 10 MG PO TABS: 10.0000 mg | ORAL_TABLET | ORAL | 0 refills | Status: DC

## 2021-04-06 MED ORDER — JUVEN PO PACK: 1.0000 | PACK | Freq: Two times a day (BID) | ORAL | 5 refills | Status: DC

## 2021-04-06 MED ORDER — GABAPENTIN 300 MG PO CAPS: 300.0000 mg | ORAL_CAPSULE | Freq: Three times a day (TID) | ORAL | 2 refills | Status: DC

## 2021-04-06 NOTE — Progress Notes (Signed)
Orders Placed This Encounter  Procedures   AMB referral to wound care center    Referral Priority:   Urgent    Referral Type:   Consultation    Number of Visits Requested:   [place], MSN, FNP-C Patient [place] [address] [place], [place] [address] [phone]

## 2021-04-06 NOTE — TOC Progression Note (Signed)
Transition of Care [facility]) - Progression Note    Patient Details  Name: [name] MRN: [id number] Date of Birth: [birth year]  Transition of Care [facility]) CM/SW Contact  Remy Dia, Marjie Skiff, RN Phone Number: 04/06/2021, 2:38 PM  Clinical Narrative:     Wheelchair order received. [employer] contacted to have wheelchair delivered to pt room.

## 2021-04-07 DIAGNOSIS — D5700 Hb-SS disease with crisis, unspecified: Secondary | ICD-10-CM | POA: Diagnosis not present

## 2021-04-07 NOTE — TOC Progression Note (Signed)
Transition of Care [facility]) - Progression Note    Patient Details  Name: [name] MRN: [id number] Date of Birth: June 01, 1970  Transition of Care [facility]) CM/SW Contact  Ross Ludwig, [place] Phone Number: 04/07/2021, 5:34 PM  Clinical Narrative:     CSW was informed that patient would like home health services.  CSW explained to her that because of patient's insurance, it will be difficult to find a [facility] agency that can accept patient.  CSW explained, that this CSW will try, however patient may just have to go to outpatient rehab.  CSW provided patient with DSS phone number, because she is interested in finding out if she is eligible for any other services in home through [facility] DSS.  CSW to update patient on if any home health agencies agree to accept patient.        Expected Discharge Plan and Services                                                 Social Determinants of Health (SDOH) Interventions    Readmission Risk Interventions No flowsheet data found.

## 2021-04-07 NOTE — Progress Notes (Signed)
Subjective: 50 year old female with sickle cell disease admitted with sickle cell painful crisis as well as bilateral lower extremity wounds.  Patient is still weak and complained of pain in his higher when she gets out of bed.  He is currently at 6 out of 10 but it can go all the way to 8 out of 10 when she tries to go to the commode.  She is on Dilaudid PCA with a setting 0.5 mg bolus 10 minutes lockout and 1.5 mg an hour.  Patient has used 25 mg with 83 demands and 51 deliveries in the last 24 hours.  Her legs have been wrapped and has been seen by vascular surgery.  Patient is expecting PT consultation.  Objective: Vital signs in last 24 hours: Temp:  [97.8 F (36.6 C)-98.4 F (36.9 C)] 98.2 F (36.8 C) (11/19 0449) Pulse Rate:  [65-81] 70 (11/19 0449) Resp:  [12-20] 17 (11/19 0449) BP: (102-133)/(60-79) 102/64 (11/19 0449) SpO2:  [98 %-100 %] 100 % (11/19 0449) Weight change:  Last BM Date: 03/27/10 (per pt "since before admission")  Intake/Output from previous day: 11/18 0701 - 11/19 0700 In: -  Out: 1000 [Urine:1000] Intake/Output this shift: No intake/output data recorded.  General appearance: alert, cooperative, and no distress Neck: no adenopathy, no carotid bruit, no JVD, supple, symmetrical, trachea midline, and thyroid not enlarged, symmetric, no tenderness/mass/nodules Back: symmetric, no curvature. ROM normal. No CVA tenderness. Resp: clear to auscultation bilaterally Cardio: regular rate and rhythm, S1, S2 normal, no murmur, click, rub or gallop GI: soft, non-tender; bowel sounds normal; no masses,  no organomegaly Extremities: extremities normal, atraumatic, no cyanosis or edema and bilateral leg ulcers dressed Pulses: 2+ and symmetric Skin: Skin color, texture, turgor normal. No rashes or lesions  Lab Results: Recent Labs    04/05/21 1301  WBC 13.6*  HGB 7.3*  HCT 22.2*  PLT 311   BMET Recent Labs    04/05/21 1301  NA 139  K 3.3*  CL 106  CO2 28   GLUCOSE 95  BUN <5*  CREATININE 0.35*  CALCIUM 8.6*    Studies/Results: No results found.  Medications: I have reviewed the patient's current medications.  Assessment/Plan: 50 year old female with sickle cell disease admitted with sickle cell painful crisis.  Also lower extremity wounds.  #1 sickle cell painful crisis: Patient will be maintained on current Dilaudid PCA.  She is getting prepped for possible discharge tomorrow.  We will get PT and OT consultation.  #2 anemia of chronic disease: H&H is 7.3.  Continue monitoring.  #3 hypokalemia: Replete potassium  #4 leukocytosis: Secondary to vaso-occlusive crisis.  Continue monitoring  #5 bilateral lower extremity wounds: Continue wound care and vascular surgery  LOS: 10 days   Tamme Mozingo,LAWAL 04/07/2021, 7:47 AM

## 2021-04-08 DIAGNOSIS — D5700 Hb-SS disease with crisis, unspecified: Secondary | ICD-10-CM | POA: Diagnosis not present

## 2021-04-08 NOTE — Progress Notes (Signed)
Subjective: Patient is still complaining of 6 out of 10 pain.  She was seen and evaluated by physical therapy.  Patient unable to walk with physical therapy today.  She may need to be evaluated for possible skilled facility.  Objective: Vital signs in last 24 hours: Temp:  [97.9 F (36.6 C)-98.3 F (36.8 C)] 97.9 F (36.6 C) (11/20 1801) Pulse Rate:  [54-72] 72 (11/20 1801) Resp:  [13-19] 18 (11/20 1801) BP: (95-118)/(64-68) 98/64 (11/20 1801) SpO2:  [98 %-100 %] 100 % (11/20 1801) Weight change:  Last BM Date: 04/06/21  Intake/Output from previous day: 11/19 0701 - 11/20 0700 In: -  Out: 700 [Urine:700] Intake/Output this shift: No intake/output data recorded.  General appearance: alert, cooperative, and no distress Neck: no adenopathy, no carotid bruit, no JVD, supple, symmetrical, trachea midline, and thyroid not enlarged, symmetric, no tenderness/mass/nodules Back: symmetric, no curvature. ROM normal. No CVA tenderness. Resp: clear to auscultation bilaterally Cardio: regular rate and rhythm, S1, S2 normal, no murmur, click, rub or gallop GI: soft, non-tender; bowel sounds normal; no masses,  no organomegaly Extremities: extremities normal, atraumatic, no cyanosis or edema and bilateral leg ulcers dressed Pulses: 2+ and symmetric Skin: Skin color, texture, turgor normal. No rashes or lesions  Lab Results: No results for input(s): WBC, HGB, HCT, PLT in the last 72 hours.  BMET No results for input(s): NA, K, CL, CO2, GLUCOSE, BUN, CREATININE, CALCIUM in the last 72 hours.   Studies/Results: No results found.  Medications: I have reviewed the patient's current medications.  Assessment/Plan: 50 year old female with sickle cell disease admitted with sickle cell painful crisis.  Also lower extremity wounds.  #1 sickle cell painful crisis: Patient is much improved regarding pain medications.  She is using up to 25 mg of the Dilaudid PCA.  No Toradol.  Unable to walk with  physical therapy today.  Will be maintained on current Dilaudid PCA.  She is getting prepped for possible discharge tomorrow.  May need to be evaluated for possible skilled nursing care or at least home with home health.  #2 anemia of chronic disease: H&H is stable.  Recheck in the morning.  Continue monitoring.  #3 hypokalemia: Replete potassium  #4 leukocytosis: Secondary to vaso-occlusive crisis.  Continue monitoring  #5 bilateral lower extremity wounds: Continue wound care and vascular surgery  #6 hypokalemia: Recheck potassium tomorrow.  LOS: 11 days   Annaya Bangert,LAWAL 04/08/2021, 7:19 PM

## 2021-04-08 NOTE — Evaluation (Signed)
Physical Therapy Evaluation Patient Details Name: [name] MRN: [id number] DOB: April 15, 1971 Today's Date: 04/08/2021  History of Present Illness  50 yo female admitted with sickle cell crisis with L LE pain worse than R LE. Hx of sickle cell, venous stasis ulcer, chronic pain, LE wounds  Clinical Impression  On eval, pt required Mod A to stand with RW at bedside and Min guard assist to perform a lateral scoot transfer to drop arm recliner and a squat pivot transfer bsc without drop arm. Pt used PCA pump multiple times during session. She could not tolerate any weightbearing on her L LE. She could barely tolerate L LE being in a dependent position. Encouraged pt to try to spend some time sitting up in recliner with feet on resting on floor. Limiting factor is pain control/management. Pt is requiring use of PCA to mobilize minimally with PT. She was unable to ambulate on today. Will plan to follow and progress activity as tolerated.      Recommendations for follow up therapy are one component of a multi-disciplinary discharge planning process, led by the attending physician.  Recommendations may be updated based on patient status, additional functional criteria and insurance authorization.  Follow Up Recommendations Home health PT    Assistance Recommended at Discharge Frequent or constant Supervision/Assistance  Functional Status Assessment Patient has had a recent decline in their functional status and demonstrates the ability to make significant improvements in function in a reasonable and predictable amount of time.  Equipment Recommendations  None recommended by PT    Recommendations for Other Services       Precautions / Restrictions Precautions Precautions: Fall Precaution Comments: L foot/ankle pain worse than R Restrictions Weight Bearing Restrictions: No      Mobility  Bed Mobility Overal bed mobility: Needs Assistance Bed Mobility: Supine to Sit     Supine to sit:  Min guard     General bed mobility comments: Increased time. Pt used UEs to assist L LE off bed.    Transfers Overall transfer level: Needs assistance Equipment used: Rolling walker (2 wheels) Transfers: Sit to/from Stand;Bed to chair/wheelchair/BSC Sit to Stand: From elevated surface;Mod assist     Squat pivot transfers: Min guard    Lateral/Scoot Transfers: Min guard General transfer comment: Assist to power up, stabilize, control descent. Cues for safety, technique, hand/LE placement. Increased time. Pt only able to stand for 30 seconds-unable to tolerate weightbearing on LLE. After resting a bit, pt was able to perform lateral scoot (with dropped arm) to recliner. Then squat pivot, recliner to bsc (no drop arm)-all Min gaurd + setup    Ambulation/Gait                  Stairs            Wheelchair Mobility    Modified Rankin (Stroke Patients Only)       Balance Overall balance assessment: Needs assistance         Standing balance support: Bilateral upper extremity supported Standing balance-Leahy Scale: Poor                               Pertinent Vitals/Pain Pain Assessment: Faces Faces Pain Scale: Hurts whole lot Pain Location: L ankle/foot/LE Pain Descriptors / Indicators: Grimacing;Guarding;Moaning Pain Intervention(s): Limited activity within patient's tolerance;Monitored during session;PCA encouraged;Repositioned    Home Living Family/patient expects to be discharged to:: Private residence Living Arrangements: Spouse/significant other;Children Available Help  at Discharge: Family Type of Home: House Home Access: Stairs to enter Entrance Stairs-Rails: None Entrance Stairs-Number of Steps: 1 Alternate Level Stairs-Number of Steps: 1 flight Home Layout: Two level;Able to live on main level with bedroom/bathroom Home Equipment: Rolling Walker (2 wheels);Cane - single point;Wheelchair - manual Additional Comments: currently has  wheelchair in roomo    Prior Function Prior Level of Function : Independent/Modified Independent                     Hand Dominance        Extremity/Trunk Assessment   Upper Extremity Assessment Upper Extremity Assessment: Overall WFL for tasks assessed    Lower Extremity Assessment Lower Extremity Assessment:  (unable to completely assess 2* pain. R LE appears WFL)    Cervical / Trunk Assessment Cervical / Trunk Assessment: Normal  Communication   Communication: No difficulties (has accent but speaks [place] well)  Cognition Arousal/Alertness: Awake/alert Behavior During Therapy: WFL for tasks assessed/performed Overall Cognitive Status: Within Functional Limits for tasks assessed                                          General Comments      Exercises     Assessment/Plan    PT Assessment    PT Problem List         PT Treatment Interventions      PT Goals (Current goals can be found in the Care Plan section)  Acute Rehab PT Goals Patient Stated Goal: less pain. to be able to walk PT Goal Formulation: With patient Time For Goal Achievement: 04/22/21 Potential to Achieve Goals: Good    Frequency     Barriers to discharge        Co-evaluation               AM-PAC PT "6 Clicks" Mobility  Outcome Measure Help needed turning from your back to your side while in a flat bed without using bedrails?: A Little Help needed moving from lying on your back to sitting on the side of a flat bed without using bedrails?: A Little Help needed moving to and from a bed to a chair (including a wheelchair)?: A Little Help needed standing up from a chair using your arms (e.g., wheelchair or bedside chair)?: A Lot Help needed to walk in hospital room?: Total Help needed climbing 3-5 steps with a railing? : Total 6 Click Score: 13    End of Session   Activity Tolerance: Patient limited by pain Patient left: with call bell/phone within reach  (on bsc) Nurse Communication: Mobility status (pt requesting assistance with toilet hygiene and transfer off bsc) PT Visit Diagnosis: Pain;Other abnormalities of gait and mobility (R26.89);Difficulty in walking, not elsewhere classified (R26.2) Pain - Right/Left: Left Pain - part of body: Ankle and joints of foot;Leg    Time: 1202-1236 PT Time Calculation (min) (ACUTE ONLY): 34 min   Charges:   PT Evaluation $PT Eval Moderate Complexity: 1 Mod PT Treatments $Therapeutic Activity: 8-22 mins          Doreatha Massed, PT Acute Rehabilitation  Office: [phone] Pager: [phone]

## 2021-04-08 NOTE — TOC Progression Note (Signed)
Transition of Care [facility]) - Progression Note    Patient Details  Name: [name] MRN: [id number] Date of Birth: [birth year]  Transition of Care [facility]) CM/SW Contact  Ross Ludwig, [place] Phone Number: 04/08/2021, 5:15 PM  Clinical Narrative:     CSW called [facility], and [facility] to see if any of them can accept patient for home health.  Unfortunately, none of them can.  CSW to continue trying to find [facility] agency.        Expected Discharge Plan and Services                                                 Social Determinants of Health (SDOH) Interventions    Readmission Risk Interventions No flowsheet data found.

## 2021-04-09 DIAGNOSIS — D5700 Hb-SS disease with crisis, unspecified: Secondary | ICD-10-CM | POA: Diagnosis not present

## 2021-04-09 LAB — CBC WITH DIFFERENTIAL/PLATELET
Abs Immature Granulocytes: 0.05 10*3/uL (ref 0.00–0.07)
Basophils Absolute: 0.1 10*3/uL (ref 0.0–0.1)
Basophils Relative: 1 %
Eosinophils Absolute: 1.2 10*3/uL — ABNORMAL HIGH (ref 0.0–0.5)
Eosinophils Relative: 11 %
HCT: 21.6 % — ABNORMAL LOW (ref 36.0–46.0)
Hemoglobin: 6.7 g/dL — CL (ref 12.0–15.0)
Immature Granulocytes: 0 %
Lymphocytes Relative: 24 %
Lymphs Abs: 2.7 10*3/uL (ref 0.7–4.0)
MCH: 29.9 pg (ref 26.0–34.0)
MCHC: 31 g/dL (ref 30.0–36.0)
MCV: 96.4 fL (ref 80.0–100.0)
Monocytes Absolute: 1.5 10*3/uL — ABNORMAL HIGH (ref 0.1–1.0)
Monocytes Relative: 14 %
Neutro Abs: 5.6 10*3/uL (ref 1.7–7.7)
Neutrophils Relative %: 50 %
Platelets: 480 10*3/uL — ABNORMAL HIGH (ref 150–400)
RBC: 2.24 MIL/uL — ABNORMAL LOW (ref 3.87–5.11)
RDW: 19.7 % — ABNORMAL HIGH (ref 11.5–15.5)
WBC: 11.2 10*3/uL — ABNORMAL HIGH (ref 4.0–10.5)
nRBC: 0 % (ref 0.0–0.2)

## 2021-04-09 LAB — COMPREHENSIVE METABOLIC PANEL
ALT: 17 U/L (ref 0–44)
AST: 28 U/L (ref 15–41)
Albumin: 3.1 g/dL — ABNORMAL LOW (ref 3.5–5.0)
Alkaline Phosphatase: 42 U/L (ref 38–126)
Anion gap: 7 (ref 5–15)
BUN: 12 mg/dL (ref 6–20)
CO2: 30 mmol/L (ref 22–32)
Calcium: 8.9 mg/dL (ref 8.9–10.3)
Chloride: 103 mmol/L (ref 98–111)
Creatinine, Ser: 0.42 mg/dL — ABNORMAL LOW (ref 0.44–1.00)
GFR, Estimated: >60 mL/min (ref >60)
Glucose, Bld: 104 mg/dL — ABNORMAL HIGH (ref 70–99)
Potassium: 3.7 mmol/L (ref 3.5–5.1)
Sodium: 140 mmol/L (ref 135–145)
Total Bilirubin: 0.8 mg/dL (ref 0.3–1.2)
Total Protein: 6.3 g/dL — ABNORMAL LOW (ref 6.5–8.1)

## 2021-04-09 NOTE — Progress Notes (Signed)
PT Cancellation Note  Patient Details Name: [name] MRN: [id number] DOB: 1971/03/17   Cancelled Treatment:    Reason Eval/Treat Not Completed: Medical issues which prohibited therapy;Other (comment) (HBG 6.7)   Lelon Mast 04/09/2021, 12:35 PM

## 2021-04-09 NOTE — TOC Progression Note (Signed)
Transition of Care [facility]) - Progression Note    Patient Details  Name: [name] MRN: [id number] Date of Birth: [birth year]  Transition of Care [facility]) CM/SW Contact  Ross Ludwig, [place] Phone Number: 04/09/2021, 4:09 PM  Clinical Narrative:     CSW was informed that patient had some questions about trying to get in home help.  CSW spoke to nurse practitioner Lovett Sox who was seeing patient, who asked about resources for patient.  CSW explained to her that because patient has Medicaid, the home health agencies do not typically accept Medicaid patients.  CSW was asked about other options for patient, CSW informed her patient can go to an outpatient rehab facility, CSW included the outpatient therapy list on patient's chart.  CSW also provided contact information for [place] on AVS to see if patient is eligible for in home services through [facility].  Patient may be eligible for Medicare or disability.  CSW added contact information and address of social security office on patient's AVS to her to contact them and see if can get disability.       Expected Discharge Plan and Services  Patient discharging back home once medically ready for discharge.                                               Social Determinants of Health (SDOH) Interventions    Readmission Risk Interventions No flowsheet data found.

## 2021-04-09 NOTE — Progress Notes (Signed)
Date and time results received: 04/09/21 0555  Test: CBC Critical Value: Hgb 6.7  Name of Provider Notified: X. Blount, NP  Orders Received? Or Actions Taken?:  Acknowledged. No new orders.

## 2021-04-09 NOTE — Progress Notes (Addendum)
Referred subjective: [name] is a 50 year old female with a medical history significant for sickle cell disease, chronic pain syndrome, opiate dependence and tolerance, and bilateral lower extremity wounds that was admitted for bilateral wound infection in the setting of sickle cell pain crisis.  Previously patient began bleeding profusely from her left medial ankle that was noted to be pulsatile.  At that time, patient was given a fluid bolus and pain and vascular specialist was consulted for bleeding from the left ankle.  It was found that the nature of bleeding was a 5 branch of the posterior tibial artery, her surgeon it was definitively impossible to tell given the patient's significant pain to that area.  Surgeon was able to gain hemostasis. Patient is not having bleeding at this time.     Pain intensity improved overnight. Patient rates pain as 7/10. She is unable to apply pressure to lower extremities.  Patient continues to be unable to bear weight to lower extremities.  She reports excruciating pain with prolonged standing and ambulation.  She denies dizziness, headache, chest pain, urinary symptoms, nausea, vomiting, or diarrhea.  Objective:  Vital signs in last 24 hours:  Vitals:   04/09/21 1216 04/09/21 1405 04/09/21 1649 04/09/21 1739  BP:  115/65  111/72  Pulse:  75  84  Resp: 12 16 14 16   Temp:  98.4 F (36.9 C)  98.8 F (37.1 C)  TempSrc:  Oral  Oral  SpO2: 100% 99% 99% 99%  Weight:      Height:        Intake/Output from previous day:   Intake/Output Summary (Last 24 hours) at 04/09/2021 1757 Last data filed at 04/09/2021 0946 Gross per 24 hour  Intake 120 ml  Output --  Net 120 ml    Physical Exam: General: Alert, awake, oriented x3, in no acute distress.  HEENT: [place]/AT PEERL, EOMI Neck: Trachea midline,  no masses, no thyromegal,y no JVD, no carotid bruit OROPHARYNX:  Moist, No exudate/ erythema/lesions.  Heart: Regular rate and rhythm, without  murmurs, rubs, gallops, PMI non-displaced, no heaves or thrills on palpation.  Lungs: Clear to auscultation, no wheezing or rhonchi noted. No increased vocal fremitus resonant to percussion  Abdomen: Soft, nontender, nondistended, positive bowel sounds, no masses no hepatosplenomegaly noted..  Neuro: No focal neurological deficits noted cranial nerves II through XII grossly intact. DTRs 2+ bilaterally upper and lower extremities. Strength 5 out of 5 in bilateral upper and lower extremities. Musculoskeletal: No warm swelling or erythema around joints, no spinal tenderness noted. Psychiatric: Patient alert and oriented x3, good insight and cognition, good recent to remote recall. Lymph node survey: No cervical axillary or inguinal lymphadenopathy noted.  Lab Results:  Basic Metabolic Panel:    Component Value Date/Time   NA 140 04/09/2021 0521   NA 139 11/08/2020 0948   NA 139 01/20/2014 0758   K 3.7 04/09/2021 0521   K 3.9 01/20/2014 0758   CL 103 04/09/2021 0521   CO2 30 04/09/2021 0521   CO2 23 01/20/2014 0758   BUN 12 04/09/2021 0521   BUN 5 (L) 11/08/2020 0948   BUN 6.0 (L) 01/20/2014 0758   CREATININE 0.42 (L) 04/09/2021 0521   CREATININE 0.30 (L) 12/20/2016 0925   CREATININE 0.5 (L) 01/20/2014 0758   GLUCOSE 104 (H) 04/09/2021 0521   GLUCOSE 72 01/20/2014 0758   CALCIUM 8.9 04/09/2021 0521   CALCIUM 9.0 01/20/2014 0758   CBC:    Component Value Date/Time   WBC 11.2 (H)  04/09/2021 0521   HGB 6.7 (LL) 04/09/2021 0521   HGB 5.7 (LL) 11/08/2020 0948   HGB 8.7 (L) 01/20/2014 0757   HCT 21.6 (L) 04/09/2021 0521   HCT 16.0 (LL) 11/08/2020 0948   HCT 25.2 (L) 01/20/2014 0757   PLT 480 (H) 04/09/2021 0521   PLT 464 (H) 11/08/2020 0948   MCV 96.4 04/09/2021 0521   MCV 88 11/08/2020 0948   MCV 84.3 01/20/2014 0757   NEUTROABS 5.6 04/09/2021 0521   NEUTROABS 9.3 (H) 11/08/2020 0948   NEUTROABS 8.7 (H) 01/20/2014 0757   LYMPHSABS 2.7 04/09/2021 0521   LYMPHSABS 2.3  11/08/2020 0948   LYMPHSABS 2.7 01/20/2014 0757   MONOABS 1.5 (H) 04/09/2021 0521   MONOABS 1.9 (H) 01/20/2014 0757   EOSABS 1.2 (H) 04/09/2021 0521   EOSABS 0.5 (H) 11/08/2020 0948   BASOSABS 0.1 04/09/2021 0521   BASOSABS 0.1 11/08/2020 0948   BASOSABS 0.2 (H) 01/20/2014 0757    No results found for this or any previous visit (from the past 240 hour(s)).   Studies/Results: No results found.  Medications: Scheduled Meds:  ciprofloxacin  500 mg Oral BID   feeding supplement  237 mL Oral BID BM   gabapentin  300 mg Oral TID   HYDROmorphone   Intravenous Q4H   multivitamin with minerals  1 tablet Oral Daily   nutrition supplement (JUVEN)  1 packet Oral BID BM   oxyCODONE  10 mg Oral Q4H while awake   senna-docusate  1 tablet Oral BID   Continuous Infusions:  diphenhydrAMINE     PRN Meds:.acetaminophen, diphenhydrAMINE **OR** diphenhydrAMINE, naloxone **AND** sodium chloride flush, ondansetron (ZOFRAN) IV, polyethylene glycol  Consultants: Vein and vascular specialist  Antibiotics: Ciprofloxacin po Assessment/Plan: Principal Problem:   Sickle cell pain crisis ([place]) Active Problems:   Complicated wound infection   Low hemoglobin   Bilateral leg ulcer (HCC)   Protein-calorie malnutrition, severe  Bilateral leg ulcers:  Patient tolerating oral antibiotics well hemoglobin stable.  Continue other supportive care and wound dressing  Sickle cell disease with pain crisis: Weaning IV Dilaudid PCA Gabapentin 300 mg every 8 hours Completed IV Toradol Continue home medications as ordered Monitor vital signs closely, reevaluate pain scale regularly, and supplemental oxygen as needed.  Anemia of chronic disease: Today, patient's hemoglobin is 6.5 g/dL.  Patient is status post 2 units PRBCs on yesterday.  Maintain hemoglobin above 7.0 g/dL. Follow labs in am  Chronic pain syndrome: Continue home medications  Protein calorie malnutrition: Continue protein  supplements.    Code Status: Full Code Family Communication: N/A Disposition Plan: Not yet ready for discharge.  Discharge plan for 11/222022  [name]  APRN, MSN, FNP-C [facility] [place], [place] [address] [phone]  If 5PM-8AM, please contact night-coverage.  04/09/2021, 5:57 PM  LOS: 12 days

## 2021-04-10 DIAGNOSIS — D5700 Hb-SS disease with crisis, unspecified: Secondary | ICD-10-CM | POA: Diagnosis not present

## 2021-04-10 LAB — CBC
HCT: 21.2 % — ABNORMAL LOW (ref 36.0–46.0)
Hemoglobin: 6.5 g/dL — CL (ref 12.0–15.0)
MCH: 30.1 pg (ref 26.0–34.0)
MCHC: 30.7 g/dL (ref 30.0–36.0)
MCV: 98.1 fL (ref 80.0–100.0)
Platelets: 589 10*3/uL — ABNORMAL HIGH (ref 150–400)
RBC: 2.16 MIL/uL — ABNORMAL LOW (ref 3.87–5.11)
RDW: 19.9 % — ABNORMAL HIGH (ref 11.5–15.5)
WBC: 13.7 10*3/uL — ABNORMAL HIGH (ref 4.0–10.5)
nRBC: 0.1 % (ref 0.0–0.2)

## 2021-04-10 LAB — PREPARE RBC (CROSSMATCH)

## 2021-04-10 MED ORDER — SODIUM CHLORIDE 0.9% IV SOLUTION: Freq: Once | INTRAVENOUS | Status: AC

## 2021-04-10 MED ORDER — DIPHENHYDRAMINE HCL 50 MG/ML IJ SOLN
25.0000 mg | Freq: Once | INTRAMUSCULAR | Status: AC
  Administered 2021-04-10: 25 mg via INTRAVENOUS
  Filled 2021-04-10: qty 1

## 2021-04-10 MED ORDER — ACETAMINOPHEN 325 MG PO TABS
650.0000 mg | ORAL_TABLET | Freq: Once | ORAL | Status: AC
  Administered 2021-04-10: 650 mg via ORAL
  Filled 2021-04-10: qty 2

## 2021-04-10 NOTE — Progress Notes (Signed)
Blood was actually started at 1630 not 1622.

## 2021-04-10 NOTE — Progress Notes (Signed)
PT Cancellation Note  Patient Details Name: [name] MRN: [id number] DOB: 1971/04/02   Cancelled Treatment:    Reason Eval/Treat Not Completed: Patient at procedure or test/unavailable (IV team working with pt. Then will have transfusion. Pt stated she did not feel she could attempt getting out of bed today due to LE pain. Will follow.)   Philomena Doheny PT 04/10/2021  Acute Rehabilitation Services Pager [phone] Office [phone]

## 2021-04-10 NOTE — Progress Notes (Signed)
Referred subjective: [name] is a 50 year old female with a medical history significant for sickle cell disease, chronic pain syndrome, opiate dependence and tolerance, and bilateral lower extremity wounds that was admitted for bilateral wound infection in the setting of sickle cell pain crisis.  Patient continues to complain of bilateral lower extremity pain.  Pain is worsened with weightbearing.  Patient has been unable to get up with physical therapy due to increased pain.  Patient will discharge home with a wheelchair.  Home health RN and physical therapy recommended due to impaired mobility.  She denies dizziness, headache, chest pain, urinary symptoms, nausea, vomiting, or diarrhea.  Objective:  Vital signs in last 24 hours:  Vitals:   04/10/21 1909 04/10/21 2009 04/10/21 2050 04/10/21 2250  BP: 108/65 112/71 113/63 102/65  Pulse: 64 68 68 (!) 58  Resp: 19 18 18 17   Temp: 98.2 F (36.8 C) 98.8 F (37.1 C) 97.8 F (36.6 C) 98.1 F (36.7 C)  TempSrc: Oral Oral Oral Oral  SpO2: 100% 100% 100% 100%  Weight:      Height:        Intake/Output from previous day:   Intake/Output Summary (Last 24 hours) at 04/10/2021 2315 Last data filed at 04/10/2021 2250 Gross per 24 hour  Intake 1117.33 ml  Output 1100 ml  Net 17.33 ml    Physical Exam: General: Alert, awake, oriented x3, in no acute distress.  HEENT: [place]/AT PEERL, EOMI Neck: Trachea midline,  no masses, no thyromegal,y no JVD, no carotid bruit OROPHARYNX:  Moist, No exudate/ erythema/lesions.  Heart: Regular rate and rhythm, without murmurs, rubs, gallops, PMI non-displaced, no heaves or thrills on palpation.  Lungs: Clear to auscultation, no wheezing or rhonchi noted. No increased vocal fremitus resonant to percussion  Abdomen: Soft, nontender, nondistended, positive bowel sounds, no masses no hepatosplenomegaly noted..  Neuro: No focal neurological deficits noted cranial nerves II through XII grossly intact.  DTRs 2+ bilaterally upper and lower extremities. Strength 5 out of 5 in bilateral upper and lower extremities. Musculoskeletal: No warm swelling or erythema around joints, no spinal tenderness noted. Psychiatric: Patient alert and oriented x3, good insight and cognition, good recent to remote recall. Lymph node survey: No cervical axillary or inguinal lymphadenopathy noted.  Lab Results:  Basic Metabolic Panel:    Component Value Date/Time   NA 140 04/09/2021 0521   NA 139 11/08/2020 0948   NA 139 01/20/2014 0758   K 3.7 04/09/2021 0521   K 3.9 01/20/2014 0758   CL 103 04/09/2021 0521   CO2 30 04/09/2021 0521   CO2 23 01/20/2014 0758   BUN 12 04/09/2021 0521   BUN 5 (L) 11/08/2020 0948   BUN 6.0 (L) 01/20/2014 0758   CREATININE 0.42 (L) 04/09/2021 0521   CREATININE 0.30 (L) 12/20/2016 0925   CREATININE 0.5 (L) 01/20/2014 0758   GLUCOSE 104 (H) 04/09/2021 0521   GLUCOSE 72 01/20/2014 0758   CALCIUM 8.9 04/09/2021 0521   CALCIUM 9.0 01/20/2014 0758   CBC:    Component Value Date/Time   WBC 13.7 (H) 04/10/2021 0915   HGB 6.5 (LL) 04/10/2021 0915   HGB 5.7 (LL) 11/08/2020 0948   HGB 8.7 (L) 01/20/2014 0757   HCT 21.2 (L) 04/10/2021 0915   HCT 16.0 (LL) 11/08/2020 0948   HCT 25.2 (L) 01/20/2014 0757   PLT 589 (H) 04/10/2021 0915   PLT 464 (H) 11/08/2020 0948   MCV 98.1 04/10/2021 0915   MCV 88 11/08/2020 0948   MCV 84.3  01/20/2014 0757   NEUTROABS 5.6 04/09/2021 0521   NEUTROABS 9.3 (H) 11/08/2020 0948   NEUTROABS 8.7 (H) 01/20/2014 0757   LYMPHSABS 2.7 04/09/2021 0521   LYMPHSABS 2.3 11/08/2020 0948   LYMPHSABS 2.7 01/20/2014 0757   MONOABS 1.5 (H) 04/09/2021 0521   MONOABS 1.9 (H) 01/20/2014 0757   EOSABS 1.2 (H) 04/09/2021 0521   EOSABS 0.5 (H) 11/08/2020 0948   BASOSABS 0.1 04/09/2021 0521   BASOSABS 0.1 11/08/2020 0948   BASOSABS 0.2 (H) 01/20/2014 0757    No results found for this or any previous visit (from the past 240 hour(s)).   Studies/Results: No  results found.  Medications: Scheduled Meds:  feeding supplement  237 mL Oral BID BM   gabapentin  300 mg Oral TID   HYDROmorphone   Intravenous Q4H   multivitamin with minerals  1 tablet Oral Daily   nutrition supplement (JUVEN)  1 packet Oral BID BM   oxyCODONE  10 mg Oral Q4H while awake   senna-docusate  1 tablet Oral BID   Continuous Infusions:  diphenhydrAMINE     PRN Meds:.acetaminophen, diphenhydrAMINE **OR** diphenhydrAMINE, naloxone **AND** sodium chloride flush, ondansetron (ZOFRAN) IV, polyethylene glycol  Consultants: Vein and vascular specialist  Antibiotics: Ciprofloxacin po Assessment/Plan: Principal Problem:   Sickle cell pain crisis ([place]) Active Problems:   Complicated wound infection   Low hemoglobin   Bilateral leg ulcer (HCC)   Protein-calorie malnutrition, severe  Bilateral leg ulcers:  Patient tolerating oral antibiotics well hemoglobin stable.  Continue other supportive care and wound dressing Mobility impaired.  Patient will discharge home in a wheelchair. PT following  Sickle cell disease with pain crisis: Weaning IV Dilaudid PCA Gabapentin 300 mg every 8 hours Completed IV Toradol Continue home medications as ordered Monitor vital signs closely, reevaluate pain scale regularly, and supplemental oxygen as needed.  Anemia of chronic disease: Today, patient's hemoglobin is 6.5 g/dL.  Transfused 2 units PRBCs.  Maintain hemoglobin above 7.0 g/dL. Follow labs in am  Chronic pain syndrome: Continue home medications  Protein calorie malnutrition: Continue protein supplements.    Code Status: Full Code Family Communication: N/A Disposition Plan: Not yet ready for discharge.  Discharge plan 04/11/2021 [name]  APRN, MSN, FNP-C [facility] [place], [place] [address] [phone]  If 5PM-8AM, please contact night-coverage.  04/10/2021, 11:15 PM  LOS: 13 days

## 2021-04-11 LAB — CBC WITH DIFFERENTIAL/PLATELET
Abs Immature Granulocytes: 0.05 10*3/uL (ref 0.00–0.07)
Basophils Absolute: 0.2 10*3/uL — ABNORMAL HIGH (ref 0.0–0.1)
Basophils Relative: 2 %
Eosinophils Absolute: 1.1 10*3/uL — ABNORMAL HIGH (ref 0.0–0.5)
Eosinophils Relative: 10 %
HCT: 25.7 % — ABNORMAL LOW (ref 36.0–46.0)
Hemoglobin: 8.4 g/dL — ABNORMAL LOW (ref 12.0–15.0)
Immature Granulocytes: 1 %
Lymphocytes Relative: 23 %
Lymphs Abs: 2.6 10*3/uL (ref 0.7–4.0)
MCH: 30.2 pg (ref 26.0–34.0)
MCHC: 32.7 g/dL (ref 30.0–36.0)
MCV: 92.4 fL (ref 80.0–100.0)
Monocytes Absolute: 1.5 10*3/uL — ABNORMAL HIGH (ref 0.1–1.0)
Monocytes Relative: 13 %
Neutro Abs: 5.7 10*3/uL (ref 1.7–7.7)
Neutrophils Relative %: 51 %
Platelets: 550 10*3/uL — ABNORMAL HIGH (ref 150–400)
RBC: 2.78 MIL/uL — ABNORMAL LOW (ref 3.87–5.11)
RDW: 19.4 % — ABNORMAL HIGH (ref 11.5–15.5)
WBC: 11 10*3/uL — ABNORMAL HIGH (ref 4.0–10.5)
nRBC: 0.2 % (ref 0.0–0.2)

## 2021-04-11 LAB — TYPE AND SCREEN
ABO/RH(D): B POS
Antibody Screen: NEGATIVE
Unit division: 0
Unit division: 0

## 2021-04-11 LAB — BPAM RBC
Blood Product Expiration Date: 202212212359
Blood Product Expiration Date: 202212212359
ISSUE DATE / TIME: 202211221616
ISSUE DATE / TIME: 202211222022
Unit Type and Rh: 5100
Unit Type and Rh: 5100

## 2021-04-11 NOTE — Discharge Summary (Signed)
Physician Discharge Summary  Patient ID: [name] MRN: [id number] DOB/AGE: 1971/03/17 50 y.o.  Admit date: 03/27/2021 Discharge date: 04/11/2021  Admission Diagnoses:  Discharge Diagnoses:  Principal Problem:   Sickle cell pain crisis ([place]) Active Problems:   Complicated wound infection   Low hemoglobin   Bilateral leg ulcer (HCC)   Protein-calorie malnutrition, severe   Discharged Condition: fair  Hospital Course: Patient is a 50 year old female with history of sickle cell disease, chronic pain syndrome, opiate dependence, bilateral lower extremity wounds who was initially admitted with sickle cell pain crisis as well as bilateral wound infection.  She was admitted and treated with IV Dilaudid PCA, Toradol and IV fluid.  She has severe protein calorie malnutrition on the bilateral leg ulcers.  Vascular surgery was consulted with debridement done.  Patient also had wound care for continued weight in the hospital.  Pain was controlled with the Dilaudid PCA and transition to oral medications.  Patient however had complicated wound infection that required IV antibiotics with transitioning to oral antibiotics at discharge.  She continued to have severe debility.  Physical therapy and Occupational Therapy consulted.  Patient was still unable to mobilize.  Home health PT and OT was therefore set up.  Patient was given the wheelchair for mobility at home.  Despite that patient continued to have issues with the wound care.  She was not able to bend over to dress her left foot.  Attempt to get patient to skilled facility failed as patient refuses.  Home health has been set up as indicated however could not get an aide at home due to insurance.  Family therefore will have to step up and help patient with wound care.  This was explained to the patient.  She was subsequently discharged home with home health and to follow with PCP and vascular.  Consults: vascular surgery  Significant Diagnostic  Studies: labs: Serial CBCs, CMP's and radiology: CXR: normal, MRI: Of the balls of the feet which showed normal findings, and CT scan: Head CT as well as legs  Treatments: IV hydration, antibiotics: vancomycin and Levaquin, and analgesia: acetaminophen and Dilaudid  Discharge Exam: Blood pressure 114/68, pulse (!) 55, temperature 98.3 F (36.8 C), temperature source Oral, resp. rate 14, height 5\' 7"  (1.702 m), weight 53.9 kg, SpO2 100 %, unknown if currently breastfeeding. General appearance: alert, cooperative, appears stated age, and cachectic Back: symmetric, no curvature. ROM normal. No CVA tenderness. Resp: clear to auscultation bilaterally Cardio: regular rate and rhythm, S1, S2 normal, no murmur, click, rub or gallop GI: soft, non-tender; bowel sounds normal; no masses,  no organomegaly Extremities: Bilateral leg wounds Skin: Skin color, texture, turgor normal. No rashes or lesions or erythema with ulceration of the feet  Disposition: Discharge disposition: 01-Home or Self Care       Discharge Instructions     Diet - low sodium heart healthy   Complete by: As directed    Discharge wound care:   Complete by: As directed    Change dressing every other day   Increase activity slowly   Complete by: As directed       Allergies as of 04/11/2021   No Known Allergies      Medication List     STOP taking these medications    Deferasirox 360 MG Tabs Commonly known as: Jadenu   feeding supplement (PRO-STAT 64) Liqd   predniSONE 5 MG tablet Commonly known as: DELTASONE       TAKE these medications  folic acid 1 MG tablet Commonly known as: FOLVITE Take 5 tablets (5 mg total) by mouth daily. What changed: how much to take   gabapentin 300 MG capsule Commonly known as: NEURONTIN Take 1 capsule (300 mg total) by mouth 3 (three) times daily.   ibuprofen 600 MG tablet Commonly known as: ADVIL TAKE 1 TABLET(600 MG) BY MOUTH EVERY 6 HOURS What changed:  how  much to take how to take this when to take this reasons to take this additional instructions   nutrition supplement (JUVEN) Pack Take 1 packet by mouth 2 (two) times daily between meals.   feeding supplement Liqd Take 237 mLs by mouth 2 (two) times daily between meals.   Oxycodone HCl 10 MG Tabs Take 1 tablet (10 mg total) by mouth every 4 (four) hours while awake. What changed:  when to take this reasons to take this       ASK your doctor about these medications    ciprofloxacin 500 MG tablet Commonly known as: CIPRO Take 1 tablet (500 mg total) by mouth 2 (two) times daily for 9 doses. Ask about: Should I take this medication?               Durable Medical Equipment  (From admission, onward)           Start     Ordered   04/11/21 1213  DME standard manual wheelchair with seat cushion  Once       Comments: Patient suffers from bilateral wound ulcers which impairs their ability to perform daily activities like bathing, dressing, and toileting in the home.  A cane, crutch, or walker will not resolve issue with performing activities of daily living. A wheelchair will allow patient to safely perform daily activities. Patient can safely propel the wheelchair in the home or has a caregiver who can provide assistance. Length of need 12 months . Accessories: elevating leg rests (ELRs), wheel locks, extensions and anti-tippers.   04/11/21 1215   04/06/21 1346  For home use only DME standard manual wheelchair with seat cushion  Once       Comments: Patient suffers from lower extremity wounds which impairs their ability to perform daily activities like ambulation and standingin the home.  A cane, crutch, or walker will not resolve issue with performing activities of daily living. A wheelchair will allow patient to safely perform daily activities. Patient can safely propel the wheelchair in the home or has a caregiver who can provide assistance. Length of need 6 months  . Accessories: elevating leg rests (ELRs), wheel locks, extensions and anti-tippers.   04/06/21 1347              Discharge Care Instructions  (From admission, onward)           Start     Ordered   04/11/21 0000  Discharge wound care:       Comments: Change dressing every other day   04/11/21 1215            Follow-up Information     [name], FNP Follow up on 04/17/2021.   Specialty: Family Medicine Contact information: [address]. [place] [address] [phone]          [facility]              Follow up in 1 week(s).   Contact information: [address] [place] [address] [phone]  [place]. Call.   Why: Call [facility] [place] and ask to speak to your Medicaid Worker about trying to find what in home care resources are available for you. Contact information: [address]  [place], [place]  [address] [phone]        Social Security Administration.. Call.   Why: Call [facility] [place] to find out if you are able to qualify for Medicare and/or disability. Contact information: [place], [place], [place] [address] [phone]  Medicare phone number [phone]                Signed: Barbette Merino 04/11/2021, 12:15 PM  Time spent 37 minutes

## 2021-04-11 NOTE — TOC Initial Note (Addendum)
Transition of Care [facility]) - Initial/Assessment Note    Patient Details  Name: [name] MRN: [id number] Date of Birth: [birth year]  [facility] CM/SW Contact:    Leeroy Cha, RN Phone Number: 04/11/2021, 12:27 PM  Clinical Narrative:                 50 year old female with a medical history significant for sickle cell disease, chronic pain syndrome, opiate dependence and tolerance, and bilateral lower extremity wounds that was admitted for bilateral wound infection in the setting of sickle cell pain crisis.  Patient continues to complain of bilateral lower extremity pain.  Pain is worsened with weightbearing.  Patient has been unable to get up with physical therapy due to increased pain.  Patient will discharge home with a wheelchair.  Home health RN and physical therapy recommended due to impaired mobility.   She denies dizziness, headache, chest pain, urinary symptoms, nausea, vomiting, or diarrhea.  TOC PLAN OF CARE: Orders for hhc rn pt and ot sent to [facility] who is own charity this week due to fact that no other hhc agency will accept her medicaid. [facility] who is charity this week has refused since she does have medicaid but can not accept the medicaid.  Expected Discharge Plan: Home/Self Care Barriers to Discharge: Other (must enter comment)   Patient Goals and CMS Choice Patient states their goals for this hospitalization and ongoing recovery are:: to go homne CMS Medicare.gov Compare Post Acute Care list provided to:: Patient    Expected Discharge Plan and Services Expected Discharge Plan: Home/Self Care   Discharge Planning Services: CM Consult Post Acute Care Choice: Home Health, Durable Medical Equipment Living arrangements for the past 2 months: Single Family Home Expected Discharge Date: 04/11/21               DME Arranged: Wheelchair manual DME Agency: [employer]       HH Arranged: RN, PT, OT HH Agency: [place] [facility]  Agency Contacted: 04/11/21 Time HH Agency Contacted: 50 Representative spoke with at [place] Arrangements/Services Living arrangements for the past 2 months: [place] with:: Spouse Patient language and need for interpreter reviewed:: Yes              Criminal Activity/Legal Involvement Pertinent to Current Situation/Hospitalization: No - Comment as needed  Activities of Daily Living Home Assistive Devices/Equipment: None ADL Screening (condition at time of admission) Patient's cognitive ability adequate to safely complete daily activities?: Yes Is the patient deaf or have difficulty hearing?: No Does the patient have difficulty seeing, even when wearing glasses/contacts?: No Does the patient have difficulty concentrating, remembering, or making decisions?: No Patient able to express need for assistance with ADLs?: No Does the patient have difficulty dressing or bathing?: No Independently performs ADLs?: Yes (appropriate for developmental age) Does the patient have difficulty walking or climbing stairs?: No Weakness of Legs: Both Weakness of Arms/Hands: Both  Permission Sought/Granted                  Emotional Assessment Appearance:: Appears stated age     Orientation: : Oriented to Self, Oriented to Place, Oriented to  Time, Oriented to Situation Alcohol / Substance Use: Not Applicable Psych Involvement: No (comment)  Admission diagnosis:  Infected wound [T14.8XXA, L08.9] Low hemoglobin [D64.9] Bilateral leg ulcer ([place]) [L97.919, L97.929] Sickle cell pain crisis ([place]) [W65.68] Complicated wound infection [T14.8XXA, L08.9] Patient Active Problem List   Diagnosis  Date Noted   Protein-calorie malnutrition, severe 04/06/2021   Bilateral leg ulcer ([place]) 03/28/2021   Low hemoglobin 05/01/2020   At risk for seizures due to oxygen toxicity 02/09/2020   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   Venous stasis 01/19/2020   Partial loss  of skin graft 01/12/2020   Acute respiratory failure with hypoxia ([place]) 12/15/2019   Bone infarction of distal tibia, 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   Sickle cell anemia with crisis ([place]) 09/03/2018   Other chronic pain    Sickle cell crisis ([place]) 06/26/2018   Sickle cell pain crisis ([place]) 04/07/2018   Avascular necrosis of bone of hip, left ([place]) 12/08/2017   Avascular necrosis of bone of hip, right ([place]) 12/08/2017   Hb-SS disease without crisis ([place]) 11/26/2017   Anemia of chronic disease 11/26/2017   Abnormal quad screen    Ringworm of body 04/05/2017   Ulcer of right lower extremity ([place])    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[place]) 03/02/2017   History of pre-eclampsia 12/16/2016   Previous cesarean delivery x 2 07/22/2013   Hemochromatosis 11/10/2012   Chronic pain syndrome 60/02/9322   Complicated wound infection 04/01/2012   Leukocytosis 03/30/2012   PCP:  [name], FNP Pharmacy:   [facility] [place], [place] - [address] [place] [address] Phone: [phone] Fax: [phone]  [id number] [place], [place] - [address] AT ACROSS FROM [address] [place] [address] Phone: [phone] Fax: [phone]     Social Determinants of Health (SDOH) Interventions    Readmission Risk Interventions No flowsheet data found.

## 2021-04-11 NOTE — Progress Notes (Addendum)
Nutrition Follow-up  DOCUMENTATION CODES:  Severe malnutrition in context of chronic illness  INTERVENTION:  Continue regular diet.  Continue Ensure BID.  Continue Juven BID.  Continue MVI with minerals daily.  Continue to encourage PO and supplement intake.  Obtain updated weight.  NUTRITION DIAGNOSIS:  Severe Malnutrition related to chronic illness (sickle cell crises/pain) as evidenced by severe fat depletion, severe muscle depletion. - ongoing  GOAL:  Patient will meet greater than or equal to 90% of their needs - meeting consistently  MONITOR:  PO intake, Supplement acceptance, Labs, Weight trends, Skin, I & O's  REASON FOR ASSESSMENT:  Malnutrition Screening Tool    ASSESSMENT:  50 yo female with a PMH of sickle cell disease, chronic pain syndrome, opiate dependence and tolerance, and bilateral lower extremity wounds that was admitted for bilateral wound infection in the setting of sickle cell pain crisis.  Per Epic, pt with limited PO documentation. Average of 88% consumed over the last 2 documented meals. Pt taking Ensure and Juven consistently.  Admit wt: 55.9 kg Current wt: 53.9 kg  Pt due for new weight tomorrow. RD to order.  Continue current nutrition plan.  Per FNP note, "patient continues to complain of bilateral lower extremity pain.  Pain is worsened with weightbearing.  Patient has been unable to get up with physical therapy due to increased pain.  Patient will discharge home with a wheelchair.  Home health RN and physical therapy recommended due to impaired mobility."  Supplements: Ensure BID, Juven BID  Medications: reviewed; MVI with minerals, oxycodone PO q4 hrs, Senokot BID  Labs: reviewed  Diet Order:   Diet Order             Diet regular Room service appropriate? Yes; Fluid consistency: Thin  Diet effective now                  EDUCATION NEEDS:  Education needs have been addressed  Skin:  Skin Assessment: Skin Integrity  Issues: Skin Integrity Issues:: Other (Comment) Other: Bilateral venous ulcers on L & R ankles  Last BM:  04/08/21 - Type 1, large  Height:  Ht Readings from Last 1 Encounters:  04/01/21 5\' 7"  (1.702 m)   Weight:  Wt Readings from Last 1 Encounters:  04/06/21 53.9 kg   BMI:  Body mass index is 18.61 kg/m.  Estimated Nutritional Needs:  Kcal:  2050-2250 Protein:  80-95 grams Fluid:  >2 L  Derrel Nip, RD, LDN (she/her/hers) Clinical Inpatient Dietitian RD Pager/After-Hours/Weekend Pager # in [place]

## 2021-04-11 NOTE — TOC Transition Note (Addendum)
Transition of Care [facility]) - CM/SW Discharge Note   Patient Details  Name: Caddie Randle MRN: [id number] Date of Birth: 1970/07/08  Transition of Care [facility]) CM/SW Contact:  Leeroy Cha, RN Phone Number: 04/11/2021, 3:46 PM   Clinical Narrative:    Spoke at length to the patient who is not willing to help herself. / friend is taking care of her children that range in age 49-3.  Spoke with her uncle on the phone.  She has no way home.  Wants to take an ambulance home.  Patient can ambulate on her feet ulcers are to the ankle areas on both limbs.  The one one the right is old and a chronic wound.  The ones on the left do not involve the fett only the ankles.  She is not willing to ask friends or family for help . She is medical cleared to return home and is fully aware of this.Spoke to patient for over 30 minutes without resolution. Will call ptar for transport home. Ptar called at [phone].  Final next level of care: Home/Self Care Barriers to Discharge: No [place] will accept this patient   Patient Goals and CMS Choice Patient states their goals for this hospitalization and ongoing recovery are:: to go homne CMS Medicare.gov Compare Post Acute Care list provided to:: Patient    Discharge Placement                       Discharge Plan and Services   Discharge Planning Services: CM Consult Post Acute Care Choice: Home Health, Durable Medical Equipment          DME Arranged: Wheelchair manual DME Agency: [employer]       HH Arranged: RN, PT, OT HH Agency: [place] Date [facility] Agency Contacted: 04/11/21 Time [place]: 60 Representative spoke with at [place]: corey  Social Determinants of Health ([place]) Interventions     Readmission Risk Interventions No flowsheet data found.

## 2021-04-11 NOTE — Progress Notes (Addendum)
Patient left via [facility]AR. Medicated for pain prior to transport. Escorted by [employer] staff. Vital signs stable. Patient alert and oriented and tolerated transfer well. IV's removed prior to shift.

## 2021-04-11 NOTE — Progress Notes (Signed)
Physical Therapy Treatment Patient Details Name: [name] MRN: [id number] DOB: 1971-02-24 Today's Date: 04/11/2021   History of Present Illness 50 yo female admitted with sickle cell crisis with L LE pain worse than R LE. Hx of sickle cell, venous stasis ulcer, chronic pain, LE wounds    PT Comments    General Comments: AxO x 3 soft spoken pleasant [facility] transfer comment: pt was self able to rise and transfer from EOB to [facility] then from [facility] to recliner with much dependancy on B UE's.  Pt also able to perform self peri care in sitting position. General Gait Details: Transfers only due to B LE pain effort needed to just perform transfers.   Recommendations for follow up therapy are one component of a multi-disciplinary discharge planning process, led by the attending physician.  Recommendations may be updated based on patient status, additional functional criteria and insurance authorization.  Follow Up Recommendations  Home health PT     Assistance Recommended at Discharge Frequent or constant Supervision/Assistance  Equipment Recommendations  None recommended by PT    Recommendations for Other Services       Precautions / Restrictions Precautions Precautions: Fall Precaution Comments: L foot/ankle pain worse than R Restrictions Other Position/Activity Restrictions: pt uses a wheelchair for mobility     Mobility  Bed Mobility Overal bed mobility: Modified Independent             General bed mobility comments: pt self able to transfer to EOB with increased time    Transfers Overall transfer level: Needs assistance Equipment used: None Transfers: Bed to chair/wheelchair/BSC   Stand pivot transfers: Supervision         General transfer comment: pt was self able to rise and transfer from EOB to [facility] then from [facility] to recliner with much dependancy on B UE's.  Pt also able to perform self peri care in sitting position.    Ambulation/Gait                General Gait Details: Transfers only due to B LE pain effort needed to just perform transfers.   Stairs             Wheelchair Mobility    Modified Rankin (Stroke Patients Only)       Balance                                            Cognition Arousal/Alertness: Awake/alert Behavior During Therapy: WFL for tasks assessed/performed Overall Cognitive Status: Within Functional Limits for tasks assessed                                 General Comments: AxO x 3 soft spoken pleasant [facility]        Exercises      General Comments        Pertinent Vitals/Pain Pain Assessment: Faces Faces Pain Scale: Hurts little more Pain Location: L ankle/foot/LE Pain Descriptors / Indicators: Grimacing;Guarding;Moaning Pain Intervention(s): Monitored during session;Repositioned    Home Living                          Prior Function            PT Goals (current goals can now be found in the care plan section)  Progress towards PT goals: Progressing toward goals    Frequency           PT Plan Current plan remains appropriate    Co-evaluation              AM-PAC PT "6 Clicks" Mobility   Outcome Measure  Help needed turning from your back to your side while in a flat bed without using bedrails?: None Help needed moving from lying on your back to sitting on the side of a flat bed without using bedrails?: None Help needed moving to and from a bed to a chair (including a wheelchair)?: A Little Help needed standing up from a chair using your arms (e.g., wheelchair or bedside chair)?: A Little Help needed to walk in hospital room?: A Little Help needed climbing 3-5 steps with a railing? : A Little 6 Click Score: 20    End of Session Equipment Utilized During Treatment: Gait belt Activity Tolerance: Patient limited by pain Patient left: with call bell/phone within reach;in chair Nurse Communication: Mobility  status PT Visit Diagnosis: Pain;Other abnormalities of gait and mobility (R26.89);Difficulty in walking, not elsewhere classified (R26.2) Pain - Right/Left: Left Pain - part of body: Ankle and joints of foot;Leg     Time: 3212-2482 PT Time Calculation (min) (ACUTE ONLY): 26 min  Charges:  $Therapeutic Activity: 23-37 mins                    Rica Koyanagi  PTA Acute  Rehabilitation Services Pager      [phone] Office      [phone]

## 2021-04-17 ENCOUNTER — Ambulatory Visit (INDEPENDENT_AMBULATORY_CARE_PROVIDER_SITE_OTHER): Payer: Medicaid Other | Admitting: Family Medicine

## 2021-04-17 ENCOUNTER — Encounter: Payer: Self-pay | Admitting: Family Medicine

## 2021-04-17 ENCOUNTER — Other Ambulatory Visit: Payer: Self-pay

## 2021-04-17 ENCOUNTER — Encounter (HOSPITAL_BASED_OUTPATIENT_CLINIC_OR_DEPARTMENT_OTHER): Payer: Medicaid Other | Attending: Internal Medicine | Admitting: Internal Medicine

## 2021-04-17 VITALS — BP 122/66 | HR 61 | Temp 97.2°F | Ht 67.0 in

## 2021-04-17 DIAGNOSIS — Z741 Need for assistance with personal care: Secondary | ICD-10-CM

## 2021-04-17 DIAGNOSIS — L97828 Non-pressure chronic ulcer of other part of left lower leg with other specified severity: Secondary | ICD-10-CM | POA: Diagnosis present

## 2021-04-17 DIAGNOSIS — Z7409 Other reduced mobility: Secondary | ICD-10-CM

## 2021-04-17 DIAGNOSIS — Z993 Dependence on wheelchair: Secondary | ICD-10-CM | POA: Diagnosis not present

## 2021-04-17 DIAGNOSIS — D571 Sickle-cell disease without crisis: Secondary | ICD-10-CM | POA: Diagnosis not present

## 2021-04-17 DIAGNOSIS — L97912 Non-pressure chronic ulcer of unspecified part of right lower leg with fat layer exposed: Secondary | ICD-10-CM | POA: Diagnosis not present

## 2021-04-17 DIAGNOSIS — L97818 Non-pressure chronic ulcer of other part of right lower leg with other specified severity: Secondary | ICD-10-CM | POA: Diagnosis not present

## 2021-04-17 DIAGNOSIS — Z789 Other specified health status: Secondary | ICD-10-CM | POA: Diagnosis not present

## 2021-04-17 DIAGNOSIS — L97922 Non-pressure chronic ulcer of unspecified part of left lower leg with fat layer exposed: Secondary | ICD-10-CM

## 2021-04-17 NOTE — Progress Notes (Signed)
Patient [name]    Subjective:  Patient ID: [name], female    DOB: [birth year]  Age: 50 y.o. MRN: [id number]  CC:  Chief Complaint  Patient presents with   Follow-up    FU visit pt has no issues at the moment.pt would like to know about getting a electric wheelchair    HPI [name] is a 50 year old female with a medical history significant for sickle cell disease, chronic pain, opiate dependence and tolerance that  Presents accompanied by caregiver for a post hospital follow-up.  Patient was hospitalized from 03/27/2021 - 04/11/2021 for a nonhealing bilateral lower extremity ulcers.  Patient has been suffering with bilateral lower extremity ulcers over the past several years.  She was previously a patient of [facility] and has undergone several surgeries including skin graft for nonhealing ulcers.  Patient states that prior to hospitalization, she discontinued her wound care appointments.  She states that she was no longer interested in going to frequent appointments due to the fact that ulcers were not responding.  Prior to hospitalization, patient was wrapping wounds daily and noticed that there was a putrid odor and increased drainage.  She states that upon standing prior to admission, left lower extremity ankle ulcer began bleeding profusely.  Patient then called ambulance for hospital admission where she stayed for 15 days on IV antibiotics.  Patient is unable to apply full weight to bilateral lower extremities.  She was discharged home with a manual wheelchair.  Patient is having difficulty operating a manual wheelchair.  Patient was referred to wound care and has first appointment today.  Patient has been wrapping wounds every other day.  While in hospital, patient had a left lower extremity bleed that required vein and vascular assistance.  There was a retrograde in nature side branch of the posterior tibial artery that required  hemostasis with direct pressure on the pulsatile area and Prolene stitch in the area.  There was no further bleeding following procedure.  Patient completed 14 days of antibiotic therapy.  Today, patient is complaining of pain to bilateral lower extremities.  Pain intensity is rated 4/10.  Patient last had oxycodone this a.m. with moderate relief.  She denies any headache, fever, chills, shortness of breath, or urinary symptoms. The patient has a history of anemia of chronic disease.  Throughout admission, hemoglobin remained less than baseline.  Goal is to maintain patient hemoglobin above 8.0 to promote lower extremity healing.  Patient received a total of 4 units of PRBCs while hospitalized.  Past Medical History:  Diagnosis Date   Anemia 03/30/2012   Hx of sickle cell disease   CAP (community acquired pneumonia)    2014   Cholelithiasis 03/30/2012   Chronic wound of extremity 04/01/2012   Elevated LFTs    Leg ulcer ([place]) 10/27/2012   Chronic under care of wound clinic   Multiple open wounds of lower extremity    chronic wounds B/LLE   Sickle cell disease ([place])    Sinus bradycardia by electrocardiogram 04/03/2012    Past Surgical History:  Procedure Laterality Date   ALLOGRAFT APPLICATION Right 08/26/8117   Procedure: SURGICAL PREP FOR GRAFTING RIGHT LOWER EXTREMITY AND APPLICATION OF Jannifer Hick;  Surgeon: Irene Limbo, MD;  Location: [facility];  Service: Plastics;  Laterality: Right;   APPLICATION OF A-CELL OF EXTREMITY Right 10/09/2015   Procedure: APPLICATION OF Jannifer Hick;  Surgeon: Irene Limbo, MD;  Location: [facility];  Service: Clinical cytogeneticist;  Laterality: Right;   BREAST SURGERY     left breast cyst aspiration   CESAREAN SECTION     CESAREAN SECTION N/A 01/06/2014   Procedure: CESAREAN SECTION;  Surgeon: Emily Filbert, MD;  Location: [facility];  Service: Obstetrics;  Laterality: N/A;   CESAREAN SECTION N/A 05/04/2017   Procedure: CESAREAN SECTION;   Surgeon: [place] Mode, MD;  Location: [facility];  Service: Obstetrics;  Laterality: N/A;   I & D EXTREMITY Right 10/09/2015   Procedure: SURGICAL PREPARATION FOR GRAFTING RIGHT ANKLE AND APPLICATION THERASKIN;  Surgeon: Irene Limbo, MD;  Location: [facility];  Service: Plastics;  Laterality: Right;   SKIN FULL THICKNESS GRAFT Bilateral 06/17/2016   Procedure: SURGICAL PREP FOR GRAFTING, BILATERAL LOWER EXTREMITIES AND APPLICATION OF Jannifer Hick;  Surgeon: Irene Limbo, MD;  Location: [facility];  Service: Plastics;  Laterality: Bilateral;   TONSILLECTOMY      Family History  Problem Relation Age of Onset   Sickle cell anemia Sister    Diabetes Mother    Asthma Mother    Sickle cell trait Mother    Sickle cell trait Father    Hypertension Father     Social History   Socioeconomic History   Marital status: Married    Spouse name: [name]   Number of children: 1   Years of education: Not on file   Highest education level: Not on file  Occupational History   Occupation: Employed in home care.  Tobacco Use   Smoking status: Never   Smokeless tobacco: Never  Vaping Use   Vaping Use: Never used  Substance and Sexual Activity   Alcohol use: No   Drug use: No   Sexual activity: Yes    Birth control/protection: None  Other Topics Concern   Not on file  Social History Narrative   Lives with husband.   Social Determinants of Health   Financial Resource Strain: Not on file  Food Insecurity: Not on file  Transportation Needs: Not on file  Physical Activity: Not on file  Stress: Not on file  Social Connections: Not on file  Intimate Partner Violence: Not on file    Outpatient Medications Prior to Visit  Medication Sig Dispense Refill   folic acid (FOLVITE) 1 MG tablet Take 5 tablets (5 mg total) by mouth daily. (Patient taking differently: Take 1 mg by mouth daily.) 150 tablet 11   gabapentin (NEURONTIN) 300 MG capsule Take 1 capsule  (300 mg total) by mouth 3 (three) times daily. 90 capsule 2   ibuprofen (ADVIL) 600 MG tablet TAKE 1 TABLET(600 MG) BY MOUTH EVERY 6 HOURS (Patient taking differently: Take 600 mg by mouth every 6 (six) hours as needed for mild pain.) 60 tablet 2   oxyCODONE 10 MG TABS Take 1 tablet (10 mg total) by mouth every 4 (four) hours while awake. 90 tablet 0   feeding supplement (ENSURE ENLIVE / ENSURE PLUS) LIQD Take 237 mLs by mouth 2 (two) times daily between meals. (Patient not taking: Reported on 04/17/2021) 237 mL 12   nutrition supplement, JUVEN, (JUVEN) PACK Take 1 packet by mouth 2 (two) times daily between meals. (Patient not taking: Reported on 04/17/2021) 30 each 5   No facility-administered medications prior to visit.    No Known Allergies  ROS Review of Systems  Constitutional: Negative.  Negative for fatigue.  HENT: Negative.    Eyes: Negative.   Respiratory: Negative.    Genitourinary: Negative.  Musculoskeletal:  Positive for arthralgias and back pain.  Hematological: Negative.   Psychiatric/Behavioral: Negative.       Objective:    Physical Exam Constitutional:      Appearance: Normal appearance.  HENT:     Mouth/Throat:     Mouth: Mucous membranes are moist.     Pharynx: Oropharynx is clear.  Eyes:     General: No scleral icterus.    Pupils: Pupils are equal, round, and reactive to light.  Cardiovascular:     Rate and Rhythm: Normal rate.     Pulses: Normal pulses.  Pulmonary:     Effort: Pulmonary effort is normal.  Abdominal:     General: Bowel sounds are normal.  Neurological:     Mental Status: She is alert.     Cranial Nerves: No cranial nerve deficit.     Motor: No weakness.     Gait: Gait normal.  Psychiatric:        Mood and Affect: Mood normal.        Thought Content: Thought content normal.        Judgment: Judgment normal.    BP 122/66 (BP Location: Right Arm, Patient Position: Sitting)   Pulse 61   Temp (!) 97.2 F (36.2 C)   SpO2 100%   Wt Readings from Last 3 Encounters:  04/06/21 118 lb 13.3 oz (53.9 kg)  06/08/19 148 lb (67.1 kg)  03/02/19 136 lb (61.7 kg)     Health Maintenance Due  Topic Date Due   COVID-19 Vaccine (1) Never done   COLONOSCOPY (Pts 45-55yr Insurance coverage will need to be confirmed)  Never done   Pneumococcal Vaccine [id number] old (3 - PPSV23 if available, else PCV20) 03/25/2019   PAP SMEAR-Modifier  12/17/2019    There are no preventive care reminders to display for this patient.  Lab Results  Component Value Date   TSH 0.099 (L) 03/20/2017   Lab Results  Component Value Date   WBC 11.0 (H) 04/11/2021   HGB 8.4 (L) 04/11/2021   HCT 25.7 (L) 04/11/2021   MCV 92.4 04/11/2021   PLT 550 (H) 04/11/2021   Lab Results  Component Value Date   NA 140 04/09/2021   K 3.7 04/09/2021   CHLORIDE 107 01/20/2014   CO2 30 04/09/2021   GLUCOSE 104 (H) 04/09/2021   BUN 12 04/09/2021   CREATININE 0.42 (L) 04/09/2021   BILITOT 0.8 04/09/2021   ALKPHOS 42 04/09/2021   AST 28 04/09/2021   ALT 17 04/09/2021   PROT 6.3 (L) 04/09/2021   ALBUMIN 3.1 (L) 04/09/2021   CALCIUM 8.9 04/09/2021   ANIONGAP 7 04/09/2021   EGFR 113 11/08/2020   Lab Results  Component Value Date   CHOL 143 11/25/2014   Lab Results  Component Value Date   HDL 27 (L) 11/25/2014   Lab Results  Component Value Date   LDLCALC 89 11/25/2014   Lab Results  Component Value Date   TRIG 137 11/25/2014   Lab Results  Component Value Date   CHOLHDL 5.3 11/25/2014   Lab Results  Component Value Date   HGBA1C 4.4 (L) 03/20/2017      Assessment & Plan:   Problem List Items Addressed This Visit       Other   Hb-SS disease without crisis ([facility]   Relevant Orders   Sickle Cell Panel (Completed)   Bilateral leg ulcer ([facility]   Other Visit Diagnoses     Poor tolerance for ambulation  Impaired mobility and personal care          1. Hb-SS disease without crisis ([facility] [facility]) Will maintain  hemoglobin above 8.0 g/dL.  Patient will return to clinic in 2 weeks for hemoglobin and possible exchange transfusion. Also, recommended [facility] for this patient - Sickle Cell Panel  2. Ulcers of both lower extremities with fat layer exposed [facility]) Patient has first appointment with wound care later this morning.  Will defer to wound care specialist for further work-up and evaluation.  3. Poor tolerance for ambulation Continue utilizing wheelchair.  Patient warrants personal care aide for light cleaning and assistance with medication management.  4. Impaired mobility and personal care Patient is unable to bear weight on bilateral lower extremities.  Continue wheelchair use.    Follow-up: Return in about 3 months (around 07/17/2021).     [name]  APRN, MSN, FNP-C Patient [facility] [address] [place], [place] [address] [phone]

## 2021-04-17 NOTE — Progress Notes (Signed)
EBONI, COVAL ([id number]) Visit Report for 04/17/2021 Abuse/Suicide Risk Screen Details Patient Name: Date of Service: [name] [name] 04/17/2021 10:30 A M Medical Record Number: [id number] Patient Account Number: [id number] Date of Birth/Sex: Treating RN: 12-Sep-1970 (50 y.o. Sue Lush Primary Care Kamri Gotsch: [name] Other Clinician: Referring Arles Rumbold: Treating Ladasha Schnackenberg/Extender: Farrel Demark Weeks in Treatment: 0 Abuse/Suicide Risk Screen Items Answer ABUSE RISK SCREEN: Has anyone close to you tried to hurt or harm you recentlyo No Do you feel uncomfortable with anyone in your familyo No Has anyone forced you do things that you didnt want to doo No Electronic Signature(s) Signed: 04/17/2021 5:43:30 PM By: Lorrin Jackson Entered By: Lorrin Jackson on 04/17/2021 10:59:13 -------------------------------------------------------------------------------- Activities of Daily Living Details Patient Name: Date of Service: [name] [name] 04/17/2021 10:30 A M Medical Record Number: [id number] Patient Account Number: [id number] Date of Birth/Sex: Treating RN: 01-Nov-1970 (50 y.o. Sue Lush Primary Care Gideon Burstein: [name] Other Clinician: Referring Kacey Vicuna: Treating Jacolyn Joaquin/Extender: Farrel Demark Weeks in Treatment: 0 Activities of Daily Living Items Answer Activities of Daily Living (Please select one for each item) Drive Automobile Not Able T Medications ake Completely Able Use T elephone Completely Able Care for Appearance Need Assistance Use T oilet Completely Able Bath / Shower Need Assistance Dress Self Need Assistance Feed Self Completely Able Walk Need Assistance Get In / Out Bed Need Assistance Housework Need Assistance Prepare Meals Need Assistance Handle Money Need Assistance Shop for Self Completely Able Electronic Signature(s) Signed: 04/17/2021 5:43:30 PM By: Lorrin Jackson Entered By:  Lorrin Jackson on 04/17/2021 11:00:04 -------------------------------------------------------------------------------- Education Screening Details Patient Name: Date of Service: [name], [name] 04/17/2021 10:30 A M Medical Record Number: [id number] Patient Account Number: [id number] Date of Birth/Sex: Treating RN: 05/18/1971 (50 y.o. Sue Lush Primary Care Vaudine Dutan: [name] Other Clinician: Referring Andren Bethea: Treating Merari Pion/Extender: Bernette Redbird in Treatment: 0 Primary Learner Assessed: Patient Learning Preferences/Education Level/Primary Language Learning Preference: Explanation, Demonstration, Printed Material Highest Education Level: High School Preferred Language: English Cognitive Barrier Language Barrier: No Translator Needed: No Memory Deficit: No Emotional Barrier: No Cultural/Religious Beliefs Affecting Medical Care: No Physical Barrier Impaired Vision: No Impaired Hearing: No Decreased Hand dexterity: No Knowledge/Comprehension Knowledge Level: High Comprehension Level: High Ability to understand written instructions: High Ability to understand verbal instructions: High Motivation Anxiety Level: Calm Cooperation: Cooperative Education Importance: Acknowledges Need Interest in Health Problems: Asks Questions Perception: Coherent Willingness to Engage in Self-Management High Activities: Readiness to Engage in Self-Management High Activities: Electronic Signature(s) Signed: 04/17/2021 5:43:30 PM By: Lorrin Jackson Entered By: Lorrin Jackson on 04/17/2021 11:00:39 -------------------------------------------------------------------------------- Fall Risk Assessment Details Patient Name: Date of Service: [name] UMA, [name] 04/17/2021 10:30 A M Medical Record Number: [id number] Patient Account Number: [id number] Date of Birth/Sex: Treating RN: 1970-06-10 (50 y.o. Sue Lush Primary Care Geovana Gebel: [name] Other Clinician: Referring Karter Haire: Treating Carley Strickling/Extender: Farrel Demark Weeks in Treatment: 0 Fall Risk Assessment Items Have you had 2 or more falls in the last 12 monthso 0 No Have you had any fall that resulted in injury in the last 12 monthso 0 No FALLS RISK SCREEN History of falling - immediate or within 3 months 0 No Secondary diagnosis (Do you have 2 or more medical diagnoseso) 15 Yes Ambulatory aid None/bed rest/wheelchair/nurse 0 Yes Crutches/cane/walker 0 No Furniture 0 No Intravenous therapy Access/Saline/Heparin Lock 0 No Gait/Transferring Normal/ bed rest/ wheelchair 0 Yes Weak (short steps  with or without shuffle, stooped but able to lift head while walking, may seek 0 No support from furniture) Impaired (short steps with shuffle, may have difficulty arising from chair, head down, impaired 0 No balance) Mental Status Oriented to own ability 0 Yes Electronic Signature(s) Signed: 04/17/2021 5:43:30 PM By: Lorrin Jackson Entered By: Lorrin Jackson on 04/17/2021 11:00:55 -------------------------------------------------------------------------------- Foot Assessment Details Patient Name: Date of Service: [name], [name] 04/17/2021 10:30 A M Medical Record Number: [id number] Patient Account Number: [id number] Date of Birth/Sex: Treating RN: [name], 1970 (50 y.o. Sue Lush Primary Care Cheyene Hamric: [name] Other Clinician: Referring Jacen Carlini: Treating Karla Pavone/Extender: Farrel Demark Weeks in Treatment: 0 Foot Assessment Items Site Locations + = Sensation present, - = Sensation absent, C = Callus, U = Ulcer R = Redness, W = Warmth, M = Maceration, PU = Pre-ulcerative lesion F = Fissure, S = Swelling, D = Dryness Assessment Right: Left: Other Deformity: No No Prior Foot Ulcer: No No Prior Amputation: No No Charcot Joint: No No Ambulatory Status: Ambulatory With Help Assistance Device:  Wheelchair Gait: Steady Electronic Signature(s) Signed: 04/17/2021 5:43:30 PM By: Lorrin Jackson Entered By: Lorrin Jackson on 04/17/2021 11:01:54 -------------------------------------------------------------------------------- Nutrition Risk Screening Details Patient Name: Date of Service: [name] [name] 04/17/2021 10:30 A M Medical Record Number: [id number] Patient Account Number: [id number] Date of Birth/Sex: Treating RN: 23-Sep-1970 (50 y.o. Sue Lush Primary Care Teal Bontrager: [name] Other Clinician: Referring Chesnie Capell: Treating Saben Donigan/Extender: Farrel Demark Weeks in Treatment: 0 Height (in): 67 Weight (lbs): Body Mass Index (BMI): Nutrition Risk Screening Items Score Screening NUTRITION RISK SCREEN: I have an illness or condition that made me change the kind and/or amount of food I eat 0 No I eat fewer than two meals per day 0 No I eat few fruits and vegetables, or milk products 0 No I have three or more drinks of beer, liquor or wine almost every day 0 No I have tooth or mouth problems that make it hard for me to eat 0 No I don't always have enough money to buy the food I need 0 No I eat alone most of the time 0 No I take three or more different prescribed or over-the-counter drugs a day 1 Yes Without wanting to, I have lost or gained 10 pounds in the last six months 0 No I am not always physically able to shop, cook and/or feed myself 0 No Nutrition Protocols Good Risk Protocol 0 No interventions needed Moderate Risk Protocol High Risk Proctocol Risk Level: Good Risk Score: 1 Electronic Signature(s) Signed: 04/17/2021 5:43:30 PM By: Lorrin Jackson Entered By: Lorrin Jackson on 04/17/2021 11:01:09

## 2021-04-17 NOTE — Progress Notes (Signed)
ASHANNA, HEINSOHN ([id number]) Visit Report for 04/17/2021 Allergy List Details Patient Name: Date of Service: [name] 04/17/2021 10:30 A M Medical Record Number: [id number] Patient Account Number: [id number] Date of Birth/Sex: Treating RN: [birth year] (50 y.o. Sue Lush Primary Care Jovonte Commins: [name] Other Clinician: Referring Irish Piech: Treating Jaree Trinka/Extender: Farrel Demark Weeks in Treatment: 0 Allergies Active Allergies No Known Allergies Allergy Notes Electronic Signature(s) Signed: 04/17/2021 5:43:30 PM By: Lorrin Jackson Entered By: Lorrin Jackson on 04/17/2021 10:57:20 -------------------------------------------------------------------------------- Arrival Information Details Patient Name: Date of Service: [name], [name] 04/17/2021 10:30 A M Medical Record Number: [id number] Patient Account Number: [id number] Date of Birth/Sex: Treating RN: [birth year] (50 y.o. Sue Lush Primary Care Yohanna Tow: [name] Other Clinician: Referring Abigail Marsiglia: Treating Saban Heinlen/Extender: Bernette Redbird in Treatment: 0 Visit Information Patient Arrived: Wheel Chair Arrival Time: 10:53 Accompanied By: friend Transfer Assistance: Manual Patient Identification Verified: Yes Secondary Verification Process Completed: Yes Patient Requires Transmission-Based Precautions: No Patient Has Alerts: No History Since Last Visit Added or deleted any medications: No Any new allergies or adverse reactions: No Had a fall or experienced change in activities of daily living that may affect risk of falls: No Signs or symptoms of abuse/neglect since last visito No Hospitalized since last visit: No Implantable device outside of the clinic excluding cellular tissue based products placed in the center since last visit: No Has Dressing in Place as Prescribed: Yes Pain Present Now: Yes Electronic Signature(s) Signed: 04/17/2021  5:43:30 PM By: Lorrin Jackson Entered By: Lorrin Jackson on 04/17/2021 10:54:26 -------------------------------------------------------------------------------- Clinic Level of Care Assessment Details Patient Name: Date of Service: [name] 04/17/2021 10:30 A M Medical Record Number: [id number] Patient Account Number: [id number] Date of Birth/Sex: Treating RN: 1970/12/20 (50 y.o. Sue Lush Primary Care Sheng Pritz: [name] Other Clinician: Referring Annalia Metzger: Treating Gerilyn Stargell/Extender: Farrel Demark Weeks in Treatment: 0 Clinic Level of Care Assessment Items TOOL 2 Quantity Score X- 1 0 Use when only an EandM is performed on the INITIAL visit ASSESSMENTS - Nursing Assessment / Reassessment X- 1 20 General Physical Exam (combine w/ comprehensive assessment (listed just below) when performed on new pt. evals) X- 1 25 Comprehensive Assessment (HX, ROS, Risk Assessments, Wounds Hx, etc.) ASSESSMENTS - Wound and Skin A ssessment / Reassessment []  - 0 Simple Wound Assessment / Reassessment - one wound X- 4 5 Complex Wound Assessment / Reassessment - multiple wounds []  - 0 Dermatologic / Skin Assessment (not related to wound area) ASSESSMENTS - Ostomy and/or Continence Assessment and Care []  - 0 Incontinence Assessment and Management []  - 0 Ostomy Care Assessment and Management (repouching, etc.) PROCESS - Coordination of Care []  - 0 Simple Patient / Family Education for ongoing care X- 1 20 Complex (extensive) Patient / Family Education for ongoing care []  - 0 Staff obtains Programmer, systems, Records, T Results / Process Orders est []  - 0 Staff telephones HHA, Nursing Homes / Clarify orders / etc []  - 0 Routine Transfer to another Facility (non-emergent condition) []  - 0 Routine Hospital Admission (non-emergent condition) []  - 0 New Admissions / Biomedical engineer / Ordering NPWT Apligraf, etc. , []  - 0 Emergency Hospital Admission  (emergent condition) []  - 0 Simple Discharge Coordination []  - 0 Complex (extensive) Discharge Coordination PROCESS - Special Needs []  - 0 Pediatric / Minor Patient Management []  - 0 Isolation Patient Management []  - 0 Hearing / Language / Visual special needs []  - 0 Assessment of  Community assistance (transportation, D/C planning, etc.) []  - 0 Additional assistance / Altered mentation []  - 0 Support Surface(s) Assessment (bed, cushion, seat, etc.) INTERVENTIONS - Wound Cleansing / Measurement X- 1 5 Wound Imaging (photographs - any number of wounds) []  - 0 Wound Tracing (instead of photographs) []  - 0 Simple Wound Measurement - one wound X- 4 5 Complex Wound Measurement - multiple wounds []  - 0 Simple Wound Cleansing - one wound X- 4 5 Complex Wound Cleansing - multiple wounds INTERVENTIONS - Wound Dressings []  - 0 Small Wound Dressing one or multiple wounds []  - 0 Medium Wound Dressing one or multiple wounds X- 2 20 Large Wound Dressing one or multiple wounds []  - 0 Application of Medications - injection INTERVENTIONS - Miscellaneous []  - 0 External ear exam []  - 0 Specimen Collection (cultures, biopsies, blood, body fluids, etc.) []  - 0 Specimen(s) / Culture(s) sent or taken to Lab for analysis []  - 0 Patient Transfer (multiple staff / Civil Service fast streamer / Similar devices) []  - 0 Simple Staple / Suture removal (25 or less) []  - 0 Complex Staple / Suture removal (26 or more) []  - 0 Hypo / Hyperglycemic Management (close monitor of Blood Glucose) []  - 0 Ankle / Brachial Index ([name]) - do not check if billed separately Has the patient been seen at the hospital within the last three years: Yes Total Score: 170 Level Of Care: New/Established - Level 5 Electronic Signature(s) Signed: 04/17/2021 5:43:30 PM By: Lorrin Jackson Entered By: Lorrin Jackson on 04/17/2021 12:02:20 -------------------------------------------------------------------------------- Encounter  Discharge Information Details Patient Name: Date of Service: [name], [name] 04/17/2021 10:30 A M Medical Record Number: [id number] Patient Account Number: [id number] Date of Birth/Sex: Treating RN: Jul 18, 1970 (50 y.o. Sue Lush Primary Care Deetya Drouillard: [name] Other Clinician: Referring Wava Kildow: Treating Reginae Wolfrey/Extender: Bernette Redbird in Treatment: 0 Encounter Discharge Information Items Discharge Condition: Stable Ambulatory Status: Wheelchair Discharge Destination: Home Transportation: Private Auto Accompanied By: friend Schedule Follow-up Appointment: Yes Clinical Summary of Care: Provided on 04/17/2021 Form Type Recipient Paper Patient Patient Electronic Signature(s) Signed: 04/17/2021 5:43:30 PM By: Lorrin Jackson Entered By: Lorrin Jackson on 04/17/2021 12:03:24 -------------------------------------------------------------------------------- Lower Extremity Assessment Details Patient Name: Date of Service: [name], [name] 04/17/2021 10:30 A M Medical Record Number: [id number] Patient Account Number: [id number] Date of Birth/Sex: Treating RN: 12-05-1970 (50 y.o. Sue Lush Primary Care Nai Dasch: [name] Other Clinician: Referring Ashlynn Gunnels: Treating Deshay Blumenfeld/Extender: Farrel Demark Weeks in Treatment: 0 Edema Assessment Assessed: Shirlyn Goltz: Yes] [Right: Yes] Edema: [Left: No] [Right: No] Calf Left: Right: Point of Measurement: 33 cm From Medial Instep 24 cm 25 cm Ankle Left: Right: Point of Measurement: 10 cm From Medial Instep 17.4 cm 20.4 cm Vascular Assessment Pulses: Dorsalis Pedis Palpable: [Left:Yes] [Right:Yes] Electronic Signature(s) Signed: 04/17/2021 5:43:30 PM By: Lorrin Jackson Entered By: Lorrin Jackson on 04/17/2021 11:26:36 -------------------------------------------------------------------------------- Multi Wound Chart Details Patient Name: Date of Service: [name], [name] 04/17/2021 10:30 A M Medical Record Number: [id number] Patient Account Number: [id number] Date of Birth/Sex: Treating RN: 1970-06-16 (50 y.o. F) Primary Care Pearley Millington: [name] Other Clinician: Referring Elverna Caffee: Treating Herb Beltre/Extender: Farrel Demark Weeks in Treatment: 0 Vital Signs Height(in): 67 Pulse(bpm): 65 Weight(lbs): Blood Pressure(mmHg): 150/82 Body Mass Index(BMI): Temperature(F): 97.8 Respiratory Rate(breaths/min): 16 Photos: [17:Right, Circumferential Lower Leg] [18:Left, Posterior Ankle] [19:Left, Medial Ankle] Wound Location: [17:Gradually Appeared] [18:Gradually Appeared] [19:Gradually Appeared] Wounding Event: [17:Sickle Cell Lesion] [18:Sickle Cell Lesion] [19:Sickle Cell Lesion] Primary  Etiology: [17:Anemia, Sickle Cell Disease,] [18:Anemia, Sickle Cell Disease,] [19:Anemia, Sickle Cell Disease,] Comorbid History: [17:Neuropathy 10/05/2012] [18:Neuropathy 12/25/2020] [19:Neuropathy 12/25/2020] Date Acquired: [17:0] [18:0] [19:0] Weeks of Treatment: [17:Open] [18:Open] [19:Open] Wound Status: [17:Yes] [18:No] [19:No] Clustered Wound: [17:16.5x14.5x0.1] [18:6x3x0.3] [19:6x4.5x0.7] Measurements L x W x D (cm) [17:187.907] [18:14.137] [19:21.206] A (cm) : rea [17:18.791] [18:4.241] [19:14.844] Volume (cm) : [17:0.00%] [18:0.00%] [19:0.00%] % Reduction in Area: [17:0.00%] [18:0.00%] [19:0.00%] % Reduction in Volume: [17:Full Thickness Without Exposed] [18:Full Thickness Without Exposed] [19:Full Thickness Without Exposed] Classification: [17:Support Structures Medium] [18:Support Structures Medium] [19:Support Structures Medium] Exudate Amount: [17:Purulent] [18:Purulent] [19:Purulent] Exudate Type: [17:yellow, brown, green] [18:yellow, brown, green] [19:yellow, brown, green] Exudate Color: [17:Distinct, outline attached] [18:Well defined, not attached] [19:Well defined, not attached] Wound Margin: [17:Medium (34-66%)]  [18:Large (67-100%)] [19:Medium (34-66%)] Granulation Amount: [17:Red] [18:Red] [19:Red] Granulation Quality: [17:Medium (34-66%)] [18:Small (1-33%)] [19:Medium (34-66%)] Necrotic Amount: [17:Fat Layer (Subcutaneous Tissue): Yes Fat Layer (Subcutaneous Tissue): Yes Fat Layer (Subcutaneous Tissue): Yes] Exposed Structures: [17:Fascia: No Tendon: No Muscle: No Joint: No Bone: No None] [18:Fascia: No Tendon: No Muscle: No Joint: No Bone: No None] [19:Fascia: No Tendon: No Muscle: No Joint: No Bone: No None] Wound Number: 20 N/A N/A Photos: N/A N/A Left, Lateral Ankle N/A N/A Wound Location: Gradually Appeared N/A N/A Wounding Event: Sickle Cell Lesion N/A N/A Primary Etiology: Anemia, Sickle Cell Disease, N/A N/A Comorbid History: Neuropathy 12/25/2020 N/A N/A Date Acquired: 0 N/A N/A Weeks of Treatment: Open N/A N/A Wound Status: No N/A N/A Clustered Wound: 2.5x1.3x0.1 N/A N/A Measurements L x W x D (cm) 2.553 N/A N/A A (cm) : rea 0.255 N/A N/A Volume (cm) : 0.00% N/A N/A % Reduction in Area: 0.00% N/A N/A % Reduction in Volume: Full Thickness Without Exposed N/A N/A Classification: Support Structures Medium N/A N/A Exudate Amount: Purulent N/A N/A Exudate Type: yellow, brown, green N/A N/A Exudate Color: Distinct, outline attached N/A N/A Wound Margin: Small (1-33%) N/A N/A Granulation Amount: Red N/A N/A Granulation Quality: Large (67-100%) N/A N/A Necrotic Amount: Fat Layer (Subcutaneous Tissue): Yes N/A N/A Exposed Structures: Fascia: No Tendon: No Muscle: No Joint: No Bone: No None N/A N/A Epithelialization: Treatment Notes Wound #17 (Lower Leg) Wound Laterality: Right, Circumferential Cleanser Soap and Water Discharge Instruction: May shower and wash wound with dial antibacterial soap and water prior to dressing change. Wound Cleanser Discharge Instruction: Cleanse the wound with wound cleanser prior to applying a clean dressing using gauze  sponges, not tissue or cotton balls. Peri-Wound Care Topical Primary Dressing Hydrofera Blue Ready Foam, 2.5 x2.5 in Discharge Instruction: Apply to wound bed as instructed Santyl Ointment Discharge Instruction: Apply nickel thick amount to wound bed as instructed Secondary Dressing ABD Pad, 5x9 Discharge Instruction: Apply over primary dressing as directed. Secured With Compression Wrap Kerlix Roll 4.5x3.1 (in/yd) Discharge Instruction: Apply Kerlix and Coban compression as directed. Coban Self-Adherent Wrap 4x5 (in/yd) Discharge Instruction: Apply over Kerlix as directed. Compression Stockings Add-Ons Wound #18 (Ankle) Wound Laterality: Left, Posterior Cleanser Soap and Water Discharge Instruction: May shower and wash wound with dial antibacterial soap and water prior to dressing change. Wound Cleanser Discharge Instruction: Cleanse the wound with wound cleanser prior to applying a clean dressing using gauze sponges, not tissue or cotton balls. Peri-Wound Care Topical Primary Dressing Hydrofera Blue Ready Foam, 2.5 x2.5 in Discharge Instruction: Apply to wound bed as instructed Santyl Ointment Discharge Instruction: Apply nickel thick amount to wound bed as instructed Secondary Dressing ABD Pad, 5x9 Discharge Instruction: Apply over primary dressing as  directed. Secured With Compression Wrap Kerlix Roll 4.5x3.1 (in/yd) Discharge Instruction: Apply Kerlix and Coban compression as directed. Coban Self-Adherent Wrap 4x5 (in/yd) Discharge Instruction: Apply over Kerlix as directed. Compression Stockings Add-Ons Wound #19 (Ankle) Wound Laterality: Left, Medial Cleanser Soap and Water Discharge Instruction: May shower and wash wound with dial antibacterial soap and water prior to dressing change. Wound Cleanser Discharge Instruction: Cleanse the wound with wound cleanser prior to applying a clean dressing using gauze sponges, not tissue or cotton balls. Peri-Wound  Care Topical Primary Dressing Hydrofera Blue Ready Foam, 2.5 x2.5 in Discharge Instruction: Apply to wound bed as instructed Santyl Ointment Discharge Instruction: Apply nickel thick amount to wound bed as instructed Secondary Dressing ABD Pad, 5x9 Discharge Instruction: Apply over primary dressing as directed. Secured With Compression Wrap Kerlix Roll 4.5x3.1 (in/yd) Discharge Instruction: Apply Kerlix and Coban compression as directed. Coban Self-Adherent Wrap 4x5 (in/yd) Discharge Instruction: Apply over Kerlix as directed. Compression Stockings Add-Ons Wound #20 (Ankle) Wound Laterality: Left, Lateral Cleanser Soap and Water Discharge Instruction: May shower and wash wound with dial antibacterial soap and water prior to dressing change. Wound Cleanser Discharge Instruction: Cleanse the wound with wound cleanser prior to applying a clean dressing using gauze sponges, not tissue or cotton balls. Peri-Wound Care Topical Primary Dressing Hydrofera Blue Ready Foam, 2.5 x2.5 in Discharge Instruction: Apply to wound bed as instructed Santyl Ointment Discharge Instruction: Apply nickel thick amount to wound bed as instructed Secondary Dressing ABD Pad, 5x9 Discharge Instruction: Apply over primary dressing as directed. Secured With Compression Wrap Kerlix Roll 4.5x3.1 (in/yd) Discharge Instruction: Apply Kerlix and Coban compression as directed. Coban Self-Adherent Wrap 4x5 (in/yd) Discharge Instruction: Apply over Kerlix as directed. Compression Stockings Add-Ons Electronic Signature(s) Signed: 04/17/2021 5:06:47 PM By: Linton Ham MD Entered By: Linton Ham on 04/17/2021 12:31:03 -------------------------------------------------------------------------------- Multi-Disciplinary Care Plan Details Patient Name: Date of Service: [name], [place] [name] 04/17/2021 10:30 A M Medical Record Number: [id number] Patient Account Number: [id number] Date of Birth/Sex: Treating  RN: 09-Nov-1970 (50 y.o. Sue Lush Primary Care Layden Caterino: Other Clinician: [name] Referring Tanee Henery: Treating Shaunta Oncale/Extender: Farrel Demark Weeks in Treatment: 0 Active Inactive Wound/Skin Impairment Nursing Diagnoses: Impaired tissue integrity Goals: Patient/caregiver will verbalize understanding of skin care regimen Date Initiated: 04/17/2021 Target Resolution Date: 05/15/2021 Goal Status: Active Ulcer/skin breakdown will have a volume reduction of 30% by week 4 Date Initiated: 04/17/2021 Target Resolution Date: 05/15/2021 Goal Status: Active Interventions: Assess patient/caregiver ability to obtain necessary supplies Assess patient/caregiver ability to perform ulcer/skin care regimen upon admission and as needed Assess ulceration(s) every visit Provide education on ulcer and skin care Treatment Activities: Topical wound management initiated : 04/17/2021 Notes: Electronic Signature(s) Signed: 04/17/2021 5:43:30 PM By: Lorrin Jackson Entered By: Lorrin Jackson on 04/17/2021 11:27:46 -------------------------------------------------------------------------------- Pain Assessment Details Patient Name: Date of Service: [name], [name] 04/17/2021 10:30 A M Medical Record Number: [id number] Patient Account Number: [id number] Date of Birth/Sex: Treating RN: [birth year] (50 y.o. Sue Lush Primary Care Katherleen Folkes: [name] Other Clinician: Referring Adalei Novell: Treating Aleisa Howk/Extender: Farrel Demark Weeks in Treatment: 0 Active Problems Location of Pain Severity and Description of Pain Patient Has Paino Yes Site Locations Pain Location: Pain in Ulcers With Dressing Change: Yes Duration of the Pain. Constant / Intermittento Intermittent Rate the pain. Current Pain Level: 8 Character of Pain Describe the Pain: Throbbing Pain Management and Medication Current Pain Management: Medication:  Yes Cold Application: No Rest: Yes Massage: No Activity: No T.E.N.S.: No  Heat Application: No Leg drop or elevation: No Is the Current Pain Management Adequate: Adequate How does your wound impact your activities of daily livingo Sleep: Yes Bathing: No Appetite: No Relationship With Others: No Bladder Continence: No Emotions: No Bowel Continence: No Work: No Toileting: No Drive: No Dressing: No Hobbies: No Electronic Signature(s) Signed: 04/17/2021 5:43:30 PM By: Lorrin Jackson Entered By: Lorrin Jackson on 04/17/2021 11:27:11 -------------------------------------------------------------------------------- Patient/Caregiver Education Details Patient Name: Date of Service: [name], [name] 11/29/2022andnbsp10:30 [place] Record Number: [id number] Patient Account Number: [id number] Date of Birth/Gender: Treating RN: 09/25/1970 (50 y.o. Sue Lush Primary Care Physician: [name] Other Clinician: Referring Physician: Treating Physician/Extender: Bernette Redbird in Treatment: 0 Education Assessment Education Provided To: Patient Education Topics Provided Venous: Methods: Explain/Verbal, Printed Responses: State content correctly Wound/Skin Impairment: Methods: Demonstration, Explain/Verbal, Printed Responses: State content correctly Electronic Signature(s) Signed: 04/17/2021 5:43:30 PM By: Lorrin Jackson Entered By: Lorrin Jackson on 04/17/2021 12:01:24 -------------------------------------------------------------------------------- Wound Assessment Details Patient Name: Date of Service: [name], [name] 04/17/2021 10:30 A M Medical Record Number: [id number] Patient Account Number: [id number] Date of Birth/Sex: Treating RN: 16-May-1971 (50 y.o. Sue Lush Primary Care Lutisha Knoche: [name] Other Clinician: Referring Quadry Kampa: Treating Hafsa Lohn/Extender: Farrel Demark Weeks in Treatment:  0 Wound Status Wound Number: 17 Primary Etiology: Sickle Cell Lesion Wound Location: Right, Circumferential Lower Leg Wound Status: Open Wounding Event: Gradually Appeared Comorbid History: Anemia, Sickle Cell Disease, Neuropathy Date Acquired: 10/05/2012 Weeks Of Treatment: 0 Clustered Wound: Yes Photos Wound Measurements Length: (cm) 16.5 Width: (cm) 14.5 Depth: (cm) 0.1 Area: (cm) 187.907 Volume: (cm) 18.791 % Reduction in Area: 0% % Reduction in Volume: 0% Epithelialization: None Tunneling: No Undermining: No Wound Description Classification: Full Thickness Without Exposed Support Structures Wound Margin: Distinct, outline attached Exudate Amount: Medium Exudate Type: Purulent Exudate Color: yellow, brown, green Foul Odor After Cleansing: No Slough/Fibrino Yes Wound Bed Granulation Amount: Medium (34-66%) Exposed Structure Granulation Quality: Red Fascia Exposed: No Necrotic Amount: Medium (34-66%) Fat Layer (Subcutaneous Tissue) Exposed: Yes Necrotic Quality: Adherent Slough Tendon Exposed: No Muscle Exposed: No Joint Exposed: No Bone Exposed: No Treatment Notes Wound #17 (Lower Leg) Wound Laterality: Right, Circumferential Cleanser Soap and Water Discharge Instruction: May shower and wash wound with dial antibacterial soap and water prior to dressing change. Wound Cleanser Discharge Instruction: Cleanse the wound with wound cleanser prior to applying a clean dressing using gauze sponges, not tissue or cotton balls. Peri-Wound Care Topical Primary Dressing Hydrofera Blue Ready Foam, 2.5 x2.5 in Discharge Instruction: Apply to wound bed as instructed Santyl Ointment Discharge Instruction: Apply nickel thick amount to wound bed as instructed Secondary Dressing ABD Pad, 5x9 Discharge Instruction: Apply over primary dressing as directed. Secured With Compression Wrap Kerlix Roll 4.5x3.1 (in/yd) Discharge Instruction: Apply Kerlix and Coban compression  as directed. Coban Self-Adherent Wrap 4x5 (in/yd) Discharge Instruction: Apply over Kerlix as directed. Compression Stockings Add-Ons Electronic Signature(s) Signed: 04/17/2021 5:43:30 PM By: Lorrin Jackson Entered By: Lorrin Jackson on 04/17/2021 11:48:39 -------------------------------------------------------------------------------- Wound Assessment Details Patient Name: Date of Service: [name],  [name] 04/17/2021 10:30 A M Medical Record Number: [id number] Patient Account Number: [id number] Date of Birth/Sex: Treating RN: 1970/07/26 (50 y.o. Sue Lush Primary Care Katrena Stehlin: [name] Other Clinician: Referring Vaeda Westall: Treating Kiwan Gadsden/Extender: Farrel Demark Weeks in Treatment: 0 Wound Status Wound Number: 18 Primary Etiology: Sickle Cell Lesion Wound Location: Left, Posterior Ankle Wound Status: Open Wounding Event: Gradually Appeared Comorbid History:  Anemia, Sickle Cell Disease, Neuropathy Date Acquired: 12/25/2020 Weeks Of Treatment: 0 Clustered Wound: No Photos Wound Measurements Length: (cm) 6 Width: (cm) 3 Depth: (cm) 0.3 Area: (cm) 14.137 Volume: (cm) 4.241 % Reduction in Area: 0% % Reduction in Volume: 0% Epithelialization: None Tunneling: No Undermining: No Wound Description Classification: Full Thickness Without Exposed Support Structures Wound Margin: Well defined, not attached Exudate Amount: Medium Exudate Type: Purulent Exudate Color: yellow, brown, green Foul Odor After Cleansing: No Slough/Fibrino Yes Wound Bed Granulation Amount: Large (67-100%) Exposed Structure Granulation Quality: Red Fascia Exposed: No Necrotic Amount: Small (1-33%) Fat Layer (Subcutaneous Tissue) Exposed: Yes Necrotic Quality: Adherent Slough Tendon Exposed: No Muscle Exposed: No Joint Exposed: No Bone Exposed: No Treatment Notes Wound #18 (Ankle) Wound Laterality: Left, Posterior Cleanser Soap and Water Discharge  Instruction: May shower and wash wound with dial antibacterial soap and water prior to dressing change. Wound Cleanser Discharge Instruction: Cleanse the wound with wound cleanser prior to applying a clean dressing using gauze sponges, not tissue or cotton balls. Peri-Wound Care Topical Primary Dressing Hydrofera Blue Ready Foam, 2.5 x2.5 in Discharge Instruction: Apply to wound bed as instructed Santyl Ointment Discharge Instruction: Apply nickel thick amount to wound bed as instructed Secondary Dressing ABD Pad, 5x9 Discharge Instruction: Apply over primary dressing as directed. Secured With Compression Wrap Kerlix Roll 4.5x3.1 (in/yd) Discharge Instruction: Apply Kerlix and Coban compression as directed. Coban Self-Adherent Wrap 4x5 (in/yd) Discharge Instruction: Apply over Kerlix as directed. Compression Stockings Add-Ons Electronic Signature(s) Signed: 04/17/2021 5:43:30 PM By: Lorrin Jackson Entered By: Lorrin Jackson on 04/17/2021 11:23:18 -------------------------------------------------------------------------------- Wound Assessment Details Patient Name: Date of Service: [name], [place] [name] 04/17/2021 10:30 A M Medical Record Number: [id number] Patient Account Number: [id number] Date of Birth/Sex: Treating RN: 04-20-1971 (50 y.o. Sue Lush Primary Care Pearlie Nies: [name] Other Clinician: Referring Lylie Blacklock: Treating Aithana Kushner/Extender: Farrel Demark Weeks in Treatment: 0 Wound Status Wound Number: 19 Primary Etiology: Sickle Cell Lesion Wound Location: Left, Medial Ankle Wound Status: Open Wounding Event: Gradually Appeared Comorbid History: Anemia, Sickle Cell Disease, Neuropathy Date Acquired: 12/25/2020 Weeks Of Treatment: 0 Clustered Wound: No Photos Wound Measurements Length: (cm) 6 Width: (cm) 4.5 Depth: (cm) 0.7 Area: (cm) 21.206 Volume: (cm) 14.844 % Reduction in Area: 0% % Reduction in Volume:  0% Epithelialization: None Tunneling: No Undermining: No Wound Description Classification: Full Thickness Without Exposed Support Structures Wound Margin: Well defined, not attached Exudate Amount: Medium Exudate Type: Purulent Exudate Color: yellow, brown, green Foul Odor After Cleansing: No Slough/Fibrino Yes Wound Bed Granulation Amount: Medium (34-66%) Exposed Structure Granulation Quality: Red Fascia Exposed: No Necrotic Amount: Medium (34-66%) Fat Layer (Subcutaneous Tissue) Exposed: Yes Necrotic Quality: Adherent Slough Tendon Exposed: No Muscle Exposed: No Joint Exposed: No Bone Exposed: No Treatment Notes Wound #19 (Ankle) Wound Laterality: Left, Medial Cleanser Soap and Water Discharge Instruction: May shower and wash wound with dial antibacterial soap and water prior to dressing change. Wound Cleanser Discharge Instruction: Cleanse the wound with wound cleanser prior to applying a clean dressing using gauze sponges, not tissue or cotton balls. Peri-Wound Care Topical Primary Dressing Hydrofera Blue Ready Foam, 2.5 x2.5 in Discharge Instruction: Apply to wound bed as instructed Santyl Ointment Discharge Instruction: Apply nickel thick amount to wound bed as instructed Secondary Dressing ABD Pad, 5x9 Discharge Instruction: Apply over primary dressing as directed. Secured With Compression Wrap Kerlix Roll 4.5x3.1 (in/yd) Discharge Instruction: Apply Kerlix and Coban compression as directed. Coban Self-Adherent Wrap 4x5 (in/yd) Discharge Instruction: Apply over  Kerlix as directed. Compression Stockings Add-Ons Electronic Signature(s) Signed: 04/17/2021 5:43:30 PM By: Lorrin Jackson Signed: 04/17/2021 5:43:30 PM By: Lorrin Jackson Entered By: Lorrin Jackson on 04/17/2021 11:23:52 -------------------------------------------------------------------------------- Wound Assessment Details Patient Name: Date of Service: [name], [name] 04/17/2021 10:30 A  M Medical Record Number: [id number] Patient Account Number: [id number] Date of Birth/Sex: Treating RN: Aug 11, 1970 (50 y.o. Sue Lush Primary Care Lila Lufkin: [name] Other Clinician: Referring Mayerly Kaman: Treating Roma Bierlein/Extender: Farrel Demark Weeks in Treatment: 0 Wound Status Wound Number: 20 Primary Etiology: Sickle Cell Lesion Wound Location: Left, Lateral Ankle Wound Status: Open Wounding Event: Gradually Appeared Comorbid History: Anemia, Sickle Cell Disease, Neuropathy Date Acquired: 12/25/2020 Weeks Of Treatment: 0 Clustered Wound: No Photos Wound Measurements Length: (cm) 2.5 Width: (cm) 1.3 Depth: (cm) 0.1 Area: (cm) 2.553 Volume: (cm) 0.255 % Reduction in Area: 0% % Reduction in Volume: 0% Epithelialization: None Tunneling: No Undermining: No Wound Description Classification: Full Thickness Without Exposed Support Structures Wound Margin: Distinct, outline attached Exudate Amount: Medium Exudate Type: Purulent Exudate Color: yellow, brown, green Foul Odor After Cleansing: No Slough/Fibrino Yes Wound Bed Granulation Amount: Small (1-33%) Exposed Structure Granulation Quality: Red Fascia Exposed: No Necrotic Amount: Large (67-100%) Fat Layer (Subcutaneous Tissue) Exposed: Yes Necrotic Quality: Adherent Slough Tendon Exposed: No Muscle Exposed: No Joint Exposed: No Bone Exposed: No Treatment Notes Wound #20 (Ankle) Wound Laterality: Left, Lateral Cleanser Soap and Water Discharge Instruction: May shower and wash wound with dial antibacterial soap and water prior to dressing change. Wound Cleanser Discharge Instruction: Cleanse the wound with wound cleanser prior to applying a clean dressing using gauze sponges, not tissue or cotton balls. Peri-Wound Care Topical Primary Dressing Hydrofera Blue Ready Foam, 2.5 x2.5 in Discharge Instruction: Apply to wound bed as instructed Santyl Ointment Discharge Instruction:  Apply nickel thick amount to wound bed as instructed Secondary Dressing ABD Pad, 5x9 Discharge Instruction: Apply over primary dressing as directed. Secured With Compression Wrap Kerlix Roll 4.5x3.1 (in/yd) Discharge Instruction: Apply Kerlix and Coban compression as directed. Coban Self-Adherent Wrap 4x5 (in/yd) Discharge Instruction: Apply over Kerlix as directed. Compression Stockings Add-Ons Electronic Signature(s) Signed: 04/17/2021 5:43:30 PM By: Lorrin Jackson Entered By: Lorrin Jackson on 04/17/2021 11:24:31 -------------------------------------------------------------------------------- Vitals Details Patient Name: Date of Service: [name], [name] 04/17/2021 10:30 A M Medical Record Number: [id number] Patient Account Number: [id number] Date of Birth/Sex: Treating RN: 17-Jul-1970 (50 y.o. Sue Lush Primary Care Daune Divirgilio: [name] Other Clinician: Referring Nikiah Goin: Treating Eriq Hufford/Extender: Farrel Demark Weeks in Treatment: 0 Vital Signs Time Taken: 10:54 Temperature (F): 97.8 Height (in): 67 Pulse (bpm): 65 Source: Stated Respiratory Rate (breaths/min): 16 Blood Pressure (mmHg): 150/82 Reference Range: 80 - 120 mg / dl Electronic Signature(s) Signed: 04/17/2021 5:43:30 PM By: Lorrin Jackson Entered By: Lorrin Jackson on 04/17/2021 10:56:56

## 2021-04-17 NOTE — Progress Notes (Signed)
MICHELENE, KENISTON ([id number]) Visit Report for 04/17/2021 Chief Complaint Document Details Patient Name: Date of Service: [name] 04/17/2021 10:30 A M Medical Record Number: [id number] Patient Account Number: [id number] Date of Birth/Sex: Treating RN: 06-Nov-1970 (50 y.o. F) Primary Care Provider: [name] Other Clinician: Referring Provider: Treating Provider/Extender: Windy Carina, Lovett Sox Weeks in Treatment: 0 Information Obtained from: Patient Chief Complaint the patient is here for evaluation of her bilateral lower extremity sickle cell ulcers 04/17/2021; patient comes in for substantial wounds on the right and left lower leg Electronic Signature(s) Signed: 04/17/2021 5:06:47 PM By: Linton Ham MD Entered By: Linton Ham on 04/17/2021 12:36:43 -------------------------------------------------------------------------------- HPI Details Patient Name: Date of Service: [name], [name] 04/17/2021 10:30 A M Medical Record Number: [id number] Patient Account Number: [id number] Date of Birth/Sex: Treating RN: Aug 03, 1970 (50 y.o. F) Primary Care Provider: [name] Other Clinician: Referring Provider: Treating Provider/Extender: Farrel Demark Weeks in Treatment: 0 History of Present Illness Location: medial and lateral ankle region on the right and left medial malleolus Quality: Patient reports experiencing a shooting pain to affected area(s). Severity: Patient states wound(s) are getting worse. Duration: right lower extremity bimalleolar ulcers have been present for approximately 2 years; the rright meedial malleolus ulcer has been there proximally 6 months Timing: Pain in wound is constant (hurts all the time) Context: The wound would happen gradually ssociated Signs and Symptoms: Patient reports having increase discharge. A HPI Description: 51 year old patient with a history of sickle cell anemia who was last seen by me  with ulceration of the right lower extremity above the ankle and was referred to Dr. Leland Johns for a surgical debridement as I was unable to do anything in the office due to excruciating pain. At that stage she was referred from the plastic surgery service to dermatology who treated her for a skin infection with doxycycline and then Levaquin and a local antibiotic ointment. I understand the patient has since developed ulceration on the left ankle both medial and lateral and was now referred back to the wound center as dermatology has finished the management. I do not have any notes from the dermatology department Old notes: 50 year old patient with a history of sickle cell anemia, pain bilateral lower extremities, right lower extremity ulcer and has a history of receiving a skin graft( Theraskin) several months ago. She has been visiting the wound center [facility] and was seen by Dr. Dellia Nims and Dr. Leland Johns. after prolonged conservator therapy between July 2016 and January 2017. She had been seen by the plastic surgeon and taken to the OR for debridement and application of Theraskin. She had 3 applications of Theraskin and was then treated with collagen. Prior to that she had a history of similar problems in 2014 and was treated conservatively. Had a reflux study done for the right lower extremity in August 2016 without reflux or DVT . Past medical history significant for sickle cell disease, anemia, leg ulcers, cholelithiasis,and has never been a smoker. Once the patient was discharged on the wound center she says within 2 or 3 weeks the problems recurred and she has been treating it conservatively. since I saw her 3 weeks ago at [facility] she has been unable to get her dressing material but has completed a course of doxycycline. 6/7/ 2017 -- lower extremity venous duplex reflux evaluation was done No evidence of SVT or DVT in the RLL. No venous incompetence in the RLL. No further vascular workup  is indicated at this  time. She was seen by Dr. [name], on 10/04/2015. She agreed with the plan of taking her to the OR for debridement and application of theraskin and would also take biopsies to rule out pyoderma gangrenosum. Follow-up note dated May 31 received and she was status post application of Theraskin to multiple ulcers around the right ankle. Pathology did not show evidence of malignancy or pyoderma gangrenosum. She would continue to see as in the wound clinic for further care and see Dr. Leland Johns as needed. The patient brought the biopsy report and it was consistent with stasis ulcer no evidence of malignancy and the comment was that there was some adjacent neovascularization, fibrosis and patchy perivascular chronic inflammation. 11/15/2015 -- today we applied her first application of Theraskin 11/30/15; TheraSkin #2 12/13/2015 -- she is having a lot of pain locally and is here for possible application of a theraskin today. 01/16/2016 -- the patient has significant pain and has noticed despite in spite of all local care and oral pain medication. It is impossible to debride her in the office. 02/06/2016 -- I do not see any notes from Dr. Iran Planas( the patient has not made a call to the office know as she heard from them) and the only visit to recently was with her PCP Dr. Danella Penton -- I saw her on 01/16/2016 and prescribed 90 tablets of oxycodone 10 mg and did lab work and screening for HIV. the HIV was negative and hemoglobin was 6.3 with a WBC count of 14.9 and hematocrit of 17.8 with platelets of 561. 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 The bimalleolar ulcers to the right lower extremity have been present for approximately 2 years, the left medial malleolus ulcer has been present approximately 6 months. She has followed with Dr.Thimmappa in the past  and has had a total of 3 applications of Theraskin (01/2015, 09/2015, 06/17/16). She has also followed with Dr. Con Memos here in the clinic and has received 2 applications of TheraSkin (11/10/15, 11/30/15). The patient does experience chronic, and is not amenable to debridement. 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 She is not currently on any antibiotic therapy and has not been treated with any recently. 07/17/2016 -- was seen by Dr. Iran Planas of plastic surgery who saw her 2 weeks postop application of Theraskin #3. She had removed her dressing and asked her to apply silver alginate on alternate days and follow-up back with the wound center. Future debridements and application of skin substitute would have to be done in the hospital due to her high risk for anesthesia. READMISSION 04/17/2021 Patient is now a 50 year old woman that we have had in this clinic for a prolonged period of time and 2016-2017 and then again for 2 visits in February 2018. At that point she had wounds on the right lower leg predominantly medial. She had also been seen by plastic surgery Dr. Leland Johns who I believe took her to the OR for operative debridement and application of TheraSkin in 2017. After she left our clinic she was followed for a very prolonged period of time in the wound care center in [facility] who then referred her ultimately to [facility] where she was seen by Dr. Vernona Rieger. Again taken her to the OR for skin grafting which apparently did not take. She had multiple other attempts at dressings although I have not really  looked over all of these notes in great detail. She has not been seen in a wound care center in about a year. She states over the last year in addition to her right lower leg she has developed wounds on the left lower leg quite extensive. She is using Xeroform to all of these wounds without really any improvement. She also has Medicaid which does not cover wound  products. The patient has had vascular work-ups in the past including most recently on 03/28/2021 showing biphasic waveforms on the right triphasic at the PTA and biphasic at the dorsalis pedis on the left. She was unable to tolerate any degree of compression to do ABIs. Unfortunately TBI's were also not done. She had venous reflux studies done in 2017. This did not show any evidence of a DVT or SVT and no venous incompetence was noted in the right leg at the time this was the only side with the wound As noted I did not look all over her old records. She apparently had a course of HBO and [facility] although I am not sure what the indication would have been. In any case she developed seizures and terminated treatment earlier. She is generally much more disabled than when we last saw her in clinic. She can no longer walk pretty much wheelchair-bound because predominantly of pain in the left hip. Electronic Signature(s) Signed: 04/17/2021 5:06:47 PM By: Linton Ham MD Entered By: Linton Ham on 04/17/2021 12:36:16 -------------------------------------------------------------------------------- Physical Exam Details Patient Name: Date of Service: [name], [name] 04/17/2021 10:30 A M Medical Record Number: [id number] Patient Account Number: [id number] Date of Birth/Sex: Treating RN: Aug 07, 1970 (50 y.o. F) Primary Care Provider: [name] Other Clinician: Referring Provider: Treating Provider/Extender: Farrel Demark Weeks in Treatment: 0 Constitutional Patient is hypertensive.. Pulse regular and within target range for patient.Marland Kitchen Respirations regular, non-labored and within target range.. Temperature is normal and within the target range for the patient.Marland Kitchen Appears in no distress. Cardiovascular Heart rhythm and rate regular, without murmur or gallop. JVP not elevated. Femoral and popliteal pulses are palpable on both the right and left sides. Pedal pulses are  palpable at both the dorsalis pedis and posterior tibial. Integumentary (Hair, Skin) There is some skin discoloration on both lower extremities that looks like venous stasis. Notes Wound exam; the patient has substantial wounds on the right lower leg medially and laterally joined posteriorly and only a small sliver of normal skin anteriorly. This is for all intents and purposes is circumferential. The wound surface covered apparently and tightly adherent fibrinous debris. I did not attempt mechanical debridement even washing these with saline gauze was almost intolerable for the patient. On the left she has substantial wounds on the left medial left posterior lower leg both of these with some depth and nonviable surface and a smaller area on the left lateral. Fortunately there is no evidence of infection surrounding any wound area Electronic Signature(s) Signed: 04/17/2021 5:06:47 PM By: Linton Ham MD Entered By: Linton Ham on 04/17/2021 12:39:03 -------------------------------------------------------------------------------- Physician Orders Details Patient Name: Date of Service: [name], [name] 04/17/2021 10:30 A M Medical Record Number: [id number] Patient Account Number: [id number] Date of Birth/Sex: Treating RN: Mar 18, 1971 (50 y.o. Sue Lush Primary Care Provider: [name] Other Clinician: Referring Provider: Treating Provider/Extender: Farrel Demark Weeks in Treatment: 0 Verbal / Phone Orders: No Diagnosis Coding Follow-up Appointments ppointment in 1 week. - with Dr. Dellia Nims Return A Nurse Visit: - Thursday for dressing change  Bathing/ Shower/ Hygiene May shower with protection but do not get wound dressing(s) wet. - Can get cast protector bags at [facility] or [facility] Additional Orders / Instructions Follow Nutritious Diet Wound Treatment Wound #17 - Lower Leg Wound Laterality: Right, Circumferential Cleanser: Soap and Water 2 x Per  Week/30 Days Discharge Instructions: May shower and wash wound with dial antibacterial soap and water prior to dressing change. Cleanser: Wound Cleanser 2 x Per Week/30 Days Discharge Instructions: Cleanse the wound with wound cleanser prior to applying a clean dressing using gauze sponges, not tissue or cotton balls. Prim Dressing: Hydrofera Blue Ready Foam, 2.5 x2.5 in 2 x Per Week/30 Days ary Discharge Instructions: Apply to wound bed as instructed Prim Dressing: Santyl Ointment 2 x Per Week/30 Days ary Discharge Instructions: Apply nickel thick amount to wound bed as instructed Secondary Dressing: ABD Pad, 5x9 2 x Per Week/30 Days Discharge Instructions: Apply over primary dressing as directed. Compression Wrap: Kerlix Roll 4.5x3.1 (in/yd) 2 x Per Week/30 Days Discharge Instructions: Apply Kerlix and Coban compression as directed. Compression Wrap: Coban Self-Adherent Wrap 4x5 (in/yd) 2 x Per Week/30 Days Discharge Instructions: Apply over Kerlix as directed. Wound #18 - Ankle Wound Laterality: Left, Posterior Cleanser: Soap and Water 2 x Per Week/30 Days Discharge Instructions: May shower and wash wound with dial antibacterial soap and water prior to dressing change. Cleanser: Wound Cleanser 2 x Per Week/30 Days Discharge Instructions: Cleanse the wound with wound cleanser prior to applying a clean dressing using gauze sponges, not tissue or cotton balls. Prim Dressing: Hydrofera Blue Ready Foam, 2.5 x2.5 in 2 x Per Week/30 Days ary Discharge Instructions: Apply to wound bed as instructed Prim Dressing: Santyl Ointment 2 x Per Week/30 Days ary Discharge Instructions: Apply nickel thick amount to wound bed as instructed Secondary Dressing: ABD Pad, 5x9 2 x Per Week/30 Days Discharge Instructions: Apply over primary dressing as directed. Compression Wrap: Kerlix Roll 4.5x3.1 (in/yd) 2 x Per Week/30 Days Discharge Instructions: Apply Kerlix and Coban compression as  directed. Compression Wrap: Coban Self-Adherent Wrap 4x5 (in/yd) 2 x Per Week/30 Days Discharge Instructions: Apply over Kerlix as directed. Wound #19 - Ankle Wound Laterality: Left, Medial Cleanser: Soap and Water 2 x Per Week/30 Days Discharge Instructions: May shower and wash wound with dial antibacterial soap and water prior to dressing change. Cleanser: Wound Cleanser 2 x Per Week/30 Days Discharge Instructions: Cleanse the wound with wound cleanser prior to applying a clean dressing using gauze sponges, not tissue or cotton balls. Prim Dressing: Hydrofera Blue Ready Foam, 2.5 x2.5 in 2 x Per Week/30 Days ary Discharge Instructions: Apply to wound bed as instructed Prim Dressing: Santyl Ointment 2 x Per Week/30 Days ary Discharge Instructions: Apply nickel thick amount to wound bed as instructed Secondary Dressing: ABD Pad, 5x9 2 x Per Week/30 Days Discharge Instructions: Apply over primary dressing as directed. Compression Wrap: Kerlix Roll 4.5x3.1 (in/yd) 2 x Per Week/30 Days Discharge Instructions: Apply Kerlix and Coban compression as directed. Compression Wrap: Coban Self-Adherent Wrap 4x5 (in/yd) 2 x Per Week/30 Days Discharge Instructions: Apply over Kerlix as directed. Wound #20 - Ankle Wound Laterality: Left, Lateral Cleanser: Soap and Water 2 x Per Week/30 Days Discharge Instructions: May shower and wash wound with dial antibacterial soap and water prior to dressing change. Cleanser: Wound Cleanser 2 x Per Week/30 Days Discharge Instructions: Cleanse the wound with wound cleanser prior to applying a clean dressing using gauze sponges, not tissue or cotton balls. Prim Dressing: Hydrofera Blue Ready  Foam, 2.5 x2.5 in 2 x Per Week/30 Days ary Discharge Instructions: Apply to wound bed as instructed Prim Dressing: Santyl Ointment 2 x Per Week/30 Days ary Discharge Instructions: Apply nickel thick amount to wound bed as instructed Secondary Dressing: ABD Pad, 5x9 2 x Per  Week/30 Days Discharge Instructions: Apply over primary dressing as directed. Compression Wrap: Kerlix Roll 4.5x3.1 (in/yd) 2 x Per Week/30 Days Discharge Instructions: Apply Kerlix and Coban compression as directed. Compression Wrap: Coban Self-Adherent Wrap 4x5 (in/yd) 2 x Per Week/30 Days Discharge Instructions: Apply over Kerlix as directed. Electronic Signature(s) Signed: 04/17/2021 12:41:10 PM By: Lorrin Jackson Signed: 04/17/2021 5:06:47 PM By: Linton Ham MD Entered By: Lorrin Jackson on 04/17/2021 12:41:10 -------------------------------------------------------------------------------- Problem List Details Patient Name: Date of Service: [name], [name] 04/17/2021 10:30 A M Medical Record Number: [id number] Patient Account Number: [id number] Date of Birth/Sex: Treating RN: Dec 16, 1970 (50 y.o. F) Primary Care Provider: [name] Other Clinician: Referring Provider: Treating Provider/Extender: Farrel Demark Weeks in Treatment: 0 Active Problems ICD-10 Encounter Code Description Active Date MDM Diagnosis L97.818 Non-pressure chronic ulcer of other part of right lower leg with other specified 04/17/2021 No Yes severity L97.828 Non-pressure chronic ulcer of other part of left lower leg with other specified 04/17/2021 No Yes severity 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 04/17/2021 No Yes Inactive Problems Resolved Problems Electronic Signature(s) Signed: 04/17/2021 5:06:47 PM By: Linton Ham MD Entered By: Linton Ham on 04/17/2021 12:30:41 -------------------------------------------------------------------------------- Progress Note Details Patient Name: Date of Service: [name], [name] 04/17/2021 10:30 A M Medical Record Number: [id number] Patient Account Number: [id number] Date of Birth/Sex: Treating RN: [birth year] (50 y.o. F) Primary Care Provider: [name] Other Clinician: Referring Provider: Treating  Provider/Extender: Farrel Demark Weeks in Treatment: 0 Subjective Chief Complaint Information obtained from Patient the patient is here for evaluation of her bilateral lower extremity sickle cell ulcers 04/17/2021; patient comes in for substantial wounds on the right and left lower leg History of Present Illness (HPI) The following HPI elements were documented for the patient's wound: Location: medial and lateral ankle region on the right and left medial malleolus Quality: Patient reports experiencing a shooting pain to affected area(s). Severity: Patient states wound(s) are getting worse. Duration: right lower extremity bimalleolar ulcers have been present for approximately 2 years; the rright meedial malleolus ulcer has been there proximally 6 months Timing: Pain in wound is constant (hurts all the time) Context: The wound would happen gradually Associated Signs and Symptoms: Patient reports having increase discharge. 50 year old patient with a history of sickle cell anemia who was last seen by me with ulceration of the right lower extremity above the ankle and was referred to Dr. Leland Johns for a surgical debridement as I was unable to do anything in the office due to excruciating pain. At that stage she was referred from the plastic surgery service to dermatology who treated her for a skin infection with doxycycline and then Levaquin and a local antibiotic ointment. I understand the patient has since developed ulceration on the left ankle both medial and lateral and was now referred back to the wound center as dermatology has finished the management. I do not have any notes from the dermatology department Old notes: 50 year old patient with a history of sickle cell anemia, pain bilateral lower extremities, right lower extremity ulcer and has a history of receiving a skin graft( Theraskin) several months ago. She has been visiting the wound center [facility] and was seen  by  Dr. Dellia Nims and Dr. Leland Johns. after prolonged conservator therapy between July 2016 and January 2017. She had been seen by the plastic surgeon and taken to the OR for debridement and application of Theraskin. She had 3 applications of Theraskin and was then treated with collagen. Prior to that she had a history of similar problems in 2014 and was treated conservatively. Had a reflux study done for the right lower extremity in August 2016 without reflux or DVT . Past medical history significant for sickle cell disease, anemia, leg ulcers, cholelithiasis,and has never been a smoker. Once the patient was discharged on the wound center she says within 2 or 3 weeks the problems recurred and she has been treating it conservatively. since I saw her 3 weeks ago at [facility] she has been unable to get her dressing material but has completed a course of doxycycline. 6/7/ 2017 -- lower extremity venous duplex reflux evaluation was done oo No evidence of SVT or DVT in the RLL. No venous incompetence in the RLL. No further vascular workup is indicated at this time. She was seen by Dr. [name], on 10/04/2015. She agreed with the plan of taking her to the OR for debridement and application of theraskin and would also take biopsies to rule out pyoderma gangrenosum. Follow-up note dated May 31 received and she was status post application of Theraskin to multiple ulcers around the right ankle. Pathology did not show evidence of malignancy or pyoderma gangrenosum. She would continue to see as in the wound clinic for further care and see Dr. Leland Johns as needed. The patient brought the biopsy report and it was consistent with stasis ulcer no evidence of malignancy and the comment was that there was some adjacent neovascularization, fibrosis and patchy perivascular chronic inflammation. 11/15/2015 -- today we applied her first application of Theraskin 11/30/15; TheraSkin #2 12/13/2015 -- she is having a lot  of pain locally and is here for possible application of a theraskin today. 01/16/2016 -- the patient has significant pain and has noticed despite in spite of all local care and oral pain medication. It is impossible to debride her in the office. 02/06/2016 -- I do not see any notes from Dr. Iran Planas( the patient has not made a call to the office know as she heard from them) and the only visit to recently was with her PCP Dr. Danella Penton -- I saw her on 01/16/2016 and prescribed 90 tablets of oxycodone 10 mg and did lab work and screening for HIV. the HIV was negative and hemoglobin was 6.3 with a WBC count of 14.9 and hematocrit of 17.8 with platelets of 561. 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 The bimalleolar ulcers to the right lower extremity have been present for approximately 2 years, the left medial malleolus ulcer has been present approximately 6 months. She has followed with Dr.Thimmappa in the past and has had a total of 3 applications of Theraskin (01/2015, 09/2015, 06/17/16). She has also followed with Dr. Con Memos here in the clinic and has received 2 applications of TheraSkin (11/10/15, 11/30/15). The patient does experience chronic, and is not amenable to debridement. 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 She is not currently on any antibiotic therapy and has not been treated with any recently. 07/17/2016 -- was seen by Dr. Iran Planas of plastic surgery who saw her 2 weeks postop  application of Theraskin #3. She had removed her dressing and asked her to apply silver alginate on alternate days and follow-up back with the wound center. Future debridements and application of skin substitute would have to be done in the hospital due to her high risk for anesthesia. READMISSION 04/17/2021 Patient is now a 50 year old woman that  we have had in this clinic for a prolonged period of time and 2016-2017 and then again for 2 visits in February 2018. At that point she had wounds on the right lower leg predominantly medial. She had also been seen by plastic surgery Dr. Leland Johns who I believe took her to the OR for operative debridement and application of TheraSkin in 2017. After she left our clinic she was followed for a very prolonged period of time in the wound care center in [facility] who then referred her ultimately to [facility] where she was seen by Dr. Vernona Rieger. Again taken her to the OR for skin grafting which apparently did not take. She had multiple other attempts at dressings although I have not really looked over all of these notes in great detail. She has not been seen in a wound care center in about a year. She states over the last year in addition to her right lower leg she has developed wounds on the left lower leg quite extensive. She is using Xeroform to all of these wounds without really any improvement. She also has Medicaid which does not cover wound products. The patient has had vascular work-ups in the past including most recently on 03/28/2021 showing biphasic waveforms on the right triphasic at the PTA and biphasic at the dorsalis pedis on the left. She was unable to tolerate any degree of compression to do ABIs. Unfortunately TBI's were also not done. She had venous reflux studies done in 2017. This did not show any evidence of a DVT or SVT and no venous incompetence was noted in the right leg at the time this was the only side with the wound As noted I did not look all over her old records. She apparently had a course of HBO and [facility] although I am not sure what the indication would have been. In any case she developed seizures and terminated treatment earlier. She is generally much more disabled than when we last saw her in clinic. She can no longer walk pretty much wheelchair-bound because predominantly of  pain in the left hip. Patient History Information obtained from Patient. Allergies No Known Allergies Family History Diabetes - Mother, Lung Disease - Mother, No family history of Cancer, Heart Disease, Hereditary Spherocytosis, Hypertension, Kidney Disease, Seizures, Stroke, Thyroid Problems, Tuberculosis. Social History Never smoker, Marital Status - Married, Alcohol Use - Never, Drug Use - No History, Caffeine Use - Daily. Medical History Eyes Denies history of Cataracts, Glaucoma, Optic Neuritis Ear/Nose/Mouth/Throat Denies history of Chronic sinus problems/congestion, Middle ear problems Hematologic/Lymphatic Patient has history of Anemia, Sickle Cell Disease Denies history of Hemophilia, Human Immunodeficiency Virus, Lymphedema Respiratory Denies history of Aspiration, Asthma, Chronic Obstructive Pulmonary Disease (COPD), Pneumothorax, Sleep Apnea, Tuberculosis Cardiovascular Denies history of Angina, Arrhythmia, Congestive Heart Failure, Coronary Artery Disease, Deep Vein Thrombosis, Hypertension, Hypotension, Myocardial Infarction, Peripheral Arterial Disease, Peripheral Venous Disease, Phlebitis, Vasculitis Gastrointestinal Denies history of Cirrhosis , Colitis, Crohnoos, Hepatitis A, Hepatitis B, Hepatitis C Endocrine Denies history of Type I Diabetes, Type II Diabetes Genitourinary Denies history of End Stage Renal Disease Immunological Denies history of Lupus Erythematosus, Raynaudoos, Scleroderma Integumentary (Skin) Denies history of History of  Burn Musculoskeletal Denies history of Gout, Rheumatoid Arthritis, Osteoarthritis, Osteomyelitis Neurologic Patient has history of Neuropathy - right foot intermittant Denies history of Dementia, Quadriplegia, Paraplegia, Seizure Disorder Oncologic Denies history of Received Chemotherapy, Received Radiation Psychiatric Denies history of Anorexia/bulimia, Confinement Anxiety Hospitalization/Surgery History - c section  x2. - left breast lumpectomy. - iandD right ankle with theraskin. Medical A Surgical History Notes nd Constitutional Symptoms (General Health) H/O miscarriage Cardiovascular bradycardia Gastrointestinal cholilithiasis Objective Constitutional Patient is hypertensive.. Pulse regular and within target range for patient.Marland Kitchen Respirations regular, non-labored and within target range.. Temperature is normal and within the target range for the patient.Marland Kitchen Appears in no distress. Vitals Time Taken: 10:54 AM, Height: 67 in, Source: Stated, Temperature: 97.8 F, Pulse: 65 bpm, Respiratory Rate: 16 breaths/min, Blood Pressure: 150/82 mmHg. Cardiovascular Heart rhythm and rate regular, without murmur or gallop. JVP not elevated. Femoral and popliteal pulses are palpable on both the right and left sides. Pedal pulses are palpable at both the dorsalis pedis and posterior tibial. General Notes: Wound exam; the patient has substantial wounds on the right lower leg medially and laterally joined posteriorly and only a small sliver of normal skin anteriorly. This is for all intents and purposes is circumferential. The wound surface covered apparently and tightly adherent fibrinous debris. I did not attempt mechanical debridement even washing these with saline gauze was almost intolerable for the patient. oo On the left she has substantial wounds on the left medial left posterior lower leg both of these with some depth and nonviable surface and a smaller area on the left lateral. oo Fortunately there is no evidence of infection surrounding any wound area Integumentary (Hair, Skin) There is some skin discoloration on both lower extremities that looks like venous stasis. Wound #17 status is Open. Original cause of wound was Gradually Appeared. The date acquired was: 10/05/2012. The wound is located on the Right,Circumferential Lower Leg. The wound measures 16.5cm length x 14.5cm width x 0.1cm depth; 187.907cm^2  area and 18.791cm^3 volume. There is Fat Layer (Subcutaneous Tissue) exposed. There is no tunneling or undermining noted. There is a medium amount of purulent drainage noted. The wound margin is distinct with the outline attached to the wound base. There is medium (34-66%) red granulation within the wound bed. There is a medium (34-66%) amount of necrotic tissue within the wound bed including Adherent Slough. Wound #18 status is Open. Original cause of wound was Gradually Appeared. The date acquired was: 12/25/2020. The wound is located on the Left,Posterior Ankle. The wound measures 6cm length x 3cm width x 0.3cm depth; 14.137cm^2 area and 4.241cm^3 volume. There is Fat Layer (Subcutaneous Tissue) exposed. There is no tunneling or undermining noted. There is a medium amount of purulent drainage noted. The wound margin is well defined and not attached to the wound base. There is large (67-100%) red granulation within the wound bed. There is a small (1-33%) amount of necrotic tissue within the wound bed including Adherent Slough. Wound #19 status is Open. Original cause of wound was Gradually Appeared. The date acquired was: 12/25/2020. The wound is located on the Left,Medial Ankle. The wound measures 6cm length x 4.5cm width x 0.7cm depth; 21.206cm^2 area and 14.844cm^3 volume. There is Fat Layer (Subcutaneous Tissue) exposed. There is no tunneling or undermining noted. There is a medium amount of purulent drainage noted. The wound margin is well defined and not attached to the wound base. There is medium (34-66%) red granulation within the wound bed. There is a medium (34-66%)  amount of necrotic tissue within the wound bed including Adherent Slough. Wound #20 status is Open. Original cause of wound was Gradually Appeared. The date acquired was: 12/25/2020. The wound is located on the Left,Lateral Ankle. The wound measures 2.5cm length x 1.3cm width x 0.1cm depth; 2.553cm^2 area and 0.255cm^3 volume. There  is Fat Layer (Subcutaneous Tissue) exposed. There is no tunneling or undermining noted. There is a medium amount of purulent drainage noted. The wound margin is distinct with the outline attached to the wound base. There is small (1-33%) red granulation within the wound bed. There is a large (67-100%) amount of necrotic tissue within the wound bed including Adherent Slough. 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 - with Dr. Dellia Nims Nurse Visit: - Friday for dressing change Bathing/ Shower/ Hygiene: May shower with protection but do not get wound dressing(s) wet. - Can get cast protector bags at [facility] or [facility] Additional Orders / Instructions: Follow Nutritious Diet WOUND #17: - Lower Leg Wound Laterality: Right, Circumferential Cleanser: Soap and Water 2 x Per Week/30 Days Discharge Instructions: May shower and wash wound with dial antibacterial soap and water prior to dressing change. Cleanser: Wound Cleanser 2 x Per Week/30 Days Discharge Instructions: Cleanse the wound with wound cleanser prior to applying a clean dressing using gauze sponges, not tissue or cotton balls. Prim Dressing: Hydrofera Blue Ready Foam, 2.5 x2.5 in 2 x Per Week/30 Days ary Discharge Instructions: Apply to wound bed as instructed Prim Dressing: Santyl Ointment 2 x Per Week/30 Days ary Discharge Instructions: Apply nickel thick amount to wound bed as instructed Secondary Dressing: ABD Pad, 5x9 2 x Per Week/30 Days Discharge Instructions: Apply over primary dressing as directed. Com pression Wrap: Kerlix Roll 4.5x3.1 (in/yd) 2 x Per Week/30 Days Discharge Instructions: Apply Kerlix and Coban compression as directed. Com pression Wrap: Coban  Self-Adherent Wrap 4x5 (in/yd) 2 x Per Week/30 Days Discharge Instructions: Apply over Kerlix as directed. WOUND #18: - Ankle Wound Laterality: Left, Posterior Cleanser: Soap and Water 2 x Per Week/30 Days Discharge Instructions: May shower and wash wound with dial antibacterial soap and water prior to dressing change. Cleanser: Wound Cleanser 2 x Per Week/30 Days Discharge Instructions: Cleanse the wound with wound cleanser prior to applying a clean dressing using gauze sponges, not tissue or cotton balls. Prim Dressing: Hydrofera Blue Ready Foam, 2.5 x2.5 in 2 x Per Week/30 Days ary Discharge Instructions: Apply to wound bed as instructed Prim Dressing: Santyl Ointment 2 x Per Week/30 Days ary Discharge Instructions: Apply nickel thick amount to wound bed as instructed Secondary Dressing: ABD Pad, 5x9 2 x Per Week/30 Days Discharge Instructions: Apply over primary dressing as directed. Com pression Wrap: Kerlix Roll 4.5x3.1 (in/yd) 2 x Per Week/30 Days Discharge Instructions: Apply Kerlix and Coban compression as directed. Com pression Wrap: Coban Self-Adherent Wrap 4x5 (in/yd) 2 x Per Week/30 Days Discharge Instructions: Apply over Kerlix as directed. WOUND #19: - Ankle Wound Laterality: Left, Medial Cleanser: Soap and Water 2 x Per Week/30 Days Discharge Instructions: May shower and wash wound with dial antibacterial soap and water prior to dressing change. Cleanser: Wound Cleanser 2 x Per Week/30 Days Discharge Instructions: Cleanse the wound with wound cleanser prior to applying a clean dressing using gauze sponges, not tissue or cotton  balls. Prim Dressing: Hydrofera Blue Ready Foam, 2.5 x2.5 in 2 x Per Week/30 Days ary Discharge Instructions: Apply to wound bed as instructed Prim Dressing: Santyl Ointment 2 x Per Week/30 Days ary Discharge Instructions: Apply nickel thick amount to wound bed as instructed Secondary Dressing: ABD Pad, 5x9 2 x Per Week/30 Days Discharge  Instructions: Apply over primary dressing as directed. Com pression Wrap: Kerlix Roll 4.5x3.1 (in/yd) 2 x Per Week/30 Days Discharge Instructions: Apply Kerlix and Coban compression as directed. Com pression Wrap: Coban Self-Adherent Wrap 4x5 (in/yd) 2 x Per Week/30 Days Discharge Instructions: Apply over Kerlix as directed. WOUND #20: - Ankle Wound Laterality: Left, Lateral Cleanser: Soap and Water 2 x Per Week/30 Days Discharge Instructions: May shower and wash wound with dial antibacterial soap and water prior to dressing change. Cleanser: Wound Cleanser 2 x Per Week/30 Days Discharge Instructions: Cleanse the wound with wound cleanser prior to applying a clean dressing using gauze sponges, not tissue or cotton balls. Prim Dressing: Hydrofera Blue Ready Foam, 2.5 x2.5 in 2 x Per Week/30 Days ary Discharge Instructions: Apply to wound bed as instructed Prim Dressing: Santyl Ointment 2 x Per Week/30 Days ary Discharge Instructions: Apply nickel thick amount to wound bed as instructed Secondary Dressing: ABD Pad, 5x9 2 x Per Week/30 Days Discharge Instructions: Apply over primary dressing as directed. Com pression Wrap: Kerlix Roll 4.5x3.1 (in/yd) 2 x Per Week/30 Days Discharge Instructions: Apply Kerlix and Coban compression as directed. Com pression Wrap: Coban Self-Adherent Wrap 4x5 (in/yd) 2 x Per Week/30 Days Discharge Instructions: Apply over Kerlix as directed. 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 Very painful she cannot tolerate bedside debridement or even any large-scale manipulation of these wound areas 2. It does not appear that she has significant PAD although she has not had ABIs or TBI's her pulses are easily palpable her foot is warm. 3. She has not had recent reflux studies and it might be worth repeating these 4. Based on her recent hospitalization I will see if she was seen by vein and vascular. 5. I wondered whether she could have central  venous hypertension related to pulmonary hypertension from her sickle cell disease. She tells me she had a remote echocardiogram I will try to check on this. Her bedside cardiac examination was essentially normal today 6. We had quite a long discussion. Basically anything I suggested as a plausible dressing to this substantial wound area she had either tried and found it made it worse or it was not effective. Also she has Medicaid that will not pay for wound care supplies. Ultimately I managed to talk her into a covering layer of Santyl covered and Hydrofera Blue under kerlix Coban wrap that she will not be able to change her a week. We will bring her in for a nurse visit later this week to change the dressing. There is a fair amount of drainage 7. I am uncertain whether anybody from plastic surgery would consider taking this patient back to the OR for an operative debridement perhaps replacement of ACell etc. 8. The patient is unable to walk because of pain in her left hip. The instantaneous connection being avascular necrosis and I wonder if anybody is looked into this. I spent 55 minutes in review of this patient's past medical history, face-to-face evaluation and preparation of this record. She is going to need more research including a quick review of the indication for HBO at [facility], her most recent hospitalization and  whether she had a recent vascular surgery consult Electronic Signature(s) Signed: 04/17/2021 5:06:47 PM By: Linton Ham MD Entered By: Linton Ham on 04/17/2021 12:43:32 -------------------------------------------------------------------------------- [facility] Details Patient Name: Date of Service: [name], [name] 04/17/2021 10:30 A M Medical Record Number: [id number] Patient Account Number: [id number] Date of Birth/Sex: Treating RN: Nov 27, 1970 (50 y.o. Sue Lush Primary Care Provider: [name] Other Clinician: Referring Provider: Treating  Provider/Extender: Farrel Demark Weeks in Treatment: 0 Information Obtained From Patient Constitutional Symptoms (General Health) Medical History: Past Medical History Notes: H/O miscarriage Eyes Medical History: Negative for: Cataracts; Glaucoma; Optic Neuritis Ear/Nose/Mouth/Throat Medical History: Negative for: Chronic sinus problems/congestion; Middle ear problems Hematologic/Lymphatic Medical History: Positive for: Anemia; Sickle Cell Disease Negative for: Hemophilia; Human Immunodeficiency Virus; Lymphedema Respiratory Medical History: Negative for: Aspiration; Asthma; Chronic Obstructive Pulmonary Disease (COPD); Pneumothorax; Sleep Apnea; Tuberculosis Cardiovascular Medical History: Negative for: Angina; Arrhythmia; Congestive Heart Failure; Coronary Artery Disease; Deep Vein Thrombosis; Hypertension; Hypotension; Myocardial Infarction; Peripheral Arterial Disease; Peripheral Venous Disease; Phlebitis; Vasculitis Past Medical History Notes: bradycardia Gastrointestinal Medical History: Negative for: Cirrhosis ; Colitis; Crohns; Hepatitis A; Hepatitis B; Hepatitis C Past Medical History Notes: cholilithiasis Endocrine Medical History: Negative for: Type I Diabetes; Type II Diabetes Genitourinary Medical History: Negative for: End Stage Renal Disease Immunological Medical History: Negative for: Lupus Erythematosus; Raynauds; Scleroderma Integumentary (Skin) Medical History: Negative for: History of Burn Musculoskeletal Medical History: Negative for: Gout; Rheumatoid Arthritis; Osteoarthritis; Osteomyelitis Neurologic Medical History: Positive for: Neuropathy - right foot intermittant Negative for: Dementia; Quadriplegia; Paraplegia; Seizure Disorder Oncologic Medical History: Negative for: Received Chemotherapy; Received Radiation Psychiatric Medical History: Negative for: Anorexia/bulimia; Confinement  Anxiety Immunizations Pneumococcal Vaccine: Received Pneumococcal Vaccination: No Implantable Devices None Hospitalization / Surgery History Type of Hospitalization/Surgery c section x2 left breast lumpectomy iandD right ankle with theraskin Family and Social History Cancer: No; Diabetes: Yes - Mother; Heart Disease: No; Hereditary Spherocytosis: No; Hypertension: No; Kidney Disease: No; Lung Disease: Yes - Mother; Seizures: No; Stroke: No; Thyroid Problems: No; Tuberculosis: No; Never smoker; Marital Status - Married; Alcohol Use: Never; Drug Use: No History; Caffeine Use: Daily; Financial Concerns: No; Food, Clothing or Shelter Needs: No; Support System Lacking: No; Transportation Concerns: No Engineer, maintenance) Signed: 04/17/2021 5:06:47 PM By: Linton Ham MD Signed: 04/17/2021 5:43:30 PM By: Lorrin Jackson Entered By: Lorrin Jackson on 04/17/2021 10:59:07 -------------------------------------------------------------------------------- [place] Details Patient Name: Date of Service: [name], Robertson [name] 04/17/2021 Medical Record Number: [id number] Patient Account Number: [id number] Date of Birth/Sex: Treating RN: 31-Aug-1970 (50 y.o. Sue Lush Primary Care Provider: [name] Other Clinician: Referring Provider: Treating Provider/Extender: Farrel Demark Weeks in Treatment: 0 Diagnosis Coding ICD-10 Codes Code Description [phone] Non-pressure chronic ulcer of other part of right lower leg with other specified severity L97.828 Non-pressure chronic ulcer of other part of left lower leg with other specified severity 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 Facility Procedures CPT4 Code: [id number] Description: 99214 - WOUND CARE VISIT-LEV 4 EST PT Modifier: 25 Quantity: 1 Physician Procedures : CPT4 Code Description Modifier [id number] - WC PHYS LEVEL 5 - NEW PT ICD-10 Diagnosis Description L97.818 Non-pressure chronic ulcer of other  part of right lower leg with other specified severity L97.828 Non-pressure chronic ulcer of other part of  left lower leg with other specified severity 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 Quantity: 1 Electronic Signature(s) Signed: 04/17/2021 5:06:47 PM By: Linton Ham MD Entered By: Linton Ham on 04/17/2021 12:44:04

## 2021-04-18 LAB — CMP14+CBC/D/PLT+FER+RETIC+V...
ALT: 18 IU/L (ref 0–32)
AST: 28 IU/L (ref 0–40)
Albumin/Globulin Ratio: 1.5 (ref 1.2–2.2)
Albumin: 4.3 g/dL (ref 3.8–4.8)
Alkaline Phosphatase: 75 IU/L (ref 44–121)
BUN/Creatinine Ratio: 12 (ref 9–23)
BUN: 6 mg/dL (ref 6–24)
Basophils Absolute: 0.3 10*3/uL — ABNORMAL HIGH (ref 0.0–0.2)
Basos: 2 %
Bilirubin Total: 1.3 mg/dL — ABNORMAL HIGH (ref 0.0–1.2)
CO2: 23 mmol/L (ref 20–29)
Calcium: 10 mg/dL (ref 8.7–10.2)
Chloride: 106 mmol/L (ref 96–106)
Creatinine, Ser: 0.49 mg/dL — ABNORMAL LOW (ref 0.57–1.00)
EOS (ABSOLUTE): 0.9 10*3/uL — ABNORMAL HIGH (ref 0.0–0.4)
Eos: 7 %
Ferritin: 2530 ng/mL — ABNORMAL HIGH (ref 15–150)
Globulin, Total: 2.9 g/dL (ref 1.5–4.5)
Glucose: 88 mg/dL (ref 70–99)
Hematocrit: 30.9 % — ABNORMAL LOW (ref 34.0–46.6)
Hemoglobin: 9.6 g/dL — ABNORMAL LOW (ref 11.1–15.9)
Immature Grans (Abs): 0 10*3/uL (ref 0.0–0.1)
Immature Granulocytes: 0 %
Lymphocytes Absolute: 2.5 10*3/uL (ref 0.7–3.1)
Lymphs: 19 %
MCH: 28.5 pg (ref 26.6–33.0)
MCHC: 31.1 g/dL — ABNORMAL LOW (ref 31.5–35.7)
MCV: 92 fL (ref 79–97)
Monocytes Absolute: 1.5 10*3/uL — ABNORMAL HIGH (ref 0.1–0.9)
Monocytes: 12 %
Neutrophils Absolute: 7.7 10*3/uL — ABNORMAL HIGH (ref 1.4–7.0)
Neutrophils: 60 %
Platelets: 574 10*3/uL — ABNORMAL HIGH (ref 150–450)
Potassium: 4.9 mmol/L (ref 3.5–5.2)
RBC: 3.37 x10E6/uL — ABNORMAL LOW (ref 3.77–5.28)
RDW: 14.9 % (ref 11.7–15.4)
Retic Ct Pct: 1 % (ref 0.6–2.6)
Sodium: 142 mmol/L (ref 134–144)
Total Protein: 7.2 g/dL (ref 6.0–8.5)
Vit D, 25-Hydroxy: 28.2 ng/mL — ABNORMAL LOW (ref 30.0–100.0)
WBC: 13 10*3/uL — ABNORMAL HIGH (ref 3.4–10.8)
eGFR: 115 mL/min/{1.73_m2} (ref >59)

## 2021-04-19 ENCOUNTER — Other Ambulatory Visit: Payer: Self-pay

## 2021-04-19 ENCOUNTER — Telehealth: Payer: Self-pay | Admitting: Family Medicine

## 2021-04-19 ENCOUNTER — Encounter (HOSPITAL_BASED_OUTPATIENT_CLINIC_OR_DEPARTMENT_OTHER): Payer: Medicaid Other | Attending: Internal Medicine | Admitting: Internal Medicine

## 2021-04-19 DIAGNOSIS — L97818 Non-pressure chronic ulcer of other part of right lower leg with other specified severity: Secondary | ICD-10-CM | POA: Insufficient documentation

## 2021-04-19 DIAGNOSIS — L97828 Non-pressure chronic ulcer of other part of left lower leg with other specified severity: Secondary | ICD-10-CM | POA: Insufficient documentation

## 2021-04-19 DIAGNOSIS — D571 Sickle-cell disease without crisis: Secondary | ICD-10-CM | POA: Diagnosis not present

## 2021-04-19 MED ORDER — DEFERIPRONE 500 MG PO TABS: 500.0000 mg | ORAL_TABLET | Freq: Every day | ORAL | 2 refills | Status: DC

## 2021-04-19 NOTE — Progress Notes (Signed)
LANELLE, LINDO ([id number]) Visit Report for 04/19/2021 Arrival Information Details Patient Name: Date of Service: [name] [name] 04/19/2021 11:00 A M Medical Record Number: [id number] Patient Account Number: [id number] Date of Birth/Sex: Treating RN: 07-22-1970 (50 y.o. [name] Primary Care Antonea Gaut: [name] Other Clinician: Referring Brolin Dambrosia: Treating Docie Abramovich/Extender: Farrel Demark Weeks in Treatment: 0 Visit Information History Since Last Visit Added or deleted any medications: No Patient Arrived: Wheel Chair Any new allergies or adverse reactions: No Arrival Time: 11:26 Had a fall or experienced change in No Accompanied By: friend activities of daily living that may affect Transfer Assistance: None risk of falls: Patient Identification Verified: Yes Signs or symptoms of abuse/neglect since last visito No Secondary Verification Process Completed: Yes Hospitalized since last visit: No Patient Requires Transmission-Based Precautions: No Implantable device outside of the clinic excluding No Patient Has Alerts: No cellular tissue based products placed in the center since last visit: Has Dressing in Place as Prescribed: Yes Has Compression in Place as Prescribed: Yes Pain Present Now: Yes Electronic Signature(s) Signed: 04/19/2021 4:15:39 PM By: Baruch Gouty RN, BSN Entered By: Baruch Gouty on 04/19/2021 11:27:02 -------------------------------------------------------------------------------- Encounter Discharge Information Details Patient Name: Date of Service: [name], [name] 04/19/2021 11:00 A M Medical Record Number: [id number] Patient Account Number: [id number] Date of Birth/Sex: Treating RN: 1970-08-03 (50 y.o. [name] Primary Care Barry Culverhouse: [name] Other Clinician: Referring Kemarion Abbey: Treating Deedra Pro/Extender: Farrel Demark Weeks in Treatment: 0 Encounter Discharge Information  Items Post Procedure Vitals Discharge Condition: Stable Temperature (F): 98 Ambulatory Status: Wheelchair Pulse (bpm): 76 Discharge Destination: Home Respiratory Rate (breaths/min): 18 Transportation: Private Auto Blood Pressure (mmHg): 151/80 Accompanied By: friend Schedule Follow-up Appointment: Yes Clinical Summary of Care: Patient Declined Electronic Signature(s) Signed: 04/19/2021 4:15:39 PM By: Baruch Gouty RN, BSN Entered By: Baruch Gouty on 04/19/2021 12:13:07 -------------------------------------------------------------------------------- Patient/Caregiver Education Details Patient Name: Date of Service: [name], [name] 12/1/2022andnbsp11:00 A M Medical Record Number: [id number] Patient Account Number: [id number] Date of Birth/Gender: Treating RN: September 07, 1970 (50 y.o. [name] Primary Care Physician: [name] Other Clinician: Referring Physician: Treating Physician/Extender: Bernette Redbird in Treatment: 0 Education Assessment Education Provided To: Patient Education Topics Provided Wound/Skin Impairment: Methods: Explain/Verbal Responses: Reinforcements needed, State content correctly Electronic Signature(s) Signed: 04/19/2021 4:15:39 PM By: Baruch Gouty RN, BSN Entered By: Baruch Gouty on 04/19/2021 12:11:28 -------------------------------------------------------------------------------- Wound Assessment Details Patient Name: Date of Service: [name], [name] 04/19/2021 11:00 A M Medical Record Number: [id number] Patient Account Number: [id number] Date of Birth/Sex: Treating RN: 24-Oct-1970 (50 y.o. [name] Primary Care Wynn Alldredge: [name] Other Clinician: Referring Austyn Perriello: Treating Littleton Haub/Extender: Farrel Demark Weeks in Treatment: 0 Wound Status Wound Number: 17 Primary Etiology: Sickle Cell Lesion Wound Location: Right, Circumferential Lower Leg Wound Status:  Open Wounding Event: Gradually Appeared Comorbid History: Anemia, Sickle Cell Disease, Neuropathy Date Acquired: 10/05/2012 Weeks Of Treatment: 0 Clustered Wound: Yes Wound Measurements Length: (cm) 16.5 Width: (cm) 14.5 Depth: (cm) 0.1 Area: (cm) 187.907 Volume: (cm) 18.791 % Reduction in Area: 0% % Reduction in Volume: 0% Epithelialization: Small (1-33%) Tunneling: No Undermining: No Wound Description Classification: Full Thickness Without Exposed Support Structures Wound Margin: Distinct, outline attached Exudate Amount: Medium Exudate Type: Serosanguineous Exudate Color: red, brown Foul Odor After Cleansing: No Slough/Fibrino Yes Wound Bed Granulation Amount: Medium (34-66%) Exposed Structure Granulation Quality: Red Fascia Exposed: No Necrotic Amount: Medium (34-66%) Fat Layer (Subcutaneous Tissue) Exposed: Yes Necrotic  Quality: Adherent Slough Tendon Exposed: No Muscle Exposed: No Joint Exposed: No Bone Exposed: No Treatment Notes Wound #17 (Lower Leg) Wound Laterality: Right, Circumferential Cleanser Soap and Water Discharge Instruction: May shower and wash wound with dial antibacterial soap and water prior to dressing change. Wound Cleanser Discharge Instruction: Cleanse the wound with wound cleanser prior to applying a clean dressing using gauze sponges, not tissue or cotton balls. Peri-Wound Care Topical Primary Dressing Hydrofera Blue Ready Foam, 2.5 x2.5 in Discharge Instruction: Apply to wound bed as instructed Santyl Ointment Discharge Instruction: Apply nickel thick amount to wound bed as instructed Secondary Dressing ABD Pad, 5x9 Discharge Instruction: Apply over primary dressing as directed. Secured With Compression Wrap Kerlix Roll 4.5x3.1 (in/yd) Discharge Instruction: Apply Kerlix and Coban compression as directed. Coban Self-Adherent Wrap 4x5 (in/yd) Discharge Instruction: Apply over Kerlix as directed. Compression  Stockings Add-Ons Electronic Signature(s) Signed: 04/19/2021 4:15:39 PM By: Baruch Gouty RN, BSN Entered By: Baruch Gouty on 04/19/2021 12:05:19 -------------------------------------------------------------------------------- Wound Assessment Details Patient Name: Date of Service: [name], [name] 04/19/2021 11:00 A M Medical Record Number: [id number] Patient Account Number: [id number] Date of Birth/Sex: Treating RN: 08-16-1970 (50 y.o. [name] Primary Care Tekla Malachowski: [name] Other Clinician: Referring Octavius Shin: Treating Phila Shoaf/Extender: Farrel Demark Weeks in Treatment: 0 Wound Status Wound Number: 18 Primary Etiology: Sickle Cell Lesion Wound Location: Left, Posterior Ankle Wound Status: Open Wounding Event: Gradually Appeared Comorbid History: Anemia, Sickle Cell Disease, Neuropathy Date Acquired: 12/25/2020 Weeks Of Treatment: 0 Clustered Wound: No Wound Measurements Length: (cm) 6 Width: (cm) 3 Depth: (cm) 0.3 Area: (cm) 14.137 Volume: (cm) 4.241 % Reduction in Area: 0% % Reduction in Volume: 0% Epithelialization: Small (1-33%) Tunneling: No Undermining: No Wound Description Classification: Full Thickness Without Exposed Support Structures Wound Margin: Well defined, not attached Exudate Amount: Medium Exudate Type: Serosanguineous Exudate Color: red, brown Foul Odor After Cleansing: No Slough/Fibrino Yes Wound Bed Granulation Amount: Small (1-33%) Exposed Structure Granulation Quality: Red Fascia Exposed: No Necrotic Amount: Large (67-100%) Fat Layer (Subcutaneous Tissue) Exposed: Yes Necrotic Quality: Adherent Slough Tendon Exposed: No Muscle Exposed: No Joint Exposed: No Bone Exposed: No Treatment Notes Wound #18 (Ankle) Wound Laterality: Left, Posterior Cleanser Soap and Water Discharge Instruction: May shower and wash wound with dial antibacterial soap and water prior to dressing change. Wound  Cleanser Discharge Instruction: Cleanse the wound with wound cleanser prior to applying a clean dressing using gauze sponges, not tissue or cotton balls. Peri-Wound Care Topical Primary Dressing Hydrofera Blue Ready Foam, 2.5 x2.5 in Discharge Instruction: Apply to wound bed as instructed Santyl Ointment Discharge Instruction: Apply nickel thick amount to wound bed as instructed Secondary Dressing ABD Pad, 5x9 Discharge Instruction: Apply over primary dressing as directed. Secured With Compression Wrap Kerlix Roll 4.5x3.1 (in/yd) Discharge Instruction: Apply Kerlix and Coban compression as directed. Coban Self-Adherent Wrap 4x5 (in/yd) Discharge Instruction: Apply over Kerlix as directed. Compression Stockings Add-Ons Electronic Signature(s) Signed: 04/19/2021 4:15:39 PM By: Baruch Gouty RN, BSN Entered By: Baruch Gouty on 04/19/2021 12:06:40 -------------------------------------------------------------------------------- Wound Assessment Details Patient Name: Date of Service: [name], [name] 04/19/2021 11:00 A M Medical Record Number: [id number] Patient Account Number: [id number] Date of Birth/Sex: Treating RN: [birth year] (50 y.o. [name] Primary Care Ulonda Klosowski: Other Clinician: [name] Referring Boyce Keltner: Treating Kaycen Whitworth/Extender: Farrel Demark Weeks in Treatment: 0 Wound Status Wound Number: 19 Primary Etiology: Sickle Cell Lesion Wound Location: Left, Medial Ankle Wound Status: Open Wounding Event: Gradually Appeared Comorbid History: Anemia,  Sickle Cell Disease, Neuropathy Date Acquired: 12/25/2020 Weeks Of Treatment: 0 Clustered Wound: No Wound Measurements Length: (cm) 6 Width: (cm) 4.5 Depth: (cm) 0.7 Area: (cm) 21.206 Volume: (cm) 14.844 % Reduction in Area: 0% % Reduction in Volume: 0% Epithelialization: None Tunneling: No Undermining: No Wound Description Classification: Full Thickness Without Exposed  Support Structures Wound Margin: Distinct, outline attached Exudate Amount: Medium Exudate Type: Serosanguineous Exudate Color: red, brown Foul Odor After Cleansing: No Slough/Fibrino Yes Wound Bed Granulation Amount: Small (1-33%) Exposed Structure Granulation Quality: Red Fascia Exposed: No Necrotic Amount: Large (67-100%) Fat Layer (Subcutaneous Tissue) Exposed: Yes Necrotic Quality: Adherent Slough Tendon Exposed: No Muscle Exposed: No Joint Exposed: No Bone Exposed: No Treatment Notes Wound #19 (Ankle) Wound Laterality: Left, Medial Cleanser Soap and Water Discharge Instruction: May shower and wash wound with dial antibacterial soap and water prior to dressing change. Wound Cleanser Discharge Instruction: Cleanse the wound with wound cleanser prior to applying a clean dressing using gauze sponges, not tissue or cotton balls. Peri-Wound Care Topical Primary Dressing Hydrofera Blue Ready Foam, 2.5 x2.5 in Discharge Instruction: Apply to wound bed as instructed Santyl Ointment Discharge Instruction: Apply nickel thick amount to wound bed as instructed Secondary Dressing ABD Pad, 5x9 Discharge Instruction: Apply over primary dressing as directed. Secured With Compression Wrap Kerlix Roll 4.5x3.1 (in/yd) Discharge Instruction: Apply Kerlix and Coban compression as directed. Coban Self-Adherent Wrap 4x5 (in/yd) Discharge Instruction: Apply over Kerlix as directed. Compression Stockings Add-Ons Electronic Signature(s) Signed: 04/19/2021 4:15:39 PM By: Baruch Gouty RN, BSN Entered By: Baruch Gouty on 04/19/2021 12:07:07 -------------------------------------------------------------------------------- Wound Assessment Details Patient Name: Date of Service: [name], [name] 04/19/2021 11:00 A M Medical Record Number: [id number] Patient Account Number: [id number] Date of Birth/Sex: Treating RN: 10/29/1970 (50 y.o. [name] Primary Care Ralphine Hinks: [name] Other Clinician: Referring Dadrian Ballantine: Treating Xavior Niazi/Extender: Farrel Demark Weeks in Treatment: 0 Wound Status Wound Number: 20 Primary Etiology: Sickle Cell Lesion Wound Location: Left, Lateral Ankle Wound Status: Open Wounding Event: Gradually Appeared Comorbid History: Anemia, Sickle Cell Disease, Neuropathy Date Acquired: 12/25/2020 Weeks Of Treatment: 0 Clustered Wound: No Wound Measurements Length: (cm) 2.5 Width: (cm) 1.3 Depth: (cm) 0.1 Area: (cm) 2.553 Volume: (cm) 0.255 % Reduction in Area: 0% % Reduction in Volume: 0% Epithelialization: None Tunneling: No Undermining: No Wound Description Classification: Full Thickness Without Exposed Support Structures Wound Margin: Distinct, outline attached Exudate Amount: Medium Exudate Type: Serosanguineous Exudate Color: red, brown Foul Odor After Cleansing: No Slough/Fibrino Yes Wound Bed Granulation Amount: Small (1-33%) Exposed Structure Granulation Quality: Red Fascia Exposed: No Necrotic Amount: Large (67-100%) Fat Layer (Subcutaneous Tissue) Exposed: Yes Necrotic Quality: Adherent Slough Tendon Exposed: No Muscle Exposed: No Joint Exposed: No Bone Exposed: No Treatment Notes Wound #20 (Ankle) Wound Laterality: Left, Lateral Cleanser Soap and Water Discharge Instruction: May shower and wash wound with dial antibacterial soap and water prior to dressing change. Wound Cleanser Discharge Instruction: Cleanse the wound with wound cleanser prior to applying a clean dressing using gauze sponges, not tissue or cotton balls. Peri-Wound Care Topical Primary Dressing Hydrofera Blue Ready Foam, 2.5 x2.5 in Discharge Instruction: Apply to wound bed as instructed Santyl Ointment Discharge Instruction: Apply nickel thick amount to wound bed as instructed Secondary Dressing ABD Pad, 5x9 Discharge Instruction: Apply over primary dressing as directed. Secured With Compression  Wrap Kerlix Roll 4.5x3.1 (in/yd) Discharge Instruction: Apply Kerlix and Coban compression as directed. Coban Self-Adherent Wrap 4x5 (in/yd) Discharge Instruction: Apply over Kerlix as directed. Compression  Stockings Environmental education officer) Signed: 04/19/2021 4:15:39 PM By: Baruch Gouty RN, BSN Entered By: Baruch Gouty on 04/19/2021 12:07:30 -------------------------------------------------------------------------------- Vitals Details Patient Name: Date of Service: [name], [name] 04/19/2021 11:00 A M Medical Record Number: [id number] Patient Account Number: [id number] Date of Birth/Sex: Treating RN: 10/25/1970 (50 y.o. [name] Primary Care Ikea Demicco: [name] Other Clinician: Referring Heath Tesler: Treating Lache Dagher/Extender: Farrel Demark Weeks in Treatment: 0 Vital Signs Time Taken: 11:27 Temperature (F): 98 Height (in): 67 Pulse (bpm): 76 Source: Stated Respiratory Rate (breaths/min): 18 Source: Stated Blood Pressure (mmHg): 151/80 Reference Range: 80 - 120 mg / dl Electronic Signature(s) Signed: 04/19/2021 4:15:39 PM By: Baruch Gouty RN, BSN Entered By: Baruch Gouty on 04/19/2021 11:27:31

## 2021-04-19 NOTE — Progress Notes (Signed)
EMILIE, CARP ([id number]) Visit Report for 04/19/2021 Debridement Details Patient Name: Date of Service: [name] 04/19/2021 11:00 A M Medical Record Number: [id number] Patient Account Number: [id number] Date of Birth/Sex: Treating RN: 1971-04-26 (50 y.o. [name] Primary Care Provider: [name] Other Clinician: Referring Provider: Treating Provider/Extender: Farrel Demark Weeks in Treatment: 0 Debridement Performed for Assessment: Wound #17 Right,Circumferential Lower Leg Performed By: Clinician Baruch Gouty, RN Debridement Type: Chemical/Enzymatic/Mechanical Agent Used: Santyl Level of Consciousness (Pre-procedure): Awake and Alert Pre-procedure Verification/Time Out No Taken: Bleeding: None Response to Treatment: Procedure was tolerated well Level of Consciousness (Post- Awake and Alert procedure): Post Debridement Measurements of Total Wound Length: (cm) 16.5 Width: (cm) 14.5 Depth: (cm) 0.1 Volume: (cm) 18.791 Character of Wound/Ulcer Post Debridement: Requires Further Debridement Electronic Signature(s) Signed: 04/19/2021 4:15:39 PM By: Baruch Gouty RN, BSN Signed: 04/19/2021 4:34:24 PM By: Linton Ham MD Entered By: Baruch Gouty on 04/19/2021 12:08:54 -------------------------------------------------------------------------------- Debridement Details Patient Name: Date of Service: [name], [name] 04/19/2021 11:00 A M Medical Record Number: [id number] Patient Account Number: [id number] Date of Birth/Sex: Treating RN: 11-08-1970 (50 y.o. [name] Primary Care Provider: [name] Other Clinician: Referring Provider: Treating Provider/Extender: Farrel Demark Weeks in Treatment: 0 Debridement Performed for Assessment: Wound #18 Left,Posterior Ankle Performed By: Clinician Baruch Gouty, RN Debridement Type: Chemical/Enzymatic/Mechanical Agent Used: Santyl Level of  Consciousness (Pre-procedure): Awake and Alert Pre-procedure Verification/Time Out No Taken: Bleeding: None Response to Treatment: Procedure was tolerated well Level of Consciousness (Post- Awake and Alert procedure): Post Debridement Measurements of Total Wound Length: (cm) 6 Width: (cm) 3 Depth: (cm) 0.3 Volume: (cm) 4.241 Character of Wound/Ulcer Post Debridement: Requires Further Debridement Electronic Signature(s) Signed: 04/19/2021 4:15:39 PM By: Baruch Gouty RN, BSN Signed: 04/19/2021 4:34:24 PM By: Linton Ham MD Entered By: Baruch Gouty on 04/19/2021 12:09:27 -------------------------------------------------------------------------------- Debridement Details Patient Name: Date of Service: [name], [name] 04/19/2021 11:00 A M Medical Record Number: [id number] Patient Account Number: [id number] Date of Birth/Sex: Treating RN: 12/31/1970 (50 y.o. [name] Primary Care Provider: [name] Other Clinician: Referring Provider: Treating Provider/Extender: Farrel Demark Weeks in Treatment: 0 Debridement Performed for Assessment: Wound #19 Left,Medial Ankle Performed By: Clinician Baruch Gouty, RN Debridement Type: Chemical/Enzymatic/Mechanical Agent Used: Santyl Level of Consciousness (Pre-procedure): Awake and Alert Pre-procedure Verification/Time Out No Taken: Bleeding: None Response to Treatment: Procedure was tolerated well Level of Consciousness (Post- Awake and Alert procedure): Post Debridement Measurements of Total Wound Length: (cm) 6 Width: (cm) 4.5 Depth: (cm) 0.7 Volume: (cm) 14.844 Character of Wound/Ulcer Post Debridement: Requires Further Debridement Electronic Signature(s) Signed: 04/19/2021 4:15:39 PM By: Baruch Gouty RN, BSN Signed: 04/19/2021 4:34:24 PM By: Linton Ham MD Entered By: Baruch Gouty on 04/19/2021  12:10:06 -------------------------------------------------------------------------------- Debridement Details Patient Name: Date of Service: [name], [name] 04/19/2021 11:00 A M Medical Record Number: [id number] Patient Account Number: [id number] Date of Birth/Sex: Treating RN: 1971-02-17 (50 y.o. [name] Primary Care Provider: [name] Other Clinician: Referring Provider: Treating Provider/Extender: Farrel Demark Weeks in Treatment: 0 Debridement Performed for Assessment: Wound #20 Left,Lateral Ankle Performed By: Clinician Baruch Gouty, RN Debridement Type: Chemical/Enzymatic/Mechanical Agent Used: Santyl Level of Consciousness (Pre-procedure): Awake and Alert Pre-procedure Verification/Time Out No Taken: Bleeding: None Response to Treatment: Procedure was tolerated well Level of Consciousness (Post- Awake and Alert procedure): Post Debridement Measurements of Total Wound Length: (cm) 2.5 Width: (cm) 1.3 Depth: (cm) 0.1 Volume: (cm) 0.255 Character  of Wound/Ulcer Post Debridement: Requires Further Debridement Electronic Signature(s) Signed: 04/19/2021 4:15:39 PM By: Baruch Gouty RN, BSN Signed: 04/19/2021 4:34:24 PM By: Linton Ham MD Entered By: Baruch Gouty on 04/19/2021 12:10:46 -------------------------------------------------------------------------------- SuperBill Details Patient Name: Date of Service: [place], [name] 04/19/2021 Medical Record Number: [id number] Patient Account Number: [id number] Date of Birth/Sex: Treating RN: 1971-03-10 (50 y.o. [name] Primary Care Provider: [name] Other Clinician: Referring Provider: Treating Provider/Extender: Farrel Demark Weeks in Treatment: 0 Diagnosis Coding ICD-10 Codes Code Description [phone] Non-pressure chronic ulcer of other part of right lower leg with other specified severity L97.828 Non-pressure chronic ulcer of  other part of left lower leg with other specified severity 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 Facility Procedures CPT4 Code: [id number] Description: 38182 - DEBRIDE W/O ANES NON SELECT Modifier: Quantity: 1 Electronic Signature(s) Signed: 04/19/2021 4:15:39 PM By: Baruch Gouty RN, BSN Signed: 04/19/2021 4:34:24 PM By: Linton Ham MD Entered By: Baruch Gouty on 04/19/2021 12:13:36

## 2021-04-19 NOTE — Telephone Encounter (Signed)
[name] is a 50 year old female with a medical history significant for sickle cell disease, nonhealing lower extremity ulcers, and history of anemia of chronic disease that presented for follow-up of chronic disease.  Review laboratory values, ferritin level above 2000 which is indicative of iro patient's hemoglobin remains above 8, it is 9.6 g/dL.  We will recheck in 2 weeks as scheduled.   [name]  APRN, MSN, FNP-C Patient [place] [address] [place], [place] [address] [phone]

## 2021-04-20 NOTE — Telephone Encounter (Signed)
Patient notified of results, verbally understood. No additional questions or concerns.

## 2021-04-24 ENCOUNTER — Encounter (HOSPITAL_BASED_OUTPATIENT_CLINIC_OR_DEPARTMENT_OTHER): Payer: Medicaid Other | Admitting: Internal Medicine

## 2021-04-24 ENCOUNTER — Other Ambulatory Visit: Payer: Self-pay

## 2021-04-24 DIAGNOSIS — L97818 Non-pressure chronic ulcer of other part of right lower leg with other specified severity: Secondary | ICD-10-CM | POA: Diagnosis not present

## 2021-04-24 NOTE — Progress Notes (Signed)
JADZIA, IBSEN ([id number]) Visit Report for 04/24/2021 Arrival Information Details Patient Name: Date of Service: [name] 04/24/2021 10:00 A M Medical Record Number: [id number] Patient Account Number: [id number] Date of Birth/Sex: Treating RN: 1970/12/23 (50 y.o. F) Primary Care Judine Arciniega: [name] Other Clinician: Referring Loyce Flaming: Treating Zamarion Longest/Extender: Bernette Redbird in Treatment: 1 Visit Information History Since Last Visit Added or deleted any medications: No Patient Arrived: Wheel Chair Any new allergies or adverse reactions: No Arrival Time: 10:07 Had a fall or experienced change in No Accompanied By: friend activities of daily living that may affect Transfer Assistance: Manual risk of falls: Patient Identification Verified: Yes Signs or symptoms of abuse/neglect since last visito No Secondary Verification Process Completed: Yes Hospitalized since last visit: No Patient Requires Transmission-Based Precautions: No Implantable device outside of the clinic excluding No Patient Has Alerts: No cellular tissue based products placed in the center since last visit: Has Dressing in Place as Prescribed: Yes Pain Present Now: Yes Electronic Signature(s) Signed: 04/24/2021 2:27:55 PM By: Sandre Kitty Entered By: Sandre Kitty on 04/24/2021 10:08:14 -------------------------------------------------------------------------------- Encounter Discharge Information Details Patient Name: Date of Service: [name], [name] 04/24/2021 10:00 A M Medical Record Number: [id number] Patient Account Number: [id number] Date of Birth/Sex: Treating RN: [birth year] (50 y.o. Sue Lush Primary Care Barbar Brede: [name] Other Clinician: Referring Brylen Wagar: Treating Valmore Arabie/Extender: Bernette Redbird in Treatment: 1 Encounter Discharge Information Items Post Procedure Vitals Discharge Condition:  Stable Temperature (F): 98.1 Ambulatory Status: Wheelchair Pulse (bpm): 69 Discharge Destination: Home Respiratory Rate (breaths/min): 18 Transportation: Private Auto Blood Pressure (mmHg): 122/74 Accompanied By: Denman George Schedule Follow-up Appointment: Yes Clinical Summary of Care: Provided on 04/24/2021 Form Type Recipient Paper Patient Patient Electronic Signature(s) Signed: 04/24/2021 3:40:48 PM By: Lorrin Jackson Entered By: Lorrin Jackson on 04/24/2021 11:14:52 -------------------------------------------------------------------------------- Lower Extremity Assessment Details Patient Name: Date of Service: [name], Gratton [name] 04/24/2021 10:00 A M Medical Record Number: [id number] Patient Account Number: [id number] Date of Birth/Sex: Treating RN: May 05, 1971 (50 y.o. Sue Lush Primary Care Quamesha Mullet: [name] Other Clinician: Referring Dereon Williamsen: Treating Alauna Hayden/Extender: Farrel Demark Weeks in Treatment: 1 Edema Assessment Assessed: Shirlyn Goltz: Yes] [Right: Yes] Edema: [Left: No] [Right: No] Calf Left: Right: Point of Measurement: 33 cm From Medial Instep 24 cm 25 cm Ankle Left: Right: Point of Measurement: 10 cm From Medial Instep 17.4 cm 20 cm Vascular Assessment Pulses: Dorsalis Pedis Palpable: [Left:Yes] [Right:Yes] Electronic Signature(s) Signed: 04/24/2021 3:40:48 PM By: Lorrin Jackson Entered By: Lorrin Jackson on 04/24/2021 10:15:51 -------------------------------------------------------------------------------- Multi Wound Chart Details Patient Name: Date of Service: [name], [name] 04/24/2021 10:00 A M Medical Record Number: [id number] Patient Account Number: [id number] Date of Birth/Sex: Treating RN: Oct 04, 1970 (50 y.o. F) Primary Care Nicholad Kautzman: [name] Other Clinician: Referring Niklaus Mamaril: Treating Marielis Samara/Extender: Farrel Demark Weeks in Treatment: 1 Vital Signs Height(in): 67 Pulse(bpm):  69 Weight(lbs): Blood Pressure(mmHg): 122/74 Body Mass Index(BMI): Temperature(F): 98.1 Respiratory Rate(breaths/min): 18 Photos: [17:Right, Circumferential Lower Leg] [18:Left, Posterior Ankle] [19:Left, Medial Ankle] Wound Location: [17:Gradually Appeared] [18:Gradually Appeared] [19:Gradually Appeared] Wounding Event: [17:Sickle Cell Lesion] [18:Sickle Cell Lesion] [19:Sickle Cell Lesion] Primary Etiology: [17:Anemia, Sickle Cell Disease,] [18:Anemia, Sickle Cell Disease,] [19:Anemia, Sickle Cell Disease,] Comorbid History: [17:Neuropathy 10/05/2012] [18:Neuropathy 12/25/2020] [19:Neuropathy 12/25/2020] Date Acquired: [17:1] [18:1] [19:1] Weeks of Treatment: [17:Open] [18:Open] [19:Open] Wound Status: [17:Yes] [18:No] [19:No] Clustered Wound: [17:12x17x0.5] [18:5.1x2.5x0.3] [19:5.8x4x0.4] Measurements L x W x D (cm) [17:160.221] [18:10.014] [19:18.221] A (cm) : rea [17:80.111] [  18:3.004] [19:7.288] Volume (cm) : [17:14.70%] [18:29.20%] [19:14.10%] % Reduction in A [17:rea: -326.30%] [18:29.20%] [19:50.90%] % Reduction in Volume: [17:Full Thickness Without Exposed] [18:Full Thickness Without Exposed] [19:Full Thickness Without Exposed] Classification: [17:Support Structures Medium] [18:Support Structures Medium] [19:Support Structures Medium] Exudate A mount: [17:Serosanguineous] [18:Serosanguineous] [19:Serosanguineous] Exudate Type: [17:red, brown] [18:red, brown] [19:red, brown] Exudate Color: [17:Distinct, outline attached] [18:Well defined, not attached] [19:Distinct, outline attached] Wound Margin: [17:Medium (34-66%)] [18:Medium (34-66%)] [19:Medium (34-66%)] Granulation A mount: [17:Red] [18:Red] [19:Red] Granulation Quality: [17:Medium (34-66%)] [18:Medium (34-66%)] [19:Medium (34-66%)] Necrotic A mount: [17:Fat Layer (Subcutaneous Tissue): Yes Fat Layer (Subcutaneous Tissue): Yes Fat Layer (Subcutaneous Tissue): Yes] Exposed Structures: [17:Fascia: No Tendon: No Muscle: No  Joint: No Bone: No Small (1-33%)] [18:Fascia: No Tendon: No Muscle: No Joint: No Bone: No Small (1-33%)] [19:Fascia: No Tendon: No Muscle: No Joint: No Bone: No Small (1-33%)] Epithelialization: [17:Chemical/Enzymatic/Mechanical] [18:Chemical/Enzymatic/Mechanical] [19:Chemical/Enzymatic/Mechanical] Debridement: Pre-procedure Verification/Time Out 10:43 [18:10:43] [19:10:43] Taken: [17:Lidocaine 5% topical ointment] [18:Lidocaine 5% topical ointment] [19:Lidocaine 5% topical ointment] Pain Control: [17:N/A] [18:N/A] [19:N/A] Instrument: [17:None] [18:None] [19:None] Bleeding: [17:Procedure was tolerated well] [18:Procedure was tolerated well] [19:Procedure was tolerated well] Debridement Treatment Response: [17:12x17x0.5] [18:5.1x2.5x0.3] [19:5.8x4x0.4] Post Debridement Measurements L x W x D (cm) [17:80.111] [18:3.004] [19:7.288] Post Debridement Volume: (cm) [17:Debridement] [18:Debridement] [19:Debridement] Wound Number: 20 N/A N/A Photos: N/A N/A Left, Lateral Ankle N/A N/A Wound Location: Gradually Appeared N/A N/A Wounding Event: Sickle Cell Lesion N/A N/A Primary Etiology: Anemia, Sickle Cell Disease, N/A N/A Comorbid History: Neuropathy 12/25/2020 N/A N/A Date Acquired: 1 N/A N/A Weeks of Treatment: Open N/A N/A Wound Status: No N/A N/A Clustered Wound: 2.2x1.3x0.1 N/A N/A Measurements L x W x D (cm) 2.246 N/A N/A A (cm) : rea 0.225 N/A N/A Volume (cm) : 12.00% N/A N/A % Reduction in Area: 11.80% N/A N/A % Reduction in Volume: Full Thickness Without Exposed N/A N/A Classification: Support Structures Medium N/A N/A Exudate Amount: Serosanguineous N/A N/A Exudate Type: red, brown N/A N/A Exudate Color: Distinct, outline attached N/A N/A Wound Margin: Medium (34-66%) N/A N/A Granulation Amount: Red N/A N/A Granulation Quality: Medium (34-66%) N/A N/A Necrotic Amount: Fat Layer (Subcutaneous Tissue): Yes N/A N/A Exposed Structures: Fascia: No Tendon:  No Muscle: No Joint: No Bone: No None N/A N/A Epithelialization: Chemical/Enzymatic/Mechanical N/A N/A Debridement: Pre-procedure Verification/Time Out 10:43 N/A N/A Taken: Lidocaine 5% topical ointment N/A N/A Pain Control: N/A N/A N/A Instrument: None N/A N/A Bleeding: Procedure was tolerated well N/A N/A Debridement Treatment Response: 2.2x1.3x0.1 N/A N/A Post Debridement Measurements L x W x D (cm) 0.225 N/A N/A Post Debridement Volume: (cm) Debridement N/A N/A Procedures Performed: Treatment Notes Wound #17 (Lower Leg) Wound Laterality: Right, Circumferential Cleanser Soap and Water Discharge Instruction: May shower and wash wound with dial antibacterial soap and water prior to dressing change. Wound Cleanser Discharge Instruction: Cleanse the wound with wound cleanser prior to applying a clean dressing using gauze sponges, not tissue or cotton balls. Peri-Wound Care Topical Primary Dressing Hydrofera Blue Ready Foam, 2.5 x2.5 in Discharge Instruction: Apply to wound bed as instructed Santyl Ointment Discharge Instruction: Apply nickel thick amount to wound bed as instructed Secondary Dressing ABD Pad, 5x9 Discharge Instruction: Apply over primary dressing as directed. Secured With Compression Wrap Kerlix Roll 4.5x3.1 (in/yd) Discharge Instruction: Apply Kerlix and Coban compression as directed. Coban Self-Adherent Wrap 4x5 (in/yd) Discharge Instruction: Apply over Kerlix as directed. Compression Stockings Add-Ons Wound #18 (Ankle) Wound Laterality: Left, Posterior Cleanser Soap and Water Discharge Instruction: May shower and wash wound with dial antibacterial  soap and water prior to dressing change. Wound Cleanser Discharge Instruction: Cleanse the wound with wound cleanser prior to applying a clean dressing using gauze sponges, not tissue or cotton balls. Peri-Wound Care Topical Primary Dressing Hydrofera Blue Ready Foam, 2.5 x2.5 in Discharge  Instruction: Apply to wound bed as instructed Santyl Ointment Discharge Instruction: Apply nickel thick amount to wound bed as instructed Secondary Dressing ABD Pad, 5x9 Discharge Instruction: Apply over primary dressing as directed. Secured With Compression Wrap Kerlix Roll 4.5x3.1 (in/yd) Discharge Instruction: Apply Kerlix and Coban compression as directed. Coban Self-Adherent Wrap 4x5 (in/yd) Discharge Instruction: Apply over Kerlix as directed. Compression Stockings Add-Ons Wound #19 (Ankle) Wound Laterality: Left, Medial Cleanser Soap and Water Discharge Instruction: May shower and wash wound with dial antibacterial soap and water prior to dressing change. Wound Cleanser Discharge Instruction: Cleanse the wound with wound cleanser prior to applying a clean dressing using gauze sponges, not tissue or cotton balls. Peri-Wound Care Topical Primary Dressing Hydrofera Blue Ready Foam, 2.5 x2.5 in Discharge Instruction: Apply to wound bed as instructed Santyl Ointment Discharge Instruction: Apply nickel thick amount to wound bed as instructed Secondary Dressing ABD Pad, 5x9 Discharge Instruction: Apply over primary dressing as directed. Secured With Compression Wrap Kerlix Roll 4.5x3.1 (in/yd) Discharge Instruction: Apply Kerlix and Coban compression as directed. Coban Self-Adherent Wrap 4x5 (in/yd) Discharge Instruction: Apply over Kerlix as directed. Compression Stockings Add-Ons Wound #20 (Ankle) Wound Laterality: Left, Lateral Cleanser Soap and Water Discharge Instruction: May shower and wash wound with dial antibacterial soap and water prior to dressing change. Wound Cleanser Discharge Instruction: Cleanse the wound with wound cleanser prior to applying a clean dressing using gauze sponges, not tissue or cotton balls. Peri-Wound Care Topical Primary Dressing Hydrofera Blue Ready Foam, 2.5 x2.5 in Discharge Instruction: Apply to wound bed as instructed Santyl  Ointment Discharge Instruction: Apply nickel thick amount to wound bed as instructed Secondary Dressing ABD Pad, 5x9 Discharge Instruction: Apply over primary dressing as directed. Secured With Compression Wrap Kerlix Roll 4.5x3.1 (in/yd) Discharge Instruction: Apply Kerlix and Coban compression as directed. Coban Self-Adherent Wrap 4x5 (in/yd) Discharge Instruction: Apply over Kerlix as directed. Compression Stockings Add-Ons Electronic Signature(s) Signed: 04/24/2021 4:21:48 PM By: Linton Ham MD Entered By: Linton Ham on 04/24/2021 11:42:45 -------------------------------------------------------------------------------- Multi-Disciplinary Care Plan Details Patient Name: Date of Service: [name], [name] 04/24/2021 10:00 A M Medical Record Number: [id number] Patient Account Number: [id number] Date of Birth/Sex: Treating RN: 1970/07/14 (50 y.o. Sue Lush Primary Care Cynithia Hakimi: [name] Other Clinician: Referring Shogo Larkey: Treating Deland Slocumb/Extender: Farrel Demark Weeks in Treatment: 1 Active Inactive Wound/Skin Impairment Nursing Diagnoses: Impaired tissue integrity Goals: Patient/caregiver will verbalize understanding of skin care regimen Date Initiated: 04/17/2021 Target Resolution Date: 05/15/2021 Goal Status: Active Ulcer/skin breakdown will have a volume reduction of 30% by week 4 Date Initiated: 04/17/2021 Target Resolution Date: 05/15/2021 Goal Status: Active Interventions: Assess patient/caregiver ability to obtain necessary supplies Assess patient/caregiver ability to perform ulcer/skin care regimen upon admission and as needed Assess ulceration(s) every visit Provide education on ulcer and skin care Treatment Activities: Topical wound management initiated : 04/17/2021 Notes: Electronic Signature(s) Signed: 04/24/2021 3:40:48 PM By: Lorrin Jackson Entered By: Lorrin Jackson on 04/24/2021  10:14:58 -------------------------------------------------------------------------------- Pain Assessment Details Patient Name: Date of Service: [name], [name] 04/24/2021 10:00 A M Medical Record Number: [id number] Patient Account Number: [id number] Date of Birth/Sex: Treating RN: 1970-08-15 (50 y.o. F) Primary Care Evangeline Utley: Other Clinician: [name] Referring Kellis Topete: Treating Missael Ferrari/Extender: Dellia Nims  [name] Brookes, Lovett Sox Weeks in Treatment: 1 Active Problems Location of Pain Severity and Description of Pain Patient Has Paino Yes Site Locations Rate the pain. Current Pain Level: 6 Pain Management and Medication Current Pain Management: Electronic Signature(s) Signed: 04/24/2021 2:27:55 PM By: Sandre Kitty Entered By: Sandre Kitty on 04/24/2021 10:08:37 -------------------------------------------------------------------------------- Patient/Caregiver Education Details Patient Name: Date of Service: [name], [name] 12/6/2022andnbsp10:00 A M Medical Record Number: [id number] Patient Account Number: [id number] Date of Birth/Gender: Treating RN: [birth year] (50 y.o. Sue Lush Primary Care Physician: [name] Other Clinician: Referring Physician: Treating Physician/Extender: Bernette Redbird in Treatment: 1 Education Assessment Education Provided To: Patient Education Topics Provided Venous: Methods: Explain/Verbal, Printed Responses: State content correctly Wound/Skin Impairment: Methods: Explain/Verbal, Printed Responses: State content correctly Electronic Signature(s) Signed: 04/24/2021 3:40:48 PM By: Lorrin Jackson Entered By: Lorrin Jackson on 04/24/2021 10:15:19 -------------------------------------------------------------------------------- Wound Assessment Details Patient Name: Date of Service: [name], [name] 04/24/2021 10:00 A M Medical Record Number: [id number] Patient Account Number:  [id number] Date of Birth/Sex: Treating RN: 03/25/1971 (50 y.o. Sue Lush Primary Care Jaye Polidori: [name] Other Clinician: Referring Aliayah Tyer: Treating Makenzee Choudhry/Extender: Farrel Demark Weeks in Treatment: 1 Wound Status Wound Number: 17 Primary Etiology: Sickle Cell Lesion Wound Location: Right, Circumferential Lower Leg Wound Status: Open Wounding Event: Gradually Appeared Comorbid History: Anemia, Sickle Cell Disease, Neuropathy Date Acquired: 10/05/2012 Weeks Of Treatment: 1 Clustered Wound: Yes Photos Wound Measurements Length: (cm) 12 Width: (cm) 17 Depth: (cm) 0.5 Area: (cm) 160.221 Volume: (cm) 80.111 % Reduction in Area: 14.7% % Reduction in Volume: -326.3% Epithelialization: Small (1-33%) Tunneling: No Undermining: No Wound Description Classification: Full Thickness Without Exposed Support Structures Wound Margin: Distinct, outline attached Exudate Amount: Medium Exudate Type: Serosanguineous Exudate Color: red, brown Foul Odor After Cleansing: No Slough/Fibrino Yes Wound Bed Granulation Amount: Medium (34-66%) Exposed Structure Granulation Quality: Red Fascia Exposed: No Necrotic Amount: Medium (34-66%) Fat Layer (Subcutaneous Tissue) Exposed: Yes Necrotic Quality: Adherent Slough Tendon Exposed: No Muscle Exposed: No Joint Exposed: No Bone Exposed: No Treatment Notes Wound #17 (Lower Leg) Wound Laterality: Right, Circumferential Cleanser Soap and Water Discharge Instruction: May shower and wash wound with dial antibacterial soap and water prior to dressing change. Wound Cleanser Discharge Instruction: Cleanse the wound with wound cleanser prior to applying a clean dressing using gauze sponges, not tissue or cotton balls. Peri-Wound Care Topical Primary Dressing Hydrofera Blue Ready Foam, 2.5 x2.5 in Discharge Instruction: Apply to wound bed as instructed Santyl Ointment Discharge Instruction: Apply nickel thick  amount to wound bed as instructed Secondary Dressing ABD Pad, 5x9 Discharge Instruction: Apply over primary dressing as directed. Secured With Compression Wrap Kerlix Roll 4.5x3.1 (in/yd) Discharge Instruction: Apply Kerlix and Coban compression as directed. Coban Self-Adherent Wrap 4x5 (in/yd) Discharge Instruction: Apply over Kerlix as directed. Compression Stockings Add-Ons Electronic Signature(s) Signed: 04/24/2021 2:27:55 PM By: Sandre Kitty Signed: 04/24/2021 3:40:48 PM By: Lorrin Jackson Entered By: Sandre Kitty on 04/24/2021 10:32:47 -------------------------------------------------------------------------------- Wound Assessment Details Patient Name: Date of Service: [name], [name] 04/24/2021 10:00 A M Medical Record Number: [id number] Patient Account Number: [id number] Date of Birth/Sex: Treating RN: Apr 24, 1971 (50 y.o. Sue Lush Primary Care Mumtaz Lovins: [name] Other Clinician: Referring Manav Pierotti: Treating Vincent Ehrler/Extender: Farrel Demark Weeks in Treatment: 1 Wound Status Wound Number: 18 Primary Etiology: Sickle Cell Lesion Wound Location: Left, Posterior Ankle Wound Status: Open Wounding Event: Gradually Appeared Comorbid History: Anemia, Sickle Cell Disease, Neuropathy Date Acquired: 12/25/2020 Weeks Of  Treatment: 1 Clustered Wound: No Photos Wound Measurements Length: (cm) 5.1 Width: (cm) 2.5 Depth: (cm) 0.3 Area: (cm) 10.014 Volume: (cm) 3.004 Wound Description Classification: Full Thickness Without Exposed Support Structures Wound Margin: Well defined, not attached Exudate Amount: Medium Exudate Type: Serosanguineous Exudate Color: red, brown Foul Odor After Cleansing: Slough/Fibrino % Reduction in Area: 29.2% % Reduction in Volume: 29.2% Epithelialization: Small (1-33%) Tunneling: No Undermining: No No Yes Wound Bed Granulation Amount: Medium (34-66%) Exposed Structure Granulation Quality:  Red Fascia Exposed: No Necrotic Amount: Medium (34-66%) Fat Layer (Subcutaneous Tissue) Exposed: Yes Necrotic Quality: Adherent Slough Tendon Exposed: No Muscle Exposed: No Joint Exposed: No Bone Exposed: No Treatment Notes Wound #18 (Ankle) Wound Laterality: Left, Posterior Cleanser Soap and Water Discharge Instruction: May shower and wash wound with dial antibacterial soap and water prior to dressing change. Wound Cleanser Discharge Instruction: Cleanse the wound with wound cleanser prior to applying a clean dressing using gauze sponges, not tissue or cotton balls. Peri-Wound Care Topical Primary Dressing Hydrofera Blue Ready Foam, 2.5 x2.5 in Discharge Instruction: Apply to wound bed as instructed Santyl Ointment Discharge Instruction: Apply nickel thick amount to wound bed as instructed Secondary Dressing ABD Pad, 5x9 Discharge Instruction: Apply over primary dressing as directed. Secured With Compression Wrap Kerlix Roll 4.5x3.1 (in/yd) Discharge Instruction: Apply Kerlix and Coban compression as directed. Coban Self-Adherent Wrap 4x5 (in/yd) Discharge Instruction: Apply over Kerlix as directed. Compression Stockings Add-Ons Electronic Signature(s) Signed: 04/24/2021 2:27:55 PM By: Sandre Kitty Signed: 04/24/2021 3:40:48 PM By: Lorrin Jackson Entered By: Sandre Kitty on 04/24/2021 10:33:16 -------------------------------------------------------------------------------- Wound Assessment Details Patient Name: Date of Service: [name], [name] 04/24/2021 10:00 A M Medical Record Number: [id number] Patient Account Number: [id number] Date of Birth/Sex: Treating RN: 1970-05-31 (50 y.o. Sue Lush Primary Care Romilda Proby: [name] Other Clinician: Referring [name]: Treating Monique Hefty/Extender: Farrel Demark Weeks in Treatment: 1 Wound Status Wound Number: 19 Primary Etiology: Sickle Cell Lesion Wound Location: Left, Medial  Ankle Wound Status: Open Wounding Event: Gradually Appeared Comorbid History: Anemia, Sickle Cell Disease, Neuropathy Date Acquired: 12/25/2020 Weeks Of Treatment: 1 Clustered Wound: No Photos Wound Measurements Length: (cm) 5.8 Width: (cm) 4 Depth: (cm) 0.4 Area: (cm) 18.221 Volume: (cm) 7.288 % Reduction in Area: 14.1% % Reduction in Volume: 50.9% Epithelialization: Small (1-33%) Tunneling: No Undermining: No Wound Description Classification: Full Thickness Without Exposed Support Structures Wound Margin: Distinct, outline attached Exudate Amount: Medium Exudate Type: Serosanguineous Exudate Color: red, brown Foul Odor After Cleansing: No Slough/Fibrino Yes Wound Bed Granulation Amount: Medium (34-66%) Exposed Structure Granulation Quality: Red Fascia Exposed: No Necrotic Amount: Medium (34-66%) Fat Layer (Subcutaneous Tissue) Exposed: Yes Necrotic Quality: Adherent Slough Tendon Exposed: No Muscle Exposed: No Joint Exposed: No Bone Exposed: No Treatment Notes Wound #19 (Ankle) Wound Laterality: Left, Medial Cleanser Soap and Water Discharge Instruction: May shower and wash wound with dial antibacterial soap and water prior to dressing change. Wound Cleanser Discharge Instruction: Cleanse the wound with wound cleanser prior to applying a clean dressing using gauze sponges, not tissue or cotton balls. Peri-Wound Care Topical Primary Dressing Hydrofera Blue Ready Foam, 2.5 x2.5 in Discharge Instruction: Apply to wound bed as instructed Santyl Ointment Discharge Instruction: Apply nickel thick amount to wound bed as instructed Secondary Dressing ABD Pad, 5x9 Discharge Instruction: Apply over primary dressing as directed. Secured With Compression Wrap Kerlix Roll 4.5x3.1 (in/yd) Discharge Instruction: Apply Kerlix and Coban compression as directed. Coban Self-Adherent Wrap 4x5 (in/yd) Discharge Instruction: Apply over Kerlix as directed. Compression  Stockings Environmental education officer) Signed: 04/24/2021 2:27:55 PM By: Sandre Kitty Signed: 04/24/2021 3:40:48 PM By: Lorrin Jackson Entered By: Sandre Kitty on 04/24/2021 10:33:48 -------------------------------------------------------------------------------- Wound Assessment Details Patient Name: Date of Service: [name], [name] 04/24/2021 10:00 A M Medical Record Number: [id number] Patient Account Number: [id number] Date of Birth/Sex: Treating RN: 16-May-1971 (50 y.o. Sue Lush Primary Care Addelyn Alleman: [name] Other Clinician: Referring Tomie Elko: Treating Campbell Agramonte/Extender: Farrel Demark Weeks in Treatment: 1 Wound Status Wound Number: 20 Primary Etiology: Sickle Cell Lesion Wound Location: Left, Lateral Ankle Wound Status: Open Wounding Event: Gradually Appeared Comorbid History: Anemia, Sickle Cell Disease, Neuropathy Date Acquired: 12/25/2020 Weeks Of Treatment: 1 Clustered Wound: No Photos Wound Measurements Length: (cm) 2.2 Width: (cm) 1.3 Depth: (cm) 0.1 Area: (cm) 2.246 Volume: (cm) 0.225 % Reduction in Area: 12% % Reduction in Volume: 11.8% Epithelialization: None Tunneling: No Undermining: No Wound Description Classification: Full Thickness Without Exposed Support Structures Wound Margin: Distinct, outline attached Exudate Amount: Medium Exudate Type: Serosanguineous Exudate Color: red, brown Foul Odor After Cleansing: No Slough/Fibrino Yes Wound Bed Granulation Amount: Medium (34-66%) Exposed Structure Granulation Quality: Red Fascia Exposed: No Necrotic Amount: Medium (34-66%) Fat Layer (Subcutaneous Tissue) Exposed: Yes Necrotic Quality: Adherent Slough Tendon Exposed: No Muscle Exposed: No Joint Exposed: No Bone Exposed: No Treatment Notes Wound #20 (Ankle) Wound Laterality: Left, Lateral Cleanser Soap and Water Discharge Instruction: May shower and wash wound with dial antibacterial soap and  water prior to dressing change. Wound Cleanser Discharge Instruction: Cleanse the wound with wound cleanser prior to applying a clean dressing using gauze sponges, not tissue or cotton balls. Peri-Wound Care Topical Primary Dressing Hydrofera Blue Ready Foam, 2.5 x2.5 in Discharge Instruction: Apply to wound bed as instructed Santyl Ointment Discharge Instruction: Apply nickel thick amount to wound bed as instructed Secondary Dressing ABD Pad, 5x9 Discharge Instruction: Apply over primary dressing as directed. Secured With Compression Wrap Kerlix Roll 4.5x3.1 (in/yd) Discharge Instruction: Apply Kerlix and Coban compression as directed. Coban Self-Adherent Wrap 4x5 (in/yd) Discharge Instruction: Apply over Kerlix as directed. Compression Stockings Add-Ons Electronic Signature(s) Signed: 04/24/2021 2:27:55 PM By: Sandre Kitty Signed: 04/24/2021 3:40:48 PM By: Lorrin Jackson Entered By: Sandre Kitty on 04/24/2021 10:34:12 -------------------------------------------------------------------------------- Vitals Details Patient Name: Date of Service: [name], [name] 04/24/2021 10:00 A M Medical Record Number: [id number] Patient Account Number: [id number] Date of Birth/Sex: Treating RN: May 10, 1971 (50 y.o. F) Primary Care Ranger Petrich: [name] Other Clinician: Referring Pakou Rainbow: Treating Maryella Abood/Extender: Farrel Demark Weeks in Treatment: 1 Vital Signs Time Taken: 10:08 Temperature (F): 98.1 Height (in): 67 Pulse (bpm): 69 Respiratory Rate (breaths/min): 18 Blood Pressure (mmHg): 122/74 Reference Range: 80 - 120 mg / dl Electronic Signature(s) Signed: 04/24/2021 2:27:55 PM By: Sandre Kitty Entered By: Sandre Kitty on 04/24/2021 56:31:49

## 2021-04-24 NOTE — Progress Notes (Signed)
CITLALLI, WEIKEL ([id number]) Visit Report for 04/24/2021 Debridement Details Patient Name: Date of Service: [name] [name] 04/24/2021 10:00 A M Medical Record Number: [id number] Patient Account Number: [id number] Date of Birth/Sex: Treating RN: 03/17/1971 (50 y.o. Sue Lush Primary Care Provider: [name] Other Clinician: Referring Provider: Treating Provider/Extender: Farrel Demark Weeks in Treatment: 1 Debridement Performed for Assessment: Wound #17 Right,Circumferential Lower Leg Performed By: Physician Ricard Dillon., MD Debridement Type: Chemical/Enzymatic/Mechanical Agent Used: Santyl Level of Consciousness (Pre-procedure): Awake and Alert Pre-procedure Verification/Time Out Yes - 10:43 Taken: Start Time: 10:44 Pain Control: Lidocaine 5% topical ointment Bleeding: None End Time: 10:46 Response to Treatment: Procedure was tolerated well Level of Consciousness (Post- Awake and Alert procedure): Post Debridement Measurements of Total Wound Length: (cm) 12 Width: (cm) 17 Depth: (cm) 0.5 Volume: (cm) 80.111 Character of Wound/Ulcer Post Debridement: Stable Post Procedure Diagnosis Same as Pre-procedure Electronic Signature(s) Signed: 04/24/2021 3:40:48 PM By: Lorrin Jackson Signed: 04/24/2021 4:21:48 PM By: Linton Ham MD Entered By: Lorrin Jackson on 04/24/2021 10:47:05 -------------------------------------------------------------------------------- Debridement Details Patient Name: Date of Service: [name] [name], [name] 04/24/2021 10:00 A M Medical Record Number: [id number] Patient Account Number: [id number] Date of Birth/Sex: Treating RN: 1970-06-09 (50 y.o. Sue Lush Primary Care Provider: [name] Other Clinician: Referring Provider: Treating Provider/Extender: Farrel Demark Weeks in Treatment: 1 Debridement Performed for Assessment: Wound #18 Left,Posterior Ankle Performed By: Physician  Ricard Dillon., MD Debridement Type: Chemical/Enzymatic/Mechanical Agent Used: Santyl Level of Consciousness (Pre-procedure): Awake and Alert Pre-procedure Verification/Time Out Yes - 10:43 Taken: Start Time: 10:44 Pain Control: Lidocaine 5% topical ointment Bleeding: None End Time: 10:46 Response to Treatment: Procedure was tolerated well Level of Consciousness (Post- Awake and Alert procedure): Post Debridement Measurements of Total Wound Length: (cm) 5.1 Width: (cm) 2.5 Depth: (cm) 0.3 Volume: (cm) 3.004 Character of Wound/Ulcer Post Debridement: Stable Post Procedure Diagnosis Same as Pre-procedure Electronic Signature(s) Signed: 04/24/2021 3:40:48 PM By: Lorrin Jackson Signed: 04/24/2021 4:21:48 PM By: Linton Ham MD Entered By: Lorrin Jackson on 04/24/2021 10:47:33 -------------------------------------------------------------------------------- Debridement Details Patient Name: Date of Service: [name] [name], [name] 04/24/2021 10:00 A M Medical Record Number: [id number] Patient Account Number: [id number] Date of Birth/Sex: Treating RN: 1970/11/15 (50 y.o. Sue Lush Primary Care Provider: [name] Other Clinician: Referring Provider: Treating Provider/Extender: Farrel Demark Weeks in Treatment: 1 Debridement Performed for Assessment: Wound #19 Left,Medial Ankle Performed By: Physician Ricard Dillon., MD Debridement Type: Chemical/Enzymatic/Mechanical Agent Used: Santyl Level of Consciousness (Pre-procedure): Awake and Alert Pre-procedure Verification/Time Out Yes - 10:43 Taken: Start Time: 10:44 Pain Control: Lidocaine 5% topical ointment Bleeding: None End Time: 10:46 Response to Treatment: Procedure was tolerated well Level of Consciousness (Post- Awake and Alert procedure): Post Debridement Measurements of Total Wound Length: (cm) 5.8 Width: (cm) 4 Depth: (cm) 0.4 Volume: (cm) 7.288 Character of Wound/Ulcer Post  Debridement: Stable Post Procedure Diagnosis Same as Pre-procedure Electronic Signature(s) Signed: 04/24/2021 3:40:48 PM By: Lorrin Jackson Signed: 04/24/2021 4:21:48 PM By: Linton Ham MD Entered By: Lorrin Jackson on 04/24/2021 10:47:57 -------------------------------------------------------------------------------- Debridement Details Patient Name: Date of Service: [name] [name], [name] 04/24/2021 10:00 A M Medical Record Number: [id number] Patient Account Number: [id number] Date of Birth/Sex: Treating RN: October 29, 1970 (50 y.o. Sue Lush Primary Care Provider: [name] Other Clinician: Referring Provider: Treating Provider/Extender: Farrel Demark Weeks in Treatment: 1 Debridement Performed for Assessment: Wound #20 Left,Lateral Ankle Performed By: Physician Ricard Dillon., MD Debridement Type:  Chemical/Enzymatic/Mechanical Agent Used: Santyl Level of Consciousness (Pre-procedure): Awake and Alert Pre-procedure Verification/Time Out Yes - 10:43 Taken: Start Time: 10:44 Pain Control: Lidocaine 5% topical ointment Bleeding: None End Time: 10:46 Response to Treatment: Procedure was tolerated well Level of Consciousness (Post- Awake and Alert procedure): Post Debridement Measurements of Total Wound Length: (cm) 2.2 Width: (cm) 1.3 Depth: (cm) 0.1 Volume: (cm) 0.225 Character of Wound/Ulcer Post Debridement: Stable Post Procedure Diagnosis Same as Pre-procedure Electronic Signature(s) Signed: 04/24/2021 3:40:48 PM By: Lorrin Jackson Signed: 04/24/2021 4:21:48 PM By: Linton Ham MD Entered By: Lorrin Jackson on 04/24/2021 10:48:22 -------------------------------------------------------------------------------- HPI Details Patient Name: Date of Service: [name] [name], [name] 04/24/2021 10:00 A M Medical Record Number: [id number] Patient Account Number: [id number] Date of Birth/Sex: Treating RN: 19-May-1971 (50 y.o. F) Primary Care Provider:  [name] Other Clinician: Referring Provider: Treating Provider/Extender: Farrel Demark Weeks in Treatment: 1 History of Present Illness Location: medial and lateral ankle region on the right and left medial malleolus Quality: Patient reports experiencing a shooting pain to affected area(s). Severity: Patient states wound(s) are getting worse. Duration: right lower extremity bimalleolar ulcers have been present for approximately 2 years; the rright meedial malleolus ulcer has been there proximally 6 months Timing: Pain in wound is constant (hurts all the time) Context: The wound would happen gradually ssociated Signs and Symptoms: Patient reports having increase discharge. A HPI Description: 50 year old patient with a history of sickle cell anemia who was last seen by me with ulceration of the right lower extremity above the ankle and was referred to Dr. Leland Johns for a surgical debridement as I was unable to do anything in the office due to excruciating pain. At that stage she was referred from the plastic surgery service to dermatology who treated her for a skin infection with doxycycline and then Levaquin and a local antibiotic ointment. I understand the patient has since developed ulceration on the left ankle both medial and lateral and was now referred back to the wound center as dermatology has finished the management. I do not have any notes from the dermatology department Old notes: 50 year old patient with a history of sickle cell anemia, pain bilateral lower extremities, right lower extremity ulcer and has a history of receiving a skin graft( Theraskin) several months ago. She has been visiting the wound center [facility] and was seen by Dr. Dellia Nims and Dr. Leland Johns. after prolonged conservator therapy between July 2016 and January 2017. She had been seen by the plastic surgeon and taken to the OR for debridement and application of Theraskin. She had 3  applications of Theraskin and was then treated with collagen. Prior to that she had a history of similar problems in 2014 and was treated conservatively. Had a reflux study done for the right lower extremity in August 2016 without reflux or DVT . Past medical history significant for sickle cell disease, anemia, leg ulcers, cholelithiasis,and has never been a smoker. Once the patient was discharged on the wound center she says within 2 or 3 weeks the problems recurred and she has been treating it conservatively. since I saw her 3 weeks ago at [facility] she has been unable to get her dressing material but has completed a course of doxycycline. 6/7/ 2017 -- lower extremity venous duplex reflux evaluation was done No evidence of SVT or DVT in the RLL. No venous incompetence in the RLL. No further vascular workup is indicated at this time. She was seen by Dr. [name], on 10/04/2015. She  agreed with the plan of taking her to the OR for debridement and application of theraskin and would also take biopsies to rule out pyoderma gangrenosum. Follow-up note dated May 31 received and she was status post application of Theraskin to multiple ulcers around the right ankle. Pathology did not show evidence of malignancy or pyoderma gangrenosum. She would continue to see as in the wound clinic for further care and see Dr. Leland Johns as needed. The patient brought the biopsy report and it was consistent with stasis ulcer no evidence of malignancy and the comment was that there was some adjacent neovascularization, fibrosis and patchy perivascular chronic inflammation. 11/15/2015 -- today we applied her first application of Theraskin 11/30/15; TheraSkin #2 12/13/2015 -- she is having a lot of pain locally and is here for possible application of a theraskin today. 01/16/2016 -- the patient has significant pain and has noticed despite in spite of all local care and oral pain medication. It is impossible to debride  her in the office. 02/06/2016 -- I do not see any notes from Dr. Iran Planas( the patient has not made a call to the office know as she heard from them) and the only visit to recently was with her PCP Dr. Danella Penton -- I saw her on 01/16/2016 and prescribed 90 tablets of oxycodone 10 mg and did lab work and screening for HIV. the HIV was negative and hemoglobin was 6.3 with a WBC count of 14.9 and hematocrit of 17.8 with platelets of 561. 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 The bimalleolar ulcers to the right lower extremity have been present for approximately 2 years, the left medial malleolus ulcer has been present approximately 6 months. She has followed with Dr.Thimmappa in the past and has had a total of 3 applications of Theraskin (01/2015, 09/2015, 06/17/16). She has also followed with Dr. Con Memos here in the clinic and has received 2 applications of TheraSkin (11/10/15, 11/30/15). The patient does experience chronic, and is not amenable to debridement. 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 She is not currently on any antibiotic therapy and has not been treated with any recently. 07/17/2016 -- was seen by Dr. Iran Planas of plastic surgery who saw her 2 weeks postop application of Theraskin #3. She had removed her dressing and asked her to apply silver alginate on alternate days and follow-up back with the wound center. Future debridements and application of skin substitute would have to be done in the hospital due to her high risk for anesthesia. READMISSION 04/17/2021 Patient is now a 50 year old woman that we have had in this clinic for a prolonged period of time and 2016-2017 and then again for 2 visits in February 2018. At that point she had wounds on the right lower leg predominantly medial. She had also been seen by plastic  surgery Dr. Leland Johns who I believe took her to the OR for operative debridement and application of TheraSkin in 2017. After she left our clinic she was followed for a very prolonged period of time in the wound care center in [facility] who then referred her ultimately to [facility] where she was seen by Dr. Vernona Rieger. Again taken her to the OR for skin grafting which apparently did not take. She had multiple other attempts at dressings although I have not really looked over all of these notes in great detail. She  has not been seen in a wound care center in about a year. She states over the last year in addition to her right lower leg she has developed wounds on the left lower leg quite extensive. She is using Xeroform to all of these wounds without really any improvement. She also has Medicaid which does not cover wound products. The patient has had vascular work-ups in the past including most recently on 03/28/2021 showing biphasic waveforms on the right triphasic at the PTA and biphasic at the dorsalis pedis on the left. She was unable to tolerate any degree of compression to do ABIs. Unfortunately TBI's were also not done. She had venous reflux studies done in 2017. This did not show any evidence of a DVT or SVT and no venous incompetence was noted in the right leg at the time this was the only side with the wound As noted I did not look all over her old records. She apparently had a course of HBO and [facility] although I am not sure what the indication would have been. In any case she developed seizures and terminated treatment earlier. She is generally much more disabled than when we last saw her in clinic. She can no longer walk pretty much wheelchair-bound because predominantly of pain in the left hip. 04/24/2021; the patient tolerated the wraps we put on. We used Santyl and Hydrofera Blue under compression. I brought her back for a nurse visit for a change in dressing. With Medicaid we will have a hard  time getting anything paid for and hence the need for compression. She arrives in clinic with all the wounds looking somewhat better in terms of surface Electronic Signature(s) Signed: 04/24/2021 4:21:48 PM By: Linton Ham MD Entered By: Linton Ham on 04/24/2021 11:46:25 -------------------------------------------------------------------------------- Physical Exam Details Patient Name: Date of Service: [name] [name], [name] 04/24/2021 10:00 A M Medical Record Number: [id number] Patient Account Number: [id number] Date of Birth/Sex: Treating RN: 01-Dec-1970 (50 y.o. F) Primary Care Provider: [name] Other Clinician: Referring Provider: Treating Provider/Extender: Farrel Demark Weeks in Treatment: 1 Constitutional Sitting or standing Blood Pressure is within target range for patient.. Pulse regular and within target range for patient.Marland Kitchen Respirations regular, non-labored and within target range.. Temperature is normal and within the target range for the patient.Marland Kitchen Appears in no distress. Cardiovascular Pedal pulses are palpable. There is not really much edema. No overt signs of chronic venous insufficiency. Notes Wound exam; substantial almost circumferential wound on the right lower leg. No evidence of infection. I used gauze and saline basically to aggressively clean the surface of the wounds and she actually seems to tolerate this. The other major wound is on the left medial ankle she also has smaller areas on the left lateral lower leg. Electronic Signature(s) Signed: 04/24/2021 4:21:48 PM By: Linton Ham MD Entered By: Linton Ham on 04/24/2021 11:49:03 -------------------------------------------------------------------------------- Physician Orders Details Patient Name: Date of Service: [name] [name], [name] 04/24/2021 10:00 A M Medical Record Number: [id number] Patient Account Number: [id number] Date of Birth/Sex: Treating RN: 1971/04/15 (50 y.o. Sue Lush Primary Care Provider: [name] Other Clinician: Referring Provider: Treating Provider/Extender: Farrel Demark Weeks in Treatment: 1 Verbal / Phone Orders: No Diagnosis Coding ICD-10 Coding Code Description L97.818 Non-pressure chronic ulcer of other part of right lower leg with other specified severity L97.828 Non-pressure chronic ulcer of other part of left lower leg with other specified severity 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 Follow-up Appointments ppointment in 1  week. - with Dr. Dellia Nims Return A Nurse Visit: - Friday 12/9 for dressing change Bathing/ Shower/ Hygiene May shower with protection but do not get wound dressing(s) wet. - Can get cast protector bags at [facility] or [facility] Additional Orders / Instructions Follow Nutritious Diet Wound Treatment Wound #17 - Lower Leg Wound Laterality: Right, Circumferential Cleanser: Soap and Water 2 x Per Week/30 Days Discharge Instructions: May shower and wash wound with dial antibacterial soap and water prior to dressing change. Cleanser: Wound Cleanser 2 x Per Week/30 Days Discharge Instructions: Cleanse the wound with wound cleanser prior to applying a clean dressing using gauze sponges, not tissue or cotton balls. Prim Dressing: Hydrofera Blue Ready Foam, 2.5 x2.5 in 2 x Per Week/30 Days ary Discharge Instructions: Apply to wound bed as instructed Prim Dressing: Santyl Ointment 2 x Per Week/30 Days ary Discharge Instructions: Apply nickel thick amount to wound bed as instructed Secondary Dressing: ABD Pad, 5x9 2 x Per Week/30 Days Discharge Instructions: Apply over primary dressing as directed. Compression Wrap: Kerlix Roll 4.5x3.1 (in/yd) 2 x Per Week/30 Days Discharge Instructions: Apply Kerlix and Coban compression as directed. Compression Wrap: Coban Self-Adherent Wrap 4x5 (in/yd) 2 x Per Week/30 Days Discharge Instructions: Apply over Kerlix as directed. Wound #18 - Ankle  Wound Laterality: Left, Posterior Cleanser: Soap and Water 2 x Per Week/30 Days Discharge Instructions: May shower and wash wound with dial antibacterial soap and water prior to dressing change. Cleanser: Wound Cleanser 2 x Per Week/30 Days Discharge Instructions: Cleanse the wound with wound cleanser prior to applying a clean dressing using gauze sponges, not tissue or cotton balls. Prim Dressing: Hydrofera Blue Ready Foam, 2.5 x2.5 in 2 x Per Week/30 Days ary Discharge Instructions: Apply to wound bed as instructed Prim Dressing: Santyl Ointment 2 x Per Week/30 Days ary Discharge Instructions: Apply nickel thick amount to wound bed as instructed Secondary Dressing: ABD Pad, 5x9 2 x Per Week/30 Days Discharge Instructions: Apply over primary dressing as directed. Compression Wrap: Kerlix Roll 4.5x3.1 (in/yd) 2 x Per Week/30 Days Discharge Instructions: Apply Kerlix and Coban compression as directed. Compression Wrap: Coban Self-Adherent Wrap 4x5 (in/yd) 2 x Per Week/30 Days Discharge Instructions: Apply over Kerlix as directed. Wound #19 - Ankle Wound Laterality: Left, Medial Cleanser: Soap and Water 2 x Per Week/30 Days Discharge Instructions: May shower and wash wound with dial antibacterial soap and water prior to dressing change. Cleanser: Wound Cleanser 2 x Per Week/30 Days Discharge Instructions: Cleanse the wound with wound cleanser prior to applying a clean dressing using gauze sponges, not tissue or cotton balls. Prim Dressing: Hydrofera Blue Ready Foam, 2.5 x2.5 in 2 x Per Week/30 Days ary Discharge Instructions: Apply to wound bed as instructed Prim Dressing: Santyl Ointment 2 x Per Week/30 Days ary Discharge Instructions: Apply nickel thick amount to wound bed as instructed Secondary Dressing: ABD Pad, 5x9 2 x Per Week/30 Days Discharge Instructions: Apply over primary dressing as directed. Compression Wrap: Kerlix Roll 4.5x3.1 (in/yd) 2 x Per Week/30 Days Discharge  Instructions: Apply Kerlix and Coban compression as directed. Compression Wrap: Coban Self-Adherent Wrap 4x5 (in/yd) 2 x Per Week/30 Days Discharge Instructions: Apply over Kerlix as directed. Wound #20 - Ankle Wound Laterality: Left, Lateral Cleanser: Soap and Water 2 x Per Week/30 Days Discharge Instructions: May shower and wash wound with dial antibacterial soap and water prior to dressing change. Cleanser: Wound Cleanser 2 x Per Week/30 Days Discharge Instructions: Cleanse the wound with wound cleanser prior to applying a  clean dressing using gauze sponges, not tissue or cotton balls. Prim Dressing: Hydrofera Blue Ready Foam, 2.5 x2.5 in 2 x Per Week/30 Days ary Discharge Instructions: Apply to wound bed as instructed Prim Dressing: Santyl Ointment 2 x Per Week/30 Days ary Discharge Instructions: Apply nickel thick amount to wound bed as instructed Secondary Dressing: ABD Pad, 5x9 2 x Per Week/30 Days Discharge Instructions: Apply over primary dressing as directed. Compression Wrap: Kerlix Roll 4.5x3.1 (in/yd) 2 x Per Week/30 Days Discharge Instructions: Apply Kerlix and Coban compression as directed. Compression Wrap: Coban Self-Adherent Wrap 4x5 (in/yd) 2 x Per Week/30 Days Discharge Instructions: Apply over Kerlix as directed. Electronic Signature(s) Signed: 04/24/2021 3:40:48 PM By: Lorrin Jackson Signed: 04/24/2021 4:21:48 PM By: Linton Ham MD Entered By: Lorrin Jackson on 04/24/2021 10:49:18 -------------------------------------------------------------------------------- Problem List Details Patient Name: Date of Service: [name] [name], [name] 04/24/2021 10:00 A M Medical Record Number: [id number] Patient Account Number: [id number] Date of Birth/Sex: Treating RN: September 12, 1970 (50 y.o. Sue Lush Primary Care Provider: [name] Other Clinician: Referring Provider: Treating Provider/Extender: Farrel Demark Weeks in Treatment: 1 Active  Problems ICD-10 Encounter Code Description Active Date MDM Diagnosis L97.818 Non-pressure chronic ulcer of other part of right lower leg with other specified 04/17/2021 No Yes severity L97.828 Non-pressure chronic ulcer of other part of left lower leg with other specified 04/17/2021 No Yes severity 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 04/17/2021 No Yes Inactive Problems Resolved Problems Electronic Signature(s) Signed: 04/24/2021 4:21:48 PM By: Linton Ham MD Entered By: Linton Ham on 04/24/2021 11:42:34 -------------------------------------------------------------------------------- Progress Note Details Patient Name: Date of Service: [name], [name] 04/24/2021 10:00 A M Medical Record Number: [id number] Patient Account Number: [id number] Date of Birth/Sex: Treating RN: 1970-10-18 (50 y.o. F) Primary Care Provider: [name] Other Clinician: Referring Provider: Treating Provider/Extender: Farrel Demark Weeks in Treatment: 1 Subjective History of Present Illness (HPI) The following HPI elements were documented for the patient's wound: Location: medial and lateral ankle region on the right and left medial malleolus Quality: Patient reports experiencing a shooting pain to affected area(s). Severity: Patient states wound(s) are getting worse. Duration: right lower extremity bimalleolar ulcers have been present for approximately 2 years; the rright meedial malleolus ulcer has been there proximally 6 months Timing: Pain in wound is constant (hurts all the time) Context: The wound would happen gradually Associated Signs and Symptoms: Patient reports having increase discharge. 50 year old patient with a history of sickle cell anemia who was last seen by me with ulceration of the right lower extremity above the ankle and was referred to Dr. Leland Johns for a surgical debridement as I was unable to do anything in the office due to excruciating pain. At  that stage she was referred from the plastic surgery service to dermatology who treated her for a skin infection with doxycycline and then Levaquin and a local antibiotic ointment. I understand the patient has since developed ulceration on the left ankle both medial and lateral and was now referred back to the wound center as dermatology has finished the management. I do not have any notes from the dermatology department Old notes: 50 year old patient with a history of sickle cell anemia, pain bilateral lower extremities, right lower extremity ulcer and has a history of receiving a skin graft( Theraskin) several months ago. She has been visiting the wound center [facility] and was seen by Dr. Dellia Nims and Dr. Leland Johns. after prolonged conservator therapy between July 2016 and January 2017. She had been  seen by the plastic surgeon and taken to the OR for debridement and application of Theraskin. She had 3 applications of Theraskin and was then treated with collagen. Prior to that she had a history of similar problems in 2014 and was treated conservatively. Had a reflux study done for the right lower extremity in August 2016 without reflux or DVT . Past medical history significant for sickle cell disease, anemia, leg ulcers, cholelithiasis,and has never been a smoker. Once the patient was discharged on the wound center she says within 2 or 3 weeks the problems recurred and she has been treating it conservatively. since I saw her 3 weeks ago at [facility] she has been unable to get her dressing material but has completed a course of doxycycline. 6/7/ 2017 -- lower extremity venous duplex reflux evaluation was done oo No evidence of SVT or DVT in the RLL. No venous incompetence in the RLL. No further vascular workup is indicated at this time. She was seen by Dr. [name], on 10/04/2015. She agreed with the plan of taking her to the OR for debridement and application of theraskin and would also  take biopsies to rule out pyoderma gangrenosum. Follow-up note dated May 31 received and she was status post application of Theraskin to multiple ulcers around the right ankle. Pathology did not show evidence of malignancy or pyoderma gangrenosum. She would continue to see as in the wound clinic for further care and see Dr. Leland Johns as needed. The patient brought the biopsy report and it was consistent with stasis ulcer no evidence of malignancy and the comment was that there was some adjacent neovascularization, fibrosis and patchy perivascular chronic inflammation. 11/15/2015 -- today we applied her first application of Theraskin 11/30/15; TheraSkin #2 12/13/2015 -- she is having a lot of pain locally and is here for possible application of a theraskin today. 01/16/2016 -- the patient has significant pain and has noticed despite in spite of all local care and oral pain medication. It is impossible to debride her in the office. 02/06/2016 -- I do not see any notes from Dr. Iran Planas( the patient has not made a call to the office know as she heard from them) and the only visit to recently was with her PCP Dr. Danella Penton -- I saw her on 01/16/2016 and prescribed 90 tablets of oxycodone 10 mg and did lab work and screening for HIV. the HIV was negative and hemoglobin was 6.3 with a WBC count of 14.9 and hematocrit of 17.8 with platelets of 561. 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 The bimalleolar ulcers to the right lower extremity have been present for approximately 2 years, the left medial malleolus ulcer has been present approximately 6 months. She has followed with Dr.Thimmappa in the past and has had a total of 3 applications of Theraskin (01/2015, 09/2015, 06/17/16). She has also followed with Dr. Con Memos here in the clinic and has received 2 applications of TheraSkin (11/10/15,  11/30/15). The patient does experience chronic, and is not amenable to debridement. 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 She is not currently on any antibiotic therapy and has not been treated with any recently. 07/17/2016 -- was seen by Dr. Iran Planas of plastic surgery who saw her 2 weeks postop application of Theraskin #3. She had removed her dressing and asked her to apply silver alginate on  alternate days and follow-up back with the wound center. Future debridements and application of skin substitute would have to be done in the hospital due to her high risk for anesthesia. READMISSION 04/17/2021 Patient is now a 50 year old woman that we have had in this clinic for a prolonged period of time and 2016-2017 and then again for 2 visits in February 2018. At that point she had wounds on the right lower leg predominantly medial. She had also been seen by plastic surgery Dr. Leland Johns who I believe took her to the OR for operative debridement and application of TheraSkin in 2017. After she left our clinic she was followed for a very prolonged period of time in the wound care center in [facility] who then referred her ultimately to [facility] where she was seen by Dr. Vernona Rieger. Again taken her to the OR for skin grafting which apparently did not take. She had multiple other attempts at dressings although I have not really looked over all of these notes in great detail. She has not been seen in a wound care center in about a year. She states over the last year in addition to her right lower leg she has developed wounds on the left lower leg quite extensive. She is using Xeroform to all of these wounds without really any improvement. She also has Medicaid which does not cover wound products. The patient has had vascular work-ups in the past including most recently on 03/28/2021 showing biphasic waveforms on the right triphasic at the PTA and biphasic at the dorsalis  pedis on the left. She was unable to tolerate any degree of compression to do ABIs. Unfortunately TBI's were also not done. She had venous reflux studies done in 2017. This did not show any evidence of a DVT or SVT and no venous incompetence was noted in the right leg at the time this was the only side with the wound As noted I did not look all over her old records. She apparently had a course of HBO and [facility] although I am not sure what the indication would have been. In any case she developed seizures and terminated treatment earlier. She is generally much more disabled than when we last saw her in clinic. She can no longer walk pretty much wheelchair-bound because predominantly of pain in the left hip. 04/24/2021; the patient tolerated the wraps we put on. We used Santyl and Hydrofera Blue under compression. I brought her back for a nurse visit for a change in dressing. With Medicaid we will have a hard time getting anything paid for and hence the need for compression. She arrives in clinic with all the wounds looking somewhat better in terms of surface Objective Constitutional Sitting or standing Blood Pressure is within target range for patient.. Pulse regular and within target range for patient.Marland Kitchen Respirations regular, non-labored and within target range.. Temperature is normal and within the target range for the patient.Marland Kitchen Appears in no distress. Vitals Time Taken: 10:08 AM, Height: 67 in, Temperature: 98.1 F, Pulse: 69 bpm, Respiratory Rate: 18 breaths/min, Blood Pressure: 122/74 mmHg. Cardiovascular Pedal pulses are palpable. There is not really much edema. No overt signs of chronic venous insufficiency. General Notes: Wound exam; substantial almost circumferential wound on the right lower leg. No evidence of infection. I used gauze and saline basically to aggressively clean the surface of the wounds and she actually seems to tolerate this. The other major wound is on the left medial ankle  she also has smaller areas on  the left lateral lower leg. Integumentary (Hair, Skin) Wound #17 status is Open. Original cause of wound was Gradually Appeared. The date acquired was: 10/05/2012. The wound has been in treatment 1 weeks. The wound is located on the Right,Circumferential Lower Leg. The wound measures 12cm length x 17cm width x 0.5cm depth; 160.221cm^2 area and 80.111cm^3 volume. There is Fat Layer (Subcutaneous Tissue) exposed. There is no tunneling or undermining noted. There is a medium amount of serosanguineous drainage noted. The wound margin is distinct with the outline attached to the wound base. There is medium (34-66%) red granulation within the wound bed. There is a medium (34-66%) amount of necrotic tissue within the wound bed including Adherent Slough. Wound #18 status is Open. Original cause of wound was Gradually Appeared. The date acquired was: 12/25/2020. The wound has been in treatment 1 weeks. The wound is located on the Left,Posterior Ankle. The wound measures 5.1cm length x 2.5cm width x 0.3cm depth; 10.014cm^2 area and 3.004cm^3 volume. There is Fat Layer (Subcutaneous Tissue) exposed. There is no tunneling or undermining noted. There is a medium amount of serosanguineous drainage noted. The wound margin is well defined and not attached to the wound base. There is medium (34-66%) red granulation within the wound bed. There is a medium (34-66%) amount of necrotic tissue within the wound bed including Adherent Slough. Wound #19 status is Open. Original cause of wound was Gradually Appeared. The date acquired was: 12/25/2020. The wound has been in treatment 1 weeks. The wound is located on the Left,Medial Ankle. The wound measures 5.8cm length x 4cm width x 0.4cm depth; 18.221cm^2 area and 7.288cm^3 volume. There is Fat Layer (Subcutaneous Tissue) exposed. There is no tunneling or undermining noted. There is a medium amount of serosanguineous drainage noted. The wound margin  is distinct with the outline attached to the wound base. There is medium (34-66%) red granulation within the wound bed. There is a medium (34- 66%) amount of necrotic tissue within the wound bed including Adherent Slough. Wound #20 status is Open. Original cause of wound was Gradually Appeared. The date acquired was: 12/25/2020. The wound has been in treatment 1 weeks. The wound is located on the Left,Lateral Ankle. The wound measures 2.2cm length x 1.3cm width x 0.1cm depth; 2.246cm^2 area and 0.225cm^3 volume. There is Fat Layer (Subcutaneous Tissue) exposed. There is no tunneling or undermining noted. There is a medium amount of serosanguineous drainage noted. The wound margin is distinct with the outline attached to the wound base. There is medium (34-66%) red granulation within the wound bed. There is a medium (34- 66%) amount of necrotic tissue within the wound bed including Adherent Slough. 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 There was a Chemical/Enzymatic/Mechanical debridement performed by Ricard Dillon., MD. after achieving pain control using Lidocaine 5% topical ointment. Agent used was [employer]. A time out was conducted at 10:43, prior to the start of the procedure. There was no bleeding. The procedure was tolerated well. Post Debridement Measurements: 12cm length x 17cm width x 0.5cm depth; 80.111cm^3 volume. Character of Wound/Ulcer Post Debridement is stable. Post procedure Diagnosis Wound #17: Same as Pre-Procedure Wound #18 Pre-procedure diagnosis of Wound #18 is a Sickle Cell Lesion located  on the Left,Posterior Ankle . There was a  Chemical/Enzymatic/Mechanical debridement performed by Ricard Dillon., MD. after achieving pain control using Lidocaine 5% topical ointment. Agent used was [employer]. A time out was conducted at 10:43, prior to the start of the procedure. There was no bleeding. The procedure was tolerated well. Post Debridement Measurements: 5.1cm length x 2.5cm width x 0.3cm depth; 3.004cm^3 volume. Character of Wound/Ulcer Post Debridement is stable. Post procedure Diagnosis Wound #18: Same as Pre-Procedure Wound #19 Pre-procedure diagnosis of Wound #19 is a Sickle Cell Lesion located on the Left,Medial Ankle . There was a Chemical/Enzymatic/Mechanical debridement performed by Ricard Dillon., MD. after achieving pain control using Lidocaine 5% topical ointment. Agent used was [employer]. A time out was conducted at 10:43, prior to the start of the procedure. There was no bleeding. The procedure was tolerated well. Post Debridement Measurements: 5.8cm length x 4cm width x 0.4cm depth; 7.288cm^3 volume. Character of Wound/Ulcer Post Debridement is stable. Post procedure Diagnosis Wound #19: Same as Pre-Procedure Wound #20 Pre-procedure diagnosis of Wound #20 is a Sickle Cell Lesion located on the Left,Lateral Ankle . There was a Chemical/Enzymatic/Mechanical debridement performed by Ricard Dillon., MD. after achieving pain control using Lidocaine 5% topical ointment. Agent used was [employer]. A time out was conducted at 10:43, prior to the start of the procedure. There was no bleeding. The procedure was tolerated well. Post Debridement Measurements: 2.2cm length x 1.3cm width x 0.1cm depth; 0.225cm^3 volume. Character of Wound/Ulcer Post Debridement is stable. Post procedure Diagnosis Wound #20: Same as Pre-Procedure Plan Follow-up Appointments: Return Appointment in 1 week. - with Dr. Dellia Nims Nurse Visit: - Friday 12/9 for dressing change Bathing/ Shower/ Hygiene: May shower with protection but do not get  wound dressing(s) wet. - Can get cast protector bags at [facility] or [facility] Additional Orders / Instructions: Follow Nutritious Diet WOUND #17: - Lower Leg Wound Laterality: Right, Circumferential Cleanser: Soap and Water 2 x Per Week/30 Days Discharge Instructions: May shower and wash wound with dial antibacterial soap and water prior to dressing change. Cleanser: Wound Cleanser 2 x Per Week/30 Days Discharge Instructions: Cleanse the wound with wound cleanser prior to applying a clean dressing using gauze sponges, not tissue or cotton balls. Prim Dressing: Hydrofera Blue Ready Foam, 2.5 x2.5 in 2 x Per Week/30 Days ary Discharge Instructions: Apply to wound bed as instructed Prim Dressing: Santyl Ointment 2 x Per Week/30 Days ary Discharge Instructions: Apply nickel thick amount to wound bed as instructed Secondary Dressing: ABD Pad, 5x9 2 x Per Week/30 Days Discharge Instructions: Apply over primary dressing as directed. Com pression Wrap: Kerlix Roll 4.5x3.1 (in/yd) 2 x Per Week/30 Days Discharge Instructions: Apply Kerlix and Coban compression as directed. Com pression Wrap: Coban Self-Adherent Wrap 4x5 (in/yd) 2 x Per Week/30 Days Discharge Instructions: Apply over Kerlix as directed. WOUND #18: - Ankle Wound Laterality: Left, Posterior Cleanser: Soap and Water 2 x Per Week/30 Days Discharge Instructions: May shower and wash wound with dial antibacterial soap and water prior to dressing change. Cleanser: Wound Cleanser 2 x Per Week/30 Days Discharge Instructions: Cleanse the wound with wound cleanser prior to applying a clean dressing using gauze sponges, not tissue or cotton balls. Prim Dressing: Hydrofera Blue Ready Foam, 2.5 x2.5 in 2 x Per Week/30 Days ary Discharge Instructions: Apply to wound bed as instructed Prim Dressing: Santyl Ointment 2 x Per Week/30 Days ary Discharge Instructions: Apply nickel thick amount to wound bed as instructed Secondary Dressing: ABD Pad, 5x9  2 x  Per Week/30 Days Discharge Instructions: Apply over primary dressing as directed. Com pression Wrap: Kerlix Roll 4.5x3.1 (in/yd) 2 x Per Week/30 Days Discharge Instructions: Apply Kerlix and Coban compression as directed. Com pression Wrap: Coban Self-Adherent Wrap 4x5 (in/yd) 2 x Per Week/30 Days Discharge Instructions: Apply over Kerlix as directed. WOUND #19: - Ankle Wound Laterality: Left, Medial Cleanser: Soap and Water 2 x Per Week/30 Days Discharge Instructions: May shower and wash wound with dial antibacterial soap and water prior to dressing change. Cleanser: Wound Cleanser 2 x Per Week/30 Days Discharge Instructions: Cleanse the wound with wound cleanser prior to applying a clean dressing using gauze sponges, not tissue or cotton balls. Prim Dressing: Hydrofera Blue Ready Foam, 2.5 x2.5 in 2 x Per Week/30 Days ary Discharge Instructions: Apply to wound bed as instructed Prim Dressing: Santyl Ointment 2 x Per Week/30 Days ary Discharge Instructions: Apply nickel thick amount to wound bed as instructed Secondary Dressing: ABD Pad, 5x9 2 x Per Week/30 Days Discharge Instructions: Apply over primary dressing as directed. Com pression Wrap: Kerlix Roll 4.5x3.1 (in/yd) 2 x Per Week/30 Days Discharge Instructions: Apply Kerlix and Coban compression as directed. Com pression Wrap: Coban Self-Adherent Wrap 4x5 (in/yd) 2 x Per Week/30 Days Discharge Instructions: Apply over Kerlix as directed. WOUND #20: - Ankle Wound Laterality: Left, Lateral Cleanser: Soap and Water 2 x Per Week/30 Days Discharge Instructions: May shower and wash wound with dial antibacterial soap and water prior to dressing change. Cleanser: Wound Cleanser 2 x Per Week/30 Days Discharge Instructions: Cleanse the wound with wound cleanser prior to applying a clean dressing using gauze sponges, not tissue or cotton balls. Prim Dressing: Hydrofera Blue Ready Foam, 2.5 x2.5 in 2 x Per Week/30 Days ary Discharge  Instructions: Apply to wound bed as instructed Prim Dressing: Santyl Ointment 2 x Per Week/30 Days ary Discharge Instructions: Apply nickel thick amount to wound bed as instructed Secondary Dressing: ABD Pad, 5x9 2 x Per Week/30 Days Discharge Instructions: Apply over primary dressing as directed. Com pression Wrap: Kerlix Roll 4.5x3.1 (in/yd) 2 x Per Week/30 Days Discharge Instructions: Apply Kerlix and Coban compression as directed. Com pression Wrap: Coban Self-Adherent Wrap 4x5 (in/yd) 2 x Per Week/30 Days Discharge Instructions: Apply over Kerlix as directed. #1 I am continuing with Santyl and Hydrofera Blue ABDs under kerlix Coban. I am bringing her in for a nurse change. This gets [place] away from the problems of Medicaid, patient cost etc. 2. Ultimately may need repeat venous reflux studies to compare with 1 she had done about 5 years ago. I really do not think that there is an arterial issue. 3. I am becoming a little more vigorous with the wound cleaning in the clinic she seems to tolerate this. I wonder if we will ultimately be able to work her up to some form of mechanical debridement but I am not pushing this. Sickle cell ulcers are notoriously painful 4. I see no evidence of infection Electronic Signature(s) Signed: 04/24/2021 4:21:48 PM By: Linton Ham MD Entered By: Linton Ham on 04/24/2021 11:51:14 -------------------------------------------------------------------------------- SuperBill Details Patient Name: Date of Service: [place], [name] 04/24/2021 Medical Record Number: [id number] Patient Account Number: [id number] Date of Birth/Sex: Treating RN: 1970-10-03 (50 y.o. Sue Lush Primary Care Provider: [name] Other Clinician: Referring Provider: Treating Provider/Extender: Farrel Demark Weeks in Treatment: 1 Diagnosis Coding ICD-10 Codes Code Description [phone] Non-pressure chronic ulcer of other part of right lower leg  with other  specified severity L97.828 Non-pressure chronic ulcer of other part of left lower leg with other specified severity 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 Facility Procedures CPT4 Code: [id number] Description: [id number] - DEBRIDE W/O ANES NON SELECT ICD-10 Diagnosis Description L97.818 Non-pressure chronic ulcer of other part of right lower leg with other specified L97.828 Non-pressure chronic ulcer of other part of left lower leg with other specified  s Modifier: severity everity Quantity: 1 Physician Procedures : CPT4 Code Description Modifier [id number] - WC PHYS LEVEL 4 - EST PT ICD-10 Diagnosis Description L97.818 Non-pressure chronic ulcer of other part of right lower leg with other specified severity L97.828 Non-pressure chronic ulcer of other part of  left lower leg with other specified severity 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 Quantity: 1 Electronic Signature(s) Signed: 04/24/2021 4:21:48 PM By: Linton Ham MD Entered By: Linton Ham on 04/24/2021 11:51:34

## 2021-04-25 ENCOUNTER — Telehealth: Payer: Self-pay

## 2021-04-25 NOTE — Telephone Encounter (Signed)
Oxycodone 10mg

## 2021-04-26 ENCOUNTER — Other Ambulatory Visit: Payer: Self-pay | Admitting: Family Medicine

## 2021-04-26 DIAGNOSIS — G894 Chronic pain syndrome: Secondary | ICD-10-CM

## 2021-04-26 MED ORDER — OXYCODONE HCL 10 MG PO TABS: 10.0000 mg | ORAL_TABLET | ORAL | 0 refills | Status: DC

## 2021-04-26 NOTE — Progress Notes (Signed)
Reviewed PDMP substance reporting system prior to prescribing opiate medications. No inconsistencies noted.  Meds ordered this encounter  Medications   Oxycodone HCl 10 MG TABS    Sig: Take 1 tablet (10 mg total) by mouth every 4 (four) hours while awake.    Dispense:  90 tablet    Refill:  0    Order Specific Question:   Supervising Provider    Answer:   JEGEDE, OLUGBEMIGA E [[id number]]   [name] Spaugh Moore Hai Grabe  APRN, MSN, FNP-C [facility] [address]  [place], [place] [address] [phone]  

## 2021-04-27 ENCOUNTER — Encounter (HOSPITAL_BASED_OUTPATIENT_CLINIC_OR_DEPARTMENT_OTHER): Payer: Medicaid Other | Admitting: Internal Medicine

## 2021-04-27 ENCOUNTER — Other Ambulatory Visit: Payer: Self-pay

## 2021-04-27 DIAGNOSIS — L97818 Non-pressure chronic ulcer of other part of right lower leg with other specified severity: Secondary | ICD-10-CM | POA: Diagnosis not present

## 2021-04-27 NOTE — Progress Notes (Signed)
[name], [name] ([id number]) Visit Report for 04/27/2021 SuperBill Details Patient Name: Date of Service: [name] [name] 04/27/2021 Medical Record Number: [id number] Patient Account Number: [id number] Date of Birth/Sex: Treating RN: 12/07/1970 (50 y.o. [name] Primary Care Provider: [name] Other Clinician: Referring Provider: Treating Provider/Extender: Herma Carson Weeks in Treatment: 1 Diagnosis Coding ICD-10 Codes Code Description [phone] Non-pressure chronic ulcer of other part of right lower leg with other specified severity L97.828 Non-pressure chronic ulcer of other part of left lower leg with other specified severity 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 Facility Procedures CPT4 Code Description Modifier Quantity [id number] - WOUND CARE VISIT-LEV 5 EST PT 1 Electronic Signature(s) Signed: 04/27/2021 10:31:08 AM By: Kalman Shan DO Signed: 04/27/2021 12:23:12 PM By: Deon Pilling RN, BSN Entered By: Deon Pilling on 04/27/2021 10:15:17

## 2021-04-27 NOTE — Progress Notes (Signed)
[name], [name] ([id number]) Visit Report for 04/27/2021 Arrival Information Details Patient Name: Date of Service: [name] [name] 04/27/2021 9:45 A M Medical Record Number: [id number] Patient Account Number: [id number] Date of Birth/Sex: Treating RN: 12/27/1970 (50 y.o. [name], [name] Primary Care [name]: [name] Other Clinician: Referring Rachael Zapanta: Treating Rheagan Nayak/Extender: Herma Carson Weeks in Treatment: 1 Visit Information History Since Last Visit Added or deleted any medications: No Patient Arrived: Wheel Chair Any new allergies or adverse reactions: No Arrival Time: 09:44 Had a fall or experienced change in No Accompanied By: friend activities of daily living that may affect Transfer Assistance: Manual risk of falls: Patient Identification Verified: Yes Signs or symptoms of abuse/neglect since last visito No Secondary Verification Process Completed: Yes Hospitalized since last visit: No Patient Requires Transmission-Based Precautions: No Implantable device outside of the clinic excluding No Patient Has Alerts: No cellular tissue based products placed in the center since last visit: Has Dressing in Place as Prescribed: No Has Compression in Place as Prescribed: No Pain Present Now: Yes Notes patient removed wraps washed legs and wounds and applied kerlix before coming in today. Electronic Signature(s) Signed: 04/27/2021 12:23:12 PM By: Deon Pilling RN, BSN Entered By: Deon Pilling on 04/27/2021 09:51:50 -------------------------------------------------------------------------------- Clinic Level of Care Assessment Details Patient Name: Date of Service: [name], [name] [name] 04/27/2021 9:45 A M Medical Record Number: [id number] Patient Account Number: [id number] Date of Birth/Sex: Treating RN: 12-22-1970 (50 y.o. [name], [name] Primary Care [name]: [name] Other Clinician: Referring [name]: Treating [name]/Extender:  Herma Carson Weeks in Treatment: 1 Clinic Level of Care Assessment Items TOOL 4 Quantity Score X- 1 0 Use when only an EandM is performed on FOLLOW-UP visit ASSESSMENTS - Nursing Assessment / Reassessment X- 1 10 Reassessment of Co-morbidities (includes updates in patient status) X- 1 5 Reassessment of Adherence to Treatment Plan ASSESSMENTS - Wound and Skin A ssessment / Reassessment []  - 0 Simple Wound Assessment / Reassessment - one wound X- 5 5 Complex Wound Assessment / Reassessment - multiple wounds X- 1 10 Dermatologic / Skin Assessment (not related to wound area) ASSESSMENTS - Focused Assessment X- 2 5 Circumferential Edema Measurements - multi extremities X- 1 10 Nutritional Assessment / Counseling / Intervention []  - 0 Lower Extremity Assessment (monofilament, tuning fork, pulses) []  - 0 Peripheral Arterial Disease Assessment (using hand held doppler) ASSESSMENTS - Ostomy and/or Continence Assessment and Care []  - 0 Incontinence Assessment and Management []  - 0 Ostomy Care Assessment and Management (repouching, etc.) PROCESS - Coordination of Care []  - 0 Simple Patient / Family Education for ongoing care X- 1 20 Complex (extensive) Patient / Family Education for ongoing care X- 1 10 Staff obtains Programmer, systems, Records, T Results / Process Orders est []  - 0 Staff telephones HHA, Nursing Homes / Clarify orders / etc []  - 0 Routine Transfer to another Facility (non-emergent condition) []  - 0 Routine Hospital Admission (non-emergent condition) []  - 0 New Admissions / Biomedical engineer / Ordering NPWT Apligraf, etc. , []  - 0 Emergency Hospital Admission (emergent condition) []  - 0 Simple Discharge Coordination X- 1 15 Complex (extensive) Discharge Coordination PROCESS - Special Needs []  - 0 Pediatric / Minor Patient Management []  - 0 Isolation Patient Management []  - 0 Hearing / Language / Visual special needs []  -  0 Assessment of Community assistance (transportation, D/C planning, etc.) []  - 0 Additional assistance / Altered mentation []  - 0 Support Surface(s) Assessment (bed, cushion, seat, etc.) INTERVENTIONS -  Wound Cleansing / Measurement []  - 0 Simple Wound Cleansing - one wound X- 5 5 Complex Wound Cleansing - multiple wounds X- 1 5 Wound Imaging (photographs - any number of wounds) []  - 0 Wound Tracing (instead of photographs) []  - 0 Simple Wound Measurement - one wound X- 5 Complex Wound Measurement - multiple wounds INTERVENTIONS - Wound Dressings []  - 0 Small Wound Dressing one or multiple wounds []  - 0 Medium Wound Dressing one or multiple wounds X- 2 20 Large Wound Dressing one or multiple wounds []  - 0 Application of Medications - topical []  - 0 Application of Medications - injection INTERVENTIONS - Miscellaneous []  - 0 External ear exam []  - 0 Specimen Collection (cultures, biopsies, blood, body fluids, etc.) []  - 0 Specimen(s) / Culture(s) sent or taken to Lab for analysis []  - 0 Patient Transfer (multiple staff / Civil Service fast streamer / Similar devices) []  - 0 Simple Staple / Suture removal (25 or less) []  - 0 Complex Staple / Suture removal (26 or more) []  - 0 Hypo / Hyperglycemic Management (close monitor of Blood Glucose) []  - 0 Ankle / Brachial Index ([name]) - do not check if billed separately X- 1 5 Vital Signs Has the patient been seen at the hospital within the last three years: Yes Total Score: 195 Level Of Care: New/Established - Level 5 Electronic Signature(s) Signed: 04/27/2021 12:23:12 PM By: Deon Pilling RN, BSN Entered By: Deon Pilling on 04/27/2021 10:15:08 -------------------------------------------------------------------------------- Encounter Discharge Information Details Patient Name: Date of Service: [name], FA [name] 04/27/2021 9:45 A M Medical Record Number: [id number] Patient Account Number: [id number] Date of Birth/Sex: Treating  RN: 1970-10-11 (50 y.o. [name] Primary Care [name]: [name] Other Clinician: Referring [name]: Treating [name]/Extender: Herma Carson Weeks in Treatment: 1 Encounter Discharge Information Items Discharge Condition: Stable Ambulatory Status: Wheelchair Discharge Destination: Home Transportation: Private Auto Accompanied By: friend Schedule Follow-up Appointment: Yes Clinical Summary of Care: Electronic Signature(s) Signed: 04/27/2021 12:23:12 PM By: Deon Pilling RN, BSN Entered By: Deon Pilling on 04/27/2021 10:13:53 -------------------------------------------------------------------------------- Pain Assessment Details Patient Name: Date of Service: [name] [name], FA [name] 04/27/2021 9:45 A M Medical Record Number: [id number] Patient Account Number: [id number] Date of Birth/Sex: Treating RN: June 18, 1970 (50 y.o. [name] Primary Care Everleigh Colclasure: [name] Other Clinician: Referring Meeghan Skipper: Treating Adalene Gulotta/Extender: Herma Carson Weeks in Treatment: 1 Active Problems Location of Pain Severity and Description of Pain Patient Has Paino Yes Site Locations Pain Location: Pain Location: Pain in Ulcers Rate the pain. Current Pain Level: 6 Worst Pain Level: 10 Least Pain Level: 0 Tolerable Pain Level: 8 Character of Pain Describe the Pain: Sharp Pain Management and Medication Current Pain Management: Medication: No Cold Application: No Rest: No Massage: No Activity: No T.E.N.S.: No Heat Application: No Leg drop or elevation: No Is the Current Pain Management Adequate: Adequate How does your wound impact your activities of daily livingo Sleep: No Bathing: No Appetite: No Relationship With Others: No Bladder Continence: No Emotions: No Bowel Continence: No Work: No Toileting: No Drive: No Dressing: No Hobbies: No Engineer, maintenance) Signed: 04/27/2021 12:23:12 PM By: Deon Pilling RN,  BSN Entered By: Deon Pilling on 04/27/2021 09:52:28 -------------------------------------------------------------------------------- Patient/Caregiver Education Details Patient Name: Date of Service: [name], FA [name] 12/9/2022andnbsp9:45 A M Medical Record Number: [id number] Patient Account Number: [id number] Date of Birth/Gender: Treating RN: 1971/03/23 (50 y.o. [name] Primary Care Physician: [name] Other Clinician: Referring Physician: Treating Physician/Extender: Rodman Pickle,  Tonita Phoenix in Treatment: 1 Education Assessment Education Provided To: Patient Education Topics Provided Wound/Skin Impairment: Handouts: Skin Care Do's and Dont's Methods: Explain/Verbal Responses: Reinforcements needed Electronic Signature(s) Signed: 04/27/2021 12:23:12 PM By: Deon Pilling RN, BSN Signed: 04/27/2021 12:23:12 PM By: Deon Pilling RN, BSN Entered By: Deon Pilling on 04/27/2021 10:12:16 -------------------------------------------------------------------------------- Wound Assessment Details Patient Name: Date of Service: [name], FA [name] 04/27/2021 9:45 A M Medical Record Number: [id number] Patient Account Number: [id number] Date of Birth/Sex: Treating RN: 02-12-1971 (50 y.o. [name], [name] Primary Care Jamarie Mussa: [name] Other Clinician: Referring Lura Falor: Treating Javonta Gronau/Extender: Herma Carson Weeks in Treatment: 1 Wound Status Wound Number: 17 Primary Etiology: [name] Cell Lesion Wound Location: Right, Circumferential Lower Leg Wound Status: Open Wounding Event: Gradually Appeared Date Acquired: 10/05/2012 Weeks Of Treatment: 1 Clustered Wound: Yes Wound Measurements Length: (cm) 12 Width: (cm) 17 Depth: (cm) 0.5 Area: (cm) 160.221 Volume: (cm) 80.111 % Reduction in Area: 14.7% % Reduction in Volume: -326.3% Wound Description Classification: Full Thickness Without Exposed Support Structu Exudate  Amount: Medium Exudate Type: Serosanguineous Exudate Color: red, brown res Treatment Notes Wound #17 (Lower Leg) Wound Laterality: Right, Circumferential Cleanser Soap and Water Discharge Instruction: May shower and wash wound with dial antibacterial soap and water prior to dressing change. Wound Cleanser Discharge Instruction: Cleanse the wound with wound cleanser prior to applying a clean dressing using gauze sponges, not tissue or cotton balls. Peri-Wound Care Topical Primary Dressing Hydrofera Blue Ready Foam, 2.5 x2.5 in Discharge Instruction: Apply to wound bed as instructed Santyl Ointment Discharge Instruction: Apply nickel thick amount to wound bed as instructed Secondary Dressing ABD Pad, 5x9 Discharge Instruction: Apply over primary dressing as directed. Secured With Compression Wrap Kerlix Roll 4.5x3.1 (in/yd) Discharge Instruction: Apply Kerlix and Coban compression as directed. Coban Self-Adherent Wrap 4x5 (in/yd) Discharge Instruction: Apply over Kerlix as directed. Compression Stockings Add-Ons Electronic Signature(s) Signed: 04/27/2021 12:23:12 PM By: Deon Pilling RN, BSN Entered By: Deon Pilling on 04/27/2021 10:07:41 -------------------------------------------------------------------------------- Wound Assessment Details Patient Name: Date of Service: [name] [name], FA [name] 04/27/2021 9:45 A M Medical Record Number: [id number] Patient Account Number: [id number] Date of Birth/Sex: Treating RN: 1971-04-04 (50 y.o. [name], [name] Primary Care Apphia Cropley: [name] Other Clinician: Referring Owen Pratte: Treating Jahari Billy/Extender: Herma Carson Weeks in Treatment: 1 Wound Status Wound Number: 18 Primary Etiology: [name] Cell Lesion Wound Location: Left, Posterior Ankle Wound Status: Open Wounding Event: Gradually Appeared Date Acquired: 12/25/2020 Weeks Of Treatment: 1 Clustered Wound: No Wound Measurements Length: (cm)  5.1 Width: (cm) 2.5 Depth: (cm) 0.3 Area: (cm) 10.014 Volume: (cm) 3.004 % Reduction in Area: 29.2% % Reduction in Volume: 29.2% Wound Description Classification: Full Thickness Without Exposed Support Structu Exudate Amount: Medium Exudate Type: Serosanguineous Exudate Color: red, brown res Treatment Notes Wound #18 (Ankle) Wound Laterality: Left, Posterior Cleanser Soap and Water Discharge Instruction: May shower and wash wound with dial antibacterial soap and water prior to dressing change. Wound Cleanser Discharge Instruction: Cleanse the wound with wound cleanser prior to applying a clean dressing using gauze sponges, not tissue or cotton balls. Peri-Wound Care Topical Primary Dressing Hydrofera Blue Ready Foam, 2.5 x2.5 in Discharge Instruction: Apply to wound bed as instructed Santyl Ointment Discharge Instruction: Apply nickel thick amount to wound bed as instructed Secondary Dressing ABD Pad, 5x9 Discharge Instruction: Apply over primary dressing as directed. Secured With Compression Wrap Kerlix Roll 4.5x3.1 (in/yd) Discharge Instruction: Apply Kerlix and Coban compression as directed. Coban Self-Adherent Wrap 4x5 (in/yd)  Discharge Instruction: Apply over Kerlix as directed. Compression Stockings Add-Ons Electronic Signature(s) Signed: 04/27/2021 12:23:12 PM By: Deon Pilling RN, BSN Entered By: Deon Pilling on 04/27/2021 10:07:41 -------------------------------------------------------------------------------- Wound Assessment Details Patient Name: Date of Service: [name] [name], FA [name] 04/27/2021 9:45 A M Medical Record Number: [id number] Patient Account Number: [id number] Date of Birth/Sex: Treating RN: Jul 13, 1970 (50 y.o. [name], [name] Primary Care Keimari [place]: [name] Other Clinician: Referring Amadea Keagy: Treating Charlton Boule/Extender: Herma Carson Weeks in Treatment: 1 Wound Status Wound Number: 19 Primary Etiology: [name]  Cell Lesion Wound Location: Left, Medial Ankle Wound Status: Open Wounding Event: Gradually Appeared Date Acquired: 12/25/2020 Weeks Of Treatment: 1 Clustered Wound: No Wound Measurements Length: (cm) 5.8 Width: (cm) 4 Depth: (cm) 0.4 Area: (cm) 18.221 Volume: (cm) 7.288 % Reduction in Area: 14.1% % Reduction in Volume: 50.9% Wound Description Classification: Full Thickness Without Exposed Support Structu Exudate Amount: Medium Exudate Type: Serosanguineous Exudate Color: red, brown res Treatment Notes Wound #19 (Ankle) Wound Laterality: Left, Medial Cleanser Soap and Water Discharge Instruction: May shower and wash wound with dial antibacterial soap and water prior to dressing change. Wound Cleanser Discharge Instruction: Cleanse the wound with wound cleanser prior to applying a clean dressing using gauze sponges, not tissue or cotton balls. Peri-Wound Care Topical Primary Dressing Hydrofera Blue Ready Foam, 2.5 x2.5 in Discharge Instruction: Apply to wound bed as instructed Santyl Ointment Discharge Instruction: Apply nickel thick amount to wound bed as instructed Secondary Dressing ABD Pad, 5x9 Discharge Instruction: Apply over primary dressing as directed. Secured With Compression Wrap Kerlix Roll 4.5x3.1 (in/yd) Discharge Instruction: Apply Kerlix and Coban compression as directed. Coban Self-Adherent Wrap 4x5 (in/yd) Discharge Instruction: Apply over Kerlix as directed. Compression Stockings Add-Ons Electronic Signature(s) Signed: 04/27/2021 12:23:12 PM By: Deon Pilling RN, BSN Entered By: Deon Pilling on 04/27/2021 10:07:41 -------------------------------------------------------------------------------- Wound Assessment Details Patient Name: Date of Service: [name] [name], FA [name] 04/27/2021 9:45 A M Medical Record Number: [id number] Patient Account Number: [id number] Date of Birth/Sex: Treating RN: 1970-11-07 (50 y.o. [name], [name] Primary Care  Octa Uplinger: [name] Other Clinician: Referring Kayin Osment: Treating Chenita Ruda/Extender: Herma Carson Weeks in Treatment: 1 Wound Status Wound Number: 20 Primary Etiology: [name] Cell Lesion Wound Location: Left, Lateral Ankle Wound Status: Open Wounding Event: Gradually Appeared Date Acquired: 12/25/2020 Weeks Of Treatment: 1 Clustered Wound: No Wound Measurements Length: (cm) 2.2 Width: (cm) 1.3 Depth: (cm) 0.1 Area: (cm) 2.246 Volume: (cm) 0.225 % Reduction in Area: 12% % Reduction in Volume: 11.8% Wound Description Classification: Full Thickness Without Exposed Support Structu Exudate Amount: Medium Exudate Type: Serosanguineous Exudate Color: red, brown res Treatment Notes Wound #20 (Ankle) Wound Laterality: Left, Lateral Cleanser Soap and Water Discharge Instruction: May shower and wash wound with dial antibacterial soap and water prior to dressing change. Wound Cleanser Discharge Instruction: Cleanse the wound with wound cleanser prior to applying a clean dressing using gauze sponges, not tissue or cotton balls. Peri-Wound Care Topical Primary Dressing Hydrofera Blue Ready Foam, 2.5 x2.5 in Discharge Instruction: Apply to wound bed as instructed Santyl Ointment Discharge Instruction: Apply nickel thick amount to wound bed as instructed Secondary Dressing ABD Pad, 5x9 Discharge Instruction: Apply over primary dressing as directed. Secured With Compression Wrap Kerlix Roll 4.5x3.1 (in/yd) Discharge Instruction: Apply Kerlix and Coban compression as directed. Coban Self-Adherent Wrap 4x5 (in/yd) Discharge Instruction: Apply over Kerlix as directed. Compression Stockings Add-Ons Electronic Signature(s) Signed: 04/27/2021 12:23:12 PM By: Deon Pilling RN, BSN Entered By: Deon Pilling on 04/27/2021 10:07:41 --------------------------------------------------------------------------------  Vitals Details Patient Name: Date of  Service: [name] [name] 04/27/2021 9:45 A M Medical Record Number: [id number] Patient Account Number: [id number] Date of Birth/Sex: Treating RN: 1971/02/05 (50 y.o. [name], Meta.Reding Primary Care Tauri Ethington: [name] Other Clinician: Referring Raeghan Demeter: Treating Carolan Avedisian/Extender: Herma Carson Weeks in Treatment: 1 Vital Signs Time Taken: 09:50 Temperature (F): 98.1 Height (in): 67 Pulse (bpm): 69 Respiratory Rate (breaths/min): 20 Blood Pressure (mmHg): 122/74 Reference Range: 80 - 120 mg / dl Electronic Signature(s) Signed: 04/27/2021 12:23:12 PM By: Deon Pilling RN, BSN Entered By: Deon Pilling on 04/27/2021 09:52:04

## 2021-05-01 ENCOUNTER — Encounter (HOSPITAL_BASED_OUTPATIENT_CLINIC_OR_DEPARTMENT_OTHER): Payer: Medicaid Other | Admitting: Internal Medicine

## 2021-05-01 ENCOUNTER — Other Ambulatory Visit: Payer: Medicaid Other

## 2021-05-01 ENCOUNTER — Non-Acute Institutional Stay (HOSPITAL_COMMUNITY)
Admission: RE | Admit: 2021-05-01 | Discharge: 2021-05-01 | Disposition: A | Discharge status: Home / Self Care | Payer: Medicaid Other | Source: Ambulatory Visit | Attending: Internal Medicine | Admitting: Internal Medicine

## 2021-05-01 ENCOUNTER — Other Ambulatory Visit: Payer: Self-pay

## 2021-05-01 ENCOUNTER — Other Ambulatory Visit: Payer: Self-pay | Admitting: Family Medicine

## 2021-05-01 DIAGNOSIS — D571 Sickle-cell disease without crisis: Secondary | ICD-10-CM | POA: Diagnosis not present

## 2021-05-01 LAB — RETICULOCYTES
Immature Retic Fract: 26.7 % — ABNORMAL HIGH (ref 2.3–15.9)
RBC.: 2.84 MIL/uL — ABNORMAL LOW (ref 3.87–5.11)
Retic Count, Absolute: 199.9 10*3/uL — ABNORMAL HIGH (ref 19.0–186.0)
Retic Ct Pct: 7 % — ABNORMAL HIGH (ref 0.4–3.1)

## 2021-05-01 LAB — CBC WITH DIFFERENTIAL/PLATELET
Abs Immature Granulocytes: 0.06 10*3/uL (ref 0.00–0.07)
Basophils Absolute: 0.2 10*3/uL — ABNORMAL HIGH (ref 0.0–0.1)
Basophils Relative: 2 %
Eosinophils Absolute: 1 10*3/uL — ABNORMAL HIGH (ref 0.0–0.5)
Eosinophils Relative: 7 %
HCT: 25.6 % — ABNORMAL LOW (ref 36.0–46.0)
Hemoglobin: 8.7 g/dL — ABNORMAL LOW (ref 12.0–15.0)
Immature Granulocytes: 0 %
Lymphocytes Relative: 19 %
Lymphs Abs: 2.5 10*3/uL (ref 0.7–4.0)
MCH: 31.2 pg (ref 26.0–34.0)
MCHC: 34 g/dL (ref 30.0–36.0)
MCV: 91.8 fL (ref 80.0–100.0)
Monocytes Absolute: 1 10*3/uL (ref 0.1–1.0)
Monocytes Relative: 8 %
Neutro Abs: 8.6 10*3/uL — ABNORMAL HIGH (ref 1.7–7.7)
Neutrophils Relative %: 64 %
Platelets: 442 10*3/uL — ABNORMAL HIGH (ref 150–400)
RBC: 2.79 MIL/uL — ABNORMAL LOW (ref 3.87–5.11)
RDW: 18.1 % — ABNORMAL HIGH (ref 11.5–15.5)
WBC: 13.4 10*3/uL — ABNORMAL HIGH (ref 4.0–10.5)
nRBC: 0.7 % — ABNORMAL HIGH (ref 0.0–0.2)

## 2021-05-01 LAB — TYPE AND SCREEN
ABO/RH(D): B POS
Antibody Screen: NEGATIVE
Unit division: 0
Unit division: 0

## 2021-05-01 LAB — BPAM RBC
Blood Product Expiration Date: 202301032359
Blood Product Expiration Date: 202301072359
Unit Type and Rh: 5100
Unit Type and Rh: 5100

## 2021-05-01 NOTE — Progress Notes (Signed)
Patient arrived for lab draw. Phlebotomist drew labs (CBC w/diff, Retic and Type & Screen) via peripheral stick.  Patient tolerated well. Blue blood bank bracelet placed on patient's wrist. Provider will notify patient of lab results and patient will receive blood transfusion if hemoglobin below 8.0. Patient alert, oriented and transferred in wheelchair at discharge.

## 2021-05-03 ENCOUNTER — Other Ambulatory Visit: Payer: Self-pay

## 2021-05-03 ENCOUNTER — Encounter (HOSPITAL_BASED_OUTPATIENT_CLINIC_OR_DEPARTMENT_OTHER): Payer: Medicaid Other | Admitting: Internal Medicine

## 2021-05-03 DIAGNOSIS — L97818 Non-pressure chronic ulcer of other part of right lower leg with other specified severity: Secondary | ICD-10-CM | POA: Diagnosis not present

## 2021-05-03 NOTE — Progress Notes (Signed)
JANELIE, GOLTZ ([id number]) Visit Report for 05/03/2021 Arrival Information Details Patient Name: Date of Service: [name] 05/03/2021 10:30 A M Medical Record Number: [id number] Patient Account Number: [id number] Date of Birth/Sex: Treating RN: [birth year] (50 y.o. [name] Primary Care Galilea Quito: [name] Other Clinician: Referring Demetruis Depaul: Treating Kylie Simmonds/Extender: Bernette Redbird in Treatment: 2 Visit Information History Since Last Visit Added or deleted any medications: No Patient Arrived: Wheel Chair Any new allergies or adverse reactions: No Arrival Time: 10:08 Had a fall or experienced change in No Accompanied By: friend activities of daily living that may affect Transfer Assistance: Manual risk of falls: Patient Identification Verified: Yes Signs or symptoms of abuse/neglect since last visito No Secondary Verification Process Completed: Yes Hospitalized since last visit: No Patient Requires Transmission-Based Precautions: No Implantable device outside of the clinic excluding No Patient Has Alerts: No cellular tissue based products placed in the center since last visit: Has Dressing in Place as Prescribed: Yes Pain Present Now: Yes Electronic Signature(s) Signed: 05/03/2021 10:28:40 AM By: Sandre Kitty Entered By: Sandre Kitty on 05/03/2021 10:09:26 -------------------------------------------------------------------------------- Encounter Discharge Information Details Patient Name: Date of Service: [name], [name] 05/03/2021 10:30 A M Medical Record Number: [id number] Patient Account Number: [id number] Date of Birth/Sex: Treating RN: May 27, 1970 (50 y.o. [name] Primary Care Jessejames Steelman: [name] Other Clinician: Referring Terek Bee: Treating Brogan England/Extender: Bernette Redbird in Treatment: 2 Encounter Discharge Information Items Post Procedure Vitals Discharge  Condition: Stable Temperature (F): 98.7 Ambulatory Status: Wheelchair Pulse (bpm): 80 Discharge Destination: Home Respiratory Rate (breaths/min): 18 Transportation: Private Auto Blood Pressure (mmHg): 152/75 Accompanied By: friend Schedule Follow-up Appointment: Yes Clinical Summary of Care: Patient Declined Electronic Signature(s) Signed: 05/03/2021 5:32:07 PM By: Baruch Gouty RN, BSN Entered By: Baruch Gouty on 05/03/2021 12:55:27 -------------------------------------------------------------------------------- Pain Assessment Details Patient Name: Date of Service: [name], [name] 05/03/2021 10:30 A M Medical Record Number: [id number] Patient Account Number: [id number] Date of Birth/Sex: Treating RN: [birth year] (50 y.o. [name] Primary Care Latoi Giraldo: [name] Other Clinician: Referring Laketia Vicknair: Treating Eryc Bodey/Extender: Farrel Demark Weeks in Treatment: 2 Active Problems Location of Pain Severity and Description of Pain Patient Has Paino Yes Site Locations Rate the pain. Current Pain Level: 5 Pain Management and Medication Current Pain Management: Electronic Signature(s) Signed: 05/03/2021 10:28:40 AM By: Sandre Kitty Signed: 05/03/2021 5:30:53 PM By: Deon Pilling RN, BSN Entered By: Sandre Kitty on 05/03/2021 10:09:56 -------------------------------------------------------------------------------- Patient/Caregiver Education Details Patient Name: Date of Service: [name], [name] 12/15/2022andnbsp10:30 A M Medical Record Number: [id number] Patient Account Number: [id number] Date of Birth/Gender: Treating RN: [birth year] (50 y.o. [name] Primary Care Physician: [name] Other Clinician: Referring Physician: Treating Physician/Extender: Bernette Redbird in Treatment: 2 Education Assessment Education Provided To: Patient Education Topics Provided Wound/Skin  Impairment: Methods: Explain/Verbal Responses: Reinforcements needed, State content correctly Electronic Signature(s) Signed: 05/03/2021 5:32:07 PM By: Baruch Gouty RN, BSN Entered By: Baruch Gouty on 05/03/2021 12:54:45 -------------------------------------------------------------------------------- Wound Assessment Details Patient Name: Date of Service: [name], [name] 05/03/2021 10:30 A M Medical Record Number: [id number] Patient Account Number: [id number] Date of Birth/Sex: Treating RN: Apr 11, 1971 (50 y.o. [name] Primary Care Jennet Scroggin: [name] Other Clinician: Referring Shayli Altemose: Treating Tersea Aulds/Extender: Farrel Demark Weeks in Treatment: 2 Wound Status Wound Number: 17 Primary Etiology: Sickle Cell Lesion Wound Location: Right, Circumferential Lower Leg Wound Status: Open Wounding Event: Gradually Appeared Date Acquired: 10/05/2012 Weeks Of  Treatment: 2 Clustered Wound: Yes Wound Measurements Length: (cm) 12 Width: (cm) 17 Depth: (cm) 0.5 Area: (cm) 160.221 Volume: (cm) 80.111 % Reduction in Area: 14.7% % Reduction in Volume: -326.3% Wound Description Classification: Full Thickness Without Exposed Support Structu Exudate Amount: Medium Exudate Type: Serosanguineous Exudate Color: red, brown res Treatment Notes Wound #17 (Lower Leg) Wound Laterality: Right, Circumferential Cleanser Soap and Water Discharge Instruction: May shower and wash wound with dial antibacterial soap and water prior to dressing change. Wound Cleanser Discharge Instruction: Cleanse the wound with wound cleanser prior to applying a clean dressing using gauze sponges, not tissue or cotton balls. Peri-Wound Care Topical Primary Dressing Hydrofera Blue Ready Foam, 2.5 x2.5 in Discharge Instruction: Apply to wound bed as instructed Santyl Ointment Discharge Instruction: Apply nickel thick amount to wound bed as instructed Secondary  Dressing ABD Pad, 5x9 Discharge Instruction: Apply over primary dressing as directed. Secured With Compression Wrap Kerlix Roll 4.5x3.1 (in/yd) Discharge Instruction: Apply Kerlix and Coban compression as directed. Coban Self-Adherent Wrap 4x5 (in/yd) Discharge Instruction: Apply over Kerlix as directed. Compression Stockings Add-Ons Electronic Signature(s) Signed: 05/03/2021 10:28:40 AM By: Sandre Kitty Signed: 05/03/2021 5:30:53 PM By: Deon Pilling RN, BSN Entered By: Sandre Kitty on 05/03/2021 10:10:31 -------------------------------------------------------------------------------- Wound Assessment Details Patient Name: Date of Service: [name], [name] 05/03/2021 10:30 A M Medical Record Number: [id number] Patient Account Number: [id number] Date of Birth/Sex: Treating RN: [birth year] (50 y.o. Helene Shoe, Tammi Klippel Primary Care Alesandra Smart: [name] Other Clinician: Referring Adeana Grilliot: Treating Bari Leib/Extender: Farrel Demark Weeks in Treatment: 2 Wound Status Wound Number: 18 Primary Etiology: Sickle Cell Lesion Wound Location: Left, Posterior Ankle Wound Status: Open Wounding Event: Gradually Appeared Date Acquired: 12/25/2020 Weeks Of Treatment: 2 Clustered Wound: No Wound Measurements Length: (cm) 5.1 Width: (cm) 2.5 Depth: (cm) 0.3 Area: (cm) 10.014 Volume: (cm) 3.004 % Reduction in Area: 29.2% % Reduction in Volume: 29.2% Wound Description Classification: Full Thickness Without Exposed Support Structu Exudate Amount: Medium Exudate Type: Serosanguineous Exudate Color: red, brown res Treatment Notes Wound #18 (Ankle) Wound Laterality: Left, Posterior Cleanser Soap and Water Discharge Instruction: May shower and wash wound with dial antibacterial soap and water prior to dressing change. Wound Cleanser Discharge Instruction: Cleanse the wound with wound cleanser prior to applying a clean dressing using gauze sponges, not  tissue or cotton balls. Peri-Wound Care Topical Primary Dressing Hydrofera Blue Ready Foam, 2.5 x2.5 in Discharge Instruction: Apply to wound bed as instructed Santyl Ointment Discharge Instruction: Apply nickel thick amount to wound bed as instructed Secondary Dressing ABD Pad, 5x9 Discharge Instruction: Apply over primary dressing as directed. Secured With Compression Wrap Kerlix Roll 4.5x3.1 (in/yd) Discharge Instruction: Apply Kerlix and Coban compression as directed. Coban Self-Adherent Wrap 4x5 (in/yd) Discharge Instruction: Apply over Kerlix as directed. Compression Stockings Add-Ons Electronic Signature(s) Signed: 05/03/2021 10:28:40 AM By: Sandre Kitty Signed: 05/03/2021 5:30:53 PM By: Deon Pilling RN, BSN Entered By: Sandre Kitty on 05/03/2021 10:10:31 -------------------------------------------------------------------------------- Wound Assessment Details Patient Name: Date of Service: [name], [name] 05/03/2021 10:30 A M Medical Record Number: [id number] Patient Account Number: [id number] Date of Birth/Sex: Treating RN: May 09, 1971 (50 y.o. [name] Primary Care Elleni Mozingo: [name] Other Clinician: Referring Sahory Nordling: Treating Ixchel Duck/Extender: Farrel Demark Weeks in Treatment: 2 Wound Status Wound Number: 19 Primary Etiology: Sickle Cell Lesion Wound Location: Left, Medial Ankle Wound Status: Open Wounding Event: Gradually Appeared Date Acquired: 12/25/2020 Weeks Of Treatment: 2 Clustered Wound: No Wound Measurements Length: (cm) 5.8 Width: (cm) 4  Depth: (cm) 0.4 Area: (cm) 18.221 Volume: (cm) 7.288 % Reduction in Area: 14.1% % Reduction in Volume: 50.9% Wound Description Classification: Full Thickness Without Exposed Support Structu Exudate Amount: Medium Exudate Type: Serosanguineous Exudate Color: red, brown res Treatment Notes Wound #19 (Ankle) Wound Laterality: Left, Medial Cleanser Soap and  Water Discharge Instruction: May shower and wash wound with dial antibacterial soap and water prior to dressing change. Wound Cleanser Discharge Instruction: Cleanse the wound with wound cleanser prior to applying a clean dressing using gauze sponges, not tissue or cotton balls. Peri-Wound Care Topical Primary Dressing Hydrofera Blue Ready Foam, 2.5 x2.5 in Discharge Instruction: Apply to wound bed as instructed Santyl Ointment Discharge Instruction: Apply nickel thick amount to wound bed as instructed Secondary Dressing ABD Pad, 5x9 Discharge Instruction: Apply over primary dressing as directed. Secured With Compression Wrap Kerlix Roll 4.5x3.1 (in/yd) Discharge Instruction: Apply Kerlix and Coban compression as directed. Coban Self-Adherent Wrap 4x5 (in/yd) Discharge Instruction: Apply over Kerlix as directed. Compression Stockings Add-Ons Electronic Signature(s) Signed: 05/03/2021 10:28:40 AM By: Sandre Kitty Signed: 05/03/2021 5:30:53 PM By: Deon Pilling RN, BSN Entered By: Sandre Kitty on 05/03/2021 10:10:31 -------------------------------------------------------------------------------- Wound Assessment Details Patient Name: Date of Service: [name], [name] 05/03/2021 10:30 A M Medical Record Number: [id number] Patient Account Number: [id number] Date of Birth/Sex: Treating RN: 04-01-1971 (50 y.o. Helene Shoe, Tammi Klippel Primary Care Kaetlin Bullen: [name] Other Clinician: Referring Ceri Mayer: Treating Amour Cutrone/Extender: Farrel Demark Weeks in Treatment: 2 Wound Status Wound Number: 20 Primary Etiology: Sickle Cell Lesion Wound Location: Left, Lateral Ankle Wound Status: Open Wounding Event: Gradually Appeared Date Acquired: 12/25/2020 Weeks Of Treatment: 2 Clustered Wound: No Wound Measurements Length: (cm) 2.2 Width: (cm) 1.3 Depth: (cm) 0.1 Area: (cm) 2.246 Volume: (cm) 0.225 % Reduction in Area: 12% % Reduction in Volume:  11.8% Wound Description Classification: Full Thickness Without Exposed Support Structu Exudate Amount: Medium Exudate Type: Serosanguineous Exudate Color: red, brown res Treatment Notes Wound #20 (Ankle) Wound Laterality: Left, Lateral Cleanser Soap and Water Discharge Instruction: May shower and wash wound with dial antibacterial soap and water prior to dressing change. Wound Cleanser Discharge Instruction: Cleanse the wound with wound cleanser prior to applying a clean dressing using gauze sponges, not tissue or cotton balls. Peri-Wound Care Topical Primary Dressing Hydrofera Blue Ready Foam, 2.5 x2.5 in Discharge Instruction: Apply to wound bed as instructed Santyl Ointment Discharge Instruction: Apply nickel thick amount to wound bed as instructed Secondary Dressing ABD Pad, 5x9 Discharge Instruction: Apply over primary dressing as directed. Secured With Compression Wrap Kerlix Roll 4.5x3.1 (in/yd) Discharge Instruction: Apply Kerlix and Coban compression as directed. Coban Self-Adherent Wrap 4x5 (in/yd) Discharge Instruction: Apply over Kerlix as directed. Compression Stockings Add-Ons Electronic Signature(s) Signed: 05/03/2021 10:28:40 AM By: Sandre Kitty Signed: 05/03/2021 5:30:53 PM By: Deon Pilling RN, BSN Entered By: Sandre Kitty on 05/03/2021 10:10:31 -------------------------------------------------------------------------------- Vitals Details Patient Name: Date of Service: [name], [name] 05/03/2021 10:30 A M Medical Record Number: [id number] Patient Account Number: [id number] Date of Birth/Sex: Treating RN: [birth year] (50 y.o. Helene Shoe, Tammi Klippel Primary Care Mase Dhondt: [name] Other Clinician: Referring Rayman Petrosian: Treating Chane Cowden/Extender: Farrel Demark Weeks in Treatment: 2 Vital Signs Time Taken: 10:09 Temperature (F): 98.7 Height (in): 67 Pulse (bpm): 80 Respiratory Rate (breaths/min): 20 Blood Pressure  (mmHg): 152/75 Reference Range: 80 - 120 mg / dl Electronic Signature(s) Signed: 05/03/2021 10:28:40 AM By: Sandre Kitty Entered By: Sandre Kitty on 05/03/2021 10:09:46

## 2021-05-03 NOTE — Progress Notes (Signed)
[name], DITULLIO ([id number]) Visit Report for 05/03/2021 Debridement Details Patient Name: Date of Service: [name] [name] 05/03/2021 10:30 A M Medical Record Number: [id number] Patient Account Number: [id number] Date of Birth/Sex: Treating RN: 1970/06/05 (50 y.o. [name] Primary Care Provider: [name] Other Clinician: Referring Provider: Treating Provider/Extender: Farrel Demark Weeks in Treatment: 2 Debridement Performed for Assessment: Wound #17 Right,Circumferential Lower Leg Performed By: Clinician Baruch Gouty, RN Debridement Type: Chemical/Enzymatic/Mechanical Agent Used: Santyl Level of Consciousness (Pre-procedure): Awake and Alert Pre-procedure Verification/Time Out No Taken: Bleeding: None Response to Treatment: Procedure was tolerated well Level of Consciousness (Post- Awake and Alert procedure): Post Debridement Measurements of Total Wound Length: (cm) 12 Width: (cm) 17 Depth: (cm) 0.1 Volume: (cm) 16.022 Character of Wound/Ulcer Post Debridement: Requires Further Debridement Electronic Signature(s) Signed: 05/03/2021 4:39:12 PM By: Linton Ham MD Signed: 05/03/2021 5:32:07 PM By: Baruch Gouty RN, BSN Entered By: Baruch Gouty on 05/03/2021 12:52:41 -------------------------------------------------------------------------------- Debridement Details Patient Name: Date of Service: [name], [name] 05/03/2021 10:30 A M Medical Record Number: [id number] Patient Account Number: [id number] Date of Birth/Sex: Treating RN: 10/19/1970 (50 y.o. [name] Primary Care Provider: [name] Other Clinician: Referring Provider: Treating Provider/Extender: Farrel Demark Weeks in Treatment: 2 Debridement Performed for Assessment: Wound #18 Left,Posterior Ankle Performed By: Clinician Baruch Gouty, RN Debridement Type: Chemical/Enzymatic/Mechanical Agent Used: Santyl Level of  Consciousness (Pre-procedure): Awake and Alert Pre-procedure Verification/Time Out No Taken: Bleeding: None Response to Treatment: Procedure was tolerated well Level of Consciousness (Post- Awake and Alert procedure): Post Debridement Measurements of Total Wound Length: (cm) 5.1 Width: (cm) 2.5 Depth: (cm) 0.1 Volume: (cm) 1.001 Character of Wound/Ulcer Post Debridement: Requires Further Debridement Electronic Signature(s) Signed: 05/03/2021 4:39:12 PM By: Linton Ham MD Signed: 05/03/2021 5:32:07 PM By: Baruch Gouty RN, BSN Entered By: Baruch Gouty on 05/03/2021 12:53:15 -------------------------------------------------------------------------------- Debridement Details Patient Name: Date of Service: [name], [name] 05/03/2021 10:30 A M Medical Record Number: [id number] Patient Account Number: [id number] Date of Birth/Sex: Treating RN: 12-Mar-1971 (50 y.o. [name] Primary Care Provider: [name] Other Clinician: Referring Provider: Treating Provider/Extender: Farrel Demark Weeks in Treatment: 2 Debridement Performed for Assessment: Wound #19 Left,Medial Ankle Performed By: Clinician Baruch Gouty, RN Debridement Type: Chemical/Enzymatic/Mechanical Agent Used: Santyl Level of Consciousness (Pre-procedure): Awake and Alert Pre-procedure Verification/Time Out No Taken: Bleeding: None Response to Treatment: Procedure was tolerated well Level of Consciousness (Post- Awake and Alert procedure): Post Debridement Measurements of Total Wound Length: (cm) 5.8 Width: (cm) 4 Depth: (cm) 0.1 Volume: (cm) 1.822 Character of Wound/Ulcer Post Debridement: Requires Further Debridement Electronic Signature(s) Signed: 05/03/2021 4:39:12 PM By: Linton Ham MD Signed: 05/03/2021 5:32:07 PM By: Baruch Gouty RN, BSN Entered By: Baruch Gouty on 05/03/2021  12:53:39 -------------------------------------------------------------------------------- Debridement Details Patient Name: Date of Service: [name], [name] 05/03/2021 10:30 A M Medical Record Number: [id number] Patient Account Number: [id number] Date of Birth/Sex: Treating RN: 12-20-1970 (50 y.o. [name] Primary Care Provider: [name] Other Clinician: Referring Provider: Treating Provider/Extender: Farrel Demark Weeks in Treatment: 2 Debridement Performed for Assessment: Wound #20 Left,Lateral Ankle Performed By: Clinician Baruch Gouty, RN Debridement Type: Chemical/Enzymatic/Mechanical Agent Used: Santyl Level of Consciousness (Pre-procedure): Awake and Alert Pre-procedure Verification/Time Out No Taken: Bleeding: None Response to Treatment: Procedure was tolerated well Level of Consciousness (Post- Awake and Alert procedure): Post Debridement Measurements of Total Wound Length: (cm) 2.2 Width: (cm) 1.3 Depth: (cm) 0.1 Volume: (cm) 0.225 Character  of Wound/Ulcer Post Debridement: Requires Further Debridement Electronic Signature(s) Signed: 05/03/2021 4:39:12 PM By: Linton Ham MD Signed: 05/03/2021 5:32:07 PM By: Baruch Gouty RN, BSN Entered By: Baruch Gouty on 05/03/2021 12:54:07 -------------------------------------------------------------------------------- SuperBill Details Patient Name: Date of Service: [name], [name] 05/03/2021 Medical Record Number: [id number] Patient Account Number: [id number] Date of Birth/Sex: Treating RN: [birth year] (50 y.o. 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

## 2021-05-08 ENCOUNTER — Encounter (HOSPITAL_BASED_OUTPATIENT_CLINIC_OR_DEPARTMENT_OTHER): Payer: Medicaid Other | Admitting: Internal Medicine

## 2021-05-08 ENCOUNTER — Other Ambulatory Visit: Payer: Self-pay

## 2021-05-08 DIAGNOSIS — L97818 Non-pressure chronic ulcer of other part of right lower leg with other specified severity: Secondary | ICD-10-CM | POA: Diagnosis not present

## 2021-05-15 ENCOUNTER — Encounter (HOSPITAL_BASED_OUTPATIENT_CLINIC_OR_DEPARTMENT_OTHER): Payer: Medicaid Other | Admitting: Internal Medicine

## 2021-05-15 ENCOUNTER — Other Ambulatory Visit: Payer: Self-pay | Admitting: Family Medicine

## 2021-05-15 ENCOUNTER — Other Ambulatory Visit: Payer: Medicaid Other

## 2021-05-15 ENCOUNTER — Other Ambulatory Visit: Payer: Self-pay

## 2021-05-15 DIAGNOSIS — L97818 Non-pressure chronic ulcer of other part of right lower leg with other specified severity: Secondary | ICD-10-CM | POA: Diagnosis not present

## 2021-05-15 DIAGNOSIS — D571 Sickle-cell disease without crisis: Secondary | ICD-10-CM

## 2021-05-15 NOTE — Progress Notes (Signed)
Orders Placed This Encounter  Procedures   CBC w/Diff/Platelet    Standing Status:   Future    Standing Expiration Date:   05/15/2022   CMP and Liver    Standing Status:   Future    Standing Expiration Date:   05/15/2022     [name] Pounds  APRN, MSN, FNP-C Patient [place] [address] [place], [place] [address] [phone]

## 2021-05-15 NOTE — Progress Notes (Signed)
MEHGAN, SANTMYER ([id number]) Visit Report for 05/15/2021 Arrival Information Details Patient Name: Date of Service: [name] [name] 05/15/2021 10:30 A M Medical Record Number: [id number] Patient Account Number: [id number] Date of Birth/Sex: Treating RN: Feb 16, 1971 (50 y.o. [name] Primary Care Kanin Lia: [name] Other Clinician: Referring Nhyla Nappi: Treating Jaclynne Baldo/Extender: Herma Carson Weeks in Treatment: 4 Visit Information History Since Last Visit Added or deleted any medications: No Patient Arrived: Wheel Chair Any new allergies or adverse reactions: No Arrival Time: 10:18 Had a fall or experienced change in No Accompanied By: self activities of daily living that may affect Transfer Assistance: None risk of falls: Patient Identification Verified: Yes Signs or symptoms of abuse/neglect since last visito No Secondary Verification Process Completed: Yes Hospitalized since last visit: No Patient Requires Transmission-Based Precautions: No Implantable device outside of the clinic excluding No Patient Has Alerts: No cellular tissue based products placed in the center since last visit: Has Dressing in Place as Prescribed: Yes Has Compression in Place as Prescribed: Yes Pain Present Now: Yes Electronic Signature(s) Signed: 05/15/2021 12:59:02 PM By: Baruch Gouty RN, BSN Entered By: Baruch Gouty on 05/15/2021 10:18:56 -------------------------------------------------------------------------------- Encounter Discharge Information Details Patient Name: Date of Service: [name], [name] 05/15/2021 10:30 A M Medical Record Number: [id number] Patient Account Number: [id number] Date of Birth/Sex: Treating RN: 06/03/1970 (50 y.o. [name] Primary Care Charley Miske: [name] Other Clinician: Referring Naydene Kamrowski: Treating Blakelee Allington/Extender: Herma Carson Weeks in Treatment: 4 Encounter Discharge  Information Items Post Procedure Vitals Discharge Condition: Stable Temperature (F): 98 Ambulatory Status: Wheelchair Pulse (bpm): 74 Discharge Destination: Home Respiratory Rate (breaths/min): 18 Transportation: Private Auto Blood Pressure (mmHg): 128/76 Accompanied By: self Schedule Follow-up Appointment: Yes Clinical Summary of Care: Patient Declined Electronic Signature(s) Signed: 05/15/2021 12:59:02 PM By: Baruch Gouty RN, BSN Entered By: Baruch Gouty on 05/15/2021 10:48:26 -------------------------------------------------------------------------------- Pain Assessment Details Patient Name: Date of Service: [name], [name] 05/15/2021 10:30 A M Medical Record Number: [id number] Patient Account Number: [id number] Date of Birth/Sex: Treating RN: 1970/10/01 (50 y.o. [name] Primary Care Kimari Coudriet: [name] Other Clinician: Referring Rehmat Murtagh: Treating Floreine Kingdon/Extender: Herma Carson Weeks in Treatment: 4 Active Problems Location of Pain Severity and Description of Pain Patient Has Paino Yes Site Locations Pain Location: Pain in Ulcers With Dressing Change: Yes Duration of the Pain. Constant / Intermittento Constant Rate the pain. Current Pain Level: 5 Worst Pain Level: 7 Least Pain Level: 3 Character of Pain Describe the Pain: Aching Pain Management and Medication Current Pain Management: Medication: Yes Is the Current Pain Management Adequate: Adequate How does your wound impact your activities of daily livingo Sleep: No Bathing: No Appetite: No Relationship With Others: No Bladder Continence: No Emotions: No Bowel Continence: No Work: No Toileting: No Drive: No Dressing: No Hobbies: No Electronic Signature(s) Signed: 05/15/2021 12:59:02 PM By: Baruch Gouty RN, BSN Entered By: Baruch Gouty on 05/15/2021  10:19:49 -------------------------------------------------------------------------------- Patient/Caregiver Education Details Patient Name: Date of Service: [name], [name] 12/27/2022andnbsp10:30 A M Medical Record Number: [id number] Patient Account Number: [id number] Date of Birth/Gender: Treating RN: Apr 28, 1971 (50 y.o. [name] Primary Care Physician: [name] Other Clinician: Referring Physician: Treating Physician/Extender: Herma Carson Weeks in Treatment: 4 Education Assessment Education Provided To: Patient Education Topics Provided Venous: Methods: Explain/Verbal Responses: Reinforcements needed, State content correctly Electronic Signature(s) Signed: 05/15/2021 12:59:02 PM By: Baruch Gouty RN, BSN Entered By: Baruch Gouty on 05/15/2021 10:45:45 -------------------------------------------------------------------------------- Wound Assessment Details  Patient Name: Date of Service: [name] [name] 05/15/2021 10:30 A M Medical Record Number: [id number] Patient Account Number: [id number] Date of Birth/Sex: Treating RN: 01-29-1971 (50 y.o. [name] Primary Care Kadin Canipe: [name] Other Clinician: Referring Nora Rooke: Treating Davanta Meuser/Extender: Herma Carson Weeks in Treatment: 4 Wound Status Wound Number: 17 Primary Etiology: Sickle Cell Lesion Wound Location: Right, Circumferential Lower Leg Wound Status: Open Wounding Event: Gradually Appeared Comorbid History: Anemia, Sickle Cell Disease, Neuropathy Date Acquired: 10/05/2012 Weeks Of Treatment: 4 Clustered Wound: Yes Wound Measurements Length: (cm) 12.5 Width: (cm) 16.5 Depth: (cm) 0.3 Area: (cm) 161.988 Volume: (cm) 48.597 % Reduction in Area: 13.8% % Reduction in Volume: -158.6% Epithelialization: Small (1-33%) Tunneling: No Undermining: No Wound Description Classification: Full Thickness Without Exposed Support  Structures Wound Margin: Distinct, outline attached Exudate Amount: Large Exudate Type: Serosanguineous Exudate Color: red, brown Foul Odor After Cleansing: No Slough/Fibrino Yes Wound Bed Granulation Amount: Medium (34-66%) Exposed Structure Granulation Quality: Pink Fascia Exposed: No Necrotic Amount: Medium (34-66%) Fat Layer (Subcutaneous Tissue) Exposed: Yes Necrotic Quality: Adherent Slough Tendon Exposed: No Muscle Exposed: No Joint Exposed: No Bone Exposed: No Treatment Notes Wound #17 (Lower Leg) Wound Laterality: Right, Circumferential Cleanser Soap and Water Discharge Instruction: May shower and wash wound with dial antibacterial soap and water prior to dressing change. Wound Cleanser Discharge Instruction: Cleanse the wound with wound cleanser prior to applying a clean dressing using gauze sponges, not tissue or cotton balls. Peri-Wound Care Topical Primary Dressing Hydrofera Blue Ready Foam, 2.5 x2.5 in Discharge Instruction: Apply to wound bed as instructed Santyl Ointment Discharge Instruction: Apply nickel thick amount to wound bed as instructed Secondary Dressing ABD Pad, 5x9 Discharge Instruction: Apply over primary dressing as directed. Secured With Compression Wrap Kerlix Roll 4.5x3.1 (in/yd) Discharge Instruction: Apply Kerlix and Coban compression as directed. Coban Self-Adherent Wrap 4x5 (in/yd) Discharge Instruction: Apply over Kerlix as directed. Compression Stockings Add-Ons Electronic Signature(s) Signed: 05/15/2021 12:59:02 PM By: Baruch Gouty RN, BSN Entered By: Baruch Gouty on 05/15/2021 10:21:26 -------------------------------------------------------------------------------- Wound Assessment Details Patient Name: Date of Service: [name], [name] 05/15/2021 10:30 A M Medical Record Number: [id number] Patient Account Number: [id number] Date of Birth/Sex: Treating RN: 1970/09/29 (50 y.o. [name] Primary Care  Jamyiah Labella: [name] Other Clinician: Referring Emir Nack: Treating Jaydah Stahle/Extender: Herma Carson Weeks in Treatment: 4 Wound Status Wound Number: 18 Primary Etiology: Sickle Cell Lesion Wound Location: Left, Posterior Ankle Wound Status: Open Wounding Event: Gradually Appeared Comorbid History: Anemia, Sickle Cell Disease, Neuropathy Date Acquired: 12/25/2020 Weeks Of Treatment: 4 Clustered Wound: No Wound Measurements Length: (cm) 4.9 Width: (cm) 1.9 Depth: (cm) 0.2 Area: (cm) 7.312 Volume: (cm) 1.462 % Reduction in Area: 48.3% % Reduction in Volume: 65.5% Epithelialization: Small (1-33%) Tunneling: No Undermining: No Wound Description Classification: Full Thickness Without Exposed Support Structures Wound Margin: Distinct, outline attached Exudate Amount: Medium Exudate Type: Serosanguineous Exudate Color: red, brown Foul Odor After Cleansing: No Slough/Fibrino Yes Wound Bed Granulation Amount: Large (67-100%) Exposed Structure Granulation Quality: Pink, Hyper-granulation Fascia Exposed: No Necrotic Amount: Small (1-33%) Fat Layer (Subcutaneous Tissue) Exposed: Yes Necrotic Quality: Adherent Slough Tendon Exposed: No Muscle Exposed: No Joint Exposed: No Bone Exposed: No Treatment Notes Wound #18 (Ankle) Wound Laterality: Left, Posterior Cleanser Soap and Water Discharge Instruction: May shower and wash wound with dial antibacterial soap and water prior to dressing change. Wound Cleanser Discharge Instruction: Cleanse the wound with wound cleanser prior to applying a clean dressing using gauze sponges,  not tissue or cotton balls. Peri-Wound Care Topical Primary Dressing Hydrofera Blue Ready Foam, 2.5 x2.5 in Discharge Instruction: Apply to wound bed as instructed Santyl Ointment Discharge Instruction: Apply nickel thick amount to wound bed as instructed Secondary Dressing ABD Pad, 5x9 Discharge Instruction: Apply over primary  dressing as directed. Secured With Compression Wrap Kerlix Roll 4.5x3.1 (in/yd) Discharge Instruction: Apply Kerlix and Coban compression as directed. Coban Self-Adherent Wrap 4x5 (in/yd) Discharge Instruction: Apply over Kerlix as directed. Compression Stockings Add-Ons Electronic Signature(s) Signed: 05/15/2021 12:59:02 PM By: Baruch Gouty RN, BSN Entered By: Baruch Gouty on 05/15/2021 10:22:12 -------------------------------------------------------------------------------- Wound Assessment Details Patient Name: Date of Service: [name], [name] 05/15/2021 10:30 A M Medical Record Number: [id number] Patient Account Number: [id number] Date of Birth/Sex: Treating RN: 10/19/1970 (50 y.o. [name] Primary Care Mauri Temkin: [name] Other Clinician: Referring Miki Labuda: Treating Zaida Reiland/Extender: Herma Carson Weeks in Treatment: 4 Wound Status Wound Number: 19 Primary Etiology: Sickle Cell Lesion Wound Location: Left, Medial Ankle Wound Status: Open Wounding Event: Gradually Appeared Comorbid History: Anemia, Sickle Cell Disease, Neuropathy Date Acquired: 12/25/2020 Weeks Of Treatment: 4 Clustered Wound: No Wound Measurements Length: (cm) 5 Width: (cm) 2.9 Depth: (cm) 0.3 Area: (cm) 11.388 Volume: (cm) 3.416 % Reduction in Area: 46.3% % Reduction in Volume: 77% Epithelialization: None Tunneling: No Undermining: No Wound Description Classification: Full Thickness Without Exposed Support Structures Wound Margin: Distinct, outline attached Exudate Amount: Medium Exudate Type: Serosanguineous Exudate Color: red, brown Foul Odor After Cleansing: No Slough/Fibrino No Wound Bed Granulation Amount: Medium (34-66%) Exposed Structure Granulation Quality: Pink, Hyper-granulation Fascia Exposed: No Necrotic Amount: Medium (34-66%) Fat Layer (Subcutaneous Tissue) Exposed: Yes Necrotic Quality: Adherent Slough Tendon Exposed:  No Muscle Exposed: No Joint Exposed: No Bone Exposed: No Treatment Notes Wound #19 (Ankle) Wound Laterality: Left, Medial Cleanser Soap and Water Discharge Instruction: May shower and wash wound with dial antibacterial soap and water prior to dressing change. Wound Cleanser Discharge Instruction: Cleanse the wound with wound cleanser prior to applying a clean dressing using gauze sponges, not tissue or cotton balls. Peri-Wound Care Topical Primary Dressing Hydrofera Blue Ready Foam, 2.5 x2.5 in Discharge Instruction: Apply to wound bed as instructed Santyl Ointment Discharge Instruction: Apply nickel thick amount to wound bed as instructed Secondary Dressing ABD Pad, 5x9 Discharge Instruction: Apply over primary dressing as directed. Secured With Compression Wrap Kerlix Roll 4.5x3.1 (in/yd) Discharge Instruction: Apply Kerlix and Coban compression as directed. Coban Self-Adherent Wrap 4x5 (in/yd) Discharge Instruction: Apply over Kerlix as directed. Compression Stockings Add-Ons Electronic Signature(s) Signed: 05/15/2021 12:59:02 PM By: Baruch Gouty RN, BSN Entered By: Baruch Gouty on 05/15/2021 10:22:43 -------------------------------------------------------------------------------- Wound Assessment Details Patient Name: Date of Service: [name], [name] 05/15/2021 10:30 A M Medical Record Number: [id number] Patient Account Number: [id number] Date of Birth/Sex: Treating RN: 03/09/1971 (50 y.o. [name] Primary Care Zaliyah Meikle: [name] Other Clinician: Referring Leroy Pettway: Treating Acel Natzke/Extender: Herma Carson Weeks in Treatment: 4 Wound Status Wound Number: 20 Primary Etiology: Sickle Cell Lesion Wound Location: Left, Lateral Ankle Wound Status: Open Wounding Event: Gradually Appeared Comorbid History: Anemia, Sickle Cell Disease, Neuropathy Date Acquired: 12/25/2020 Weeks Of Treatment: 4 Clustered Wound: No Wound  Measurements Length: (cm) 1.9 Width: (cm) 0.9 Depth: (cm) 0.1 Area: (cm) 1.343 Volume: (cm) 0.134 % Reduction in Area: 47.4% % Reduction in Volume: 47.5% Epithelialization: Small (1-33%) Tunneling: No Undermining: No Wound Description Classification: Full Thickness Without Exposed Support Structures Wound Margin: Distinct, outline attached Exudate Amount: Medium  Exudate Type: Serosanguineous Exudate Color: red, brown Foul Odor After Cleansing: No Slough/Fibrino Yes Wound Bed Granulation Amount: Large (67-100%) Exposed Structure Granulation Quality: Red, Hyper-granulation Fascia Exposed: No Necrotic Amount: Small (1-33%) Fat Layer (Subcutaneous Tissue) Exposed: Yes Necrotic Quality: Adherent Slough Tendon Exposed: No Muscle Exposed: No Joint Exposed: No Bone Exposed: No Treatment Notes Wound #20 (Ankle) Wound Laterality: Left, Lateral Cleanser Soap and Water Discharge Instruction: May shower and wash wound with dial antibacterial soap and water prior to dressing change. Wound Cleanser Discharge Instruction: Cleanse the wound with wound cleanser prior to applying a clean dressing using gauze sponges, not tissue or cotton balls. Peri-Wound Care Topical Primary Dressing Hydrofera Blue Ready Foam, 2.5 x2.5 in Discharge Instruction: Apply to wound bed as instructed Santyl Ointment Discharge Instruction: Apply nickel thick amount to wound bed as instructed Secondary Dressing ABD Pad, 5x9 Discharge Instruction: Apply over primary dressing as directed. Secured With Compression Wrap Kerlix Roll 4.5x3.1 (in/yd) Discharge Instruction: Apply Kerlix and Coban compression as directed. Coban Self-Adherent Wrap 4x5 (in/yd) Discharge Instruction: Apply over Kerlix as directed. Compression Stockings Add-Ons Electronic Signature(s) Signed: 05/15/2021 12:59:02 PM By: Baruch Gouty RN, BSN Entered By: Baruch Gouty on 05/15/2021  10:23:14 -------------------------------------------------------------------------------- Vitals Details Patient Name: Date of Service: [name], [name] 05/15/2021 10:30 A M Medical Record Number: [id number] Patient Account Number: [id number] Date of Birth/Sex: Treating RN: 1970/07/27 (50 y.o. [name] Primary Care Malin Cervini: [name] Other Clinician: Referring Chapel Silverthorn: Treating Hortence Charter/Extender: Herma Carson Weeks in Treatment: 4 Vital Signs Time Taken: 10:19 Temperature (F): 98 Height (in): 67 Pulse (bpm): 74 Source: Stated Respiratory Rate (breaths/min): 18 Blood Pressure (mmHg): 128/76 Reference Range: 80 - 120 mg / dl Electronic Signature(s) Signed: 05/15/2021 12:59:02 PM By: Baruch Gouty RN, BSN Entered By: Baruch Gouty on 05/15/2021 10:20:13

## 2021-05-16 ENCOUNTER — Telehealth (HOSPITAL_COMMUNITY): Payer: Self-pay

## 2021-05-16 ENCOUNTER — Telehealth: Payer: Self-pay | Admitting: Family Medicine

## 2021-05-16 LAB — CMP AND LIVER
ALT: 24 IU/L (ref 0–32)
AST: 38 IU/L (ref 0–40)
Albumin: 4.6 g/dL (ref 3.8–4.8)
Alkaline Phosphatase: 65 IU/L (ref 44–121)
BUN: 11 mg/dL (ref 6–24)
Bilirubin Total: 2.3 mg/dL — ABNORMAL HIGH (ref 0.0–1.2)
Bilirubin, Direct: 0.46 mg/dL — ABNORMAL HIGH (ref 0.00–0.40)
CO2: 22 mmol/L (ref 20–29)
Calcium: 9.7 mg/dL (ref 8.7–10.2)
Chloride: 107 mmol/L — ABNORMAL HIGH (ref 96–106)
Creatinine, Ser: 0.54 mg/dL — ABNORMAL LOW (ref 0.57–1.00)
Glucose: 89 mg/dL (ref 70–99)
Potassium: 3.8 mmol/L (ref 3.5–5.2)
Sodium: 140 mmol/L (ref 134–144)
Total Protein: 8 g/dL (ref 6.0–8.5)
eGFR: 113 mL/min/{1.73_m2} (ref >59)

## 2021-05-16 LAB — CBC WITH DIFFERENTIAL/PLATELET
Basophils Absolute: 0.2 10*3/uL (ref 0.0–0.2)
Basos: 2 %
EOS (ABSOLUTE): 0.9 10*3/uL — ABNORMAL HIGH (ref 0.0–0.4)
Eos: 8 %
Hematocrit: 22.6 % — ABNORMAL LOW (ref 34.0–46.6)
Hemoglobin: 7.8 g/dL — ABNORMAL LOW (ref 11.1–15.9)
Immature Grans (Abs): 0 10*3/uL (ref 0.0–0.1)
Immature Granulocytes: 0 %
Lymphocytes Absolute: 2.2 10*3/uL (ref 0.7–3.1)
Lymphs: 20 %
MCH: 30 pg (ref 26.6–33.0)
MCHC: 34.5 g/dL (ref 31.5–35.7)
MCV: 87 fL (ref 79–97)
Monocytes Absolute: 1.3 10*3/uL — ABNORMAL HIGH (ref 0.1–0.9)
Monocytes: 12 %
NRBC: 1 % — ABNORMAL HIGH (ref 0–0)
Neutrophils Absolute: 6.3 10*3/uL (ref 1.4–7.0)
Neutrophils: 58 %
Platelets: 448 10*3/uL (ref 150–450)
RBC: 2.6 x10E6/uL — CL (ref 3.77–5.28)
RDW: 15.5 % — ABNORMAL HIGH (ref 11.7–15.4)
WBC: 10.9 10*3/uL — ABNORMAL HIGH (ref 3.4–10.8)

## 2021-05-16 NOTE — Telephone Encounter (Signed)
[name] is a 50 year old female with a medical history significant for sickle cell disease, chronic pain syndrome, opiate dependence and tolerance, and nonhealing lower extremity ulcers that presented on 05/15/2021 for labs.  At that time, hemoglobin was found to be 7.8 g/dL.  Goal is to maintain patient's hemoglobin above 8.0 g/dL to promote lower extremity ulcer healing.  Will transfuse 1 unit of packed red blood cells on 05/18/2021.  Patient will return to clinic for labs 1 week post blood transfusion.  [name]  APRN, MSN, FNP-C [facility] [address] [place], [place] [address] [phone]

## 2021-05-16 NOTE — Telephone Encounter (Signed)
Spoke with pt, informed her that [place] FNP is referring her to the [facility] for 1 unit of PRBC. Reviewed date and time with patient, Friday Dec. 30th at 8:00am, pt verbalized understanding.

## 2021-05-17 NOTE — Progress Notes (Signed)
[name], [name] ([id number]) Visit Report for 05/15/2021 Debridement Details Patient Name: Date of Service: [name] [name] 05/15/2021 10:30 A M Medical Record Number: [id number] Patient Account Number: [id number] Date of Birth/Sex: Treating RN: 31-Jul-1970 (50 y.o. [name] Primary Care Provider: [name] Other Clinician: Referring Provider: Treating Provider/Extender: Herma Carson Weeks in Treatment: 4 Debridement Performed for Assessment: Wound #17 Right,Circumferential Lower Leg Performed By: Clinician Baruch Gouty, RN Debridement Type: Chemical/Enzymatic/Mechanical Agent Used: Santyl Level of Consciousness (Pre-procedure): Awake and Alert Pre-procedure Verification/Time Out No Taken: Bleeding: None Response to Treatment: Procedure was tolerated well Level of Consciousness (Post- Awake and Alert procedure): Post Debridement Measurements of Total Wound Length: (cm) 12.5 Width: (cm) 16.5 Depth: (cm) 0.1 Volume: (cm) 16.199 Character of Wound/Ulcer Post Debridement: Requires Further Debridement Electronic Signature(s) Signed: 05/15/2021 12:59:02 PM By: Baruch Gouty RN, BSN Signed: 05/17/2021 11:32:23 AM By: Kalman Shan DO Entered By: Baruch Gouty on 05/15/2021 10:24:12 -------------------------------------------------------------------------------- Debridement Details Patient Name: Date of Service: [name] [name] 05/15/2021 10:30 A M Medical Record Number: [id number] Patient Account Number: [id number] Date of Birth/Sex: Treating RN: 30-Jan-1971 (50 y.o. [name] Primary Care Provider: [name] Other Clinician: Referring Provider: Treating Provider/Extender: Herma Carson Weeks in Treatment: 4 Debridement Performed for Assessment: Wound #18 Left,Posterior Ankle Performed By: Clinician Baruch Gouty, RN Debridement Type: Chemical/Enzymatic/Mechanical Agent Used: Santyl Level of  Consciousness (Pre-procedure): Awake and Alert Pre-procedure Verification/Time Out No Taken: Bleeding: None Response to Treatment: Procedure was tolerated well Level of Consciousness (Post- Awake and Alert procedure): Post Debridement Measurements of Total Wound Length: (cm) 4.9 Width: (cm) 1.9 Depth: (cm) 0.1 Volume: (cm) 0.731 Character of Wound/Ulcer Post Debridement: Requires Further Debridement Electronic Signature(s) Signed: 05/15/2021 12:59:02 PM By: Baruch Gouty RN, BSN Signed: 05/17/2021 11:32:23 AM By: Kalman Shan DO Entered By: Baruch Gouty on 05/15/2021 10:24:40 -------------------------------------------------------------------------------- Debridement Details Patient Name: Date of Service: [name] [name] 05/15/2021 10:30 A M Medical Record Number: [id number] Patient Account Number: [id number] Date of Birth/Sex: Treating RN: July 19, 1970 (50 y.o. [name] Primary Care Provider: [name] Other Clinician: Referring Provider: Treating Provider/Extender: Herma Carson Weeks in Treatment: 4 Debridement Performed for Assessment: Wound #19 Left,Medial Ankle Performed By: Clinician Baruch Gouty, RN Debridement Type: Chemical/Enzymatic/Mechanical Agent Used: Santyl Level of Consciousness (Pre-procedure): Awake and Alert Pre-procedure Verification/Time Out No Taken: Bleeding: None Response to Treatment: Procedure was tolerated well Level of Consciousness (Post- Awake and Alert procedure): Post Debridement Measurements of Total Wound Length: (cm) 5 Width: (cm) 2.9 Depth: (cm) 0.1 Volume: (cm) 1.139 Character of Wound/Ulcer Post Debridement: Requires Further Debridement Electronic Signature(s) Signed: 05/15/2021 12:59:02 PM By: Baruch Gouty RN, BSN Signed: 05/17/2021 11:32:23 AM By: Kalman Shan DO Entered By: Baruch Gouty on 05/15/2021  10:25:16 -------------------------------------------------------------------------------- Debridement Details Patient Name: Date of Service: [name] [name] 05/15/2021 10:30 A M Medical Record Number: [id number] Patient Account Number: [id number] Date of Birth/Sex: Treating RN: 08-22-1970 (50 y.o. [name] Primary Care Provider: [name] Other Clinician: Referring Provider: Treating Provider/Extender: Herma Carson Weeks in Treatment: 4 Debridement Performed for Assessment: Wound #20 Left,Lateral Ankle Performed By: Clinician Baruch Gouty, RN Debridement Type: Chemical/Enzymatic/Mechanical Agent Used: Santyl Level of Consciousness (Pre-procedure): Awake and Alert Pre-procedure Verification/Time Out No Taken: Bleeding: None Response to Treatment: Procedure was tolerated well Level of Consciousness (Post- Awake and Alert procedure): Post Debridement Measurements of Total Wound Length: (cm) 1.9 Width: (cm) 0.9 Depth: (cm) 0.1 Volume: (cm) 0.134 Character  of Wound/Ulcer Post Debridement: Requires Further Debridement Electronic Signature(s) Signed: 05/15/2021 12:59:02 PM By: Baruch Gouty RN, BSN Signed: 05/17/2021 11:32:23 AM By: Kalman Shan DO Entered By: Baruch Gouty on 05/15/2021 10:25:41 -------------------------------------------------------------------------------- SuperBill Details Patient Name: Date of Service: [name] [name] 05/15/2021 Medical Record Number: [id number] Patient Account Number: [id number] Date of Birth/Sex: Treating RN: Feb 07, 1971 (50 y.o. [name] Primary Care Provider: [name] Other Clinician: Referring Provider: Treating Provider/Extender: Herma Carson Weeks in Treatment: 4 Diagnosis Coding ICD-10 Codes Code Description [phone] Non-pressure chronic ulcer of other part of right lower leg with other specified severity L97.828 Non-pressure chronic  ulcer of other part of left lower leg with other specified severity 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 Facility Procedures CPT4 Code: [id number] Description: 24462 - DEBRIDE W/O ANES NON SELECT Modifier: Quantity: 1 Electronic Signature(s) Signed: 05/15/2021 12:59:02 PM By: Baruch Gouty RN, BSN Signed: 05/17/2021 11:32:23 AM By: Kalman Shan DO Entered By: Baruch Gouty on 05/15/2021 10:48:37

## 2021-05-18 ENCOUNTER — Other Ambulatory Visit: Payer: Self-pay | Admitting: Family Medicine

## 2021-05-18 ENCOUNTER — Non-Acute Institutional Stay (HOSPITAL_COMMUNITY)
Admission: RE | Admit: 2021-05-18 | Discharge: 2021-05-18 | Disposition: A | Discharge status: Home / Self Care | Payer: Medicaid Other | Source: Ambulatory Visit | Attending: Internal Medicine | Admitting: Internal Medicine

## 2021-05-18 ENCOUNTER — Other Ambulatory Visit: Payer: Self-pay

## 2021-05-18 DIAGNOSIS — D571 Sickle-cell disease without crisis: Secondary | ICD-10-CM | POA: Insufficient documentation

## 2021-05-18 LAB — HEMOGLOBIN AND HEMATOCRIT, BLOOD
HCT: 23.2 % — ABNORMAL LOW (ref 36.0–46.0)
Hemoglobin: 7.7 g/dL — ABNORMAL LOW (ref 12.0–15.0)

## 2021-05-18 LAB — CBC
HCT: 24.7 % — ABNORMAL LOW (ref 36.0–46.0)
Hemoglobin: 8.2 g/dL — ABNORMAL LOW (ref 12.0–15.0)
MCH: 29.8 pg (ref 26.0–34.0)
MCHC: 33.2 g/dL (ref 30.0–36.0)
MCV: 89.8 fL (ref 80.0–100.0)
Platelets: 401 10*3/uL — ABNORMAL HIGH (ref 150–400)
RBC: 2.75 MIL/uL — ABNORMAL LOW (ref 3.87–5.11)
RDW: 17.9 % — ABNORMAL HIGH (ref 11.5–15.5)
WBC: 10.8 10*3/uL — ABNORMAL HIGH (ref 4.0–10.5)
nRBC: 2.2 % — ABNORMAL HIGH (ref 0.0–0.2)

## 2021-05-18 LAB — PREPARE RBC (CROSSMATCH)

## 2021-05-18 MED ORDER — DIPHENHYDRAMINE HCL 25 MG PO CAPS
25.0000 mg | ORAL_CAPSULE | Freq: Once | ORAL | Status: AC
  Administered 2021-05-18: 10:00:00 25 mg via ORAL
  Filled 2021-05-18: qty 1

## 2021-05-18 MED ORDER — ACETAMINOPHEN 325 MG PO TABS
650.0000 mg | ORAL_TABLET | Freq: Once | ORAL | Status: AC
  Administered 2021-05-18: 10:00:00 650 mg via ORAL
  Filled 2021-05-18: qty 2

## 2021-05-18 MED ORDER — SODIUM CHLORIDE 0.9% IV SOLUTION: Freq: Once | INTRAVENOUS | Status: AC

## 2021-05-18 NOTE — Progress Notes (Signed)
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 Type & Screen and H&H drawn. Hemoglobin 7.7 pre transfusion. Pre medications given (PO Tylenol and Benadryl) per order. Patient tolerated transfusion well with no adverse reaction. Vital signs remained stable throughout transfusion. Post transfusion CBC drawn.  Patient alert, oriented and transfers in wheelchair at discharge.

## 2021-05-20 LAB — TYPE AND SCREEN
ABO/RH(D): B POS
Antibody Screen: NEGATIVE
Unit division: 0

## 2021-05-20 LAB — BPAM RBC
Blood Product Expiration Date: 202301182359
ISSUE DATE / TIME: 202212301029
Unit Type and Rh: 5100

## 2021-05-22 ENCOUNTER — Encounter (HOSPITAL_BASED_OUTPATIENT_CLINIC_OR_DEPARTMENT_OTHER): Payer: Medicaid Other | Admitting: Internal Medicine

## 2021-05-22 ENCOUNTER — Encounter (HOSPITAL_BASED_OUTPATIENT_CLINIC_OR_DEPARTMENT_OTHER): Payer: Medicaid Other | Attending: Internal Medicine | Admitting: Internal Medicine

## 2021-05-22 ENCOUNTER — Other Ambulatory Visit: Payer: Self-pay

## 2021-05-22 DIAGNOSIS — L97828 Non-pressure chronic ulcer of other part of left lower leg with other specified severity: Secondary | ICD-10-CM | POA: Insufficient documentation

## 2021-05-22 DIAGNOSIS — I82531 Chronic embolism and thrombosis of right popliteal vein: Secondary | ICD-10-CM | POA: Diagnosis not present

## 2021-05-22 DIAGNOSIS — D571 Sickle-cell disease without crisis: Secondary | ICD-10-CM | POA: Diagnosis not present

## 2021-05-22 DIAGNOSIS — L97818 Non-pressure chronic ulcer of other part of right lower leg with other specified severity: Secondary | ICD-10-CM | POA: Insufficient documentation

## 2021-05-22 NOTE — Progress Notes (Signed)
TAQUILA, LEYS ([id number]) Visit Report for 05/22/2021 Arrival Information Details Patient Name: Date of Service: [name] 05/22/2021 10:30 A M Medical Record Number: [id number] Patient Account Number: [id number] Date of Birth/Sex: Treating RN: 1971/04/18 (50 y.o. Sue Lush Primary Care Jaedin Trumbo: [name] Other Clinician: Referring Shadrach Bartunek: Treating Blanch Stang/Extender: Bernette Redbird in Treatment: 5 Visit Information History Since Last Visit Added or deleted any medications: No Patient Arrived: Wheel Chair Any new allergies or adverse reactions: No Arrival Time: 10:41 Had a fall or experienced change in No Transfer Assistance: None activities of daily living that may affect Patient Identification Verified: Yes risk of falls: Secondary Verification Process Completed: Yes Signs or symptoms of abuse/neglect since last visito No Patient Requires Transmission-Based Precautions: No Hospitalized since last visit: No Patient Has Alerts: No Implantable device outside of the clinic excluding No cellular tissue based products placed in the center since last visit: Has Dressing in Place as Prescribed: Yes Has Compression in Place as Prescribed: Yes Pain Present Now: Yes Electronic Signature(s) Signed: 05/22/2021 3:19:23 PM By: Lorrin Jackson Entered By: Lorrin Jackson on 05/22/2021 10:46:24 -------------------------------------------------------------------------------- Clinic Level of Care Assessment Details Patient Name: Date of Service: [name] 05/22/2021 10:30 A M Medical Record Number: [id number] Patient Account Number: [id number] Date of Birth/Sex: Treating RN: [name], 1970 (51 y.o. Sue Lush Primary Care Sanvi Ehler: [name] Other Clinician: Referring Brittinee Risk: Treating Idalys Konecny/Extender: Bernette Redbird in Treatment: 5 Clinic Level of Care Assessment Items TOOL 4 Quantity Score X- 1 0 Use  when only an EandM is performed on FOLLOW-UP visit ASSESSMENTS - Nursing Assessment / Reassessment X- 1 10 Reassessment of Co-morbidities (includes updates in patient status) X- 1 5 Reassessment of Adherence to Treatment Plan ASSESSMENTS - Wound and Skin A ssessment / Reassessment []  - 0 Simple Wound Assessment / Reassessment - one wound X- 4 5 Complex Wound Assessment / Reassessment - multiple wounds []  - 0 Dermatologic / Skin Assessment (not related to wound area) ASSESSMENTS - Focused Assessment []  - 0 Circumferential Edema Measurements - multi extremities []  - 0 Nutritional Assessment / Counseling / Intervention []  - 0 Lower Extremity Assessment (monofilament, tuning fork, pulses) []  - 0 Peripheral Arterial Disease Assessment (using hand held doppler) ASSESSMENTS - Ostomy and/or Continence Assessment and Care []  - 0 Incontinence Assessment and Management []  - 0 Ostomy Care Assessment and Management (repouching, etc.) PROCESS - Coordination of Care []  - 0 Simple Patient / Family Education for ongoing care X- 1 20 Complex (extensive) Patient / Family Education for ongoing care []  - 0 Staff obtains Programmer, systems, Records, T Results / Process Orders est []  - 0 Staff telephones HHA, Nursing Homes / Clarify orders / etc []  - 0 Routine Transfer to another Facility (non-emergent condition) []  - 0 Routine Hospital Admission (non-emergent condition) []  - 0 New Admissions / Biomedical engineer / Ordering NPWT Apligraf, etc. , []  - 0 Emergency Hospital Admission (emergent condition) []  - 0 Simple Discharge Coordination []  - 0 Complex (extensive) Discharge Coordination PROCESS - Special Needs []  - 0 Pediatric / Minor Patient Management []  - 0 Isolation Patient Management []  - 0 Hearing / Language / Visual special needs []  - 0 Assessment of Community assistance (transportation, D/C planning, etc.) []  - 0 Additional assistance / Altered mentation []  - 0 Support  Surface(s) Assessment (bed, cushion, seat, etc.) INTERVENTIONS - Wound Cleansing / Measurement []  - 0 Simple Wound Cleansing - one wound X- 4 5 Complex Wound Cleansing -  multiple wounds X- 1 5 Wound Imaging (photographs - any number of wounds) []  - 0 Wound Tracing (instead of photographs) []  - 0 Simple Wound Measurement - one wound X- 4 5 Complex Wound Measurement - multiple wounds INTERVENTIONS - Wound Dressings []  - 0 Small Wound Dressing one or multiple wounds []  - 0 Medium Wound Dressing one or multiple wounds X- 2 20 Large Wound Dressing one or multiple wounds []  - 0 Application of Medications - topical []  - 0 Application of Medications - injection INTERVENTIONS - Miscellaneous []  - 0 External ear exam X- 1 5 Specimen Collection (cultures, biopsies, blood, body fluids, etc.) X- 1 5 Specimen(s) / Culture(s) sent or taken to Lab for analysis []  - 0 Patient Transfer (multiple staff / Civil Service fast streamer / Similar devices) []  - 0 Simple Staple / Suture removal (25 or less) []  - 0 Complex Staple / Suture removal (26 or more) []  - 0 Hypo / Hyperglycemic Management (close monitor of Blood Glucose) []  - 0 Ankle / Brachial Index ([name]) - do not check if billed separately X- 1 5 Vital Signs Has the patient been seen at the hospital within the last three years: Yes Total Score: 155 Level Of Care: New/Established - Level 4 Electronic Signature(s) Signed: 05/22/2021 3:19:23 PM By: Lorrin Jackson Entered By: Lorrin Jackson on 05/22/2021 11:26:51 -------------------------------------------------------------------------------- Encounter Discharge Information Details Patient Name: Date of Service: [name], [name] 05/22/2021 10:30 A M Medical Record Number: [id number] Patient Account Number: [id number] Date of Birth/Sex: Treating RN: [birth year] (51 y.o. Sue Lush Primary Care Camilla Skeen: [name] Other Clinician: Referring Kevon Tench: Treating Beva Remund/Extender: Bernette Redbird in Treatment: 5 Encounter Discharge Information Items Discharge Condition: Stable Ambulatory Status: Wheelchair Discharge Destination: Home Transportation: Private Auto Schedule Follow-up Appointment: Yes Clinical Summary of Care: Provided on 05/22/2021 Form Type Recipient Paper Patient Patient Electronic Signature(s) Signed: 05/22/2021 11:55:06 AM By: Lorrin Jackson Entered By: Lorrin Jackson on 05/22/2021 11:55:06 -------------------------------------------------------------------------------- Lower Extremity Assessment Details Patient Name: Date of Service: [name], [place] [name] 05/22/2021 10:30 A M Medical Record Number: [id number] Patient Account Number: [id number] Date of Birth/Sex: Treating RN: 1970/09/15 (50 y.o. Sue Lush Primary Care Coreena Rubalcava: [name] Other Clinician: Referring Eldonna Neuenfeldt: Treating Darrion Wyszynski/Extender: Farrel Demark Weeks in Treatment: 5 Edema Assessment Assessed: Shirlyn Goltz: Yes] [Right: Yes] Edema: [Left: No] [Right: No] Calf Left: Right: Point of Measurement: 33 cm From Medial Instep 25 cm 26 cm Ankle Left: Right: Point of Measurement: 10 cm From Medial Instep 17 cm 21 cm Vascular Assessment Pulses: Dorsalis Pedis Palpable: [Left:Yes] [Right:Yes] Electronic Signature(s) Signed: 05/22/2021 3:19:23 PM By: Lorrin Jackson Entered By: Lorrin Jackson on 05/22/2021 10:59:25 -------------------------------------------------------------------------------- Multi Wound Chart Details Patient Name: Date of Service: [name], [name] 05/22/2021 10:30 A M Medical Record Number: [id number] Patient Account Number: [id number] Date of Birth/Sex: Treating RN: November 23, 1970 (51 y.o. [name] Primary Care Mattisen Pohlmann: [name] Other Clinician: Referring Delena Casebeer: Treating Shenae Bonanno/Extender: Farrel Demark Weeks in Treatment: 5 Vital Signs Height(in): 62 Pulse(bpm):  66 Weight(lbs): Blood Pressure(mmHg): 131/68 Body Mass Index(BMI): Temperature(F): 98.2 Respiratory Rate(breaths/min): 16 Photos: Right, Circumferential Lower Leg Left, Posterior Ankle Left, Medial Ankle Wound Location: Gradually Appeared Gradually Appeared Gradually Appeared Wounding Event: Sickle Cell Lesion Sickle Cell Lesion Sickle Cell Lesion Primary Etiology: Anemia, Sickle Cell Disease, Anemia, Sickle Cell Disease, Anemia, Sickle Cell Disease, Comorbid History: Neuropathy Neuropathy Neuropathy 10/05/2012 12/25/2020 12/25/2020 Date Acquired: 5 5 5  Weeks of Treatment: Open Open Open Wound Status:  Yes No No Clustered Wound: 10x16.5x0.2 4x1.9x0.1 4x3.5x0.1 Measurements L x W x D (cm) 129.591 5.969 10.996 A (cm) : rea 25.918 0.597 1.1 Volume (cm) : 31.00% 57.80% 48.10% % Reduction in Area: -37.90% 85.90% 92.60% % Reduction in Volume: Full Thickness Without Exposed Full Thickness Without Exposed Full Thickness Without Exposed Classification: Support Structures Support Structures Support Structures Large Medium Medium Exudate Amount: Purulent Serosanguineous Serosanguineous Exudate Type: yellow, brown, green red, brown red, brown Exudate Color: Distinct, outline attached Distinct, outline attached Distinct, outline attached Wound Margin: Large (67-100%) Large (67-100%) Large (67-100%) Granulation Amount: Pink Pink, Hyper-granulation Pink, Hyper-granulation Granulation Quality: Small (1-33%) Small (1-33%) Small (1-33%) Necrotic Amount: Fat Layer (Subcutaneous Tissue): Yes Fat Layer (Subcutaneous Tissue): Yes Fat Layer (Subcutaneous Tissue): Yes Exposed Structures: Fascia: No Fascia: No Fascia: No Tendon: No Tendon: No Tendon: No Muscle: No Muscle: No Muscle: No Joint: No Joint: No Joint: No Bone: No Bone: No Bone: No Medium (34-66%) Medium (34-66%) Medium (34-66%) Epithelialization: Wound Number: 20 N/A N/A Photos: N/A N/A Left, Lateral Ankle N/A  N/A Wound Location: Gradually Appeared N/A N/A Wounding Event: Sickle Cell Lesion N/A N/A Primary Etiology: Anemia, Sickle Cell Disease, N/A N/A Comorbid History: Neuropathy 12/25/2020 N/A N/A Date Acquired: 5 N/A N/A Weeks of Treatment: Open N/A N/A Wound Status: No N/A N/A Clustered Wound: 0.8x0.6x0.1 N/A N/A Measurements L x W x D (cm) 0.377 N/A N/A A (cm) : rea 0.038 N/A N/A Volume (cm) : 85.20% N/A N/A % Reduction in Area: 85.10% N/A N/A % Reduction in Volume: Full Thickness Without Exposed N/A N/A Classification: Support Structures Medium N/A N/A Exudate Amount: Serosanguineous N/A N/A Exudate Type: red, brown N/A N/A Exudate Color: Distinct, outline attached N/A N/A Wound Margin: Large (67-100%) N/A N/A Granulation Amount: Red, Hyper-granulation N/A N/A Granulation Quality: None Present (0%) N/A N/A Necrotic Amount: Fat Layer (Subcutaneous Tissue): Yes N/A N/A Exposed Structures: Fascia: No Tendon: No Muscle: No Joint: No Bone: No Medium (34-66%) N/A N/A Epithelialization: Treatment Notes Electronic Signature(s) Signed: 05/22/2021 3:57:23 PM By: Linton Ham MD Signed: 05/22/2021 5:47:12 PM By: Levan Hurst RN, BSN Entered By: Linton Ham on 05/22/2021 11:37:49 -------------------------------------------------------------------------------- Multi-Disciplinary Care Plan Details Patient Name: Date of Service: [name], [name] 05/22/2021 10:30 A M Medical Record Number: [id number] Patient Account Number: [id number] Date of Birth/Sex: Treating RN: 27-Jun-1970 (51 y.o. Sue Lush Primary Care Betzy Barbier: [name] Other Clinician: Referring Joannah Gitlin: Treating Yazlynn Birkeland/Extender: Farrel Demark Weeks in Treatment: 5 Active Inactive Wound/Skin Impairment Nursing Diagnoses: Impaired tissue integrity Goals: Patient/caregiver will verbalize understanding of skin care regimen Date Initiated: 04/17/2021 Target  Resolution Date: 06/26/2021 Goal Status: Active Ulcer/skin breakdown will have a volume reduction of 30% by week 4 Date Initiated: 04/17/2021 Target Resolution Date: 05/15/2021 Goal Status: Active Interventions: Assess patient/caregiver ability to obtain necessary supplies Assess patient/caregiver ability to perform ulcer/skin care regimen upon admission and as needed Assess ulceration(s) every visit Provide education on ulcer and skin care Treatment Activities: Topical wound management initiated : 04/17/2021 Notes: Electronic Signature(s) Signed: 05/22/2021 3:19:23 PM By: Lorrin Jackson Entered By: Lorrin Jackson on 05/22/2021 10:41:11 -------------------------------------------------------------------------------- Pain Assessment Details Patient Name: Date of Service: [name], [name] 05/22/2021 10:30 A M Medical Record Number: [id number] Patient Account Number: [id number] Date of Birth/Sex: Treating RN: Jan 28, 1971 (51 y.o. Sue Lush Primary Care Jaquavius Hudler: [name] Other Clinician: Referring Melaya Hoselton: Treating Schneider Warchol/Extender: Farrel Demark Weeks in Treatment: 5 Active Problems Location of Pain Severity and Description of Pain Patient Has  Paino Yes Site Locations Pain Location: Pain in Ulcers With Dressing Change: Yes Duration of the Pain. Constant / Intermittento Intermittent Rate the pain. Current Pain Level: 5 Character of Pain Describe the Pain: Burning, Throbbing Pain Management and Medication Current Pain Management: Medication: Yes Cold Application: No Rest: Yes Massage: No Activity: No T.E.N.S.: No Heat Application: No Leg drop or elevation: No Is the Current Pain Management Adequate: Adequate How does your wound impact your activities of daily livingo Sleep: No Bathing: No Appetite: No Relationship With Others: No Bladder Continence: No Emotions: No Bowel Continence: No Work: No Toileting: No Drive:  No Dressing: No Hobbies: No Electronic Signature(s) Signed: 05/22/2021 3:19:23 PM By: Lorrin Jackson Entered By: Lorrin Jackson on 05/22/2021 10:46:56 -------------------------------------------------------------------------------- Patient/Caregiver Education Details Patient Name: Date of Service: [name], [name] 1/3/2023andnbsp10:30 A M Medical Record Number: [id number] Patient Account Number: [id number] Date of Birth/Gender: Treating RN: [birth year] (51 y.o. Sue Lush Primary Care Physician: [name] Other Clinician: Referring Physician: Treating Physician/Extender: Bernette Redbird in Treatment: 5 Education Assessment Education Provided To: Patient Education Topics Provided Venous: Methods: Explain/Verbal, Printed Responses: State content correctly Wound/Skin Impairment: Methods: Explain/Verbal, Printed Responses: State content correctly Electronic Signature(s) Signed: 05/22/2021 3:19:23 PM By: Lorrin Jackson Entered By: Lorrin Jackson on 05/22/2021 10:41:39 -------------------------------------------------------------------------------- Wound Assessment Details Patient Name: Date of Service: [name], [name] 05/22/2021 10:30 A M Medical Record Number: [id number] Patient Account Number: [id number] Date of Birth/Sex: Treating RN: 1971-02-16 (51 y.o. Sue Lush Primary Care Hadiyah Maricle: [name] Other Clinician: Referring Artur Winningham: Treating Sly Parlee/Extender: Farrel Demark Weeks in Treatment: 5 Wound Status Wound Number: 17 Primary Etiology: Sickle Cell Lesion Wound Location: Right, Circumferential Lower Leg Wound Status: Open Wounding Event: Gradually Appeared Comorbid History: Anemia, Sickle Cell Disease, Neuropathy Date Acquired: 10/05/2012 Weeks Of Treatment: 5 Clustered Wound: Yes Photos Wound Measurements Length: (cm) 10 Width: (cm) 16.5 Depth: (cm) 0.2 Area: (cm) 129.591 Volume: (cm)  25.918 % Reduction in Area: 31% % Reduction in Volume: -37.9% Epithelialization: Medium (34-66%) Tunneling: No Undermining: No Wound Description Classification: Full Thickness Without Exposed Support Structures Wound Margin: Distinct, outline attached Exudate Amount: Large Exudate Type: Purulent Exudate Color: yellow, brown, green Foul Odor After Cleansing: No Slough/Fibrino Yes Wound Bed Granulation Amount: Large (67-100%) Exposed Structure Granulation Quality: Pink Fascia Exposed: No Necrotic Amount: Small (1-33%) Fat Layer (Subcutaneous Tissue) Exposed: Yes Necrotic Quality: Adherent Slough Tendon Exposed: No Muscle Exposed: No Joint Exposed: No Bone Exposed: No Treatment Notes Wound #17 (Lower Leg) Wound Laterality: Right, Circumferential Cleanser Soap and Water Discharge Instruction: May shower and wash wound with dial antibacterial soap and water prior to dressing change. Wound Cleanser Discharge Instruction: Cleanse the wound with wound cleanser prior to applying a clean dressing using gauze sponges, not tissue or cotton balls. Peri-Wound Care Topical Primary Dressing Hydrofera Blue Ready Foam, 2.5 x2.5 in Discharge Instruction: Apply to wound bed as instructed Santyl Ointment Discharge Instruction: Apply nickel thick amount to wound bed as instructed Secondary Dressing ABD Pad, 5x9 Discharge Instruction: Apply over primary dressing as directed. Secured With Compression Wrap Kerlix Roll 4.5x3.1 (in/yd) Discharge Instruction: Apply Kerlix and Coban compression as directed. Coban Self-Adherent Wrap 4x5 (in/yd) Discharge Instruction: Apply over Kerlix as directed. Compression Stockings Add-Ons Electronic Signature(s) Signed: 05/22/2021 3:19:23 PM By: Lorrin Jackson Entered By: Lorrin Jackson on 05/22/2021 11:08:46 -------------------------------------------------------------------------------- Wound Assessment Details Patient Name: Date of Service: [name], [name] 05/22/2021 10:30 A M Medical Record Number: [id number] Patient  Account Number: [id number] Date of Birth/Sex: Treating RN: Dec 07, 1970 (51 y.o. Sue Lush Primary Care Steph Cheadle: [name] Other Clinician: Referring Imane Burrough: Treating Patrice Matthew/Extender: Farrel Demark Weeks in Treatment: 5 Wound Status Wound Number: 18 Primary Etiology: Sickle Cell Lesion Wound Location: Left, Posterior Ankle Wound Status: Open Wounding Event: Gradually Appeared Comorbid History: Anemia, Sickle Cell Disease, Neuropathy Date Acquired: 12/25/2020 Weeks Of Treatment: 5 Clustered Wound: No Photos Wound Measurements Length: (cm) 4 Width: (cm) 1.9 Depth: (cm) 0.1 Area: (cm) 5.969 Volume: (cm) 0.597 % Reduction in Area: 57.8% % Reduction in Volume: 85.9% Epithelialization: Medium (34-66%) Tunneling: No Undermining: No Wound Description Classification: Full Thickness Without Exposed Support Structures Wound Margin: Distinct, outline attached Exudate Amount: Medium Exudate Type: Serosanguineous Exudate Color: red, brown Foul Odor After Cleansing: No Slough/Fibrino Yes Wound Bed Granulation Amount: Large (67-100%) Exposed Structure Granulation Quality: Pink, Hyper-granulation Fascia Exposed: No Necrotic Amount: Small (1-33%) Fat Layer (Subcutaneous Tissue) Exposed: Yes Necrotic Quality: Adherent Slough Tendon Exposed: No Muscle Exposed: No Joint Exposed: No Bone Exposed: No Treatment Notes Wound #18 (Ankle) Wound Laterality: Left, Posterior Cleanser Soap and Water Discharge Instruction: May shower and wash wound with dial antibacterial soap and water prior to dressing change. Wound Cleanser Discharge Instruction: Cleanse the wound with wound cleanser prior to applying a clean dressing using gauze sponges, not tissue or cotton balls. Peri-Wound Care Topical Primary Dressing Hydrofera Blue Ready Foam, 2.5 x2.5 in Discharge Instruction: Apply to  wound bed as instructed Santyl Ointment Discharge Instruction: Apply nickel thick amount to wound bed as instructed Secondary Dressing ABD Pad, 5x9 Discharge Instruction: Apply over primary dressing as directed. Secured With Compression Wrap Kerlix Roll 4.5x3.1 (in/yd) Discharge Instruction: Apply Kerlix and Coban compression as directed. Coban Self-Adherent Wrap 4x5 (in/yd) Discharge Instruction: Apply over Kerlix as directed. Compression Stockings Add-Ons Electronic Signature(s) Signed: 05/22/2021 3:19:23 PM By: Lorrin Jackson Entered By: Lorrin Jackson on 05/22/2021 11:09:32 -------------------------------------------------------------------------------- Wound Assessment Details Patient Name: Date of Service: [name], [name] 05/22/2021 10:30 A M Medical Record Number: [id number] Patient Account Number: [id number] Date of Birth/Sex: Treating RN: August 02, 1970 (51 y.o. Sue Lush Primary Care Dyneisha Murchison: [name] Other Clinician: Referring Lealand Elting: Treating Itxel Wickard/Extender: Farrel Demark Weeks in Treatment: 5 Wound Status Wound Number: 19 Primary Etiology: Sickle Cell Lesion Wound Location: Left, Medial Ankle Wound Status: Open Wounding Event: Gradually Appeared Comorbid History: Anemia, Sickle Cell Disease, Neuropathy Date Acquired: 12/25/2020 Weeks Of Treatment: 5 Clustered Wound: No Photos Wound Measurements Length: (cm) 4 Width: (cm) 3.5 Depth: (cm) 0.1 Area: (cm) 10.996 Volume: (cm) 1.1 % Reduction in Area: 48.1% % Reduction in Volume: 92.6% Epithelialization: Medium (34-66%) Tunneling: No Undermining: No Wound Description Classification: Full Thickness Without Exposed Support Structures Wound Margin: Distinct, outline attached Exudate Amount: Medium Exudate Type: Serosanguineous Exudate Color: red, brown Foul Odor After Cleansing: No Slough/Fibrino Yes Wound Bed Granulation Amount: Large (67-100%) Exposed  Structure Granulation Quality: Pink, Hyper-granulation Fascia Exposed: No Necrotic Amount: Small (1-33%) Fat Layer (Subcutaneous Tissue) Exposed: Yes Necrotic Quality: Adherent Slough Tendon Exposed: No Muscle Exposed: No Joint Exposed: No Bone Exposed: No Treatment Notes Wound #19 (Ankle) Wound Laterality: Left, Medial Cleanser Soap and Water Discharge Instruction: May shower and wash wound with dial antibacterial soap and water prior to dressing change. Wound Cleanser Discharge Instruction: Cleanse the wound with wound cleanser prior to applying a clean dressing using gauze sponges, not tissue or cotton balls. Peri-Wound Care Topical Primary Dressing Hydrofera Blue Ready Foam, 2.5 x2.5 in Discharge Instruction: Apply  to wound bed as instructed Santyl Ointment Discharge Instruction: Apply nickel thick amount to wound bed as instructed Secondary Dressing ABD Pad, 5x9 Discharge Instruction: Apply over primary dressing as directed. Secured With Compression Wrap Kerlix Roll 4.5x3.1 (in/yd) Discharge Instruction: Apply Kerlix and Coban compression as directed. Coban Self-Adherent Wrap 4x5 (in/yd) Discharge Instruction: Apply over Kerlix as directed. Compression Stockings Add-Ons Electronic Signature(s) Signed: 05/22/2021 3:19:23 PM By: Lorrin Jackson Entered By: Lorrin Jackson on 05/22/2021 11:11:22 -------------------------------------------------------------------------------- Wound Assessment Details Patient Name: Date of Service: [name], [name] 05/22/2021 10:30 A M Medical Record Number: [id number] Patient Account Number: [id number] Date of Birth/Sex: Treating RN: 01-17-1971 (51 y.o. Sue Lush Primary Care Wylie Coon: [name] Other Clinician: Referring Eason Housman: Treating Aamiyah Derrick/Extender: Farrel Demark Weeks in Treatment: 5 Wound Status Wound Number: 20 Primary Etiology: Sickle Cell Lesion Wound Location: Left, Lateral  Ankle Wound Status: Open Wounding Event: Gradually Appeared Comorbid History: Anemia, Sickle Cell Disease, Neuropathy Date Acquired: 12/25/2020 Weeks Of Treatment: 5 Clustered Wound: No Photos Wound Measurements Length: (cm) 0.8 Width: (cm) 0.6 Depth: (cm) 0.1 Area: (cm) 0.377 Volume: (cm) 0.038 % Reduction in Area: 85.2% % Reduction in Volume: 85.1% Epithelialization: Medium (34-66%) Tunneling: No Wound Description Classification: Full Thickness Without Exposed Support Structures Wound Margin: Distinct, outline attached Exudate Amount: Medium Exudate Type: Serosanguineous Exudate Color: red, brown Foul Odor After Cleansing: No Slough/Fibrino No Wound Bed Granulation Amount: Large (67-100%) Exposed Structure Granulation Quality: Red, Hyper-granulation Fascia Exposed: No Necrotic Amount: None Present (0%) Fat Layer (Subcutaneous Tissue) Exposed: Yes Tendon Exposed: No Muscle Exposed: No Joint Exposed: No Bone Exposed: No Treatment Notes Wound #20 (Ankle) Wound Laterality: Left, Lateral Cleanser Soap and Water Discharge Instruction: May shower and wash wound with dial antibacterial soap and water prior to dressing change. Wound Cleanser Discharge Instruction: Cleanse the wound with wound cleanser prior to applying a clean dressing using gauze sponges, not tissue or cotton balls. Peri-Wound Care Topical Primary Dressing Hydrofera Blue Ready Foam, 2.5 x2.5 in Discharge Instruction: Apply to wound bed as instructed Santyl Ointment Discharge Instruction: Apply nickel thick amount to wound bed as instructed Secondary Dressing ABD Pad, 5x9 Discharge Instruction: Apply over primary dressing as directed. Secured With Compression Wrap Kerlix Roll 4.5x3.1 (in/yd) Discharge Instruction: Apply Kerlix and Coban compression as directed. Coban Self-Adherent Wrap 4x5 (in/yd) Discharge Instruction: Apply over Kerlix as directed. Compression Stockings Add-Ons Electronic  Signature(s) Signed: 05/22/2021 3:19:23 PM By: Lorrin Jackson Entered By: Lorrin Jackson on 05/22/2021 11:12:05 -------------------------------------------------------------------------------- Vitals Details Patient Name: Date of Service: [name], [name] 05/22/2021 10:30 A M Medical Record Number: [id number] Patient Account Number: [id number] Date of Birth/Sex: Treating RN: 07-30-1970 (51 y.o. Sue [facility] [place] Mikami: [name] Other Clinician: Referring Mackena Plummer: Treating Keng Jewel/Extender: Farrel Demark Weeks in Treatment: 5 Vital Signs Time Taken: 10:46 Temperature (F): 98.2 Height (in): 67 Pulse (bpm): 87 Respiratory Rate (breaths/min): 16 Blood Pressure (mmHg): 131/68 Reference Range: 80 - 120 mg / dl Electronic Signature(s) Signed: 05/22/2021 3:19:23 PM By: Lorrin Jackson Entered By: Lorrin Jackson on 05/22/2021 10:47:24

## 2021-05-22 NOTE — Progress Notes (Signed)
[name], [name] ([id number]) Visit Report for 05/22/2021 HPI Details Patient Name: Date of Service: [name] [name] 05/22/2021 10:30 A M Medical Record Number: [id number] Patient Account Number: [id number] Date of Birth/Sex: Treating RN: Sep 07, 1970 (51 y.o. [name] Primary Care Provider: Cammie [name] Other Clinician: Referring Provider: Treating Provider/Extender: [name] in Treatment: 5 History of Present Illness Location: medial and lateral ankle region on the right and left medial malleolus Quality: Patient reports experiencing a shooting pain to affected area(s). Severity: Patient states wound(s) are getting worse. Duration: right lower extremity bimalleolar ulcers have been present for approximately 2 years; the rright meedial malleolus ulcer has been there proximally 6 months Timing: Pain in wound is constant (hurts all the time) Context: The wound would happen gradually ssociated Signs and Symptoms: Patient reports having increase discharge. 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 Leland [name] for a surgical debridement as I was unable to do anything in the office due to excruciating pain. At that stage she was referred from the plastic surgery service to dermatology who treated her for a skin infection with doxycycline and then Levaquin and a local antibiotic ointment. I understand the patient has since developed ulceration on the left ankle both medial and lateral and was now referred back to the wound [name] as dermatology has finished the management. 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 She has been visiting the wound [name] [name] and was seen by Dr. Dellia [name] and Dr. Leland [name]. after prolonged conservator therapy between July 2016 and January 2017. She had been seen by the plastic surgeon and taken to the OR for debridement and application of [name]. She had 3 applications of [name] and was then treated with collagen. Prior to that she had a history of similar problems in 2014 and was treated conservatively. Had a reflux study done for the right lower extremity in August 2016 without reflux or DVT . Past medical history significant for [name] cell disease, anemia, leg ulcers, cholelithiasis,and has never been a [name]. Once the patient was discharged on the wound [name] she says within 2 or 3 [name] the problems recurred and she has been treating it conservatively. since I saw her 3 [name] ago at [name] she has been unable to get her dressing material but has completed a course of doxycycline. 6/7/ 2017 -- lower extremity venous duplex reflux evaluation was done No evidence of SVT or DVT in the RLL. No venous incompetence in the RLL. No further vascular workup is indicated at this time. She was seen by [name], on 10/04/2015. She agreed with the plan of taking her to the OR for debridement and application of [name] and would also take biopsies to rule out pyoderma gangrenosum. Follow-up note dated May 31 received and she was status post application of [name] to multiple ulcers around the right ankle. Pathology did not show evidence of malignancy or pyoderma gangrenosum. She would continue to see as in the wound clinic for further care and see Dr. Leland [name] as needed. The patient brought the biopsy report and it was consistent with stasis ulcer no evidence of malignancy and  the comment was that there was some adjacent neovascularization, fibrosis and patchy perivascular chronic inflammation. 11/15/2015 -- today we applied her first application  of [name] 11/30/15; [name] #2 12/13/2015 -- she is having a lot of pain locally and is here for possible application of a [name] today. 01/16/2016 -- the patient has significant pain and has noticed despite in spite of all local care and oral pain medication. It is impossible to debride her in the office. 02/06/2016 -- I do not see any notes from [name]( the patient has not made a call to the office know as she heard from them) and the only visit to recently was with her PCP [name] -- I saw her on 01/16/2016 and prescribed 90 tablets of oxycodone 10 mg and did lab work and screening for HIV. the HIV was negative and hemoglobin was 6.3 with a WBC count of 14.9 and hematocrit of 17.8 with platelets of 561. reticulocyte count was 15.5% READMISSION: 07/10/2016- The patient is here for readmission for bilateral lower extremity ulcers in the presence of [name] cell. The bimalleolar ulcers to the right lower extremity have been present for approximately 2 years, the left medial malleolus ulcer has been present approximately 6 months. She has followed with [name] in the past and has had a total of 3 applications of [name] (01/2015, 09/2015, 06/17/16). She has also followed with [name] here in the clinic and has received 2 applications of [name] (11/10/15, 11/30/15). The patient does experience chronic, and is not amenable to debridement. She had a [name] cell crisis in December 2017, prior to that has been several years. She is not currently on any antibiotic therapy and has not been treated with any recently. 07/17/2016 -- was seen by [name] of plastic surgery who saw her 2 [name] postop application of [name] #3. She had removed her dressing and asked her to apply silver alginate on alternate days and follow-up back with the wound [name]. Future debridements and application of skin substitute would have to be done in the hospital due to her high risk for  anesthesia. READMISSION 04/17/2021 Patient is now a 51 year old woman that we have had in this clinic for a prolonged period of time and 2016-2017 and then again for 2 visits in February 2018. At that point she had wounds on the right lower leg predominantly medial. She had also been seen by plastic surgery Dr. Leland [name] who I believe took her to the OR for operative debridement and application of [name] in 2017. After she left our clinic she was followed for a very prolonged period of time in the wound care [name] in [facility] who then referred her ultimately to [facility] where she was seen by Dr. Vernona Rieger. Again taken her to the OR for skin grafting which apparently did not take. She had multiple other attempts at dressings although I have not really looked over all of these notes in great detail. She has not been seen in a wound care [name] in about a year. She states over the last year in addition to her right lower leg she has developed wounds on the left lower leg quite extensive. She is using Xeroform to all of these wounds without really any improvement. She also has Medicaid which does not cover wound products. The patient has had vascular work-ups in the past including most recently on 03/28/2021 showing biphasic waveforms on the right triphasic at the PTA and biphasic at the dorsalis pedis on the left. She  was unable to tolerate any degree of compression to do ABIs. Unfortunately TBI's were also not done. She had venous reflux studies done in 2017. This did not show any evidence of a DVT or SVT and no venous incompetence was noted in the right leg at the time this was the only side with the wound As noted I did not look all over her old records. She apparently had a course of HBO and [facility] although I am not sure what the indication would have been. In any case she developed seizures and terminated treatment earlier. She is generally much more disabled than when we last saw her in clinic.  She can no longer walk pretty much wheelchair-bound because predominantly of pain in the left hip. 04/24/2021; the patient tolerated the wraps we put on. We used Santyl and Hydrofera Blue under compression. I brought her back for a nurse visit for a change in dressing. With Medicaid we will have a hard time getting anything paid for and hence the need for compression. She arrives in clinic with all the wounds looking somewhat better in terms of surface 12/20; circumferential wound on the right from the lateral to the medial. She has open areas on the left medial and left lateral x2 on all of this with the same surface. This does not look completely healthy although she does have some epithelialization. She is not complaining of a lot of pain which is unusual for her [name] ulcers. I have not looked over her extensive records from [name]. She had recent arterial studies and has a history of venous reflux studies I will need to look these over although I do not believe she has significant arterial disease 2023 05/22/2021; patient's wound areas measure slightly smaller. Still a lot of drainage coming from the right we have been using Hydrofera Blue and Santyl with some improvement in the wound surfaces. 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 This was in November and showed biphasic and triphasic waveforms but she could not tolerate ABIs because of pressure and unfortunately TBI's were not done. She has not had recent venous reflux studies that I can see Electronic Signature(s) Signed: 05/22/2021 3:57:23 PM By: Linton Ham MD Entered By: Linton Ham on 05/22/2021 11:43:09 -------------------------------------------------------------------------------- Physical Exam Details Patient Name: Date of Service: [name], [name] 05/22/2021 10:30 A M Medical Record  Number: [id number] Patient Account Number: [id number] Date of Birth/Sex: Treating RN: 1970-10-28 (51 y.o. [name] Primary Care Provider: Cammie [name] Other Clinician: Referring Provider: Treating Provider/Extender: [name] in Treatment: 5 Constitutional Sitting or standing Blood Pressure is within target range for patient.. Pulse regular and within target range for patient.Marland Kitchen Respirations regular, non-labored and within target range.. Temperature is normal and within the target range for the patient.Marland Kitchen Appears in no distress. Cardiovascular Pedal pulses palpable and strong bilaterally.. There is not much edema no substantive evidence of chronic venous insufficiency. Notes Wound exam; circumferential wound on the right lower leg separated by a small bridge of skin near the Achilles. Surface of the wound does not look too bad but with the drainage I went ahead and did a small specimen for deep tissue culture Were looking a little better on the left medial and left lateral both of which appear to be smaller. Electronic Signature(s) Signed: 05/22/2021 3:57:23 PM By: [name],  Legrand Como MD Entered By: Linton Ham on 05/22/2021 11:46:14 -------------------------------------------------------------------------------- Physician Orders Details Patient Name: Date of Service: [name], [place] [name] 05/22/2021 10:30 A M Medical Record Number: [id number] Patient Account Number: [id number] Date of Birth/Sex: Treating RN: [birth year] (51 y.o. Sue Lush Primary Care Provider: Other Clinician: Cammie [name] Referring Provider: Treating Provider/Extender: Bernette Redbird in Treatment: 5 Verbal / Phone Orders: No Diagnosis Coding ICD-10 Coding Code Description L97.818 Non-pressure chronic ulcer of other part of right lower leg with other specified severity L97.828 Non-pressure chronic ulcer of other part of left lower leg with other  specified severity D57.1 [name]-cell disease without crisis Follow-up Appointments ppointment in 1 week. - Dr. Dellia [name] Return A Bathing/ Shower/ Hygiene May shower with protection but do not get wound dressing(s) wet. - Can get cast protector bags at [facility] or [facility] Additional Orders / Instructions Follow Nutritious Diet Wound Treatment Wound #17 - Lower Leg Wound Laterality: Right, Circumferential Cleanser: Soap and Water 2 x Per Week/30 Days Discharge Instructions: May shower and wash wound with dial antibacterial soap and water prior to dressing change. Cleanser: Wound Cleanser 2 x Per Week/30 Days Discharge Instructions: Cleanse the wound with wound cleanser prior to applying a clean dressing using gauze sponges, not tissue or cotton balls. Prim Dressing: Hydrofera Blue Ready Foam, 2.5 x2.5 in 2 x Per Week/30 Days ary Discharge Instructions: Apply to wound bed as instructed Prim Dressing: Santyl Ointment 2 x Per Week/30 Days ary Discharge Instructions: Apply nickel thick amount to wound bed as instructed Secondary Dressing: ABD Pad, 5x9 2 x Per Week/30 Days Discharge Instructions: Apply over primary dressing as directed. Compression Wrap: Kerlix Roll 4.5x3.1 (in/yd) 2 x Per Week/30 Days Discharge Instructions: Apply Kerlix and Coban compression as directed. Compression Wrap: Coban Self-Adherent Wrap 4x5 (in/yd) 2 x Per Week/30 Days Discharge Instructions: Apply over Kerlix as directed. Wound #18 - Ankle Wound Laterality: Left, Posterior Cleanser: Soap and Water 2 x Per Week/30 Days Discharge Instructions: May shower and wash wound with dial antibacterial soap and water prior to dressing change. Cleanser: Wound Cleanser 2 x Per Week/30 Days Discharge Instructions: Cleanse the wound with wound cleanser prior to applying a clean dressing using gauze sponges, not tissue or cotton balls. Prim Dressing: Hydrofera Blue Ready Foam, 2.5 x2.5 in 2 x Per Week/30 Days ary Discharge  Instructions: Apply to wound bed as instructed Prim Dressing: Santyl Ointment 2 x Per Week/30 Days ary Discharge Instructions: Apply nickel thick amount to wound bed as instructed Secondary Dressing: ABD Pad, 5x9 2 x Per Week/30 Days Discharge Instructions: Apply over primary dressing as directed. Compression Wrap: Kerlix Roll 4.5x3.1 (in/yd) 2 x Per Week/30 Days Discharge Instructions: Apply Kerlix and Coban compression as directed. Compression Wrap: Coban Self-Adherent Wrap 4x5 (in/yd) 2 x Per Week/30 Days Discharge Instructions: Apply over Kerlix as directed. Wound #19 - Ankle Wound Laterality: Left, Medial Cleanser: Soap and Water 2 x Per Week/30 Days Discharge Instructions: May shower and wash wound with dial antibacterial soap and water prior to dressing change. Cleanser: Wound Cleanser 2 x Per Week/30 Days Discharge Instructions: Cleanse the wound with wound cleanser prior to applying a clean dressing using gauze sponges, not tissue or cotton balls. Prim Dressing: Hydrofera Blue Ready Foam, 2.5 x2.5 in 2 x Per Week/30 Days ary Discharge Instructions: Apply to wound bed as instructed Prim Dressing: Santyl Ointment 2 x Per Week/30 Days ary Discharge Instructions: Apply nickel thick amount to wound bed as instructed Secondary Dressing: ABD Pad,  5x9 2 x Per Week/30 Days Discharge Instructions: Apply over primary dressing as directed. Compression Wrap: Kerlix Roll 4.5x3.1 (in/yd) 2 x Per Week/30 Days Discharge Instructions: Apply Kerlix and Coban compression as directed. Compression Wrap: Coban Self-Adherent Wrap 4x5 (in/yd) 2 x Per Week/30 Days Discharge Instructions: Apply over Kerlix as directed. Wound #20 - Ankle Wound Laterality: Left, Lateral Cleanser: Soap and Water 2 x Per Week/30 Days Discharge Instructions: May shower and wash wound with dial antibacterial soap and water prior to dressing change. Cleanser: Wound Cleanser 2 x Per Week/30 Days Discharge Instructions: Cleanse  the wound with wound cleanser prior to applying a clean dressing using gauze sponges, not tissue or cotton balls. Prim Dressing: Hydrofera Blue Ready Foam, 2.5 x2.5 in 2 x Per Week/30 Days ary Discharge Instructions: Apply to wound bed as instructed Prim Dressing: Santyl Ointment 2 x Per Week/30 Days ary Discharge Instructions: Apply nickel thick amount to wound bed as instructed Secondary Dressing: ABD Pad, 5x9 2 x Per Week/30 Days Discharge Instructions: Apply over primary dressing as directed. Compression Wrap: Kerlix Roll 4.5x3.1 (in/yd) 2 x Per Week/30 Days Discharge Instructions: Apply Kerlix and Coban compression as directed. Compression Wrap: Coban Self-Adherent Wrap 4x5 (in/yd) 2 x Per Week/30 Days Discharge Instructions: Apply over Kerlix as directed. Laboratory erobe culture (MICRO) - Right Leg - (ICD10 L97.818 - Non-pressure chronic ulcer of other part of Bacteria identified in Unspecified specimen by A right lower leg with other specified severity) LOINC Code: 885-0 Convenience Name: Areobic culture-specimen not specified Electronic Signature(s) Signed: 05/22/2021 3:19:23 PM By: Lorrin Jackson Signed: 05/22/2021 3:57:23 PM By: Linton Ham MD Entered By: Lorrin Jackson on 05/22/2021 11:24:36 -------------------------------------------------------------------------------- Problem List Details Patient Name: Date of Service: [name], [name] 05/22/2021 10:30 A M Medical Record Number: [id number] Patient Account Number: [id number] Date of Birth/Sex: Treating RN: 05-11-1971 (51 y.o. Sue Lush Primary Care Provider: Cammie [name] Other Clinician: Referring Provider: Treating Provider/Extender: [name] in Treatment: 5 Active Problems ICD-10 Encounter Code Description Active Date MDM Diagnosis L97.818 Non-pressure chronic ulcer of other part of right lower leg with other specified 04/17/2021 No Yes severity L97.828 Non-pressure  chronic ulcer of other part of left lower leg with other specified 04/17/2021 No Yes severity D57.1 [name]-cell disease without crisis 04/17/2021 No Yes Inactive Problems Resolved Problems Electronic Signature(s) Signed: 05/22/2021 3:57:23 PM By: Linton Ham MD Entered By: Linton Ham on 05/22/2021 11:37:35 -------------------------------------------------------------------------------- Progress Note Details Patient Name: Date of Service: [name], [name] 05/22/2021 10:30 A M Medical Record Number: [id number] Patient Account Number: [id number] Date of Birth/Sex: Treating RN: [birth year] (51 y.o. [name] Primary Care Provider: Cammie [name] Other Clinician: Referring Provider: Treating Provider/Extender: [name] in Treatment: 5 Subjective History of Present Illness (HPI) The following HPI elements were documented for the patient's wound: Location: medial and lateral ankle region on the right and left medial malleolus Quality: Patient reports experiencing a shooting pain to affected area(s). Severity: Patient states wound(s) are getting worse. Duration: right lower extremity bimalleolar ulcers have been present for approximately 2 years; the rright meedial malleolus ulcer has been there proximally 6 months Timing: Pain in wound is constant (hurts all the time) Context: The wound would happen gradually Associated Signs and Symptoms: Patient reports having increase discharge. 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 Leland [name] for a  surgical debridement as I was unable to do anything in the office due to excruciating pain. At that stage she was referred from the plastic surgery service to dermatology who treated her for a skin infection with doxycycline and then Levaquin and a local antibiotic ointment. I understand the  patient has since developed ulceration on the left ankle both medial and lateral and was now referred back to the wound [name] as dermatology has finished the management. 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 She has been visiting the wound [name] and was seen by Dr. Dellia [name] and Dr. Leland [name]. after prolonged conservator therapy between July 2016 and January 2017. She had been seen by the plastic surgeon and taken to the OR for debridement and application of [name]. She had 3 applications of [name] and was then treated with collagen. Prior to that she had a history of similar problems in 2014 and was treated conservatively. Had a reflux study done for the right lower extremity in August 2016 without reflux or DVT . Past medical history significant for [name] cell disease, anemia, leg ulcers, cholelithiasis,and has never been a [name]. Once the patient was discharged on the wound [name] she says within 2 or 3 [name] the problems recurred and she has been treating it conservatively. since I saw her 3 [name] ago at [name] she has been unable to get her dressing material but has completed a course of doxycycline. 6/7/ 2017 -- lower extremity venous duplex reflux evaluation was done oo No evidence of SVT or DVT in the RLL. No venous incompetence in the RLL. No further vascular workup is indicated at this time. She was seen by [name], on 10/04/2015. She agreed with the plan of taking her to the OR for debridement and application of [name] and would also take biopsies to rule out pyoderma gangrenosum. Follow-up note dated May 31 received and she was status post application of [name] to multiple ulcers around the right ankle. Pathology did not  show evidence of malignancy or pyoderma gangrenosum. She would continue to see as in the wound clinic for further care and see Dr. Leland [name] as needed. The patient brought the biopsy report and it was consistent with stasis ulcer no evidence of malignancy and the comment was that there was some adjacent neovascularization, fibrosis and patchy perivascular chronic inflammation. 11/15/2015 -- today we applied her first application of [name] 11/30/15; [name] #2 12/13/2015 -- she is having a lot of pain locally and is here for possible application of a [name] today. 01/16/2016 -- the patient has significant pain and has noticed despite in spite of all local care and oral pain medication. It is impossible to debride her in the office. 02/06/2016 -- I do not see any notes from [name]( the patient has not made a call to the office know as she heard from them) and the only visit to recently was with her PCP [name] -- I saw her on 01/16/2016 and prescribed 90 tablets of oxycodone 10 mg and did lab work and screening for HIV. the HIV was negative and hemoglobin was 6.3 with a WBC count of 14.9 and hematocrit of 17.8 with platelets of 561. reticulocyte count was 15.5% READMISSION: 07/10/2016- The patient is here for readmission for bilateral lower extremity  ulcers in the presence of [name] cell. The bimalleolar ulcers to the right lower extremity have been present for approximately 2 years, the left medial malleolus ulcer has been present approximately 6 months. She has followed with [name] in the past and has had a total of 3 applications of [name] (01/2015, 09/2015, 06/17/16). She has also followed with [name] here in the clinic and has received 2 applications of [name] (11/10/15, 11/30/15). The patient does experience chronic, and is not amenable to debridement. She had a [name] cell crisis in December 2017, prior to that has been several years. She is not currently on  any antibiotic therapy and has not been treated with any recently. 07/17/2016 -- was seen by [name] of plastic surgery who saw her 2 [name] postop application of [name] #3. She had removed her dressing and asked her to apply silver alginate on alternate days and follow-up back with the wound [name]. Future debridements and application of skin substitute would have to be done in the hospital due to her high risk for anesthesia. READMISSION 04/17/2021 Patient is now a 51 year old woman that we have had in this clinic for a prolonged period of time and 2016-2017 and then again for 2 visits in February 2018. At that point she had wounds on the right lower leg predominantly medial. She had also been seen by plastic surgery Dr. Leland [name] who I believe took her to the OR for operative debridement and application of [name] in 2017. After she left our clinic she was followed for a very prolonged period of time in the wound care [name] in [facility] who then referred her ultimately to [facility] where she was seen by Dr. Vernona Rieger. Again taken her to the OR for skin grafting which apparently did not take. She had multiple other attempts at dressings although I have not really looked over all of these notes in great detail. She has not been seen in a wound care [name] in about a year. She states over the last year in addition to her right lower leg she has developed wounds on the left lower leg quite extensive. She is using Xeroform to all of these wounds without really any improvement. She also has Medicaid which does not cover wound products. The patient has had vascular work-ups in the past including most recently on 03/28/2021 showing biphasic waveforms on the right triphasic at the PTA and biphasic at the dorsalis pedis on the left. She was unable to tolerate any degree of compression to do ABIs. Unfortunately TBI's were also not done. She had venous reflux studies done in 2017. This did not show any  evidence of a DVT or SVT and no venous incompetence was noted in the right leg at the time this was the only side with the wound As noted I did not look all over her old records. She apparently had a course of HBO and [facility] although I am not sure what the indication would have been. In any case she developed seizures and terminated treatment earlier. She is generally much more disabled than when we last saw her in clinic. She can no longer walk pretty much wheelchair-bound because predominantly of pain in the left hip. 04/24/2021; the patient tolerated the wraps we put on. We used Santyl and Hydrofera Blue under compression. I brought her back for a nurse visit for a change in dressing. With Medicaid we will have a hard time getting anything paid for and hence the need for compression. She arrives  in clinic with all the wounds looking somewhat better in terms of surface 12/20; circumferential wound on the right from the lateral to the medial. She has open areas on the left medial and left lateral x2 on all of this with the same surface. This does not look completely healthy although she does have some epithelialization. She is not complaining of a lot of pain which is unusual for her [name] ulcers. I have not looked over her extensive records from [facility]. She had recent arterial studies and has a history of venous reflux studies I will need to look these over although I do not believe she has significant arterial disease 2023 05/22/2021; patient's wound areas measure slightly smaller. Still a lot of drainage coming from the right we have been using Hydrofera Blue and Santyl with some improvement in the wound surfaces. 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 This was in November and showed biphasic and triphasic waveforms but she could not tolerate ABIs because of  pressure and unfortunately TBI's were not done. She has not had recent venous reflux studies that I can see Objective Constitutional Sitting or standing Blood Pressure is within target range for patient.. Pulse regular and within target range for patient.Marland Kitchen Respirations regular, non-labored and within target range.. Temperature is normal and within the target range for the patient.Marland Kitchen Appears in no distress. Vitals Time Taken: 10:46 AM, Height: 67 in, Temperature: 98.2 F, Pulse: 87 bpm, Respiratory Rate: 16 breaths/min, Blood Pressure: 131/68 mmHg. Cardiovascular Pedal pulses palpable and strong bilaterally.. There is not much edema no substantive evidence of chronic venous insufficiency. General Notes: Wound exam; circumferential wound on the right lower leg separated by a small bridge of skin near the Achilles. Surface of the wound does not look too bad but with the drainage I went ahead and did a small specimen for deep tissue culture oo Were looking a little better on the left medial and left lateral both of which appear to be smaller. Integumentary (Hair, Skin) Wound #17 status is Open. Original cause of wound was Gradually Appeared. The date acquired was: 10/05/2012. The wound has been in treatment 5 [name]. The wound is located on the Right,Circumferential Lower Leg. The wound measures 10cm length x 16.5cm width x 0.2cm depth; 129.591cm^2 area and 25.918cm^3 volume. There is Fat Layer (Subcutaneous Tissue) exposed. There is no tunneling or undermining noted. There is a large amount of purulent drainage noted. The wound margin is distinct with the outline attached to the wound base. There is large (67-100%) pink granulation within the wound bed. There is a small (1-33%) amount of necrotic tissue within the wound bed including Adherent Slough. Wound #18 status is Open. Original cause of wound was Gradually Appeared. The date acquired was: 12/25/2020. The wound has been in treatment 5 [name].  The wound is located on the Left,Posterior Ankle. The wound measures 4cm length x 1.9cm width x 0.1cm depth; 5.969cm^2 area and 0.597cm^3 volume. There is Fat Layer (Subcutaneous Tissue) exposed. There is no tunneling or undermining noted. There is a medium amount of serosanguineous drainage noted. The wound margin is distinct with the outline attached to the wound base. There is large (67-100%) pink, hyper - granulation within the wound bed. There is a small (1-33%) amount of necrotic tissue within the wound bed including Adherent Slough. Wound #19 status is Open. Original  cause of wound was Gradually Appeared. The date acquired was: 12/25/2020. The wound has been in treatment 5 [name]. The wound is located on the Left,Medial Ankle. The wound measures 4cm length x 3.5cm width x 0.1cm depth; 10.996cm^2 area and 1.1cm^3 volume. There is Fat Layer (Subcutaneous Tissue) exposed. There is no tunneling or undermining noted. There is a medium amount of serosanguineous drainage noted. The wound margin is distinct with the outline attached to the wound base. There is large (67-100%) pink, hyper - granulation within the wound bed. There is a small (1-33%) amount of necrotic tissue within the wound bed including Adherent Slough. Wound #20 status is Open. Original cause of wound was Gradually Appeared. The date acquired was: 12/25/2020. The wound has been in treatment 5 [name]. The wound is located on the Left,Lateral Ankle. The wound measures 0.8cm length x 0.6cm width x 0.1cm depth; 0.377cm^2 area and 0.038cm^3 volume. There is Fat Layer (Subcutaneous Tissue) exposed. There is no tunneling noted. There is a medium amount of serosanguineous drainage noted. The wound margin is distinct with the outline attached to the wound base. There is large (67-100%) red, hyper - granulation within the wound bed. There is no necrotic tissue within the wound bed. 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 - Dr. Dellia [name] Bathing/ Shower/ Hygiene: May shower with protection but do not get wound dressing(s) wet. - Can get cast protector bags at [facility] or [facility] Additional Orders / Instructions: Follow Nutritious Diet Laboratory ordered were: Areobic culture-specimen not specified - Right Leg WOUND #17: - Lower Leg Wound Laterality: Right, Circumferential Cleanser: Soap and Water 2 x Per Week/30 Days Discharge Instructions: May shower and wash wound with dial antibacterial soap and water prior to dressing change. Cleanser: Wound Cleanser 2 x Per Week/30 Days Discharge Instructions: Cleanse the wound with wound cleanser prior to applying a clean dressing using gauze sponges, not tissue or cotton balls. Prim Dressing: Hydrofera Blue Ready Foam, 2.5 x2.5 in 2 x Per Week/30 Days ary Discharge Instructions: Apply to wound bed as instructed Prim Dressing: Santyl Ointment 2 x Per Week/30 Days ary Discharge Instructions: Apply nickel thick amount to wound bed as instructed Secondary Dressing: ABD Pad, 5x9 2 x Per Week/30 Days Discharge Instructions: Apply over primary dressing as directed. Com pression Wrap: Kerlix Roll 4.5x3.1 (in/yd) 2 x Per Week/30 Days Discharge Instructions: Apply Kerlix and Coban compression as directed. Com pression Wrap: Coban Self-Adherent Wrap 4x5 (in/yd) 2 x Per Week/30 Days Discharge Instructions: Apply over Kerlix as directed. WOUND #18: - Ankle Wound Laterality: Left, Posterior Cleanser: Soap and Water 2 x Per Week/30 Days Discharge Instructions: May shower and wash wound with dial antibacterial soap and water prior to dressing change. Cleanser: Wound Cleanser 2 x Per Week/30 Days Discharge Instructions: Cleanse the wound with  wound cleanser prior to applying a clean dressing using gauze sponges, not tissue or cotton balls. Prim Dressing: Hydrofera Blue Ready Foam, 2.5 x2.5 in 2 x Per Week/30 Days ary Discharge Instructions: Apply to wound bed as instructed Prim Dressing: Santyl Ointment 2 x Per Week/30 Days ary Discharge Instructions: Apply nickel thick amount to wound bed as instructed Secondary Dressing: ABD Pad, 5x9 2 x Per Week/30 Days Discharge Instructions: Apply over primary dressing as directed. Com pression Wrap: Kerlix Roll  4.5x3.1 (in/yd) 2 x Per Week/30 Days Discharge Instructions: Apply Kerlix and Coban compression as directed. Com pression Wrap: Coban Self-Adherent Wrap 4x5 (in/yd) 2 x Per Week/30 Days Discharge Instructions: Apply over Kerlix as directed. WOUND #19: - Ankle Wound Laterality: Left, Medial Cleanser: Soap and Water 2 x Per Week/30 Days Discharge Instructions: May shower and wash wound with dial antibacterial soap and water prior to dressing change. Cleanser: Wound Cleanser 2 x Per Week/30 Days Discharge Instructions: Cleanse the wound with wound cleanser prior to applying a clean dressing using gauze sponges, not tissue or cotton balls. Prim Dressing: Hydrofera Blue Ready Foam, 2.5 x2.5 in 2 x Per Week/30 Days ary Discharge Instructions: Apply to wound bed as instructed Prim Dressing: Santyl Ointment 2 x Per Week/30 Days ary Discharge Instructions: Apply nickel thick amount to wound bed as instructed Secondary Dressing: ABD Pad, 5x9 2 x Per Week/30 Days Discharge Instructions: Apply over primary dressing as directed. Com pression Wrap: Kerlix Roll 4.5x3.1 (in/yd) 2 x Per Week/30 Days Discharge Instructions: Apply Kerlix and Coban compression as directed. Com pression Wrap: Coban Self-Adherent Wrap 4x5 (in/yd) 2 x Per Week/30 Days Discharge Instructions: Apply over Kerlix as directed. WOUND #20: - Ankle Wound Laterality: Left, Lateral Cleanser: Soap and Water 2 x Per Week/30  Days Discharge Instructions: May shower and wash wound with dial antibacterial soap and water prior to dressing change. Cleanser: Wound Cleanser 2 x Per Week/30 Days Discharge Instructions: Cleanse the wound with wound cleanser prior to applying a clean dressing using gauze sponges, not tissue or cotton balls. Prim Dressing: Hydrofera Blue Ready Foam, 2.5 x2.5 in 2 x Per Week/30 Days ary Discharge Instructions: Apply to wound bed as instructed Prim Dressing: Santyl Ointment 2 x Per Week/30 Days ary Discharge Instructions: Apply nickel thick amount to wound bed as instructed Secondary Dressing: ABD Pad, 5x9 2 x Per Week/30 Days Discharge Instructions: Apply over primary dressing as directed. Com pression Wrap: Kerlix Roll 4.5x3.1 (in/yd) 2 x Per Week/30 Days Discharge Instructions: Apply Kerlix and Coban compression as directed. Com pression Wrap: Coban Self-Adherent Wrap 4x5 (in/yd) 2 x Per Week/30 Days Discharge Instructions: Apply over Kerlix as directed. 1. Continued progress albeit slowly. Very large circumferential wound on the right has rims of epithelialization. On the left things look better although the wounds are originally smaller 2. I did a deep tissue culture of the right anterior lower leg because of drainage 3. Still using Santyl and silver alginate 4. Looking back through her records it does not appear that she has significant arterial disease. She has not had venous reflux studies since 2017 which might be worth repeating Electronic Signature(s) Signed: 05/22/2021 3:57:23 PM By: Linton Ham MD Entered By: Linton Ham on 05/22/2021 11:51:02 -------------------------------------------------------------------------------- SuperBill Details Patient Name: Date of Service: [place], [place] [name] 05/22/2021 Medical Record Number: [id number] Patient Account Number: [id number] Date of Birth/Sex: Treating RN: 31-Aug-1970 (51 y.o. 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 Facility Procedures CPT4 Code: [id number] Description: 99214 - WOUND CARE VISIT-LEV 4 EST PT Modifier: Quantity: 1 Physician Procedures : CPT4 Code Description Modifier [id number] - WC PHYS LEVEL 3 - EST PT ICD-10 Diagnosis Description  L97.818 Non-pressure chronic ulcer of other part of right lower leg with other specified severity L97.828 Non-pressure chronic ulcer of other part of  left lower leg with other specified severity 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

## 2021-05-27 LAB — AEROBIC/ANAEROBIC CULTURE W GRAM STAIN (SURGICAL/DEEP WOUND)

## 2021-05-28 ENCOUNTER — Telehealth: Payer: Self-pay

## 2021-05-28 ENCOUNTER — Other Ambulatory Visit: Payer: Self-pay | Admitting: Family Medicine

## 2021-05-28 DIAGNOSIS — G894 Chronic pain syndrome: Secondary | ICD-10-CM

## 2021-05-28 MED ORDER — OXYCODONE HCL 10 MG PO TABS: 10.0000 mg | ORAL_TABLET | ORAL | 0 refills | Status: DC

## 2021-05-28 NOTE — Progress Notes (Signed)
Reviewed PDMP substance reporting system prior to prescribing opiate medications. No inconsistencies noted.  Meds ordered this encounter  Medications   Oxycodone HCl 10 MG TABS    Sig: Take 1 tablet (10 mg total) by mouth every 4 (four) hours while awake.    Dispense:  90 tablet    Refill:  0    Order Specific Question:   Supervising Provider    Answer:   JEGEDE, OLUGBEMIGA E [[id number]   Catilyn Boggus Moore Namiyah Grantham  APRN, MSN, FNP-C [facility] [address]  [place], [place] [address] [phone]  

## 2021-05-28 NOTE — Telephone Encounter (Signed)
Oxycodone  °

## 2021-05-29 ENCOUNTER — Encounter (HOSPITAL_BASED_OUTPATIENT_CLINIC_OR_DEPARTMENT_OTHER): Payer: Medicaid Other | Admitting: Internal Medicine

## 2021-05-29 ENCOUNTER — Other Ambulatory Visit: Payer: Self-pay

## 2021-05-29 DIAGNOSIS — L97818 Non-pressure chronic ulcer of other part of right lower leg with other specified severity: Secondary | ICD-10-CM | POA: Diagnosis not present

## 2021-05-29 NOTE — Progress Notes (Signed)
[name], [name] ([id number]) Visit Report for 05/29/2021 HPI Details Patient Name: Date of Service: [name] [name] 05/29/2021 10:30 A M Medical Record Number: [id number] Patient Account Number: [id number] Date of Birth/Sex: Treating RN: 1971/04/06 (51 y.o. [name] Primary Care Provider: [name] Other Clinician: Referring Provider: Treating Provider/Extender: Farrel Demark Weeks in Treatment: 6 History of Present Illness Location: medial and lateral ankle region on the right and left medial malleolus Quality: Patient reports experiencing a shooting pain to affected area(s). Severity: Patient states wound(s) are getting worse. Duration: right lower extremity bimalleolar ulcers have been present for approximately 2 years; the rright meedial malleolus ulcer has been there proximally 6 months Timing: Pain in wound is constant (hurts all the time) Context: The wound would happen gradually ssociated Signs and Symptoms: Patient reports having increase discharge. A HPI Description: 51 year old patient with a history of sickle cell anemia who was last seen by me with ulceration of the right lower extremity above the ankle and was referred to Dr. Leland Johns for a surgical debridement as I was unable to do anything in the office due to excruciating pain. At that stage she was referred from the plastic surgery service to dermatology who treated her for a skin infection with doxycycline and then Levaquin and a local antibiotic ointment. I understand the patient has since developed ulceration on the left ankle both medial and lateral and was now referred back to the wound center as dermatology has finished the management. I do not have any notes from the dermatology department Old notes: 51 year old patient with a history of sickle cell anemia, pain bilateral lower extremities, right lower extremity ulcer and has a history of receiving a skin graft( Theraskin) several  months ago. She has been visiting the wound center [facility] and was seen by Dr. Dellia Nims and Dr. Leland Johns. after prolonged conservator therapy between July 2016 and January 2017. She had been seen by the plastic surgeon and taken to the OR for debridement and application of Theraskin. She had 3 applications of Theraskin and was then treated with collagen. Prior to that she had a history of similar problems in 2014 and was treated conservatively. Had a reflux study done for the right lower extremity in August 2016 without reflux or DVT . Past medical history significant for sickle cell disease, anemia, leg ulcers, cholelithiasis,and has never been a smoker. Once the patient was discharged on the wound center she says within 2 or 3 weeks the problems recurred and she has been treating it conservatively. since I saw her 3 weeks ago at [facility] she has been unable to get her dressing material but has completed a course of doxycycline. 6/7/ 2017 -- lower extremity venous duplex reflux evaluation was done No evidence of SVT or DVT in the RLL. No venous incompetence in the RLL. No further vascular workup is indicated at this time. She was seen by Dr. [name], on 10/04/2015. She agreed with the plan of taking her to the OR for debridement and application of theraskin and would also take biopsies to rule out pyoderma gangrenosum. Follow-up note dated May 31 received and she was status post application of Theraskin to multiple ulcers around the right ankle. Pathology did not show evidence of malignancy or pyoderma gangrenosum. She would continue to see as in the wound clinic for further care and see Dr. Leland Johns as needed. The patient brought the biopsy report and it was consistent with stasis ulcer no evidence of malignancy and  the comment was that there was some adjacent neovascularization, fibrosis and patchy perivascular chronic inflammation. 11/15/2015 -- today we applied her first application  of Theraskin 11/30/15; TheraSkin #2 12/13/2015 -- she is having a lot of pain locally and is here for possible application of a theraskin today. 01/16/2016 -- the patient has significant pain and has noticed despite in spite of all local care and oral pain medication. It is impossible to debride her in the office. 02/06/2016 -- I do not see any notes from Dr. Iran Planas( the patient has not made a call to the office know as she heard from them) and the only visit to recently was with her PCP Dr. Danella Penton -- I saw her on 01/16/2016 and prescribed 90 tablets of oxycodone 10 mg and did lab work and screening for HIV. the HIV was negative and hemoglobin was 6.3 with a WBC count of 14.9 and hematocrit of 17.8 with platelets of 561. 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 The bimalleolar ulcers to the right lower extremity have been present for approximately 2 years, the left medial malleolus ulcer has been present approximately 6 months. She has followed with Dr.Thimmappa in the past and has had a total of 3 applications of Theraskin (01/2015, 09/2015, 06/17/16). She has also followed with Dr. Con Memos here in the clinic and has received 2 applications of TheraSkin (11/10/15, 11/30/15). The patient does experience chronic, and is not amenable to debridement. 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 She is not currently on any antibiotic therapy and has not been treated with any recently. 07/17/2016 -- was seen by Dr. Iran Planas of plastic surgery who saw her 2 weeks postop application of Theraskin #3. She had removed her dressing and asked her to apply silver alginate on alternate days and follow-up back with the wound center. Future debridements and application of skin substitute would have to be done in the hospital due to her high risk for  anesthesia. READMISSION 04/17/2021 Patient is now a 51 year old woman that we have had in this clinic for a prolonged period of time and 2016-2017 and then again for 2 visits in February 2018. At that point she had wounds on the right lower leg predominantly medial. She had also been seen by plastic surgery Dr. Leland Johns who I believe took her to the OR for operative debridement and application of TheraSkin in 2017. After she left our clinic she was followed for a very prolonged period of time in the wound care center in [facility] who then referred her ultimately to [facility] where she was seen by Dr. Vernona Rieger. Again taken her to the OR for skin grafting which apparently did not take. She had multiple other attempts at dressings although I have not really looked over all of these notes in great detail. She has not been seen in a wound care center in about a year. She states over the last year in addition to her right lower leg she has developed wounds on the left lower leg quite extensive. She is using Xeroform to all of these wounds without really any improvement. She also has Medicaid which does not cover wound products. The patient has had vascular work-ups in the past including most recently on 03/28/2021 showing biphasic waveforms on the right triphasic at the PTA and biphasic at the dorsalis pedis on the left. She  was unable to tolerate any degree of compression to do ABIs. Unfortunately TBI's were also not done. She had venous reflux studies done in 2017. This did not show any evidence of a DVT or SVT and no venous incompetence was noted in the right leg at the time this was the only side with the wound As noted I did not look all over her old records. She apparently had a course of HBO and [facility] although I am not sure what the indication would have been. In any case she developed seizures and terminated treatment earlier. She is generally much more disabled than when we last saw her in clinic.  She can no longer walk pretty much wheelchair-bound because predominantly of pain in the left hip. 04/24/2021; the patient tolerated the wraps we put on. We used Santyl and Hydrofera Blue under compression. I brought her back for a nurse visit for a change in dressing. With Medicaid we will have a hard time getting anything paid for and hence the need for compression. She arrives in clinic with all the wounds looking somewhat better in terms of surface 12/20; circumferential wound on the right from the lateral to the medial. She has open areas on the left medial and left lateral x2 on all of this with the same surface. This does not look completely healthy although she does have some epithelialization. She is not complaining of a lot of pain which is unusual for her sickle ulcers. I have not looked over her extensive records from [facility]. She had recent arterial studies and has a history of venous reflux studies I will need to look these over although I do not believe she has significant arterial disease 2023 05/22/2021; patient's wound areas measure slightly smaller. Still a lot of drainage coming from the right we have been using Hydrofera Blue and Santyl with some improvement in the wound surfaces. She tells me she will be getting transfused later in the week for her underlying sickle cell anemia I have looked over her recent arterial studies which were done in the fall. This was in November and showed biphasic and triphasic waveforms but she could not tolerate ABIs because of pressure and unfortunately TBI's were not done. She has not had recent venous reflux studies that I can see 1/10; not much change about the same surface area. This has a yellowish surface to it very gritty. We have been using Santyl and Hydrofera Blue for a prolonged period. Culture I did last week showed methicillin sensitive staph aureus "rare". Our intake nurse reports greenish drainage which may be the [facility]  itself Electronic Signature(s) Signed: 05/29/2021 4:28:08 PM By: Linton Ham MD Entered By: Linton Ham on 05/29/2021 11:55:49 -------------------------------------------------------------------------------- Physical Exam Details Patient Name: Date of Service: [name], [name] 05/29/2021 10:30 A M Medical Record Number: [id number] Patient Account Number: [id number] Date of Birth/Sex: Treating RN: Oct 10, 1970 (51 y.o. [name] Primary Care Provider: [name] Other Clinician: Referring Provider: Treating Provider/Extender: Farrel Demark Weeks in Treatment: 6 Constitutional Sitting or standing Blood Pressure is within target range for patient.. Pulse regular and within target range for patient.Marland Kitchen Respirations regular, non-labored and within target range.. Temperature is normal and within the target range for the patient.Marland Kitchen Appears in no distress. Notes Wound exam; circumferential wound on the right lower leg. Very gritty adherent surface. Unfortunately she simply cannot tolerate debridements. The areas on the left medial and left lateral ankle looks somewhat better but still are open.  There is no evidence of infection Electronic Signature(s) Signed: 05/29/2021 4:28:08 PM By: Linton Ham MD Entered By: Linton Ham on 05/29/2021 11:56:43 -------------------------------------------------------------------------------- Physician Orders Details Patient Name: Date of Service: [name], [name] 05/29/2021 10:30 A M Medical Record Number: [id number] Patient Account Number: [id number] Date of Birth/Sex: Treating RN: [birth year] (51 y.o. Sue Lush Primary Care Provider: [name] Other Clinician: Referring Provider: Treating Provider/Extender: Bernette Redbird in Treatment: 6 Verbal / Phone Orders: No Diagnosis Coding ICD-10 Coding Code Description L97.818 Non-pressure chronic ulcer of other part of right lower  leg with other specified severity L97.828 Non-pressure chronic ulcer of other part of left lower leg with other specified severity 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 Follow-up Appointments ppointment in 1 week. - Dr. Dellia Nims Return A Bathing/ Shower/ Hygiene May shower with protection but do not get wound dressing(s) wet. - Can get cast protector bags at [facility] or [facility] Additional Orders / Instructions Follow Nutritious Diet Wound Treatment Wound #17 - Lower Leg Wound Laterality: Right, Circumferential Cleanser: Soap and Water 2 x Per Week/30 Days Discharge Instructions: May shower and wash wound with dial antibacterial soap and water prior to dressing change. Cleanser: Wound Cleanser 2 x Per Week/30 Days Discharge Instructions: Cleanse the wound with wound cleanser prior to applying a clean dressing using gauze sponges, not tissue or cotton balls. Peri-Wound Care: Triamcinolone 15 (g) 2 x Per Week/30 Days Discharge Instructions: Use triamcinolone 15 (g) as directed Peri-Wound Care: Sween Lotion (Moisturizing lotion) 2 x Per Week/30 Days Discharge Instructions: Apply moisturizing lotion as directed Topical: Antibiotic Ointment 2 x Per Week/30 Days Prim Dressing: Hydrofera Blue Ready Foam, 2.5 x2.5 in 2 x Per Week/30 Days ary Discharge Instructions: Apply to wound bed as instructed Secondary Dressing: ABD Pad, 5x9 2 x Per Week/30 Days Discharge Instructions: Apply over primary dressing as directed. Compression Wrap: Kerlix Roll 4.5x3.1 (in/yd) 2 x Per Week/30 Days Discharge Instructions: Apply Kerlix and Coban compression as directed. Compression Wrap: Coban Self-Adherent Wrap 4x5 (in/yd) 2 x Per Week/30 Days Discharge Instructions: Apply over Kerlix as directed. Wound #18 - Ankle Wound Laterality: Left, Posterior Cleanser: Soap and Water 2 x Per Week/30 Days Discharge Instructions: May shower and wash wound with dial antibacterial soap and water prior to dressing  change. Cleanser: Wound Cleanser 2 x Per Week/30 Days Discharge Instructions: Cleanse the wound with wound cleanser prior to applying a clean dressing using gauze sponges, not tissue or cotton balls. Peri-Wound Care: Triamcinolone 15 (g) 2 x Per Week/30 Days Discharge Instructions: Use triamcinolone 15 (g) as directed Peri-Wound Care: Sween Lotion (Moisturizing lotion) 2 x Per Week/30 Days Discharge Instructions: Apply moisturizing lotion as directed Topical: Antibiotic Ointment 2 x Per Week/30 Days Prim Dressing: Hydrofera Blue Ready Foam, 2.5 x2.5 in 2 x Per Week/30 Days ary Discharge Instructions: Apply to wound bed as instructed Secondary Dressing: ABD Pad, 5x9 2 x Per Week/30 Days Discharge Instructions: Apply over primary dressing as directed. Compression Wrap: Kerlix Roll 4.5x3.1 (in/yd) 2 x Per Week/30 Days Discharge Instructions: Apply Kerlix and Coban compression as directed. Compression Wrap: Coban Self-Adherent Wrap 4x5 (in/yd) 2 x Per Week/30 Days Discharge Instructions: Apply over Kerlix as directed. Wound #19 - Ankle Wound Laterality: Left, Medial Cleanser: Soap and Water 2 x Per Week/30 Days Discharge Instructions: May shower and wash wound with dial antibacterial soap and water prior to dressing change. Cleanser: Wound Cleanser 2 x Per Week/30 Days Discharge Instructions: Cleanse the wound with wound cleanser prior to  applying a clean dressing using gauze sponges, not tissue or cotton balls. Peri-Wound Care: Triamcinolone 15 (g) 2 x Per Week/30 Days Discharge Instructions: Use triamcinolone 15 (g) as directed Peri-Wound Care: Sween Lotion (Moisturizing lotion) 2 x Per Week/30 Days Discharge Instructions: Apply moisturizing lotion as directed Topical: Antibiotic Ointment 2 x Per Week/30 Days Prim Dressing: Hydrofera Blue Ready Foam, 2.5 x2.5 in 2 x Per Week/30 Days ary Discharge Instructions: Apply to wound bed as instructed Secondary Dressing: ABD Pad, 5x9 2 x Per  Week/30 Days Discharge Instructions: Apply over primary dressing as directed. Compression Wrap: Kerlix Roll 4.5x3.1 (in/yd) 2 x Per Week/30 Days Discharge Instructions: Apply Kerlix and Coban compression as directed. Compression Wrap: Coban Self-Adherent Wrap 4x5 (in/yd) 2 x Per Week/30 Days Discharge Instructions: Apply over Kerlix as directed. Wound #20 - Ankle Wound Laterality: Left, Lateral Cleanser: Soap and Water 2 x Per Week/30 Days Discharge Instructions: May shower and wash wound with dial antibacterial soap and water prior to dressing change. Cleanser: Wound Cleanser 2 x Per Week/30 Days Discharge Instructions: Cleanse the wound with wound cleanser prior to applying a clean dressing using gauze sponges, not tissue or cotton balls. Peri-Wound Care: Triamcinolone 15 (g) 2 x Per Week/30 Days Discharge Instructions: Use triamcinolone 15 (g) as directed Peri-Wound Care: Sween Lotion (Moisturizing lotion) 2 x Per Week/30 Days Discharge Instructions: Apply moisturizing lotion as directed Topical: Antibiotic Ointment 2 x Per Week/30 Days Prim Dressing: Hydrofera Blue Ready Foam, 2.5 x2.5 in 2 x Per Week/30 Days ary Discharge Instructions: Apply to wound bed as instructed Secondary Dressing: ABD Pad, 5x9 2 x Per Week/30 Days Discharge Instructions: Apply over primary dressing as directed. Compression Wrap: Kerlix Roll 4.5x3.1 (in/yd) 2 x Per Week/30 Days Discharge Instructions: Apply Kerlix and Coban compression as directed. Compression Wrap: Coban Self-Adherent Wrap 4x5 (in/yd) 2 x Per Week/30 Days Discharge Instructions: Apply over Kerlix as directed. Services and Therapies Venous Studies Reflux Studies: Bilateral - Chronic Wounds to Bilateral Legs - (ICD10 L97.818 - Non-pressure chronic ulcer of other part of right lower leg with other specified severity) Electronic Signature(s) Signed: 05/29/2021 2:16:32 PM By: Lorrin Jackson Signed: 05/29/2021 4:28:08 PM By: Linton Ham  MD Entered By: Lorrin Jackson on 05/29/2021 14:16:30 Prescription 05/29/2021 -------------------------------------------------------------------------------- Kassie Mends MD Patient Name: Provider: 10-Nov-1970 [id number] Date of Birth: NPI#Jesse Sans Sex: DEA #: [phone] [id number] Phone #: License #: [place] Patient Address: [address] [address] , [place], [place] [address] [phone] Allergies No Known Allergies Provider's Orders Venous Studies Reflux Studies: Bilateral - ICD10: L97.818 - Chronic Wounds to Bilateral Legs Hand Signature: Date(s): Electronic Signature(s) Signed: 05/29/2021 4:28:08 PM By: Linton Ham MD Signed: 05/29/2021 4:43:10 PM By: Lorrin Jackson Entered By: Lorrin Jackson on 05/29/2021 14:16:32 -------------------------------------------------------------------------------- Problem List Details Patient Name: Date of Service: [name], [name] 05/29/2021 10:30 A M Medical Record Number: [id number] Patient Account Number: [id number] Date of Birth/Sex: Treating RN: [birth year] (51 y.o. Sue Lush Primary Care Provider: [name] Other Clinician: Referring Provider: Treating Provider/Extender: Farrel Demark Weeks in Treatment: 6 Active Problems ICD-10 Encounter Code Description Active Date MDM Diagnosis L97.818 Non-pressure chronic ulcer of other part of right lower leg with other specified 04/17/2021 No Yes severity L97.828 Non-pressure chronic ulcer of other part of left lower leg with other specified 04/17/2021 No Yes severity 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 04/17/2021 No Yes Inactive Problems Resolved Problems  Electronic Signature(s) Signed: 05/29/2021 4:28:08 PM By: Linton Ham MD Entered By: Linton Ham on 05/29/2021  11:54:28 -------------------------------------------------------------------------------- Progress Note Details Patient Name: Date of Service: [name], [name] 05/29/2021 10:30 A M Medical Record Number: [id number] Patient Account Number: [id number] Date of Birth/Sex: Treating RN: [birth year] (51 y.o. [name] Primary Care Provider: [name] Other Clinician: Referring Provider: Treating Provider/Extender: Farrel Demark Weeks in Treatment: 6 Subjective History of Present Illness (HPI) The following HPI elements were documented for the patient's wound: Location: medial and lateral ankle region on the right and left medial malleolus Quality: Patient reports experiencing a shooting pain to affected area(s). Severity: Patient states wound(s) are getting worse. Duration: right lower extremity bimalleolar ulcers have been present for approximately 2 years; the rright meedial malleolus ulcer has been there proximally 6 months Timing: Pain in wound is constant (hurts all the time) Context: The wound would happen gradually Associated Signs and Symptoms: Patient reports having increase discharge. 51 year old patient with a history of sickle cell anemia who was last seen by me with ulceration of the right lower extremity above the ankle and was referred to Dr. Leland Johns for a surgical debridement as I was unable to do anything in the office due to excruciating pain. At that stage she was referred from the plastic surgery service to dermatology who treated her for a skin infection with doxycycline and then Levaquin and a local antibiotic ointment. I understand the patient has since developed ulceration on the left ankle both medial and lateral and was now referred back to the wound center as dermatology has finished the management. I do not have any notes from the dermatology department Old notes: 51 year old patient with a history of sickle cell anemia, pain bilateral  lower extremities, right lower extremity ulcer and has a history of receiving a skin graft( Theraskin) several months ago. She has been visiting the wound center [facility] and was seen by Dr. Dellia Nims and Dr. Leland Johns. after prolonged conservator therapy between July 2016 and January 2017. She had been seen by the plastic surgeon and taken to the OR for debridement and application of Theraskin. She had 3 applications of Theraskin and was then treated with collagen. Prior to that she had a history of similar problems in 2014 and was treated conservatively. Had a reflux study done for the right lower extremity in August 2016 without reflux or DVT . Past medical history significant for sickle cell disease, anemia, leg ulcers, cholelithiasis,and has never been a smoker. Once the patient was discharged on the wound center she says within 2 or 3 weeks the problems recurred and she has been treating it conservatively. since I saw her 3 weeks ago at [facility] she has been unable to get her dressing material but has completed a course of doxycycline. 6/7/ 2017 -- lower extremity venous duplex reflux evaluation was done oo No evidence of SVT or DVT in the RLL. No venous incompetence in the RLL. No further vascular workup is indicated at this time. She was seen by Dr. [name], on 10/04/2015. She agreed with the plan of taking her to the OR for debridement and application of theraskin and would also take biopsies to rule out pyoderma gangrenosum. Follow-up note dated May 31 received and she was status post application of Theraskin to multiple ulcers around the right ankle. Pathology did not show evidence of malignancy or pyoderma gangrenosum. She would continue to see as in the wound clinic for further care and  see Dr. Leland Johns as needed. The patient brought the biopsy report and it was consistent with stasis ulcer no evidence of malignancy and the comment was that there was some  adjacent neovascularization, fibrosis and patchy perivascular chronic inflammation. 11/15/2015 -- today we applied her first application of Theraskin 11/30/15; TheraSkin #2 12/13/2015 -- she is having a lot of pain locally and is here for possible application of a theraskin today. 01/16/2016 -- the patient has significant pain and has noticed despite in spite of all local care and oral pain medication. It is impossible to debride her in the office. 02/06/2016 -- I do not see any notes from Dr. Iran Planas( the patient has not made a call to the office know as she heard from them) and the only visit to recently was with her PCP Dr. Danella Penton -- I saw her on 01/16/2016 and prescribed 90 tablets of oxycodone 10 mg and did lab work and screening for HIV. the HIV was negative and hemoglobin was 6.3 with a WBC count of 14.9 and hematocrit of 17.8 with platelets of 561. 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 The bimalleolar ulcers to the right lower extremity have been present for approximately 2 years, the left medial malleolus ulcer has been present approximately 6 months. She has followed with Dr.Thimmappa in the past and has had a total of 3 applications of Theraskin (01/2015, 09/2015, 06/17/16). She has also followed with Dr. Con Memos here in the clinic and has received 2 applications of TheraSkin (11/10/15, 11/30/15). The patient does experience chronic, and is not amenable to debridement. 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 She is not currently on any antibiotic therapy and has not been treated with any recently. 07/17/2016 -- was seen by Dr. Iran Planas of plastic surgery who saw her 2 weeks postop application of Theraskin #3. She had removed her dressing and asked her to apply silver alginate on alternate days and follow-up back with the  wound center. Future debridements and application of skin substitute would have to be done in the hospital due to her high risk for anesthesia. READMISSION 04/17/2021 Patient is now a 51 year old woman that we have had in this clinic for a prolonged period of time and 2016-2017 and then again for 2 visits in February 2018. At that point she had wounds on the right lower leg predominantly medial. She had also been seen by plastic surgery Dr. Leland Johns who I believe took her to the OR for operative debridement and application of TheraSkin in 2017. After she left our clinic she was followed for a very prolonged period of time in the wound care center in [facility] who then referred her ultimately to [facility] where she was seen by Dr. Vernona Rieger. Again taken her to the OR for skin grafting which apparently did not take. She had multiple other attempts at dressings although I have not really looked over all of these notes in great detail. She has not been seen in a wound care center in about a year. She states over the last year in addition to her right lower leg she has developed wounds on the left lower leg quite extensive. She is using Xeroform to all of these wounds without really any improvement. She also has Medicaid which does not cover wound products. The patient has had vascular work-ups in the past including most  recently on 03/28/2021 showing biphasic waveforms on the right triphasic at the PTA and biphasic at the dorsalis pedis on the left. She was unable to tolerate any degree of compression to do ABIs. Unfortunately TBI's were also not done. She had venous reflux studies done in 2017. This did not show any evidence of a DVT or SVT and no venous incompetence was noted in the right leg at the time this was the only side with the wound As noted I did not look all over her old records. She apparently had a course of HBO and [facility] although I am not sure what the indication would have been. In any case  she developed seizures and terminated treatment earlier. She is generally much more disabled than when we last saw her in clinic. She can no longer walk pretty much wheelchair-bound because predominantly of pain in the left hip. 04/24/2021; the patient tolerated the wraps we put on. We used Santyl and Hydrofera Blue under compression. I brought her back for a nurse visit for a change in dressing. With Medicaid we will have a hard time getting anything paid for and hence the need for compression. She arrives in clinic with all the wounds looking somewhat better in terms of surface 12/20; circumferential wound on the right from the lateral to the medial. She has open areas on the left medial and left lateral x2 on all of this with the same surface. This does not look completely healthy although she does have some epithelialization. She is not complaining of a lot of pain which is unusual for her sickle ulcers. I have not looked over her extensive records from [facility]. She had recent arterial studies and has a history of venous reflux studies I will need to look these over although I do not believe she has significant arterial disease 2023 05/22/2021; patient's wound areas measure slightly smaller. Still a lot of drainage coming from the right we have been using Hydrofera Blue and Santyl with some improvement in the wound surfaces. She tells me she will be getting transfused later in the week for her underlying sickle cell anemia I have looked over her recent arterial studies which were done in the fall. This was in November and showed biphasic and triphasic waveforms but she could not tolerate ABIs because of pressure and unfortunately TBI's were not done. She has not had recent venous reflux studies that I can see 1/10; not much change about the same surface area. This has a yellowish surface to it very gritty. We have been using Santyl and Hydrofera Blue for a prolonged period. Culture I did last week  showed methicillin sensitive staph aureus "rare". Our intake nurse reports greenish drainage which may be the Hydrofera Blue itself Objective Constitutional Sitting or standing Blood Pressure is within target range for patient.. Pulse regular and within target range for patient.Marland Kitchen Respirations regular, non-labored and within target range.. Temperature is normal and within the target range for the patient.Marland Kitchen Appears in no distress. Vitals Time Taken: 10:59 AM, Height: 67 in, Temperature: 98.4 F, Pulse: 77 bpm, Respiratory Rate: 16 breaths/min, Blood Pressure: 145/77 mmHg. General Notes: Wound exam; circumferential wound on the right lower leg. Very gritty adherent surface. Unfortunately she simply cannot tolerate debridements. The areas on the left medial and left lateral ankle looks somewhat better but still are open. There is no evidence of infection Integumentary (Hair, Skin) Wound #17 status is Open. Original cause of wound was Gradually Appeared. The date acquired was: 10/05/2012.  The wound has been in treatment 6 weeks. The wound is located on the Right,Circumferential Lower Leg. The wound measures 12cm length x 16.1cm width x 0.2cm depth; 151.739cm^2 area and 30.348cm^3 volume. There is Fat Layer (Subcutaneous Tissue) exposed. There is no tunneling or undermining noted. There is a large amount of purulent drainage noted. The wound margin is distinct with the outline attached to the wound base. There is medium (34-66%) pink granulation within the wound bed. There is a medium (34-66%) amount of necrotic tissue within the wound bed including Adherent Slough. Wound #18 status is Open. Original cause of wound was Gradually Appeared. The date acquired was: 12/25/2020. The wound has been in treatment 6 weeks. The wound is located on the Left,Posterior Ankle. The wound measures 3.5cm length x 1.6cm width x 0.1cm depth; 4.398cm^2 area and 0.44cm^3 volume. There is Fat Layer (Subcutaneous Tissue) exposed.  There is no tunneling or undermining noted. There is a medium amount of serosanguineous drainage noted. The wound margin is distinct with the outline attached to the wound base. There is large (67-100%) pink, hyper - granulation within the wound bed. There is a small (1-33%) amount of necrotic tissue within the wound bed including Adherent Slough. Wound #19 status is Open. Original cause of wound was Gradually Appeared. The date acquired was: 12/25/2020. The wound has been in treatment 6 weeks. The wound is located on the Left,Medial Ankle. The wound measures 4.6cm length x 3cm width x 0.1cm depth; 10.838cm^2 area and 1.084cm^3 volume. There is Fat Layer (Subcutaneous Tissue) exposed. There is no tunneling or undermining noted. There is a medium amount of serosanguineous drainage noted. The wound margin is distinct with the outline attached to the wound base. There is large (67-100%) pink, hyper - granulation within the wound bed. There is a small (1-33%) amount of necrotic tissue within the wound bed including Adherent Slough. Wound #20 status is Open. Original cause of wound was Gradually Appeared. The date acquired was: 12/25/2020. The wound has been in treatment 6 weeks. The wound is located on the Left,Lateral Ankle. The wound measures 0.5cm length x 0.5cm width x 0.1cm depth; 0.196cm^2 area and 0.02cm^3 volume. There is Fat Layer (Subcutaneous Tissue) exposed. There is no tunneling or undermining noted. There is a medium amount of serosanguineous drainage noted. The wound margin is distinct with the outline attached to the wound base. There is large (67-100%) red, hyper - granulation within the wound bed. There is no necrotic tissue within the wound bed. 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 - Dr. Dellia Nims Bathing/ Shower/ Hygiene: May shower with protection but do not get wound dressing(s) wet. - Can get cast protector bags at [facility] or [facility] Additional Orders / Instructions: Follow Nutritious Diet Services and Therapies ordered were: Venous Studies Reflux Studies: Bilateral - Chronic Wounds to Bilateral Legs WOUND #17: - Lower Leg Wound Laterality: Right, Circumferential Cleanser: Soap and Water 2 x Per Week/30 Days Discharge Instructions: May shower and wash wound with dial antibacterial soap and water prior to dressing change. Cleanser: Wound Cleanser 2 x Per Week/30 Days Discharge Instructions: Cleanse the wound with wound cleanser prior to applying a clean dressing using gauze sponges, not tissue or cotton balls. Topical: Antibiotic Ointment 2 x Per Week/30 Days Prim Dressing: Hydrofera Blue  Ready Foam, 2.5 x2.5 in 2 x Per Week/30 Days ary Discharge Instructions: Apply to wound bed as instructed Secondary Dressing: ABD Pad, 5x9 2 x Per Week/30 Days Discharge Instructions: Apply over primary dressing as directed. Com pression Wrap: Kerlix Roll 4.5x3.1 (in/yd) 2 x Per Week/30 Days Discharge Instructions: Apply Kerlix and Coban compression as directed. Com pression Wrap: Coban Self-Adherent Wrap 4x5 (in/yd) 2 x Per Week/30 Days Discharge Instructions: Apply over Kerlix as directed. WOUND #18: - Ankle Wound Laterality: Left, Posterior Cleanser: Soap and Water 2 x Per Week/30 Days Discharge Instructions: May shower and wash wound with dial antibacterial soap and water prior to dressing change. Cleanser: Wound Cleanser 2 x Per Week/30 Days Discharge Instructions: Cleanse the wound with wound cleanser prior to applying a clean dressing using gauze sponges, not tissue or cotton balls. Topical: Antibiotic Ointment 2 x Per Week/30 Days Prim Dressing: Hydrofera Blue Ready Foam, 2.5 x2.5 in 2 x Per Week/30  Days ary Discharge Instructions: Apply to wound bed as instructed Secondary Dressing: ABD Pad, 5x9 2 x Per Week/30 Days Discharge Instructions: Apply over primary dressing as directed. Com pression Wrap: Kerlix Roll 4.5x3.1 (in/yd) 2 x Per Week/30 Days Discharge Instructions: Apply Kerlix and Coban compression as directed. Com pression Wrap: Coban Self-Adherent Wrap 4x5 (in/yd) 2 x Per Week/30 Days Discharge Instructions: Apply over Kerlix as directed. WOUND #19: - Ankle Wound Laterality: Left, Medial Cleanser: Soap and Water 2 x Per Week/30 Days Discharge Instructions: May shower and wash wound with dial antibacterial soap and water prior to dressing change. Cleanser: Wound Cleanser 2 x Per Week/30 Days Discharge Instructions: Cleanse the wound with wound cleanser prior to applying a clean dressing using gauze sponges, not tissue or cotton balls. Topical: Antibiotic Ointment 2 x Per Week/30 Days Prim Dressing: Hydrofera Blue Ready Foam, 2.5 x2.5 in 2 x Per Week/30 Days ary Discharge Instructions: Apply to wound bed as instructed Secondary Dressing: ABD Pad, 5x9 2 x Per Week/30 Days Discharge Instructions: Apply over primary dressing as directed. Com pression Wrap: Kerlix Roll 4.5x3.1 (in/yd) 2 x Per Week/30 Days Discharge Instructions: Apply Kerlix and Coban compression as directed. Com pression Wrap: Coban Self-Adherent Wrap 4x5 (in/yd) 2 x Per Week/30 Days Discharge Instructions: Apply over Kerlix as directed. WOUND #20: - Ankle Wound Laterality: Left, Lateral Cleanser: Soap and Water 2 x Per Week/30 Days Discharge Instructions: May shower and wash wound with dial antibacterial soap and water prior to dressing change. Cleanser: Wound Cleanser 2 x Per Week/30 Days Discharge Instructions: Cleanse the wound with wound cleanser prior to applying a clean dressing using gauze sponges, not tissue or cotton balls. Topical: Antibiotic Ointment 2 x Per Week/30 Days Prim Dressing: Hydrofera  Blue Ready Foam, 2.5 x2.5 in 2 x Per Week/30 Days ary Discharge Instructions: Apply to wound bed as instructed Secondary Dressing: ABD Pad, 5x9 2 x Per Week/30 Days Discharge Instructions: Apply over primary dressing as directed. Com pression Wrap: Kerlix Roll 4.5x3.1 (in/yd) 2 x Per Week/30 Days Discharge Instructions: Apply Kerlix and Coban compression as directed. Com pression Wrap: Coban Self-Adherent Wrap 4x5 (in/yd) 2 x Per Week/30 Days Discharge Instructions: Apply over Kerlix as directed. 1. Although this all may be secondary to her sickle cell disease I have ordered venous reflux studies. 2. She is not felt to have significant arterial disease although her previous investigations did not include ABIs because of pain nor were TBI's done she had biphasic and triphasic waveforms however 3. In the past she underwent operative debridements  by plastic surgery we may need to rerefer her there as well Electronic Signature(s) Signed: 05/29/2021 4:28:08 PM By: Linton Ham MD Entered By: Linton Ham on 05/29/2021 11:58:30 -------------------------------------------------------------------------------- SuperBill Details Patient Name: Date of Service: [place], [name] 05/29/2021 Medical Record Number: [id number] Patient Account Number: [id number] Date of Birth/Sex: Treating RN: November 08, 1970 (51 y.o. Sue Lush Primary Care Provider: [name] Other Clinician: Referring Provider: Treating Provider/Extender: Farrel Demark Weeks in Treatment: 6 Diagnosis Coding ICD-10 Codes Code Description [phone] Non-pressure chronic ulcer of other part of right lower leg with other specified severity L97.828 Non-pressure chronic ulcer of other part of left lower leg with other specified severity 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 Facility Procedures CPT4 Code: [id number] Description: 99214 - WOUND CARE VISIT-LEV 4 EST PT Modifier: Quantity: 1 Physician  Procedures : CPT4 Code Description Modifier [id number] 99213 - WC PHYS LEVEL 3 - EST PT ICD-10 Diagnosis Description L97.818 Non-pressure chronic ulcer of other part of right lower leg with other specified severity L97.828 Non-pressure chronic ulcer of other part of  left lower leg with other specified severity 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 Quantity: 1 Electronic Signature(s) Signed: 05/29/2021 4:28:08 PM By: Linton Ham MD Entered By: Linton Ham on 05/29/2021 11:58:50

## 2021-05-29 NOTE — Progress Notes (Signed)
HAYA, HEMLER ([id number]) Visit Report for 05/29/2021 Arrival Information Details Patient Name: Date of Service: [name] [name] 05/29/2021 10:30 A M Medical Record Number: [id number] Patient Account Number: [id number] Date of Birth/Sex: Treating RN: 1971-03-21 (51 y.o. [name] Primary Care Alvino Lechuga: [name] Other Clinician: Referring Makoto Sellitto: Treating Evona Westra/Extender: Bernette Redbird in Treatment: 6 Visit Information History Since Last Visit Added or deleted any medications: No Patient Arrived: Wheel Chair Any new allergies or adverse reactions: No Arrival Time: 10:58 Had a fall or experienced change in No Accompanied By: self activities of daily living that may affect Transfer Assistance: Manual risk of falls: Patient Identification Verified: Yes Signs or symptoms of abuse/neglect since last visito No Secondary Verification Process Completed: Yes Hospitalized since last visit: No Patient Requires Transmission-Based Precautions: No Implantable device outside of the clinic excluding No Patient Has Alerts: No cellular tissue based products placed in the center since last visit: Has Dressing in Place as Prescribed: Yes Pain Present Now: Yes Electronic Signature(s) Signed: 05/29/2021 3:01:20 PM By: Sandre Kitty Entered By: Sandre Kitty on 05/29/2021 10:58:33 -------------------------------------------------------------------------------- Clinic Level of Care Assessment Details Patient Name: Date of Service: [name] [name] 05/29/2021 10:30 A M Medical Record Number: [id number] Patient Account Number: [id number] Date of Birth/Sex: Treating RN: [birth year] (51 y.o. Sue Lush Primary Care Jayln Branscom: [name] Other Clinician: Referring Donnielle Addison: Treating Danise Dehne/Extender: Bernette Redbird in Treatment: 6 Clinic Level of Care Assessment Items TOOL 4 Quantity Score X- 1 0 Use when only an  EandM is performed on FOLLOW-UP visit ASSESSMENTS - Nursing Assessment / Reassessment X- 1 10 Reassessment of Co-morbidities (includes updates in patient status) X- 1 5 Reassessment of Adherence to Treatment Plan ASSESSMENTS - Wound and Skin A ssessment / Reassessment []  - 0 Simple Wound Assessment / Reassessment - one wound X- 4 5 Complex Wound Assessment / Reassessment - multiple wounds []  - 0 Dermatologic / Skin Assessment (not related to wound area) ASSESSMENTS - Focused Assessment []  - 0 Circumferential Edema Measurements - multi extremities []  - 0 Nutritional Assessment / Counseling / Intervention []  - 0 Lower Extremity Assessment (monofilament, tuning fork, pulses) []  - 0 Peripheral Arterial Disease Assessment (using hand held doppler) ASSESSMENTS - Ostomy and/or Continence Assessment and Care []  - 0 Incontinence Assessment and Management []  - 0 Ostomy Care Assessment and Management (repouching, etc.) PROCESS - Coordination of Care []  - 0 Simple Patient / Family Education for ongoing care X- 1 20 Complex (extensive) Patient / Family Education for ongoing care []  - 0 Staff obtains Programmer, systems, Records, T Results / Process Orders est []  - 0 Staff telephones HHA, Nursing Homes / Clarify orders / etc []  - 0 Routine Transfer to another Facility (non-emergent condition) []  - 0 Routine Hospital Admission (non-emergent condition) []  - 0 New Admissions / Biomedical engineer / Ordering NPWT Apligraf, etc. , []  - 0 Emergency Hospital Admission (emergent condition) []  - 0 Simple Discharge Coordination []  - 0 Complex (extensive) Discharge Coordination PROCESS - Special Needs []  - 0 Pediatric / Minor Patient Management []  - 0 Isolation Patient Management []  - 0 Hearing / Language / Visual special needs []  - 0 Assessment of Community assistance (transportation, D/C planning, etc.) []  - 0 Additional assistance / Altered mentation []  - 0 Support Surface(s)  Assessment (bed, cushion, seat, etc.) INTERVENTIONS - Wound Cleansing / Measurement []  - 0 Simple Wound Cleansing - one wound X- 4 5 Complex Wound Cleansing - multiple wounds X-  1 5 Wound Imaging (photographs - any number of wounds) []  - 0 Wound Tracing (instead of photographs) []  - 0 Simple Wound Measurement - one wound X- 4 5 Complex Wound Measurement - multiple wounds INTERVENTIONS - Wound Dressings []  - 0 Small Wound Dressing one or multiple wounds []  - 0 Medium Wound Dressing one or multiple wounds X- 2 20 Large Wound Dressing one or multiple wounds X- 1 5 Application of Medications - topical []  - 0 Application of Medications - injection INTERVENTIONS - Miscellaneous []  - 0 External ear exam []  - 0 Specimen Collection (cultures, biopsies, blood, body fluids, etc.) []  - 0 Specimen(s) / Culture(s) sent or taken to Lab for analysis []  - 0 Patient Transfer (multiple staff / Harrel Lemon Lift / Similar devices) []  - 0 Simple Staple / Suture removal (25 or less) []  - 0 Complex Staple / Suture removal (26 or more) []  - 0 Hypo / Hyperglycemic Management (close monitor of Blood Glucose) []  - 0 Ankle / Brachial Index ([name]) - do not check if billed separately X- 1 5 Vital Signs Has the patient been seen at the hospital within the last three years: Yes Total Score: 150 Level Of Care: New/Established - Level 4 Electronic Signature(s) Signed: 05/29/2021 4:43:10 PM By: Lorrin Jackson Entered By: Lorrin Jackson on 05/29/2021 11:39:10 -------------------------------------------------------------------------------- Encounter Discharge Information Details Patient Name: Date of Service: [name] [name], [name] 05/29/2021 10:30 A M Medical Record Number: [id number] Patient Account Number: [id number] Date of Birth/Sex: Treating RN: 1970-12-16 (51 y.o. Sue Lush Primary Care Miray Mancino: [name] Other Clinician: Referring Takako Minckler: Treating Francyne Arreaga/Extender: Bernette Redbird in Treatment: 6 Encounter Discharge Information Items Discharge Condition: Stable Ambulatory Status: Wheelchair Discharge Destination: Home Transportation: Other Schedule Follow-up Appointment: Yes Clinical Summary of Care: Provided on 05/29/2021 Form Type Recipient Paper Patient Patient Electronic Signature(s) Signed: 05/29/2021 3:11:06 PM By: Lorrin Jackson Entered By: Lorrin Jackson on 05/29/2021 15:11:06 -------------------------------------------------------------------------------- Lower Extremity Assessment Details Patient Name: Date of Service: [name], [name] 05/29/2021 10:30 A M Medical Record Number: [id number] Patient Account Number: [id number] Date of Birth/Sex: Treating RN: 07/22/1970 (51 y.o. Sue Lush Primary Care Erline Siddoway: [name] Other Clinician: Referring Latoy Labriola: Treating Maliik Karner/Extender: Farrel Demark Weeks in Treatment: 6 Edema Assessment Assessed: Shirlyn Goltz: Yes] [Right: Yes] Edema: [Left: No] [Right: No] Calf Left: Right: Point of Measurement: 33 cm From Medial Instep 24.6 cm 27 cm Ankle Left: Right: Point of Measurement: 10 cm From Medial Instep 17 cm 20 cm Vascular Assessment Pulses: Dorsalis Pedis Palpable: [Left:Yes] [Right:Yes] Electronic Signature(s) Signed: 05/29/2021 4:43:10 PM By: Lorrin Jackson Entered By: Lorrin Jackson on 05/29/2021 11:04:01 -------------------------------------------------------------------------------- Multi Wound Chart Details Patient Name: Date of Service: [name] [name], [name] 05/29/2021 10:30 A M Medical Record Number: [id number] Patient Account Number: [id number] Date of Birth/Sex: Treating RN: July 28, 1970 (51 y.o. [name] Primary Care Deshanti Adcox: [name] Other Clinician: Referring Dmarcus Decicco: Treating Tobias Avitabile/Extender: Farrel Demark Weeks in Treatment: 6 Vital Signs Height(in): 80 Pulse(bpm):  50 Weight(lbs): Blood Pressure(mmHg): 145/77 Body Mass Index(BMI): Temperature(F): 98.4 Respiratory Rate(breaths/min): 16 Photos: Right, Circumferential Lower Leg Left, Posterior Ankle Left, Medial Ankle Wound Location: Gradually Appeared Gradually Appeared Gradually Appeared Wounding Event: Sickle Cell Lesion Sickle Cell Lesion Sickle Cell Lesion Primary Etiology: Anemia, Sickle Cell Disease, Anemia, Sickle Cell Disease, Anemia, Sickle Cell Disease, Comorbid History: Neuropathy Neuropathy Neuropathy 10/05/2012 12/25/2020 12/25/2020 Date Acquired: 6 6 6  Weeks of Treatment: Open Open Open Wound Status: Yes No No Clustered  Wound: 12x16.1x0.2 3.5x1.6x0.1 4.6x3x0.1 Measurements L x W x D (cm) 151.739 4.398 10.838 A (cm) : rea 30.348 0.44 1.084 Volume (cm) : 19.20% 68.90% 48.90% % Reduction in Area: -61.50% 89.60% 92.70% % Reduction in Volume: Full Thickness Without Exposed Full Thickness Without Exposed Full Thickness Without Exposed Classification: Support Structures Support Structures Support Structures Large Medium Medium Exudate Amount: Purulent Serosanguineous Serosanguineous Exudate Type: yellow, brown, green red, brown red, brown Exudate Color: Distinct, outline attached Distinct, outline attached Distinct, outline attached Wound Margin: Medium (34-66%) Large (67-100%) Large (67-100%) Granulation Amount: Pink Pink, Hyper-granulation Pink, Hyper-granulation Granulation Quality: Medium (34-66%) Small (1-33%) Small (1-33%) Necrotic Amount: Fat Layer (Subcutaneous Tissue): Yes Fat Layer (Subcutaneous Tissue): Yes Fat Layer (Subcutaneous Tissue): Yes Exposed Structures: Fascia: No Fascia: No Fascia: No Tendon: No Tendon: No Tendon: No Muscle: No Muscle: No Muscle: No Joint: No Joint: No Joint: No Bone: No Bone: No Bone: No Medium (34-66%) Medium (34-66%) Medium (34-66%) Epithelialization: Wound Number: 20 N/A N/A Photos: N/A N/A Left, Lateral  Ankle N/A N/A Wound Location: Gradually Appeared N/A N/A Wounding Event: Sickle Cell Lesion N/A N/A Primary Etiology: Anemia, Sickle Cell Disease, N/A N/A Comorbid History: Neuropathy 12/25/2020 N/A N/A Date Acquired: 6 N/A N/A Weeks of Treatment: Open N/A N/A Wound Status: No N/A N/A Clustered Wound: 0.5x0.5x0.1 N/A N/A Measurements L x W x D (cm) 0.196 N/A N/A A (cm) : rea 0.02 N/A N/A Volume (cm) : 92.30% N/A N/A % Reduction in Area: 92.20% N/A N/A % Reduction in Volume: Full Thickness Without Exposed N/A N/A Classification: Support Structures Medium N/A N/A Exudate Amount: Serosanguineous N/A N/A Exudate Type: red, brown N/A N/A Exudate Color: Distinct, outline attached N/A N/A Wound Margin: Large (67-100%) N/A N/A Granulation Amount: Red, Hyper-granulation N/A N/A Granulation Quality: None Present (0%) N/A N/A Necrotic Amount: Fat Layer (Subcutaneous Tissue): Yes N/A N/A Exposed Structures: Fascia: No Tendon: No Muscle: No Joint: No Bone: No Large (67-100%) N/A N/A Epithelialization: Treatment Notes Electronic Signature(s) Signed: 05/29/2021 4:28:08 PM By: Linton Ham MD Signed: 05/29/2021 5:00:37 PM By: Levan Hurst RN, BSN Entered By: Linton Ham on 05/29/2021 11:54:39 -------------------------------------------------------------------------------- Multi-Disciplinary Care Plan Details Patient Name: Date of Service: [name] [name], [name] 05/29/2021 10:30 A M Medical Record Number: [id number] Patient Account Number: [id number] Date of Birth/Sex: Treating RN: 1971/03/02 (51 y.o. Sue Lush Primary Care Venus Gilles: [name] Other Clinician: Referring Chinedu Agustin: Treating Saliyah Gillin/Extender: Farrel Demark Weeks in Treatment: 6 Active Inactive Wound/Skin Impairment Nursing Diagnoses: Impaired tissue integrity Goals: Patient/caregiver will verbalize understanding of skin care regimen Date Initiated:  04/17/2021 Target Resolution Date: 06/26/2021 Goal Status: Active Ulcer/skin breakdown will have a volume reduction of 30% by week 4 Date Initiated: 04/17/2021 Date Inactivated: 05/29/2021 Target Resolution Date: 05/15/2021 Goal Status: Met Ulcer/skin breakdown will have a volume reduction of 50% by week 8 Date Initiated: 05/29/2021 Target Resolution Date: 06/26/2021 Goal Status: Active Interventions: Assess patient/caregiver ability to obtain necessary supplies Assess patient/caregiver ability to perform ulcer/skin care regimen upon admission and as needed Assess ulceration(s) every visit Provide education on ulcer and skin care Treatment Activities: Topical wound management initiated : 04/17/2021 Notes: 06/08/21: Left leg wounds greater than 30% volume reduction, right leg acute infection. Electronic Signature(s) Signed: 05/29/2021 4:43:10 PM By: Lorrin Jackson Entered By: Lorrin Jackson on 05/29/2021 11:09:15 -------------------------------------------------------------------------------- Pain Assessment Details Patient Name: Date of Service: [name], [place] [name] 05/29/2021 10:30 A M Medical Record Number: [id number] Patient Account Number: [id number] Date of Birth/Sex: Treating RN: 1970/12/18 (51 y.o.  Sue Lush Primary Care Keneshia Tena: [name] Other Clinician: Referring Nilaya Bouie: Treating Lachae Hohler/Extender: Farrel Demark Weeks in Treatment: 6 Active Problems Location of Pain Severity and Description of Pain Patient Has Paino Yes Site Locations Pain Location: Pain in Ulcers With Dressing Change: Yes Duration of the Pain. Constant / Intermittento Intermittent Rate the pain. Current Pain Level: 4 Character of Pain Describe the Pain: Burning, Tender, Throbbing Pain Management and Medication Current Pain Management: Medication: Yes Cold Application: No Rest: Yes Massage: No Activity: No T.E.N.S.: No Heat Application: No Leg drop or  elevation: No Is the Current Pain Management Adequate: Adequate How does your wound impact your activities of daily livingo Sleep: No Bathing: No Appetite: No Relationship With Others: No Bladder Continence: No Emotions: No Bowel Continence: No Work: No Toileting: No Drive: No Dressing: No Hobbies: No Electronic Signature(s) Signed: 05/29/2021 4:43:10 PM By: Lorrin Jackson Entered By: Lorrin Jackson on 05/29/2021 11:00:21 -------------------------------------------------------------------------------- Patient/Caregiver Education Details Patient Name: Date of Service: [name] [name], [name] 1/10/2023andnbsp10:30 A M Medical Record Number: [id number] Patient Account Number: [id number] Date of Birth/Gender: Treating RN: 08-Feb-1971 (51 y.o. Sue Lush Primary Care Physician: [name] Other Clinician: Referring Physician: Treating Physician/Extender: Bernette Redbird in Treatment: 6 Education Assessment Education Provided To: Patient Education Topics Provided Infection: Methods: Explain/Verbal, Printed Responses: State content correctly Venous: Methods: Explain/Verbal, Printed Responses: State content correctly Wound/Skin Impairment: Methods: Explain/Verbal, Printed Responses: State content correctly Electronic Signature(s) Signed: 05/29/2021 4:43:10 PM By: Lorrin Jackson Entered By: Lorrin Jackson on 05/29/2021 11:01:32 -------------------------------------------------------------------------------- Wound Assessment Details Patient Name: Date of Service: [name] [name], [name] 05/29/2021 10:30 A M Medical Record Number: [id number] Patient Account Number: [id number] Date of Birth/Sex: Treating RN: [birth year] (51 y.o. Sue Lush Primary Care Monti Villers: [name] Other Clinician: Referring Arnisha Laffoon: Treating Aurelio Mccamy/Extender: Farrel Demark Weeks in Treatment: 6 Wound Status Wound Number: 17 Primary Etiology:  Sickle Cell Lesion Wound Location: Right, Circumferential Lower Leg Wound Status: Open Wounding Event: Gradually Appeared Comorbid History: Anemia, Sickle Cell Disease, Neuropathy Date Acquired: 10/05/2012 Weeks Of Treatment: 6 Clustered Wound: Yes Photos Wound Measurements Length: (cm) 12 Width: (cm) 16.1 Depth: (cm) 0.2 Area: (cm) 151.739 Volume: (cm) 30.348 % Reduction in Area: 19.2% % Reduction in Volume: -61.5% Epithelialization: Medium (34-66%) Tunneling: No Undermining: No Wound Description Classification: Full Thickness Without Exposed Support Structures Wound Margin: Distinct, outline attached Exudate Amount: Large Exudate Type: Purulent Exudate Color: yellow, brown, green Foul Odor After Cleansing: No Slough/Fibrino Yes Wound Bed Granulation Amount: Medium (34-66%) Exposed Structure Granulation Quality: Pink Fascia Exposed: No Necrotic Amount: Medium (34-66%) Fat Layer (Subcutaneous Tissue) Exposed: Yes Necrotic Quality: Adherent Slough Tendon Exposed: No Muscle Exposed: No Joint Exposed: No Bone Exposed: No Treatment Notes Wound #17 (Lower Leg) Wound Laterality: Right, Circumferential Cleanser Soap and Water Discharge Instruction: May shower and wash wound with dial antibacterial soap and water prior to dressing change. Wound Cleanser Discharge Instruction: Cleanse the wound with wound cleanser prior to applying a clean dressing using gauze sponges, not tissue or cotton balls. Peri-Wound Care Triamcinolone 15 (g) Discharge Instruction: Use triamcinolone 15 (g) as directed Sween Lotion (Moisturizing lotion) Discharge Instruction: Apply moisturizing lotion as directed Topical Antibiotic Ointment Primary Dressing Hydrofera Blue Ready Foam, 2.5 x2.5 in Discharge Instruction: Apply to wound bed as instructed Secondary Dressing ABD Pad, 5x9 Discharge Instruction: Apply over primary dressing as directed. Secured With Compression Wrap Kerlix Roll  4.5x3.1 (in/yd) Discharge Instruction: Apply Kerlix and Coban compression as directed.  Coban Self-Adherent Wrap 4x5 (in/yd) Discharge Instruction: Apply over Kerlix as directed. Compression Stockings Add-Ons Electronic Signature(s) Signed: 05/29/2021 3:01:20 PM By: Sandre Kitty Signed: 05/29/2021 4:43:10 PM By: Lorrin Jackson Entered By: Sandre Kitty on 05/29/2021 11:13:48 -------------------------------------------------------------------------------- Wound Assessment Details Patient Name: Date of Service: [name], [name] 05/29/2021 10:30 A M Medical Record Number: [id number] Patient Account Number: [id number] Date of Birth/Sex: Treating RN: 1970-08-02 (51 y.o. Sue Lush Primary Care Maximum Reiland: [name] Other Clinician: Referring Annalyssa Thune: Treating Kaelee Pfeffer/Extender: Farrel Demark Weeks in Treatment: 6 Wound Status Wound Number: 18 Primary Etiology: Sickle Cell Lesion Wound Location: Left, Posterior Ankle Wound Status: Open Wounding Event: Gradually Appeared Comorbid History: Anemia, Sickle Cell Disease, Neuropathy Date Acquired: 12/25/2020 Weeks Of Treatment: 6 Clustered Wound: No Photos Wound Measurements Length: (cm) 3.5 Width: (cm) 1.6 Depth: (cm) 0.1 Area: (cm) 4.398 Volume: (cm) 0.44 % Reduction in Area: 68.9% % Reduction in Volume: 89.6% Epithelialization: Medium (34-66%) Tunneling: No Undermining: No Wound Description Classification: Full Thickness Without Exposed Support Structures Wound Margin: Distinct, outline attached Exudate Amount: Medium Exudate Type: Serosanguineous Exudate Color: red, brown Wound Bed Granulation Amount: Large (67-100%) Granulation Quality: Pink, Hyper-granulation Necrotic Amount: Small (1-33%) Necrotic Quality: Adherent Slough Foul Odor After Cleansing: No Slough/Fibrino Yes Exposed Structure Fascia Exposed: No Fat Layer (Subcutaneous Tissue) Exposed: Yes Tendon Exposed: No Muscle  Exposed: No Joint Exposed: No Bone Exposed: No Treatment Notes Wound #18 (Ankle) Wound Laterality: Left, Posterior Cleanser Soap and Water Discharge Instruction: May shower and wash wound with dial antibacterial soap and water prior to dressing change. Wound Cleanser Discharge Instruction: Cleanse the wound with wound cleanser prior to applying a clean dressing using gauze sponges, not tissue or cotton balls. Peri-Wound Care Triamcinolone 15 (g) Discharge Instruction: Use triamcinolone 15 (g) as directed Sween Lotion (Moisturizing lotion) Discharge Instruction: Apply moisturizing lotion as directed Topical Antibiotic Ointment Primary Dressing Hydrofera Blue Ready Foam, 2.5 x2.5 in Discharge Instruction: Apply to wound bed as instructed Secondary Dressing ABD Pad, 5x9 Discharge Instruction: Apply over primary dressing as directed. Secured With Compression Wrap Kerlix Roll 4.5x3.1 (in/yd) Discharge Instruction: Apply Kerlix and Coban compression as directed. Coban Self-Adherent Wrap 4x5 (in/yd) Discharge Instruction: Apply over Kerlix as directed. Compression Stockings Add-Ons Electronic Signature(s) Signed: 05/29/2021 3:01:20 PM By: Sandre Kitty Signed: 05/29/2021 4:43:10 PM By: Lorrin Jackson Entered By: Sandre Kitty on 05/29/2021 11:14:48 -------------------------------------------------------------------------------- Wound Assessment Details Patient Name: Date of Service: [name], [name] 05/29/2021 10:30 A M Medical Record Number: [id number] Patient Account Number: [id number] Date of Birth/Sex: Treating RN: [birth year] (51 y.o. Sue Lush Primary Care Valecia Beske: [name] Other Clinician: Referring Rawn Quiroa: Treating Irlene Crudup/Extender: Farrel Demark Weeks in Treatment: 6 Wound Status Wound Number: 19 Primary Etiology: Sickle Cell Lesion Wound Location: Left, Medial Ankle Wound Status: Open Wounding Event: Gradually  Appeared Comorbid History: Anemia, Sickle Cell Disease, Neuropathy Date Acquired: 12/25/2020 Weeks Of Treatment: 6 Clustered Wound: No Photos Wound Measurements Length: (cm) 4.6 Width: (cm) 3 Depth: (cm) 0.1 Area: (cm) 10.838 Volume: (cm) 1.084 % Reduction in Area: 48.9% % Reduction in Volume: 92.7% Epithelialization: Medium (34-66%) Tunneling: No Undermining: No Wound Description Classification: Full Thickness Without Exposed Support Structures Wound Margin: Distinct, outline attached Exudate Amount: Medium Exudate Type: Serosanguineous Exudate Color: red, brown Foul Odor After Cleansing: No Slough/Fibrino Yes Wound Bed Granulation Amount: Large (67-100%) Exposed Structure Granulation Quality: Pink, Hyper-granulation Fascia Exposed: No Necrotic Amount: Small (1-33%) Fat Layer (Subcutaneous Tissue) Exposed: Yes Necrotic Quality: Adherent Slough Tendon Exposed:  No Muscle Exposed: No Joint Exposed: No Bone Exposed: No Treatment Notes Wound #19 (Ankle) Wound Laterality: Left, Medial Cleanser Soap and Water Discharge Instruction: May shower and wash wound with dial antibacterial soap and water prior to dressing change. Wound Cleanser Discharge Instruction: Cleanse the wound with wound cleanser prior to applying a clean dressing using gauze sponges, not tissue or cotton balls. Peri-Wound Care Triamcinolone 15 (g) Discharge Instruction: Use triamcinolone 15 (g) as directed Sween Lotion (Moisturizing lotion) Discharge Instruction: Apply moisturizing lotion as directed Topical Antibiotic Ointment Primary Dressing Hydrofera Blue Ready Foam, 2.5 x2.5 in Discharge Instruction: Apply to wound bed as instructed Secondary Dressing ABD Pad, 5x9 Discharge Instruction: Apply over primary dressing as directed. Secured With Compression Wrap Kerlix Roll 4.5x3.1 (in/yd) Discharge Instruction: Apply Kerlix and Coban compression as directed. Coban Self-Adherent Wrap 4x5  (in/yd) Discharge Instruction: Apply over Kerlix as directed. Compression Stockings Add-Ons Electronic Signature(s) Signed: 05/29/2021 3:01:20 PM By: Sandre Kitty Signed: 05/29/2021 4:43:10 PM By: Lorrin Jackson Entered By: Sandre Kitty on 05/29/2021 11:15:27 -------------------------------------------------------------------------------- Wound Assessment Details Patient Name: Date of Service: [name] [name], [name] 05/29/2021 10:30 A M Medical Record Number: [id number] Patient Account Number: [id number] Date of Birth/Sex: Treating RN: [birth year] (51 y.o. Sue Lush Primary Care Jhett Fretwell: [name] Other Clinician: Referring Currie Dennin: Treating Sherwood Castilla/Extender: Farrel Demark Weeks in Treatment: 6 Wound Status Wound Number: 20 Primary Etiology: Sickle Cell Lesion Wound Location: Left, Lateral Ankle Wound Status: Open Wounding Event: Gradually Appeared Comorbid History: Anemia, Sickle Cell Disease, Neuropathy Date Acquired: 12/25/2020 Weeks Of Treatment: 6 Clustered Wound: No Photos Wound Measurements Length: (cm) 0.5 Width: (cm) 0.5 Depth: (cm) 0.1 Area: (cm) 0.196 Volume: (cm) 0.02 % Reduction in Area: 92.3% % Reduction in Volume: 92.2% Epithelialization: Large (67-100%) Tunneling: No Undermining: No Wound Description Classification: Full Thickness Without Exposed Support Structures Wound Margin: Distinct, outline attached Exudate Amount: Medium Exudate Type: Serosanguineous Exudate Color: red, brown Foul Odor After Cleansing: No Slough/Fibrino No Wound Bed Granulation Amount: Large (67-100%) Exposed Structure Granulation Quality: Red, Hyper-granulation Fascia Exposed: No Necrotic Amount: None Present (0%) Fat Layer (Subcutaneous Tissue) Exposed: Yes Tendon Exposed: No Muscle Exposed: No Joint Exposed: No Bone Exposed: No Treatment Notes Wound #20 (Ankle) Wound Laterality: Left, Lateral Cleanser Soap and Water Discharge  Instruction: May shower and wash wound with dial antibacterial soap and water prior to dressing change. Wound Cleanser Discharge Instruction: Cleanse the wound with wound cleanser prior to applying a clean dressing using gauze sponges, not tissue or cotton balls. Peri-Wound Care Triamcinolone 15 (g) Discharge Instruction: Use triamcinolone 15 (g) as directed Sween Lotion (Moisturizing lotion) Discharge Instruction: Apply moisturizing lotion as directed Topical Antibiotic Ointment Primary Dressing Hydrofera Blue Ready Foam, 2.5 x2.5 in Discharge Instruction: Apply to wound bed as instructed Secondary Dressing ABD Pad, 5x9 Discharge Instruction: Apply over primary dressing as directed. Secured With Compression Wrap Kerlix Roll 4.5x3.1 (in/yd) Discharge Instruction: Apply Kerlix and Coban compression as directed. Coban Self-Adherent Wrap 4x5 (in/yd) Discharge Instruction: Apply over Kerlix as directed. Compression Stockings Add-Ons Electronic Signature(s) Signed: 05/29/2021 3:01:20 PM By: Sandre Kitty Signed: 05/29/2021 4:43:10 PM By: Lorrin Jackson Entered By: Sandre Kitty on 05/29/2021 11:16:47 -------------------------------------------------------------------------------- [place] Details Patient Name: Date of Service: [name], [name] 05/29/2021 10:30 A M Medical Record Number: [id number] Patient Account Number: [id number] Date of Birth/Sex: Treating RN: 14-Jan-1971 (51 y.o. Sue Lush Primary Care Pershing Skidmore: [name] Other Clinician: Referring Lashica Hannay: Treating Tahlia Deamer/Extender: Farrel Demark Weeks in Treatment: 6 Vital Signs  Time Taken: 10:59 Temperature (F): 98.4 Height (in): 67 Pulse (bpm): 77 Respiratory Rate (breaths/min): 16 Blood Pressure (mmHg): 145/77 Reference Range: 80 - 120 mg / dl Electronic Signature(s) Signed: 05/29/2021 4:43:10 PM By: Lorrin Jackson Entered By: Lorrin Jackson on 05/29/2021 10:59:31

## 2021-05-31 ENCOUNTER — Other Ambulatory Visit (HOSPITAL_COMMUNITY): Payer: Self-pay | Admitting: Internal Medicine

## 2021-05-31 DIAGNOSIS — L97828 Non-pressure chronic ulcer of other part of left lower leg with other specified severity: Secondary | ICD-10-CM

## 2021-06-01 ENCOUNTER — Ambulatory Visit (HOSPITAL_COMMUNITY)
Admission: RE | Admit: 2021-06-01 | Discharge: 2021-06-01 | Disposition: A | Discharge status: Home / Self Care | Payer: Medicaid Other | Source: Ambulatory Visit | Attending: Infectious Diseases | Admitting: Infectious Diseases

## 2021-06-01 ENCOUNTER — Other Ambulatory Visit: Payer: Self-pay

## 2021-06-01 DIAGNOSIS — L97828 Non-pressure chronic ulcer of other part of left lower leg with other specified severity: Secondary | ICD-10-CM | POA: Diagnosis present

## 2021-06-04 ENCOUNTER — Telehealth: Payer: Self-pay

## 2021-06-04 ENCOUNTER — Other Ambulatory Visit: Payer: Self-pay | Admitting: Family Medicine

## 2021-06-04 DIAGNOSIS — G894 Chronic pain syndrome: Secondary | ICD-10-CM

## 2021-06-04 MED ORDER — OXYCODONE HCL 10 MG PO TABS: 10.0000 mg | ORAL_TABLET | ORAL | 0 refills | Status: DC

## 2021-06-04 NOTE — Telephone Encounter (Signed)
Oxycodone  °

## 2021-06-04 NOTE — Progress Notes (Signed)
Reviewed PDMP substance reporting system prior to prescribing opiate medications. No inconsistencies noted.  Meds ordered this encounter  Medications   Oxycodone HCl 10 MG TABS    Sig: Take 1 tablet (10 mg total) by mouth every 4 (four) hours while awake.    Dispense:  90 tablet    Refill:  0    Order Specific Question:   Supervising Provider    Answer:   JEGEDE, OLUGBEMIGA E [[id number]]   Rayder Sullenger Moore Robin Petrakis  APRN, MSN, FNP-C [facility] [address]  [place], [place] [address] [phone]  

## 2021-06-05 ENCOUNTER — Encounter (HOSPITAL_BASED_OUTPATIENT_CLINIC_OR_DEPARTMENT_OTHER): Payer: Medicaid Other | Admitting: Internal Medicine

## 2021-06-05 ENCOUNTER — Other Ambulatory Visit: Payer: Self-pay | Admitting: Nurse Practitioner

## 2021-06-05 ENCOUNTER — Other Ambulatory Visit: Payer: Medicaid Other

## 2021-06-05 ENCOUNTER — Other Ambulatory Visit: Payer: Self-pay

## 2021-06-05 DIAGNOSIS — D571 Sickle-cell disease without crisis: Secondary | ICD-10-CM

## 2021-06-05 DIAGNOSIS — L97818 Non-pressure chronic ulcer of other part of right lower leg with other specified severity: Secondary | ICD-10-CM | POA: Diagnosis not present

## 2021-06-06 LAB — COMPREHENSIVE METABOLIC PANEL
ALT: 32 IU/L (ref 0–32)
AST: 44 IU/L — ABNORMAL HIGH (ref 0–40)
Albumin/Globulin Ratio: 1.4 (ref 1.2–2.2)
Albumin: 4.6 g/dL (ref 3.8–4.8)
Alkaline Phosphatase: 75 IU/L (ref 44–121)
BUN/Creatinine Ratio: 22 (ref 9–23)
BUN: 13 mg/dL (ref 6–24)
Bilirubin Total: 2.5 mg/dL — ABNORMAL HIGH (ref 0.0–1.2)
CO2: 23 mmol/L (ref 20–29)
Calcium: 9.5 mg/dL (ref 8.7–10.2)
Chloride: 102 mmol/L (ref 96–106)
Creatinine, Ser: 0.6 mg/dL (ref 0.57–1.00)
Globulin, Total: 3.4 g/dL (ref 1.5–4.5)
Glucose: 93 mg/dL (ref 70–99)
Potassium: 4.1 mmol/L (ref 3.5–5.2)
Sodium: 138 mmol/L (ref 134–144)
Total Protein: 8 g/dL (ref 6.0–8.5)
eGFR: 110 mL/min/{1.73_m2} (ref >59)

## 2021-06-06 LAB — CBC WITH DIFFERENTIAL/PLATELET
Basophils Absolute: 0.2 10*3/uL (ref 0.0–0.2)
Basos: 2 %
EOS (ABSOLUTE): 1.4 10*3/uL — ABNORMAL HIGH (ref 0.0–0.4)
Eos: 10 %
Hematocrit: 23.2 % — ABNORMAL LOW (ref 34.0–46.6)
Hemoglobin: 7.6 g/dL — ABNORMAL LOW (ref 11.1–15.9)
Immature Grans (Abs): 0.1 10*3/uL (ref 0.0–0.1)
Immature Granulocytes: 1 %
Lymphocytes Absolute: 3.1 10*3/uL (ref 0.7–3.1)
Lymphs: 23 %
MCH: 29 pg (ref 26.6–33.0)
MCHC: 32.8 g/dL (ref 31.5–35.7)
MCV: 89 fL (ref 79–97)
Monocytes Absolute: 1.3 10*3/uL — ABNORMAL HIGH (ref 0.1–0.9)
Monocytes: 10 %
NRBC: 4 % — ABNORMAL HIGH (ref 0–0)
Neutrophils Absolute: 7.3 10*3/uL — ABNORMAL HIGH (ref 1.4–7.0)
Neutrophils: 54 %
Platelets: 447 10*3/uL (ref 150–450)
RBC: 2.62 x10E6/uL — CL (ref 3.77–5.28)
RDW: 15.8 % — ABNORMAL HIGH (ref 11.7–15.4)
WBC: 13.4 10*3/uL — ABNORMAL HIGH (ref 3.4–10.8)

## 2021-06-06 LAB — RETICULOCYTES: Retic Ct Pct: 8.3 % — ABNORMAL HIGH (ref 0.6–2.6)

## 2021-06-06 NOTE — Progress Notes (Addendum)
[name], [name] ([id number]) Visit Report for 06/05/2021 HPI Details Patient Name: Date of Service: [name] [name] 06/05/2021 10:45 A M Medical Record Number: [id number] Patient Account Number: [id number] Date of Birth/Sex: Treating RN: Dec 10, 1970 (51 y.o. [name] Primary Care Provider: [name] Other Clinician: Referring Provider: Treating Provider/Extender: Farrel Demark Weeks in Treatment: 7 History of Present Illness Location: medial and lateral ankle region on the right and left medial malleolus Quality: Patient reports experiencing a shooting pain to affected area(s). Severity: Patient states wound(s) are getting worse. Duration: right lower extremity bimalleolar ulcers have been present for approximately 2 years; the rright meedial malleolus ulcer has been there proximally 6 months Timing: Pain in wound is constant (hurts all the time) Context: The wound would happen gradually ssociated Signs and Symptoms: Patient reports having increase discharge. A HPI Description: 51 year old patient with a history of sickle cell anemia who was last seen by me with ulceration of the right lower extremity above the ankle and was referred to Dr. Leland Johns for a surgical debridement as I was unable to do anything in the office due to excruciating pain. At that stage she was referred from the plastic surgery service to dermatology who treated her for a skin infection with doxycycline and then Levaquin and a local antibiotic ointment. I understand the patient has since developed ulceration on the left ankle both medial and lateral and was now referred back to the wound center as dermatology has finished the management. I do not have any notes from the dermatology department Old notes: 51 year old patient with a history of sickle cell anemia, pain bilateral lower extremities, right lower extremity ulcer and has a history of receiving a skin graft( Theraskin) several  months ago. She has been visiting the wound center [facility] and was seen by Dr. Dellia Nims and Dr. Leland Johns. after prolonged conservator therapy between July 2016 and January 2017. She had been seen by the plastic surgeon and taken to the OR for debridement and application of Theraskin. She had 3 applications of Theraskin and was then treated with collagen. Prior to that she had a history of similar problems in 2014 and was treated conservatively. Had a reflux study done for the right lower extremity in August 2016 without reflux or DVT . Past medical history significant for sickle cell disease, anemia, leg ulcers, cholelithiasis,and has never been a smoker. Once the patient was discharged on the wound center she says within 2 or 3 weeks the problems recurred and she has been treating it conservatively. since I saw her 3 weeks ago at [facility] she has been unable to get her dressing material but has completed a course of doxycycline. 6/7/ 2017 -- lower extremity venous duplex reflux evaluation was done No evidence of SVT or DVT in the RLL. No venous incompetence in the RLL. No further vascular workup is indicated at this time. She was seen by Dr. [name], on 10/04/2015. She agreed with the plan of taking her to the OR for debridement and application of theraskin and would also take biopsies to rule out pyoderma gangrenosum. Follow-up note dated May 31 received and she was status post application of Theraskin to multiple ulcers around the right ankle. Pathology did not show evidence of malignancy or pyoderma gangrenosum. She would continue to see as in the wound clinic for further care and see Dr. Leland Johns as needed. The patient brought the biopsy report and it was consistent with stasis ulcer no evidence of malignancy and  the comment was that there was some adjacent neovascularization, fibrosis and patchy perivascular chronic inflammation. 11/15/2015 -- today we applied her first application  of Theraskin 11/30/15; TheraSkin #2 12/13/2015 -- she is having a lot of pain locally and is here for possible application of a theraskin today. 01/16/2016 -- the patient has significant pain and has noticed despite in spite of all local care and oral pain medication. It is impossible to debride her in the office. 02/06/2016 -- I do not see any notes from Dr. Iran Planas( the patient has not made a call to the office know as she heard from them) and the only visit to recently was with her PCP Dr. Danella Penton -- I saw her on 01/16/2016 and prescribed 90 tablets of oxycodone 10 mg and did lab work and screening for HIV. the HIV was negative and hemoglobin was 6.3 with a WBC count of 14.9 and hematocrit of 17.8 with platelets of 561. 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 The bimalleolar ulcers to the right lower extremity have been present for approximately 2 years, the left medial malleolus ulcer has been present approximately 6 months. She has followed with Dr.Thimmappa in the past and has had a total of 3 applications of Theraskin (01/2015, 09/2015, 06/17/16). She has also followed with Dr. Con Memos here in the clinic and has received 2 applications of TheraSkin (11/10/15, 11/30/15). The patient does experience chronic, and is not amenable to debridement. 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 She is not currently on any antibiotic therapy and has not been treated with any recently. 07/17/2016 -- was seen by Dr. Iran Planas of plastic surgery who saw her 2 weeks postop application of Theraskin #3. She had removed her dressing and asked her to apply silver alginate on alternate days and follow-up back with the wound center. Future debridements and application of skin substitute would have to be done in the hospital due to her high risk for  anesthesia. READMISSION 04/17/2021 Patient is now a 51 year old woman that we have had in this clinic for a prolonged period of time and 2016-2017 and then again for 2 visits in February 2018. At that point she had wounds on the right lower leg predominantly medial. She had also been seen by plastic surgery Dr. Leland Johns who I believe took her to the OR for operative debridement and application of TheraSkin in 2017. After she left our clinic she was followed for a very prolonged period of time in the wound care center in [facility] who then referred her ultimately to [facility] where she was seen by Dr. Vernona Rieger. Again taken her to the OR for skin grafting which apparently did not take. She had multiple other attempts at dressings although I have not really looked over all of these notes in great detail. She has not been seen in a wound care center in about a year. She states over the last year in addition to her right lower leg she has developed wounds on the left lower leg quite extensive. She is using Xeroform to all of these wounds without really any improvement. She also has Medicaid which does not cover wound products. The patient has had vascular work-ups in the past including most recently on 03/28/2021 showing biphasic waveforms on the right triphasic at the PTA and biphasic at the dorsalis pedis on the left. She  was unable to tolerate any degree of compression to do ABIs. Unfortunately TBI's were also not done. She had venous reflux studies done in 2017. This did not show any evidence of a DVT or SVT and no venous incompetence was noted in the right leg at the time this was the only side with the wound As noted I did not look all over her old records. She apparently had a course of HBO and [facility] although I am not sure what the indication would have been. In any case she developed seizures and terminated treatment earlier. She is generally much more disabled than when we last saw her in clinic.  She can no longer walk pretty much wheelchair-bound because predominantly of pain in the left hip. 04/24/2021; the patient tolerated the wraps we put on. We used Santyl and Hydrofera Blue under compression. I brought her back for a nurse visit for a change in dressing. With Medicaid we will have a hard time getting anything paid for and hence the need for compression. She arrives in clinic with all the wounds looking somewhat better in terms of surface 12/20; circumferential wound on the right from the lateral to the medial. She has open areas on the left medial and left lateral x2 on all of this with the same surface. This does not look completely healthy although she does have some epithelialization. She is not complaining of a lot of pain which is unusual for her sickle ulcers. I have not looked over her extensive records from [facility]. She had recent arterial studies and has a history of venous reflux studies I will need to look these over although I do not believe she has significant arterial disease 2023 05/22/2021; patient's wound areas measure slightly smaller. Still a lot of drainage coming from the right we have been using Hydrofera Blue and Santyl with some improvement in the wound surfaces. She tells me she will be getting transfused later in the week for her underlying sickle cell anemia I have looked over her recent arterial studies which were done in the fall. This was in November and showed biphasic and triphasic waveforms but she could not tolerate ABIs because of pressure and unfortunately TBI's were not done. She has not had recent venous reflux studies that I can see 1/10; not much change about the same surface area. This has a yellowish surface to it very gritty. We have been using Santyl and Hydrofera Blue for a prolonged period. Culture I did last week showed methicillin sensitive staph aureus "rare". Our intake nurse reports greenish drainage which may be the Hydrofera Blue  itself 1/17; wounds are continue to measure smaller although I am not sure about the accuracy here. Especially the areas on the right are covered in what looks to be a nonviable surface although she does have some epithelialization. Similarly she has areas on the left medial and left lateral ankle area which appear to have a better surface and perhaps are slightly smaller. We have been using Santyl and Hydrofera Blue. She cannot tolerate mechanical debridements She went for her reflux studies which showed significant reflux at the greater saphenous vein at the saphenofemoral junction as well as the greater saphenous vein in the proximal calf on the left she had reflux in the thigh and the common femoral vein and supra vein Fishel vein reflux in the greater saphenous vein. I will have vein and vascular look at this. My thoughts have been that these are likely sickle wounds. I looked  through her old records from [facility] wound care center and then when she graduated to [facility] wound care center where she saw Dr. Zigmund Daniel and Dr. Vernona Rieger. Although I can see she had reflux studies done I do not see that she actually saw a vein and vascular. I went over the fact that she had operative debridements and actual skin grafting that did not take. I do not think these wounds have ever really progressed towards healing Electronic Signature(s) Signed: 06/05/2021 4:48:32 PM By: Linton Ham MD Entered By: Linton Ham on 06/05/2021 58:52:77 -------------------------------------------------------------------------------- Physical Exam Details Patient Name: Date of Service: [name] [name], [name] 06/05/2021 10:45 A M Medical Record Number: [id number] Patient Account Number: [id number] Date of Birth/Sex: Treating RN: 1970/11/16 (52 y.o. [name] Primary Care Provider: [name] Other Clinician: Referring Provider: Treating Provider/Extender: Farrel Demark Weeks in  Treatment: 7 Constitutional Sitting or standing Blood Pressure is within target range for patient.. Pulse regular and within target range for patient.Marland Kitchen Respirations regular, non-labored and within target range.. Temperature is normal and within the target range for the patient.Marland Kitchen Appears in no distress. Notes Wound exam; almost circumferential wound of the right lower leg small bridge of skin posteriorly going over the lateral and medial ankles. The same surface which does not look completely viable although she does have some epithelialization. This does not wipe off with saline and gauze. On the left side she has wounds on the left medial and left lateral not quite as bad as on the right side. Again she cannot tolerate debridement even in the smaller areas. I do not believe she has an arterial issue Electronic Signature(s) Signed: 06/05/2021 4:48:32 PM By: Linton Ham MD Entered By: Linton Ham on 06/05/2021 12:33:05 -------------------------------------------------------------------------------- Physician Orders Details Patient Name: Date of Service: [name], [name] 06/05/2021 10:45 A M Medical Record Number: [id number] Patient Account Number: [id number] Date of Birth/Sex: Treating RN: January 22, 1971 (51 y.o. [name] Primary Care Provider: [name] Other Clinician: Referring Provider: Treating Provider/Extender: Bernette Redbird in Treatment: 7 Verbal / Phone Orders: No Diagnosis Coding ICD-10 Coding Code Description L97.818 Non-pressure chronic ulcer of other part of right lower leg with other specified severity L97.828 Non-pressure chronic ulcer of other part of left lower leg with other specified severity 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 Follow-up Appointments ppointment in 1 week. - Dr. Dellia Nims Return A Bathing/ Shower/ Hygiene May shower with protection but do not get wound dressing(s) wet. - Can get cast protector bags at  [facility] or [facility] Additional Orders / Instructions Follow Nutritious Diet Wound Treatment Wound #17 - Lower Leg Wound Laterality: Right, Circumferential Cleanser: Soap and Water 2 x Per Week/30 Days Discharge Instructions: May shower and wash wound with dial antibacterial soap and water prior to dressing change. Cleanser: Wound Cleanser 2 x Per Week/30 Days Discharge Instructions: Cleanse the wound with wound cleanser prior to applying a clean dressing using gauze sponges, not tissue or cotton balls. Peri-Wound Care: Triamcinolone 15 (g) 2 x Per Week/30 Days Discharge Instructions: Use triamcinolone 15 (g) as directed Peri-Wound Care: Sween Lotion (Moisturizing lotion) 2 x Per Week/30 Days Discharge Instructions: Apply moisturizing lotion as directed Topical: Antibiotic Ointment 2 x Per Week/30 Days Prim Dressing: Hydrofera Blue Classic Foam, 4x4 in 2 x Per Week/30 Days ary Discharge Instructions: Moisten with saline prior to applying to wound bed Secondary Dressing: ABD Pad, 5x9 2 x Per Week/30 Days Discharge Instructions: Apply over primary dressing  as directed. Compression Wrap: Kerlix Roll 4.5x3.1 (in/yd) 2 x Per Week/30 Days Discharge Instructions: Apply Kerlix and Coban compression as directed. Compression Wrap: Coban Self-Adherent Wrap 4x5 (in/yd) 2 x Per Week/30 Days Discharge Instructions: Apply over Kerlix as directed. Wound #18 - Ankle Wound Laterality: Left, Posterior Cleanser: Soap and Water 2 x Per Week/30 Days Discharge Instructions: May shower and wash wound with dial antibacterial soap and water prior to dressing change. Cleanser: Wound Cleanser 2 x Per Week/30 Days Discharge Instructions: Cleanse the wound with wound cleanser prior to applying a clean dressing using gauze sponges, not tissue or cotton balls. Peri-Wound Care: Triamcinolone 15 (g) 2 x Per Week/30 Days Discharge Instructions: Use triamcinolone 15 (g) as directed Peri-Wound Care: Sween Lotion (Moisturizing  lotion) 2 x Per Week/30 Days Discharge Instructions: Apply moisturizing lotion as directed Topical: Antibiotic Ointment 2 x Per Week/30 Days Prim Dressing: Hydrofera Blue Classic Foam, 4x4 in 2 x Per Week/30 Days ary Discharge Instructions: Moisten with saline prior to applying to wound bed Secondary Dressing: ABD Pad, 5x9 2 x Per Week/30 Days Discharge Instructions: Apply over primary dressing as directed. Compression Wrap: Kerlix Roll 4.5x3.1 (in/yd) 2 x Per Week/30 Days Discharge Instructions: Apply Kerlix and Coban compression as directed. Compression Wrap: Coban Self-Adherent Wrap 4x5 (in/yd) 2 x Per Week/30 Days Discharge Instructions: Apply over Kerlix as directed. Wound #19 - Ankle Wound Laterality: Left, Medial Cleanser: Soap and Water 2 x Per Week/30 Days Discharge Instructions: May shower and wash wound with dial antibacterial soap and water prior to dressing change. Cleanser: Wound Cleanser 2 x Per Week/30 Days Discharge Instructions: Cleanse the wound with wound cleanser prior to applying a clean dressing using gauze sponges, not tissue or cotton balls. Peri-Wound Care: Triamcinolone 15 (g) 2 x Per Week/30 Days Discharge Instructions: Use triamcinolone 15 (g) as directed Peri-Wound Care: Sween Lotion (Moisturizing lotion) 2 x Per Week/30 Days Discharge Instructions: Apply moisturizing lotion as directed Topical: Antibiotic Ointment 2 x Per Week/30 Days Prim Dressing: Hydrofera Blue Classic Foam, 4x4 in 2 x Per Week/30 Days ary Discharge Instructions: Moisten with saline prior to applying to wound bed Secondary Dressing: ABD Pad, 5x9 2 x Per Week/30 Days Discharge Instructions: Apply over primary dressing as directed. Compression Wrap: Kerlix Roll 4.5x3.1 (in/yd) 2 x Per Week/30 Days Discharge Instructions: Apply Kerlix and Coban compression as directed. Compression Wrap: Coban Self-Adherent Wrap 4x5 (in/yd) 2 x Per Week/30 Days Discharge Instructions: Apply over Kerlix as  directed. Wound #20 - Ankle Wound Laterality: Left, Lateral Cleanser: Soap and Water 2 x Per Week/30 Days Discharge Instructions: May shower and wash wound with dial antibacterial soap and water prior to dressing change. Cleanser: Wound Cleanser 2 x Per Week/30 Days Discharge Instructions: Cleanse the wound with wound cleanser prior to applying a clean dressing using gauze sponges, not tissue or cotton balls. Peri-Wound Care: Triamcinolone 15 (g) 2 x Per Week/30 Days Discharge Instructions: Use triamcinolone 15 (g) as directed Peri-Wound Care: Sween Lotion (Moisturizing lotion) 2 x Per Week/30 Days Discharge Instructions: Apply moisturizing lotion as directed Topical: Antibiotic Ointment 2 x Per Week/30 Days Prim Dressing: Hydrofera Blue Classic Foam, 4x4 in 2 x Per Week/30 Days ary Discharge Instructions: Moisten with saline prior to applying to wound bed Secondary Dressing: ABD Pad, 5x9 2 x Per Week/30 Days Discharge Instructions: Apply over primary dressing as directed. Compression Wrap: Kerlix Roll 4.5x3.1 (in/yd) 2 x Per Week/30 Days Discharge Instructions: Apply Kerlix and Coban compression as directed. Compression Wrap: Coban Self-Adherent Wrap 4x5 (  in/yd) 2 x Per Week/30 Days Discharge Instructions: Apply over Kerlix as directed. Consults Vascular Surgeon - Non healing wounds on bilateral lower legs, abnormal reflux study 1/13 - (ICD10 L97.818 - Non-pressure chronic ulcer of other part of right lower leg with other specified severity) Electronic Signature(s) Signed: 06/05/2021 4:48:32 PM By: Linton Ham MD Signed: 06/06/2021 5:10:09 PM By: Levan Hurst RN, BSN Entered By: Levan Hurst on 06/05/2021 11:49:51 -------------------------------------------------------------------------------- Problem List Details Patient Name: Date of Service: [name] [name], [name] 06/05/2021 10:45 A M Medical Record Number: [id number] Patient Account Number: [id number] Date of  Birth/Sex: Treating RN: [birth year] (51 y.o. [name] Primary Care Provider: [name] Other Clinician: Referring Provider: Treating Provider/Extender: Farrel Demark Weeks in Treatment: 7 Active Problems ICD-10 Encounter Code Description Active Date MDM Diagnosis L97.818 Non-pressure chronic ulcer of other part of right lower leg with other specified 04/17/2021 No Yes severity L97.828 Non-pressure chronic ulcer of other part of left lower leg with other specified 04/17/2021 No Yes severity 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 04/17/2021 No Yes Inactive Problems Resolved Problems Electronic Signature(s) Signed: 06/05/2021 4:48:32 PM By: Linton Ham MD Entered By: Linton Ham on 06/05/2021 12:21:33 -------------------------------------------------------------------------------- Progress Note Details Patient Name: Date of Service: [name], [name] 06/05/2021 10:45 A M Medical Record Number: [id number] Patient Account Number: [id number] Date of Birth/Sex: Treating RN: 10-May-1971 (51 y.o. [name] Primary Care Provider: [name] Other Clinician: Referring Provider: Treating Provider/Extender: Farrel Demark Weeks in Treatment: 7 Subjective History of Present Illness (HPI) The following HPI elements were documented for the patient's wound: Location: medial and lateral ankle region on the right and left medial malleolus Quality: Patient reports experiencing a shooting pain to affected area(s). Severity: Patient states wound(s) are getting worse. Duration: right lower extremity bimalleolar ulcers have been present for approximately 2 years; the rright meedial malleolus ulcer has been there proximally 6 months Timing: Pain in wound is constant (hurts all the time) Context: The wound would happen gradually Associated Signs and Symptoms: Patient reports having increase discharge. 51 year old patient with  a history of sickle cell anemia who was last seen by me with ulceration of the right lower extremity above the ankle and was referred to Dr. Leland Johns for a surgical debridement as I was unable to do anything in the office due to excruciating pain. At that stage she was referred from the plastic surgery service to dermatology who treated her for a skin infection with doxycycline and then Levaquin and a local antibiotic ointment. I understand the patient has since developed ulceration on the left ankle both medial and lateral and was now referred back to the wound center as dermatology has finished the management. I do not have any notes from the dermatology department Old notes: 51 year old patient with a history of sickle cell anemia, pain bilateral lower extremities, right lower extremity ulcer and has a history of receiving a skin graft( Theraskin) several months ago. She has been visiting the wound center [facility] and was seen by Dr. Dellia Nims and Dr. Leland Johns. after prolonged conservator therapy between July 2016 and January 2017. She had been seen by the plastic surgeon and taken to the OR for debridement and application of Theraskin. She had 3 applications of Theraskin and was then treated with collagen. Prior to that she had a history of similar problems in 2014 and was treated conservatively. Had a reflux study done for the right lower extremity in August 2016 without reflux or DVT .  Past medical history significant for sickle cell disease, anemia, leg ulcers, cholelithiasis,and has never been a smoker. Once the patient was discharged on the wound center she says within 2 or 3 weeks the problems recurred and she has been treating it conservatively. since I saw her 3 weeks ago at [facility] she has been unable to get her dressing material but has completed a course of doxycycline. 6/7/ 2017 -- lower extremity venous duplex reflux evaluation was done oo No evidence of SVT or DVT in the RLL. No  venous incompetence in the RLL. No further vascular workup is indicated at this time. She was seen by Dr. [name], on 10/04/2015. She agreed with the plan of taking her to the OR for debridement and application of theraskin and would also take biopsies to rule out pyoderma gangrenosum. Follow-up note dated May 31 received and she was status post application of Theraskin to multiple ulcers around the right ankle. Pathology did not show evidence of malignancy or pyoderma gangrenosum. She would continue to see as in the wound clinic for further care and see Dr. Leland Johns as needed. The patient brought the biopsy report and it was consistent with stasis ulcer no evidence of malignancy and the comment was that there was some adjacent neovascularization, fibrosis and patchy perivascular chronic inflammation. 11/15/2015 -- today we applied her first application of Theraskin 11/30/15; TheraSkin #2 12/13/2015 -- she is having a lot of pain locally and is here for possible application of a theraskin today. 01/16/2016 -- the patient has significant pain and has noticed despite in spite of all local care and oral pain medication. It is impossible to debride her in the office. 02/06/2016 -- I do not see any notes from Dr. Iran Planas( the patient has not made a call to the office know as she heard from them) and the only visit to recently was with her PCP Dr. Danella Penton -- I saw her on 01/16/2016 and prescribed 90 tablets of oxycodone 10 mg and did lab work and screening for HIV. the HIV was negative and hemoglobin was 6.3 with a WBC count of 14.9 and hematocrit of 17.8 with platelets of 561. 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 The bimalleolar ulcers to the right lower extremity have been present for approximately 2 years, the left medial malleolus ulcer has been present  approximately 6 months. She has followed with Dr.Thimmappa in the past and has had a total of 3 applications of Theraskin (01/2015, 09/2015, 06/17/16). She has also followed with Dr. Con Memos here in the clinic and has received 2 applications of TheraSkin (11/10/15, 11/30/15). The patient does experience chronic, and is not amenable to debridement. 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 She is not currently on any antibiotic therapy and has not been treated with any recently. 07/17/2016 -- was seen by Dr. Iran Planas of plastic surgery who saw her 2 weeks postop application of Theraskin #3. She had removed her dressing and asked her to apply silver alginate on alternate days and follow-up back with the wound center. Future debridements and application of skin substitute would have to be done in the hospital due to her high risk for anesthesia. READMISSION 04/17/2021 Patient is now a 51 year old woman that we have had in this clinic for a prolonged period of time and 2016-2017 and then again for 2 visits in February  2018. At that point she had wounds on the right lower leg predominantly medial. She had also been seen by plastic surgery Dr. Leland Johns who I believe took her to the OR for operative debridement and application of TheraSkin in 2017. After she left our clinic she was followed for a very prolonged period of time in the wound care center in [facility] who then referred her ultimately to [facility] where she was seen by Dr. Vernona Rieger. Again taken her to the OR for skin grafting which apparently did not take. She had multiple other attempts at dressings although I have not really looked over all of these notes in great detail. She has not been seen in a wound care center in about a year. She states over the last year in addition to her right lower leg she has developed wounds on the left lower leg quite extensive. She is using Xeroform to all of these wounds without  really any improvement. She also has Medicaid which does not cover wound products. The patient has had vascular work-ups in the past including most recently on 03/28/2021 showing biphasic waveforms on the right triphasic at the PTA and biphasic at the dorsalis pedis on the left. She was unable to tolerate any degree of compression to do ABIs. Unfortunately TBI's were also not done. She had venous reflux studies done in 2017. This did not show any evidence of a DVT or SVT and no venous incompetence was noted in the right leg at the time this was the only side with the wound As noted I did not look all over her old records. She apparently had a course of HBO and [facility] although I am not sure what the indication would have been. In any case she developed seizures and terminated treatment earlier. She is generally much more disabled than when we last saw her in clinic. She can no longer walk pretty much wheelchair-bound because predominantly of pain in the left hip. 04/24/2021; the patient tolerated the wraps we put on. We used Santyl and Hydrofera Blue under compression. I brought her back for a nurse visit for a change in dressing. With Medicaid we will have a hard time getting anything paid for and hence the need for compression. She arrives in clinic with all the wounds looking somewhat better in terms of surface 12/20; circumferential wound on the right from the lateral to the medial. She has open areas on the left medial and left lateral x2 on all of this with the same surface. This does not look completely healthy although she does have some epithelialization. She is not complaining of a lot of pain which is unusual for her sickle ulcers. I have not looked over her extensive records from [facility]. She had recent arterial studies and has a history of venous reflux studies I will need to look these over although I do not believe she has significant arterial disease 2023 05/22/2021; patient's wound  areas measure slightly smaller. Still a lot of drainage coming from the right we have been using Hydrofera Blue and Santyl with some improvement in the wound surfaces. She tells me she will be getting transfused later in the week for her underlying sickle cell anemia I have looked over her recent arterial studies which were done in the fall. This was in November and showed biphasic and triphasic waveforms but she could not tolerate ABIs because of pressure and unfortunately TBI's were not done. She has not had recent venous reflux studies that I  can see 1/10; not much change about the same surface area. This has a yellowish surface to it very gritty. We have been using Santyl and Hydrofera Blue for a prolonged period. Culture I did last week showed methicillin sensitive staph aureus "rare". Our intake nurse reports greenish drainage which may be the Hydrofera Blue itself 1/17; wounds are continue to measure smaller although I am not sure about the accuracy here. Especially the areas on the right are covered in what looks to be a nonviable surface although she does have some epithelialization. Similarly she has areas on the left medial and left lateral ankle area which appear to have a better surface and perhaps are slightly smaller. We have been using Santyl and Hydrofera Blue. She cannot tolerate mechanical debridements She went for her reflux studies which showed significant reflux at the greater saphenous vein at the saphenofemoral junction as well as the greater saphenous vein in the proximal calf on the left she had reflux in the thigh and the common femoral vein and supra vein Fishel vein reflux in the greater saphenous vein. I will have vein and vascular look at this. My thoughts have been that these are likely sickle wounds. I looked through her old records from [facility] wound care center and then when she graduated to [facility] wound care center where she saw Dr. Zigmund Daniel and Dr.  Vernona Rieger. Although I can see she had reflux studies done I do not see that she actually saw a vein and vascular. I went over the fact that she had operative debridements and actual skin grafting that did not take. I do not think these wounds have ever really progressed towards healing Objective Constitutional Sitting or standing Blood Pressure is within target range for patient.. Pulse regular and within target range for patient.Marland Kitchen Respirations regular, non-labored and within target range.. Temperature is normal and within the target range for the patient.Marland Kitchen Appears in no distress. Vitals Time Taken: 10:50 AM, Height: 67 in, Temperature: 97.9 F, Pulse: 67 bpm, Respiratory Rate: 16 breaths/min, Blood Pressure: 122/72 mmHg. General Notes: Wound exam; almost circumferential wound of the right lower leg small bridge of skin posteriorly going over the lateral and medial ankles. The same surface which does not look completely viable although she does have some epithelialization. This does not wipe off with saline and gauze. oo On the left side she has wounds on the left medial and left lateral not quite as bad as on the right side. Again she cannot tolerate debridement even in the smaller areas. oo I do not believe she has an arterial issue Integumentary (Hair, Skin) Wound #17 status is Open. Original cause of wound was Gradually Appeared. The date acquired was: 10/05/2012. The wound has been in treatment 7 weeks. The wound is located on the Right,Circumferential Lower Leg. The wound measures 11cm length x 16.4cm width x 0.1cm depth; 141.686cm^2 area and 14.169cm^3 volume. There is Fat Layer (Subcutaneous Tissue) exposed. There is no tunneling or undermining noted. There is a large amount of purulent drainage noted. The wound margin is distinct with the outline attached to the wound base. There is medium (34-66%) pink granulation within the wound bed. There is a medium (34-66%) amount of necrotic tissue  within the wound bed including Adherent Slough. Wound #18 status is Open. Original cause of wound was Gradually Appeared. The date acquired was: 12/25/2020. The wound has been in treatment 7 weeks. The wound is located on the Left,Posterior Ankle. The wound measures 3cm  length x 1.3cm width x 0.1cm depth; 3.063cm^2 area and 0.306cm^3 volume. There is Fat Layer (Subcutaneous Tissue) exposed. There is no tunneling or undermining noted. There is a medium amount of serosanguineous drainage noted. The wound margin is distinct with the outline attached to the wound base. There is large (67-100%) pink granulation within the wound bed. There is a small (1-33%) amount of necrotic tissue within the wound bed including Adherent Slough. Wound #19 status is Open. Original cause of wound was Gradually Appeared. The date acquired was: 12/25/2020. The wound has been in treatment 7 weeks. The wound is located on the Left,Medial Ankle. The wound measures 4cm length x 2.5cm width x 0.1cm depth; 7.854cm^2 area and 0.785cm^3 volume. There is Fat Layer (Subcutaneous Tissue) exposed. There is no tunneling or undermining noted. There is a medium amount of serosanguineous drainage noted. The wound margin is distinct with the outline attached to the wound base. There is large (67-100%) pink, hyper - granulation within the wound bed. There is a small (1-33%) amount of necrotic tissue within the wound bed including Adherent Slough. Wound #20 status is Open. Original cause of wound was Gradually Appeared. The date acquired was: 12/25/2020. The wound has been in treatment 7 weeks. The wound is located on the Left,Lateral Ankle. The wound measures 0.1cm length x 0.1cm width x 0.1cm depth; 0.008cm^2 area and 0.001cm^3 volume. There is Fat Layer (Subcutaneous Tissue) exposed. There is no tunneling or undermining noted. There is a small amount of serosanguineous drainage noted. The wound margin is distinct with the outline attached to the  wound base. There is large (67-100%) pink granulation within the wound bed. There is no necrotic tissue within the wound bed. 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 - Dr. Dellia Nims Bathing/ Shower/ Hygiene: May shower with protection but do not get wound dressing(s) wet. - Can get cast protector bags at [facility] or [facility] Additional Orders / Instructions: Follow Nutritious Diet Consults ordered were: Vascular Surgeon - Non healing wounds on bilateral lower legs, abnormal reflux study 1/13 WOUND #17: - Lower Leg Wound Laterality: Right, Circumferential Cleanser: Soap and Water 2 x Per Week/30 Days Discharge Instructions: May shower and wash wound with dial antibacterial soap and water prior to dressing change. Cleanser: Wound Cleanser 2 x Per Week/30 Days Discharge Instructions: Cleanse the wound with wound cleanser prior to applying a clean dressing using gauze sponges, not tissue or cotton balls. Peri-Wound Care: Triamcinolone 15 (g) 2 x Per Week/30 Days Discharge Instructions: Use triamcinolone 15 (g) as directed Peri-Wound Care: Sween Lotion (Moisturizing lotion) 2 x Per Week/30 Days Discharge Instructions: Apply moisturizing lotion as directed Topical: Antibiotic Ointment 2 x Per Week/30 Days Prim Dressing: Hydrofera Blue Classic Foam, 4x4 in 2 x Per Week/30 Days ary Discharge Instructions: Moisten with saline prior to applying to wound bed Secondary Dressing: ABD Pad, 5x9 2 x Per Week/30 Days Discharge Instructions: Apply over primary dressing as directed. Com pression Wrap: Kerlix Roll 4.5x3.1 (in/yd) 2 x Per Week/30 Days Discharge Instructions: Apply Kerlix and Coban compression as directed. Com pression Wrap: Coban  Self-Adherent Wrap 4x5 (in/yd) 2 x Per Week/30 Days Discharge Instructions: Apply over Kerlix as directed. WOUND #18: - Ankle Wound Laterality: Left, Posterior Cleanser: Soap and Water 2 x Per Week/30 Days Discharge Instructions:  May shower and wash wound with dial antibacterial soap and water prior to dressing change. Cleanser: Wound Cleanser 2 x Per Week/30 Days Discharge Instructions: Cleanse the wound with wound cleanser prior to applying a clean dressing using gauze sponges, not tissue or cotton balls. Peri-Wound Care: Triamcinolone 15 (g) 2 x Per Week/30 Days Discharge Instructions: Use triamcinolone 15 (g) as directed Peri-Wound Care: Sween Lotion (Moisturizing lotion) 2 x Per Week/30 Days Discharge Instructions: Apply moisturizing lotion as directed Topical: Antibiotic Ointment 2 x Per Week/30 Days Prim Dressing: Hydrofera Blue Classic Foam, 4x4 in 2 x Per Week/30 Days ary Discharge Instructions: Moisten with saline prior to applying to wound bed Secondary Dressing: ABD Pad, 5x9 2 x Per Week/30 Days Discharge Instructions: Apply over primary dressing as directed. Com pression Wrap: Kerlix Roll 4.5x3.1 (in/yd) 2 x Per Week/30 Days Discharge Instructions: Apply Kerlix and Coban compression as directed. Com pression Wrap: Coban Self-Adherent Wrap 4x5 (in/yd) 2 x Per Week/30 Days Discharge Instructions: Apply over Kerlix as directed. WOUND #19: - Ankle Wound Laterality: Left, Medial Cleanser: Soap and Water 2 x Per Week/30 Days Discharge Instructions: May shower and wash wound with dial antibacterial soap and water prior to dressing change. Cleanser: Wound Cleanser 2 x Per Week/30 Days Discharge Instructions: Cleanse the wound with wound cleanser prior to applying a clean dressing using gauze sponges, not tissue or cotton balls. Peri-Wound Care: Triamcinolone 15 (g) 2 x Per Week/30 Days Discharge Instructions: Use triamcinolone 15 (g) as directed Peri-Wound Care: Sween Lotion  (Moisturizing lotion) 2 x Per Week/30 Days Discharge Instructions: Apply moisturizing lotion as directed Topical: Antibiotic Ointment 2 x Per Week/30 Days Prim Dressing: Hydrofera Blue Classic Foam, 4x4 in 2 x Per Week/30 Days ary Discharge Instructions: Moisten with saline prior to applying to wound bed Secondary Dressing: ABD Pad, 5x9 2 x Per Week/30 Days Discharge Instructions: Apply over primary dressing as directed. Com pression Wrap: Kerlix Roll 4.5x3.1 (in/yd) 2 x Per Week/30 Days Discharge Instructions: Apply Kerlix and Coban compression as directed. Com pression Wrap: Coban Self-Adherent Wrap 4x5 (in/yd) 2 x Per Week/30 Days Discharge Instructions: Apply over Kerlix as directed. WOUND #20: - Ankle Wound Laterality: Left, Lateral Cleanser: Soap and Water 2 x Per Week/30 Days Discharge Instructions: May shower and wash wound with dial antibacterial soap and water prior to dressing change. Cleanser: Wound Cleanser 2 x Per Week/30 Days Discharge Instructions: Cleanse the wound with wound cleanser prior to applying a clean dressing using gauze sponges, not tissue or cotton balls. Peri-Wound Care: Triamcinolone 15 (g) 2 x Per Week/30 Days Discharge Instructions: Use triamcinolone 15 (g) as directed Peri-Wound Care: Sween Lotion (Moisturizing lotion) 2 x Per Week/30 Days Discharge Instructions: Apply moisturizing lotion as directed Topical: Antibiotic Ointment 2 x Per Week/30 Days Prim Dressing: Hydrofera Blue Classic Foam, 4x4 in 2 x Per Week/30 Days ary Discharge Instructions: Moisten with saline prior to applying to wound bed Secondary Dressing: ABD Pad, 5x9 2 x Per Week/30 Days Discharge Instructions: Apply over primary dressing as directed. Com pression Wrap: Kerlix Roll 4.5x3.1 (in/yd) 2 x Per Week/30 Days Discharge Instructions: Apply Kerlix and Coban compression as directed. Com pression Wrap: Coban Self-Adherent Wrap 4x5 (in/yd) 2 x Per Week/30 Days Discharge Instructions:  Apply over Kerlix as directed. 1. We are continuing with Santyl and Hydrofera Blue she cannot tolerate mechanical debridement 2. Consider plastic surgery referral for operative debridement if she will agree to this 3. I looked through Dr. Vernona Rieger and Vlad's notes and Dr. Dema Severin at  the [place] wound care center before that. It is clear she did have reflux vein studies however I cannot see that she ever actually saw vascular surgery so I am going to refer her. These may be purely sickle cell wounds but I wonder whether venous hypertension is contributing 4. She does not have any infection Electronic Signature(s) Signed: 06/05/2021 4:48:32 PM By: Linton Ham MD Entered By: Linton Ham on 06/05/2021 12:34:26 -------------------------------------------------------------------------------- SuperBill Details Patient Name: Date of Service: [place], [name] 06/05/2021 Medical Record Number: [id number] Patient Account Number: [id number] Date of Birth/Sex: Treating RN: 02-27-1971 (51 y.o. [name] Primary Care Provider: [name] Other Clinician: Referring Provider: Treating Provider/Extender: Farrel Demark Weeks in Treatment: 7 Diagnosis Coding ICD-10 Codes Code Description [phone] Non-pressure chronic ulcer of other part of right lower leg with other specified severity L97.828 Non-pressure chronic ulcer of other part of left lower leg with other specified severity 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 Facility Procedures CPT4 Code: [id number] Description: [phone] - WOUND CARE VISIT-LEV 5 EST PT Modifier: Quantity: 1 Physician Procedures : CPT4 Code Description Modifier [id number] - WC PHYS LEVEL 4 - EST PT ICD-10 Diagnosis Description L97.818 Non-pressure chronic ulcer of other part of right lower leg with other specified severity L97.828 Non-pressure chronic ulcer of other part of  left lower leg with other specified severity D57.1 Sickle-cell  disease without crisis Quantity: 1 Electronic Signature(s) Signed: 06/11/2021 5:11:23 PM By: Levan Hurst RN, BSN Signed: 06/12/2021 4:35:26 PM By: Linton Ham MD Previous Signature: 06/05/2021 4:48:32 PM Version By: Linton Ham MD Entered By: Levan Hurst on 06/11/2021 07:47:21

## 2021-06-06 NOTE — Progress Notes (Addendum)
JERSI, MCMASTER ([id number]) Visit Report for 06/05/2021 Arrival Information Details Patient Name: Date of Service: [name] 06/05/2021 10:45 A M Medical Record Number: [id number] Patient Account Number: [id number] Date of Birth/Sex: Treating RN: January 02, 1971 (51 y.o. [name] Primary Care Jeren Dufrane: [name] Other Clinician: Referring Chevonne Bostrom: Treating Kerri Asche/Extender: Bernette Redbird in Treatment: 7 Visit Information History Since Last Visit Added or deleted any medications: No Patient Arrived: Wheel Chair Any new allergies or adverse reactions: No Arrival Time: 10:50 Had a fall or experienced change in No Accompanied By: self activities of daily living that may affect Transfer Assistance: None risk of falls: Patient Identification Verified: Yes Signs or symptoms of abuse/neglect since last visito No Secondary Verification Process Completed: Yes Hospitalized since last visit: No Patient Requires Transmission-Based Precautions: No Implantable device outside of the clinic excluding No Patient Has Alerts: No cellular tissue based products placed in the center since last visit: Has Dressing in Place as Prescribed: Yes Pain Present Now: Yes Electronic Signature(s) Signed: 06/06/2021 4:19:33 PM By: Sandre Kitty Entered By: Sandre Kitty on 06/05/2021 10:50:46 -------------------------------------------------------------------------------- Clinic Level of Care Assessment Details Patient Name: Date of Service: [name] 06/05/2021 10:45 A M Medical Record Number: [id number] Patient Account Number: [id number] Date of Birth/Sex: Treating RN: 09-28-1970 (51 y.o. [name] Primary Care Kirsten Mckone: [name] Other Clinician: Referring Ahmar Pickrell: Treating Danaye Sobh/Extender: Bernette Redbird in Treatment: 7 Clinic Level of Care Assessment Items TOOL 4 Quantity Score X- 1 0 Use when only an  EandM is performed on FOLLOW-UP visit ASSESSMENTS - Nursing Assessment / Reassessment X- 1 10 Reassessment of Co-morbidities (includes updates in patient status) X- 1 5 Reassessment of Adherence to Treatment Plan ASSESSMENTS - Wound and Skin A ssessment / Reassessment []  - 0 Simple Wound Assessment / Reassessment - one wound X- 4 5 Complex Wound Assessment / Reassessment - multiple wounds []  - 0 Dermatologic / Skin Assessment (not related to wound area) ASSESSMENTS - Focused Assessment []  - 0 Circumferential Edema Measurements - multi extremities []  - 0 Nutritional Assessment / Counseling / Intervention X- 1 5 Lower Extremity Assessment (monofilament, tuning fork, pulses) []  - 0 Peripheral Arterial Disease Assessment (using hand held doppler) ASSESSMENTS - Ostomy and/or Continence Assessment and Care []  - 0 Incontinence Assessment and Management []  - 0 Ostomy Care Assessment and Management (repouching, etc.) PROCESS - Coordination of Care X - Simple Patient / Family Education for ongoing care 1 15 []  - 0 Complex (extensive) Patient / Family Education for ongoing care X- 1 10 Staff obtains Programmer, systems, Records, T Results / Process Orders est []  - 0 Staff telephones HHA, Nursing Homes / Clarify orders / etc []  - 0 Routine Transfer to another Facility (non-emergent condition) []  - 0 Routine Hospital Admission (non-emergent condition) []  - 0 New Admissions / Biomedical engineer / Ordering NPWT Apligraf, etc. , []  - 0 Emergency Hospital Admission (emergent condition) X- 1 10 Simple Discharge Coordination []  - 0 Complex (extensive) Discharge Coordination PROCESS - Special Needs []  - 0 Pediatric / Minor Patient Management []  - 0 Isolation Patient Management []  - 0 Hearing / Language / Visual special needs []  - 0 Assessment of Community assistance (transportation, D/C planning, etc.) []  - 0 Additional assistance / Altered mentation []  - 0 Support Surface(s)  Assessment (bed, cushion, seat, etc.) INTERVENTIONS - Wound Cleansing / Measurement []  - 0 Simple Wound Cleansing - one wound X- 4 5 Complex Wound Cleansing - multiple wounds  X- 1 5 Wound Imaging (photographs - any number of wounds) []  - 0 Wound Tracing (instead of photographs) []  - 0 Simple Wound Measurement - one wound X- 4 5 Complex Wound Measurement - multiple wounds INTERVENTIONS - Wound Dressings []  - 0 Small Wound Dressing one or multiple wounds []  - 0 Medium Wound Dressing one or multiple wounds X- 2 20 Large Wound Dressing one or multiple wounds []  - 0 Application of Medications - topical []  - 0 Application of Medications - injection INTERVENTIONS - Miscellaneous []  - 0 External ear exam []  - 0 Specimen Collection (cultures, biopsies, blood, body fluids, etc.) []  - 0 Specimen(s) / Culture(s) sent or taken to Lab for analysis []  - 0 Patient Transfer (multiple staff / Civil Service fast streamer / Similar devices) []  - 0 Simple Staple / Suture removal (25 or less) []  - 0 Complex Staple / Suture removal (26 or more) []  - 0 Hypo / Hyperglycemic Management (close monitor of Blood Glucose) []  - 0 Ankle / Brachial Index ([name]) - do not check if billed separately X- 1 5 Vital Signs Has the patient been seen at the hospital within the last three years: Yes Total Score: 165 Level Of Care: New/Established - Level 5 Electronic Signature(s) Signed: 06/11/2021 5:11:23 PM By: Levan Hurst RN, BSN Entered By: Levan Hurst on 06/11/2021 07:47:14 -------------------------------------------------------------------------------- Encounter Discharge Information Details Patient Name: Date of Service: [name], [name] 06/05/2021 10:45 A M Medical Record Number: [id number] Patient Account Number: [id number] Date of Birth/Sex: Treating RN: 11/28/1970 (51 y.o. [name] Primary Care Ronisha Herringshaw: [name] Other Clinician: Referring Stina Gane: Treating Maddie Brazier/Extender: Bernette Redbird in Treatment: 7 Encounter Discharge Information Items Discharge Condition: Stable Ambulatory Status: Wheelchair Discharge Destination: Home Transportation: Private Auto Accompanied By: alone Schedule Follow-up Appointment: Yes Clinical Summary of Care: Patient Declined Electronic Signature(s) Signed: 06/11/2021 5:11:23 PM By: Levan Hurst RN, BSN Entered By: Levan Hurst on 06/11/2021 07:48:12 -------------------------------------------------------------------------------- Lower Extremity Assessment Details Patient Name: Date of Service: [name], [name] 06/05/2021 10:45 A M Medical Record Number: [id number] Patient Account Number: [id number] Date of Birth/Sex: Treating RN: March 15, 1971 (51 y.o. [name] Primary Care Garnell Phenix: [name] Other Clinician: Referring Luddie Boghosian: Treating Tamlyn Sides/Extender: Farrel Demark Weeks in Treatment: 7 Edema Assessment Assessed: [Left: No] [Right: No] Edema: [Left: No] [Right: No] Calf Left: Right: Point of Measurement: 33 cm From Medial Instep 24.6 cm 27 cm Ankle Left: Right: Point of Measurement: 10 cm From Medial Instep 17 cm 20 cm Vascular Assessment Pulses: Dorsalis Pedis Palpable: [Left:Yes] [Right:Yes] Electronic Signature(s) Signed: 06/06/2021 5:10:09 PM By: Levan Hurst RN, BSN Entered By: Levan Hurst on 06/05/2021 11:43:13 -------------------------------------------------------------------------------- Multi Wound Chart Details Patient Name: Date of Service: [name], [name] 06/05/2021 10:45 A M Medical Record Number: [id number] Patient Account Number: [id number] Date of Birth/Sex: Treating RN: 06-19-1970 (52 y.o. [name] Primary Care Davan Nawabi: [name] Other Clinician: Referring Said Rueb: Treating Miniya Miguez/Extender: Farrel Demark Weeks in Treatment: 7 Vital Signs Height(in): 67 Pulse(bpm): 67 Weight(lbs): Blood  Pressure(mmHg): 122/72 Body Mass Index(BMI): Temperature(F): 97.9 Respiratory Rate(breaths/min): 16 Photos: [17:No Photos Right, Circumferential Lower Leg] [18:No Photos Left, Posterior Ankle] [19:No Photos Left, Medial Ankle] Wound Location: [17:Gradually Appeared] [18:Gradually Appeared] [19:Gradually Appeared] Wounding Event: [17:Sickle Cell Lesion] [18:Sickle Cell Lesion] [19:Sickle Cell Lesion] Primary Etiology: [17:Anemia, Sickle Cell Disease,] [18:Anemia, Sickle Cell Disease,] [19:Anemia, Sickle Cell Disease,] Comorbid History: [17:Neuropathy 10/05/2012] [18:Neuropathy 12/25/2020] [19:Neuropathy 12/25/2020] Date Acquired: [17:7] [18:7] [19:7] Weeks of  Treatment: [17:Open] [18:Open] [19:Open] Wound Status: [17:Yes] [18:No] [19:No] Clustered Wound: [17:11x16.4x0.1] [18:3x1.3x0.1] [19:4x2.5x0.1] Measurements L x W x D (cm) [17:141.686] [18:3.063] [19:7.854] A (cm) : rea [17:14.169] [18:0.306] [19:0.785] Volume (cm) : [17:24.60%] [18:78.30%] [19:63.00%] % Reduction in Area: [17:24.60%] [18:92.80%] [19:94.70%] % Reduction in Volume: [17:Full Thickness Without Exposed] [18:Full Thickness Without Exposed] [19:Full Thickness Without Exposed] Classification: [17:Support Structures Large] [18:Support Structures Medium] [19:Support Structures Medium] Exudate Amount: [17:Purulent] [18:Serosanguineous] [19:Serosanguineous] Exudate Type: [17:yellow, brown, green] [18:red, brown] [19:red, brown] Exudate Color: [17:Distinct, outline attached] [18:Distinct, outline attached] [19:Distinct, outline attached] Wound Margin: [17:Medium (34-66%)] [18:Large (67-100%)] [19:Large (67-100%)] Granulation Amount: [17:Pink] [18:Pink] [19:Pink, Hyper-granulation] Granulation Quality: [17:Medium (34-66%)] [18:Small (1-33%)] [19:Small (1-33%)] Necrotic Amount: [17:Fat Layer (Subcutaneous Tissue): Yes Fat Layer (Subcutaneous Tissue): Yes Fat Layer (Subcutaneous Tissue): Yes] Exposed Structures: [17:Fascia: No  Tendon: No Muscle: No Joint: No Bone: No Small (1-33%)] [18:Fascia: No Tendon: No Muscle: No Joint: No Bone: No Medium (34-66%)] [19:Fascia: No Tendon: No Muscle: No Joint: No Bone: No Medium (34-66%)] Wound Number: 20 N/A N/A Photos: No Photos N/A N/A Left, Lateral Ankle N/A N/A Wound Location: Gradually Appeared N/A N/A Wounding Event: Sickle Cell Lesion N/A N/A Primary Etiology: Anemia, Sickle Cell Disease, N/A N/A Comorbid History: Neuropathy 12/25/2020 N/A N/A Date Acquired: 7 N/A N/A Weeks of Treatment: Open N/A N/A Wound Status: No N/A N/A Clustered Wound: 0.1x0.1x0.1 N/A N/A Measurements L x W x D (cm) 0.008 N/A N/A A (cm) : rea 0.001 N/A N/A Volume (cm) : 99.70% N/A N/A % Reduction in Area: 99.60% N/A N/A % Reduction in Volume: Full Thickness Without Exposed N/A N/A Classification: Support Structures Small N/A N/A Exudate Amount: Serosanguineous N/A N/A Exudate Type: red, brown N/A N/A Exudate Color: Distinct, outline attached N/A N/A Wound Margin: Large (67-100%) N/A N/A Granulation Amount: Pink N/A N/A Granulation Quality: None Present (0%) N/A N/A Necrotic Amount: Fat Layer (Subcutaneous Tissue): Yes N/A N/A Exposed Structures: Fascia: No Tendon: No Muscle: No Joint: No Bone: No Large (67-100%) N/A N/A Epithelialization: Treatment Notes Electronic Signature(s) Signed: 06/05/2021 4:48:32 PM By: Linton Ham MD Signed: 06/06/2021 5:10:09 PM By: Levan Hurst RN, BSN Entered By: Linton Ham on 06/05/2021 12:25:56 -------------------------------------------------------------------------------- Pain Assessment Details Patient Name: Date of Service: [name], [name] 06/05/2021 10:45 A M Medical Record Number: [id number] Patient Account Number: [id number] Date of Birth/Sex: Treating RN: 1970/08/24 (51 y.o. [name] Primary Care Shenea Giacobbe: [name] Other Clinician: Referring Sina Lucchesi: Treating Eulalio Reamy/Extender: Farrel Demark Weeks in Treatment: 7 Active Problems Location of Pain Severity and Description of Pain Patient Has Paino Yes Site Locations Rate the pain. Current Pain Level: 3 Pain Management and Medication Current Pain Management: Electronic Signature(s) Signed: 06/06/2021 4:19:33 PM By: Sandre Kitty Signed: 06/06/2021 5:10:09 PM By: Levan Hurst RN, BSN Entered By: Sandre Kitty on 06/05/2021 10:51:11 -------------------------------------------------------------------------------- Wound Assessment Details Patient Name: Date of Service: [name], [name] 06/05/2021 10:45 A M Medical Record Number: [id number] Patient Account Number: [id number] Date of Birth/Sex: Treating RN: 04-20-1971 (51 y.o. [name] Primary Care Shavana Calder: [name] Other Clinician: Referring Ole Lafon: Treating Bobbi Yount/Extender: Farrel Demark Weeks in Treatment: 7 Wound Status Wound Number: 17 Primary Etiology: Sickle Cell Lesion Wound Location: Right, Circumferential Lower Leg Wound Status: Open Wounding Event: Gradually Appeared Comorbid History: Anemia, Sickle Cell Disease, Neuropathy Date Acquired: 10/05/2012 Weeks Of Treatment: 7 Clustered Wound: Yes Photos Photo Uploaded By: Donavan Burnet on 06/11/2021 16:43:11 Wound Measurements Length: (cm) 11 Width: (cm) 16.4 Depth: (cm) 0.1  Area: (cm) 141.686 Volume: (cm) 14.169 % Reduction in Area: 24.6% % Reduction in Volume: 24.6% Epithelialization: Small (1-33%) Tunneling: No Undermining: No Wound Description Classification: Full Thickness Without Exposed Support Structures Wound Margin: Distinct, outline attached Exudate Amount: Large Exudate Type: Purulent Exudate Color: yellow, brown, green Foul Odor After Cleansing: No Slough/Fibrino Yes Wound Bed Granulation Amount: Medium (34-66%) Exposed Structure Granulation Quality: Pink Fascia Exposed: No Necrotic Amount: Medium  (34-66%) Fat Layer (Subcutaneous Tissue) Exposed: Yes Necrotic Quality: Adherent Slough Tendon Exposed: No Muscle Exposed: No Joint Exposed: No Bone Exposed: No Treatment Notes Wound #17 (Lower Leg) Wound Laterality: Right, Circumferential Cleanser Soap and Water Discharge Instruction: May shower and wash wound with dial antibacterial soap and water prior to dressing change. Wound Cleanser Discharge Instruction: Cleanse the wound with wound cleanser prior to applying a clean dressing using gauze sponges, not tissue or cotton balls. Peri-Wound Care Triamcinolone 15 (g) Discharge Instruction: Use triamcinolone 15 (g) as directed Sween Lotion (Moisturizing lotion) Discharge Instruction: Apply moisturizing lotion as directed Topical Antibiotic Ointment Primary Dressing Hydrofera Blue Classic Foam, 4x4 in Discharge Instruction: Moisten with saline prior to applying to wound bed Secondary Dressing ABD Pad, 5x9 Discharge Instruction: Apply over primary dressing as directed. Secured With Compression Wrap Kerlix Roll 4.5x3.1 (in/yd) Discharge Instruction: Apply Kerlix and Coban compression as directed. Coban Self-Adherent Wrap 4x5 (in/yd) Discharge Instruction: Apply over Kerlix as directed. Compression Stockings Add-Ons Electronic Signature(s) Signed: 06/06/2021 5:10:09 PM By: Levan Hurst RN, BSN Entered By: Levan Hurst on 06/05/2021 11:35:55 -------------------------------------------------------------------------------- Wound Assessment Details Patient Name: Date of Service: [name], [name] 06/05/2021 10:45 A M Medical Record Number: [id number] Patient Account Number: [id number] Date of Birth/Sex: Treating RN: 08-26-1970 (51 y.o. [name] Primary Care Delroy Ordway: [name] Other Clinician: Referring Bogdan Vivona: Treating Ande Therrell/Extender: Farrel Demark Weeks in Treatment: 7 Wound Status Wound Number: 18 Primary Etiology: Sickle Cell  Lesion Wound Location: Left, Posterior Ankle Wound Status: Open Wounding Event: Gradually Appeared Comorbid History: Anemia, Sickle Cell Disease, Neuropathy Date Acquired: 12/25/2020 Weeks Of Treatment: 7 Clustered Wound: No Photos Photo Uploaded By: Donavan Burnet on 06/11/2021 16:43:11 Wound Measurements Length: (cm) 3 Width: (cm) 1.3 Depth: (cm) 0.1 Area: (cm) 3.063 Volume: (cm) 0.306 % Reduction in Area: 78.3% % Reduction in Volume: 92.8% Epithelialization: Medium (34-66%) Tunneling: No Undermining: No Wound Description Classification: Full Thickness Without Exposed Support Structures Wound Margin: Distinct, outline attached Exudate Amount: Medium Exudate Type: Serosanguineous Exudate Color: red, brown Foul Odor After Cleansing: No Slough/Fibrino Yes Wound Bed Granulation Amount: Large (67-100%) Exposed Structure Granulation Quality: Pink Fascia Exposed: No Necrotic Amount: Small (1-33%) Fat Layer (Subcutaneous Tissue) Exposed: Yes Necrotic Quality: Adherent Slough Tendon Exposed: No Muscle Exposed: No Joint Exposed: No Bone Exposed: No Treatment Notes Wound #18 (Ankle) Wound Laterality: Left, Posterior Cleanser Soap and Water Discharge Instruction: May shower and wash wound with dial antibacterial soap and water prior to dressing change. Wound Cleanser Discharge Instruction: Cleanse the wound with wound cleanser prior to applying a clean dressing using gauze sponges, not tissue or cotton balls. Peri-Wound Care Triamcinolone 15 (g) Discharge Instruction: Use triamcinolone 15 (g) as directed Sween Lotion (Moisturizing lotion) Discharge Instruction: Apply moisturizing lotion as directed Topical Antibiotic Ointment Primary Dressing Hydrofera Blue Classic Foam, 4x4 in Discharge Instruction: Moisten with saline prior to applying to wound bed Secondary Dressing ABD Pad, 5x9 Discharge Instruction: Apply over primary dressing as directed. Secured  With Compression Wrap Kerlix Roll 4.5x3.1 (in/yd) Discharge Instruction: Apply Kerlix and Coban compression as  directed. Coban Self-Adherent Wrap 4x5 (in/yd) Discharge Instruction: Apply over Kerlix as directed. Compression Stockings Add-Ons Electronic Signature(s) Signed: 06/06/2021 5:10:09 PM By: Levan Hurst RN, BSN Entered By: Levan Hurst on 06/05/2021 11:36:22 -------------------------------------------------------------------------------- Wound Assessment Details Patient Name: Date of Service: [name], [name] 06/05/2021 10:45 A M Medical Record Number: [id number] Patient Account Number: [id number] Date of Birth/Sex: Treating RN: Aug 07, 1970 (51 y.o. [name] Primary Care Romeka Scifres: [name] Other Clinician: Referring Tynan Boesel: Treating Amrie Gurganus/Extender: Farrel Demark Weeks in Treatment: 7 Wound Status Wound Number: 19 Primary Etiology: Sickle Cell Lesion Wound Location: Left, Medial Ankle Wound Status: Open Wounding Event: Gradually Appeared Comorbid History: Anemia, Sickle Cell Disease, Neuropathy Date Acquired: 12/25/2020 Weeks Of Treatment: 7 Clustered Wound: No Photos Photo Uploaded By: Donavan Burnet on 06/11/2021 16:43:32 Wound Measurements Length: (cm) 4 Width: (cm) 2.5 Depth: (cm) 0.1 Area: (cm) 7.854 Volume: (cm) 0.785 % Reduction in Area: 63% % Reduction in Volume: 94.7% Epithelialization: Medium (34-66%) Tunneling: No Undermining: No Wound Description Classification: Full Thickness Without Exposed Support Structures Wound Margin: Distinct, outline attached Exudate Amount: Medium Exudate Type: Serosanguineous Exudate Color: red, brown Foul Odor After Cleansing: No Slough/Fibrino Yes Wound Bed Granulation Amount: Large (67-100%) Exposed Structure Granulation Quality: Pink, Hyper-granulation Fascia Exposed: No Necrotic Amount: Small (1-33%) Fat Layer (Subcutaneous Tissue) Exposed: Yes Necrotic  Quality: Adherent Slough Tendon Exposed: No Muscle Exposed: No Joint Exposed: No Bone Exposed: No Treatment Notes Wound #19 (Ankle) Wound Laterality: Left, Medial Cleanser Soap and Water Discharge Instruction: May shower and wash wound with dial antibacterial soap and water prior to dressing change. Wound Cleanser Discharge Instruction: Cleanse the wound with wound cleanser prior to applying a clean dressing using gauze sponges, not tissue or cotton balls. Peri-Wound Care Triamcinolone 15 (g) Discharge Instruction: Use triamcinolone 15 (g) as directed Sween Lotion (Moisturizing lotion) Discharge Instruction: Apply moisturizing lotion as directed Topical Antibiotic Ointment Primary Dressing Hydrofera Blue Classic Foam, 4x4 in Discharge Instruction: Moisten with saline prior to applying to wound bed Secondary Dressing ABD Pad, 5x9 Discharge Instruction: Apply over primary dressing as directed. Secured With Compression Wrap Kerlix Roll 4.5x3.1 (in/yd) Discharge Instruction: Apply Kerlix and Coban compression as directed. Coban Self-Adherent Wrap 4x5 (in/yd) Discharge Instruction: Apply over Kerlix as directed. Compression Stockings Add-Ons Electronic Signature(s) Signed: 06/06/2021 5:10:09 PM By: Levan Hurst RN, BSN Entered By: Levan Hurst on 06/05/2021 11:37:08 -------------------------------------------------------------------------------- Wound Assessment Details Patient Name: Date of Service: [name], [name] 06/05/2021 10:45 A M Medical Record Number: [id number] Patient Account Number: [id number] Date of Birth/Sex: Treating RN: 09/19/1970 (51 y.o. [name] Primary Care Kadijah Shamoon: [name] Other Clinician: Referring Keldrick Pomplun: Treating Payge Eppes/Extender: Farrel Demark Weeks in Treatment: 7 Wound Status Wound Number: 20 Primary Etiology: Sickle Cell Lesion Wound Location: Left, Lateral Ankle Wound Status: Open Wounding Event:  Gradually Appeared Comorbid History: Anemia, Sickle Cell Disease, Neuropathy Date Acquired: 12/25/2020 Weeks Of Treatment: 7 Clustered Wound: No Photos Photo Uploaded By: Donavan Burnet on 06/11/2021 16:43:32 Wound Measurements Length: (cm) 0.1 Width: (cm) 0.1 Depth: (cm) 0.1 Area: (cm) 0.008 Volume: (cm) 0.001 % Reduction in Area: 99.7% % Reduction in Volume: 99.6% Epithelialization: Large (67-100%) Tunneling: No Undermining: No Wound Description Classification: Full Thickness Without Exposed Support Structures Wound Margin: Distinct, outline attached Exudate Amount: Small Exudate Type: Serosanguineous Exudate Color: red, brown Foul Odor After Cleansing: No Slough/Fibrino No Wound Bed Granulation Amount: Large (67-100%) Exposed Structure Granulation Quality: Pink Fascia Exposed: No Necrotic Amount: None Present (0%) Fat Layer (Subcutaneous  Tissue) Exposed: Yes Tendon Exposed: No Muscle Exposed: No Joint Exposed: No Bone Exposed: No Treatment Notes Wound #20 (Ankle) Wound Laterality: Left, Lateral Cleanser Soap and Water Discharge Instruction: May shower and wash wound with dial antibacterial soap and water prior to dressing change. Wound Cleanser Discharge Instruction: Cleanse the wound with wound cleanser prior to applying a clean dressing using gauze sponges, not tissue or cotton balls. Peri-Wound Care Triamcinolone 15 (g) Discharge Instruction: Use triamcinolone 15 (g) as directed Sween Lotion (Moisturizing lotion) Discharge Instruction: Apply moisturizing lotion as directed Topical Antibiotic Ointment Primary Dressing Hydrofera Blue Classic Foam, 4x4 in Discharge Instruction: Moisten with saline prior to applying to wound bed Secondary Dressing ABD Pad, 5x9 Discharge Instruction: Apply over primary dressing as directed. Secured With Compression Wrap Kerlix Roll 4.5x3.1 (in/yd) Discharge Instruction: Apply Kerlix and Coban compression as  directed. Coban Self-Adherent Wrap 4x5 (in/yd) Discharge Instruction: Apply over Kerlix as directed. Compression Stockings Add-Ons Electronic Signature(s) Signed: 06/06/2021 5:10:09 PM By: Levan Hurst RN, BSN Entered By: Levan Hurst on 06/05/2021 11:37:40 -------------------------------------------------------------------------------- Vitals Details Patient Name: Date of Service: [name], [name] 06/05/2021 10:45 A M Medical Record Number: [id number] Patient Account Number: [id number] Date of Birth/Sex: Treating RN: 1970/10/19 (50 y.o. [name] Primary Care Melquan Ernsberger: [name] Other Clinician: Referring Sahaana Weitman: Treating Abdalla Naramore/Extender: Farrel Demark Weeks in Treatment: 7 Vital Signs Time Taken: 10:50 Temperature (F): 97.9 Height (in): 67 Pulse (bpm): 67 Respiratory Rate (breaths/min): 16 Blood Pressure (mmHg): 122/72 Reference Range: 80 - 120 mg / dl Electronic Signature(s) Signed: 06/06/2021 4:19:33 PM By: Sandre Kitty Entered By: Sandre Kitty on 06/05/2021 10:51:04

## 2021-06-07 NOTE — Progress Notes (Signed)
[name], [name] ([id number]) Visit Report for 05/08/2021 Arrival Information Details Patient Name: Date of Service: [name] 05/08/2021 10:15 A M Medical Record Number: [id number] Patient Account Number: [id number] Date of Birth/Sex: Treating RN: Feb 02, 1971 (51 y.o. [name] Primary Care Rilyn Upshaw: [name] Other Clinician: Referring Rexton Greulich: Treating Letzy Gullickson/Extender: Bernette Redbird in Treatment: 3 Visit Information History Since Last Visit Added or deleted any medications: No Patient Arrived: Wheel Chair Any new allergies or adverse reactions: No Arrival Time: 10:03 Had a fall or experienced change in No Accompanied By: self activities of daily living that may affect Transfer Assistance: None risk of falls: Patient Requires Transmission-Based Precautions: No Signs or symptoms of abuse/neglect since last visito No Patient Has Alerts: No Hospitalized since last visit: No Implantable device outside of the clinic excluding No cellular tissue based products placed in the center since last visit: Has Dressing in Place as Prescribed: Yes Has Compression in Place as Prescribed: Yes Pain Present Now: Yes Electronic Signature(s) Signed: 05/08/2021 5:17:23 PM By: Dellie Catholic RN Entered By: Dellie Catholic on 05/08/2021 10:03:54 -------------------------------------------------------------------------------- Clinic Level of Care Assessment Details Patient Name: Date of Service: [name] 05/08/2021 10:15 A M Medical Record Number: [id number] Patient Account Number: [id number] Date of Birth/Sex: Treating RN: 1971/03/08 (51 y.o. Tonita Phoenix, Lauren Primary Care Niaja Stickley: [name] Other Clinician: Referring Emory Leaver: Treating Panayiota Larkin/Extender: Bernette Redbird in Treatment: 3 Clinic Level of Care Assessment Items TOOL 4 Quantity Score X- 1 0 Use when only an EandM is performed on FOLLOW-UP  visit ASSESSMENTS - Nursing Assessment / Reassessment X- 1 10 Reassessment of Co-morbidities (includes updates in patient status) X- 1 5 Reassessment of Adherence to Treatment Plan ASSESSMENTS - Wound and Skin A ssessment / Reassessment []  - 0 Simple Wound Assessment / Reassessment - one wound X- 4 5 Complex Wound Assessment / Reassessment - multiple wounds []  - 0 Dermatologic / Skin Assessment (not related to wound area) ASSESSMENTS - Focused Assessment X- 2 5 Circumferential Edema Measurements - multi extremities []  - 0 Nutritional Assessment / Counseling / Intervention []  - 0 Lower Extremity Assessment (monofilament, tuning fork, pulses) []  - 0 Peripheral Arterial Disease Assessment (using hand held doppler) ASSESSMENTS - Ostomy and/or Continence Assessment and Care []  - 0 Incontinence Assessment and Management []  - 0 Ostomy Care Assessment and Management (repouching, etc.) PROCESS - Coordination of Care []  - 0 Simple Patient / Family Education for ongoing care X- 1 20 Complex (extensive) Patient / Family Education for ongoing care X- 1 10 Staff obtains Programmer, systems, Records, T Results / Process Orders est X- 1 10 Staff telephones HHA, Nursing Homes / Clarify orders / etc []  - 0 Routine Transfer to another Facility (non-emergent condition) []  - 0 Routine Hospital Admission (non-emergent condition) []  - 0 New Admissions / Biomedical engineer / Ordering NPWT Apligraf, etc. , []  - 0 Emergency Hospital Admission (emergent condition) []  - 0 Simple Discharge Coordination X- 1 15 Complex (extensive) Discharge Coordination PROCESS - Special Needs []  - 0 Pediatric / Minor Patient Management []  - 0 Isolation Patient Management []  - 0 Hearing / Language / Visual special needs []  - 0 Assessment of Community assistance (transportation, D/C planning, etc.) []  - 0 Additional assistance / Altered mentation []  - 0 Support Surface(s) Assessment (bed, cushion, seat,  etc.) INTERVENTIONS - Wound Cleansing / Measurement []  - 0 Simple Wound Cleansing - one wound X- 4 5 Complex Wound Cleansing - multiple wounds X- 1  5 Wound Imaging (photographs - any number of wounds) []  - 0 Wound Tracing (instead of photographs) []  - 0 Simple Wound Measurement - one wound X- 4 5 Complex Wound Measurement - multiple wounds INTERVENTIONS - Wound Dressings []  - 0 Small Wound Dressing one or multiple wounds X- 1 15 Medium Wound Dressing one or multiple wounds []  - 0 Large Wound Dressing one or multiple wounds X- 1 5 Application of Medications - topical []  - 0 Application of Medications - injection INTERVENTIONS - Miscellaneous []  - 0 External ear exam []  - 0 Specimen Collection (cultures, biopsies, blood, body fluids, etc.) []  - 0 Specimen(s) / Culture(s) sent or taken to Lab for analysis []  - 0 Patient Transfer (multiple staff / Harrel Lemon Lift / Similar devices) []  - 0 Simple Staple / Suture removal (25 or less) []  - 0 Complex Staple / Suture removal (26 or more) []  - 0 Hypo / Hyperglycemic Management (close monitor of Blood Glucose) []  - 0 Ankle / Brachial Index ([name]) - do not check if billed separately X- 1 5 Vital Signs Has the patient been seen at the hospital within the last three years: Yes Total Score: 170 Level Of Care: New/Established - Level 5 Electronic Signature(s) Signed: 06/07/2021 12:37:40 PM By: Rhae Hammock RN Entered By: Rhae Hammock on 05/08/2021 17:15:45 -------------------------------------------------------------------------------- Encounter Discharge Information Details Patient Name: Date of Service: [name], [name] 05/08/2021 10:15 A M Medical Record Number: [id number] Patient Account Number: [id number] Date of Birth/Sex: Treating RN: 1971-02-25 (51 y.o. [name] Primary Care Jamale Spangler: [name] Other Clinician: Referring Blane Worthington: Treating Jermon Chalfant/Extender: Bernette Redbird  in Treatment: 3 Encounter Discharge Information Items Discharge Condition: Stable Ambulatory Status: Wheelchair Discharge Destination: Home Transportation: Private Auto Accompanied By: self Schedule Follow-up Appointment: Yes Clinical Summary of Care: Patient Declined Electronic Signature(s) Signed: 05/08/2021 5:17:23 PM By: Dellie Catholic RN Entered By: Dellie Catholic on 05/08/2021 16:57:37 -------------------------------------------------------------------------------- Lower Extremity Assessment Details Patient Name: Date of Service: [name] 05/08/2021 10:15 A M Medical Record Number: [id number] Patient Account Number: [id number] Date of Birth/Sex: Treating RN: Jan 08, 1971 (51 y.o. [name] Primary Care Thersia Petraglia: [name] Other Clinician: Referring Brad Mcgaughy: Treating Divina Neale/Extender: Farrel Demark Weeks in Treatment: 3 Edema Assessment Assessed: [Left: No] [Right: No] Edema: [Left: No] [Right: No] Calf Left: Right: Point of Measurement: 33 cm From Medial Instep 24 cm 28 cm Ankle Left: Right: Point of Measurement: 10 cm From Medial Instep 18 cm 21 cm Electronic Signature(s) Signed: 05/08/2021 5:17:23 PM By: Dellie Catholic RN Entered By: Dellie Catholic on 05/08/2021 10:19:28 -------------------------------------------------------------------------------- Multi Wound Chart Details Patient Name: Date of Service: [name], [name] 05/08/2021 10:15 A M Medical Record Number: [id number] Patient Account Number: [id number] Date of Birth/Sex: Treating RN: Apr 25, 1971 (51 y.o. [name] Primary Care Ilyanna Baillargeon: [name] Other Clinician: Referring Canary Fister: Treating Norleen Xie/Extender: Farrel Demark Weeks in Treatment: 3 Vital Signs Height(in): 67 Pulse(bpm): 46 Weight(lbs): Blood Pressure(mmHg): 128/71 Body Mass Index(BMI): Temperature(F): 98 Respiratory Rate(breaths/min):  20 Photos: Right, Circumferential Lower Leg Left, Posterior Ankle Left, Medial Ankle Wound Location: Gradually Appeared Gradually Appeared Gradually Appeared Wounding Event: Sickle Cell Lesion Sickle Cell Lesion Sickle Cell Lesion Primary Etiology: Anemia, Sickle Cell Disease, Anemia, Sickle Cell Disease, Anemia, Sickle Cell Disease, Comorbid History: Neuropathy Neuropathy Neuropathy 10/05/2012 12/25/2020 12/25/2020 Date Acquired: 3 3 3  Weeks of Treatment: Open Open Open Wound Status: Yes No No Clustered Wound: 12.5x16.5x0.3 4.9x1.9x0.2 5x2.9x0.3 Measurements L x W x  D (cm) 161.988 7.312 11.388 A (cm) : rea 48.597 1.462 3.416 Volume (cm) : 13.80% 48.30% 46.30% % Reduction in Area: -158.60% 65.50% 77.00% % Reduction in Volume: Full Thickness Without Exposed Full Thickness Without Exposed Full Thickness Without Exposed Classification: Support Structures Support Structures Support Structures Large Medium Medium Exudate Amount: Serosanguineous Serosanguineous Serosanguineous Exudate Type: red, brown red, brown red, brown Exudate Color: Large (67-100%) Large (67-100%) Large (67-100%) Granulation Amount: Pink Pink Pink Granulation Quality: Small (1-33%) Small (1-33%) Small (1-33%) Necrotic Amount: Fat Layer (Subcutaneous Tissue): Yes Fat Layer (Subcutaneous Tissue): Yes Fat Layer (Subcutaneous Tissue): Yes Exposed Structures: Fascia: No Fascia: No Fascia: No Tendon: No Tendon: No Tendon: No Muscle: No Muscle: No Muscle: No Joint: No Joint: No Joint: No Bone: No Bone: No Bone: No Small (1-33%) Small (1-33%) Small (1-33%) Epithelialization: Wound Number: 20 N/A N/A Photos: N/A N/A Left, Lateral Ankle N/A N/A Wound Location: Gradually Appeared N/A N/A Wounding Event: Sickle Cell Lesion N/A N/A Primary Etiology: Anemia, Sickle Cell Disease, N/A N/A Comorbid History: Neuropathy 12/25/2020 N/A N/A Date Acquired: 3 N/A N/A Weeks of Treatment: Open N/A  N/A Wound Status: No N/A N/A Clustered Wound: 1.9x0.9x0.1 N/A N/A Measurements L x W x D (cm) 1.343 N/A N/A A (cm) : rea 0.134 N/A N/A Volume (cm) : 47.40% N/A N/A % Reduction in Area: 47.50% N/A N/A % Reduction in Volume: Full Thickness Without Exposed N/A N/A Classification: Support Structures Medium N/A N/A Exudate Amount: Serosanguineous N/A N/A Exudate Type: red, brown N/A N/A Exudate Color: Large (67-100%) N/A N/A Granulation Amount: Red N/A N/A Granulation Quality: Small (1-33%) N/A N/A Necrotic Amount: Fat Layer (Subcutaneous Tissue): Yes N/A N/A Exposed Structures: Fascia: No Tendon: No Muscle: No Joint: No Bone: No Small (1-33%) N/A N/A Epithelialization: Treatment Notes Electronic Signature(s) Signed: 05/08/2021 4:29:35 PM By: Linton Ham MD Signed: 05/08/2021 5:35:33 PM By: Levan Hurst RN, BSN Entered By: Linton Ham on 05/08/2021 11:11:58 -------------------------------------------------------------------------------- Multi-Disciplinary Care Plan Details Patient Name: Date of Service: [name], [name] 05/08/2021 10:15 A M Medical Record Number: [id number] Patient Account Number: [id number] Date of Birth/Sex: Treating RN: 1970/06/27 (51 y.o. Tonita Phoenix, Lauren Primary Care Crissy Mccreadie: [name] Other Clinician: Referring Zonnie Landen: Treating Ronelle Michie/Extender: Farrel Demark Weeks in Treatment: 3 Active Inactive Wound/Skin Impairment Nursing Diagnoses: Impaired tissue integrity Goals: Patient/caregiver will verbalize understanding of skin care regimen Date Initiated: 04/17/2021 Target Resolution Date: 05/15/2021 Goal Status: Active Ulcer/skin breakdown will have a volume reduction of 30% by week 4 Date Initiated: 04/17/2021 Target Resolution Date: 05/15/2021 Goal Status: Active Interventions: Assess patient/caregiver ability to obtain necessary supplies Assess patient/caregiver ability to perform  ulcer/skin care regimen upon admission and as needed Assess ulceration(s) every visit Provide education on ulcer and skin care Treatment Activities: Topical wound management initiated : 04/17/2021 Notes: Electronic Signature(s) Signed: 06/07/2021 12:37:40 PM By: Rhae Hammock RN Entered By: Rhae Hammock on 05/08/2021 11:23:13 -------------------------------------------------------------------------------- Pain Assessment Details Patient Name: Date of Service: [name], [name] 05/08/2021 10:15 A M Medical Record Number: [id number] Patient Account Number: [id number] Date of Birth/Sex: Treating RN: June 17, 1970 (51 y.o. [name] Primary Care Dantavious Snowball: [name] Other Clinician: Referring Magnum Lunde: Treating Lakeesha Fontanilla/Extender: Farrel Demark Weeks in Treatment: 3 Active Problems Location of Pain Severity and Description of Pain Patient Has Paino Patient Unable to Respond Site Locations Rate the pain. Current Pain Level: 4 Worst Pain Level: 10 Least Pain Level: 4 Tolerable Pain Level: 4 Character of Pain Describe the Pain: Burning Pain Management  and Medication Current Pain Management: Medication: Yes Cold Application: No Rest: No Massage: No Activity: No T.E.N.S.: No Heat Application: No Leg drop or elevation: No Is the Current Pain Management Adequate: Adequate How does your wound impact your activities of daily livingo Sleep: No Bathing: No Appetite: No Relationship With Others: No Bladder Continence: No Emotions: No Bowel Continence: No Work: No Toileting: No Drive: No Dressing: No Hobbies: No Electronic Signature(s) Signed: 05/08/2021 5:17:23 PM By: Dellie Catholic RN Entered By: Dellie Catholic on 05/08/2021 10:05:08 -------------------------------------------------------------------------------- Patient/Caregiver Education Details Patient Name: Date of Service: [name], [name] 12/20/2022andnbsp10:15 A  M Medical Record Number: [id number] Patient Account Number: [id number] Date of Birth/Gender: Treating RN: 23-Mar-1971 (51 y.o. Tonita Phoenix, Lauren Primary Care Physician: [name] Other Clinician: Referring Physician: Treating Physician/Extender: Bernette Redbird in Treatment: 3 Education Assessment Education Provided To: Patient Education Topics Provided Wound/Skin Impairment: Methods: Explain/Verbal Responses: State content correctly Electronic Signature(s) Signed: 06/07/2021 12:37:40 PM By: Rhae Hammock RN Entered By: Rhae Hammock on 05/08/2021 14:08:17 -------------------------------------------------------------------------------- Wound Assessment Details Patient Name: Date of Service: [name], [name] 05/08/2021 10:15 A M Medical Record Number: [id number] Patient Account Number: [id number] Date of Birth/Sex: Treating RN: [birth year] (51 y.o. [name] Primary Care Danaria Larsen: [name] Other Clinician: Referring Yancarlos Berthold: Treating Damyan Corne/Extender: Farrel Demark Weeks in Treatment: 3 Wound Status Wound Number: 17 Primary Etiology: Sickle Cell Lesion Wound Location: Right, Circumferential Lower Leg Wound Status: Open Wounding Event: Gradually Appeared Comorbid History: Anemia, Sickle Cell Disease, Neuropathy Date Acquired: 10/05/2012 Weeks Of Treatment: 3 Clustered Wound: Yes Photos Wound Measurements Length: (cm) 12.5 Width: (cm) 16.5 Depth: (cm) 0.3 Area: (cm) 161.988 Volume: (cm) 48.597 % Reduction in Area: 13.8% % Reduction in Volume: -158.6% Epithelialization: Small (1-33%) Tunneling: No Undermining: No Wound Description Classification: Full Thickness Without Exposed Support Structures Exudate Amount: Large Exudate Type: Serosanguineous Exudate Color: red, brown Foul Odor After Cleansing: No Slough/Fibrino Yes Wound Bed Granulation Amount: Large (67-100%) Exposed  Structure Granulation Quality: Pink Fascia Exposed: No Necrotic Amount: Small (1-33%) Fat Layer (Subcutaneous Tissue) Exposed: Yes Necrotic Quality: Adherent Slough Tendon Exposed: No Muscle Exposed: No Joint Exposed: No Bone Exposed: No Electronic Signature(s) Signed: 05/08/2021 5:17:23 PM By: Dellie Catholic RN Entered By: Dellie Catholic on 05/08/2021 10:38:52 -------------------------------------------------------------------------------- Wound Assessment Details Patient Name: Date of Service: [name], [name] 05/08/2021 10:15 A M Medical Record Number: [id number] Patient Account Number: [id number] Date of Birth/Sex: Treating RN: Jun 10, 1970 (51 y.o. [name] Primary Care Asyia Hornung: [name] Other Clinician: Referring Rosy Estabrook: Treating Corderius Saraceni/Extender: Farrel Demark Weeks in Treatment: 3 Wound Status Wound Number: 18 Primary Etiology: Sickle Cell Lesion Wound Location: Left, Posterior Ankle Wound Status: Open Wounding Event: Gradually Appeared Comorbid History: Anemia, Sickle Cell Disease, Neuropathy Date Acquired: 12/25/2020 Weeks Of Treatment: 3 Clustered Wound: No Photos Wound Measurements Length: (cm) 4.9 Width: (cm) 1.9 Depth: (cm) 0.2 Area: (cm) 7.312 Volume: (cm) 1.462 % Reduction in Area: 48.3% % Reduction in Volume: 65.5% Epithelialization: Small (1-33%) Tunneling: No Undermining: No Wound Description Classification: Full Thickness Without Exposed Support Structures Exudate Amount: Medium Exudate Type: Serosanguineous Exudate Color: red, brown Foul Odor After Cleansing: No Slough/Fibrino Yes Wound Bed Granulation Amount: Large (67-100%) Exposed Structure Granulation Quality: Pink Fascia Exposed: No Necrotic Amount: Small (1-33%) Fat Layer (Subcutaneous Tissue) Exposed: Yes Necrotic Quality: Adherent Slough Tendon Exposed: No Muscle Exposed: No Joint Exposed: No Bone Exposed: No Electronic  Signature(s) Signed: 05/08/2021 5:17:23 PM By: Dellie Catholic RN  Entered By: Dellie Catholic on 05/08/2021 10:41:18 -------------------------------------------------------------------------------- Wound Assessment Details Patient Name: Date of Service: [name] 05/08/2021 10:15 A M Medical Record Number: [id number] Patient Account Number: [id number] Date of Birth/Sex: Treating RN: 21-Nov-1970 (51 y.o. [name] Primary Care Zhi Geier: [name] Other Clinician: Referring Kolter Reaver: Treating Melynda Krzywicki/Extender: Farrel Demark Weeks in Treatment: 3 Wound Status Wound Number: 19 Primary Etiology: Sickle Cell Lesion Wound Location: Left, Medial Ankle Wound Status: Open Wounding Event: Gradually Appeared Comorbid History: Anemia, Sickle Cell Disease, Neuropathy Date Acquired: 12/25/2020 Weeks Of Treatment: 3 Clustered Wound: No Photos Wound Measurements Length: (cm) 5 Width: (cm) 2.9 Depth: (cm) 0.3 Area: (cm) 11.388 Volume: (cm) 3.416 % Reduction in Area: 46.3% % Reduction in Volume: 77% Epithelialization: Small (1-33%) Tunneling: No Undermining: No Wound Description Classification: Full Thickness Without Exposed Support Str Exudate Amount: Medium Exudate Type: Serosanguineous Exudate Color: red, brown uctures Wound Bed Granulation Amount: Large (67-100%) Exposed Structure Granulation Quality: Pink Fascia Exposed: No Necrotic Amount: Small (1-33%) Fat Layer (Subcutaneous Tissue) Exposed: Yes Necrotic Quality: Adherent Slough Tendon Exposed: No Muscle Exposed: No Joint Exposed: No Bone Exposed: No Electronic Signature(s) Signed: 05/08/2021 5:17:23 PM By: Dellie Catholic RN Entered By: Dellie Catholic on 05/08/2021 10:42:36 -------------------------------------------------------------------------------- Wound Assessment Details Patient Name: Date of Service: [name], [name] 05/08/2021 10:15 A M Medical Record Number:  [id number] Patient Account Number: [id number] Date of Birth/Sex: Treating RN: May 08, 1971 (51 y.o. [name] Primary Care Kleber Crean: [name] Other Clinician: Referring Kiyoko Mcguirt: Treating Sunnie Odden/Extender: Farrel Demark Weeks in Treatment: 3 Wound Status Wound Number: 20 Primary Etiology: Sickle Cell Lesion Wound Location: Left, Lateral Ankle Wound Status: Open Wounding Event: Gradually Appeared Comorbid History: Anemia, Sickle Cell Disease, Neuropathy Date Acquired: 12/25/2020 Weeks Of Treatment: 3 Clustered Wound: No Photos Wound Measurements Length: (cm) 1.9 Width: (cm) 0.9 Depth: (cm) 0.1 Area: (cm) 1.343 Volume: (cm) 0.134 % Reduction in Area: 47.4% % Reduction in Volume: 47.5% Epithelialization: Small (1-33%) Tunneling: No Undermining: No Wound Description Classification: Full Thickness Without Exposed Support Structures Exudate Amount: Medium Exudate Type: Serosanguineous Exudate Color: red, brown Foul Odor After Cleansing: No Slough/Fibrino Yes Wound Bed Granulation Amount: Large (67-100%) Exposed Structure Granulation Quality: Red Fascia Exposed: No Necrotic Amount: Small (1-33%) Fat Layer (Subcutaneous Tissue) Exposed: Yes Necrotic Quality: Adherent Slough Tendon Exposed: No Muscle Exposed: No Joint Exposed: No Bone Exposed: No Electronic Signature(s) Signed: 05/08/2021 5:17:23 PM By: Dellie Catholic RN Entered By: Dellie Catholic on 05/08/2021 10:39:58 -------------------------------------------------------------------------------- Vitals Details Patient Name: Date of Service: [name] [name], [name] 05/08/2021 10:15 A M Medical Record Number: [id number] Patient Account Number: [id number] Date of Birth/Sex: Treating RN: 05-08-1971 (50 y.o. [name] Primary Care Cheryln Balcom: [name] Other Clinician: Referring Adelayde Minney: Treating Shirline Kendle/Extender: Farrel Demark Weeks in Treatment:  3 Vital Signs Time Taken: 10:04 Temperature (F): 98 Height (in): 67 Pulse (bpm): 68 Respiratory Rate (breaths/min): 20 Blood Pressure (mmHg): 128/71 Reference Range: 80 - 120 mg / dl Electronic Signature(s) Signed: 05/08/2021 5:17:23 PM By: Dellie Catholic RN Entered By: Dellie Catholic on 05/08/2021 10:04:31

## 2021-06-07 NOTE — Progress Notes (Signed)
[name], [name] ([id number]) Visit Report for 05/08/2021 HPI Details Patient Name: Date of Service: [name] [name] 05/08/2021 10:15 A M Medical Record Number: [id number] Patient Account Number: [id number] Date of Birth/Sex: Treating RN: 1971-04-15 (51 y.o. [name] Primary Care Provider: Cammie [name] Other Clinician: Referring Provider: Treating Provider/Extender: [name] in Treatment: 3 History of Present Illness Location: medial and lateral ankle region on the right and left medial malleolus Quality: Patient reports experiencing a shooting pain to affected area(s). Severity: Patient states wound(s) are getting worse. Duration: right lower extremity bimalleolar ulcers have been present for approximately 2 years; the rright meedial malleolus ulcer has been there proximally 6 months Timing: Pain in wound is constant (hurts all the time) Context: The wound would happen gradually ssociated Signs and Symptoms: Patient reports having increase discharge. 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 Leland [name] for a surgical debridement as I was unable to do anything in the office due to excruciating pain. At that stage she was referred from the plastic surgery service to dermatology who treated her for a skin infection with doxycycline and then Levaquin and a local antibiotic ointment. I understand the patient has since developed ulceration on the left ankle both medial and lateral and was now referred back to the wound center as dermatology has finished the management. 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 She has been visiting the wound center [name] and was seen by Dr. Dellia [name] and Dr. Leland [name]. after prolonged conservator therapy between July 2016 and January 2017. She had been seen by the plastic surgeon and taken to the OR for debridement and application of [name]. She had 3 applications of [name] and was then treated with collagen. Prior to that she had a history of similar problems in 2014 and was treated conservatively. Had a reflux study done for the right lower extremity in August 2016 without reflux or DVT . Past medical history significant for [name] cell disease, anemia, leg ulcers, cholelithiasis,and has never been a [name]. Once the patient was discharged on the wound center she says within 2 or 3 [name] the problems recurred and she has been treating it conservatively. since I saw her 3 [name] ago at [name] she has been unable to get her dressing material but has completed a course of doxycycline. 6/7/ 2017 -- lower extremity venous duplex reflux evaluation was done No evidence of SVT or DVT in the RLL. No venous incompetence in the RLL. No further vascular workup is indicated at this time. She was seen by Dr. Glenis [name], on 10/04/2015. She agreed with the plan of taking her to the OR for debridement and application of [name] and would also take biopsies to rule out pyoderma gangrenosum. Follow-up note dated May 31 received and she was status post application of [name] to multiple ulcers around the right ankle. Pathology did not show evidence of malignancy or pyoderma gangrenosum. She would continue to see as in the wound clinic for further care and see Dr. Leland [name] as needed. The patient brought the biopsy report and it was consistent with stasis ulcer no evidence of malignancy and  the comment was that there was some adjacent neovascularization, fibrosis and patchy perivascular chronic inflammation. 11/15/2015 -- today we applied her first  application of [name] 11/30/15; [name] #2 12/13/2015 -- she is having a lot of pain locally and is here for possible application of a [name] today. 01/16/2016 -- the patient has significant pain and has noticed despite in spite of all local care and oral pain medication. It is impossible to debride her in the office. 02/06/2016 -- I do not see any notes from Dr. Iran [name]( the patient has not made a call to the office know as she heard from them) and the only visit to recently was with her PCP [name] -- I saw her on 01/16/2016 and prescribed 90 tablets of oxycodone 10 mg and did lab work and screening for HIV. the HIV was negative and hemoglobin was 6.3 with a WBC count of 14.9 and hematocrit of 17.8 with platelets of 561. reticulocyte count was 15.5% READMISSION: 07/10/2016- The patient is here for readmission for bilateral lower extremity ulcers in the presence of [name] cell. The bimalleolar ulcers to the right lower extremity have been present for approximately 2 years, the left medial malleolus ulcer has been present approximately 6 months. She has followed with [name] in the past and has had a total of 3 applications of [name] (01/2015, 09/2015, 06/17/16). She has also followed with Dr. Con [name] here in the clinic and has received 2 applications of [name] (11/10/15, 11/30/15). The patient does experience chronic, and is not amenable to debridement. She had a [name] cell crisis in December 2017, prior to that has been several years. She is not currently on any antibiotic therapy and has not been treated with any recently. 07/17/2016 -- was seen by Dr. Iran [name] of plastic surgery who saw her 2 [name] postop application of [name] #3. She had removed her dressing and asked her to apply silver alginate on alternate days and follow-up back with the wound center. Future debridements and application of skin substitute would have to be done in the [name] due to her high risk  for anesthesia. READMISSION 04/17/2021 Patient is now a 51 year old woman that we have had in this clinic for a prolonged period of time and 2016-2017 and then again for 2 visits in February 2018. At that point she had wounds on the right lower leg predominantly medial. She had also been seen by plastic surgery Dr. Leland [name] who I believe took her to the OR for operative debridement and application of [name] in 2017. After she left our clinic she was followed for a very prolonged period of time in the wound care center in [facility] who then referred her ultimately to [facility] where she was seen by Dr. Vernona Rieger. Again taken her to the OR for skin grafting which apparently did not take. She had multiple other attempts at dressings although I have not really looked over all of these notes in great detail. She has not been seen in a wound care center in about a year. She states over the last year in addition to her right lower leg she has developed wounds on the left lower leg quite extensive. She is using Xeroform to all of these wounds without really any improvement. She also has Medicaid which does not cover wound products. The patient has had vascular work-ups in the past including most recently on 03/28/2021 showing biphasic waveforms on the right triphasic at the PTA and biphasic at the dorsalis pedis on the left. She  was unable to tolerate any degree of compression to do ABIs. Unfortunately TBI's were also not done. She had venous reflux studies done in 2017. This did not show any evidence of a DVT or SVT and no venous incompetence was noted in the right leg at the time this was the only side with the wound As noted I did not look all over her old records. She apparently had a course of HBO and [facility] although I am not sure what the indication would have been. In any case she developed seizures and terminated treatment earlier. She is generally much more disabled than when we last saw her in  clinic. She can no longer walk pretty much wheelchair-bound because predominantly of pain in the left hip. 04/24/2021; the patient tolerated the wraps we put on. We used Santyl and Hydrofera Blue under compression. I brought her back for a nurse visit for a change in dressing. With Medicaid we will have a hard time getting anything paid for and hence the need for compression. She arrives in clinic with all the wounds looking somewhat better in terms of surface 12/20; circumferential wound on the right from the lateral to the medial. She has open areas on the left medial and left lateral x2 on all of this with the same surface. This does not look completely healthy although she does have some epithelialization. She is not complaining of a lot of pain which is unusual for her [name] ulcers. I have not looked over her extensive records from [name]. She had recent arterial studies and has a history of venous reflux studies I will need to look these over although I do not believe she has significant arterial disease Electronic Signature(s) Signed: 05/08/2021 4:29:35 PM By: Linton Ham MD Entered By: Linton Ham on 05/08/2021 11:14:11 -------------------------------------------------------------------------------- Physical Exam Details Patient Name: Date of Service: [name], [name] 05/08/2021 10:15 A M Medical Record Number: [id number] Patient Account Number: [id number] Date of Birth/Sex: Treating RN: 1970/10/09 (51 y.o. [name] Primary Care Provider: Cammie [name] Other Clinician: Referring Provider: Treating Provider/Extender: [name] in Treatment: 3 Constitutional Sitting or standing Blood Pressure is within target range for patient.. Pulse regular and within target range for patient.Marland Kitchen Respirations regular, non-labored and within target range.. Temperature is normal and within the target range for the patient.Marland Kitchen Appears in no  distress. Notes Wound exam; substantial almost circumferential area on the right lower leg extending posteriorly. On the left there is areas on the left medial ankle and left posterior ankle. Surface of these wounds is roughly the same she has epithelialization especially on the right although I think the surface of this will eventually need debridement it is for this reason that we are using Santyl Electronic Signature(s) Signed: 05/08/2021 4:29:35 PM By: Linton Ham MD Entered By: Linton Ham on 05/08/2021 11:15:33 -------------------------------------------------------------------------------- Physician Orders Details Patient Name: Date of Service: [name], [name] 05/08/2021 10:15 A M Medical Record Number: [id number] Patient Account Number: [id number] Date of Birth/Sex: Treating RN: June 27, 1970 (51 y.o. Tonita Phoenix, Lauren Primary Care Provider: Cammie [name] Other Clinician: Referring Provider: Treating Provider/Extender: Bernette Redbird in Treatment: 3 Verbal / Phone Orders: No Diagnosis Coding ICD-10 Coding Code Description L97.818 Non-pressure chronic ulcer of other part of right lower leg with other specified severity L97.828 Non-pressure chronic ulcer of other part of left lower leg with other specified severity D57.1 [name]-cell disease without crisis Follow-up Appointments ppointment in 2 [name]. - Dr.  Dellia [name] Return A Nurse Visit: - next Tuesday Bathing/ Shower/ Hygiene May shower with protection but do not get wound dressing(s) wet. - Can get cast protector bags at [name] or [facility] Additional Orders / Instructions Follow Nutritious Diet Wound Treatment Wound #17 - Lower Leg Wound Laterality: Right, Circumferential Cleanser: Soap and Water 2 x Per Week/30 Days Discharge Instructions: May shower and wash wound with dial antibacterial soap and water prior to dressing change. Cleanser: Wound Cleanser 2 x Per Week/30 Days Discharge  Instructions: Cleanse the wound with wound cleanser prior to applying a clean dressing using gauze sponges, not tissue or cotton balls. Prim Dressing: Hydrofera Blue Ready Foam, 2.5 x2.5 in 2 x Per Week/30 Days ary Discharge Instructions: Apply to wound bed as instructed Prim Dressing: Santyl Ointment 2 x Per Week/30 Days ary Discharge Instructions: Apply nickel thick amount to wound bed as instructed Secondary Dressing: ABD Pad, 5x9 2 x Per Week/30 Days Discharge Instructions: Apply over primary dressing as directed. Compression Wrap: Kerlix Roll 4.5x3.1 (in/yd) 2 x Per Week/30 Days Discharge Instructions: Apply Kerlix and Coban compression as directed. Compression Wrap: Coban Self-Adherent Wrap 4x5 (in/yd) 2 x Per Week/30 Days Discharge Instructions: Apply over Kerlix as directed. Wound #18 - Ankle Wound Laterality: Left, Posterior Cleanser: Soap and Water 2 x Per Week/30 Days Discharge Instructions: May shower and wash wound with dial antibacterial soap and water prior to dressing change. Cleanser: Wound Cleanser 2 x Per Week/30 Days Discharge Instructions: Cleanse the wound with wound cleanser prior to applying a clean dressing using gauze sponges, not tissue or cotton balls. Prim Dressing: Hydrofera Blue Ready Foam, 2.5 x2.5 in 2 x Per Week/30 Days ary Discharge Instructions: Apply to wound bed as instructed Prim Dressing: Santyl Ointment 2 x Per Week/30 Days ary Discharge Instructions: Apply nickel thick amount to wound bed as instructed Secondary Dressing: ABD Pad, 5x9 2 x Per Week/30 Days Discharge Instructions: Apply over primary dressing as directed. Compression Wrap: Kerlix Roll 4.5x3.1 (in/yd) 2 x Per Week/30 Days Discharge Instructions: Apply Kerlix and Coban compression as directed. Compression Wrap: Coban Self-Adherent Wrap 4x5 (in/yd) 2 x Per Week/30 Days Discharge Instructions: Apply over Kerlix as directed. Wound #19 - Ankle Wound Laterality: Left, Medial Cleanser:  Soap and Water 2 x Per Week/30 Days Discharge Instructions: May shower and wash wound with dial antibacterial soap and water prior to dressing change. Cleanser: Wound Cleanser 2 x Per Week/30 Days Discharge Instructions: Cleanse the wound with wound cleanser prior to applying a clean dressing using gauze sponges, not tissue or cotton balls. Prim Dressing: Hydrofera Blue Ready Foam, 2.5 x2.5 in 2 x Per Week/30 Days ary Discharge Instructions: Apply to wound bed as instructed Prim Dressing: Santyl Ointment 2 x Per Week/30 Days ary Discharge Instructions: Apply nickel thick amount to wound bed as instructed Secondary Dressing: ABD Pad, 5x9 2 x Per Week/30 Days Discharge Instructions: Apply over primary dressing as directed. Compression Wrap: Kerlix Roll 4.5x3.1 (in/yd) 2 x Per Week/30 Days Discharge Instructions: Apply Kerlix and Coban compression as directed. Compression Wrap: Coban Self-Adherent Wrap 4x5 (in/yd) 2 x Per Week/30 Days Discharge Instructions: Apply over Kerlix as directed. Wound #20 - Ankle Wound Laterality: Left, Lateral Cleanser: Soap and Water 2 x Per Week/30 Days Discharge Instructions: May shower and wash wound with dial antibacterial soap and water prior to dressing change. Cleanser: Wound Cleanser 2 x Per Week/30 Days Discharge Instructions: Cleanse the wound with wound cleanser prior to applying a clean dressing using gauze sponges, not tissue  or cotton balls. Prim Dressing: Hydrofera Blue Ready Foam, 2.5 x2.5 in 2 x Per Week/30 Days ary Discharge Instructions: Apply to wound bed as instructed Prim Dressing: Santyl Ointment 2 x Per Week/30 Days ary Discharge Instructions: Apply nickel thick amount to wound bed as instructed Secondary Dressing: ABD Pad, 5x9 2 x Per Week/30 Days Discharge Instructions: Apply over primary dressing as directed. Compression Wrap: Kerlix Roll 4.5x3.1 (in/yd) 2 x Per Week/30 Days Discharge Instructions: Apply Kerlix and Coban compression  as directed. Compression Wrap: Coban Self-Adherent Wrap 4x5 (in/yd) 2 x Per Week/30 Days Discharge Instructions: Apply over Kerlix as directed. Electronic Signature(s) Signed: 05/08/2021 4:29:35 PM By: Linton Ham MD Signed: 06/07/2021 12:37:40 PM By: Rhae Hammock RN Entered By: Rhae Hammock on 05/08/2021 11:22:55 -------------------------------------------------------------------------------- Problem List Details Patient Name: Date of Service: [name], [name] 05/08/2021 10:15 A M Medical Record Number: [id number] Patient Account Number: [id number] Date of Birth/Sex: Treating RN: [birth year] (51 y.o. [name] Primary Care Provider: Cammie [name] Other Clinician: Referring Provider: Treating Provider/Extender: [name] in Treatment: 3 Active Problems ICD-10 Encounter Code Description Active Date MDM Diagnosis L97.818 Non-pressure chronic ulcer of other part of right lower leg with other specified 04/17/2021 No Yes severity L97.828 Non-pressure chronic ulcer of other part of left lower leg with other specified 04/17/2021 No Yes severity D57.1 [name]-cell disease without crisis 04/17/2021 No Yes Inactive Problems Resolved Problems Electronic Signature(s) Signed: 05/08/2021 4:29:35 PM By: Linton Ham MD Entered By: Linton Ham on 05/08/2021 11:11:12 -------------------------------------------------------------------------------- Progress Note Details Patient Name: Date of Service: [name], [name] 05/08/2021 10:15 A M Medical Record Number: [id number] Patient Account Number: [id number] Date of Birth/Sex: Treating RN: [birth year] (51 y.o. [name] Primary Care Provider: Cammie [name] Other Clinician: Referring Provider: Treating Provider/Extender: [name] in Treatment: 3 Subjective History of Present Illness (HPI) The following HPI elements were documented for the  patient's wound: Location: medial and lateral ankle region on the right and left medial malleolus Quality: Patient reports experiencing a shooting pain to affected area(s). Severity: Patient states wound(s) are getting worse. Duration: right lower extremity bimalleolar ulcers have been present for approximately 2 years; the rright meedial malleolus ulcer has been there proximally 6 months Timing: Pain in wound is constant (hurts all the time) Context: The wound would happen gradually Associated Signs and Symptoms: Patient reports having increase discharge. 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 Leland [name] for a surgical debridement as I was unable to do anything in the office due to excruciating pain. At that stage she was referred from the plastic surgery service to dermatology who treated her for a skin infection with doxycycline and then Levaquin and a local antibiotic ointment. I understand the patient has since developed ulceration on the left ankle both medial and lateral and was now referred back to the wound center as dermatology has finished the management. 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 She has been visiting the wound center [facility] and was seen by Dr. Dellia [name] and Dr. Leland [name]. after prolonged conservator therapy between July 2016 and January 2017. She had been seen by the plastic  surgeon and taken to the OR for debridement and application of [name]. She had 3 applications of [name] and was then treated with collagen. Prior to that she had a history of similar problems in 2014 and was treated conservatively. Had a reflux study  done for the right lower extremity in August 2016 without reflux or DVT . Past medical history significant for [name] cell disease, anemia, leg ulcers, cholelithiasis,and has never been a [name]. Once the patient was discharged on the wound center she says within 2 or 3 [name] the problems recurred and she has been treating it conservatively. since I saw her 3 [name] ago at [facility] she has been unable to get her dressing material but has completed a course of doxycycline. 6/7/ 2017 -- lower extremity venous duplex reflux evaluation was done oo No evidence of SVT or DVT in the RLL. No venous incompetence in the RLL. No further vascular workup is indicated at this time. She was seen by Dr. Glenis [name], on 10/04/2015. She agreed with the plan of taking her to the OR for debridement and application of [name] and would also take biopsies to rule out pyoderma gangrenosum. Follow-up note dated May 31 received and she was status post application of [name] to multiple ulcers around the right ankle. Pathology did not show evidence of malignancy or pyoderma gangrenosum. She would continue to see as in the wound clinic for further care and see Dr. Leland [name] as needed. The patient brought the biopsy report and it was consistent with stasis ulcer no evidence of malignancy and the comment was that there was some adjacent neovascularization, fibrosis and patchy perivascular chronic inflammation. 11/15/2015 -- today we applied her first application of [name] 11/30/15; [name] #2 12/13/2015 -- she is having a lot of pain locally and is here for possible application of a [name] today. 01/16/2016 -- the patient has significant pain and has noticed despite in spite of all local care and oral pain medication. It is impossible to debride her in the office. 02/06/2016 -- I do not see any notes from Dr. Iran [name]( the patient has not made a call to the office know as she heard from them) and the only visit  to recently was with her PCP [name] -- I saw her on 01/16/2016 and prescribed 90 tablets of oxycodone 10 mg and did lab work and screening for HIV. the HIV was negative and hemoglobin was 6.3 with a WBC count of 14.9 and hematocrit of 17.8 with platelets of 561. reticulocyte count was 15.5% READMISSION: 07/10/2016- The patient is here for readmission for bilateral lower extremity ulcers in the presence of [name] cell. The bimalleolar ulcers to the right lower extremity have been present for approximately 2 years, the left medial malleolus ulcer has been present approximately 6 months. She has followed with [name] in the past and has had a total of 3 applications of [name] (01/2015, 09/2015, 06/17/16). She has also followed with Dr. Con [name] here in the clinic and has received 2 applications of [name] (11/10/15, 11/30/15). The patient does experience chronic, and is not amenable to debridement. She had a [name] cell crisis in December 2017, prior to that has been several years. She is not currently on any antibiotic therapy and has not been treated with any recently. 07/17/2016 -- was seen by Dr. Iran [name] of plastic surgery who saw her 2 [name] postop application of [name] #3. She had removed her dressing and asked her to apply silver alginate on alternate days and follow-up  back with the wound center. Future debridements and application of skin substitute would have to be done in the [name] due to her high risk for anesthesia. READMISSION 04/17/2021 Patient is now a 51 year old woman that we have had in this clinic for a prolonged period of time and 2016-2017 and then again for 2 visits in February 2018. At that point she had wounds on the right lower leg predominantly medial. She had also been seen by plastic surgery Dr. Leland [name] who I believe took her to the OR for operative debridement and application of [name] in 2017. After she left our clinic she was followed for a very  prolonged period of time in the wound care center in [place] [name] who then referred her ultimately to [name] [place] [name] where she was seen by Dr. Vernona Rieger. Again taken her to the OR for skin grafting which apparently did not take. She had multiple other attempts at dressings although I have not really looked over all of these notes in great detail. She has not been seen in a wound care center in about a year. She states over the last year in addition to her right lower leg she has developed wounds on the left lower leg quite extensive. She is using Xeroform to all of these wounds without really any improvement. She also has Medicaid which does not cover wound products. The patient has had vascular work-ups in the past including most recently on 03/28/2021 showing biphasic waveforms on the right triphasic at the PTA and biphasic at the dorsalis pedis on the left. She was unable to tolerate any degree of compression to do ABIs. Unfortunately TBI's were also not done. She had venous reflux studies done in 2017. This did not show any evidence of a DVT or SVT and no venous incompetence was noted in the right leg at the time this was the only side with the wound As noted I did not look all over her old records. She apparently had a course of HBO and [facility] although I am not sure what the indication would have been. In any case she developed seizures and terminated treatment earlier. She is generally much more disabled than when we last saw her in clinic. She can no longer walk pretty much wheelchair-bound because predominantly of pain in the left hip. 04/24/2021; the patient tolerated the wraps we put on. We used Santyl and Hydrofera Blue under compression. I brought her back for a nurse visit for a change in dressing. With Medicaid we will have a hard time getting anything paid for and hence the need for compression. She arrives in clinic with all the wounds looking somewhat better in terms of surface 12/20;  circumferential wound on the right from the lateral to the medial. She has open areas on the left medial and left lateral x2 on all of this with the same surface. This does not look completely healthy although she does have some epithelialization. She is not complaining of a lot of pain which is unusual for her [name] ulcers. I have not looked over her extensive records from [facility] ([facility] She had recent arterial studies and has a history of venous reflux studies I will need to look these over although I do not believe she has significant arterial disease Objective Constitutional Sitting or standing Blood Pressure is within target range for patient.. Pulse regular and within target range for patient.Marland Kitchen Respirations regular, non-labored and within target range.. Temperature is normal and within the target range for the patient.Marland Kitchen Appears in  no distress. Vitals Time Taken: 10:04 AM, Height: 67 in, Temperature: 98 F, Pulse: 68 bpm, Respiratory Rate: 20 breaths/min, Blood Pressure: 128/71 mmHg. General Notes: Wound exam; substantial almost circumferential area on the right lower leg extending posteriorly. On the left there is areas on the left medial ankle and left posterior ankle. Surface of these wounds is roughly the same she has epithelialization especially on the right although I think the surface of this will eventually need debridement it is for this reason that we are using Santyl Integumentary (Hair, Skin) Wound #17 status is Open. Original cause of wound was Gradually Appeared. The date acquired was: 10/05/2012. The wound has been in treatment 3 [name]. The wound is located on the Right,Circumferential Lower Leg. The wound measures 12.5cm length x 16.5cm width x 0.3cm depth; 161.988cm^2 area and 48.597cm^3 volume. There is Fat Layer (Subcutaneous Tissue) exposed. There is no tunneling or undermining noted. There is a large amount of serosanguineous drainage noted. There is large (67-100%) pink  granulation within the wound bed. There is a small (1-33%) amount of necrotic tissue within the wound bed including Adherent Slough. Wound #18 status is Open. Original cause of wound was Gradually Appeared. The date acquired was: 12/25/2020. The wound has been in treatment 3 [name]. The wound is located on the Left,Posterior Ankle. The wound measures 4.9cm length x 1.9cm width x 0.2cm depth; 7.312cm^2 area and 1.462cm^3 volume. There is Fat Layer (Subcutaneous Tissue) exposed. There is no tunneling or undermining noted. There is a medium amount of serosanguineous drainage noted. There is large (67-100%) pink granulation within the wound bed. There is a small (1-33%) amount of necrotic tissue within the wound bed including Adherent Slough. Wound #19 status is Open. Original cause of wound was Gradually Appeared. The date acquired was: 12/25/2020. The wound has been in treatment 3 [name]. The wound is located on the Left,Medial Ankle. The wound measures 5cm length x 2.9cm width x 0.3cm depth; 11.388cm^2 area and 3.416cm^3 volume. There is Fat Layer (Subcutaneous Tissue) exposed. There is no tunneling or undermining noted. There is a medium amount of serosanguineous drainage noted. There is large (67-100%) pink granulation within the wound bed. There is a small (1-33%) amount of necrotic tissue within the wound bed including Adherent Slough. Wound #20 status is Open. Original cause of wound was Gradually Appeared. The date acquired was: 12/25/2020. The wound has been in treatment 3 [name]. The wound is located on the Left,Lateral Ankle. The wound measures 1.9cm length x 0.9cm width x 0.1cm depth; 1.343cm^2 area and 0.134cm^3 volume. There is Fat Layer (Subcutaneous Tissue) exposed. There is no tunneling or undermining noted. There is a medium amount of serosanguineous drainage noted. There is large (67-100%) red granulation within the wound bed. There is a small (1-33%) amount of necrotic tissue within the wound  bed including Adherent Slough. 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 I have continued with Santyl and Hydrofera Blue under compression. The compression has more to do with reducing the frequency of dressing changes than anything else [Medicaid] 2. I think the wound surface generally would benefit from debridement I am not sure if I can get her through this although she is not complaining of pain 3. I will see if I can review her vascular status. Some of this may be to have  done at [facility] and during her extensive stays in [facility].I do not believe she had an arterial issue and I think she had recent arterial Dopplers Electronic Signature(s) Signed: 05/08/2021 4:29:35 PM By: Linton Ham MD Entered By: Linton Ham on 05/08/2021 11:22:24 -------------------------------------------------------------------------------- SuperBill Details Patient Name: Date of Service: Dawn, [name] 05/08/2021 Medical Record Number: [id number] Patient Account Number: [id number] Date of Birth/Sex: Treating RN: Mar 11, 1971 (51 y.o. 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 Facility Procedures CPT4 Code: [id number] Description: [phone] - WOUND CARE VISIT-LEV 5 EST PT Modifier: Quantity: 1 Physician Procedures : CPT4 Code Description Modifier [id number] - WC PHYS  LEVEL 3 - EST PT ICD-10 Diagnosis Description L97.818 Non-pressure chronic ulcer of other part of right lower leg with other specified severity L97.828 Non-pressure chronic ulcer of other part of  left lower leg with other specified severity D57.1 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

## 2021-06-12 ENCOUNTER — Telehealth: Payer: Self-pay

## 2021-06-12 ENCOUNTER — Other Ambulatory Visit: Payer: Self-pay | Admitting: Family Medicine

## 2021-06-12 ENCOUNTER — Encounter (HOSPITAL_BASED_OUTPATIENT_CLINIC_OR_DEPARTMENT_OTHER): Payer: Medicaid Other | Admitting: Internal Medicine

## 2021-06-12 DIAGNOSIS — G894 Chronic pain syndrome: Secondary | ICD-10-CM

## 2021-06-12 MED ORDER — OXYCODONE HCL 10 MG PO TABS: 10.0000 mg | ORAL_TABLET | ORAL | 0 refills | Status: DC

## 2021-06-12 NOTE — Telephone Encounter (Signed)
Oxycodone  °

## 2021-06-12 NOTE — Progress Notes (Signed)
Reviewed PDMP substance reporting system prior to prescribing opiate medications. No inconsistencies noted.  Meds ordered this encounter  Medications   Oxycodone HCl 10 MG TABS    Sig: Take 1 tablet (10 mg total) by mouth every 4 (four) hours while awake.    Dispense:  90 tablet    Refill:  0    Order Specific Question:   Supervising Provider    Answer:   JEGEDE, OLUGBEMIGA E [[id number]]   Maveryck Bahri Moore Bastian Andreoli  APRN, MSN, FNP-C [facility] [address]  [place], [place] [address] [phone]  

## 2021-06-19 ENCOUNTER — Other Ambulatory Visit: Payer: Self-pay | Admitting: Family Medicine

## 2021-06-19 ENCOUNTER — Other Ambulatory Visit: Payer: Self-pay

## 2021-06-19 ENCOUNTER — Other Ambulatory Visit: Payer: Medicaid Other

## 2021-06-19 ENCOUNTER — Encounter (HOSPITAL_BASED_OUTPATIENT_CLINIC_OR_DEPARTMENT_OTHER): Payer: Medicaid Other | Admitting: Internal Medicine

## 2021-06-19 DIAGNOSIS — L97818 Non-pressure chronic ulcer of other part of right lower leg with other specified severity: Secondary | ICD-10-CM | POA: Diagnosis not present

## 2021-06-19 DIAGNOSIS — D571 Sickle-cell disease without crisis: Secondary | ICD-10-CM

## 2021-06-19 NOTE — Progress Notes (Signed)
[name], [name] ([id number]) Visit Report for 06/19/2021 Debridement Details Patient Name: Date of Service: [name] 06/19/2021 10:00 A M Medical Record Number: [id number] Patient Account Number: [id number] Date of Birth/Sex: Treating RN: 12-08-1970 (51 y.o. [name] Primary Care Provider: [name] Other Clinician: Referring Provider: Treating Provider/Extender: [name] in Treatment: 9 Debridement Performed for Assessment: Wound #19 Left,Medial Ankle Performed By: Physician Ricard Dillon., MD Debridement Type: Debridement Level of Consciousness (Pre-procedure): Awake and Alert Pre-procedure Verification/Time Out Yes - 10:49 Taken: Start Time: 10:50 Pain Control: Other : Benzocaine T Area Debrided (L x W): otal 3.2 (cm) x 1.5 (cm) = 4.8 (cm) Tissue and other material debrided: Non-Viable, Slough, Subcutaneous, Slough Level: Skin/Subcutaneous Tissue Debridement Description: Excisional Instrument: Curette Bleeding: Minimum Hemostasis Achieved: Pressure End Time: 10:54 Response to Treatment: Procedure was tolerated well Level of Consciousness (Post- Awake and Alert procedure): Post Debridement Measurements of Total Wound Length: (cm) 3.2 Width: (cm) 1.5 Depth: (cm) 0.1 Volume: (cm) 0.377 Character of Wound/Ulcer Post Debridement: Stable Post Procedure Diagnosis Same as Pre-procedure Electronic Signature(s) Signed: 06/19/2021 4:28:46 PM By: Linton Ham MD Signed: 06/19/2021 5:37:15 PM By: Levan Hurst RN, BSN Entered By: Linton Ham on 06/19/2021 11:31:59 -------------------------------------------------------------------------------- HPI Details Patient Name: Date of Service: [name] [name], [name] 06/19/2021 10:00 A M Medical Record Number: [id number] Patient Account Number: [id number] Date of Birth/Sex: Treating RN: 04/23/1971 (51 y.o. [name] Primary Care Provider: [name] Other  Clinician: Referring Provider: Treating Provider/Extender: [name] in Treatment: 9 History of Present Illness Location: medial and lateral ankle region on the right and left medial malleolus Quality: Patient reports experiencing a shooting pain to affected area(s). Severity: Patient states wound(s) are getting worse. Duration: right lower extremity bimalleolar ulcers have been present for approximately 2 years; the rright meedial malleolus ulcer has been there proximally 6 months Timing: Pain in wound is constant (hurts all the time) Context: The wound would happen gradually ssociated Signs and Symptoms: Patient reports having increase discharge. A HPI Description: 51 year old patient with a history of sickle cell anemia who was last seen by me with ulceration of the right lower extremity above the ankle and was referred to Dr. Leland [name] for a surgical debridement as I was unable to do anything in the office due to excruciating pain. At that stage she was referred from the plastic surgery service to dermatology who treated her for a skin infection with doxycycline and then Levaquin and a local antibiotic ointment. I understand the patient has since developed ulceration on the left ankle both medial and lateral and was now referred back to the wound center as dermatology has finished the management. I do not have any notes from the dermatology department Old notes: 51 year old patient with a history of sickle cell anemia, pain bilateral lower extremities, right lower extremity ulcer and has a history of receiving a skin graft( Theraskin) several months ago. She has been visiting the wound center [facility] and was seen by Dr. Dellia Nims and Dr. Leland [name]. after prolonged conservator therapy between July 2016 and January 2017. She had been seen by the plastic surgeon and taken to the OR for debridement and application of Theraskin. She had 3 applications of Theraskin  and was then treated with collagen. Prior to that she had a history of similar problems in 2014 and was treated conservatively. Had a reflux study done for the right lower extremity in August 2016 without reflux or DVT .  Past medical history significant for sickle cell disease, anemia, leg ulcers, cholelithiasis,and has never been a smoker. Once the patient was discharged on the wound center she says within 2 or 3 [name] the problems recurred and she has been treating it conservatively. since I saw her 3 [name] ago at [facility] she has been unable to get her dressing material but has completed a course of doxycycline. 6/7/ 2017 -- lower extremity venous duplex reflux evaluation was done No evidence of SVT or DVT in the RLL. No venous incompetence in the RLL. No further vascular workup is indicated at this time. She was seen by Dr. [name], on 10/04/2015. She agreed with the plan of taking her to the OR for debridement and application of theraskin and would also take biopsies to rule out pyoderma gangrenosum. Follow-up note dated May 31 received and she was status post application of Theraskin to multiple ulcers around the right ankle. Pathology did not show evidence of malignancy or pyoderma gangrenosum. She would continue to see as in the wound clinic for further care and see Dr. Leland [name] as needed. The patient brought the biopsy report and it was consistent with stasis ulcer no evidence of malignancy and the comment was that there was some adjacent neovascularization, fibrosis and patchy perivascular chronic inflammation. 11/15/2015 -- today we applied her first application of Theraskin 11/30/15; TheraSkin #2 12/13/2015 -- she is having a lot of pain locally and is here for possible application of a theraskin today. 01/16/2016 -- the patient has significant pain and has noticed despite in spite of all local care and oral pain medication. It is impossible to debride her in  the office. 02/06/2016 -- I do not see any notes from Dr. Iran Planas( the patient has not made a call to the office know as she heard from them) and the only visit to recently was with her PCP Dr. Danella Penton -- I saw her on 01/16/2016 and prescribed 90 tablets of oxycodone 10 mg and did lab work and screening for HIV. the HIV was negative and hemoglobin was 6.3 with a WBC count of 14.9 and hematocrit of 17.8 with platelets of 561. 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 The bimalleolar ulcers to the right lower extremity have been present for approximately 2 years, the left medial malleolus ulcer has been present approximately 6 months. She has followed with Dr.Thimmappa in the past and has had a total of 3 applications of Theraskin (01/2015, 09/2015, 06/17/16). She has also followed with Dr. Con Memos here in the clinic and has received 2 applications of TheraSkin (11/10/15, 11/30/15). The patient does experience chronic, and is not amenable to debridement. 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 She is not currently on any antibiotic therapy and has not been treated with any recently. 07/17/2016 -- was seen by Dr. Iran Planas of plastic surgery who saw her 2 [name] postop application of Theraskin #3. She had removed her dressing and asked her to apply silver alginate on alternate days and follow-up back with the wound center. Future debridements and application of skin substitute would have to be done in the hospital due to her high risk for anesthesia. READMISSION 04/17/2021 Patient is now a 51 year old woman that we have had in this clinic for a prolonged period of time and 2016-2017 and then again for 2 visits in February 2018. At  that point she had wounds on the right lower leg predominantly medial. She had also been seen by plastic surgery  Dr. Leland [name] who I believe took her to the OR for operative debridement and application of TheraSkin in 2017. After she left our clinic she was followed for a very prolonged period of time in the wound care center in [facility] who then referred her ultimately to [facility] where she was seen by Dr. Vernona Rieger. Again taken her to the OR for skin grafting which apparently did not take. She had multiple other attempts at dressings although I have not really looked over all of these notes in great detail. She has not been seen in a wound care center in about a year. She states over the last year in addition to her right lower leg she has developed wounds on the left lower leg quite extensive. She is using Xeroform to all of these wounds without really any improvement. She also has Medicaid which does not cover wound products. The patient has had vascular work-ups in the past including most recently on 03/28/2021 showing biphasic waveforms on the right triphasic at the PTA and biphasic at the dorsalis pedis on the left. She was unable to tolerate any degree of compression to do ABIs. Unfortunately TBI's were also not done. She had venous reflux studies done in 2017. This did not show any evidence of a DVT or SVT and no venous incompetence was noted in the right leg at the time this was the only side with the wound As noted I did not look all over her old records. She apparently had a course of HBO and [facility] although I am not sure what the indication would have been. In any case she developed seizures and terminated treatment earlier. She is generally much more disabled than when we last saw her in clinic. She can no longer walk pretty much wheelchair-bound because predominantly of pain in the left hip. 04/24/2021; the patient tolerated the wraps we put on. We used Santyl and Hydrofera Blue under compression. I brought her back for a nurse visit for a change in dressing. With Medicaid we will have a hard time  getting anything paid for and hence the need for compression. She arrives in clinic with all the wounds looking somewhat better in terms of surface 12/20; circumferential wound on the right from the lateral to the medial. She has open areas on the left medial and left lateral x2 on all of this with the same surface. This does not look completely healthy although she does have some epithelialization. She is not complaining of a lot of pain which is unusual for her sickle ulcers. I have not looked over her extensive records from [facility]. She had recent arterial studies and has a history of venous reflux studies I will need to look these over although I do not believe she has significant arterial disease 2023 05/22/2021; patient's wound areas measure slightly smaller. Still a lot of drainage coming from the right we have been using Hydrofera Blue and Santyl with some improvement in the wound surfaces. She tells me she will be getting transfused later in the week for her underlying sickle cell anemia I have looked over her recent arterial studies which were done in the fall. This was in November and showed biphasic and triphasic waveforms but she could not tolerate ABIs because of pressure and unfortunately TBI's were not done. She has not had recent venous reflux studies that I can see  1/10; not much change about the same surface area. This has a yellowish surface to it very gritty. We have been using Santyl and Hydrofera Blue for a prolonged period. Culture I did last week showed methicillin sensitive staph aureus "rare". Our intake nurse reports greenish drainage which may be the Hydrofera Blue itself 1/17; wounds are continue to measure smaller although I am not sure about the accuracy here. Especially the areas on the right are covered in what looks to be a nonviable surface although she does have some epithelialization. Similarly she has areas on the left medial and left lateral ankle area which  appear to have a better surface and perhaps are slightly smaller. We have been using Santyl and Hydrofera Blue. She cannot tolerate mechanical debridements She went for her reflux studies which showed significant reflux at the greater saphenous vein at the saphenofemoral junction as well as the greater saphenous vein in the proximal calf on the left she had reflux in the thigh and the common femoral vein and supra vein Fishel vein reflux in the greater saphenous vein. I will have vein and vascular look at this. My thoughts have been that these are likely sickle wounds. I looked through her old records from [facility] wound care center and then when she graduated to [name] wound care center where she saw Dr. Zigmund Daniel and Dr. Vernona Rieger. Although I can see she had reflux studies done I do not see that she actually saw a vein and vascular. I went over the fact that she had operative debridements and actual skin grafting that did not take. I do not think these wounds have ever really progressed towards healing 1/31;Substantial wounds on the right ankle area. Hyper granulated very gritty adherent debris on the surface. 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 On the left she has no DVT or SVT deep vein reflux in the common femoral vein. Superficial vein reflux in the greater saphenous vein on although the vein diameter is not really all that large. I do not think there is anything that can be done with these although I am going to send her for consultation to vein and vascular. Electronic Signature(s) Signed: 06/19/2021 4:28:46 PM By: Linton Ham MD Entered By: Linton Ham on 06/19/2021 11:44:42 -------------------------------------------------------------------------------- Physical Exam Details Patient Name: Date of Service: [name] [name], [name] 06/19/2021 10:00 A M Medical Record Number: [id number] Patient Account Number: [id number] Date of Birth/Sex: Treating RN: 02/12/1971 (50 y.o. [name] Primary Care Provider: [name] Other Clinician: Referring Provider: Treating Provider/Extender: [name] in Treatment: 9 Constitutional Sitting or standing Blood Pressure is within target range for patient.. Pulse regular and within target range for patient.Marland Kitchen Respirations regular, non-labored and within target range.. Temperature is normal and within the target range for the patient.Marland Kitchen Appears in no distress. Notes Wound exam; right lower leg wound hyper granulated with a completely nonviable surface. This would take a substantial debridement I could not get her through this in this clinic. The area on the left medial ankle has a similar surface I used an open curette to  debride this I was able to get her through this 15-second debridement removing subcutaneous tissue attempting to get to a more viable surface. Hemostasis with direct pressure Electronic Signature(s) Signed: 06/19/2021 4:28:46 PM By: Linton Ham MD Entered By: Linton Ham on 06/19/2021 11:47:05 -------------------------------------------------------------------------------- Physician Orders Details Patient Name: Date of Service: [name] [name], [name] 06/19/2021 10:00 [place] Record Number: [id number] Patient Account Number: [id number] Date of Birth/Sex: Treating RN: [birth year] (51 y.o. [name] Primary Care Provider: [name] Other Clinician: Referring Provider: Treating Provider/Extender: Bernette Redbird in Treatment: 9 Verbal / Phone Orders: No Diagnosis Coding ICD-10  Coding Code Description L97.818 Non-pressure chronic ulcer of other part of right lower leg with other specified severity L97.828 Non-pressure chronic ulcer of other part of left lower leg with other specified severity 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 Follow-up Appointments ppointment in 1 week. - Dr. Dellia Nims Return A Other: - Has [facility] appt 06/29/21 for venous reflux Bathing/ Shower/ Hygiene May shower with protection but do not get wound dressing(s) wet. - Can get cast protector bags at [facility] or [facility] Additional Orders / Instructions Follow Nutritious Diet Wound Treatment Wound #17 - Lower Leg Wound Laterality: Right, Circumferential Cleanser: Soap and Water 2 x Per Week/30 Days Discharge Instructions: May shower and wash wound with dial antibacterial soap and water prior to dressing change. Cleanser: Wound Cleanser 2 x Per Week/30 Days Discharge Instructions: Cleanse the wound with wound cleanser prior to applying a clean dressing using gauze sponges, not tissue or cotton balls. Peri-Wound Care: Triamcinolone 15 (g) 2 x Per Week/30 Days Discharge Instructions: Use triamcinolone 15 (g) as directed Peri-Wound Care: Sween Lotion (Moisturizing lotion) 2 x Per Week/30 Days Discharge Instructions: Apply moisturizing lotion as directed Topical: Antibiotic Ointment 2 x Per Week/30 Days Prim Dressing: Hydrofera Blue Classic Foam, 4x4 in 2 x Per Week/30 Days ary Discharge Instructions: Moisten with saline prior to applying to wound bed Secondary Dressing: ABD Pad, 5x9 2 x Per Week/30 Days Discharge Instructions: Apply over primary dressing as directed. Compression Wrap: Kerlix Roll 4.5x3.1 (in/yd) 2 x Per Week/30 Days Discharge Instructions: Apply Kerlix and Coban compression as directed. Compression Wrap: Coban Self-Adherent Wrap 4x5 (in/yd) 2 x Per Week/30 Days Discharge Instructions: Apply over Kerlix as directed. Wound #18 - Ankle Wound Laterality: Left, Posterior Cleanser: Soap  and Water 2 x Per Week/30 Days Discharge Instructions: May shower and wash wound with dial antibacterial soap and water prior to dressing change. Cleanser: Wound Cleanser 2 x Per Week/30 Days Discharge Instructions: Cleanse the wound with wound cleanser prior to applying a clean dressing using gauze sponges, not tissue or cotton balls. Peri-Wound Care: Triamcinolone 15 (g) 2 x Per Week/30 Days Discharge Instructions: Use triamcinolone 15 (g) as directed Peri-Wound Care: Sween Lotion (Moisturizing lotion) 2 x Per Week/30 Days Discharge Instructions: Apply moisturizing lotion as directed Topical: Antibiotic Ointment 2 x Per Week/30 Days Prim Dressing: Hydrofera Blue Classic Foam, 4x4 in 2 x Per Week/30 Days ary Discharge Instructions: Moisten with saline prior to applying to wound bed Secondary Dressing: ABD Pad, 5x9 2 x Per Week/30 Days Discharge Instructions: Apply over primary dressing as directed. Compression Wrap: Kerlix Roll 4.5x3.1 (in/yd) 2 x Per Week/30 Days Discharge Instructions: Apply Kerlix and Coban compression as directed. Compression Wrap: Coban Self-Adherent Wrap 4x5 (in/yd) 2 x Per Week/30 Days Discharge Instructions: Apply over Kerlix as directed. Wound #19 - Ankle Wound Laterality: Left, Medial Cleanser: Soap and Water 2 x Per Week/30 Days Discharge  Instructions: May shower and wash wound with dial antibacterial soap and water prior to dressing change. Cleanser: Wound Cleanser 2 x Per Week/30 Days Discharge Instructions: Cleanse the wound with wound cleanser prior to applying a clean dressing using gauze sponges, not tissue or cotton balls. Peri-Wound Care: Triamcinolone 15 (g) 2 x Per Week/30 Days Discharge Instructions: Use triamcinolone 15 (g) as directed Peri-Wound Care: Sween Lotion (Moisturizing lotion) 2 x Per Week/30 Days Discharge Instructions: Apply moisturizing lotion as directed Topical: Antibiotic Ointment 2 x Per Week/30 Days Prim Dressing: Hydrofera Blue  Classic Foam, 4x4 in 2 x Per Week/30 Days ary Discharge Instructions: Moisten with saline prior to applying to wound bed Secondary Dressing: ABD Pad, 5x9 2 x Per Week/30 Days Discharge Instructions: Apply over primary dressing as directed. Compression Wrap: Kerlix Roll 4.5x3.1 (in/yd) 2 x Per Week/30 Days Discharge Instructions: Apply Kerlix and Coban compression as directed. Compression Wrap: Coban Self-Adherent Wrap 4x5 (in/yd) 2 x Per Week/30 Days Discharge Instructions: Apply over Kerlix as directed. Electronic Signature(s) Signed: 06/19/2021 4:25:26 PM By: Lorrin Jackson Signed: 06/19/2021 4:28:46 PM By: Linton Ham MD Entered By: Lorrin Jackson on 06/19/2021 10:57:11 -------------------------------------------------------------------------------- Problem List Details Patient Name: Date of Service: [name] [name], [name] 06/19/2021 10:00 A M Medical Record Number: [id number] Patient Account Number: [id number] Date of Birth/Sex: Treating RN: 04/04/1971 (51 y.o. [name] Primary Care Provider: [name] Other Clinician: Referring Provider: Treating Provider/Extender: [name] in Treatment: 9 Active Problems ICD-10 Encounter Code Description Active Date MDM Diagnosis L97.818 Non-pressure chronic ulcer of other part of right lower leg with other specified 04/17/2021 No Yes severity L97.828 Non-pressure chronic ulcer of other part of left lower leg with other specified 04/17/2021 No Yes severity 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 04/17/2021 No Yes Inactive Problems Resolved Problems Electronic Signature(s) Signed: 06/19/2021 4:28:46 PM By: Linton Ham MD Entered By: Linton Ham on 06/19/2021 11:29:11 -------------------------------------------------------------------------------- Progress Note Details Patient Name: Date of Service: [name] [name], [name] 06/19/2021 10:00 A M Medical Record Number: [id number] Patient Account  Number: [id number] Date of Birth/Sex: Treating RN: 1971-01-28 (51 y.o. [name] Primary Care Provider: [name] Other Clinician: Referring Provider: Treating Provider/Extender: [name] in Treatment: 9 Subjective History of Present Illness (HPI) The following HPI elements were documented for the patient's wound: Location: medial and lateral ankle region on the right and left medial malleolus Quality: Patient reports experiencing a shooting pain to affected area(s). Severity: Patient states wound(s) are getting worse. Duration: right lower extremity bimalleolar ulcers have been present for approximately 2 years; the rright meedial malleolus ulcer has been there proximally 6 months Timing: Pain in wound is constant (hurts all the time) Context: The wound would happen gradually Associated Signs and Symptoms: Patient reports having increase discharge. 51 year old patient with a history of sickle cell anemia who was last seen by me with ulceration of the right lower extremity above the ankle and was referred to Dr. Leland [name] for a surgical debridement as I was unable to do anything in the office due to excruciating pain. At that stage she was referred from the plastic surgery service to dermatology who treated her for a skin infection with doxycycline and then Levaquin and a local antibiotic ointment. I understand the patient has since developed ulceration on the left ankle both medial and lateral and was now referred back to the wound center as dermatology has finished the management. I do not have any notes from the dermatology department  Old notes: 51 year old patient with a history of sickle cell anemia, pain bilateral lower extremities, right lower extremity ulcer and has a history of receiving a skin graft( Theraskin) several months ago. She has been visiting the wound center [facility] and was seen by Dr. Dellia Nims and Dr. Leland [name]. after prolonged  conservator therapy between July 2016 and January 2017. She had been seen by the plastic surgeon and taken to the OR for debridement and application of Theraskin. She had 3 applications of Theraskin and was then treated with collagen. Prior to that she had a history of similar problems in 2014 and was treated conservatively. Had a reflux study done for the right lower extremity in August 2016 without reflux or DVT . Past medical history significant for sickle cell disease, anemia, leg ulcers, cholelithiasis,and has never been a smoker. Once the patient was discharged on the wound center she says within 2 or 3 [name] the problems recurred and she has been treating it conservatively. since I saw her 3 [name] ago at [facility] she has been unable to get her dressing material but has completed a course of doxycycline. 6/7/ 2017 -- lower extremity venous duplex reflux evaluation was done oo No evidence of SVT or DVT in the RLL. No venous incompetence in the RLL. No further vascular workup is indicated at this time. She was seen by Dr. [name], on 10/04/2015. She agreed with the plan of taking her to the OR for debridement and application of theraskin and would also take biopsies to rule out pyoderma gangrenosum. Follow-up note dated May 31 received and she was status post application of Theraskin to multiple ulcers around the right ankle. Pathology did not show evidence of malignancy or pyoderma gangrenosum. She would continue to see as in the wound clinic for further care and see Dr. Leland [name] as needed. The patient brought the biopsy report and it was consistent with stasis ulcer no evidence of malignancy and the comment was that there was some adjacent neovascularization, fibrosis and patchy perivascular chronic inflammation. 11/15/2015 -- today we applied her first application of Theraskin 11/30/15; TheraSkin #2 12/13/2015 -- she is having a lot of pain locally and is here for possible  application of a theraskin today. 01/16/2016 -- the patient has significant pain and has noticed despite in spite of all local care and oral pain medication. It is impossible to debride her in the office. 02/06/2016 -- I do not see any notes from Dr. Iran Planas( the patient has not made a call to the office know as she heard from them) and the only visit to recently was with her PCP Dr. Danella Penton -- I saw her on 01/16/2016 and prescribed 90 tablets of oxycodone 10 mg and did lab work and screening for HIV. the HIV was negative and hemoglobin was 6.3 with a WBC count of 14.9 and hematocrit of 17.8 with platelets of 561. 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 The bimalleolar ulcers to the right lower extremity have been present for approximately 2 years, the left medial malleolus ulcer has been present approximately 6 months. She has followed with Dr.Thimmappa in the past and has had a total of 3 applications of Theraskin (01/2015, 09/2015, 06/17/16). She has also followed with Dr. Con Memos here in the clinic and has received 2 applications of TheraSkin (11/10/15, 11/30/15). The patient does experience chronic, and is not amenable to debridement. She had  a sickle cell crisis in December 2017, prior to that has been several years. She is not currently on any antibiotic therapy and has not been treated with any recently. 07/17/2016 -- was seen by Dr. Iran Planas of plastic surgery who saw her 2 [name] postop application of Theraskin #3. She had removed her dressing and asked her to apply silver alginate on alternate days and follow-up back with the wound center. Future debridements and application of skin substitute would have to be done in the hospital due to her high risk for anesthesia. READMISSION 04/17/2021 Patient is now a 51 year old woman that we have had in this clinic for a prolonged  period of time and 2016-2017 and then again for 2 visits in February 2018. At that point she had wounds on the right lower leg predominantly medial. She had also been seen by plastic surgery Dr. Leland [name] who I believe took her to the OR for operative debridement and application of TheraSkin in 2017. After she left our clinic she was followed for a very prolonged period of time in the wound care center in [facility] who then referred her ultimately to [facility] where she was seen by Dr. Vernona Rieger. Again taken her to the OR for skin grafting which apparently did not take. She had multiple other attempts at dressings although I have not really looked over all of these notes in great detail. She has not been seen in a wound care center in about a year. She states over the last year in addition to her right lower leg she has developed wounds on the left lower leg quite extensive. She is using Xeroform to all of these wounds without really any improvement. She also has Medicaid which does not cover wound products. The patient has had vascular work-ups in the past including most recently on 03/28/2021 showing biphasic waveforms on the right triphasic at the PTA and biphasic at the dorsalis pedis on the left. She was unable to tolerate any degree of compression to do ABIs. Unfortunately TBI's were also not done. She had venous reflux studies done in 2017. This did not show any evidence of a DVT or SVT and no venous incompetence was noted in the right leg at the time this was the only side with the wound As noted I did not look all over her old records. She apparently had a course of HBO and [facility] although I am not sure what the indication would have been. In any case she developed seizures and terminated treatment earlier. She is generally much more disabled than when we last saw her in clinic. She can no longer walk pretty much wheelchair-bound because predominantly of pain in the left hip. 04/24/2021; the patient  tolerated the wraps we put on. We used Santyl and Hydrofera Blue under compression. I brought her back for a nurse visit for a change in dressing. With Medicaid we will have a hard time getting anything paid for and hence the need for compression. She arrives in clinic with all the wounds looking somewhat better in terms of surface 12/20; circumferential wound on the right from the lateral to the medial. She has open areas on the left medial and left lateral x2 on all of this with the same surface. This does not look completely healthy although she does have some epithelialization. She is not complaining of a lot of pain which is unusual for her sickle ulcers. I have not looked over her extensive records from [facility]. She had recent arterial  studies and has a history of venous reflux studies I will need to look these over although I do not believe she has significant arterial disease 2023 05/22/2021; patient's wound areas measure slightly smaller. Still a lot of drainage coming from the right we have been using Hydrofera Blue and Santyl with some improvement in the wound surfaces. She tells me she will be getting transfused later in the week for her underlying sickle cell anemia I have looked over her recent arterial studies which were done in the fall. This was in November and showed biphasic and triphasic waveforms but she could not tolerate ABIs because of pressure and unfortunately TBI's were not done. She has not had recent venous reflux studies that I can see 1/10; not much change about the same surface area. This has a yellowish surface to it very gritty. We have been using Santyl and Hydrofera Blue for a prolonged period. Culture I did last week showed methicillin sensitive staph aureus "rare". Our intake nurse reports greenish drainage which may be the Hydrofera Blue itself 1/17; wounds are continue to measure smaller although I am not sure about the accuracy here. Especially the areas on the  right are covered in what looks to be a nonviable surface although she does have some epithelialization. Similarly she has areas on the left medial and left lateral ankle area which appear to have a better surface and perhaps are slightly smaller. We have been using Santyl and Hydrofera Blue. She cannot tolerate mechanical debridements She went for her reflux studies which showed significant reflux at the greater saphenous vein at the saphenofemoral junction as well as the greater saphenous vein in the proximal calf on the left she had reflux in the thigh and the common femoral vein and supra vein Fishel vein reflux in the greater saphenous vein. I will have vein and vascular look at this. My thoughts have been that these are likely sickle wounds. I looked through her old records from [facility] wound care center and then when she graduated to [facility] wound care center where she saw Dr. Zigmund Daniel and Dr. Vernona Rieger. Although I can see she had reflux studies done I do not see that she actually saw a vein and vascular. I went over the fact that she had operative debridements and actual skin grafting that did not take. I do not think these wounds have ever really progressed towards healing 1/31;Substantial wounds on the right ankle area. Hyper granulated very gritty adherent debris on the surface. 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 On the left she has no DVT or SVT deep vein reflux in the common femoral vein. Superficial vein reflux in the greater saphenous vein on although the vein diameter is not really all that large. I do not think  there is anything that can be done with these although I am going to send her for consultation to vein and vascular. Objective Constitutional Sitting or standing Blood Pressure is within target range for patient.. Pulse regular and within target range for patient.Marland Kitchen Respirations regular, non-labored and within target range.. Temperature is normal and within the target  range for the patient.Marland Kitchen Appears in no distress. Vitals Time Taken: 10:12 AM, Height: 67 in, Temperature: 97.8 F, Pulse: 88 bpm, Respiratory Rate: 16 breaths/min, Blood Pressure: 139/70 mmHg. General Notes: Wound exam; right lower leg wound hyper granulated with a completely nonviable surface. This would take a substantial debridement I could not get her through this in this clinic. oo The area on the left medial ankle has a similar surface I used an open curette to debride this I was able to get her through this 15-second debridement removing subcutaneous tissue attempting to get to a more viable surface. Hemostasis with direct pressure Integumentary (Hair, Skin) Wound #17 status is Open. Original cause of wound was Gradually Appeared. The date acquired was: 10/05/2012. The wound has been in treatment 9 [name]. The wound is located on the Right,Circumferential Lower Leg. The wound measures 10.8cm length x 16.4cm width x 0.1cm depth; 139.11cm^2 area and 13.911cm^3 volume. There is Fat Layer (Subcutaneous Tissue) exposed. There is no tunneling or undermining noted. There is a large amount of purulent drainage noted. The wound margin is distinct with the outline attached to the wound base. There is medium (34-66%) pink granulation within the wound bed. There is a medium (34-66%) amount of necrotic tissue within the wound bed including Adherent Slough. Wound #18 status is Open. Original cause of wound was Gradually Appeared. The date acquired was: 12/25/2020. The wound has been in treatment 9 [name]. The wound is located on the Left,Posterior  Ankle. The wound measures 3cm length x 1cm width x 0.1cm depth; 2.356cm^2 area and 0.236cm^3 volume. There is Fat Layer (Subcutaneous Tissue) exposed. There is no tunneling or undermining noted. There is a medium amount of serosanguineous drainage noted. The wound margin is distinct with the outline attached to the wound base. There is large (67-100%) pink granulation within the wound bed. There is a small (1-33%) amount of necrotic tissue within the wound bed including Adherent Slough. Wound #19 status is Open. Original cause of wound was Gradually Appeared. The date acquired was: 12/25/2020. The wound has been in treatment 9 [name]. The wound is located on the Left,Medial Ankle. The wound measures 3.2cm length x 1.5cm width x 0.1cm depth; 3.77cm^2 area and 0.377cm^3 volume. There is Fat Layer (Subcutaneous Tissue) exposed. There is no tunneling or undermining noted. There is a medium amount of serosanguineous drainage noted. The wound margin is distinct with the outline attached to the wound base. There is medium (34-66%) pink, hyper - granulation within the wound bed. There is a medium (34-66%) amount of necrotic tissue within the wound bed including Adherent Slough. Wound #20 status is Healed - Epithelialized. Original cause of wound was Gradually Appeared. The date acquired was: 12/25/2020. The wound has been in treatment 9 [name]. The wound is located on the Left,Lateral Ankle. The wound measures 0cm length x 0cm width x 0cm depth; 0cm^2 area and 0cm^3 volume. 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 There was a Excisional Skin/Subcutaneous Tissue Debridement with a total area  of 4.8 sq cm performed by Ricard Dillon., MD. With the following instrument(s): Curette to remove Non-Viable tissue/material. Material removed includes Subcutaneous Tissue and Slough and after achieving pain control using Other (Benzocaine). No specimens  were taken. A time out was conducted at 10:49, prior to the start of the procedure. A Minimum amount of bleeding was controlled with Pressure. The procedure was tolerated well. Post Debridement Measurements: 3.2cm length x 1.5cm width x 0.1cm depth; 0.377cm^3 volume. Character of Wound/Ulcer Post Debridement is stable. Post procedure Diagnosis Wound #19: Same as Pre-Procedure Plan Follow-up Appointments: Return Appointment in 1 week. - Dr. Dellia Nims Other: - Has [facility] appt 06/29/21 for venous reflux Bathing/ Shower/ Hygiene: May shower with protection but do not get wound dressing(s) wet. - Can get cast protector bags at [facility] or [facility] Additional Orders / Instructions: Follow Nutritious Diet WOUND #17: - Lower Leg Wound Laterality: Right, Circumferential Cleanser: Soap and Water 2 x Per Week/30 Days Discharge Instructions: May shower and wash wound with dial antibacterial soap and water prior to dressing change. Cleanser: Wound Cleanser 2 x Per Week/30 Days Discharge Instructions: Cleanse the wound with wound cleanser prior to applying a clean dressing using gauze sponges, not tissue or cotton balls. Peri-Wound Care: Triamcinolone 15 (g) 2 x Per Week/30 Days Discharge Instructions: Use triamcinolone 15 (g) as directed Peri-Wound Care: Sween Lotion (Moisturizing lotion) 2 x Per Week/30 Days Discharge Instructions: Apply moisturizing lotion as directed Topical: Antibiotic Ointment 2 x Per Week/30 Days Prim Dressing: Hydrofera Blue Classic Foam, 4x4 in 2 x Per Week/30 Days ary Discharge Instructions: Moisten with saline prior to applying to wound bed Secondary Dressing: ABD Pad, 5x9 2 x Per Week/30 Days Discharge Instructions: Apply over  primary dressing as directed. Com pression Wrap: Kerlix Roll 4.5x3.1 (in/yd) 2 x Per Week/30 Days Discharge Instructions: Apply Kerlix and Coban compression as directed. Com pression Wrap: Coban Self-Adherent Wrap 4x5 (in/yd) 2 x Per Week/30 Days Discharge Instructions: Apply over Kerlix as directed. WOUND #18: - Ankle Wound Laterality: Left, Posterior Cleanser: Soap and Water 2 x Per Week/30 Days Discharge Instructions: May shower and wash wound with dial antibacterial soap and water prior to dressing change. Cleanser: Wound Cleanser 2 x Per Week/30 Days Discharge Instructions: Cleanse the wound with wound cleanser prior to applying a clean dressing using gauze sponges, not tissue or cotton balls. Peri-Wound Care: Triamcinolone 15 (g) 2 x Per Week/30 Days Discharge Instructions: Use triamcinolone 15 (g) as directed Peri-Wound Care: Sween Lotion (Moisturizing lotion) 2 x Per Week/30 Days Discharge Instructions: Apply moisturizing lotion as directed Topical: Antibiotic Ointment 2 x Per Week/30 Days Prim Dressing: Hydrofera Blue Classic Foam, 4x4 in 2 x Per Week/30 Days ary Discharge Instructions: Moisten with saline prior to applying to wound bed Secondary Dressing: ABD Pad, 5x9 2 x Per Week/30 Days Discharge Instructions: Apply over primary dressing as directed. Com pression Wrap: Kerlix Roll 4.5x3.1 (in/yd) 2 x Per Week/30 Days Discharge Instructions: Apply Kerlix and Coban compression as directed. Com pression Wrap: Coban Self-Adherent Wrap 4x5 (in/yd) 2 x Per Week/30 Days Discharge Instructions: Apply over Kerlix as directed. WOUND #19: - Ankle Wound Laterality: Left, Medial Cleanser: Soap and Water 2 x Per Week/30 Days Discharge Instructions: May shower and wash wound with dial antibacterial soap and water prior to dressing change. Cleanser: Wound Cleanser 2 x Per Week/30 Days Discharge Instructions: Cleanse the wound with wound cleanser prior to applying a clean dressing using gauze  sponges, not tissue or cotton balls. Peri-Wound Care: Triamcinolone 15 (g) 2 x Per Week/30 Days Discharge Instructions: Use triamcinolone 15 (g) as directed Peri-Wound Care: Sween Lotion (Moisturizing lotion) 2 x Per Week/30 Days Discharge Instructions: Apply moisturizing lotion as directed Topical: Antibiotic Ointment 2  x Per Week/30 Days Prim Dressing: Hydrofera Blue Classic Foam, 4x4 in 2 x Per Week/30 Days ary Discharge Instructions: Moisten with saline prior to applying to wound bed Secondary Dressing: ABD Pad, 5x9 2 x Per Week/30 Days Discharge Instructions: Apply over primary dressing as directed. Com pression Wrap: Kerlix Roll 4.5x3.1 (in/yd) 2 x Per Week/30 Days Discharge Instructions: Apply Kerlix and Coban compression as directed. Com pression Wrap: Coban Self-Adherent Wrap 4x5 (in/yd) 2 x Per Week/30 Days Discharge Instructions: Apply over Kerlix as directed. 1. I continued with the Hydrofera Blue topical antibiotics 2. I was able to debride the left medial ankle with some difficulty. I will see if I can get this to close or at least get smaller. 3. I am going to ask for a consult from vein and vascular although I really do not believe that the reflux we documented was significant 4. I do not believe she has an arterial issue Electronic Signature(s) Signed: 06/19/2021 4:28:46 PM By: Linton Ham MD Entered By: Linton Ham on 06/19/2021 11:49:34 -------------------------------------------------------------------------------- SuperBill Details Patient Name: Date of Service: [place], [name] 06/19/2021 Medical Record Number: [id number] Patient Account Number: [id number] Date of Birth/Sex: Treating RN: 14-Jun-1970 (51 y.o. [name] Primary Care Provider: [name] Other Clinician: Referring Provider: Treating Provider/Extender: [name] in Treatment: 9 Diagnosis Coding ICD-10 Codes Code Description [id number] Non-pressure  chronic ulcer of other part of right lower leg with other specified severity L97.828 Non-pressure chronic ulcer of other part of left lower leg with other specified severity 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 Facility Procedures CPT4 Code: [id number] Description: 93790 - DEB SUBQ TISSUE 20 SQ CM/< ICD-10 Diagnosis Description L97.828 Non-pressure chronic ulcer of other part of left lower leg with other specified Modifier: severity Quantity: 1 Physician Procedures : CPT4 Code Description Modifier [id number] - WC PHYS SUBQ TISS 20 SQ CM ICD-10 Diagnosis Description L97.828 Non-pressure chronic ulcer of other part of left lower leg with other specified severity Quantity: 1 Electronic Signature(s) Signed: 06/19/2021 4:28:46 PM By: Linton Ham MD Previous Signature: 06/19/2021 11:45:54 AM Version By: Lorrin Jackson Entered By: Linton Ham on 06/19/2021 11:49:49

## 2021-06-19 NOTE — Progress Notes (Signed)
Orders Placed This Encounter  Procedures   CBC with Differential    Standing Status:   Future    Standing Expiration Date:   06/19/2022   Comprehensive metabolic panel    Standing Status:   Future    Standing Expiration Date:   06/19/2022

## 2021-06-20 ENCOUNTER — Other Ambulatory Visit: Payer: Self-pay | Admitting: Family Medicine

## 2021-06-20 ENCOUNTER — Telehealth (HOSPITAL_COMMUNITY): Payer: Self-pay

## 2021-06-20 DIAGNOSIS — D571 Sickle-cell disease without crisis: Secondary | ICD-10-CM

## 2021-06-20 LAB — COMPREHENSIVE METABOLIC PANEL
ALT: 26 IU/L (ref 0–32)
AST: 40 IU/L (ref 0–40)
Albumin/Globulin Ratio: 1.2 (ref 1.2–2.2)
Albumin: 4.3 g/dL (ref 3.8–4.8)
Alkaline Phosphatase: 70 IU/L (ref 44–121)
BUN/Creatinine Ratio: 17 (ref 9–23)
BUN: 8 mg/dL (ref 6–24)
Bilirubin Total: 3.3 mg/dL — ABNORMAL HIGH (ref 0.0–1.2)
CO2: 21 mmol/L (ref 20–29)
Calcium: 9.5 mg/dL (ref 8.7–10.2)
Chloride: 104 mmol/L (ref 96–106)
Creatinine, Ser: 0.46 mg/dL — ABNORMAL LOW (ref 0.57–1.00)
Globulin, Total: 3.7 g/dL (ref 1.5–4.5)
Glucose: 74 mg/dL (ref 70–99)
Potassium: 4.5 mmol/L (ref 3.5–5.2)
Sodium: 140 mmol/L (ref 134–144)
Total Protein: 8 g/dL (ref 6.0–8.5)
eGFR: 117 mL/min/{1.73_m2} (ref >59)

## 2021-06-20 LAB — CBC WITH DIFFERENTIAL/PLATELET
Basophils Absolute: 0.2 10*3/uL (ref 0.0–0.2)
Basos: 1 %
EOS (ABSOLUTE): 0.8 10*3/uL — ABNORMAL HIGH (ref 0.0–0.4)
Eos: 6 %
Hematocrit: 20.8 % — ABNORMAL LOW (ref 34.0–46.6)
Hemoglobin: 7.1 g/dL — ABNORMAL LOW (ref 11.1–15.9)
Immature Grans (Abs): 0.1 10*3/uL (ref 0.0–0.1)
Immature Granulocytes: 1 %
Lymphocytes Absolute: 2.7 10*3/uL (ref 0.7–3.1)
Lymphs: 19 %
MCH: 28.6 pg (ref 26.6–33.0)
MCHC: 34.1 g/dL (ref 31.5–35.7)
MCV: 84 fL (ref 79–97)
Monocytes Absolute: 1.5 10*3/uL — ABNORMAL HIGH (ref 0.1–0.9)
Monocytes: 11 %
NRBC: 3 % — ABNORMAL HIGH (ref 0–0)
Neutrophils Absolute: 8.6 10*3/uL — ABNORMAL HIGH (ref 1.4–7.0)
Neutrophils: 62 %
Platelets: 378 10*3/uL (ref 150–450)
RBC: 2.48 x10E6/uL — CL (ref 3.77–5.28)
RDW: 17.4 % — ABNORMAL HIGH (ref 11.7–15.4)
WBC: 13.8 10*3/uL — ABNORMAL HIGH (ref 3.4–10.8)

## 2021-06-20 NOTE — Telephone Encounter (Signed)
Spoke with pt informing her that provider [place] Hollis FNP wants her to get a blood transfusion this week at the patient care center. Hgb 7.1. Pt verbalized understanding and states she can come tomorrow for transfusion. Pt is unable to come for T/S on Wednesday due to transportation issues. Pt scheduled for 06/21/21 at 8:00am, verbalized understanding.

## 2021-06-20 NOTE — Progress Notes (Signed)
[name], PALLESCHI ([id number]) Visit Report for 06/19/2021 Arrival Information Details Patient Name: Date of Service: [name] 06/19/2021 10:00 A M Medical Record Number: [id number] Patient Account Number: [id number] Date of Birth/Sex: Treating RN: 1971-03-27 (51 y.o. [name] Primary Care Nate Common: [name] Other Clinician: Referring Jasara Corrigan: Treating Pria Klosinski/Extender: Bernette Redbird in Treatment: 9 Visit Information History Since Last Visit Added or deleted any medications: No Patient Arrived: Wheel Chair Any new allergies or adverse reactions: No Arrival Time: 10:11 Had a fall or experienced change in No Accompanied By: self activities of daily living that may affect Transfer Assistance: None risk of falls: Patient Identification Verified: Yes Signs or symptoms of abuse/neglect since last visito No Secondary Verification Process Completed: Yes Hospitalized since last visit: No Patient Requires Transmission-Based Precautions: No Implantable device outside of the clinic excluding No Patient Has Alerts: No cellular tissue based products placed in the center since last visit: Has Dressing in Place as Prescribed: Yes Pain Present Now: Yes Electronic Signature(s) Signed: 06/20/2021 9:58:51 AM By: Sandre Kitty Entered By: Sandre Kitty on 06/19/2021 10:12:02 -------------------------------------------------------------------------------- Encounter Discharge Information Details Patient Name: Date of Service: [name] [name], [name] 06/19/2021 10:00 A M Medical Record Number: [id number] Patient Account Number: [id number] Date of Birth/Sex: Treating RN: Apr 22, 1971 (51 y.o. Sue Lush Primary Care Antonio Creswell: [name] Other Clinician: Referring Lou Irigoyen: Treating Vadhir Mcnay/Extender: Bernette Redbird in Treatment: 9 Encounter Discharge Information Items Post Procedure Vitals Discharge Condition:  Stable Temperature (F): 97.8 Ambulatory Status: Wheelchair Pulse (bpm): 88 Discharge Destination: Home Respiratory Rate (breaths/min): 16 Transportation: Other Blood Pressure (mmHg): 139/70 Schedule Follow-up Appointment: Yes Clinical Summary of Care: Provided on 06/19/2021 Form Type Recipient Paper Patient Patient Electronic Signature(s) Signed: 06/19/2021 11:34:07 AM By: Lorrin Jackson Entered By: Lorrin Jackson on 06/19/2021 11:34:07 -------------------------------------------------------------------------------- Lower Extremity Assessment Details Patient Name: Date of Service: [name] [name], [name] 06/19/2021 10:00 A M Medical Record Number: [id number] Patient Account Number: [id number] Date of Birth/Sex: Treating RN: 1971/03/14 (51 y.o. Sue Lush Primary Care Jimel Myler: [name] Other Clinician: Referring Harshita Bernales: Treating Samoria Fedorko/Extender: Farrel Demark Weeks in Treatment: 9 Edema Assessment Assessed: Shirlyn Goltz: Yes] Patrice Paradise: Yes] Edema: [Left: No] [Right: No] Calf Left: Right: Point of Measurement: 33 cm From Medial Instep 24 cm 27 cm Ankle Left: Right: Point of Measurement: 10 cm From Medial Instep 17 cm 20 cm Vascular Assessment Pulses: Dorsalis Pedis Palpable: [Left:Yes] [Right:Yes] Electronic Signature(s) Signed: 06/19/2021 4:25:26 PM By: Lorrin Jackson Entered By: Lorrin Jackson on 06/19/2021 10:31:38 -------------------------------------------------------------------------------- Multi Wound Chart Details Patient Name: Date of Service: [name] [name], [name] 06/19/2021 10:00 A M Medical Record Number: [id number] Patient Account Number: [id number] Date of Birth/Sex: Treating RN: March 03, 1971 (51 y.o. [name] Primary Care Mikeila Burgen: [name] Other Clinician: Referring Shante Maysonet: Treating Alexiss Iturralde/Extender: Farrel Demark Weeks in Treatment: 9 Vital Signs Height(in): 67 Pulse(bpm):  88 Weight(lbs): Blood Pressure(mmHg): 139/70 Body Mass Index(BMI): Temperature(F): 97.8 Respiratory Rate(breaths/min): 16 Photos: [17:Right, Circumferential Lower Leg] [18:Left, Posterior Ankle] [19:Left, Medial Ankle] Wound Location: [17:Gradually Appeared] [18:Gradually Appeared] [19:Gradually Appeared] Wounding Event: [17:Sickle Cell Lesion] [18:Sickle Cell Lesion] [19:Sickle Cell Lesion] Primary Etiology: [17:Anemia, Sickle Cell Disease,] [18:Anemia, Sickle Cell Disease,] [19:Anemia, Sickle Cell Disease,] Comorbid History: [17:Neuropathy 10/05/2012] [18:Neuropathy 12/25/2020] [19:Neuropathy 12/25/2020] Date Acquired: [17:9] [18:9] [19:9] Weeks of Treatment: [17:Open] [18:Open] [19:Open] Wound Status: [17:No] [18:No] [19:No] Wound Recurrence: [17:Yes] [18:No] [19:No] Clustered Wound: [17:10.8x16.4x0.1] [18:3x1x0.1] [19:3.2x1.5x0.1] Measurements L x W x D (cm) [17:139.11] [18:2.356] [19:3.77]  A (cm) : rea [17:13.911] [18:0.236] [19:0.377] Volume (cm) : [17:26.00%] [18:83.30%] [19:82.20%] % Reduction in A [17:rea: 26.00%] [18:94.40%] [19:97.50%] % Reduction in Volume: [17:Full Thickness Without Exposed] [18:Full Thickness Without Exposed] [19:Full Thickness Without Exposed] Classification: [17:Support Structures Large] [18:Support Structures Medium] [19:Support Structures Medium] Exudate A mount: [17:Purulent] [18:Serosanguineous] [19:Serosanguineous] Exudate Type: [17:yellow, brown, green] [18:red, brown] [19:red, brown] Exudate Color: [17:Distinct, outline attached] [18:Distinct, outline attached] [19:Distinct, outline attached] Wound Margin: [17:Medium (34-66%)] [18:Large (67-100%)] [19:Medium (34-66%)] Granulation A mount: [17:Pink] [18:Pink] [19:Pink, Hyper-granulation] Granulation Quality: [17:Medium (34-66%)] [18:Small (1-33%)] [19:Medium (34-66%)] Necrotic A mount: [17:Fat Layer (Subcutaneous Tissue): Yes Fat Layer (Subcutaneous Tissue): Yes Fat Layer (Subcutaneous Tissue):  Yes] Exposed Structures: [17:Fascia: No Tendon: No Muscle: No Joint: No Bone: No Small (1-33%)] [18:Fascia: No Tendon: No Muscle: No Joint: No Bone: No Medium (34-66%)] [19:Fascia: No Tendon: No Muscle: No Joint: No Bone: No Medium (34-66%)] Epithelialization: [17:N/A] [18:N/A] [19:Debridement - Excisional] Debridement: Pre-procedure Verification/Time Out N/A [18:N/A] [19:10:49] Taken: [17:N/A] [18:N/A] [19:Other] Pain Control: [17:N/A] [18:N/A] [19:Subcutaneous, Slough] Tissue Debrided: [17:N/A] [18:N/A] [19:Skin/Subcutaneous Tissue] Level: [17:N/A] [18:N/A] [19:4.8] Debridement A (sq cm): [17:rea N/A] [18:N/A] [19:Curette] Instrument: [17:N/A] [18:N/A] [19:Minimum] Bleeding: [17:N/A] [18:N/A] [19:Pressure] Hemostasis A chieved: [17:N/A] [18:N/A] [19:Procedure was tolerated well] Debridement Treatment Response: [17:N/A] [18:N/A] [19:3.2x1.5x0.1] Post Debridement Measurements L x W x D (cm) [17:N/A] [18:N/A] [19:0.377] Post Debridement Volume: (cm) [17:N/A] [18:N/A] [19:Debridement] Wound Number: 20 N/A N/A Photos: N/A N/A Left, Lateral Ankle N/A N/A Wound Location: Gradually Appeared N/A N/A Wounding Event: Sickle Cell Lesion N/A N/A Primary Etiology: Anemia, Sickle Cell Disease, N/A N/A Comorbid History: Neuropathy 12/25/2020 N/A N/A Date Acquired: 9 N/A N/A Weeks of Treatment: Healed - Epithelialized N/A N/A Wound Status: No N/A N/A Wound Recurrence: No N/A N/A Clustered Wound: 0x0x0 N/A N/A Measurements L x W x D (cm) 0 N/A N/A A (cm) : rea 0 N/A N/A Volume (cm) : 100.00% N/A N/A % Reduction in Area: 100.00% N/A N/A % Reduction in Volume: Full Thickness Without Exposed N/A N/A Classification: Support Structures N/A N/A N/A Exudate A mount: N/A N/A N/A Exudate Type: N/A N/A N/A Exudate Color: N/A N/A N/A Wound Margin: N/A N/A N/A Granulation A mount: N/A N/A N/A Granulation Quality: N/A N/A N/A Necrotic A mount: N/A N/A N/A Exposed  Structures: N/A N/A N/A Epithelialization: N/A N/A N/A Debridement: N/A N/A N/A Pain Control: N/A N/A N/A Tissue Debrided: N/A N/A N/A Level: N/A N/A N/A Debridement A (sq cm): rea N/A N/A N/A Instrument: N/A N/A N/A Bleeding: N/A N/A N/A Hemostasis A chieved: N/A N/A N/A Debridement Treatment Response: N/A N/A N/A Post Debridement Measurements L x W x D (cm) N/A N/A N/A Post Debridement Volume: (cm) N/A N/A N/A Procedures Performed: Treatment Notes Wound #17 (Lower Leg) Wound Laterality: Right, Circumferential Cleanser Soap and Water Discharge Instruction: May shower and wash wound with dial antibacterial soap and water prior to dressing change. Wound Cleanser Discharge Instruction: Cleanse the wound with wound cleanser prior to applying a clean dressing using gauze sponges, not tissue or cotton balls. Peri-Wound Care Triamcinolone 15 (g) Discharge Instruction: Use triamcinolone 15 (g) as directed Sween Lotion (Moisturizing lotion) Discharge Instruction: Apply moisturizing lotion as directed Topical Antibiotic Ointment Primary Dressing Hydrofera Blue Classic Foam, 4x4 in Discharge Instruction: Moisten with saline prior to applying to wound bed Secondary Dressing ABD Pad, 5x9 Discharge Instruction: Apply over primary dressing as directed. Secured With Compression Wrap Kerlix Roll 4.5x3.1 (in/yd) Discharge Instruction: Apply Kerlix and Coban compression as directed. Coban Self-Adherent Wrap 4x5 (  in/yd) Discharge Instruction: Apply over Kerlix as directed. Compression Stockings Add-Ons Wound #18 (Ankle) Wound Laterality: Left, Posterior Cleanser Soap and Water Discharge Instruction: May shower and wash wound with dial antibacterial soap and water prior to dressing change. Wound Cleanser Discharge Instruction: Cleanse the wound with wound cleanser prior to applying a clean dressing using gauze sponges, not tissue or cotton balls. Peri-Wound  Care Triamcinolone 15 (g) Discharge Instruction: Use triamcinolone 15 (g) as directed Sween Lotion (Moisturizing lotion) Discharge Instruction: Apply moisturizing lotion as directed Topical Antibiotic Ointment Primary Dressing Hydrofera Blue Classic Foam, 4x4 in Discharge Instruction: Moisten with saline prior to applying to wound bed Secondary Dressing ABD Pad, 5x9 Discharge Instruction: Apply over primary dressing as directed. Secured With Compression Wrap Kerlix Roll 4.5x3.1 (in/yd) Discharge Instruction: Apply Kerlix and Coban compression as directed. Coban Self-Adherent Wrap 4x5 (in/yd) Discharge Instruction: Apply over Kerlix as directed. Compression Stockings Add-Ons Wound #19 (Ankle) Wound Laterality: Left, Medial Cleanser Soap and Water Discharge Instruction: May shower and wash wound with dial antibacterial soap and water prior to dressing change. Wound Cleanser Discharge Instruction: Cleanse the wound with wound cleanser prior to applying a clean dressing using gauze sponges, not tissue or cotton balls. Peri-Wound Care Triamcinolone 15 (g) Discharge Instruction: Use triamcinolone 15 (g) as directed Sween Lotion (Moisturizing lotion) Discharge Instruction: Apply moisturizing lotion as directed Topical Antibiotic Ointment Primary Dressing Hydrofera Blue Classic Foam, 4x4 in Discharge Instruction: Moisten with saline prior to applying to wound bed Secondary Dressing ABD Pad, 5x9 Discharge Instruction: Apply over primary dressing as directed. Secured With Compression Wrap Kerlix Roll 4.5x3.1 (in/yd) Discharge Instruction: Apply Kerlix and Coban compression as directed. Coban Self-Adherent Wrap 4x5 (in/yd) Discharge Instruction: Apply over Kerlix as directed. Compression Stockings Add-Ons Electronic Signature(s) Signed: 06/19/2021 4:28:46 PM By: Linton Ham MD Signed: 06/19/2021 5:37:15 PM By: Levan Hurst RN, BSN Entered By: Linton Ham on 06/19/2021  11:29:25 -------------------------------------------------------------------------------- Multi-Disciplinary Care Plan Details Patient Name: Date of Service: [name] [name], [name] 06/19/2021 10:00 A M Medical Record Number: [id number] Patient Account Number: [id number] Date of Birth/Sex: Treating RN: 1970/09/01 (51 y.o. Sue Lush Primary Care Ersilia Brawley: [name] Other Clinician: Referring Sudeep Scheibel: Treating Yoav Okane/Extender: Farrel Demark Weeks in Treatment: 9 Active Inactive Wound/Skin Impairment Nursing Diagnoses: Impaired tissue integrity Goals: Patient/caregiver will verbalize understanding of skin care regimen Date Initiated: 04/17/2021 Target Resolution Date: 06/26/2021 Goal Status: Active Ulcer/skin breakdown will have a volume reduction of 30% by week 4 Date Initiated: 04/17/2021 Date Inactivated: 05/29/2021 Target Resolution Date: 05/15/2021 Goal Status: Met Ulcer/skin breakdown will have a volume reduction of 50% by week 8 Date Initiated: 05/29/2021 Target Resolution Date: 06/26/2021 Goal Status: Active Interventions: Assess patient/caregiver ability to obtain necessary supplies Assess patient/caregiver ability to perform ulcer/skin care regimen upon admission and as needed Assess ulceration(s) every visit Provide education on ulcer and skin care Treatment Activities: Topical wound management initiated : 04/17/2021 Notes: 06/08/21: Left leg wounds greater than 30% volume reduction, right leg acute infection. Electronic Signature(s) Signed: 06/19/2021 10:37:25 AM By: Lorrin Jackson Entered By: Lorrin Jackson on 06/19/2021 10:37:24 -------------------------------------------------------------------------------- Pain Assessment Details Patient Name: Date of Service: [name] [name], [name] 06/19/2021 10:00 A M Medical Record Number: [id number] Patient Account Number: [id number] Date of Birth/Sex: Treating RN: 01/25/1971 (51 y.o. [name] Primary Care Micco Bourbeau: [name] Other Clinician: Referring Zenaya Ulatowski: Treating Anquanette Bahner/Extender: Farrel Demark Weeks in Treatment: 9 Active Problems Location of Pain Severity and Description of Pain Patient Has Paino Yes Site Locations  Rate the pain. Current Pain Level: 4 Pain Management and Medication Current Pain Management: Electronic Signature(s) Signed: 06/19/2021 5:37:15 PM By: Levan Hurst RN, BSN Signed: 06/20/2021 9:58:51 AM By: Sandre Kitty Entered By: Sandre Kitty on 06/19/2021 10:12:35 -------------------------------------------------------------------------------- Patient/Caregiver Education Details Patient Name: Date of Service: [name] [name], [name] 1/31/2023andnbsp10:00 Miranda Record Number: [id number] Patient Account Number: [id number] Date of Birth/Gender: Treating RN: [birth year] (51 y.o. Sue Lush Primary Care Physician: [name] Other Clinician: Referring Physician: Treating Physician/Extender: Bernette Redbird in Treatment: 9 Education Assessment Education Provided To: Patient Education Topics Provided Venous: Methods: Explain/Verbal, Printed Responses: State content correctly Wound/Skin Impairment: Methods: Explain/Verbal, Printed Responses: State content correctly Electronic Signature(s) Signed: 06/19/2021 4:25:26 PM By: Lorrin Jackson Entered By: Lorrin Jackson on 06/19/2021 10:37:47 -------------------------------------------------------------------------------- Wound Assessment Details Patient Name: Date of Service: [name] [name], [name] 06/19/2021 10:00 A M Medical Record Number: [id number] Patient Account Number: [id number] Date of Birth/Sex: Treating RN: 1970-08-01 (51 y.o. [name] Primary Care Anitria Andon: [name] Other Clinician: Referring Rechy Bost: Treating Daxon Kyne/Extender: Farrel Demark Weeks in Treatment: 9 Wound  Status Wound Number: 17 Primary Etiology: Sickle Cell Lesion Wound Location: Right, Circumferential Lower Leg Wound Status: Open Wounding Event: Gradually Appeared Comorbid History: Anemia, Sickle Cell Disease, Neuropathy Date Acquired: 10/05/2012 Weeks Of Treatment: 9 Clustered Wound: Yes Photos Wound Measurements Length: (cm) 10.8 Width: (cm) 16.4 Depth: (cm) 0.1 Area: (cm) 139.11 Volume: (cm) 13.911 % Reduction in Area: 26% % Reduction in Volume: 26% Epithelialization: Small (1-33%) Tunneling: No Undermining: No Wound Description Classification: Full Thickness Without Exposed Support Structures Wound Margin: Distinct, outline attached Exudate Amount: Large Exudate Type: Purulent Exudate Color: yellow, brown, green Foul Odor After Cleansing: No Slough/Fibrino Yes Wound Bed Granulation Amount: Medium (34-66%) Exposed Structure Granulation Quality: Pink Fascia Exposed: No Necrotic Amount: Medium (34-66%) Fat Layer (Subcutaneous Tissue) Exposed: Yes Necrotic Quality: Adherent Slough Tendon Exposed: No Muscle Exposed: No Joint Exposed: No Bone Exposed: No Treatment Notes Wound #17 (Lower Leg) Wound Laterality: Right, Circumferential Cleanser Soap and Water Discharge Instruction: May shower and wash wound with dial antibacterial soap and water prior to dressing change. Wound Cleanser Discharge Instruction: Cleanse the wound with wound cleanser prior to applying a clean dressing using gauze sponges, not tissue or cotton balls. Peri-Wound Care Triamcinolone 15 (g) Discharge Instruction: Use triamcinolone 15 (g) as directed Sween Lotion (Moisturizing lotion) Discharge Instruction: Apply moisturizing lotion as directed Topical Antibiotic Ointment Primary Dressing Hydrofera Blue Classic Foam, 4x4 in Discharge Instruction: Moisten with saline prior to applying to wound bed Secondary Dressing ABD Pad, 5x9 Discharge Instruction: Apply over primary dressing as  directed. Secured With Compression Wrap Kerlix Roll 4.5x3.1 (in/yd) Discharge Instruction: Apply Kerlix and Coban compression as directed. Coban Self-Adherent Wrap 4x5 (in/yd) Discharge Instruction: Apply over Kerlix as directed. Compression Stockings Add-Ons Electronic Signature(s) Signed: 06/19/2021 4:25:26 PM By: Lorrin Jackson Signed: 06/19/2021 5:37:15 PM By: Levan Hurst RN, BSN Entered By: Lorrin Jackson on 06/19/2021 10:32:06 -------------------------------------------------------------------------------- Wound Assessment Details Patient Name: Date of Service: [name] [name], [name] 06/19/2021 10:00 A M Medical Record Number: [id number] Patient Account Number: [id number] Date of Birth/Sex: Treating RN: [birth year] (51 y.o. [name] Primary Care Lurlie Wigen: [name] Other Clinician: Referring Chenay Nesmith: Treating Don Tiu/Extender: Farrel Demark Weeks in Treatment: 9 Wound Status Wound Number: 18 Primary Etiology: Sickle Cell Lesion Wound Location: Left, Posterior Ankle Wound Status: Open Wounding Event: Gradually Appeared Comorbid History: Anemia, Sickle Cell Disease, Neuropathy Date Acquired: 12/25/2020  Weeks Of Treatment: 9 Clustered Wound: No Photos Wound Measurements Length: (cm) 3 Width: (cm) 1 Depth: (cm) 0.1 Area: (cm) 2.356 Volume: (cm) 0.236 % Reduction in Area: 83.3% % Reduction in Volume: 94.4% Epithelialization: Medium (34-66%) Tunneling: No Undermining: No Wound Description Classification: Full Thickness Without Exposed Support Structures Wound Margin: Distinct, outline attached Exudate Amount: Medium Exudate Type: Serosanguineous Exudate Color: red, brown Foul Odor After Cleansing: No Slough/Fibrino Yes Wound Bed Granulation Amount: Large (67-100%) Exposed Structure Granulation Quality: Pink Fascia Exposed: No Necrotic Amount: Small (1-33%) Fat Layer (Subcutaneous Tissue) Exposed: Yes Necrotic Quality:  Adherent Slough Tendon Exposed: No Muscle Exposed: No Joint Exposed: No Bone Exposed: No Treatment Notes Wound #18 (Ankle) Wound Laterality: Left, Posterior Cleanser Soap and Water Discharge Instruction: May shower and wash wound with dial antibacterial soap and water prior to dressing change. Wound Cleanser Discharge Instruction: Cleanse the wound with wound cleanser prior to applying a clean dressing using gauze sponges, not tissue or cotton balls. Peri-Wound Care Triamcinolone 15 (g) Discharge Instruction: Use triamcinolone 15 (g) as directed Sween Lotion (Moisturizing lotion) Discharge Instruction: Apply moisturizing lotion as directed Topical Antibiotic Ointment Primary Dressing Hydrofera Blue Classic Foam, 4x4 in Discharge Instruction: Moisten with saline prior to applying to wound bed Secondary Dressing ABD Pad, 5x9 Discharge Instruction: Apply over primary dressing as directed. Secured With Compression Wrap Kerlix Roll 4.5x3.1 (in/yd) Discharge Instruction: Apply Kerlix and Coban compression as directed. Coban Self-Adherent Wrap 4x5 (in/yd) Discharge Instruction: Apply over Kerlix as directed. Compression Stockings Add-Ons Electronic Signature(s) Signed: 06/19/2021 4:25:26 PM By: Lorrin Jackson Signed: 06/19/2021 5:37:15 PM By: Levan Hurst RN, BSN Entered By: Lorrin Jackson on 06/19/2021 10:32:58 -------------------------------------------------------------------------------- Wound Assessment Details Patient Name: Date of Service: [name] [name], [name] 06/19/2021 10:00 A M Medical Record Number: [id number] Patient Account Number: [id number] Date of Birth/Sex: Treating RN: 1970-09-02 (51 y.o. [name] Primary Care Shean Gerding: [name] Other Clinician: Referring Caius Silbernagel: Treating Cynthis Purington/Extender: Farrel Demark Weeks in Treatment: 9 Wound Status Wound Number: 19 Primary Etiology: Sickle Cell Lesion Wound Location: Left, Medial  Ankle Wound Status: Open Wounding Event: Gradually Appeared Comorbid History: Anemia, Sickle Cell Disease, Neuropathy Date Acquired: 12/25/2020 Weeks Of Treatment: 9 Clustered Wound: No Photos Wound Measurements Length: (cm) 3.2 Width: (cm) 1.5 Depth: (cm) 0.1 Area: (cm) 3.77 Volume: (cm) 0.377 % Reduction in Area: 82.2% % Reduction in Volume: 97.5% Epithelialization: Medium (34-66%) Tunneling: No Undermining: No Wound Description Classification: Full Thickness Without Exposed Support Structures Wound Margin: Distinct, outline attached Exudate Amount: Medium Exudate Type: Serosanguineous Exudate Color: red, brown Foul Odor After Cleansing: No Slough/Fibrino Yes Wound Bed Granulation Amount: Medium (34-66%) Exposed Structure Granulation Quality: Pink, Hyper-granulation Fascia Exposed: No Necrotic Amount: Medium (34-66%) Fat Layer (Subcutaneous Tissue) Exposed: Yes Necrotic Quality: Adherent Slough Tendon Exposed: No Muscle Exposed: No Joint Exposed: No Bone Exposed: No Treatment Notes Wound #19 (Ankle) Wound Laterality: Left, Medial Cleanser Soap and Water Discharge Instruction: May shower and wash wound with dial antibacterial soap and water prior to dressing change. Wound Cleanser Discharge Instruction: Cleanse the wound with wound cleanser prior to applying a clean dressing using gauze sponges, not tissue or cotton balls. Peri-Wound Care Triamcinolone 15 (g) Discharge Instruction: Use triamcinolone 15 (g) as directed Sween Lotion (Moisturizing lotion) Discharge Instruction: Apply moisturizing lotion as directed Topical Antibiotic Ointment Primary Dressing Hydrofera Blue Classic Foam, 4x4 in Discharge Instruction: Moisten with saline prior to applying to wound bed Secondary Dressing ABD Pad, 5x9 Discharge Instruction: Apply over primary dressing as directed.  Secured With Compression Wrap Kerlix Roll 4.5x3.1 (in/yd) Discharge Instruction: Apply Kerlix and  Coban compression as directed. Coban Self-Adherent Wrap 4x5 (in/yd) Discharge Instruction: Apply over Kerlix as directed. Compression Stockings Add-Ons Electronic Signature(s) Signed: 06/19/2021 4:25:26 PM By: Lorrin Jackson Signed: 06/19/2021 5:37:15 PM By: Levan Hurst RN, BSN Entered By: Lorrin Jackson on 06/19/2021 10:33:35 -------------------------------------------------------------------------------- Wound Assessment Details Patient Name: Date of Service: [name] [name], [name] 06/19/2021 10:00 A M Medical Record Number: [id number] Patient Account Number: [id number] Date of Birth/Sex: Treating RN: 10-05-1970 (51 y.o. [name] Primary Care Keone Kamer: [name] Other Clinician: Referring Yeshua Stryker: Treating Elnoria Livingston/Extender: Farrel Demark Weeks in Treatment: 9 Wound Status Wound Number: 20 Primary Etiology: Sickle Cell Lesion Wound Location: Left, Lateral Ankle Wound Status: Healed - Epithelialized Wounding Event: Gradually Appeared Comorbid History: Anemia, Sickle Cell Disease, Neuropathy Date Acquired: 12/25/2020 Weeks Of Treatment: 9 Clustered Wound: No Photos Wound Measurements Length: (cm) Width: (cm) Depth: (cm) Area: (cm) Volume: (cm) 0 % Reduction in Area: 100% 0 % Reduction in Volume: 100% 0 0 0 Wound Description Classification: Full Thickness Without Exposed Support Structur es Electronic Signature(s) Signed: 06/19/2021 4:25:26 PM By: Lorrin Jackson Signed: 06/19/2021 5:37:15 PM By: Levan Hurst RN, BSN Entered By: Lorrin Jackson on 06/19/2021 10:34:34 -------------------------------------------------------------------------------- [place] Details Patient Name: Date of Service: [name] [name], [name] 06/19/2021 10:00 A M Medical Record Number: [id number] Patient Account Number: [id number] Date of Birth/Sex: Treating RN: 12/19/1970 (51 y.o. [name] Primary Care Johncharles Fusselman: [name] Other Clinician: Referring  Samanvi Cuccia: Treating Secret Kristensen/Extender: Farrel Demark Weeks in Treatment: 9 Vital Signs Time Taken: 10:12 Temperature (F): 97.8 Height (in): 67 Pulse (bpm): 88 Respiratory Rate (breaths/min): 16 Blood Pressure (mmHg): 139/70 Reference Range: 80 - 120 mg / dl Electronic Signature(s) Signed: 06/20/2021 9:58:51 AM By: Sandre Kitty Entered By: Sandre Kitty on 06/19/2021 10:12:19

## 2021-06-21 ENCOUNTER — Non-Acute Institutional Stay (HOSPITAL_COMMUNITY)
Admission: RE | Admit: 2021-06-21 | Discharge: 2021-06-21 | Disposition: A | Discharge status: Home / Self Care | Payer: Medicaid Other | Source: Ambulatory Visit | Attending: Internal Medicine | Admitting: Internal Medicine

## 2021-06-21 ENCOUNTER — Other Ambulatory Visit: Payer: Self-pay

## 2021-06-21 DIAGNOSIS — D571 Sickle-cell disease without crisis: Secondary | ICD-10-CM | POA: Insufficient documentation

## 2021-06-21 LAB — PREPARE RBC (CROSSMATCH)

## 2021-06-21 LAB — HEMOGLOBIN AND HEMATOCRIT, BLOOD
HCT: 21.2 % — ABNORMAL LOW (ref 36.0–46.0)
Hemoglobin: 7.2 g/dL — ABNORMAL LOW (ref 12.0–15.0)

## 2021-06-21 MED ORDER — SODIUM CHLORIDE 0.9% IV SOLUTION: Freq: Once | INTRAVENOUS | Status: AC

## 2021-06-21 MED ORDER — DIPHENHYDRAMINE HCL 25 MG PO CAPS
25.0000 mg | ORAL_CAPSULE | Freq: Once | ORAL | Status: AC
  Administered 2021-06-21: 25 mg via ORAL
  Filled 2021-06-21: qty 1

## 2021-06-21 MED ORDER — ACETAMINOPHEN 325 MG PO TABS
650.0000 mg | ORAL_TABLET | Freq: Once | ORAL | Status: AC
  Administered 2021-06-21: 650 mg via ORAL
  Filled 2021-06-21: qty 2

## 2021-06-21 NOTE — Progress Notes (Signed)
VASC[facility]           OF [place]  History and Physical   [name] is a 51 y.o. female with hx of sickle cell who has been followed by Dr. Dellia Nims for some time for non healing wounds.  He sent her for [name], which could not be obtained due to pain but did have biphasic and triphasic waveforms.  She also had a venous duplex study that revealed some deep and superficial reflux and she is sent for evaluation.  She has hx of skin grafting that did not take in the past.    She was seen by vascular surgery in the hospital in November 2022 for pulsatile bleeding a the left medial ankle.  This was controlled at bedside with pressure and prolene stitch.    She returns today for evaluation for venous insufficiency.  She is followed by Dr. Dellia Nims.  She does walk some and she does not get any claudication sx with walking.  She has been in compression from the wound care center trying to get her wounds to heal.  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  She was found to have reflux at the [facility] and GSV in the proximal thigh and proximal calf on the right.  She had deep reflux on the left and GSV in the proximal thigh.   She is not on blood thinners.  She does not take an aspirin.  She did take asa when she was pregnant but none since.  The pt is not on a statin for cholesterol management.  The pt is not on a daily aspirin.   Other AC:  none The pt is not on medication for hypertension.   The pt is not diabetic.   Tobacco hx:  never  No family hx of AAA.  Past Medical History:  Diagnosis Date   Anemia 03/30/2012   Hx of sickle cell disease   CAP (community acquired pneumonia)    2014   Cholelithiasis 03/30/2012   Chronic wound of extremity 04/01/2012   Elevated LFTs    Leg ulcer ([name]) 10/27/2012   Chronic under care of wound clinic   Multiple open wounds of lower extremity    chronic wounds B/LLE   Sickle cell  disease ([name])    Sinus bradycardia by electrocardiogram 04/03/2012    Past Surgical History:  Procedure Laterality Date   ALLOGRAFT APPLICATION Right 0/01/3817   Procedure: SURGICAL PREP FOR GRAFTING RIGHT LOWER EXTREMITY AND APPLICATION OF Jannifer Hick;  Surgeon: Irene Limbo, MD;  Location: [place];  Service: Plastics;  Laterality: Right;   APPLICATION OF A-CELL OF EXTREMITY Right 10/09/2015   Procedure: APPLICATION OF Jannifer Hick;  Surgeon: Irene Limbo, MD;  Location: [place];  Service: Plastics;  Laterality: Right;   BREAST SURGERY     left breast cyst aspiration   CESAREAN SECTION     CESAREAN SECTION N/A 01/06/2014   Procedure: CESAREAN SECTION;  Surgeon: Emily Filbert, MD;  Location: [facility];  Service: Obstetrics;  Laterality: N/A;   CESAREAN SECTION N/A 05/04/2017   Procedure: CESAREAN SECTION;  Surgeon: Woodroe Mode, MD;  Location: [place];  Service: Obstetrics;  Laterality: N/A;   I & D EXTREMITY Right 10/09/2015   Procedure: SURGICAL PREPARATION FOR GRAFTING RIGHT ANKLE AND APPLICATION THERASKIN;  Surgeon: Irene Limbo, MD;  Location: [place];  Service: Clinical cytogeneticist;  Laterality: Right;   SKIN FULL THICKNESS GRAFT Bilateral 06/17/2016   Procedure: SURGICAL PREP FOR GRAFTING, BILATERAL LOWER EXTREMITIES AND APPLICATION OF Jannifer Hick;  Surgeon: Irene Limbo, MD;  Location: [place];  Service: Plastics;  Laterality: Bilateral;   TONSILLECTOMY      Social History   Socioeconomic History   Marital status: Married    Spouse name: [name]   Number of children: 1   Years of education: Not on file   Highest education level: Not on file  Occupational History   Occupation: Employed in home care.  Tobacco Use   Smoking status: Never   Smokeless tobacco: Never  Vaping Use   Vaping Use: Never used  Substance and Sexual Activity   Alcohol use: No   Drug use: No   Sexual activity: Yes    Birth  control/protection: None  Other Topics Concern   Not on file  Social History Narrative   Lives with husband.   Social Determinants of Health   Financial Resource Strain: Not on file  Food Insecurity: Not on file  Transportation Needs: Not on file  Physical Activity: Not on file  Stress: Not on file  Social Connections: Not on file  Intimate Partner Violence: Not on file    Family History  Problem Relation Age of Onset   Sickle cell anemia Sister    Diabetes Mother    Asthma Mother    Sickle cell trait Mother    Sickle cell trait Father    Hypertension Father     Current Outpatient Medications  Medication Sig Dispense Refill   Deferiprone 500 MG TABS Take 500 mg by mouth daily at 12 noon. 90 tablet 2   feeding supplement (ENSURE ENLIVE / ENSURE PLUS) LIQD Take 237 mLs by mouth 2 (two) times daily between meals. (Patient not taking: Reported on 61/95/0932) 671 mL 12   folic acid (FOLVITE) 1 MG tablet Take 5 tablets (5 mg total) by mouth daily. (Patient taking differently: Take 1 mg by mouth daily.) 150 tablet 11   gabapentin (NEURONTIN) 300 MG capsule Take 1 capsule (300 mg total) by mouth 3 (three) times daily. 90 capsule 2   ibuprofen (ADVIL) 600 MG tablet TAKE 1 TABLET(600 MG) BY MOUTH EVERY 6 HOURS (Patient taking differently: Take 600 mg by mouth every 6 (six) hours as needed for mild pain.) 60 tablet 2   nutrition supplement, JUVEN, (JUVEN) PACK Take 1 packet by mouth 2 (two) times daily between meals. (Patient not taking: Reported on 04/17/2021) 30 each 5   Oxycodone HCl 10 MG TABS Take 1 tablet (10 mg total) by mouth every 4 (four) hours while awake. 90 tablet 0   No current facility-administered medications for this visit.    No Known Allergies  REVIEW OF SYSTEMS:   [X]  denotes positive finding, [ ]  denotes negative finding Cardiac  Comments:  Chest pain or chest pressure:    Shortness of breath upon exertion:    Short of breath when lying flat:    Irregular  heart rhythm:        Vascular    Pain in calf, thigh, or hip brought on by ambulation:    Pain in feet at night that wakes you up from your sleep:  x   Blood clot in your veins:    Leg swelling:  x       Pulmonary    Oxygen at home:    Productive cough:     Wheezing:  Neurologic    Sudden weakness in arms or legs:     Sudden numbness in arms or legs:     Sudden onset of difficulty speaking or slurred speech:    Temporary loss of vision in one eye:     Problems with dizziness:         Gastrointestinal    Blood in stool:     Vomited blood:         Genitourinary    Burning when urinating:     Blood in urine:        Psychiatric    Major depression:         Hematologic    Bleeding problems:    Problems with blood clotting too easily:        Skin    Rashes or ulcers:        Constitutional    Fever or chills:      PHYSICAL EXAMINATION:  Today's Vitals   06/29/21 0847  BP: 127/73  Pulse: 70  Resp: 20  Temp: 98.2 F (36.8 C)  TempSrc: Temporal  SpO2: 97%  Height: 5\' 7"  (1.702 m)  PainSc: 7   PainLoc: Leg   Body mass index is 18.61 kg/m.   General:  WDWN in NAD; vital signs documented above Gait: Not observed HENT: WNL, normocephalic Pulmonary: normal non-labored breathing without wheezing Cardiac: regular HR; without carotid bruits Abdomen: soft, NT, no masses; aortic pulse is not palpable Skin: without rashes Vascular Exam/Pulses: Bilateral radial pulses are palpable -she has excellent brisk biphasic doppler signals bilateral DP/PT/peroneal. Extremities:   Right medial leg   Right lateral leg   Left medial leg   Left lateral leg    Neurologic: A&O X 3;  moving all extremities equally Psychiatric:  The pt has Normal affect.   Non-Invasive Vascular Imaging:   Venous duplex on 06/01/2021: Venous Reflux Times  +--------------+---------+------+-----------+------------+----------------+    RIGHT          Reflux No Reflux Reflux  Time Diameter cms Comments                                     Yes                                              +--------------+---------+------+-----------+------------+----------------+  CFV            no                                                          +--------------+---------+------+-----------+------------+----------------+   FV prox        no                                                          +--------------+---------+------+-----------+------------+----------------+   FV mid         no                                                          +--------------+---------+------+-----------+------------+----------------+  FV dist        no                                                          +--------------+---------+------+-----------+------------+----------------+   Popliteal      no                                        chronic thrombus   +--------------+---------+------+-----------+------------+----------------+   GSV at SFJ                yes     >500 ms      0.493                       +--------------+---------+------+-----------+------------+----------------+   GSV prox thigh            yes     >500 ms      0.329                       +--------------+---------+------+-----------+------------+----------------+   GSV mid thigh  no                              0.301                       +--------------+---------+------+-----------+------------+----------------+   GSV dist thigh no                               0.33                       +--------------+---------+------+-----------+------------+----------------+   GSV at knee    no                              0.329                       +--------------+---------+------+-----------+------------+----------------+   GSV prox calf             yes     >500 ms      0.301                       +--------------+---------+------+-----------+------------+----------------+   SSV Pop Fossa                                   0.458                       +-------------+---------+------+-----------+------------+----------------+   SSV prox calf                                  0.425                       +--------------+---------+------+-----------+------------+----------------+   SSV mid calf  0.461                       +--------------+---------+------+-----------+------------+----------------+   +--------------+---------+------+-----------+------------+--------+   LEFT           Reflux No Reflux Reflux Time Diameter cms Comments                              Yes                                       +--------------+---------+------+-----------+------------+--------+   CFV                       yes    >1 second                          +--------------+---------+------+-----------+------------+--------+   FV prox        no                                                   +--------------+---------+------+-----------+------------+--------+   FV mid         no                                                   +--------------+---------+------+-----------+------------+--------+   FV dist        no                                                   +--------------+---------+------+-----------+------------+--------+   Popliteal      no                                                   +--------------+---------+------+-----------+------------+--------+   GSV at [facility]     no                               0.43                +--------------+---------+------+-----------+------------+--------+   GSV prox thigh            yes     >500 ms      0.228                +--------------+---------+------+-----------+------------+--------+   GSV mid thigh  no                              0.228                +--------------+---------+------+-----------+------------+--------+   GSV dist thigh no  0.256                 +--------------+---------+------+-----------+------------+--------+   GSV at knee    no                              0.179                +--------------+---------+------+-----------+------------+--------+   GSV prox calf                                  0.137                +--------------+---------+------+-----------+------------+--------+   SSV Pop Fossa  no                              0.224                +--------------+---------+------+-----------+------------+--------+   SSV prox calf  no                              0.251                +--------------+---------+------+-----------+------------+--------+   SSV mid calf   no                              0.237                +--------------+---------+------+-----------+------------+--------+  Summary:  Right:  - No evidence of superficial venous thrombosis in the right lower extremity.  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  - No evidence of deep vein reflux.  - Superficial vein reflux in the SFJ and GSV as above.  - Lymph nodes in the groin and popliteal fossa.     Left:  - No evidence of deep vein thrombosis seen in the left lower extremity,  from the common femoral through the popliteal veins.  - No evidence of superficial venous thrombosis in the left lower extremity.  - Deep vein reflux in the CFV.  - Superficial vein reflux in the GSV as above.  - Lymph node in the groin.  [name] 03/28/2021: [name] Findings:  +--------+------------------+-----+---------+--------+   Right    Rt Pressure (mmHg) Index Waveform  Comment    +--------+------------------+-----+---------+--------+   Brachial                          triphasic            +--------+------------------+-----+---------+--------+   PTA                               biphasic             +--------+------------------+-----+---------+--------+   DP                                biphasic             +--------+------------------+-----+---------+--------+    +--------+------------------+-----+---------+-------+   Left     Lt Pressure (mmHg) Index Waveform  Comment   +--------+------------------+-----+---------+-------+   Brachial  triphasic           +--------+------------------+-----+---------+-------+   PTA                               triphasic           +--------+------------------+-----+---------+-------+   DP                                biphasic            +--------+------------------+-----+---------+-------+   Summary:  Right:  Due to bilateral lower extremity ankle wounds and pain tolerance- abi was not obtained.  Left:  Due to bilateral lower extremity ankle wounds and pain tolerance- abi was not obtained.    [name] is a 51 y.o. female who presents with: non healing wounds BLE  -pt has palpable DP pedal pulses as well as excellent brisk biphasic DP/PT/peroneal doppler signals bilaterally.   -pt does have evidence of chronic DVT in the right popliteal vein.  Given this is chronic, would not start AC.  I discussed with Dr. Virl Cagey and pt may benefit from daily baby asa but will defer to PCP.   -Pt does have venous reflux on the right at the [facility] and GSV as well as the GSV proximal thigh & proximal calf and on the left in the deep system in the CFV and GSV in the proximal thigh.  The vein is not of adequate diameter for laser ablation and therefore not a candidate.   -she will continue with compression and wound care at the wound care center.  Do not feel that the wounds are due to arterial insuffiencey  -discussed the importance of leg elevation and how to elevate properly - pt is advised to elevate their legs and a diagram is given to them to demonstrate for pt to lay flat on their back with knees elevated and slightly bent with their feet higher than their knees, which puts their feet higher than their heart for 15 minutes per day.  If pt cannot lay flat, advised to lay as flat as possible.  -pt is  advised if she is sitting for an extended period of time to get up and move around -handout with recommendations given -pt will f/u as needed   Leontine Locket, [facility] 06/21/2021 11:12 AM  Clinic MD:  Virl Cagey

## 2021-06-21 NOTE — Progress Notes (Signed)
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 Type & Screen and H&H drawn pre transfusion. Patient's hemoglobin 7.2. Pre transfusion medications given (Tylenol and Benadryl) per order. Patient tolerated transfusion well with no adverse reaction. Vital signs remained stable. Discharge instructions given. Patient scheduled for follow-up labs next Tuesday. Patient alert, oriented and transfers in personal wheelchair at discharge.

## 2021-06-22 LAB — BPAM RBC
Blood Product Expiration Date: 202302222359
ISSUE DATE / TIME: 202302021025
Unit Type and Rh: 5100

## 2021-06-22 LAB — TYPE AND SCREEN
ABO/RH(D): B POS
Antibody Screen: NEGATIVE
Unit division: 0

## 2021-06-26 ENCOUNTER — Encounter (HOSPITAL_BASED_OUTPATIENT_CLINIC_OR_DEPARTMENT_OTHER): Payer: Medicaid Other | Attending: Internal Medicine | Admitting: Internal Medicine

## 2021-06-26 ENCOUNTER — Non-Acute Institutional Stay (HOSPITAL_COMMUNITY)
Admission: RE | Admit: 2021-06-26 | Discharge: 2021-06-26 | Disposition: A | Discharge status: Home / Self Care | Payer: Medicaid Other | Source: Ambulatory Visit | Attending: Internal Medicine | Admitting: Internal Medicine

## 2021-06-26 ENCOUNTER — Other Ambulatory Visit: Payer: Self-pay | Admitting: Family Medicine

## 2021-06-26 ENCOUNTER — Other Ambulatory Visit: Payer: Self-pay

## 2021-06-26 DIAGNOSIS — L97828 Non-pressure chronic ulcer of other part of left lower leg with other specified severity: Secondary | ICD-10-CM | POA: Diagnosis not present

## 2021-06-26 DIAGNOSIS — D571 Sickle-cell disease without crisis: Secondary | ICD-10-CM | POA: Diagnosis present

## 2021-06-26 DIAGNOSIS — L97818 Non-pressure chronic ulcer of other part of right lower leg with other specified severity: Secondary | ICD-10-CM | POA: Insufficient documentation

## 2021-06-26 LAB — CBC WITH DIFFERENTIAL/PLATELET
Abs Immature Granulocytes: 0.05 10*3/uL (ref 0.00–0.07)
Basophils Absolute: 0.1 10*3/uL (ref 0.0–0.1)
Basophils Relative: 1 %
Eosinophils Absolute: 1.1 10*3/uL — ABNORMAL HIGH (ref 0.0–0.5)
Eosinophils Relative: 9 %
HCT: 24.3 % — ABNORMAL LOW (ref 36.0–46.0)
Hemoglobin: 8.2 g/dL — ABNORMAL LOW (ref 12.0–15.0)
Immature Granulocytes: 0 %
Lymphocytes Relative: 22 %
Lymphs Abs: 2.5 10*3/uL (ref 0.7–4.0)
MCH: 29.6 pg (ref 26.0–34.0)
MCHC: 33.7 g/dL (ref 30.0–36.0)
MCV: 87.7 fL (ref 80.0–100.0)
Monocytes Absolute: 1.5 10*3/uL — ABNORMAL HIGH (ref 0.1–1.0)
Monocytes Relative: 13 %
Neutro Abs: 6.3 10*3/uL (ref 1.7–7.7)
Neutrophils Relative %: 55 %
Platelets: 340 10*3/uL (ref 150–400)
RBC: 2.77 MIL/uL — ABNORMAL LOW (ref 3.87–5.11)
RDW: 19.5 % — ABNORMAL HIGH (ref 11.5–15.5)
WBC: 11.5 10*3/uL — ABNORMAL HIGH (ref 4.0–10.5)
nRBC: 0.8 % — ABNORMAL HIGH (ref 0.0–0.2)

## 2021-06-26 NOTE — Progress Notes (Signed)
[name], [name] ([id number]) Visit Report for 06/26/2021 Arrival Information Details Patient Name: Date of Service: [name] [name] 06/26/2021 1:00 PM Medical Record Number: [id number] Patient Account Number: [id number] Date of Birth/Sex: Treating RN: [birth year] (51 y.o. [name] Primary Care [name]: [name] [name]: [name] [name]: [name]/Extender: [name] in Treatment: 10 Visit Information History Since Last Visit Added or deleted any medications: No Patient Arrived: Wheel Chair Any new allergies or adverse reactions: No Arrival Time: 12:59 Had a fall or experienced change in No Accompanied By: self activities of daily living that may affect Transfer Assistance: None risk of falls: Patient Identification Verified: Yes Signs or symptoms of abuse/neglect since last visito No Secondary Verification Process Completed: Yes Hospitalized since last visit: No Patient Requires Transmission-Based Precautions: No Implantable device outside of the clinic excluding No Patient Has Alerts: No cellular tissue based products placed in the center since last visit: Has Dressing in Place as Prescribed: Yes Pain Present Now: Yes Electronic Signature(s) Signed: 06/26/2021 6:07:00 PM By: [name] Catholic RN Entered By: [name] on 06/26/2021 13:02:26 -------------------------------------------------------------------------------- Clinic Level of Care Assessment Details Patient Name: Date of Service: [name] [name] 06/26/2021 1:00 PM Medical Record Number: [id number] Patient Account Number: [id number] Date of Birth/Sex: Treating RN: 23-Dec-1970 (51 y.o. [name] Primary Care [name]: [name] [name]: [name] [name]: [name] [name]/Extender: [name] in Treatment: 10 Clinic Level of Care Assessment Items TOOL 1 Quantity Score X- 1 0 Use when EandM and  Procedure is performed on INITIAL visit ASSESSMENTS - Nursing Assessment / Reassessment X- 1 20 General Physical Exam (combine w/ comprehensive assessment (listed just below) when performed on new pt. evals) X- 1 25 Comprehensive Assessment (HX, ROS, Risk Assessments, Wounds Hx, etc.) ASSESSMENTS - Wound and Skin Assessment / Reassessment _0  - 0 Dermatologic / Skin Assessment (not related to wound area) ASSESSMENTS - Ostomy and/or Continence Assessment and Care _1  - 0 Incontinence Assessment and Management _2  - 0 Ostomy Care Assessment and Management (repouching, etc.) PROCESS - Coordination of Care _3  - 0 Simple Patient / Family Education for ongoing care X- 1 20 Complex (extensive) Patient / Family Education for ongoing care X- 1 10 Staff obtains Programmer, systems, Records, T Results / Process Orders est X- 1 10 Staff telephones HHA, Nursing Homes / Clarify orders / etc _4  - 0 Routine Transfer to another Facility (non-emergent condition) _5  - 0 Routine Hospital Admission (non-emergent condition) _6  - 0 New Admissions / Biomedical engineer / Ordering NPWT Apligraf, etc. , _7  - 0 Emergency Hospital Admission (emergent condition) PROCESS - Special Needs _8  - 0 Pediatric / Minor Patient Management _9  - 0 Isolation Patient Management _10  - 0 Hearing / Language / Visual special needs _11  - 0 Assessment of Community assistance (transportation, D/C planning, etc.) _12  - 0 Additional assistance / Altered mentation _13  - 0 Support Surface(s) Assessment (bed, cushion, seat, etc.) INTERVENTIONS - Miscellaneous _14  - 0 External ear exam _15  - 0 Patient Transfer (multiple staff / Civil Service fast streamer / Similar devices) _16  - 0 Simple Staple / Suture removal (25 or less) X- 1 10 Complex Staple / Suture removal (26 or more) _17  - 0 Hypo/Hyperglycemic Management (do not check if billed separately) _18  - 0 Ankle / Brachial Index ([name]) - do not check if billed separately Has the patient been seen  at the hospital within the last three years: Yes Total Score: 95 Level Of Care: New/Established - Level 3  Electronic Signature(s) Signed: 06/26/2021 6:07:00 PM By: [name] Catholic RN Entered By: [name] on 06/26/2021 18:05:04 -------------------------------------------------------------------------------- Encounter Discharge Information Details Patient Name: Date of Service: [name], [name] 06/26/2021 1:00 PM Medical Record Number: [id number] Patient Account Number: [id number] Date of Birth/Sex: Treating RN: 01-13-1971 (51 y.o. [name] Primary Care Dallan Schonberg: [name] [name]: [name] Kiana Hollar: [name] Ambermarie Honeyman/Extender: [name] in Treatment: 10 Encounter Discharge Information Items Discharge Condition: Stable Ambulatory Status: Ambulatory Discharge Destination: Home Transportation: [name] Accompanied By: self Schedule Follow-up Appointment: Yes Clinical Summary of Care: Patient Declined Electronic Signature(s) Signed: 06/26/2021 6:07:00 PM By: [name] Catholic RN Entered By: [name] on 06/26/2021 18:06:19 -------------------------------------------------------------------------------- Lower Extremity Assessment Details Patient Name: Date of Service: [name], [name] 06/26/2021 1:00 PM Medical Record Number: [id number] Patient Account Number: [id number] Date of Birth/Sex: Treating RN: [birth year] (51 y.o. [name] Primary Care Zamiyah Resendes: [name] [name]: [name] Kerstyn Coryell: [name] Dezarai Prew/Extender: [name] in Treatment: 10 Edema Assessment Assessed: Shirlyn Goltz: No] [Right: No] Edema: [Left: No] [Right: No] Calf Left: Right: Point of Measurement: 33 cm From Medial Instep 25 cm 27.5 cm Ankle Left: Right: Point of Measurement: 10 cm From Medial Instep 17 cm 19.5 cm Vascular Assessment Pulses: Dorsalis Pedis Palpable: [Left:Yes]  [Right:Yes] Electronic Signature(s) Signed: 06/26/2021 6:07:00 PM By: [name] Catholic RN Entered By: [name] on 06/26/2021 13:12:00 -------------------------------------------------------------------------------- Multi Wound Chart Details Patient Name: Date of Service: [name], [name] 06/26/2021 1:00 PM Medical Record Number: [id number] Patient Account Number: [id number] Date of Birth/Sex: Treating RN: March 03, 1971 (51 y.o. [name] Primary Care Arkel Cartwright: [name] [name]: [name] Suhaib Guzzo: [name] Avik Leoni/Extender: [name] in Treatment: 10 Vital Signs Height(in): 67 Pulse(bpm): 84 Weight(lbs): Blood Pressure(mmHg): 150/73 Body Mass Index(BMI): Temperature(F): 98.5 Respiratory Rate(breaths/min): 18 Photos: [17:Right, Lateral Lower Leg] [18:Left, Posterior Ankle] [19:Left, Medial Ankle] Wound Location: [17:Gradually Appeared] [18:Gradually Appeared] [19:Gradually Appeared] Wounding Event: [17:[name] Cell Lesion] [18:[name] Cell Lesion] [19:[name] Cell Lesion] Primary Etiology: [17:N/A] [18:N/A] [19:N/A] Secondary Etiology: [17:Anemia, [name] Cell Disease,] [18:Anemia, [name] Cell Disease,] [19:Anemia, [name] Cell Disease,] Comorbid History: [17:Neuropathy 10/05/2012] [18:Neuropathy 12/25/2020] [19:Neuropathy 12/25/2020] Date Acquired: [17:10] [18:10] [19:10] [name] of Treatment: [17:Open] [18:Open] [19:Open] Wound Status: [17:No] [18:No] [19:No] Wound Recurrence: [17:Yes] [18:No] [19:No] Clustered Wound: [17:11x6.8x0.1] [18:2.5x1x0.1] [19:3x2x0.1] Measurements L x W x D (cm) [17:58.748] [18:1.963] [19:4.712] A (cm) : rea [17:5.875] [18:0.196] [19:0.471] Volume (cm) : [17:68.70%] [18:86.10%] [19:77.80%] % Reduction in Area: [17:68.70%] [18:95.40%] [19:96.80%] % Reduction in Volume: [17:Full Thickness Without Exposed] [18:Full Thickness Without Exposed] [19:Full Thickness Without Exposed] Classification: [17:Support  Structures Large] [18:Support Structures Medium] [19:Support Structures Medium] Exudate Amount: [17:Purulent] [18:Serosanguineous] [19:Serosanguineous] Exudate Type: [17:yellow, [name], green] [18:red, [name]] [19:red, [name]] Exudate Color: [17:Distinct, outline attached] [18:Distinct, outline attached] [19:Distinct, outline attached] Wound Margin: [17:Medium (34-66%)] [18:Large (67-100%)] [19:Medium (34-66%)] Granulation Amount: [17:Pink] [18:Pink] [19:Pink, Hyper-granulation] Granulation Quality: [17:Medium (34-66%)] [18:Small (1-33%)] [19:Medium (34-66%)] Necrotic Amount: [17:Fat Layer (Subcutaneous Tissue): Yes Fat Layer (Subcutaneous Tissue): Yes Fat Layer (Subcutaneous Tissue): Yes] Exposed Structures: [17:Fascia: No Tendon: No Muscle: No Joint: No Bone: No Small (1-33%)] [18:Fascia: No Tendon: No Muscle: No Joint: No Bone: No Small (1-33%)] [19:Fascia: No Tendon: No Muscle: No Joint: No Bone: No Small (1-33%)] Wound Number: 21 N/A N/A Photos: N/A N/A Right, Medial Ankle N/A N/A Wound Location: Gradually Appeared N/A N/A Wounding Event: [name] Cell Lesion N/A N/A Primary Etiology: Venous Leg Ulcer N/A N/A Secondary Etiology: Anemia, [name] Cell Disease, N/A N/A Comorbid  History: Neuropathy 06/26/2021 N/A N/A Date Acquired: 0 N/A N/A [name] of Treatment: Open N/A N/A Wound Status: No N/A N/A Wound Recurrence: No N/A N/A Clustered Wound: 8.2x4.5x0.1 N/A N/A Measurements L x W x D (cm) 28.981 N/A N/A A (cm) : rea 2.898 N/A N/A Volume (cm) : 0.00% N/A N/A % Reduction in Area: 0.00% N/A N/A % Reduction in Volume: Full Thickness Without Exposed N/A N/A Classification: Support Structures Large N/A N/A Exudate Amount: Purulent N/A N/A Exudate Type: yellow, [name], green N/A N/A Exudate Color: Flat and Intact N/A N/A Wound Margin: Small (1-33%) N/A N/A Granulation Amount: Pink N/A N/A Granulation Quality: Large (67-100%) N/A N/A Necrotic Amount: Fat Layer  (Subcutaneous Tissue): Yes N/A N/A Exposed Structures: Fascia: No Tendon: No Muscle: No Joint: No Bone: No Small (1-33%) N/A N/A Epithelialization: Treatment Notes Electronic Signature(s) Signed: 06/26/2021 5:02:53 PM By: Linton Ham MD Signed: 06/26/2021 6:14:04 PM By: Levan Hurst RN, BSN Entered By: Linton Ham on 06/26/2021 13:40:19 -------------------------------------------------------------------------------- Multi-Disciplinary Care Plan Details Patient Name: Date of Service: [name], [name] 06/26/2021 1:00 PM Medical Record Number: [id number] Patient Account Number: [id number] Date of Birth/Sex: Treating RN: April 02, 1971 (51 y.o. [name] Primary Care Zoeann Mol: [name] [name]: [name] Jessi Pitstick: [name] Amery Vandenbos/Extender: [name] in Treatment: 10 Multidisciplinary Care Plan reviewed with physician Active Inactive Venous Leg Ulcer Nursing Diagnoses: Actual venous Insuffiency (use after diagnosis is confirmed) Knowledge deficit related to disease process and management Goals: Patient will maintain optimal edema control Date Initiated: 06/26/2021 Target Resolution Date: 07/24/2021 Goal Status: Active Interventions: Assess peripheral edema status every visit. Compression as ordered Treatment Activities: Therapeutic compression applied : 06/26/2021 Notes: Wound/Skin Impairment Nursing Diagnoses: Impaired tissue integrity Goals: Patient/caregiver will verbalize understanding of skin care regimen Date Initiated: 04/17/2021 Target Resolution Date: 07/24/2021 Goal Status: Active Ulcer/skin breakdown will have a volume reduction of 30% by week 4 Date Initiated: 04/17/2021 Date Inactivated: 05/29/2021 Target Resolution Date: 05/15/2021 Goal Status: Met Ulcer/skin breakdown will have a volume reduction of 50% by week 8 Date Initiated: 05/29/2021 Date Inactivated: 06/26/2021 Target Resolution Date:  06/26/2021 Goal Status: Unmet Unmet Reason: venous reflux Ulcer/skin breakdown will have a volume reduction of 80% by week 12 Date Initiated: 06/26/2021 Target Resolution Date: 07/24/2021 Goal Status: Active Interventions: Assess patient/caregiver ability to obtain necessary supplies Assess patient/caregiver ability to perform ulcer/skin care regimen upon admission and as needed Assess ulceration(s) every visit Provide education on ulcer and skin care Treatment Activities: Topical wound management initiated : 04/17/2021 Notes: 06/08/21: Left leg wounds greater than 30% volume reduction, right leg acute infection. Electronic Signature(s) Signed: 06/26/2021 6:07:00 PM By: [name] Catholic RN Entered By: [name] on 06/26/2021 13:06:17 -------------------------------------------------------------------------------- Pain Assessment Details Patient Name: Date of Service: [name], Planada [name] 06/26/2021 1:00 PM Medical Record Number: [id number] Patient Account Number: [id number] Date of Birth/Sex: Treating RN: 1970/08/05 (51 y.o. [name] Primary Care Lisbeth Puller: [name] [name]: [name] Rishit Burkhalter: [name] Blayden Conwell/Extender: [name] in Treatment: 10 Active Problems Location of Pain Severity and Description of Pain Patient Has Paino Yes Site Locations Pain Location: Pain in Ulcers With Dressing Change: Yes Duration of the Pain. Constant / Intermittento Intermittent Rate the pain. Current Pain Level: 2 Worst Pain Level: 3 Least Pain Level: 0 Character of Pain Describe the Pain: Aching Pain Management and Medication Current Pain Management: Medication: Yes Is the Current Pain Management Adequate: Adequate How does your wound impact your activities of daily livingo Sleep: No  Bathing: No Appetite: No Relationship With Others: No Bladder Continence: No Emotions: No Bowel Continence: No Work: No Toileting: No Drive:  No Dressing: No Hobbies: No Electronic Signature(s) Signed: 06/26/2021 6:07:00 PM By: [name] Catholic RN Entered By: [name] on 06/26/2021 13:03:11 -------------------------------------------------------------------------------- Patient/Caregiver Education Details Patient Name: Date of Service: [name], [name] 2/7/2023andnbsp1:00 PM Medical Record Number: [id number] Patient Account Number: [id number] Date of Birth/Gender: Treating RN: Oct 31, 1970 (51 y.o. [name] Primary Care Physician: [name] [name]: [name] Physician: [name] Physician/Extender: [name] in Treatment: 10 Education Assessment Education Provided To: Patient Education Topics Provided Venous: Methods: Explain/Verbal Responses: Reinforcements needed, State content correctly Wound/Skin Impairment: Methods: Explain/Verbal Responses: Reinforcements needed, State content correctly Electronic Signature(s) Signed: 06/26/2021 6:07:00 PM By: [name] Catholic RN Entered By: [name] on 06/26/2021 13:06:43 -------------------------------------------------------------------------------- Wound Assessment Details Patient Name: Date of Service: [name], [name] 06/26/2021 1:00 PM Medical Record Number: [id number] Patient Account Number: [id number] Date of Birth/Sex: Treating RN: 02-01-1971 (51 y.o. [name] Primary Care Tiwanda Threats: [name] [name]: [name] Levell Tavano: [name] Taliesin Hartlage/Extender: [name] in Treatment: 10 Wound Status Wound Number: 17 Primary Etiology: [name] Cell Lesion Wound Location: Right, Lateral Lower Leg Wound Status: Open Wounding Event: Gradually Appeared Comorbid History: Anemia, [name] Cell Disease, Neuropathy Date Acquired: 10/05/2012 [name] Of Treatment: 10 Clustered Wound: Yes Photos Wound Measurements Length: (cm) 11 Width: (cm) 6.8 Depth: (cm) 0.1 Area:  (cm) 58.748 Volume: (cm) 5.875 % Reduction in Area: 68.7% % Reduction in Volume: 68.7% Epithelialization: Small (1-33%) Tunneling: No Undermining: No Wound Description Classification: Full Thickness Without Exposed Support Structures Wound Margin: Distinct, outline attached Exudate Amount: Large Exudate Type: Purulent Exudate Color: yellow, [name], green Foul Odor After Cleansing: No Slough/Fibrino Yes Wound Bed Granulation Amount: Medium (34-66%) Exposed Structure Granulation Quality: Pink Fascia Exposed: No Necrotic Amount: Medium (34-66%) Fat Layer (Subcutaneous Tissue) Exposed: Yes Necrotic Quality: Adherent Slough Tendon Exposed: No Muscle Exposed: No Joint Exposed: No Bone Exposed: No Treatment Notes Wound #17 (Lower Leg) Wound Laterality: Right, Lateral Cleanser Soap and Water Discharge Instruction: May shower and wash wound with dial antibacterial soap and water prior to dressing change. Wound Cleanser Discharge Instruction: Cleanse the wound with wound cleanser prior to applying a clean dressing using gauze sponges, not tissue or cotton balls. Peri-Wound Care Triamcinolone 15 (g) Discharge Instruction: Use triamcinolone 15 (g) as directed Sween Lotion (Moisturizing lotion) Discharge Instruction: Apply moisturizing lotion as directed Topical Primary Dressing KerraCel Ag Gelling Fiber Dressing, 4x5 in (silver alginate) Discharge Instruction: Apply silver alginate to wound bed as instructed Secondary Dressing ABD Pad, 5x9 Discharge Instruction: Apply over primary dressing as directed. Zetuvit Plus 4x8 in Discharge Instruction: Apply over primary dressing as directed. Secured With Compression Wrap Kerlix Roll 4.5x3.1 (in/yd) Discharge Instruction: Apply Kerlix and Coban compression as directed. Coban Self-Adherent Wrap 4x5 (in/yd) Discharge Instruction: Apply over Kerlix as directed. Compression Stockings Add-Ons Electronic Signature(s) Signed: 06/26/2021  6:03:00 PM By: Baruch Gouty RN, BSN Signed: 06/26/2021 6:07:00 PM By: [name] Catholic RN Entered By: Baruch Gouty on 06/26/2021 13:19:32 -------------------------------------------------------------------------------- Wound Assessment Details Patient Name: Date of Service: [name], [name] 06/26/2021 1:00 PM Medical Record Number: [id number] Patient Account Number: [id number] Date of Birth/Sex: Treating RN: 11/22/1970 (51 y.o. [name] Primary Care Dayvon Dax: [name] [name]: [name] Dhanvin Szeto: [name] Yvonda Fouty/Extender: [name] in Treatment: 10 Wound Status Wound Number: 18 Primary Etiology: [name] Cell Lesion Wound Location: Left, Posterior  Ankle Wound Status: Open Wounding Event: Gradually Appeared Comorbid History: Anemia, [name] Cell Disease, Neuropathy Date Acquired: 12/25/2020 [name] Of Treatment: 10 Clustered Wound: No Photos Wound Measurements Length: (cm) 2.5 Width: (cm) 1 Depth: (cm) 0.1 Area: (cm) 1.963 Volume: (cm) 0.196 % Reduction in Area: 86.1% % Reduction in Volume: 95.4% Epithelialization: Small (1-33%) Tunneling: No Undermining: No Wound Description Classification: Full Thickness Without Exposed Support Structures Wound Margin: Distinct, outline attached Exudate Amount: Medium Exudate Type: Serosanguineous Exudate Color: red, [name] Foul Odor After Cleansing: No Slough/Fibrino Yes Wound Bed Granulation Amount: Large (67-100%) Exposed Structure Granulation Quality: Pink Fascia Exposed: No Necrotic Amount: Small (1-33%) Fat Layer (Subcutaneous Tissue) Exposed: Yes Necrotic Quality: Adherent Slough Tendon Exposed: No Muscle Exposed: No Joint Exposed: No Bone Exposed: No Treatment Notes Wound #18 (Ankle) Wound Laterality: Left, Posterior Cleanser Soap and Water Discharge Instruction: May shower and wash wound with dial antibacterial soap and water prior to dressing change. Wound  Cleanser Discharge Instruction: Cleanse the wound with wound cleanser prior to applying a clean dressing using gauze sponges, not tissue or cotton balls. Peri-Wound Care Triamcinolone 15 (g) Discharge Instruction: Use triamcinolone 15 (g) as directed Sween Lotion (Moisturizing lotion) Discharge Instruction: Apply moisturizing lotion as directed Topical Primary Dressing KerraCel Ag Gelling Fiber Dressing, 4x5 in (silver alginate) Discharge Instruction: Apply silver alginate to wound bed as instructed Secondary Dressing ABD Pad, 5x9 Discharge Instruction: Apply over primary dressing as directed. Zetuvit Plus 4x8 in Discharge Instruction: Apply over primary dressing as directed. Secured With Compression Wrap Kerlix Roll 4.5x3.1 (in/yd) Discharge Instruction: Apply Kerlix and Coban compression as directed. Coban Self-Adherent Wrap 4x5 (in/yd) Discharge Instruction: Apply over Kerlix as directed. Compression Stockings Add-Ons Electronic Signature(s) Signed: 06/26/2021 6:03:00 PM By: Baruch Gouty RN, BSN Signed: 06/26/2021 6:07:00 PM By: [name] Catholic RN Entered By: Baruch Gouty on 06/26/2021 13:22:22 -------------------------------------------------------------------------------- Wound Assessment Details Patient Name: Date of Service: [name], [name] 06/26/2021 1:00 PM Medical Record Number: [id number] Patient Account Number: [id number] Date of Birth/Sex: Treating RN: 12-16-1970 (51 y.o. [name] Primary Care Hokulani Rogel: [name] [name]: [name] Cambria Osten: [name] Erwin Nishiyama/Extender: [name] in Treatment: 10 Wound Status Wound Number: 19 Primary Etiology: [name] Cell Lesion Wound Location: Left, Medial Ankle Wound Status: Open Wounding Event: Gradually Appeared Comorbid History: Anemia, [name] Cell Disease, Neuropathy Date Acquired: 12/25/2020 [name] Of Treatment: 10 Clustered Wound: No Photos Wound  Measurements Length: (cm) 3 Width: (cm) 2 Depth: (cm) 0.1 Area: (cm) 4.712 Volume: (cm) 0.471 % Reduction in Area: 77.8% % Reduction in Volume: 96.8% Epithelialization: Small (1-33%) Tunneling: No Undermining: No Wound Description Classification: Full Thickness Without Exposed Support Structures Wound Margin: Distinct, outline attached Exudate Amount: Medium Exudate Type: Serosanguineous Exudate Color: red, [name] Foul Odor After Cleansing: No Slough/Fibrino Yes Wound Bed Granulation Amount: Medium (34-66%) Exposed Structure Granulation Quality: Pink, Hyper-granulation Fascia Exposed: No Necrotic Amount: Medium (34-66%) Fat Layer (Subcutaneous Tissue) Exposed: Yes Necrotic Quality: Adherent Slough Tendon Exposed: No Muscle Exposed: No Joint Exposed: No Bone Exposed: No Treatment Notes Wound #19 (Ankle) Wound Laterality: Left, Medial Cleanser Soap and Water Discharge Instruction: May shower and wash wound with dial antibacterial soap and water prior to dressing change. Wound Cleanser Discharge Instruction: Cleanse the wound with wound cleanser prior to applying a clean dressing using gauze sponges, not tissue or cotton balls. Peri-Wound Care Triamcinolone 15 (g) Discharge Instruction: Use triamcinolone 15 (g) as directed Sween Lotion (Moisturizing lotion) Discharge Instruction: Apply moisturizing lotion as directed Topical Primary Dressing KerraCel Ag  Gelling Fiber Dressing, 4x5 in (silver alginate) Discharge Instruction: Apply silver alginate to wound bed as instructed Secondary Dressing ABD Pad, 5x9 Discharge Instruction: Apply over primary dressing as directed. Zetuvit Plus 4x8 in Discharge Instruction: Apply over primary dressing as directed. Secured With Compression Wrap Kerlix Roll 4.5x3.1 (in/yd) Discharge Instruction: Apply Kerlix and Coban compression as directed. Coban Self-Adherent Wrap 4x5 (in/yd) Discharge Instruction: Apply over Kerlix as  directed. Compression Stockings Add-Ons Electronic Signature(s) Signed: 06/26/2021 6:03:00 PM By: Baruch Gouty RN, BSN Signed: 06/26/2021 6:07:00 PM By: [name] Catholic RN Entered By: Baruch Gouty on 06/26/2021 13:21:39 -------------------------------------------------------------------------------- Wound Assessment Details Patient Name: Date of Service: [name], [name] 06/26/2021 1:00 PM Medical Record Number: [id number] Patient Account Number: [id number] Date of Birth/Sex: Treating RN: 12-01-1970 (51 y.o. [name] Primary Care Geovanna Simko: [name] [name]: [name] Anmol Fleck: [name] Declan Mier/Extender: [name] in Treatment: 10 Wound Status Wound Number: 21 Primary Etiology: [name] Cell Lesion Wound Location: Right, Medial Ankle Secondary Etiology: Venous Leg Ulcer Wounding Event: Gradually Appeared Wound Status: Open Date Acquired: 06/26/2021 Comorbid History: Anemia, [name] Cell Disease, Neuropathy [name] Of Treatment: 0 Clustered Wound: No Photos Wound Measurements Length: (cm) 8.2 Width: (cm) 4.5 Depth: (cm) 0.1 Area: (cm) 28.981 Volume: (cm) 2.898 % Reduction in Area: 0% % Reduction in Volume: 0% Epithelialization: Small (1-33%) Tunneling: No Undermining: No Wound Description Classification: Full Thickness Without Exposed Support Structures Wound Margin: Flat and Intact Exudate Amount: Large Exudate Type: Purulent Exudate Color: yellow, [name], green Foul Odor After Cleansing: No Slough/Fibrino Yes Wound Bed Granulation Amount: Small (1-33%) Exposed Structure Granulation Quality: Pink Fascia Exposed: No Necrotic Amount: Large (67-100%) Fat Layer (Subcutaneous Tissue) Exposed: Yes Necrotic Quality: Adherent Slough Tendon Exposed: No Muscle Exposed: No Joint Exposed: No Bone Exposed: No Treatment Notes Wound #21 (Ankle) Wound Laterality: Right, Medial Cleanser Soap and Water Discharge  Instruction: May shower and wash wound with dial antibacterial soap and water prior to dressing change. Wound Cleanser Discharge Instruction: Cleanse the wound with wound cleanser prior to applying a clean dressing using gauze sponges, not tissue or cotton balls. Peri-Wound Care Triamcinolone 15 (g) Discharge Instruction: Use triamcinolone 15 (g) as directed Sween Lotion (Moisturizing lotion) Discharge Instruction: Apply moisturizing lotion as directed Topical Primary Dressing KerraCel Ag Gelling Fiber Dressing, 4x5 in (silver alginate) Discharge Instruction: Apply silver alginate to wound bed as instructed Secondary Dressing ABD Pad, 5x9 Discharge Instruction: Apply over primary dressing as directed. Zetuvit Plus 4x8 in Discharge Instruction: Apply over primary dressing as directed. Secured With Compression Wrap Kerlix Roll 4.5x3.1 (in/yd) Discharge Instruction: Apply Kerlix and Coban compression as directed. Coban Self-Adherent Wrap 4x5 (in/yd) Discharge Instruction: Apply over Kerlix as directed. Compression Stockings Add-Ons Electronic Signature(s) Signed: 06/26/2021 6:03:00 PM By: Baruch Gouty RN, BSN Signed: 06/26/2021 6:07:00 PM By: [name] Catholic RN Entered By: Baruch Gouty on 06/26/2021 13:23:01 -------------------------------------------------------------------------------- Vitals Details Patient Name: Date of Service: [name], [name] 06/26/2021 1:00 PM Medical Record Number: [id number] Patient Account Number: [id number] Date of Birth/Sex: Treating RN: [birth year] (51 y.o. [name] Primary Care Terilynn Buresh: [name] [name]: [name] Julyssa Kyer: [name] [name] Klingberg/Extender: [name] in Treatment: 10 Vital Signs Time Taken: 13:03 Temperature (F): 98.5 Height (in): 67 Pulse (bpm): 84 Source: Stated Respiratory Rate (breaths/min): 18 Blood Pressure (mmHg): 150/73 Reference Range: 80 - 120 mg /  dl Electronic Signature(s) Signed: 06/26/2021 6:07:00 PM By: [name] Catholic RN Entered By: [name] on 06/26/2021 13:03:59

## 2021-06-26 NOTE — Progress Notes (Signed)
°  Provider: [place]  Hollis FNP     Procedure:  lab draw after transfusion     Note: Collected labs ( CBC, CMP ) from patient via peripheral lab draw as ordered. Pt received 1 unit PRBC on 06/21/21.  Patient tolerated well. Pt declined AVS. Alert and oriented, discharged in wheelchair, uses without difficulty.

## 2021-06-27 NOTE — Progress Notes (Signed)
[name], [name] ([id number]) Visit Report for 06/26/2021 HPI Details Patient Name: Date of Service: [name] [name] 06/26/2021 1:00 PM Medical Record Number: [id number] Patient Account Number: [id number] Date of Birth/Sex: Treating RN: 12-Feb-1971 (51 y.o. [name] Primary Care Provider: [name] Other Clinician: Referring Provider: Treating Provider/Extender: Farrel Demark Weeks in Treatment: 10 History of Present Illness Location: medial and lateral ankle region on the right and left medial malleolus Quality: Patient reports experiencing a shooting pain to affected area(s). Severity: Patient states wound(s) are getting worse. Duration: right lower extremity bimalleolar ulcers have been present for approximately 2 years; the rright meedial malleolus ulcer has been there proximally 6 months Timing: Pain in wound is constant (hurts all the time) Context: The wound would happen gradually ssociated Signs and Symptoms: Patient reports having increase discharge. A HPI Description: 51 year old patient with a history of sickle cell anemia who was last seen by me with ulceration of the right lower extremity above the ankle and was referred to Dr. Leland Johns for a surgical debridement as I was unable to do anything in the office due to excruciating pain. At that stage she was referred from the plastic surgery service to dermatology who treated her for a skin infection with doxycycline and then Levaquin and a local antibiotic ointment. I understand the patient has since developed ulceration on the left ankle both medial and lateral and was now referred back to the wound center as dermatology has finished the management. I do not have any notes from the dermatology department Old notes: 51 year old patient with a history of sickle cell anemia, pain bilateral lower extremities, right lower extremity ulcer and has a history of receiving a skin graft( Theraskin) several  months ago. She has been visiting the wound center [facility] and was seen by Dr. Dellia Nims and Dr. Leland Johns. after prolonged conservator therapy between July 2016 and January 2017. She had been seen by the plastic surgeon and taken to the OR for debridement and application of Theraskin. She had 3 applications of Theraskin and was then treated with collagen. Prior to that she had a history of similar problems in 2014 and was treated conservatively. Had a reflux study done for the right lower extremity in August 2016 without reflux or DVT . Past medical history significant for sickle cell disease, anemia, leg ulcers, cholelithiasis,and has never been a smoker. Once the patient was discharged on the wound center she says within 2 or 3 weeks the problems recurred and she has been treating it conservatively. since I saw her 3 weeks ago at [facility] she has been unable to get her dressing material but has completed a course of doxycycline. 6/7/ 2017 -- lower extremity venous duplex reflux evaluation was done No evidence of SVT or DVT in the RLL. No venous incompetence in the RLL. No further vascular workup is indicated at this time. She was seen by Dr. [name], on 10/04/2015. She agreed with the plan of taking her to the OR for debridement and application of theraskin and would also take biopsies to rule out pyoderma gangrenosum. Follow-up note dated May 31 received and she was status post application of Theraskin to multiple ulcers around the right ankle. Pathology did not show evidence of malignancy or pyoderma gangrenosum. She would continue to see as in the wound clinic for further care and see Dr. Leland Johns as needed. The patient brought the biopsy report and it was consistent with stasis ulcer no evidence of malignancy and the  comment was that there was some adjacent neovascularization, fibrosis and patchy perivascular chronic inflammation. 11/15/2015 -- today we applied her first application  of Theraskin 11/30/15; TheraSkin #2 12/13/2015 -- she is having a lot of pain locally and is here for possible application of a theraskin today. 01/16/2016 -- the patient has significant pain and has noticed despite in spite of all local care and oral pain medication. It is impossible to debride her in the office. 02/06/2016 -- I do not see any notes from Dr. Iran Planas( the patient has not made a call to the office know as she heard from them) and the only visit to recently was with her PCP Dr. Danella Penton -- I saw her on 01/16/2016 and prescribed 90 tablets of oxycodone 10 mg and did lab work and screening for HIV. the HIV was negative and hemoglobin was 6.3 with a WBC count of 14.9 and hematocrit of 17.8 with platelets of 561. 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 The bimalleolar ulcers to the right lower extremity have been present for approximately 2 years, the left medial malleolus ulcer has been present approximately 6 months. She has followed with Dr.Thimmappa in the past and has had a total of 3 applications of Theraskin (01/2015, 09/2015, 06/17/16). She has also followed with Dr. Con Memos here in the clinic and has received 2 applications of TheraSkin (11/10/15, 11/30/15). The patient does experience chronic, and is not amenable to debridement. 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 She is not currently on any antibiotic therapy and has not been treated with any recently. 07/17/2016 -- was seen by Dr. Iran Planas of plastic surgery who saw her 2 weeks postop application of Theraskin #3. She had removed her dressing and asked her to apply silver alginate on alternate days and follow-up back with the wound center. Future debridements and application of skin substitute would have to be done in the hospital due to her high risk for  anesthesia. READMISSION 04/17/2021 Patient is now a 51 year old woman that we have had in this clinic for a prolonged period of time and 2016-2017 and then again for 2 visits in February 2018. At that point she had wounds on the right lower leg predominantly medial. She had also been seen by plastic surgery Dr. Leland Johns who I believe took her to the OR for operative debridement and application of TheraSkin in 2017. After she left our clinic she was followed for a very prolonged period of time in the wound care center in [facility] who then referred her ultimately to [facility] where she was seen by Dr. Vernona Rieger. Again taken her to the OR for skin grafting which apparently did not take. She had multiple other attempts at dressings although I have not really looked over all of these notes in great detail. She has not been seen in a wound care center in about a year. She states over the last year in addition to her right lower leg she has developed wounds on the left lower leg quite extensive. She is using Xeroform to all of these wounds without really any improvement. She also has Medicaid which does not cover wound products. The patient has had vascular work-ups in the past including most recently on 03/28/2021 showing biphasic waveforms on the right triphasic at the PTA and biphasic at the dorsalis pedis on the left. She was  unable to tolerate any degree of compression to do ABIs. Unfortunately TBI's were also not done. She had venous reflux studies done in 2017. This did not show any evidence of a DVT or SVT and no venous incompetence was noted in the right leg at the time this was the only side with the wound As noted I did not look all over her old records. She apparently had a course of HBO and [facility] although I am not sure what the indication would have been. In any case she developed seizures and terminated treatment earlier. She is generally much more disabled than when we last saw her in clinic.  She can no longer walk pretty much wheelchair-bound because predominantly of pain in the left hip. 04/24/2021; the patient tolerated the wraps we put on. We used Santyl and Hydrofera Blue under compression. I brought her back for a nurse visit for a change in dressing. With Medicaid we will have a hard time getting anything paid for and hence the need for compression. She arrives in clinic with all the wounds looking somewhat better in terms of surface 12/20; circumferential wound on the right from the lateral to the medial. She has open areas on the left medial and left lateral x2 on all of this with the same surface. This does not look completely healthy although she does have some epithelialization. She is not complaining of a lot of pain which is unusual for her sickle ulcers. I have not looked over her extensive records from [facility]. She had recent arterial studies and has a history of venous reflux studies I will need to look these over although I do not believe she has significant arterial disease 2023 05/22/2021; patient's wound areas measure slightly smaller. Still a lot of drainage coming from the right we have been using Hydrofera Blue and Santyl with some improvement in the wound surfaces. She tells me she will be getting transfused later in the week for her underlying sickle cell anemia I have looked over her recent arterial studies which were done in the fall. This was in November and showed biphasic and triphasic waveforms but she could not tolerate ABIs because of pressure and unfortunately TBI's were not done. She has not had recent venous reflux studies that I can see 1/10; not much change about the same surface area. This has a yellowish surface to it very gritty. We have been using Santyl and Hydrofera Blue for a prolonged period. Culture I did last week showed methicillin sensitive staph aureus "rare". Our intake nurse reports greenish drainage which may be the Hydrofera Blue  itself 1/17; wounds are continue to measure smaller although I am not sure about the accuracy here. Especially the areas on the right are covered in what looks to be a nonviable surface although she does have some epithelialization. Similarly she has areas on the left medial and left lateral ankle area which appear to have a better surface and perhaps are slightly smaller. We have been using Santyl and Hydrofera Blue. She cannot tolerate mechanical debridements She went for her reflux studies which showed significant reflux at the greater saphenous vein at the saphenofemoral junction as well as the greater saphenous vein in the proximal calf on the left she had reflux in the thigh and the common femoral vein and supra vein Fishel vein reflux in the greater saphenous vein. I will have vein and vascular look at this. My thoughts have been that these are likely sickle wounds. I looked through  her old records from [facility] wound care center and then when she graduated to [facility] wound care center where she saw Dr. Zigmund Daniel and Dr. Vernona Rieger. Although I can see she had reflux studies done I do not see that she actually saw a vein and vascular. I went over the fact that she had operative debridements and actual skin grafting that did not take. I do not think these wounds have ever really progressed towards healing 1/31;Substantial wounds on the right ankle area. Hyper granulated very gritty adherent debris on the surface. 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 On the left she has no DVT or SVT deep vein reflux in the common femoral vein.  Superficial vein reflux in the greater saphenous vein on although the vein diameter is not really all that large. I do not think there is anything that can be done with these although I am going to send her for consultation to vein and vascular. 2/7; Wound exam; substantial wound area on the right posterior ankle area and areas on the left medial ankle and left lateral ankle. I was able to debride the left medial ankle last week fairly aggressively and it is back this week to a completely nonviable surface She will see vascular surgery this Friday and I would like them to review the venous studies and also any comments on her arterial status. If they do not see an issue here I am going to refer her to plastic surgery for an operative debridement perhaps intraoperative ACell or Integra. Eventually she will require a deep tissue culture again Electronic Signature(s) Signed: 06/26/2021 5:02:53 PM By: Linton Ham MD Entered By: Linton Ham on 06/26/2021 13:44:46 -------------------------------------------------------------------------------- Physical Exam Details Patient Name: Date of Service: [name] [name], [name] 06/26/2021 1:00 PM Medical Record Number: [id number] Patient Account Number: [id number] Date of Birth/Sex: Treating RN: 04/16/1971 (52 y.o. [name] Primary Care Provider: [name] Other Clinician: Referring Provider: Treating Provider/Extender: Farrel Demark Weeks in Treatment: 10 Constitutional Patient is hypertensive.. Pulse regular and within target range for patient.Marland Kitchen Respirations regular, non-labored and within target range.. Temperature is normal and within the target range for the patient.Marland Kitchen Appears in no distress. Notes Wound exam; right lower leg hyper granulated nonviable surface. Left medial ankle. I was disappointed by the complete lack of a viable surface here in spite of the reasonably aggressive debridement I did last week. Left  lateral ankle has not really changed either. No evidence of surrounding infection Electronic Signature(s) Signed: 06/26/2021 5:02:53 PM By: Linton Ham MD Entered By: Linton Ham on 06/26/2021 13:45:45 -------------------------------------------------------------------------------- Physician Orders Details Patient Name: Date of Service: [name] [name], [name] 06/26/2021 1:00 PM Medical Record Number: [id number] Patient Account Number: [id number] Date of Birth/Sex: Treating RN: Apr 23, 1971 (51 y.o. [name] Primary Care Provider: [name] Other Clinician: Referring Provider: Treating Provider/Extender: Farrel Demark Weeks in Treatment: 10 Verbal / Phone Orders: No Diagnosis Coding Follow-up Appointments ppointment in 1 week. - Dr. Dellia Nims. Return A Nurse Visit: - **** Friday 06/29/21 After vein and vascular appointment if the kerlix and coban leg  wraps are removed-come to Wound care center for a nurse visit******* Other: - ****Has [facility] appt 06/29/21 for venous reflux***** Bathing/ Shower/ Hygiene May shower with protection but do not get wound dressing(s) wet. - Can get cast protector bags at [facility] or [facility] Additional Orders / Instructions Follow Nutritious Diet Wound Treatment Wound #17 - Lower Leg Wound Laterality: Right, Lateral Cleanser: Soap and Water 2 x Per Week/30 Days Discharge Instructions: May shower and wash wound with dial antibacterial soap and water prior to dressing change. Cleanser: Wound Cleanser 2 x Per Week/30 Days Discharge Instructions: Cleanse the wound with wound cleanser prior to applying a clean dressing using gauze sponges, not tissue or cotton balls. Peri-Wound Care: Triamcinolone 15 (g) 2 x Per Week/30 Days Discharge Instructions: Use triamcinolone 15 (g) as directed Peri-Wound Care: Sween Lotion (Moisturizing lotion) 2 x Per Week/30 Days Discharge Instructions: Apply moisturizing lotion as directed Prim Dressing:  KerraCel Ag Gelling Fiber Dressing, 4x5 in (silver alginate) 2 x Per Week/30 Days ary Discharge Instructions: Apply silver alginate to wound bed as instructed Secondary Dressing: ABD Pad, 5x9 2 x Per Week/30 Days Discharge Instructions: Apply over primary dressing as directed. Secondary Dressing: Zetuvit Plus 4x8 in 2 x Per Week/30 Days Discharge Instructions: Apply over primary dressing as directed. Compression Wrap: Kerlix Roll 4.5x3.1 (in/yd) 2 x Per Week/30 Days Discharge Instructions: Apply Kerlix and Coban compression as directed. Compression Wrap: Coban Self-Adherent Wrap 4x5 (in/yd) 2 x Per Week/30 Days Discharge Instructions: Apply over Kerlix as directed. Wound #18 - Ankle Wound Laterality: Left, Posterior Cleanser: Soap and Water 2 x Per Week/30 Days Discharge Instructions: May shower and wash wound with dial antibacterial soap and water prior to dressing change. Cleanser: Wound Cleanser 2 x Per Week/30 Days Discharge Instructions: Cleanse the wound with wound cleanser prior to applying a clean dressing using gauze sponges, not tissue or cotton balls. Peri-Wound Care: Triamcinolone 15 (g) 2 x Per Week/30 Days Discharge Instructions: Use triamcinolone 15 (g) as directed Peri-Wound Care: Sween Lotion (Moisturizing lotion) 2 x Per Week/30 Days Discharge Instructions: Apply moisturizing lotion as directed Prim Dressing: KerraCel Ag Gelling Fiber Dressing, 4x5 in (silver alginate) 2 x Per Week/30 Days ary Discharge Instructions: Apply silver alginate to wound bed as instructed Secondary Dressing: ABD Pad, 5x9 2 x Per Week/30 Days Discharge Instructions: Apply over primary dressing as directed. Secondary Dressing: Zetuvit Plus 4x8 in 2 x Per Week/30 Days Discharge Instructions: Apply over primary dressing as directed. Compression Wrap: Kerlix Roll 4.5x3.1 (in/yd) 2 x Per Week/30 Days Discharge Instructions: Apply Kerlix and Coban compression as directed. Compression Wrap: Coban  Self-Adherent Wrap 4x5 (in/yd) 2 x Per Week/30 Days Discharge Instructions: Apply over Kerlix as directed. Wound #19 - Ankle Wound Laterality: Left, Medial Cleanser: Soap and Water 2 x Per Week/30 Days Discharge Instructions: May shower and wash wound with dial antibacterial soap and water prior to dressing change. Cleanser: Wound Cleanser 2 x Per Week/30 Days Discharge Instructions: Cleanse the wound with wound cleanser prior to applying a clean dressing using gauze sponges, not tissue or cotton balls. Peri-Wound Care: Triamcinolone 15 (g) 2 x Per Week/30 Days Discharge Instructions: Use triamcinolone 15 (g) as directed Peri-Wound Care: Sween Lotion (Moisturizing lotion) 2 x Per Week/30 Days Discharge Instructions: Apply moisturizing lotion as directed Prim Dressing: KerraCel Ag Gelling Fiber Dressing, 4x5 in (silver alginate) 2 x Per Week/30 Days ary Discharge Instructions: Apply silver alginate to wound bed as instructed Secondary Dressing: ABD Pad, 5x9 2 x Per Week/30 Days Discharge  Instructions: Apply over primary dressing as directed. Secondary Dressing: Zetuvit Plus 4x8 in 2 x Per Week/30 Days Discharge Instructions: Apply over primary dressing as directed. Compression Wrap: Kerlix Roll 4.5x3.1 (in/yd) 2 x Per Week/30 Days Discharge Instructions: Apply Kerlix and Coban compression as directed. Compression Wrap: Coban Self-Adherent Wrap 4x5 (in/yd) 2 x Per Week/30 Days Discharge Instructions: Apply over Kerlix as directed. Wound #21 - Ankle Wound Laterality: Right, Medial Cleanser: Soap and Water 2 x Per Week/30 Days Discharge Instructions: May shower and wash wound with dial antibacterial soap and water prior to dressing change. Cleanser: Wound Cleanser 2 x Per Week/30 Days Discharge Instructions: Cleanse the wound with wound cleanser prior to applying a clean dressing using gauze sponges, not tissue or cotton balls. Peri-Wound Care: Triamcinolone 15 (g) 2 x Per Week/30  Days Discharge Instructions: Use triamcinolone 15 (g) as directed Peri-Wound Care: Sween Lotion (Moisturizing lotion) 2 x Per Week/30 Days Discharge Instructions: Apply moisturizing lotion as directed Prim Dressing: KerraCel Ag Gelling Fiber Dressing, 4x5 in (silver alginate) 2 x Per Week/30 Days ary Discharge Instructions: Apply silver alginate to wound bed as instructed Secondary Dressing: ABD Pad, 5x9 2 x Per Week/30 Days Discharge Instructions: Apply over primary dressing as directed. Secondary Dressing: Zetuvit Plus 4x8 in 2 x Per Week/30 Days Discharge Instructions: Apply over primary dressing as directed. Compression Wrap: Kerlix Roll 4.5x3.1 (in/yd) 2 x Per Week/30 Days Discharge Instructions: Apply Kerlix and Coban compression as directed. Compression Wrap: Coban Self-Adherent Wrap 4x5 (in/yd) 2 x Per Week/30 Days Discharge Instructions: Apply over Kerlix as directed. Electronic Signature(s) Signed: 06/26/2021 5:02:53 PM By: Linton Ham MD Signed: 06/26/2021 6:07:00 PM By: Dellie Catholic RN Entered By: Dellie Catholic on 06/26/2021 13:52:55 -------------------------------------------------------------------------------- Problem List Details Patient Name: Date of Service: [name], [name] 06/26/2021 1:00 PM Medical Record Number: [id number] Patient Account Number: [id number] Date of Birth/Sex: Treating RN: Nov 15, 1970 (51 y.o. [name] Primary Care Provider: [name] Other Clinician: Referring Provider: Treating Provider/Extender: Farrel Demark Weeks in Treatment: 10 Active Problems ICD-10 Encounter Code Description Active Date MDM Diagnosis L97.818 Non-pressure chronic ulcer of other part of right lower leg with other specified 04/17/2021 No Yes severity L97.828 Non-pressure chronic ulcer of other part of left lower leg with other specified 04/17/2021 No Yes severity 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 04/17/2021 No  Yes Inactive Problems Resolved Problems Electronic Signature(s) Signed: 06/26/2021 5:02:53 PM By: Linton Ham MD Entered By: Linton Ham on 06/26/2021 13:40:02 -------------------------------------------------------------------------------- Progress Note Details Patient Name: Date of Service: [name] [name], [name] 06/26/2021 1:00 PM Medical Record Number: [id number] Patient Account Number: [id number] Date of Birth/Sex: Treating RN: [birth year] (51 y.o. [name] Primary Care Provider: [name] Other Clinician: Referring Provider: Treating Provider/Extender: Farrel Demark Weeks in Treatment: 10 Subjective History of Present Illness (HPI) The following HPI elements were documented for the patient's wound: Location: medial and lateral ankle region on the right and left medial malleolus Quality: Patient reports experiencing a shooting pain to affected area(s). Severity: Patient states wound(s) are getting worse. Duration: right lower extremity bimalleolar ulcers have been present for approximately 2 years; the rright meedial malleolus ulcer has been there proximally 6 months Timing: Pain in wound is constant (hurts all the time) Context: The wound would happen gradually Associated Signs and Symptoms: Patient reports having increase discharge. 51 year old patient with a history of sickle cell anemia who was last seen by me with ulceration of the right lower extremity above the ankle  and was referred to Dr. Leland Johns for a surgical debridement as I was unable to do anything in the office due to excruciating pain. At that stage she was referred from the plastic surgery service to dermatology who treated her for a skin infection with doxycycline and then Levaquin and a local antibiotic ointment. I understand the patient has since developed ulceration on the left ankle both medial and lateral and was now referred back to the wound center as dermatology has  finished the management. I do not have any notes from the dermatology department Old notes: 51 year old patient with a history of sickle cell anemia, pain bilateral lower extremities, right lower extremity ulcer and has a history of receiving a skin graft( Theraskin) several months ago. She has been visiting the wound center [facility] and was seen by Dr. Dellia Nims and Dr. Leland Johns. after prolonged conservator therapy between July 2016 and January 2017. She had been seen by the plastic surgeon and taken to the OR for debridement and application of Theraskin. She had 3 applications of Theraskin and was then treated with collagen. Prior to that she had a history of similar problems in 2014 and was treated conservatively. Had a reflux study done for the right lower extremity in August 2016 without reflux or DVT . Past medical history significant for sickle cell disease, anemia, leg ulcers, cholelithiasis,and has never been a smoker. Once the patient was discharged on the wound center she says within 2 or 3 weeks the problems recurred and she has been treating it conservatively. since I saw her 3 weeks ago at [facility] she has been unable to get her dressing material but has completed a course of doxycycline. 6/7/ 2017 -- lower extremity venous duplex reflux evaluation was done oo No evidence of SVT or DVT in the RLL. No venous incompetence in the RLL. No further vascular workup is indicated at this time. She was seen by Dr. [name], on 10/04/2015. She agreed with the plan of taking her to the OR for debridement and application of theraskin and would also take biopsies to rule out pyoderma gangrenosum. Follow-up note dated May 31 received and she was status post application of Theraskin to multiple ulcers around the right ankle. Pathology did not show evidence of malignancy or pyoderma gangrenosum. She would continue to see as in the wound clinic for further care and see Dr. Leland Johns as  needed. The patient brought the biopsy report and it was consistent with stasis ulcer no evidence of malignancy and the comment was that there was some adjacent neovascularization, fibrosis and patchy perivascular chronic inflammation. 11/15/2015 -- today we applied her first application of Theraskin 11/30/15; TheraSkin #2 12/13/2015 -- she is having a lot of pain locally and is here for possible application of a theraskin today. 01/16/2016 -- the patient has significant pain and has noticed despite in spite of all local care and oral pain medication. It is impossible to debride her in the office. 02/06/2016 -- I do not see any notes from Dr. Iran Planas( the patient has not made a call to the office know as she heard from them) and the only visit to recently was with her PCP Dr. Danella Penton -- I saw her on 01/16/2016 and prescribed 90 tablets of oxycodone 10 mg and did lab work and screening for HIV. the HIV was negative and hemoglobin was 6.3 with a WBC count of 14.9 and hematocrit of 17.8 with platelets of 561. reticulocyte count was 15.5% READMISSION: 07/10/2016- The patient  is here for readmission for bilateral lower extremity ulcers in the presence of sickle cell. The bimalleolar ulcers to the right lower extremity have been present for approximately 2 years, the left medial malleolus ulcer has been present approximately 6 months. She has followed with Dr.Thimmappa in the past and has had a total of 3 applications of Theraskin (01/2015, 09/2015, 06/17/16). She has also followed with Dr. Con Memos here in the clinic and has received 2 applications of TheraSkin (11/10/15, 11/30/15). The patient does experience chronic, and is not amenable to debridement. 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 She is not currently on any antibiotic therapy and has not been treated with any recently. 07/17/2016 -- was seen by Dr. Iran Planas of plastic surgery who saw her 2 weeks  postop application of Theraskin #3. She had removed her dressing and asked her to apply silver alginate on alternate days and follow-up back with the wound center. Future debridements and application of skin substitute would have to be done in the hospital due to her high risk for anesthesia. READMISSION 04/17/2021 Patient is now a 51 year old woman that we have had in this clinic for a prolonged period of time and 2016-2017 and then again for 2 visits in February 2018. At that point she had wounds on the right lower leg predominantly medial. She had also been seen by plastic surgery Dr. Leland Johns who I believe took her to the OR for operative debridement and application of TheraSkin in 2017. After she left our clinic she was followed for a very prolonged period of time in the wound care center in [facility] who then referred her ultimately to [facility] where she was seen by Dr. Vernona Rieger. Again taken her to the OR for skin grafting which apparently did not take. She had multiple other attempts at dressings although I have not really looked over all of these notes in great detail. She has not been seen in a wound care center in about a year. She states over the last year in addition to her right lower leg she has developed wounds on the left lower leg quite extensive. She is using Xeroform to all of these wounds without really any improvement. She also has Medicaid which does not cover wound products. The patient has had vascular work-ups in the past including most recently on 03/28/2021 showing biphasic waveforms on the right triphasic at the PTA and biphasic at the dorsalis pedis on the left. She was unable to tolerate any degree of compression to do ABIs. Unfortunately TBI's were also not done. She had venous reflux studies done in 2017. This did not show any evidence of a DVT or SVT and no venous incompetence was noted in the right leg at the time this was the only side with the wound As noted I did not  look all over her old records. She apparently had a course of HBO and [facility] although I am not sure what the indication would have been. In any case she developed seizures and terminated treatment earlier. She is generally much more disabled than when we last saw her in clinic. She can no longer walk pretty much wheelchair-bound because predominantly of pain in the left hip. 04/24/2021; the patient tolerated the wraps we put on. We used Santyl and Hydrofera Blue under compression. I brought her back for a nurse visit for a change in dressing. With Medicaid we will have a hard time getting anything paid for  and hence the need for compression. She arrives in clinic with all the wounds looking somewhat better in terms of surface 12/20; circumferential wound on the right from the lateral to the medial. She has open areas on the left medial and left lateral x2 on all of this with the same surface. This does not look completely healthy although she does have some epithelialization. She is not complaining of a lot of pain which is unusual for her sickle ulcers. I have not looked over her extensive records from [facility]. She had recent arterial studies and has a history of venous reflux studies I will need to look these over although I do not believe she has significant arterial disease 2023 05/22/2021; patient's wound areas measure slightly smaller. Still a lot of drainage coming from the right we have been using Hydrofera Blue and Santyl with some improvement in the wound surfaces. She tells me she will be getting transfused later in the week for her underlying sickle cell anemia I have looked over her recent arterial studies which were done in the fall. This was in November and showed biphasic and triphasic waveforms but she could not tolerate ABIs because of pressure and unfortunately TBI's were not done. She has not had recent venous reflux studies that I can see 1/10; not much change about the same  surface area. This has a yellowish surface to it very gritty. We have been using Santyl and Hydrofera Blue for a prolonged period. Culture I did last week showed methicillin sensitive staph aureus "rare". Our intake nurse reports greenish drainage which may be the Hydrofera Blue itself 1/17; wounds are continue to measure smaller although I am not sure about the accuracy here. Especially the areas on the right are covered in what looks to be a nonviable surface although she does have some epithelialization. Similarly she has areas on the left medial and left lateral ankle area which appear to have a better surface and perhaps are slightly smaller. We have been using Santyl and Hydrofera Blue. She cannot tolerate mechanical debridements She went for her reflux studies which showed significant reflux at the greater saphenous vein at the saphenofemoral junction as well as the greater saphenous vein in the proximal calf on the left she had reflux in the thigh and the common femoral vein and supra vein Fishel vein reflux in the greater saphenous vein. I will have vein and vascular look at this. My thoughts have been that these are likely sickle wounds. I looked through her old records from [facility] wound care center and then when she graduated to [facility] wound care center where she saw Dr. Zigmund Daniel and Dr. Vernona Rieger. Although I can see she had reflux studies done I do not see that she actually saw a vein and vascular. I went over the fact that she had operative debridements and actual skin grafting that did not take. I do not think these wounds have ever really progressed towards healing 1/31;Substantial wounds on the right ankle area. Hyper granulated very gritty adherent debris on the surface. 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 On the  left she has no DVT or SVT deep vein reflux in the common femoral vein. Superficial vein reflux in the greater saphenous vein on although the vein diameter is not really all that large. I do not think there is anything that can be done with these although I am going to send her for consultation to vein and vascular. 2/7; Wound exam; substantial wound area on the right posterior ankle area and areas on the left medial ankle and left lateral ankle. I was able to debride the left medial ankle last week fairly aggressively and it is back this week to a completely nonviable surface She will see vascular surgery this Friday and I would like them to review the venous studies and also any comments on her arterial status. If they do not see an issue here I am going to refer her to plastic surgery for an operative debridement perhaps intraoperative ACell or Integra. Eventually she will require a deep tissue culture again Objective Constitutional Patient is hypertensive.. Pulse regular and within target range for patient.Marland Kitchen Respirations regular, non-labored and within target range.. Temperature is normal and within the target range for the patient.Marland Kitchen Appears in no distress. Vitals Time Taken: 1:03 PM, Height: 67 in, Source: Stated, Temperature: 98.5 F, Pulse: 84 bpm, Respiratory Rate: 18 breaths/min, Blood Pressure: 150/73 mmHg. General Notes: Wound exam; right lower leg hyper granulated nonviable surface. oo Left medial ankle. I was disappointed by the complete lack of a viable surface here in spite of the reasonably aggressive debridement I did last week. oo Left lateral ankle has not really changed either. oo No evidence of surrounding infection Integumentary (Hair, Skin) Wound #17 status is Open.  Original cause of wound was Gradually Appeared. The date acquired was: 10/05/2012. The wound has been in treatment 10 weeks. The wound is located on the Right,Lateral Lower Leg. The wound measures 11cm length x 6.8cm width x 0.1cm depth; 58.748cm^2 area and 5.875cm^3 volume. There is Fat Layer (Subcutaneous Tissue) exposed. There is no tunneling or undermining noted. There is a large amount of purulent drainage noted. The wound margin is distinct with the outline attached to the wound base. There is medium (34-66%) pink granulation within the wound bed. There is a medium (34-66%) amount of necrotic tissue within the wound bed including Adherent Slough. Wound #18 status is Open. Original cause of wound was Gradually Appeared. The date acquired was: 12/25/2020. The wound has been in treatment 10 weeks. The wound is located on the Left,Posterior Ankle. The wound measures 2.5cm length x 1cm width x 0.1cm depth; 1.963cm^2 area and 0.196cm^3 volume. There is Fat Layer (Subcutaneous Tissue) exposed. There is no tunneling or undermining noted. There is a medium amount of serosanguineous drainage noted. The wound margin is distinct with the outline attached to the wound base. There is large (67-100%) pink granulation within the wound bed. There is a small (1-33%) amount of necrotic tissue within the wound bed including Adherent Slough. Wound #19 status is Open. Original cause of wound was Gradually Appeared. The date acquired was: 12/25/2020. The wound has been in treatment 10 weeks. The wound is located on the Left,Medial Ankle. The wound measures 3cm length x 2cm width x 0.1cm depth; 4.712cm^2 area and 0.471cm^3 volume. There is Fat Layer (Subcutaneous Tissue) exposed. There is no tunneling or undermining noted. There is a medium amount of serosanguineous drainage noted. The wound margin is distinct with the outline attached to the wound base. There is medium (34-66%) pink, hyper - granulation within  the wound bed.  There is a medium (34-66%) amount of necrotic tissue within the wound bed including Adherent Slough. Wound #21 status is Open. Original cause of wound was Gradually Appeared. The date acquired was: 06/26/2021. The wound is located on the Right,Medial Ankle. The wound measures 8.2cm length x 4.5cm width x 0.1cm depth; 28.981cm^2 area and 2.898cm^3 volume. There is Fat Layer (Subcutaneous Tissue) exposed. There is no tunneling or undermining noted. There is a large amount of purulent drainage noted. The wound margin is flat and intact. There is small (1-33%) pink granulation within the wound bed. There is a large (67-100%) amount of necrotic tissue within the wound bed including Adherent Slough. 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 - Dr. Dellia Nims Other: - ****Has [facility] appt 06/29/21 for venous reflux***** Bathing/ Shower/ Hygiene: May shower with protection but do not get wound dressing(s) wet. - Can get cast protector bags at [facility] or [facility] Additional Orders / Instructions: Follow Nutritious Diet WOUND #17: - Lower Leg Wound Laterality: Right, Lateral Cleanser: Soap and Water 2 x Per Week/30 Days Discharge Instructions: May shower and wash wound with dial antibacterial soap and water prior to dressing change. Cleanser: Wound Cleanser 2 x Per Week/30 Days Discharge Instructions: Cleanse the wound with wound cleanser prior to applying a clean dressing using gauze sponges, not tissue or cotton balls. Peri-Wound Care: Triamcinolone 15 (g) 2 x Per Week/30 Days Discharge Instructions: Use triamcinolone 15 (g) as directed Peri-Wound Care: Sween Lotion (Moisturizing lotion) 2 x Per Week/30 Days Discharge Instructions: Apply moisturizing  lotion as directed Prim Dressing: KerraCel Ag Gelling Fiber Dressing, 4x5 in (silver alginate) 2 x Per Week/30 Days ary Discharge Instructions: Apply silver alginate to wound bed as instructed Secondary Dressing: ABD Pad, 5x9 2 x Per Week/30 Days Discharge Instructions: Apply over primary dressing as directed. Secondary Dressing: Zetuvit Plus 4x8 in 2 x Per Week/30 Days Discharge Instructions: Apply over primary dressing as directed. Com pression Wrap: Kerlix Roll 4.5x3.1 (in/yd) 2 x Per Week/30 Days Discharge Instructions: Apply Kerlix and Coban compression as directed. Com pression Wrap: Coban Self-Adherent Wrap 4x5 (in/yd) 2 x Per Week/30 Days Discharge Instructions: Apply over Kerlix as directed. WOUND #18: - Ankle Wound Laterality: Left, Posterior Cleanser: Soap and Water 2 x Per Week/30 Days Discharge Instructions: May shower and wash wound with dial antibacterial soap and water prior to dressing change. Cleanser: Wound Cleanser 2 x Per Week/30 Days Discharge Instructions: Cleanse the wound with wound cleanser prior to applying a clean dressing using gauze sponges, not tissue or cotton balls. Peri-Wound Care: Triamcinolone 15 (g) 2 x Per Week/30 Days Discharge Instructions: Use triamcinolone 15 (g) as directed Peri-Wound Care: Sween Lotion (Moisturizing lotion) 2 x Per Week/30 Days Discharge Instructions: Apply moisturizing lotion as directed Prim Dressing: KerraCel Ag Gelling Fiber Dressing, 4x5 in (silver alginate) 2 x Per Week/30 Days ary Discharge Instructions: Apply silver alginate to wound bed as instructed Secondary Dressing: ABD Pad, 5x9 2 x Per Week/30 Days Discharge Instructions: Apply over primary dressing as directed. Secondary Dressing: Zetuvit Plus 4x8 in 2 x Per Week/30 Days Discharge Instructions: Apply over primary dressing as directed. Com pression Wrap: Kerlix Roll 4.5x3.1 (in/yd) 2 x Per Week/30 Days Discharge Instructions: Apply Kerlix and Coban compression as  directed. Com pression Wrap:  Coban Self-Adherent Wrap 4x5 (in/yd) 2 x Per Week/30 Days Discharge Instructions: Apply over Kerlix as directed. WOUND #19: - Ankle Wound Laterality: Left, Medial Cleanser: Soap and Water 2 x Per Week/30 Days Discharge Instructions: May shower and wash wound with dial antibacterial soap and water prior to dressing change. Cleanser: Wound Cleanser 2 x Per Week/30 Days Discharge Instructions: Cleanse the wound with wound cleanser prior to applying a clean dressing using gauze sponges, not tissue or cotton balls. Peri-Wound Care: Triamcinolone 15 (g) 2 x Per Week/30 Days Discharge Instructions: Use triamcinolone 15 (g) as directed Peri-Wound Care: Sween Lotion (Moisturizing lotion) 2 x Per Week/30 Days Discharge Instructions: Apply moisturizing lotion as directed Prim Dressing: KerraCel Ag Gelling Fiber Dressing, 4x5 in (silver alginate) 2 x Per Week/30 Days ary Discharge Instructions: Apply silver alginate to wound bed as instructed Secondary Dressing: ABD Pad, 5x9 2 x Per Week/30 Days Discharge Instructions: Apply over primary dressing as directed. Secondary Dressing: Zetuvit Plus 4x8 in 2 x Per Week/30 Days Discharge Instructions: Apply over primary dressing as directed. Com pression Wrap: Kerlix Roll 4.5x3.1 (in/yd) 2 x Per Week/30 Days Discharge Instructions: Apply Kerlix and Coban compression as directed. Com pression Wrap: Coban Self-Adherent Wrap 4x5 (in/yd) 2 x Per Week/30 Days Discharge Instructions: Apply over Kerlix as directed. WOUND #21: - Ankle Wound Laterality: Right, Medial Cleanser: Soap and Water 2 x Per Week/30 Days Discharge Instructions: May shower and wash wound with dial antibacterial soap and water prior to dressing change. Cleanser: Wound Cleanser 2 x Per Week/30 Days Discharge Instructions: Cleanse the wound with wound cleanser prior to applying a clean dressing using gauze sponges, not tissue or cotton balls. Peri-Wound Care:  Triamcinolone 15 (g) 2 x Per Week/30 Days Discharge Instructions: Use triamcinolone 15 (g) as directed Peri-Wound Care: Sween Lotion (Moisturizing lotion) 2 x Per Week/30 Days Discharge Instructions: Apply moisturizing lotion as directed Prim Dressing: KerraCel Ag Gelling Fiber Dressing, 4x5 in (silver alginate) 2 x Per Week/30 Days ary Discharge Instructions: Apply silver alginate to wound bed as instructed Secondary Dressing: ABD Pad, 5x9 2 x Per Week/30 Days Discharge Instructions: Apply over primary dressing as directed. Secondary Dressing: Zetuvit Plus 4x8 in 2 x Per Week/30 Days Discharge Instructions: Apply over primary dressing as directed. Com pression Wrap: Kerlix Roll 4.5x3.1 (in/yd) 2 x Per Week/30 Days Discharge Instructions: Apply Kerlix and Coban compression as directed. Com pression Wrap: Coban Self-Adherent Wrap 4x5 (in/yd) 2 x Per Week/30 Days Discharge Instructions: Apply over Kerlix as directed. 1. Our intake nurses noticed copious amounts of drainage change the primary dressing to silver alginate sit to put ABDs under compression 2. Vascular surgery consult on Friday 3. If vascular surgery does not have anything else they want to do or could consider doing and I will refer her to Dr. Claudia Desanctis. She has previously had a skin graft in this area and other graft placements that did not work [Dr. Nurse, adult) Signed: 06/26/2021 5:02:53 PM By: Linton Ham MD Entered By: Linton Ham on 06/26/2021 13:47:08 -------------------------------------------------------------------------------- SuperBill Details Patient Name: Date of Service: [name], [name] [name] 06/26/2021 Medical Record Number: [id number] Patient Account Number: [id number] Date of Birth/Sex: Treating RN: 02-22-1971 (51 y.o. [name] Primary Care Provider: [name] Other Clinician: Referring Provider: Treating Provider/Extender: Farrel Demark Weeks in Treatment:  10 Diagnosis Coding ICD-10 Codes Code Description [id number] Non-pressure chronic ulcer of other part of right lower leg with other specified severity L97.828 Non-pressure chronic ulcer of other part of  left lower leg with other specified severity 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 Facility Procedures CPT4 Code: [id number] Description: 59563 - WOUND CARE VISIT-LEV 3 EST PT Modifier: Quantity: 1 Physician Procedures : CPT4 Code Description Modifier [id number] - WC PHYS LEVEL 3 - EST PT ICD-10 Diagnosis Description L97.818 Non-pressure chronic ulcer of other part of right lower leg with other specified severity L97.828 Non-pressure chronic ulcer of other part of  left lower leg with other specified severity 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 Quantity: 1 Electronic Signature(s) Signed: 06/26/2021 6:07:00 PM By: Dellie Catholic RN Signed: 06/27/2021 4:37:10 PM By: Linton Ham MD Previous Signature: 06/26/2021 5:02:53 PM Version By: Linton Ham MD Entered By: Dellie Catholic on 06/26/2021 18:05:28

## 2021-06-29 ENCOUNTER — Encounter (HOSPITAL_BASED_OUTPATIENT_CLINIC_OR_DEPARTMENT_OTHER): Payer: Medicaid Other | Admitting: Internal Medicine

## 2021-06-29 ENCOUNTER — Encounter: Payer: Self-pay | Admitting: Physician Assistant

## 2021-06-29 ENCOUNTER — Other Ambulatory Visit: Payer: Self-pay

## 2021-06-29 ENCOUNTER — Ambulatory Visit (INDEPENDENT_AMBULATORY_CARE_PROVIDER_SITE_OTHER): Payer: Medicaid Other | Admitting: Physician Assistant

## 2021-06-29 VITALS — BP 127/73 | HR 70 | Temp 98.2°F | Resp 20 | Ht 67.0 in

## 2021-06-29 DIAGNOSIS — I872 Venous insufficiency (chronic) (peripheral): Secondary | ICD-10-CM | POA: Diagnosis not present

## 2021-06-29 DIAGNOSIS — L97818 Non-pressure chronic ulcer of other part of right lower leg with other specified severity: Secondary | ICD-10-CM | POA: Diagnosis not present

## 2021-06-29 NOTE — Progress Notes (Signed)
AUDREE, SCHRECENGOST ([id number]) Visit Report for 06/29/2021 SuperBill Details Patient Name: Date of Service: [name] 06/29/2021 Medical Record Number: [id number] Patient Account Number: [id number] Date of Birth/Sex: Treating RN: 1971/02/25 (51 y.o. [name] Primary Care Provider: [name] Other Clinician: Referring Provider: Treating Provider/Extender: Herma Carson Weeks in Treatment: 10 Diagnosis Coding ICD-10 Codes Code Description [phone] Non-pressure chronic ulcer of other part of right lower leg with other specified severity L97.828 Non-pressure chronic ulcer of other part of left lower leg with other specified severity 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 Facility Procedures CPT4 Code Description Modifier Quantity [id number] - WOUND CARE VISIT-LEV 4 EST PT 1 Electronic Signature(s) Signed: 06/29/2021 12:29:46 PM By: Kalman Shan DO Signed: 06/29/2021 12:55:08 PM By: Baruch Gouty RN, BSN Entered By: Baruch Gouty on 06/29/2021 12:20:04

## 2021-06-29 NOTE — Progress Notes (Signed)
[name], [name] ([id number]) Visit Report for 06/29/2021 Arrival Information Details Patient Name: Date of Service: [name] [name] 06/29/2021 11:00 A M Medical Record Number: [id number] Patient Account Number: [id number] Date of Birth/Sex: Treating RN: 1970/11/27 (51 y.o. [name] Primary Care Kazuto Sevey: [name] Other Clinician: Referring Taj Nevins: Treating Keitha Kolk/Extender: Herma Carson Weeks in Treatment: 10 Visit Information History Since Last Visit Added or deleted any medications: No Patient Arrived: Wheel Chair Any new allergies or adverse reactions: No Arrival Time: 12:15 Had a fall or experienced change in No Accompanied By: self activities of daily living that may affect Transfer Assistance: None risk of falls: Patient Identification Verified: Yes Signs or symptoms of abuse/neglect since last visito No Secondary Verification Process Completed: Yes Hospitalized since last visit: No Patient Requires Transmission-Based Precautions: No Implantable device outside of the clinic excluding No Patient Has Alerts: No cellular tissue based products placed in the center since last visit: Has Dressing in Place as Prescribed: Yes Has Compression in Place as Prescribed: Yes Pain Present Now: Yes Electronic Signature(s) Signed: 06/29/2021 12:55:08 PM By: Baruch Gouty RN, BSN Entered By: Baruch Gouty on 06/29/2021 12:16:19 -------------------------------------------------------------------------------- Clinic Level of Care Assessment Details Patient Name: Date of Service: [name], [place] [name] 06/29/2021 11:00 A M Medical Record Number: [id number] Patient Account Number: [id number] Date of Birth/Sex: Treating RN: 12-04-1970 (51 y.o. [name] Primary Care Messiah Rovira: [name] Other Clinician: Referring Murdis Flitton: Treating Sharmeka Palmisano/Extender: Herma Carson Weeks in Treatment: 10 Clinic Level of Care Assessment  Items TOOL 4 Quantity Score []  - 0 Use when only an EandM is performed on FOLLOW-UP visit ASSESSMENTS - Nursing Assessment / Reassessment X- 1 10 Reassessment of Co-morbidities (includes updates in patient status) X- 1 5 Reassessment of Adherence to Treatment Plan ASSESSMENTS - Wound and Skin A ssessment / Reassessment []  - 0 Simple Wound Assessment / Reassessment - one wound X- 4 5 Complex Wound Assessment / Reassessment - multiple wounds []  - 0 Dermatologic / Skin Assessment (not related to wound area) ASSESSMENTS - Focused Assessment []  - 0 Circumferential Edema Measurements - multi extremities []  - 0 Nutritional Assessment / Counseling / Intervention []  - 0 Lower Extremity Assessment (monofilament, tuning fork, pulses) []  - 0 Peripheral Arterial Disease Assessment (using hand held doppler) ASSESSMENTS - Ostomy and/or Continence Assessment and Care []  - 0 Incontinence Assessment and Management []  - 0 Ostomy Care Assessment and Management (repouching, etc.) PROCESS - Coordination of Care X - Simple Patient / Family Education for ongoing care 1 15 []  - 0 Complex (extensive) Patient / Family Education for ongoing care X- 1 10 Staff obtains Programmer, systems, Records, T Results / Process Orders est []  - 0 Staff telephones HHA, Nursing Homes / Clarify orders / etc []  - 0 Routine Transfer to another Facility (non-emergent condition) []  - 0 Routine Hospital Admission (non-emergent condition) []  - 0 New Admissions / Biomedical engineer / Ordering NPWT Apligraf, etc. , []  - 0 Emergency Hospital Admission (emergent condition) X- 1 10 Simple Discharge Coordination []  - 0 Complex (extensive) Discharge Coordination PROCESS - Special Needs []  - 0 Pediatric / Minor Patient Management []  - 0 Isolation Patient Management []  - 0 Hearing / Language / Visual special needs []  - 0 Assessment of Community assistance (transportation, D/C planning, etc.) []  - 0 Additional  assistance / Altered mentation []  - 0 Support Surface(s) Assessment (bed, cushion, seat, etc.) INTERVENTIONS - Wound Cleansing / Measurement []  - 0 Simple Wound Cleansing - one wound  X- 4 5 Complex Wound Cleansing - multiple wounds []  - 0 Wound Imaging (photographs - any number of wounds) []  - 0 Wound Tracing (instead of photographs) []  - 0 Simple Wound Measurement - one wound []  - 0 Complex Wound Measurement - multiple wounds INTERVENTIONS - Wound Dressings []  - 0 Small Wound Dressing one or multiple wounds X- 2 15 Medium Wound Dressing one or multiple wounds []  - 0 Large Wound Dressing one or multiple wounds []  - 0 Application of Medications - topical []  - 0 Application of Medications - injection INTERVENTIONS - Miscellaneous []  - 0 External ear exam []  - 0 Specimen Collection (cultures, biopsies, blood, body fluids, etc.) []  - 0 Specimen(s) / Culture(s) sent or taken to Lab for analysis []  - 0 Patient Transfer (multiple staff / Civil Service fast streamer / Similar devices) []  - 0 Simple Staple / Suture removal (25 or less) []  - 0 Complex Staple / Suture removal (26 or more) []  - 0 Hypo / Hyperglycemic Management (close monitor of Blood Glucose) []  - 0 Ankle / Brachial Index ([name]) - do not check if billed separately X- 1 5 Vital Signs Has the patient been seen at the hospital within the last three years: Yes Total Score: 125 Level Of Care: New/Established - Level 4 Electronic Signature(s) Signed: 06/29/2021 12:55:08 PM By: Baruch Gouty RN, BSN Entered By: Baruch Gouty on 06/29/2021 12:18:08 -------------------------------------------------------------------------------- Encounter Discharge Information Details Patient Name: Date of Service: [name] URO [name], [name] 06/29/2021 11:00 A M Medical Record Number: [id number] Patient Account Number: [id number] Date of Birth/Sex: Treating RN: [birth year] (51 y.o. [name] Primary Care Ireene Ballowe: [name] Other  Clinician: Referring Annita Ratliff: Treating Ajia Chadderdon/Extender: Herma Carson Weeks in Treatment: 10 Encounter Discharge Information Items Discharge Condition: Stable Ambulatory Status: Wheelchair Discharge Destination: Home Transportation: Private Auto Accompanied By: self Schedule Follow-up Appointment: Yes Clinical Summary of Care: Patient Declined Electronic Signature(s) Signed: 06/29/2021 12:55:08 PM By: Baruch Gouty RN, BSN Entered By: Baruch Gouty on 06/29/2021 12:19:39 -------------------------------------------------------------------------------- Patient/Caregiver Education Details Patient Name: Date of Service: [name], [name] 2/10/2023andnbsp11:00 A M Medical Record Number: [id number] Patient Account Number: [id number] Date of Birth/Gender: Treating RN: February 06, 1971 (51 y.o. [name] Primary Care Physician: [name] Other Clinician: Referring Physician: Treating Physician/Extender: Herma Carson Weeks in Treatment: 10 Education Assessment Education Provided To: Patient Education Topics Provided Venous: Methods: Explain/Verbal Responses: Reinforcements needed, State content correctly Wound/Skin Impairment: Methods: Explain/Verbal Responses: Reinforcements needed, State content correctly Electronic Signature(s) Signed: 06/29/2021 12:55:08 PM By: Baruch Gouty RN, BSN Entered By: Baruch Gouty on 06/29/2021 12:19:02 -------------------------------------------------------------------------------- Wound Assessment Details Patient Name: Date of Service: [name] URO [name], [name] 06/29/2021 11:00 A M Medical Record Number: [id number] Patient Account Number: [id number] Date of Birth/Sex: Treating RN: 12-13-1970 (51 y.o. [name] Primary Care Doyle Tegethoff: [name] Other Clinician: Referring Peng Thorstenson: Treating Larisa Lanius/Extender: Herma Carson Weeks in Treatment: 10 Wound  Status Wound Number: 17 Primary Etiology: Sickle Cell Lesion Wound Location: Right, Lateral Lower Leg Wound Status: Open Wounding Event: Gradually Appeared Date Acquired: 10/05/2012 Weeks Of Treatment: 10 Clustered Wound: Yes Wound Measurements Length: (cm) 11 Width: (cm) 6.8 Depth: (cm) 0.1 Area: (cm) 58.748 Volume: (cm) 5.875 % Reduction in Area: 68.7% % Reduction in Volume: 68.7% Wound Description Classification: Full Thickness Without Exposed Support Structu Exudate Amount: Large Exudate Type: Purulent Exudate Color: yellow, brown, green res Treatment Notes Wound #17 (Lower Leg) Wound Laterality: Right, Lateral Cleanser Soap and Water Discharge Instruction: May shower  and wash wound with dial antibacterial soap and water prior to dressing change. Wound Cleanser Discharge Instruction: Cleanse the wound with wound cleanser prior to applying a clean dressing using gauze sponges, not tissue or cotton balls. Peri-Wound Care Triamcinolone 15 (g) Discharge Instruction: Use triamcinolone 15 (g) as directed Sween Lotion (Moisturizing lotion) Discharge Instruction: Apply moisturizing lotion as directed Topical Primary Dressing KerraCel Ag Gelling Fiber Dressing, 4x5 in (silver alginate) Discharge Instruction: Apply silver alginate to wound bed as instructed Secondary Dressing ABD Pad, 5x9 Discharge Instruction: Apply over primary dressing as directed. Zetuvit Plus 4x8 in Discharge Instruction: Apply over primary dressing as directed. Secured With Compression Wrap Kerlix Roll 4.5x3.1 (in/yd) Discharge Instruction: Apply Kerlix and Coban compression as directed. Coban Self-Adherent Wrap 4x5 (in/yd) Discharge Instruction: Apply over Kerlix as directed. Compression Stockings Add-Ons Electronic Signature(s) Signed: 06/29/2021 12:55:08 PM By: Baruch Gouty RN, BSN Entered By: Baruch Gouty on 06/29/2021  12:17:00 -------------------------------------------------------------------------------- Wound Assessment Details Patient Name: Date of Service: [name] URO [name], [name] 06/29/2021 11:00 A M Medical Record Number: [id number] Patient Account Number: [id number] Date of Birth/Sex: Treating RN: 03-Jun-1970 (51 y.o. [name] Primary Care Salle Brandle: [name] Other Clinician: Referring Erienne Spelman: Treating Macaiah Mangal/Extender: Herma Carson Weeks in Treatment: 10 Wound Status Wound Number: 18 Primary Etiology: Sickle Cell Lesion Wound Location: Left, Posterior Ankle Wound Status: Open Wounding Event: Gradually Appeared Date Acquired: 12/25/2020 Weeks Of Treatment: 10 Clustered Wound: No Wound Measurements Length: (cm) 2.5 Width: (cm) 1 Depth: (cm) 0.1 Area: (cm) 1.963 Volume: (cm) 0.196 % Reduction in Area: 86.1% % Reduction in Volume: 95.4% Wound Description Classification: Full Thickness Without Exposed Support Structu Exudate Amount: Medium Exudate Type: Serosanguineous Exudate Color: red, brown res Treatment Notes Wound #18 (Ankle) Wound Laterality: Left, Posterior Cleanser Soap and Water Discharge Instruction: May shower and wash wound with dial antibacterial soap and water prior to dressing change. Wound Cleanser Discharge Instruction: Cleanse the wound with wound cleanser prior to applying a clean dressing using gauze sponges, not tissue or cotton balls. Peri-Wound Care Triamcinolone 15 (g) Discharge Instruction: Use triamcinolone 15 (g) as directed Sween Lotion (Moisturizing lotion) Discharge Instruction: Apply moisturizing lotion as directed Topical Primary Dressing KerraCel Ag Gelling Fiber Dressing, 4x5 in (silver alginate) Discharge Instruction: Apply silver alginate to wound bed as instructed Secondary Dressing ABD Pad, 5x9 Discharge Instruction: Apply over primary dressing as directed. Zetuvit Plus 4x8 in Discharge Instruction:  Apply over primary dressing as directed. Secured With Compression Wrap Kerlix Roll 4.5x3.1 (in/yd) Discharge Instruction: Apply Kerlix and Coban compression as directed. Coban Self-Adherent Wrap 4x5 (in/yd) Discharge Instruction: Apply over Kerlix as directed. Compression Stockings Add-Ons Electronic Signature(s) Signed: 06/29/2021 12:55:08 PM By: Baruch Gouty RN, BSN Entered By: Baruch Gouty on 06/29/2021 12:17:00 -------------------------------------------------------------------------------- Wound Assessment Details Patient Name: Date of Service: [name] URO [name], [name] 06/29/2021 11:00 A M Medical Record Number: [id number] Patient Account Number: [id number] Date of Birth/Sex: Treating RN: [birth year] (51 y.o. [name] Primary Care Clovis Warwick: [name] Other Clinician: Referring Pearlee Arvizu: Treating Algie Cales/Extender: Herma Carson Weeks in Treatment: 10 Wound Status Wound Number: 19 Primary Etiology: Sickle Cell Lesion Wound Location: Left, Medial Ankle Wound Status: Open Wounding Event: Gradually Appeared Date Acquired: 12/25/2020 Weeks Of Treatment: 10 Clustered Wound: No Wound Measurements Length: (cm) 3 Width: (cm) 2 Depth: (cm) 0.1 Area: (cm) 4.712 Volume: (cm) 0.471 % Reduction in Area: 77.8% % Reduction in Volume: 96.8% Wound Description Classification: Full Thickness Without Exposed Support Structu Exudate Amount: Medium Exudate Type: Serosanguineous  Exudate Color: red, brown res Treatment Notes Wound #19 (Ankle) Wound Laterality: Left, Medial Cleanser Soap and Water Discharge Instruction: May shower and wash wound with dial antibacterial soap and water prior to dressing change. Wound Cleanser Discharge Instruction: Cleanse the wound with wound cleanser prior to applying a clean dressing using gauze sponges, not tissue or cotton balls. Peri-Wound Care Triamcinolone 15 (g) Discharge Instruction: Use triamcinolone 15  (g) as directed Sween Lotion (Moisturizing lotion) Discharge Instruction: Apply moisturizing lotion as directed Topical Primary Dressing KerraCel Ag Gelling Fiber Dressing, 4x5 in (silver alginate) Discharge Instruction: Apply silver alginate to wound bed as instructed Secondary Dressing ABD Pad, 5x9 Discharge Instruction: Apply over primary dressing as directed. Zetuvit Plus 4x8 in Discharge Instruction: Apply over primary dressing as directed. Secured With Compression Wrap Kerlix Roll 4.5x3.1 (in/yd) Discharge Instruction: Apply Kerlix and Coban compression as directed. Coban Self-Adherent Wrap 4x5 (in/yd) Discharge Instruction: Apply over Kerlix as directed. Compression Stockings Add-Ons Electronic Signature(s) Signed: 06/29/2021 12:55:08 PM By: Baruch Gouty RN, BSN Entered By: Baruch Gouty on 06/29/2021 12:17:00 -------------------------------------------------------------------------------- Wound Assessment Details Patient Name: Date of Service: [name] URO [name], [name] 06/29/2021 11:00 A M Medical Record Number: [id number] Patient Account Number: [id number] Date of Birth/Sex: Treating RN: 1970-08-21 (51 y.o. [name] Primary Care Ayane Delancey: [name] Other Clinician: Referring Shaliah Wann: Treating Tim Wilhide/Extender: Herma Carson Weeks in Treatment: 10 Wound Status Wound Number: 21 Primary Etiology: Sickle Cell Lesion Wound Location: Right, Medial Ankle Secondary Etiology: Venous Leg Ulcer Wounding Event: Gradually Appeared Wound Status: Open Date Acquired: 06/26/2021 Weeks Of Treatment: 0 Clustered Wound: No Wound Measurements Length: (cm) 8.2 Width: (cm) 4.5 Depth: (cm) 0.1 Area: (cm) 28.981 Volume: (cm) 2.898 % Reduction in Area: 0% % Reduction in Volume: 0% Wound Description Classification: Full Thickness Without Exposed Support Stru Exudate Amount: Large Exudate Type: Purulent Exudate Color: yellow, brown,  green Treatment Notes Wound #21 (Ankle) Wound Laterality: Right, Medial Cleanser Soap and Water Discharge Instruction: May shower and wash wound wit Wound Cleanser Discharge Instruction: Cleanse the wound with wound balls. ctures h dial antibacterial soap and water prior to dressing change. cleanser prior to applying a clean dressing using gauze sponges, not tissue or cotton Peri-Wound Care Triamcinolone 15 (g) Discharge Instruction: Use triamcinolone 15 (g) as directed Sween Lotion (Moisturizing lotion) Discharge Instruction: Apply moisturizing lotion as directed Topical Primary Dressing KerraCel Ag Gelling Fiber Dressing, 4x5 in (silver alginate) Discharge Instruction: Apply silver alginate to wound bed as instructed Secondary Dressing ABD Pad, 5x9 Discharge Instruction: Apply over primary dressing as directed. Zetuvit Plus 4x8 in Discharge Instruction: Apply over primary dressing as directed. Secured With Compression Wrap Kerlix Roll 4.5x3.1 (in/yd) Discharge Instruction: Apply Kerlix and Coban compression as directed. Coban Self-Adherent Wrap 4x5 (in/yd) Discharge Instruction: Apply over Kerlix as directed. Compression Stockings Add-Ons Electronic Signature(s) Signed: 06/29/2021 12:55:08 PM By: Baruch Gouty RN, BSN Entered By: Baruch Gouty on 06/29/2021 12:17:00

## 2021-07-03 ENCOUNTER — Encounter (HOSPITAL_BASED_OUTPATIENT_CLINIC_OR_DEPARTMENT_OTHER): Payer: Medicaid Other | Admitting: Internal Medicine

## 2021-07-03 ENCOUNTER — Other Ambulatory Visit: Payer: Self-pay | Admitting: Family Medicine

## 2021-07-03 ENCOUNTER — Other Ambulatory Visit: Payer: Medicaid Other

## 2021-07-03 ENCOUNTER — Other Ambulatory Visit: Payer: Self-pay

## 2021-07-03 ENCOUNTER — Other Ambulatory Visit: Payer: Self-pay | Admitting: Nurse Practitioner

## 2021-07-03 DIAGNOSIS — G894 Chronic pain syndrome: Secondary | ICD-10-CM

## 2021-07-03 DIAGNOSIS — L97818 Non-pressure chronic ulcer of other part of right lower leg with other specified severity: Secondary | ICD-10-CM | POA: Diagnosis not present

## 2021-07-03 DIAGNOSIS — D571 Sickle-cell disease without crisis: Secondary | ICD-10-CM

## 2021-07-03 DIAGNOSIS — E559 Vitamin D deficiency, unspecified: Secondary | ICD-10-CM

## 2021-07-03 NOTE — Progress Notes (Signed)
SHAYNAH, HUND ([id number]) Visit Report for 07/03/2021 HPI Details Patient Name: Date of Service: [name] 07/03/2021 10:15 A M Medical Record Number: [id number] Patient Account Number: [id number] Date of Birth/Sex: Treating RN: 04-11-1971 (51 y.o. [name] Primary Care Provider: [name] Other Clinician: Referring Provider: Treating Provider/Extender: Farrel Demark Weeks in Treatment: 11 History of Present Illness Location: medial and lateral ankle region on the right and left medial malleolus Quality: Patient reports experiencing a shooting pain to affected area(s). Severity: Patient states wound(s) are getting worse. Duration: right lower extremity bimalleolar ulcers have been present for approximately 2 years; the rright meedial malleolus ulcer has been there proximally 6 months Timing: Pain in wound is constant (hurts all the time) Context: The wound would happen gradually ssociated Signs and Symptoms: Patient reports having increase discharge. A HPI Description: 51 year old patient with a history of sickle cell anemia who was last seen by me with ulceration of the right lower extremity above the ankle and was referred to Dr. Leland Johns for a surgical debridement as I was unable to do anything in the office due to excruciating pain. At that stage she was referred from the plastic surgery service to dermatology who treated her for a skin infection with doxycycline and then Levaquin and a local antibiotic ointment. I understand the patient has since developed ulceration on the left ankle both medial and lateral and was now referred back to the wound center as dermatology has finished the management. I do not have any notes from the dermatology department Old notes: 51 year old patient with a history of sickle cell anemia, pain bilateral lower extremities, right lower extremity ulcer and has a history of receiving a skin graft( Theraskin)  several months ago. She has been visiting the wound center [facility] and was seen by Dr. Dellia Nims and Dr. Leland Johns. after prolonged conservator therapy between July 2016 and January 2017. She had been seen by the plastic surgeon and taken to the OR for debridement and application of Theraskin. She had 3 applications of Theraskin and was then treated with collagen. Prior to that she had a history of similar problems in 2014 and was treated conservatively. Had a reflux study done for the right lower extremity in August 2016 without reflux or DVT . Past medical history significant for sickle cell disease, anemia, leg ulcers, cholelithiasis,and has never been a smoker. Once the patient was discharged on the wound center she says within 2 or 3 weeks the problems recurred and she has been treating it conservatively. since I saw her 3 weeks ago at [facility] she has been unable to get her dressing material but has completed a course of doxycycline. 6/7/ 2017 -- lower extremity venous duplex reflux evaluation was done No evidence of SVT or DVT in the RLL. No venous incompetence in the RLL. No further vascular workup is indicated at this time. She was seen by Dr. [name], on 10/04/2015. She agreed with the plan of taking her to the OR for debridement and application of theraskin and would also take biopsies to rule out pyoderma gangrenosum. Follow-up note dated May 31 received and she was status post application of Theraskin to multiple ulcers around the right ankle. Pathology did not show evidence of malignancy or pyoderma gangrenosum. She would continue to see as in the wound clinic for further care and see Dr. Leland Johns as needed. The patient brought the biopsy report and it was consistent with stasis ulcer no evidence of malignancy and  the comment was that there was some adjacent neovascularization, fibrosis and patchy perivascular chronic inflammation. 11/15/2015 -- today we applied her first  application of Theraskin 11/30/15; TheraSkin #2 12/13/2015 -- she is having a lot of pain locally and is here for possible application of a theraskin today. 01/16/2016 -- the patient has significant pain and has noticed despite in spite of all local care and oral pain medication. It is impossible to debride her in the office. 02/06/2016 -- I do not see any notes from Dr. Iran Planas( the patient has not made a call to the office know as she heard from them) and the only visit to recently was with her PCP Dr. Danella Penton -- I saw her on 01/16/2016 and prescribed 90 tablets of oxycodone 10 mg and did lab work and screening for HIV. the HIV was negative and hemoglobin was 6.3 with a WBC count of 14.9 and hematocrit of 17.8 with platelets of 561. 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 The bimalleolar ulcers to the right lower extremity have been present for approximately 2 years, the left medial malleolus ulcer has been present approximately 6 months. She has followed with Dr.Thimmappa in the past and has had a total of 3 applications of Theraskin (01/2015, 09/2015, 06/17/16). She has also followed with Dr. Con Memos here in the clinic and has received 2 applications of TheraSkin (11/10/15, 11/30/15). The patient does experience chronic, and is not amenable to debridement. 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 She is not currently on any antibiotic therapy and has not been treated with any recently. 07/17/2016 -- was seen by Dr. Iran Planas of plastic surgery who saw her 2 weeks postop application of Theraskin #3. She had removed her dressing and asked her to apply silver alginate on alternate days and follow-up back with the wound center. Future debridements and application of skin substitute would have to be done in the hospital due to her high risk  for anesthesia. READMISSION 04/17/2021 Patient is now a 51 year old woman that we have had in this clinic for a prolonged period of time and 2016-2017 and then again for 2 visits in February 2018. At that point she had wounds on the right lower leg predominantly medial. She had also been seen by plastic surgery Dr. Leland Johns who I believe took her to the OR for operative debridement and application of TheraSkin in 2017. After she left our clinic she was followed for a very prolonged period of time in the wound care center in [facility] who then referred her ultimately to [facility] where she was seen by Dr. Vernona Rieger. Again taken her to the OR for skin grafting which apparently did not take. She had multiple other attempts at dressings although I have not really looked over all of these notes in great detail. She has not been seen in a wound care center in about a year. She states over the last year in addition to her right lower leg she has developed wounds on the left lower leg quite extensive. She is using Xeroform to all of these wounds without really any improvement. She also has Medicaid which does not cover wound products. The patient has had vascular work-ups in the past including most recently on 03/28/2021 showing biphasic waveforms on the right triphasic at the PTA and biphasic at the dorsalis pedis on the left. She  was unable to tolerate any degree of compression to do ABIs. Unfortunately TBI's were also not done. She had venous reflux studies done in 2017. This did not show any evidence of a DVT or SVT and no venous incompetence was noted in the right leg at the time this was the only side with the wound As noted I did not look all over her old records. She apparently had a course of HBO and [facility] although I am not sure what the indication would have been. In any case she developed seizures and terminated treatment earlier. She is generally much more disabled than when we last saw her in  clinic. She can no longer walk pretty much wheelchair-bound because predominantly of pain in the left hip. 04/24/2021; the patient tolerated the wraps we put on. We used Santyl and Hydrofera Blue under compression. I brought her back for a nurse visit for a change in dressing. With Medicaid we will have a hard time getting anything paid for and hence the need for compression. She arrives in clinic with all the wounds looking somewhat better in terms of surface 12/20; circumferential wound on the right from the lateral to the medial. She has open areas on the left medial and left lateral x2 on all of this with the same surface. This does not look completely healthy although she does have some epithelialization. She is not complaining of a lot of pain which is unusual for her sickle ulcers. I have not looked over her extensive records from [facility]. She had recent arterial studies and has a history of venous reflux studies I will need to look these over although I do not believe she has significant arterial disease 2023 05/22/2021; patient's wound areas measure slightly smaller. Still a lot of drainage coming from the right we have been using Hydrofera Blue and Santyl with some improvement in the wound surfaces. She tells me she will be getting transfused later in the week for her underlying sickle cell anemia I have looked over her recent arterial studies which were done in the fall. This was in November and showed biphasic and triphasic waveforms but she could not tolerate ABIs because of pressure and unfortunately TBI's were not done. She has not had recent venous reflux studies that I can see 1/10; not much change about the same surface area. This has a yellowish surface to it very gritty. We have been using Santyl and Hydrofera Blue for a prolonged period. Culture I did last week showed methicillin sensitive staph aureus "rare". Our intake nurse reports greenish drainage which may be the Hydrofera  Blue itself 1/17; wounds are continue to measure smaller although I am not sure about the accuracy here. Especially the areas on the right are covered in what looks to be a nonviable surface although she does have some epithelialization. Similarly she has areas on the left medial and left lateral ankle area which appear to have a better surface and perhaps are slightly smaller. We have been using Santyl and Hydrofera Blue. She cannot tolerate mechanical debridements She went for her reflux studies which showed significant reflux at the greater saphenous vein at the saphenofemoral junction as well as the greater saphenous vein in the proximal calf on the left she had reflux in the thigh and the common femoral vein and supra vein Fishel vein reflux in the greater saphenous vein. I will have vein and vascular look at this. My thoughts have been that these are likely sickle wounds. I looked  through her old records from [facility] wound care center and then when she graduated to [facility] wound care center where she saw Dr. Zigmund Daniel and Dr. Vernona Rieger. Although I can see she had reflux studies done I do not see that she actually saw a vein and vascular. I went over the fact that she had operative debridements and actual skin grafting that did not take. I do not think these wounds have ever really progressed towards healing 1/31;Substantial wounds on the right ankle area. Hyper granulated very gritty adherent debris on the surface. 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 On the left she has no DVT or SVT deep vein reflux in the common femoral  vein. Superficial vein reflux in the greater saphenous vein on although the vein diameter is not really all that large. I do not think there is anything that can be done with these although I am going to send her for consultation to vein and vascular. 2/7; Wound exam; substantial wound area on the right posterior ankle area and areas on the left medial ankle and left lateral ankle. I was able to debride the left medial ankle last week fairly aggressively and it is back this week to a completely nonviable surface She will see vascular surgery this Friday and I would like them to review the venous studies and also any comments on her arterial status. If they do not see an issue here I am going to refer her to plastic surgery for an operative debridement perhaps intraoperative ACell or Integra. Eventually she will require a deep tissue culture again 2/14; substantial wound area on the right posterior ankle, medial ankle. We have been using silver alginate The patient was seen by vein and vascular she had both venous reflux studies and arterial studies. 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 She had no evidence of superficial venous thrombosis. She did have superficial vein reflux at the saphenofemoral junction and the greater saphenous vein. On the left no evidence of a DVT no evidence of superficial venous throat thrombosis she did have deep vein reflux in the common femoral vein and superficial vein reflux in the greater saphenous vein but these were not felt to be amenable to ablation. In terms of arterial studies she had triphasic and wife biphasic wave waveforms bilaterally not felt to have a significant arterial issue. I do not get the feeling that they felt that any part of her nonhealing wounds were related to either arterial or venous issues. They did note that she had venous reflux at the right at the [facility] and [facility].  And also on the left there were reflux in the deep system at the common femoral vein and greater saphenous vein in the proximal thigh. Nothing amenable to ablation. Electronic Signature(s) Signed: 07/03/2021 4:15:21 PM By: Linton Ham MD Entered By: Linton Ham on 07/03/2021 11:59:39 -------------------------------------------------------------------------------- Physical Exam Details Patient Name: Date of Service: [name], [name] 07/03/2021 10:15 A M Medical Record Number: [id number] Patient Account Number: [id number] Date of  Birth/Sex: Treating RN: [birth year] (51 y.o. [name] Primary Care Provider: [name] Other Clinician: Referring Provider: Treating Provider/Extender: Farrel Demark Weeks in Treatment: 11 Cardiovascular Pedal pulses are palpable. No edema. Notes Wound exam substantial area on the right lower leg posteriorly extending medially. Small wounds on the left medial and left lateral ankle I think we have made some progress here. Electronic Signature(s) Signed: 07/03/2021 4:15:21 PM By: Linton Ham MD Entered By: Linton Ham on 07/03/2021 12:00:30 -------------------------------------------------------------------------------- Physician Orders Details Patient Name: Date of Service: [name], [name] 07/03/2021 10:15 A M Medical Record Number: [id number] Patient Account Number: [id number] Date of Birth/Sex: Treating RN: 1971-02-28 (51 y.o. [name] Primary Care Provider: [name] Other Clinician: Referring Provider: Treating Provider/Extender: Bernette Redbird in Treatment: 11 Verbal / Phone Orders: No Diagnosis Coding ICD-10 Coding Code Description L97.818 Non-pressure chronic ulcer of other part of right lower leg with other specified severity L97.828 Non-pressure chronic ulcer of other part of left lower leg with other specified severity D57.1 Sickle-cell disease without  crisis Follow-up Appointments ppointment in 1 week. - Dr. Dellia Nims. Return A Other: - ****Referral to Plastic Surgeon (Dr Claudia Desanctis)***** Bathing/ Shower/ Hygiene May shower with protection but do not get wound dressing(s) wet. - Can get cast protector bags at [facility] or [facility] Additional Orders / Instructions Follow Nutritious Diet Wound Treatment Wound #17 - Lower Leg Wound Laterality: Right, Lateral Cleanser: Soap and Water 2 x Per Week/30 Days Discharge Instructions: May shower and wash wound with dial antibacterial soap and water prior to dressing change. Cleanser: Wound Cleanser 2 x Per Week/30 Days Discharge Instructions: Cleanse the wound with wound cleanser prior to applying a clean dressing using gauze sponges, not tissue or cotton balls. Peri-Wound Care: Triamcinolone 15 (g) 2 x Per Week/30 Days Discharge Instructions: Use triamcinolone 15 (g) as directed Peri-Wound Care: Sween Lotion (Moisturizing lotion) 2 x Per Week/30 Days Discharge Instructions: Apply moisturizing lotion as directed Prim Dressing: KerraCel Ag Gelling Fiber Dressing, 4x5 in (silver alginate) 2 x Per Week/30 Days ary Discharge Instructions: Apply silver alginate to wound bed as instructed Prim Dressing: Santyl Ointment 2 x Per Week/30 Days ary Discharge Instructions: Apply nickel thick amount to wound bed as instructed Secondary Dressing: ABD Pad, 5x9 2 x Per Week/30 Days Discharge Instructions: Apply over primary dressing as directed. Secondary Dressing: Zetuvit Plus 4x8 in 2 x Per Week/30 Days Discharge Instructions: Apply over primary dressing as directed. Compression Wrap: Kerlix Roll 4.5x3.1 (in/yd) 2 x Per Week/30 Days Discharge Instructions: Apply Kerlix and Coban compression as directed. Compression Wrap: Coban Self-Adherent Wrap 4x5 (in/yd) 2 x Per Week/30 Days Discharge Instructions: Apply over Kerlix as directed. Wound #18 - Ankle Wound Laterality: Left, Posterior Cleanser: Soap and Water 2 x Per  Week/30 Days Discharge Instructions: May shower and wash wound with dial antibacterial soap and water prior to dressing change. Cleanser: Wound Cleanser 2 x Per Week/30 Days Discharge Instructions: Cleanse the wound with wound cleanser prior to applying a clean dressing using gauze sponges, not tissue or cotton balls. Peri-Wound Care: Triamcinolone 15 (g) 2 x Per Week/30 Days Discharge Instructions: Use triamcinolone 15 (g) as directed Peri-Wound Care: Sween Lotion (Moisturizing lotion) 2 x Per Week/30 Days Discharge Instructions: Apply moisturizing lotion as directed Prim Dressing: KerraCel Ag Gelling Fiber Dressing, 4x5 in (silver alginate) 2 x Per Week/30 Days ary Discharge Instructions: Apply silver alginate to wound bed as instructed Secondary Dressing: ABD Pad, 5x9 2 x Per Week/30  Days Discharge Instructions: Apply over primary dressing as directed. Secondary Dressing: Zetuvit Plus 4x8 in 2 x Per Week/30 Days Discharge Instructions: Apply over primary dressing as directed. Compression Wrap: Kerlix Roll 4.5x3.1 (in/yd) 2 x Per Week/30 Days Discharge Instructions: Apply Kerlix and Coban compression as directed. Compression Wrap: Coban Self-Adherent Wrap 4x5 (in/yd) 2 x Per Week/30 Days Discharge Instructions: Apply over Kerlix as directed. Wound #19 - Ankle Wound Laterality: Left, Medial Cleanser: Soap and Water 2 x Per Week/30 Days Discharge Instructions: May shower and wash wound with dial antibacterial soap and water prior to dressing change. Cleanser: Wound Cleanser 2 x Per Week/30 Days Discharge Instructions: Cleanse the wound with wound cleanser prior to applying a clean dressing using gauze sponges, not tissue or cotton balls. Peri-Wound Care: Triamcinolone 15 (g) 2 x Per Week/30 Days Discharge Instructions: Use triamcinolone 15 (g) as directed Peri-Wound Care: Sween Lotion (Moisturizing lotion) 2 x Per Week/30 Days Discharge Instructions: Apply moisturizing lotion as  directed Prim Dressing: KerraCel Ag Gelling Fiber Dressing, 4x5 in (silver alginate) 2 x Per Week/30 Days ary Discharge Instructions: Apply silver alginate to wound bed as instructed Prim Dressing: Santyl Ointment 2 x Per Week/30 Days ary Discharge Instructions: Apply nickel thick amount to wound bed as instructed Secondary Dressing: ABD Pad, 5x9 2 x Per Week/30 Days Discharge Instructions: Apply over primary dressing as directed. Secondary Dressing: Zetuvit Plus 4x8 in 2 x Per Week/30 Days Discharge Instructions: Apply over primary dressing as directed. Compression Wrap: Kerlix Roll 4.5x3.1 (in/yd) 2 x Per Week/30 Days Discharge Instructions: Apply Kerlix and Coban compression as directed. Compression Wrap: Coban Self-Adherent Wrap 4x5 (in/yd) 2 x Per Week/30 Days Discharge Instructions: Apply over Kerlix as directed. Wound #21 - Ankle Wound Laterality: Right, Medial Cleanser: Soap and Water 2 x Per Week/30 Days Discharge Instructions: May shower and wash wound with dial antibacterial soap and water prior to dressing change. Cleanser: Wound Cleanser 2 x Per Week/30 Days Discharge Instructions: Cleanse the wound with wound cleanser prior to applying a clean dressing using gauze sponges, not tissue or cotton balls. Peri-Wound Care: Triamcinolone 15 (g) 2 x Per Week/30 Days Discharge Instructions: Use triamcinolone 15 (g) as directed Peri-Wound Care: Sween Lotion (Moisturizing lotion) 2 x Per Week/30 Days Discharge Instructions: Apply moisturizing lotion as directed Prim Dressing: KerraCel Ag Gelling Fiber Dressing, 4x5 in (silver alginate) 2 x Per Week/30 Days ary Discharge Instructions: Apply silver alginate to wound bed as instructed Secondary Dressing: ABD Pad, 5x9 2 x Per Week/30 Days Discharge Instructions: Apply over primary dressing as directed. Secondary Dressing: Zetuvit Plus 4x8 in 2 x Per Week/30 Days Discharge Instructions: Apply over primary dressing as  directed. Compression Wrap: Kerlix Roll 4.5x3.1 (in/yd) 2 x Per Week/30 Days Discharge Instructions: Apply Kerlix and Coban compression as directed. Compression Wrap: Coban Self-Adherent Wrap 4x5 (in/yd) 2 x Per Week/30 Days Discharge Instructions: Apply over Kerlix as directed. Consults Plastic Surgery - Referral to Plastic Surgeon, Dr.Pace. Large non healing wounds, bilateral lower legs. Evalute for O.R. debridement. - (ICD10 [id number] - Non-pressure chronic ulcer of other part of right lower leg with other specified severity) Electronic Signature(s) Signed: 07/03/2021 4:15:21 PM By: Linton Ham MD Signed: 07/03/2021 5:20:25 PM By: Dellie Catholic RN Entered By: Dellie Catholic on 07/03/2021 12:08:00 Prescription 07/03/2021 -------------------------------------------------------------------------------- Kassie Mends MD Patient Name: Provider: 1970/09/28 [id number] Date of Birth: NPI#Jesse Sans Sex: DEA #: [phone] [id number] Phone #: License #: [place] Patient Address: [address] [address] , [place],  [address] [phone] Allergies No Known Allergies Provider's Orders Plastic Surgery - ICD10: L97.818 - Referral to Plastic Surgeon, Dr.Pace. Large non healing wounds, bilateral lower legs. Evalute for O.R. debridement. Hand Signature: Date(s): Electronic Signature(s) Signed: 07/03/2021 4:15:21 PM By: Linton Ham MD Signed: 07/03/2021 5:20:25 PM By: Dellie Catholic RN Entered By: Dellie Catholic on 07/03/2021 12:08:02 -------------------------------------------------------------------------------- Problem List Details Patient Name: Date of Service: [name], [name] 07/03/2021 10:15 A M Medical Record Number: [id number] Patient Account Number: [id number] Date of Birth/Sex: Treating RN: [birth year] (51 y.o. [name] Primary Care Provider: [name] Other Clinician: Referring Provider: Treating Provider/Extender: Farrel Demark Weeks in Treatment: 11 Active Problems ICD-10 Encounter Code Description Active Date MDM Diagnosis L97.818 Non-pressure chronic ulcer of other part of right lower leg with other specified 04/17/2021 No Yes severity L97.828 Non-pressure chronic ulcer of other part of left lower leg with other specified 04/17/2021 No Yes severity 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 04/17/2021 No Yes Inactive Problems Resolved Problems Electronic Signature(s) Signed: 07/03/2021 4:15:21 PM By: Linton Ham MD Entered By: Linton Ham on 07/03/2021 11:51:20 -------------------------------------------------------------------------------- Progress Note Details Patient Name: Date of Service: [name], [name] 07/03/2021 10:15 A M Medical Record Number: [id number] Patient Account Number: [id number] Date of Birth/Sex: Treating RN: Dec 10, 1970 (51 y.o. [name] Primary Care Provider: [name] Other Clinician: Referring Provider: Treating Provider/Extender: Farrel Demark Weeks in Treatment: 11 Subjective History of Present Illness (HPI) The following HPI elements were documented for the patient's wound: Location: medial and lateral ankle region on the right and left medial malleolus Quality: Patient reports experiencing a shooting pain to affected area(s). Severity: Patient states wound(s) are getting worse. Duration: right lower extremity bimalleolar ulcers have been present for approximately 2 years; the rright meedial malleolus ulcer has been there proximally 6 months Timing: Pain in wound is constant (hurts all the time) Context: The wound would happen gradually Associated Signs and Symptoms: Patient reports having increase discharge. 51 year old patient with a history of sickle cell anemia who was last seen by me with ulceration of the right lower  extremity above the ankle and was referred to Dr. Leland Johns for a surgical debridement as I was unable to do anything in the office due to excruciating pain. At that stage she was referred from the plastic surgery service to dermatology who treated her for a skin infection with doxycycline and then Levaquin and a local antibiotic ointment. I understand the patient has since developed ulceration on the left ankle both medial and lateral and was now referred back to the wound center as dermatology has finished the management. I do not have any notes from the dermatology department Old notes: 51 year old patient with a history of sickle cell anemia, pain bilateral lower extremities, right lower extremity ulcer and has a history of receiving a skin graft( Theraskin) several months ago. She has been visiting the wound center [facility] and was seen by Dr. Dellia Nims and Dr. Leland Johns. after prolonged conservator therapy between July 2016 and January 2017. She had been seen by the plastic surgeon and taken to the OR for debridement and application of Theraskin. She had 3 applications of Theraskin and was then treated with collagen. Prior to that she had a history of similar problems in 2014 and was treated conservatively. Had a reflux study done for the right lower extremity in August 2016 without reflux or DVT .  Past medical history significant for sickle cell disease, anemia, leg ulcers, cholelithiasis,and has never been a smoker. Once the patient was discharged on the wound center she says within 2 or 3 weeks the problems recurred and she has been treating it conservatively. since I saw her 3 weeks ago at [facility] she has been unable to get her dressing material but has completed a course of doxycycline. 6/7/ 2017 -- lower extremity venous duplex reflux evaluation was done oo No evidence of SVT or DVT in the RLL. No venous incompetence in the RLL. No further vascular workup is indicated at this time. She  was seen by Dr. [name], on 10/04/2015. She agreed with the plan of taking her to the OR for debridement and application of theraskin and would also take biopsies to rule out pyoderma gangrenosum. Follow-up note dated May 31 received and she was status post application of Theraskin to multiple ulcers around the right ankle. Pathology did not show evidence of malignancy or pyoderma gangrenosum. She would continue to see as in the wound clinic for further care and see Dr. Leland Johns as needed. The patient brought the biopsy report and it was consistent with stasis ulcer no evidence of malignancy and the comment was that there was some adjacent neovascularization, fibrosis and patchy perivascular chronic inflammation. 11/15/2015 -- today we applied her first application of Theraskin 11/30/15; TheraSkin #2 12/13/2015 -- she is having a lot of pain locally and is here for possible application of a theraskin today. 01/16/2016 -- the patient has significant pain and has noticed despite in spite of all local care and oral pain medication. It is impossible to debride her in the office. 02/06/2016 -- I do not see any notes from Dr. Iran Planas( the patient has not made a call to the office know as she heard from them) and the only visit to recently was with her PCP Dr. Danella Penton -- I saw her on 01/16/2016 and prescribed 90 tablets of oxycodone 10 mg and did lab work and screening for HIV. the HIV was negative and hemoglobin was 6.3 with a WBC count of 14.9 and hematocrit of 17.8 with platelets of 561. 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 The bimalleolar ulcers to the right lower extremity have been present for approximately 2 years, the left medial malleolus ulcer has been present approximately 6 months. She has followed with Dr.Thimmappa in the past and has had a total of 3  applications of Theraskin (01/2015, 09/2015, 06/17/16). She has also followed with Dr. Con Memos here in the clinic and has received 2 applications of TheraSkin (11/10/15, 11/30/15). The patient does experience chronic, and is not amenable to debridement. 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 She is not currently on any antibiotic therapy and has not been treated with any recently. 07/17/2016 -- was seen by Dr. Iran Planas of plastic surgery who saw her 2 weeks postop application of Theraskin #3. She had removed her dressing and asked her to apply silver alginate on alternate days and follow-up back with the wound center. Future debridements and application of skin substitute would have to be done in the hospital due to her high risk for anesthesia. READMISSION 04/17/2021 Patient is now a 51 year old woman that we have had in this clinic for a prolonged period of time and 2016-2017 and then again for 2 visits in February  2018. At that point she had wounds on the right lower leg predominantly medial. She had also been seen by plastic surgery Dr. Leland Johns who I believe took her to the OR for operative debridement and application of TheraSkin in 2017. After she left our clinic she was followed for a very prolonged period of time in the wound care center in [facility] who then referred her ultimately to [facility] where she was seen by Dr. Vernona Rieger. Again taken her to the OR for skin grafting which apparently did not take. She had multiple other attempts at dressings although I have not really looked over all of these notes in great detail. She has not been seen in a wound care center in about a year. She states over the last year in addition to her right lower leg she has developed wounds on the left lower leg quite extensive. She is using Xeroform to all of these wounds without really any improvement. She also has Medicaid which does not cover wound products. The patient  has had vascular work-ups in the past including most recently on 03/28/2021 showing biphasic waveforms on the right triphasic at the PTA and biphasic at the dorsalis pedis on the left. She was unable to tolerate any degree of compression to do ABIs. Unfortunately TBI's were also not done. She had venous reflux studies done in 2017. This did not show any evidence of a DVT or SVT and no venous incompetence was noted in the right leg at the time this was the only side with the wound As noted I did not look all over her old records. She apparently had a course of HBO and [facility] although I am not sure what the indication would have been. In any case she developed seizures and terminated treatment earlier. She is generally much more disabled than when we last saw her in clinic. She can no longer walk pretty much wheelchair-bound because predominantly of pain in the left hip. 04/24/2021; the patient tolerated the wraps we put on. We used Santyl and Hydrofera Blue under compression. I brought her back for a nurse visit for a change in dressing. With Medicaid we will have a hard time getting anything paid for and hence the need for compression. She arrives in clinic with all the wounds looking somewhat better in terms of surface 12/20; circumferential wound on the right from the lateral to the medial. She has open areas on the left medial and left lateral x2 on all of this with the same surface. This does not look completely healthy although she does have some epithelialization. She is not complaining of a lot of pain which is unusual for her sickle ulcers. I have not looked over her extensive records from [facility]. She had recent arterial studies and has a history of venous reflux studies I will need to look these over although I do not believe she has significant arterial disease 2023 05/22/2021; patient's wound areas measure slightly smaller. Still a lot of drainage coming from the right we have been using  Hydrofera Blue and Santyl with some improvement in the wound surfaces. She tells me she will be getting transfused later in the week for her underlying sickle cell anemia I have looked over her recent arterial studies which were done in the fall. This was in November and showed biphasic and triphasic waveforms but she could not tolerate ABIs because of pressure and unfortunately TBI's were not done. She has not had recent venous reflux studies that I  can see 1/10; not much change about the same surface area. This has a yellowish surface to it very gritty. We have been using Santyl and Hydrofera Blue for a prolonged period. Culture I did last week showed methicillin sensitive staph aureus "rare". Our intake nurse reports greenish drainage which may be the Hydrofera Blue itself 1/17; wounds are continue to measure smaller although I am not sure about the accuracy here. Especially the areas on the right are covered in what looks to be a nonviable surface although she does have some epithelialization. Similarly she has areas on the left medial and left lateral ankle area which appear to have a better surface and perhaps are slightly smaller. We have been using Santyl and Hydrofera Blue. She cannot tolerate mechanical debridements She went for her reflux studies which showed significant reflux at the greater saphenous vein at the saphenofemoral junction as well as the greater saphenous vein in the proximal calf on the left she had reflux in the thigh and the common femoral vein and supra vein Fishel vein reflux in the greater saphenous vein. I will have vein and vascular look at this. My thoughts have been that these are likely sickle wounds. I looked through her old records from [facility] wound care center and then when she graduated to [facility] wound care center where she saw Dr. Zigmund Daniel and Dr. Vernona Rieger. Although I can see she had reflux studies done I do not see that she actually saw a vein and  vascular. I went over the fact that she had operative debridements and actual skin grafting that did not take. I do not think these wounds have ever really progressed towards healing 1/31;Substantial wounds on the right ankle area. Hyper granulated very gritty adherent debris on the surface. 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 On the left she has no DVT or SVT deep vein reflux in the common femoral vein. Superficial vein reflux in the greater saphenous vein on although the vein diameter is not really all that large. I do not think there is anything that can be done with these although I am going to send her for consultation to vein and vascular. 2/7; Wound exam; substantial wound area on the right posterior ankle area and areas on the left medial ankle and left lateral ankle. I was able to debride the left medial ankle last week fairly aggressively and it is back this week to a completely nonviable surface She will see vascular surgery this Friday and I would like them to review the venous studies and also any comments on her arterial status. If they do not see an issue here I am going to refer her to plastic surgery for an operative debridement perhaps intraoperative ACell or Integra. Eventually she will require a deep tissue culture again 2/14; substantial wound area on the right posterior ankle, medial ankle. We have been using silver alginate The patient was seen by vein and vascular she had both venous reflux studies and arterial studies. 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 She had no evidence of superficial venous thrombosis. She did have superficial vein reflux at the saphenofemoral junction and the greater saphenous vein. On the left no evidence of a DVT no evidence of superficial venous throat thrombosis she did have deep vein reflux in the common femoral vein and superficial vein reflux in the greater saphenous vein but these were not felt to be amenable to ablation. In terms of arterial studies she had triphasic and wife biphasic wave waveforms bilaterally not felt to have a significant arterial issue. I do not get the feeling that they felt that any part of her nonhealing wounds were related to either arterial or venous issues. They did note that she had venous reflux at the right at the [facility] and [facility]. And also on the left there were reflux in the deep system at the common femoral vein and greater saphenous vein in the proximal thigh. Nothing amenable to ablation. Objective Constitutional Vitals Time Taken: 10:06 AM, Height: 67 in, Temperature: 98.0 F, Pulse: 73 bpm, Respiratory Rate: 18 breaths/min, Blood Pressure: 124/77 mmHg. Cardiovascular Pedal pulses are palpable. No edema. General Notes: Wound exam substantial area on the right lower leg posteriorly extending medially. Small wounds on the left medial and left lateral ankle I think we have made some progress here. Integumentary (Hair, Skin) Wound #17 status is Open. Original cause of wound was Gradually Appeared. The date acquired was: 10/05/2012. The wound has been in treatment 11 weeks. The wound is located on the Right,Lateral Lower Leg. The wound measures 9cm length x 5.5cm width x 0.1cm depth; 38.877cm^2 area and 3.888cm^3 volume. There is Fat Layer (Subcutaneous Tissue) exposed. There is no tunneling or undermining noted. There is a large amount of purulent drainage noted. There is small (1-33%) red granulation within the wound bed. There is a large (67-100%) amount of  necrotic tissue within the wound bed including Adherent Slough. Wound #18 status is Open. Original cause of wound was Gradually Appeared. The date acquired was: 12/25/2020. The wound has been in treatment 11 weeks. The wound is located on the Left,Posterior Ankle. The wound measures 1.8cm length x 0.5cm width x 0.1cm depth; 0.707cm^2 area and 0.071cm^3 volume. There is Fat Layer (Subcutaneous Tissue) exposed. There is no tunneling or undermining noted. There is a medium amount of serosanguineous drainage noted. There is medium (34-66%) red granulation within the wound bed. There is a medium (34-66%) amount of necrotic tissue within the wound bed including Adherent Slough. Wound #19 status is Open. Original cause of wound was Gradually Appeared. The date acquired was: 12/25/2020. The wound has been in treatment 11 weeks. The wound is located on the Left,Medial Ankle. The wound measures 2cm length x 1.6cm width x 0.1cm depth; 2.513cm^2 area and 0.251cm^3 volume. There is Fat Layer (Subcutaneous Tissue) exposed. There is no tunneling or undermining noted. There is a medium amount of serosanguineous drainage noted. There is small (1-33%) red granulation within the wound bed. There is a large (67-100%) amount of necrotic tissue within the wound bed including Adherent Slough. Wound #21 status is Open. Original cause of wound was Gradually Appeared. The date acquired was: 06/26/2021. The wound has been in treatment 1 weeks. The wound is located on the Right,Medial Ankle. The wound measures 7.3cm length x 4.1cm width x 0.1cm depth; 23.507cm^2 area and 2.351cm^3 volume. There is Fat Layer (Subcutaneous Tissue) exposed. There is no tunneling or undermining noted. There is a large amount of purulent drainage noted. There is small (  1- 33%) red granulation within the wound bed. There is a large (67-100%) amount of necrotic tissue within the wound bed including Adherent Slough. 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 - Dr. Dellia Nims. Nurse Visit: - **** Friday 06/29/21 After vein and vascular appointment if the kerlix and coban leg wraps are removed-come to Wound care center for a nurse visit******* Other: - ****Referral to Plastic Surgeon (Dr Claudia Desanctis)***** Bathing/ Shower/ Hygiene: May shower with protection but do not get wound dressing(s) wet. - Can get cast protector bags at [facility] or [facility] Additional Orders / Instructions: Follow Nutritious Diet WOUND #17: - Lower Leg Wound Laterality: Right, Lateral Cleanser: Soap and Water 2 x Per Week/30 Days Discharge Instructions: May shower and wash wound with dial antibacterial soap and water prior to dressing change. Cleanser: Wound Cleanser 2 x Per Week/30 Days Discharge Instructions: Cleanse the wound with wound cleanser prior to applying a clean dressing using gauze sponges, not tissue or cotton balls. Peri-Wound Care: Triamcinolone 15 (g) 2 x Per Week/30 Days Discharge Instructions: Use triamcinolone 15 (g) as directed Peri-Wound Care: Sween Lotion (Moisturizing lotion) 2 x Per Week/30 Days Discharge Instructions: Apply moisturizing lotion as directed Prim Dressing: KerraCel Ag Gelling Fiber Dressing, 4x5 in (silver alginate) 2 x Per Week/30 Days ary Discharge Instructions: Apply silver alginate to wound bed as instructed Prim Dressing: Santyl Ointment 2 x Per Week/30 Days ary Discharge Instructions: Apply nickel thick amount to wound bed as instructed Secondary Dressing: ABD Pad, 5x9 2 x Per Week/30 Days Discharge Instructions: Apply over primary dressing as directed. Secondary Dressing: Zetuvit Plus 4x8 in 2 x Per Week/30 Days Discharge Instructions: Apply over  primary dressing as directed. Com pression Wrap: Kerlix Roll 4.5x3.1 (in/yd) 2 x Per Week/30 Days Discharge Instructions: Apply Kerlix and Coban compression as directed. Com pression Wrap: Coban Self-Adherent Wrap 4x5 (in/yd) 2 x Per Week/30 Days Discharge Instructions: Apply over Kerlix as directed. WOUND #18: - Ankle Wound Laterality: Left, Posterior Cleanser: Soap and Water 2 x Per Week/30 Days Discharge Instructions: May shower and wash wound with dial antibacterial soap and water prior to dressing change. Cleanser: Wound Cleanser 2 x Per Week/30 Days Discharge Instructions: Cleanse the wound with wound cleanser prior to applying a clean dressing using gauze sponges, not tissue or cotton balls. Peri-Wound Care: Triamcinolone 15 (g) 2 x Per Week/30 Days Discharge Instructions: Use triamcinolone 15 (g) as directed Peri-Wound Care: Sween Lotion (Moisturizing lotion) 2 x Per Week/30 Days Discharge Instructions: Apply moisturizing lotion as directed Prim Dressing: KerraCel Ag Gelling Fiber Dressing, 4x5 in (silver alginate) 2 x Per Week/30 Days ary Discharge Instructions: Apply silver alginate to wound bed as instructed Secondary Dressing: ABD Pad, 5x9 2 x Per Week/30 Days Discharge Instructions: Apply over primary dressing as directed. Secondary Dressing: Zetuvit Plus 4x8 in 2 x Per Week/30 Days Discharge Instructions: Apply over primary dressing as directed. Com pression Wrap: Kerlix Roll 4.5x3.1 (in/yd) 2 x Per Week/30 Days Discharge Instructions: Apply Kerlix and Coban compression as directed. Com pression Wrap: Coban Self-Adherent Wrap 4x5 (in/yd) 2 x Per Week/30 Days Discharge Instructions: Apply over Kerlix as directed. WOUND #19: - Ankle Wound Laterality: Left, Medial Cleanser: Soap and Water 2 x Per Week/30 Days Discharge Instructions: May shower and wash wound with dial antibacterial soap  and water prior to dressing change. Cleanser: Wound Cleanser 2 x Per Week/30 Days Discharge  Instructions: Cleanse the wound with wound cleanser prior to applying a clean dressing using gauze sponges, not tissue or cotton balls. Peri-Wound Care: Triamcinolone 15 (g) 2 x Per Week/30 Days Discharge Instructions: Use triamcinolone 15 (g) as directed Peri-Wound Care: Sween Lotion (Moisturizing lotion) 2 x Per Week/30 Days Discharge Instructions: Apply moisturizing lotion as directed Prim Dressing: KerraCel Ag Gelling Fiber Dressing, 4x5 in (silver alginate) 2 x Per Week/30 Days ary Discharge Instructions: Apply silver alginate to wound bed as instructed Prim Dressing: Santyl Ointment 2 x Per Week/30 Days ary Discharge Instructions: Apply nickel thick amount to wound bed as instructed Secondary Dressing: ABD Pad, 5x9 2 x Per Week/30 Days Discharge Instructions: Apply over primary dressing as directed. Secondary Dressing: Zetuvit Plus 4x8 in 2 x Per Week/30 Days Discharge Instructions: Apply over primary dressing as directed. Com pression Wrap: Kerlix Roll 4.5x3.1 (in/yd) 2 x Per Week/30 Days Discharge Instructions: Apply Kerlix and Coban compression as directed. Com pression Wrap: Coban Self-Adherent Wrap 4x5 (in/yd) 2 x Per Week/30 Days Discharge Instructions: Apply over Kerlix as directed. WOUND #21: - Ankle Wound Laterality: Right, Medial Cleanser: Soap and Water 2 x Per Week/30 Days Discharge Instructions: May shower and wash wound with dial antibacterial soap and water prior to dressing change. Cleanser: Wound Cleanser 2 x Per Week/30 Days Discharge Instructions: Cleanse the wound with wound cleanser prior to applying a clean dressing using gauze sponges, not tissue or cotton balls. Peri-Wound Care: Triamcinolone 15 (g) 2 x Per Week/30 Days Discharge Instructions: Use triamcinolone 15 (g) as directed Peri-Wound Care: Sween Lotion (Moisturizing lotion) 2 x Per Week/30 Days Discharge Instructions: Apply moisturizing lotion as directed Prim Dressing: KerraCel Ag Gelling Fiber  Dressing, 4x5 in (silver alginate) 2 x Per Week/30 Days ary Discharge Instructions: Apply silver alginate to wound bed as instructed Secondary Dressing: ABD Pad, 5x9 2 x Per Week/30 Days Discharge Instructions: Apply over primary dressing as directed. Secondary Dressing: Zetuvit Plus 4x8 in 2 x Per Week/30 Days Discharge Instructions: Apply over primary dressing as directed. Com pression Wrap: Kerlix Roll 4.5x3.1 (in/yd) 2 x Per Week/30 Days Discharge Instructions: Apply Kerlix and Coban compression as directed. Com pression Wrap: Coban Self-Adherent Wrap 4x5 (in/yd) 2 x Per Week/30 Days Discharge Instructions: Apply over Kerlix as directed. 1. I added Santyl under the silver alginate still under the same compression 2. The patient has sickle cell ulcers bilaterally. She has some degree of bilateral chronic venous insufficiency but there is nothing here that would warrant ablation. They also did not feel she had significant arterial insufficiency 3. I am going to refer her to Dr. Claudia Desanctis of plastic surgery to see if he would be willing to debride the wound areas perhaps place an operative advanced product such as ACell or Integra. Noteworthy that she has already been through plastic surgery by Dr. Vernona Rieger who I think tried to graft but it did not take. 4. There is no way I can get her through any part of the debridement in this clinic Electronic Signature(s) Signed: 07/03/2021 4:15:21 PM By: Linton Ham MD Entered By: Linton Ham on 07/03/2021 12:02:05 -------------------------------------------------------------------------------- SuperBill Details Patient Name: Date of Service: [place], [name] 07/03/2021 Medical Record Number: [id number] Patient Account Number: [id number] Date of Birth/Sex: Treating RN: [birth year] (51 y.o. 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 Facility Procedures CPT4 Code: [id number] Description: [phone] - WOUND CARE VISIT-LEV 5 EST PT Modifier: Quantity: 1 Physician Procedures : CPT4 Code Description Modifier [id number] - WC PHYS LEVEL 4 - EST PT ICD-10 Diagnosis Description L97.818 Non-pressure chronic ulcer of other part of right lower leg with other specified severity L97.828 Non-pressure chronic ulcer of other part of  left lower leg with other specified severity 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 Quantity: 1 Electronic Signature(s) Signed: 07/03/2021 4:15:21 PM By: Linton Ham MD Signed: 07/03/2021 5:20:25 PM By: Dellie Catholic RN Entered By: Dellie Catholic on 07/03/2021 15:53:04

## 2021-07-03 NOTE — Progress Notes (Unsigned)
Orders Placed This Encounter  Procedures   Sickle Cell Panel    Standing Status:   Future    Standing Expiration Date:   07/03/2022

## 2021-07-04 LAB — CBC WITH DIFFERENTIAL/PLATELET
Basophils Absolute: 0.2 10*3/uL (ref 0.0–0.2)
Basos: 2 %
EOS (ABSOLUTE): 0.8 10*3/uL — ABNORMAL HIGH (ref 0.0–0.4)
Eos: 7 %
Hematocrit: 22.8 % — ABNORMAL LOW (ref 34.0–46.6)
Hemoglobin: 7.8 g/dL — ABNORMAL LOW (ref 11.1–15.9)
Immature Grans (Abs): 0.1 10*3/uL (ref 0.0–0.1)
Immature Granulocytes: 1 %
Lymphocytes Absolute: 2.2 10*3/uL (ref 0.7–3.1)
Lymphs: 21 %
MCH: 29.7 pg (ref 26.6–33.0)
MCHC: 34.2 g/dL (ref 31.5–35.7)
MCV: 87 fL (ref 79–97)
Monocytes Absolute: 1.3 10*3/uL — ABNORMAL HIGH (ref 0.1–0.9)
Monocytes: 12 %
NRBC: 4 % — ABNORMAL HIGH (ref 0–0)
Neutrophils Absolute: 6.3 10*3/uL (ref 1.4–7.0)
Neutrophils: 57 %
Platelets: 405 10*3/uL (ref 150–450)
RBC: 2.63 x10E6/uL — CL (ref 3.77–5.28)
RDW: 18.8 % — ABNORMAL HIGH (ref 11.7–15.4)
WBC: 10.9 10*3/uL — ABNORMAL HIGH (ref 3.4–10.8)

## 2021-07-04 LAB — COMPREHENSIVE METABOLIC PANEL
ALT: 31 IU/L (ref 0–32)
AST: 47 IU/L — ABNORMAL HIGH (ref 0–40)
Albumin/Globulin Ratio: 1.3 (ref 1.2–2.2)
Albumin: 4.5 g/dL (ref 3.8–4.8)
Alkaline Phosphatase: 66 IU/L (ref 44–121)
BUN/Creatinine Ratio: 20 (ref 9–23)
BUN: 9 mg/dL (ref 6–24)
Bilirubin Total: 2.5 mg/dL — ABNORMAL HIGH (ref 0.0–1.2)
CO2: 21 mmol/L (ref 20–29)
Calcium: 9.4 mg/dL (ref 8.7–10.2)
Chloride: 102 mmol/L (ref 96–106)
Creatinine, Ser: 0.45 mg/dL — ABNORMAL LOW (ref 0.57–1.00)
Globulin, Total: 3.4 g/dL (ref 1.5–4.5)
Glucose: 70 mg/dL (ref 70–99)
Potassium: 4.9 mmol/L (ref 3.5–5.2)
Sodium: 137 mmol/L (ref 134–144)
Total Protein: 7.9 g/dL (ref 6.0–8.5)
eGFR: 118 mL/min/{1.73_m2} (ref >59)

## 2021-07-09 ENCOUNTER — Other Ambulatory Visit: Payer: Self-pay | Admitting: Family Medicine

## 2021-07-09 ENCOUNTER — Encounter (HOSPITAL_BASED_OUTPATIENT_CLINIC_OR_DEPARTMENT_OTHER): Payer: Medicaid Other | Admitting: Internal Medicine

## 2021-07-09 ENCOUNTER — Other Ambulatory Visit: Payer: Self-pay

## 2021-07-09 DIAGNOSIS — L97818 Non-pressure chronic ulcer of other part of right lower leg with other specified severity: Secondary | ICD-10-CM | POA: Diagnosis not present

## 2021-07-09 NOTE — Progress Notes (Signed)
[name], [name] ([id number]) Visit Report for 07/09/2021 Debridement Details Patient Name: Date of Service: [name] [name] 07/09/2021 10:00 A M Medical Record Number: [id number] Patient Account Number: [id number] Date of Birth/Sex: Treating [name]: [birth year] (51 y.o. [name] Other Clinician: Referring [name]: Treating [name]/Extender: [name] in Treatment: 11 Debridement Performed for Assessment: Wound #17 Right,Lateral Lower Leg Performed By: Clinician [name] Gouty, [name] Debridement Type: Chemical/Enzymatic/Mechanical Agent Used: Santyl Level of Consciousness (Pre-procedure): Awake and Alert Pre-procedure Verification/Time Out No Taken: Bleeding: None Response to Treatment: Procedure was tolerated well Level of Consciousness (Post- Awake and Alert procedure): Post Debridement Measurements of Total Wound Length: (cm) 9.2 Width: (cm) 5 Depth: (cm) 0.1 Volume: (cm) 3.613 Character of Wound/Ulcer Post Debridement: Requires Further Debridement Post Procedure Diagnosis Same as Pre-procedure Electronic Signature(s) Signed: 07/09/2021 2:53:35 PM By: [name] Ham MD Signed: 07/09/2021 5:43:45 PM By: [name] Gouty [name], BSN Entered By: [name] on 07/09/2021 11:00:01 -------------------------------------------------------------------------------- Debridement Details Patient Name: Date of Service: [name] 07/09/2021 10:00 A M Medical Record Number: [id number] Patient Account Number: [id number] Date of Birth/Sex: Treating [name]: 01/15/1971 (51 y.o. [name] Other Clinician: Referring [name]: Treating [name]/Extender: [name] [name] in Treatment: 11 Debridement Performed for Assessment: Wound #18 Left,Posterior Ankle Performed By: [name] Debridement Type:  Chemical/Enzymatic/Mechanical Agent Used: Santyl Level of Consciousness (Pre-procedure): Awake and Alert Pre-procedure Verification/Time Out No Taken: Bleeding: None Response to Treatment: Procedure was tolerated well Level of Consciousness (Post- Awake and Alert procedure): Post Debridement Measurements of Total Wound Length: (cm) 0.9 Width: (cm) 0.3 Depth: (cm) 0.1 Volume: (cm) 0.021 Character of Wound/Ulcer Post Debridement: Requires Further Debridement Post Procedure Diagnosis Same as Pre-procedure Electronic Signature(s) Signed: 07/09/2021 2:53:35 PM By: [name] Ham MD Signed: 07/09/2021 5:43:45 PM By: [name] Gouty [name], BSN Entered By: [name] on 07/09/2021 11:00:26 -------------------------------------------------------------------------------- Debridement Details Patient Name: Date of Service: [name] 07/09/2021 10:00 A M Medical Record Number: [id number] Patient Account Number: [id number] Date of Birth/Sex: Treating [name]: 30-Aug-1970 (51 y.o. [name] Other Clinician: Referring [name]: Treating [name]/Extender: [name] [name] in Treatment: 11 Debridement Performed for Assessment: Wound #19 Left,Medial Ankle Performed By: Clinician [name] Gouty, [name] Debridement Type: Chemical/Enzymatic/Mechanical Agent Used: Santyl Level of Consciousness (Pre-procedure): Awake and Alert Pre-procedure Verification/Time Out No Taken: Bleeding: None Response to Treatment: Procedure was tolerated well Level of Consciousness (Post- Awake and Alert procedure): Post Debridement Measurements of Total Wound Length: (cm) 2 Width: (cm) 1.9 Depth: (cm) 0.1 Volume: (cm) 0.298 Character of Wound/Ulcer Post Debridement: Requires Further Debridement Post Procedure Diagnosis Same as Pre-procedure Electronic Signature(s) Signed: 07/09/2021 2:53:35 PM By: [name] Ham MD Signed: 07/09/2021  5:43:45 PM By: [name] Gouty [name], BSN Entered By: [name] on 07/09/2021 11:00:52 -------------------------------------------------------------------------------- Debridement Details Patient Name: Date of Service: [name] 07/09/2021 10:00 A M Medical Record Number: [id number] Patient Account Number: [id number] Date of Birth/Sex: Treating [name]: 1970-09-20 (51 y.o. [name] Other Clinician: Referring [name]: Treating [name]/Extender: [name] in Treatment: 11 Debridement Performed for Assessment: Wound #21 Right,Medial Ankle Performed By: Clinician [name] Gouty, [name] Debridement Type: Chemical/Enzymatic/Mechanical Agent Used: Santyl Severity of Tissue Pre Debridement: Fat layer exposed Level of Consciousness (Pre-procedure): Awake and Alert Pre-procedure Verification/Time Out No Taken: Bleeding: None Response to Treatment: Procedure was tolerated well Level  of Consciousness (Post- Awake and Alert procedure): Post Debridement Measurements of Total Wound Length: (cm) 7.3 Width: (cm) 3.8 Depth: (cm) 0.1 Volume: (cm) 2.179 Character of Wound/Ulcer Post Debridement: Requires Further Debridement Severity of Tissue Post Debridement: Fat layer exposed Post Procedure Diagnosis Same as Pre-procedure Electronic Signature(s) Signed: 07/09/2021 2:53:35 PM By: [name] Ham MD Signed: 07/09/2021 5:43:45 PM By: [name] Gouty [name], BSN Entered By: [name] on 07/09/2021 11:01:28 -------------------------------------------------------------------------------- HPI Details Patient Name: Date of Service: [name] 07/09/2021 10:00 A M Medical Record Number: [id number] Patient Account Number: [id number] Date of Birth/Sex: Treating [name]: 1970-12-09 (51 y.o. F) Primary Care [name]: [name] Other Clinician: Referring [name]: Treating [name]/Extender: [name] [name] in Treatment: 11 History of Present Illness Location: medial and lateral ankle region on the right and left medial malleolus Quality: Patient reports experiencing a shooting pain to affected area(s). Severity: Patient states wound(s) are getting worse. Duration: right lower extremity bimalleolar ulcers have been present for approximately 2 years; the rright meedial malleolus ulcer has been there proximally 6 months Timing: Pain in wound is constant (hurts all the time) Context: The wound would happen gradually ssociated Signs and Symptoms: Patient reports having increase discharge. 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 Leland Johns for a surgical debridement as I was unable to do anything in the office due to excruciating pain. At that stage she was referred from the plastic surgery service to dermatology who treated her for a skin infection with doxycycline and then Levaquin and a local antibiotic ointment. I understand the patient has since developed ulceration on the left ankle both medial and lateral and was now referred back to the wound center as dermatology has finished the management. 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 She has been visiting the wound center [facility] and was seen by Dr. Dellia Nims and Dr. Leland Johns. after prolonged conservator therapy between July 2016 and January 2017. She had been seen by the plastic surgeon and taken to the OR for debridement and application of Theraskin. She had 3 applications of Theraskin and was then treated with collagen. Prior to that she had a history  of similar problems in 2014 and was treated conservatively. Had a reflux study done for the right lower extremity in August 2016 without reflux or DVT . Past medical history significant for [name] cell disease, anemia, leg ulcers, cholelithiasis,and has never been a smoker. Once the patient was discharged on the wound center she says within 2 or 3 [name] the problems recurred and she has been treating it conservatively. since I saw her 3 [name] ago at [facility] she has been unable to get her dressing material but has completed a course of doxycycline. 6/7/ 2017 -- lower extremity venous duplex reflux evaluation was done No evidence of SVT or DVT in the RLL. No venous incompetence in the RLL. No further vascular workup is indicated at this time. She was seen by Dr. [name], on 10/04/2015. She agreed with the plan of taking her to the OR for debridement and application of theraskin and would also take biopsies to rule out pyoderma gangrenosum. Follow-up note  dated May 31 received and she was status post application of Theraskin to multiple ulcers around the right ankle. Pathology did not show evidence of malignancy or pyoderma gangrenosum. She would continue to see as in the wound clinic for further care and see Dr. Leland Johns as needed. The patient brought the biopsy report and it was consistent with stasis ulcer no evidence of malignancy and the comment was that there was some adjacent neovascularization, fibrosis and patchy perivascular chronic inflammation. 11/15/2015 -- today we applied her first application of Theraskin 11/30/15; TheraSkin #2 12/13/2015 -- she is having a lot of pain locally and is here for possible application of a theraskin today. 01/16/2016 -- the patient has significant pain and has noticed despite in spite of all local care and oral pain medication. It is impossible to debride her in the office. 02/06/2016 -- I do not see any notes from Dr. Iran Planas( the patient has  not made a call to the office know as she heard from them) and the only visit to recently was with her PCP Dr. Danella Penton -- I saw her on 01/16/2016 and prescribed 90 tablets of oxycodone 10 mg and did lab work and screening for HIV. the HIV was negative and hemoglobin was 6.3 with a WBC count of 14.9 and hematocrit of 17.8 with platelets of 561. reticulocyte count was 15.5% READMISSION: 07/10/2016- The patient is here for readmission for bilateral lower extremity ulcers in the presence of [name] cell. The bimalleolar ulcers to the right lower extremity have been present for approximately 2 years, the left medial malleolus ulcer has been present approximately 6 months. She has followed with Dr.Thimmappa in the past and has had a total of 3 applications of Theraskin (01/2015, 09/2015, 06/17/16). She has also followed with Dr. Con Memos here in the clinic and has received 2 applications of TheraSkin (11/10/15, 11/30/15). The patient does experience chronic, and is not amenable to debridement. She had a [name] cell crisis in December 2017, prior to that has been several years. She is not currently on any antibiotic therapy and has not been treated with any recently. 07/17/2016 -- was seen by Dr. Iran Planas of plastic surgery who saw her 2 [name] postop application of Theraskin #3. She had removed her dressing and asked her to apply silver alginate on alternate days and follow-up back with the wound center. Future debridements and application of skin substitute would have to be done in the hospital due to her high risk for anesthesia. READMISSION 04/17/2021 Patient is now a 51 year old woman that we have had in this clinic for a prolonged period of time and 2016-2017 and then again for 2 visits in February 2018. At that point she had wounds on the right lower leg predominantly medial. She had also been seen by plastic surgery Dr. Leland Johns who I believe took her to the OR for operative debridement and application  of TheraSkin in 2017. After she left our clinic she was followed for a very prolonged period of time in the wound care center in [facility] who then referred her ultimately to [facility] where she was seen by Dr. Vernona Rieger. Again taken her to the OR for skin grafting which apparently did not take. She had multiple other attempts at dressings although I have not really looked over all of these notes in great detail. She has not been seen in a wound care center in about a year. She states over the last year in addition to her right lower leg she has  developed wounds on the left lower leg quite extensive. She is using Xeroform to all of these wounds without really any improvement. She also has Medicaid which does not cover wound products. The patient has had vascular work-ups in the past including most recently on 03/28/2021 showing biphasic waveforms on the right triphasic at the PTA and biphasic at the dorsalis pedis on the left. She was unable to tolerate any degree of compression to do ABIs. Unfortunately TBI's were also not done. She had venous reflux studies done in 2017. This did not show any evidence of a DVT or SVT and no venous incompetence was noted in the right leg at the time this was the only side with the wound As noted I did not look all over her old records. She apparently had a course of HBO and [facility] although I am not sure what the indication would have been. In any case she developed seizures and terminated treatment earlier. She is generally much more disabled than when we last saw her in clinic. She can no longer walk pretty much wheelchair-bound because predominantly of pain in the left hip. 04/24/2021; the patient tolerated the wraps we put on. We used Santyl and Hydrofera Blue under compression. I brought her back for a nurse visit for a change in dressing. With Medicaid we will have a hard time getting anything paid for and hence the need for compression. She arrives in clinic with all  the wounds looking somewhat better in terms of surface 12/20; circumferential wound on the right from the lateral to the medial. She has open areas on the left medial and left lateral x2 on all of this with the same surface. This does not look completely healthy although she does have some epithelialization. She is not complaining of a lot of pain which is unusual for her [name] ulcers. I have not looked over her extensive records from [facility]. She had recent arterial studies and has a history of venous reflux studies I will need to look these over although I do not believe she has significant arterial disease 2023 05/22/2021; patient's wound areas measure slightly smaller. Still a lot of drainage coming from the right we have been using Hydrofera Blue and Santyl with some improvement in the wound surfaces. 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 This was in November and showed biphasic and triphasic waveforms but she could not tolerate ABIs because of pressure and unfortunately TBI's were not done. She has not had recent venous reflux studies that I can see 1/10; not much change about the same surface area. This has a yellowish surface to it very gritty. We have been using Santyl and Hydrofera Blue for a prolonged period. Culture I did last week showed methicillin sensitive staph aureus "rare". Our intake nurse reports greenish drainage which may be the Hydrofera Blue itself 1/17; wounds are continue to measure smaller although I am not sure about the accuracy here. Especially the areas on the right are covered in what looks to be a nonviable surface although she does have some epithelialization. Similarly she has areas on the left medial and left lateral ankle area which appear to have a better surface and perhaps are slightly smaller. We have been using Santyl and  Hydrofera Blue. She cannot tolerate mechanical debridements She went for her reflux studies which showed significant reflux  at the greater saphenous vein at the saphenofemoral junction as well as the greater saphenous vein in the proximal calf on the left she had reflux in the thigh and the common femoral vein and supra vein Fishel vein reflux in the greater saphenous vein. I will have vein and vascular look at this. My thoughts have been that these are likely [name] wounds. I looked through her old records from [facility] wound care center and then when she graduated to [facility] wound care center where she saw Dr. Zigmund Daniel and Dr. Vernona Rieger. Although I can see she had reflux studies done I do not see that she actually saw a vein and vascular. I went over the fact that she had operative debridements and actual skin grafting that did not take. I do not think these wounds have ever really progressed towards healing 1/31;Substantial wounds on the right ankle area. Hyper granulated very gritty adherent debris on the surface. 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 On the left she has no DVT or SVT deep vein reflux in the common femoral vein. Superficial vein reflux in the greater saphenous vein on although the vein diameter is not really all that large. I do not think there is anything that can be done with these although I am going to send her for consultation to vein and vascular. 2/7; Wound exam; substantial wound area on the right posterior ankle area and areas on the left medial ankle and left lateral ankle. I was able to debride the  left medial ankle last week fairly aggressively and it is back this week to a completely nonviable surface She will see vascular surgery this Friday and I would like them to review the venous studies and also any comments on her arterial status. If they do not see an issue here I am going to refer her to plastic surgery for an operative debridement perhaps intraoperative ACell or Integra. Eventually she will require a deep tissue culture again 2/14; substantial wound area on the right posterior ankle, medial ankle. We have been using silver alginate The patient was seen by vein and vascular she had both venous reflux studies and arterial studies. 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 She had no evidence of superficial venous thrombosis. She did have superficial vein reflux at the saphenofemoral junction and the greater saphenous vein. On the left no evidence of a DVT no evidence of superficial venous throat thrombosis she did have deep vein reflux in the common femoral vein and superficial vein reflux in the greater saphenous vein but these were not felt to be amenable to ablation. In terms of arterial studies she had triphasic and wife biphasic wave waveforms bilaterally not felt to have a significant arterial issue. I do not get the feeling that they felt that any part of her nonhealing wounds were related to either arterial or venous issues. They did note that she had venous reflux at the right at the [facility] and [facility]. And also on the left there were reflux in  the deep system at the common femoral vein and greater saphenous vein in the proximal thigh. Nothing amenable to ablation. 2/20; she is making some decent progress on the right where there is nice skin between the 2 open areas on the right ankle. The surfaces here do not look viable yet there is some surrounding epithelialization. She still has a small area on the  left medial ankle area. Hyper-granulated Jody's away always Electronic Signature(s) Signed: 07/09/2021 2:53:35 PM By: [name] Ham MD Entered By: [name] on 07/09/2021 10:31:44 -------------------------------------------------------------------------------- Chemical Cauterization Details Patient Name: Date of Service: [name] 07/09/2021 10:00 A M Medical Record Number: [id number] Patient Account Number: [id number] Date of Birth/Sex: Treating [name]: 1971/02/15 (51 y.o. F) Primary Care [name]: [name] Other Clinician: Referring [name]: Treating [name]/Extender: [name] in Treatment: 11 Procedure Performed for: Wound #19 Left,Medial Ankle Performed By: Physician Ricard Dillon., MD Post Procedure Diagnosis Same as Pre-procedure Notes using silver nitrate stick Electronic Signature(s) Signed: 07/09/2021 2:53:35 PM By: [name] Ham MD Entered By: [name] on 07/09/2021 10:29:09 -------------------------------------------------------------------------------- Physical Exam Details Patient Name: Date of Service: [name] 07/09/2021 10:00 A M Medical Record Number: [id number] Patient Account Number: [id number] Date of Birth/Sex: Treating [name]: 1971-04-20 (51 y.o. F) Primary Care [name]: [name] Other Clinician: Referring [name]: Treating [name]/Extender: [name] [name] in Treatment: 11 Notes exam; right lower leg. Now 2 areas separated by a fairly large area of normal skin posteriorly. The surface of this looks nonviable yet she seems to be epithelializing and some area. Measurements are slightly better no debridement was required and she T olerated in any case Small area on the left medial ankle slightly hypertrophic granulated I used silver nitrate on this Electronic Signature(s) Signed: 07/09/2021 2:53:35 PM By: [name] Ham MD Entered By: [name] on  07/09/2021 10:32:59 -------------------------------------------------------------------------------- Physician Orders Details Patient Name: Date of Service: [name] 07/09/2021 10:00 [place] Number: [id number] Patient Account Number: [id number] Date of Birth/Sex: Treating [name]: 08-13-1970 (51 y.o. [name] Other Clinician: Referring [name]: Treating [name]/Extender: [name] in Treatment: 11 Verbal / Phone Orders: No Diagnosis Coding Follow-up Appointments ppointment in 1 week. - Dr. Dellia Nims. Return A Other: - ****Referral to Plastic Surgeon (Dr Claudia Desanctis)***** Bathing/ Shower/ Hygiene May shower with protection but do not get wound dressing(s) wet. - Can get cast protector bags at [facility] or [facility] Edema Control - Lymphedema / SCD / Other Elevate legs to the level of the heart or above for 30 minutes daily and/or when sitting, a frequency of: - throughout the day Avoid standing for long periods of time. Exercise regularly Additional Orders / Instructions Follow Nutritious Diet Wound Treatment Wound #17 - Lower Leg Wound Laterality: Right, Lateral Cleanser: Soap and Water 2 x Per Week/30 Days Discharge Instructions: May shower and wash wound with dial antibacterial soap and water prior to dressing change. Cleanser: Wound Cleanser 2 x Per Week/30 Days Discharge Instructions: Cleanse the wound with wound cleanser prior to applying a clean dressing using gauze sponges, not tissue or cotton balls. Peri-Wound Care: Triamcinolone 15 (g) 2 x Per Week/30 Days Discharge Instructions: Use triamcinolone 15 (g) as directed Peri-Wound Care: Sween Lotion (Moisturizing lotion) 2 x Per Week/30 Days Discharge Instructions: Apply moisturizing lotion as directed Prim Dressing: KerraCel Ag Gelling Fiber Dressing, 4x5 in (silver alginate) 2 x Per Week/30 Days ary Discharge Instructions: Apply  silver  alginate to wound bed as instructed Prim Dressing: Santyl Ointment 2 x Per Week/30 Days ary Discharge Instructions: Apply nickel thick amount to wound bed as instructed Secondary Dressing: ABD Pad, 5x9 2 x Per Week/30 Days Discharge Instructions: Apply over primary dressing as directed. Secondary Dressing: Zetuvit Plus 4x8 in 2 x Per Week/30 Days Discharge Instructions: Apply over primary dressing as directed. Compression Wrap: Kerlix Roll 4.5x3.1 (in/yd) 2 x Per Week/30 Days Discharge Instructions: Apply Kerlix and Coban compression as directed. Compression Wrap: Coban Self-Adherent Wrap 4x5 (in/yd) 2 x Per Week/30 Days Discharge Instructions: Apply over Kerlix as directed. Wound #18 - Ankle Wound Laterality: Left, Posterior Cleanser: Soap and Water 2 x Per Week/30 Days Discharge Instructions: May shower and wash wound with dial antibacterial soap and water prior to dressing change. Cleanser: Wound Cleanser 2 x Per Week/30 Days Discharge Instructions: Cleanse the wound with wound cleanser prior to applying a clean dressing using gauze sponges, not tissue or cotton balls. Peri-Wound Care: Triamcinolone 15 (g) 2 x Per Week/30 Days Discharge Instructions: Use triamcinolone 15 (g) as directed Peri-Wound Care: Sween Lotion (Moisturizing lotion) 2 x Per Week/30 Days Discharge Instructions: Apply moisturizing lotion as directed Prim Dressing: KerraCel Ag Gelling Fiber Dressing, 4x5 in (silver alginate) 2 x Per Week/30 Days ary Discharge Instructions: Apply silver alginate to wound bed as instructed Secondary Dressing: Woven Gauze Sponge, Non-Sterile 4x4 in 2 x Per Week/30 Days Discharge Instructions: Apply over primary dressing as directed. Compression Wrap: Kerlix Roll 4.5x3.1 (in/yd) 2 x Per Week/30 Days Discharge Instructions: Apply Kerlix and Coban compression as directed. Compression Wrap: Coban Self-Adherent Wrap 4x5 (in/yd) 2 x Per Week/30 Days Discharge Instructions: Apply over Kerlix  as directed. Wound #19 - Ankle Wound Laterality: Left, Medial Cleanser: Soap and Water 2 x Per Week/30 Days Discharge Instructions: May shower and wash wound with dial antibacterial soap and water prior to dressing change. Cleanser: Wound Cleanser 2 x Per Week/30 Days Discharge Instructions: Cleanse the wound with wound cleanser prior to applying a clean dressing using gauze sponges, not tissue or cotton balls. Peri-Wound Care: Triamcinolone 15 (g) 2 x Per Week/30 Days Discharge Instructions: Use triamcinolone 15 (g) as directed Peri-Wound Care: Sween Lotion (Moisturizing lotion) 2 x Per Week/30 Days Discharge Instructions: Apply moisturizing lotion as directed Prim Dressing: KerraCel Ag Gelling Fiber Dressing, 4x5 in (silver alginate) 2 x Per Week/30 Days ary Discharge Instructions: Apply silver alginate to wound bed as instructed Prim Dressing: Santyl Ointment 2 x Per Week/30 Days ary Discharge Instructions: Apply nickel thick amount to wound bed as instructed Secondary Dressing: ABD Pad, 5x9 2 x Per Week/30 Days Discharge Instructions: Apply over primary dressing as directed. Compression Wrap: Kerlix Roll 4.5x3.1 (in/yd) 2 x Per Week/30 Days Discharge Instructions: Apply Kerlix and Coban compression as directed. Compression Wrap: Coban Self-Adherent Wrap 4x5 (in/yd) 2 x Per Week/30 Days Discharge Instructions: Apply over Kerlix as directed. Wound #21 - Ankle Wound Laterality: Right, Medial Cleanser: Soap and Water 2 x Per Week/30 Days Discharge Instructions: May shower and wash wound with dial antibacterial soap and water prior to dressing change. Cleanser: Wound Cleanser 2 x Per Week/30 Days Discharge Instructions: Cleanse the wound with wound cleanser prior to applying a clean dressing using gauze sponges, not tissue or cotton balls. Peri-Wound Care: Triamcinolone 15 (g) 2 x Per Week/30 Days Discharge Instructions: Use triamcinolone 15 (g) as directed Peri-Wound Care: Sween Lotion  (Moisturizing lotion) 2 x Per Week/30 Days Discharge Instructions: Apply moisturizing lotion as directed Prim  Dressing: KerraCel Ag Gelling Fiber Dressing, 4x5 in (silver alginate) 2 x Per Week/30 Days ary Discharge Instructions: Apply silver alginate to wound bed as instructed Secondary Dressing: ABD Pad, 5x9 2 x Per Week/30 Days Discharge Instructions: Apply over primary dressing as directed. Secondary Dressing: Zetuvit Plus 4x8 in 2 x Per Week/30 Days Discharge Instructions: Apply over primary dressing as directed. Compression Wrap: Kerlix Roll 4.5x3.1 (in/yd) 2 x Per Week/30 Days Discharge Instructions: Apply Kerlix and Coban compression as directed. Compression Wrap: Coban Self-Adherent Wrap 4x5 (in/yd) 2 x Per Week/30 Days Discharge Instructions: Apply over Kerlix as directed. Electronic Signature(s) Signed: 07/09/2021 2:53:35 PM By: [name] Ham MD Signed: 07/09/2021 5:43:45 PM By: [name] Gouty [name], BSN Entered By: [name] on 07/09/2021 10:27:13 -------------------------------------------------------------------------------- Problem List Details Patient Name: Date of Service: [name] 07/09/2021 10:00 A M Medical Record Number: [id number] Patient Account Number: [id number] Date of Birth/Sex: Treating [name]: 1971-02-05 (51 y.o. 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 F) Primary Care [name]: [name] Other Clinician: Referring [name]: Treating [name]/Extender: [name] [name] in Treatment: 11 Subjective History of Present Illness (HPI) The following HPI elements were documented for the patient's wound: Location: medial and lateral ankle region on the right and left medial malleolus Quality: Patient reports experiencing a shooting pain to affected area(s). Severity: Patient states wound(s) are getting worse. Duration: right lower extremity bimalleolar ulcers have been present for approximately 2 years; the rright meedial malleolus ulcer has been there proximally 6 months Timing: Pain in wound is constant (hurts all the time) Context: The wound would happen gradually Associated Signs and Symptoms: Patient reports having increase discharge. 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 Leland Johns for a surgical debridement as I was unable to do anything in the office due to excruciating pain. At that stage she was referred from the plastic surgery service to dermatology who treated her for a skin infection with doxycycline and then Levaquin and a local antibiotic ointment. I understand the patient has since developed ulceration on the left ankle both medial and lateral and was now referred back to the wound center as dermatology has finished the management. I do not have any notes from the dermatology department Old notes: 51 year old patient with a  history of [name] cell anemia, pain bilateral lower extremities, right lower extremity ulcer and has a history of receiving a skin graft( Theraskin) several months ago. She has been visiting the wound center [facility] and was seen by Dr. Dellia Nims and Dr. Leland Johns. after prolonged conservator therapy between July 2016 and January 2017. She had been seen by the plastic surgeon and taken to the  OR for debridement and application of Theraskin. She had 3 applications of Theraskin and was then treated with collagen. Prior to that she had a history of similar problems in 2014 and was treated conservatively. Had a reflux study done for the right lower extremity in August 2016 without reflux or DVT . Past medical history significant for [name] cell disease, anemia, leg ulcers, cholelithiasis,and has never been a smoker. Once the patient was discharged on the wound center she says within 2 or 3 [name] the problems recurred and she has been treating it conservatively. since I saw her 3 [name] ago at [facility] she has been unable to get her dressing material but has completed a course of doxycycline. 6/7/ 2017 -- lower extremity venous duplex reflux evaluation was done oo No evidence of SVT or DVT in the RLL. No venous incompetence in the RLL. No further vascular workup is indicated at this time. She was seen by Dr. [name], on 10/04/2015. She agreed with the plan of taking her to the OR for debridement and application of theraskin and would also take biopsies to rule out pyoderma gangrenosum. Follow-up note dated May 31 received and she was status post application of Theraskin to multiple ulcers around the right ankle. Pathology did not show evidence of malignancy or pyoderma gangrenosum. She would continue to see as in the wound clinic for further care and see Dr. Leland Johns as needed. The patient brought the biopsy report and it was consistent with stasis ulcer no evidence of malignancy and the comment  was that there was some adjacent neovascularization, fibrosis and patchy perivascular chronic inflammation. 11/15/2015 -- today we applied her first application of Theraskin 11/30/15; TheraSkin #2 12/13/2015 -- she is having a lot of pain locally and is here for possible application of a theraskin today. 01/16/2016 -- the patient has significant pain and has noticed despite in spite of all local care and oral pain medication. It is impossible to debride her in the office. 02/06/2016 -- I do not see any notes from Dr. Iran Planas( the patient has not made a call to the office know as she heard from them) and the only visit to recently was with her PCP Dr. Danella Penton -- I saw her on 01/16/2016 and prescribed 90 tablets of oxycodone 10 mg and did lab work and screening for HIV. the HIV was negative and hemoglobin was 6.3 with a WBC count of 14.9 and hematocrit of 17.8 with platelets of 561. reticulocyte count was 15.5% READMISSION: 07/10/2016- The patient is here for readmission for bilateral lower extremity ulcers in the presence of [name] cell. The bimalleolar ulcers to the right lower extremity have been present for approximately 2 years, the left medial malleolus ulcer has been present approximately 6 months. She has followed with Dr.Thimmappa in the past and has had a total of 3 applications of Theraskin (01/2015, 09/2015, 06/17/16). She has also followed with Dr. Con Memos here in the clinic and has received 2 applications of TheraSkin (11/10/15, 11/30/15). The patient does experience chronic, and is not amenable to debridement. She had a [name] cell crisis in December 2017, prior to that has been several years. She is not currently on any antibiotic therapy and has not been treated with any recently. 07/17/2016 -- was seen by Dr. Iran Planas of plastic surgery who saw her 2 [name] postop application of Theraskin #3. She had removed her dressing and asked her to apply silver alginate on alternate days and  follow-up back with the wound center.  Future debridements and application of skin substitute would have to be done in the hospital due to her high risk for anesthesia. READMISSION 04/17/2021 Patient is now a 51 year old woman that we have had in this clinic for a prolonged period of time and 2016-2017 and then again for 2 visits in February 2018. At that point she had wounds on the right lower leg predominantly medial. She had also been seen by plastic surgery Dr. Leland Johns who I believe took her to the OR for operative debridement and application of TheraSkin in 2017. After she left our clinic she was followed for a very prolonged period of time in the wound care center in [facility] who then referred her ultimately to [facility] where she was seen by Dr. Vernona Rieger. Again taken her to the OR for skin grafting which apparently did not take. She had multiple other attempts at dressings although I have not really looked over all of these notes in great detail. She has not been seen in a wound care center in about a year. She states over the last year in addition to her right lower leg she has developed wounds on the left lower leg quite extensive. She is using Xeroform to all of these wounds without really any improvement. She also has Medicaid which does not cover wound products. The patient has had vascular work-ups in the past including most recently on 03/28/2021 showing biphasic waveforms on the right triphasic at the PTA and biphasic at the dorsalis pedis on the left. She was unable to tolerate any degree of compression to do ABIs. Unfortunately TBI's were also not done. She had venous reflux studies done in 2017. This did not show any evidence of a DVT or SVT and no venous incompetence was noted in the right leg at the time this was the only side with the wound As noted I did not look all over her old records. She apparently had a course of HBO and [facility] although I am not sure what the indication would  have been. In any case she developed seizures and terminated treatment earlier. She is generally much more disabled than when we last saw her in clinic. She can no longer walk pretty much wheelchair-bound because predominantly of pain in the left hip. 04/24/2021; the patient tolerated the wraps we put on. We used Santyl and Hydrofera Blue under compression. I brought her back for a nurse visit for a change in dressing. With Medicaid we will have a hard time getting anything paid for and hence the need for compression. She arrives in clinic with all the wounds looking somewhat better in terms of surface 12/20; circumferential wound on the right from the lateral to the medial. She has open areas on the left medial and left lateral x2 on all of this with the same surface. This does not look completely healthy although she does have some epithelialization. She is not complaining of a lot of pain which is unusual for her [name] ulcers. I have not looked over her extensive records from [facility]. She had recent arterial studies and has a history of venous reflux studies I will need to look these over although I do not believe she has significant arterial disease 2023 05/22/2021; patient's wound areas measure slightly smaller. Still a lot of drainage coming from the right we have been using Hydrofera Blue and Santyl with some improvement in the wound surfaces. She tells me she will be getting transfused later in the week for her underlying  [name] cell anemia I have looked over her recent arterial studies which were done in the fall. This was in November and showed biphasic and triphasic waveforms but she could not tolerate ABIs because of pressure and unfortunately TBI's were not done. She has not had recent venous reflux studies that I can see 1/10; not much change about the same surface area. This has a yellowish surface to it very gritty. We have been using Santyl and Hydrofera Blue for a prolonged period.  Culture I did last week showed methicillin sensitive staph aureus "rare". Our intake nurse reports greenish drainage which may be the Hydrofera Blue itself 1/17; wounds are continue to measure smaller although I am not sure about the accuracy here. Especially the areas on the right are covered in what looks to be a nonviable surface although she does have some epithelialization. Similarly she has areas on the left medial and left lateral ankle area which appear to have a better surface and perhaps are slightly smaller. We have been using Santyl and Hydrofera Blue. She cannot tolerate mechanical debridements She went for her reflux studies which showed significant reflux at the greater saphenous vein at the saphenofemoral junction as well as the greater saphenous vein in the proximal calf on the left she had reflux in the thigh and the common femoral vein and supra vein Fishel vein reflux in the greater saphenous vein. I will have vein and vascular look at this. My thoughts have been that these are likely [name] wounds. I looked through her old records from [facility] wound care center and then when she graduated to [facility] wound care center where she saw Dr. Zigmund Daniel and Dr. Vernona Rieger. Although I can see she had reflux studies done I do not see that she actually saw a vein and vascular. I went over the fact that she had operative debridements and actual skin grafting that did not take. I do not think these wounds have ever really progressed towards healing 1/31;Substantial wounds on the right ankle area. Hyper granulated very gritty adherent debris on the surface. 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 On the left she has no DVT or SVT deep vein reflux in the common femoral vein. Superficial vein reflux in the greater saphenous vein on although the vein diameter is not really all that large. I do not think there is anything that can be done with these although I am going to send her for consultation to vein and vascular. 2/7; Wound exam; substantial wound area on the right posterior ankle area and areas on the left medial ankle and left lateral ankle. I was able to debride the left medial ankle last week fairly aggressively and it is back this week to a completely nonviable surface She will see vascular surgery this Friday and I would like them to review the venous studies and also any comments on her arterial status. If they do not see an issue here I am going to refer her to plastic surgery for an operative debridement perhaps intraoperative ACell or Integra. Eventually she will require a deep tissue culture again 2/14;  substantial wound area on the right posterior ankle, medial ankle. We have been using silver alginate The patient was seen by vein and vascular she had both venous reflux studies and arterial studies. 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 She had no evidence of superficial venous thrombosis. She did have superficial vein reflux at the saphenofemoral junction and the greater saphenous vein. On the left no evidence of a DVT no evidence of superficial venous throat thrombosis she did have deep vein reflux in the common femoral vein and superficial vein reflux in the greater saphenous vein but these were not felt to be amenable to ablation. In terms of arterial studies she had triphasic and wife biphasic wave waveforms bilaterally not felt to have a significant arterial issue. I do not get the feeling that they felt that any part of  her nonhealing wounds were related to either arterial or venous issues. They did note that she had venous reflux at the right at the [facility] and [facility]. And also on the left there were reflux in the deep system at the common femoral vein and greater saphenous vein in the proximal thigh. Nothing amenable to ablation. 2/20; she is making some decent progress on the right where there is nice skin between the 2 open areas on the right ankle. The surfaces here do not look viable yet there is some surrounding epithelialization. She still has a small area on the left medial ankle area. Hyper-granulated Jody's away always Objective Constitutional Vitals Time Taken: 9:50 AM, Height: 67 in, Temperature: 98.1 F, Pulse: 90 bpm, Respiratory Rate: 16 breaths/min, Blood Pressure: 128/83 mmHg. Integumentary (Hair, Skin) Wound #17 status is Open. Original cause of wound was Gradually Appeared. The date acquired was: 10/05/2012. The wound has been in treatment 11 [name]. The wound is located on the Right,Lateral Lower Leg. The wound measures 9.2cm length x 5cm width x 0.1cm depth; 36.128cm^2 area and 3.613cm^3 volume. There is Fat Layer (Subcutaneous Tissue) exposed. There is a large amount of purulent drainage noted. There is small (1-33%) pink granulation within the wound bed. There is a large (67-100%) amount of necrotic tissue within the wound bed including Adherent Slough. Wound #18 status is Open. Original cause of wound was Gradually Appeared. The date acquired was: 12/25/2020. The wound has been in treatment 11 [name]. The wound is located on the Left,Posterior Ankle. The wound measures 0.9cm length x 0.3cm width x 0.1cm depth; 0.212cm^2 area and 0.021cm^3 volume. There is Fat Layer (Subcutaneous Tissue) exposed. There is no tunneling or undermining noted. There is a medium amount of serosanguineous drainage noted. The wound margin is flat and intact. There is large (67-100%) red granulation within the wound bed. There  is a small (1-33%) amount of necrotic tissue within the wound bed including Adherent Slough. Wound #19 status is Open. Original cause of wound was Gradually Appeared. The date acquired was: 12/25/2020. The wound has been in treatment 11 [name]. The wound is located on the Left,Medial Ankle. The wound measures 2cm length x 1.9cm width x 0.1cm depth; 2.985cm^2 area and 0.298cm^3 volume. There is Fat Layer (Subcutaneous Tissue) exposed. There is no tunneling or undermining noted. There is a medium amount of serosanguineous drainage noted. The wound margin is flat and intact. There is medium (34-66%) red, hyper - granulation within the wound bed. There is a medium (34-66%) amount of necrotic tissue within the wound bed including Adherent  Slough. Wound #21 status is Open. Original cause of wound was Gradually Appeared. The date acquired was: 06/26/2021. The wound has been in treatment 1 [name]. The wound is located on the Right,Medial Ankle. The wound measures 7.3cm length x 3.8cm width x 0.1cm depth; 21.787cm^2 area and 2.179cm^3 volume. There is Fat Layer (Subcutaneous Tissue) exposed. There is a large amount of purulent drainage noted. There is small (1-33%) red granulation within the wound bed. There is a large (67-100%) amount of necrotic tissue within the wound bed including Adherent Slough. 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 An Chemical Cauterization procedure was performed by Ricard Dillon., MD. Post procedure Diagnosis Wound #19: Same as Pre-Procedure Notes: using silver nitrate stick Plan Follow-up Appointments: Return Appointment in 1 week. - Dr. Dellia Nims. Other: -  ****Referral to Plastic Surgeon (Dr Claudia Desanctis)***** Bathing/ Shower/ Hygiene: May shower with protection but do not get wound dressing(s) wet. - Can get cast protector bags at [facility] or [facility] Edema Control - Lymphedema / SCD / Other: Elevate legs to the level of the heart or above for 30 minutes daily and/or when sitting, a frequency of: - throughout the day Avoid standing for long periods of time. Exercise regularly Additional Orders / Instructions: Follow Nutritious Diet WOUND #17: - Lower Leg Wound Laterality: Right, Lateral Cleanser: Soap and Water 2 x Per Week/30 Days Discharge Instructions: May shower and wash wound with dial antibacterial soap and water prior to dressing change. Cleanser: Wound Cleanser 2 x Per Week/30 Days Discharge Instructions: Cleanse the wound with wound cleanser prior to applying a clean dressing using gauze sponges, not tissue or cotton balls. Peri-Wound Care: Triamcinolone 15 (g) 2 x Per Week/30 Days Discharge Instructions: Use triamcinolone 15 (g) as directed Peri-Wound Care: Sween Lotion (Moisturizing lotion) 2 x Per Week/30 Days Discharge Instructions: Apply moisturizing lotion as directed Prim Dressing: KerraCel Ag Gelling Fiber Dressing, 4x5 in (silver alginate) 2 x Per Week/30 Days ary Discharge Instructions: Apply silver alginate to wound bed as instructed Prim Dressing: Santyl Ointment 2 x Per Week/30 Days ary Discharge Instructions: Apply nickel thick amount to wound bed as instructed Secondary Dressing: ABD Pad, 5x9 2 x Per Week/30 Days Discharge Instructions: Apply over primary dressing as directed. Secondary Dressing: Zetuvit Plus 4x8 in 2 x Per Week/30 Days Discharge Instructions: Apply over primary dressing as directed. Com pression Wrap: Kerlix Roll 4.5x3.1 (in/yd) 2 x Per Week/30 Days Discharge Instructions: Apply Kerlix and Coban compression as directed. Com pression Wrap: Coban Self-Adherent Wrap 4x5 (in/yd) 2 x Per Week/30 Days Discharge  Instructions: Apply over Kerlix as directed. WOUND #18: - Ankle Wound Laterality: Left, Posterior Cleanser: Soap and Water 2 x Per Week/30 Days Discharge Instructions: May shower and wash wound with dial antibacterial soap and water prior to dressing change. Cleanser: Wound Cleanser 2 x Per Week/30 Days Discharge Instructions: Cleanse the wound with wound cleanser prior to applying a clean dressing using gauze sponges, not tissue or cotton balls. Peri-Wound Care: Triamcinolone 15 (g) 2 x Per Week/30 Days Discharge Instructions: Use triamcinolone 15 (g) as directed Peri-Wound Care: Sween Lotion (Moisturizing lotion) 2 x Per Week/30 Days Discharge Instructions: Apply moisturizing lotion as directed Prim Dressing: KerraCel Ag Gelling Fiber Dressing, 4x5 in (  silver alginate) 2 x Per Week/30 Days ary Discharge Instructions: Apply silver alginate to wound bed as instructed Secondary Dressing: Woven Gauze Sponge, Non-Sterile 4x4 in 2 x Per Week/30 Days Discharge Instructions: Apply over primary dressing as directed. Com pression Wrap: Kerlix Roll 4.5x3.1 (in/yd) 2 x Per Week/30 Days Discharge Instructions: Apply Kerlix and Coban compression as directed. Com pression Wrap: Coban Self-Adherent Wrap 4x5 (in/yd) 2 x Per Week/30 Days Discharge Instructions: Apply over Kerlix as directed. WOUND #19: - Ankle Wound Laterality: Left, Medial Cleanser: Soap and Water 2 x Per Week/30 Days Discharge Instructions: May shower and wash wound with dial antibacterial soap and water prior to dressing change. Cleanser: Wound Cleanser 2 x Per Week/30 Days Discharge Instructions: Cleanse the wound with wound cleanser prior to applying a clean dressing using gauze sponges, not tissue or cotton balls. Peri-Wound Care: Triamcinolone 15 (g) 2 x Per Week/30 Days Discharge Instructions: Use triamcinolone 15 (g) as directed Peri-Wound Care: Sween Lotion (Moisturizing lotion) 2 x Per Week/30 Days Discharge Instructions: Apply  moisturizing lotion as directed Prim Dressing: KerraCel Ag Gelling Fiber Dressing, 4x5 in (silver alginate) 2 x Per Week/30 Days ary Discharge Instructions: Apply silver alginate to wound bed as instructed Prim Dressing: Santyl Ointment 2 x Per Week/30 Days ary Discharge Instructions: Apply nickel thick amount to wound bed as instructed Secondary Dressing: ABD Pad, 5x9 2 x Per Week/30 Days Discharge Instructions: Apply over primary dressing as directed. Com pression Wrap: Kerlix Roll 4.5x3.1 (in/yd) 2 x Per Week/30 Days Discharge Instructions: Apply Kerlix and Coban compression as directed. Com pression Wrap: Coban Self-Adherent Wrap 4x5 (in/yd) 2 x Per Week/30 Days Discharge Instructions: Apply over Kerlix as directed. WOUND #21: - Ankle Wound Laterality: Right, Medial Cleanser: Soap and Water 2 x Per Week/30 Days Discharge Instructions: May shower and wash wound with dial antibacterial soap and water prior to dressing change. Cleanser: Wound Cleanser 2 x Per Week/30 Days Discharge Instructions: Cleanse the wound with wound cleanser prior to applying a clean dressing using gauze sponges, not tissue or cotton balls. Peri-Wound Care: Triamcinolone 15 (g) 2 x Per Week/30 Days Discharge Instructions: Use triamcinolone 15 (g) as directed Peri-Wound Care: Sween Lotion (Moisturizing lotion) 2 x Per Week/30 Days Discharge Instructions: Apply moisturizing lotion as directed Prim Dressing: KerraCel Ag Gelling Fiber Dressing, 4x5 in (silver alginate) 2 x Per Week/30 Days ary Discharge Instructions: Apply silver alginate to wound bed as instructed Secondary Dressing: ABD Pad, 5x9 2 x Per Week/30 Days Discharge Instructions: Apply over primary dressing as directed. Secondary Dressing: Zetuvit Plus 4x8 in 2 x Per Week/30 Days Discharge Instructions: Apply over primary dressing as directed. Com pression Wrap: Kerlix Roll 4.5x3.1 (in/yd) 2 x Per Week/30 Days Discharge Instructions: Apply Kerlix and  Coban compression as directed. Com pression Wrap: Coban Self-Adherent Wrap 4x5 (in/yd) 2 x Per Week/30 Days Discharge Instructions: Apply over Kerlix as directed. #1 I continued with the sagittal with silver alginate under compression. We've appear to be making some progress #2 she cannot tolerate any form of mechanical debridement and I'm not going to retry this. 3 3 still was not heard from plastic surgery. #4 the measurements appears somewhat better and in spite of the surfaces of the wound especially on the right it looks like there is been some epithelialization. Grounds for cautious optimism Engineer, maintenance) Signed: 07/09/2021 2:53:35 PM By: [name] Ham MD Entered By: [name] on 07/09/2021 10:35:05 -------------------------------------------------------------------------------- SuperBill Details Patient Name: Date of Service: [name] 07/09/2021 Medical  Record Number: [id number] Patient Account Number: [id number] Date of Birth/Sex: Treating [name]: May 31, 1970 (51 y.o. 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 [name]-cell disease without crisis Facility Procedures CPT4 Code: [id number] Description: 70962 - CHEM CAUT GRANULATION TISS ICD-10 Diagnosis Description L97.828 Non-pressure chronic ulcer of other part of left lower leg with other specified s Modifier: everity Quantity: 1 CPT4 Code: [id number] Description: 65465 - DEBRIDE W/O ANES NON SELECT Modifier: 76 Quantity: 1 Physician Procedures : CPT4 Code Description Modifier [id number] - WC PHYS CHEM CAUT GRAN TISSUE ICD-10  Diagnosis Description L97.828 Non-pressure chronic ulcer of other part of left lower leg with other specified severity Quantity: 1 Electronic Signature(s) Signed: 07/09/2021 2:53:35 PM By: [name] Ham MD Signed: 07/09/2021 5:43:45 PM By: [name] Gouty [name], BSN Entered By: [name] on 07/09/2021 11:01:55

## 2021-07-09 NOTE — Progress Notes (Signed)
YURIKO, PORTALES ([id number]) Visit Report for 07/09/2021 Arrival Information Details Patient Name: Date of Service: [name] 07/09/2021 10:00 A M Medical Record Number: [id number] Patient Account Number: [id number] Date of Birth/Sex: Treating RN: 14-Oct-1970 (51 y.o. [name] Primary Care Ellianna Ruest: [name] Other Clinician: Referring Alesa Echevarria: Treating Gualberto Wahlen/Extender: Bernette Redbird in Treatment: 11 Visit Information History Since Last Visit Added or deleted any medications: No Patient Arrived: Wheel Chair Any new allergies or adverse reactions: No Arrival Time: 09:45 Had a fall or experienced change in No Accompanied By: self activities of daily living that may affect Transfer Assistance: None risk of falls: Patient Identification Verified: Yes Signs or symptoms of abuse/neglect since last visito No Patient Requires Transmission-Based Precautions: No Hospitalized since last visit: No Patient Has Alerts: No Implantable device outside of the clinic excluding No cellular tissue based products placed in the center since last visit: Has Dressing in Place as Prescribed: Yes Has Compression in Place as Prescribed: Yes Pain Present Now: Yes Electronic Signature(s) Signed: 07/09/2021 6:05:09 PM By: Dellie Catholic RN Entered By: Dellie Catholic on 07/09/2021 09:50:37 -------------------------------------------------------------------------------- Encounter Discharge Information Details Patient Name: Date of Service: [name], [name] 07/09/2021 10:00 A M Medical Record Number: [id number] Patient Account Number: [id number] Date of Birth/Sex: Treating RN: [birth year] (51 y.o. [name] Primary Care Mikaili Flippin: [name] Other Clinician: Referring Ziyan Hillmer: Treating Yicel Shannon/Extender: Bernette Redbird in Treatment: 11 Encounter Discharge Information Items Discharge Condition: Stable Ambulatory Status:  Wheelchair Discharge Destination: Home Transportation: Other Accompanied By: self Schedule Follow-up Appointment: Yes Clinical Summary of Care: Patient Declined Notes transportation service Electronic Signature(s) Signed: 07/09/2021 5:43:45 PM By: Baruch Gouty RN, BSN Entered By: Baruch Gouty on 07/09/2021 10:52:47 -------------------------------------------------------------------------------- Lower Extremity Assessment Details Patient Name: Date of Service: [name], [name] 07/09/2021 10:00 A M Medical Record Number: [id number] Patient Account Number: [id number] Date of Birth/Sex: Treating RN: 02/04/1971 (51 y.o. [name] Primary Care Sarahann Horrell: [name] Other Clinician: Referring Neysha Criado: Treating Eliga Arvie/Extender: Farrel Demark Weeks in Treatment: 11 Edema Assessment Assessed: Shirlyn Goltz: No] [Right: No] Edema: [Left: No] [Right: No] Calf Left: Right: Point of Measurement: 33 cm From Medial Instep 26 cm 26.5 cm Ankle Left: Right: Point of Measurement: 10 cm From Medial Instep 20.2 cm 20.5 cm Electronic Signature(s) Signed: 07/09/2021 6:05:09 PM By: Dellie Catholic RN Entered By: Dellie Catholic on 07/09/2021 10:12:46 -------------------------------------------------------------------------------- Multi Wound Chart Details Patient Name: Date of Service: [name], [name] 07/09/2021 10:00 A M Medical Record Number: [id number] Patient Account Number: [id number] Date of Birth/Sex: Treating RN: 03/05/1971 (51 y.o. F) Primary Care Nastashia Gallo: [name] Other Clinician: Referring Collen Hostler: Treating Celsey Asselin/Extender: Farrel Demark Weeks in Treatment: 11 Vital Signs Height(in): 67 Pulse(bpm): 90 Weight(lbs): Blood Pressure(mmHg): 128/83 Body Mass Index(BMI): Temperature(F): 98.1 Respiratory Rate(breaths/min): 16 Photos: Right, Lateral Lower Leg Left, Posterior Ankle Left, Medial Ankle Wound  Location: Gradually Appeared Gradually Appeared Gradually Appeared Wounding Event: Sickle Cell Lesion Sickle Cell Lesion Sickle Cell Lesion Primary Etiology: N/A N/A N/A Secondary Etiology: Anemia, Sickle Cell Disease, Anemia, Sickle Cell Disease, Anemia, Sickle Cell Disease, Comorbid History: Neuropathy Neuropathy Neuropathy 10/05/2012 12/25/2020 12/25/2020 Date Acquired: 11 11 11  Weeks of Treatment: Open Open Open Wound Status: No No No Wound Recurrence: Yes No No Clustered Wound: 9.2x5x0.1 0.9x0.3x0.1 2x1.9x0.1 Measurements L x W x D (cm) 36.128 0.212 2.985 A (cm) : rea 3.613 0.021 0.298 Volume (cm) : 80.80% 98.50% 85.90% % Reduction in Area:  80.80% 99.50% 98.00% % Reduction in Volume: Full Thickness Without Exposed Full Thickness Without Exposed Full Thickness Without Exposed Classification: Support Structures Support Structures Support Structures Large Medium Medium Exudate Amount: Purulent Serosanguineous Serosanguineous Exudate Type: yellow, brown, green red, brown red, brown Exudate Color: N/A Flat and Intact Flat and Intact Wound Margin: Small (1-33%) Large (67-100%) Medium (34-66%) Granulation Amount: Pink Red Red, Hyper-granulation Granulation Quality: Large (67-100%) Small (1-33%) Medium (34-66%) Necrotic Amount: Fat Layer (Subcutaneous Tissue): Yes Fat Layer (Subcutaneous Tissue): Yes Fat Layer (Subcutaneous Tissue): Yes Exposed Structures: Fascia: No Fascia: No Fascia: No Tendon: No Tendon: No Tendon: No Muscle: No Muscle: No Muscle: No Joint: No Joint: No Joint: No Bone: No Bone: No Bone: No Small (1-33%) Small (1-33%) Small (1-33%) Epithelialization: N/A N/A Chemical Cauterization Procedures Performed: Wound Number: 21 N/A N/A Photos: N/A N/A Right, Medial Ankle N/A N/A Wound Location: Gradually Appeared N/A N/A Wounding Event: Sickle Cell Lesion N/A N/A Primary Etiology: Venous Leg Ulcer N/A N/A Secondary Etiology: Anemia,  Sickle Cell Disease, N/A N/A Comorbid History: Neuropathy 06/26/2021 N/A N/A Date Acquired: 1 N/A N/A Weeks of Treatment: Open N/A N/A Wound Status: No N/A N/A Wound Recurrence: No N/A N/A Clustered Wound: 7.3x3.8x0.1 N/A N/A Measurements L x W x D (cm) 21.787 N/A N/A A (cm) : rea 2.179 N/A N/A Volume (cm) : 24.80% N/A N/A % Reduction in Area: 24.80% N/A N/A % Reduction in Volume: Full Thickness Without Exposed N/A N/A Classification: Support Structures Large N/A N/A Exudate Amount: Purulent N/A N/A Exudate Type: yellow, brown, green N/A N/A Exudate Color: N/A N/A N/A Wound Margin: Small (1-33%) N/A N/A Granulation Amount: Red N/A N/A Granulation Quality: Large (67-100%) N/A N/A Necrotic Amount: Fat Layer (Subcutaneous Tissue): Yes N/A N/A Exposed Structures: Fascia: No Tendon: No Muscle: No Joint: No Bone: No Small (1-33%) N/A N/A Epithelialization: N/A N/A N/A Procedures Performed: Treatment Notes Electronic Signature(s) Signed: 07/09/2021 2:53:35 PM By: Linton Ham MD Entered By: Linton Ham on 07/09/2021 10:28:48 -------------------------------------------------------------------------------- Multi-Disciplinary Care Plan Details Patient Name: Date of Service: [name], [name] 07/09/2021 10:00 A M Medical Record Number: [id number] Patient Account Number: [id number] Date of Birth/Sex: Treating RN: 17-Dec-1970 (51 y.o. [name] Primary Care Tierra Thoma: [name] Other Clinician: Referring Marilouise Densmore: Treating Allisha Harter/Extender: Bernette Redbird in Treatment: 11 Multidisciplinary Care Plan reviewed with physician Active Inactive Venous Leg Ulcer Nursing Diagnoses: Actual venous Insuffiency (use after diagnosis is confirmed) Knowledge deficit related to disease process and management Goals: Patient will maintain optimal edema control Date Initiated: 06/26/2021 Target Resolution Date: 07/24/2021 Goal  Status: Active Interventions: Assess peripheral edema status every visit. Compression as ordered Treatment Activities: Therapeutic compression applied : 06/26/2021 Notes: Wound/Skin Impairment Nursing Diagnoses: Impaired tissue integrity Goals: Patient/caregiver will verbalize understanding of skin care regimen Date Initiated: 04/17/2021 Target Resolution Date: 07/24/2021 Goal Status: Active Ulcer/skin breakdown will have a volume reduction of 30% by week 4 Date Initiated: 04/17/2021 Date Inactivated: 05/29/2021 Target Resolution Date: 05/15/2021 Goal Status: Met Ulcer/skin breakdown will have a volume reduction of 50% by week 8 Date Initiated: 05/29/2021 Date Inactivated: 06/26/2021 Target Resolution Date: 06/26/2021 Goal Status: Unmet Unmet Reason: venous reflux Ulcer/skin breakdown will have a volume reduction of 80% by week 12 Date Initiated: 06/26/2021 Target Resolution Date: 07/24/2021 Goal Status: Active Interventions: Assess patient/caregiver ability to obtain necessary supplies Assess patient/caregiver ability to perform ulcer/skin care regimen upon admission and as needed Assess ulceration(s) every visit Provide education on ulcer and skin care Treatment Activities: Topical wound  management initiated : 04/17/2021 Notes: 06/08/21: Left leg wounds greater than 30% volume reduction, right leg acute infection. Electronic Signature(s) Signed: 07/09/2021 5:43:45 PM By: Baruch Gouty RN, BSN Entered By: Baruch Gouty on 07/09/2021 10:24:09 -------------------------------------------------------------------------------- Pain Assessment Details Patient Name: Date of Service: [name], [name] 07/09/2021 10:00 A M Medical Record Number: [id number] Patient Account Number: [id number] Date of Birth/Sex: Treating RN: [birth year] (51 y.o. [name] Primary Care Keela Rubert: [name] Other Clinician: Referring Tanita Palinkas: Treating Camari Quintanilla/Extender: Farrel Demark Weeks in Treatment: 11 Active Problems Location of Pain Severity and Description of Pain Patient Has Paino Yes Site Locations Pain Location: Pain in Ulcers With Dressing Change: No Duration of the Pain. Constant / Intermittento Constant Rate the pain. Current Pain Level: 5 Worst Pain Level: 10 Least Pain Level: 10 Tolerable Pain Level: 3 Character of Pain Describe the Pain: Burning Pain Management and Medication Current Pain Management: Medication: Yes Cold Application: No Rest: Yes Massage: No Activity: No T.E.N.S.: No Heat Application: No Leg drop or elevation: No Is the Current Pain Management Adequate: Adequate How does your wound impact your activities of daily livingo Sleep: No Bathing: No Appetite: No Relationship With Others: No Bladder Continence: No Emotions: No Bowel Continence: No Work: No Toileting: No Drive: No Dressing: No Hobbies: No Electronic Signature(s) Signed: 07/09/2021 6:05:09 PM By: Dellie Catholic RN Entered By: Dellie Catholic on 07/09/2021 09:52:08 -------------------------------------------------------------------------------- Patient/Caregiver Education Details Patient Name: Date of Service: [name], [name] 2/20/2023andnbsp10:00 A M Medical Record Number: [id number] Patient Account Number: [id number] Date of Birth/Gender: Treating RN: 1971-01-03 (51 y.o. [name] Primary Care Physician: [name] Other Clinician: Referring Physician: Treating Physician/Extender: Bernette Redbird in Treatment: 11 Education Assessment Education Provided To: Patient Education Topics Provided Venous: Methods: Explain/Verbal Responses: Reinforcements needed, State content correctly Wound/Skin Impairment: Methods: Explain/Verbal Responses: Reinforcements needed, State content correctly Electronic Signature(s) Signed: 07/09/2021 5:43:45 PM By: Baruch Gouty RN, BSN Entered By:  Baruch Gouty on 07/09/2021 10:24:53 -------------------------------------------------------------------------------- Wound Assessment Details Patient Name: Date of Service: [name], [name] 07/09/2021 10:00 A M Medical Record Number: [id number] Patient Account Number: [id number] Date of Birth/Sex: Treating RN: June 08, 1970 (51 y.o. [name] Primary Care Doneen Ollinger: [name] Other Clinician: Referring Sharlene Mccluskey: Treating Taiya Nutting/Extender: Farrel Demark Weeks in Treatment: 11 Wound Status Wound Number: 17 Primary Etiology: Sickle Cell Lesion Wound Location: Right, Lateral Lower Leg Wound Status: Open Wounding Event: Gradually Appeared Comorbid History: Anemia, Sickle Cell Disease, Neuropathy Date Acquired: 10/05/2012 Weeks Of Treatment: 11 Clustered Wound: Yes Photos Wound Measurements Length: (cm) 9.2 Width: (cm) 5 Depth: (cm) 0.1 Area: (cm) 36.128 Volume: (cm) 3.613 % Reduction in Area: 80.8% % Reduction in Volume: 80.8% Epithelialization: Small (1-33%) Wound Description Classification: Full Thickness Without Exposed Support Structures Exudate Amount: Large Exudate Type: Purulent Exudate Color: yellow, brown, green Foul Odor After Cleansing: No Slough/Fibrino Yes Wound Bed Granulation Amount: Small (1-33%) Exposed Structure Granulation Quality: Pink Fascia Exposed: No Necrotic Amount: Large (67-100%) Fat Layer (Subcutaneous Tissue) Exposed: Yes Necrotic Quality: Adherent Slough Tendon Exposed: No Muscle Exposed: No Joint Exposed: No Bone Exposed: No Treatment Notes Wound #17 (Lower Leg) Wound Laterality: Right, Lateral Cleanser Soap and Water Discharge Instruction: May shower and wash wound with dial antibacterial soap and water prior to dressing change. Wound Cleanser Discharge Instruction: Cleanse the wound with wound cleanser prior to applying a clean dressing using gauze sponges, not tissue or cotton balls. Peri-Wound  Care Triamcinolone 15 (g) Discharge  Instruction: Use triamcinolone 15 (g) as directed Sween Lotion (Moisturizing lotion) Discharge Instruction: Apply moisturizing lotion as directed Topical Primary Dressing KerraCel Ag Gelling Fiber Dressing, 4x5 in (silver alginate) Discharge Instruction: Apply silver alginate to wound bed as instructed Santyl Ointment Discharge Instruction: Apply nickel thick amount to wound bed as instructed Secondary Dressing ABD Pad, 5x9 Discharge Instruction: Apply over primary dressing as directed. Zetuvit Plus 4x8 in Discharge Instruction: Apply over primary dressing as directed. Secured With Compression Wrap Kerlix Roll 4.5x3.1 (in/yd) Discharge Instruction: Apply Kerlix and Coban compression as directed. Coban Self-Adherent Wrap 4x5 (in/yd) Discharge Instruction: Apply over Kerlix as directed. Compression Stockings Add-Ons Electronic Signature(s) Signed: 07/09/2021 6:05:09 PM By: Dellie Catholic RN Entered By: Dellie Catholic on 07/09/2021 10:17:27 -------------------------------------------------------------------------------- Wound Assessment Details Patient Name: Date of Service: [name], [place] [name] 07/09/2021 10:00 A M Medical Record Number: [id number] Patient Account Number: [id number] Date of Birth/Sex: Treating RN: 1970-07-12 (51 y.o. [name] Primary Care Quanah Majka: [name] Other Clinician: Referring Curry Dulski: Treating Milka Windholz/Extender: Farrel Demark Weeks in Treatment: 11 Wound Status Wound Number: 18 Primary Etiology: Sickle Cell Lesion Wound Location: Left, Posterior Ankle Wound Status: Open Wounding Event: Gradually Appeared Comorbid History: Anemia, Sickle Cell Disease, Neuropathy Date Acquired: 12/25/2020 Weeks Of Treatment: 11 Clustered Wound: No Photos Wound Measurements Length: (cm) 0.9 Width: (cm) 0.3 Depth: (cm) 0.1 Area: (cm) 0.212 Volume: (cm) 0.021 % Reduction in Area: 98.5% %  Reduction in Volume: 99.5% Epithelialization: Small (1-33%) Tunneling: No Undermining: No Wound Description Classification: Full Thickness Without Exposed Support Structures Wound Margin: Flat and Intact Exudate Amount: Medium Exudate Type: Serosanguineous Exudate Color: red, brown Foul Odor After Cleansing: No Slough/Fibrino Yes Wound Bed Granulation Amount: Large (67-100%) Exposed Structure Granulation Quality: Red Fascia Exposed: No Necrotic Amount: Small (1-33%) Fat Layer (Subcutaneous Tissue) Exposed: Yes Necrotic Quality: Adherent Slough Tendon Exposed: No Muscle Exposed: No Joint Exposed: No Bone Exposed: No Treatment Notes Wound #18 (Ankle) Wound Laterality: Left, Posterior Cleanser Soap and Water Discharge Instruction: May shower and wash wound with dial antibacterial soap and water prior to dressing change. Wound Cleanser Discharge Instruction: Cleanse the wound with wound cleanser prior to applying a clean dressing using gauze sponges, not tissue or cotton balls. Peri-Wound Care Triamcinolone 15 (g) Discharge Instruction: Use triamcinolone 15 (g) as directed Sween Lotion (Moisturizing lotion) Discharge Instruction: Apply moisturizing lotion as directed Topical Primary Dressing KerraCel Ag Gelling Fiber Dressing, 4x5 in (silver alginate) Discharge Instruction: Apply silver alginate to wound bed as instructed Secondary Dressing Woven Gauze Sponge, Non-Sterile 4x4 in Discharge Instruction: Apply over primary dressing as directed. Secured With Compression Wrap Kerlix Roll 4.5x3.1 (in/yd) Discharge Instruction: Apply Kerlix and Coban compression as directed. Coban Self-Adherent Wrap 4x5 (in/yd) Discharge Instruction: Apply over Kerlix as directed. Compression Stockings Add-Ons Electronic Signature(s) Signed: 07/09/2021 6:05:09 PM By: Dellie Catholic RN Entered By: Dellie Catholic on 07/09/2021  10:18:54 -------------------------------------------------------------------------------- Wound Assessment Details Patient Name: Date of Service: [name], [name] 07/09/2021 10:00 A M Medical Record Number: [id number] Patient Account Number: [id number] Date of Birth/Sex: Treating RN: 03/05/1971 (51 y.o. [name] Primary Care Huldah Marin: [name] Other Clinician: Referring Juelle Dickmann: Treating Jahnavi Muratore/Extender: Farrel Demark Weeks in Treatment: 11 Wound Status Wound Number: 19 Primary Etiology: Sickle Cell Lesion Wound Location: Left, Medial Ankle Wound Status: Open Wounding Event: Gradually Appeared Comorbid History: Anemia, Sickle Cell Disease, Neuropathy Date Acquired: 12/25/2020 Weeks Of Treatment: 11 Clustered Wound: No Photos Wound Measurements Length: (cm) 2 Width: (cm) 1.9 Depth: (  cm) 0.1 Area: (cm) 2.985 Volume: (cm) 0.298 % Reduction in Area: 85.9% % Reduction in Volume: 98% Epithelialization: Small (1-33%) Tunneling: No Undermining: No Wound Description Classification: Full Thickness Without Exposed Support Structures Wound Margin: Flat and Intact Exudate Amount: Medium Exudate Type: Serosanguineous Exudate Color: red, brown Foul Odor After Cleansing: No Slough/Fibrino Yes Wound Bed Granulation Amount: Medium (34-66%) Exposed Structure Granulation Quality: Red, Hyper-granulation Fascia Exposed: No Necrotic Amount: Medium (34-66%) Fat Layer (Subcutaneous Tissue) Exposed: Yes Necrotic Quality: Adherent Slough Tendon Exposed: No Muscle Exposed: No Joint Exposed: No Bone Exposed: No Treatment Notes Wound #19 (Ankle) Wound Laterality: Left, Medial Cleanser Soap and Water Discharge Instruction: May shower and wash wound with dial antibacterial soap and water prior to dressing change. Wound Cleanser Discharge Instruction: Cleanse the wound with wound cleanser prior to applying a clean dressing using gauze sponges, not  tissue or cotton balls. Peri-Wound Care Triamcinolone 15 (g) Discharge Instruction: Use triamcinolone 15 (g) as directed Sween Lotion (Moisturizing lotion) Discharge Instruction: Apply moisturizing lotion as directed Topical Primary Dressing KerraCel Ag Gelling Fiber Dressing, 4x5 in (silver alginate) Discharge Instruction: Apply silver alginate to wound bed as instructed Santyl Ointment Discharge Instruction: Apply nickel thick amount to wound bed as instructed Secondary Dressing ABD Pad, 5x9 Discharge Instruction: Apply over primary dressing as directed. Secured With Compression Wrap Kerlix Roll 4.5x3.1 (in/yd) Discharge Instruction: Apply Kerlix and Coban compression as directed. Coban Self-Adherent Wrap 4x5 (in/yd) Discharge Instruction: Apply over Kerlix as directed. Compression Stockings Add-Ons Electronic Signature(s) Signed: 07/09/2021 6:05:09 PM By: Dellie Catholic RN Entered By: Dellie Catholic on 07/09/2021 10:20:03 -------------------------------------------------------------------------------- Wound Assessment Details Patient Name: Date of Service: [name], [name] 07/09/2021 10:00 A M Medical Record Number: [id number] Patient Account Number: [id number] Date of Birth/Sex: Treating RN: February 18, 1971 (51 y.o. [name] Primary Care Morgane Joerger: [name] Other Clinician: Referring Khary Schaben: Treating Sharley Keeler/Extender: Farrel Demark Weeks in Treatment: 11 Wound Status Wound Number: 21 Primary Etiology: Sickle Cell Lesion Wound Location: Right, Medial Ankle Secondary Etiology: Venous Leg Ulcer Wounding Event: Gradually Appeared Wound Status: Open Date Acquired: 06/26/2021 Comorbid History: Anemia, Sickle Cell Disease, Neuropathy Weeks Of Treatment: 1 Clustered Wound: No Photos Wound Measurements Length: (cm) 7.3 Width: (cm) 3.8 Depth: (cm) 0.1 Area: (cm) 21.787 Volume: (cm) 2.179 % Reduction in Area: 24.8% % Reduction in  Volume: 24.8% Epithelialization: Small (1-33%) Wound Description Classification: Full Thickness Without Exposed Support Structures Exudate Amount: Large Exudate Type: Purulent Exudate Color: yellow, brown, green Foul Odor After Cleansing: No Slough/Fibrino Yes Wound Bed Granulation Amount: Small (1-33%) Exposed Structure Granulation Quality: Red Fascia Exposed: No Necrotic Amount: Large (67-100%) Fat Layer (Subcutaneous Tissue) Exposed: Yes Necrotic Quality: Adherent Slough Tendon Exposed: No Muscle Exposed: No Joint Exposed: No Bone Exposed: No Treatment Notes Wound #21 (Ankle) Wound Laterality: Right, Medial Cleanser Soap and Water Discharge Instruction: May shower and wash wound with dial antibacterial soap and water prior to dressing change. Wound Cleanser Discharge Instruction: Cleanse the wound with wound cleanser prior to applying a clean dressing using gauze sponges, not tissue or cotton balls. Peri-Wound Care Triamcinolone 15 (g) Discharge Instruction: Use triamcinolone 15 (g) as directed Sween Lotion (Moisturizing lotion) Discharge Instruction: Apply moisturizing lotion as directed Topical Primary Dressing KerraCel Ag Gelling Fiber Dressing, 4x5 in (silver alginate) Discharge Instruction: Apply silver alginate to wound bed as instructed Secondary Dressing ABD Pad, 5x9 Discharge Instruction: Apply over primary dressing as directed. Zetuvit Plus 4x8 in Discharge Instruction: Apply over primary dressing as directed. Secured With Compression  Wrap Kerlix Roll 4.5x3.1 (in/yd) Discharge Instruction: Apply Kerlix and Coban compression as directed. Coban Self-Adherent Wrap 4x5 (in/yd) Discharge Instruction: Apply over Kerlix as directed. Compression Stockings Add-Ons Electronic Signature(s) Signed: 07/09/2021 6:05:09 PM By: Dellie Catholic RN Entered By: Dellie Catholic on 07/09/2021  10:18:07 -------------------------------------------------------------------------------- Vitals Details Patient Name: Date of Service: [name], [name] 07/09/2021 10:00 A M Medical Record Number: [id number] Patient Account Number: [id number] Date of Birth/Sex: Treating RN: 1970-11-30 (51 y.o. [name] Primary Care Malina Geers: [name] Other Clinician: Referring Odysseus Cada: Treating Laroy Mustard/Extender: Farrel Demark Weeks in Treatment: 11 Vital Signs Time Taken: 09:50 Temperature (F): 98.1 Height (in): 67 Pulse (bpm): 90 Respiratory Rate (breaths/min): 16 Blood Pressure (mmHg): 128/83 Reference Range: 80 - 120 mg / dl Electronic Signature(s) Signed: 07/09/2021 6:05:09 PM By: Dellie Catholic RN Entered By: Dellie Catholic on 07/09/2021 09:51:04

## 2021-07-17 ENCOUNTER — Encounter (HOSPITAL_BASED_OUTPATIENT_CLINIC_OR_DEPARTMENT_OTHER): Payer: Medicaid Other | Admitting: Internal Medicine

## 2021-07-17 ENCOUNTER — Ambulatory Visit: Payer: Medicaid Other | Admitting: Family Medicine

## 2021-07-17 ENCOUNTER — Other Ambulatory Visit: Payer: Self-pay

## 2021-07-17 ENCOUNTER — Encounter: Payer: Self-pay | Admitting: Family Medicine

## 2021-07-17 ENCOUNTER — Ambulatory Visit (INDEPENDENT_AMBULATORY_CARE_PROVIDER_SITE_OTHER): Payer: Medicaid Other | Admitting: Family Medicine

## 2021-07-17 VITALS — BP 124/62 | HR 82 | Temp 98.6°F | Ht 67.0 in | Wt 131.2 lb

## 2021-07-17 DIAGNOSIS — L97922 Non-pressure chronic ulcer of unspecified part of left lower leg with fat layer exposed: Secondary | ICD-10-CM

## 2021-07-17 DIAGNOSIS — L97912 Non-pressure chronic ulcer of unspecified part of right lower leg with fat layer exposed: Secondary | ICD-10-CM

## 2021-07-17 DIAGNOSIS — E43 Unspecified severe protein-calorie malnutrition: Secondary | ICD-10-CM | POA: Diagnosis not present

## 2021-07-17 DIAGNOSIS — L97818 Non-pressure chronic ulcer of other part of right lower leg with other specified severity: Secondary | ICD-10-CM | POA: Diagnosis not present

## 2021-07-17 DIAGNOSIS — G894 Chronic pain syndrome: Secondary | ICD-10-CM

## 2021-07-17 DIAGNOSIS — D571 Sickle-cell disease without crisis: Secondary | ICD-10-CM

## 2021-07-17 DIAGNOSIS — N898 Other specified noninflammatory disorders of vagina: Secondary | ICD-10-CM

## 2021-07-17 DIAGNOSIS — E559 Vitamin D deficiency, unspecified: Secondary | ICD-10-CM | POA: Diagnosis not present

## 2021-07-17 LAB — POCT URINALYSIS DIP (CLINITEK)
Bilirubin, UA: NEGATIVE
Glucose, UA: NEGATIVE mg/dL
Ketones, POC UA: NEGATIVE mg/dL
Leukocytes, UA: NEGATIVE
Nitrite, UA: NEGATIVE
Spec Grav, UA: 1.01 (ref 1.010–1.025)
Urobilinogen, UA: 0.2 E.U./dL
pH, UA: 5.5 (ref 5.0–8.0)

## 2021-07-17 MED ORDER — JUVEN PO PACK: 1.0000 | PACK | Freq: Two times a day (BID) | ORAL | 5 refills | Status: DC

## 2021-07-17 MED ORDER — OXBRYTA 500 MG PO TABS: 1000.0000 mg | ORAL_TABLET | Freq: Every day | ORAL | 1 refills | Status: DC

## 2021-07-17 MED ORDER — ASPIRIN EC 81 MG PO TBEC: 81.0000 mg | DELAYED_RELEASE_TABLET | Freq: Every day | ORAL | 11 refills | Status: DC

## 2021-07-17 NOTE — Progress Notes (Signed)
[name], [name] ([id number]) Visit Report for 07/17/2021 Arrival Information Details Patient Name: Date of Service: [name] [name] 07/17/2021 11:45 A M Medical Record Number: [id number] Patient Account Number: [id number] Date of Birth/Sex: Treating RN: 01/12/1971 (51 y.o. [name] Primary Care Provider: [name] Other Clinician: Referring Provider: Treating Provider/Extender: Bernette Redbird in Treatment: 13 Visit Information History Since Last Visit Added or deleted any medications: No Patient Arrived: Wheel Chair Had a fall or experienced change in No Arrival Time: 11:44 activities of daily living that may affect Accompanied By: self risk of falls: Transfer Assistance: None Hospitalized since last visit: No Patient Identification Verified: Yes Has Dressing in Place as Prescribed: Yes Patient Requires Transmission-Based Precautions: No Pain Present Now: No Patient Has Alerts: No Electronic Signature(s) Signed: 07/17/2021 6:17:08 PM By: Dellie Catholic RN Entered By: Dellie Catholic on 07/17/2021 11:44:49 -------------------------------------------------------------------------------- Clinic Level of Care Assessment Details Patient Name: Date of Service: [name] [name] 07/17/2021 11:45 A M Medical Record Number: [id number] Patient Account Number: [id number] Date of Birth/Sex: Treating RN: 07/09/1970 (51 y.o. [name] Primary Care Provider: [name] Other Clinician: Referring Provider: Treating Provider/Extender: Bernette Redbird in Treatment: 13 Clinic Level of Care Assessment Items TOOL 4 Quantity Score X- 1 0 Use when only an EandM is performed on FOLLOW-UP visit ASSESSMENTS - Nursing Assessment / Reassessment X- 1 10 Reassessment of Co-morbidities (includes updates in patient status) X- 1 5 Reassessment of Adherence to Treatment Plan ASSESSMENTS - Wound and Skin A ssessment /  Reassessment [] - 0 Simple Wound Assessment / Reassessment - one wound X- 4 5 Complex Wound Assessment / Reassessment - multiple wounds [] - 0 Dermatologic / Skin Assessment (not related to wound area) ASSESSMENTS - Focused Assessment [] - 0 Circumferential Edema Measurements - multi extremities [] - 0 Nutritional Assessment / Counseling / Intervention [] - 0 Lower Extremity Assessment (monofilament, tuning fork, pulses) [] - 0 Peripheral Arterial Disease Assessment (using hand held doppler) ASSESSMENTS - Ostomy and/or Continence Assessment and Care [] - 0 Incontinence Assessment and Management [] - 0 Ostomy Care Assessment and Management (repouching, etc.) PROCESS - Coordination of Care [] - 0 Simple Patient / Family Education for ongoing care X- 1 20 Complex (extensive) Patient / Family Education for ongoing care X- 1 10 Staff obtains Programmer, systems, Records, T Results / Process Orders est X- 1 10 Staff telephones HHA, Nursing Homes / Clarify orders / etc [] - 0 Routine Transfer to another Facility (non-emergent condition) [] - 0 Routine Hospital Admission (non-emergent condition) [] - 0 New Admissions / Biomedical engineer / Ordering NPWT Apligraf, etc. , [] - 0 Emergency Hospital Admission (emergent condition) X- 1 10 Simple Discharge Coordination [] - 0 Complex (extensive) Discharge Coordination PROCESS - Special Needs [] - 0 Pediatric / Minor Patient Management [] - 0 Isolation Patient Management [] - 0 Hearing / Language / Visual special needs [] - 0 Assessment of Community assistance (transportation, D/C planning, etc.) [] - 0 Additional assistance / Altered mentation [] - 0 Support Surface(s) Assessment (bed, cushion, seat, etc.) INTERVENTIONS - Wound Cleansing / Measurement [] - 0 Simple Wound Cleansing - one wound X- 4 5 Complex Wound Cleansing - multiple wounds X- 1 5 Wound Imaging (photographs - any number of wounds) [] - 0 Wound Tracing  (instead of photographs) [] - 0 Simple Wound Measurement - one wound X- 4 5 Complex Wound Measurement - multiple wounds INTERVENTIONS - Wound  Dressings X - Small Wound Dressing one or multiple wounds 4 10 [] - 0 Medium Wound Dressing one or multiple wounds [] - 0 Large Wound Dressing one or multiple wounds [] - 0 Application of Medications - topical [] - 0 Application of Medications - injection INTERVENTIONS - Miscellaneous [] - 0 External ear exam [] - 0 Specimen Collection (cultures, biopsies, blood, body fluids, etc.) [] - 0 Specimen(s) / Culture(s) sent or taken to Lab for analysis [] - 0 Patient Transfer (multiple staff / Civil Service fast streamer / Similar devices) [] - 0 Simple Staple / Suture removal (25 or less) [] - 0 Complex Staple / Suture removal (26 or more) [] - 0 Hypo / Hyperglycemic Management (close monitor of Blood Glucose) [] - 0 Ankle / Brachial Index ([name]) - do not check if billed separately X- 1 5 Vital Signs Has the patient been seen at the hospital within the last three years: Yes Total Score: 175 Level Of Care: New/Established - Level 5 Electronic Signature(s) Signed: 07/17/2021 6:17:08 PM By: Dellie Catholic RN Entered By: Dellie Catholic on 07/17/2021 18:05:37 -------------------------------------------------------------------------------- Encounter Discharge Information Details Patient Name: Date of Service: [name], [name] 07/17/2021 11:45 A M Medical Record Number: [id number] Patient Account Number: [id number] Date of Birth/Sex: Treating RN: Jan 12, 1971 (51 y.o. [name] Primary Care Diamantina Edinger: [name] Other Clinician: Referring Danish Ruffins: Treating Shea Kapur/Extender: Bernette Redbird in Treatment: 13 Encounter Discharge Information Items Discharge Condition: Stable Ambulatory Status: Wheelchair Discharge Destination: Home Transportation: Private Auto Accompanied By: self Schedule Follow-up Appointment:  Yes Clinical Summary of Care: Patient Declined Electronic Signature(s) Signed: 07/17/2021 6:17:08 PM By: Dellie Catholic RN Entered By: Dellie Catholic on 07/17/2021 18:06:39 -------------------------------------------------------------------------------- Lower Extremity Assessment Details Patient Name: Date of Service: [name], [name] 07/17/2021 11:45 A M Medical Record Number: [id number] Patient Account Number: [id number] Date of Birth/Sex: Treating RN: Sep 11, 1970 (51 y.o. [name] Primary Care Betzaira Mentel: [name] Other Clinician: Referring Bernadette Gores: Treating Colum Colt/Extender: Farrel Demark Weeks in Treatment: 13 Edema Assessment Assessed: Shirlyn Goltz: No] [Right: No] Edema: [Left: No] [Right: No] Calf Left: Right: Point of Measurement: 33 cm From Medial Instep 27 cm 29.5 cm Ankle Left: Right: Point of Measurement: 10 cm From Medial Instep 16.7 cm 19.5 cm Vascular Assessment Pulses: Dorsalis Pedis Palpable: [Left:Yes] [Right:Yes] Electronic Signature(s) Signed: 07/17/2021 6:17:08 PM By: Dellie Catholic RN Entered By: Dellie Catholic on 07/17/2021 11:53:56 -------------------------------------------------------------------------------- Multi Wound Chart Details Patient Name: Date of Service: [name], [name] 07/17/2021 11:45 A M Medical Record Number: [id number] Patient Account Number: [id number] Date of Birth/Sex: Treating RN: 25-Mar-1971 (51 y.o. F) Primary Care Camisha Srey: [name] Other Clinician: Referring Devonn Giampietro: Treating [name]/Extender: Farrel Demark Weeks in Treatment: 13 Vital Signs Height(in): 77 Pulse(bpm): 36 Weight(lbs): Blood Pressure(mmHg): 135/83 Body Mass Index(BMI): Temperature(F): 98.4 Respiratory Rate(breaths/min): 18 Photos: Right, Lateral Lower Leg Left, Posterior Ankle Left, Posterior Ankle Wound Location: Gradually Appeared Gradually Appeared Gradually Appeared Wounding  Event: Sickle Cell Lesion Sickle Cell Lesion Sickle Cell Lesion Primary Etiology: N/A N/A N/A Secondary Etiology: Anemia, Sickle Cell Disease, Anemia, Sickle Cell Disease, Anemia, Sickle Cell Disease, Comorbid History: Neuropathy Neuropathy Neuropathy 10/05/2012 12/25/2020 12/25/2020 Date Acquired: _0 Weeks of Treatment: Open Open Open Wound Status: No No No Wound Recurrence: Yes No No Clustered Wound: 9.6x5.5x0.1 1x1x0.1 1x1x0.1 Measurements L x W x D (cm) 41.469 0.785 0.785 A (cm) : rea 4.147 0.079 0.079 Volume (cm) : 77.90% 94.40% 94.40% % Reduction in  Area: 77.90% 98.10% 98.10% % Reduction in Volume: Full Thickness Without Exposed Full Thickness Without Exposed Full Thickness Without Exposed Classification: Support Structures Support Structures Support Structures Large Medium Medium Exudate Amount: Purulent Serosanguineous Serosanguineous Exudate Type: yellow, [name], green red, [name] red, [name] Exudate Color: N/A Flat and Intact Flat and Intact Wound Margin: Small (1-33%) Large (67-100%) Large (67-100%) Granulation Amount: Pink Red, Pink Red, Pink Granulation Quality: Large (67-100%) None Present (0%) None Present (0%) Necrotic Amount: Fat Layer (Subcutaneous Tissue): Yes Fat Layer (Subcutaneous Tissue): Yes Fat Layer (Subcutaneous Tissue): Yes Exposed Structures: Fascia: No Fascia: No Fascia: No Tendon: No Tendon: No Tendon: No Muscle: No Muscle: No Muscle: No Joint: No Joint: No Joint: No Bone: No Bone: No Bone: No Small (1-33%) Small (1-33%) Small (1-33%) Epithelialization: Wound Number: [id number] 21 N/A Photos: N/A Left, Medial Ankle Right, Medial Ankle N/A Wound Location: Gradually Appeared Gradually Appeared N/A Wounding Event: Sickle Cell Lesion Sickle Cell Lesion N/A Primary Etiology: N/A Venous Leg Ulcer N/A Secondary Etiology: Anemia, Sickle Cell Disease, Anemia, Sickle Cell Disease, N/A Comorbid History: Neuropathy  Neuropathy 12/25/2020 06/26/2021 N/A Date Acquired: 13 3 N/A Weeks of Treatment: Open Open N/A Wound Status: No No N/A Wound Recurrence: No No N/A Clustered Wound: 2.3x1.9x0.1 6.6x4x0.1 N/A Measurements L x W x D (cm) 3.432 20.735 N/A A (cm) : rea 0.343 2.073 N/A Volume (cm) : 83.80% 28.50% N/A % Reduction in Area: 97.70% 28.50% N/A % Reduction in Volume: Full Thickness Without Exposed Full Thickness Without Exposed N/A Classification: Support Structures Support Structures Medium Large N/A Exudate Amount: Serosanguineous Purulent N/A Exudate Type: red, [name] yellow, [name], green N/A Exudate Color: Flat and Intact N/A N/A Wound Margin: Large (67-100%) Medium (34-66%) N/A Granulation Amount: Red Red N/A Granulation Quality: Small (1-33%) Medium (34-66%) N/A Necrotic Amount: Fat Layer (Subcutaneous Tissue): Yes Fat Layer (Subcutaneous Tissue): Yes N/A Exposed Structures: Fascia: No Fascia: No Tendon: No Tendon: No Muscle: No Muscle: No Joint: No Joint: No Bone: No Bone: No Small (1-33%) Small (1-33%) N/A Epithelialization: Treatment Notes Electronic Signature(s) Signed: 07/17/2021 4:13:40 PM By: Linton Ham MD Entered By: Linton Ham on 07/17/2021 12:30:36 -------------------------------------------------------------------------------- Multi-Disciplinary Care Plan Details Patient Name: Date of Service: [name], [name] 07/17/2021 11:45 A M Medical Record Number: [id number] Patient Account Number: [id number] Date of Birth/Sex: Treating RN: 02-28-1971 (51 y.o. [name] Primary Care Daemon Dowty: [name] Other Clinician: Referring Cache Decoursey: Treating Tawan Corkern/Extender: Bernette Redbird in Treatment: 13 Multidisciplinary Care Plan reviewed with physician Active Inactive Venous Leg Ulcer Nursing Diagnoses: Actual venous Insuffiency (use after diagnosis is confirmed) Knowledge deficit related to disease process  and management Goals: Patient will maintain optimal edema control Date Initiated: 06/26/2021 Target Resolution Date: 07/24/2021 Goal Status: Active Interventions: Assess peripheral edema status every visit. Compression as ordered Treatment Activities: Therapeutic compression applied : 06/26/2021 Notes: Wound/Skin Impairment Nursing Diagnoses: Impaired tissue integrity Goals: Patient/caregiver will verbalize understanding of skin care regimen Date Initiated: 04/17/2021 Target Resolution Date: 07/24/2021 Goal Status: Active Ulcer/skin breakdown will have a volume reduction of 30% by week 4 Date Initiated: 04/17/2021 Date Inactivated: 05/29/2021 Target Resolution Date: 05/15/2021 Goal Status: Met Ulcer/skin breakdown will have a volume reduction of 50% by week 8 Date Initiated: 05/29/2021 Date Inactivated: 06/26/2021 Target Resolution Date: 06/26/2021 Goal Status: Unmet Unmet Reason: venous reflux Ulcer/skin breakdown will have a volume reduction of 80% by week 12 Date Initiated: 06/26/2021 Target Resolution Date: 07/24/2021 Goal Status: Active Interventions: Assess patient/caregiver ability to obtain necessary supplies Assess patient/caregiver  ability to perform ulcer/skin care regimen upon admission and as needed Assess ulceration(s) every visit Provide education on ulcer and skin care Treatment Activities: Topical wound management initiated : 04/17/2021 Notes: 06/08/21: Left leg wounds greater than 30% volume reduction, right leg acute infection. Electronic Signature(s) Signed: 07/17/2021 6:17:08 PM By: Dellie Catholic RN Entered By: Dellie Catholic on 07/17/2021 18:01:52 -------------------------------------------------------------------------------- Pain Assessment Details Patient Name: Date of Service: [name], [place] [name] 07/17/2021 11:45 A M Medical Record Number: [id number] Patient Account Number: [id number] Date of Birth/Sex: Treating RN: 12/13/1970 (51 y.o. [name] Primary Care Provider: [name] Other Clinician: Referring Provider: Treating Provider/Extender: Farrel Demark Weeks in Treatment: 13 Active Problems Location of Pain Severity and Description of Pain Patient Has Paino Yes Site Locations Pain Location: Pain Location: Pain in Ulcers With Dressing Change: Yes Duration of the Pain. Constant / Intermittento Intermittent Rate the pain. Current Pain Level: 4 Worst Pain Level: 5 Least Pain Level: 0 Character of Pain Describe the Pain: Burning Pain Management and Medication Current Pain Management: Medication: Yes Is the Current Pain Management Adequate: Adequate Rest: Yes How does your wound impact your activities of daily livingo Sleep: No Bathing: No Appetite: No Relationship With Others: No Bladder Continence: No Emotions: No Bowel Continence: No Work: No Toileting: No Drive: No Dressing: No Hobbies: No Electronic Signature(s) Signed: 07/17/2021 6:17:08 PM By: Dellie Catholic RN Entered By: Dellie Catholic on 07/17/2021 11:51:46 -------------------------------------------------------------------------------- Patient/Caregiver Education Details Patient Name: Date of Service: [name], [name] 2/28/2023andnbsp11:45 A M Medical Record Number: [id number] Patient Account Number: [id number] Date of Birth/Gender: Treating RN: [birth year] (51 y.o. [name] Primary Care Physician: [name] Other Clinician: Referring Physician: Treating Physician/Extender: Bernette Redbird in Treatment: 13 Education Assessment Education Provided To: Patient Education Topics Provided Wound/Skin Impairment: Methods: Explain/Verbal Responses: Return demonstration correctly Electronic Signature(s) Signed: 07/17/2021 6:17:08 PM By: Dellie Catholic RN Entered By: Dellie Catholic on 07/17/2021  18:02:10 -------------------------------------------------------------------------------- Wound Assessment Details Patient Name: Date of Service: [name], [place] [name] 07/17/2021 11:45 A M Medical Record Number: [id number] Patient Account Number: [id number] Date of Birth/Sex: Treating RN: 1970-12-28 (51 y.o. [name] Primary Care Provider: [name] Other Clinician: Referring Provider: Treating Provider/Extender: Farrel Demark Weeks in Treatment: 13 Wound Status Wound Number: 17 Primary Etiology: Sickle Cell Lesion Wound Location: Right, Lateral Lower Leg Wound Status: Open Wounding Event: Gradually Appeared Comorbid History: Anemia, Sickle Cell Disease, Neuropathy Date Acquired: 10/05/2012 Weeks Of Treatment: 13 Clustered Wound: Yes Photos Wound Measurements Length: (cm) 9.6 Width: (cm) 5.5 Depth: (cm) 0.1 Area: (cm) 41.469 Volume: (cm) 4.147 % Reduction in Area: 77.9% % Reduction in Volume: 77.9% Epithelialization: Small (1-33%) Tunneling: No Undermining: No Wound Description Classification: Full Thickness Without Exposed Support Structures Exudate Amount: Large Exudate Type: Purulent Exudate Color: yellow, [name], green Foul Odor After Cleansing: No Slough/Fibrino Yes Wound Bed Granulation Amount: Small (1-33%) Exposed Structure Granulation Quality: Pink Fascia Exposed: No Necrotic Amount: Large (67-100%) Fat Layer (Subcutaneous Tissue) Exposed: Yes Necrotic Quality: Adherent Slough Tendon Exposed: No Muscle Exposed: No Joint Exposed: No Bone Exposed: No Treatment Notes Wound #17 (Lower Leg) Wound Laterality: Right, Lateral Cleanser Soap and Water Discharge Instruction: May shower and wash wound with dial antibacterial soap and water prior to dressing change. Wound Cleanser Discharge Instruction: Cleanse the wound with wound cleanser prior to applying a clean dressing using gauze sponges, not tissue or cotton  balls. Peri-Wound Care Triamcinolone 15 (g) Discharge Instruction: Use  triamcinolone 15 (g) as directed Sween Lotion (Moisturizing lotion) Discharge Instruction: Apply moisturizing lotion as directed Topical Primary Dressing KerraCel Ag Gelling Fiber Dressing, 4x5 in (silver alginate) Discharge Instruction: Apply silver alginate to wound bed as instructed Santyl Ointment Discharge Instruction: Apply nickel thick amount to wound bed as instructed Secondary Dressing ABD Pad, 5x9 Discharge Instruction: Apply over primary dressing as directed. Zetuvit Plus 4x8 in Discharge Instruction: Apply over primary dressing as directed. Secured With Compression Wrap Kerlix Roll 4.5x3.1 (in/yd) Discharge Instruction: Apply Kerlix and Coban compression as directed. Coban Self-Adherent Wrap 4x5 (in/yd) Discharge Instruction: Apply over Kerlix as directed. Compression Stockings Add-Ons Electronic Signature(s) Signed: 07/17/2021 6:00:57 PM By: Baruch Gouty RN, BSN Signed: 07/17/2021 6:17:08 PM By: Dellie Catholic RN Entered By: Baruch Gouty on 07/17/2021 12:03:38 -------------------------------------------------------------------------------- Wound Assessment Details Patient Name: Date of Service: [name], [name] 07/17/2021 11:45 A M Medical Record Number: [id number] Patient Account Number: [id number] Date of Birth/Sex: Treating RN: Dec 11, 1970 (51 y.o. [name] Primary Care Provider: [name] Other Clinician: Referring Provider: Treating Provider/Extender: Farrel Demark Weeks in Treatment: 13 Wound Status Wound Number: 18 Primary Etiology: Sickle Cell Lesion Wound Location: Left, Posterior Ankle Wound Status: Open Wounding Event: Gradually Appeared Comorbid History: Anemia, Sickle Cell Disease, Neuropathy Date Acquired: 12/25/2020 Weeks Of Treatment: 13 Clustered Wound: No Photos Wound Measurements Length: (cm) 1 Width: (cm) 1 Depth: (cm)  0.1 Area: (cm) 0.785 Volume: (cm) 0.079 % Reduction in Area: 94.4% % Reduction in Volume: 98.1% Epithelialization: Small (1-33%) Tunneling: No Undermining: No Wound Description Classification: Full Thickness Without Exposed Support Structures Wound Margin: Flat and Intact Exudate Amount: Medium Exudate Type: Serosanguineous Exudate Color: red, [name] Foul Odor After Cleansing: No Slough/Fibrino Yes Wound Bed Granulation Amount: Large (67-100%) Exposed Structure Granulation Quality: Red, Pink Fascia Exposed: No Necrotic Amount: None Present (0%) Fat Layer (Subcutaneous Tissue) Exposed: Yes Tendon Exposed: No Muscle Exposed: No Joint Exposed: No Bone Exposed: No Electronic Signature(s) Signed: 07/17/2021 6:00:57 PM By: Baruch Gouty RN, BSN Signed: 07/17/2021 6:17:08 PM By: Dellie Catholic RN Entered By: Baruch Gouty on 07/17/2021 12:02:26 -------------------------------------------------------------------------------- Wound Assessment Details Patient Name: Date of Service: [name], [name] 07/17/2021 11:45 A M Medical Record Number: [id number] Patient Account Number: [id number] Date of Birth/Sex: Treating RN: 05/18/1971 (51 y.o. [name] Primary Care Provider: [name] Other Clinician: Referring Provider: Treating Provider/Extender: Farrel Demark Weeks in Treatment: 13 Wound Status Wound Number: 18 Primary Etiology: Sickle Cell Lesion Wound Location: Left, Posterior Ankle Wound Status: Open Wounding Event: Gradually Appeared Comorbid History: Anemia, Sickle Cell Disease, Neuropathy Date Acquired: 12/25/2020 Weeks Of Treatment: 13 Clustered Wound: No Wound Measurements Length: (cm) 1 Width: (cm) 1 Depth: (cm) 0.1 Area: (cm) 0.785 Volume: (cm) 0.079 % Reduction in Area: 94.4% % Reduction in Volume: 98.1% Epithelialization: Small (1-33%) Tunneling: No Undermining: No Wound Description Classification: Full Thickness  Without Exposed Support Structures Wound Margin: Flat and Intact Exudate Amount: Medium Exudate Type: Serosanguineous Exudate Color: red, [name] Foul Odor After Cleansing: No Slough/Fibrino Yes Wound Bed Granulation Amount: Large (67-100%) Exposed Structure Granulation Quality: Red, Pink Fascia Exposed: No Necrotic Amount: None Present (0%) Fat Layer (Subcutaneous Tissue) Exposed: Yes Tendon Exposed: No Muscle Exposed: No Joint Exposed: No Bone Exposed: No Treatment Notes Wound #18 (Ankle) Wound Laterality: Left, Posterior Cleanser Soap and Water Discharge Instruction: May shower and wash wound with dial antibacterial soap and water prior to dressing change. Wound Cleanser Discharge Instruction: Cleanse the wound with wound cleanser prior  to applying a clean dressing using gauze sponges, not tissue or cotton balls. Peri-Wound Care Triamcinolone 15 (g) Discharge Instruction: Use triamcinolone 15 (g) as directed Sween Lotion (Moisturizing lotion) Discharge Instruction: Apply moisturizing lotion as directed Topical Primary Dressing KerraCel Ag Gelling Fiber Dressing, 4x5 in (silver alginate) Discharge Instruction: Apply silver alginate to wound bed as instructed Secondary Dressing Woven Gauze Sponge, Non-Sterile 4x4 in Discharge Instruction: Apply over primary dressing as directed. Secured With Compression Wrap Kerlix Roll 4.5x3.1 (in/yd) Discharge Instruction: Apply Kerlix and Coban compression as directed. Coban Self-Adherent Wrap 4x5 (in/yd) Discharge Instruction: Apply over Kerlix as directed. Compression Stockings Add-Ons Electronic Signature(s) Signed: 07/17/2021 6:17:08 PM By: Dellie Catholic RN Entered By: Dellie Catholic on 07/17/2021 12:03:01 -------------------------------------------------------------------------------- Wound Assessment Details Patient Name: Date of Service: [name], [place] [name] 07/17/2021 11:45 A M Medical Record Number: [id number] Patient  Account Number: [id number] Date of Birth/Sex: Treating RN: 08-09-1970 (51 y.o. [name] Primary Care Provider: [name] Other Clinician: Referring Provider: Treating Provider/Extender: Farrel Demark Weeks in Treatment: 13 Wound Status Wound Number: 19 Primary Etiology: Sickle Cell Lesion Wound Location: Left, Medial Ankle Wound Status: Open Wounding Event: Gradually Appeared Comorbid History: Anemia, Sickle Cell Disease, Neuropathy Date Acquired: 12/25/2020 Weeks Of Treatment: 13 Clustered Wound: No Photos Wound Measurements Length: (cm) 2.3 Width: (cm) 1.9 Depth: (cm) 0.1 Area: (cm) 3.432 Volume: (cm) 0.343 % Reduction in Area: 83.8% % Reduction in Volume: 97.7% Epithelialization: Small (1-33%) Tunneling: No Undermining: No Wound Description Classification: Full Thickness Without Exposed Support Structures Wound Margin: Flat and Intact Exudate Amount: Medium Exudate Type: Serosanguineous Exudate Color: red, [name] Foul Odor After Cleansing: No Slough/Fibrino Yes Wound Bed Granulation Amount: Large (67-100%) Exposed Structure Granulation Quality: Red Fascia Exposed: No Necrotic Amount: Small (1-33%) Fat Layer (Subcutaneous Tissue) Exposed: Yes Necrotic Quality: Adherent Slough Tendon Exposed: No Muscle Exposed: No Joint Exposed: No Bone Exposed: No Treatment Notes Wound #19 (Ankle) Wound Laterality: Left, Medial Cleanser Soap and Water Discharge Instruction: May shower and wash wound with dial antibacterial soap and water prior to dressing change. Wound Cleanser Discharge Instruction: Cleanse the wound with wound cleanser prior to applying a clean dressing using gauze sponges, not tissue or cotton balls. Peri-Wound Care Triamcinolone 15 (g) Discharge Instruction: Use triamcinolone 15 (g) as directed Sween Lotion (Moisturizing lotion) Discharge Instruction: Apply moisturizing lotion as directed Topical Primary  Dressing KerraCel Ag Gelling Fiber Dressing, 4x5 in (silver alginate) Discharge Instruction: Apply silver alginate to wound bed as instructed Santyl Ointment Discharge Instruction: Apply nickel thick amount to wound bed as instructed Secondary Dressing ABD Pad, 5x9 Discharge Instruction: Apply over primary dressing as directed. Secured With Compression Wrap Kerlix Roll 4.5x3.1 (in/yd) Discharge Instruction: Apply Kerlix and Coban compression as directed. Coban Self-Adherent Wrap 4x5 (in/yd) Discharge Instruction: Apply over Kerlix as directed. Compression Stockings Add-Ons Electronic Signature(s) Signed: 07/17/2021 6:00:57 PM By: Baruch Gouty RN, BSN Signed: 07/17/2021 6:17:08 PM By: Dellie Catholic RN Entered By: Baruch Gouty on 07/17/2021 12:01:25 -------------------------------------------------------------------------------- Wound Assessment Details Patient Name: Date of Service: [name], [name] 07/17/2021 11:45 A M Medical Record Number: [id number] Patient Account Number: [id number] Date of Birth/Sex: Treating RN: [birth year] (51 y.o. [name] Primary Care Provider: [name] Other Clinician: Referring Provider: Treating Provider/Extender: Farrel Demark Weeks in Treatment: 13 Wound Status Wound Number: 21 Primary Etiology: Sickle Cell Lesion Wound Location: Right, Medial Ankle Secondary Etiology: Venous Leg Ulcer Wounding Event: Gradually Appeared Wound Status: Open Date Acquired: 06/26/2021 Comorbid History: Anemia,  Sickle Cell Disease, Neuropathy Weeks Of Treatment: 3 Clustered Wound: No Photos Wound Measurements Length: (cm) 6.6 Width: (cm) 4 Depth: (cm) 0.1 Area: (cm) 20.735 Volume: (cm) 2.073 % Reduction in Area: 28.5% % Reduction in Volume: 28.5% Epithelialization: Small (1-33%) Tunneling: No Undermining: No Wound Description Classification: Full Thickness Without Exposed Support Structures Exudate Amount:  Large Exudate Type: Purulent Exudate Color: yellow, [name], green Foul Odor After Cleansing: No Slough/Fibrino Yes Wound Bed Granulation Amount: Medium (34-66%) Exposed Structure Granulation Quality: Red Fascia Exposed: No Necrotic Amount: Medium (34-66%) Fat Layer (Subcutaneous Tissue) Exposed: Yes Necrotic Quality: Adherent Slough Tendon Exposed: No Muscle Exposed: No Joint Exposed: No Bone Exposed: No Treatment Notes Wound #21 (Ankle) Wound Laterality: Right, Medial Cleanser Soap and Water Discharge Instruction: May shower and wash wound with dial antibacterial soap and water prior to dressing change. Wound Cleanser Discharge Instruction: Cleanse the wound with wound cleanser prior to applying a clean dressing using gauze sponges, not tissue or cotton balls. Peri-Wound Care Triamcinolone 15 (g) Discharge Instruction: Use triamcinolone 15 (g) as directed Sween Lotion (Moisturizing lotion) Discharge Instruction: Apply moisturizing lotion as directed Topical Primary Dressing KerraCel Ag Gelling Fiber Dressing, 4x5 in (silver alginate) Discharge Instruction: Apply silver alginate to wound bed as instructed Secondary Dressing ABD Pad, 5x9 Discharge Instruction: Apply over primary dressing as directed. Zetuvit Plus 4x8 in Discharge Instruction: Apply over primary dressing as directed. Secured With Compression Wrap Kerlix Roll 4.5x3.1 (in/yd) Discharge Instruction: Apply Kerlix and Coban compression as directed. Coban Self-Adherent Wrap 4x5 (in/yd) Discharge Instruction: Apply over Kerlix as directed. Compression Stockings Add-Ons Electronic Signature(s) Signed: 07/17/2021 6:00:57 PM By: Baruch Gouty RN, BSN Signed: 07/17/2021 6:17:08 PM By: Dellie Catholic RN Entered By: Baruch Gouty on 07/17/2021 12:04:24 -------------------------------------------------------------------------------- Vitals Details Patient Name: Date of Service: [name], [name] 07/17/2021  11:45 A M Medical Record Number: [id number] Patient Account Number: [id number] Date of Birth/Sex: Treating RN: 1970/05/29 (51 y.o. [name] Primary Care Provider: [name] Other Clinician: Referring Provider: Treating Provider/Extender: Farrel Demark Weeks in Treatment: 13 Vital Signs Time Taken: 11:45 Temperature (F): 98.4 Height (in): 67 Pulse (bpm): 91 Respiratory Rate (breaths/min): 18 Blood Pressure (mmHg): 135/83 Reference Range: 80 - 120 mg / dl Electronic Signature(s) Signed: 07/17/2021 6:17:08 PM By: Dellie Catholic RN Entered By: Dellie Catholic on 07/17/2021 11:45:31

## 2021-07-17 NOTE — Progress Notes (Unsigned)
Patient [facility]   Established Patient Office Visit  Subjective:  Patient ID: [name], female    DOB: 11-20-1970  Age: 51 y.o. MRN: [id number]  CC:  Chief Complaint  Patient presents with   Follow-up    Pt is here today for her 3 month follow up visit with no issues or concerns to discuss.    HPI [name] presents for ***  Past Medical History:  Diagnosis Date   Anemia 03/30/2012   Hx of sickle cell disease   CAP (community acquired pneumonia)    2014   Cholelithiasis 03/30/2012   Chronic wound of extremity 04/01/2012   Elevated LFTs    Leg ulcer ([place]) 10/27/2012   Chronic under care of wound clinic   Multiple open wounds of lower extremity    chronic wounds B/LLE   Sickle cell disease ([place])    Sinus bradycardia by electrocardiogram 04/03/2012    Past Surgical History:  Procedure Laterality Date   ALLOGRAFT APPLICATION Right 2/84/1324   Procedure: SURGICAL PREP FOR GRAFTING RIGHT LOWER EXTREMITY AND APPLICATION OF Jannifer Hick;  Surgeon: Irene Limbo, MD;  Location: [facility];  Service: Plastics;  Laterality: Right;   APPLICATION OF A-CELL OF EXTREMITY Right 10/09/2015   Procedure: APPLICATION OF Jannifer Hick;  Surgeon: Irene Limbo, MD;  Location: [facility];  Service: Plastics;  Laterality: Right;   BREAST SURGERY     left breast cyst aspiration   CESAREAN SECTION     CESAREAN SECTION N/A 01/06/2014   Procedure: CESAREAN SECTION;  Surgeon: Emily Filbert, MD;  Location: [facility];  Service: Obstetrics;  Laterality: N/A;   CESAREAN SECTION N/A 05/04/2017   Procedure: CESAREAN SECTION;  Surgeon: Woodroe Mode, MD;  Location: [facility];  Service: Obstetrics;  Laterality: N/A;   I & D EXTREMITY Right 10/09/2015   Procedure: SURGICAL PREPARATION FOR GRAFTING RIGHT ANKLE AND APPLICATION THERASKIN;  Surgeon: Irene Limbo, MD;  Location: [facility];  Service:  Plastics;  Laterality: Right;   SKIN FULL THICKNESS GRAFT Bilateral 06/17/2016   Procedure: SURGICAL PREP FOR GRAFTING, BILATERAL LOWER EXTREMITIES AND APPLICATION OF Jannifer Hick;  Surgeon: Irene Limbo, MD;  Location: [facility];  Service: Plastics;  Laterality: Bilateral;   TONSILLECTOMY      Family History  Problem Relation Age of Onset   Sickle cell anemia Sister    Diabetes Mother    Asthma Mother    Sickle cell trait Mother    Sickle cell trait Father    Hypertension Father     Social History   Socioeconomic History   Marital status: Married    Spouse name: [name]   Number of children: 1   Years of education: Not on file   Highest education level: Not on file  Occupational History   Occupation: Employed in home care.  Tobacco Use   Smoking status: Never    Passive exposure: Never   Smokeless tobacco: Never  Vaping Use   Vaping Use: Never used  Substance and Sexual Activity   Alcohol use: No   Drug use: No   Sexual activity: Yes    Birth control/protection: None  Other Topics Concern   Not on file  Social History Narrative   Lives with husband.   Social Determinants of Health   Financial Resource Strain: Not on file  Food Insecurity: Not on file  Transportation Needs: Not on file  Physical Activity: Not on file  Stress:  Not on file  Social Connections: Not on file  Intimate Partner Violence: Not on file    Outpatient Medications Prior to Visit  Medication Sig Dispense Refill   Deferiprone 500 MG TABS Take 500 mg by mouth daily at 12 noon. 90 tablet 2   feeding supplement (ENSURE ENLIVE / ENSURE PLUS) LIQD Take 237 mLs by mouth 2 (two) times daily between meals. 030 mL 12   folic acid (FOLVITE) 1 MG tablet Take 5 tablets (5 mg total) by mouth daily. (Patient taking differently: Take 1 mg by mouth daily.) 150 tablet 11   gabapentin (NEURONTIN) 300 MG capsule TAKE 1 CAPSULE(300 MG) BY MOUTH THREE TIMES DAILY 90 capsule 2   ibuprofen (ADVIL) 600 MG  tablet TAKE 1 TABLET(600 MG) BY MOUTH EVERY 6 HOURS (Patient taking differently: Take 600 mg by mouth every 6 (six) hours as needed for mild pain.) 60 tablet 2   Oxycodone HCl 10 MG TABS Take 1 tablet (10 mg total) by mouth every 4 (four) hours while awake. 90 tablet 0   nutrition supplement, JUVEN, (JUVEN) PACK Take 1 packet by mouth 2 (two) times daily between meals. 30 each 5   No facility-administered medications prior to visit.    No Known Allergies  ROS Review of Systems  Constitutional:  Negative for activity change, appetite change and fatigue.  HENT: Negative.    Eyes: Negative.   Respiratory: Negative.    Cardiovascular: Negative.   Gastrointestinal: Negative.   Endocrine: Negative.   Genitourinary: Negative.   Musculoskeletal:  Positive for back pain and joint swelling.  Skin:  Positive for wound.  Neurological: Negative.   Psychiatric/Behavioral: Negative.       Objective:    Physical Exam Constitutional:      Appearance: Normal appearance.  Eyes:     Pupils: Pupils are equal, round, and reactive to light.  Cardiovascular:     Rate and Rhythm: Normal rate and regular rhythm.     Pulses: Normal pulses.     Heart sounds: Normal heart sounds.  Pulmonary:     Effort: Pulmonary effort is normal.  Abdominal:     General: Bowel sounds are normal.  Neurological:     General: No focal deficit present.     Mental Status: She is alert. Mental status is at baseline.  Psychiatric:        Mood and Affect: Mood normal.   BP 124/62    Pulse 82    Temp 98.6 F (37 C)    Ht 5' 7"  (1.702 m)    Wt 131 lb 3.2 oz (59.5 kg)    SpO2 96%    BMI 20.55 kg/m  Wt Readings from Last 3 Encounters:  07/17/21 131 lb 3.2 oz (59.5 kg)  04/06/21 118 lb 13.3 oz (53.9 kg)  06/08/19 148 lb (67.1 kg)     Health Maintenance Due  Topic Date Due   COVID-19 Vaccine (1) Never done   COLONOSCOPY (Pts 45-4yr Insurance coverage will need to be confirmed)  Never done   PAP SMEAR-Modifier   12/17/2019    There are no preventive care reminders to display for this patient.  Lab Results  Component Value Date   TSH 0.099 (L) 03/20/2017   Lab Results  Component Value Date   WBC 10.9 (H) 07/03/2021   HGB 7.8 (L) 07/03/2021   HCT 22.8 (L) 07/03/2021   MCV 87 07/03/2021   PLT 405 07/03/2021   Lab Results  Component Value Date   NA  137 07/03/2021   K 4.9 07/03/2021   CHLORIDE 107 01/20/2014   CO2 21 07/03/2021   GLUCOSE 70 07/03/2021   BUN 9 07/03/2021   CREATININE 0.45 (L) 07/03/2021   BILITOT 2.5 (H) 07/03/2021   ALKPHOS 66 07/03/2021   AST 47 (H) 07/03/2021   ALT 31 07/03/2021   PROT 7.9 07/03/2021   ALBUMIN 4.5 07/03/2021   CALCIUM 9.4 07/03/2021   ANIONGAP 7 04/09/2021   EGFR 118 07/03/2021   Lab Results  Component Value Date   CHOL 143 11/25/2014   Lab Results  Component Value Date   HDL 27 (L) 11/25/2014   Lab Results  Component Value Date   LDLCALC 89 11/25/2014   Lab Results  Component Value Date   TRIG 137 11/25/2014   Lab Results  Component Value Date   CHOLHDL 5.3 11/25/2014   Lab Results  Component Value Date   HGBA1C 4.4 (L) 03/20/2017      Assessment & Plan:   Problem List Items Addressed This Visit       Other   Chronic pain syndrome (Chronic)   Relevant Orders   POCT URINALYSIS DIP (CLINITEK)   Sickle Cell Panel   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    Dispense:  30  each    Refill:  5    Order Specific Question:   Supervising Provider    Answer:   Tresa Garter [[id number]]   voxelotor (OXBRYTA) 500 MG TABS tablet    Sig: Take 1,000 mg by mouth daily.    Dispense:  90 tablet    Refill:  1    Order Specific Question:   Supervising Provider    Answer:   Tresa Garter [id number]    Follow-up: Return in about 3 months (around 10/14/2021).    [name]  APRN, MSN, FNP-C [facility] [address] [place], [place] [address] [phone]

## 2021-07-19 ENCOUNTER — Other Ambulatory Visit: Payer: Self-pay | Admitting: Family Medicine

## 2021-07-19 DIAGNOSIS — D571 Sickle-cell disease without crisis: Secondary | ICD-10-CM

## 2021-07-19 LAB — NUSWAB VAGINITIS PLUS (VG+)
Candida albicans, NAA: NEGATIVE
Candida glabrata, NAA: NEGATIVE
Chlamydia trachomatis, NAA: NEGATIVE
Neisseria gonorrhoeae, NAA: NEGATIVE
Trich vag by NAA: NEGATIVE

## 2021-07-24 LAB — CMP14+CBC/D/PLT+FER+RETIC+V...
ALT: 19 IU/L (ref 0–32)
AST: 36 IU/L (ref 0–40)
Albumin/Globulin Ratio: 1.6 (ref 1.2–2.2)
Albumin: 4.5 g/dL (ref 3.8–4.8)
Alkaline Phosphatase: 66 IU/L (ref 44–121)
BUN/Creatinine Ratio: 18 (ref 9–23)
BUN: 9 mg/dL (ref 6–24)
Basophils Absolute: 0.2 10*3/uL (ref 0.0–0.2)
Basos: 1 %
Bilirubin Total: 3.2 mg/dL — ABNORMAL HIGH (ref 0.0–1.2)
CO2: 22 mmol/L (ref 20–29)
Calcium: 9.7 mg/dL (ref 8.7–10.2)
Chloride: 103 mmol/L (ref 96–106)
Creatinine, Ser: 0.51 mg/dL — ABNORMAL LOW (ref 0.57–1.00)
EOS (ABSOLUTE): 0.7 10*3/uL — ABNORMAL HIGH (ref 0.0–0.4)
Eos: 5 %
Ferritin: 2294 ng/mL — ABNORMAL HIGH (ref 15–150)
Globulin, Total: 2.9 g/dL (ref 1.5–4.5)
Glucose: 99 mg/dL (ref 70–99)
Hematocrit: 20.6 % — ABNORMAL LOW (ref 34.0–46.6)
Hemoglobin: 7.2 g/dL — ABNORMAL LOW (ref 11.1–15.9)
Immature Grans (Abs): 0.2 10*3/uL — ABNORMAL HIGH (ref 0.0–0.1)
Immature Granulocytes: 1 %
Lymphocytes Absolute: 2.9 10*3/uL (ref 0.7–3.1)
Lymphs: 19 %
MCH: 30 pg (ref 26.6–33.0)
MCHC: 35 g/dL (ref 31.5–35.7)
MCV: 86 fL (ref 79–97)
Monocytes Absolute: 1.8 10*3/uL — ABNORMAL HIGH (ref 0.1–0.9)
Monocytes: 12 %
NRBC: 4 % — ABNORMAL HIGH (ref 0–0)
Neutrophils Absolute: 9 10*3/uL — ABNORMAL HIGH (ref 1.4–7.0)
Neutrophils: 62 %
Platelets: 419 10*3/uL (ref 150–450)
Potassium: 4.3 mmol/L (ref 3.5–5.2)
RBC: 2.4 x10E6/uL — CL (ref 3.77–5.28)
RDW: 18.3 % — ABNORMAL HIGH (ref 11.7–15.4)
Retic Ct Pct: 16.7 % — ABNORMAL HIGH (ref 0.6–2.6)
Sodium: 138 mmol/L (ref 134–144)
Total Protein: 7.4 g/dL (ref 6.0–8.5)
Vit D, 25-Hydroxy: 29.1 ng/mL — ABNORMAL LOW (ref 30.0–100.0)
WBC: 14.8 10*3/uL — ABNORMAL HIGH (ref 3.4–10.8)
eGFR: 114 mL/min/{1.73_m2} (ref >59)

## 2021-07-25 ENCOUNTER — Other Ambulatory Visit: Payer: Self-pay

## 2021-07-25 ENCOUNTER — Ambulatory Visit (INDEPENDENT_AMBULATORY_CARE_PROVIDER_SITE_OTHER): Payer: Medicaid Other | Admitting: Plastic Surgery

## 2021-07-25 ENCOUNTER — Telehealth: Payer: Self-pay

## 2021-07-25 VITALS — BP 143/71 | HR 85 | Ht 67.0 in | Wt 131.8 lb

## 2021-07-25 DIAGNOSIS — S81801A Unspecified open wound, right lower leg, initial encounter: Secondary | ICD-10-CM

## 2021-07-25 NOTE — Progress Notes (Signed)
? ?Referring Provider ?[name], FNP ?[address] ?[address] ?[place],  [place] [address]  ? ?CC:  ?Chief Complaint  ?Patient presents with  ? Advice Only  ?   ? ?[name] is an 51 y.o. female.  ?HPI: Patient presents for right ankle wounds that been present for quite some time.  She has been treated at the wound center within the office debridements but they sent her for consideration for operative debridement.  Patient is seen vascular and has had lower extremity studies done which have shown no venous reflux in good arterial perfusion.  She has had operative debridement in the past with what sounds like skin grafts and perhaps TheraSkin treatments none of which have worked for her.  I asked her if she has seen a dermatologist in the past and she believes she has but cannot remember with certainty. ? ?No Known Allergies ? ?Outpatient Encounter Medications as of 07/25/2021  ?Medication Sig  ? aspirin EC 81 MG tablet Take 1 tablet (81 mg total) by mouth daily.  ? feeding supplement (ENSURE ENLIVE / ENSURE PLUS) LIQD Take 237 mLs by mouth 2 (two) times daily between meals.  ? folic acid (FOLVITE) 1 MG tablet Take 5 tablets (5 mg total) by mouth daily. (Patient taking differently: Take 1 mg by mouth daily.)  ? gabapentin (NEURONTIN) 300 MG capsule TAKE 1 CAPSULE(300 MG) BY MOUTH THREE TIMES DAILY  ? ibuprofen (ADVIL) 600 MG tablet Take 1 tablet (600 mg total) by mouth every 6 (six) hours as needed for mild pain.  ? nutrition supplement, JUVEN, (JUVEN) PACK Take 1 packet by mouth 2 (two) times daily between meals.  ? Oxycodone HCl 10 MG TABS Take 1 tablet (10 mg total) by mouth every 4 (four) hours while awake.  ? voxelotor (OXBRYTA) 500 MG TABS tablet Take 1,000 mg by mouth daily.  ? Deferiprone 500 MG TABS Take 500 mg by mouth daily at 12 noon. (Patient not taking: Reported on 07/25/2021)  ? ?No facility-administered encounter medications on file as of 07/25/2021.  ?  ? ?Past Medical History:  ?Diagnosis Date   ? Anemia 03/30/2012  ? Hx of sickle cell disease  ? CAP (community acquired pneumonia)   ? 2014  ? Cholelithiasis 03/30/2012  ? Chronic wound of extremity 04/01/2012  ? Elevated LFTs   ? Leg ulcer ([place]) 10/27/2012  ? Chronic under care of wound clinic  ? Multiple open wounds of lower extremity   ? chronic wounds B/LLE  ? Sickle cell disease ([place])   ? Sinus bradycardia by electrocardiogram 04/03/2012  ? ? ?Past Surgical History:  ?Procedure Laterality Date  ? ALLOGRAFT APPLICATION Right 1/75/1025  ? Procedure: SURGICAL PREP FOR GRAFTING RIGHT LOWER EXTREMITY AND APPLICATION OF Jannifer Hick;  Surgeon: Irene Limbo, MD;  Location: [facility];  Service: Plastics;  Laterality: Right;  ? APPLICATION OF A-CELL OF EXTREMITY Right 10/09/2015  ? Procedure: APPLICATION OF Jannifer Hick;  Surgeon: Irene Limbo, MD;  Location: [facility];  Service: Plastics;  Laterality: Right;  ? BREAST SURGERY    ? left breast cyst aspiration  ? CESAREAN SECTION    ? CESAREAN SECTION N/A 01/06/2014  ? Procedure: CESAREAN SECTION;  Surgeon: Emily Filbert, MD;  Location: [facility];  Service: Obstetrics;  Laterality: N/A;  ? CESAREAN SECTION N/A 05/04/2017  ? Procedure: CESAREAN SECTION;  Surgeon: Woodroe Mode, MD;  Location: [facility];  Service: Obstetrics;  Laterality: N/A;  ? I & D  EXTREMITY Right 10/09/2015  ? Procedure: SURGICAL PREPARATION FOR GRAFTING RIGHT ANKLE AND APPLICATION THERASKIN;  Surgeon: Irene Limbo, MD;  Location: [facility];  Service: Plastics;  Laterality: Right;  ? SKIN FULL THICKNESS GRAFT Bilateral 06/17/2016  ? Procedure: SURGICAL PREP FOR GRAFTING, BILATERAL LOWER EXTREMITIES AND APPLICATION OF Jannifer Hick;  Surgeon: Irene Limbo, MD;  Location: [facility];  Service: Plastics;  Laterality: Bilateral;  ? TONSILLECTOMY    ? ? ?Family History  ?Problem Relation Age of Onset  ? Sickle cell anemia Sister   ? Diabetes Mother   ? Asthma Mother   ? Sickle cell trait Mother   ?  Sickle cell trait Father   ? Hypertension Father   ? ? ?Social History  ? ?Social History Narrative  ? Lives with husband.  ?  ? ?Review of Systems ?General: Denies fevers, chills, weight loss ?CV: Denies chest pain, shortness of breath, palpitations ? ?Physical Exam ?Vitals with BMI 07/25/2021 07/17/2021 06/29/2021  ?Height '5\' 7"'$  '5\' 7"'$  '5\' 7"'$   ?Weight 131 lbs 13 oz 131 lbs 3 oz -  ?BMI 20.64 20.54 -  ?Systolic 597 416 384  ?Diastolic 71 62 73  ?Pulse 85 82 70  ?  ?General:  No acute distress,  Alert and oriented, Non-Toxic, Normal speech and affect ?Right lower extremity: She has a palpable dorsalis pedis pulse.  She has wounds in the around the medial and lateral malleolus that show mostly fibrinous debris at the base.  Rolled edges with surrounding scarring.  No obvious purulence erythema or signs of infection. ? ?Assessment/Plan ?Patient presents with chronic wounds of the right ankle.  We discussed a number of options.  I did bring up to her the possibility that this was pyoderma.  I think it would be reasonable particularly given the failure of all other treatments to refer her to dermatology for evaluation for that.  If it turns out that that is the case operative debridements would not be helpful.  We will plan to refer to dermatology and follow-up on the results of that.  If there is no concern for pyoderma then we might consider operative debridement. ? ?Cindra Presume ?07/25/2021, 3:22 PM  ? ? ?  ?

## 2021-07-26 ENCOUNTER — Encounter (HOSPITAL_BASED_OUTPATIENT_CLINIC_OR_DEPARTMENT_OTHER): Payer: Medicaid Other | Attending: General Surgery | Admitting: General Surgery

## 2021-07-26 ENCOUNTER — Telehealth: Payer: Self-pay

## 2021-07-26 ENCOUNTER — Other Ambulatory Visit: Payer: Self-pay

## 2021-07-26 DIAGNOSIS — I82531 Chronic embolism and thrombosis of right popliteal vein: Secondary | ICD-10-CM | POA: Insufficient documentation

## 2021-07-26 DIAGNOSIS — D571 Sickle-cell disease without crisis: Secondary | ICD-10-CM | POA: Diagnosis not present

## 2021-07-26 DIAGNOSIS — L97828 Non-pressure chronic ulcer of other part of left lower leg with other specified severity: Secondary | ICD-10-CM | POA: Insufficient documentation

## 2021-07-26 DIAGNOSIS — L97818 Non-pressure chronic ulcer of other part of right lower leg with other specified severity: Secondary | ICD-10-CM | POA: Insufficient documentation

## 2021-07-26 NOTE — Progress Notes (Signed)
[name], [name] ([id number]) Visit Report for 07/26/2021 Arrival Information Details Patient Name: Date of Service: [name] [name] 07/26/2021 10:00 A M Medical Record Number: [id number] Patient Account Number: [id number] Date of Birth/Sex: Treating RN: 1970-10-24 (51 y.o. Tonita Phoenix, Lauren Primary Care Ronin Rehfeldt: [name] Other Clinician: Referring Damonique Brunelle: Treating Astryd Pearcy/Extender: Elyse Jarvis Weeks in Treatment: 14 Visit Information History Since Last Visit Added or deleted any medications: No Patient Arrived: Wheel Chair Any new allergies or adverse reactions: No Arrival Time: 09:48 Had a fall or experienced change in No Accompanied By: self activities of daily living that may affect Transfer Assistance: Manual risk of falls: Patient Identification Verified: Yes Signs or symptoms of abuse/neglect since last visito No Secondary Verification Process Completed: Yes Hospitalized since last visit: No Patient Requires Transmission-Based Precautions: No Implantable device outside of the clinic excluding No Patient Has Alerts: No cellular tissue based products placed in the center since last visit: Has Dressing in Place as Prescribed: Yes Pain Present Now: Yes Electronic Signature(s) Signed: 07/26/2021 5:18:04 PM By: Rhae Hammock RN Entered By: Rhae Hammock on 07/26/2021 09:48:24 -------------------------------------------------------------------------------- Encounter Discharge Information Details Patient Name: Date of Service: [name], [name] 07/26/2021 10:00 A M Medical Record Number: [id number] Patient Account Number: [id number] Date of Birth/Sex: Treating RN: 1970-06-21 (51 y.o. Tonita Phoenix, Lauren Primary Care Katilin Raynes: [name] Other Clinician: Referring Radford Pease: Treating Zhi Geier/Extender: Elyse Jarvis Weeks in Treatment: 14 Encounter Discharge Information Items Post Procedure Vitals Discharge  Condition: Stable Temperature (F): 98.7 Ambulatory Status: Wheelchair Pulse (bpm): 74 Discharge Destination: Home Respiratory Rate (breaths/min): 17 Transportation: Private Auto Blood Pressure (mmHg): 132/70 Accompanied By: self Schedule Follow-up Appointment: Yes Clinical Summary of Care: Patient Declined Electronic Signature(s) Signed: 07/26/2021 5:18:04 PM By: Rhae Hammock RN Entered By: Rhae Hammock on 07/26/2021 10:29:37 -------------------------------------------------------------------------------- Lower Extremity Assessment Details Patient Name: Date of Service: [name], [name] 07/26/2021 10:00 A M Medical Record Number: [id number] Patient Account Number: [id number] Date of Birth/Sex: Treating RN: 13-Sep-1970 (50 y.o. Tonita Phoenix, Lauren Primary Care Adryana Mogensen: [name] Other Clinician: Referring Keane Martelli: Treating Sylar Voong/Extender: Elyse Jarvis Weeks in Treatment: 14 Edema Assessment Assessed: Shirlyn Goltz: Yes] Patrice Paradise: Yes] Edema: [Left: No] [Right: No] Calf Left: Right: Point of Measurement: 33 cm From Medial Instep 27 cm 28.5 cm Ankle Left: Right: Point of Measurement: 10 cm From Medial Instep 16 cm 22 cm Vascular Assessment Pulses: Dorsalis Pedis Palpable: [Left:Yes] [Right:Yes] Posterior Tibial Palpable: [Left:Yes] [Right:Yes] Electronic Signature(s) Signed: 07/26/2021 5:18:04 PM By: Rhae Hammock RN Entered By: Rhae Hammock on 07/26/2021 09:54:01 -------------------------------------------------------------------------------- Multi Wound Chart Details Patient Name: Date of Service: [name], [name] 07/26/2021 10:00 A M Medical Record Number: [id number] Patient Account Number: [id number] Date of Birth/Sex: Treating RN: 09/02/1970 (51 y.o. F) Primary Care Makoa Satz: [name] Other Clinician: Referring Chaylee Ehrsam: Treating Joseh Sjogren/Extender: Elyse Jarvis Weeks in Treatment: 14 Vital  Signs Height(in): 67 Pulse(bpm): 75 Weight(lbs): Blood Pressure(mmHg): 134/74 Body Mass Index(BMI): Temperature(F): 98.7 Respiratory Rate(breaths/min): 17 Photos: [17:Right, Lateral Lower Leg] [18:Left, Posterior Ankle] [19:Left, Medial Ankle] Wound Location: [17:Gradually Appeared] [18:Gradually Appeared] [19:Gradually Appeared] Wounding Event: [17:Sickle Cell Lesion] [18:Sickle Cell Lesion] [19:Sickle Cell Lesion] Primary Etiology: [17:N/A] [18:N/A] [19:N/A] Secondary Etiology: [17:Anemia, Sickle Cell Disease,] [18:Anemia, Sickle Cell Disease,] [19:Anemia, Sickle Cell Disease,] Comorbid History: [17:Neuropathy 10/05/2012] [18:Neuropathy 12/25/2020] [19:Neuropathy 12/25/2020] Date Acquired: [17:14] [18:14] [19:14] Weeks of Treatment: [17:Open] [18:Open] [19:Open] Wound Status: [17:No] [18:No] [19:No] Wound Recurrence: [17:Yes] [18:No] [19:No] Clustered Wound: [17:9.5x5.5x0.1] [18:0.4x0.4x0.1] [19:1.8x1x0.1] Measurements L  x W x D (cm) [17:41.037] [18:0.126] [19:1.414] A (cm) : rea [17:4.104] [18:0.013] [19:0.141] Volume (cm) : [17:78.20%] [18:99.10%] [19:93.30%] % Reduction in A [17:rea: 78.20%] [18:99.70%] [19:99.10%] % Reduction in Volume: [17:Full Thickness Without Exposed] [18:Full Thickness Without Exposed] [19:Full Thickness Without Exposed] Classification: [17:Support Structures Large] [18:Support Structures Medium] [19:Support Structures Medium] Exudate A mount: [17:Purulent] [18:Serosanguineous] [19:Serosanguineous] Exudate Type: [17:yellow, brown, green] [18:red, brown] [19:red, brown] Exudate Color: [17:N/A] [18:Flat and Intact] [19:Flat and Intact] Wound Margin: [17:Small (1-33%)] [18:Large (67-100%)] [19:Large (67-100%)] Granulation A mount: [17:Pink] [18:Red, Pink] [19:Red] Granulation Quality: [17:Large (67-100%)] [18:None Present (0%)] [19:Small (1-33%)] Necrotic A mount: [17:Fat Layer (Subcutaneous Tissue): Yes Fat Layer (Subcutaneous Tissue): Yes Fat Layer  (Subcutaneous Tissue): Yes] Exposed Structures: [17:Fascia: No Tendon: No Muscle: No Joint: No Bone: No Small (1-33%)] [18:Fascia: No Tendon: No Muscle: No Joint: No Bone: No Small (1-33%)] [19:Fascia: No Tendon: No Muscle: No Joint: No Bone: No Small (1-33%)] Epithelialization: [17:Debridement - Excisional] [18:Debridement - Excisional] [19:Debridement - Excisional] Debridement: Pre-procedure Verification/Time Out 10:15 [18:10:15] [19:10:15] Taken: [17:Lidocaine] [18:Lidocaine] [19:Lidocaine] Pain Control: [17:Subcutaneous, Slough] [18:Subcutaneous, Slough] [19:Subcutaneous, Slough] Tissue Debrided: [17:Skin/Subcutaneous Tissue] [18:Skin/Subcutaneous Tissue] [19:Skin/Subcutaneous Tissue] Level: [17:30] [18:0.16] [19:1.8] Debridement A (sq cm): [17:rea Curette] [18:Curette] [19:Curette] Instrument: [17:Minimum] [18:Minimum] [19:Minimum] Bleeding: [17:Pressure] [18:Pressure] [19:Pressure] Hemostasis A chieved: [17:0] [18:0] [19:0] Procedural Pain: [17:0] [18:0] [19:0] Post Procedural Pain: [17:Procedure was tolerated well] [18:Procedure was tolerated well] [19:Procedure was tolerated well] Debridement Treatment Response: [17:9.5x5.5x0.1] [18:0.4x0.4x0.1] [19:1.9x1x0.1] Post Debridement Measurements L x W x D (cm) [17:4.104] [18:0.013] [19:0.149] Post Debridement Volume: (cm) [17:Debridement] [18:Debridement] [19:Debridement] Wound Number: 21 N/A N/A Photos: N/A N/A Right, Medial Ankle N/A N/A Wound Location: Gradually Appeared N/A N/A Wounding Event: Sickle Cell Lesion N/A N/A Primary Etiology: Venous Leg Ulcer N/A N/A Secondary Etiology: Anemia, Sickle Cell Disease, N/A N/A Comorbid History: Neuropathy 06/26/2021 N/A N/A Date Acquired: 4 N/A N/A Weeks of Treatment: Open N/A N/A Wound Status: No N/A N/A Wound Recurrence: No N/A N/A Clustered Wound: 6.5x3.2x0.1 N/A N/A Measurements L x W x D (cm) 16.336 N/A N/A A (cm) : rea 1.634 N/A N/A Volume (cm) : 43.60% N/A  N/A % Reduction in Area: 43.60% N/A N/A % Reduction in Volume: Full Thickness Without Exposed N/A N/A Classification: Support Structures Large N/A N/A Exudate Amount: Purulent N/A N/A Exudate Type: yellow, brown, green N/A N/A Exudate Color: Distinct, outline attached N/A N/A Wound Margin: Medium (34-66%) N/A N/A Granulation Amount: Red N/A N/A Granulation Quality: Medium (34-66%) N/A N/A Necrotic Amount: Fat Layer (Subcutaneous Tissue): Yes N/A N/A Exposed Structures: Fascia: No Tendon: No Muscle: No Joint: No Bone: No Small (1-33%) N/A N/A Epithelialization: Debridement - Excisional N/A N/A Debridement: Pre-procedure Verification/Time Out 10:15 N/A N/A Taken: Lidocaine N/A N/A Pain Control: Subcutaneous, Slough N/A N/A Tissue Debrided: Skin/Subcutaneous Tissue N/A N/A Level: 10 N/A N/A Debridement A (sq cm): rea Curette N/A N/A Instrument: Minimum N/A N/A Bleeding: Pressure N/A N/A Hemostasis A chieved: 0 N/A N/A Procedural Pain: 0 N/A N/A Post Procedural Pain: Procedure was tolerated well N/A N/A Debridement Treatment Response: 6.5x3.2x0.1 N/A N/A Post Debridement Measurements L x W x D (cm) 1.634 N/A N/A Post Debridement Volume: (cm) Debridement N/A N/A Procedures Performed: Treatment Notes Electronic Signature(s) Signed: 07/26/2021 10:27:09 AM By: Fredirick Maudlin MD FACS Entered By: Fredirick Maudlin on 07/26/2021 10:27:08 -------------------------------------------------------------------------------- Multi-Disciplinary Care Plan Details Patient Name: Date of Service: [name], [name] 07/26/2021 10:00 A M Medical Record Number: [id number] Patient Account Number: [id number] Date of Birth/Sex: Treating RN: 02-23-1971 (51 y.o.  Tonita Phoenix, Lauren Primary Care Stillman Buenger: [name] Other Clinician: Referring Lelan Cush: Treating Rishab Stoudt/Extender: Elyse Jarvis Weeks in Treatment: 14 Multidisciplinary Care Plan  reviewed with physician Active Inactive Venous Leg Ulcer Nursing Diagnoses: Actual venous Insuffiency (use after diagnosis is confirmed) Knowledge deficit related to disease process and management Goals: Patient will maintain optimal edema control Date Initiated: 06/26/2021 Target Resolution Date: 07/24/2021 Goal Status: Active Interventions: Assess peripheral edema status every visit. Compression as ordered Treatment Activities: Therapeutic compression applied : 06/26/2021 Notes: Wound/Skin Impairment Nursing Diagnoses: Impaired tissue integrity Goals: Patient/caregiver will verbalize understanding of skin care regimen Date Initiated: 04/17/2021 Target Resolution Date: 07/24/2021 Goal Status: Active Ulcer/skin breakdown will have a volume reduction of 30% by week 4 Date Initiated: 04/17/2021 Date Inactivated: 05/29/2021 Target Resolution Date: 05/15/2021 Goal Status: Met Ulcer/skin breakdown will have a volume reduction of 50% by week 8 Date Initiated: 05/29/2021 Date Inactivated: 06/26/2021 Target Resolution Date: 06/26/2021 Goal Status: Unmet Unmet Reason: venous reflux Ulcer/skin breakdown will have a volume reduction of 80% by week 12 Date Initiated: 06/26/2021 Target Resolution Date: 07/24/2021 Goal Status: Active Interventions: Assess patient/caregiver ability to obtain necessary supplies Assess patient/caregiver ability to perform ulcer/skin care regimen upon admission and as needed Assess ulceration(s) every visit Provide education on ulcer and skin care Treatment Activities: Topical wound management initiated : 04/17/2021 Notes: 06/08/21: Left leg wounds greater than 30% volume reduction, right leg acute infection. Electronic Signature(s) Signed: 07/26/2021 5:18:04 PM By: Rhae Hammock RN Entered By: Rhae Hammock on 07/26/2021 10:26:52 -------------------------------------------------------------------------------- Pain Assessment Details Patient Name: Date of  Service: [name], [name] 07/26/2021 10:00 A M Medical Record Number: [id number] Patient Account Number: [id number] Date of Birth/Sex: Treating RN: 11/07/1970 (51 y.o. Tonita Phoenix, Lauren Primary Care Medford Staheli: [name] Other Clinician: Referring Ronson Hagins: Treating Phoua Hoadley/Extender: Elyse Jarvis Weeks in Treatment: 14 Active Problems Location of Pain Severity and Description of Pain Patient Has Paino Yes Site Locations Pain Location: Generalized Pain, Pain in Ulcers With Dressing Change: Yes Duration of the Pain. Constant / Intermittento Intermittent Rate the pain. Current Pain Level: 3 Worst Pain Level: 10 Least Pain Level: 0 Tolerable Pain Level: 3 Character of Pain Describe the Pain: Aching Pain Management and Medication Current Pain Management: Medication: No Cold Application: No Rest: No Massage: No Activity: No T.E.N.S.: No Heat Application: No Leg drop or elevation: No Is the Current Pain Management Adequate: Adequate How does your wound impact your activities of daily livingo Sleep: No Bathing: No Appetite: No Relationship With Others: No Bladder Continence: No Emotions: No Bowel Continence: No Work: No Toileting: No Drive: No Dressing: No Hobbies: No Electronic Signature(s) Signed: 07/26/2021 5:18:04 PM By: Rhae Hammock RN Entered By: Rhae Hammock on 07/26/2021 09:49:10 -------------------------------------------------------------------------------- Patient/Caregiver Education Details Patient Name: Date of Service: [name], [name] 3/9/2023andnbsp10:00 [place] Record Number: [id number] Patient Account Number: [id number] Date of Birth/Gender: Treating RN: 03-12-1971 (51 y.o. Tonita Phoenix, Lauren Primary Care Physician: [name] Other Clinician: Referring Physician: Treating Physician/Extender: Elyse Jarvis Weeks in Treatment: 14 Education Assessment Education Provided  To: Patient Education Topics Provided Wound/Skin Impairment: Methods: Explain/Verbal Responses: Reinforcements needed, State content correctly Electronic Signature(s) Signed: 07/26/2021 5:18:04 PM By: Rhae Hammock RN Entered By: Rhae Hammock on 07/26/2021 10:27:42 -------------------------------------------------------------------------------- Wound Assessment Details Patient Name: Date of Service: [name], [name] 07/26/2021 10:00 [place] Record Number: [id number] Patient Account Number: [id number] Date of Birth/Sex: Treating RN: [birth year] (51 y.o. Benjaman Lobe Primary Care Kymia Simi:  [name] Other Clinician: Referring Kamonte Mcmichen: Treating Random Dobrowski/Extender: Elyse Jarvis Weeks in Treatment: 14 Wound Status Wound Number: 17 Primary Etiology: Sickle Cell Lesion Wound Location: Right, Lateral Lower Leg Wound Status: Open Wounding Event: Gradually Appeared Comorbid History: Anemia, Sickle Cell Disease, Neuropathy Date Acquired: 10/05/2012 Weeks Of Treatment: 14 Clustered Wound: Yes Photos Wound Measurements Length: (cm) 9.5 Width: (cm) 5.5 Depth: (cm) 0.1 Area: (cm) 41.037 Volume: (cm) 4.104 % Reduction in Area: 78.2% % Reduction in Volume: 78.2% Epithelialization: Small (1-33%) Tunneling: No Undermining: No Wound Description Classification: Full Thickness Without Exposed Support Structures Exudate Amount: Large Exudate Type: Purulent Exudate Color: yellow, brown, green Foul Odor After Cleansing: No Slough/Fibrino Yes Wound Bed Granulation Amount: Small (1-33%) Exposed Structure Granulation Quality: Pink Fascia Exposed: No Necrotic Amount: Large (67-100%) Fat Layer (Subcutaneous Tissue) Exposed: Yes Necrotic Quality: Adherent Slough Tendon Exposed: No Muscle Exposed: No Joint Exposed: No Bone Exposed: No Treatment Notes Wound #17 (Lower Leg) Wound Laterality: Right, Lateral Cleanser Soap and Water Discharge  Instruction: May shower and wash wound with dial antibacterial soap and water prior to dressing change. Wound Cleanser Discharge Instruction: Cleanse the wound with wound cleanser prior to applying a clean dressing using gauze sponges, not tissue or cotton balls. Peri-Wound Care Triamcinolone 15 (g) Discharge Instruction: Use triamcinolone 15 (g) as directed Sween Lotion (Moisturizing lotion) Discharge Instruction: Apply moisturizing lotion as directed Topical Primary Dressing KerraCel Ag Gelling Fiber Dressing, 4x5 in (silver alginate) Discharge Instruction: Apply silver alginate to wound bed as instructed Santyl Ointment Discharge Instruction: Apply nickel thick amount to wound bed as instructed Secondary Dressing ABD Pad, 5x9 Discharge Instruction: Apply over primary dressing as directed. Zetuvit Plus 4x8 in Discharge Instruction: Apply over primary dressing as directed. Secured With Compression Wrap Kerlix Roll 4.5x3.1 (in/yd) Discharge Instruction: Apply Kerlix and Coban compression as directed. Coban Self-Adherent Wrap 4x5 (in/yd) Discharge Instruction: Apply over Kerlix as directed. Compression Stockings Add-Ons Electronic Signature(s) Signed: 07/26/2021 5:18:04 PM By: Rhae Hammock RN Entered By: Rhae Hammock on 07/26/2021 10:05:07 -------------------------------------------------------------------------------- Wound Assessment Details Patient Name: Date of Service: [name], [name] 07/26/2021 10:00 A M Medical Record Number: [id number] Patient Account Number: [id number] Date of Birth/Sex: Treating RN: June 27, 1970 (51 y.o. Tonita Phoenix, Lauren Primary Care Trust Crago: [name] Other Clinician: Referring Eyan Hagood: Treating Rowan Pollman/Extender: Elyse Jarvis Weeks in Treatment: 14 Wound Status Wound Number: 18 Primary Etiology: Sickle Cell Lesion Wound Location: Left, Posterior Ankle Wound Status: Open Wounding Event: Gradually  Appeared Comorbid History: Anemia, Sickle Cell Disease, Neuropathy Date Acquired: 12/25/2020 Weeks Of Treatment: 14 Clustered Wound: No Photos Wound Measurements Length: (cm) 0.4 Width: (cm) 0.4 Depth: (cm) 0.1 Area: (cm) 0.126 Volume: (cm) 0.013 % Reduction in Area: 99.1% % Reduction in Volume: 99.7% Epithelialization: Small (1-33%) Tunneling: No Undermining: No Wound Description Classification: Full Thickness Without Exposed Support Structures Wound Margin: Flat and Intact Exudate Amount: Medium Exudate Type: Serosanguineous Exudate Color: red, brown Foul Odor After Cleansing: No Slough/Fibrino Yes Wound Bed Granulation Amount: Large (67-100%) Exposed Structure Granulation Quality: Red, Pink Fascia Exposed: No Necrotic Amount: None Present (0%) Fat Layer (Subcutaneous Tissue) Exposed: Yes Tendon Exposed: No Muscle Exposed: No Joint Exposed: No Bone Exposed: No Treatment Notes Wound #18 (Ankle) Wound Laterality: Left, Posterior Cleanser Soap and Water Discharge Instruction: May shower and wash wound with dial antibacterial soap and water prior to dressing change. Wound Cleanser Discharge Instruction: Cleanse the wound with wound cleanser prior to applying a clean dressing using gauze sponges, not tissue or cotton  balls. Peri-Wound Care Triamcinolone 15 (g) Discharge Instruction: Use triamcinolone 15 (g) as directed Sween Lotion (Moisturizing lotion) Discharge Instruction: Apply moisturizing lotion as directed Topical Primary Dressing KerraCel Ag Gelling Fiber Dressing, 4x5 in (silver alginate) Discharge Instruction: Apply silver alginate to wound bed as instructed Secondary Dressing Woven Gauze Sponge, Non-Sterile 4x4 in Discharge Instruction: Apply over primary dressing as directed. Secured With Compression Wrap Kerlix Roll 4.5x3.1 (in/yd) Discharge Instruction: Apply Kerlix and Coban compression as directed. Coban Self-Adherent Wrap 4x5 (in/yd) Discharge  Instruction: Apply over Kerlix as directed. Compression Stockings Add-Ons Electronic Signature(s) Signed: 07/26/2021 5:18:04 PM By: Rhae Hammock RN Entered By: Rhae Hammock on 07/26/2021 10:06:04 -------------------------------------------------------------------------------- Wound Assessment Details Patient Name: Date of Service: [name], [name] 07/26/2021 10:00 A M Medical Record Number: [id number] Patient Account Number: [id number] Date of Birth/Sex: Treating RN: 18-Feb-1971 (51 y.o. Tonita Phoenix, Lauren Primary Care Malayasia Mirkin: [name] Other Clinician: Referring Mizraim Harmening: Treating Shalayah Beagley/Extender: Elyse Jarvis Weeks in Treatment: 14 Wound Status Wound Number: 19 Primary Etiology: Sickle Cell Lesion Wound Location: Left, Medial Ankle Wound Status: Open Wounding Event: Gradually Appeared Comorbid History: Anemia, Sickle Cell Disease, Neuropathy Date Acquired: 12/25/2020 Weeks Of Treatment: 14 Clustered Wound: No Photos Wound Measurements Length: (cm) 1.8 Width: (cm) 1 Depth: (cm) 0.1 Area: (cm) 1.414 Volume: (cm) 0.141 % Reduction in Area: 93.3% % Reduction in Volume: 99.1% Epithelialization: Small (1-33%) Tunneling: No Undermining: No Wound Description Classification: Full Thickness Without Exposed Support Structures Wound Margin: Flat and Intact Exudate Amount: Medium Exudate Type: Serosanguineous Exudate Color: red, brown Foul Odor After Cleansing: No Slough/Fibrino Yes Wound Bed Granulation Amount: Large (67-100%) Exposed Structure Granulation Quality: Red Fascia Exposed: No Necrotic Amount: Small (1-33%) Fat Layer (Subcutaneous Tissue) Exposed: Yes Necrotic Quality: Adherent Slough Tendon Exposed: No Muscle Exposed: No Joint Exposed: No Bone Exposed: No Treatment Notes Wound #19 (Ankle) Wound Laterality: Left, Medial Cleanser Soap and Water Discharge Instruction: May shower and wash wound with dial  antibacterial soap and water prior to dressing change. Wound Cleanser Discharge Instruction: Cleanse the wound with wound cleanser prior to applying a clean dressing using gauze sponges, not tissue or cotton balls. Peri-Wound Care Triamcinolone 15 (g) Discharge Instruction: Use triamcinolone 15 (g) as directed Sween Lotion (Moisturizing lotion) Discharge Instruction: Apply moisturizing lotion as directed Topical Primary Dressing KerraCel Ag Gelling Fiber Dressing, 4x5 in (silver alginate) Discharge Instruction: Apply silver alginate to wound bed as instructed Santyl Ointment Discharge Instruction: Apply nickel thick amount to wound bed as instructed Secondary Dressing ABD Pad, 5x9 Discharge Instruction: Apply over primary dressing as directed. Secured With Compression Wrap Kerlix Roll 4.5x3.1 (in/yd) Discharge Instruction: Apply Kerlix and Coban compression as directed. Coban Self-Adherent Wrap 4x5 (in/yd) Discharge Instruction: Apply over Kerlix as directed. Compression Stockings Add-Ons Electronic Signature(s) Signed: 07/26/2021 5:18:04 PM By: Rhae Hammock RN Entered By: Rhae Hammock on 07/26/2021 10:06:39 -------------------------------------------------------------------------------- Wound Assessment Details Patient Name: Date of Service: [name], [name] 07/26/2021 10:00 A M Medical Record Number: [id number] Patient Account Number: [id number] Date of Birth/Sex: Treating RN: 1970-08-26 (51 y.o. Tonita Phoenix, Lauren Primary Care Imad Shostak: [name] Other Clinician: Referring Miara Emminger: Treating Minnetta Sandora/Extender: Elyse Jarvis Weeks in Treatment: 14 Wound Status Wound Number: 21 Primary Etiology: Sickle Cell Lesion Wound Location: Right, Medial Ankle Secondary Etiology: Venous Leg Ulcer Wounding Event: Gradually Appeared Wound Status: Open Date Acquired: 06/26/2021 Comorbid History: Anemia, Sickle Cell Disease, Neuropathy Weeks Of  Treatment: 4 Clustered Wound: No Photos Wound Measurements Length: (cm) 6.5 Width: (cm) 3.2  Depth: (cm) 0.1 Area: (cm) 16.336 Volume: (cm) 1.634 % Reduction in Area: 43.6% % Reduction in Volume: 43.6% Epithelialization: Small (1-33%) Tunneling: No Undermining: No Wound Description Classification: Full Thickness Without Exposed Support Structures Wound Margin: Distinct, outline attached Exudate Amount: Large Exudate Type: Purulent Exudate Color: yellow, brown, green Foul Odor After Cleansing: No Slough/Fibrino Yes Wound Bed Granulation Amount: Medium (34-66%) Exposed Structure Granulation Quality: Red Fascia Exposed: No Necrotic Amount: Medium (34-66%) Fat Layer (Subcutaneous Tissue) Exposed: Yes Necrotic Quality: Adherent Slough Tendon Exposed: No Muscle Exposed: No Joint Exposed: No Bone Exposed: No Treatment Notes Wound #21 (Ankle) Wound Laterality: Right, Medial Cleanser Soap and Water Discharge Instruction: May shower and wash wound with dial antibacterial soap and water prior to dressing change. Wound Cleanser Discharge Instruction: Cleanse the wound with wound cleanser prior to applying a clean dressing using gauze sponges, not tissue or cotton balls. Peri-Wound Care Triamcinolone 15 (g) Discharge Instruction: Use triamcinolone 15 (g) as directed Sween Lotion (Moisturizing lotion) Discharge Instruction: Apply moisturizing lotion as directed Topical Primary Dressing KerraCel Ag Gelling Fiber Dressing, 4x5 in (silver alginate) Discharge Instruction: Apply silver alginate to wound bed as instructed Secondary Dressing ABD Pad, 5x9 Discharge Instruction: Apply over primary dressing as directed. Zetuvit Plus 4x8 in Discharge Instruction: Apply over primary dressing as directed. Secured With Compression Wrap Kerlix Roll 4.5x3.1 (in/yd) Discharge Instruction: Apply Kerlix and Coban compression as directed. Coban Self-Adherent Wrap 4x5 (in/yd) Discharge  Instruction: Apply over Kerlix as directed. Compression Stockings Add-Ons Electronic Signature(s) Signed: 07/26/2021 5:18:04 PM By: Rhae Hammock RN Entered By: Rhae Hammock on 07/26/2021 10:05:33 -------------------------------------------------------------------------------- Vitals Details Patient Name: Date of Service: [name], [name] 07/26/2021 10:00 A M Medical Record Number: [id number] Patient Account Number: [id number] Date of Birth/Sex: Treating RN: [birth year] (51 y.o. Tonita Phoenix, Lauren Primary Care Tyese Finken: [name] Other Clinician: Referring Holland Nickson: Treating Destry Bezdek/Extender: Elyse Jarvis Weeks in Treatment: 14 Vital Signs Time Taken: 09:48 Temperature (F): 98.7 Height (in): 67 Pulse (bpm): 75 Respiratory Rate (breaths/min): 17 Blood Pressure (mmHg): 134/74 Reference Range: 80 - 120 mg / dl Electronic Signature(s) Signed: 07/26/2021 5:18:04 PM By: Rhae Hammock RN Entered By: Rhae Hammock on 07/26/2021 09:48:45

## 2021-07-27 ENCOUNTER — Other Ambulatory Visit: Payer: Self-pay | Admitting: Family Medicine

## 2021-07-27 DIAGNOSIS — E43 Unspecified severe protein-calorie malnutrition: Secondary | ICD-10-CM

## 2021-07-27 MED ORDER — JUVEN PO POWD: 1.0000 | Freq: Three times a day (TID) | ORAL | 6 refills | Status: AC

## 2021-07-27 NOTE — Progress Notes (Signed)
Meds ordered this encounter  ?Medications  ? Nutritional Supplements (JUVEN) POWD  ?  Sig: Take 1 each by mouth 3 (three) times daily between meals.  ?  Dispense:  545 g  ?  Refill:  6  ?  Order Specific Question:   Supervising Provider  ?  AnswerTresa Garter [[id number]]  ? [name]  APRN, MSN, FNP-C ?[facility] [place] ?[facility] ?[address]  ?[place], [place] [address] ?[phone] ? ?

## 2021-07-27 NOTE — Telephone Encounter (Signed)
Patient is aware of medication information ?

## 2021-07-27 NOTE — Progress Notes (Signed)
[name] ([id number]) Visit Report for 07/17/2021 HPI Details Patient Name: Date of Service: [name] [name] 07/17/2021 11:45 A M Medical Record Number: [id number] Patient Account Number: [id number] Date of Birth/Sex: Treating RN: May 15, 1971 (51 y.o. F) Primary Care Provider: Cammie [name] Other Clinician: Referring Provider: Treating Provider/Extender: [name] in Treatment: 13 History of Present Illness Location: medial and lateral ankle region on the right and left medial malleolus Quality: Patient reports experiencing a shooting pain to affected area(s). Severity: Patient states wound(s) are getting worse. Duration: right lower extremity bimalleolar ulcers have been present for approximately 2 years; the rright meedial malleolus ulcer has been there proximally 6 months Timing: Pain in wound is constant (hurts all the time) Context: The wound would happen gradually ssociated Signs and Symptoms: Patient reports having increase discharge. 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 At that stage she was referred from the plastic surgery service to dermatology who treated her for a skin infection with doxycycline and then Levaquin and a local antibiotic ointment. I understand the patient has since developed ulceration on the left ankle both medial and lateral and was now referred back to the wound center as dermatology has finished the management. 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  She has been visiting the wound center [facility] and was seen by [name] and [name]. after prolonged conservator therapy between July 2016 and January 2017. She had been seen by the plastic surgeon and taken to the OR for debridement and application of [name]. She had 3 applications of [name] and was then treated with collagen. Prior to that she had a history of similar problems in 2014 and was treated conservatively. Had a reflux study done for the right lower extremity in August 2016 without reflux or DVT . Past medical history significant for [name] cell disease, anemia, leg ulcers, cholelithiasis,and has never been a [name]. Once the patient was discharged on the wound center she says within 2 or 3 [name] the problems recurred and she has been treating it conservatively. since I saw her 3 [name] ago at [facility] she has been unable to get her dressing material but has completed a course of doxycycline. 6/7/ 2017 -- lower extremity venous duplex reflux evaluation was done No evidence of SVT or DVT in the RLL. No venous incompetence in the RLL. No further vascular workup is indicated at this time. She was seen by [name], on 10/04/2015. She agreed with the plan of taking her to the OR for debridement and application of [name] and would also take biopsies to rule out pyoderma gangrenosum. Follow-up note dated May 31 received and she was status post application of [name] to multiple ulcers around the right ankle. Pathology did not show evidence of malignancy or pyoderma gangrenosum. She would continue to see as in the wound clinic for further care and see [name] as needed. The patient brought the biopsy report and it was consistent with stasis ulcer no evidence of malignancy and the comment  was that there was some adjacent neovascularization, fibrosis and patchy perivascular chronic inflammation. 11/15/2015 -- today we applied her first application of  [name] 11/30/15; [name] #2 12/13/2015 -- she is having a lot of pain locally and is here for possible application of a [name] today. 01/16/2016 -- the patient has significant pain and has noticed despite in spite of all local care and oral pain medication. It is impossible to debride her in the office. 02/06/2016 -- I do not see any notes from [name]( the patient has not made a call to the office know as she heard from them) and the only visit to recently was with her PCP Dr. Danella [name] -- I saw her on 01/16/2016 and prescribed 90 tablets of oxycodone 10 mg and did lab work and screening for HIV. the HIV was negative and hemoglobin was 6.3 with a WBC count of 14.9 and hematocrit of 17.8 with platelets of 561. reticulocyte count was 15.5% READMISSION: 07/10/2016- The patient is here for readmission for bilateral lower extremity ulcers in the presence of [name] cell. The bimalleolar ulcers to the right lower extremity have been present for approximately 2 years, the left medial malleolus ulcer has been present approximately 6 months. She has followed with [name] in the past and has had a total of 3 applications of [name] (01/2015, 09/2015, 06/17/16). She has also followed with [name] here in the clinic and has received 2 applications of [name] (11/10/15, 11/30/15). The patient does experience chronic, and is not amenable to debridement. She had a [name] cell crisis in December 2017, prior to that has been several years. She is not currently on any antibiotic therapy and has not been treated with any recently. 07/17/2016 -- was seen by [name] of plastic surgery who saw her 2 [name] postop application of [name] #3. She had removed her dressing and asked her to apply silver alginate on alternate days and follow-up back with the wound center. Future debridements and application of skin substitute would have to be done in the hospital due to her high risk for  anesthesia. READMISSION 04/17/2021 Patient is now a 51 year old woman that we have had in this clinic for a prolonged period of time and 2016-2017 and then again for 2 visits in February 2018. At that point she had wounds on the right lower leg predominantly medial. She had also been seen by plastic surgery [name] who I believe took her to the OR for operative debridement and application of [name] in 2017. After she left our clinic she was followed for a very prolonged period of time in the wound care center in [facility] who then referred her ultimately to [facility] where she was seen by Dr. Vernona Rieger. Again taken her to the OR for skin grafting which apparently did not take. She had multiple other attempts at dressings although I have not really looked over all of these notes in great detail. She has not been seen in a wound care center in about a year. She states over the last year in addition to her right lower leg she has developed wounds on the left lower leg quite extensive. She is using Xeroform to all of these wounds without really any improvement. She also has Medicaid which does not cover wound products. The patient has had vascular work-ups in the past including most recently on 03/28/2021 showing biphasic waveforms on the right triphasic at the PTA and biphasic at the dorsalis pedis on the left. She was unable  to tolerate any degree of compression to do ABIs. Unfortunately TBI's were also not done. She had venous reflux studies done in 2017. This did not show any evidence of a DVT or SVT and no venous incompetence was noted in the right leg at the time this was the only side with the wound As noted I did not look all over her old records. She apparently had a course of HBO and [facility] although I am not sure what the indication would have been. In any case she developed seizures and terminated treatment earlier. She is generally much more disabled than when we last saw her in clinic.  She can no longer walk pretty much wheelchair-bound because predominantly of pain in the left hip. 04/24/2021; the patient tolerated the wraps we put on. We used Santyl and Hydrofera Blue under compression. I brought her back for a nurse visit for a change in dressing. With Medicaid we will have a hard time getting anything paid for and hence the need for compression. She arrives in clinic with all the wounds looking somewhat better in terms of surface 12/20; circumferential wound on the right from the lateral to the medial. She has open areas on the left medial and left lateral x2 on all of this with the same surface. This does not look completely healthy although she does have some epithelialization. She is not complaining of a lot of pain which is unusual for her [name] ulcers. I have not looked over her extensive records from [facility]. She had recent arterial studies and has a history of venous reflux studies I will need to look these over although I do not believe she has significant arterial disease 2023 05/22/2021; patient's wound areas measure slightly smaller. Still a lot of drainage coming from the right we have been using Hydrofera Blue and Santyl with some improvement in the wound surfaces. 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 This was in November and showed biphasic and triphasic waveforms but she could not tolerate ABIs because of pressure and unfortunately TBI's were not done. She has not had recent venous reflux studies that I can see 1/10; not much change about the same surface area. This has a yellowish surface to it very gritty. We have been using Santyl and Hydrofera Blue for a prolonged period. Culture I did last week showed methicillin sensitive staph aureus "rare". Our intake nurse reports greenish drainage which may be the Hydrofera Blue  itself 1/17; wounds are continue to measure smaller although I am not sure about the accuracy here. Especially the areas on the right are covered in what looks to be a nonviable surface although she does have some epithelialization. Similarly she has areas on the left medial and left lateral ankle area which appear to have a better surface and perhaps are slightly smaller. We have been using Santyl and Hydrofera Blue. She cannot tolerate mechanical debridements She went for her reflux studies which showed significant reflux at the greater saphenous vein at the saphenofemoral junction as well as the greater saphenous vein in the proximal calf on the left she had reflux in the thigh and the common femoral vein and supra vein Fishel vein reflux in the greater saphenous vein. I will have vein and vascular look at this. My thoughts have been that these are likely [name] wounds. I looked through her  old records from [facility] wound care center and then when she graduated to [facility] wound care center where she saw Dr. Zigmund Daniel and Dr. Vernona Rieger. Although I can see she had reflux studies done I do not see that she actually saw a vein and vascular. I went over the fact that she had operative debridements and actual skin grafting that did not take. I do not think these wounds have ever really progressed towards healing 1/31;Substantial wounds on the right ankle area. Hyper granulated very gritty adherent debris on the surface. 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 On the left she has no DVT or SVT deep vein reflux in the common femoral vein.  Superficial vein reflux in the greater saphenous vein on although the vein diameter is not really all that large. I do not think there is anything that can be done with these although I am going to send her for consultation to vein and vascular. 2/7; Wound exam; substantial wound area on the right posterior ankle area and areas on the left medial ankle and left lateral ankle. I was able to debride the left medial ankle last week fairly aggressively and it is back this week to a completely nonviable surface She will see vascular surgery this Friday and I would like them to review the venous studies and also any comments on her arterial status. If they do not see an issue here I am going to refer her to plastic surgery for an operative debridement perhaps intraoperative ACell or Integra. Eventually she will require a deep tissue culture again 2/14; substantial wound area on the right posterior ankle, medial ankle. We have been using silver alginate The patient was seen by vein and vascular she had both venous reflux studies and arterial studies. 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 She had no evidence of superficial venous thrombosis. She did have superficial vein reflux at the saphenofemoral junction and the greater saphenous vein. On the left no evidence of a DVT no evidence of superficial venous throat thrombosis she did have deep vein reflux in the common femoral vein and superficial vein reflux in the greater saphenous vein but these were not felt to be amenable to ablation. In terms of arterial studies she had triphasic and wife biphasic wave waveforms bilaterally not felt to have a significant arterial issue. I do not get the feeling that they felt that any part of her nonhealing wounds were related to either arterial or venous issues. They did note that she had venous reflux at the right at the [facility] and [facility]. And also  on the left there were reflux in the deep system at the common femoral vein and greater saphenous vein in the proximal thigh. Nothing amenable to ablation. 2/20; she is making some decent progress on the right where there is nice skin between the 2 open areas on the right ankle. The surfaces here do not look viable yet there is some surrounding epithelialization. She still has a small area on the left medial  ankle area. Hyper-granulated Jody's away always 2/28 patient has an appointment with plastic surgery on 3/8. We will see her back on 3/9. She may have to call us to get the area redressed. We've been using Santyl under silver alginate. We made a nice improvement on the left medial ankle. The larger wounds on the right also looks somewhat better in terms of epithelialization although I think they could benefit from an aggressive debridement if plastic surgery would be willing to do that. Perhaps placement of Integra or a cell Electronic Signature(s) Signed: 07/17/2021 4:13:40 PM By: Linton Ham MD Entered By: Linton Ham on 07/17/2021 12:36:46 -------------------------------------------------------------------------------- Physical Exam Details Patient Name: Date of Service: [name], [name] 07/17/2021 11:45 A M Medical Record Number: [id number] Patient Account Number: [id number] Date of Birth/Sex: Treating RN: 1971-01-06 (51 y.o. F) Primary Care Provider: Cammie [name] Other Clinician: Referring Provider: Treating Provider/Extender: [name] in Treatment: 13 Constitutional Sitting or standing Blood Pressure is within target range for patient.. Pulse regular and within target range for patient.Marland Kitchen Respirations regular, non-labored and within target range.. Temperature is normal and within the target range for the patient.Marland Kitchen Appears in no distress. Notes exam; I continued with the same dressing pending next week's plastic review. The areas on the right  as appeared to have some additional epithelialization better looking wound surfaces. A more dramatic wound improvement on the left medial with a nicer healthier surface. Electronic Signature(s) Signed: 07/17/2021 4:13:40 PM By: Linton Ham MD Entered By: Linton Ham on 07/17/2021 12:37:44 -------------------------------------------------------------------------------- Physician Orders Details Patient Name: Date of Service: [name], [name] 07/17/2021 11:45 A M Medical Record Number: [id number] Patient Account Number: [id number] Date of Birth/Sex: Treating RN: [birth year] (51 y.o. [name] Primary Care Provider: Cammie [name] Other Clinician: Referring Provider: Treating Provider/Extender: [name] in Treatment: 13 Verbal / Phone Orders: No Diagnosis Coding Follow-up Appointments ppointment in 1 week. - Dr. Celine Ahr on Thursday 07/26/21 Return A Other: - ****Referral to Plastic Surgeon (Dr Claudia Desanctis) appointment 07/25/21 @ 12 o'clock***** Bathing/ Shower/ Hygiene May shower with protection but do not get wound dressing(s) wet. - Can get cast protector bags at [facility] or [facility] Edema Control - Lymphedema / SCD / Other Elevate legs to the level of the heart or above for 30 minutes daily and/or when sitting, a frequency of: - throughout the day Avoid standing for long periods of time. Exercise regularly Additional Orders / Instructions Follow Nutritious Diet Wound Treatment Wound #17 - Lower Leg Wound Laterality: Right, Lateral Cleanser: Soap and Water 2 x Per Week/30 Days Discharge Instructions: May shower and wash wound with dial antibacterial soap and water prior to dressing change. Cleanser: Wound Cleanser 2 x Per Week/30 Days Discharge Instructions: Cleanse the wound with wound cleanser prior to applying a clean dressing using gauze sponges, not tissue or cotton balls. Peri-Wound Care: Triamcinolone 15 (g) 2 x Per Week/30 Days Discharge  Instructions: Use triamcinolone 15 (g) as directed Peri-Wound Care: Sween Lotion (Moisturizing lotion) 2 x Per Week/30 Days Discharge Instructions: Apply moisturizing lotion as directed Prim Dressing: KerraCel Ag Gelling Fiber Dressing, 4x5 in (silver alginate) 2 x Per Week/30 Days ary Discharge Instructions: Apply silver alginate to wound bed as instructed Prim Dressing: Santyl Ointment 2 x Per Week/30 Days ary Discharge Instructions: Apply nickel thick amount to wound bed as instructed Secondary Dressing: ABD Pad, 5x9 2 x Per Week/30 Days Discharge Instructions: Apply over primary dressing as directed. Secondary Dressing: Zetuvit  Plus 4x8 in 2 x Per Week/30 Days Discharge Instructions: Apply over primary dressing as directed. Compression Wrap: Kerlix Roll 4.5x3.1 (in/yd) 2 x Per Week/30 Days Discharge Instructions: Apply Kerlix and Coban compression as directed. Compression Wrap: Coban Self-Adherent Wrap 4x5 (in/yd) 2 x Per Week/30 Days Discharge Instructions: Apply over Kerlix as directed. Wound #18 - Ankle Wound Laterality: Left, Posterior Cleanser: Soap and Water 2 x Per Week/30 Days Discharge Instructions: May shower and wash wound with dial antibacterial soap and water prior to dressing change. Cleanser: Wound Cleanser 2 x Per Week/30 Days Discharge Instructions: Cleanse the wound with wound cleanser prior to applying a clean dressing using gauze sponges, not tissue or cotton balls. Peri-Wound Care: Triamcinolone 15 (g) 2 x Per Week/30 Days Discharge Instructions: Use triamcinolone 15 (g) as directed Peri-Wound Care: Sween Lotion (Moisturizing lotion) 2 x Per Week/30 Days Discharge Instructions: Apply moisturizing lotion as directed Prim Dressing: KerraCel Ag Gelling Fiber Dressing, 4x5 in (silver alginate) 2 x Per Week/30 Days ary Discharge Instructions: Apply silver alginate to wound bed as instructed Secondary Dressing: Woven Gauze Sponge, Non-Sterile 4x4 in 2 x Per Week/30  Days Discharge Instructions: Apply over primary dressing as directed. Compression Wrap: Kerlix Roll 4.5x3.1 (in/yd) 2 x Per Week/30 Days Discharge Instructions: Apply Kerlix and Coban compression as directed. Compression Wrap: Coban Self-Adherent Wrap 4x5 (in/yd) 2 x Per Week/30 Days Discharge Instructions: Apply over Kerlix as directed. Wound #19 - Ankle Wound Laterality: Left, Medial Cleanser: Soap and Water 2 x Per Week/30 Days Discharge Instructions: May shower and wash wound with dial antibacterial soap and water prior to dressing change. Cleanser: Wound Cleanser 2 x Per Week/30 Days Discharge Instructions: Cleanse the wound with wound cleanser prior to applying a clean dressing using gauze sponges, not tissue or cotton balls. Peri-Wound Care: Triamcinolone 15 (g) 2 x Per Week/30 Days Discharge Instructions: Use triamcinolone 15 (g) as directed Peri-Wound Care: Sween Lotion (Moisturizing lotion) 2 x Per Week/30 Days Discharge Instructions: Apply moisturizing lotion as directed Prim Dressing: KerraCel Ag Gelling Fiber Dressing, 4x5 in (silver alginate) 2 x Per Week/30 Days ary Discharge Instructions: Apply silver alginate to wound bed as instructed Prim Dressing: Santyl Ointment 2 x Per Week/30 Days ary Discharge Instructions: Apply nickel thick amount to wound bed as instructed Secondary Dressing: ABD Pad, 5x9 2 x Per Week/30 Days Discharge Instructions: Apply over primary dressing as directed. Compression Wrap: Kerlix Roll 4.5x3.1 (in/yd) 2 x Per Week/30 Days Discharge Instructions: Apply Kerlix and Coban compression as directed. Compression Wrap: Coban Self-Adherent Wrap 4x5 (in/yd) 2 x Per Week/30 Days Discharge Instructions: Apply over Kerlix as directed. Wound #21 - Ankle Wound Laterality: Right, Medial Cleanser: Soap and Water 2 x Per Week/30 Days Discharge Instructions: May shower and wash wound with dial antibacterial soap and water prior to dressing change. Cleanser: Wound  Cleanser 2 x Per Week/30 Days Discharge Instructions: Cleanse the wound with wound cleanser prior to applying a clean dressing using gauze sponges, not tissue or cotton balls. Peri-Wound Care: Triamcinolone 15 (g) 2 x Per Week/30 Days Discharge Instructions: Use triamcinolone 15 (g) as directed Peri-Wound Care: Sween Lotion (Moisturizing lotion) 2 x Per Week/30 Days Discharge Instructions: Apply moisturizing lotion as directed Prim Dressing: KerraCel Ag Gelling Fiber Dressing, 4x5 in (silver alginate) 2 x Per Week/30 Days ary Discharge Instructions: Apply silver alginate to wound bed as instructed Secondary Dressing: ABD Pad, 5x9 2 x Per Week/30 Days Discharge Instructions: Apply over primary dressing as directed. Secondary Dressing: Zetuvit Plus 4x8 in  2 x Per Week/30 Days Discharge Instructions: Apply over primary dressing as directed. Compression Wrap: Kerlix Roll 4.5x3.1 (in/yd) 2 x Per Week/30 Days Discharge Instructions: Apply Kerlix and Coban compression as directed. Compression Wrap: Coban Self-Adherent Wrap 4x5 (in/yd) 2 x Per Week/30 Days Discharge Instructions: Apply over Kerlix as directed. Electronic Signature(s) Signed: 07/17/2021 4:13:40 PM By: Linton Ham MD Signed: 07/17/2021 6:17:08 PM By: Dellie Catholic RN Entered By: Dellie Catholic on 07/17/2021 12:16:30 -------------------------------------------------------------------------------- Problem List Details Patient Name: Date of Service: [name], [name] 07/17/2021 11:45 A M Medical Record Number: [id number] Patient Account Number: [id number] Date of Birth/Sex: Treating RN: 1970-09-09 (51 y.o. F) Primary Care Provider: Cammie [name] Other Clinician: Referring Provider: Treating Provider/Extender: Bernette Redbird in Treatment: 13 Active Problems ICD-10 Encounter Code Description Active Date MDM Diagnosis L97.818 Non-pressure chronic ulcer of other part of right lower leg with other  specified 04/17/2021 No Yes severity L97.828 Non-pressure chronic ulcer of other part of left lower leg with other specified 04/17/2021 No Yes severity D57.1 [name]-cell disease without crisis 04/17/2021 No Yes Inactive Problems Resolved Problems Electronic Signature(s) Signed: 07/17/2021 4:13:40 PM By: Linton Ham MD Entered By: Linton Ham on 07/17/2021 12:29:58 -------------------------------------------------------------------------------- Progress Note Details Patient Name: Date of Service: [name], [name] 07/17/2021 11:45 A M Medical Record Number: [id number] Patient Account Number: [id number] Date of Birth/Sex: Treating RN: [birth year] (51 y.o. F) Primary Care Provider: Cammie [name] Other Clinician: Referring Provider: Treating Provider/Extender: [name] in Treatment: 13 Subjective History of Present Illness (HPI) The following HPI elements were documented for the patient's wound: Location: medial and lateral ankle region on the right and left medial malleolus Quality: Patient reports experiencing a shooting pain to affected area(s). Severity: Patient states wound(s) are getting worse. Duration: right lower extremity bimalleolar ulcers have been present for approximately 2 years; the rright meedial malleolus ulcer has been there proximally 6 months Timing: Pain in wound is constant (hurts all the time) Context: The wound would happen gradually Associated Signs and Symptoms: Patient reports having increase discharge. 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 At that stage she was referred from the plastic surgery service to dermatology who treated her for a skin infection with doxycycline and then Levaquin and  a local antibiotic ointment. I understand the patient has since developed ulceration on the left ankle both medial and lateral and was now referred back to the wound center as dermatology has finished the management. 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 She has been visiting the wound center [facility] and was seen by [name] and [name]. after prolonged conservator therapy between July 2016 and January 2017. She had been seen by the plastic surgeon and taken to the OR for debridement and application of [name]. She had 3 applications of [name] and was then treated with collagen. Prior to that she had a history of similar problems in 2014 and was treated conservatively. Had a reflux study done for the right lower extremity in August 2016 without reflux or  DVT . Past medical history significant for [name] cell disease, anemia, leg ulcers, cholelithiasis,and has never been a [name]. Once the patient was discharged on the wound center she says within 2 or 3 [name] the problems recurred and she has been treating it conservatively. since I saw her 3 [name] ago at [facility] she has been unable to get her dressing material but has completed a course of doxycycline. 6/7/ 2017 -- lower extremity venous duplex reflux evaluation was done oo No evidence of SVT or DVT in the RLL. No venous incompetence in the RLL. No further vascular workup is indicated at this time. She was seen by [name], on 10/04/2015. She agreed with the plan of taking her to the OR for debridement and application of [name] and would also take biopsies to rule out pyoderma gangrenosum. Follow-up note dated May 31 received and she was status post application of [name] to multiple ulcers  around the right ankle. Pathology did not show evidence of malignancy or pyoderma gangrenosum. She would continue to see as in the wound clinic for further care and see [name] as needed. The patient brought the biopsy report and it was consistent with stasis ulcer no evidence of malignancy and the comment was that there was some adjacent neovascularization, fibrosis and patchy perivascular chronic inflammation. 11/15/2015 -- today we applied her first application of [name] 11/30/15; [name] #2 12/13/2015 -- she is having a lot of pain locally and is here for possible application of a [name] today. 01/16/2016 -- the patient has significant pain and has noticed despite in spite of all local care and oral pain medication. It is impossible to debride her in the office. 02/06/2016 -- I do not see any notes from [name]( the patient has not made a call to the office know as she heard from them) and the only visit to recently was with her PCP Dr. Danella [name] -- I saw her on 01/16/2016 and prescribed 90 tablets of oxycodone 10 mg and did lab work and screening for HIV. the HIV was negative and hemoglobin was 6.3 with a WBC count of 14.9 and hematocrit of 17.8 with platelets of 561. reticulocyte count was 15.5% READMISSION: 07/10/2016- The patient is here for readmission for bilateral lower extremity ulcers in the presence of [name] cell. The bimalleolar ulcers to the right lower extremity have been present for approximately 2 years, the left medial malleolus ulcer has been present approximately 6 months. She has followed with [name] in the past and has had a total of 3 applications of [name] (01/2015, 09/2015, 06/17/16). She has also followed with [name] here in the clinic and has received 2 applications of [name] (11/10/15, 11/30/15). The patient does experience chronic, and is not amenable to debridement. She had a [name] cell crisis in December 2017, prior to that has been  several years. She is not currently on any antibiotic therapy and has not been treated with any recently. 07/17/2016 -- was seen by [name] of plastic surgery who saw her 2 [name] postop application of [name] #3. She had removed her dressing and asked her to apply silver alginate on alternate days and follow-up back with the wound center. Future debridements and application of skin substitute would have to be done in the hospital due to her high risk for anesthesia. READMISSION 04/17/2021 Patient is now a 51 year old woman that we have had in this clinic for a prolonged period of time and 2016-2017 and then again for 2 visits  in February 2018. At that point she had wounds on the right lower leg predominantly medial. She had also been seen by plastic surgery [name] who I believe took her to the OR for operative debridement and application of [name] in 2017. After she left our clinic she was followed for a very prolonged period of time in the wound care center in [facility] who then referred her ultimately to [facility] where she was seen by Dr. Vernona Rieger. Again taken her to the OR for skin grafting which apparently did not take. She had multiple other attempts at dressings although I have not really looked over all of these notes in great detail. She has not been seen in a wound care center in about a year. She states over the last year in addition to her right lower leg she has developed wounds on the left lower leg quite extensive. She is using Xeroform to all of these wounds without really any improvement. She also has Medicaid which does not cover wound products. The patient has had vascular work-ups in the past including most recently on 03/28/2021 showing biphasic waveforms on the right triphasic at the PTA and biphasic at the dorsalis pedis on the left. She was unable to tolerate any degree of compression to do ABIs. Unfortunately TBI's were also not done. She had venous reflux studies  done in 2017. This did not show any evidence of a DVT or SVT and no venous incompetence was noted in the right leg at the time this was the only side with the wound As noted I did not look all over her old records. She apparently had a course of HBO and [facility] although I am not sure what the indication would have been. In any case she developed seizures and terminated treatment earlier. She is generally much more disabled than when we last saw her in clinic. She can no longer walk pretty much wheelchair-bound because predominantly of pain in the left hip. 04/24/2021; the patient tolerated the wraps we put on. We used Santyl and Hydrofera Blue under compression. I brought her back for a nurse visit for a change in dressing. With Medicaid we will have a hard time getting anything paid for and hence the need for compression. She arrives in clinic with all the wounds looking somewhat better in terms of surface 12/20; circumferential wound on the right from the lateral to the medial. She has open areas on the left medial and left lateral x2 on all of this with the same surface. This does not look completely healthy although she does have some epithelialization. She is not complaining of a lot of pain which is unusual for her [name] ulcers. I have not looked over her extensive records from [facility]. She had recent arterial studies and has a history of venous reflux studies I will need to look these over although I do not believe she has significant arterial disease 2023 05/22/2021; patient's wound areas measure slightly smaller. Still a lot of drainage coming from the right we have been using Hydrofera Blue and Santyl with some improvement in the wound surfaces. 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 This was in November and showed biphasic and triphasic waveforms but she  could not tolerate ABIs because of pressure and unfortunately TBI's were not done. She has not had recent venous reflux studies  that I can see 1/10; not much change about the same surface area. This has a yellowish surface to it very gritty. We have been using Santyl and Hydrofera Blue for a prolonged period. Culture I did last week showed methicillin sensitive staph aureus "rare". Our intake nurse reports greenish drainage which may be the Hydrofera Blue itself 1/17; wounds are continue to measure smaller although I am not sure about the accuracy here. Especially the areas on the right are covered in what looks to be a nonviable surface although she does have some epithelialization. Similarly she has areas on the left medial and left lateral ankle area which appear to have a better surface and perhaps are slightly smaller. We have been using Santyl and Hydrofera Blue. She cannot tolerate mechanical debridements She went for her reflux studies which showed significant reflux at the greater saphenous vein at the saphenofemoral junction as well as the greater saphenous vein in the proximal calf on the left she had reflux in the thigh and the common femoral vein and supra vein Fishel vein reflux in the greater saphenous vein. I will have vein and vascular look at this. My thoughts have been that these are likely [name] wounds. I looked through her old records from [facility] wound care center and then when she graduated to [facility] wound care center where she saw Dr. Zigmund Daniel and Dr. Vernona Rieger. Although I can see she had reflux studies done I do not see that she actually saw a vein and vascular. I went over the fact that she had operative debridements and actual skin grafting that did not take. I do not think these wounds have ever really progressed towards healing 1/31;Substantial wounds on the right ankle area. Hyper granulated very gritty adherent debris on the surface. 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 On the left she has no DVT or SVT deep vein reflux in the common femoral vein. Superficial vein reflux in the greater saphenous vein on although the vein diameter is not really all that large. I do not think there is anything that can be done with these although I am going to send her for consultation to vein and vascular. 2/7; Wound exam; substantial wound area on the right posterior ankle area and areas on the left medial ankle and left lateral ankle. I was able to debride the left medial ankle last week fairly aggressively and it is back this week to a completely nonviable surface She will see vascular surgery this Friday and I would like them to review the venous studies and also any comments on her arterial status. If they do not see an issue here I am going to refer her to plastic surgery for an operative debridement perhaps intraoperative ACell or Integra. Eventually she will require a deep tissue culture again 2/14; substantial wound area on the right posterior ankle, medial ankle. We have been using silver alginate The patient was seen by vein and vascular she had both venous reflux studies and arterial studies. 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 She had no evidence of superficial venous thrombosis. She did have superficial vein reflux at the saphenofemoral junction and the greater saphenous vein. On the left no evidence of a DVT no evidence of superficial venous throat thrombosis she did have deep vein reflux in the common femoral vein  and superficial vein reflux in the greater saphenous vein but these were not felt to be amenable to ablation. In terms of arterial studies she had triphasic and wife biphasic wave waveforms bilaterally not felt to have a significant arterial issue. I do not get the feeling that they felt that any part of her nonhealing wounds were related to either arterial or venous issues. They did note that she had venous reflux at the right at the [facility] and [facility]. And also on the left there were reflux in the deep system at the common femoral vein and greater saphenous vein in the proximal thigh. Nothing amenable to ablation. 2/20; she is making some decent progress on the right where there is nice skin between the 2 open areas on the right ankle. The surfaces here do not look viable yet there is some surrounding epithelialization. She still has a small area on the left medial ankle area. Hyper-granulated Jody's away always 2/28 patient has an appointment with plastic surgery on 3/8. We will see her back on 3/9. She may have to call us to get the area redressed. We've been using Santyl under silver alginate. We made a nice improvement on the left medial ankle. The larger wounds on the right also looks somewhat better in terms of epithelialization although I think they could benefit from an aggressive debridement if plastic surgery would be willing to do that. Perhaps placement of Integra or a cell Objective Constitutional Sitting or standing Blood Pressure is within target range for patient.. Pulse regular and within target range for patient.Marland Kitchen Respirations regular, non-labored and within target range.. Temperature is normal and within the target range for the patient.Marland Kitchen Appears in no distress. Vitals Time Taken: 11:45 AM, Height: 67 in, Temperature: 98.4 F, Pulse: 91 bpm, Respiratory Rate: 18 breaths/min, Blood Pressure: 135/83 mmHg. General Notes: exam; I continued with the same dressing pending next week's plastic  review. The areas on the right as appeared to have some additional epithelialization better looking wound surfaces. A more dramatic wound improvement on the left medial with a nicer healthier surface. Integumentary (Hair, Skin) Wound #17 status is Open. Original cause of wound was Gradually Appeared. The date acquired was: 10/05/2012. The wound has been in treatment 13 [name]. The wound is located on the Right,Lateral Lower Leg. The wound measures 9.6cm length x 5.5cm width x 0.1cm depth; 41.469cm^2 area and 4.147cm^3 volume. There is Fat Layer (Subcutaneous Tissue) exposed. There is no tunneling or undermining noted. There is a large amount of purulent drainage noted. There is small (1-33%) pink granulation within the wound bed. There is a large (67-100%) amount of necrotic tissue within the wound bed including Adherent Slough. Wound #18 status is Open. Original cause of wound was Gradually Appeared. The date acquired was: 12/25/2020. The wound has been in treatment 13 [name]. The wound is located on the Left,Posterior Ankle. The wound measures 1cm length x 1cm width x 0.1cm depth; 0.785cm^2 area and 0.079cm^3 volume. There is Fat Layer (Subcutaneous Tissue) exposed. There is no tunneling or undermining noted. There is a medium amount of serosanguineous drainage noted. The wound margin is flat and intact. There is large (67-100%) red, pink  granulation within the wound bed. There is no necrotic tissue within the wound bed. Wound #18 status is Open. Original cause of wound was Gradually Appeared. The date acquired was: 12/25/2020. The wound has been in treatment 13 [name]. The wound is located on the Left,Posterior Ankle. The wound measures 1cm length x 1cm width x 0.1cm depth; 0.785cm^2 area and 0.079cm^3 volume. There is Fat Layer (Subcutaneous Tissue) exposed. There is no tunneling or undermining noted. There is a medium amount of serosanguineous drainage noted. The wound margin is flat and intact. There is  large (67-100%) red, pink granulation within the wound bed. There is no necrotic tissue within the wound bed. Wound #19 status is Open. Original cause of wound was Gradually Appeared. The date acquired was: 12/25/2020. The wound has been in treatment 13 [name]. The wound is located on the Left,Medial Ankle. The wound measures 2.3cm length x 1.9cm width x 0.1cm depth; 3.432cm^2 area and 0.343cm^3 volume. There is Fat Layer (Subcutaneous Tissue) exposed. There is no tunneling or undermining noted. There is a medium amount of serosanguineous drainage noted. The wound margin is flat and intact. There is large (67-100%) red granulation within the wound bed. There is a small (1-33%) amount of necrotic tissue within the wound bed including Adherent Slough. Wound #21 status is Open. Original cause of wound was Gradually Appeared. The date acquired was: 06/26/2021. The wound has been in treatment 3 [name]. The wound is located on the Right,Medial Ankle. The wound measures 6.6cm length x 4cm width x 0.1cm depth; 20.735cm^2 area and 2.073cm^3 volume. There is Fat Layer (Subcutaneous Tissue) exposed. There is no tunneling or undermining noted. There is a large amount of purulent drainage noted. There is medium (34-66%) red granulation within the wound bed. There is a medium (34-66%) amount of necrotic tissue within the wound bed including Adherent Slough. 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 - Dr. Celine Ahr on Thursday 07/26/21 Other: - ****Referral to Plastic Surgeon (Dr Claudia Desanctis) appointment 07/25/21 @ 12 o'clock***** Bathing/ Shower/ Hygiene: May shower with protection but do not get wound dressing(s) wet. - Can get cast protector bags at [facility] or  [facility] Edema Control - Lymphedema / SCD / Other: Elevate legs to the level of the heart or above for 30 minutes daily and/or when sitting, a frequency of: - throughout the day Avoid standing for long periods of time. Exercise regularly Additional Orders / Instructions: Follow Nutritious Diet WOUND #17: - Lower Leg Wound Laterality: Right, Lateral Cleanser: Soap and Water 2 x Per Week/30 Days Discharge Instructions: May shower and wash wound with dial antibacterial soap and water prior to dressing change. Cleanser: Wound Cleanser 2 x Per Week/30 Days Discharge Instructions: Cleanse the wound with wound cleanser prior to applying a clean dressing using gauze sponges, not tissue or cotton balls. Peri-Wound Care: Triamcinolone 15 (g) 2 x Per Week/30 Days Discharge Instructions: Use triamcinolone 15 (g) as directed Peri-Wound Care: Sween Lotion (Moisturizing lotion) 2 x Per Week/30 Days Discharge Instructions: Apply moisturizing lotion as directed Prim Dressing: KerraCel Ag Gelling Fiber Dressing, 4x5 in (silver alginate) 2 x Per Week/30 Days ary Discharge Instructions: Apply silver alginate to wound bed as instructed Prim Dressing: Santyl Ointment 2 x Per Week/30 Days ary Discharge Instructions: Apply nickel thick amount to wound bed  as instructed Secondary Dressing: ABD Pad, 5x9 2 x Per Week/30 Days Discharge Instructions: Apply over primary dressing as directed. Secondary Dressing: Zetuvit Plus 4x8 in 2 x Per Week/30 Days Discharge Instructions: Apply over primary dressing as directed. Com pression Wrap: Kerlix Roll 4.5x3.1 (in/yd) 2 x Per Week/30 Days Discharge Instructions: Apply Kerlix and Coban compression as directed. Com pression Wrap: Coban Self-Adherent Wrap 4x5 (in/yd) 2 x Per Week/30 Days Discharge Instructions: Apply over Kerlix as directed. WOUND #18: - Ankle Wound Laterality: Left, Posterior Cleanser: Soap and Water 2 x Per Week/30 Days Discharge Instructions: May shower and  wash wound with dial antibacterial soap and water prior to dressing change. Cleanser: Wound Cleanser 2 x Per Week/30 Days Discharge Instructions: Cleanse the wound with wound cleanser prior to applying a clean dressing using gauze sponges, not tissue or cotton balls. Peri-Wound Care: Triamcinolone 15 (g) 2 x Per Week/30 Days Discharge Instructions: Use triamcinolone 15 (g) as directed Peri-Wound Care: Sween Lotion (Moisturizing lotion) 2 x Per Week/30 Days Discharge Instructions: Apply moisturizing lotion as directed Prim Dressing: KerraCel Ag Gelling Fiber Dressing, 4x5 in (silver alginate) 2 x Per Week/30 Days ary Discharge Instructions: Apply silver alginate to wound bed as instructed Secondary Dressing: Woven Gauze Sponge, Non-Sterile 4x4 in 2 x Per Week/30 Days Discharge Instructions: Apply over primary dressing as directed. Com pression Wrap: Kerlix Roll 4.5x3.1 (in/yd) 2 x Per Week/30 Days Discharge Instructions: Apply Kerlix and Coban compression as directed. Com pression Wrap: Coban Self-Adherent Wrap 4x5 (in/yd) 2 x Per Week/30 Days Discharge Instructions: Apply over Kerlix as directed. WOUND #19: - Ankle Wound Laterality: Left, Medial Cleanser: Soap and Water 2 x Per Week/30 Days Discharge Instructions: May shower and wash wound with dial antibacterial soap and water prior to dressing change. Cleanser: Wound Cleanser 2 x Per Week/30 Days Discharge Instructions: Cleanse the wound with wound cleanser prior to applying a clean dressing using gauze sponges, not tissue or cotton balls. Peri-Wound Care: Triamcinolone 15 (g) 2 x Per Week/30 Days Discharge Instructions: Use triamcinolone 15 (g) as directed Peri-Wound Care: Sween Lotion (Moisturizing lotion) 2 x Per Week/30 Days Discharge Instructions: Apply moisturizing lotion as directed Prim Dressing: KerraCel Ag Gelling Fiber Dressing, 4x5 in (silver alginate) 2 x Per Week/30 Days ary Discharge Instructions: Apply silver alginate to  wound bed as instructed Prim Dressing: Santyl Ointment 2 x Per Week/30 Days ary Discharge Instructions: Apply nickel thick amount to wound bed as instructed Secondary Dressing: ABD Pad, 5x9 2 x Per Week/30 Days Discharge Instructions: Apply over primary dressing as directed. Com pression Wrap: Kerlix Roll 4.5x3.1 (in/yd) 2 x Per Week/30 Days Discharge Instructions: Apply Kerlix and Coban compression as directed. Com pression Wrap: Coban Self-Adherent Wrap 4x5 (in/yd) 2 x Per Week/30 Days Discharge Instructions: Apply over Kerlix as directed. WOUND #21: - Ankle Wound Laterality: Right, Medial Cleanser: Soap and Water 2 x Per Week/30 Days Discharge Instructions: May shower and wash wound with dial antibacterial soap and water prior to dressing change. Cleanser: Wound Cleanser 2 x Per Week/30 Days Discharge Instructions: Cleanse the wound with wound cleanser prior to applying a clean dressing using gauze sponges, not tissue or cotton balls. Peri-Wound Care: Triamcinolone 15 (g) 2 x Per Week/30 Days Discharge Instructions: Use triamcinolone 15 (g) as directed Peri-Wound Care: Sween Lotion (Moisturizing lotion) 2 x Per Week/30 Days Discharge Instructions: Apply moisturizing lotion as directed Prim Dressing: KerraCel Ag Gelling Fiber Dressing, 4x5 in (silver alginate) 2 x Per Week/30 Days ary Discharge Instructions: Apply silver alginate  to wound bed as instructed Secondary Dressing: ABD Pad, 5x9 2 x Per Week/30 Days Discharge Instructions: Apply over primary dressing as directed. Secondary Dressing: Zetuvit Plus 4x8 in 2 x Per Week/30 Days Discharge Instructions: Apply over primary dressing as directed. Com pression Wrap: Kerlix Roll 4.5x3.1 (in/yd) 2 x Per Week/30 Days Discharge Instructions: Apply Kerlix and Coban compression as directed. Com pression Wrap: Coban Self-Adherent Wrap 4x5 (in/yd) 2 x Per Week/30 Days Discharge Instructions: Apply over Kerlix as directed. #1 I haven't change  the primary dressing pending plastic surgery review. The question is whether they be willing to do an operative debridement. She simply cannot stand out in the clinic #2 we have made minor improvements on the right and a more major improvement on the left medial #3 [name] cell ulcers Electronic Signature(s) Signed: 07/17/2021 4:13:40 PM By: Linton Ham MD Entered By: Linton Ham on 07/17/2021 12:39:00 -------------------------------------------------------------------------------- SuperBill Details Patient Name: Date of Service: [place], [name] 07/17/2021 Medical Record Number: [id number] Patient Account Number: [id number] Date of Birth/Sex: Treating RN: 03/08/1971 (51 y.o. 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 [name]-cell disease without crisis Facility Procedures CPT4 Code: [id number] Description: 99214 - WOUND CARE VISIT-LEV 4 EST PT Modifier: Quantity: 1 Physician Procedures : CPT4 Code Description Modifier [id number] - WC PHYS LEVEL 3 - EST PT ICD-10 Diagnosis Description L97.818 Non-pressure chronic ulcer of other part of right lower leg with other specified severity L97.828 Non-pressure chronic ulcer of other part of  left lower leg with other specified severity 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

## 2021-07-27 NOTE — Progress Notes (Signed)
[name] ([id number]) Visit Report for 07/26/2021 Chief Complaint Document Details Patient Name: Date of Service: [name] [name] 07/26/2021 10:00 A M Medical Record Number: [id number] Patient Account Number: [id number] Date of Birth/Sex: Treating RN: Jul 08, 1970 (51 y.o. F) Primary Care Provider: [name] Other Clinician: Referring Provider: Treating Provider/Extender: Elyse Jarvis Weeks in Treatment: 14 Information Obtained from: Patient Chief Complaint the patient is here for evaluation of her bilateral lower extremity sickle cell ulcers 04/17/2021; patient comes in for substantial wounds on the right and left lower leg Electronic Signature(s) Signed: 07/27/2021 7:07:40 AM By: Fredirick Maudlin MD FACS Previous Signature: 07/26/2021 10:28:40 AM Version By: Fredirick Maudlin MD FACS Entered By: Fredirick Maudlin on 07/27/2021 07:07:40 -------------------------------------------------------------------------------- Debridement Details Patient Name: Date of Service: [name] [name], FA [name] 07/26/2021 10:00 A M Medical Record Number: [id number] Patient Account Number: [id number] Date of Birth/Sex: Treating RN: December 28, 1970 (50 y.o. Tonita Phoenix, Lauren Primary Care Provider: [name] Other Clinician: Referring Provider: Treating Provider/Extender: Elyse Jarvis Weeks in Treatment: 14 Debridement Performed for Assessment: Wound #17 Right,Lateral Lower Leg Performed By: Physician Fredirick Maudlin, MD Debridement Type: Debridement Level of Consciousness (Pre-procedure): Awake and Alert Pre-procedure Verification/Time Out Yes - 10:15 Taken: Start Time: 10:15 Pain Control: Lidocaine T Area Debrided (L x W): otal 7.5 (cm) x 4 (cm) = 30 (cm) Tissue and other material debrided: Viable, Non-Viable, Slough, Subcutaneous, Slough Level: Skin/Subcutaneous Tissue Debridement Description: Excisional Instrument: Curette Bleeding:  Minimum Hemostasis Achieved: Pressure End Time: 10:15 Procedural Pain: 0 Post Procedural Pain: 0 Response to Treatment: Procedure was tolerated well Level of Consciousness (Post- Awake and Alert procedure): Post Debridement Measurements of Total Wound Length: (cm) 9.5 Width: (cm) 5.5 Depth: (cm) 0.1 Volume: (cm) 4.104 Character of Wound/Ulcer Post Debridement: Improved Post Procedure Diagnosis Same as Pre-procedure Electronic Signature(s) Signed: 07/26/2021 5:18:04 PM By: Rhae Hammock RN Signed: 07/27/2021 8:31:39 AM By: Fredirick Maudlin MD FACS Entered By: Rhae Hammock on 07/26/2021 10:19:46 -------------------------------------------------------------------------------- Debridement Details Patient Name: Date of Service: [name] [name], FA [name] 07/26/2021 10:00 A M Medical Record Number: [id number] Patient Account Number: [id number] Date of Birth/Sex: Treating RN: [birth year] (51 y.o. Tonita Phoenix, Lauren Primary Care Provider: [name] Other Clinician: Referring Provider: Treating Provider/Extender: Elyse Jarvis Weeks in Treatment: 14 Debridement Performed for Assessment: Wound #21 Right,Medial Ankle Performed By: Physician Fredirick Maudlin, MD Debridement Type: Debridement Severity of Tissue Pre Debridement: Fat layer exposed Level of Consciousness (Pre-procedure): Awake and Alert Pre-procedure Verification/Time Out Yes - 10:15 Taken: Start Time: 10:15 Pain Control: Lidocaine T Area Debrided (L x W): otal 5 (cm) x 2 (cm) = 10 (cm) Tissue and other material debrided: Viable, Non-Viable, Slough, Subcutaneous, Slough Level: Skin/Subcutaneous Tissue Debridement Description: Excisional Instrument: Curette Bleeding: Minimum Hemostasis Achieved: Pressure End Time: 10:15 Procedural Pain: 0 Post Procedural Pain: 0 Response to Treatment: Procedure was tolerated well Level of Consciousness (Post- Awake and Alert procedure): Post Debridement  Measurements of Total Wound Length: (cm) 6.5 Width: (cm) 3.2 Depth: (cm) 0.1 Volume: (cm) 1.634 Character of Wound/Ulcer Post Debridement: Improved Severity of Tissue Post Debridement: Fat layer exposed Post Procedure Diagnosis Same as Pre-procedure Electronic Signature(s) Signed: 07/26/2021 5:18:04 PM By: Rhae Hammock RN Signed: 07/27/2021 8:31:39 AM By: Fredirick Maudlin MD FACS Entered By: Rhae Hammock on 07/26/2021 10:21:02 -------------------------------------------------------------------------------- Debridement Details Patient Name: Date of Service: [name] [name], FA [name] 07/26/2021 10:00 A M Medical Record Number: [id number] Patient Account Number: [id number] Date of Birth/Sex: Treating RN: [birth year] (51 y.o. F) Hollie Salk,  Lauren Primary Care Provider: [name] Other Clinician: Referring Provider: Treating Provider/Extender: Elyse Jarvis Weeks in Treatment: 14 Debridement Performed for Assessment: Wound #19 Left,Medial Ankle Performed By: Physician Fredirick Maudlin, MD Debridement Type: Debridement Level of Consciousness (Pre-procedure): Awake and Alert Pre-procedure Verification/Time Out Yes - 10:15 Taken: Start Time: 10:15 Pain Control: Lidocaine T Area Debrided (L x W): otal 1.8 (cm) x 1 (cm) = 1.8 (cm) Tissue and other material debrided: Viable, Non-Viable, Slough, Subcutaneous, Slough Level: Skin/Subcutaneous Tissue Debridement Description: Excisional Instrument: Curette Bleeding: Minimum Hemostasis Achieved: Pressure End Time: 10:15 Procedural Pain: 0 Post Procedural Pain: 0 Response to Treatment: Procedure was tolerated well Level of Consciousness (Post- Awake and Alert procedure): Post Debridement Measurements of Total Wound Length: (cm) 1.9 Width: (cm) 1 Depth: (cm) 0.1 Volume: (cm) 0.149 Character of Wound/Ulcer Post Debridement: Improved Post Procedure Diagnosis Same as Pre-procedure Electronic  Signature(s) Signed: 07/26/2021 5:18:04 PM By: Rhae Hammock RN Signed: 07/27/2021 8:31:39 AM By: Fredirick Maudlin MD FACS Entered By: Rhae Hammock on 07/26/2021 10:21:33 -------------------------------------------------------------------------------- Debridement Details Patient Name: Date of Service: [name] [name], FA [name] 07/26/2021 10:00 A M Medical Record Number: [id number] Patient Account Number: [id number] Date of Birth/Sex: Treating RN: 06-04-1970 (51 y.o. Tonita Phoenix, Lauren Primary Care Provider: [name] Other Clinician: Referring Provider: Treating Provider/Extender: Elyse Jarvis Weeks in Treatment: 14 Debridement Performed for Assessment: Wound #18 Left,Posterior Ankle Performed By: Physician Fredirick Maudlin, MD Debridement Type: Debridement Level of Consciousness (Pre-procedure): Awake and Alert Pre-procedure Verification/Time Out Yes - 10:15 Taken: Start Time: 10:15 Pain Control: Lidocaine T Area Debrided (L x W): otal 0.4 (cm) x 0.4 (cm) = 0.16 (cm) Tissue and other material debrided: Viable, Non-Viable, Slough, Subcutaneous, Slough Level: Skin/Subcutaneous Tissue Debridement Description: Excisional Instrument: Curette Bleeding: Minimum Hemostasis Achieved: Pressure End Time: 10:15 Procedural Pain: 0 Post Procedural Pain: 0 Response to Treatment: Procedure was tolerated well Level of Consciousness (Post- Awake and Alert procedure): Post Debridement Measurements of Total Wound Length: (cm) 0.4 Width: (cm) 0.4 Depth: (cm) 0.1 Volume: (cm) 0.013 Character of Wound/Ulcer Post Debridement: Improved Post Procedure Diagnosis Same as Pre-procedure Electronic Signature(s) Signed: 07/26/2021 5:18:04 PM By: Rhae Hammock RN Signed: 07/27/2021 8:31:39 AM By: Fredirick Maudlin MD FACS Entered By: Rhae Hammock on 07/26/2021 10:22:29 -------------------------------------------------------------------------------- HPI  Details Patient Name: Date of Service: [name] [name], FA [name] 07/26/2021 10:00 A M Medical Record Number: [id number] Patient Account Number: [id number] Date of Birth/Sex: Treating RN: Apr 01, 1971 (51 y.o. F) Primary Care Provider: [name] Other Clinician: Referring Provider: Treating Provider/Extender: Elyse Jarvis Weeks in Treatment: 14 History of Present Illness Location: medial and lateral ankle region on the right and left medial malleolus Quality: Patient reports experiencing a shooting pain to affected area(s). Severity: Patient states wound(s) are getting worse. Duration: right lower extremity bimalleolar ulcers have been present for approximately 2 years; the rright meedial malleolus ulcer has been there proximally 6 months Timing: Pain in wound is constant (hurts all the time) Context: The wound would happen gradually ssociated Signs and Symptoms: Patient reports having increase discharge. A HPI Description: 51 year old patient with a history of sickle cell anemia who was last seen by me with ulceration of the right lower extremity above the ankle and was referred to Dr. Leland Johns for a surgical debridement as I was unable to do anything in the office due to excruciating pain. At that stage she was referred from the plastic surgery service to dermatology who treated her for a skin infection with doxycycline and  then Levaquin and a local antibiotic ointment. I understand the patient has since developed ulceration on the left ankle both medial and lateral and was now referred back to the wound center as dermatology has finished the management. I do not have any notes from the dermatology department Old notes: 51 year old patient with a history of sickle cell anemia, pain bilateral lower extremities, right lower extremity ulcer and has a history of receiving a skin graft( Theraskin) several months ago. She has been visiting the wound center [facility] and was seen  by Dr. Dellia Nims and Dr. Leland Johns. after prolonged conservator therapy between July 2016 and January 2017. She had been seen by the plastic surgeon and taken to the OR for debridement and application of Theraskin. She had 3 applications of Theraskin and was then treated with collagen. Prior to that she had a history of similar problems in 2014 and was treated conservatively. Had a reflux study done for the right lower extremity in August 2016 without reflux or DVT . Past medical history significant for sickle cell disease, anemia, leg ulcers, cholelithiasis,and has never been a smoker. Once the patient was discharged on the wound center she says within 2 or 3 weeks the problems recurred and she has been treating it conservatively. since I saw her 3 weeks ago at [facility] she has been unable to get her dressing material but has completed a course of doxycycline. 6/7/ 2017 -- lower extremity venous duplex reflux evaluation was done No evidence of SVT or DVT in the RLL. No venous incompetence in the RLL. No further vascular workup is indicated at this time. She was seen by Dr. [name], on 10/04/2015. She agreed with the plan of taking her to the OR for debridement and application of theraskin and would also take biopsies to rule out pyoderma gangrenosum. Follow-up note dated May 31 received and she was status post application of Theraskin to multiple ulcers around the right ankle. Pathology did not show evidence of malignancy or pyoderma gangrenosum. She would continue to see as in the wound clinic for further care and see Dr. Leland Johns as needed. The patient brought the biopsy report and it was consistent with stasis ulcer no evidence of malignancy and the comment was that there was some adjacent neovascularization, fibrosis and patchy perivascular chronic inflammation. 11/15/2015 -- today we applied her first application of Theraskin 11/30/15; TheraSkin #2 12/13/2015 -- she is having a lot of  pain locally and is here for possible application of a theraskin today. 01/16/2016 -- the patient has significant pain and has noticed despite in spite of all local care and oral pain medication. It is impossible to debride her in the office. 02/06/2016 -- I do not see any notes from Dr. Iran Planas( the patient has not made a call to the office know as she heard from them) and the only visit to recently was with her PCP Dr. Danella Penton -- I saw her on 01/16/2016 and prescribed 90 tablets of oxycodone 10 mg and did lab work and screening for HIV. the HIV was negative and hemoglobin was 6.3 with a WBC count of 14.9 and hematocrit of 17.8 with platelets of 561. 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 The bimalleolar ulcers to the right lower extremity have been present for approximately 2 years, the left medial malleolus ulcer has been present approximately 6 months. She has followed with Dr.Thimmappa in the  past and has had a total of 3 applications of Theraskin (01/2015, 09/2015, 06/17/16). She has also followed with Dr. Con Memos here in the clinic and has received 2 applications of TheraSkin (11/10/15, 11/30/15). The patient does experience chronic, and is not amenable to debridement. 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 She is not currently on any antibiotic therapy and has not been treated with any recently. 07/17/2016 -- was seen by Dr. Iran Planas of plastic surgery who saw her 2 weeks postop application of Theraskin #3. She had removed her dressing and asked her to apply silver alginate on alternate days and follow-up back with the wound center. Future debridements and application of skin substitute would have to be done in the hospital due to her high risk for anesthesia. READMISSION 04/17/2021 Patient is now a 51 year old woman that we  have had in this clinic for a prolonged period of time and 2016-2017 and then again for 2 visits in February 2018. At that point she had wounds on the right lower leg predominantly medial. She had also been seen by plastic surgery Dr. Leland Johns who I believe took her to the OR for operative debridement and application of TheraSkin in 2017. After she left our clinic she was followed for a very prolonged period of time in the wound care center in [facility] who then referred her ultimately to [facility] where she was seen by Dr. Vernona Rieger. Again taken her to the OR for skin grafting which apparently did not take. She had multiple other attempts at dressings although I have not really looked over all of these notes in great detail. She has not been seen in a wound care center in about a year. She states over the last year in addition to her right lower leg she has developed wounds on the left lower leg quite extensive. She is using Xeroform to all of these wounds without really any improvement. She also has Medicaid which does not cover wound products. The patient has had vascular work-ups in the past including most recently on 03/28/2021 showing biphasic waveforms on the right triphasic at the PTA and biphasic at the dorsalis pedis on the left. She was unable to tolerate any degree of compression to do ABIs. Unfortunately TBI's were also not done. She had venous reflux studies done in 2017. This did not show any evidence of a DVT or SVT and no venous incompetence was noted in the right leg at the time this was the only side with the wound As noted I did not look all over her old records. She apparently had a course of HBO and [facility] although I am not sure what the indication would have been. In any case she developed seizures and terminated treatment earlier. She is generally much more disabled than when we last saw her in clinic. She can no longer walk pretty much wheelchair-bound because predominantly of pain  in the left hip. 04/24/2021; the patient tolerated the wraps we put on. We used Santyl and Hydrofera Blue under compression. I brought her back for a nurse visit for a change in dressing. With Medicaid we will have a hard time getting anything paid for and hence the need for compression. She arrives in clinic with all the wounds looking somewhat better in terms of surface 12/20; circumferential wound on the right from the lateral to the medial. She has open areas on the left medial and left lateral x2 on all of  this with the same surface. This does not look completely healthy although she does have some epithelialization. She is not complaining of a lot of pain which is unusual for her sickle ulcers. I have not looked over her extensive records from [facility]. She had recent arterial studies and has a history of venous reflux studies I will need to look these over although I do not believe she has significant arterial disease 2023 05/22/2021; patient's wound areas measure slightly smaller. Still a lot of drainage coming from the right we have been using Hydrofera Blue and Santyl with some improvement in the wound surfaces. She tells me she will be getting transfused later in the week for her underlying sickle cell anemia I have looked over her recent arterial studies which were done in the fall. This was in November and showed biphasic and triphasic waveforms but she could not tolerate ABIs because of pressure and unfortunately TBI's were not done. She has not had recent venous reflux studies that I can see 1/10; not much change about the same surface area. This has a yellowish surface to it very gritty. We have been using Santyl and Hydrofera Blue for a prolonged period. Culture I did last week showed methicillin sensitive staph aureus "rare". Our intake nurse reports greenish drainage which may be the Hydrofera Blue itself 1/17; wounds are continue to measure smaller although I am not sure about the  accuracy here. Especially the areas on the right are covered in what looks to be a nonviable surface although she does have some epithelialization. Similarly she has areas on the left medial and left lateral ankle area which appear to have a better surface and perhaps are slightly smaller. We have been using Santyl and Hydrofera Blue. She cannot tolerate mechanical debridements She went for her reflux studies which showed significant reflux at the greater saphenous vein at the saphenofemoral junction as well as the greater saphenous vein in the proximal calf on the left she had reflux in the thigh and the common femoral vein and supra vein Fishel vein reflux in the greater saphenous vein. I will have vein and vascular look at this. My thoughts have been that these are likely sickle wounds. I looked through her old records from [facility] wound care center and then when she graduated to [facility] wound care center where she saw Dr. Zigmund Daniel and Dr. Vernona Rieger. Although I can see she had reflux studies done I do not see that she actually saw a vein and vascular. I went over the fact that she had operative debridements and actual skin grafting that did not take. I do not think these wounds have ever really progressed towards healing 1/31;Substantial wounds on the right ankle area. Hyper granulated very gritty adherent debris on the surface. 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 On the left she has no DVT or SVT deep vein reflux in the common femoral vein. Superficial vein reflux in the greater saphenous vein on although the vein diameter is  not really all that large. I do not think there is anything that can be done with these although  I am going to send her for consultation to vein and vascular. 2/7; Wound exam; substantial wound area on the right posterior ankle area and areas on the left medial ankle and left lateral ankle. I was able to debride the left medial ankle last week fairly aggressively and it is back this week to a completely nonviable surface She will see vascular surgery this Friday and I would like them to review the venous studies and also any comments on her arterial status. If they do not see an issue here I am going to refer her to plastic surgery for an operative debridement perhaps intraoperative ACell or Integra. Eventually she will require a deep tissue culture again 2/14; substantial wound area on the right posterior ankle, medial ankle. We have been using silver alginate The patient was seen by vein and vascular she had both venous reflux studies and arterial studies. 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 She had no evidence of superficial venous thrombosis. She did have superficial vein reflux at the saphenofemoral junction and the greater saphenous vein. On the left no evidence of a DVT no evidence of superficial venous throat thrombosis she did have deep vein reflux in the common femoral vein and superficial vein reflux in the greater saphenous vein but these were not felt to be amenable to ablation. In terms of arterial studies she had triphasic and wife biphasic wave waveforms bilaterally not felt to have a significant arterial issue. I do not get the feeling that they felt that any part of her nonhealing wounds were related to either arterial or venous issues. They did note that she had venous reflux at the right at the [facility] and [facility]. And also on the left there were reflux in the deep system at the common femoral vein and  greater saphenous vein in the proximal thigh. Nothing amenable to ablation. 2/20; she is making some decent progress on the right where there is nice skin between the 2 open areas on the right ankle. The surfaces here do not look viable yet there is some surrounding epithelialization. She still has a small area on the left medial ankle area. Hyper-granulated Jody's away always 2/28 patient has an appointment with plastic surgery on 3/8. We will see her back on 3/9. She may have to call us to get the area redressed. We've been using Santyl under silver alginate. We made a nice improvement on the left medial ankle. The larger wounds on the right also looks somewhat better in terms of epithelialization although I think they could benefit from an aggressive debridement if plastic surgery would be willing to do that. Perhaps placement of Integra or a cell 07/26/2021: She saw Dr. Claudia Desanctis yesterday. He raised the question as to whether or not this might be pyoderma and wanted to wait until that question was answered by dermatology before proceeding with any sort of operative debridement. We have continue to use Santyl under silver alginate with Kerlix and Coban wraps. Overall, her wounds appear to be continuing to contract and epithelialize, with some granulation tissue present. There continues to be some slough on all wound surfaces. Electronic Signature(s) Signed: 07/26/2021 10:38:10 AM By: Fredirick Maudlin MD FACS Entered By: Fredirick Maudlin on 07/26/2021 10:38:10 -------------------------------------------------------------------------------- Physical Exam Details Patient Name: Date of Service: [name] [name], FA [name] 07/26/2021 10:00 A M Medical Record Number: [id number] Patient Account Number: [id number] Date of Birth/Sex: Treating  RN: 01/07/1971 (51 y.o. F) Primary Care Provider: [name] Other Clinician: Referring Provider: Treating Provider/Extender: Elyse Jarvis Weeks in  Treatment: 14 Constitutional . . . . No acute distress. Respiratory Normal work of breathing on room air. Notes 07/26/2021: Wound examshe continues to have a fair amount of slough over all wound surfaces, but it is not particularly adherent and the underlying granulation tissue appears fairly healthy. Electronic Signature(s) Signed: 07/27/2021 7:09:11 AM By: Fredirick Maudlin MD FACS Entered By: Fredirick Maudlin on 07/27/2021 07:09:11 -------------------------------------------------------------------------------- Physician Orders Details Patient Name: Date of Service: [name] [name], FA [name] 07/26/2021 10:00 A M Medical Record Number: [id number] Patient Account Number: [id number] Date of Birth/Sex: Treating RN: 01-23-1971 (51 y.o. Tonita Phoenix, Lauren Primary Care Provider: [name] Other Clinician: Referring Provider: Treating Provider/Extender: Elyse Jarvis Weeks in Treatment: [phone] Verbal / Phone Orders: No Diagnosis Coding ICD-10 Coding Code Description L97.818 Non-pressure chronic ulcer of other part of right lower leg with other specified severity L97.828 Non-pressure chronic ulcer of other part of left lower leg with other specified severity 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 Follow-up Appointments ppointment in 1 week. - Dr. Celine Ahr Return A Other: - Dr. Claudia Desanctis referred to Dermatology...pt. has not been scheduled yet. Bathing/ Shower/ Hygiene May shower with protection but do not get wound dressing(s) wet. - Can get cast protector bags at [facility] or [facility] Edema Control - Lymphedema / SCD / Other Elevate legs to the level of the heart or above for 30 minutes daily and/or when sitting, a frequency of: - throughout the day Avoid standing for long periods of time. Exercise regularly Additional Orders / Instructions Follow Nutritious Diet Wound Treatment Wound #17 - Lower Leg Wound Laterality: Right, Lateral Cleanser: Soap and Water 2 x Per Week/30  Days Discharge Instructions: May shower and wash wound with dial antibacterial soap and water prior to dressing change. Cleanser: Wound Cleanser 2 x Per Week/30 Days Discharge Instructions: Cleanse the wound with wound cleanser prior to applying a clean dressing using gauze sponges, not tissue or cotton balls. Peri-Wound Care: Triamcinolone 15 (g) 2 x Per Week/30 Days Discharge Instructions: Use triamcinolone 15 (g) as directed Peri-Wound Care: Sween Lotion (Moisturizing lotion) 2 x Per Week/30 Days Discharge Instructions: Apply moisturizing lotion as directed Prim Dressing: KerraCel Ag Gelling Fiber Dressing, 4x5 in (silver alginate) 2 x Per Week/30 Days ary Discharge Instructions: Apply silver alginate to wound bed as instructed Prim Dressing: Santyl Ointment 2 x Per Week/30 Days ary Discharge Instructions: Apply nickel thick amount to wound bed as instructed Secondary Dressing: ABD Pad, 5x9 2 x Per Week/30 Days Discharge Instructions: Apply over primary dressing as directed. Secondary Dressing: Zetuvit Plus 4x8 in 2 x Per Week/30 Days Discharge Instructions: Apply over primary dressing as directed. Compression Wrap: Kerlix Roll 4.5x3.1 (in/yd) 2 x Per Week/30 Days Discharge Instructions: Apply Kerlix and Coban compression as directed. Compression Wrap: Coban Self-Adherent Wrap 4x5 (in/yd) 2 x Per Week/30 Days Discharge Instructions: Apply over Kerlix as directed. Wound #18 - Ankle Wound Laterality: Left, Posterior Cleanser: Soap and Water 2 x Per Week/30 Days Discharge Instructions: May shower and wash wound with dial antibacterial soap and water prior to dressing change. Cleanser: Wound Cleanser 2 x Per Week/30 Days Discharge Instructions: Cleanse the wound with wound cleanser prior to applying a clean dressing using gauze sponges, not tissue or cotton balls. Peri-Wound Care: Triamcinolone 15 (g) 2 x Per Week/30 Days Discharge Instructions: Use triamcinolone 15 (g) as  directed Peri-Wound Care: Sween  Lotion (Moisturizing lotion) 2 x Per Week/30 Days Discharge Instructions: Apply moisturizing lotion as directed Prim Dressing: KerraCel Ag Gelling Fiber Dressing, 4x5 in (silver alginate) 2 x Per Week/30 Days ary Discharge Instructions: Apply silver alginate to wound bed as instructed Secondary Dressing: Woven Gauze Sponge, Non-Sterile 4x4 in 2 x Per Week/30 Days Discharge Instructions: Apply over primary dressing as directed. Compression Wrap: Kerlix Roll 4.5x3.1 (in/yd) 2 x Per Week/30 Days Discharge Instructions: Apply Kerlix and Coban compression as directed. Compression Wrap: Coban Self-Adherent Wrap 4x5 (in/yd) 2 x Per Week/30 Days Discharge Instructions: Apply over Kerlix as directed. Wound #19 - Ankle Wound Laterality: Left, Medial Cleanser: Soap and Water 2 x Per Week/30 Days Discharge Instructions: May shower and wash wound with dial antibacterial soap and water prior to dressing change. Cleanser: Wound Cleanser 2 x Per Week/30 Days Discharge Instructions: Cleanse the wound with wound cleanser prior to applying a clean dressing using gauze sponges, not tissue or cotton balls. Peri-Wound Care: Triamcinolone 15 (g) 2 x Per Week/30 Days Discharge Instructions: Use triamcinolone 15 (g) as directed Peri-Wound Care: Sween Lotion (Moisturizing lotion) 2 x Per Week/30 Days Discharge Instructions: Apply moisturizing lotion as directed Prim Dressing: KerraCel Ag Gelling Fiber Dressing, 4x5 in (silver alginate) 2 x Per Week/30 Days ary Discharge Instructions: Apply silver alginate to wound bed as instructed Prim Dressing: Santyl Ointment 2 x Per Week/30 Days ary Discharge Instructions: Apply nickel thick amount to wound bed as instructed Secondary Dressing: ABD Pad, 5x9 2 x Per Week/30 Days Discharge Instructions: Apply over primary dressing as directed. Compression Wrap: Kerlix Roll 4.5x3.1 (in/yd) 2 x Per Week/30 Days Discharge Instructions: Apply Kerlix  and Coban compression as directed. Compression Wrap: Coban Self-Adherent Wrap 4x5 (in/yd) 2 x Per Week/30 Days Discharge Instructions: Apply over Kerlix as directed. Wound #21 - Ankle Wound Laterality: Right, Medial Cleanser: Soap and Water 2 x Per Week/30 Days Discharge Instructions: May shower and wash wound with dial antibacterial soap and water prior to dressing change. Cleanser: Wound Cleanser 2 x Per Week/30 Days Discharge Instructions: Cleanse the wound with wound cleanser prior to applying a clean dressing using gauze sponges, not tissue or cotton balls. Peri-Wound Care: Triamcinolone 15 (g) 2 x Per Week/30 Days Discharge Instructions: Use triamcinolone 15 (g) as directed Peri-Wound Care: Sween Lotion (Moisturizing lotion) 2 x Per Week/30 Days Discharge Instructions: Apply moisturizing lotion as directed Prim Dressing: KerraCel Ag Gelling Fiber Dressing, 4x5 in (silver alginate) 2 x Per Week/30 Days ary Discharge Instructions: Apply silver alginate to wound bed as instructed Secondary Dressing: ABD Pad, 5x9 2 x Per Week/30 Days Discharge Instructions: Apply over primary dressing as directed. Secondary Dressing: Zetuvit Plus 4x8 in 2 x Per Week/30 Days Discharge Instructions: Apply over primary dressing as directed. Compression Wrap: Kerlix Roll 4.5x3.1 (in/yd) 2 x Per Week/30 Days Discharge Instructions: Apply Kerlix and Coban compression as directed. Compression Wrap: Coban Self-Adherent Wrap 4x5 (in/yd) 2 x Per Week/30 Days Discharge Instructions: Apply over Kerlix as directed. Electronic Signature(s) Signed: 07/27/2021 8:31:39 AM By: Fredirick Maudlin MD FACS Previous Signature: 07/26/2021 5:18:04 PM Version By: Rhae Hammock RN Entered By: Fredirick Maudlin on 07/27/2021 07:10:08 -------------------------------------------------------------------------------- Problem List Details Patient Name: Date of Service: [name] [name], FA [name] 07/26/2021 10:00 A M Medical Record Number:  [id number] Patient Account Number: [id number] Date of Birth/Sex: Treating RN: April 06, 1971 (51 y.o. F) Primary Care Provider: [name] Other Clinician: Referring Provider: Treating Provider/Extender: Elyse Jarvis Weeks in Treatment: 14 Active Problems ICD-10 Encounter Code Description  Active Date MDM Diagnosis L97.818 Non-pressure chronic ulcer of other part of right lower leg with other specified 04/17/2021 No Yes severity L97.828 Non-pressure chronic ulcer of other part of left lower leg with other specified 04/17/2021 No Yes severity 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 04/17/2021 No Yes Inactive Problems Resolved Problems Electronic Signature(s) Signed: 07/27/2021 7:07:19 AM By: Fredirick Maudlin MD FACS Previous Signature: 07/26/2021 10:24:40 AM Version By: Fredirick Maudlin MD FACS Entered By: Fredirick Maudlin on 07/27/2021 07:07:19 -------------------------------------------------------------------------------- Progress Note Details Patient Name: Date of Service: [name] [name], FA [name] 07/26/2021 10:00 A M Medical Record Number: [id number] Patient Account Number: [id number] Date of Birth/Sex: Treating RN: [birth year] (51 y.o. F) Primary Care Provider: [name] Other Clinician: Referring Provider: Treating Provider/Extender: Elyse Jarvis Weeks in Treatment: 14 Subjective Chief Complaint Information obtained from Patient the patient is here for evaluation of her bilateral lower extremity sickle cell ulcers 04/17/2021; patient comes in for substantial wounds on the right and left lower leg History of Present Illness (HPI) The following HPI elements were documented for the patient's wound: Location: medial and lateral ankle region on the right and left medial malleolus Quality: Patient reports experiencing a shooting pain to affected area(s). Severity: Patient states wound(s) are getting worse. Duration: right lower extremity  bimalleolar ulcers have been present for approximately 2 years; the rright meedial malleolus ulcer has been there proximally 6 months Timing: Pain in wound is constant (hurts all the time) Context: The wound would happen gradually Associated Signs and Symptoms: Patient reports having increase discharge. 51 year old patient with a history of sickle cell anemia who was last seen by me with ulceration of the right lower extremity above the ankle and was referred to Dr. Leland Johns for a surgical debridement as I was unable to do anything in the office due to excruciating pain. At that stage she was referred from the plastic surgery service to dermatology who treated her for a skin infection with doxycycline and then Levaquin and a local antibiotic ointment. I understand the patient has since developed ulceration on the left ankle both medial and lateral and was now referred back to the wound center as dermatology has finished the management. I do not have any notes from the dermatology department Old notes: 51 year old patient with a history of sickle cell anemia, pain bilateral lower extremities, right lower extremity ulcer and has a history of receiving a skin graft( Theraskin) several months ago. She has been visiting the wound center [facility] and was seen by Dr. Dellia Nims and Dr. Leland Johns. after prolonged conservator therapy between July 2016 and January 2017. She had been seen by the plastic surgeon and taken to the OR for debridement and application of Theraskin. She had 3 applications of Theraskin and was then treated with collagen. Prior to that she had a history of similar problems in 2014 and was treated conservatively. Had a reflux study done for the right lower extremity in August 2016 without reflux or DVT . Past medical history significant for sickle cell disease, anemia, leg ulcers, cholelithiasis,and has never been a smoker. Once the patient was discharged on the wound center she says  within 2 or 3 weeks the problems recurred and she has been treating it conservatively. since I saw her 3 weeks ago at [facility] she has been unable to get her dressing material but has completed a course of doxycycline. 6/7/ 2017 -- lower extremity venous duplex reflux evaluation was done oo No evidence of SVT or DVT in the RLL.  No venous incompetence in the RLL. No further vascular workup is indicated at this time. She was seen by Dr. [name], on 10/04/2015. She agreed with the plan of taking her to the OR for debridement and application of theraskin and would also take biopsies to rule out pyoderma gangrenosum. Follow-up note dated May 31 received and she was status post application of Theraskin to multiple ulcers around the right ankle. Pathology did not show evidence of malignancy or pyoderma gangrenosum. She would continue to see as in the wound clinic for further care and see Dr. Leland Johns as needed. The patient brought the biopsy report and it was consistent with stasis ulcer no evidence of malignancy and the comment was that there was some adjacent neovascularization, fibrosis and patchy perivascular chronic inflammation. 11/15/2015 -- today we applied her first application of Theraskin 11/30/15; TheraSkin #2 12/13/2015 -- she is having a lot of pain locally and is here for possible application of a theraskin today. 01/16/2016 -- the patient has significant pain and has noticed despite in spite of all local care and oral pain medication. It is impossible to debride her in the office. 02/06/2016 -- I do not see any notes from Dr. Iran Planas( the patient has not made a call to the office know as she heard from them) and the only visit to recently was with her PCP Dr. Danella Penton -- I saw her on 01/16/2016 and prescribed 90 tablets of oxycodone 10 mg and did lab work and screening for HIV. the HIV was negative and hemoglobin was 6.3 with a WBC count of 14.9 and hematocrit of 17.8 with  platelets of 561. 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 The bimalleolar ulcers to the right lower extremity have been present for approximately 2 years, the left medial malleolus ulcer has been present approximately 6 months. She has followed with Dr.Thimmappa in the past and has had a total of 3 applications of Theraskin (01/2015, 09/2015, 06/17/16). She has also followed with Dr. Con Memos here in the clinic and has received 2 applications of TheraSkin (11/10/15, 11/30/15). The patient does experience chronic, and is not amenable to debridement. 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 She is not currently on any antibiotic therapy and has not been treated with any recently. 07/17/2016 -- was seen by Dr. Iran Planas of plastic surgery who saw her 2 weeks postop application of Theraskin #3. She had removed her dressing and asked her to apply silver alginate on alternate days and follow-up back with the wound center. Future debridements and application of skin substitute would have to be done in the hospital due to her high risk for anesthesia. READMISSION 04/17/2021 Patient is now a 51 year old woman that we have had in this clinic for a prolonged period of time and 2016-2017 and then again for 2 visits in February 2018. At that point she had wounds on the right lower leg predominantly medial. She had also been seen by plastic surgery Dr. Leland Johns who I believe took her to the OR for operative debridement and application of TheraSkin in 2017. After she left our clinic she was followed for a very prolonged period of time in the wound care center in [facility] who then referred her ultimately to [facility] where she was seen by Dr. Vernona Rieger. Again taken her to the OR for skin grafting which apparently did  not take. She had multiple other attempts at  dressings although I have not really looked over all of these notes in great detail. She has not been seen in a wound care center in about a year. She states over the last year in addition to her right lower leg she has developed wounds on the left lower leg quite extensive. She is using Xeroform to all of these wounds without really any improvement. She also has Medicaid which does not cover wound products. The patient has had vascular work-ups in the past including most recently on 03/28/2021 showing biphasic waveforms on the right triphasic at the PTA and biphasic at the dorsalis pedis on the left. She was unable to tolerate any degree of compression to do ABIs. Unfortunately TBI's were also not done. She had venous reflux studies done in 2017. This did not show any evidence of a DVT or SVT and no venous incompetence was noted in the right leg at the time this was the only side with the wound As noted I did not look all over her old records. She apparently had a course of HBO and [facility] although I am not sure what the indication would have been. In any case she developed seizures and terminated treatment earlier. She is generally much more disabled than when we last saw her in clinic. She can no longer walk pretty much wheelchair-bound because predominantly of pain in the left hip. 04/24/2021; the patient tolerated the wraps we put on. We used Santyl and Hydrofera Blue under compression. I brought her back for a nurse visit for a change in dressing. With Medicaid we will have a hard time getting anything paid for and hence the need for compression. She arrives in clinic with all the wounds looking somewhat better in terms of surface 12/20; circumferential wound on the right from the lateral to the medial. She has open areas on the left medial and left lateral x2 on all of this with the same surface. This does not look completely healthy although she does have some epithelialization. She is not  complaining of a lot of pain which is unusual for her sickle ulcers. I have not looked over her extensive records from [facility]. She had recent arterial studies and has a history of venous reflux studies I will need to look these over although I do not believe she has significant arterial disease 2023 05/22/2021; patient's wound areas measure slightly smaller. Still a lot of drainage coming from the right we have been using Hydrofera Blue and Santyl with some improvement in the wound surfaces. She tells me she will be getting transfused later in the week for her underlying sickle cell anemia I have looked over her recent arterial studies which were done in the fall. This was in November and showed biphasic and triphasic waveforms but she could not tolerate ABIs because of pressure and unfortunately TBI's were not done. She has not had recent venous reflux studies that I can see 1/10; not much change about the same surface area. This has a yellowish surface to it very gritty. We have been using Santyl and Hydrofera Blue for a prolonged period. Culture I did last week showed methicillin sensitive staph aureus "rare". Our intake nurse reports greenish drainage which may be the Hydrofera Blue itself 1/17; wounds are continue to measure smaller although I am not sure about the accuracy here. Especially the areas on the right are covered in what looks to be a nonviable surface although she  does have some epithelialization. Similarly she has areas on the left medial and left lateral ankle area which appear to have a better surface and perhaps are slightly smaller. We have been using Santyl and Hydrofera Blue. She cannot tolerate mechanical debridements She went for her reflux studies which showed significant reflux at the greater saphenous vein at the saphenofemoral junction as well as the greater saphenous vein in the proximal calf on the left she had reflux in the thigh and the common femoral vein and supra  vein Fishel vein reflux in the greater saphenous vein. I will have vein and vascular look at this. My thoughts have been that these are likely sickle wounds. I looked through her old records from [facility] wound care center and then when she graduated to [facility] wound care center where she saw Dr. Zigmund Daniel and Dr. Vernona Rieger. Although I can see she had reflux studies done I do not see that she actually saw a vein and vascular. I went over the fact that she had operative debridements and actual skin grafting that did not take. I do not think these wounds have ever really progressed towards healing 1/31;Substantial wounds on the right ankle area. Hyper granulated very gritty adherent debris on the surface. 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 On the left she has no DVT or SVT deep vein reflux in the common femoral vein. Superficial vein reflux in the greater saphenous vein on although the vein diameter is not really all that large. I do not think there is anything that can be done with these although I am going to send her for consultation to vein and vascular. 2/7; Wound exam; substantial wound area on the right posterior ankle area and areas on the left medial ankle and left lateral ankle. I was able to debride the left medial ankle last week fairly aggressively and it is back this week to a completely nonviable surface She will see vascular surgery this Friday and I would like them to review the venous studies and also any comments on her arterial status. If they do not see an issue here I am going to refer her to plastic surgery for  an operative debridement perhaps intraoperative ACell or Integra. Eventually she will require a deep tissue culture again 2/14; substantial wound area on the right posterior ankle, medial ankle. We have been using silver alginate The patient was seen by vein and vascular she had both venous reflux studies and arterial studies. 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 She had no evidence of superficial venous thrombosis. She did have superficial vein reflux at the saphenofemoral junction and the greater saphenous vein. On the left no evidence of a DVT no evidence of superficial venous throat thrombosis she did have deep vein reflux in the common femoral vein and superficial vein reflux in the greater saphenous vein but these were not felt to be amenable to ablation. In terms of arterial studies she had triphasic and wife biphasic wave waveforms bilaterally not felt to have  a significant arterial issue. I do not get the feeling that they felt that any part of her nonhealing wounds were related to either arterial or venous issues. They did note that she had venous reflux at the right at the [facility] and [facility]. And also on the left there were reflux in the deep system at the common femoral vein and greater saphenous vein in the proximal thigh. Nothing amenable to ablation. 2/20; she is making some decent progress on the right where there is nice skin between the 2 open areas on the right ankle. The surfaces here do not look viable yet there is some surrounding epithelialization. She still has a small area on the left medial ankle area. Hyper-granulated Jody's away always 2/28 patient has an appointment with plastic surgery on 3/8. We will see her back on 3/9. She may have to call us to get the area redressed. We've been using Santyl under silver alginate. We made a nice improvement on the left medial ankle. The larger wounds on the  right also looks somewhat better in terms of epithelialization although I think they could benefit from an aggressive debridement if plastic surgery would be willing to do that. Perhaps placement of Integra or a cell 07/26/2021: She saw Dr. Claudia Desanctis yesterday. He raised the question as to whether or not this might be pyoderma and wanted to wait until that question was answered by dermatology before proceeding with any sort of operative debridement. We have continue to use Santyl under silver alginate with Kerlix and Coban wraps. Overall, her wounds appear to be continuing to contract and epithelialize, with some granulation tissue present. There continues to be some slough on all wound surfaces. Patient History Information obtained from Patient. Family History Diabetes - Mother, Lung Disease - Mother, No family history of Cancer, Heart Disease, Hereditary Spherocytosis, Hypertension, Kidney Disease, Seizures, Stroke, Thyroid Problems, Tuberculosis. Social History Never smoker, Marital Status - Married, Alcohol Use - Never, Drug Use - No History, Caffeine Use - Daily. Medical History Eyes Denies history of Cataracts, Glaucoma, Optic Neuritis Ear/Nose/Mouth/Throat Denies history of Chronic sinus problems/congestion, Middle ear problems Hematologic/Lymphatic Patient has history of Anemia, Sickle Cell Disease Denies history of Hemophilia, Human Immunodeficiency Virus, Lymphedema Respiratory Denies history of Aspiration, Asthma, Chronic Obstructive Pulmonary Disease (COPD), Pneumothorax, Sleep Apnea, Tuberculosis Cardiovascular Denies history of Angina, Arrhythmia, Congestive Heart Failure, Coronary Artery Disease, Deep Vein Thrombosis, Hypertension, Hypotension, Myocardial Infarction, Peripheral Arterial Disease, Peripheral Venous Disease, Phlebitis, Vasculitis Gastrointestinal Denies history of Cirrhosis , Colitis, Crohnoos, Hepatitis A, Hepatitis B, Hepatitis C Endocrine Denies history of Type  I Diabetes, Type II Diabetes Genitourinary Denies history of End Stage Renal Disease Immunological Denies history of Lupus Erythematosus, Raynaudoos, Scleroderma Integumentary (Skin) Denies history of History of Burn Musculoskeletal Denies history of Gout, Rheumatoid Arthritis, Osteoarthritis, Osteomyelitis Neurologic Patient has history of Neuropathy - right foot intermittant Denies history of Dementia, Quadriplegia, Paraplegia, Seizure Disorder Oncologic Denies history of Received Chemotherapy, Received Radiation Psychiatric Denies history of Anorexia/bulimia, Confinement Anxiety Hospitalization/Surgery History - c section x2. - left breast lumpectomy. - iandD right ankle with theraskin. Medical A Surgical History Notes nd Constitutional Symptoms (General Health) H/O miscarriage Cardiovascular bradycardia Gastrointestinal cholilithiasis Objective Constitutional No acute distress. Vitals Time Taken: 9:48 AM, Height: 67 in, Temperature: 98.7 F, Pulse: 75 bpm, Respiratory Rate: 17 breaths/min, Blood Pressure: 134/74 mmHg. Respiratory Normal work of breathing on room air. General Notes: 07/26/2021: Wound examooshe continues to have a fair amount of slough over all wound surfaces, but it is  not particularly adherent and the underlying granulation tissue appears fairly healthy. Integumentary (Hair, Skin) Wound #17 status is Open. Original cause of wound was Gradually Appeared. The date acquired was: 10/05/2012. The wound has been in treatment 14 weeks. The wound is located on the Right,Lateral Lower Leg. The wound measures 9.5cm length x 5.5cm width x 0.1cm depth; 41.037cm^2 area and 4.104cm^3 volume. There is Fat Layer (Subcutaneous Tissue) exposed. There is no tunneling or undermining noted. There is a large amount of purulent drainage noted. There is small (1-33%) pink granulation within the wound bed. There is a large (67-100%) amount of necrotic tissue within the wound bed  including Adherent Slough. Wound #18 status is Open. Original cause of wound was Gradually Appeared. The date acquired was: 12/25/2020. The wound has been in treatment 14 weeks. The wound is located on the Left,Posterior Ankle. The wound measures 0.4cm length x 0.4cm width x 0.1cm depth; 0.126cm^2 area and 0.013cm^3 volume. There is Fat Layer (Subcutaneous Tissue) exposed. There is no tunneling or undermining noted. There is a medium amount of serosanguineous drainage noted. The wound margin is flat and intact. There is large (67-100%) red, pink granulation within the wound bed. There is no necrotic tissue within the wound bed. Wound #19 status is Open. Original cause of wound was Gradually Appeared. The date acquired was: 12/25/2020. The wound has been in treatment 14 weeks. The wound is located on the Left,Medial Ankle. The wound measures 1.8cm length x 1cm width x 0.1cm depth; 1.414cm^2 area and 0.141cm^3 volume. There is Fat Layer (Subcutaneous Tissue) exposed. There is no tunneling or undermining noted. There is a medium amount of serosanguineous drainage noted. The wound margin is flat and intact. There is large (67-100%) red granulation within the wound bed. There is a small (1-33%) amount of necrotic tissue within the wound bed including Adherent Slough. Wound #21 status is Open. Original cause of wound was Gradually Appeared. The date acquired was: 06/26/2021. The wound has been in treatment 4 weeks. The wound is located on the Right,Medial Ankle. The wound measures 6.5cm length x 3.2cm width x 0.1cm depth; 16.336cm^2 area and 1.634cm^3 volume. There is Fat Layer (Subcutaneous Tissue) exposed. There is no tunneling or undermining noted. There is a large amount of purulent drainage noted. The wound margin is distinct with the outline attached to the wound base. There is medium (34-66%) red granulation within the wound bed. There is a medium (34-66%) amount of necrotic tissue within the wound bed  including Adherent Slough. 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 There was a Excisional Skin/Subcutaneous Tissue Debridement with a total area of 30 sq cm performed by Fredirick Maudlin, MD. With the following instrument(s): Curette to remove Viable and Non-Viable tissue/material. Material removed includes Subcutaneous Tissue and Slough and after achieving pain control using Lidocaine. No specimens were taken. A time out was conducted at 10:15, prior to the start of the procedure. A Minimum amount of bleeding was controlled with Pressure. The procedure was tolerated well with a pain level of 0 throughout and a pain level of 0 following the procedure. Post Debridement Measurements: 9.5cm length x 5.5cm width x 0.1cm depth; 4.104cm^3 volume. Character of Wound/Ulcer Post Debridement is improved. Post  procedure Diagnosis Wound #17: Same as Pre-Procedure Wound #18 Pre-procedure diagnosis of Wound #18 is a Sickle Cell Lesion located on the Left,Posterior Ankle . There was a Excisional Skin/Subcutaneous Tissue Debridement with a total area of 0.16 sq cm performed by Fredirick Maudlin, MD. With the following instrument(s): Curette to remove Viable and Non-Viable tissue/material. Material removed includes Subcutaneous Tissue and Slough and after achieving pain control using Lidocaine. No specimens were taken. A time out was conducted at 10:15, prior to the start of the procedure. A Minimum amount of bleeding was controlled with Pressure. The procedure was tolerated well with a pain level of 0 throughout and a pain level of 0 following the procedure. Post Debridement  Measurements: 0.4cm length x 0.4cm width x 0.1cm depth; 0.013cm^3 volume. Character of Wound/Ulcer Post Debridement is improved. Post procedure Diagnosis Wound #18: Same as Pre-Procedure Wound #19 Pre-procedure diagnosis of Wound #19 is a Sickle Cell Lesion located on the Left,Medial Ankle . There was a Excisional Skin/Subcutaneous Tissue Debridement with a total area of 1.8 sq cm performed by Fredirick Maudlin, MD. With the following instrument(s): Curette to remove Viable and Non-Viable tissue/material. Material removed includes Subcutaneous Tissue and Slough and after achieving pain control using Lidocaine. No specimens were taken. A time out was conducted at 10:15, prior to the start of the procedure. A Minimum amount of bleeding was controlled with Pressure. The procedure was tolerated well with a pain level of 0 throughout and a pain level of 0 following the procedure. Post Debridement Measurements: 1.9cm length x 1cm width x 0.1cm depth; 0.149cm^3 volume. Character of Wound/Ulcer Post Debridement is improved. Post procedure Diagnosis Wound #19: Same as Pre-Procedure Wound #21 Pre-procedure diagnosis of Wound #21 is a Sickle Cell Lesion located on the Right,Medial Ankle .Severity of Tissue Pre Debridement is: Fat layer exposed. There was a Excisional Skin/Subcutaneous Tissue Debridement with a total area of 10 sq cm performed by Fredirick Maudlin, MD. With the following instrument(s): Curette to remove Viable and Non-Viable tissue/material. Material removed includes Subcutaneous Tissue and Slough and after achieving pain control using Lidocaine. No specimens were taken. A time out was conducted at 10:15, prior to the start of the procedure. A Minimum amount of bleeding was controlled with Pressure. The procedure was tolerated well with a pain level of 0 throughout and a pain level of 0 following the procedure. Post Debridement Measurements: 6.5cm length x 3.2cm width x 0.1cm depth; 1.634cm^3  volume. Character of Wound/Ulcer Post Debridement is improved. Severity of Tissue Post Debridement is: Fat layer exposed. Post procedure Diagnosis Wound #21: Same as Pre-Procedure Plan Follow-up Appointments: Return Appointment in 1 week. - Dr. Celine Ahr Other: - Dr. Claudia Desanctis referred to Dermatology...pt. has not been scheduled yet. Bathing/ Shower/ Hygiene: May shower with protection but do not get wound dressing(s) wet. - Can get cast protector bags at [facility] or [facility] Edema Control - Lymphedema / SCD / Other: Elevate legs to the level of the heart or above for 30 minutes daily and/or when sitting, a frequency of: - throughout the day Avoid standing for long periods of time. Exercise regularly Additional Orders / Instructions: Follow Nutritious Diet WOUND #17: - Lower Leg Wound Laterality: Right, Lateral Cleanser: Soap and Water 2 x Per Week/30 Days Discharge Instructions: May shower and wash wound with dial antibacterial soap and water prior to dressing change. Cleanser: Wound Cleanser 2 x Per Week/30 Days Discharge Instructions: Cleanse the wound with wound cleanser prior to applying a clean dressing using gauze sponges, not tissue  or cotton balls. Peri-Wound Care: Triamcinolone 15 (g) 2 x Per Week/30 Days Discharge Instructions: Use triamcinolone 15 (g) as directed Peri-Wound Care: Sween Lotion (Moisturizing lotion) 2 x Per Week/30 Days Discharge Instructions: Apply moisturizing lotion as directed Prim Dressing: KerraCel Ag Gelling Fiber Dressing, 4x5 in (silver alginate) 2 x Per Week/30 Days ary Discharge Instructions: Apply silver alginate to wound bed as instructed Prim Dressing: Santyl Ointment 2 x Per Week/30 Days ary Discharge Instructions: Apply nickel thick amount to wound bed as instructed Secondary Dressing: ABD Pad, 5x9 2 x Per Week/30 Days Discharge Instructions: Apply over primary dressing as directed. Secondary Dressing: Zetuvit Plus 4x8 in 2 x Per Week/30 Days Discharge  Instructions: Apply over primary dressing as directed. Com pression Wrap: Kerlix Roll 4.5x3.1 (in/yd) 2 x Per Week/30 Days Discharge Instructions: Apply Kerlix and Coban compression as directed. Com pression Wrap: Coban Self-Adherent Wrap 4x5 (in/yd) 2 x Per Week/30 Days Discharge Instructions: Apply over Kerlix as directed. WOUND #18: - Ankle Wound Laterality: Left, Posterior Cleanser: Soap and Water 2 x Per Week/30 Days Discharge Instructions: May shower and wash wound with dial antibacterial soap and water prior to dressing change. Cleanser: Wound Cleanser 2 x Per Week/30 Days Discharge Instructions: Cleanse the wound with wound cleanser prior to applying a clean dressing using gauze sponges, not tissue or cotton balls. Peri-Wound Care: Triamcinolone 15 (g) 2 x Per Week/30 Days Discharge Instructions: Use triamcinolone 15 (g) as directed Peri-Wound Care: Sween Lotion (Moisturizing lotion) 2 x Per Week/30 Days Discharge Instructions: Apply moisturizing lotion as directed Prim Dressing: KerraCel Ag Gelling Fiber Dressing, 4x5 in (silver alginate) 2 x Per Week/30 Days ary Discharge Instructions: Apply silver alginate to wound bed as instructed Secondary Dressing: Woven Gauze Sponge, Non-Sterile 4x4 in 2 x Per Week/30 Days Discharge Instructions: Apply over primary dressing as directed. Com pression Wrap: Kerlix Roll 4.5x3.1 (in/yd) 2 x Per Week/30 Days Discharge Instructions: Apply Kerlix and Coban compression as directed. Com pression Wrap: Coban Self-Adherent Wrap 4x5 (in/yd) 2 x Per Week/30 Days Discharge Instructions: Apply over Kerlix as directed. WOUND #19: - Ankle Wound Laterality: Left, Medial Cleanser: Soap and Water 2 x Per Week/30 Days Discharge Instructions: May shower and wash wound with dial antibacterial soap and water prior to dressing change. Cleanser: Wound Cleanser 2 x Per Week/30 Days Discharge Instructions: Cleanse the wound with wound cleanser prior to applying a  clean dressing using gauze sponges, not tissue or cotton balls. Peri-Wound Care: Triamcinolone 15 (g) 2 x Per Week/30 Days Discharge Instructions: Use triamcinolone 15 (g) as directed Peri-Wound Care: Sween Lotion (Moisturizing lotion) 2 x Per Week/30 Days Discharge Instructions: Apply moisturizing lotion as directed Prim Dressing: KerraCel Ag Gelling Fiber Dressing, 4x5 in (silver alginate) 2 x Per Week/30 Days ary Discharge Instructions: Apply silver alginate to wound bed as instructed Prim Dressing: Santyl Ointment 2 x Per Week/30 Days ary Discharge Instructions: Apply nickel thick amount to wound bed as instructed Secondary Dressing: ABD Pad, 5x9 2 x Per Week/30 Days Discharge Instructions: Apply over primary dressing as directed. Com pression Wrap: Kerlix Roll 4.5x3.1 (in/yd) 2 x Per Week/30 Days Discharge Instructions: Apply Kerlix and Coban compression as directed. Com pression Wrap: Coban Self-Adherent Wrap 4x5 (in/yd) 2 x Per Week/30 Days Discharge Instructions: Apply over Kerlix as directed. WOUND #21: - Ankle Wound Laterality: Right, Medial Cleanser: Soap and Water 2 x Per Week/30 Days Discharge Instructions: May shower and wash wound with dial antibacterial soap and water prior to dressing change. Cleanser: Wound Cleanser  2 x Per Week/30 Days Discharge Instructions: Cleanse the wound with wound cleanser prior to applying a clean dressing using gauze sponges, not tissue or cotton balls. Peri-Wound Care: Triamcinolone 15 (g) 2 x Per Week/30 Days Discharge Instructions: Use triamcinolone 15 (g) as directed Peri-Wound Care: Sween Lotion (Moisturizing lotion) 2 x Per Week/30 Days Discharge Instructions: Apply moisturizing lotion as directed Prim Dressing: KerraCel Ag Gelling Fiber Dressing, 4x5 in (silver alginate) 2 x Per Week/30 Days ary Discharge Instructions: Apply silver alginate to wound bed as instructed Secondary Dressing: ABD Pad, 5x9 2 x Per Week/30 Days Discharge  Instructions: Apply over primary dressing as directed. Secondary Dressing: Zetuvit Plus 4x8 in 2 x Per Week/30 Days Discharge Instructions: Apply over primary dressing as directed. Com pression Wrap: Kerlix Roll 4.5x3.1 (in/yd) 2 x Per Week/30 Days Discharge Instructions: Apply Kerlix and Coban compression as directed. Com pression Wrap: Coban Self-Adherent Wrap 4x5 (in/yd) 2 x Per Week/30 Days Discharge Instructions: Apply over Kerlix as directed. 07/26/2021: She saw Dr. Claudia Desanctis from plastic surgery who referred her to dermatology to confirm that this is not pyoderma gangrenosum, prior to proceeding with any operative debridement. She continues to have a fair amount of slough over all wound surfaces, but it is not particularly adherent and the underlying granulation tissue appears fairly healthy. I debrided the slough today. She did complain of some increased drainage so we will add Zetuvit to her dressing to try and manage this a little bit better. Continue Santyl under silver alginate with Kerlix and Coban wraps. Follow-up in 1 week. Electronic Signature(s) Signed: 07/27/2021 7:11:23 AM By: Fredirick Maudlin MD FACS Entered By: Fredirick Maudlin on 07/27/2021 07:11:23 -------------------------------------------------------------------------------- HxROS Details Patient Name: Date of Service: [name] [name], FA [name] 07/26/2021 10:00 A M Medical Record Number: [id number] Patient Account Number: [id number] Date of Birth/Sex: Treating RN: Feb 26, 1971 (51 y.o. F) Primary Care Provider: [name] Other Clinician: Referring Provider: Treating Provider/Extender: Elyse Jarvis Weeks in Treatment: 14 Information Obtained From Patient Constitutional Symptoms (General Health) Medical History: Past Medical History Notes: H/O miscarriage Eyes Medical History: Negative for: Cataracts; Glaucoma; Optic Neuritis Ear/Nose/Mouth/Throat Medical History: Negative for: Chronic sinus  problems/congestion; Middle ear problems Hematologic/Lymphatic Medical History: Positive for: Anemia; Sickle Cell Disease Negative for: Hemophilia; Human Immunodeficiency Virus; Lymphedema Respiratory Medical History: Negative for: Aspiration; Asthma; Chronic Obstructive Pulmonary Disease (COPD); Pneumothorax; Sleep Apnea; Tuberculosis Cardiovascular Medical History: Negative for: Angina; Arrhythmia; Congestive Heart Failure; Coronary Artery Disease; Deep Vein Thrombosis; Hypertension; Hypotension; Myocardial Infarction; Peripheral Arterial Disease; Peripheral Venous Disease; Phlebitis; Vasculitis Past Medical History Notes: bradycardia Gastrointestinal Medical History: Negative for: Cirrhosis ; Colitis; Crohns; Hepatitis A; Hepatitis B; Hepatitis C Past Medical History Notes: cholilithiasis Endocrine Medical History: Negative for: Type I Diabetes; Type II Diabetes Genitourinary Medical History: Negative for: End Stage Renal Disease Immunological Medical History: Negative for: Lupus Erythematosus; Raynauds; Scleroderma Integumentary (Skin) Medical History: Negative for: History of Burn Musculoskeletal Medical History: Negative for: Gout; Rheumatoid Arthritis; Osteoarthritis; Osteomyelitis Neurologic Medical History: Positive for: Neuropathy - right foot intermittant Negative for: Dementia; Quadriplegia; Paraplegia; Seizure Disorder Oncologic Medical History: Negative for: Received Chemotherapy; Received Radiation Psychiatric Medical History: Negative for: Anorexia/bulimia; Confinement Anxiety Immunizations Pneumococcal Vaccine: Received Pneumococcal Vaccination: No Implantable Devices None Hospitalization / Surgery History Type of Hospitalization/Surgery c section x2 left breast lumpectomy iandD right ankle with theraskin Family and Social History Cancer: No; Diabetes: Yes - Mother; Heart Disease: No; Hereditary Spherocytosis: No; Hypertension: No; Kidney  Disease: No; Lung Disease: Yes - Mother; Seizures: No; Stroke:  No; Thyroid Problems: No; Tuberculosis: No; Never smoker; Marital Status - Married; Alcohol Use: Never; Drug Use: No History; Caffeine Use: Daily; Financial Concerns: No; Food, Clothing or Shelter Needs: No; Support System Lacking: No; Transportation Concerns: No Electronic Signature(s) Signed: 07/27/2021 8:31:39 AM By: Fredirick Maudlin MD FACS Signed: 07/27/2021 8:31:39 AM By: Fredirick Maudlin MD FACS Entered By: Fredirick Maudlin on 07/27/2021 07:08:02 -------------------------------------------------------------------------------- SuperBill Details Patient Name: Date of Service: [name] [name], FA [name] 07/26/2021 Medical Record Number: [id number] Patient Account Number: [id number] Date of Birth/Sex: Treating RN: 1970-08-29 (51 y.o. 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 Facility Procedures CPT4 Code: 11941740 Description: 81448 - DEB SUBQ TISSUE 20 SQ CM/< ICD-10 Diagnosis Description L97.818 Non-pressure chronic ulcer of other part of right lower leg with other specified L97.828 Non-pressure chronic ulcer of other part of left lower leg with other specified  s Modifier: severity everity Quantity: 1 CPT4 Code: [id number] Description: 70263 - DEB SUBQ TISS EA ADDL 20CM ICD-10 Diagnosis Description L97.818 Non-pressure chronic ulcer of other part of right lower leg with other specified L97.828 Non-pressure chronic ulcer of other part of left lower leg with other specified  s Modifier: severity everity Quantity:  2 Physician Procedures : CPT4 Code Description Modifier [id number] 11042 - WC PHYS SUBQ TISS 20 SQ CM ICD-10 Diagnosis Description L97.818 Non-pressure chronic ulcer of other part of right lower leg with other specified severity L97.828 Non-pressure chronic ulcer of other part of  left lower leg with other specified severity Quantity: 1 : [id number] 78676 - WC PHYS SUBQ TISS EA ADDL 20 CM ICD-10 Diagnosis Description L97.818 Non-pressure chronic ulcer of other part of right lower leg with other specified severity L97.828 Non-pressure chronic ulcer of other part of left lower leg with other  specified severity Quantity: 2 Electronic Signature(s) Signed: 07/27/2021 7:11:32 AM By: Fredirick Maudlin MD FACS Previous Signature: 07/26/2021 5:18:04 PM Version By: Rhae Hammock RN Entered By: Fredirick Maudlin on 07/27/2021 07:11:31

## 2021-07-27 NOTE — Telephone Encounter (Signed)
No additional notes needed  

## 2021-08-02 ENCOUNTER — Encounter (HOSPITAL_BASED_OUTPATIENT_CLINIC_OR_DEPARTMENT_OTHER): Payer: Medicaid Other | Admitting: General Surgery

## 2021-08-03 ENCOUNTER — Other Ambulatory Visit: Payer: Self-pay

## 2021-08-03 ENCOUNTER — Encounter (HOSPITAL_BASED_OUTPATIENT_CLINIC_OR_DEPARTMENT_OTHER): Payer: Medicaid Other | Admitting: General Surgery

## 2021-08-03 DIAGNOSIS — L97818 Non-pressure chronic ulcer of other part of right lower leg with other specified severity: Secondary | ICD-10-CM | POA: Diagnosis not present

## 2021-08-03 NOTE — Progress Notes (Signed)
DAYELIN, BALDUCCI ([id number]) ?Visit Report for 08/03/2021 ?SuperBill Details ?Patient Name: Date of Service: ?[name], [place] [name] 08/03/2021 ?Medical Record Number: [id number] ?Patient Account Number: [id number] ?Date of Birth/Sex: Treating RN: ?12-29-1970 (51 y.o. 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

## 2021-08-03 NOTE — Progress Notes (Signed)
NESTA, SCATURRO ([id number]) ?Visit Report for 08/03/2021 ?Arrival Information Details ?Patient Name: Date of Service: ?[name], [place] [name] 08/03/2021 11:15 A M ?Medical Record Number: [id number] ?Patient Account Number: [id number] ?Date of Birth/Sex: Treating RN: ?04-29-1971 (51 y.o. [name], [name] [name], [name] ?Primary Care Isiaih Hollenbach: [name] Other Clinician: ?Referring Ellar Hakala: ?Treating Brad Lieurance/Extender: Fredirick Maudlin ?[name] ?Weeks in Treatment: 15 ?Visit Information History Since Last Visit ?Added or deleted any medications: No ?Patient Arrived: Wheel Chair ?Any new allergies or adverse reactions: No ?Arrival Time: 11:30 ?Had a fall or experienced change in No ?Accompanied By: alone ?activities of daily living that may affect ?Transfer Assistance: Manual ?risk of falls: ?Patient Identification Verified: Yes ?Signs or symptoms of abuse/neglect since last visito No ?Secondary Verification Process Completed: Yes ?Hospitalized since last visit: No ?Patient Requires Transmission-Based Precautions: No ?Implantable device outside of the clinic excluding No ?Patient Has Alerts: No ?cellular tissue based products placed in the center ?since last visit: ?Has Dressing in Place as Prescribed: Yes ?Has Compression in Place as Prescribed: Yes ?Pain Present Now: No ?Electronic Signature(s) ?Signed: 08/03/2021 1:42:34 PM By: Levan Hurst RN, BSN ?Entered By: Levan Hurst on 08/03/2021 12:31:30 ?-------------------------------------------------------------------------------- ?Clinic Level of Care Assessment Details ?Patient Name: Date of Service: ?[name], [place] [name] 08/03/2021 11:15 A M ?Medical Record Number: [id number] ?Patient Account Number: [id number] ?Date of Birth/Sex: Treating RN: ?[birth year] (51 y.o. [name], [name] Sprague, [name] ?Primary Care Henning Ehle: [name] Other Clinician: ?Referring Ileta Ofarrell: ?Treating Alexee Delsanto/Extender: Fredirick Maudlin ?[name] ?Weeks in Treatment: 15 ?Clinic Level of Care Assessment  Items ?TOOL 4 Quantity Score ?X- 1 0 ?Use when only an EandM is performed on FOLLOW-UP visit ?ASSESSMENTS - Nursing Assessment / Reassessment ?X- 1 10 ?Reassessment of Co-morbidities (includes updates in patient status) ?X- 1 5 ?Reassessment of Adherence to Treatment Plan ?ASSESSMENTS - Wound and Skin A ssessment / Reassessment ?'[]'$  - 0 ?Simple Wound Assessment / Reassessment - one wound ?X- 4 5 ?Complex Wound Assessment / Reassessment - multiple wounds ?'[]'$  - 0 ?Dermatologic / Skin Assessment (not related to wound area) ?ASSESSMENTS - Focused Assessment ?'[]'$  - 0 ?Circumferential Edema Measurements - multi extremities ?'[]'$  - 0 ?Nutritional Assessment / Counseling / Intervention ?'[]'$  - 0 ?Lower Extremity Assessment (monofilament, tuning fork, pulses) ?'[]'$  - 0 ?Peripheral Arterial Disease Assessment (using hand held doppler) ?ASSESSMENTS - Ostomy and/or Continence Assessment and Care ?'[]'$  - 0 ?Incontinence Assessment and Management ?'[]'$  - 0 ?Ostomy Care Assessment and Management (repouching, etc.) ?PROCESS - Coordination of Care ?X - Simple Patient / Family Education for ongoing care 1 15 ?'[]'$  - 0 ?Complex (extensive) Patient / Family Education for ongoing care ?X- 1 10 ?Staff obtains Consents, Records, T Results / Process Orders ?est ?'[]'$  - 0 ?Staff telephones HHA, Nursing Homes / Clarify orders / etc ?'[]'$  - 0 ?Routine Transfer to another Facility (non-emergent condition) ?'[]'$  - 0 ?Routine Hospital Admission (non-emergent condition) ?'[]'$  - 0 ?New Admissions / Biomedical engineer / Ordering NPWT Apligraf, etc. ?, ?'[]'$  - 0 ?Emergency Hospital Admission (emergent condition) ?X- 1 10 ?Simple Discharge Coordination ?'[]'$  - 0 ?Complex (extensive) Discharge Coordination ?PROCESS - Special Needs ?'[]'$  - 0 ?Pediatric / Minor Patient Management ?'[]'$  - 0 ?Isolation Patient Management ?'[]'$  - 0 ?Hearing / Language / Visual special needs ?'[]'$  - 0 ?Assessment of Community assistance (transportation, D/C planning, etc.) ?'[]'$  - 0 ?Additional  assistance / Altered mentation ?'[]'$  - 0 ?Support Surface(s) Assessment (bed, cushion, seat, etc.) ?INTERVENTIONS - Wound Cleansing / Measurement ?'[]'$  - 0 ?Simple Wound Cleansing - one wound ?  X- 4 5 ?Complex Wound Cleansing - multiple wounds ?'[]'$  - 0 ?Wound Imaging (photographs - any number of wounds) ?'[]'$  - 0 ?Wound Tracing (instead of photographs) ?'[]'$  - 0 ?Simple Wound Measurement - one wound ?X- 4 5 ?Complex Wound Measurement - multiple wounds ?INTERVENTIONS - Wound Dressings ?'[]'$  - 0 ?Small Wound Dressing one or multiple wounds ?'[]'$  - 0 ?Medium Wound Dressing one or multiple wounds ?X- 2 20 ?Large Wound Dressing one or multiple wounds ?X- 1 5 ?Application of Medications - topical ?'[]'$  - 0 ?Application of Medications - injection ?INTERVENTIONS - Miscellaneous ?'[]'$  - 0 ?External ear exam ?'[]'$  - 0 ?Specimen Collection (cultures, biopsies, blood, body fluids, etc.) ?'[]'$  - 0 ?Specimen(s) / Culture(s) sent or taken to Lab for analysis ?'[]'$  - 0 ?Patient Transfer (multiple staff / Civil Service fast streamer / Similar devices) ?'[]'$  - 0 ?Simple Staple / Suture removal (25 or less) ?'[]'$  - 0 ?Complex Staple / Suture removal (26 or more) ?'[]'$  - 0 ?Hypo / Hyperglycemic Management (close monitor of Blood Glucose) ?'[]'$  - 0 ?Ankle / Brachial Index ([name]) - do not check if billed separately ?X- 1 5 ?Vital Signs ?Has the patient been seen at the hospital within the last three years: Yes ?Total Score: 160 ?Level Of Care: New/Established - Level 5 ?Electronic Signature(s) ?Signed: 08/03/2021 1:42:34 PM By: Levan Hurst RN, BSN ?Entered By: Levan Hurst on 08/03/2021 12:35:04 ?-------------------------------------------------------------------------------- ?Encounter Discharge Information Details ?Patient Name: Date of Service: ?[name], [place] [name] 08/03/2021 11:15 A M ?Medical Record Number: [id number] ?Patient Account Number: [id number] ?Date of Birth/Sex: Treating RN: ?1970-08-16 (51 y.o. [name], [name] ?Primary Care Radley Barto: [name] Other  Clinician: ?Referring Tacarra Justo: ?Treating Elmus Mathes/Extender: Fredirick Maudlin ?[name] ?Weeks in Treatment: 15 ?Encounter Discharge Information Items ?Discharge Condition: Stable ?Ambulatory Status: Wheelchair ?Discharge Destination: Home ?Transportation: Private Auto ?Accompanied By: alone ?Schedule Follow-up Appointment: Yes ?Clinical Summary of Care: Patient Declined ?Electronic Signature(s) ?Signed: 08/03/2021 1:42:34 PM By: Levan Hurst RN, BSN ?Entered By: Levan Hurst on 08/03/2021 12:35:47 ?-------------------------------------------------------------------------------- ?Wound Assessment Details ?Patient Name: Date of Service: ?[name], [place] [name] 08/03/2021 11:15 A M ?Medical Record Number: [id number] ?Patient Account Number: [id number] ?Date of Birth/Sex: Treating RN: ?[birth year] (51 y.o. [name], [name] ?Primary Care Wren Gallaga: [name] Other Clinician: ?Referring Saraphina Lauderbaugh: ?Treating Josedaniel Haye/Extender: Fredirick Maudlin ?[name] ?Weeks in Treatment: 15 ?Wound Status ?Wound Number: 17 ?Primary Etiology: Sickle Cell Lesion ?Wound Location: Right, Lateral Lower Leg ?Wound Status: Open ?Wounding Event: Gradually Appeared ?Comorbid History: Anemia, Sickle Cell Disease, Neuropathy ?Date Acquired: 10/05/2012 ?Weeks Of Treatment: 15 ?Clustered Wound: Yes ?Wound Measurements ?Length: (cm) 9.5 ?Width: (cm) 5.5 ?Depth: (cm) 0.1 ?Area: (cm?) 41.037 ?Volume: (cm?) 4.104 ?Wound Description ?Classification: Full Thickness Without Exposed Support Structu ?Wound Margin: Flat and Intact ?Exudate Amount: Large ?Exudate Type: Purulent ?Exudate Color: yellow, brown, green ?Foul Odor After Cleansing: ?Slough/Fibrino ?% Reduction in Area: 78.2% ?% Reduction in Volume: 78.2% ?Epithelialization: Small (1-33%) ?Tunneling: No ?Undermining: No ?res ?No ?Yes ?Wound Bed ?Granulation Amount: Small (1-33%) Exposed Structure ?Granulation Quality: Pink ?Fascia Exposed: No ?Necrotic Amount: Large (67-100%) ?Fat Layer  (Subcutaneous Tissue) Exposed: Yes ?Necrotic Quality: Adherent Slough ?Tendon Exposed: No ?Muscle Exposed: No ?Joint Exposed: No ?Bone Exposed: No ?Treatment Notes ?Wound #17 (Lower Leg) Wound Laterality: Right, Lateral ?[place]

## 2021-08-09 ENCOUNTER — Encounter (HOSPITAL_BASED_OUTPATIENT_CLINIC_OR_DEPARTMENT_OTHER): Payer: Medicaid Other | Admitting: General Surgery

## 2021-08-09 ENCOUNTER — Other Ambulatory Visit: Payer: Self-pay

## 2021-08-09 DIAGNOSIS — L97818 Non-pressure chronic ulcer of other part of right lower leg with other specified severity: Secondary | ICD-10-CM | POA: Diagnosis not present

## 2021-08-09 NOTE — Progress Notes (Signed)
[name], [name] ([id number]) ?Visit Report for 08/09/2021 ?Arrival Information Details ?Patient Name: Date of Service: ?[name], [name] 08/09/2021 10:15 A M ?Medical Record Number: [id number] ?Patient Account Number: [id number] ?Date of Birth/Sex: Treating RN: ?May 28, 1970 (51 y.o. F) [name], [name] ?Primary Care Demba Nigh: [name] Other Clinician: ?Referring Latiqua Daloia: ?Treating Leasia Swann/Extender: Fredirick Maudlin ?[name] ?Weeks in Treatment: 16 ?Visit Information History Since Last Visit ?Added or deleted any medications: No ?Patient Arrived: Wheel Chair ?Any new allergies or adverse reactions: No ?Arrival Time: 10:00 ?Had a fall or experienced change in No ?Accompanied By: self ?activities of daily living that may affect ?Transfer Assistance: None ?risk of falls: ?Patient Identification Verified: Yes ?Signs or symptoms of abuse/neglect since last visito No ?Secondary Verification Process Completed: Yes ?Hospitalized since last visit: No ?Patient Requires Transmission-Based Precautions: No ?Implantable device outside of the clinic excluding No ?Patient Has Alerts: No ?cellular tissue based products placed in the center ?since last visit: ?Has Dressing in Place as Prescribed: Yes ?Pain Present Now: No ?Electronic Signature(s) ?Signed: 08/09/2021 11:16:06 AM By: Sandre Kitty ?Entered By: Sandre Kitty on 08/09/2021 10:00:58 ?-------------------------------------------------------------------------------- ?Encounter Discharge Information Details ?Patient Name: Date of Service: ?[name], [name] 08/09/2021 10:15 A M ?Medical Record Number: [id number] ?Patient Account Number: [id number] ?Date of Birth/Sex: Treating RN: ?02-06-1971 (51 y.o. F) [name], [name] ?Primary Care Sherlon Nied: [name] Other Clinician: ?Referring Alyzah Pelly: ?Treating Allona Gondek/Extender: Fredirick Maudlin ?[name] ?Weeks in Treatment: 16 ?Encounter Discharge Information Items Post Procedure Vitals ?Discharge Condition:  Stable ?Temperature (F): 98.6 ?Ambulatory Status: Wheelchair ?Pulse (bpm): 89 ?Discharge Destination: Home ?Respiratory Rate (breaths/min): 18 ?Transportation: Other ?Blood Pressure (mmHg): 113/67 ?Accompanied By: self ?Schedule Follow-up Appointment: Yes ?Clinical Summary of Care: Patient Declined ?Electronic Signature(s) ?Signed: 08/09/2021 6:29:07 PM By: Dellie Catholic RN ?Entered By: Dellie Catholic on 08/09/2021 18:28:41 ?-------------------------------------------------------------------------------- ?Lower Extremity Assessment Details ?Patient Name: ?Date of Service: ?[name], [name] 08/09/2021 10:15 A M ?Medical Record Number: [id number] ?Patient Account Number: [id number] ?Date of Birth/Sex: ?Treating RN: ?[birth year] (51 y.o. [name], [name] ?Primary Care Flemon Kelty: [name] ?Other Clinician: ?Referring Anaclara Acklin: ?Treating Markavious Micco/Extender: Fredirick Maudlin ?[name] ?Weeks in Treatment: 16 ?Edema Assessment ?Assessed: [Left: No] [Right: No] ?Edema: [Left: No] [Right: No] ?Calf ?Left: Right: ?Point of Measurement: 33 cm From Medial Instep 27 cm 28.5 cm ?Ankle ?Left: Right: ?Point of Measurement: 10 cm From Medial Instep 16 cm 22 cm ?Vascular Assessment ?Pulses: ?Dorsalis Pedis ?Palpable: [Left:Yes] [Right:Yes] ?Electronic Signature(s) ?Signed: 08/09/2021 5:45:07 PM By: Levan Hurst RN, BSN ?Entered By: Levan Hurst on 08/09/2021 10:30:53 ?-------------------------------------------------------------------------------- ?Multi Wound Chart Details ?Patient Name: ?Date of Service: ?[name], [name] 08/09/2021 10:15 A M ?Medical Record Number: [id number] ?Patient Account Number: [id number] ?Date of Birth/Sex: ?Treating RN: ?1970/12/27 (51 y.o. F) [name], [name] ?Primary Care Starasia Sinko: [name] ?Other Clinician: ?Referring [name] Santillana: ?Treating Hattye Siegfried/Extender: Fredirick Maudlin ?[name] ?Weeks in Treatment: 16 ?Vital Signs ?Height(in): 67 ?Pulse(bpm): 89 ?Weight(lbs): ?Blood  Pressure(mmHg): 113/67 ?Body Mass Index(BMI): ?Temperature(??F): 98.6 ?Respiratory Rate(breaths/min): 18 ?Photos: [17:Right, Lateral Lower Leg] [18:Left, Posterior Ankle] [19:Left, Medial Ankle] ?Wound Location: [17:Gradually Appeared] [18:Gradually Appeared] [19:Gradually Appeared] ?Wounding Event: [17:Sickle Cell Lesion] [18:Sickle Cell Lesion] [19:Sickle Cell Lesion] ?Primary Etiology: [17:N/A] [18:N/A] [19:N/A] ?Secondary Etiology: [17:Anemia, Sickle Cell Disease,] [18:Anemia, Sickle Cell Disease,] [19:Anemia, Sickle Cell Disease,] ?Comorbid History: [17:Neuropathy 10/05/2012] [18:Neuropathy 12/25/2020] [19:Neuropathy 12/25/2020] ?Date Acquired: [17:16] [18:16] [19:16] ?Weeks of Treatment: [17:Open] [18:Healed - Epithelialized] [19:Open] ?Wound Status: [17:No] [18:No] [19:No] ?Wound Recurrence: [17:Yes] [18:No] [19:No] ?Clustered Wound: [17:8.8x5.2x0.1] [18:0x0x0] [19:1.7x0.9x0.1] ?Measurements L x W  x D (cm) [17:35.94] [18:0] [19:1.202] ?A (cm?) : ?rea [17:3.594] [18:0] [19:0.12] ?Volume (cm?) : [17:80.90%] [18:100.00%] [19:94.30%] ?% Reduction in A [17:rea: 80.90%] [18:100.00%] [19:99.20%] ?% Reduction in Volume: [17:Full Thickness Without Exposed] [18:Full Thickness Without Exposed] [19:Full Thickness Without Exposed] ?Classification: [17:Support Structures Large] [18:Support Structures None Present] [19:Support Structures Medium] ?Exudate A mount: [17:Purulent] [18:N/A] [19:Serosanguineous] ?Exudate Type: [17:yellow, brown, green] [18:N/A] [19:red, brown] ?Exudate Color: [17:Flat and Intact] [18:Flat and Intact] [19:Flat and Intact] ?Wound Margin: [17:Small (1-33%)] [18:None Present (0%)] [19:Medium (34-66%)] ?Granulation A mount: [17:Pink] [18:N/A] [19:Red, Pink, Hyper-granulation] ?Granulation Quality: [17:Large (67-100%)] [18:None Present (0%)] [19:Medium (34-66%)] ?Necrotic A mount: ?[17:Fat Layer (Subcutaneous Tissue): Yes Fascia: No] [19:Fat Layer (Subcutaneous Tissue): Yes] ?Exposed  Structures: ?[17:Fascia: No Tendon: No Muscle: No Joint: No Bone: No Small (1-33%)] [18:Fat Layer (Subcutaneous Tissue): No Tendon: No Muscle: No Joint: No Bone: No Large (67-100%)] [19:Fascia: No Tendon: No Muscle: No Joint: No Bone: No Small (1-33%)] ?Epithelialization: [17:Debridement - Selective/Open Wound N/A] [19:Debridement - Selective/Open Wound] ?Debridement: ?Pre-procedure Verification/Time Out 10:44 [18:N/A] [19:10:44] ?Taken: [17:Other] [18:N/A] [19:Other] ?Pain Control: [17:Slough] [18:N/A] [19:Slough] ?Tissue Debrided: [17:Non-Viable Tissue] [18:N/A] [19:Non-Viable Tissue] ?Level: [17:45.76] [18:N/A] [19:1.53] ?Debridement A (sq cm): [17:rea Curette] [18:N/A] [19:Curette] ?Instrument: [17:Minimum] [18:N/A] [19:Minimum] ?Bleeding: [17:Pressure] [18:N/A] [19:Pressure] ?Hemostasis A chieved: [17:0] [18:N/A] [19:0] ?Procedural Pain: [17:0] [18:N/A] [19:0] ?Post Procedural Pain: [17:Procedure was tolerated well] [18:N/A] [19:Procedure was tolerated well] ?Debridement Treatment Response: [17:8.8x5.2x0.1] [18:N/A] [19:1.7x0.9x0.1] ?Post Debridement Measurements L x ?W x D (cm) [17:3.594] [18:N/A] [19:0.12] ?Post Debridement Volume: (cm?) [17:Debridement] [18:N/A] [19:Debridement] ?Wound Number: 21 N/A N/A ?Photos: N/A N/A ?Right, Medial Ankle N/A N/A ?Wound Location: ?Gradually Appeared N/A N/A ?Wounding Event: ?Sickle Cell Lesion N/A N/A ?Primary Etiology: ?Venous Leg Ulcer N/A N/A ?Secondary Etiology: ?Anemia, Sickle Cell Disease, N/A N/A ?Comorbid History: ?Neuropathy ?06/26/2021 N/A N/A ?Date Acquired: ?6 N/A N/A ?Weeks of Treatment: ?Open N/A N/A ?Wound Status: ?No N/A N/A ?Wound Recurrence: ?No N/A N/A ?Clustered Wound: ?6x3.4x0.1 N/A N/A ?Measurements L x W x D (cm) ?16.022 N/A N/A ?A (cm?) : ?rea ?1.602 N/A N/A ?Volume (cm?) : ?44.70% N/A N/A ?% Reduction in Area: ?44.70% N/A N/A ?% Reduction in Volume: ?Full Thickness Without Exposed N/A N/A ?Classification: ?Support Structures ?Large N/A N/A ?Exudate  Amount: ?Purulent N/A N/A ?Exudate Type: ?yellow, brown, green N/A N/A ?Exudate Color: ?Distinct, outline attached N/A N/A ?Wound Margin: ?Small (1-33%) N/A N/A ?Granulation Amount: ?Red, Pink N/A N/A ?Granulation Quality: ?Large

## 2021-08-09 NOTE — Progress Notes (Addendum)
[name], [name] ([id number]) ?Visit Report for 08/09/2021 ?Chief Complaint Document Details ?Patient Name: Date of Service: ?[name], [name] 08/09/2021 10:15 A M ?Medical Record Number: [id number] ?Patient Account Number: [id number] ?Date of Birth/Sex: Treating RN: ?11-Jun-1970 (51 y.o. F) [name], [name] ?Primary Care Provider: [name] Other Clinician: ?Referring Provider: ?Treating Provider/Extender: Fredirick Maudlin ?[name] ?Weeks in Treatment: 16 ?Information Obtained from: Patient ?Chief Complaint ?the patient is here for evaluation of her bilateral lower extremity sickle cell ulcers ?04/17/2021; patient comes in for substantial wounds on the right and left lower leg ?Electronic Signature(s) ?Signed: 08/09/2021 11:27:59 AM By: Fredirick Maudlin MD FACS ?Entered By: Fredirick Maudlin on 08/09/2021 11:27:59 ?-------------------------------------------------------------------------------- ?Debridement Details ?Patient Name: Date of Service: ?[name], [name] 08/09/2021 10:15 A M ?Medical Record Number: [id number] ?Patient Account Number: [id number] ?Date of Birth/Sex: Treating RN: ?[birth year] (51 y.o. F) [name], [name] ?Primary Care Provider: [name] Other Clinician: ?Referring Provider: ?Treating Provider/Extender: Fredirick Maudlin ?[name] ?Weeks in Treatment: 16 ?Debridement Performed for Assessment: Wound #17 Right,Lateral Lower Leg ?Performed By: Physician Fredirick Maudlin, MD ?Debridement Type: Debridement ?Level of Consciousness (Pre-procedure): Awake and Alert ?Pre-procedure Verification/Time Out Yes - 10:44 ?Taken: ?Start Time: 10:44 ?Pain Control: ?Other : Benzocaine ?T Area Debrided (L x W): ?otal 8.8 (cm) x 5.2 (cm) = 45.76 (cm?) ?Tissue and other material debrided: Non-Viable, Slough, Biofilm, Fibrin/Exudate, [place] ?Level: Non-Viable Tissue ?Debridement Description: Selective/Open Wound ?Instrument: Curette ?Specimen: Tissue Culture ?Number of Specimens T aken: 1 ?Bleeding:  Minimum ?Hemostasis Achieved: Pressure ?End Time: 10:47 ?Procedural Pain: 0 ?Post Procedural Pain: 0 ?Response to Treatment: Procedure was tolerated well ?Level of Consciousness (Post- Awake and Alert ?procedure): ?Post Debridement Measurements of Total Wound ?Length: (cm) 8.8 ?Width: (cm) 5.2 ?Depth: (cm) 0.1 ?Volume: (cm?) 3.594 ?Character of Wound/Ulcer Post Debridement: Improved ?Post Procedure Diagnosis ?Same as Pre-procedure ?Electronic Signature(s) ?Signed: 08/09/2021 12:32:07 PM By: Fredirick Maudlin MD FACS ?Signed: 08/09/2021 6:29:07 PM By: Dellie Catholic RN ?Entered By: Dellie Catholic on 08/09/2021 10:50:53 ?-------------------------------------------------------------------------------- ?Debridement Details ?Patient Name: ?Date of Service: ?[name], [name] 08/09/2021 10:15 A M ?Medical Record Number: [id number] ?Patient Account Number: [id number] ?Date of Birth/Sex: ?Treating RN: ?May 11, 1971 (51 y.o. F) [name], [name] ?Primary Care Provider: [name] ?Other Clinician: ?Referring Provider: ?Treating Provider/Extender: Fredirick Maudlin ?[name] ?Weeks in Treatment: 16 ?Debridement Performed for Assessment: Wound #19 Left,Medial Ankle ?Performed By: Physician Fredirick Maudlin, MD ?Debridement Type: Debridement ?Level of Consciousness (Pre-procedure): Awake and Alert ?Pre-procedure Verification/Time Out Yes - 10:44 ?Taken: ?Start Time: 10:44 ?Pain Control: ?Other : Benzocaine ?T Area Debrided (L x W): ?otal 1.7 (cm) x 0.9 (cm) = 1.53 (cm?) ?Tissue and other material debrided: Non-Viable, Slough, Biofilm, Fibrin/Exudate, [place] ?Level: Non-Viable Tissue ?Debridement Description: Selective/Open Wound ?Instrument: Curette ?Bleeding: Minimum ?Hemostasis Achieved: Pressure ?End Time: 10:47 ?Procedural Pain: 0 ?Post Procedural Pain: 0 ?Response to Treatment: Procedure was tolerated well ?Level of Consciousness (Post- Awake and Alert ?procedure): ?Post Debridement Measurements of Total Wound ?Length:  (cm) 1.7 ?Width: (cm) 0.9 ?Depth: (cm) 0.1 ?Volume: (cm?) 0.12 ?Character of Wound/Ulcer Post Debridement: Improved ?Post Procedure Diagnosis ?Same as Pre-procedure ?Electronic Signature(s) ?Signed: 08/09/2021 12:32:07 PM By: Fredirick Maudlin MD FACS ?Signed: 08/09/2021 6:29:07 PM By: Dellie Catholic RN ?Entered By: Dellie Catholic on 08/09/2021 10:52:27 ?-------------------------------------------------------------------------------- ?Debridement Details ?Patient Name: ?Date of Service: ?[name],  [name] 08/09/2021 10:15 A M ?Medical Record Number: [id number] ?Patient Account Number: [id number] ?Date of Birth/Sex: ?Treating RN: ?1971/02/11 (51 y.o. F) [name], [name] ?Primary Care Provider: [name] ?Other Clinician: ?Referring Provider: ?  Treating Provider/Extender: Fredirick Maudlin ?[name] ?Weeks in Treatment: 16 ?Debridement Performed for Assessment: Wound #21 Right,Medial Ankle ?Performed By: Physician Fredirick Maudlin, MD ?Debridement Type: Debridement ?Severity of Tissue Pre Debridement: Fat layer exposed ?Level of Consciousness (Pre-procedure): Awake and Alert ?Pre-procedure Verification/Time Out Yes - 10:44 ?Taken: ?Start Time: 10:44 ?Pain Control: ?Other : Benzocaine ?T Area Debrided (L x W): ?otal 6 (cm) x 3.4 (cm) = 20.4 (cm?) ?Tissue and other material debrided: Non-Viable, Slough, Biofilm, Fibrin/Exudate, [place] ?Level: Non-Viable Tissue ?Debridement Description: Selective/Open Wound ?Instrument: Curette ?Bleeding: Minimum ?Hemostasis Achieved: Pressure ?End Time: 10:47 ?Procedural Pain: 0 ?Post Procedural Pain: 0 ?Response to Treatment: Procedure was tolerated well ?Level of Consciousness (Post- Awake and Alert ?procedure): ?Post Debridement Measurements of Total Wound ?Length: (cm) 6 ?Width: (cm) 3.4 ?Depth: (cm) 0.1 ?Volume: (cm?) 1.602 ?Character of Wound/Ulcer Post Debridement: Improved ?Severity of Tissue Post Debridement: Fat layer exposed ?Post Procedure Diagnosis ?Same as  Pre-procedure ?Electronic Signature(s) ?Signed: 08/09/2021 12:32:07 PM By: Fredirick Maudlin MD FACS ?Signed: 08/09/2021 6:29:07 PM By: Dellie Catholic RN ?Entered By: Dellie Catholic on 08/09/2021 10:53:28 ?-------------------------------------------------------------------------------- ?HPI Details ?Patient Name: ?Date of Service: ?[name], [place] [name] 08/09/2021 10:15 A M ?Medical Record Number: [id number] ?Patient Account Number: [id number] ?Date of Birth/Sex: ?Treating RN: ?Feb 15, 1971 (51 y.o. F) [name], [name] ?Primary Care Provider: [name] ?Other Clinician: ?Referring Provider: ?Treating Provider/Extender: Fredirick Maudlin ?[name] ?Weeks in Treatment: 16 ?History of Present Illness ?Location: medial and lateral ankle region on the right and left medial malleolus ?Quality: Patient reports experiencing a shooting pain to affected area(s). ?Severity: Patient states wound(s) are getting worse. ?Duration: right lower extremity bimalleolar ulcers have been present for approximately 2 years; the rright meedial malleolus ulcer has been there proximally 6 ?months ?Timing: Pain in wound is constant (hurts all the time) ?Context: The wound would happen gradually ?ssociated Signs and Symptoms: Patient reports having increase discharge. ?A ?HPI Description: 51 year old patient with a history of sickle cell anemia who was last seen by me with ulceration of the right lower extremity above the ankle ?and was referred to Dr. Leland Johns for a surgical debridement as I was unable to do anything in the office due to excruciating pain. At that stage she was ?referred from the plastic surgery service to dermatology who treated her for a skin infection with doxycycline and then Levaquin and a local antibiotic ?ointment. I understand the patient has since developed ulceration on the left ankle both medial and lateral and was now referred back to the wound center as ?dermatology has finished the management. I do not have any  notes from the dermatology department ?Old notes: ?51 year old patient with a history of sickle cell anemia, pain bilateral lower extremities, right lower extremity ulcer and has a history of receiving a skin gr

## 2021-08-15 LAB — AEROBIC/ANAEROBIC CULTURE W GRAM STAIN (SURGICAL/DEEP WOUND): Gram Stain: NONE SEEN

## 2021-08-16 ENCOUNTER — Ambulatory Visit (INDEPENDENT_AMBULATORY_CARE_PROVIDER_SITE_OTHER): Payer: Medicaid Other | Admitting: Family Medicine

## 2021-08-16 ENCOUNTER — Encounter: Payer: Self-pay | Admitting: Family Medicine

## 2021-08-16 ENCOUNTER — Encounter (HOSPITAL_BASED_OUTPATIENT_CLINIC_OR_DEPARTMENT_OTHER): Payer: Medicaid Other | Admitting: General Surgery

## 2021-08-16 VITALS — BP 130/80 | HR 63 | Temp 97.5°F | Ht 67.0 in | Wt 132.0 lb

## 2021-08-16 DIAGNOSIS — E43 Unspecified severe protein-calorie malnutrition: Secondary | ICD-10-CM | POA: Diagnosis not present

## 2021-08-16 DIAGNOSIS — Z1231 Encounter for screening mammogram for malignant neoplasm of breast: Secondary | ICD-10-CM

## 2021-08-16 DIAGNOSIS — L97912 Non-pressure chronic ulcer of unspecified part of right lower leg with fat layer exposed: Secondary | ICD-10-CM

## 2021-08-16 DIAGNOSIS — D571 Sickle-cell disease without crisis: Secondary | ICD-10-CM

## 2021-08-16 DIAGNOSIS — L97922 Non-pressure chronic ulcer of unspecified part of left lower leg with fat layer exposed: Secondary | ICD-10-CM

## 2021-08-16 DIAGNOSIS — E559 Vitamin D deficiency, unspecified: Secondary | ICD-10-CM

## 2021-08-16 DIAGNOSIS — L97818 Non-pressure chronic ulcer of other part of right lower leg with other specified severity: Secondary | ICD-10-CM | POA: Diagnosis not present

## 2021-08-16 DIAGNOSIS — G894 Chronic pain syndrome: Secondary | ICD-10-CM

## 2021-08-16 DIAGNOSIS — D638 Anemia in other chronic diseases classified elsewhere: Secondary | ICD-10-CM

## 2021-08-16 MED ORDER — OXYCODONE HCL 10 MG PO TABS: 10.0000 mg | ORAL_TABLET | ORAL | 0 refills | Status: DC

## 2021-08-16 MED ORDER — ENSURE ENLIVE PO LIQD: 237.0000 mL | Freq: Two times a day (BID) | ORAL | 12 refills | Status: AC

## 2021-08-16 MED ORDER — IBUPROFEN 600 MG PO TABS: 600.0000 mg | ORAL_TABLET | Freq: Four times a day (QID) | ORAL | 3 refills | Status: DC | PRN

## 2021-08-16 NOTE — Progress Notes (Signed)
LENETTE, RAU ([id number]) ?Visit Report for 08/16/2021 ?Arrival Information Details ?Patient Name: Date of Service: ?[name], [place] [name] 08/16/2021 11:00 A M ?Medical Record Number: [id number] ?Patient Account Number: [id number] ?Date of Birth/Sex: Treating RN: ?1970-11-08 (51 y.o. F) [name], [name] ?Primary Care Tanaisha Pittman: [name] Other Clinician: ?Referring Pearlina Friedly: ?Treating Amillia Biffle/Extender: Fredirick Maudlin ?[name] ?Weeks in Treatment: 17 ?Visit Information History Since Last Visit ?Added or deleted any medications: No ?Patient Arrived: Wheel Chair ?Any new allergies or adverse reactions: No ?Arrival Time: 11:20 ?Had a fall or experienced change in No ?Accompanied By: self ?activities of daily living that may affect ?Transfer Assistance: None ?risk of falls: ?Patient Identification Verified: Yes ?Signs or symptoms of abuse/neglect since last visito No ?Patient Requires Transmission-Based Precautions: No ?Hospitalized since last visit: No ?Patient Has Alerts: No ?Implantable device outside of the clinic excluding No ?cellular tissue based products placed in the center ?since last visit: ?Has Dressing in Place as Prescribed: Yes ?Has Compression in Place as Prescribed: Yes ?Pain Present Now: Yes ?Electronic Signature(s) ?Signed: 08/16/2021 5:36:09 PM By: Dellie Catholic RN ?Entered By: Dellie Catholic on 08/16/2021 11:21:49 ?-------------------------------------------------------------------------------- ?Encounter Discharge Information Details ?Patient Name: Date of Service: ?[name], [place] [name] 08/16/2021 11:00 A M ?Medical Record Number: [id number] ?Patient Account Number: [id number] ?Date of Birth/Sex: Treating RN: ?[birth year] (51 y.o. F) [name], [name] ?Primary Care Bentley Fissel: [name] Other Clinician: ?Referring Felishia Wartman: ?Treating Teyana Pierron/Extender: Fredirick Maudlin ?[name] ?Weeks in Treatment: 17 ?Encounter Discharge Information Items Post Procedure Vitals ?Discharge Condition:  Stable ?Temperature (F): 98.4 ?Ambulatory Status: Wheelchair ?Pulse (bpm): 90 ?Discharge Destination: Home ?Respiratory Rate (breaths/min): 18 ?Transportation: Other ?Blood Pressure (mmHg): 133/85 ?Accompanied By: self ?Schedule Follow-up Appointment: Yes ?Clinical Summary of Care: Patient Declined ?Electronic Signature(s) ?Signed: 08/16/2021 5:36:09 PM By: Dellie Catholic RN ?Entered By: Dellie Catholic on 08/16/2021 17:35:47 ?-------------------------------------------------------------------------------- ?Lower Extremity Assessment Details ?Patient Name: ?Date of Service: ?[name], [place] [name] 08/16/2021 11:00 A M ?Medical Record Number: [id number] ?Patient Account Number: [id number] ?Date of Birth/Sex: ?Treating RN: ?1970/11/19 (51 y.o. F) [name], [name] ?Primary Care Banesa Tristan: [name] ?Other Clinician: ?Referring Stephania Macfarlane: ?Treating Ovie Cornelio/Extender: Fredirick Maudlin ?[name] ?Weeks in Treatment: 17 ?Edema Assessment ?Assessed: [Left: No] [Right: No] ?Edema: [Left: No] [Right: No] ?Calf ?Left: Right: ?Point of Measurement: 33 cm From Medial Instep 27 cm 27.6 cm ?Ankle ?Left: Right: ?Point of Measurement: 10 cm From Medial Instep 16 cm 20.5 cm ?Electronic Signature(s) ?Signed: 08/16/2021 5:36:09 PM By: Dellie Catholic RN ?Entered By: Dellie Catholic on 08/16/2021 11:26:57 ?-------------------------------------------------------------------------------- ?Multi Wound Chart Details ?Patient Name: ?Date of Service: ?[name], [place] [name] 08/16/2021 11:00 A M ?Medical Record Number: [id number] ?Patient Account Number: [id number] ?Date of Birth/Sex: ?Treating RN: ?[birth year] (51 y.o. F) [name], [name] ?Primary Care Luka Reisch: [name] ?Other Clinician: ?Referring Abdikadir Fohl: ?Treating Aleesia Henney/Extender: Fredirick Maudlin ?[name] ?Weeks in Treatment: 17 ?Vital Signs ?Height(in): 67 ?Pulse(bpm): 90 ?Weight(lbs): ?Blood Pressure(mmHg): 133/85 ?Body Mass Index(BMI): ?Temperature(??F): 98.4 ?Respiratory  Rate(breaths/min): 18 ?Photos: ?Right, Lateral Lower Leg Left, Medial Ankle Right, Medial Ankle ?Wound Location: ?Gradually Appeared Gradually Appeared Gradually Appeared ?Wounding Event: ?Sickle Cell Lesion Sickle Cell Lesion Sickle Cell Lesion ?Primary Etiology: ?N/A N/A Venous Leg Ulcer ?Secondary Etiology: ?Anemia, Sickle Cell Disease, Anemia, Sickle Cell Disease, Anemia, Sickle Cell Disease, ?Comorbid History: ?Neuropathy Neuropathy Neuropathy ?10/05/2012 12/25/2020 06/26/2021 ?Date Acquired: ?_0 ?Weeks of Treatment: ?Open Open Open ?Wound Status: ?No No No ?Wound Recurrence: ?Yes No No ?Clustered Wound: ?8x3.9x0.1 1.6x0.5x0.1 5.6x3x0.1 ?Measurements L x W x D (cm) ?24.504 0.628 13.195 ?  A (cm?) : ?rea ?2.45 0.063 1.319 ?Volume (cm?) : ?87.00% 97.00% 54.50% ?% Reduction in Area: ?87.00% 99.60% 54.50% ?% Reduction in Volume: ?Full Thickness Without Exposed Full Thickness Without Exposed Full Thickness Without Exposed ?Classification: ?Support Structures Support Structures Support Structures ?Large Medium Large ?Exudate Amount: ?Purulent Serosanguineous Purulent ?Exudate Type: ?yellow, brown, green red, brown yellow, brown, green ?Exudate Color: ?Flat and Intact Flat and Intact Distinct, outline attached ?Wound Margin: ?Small (1-33%) Medium (34-66%) Small (1-33%) ?Granulation Amount: ?Pink Red, Pink, Hyper-granulation Red ?Granulation Quality: ?Large (67-100%) Medium (34-66%) Large (67-100%) ?Necrotic Amount: ?Fat Layer (Subcutaneous Tissue): Yes Fat Layer (Subcutaneous Tissue): Yes Fat Layer (Subcutaneous Tissue): Yes ?Exposed Structures: ?Fascia: No ?Fascia: No ?Fascia: No ?Tendon: No ?Tendon: No ?Tendon: No ?Muscle: No ?Muscle: No ?Muscle: No ?Joint: No ?Joint: No ?Joint: No ?Bone: No ?Bone: No ?Bone: No ?Small (1-33%) Small (1-33%) Small (1-33%) ?Epithelialization: ?Treatment Notes ?Electronic Signature(s) ?Signed: 08/16/2021 11:39:25 AM By: Fredirick Maudlin MD FACS ?Signed: 08/16/2021 5:36:09 PM By: Dellie Catholic RN ?Entered By: Fredirick Maudlin on 08/16/2021 11:39:24 ?-------------------------------------------------------------------------------- ?Multi-Disciplinary Care Plan Details ?Patient Name: ?Date of Service: ?[name], [place] [name] 08/16/2021 11:00 A M ?Medical Record Number: [id number] ?Patient Account Number: [id number] ?Date of Birth/Sex: ?Treating RN: ?[birth year] (51 y.o. F) [name], [name] ?Primary Care Marcele Kosta: [name] ?Other Clinician: ?Referring Shadaya Marschner: ?Treating Stepehn Eckard/Extender: Fredirick Maudlin ?[name] ?Weeks in Treatment: 17 ?Multidisciplinary Care Plan reviewed with physician ?Active Inactive ?Venous Leg Ulcer ?Nursing Diagnoses: ?Actual venous Insuffiency (use after diagnosis is confirmed) ?Knowledge deficit related to disease process and management ?Goals: ?Patient will maintain optimal edema control ?Date Initiated: 06/26/2021 ?Target Resolution Date: 09/07/2021 ?Goal Status: Active ?Interventions: ?Assess peripheral edema status every visit. ?Compression as ordered ?Treatment Activities: ?Therapeutic compression applied : 06/26/2021 ?Notes: ?Wound/Skin Impairment ?Nursing Diagnoses: ?Impaired tissue integrity ?Goals: ?Patient/caregiver will verbalize understanding of skin care regimen ?Date Initiated: 04/17/2021 ?Target Resolution Date: 09/07/2021 ?Goal Status: Active ?Ulcer/skin breakdown will have a volume reduction of 30% by week 4 ?Date Initiated: 04/17/2021 ?Date Inactivated: 05/29/2021 ?Target Resolution Date: 05/15/2021 ?Goal Status: Met ?Ulcer/skin breakdown will have a volume reduction of 50% by week 8 ?Date Initiated: 05/29/2021 ?Date Inactivated: 06/26/2021 ?Target Resolution Date: 06/26/2021 ?Goal Status: Unmet ?Unmet Reason: venous reflux ?Ulcer/skin breakdown will have a volume reduction of 80% by week 12 ?Date Initiated: 06/26/2021 ?Target Resolution Date: 09/07/2021 ?Goal Status: Active ?Interventions: ?Assess patient/caregiver ability to obtain necessary supplies ?Assess  patient/caregiver ability to perform ulcer/skin care regimen upon admission and as needed ?Assess ulceration(s) every visit ?Provide education on ulcer and skin care ?Treatment Activities: ?Topical wound manageme

## 2021-08-16 NOTE — Progress Notes (Addendum)
[name], KRASZEWSKI ([id number]) ?Visit Report for 08/16/2021 ?Chief Complaint Document Details ?Patient Name: Date of Service: ?[name], [place] [name] 08/16/2021 11:00 A M ?Medical Record Number: [id number] ?Patient Account Number: [id number] ?Date of Birth/Sex: Treating RN: ?09/18/1970 (51 y.o. F) [name], [name] ?Primary Care Provider: [name] Other Clinician: ?Referring Provider: ?Treating Provider/Extender: Fredirick Maudlin ?[name] ?Weeks in Treatment: 17 ?Information Obtained from: Patient ?Chief Complaint ?the patient is here for evaluation of her bilateral lower extremity sickle cell ulcers ?04/17/2021; patient comes in for substantial wounds on the right and left lower leg ?Electronic Signature(s) ?Signed: 08/16/2021 11:48:09 AM By: Fredirick Maudlin MD FACS ?Entered By: Fredirick Maudlin on 08/16/2021 11:48:09 ?-------------------------------------------------------------------------------- ?Debridement Details ?Patient Name: Date of Service: ?[name], [place] [name] 08/16/2021 11:00 A M ?Medical Record Number: [id number] ?Patient Account Number: [id number] ?Date of Birth/Sex: Treating RN: ?1970-06-06 (51 y.o. F) [name], [name] ?Primary Care Provider: [name] Other Clinician: ?Referring Provider: ?Treating Provider/Extender: Fredirick Maudlin ?[name] ?Weeks in Treatment: 17 ?Debridement Performed for Assessment: Wound #21 Right,Medial Ankle ?Performed By: Physician Fredirick Maudlin, MD ?Debridement Type: Debridement ?Severity of Tissue Pre Debridement: Fat layer exposed ?Level of Consciousness (Pre-procedure): Awake and Alert ?Pre-procedure Verification/Time Out Yes - 11:41 ?Taken: ?Start Time: 11:41 ?Pain Control: Lidocaine 5% topical ointment ?T Area Debrided (L x W): ?otal 5.6 (cm) x 3 (cm) = 16.8 (cm?) ?Tissue and other material debrided: Non-Viable, Slough, Subcutaneous, [place] ?Level: Skin/Subcutaneous Tissue ?Debridement Description: Excisional ?Instrument: Curette ?Bleeding:  Minimum ?Hemostasis Achieved: Pressure ?End Time: 11:44 ?Procedural Pain: 0 ?Post Procedural Pain: 0 ?Response to Treatment: Procedure was tolerated well ?Level of Consciousness (Post- Awake and Alert ?procedure): ?Post Debridement Measurements of Total Wound ?Length: (cm) 5.6 ?Width: (cm) 3 ?Depth: (cm) 0.1 ?Volume: (cm?) 1.319 ?Character of Wound/Ulcer Post Debridement: Improved ?Severity of Tissue Post Debridement: Fat layer exposed ?Post Procedure Diagnosis ?Same as Pre-procedure ?Electronic Signature(s) ?Signed: 08/16/2021 5:31:11 PM By: Fredirick Maudlin MD FACS ?Signed: 08/16/2021 5:36:09 PM By: Dellie Catholic RN ?Entered By: Dellie Catholic on 08/16/2021 11:55:46 ?-------------------------------------------------------------------------------- ?Debridement Details ?Patient Name: ?Date of Service: ?[name], [place] [name] 08/16/2021 11:00 A M ?Medical Record Number: [id number] ?Patient Account Number: [id number] ?Date of Birth/Sex: ?Treating RN: ?1970/06/21 (51 y.o. F) [name], [name] ?Primary Care Provider: [name] ?Other Clinician: ?Referring Provider: ?Treating Provider/Extender: Fredirick Maudlin ?[name] ?Weeks in Treatment: 17 ?Debridement Performed for Assessment: Wound #17 Right,Lateral Lower Leg ?Performed By: Physician Fredirick Maudlin, MD ?Debridement Type: Debridement ?Level of Consciousness (Pre-procedure): Awake and Alert ?Pre-procedure Verification/Time Out Yes - 11:41 ?Taken: ?Start Time: 11:41 ?Pain Control: Lidocaine 5% topical ointment ?T Area Debrided (L x W): ?otal 8 (cm) x 3.9 (cm) = 31.2 (cm?) ?Tissue and other material debrided: Non-Viable, Slough, Subcutaneous, [place] ?Level: Skin/Subcutaneous Tissue ?Debridement Description: Excisional ?Instrument: Curette ?Bleeding: Minimum ?Hemostasis Achieved: Pressure ?End Time: 11:44 ?Procedural Pain: 0 ?Post Procedural Pain: 0 ?Response to Treatment: Procedure was tolerated well ?Level of Consciousness (Post- Awake and  Alert ?procedure): ?Post Debridement Measurements of Total Wound ?Length: (cm) 8 ?Width: (cm) 3.9 ?Depth: (cm) 0.1 ?Volume: (cm?) 2.45 ?Character of Wound/Ulcer Post Debridement: Improved ?Post Procedure Diagnosis ?Same as Pre-procedure ?Electronic Signature(s) ?Signed: 08/16/2021 5:31:11 PM By: Fredirick Maudlin MD FACS ?Signed: 08/16/2021 5:36:09 PM By: Dellie Catholic RN ?Entered By: Dellie Catholic on 08/16/2021 11:56:38 ?-------------------------------------------------------------------------------- ?Debridement Details ?Patient Name: ?Date of Service: ?[name], [place] [name] 08/16/2021 11:00 A M ?Medical Record Number: [id number] ?Patient Account Number: [id number] ?Date of Birth/Sex: ?Treating RN: ?Mar 19, 1971 (51 y.o. F) [name], [name] ?Primary Care Provider: Smith Robert,  [place] ?Other Clinician: ?Referring Provider: ?Treating Provider/Extender: Fredirick Maudlin ?[name] ?Weeks in Treatment: 17 ?Debridement Performed for Assessment: Wound #19 Left,Medial Ankle ?Performed By: Physician Fredirick Maudlin, MD ?Debridement Type: Debridement ?Level of Consciousness (Pre-procedure): Awake and Alert ?Pre-procedure Verification/Time Out Yes - 11:41 ?Taken: ?Start Time: 11:41 ?Pain Control: Lidocaine 5% topical ointment ?T Area Debrided (L x W): ?otal 1.6 (cm) x 0.5 (cm) = 0.8 (cm?) ?Tissue and other material debrided: Non-Viable, Slough, Subcutaneous, [place] ?Level: Skin/Subcutaneous Tissue ?Debridement Description: Excisional ?Instrument: Curette ?Bleeding: Minimum ?Hemostasis Achieved: Pressure ?End Time: 11:44 ?Procedural Pain: 0 ?Post Procedural Pain: 0 ?Response to Treatment: Procedure was tolerated well ?Level of Consciousness (Post- Awake and Alert ?procedure): ?Post Debridement Measurements of Total Wound ?Length: (cm) 1.6 ?Width: (cm) 0.5 ?Depth: (cm) 0.1 ?Volume: (cm?) 0.063 ?Character of Wound/Ulcer Post Debridement: Improved ?Post Procedure Diagnosis ?Same as Pre-procedure ?Electronic Signature(s) ?Signed:  08/16/2021 5:31:11 PM By: Fredirick Maudlin MD FACS ?Signed: 08/16/2021 5:36:09 PM By: Dellie Catholic RN ?Entered By: Dellie Catholic on 08/16/2021 11:57:02 ?-------------------------------------------------------------------------------- ?HPI Details ?Patient Name: ?Date of Service: ?[name], [place] [name] 08/16/2021 11:00 A M ?Medical Record Number: [id number] ?Patient Account Number: [id number] ?Date of Birth/Sex: ?Treating RN: ?[name], 1970 (51 y.o. F) [name], [name] ?Primary Care Provider: [name] ?Other Clinician: ?Referring Provider: ?Treating Provider/Extender: Fredirick Maudlin ?[name] ?Weeks in Treatment: 17 ?History of Present Illness ?Location: medial and lateral ankle region on the right and left medial malleolus ?Quality: Patient reports experiencing a shooting pain to affected area(s). ?Severity: Patient states wound(s) are getting worse. ?Duration: right lower extremity bimalleolar ulcers have been present for approximately 2 years; the rright meedial malleolus ulcer has been there proximally 6 ?months ?Timing: Pain in wound is constant (hurts all the time) ?Context: The wound would happen gradually ?ssociated Signs and Symptoms: Patient reports having increase discharge. ?A ?HPI Description: 51 year old patient with a history of sickle cell anemia who was last seen by me with ulceration of the right lower extremity above the ankle ?and was referred to Dr. Leland Johns for a surgical debridement as I was unable to do anything in the office due to excruciating pain. At that stage she was ?referred from the plastic surgery service to dermatology who treated her for a skin infection with doxycycline and then Levaquin and a local antibiotic ?ointment. I understand the patient has since developed ulceration on the left ankle both medial and lateral and was now referred back to the wound center as ?dermatology has finished the management. I do not have any notes from the dermatology department ?Old  notes: ?51 year old patient with a history of sickle cell anemia, pain bilateral lower extremities, right lower extremity ulcer and has a history of receiving a skin graft( ?[place]) several months ago. She has been visiting the wound center We

## 2021-08-16 NOTE — Progress Notes (Signed)
?Patient [facility] ?[facility] ? ? ? ?Subjective:  ?Patient ID: [name], female    DOB: 07/24/1970  Age: 51 y.o. MRN: [id number] ? ?CC:  ?Chief Complaint  ?Patient presents with  ? Follow-up  ?  Pt is here for 1 month follow up. Pt requesting a refill on ibuprofen, oxycodone pt staated she need a different brand to juven the insurance don't cover juven  ? ? ?HPI ?[name] is a very pleasant 51 year old female with a medical history significant for sickle cell disease type SS, chronic pain syndrome, anemia of chronic disease, opiate dependence and tolerance, and history of nonhealing bilateral lower extremity ulcers presents for a follow-up of chronic conditions. ? ?Patient has chronic lower extremity ulcers.  Patient is followed by wound care weekly.  On 07/25/2021, patient was evaluated by plastic surgeon who recommended dermatology follow-up.  Patient has not been able to schedule with dermatology due to insurance constraints.  Wounds are slowly healing and are evaluated and wrapped with antibiotics weekly by her wound care specialist.  She is not complaining of any odor or drainage at this time. ?Patient denies any fever or chills. ? ?History of sickle cell disease with chronic pain syndrome.  Patient has bilateral hip pain and bilateral lower extremity pain that is moderately controlled on oxycodone and ibuprofen.  Patient last had oxycodone this a.m. with maximum relief.  She is also taking folic acid daily.  Randie Heinz was prescribed 1 month ago, patient has not received medication.  She denies headache, blurred vision, chest pain, nausea, vomiting, or diarrhea. ? ?Past Medical History:  ?Diagnosis Date  ? Anemia 03/30/2012  ? Hx of sickle cell disease  ? CAP (community acquired pneumonia)   ? 2014  ? Cholelithiasis 03/30/2012  ? Chronic wound of extremity 04/01/2012  ? Elevated LFTs   ? Leg ulcer ([place]) 10/27/2012  ? Chronic under care of wound clinic  ? Multiple open wounds  of lower extremity   ? chronic wounds B/LLE  ? Sickle cell disease ([place])   ? Sinus bradycardia by electrocardiogram 04/03/2012  ? ? ?Past Surgical History:  ?Procedure Laterality Date  ? ALLOGRAFT APPLICATION Right 5/85/2778  ? Procedure: SURGICAL PREP FOR GRAFTING RIGHT LOWER EXTREMITY AND APPLICATION OF Jannifer Hick;  Surgeon: Irene Limbo, MD;  Location: [facility];  Service: Plastics;  Laterality: Right;  ? APPLICATION OF A-CELL OF EXTREMITY Right 10/09/2015  ? Procedure: APPLICATION OF Jannifer Hick;  Surgeon: Irene Limbo, MD;  Location: [facility];  Service: Plastics;  Laterality: Right;  ? BREAST SURGERY    ? left breast cyst aspiration  ? CESAREAN SECTION    ? CESAREAN SECTION N/A 01/06/2014  ? Procedure: CESAREAN SECTION;  Surgeon: Emily Filbert, MD;  Location: [facility];  Service: Obstetrics;  Laterality: N/A;  ? CESAREAN SECTION N/A 05/04/2017  ? Procedure: CESAREAN SECTION;  Surgeon: Woodroe Mode, MD;  Location: [facility];  Service: Obstetrics;  Laterality: N/A;  ? I & D EXTREMITY Right 10/09/2015  ? Procedure: SURGICAL PREPARATION FOR GRAFTING RIGHT ANKLE AND APPLICATION THERASKIN;  Surgeon: Irene Limbo, MD;  Location: [facility];  Service: Plastics;  Laterality: Right;  ? SKIN FULL THICKNESS GRAFT Bilateral 06/17/2016  ? Procedure: SURGICAL PREP FOR GRAFTING, BILATERAL LOWER EXTREMITIES AND APPLICATION OF Jannifer Hick;  Surgeon: Irene Limbo, MD;  Location: [facility];  Service: Plastics;  Laterality: Bilateral;  ? TONSILLECTOMY    ? ? ?Family History  ?Problem Relation Age  of Onset  ? Sickle cell anemia Sister   ? Diabetes Mother   ? Asthma Mother   ? Sickle cell trait Mother   ? Sickle cell trait Father   ? Hypertension Father   ? ? ?Social History  ? ?Socioeconomic History  ? Marital status: Married  ?  Spouse name: [name]  ? Number of children: 1  ? Years of education: Not on file  ? Highest education level: Not on file  ?Occupational  History  ? Occupation: Employed in home care.  ?Tobacco Use  ? Smoking status: Never  ?  Passive exposure: Never  ? Smokeless tobacco: Never  ?Vaping Use  ? Vaping Use: Never used  ?Substance and Sexual Activity  ? Alcohol use: No  ? Drug use: No  ? Sexual activity: Yes  ?  Birth control/protection: None  ?Other Topics Concern  ? Not on file  ?Social History Narrative  ? Lives with husband.  ? ?Social Determinants of Health  ? ?Financial Resource Strain: Not on file  ?Food Insecurity: Not on file  ?Transportation Needs: Not on file  ?Physical Activity: Not on file  ?Stress: Not on file  ?Social Connections: Not on file  ?Intimate Partner Violence: Not on file  ? ? ?Outpatient Medications Prior to Visit  ?Medication Sig Dispense Refill  ? aspirin EC 81 MG tablet Take 1 tablet (81 mg total) by mouth daily. 30 tablet 11  ? folic acid (FOLVITE) 1 MG tablet Take 5 tablets (5 mg total) by mouth daily. (Patient taking differently: Take 1 mg by mouth daily.) 150 tablet 11  ? gabapentin (NEURONTIN) 300 MG capsule TAKE 1 CAPSULE(300 MG) BY MOUTH THREE TIMES DAILY 90 capsule 2  ? Nutritional Supplements (JUVEN) POWD Take 1 each by mouth 3 (three) times daily between meals. 545 g 6  ? ibuprofen (ADVIL) 600 MG tablet Take 1 tablet (600 mg total) by mouth every 6 (six) hours as needed for mild pain. 30 tablet 3  ? Oxycodone HCl 10 MG TABS Take 1 tablet (10 mg total) by mouth every 4 (four) hours while awake. 90 tablet 0  ? Deferiprone 500 MG TABS Take 500 mg by mouth daily at 12 noon. (Patient not taking: Reported on 07/25/2021) 90 tablet 2  ? voxelotor (OXBRYTA) 500 MG TABS tablet Take 1,000 mg by mouth daily. (Patient not taking: Reported on 08/16/2021) 90 tablet 1  ? feeding supplement (ENSURE ENLIVE / ENSURE PLUS) LIQD Take 237 mLs by mouth 2 (two) times daily between meals. (Patient not taking: Reported on 08/16/2021) 237 mL 12  ? ?No facility-administered medications prior to visit.  ? ? ?No Known Allergies ? ?ROS ?Review of  Systems  ?Constitutional: Negative.   ?HENT: Negative.    ?Eyes: Negative.   ?Respiratory: Negative.    ?Cardiovascular: Negative.   ?Gastrointestinal: Negative.   ?Genitourinary: Negative.   ?Musculoskeletal:  Positive for joint swelling.  ?Neurological: Negative.   ?Hematological: Negative.   ?Psychiatric/Behavioral: Negative.    ? ?  ?Objective:  ?  ?Physical Exam ?Constitutional:   ?   Appearance: Normal appearance.  ?Eyes:  ?   Pupils: Pupils are equal, round, and reactive to light.  ?Cardiovascular:  ?   Rate and Rhythm: Normal rate and regular rhythm.  ?Pulmonary:  ?   Effort: Pulmonary effort is normal.  ?   Breath sounds: Normal breath sounds.  ?Abdominal:  ?   General: Bowel sounds are normal.  ?Skin: ?   General: Skin is warm.  ?  Findings: Wound (Patient has nonhealing bilateral lower extremity ulcers.  Ulcers are wrapped weekly by wound care specialist, unable to visualize at this time.) present.  ?Neurological:  ?   General: No focal deficit present.  ?   Mental Status: She is alert. Mental status is at baseline.  ?Psychiatric:     ?   Mood and Affect: Mood normal.     ?   Behavior: Behavior normal.     ?   Thought Content: Thought content normal.     ?   Judgment: Judgment normal.  ? ? ?BP 123/69 (BP Location: Left Arm, Patient Position: Sitting, Cuff Size: Normal)   Pulse 63   Temp (!) 97.5 ?F (36.4 ?C)   Ht '5\' 7"'$  (1.702 m)   Wt 132 lb (59.9 kg)   SpO2 100%   BMI 20.67 kg/m?  ?Wt Readings from Last 3 Encounters:  ?08/16/21 132 lb (59.9 kg)  ?07/25/21 131 lb 12.8 oz (59.8 kg)  ?07/17/21 131 lb 3.2 oz (59.5 kg)  ? ? ? ?Health Maintenance Due  ?Topic Date Due  ? COVID-19 Vaccine (1) Never done  ? COLONOSCOPY (Pts 45-25yr Insurance coverage will need to be confirmed)  Never done  ? PAP SMEAR-Modifier  12/17/2019  ? ? ?There are no preventive care reminders to display for this patient. ? ?Lab Results  ?Component Value Date  ? TSH 0.099 (L) 03/20/2017  ? ?Lab Results  ?Component Value Date  ? WBC  14.8 (H) 07/17/2021  ? HGB 7.2 (L) 07/17/2021  ? HCT 20.6 (L) 07/17/2021  ? MCV 86 07/17/2021  ? PLT 419 07/17/2021  ? ?Lab Results  ?Component Value Date  ? NA 138 07/17/2021  ? K 4.3 07/17/2021  ? CHLORIDE

## 2021-08-17 ENCOUNTER — Other Ambulatory Visit: Payer: Self-pay | Admitting: Family Medicine

## 2021-08-17 ENCOUNTER — Telehealth: Payer: Self-pay

## 2021-08-17 DIAGNOSIS — N644 Mastodynia: Secondary | ICD-10-CM

## 2021-08-17 LAB — CMP14+CBC/D/PLT+FER+RETIC+V...
ALT: 23 IU/L (ref 0–32)
AST: 48 IU/L — ABNORMAL HIGH (ref 0–40)
Albumin/Globulin Ratio: 1.6 (ref 1.2–2.2)
Albumin: 4.6 g/dL (ref 3.8–4.8)
Alkaline Phosphatase: 72 IU/L (ref 44–121)
BUN/Creatinine Ratio: 13 (ref 9–23)
BUN: 7 mg/dL (ref 6–24)
Basophils Absolute: 0.2 10*3/uL (ref 0.0–0.2)
Basos: 2 %
Bilirubin Total: 3.6 mg/dL — ABNORMAL HIGH (ref 0.0–1.2)
CO2: 20 mmol/L (ref 20–29)
Calcium: 9.3 mg/dL (ref 8.7–10.2)
Chloride: 102 mmol/L (ref 96–106)
Creatinine, Ser: 0.52 mg/dL — ABNORMAL LOW (ref 0.57–1.00)
EOS (ABSOLUTE): 0.6 10*3/uL — ABNORMAL HIGH (ref 0.0–0.4)
Eos: 5 %
Ferritin: 1808 ng/mL — ABNORMAL HIGH (ref 15–150)
Globulin, Total: 2.9 g/dL (ref 1.5–4.5)
Glucose: 72 mg/dL (ref 70–99)
Hematocrit: 21.5 % — ABNORMAL LOW (ref 34.0–46.6)
Hemoglobin: 7.9 g/dL — ABNORMAL LOW (ref 11.1–15.9)
Immature Grans (Abs): 0.1 10*3/uL (ref 0.0–0.1)
Immature Granulocytes: 1 %
Lymphocytes Absolute: 2.5 10*3/uL (ref 0.7–3.1)
Lymphs: 22 %
MCH: 31.3 pg (ref 26.6–33.0)
MCHC: 36.7 g/dL — ABNORMAL HIGH (ref 31.5–35.7)
MCV: 85 fL (ref 79–97)
Monocytes Absolute: 1.4 10*3/uL — ABNORMAL HIGH (ref 0.1–0.9)
Monocytes: 13 %
NRBC: 24 % — ABNORMAL HIGH (ref 0–0)
Neutrophils Absolute: 6.6 10*3/uL (ref 1.4–7.0)
Neutrophils: 57 %
Platelets: 491 10*3/uL — ABNORMAL HIGH (ref 150–450)
Potassium: 4.5 mmol/L (ref 3.5–5.2)
RBC: 2.52 x10E6/uL — CL (ref 3.77–5.28)
RDW: 20.4 % — ABNORMAL HIGH (ref 11.7–15.4)
Retic Ct Pct: >30 % — ABNORMAL HIGH (ref 0.6–2.6)
Sodium: 139 mmol/L (ref 134–144)
Total Protein: 7.5 g/dL (ref 6.0–8.5)
Vit D, 25-Hydroxy: 22.5 ng/mL — ABNORMAL LOW (ref 30.0–100.0)
WBC: 11.4 10*3/uL — ABNORMAL HIGH (ref 3.4–10.8)
eGFR: 114 mL/min/{1.73_m2} (ref >59)

## 2021-08-17 NOTE — Progress Notes (Signed)
Orders Placed This Encounter  ?Procedures  ? MM Digital Diagnostic Bilat  ?  Standing Status:   Future  ?  Standing Expiration Date:   08/18/2022  ?  Order Specific Question:   Reason for Exam (SYMPTOM  OR DIAGNOSIS REQUIRED)  ?  Answer:   Bilateral breast pain  ?  Order Specific Question:   Is the patient pregnant?  ?  Answer:   No  ?  Order Specific Question:   Preferred imaging location?  ?  Answer:   [facility]  ? [name]  APRN, MSN, FNP-C ?Patient [place] ?[facility] ?[address]  ?[place], [place] [address] ?[phone] ? ?

## 2021-08-17 NOTE — Telephone Encounter (Signed)
Pt called and said that the Breast center is asking for a DIAGNOTIC code to be put in for her mammogram ? ? ?

## 2021-08-23 ENCOUNTER — Encounter (HOSPITAL_BASED_OUTPATIENT_CLINIC_OR_DEPARTMENT_OTHER): Payer: Medicaid Other | Attending: General Surgery | Admitting: General Surgery

## 2021-08-23 DIAGNOSIS — L97818 Non-pressure chronic ulcer of other part of right lower leg with other specified severity: Secondary | ICD-10-CM | POA: Diagnosis present

## 2021-08-23 DIAGNOSIS — L97828 Non-pressure chronic ulcer of other part of left lower leg with other specified severity: Secondary | ICD-10-CM | POA: Diagnosis not present

## 2021-08-23 DIAGNOSIS — D571 Sickle-cell disease without crisis: Secondary | ICD-10-CM | POA: Insufficient documentation

## 2021-08-23 NOTE — Progress Notes (Signed)
TAFFIE, ECKMANN ([id number]) ?Visit Report for 08/23/2021 ?Arrival Information Details ?Patient Name: Date of Service: ?[name], [place] [name] 08/23/2021 10:15 A M ?Medical Record Number: [id number] ?Patient Account Number: [id number] ?Date of Birth/Sex: Treating RN: ?[birth year] (51 y.o. F) [name], [name] ?Primary Care Brentt Fread: [name] Other Clinician: ?Referring Aylanie Cubillos: ?Treating Hadassah Rana/Extender: Fredirick Maudlin ?[name] ?Weeks in Treatment: 18 ?Visit Information History Since Last Visit ?Added or deleted any medications: No ?Patient Arrived: Wheel Chair ?Any new allergies or adverse reactions: No ?Arrival Time: 10:25 ?Had a fall or experienced change in No ?Accompanied By: self ?activities of daily living that may affect ?Transfer Assistance: None ?risk of falls: ?Patient Identification Verified: Yes ?Signs or symptoms of abuse/neglect since last visito No ?Patient Requires Transmission-Based Precautions: No ?Hospitalized since last visit: No ?Patient Has Alerts: No ?Implantable device outside of the clinic excluding No ?cellular tissue based products placed in the center ?since last visit: ?Has Dressing in Place as Prescribed: Yes ?Pain Present Now: No ?Electronic Signature(s) ?Signed: 08/23/2021 5:38:07 PM By: Dellie Catholic RN ?Entered By: Dellie Catholic on 08/23/2021 10:54:46 ?-------------------------------------------------------------------------------- ?Encounter Discharge Information Details ?Patient Name: Date of Service: ?[name], [place] [name] 08/23/2021 10:15 A M ?Medical Record Number: [id number] ?Patient Account Number: [id number] ?Date of Birth/Sex: Treating RN: ?1970/10/07 (51 y.o. F) Sharyn Creamer ?Primary Care Dave Mannes: [name] Other Clinician: ?Referring Randen Kauth: ?Treating Azaylia Fong/Extender: Fredirick Maudlin ?[name] ?Weeks in Treatment: 18 ?Encounter Discharge Information Items Post Procedure Vitals ?Discharge Condition: Stable ?Temperature (F): 98.3 ?Ambulatory Status:  Wheelchair ?Pulse (bpm): 73 ?Discharge Destination: Home ?Respiratory Rate (breaths/min): 16 ?Transportation: Other ?Blood Pressure (mmHg): 125/74 ?Accompanied By: self ?Schedule Follow-up Appointment: Yes ?Clinical Summary of Care: Patient Declined ?Electronic Signature(s) ?Signed: 08/23/2021 4:34:37 PM By: Adline Peals ?Entered By: Adline Peals on 08/23/2021 11:39:16 ?-------------------------------------------------------------------------------- ?Lower Extremity Assessment Details ?Patient Name: ?Date of Service: ?[name], [place] [name] 08/23/2021 10:15 A M ?Medical Record Number: [id number] ?Patient Account Number: [id number] ?Date of Birth/Sex: ?Treating RN: ?1970-11-26 (51 y.o. F) [name], [name] ?Primary Care Moraima Burd: [name] ?Other Clinician: ?Referring Dejanique Ruehl: ?Treating Diondre Pulis/Extender: Fredirick Maudlin ?[name] ?Weeks in Treatment: 18 ?Edema Assessment ?Assessed: [Left: No] [Right: No] ?Edema: [Left: No] [Right: No] ?Calf ?Left: Right: ?Point of Measurement: 33 cm From Medial Instep 27 cm 27.6 cm ?Ankle ?Left: Right: ?Point of Measurement: 10 cm From Medial Instep 16.5 cm 20.5 cm ?Electronic Signature(s) ?Signed: 08/23/2021 5:38:07 PM By: Dellie Catholic RN ?Entered By: Dellie Catholic on 08/23/2021 10:57:04 ?-------------------------------------------------------------------------------- ?Multi Wound Chart Details ?Patient Name: ?Date of Service: ?[name], [place] [name] 08/23/2021 10:15 A M ?Medical Record Number: [id number] ?Patient Account Number: [id number] ?Date of Birth/Sex: ?Treating RN: ?07-19-1970 (51 y.o. F) Sharyn Creamer ?Primary Care Derricka Mertz: [name] ?Other Clinician: ?Referring Hayzel Ruberg: ?Treating Dezhane Staten/Extender: Fredirick Maudlin ?[name] ?Weeks in Treatment: 18 ?Vital Signs ?Height(in): 67 ?Pulse(bpm): 73 ?Weight(lbs): ?Blood Pressure(mmHg): 125/74 ?Body Mass Index(BMI): ?Temperature(??F): 98.3 ?Respiratory Rate(breaths/min): 16 ?Photos: ?Right, Lateral Lower  Leg Left, Medial Ankle Right, Medial Ankle ?Wound Location: ?Gradually Appeared Gradually Appeared Gradually Appeared ?Wounding Event: ?Sickle Cell Lesion Sickle Cell Lesion Sickle Cell Lesion ?Primary Etiology: ?N/A N/A Venous Leg Ulcer ?Secondary Etiology: ?Anemia, Sickle Cell Disease, Anemia, Sickle Cell Disease, Anemia, Sickle Cell Disease, ?Comorbid History: ?Neuropathy Neuropathy Neuropathy ?10/05/2012 12/25/2020 06/26/2021 ?Date Acquired: ?'18 18 8 '$ ?Weeks of Treatment: ?Open Open Open ?Wound Status: ?No No No ?Wound Recurrence: ?Yes No No ?Clustered Wound: ?8x4.5x0.1 1.8x0.6x0.1 5.5x2.8x0.1 ?Measurements L x W x D (cm) ?28.274 0.848 12.095 ?A (cm?) : ?rea ?2.827 0.085 1.21 ?Volume (  cm?) : ?85.00% 96.00% 58.30% ?% Reduction in Area: ?85.00% 99.40% 58.20% ?% Reduction in Volume: ?Full Thickness Without Exposed Full Thickness Without Exposed Full Thickness Without Exposed ?Classification: ?Support Structures Support Structures Support Structures ?Large Medium Large ?Exudate A mount: ?Purulent Serosanguineous Purulent ?Exudate Type: ?yellow, brown, green red, brown yellow, brown, green ?Exudate Color: ?Flat and Intact Flat and Intact Distinct, outline attached ?Wound Margin: ?Small (1-33%) Medium (34-66%) Medium (34-66%) ?Granulation A mount: ?Pink Red, Pink, Hyper-granulation Pink ?Granulation Quality: ?Large (67-100%) Medium (34-66%) Medium (34-66%) ?Necrotic A mount: ?Fat Layer (Subcutaneous Tissue): Yes Fat Layer (Subcutaneous Tissue): Yes Fat Layer (Subcutaneous Tissue): Yes ?Exposed Structures: ?Fascia: No ?Fascia: No ?Fascia: No ?Tendon: No ?Tendon: No ?Tendon: No ?Muscle: No ?Muscle: No ?Muscle: No ?Joint: No ?Joint: No ?Joint: No ?Bone: No ?Bone: No ?Bone: No ?Medium (34-66%) Small (1-33%) Small (1-33%) ?Epithelialization: ?Debridement - Excisional Debridement - Excisional Debridement - Excisional ?Debridement: ?Pre-procedure Verification/Time Out 10:56 10:56 10:56 ?Taken: ?Lidocaine 5% topical ointment  Lidocaine 5% topical ointment Lidocaine 5% topical ointment ?Pain Control: ?Subcutaneous, Slough Subcutaneous, [employer], [place] ?Tissue Debrided: ?Skin/Subcutaneous Tissue Skin/Subcutaneous Tissue Skin/Subcutaneous Tissue ?Level: ?36 1.08 15.4 ?Debridement A (sq cm): ?rea ?Curette Curette Curette ?Instrument: ?Minimum Minimum Minimum ?Bleeding: ?Pressure Pressure Pressure ?Hemostasis A chieved: ?0 0 0 ?Procedural Pain: ?0 0 0 ?Post Procedural Pain: ?Procedure was tolerated well Procedure was tolerated well Procedure was tolerated well ?Debridement Treatment Response: ?8x4.5x0.1 1.8x0.6x0.1 5.5x2.8x0.1 ?Post Debridement Measurements L x ?W x D (cm) ?2.827 0.085 1.21 ?Post Debridement Volume: (cm?) ?Debridement Debridement Debridement ?Procedures Performed: ?Treatment Notes ?Wound #17 (Lower Leg) Wound Laterality: Right, Lateral ?Cleanser ?Soap and Water ?Discharge Instruction: May shower and wash wound with dial antibacterial soap and water prior to dressing change. ?Wound Cleanser ?Discharge Instruction: Cleanse the wound with wound cleanser prior to applying a clean dressing using gauze sponges, not tissue or cotton balls. ?Peri-Wound Care ?Triamcinolone 15 (g) ?Discharge Instruction: Use triamcinolone 15 (g) as directed ?Sween Lotion (Moisturizing lotion) ?Discharge Instruction: Apply moisturizing lotion as directed ?Topical ?Gentamicin ?Discharge Instruction: As directed by physician ?Primary Dressing ?Promogran Prisma Matrix, 4.34 (sq in) (silver collagen) ?Discharge Instruction: Moisten collagen with saline or hydrogel ?Secondary Dressing ?ABD Pad, 5x9 ?Discharge Instruction: Apply over primary dressing as directed. ?Zetuvit Plus 4x8 in ?Discharge Instruction: Apply over primary dressing as directed. ?Secured With ?Compression Wrap ?Kerlix Roll 4.5x3.1 (in/yd) ?Discharge Instruction: Apply Kerlix and Coban compression as directed. ?Coban Self-Adherent Wrap 4x5 (in/yd) ?Discharge Instruction: Apply  over Kerlix as directed. ?Compression Stockings ?Add-Ons ?Wound #19 (Ankle) Wound Laterality: Left, Medial ?Cleanser ?Soap and Water ?Discharge Instruction: May shower and wash wound with dial antibacterial soap

## 2021-08-23 NOTE — Progress Notes (Signed)
[name], [name] ([id number]) ?Visit Report for 08/23/2021 ?Chief Complaint Document Details ?Patient Name: Date of Service: ?[name], [place] [name] 08/23/2021 10:15 A M ?Medical Record Number: [id number] ?Patient Account Number: [id number] ?Date of Birth/Sex: Treating RN: ?10-Oct-1970 (51 y.o. F) Sharyn Creamer ?Primary Care Provider: [name] Other Clinician: ?Referring Provider: ?Treating Provider/Extender: Fredirick Maudlin ?[name] ?Weeks in Treatment: 18 ?Information Obtained from: Patient ?Chief Complaint ?the patient is here for evaluation of her bilateral lower extremity sickle cell ulcers ?04/17/2021; patient comes in for substantial wounds on the right and left lower leg ?Electronic Signature(s) ?Signed: 08/23/2021 11:54:05 AM By: Fredirick Maudlin MD FACS ?Entered By: Fredirick Maudlin on 08/23/2021 11:54:05 ?-------------------------------------------------------------------------------- ?Debridement Details ?Patient Name: Date of Service: ?[name], [place] [name] 08/23/2021 10:15 A M ?Medical Record Number: [id number] ?Patient Account Number: [id number] ?Date of Birth/Sex: Treating RN: ?[birth year] (51 y.o. F) [name] ?Primary Care Provider: [name] Other Clinician: ?Referring Provider: ?Treating Provider/Extender: Fredirick Maudlin ?[name] ?Weeks in Treatment: 18 ?Debridement Performed for Assessment: Wound #17 Right,Lateral Lower Leg ?Performed By: Physician Fredirick Maudlin, MD ?Debridement Type: Debridement ?Level of Consciousness (Pre-procedure): Awake and Alert ?Pre-procedure Verification/Time Out Yes - 10:56 ?Taken: ?Start Time: 10:56 ?Pain Control: Lidocaine 5% topical ointment ?T Area Debrided (L x W): ?otal 8 (cm) x 4.5 (cm) = 36 (cm?) ?Tissue and other material debrided: ?Non-Viable, Slough, Subcutaneous, Skin: Dermis , Slough ?Level: Skin/Subcutaneous Tissue ?Debridement Description: Excisional ?Instrument: Curette ?Bleeding: Minimum ?Hemostasis Achieved: Pressure ?End Time:  10:58 ?Procedural Pain: 0 ?Post Procedural Pain: 0 ?Response to Treatment: Procedure was tolerated well ?Level of Consciousness (Post- Awake and Alert ?procedure): ?Post Debridement Measurements of Total Wound ?Length: (cm) 8 ?Width: (cm) 4.5 ?Depth: (cm) 0.1 ?Volume: (cm?) 2.827 ?Character of Wound/Ulcer Post Debridement: Improved ?Post Procedure Diagnosis ?Same as Pre-procedure ?Electronic Signature(s) ?Signed: 08/23/2021 5:21:29 PM By: Fredirick Maudlin MD FACS ?Signed: 08/23/2021 5:38:07 PM By: Dellie Catholic RN ?Entered By: Dellie Catholic on 08/23/2021 11:01:16 ?-------------------------------------------------------------------------------- ?Debridement Details ?Patient Name: ?Date of Service: ?[name], [place] [name] 08/23/2021 10:15 A M ?Medical Record Number: [id number] ?Patient Account Number: [id number] ?Date of Birth/Sex: ?Treating RN: ?1971/04/29 (51 y.o. F) [name] ?Primary Care Provider: [name] ?Other Clinician: ?Referring Provider: ?Treating Provider/Extender: Fredirick Maudlin ?[name] ?Weeks in Treatment: 18 ?Debridement Performed for Assessment: Wound #19 Left,Medial Ankle ?Performed By: Physician Fredirick Maudlin, MD ?Debridement Type: Debridement ?Level of Consciousness (Pre-procedure): Awake and Alert ?Pre-procedure Verification/Time Out Yes - 10:56 ?Taken: ?Start Time: 10:56 ?Pain Control: Lidocaine 5% topical ointment ?T Area Debrided (L x W): ?otal 1.8 (cm) x 0.6 (cm) = 1.08 (cm?) ?Tissue and other material debrided: ?Non-Viable, Slough, Subcutaneous, Skin: Dermis , Slough ?Level: Skin/Subcutaneous Tissue ?Debridement Description: Excisional ?Instrument: Curette ?Bleeding: Minimum ?Hemostasis Achieved: Pressure ?End Time: 10:58 ?Procedural Pain: 0 ?Post Procedural Pain: 0 ?Response to Treatment: Procedure was tolerated well ?Level of Consciousness (Post- Awake and Alert ?procedure): ?Post Debridement Measurements of Total Wound ?Length: (cm) 1.8 ?Width: (cm) 0.6 ?Depth: (cm)  0.1 ?Volume: (cm?) 0.085 ?Character of Wound/Ulcer Post Debridement: Improved ?Post Procedure Diagnosis ?Same as Pre-procedure ?Electronic Signature(s) ?Signed: 08/23/2021 5:21:29 PM By: Fredirick Maudlin MD FACS ?Signed: 08/23/2021 5:38:07 PM By: Dellie Catholic RN ?Entered By: Dellie Catholic on 08/23/2021 11:02:19 ?-------------------------------------------------------------------------------- ?Debridement Details ?Patient Name: ?Date of Service: ?[name], [place] [name] 08/23/2021 10:15 A M ?Medical Record Number: [id number] ?Patient Account Number: [id number] ?Date of Birth/Sex: ?Treating RN: ?17-Apr-1971 (50 y.o. F) [name] ?Primary Care Provider: [name] ?Other Clinician: ?Referring Provider: ?Treating Provider/Extender: Fredirick Maudlin ?[name] Robert,  [name] ?Weeks in Treatment: 18 ?Debridement Performed for Assessment: Wound #21 Right,Medial Ankle ?Performed By: Physician Fredirick Maudlin, MD ?Debridement Type: Debridement ?Severity of Tissue Pre Debridement: Fat layer exposed ?Level of Consciousness (Pre-procedure): Awake and Alert ?Pre-procedure Verification/Time Out Yes - 10:56 ?Taken: ?Start Time: 10:56 ?Pain Control: Lidocaine 5% topical ointment ?T Area Debrided (L x W): ?otal 5.5 (cm) x 2.8 (cm) = 15.4 (cm?) ?Tissue and other material debrided: ?Non-Viable, Slough, Subcutaneous, Skin: Dermis , Slough ?Level: Skin/Subcutaneous Tissue ?Debridement Description: Excisional ?Instrument: Curette ?Bleeding: Minimum ?Hemostasis Achieved: Pressure ?End Time: 10:58 ?Procedural Pain: 0 ?Post Procedural Pain: 0 ?Response to Treatment: Procedure was tolerated well ?Level of Consciousness (Post- Awake and Alert ?procedure): ?Post Debridement Measurements of Total Wound ?Length: (cm) 5.5 ?Width: (cm) 2.8 ?Depth: (cm) 0.1 ?Volume: (cm?) 1.21 ?Character of Wound/Ulcer Post Debridement: Improved ?Severity of Tissue Post Debridement: Fat layer exposed ?Post Procedure Diagnosis ?Same as Pre-procedure ?Electronic  Signature(s) ?Signed: 08/23/2021 5:21:29 PM By: Fredirick Maudlin MD FACS ?Signed: 08/23/2021 5:38:07 PM By: Dellie Catholic RN ?Entered By: Dellie Catholic on 08/23/2021 11:03:31 ?-------------------------------------------------------------------------------- ?HPI Details ?Patient Name: ?Date of Service: ?[name], [place] [name] 08/23/2021 10:15 A M ?Medical Record Number: [id number] ?Patient Account Number: [id number] ?Date of Birth/Sex: ?Treating RN: ?24-Jul-1970 (51 y.o. F) Sharyn Creamer ?Primary Care Provider: [name] ?Other Clinician: ?Referring Provider: ?Treating Provider/Extender: Fredirick Maudlin ?[name] ?Weeks in Treatment: 18 ?History of Present Illness ?Location: medial and lateral ankle region on the right and left medial malleolus ?Quality: Patient reports experiencing a shooting pain to affected area(s). ?Severity: Patient states wound(s) are getting worse. ?Duration: right lower extremity bimalleolar ulcers have been present for approximately 2 years; the rright meedial malleolus ulcer has been there proximally 6 ?months ?Timing: Pain in wound is constant (hurts all the time) ?Context: The wound would happen gradually ?ssociated Signs and Symptoms: Patient reports having increase discharge. ?A ?HPI Description: 51 year old patient with a history of sickle cell anemia who was last seen by me with ulceration of the right lower extremity above the ankle ?and was referred to Dr. Leland Johns for a surgical debridement as I was unable to do anything in the office due to excruciating pain. At that stage she was ?referred from the plastic surgery service to dermatology who treated her for a skin infection with doxycycline and then Levaquin and a local antibiotic ?ointment. I understand the patient has since developed ulceration on the left ankle both medial and lateral and was now referred back to the wound center as ?dermatology has finished the management. I do not have any notes from the dermatology  department ?Old notes: ?51 year old patient with a history of sickle cell anemia, pain bilateral lower extremities, right lower extremity ulcer and has a history of receiving a skin graft( ?[employer]) several months ago. She h

## 2021-08-24 LAB — DRUG SCREEN 764883 11+OXYCO+ALC+CRT-BUND
Amphetamines, Urine: NEGATIVE ng/mL
BENZODIAZ UR QL: NEGATIVE ng/mL
Barbiturate: NEGATIVE ng/mL
Cannabinoid Quant, Ur: NEGATIVE ng/mL
Cocaine (Metabolite): NEGATIVE ng/mL
Creatinine: 78.9 mg/dL (ref 20.0–300.0)
Ethanol: NEGATIVE %
Meperidine: NEGATIVE ng/mL
Methadone Screen, Urine: NEGATIVE ng/mL
Phencyclidine: NEGATIVE ng/mL
Propoxyphene: NEGATIVE ng/mL
Tramadol: NEGATIVE ng/mL
pH, Urine: 5.8 (ref 4.5–8.9)

## 2021-08-24 LAB — OXYCODONE/OXYMORPHONE, CONFIRM
OXYCODONE/OXYMORPH: POSITIVE — AB
OXYCODONE: 2490 ng/mL
OXYCODONE: POSITIVE — AB
OXYMORPHONE (GC/MS): 2886 ng/mL
OXYMORPHONE: POSITIVE — AB

## 2021-08-24 LAB — OPIATES CONFIRMATION, URINE: Opiates: NEGATIVE ng/mL

## 2021-08-27 ENCOUNTER — Other Ambulatory Visit: Payer: Self-pay | Admitting: Family Medicine

## 2021-08-27 DIAGNOSIS — N644 Mastodynia: Secondary | ICD-10-CM

## 2021-08-28 NOTE — Telephone Encounter (Signed)
No additional notes needed  

## 2021-08-30 ENCOUNTER — Encounter (HOSPITAL_BASED_OUTPATIENT_CLINIC_OR_DEPARTMENT_OTHER): Payer: Medicaid Other | Admitting: General Surgery

## 2021-08-30 DIAGNOSIS — L97818 Non-pressure chronic ulcer of other part of right lower leg with other specified severity: Secondary | ICD-10-CM | POA: Diagnosis not present

## 2021-08-30 NOTE — Progress Notes (Signed)
[name], [name] ([id number]) ?Visit Report for 08/30/2021 ?Arrival Information Details ?Patient Name: Date of Service: ?[name] [name] 08/30/2021 10:15 A M ?Medical Record Number: [id number] ?Patient Account Number: [id number] ?Date of Birth/Sex: Treating RN: ?Oct 17, 1970 (51 y.o. F) [name] ?Primary Care [name]: [name] Other Clinician: ?Referring [name]: ?Treating [name]/Extender: [name] ?[name] ?Weeks in Treatment: 19 ?Visit Information History Since Last Visit ?Added or deleted any medications: No ?Patient Arrived: Wheel Chair ?Any new allergies or adverse reactions: No ?Arrival Time: 09:56 ?Had a fall or experienced change in No ?Accompanied By: self ?activities of daily living that may affect ?Transfer Assistance: None ?risk of falls: ?Patient Identification Verified: Yes ?Signs or symptoms of abuse/neglect since last visito No ?Secondary Verification Process Completed: Yes ?Hospitalized since last visit: No ?Patient Requires Transmission-Based Precautions: No ?Implantable device outside of the clinic excluding No ?Patient Has Alerts: No ?cellular tissue based products placed in the center ?since last visit: ?Has Dressing in Place as Prescribed: Yes ?Pain Present Now: No ?Electronic Signature(s) ?Signed: 08/30/2021 10:45:31 AM By: [name] ?Entered By: [name] on 08/30/2021 09:57:33 ?-------------------------------------------------------------------------------- ?Encounter Discharge Information Details ?Patient Name: Date of Service: ?[name] [name] 08/30/2021 10:15 A M ?Medical Record Number: [id number] ?Patient Account Number: [id number] ?Date of Birth/Sex: Treating RN: ?26-Feb-1971 (51 y.o. F) [name] ?Primary Care [name]: [name] Other Clinician: ?Referring Latiqua Daloia: ?Treating [name]/Extender: [name] ?[name] ?Weeks in Treatment: 19 ?Encounter Discharge Information Items Post Procedure Vitals ?Discharge Condition:  Stable ?Temperature (F): 98 ?Ambulatory Status: Wheelchair ?Pulse (bpm): 80 ?Discharge Destination: Home ?Respiratory Rate (breaths/min): 16 ?Transportation: Other ?Blood Pressure (mmHg): 118/72 ?Accompanied By: self ?Schedule Follow-up Appointment: Yes ?Clinical Summary of Care: Patient Declined ?Electronic Signature(s) ?Signed: 08/30/2021 12:40:46 PM By: [name] Catholic RN ?Entered By: [name] on 08/30/2021 12:12:17 ?-------------------------------------------------------------------------------- ?Lower Extremity Assessment Details ?Patient Name: ?Date of Service: ?[name] 08/30/2021 10:15 A M ?Medical Record Number: [id number] ?Patient Account Number: [id number] ?Date of Birth/Sex: ?Treating RN: ?Jun 16, 1970 (51 y.o. F) [place], [name] ?Primary Care [name]: [name] ?Other Clinician: ?Referring [name]: ?Treating [name]/Extender: [name] ?[name] ?Weeks in Treatment: 19 ?Edema Assessment ?Assessed: [Left: No] [Right: No] ?Edema: [Left: No] [Right: No] ?Calf ?Left: Right: ?Point of Measurement: 33 cm From Medial Instep 25.8 cm 28.4 cm ?Ankle ?Left: Right: ?Point of Measurement: 10 cm From Medial Instep 16 cm 19 cm ?Vascular Assessment ?Pulses: ?Dorsalis Pedis ?Palpable: [Left:Yes] [Right:Yes] ?Electronic Signature(s) ?Signed: 08/30/2021 6:32:21 PM By: [name] Gouty RN, BSN ?Entered By: [name] on 08/30/2021 10:51:24 ?-------------------------------------------------------------------------------- ?Multi Wound Chart Details ?Patient Name: ?Date of Service: ?[name] 08/30/2021 10:15 A M ?Medical Record Number: [id number] ?Patient Account Number: [id number] ?Date of Birth/Sex: ?Treating RN: ?Aug 15, 1970 (51 y.o. F) [name] ?Primary Care [name]: [name] ?Other Clinician: ?Referring [name]: ?Treating [name]: [name] ?[name] ?Weeks in Treatment: 19 ?Vital Signs ?Height(in): 67 ?Pulse(bpm): 80 ?Weight(lbs): ?Blood  Pressure(mmHg): 118/72 ?Body Mass Index(BMI): ?Temperature(??F): 98.0 ?Respiratory Rate(breaths/min): 16 ?Photos: [17:Right, Lateral Lower Leg] [19:Left, Medial Ankle] [21:Right, Medial Ankle] ?Wound Location: [17:Gradually Appeared] [19:Gradually Appeared] [21:Gradually Appeared] ?Wounding Event: [17:[name] Cell Lesion] [19:[name] Cell Lesion] [21:[name] Cell Lesion] ?Primary Etiology: [17:N/A] [19:N/A] [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] ?Secondary Etiology: [17:Anemia, [name] Cell Disease,] [19:Anemia, [name] Cell Disease,] [21:Anemia, [name] Cell Disease,] ?Comorbid History: [17:Neuropathy 10/05/2012] [19:Neuropathy 12/25/2020] [21:Neuropathy 06/26/2021] ?Date Acquired: [17:19] [19:19] [21:9] ?Weeks of Treatment: [17:Open] [19:Open] [21:Open] ?Wound Status: [17:No] [19:No] [21:No] ?Wound Recurrence: [17:Yes] [19:No] [21:No] ?Clustered Wound: [17:7.7x4.5x0.1] [19:1.5x0.6x0.1] [21:5.5x3x0.1] ?Measurements L x W  x D (cm) [26:33.354] [56:2.563] [21:12.959] ?A (cm?) : ?rea [17:2.721] [19:0.071] [21:1.296] ?Volume (cm?) : [17:85.50%] [19:96.70%] [21:55.30%] ?% Reduction in A [17:rea: 85.50%] [19:99.50%] [21:55.30%] ?% Reduction in Volume: [17:Full Thickness Without Exposed] [19:Full Thickness Without Exposed] [21:Full Thickness Without Exposed] ?Classification: [17:Support Structures Medium] [19:Support Structures Medium] [21:Support Structures Medium] ?Exudate A mount: [17:Purulent] [19:Serosanguineous] [21:Purulent] ?Exudate Type: [17:yellow, brown, green] [19:red, brown] [21:yellow, brown, green] ?Exudate Color: [17:Flat and Intact] [19:Flat and Intact] [21:Distinct, outline attached] ?Wound Margin: [17:Small (1-33%)] [19:Large (67-100%)] [21:Medium (34-66%)] ?Granulation A mount: [17:Pink] [19:Pink] [21:Pink] ?Granulation Quality: [17:Large (67-100%)] [19:None Present (0%)] [21:Medium (34-66%)] ?Necrotic A mount: ?[17:Fat Layer (Subcutaneous Tissue): Yes Fat Layer (Subcutaneous Tissue): Yes Fat Layer (Subcutaneous Tissue):  Yes] ?Exposed Structures: ?[17:Fascia: No Tendon: No Muscle: No Joint: No Bone: No Small (1-33%)] [19:Fascia: No Tendon: No Muscle: No Joint: No Bone: No Small (1-33%)] [21:Fascia: No Tendon: No Muscle: No Joint: No Bone: No Small (1-33%)] ?Epithelialization: [17:Debridement - Excisional] [19:Debridement - Excisional] [21:Debridement - Excisional] ?Debridement: ?Pre-procedure Verification/Time Out 11:24 [19:11:24] [21:11:24] ?Taken: [17:Lidocaine 5% topical ointment] [19:Lidocaine 5% topical ointment] [21:Lidocaine 5% topical ointment] ?Pain Control: [17:Subcutaneous, Slough] [19:Subcutaneous, Slough] [21:Subcutaneous, Slough] ?Tissue Debrided: [17:Skin/Subcutaneous Tissue] [19:Skin/Subcutaneous Tissue] [21:Skin/Subcutaneous Tissue] ?Level: [17:34.65] [19:0.9] [21:16.5] ?Debridement A (sq cm): [17:rea Curette] [19:Curette] [21:Curette] ?Instrument: [17:Minimum] [19:Minimum] [21:Minimum] ?Bleeding: [17:Pressure] [19:Pressure] [21:Pressure] ?Hemostasis A chieved: [17:0] [19:0] [21:0] ?Procedural Pain: [17:0] [19:0] [21:0] ?Post Procedural Pain: [17:Procedure was tolerated well] [19:Procedure was tolerated well] [21:Procedure was tolerated well] ?Debridement Treatment Response: [17:7.7x4.5x0.1] [19:1.5x0.6x0.1] [21:5.5x3x0.1] ?Post Debridement Measurements L x ?W x D (cm) [17:2.721] [19:0.071] [21:1.296] ?Post Debridement Volume: (cm?) [17:Debridement] [19:Debridement] [21:Debridement] ?Treatment Notes ?Wound #17 (Lower Leg) Wound Laterality: Right, Lateral ?Cleanser ?Soap and Water ?Discharge Instruction: May shower and wash wound with dial antibacterial soap and water prior to dressing change. ?Wound Cleanser ?Discharge Instruction: Cleanse the wound with wound cleanser prior to applying a clean dressing using gauze sponges, not tissue or cotton balls. ?Peri-Wound Care ?Triamcinolone 15 (g) ?Discharge Instruction: Use triamcinolone 15 (g) as directed ?Sween Lotion (Moisturizing lotion) ?Discharge Instruction: Apply  moisturizing lotion as directed ?Topical ?Gentamicin ?Discharge Instruction: As directed by physician ?Primary Dressing ?Promogran Prisma Matrix, 4.34 (sq in) (silver collagen) ?Discharge Instruction: Moisten coll

## 2021-08-30 NOTE — Progress Notes (Signed)
[name] ([id number]) ?Visit Report for 08/30/2021 ?Chief Complaint Document Details ?Patient Name: Date of Service: ?[name], [place] [name] 08/30/2021 10:15 A M ?Medical Record Number: [id number] ?Patient Account Number: [id number] ?Date of Birth/Sex: Treating RN: ?[birth year] (51 y.o. F) [name], [name] Claude ?Primary Care Provider: Cammie [name] Other Clinician: ?Referring Provider: ?Treating Provider/Extender: [name] ?[name] ?Weeks in Treatment: 19 ?Information Obtained from: Patient ?Chief Complaint ?the patient is here for evaluation of her bilateral lower extremity [name] cell ulcers ?04/17/2021; patient comes in for substantial wounds on the right and left lower leg ?Electronic Signature(s) ?Signed: 08/30/2021 12:48:49 PM By: [name] Maudlin MD FACS ?Entered By: [name] on 08/30/2021 12:48:49 ?-------------------------------------------------------------------------------- ?Debridement Details ?Patient Name: Date of Service: ?[name], [place] [name] 08/30/2021 10:15 A M ?Medical Record Number: [id number] ?Patient Account Number: [id number] ?Date of Birth/Sex: Treating RN: ?[birth year] (51 y.o. F) [name], [name] Claude ?Primary Care Provider: Cammie [name] Other Clinician: ?Referring Provider: ?Treating Provider/Extender: [name] ?[name] ?Weeks in Treatment: 19 ?Debridement Performed for Assessment: Wound #17 Right,Lateral Lower Leg ?Performed By: Physician [name] Maudlin, MD ?Debridement Type: Debridement ?Level of Consciousness (Pre-procedure): Awake and Alert ?Pre-procedure Verification/Time Out Yes - 11:24 ?Taken: ?Start Time: 11:24 ?Pain Control: Lidocaine 5% topical ointment ?T Area Debrided (L x W): ?otal 7.7 (cm) x 4.5 (cm) = 34.65 (cm?) ?Tissue and other material debrided: Non-Viable, Slough, Subcutaneous, [place] ?Level: Skin/Subcutaneous Tissue ?Debridement Description: Excisional ?Instrument: Curette ?Bleeding: Minimum ?Hemostasis Achieved: Pressure ?End Time:  11:26 ?Procedural Pain: 0 ?Post Procedural Pain: 0 ?Response to Treatment: Procedure was tolerated well ?Level of Consciousness (Post- Awake and Alert ?procedure): ?Post Debridement Measurements of Total Wound ?Length: (cm) 7.7 ?Width: (cm) 4.5 ?Depth: (cm) 0.1 ?Volume: (cm?) 2.721 ?Character of Wound/Ulcer Post Debridement: Improved ?Post Procedure Diagnosis ?Same as Pre-procedure ?Electronic Signature(s) ?Signed: 08/30/2021 12:40:46 PM By: Dellie Catholic RN ?Signed: 08/30/2021 1:06:36 PM By: [name] Maudlin MD FACS ?Entered By: Dellie Catholic on 08/30/2021 11:28:29 ?-------------------------------------------------------------------------------- ?Debridement Details ?Patient Name: ?Date of Service: ?[name], [place] [name] 08/30/2021 10:15 A M ?Medical Record Number: [id number] ?Patient Account Number: [id number] ?Date of Birth/Sex: ?Treating RN: ?13-May-1971 (51 y.o. F) [name], [name] Claude ?Primary Care Provider: Cammie [name] ?Other Clinician: ?Referring Provider: ?Treating Provider/Extender: [name] ?[name] ?Weeks in Treatment: 19 ?Debridement Performed for Assessment: Wound #19 Left,Medial Ankle ?Performed By: Physician [name] Maudlin, MD ?Debridement Type: Debridement ?Level of Consciousness (Pre-procedure): Awake and Alert ?Pre-procedure Verification/Time Out Yes - 11:24 ?Taken: ?Start Time: 11:24 ?Pain Control: Lidocaine 5% topical ointment ?T Area Debrided (L x W): ?otal 1.5 (cm) x 0.6 (cm) = 0.9 (cm?) ?Tissue and other material debrided: Non-Viable, Slough, Subcutaneous, [place] ?Level: Skin/Subcutaneous Tissue ?Debridement Description: Excisional ?Instrument: Curette ?Bleeding: Minimum ?Hemostasis Achieved: Pressure ?End Time: 11:26 ?Procedural Pain: 0 ?Post Procedural Pain: 0 ?Response to Treatment: Procedure was tolerated well ?Level of Consciousness (Post- Awake and Alert ?procedure): ?Post Debridement Measurements of Total Wound ?Length: (cm) 1.5 ?Width: (cm) 0.6 ?Depth: (cm) 0.1 ?Volume:  (cm?) 0.071 ?Character of Wound/Ulcer Post Debridement: Improved ?Post Procedure Diagnosis ?Same as Pre-procedure ?Electronic Signature(s) ?Signed: 08/30/2021 12:40:46 PM By: Dellie Catholic RN ?Signed: 08/30/2021 1:06:36 PM By: [name] Maudlin MD FACS ?Entered By: Dellie Catholic on 08/30/2021 11:29:47 ?-------------------------------------------------------------------------------- ?Debridement Details ?Patient Name: ?Date of Service: ?[name], [place] [name] 08/30/2021 10:15 A M ?Medical Record Number: [id number] ?Patient Account Number: [id number] ?Date of Birth/Sex: ?Treating RN: ?02/03/1971 (51 y.o. F) [name], [name] Claude ?Primary Care Provider: Cammie [name] ?Other Clinician: ?Referring Provider: ?Treating Provider/Extender: [name] ?[name] ?Weeks in Treatment: 19 ?Debridement  Performed for Assessment: Wound #21 Right,Medial Ankle ?Performed By: Physician [name] Maudlin, MD ?Debridement Type: Debridement ?Severity of Tissue Pre Debridement: Fat layer exposed ?Level of Consciousness (Pre-procedure): Awake and Alert ?Pre-procedure Verification/Time Out Yes - 11:24 ?Taken: ?Start Time: 11:24 ?Pain Control: Lidocaine 5% topical ointment ?T Area Debrided (L x W): ?otal 5.5 (cm) x 3 (cm) = 16.5 (cm?) ?Tissue and other material debrided: Non-Viable, Slough, Subcutaneous, [place] ?Level: Skin/Subcutaneous Tissue ?Debridement Description: Excisional ?Instrument: Curette ?Bleeding: Minimum ?Hemostasis Achieved: Pressure ?End Time: 11:26 ?Procedural Pain: 0 ?Post Procedural Pain: 0 ?Response to Treatment: Procedure was tolerated well ?Level of Consciousness (Post- Awake and Alert ?procedure): ?Post Debridement Measurements of Total Wound ?Length: (cm) 5.5 ?Width: (cm) 3 ?Depth: (cm) 0.1 ?Volume: (cm?) 1.296 ?Character of Wound/Ulcer Post Debridement: Improved ?Severity of Tissue Post Debridement: Fat layer exposed ?Post Procedure Diagnosis ?Same as Pre-procedure ?Electronic Signature(s) ?Signed: 08/30/2021  12:40:46 PM By: Dellie Catholic RN ?Signed: 08/30/2021 1:06:36 PM By: [name] Maudlin MD FACS ?Entered By: Dellie Catholic on 08/30/2021 11:31:11 ?-------------------------------------------------------------------------------- ?HPI Details ?Patient Name: ?Date of Service: ?[name], [place] [name] 08/30/2021 10:15 A M ?Medical Record Number: [id number] ?Patient Account Number: [id number] ?Date of Birth/Sex: ?Treating RN: ?11/02/1970 (51 y.o. F) [name], [name] Claude ?Primary Care Provider: Cammie [name] ?Other Clinician: ?Referring Provider: ?Treating Provider/Extender: [name] ?[name] ?Weeks in Treatment: 19 ?History of Present Illness ?Location: medial and lateral ankle region on the right and left medial malleolus ?Quality: Patient reports experiencing a shooting pain to affected area(s). ?Severity: Patient states wound(s) are getting worse. ?Duration: right lower extremity bimalleolar ulcers have been present for approximately 2 years; the rright meedial malleolus ulcer has been there proximally 6 ?months ?Timing: Pain in wound is constant (hurts all the time) ?Context: The wound would happen gradually ?ssociated Signs and Symptoms: Patient reports having increase discharge. 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 Leland Johns for a surgical debridement as I was unable to do anything in the office due to excruciating pain. At that stage she was ?referred from the plastic surgery service to dermatology who treated her for a skin infection with doxycycline and then Levaquin and a local antibiotic ?ointment. I understand the patient has since developed ulceration on the left ankle both medial and lateral and was now referred back to the wound center as ?dermatology has finished the management. 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 She has been visiting the wound

## 2021-09-06 ENCOUNTER — Encounter (HOSPITAL_BASED_OUTPATIENT_CLINIC_OR_DEPARTMENT_OTHER): Payer: Medicaid Other | Admitting: General Surgery

## 2021-09-06 DIAGNOSIS — L97818 Non-pressure chronic ulcer of other part of right lower leg with other specified severity: Secondary | ICD-10-CM | POA: Diagnosis not present

## 2021-09-06 NOTE — Progress Notes (Signed)
JAMILLAH, CAMILO ([id number]) ?Visit Report for 09/06/2021 ?Chief Complaint Document Details ?Patient Name: Date of Service: ?[name], [place] [name] 09/06/2021 12:30 PM ?Medical Record Number: [id number] ?Patient Account Number: [id number] ?Date of Birth/Sex: Treating RN: ?1970/11/30 (51 y.o. F) [name], [name] ?Primary Care Provider: [name] Other Clinician: ?Referring Provider: ?Treating Provider/Extender: Fredirick Maudlin ?[name] ?Weeks in Treatment: 20 ?Information Obtained from: Patient ?Chief Complaint ?the patient is here for evaluation of her bilateral lower extremity sickle cell ulcers ?04/17/2021; patient comes in for substantial wounds on the right and left lower leg ?Electronic Signature(s) ?Signed: 09/06/2021 1:48:00 PM By: Fredirick Maudlin MD FACS ?Entered By: Fredirick Maudlin on 09/06/2021 13:47:59 ?-------------------------------------------------------------------------------- ?Debridement Details ?Patient Name: Date of Service: ?[name], [place] [name] 09/06/2021 12:30 PM ?Medical Record Number: [id number] ?Patient Account Number: [id number] ?Date of Birth/Sex: Treating RN: ?March 05, 1971 (51 y.o. F) [name], [name] ?Primary Care Provider: [name] Other Clinician: ?Referring Provider: ?Treating Provider/Extender: Fredirick Maudlin ?[name] ?Weeks in Treatment: 20 ?Debridement Performed for Assessment: Wound #19 Left,Medial Ankle ?Performed By: Physician Fredirick Maudlin, MD ?Debridement Type: Debridement ?Level of Consciousness (Pre-procedure): Awake and Alert ?Pre-procedure Verification/Time Out Yes - 13:19 ?Taken: ?Start Time: 13:19 ?Pain Control: Lidocaine 5% topical ointment ?T Area Debrided (L x W): ?otal 1 (cm) x 0.4 (cm) = 0.4 (cm?) ?Tissue and other material debrided: Viable, Non-Viable, Slough, Subcutaneous, Slough ?Level: Skin/Subcutaneous Tissue ?Debridement Description: Excisional ?Instrument: Curette ?Bleeding: Minimum ?Hemostasis Achieved: Pressure ?End Time: 13:21 ?Procedural  Pain: 0 ?Post Procedural Pain: 0 ?Response to Treatment: Procedure was tolerated well ?Level of Consciousness (Post- Awake and Alert ?procedure): ?Post Debridement Measurements of Total Wound ?Length: (cm) 1 ?Width: (cm) 0.4 ?Depth: (cm) 0.1 ?Volume: (cm?) 0.031 ?Character of Wound/Ulcer Post Debridement: Improved ?Post Procedure Diagnosis ?Same as Pre-procedure ?Electronic Signature(s) ?Signed: 09/06/2021 3:56:53 PM By: Fredirick Maudlin MD FACS ?Signed: 09/06/2021 4:53:42 PM By: Dellie Catholic RN ?Entered By: Dellie Catholic on 09/06/2021 13:21:44 ?-------------------------------------------------------------------------------- ?Debridement Details ?Patient Name: ?Date of Service: ?[name], [place] [name] 09/06/2021 12:30 PM ?Medical Record Number: [id number] ?Patient Account Number: [id number] ?Date of Birth/Sex: ?Treating RN: ?May 25, 1970 (51 y.o. F) [name], [name] ?Primary Care Provider: [name] ?Other Clinician: ?Referring Provider: ?Treating Provider/Extender: Fredirick Maudlin ?[name] ?Weeks in Treatment: 20 ?Debridement Performed for Assessment: Wound #21 Right,Medial Ankle ?Performed By: Physician Fredirick Maudlin, MD ?Debridement Type: Debridement ?Severity of Tissue Pre Debridement: Fat layer exposed ?Level of Consciousness (Pre-procedure): Awake and Alert ?Pre-procedure Verification/Time Out Yes - 13:19 ?Taken: ?Start Time: 13:19 ?Pain Control: Lidocaine 5% topical ointment ?T Area Debrided (L x W): ?otal 5.5 (cm) x 3 (cm) = 16.5 (cm?) ?Tissue and other material debrided: Viable, Non-Viable, Slough, Subcutaneous, Slough ?Level: Skin/Subcutaneous Tissue ?Debridement Description: Excisional ?Instrument: Curette ?Bleeding: Minimum ?Hemostasis Achieved: Pressure ?End Time: 13:21 ?Procedural Pain: 0 ?Post Procedural Pain: 0 ?Response to Treatment: Procedure was tolerated well ?Level of Consciousness (Post- Awake and Alert ?procedure): ?Post Debridement Measurements of Total Wound ?Length: (cm)  5.5 ?Width: (cm) 3 ?Depth: (cm) 0.1 ?Volume: (cm?) 1.296 ?Character of Wound/Ulcer Post Debridement: Improved ?Severity of Tissue Post Debridement: Fat layer exposed ?Post Procedure Diagnosis ?Same as Pre-procedure ?Electronic Signature(s) ?Signed: 09/06/2021 3:56:53 PM By: Fredirick Maudlin MD FACS ?Signed: 09/06/2021 4:53:42 PM By: Dellie Catholic RN ?Entered By: Dellie Catholic on 09/06/2021 13:24:10 ?-------------------------------------------------------------------------------- ?Debridement Details ?Patient Name: ?Date of Service: ?[name], [place] [name] 09/06/2021 12:30 PM ?Medical Record Number: [id number] ?Patient Account Number: [id number] ?Date of Birth/Sex: ?Treating RN: ?Jul 28, 1970 (51 y.o. F) [name], [name] ?Primary Care Provider: [name] ?Other Clinician: ?  Referring Provider: ?Treating Provider/Extender: Fredirick Maudlin ?[name] ?Weeks in Treatment: 20 ?Debridement Performed for Assessment: Wound #17 Right,Lateral Lower Leg ?Performed By: Physician Fredirick Maudlin, MD ?Debridement Type: Debridement ?Level of Consciousness (Pre-procedure): Awake and Alert ?Pre-procedure Verification/Time Out Yes - 13:19 ?Taken: ?Start Time: 13:19 ?Pain Control: Lidocaine 5% topical ointment ?T Area Debrided (L x W): ?otal 7.7 (cm) x 4 (cm) = 30.8 (cm?) ?Tissue and other material debrided: Viable, Non-Viable, Slough, Subcutaneous, Slough ?Level: Skin/Subcutaneous Tissue ?Debridement Description: Excisional ?Instrument: Curette ?Bleeding: Minimum ?Hemostasis Achieved: Pressure ?End Time: 13:21 ?Procedural Pain: 0 ?Post Procedural Pain: 0 ?Response to Treatment: Procedure was tolerated well ?Level of Consciousness (Post- Awake and Alert ?procedure): ?Post Debridement Measurements of Total Wound ?Length: (cm) 7.7 ?Width: (cm) 4 ?Depth: (cm) 0.1 ?Volume: (cm?) 2.419 ?Character of Wound/Ulcer Post Debridement: Improved ?Post Procedure Diagnosis ?Same as Pre-procedure ?Electronic Signature(s) ?Signed: 09/06/2021  3:56:53 PM By: Fredirick Maudlin MD FACS ?Signed: 09/06/2021 4:53:42 PM By: Dellie Catholic RN ?Entered By: Dellie Catholic on 09/06/2021 13:58:16 ?-------------------------------------------------------------------------------- ?HPI Details ?Patient Name: ?Date of Service: ?[name], [place] [name] 09/06/2021 12:30 PM ?Medical Record Number: [id number] ?Patient Account Number: [id number] ?Date of Birth/Sex: ?Treating RN: ?1971/03/03 (51 y.o. F) [name], [name] ?Primary Care Provider: [name] ?Other Clinician: ?Referring Provider: ?Treating Provider/Extender: Fredirick Maudlin ?[name] ?Weeks in Treatment: 20 ?History of Present Illness ?Location: medial and lateral ankle region on the right and left medial malleolus ?Quality: Patient reports experiencing a shooting pain to affected area(s). ?Severity: Patient states wound(s) are getting worse. ?Duration: right lower extremity bimalleolar ulcers have been present for approximately 2 years; the rright meedial malleolus ulcer has been there proximally 6 ?months ?Timing: Pain in wound is constant (hurts all the time) ?Context: The wound would happen gradually ?ssociated Signs and Symptoms: Patient reports having increase discharge. ?A ?HPI Description: 51 year old patient with a history of sickle cell anemia who was last seen by me with ulceration of the right lower extremity above the ankle ?and was referred to Dr. Leland Johns for a surgical debridement as I was unable to do anything in the office due to excruciating pain. At that stage she was ?referred from the plastic surgery service to dermatology who treated her for a skin infection with doxycycline and then Levaquin and a local antibiotic ?ointment. I understand the patient has since developed ulceration on the left ankle both medial and lateral and was now referred back to the wound center as ?dermatology has finished the management. I do not have any notes from the dermatology department ?Old  notes: ?51 year old patient with a history of sickle cell anemia, pain bilateral lower extremities, right lower extremity ulcer and has a history of receiving a skin graft( ?[employer]) several months ago. She has been visiting the

## 2021-09-06 NOTE — Progress Notes (Signed)
[name], [name] ([id number]) ?Visit Report for 09/06/2021 ?Arrival Information Details ?Patient Name: Date of Service: ?[name], [place] [name] 09/06/2021 12:30 PM ?Medical Record Number: [id number] ?Patient Account Number: [id number] ?Date of Birth/Sex: Treating RN: ?[birth year] (51 y.o. F) [name], [name] ?Primary Care Anavey Coombes: [name] Other Clinician: ?Referring Alyssabeth Bruster: ?Treating Cleta Heatley/Extender: Fredirick Maudlin ?[name] ?Weeks in Treatment: 20 ?Visit Information History Since Last Visit ?Added or deleted any medications: No ?Patient Arrived: Wheel Chair ?Any new allergies or adverse reactions: No ?Arrival Time: 12:41 ?Had a fall or experienced change in No ?Accompanied By: self ?activities of daily living that may affect ?Transfer Assistance: None ?risk of falls: ?Patient Identification Verified: Yes ?Signs or symptoms of abuse/neglect since last visito No ?Secondary Verification Process Completed: Yes ?Hospitalized since last visit: No ?Patient Requires Transmission-Based Precautions: No ?Implantable device outside of the clinic excluding No ?Patient Has Alerts: No ?cellular tissue based products placed in the center ?since last visit: ?Has Dressing in Place as Prescribed: Yes ?Has Compression in Place as Prescribed: Yes ?Pain Present Now: No ?Electronic Signature(s) ?Signed: 09/06/2021 4:53:42 PM By: Dellie Catholic RN ?Entered By: Dellie Catholic on 09/06/2021 12:44:52 ?-------------------------------------------------------------------------------- ?Encounter Discharge Information Details ?Patient Name: Date of Service: ?[name], [place] [name] 09/06/2021 12:30 PM ?Medical Record Number: [id number] ?Patient Account Number: [id number] ?Date of Birth/Sex: Treating RN: ?Jun 17, 1970 (51 y.o. F) [name], [name] ?Primary Care Jeanie Mccard: [name] Other Clinician: ?Referring Ardyce Heyer: ?Treating Kellan Boehlke/Extender: Fredirick Maudlin ?[name] ?Weeks in Treatment: 20 ?Encounter Discharge Information Items  Post Procedure Vitals ?Discharge Condition: Stable ?Temperature (F): 98.2 ?Ambulatory Status: Wheelchair ?Pulse (bpm): 69 ?Discharge Destination: Home ?Respiratory Rate (breaths/min): 16 ?Transportation: Other ?Blood Pressure (mmHg): 132/72 ?Accompanied By: self ?Schedule Follow-up Appointment: Yes ?Clinical Summary of Care: Patient Declined ?Electronic Signature(s) ?Signed: 09/06/2021 4:53:42 PM By: Dellie Catholic RN ?Entered By: Dellie Catholic on 09/06/2021 16:49:05 ?-------------------------------------------------------------------------------- ?Lower Extremity Assessment Details ?Patient Name: ?Date of Service: ?[name], [place] [name] 09/06/2021 12:30 PM ?Medical Record Number: [id number] ?Patient Account Number: [id number] ?Date of Birth/Sex: ?Treating RN: ?21-Sep-1970 (51 y.o. F) [name], [name] ?Primary Care Yacob Wilkerson: [name] ?Other Clinician: ?Referring Zamorah Ailes: ?Treating Neda Willenbring/Extender: Fredirick Maudlin ?[name] ?Weeks in Treatment: 20 ?Edema Assessment ?Assessed: [Left: No] [Right: No] ?Edema: [Left: No] [Right: No] ?Calf ?Left: Right: ?Point of Measurement: 33 cm From Medial Instep 25.6 cm 28.4 cm ?Ankle ?Left: Right: ?Point of Measurement: 10 cm From Medial Instep 16 cm 19 cm ?Electronic Signature(s) ?Signed: 09/06/2021 4:53:42 PM By: Dellie Catholic RN ?Entered By: Dellie Catholic on 09/06/2021 13:02:41 ?-------------------------------------------------------------------------------- ?Multi Wound Chart Details ?Patient Name: ?Date of Service: ?[name], [place] [name] 09/06/2021 12:30 PM ?Medical Record Number: [id number] ?Patient Account Number: [id number] ?Date of Birth/Sex: ?Treating RN: ?November 28, 1970 (51 y.o. F) [name], [name] ?Primary Care Lashone Stauber: [name] ?Other Clinician: ?Referring Jobanny Mavis: ?Treating Takerra Lupinacci/Extender: Fredirick Maudlin ?[name] ?Weeks in Treatment: 20 ?Vital Signs ?Height(in): 67 ?Pulse(bpm): 69 ?Weight(lbs): ?Blood Pressure(mmHg): 132/72 ?Body Mass  Index(BMI): ?Temperature(??F): 98.2 ?Respiratory Rate(breaths/min): 16 ?Photos: ?Right, Lateral Lower Leg Left, Medial Ankle Right, Medial Ankle ?Wound Location: ?Gradually Appeared Gradually Appeared Gradually Appeared ?Wounding Event: ?Sickle Cell Lesion Sickle Cell Lesion Sickle Cell Lesion ?Primary Etiology: ?N/A N/A Venous Leg Ulcer ?Secondary Etiology: ?Anemia, Sickle Cell Disease, Anemia, Sickle Cell Disease, Anemia, Sickle Cell Disease, ?Comorbid History: ?Neuropathy Neuropathy Neuropathy ?10/05/2012 12/25/2020 06/26/2021 ?Date Acquired: ?'20 20 10 '$ ?Weeks of Treatment: ?Open Open Open ?Wound Status: ?No No No ?Wound Recurrence: ?Yes No No ?Clustered Wound: ?7.7x4x0.1 1x0.4x0.1 5.5x3x0.1 ?Measurements L x W x D (cm) ?24.19 0.314  12.959 ?A (cm?) : ?rea ?2.419 0.031 1.296 ?Volume (cm?) : ?87.10% 98.50% 55.30% ?% Reduction in Area: ?87.10% 99.80% 55.30% ?% Reduction in Volume: ?Full Thickness Without Exposed Full Thickness Without Exposed Full Thickness Without Exposed ?Classification: ?Support Structures Support Structures Support Structures ?Medium Medium Medium ?Exudate A mount: ?Purulent Serosanguineous Purulent ?Exudate Type: ?yellow, brown, green red, brown yellow, brown, green ?Exudate Color: ?Flat and Intact Flat and Intact Distinct, outline attached ?Wound Margin: ?Small (1-33%) Medium (34-66%) Medium (34-66%) ?Granulation A mount: ?Pink Pink Pink ?Granulation Quality: ?Large (67-100%) Medium (34-66%) Medium (34-66%) ?Necrotic A mount: ?Fat Layer (Subcutaneous Tissue): Yes Fat Layer (Subcutaneous Tissue): Yes Fat Layer (Subcutaneous Tissue): Yes ?Exposed Structures: ?Fascia: No ?Fascia: No ?Fascia: No ?Tendon: No ?Tendon: No ?Tendon: No ?Muscle: No ?Muscle: No ?Muscle: No ?Joint: No ?Joint: No ?Joint: No ?Bone: No ?Bone: No ?Bone: No ?Small (1-33%) Medium (34-66%) Medium (34-66%) ?Epithelialization: ?Debridement - Excisional Debridement - Excisional Debridement - Excisional ?Debridement: ?Pre-procedure  Verification/Time Out 13:19 13:19 13:19 ?Taken: ?Lidocaine 5% topical ointment Lidocaine 5% topical ointment Lidocaine 5% topical ointment ?Pain Control: ?Subcutaneous, Slough Subcutaneous, [employer], [place] ?Tissue Debrided: ?Skin/Subcutaneous Tissue Skin/Subcutaneous Tissue Skin/Subcutaneous Tissue ?Level: ?30.8 0.4 16.5 ?Debridement A (sq cm): ?rea ?Curette Curette Curette ?Instrument: ?Minimum Minimum Minimum ?Bleeding: ?Pressure Pressure Pressure ?Hemostasis A chieved: ?0 0 0 ?Procedural Pain: ?0 0 0 ?Post Procedural Pain: ?Procedure was tolerated well Procedure was tolerated well Procedure was tolerated well ?Debridement Treatment Response: ?7.7x4x0.1 1x0.4x0.1 5.5x3x0.1 ?Post Debridement Measurements L x ?W x D (cm) ?2.419 0.031 1.296 ?Post Debridement Volume: (cm?) ?Debridement Debridement Debridement ?Procedures Performed: ?Treatment Notes ?Electronic Signature(s) ?Signed: 09/06/2021 1:47:20 PM By: Fredirick Maudlin MD FACS ?Signed: 09/06/2021 4:53:42 PM By: Dellie Catholic RN ?Entered By: Fredirick Maudlin on 09/06/2021 13:47:20 ?-------------------------------------------------------------------------------- ?Multi-Disciplinary Care Plan Details ?Patient Name: ?Date of Service: ?[name], [place] [name] 09/06/2021 12:30 PM ?Medical Record Number: [id number] ?Patient Account Number: [id number] ?Date of Birth/Sex: ?Treating RN: ?01-09-1971 (51 y.o. 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 ?Compression as ordered ?Treatment Activities: ?Therapeutic compression applied : 06/26/2021 ?Notes: ?Wound/Skin Impairment ?Nursing Diagnoses: ?Impaired tissue integrity ?Goals: ?P

## 2021-09-11 ENCOUNTER — Ambulatory Visit (INDEPENDENT_AMBULATORY_CARE_PROVIDER_SITE_OTHER): Payer: Medicaid Other | Admitting: Family Medicine

## 2021-09-11 ENCOUNTER — Telehealth: Payer: Self-pay

## 2021-09-11 ENCOUNTER — Encounter (HOSPITAL_COMMUNITY): Payer: Self-pay | Admitting: Family Medicine

## 2021-09-11 ENCOUNTER — Encounter: Payer: Self-pay | Admitting: Family Medicine

## 2021-09-11 ENCOUNTER — Non-Acute Institutional Stay (HOSPITAL_COMMUNITY)
Admission: AD | Admit: 2021-09-11 | Discharge: 2021-09-11 | Disposition: A | Discharge status: Home / Self Care | Payer: Medicaid Other | Source: Ambulatory Visit | Attending: Internal Medicine | Admitting: Internal Medicine

## 2021-09-11 VITALS — BP 140/65 | HR 81 | Temp 98.4°F | Ht 67.0 in | Wt 134.2 lb

## 2021-09-11 DIAGNOSIS — G894 Chronic pain syndrome: Secondary | ICD-10-CM

## 2021-09-11 DIAGNOSIS — L97929 Non-pressure chronic ulcer of unspecified part of left lower leg with unspecified severity: Secondary | ICD-10-CM | POA: Diagnosis not present

## 2021-09-11 DIAGNOSIS — D5700 Hb-SS disease with crisis, unspecified: Secondary | ICD-10-CM | POA: Diagnosis present

## 2021-09-11 DIAGNOSIS — D571 Sickle-cell disease without crisis: Secondary | ICD-10-CM | POA: Diagnosis not present

## 2021-09-11 DIAGNOSIS — E559 Vitamin D deficiency, unspecified: Secondary | ICD-10-CM | POA: Diagnosis not present

## 2021-09-11 DIAGNOSIS — L97919 Non-pressure chronic ulcer of unspecified part of right lower leg with unspecified severity: Secondary | ICD-10-CM | POA: Diagnosis not present

## 2021-09-11 DIAGNOSIS — D638 Anemia in other chronic diseases classified elsewhere: Secondary | ICD-10-CM | POA: Diagnosis present

## 2021-09-11 DIAGNOSIS — L97312 Non-pressure chronic ulcer of right ankle with fat layer exposed: Secondary | ICD-10-CM | POA: Diagnosis not present

## 2021-09-11 LAB — CBC
HCT: 23.5 % — ABNORMAL LOW (ref 36.0–46.0)
Hemoglobin: 8.3 g/dL — ABNORMAL LOW (ref 12.0–15.0)
MCH: 31 pg (ref 26.0–34.0)
MCHC: 35.3 g/dL (ref 30.0–36.0)
MCV: 87.7 fL (ref 80.0–100.0)
Platelets: 347 10*3/uL (ref 150–400)
RBC: 2.68 MIL/uL — ABNORMAL LOW (ref 3.87–5.11)
RDW: 24.9 % — ABNORMAL HIGH (ref 11.5–15.5)
WBC: 12.4 10*3/uL — ABNORMAL HIGH (ref 4.0–10.5)
nRBC: 27.1 % — ABNORMAL HIGH (ref 0.0–0.2)

## 2021-09-11 LAB — CBC WITH DIFFERENTIAL/PLATELET
Abs Immature Granulocytes: 0.11 10*3/uL — ABNORMAL HIGH (ref 0.00–0.07)
Basophils Absolute: 0.2 10*3/uL — ABNORMAL HIGH (ref 0.0–0.1)
Basophils Relative: 1 %
Eosinophils Absolute: 0.3 10*3/uL (ref 0.0–0.5)
Eosinophils Relative: 3 %
HCT: 18.4 % — ABNORMAL LOW (ref 36.0–46.0)
Hemoglobin: 6.5 g/dL — CL (ref 12.0–15.0)
Immature Granulocytes: 1 %
Lymphocytes Relative: 13 %
Lymphs Abs: 1.6 10*3/uL (ref 0.7–4.0)
MCH: 32.2 pg (ref 26.0–34.0)
MCHC: 35.3 g/dL (ref 30.0–36.0)
MCV: 91.1 fL (ref 80.0–100.0)
Monocytes Absolute: 1.9 10*3/uL — ABNORMAL HIGH (ref 0.1–1.0)
Monocytes Relative: 15 %
Neutro Abs: 8.5 10*3/uL — ABNORMAL HIGH (ref 1.7–7.7)
Neutrophils Relative %: 67 %
Platelets: 355 10*3/uL (ref 150–400)
RBC: 2.02 MIL/uL — ABNORMAL LOW (ref 3.87–5.11)
RDW: 28 % — ABNORMAL HIGH (ref 11.5–15.5)
WBC: 12.7 10*3/uL — ABNORMAL HIGH (ref 4.0–10.5)
nRBC: 31.5 % — ABNORMAL HIGH (ref 0.0–0.2)

## 2021-09-11 LAB — COMPREHENSIVE METABOLIC PANEL
ALT: 22 U/L (ref 0–44)
AST: 44 U/L — ABNORMAL HIGH (ref 15–41)
Albumin: 4.3 g/dL (ref 3.5–5.0)
Alkaline Phosphatase: 51 U/L (ref 38–126)
Anion gap: 7 (ref 5–15)
BUN: 7 mg/dL (ref 6–20)
CO2: 24 mmol/L (ref 22–32)
Calcium: 9.2 mg/dL (ref 8.9–10.3)
Chloride: 107 mmol/L (ref 98–111)
Creatinine, Ser: 0.35 mg/dL — ABNORMAL LOW (ref 0.44–1.00)
GFR, Estimated: >60 mL/min (ref >60)
Glucose, Bld: 85 mg/dL (ref 70–99)
Potassium: 3.6 mmol/L (ref 3.5–5.1)
Sodium: 138 mmol/L (ref 135–145)
Total Bilirubin: 3.9 mg/dL — ABNORMAL HIGH (ref 0.3–1.2)
Total Protein: 8 g/dL (ref 6.5–8.1)

## 2021-09-11 LAB — RETICULOCYTES
Immature Retic Fract: 47.2 % — ABNORMAL HIGH (ref 2.3–15.9)
RBC.: 2.02 MIL/uL — ABNORMAL LOW (ref 3.87–5.11)
Retic Count, Absolute: 532 10*3/uL — ABNORMAL HIGH (ref 19.0–186.0)
Retic Ct Pct: 25.3 % — ABNORMAL HIGH (ref 0.4–3.1)

## 2021-09-11 LAB — LACTATE DEHYDROGENASE: LDH: 500 U/L — ABNORMAL HIGH (ref 98–192)

## 2021-09-11 LAB — PREPARE RBC (CROSSMATCH)

## 2021-09-11 MED ORDER — ACETAMINOPHEN 500 MG PO TABS
1000.0000 mg | ORAL_TABLET | Freq: Once | ORAL | Status: AC
  Administered 2021-09-11: 1000 mg via ORAL
  Filled 2021-09-11: qty 2

## 2021-09-11 MED ORDER — DIPHENHYDRAMINE HCL 25 MG PO CAPS: 25.0000 mg | ORAL_CAPSULE | Freq: Once | ORAL | Status: AC

## 2021-09-11 MED ORDER — SODIUM CHLORIDE 0.9% FLUSH: 9.0000 mL | INTRAVENOUS | Status: DC | PRN

## 2021-09-11 MED ORDER — OXYCODONE HCL 10 MG PO TABS: 10.0000 mg | ORAL_TABLET | ORAL | 0 refills | Status: DC

## 2021-09-11 MED ORDER — NALOXONE HCL 0.4 MG/ML IJ SOLN
0.4000 mg | INTRAMUSCULAR | Status: DC | PRN
Start: 2021-09-11 — End: 2021-09-11

## 2021-09-11 MED ORDER — KETOROLAC TROMETHAMINE 30 MG/ML IJ SOLN
15.0000 mg | Freq: Once | INTRAMUSCULAR | Status: AC
Start: 2021-09-11 — End: 2021-09-11
  Administered 2021-09-11: 15 mg via INTRAVENOUS
  Filled 2021-09-11: qty 1

## 2021-09-11 MED ORDER — HYDROMORPHONE 1 MG/ML IV SOLN
INTRAVENOUS | Status: DC
  Administered 2021-09-11: 10.5 mg via INTRAVENOUS
  Filled 2021-09-11: qty 30

## 2021-09-11 MED ORDER — SODIUM CHLORIDE 0.45 % IV SOLN: INTRAVENOUS | Status: DC

## 2021-09-11 MED ORDER — DIPHENHYDRAMINE HCL 25 MG PO CAPS
25.0000 mg | ORAL_CAPSULE | ORAL | Status: DC | PRN
  Administered 2021-09-11: 25 mg via ORAL
  Filled 2021-09-11: qty 1

## 2021-09-11 MED ORDER — SODIUM CHLORIDE 0.9% IV SOLUTION: Freq: Once | INTRAVENOUS | Status: AC

## 2021-09-11 MED ORDER — ONDANSETRON HCL 4 MG/2ML IJ SOLN: 4.0000 mg | Freq: Four times a day (QID) | INTRAMUSCULAR | Status: DC | PRN

## 2021-09-11 NOTE — Progress Notes (Signed)
Patient admitted to the day infusion hospital for sickle cell pain management and blood transfusion. Initially, patient reported bilateral arm and left hip pain rated 9/10. Patient's labs drawn and Hemoglobin 6.5. For pain management, patient placed on Dilaudid PCA, given Toradol, Tylenol and hydrated with IV fluids. For anemia, patient given 1 unit PRBC's. Patient tolerated transfusion well. At discharge, patient rated pain at 2/10. Vital signs stable. Post transfusion CBC drawn. Discharge instructions given. Patient alert, oriented and transfers in wheelchair at discharge.  ?

## 2021-09-11 NOTE — Progress Notes (Signed)
?[name] ?[facility]  ? ?Established [name] Office Visit ? ?Subjective   ?[name] ID: [name], female    DOB: 09/16/1970  Age: 51 y.o. MRN: [id number] ? ?Chief Complaint  ?[name] presents with  ? Follow-up  ?  Pt is here for 1 month follow up.pt stated she has pain in both arms for the past 5 days. Pt is requesting a refill for oxycodone   ? ? ?HPI ? [name] is a 51 year old female with a medical history significant for sickle cell disease, chronic pain syndrome, opiate dependence and tolerance, anemia of chronic disease, and bilateral lower extremity ulcers presents for a follow up of sickle cell [name] also has a history of sickle cell disease along with chronic pain syndrome.  Pain is moderately controlled on oxycodone and ibuprofen.  [name] has bilateral lower extremity ulcers secondary to uncontrolled sickle cell disease.  She is followed by wound care specialist weekly for this problem.  She says that she has an upcoming appointment with plastic surgeon concerning ulcers. ?[name] is complaining of pain to upper and lower extremities. Pain intensity is 8/10 described as constant and throbbing. Pain has been unrelieved by her home opiate medications and Ibuprofen that were last take this am.  ? ?Today, [name] denies any headache, chest pain, urinary symptoms, nausea, vomiting, or diarrhea.  ?  ?[name] Active Problem List  ? Diagnosis Date Noted  ? Protein-calorie malnutrition, severe 04/06/2021  ? Bilateral leg ulcer ([place]) 03/28/2021  ? Low hemoglobin 05/01/2020  ? At risk for seizures due to oxygen toxicity 02/09/2020  ? 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 Venous stasis 01/19/2020  ? Partial loss of skin graft 01/12/2020  ? Acute respiratory failure with hypoxia ([place]) 12/15/2019  ? 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 Sickle cell anemia with crisis ([place]) 09/03/2018  ? Other chronic pain   ? Sickle cell crisis ([place]) 06/26/2018  ?  Sickle cell pain crisis ([place]) 04/07/2018  ? Avascular necrosis of bone of hip, left ([place]) 12/08/2017  ? Avascular necrosis of bone of hip, right ([place]) 12/08/2017  ? Hb-SS disease without crisis ([place]) 11/26/2017  ? Anemia of chronic disease 11/26/2017  ? Abnormal quad screen   ? Ringworm of body 04/05/2017  ? Ulcer of right lower extremity ([place])   ? 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 History of pre-eclampsia 12/16/2016  ? Previous cesarean delivery x 2 07/22/2013  ? Hemochromatosis 11/10/2012  ? Chronic pain syndrome 10/13/2012  ? Complicated wound infection 04/01/2012  ? Leukocytosis 03/30/2012  ? ?Past Medical History:  ?Diagnosis Date  ? Anemia 03/30/2012  ? Hx of sickle cell disease  ? CAP (community acquired pneumonia)   ? 2014  ? Cholelithiasis 03/30/2012  ? Chronic wound of extremity 04/01/2012  ? Elevated LFTs   ? Leg ulcer ([place]) 10/27/2012  ? Chronic under care of wound clinic  ? Multiple open wounds of lower extremity   ? chronic wounds B/LLE  ? Sickle cell disease ([place])   ? Sinus bradycardia by electrocardiogram 04/03/2012  ? ?Past Surgical History:  ?Procedure Laterality Date  ? ALLOGRAFT APPLICATION Right 3/66/2947  ? Procedure: SURGICAL PREP FOR GRAFTING RIGHT LOWER EXTREMITY AND APPLICATION OF Jannifer Hick;  Surgeon: Irene Limbo, MD;  Location: [facility];  Service: Plastics;  Laterality: Right;  ? APPLICATION OF A-CELL OF EXTREMITY Right 10/09/2015  ? Procedure: APPLICATION OF Jannifer Hick;  Surgeon: Irene Limbo, MD;  Location: [facility];  Service: Plastics;  Laterality: Right;  ? BREAST SURGERY    ? left breast cyst aspiration  ? CESAREAN SECTION    ? CESAREAN SECTION N/A 01/06/2014  ? Procedure: CESAREAN SECTION;  Surgeon: Emily Filbert, MD;  Location: [facility];  Service: Obstetrics;  Laterality: N/A;  ? CESAREAN SECTION N/A 05/04/2017  ? Procedure: CESAREAN SECTION;  Surgeon: Woodroe Mode, MD;  Location: [name];  Service:  Obstetrics;  Laterality: N/A;  ? I & D EXTREMITY Right 10/09/2015  ? Procedure: SURGICAL PREPARATION FOR GRAFTING RIGHT ANKLE AND APPLICATION THERASKIN;  Surgeon: Irene Limbo, MD;  Location: [facility];  Service: Plastics;  Laterality: Right;  ? SKIN FULL THICKNESS GRAFT Bilateral 06/17/2016  ? Procedure: SURGICAL PREP FOR GRAFTING, BILATERAL LOWER EXTREMITIES AND APPLICATION OF Jannifer Hick;  Surgeon: Irene Limbo, MD;  Location: [facility];  Service: Plastics;  Laterality: Bilateral;  ? TONSILLECTOMY    ? ?Social History  ? ?Tobacco Use  ? Smoking status: Never  ?  Passive exposure: Never  ? Smokeless tobacco: Never  ?Vaping Use  ? Vaping Use: Never used  ?Substance Use Topics  ? Alcohol use: No  ? Drug use: No  ? ?Social History  ? ?Socioeconomic History  ? Marital status: Married  ?  Spouse name: [name]  ? Number of children: 1  ? Years of education: Not on file  ? Highest education level: Not on file  ?Occupational History  ? Occupation: Employed in home care.  ?Tobacco Use  ? Smoking status: Never  ?  Passive exposure: Never  ? Smokeless tobacco: Never  ?Vaping Use  ? Vaping Use: Never used  ?Substance and Sexual Activity  ? Alcohol use: No  ? Drug use: No  ? Sexual activity: Yes  ?  Birth control/protection: None  ?Other Topics Concern  ? Not on file  ?Social History Narrative  ? Lives with husband.  ? ?Social Determinants of Health  ? ?Financial Resource Strain: Not on file  ?Food Insecurity: Not on file  ?Transportation Needs: Not on file  ?Physical Activity: Not on file  ?Stress: Not on file  ?Social Connections: Not on file  ?Intimate Partner Violence: Not on file  ? ?Family Status  ?Relation Name Status  ? Sister  (Not Specified)  ? Mother  Alive  ? Father  Deceased  ? ?Family History  ?Problem Relation Age of Onset  ? Sickle cell anemia Sister   ? Diabetes Mother   ? Asthma Mother   ? Sickle cell trait Mother   ? Sickle cell trait Father   ? Hypertension Father   ? ?No Known  Allergies ?  ? ?Review of Systems  ?Constitutional: Negative.   ?HENT: Negative.    ?Eyes: Negative.   ?Respiratory: Negative.    ?Cardiovascular: Negative.   ?Gastrointestinal: Negative.   ?Genitourinary: Negative.   ?Musculoskeletal:  Positive for back pain and joint pain.  ?Skin: Negative.   ?Neurological: Negative.   ?Psychiatric/Behavioral: Negative.    ? ?  ?Objective:  ?  ? ?BP 140/65 (BP Location: Left Arm, [name] Position: Sitting, Cuff Size: Normal)   Pulse 81   Temp 98.4 ?F (36.9 ?C)   Ht '5\' 7"'$  (1.702 m)   Wt 134 lb 3.2 oz (60.9 kg)   SpO2 100%   BMI 21.02 kg/m?  ?BP Readings from Last 3 Encounters:  ?09/11/21 132/76  ?09/11/21 140/65  ?08/16/21 130/80  ? ?Wt Readings from Last 3  Encounters:  ?09/11/21 134 lb 3.2 oz (60.9 kg)  ?08/16/21 132 lb (59.9 kg)  ?07/25/21 131 lb 12.8 oz (59.8 kg)  ? ?  ? ?Physical Exam ?Constitutional:   ?   Appearance: Normal appearance.  ?Eyes:  ?   Pupils: Pupils are equal, round, and reactive to light.  ?Cardiovascular:  ?   Rate and Rhythm: Normal rate and regular rhythm.  ?   Pulses: Normal pulses.  ?   Heart sounds: Normal heart sounds.  ?Pulmonary:  ?   Effort: Pulmonary effort is normal.  ?Abdominal:  ?   General: Abdomen is flat.  ?Neurological:  ?   General: No focal deficit present.  ?   Mental Status: She is alert. Mental status is at baseline.  ?Psychiatric:     ?   Mood and Affect: Mood normal.     ?   Behavior: Behavior normal.     ?   Thought Content: Thought content normal.     ?   Judgment: Judgment normal.  ? ? ? ?No results found for any visits on 09/11/21. ? ?Last CBC ?Lab Results  ?Component Value Date  ? WBC 11.4 (H) 08/16/2021  ? HGB 7.9 (L) 08/16/2021  ? HCT 21.5 (L) 08/16/2021  ? MCV 85 08/16/2021  ? MCH 31.3 08/16/2021  ? RDW 20.4 (H) 08/16/2021  ? PLT 491 (H) 08/16/2021  ? ?Last metabolic panel ?Lab Results  ?Component Value Date  ? GLUCOSE 85 09/11/2021  ? NA 138 09/11/2021  ? K 3.6 09/11/2021  ? CL 107 09/11/2021  ? CO2 24 09/11/2021  ? BUN 7  09/11/2021  ? CREATININE 0.35 (L) 09/11/2021  ? GFRNONAA >60 09/11/2021  ? CALCIUM 9.2 09/11/2021  ? PROT 8.0 09/11/2021  ? ALBUMIN 4.3 09/11/2021  ? LABGLOB 2.9 08/16/2021  ? AGRATIO 1.6 08/16/2021  ? BILITOT 3.9

## 2021-09-11 NOTE — H&P (Incomplete)
?[facility] History and Physical  ? ?Date: 09/11/2021 ? ?Patient name: [name] Medical record number: [id number] ?Date of birth: 1970-07-11 Age: 51 y.o. Gender: female ?PCP: [name], FNP ? ?Attending physician: Tresa Garter, MD ? ?Chief Complaint: Sickle cell pain crisis ? ?History of Present Illness: ?[name] is a 51 year old female with a medical history significant for  ? ?Meds: ?Medications Prior to Admission  ?Medication Sig Dispense Refill Last Dose  ? aspirin EC 81 MG tablet Take 1 tablet (81 mg total) by mouth daily. 30 tablet 11   ? Deferiprone 500 MG TABS Take 500 mg by mouth daily at 12 noon. (Patient not taking: Reported on 07/25/2021) 90 tablet 2   ? feeding supplement (ENSURE ENLIVE / ENSURE PLUS) LIQD Take 237 mLs by mouth 2 (two) times daily between meals. (Patient not taking: Reported on 09/11/2021) 95638 mL 12   ? folic acid (FOLVITE) 1 MG tablet Take 5 tablets (5 mg total) by mouth daily. (Patient taking differently: Take 1 mg by mouth daily.) 150 tablet 11   ? gabapentin (NEURONTIN) 300 MG capsule TAKE 1 CAPSULE(300 MG) BY MOUTH THREE TIMES DAILY 90 capsule 2   ? ibuprofen (ADVIL) 600 MG tablet Take 1 tablet (600 mg total) by mouth every 6 (six) hours as needed for mild pain. 30 tablet 3   ? Nutritional Supplements (JUVEN) POWD Take 1 each by mouth 3 (three) times daily between meals. (Patient not taking: Reported on 09/11/2021) 545 g 6   ? Oxycodone HCl 10 MG TABS Take 1 tablet (10 mg total) by mouth every 4 (four) hours while awake. 90 tablet 0   ? voxelotor (OXBRYTA) 500 MG TABS tablet Take 1,000 mg by mouth daily. (Patient not taking: Reported on 08/16/2021) 90 tablet 1   ? ? ?Allergies: ?Patient has no known allergies. ?Past Medical History:  ?Diagnosis Date  ? Anemia 03/30/2012  ? Hx of sickle cell disease  ? CAP (community acquired pneumonia)   ? 2014  ? Cholelithiasis 03/30/2012  ? Chronic wound of extremity 04/01/2012  ? Elevated LFTs   ? Leg  ulcer ([place]) 10/27/2012  ? Chronic under care of wound clinic  ? Multiple open wounds of lower extremity   ? chronic wounds B/LLE  ? Sickle cell disease ([place])   ? Sinus bradycardia by electrocardiogram 04/03/2012  ? ?Past Surgical History:  ?Procedure Laterality Date  ? ALLOGRAFT APPLICATION Right 7/56/4332  ? Procedure: SURGICAL PREP FOR GRAFTING RIGHT LOWER EXTREMITY AND APPLICATION OF Jannifer Hick;  Surgeon: Irene Limbo, MD;  Location: [facility];  Service: Plastics;  Laterality: Right;  ? APPLICATION OF A-CELL OF EXTREMITY Right 10/09/2015  ? Procedure: APPLICATION OF Jannifer Hick;  Surgeon: Irene Limbo, MD;  Location: [facility];  Service: Plastics;  Laterality: Right;  ? BREAST SURGERY    ? left breast cyst aspiration  ? CESAREAN SECTION    ? CESAREAN SECTION N/A 01/06/2014  ? Procedure: CESAREAN SECTION;  Surgeon: Emily Filbert, MD;  Location: [facility];  Service: Obstetrics;  Laterality: N/A;  ? CESAREAN SECTION N/A 05/04/2017  ? Procedure: CESAREAN SECTION;  Surgeon: Woodroe Mode, MD;  Location: [facility];  Service: Obstetrics;  Laterality: N/A;  ? I & D EXTREMITY Right 10/09/2015  ? Procedure: SURGICAL PREPARATION FOR GRAFTING RIGHT ANKLE AND APPLICATION THERASKIN;  Surgeon: Irene Limbo, MD;  Location: [facility];  Service: Plastics;  Laterality: Right;  ? SKIN FULL THICKNESS GRAFT  Bilateral 06/17/2016  ? Procedure: SURGICAL PREP FOR GRAFTING, BILATERAL LOWER EXTREMITIES AND APPLICATION OF Jannifer Hick;  Surgeon: Irene Limbo, MD;  Location: [facility];  Service: Plastics;  Laterality: Bilateral;  ? TONSILLECTOMY    ? ?Family History  ?Problem Relation Age of Onset  ? Sickle cell anemia Sister   ? Diabetes Mother   ? Asthma Mother   ? Sickle cell trait Mother   ? Sickle cell trait Father   ? Hypertension Father   ? ?Social History  ? ?Socioeconomic History  ? Marital status: Married  ?  Spouse name: [name]  ? Number of children: 1  ? Years of  education: Not on file  ? Highest education level: Not on file  ?Occupational History  ? Occupation: Employed in home care.  ?Tobacco Use  ? Smoking status: Never  ?  Passive exposure: Never  ? Smokeless tobacco: Never  ?Vaping Use  ? Vaping Use: Never used  ?Substance and Sexual Activity  ? Alcohol use: No  ? Drug use: No  ? Sexual activity: Yes  ?  Birth control/protection: None  ?Other Topics Concern  ? Not on file  ?Social History Narrative  ? Lives with husband.  ? ?Social Determinants of Health  ? ?Financial Resource Strain: Not on file  ?Food Insecurity: Not on file  ?Transportation Needs: Not on file  ?Physical Activity: Not on file  ?Stress: Not on file  ?Social Connections: Not on file  ?Intimate Partner Violence: Not on file  ? ?Review of Systems  ?Constitutional:  Negative for chills and fever.  ?HENT: Negative.    ?Respiratory: Negative.    ?Cardiovascular: Negative.   ?Gastrointestinal: Negative.   ?Genitourinary: Negative.   ?Musculoskeletal:  Positive for back pain and joint pain.  ?Skin: Negative.   ?Neurological: Negative.   ?Psychiatric/Behavioral: Negative.    ? ?Physical Exam: ?unknown if currently breastfeeding. ?Physical Exam ?Constitutional:   ?   Appearance: Normal appearance.  ?Eyes:  ?   Pupils: Pupils are equal, round, and reactive to light.  ?Cardiovascular:  ?   Rate and Rhythm: Normal rate and regular rhythm.  ?   Pulses: Normal pulses.  ?Pulmonary:  ?   Effort: Pulmonary effort is normal.  ?Abdominal:  ?   General: Bowel sounds are normal.  ?Skin: ?   General: Skin is warm.  ?Neurological:  ?   General: No focal deficit present.  ?   Mental Status: She is alert. Mental status is at baseline.  ?Psychiatric:     ?   Mood and Affect: Mood normal.     ?   Behavior: Behavior normal.     ?   Thought Content: Thought content normal.     ?   Judgment: Judgment normal.  ? ? ? ?Lab results: ?No results found for this or any previous visit (from the past 24 hour(s)). ? ?Imaging results:  ?No  results found. ? ? ?Assessment & Plan: ?Patient admitted to sickle cell day infusion center for management of pain crisis.  ?Patient is opiate tolerant ?Initiate IV dilaudid PCA.  ?IV fluids, 0.45% saline at 100 ml/hr ?Toradol 15 mg IV times one dose ?Tylenol 1000 mg by mouth times one dose ?Review CBC with differential, complete metabolic  ?Pain intensity will be reevaluated in context of functioning and relationship to baseline as care progresses ?If pain intensity remains elevated and/or sudden change in hemodynamic stability transition to inpatient services for higher level of care.  ? ?  ? ? ? ?  [name]  APRN, MSN, FNP-C ?[facility] [place] ?[facility] ?[address]  ?[place], [place] [address] ?[phone] ? ?09/11/2021, 9:35 AM ? ?

## 2021-09-12 LAB — TYPE AND SCREEN
ABO/RH(D): B POS
Antibody Screen: NEGATIVE
Unit division: 0

## 2021-09-12 LAB — BPAM RBC
Blood Product Expiration Date: 202305112359
ISSUE DATE / TIME: 202304251322
Unit Type and Rh: 1700

## 2021-09-12 NOTE — Telephone Encounter (Signed)
No additional notes needed  

## 2021-09-14 ENCOUNTER — Encounter (HOSPITAL_BASED_OUTPATIENT_CLINIC_OR_DEPARTMENT_OTHER): Payer: Medicaid Other | Admitting: General Surgery

## 2021-09-14 DIAGNOSIS — L97818 Non-pressure chronic ulcer of other part of right lower leg with other specified severity: Secondary | ICD-10-CM | POA: Diagnosis not present

## 2021-09-15 NOTE — Discharge Summary (Addendum)
[facility] Discharge Summary  ? ?Patient ID: ?[name] ?MRN: [id number] ?DOB/AGE: 1970-06-14 51 y.o. ? ?Admit date: 09/11/2021 ?Discharge date: 09/11/2021 ? ?Primary Care Physician:  [name], FNP ? ?Admission Diagnoses:  ?Principal Problem: ?  Sickle cell pain crisis ([place]) ? ?Discharge Medications: ? ?Allergies as of 09/11/2021   ?No Known Allergies ?  ? ?  ?Medication List  ?  ? ?TAKE these medications   ? ?aspirin EC 81 MG tablet ?Take 1 tablet (81 mg total) by mouth daily. ?  ?Deferiprone 500 MG Tabs ?Take 500 mg by mouth daily at 12 noon. ?  ?folic acid 1 MG tablet ?Commonly known as: FOLVITE ?Take 5 tablets (5 mg total) by mouth daily. ?What changed: how much to take ?  ?gabapentin 300 MG capsule ?Commonly known as: NEURONTIN ?TAKE 1 CAPSULE(300 MG) BY MOUTH THREE TIMES DAILY ?  ?ibuprofen 600 MG tablet ?Commonly known as: ADVIL ?Take 1 tablet (600 mg total) by mouth every 6 (six) hours as needed for mild pain. ?  ?Juven Powd ?Take 1 each by mouth 3 (three) times daily between meals. ?  ?feeding supplement Liqd ?Take 237 mLs by mouth 2 (two) times daily between meals. ?  ?Oxbryta 500 MG Tabs tablet ?Generic drug: voxelotor ?Take 1,000 mg by mouth daily. ?  ?Oxycodone HCl 10 MG Tabs ?Take 1 tablet (10 mg total) by mouth every 4 (four) hours while awake. ?  ? ?  ? ? ? ?Consults:  None ? ?Significant Diagnostic Studies:  ?No results found. ? ?History of present illness:  ?[name] is a 51 year old female with a medical history significant for sickle cell disease, chronic pain syndrome, opiate dependence and tolerance, anemia of chronic disease, and bilateral lower extremity ulcers presents today hospital with complaints of upper and lower extremity pain.  Patient states that pain intensity has been increasing over the past several days and unrelieved by her home medications.  She attributes pain crisis to increase stressors and changes in weather.  She last had oxycodone this a.m.  without very much relief.  Patient transition from primary care.  She rates pain as 8/10, intermittent, and sharp.  She denies fever, chills, chest pain, or shortness of breath.  No sick contacts, recent travel, or known exposure to COVID-19. ?[facility] Course: ? ?Sickle cell disease with pain crisis: ?Patient admitted to sickle cell day infusion clinic for management of pain crisis ?Pain managed with IV Dilaudid PCA ?Toradol 15 mg IV x1 ?Tylenol 1000 mg x 1 ?IV fluids, 0.45% saline at 75 mL/h ?Patient's pain intensity decreased throughout admission.  Patient will discharge home today. ? ?Anemia of chronic disease: ?Patient's hemoglobin decreased below baseline at 6.5 g/dL.  Patient transfuse 1 unit PRBCs without complication.  She will return to clinic in 1 week to repeat CBC with differential.  Goal is to maintain hemoglobin above 8 g/dL.  Prior to discharge, patient's hemoglobin improved to 8.3 g/dL. ? ?Patient is alert, oriented, and ambulating without assistance.  She will discharge home in a hemodynamically stable condition. ? ?Discharge instructions: ?Resume all home medications.  ? ?Follow up with PCP as previously  scheduled.  ? ?Discussed the importance of drinking 64 ounces of water daily, dehydration of red blood cells may lead further sickling.  ? ?Avoid all stressors that precipitate sickle cell pain crisis.   ? ? ?The patient was given clear instructions to go to ER or return to medical center if symptoms do not improve,  worsen or new problems develop.  ? ?Physical Exam at Discharge: ? ?BP 136/74   Pulse 64   Temp 97.7 ?F (36.5 ?C) (Oral)   Resp 10   LMP 07/30/2021   SpO2 97%  ? ?Physical Exam ?Constitutional:   ?   Appearance: Normal appearance.  ?Eyes:  ?   General: Scleral icterus present.  ?   Pupils: Pupils are equal, round, and reactive to light.  ?Cardiovascular:  ?   Rate and Rhythm: Normal rate and regular rhythm.  ?   Pulses: Normal pulses.  ?Pulmonary:  ?   Effort:  Pulmonary effort is normal.  ?   Breath sounds: Normal breath sounds.  ?Abdominal:  ?   General: There is distension.  ?Musculoskeletal:     ?   General: Normal range of motion.  ?Neurological:  ?   General: No focal deficit present.  ?   Mental Status: She is alert. Mental status is at baseline.  ?Psychiatric:     ?   Mood and Affect: Mood normal.     ?   Thought Content: Thought content normal.     ?   Judgment: Judgment normal.  ? ? ? ?Disposition at Discharge: ?Discharge disposition: 01-Home or Self Care ? ? ? ? ? ? ?Discharge Orders: ?Discharge Instructions   ? ? Discharge patient   Complete by: As directed ?  ? Discharge disposition: 01-Home or Self Care  ? Discharge patient date: 09/11/2021  ? ?  ? ? ?Condition at Discharge:   Stable ? ?Time spent on Discharge:  Greater than 30 minutes. ? ?Signed: ?[name]  APRN, MSN, FNP-C ?[facility] [place] ?[facility] ?[address]  ?[place], [place] [address] ?[phone] ? ?09/15/2021, 10:26 AM ? ?Evaluation and management procedures were performed by the Advanced Practitioner under my supervision and collaboration. I have reviewed the Advanced Practitioner's note and chart, and I agree with the management and plan. ? ? ?Angelica Chessman, MD, MHA, CPE, FACP, FAAPL ?[place] ?[place], [place] ?[phone] ?09/15/2021, 12:35 PM  ?

## 2021-09-15 NOTE — H&P (Addendum)
?[facility] History and Physical  ? ?Date: 09/15/2021 ? ?Patient name: [name] Medical record number: [id number] ?Date of birth: 1971-03-07 Age: 51 y.o. Gender: female ?PCP: [name], FNP ? ?Attending physician: No att. providers found ? ?Chief Complaint: Sickle cell pain  ? ?History of Present Illness: ?[name] is a 51 year old female with a medical history significant for sickle cell disease, chronic pain syndrome, opiate dependence and tolerance, anemia of chronic disease, and bilateral lower extremity ulcers presents today hospital with complaints of upper and lower extremity pain.  Patient states that pain intensity has been increasing over the past several days and unrelieved by her home medications.  She attributes pain crisis to increase stressors and changes in weather.  She last had oxycodone this a.m. without very much relief.  Patient transition from primary care.  She rates pain as 8/10, intermittent, and sharp.  She denies fever, chills, chest pain, or shortness of breath.  No sick contacts, recent travel, or known exposure to COVID-19. ? ?Meds: ?No medications prior to admission.  ? ? ?Allergies: ?Patient has no known allergies. ?Past Medical History:  ?Diagnosis Date  ? Anemia 03/30/2012  ? Hx of sickle cell disease  ? CAP (community acquired pneumonia)   ? 2014  ? Cholelithiasis 03/30/2012  ? Chronic wound of extremity 04/01/2012  ? Elevated LFTs   ? Leg ulcer ([place]) 10/27/2012  ? Chronic under care of wound clinic  ? Multiple open wounds of lower extremity   ? chronic wounds B/LLE  ? Sickle cell disease ([place])   ? Sinus bradycardia by electrocardiogram 04/03/2012  ? ?Past Surgical History:  ?Procedure Laterality Date  ? ALLOGRAFT APPLICATION Right 5/63/8756  ? Procedure: SURGICAL PREP FOR GRAFTING RIGHT LOWER EXTREMITY AND APPLICATION OF Jannifer Hick;  Surgeon: Irene Limbo, MD;  Location: [place];  Service: Plastics;  Laterality: Right;  ?  APPLICATION OF A-CELL OF EXTREMITY Right 10/09/2015  ? Procedure: APPLICATION OF Jannifer Hick;  Surgeon: Irene Limbo, MD;  Location: [place];  Service: Plastics;  Laterality: Right;  ? BREAST SURGERY    ? left breast cyst aspiration  ? CESAREAN SECTION    ? CESAREAN SECTION N/A 01/06/2014  ? Procedure: CESAREAN SECTION;  Surgeon: Emily Filbert, MD;  Location: [facility];  Service: Obstetrics;  Laterality: N/A;  ? CESAREAN SECTION N/A 05/04/2017  ? Procedure: CESAREAN SECTION;  Surgeon: Woodroe Mode, MD;  Location: [place];  Service: Obstetrics;  Laterality: N/A;  ? I & D EXTREMITY Right 10/09/2015  ? Procedure: SURGICAL PREPARATION FOR GRAFTING RIGHT ANKLE AND APPLICATION THERASKIN;  Surgeon: Irene Limbo, MD;  Location: [place];  Service: Plastics;  Laterality: Right;  ? SKIN FULL THICKNESS GRAFT Bilateral 06/17/2016  ? Procedure: SURGICAL PREP FOR GRAFTING, BILATERAL LOWER EXTREMITIES AND APPLICATION OF Jannifer Hick;  Surgeon: Irene Limbo, MD;  Location: [place];  Service: Plastics;  Laterality: Bilateral;  ? TONSILLECTOMY    ? ?Family History  ?Problem Relation Age of Onset  ? Sickle cell anemia Sister   ? Diabetes Mother   ? Asthma Mother   ? Sickle cell trait Mother   ? Sickle cell trait Father   ? Hypertension Father   ? ?Social History  ? ?Socioeconomic History  ? Marital status: Married  ?  Spouse name: [name]  ? Number of children: 1  ? Years of education: Not on file  ? Highest education level: Not on file  ?Occupational History  ?  Occupation: Employed in home care.  ?Tobacco Use  ? Smoking status: Never  ?  Passive exposure: Never  ? Smokeless tobacco: Never  ?Vaping Use  ? Vaping Use: Never used  ?Substance and Sexual Activity  ? Alcohol use: No  ? Drug use: No  ? Sexual activity: Yes  ?  Birth control/protection: None  ?Other Topics Concern  ? Not on file  ?Social History Narrative  ? Lives with husband.  ? ?Social Determinants of Health   ? ?Financial Resource Strain: Not on file  ?Food Insecurity: Not on file  ?Transportation Needs: Not on file  ?Physical Activity: Not on file  ?Stress: Not on file  ?Social Connections: Not on file  ?Intimate Partner Violence: Not on file  ? ?Review of Systems  ?Constitutional: Negative.   ?HENT: Negative.    ?Eyes: Negative.   ?Respiratory: Negative.    ?Cardiovascular: Negative.   ?Genitourinary: Negative.   ?Musculoskeletal:  Positive for back pain and joint pain.  ?Skin: Negative.   ?Psychiatric/Behavioral: Negative.    ? ?Physical Exam: ?Blood pressure 136/74, pulse 64, temperature 97.7 ?F (36.5 ?C), temperature source Oral, resp. rate 10, last menstrual period 07/30/2021, SpO2 97 %, unknown if currently breastfeeding. ?Physical Exam ?Constitutional:   ?   Appearance: Normal appearance.  ?Eyes:  ?   General: Scleral icterus present.  ?   Pupils: Pupils are equal, round, and reactive to light.  ?Cardiovascular:  ?   Rate and Rhythm: Normal rate and regular rhythm.  ?Pulmonary:  ?   Effort: Pulmonary effort is normal.  ?Abdominal:  ?   General: Bowel sounds are normal.  ?Neurological:  ?   General: No focal deficit present.  ?   Mental Status: She is alert. Mental status is at baseline.  ?Psychiatric:     ?   Mood and Affect: Mood normal.     ?   Behavior: Behavior normal.     ?   Thought Content: Thought content normal.     ?   Judgment: Judgment normal.  ? ? ? ?Lab results: ?Results for orders placed or performed during the hospital encounter of 09/11/21 (from the past 24 hour(s))  ?BLOOD TRANSFUSION REPORT - SCANNED     Status: None  ? Collection Time: 09/14/21 11:56 AM  ? Narrative  ? Ordered by an unspecified provider.  ? ? ?Imaging results:  ?No results found. ? ? ?Assessment & Plan: ?Patient admitted to sickle cell day infusion center for management of pain crisis.  ?Patient is opiate naive ?Initiate IV dilaudid PCA.  ?IV fluids, 0.45% saline at 100 ml/hr ?Toradol 15 mg IV times one dose ?Tylenol 1000 mg  by mouth times one dose ?Review CBC with differential, complete metabolic panel, and reticulocytes as results become available. Baseline hemoglobin is 8-9 ?Type and Scren ?Pain intensity will be reevaluated in context of functioning and relationship to baseline as care progresses ?If pain intensity remains elevated and/or sudden change in hemodynamic stability transition to inpatient services for higher level of care.  ? ? ? ?[name]  APRN, MSN, FNP-C ?[facility] ?[facility] ?[address]  ?[place], [place] [address] ?[phone] ? ?09/15/2021, 10:21 AM ?

## 2021-09-17 NOTE — Progress Notes (Signed)
[name], OIEN ([id number]) ?Visit Report for 09/14/2021 ?Chief Complaint Document Details ?Patient Name: Date of Service: ?[name], [place] [name] 09/14/2021 11:00 A M ?Medical Record Number: [id number] ?Patient Account Number: [id number] ?Date of Birth/Sex: Treating RN: ?1970/09/11 (51 y.o. F) [name], [name] ?Primary Care Provider: [name] Other Clinician: ?Referring Provider: ?Treating Provider/Extender: Fredirick Maudlin ?[name] ?Weeks in Treatment: 21 ?Information Obtained from: Patient ?Chief Complaint ?the patient is here for evaluation of her bilateral lower extremity sickle cell ulcers ?04/17/2021; patient comes in for substantial wounds on the right and left lower leg ?Electronic Signature(s) ?Signed: 09/14/2021 11:31:59 AM By: Fredirick Maudlin MD FACS ?Entered By: Fredirick Maudlin on 09/14/2021 11:31:59 ?-------------------------------------------------------------------------------- ?Debridement Details ?Patient Name: Date of Service: ?[name], [place] [name] 09/14/2021 11:00 A M ?Medical Record Number: [id number] ?Patient Account Number: [id number] ?Date of Birth/Sex: Treating RN: ?03-29-1971 (51 y.o. [name], [name] ?Primary Care Provider: [name] Other Clinician: ?Referring Provider: ?Treating Provider/Extender: Fredirick Maudlin ?[name] ?Weeks in Treatment: 21 ?Debridement Performed for Assessment: Wound #17 Right,Lateral Lower Leg ?Performed By: Physician Fredirick Maudlin, MD ?Debridement Type: Debridement ?Level of Consciousness (Pre-procedure): Awake and Alert ?Pre-procedure Verification/Time Out Yes - 11:17 ?Taken: ?Start Time: 11:17 ?T Area Debrided (L x W): ?otal 7.6 (cm) x 4.4 (cm) = 33.44 (cm?) ?Tissue and other material debrided: Non-Viable, [place], [place] ?Level: Non-Viable Tissue ?Debridement Description: Selective/Open Wound ?Instrument: Curette ?Bleeding: Minimum ?Hemostasis Achieved: Pressure ?End Time: 11:19 ?Procedural Pain: 0 ?Post Procedural Pain: 0 ?Response to  Treatment: Procedure was tolerated well ?Level of Consciousness (Post- Awake and Alert ?procedure): ?Post Debridement Measurements of Total Wound ?Length: (cm) 7.6 ?Width: (cm) 4.4 ?Depth: (cm) 0.1 ?Volume: (cm?) 2.626 ?Character of Wound/Ulcer Post Debridement: Requires Further Debridement ?Post Procedure Diagnosis ?Same as Pre-procedure ?Electronic Signature(s) ?Signed: 09/14/2021 12:24:03 PM By: Fredirick Maudlin MD FACS ?Signed: 09/17/2021 5:50:11 PM By: Levan Hurst RN, BSN ?Entered By: Levan Hurst on 09/14/2021 11:17:42 ?-------------------------------------------------------------------------------- ?Debridement Details ?Patient Name: ?Date of Service: ?[name], [place] [name] 09/14/2021 11:00 A M ?Medical Record Number: [id number] ?Patient Account Number: [id number] ?Date of Birth/Sex: ?Treating RN: ?1971/04/14 (51 y.o. [name], [name] ?Primary Care Provider: [name] ?Other Clinician: ?Referring Provider: ?Treating Provider/Extender: Fredirick Maudlin ?[name] ?Weeks in Treatment: 21 ?Debridement Performed for Assessment: Wound #21 Right,Medial Ankle ?Performed By: Physician Fredirick Maudlin, MD ?Debridement Type: Debridement ?Severity of Tissue Pre Debridement: Fat layer exposed ?Level of Consciousness (Pre-procedure): Awake and Alert ?Pre-procedure Verification/Time Out Yes - 11:17 ?Taken: ?Start Time: 11:19 ?T Area Debrided (L x W): ?otal 6 (cm) x 3.3 (cm) = 19.8 (cm?) ?Tissue and other material debrided: Non-Viable, [place], [place] ?Level: Non-Viable Tissue ?Debridement Description: Selective/Open Wound ?Instrument: Curette ?Bleeding: Minimum ?Hemostasis Achieved: Pressure ?End Time: 11:21 ?Procedural Pain: 0 ?Post Procedural Pain: 0 ?Response to Treatment: Procedure was tolerated well ?Level of Consciousness (Post- Awake and Alert ?procedure): ?Post Debridement Measurements of Total Wound ?Length: (cm) 6 ?Width: (cm) 3.3 ?Depth: (cm) 0.1 ?Volume: (cm?) 1.555 ?Character of Wound/Ulcer Post  Debridement: Requires Further Debridement ?Severity of Tissue Post Debridement: Fat layer exposed ?Post Procedure Diagnosis ?Same as Pre-procedure ?Electronic Signature(s) ?Signed: 09/14/2021 12:24:03 PM By: Fredirick Maudlin MD FACS ?Signed: 09/17/2021 5:50:11 PM By: Levan Hurst RN, BSN ?Entered By: Levan Hurst on 09/14/2021 11:19:18 ?-------------------------------------------------------------------------------- ?Debridement Details ?Patient Name: ?Date of Service: ?[name], [place] [name] 09/14/2021 11:00 A M ?Medical Record Number: [id number] ?Patient Account Number: [id number] ?Date of Birth/Sex: Treating RN: ?[birth year] (51 y.o. [name], [name] ?Primary Care Provider: Other Clinician: ?[name] ?Referring Provider: ?Treating  Provider/Extender: Fredirick Maudlin ?[name] ?Weeks in Treatment: 21 ?Debridement Performed for Assessment: Wound #19 Left,Medial Ankle ?Performed By: Physician Fredirick Maudlin, MD ?Debridement Type: Debridement ?Level of Consciousness (Pre-procedure): Awake and Alert ?Pre-procedure Verification/Time Out Yes - 11:17 ?Taken: ?Start Time: 11:21 ?T Area Debrided (L x W): ?otal 1.3 (cm) x 0.5 (cm) = 0.65 (cm?) ?Tissue and other material debrided: Non-Viable, [place], [place] ?Level: Non-Viable Tissue ?Debridement Description: Selective/Open Wound ?Instrument: Curette ?Bleeding: Minimum ?Hemostasis Achieved: Pressure ?End Time: 11:22 ?Procedural Pain: 0 ?Post Procedural Pain: 0 ?Response to Treatment: Procedure was tolerated well ?Level of Consciousness (Post- Awake and Alert ?procedure): ?Post Debridement Measurements of Total Wound ?Length: (cm) 1.3 ?Width: (cm) 0.5 ?Depth: (cm) 0.1 ?Volume: (cm?) 0.051 ?Character of Wound/Ulcer Post Debridement: Improved ?Post Procedure Diagnosis ?Same as Pre-procedure ?Electronic Signature(s) ?Signed: 09/14/2021 12:24:03 PM By: Fredirick Maudlin MD FACS ?Signed: 09/17/2021 5:50:11 PM By: Levan Hurst RN, BSN ?Entered By: Levan Hurst on 09/14/2021  11:21:43 ?-------------------------------------------------------------------------------- ?HPI Details ?Patient Name: Date of Service: ?[name], [place] [name] 09/14/2021 11:00 A M ?Medical Record Number: [id number] ?Patient Account Number: [id number] ?Date of Birth/Sex: Treating RN: ?[birth year] (50 y.o. F) [name], [name] ?Primary Care Provider: [name] Other Clinician: ?Referring Provider: ?Treating Provider/Extender: Fredirick Maudlin ?[name] ?Weeks in Treatment: 21 ?History of Present Illness ?Location: medial and lateral ankle region on the right and left medial malleolus ?Quality: Patient reports experiencing a shooting pain to affected area(s). ?Severity: Patient states wound(s) are getting worse. ?Duration: right lower extremity bimalleolar ulcers have been present for approximately 2 years; the rright meedial malleolus ulcer has been there proximally 6 ?months ?Timing: Pain in wound is constant (hurts all the time) ?Context: The wound would happen gradually ?ssociated Signs and Symptoms: Patient reports having increase discharge. ?A ?HPI Description: 51 year old patient with a history of sickle cell anemia who was last seen by me with ulceration of the right lower extremity above the ankle ?and was referred to Dr. Leland Johns for a surgical debridement as I was unable to do anything in the office due to excruciating pain. At that stage she was ?referred from the plastic surgery service to dermatology who treated her for a skin infection with doxycycline and then Levaquin and a local antibiotic ?ointment. I understand the patient has since developed ulceration on the left ankle both medial and lateral and was now referred back to the wound center as ?dermatology has finished the management. I do not have any notes from the dermatology department ?Old notes: ?51 year old patient with a history of sickle cell anemia, pain bilateral lower extremities, right lower extremity ulcer and has a history of  receiving a skin graft( ?[employer]) several months ago. She has been visiting the wound center [facility] and was seen by Dr. Dellia Nims and Dr. Leland Johns. ?after prolonged conservator therapy between July 2016 and [place]

## 2021-09-17 NOTE — Progress Notes (Signed)
[name], [name] ([id number]) ?Visit Report for 09/14/2021 ?Arrival Information Details ?Patient Name: Date of Service: ?[name], [place] [name] 09/14/2021 11:00 A M ?Medical Record Number: [id number] ?Patient Account Number: [id number] ?Date of Birth/Sex: Treating RN: ?07/06/1970 (51 y.o. [name] ?Primary Care Tacari Repass: [name] Other Clinician: ?Referring Lakyia Behe: ?Treating Ripken Rekowski/Extender: Fredirick Maudlin ?[name] ?Weeks in Treatment: 21 ?Visit Information History Since Last Visit ?Added or deleted any medications: No ?Patient Arrived: Wheel Chair ?Any new allergies or adverse reactions: No ?Arrival Time: 10:47 ?Had a fall or experienced change in No ?Accompanied By: alone ?activities of daily living that may affect ?Transfer Assistance: Manual ?risk of falls: ?Patient Identification Verified: Yes ?Signs or symptoms of abuse/neglect since last visito No ?Secondary Verification Process Completed: Yes ?Hospitalized since last visit: No ?Patient Requires Transmission-Based Precautions: No ?Implantable device outside of the clinic excluding No ?Patient Has Alerts: No ?cellular tissue based products placed in the center ?since last visit: ?Has Dressing in Place as Prescribed: Yes ?Has Compression in Place as Prescribed: Yes ?Pain Present Now: No ?Electronic Signature(s) ?Signed: 09/17/2021 5:50:11 PM By: Levan Hurst RN, BSN ?Entered By: Levan Hurst on 09/14/2021 10:48:15 ?-------------------------------------------------------------------------------- ?Encounter Discharge Information Details ?Patient Name: Date of Service: ?[name], [place] [name] 09/14/2021 11:00 A M ?Medical Record Number: [id number] ?Patient Account Number: [id number] ?Date of Birth/Sex: Treating RN: ?1971/03/08 (51 y.o. F) [name], [name] ?Primary Care Zariana Strub: [name] Other Clinician: ?Referring Myrle Dues: ?Treating Donyea Gafford/Extender: Fredirick Maudlin ?[name] ?Weeks in Treatment: 21 ?Encounter Discharge Information  Items Post Procedure Vitals ?Discharge Condition: Stable ?Temperature (F): 98 ?Ambulatory Status: Wheelchair ?Pulse (bpm): 73 ?Discharge Destination: Home ?Respiratory Rate (breaths/min): 18 ?Transportation: Private Auto ?Blood Pressure (mmHg): 129/80 ?Accompanied By: self ?Schedule Follow-up Appointment: Yes ?Clinical Summary of Care: Patient Declined ?Electronic Signature(s) ?Signed: 09/14/2021 1:54:29 PM By: Dellie Catholic RN ?Entered By: Dellie Catholic on 09/14/2021 13:53:55 ?-------------------------------------------------------------------------------- ?Lower Extremity Assessment Details ?Patient Name: ?Date of Service: ?[name], [place] [name] 09/14/2021 11:00 A M ?Medical Record Number: [id number] ?Patient Account Number: [id number] ?Date of Birth/Sex: ?Treating RN: ?[birth year] (51 y.o. [name] ?Primary Care Brannon Decaire: [name] ?Other Clinician: ?Referring Asianna Brundage: ?Treating Darry Kelnhofer/Extender: Fredirick Maudlin ?[name] ?Weeks in Treatment: 21 ?Edema Assessment ?Assessed: [Left: No] [Right: No] ?Edema: [Left: No] [Right: No] ?Calf ?Left: Right: ?Point of Measurement: 33 cm From Medial Instep 25 cm 28 cm ?Ankle ?Left: Right: ?Point of Measurement: 10 cm From Medial Instep 16 cm 19 cm ?Vascular Assessment ?Pulses: ?Dorsalis Pedis ?Palpable: [Left:Yes] [Right:Yes] ?Electronic Signature(s) ?Signed: 09/17/2021 5:50:11 PM By: Levan Hurst RN, BSN ?Entered By: Levan Hurst on 09/14/2021 11:03:30 ?-------------------------------------------------------------------------------- ?Multi Wound Chart Details ?Patient Name: ?Date of Service: ?[name], [place] [name] 09/14/2021 11:00 A M ?Medical Record Number: [id number] ?Patient Account Number: [id number] ?Date of Birth/Sex: ?Treating RN: ?March 23, 1971 (51 y.o. F) [name], [name] ?Primary Care Shany Marinez: [name] ?Other Clinician: ?Referring Graham Doukas: ?Treating Ranger Petrich/Extender: Fredirick Maudlin ?[name] ?Weeks in Treatment: 21 ?Vital  Signs ?Height(in): 67 ?Pulse(bpm): 73 ?Weight(lbs): ?Blood Pressure(mmHg): 129/80 ?Body Mass Index(BMI): ?Temperature(??F): 98.0 ?Respiratory Rate(breaths/min): 18 ?Photos: ?Right, Lateral Lower Leg Left, Medial Ankle Right, Medial Ankle ?Wound Location: ?Gradually Appeared Gradually Appeared Gradually Appeared ?Wounding Event: ?Sickle Cell Lesion Sickle Cell Lesion Sickle Cell Lesion ?Primary Etiology: ?N/A N/A Venous Leg Ulcer ?Secondary Etiology: ?Anemia, Sickle Cell Disease, Anemia, Sickle Cell Disease, Anemia, Sickle Cell Disease, ?Comorbid History: ?Neuropathy Neuropathy Neuropathy ?10/05/2012 12/25/2020 06/26/2021 ?Date Acquired: ?'21 21 11 '$ ?Weeks of Treatment: ?Open Open Open ?Wound Status: ?No No No ?Wound Recurrence: ?Yes No  No ?Clustered Wound: ?7.6x4.4x0.1 1.3x0.5x0.1 6x3.3x0.1 ?Measurements L x W x D (cm) ?26.264 0.511 15.551 ?A (cm?) : ?rea ?2.626 0.051 1.555 ?Volume (cm?) : ?86.00% 97.60% 46.30% ?% Reduction in A rea: ?86.00% 99.70% 46.30% ?% Reduction in Volume: ?Full Thickness Without Exposed Full Thickness Without Exposed Full Thickness Without Exposed ?Classification: ?Support Structures Support Structures Support Structures ?Medium Medium Medium ?Exudate A mount: ?Purulent Serosanguineous Purulent ?Exudate Type: ?yellow, brown, green red, brown yellow, brown, green ?Exudate Color: ?Flat and Intact Flat and Intact Distinct, outline attached ?Wound Margin: ?Small (1-33%) Medium (34-66%) Medium (34-66%) ?Granulation A mount: ?Pink, Hyper-granulation Pink Pink ?Granulation Quality: ?Large (67-100%) Medium (34-66%) Medium (34-66%) ?Necrotic A mount: ?Fat Layer (Subcutaneous Tissue): Yes Fat Layer (Subcutaneous Tissue): Yes Fat Layer (Subcutaneous Tissue): Yes ?Exposed Structures: ?Fascia: No ?Fascia: No ?Fascia: No ?Tendon: No ?Tendon: No ?Tendon: No ?Muscle: No ?Muscle: No ?Muscle: No ?Joint: No ?Joint: No ?Joint: No ?Bone: No ?Bone: No ?Bone: No ?Small (1-33%) Medium (34-66%) Medium  (34-66%) ?Epithelialization: ?Debridement - Selective/Open Wound Debridement - Selective/Open Wound Debridement - Selective/Open Wound ?Debridement: ?Pre-procedure Verification/Time Out 11:17 11:17 11:17 ?Taken: ?[employer] ?Tissue Debrided: ?Non-Viable Tissue Non-Viable Tissue Non-Viable Tissue ?Level: ?33.44 0.65 19.8 ?Debridement A (sq cm): ?rea ?Curette Curette Curette ?Instrument: ?Minimum Minimum Minimum ?Bleeding: ?Pressure Pressure Pressure ?Hemostasis A chieved: ?0 0 0 ?Procedural Pain: ?0 0 0 ?Post Procedural Pain: ?Procedure was tolerated well Procedure was tolerated well Procedure was tolerated well ?Debridement Treatment Response: ?7.6x4.4x0.1 1.3x0.5x0.1 6x3.3x0.1 ?Post Debridement Measurements L x ?W x D (cm) ?2.626 0.051 1.555 ?Post Debridement Volume: (cm?) ?Debridement Debridement Debridement ?Procedures Performed: ?Treatment Notes ?Electronic Signature(s) ?Signed: 09/14/2021 11:31:27 AM By: Fredirick Maudlin MD FACS ?Signed: 09/14/2021 1:54:29 PM By: Dellie Catholic RN ?Entered By: Fredirick Maudlin on 09/14/2021 11:31:27 ?-------------------------------------------------------------------------------- ?Multi-Disciplinary Care Plan Details ?Patient Name: ?Date of Service: ?[name], [place] [name] 09/14/2021 11:00 A M ?Medical Record Number: [id number] ?Patient Account Number: [id number] ?Date of Birth/Sex: ?Treating RN: ?29-Sep-1970 (51 y.o. [name] ?Primary Care Antone Summons: [name] ?Other Clinician: ?Referring Luwanna Brossman: ?Treating Nero Sawatzky/Extender: Fredirick Maudlin ?[name] ?Weeks in Treatment: 21 ?Multidisciplinary Care Plan reviewed with physician ?Active Inactive ?Venous Leg Ulcer ?Nursing Diagnoses: ?Actual venous Insuffiency (use after diagnosis is confirmed) ?Knowledge deficit related to disease process and management ?Goals: ?Patient will maintain optimal edema control ?Date Initiated: 06/26/2021 ?Target Resolution Date: 10/12/2021 ?Goal Status:  Active ?Interventions: ?Assess peripheral edema status every visit. ?Compression as ordered ?Treatment Activities: ?Therapeutic compression applied : 06/26/2021 ?Notes: ?Wound/Skin Impairment ?Nursing Diagnoses: ?Impaired tissue integrity ?Goals: ?Patient/caregiver

## 2021-09-20 ENCOUNTER — Encounter (HOSPITAL_BASED_OUTPATIENT_CLINIC_OR_DEPARTMENT_OTHER): Payer: Medicaid Other | Attending: General Surgery | Admitting: General Surgery

## 2021-09-20 ENCOUNTER — Telehealth: Payer: Self-pay

## 2021-09-20 DIAGNOSIS — D571 Sickle-cell disease without crisis: Secondary | ICD-10-CM | POA: Insufficient documentation

## 2021-09-20 DIAGNOSIS — L97818 Non-pressure chronic ulcer of other part of right lower leg with other specified severity: Secondary | ICD-10-CM | POA: Insufficient documentation

## 2021-09-20 NOTE — Telephone Encounter (Signed)
Oxycodone HCl 10 MG TABS ? ?PA started on 09/20/21 ?

## 2021-09-20 NOTE — Progress Notes (Signed)
[name], [name] ([id number]) ?Visit Report for 09/20/2021 ?Arrival Information Details ?Patient Name: Date of Service: ?[name], [name] 09/20/2021 10:15 A M ?Medical Record Number: [id number] ?Patient Account Number: [id number] ?Date of Birth/Sex: Treating RN: ?1970-06-01 (51 y.o. F) [name], [name] ?Primary Care Billye Nydam: [name] Other Clinician: ?Referring Taran Haynesworth: ?Treating Xachary Hambly/Extender: Fredirick Maudlin ?[name] ?Weeks in Treatment: 22 ?Visit Information History Since Last Visit ?Added or deleted any medications: No ?Patient Arrived: Ambulatory ?Any new allergies or adverse reactions: No ?Arrival Time: 10:44 ?Had a fall or experienced change in No ?Accompanied By: self ?activities of daily living that may affect ?Transfer Assistance: None ?risk of falls: ?Patient Identification Verified: Yes ?Signs or symptoms of abuse/neglect since last visito No ?Secondary Verification Process Completed: Yes ?Hospitalized since last visit: No ?Patient Requires Transmission-Based Precautions: No ?Implantable device outside of the clinic excluding No ?Patient Has Alerts: No ?cellular tissue based products placed in the center ?since last visit: ?Has Dressing in Place as Prescribed: Yes ?Pain Present Now: No ?Electronic Signature(s) ?Signed: 09/20/2021 11:10:54 AM By: Sandre Kitty ?Entered By: Sandre Kitty on 09/20/2021 10:44:30 ?-------------------------------------------------------------------------------- ?Encounter Discharge Information Details ?Patient Name: Date of Service: ?[name], [name] 09/20/2021 10:15 A M ?Medical Record Number: [id number] ?Patient Account Number: [id number] ?Date of Birth/Sex: Treating RN: ?May 17, 1971 (51 y.o. F) [name], [name] ?Primary Care Cadon Raczka: [name] Other Clinician: ?Referring Ebelyn Bohnet: ?Treating Abriana Saltos/Extender: Fredirick Maudlin ?[name] ?Weeks in Treatment: 22 ?Encounter Discharge Information Items Post Procedure Vitals ?Discharge Condition:  Stable ?Temperature (F): 98 ?Ambulatory Status: Wheelchair ?Pulse (bpm): 77 ?Discharge Destination: Home ?Respiratory Rate (breaths/min): 18 ?Transportation: Other ?Blood Pressure (mmHg): 124/65 ?Accompanied By: self ?Schedule Follow-up Appointment: Yes ?Clinical Summary of Care: Patient Declined ?Electronic Signature(s) ?Signed: 09/20/2021 5:20:39 PM By: Dellie Catholic RN ?Entered By: Dellie Catholic on 09/20/2021 17:20:16 ?-------------------------------------------------------------------------------- ?Lower Extremity Assessment Details ?Patient Name: ?Date of Service: ?[name], [name] 09/20/2021 10:15 A M ?Medical Record Number: [id number] ?Patient Account Number: [id number] ?Date of Birth/Sex: ?Treating RN: ?[birth year] (51 y.o. F) [name], [name] ?Primary Care Madeliene Tejera: [name] ?Other Clinician: ?Referring Arthea Nobel: ?Treating Sherral Dirocco/Extender: Fredirick Maudlin ?[name] ?Weeks in Treatment: 22 ?Edema Assessment ?Assessed: [Left: No] [Right: No] ?Edema: [Left: No] [Right: Yes] ?Calf ?Left: Right: ?Point of Measurement: 33 cm From Medial Instep 24.8 cm 28.6 cm ?Ankle ?Left: Right: ?Point of Measurement: 10 cm From Medial Instep 16 cm 19.1 cm ?Electronic Signature(s) ?Signed: 09/20/2021 5:20:39 PM By: Dellie Catholic RN ?Entered By: Dellie Catholic on 09/20/2021 11:06:21 ?-------------------------------------------------------------------------------- ?Multi Wound Chart Details ?Patient Name: ?Date of Service: ?[name], [name] 09/20/2021 10:15 A M ?Medical Record Number: [id number] ?Patient Account Number: [id number] ?Date of Birth/Sex: ?Treating RN: ?06-04-1970 (51 y.o. F) [name], [name] ?Primary Care Anaija Wissink: [name] ?Other Clinician: ?Referring Jahmia Berrett: ?Treating Riddik Senna/Extender: Fredirick Maudlin ?[name] ?Weeks in Treatment: 22 ?Vital Signs ?Height(in): 67 ?Pulse(bpm): 77 ?Weight(lbs): ?Blood Pressure(mmHg): 124/65 ?Body Mass Index(BMI): ?Temperature(??F): 98.0 ?Respiratory  Rate(breaths/min): 18 ?Photos: ?Right, Lateral Lower Leg Left, Medial Ankle Right, Medial Ankle ?Wound Location: ?Gradually Appeared Gradually Appeared Gradually Appeared ?Wounding Event: ?Sickle Cell Lesion Sickle Cell Lesion Sickle Cell Lesion ?Primary Etiology: ?N/A N/A Venous Leg Ulcer ?Secondary Etiology: ?Anemia, Sickle Cell Disease, Anemia, Sickle Cell Disease, Anemia, Sickle Cell Disease, ?Comorbid History: ?Neuropathy Neuropathy Neuropathy ?10/05/2012 12/25/2020 06/26/2021 ?Date Acquired: ?'22 22 12 '$ ?Weeks of Treatment: ?Open Open Open ?Wound Status: ?No No No ?Wound Recurrence: ?Yes No No ?Clustered Wound: ?7.5x4.5x0.1 1x0.2x0.1 5.8x3x0.1 ?Measurements L x W x D (cm) ?26.507 0.157 13.666 ?A (cm?) : ?rea ?  2.651 0.016 1.367 ?Volume (cm?) : ?85.90% 99.30% 52.80% ?% Reduction in Area: ?85.90% 99.90% 52.80% ?% Reduction in Volume: ?Full Thickness Without Exposed Full Thickness Without Exposed Full Thickness Without Exposed ?Classification: ?Support Structures Support Structures Support Structures ?Medium Medium Medium ?Exudate A mount: ?Purulent Serosanguineous Purulent ?Exudate Type: ?yellow, brown, green red, brown yellow, brown, green ?Exudate Color: ?Flat and Intact Flat and Intact Distinct, outline attached ?Wound Margin: ?Medium (34-66%) Small (1-33%) Medium (34-66%) ?Granulation A mount: ?Pink, Hyper-granulation Pink Pink ?Granulation Quality: ?Medium (34-66%) Large (67-100%) Medium (34-66%) ?Necrotic A mount: ?Fat Layer (Subcutaneous Tissue): Yes Fascia: No Fat Layer (Subcutaneous Tissue): Yes ?Exposed Structures: ?Fascia: No ?Fat Layer (Subcutaneous Tissue): No ?Fascia: No ?Tendon: No ?Tendon: No ?Tendon: No ?Muscle: No ?Muscle: No ?Muscle: No ?Joint: No ?Joint: No ?Joint: No ?Bone: No ?Bone: No ?Bone: No ?Small (1-33%) Medium (34-66%) Medium (34-66%) ?Epithelialization: ?Debridement - Selective/Open Wound Debridement - Selective/Open Wound Debridement - Selective/Open Wound ?Debridement: ?Pre-procedure  Verification/Time Out 11:11 11:11 11:11 ?Taken: ?Other Other Other ?Pain Control: ?Other, Psychologist, prison and probation services, [employer], [employer] ?Tissue Debrided: ?Non-Viable Tissue Non-Viable Tissue Non-Viable Tissue ?Level: ?33.75 0.2 17.4 ?Debridement A (sq cm): ?rea ?Curette Curette Curette ?Instrument: ?Minimum Minimum Minimum ?Bleeding: ?Pressure Pressure Pressure ?Hemostasis A chieved: ?0 0 0 ?Procedural Pain: ?0 0 0 ?Post Procedural Pain: ?Procedure was tolerated well Procedure was tolerated well Procedure was tolerated well ?Debridement Treatment Response: ?7.5x4.5x0.1 1x0.2x0.1 5.8x3x0.1 ?Post Debridement Measurements L x ?W x D (cm) ?2.651 0.016 1.367 ?Post Debridement Volume: (cm?) ?Debridement Debridement Debridement ?Procedures Performed: ?Treatment Notes ?Electronic Signature(s) ?Signed: 09/20/2021 11:18:14 AM By: Fredirick Maudlin MD FACS ?Signed: 09/20/2021 5:20:39 PM By: Dellie Catholic RN ?Entered By: Fredirick Maudlin on 09/20/2021 11:18:14 ?-------------------------------------------------------------------------------- ?Multi-Disciplinary Care Plan Details ?Patient Name: ?Date of Service: ?[name], [name] 09/20/2021 10:15 A M ?Medical Record Number: [id number] ?Patient Account Number: [id number] ?Date of Birth/Sex: ?Treating RN: ?[birth year] (51 y.o. F) [name], [name] ?Primary Care Lavonne Kinderman: [name] ?Other Clinician: ?Referring Meadow Abramo: ?Treating Runette Scifres/Extender: Fredirick Maudlin ?[name] ?Weeks in Treatment: 22 ?Multidisciplinary Care Plan reviewed with physician ?Active Inactive ?Venous Leg Ulcer ?Nursing Diagnoses: ?Actual venous Insuffiency (use after diagnosis is confirmed) ?Knowledge deficit related to disease process and management ?Goals: ?Patient will maintain optimal edema control ?Date Initiated: 06/26/2021 ?Target Resolution Date: 10/12/2021 ?Goal Status: Active ?Interventions: ?Assess peripheral edema status every visit. ?Compression as ordered ?Treatment Activities: ?Therapeutic  compression applied : 06/26/2021 ?Notes: ?Wound/Skin Impairment ?Nursing Diagnoses: ?Impaired tissue integrity ?Goals: ?Patient/caregiver will verbalize understanding of skin care regimen ?Date Initiated: 04/17/2021 ?[place]

## 2021-09-20 NOTE — Progress Notes (Signed)
METHA, KOLASA ([id number]) ?Visit Report for 09/20/2021 ?Chief Complaint Document Details ?Patient Name: Date of Service: ?[name], [place] [name] 09/20/2021 10:15 A M ?Medical Record Number: [id number] ?Patient Account Number: [id number] ?Date of Birth/Sex: Treating RN: ?Feb 16, 1971 (51 y.o. F) [name], [name] ?Primary Care Provider: [name] Other Clinician: ?Referring Provider: ?Treating Provider/Extender: Fredirick Maudlin ?[name] ?Weeks in Treatment: 22 ?Information Obtained from: Patient ?Chief Complaint ?the patient is here for evaluation of her bilateral lower extremity sickle cell ulcers ?04/17/2021; patient comes in for substantial wounds on the right and left lower leg ?Electronic Signature(s) ?Signed: 09/20/2021 11:18:52 AM By: Fredirick Maudlin MD FACS ?Entered By: Fredirick Maudlin on 09/20/2021 11:18:51 ?-------------------------------------------------------------------------------- ?Debridement Details ?Patient Name: Date of Service: ?[name], [place] [name] 09/20/2021 10:15 A M ?Medical Record Number: [id number] ?Patient Account Number: [id number] ?Date of Birth/Sex: Treating RN: ?[birth year] (51 y.o. F) [name], [name] ?Primary Care Provider: [name] Other Clinician: ?Referring Provider: ?Treating Provider/Extender: Fredirick Maudlin ?[name] ?Weeks in Treatment: 22 ?Debridement Performed for Assessment: Wound #17 Right,Lateral Lower Leg ?Performed By: Physician Fredirick Maudlin, MD ?Debridement Type: Debridement ?Level of Consciousness (Pre-procedure): Awake and Alert ?Pre-procedure Verification/Time Out Yes - 11:11 ?Taken: ?Start Time: 11:11 ?Pain Control: ?Other : Benzocaine 20% ?T Area Debrided (L x W): ?otal 7.5 (cm) x 4.5 (cm) = 33.75 (cm?) ?Tissue and other material debrided: ?Non-Viable, [place], [place], Other: Biofilm ?Level: Non-Viable Tissue ?Debridement Description: Selective/Open Wound ?Instrument: Curette ?Bleeding: Minimum ?Hemostasis Achieved: Pressure ?End Time:  11:13 ?Procedural Pain: 0 ?Post Procedural Pain: 0 ?Response to Treatment: Procedure was tolerated well ?Level of Consciousness (Post- Awake and Alert ?procedure): ?Post Debridement Measurements of Total Wound ?Length: (cm) 7.5 ?Width: (cm) 4.5 ?Depth: (cm) 0.1 ?Volume: (cm?) 2.651 ?Character of Wound/Ulcer Post Debridement: Improved ?Post Procedure Diagnosis ?Same as Pre-procedure ?Electronic Signature(s) ?Signed: 09/20/2021 11:46:31 AM By: Fredirick Maudlin MD FACS ?Signed: 09/20/2021 5:20:39 PM By: Dellie Catholic RN ?Entered By: Dellie Catholic on 09/20/2021 11:15:28 ?-------------------------------------------------------------------------------- ?Debridement Details ?Patient Name: ?Date of Service: ?[name], [place] [name] 09/20/2021 10:15 A M ?Medical Record Number: [id number] ?Patient Account Number: [id number] ?Date of Birth/Sex: ?Treating RN: ?30-Nov-1970 (51 y.o. F) [name], [name] ?Primary Care Provider: [name] ?Other Clinician: ?Referring Provider: ?Treating Provider/Extender: Fredirick Maudlin ?[name] ?Weeks in Treatment: 22 ?Debridement Performed for Assessment: Wound #19 Left,Medial Ankle ?Performed By: Physician Fredirick Maudlin, MD ?Debridement Type: Debridement ?Level of Consciousness (Pre-procedure): Awake and Alert ?Pre-procedure Verification/Time Out Yes - 11:11 ?Taken: ?Start Time: 11:11 ?Pain Control: ?Other : Benzocaine 20% ?T Area Debrided (L x W): ?otal 1 (cm) x 0.2 (cm) = 0.2 (cm?) ?Tissue and other material debrided: Non-Viable, [place], [place], [place] ?Level: Non-Viable Tissue ?Debridement Description: Selective/Open Wound ?Instrument: Curette ?Bleeding: Minimum ?Hemostasis Achieved: Pressure ?End Time: 11:13 ?Procedural Pain: 0 ?Post Procedural Pain: 0 ?Response to Treatment: Procedure was tolerated well ?Level of Consciousness (Post- Awake and Alert ?procedure): ?Post Debridement Measurements of Total Wound ?Length: (cm) 1 ?Width: (cm) 0.2 ?Depth: (cm) 0.1 ?Volume: (cm?)  0.016 ?Character of Wound/Ulcer Post Debridement: Improved ?Post Procedure Diagnosis ?Same as Pre-procedure ?Electronic Signature(s) ?Signed: 09/20/2021 11:46:31 AM By: Fredirick Maudlin MD FACS ?Signed: 09/20/2021 5:20:39 PM By: Dellie Catholic RN ?Entered By: Dellie Catholic on 09/20/2021 11:15:42 ?-------------------------------------------------------------------------------- ?Debridement Details ?Patient Name: ?Date of Service: ?[name], [place] [name] 09/20/2021 10:15 A M ?Medical Record Number: [id number] ?Patient Account Number: [id number] ?Date of Birth/Sex: ?Treating RN: ?1971-01-04 (51 y.o. F) [name], [name] ?Primary Care Provider: [name] ?Other Clinician: ?Referring Provider: ?Treating Provider/Extender: Fredirick Maudlin ?[name] ?Weeks in  Treatment: 22 ?Debridement Performed for Assessment: Wound #21 Right,Medial Ankle ?Performed By: Physician Fredirick Maudlin, MD ?Debridement Type: Debridement ?Severity of Tissue Pre Debridement: Fat layer exposed ?Level of Consciousness (Pre-procedure): Awake and Alert ?Pre-procedure Verification/Time Out Yes - 11:11 ?Taken: ?Start Time: 11:11 ?Pain Control: ?Other : Benzocaine 20% ?T Area Debrided (L x W): ?otal 5.8 (cm) x 3 (cm) = 17.4 (cm?) ?Tissue and other material debrided: ?Non-Viable, [place], [place], Other: Biofilm ?Level: Non-Viable Tissue ?Debridement Description: Selective/Open Wound ?Instrument: Curette ?Bleeding: Minimum ?Hemostasis Achieved: Pressure ?End Time: 11:13 ?Procedural Pain: 0 ?Post Procedural Pain: 0 ?Response to Treatment: Procedure was tolerated well ?Level of Consciousness (Post- Awake and Alert ?procedure): ?Post Debridement Measurements of Total Wound ?Length: (cm) 5.8 ?Width: (cm) 3 ?Depth: (cm) 0.1 ?Volume: (cm?) 1.367 ?Character of Wound/Ulcer Post Debridement: Improved ?Severity of Tissue Post Debridement: Fat layer exposed ?Post Procedure Diagnosis ?Same as Pre-procedure ?Electronic Signature(s) ?Signed: 09/20/2021 11:46:31 AM By:  Fredirick Maudlin MD FACS ?Signed: 09/20/2021 5:20:39 PM By: Dellie Catholic RN ?Entered By: Dellie Catholic on 09/20/2021 11:16:27 ?-------------------------------------------------------------------------------- ?HPI Details ?Patient Name: ?Date of Service: ?[name], [place] [name] 09/20/2021 10:15 A M ?Medical Record Number: [id number] ?Patient Account Number: [id number] ?Date of Birth/Sex: ?Treating RN: ?1971-04-26 (51 y.o. F) [name], [name] ?Primary Care Provider: [name] ?Other Clinician: ?Referring Provider: ?Treating Provider/Extender: Fredirick Maudlin ?[name] ?Weeks in Treatment: 22 ?History of Present Illness ?Location: medial and lateral ankle region on the right and left medial malleolus ?Quality: Patient reports experiencing a shooting pain to affected area(s). ?Severity: Patient states wound(s) are getting worse. ?Duration: right lower extremity bimalleolar ulcers have been present for approximately 2 years; the rright meedial malleolus ulcer has been there proximally 6 ?months ?Timing: Pain in wound is constant (hurts all the time) ?Context: The wound would happen gradually ?ssociated Signs and Symptoms: Patient reports having increase discharge. ?A ?HPI Description: 51 year old patient with a history of sickle cell anemia who was last seen by me with ulceration of the right lower extremity above the ankle ?and was referred to Dr. Leland Johns for a surgical debridement as I was unable to do anything in the office due to excruciating pain. At that stage she was ?referred from the plastic surgery service to dermatology who treated her for a skin infection with doxycycline and then Levaquin and a local antibiotic ?ointment. I understand the patient has since developed ulceration on the left ankle both medial and lateral and was now referred back to the wound center as ?dermatology has finished the management. I do not have any notes from the dermatology department ?Old notes: ?51 year old patient with  a history of sickle cell anemia, pain bilateral lower extremities, right lower extremity ulcer and has a history of receiving a skin graft( ?[employer]) several months ago. She has been visiting the wound center [facility] and was

## 2021-09-25 NOTE — Telephone Encounter (Signed)
PA Case: [id number], Status: Approved, Coverage Starts on: 09/20/2021 12:00:00 AM, Coverage Ends on: 03/19/2022 12:00:00 AM. ?

## 2021-09-26 ENCOUNTER — Ambulatory Visit
Admission: RE | Admit: 2021-09-26 | Discharge: 2021-09-26 | Disposition: A | Discharge status: Home / Self Care | Payer: Medicaid Other | Source: Ambulatory Visit | Attending: Family Medicine | Admitting: Family Medicine

## 2021-09-26 ENCOUNTER — Ambulatory Visit: Payer: Medicaid Other

## 2021-09-26 DIAGNOSIS — N644 Mastodynia: Secondary | ICD-10-CM

## 2021-09-27 ENCOUNTER — Ambulatory Visit (HOSPITAL_BASED_OUTPATIENT_CLINIC_OR_DEPARTMENT_OTHER): Payer: Medicaid Other | Admitting: General Surgery

## 2021-10-04 ENCOUNTER — Encounter (HOSPITAL_BASED_OUTPATIENT_CLINIC_OR_DEPARTMENT_OTHER): Payer: Medicaid Other | Admitting: General Surgery

## 2021-10-04 DIAGNOSIS — L97818 Non-pressure chronic ulcer of other part of right lower leg with other specified severity: Secondary | ICD-10-CM | POA: Diagnosis not present

## 2021-10-10 ENCOUNTER — Other Ambulatory Visit: Payer: Self-pay | Admitting: Family Medicine

## 2021-10-11 ENCOUNTER — Encounter (HOSPITAL_BASED_OUTPATIENT_CLINIC_OR_DEPARTMENT_OTHER): Payer: Medicaid Other | Admitting: General Surgery

## 2021-10-11 DIAGNOSIS — L97818 Non-pressure chronic ulcer of other part of right lower leg with other specified severity: Secondary | ICD-10-CM | POA: Diagnosis not present

## 2021-10-11 NOTE — Progress Notes (Signed)
ROBINN, OVERHOLT ([id number]) Visit Report for 10/11/2021 Chief Complaint Document Details Patient Name: Date of Service: [name] 10/11/2021 10:15 A M Medical Record Number: [id number] Patient Account Number: [id number] Date of Birth/Sex: Treating RN: 1971/05/13 (51 y.o. [name] Primary Care Provider: [name] Other Clinician: Referring Provider: Treating Provider/Extender: Elyse Jarvis Weeks in Treatment: 25 Information Obtained from: Patient Chief Complaint the patient is here for evaluation of her bilateral lower extremity sickle cell ulcers 04/17/2021; patient comes in for substantial wounds on the right and left lower leg Electronic Signature(s) Signed: 10/11/2021 11:34:38 AM By: Fredirick Maudlin MD FACS Entered By: Fredirick Maudlin on 10/11/2021 11:34:38 -------------------------------------------------------------------------------- Debridement Details Patient Name: Date of Service: [name], [name] 10/11/2021 10:15 A M Medical Record Number: [id number] Patient Account Number: [id number] Date of Birth/Sex: Treating RN: [birth year] (51 y.o. [name] Primary Care Provider: [name] Other Clinician: Referring Provider: Treating Provider/Extender: Elyse Jarvis Weeks in Treatment: 25 Debridement Performed for Assessment: Wound #17 Right,Lateral Lower Leg Performed By: Physician Fredirick Maudlin, MD Debridement Type: Debridement Level of Consciousness (Pre-procedure): Awake and Alert Pre-procedure Verification/Time Out Yes - 11:25 Taken: Start Time: 11:25 Pain Control: Lidocaine 4% T opical Solution T Area Debrided (L x W): otal 7.2 (cm) x 4.4 (cm) = 31.68 (cm) Tissue and other material debrided: Non-Viable, Slough, Slough Level: Non-Viable Tissue Debridement Description: Selective/Open Wound Instrument: Curette Bleeding: Minimum Hemostasis Achieved: Pressure End Time: 11:27 Procedural  Pain: 0 Post Procedural Pain: 0 Response to Treatment: Procedure was tolerated well Level of Consciousness (Post- Awake and Alert procedure): Post Debridement Measurements of Total Wound Length: (cm) 7.2 Width: (cm) 4.4 Depth: (cm) 0.1 Volume: (cm) 2.488 Character of Wound/Ulcer Post Debridement: Improved Post Procedure Diagnosis Same as Pre-procedure Electronic Signature(s) Signed: 10/11/2021 12:10:13 PM By: Fredirick Maudlin MD FACS Signed: 10/11/2021 7:01:20 PM By: Dellie Catholic RN Entered By: Dellie Catholic on 10/11/2021 11:28:44 -------------------------------------------------------------------------------- Debridement Details Patient Name: Date of Service: [name], [name] 10/11/2021 10:15 A M Medical Record Number: [id number] Patient Account Number: [id number] Date of Birth/Sex: Treating RN: 1970/12/31 (51 y.o. [name] Primary Care Provider: [name] Other Clinician: Referring Provider: Treating Provider/Extender: Elyse Jarvis Weeks in Treatment: 25 Debridement Performed for Assessment: Wound #21 Right,Medial Ankle Performed By: Physician Fredirick Maudlin, MD Debridement Type: Debridement Severity of Tissue Pre Debridement: Fat layer exposed Level of Consciousness (Pre-procedure): Awake and Alert Pre-procedure Verification/Time Out Yes - 11:25 Taken: Start Time: 11:25 Pain Control: Lidocaine 4% T opical Solution T Area Debrided (L x W): otal 5.7 (cm) x 2.9 (cm) = 16.53 (cm) Tissue and other material debrided: Non-Viable, Slough, Slough Level: Non-Viable Tissue Debridement Description: Selective/Open Wound Instrument: Curette Bleeding: Minimum Hemostasis Achieved: Pressure End Time: 11:27 Procedural Pain: 0 Post Procedural Pain: 0 Response to Treatment: Procedure was tolerated well Level of Consciousness (Post- Awake and Alert procedure): Post Debridement Measurements of Total Wound Length: (cm) 5.7 Width: (cm)  2.9 Depth: (cm) 0.1 Volume: (cm) 1.298 Character of Wound/Ulcer Post Debridement: Improved Severity of Tissue Post Debridement: Fat layer exposed Post Procedure Diagnosis Same as Pre-procedure Electronic Signature(s) Signed: 10/11/2021 12:10:13 PM By: Fredirick Maudlin MD FACS Signed: 10/11/2021 7:01:20 PM By: Dellie Catholic RN Entered By: Dellie Catholic on 10/11/2021 11:29:50 -------------------------------------------------------------------------------- HPI Details Patient Name: Date of Service: [name], [name] 10/11/2021 10:15 A M Medical Record Number: [id number] Patient Account Number: [id number] Date of Birth/Sex: Treating RN: 1971/02/27 (51 y.o. F) Dellie Catholic Primary Care  Provider: [name] Other Clinician: Referring Provider: Treating Provider/Extender: Elyse Jarvis Weeks in Treatment: 25 History of Present Illness Location: medial and lateral ankle region on the right and left medial malleolus Quality: Patient reports experiencing a shooting pain to affected area(s). Severity: Patient states wound(s) are getting worse. Duration: right lower extremity bimalleolar ulcers have been present for approximately 2 years; the rright meedial malleolus ulcer has been there proximally 6 months Timing: Pain in wound is constant (hurts all the time) Context: The wound would happen gradually ssociated Signs and Symptoms: Patient reports having increase discharge. A HPI Description: 51 year old patient with a history of sickle cell anemia who was last seen by me with ulceration of the right lower extremity above the ankle and was referred to Dr. Leland Johns for a surgical debridement as I was unable to do anything in the office due to excruciating pain. At that stage she was referred from the plastic surgery service to dermatology who treated her for a skin infection with doxycycline and then Levaquin and a local antibiotic ointment. I understand the patient  has since developed ulceration on the left ankle both medial and lateral and was now referred back to the wound center as dermatology has finished the management. I do not have any notes from the dermatology department Old notes: 51 year old patient with a history of sickle cell anemia, pain bilateral lower extremities, right lower extremity ulcer and has a history of receiving a skin graft( Theraskin) several months ago. She has been visiting the wound center [facility] and was seen by Dr. Dellia Nims and Dr. Leland Johns. after prolonged conservator therapy between July 2016 and January 2017. She had been seen by the plastic surgeon and taken to the OR for debridement and application of Theraskin. She had 3 applications of Theraskin and was then treated with collagen. Prior to that she had a history of similar problems in 2014 and was treated conservatively. Had a reflux study done for the right lower extremity in August 2016 without reflux or DVT . Past medical history significant for sickle cell disease, anemia, leg ulcers, cholelithiasis,and has never been a smoker. Once the patient was discharged on the wound center she says within 2 or 3 weeks the problems recurred and she has been treating it conservatively. since I saw her 3 weeks ago at [facility] she has been unable to get her dressing material but has completed a course of doxycycline. 6/7/ 2017 -- lower extremity venous duplex reflux evaluation was done No evidence of SVT or DVT in the RLL. No venous incompetence in the RLL. No further vascular workup is indicated at this time. She was seen by Dr. [name], on 10/04/2015. She agreed with the plan of taking her to the OR for debridement and application of theraskin and would also take biopsies to rule out pyoderma gangrenosum. Follow-up note dated May 31 received and she was status post application of Theraskin to multiple ulcers around the right ankle. Pathology did not show evidence of  malignancy or pyoderma gangrenosum. She would continue to see as in the wound clinic for further care and see Dr. Leland Johns as needed. The patient brought the biopsy report and it was consistent with stasis ulcer no evidence of malignancy and the comment was that there was some adjacent neovascularization, fibrosis and patchy perivascular chronic inflammation. 11/15/2015 -- today we applied her first application of Theraskin 11/30/15; TheraSkin #2 12/13/2015 -- she is having a lot of pain locally and is here for possible  application of a theraskin today. 01/16/2016 -- the patient has significant pain and has noticed despite in spite of all local care and oral pain medication. It is impossible to debride her in the office. 02/06/2016 -- I do not see any notes from Dr. Iran Planas( the patient has not made a call to the office know as she heard from them) and the only visit to recently was with her PCP Dr. Danella Penton -- I saw her on 01/16/2016 and prescribed 90 tablets of oxycodone 10 mg and did lab work and screening for HIV. the HIV was negative and hemoglobin was 6.3 with a WBC count of 14.9 and hematocrit of 17.8 with platelets of 561. 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 The bimalleolar ulcers to the right lower extremity have been present for approximately 2 years, the left medial malleolus ulcer has been present approximately 6 months. She has followed with Dr.Thimmappa in the past and has had a total of 3 applications of Theraskin (01/2015, 09/2015, 06/17/16). She has also followed with Dr. Con Memos here in the clinic and has received 2 applications of TheraSkin (11/10/15, 11/30/15). The patient does experience chronic, and is not amenable to debridement. 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 She is not currently on any antibiotic  therapy and has not been treated with any recently. 07/17/2016 -- was seen by Dr. Iran Planas of plastic surgery who saw her 2 weeks postop application of Theraskin #3. She had removed her dressing and asked her to apply silver alginate on alternate days and follow-up back with the wound center. Future debridements and application of skin substitute would have to be done in the hospital due to her high risk for anesthesia. READMISSION 04/17/2021 Patient is now a 51 year old woman that we have had in this clinic for a prolonged period of time and 2016-2017 and then again for 2 visits in February 2018. At that point she had wounds on the right lower leg predominantly medial. She had also been seen by plastic surgery Dr. Leland Johns who I believe took her to the OR for operative debridement and application of TheraSkin in 2017. After she left our clinic she was followed for a very prolonged period of time in the wound care center in [facility] who then referred her ultimately to [facility] where she was seen by Dr. Vernona Rieger. Again taken her to the OR for skin grafting which apparently did not take. She had multiple other attempts at dressings although I have not really looked over all of these notes in great detail. She has not been seen in a wound care center in about a year. She states over the last year in addition to her right lower leg she has developed wounds on the left lower leg quite extensive. She is using Xeroform to all of these wounds without really any improvement. She also has Medicaid which does not cover wound products. The patient has had vascular work-ups in the past including most recently on 03/28/2021 showing biphasic waveforms on the right triphasic at the PTA and biphasic at the dorsalis pedis on the left. She was unable to tolerate any degree of compression to do ABIs. Unfortunately TBI's were also not done. She had venous reflux studies done in 2017. This did not show any evidence of a DVT  or SVT and no venous incompetence was noted in  the right leg at the time this was the only side with the wound As noted I did not look all over her old records. She apparently had a course of HBO and [facility] although I am not sure what the indication would have been. In any case she developed seizures and terminated treatment earlier. She is generally much more disabled than when we last saw her in clinic. She can no longer walk pretty much wheelchair-bound because predominantly of pain in the left hip. 04/24/2021; the patient tolerated the wraps we put on. We used Santyl and Hydrofera Blue under compression. I brought her back for a nurse visit for a change in dressing. With Medicaid we will have a hard time getting anything paid for and hence the need for compression. She arrives in clinic with all the wounds looking somewhat better in terms of surface 12/20; circumferential wound on the right from the lateral to the medial. She has open areas on the left medial and left lateral x2 on all of this with the same surface. This does not look completely healthy although she does have some epithelialization. She is not complaining of a lot of pain which is unusual for her sickle ulcers. I have not looked over her extensive records from [facility]. She had recent arterial studies and has a history of venous reflux studies I will need to look these over although I do not believe she has significant arterial disease 2023 05/22/2021; patient's wound areas measure slightly smaller. Still a lot of drainage coming from the right we have been using Hydrofera Blue and Santyl with some improvement in the wound surfaces. She tells me she will be getting transfused later in the week for her underlying sickle cell anemia I have looked over her recent arterial studies which were done in the fall. This was in November and showed biphasic and triphasic waveforms but she could not tolerate ABIs because of pressure and  unfortunately TBI's were not done. She has not had recent venous reflux studies that I can see 1/10; not much change about the same surface area. This has a yellowish surface to it very gritty. We have been using Santyl and Hydrofera Blue for a prolonged period. Culture I did last week showed methicillin sensitive staph aureus "rare". Our intake nurse reports greenish drainage which may be the Hydrofera Blue itself 1/17; wounds are continue to measure smaller although I am not sure about the accuracy here. Especially the areas on the right are covered in what looks to be a nonviable surface although she does have some epithelialization. Similarly she has areas on the left medial and left lateral ankle area which appear to have a better surface and perhaps are slightly smaller. We have been using Santyl and Hydrofera Blue. She cannot tolerate mechanical debridements She went for her reflux studies which showed significant reflux at the greater saphenous vein at the saphenofemoral junction as well as the greater saphenous vein in the proximal calf on the left she had reflux in the thigh and the common femoral vein and supra vein Fishel vein reflux in the greater saphenous vein. I will have vein and vascular look at this. My thoughts have been that these are likely sickle wounds. I looked through her old records from [facility] wound care center and then when she graduated to [facility] wound care center where she saw Dr. Zigmund Daniel and Dr. Vernona Rieger. Although I can see she had reflux studies done I do not see that  she actually saw a vein and vascular. I went over the fact that she had operative debridements and actual skin grafting that did not take. I do not think these wounds have ever really progressed towards healing 1/31;Substantial wounds on the right ankle area. Hyper granulated very gritty adherent debris on the surface. 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 On the left she has no DVT or SVT deep vein reflux in the common femoral vein. Superficial vein reflux in the greater saphenous vein on although the vein diameter is not really all that large. I do not think there is anything that can be done with these although I am going to send her for consultation to vein and vascular. 2/7; Wound exam; substantial wound area on the right posterior ankle area and areas on the left medial ankle and left lateral ankle. I was able to debride the left medial ankle last week fairly aggressively and it is back this week to a completely nonviable surface She will see vascular surgery this Friday and I would like them to review the venous studies and also any comments on her arterial status. If they do not see an issue here I am going to refer her to plastic surgery for an operative debridement perhaps intraoperative ACell or Integra. Eventually she will require a deep tissue culture again 2/14; substantial wound area on the right posterior ankle, medial ankle. We have been using silver alginate The patient was seen by vein and vascular she had both venous reflux studies and arterial studies. 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 She had no evidence of superficial venous thrombosis. She did have superficial vein reflux at the saphenofemoral junction and the greater saphenous vein. On the left no evidence of a DVT no evidence of superficial venous throat thrombosis she did have deep vein reflux in the common femoral vein and superficial vein reflux in the greater  saphenous vein but these were not felt to be amenable to ablation. In terms of arterial studies she had triphasic and wife biphasic wave waveforms bilaterally not felt to have a significant arterial issue. I do not get the feeling that they felt that any part of her nonhealing wounds were related to either arterial or venous issues. They did note that she had venous reflux at the right at the [facility] and G[facility]. And also on the left there were reflux in the deep system at the common femoral vein and greater saphenous vein in the proximal thigh. Nothing amenable to ablation. 2/20; she is making some decent progress on the right where there is nice skin between the 2 open areas on the right ankle. The surfaces here do not look viable yet there is some surrounding epithelialization. She still has a small area on the left medial ankle area. Hyper-granulated Jody's away always 2/28 patient has an appointment with plastic surgery on 3/8. We will see her back on 3/9. She may have to call us to get the area redressed. We've been using Santyl under silver alginate. We  made a nice improvement on the left medial ankle. The larger wounds on the right also looks somewhat better in terms of epithelialization although I think they could benefit from an aggressive debridement if plastic surgery would be willing to do that. Perhaps placement of Integra or a cell 07/26/2021: She saw Dr. Claudia Desanctis yesterday. He raised the question as to whether or not this might be pyoderma and wanted to wait until that question was answered by dermatology before proceeding with any sort of operative debridement. We have continue to use Santyl under silver alginate with Kerlix and Coban wraps. Overall, her wounds appear to be continuing to contract and epithelialize, with some granulation tissue present. There continues to be some slough on all wound surfaces. 08/09/2021: She has not been able to get an appointment with dermatology because apparently  the offices in [place] do not accept Medicaid. She is looking into whether or not she can be seen at the main [facility] dermatology clinic. This is necessary because plastic surgery is concerned that her wounds might represent pyoderma and they did not want to do any procedure until that was clarified. We have been using Santyl under silver alginate with Kerlix and Coban wraps. Today, there was a greater amount of drainage on her dressings with a slight green discoloration and significant odor. Despite this, her wounds continue to contract and epithelialize. There is pale granulation tissue present and actually, on the left medial ankle, the granulation tissue is a bit hypertrophic. 08/16/2021: Last week, I took a culture and this grew back rare methicillin-resistant Staph aureus and rare corynebacterium. The MRSA was sensitive to gentamicin which we began applying topically on an empiric basis. This week, her wounds are a bit smaller and the drainage and odor are less. Her primary care provider is working on assisting the patient with a dermatology evaluation. She has been in silver alginate over the gentamicin that was started last week along with Kerlix and Coban wraps. 08/23/2021: Because she has Medicaid, we have been unable to get her into see any dermatologist in the [place] to rule out pyoderma gangrenosum, which was a requirement from plastic surgery prior to any sort of debridement and grafting. Despite this, however, all of her wounds continue to get smaller. The wound on her left medial ankle is nearly closed. There is no odor from the wounds, although she still accumulates a modest amount of drainage on her dressings. 08/30/2021: The lateral right ankle wound and the medial left ankle wound are a bit smaller today. The medial right ankle wound is about the same size. They are less tender. We have still been unable to get her into dermatology. 09/06/2021: All of the  wounds are about the same size today. She continues to endorse minimal pain. I communicated with Dr. Claudia Desanctis in plastic surgery regarding our issues getting a dermatology appointment; he was out of town but indicated that he would look into perhaps performing the biopsy in his office and will have his office contact her. 09/14/2021: The patient has an appointment in dermatology, but it is not until October. Her wounds are roughly the same; she continues to have very thick purulent-looking drainage on her dressings. 09/20/2021: The left medial wound is nearly closed and just has a bit of accumulated eschar on the surface. The right medial and lateral ankle wounds are perhaps a little bit smaller. They continue to have a very pale surface with accumulation of thin slough. PCR culture done  last week returned with MRSA but fairly low levels. I did not think [employer] was indicated based on this. She is getting topical mupirocin with Prisma silver collagen. 10/04/2021: The patient was not seen in clinic last week due to childcare coverage issues. In the interim, the left medial leg wound has closed. The right sided leg wounds are smaller. There is more granulation tissue coming through, particularly on the lateral wound. The surface remains somewhat gritty. We have been applying topical mupirocin and Prisma silver collagen. 10/11/2021: The left medial leg wound remains closed. She does complain of some anesthetic sensation to the area. Both of the right-sided leg wounds are smaller but still have accumulated slough. Electronic Signature(s) Signed: 10/11/2021 11:35:46 AM By: Fredirick Maudlin MD FACS Entered By: Fredirick Maudlin on 10/11/2021 11:35:46 -------------------------------------------------------------------------------- Physical Exam Details Patient Name: Date of Service: [name], [name] 10/11/2021 10:15 A M Medical Record Number: [id number] Patient Account Number: [id number] Date of Birth/Sex:  Treating RN: 12-20-1970 (51 y.o. [name] Primary Care Provider: [name] Other Clinician: Referring Provider: Treating Provider/Extender: Elyse Jarvis Weeks in Treatment: 25 Constitutional . . . . No acute distress. Respiratory Normal work of breathing on room air. Notes 10/11/2021: The left medial ankle wound remains closed. Both of the right-sided wounds are a bit smaller today and both have accumulated a thin layer of slough. The surfaces remain fibrotic and gritty. Electronic Signature(s) Signed: 10/11/2021 11:43:36 AM By: Fredirick Maudlin MD FACS Entered By: Fredirick Maudlin on 10/11/2021 11:43:36 -------------------------------------------------------------------------------- Physician Orders Details Patient Name: Date of Service: [name], [name] 10/11/2021 10:15 A M Medical Record Number: [id number] Patient Account Number: [id number] Date of Birth/Sex: Treating RN: 12-01-1970 (51 y.o. [name] Primary Care Provider: [name] Other Clinician: Referring Provider: Treating Provider/Extender: Elyse Jarvis Weeks in Treatment: 25 Verbal / Phone Orders: No Diagnosis Coding ICD-10 Coding Code Description L97.818 Non-pressure chronic ulcer of other part of right lower leg with other specified severity 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 Follow-up Appointments ppointment in 1 week. - Dr. Celine Ahr - Room 3 Return A Bathing/ Shower/ Hygiene May shower with protection but do not get wound dressing(s) wet. - Can get cast protector bags at [facility] or [facility] Edema Control - Lymphedema / SCD / Other Elevate legs to the level of the heart or above for 30 minutes daily and/or when sitting, a frequency of: - throughout the day Avoid standing for long periods of time. Exercise regularly Additional Orders / Instructions Follow Nutritious Diet Wound Treatment Wound #17 - Lower Leg Wound Laterality: Right,  Lateral Cleanser: Soap and Water 1 x Per Week/30 Days Discharge Instructions: May shower and wash wound with dial antibacterial soap and water prior to dressing change. Cleanser: Wound Cleanser 1 x Per Week/30 Days Discharge Instructions: Cleanse the wound with wound cleanser prior to applying a clean dressing using gauze sponges, not tissue or cotton balls. Peri-Wound Care: Triamcinolone 15 (g) 1 x Per Week/30 Days Discharge Instructions: Use triamcinolone 15 (g) as directed Peri-Wound Care: Sween Lotion (Moisturizing lotion) 1 x Per Week/30 Days Discharge Instructions: Apply moisturizing lotion as directed Topical: Mupirocin Ointment 1 x Per Week/30 Days Discharge Instructions: Apply Mupirocin (Bactroban) as instructed Prim Dressing: Promogran Prisma Matrix, 4.34 (sq in) (silver collagen) 1 x Per Week/30 Days ary Discharge Instructions: Moisten collagen with saline or hydrogel Secondary Dressing: ABD Pad, 5x9 1 x Per Week/30 Days Discharge Instructions: Apply over primary dressing as directed. Secondary Dressing: Zetuvit Plus 4x8  in 1 x Per Week/30 Days Discharge Instructions: Apply over primary dressing as directed. Compression Wrap: Kerlix Roll 4.5x3.1 (in/yd) 1 x Per Week/30 Days Discharge Instructions: Apply Kerlix and Coban compression as directed. Compression Wrap: Coban Self-Adherent Wrap 4x5 (in/yd) 1 x Per Week/30 Days Discharge Instructions: Apply over Kerlix as directed. Wound #21 - Ankle Wound Laterality: Right, Medial Cleanser: Soap and Water 1 x Per Week/30 Days Discharge Instructions: May shower and wash wound with dial antibacterial soap and water prior to dressing change. Cleanser: Wound Cleanser 1 x Per Week/30 Days Discharge Instructions: Cleanse the wound with wound cleanser prior to applying a clean dressing using gauze sponges, not tissue or cotton balls. Peri-Wound Care: Triamcinolone 15 (g) 1 x Per Week/30 Days Discharge Instructions: Use triamcinolone 15 (g) as  directed Peri-Wound Care: Sween Lotion (Moisturizing lotion) 1 x Per Week/30 Days Discharge Instructions: Apply moisturizing lotion as directed Topical: Mupirocin Ointment 1 x Per Week/30 Days Discharge Instructions: Apply Mupirocin (Bactroban) as instructed Prim Dressing: Promogran Prisma Matrix, 4.34 (sq in) (silver collagen) 1 x Per Week/30 Days ary Discharge Instructions: Moisten collagen with saline or hydrogel Secondary Dressing: ABD Pad, 5x9 1 x Per Week/30 Days Discharge Instructions: Apply over primary dressing as directed. Secondary Dressing: Zetuvit Plus 4x8 in 1 x Per Week/30 Days Discharge Instructions: Apply over primary dressing as directed. Compression Wrap: Kerlix Roll 4.5x3.1 (in/yd) 1 x Per Week/30 Days Discharge Instructions: Apply Kerlix and Coban compression as directed. Compression Wrap: Coban Self-Adherent Wrap 4x5 (in/yd) 1 x Per Week/30 Days Discharge Instructions: Apply over Kerlix as directed. Electronic Signature(s) Signed: 10/11/2021 12:10:13 PM By: Fredirick Maudlin MD FACS Entered By: Fredirick Maudlin on 10/11/2021 11:43:56 -------------------------------------------------------------------------------- Problem List Details Patient Name: Date of Service: [name], [name] 10/11/2021 10:15 A M Medical Record Number: [id number] Patient Account Number: [id number] Date of Birth/Sex: Treating RN: 26-Jun-1970 (51 y.o. [name] Primary Care Provider: [name] Other Clinician: Referring Provider: Treating Provider/Extender: Elyse Jarvis Weeks in Treatment: 25 Active Problems ICD-10 Encounter Code Description Active Date MDM Diagnosis L97.818 Non-pressure chronic ulcer of other part of right lower leg with other specified 04/17/2021 No Yes severity 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 04/17/2021 No Yes Inactive Problems ICD-10 Code Description Active Date Inactive Date L97.828 Non-pressure chronic ulcer of other part  of left lower leg with other specified severity 04/17/2021 04/17/2021 Resolved Problems Electronic Signature(s) Signed: 10/11/2021 11:34:24 AM By: Fredirick Maudlin MD FACS Entered By: Fredirick Maudlin on 10/11/2021 11:34:24 -------------------------------------------------------------------------------- Progress Note Details Patient Name: Date of Service: [name], [name] 10/11/2021 10:15 A M Medical Record Number: [id number] Patient Account Number: [id number] Date of Birth/Sex: Treating RN: 12/29/1970 (51 y.o. [name] Primary Care Provider: [name] Other Clinician: Referring Provider: Treating Provider/Extender: Elyse Jarvis Weeks in Treatment: 25 Subjective Chief Complaint Information obtained from Patient the patient is here for evaluation of her bilateral lower extremity sickle cell ulcers 04/17/2021; patient comes in for substantial wounds on the right and left lower leg History of Present Illness (HPI) The following HPI elements were documented for the patient's wound: Location: medial and lateral ankle region on the right and left medial malleolus Quality: Patient reports experiencing a shooting pain to affected area(s). Severity: Patient states wound(s) are getting worse. Duration: right lower extremity bimalleolar ulcers have been present for approximately 2 years; the rright meedial malleolus ulcer has been there proximally 6 months Timing: Pain in wound is constant (hurts all the time) Context: The wound would  happen gradually Associated Signs and Symptoms: Patient reports having increase discharge. 51 year old patient with a history of sickle cell anemia who was last seen by me with ulceration of the right lower extremity above the ankle and was referred to Dr. Leland Johns for a surgical debridement as I was unable to do anything in the office due to excruciating pain. At that stage she was referred from the plastic surgery service to  dermatology who treated her for a skin infection with doxycycline and then Levaquin and a local antibiotic ointment. I understand the patient has since developed ulceration on the left ankle both medial and lateral and was now referred back to the wound center as dermatology has finished the management. I do not have any notes from the dermatology department Old notes: 51 year old patient with a history of sickle cell anemia, pain bilateral lower extremities, right lower extremity ulcer and has a history of receiving a skin graft( Theraskin) several months ago. She has been visiting the wound center [facility] and was seen by Dr. Dellia Nims and Dr. Leland Johns. after prolonged conservator therapy between July 2016 and January 2017. She had been seen by the plastic surgeon and taken to the OR for debridement and application of Theraskin. She had 3 applications of Theraskin and was then treated with collagen. Prior to that she had a history of similar problems in 2014 and was treated conservatively. Had a reflux study done for the right lower extremity in August 2016 without reflux or DVT . Past medical history significant for sickle cell disease, anemia, leg ulcers, cholelithiasis,and has never been a smoker. Once the patient was discharged on the wound center she says within 2 or 3 weeks the problems recurred and she has been treating it conservatively. since I saw her 3 weeks ago at [facility] she has been unable to get her dressing material but has completed a course of doxycycline. 6/7/ 2017 -- lower extremity venous duplex reflux evaluation was done oo No evidence of SVT or DVT in the RLL. No venous incompetence in the RLL. No further vascular workup is indicated at this time. She was seen by Dr. [name], on 10/04/2015. She agreed with the plan of taking her to the OR for debridement and application of theraskin and would also take biopsies to rule out pyoderma gangrenosum. Follow-up note  dated May 31 received and she was status post application of Theraskin to multiple ulcers around the right ankle. Pathology did not show evidence of malignancy or pyoderma gangrenosum. She would continue to see as in the wound clinic for further care and see Dr. Leland Johns as needed. The patient brought the biopsy report and it was consistent with stasis ulcer no evidence of malignancy and the comment was that there was some adjacent neovascularization, fibrosis and patchy perivascular chronic inflammation. 11/15/2015 -- today we applied her first application of Theraskin 11/30/15; TheraSkin #2 12/13/2015 -- she is having a lot of pain locally and is here for possible application of a theraskin today. 01/16/2016 -- the patient has significant pain and has noticed despite in spite of all local care and oral pain medication. It is impossible to debride her in the office. 02/06/2016 -- I do not see any notes from Dr. Iran Planas( the patient has not made a call to the office know as she heard from them) and the only visit to recently was with her PCP Dr. Danella Penton -- I saw her on 01/16/2016 and prescribed 90 tablets of oxycodone 10 mg and did  lab work and screening for HIV. the HIV was negative and hemoglobin was 6.3 with a WBC count of 14.9 and hematocrit of 17.8 with platelets of 561. 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 The bimalleolar ulcers to the right lower extremity have been present for approximately 2 years, the left medial malleolus ulcer has been present approximately 6 months. She has followed with Dr.Thimmappa in the past and has had a total of 3 applications of Theraskin (01/2015, 09/2015, 06/17/16). She has also followed with Dr. Con Memos here in the clinic and has received 2 applications of TheraSkin (11/10/15, 11/30/15). The patient does experience chronic, and is not  amenable to debridement. 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 She is not currently on any antibiotic therapy and has not been treated with any recently. 07/17/2016 -- was seen by Dr. Iran Planas of plastic surgery who saw her 2 weeks postop application of Theraskin #3. She had removed her dressing and asked her to apply silver alginate on alternate days and follow-up back with the wound center. Future debridements and application of skin substitute would have to be done in the hospital due to her high risk for anesthesia. READMISSION 04/17/2021 Patient is now a 51 year old woman that we have had in this clinic for a prolonged period of time and 2016-2017 and then again for 2 visits in February 2018. At that point she had wounds on the right lower leg predominantly medial. She had also been seen by plastic surgery Dr. Leland Johns who I believe took her to the OR for operative debridement and application of TheraSkin in 2017. After she left our clinic she was followed for a very prolonged period of time in the wound care center in [facility] who then referred her ultimately to [facility] where she was seen by Dr. Vernona Rieger. Again taken her to the OR for skin grafting which apparently did not take. She had multiple other attempts at dressings although I have not really looked over all of these notes in great detail. She has not been seen in a wound care center in about a year. She states over the last year in addition to her right lower leg she has developed wounds on the left lower leg quite extensive. She is using Xeroform to all of these wounds without really any improvement. She also has Medicaid which does not cover wound products. The patient has had vascular work-ups in the past including most recently on 03/28/2021 showing biphasic waveforms on the right triphasic at the PTA and biphasic at the dorsalis pedis on the left. She was unable to tolerate any degree of  compression to do ABIs. Unfortunately TBI's were also not done. She had venous reflux studies done in 2017. This did not show any evidence of a DVT or SVT and no venous incompetence was noted in the right leg at the time this was the only side with the wound As noted I did not look all over her old records. She apparently had a course of HBO and [facility] although I am not sure what the indication would have been. In any case she developed seizures and terminated treatment earlier. She is generally much more disabled than when we last saw her in clinic. She can no longer walk pretty much wheelchair-bound because predominantly of pain in the left hip. 04/24/2021; the patient tolerated the wraps  we put on. We used Santyl and Hydrofera Blue under compression. I brought her back for a nurse visit for a change in dressing. With Medicaid we will have a hard time getting anything paid for and hence the need for compression. She arrives in clinic with all the wounds looking somewhat better in terms of surface 12/20; circumferential wound on the right from the lateral to the medial. She has open areas on the left medial and left lateral x2 on all of this with the same surface. This does not look completely healthy although she does have some epithelialization. She is not complaining of a lot of pain which is unusual for her sickle ulcers. I have not looked over her extensive records from [facility]. She had recent arterial studies and has a history of venous reflux studies I will need to look these over although I do not believe she has significant arterial disease 2023 05/22/2021; patient's wound areas measure slightly smaller. Still a lot of drainage coming from the right we have been using Hydrofera Blue and Santyl with some improvement in the wound surfaces. She tells me she will be getting transfused later in the week for her underlying sickle cell anemia I have looked over her recent arterial studies which  were done in the fall. This was in November and showed biphasic and triphasic waveforms but she could not tolerate ABIs because of pressure and unfortunately TBI's were not done. She has not had recent venous reflux studies that I can see 1/10; not much change about the same surface area. This has a yellowish surface to it very gritty. We have been using Santyl and Hydrofera Blue for a prolonged period. Culture I did last week showed methicillin sensitive staph aureus "rare". Our intake nurse reports greenish drainage which may be the Hydrofera Blue itself 1/17; wounds are continue to measure smaller although I am not sure about the accuracy here. Especially the areas on the right are covered in what looks to be a nonviable surface although she does have some epithelialization. Similarly she has areas on the left medial and left lateral ankle area which appear to have a better surface and perhaps are slightly smaller. We have been using Santyl and Hydrofera Blue. She cannot tolerate mechanical debridements She went for her reflux studies which showed significant reflux at the greater saphenous vein at the saphenofemoral junction as well as the greater saphenous vein in the proximal calf on the left she had reflux in the thigh and the common femoral vein and supra vein Fishel vein reflux in the greater saphenous vein. I will have vein and vascular look at this. My thoughts have been that these are likely sickle wounds. I looked through her old records from [facility] wound care center and then when she graduated to [facility] wound care center where she saw Dr. Zigmund Daniel and Dr. Vernona Rieger. Although I can see she had reflux studies done I do not see that she actually saw a vein and vascular. I went over the fact that she had operative debridements and actual skin grafting that did not take. I do not think these wounds have ever really progressed towards healing 1/31;Substantial wounds on the right ankle  area. Hyper granulated very gritty adherent debris on the surface. 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 On the left she has no DVT or SVT deep vein reflux in the common femoral vein. Superficial vein reflux in the greater saphenous vein on although the vein diameter is not really all that large. I do not think there is anything that can be done with these although I am going to send her for consultation to vein and vascular. 2/7; Wound exam; substantial wound area on the right posterior ankle area and areas on the left medial ankle and left lateral ankle. I was able to debride the left medial ankle last week fairly aggressively and it is back this week to a completely nonviable surface She will see vascular surgery this Friday and I would like them to review the venous studies and also any comments on her arterial status. If they do not see an issue here I am going to refer her to plastic surgery for an operative debridement perhaps intraoperative ACell or Integra. Eventually she will require a deep tissue culture again 2/14; substantial wound area on the right posterior ankle, medial ankle. We have been using silver alginate The patient was seen by vein and vascular she had both venous reflux studies and arterial studies. 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 She had no evidence of superficial venous thrombosis. She did have superficial vein reflux at the saphenofemoral junction and the greater saphenous vein. On the left no evidence of a DVT no evidence of  superficial venous throat thrombosis she did have deep vein reflux in the common femoral vein and superficial vein reflux in the greater saphenous vein but these were not felt to be amenable to ablation. In terms of arterial studies she had triphasic and wife biphasic wave waveforms bilaterally not felt to have a significant arterial issue. I do not get the feeling that they felt that any part of her nonhealing wounds were related to either arterial or venous issues. They did note that she had venous reflux at the right at the [facility] and [facility]. And also on the left there were reflux in the deep system at the common femoral vein and greater saphenous vein in the proximal thigh. Nothing amenable to ablation. 2/20; she is making some decent progress on the right where there is nice skin between the 2 open areas on the right ankle. The surfaces here do not look viable yet there is some surrounding epithelialization. She still has a small area on the left medial ankle area. Hyper-granulated Jody's away always 2/28 patient has an appointment with plastic surgery on 3/8. We will see her back on 3/9. She may have to call us to get the area redressed. We've been using Santyl under silver alginate. We made a nice improvement on the left medial ankle. The larger wounds on the right also looks somewhat better in terms of epithelialization although I think they could benefit from an aggressive debridement if plastic surgery would be willing to do that. Perhaps placement of Integra or a cell 07/26/2021: She saw Dr. Claudia Desanctis yesterday. He raised the question as to whether or not this might be pyoderma and wanted to wait until that question was answered by dermatology before proceeding with any sort of operative debridement. We have continue to  use Santyl under silver alginate with Kerlix and Coban wraps. Overall, her wounds appear to be continuing to contract and epithelialize, with some granulation tissue present. There  continues to be some slough on all wound surfaces. 08/09/2021: She has not been able to get an appointment with dermatology because apparently the offices in [place] do not accept Medicaid. She is looking into whether or not she can be seen at the main [facility] dermatology clinic. This is necessary because plastic surgery is concerned that her wounds might represent pyoderma and they did not want to do any procedure until that was clarified. We have been using Santyl under silver alginate with Kerlix and Coban wraps. Today, there was a greater amount of drainage on her dressings with a slight green discoloration and significant odor. Despite this, her wounds continue to contract and epithelialize. There is pale granulation tissue present and actually, on the left medial ankle, the granulation tissue is a bit hypertrophic. 08/16/2021: Last week, I took a culture and this grew back rare methicillin-resistant Staph aureus and rare corynebacterium. The MRSA was sensitive to gentamicin which we began applying topically on an empiric basis. This week, her wounds are a bit smaller and the drainage and odor are less. Her primary care provider is working on assisting the patient with a dermatology evaluation. She has been in silver alginate over the gentamicin that was started last week along with Kerlix and Coban wraps. 08/23/2021: Because she has Medicaid, we have been unable to get her into see any dermatologist in the [place] to rule out pyoderma gangrenosum, which was a requirement from plastic surgery prior to any sort of debridement and grafting. Despite this, however, all of her wounds continue to get smaller. The wound on her left medial ankle is nearly closed. There is no odor from the wounds, although she still accumulates a modest amount of drainage on her dressings. 08/30/2021: The lateral right ankle wound and the medial left ankle wound are a bit smaller today. The medial  right ankle wound is about the same size. They are less tender. We have still been unable to get her into dermatology. 09/06/2021: All of the wounds are about the same size today. She continues to endorse minimal pain. I communicated with Dr. Claudia Desanctis in plastic surgery regarding our issues getting a dermatology appointment; he was out of town but indicated that he would look into perhaps performing the biopsy in his office and will have his office contact her. 09/14/2021: The patient has an appointment in dermatology, but it is not until October. Her wounds are roughly the same; she continues to have very thick purulent-looking drainage on her dressings. 09/20/2021: The left medial wound is nearly closed and just has a bit of accumulated eschar on the surface. The right medial and lateral ankle wounds are perhaps a little bit smaller. They continue to have a very pale surface with accumulation of thin slough. 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 I did not think [employer] was indicated based on this. She is getting topical mupirocin with Prisma silver collagen. 10/04/2021: The patient was not seen in clinic last week due to childcare coverage issues. In the interim, the left medial leg wound has closed. The right sided leg wounds are smaller. There is more granulation tissue coming through, particularly on the lateral wound. The surface remains somewhat gritty. We have been applying topical mupirocin and Prisma silver collagen. 10/11/2021: The left medial  leg wound remains closed. She does complain of some anesthetic sensation to the area. Both of the right-sided leg wounds are smaller but still have accumulated slough. Patient History Information obtained from Patient. Family History Diabetes - Mother, Lung Disease - Mother, No family history of Cancer, Heart Disease, Hereditary Spherocytosis, Hypertension, Kidney Disease, Seizures, Stroke, Thyroid Problems,  Tuberculosis. Social History Never smoker, Marital Status - Married, Alcohol Use - Never, Drug Use - No History, Caffeine Use - Daily. Medical History Eyes Denies history of Cataracts, Glaucoma, Optic Neuritis Ear/Nose/Mouth/Throat Denies history of Chronic sinus problems/congestion, Middle ear problems Hematologic/Lymphatic Patient has history of Anemia, Sickle Cell Disease Denies history of Hemophilia, Human Immunodeficiency Virus, Lymphedema Respiratory Denies history of Aspiration, Asthma, Chronic Obstructive Pulmonary Disease (COPD), Pneumothorax, Sleep Apnea, Tuberculosis Cardiovascular Denies history of Angina, Arrhythmia, Congestive Heart Failure, Coronary Artery Disease, Deep Vein Thrombosis, Hypertension, Hypotension, Myocardial Infarction, Peripheral Arterial Disease, Peripheral Venous Disease, Phlebitis, Vasculitis Gastrointestinal Denies history of Cirrhosis , Colitis, Crohnoos, Hepatitis A, Hepatitis B, Hepatitis C Endocrine Denies history of Type I Diabetes, Type II Diabetes Genitourinary Denies history of End Stage Renal Disease Immunological Denies history of Lupus Erythematosus, Raynaudoos, Scleroderma Integumentary (Skin) Denies history of History of Burn Musculoskeletal Denies history of Gout, Rheumatoid Arthritis, Osteoarthritis, Osteomyelitis Neurologic Patient has history of Neuropathy - right foot intermittant Denies history of Dementia, Quadriplegia, Paraplegia, Seizure Disorder Oncologic Denies history of Received Chemotherapy, Received Radiation Psychiatric Denies history of Anorexia/bulimia, Confinement Anxiety Hospitalization/Surgery History - c section x2. - left breast lumpectomy. - iandD right ankle with theraskin. Medical A Surgical History Notes nd Constitutional Symptoms (General Health) H/O miscarriage Cardiovascular bradycardia Gastrointestinal cholilithiasis Objective Constitutional No acute distress. Vitals Time Taken: 10:32 AM,  Height: 67 in, Temperature: 98.3 F, Pulse: 86 bpm, Respiratory Rate: 18 breaths/min, Blood Pressure: 126/67 mmHg. Respiratory Normal work of breathing on room air. General Notes: 10/11/2021: The left medial ankle wound remains closed. Both of the right-sided wounds are a bit smaller today and both have accumulated a thin layer of slough. The surfaces remain fibrotic and gritty. Integumentary (Hair, Skin) Wound #17 status is Open. Original cause of wound was Gradually Appeared. The date acquired was: 10/05/2012. The wound has been in treatment 25 weeks. The wound is located on the Right,Lateral Lower Leg. The wound measures 7.2cm length x 4.4cm width x 0.1cm depth; 24.881cm^2 area and 2.488cm^3 volume. There is Fat Layer (Subcutaneous Tissue) exposed. There is no tunneling or undermining noted. There is a medium amount of purulent drainage noted. The wound margin is flat and intact. There is small (1-33%) pink, hyper - granulation within the wound bed. There is a large (67-100%) amount of necrotic tissue within the wound bed including Adherent Slough. Wound #21 status is Open. Original cause of wound was Gradually Appeared. The date acquired was: 06/26/2021. The wound has been in treatment 15 weeks. The wound is located on the Right,Medial Ankle. The wound measures 5.7cm length x 3.2cm width x 0.1cm depth; 14.326cm^2 area and 1.433cm^3 volume. There is no tunneling or undermining noted. There is a none present amount of drainage noted. The wound margin is distinct with the outline attached to the wound base. There is medium (34-66%) granulation within the wound bed. There is a medium (34-66%) amount of necrotic tissue within the wound bed. 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 There was a Selective/Open Wound Non-Viable Tissue Debridement with a total area of 31.68 sq cm performed by Fredirick Maudlin, MD. With the following instrument(s): Curette to remove Non-Viable tissue/material. Material removed includes [facility] after achieving pain control using Lidocaine 4% Topical Solution. No specimens were taken. A time out was conducted at 11:25, prior to the start of the procedure. A Minimum amount of bleeding was controlled with Pressure. The procedure was tolerated well with a pain level of 0 throughout and a pain level of 0 following the procedure. Post Debridement Measurements: 7.2cm length x 4.4cm width x 0.1cm depth; 2.488cm^3 volume. Character of Wound/Ulcer Post Debridement is improved. Post procedure Diagnosis Wound #17: Same as Pre-Procedure Wound #21 Pre-procedure diagnosis of Wound #21 is a Sickle Cell Lesion located on the Right,Medial Ankle .Severity of Tissue Pre Debridement is: Fat layer exposed. There was a Selective/Open Wound Non-Viable Tissue Debridement with a total area of 16.53 sq cm performed by Fredirick Maudlin, MD. With the following instrument(s): Curette to remove Non-Viable tissue/material. Material removed includes [facility] after achieving pain control using Lidocaine 4% Topical Solution. No specimens were taken. A time out was conducted at 11:25, prior to the start of the procedure. A Minimum amount of bleeding was controlled with Pressure. The procedure was tolerated well with a pain level of 0 throughout and a pain level of 0 following the procedure. Post Debridement Measurements: 5.7cm length x 2.9cm width x 0.1cm depth; 1.298cm^3 volume. Character of Wound/Ulcer Post Debridement is improved. Severity of Tissue Post Debridement is: Fat layer exposed. Post procedure Diagnosis Wound #21: Same as Pre-Procedure Plan Follow-up Appointments: Return Appointment in 1 week. - Dr. Celine Ahr - Room 3 Bathing/ Shower/ Hygiene: May  shower with protection but do not get wound dressing(s) wet. - Can get cast protector bags at [facility] or [facility] Edema Control - Lymphedema / SCD / Other: Elevate legs to the level of the heart or above for 30 minutes daily and/or when sitting, a frequency of: - throughout the day Avoid standing for long periods of time. Exercise regularly Additional Orders / Instructions: Follow Nutritious Diet WOUND #17: - Lower Leg Wound Laterality: Right, Lateral Cleanser: Soap and Water 1 x Per Week/30 Days Discharge Instructions: May shower and wash wound with dial antibacterial soap and water prior to dressing change. Cleanser: Wound Cleanser 1 x Per Week/30 Days Discharge Instructions: Cleanse the wound with wound cleanser prior to applying a clean dressing using gauze sponges, not tissue or cotton balls. Peri-Wound Care: Triamcinolone 15 (g) 1 x Per Week/30 Days Discharge Instructions: Use triamcinolone 15 (g) as directed Peri-Wound Care: Sween Lotion (Moisturizing lotion) 1 x Per Week/30 Days Discharge Instructions: Apply moisturizing lotion as directed Topical: Mupirocin Ointment 1 x Per Week/30 Days Discharge Instructions: Apply Mupirocin (Bactroban) as instructed Prim Dressing: Promogran Prisma Matrix, 4.34 (sq in) (silver collagen) 1 x Per Week/30 Days ary Discharge Instructions: Moisten collagen with saline or hydrogel Secondary Dressing: ABD Pad, 5x9 1 x Per Week/30 Days Discharge Instructions: Apply over primary dressing as directed. Secondary Dressing: Zetuvit Plus 4x8 in 1 x Per Week/30 Days Discharge Instructions: Apply over primary dressing as directed. Com pression Wrap: Kerlix Roll 4.5x3.1 (in/yd) 1 x Per Week/30 Days Discharge Instructions: Apply Kerlix and Coban compression as directed. Com pression Wrap: Coban Self-Adherent Wrap 4x5 (in/yd) 1 x Per Week/30 Days Discharge Instructions: Apply over Kerlix as directed. WOUND #21: - Ankle Wound Laterality: Right, Medial Cleanser: Soap  and Water 1 x  Per Week/30 Days Discharge Instructions: May shower and wash wound with dial antibacterial soap and water prior to dressing change. Cleanser: Wound Cleanser 1 x Per Week/30 Days Discharge Instructions: Cleanse the wound with wound cleanser prior to applying a clean dressing using gauze sponges, not tissue or cotton balls. Peri-Wound Care: Triamcinolone 15 (g) 1 x Per Week/30 Days Discharge Instructions: Use triamcinolone 15 (g) as directed Peri-Wound Care: Sween Lotion (Moisturizing lotion) 1 x Per Week/30 Days Discharge Instructions: Apply moisturizing lotion as directed Topical: Mupirocin Ointment 1 x Per Week/30 Days Discharge Instructions: Apply Mupirocin (Bactroban) as instructed Prim Dressing: Promogran Prisma Matrix, 4.34 (sq in) (silver collagen) 1 x Per Week/30 Days ary Discharge Instructions: Moisten collagen with saline or hydrogel Secondary Dressing: ABD Pad, 5x9 1 x Per Week/30 Days Discharge Instructions: Apply over primary dressing as directed. Secondary Dressing: Zetuvit Plus 4x8 in 1 x Per Week/30 Days Discharge Instructions: Apply over primary dressing as directed. Com pression Wrap: Kerlix Roll 4.5x3.1 (in/yd) 1 x Per Week/30 Days Discharge Instructions: Apply Kerlix and Coban compression as directed. Com pression Wrap: Coban Self-Adherent Wrap 4x5 (in/yd) 1 x Per Week/30 Days Discharge Instructions: Apply over Kerlix as directed. 10/11/2021: The left medial ankle wound remains closed. Both of the right-sided wounds are a bit smaller today and both have accumulated a thin layer of slough. The surfaces remain fibrotic and gritty. I used a curette to debride the slough from both of her right ankle wounds. We seem to be having good results using the topical mupirocin to keep the microbial load at [place]. We will continue using this with placement silver collagen and Kerlix and Coban wraps. Follow-up in 1 week. Electronic Signature(s) Signed: 10/11/2021 11:44:40 AM  By: Fredirick Maudlin MD FACS Entered By: Fredirick Maudlin on 10/11/2021 11:44:40 -------------------------------------------------------------------------------- HxROS Details Patient Name: Date of Service: [name], [name] 10/11/2021 10:15 A M Medical Record Number: [id number] Patient Account Number: [id number] Date of Birth/Sex: Treating RN: [birth year] (51 y.o. [name] Primary Care Provider: [name] Other Clinician: Referring Provider: Treating Provider/Extender: Elyse Jarvis Weeks in Treatment: 25 Information Obtained From Patient Constitutional Symptoms (General Health) Medical History: Past Medical History Notes: H/O miscarriage Eyes Medical History: Negative for: Cataracts; Glaucoma; Optic Neuritis Ear/Nose/Mouth/Throat Medical History: Negative for: Chronic sinus problems/congestion; Middle ear problems Hematologic/Lymphatic Medical History: Positive for: Anemia; Sickle Cell Disease Negative for: Hemophilia; Human Immunodeficiency Virus; Lymphedema Respiratory Medical History: Negative for: Aspiration; Asthma; Chronic Obstructive Pulmonary Disease (COPD); Pneumothorax; Sleep Apnea; Tuberculosis Cardiovascular Medical History: Negative for: Angina; Arrhythmia; Congestive Heart Failure; Coronary Artery Disease; Deep Vein Thrombosis; Hypertension; Hypotension; Myocardial Infarction; Peripheral Arterial Disease; Peripheral Venous Disease; Phlebitis; Vasculitis Past Medical History Notes: bradycardia Gastrointestinal Medical History: Negative for: Cirrhosis ; Colitis; Crohns; Hepatitis A; Hepatitis B; Hepatitis C Past Medical History Notes: cholilithiasis Endocrine Medical History: Negative for: Type I Diabetes; Type II Diabetes Genitourinary Medical History: Negative for: End Stage Renal Disease Immunological Medical History: Negative for: Lupus Erythematosus; Raynauds; Scleroderma Integumentary (Skin) Medical  History: Negative for: History of Burn Musculoskeletal Medical History: Negative for: Gout; Rheumatoid Arthritis; Osteoarthritis; Osteomyelitis Neurologic Medical History: Positive for: Neuropathy - right foot intermittant Negative for: Dementia; Quadriplegia; Paraplegia; Seizure Disorder Oncologic Medical History: Negative for: Received Chemotherapy; Received Radiation Psychiatric Medical History: Negative for: Anorexia/bulimia; Confinement Anxiety Immunizations Pneumococcal Vaccine: Received Pneumococcal Vaccination: No Implantable Devices None Hospitalization / Surgery History Type of Hospitalization/Surgery c section x2 left breast lumpectomy iandD right ankle with theraskin Family and Social History Cancer: No; Diabetes:  Yes - Mother; Heart Disease: No; Hereditary Spherocytosis: No; Hypertension: No; Kidney Disease: No; Lung Disease: Yes - Mother; Seizures: No; Stroke: No; Thyroid Problems: No; Tuberculosis: No; Never smoker; Marital Status - Married; Alcohol Use: Never; Drug Use: No History; Caffeine Use: Daily; Financial Concerns: No; Food, Clothing or Shelter Needs: No; Support System Lacking: No; Transportation Concerns: No Engineer, maintenance) Signed: 10/11/2021 12:10:13 PM By: Fredirick Maudlin MD FACS Signed: 10/11/2021 7:01:20 PM By: Dellie Catholic RN Entered By: Fredirick Maudlin on 10/11/2021 11:42:43 -------------------------------------------------------------------------------- SuperBill Details Patient Name: Date of Service: [name], [place] [name] 10/11/2021 Medical Record Number: [id number] Patient Account Number: [id number] Date of Birth/Sex: Treating RN: September 08, 1970 (51 y.o. [name] Primary Care Provider: [name] Other Clinician: Referring Provider: Treating Provider/Extender: Elyse Jarvis Weeks in Treatment: 25 Diagnosis Coding ICD-10 Codes Code Description [phone] Non-pressure chronic ulcer of other part of  right lower leg with other specified severity 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 Facility Procedures CPT4 Code: [id number] Description: 7597 - DEBRIDE WOUND 1ST 20 SQ CM OR < ICD-10 Diagnosis Description L97.818 Non-pressure chronic ulcer of other part of right lower leg with other specified s Modifier: everity Quantity: 1 CPT4 Code: [id number] Description: 7598 - DEBRIDE WOUND EA ADDL 20 SQ CM ICD-10 Diagnosis Description L97.818 Non-pressure chronic ulcer of other part of right lower leg with other specified s Modifier: everity Quantity: 2 Physician Procedures : CPT4 Code Description Modifier [id number] - WC PHYS LEVEL 3 - EST PT 25 ICD-10 Diagnosis Description L97.818 Non-pressure chronic ulcer of other part of right lower leg with other specified severity 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 Quantity: 1 : [id number] 61950 - WC PHYS DEBR WO ANESTH 20 SQ CM ICD-10 Diagnosis Description L97.818 Non-pressure chronic ulcer of other part of right lower leg with other specified severity Quantity: 1 : [id number] - WC PHYS DEBR WO ANESTH EA ADD 20 CM ICD-10 Diagnosis Description L97.818 Non-pressure chronic ulcer of other part of right lower leg with other specified severity Quantity: 2 Electronic Signature(s) Signed: 10/11/2021 11:44:59 AM By: Fredirick Maudlin MD FACS Entered By: Fredirick Maudlin on 10/11/2021 11:44:59

## 2021-10-11 NOTE — Progress Notes (Signed)
MILLER, EDGINGTON ([id number]) Visit Report for 10/11/2021 Arrival Information Details Patient Name: Date of Service: [name] [name] 10/11/2021 10:15 A M Medical Record Number: [id number] Patient Account Number: [id number] Date of Birth/Sex: Treating RN: [birth year] (51 y.o. [name] Primary Care Khaliel Morey: [name] Other Clinician: Referring Vidit Boissonneault: Treating Nyshaun Standage/Extender: Elyse Jarvis Weeks in Treatment: 25 Visit Information History Since Last Visit Added or deleted any medications: No Patient Arrived: Wheel Chair Any new allergies or adverse reactions: No Arrival Time: 10:32 Had a fall or experienced change in No Accompanied By: alone activities of daily living that may affect Transfer Assistance: Manual risk of falls: Patient Identification Verified: Yes Signs or symptoms of abuse/neglect since last visito No Secondary Verification Process Completed: Yes Hospitalized since last visit: No Patient Requires Transmission-Based Precautions: No Implantable device outside of the clinic excluding No Patient Has Alerts: No cellular tissue based products placed in the center since last visit: Has Dressing in Place as Prescribed: Yes Has Compression in Place as Prescribed: Yes Pain Present Now: No Electronic Signature(s) Signed: 10/11/2021 5:38:25 PM By: Levan Hurst RN, BSN Entered By: Levan Hurst on 10/11/2021 10:33:04 -------------------------------------------------------------------------------- Encounter Discharge Information Details Patient Name: Date of Service: [name] URO [name], [name] 10/11/2021 10:15 A M Medical Record Number: [id number] Patient Account Number: [id number] Date of Birth/Sex: Treating RN: June 06, 1970 (51 y.o. [name] Primary Care Melainie Krinsky: [name] Other Clinician: Referring Leroy Pettway: Treating Abrian Hanover/Extender: Elyse Jarvis Weeks in Treatment: 25 Encounter Discharge Information  Items Post Procedure Vitals Discharge Condition: Stable Temperature (F): 98.3 Ambulatory Status: Wheelchair Pulse (bpm): 86 Discharge Destination: Home Respiratory Rate (breaths/min): 16 Transportation: Private Auto Blood Pressure (mmHg): 126/67 Accompanied By: self Schedule Follow-up Appointment: Yes Clinical Summary of Care: Patient Declined Electronic Signature(s) Signed: 10/11/2021 7:01:20 PM By: Dellie Catholic RN Entered By: Dellie Catholic on 10/11/2021 18:23:47 -------------------------------------------------------------------------------- Lower Extremity Assessment Details Patient Name: Date of Service: [name], [place] [name] 10/11/2021 10:15 A M Medical Record Number: [id number] Patient Account Number: [id number] Date of Birth/Sex: Treating RN: 1970-07-23 (51 y.o. [name] Primary Care Shree Espey: [name] Other Clinician: Referring Willett Lefeber: Treating Brienna Bass/Extender: Elyse Jarvis Weeks in Treatment: 25 Edema Assessment Assessed: [Left: No] [Right: No] Edema: [Left: No] [Right: Yes] Calf Left: Right: Point of Measurement: 33 cm From Medial Instep 24.5 cm 32 cm Ankle Left: Right: Point of Measurement: 10 cm From Medial Instep 16 cm 19.2 cm Vascular Assessment Pulses: Dorsalis Pedis Palpable: [Right:Yes] Electronic Signature(s) Signed: 10/11/2021 5:38:25 PM By: Levan Hurst RN, BSN Entered By: Levan Hurst on 10/11/2021 10:39:28 -------------------------------------------------------------------------------- Multi Wound Chart Details Patient Name: Date of Service: [name] URO [name], [name] 10/11/2021 10:15 A M Medical Record Number: [id number] Patient Account Number: [id number] Date of Birth/Sex: Treating RN: 01/03/1971 (51 y.o. [name] Primary Care Gauge Winski: [name] Other Clinician: Referring Sundai Probert: Treating Luann Aspinwall/Extender: Elyse Jarvis Weeks in Treatment: 25 Vital  Signs Height(in): 7 Pulse(bpm): 66 Weight(lbs): Blood Pressure(mmHg): 126/67 Body Mass Index(BMI): Temperature(F): 98.3 Respiratory Rate(breaths/min): 18 Photos: [N/A:N/A] Right, Lateral Lower Leg Right, Medial Ankle N/A Wound Location: Gradually Appeared Gradually Appeared N/A Wounding Event: Sickle Cell Lesion Sickle Cell Lesion N/A Primary Etiology: N/A Venous Leg Ulcer N/A Secondary Etiology: Anemia, Sickle Cell Disease, Anemia, Sickle Cell Disease, N/A Comorbid History: Neuropathy Neuropathy 10/05/2012 06/26/2021 N/A Date Acquired: 25 15 N/A Weeks of Treatment: Open Healed - Epithelialized N/A Wound Status: No No N/A Wound Recurrence: Yes No N/A Clustered Wound: 7.2x4.4x0.1 0x0x0 N/A Measurements  L x W x D (cm) 24.881 0 N/A A (cm) : rea 2.488 0 N/A Volume (cm) : 86.80% 100.00% N/A % Reduction in A rea: 86.80% 100.00% N/A % Reduction in Volume: Full Thickness Without Exposed Full Thickness Without Exposed N/A Classification: Support Structures Support Structures Medium None Present N/A Exudate A mount: Purulent N/A N/A Exudate Type: yellow, brown, green N/A N/A Exudate Color: Flat and Intact Distinct, outline attached N/A Wound Margin: Small (1-33%) None Present (0%) N/A Granulation A mount: Pink, Hyper-granulation N/A N/A Granulation Quality: Large (67-100%) None Present (0%) N/A Necrotic A mount: Fat Layer (Subcutaneous Tissue): Yes Fascia: No N/A Exposed Structures: Fascia: No Fat Layer (Subcutaneous Tissue): No Tendon: No Tendon: No Muscle: No Muscle: No Joint: No Joint: No Bone: No Bone: No Small (1-33%) Large (67-100%) N/A Epithelialization: Debridement - Selective/Open Wound Debridement - Selective/Open Wound N/A Debridement: Pre-procedure Verification/Time Out 11:25 11:25 N/A Taken: Lidocaine 4% Topical Solution Lidocaine 4% Topical Solution N/A Pain Control: [employer] N/A Tissue Debrided: Non-Viable Tissue Non-Viable  Tissue N/A Level: 31.68 16.53 N/A Debridement A (sq cm): rea Curette Curette N/A Instrument: Minimum Minimum N/A Bleeding: Pressure Pressure N/A Hemostasis A chieved: 0 0 N/A Procedural Pain: 0 0 N/A Post Procedural Pain: Procedure was tolerated well Procedure was tolerated well N/A Debridement Treatment Response: 7.2x4.4x0.1 5.7x2.9x0.1 N/A Post Debridement Measurements L x W x D (cm) 2.488 1.298 N/A Post Debridement Volume: (cm) Debridement Debridement N/A Procedures Performed: Treatment Notes Electronic Signature(s) Signed: 10/11/2021 11:34:30 AM By: Fredirick Maudlin MD FACS Signed: 10/11/2021 7:01:20 PM By: Dellie Catholic RN Entered By: Fredirick Maudlin on 10/11/2021 11:34:30 -------------------------------------------------------------------------------- Multi-Disciplinary Care Plan Details Patient Name: Date of Service: [name], [name] 10/11/2021 10:15 A M Medical Record Number: [id number] Patient Account Number: [id number] Date of Birth/Sex: Treating RN: 1971/04/19 (51 y.o. [name] Primary Care Farrie Sann: [name] Other Clinician: Referring Camari Quintanilla: Treating Kaleb Sek/Extender: Elyse Jarvis Weeks in Treatment: 25 Multidisciplinary Care Plan reviewed with physician Active Inactive Venous Leg Ulcer Nursing Diagnoses: Actual venous Insuffiency (use after diagnosis is confirmed) Knowledge deficit related to disease process and management Goals: Patient will maintain optimal edema control Date Initiated: 06/26/2021 Target Resolution Date: 11/16/2021 Goal Status: Active Interventions: Assess peripheral edema status every visit. Compression as ordered Treatment Activities: Therapeutic compression applied : 06/26/2021 Notes: Wound/Skin Impairment Nursing Diagnoses: Impaired tissue integrity Goals: Patient/caregiver will verbalize understanding of skin care regimen Date Initiated: 04/17/2021 Target Resolution Date:  11/16/2021 Goal Status: Active Ulcer/skin breakdown will have a volume reduction of 30% by week 4 Date Initiated: 04/17/2021 Date Inactivated: 05/29/2021 Target Resolution Date: 05/15/2021 Goal Status: Met Ulcer/skin breakdown will have a volume reduction of 50% by week 8 Date Initiated: 05/29/2021 Date Inactivated: 06/26/2021 Target Resolution Date: 06/26/2021 Goal Status: Unmet Unmet Reason: venous reflux Interventions: Assess patient/caregiver ability to obtain necessary supplies Assess patient/caregiver ability to perform ulcer/skin care regimen upon admission and as needed Assess ulceration(s) every visit Provide education on ulcer and skin care Treatment Activities: Topical wound management initiated : 04/17/2021 Notes: 06/08/21: Left leg wounds greater than 30% volume reduction, right leg acute infection. Electronic Signature(s) Signed: 10/11/2021 7:01:20 PM By: Dellie Catholic RN Entered By: Dellie Catholic on 10/11/2021 18:22:16 -------------------------------------------------------------------------------- Pain Assessment Details Patient Name: Date of Service: [name], [place] [name] 10/11/2021 10:15 A M Medical Record Number: [id number] Patient Account Number: [id number] Date of Birth/Sex: Treating RN: 04-24-1971 (51 y.o. [name] Primary Care Kaela Beitz: [name] Other Clinician: Referring Ioan Landini: Treating Lakenya Riendeau/Extender: Darrick Penna,  [place] Weeks in Treatment: 25 Active Problems Location of Pain Severity and Description of Pain Patient Has Paino No Site Locations Pain Management and Medication Current Pain Management: Electronic Signature(s) Signed: 10/11/2021 5:38:25 PM By: Levan Hurst RN, BSN Entered By: Levan Hurst on 10/11/2021 10:33:22 -------------------------------------------------------------------------------- Patient/Caregiver Education Details Patient Name: Date of Service: [name], [name] 5/25/2023andnbsp10:15 [place] Record Number: [id number] Patient Account Number: [id number] Date of Birth/Gender: Treating RN: Dec 11, 1970 (51 y.o. [name] Primary Care Physician: [name] Other Clinician: Referring Physician: Treating Physician/Extender: Elyse Jarvis Weeks in Treatment: 25 Education Assessment Education Provided To: Patient Education Topics Provided Wound/Skin Impairment: Methods: Explain/Verbal Responses: Return demonstration correctly Electronic Signature(s) Signed: 10/11/2021 7:01:20 PM By: Dellie Catholic RN Entered By: Dellie Catholic on 10/11/2021 18:22:46 -------------------------------------------------------------------------------- Wound Assessment Details Patient Name: Date of Service: [name], [place] [name] 10/11/2021 10:15 A M Medical Record Number: [id number] Patient Account Number: [id number] Date of Birth/Sex: Treating RN: September 09, 1970 (51 y.o. [name] Primary Care Nalah Macioce: [name] Other Clinician: Referring Majesta Leichter: Treating Lesle Faron/Extender: Elyse Jarvis Weeks in Treatment: 25 Wound Status Wound Number: 17 Primary Etiology: Sickle Cell Lesion Wound Location: Right, Lateral Lower Leg Wound Status: Open Wounding Event: Gradually Appeared Comorbid History: Anemia, Sickle Cell Disease, Neuropathy Date Acquired: 10/05/2012 Weeks Of Treatment: 25 Clustered Wound: Yes Photos Wound Measurements Length: (cm) 7.2 Width: (cm) 4.4 Depth: (cm) 0.1 Area: (cm) 24.881 Volume: (cm) 2.488 % Reduction in Area: 86.8% % Reduction in Volume: 86.8% Epithelialization: Small (1-33%) Tunneling: No Undermining: No Wound Description Classification: Full Thickness Without Exposed Support Structures Wound Margin: Flat and Intact Exudate Amount: Medium Exudate Type: Purulent Exudate Color: yellow, brown, green Foul Odor After Cleansing: No Slough/Fibrino Yes Wound Bed Granulation Amount: Small  (1-33%) Exposed Structure Granulation Quality: Pink, Hyper-granulation Fascia Exposed: No Necrotic Amount: Large (67-100%) Fat Layer (Subcutaneous Tissue) Exposed: Yes Necrotic Quality: Adherent Slough Tendon Exposed: No Muscle Exposed: No Joint Exposed: No Bone Exposed: No Treatment Notes Wound #17 (Lower Leg) Wound Laterality: Right, Lateral Cleanser Soap and Water Discharge Instruction: May shower and wash wound with dial antibacterial soap and water prior to dressing change. Wound Cleanser Discharge Instruction: Cleanse the wound with wound cleanser prior to applying a clean dressing using gauze sponges, not tissue or cotton balls. Peri-Wound Care Triamcinolone 15 (g) Discharge Instruction: Use triamcinolone 15 (g) as directed Sween Lotion (Moisturizing lotion) Discharge Instruction: Apply moisturizing lotion as directed Topical Mupirocin Ointment Discharge Instruction: Apply Mupirocin (Bactroban) as instructed Primary Dressing Promogran Prisma Matrix, 4.34 (sq in) (silver collagen) Discharge Instruction: Moisten collagen with saline or hydrogel Secondary Dressing ABD Pad, 5x9 Discharge Instruction: Apply over primary dressing as directed. Zetuvit Plus 4x8 in Discharge Instruction: Apply over primary dressing as directed. Secured With Compression Wrap Kerlix Roll 4.5x3.1 (in/yd) Discharge Instruction: Apply Kerlix and Coban compression as directed. Coban Self-Adherent Wrap 4x5 (in/yd) Discharge Instruction: Apply over Kerlix as directed. Compression Stockings Add-Ons Electronic Signature(s) Signed: 10/11/2021 5:38:25 PM By: Levan Hurst RN, BSN Entered By: Levan Hurst on 10/11/2021 10:41:27 -------------------------------------------------------------------------------- Wound Assessment Details Patient Name: Date of Service: [name] URO [name], [name] 10/11/2021 10:15 A M Medical Record Number: [id number] Patient Account Number: [id number] Date of Birth/Sex: Treating  RN: 04-09-1971 (51 y.o. [name] Primary Care Katura Eatherly: [name] Other Clinician: Referring Aryanna Shaver: Treating Amani Nodarse/Extender: Elyse Jarvis Weeks in Treatment: 25 Wound Status Wound Number: 21 Primary Etiology: Sickle Cell Lesion Wound Location: Right, Medial Ankle Secondary Etiology: Venous Leg Ulcer  Wounding Event: Gradually Appeared Wound Status: Open Date Acquired: 06/26/2021 Comorbid History: Anemia, Sickle Cell Disease, Neuropathy Weeks Of Treatment: 15 Clustered Wound: No Photos Wound Measurements Length: (cm) 5.7 Width: (cm) 3.2 Depth: (cm) 0.1 Area: (cm) 14.326 Volume: (cm) 1.433 % Reduction in Area: 50.6% % Reduction in Volume: 50.6% Epithelialization: Large (67-100%) Tunneling: No Undermining: No Wound Description Classification: Full Thickness Without Exposed Support Structures Wound Margin: Distinct, outline attached Exudate Amount: None Present Foul Odor After Cleansing: No Slough/Fibrino Yes Wound Bed Granulation Amount: Medium (34-66%) Exposed Structure Necrotic Amount: Medium (34-66%) Fascia Exposed: No Fat Layer (Subcutaneous Tissue) Exposed: No Tendon Exposed: No Muscle Exposed: No Joint Exposed: No Bone Exposed: No Treatment Notes Wound #21 (Ankle) Wound Laterality: Right, Medial Cleanser Soap and Water Discharge Instruction: May shower and wash wound with dial antibacterial soap and water prior to dressing change. Wound Cleanser Discharge Instruction: Cleanse the wound with wound cleanser prior to applying a clean dressing using gauze sponges, not tissue or cotton balls. Peri-Wound Care Triamcinolone 15 (g) Discharge Instruction: Use triamcinolone 15 (g) as directed Sween Lotion (Moisturizing lotion) Discharge Instruction: Apply moisturizing lotion as directed Topical Mupirocin Ointment Discharge Instruction: Apply Mupirocin (Bactroban) as instructed Primary Dressing Promogran Prisma Matrix, 4.34  (sq in) (silver collagen) Discharge Instruction: Moisten collagen with saline or hydrogel Secondary Dressing ABD Pad, 5x9 Discharge Instruction: Apply over primary dressing as directed. Zetuvit Plus 4x8 in Discharge Instruction: Apply over primary dressing as directed. Secured With Compression Wrap Kerlix Roll 4.5x3.1 (in/yd) Discharge Instruction: Apply Kerlix and Coban compression as directed. Coban Self-Adherent Wrap 4x5 (in/yd) Discharge Instruction: Apply over Kerlix as directed. Compression Stockings Add-Ons Electronic Signature(s) Signed: 10/11/2021 5:38:25 PM By: Levan Hurst RN, BSN Signed: 10/11/2021 7:01:20 PM By: Dellie Catholic RN Entered By: Dellie Catholic on 10/11/2021 11:35:57 -------------------------------------------------------------------------------- Vitals Details Patient Name: Date of Service: [name] URO [name], [name] 10/11/2021 10:15 A M Medical Record Number: [id number] Patient Account Number: [id number] Date of Birth/Sex: Treating RN: 08-10-1970 (51 y.o. [name] Primary Care Leili Eskenazi: [name] Other Clinician: Referring Kortlyn Koltz: Treating Jaydis Duchene/Extender: Elyse Jarvis Weeks in Treatment: 25 Vital Signs Time Taken: 10:32 Temperature (F): 98.3 Height (in): 67 Pulse (bpm): 86 Respiratory Rate (breaths/min): 18 Blood Pressure (mmHg): 126/67 Reference Range: 80 - 120 mg / dl Electronic Signature(s) Signed: 10/11/2021 5:38:25 PM By: Levan Hurst RN, BSN Entered By: Levan Hurst on 10/11/2021 10:33:18

## 2021-10-18 ENCOUNTER — Encounter (HOSPITAL_BASED_OUTPATIENT_CLINIC_OR_DEPARTMENT_OTHER): Payer: Medicaid Other | Attending: General Surgery | Admitting: General Surgery

## 2021-10-18 ENCOUNTER — Telehealth: Payer: Self-pay

## 2021-10-18 DIAGNOSIS — D571 Sickle-cell disease without crisis: Secondary | ICD-10-CM | POA: Diagnosis not present

## 2021-10-18 DIAGNOSIS — L97812 Non-pressure chronic ulcer of other part of right lower leg with fat layer exposed: Secondary | ICD-10-CM | POA: Diagnosis present

## 2021-10-18 NOTE — Progress Notes (Signed)
GORDON, CARLSON ([id number]) Visit Report for 10/18/2021 Arrival Information Details Patient Name: Date of Service: [name] 10/18/2021 10:15 A M Medical Record Number: [id number] Patient Account Number: [id number] Date of Birth/Sex: Treating RN: 1970-12-24 (51 y.o. [name] Primary Care Burdett Pinzon: [name] Other Clinician: Referring Charleen Madera: Treating Charlita Brian/Extender: Elyse Jarvis Weeks in Treatment: 15 Visit Information History Since Last Visit Added or deleted any medications: No Patient Arrived: Wheel Chair Any new allergies or adverse reactions: No Arrival Time: 10:06 Had a fall or experienced change in No Accompanied By: alone activities of daily living that may affect Transfer Assistance: Manual risk of falls: Patient Identification Verified: Yes Signs or symptoms of abuse/neglect since last visito No Secondary Verification Process Completed: Yes Hospitalized since last visit: No Patient Requires Transmission-Based Precautions: No Implantable device outside of the clinic excluding No Patient Has Alerts: No cellular tissue based products placed in the center since last visit: Has Dressing in Place as Prescribed: Yes Has Compression in Place as Prescribed: Yes Pain Present Now: No Electronic Signature(s) Signed: 10/18/2021 6:12:29 PM By: Levan Hurst RN, BSN Entered By: Levan Hurst on 10/18/2021 10:06:41 -------------------------------------------------------------------------------- Encounter Discharge Information Details Patient Name: Date of Service: [name], [name] 10/18/2021 10:15 A M Medical Record Number: [id number] Patient Account Number: [id number] Date of Birth/Sex: Treating RN: [birth year] (51 y.o. [name] Primary Care Amarrion Pastorino: [name] Other Clinician: Referring Zaneta Lightcap: Treating Brailon Don/Extender: Elyse Jarvis Weeks in Treatment: 26 Encounter Discharge Information Items  Post Procedure Vitals Discharge Condition: Stable Temperature (F): 98.3 Ambulatory Status: Wheelchair Pulse (bpm): 80 Discharge Destination: Home Respiratory Rate (breaths/min): 16 Transportation: Private Auto Blood Pressure (mmHg): 133/75 Accompanied By: alone Schedule Follow-up Appointment: Yes Clinical Summary of Care: Patient Declined Electronic Signature(s) Signed: 10/18/2021 6:12:29 PM By: Levan Hurst RN, BSN Entered By: Levan Hurst on 10/18/2021 18:06:58 -------------------------------------------------------------------------------- Lower Extremity Assessment Details Patient Name: Date of Service: [name], [name] 10/18/2021 10:15 A M Medical Record Number: [id number] Patient Account Number: [id number] Date of Birth/Sex: Treating RN: [birth year] (51 y.o. [name] Primary Care Andry Bogden: [name] Other Clinician: Referring Curlie Macken: Treating Andrews Tener/Extender: Elyse Jarvis Weeks in Treatment: 26 Edema Assessment Assessed: [Left: No] [Right: No] Edema: [Left: No] [Right: Yes] Calf Left: Right: Point of Measurement: 33 cm From Medial Instep 24.5 cm 32 cm Ankle Left: Right: Point of Measurement: 10 cm From Medial Instep 16 cm 19 cm Vascular Assessment Pulses: Dorsalis Pedis Palpable: [Left:Yes] [Right:Yes] Electronic Signature(s) Signed: 10/18/2021 6:12:29 PM By: Levan Hurst RN, BSN Entered By: Levan Hurst on 10/18/2021 10:11:42 -------------------------------------------------------------------------------- Multi Wound Chart Details Patient Name: Date of Service: [name], [name] 10/18/2021 10:15 A M Medical Record Number: [id number] Patient Account Number: [id number] Date of Birth/Sex: Treating RN: October 27, 1970 (51 y.o. [name] Primary Care Channah Godeaux: [name] Other Clinician: Referring Ahaana Rochette: Treating Anwita Mencer/Extender: Elyse Jarvis Weeks in Treatment: 26 Vital  Signs Height(in): 67 Pulse(bpm): 80 Weight(lbs): Blood Pressure(mmHg): 133/75 Body Mass Index(BMI): Temperature(F): 98.3 Respiratory Rate(breaths/min): 16 Photos: [N/A:N/A] Right, Lateral Lower Leg Right, Medial Ankle N/A Wound Location: Gradually Appeared Gradually Appeared N/A Wounding Event: Sickle Cell Lesion Sickle Cell Lesion N/A Primary Etiology: N/A Venous Leg Ulcer N/A Secondary Etiology: Anemia, Sickle Cell Disease, Anemia, Sickle Cell Disease, N/A Comorbid History: Neuropathy Neuropathy 10/05/2012 06/26/2021 N/A Date Acquired: 26 16 N/A Weeks of Treatment: Open Open N/A Wound Status: No No N/A Wound Recurrence: Yes No N/A Clustered Wound: 7.1x4.2x0.1 5.8x2.9x0.1 N/A Measurements  L x W x D (cm) 23.421 13.21 N/A A (cm) : rea 2.342 1.321 N/A Volume (cm) : 87.50% 54.40% N/A % Reduction in A rea: 87.50% 54.40% N/A % Reduction in Volume: Full Thickness Without Exposed Full Thickness Without Exposed N/A Classification: Support Structures Support Structures Medium Medium N/A Exudate A mount: Purulent Purulent N/A Exudate Type: yellow, brown, green yellow, brown, green N/A Exudate Color: Flat and Intact Distinct, outline attached N/A Wound Margin: Small (1-33%) Small (1-33%) N/A Granulation A mount: Pink, Hyper-granulation Pink N/A Granulation Quality: Large (67-100%) Large (67-100%) N/A Necrotic A mount: Fat Layer (Subcutaneous Tissue): Yes Fat Layer (Subcutaneous Tissue): Yes N/A Exposed Structures: Fascia: No Fascia: No Tendon: No Tendon: No Muscle: No Muscle: No Joint: No Joint: No Bone: No Bone: No Small (1-33%) Small (1-33%) N/A Epithelialization: Debridement - Selective/Open Wound Debridement - Selective/Open Wound N/A Debridement: Pre-procedure Verification/Time Out 10:22 10:22 N/A Taken: [facility] N/A Tissue Debrided: Non-Viable Tissue Non-Viable Tissue N/A Level: 29.82 16.82 N/A Debridement A (sq cm): rea Curette  Curette N/A Instrument: Minimum Minimum N/A Bleeding: Pressure Pressure N/A Hemostasis A chieved: 0 0 N/A Procedural Pain: 0 0 N/A Post Procedural Pain: Procedure was tolerated well Procedure was tolerated well N/A Debridement Treatment Response: 7.1x4.2x0.1 5.8x2.9x0.1 N/A Post Debridement Measurements L x W x D (cm) 2.342 1.321 N/A Post Debridement Volume: (cm) Debridement Debridement N/A Procedures Performed: Treatment Notes Electronic Signature(s) Signed: 10/18/2021 10:40:26 AM By: Fredirick Maudlin MD FACS Signed: 10/18/2021 6:59:01 PM By: Dellie Catholic RN Entered By: Fredirick Maudlin on 10/18/2021 10:40:26 -------------------------------------------------------------------------------- Multi-Disciplinary Care Plan Details Patient Name: Date of Service: [name], [name] 10/18/2021 10:15 A M Medical Record Number: [id number] Patient Account Number: [id number] Date of Birth/Sex: Treating RN: 01/03/1971 (51 y.o. [name] Primary Care Benedicto Capozzi: [name] Other Clinician: Referring Kamuela Magos: Treating Freddye Cardamone/Extender: Elyse Jarvis Weeks in Treatment: 26 Multidisciplinary Care Plan reviewed with physician Active Inactive Venous Leg Ulcer Nursing Diagnoses: Actual venous Insuffiency (use after diagnosis is confirmed) Knowledge deficit related to disease process and management Goals: Patient will maintain optimal edema control Date Initiated: 06/26/2021 Target Resolution Date: 11/16/2021 Goal Status: Active Interventions: Assess peripheral edema status every visit. Compression as ordered Treatment Activities: Therapeutic compression applied : 06/26/2021 Notes: Wound/Skin Impairment Nursing Diagnoses: Impaired tissue integrity Goals: Patient/caregiver will verbalize understanding of skin care regimen Date Initiated: 04/17/2021 Target Resolution Date: 11/16/2021 Goal Status: Active Ulcer/skin breakdown will have a volume reduction  of 30% by week 4 Date Initiated: 04/17/2021 Date Inactivated: 05/29/2021 Target Resolution Date: 05/15/2021 Goal Status: Met Ulcer/skin breakdown will have a volume reduction of 50% by week 8 Date Initiated: 05/29/2021 Date Inactivated: 06/26/2021 Target Resolution Date: 06/26/2021 Goal Status: Unmet Unmet Reason: venous reflux Interventions: Assess patient/caregiver ability to obtain necessary supplies Assess patient/caregiver ability to perform ulcer/skin care regimen upon admission and as needed Assess ulceration(s) every visit Provide education on ulcer and skin care Treatment Activities: Topical wound management initiated : 04/17/2021 Notes: 06/08/21: Left leg wounds greater than 30% volume reduction, right leg acute infection. Electronic Signature(s) Signed: 10/18/2021 6:12:29 PM By: Levan Hurst RN, BSN Entered By: Levan Hurst on 10/18/2021 10:16:37 -------------------------------------------------------------------------------- Pain Assessment Details Patient Name: Date of Service: [name], [name] 10/18/2021 10:15 A M Medical Record Number: [id number] Patient Account Number: [id number] Date of Birth/Sex: Treating RN: May 02, 1971 (51 y.o. [name] Primary Care Azir Muzyka: [name] Other Clinician: Referring Aeris Hersman: Treating Jiovanna Frei/Extender: Elyse Jarvis Weeks in Treatment: 26 Active Problems Location of Pain Severity  and Description of Pain Patient Has Paino No Site Locations Pain Management and Medication Current Pain Management: Electronic Signature(s) Signed: 10/18/2021 6:12:29 PM By: Levan Hurst RN, BSN Entered By: Levan Hurst on 10/18/2021 10:07:02 -------------------------------------------------------------------------------- Patient/Caregiver Education Details Patient Name: Date of Service: [name], [name] 6/1/2023andnbsp10:15 [place] Number: [id number] Patient Account Number: [id number] Date of  Birth/Gender: Treating RN: 04/30/1971 (51 y.o. [name] Primary Care Physician: [name] Other Clinician: Referring Physician: Treating Physician/Extender: Elyse Jarvis Weeks in Treatment: 26 Education Assessment Education Provided To: Patient Education Topics Provided Wound/Skin Impairment: Methods: Explain/Verbal Responses: State content correctly Electronic Signature(s) Signed: 10/18/2021 6:12:29 PM By: Levan Hurst RN, BSN Entered By: Levan Hurst on 10/18/2021 10:16:49 -------------------------------------------------------------------------------- Wound Assessment Details Patient Name: Date of Service: [name], [name] 10/18/2021 10:15 A M Medical Record Number: [id number] Patient Account Number: [id number] Date of Birth/Sex: Treating RN: [birth year] (51 y.o. [name] Primary Care Ivionna Verley: [name] Other Clinician: Referring Makila Colombe: Treating Maura Braaten/Extender: Elyse Jarvis Weeks in Treatment: 26 Wound Status Wound Number: 17 Primary Etiology: Sickle Cell Lesion Wound Location: Right, Lateral Lower Leg Wound Status: Open Wounding Event: Gradually Appeared Comorbid History: Anemia, Sickle Cell Disease, Neuropathy Date Acquired: 10/05/2012 Weeks Of Treatment: 26 Clustered Wound: Yes Photos Wound Measurements Length: (cm) 7.1 Width: (cm) 4.2 Depth: (cm) 0.1 Area: (cm) 23.421 Volume: (cm) 2.342 % Reduction in Area: 87.5% % Reduction in Volume: 87.5% Epithelialization: Small (1-33%) Tunneling: No Undermining: No Wound Description Classification: Full Thickness Without Exposed Support Structures Wound Margin: Flat and Intact Exudate Amount: Medium Exudate Type: Purulent Exudate Color: yellow, brown, green Foul Odor After Cleansing: No Slough/Fibrino Yes Wound Bed Granulation Amount: Small (1-33%) Exposed Structure Granulation Quality: Pink, Hyper-granulation Fascia Exposed:  No Necrotic Amount: Large (67-100%) Fat Layer (Subcutaneous Tissue) Exposed: Yes Necrotic Quality: Adherent Slough Tendon Exposed: No Muscle Exposed: No Joint Exposed: No Bone Exposed: No Treatment Notes Wound #17 (Lower Leg) Wound Laterality: Right, Lateral Cleanser Soap and Water Discharge Instruction: May shower and wash wound with dial antibacterial soap and water prior to dressing change. Wound Cleanser Discharge Instruction: Cleanse the wound with wound cleanser prior to applying a clean dressing using gauze sponges, not tissue or cotton balls. Peri-Wound Care Triamcinolone 15 (g) Discharge Instruction: Use triamcinolone 15 (g) as directed Sween Lotion (Moisturizing lotion) Discharge Instruction: Apply moisturizing lotion as directed Topical Mupirocin Ointment Discharge Instruction: Apply Mupirocin (Bactroban) as instructed Primary Dressing Promogran Prisma Matrix, 4.34 (sq in) (silver collagen) Discharge Instruction: Moisten collagen with saline or hydrogel Secondary Dressing ABD Pad, 5x9 Discharge Instruction: Apply over primary dressing as directed. Zetuvit Plus 4x8 in Discharge Instruction: Apply over primary dressing as directed. Secured With Compression Wrap Kerlix Roll 4.5x3.1 (in/yd) Discharge Instruction: Apply Kerlix and Coban compression as directed. Coban Self-Adherent Wrap 4x5 (in/yd) Discharge Instruction: Apply over Kerlix as directed. Compression Stockings Add-Ons Electronic Signature(s) Signed: 10/18/2021 6:12:29 PM By: Levan Hurst RN, BSN Entered By: Levan Hurst on 10/18/2021 10:14:44 -------------------------------------------------------------------------------- Wound Assessment Details Patient Name: Date of Service: [name], [name] 10/18/2021 10:15 A M Medical Record Number: [id number] Patient Account Number: [id number] Date of Birth/Sex: Treating RN: [birth year] (51 y.o. [name] Primary Care Helyne Genther: [name] Other  Clinician: Referring Trella Thurmond: Treating Rayder Sullenger/Extender: Elyse Jarvis Weeks in Treatment: 26 Wound Status Wound Number: 21 Primary Etiology: Sickle Cell Lesion Wound Location: Right, Medial Ankle Secondary Etiology: Venous Leg Ulcer Wounding Event: Gradually Appeared Wound Status: Open Date Acquired: 06/26/2021  Comorbid History: Anemia, Sickle Cell Disease, Neuropathy Weeks Of Treatment: 16 Clustered Wound: No Photos Wound Measurements Length: (cm) 5.8 Width: (cm) 2.9 Depth: (cm) 0.1 Area: (cm) 13.21 Volume: (cm) 1.321 % Reduction in Area: 54.4% % Reduction in Volume: 54.4% Epithelialization: Small (1-33%) Tunneling: No Undermining: No Wound Description Classification: Full Thickness Without Exposed Support Structures Wound Margin: Distinct, outline attached Exudate Amount: Medium Exudate Type: Purulent Exudate Color: yellow, brown, green Wound Bed Granulation Amount: Small (1-33%) Granulation Quality: Pink Necrotic Amount: Large (67-100%) Necrotic Quality: Adherent Slough Foul Odor After Cleansing: No Slough/Fibrino Yes Exposed Structure Fascia Exposed: No Fat Layer (Subcutaneous Tissue) Exposed: Yes Tendon Exposed: No Muscle Exposed: No Joint Exposed: No Bone Exposed: No Treatment Notes Wound #21 (Ankle) Wound Laterality: Right, Medial Cleanser Soap and Water Discharge Instruction: May shower and wash wound with dial antibacterial soap and water prior to dressing change. Wound Cleanser Discharge Instruction: Cleanse the wound with wound cleanser prior to applying a clean dressing using gauze sponges, not tissue or cotton balls. Peri-Wound Care Triamcinolone 15 (g) Discharge Instruction: Use triamcinolone 15 (g) as directed Sween Lotion (Moisturizing lotion) Discharge Instruction: Apply moisturizing lotion as directed Topical Mupirocin Ointment Discharge Instruction: Apply Mupirocin (Bactroban) as instructed Primary  Dressing Promogran Prisma Matrix, 4.34 (sq in) (silver collagen) Discharge Instruction: Moisten collagen with saline or hydrogel Secondary Dressing ABD Pad, 5x9 Discharge Instruction: Apply over primary dressing as directed. Zetuvit Plus 4x8 in Discharge Instruction: Apply over primary dressing as directed. Secured With Compression Wrap Kerlix Roll 4.5x3.1 (in/yd) Discharge Instruction: Apply Kerlix and Coban compression as directed. Coban Self-Adherent Wrap 4x5 (in/yd) Discharge Instruction: Apply over Kerlix as directed. Compression Stockings Add-Ons Electronic Signature(s) Signed: 10/18/2021 6:12:29 PM By: Levan Hurst RN, BSN Entered By: Levan Hurst on 10/18/2021 10:15:10 -------------------------------------------------------------------------------- Vitals Details Patient Name: Date of Service: [name], [name] 10/18/2021 10:15 A M Medical Record Number: [id number] Patient Account Number: [id number] Date of Birth/Sex: Treating RN: [birth year] (51 y.o. [name] Primary Care Aleph Nickson: [name] Other Clinician: Referring Lameka Disla: Treating Momoko Slezak/Extender: Elyse Jarvis Weeks in Treatment: 26 Vital Signs Time Taken: 10:06 Temperature (F): 98.3 Height (in): 67 Pulse (bpm): 80 Respiratory Rate (breaths/min): 16 Blood Pressure (mmHg): 133/75 Reference Range: 80 - 120 mg / dl Electronic Signature(s) Signed: 10/18/2021 6:12:29 PM By: Levan Hurst RN, BSN Entered By: Levan Hurst on 10/18/2021 10:06:55

## 2021-10-18 NOTE — Telephone Encounter (Signed)
Oxycodone  °

## 2021-10-18 NOTE — Progress Notes (Signed)
[name], [name] ([id number]) Visit Report for 10/18/2021 Chief Complaint Document Details Patient Name: Date of Service: [name] [name] 10/18/2021 10:15 A M Medical Record Number: [id number] Patient Account Number: [id number] Date of Birth/Sex: Treating RN: August 24, 1970 (51 y.o. [name] Primary Care Provider: Cammie [name] Other Clinician: Referring Provider: Treating Provider/Extender: [name] in Treatment: 26 Information Obtained from: Patient Chief Complaint the patient is here for evaluation of her bilateral lower extremity [name] cell ulcers 04/17/2021; patient comes in for substantial wounds on the right and left lower leg Electronic Signature(s) Signed: 10/18/2021 10:40:34 AM By: [name] Maudlin MD FACS Entered By: [name] on 10/18/2021 10:40:34 -------------------------------------------------------------------------------- Debridement Details Patient Name: Date of Service: [name], [name] 10/18/2021 10:15 A M Medical Record Number: [id number] Patient Account Number: [id number] Date of Birth/Sex: Treating RN: [birth year] (51 y.o. [name] Primary Care Provider: Cammie [name] Other Clinician: Referring Provider: Treating Provider/Extender: [name] in Treatment: 26 Debridement Performed for Assessment: Wound #21 Right,Medial Ankle Performed By: Physician [name] Maudlin, MD Debridement Type: Debridement Severity of Tissue Pre Debridement: Fat layer exposed Level of Consciousness (Pre-procedure): Awake and Alert Pre-procedure Verification/Time Out Yes - 10:22 Taken: Start Time: 10:22 T Area Debrided (L x W): otal 5.8 (cm) x 2.9 (cm) = 16.82 (cm) Tissue and other material debrided: Non-Viable, Slough, Biofilm, Slough Level: Non-Viable Tissue Debridement Description: Selective/Open Wound Instrument: Curette Bleeding: Minimum Hemostasis Achieved: Pressure End Time:  10:23 Procedural Pain: 0 Post Procedural Pain: 0 Response to Treatment: Procedure was tolerated well Level of Consciousness (Post- Awake and Alert procedure): Post Debridement Measurements of Total Wound Length: (cm) 5.8 Width: (cm) 2.9 Depth: (cm) 0.1 Volume: (cm) 1.321 Character of Wound/Ulcer Post Debridement: Improved Severity of Tissue Post Debridement: Fat layer exposed Post Procedure Diagnosis Same as Pre-procedure Electronic Signature(s) Signed: 10/18/2021 12:38:17 PM By: [name] Maudlin MD FACS Signed: 10/18/2021 6:12:29 PM By: [name] Hurst RN, BSN Entered By: [name] on 10/18/2021 10:23:30 -------------------------------------------------------------------------------- Debridement Details Patient Name: Date of Service: [name], [name] 10/18/2021 10:15 A M Medical Record Number: [id number] Patient Account Number: [id number] Date of Birth/Sex: Treating RN: 04-23-1971 (51 y.o. [name] Primary Care Provider: Cammie [name] Other Clinician: Referring Provider: Treating Provider/Extender: [name] in Treatment: 26 Debridement Performed for Assessment: Wound #17 Right,Lateral Lower Leg Performed By: Physician [name] Maudlin, MD Debridement Type: Debridement Level of Consciousness (Pre-procedure): Awake and Alert Pre-procedure Verification/Time Out Yes - 10:22 Taken: Start Time: 10:23 T Area Debrided (L x W): otal 7.1 (cm) x 4.2 (cm) = 29.82 (cm) Tissue and other material debrided: Non-Viable, Slough, Biofilm, Slough Level: Non-Viable Tissue Debridement Description: Selective/Open Wound Instrument: Curette Bleeding: Minimum Hemostasis Achieved: Pressure End Time: 10:24 Procedural Pain: 0 Post Procedural Pain: 0 Response to Treatment: Procedure was tolerated well Level of Consciousness (Post- Awake and Alert procedure): Post Debridement Measurements of Total Wound Length: (cm) 7.1 Width: (cm) 4.2 Depth: (cm)  0.1 Volume: (cm) 2.342 Character of Wound/Ulcer Post Debridement: Improved Post Procedure Diagnosis Same as Pre-procedure Electronic Signature(s) Signed: 10/18/2021 12:38:17 PM By: [name] Maudlin MD FACS Signed: 10/18/2021 6:12:29 PM By: [name] Hurst RN, BSN Entered By: [name] on 10/18/2021 10:24:21 -------------------------------------------------------------------------------- HPI Details Patient Name: Date of Service: [name], [name] 10/18/2021 10:15 A M Medical Record Number: [id number] Patient Account Number: [id number] Date of Birth/Sex: Treating RN: [birth year] (51 y.o. [name] Primary Care Provider: Cammie [name] Other Clinician: Referring Provider: Treating Provider/Extender: [name],  [name], [name] in Treatment: 26 History of Present Illness Location: medial and lateral ankle region on the right and left medial malleolus Quality: Patient reports experiencing a shooting pain to affected area(s). Severity: Patient states wound(s) are getting worse. Duration: right lower extremity bimalleolar ulcers have been present for approximately 2 years; the rright meedial malleolus ulcer has been there proximally 6 months Timing: Pain in wound is constant (hurts all the time) Context: The wound would happen gradually ssociated Signs and Symptoms: Patient reports having increase discharge. 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 At that stage she was referred from the plastic surgery service to dermatology who treated her for a skin infection with doxycycline and then Levaquin and a local antibiotic ointment. I understand the patient has since developed ulceration on the left ankle both medial and  lateral and was now referred back to the wound center as dermatology has finished the management. 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 She has been visiting the wound center [facility] and was seen by Dr. Dellia Nims and [name]. after prolonged conservator therapy between July 2016 and January 2017. She had been seen by the plastic surgeon and taken to the OR for debridement and application of Theraskin. She had 3 applications of Theraskin and was then treated with collagen. Prior to that she had a history of similar problems in 2014 and was treated conservatively. Had a reflux study done for the right lower extremity in August 2016 without reflux or DVT . Past medical history significant for [name] cell disease, anemia, leg ulcers, cholelithiasis,and has never been a smoker. Once the patient was discharged on the wound center she says within 2 or 3 [name] the problems recurred and she has been treating it conservatively. since I saw her 3 [name] ago at [facility] she has been unable to get her dressing material but has completed a course of doxycycline. 6/7/ 2017 -- lower extremity venous duplex reflux evaluation was done No evidence of SVT or DVT in the RLL. No venous incompetence in the RLL. No further vascular workup is indicated at this time. She was seen by Dr. [name], on 10/04/2015. She agreed with the plan of taking her to the OR for debridement and application of theraskin and would also take biopsies to rule out pyoderma gangrenosum. Follow-up note dated May 31 received and she was status post application of Theraskin to multiple ulcers around the right ankle. Pathology did not show evidence of malignancy or pyoderma gangrenosum. She would continue to see as  in the wound clinic for further care and see [name] as needed. The patient brought the biopsy report and it was consistent with stasis ulcer no evidence of malignancy and the comment was that there was some adjacent neovascularization, fibrosis and patchy perivascular chronic inflammation. 11/15/2015 -- today we applied her first application of Theraskin 11/30/15; TheraSkin #2 12/13/2015 -- she is having a lot of pain locally and is here for possible application of a theraskin today. 01/16/2016 -- the patient has  significant pain and has noticed despite in spite of all local care and oral pain medication. It is impossible to debride her in the office. 02/06/2016 -- I do not see any notes from Dr. Iran Planas( the patient has not made a call to the office know as she heard from them) and the only visit to recently was with her PCP Dr. Danella Penton -- I saw her on 01/16/2016 and prescribed 90 tablets of oxycodone 10 mg and did lab work and screening for HIV. the HIV was negative and hemoglobin was 6.3 with a WBC count of 14.9 and hematocrit of 17.8 with platelets of 561. reticulocyte count was 15.5% READMISSION: 07/10/2016- The patient is here for readmission for bilateral lower extremity ulcers in the presence of [name] cell. The bimalleolar ulcers to the right lower extremity have been present for approximately 2 years, the left medial malleolus ulcer has been present approximately 6 months. She has followed with DarleneThimmappa in the past and has had a total of 3 applications of Theraskin (01/2015, 09/2015, 06/17/16). She has also followed with Dr. Con Memos here in the clinic and has received 2 applications of TheraSkin (11/10/15, 11/30/15). The patient does experience chronic, and is not amenable to debridement. She had a [name] cell crisis in December 2017, prior to that has been several years. She is not currently on any antibiotic therapy and has not been treated with any recently. 07/17/2016 -- was  seen by Dr. Iran Planas of plastic surgery who saw her 2 [name] postop application of Theraskin #3. She had removed her dressing and asked her to apply silver alginate on alternate days and follow-up back with the wound center. Future debridements and application of skin substitute would have to be done in the hospital due to her high risk for anesthesia. READMISSION 04/17/2021 Patient is now a 51 year old woman that we have had in this clinic for a prolonged period of time and 2016-2017 and then again for 2 visits in February 2018. At that point she had wounds on the right lower leg predominantly medial. She had also been seen by plastic surgery [name] who I believe took her to the OR for operative debridement and application of TheraSkin in 2017. After she left our clinic she was followed for a very prolonged period of time in the wound care center in [facility] who then referred her ultimately to [facility] where she was seen by Dr. Vernona Rieger. Again taken her to the OR for skin grafting which apparently did not take. She had multiple other attempts at dressings although I have not really looked over all of these notes in great detail. She has not been seen in a wound care center in about a year. She states over the last year in addition to her right lower leg she has developed wounds on the left lower leg quite extensive. She is using Xeroform to all of these wounds without really any improvement. She also has Medicaid which does not cover wound products. The patient has had vascular work-ups in the past including most recently on 03/28/2021 showing biphasic waveforms on the right triphasic at the PTA and biphasic at the dorsalis pedis on the left. She was unable to tolerate any degree of compression to do ABIs. Unfortunately TBI's were also not done. She had venous reflux studies done in 2017. This did not show any evidence of a DVT or SVT and no venous incompetence was noted in the right leg at the  time this was the only  side with the wound As noted I did not look all over her old records. She apparently had a course of HBO and [facility] although I am not sure what the indication would have been. In any case she developed seizures and terminated treatment earlier. She is generally much more disabled than when we last saw her in clinic. She can no longer walk pretty much wheelchair-bound because predominantly of pain in the left hip. 04/24/2021; the patient tolerated the wraps we put on. We used Santyl and Hydrofera Blue under compression. I brought her back for a nurse visit for a change in dressing. With Medicaid we will have a hard time getting anything paid for and hence the need for compression. She arrives in clinic with all the wounds looking somewhat better in terms of surface 12/20; circumferential wound on the right from the lateral to the medial. She has open areas on the left medial and left lateral x2 on all of this with the same surface. This does not look completely healthy although she does have some epithelialization. She is not complaining of a lot of pain which is unusual for her [name] ulcers. I have not looked over her extensive records from [facility]. She had recent arterial studies and has a history of venous reflux studies I will need to look these over although I do not believe she has significant arterial disease 2023 05/22/2021; patient's wound areas measure slightly smaller. Still a lot of drainage coming from the right we have been using Hydrofera Blue and Santyl with some improvement in the wound surfaces. 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 This was in November and showed biphasic and triphasic waveforms but she could not tolerate ABIs because of pressure and unfortunately TBI's were not done. She has not had recent venous reflux  studies that I can see 1/10; not much change about the same surface area. This has a yellowish surface to it very gritty. We have been using Santyl and Hydrofera Blue for a prolonged period. Culture I did last week showed methicillin sensitive staph aureus "rare". Our intake nurse reports greenish drainage which may be the Hydrofera Blue itself 1/17; wounds are continue to measure smaller although I am not sure about the accuracy here. Especially the areas on the right are covered in what looks to be a nonviable surface although she does have some epithelialization. Similarly she has areas on the left medial and left lateral ankle area which appear to have a better surface and perhaps are slightly smaller. We have been using Santyl and Hydrofera Blue. She cannot tolerate mechanical debridements She went for her reflux studies which showed significant reflux at the greater saphenous vein at the saphenofemoral junction as well as the greater saphenous vein in the proximal calf on the left she had reflux in the thigh and the common femoral vein and supra vein Fishel vein reflux in the greater saphenous vein. I will have vein and vascular look at this. My thoughts have been that these are likely [name] wounds. I looked through her old records from [facility] wound care center and then when she graduated to [facility] wound care center where she saw Dr. Zigmund Daniel and Dr. Vernona Rieger. Although I can see she had reflux studies done I do not see that she actually saw a vein and vascular. I went over  the fact that she had operative debridements and actual skin grafting that did not take. I do not think these wounds have ever really progressed towards healing 1/31;Substantial wounds on the right ankle area. Hyper granulated very gritty adherent debris on the surface. 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 On the left she has no DVT or SVT deep vein reflux in the common femoral vein. Superficial vein reflux in the greater saphenous vein on although the vein diameter is not really all that large. I do not think there is anything that can be done with these although I am going to send her for consultation to vein and vascular. 2/7; Wound exam; substantial wound area on the right posterior ankle area and areas on the left medial ankle and left lateral ankle. I was able to debride the left medial ankle last week fairly aggressively and it is back this week to a completely nonviable surface She will see vascular surgery this Friday and I would like them to review the venous studies and also any comments on her arterial status. If they do not see an issue here I am going to refer her to plastic surgery for an operative debridement perhaps intraoperative ACell or Integra. Eventually she will require a deep tissue culture again 2/14; substantial wound area on the right posterior ankle, medial ankle. We have been using silver alginate The patient was seen by vein and vascular she had both venous reflux studies and arterial studies. 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 She had no evidence of superficial venous thrombosis. She did have superficial vein reflux at the saphenofemoral junction and the greater saphenous vein. On the left no evidence of a DVT no evidence of superficial venous throat thrombosis she did have deep vein reflux in the common femoral vein and superficial vein reflux in the greater saphenous vein but these were not felt to be amenable to  ablation. In terms of arterial studies she had triphasic and wife biphasic wave waveforms bilaterally not felt to have a significant arterial issue. I do not get the feeling that they felt that any part of her nonhealing wounds were related to either arterial or venous issues. They did note that she had venous reflux at the right at the [facility] and [facility]. And also on the left there were reflux in the deep system at the common femoral vein and greater saphenous vein in the proximal thigh. Nothing amenable to ablation. 2/20; she is making some decent progress on the right where there is nice skin between the 2 open areas on the right ankle. The surfaces here do not look viable yet there is some surrounding epithelialization. She still has a small area on the left medial ankle area. Hyper-granulated Jody's away always 2/28 patient has an appointment with plastic surgery on 3/8. We will see her back on 3/9. She may have to call us to get the area redressed. We've been using Santyl under silver alginate. We made a nice improvement on the left medial ankle. The  larger wounds on the right also looks somewhat better in terms of epithelialization although I think they could benefit from an aggressive debridement if plastic surgery would be willing to do that. Perhaps placement of Integra or a cell 07/26/2021: She saw Dr. Claudia Desanctis yesterday. He raised the question as to whether or not this might be pyoderma and wanted to wait until that question was answered by dermatology before proceeding with any sort of operative debridement. We have continue to use Santyl under silver alginate with Kerlix and Coban wraps. Overall, her wounds appear to be continuing to contract and epithelialize, with some granulation tissue present. There continues to be some slough on all wound surfaces. 08/09/2021: She has not been able to get an appointment with dermatology because apparently the offices in [place] do not  accept Medicaid. She is looking into whether or not she can be seen at the main [facility] dermatology clinic. This is necessary because plastic surgery is concerned that her wounds might represent pyoderma and they did not want to do any procedure until that was clarified. We have been using Santyl under silver alginate with Kerlix and Coban wraps. Today, there was a greater amount of drainage on her dressings with a slight green discoloration and significant odor. Despite this, her wounds continue to contract and epithelialize. There is pale granulation tissue present and actually, on the left medial ankle, the granulation tissue is a bit hypertrophic. 08/16/2021: Last week, I took a culture and this grew back rare methicillin-resistant Staph aureus and rare corynebacterium. The MRSA was sensitive to gentamicin which we began applying topically on an empiric basis. This week, her wounds are a bit smaller and the drainage and odor are less. Her primary care provider is working on assisting the patient with a dermatology evaluation. She has been in silver alginate over the gentamicin that was started last week along with Kerlix and Coban wraps. 08/23/2021: Because she has Medicaid, we have been unable to get her into see any dermatologist in the [place] to rule out pyoderma gangrenosum, which was a requirement from plastic surgery prior to any sort of debridement and grafting. Despite this, however, all of her wounds continue to get smaller. The wound on her left medial ankle is nearly closed. There is no odor from the wounds, although she still accumulates a modest amount of drainage on her dressings. 08/30/2021: The lateral right ankle wound and the medial left ankle wound are a bit smaller today. The medial right ankle wound is about the same size. They are less tender. We have still been unable to get her into dermatology. 09/06/2021: All of the wounds are about the same size today. She  continues to endorse minimal pain. I communicated with Dr. Claudia Desanctis in plastic surgery regarding our issues getting a dermatology appointment; he was out of town but indicated that he would look into perhaps performing the biopsy in his office and will have his office contact her. 09/14/2021: The patient has an appointment in dermatology, but it is not until October. Her wounds are roughly the same; she continues to have very thick purulent-looking drainage on her dressings. 09/20/2021: The left medial wound is nearly closed and just has a bit of accumulated eschar on the surface. The right medial and lateral ankle wounds are perhaps a little bit smaller. They continue to have a very pale surface with accumulation of thin slough. 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 I  did not think [employer] was indicated based on this. She is getting topical mupirocin with Prisma silver collagen. 10/04/2021: The patient was not seen in clinic last week due to childcare coverage issues. In the interim, the left medial leg wound has closed. The right sided leg wounds are smaller. There is more granulation tissue coming through, particularly on the lateral wound. The surface remains somewhat gritty. We have been applying topical mupirocin and Prisma silver collagen. 10/11/2021: The left medial leg wound remains closed. She does complain of some anesthetic sensation to the area. Both of the right-sided leg wounds are smaller but still have accumulated slough. 10/18/2021: Both right-sided leg wounds are minimally smaller this week. She still continues to accumulate slough and has thick drainage on her dressings. Electronic Signature(s) Signed: 10/18/2021 10:42:09 AM By: [name] Maudlin MD FACS Entered By: [name] on 10/18/2021 10:42:09 -------------------------------------------------------------------------------- Physical Exam Details Patient Name: Date of Service: [name], [name]  10/18/2021 10:15 A M Medical Record Number: [id number] Patient Account Number: [id number] Date of Birth/Sex: Treating RN: 03/09/1971 (51 y.o. [name] Primary Care Provider: Cammie [name] Other Clinician: Referring Provider: Treating Provider/Extender: [name] in Treatment: 26 Constitutional . . . . No acute distress. Respiratory Normal work of breathing on room air. Notes 10/18/2021: Both of the right-sided wounds are minimally smaller this week. She continues to accumulate slough on these wounds. The surfaces are still fibrous and gritty. Electronic Signature(s) Signed: 10/18/2021 10:42:54 AM By: [name] Maudlin MD FACS Entered By: [name] on 10/18/2021 10:42:53 -------------------------------------------------------------------------------- Physician Orders Details Patient Name: Date of Service: [name], [name] 10/18/2021 10:15 A M Medical Record Number: [id number] Patient Account Number: [id number] Date of Birth/Sex: Treating RN: 23-Oct-1970 (51 y.o. 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 - Dr. Celine [name] - Room 3 - Tuesday 6/6 at 10:15 Return A and Tuesday 6/13 at 10:15 Bathing/ Shower/ Hygiene May shower with protection but do not get wound dressing(s) wet. - Can get cast protector bags at [facility] or [facility] Edema Control - Lymphedema / SCD / Other Elevate legs to the level of the heart or above for 30 minutes daily and/or when sitting, a frequency of: - throughout the day Avoid standing for long periods of time. Exercise  regularly Additional Orders / Instructions Follow Nutritious Diet Wound Treatment Wound #17 - Lower Leg Wound Laterality: Right, Lateral Cleanser: Soap and Water 1 x Per Week/30 Days Discharge Instructions: May shower and wash wound with dial antibacterial soap and water prior to dressing change. Cleanser: Wound Cleanser 1 x Per Week/30 Days Discharge Instructions: Cleanse the wound with wound cleanser prior to applying a clean dressing using gauze sponges, not tissue or cotton balls. Peri-Wound Care: Triamcinolone 15 (g) 1 x Per Week/30 Days Discharge Instructions: Use triamcinolone 15 (g) as directed Peri-Wound Care: Sween Lotion (Moisturizing lotion) 1 x Per Week/30 Days Discharge Instructions: Apply moisturizing lotion as directed Topical: Mupirocin Ointment 1 x Per Week/30 Days Discharge Instructions: Apply Mupirocin (Bactroban) as instructed Prim Dressing: Promogran Prisma Matrix, 4.34 (sq in) (silver collagen) 1 x Per Week/30 Days ary Discharge Instructions: Moisten collagen with saline or hydrogel Secondary Dressing: ABD Pad, 5x9 1 x  Per Week/30 Days Discharge Instructions: Apply over primary dressing as directed. Secondary Dressing: Zetuvit Plus 4x8 in 1 x Per Week/30 Days Discharge Instructions: Apply over primary dressing as directed. Compression Wrap: Kerlix Roll 4.5x3.1 (in/yd) 1 x Per Week/30 Days Discharge Instructions: Apply Kerlix and Coban compression as directed. Compression Wrap: Coban Self-Adherent Wrap 4x5 (in/yd) 1 x Per Week/30 Days Discharge Instructions: Apply over Kerlix as directed. Wound #21 - Ankle Wound Laterality: Right, Medial Cleanser: Soap and Water 1 x Per Week/30 Days Discharge Instructions: May shower and wash wound with dial antibacterial soap and water prior to dressing change. Cleanser: Wound Cleanser 1 x Per Week/30 Days Discharge Instructions: Cleanse the wound with wound cleanser prior to applying a clean dressing using gauze sponges, not  tissue or cotton balls. Peri-Wound Care: Triamcinolone 15 (g) 1 x Per Week/30 Days Discharge Instructions: Use triamcinolone 15 (g) as directed Peri-Wound Care: Sween Lotion (Moisturizing lotion) 1 x Per Week/30 Days Discharge Instructions: Apply moisturizing lotion as directed Topical: Mupirocin Ointment 1 x Per Week/30 Days Discharge Instructions: Apply Mupirocin (Bactroban) as instructed Prim Dressing: Promogran Prisma Matrix, 4.34 (sq in) (silver collagen) 1 x Per Week/30 Days ary Discharge Instructions: Moisten collagen with saline or hydrogel Secondary Dressing: ABD Pad, 5x9 1 x Per Week/30 Days Discharge Instructions: Apply over primary dressing as directed. Secondary Dressing: Zetuvit Plus 4x8 in 1 x Per Week/30 Days Discharge Instructions: Apply over primary dressing as directed. Compression Wrap: Kerlix Roll 4.5x3.1 (in/yd) 1 x Per Week/30 Days Discharge Instructions: Apply Kerlix and Coban compression as directed. Compression Wrap: Coban Self-Adherent Wrap 4x5 (in/yd) 1 x Per Week/30 Days Discharge Instructions: Apply over Kerlix as directed. Electronic Signature(s) Signed: 10/18/2021 12:38:17 PM By: [name] Maudlin MD FACS Entered By: [name] on 10/18/2021 10:43:04 -------------------------------------------------------------------------------- Problem List Details Patient Name: Date of Service: [name], [name] 10/18/2021 10:15 A M Medical Record Number: [id number] Patient Account Number: [id number] Date of Birth/Sex: Treating RN: [birth year] (51 y.o. 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  04/17/2021 No Yes Inactive Problems ICD-10 Code Description Active Date Inactive Date L97.828 Non-pressure chronic ulcer of other part of left lower leg with other specified severity 04/17/2021 04/17/2021 Resolved Problems Electronic Signature(s) Signed: 10/18/2021 10:40:19 AM By: [name] Maudlin MD FACS Entered By: [name] on 10/18/2021 10:40:18 -------------------------------------------------------------------------------- Progress Note Details Patient Name: Date of Service: [name], [name] 10/18/2021 10:15 A M Medical Record Number: [id number] Patient Account Number: [id number] Date of Birth/Sex: Treating RN: Sep 16, 1970 (51 y.o. [name] Primary Care Provider: Cammie [name] Other Clinician: Referring Provider: Treating Provider/Extender: [name] in Treatment: 26 Subjective Chief Complaint Information obtained from Patient the patient is here for evaluation of her bilateral lower extremity [name] cell ulcers 04/17/2021; patient comes in for substantial wounds on the right and left lower leg History of Present Illness (HPI) The following HPI elements were documented for the patient's wound: Location: medial and lateral ankle region on the right and left medial malleolus Quality: Patient reports experiencing a shooting pain to affected area(s). Severity: Patient states wound(s) are getting worse. Duration: right lower extremity bimalleolar ulcers have been present for approximately 2 years; the rright meedial malleolus ulcer has been there proximally  6 months Timing: Pain in wound is constant (hurts all the time) Context: The wound would happen gradually Associated Signs and Symptoms: Patient reports having increase discharge. 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 At that stage she was referred from the plastic surgery service to dermatology who treated her for a skin infection with doxycycline and then Levaquin and a local antibiotic ointment. I understand the patient has since developed ulceration on the left ankle both medial and lateral and was now referred back to the wound center as dermatology has finished the management. 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 She has been visiting the wound center [facility] and was seen by Dr. Dellia Nims and [name]. after prolonged conservator therapy between July 2016 and January 2017. She had been seen by the plastic surgeon and taken to the OR for debridement and application of Theraskin. She had 3 applications of Theraskin and was then treated with collagen. Prior to that she had a history of similar problems in 2014 and was treated conservatively. Had a reflux study done for the right lower extremity in August 2016 without reflux or DVT . Past medical history significant for [name] cell disease, anemia, leg ulcers, cholelithiasis,and has never been a smoker. Once the patient was discharged on the wound center she says within 2 or 3 [name] the problems recurred and she has been treating it conservatively. since I saw her 3 [name] ago at [facility] she has been unable to get her dressing material but has completed a course of doxycycline. 6/7/ 2017 -- lower extremity venous duplex reflux evaluation was done oo No evidence of SVT or DVT in the RLL. No venous incompetence in the RLL. No further vascular workup is indicated at this time. She was seen by Dr. [name], on 10/04/2015. She agreed with the plan of taking her to  the OR for debridement and application of theraskin and would also take biopsies to rule out pyoderma gangrenosum. Follow-up note dated May 31 received and she was status post application of Theraskin to multiple ulcers around the right ankle. Pathology did not show evidence of malignancy or pyoderma gangrenosum. She would continue to see as in the wound clinic for further care and see [name] as needed. The patient brought the biopsy report and it was consistent with stasis ulcer no evidence of malignancy and the comment was that there was some adjacent neovascularization, fibrosis and patchy perivascular chronic inflammation. 11/15/2015 -- today we applied her first application of Theraskin 11/30/15; TheraSkin #2 12/13/2015 -- she is having a lot of pain locally and is here for possible application of a theraskin today. 01/16/2016 -- the patient has significant pain and has noticed despite in spite of all local care and oral pain medication. It is impossible to debride her in the office. 02/06/2016 -- I do not see any notes from Dr. Iran Planas( the patient has not made a call to the office know as she heard from them) and the only visit to recently was with her PCP Dr. Danella Penton --  I saw her on 01/16/2016 and prescribed 90 tablets of oxycodone 10 mg and did lab work and screening for HIV. the HIV was negative and hemoglobin was 6.3 with a WBC count of 14.9 and hematocrit of 17.8 with platelets of 561. reticulocyte count was 15.5% READMISSION: 07/10/2016- The patient is here for readmission for bilateral lower extremity ulcers in the presence of [name] cell. The bimalleolar ulcers to the right lower extremity have been present for approximately 2 years, the left medial malleolus ulcer has been present approximately 6 months. She has followed with DarleneThimmappa in the past and has had a total of 3 applications of Theraskin (01/2015, 09/2015, 06/17/16). She has also followed with Dr. Con Memos here  in the clinic and has received 2 applications of TheraSkin (11/10/15, 11/30/15). The patient does experience chronic, and is not amenable to debridement. She had a [name] cell crisis in December 2017, prior to that has been several years. She is not currently on any antibiotic therapy and has not been treated with any recently. 07/17/2016 -- was seen by Dr. Iran Planas of plastic surgery who saw her 2 [name] postop application of Theraskin #3. She had removed her dressing and asked her to apply silver alginate on alternate days and follow-up back with the wound center. Future debridements and application of skin substitute would have to be done in the hospital due to her high risk for anesthesia. READMISSION 04/17/2021 Patient is now a 51 year old woman that we have had in this clinic for a prolonged period of time and 2016-2017 and then again for 2 visits in February 2018. At that point she had wounds on the right lower leg predominantly medial. She had also been seen by plastic surgery [name] who I believe took her to the OR for operative debridement and application of TheraSkin in 2017. After she left our clinic she was followed for a very prolonged period of time in the wound care center in [facility] who then referred her ultimately to [facility] where she was seen by Dr. Vernona Rieger. Again taken her to the OR for skin grafting which apparently did not take. She had multiple other attempts at dressings although I have not really looked over all of these notes in great detail. She has not been seen in a wound care center in about a year. She states over the last year in addition to her right lower leg she has developed wounds on the left lower leg quite extensive. She is using Xeroform to all of these wounds without really any improvement. She also has Medicaid which does not cover wound products. The patient has had vascular work-ups in the past including most recently on 03/28/2021 showing biphasic  waveforms on the right triphasic at the PTA and biphasic at the dorsalis pedis on the left. She was unable to tolerate any degree of compression to do ABIs. Unfortunately TBI's were also not done. She had venous reflux studies done in 2017. This did not show any evidence of a DVT or SVT and no venous incompetence was noted in the right leg at the time this was the only side with the wound As noted I did not look all over her old records. She apparently had a course of HBO and [facility] although I am not sure what the indication would have been. In any case she developed seizures and terminated treatment earlier. She is generally much more disabled than when we last saw her in clinic. She can no longer walk pretty much  wheelchair-bound because predominantly of pain in the left hip. 04/24/2021; the patient tolerated the wraps we put on. We used Santyl and Hydrofera Blue under compression. I brought her back for a nurse visit for a change in dressing. With Medicaid we will have a hard time getting anything paid for and hence the need for compression. She arrives in clinic with all the wounds looking somewhat better in terms of surface 12/20; circumferential wound on the right from the lateral to the medial. She has open areas on the left medial and left lateral x2 on all of this with the same surface. This does not look completely healthy although she does have some epithelialization. She is not complaining of a lot of pain which is unusual for her [name] ulcers. I have not looked over her extensive records from [facility]. She had recent arterial studies and has a history of venous reflux studies I will need to look these over although I do not believe she has significant arterial disease 2023 05/22/2021; patient's wound areas measure slightly smaller. Still a lot of drainage coming from the right we have been using Hydrofera Blue and Santyl with some improvement in the wound surfaces. She tells me she will  be getting transfused later in the week for her underlying [name] cell anemia I have looked over her recent arterial studies which were done in the fall. This was in November and showed biphasic and triphasic waveforms but she could not tolerate ABIs because of pressure and unfortunately TBI's were not done. She has not had recent venous reflux studies that I can see 1/10; not much change about the same surface area. This has a yellowish surface to it very gritty. We have been using Santyl and Hydrofera Blue for a prolonged period. Culture I did last week showed methicillin sensitive staph aureus "rare". Our intake nurse reports greenish drainage which may be the Hydrofera Blue itself 1/17; wounds are continue to measure smaller although I am not sure about the accuracy here. Especially the areas on the right are covered in what looks to be a nonviable surface although she does have some epithelialization. Similarly she has areas on the left medial and left lateral ankle area which appear to have a better surface and perhaps are slightly smaller. We have been using Santyl and Hydrofera Blue. She cannot tolerate mechanical debridements She went for her reflux studies which showed significant reflux at the greater saphenous vein at the saphenofemoral junction as well as the greater saphenous vein in the proximal calf on the left she had reflux in the thigh and the common femoral vein and supra vein Fishel vein reflux in the greater saphenous vein. I will have vein and vascular look at this. My thoughts have been that these are likely [name] wounds. I looked through her old records from [facility] wound care center and then when she graduated to [facility] wound care center where she saw Dr. Zigmund Daniel and Dr. Vernona Rieger. Although I can see she had reflux studies done I do not see that she actually saw a vein and vascular. I went over the fact that she had operative debridements and actual skin grafting  that did not take. I do not think these wounds have ever really progressed towards healing 1/31;Substantial wounds on the right ankle area. Hyper granulated very gritty adherent debris on the surface. 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 On the left she has no DVT or SVT deep vein reflux in the common femoral vein. Superficial vein reflux in the greater saphenous vein on although the vein diameter is not really all that large. I do not think there is anything that can be done with these although I am going to send her for consultation to vein and vascular. 2/7; Wound exam; substantial wound area on the right posterior ankle area and areas on the left medial ankle and left lateral ankle. I was able to debride the left medial ankle last week fairly aggressively and it is back this week to a completely nonviable surface She will see vascular surgery this Friday and I would like them to review the venous studies and also any comments on her arterial status. If they do not see an issue here I am going to refer her to plastic surgery for an operative debridement perhaps intraoperative ACell or Integra. Eventually she will require a deep tissue culture again 2/14; substantial wound area on the right posterior ankle, medial ankle. We have been using silver alginate The patient was seen by vein and vascular she had both venous reflux studies and arterial studies. 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 She had no evidence of superficial venous thrombosis. She did have  superficial vein reflux at the saphenofemoral junction and the greater saphenous vein. On the left no evidence of a DVT no evidence of superficial venous throat thrombosis she did have deep vein reflux in the common femoral vein and superficial vein reflux in the greater saphenous vein but these were not felt to be amenable to ablation. In terms of arterial studies she had triphasic and wife biphasic wave waveforms bilaterally not felt to have a significant arterial issue. I do not get the feeling that they felt that any part of her nonhealing wounds were related to either arterial or venous issues. They did note that she had venous reflux at the right at the [facility] and [facility]. And also on the left there were reflux in the deep system at the common femoral vein and greater saphenous vein in the proximal thigh. Nothing amenable to ablation. 2/20; she is making some decent progress on the right where there is nice skin between the 2 open areas on the right ankle. The surfaces here do not look viable yet there is some surrounding epithelialization. She still has a small area on the left medial ankle area. Hyper-granulated Jody's away always 2/28 patient has an appointment with plastic surgery on 3/8. We will see her back on 3/9. She may have to call us to get the area redressed. We've been using Santyl under silver alginate. We made a nice improvement on the left medial ankle. The larger wounds on the right also looks somewhat better in terms of epithelialization although I think they could benefit from an aggressive debridement if plastic surgery would be willing to do that. Perhaps placement of Integra or a cell 07/26/2021: She saw Dr. Claudia Desanctis yesterday. He raised the question as to whether or not this might be pyoderma and wanted to wait until that question was  answered by dermatology before proceeding with any sort of operative debridement. We have continue to use Santyl under silver alginate with Kerlix and  Coban wraps. Overall, her wounds appear to be continuing to contract and epithelialize, with some granulation tissue present. There continues to be some slough on all wound surfaces. 08/09/2021: She has not been able to get an appointment with dermatology because apparently the offices in [place] do not accept Medicaid. She is looking into whether or not she can be seen at the main [facility] dermatology clinic. This is necessary because plastic surgery is concerned that her wounds might represent pyoderma and they did not want to do any procedure until that was clarified. We have been using Santyl under silver alginate with Kerlix and Coban wraps. Today, there was a greater amount of drainage on her dressings with a slight green discoloration and significant odor. Despite this, her wounds continue to contract and epithelialize. There is pale granulation tissue present and actually, on the left medial ankle, the granulation tissue is a bit hypertrophic. 08/16/2021: Last week, I took a culture and this grew back rare methicillin-resistant Staph aureus and rare corynebacterium. The MRSA was sensitive to gentamicin which we began applying topically on an empiric basis. This week, her wounds are a bit smaller and the drainage and odor are less. Her primary care provider is working on assisting the patient with a dermatology evaluation. She has been in silver alginate over the gentamicin that was started last week along with Kerlix and Coban wraps. 08/23/2021: Because she has Medicaid, we have been unable to get her into see any dermatologist in the [place] to rule out pyoderma gangrenosum, which was a requirement from plastic surgery prior to any sort of debridement and grafting. Despite this, however, all of her wounds continue to get smaller. The wound on her left medial ankle is nearly closed. There is no odor from the wounds, although she still accumulates a modest amount of  drainage on her dressings. 08/30/2021: The lateral right ankle wound and the medial left ankle wound are a bit smaller today. The medial right ankle wound is about the same size. They are less tender. We have still been unable to get her into dermatology. 09/06/2021: All of the wounds are about the same size today. She continues to endorse minimal pain. I communicated with Dr. Claudia Desanctis in plastic surgery regarding our issues getting a dermatology appointment; he was out of town but indicated that he would look into perhaps performing the biopsy in his office and will have his office contact her. 09/14/2021: The patient has an appointment in dermatology, but it is not until October. Her wounds are roughly the same; she continues to have very thick purulent-looking drainage on her dressings. 09/20/2021: The left medial wound is nearly closed and just has a bit of accumulated eschar on the surface. The right medial and lateral ankle wounds are perhaps a little bit smaller. They continue to have a very pale surface with accumulation of thin slough. 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 I did not think [employer] was indicated based on this. She is getting topical mupirocin with Prisma silver collagen. 10/04/2021: The patient was not seen in clinic last week due to childcare coverage issues. In the interim, the left medial leg wound has closed. The right sided leg wounds are smaller. There is more granulation tissue coming through, particularly on the lateral wound. The surface remains somewhat  gritty. We have been applying topical mupirocin and Prisma silver collagen. 10/11/2021: The left medial leg wound remains closed. She does complain of some anesthetic sensation to the area. Both of the right-sided leg wounds are smaller but still have accumulated slough. 10/18/2021: Both right-sided leg wounds are minimally smaller this week. She still continues to accumulate slough and has thick  drainage on her dressings. Patient History Information obtained from Patient. Family History Diabetes - Mother, Lung Disease - Mother, No family history of Cancer, Heart Disease, Hereditary Spherocytosis, Hypertension, Kidney Disease, Seizures, Stroke, Thyroid Problems, Tuberculosis. Social History Never smoker, Marital Status - Married, Alcohol Use - Never, Drug Use - No History, Caffeine Use - Daily. Medical History Eyes Denies history of Cataracts, Glaucoma, Optic Neuritis Ear/Nose/Mouth/Throat Denies history of Chronic sinus problems/congestion, Middle ear problems Hematologic/Lymphatic Patient has history of Anemia, [name] Cell Disease Denies history of Hemophilia, Human Immunodeficiency Virus, Lymphedema Respiratory Denies history of Aspiration, Asthma, Chronic Obstructive Pulmonary Disease (COPD), Pneumothorax, Sleep Apnea, Tuberculosis Cardiovascular Denies history of Angina, Arrhythmia, Congestive Heart Failure, Coronary Artery Disease, Deep Vein Thrombosis, Hypertension, Hypotension, Myocardial Infarction, Peripheral Arterial Disease, Peripheral Venous Disease, Phlebitis, Vasculitis Gastrointestinal Denies history of Cirrhosis , Colitis, Crohnoos, Hepatitis A, Hepatitis B, Hepatitis C Endocrine Denies history of Type I Diabetes, Type II Diabetes Genitourinary Denies history of End Stage Renal Disease Immunological Denies history of Lupus Erythematosus, Raynaudoos, Scleroderma Integumentary (Skin) Denies history of History of Burn Musculoskeletal Denies history of Gout, Rheumatoid Arthritis, Osteoarthritis, Osteomyelitis Neurologic Patient has history of Neuropathy - right foot intermittant Denies history of Dementia, Quadriplegia, Paraplegia, Seizure Disorder Oncologic Denies history of Received Chemotherapy, Received Radiation Psychiatric Denies history of Anorexia/bulimia, Confinement Anxiety Hospitalization/Surgery History - c section x2. - left breast  lumpectomy. - iandD right ankle with theraskin. Medical A Surgical History Notes nd Constitutional Symptoms (General Health) H/O miscarriage Cardiovascular bradycardia Gastrointestinal cholilithiasis Objective Constitutional No acute distress. Vitals Time Taken: 10:06 AM, Height: 67 in, Temperature: 98.3 F, Pulse: 80 bpm, Respiratory Rate: 16 breaths/min, Blood Pressure: 133/75 mmHg. Respiratory Normal work of breathing on room air. General Notes: 10/18/2021: Both of the right-sided wounds are minimally smaller this week. She continues to accumulate slough on these wounds. The surfaces are still fibrous and gritty. Integumentary (Hair, Skin) Wound #17 status is Open. Original cause of wound was Gradually Appeared. The date acquired was: 10/05/2012. The wound has been in treatment 26 [name]. The wound is located on the Right,Lateral Lower Leg. The wound measures 7.1cm length x 4.2cm width x 0.1cm depth; 23.421cm^2 area and 2.342cm^3 volume. There is Fat Layer (Subcutaneous Tissue) exposed. There is no tunneling or undermining noted. There is a medium amount of purulent drainage noted. The wound margin is flat and intact. There is small (1-33%) pink, hyper - granulation within the wound bed. There is a large (67-100%) amount of necrotic tissue within the wound bed including Adherent Slough. Wound #21 status is Open. Original cause of wound was Gradually Appeared. The date acquired was: 06/26/2021. The wound has been in treatment 16 [name]. The wound is located on the Right,Medial Ankle. The wound measures 5.8cm length x 2.9cm width x 0.1cm depth; 13.21cm^2 area and 1.321cm^3 volume. There is Fat Layer (Subcutaneous Tissue) exposed. There is no tunneling or undermining noted. There is a medium amount of purulent drainage noted. The wound margin is distinct with the outline attached to the wound base. There is small (1-33%) pink granulation within the wound bed. There is a large (67-100%) amount of  necrotic tissue  within the wound bed including Adherent Slough. 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 There was a Selective/Open Wound Non-Viable Tissue Debridement with a total area of 29.82 sq cm performed by [name] Maudlin, MD. With the following instrument(s): Curette to remove Non-Viable tissue/material. Material removed includes Slough and Biofilm and. No specimens were taken. A time out was conducted at 10:22, prior to the start of the procedure. A Minimum amount of bleeding was controlled with Pressure. The procedure was tolerated well with a pain level of 0 throughout and a pain level of 0 following the procedure. Post Debridement Measurements: 7.1cm length x 4.2cm width x 0.1cm depth; 2.342cm^3 volume. Character of Wound/Ulcer Post Debridement is improved. Post procedure Diagnosis Wound #17: Same as Pre-Procedure Wound #21 Pre-procedure diagnosis of Wound #21 is a [name] Cell Lesion located on the Right,Medial Ankle .Severity of Tissue Pre Debridement is: Fat layer exposed. There was a Selective/Open Wound Non-Viable Tissue Debridement with a total area of 16.82 sq cm performed by [name] Maudlin, MD. With the following instrument(s): Curette to remove Non-Viable tissue/material. Material removed includes Slough and Biofilm and. No specimens were taken. A time out was conducted at 10:22, prior to the start of the procedure. A Minimum amount of bleeding was controlled with Pressure. The procedure was tolerated well with a pain level of 0 throughout and a pain level of 0 following the procedure. Post Debridement Measurements: 5.8cm length x 2.9cm width x 0.1cm depth; 1.321cm^3 volume. Character of Wound/Ulcer Post Debridement is improved.  Severity of Tissue Post Debridement is: Fat layer exposed. Post procedure Diagnosis Wound #21: Same as Pre-Procedure Plan Follow-up Appointments: Return Appointment in 1 week. - Dr. Celine [name] - Room 3 - Tuesday 6/6 at 10:15 and Tuesday 6/13 at 10:15 Bathing/ Shower/ Hygiene: May shower with protection but do not get wound dressing(s) wet. - Can get cast protector bags at [facility] or [facility] Edema Control - Lymphedema / SCD / Other: Elevate legs to the level of the heart or above for 30 minutes daily and/or when sitting, a frequency of: - throughout the day Avoid standing for long periods of time. Exercise regularly Additional Orders / Instructions: Follow Nutritious Diet WOUND #17: - Lower Leg Wound Laterality: Right, Lateral Cleanser: Soap and Water 1 x Per Week/30 Days Discharge Instructions: May shower and wash wound with dial antibacterial soap and water prior to dressing change. Cleanser: Wound Cleanser 1 x Per Week/30 Days Discharge Instructions: Cleanse the wound with wound cleanser prior to applying a clean dressing using gauze sponges, not tissue or cotton balls. Peri-Wound Care: Triamcinolone 15 (g) 1 x Per Week/30 Days Discharge Instructions: Use triamcinolone 15 (g) as directed Peri-Wound Care: Sween Lotion (Moisturizing lotion) 1 x Per Week/30 Days Discharge Instructions: Apply moisturizing lotion as directed Topical: Mupirocin Ointment 1 x Per Week/30 Days Discharge Instructions: Apply Mupirocin (Bactroban) as instructed Prim Dressing: Promogran Prisma Matrix, 4.34 (sq in) (silver collagen) 1 x Per Week/30 Days ary Discharge Instructions: Moisten collagen with saline or hydrogel Secondary Dressing: ABD Pad, 5x9 1 x Per Week/30 Days Discharge Instructions: Apply over primary dressing as directed. Secondary Dressing: Zetuvit Plus 4x8 in 1 x Per Week/30 Days Discharge Instructions: Apply over primary dressing as directed. Com pression Wrap: Kerlix Roll 4.5x3.1 (in/yd) 1 x Per  Week/30 Days Discharge Instructions: Apply  Kerlix and Coban compression as directed. Com pression Wrap: Coban Self-Adherent Wrap 4x5 (in/yd) 1 x Per Week/30 Days Discharge Instructions: Apply over Kerlix as directed. WOUND #21: - Ankle Wound Laterality: Right, Medial Cleanser: Soap and Water 1 x Per Week/30 Days Discharge Instructions: May shower and wash wound with dial antibacterial soap and water prior to dressing change. Cleanser: Wound Cleanser 1 x Per Week/30 Days Discharge Instructions: Cleanse the wound with wound cleanser prior to applying a clean dressing using gauze sponges, not tissue or cotton balls. Peri-Wound Care: Triamcinolone 15 (g) 1 x Per Week/30 Days Discharge Instructions: Use triamcinolone 15 (g) as directed Peri-Wound Care: Sween Lotion (Moisturizing lotion) 1 x Per Week/30 Days Discharge Instructions: Apply moisturizing lotion as directed Topical: Mupirocin Ointment 1 x Per Week/30 Days Discharge Instructions: Apply Mupirocin (Bactroban) as instructed Prim Dressing: Promogran Prisma Matrix, 4.34 (sq in) (silver collagen) 1 x Per Week/30 Days ary Discharge Instructions: Moisten collagen with saline or hydrogel Secondary Dressing: ABD Pad, 5x9 1 x Per Week/30 Days Discharge Instructions: Apply over primary dressing as directed. Secondary Dressing: Zetuvit Plus 4x8 in 1 x Per Week/30 Days Discharge Instructions: Apply over primary dressing as directed. Com pression Wrap: Kerlix Roll 4.5x3.1 (in/yd) 1 x Per Week/30 Days Discharge Instructions: Apply Kerlix and Coban compression as directed. Com pression Wrap: Coban Self-Adherent Wrap 4x5 (in/yd) 1 x Per Week/30 Days Discharge Instructions: Apply over Kerlix as directed. 10/18/2021: Both of the right-sided wounds are minimally smaller this week. She continues to accumulate slough on these wounds. The surfaces are still fibrous and gritty. I used a curette to debride the slough from both of the wounds. We will continue  using topical mupirocin with Prisma silver collagen and Kerlix/Coban wrapping. Follow-up in 1 week. Electronic Signature(s) Signed: 10/18/2021 10:43:39 AM By: [name] Maudlin MD FACS Entered By: [name] on 10/18/2021 10:43:39 -------------------------------------------------------------------------------- HxROS Details Patient Name: Date of Service: [name], [name] 10/18/2021 10:15 A M Medical Record Number: [id number] Patient Account Number: [id number] Date of Birth/Sex: Treating RN: 07/02/1970 (51 y.o. [name] Primary Care Provider: Cammie [name] Other Clinician: Referring Provider: Treating Provider/Extender: [name] in Treatment: 26 Information Obtained From Patient Constitutional Symptoms (General Health) Medical History: Past Medical History Notes: H/O miscarriage Eyes Medical History: Negative for: Cataracts; Glaucoma; Optic Neuritis Ear/Nose/Mouth/Throat Medical History: Negative for: Chronic sinus problems/congestion; Middle ear problems Hematologic/Lymphatic Medical History: Positive for: Anemia; [name] Cell Disease Negative for: Hemophilia; Human Immunodeficiency Virus; Lymphedema Respiratory Medical History: Negative for: Aspiration; Asthma; Chronic Obstructive Pulmonary Disease (COPD); Pneumothorax; Sleep Apnea; Tuberculosis Cardiovascular Medical History: Negative for: Angina; Arrhythmia; Congestive Heart Failure; Coronary Artery Disease; Deep Vein Thrombosis; Hypertension; Hypotension; Myocardial Infarction; Peripheral Arterial Disease; Peripheral Venous Disease; Phlebitis; Vasculitis Past Medical History Notes: bradycardia Gastrointestinal Medical History: Negative for: Cirrhosis ; Colitis; Crohns; Hepatitis A; Hepatitis B; Hepatitis C Past Medical History Notes: cholilithiasis Endocrine Medical History: Negative for: Type I Diabetes; Type II Diabetes Genitourinary Medical History: Negative for:  End Stage Renal Disease Immunological Medical History: Negative for: Lupus Erythematosus; Raynauds; Scleroderma Integumentary (Skin) Medical History: Negative for: History of Burn Musculoskeletal Medical History: Negative for: Gout; Rheumatoid Arthritis; Osteoarthritis; Osteomyelitis Neurologic Medical History: Positive for: Neuropathy - right foot intermittant Negative for: Dementia; Quadriplegia; Paraplegia; Seizure Disorder Oncologic Medical History: Negative for: Received Chemotherapy; Received Radiation Psychiatric Medical History: Negative for: Anorexia/bulimia; Confinement Anxiety Immunizations Pneumococcal Vaccine: Received Pneumococcal Vaccination: No Implantable Devices None Hospitalization / Surgery History Type of Hospitalization/Surgery c section x2 left breast lumpectomy  iandD right ankle with theraskin Family and Social History Cancer: No; Diabetes: Yes - Mother; Heart Disease: No; Hereditary Spherocytosis: No; Hypertension: No; Kidney Disease: No; Lung Disease: Yes - Mother; Seizures: No; Stroke: No; Thyroid Problems: No; Tuberculosis: No; Never smoker; Marital Status - Married; Alcohol Use: Never; Drug Use: No History; Caffeine Use: Daily; Financial Concerns: No; Food, Clothing or Shelter Needs: No; Support System Lacking: No; Transportation Concerns: No Electronic Signature(s) Signed: 10/18/2021 12:38:17 PM By: [name] Maudlin MD FACS Signed: 10/18/2021 6:59:01 PM By: Dellie Catholic RN Entered By: [name] on 10/18/2021 10:42:14 -------------------------------------------------------------------------------- SuperBill Details Patient Name: Date of Service: [place], [place] [name] 10/18/2021 Medical Record Number: [id number] Patient Account Number: [id number] Date of Birth/Sex: Treating RN: 04-03-1971 (51 y.o. 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 Facility Procedures CPT4 Code: [id number] Description: [phone] - DEBRIDE WOUND 1ST 20 SQ CM OR < ICD-10 Diagnosis Description L97.818 Non-pressure chronic ulcer of other part of right lower leg with other specified s Modifier: everity Quantity: 1 CPT4 Code: [id number] Description: 40086 - DEBRIDE WOUND EA ADDL 20 SQ CM ICD-10 Diagnosis Description L97.818 Non-pressure chronic ulcer of other part of right lower leg with other specified s Modifier: everity Quantity: 2 Physician Procedures : CPT4 Code Description Modifier [id number] - WC PHYS LEVEL 3 - EST PT 25 ICD-10 Diagnosis Description L97.818 Non-pressure chronic ulcer of other part of right lower leg with other specified severity D57.1 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

## 2021-10-19 ENCOUNTER — Other Ambulatory Visit: Payer: Self-pay | Admitting: Family Medicine

## 2021-10-19 DIAGNOSIS — G894 Chronic pain syndrome: Secondary | ICD-10-CM

## 2021-10-19 MED ORDER — OXYCODONE HCL 10 MG PO TABS: 10.0000 mg | ORAL_TABLET | ORAL | 0 refills | Status: DC

## 2021-10-19 NOTE — Progress Notes (Signed)
Reviewed PDMP substance reporting system prior to prescribing opiate medications. No inconsistencies noted.  Meds ordered this encounter  Medications   Oxycodone HCl 10 MG TABS    Sig: Take 1 tablet (10 mg total) by mouth every 4 (four) hours while awake.    Dispense:  90 tablet    Refill:  0    Order Specific Question:   Supervising Provider    Answer:   JEGEDE, OLUGBEMIGA E [[id number]]   [name] Ephraim Moore Cerise Lieber  APRN, MSN, FNP-C [name] [address]  [place], [place] [address] [phone]  

## 2021-10-23 ENCOUNTER — Encounter (HOSPITAL_BASED_OUTPATIENT_CLINIC_OR_DEPARTMENT_OTHER): Payer: Medicaid Other | Admitting: General Surgery

## 2021-10-23 DIAGNOSIS — L97812 Non-pressure chronic ulcer of other part of right lower leg with fat layer exposed: Secondary | ICD-10-CM | POA: Diagnosis not present

## 2021-10-23 NOTE — Progress Notes (Signed)
[name], [name] ([id number]) Visit Report for 10/23/2021 Chief Complaint Document Details Patient Name: Date of Service: [name] 10/23/2021 10:15 A M Medical Record Number: [id number] Patient Account Number: [id number] Date of Birth/Sex: Treating RN: 01-Dec-1970 (51 y.o. [name] Primary Care Provider: [name] Other Clinician: Referring Provider: Treating Provider/Extender: Elyse Jarvis Weeks in Treatment: 27 Information Obtained from: Patient Chief Complaint the patient is here for evaluation of her bilateral lower extremity sickle cell ulcers 04/17/2021; patient comes in for substantial wounds on the right and left lower leg Electronic Signature(s) Signed: 10/23/2021 11:30:03 AM By: Fredirick Maudlin MD FACS Entered By: Fredirick Maudlin on 10/23/2021 11:30:03 -------------------------------------------------------------------------------- Debridement Details Patient Name: Date of Service: [name], [name] 10/23/2021 10:15 A M Medical Record Number: [id number] Patient Account Number: [id number] Date of Birth/Sex: Treating RN: 07/27/1970 (51 y.o. [name] Primary Care Provider: [name] Other Clinician: Referring Provider: Treating Provider/Extender: Elyse Jarvis Weeks in Treatment: 27 Debridement Performed for Assessment: Wound #21 Right,Medial Ankle Performed By: Physician Fredirick Maudlin, MD Debridement Type: Debridement Severity of Tissue Pre Debridement: Fat layer exposed Level of Consciousness (Pre-procedure): Awake and Alert Pre-procedure Verification/Time Out Yes - 10:24 Taken: Start Time: 10:24 Pain Control: Other : benzocaine 20% T Area Debrided (L x W): otal 5.7 (cm) x 2.4 (cm) = 13.68 (cm) Tissue and other material debrided: Viable, Non-Viable, Slough, Subcutaneous, Slough Level: Skin/Subcutaneous Tissue Debridement Description: Excisional Instrument: Curette Bleeding:  Minimum Hemostasis Achieved: Pressure Procedural Pain: 0 Post Procedural Pain: 0 Response to Treatment: Procedure was tolerated well Level of Consciousness (Post- Awake and Alert procedure): Post Debridement Measurements of Total Wound Length: (cm) 5.7 Width: (cm) 2.4 Depth: (cm) 0.1 Volume: (cm) 1.074 Character of Wound/Ulcer Post Debridement: Improved Severity of Tissue Post Debridement: Fat layer exposed Post Procedure Diagnosis Same as Pre-procedure Electronic Signature(s) Signed: 10/23/2021 12:24:33 PM By: Fredirick Maudlin MD FACS Signed: 10/23/2021 5:21:47 PM By: Adline Peals Entered By: Adline Peals on 10/23/2021 10:25:15 -------------------------------------------------------------------------------- Debridement Details Patient Name: Date of Service: [name], [name] 10/23/2021 10:15 A M Medical Record Number: [id number] Patient Account Number: [id number] Date of Birth/Sex: Treating RN: 1970-08-14 (51 y.o. [name] Primary Care Provider: [name] Other Clinician: Referring Provider: Treating Provider/Extender: Elyse Jarvis Weeks in Treatment: 27 Debridement Performed for Assessment: Wound #17 Right,Lateral Lower Leg Performed By: Physician Fredirick Maudlin, MD Debridement Type: Debridement Level of Consciousness (Pre-procedure): Awake and Alert Pre-procedure Verification/Time Out Yes - 10:24 Taken: Start Time: 10:24 Pain Control: Other : benzocaine 20% T Area Debrided (L x W): otal 5.9 (cm) x 4.3 (cm) = 25.37 (cm) Tissue and other material debrided: Viable, Non-Viable, Slough, Subcutaneous, Slough Level: Skin/Subcutaneous Tissue Debridement Description: Excisional Instrument: Curette Bleeding: Minimum Hemostasis Achieved: Pressure Procedural Pain: 0 Post Procedural Pain: 0 Response to Treatment: Procedure was tolerated well Level of Consciousness (Post- Awake and Alert procedure): Post Debridement  Measurements of Total Wound Length: (cm) 5.9 Width: (cm) 4.3 Depth: (cm) 0.1 Volume: (cm) 1.993 Character of Wound/Ulcer Post Debridement: Improved Post Procedure Diagnosis Same as Pre-procedure Electronic Signature(s) Signed: 10/23/2021 12:24:33 PM By: Fredirick Maudlin MD FACS Signed: 10/23/2021 5:21:47 PM By: Adline Peals Entered By: Adline Peals on 10/23/2021 10:25:40 -------------------------------------------------------------------------------- HPI Details Patient Name: Date of Service: [name], [name] 10/23/2021 10:15 A M Medical Record Number: [id number] Patient Account Number: [id number] Date of Birth/Sex: Treating RN: [birth year] (51 y.o. [name] Primary Care Provider: [name] Other Clinician: Referring Provider: Treating  Provider/Extender: Elyse Jarvis Weeks in Treatment: 27 History of Present Illness Location: medial and lateral ankle region on the right and left medial malleolus Quality: Patient reports experiencing a shooting pain to affected area(s). Severity: Patient states wound(s) are getting worse. Duration: right lower extremity bimalleolar ulcers have been present for approximately 2 years; the rright meedial malleolus ulcer has been there proximally 6 months Timing: Pain in wound is constant (hurts all the time) Context: The wound would happen gradually ssociated Signs and Symptoms: Patient reports having increase discharge. A HPI Description: 51 year old patient old patient with a history of sickle cell anemia who was last seen by me with ulceration of the right lower extremity above the ankle and was referred to Dr. Leland Johns for a surgical debridement as I was unable to do anything in the office due to excruciating pain. At that stage she was referred from the plastic surgery service to dermatology who treated her for a skin infection with doxycycline and then Levaquin and a local antibiotic ointment. I understand the  patient has since developed ulceration on the left ankle both medial and lateral and was now referred back to the wound center as dermatology has finished the management. I do not have any notes from the dermatology department Old notes: 51 year old patient patient with a history of sickle cell anemia, pain bilateral lower extremities, right lower extremity ulcer and has a history of receiving a skin graft( Theraskin) several months ago. She has been visiting the wound center [facility] and was seen by Dr. Dellia Nims and Dr. Leland Johns. after prolonged conservator therapy between July 2016 and January 2017. She had been seen by the plastic surgeon and taken to the OR for debridement and application of Theraskin. She had 3 applications of Theraskin and was then treated with collagen. Prior to that she had a history of similar problems in 2014 and was treated conservatively. Had a reflux study done for the right lower extremity in August 2016 without reflux or DVT . Past medical history significant for sickle cell disease, anemia, leg ulcers, cholelithiasis,and has never been a smoker. Once the patient was discharged on the wound center she says within 2 or 3 weeks the problems recurred and she has been treating it conservatively. since I saw her 3 weeks ago at [facility] she has been unable to get her dressing material but has completed a course of doxycycline. 6/7/ 2017 -- lower extremity venous duplex reflux evaluation was done No evidence of SVT or DVT in the RLL. No venous incompetence in the RLL. No further vascular workup is indicated at this time. She was seen by Dr. [name], on 10/04/2015. She agreed with the plan of taking her to the OR for debridement and application of theraskin and would also take biopsies to rule out pyoderma gangrenosum. Follow-up note dated May 31 received and she was status post application of Theraskin to multiple ulcers around the right ankle. Pathology did not  show evidence of malignancy or pyoderma gangrenosum. She would continue to see as in the wound clinic for further care and see Dr. Leland Johns as needed. The patient brought the biopsy report and it was consistent with stasis ulcer no evidence of malignancy and the comment was that there was some adjacent neovascularization, fibrosis and patchy perivascular chronic inflammation. 11/15/2015 -- today we applied her first application of Theraskin 11/30/15; TheraSkin #2 12/13/2015 -- she is having a lot of pain locally and is here for possible application of a theraskin today. 01/16/2016 -- the  patient has significant pain and has noticed despite in spite of all local care and oral pain medication. It is impossible to debride her in the office. 02/06/2016 -- I do not see any notes from Dr. Iran Planas( the patient has not made a call to the office know as she heard from them) and the only visit to recently was with her PCP Dr. Danella Penton -- I saw her on 01/16/2016 and prescribed 90 tablets of oxycodone 10 mg and did lab work and screening for HIV. the HIV was negative and hemoglobin was 6.3 with a WBC count of 14.9 and hematocrit of 17.8 with platelets of 561. 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 The bimalleolar ulcers to the right lower extremity have been present for approximately 2 years, the left medial malleolus ulcer has been present approximately 6 months. She has followed with Dr.Thimmappa in the past and has had a total of 3 applications of Theraskin (01/2015, 09/2015, 06/17/16). She has also followed with Dr. Con Memos here in the clinic and has received 2 applications of TheraSkin (11/10/15, 11/30/15). The patient does experience chronic, and is not amenable to debridement. 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 She is not currently on  any antibiotic therapy and has not been treated with any recently. 07/17/2016 -- was seen by Dr. Iran Planas of plastic surgery who saw her 2 weeks postop application of Theraskin #3. She had removed her dressing and asked her to apply silver alginate on alternate days and follow-up back with the wound center. Future debridements and application of skin substitute would have to be done in the hospital due to her high risk for anesthesia. READMISSION 04/17/2021 Patient is now a 51 year old woman that we have had in this clinic for a prolonged period of time and 2016-2017 and then again for 2 visits in February 2018. At that point she had wounds on the right lower leg predominantly medial. She had also been seen by plastic surgery Dr. Leland Johns who I believe took her to the OR for operative debridement and application of TheraSkin in 2017. After she left our clinic she was followed for a very prolonged period of time in the wound care center in [facility] who then referred her ultimately to [facility] where she was seen by Dr. Vernona Rieger. Again taken her to the OR for skin grafting which apparently did not take. She had multiple other attempts at dressings although I have not really looked over all of these notes in great detail. She has not been seen in a wound care center in about a year. She states over the last year in addition to her right lower leg she has developed wounds on the left lower leg quite extensive. She is using Xeroform to all of these wounds without really any improvement. She also has Medicaid which does not cover wound products. The patient has had vascular work-ups in the past including most recently on 03/28/2021 showing biphasic waveforms on the right triphasic at the PTA and biphasic at the dorsalis pedis on the left. She was unable to tolerate any degree of compression to do ABIs. Unfortunately TBI's were also not done. She had venous reflux studies done in 2017. This did not show any  evidence of a DVT or SVT and no venous incompetence was noted in the right leg at the time this was  the only side with the wound As noted I did not look all over her old records. She apparently had a course of HBO and [facility] although I am not sure what the indication would have been. In any case she developed seizures and terminated treatment earlier. She is generally much more disabled than when we last saw her in clinic. She can no longer walk pretty much wheelchair-bound because predominantly of pain in the left hip. 04/24/2021; the patient tolerated the wraps we put on. We used Santyl and Hydrofera Blue under compression. I brought her back for a nurse visit for a change in dressing. With Medicaid we will have a hard time getting anything paid for and hence the need for compression. She arrives in clinic with all the wounds looking somewhat better in terms of surface 12/20; circumferential wound on the right from the lateral to the medial. She has open areas on the left medial and left lateral x2 on all of this with the same surface. This does not look completely healthy although she does have some epithelialization. She is not complaining of a lot of pain which is unusual for her sickle ulcers. I have not looked over her extensive records from [facility]. She had recent arterial studies and has a history of venous reflux studies I will need to look these over although I do not believe she has significant arterial disease 2023 05/22/2021; patient's wound areas measure slightly smaller. Still a lot of drainage coming from the right we have been using Hydrofera Blue and Santyl with some improvement in the wound surfaces. She tells me she will be getting transfused later in the week for her underlying sickle cell anemia I have looked over her recent arterial studies which were done in the fall. This was in November and showed biphasic and triphasic waveforms but she could not tolerate ABIs because of  pressure and unfortunately TBI's were not done. She has not had recent venous reflux studies that I can see 1/10; not much change about the same surface area. This has a yellowish surface to it very gritty. We have been using Santyl and Hydrofera Blue for a prolonged period. Culture I did last week showed methicillin sensitive staph aureus "rare". Our intake nurse reports greenish drainage which may be the Hydrofera Blue itself 1/17; wounds are continue to measure smaller although I am not sure about the accuracy here. Especially the areas on the right are covered in what looks to be a nonviable surface although she does have some epithelialization. Similarly she has areas on the left medial and left lateral ankle area which appear to have a better surface and perhaps are slightly smaller. We have been using Santyl and Hydrofera Blue. She cannot tolerate mechanical debridements She went for her reflux studies which showed significant reflux at the greater saphenous vein at the saphenofemoral junction as well as the greater saphenous vein in the proximal calf on the left she had reflux in the thigh and the common femoral vein and supra vein Fishel vein reflux in the greater saphenous vein. I will have vein and vascular look at this. My thoughts have been that these are likely sickle wounds. I looked through her old records from [facility] wound care center and then when she graduated to [facility] wound care center where she saw Dr. Zigmund Daniel and Dr. Vernona Rieger. Although I can see she had reflux studies done I do not see that she actually saw a vein and vascular. I  went over the fact that she had operative debridements and actual skin grafting that did not take. I do not think these wounds have ever really progressed towards healing 1/31;Substantial wounds on the right ankle area. Hyper granulated very gritty adherent debris on the surface. 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 On the left she has no DVT or SVT deep vein reflux in the common femoral vein. Superficial vein reflux in the greater saphenous vein on although the vein diameter is not really all that large. I do not think there is anything that can be done with these although I am going to send her for consultation to vein and vascular. 2/7; Wound exam; substantial wound area on the right posterior ankle area and areas on the left medial ankle and left lateral ankle. I was able to debride the left medial ankle last week fairly aggressively and it is back this week to a completely nonviable surface She will see vascular surgery this Friday and I would like them to review the venous studies and also any comments on her arterial status. If they do not see an issue here I am going to refer her to plastic surgery for an operative debridement perhaps intraoperative ACell or Integra. Eventually she will require a deep tissue culture again 2/14; substantial wound area on the right posterior ankle, medial ankle. We have been using silver alginate The patient was seen by vein and vascular she had both venous reflux studies and arterial studies. 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 She had no evidence of superficial venous thrombosis. She did have superficial vein reflux at the saphenofemoral junction and the greater saphenous vein. On the left no evidence of a DVT no evidence of superficial venous throat thrombosis she did have deep vein reflux in the common femoral vein and superficial vein reflux in the  greater saphenous vein but these were not felt to be amenable to ablation. In terms of arterial studies she had triphasic and wife biphasic wave waveforms bilaterally not felt to have a significant arterial issue. I do not get the feeling that they felt that any part of her nonhealing wounds were related to either arterial or venous issues. They did note that she had venous reflux at the right at the [facility] and [facility]. And also on the left there were reflux in the deep system at the common femoral vein and greater saphenous vein in the proximal thigh. Nothing amenable to ablation. 2/20; she is making some decent progress on the right where there is nice skin between the 2 open areas on the right ankle. The surfaces here do not look viable yet there is some surrounding epithelialization. She still has a small area on the left medial ankle area. Hyper-granulated Jody's away always 2/28 patient has an appointment with plastic surgery on 3/8. We will see her back on 3/9. She may have to call us to get the area redressed. We've been using Santyl under silver alginate. We made a nice improvement on the left medial  ankle. The larger wounds on the right also looks somewhat better in terms of epithelialization although I think they could benefit from an aggressive debridement if plastic surgery would be willing to do that. Perhaps placement of Integra or a cell 07/26/2021: She saw Dr. Claudia Desanctis yesterday. He raised the question as to whether or not this might be pyoderma and wanted to wait until that question was answered by dermatology before proceeding with any sort of operative debridement. We have continue to use Santyl under silver alginate with Kerlix and Coban wraps. Overall, her wounds appear to be continuing to contract and epithelialize, with some granulation tissue present. There continues to be some slough on all wound surfaces. 08/09/2021: She has not been able to get an appointment with dermatology because  apparently the offices in [place] do not accept Medicaid. She is looking into whether or not she can be seen at the main [facility] dermatology clinic. This is necessary because plastic surgery is concerned that her wounds might represent pyoderma and they did not want to do any procedure until that was clarified. We have been using Santyl under silver alginate with Kerlix and Coban wraps. Today, there was a greater amount of drainage on her dressings with a slight green discoloration and significant odor. Despite this, her wounds continue to contract and epithelialize. There is pale granulation tissue present and actually, on the left medial ankle, the granulation tissue is a bit hypertrophic. 08/16/2021: Last week, I took a culture and this grew back rare methicillin-resistant Staph aureus and rare corynebacterium. The MRSA was sensitive to gentamicin which we began applying topically on an empiric basis. This week, her wounds are a bit smaller and the drainage and odor are less. Her primary care provider is working on assisting the patient with a dermatology evaluation. She has been in silver alginate over the gentamicin that was started last week along with Kerlix and Coban wraps. 08/23/2021: Because she has Medicaid, we have been unable to get her into see any dermatologist in the [place] to rule out pyoderma gangrenosum, which was a requirement from plastic surgery prior to any sort of debridement and grafting. Despite this, however, all of her wounds continue to get smaller. The wound on her left medial ankle is nearly closed. There is no odor from the wounds, although she still accumulates a modest amount of drainage on her dressings. 08/30/2021: The lateral right ankle wound and the medial left ankle wound are a bit smaller today. The medial right ankle wound is about the same size. They are less tender. We have still been unable to get her into dermatology. 09/06/2021:  All of the wounds are about the same size today. She continues to endorse minimal pain. I communicated with Dr. Claudia Desanctis in plastic surgery regarding our issues getting a dermatology appointment; he was out of town but indicated that he would look into perhaps performing the biopsy in his office and will have his office contact her. 09/14/2021: The patient has an appointment in dermatology, but it is not until October. Her wounds are roughly the same; she continues to have very thick purulent-looking drainage on her dressings. 09/20/2021: The left medial wound is nearly closed and just has a bit of accumulated eschar on the surface. The right medial and lateral ankle wounds are perhaps a little bit smaller. They continue to have a very pale surface with accumulation of thin slough. PCR culture done last week returned with MRSA but fairly low  levels. I did not think [employer] was indicated based on this. She is getting topical mupirocin with Prisma silver collagen. 10/04/2021: The patient was not seen in clinic last week due to childcare coverage issues. In the interim, the left medial leg wound has closed. The right sided leg wounds are smaller. There is more granulation tissue coming through, particularly on the lateral wound. The surface remains somewhat gritty. We have been applying topical mupirocin and Prisma silver collagen. 10/11/2021: The left medial leg wound remains closed. She does complain of some anesthetic sensation to the area. Both of the right-sided leg wounds are smaller but still have accumulated slough. 10/18/2021: Both right-sided leg wounds are minimally smaller this week. She still continues to accumulate slough and has thick drainage on her dressings. 10/23/2021: Both wounds continue to contract. There is still slough buildup. She has been approved for a keratin-based skin substitute trial product but it will not be available until next week. Electronic Signature(s) Signed: 10/23/2021  11:31:04 AM By: Fredirick Maudlin MD FACS Entered By: Fredirick Maudlin on 10/23/2021 11:31:04 -------------------------------------------------------------------------------- Physical Exam Details Patient Name: Date of Service: [name], [name] 10/23/2021 10:15 A M Medical Record Number: [id number] Patient Account Number: [id number] Date of Birth/Sex: Treating RN: Jan 26, 1971 (51 y.o. [name] Primary Care Provider: [name] Other Clinician: Referring Provider: Treating Provider/Extender: Elyse Jarvis Weeks in Treatment: 27 Constitutional . . . . No acute distress. Respiratory Normal work of breathing on room air. Notes 10/23/2021: Both wounds are little bit smaller again this week. Slough accumulation is the same. Surfaces remain fibrous and gritty. Electronic Signature(s) Signed: 10/23/2021 11:34:30 AM By: Fredirick Maudlin MD FACS Entered By: Fredirick Maudlin on 10/23/2021 11:34:30 -------------------------------------------------------------------------------- Physician Orders Details Patient Name: Date of Service: [name], [name] 10/23/2021 10:15 A M Medical Record Number: [id number] Patient Account Number: [id number] Date of Birth/Sex: Treating RN: 12/19/1970 (51 y.o. [name] Primary Care Provider: [name] Other Clinician: Referring Provider: Treating Provider/Extender: Elyse Jarvis Weeks in Treatment: 53 Verbal / Phone Orders: No Diagnosis Coding ICD-10 Coding Code Description L97.818 Non-pressure chronic ulcer of other part of right lower leg with other specified severity 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 Follow-up Appointments ppointment in 1 week. - Dr. Celine Ahr - Room 3 - Return A Bathing/ Shower/ Hygiene May shower with protection but do not get wound dressing(s) wet. - Can get cast protector bags at [facility] or [facility] Edema Control - Lymphedema / SCD / Other Elevate legs to the level of  the heart or above for 30 minutes daily and/or when sitting, a frequency of: - throughout the day Avoid standing for long periods of time. Exercise regularly Additional Orders / Instructions Follow Nutritious Diet Wound Treatment Wound #17 - Lower Leg Wound Laterality: Right, Lateral Cleanser: Soap and Water 1 x Per Week/30 Days Discharge Instructions: May shower and wash wound with dial antibacterial soap and water prior to dressing change. Cleanser: Wound Cleanser 1 x Per Week/30 Days Discharge Instructions: Cleanse the wound with wound cleanser prior to applying a clean dressing using gauze sponges, not tissue or cotton balls. Peri-Wound Care: Triamcinolone 15 (g) 1 x Per Week/30 Days Discharge Instructions: Use triamcinolone 15 (g) as directed Peri-Wound Care: Sween Lotion (Moisturizing lotion) 1 x Per Week/30 Days Discharge Instructions: Apply moisturizing lotion as directed Topical: Mupirocin Ointment 1 x Per Week/30 Days Discharge Instructions: Apply Mupirocin (Bactroban) as instructed Prim Dressing: Promogran Prisma Matrix, 4.34 (sq in) (silver collagen) 1 x Per  Week/30 Days ary Discharge Instructions: Moisten collagen with saline or hydrogel Secondary Dressing: ABD Pad, 5x9 1 x Per Week/30 Days Discharge Instructions: Apply over primary dressing as directed. Secondary Dressing: Zetuvit Plus 4x8 in 1 x Per Week/30 Days Discharge Instructions: Apply over primary dressing as directed. Compression Wrap: Kerlix Roll 4.5x3.1 (in/yd) 1 x Per Week/30 Days Discharge Instructions: Apply Kerlix and Coban compression as directed. Compression Wrap: Coban Self-Adherent Wrap 4x5 (in/yd) 1 x Per Week/30 Days Discharge Instructions: Apply over Kerlix as directed. Wound #21 - Ankle Wound Laterality: Right, Medial Cleanser: Soap and Water 1 x Per Week/30 Days Discharge Instructions: May shower and wash wound with dial antibacterial soap and water prior to dressing change. Cleanser: Wound  Cleanser 1 x Per Week/30 Days Discharge Instructions: Cleanse the wound with wound cleanser prior to applying a clean dressing using gauze sponges, not tissue or cotton balls. Peri-Wound Care: Triamcinolone 15 (g) 1 x Per Week/30 Days Discharge Instructions: Use triamcinolone 15 (g) as directed Peri-Wound Care: Sween Lotion (Moisturizing lotion) 1 x Per Week/30 Days Discharge Instructions: Apply moisturizing lotion as directed Topical: Mupirocin Ointment 1 x Per Week/30 Days Discharge Instructions: Apply Mupirocin (Bactroban) as instructed Prim Dressing: Promogran Prisma Matrix, 4.34 (sq in) (silver collagen) 1 x Per Week/30 Days ary Discharge Instructions: Moisten collagen with saline or hydrogel Secondary Dressing: ABD Pad, 5x9 1 x Per Week/30 Days Discharge Instructions: Apply over primary dressing as directed. Secondary Dressing: Zetuvit Plus 4x8 in 1 x Per Week/30 Days Discharge Instructions: Apply over primary dressing as directed. Compression Wrap: Kerlix Roll 4.5x3.1 (in/yd) 1 x Per Week/30 Days Discharge Instructions: Apply Kerlix and Coban compression as directed. Compression Wrap: Coban Self-Adherent Wrap 4x5 (in/yd) 1 x Per Week/30 Days Discharge Instructions: Apply over Kerlix as directed. Electronic Signature(s) Signed: 10/23/2021 12:24:33 PM By: Fredirick Maudlin MD FACS Entered By: Fredirick Maudlin on 10/23/2021 11:34:54 -------------------------------------------------------------------------------- Problem List Details Patient Name: Date of Service: [name], [name] 10/23/2021 10:15 A M Medical Record Number: [id number] Patient Account Number: [id number] Date of Birth/Sex: Treating RN: [birth year] (51 y.o. [name] Primary Care Provider: [name] Other Clinician: Referring Provider: Treating Provider/Extender: Elyse Jarvis Weeks in Treatment: 27 Active Problems ICD-10 Encounter Code Description Active Date MDM Diagnosis L97.818  Non-pressure chronic ulcer of other part of right lower leg with other specified 04/17/2021 No Yes severity 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 04/17/2021 No Yes Inactive Problems ICD-10 Code Description Active Date Inactive Date L97.828 Non-pressure chronic ulcer of other part of left lower leg with other specified severity 04/17/2021 04/17/2021 Resolved Problems Electronic Signature(s) Signed: 10/23/2021 11:29:50 AM By: Fredirick Maudlin MD FACS Entered By: Fredirick Maudlin on 10/23/2021 11:29:49 -------------------------------------------------------------------------------- Progress Note Details Patient Name: Date of Service: [name], [name] 10/23/2021 10:15 A M Medical Record Number: [id number] Patient Account Number: [id number] Date of Birth/Sex: Treating RN: 1970-07-21 (51 y.o. [name] Primary Care Provider: [name] Other Clinician: Referring Provider: Treating Provider/Extender: Elyse Jarvis Weeks in Treatment: 27 Subjective Chief Complaint Information obtained from Patient the patient is here for evaluation of her bilateral lower extremity sickle cell ulcers 04/17/2021; patient comes in for substantial wounds on the right and left lower leg History of Present Illness (HPI) The following HPI elements were documented for the patient's wound: Location: medial and lateral ankle region on the right and left medial malleolus Quality: Patient reports experiencing a shooting pain to affected area(s). Severity: Patient states wound(s) are getting worse. Duration: right lower extremity  bimalleolar ulcers have been present for approximately 2 years; the rright meedial malleolus ulcer has been there proximally 6 months Timing: Pain in wound is constant (hurts all the time) Context: The wound would happen gradually Associated Signs and Symptoms: Patient reports having increase discharge. 51 year old patient with a history of sickle cell anemia  who was last seen by me with ulceration of the right lower extremity above the ankle and was referred to Dr. Leland Johns for a surgical debridement as I was unable to do anything in the office due to excruciating pain. At that stage she was referred from the plastic surgery service to dermatology who treated her for a skin infection with doxycycline and then Levaquin and a local antibiotic ointment. I understand the patient has since developed ulceration on the left ankle both medial and lateral and was now referred back to the wound center as dermatology has finished the management. I do not have any notes from the dermatology department Old notes: 51 year old patient with a history of sickle cell anemia, pain bilateral lower extremities, right lower extremity ulcer and has a history of receiving a skin graft( Theraskin) several months ago. She has been visiting the wound center [facility] and was seen by Dr. Dellia Nims and Dr. Leland Johns. after prolonged conservator therapy between July 2016 and January 2017. She had been seen by the plastic surgeon and taken to the OR for debridement and application of Theraskin. She had 3 applications of Theraskin and was then treated with collagen. Prior to that she had a history of similar problems in 2014 and was treated conservatively. Had a reflux study done for the right lower extremity in August 2016 without reflux or DVT . Past medical history significant for sickle cell disease, anemia, leg ulcers, cholelithiasis,and has never been a smoker. Once the patient was discharged on the wound center she says within 2 or 3 weeks the problems recurred and she has been treating it conservatively. since I saw her 3 weeks ago at [facility] she has been unable to get her dressing material but has completed a course of doxycycline. 6/7/ 2017 -- lower extremity venous duplex reflux evaluation was done oo No evidence of SVT or DVT in the RLL. No venous incompetence in the RLL.  No further vascular workup is indicated at this time. She was seen by Dr. [name], on 10/04/2015. She agreed with the plan of taking her to the OR for debridement and application of theraskin and would also take biopsies to rule out pyoderma gangrenosum. Follow-up note dated May 31 received and she was status post application of Theraskin to multiple ulcers around the right ankle. Pathology did not show evidence of malignancy or pyoderma gangrenosum. She would continue to see as in the wound clinic for further care and see Dr. Leland Johns as needed. The patient brought the biopsy report and it was consistent with stasis ulcer no evidence of malignancy and the comment was that there was some adjacent neovascularization, fibrosis and patchy perivascular chronic inflammation. 11/15/2015 -- today we applied her first application of Theraskin 11/30/15; TheraSkin #2 12/13/2015 -- she is having a lot of pain locally and is here for possible application of a theraskin today. 01/16/2016 -- the patient has significant pain and has noticed despite in spite of all local care and oral pain medication. It is impossible to debride her in the office. 02/06/2016 -- I do not see any notes from Dr. Iran Planas( the patient has not made a call to the office know  as she heard from them) and the only visit to recently was with her PCP Dr. Danella Penton -- I saw her on 01/16/2016 and prescribed 90 tablets of oxycodone 10 mg and did lab work and screening for HIV. the HIV was negative and hemoglobin was 6.3 with a WBC count of 14.9 and hematocrit of 17.8 with platelets of 561. 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 The bimalleolar ulcers to the right lower extremity have been present for approximately 2 years, the left medial malleolus ulcer has been present approximately 6 months. She has followed  with Dr.Thimmappa in the past and has had a total of 3 applications of Theraskin (01/2015, 09/2015, 06/17/16). She has also followed with Dr. Con Memos here in the clinic and has received 2 applications of TheraSkin (11/10/15, 11/30/15). The patient does experience chronic, and is not amenable to debridement. 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 She is not currently on any antibiotic therapy and has not been treated with any recently. 07/17/2016 -- was seen by Dr. Iran Planas of plastic surgery who saw her 2 weeks postop application of Theraskin #3. She had removed her dressing and asked her to apply silver alginate on alternate days and follow-up back with the wound center. Future debridements and application of skin substitute would have to be done in the hospital due to her high risk for anesthesia. READMISSION 04/17/2021 Patient is now a 51 year old woman that we have had in this clinic for a prolonged period of time and 2016-2017 and then again for 2 visits in February 2018. At that point she had wounds on the right lower leg predominantly medial. She had also been seen by plastic surgery Dr. Leland Johns who I believe took her to the OR for operative debridement and application of TheraSkin in 2017. After she left our clinic she was followed for a very prolonged period of time in the wound care center in [facility] who then referred her ultimately to [facility] where she was seen by Dr. Vernona Rieger. Again taken her to the OR for skin grafting which apparently did not take. She had multiple other attempts at dressings although I have not really looked over all of these notes in great detail. She has not been seen in a wound care center in about a year. She states over the last year in addition to her right lower leg she has developed wounds on the left lower leg quite extensive. She is using Xeroform to all of these wounds without really any improvement. She also has  Medicaid which does not cover wound products. The patient has had vascular work-ups in the past including most recently on 03/28/2021 showing biphasic waveforms on the right triphasic at the PTA and biphasic at the dorsalis pedis on the left. She was unable to tolerate any degree of compression to do ABIs. Unfortunately TBI's were also not done. She had venous reflux studies done in 2017. This did not show any evidence of a DVT or SVT and no venous incompetence was noted in the right leg at the time this was the only side with the wound As noted I did not look all over her old records. She apparently had a course of HBO and [facility] although I am not sure what the indication would have been. In any case she developed seizures and terminated treatment earlier. She is  generally much more disabled than when we last saw her in clinic. She can no longer walk pretty much wheelchair-bound because predominantly of pain in the left hip. 04/24/2021; the patient tolerated the wraps we put on. We used Santyl and Hydrofera Blue under compression. I brought her back for a nurse visit for a change in dressing. With Medicaid we will have a hard time getting anything paid for and hence the need for compression. She arrives in clinic with all the wounds looking somewhat better in terms of surface 12/20; circumferential wound on the right from the lateral to the medial. She has open areas on the left medial and left lateral x2 on all of this with the same surface. This does not look completely healthy although she does have some epithelialization. She is not complaining of a lot of pain which is unusual for her sickle ulcers. I have not looked over her extensive records from [facility] She had recent arterial studies and has a history of venous reflux studies I will need to look these over although I do not believe she has significant arterial disease 2023 05/22/2021; patient's wound areas measure slightly smaller. Still a  lot of drainage coming from the right we have been using Hydrofera Blue and Santyl with some improvement in the wound surfaces. She tells me she will be getting transfused later in the week for her underlying sickle cell anemia I have looked over her recent arterial studies which were done in the fall. This was in November and showed biphasic and triphasic waveforms but she could not tolerate ABIs because of pressure and unfortunately TBI's were not done. She has not had recent venous reflux studies that I can see 1/10; not much change about the same surface area. This has a yellowish surface to it very gritty. We have been using Santyl and Hydrofera Blue for a prolonged period. Culture I did last week showed methicillin sensitive staph aureus "rare". Our intake nurse reports greenish drainage which may be the Hydrofera Blue itself 1/17; wounds are continue to measure smaller although I am not sure about the accuracy here. Especially the areas on the right are covered in what looks to be a nonviable surface although she does have some epithelialization. Similarly she has areas on the left medial and left lateral ankle area which appear to have a better surface and perhaps are slightly smaller. We have been using Santyl and Hydrofera Blue. She cannot tolerate mechanical debridements She went for her reflux studies which showed significant reflux at the greater saphenous vein at the saphenofemoral junction as well as the greater saphenous vein in the proximal calf on the left she had reflux in the thigh and the common femoral vein and supra vein Fishel vein reflux in the greater saphenous vein. I will have vein and vascular look at this. My thoughts have been that these are likely sickle wounds. I looked through her old records from [facility] wound care center and then when she graduated to [facility] wound care center where she saw Dr. Zigmund Daniel and Dr. Vernona Rieger. Although I can see she had reflux  studies done I do not see that she actually saw a vein and vascular. I went over the fact that she had operative debridements and actual skin grafting that did not take. I do not think these wounds have ever really progressed towards healing 1/31;Substantial wounds on the right ankle area. Hyper granulated very gritty adherent debris on the surface. 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 On the left she has no DVT or SVT deep vein reflux in the common femoral vein. Superficial vein reflux in the greater saphenous vein on although the vein diameter is not really all that large. I do not think there is anything that can be done with these although I am going to send her for consultation to vein and vascular. 2/7; Wound exam; substantial wound area on the right posterior ankle area and areas on the left medial ankle and left lateral ankle. I was able to debride the left medial ankle last week fairly aggressively and it is back this week to a completely nonviable surface She will see vascular surgery this Friday and I would like them to review the venous studies and also any comments on her arterial status. If they do not see an issue here I am going to refer her to plastic surgery for an operative debridement perhaps intraoperative ACell or Integra. Eventually she will require a deep tissue culture again 2/14; substantial wound area on the right posterior ankle, medial ankle. We have been using silver alginate The patient was seen by vein and vascular she had both venous reflux studies and arterial studies. 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 She had no evidence of superficial venous thrombosis. She did have superficial vein reflux at the saphenofemoral junction and the greater saphenous vein. On the left no evidence of a DVT no evidence of superficial venous throat thrombosis she did have deep vein reflux in the common femoral vein and superficial vein reflux in the greater saphenous vein but these were not felt to be amenable to ablation. In terms of arterial studies she had triphasic and wife biphasic wave waveforms bilaterally not felt to have a significant arterial issue. I do not get the feeling that they felt that any part of her nonhealing wounds were related to either arterial or venous issues. They did note that she had venous reflux at the right at the [facility] and [facility]. And also on the left there were reflux in the deep system at the common femoral vein and greater saphenous vein in the proximal thigh. Nothing amenable to ablation. 2/20; she is making some decent progress on the right where there is nice skin between the 2 open areas on the right ankle. The surfaces here do not look viable yet there is some surrounding epithelialization. She still has a small area on the left medial ankle area. Hyper-granulated Jody's away always 2/28 patient has an appointment with plastic surgery on 3/8. We will see her back on 3/9. She may have to call us to get the area redressed. We've been using Santyl under silver alginate. We made a nice improvement on the left medial ankle. The larger wounds on the right also looks somewhat better in terms of epithelialization although I think they could benefit from an aggressive debridement if plastic surgery would be willing to do that. Perhaps placement of Integra or a cell 07/26/2021: She saw Dr. Claudia Desanctis yesterday. He raised  the question as to whether or not this might be pyoderma and wanted to wait until that question was  answered by dermatology before proceeding with any sort of operative debridement. We have continue to use Santyl under silver alginate with Kerlix and Coban wraps. Overall, her wounds appear to be continuing to contract and epithelialize, with some granulation tissue present. There continues to be some slough on all wound surfaces. 08/09/2021: She has not been able to get an appointment with dermatology because apparently the offices in [place] do not accept Medicaid. She is looking into whether or not she can be seen at the main [facility] dermatology clinic. This is necessary because plastic surgery is concerned that her wounds might represent pyoderma and they did not want to do any procedure until that was clarified. We have been using Santyl under silver alginate with Kerlix and Coban wraps. Today, there was a greater amount of drainage on her dressings with a slight green discoloration and significant odor. Despite this, her wounds continue to contract and epithelialize. There is pale granulation tissue present and actually, on the left medial ankle, the granulation tissue is a bit hypertrophic. 08/16/2021: Last week, I took a culture and this grew back rare methicillin-resistant Staph aureus and rare corynebacterium. The MRSA was sensitive to gentamicin which we began applying topically on an empiric basis. This week, her wounds are a bit smaller and the drainage and odor are less. Her primary care provider is working on assisting the patient with a dermatology evaluation. She has been in silver alginate over the gentamicin that was started last week along with Kerlix and Coban wraps. 08/23/2021: Because she has Medicaid, we have been unable to get her into see any dermatologist in the [place] to rule out pyoderma gangrenosum, which was a requirement from plastic surgery prior to any sort of debridement and grafting. Despite this, however, all of her wounds continue to get  smaller. The wound on her left medial ankle is nearly closed. There is no odor from the wounds, although she still accumulates a modest amount of drainage on her dressings. 08/30/2021: The lateral right ankle wound and the medial left ankle wound are a bit smaller today. The medial right ankle wound is about the same size. They are less tender. We have still been unable to get her into dermatology. 09/06/2021: All of the wounds are about the same size today. She continues to endorse minimal pain. I communicated with Dr. Claudia Desanctis in plastic surgery regarding our issues getting a dermatology appointment; he was out of town but indicated that he would look into perhaps performing the biopsy in his office and will have his office contact her. 09/14/2021: The patient has an appointment in dermatology, but it is not until October. Her wounds are roughly the same; she continues to have very thick purulent-looking drainage on her dressings. 09/20/2021: The left medial wound is nearly closed and just has a bit of accumulated eschar on the surface. The right medial and lateral ankle wounds are perhaps a little bit smaller. They continue to have a very pale surface with accumulation of thin slough. 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 I did not think [employer] was indicated based on this. She is getting topical mupirocin with Prisma silver collagen. 10/04/2021: The patient was not seen in clinic last week due to childcare coverage issues. In the interim, the left medial leg wound has closed. The right sided leg  wounds are smaller. There is more granulation tissue coming through, particularly on the lateral wound. The surface remains somewhat gritty. We have been applying topical mupirocin and Prisma silver collagen. 10/11/2021: The left medial leg wound remains closed. She does complain of some anesthetic sensation to the area. Both of the right-sided leg wounds are smaller but still have  accumulated slough. 10/18/2021: Both right-sided leg wounds are minimally smaller this week. She still continues to accumulate slough and has thick drainage on her dressings. 10/23/2021: Both wounds continue to contract. There is still slough buildup. She has been approved for a keratin-based skin substitute trial product but it will not be available until next week. Patient History Information obtained from Patient. Family History Diabetes - Mother, Lung Disease - Mother, No family history of Cancer, Heart Disease, Hereditary Spherocytosis, Hypertension, Kidney Disease, Seizures, Stroke, Thyroid Problems, Tuberculosis. Social History Never smoker, Marital Status - Married, Alcohol Use - Never, Drug Use - No History, Caffeine Use - Daily. Medical History Eyes Denies history of Cataracts, Glaucoma, Optic Neuritis Ear/Nose/Mouth/Throat Denies history of Chronic sinus problems/congestion, Middle ear problems Hematologic/Lymphatic Patient has history of Anemia, Sickle Cell Disease Denies history of Hemophilia, Human Immunodeficiency Virus, Lymphedema Respiratory Denies history of Aspiration, Asthma, Chronic Obstructive Pulmonary Disease (COPD), Pneumothorax, Sleep Apnea, Tuberculosis Cardiovascular Denies history of Angina, Arrhythmia, Congestive Heart Failure, Coronary Artery Disease, Deep Vein Thrombosis, Hypertension, Hypotension, Myocardial Infarction, Peripheral Arterial Disease, Peripheral Venous Disease, Phlebitis, Vasculitis Gastrointestinal Denies history of Cirrhosis , Colitis, Crohnoos, Hepatitis A, Hepatitis B, Hepatitis C Endocrine Denies history of Type I Diabetes, Type II Diabetes Genitourinary Denies history of End Stage Renal Disease Immunological Denies history of Lupus Erythematosus, Raynaudoos, Scleroderma Integumentary (Skin) Denies history of History of Burn Musculoskeletal Denies history of Gout, Rheumatoid Arthritis, Osteoarthritis,  Osteomyelitis Neurologic Patient has history of Neuropathy - right foot intermittant Denies history of Dementia, Quadriplegia, Paraplegia, Seizure Disorder Oncologic Denies history of Received Chemotherapy, Received Radiation Psychiatric Denies history of Anorexia/bulimia, Confinement Anxiety Hospitalization/Surgery History - c section x2. - left breast lumpectomy. - iandD right ankle with theraskin. Medical A Surgical History Notes nd Constitutional Symptoms (General Health) H/O miscarriage Cardiovascular bradycardia Gastrointestinal cholilithiasis Objective Constitutional No acute distress. Vitals Time Taken: 10:00 AM, Height: 67 in, Temperature: 98.6 F, Pulse: 88 bpm, Respiratory Rate: 17 breaths/min, Blood Pressure: 129/69 mmHg. Respiratory Normal work of breathing on room air. General Notes: 10/23/2021: Both wounds are little bit smaller again this week. Slough accumulation is the same. Surfaces remain fibrous and gritty. Integumentary (Hair, Skin) Wound #17 status is Open. Original cause of wound was Gradually Appeared. The date acquired was: 10/05/2012. The wound has been in treatment 27 weeks. The wound is located on the Right,Lateral Lower Leg. The wound measures 5.9cm length x 4.3cm width x 0.1cm depth; 19.926cm^2 area and 1.993cm^3 volume. There is Fat Layer (Subcutaneous Tissue) exposed. There is no tunneling or undermining noted. There is a medium amount of purulent drainage noted. The wound margin is flat and intact. There is small (1-33%) pink, hyper - granulation within the wound bed. There is a large (67-100%) amount of necrotic tissue within the wound bed including Adherent Slough. Wound #21 status is Open. Original cause of wound was Gradually Appeared. The date acquired was: 06/26/2021. The wound has been in treatment 17 weeks. The wound is located on the Right,Medial Ankle. The wound measures 5.7cm length x 2.4cm width x 0.1cm depth; 10.744cm^2 area and 1.074cm^3  volume. There is Fat Layer (Subcutaneous Tissue) exposed. There is no tunneling  or undermining noted. There is a medium amount of purulent drainage noted. The wound margin is distinct with the outline attached to the wound base. There is small (1-33%) pink granulation within the wound bed. There is a large (67-100%) amount of necrotic tissue within the wound bed including Adherent Slough. 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 There was a Excisional Skin/Subcutaneous Tissue Debridement with a total area of 25.37 sq cm performed by Fredirick Maudlin, MD. With the following instrument(s): Curette to remove Viable and Non-Viable tissue/material. Material removed includes Subcutaneous Tissue and Slough and after achieving pain control using Other (benzocaine 20%). No specimens were taken. A time out was conducted at 10:24, prior to the start of the procedure. A Minimum amount of bleeding was controlled with Pressure. The procedure was tolerated well with a pain level of 0 throughout and a pain level of 0 following the procedure. Post Debridement Measurements: 5.9cm length x 4.3cm width x 0.1cm depth; 1.993cm^3 volume. Character of Wound/Ulcer Post Debridement is improved. Post procedure Diagnosis Wound #17: Same as Pre-Procedure Wound #21 Pre-procedure diagnosis of Wound #21 is a Sickle Cell Lesion located on the Right,Medial Ankle .Severity of Tissue Pre Debridement is: Fat layer exposed. There was a Excisional Skin/Subcutaneous Tissue Debridement with a total area of 13.68 sq cm performed by Fredirick Maudlin, MD. With the following instrument(s): Curette to remove Viable and Non-Viable tissue/material. Material removed includes Subcutaneous Tissue and Slough  and after achieving pain control using Other (benzocaine 20%). No specimens were taken. A time out was conducted at 10:24, prior to the start of the procedure. A Minimum amount of bleeding was controlled with Pressure. The procedure was tolerated well with a pain level of 0 throughout and a pain level of 0 following the procedure. Post Debridement Measurements: 5.7cm length x 2.4cm width x 0.1cm depth; 1.074cm^3 volume. Character of Wound/Ulcer Post Debridement is improved. Severity of Tissue Post Debridement is: Fat layer exposed. Post procedure Diagnosis Wound #21: Same as Pre-Procedure Plan Follow-up Appointments: Return Appointment in 1 week. - Dr. Celine Ahr - Room 3 - Bathing/ Shower/ Hygiene: May shower with protection but do not get wound dressing(s) wet. - Can get cast protector bags at [facility] or [facility] Edema Control - Lymphedema / SCD / Other: Elevate legs to the level of the heart or above for 30 minutes daily and/or when sitting, a frequency of: - throughout the day Avoid standing for long periods of time. Exercise regularly Additional Orders / Instructions: Follow Nutritious Diet WOUND #17: - Lower Leg Wound Laterality: Right, Lateral Cleanser: Soap and Water 1 x Per Week/30 Days Discharge Instructions: May shower and wash wound with dial antibacterial soap and water prior to dressing change. Cleanser: Wound Cleanser 1 x Per Week/30 Days Discharge Instructions: Cleanse the wound with wound cleanser prior to applying a clean dressing using gauze sponges, not tissue or cotton balls. Peri-Wound Care: Triamcinolone 15 (g) 1 x Per Week/30 Days Discharge Instructions: Use triamcinolone 15 (g) as directed Peri-Wound Care: Sween Lotion (Moisturizing lotion) 1 x Per Week/30 Days Discharge Instructions: Apply moisturizing lotion as directed Topical: Mupirocin Ointment 1 x Per Week/30 Days Discharge Instructions: Apply Mupirocin (Bactroban) as instructed Prim Dressing: Promogran Prisma  Matrix, 4.34 (sq in) (silver collagen) 1 x Per Week/30 Days ary Discharge Instructions: Moisten collagen with  saline or hydrogel Secondary Dressing: ABD Pad, 5x9 1 x Per Week/30 Days Discharge Instructions: Apply over primary dressing as directed. Secondary Dressing: Zetuvit Plus 4x8 in 1 x Per Week/30 Days Discharge Instructions: Apply over primary dressing as directed. Com pression Wrap: Kerlix Roll 4.5x3.1 (in/yd) 1 x Per Week/30 Days Discharge Instructions: Apply Kerlix and Coban compression as directed. Com pression Wrap: Coban Self-Adherent Wrap 4x5 (in/yd) 1 x Per Week/30 Days Discharge Instructions: Apply over Kerlix as directed. WOUND #21: - Ankle Wound Laterality: Right, Medial Cleanser: Soap and Water 1 x Per Week/30 Days Discharge Instructions: May shower and wash wound with dial antibacterial soap and water prior to dressing change. Cleanser: Wound Cleanser 1 x Per Week/30 Days Discharge Instructions: Cleanse the wound with wound cleanser prior to applying a clean dressing using gauze sponges, not tissue or cotton balls. Peri-Wound Care: Triamcinolone 15 (g) 1 x Per Week/30 Days Discharge Instructions: Use triamcinolone 15 (g) as directed Peri-Wound Care: Sween Lotion (Moisturizing lotion) 1 x Per Week/30 Days Discharge Instructions: Apply moisturizing lotion as directed Topical: Mupirocin Ointment 1 x Per Week/30 Days Discharge Instructions: Apply Mupirocin (Bactroban) as instructed Prim Dressing: Promogran Prisma Matrix, 4.34 (sq in) (silver collagen) 1 x Per Week/30 Days ary Discharge Instructions: Moisten collagen with saline or hydrogel Secondary Dressing: ABD Pad, 5x9 1 x Per Week/30 Days Discharge Instructions: Apply over primary dressing as directed. Secondary Dressing: Zetuvit Plus 4x8 in 1 x Per Week/30 Days Discharge Instructions: Apply over primary dressing as directed. Com pression Wrap: Kerlix Roll 4.5x3.1 (in/yd) 1 x Per Week/30 Days Discharge Instructions:  Apply Kerlix and Coban compression as directed. Com pression Wrap: Coban Self-Adherent Wrap 4x5 (in/yd) 1 x Per Week/30 Days Discharge Instructions: Apply over Kerlix as directed. 10/23/2021: Both wounds are little bit smaller again this week. Slough accumulation is the same. Surfaces remain fibrous and gritty. I used a curette to debride the slough from the wound surfaces. She has been approved for a trial product but it is not here this week. Hopefully will be available next week and we will see if this helps her wounds closed any more quickly. For now, we will continue using the topical mupirocin with Prisma silver collagen, Kerlix and Coban wraps. Follow-up in 1 week. Electronic Signature(s) Signed: 10/23/2021 11:35:50 AM By: Fredirick Maudlin MD FACS Entered By: Fredirick Maudlin on 10/23/2021 11:35:50 -------------------------------------------------------------------------------- HxROS Details Patient Name: Date of Service: [name], [name] 10/23/2021 10:15 A M Medical Record Number: [id number] Patient Account Number: [id number] Date of Birth/Sex: Treating RN: Dec 29, 1970 (51 y.o. [name] Primary Care Provider: [name] Other Clinician: Referring Provider: Treating Provider/Extender: Elyse Jarvis Weeks in Treatment: 27 Information Obtained From Patient Constitutional Symptoms (General Health) Medical History: Past Medical History Notes: H/O miscarriage Eyes Medical History: Negative for: Cataracts; Glaucoma; Optic Neuritis Ear/Nose/Mouth/Throat Medical History: Negative for: Chronic sinus problems/congestion; Middle ear problems Hematologic/Lymphatic Medical History: Positive for: Anemia; Sickle Cell Disease Negative for: Hemophilia; Human Immunodeficiency Virus; Lymphedema Respiratory Medical History: Negative for: Aspiration; Asthma; Chronic Obstructive Pulmonary Disease (COPD); Pneumothorax; Sleep Apnea;  Tuberculosis Cardiovascular Medical History: Negative for: Angina; Arrhythmia; Congestive Heart Failure; Coronary Artery Disease; Deep Vein Thrombosis; Hypertension; Hypotension; Myocardial Infarction; Peripheral Arterial Disease; Peripheral Venous Disease; Phlebitis; Vasculitis Past Medical History Notes: bradycardia Gastrointestinal Medical History: Negative for: Cirrhosis ; Colitis; Crohns; Hepatitis A; Hepatitis B; Hepatitis C Past Medical History Notes: cholilithiasis Endocrine Medical History: Negative for: Type I Diabetes; Type II Diabetes Genitourinary Medical History: Negative for: End Stage Renal  Disease Immunological Medical History: Negative for: Lupus Erythematosus; Raynauds; Scleroderma Integumentary (Skin) Medical History: Negative for: History of Burn Musculoskeletal Medical History: Negative for: Gout; Rheumatoid Arthritis; Osteoarthritis; Osteomyelitis Neurologic Medical History: Positive for: Neuropathy - right foot intermittant Negative for: Dementia; Quadriplegia; Paraplegia; Seizure Disorder Oncologic Medical History: Negative for: Received Chemotherapy; Received Radiation Psychiatric Medical History: Negative for: Anorexia/bulimia; Confinement Anxiety Immunizations Pneumococcal Vaccine: Received Pneumococcal Vaccination: No Implantable Devices None Hospitalization / Surgery History Type of Hospitalization/Surgery c section x2 left breast lumpectomy iandD right ankle with theraskin Family and Social History Cancer: No; Diabetes: Yes - Mother; Heart Disease: No; Hereditary Spherocytosis: No; Hypertension: No; Kidney Disease: No; Lung Disease: Yes - Mother; Seizures: No; Stroke: No; Thyroid Problems: No; Tuberculosis: No; Never smoker; Marital Status - Married; Alcohol Use: Never; Drug Use: No History; Caffeine Use: Daily; Financial Concerns: No; Food, Clothing or Shelter Needs: No; Support System Lacking: No; Transportation Concerns:  No Engineer, maintenance) Signed: 10/23/2021 12:24:33 PM By: Fredirick Maudlin MD FACS Signed: 10/23/2021 5:26:03 PM By: Dellie Catholic RN Entered By: Fredirick Maudlin on 10/23/2021 11:31:15 -------------------------------------------------------------------------------- SuperBill Details Patient Name: Date of Service: [name], [place] [name] 10/23/2021 Medical Record Number: [id number] Patient Account Number: [id number] Date of Birth/Sex: Treating RN: [birth year] (51 y.o. [name] Primary Care Provider: [name] Other Clinician: Referring Provider: Treating Provider/Extender: Elyse Jarvis Weeks in Treatment: 27 Diagnosis Coding ICD-10 Codes Code Description L97.818 Non-pressure chronic ulcer of other part of right lower leg with other specified severity 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 Facility Procedures CPT4 Code: [id number] Description: 75170 - DEB SUBQ TISSUE 20 SQ CM/< ICD-10 Diagnosis Description L97.818 Non-pressure chronic ulcer of other part of right lower leg with other specified Modifier: severity Quantity: 1 CPT4 Code: [id number] Description: 67591 - DEB SUBQ TISS EA ADDL 20CM ICD-10 Diagnosis Description L97.818 Non-pressure chronic ulcer of other part of right lower leg with other specified Modifier: severity Quantity: 1 Physician Procedures : CPT4 Code Description Modifier [id number] - WC PHYS LEVEL 3 - EST PT 25 ICD-10 Diagnosis Description L97.818 Non-pressure chronic ulcer of other part of right lower leg with other specified severity 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 Quantity: 1 : [id number] 03009 - WC PHYS SUBQ TISS 20 SQ CM ICD-10 Diagnosis Description L97.818 Non-pressure chronic ulcer of other part of right lower leg with other specified severity Quantity: 1 : [id number] 22633 - WC PHYS SUBQ TISS EA ADDL 20 CM ICD-10 Diagnosis Description L97.818 Non-pressure chronic ulcer of other part of right lower leg with other  specified severity Quantity: 1 Electronic Signature(s) Signed: 10/23/2021 11:36:08 AM By: Fredirick Maudlin MD FACS Entered By: Fredirick Maudlin on 10/23/2021 11:36:07

## 2021-10-25 ENCOUNTER — Ambulatory Visit (HOSPITAL_BASED_OUTPATIENT_CLINIC_OR_DEPARTMENT_OTHER): Payer: Medicaid Other | Admitting: General Surgery

## 2021-10-30 ENCOUNTER — Encounter (HOSPITAL_BASED_OUTPATIENT_CLINIC_OR_DEPARTMENT_OTHER): Payer: Medicaid Other | Admitting: General Surgery

## 2021-10-30 DIAGNOSIS — L97812 Non-pressure chronic ulcer of other part of right lower leg with fat layer exposed: Secondary | ICD-10-CM | POA: Diagnosis not present

## 2021-10-30 NOTE — Progress Notes (Signed)
AASIYAH, AUERBACH ([id number]) Visit Report for 10/23/2021 Arrival Information Details Patient Name: Date of Service: [name] 10/23/2021 10:15 A M Medical Record Number: [id number] Patient Account Number: [id number] Date of Birth/Sex: Treating RN: 09/04/1970 (51 y.o. [name] Primary Care Janete Quilling: [name] Other Clinician: Referring Marykay Mccleod: Treating Philip Kotlyar/Extender: Elyse Jarvis Weeks in Treatment: 27 Visit Information History Since Last Visit Added or deleted any medications: No Patient Arrived: [name] Any new allergies or adverse reactions: No Arrival Time: 09:58 Had a fall or experienced change in No Accompanied By: self activities of daily living that may affect Transfer Assistance: None risk of falls: Patient Requires Transmission-Based Precautions: No Signs or symptoms of abuse/neglect since last visito No Patient Has Alerts: No Hospitalized since last visit: No Implantable device outside of the clinic excluding No cellular tissue based products placed in the center since last visit: Has Dressing in Place as Prescribed: Yes Has Compression in Place as Prescribed: Yes Pain Present Now: No Electronic Signature(s) Signed: 10/30/2021 8:46:10 AM By: Erenest Blank Entered By: Erenest Blank on 10/23/2021 10:00:08 -------------------------------------------------------------------------------- Encounter Discharge Information Details Patient Name: Date of Service: [name], [name] 10/23/2021 10:15 A M Medical Record Number: [id number] Patient Account Number: [id number] Date of Birth/Sex: Treating RN: 05-22-1970 (51 y.o. [name] Primary Care Mellina Benison: [name] Other Clinician: Referring Fintan Grater: Treating Mylo Driskill/Extender: Elyse Jarvis Weeks in Treatment: 27 Encounter Discharge Information Items Post Procedure Vitals Discharge Condition: Stable Temperature (F): 98.6 Ambulatory Status:  Walker Pulse (bpm): 88 Discharge Destination: Home Respiratory Rate (breaths/min): 17 Transportation: Private Auto Blood Pressure (mmHg): 129/69 Accompanied By: self Schedule Follow-up Appointment: Yes Clinical Summary of Care: Patient Declined Electronic Signature(s) Signed: 10/23/2021 5:21:47 PM By: Adline Peals Entered By: Adline Peals on 10/23/2021 10:42:16 -------------------------------------------------------------------------------- Lower Extremity Assessment Details Patient Name: Date of Service: [name],  [name] 10/23/2021 10:15 A M Medical Record Number: [id number] Patient Account Number: [id number] Date of Birth/Sex: Treating RN: 10-Jan-1971 (51 y.o. [name] Primary Care Farha Dano: [name] Other Clinician: Referring Vikash Nest: Treating Demarquis Osley/Extender: Elyse Jarvis Weeks in Treatment: 27 Edema Assessment Assessed: [Left: No] [Right: Yes] Edema: [Left: Ye] [Right: s] Calf Left: Right: Point of Measurement: 33 cm From Medial Instep 30.9 cm Ankle Left: Right: Point of Measurement: 10 cm From Medial Instep 19.8 cm Vascular Assessment Pulses: Dorsalis Pedis Palpable: [Right:Yes] Electronic Signature(s) Signed: 10/23/2021 5:26:03 PM By: Dellie Catholic RN Signed: 10/30/2021 8:46:10 AM By: Erenest Blank Entered By: Erenest Blank on 10/23/2021 10:14:28 -------------------------------------------------------------------------------- Multi Wound Chart Details Patient Name: Date of Service: [name], [name] 10/23/2021 10:15 A M Medical Record Number: [id number] Patient Account Number: [id number] Date of Birth/Sex: Treating RN: 07-31-1970 (51 y.o. [name] Primary Care Myndi Wamble: [name] Other Clinician: Referring Anzley Dibbern: Treating Sadeel Fiddler/Extender: Elyse Jarvis Weeks in Treatment: 27 Vital Signs Height(in): 67 Pulse(bpm): 88 Weight(lbs): Blood Pressure(mmHg): 129/69 Body  Mass Index(BMI): Temperature(F): 98.6 Respiratory Rate(breaths/min): 17 Photos: [N/A:N/A] Right, Lateral Lower Leg Right, Medial Ankle N/A Wound Location: Gradually Appeared Gradually Appeared N/A Wounding Event: Sickle Cell Lesion Sickle Cell Lesion N/A Primary Etiology: N/A Venous Leg Ulcer N/A Secondary Etiology: Anemia, Sickle Cell Disease, Anemia, Sickle Cell Disease, N/A Comorbid History: Neuropathy Neuropathy 10/05/2012 06/26/2021 N/A Date Acquired: 27 17 N/A Weeks of Treatment: Open Open N/A Wound Status: No No N/A Wound Recurrence: Yes No N/A Clustered Wound: 5.9x4.3x0.1 5.7x2.4x0.1 N/A Measurements L x W x D (cm) 19.926 10.744 N/A A (cm) : rea  1.993 1.074 N/A Volume (cm) : 89.40% 62.90% N/A % Reduction in A rea: 89.40% 62.90% N/A % Reduction in Volume: Full Thickness Without Exposed Full Thickness Without Exposed N/A Classification: Support Structures Support Structures Medium Medium N/A Exudate A mount: Purulent Purulent N/A Exudate Type: yellow, brown, green yellow, brown, green N/A Exudate Color: Flat and Intact Distinct, outline attached N/A Wound Margin: Small (1-33%) Small (1-33%) N/A Granulation A mount: Pink, Hyper-granulation Pink N/A Granulation Quality: Large (67-100%) Large (67-100%) N/A Necrotic A mount: Fat Layer (Subcutaneous Tissue): Yes Fat Layer (Subcutaneous Tissue): Yes N/A Exposed Structures: Fascia: No Fascia: No Tendon: No Tendon: No Muscle: No Muscle: No Joint: No Joint: No Bone: No Bone: No Small (1-33%) Small (1-33%) N/A Epithelialization: Debridement - Excisional Debridement - Excisional N/A Debridement: Pre-procedure Verification/Time Out 10:24 10:24 N/A Taken: Other Other N/A Pain Control: Subcutaneous, Slough Subcutaneous, Slough N/A Tissue Debrided: Skin/Subcutaneous Tissue Skin/Subcutaneous Tissue N/A Level: 25.37 13.68 N/A Debridement A (sq cm): rea Curette Curette N/A Instrument: Minimum  Minimum N/A Bleeding: Pressure Pressure N/A Hemostasis A chieved: 0 0 N/A Procedural Pain: 0 0 N/A Post Procedural Pain: Procedure was tolerated well Procedure was tolerated well N/A Debridement Treatment Response: 5.9x4.3x0.1 5.7x2.4x0.1 N/A Post Debridement Measurements L x W x D (cm) 1.993 1.074 N/A Post Debridement Volume: (cm) Debridement Debridement N/A Procedures Performed: Treatment Notes Wound #17 (Lower Leg) Wound Laterality: Right, Lateral Cleanser Soap and Water Discharge Instruction: May shower and wash wound with dial antibacterial soap and water prior to dressing change. Wound Cleanser Discharge Instruction: Cleanse the wound with wound cleanser prior to applying a clean dressing using gauze sponges, not tissue or cotton balls. Peri-Wound Care Triamcinolone 15 (g) Discharge Instruction: Use triamcinolone 15 (g) as directed Sween Lotion (Moisturizing lotion) Discharge Instruction: Apply moisturizing lotion as directed Topical Mupirocin Ointment Discharge Instruction: Apply Mupirocin (Bactroban) as instructed Primary Dressing Promogran Prisma Matrix, 4.34 (sq in) (silver collagen) Discharge Instruction: Moisten collagen with saline or hydrogel Secondary Dressing ABD Pad, 5x9 Discharge Instruction: Apply over primary dressing as directed. Zetuvit Plus 4x8 in Discharge Instruction: Apply over primary dressing as directed. Secured With Compression Wrap Kerlix Roll 4.5x3.1 (in/yd) Discharge Instruction: Apply Kerlix and Coban compression as directed. Coban Self-Adherent Wrap 4x5 (in/yd) Discharge Instruction: Apply over Kerlix as directed. Compression Stockings Add-Ons Wound #21 (Ankle) Wound Laterality: Right, Medial Cleanser Soap and Water Discharge Instruction: May shower and wash wound with dial antibacterial soap and water prior to dressing change. Wound Cleanser Discharge Instruction: Cleanse the wound with wound cleanser prior to applying a clean  dressing using gauze sponges, not tissue or cotton balls. Peri-Wound Care Triamcinolone 15 (g) Discharge Instruction: Use triamcinolone 15 (g) as directed Sween Lotion (Moisturizing lotion) Discharge Instruction: Apply moisturizing lotion as directed Topical Mupirocin Ointment Discharge Instruction: Apply Mupirocin (Bactroban) as instructed Primary Dressing Promogran Prisma Matrix, 4.34 (sq in) (silver collagen) Discharge Instruction: Moisten collagen with saline or hydrogel Secondary Dressing ABD Pad, 5x9 Discharge Instruction: Apply over primary dressing as directed. Zetuvit Plus 4x8 in Discharge Instruction: Apply over primary dressing as directed. Secured With Compression Wrap Kerlix Roll 4.5x3.1 (in/yd) Discharge Instruction: Apply Kerlix and Coban compression as directed. Coban Self-Adherent Wrap 4x5 (in/yd) Discharge Instruction: Apply over Kerlix as directed. Compression Stockings Add-Ons Electronic Signature(s) Signed: 10/23/2021 11:29:56 AM By: Fredirick Maudlin MD FACS Signed: 10/23/2021 5:26:03 PM By: Dellie Catholic RN Entered By: Fredirick Maudlin on 10/23/2021 11:29:56 -------------------------------------------------------------------------------- Multi-Disciplinary Care Plan Details Patient Name: Date of Service: [name], [name] 10/23/2021 10:15 A M Medical  Record Number: [id number] Patient Account Number: [id number] Date of Birth/Sex: Treating RN: 01/10/1971 (51 y.o. [name] Primary Care Sabrina Keough: [name] Other Clinician: Referring Izael Bessinger: Treating Jersey Ravenscroft/Extender: Elyse Jarvis Weeks in Treatment: 27 Multidisciplinary Care Plan reviewed with physician Active Inactive Venous Leg Ulcer Nursing Diagnoses: Actual venous Insuffiency (use after diagnosis is confirmed) Knowledge deficit related to disease process and management Goals: Patient will maintain optimal edema control Date Initiated: 06/26/2021 Target  Resolution Date: 11/16/2021 Goal Status: Active Interventions: Assess peripheral edema status every visit. Compression as ordered Treatment Activities: Therapeutic compression applied : 06/26/2021 Notes: Wound/Skin Impairment Nursing Diagnoses: Impaired tissue integrity Goals: Patient/caregiver will verbalize understanding of skin care regimen Date Initiated: 04/17/2021 Target Resolution Date: 11/16/2021 Goal Status: Active Ulcer/skin breakdown will have a volume reduction of 30% by week 4 Date Initiated: 04/17/2021 Date Inactivated: 05/29/2021 Target Resolution Date: 05/15/2021 Goal Status: Met Ulcer/skin breakdown will have a volume reduction of 50% by week 8 Date Initiated: 05/29/2021 Date Inactivated: 06/26/2021 Target Resolution Date: 06/26/2021 Goal Status: Unmet Unmet Reason: venous reflux Interventions: Assess patient/caregiver ability to obtain necessary supplies Assess patient/caregiver ability to perform ulcer/skin care regimen upon admission and as needed Assess ulceration(s) every visit Provide education on ulcer and skin care Treatment Activities: Topical wound management initiated : 04/17/2021 Notes: 06/08/21: Left leg wounds greater than 30% volume reduction, right leg acute infection. Electronic Signature(s) Signed: 10/23/2021 5:21:47 PM By: Adline Peals Entered By: Adline Peals on 10/23/2021 10:22:07 -------------------------------------------------------------------------------- Pain Assessment Details Patient Name: Date of Service: [name], [name] 10/23/2021 10:15 A M Medical Record Number: [id number] Patient Account Number: [id number] Date of Birth/Sex: Treating RN: 01-05-1971 (51 y.o. [name] Primary Care Chevella Pearce: [name] Other Clinician: Referring Berdena Cisek: Treating Lyvia Mondesir/Extender: Elyse Jarvis Weeks in Treatment: 27 Active Problems Location of Pain Severity and Description of Pain Patient Has  Paino No Site Locations Pain Management and Medication Current Pain Management: Electronic Signature(s) Signed: 10/23/2021 5:26:03 PM By: Dellie Catholic RN Signed: 10/30/2021 8:46:10 AM By: Erenest Blank Entered By: Erenest Blank on 10/23/2021 10:00:32 -------------------------------------------------------------------------------- Patient/Caregiver Education Details Patient Name: Date of Service: [name], [name] 6/6/2023andnbsp10:15 [place] Record Number: [id number] Patient Account Number: [id number] Date of Birth/Gender: Treating RN: 05/26/1970 (51 y.o. [name] Primary Care Physician: [name] Other Clinician: Referring Physician: Treating Physician/Extender: Elyse Jarvis Weeks in Treatment: 27 Education Assessment Education Provided To: Patient Education Topics Provided Wound/Skin Impairment: Methods: Explain/Verbal Responses: Reinforcements needed, State content correctly Electronic Signature(s) Signed: 10/23/2021 5:21:47 PM By: Adline Peals Entered By: Adline Peals on 10/23/2021 10:22:23 -------------------------------------------------------------------------------- Wound Assessment Details Patient Name: Date of Service: [name], [name] 10/23/2021 10:15 A M Medical Record Number: [id number] Patient Account Number: [id number] Date of Birth/Sex: Treating RN: [birth year] (51 y.o. [name] Primary Care Tamarah Bhullar: [name] Other Clinician: Referring Pinkney Venard: Treating Adelbert Gaspard/Extender: Elyse Jarvis Weeks in Treatment: 27 Wound Status Wound Number: 17 Primary Etiology: Sickle Cell Lesion Wound Location: Right, Lateral Lower Leg Wound Status: Open Wounding Event: Gradually Appeared Comorbid History: Anemia, Sickle Cell Disease, Neuropathy Date Acquired: 10/05/2012 Weeks Of Treatment: 27 Clustered Wound: Yes Photos Wound Measurements Length: (cm) 5.9 Width: (cm)  4.3 Depth: (cm) 0.1 Area: (cm) 19.926 Volume: (cm) 1.993 % Reduction in Area: 89.4% % Reduction in Volume: 89.4% Epithelialization: Small (1-33%) Tunneling: No Undermining: No Wound Description Classification: Full Thickness Without Exposed Support Structures Wound Margin: Flat and Intact Exudate Amount: Medium Exudate Type: Purulent Exudate Color:  yellow, brown, green Foul Odor After Cleansing: No Slough/Fibrino Yes Wound Bed Granulation Amount: Small (1-33%) Exposed Structure Granulation Quality: Pink, Hyper-granulation Fascia Exposed: No Necrotic Amount: Large (67-100%) Fat Layer (Subcutaneous Tissue) Exposed: Yes Necrotic Quality: Adherent Slough Tendon Exposed: No Muscle Exposed: No Joint Exposed: No Bone Exposed: No Treatment Notes Wound #17 (Lower Leg) Wound Laterality: Right, Lateral Cleanser Soap and Water Discharge Instruction: May shower and wash wound with dial antibacterial soap and water prior to dressing change. Wound Cleanser Discharge Instruction: Cleanse the wound with wound cleanser prior to applying a clean dressing using gauze sponges, not tissue or cotton balls. Peri-Wound Care Triamcinolone 15 (g) Discharge Instruction: Use triamcinolone 15 (g) as directed Sween Lotion (Moisturizing lotion) Discharge Instruction: Apply moisturizing lotion as directed Topical Mupirocin Ointment Discharge Instruction: Apply Mupirocin (Bactroban) as instructed Primary Dressing Promogran Prisma Matrix, 4.34 (sq in) (silver collagen) Discharge Instruction: Moisten collagen with saline or hydrogel Secondary Dressing ABD Pad, 5x9 Discharge Instruction: Apply over primary dressing as directed. Zetuvit Plus 4x8 in Discharge Instruction: Apply over primary dressing as directed. Secured With Compression Wrap Kerlix Roll 4.5x3.1 (in/yd) Discharge Instruction: Apply Kerlix and Coban compression as directed. Coban Self-Adherent Wrap 4x5 (in/yd) Discharge Instruction:  Apply over Kerlix as directed. Compression Stockings Add-Ons Electronic Signature(s) Signed: 10/23/2021 5:21:47 PM By: Adline Peals Signed: 10/23/2021 5:26:03 PM By: Dellie Catholic RN Entered By: Adline Peals on 10/23/2021 10:21:39 -------------------------------------------------------------------------------- Wound Assessment Details Patient Name: Date of Service: [name], [name] 10/23/2021 10:15 A M Medical Record Number: [id number] Patient Account Number: [id number] Date of Birth/Sex: Treating RN: 1971/04/24 (51 y.o. [name] Primary Care Makalia Bare: [name] Other Clinician: Referring Tatsuya Okray: Treating Wileen Duncanson/Extender: Elyse Jarvis Weeks in Treatment: 27 Wound Status Wound Number: 21 Primary Etiology: Sickle Cell Lesion Wound Location: Right, Medial Ankle Secondary Etiology: Venous Leg Ulcer Wounding Event: Gradually Appeared Wound Status: Open Date Acquired: 06/26/2021 Comorbid History: Anemia, Sickle Cell Disease, Neuropathy Weeks Of Treatment: 17 Clustered Wound: No Photos Wound Measurements Length: (cm) 5.7 Width: (cm) 2.4 Depth: (cm) 0.1 Area: (cm) 10.744 Volume: (cm) 1.074 Wound Description Classification: Full Thickness Without Exposed Support Structures Wound Margin: Distinct, outline attached Exudate Amount: Medium Exudate Type: Purulent Exudate Color: yellow, brown, green Foul Odor After Cleansing: Slough/Fibrino % Reduction in Area: 62.9% % Reduction in Volume: 62.9% Epithelialization: Small (1-33%) Tunneling: No Undermining: No No Yes Wound Bed Granulation Amount: Small (1-33%) Exposed Structure Granulation Quality: Pink Fascia Exposed: No Necrotic Amount: Large (67-100%) Fat Layer (Subcutaneous Tissue) Exposed: Yes Necrotic Quality: Adherent Slough Tendon Exposed: No Muscle Exposed: No Joint Exposed: No Bone Exposed: No Treatment Notes Wound #21 (Ankle) Wound Laterality: Right,  Medial Cleanser Soap and Water Discharge Instruction: May shower and wash wound with dial antibacterial soap and water prior to dressing change. Wound Cleanser Discharge Instruction: Cleanse the wound with wound cleanser prior to applying a clean dressing using gauze sponges, not tissue or cotton balls. Peri-Wound Care Triamcinolone 15 (g) Discharge Instruction: Use triamcinolone 15 (g) as directed Sween Lotion (Moisturizing lotion) Discharge Instruction: Apply moisturizing lotion as directed Topical Mupirocin Ointment Discharge Instruction: Apply Mupirocin (Bactroban) as instructed Primary Dressing Promogran Prisma Matrix, 4.34 (sq in) (silver collagen) Discharge Instruction: Moisten collagen with saline or hydrogel Secondary Dressing ABD Pad, 5x9 Discharge Instruction: Apply over primary dressing as directed. Zetuvit Plus 4x8 in Discharge Instruction: Apply over primary dressing as directed. Secured With Compression Wrap Kerlix Roll 4.5x3.1 (in/yd) Discharge Instruction: Apply Kerlix and Coban compression as directed. Coban Self-Adherent Wrap 4x5 (  in/yd) Discharge Instruction: Apply over Kerlix as directed. Compression Stockings Add-Ons Electronic Signature(s) Signed: 10/23/2021 5:21:47 PM By: Adline Peals Signed: 10/23/2021 5:26:03 PM By: Dellie Catholic RN Entered By: Adline Peals on 10/23/2021 10:21:53 -------------------------------------------------------------------------------- Vitals Details Patient Name: Date of Service: [name], [name] 10/23/2021 10:15 A M Medical Record Number: [id number] Patient Account Number: [id number] Date of Birth/Sex: Treating RN: Jun 08, 1970 (51 y.o. [name] Primary Care Kloe Oates: [name] Other Clinician: Referring Aydrien Froman: Treating Satvik Parco/Extender: Elyse Jarvis Weeks in Treatment: 27 Vital Signs Time Taken: 10:00 Temperature (F): 98.6 Height (in): 67 Pulse (bpm):  88 Respiratory Rate (breaths/min): 17 Blood Pressure (mmHg): 129/69 Reference Range: 80 - 120 mg / dl Electronic Signature(s) Signed: 10/30/2021 8:46:10 AM By: Erenest Blank Entered By: Erenest Blank on 10/23/2021 10:00:26

## 2021-10-30 NOTE — Progress Notes (Signed)
[name], [name] ([id number]) Visit Report for 10/30/2021 Chief Complaint Document Details Patient Name: Date of Service: [name] [name] 10/30/2021 10:15 A M Medical Record Number: [id number] Patient Account Number: [id number] Date of Birth/Sex: Treating RN: 03-Aug-1970 (51 y.o. [name] Primary Care Provider: Cammie [name] Other Clinician: Referring Provider: Treating Provider/Extender: [name] Weeks in Treatment: 28 Information Obtained from: Patient Chief Complaint the patient is here for evaluation of her bilateral lower extremity [name] cell ulcers 04/17/2021; patient comes in for substantial wounds on the right and left lower leg Electronic Signature(s) Signed: 10/30/2021 11:08:49 AM By: [name] Maudlin MD FACS Entered By: [name] on 10/30/2021 11:08:49 -------------------------------------------------------------------------------- Debridement Details Patient Name: Date of Service: [name] [name], [name] 10/30/2021 10:15 A M Medical Record Number: [id number] Patient Account Number: [id number] Date of Birth/Sex: Treating RN: February 08, 1971 (51 y.o. [name] Primary Care Provider: Cammie [name] Other Clinician: Referring Provider: Treating Provider/Extender: [name] Weeks in Treatment: 28 Debridement Performed for Assessment: Wound #17 Right,Lateral Lower Leg Performed By: Physician [name] Maudlin, MD Debridement Type: Debridement Level of Consciousness (Pre-procedure): Awake and Alert Pre-procedure Verification/Time Out Yes - 11:03 Taken: Start Time: 11:03 Pain Control: Other : Benzocaine 20% T Area Debrided (L x W): otal 7 (cm) x 5 (cm) = 35 (cm) Tissue and other material debrided: Non-Viable, Slough, Subcutaneous, Slough Level: Skin/Subcutaneous Tissue Debridement Description: Excisional Instrument: Curette Bleeding: Minimum Hemostasis Achieved: Pressure End Time: 11:05 Procedural Pain:  0 Post Procedural Pain: 0 Response to Treatment: Procedure was tolerated well Level of Consciousness (Post- Awake and Alert procedure): Post Debridement Measurements of Total Wound Length: (cm) 7 Width: (cm) 5 Depth: (cm) 0.1 Volume: (cm) 2.749 Character of Wound/Ulcer Post Debridement: Improved Post Procedure Diagnosis Same as Pre-procedure Electronic Signature(s) Signed: 10/30/2021 12:13:44 PM By: [name] Maudlin MD FACS Signed: 10/30/2021 5:37:39 PM By: [name] Catholic RN Entered By: [name] on 10/30/2021 11:05:46 -------------------------------------------------------------------------------- Debridement Details Patient Name: Date of Service: [name] [name], [name] 10/30/2021 10:15 A M Medical Record Number: [id number] Patient Account Number: [id number] Date of Birth/Sex: Treating RN: 1970-10-24 (51 y.o. [name] Primary Care Provider: Cammie [name] Other Clinician: Referring Provider: Treating Provider/Extender: [name] Weeks in Treatment: 28 Debridement Performed for Assessment: Wound #21 Right,Medial Ankle Performed By: Physician [name] Maudlin, MD Debridement Type: Debridement Severity of Tissue Pre Debridement: Fat layer exposed Level of Consciousness (Pre-procedure): Awake and Alert Pre-procedure Verification/Time Out Yes - 11:03 Taken: Start Time: 11:03 Pain Control: Other : Benzocaine 20% T Area Debrided (L x W): otal 5.4 (cm) x 3 (cm) = 16.2 (cm) Tissue and other material debrided: Non-Viable, Slough, Subcutaneous, Slough Level: Skin/Subcutaneous Tissue Debridement Description: Excisional Instrument: Curette Bleeding: Minimum Hemostasis Achieved: Pressure End Time: 11:05 Procedural Pain: 0 Post Procedural Pain: 0 Response to Treatment: Procedure was tolerated well Level of Consciousness (Post- Awake and Alert procedure): Post Debridement Measurements of Total Wound Length: (cm) 5.4 Width: (cm) 3 Depth:  (cm) 0.1 Volume: (cm) 1.272 Character of Wound/Ulcer Post Debridement: Improved Severity of Tissue Post Debridement: Fat layer exposed Post Procedure Diagnosis Same as Pre-procedure Electronic Signature(s) Signed: 10/30/2021 12:13:44 PM By: [name] Maudlin MD FACS Signed: 10/30/2021 5:37:39 PM By: [name] Catholic RN Entered By: [name] on 10/30/2021 11:36:54 -------------------------------------------------------------------------------- HPI Details Patient Name: Date of Service: [name] [name], [name] 10/30/2021 10:15 A M Medical Record Number: [id number] Patient Account Number: [id number] Date of Birth/Sex: Treating RN: 1971-04-28 (51 y.o. [name] Primary Care Provider: Smith [name],  HYIFOYD Other Clinician: Referring Provider: Treating Provider/Extender: [name] Weeks in Treatment: 28 History of Present Illness Location: medial and lateral ankle region on the right and left medial malleolus Quality: Patient reports experiencing a shooting pain to affected area(s). Severity: Patient states wound(s) are getting worse. Duration: right lower extremity bimalleolar ulcers have been present for approximately 2 years; the rright meedial malleolus ulcer has been there proximally 6 months Timing: Pain in wound is constant (hurts all the time) Context: The wound would happen gradually ssociated Signs and Symptoms: Patient reports having increase discharge. 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 Leland [name] for a surgical debridement as I was unable to do anything in the office due to excruciating pain. At that stage she was referred from the plastic surgery service to dermatology who treated her for a skin infection with doxycycline and then Levaquin and a local antibiotic ointment. I understand the patient has since  developed ulceration on the left ankle both medial and lateral and was now referred back to the wound center as dermatology has finished the management. 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 She has been visiting the wound center [facility] and was seen by Dr. Dellia Nims and Dr. Leland [name]. after prolonged conservator therapy between July 2016 and January 2017. She had been seen by the plastic surgeon and taken to the OR for debridement and application of Theraskin. She had 3 applications of Theraskin and was then treated with collagen. Prior to that she had a history of similar problems in 2014 and was treated conservatively. Had a reflux study done for the right lower extremity in August 2016 without reflux or DVT . Past medical history significant for [name] cell disease, anemia, leg ulcers, cholelithiasis,and has never been a smoker. Once the patient was discharged on the wound center she says within 2 or 3 weeks the problems recurred and she has been treating it conservatively. since I saw her 3 weeks ago at [facility] she has been unable to get her dressing material but has completed a course of doxycycline. 6/7/ 2017 -- lower extremity venous duplex reflux evaluation was done No evidence of SVT or DVT in the RLL. No venous incompetence in the RLL. No further vascular workup is indicated at this time. She was seen by Dr. [name], on 10/04/2015. She agreed with the plan of taking her to the OR for debridement and application of theraskin and would also take biopsies to rule out pyoderma gangrenosum. Follow-up note dated May 31 received and she was status post application of Theraskin to multiple ulcers around the right ankle. Pathology did not show evidence of malignancy  or pyoderma gangrenosum. She would continue to see as in the wound clinic for further care and see Dr. Leland [name] as needed. The patient brought the biopsy report and it was consistent with stasis ulcer no evidence of malignancy and the comment was that there was some adjacent neovascularization, fibrosis and patchy perivascular chronic inflammation. 11/15/2015 -- today we applied her first application of Theraskin 11/30/15; TheraSkin #2 12/13/2015 -- she is having a lot of pain locally and is here for possible application of  a theraskin today. 01/16/2016 -- the patient has significant pain and has noticed despite in spite of all local care and oral pain medication. It is impossible to debride her in the office. 02/06/2016 -- I do not see any notes from Dr. Iran Planas( the patient has not made a call to the office know as she heard from them) and the only visit to recently was with her PCP Dr. Danella Penton -- I saw her on 01/16/2016 and prescribed 90 tablets of oxycodone 10 mg and did lab work and screening for HIV. the HIV was negative and hemoglobin was 6.3 with a WBC count of 14.9 and hematocrit of 17.8 with platelets of 561. reticulocyte count was 15.5% READMISSION: 07/10/2016- The patient is here for readmission for bilateral lower extremity ulcers in the presence of [name] cell. The bimalleolar ulcers to the right lower extremity have been present for approximately 2 years, the left medial malleolus ulcer has been present approximately 6 months. She has followed with Dr.Thimmappa in the past and has had a total of 3 applications of Theraskin (01/2015, 09/2015, 06/17/16). She has also followed with Dr. Con Memos here in the clinic and has received 2 applications of TheraSkin (11/10/15, 11/30/15). The patient does experience chronic, and is not amenable to debridement. She had a [name] cell crisis in December 2017, prior to that has been several years. She is not currently on any antibiotic therapy and has  not been treated with any recently. 07/17/2016 -- was seen by Dr. Iran Planas of plastic surgery who saw her 2 weeks postop application of Theraskin #3. She had removed her dressing and asked her to apply silver alginate on alternate days and follow-up back with the wound center. Future debridements and application of skin substitute would have to be done in the hospital due to her high risk for anesthesia. READMISSION 04/17/2021 Patient is now a 51 year old woman that we have had in this clinic for a prolonged period of time and 2016-2017 and then again for 2 visits in February 2018. At that point she had wounds on the right lower leg predominantly medial. She had also been seen by plastic surgery Dr. Leland [name] who I believe took her to the OR for operative debridement and application of TheraSkin in 2017. After she left our clinic she was followed for a very prolonged period of time in the wound care center in [facility] who then referred her ultimately to [facility] where she was seen by Dr. Vernona Rieger. Again taken her to the OR for skin grafting which apparently did not take. She had multiple other attempts at dressings although I have not really looked over all of these notes in great detail. She has not been seen in a wound care center in about a year. She states over the last year in addition to her right lower leg she has developed wounds on the left lower leg quite extensive. She is using Xeroform to all of these wounds without really any improvement. She also has Medicaid which does not cover wound products. The patient has had vascular work-ups in the past including most recently on 03/28/2021 showing biphasic waveforms on the right triphasic at the PTA and biphasic at the dorsalis pedis on the left. She was unable to tolerate any degree of compression to do ABIs. Unfortunately TBI's were also not done. She had venous reflux studies done in 2017. This did not show any evidence of a DVT or SVT and no  venous incompetence was noted in the right  leg at the time this was the only side with the wound As noted I did not look all over her old records. She apparently had a course of HBO and [facility] although I am not sure what the indication would have been. In any case she developed seizures and terminated treatment earlier. She is generally much more disabled than when we last saw her in clinic. She can no longer walk pretty much wheelchair-bound because predominantly of pain in the left hip. 04/24/2021; the patient tolerated the wraps we put on. We used Santyl and Hydrofera Blue under compression. I brought her back for a nurse visit for a change in dressing. With Medicaid we will have a hard time getting anything paid for and hence the need for compression. She arrives in clinic with all the wounds looking somewhat better in terms of surface 12/20; circumferential wound on the right from the lateral to the medial. She has open areas on the left medial and left lateral x2 on all of this with the same surface. This does not look completely healthy although she does have some epithelialization. She is not complaining of a lot of pain which is unusual for her [name] ulcers. I have not looked over her extensive records from [facility]. She had recent arterial studies and has a history of venous reflux studies I will need to look these over although I do not believe she has significant arterial disease 2023 05/22/2021; patient's wound areas measure slightly smaller. Still a lot of drainage coming from the right we have been using Hydrofera Blue and Santyl with some improvement in the wound surfaces. 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 This was in November and showed biphasic and triphasic waveforms but she could not tolerate ABIs because of pressure and unfortunately TBI's  were not done. She has not had recent venous reflux studies that I can see 1/10; not much change about the same surface area. This has a yellowish surface to it very gritty. We have been using Santyl and Hydrofera Blue for a prolonged period. Culture I did last week showed methicillin sensitive staph aureus "rare". Our intake nurse reports greenish drainage which may be the Hydrofera Blue itself 1/17; wounds are continue to measure smaller although I am not sure about the accuracy here. Especially the areas on the right are covered in what looks to be a nonviable surface although she does have some epithelialization. Similarly she has areas on the left medial and left lateral ankle area which appear to have a better surface and perhaps are slightly smaller. We have been using Santyl and Hydrofera Blue. She cannot tolerate mechanical debridements She went for her reflux studies which showed significant reflux at the greater saphenous vein at the saphenofemoral junction as well as the greater saphenous vein in the proximal calf on the left she had reflux in the thigh and the common femoral vein and supra vein Fishel vein reflux in the greater saphenous vein. I will have vein and vascular look at this. My thoughts have been that these are likely [name] wounds. I looked through her old records from [facility] wound care center and then when she graduated to [facility] wound care center where she saw Dr. Zigmund Daniel and Dr. Vernona Rieger. Although I can see she had reflux studies done I do not see that she actually  saw a vein and vascular. I went over the fact that she had operative debridements and actual skin grafting that did not take. I do not think these wounds have ever really progressed towards healing 1/31;Substantial wounds on the right ankle area. Hyper granulated very gritty adherent debris on the surface. 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 On the left she has no DVT or SVT deep vein reflux in the common femoral vein. Superficial vein reflux in the greater saphenous vein on although the vein diameter is not really all that large. I do not think there is anything that can be done with these although I am going to send her for consultation to vein and vascular. 2/7; Wound exam; substantial wound area on the right posterior ankle area and areas on the left medial ankle and left lateral ankle. I was able to debride the left medial ankle last week fairly aggressively and it is back this week to a completely nonviable surface She will see vascular surgery this Friday and I would like them to review the venous studies and also any comments on her arterial status. If they do not see an issue here I am going to refer her to plastic surgery for an operative debridement perhaps intraoperative ACell or Integra. Eventually she will require a deep tissue culture again 2/14; substantial wound area on the right posterior ankle, medial ankle. We have been using silver alginate The patient was seen by vein and vascular she had both venous reflux studies and arterial studies. 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 She had no evidence of superficial venous thrombosis. She did have superficial vein reflux at the saphenofemoral junction and the greater saphenous vein. On the left no evidence of a DVT no evidence of superficial venous throat thrombosis she did have deep vein reflux in the common femoral vein and superficial vein reflux in the greater saphenous vein but these  were not felt to be amenable to ablation. In terms of arterial studies she had triphasic and wife biphasic wave waveforms bilaterally not felt to have a significant arterial issue. I do not get the feeling that they felt that any part of her nonhealing wounds were related to either arterial or venous issues. They did note that she had venous reflux at the right at the [facility] and [facility]. And also on the left there were reflux in the deep system at the common femoral vein and greater saphenous vein in the proximal thigh. Nothing amenable to ablation. 2/20; she is making some decent progress on the right where there is nice skin between the 2 open areas on the right ankle. The surfaces here do not look viable yet there is some surrounding epithelialization. She still has a small area on the left medial ankle area. Hyper-granulated Jody's away always 2/28 patient has an appointment with plastic surgery on 3/8. We will see her back on 3/9. She may have to call us to get the area redressed. We've been using Santyl under silver alginate. We made a  nice improvement on the left medial ankle. The larger wounds on the right also looks somewhat better in terms of epithelialization although I think they could benefit from an aggressive debridement if plastic surgery would be willing to do that. Perhaps placement of Integra or a cell 07/26/2021: She saw Dr. Claudia Desanctis yesterday. He raised the question as to whether or not this might be pyoderma and wanted to wait until that question was answered by dermatology before proceeding with any sort of operative debridement. We have continue to use Santyl under silver alginate with Kerlix and Coban wraps. Overall, her wounds appear to be continuing to contract and epithelialize, with some granulation tissue present. There continues to be some slough on all wound surfaces. 08/09/2021: She has not been able to get an appointment with dermatology because apparently the offices in [place] do not accept Medicaid. She is looking into whether or not she can be seen at the main [facility] dermatology clinic. This is necessary because plastic surgery is concerned that her wounds might represent pyoderma and they did not want to do any procedure until that was clarified. We have been using Santyl under silver alginate with Kerlix and Coban wraps. Today, there was a greater amount of drainage on her dressings with a slight green discoloration and significant odor. Despite this, her wounds continue to contract and epithelialize. There is pale granulation tissue present and actually, on the left medial ankle, the granulation tissue is a bit hypertrophic. 08/16/2021: Last week, I took a culture and this grew back rare methicillin-resistant Staph aureus and rare corynebacterium. The MRSA was sensitive to gentamicin which we began applying topically on an empiric basis. This week, her wounds are a bit smaller and the drainage and odor are less. Her primary care provider is working on assisting the patient with a dermatology evaluation. She has been in silver alginate over the gentamicin that was started last week along with Kerlix and Coban wraps. 08/23/2021: Because she has Medicaid, we have been unable to get her into see any dermatologist in the [place] to rule out pyoderma gangrenosum, which was a requirement from plastic surgery prior to any sort of debridement and grafting. Despite this, however, all of her wounds continue to get smaller. The wound on her left medial ankle is nearly closed. There is no odor from the wounds, although she still accumulates a modest amount of drainage on her dressings. 08/30/2021: The lateral right ankle wound and the medial left ankle wound are a bit smaller today. The medial right ankle wound is about the same size. They are less tender. We have still been unable to get her into dermatology. 09/06/2021: All of the wounds are about the same  size today. She continues to endorse minimal pain. I communicated with Dr. Claudia Desanctis in plastic surgery regarding our issues getting a dermatology appointment; he was out of town but indicated that he would look into perhaps performing the biopsy in his office and will have his office contact her. 09/14/2021: The patient has an appointment in dermatology, but it is not until October. Her wounds are roughly the same; she continues to have very thick purulent-looking drainage on her dressings. 09/20/2021: The left medial wound is nearly closed and just has a bit of accumulated eschar on the surface. The right medial and lateral ankle wounds are perhaps a little bit smaller. They continue to have a very pale surface with accumulation of thin slough. PCR culture done last week  returned with MRSA but fairly low levels. I did not think [employer] was indicated based on this. She is getting topical mupirocin with Prisma silver collagen. 10/04/2021: The patient was not seen in clinic last week due to childcare coverage issues. In the interim, the left medial leg wound has closed. The right sided leg wounds are smaller. There is more granulation tissue coming through, particularly on the lateral wound. The surface remains somewhat gritty. We have been applying topical mupirocin and Prisma silver collagen. 10/11/2021: The left medial leg wound remains closed. She does complain of some anesthetic sensation to the area. Both of the right-sided leg wounds are smaller but still have accumulated slough. 10/18/2021: Both right-sided leg wounds are minimally smaller this week. She still continues to accumulate slough and has thick drainage on her dressings. 10/23/2021: Both wounds continue to contract. There is still slough buildup. She has been approved for a keratin-based skin substitute trial product but it will not be available until next week. 10/30/2021: The wounds are about the same to perhaps slightly smaller. There is still  continued slough buildup. Unfortunately, the rep for the keratin based product did not show up today and did not answer his phone when called. Electronic Signature(s) Signed: 10/30/2021 11:09:20 AM By: [name] Maudlin MD FACS Entered By: [name] on 10/30/2021 11:09:20 -------------------------------------------------------------------------------- Physical Exam Details Patient Name: Date of Service: [name] [name], [name] 10/30/2021 10:15 A M Medical Record Number: [id number] Patient Account Number: [id number] Date of Birth/Sex: Treating RN: July 26, 1970 (51 y.o. [name] Primary Care Provider: Cammie [name] Other Clinician: Referring Provider: Treating Provider/Extender: [name] Weeks in Treatment: 28 Constitutional . . . . No acute distress.Marland Kitchen Respiratory Normal work of breathing on room air.. Notes 10/30/2021: The wounds are roughly the same size this week with the same amount of slough accumulation as usual. The surface is the same. Electronic Signature(s) Signed: 10/30/2021 11:15:38 AM By: [name] Maudlin MD FACS Entered By: [name] on 10/30/2021 11:15:38 -------------------------------------------------------------------------------- Physician Orders Details Patient Name: Date of Service: [name] [name], [name] 10/30/2021 10:15 A M Medical Record Number: [id number] Patient Account Number: [id number] Date of Birth/Sex: Treating RN: 04-05-1971 (51 y.o. 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 - Dr. Celine Ahr - Room 3 Return A Bathing/ Shower/  Hygiene May shower with protection but do not get wound dressing(s) wet. - Can get cast protector bags at [facility] or [facility] Edema Control - Lymphedema / SCD / Other Elevate legs to the level of the heart or above for 30 minutes daily and/or when sitting, a frequency of: - throughout the day Avoid standing for long periods of time. Exercise regularly Additional Orders / Instructions Follow Nutritious Diet Wound Treatment Wound #17 - Lower Leg Wound Laterality: Right, Lateral Cleanser: Soap and Water 1 x Per Week/30 Days Discharge Instructions: May shower and wash wound with dial antibacterial soap and water prior to dressing change. Cleanser: Wound Cleanser 1 x Per Week/30 Days Discharge Instructions: Cleanse the wound with wound cleanser prior to applying a clean dressing using gauze sponges, not tissue or cotton balls. Peri-Wound Care: Triamcinolone 15 (g) 1 x Per Week/30 Days Discharge Instructions: Use triamcinolone 15 (g) as  directed Peri-Wound Care: Sween Lotion (Moisturizing lotion) 1 x Per Week/30 Days Discharge Instructions: Apply moisturizing lotion as directed Topical: Mupirocin Ointment 1 x Per Week/30 Days Discharge Instructions: Apply Mupirocin (Bactroban) as instructed Prim Dressing: Promogran Prisma Matrix, 4.34 (sq in) (silver collagen) 1 x Per Week/30 Days ary Discharge Instructions: Moisten collagen with saline or hydrogel Secondary Dressing: ABD Pad, 5x9 1 x Per Week/30 Days Discharge Instructions: Apply over primary dressing as directed. Secondary Dressing: Zetuvit Plus 4x8 in 1 x Per Week/30 Days Discharge Instructions: Apply over primary dressing as directed. Compression Wrap: Kerlix Roll 4.5x3.1 (in/yd) 1 x Per Week/30 Days Discharge Instructions: Apply Kerlix and Coban compression as directed. Compression Wrap: Coban Self-Adherent Wrap 4x5 (in/yd) 1 x Per Week/30 Days Discharge Instructions: Apply over Kerlix as directed. Wound #21 - Ankle Wound Laterality:  Right, Medial Cleanser: Soap and Water 1 x Per Week/30 Days Discharge Instructions: May shower and wash wound with dial antibacterial soap and water prior to dressing change. Cleanser: Wound Cleanser 1 x Per Week/30 Days Discharge Instructions: Cleanse the wound with wound cleanser prior to applying a clean dressing using gauze sponges, not tissue or cotton balls. Peri-Wound Care: Triamcinolone 15 (g) 1 x Per Week/30 Days Discharge Instructions: Use triamcinolone 15 (g) as directed Peri-Wound Care: Sween Lotion (Moisturizing lotion) 1 x Per Week/30 Days Discharge Instructions: Apply moisturizing lotion as directed Topical: Mupirocin Ointment 1 x Per Week/30 Days Discharge Instructions: Apply Mupirocin (Bactroban) as instructed Prim Dressing: Promogran Prisma Matrix, 4.34 (sq in) (silver collagen) 1 x Per Week/30 Days ary Discharge Instructions: Moisten collagen with saline or hydrogel Secondary Dressing: ABD Pad, 5x9 1 x Per Week/30 Days Discharge Instructions: Apply over primary dressing as directed. Secondary Dressing: Zetuvit Plus 4x8 in 1 x Per Week/30 Days Discharge Instructions: Apply over primary dressing as directed. Compression Wrap: Kerlix Roll 4.5x3.1 (in/yd) 1 x Per Week/30 Days Discharge Instructions: Apply Kerlix and Coban compression as directed. Compression Wrap: Coban Self-Adherent Wrap 4x5 (in/yd) 1 x Per Week/30 Days Discharge Instructions: Apply over Kerlix as directed. Electronic Signature(s) Signed: 10/30/2021 12:13:44 PM By: [name] Maudlin MD FACS Entered By: [name] on 10/30/2021 11:16:00 -------------------------------------------------------------------------------- Problem List Details Patient Name: Date of Service: [name] [name], [name] 10/30/2021 10:15 A M Medical Record Number: [id number] Patient Account Number: [id number] Date of Birth/Sex: Treating RN: 05/26/1970 (51 y.o. 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 04/17/2021 No Yes Inactive Problems ICD-10 Code Description Active Date Inactive Date L97.828 Non-pressure chronic ulcer of other part of left lower leg with other specified severity 04/17/2021 04/17/2021 Resolved Problems Electronic Signature(s) Signed: 10/30/2021 11:08:29 AM By: [name] Maudlin MD FACS Entered By: [name] on 10/30/2021 11:08:28 -------------------------------------------------------------------------------- Progress Note Details Patient Name: Date of Service: [name] [name], [name] 10/30/2021 10:15 A M Medical Record Number: [id number] Patient Account Number: [id number] Date of Birth/Sex: Treating RN: 1970/06/23 (51 y.o. [name] Primary Care Provider: Cammie [name] Other Clinician: Referring Provider: Treating Provider/Extender: [name] Weeks in Treatment: 28 Subjective Chief Complaint Information obtained from Patient the patient is here for evaluation of her bilateral lower extremity [name] cell ulcers 04/17/2021; patient comes in for substantial wounds on the right and left lower leg History of  Present Illness (HPI) The following HPI elements were documented for the patient's wound: Location: medial and lateral ankle region on the right and left medial malleolus Quality: Patient reports experiencing a shooting pain to affected area(s). Severity: Patient states wound(s) are getting worse. Duration: right lower extremity bimalleolar ulcers have been present for approximately 2 years; the rright meedial malleolus ulcer has been there proximally 6 months Timing: Pain in  wound is constant (hurts all the time) Context: The wound would happen gradually Associated Signs and Symptoms: Patient reports having increase discharge. 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 Leland [name] for a surgical debridement as I was unable to do anything in the office due to excruciating pain. At that stage she was referred from the plastic surgery service to dermatology who treated her for a skin infection with doxycycline and then Levaquin and a local antibiotic ointment. I understand the patient has since developed ulceration on the left ankle both medial and lateral and was now referred back to the wound center as dermatology has finished the management. 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 She has been visiting the wound center [facility] and was seen by Dr. Dellia Nims and Dr. Leland [name]. after prolonged conservator therapy between July 2016 and January 2017. She had been seen by the plastic surgeon and taken to the OR for debridement and application of Theraskin. She had 3 applications of Theraskin and was then treated with collagen. Prior to that she had a history of similar problems in 2014 and was treated conservatively. Had a reflux study done for the right lower extremity in August 2016 without reflux or DVT . Past medical history significant for [name] cell disease, anemia, leg ulcers, cholelithiasis,and has never been a smoker. Once the patient was discharged on the wound center she says within 2 or 3 weeks the problems recurred and she has been treating it conservatively. since I saw her 3 weeks ago at [facility] she has been unable to  get her dressing material but has completed a course of doxycycline. 6/7/ 2017 -- lower extremity venous duplex reflux evaluation was done oo No evidence of SVT or DVT in the RLL. No venous incompetence in the RLL. No further vascular workup is indicated at this time. She was seen by Dr. [name], on 10/04/2015. She agreed with the plan of taking her to the OR for debridement and application of theraskin and would also take biopsies to rule out pyoderma gangrenosum. Follow-up note dated May 31 received and she was status post application of Theraskin to multiple ulcers around the right ankle. Pathology did not show evidence of malignancy or pyoderma gangrenosum. She would continue to see as in the wound clinic for further care and see Dr. Leland [name] as needed. The patient brought the biopsy report and it was consistent with stasis ulcer no evidence of malignancy and the comment was that there was some adjacent neovascularization, fibrosis and patchy perivascular chronic inflammation. 11/15/2015 -- today we applied her first application of Theraskin 11/30/15; TheraSkin #2 12/13/2015 -- she is having a lot of pain locally and is here for possible application of a theraskin today. 01/16/2016 -- the patient has  significant pain and has noticed despite in spite of all local care and oral pain medication. It is impossible to debride her in the office. 02/06/2016 -- I do not see any notes from Dr. Iran Planas( the patient has not made a call to the office know as she heard from them) and the only visit to recently was with her PCP Dr. Danella Penton -- I saw her on 01/16/2016 and prescribed 90 tablets of oxycodone 10 mg and did lab work and screening for HIV. the HIV was negative and hemoglobin was 6.3 with a WBC count of 14.9 and hematocrit of 17.8 with platelets of 561. reticulocyte count was 15.5% READMISSION: 07/10/2016- The patient is here for readmission for bilateral lower extremity ulcers in the  presence of [name] cell. The bimalleolar ulcers to the right lower extremity have been present for approximately 2 years, the left medial malleolus ulcer has been present approximately 6 months. She has followed with Dr.Thimmappa in the past and has had a total of 3 applications of Theraskin (01/2015, 09/2015, 06/17/16). She has also followed with Dr. Con Memos here in the clinic and has received 2 applications of TheraSkin (11/10/15, 11/30/15). The patient does experience chronic, and is not amenable to debridement. She had a [name] cell crisis in December 2017, prior to that has been several years. She is not currently on any antibiotic therapy and has not been treated with any recently. 07/17/2016 -- was seen by Dr. Iran Planas of plastic surgery who saw her 2 weeks postop application of Theraskin #3. She had removed her dressing and asked her to apply silver alginate on alternate days and follow-up back with the wound center. Future debridements and application of skin substitute would have to be done in the hospital due to her high risk for anesthesia. READMISSION 04/17/2021 Patient is now a 51 year old woman that we have had in this clinic for a prolonged period of time and 2016-2017 and then again for 2 visits in February 2018. At that point she had wounds on the right lower leg predominantly medial. She had also been seen by plastic surgery Dr. Leland [name] who I believe took her to the OR for operative debridement and application of TheraSkin in 2017. After she left our clinic she was followed for a very prolonged period of time in the wound care center in [facility] who then referred her ultimately to [facility] where she was seen by Dr. Vernona Rieger. Again taken her to the OR for skin grafting which apparently did not take. She had multiple other attempts at dressings although I have not really looked over all of these notes in great detail. She has not been seen in a wound care center in about a year. She states  over the last year in addition to her right lower leg she has developed wounds on the left lower leg quite extensive. She is using Xeroform to all of these wounds without really any improvement. She also has Medicaid which does not cover wound products. The patient has had vascular work-ups in the past including most recently on 03/28/2021 showing biphasic waveforms on the right triphasic at the PTA and biphasic at the dorsalis pedis on the left. She was unable to tolerate any degree of compression to do ABIs. Unfortunately TBI's were also not done. She had venous reflux studies done in 2017. This did not show any evidence of a DVT or SVT and no venous incompetence was noted in the right leg at the time this was the only  side with the wound As noted I did not look all over her old records. She apparently had a course of HBO and [facility] although I am not sure what the indication would have been. In any case she developed seizures and terminated treatment earlier. She is generally much more disabled than when we last saw her in clinic. She can no longer walk pretty much wheelchair-bound because predominantly of pain in the left hip. 04/24/2021; the patient tolerated the wraps we put on. We used Santyl and Hydrofera Blue under compression. I brought her back for a nurse visit for a change in dressing. With Medicaid we will have a hard time getting anything paid for and hence the need for compression. She arrives in clinic with all the wounds looking somewhat better in terms of surface 12/20; circumferential wound on the right from the lateral to the medial. She has open areas on the left medial and left lateral x2 on all of this with the same surface. This does not look completely healthy although she does have some epithelialization. She is not complaining of a lot of pain which is unusual for her [name] ulcers. I have not looked over her extensive records from [facility]. She had recent arterial studies and  has a history of venous reflux studies I will need to look these over although I do not believe she has significant arterial disease 2023 05/22/2021; patient's wound areas measure slightly smaller. Still a lot of drainage coming from the right we have been using Hydrofera Blue and Santyl with some improvement in the wound surfaces. 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 This was in November and showed biphasic and triphasic waveforms but she could not tolerate ABIs because of pressure and unfortunately TBI's were not done. She has not had recent venous reflux studies that I can see 1/10; not much change about the same surface area. This has a yellowish surface to it very gritty. We have been using Santyl and Hydrofera Blue for a prolonged period. Culture I did last week showed methicillin sensitive staph aureus "rare". Our intake nurse reports greenish drainage which may be the Hydrofera Blue itself 1/17; wounds are continue to measure smaller although I am not sure about the accuracy here. Especially the areas on the right are covered in what looks to be a nonviable surface although she does have some epithelialization. Similarly she has areas on the left medial and left lateral ankle area which appear to have a better surface and perhaps are slightly smaller. We have been using Santyl and Hydrofera Blue. She cannot tolerate mechanical debridements She went for her reflux studies which showed significant reflux at the greater saphenous vein at the saphenofemoral junction as well as the greater saphenous vein in the proximal calf on the left she had reflux in the thigh and the common femoral vein and supra vein Fishel vein reflux in the greater saphenous vein. I will have vein and vascular look at this. My thoughts have been that these are likely [name] wounds. I  looked through her old records from [facility] wound care center and then when she graduated to [facility] wound care center where she saw Dr. Zigmund Daniel and Dr. Vernona Rieger. Although I can see she had reflux studies done I do not see that she actually saw a vein and vascular. I went  over the fact that she had operative debridements and actual skin grafting that did not take. I do not think these wounds have ever really progressed towards healing 1/31;Substantial wounds on the right ankle area. Hyper granulated very gritty adherent debris on the surface. 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 On the left she has no DVT or SVT deep vein reflux in the common femoral vein. Superficial vein reflux in the greater saphenous vein on although the vein diameter is not really all that large. I do not think there is anything that can be done with these although I am going to send her for consultation to vein and vascular. 2/7; Wound exam; substantial wound area on the right posterior ankle area and areas on the left medial ankle and left lateral ankle. I was able to debride the left medial ankle last week fairly aggressively and it is back this week to a completely nonviable surface She will see vascular surgery this Friday and I would like them to review the venous studies and also any comments on her arterial status. If they do not see an issue here I am going to refer her to plastic surgery for an operative debridement perhaps intraoperative ACell or Integra. Eventually she will require a deep tissue culture again 2/14; substantial wound area on the  right posterior ankle, medial ankle. We have been using silver alginate The patient was seen by vein and vascular she had both venous reflux studies and arterial studies. 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 She had no evidence of superficial venous thrombosis. She did have superficial vein reflux at the saphenofemoral junction and the greater saphenous vein. On the left no evidence of a DVT no evidence of superficial venous throat thrombosis she did have deep vein reflux in the common femoral vein and superficial vein reflux in the greater saphenous vein but these were not felt to be amenable to ablation. In terms of arterial studies she had triphasic and wife biphasic wave waveforms bilaterally not felt to have a significant arterial issue. I do not get the feeling that they felt that any part of her nonhealing wounds were related to either arterial or venous issues. They did note that she had venous reflux at the right at the [facility] and [facility]. And also on the left there were reflux in the deep system at the common femoral vein and greater saphenous vein in the proximal thigh. Nothing amenable to ablation. 2/20; she is making some decent progress on the right where there is nice skin between the 2 open areas on the right ankle. The surfaces here do not look viable yet there is some surrounding epithelialization. She still has a small area on the left medial ankle area. Hyper-granulated Jody's away always 2/28 patient has an appointment with plastic surgery on 3/8. We will see her back on 3/9. She may have to call us to get the area redressed. We've been using Santyl under silver alginate. We made a nice improvement on the left medial ankle. The  larger wounds on the right also looks somewhat better in terms of epithelialization although I think they could benefit from an aggressive debridement if plastic surgery would be  willing to do that. Perhaps placement of Integra or a cell 07/26/2021: She saw Dr. Claudia Desanctis yesterday. He raised the question as to whether or not this might be pyoderma and wanted to wait until that question was answered by dermatology before proceeding with any sort of operative debridement. We have continue to use Santyl under silver alginate with Kerlix and Coban wraps. Overall, her wounds appear to be continuing to contract and epithelialize, with some granulation tissue present. There continues to be some slough on all wound surfaces. 08/09/2021: She has not been able to get an appointment with dermatology because apparently the offices in [place] do not accept Medicaid. She is looking into whether or not she can be seen at the main [facility] dermatology clinic. This is necessary because plastic surgery is concerned that her wounds might represent pyoderma and they did not want to do any procedure until that was clarified. We have been using Santyl under silver alginate with Kerlix and Coban wraps. Today, there was a greater amount of drainage on her dressings with a slight green discoloration and significant odor. Despite this, her wounds continue to contract and epithelialize. There is pale granulation tissue present and actually, on the left medial ankle, the granulation tissue is a bit hypertrophic. 08/16/2021: Last week, I took a culture and this grew back rare methicillin-resistant Staph aureus and rare corynebacterium. The MRSA was sensitive to gentamicin which we began applying topically on an empiric basis. This week, her wounds are a bit smaller and the drainage and odor are less. Her primary care provider is working on assisting the patient with a dermatology evaluation. She has been in silver alginate over the gentamicin that was started last week along with Kerlix and Coban wraps. 08/23/2021: Because she has Medicaid, we have been unable to get her into see any  dermatologist in the [place] to rule out pyoderma gangrenosum, which was a requirement from plastic surgery prior to any sort of debridement and grafting. Despite this, however, all of her wounds continue to get smaller. The wound on her left medial ankle is nearly closed. There is no odor from the wounds, although she still accumulates a modest amount of drainage on her dressings. 08/30/2021: The lateral right ankle wound and the medial left ankle wound are a bit smaller today. The medial right ankle wound is about the same size. They are less tender. We have still been unable to get her into dermatology. 09/06/2021: All of the wounds are about the same size today. She continues to endorse minimal pain. I communicated with Dr. Claudia Desanctis in plastic surgery regarding our issues getting a dermatology appointment; he was out of town but indicated that he would look into perhaps performing the biopsy in his office and will have his office contact her. 09/14/2021: The patient has an appointment in dermatology, but it is not until October. Her wounds are roughly the same; she continues to have very thick purulent-looking drainage on her dressings. 09/20/2021: The left medial wound is nearly closed and just has a bit of accumulated eschar on the surface. The right medial and lateral ankle wounds are perhaps a little bit smaller. They continue to have a very pale surface with accumulation of thin slough. 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 I  did not think [employer] was indicated based on this. She is getting topical mupirocin with Prisma silver collagen. 10/04/2021: The patient was not seen in clinic last week due to childcare coverage issues. In the interim, the left medial leg wound has closed. The right sided leg wounds are smaller. There is more granulation tissue coming through, particularly on the lateral wound. The surface remains somewhat gritty. We have been applying topical  mupirocin and Prisma silver collagen. 10/11/2021: The left medial leg wound remains closed. She does complain of some anesthetic sensation to the area. Both of the right-sided leg wounds are smaller but still have accumulated slough. 10/18/2021: Both right-sided leg wounds are minimally smaller this week. She still continues to accumulate slough and has thick drainage on her dressings. 10/23/2021: Both wounds continue to contract. There is still slough buildup. She has been approved for a keratin-based skin substitute trial product but it will not be available until next week. 10/30/2021: The wounds are about the same to perhaps slightly smaller. There is still continued slough buildup. Unfortunately, the rep for the keratin based product did not show up today and did not answer his phone when called. Patient History Information obtained from Patient. Family History Diabetes - Mother, Lung Disease - Mother, No family history of Cancer, Heart Disease, Hereditary Spherocytosis, Hypertension, Kidney Disease, Seizures, Stroke, Thyroid Problems, Tuberculosis. Social History Never smoker, Marital Status - Married, Alcohol Use - Never, Drug Use - No History, Caffeine Use - Daily. Medical History Eyes Denies history of Cataracts, Glaucoma, Optic Neuritis Ear/Nose/Mouth/Throat Denies history of Chronic sinus problems/congestion, Middle ear problems Hematologic/Lymphatic Patient has history of Anemia, [name] Cell Disease Denies history of Hemophilia, Human Immunodeficiency Virus, Lymphedema Respiratory Denies history of Aspiration, Asthma, Chronic Obstructive Pulmonary Disease (COPD), Pneumothorax, Sleep Apnea, Tuberculosis Cardiovascular Denies history of Angina, Arrhythmia, Congestive Heart Failure, Coronary Artery Disease, Deep Vein Thrombosis, Hypertension, Hypotension, Myocardial Infarction, Peripheral Arterial Disease, Peripheral Venous Disease, Phlebitis, Vasculitis Gastrointestinal Denies  history of Cirrhosis , Colitis, Crohnoos, Hepatitis A, Hepatitis B, Hepatitis C Endocrine Denies history of Type I Diabetes, Type II Diabetes Genitourinary Denies history of End Stage Renal Disease Immunological Denies history of Lupus Erythematosus, Raynaudoos, Scleroderma Integumentary (Skin) Denies history of History of Burn Musculoskeletal Denies history of Gout, Rheumatoid Arthritis, Osteoarthritis, Osteomyelitis Neurologic Patient has history of Neuropathy - right foot intermittant Denies history of Dementia, Quadriplegia, Paraplegia, Seizure Disorder Oncologic Denies history of Received Chemotherapy, Received Radiation Psychiatric Denies history of Anorexia/bulimia, Confinement Anxiety Hospitalization/Surgery History - c section x2. - left breast lumpectomy. - iandD right ankle with theraskin. Medical A Surgical History Notes nd Constitutional Symptoms (General Health) H/O miscarriage Cardiovascular bradycardia Gastrointestinal cholilithiasis Objective Constitutional No acute distress.. Vitals Time Taken: 10:46 AM, Height: 67 in, Temperature: 98.4 F, Pulse: 87 bpm, Respiratory Rate: 18 breaths/min, Blood Pressure: 121/67 mmHg. Respiratory Normal work of breathing on room air.. General Notes: 10/30/2021: The wounds are roughly the same size this week with the same amount of slough accumulation as usual. The surface is the same. Integumentary (Hair, Skin) Wound #17 status is Open. Original cause of wound was Gradually Appeared. The date acquired was: 10/05/2012. The wound has been in treatment 28 weeks. The wound is located on the Right,Lateral Lower Leg. The wound measures 7cm length x 5cm width x 0.1cm depth; 27.489cm^2 area and 2.749cm^3 volume. There is Fat Layer (Subcutaneous Tissue) exposed. There is no tunneling or undermining noted. There is a medium amount of purulent drainage noted. The wound margin is flat and intact.  There is small (1-33%) pink, pale, hyper -  granulation within the wound bed. There is a large (67-100%) amount of necrotic tissue within the wound bed including Adherent Slough. Wound #21 status is Open. Original cause of wound was Gradually Appeared. The date acquired was: 06/26/2021. The wound has been in treatment 18 weeks. The wound is located on the Right,Medial Ankle. The wound measures 5.4cm length x 3cm width x 0.1cm depth; 12.723cm^2 area and 1.272cm^3 volume. There is Fat Layer (Subcutaneous Tissue) exposed. There is no tunneling or undermining noted. There is a medium amount of purulent drainage noted. The wound margin is distinct with the outline attached to the wound base. There is small (1-33%) pink granulation within the wound bed. There is a large (67-100%) amount of necrotic tissue within the wound bed including Adherent Slough. 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 There was a Excisional Skin/Subcutaneous Tissue Debridement with a total area of 35 sq cm performed by [name] Maudlin, MD. With the following instrument(s): Curette to remove Non-Viable tissue/material. Material removed includes Subcutaneous Tissue and Slough and after achieving pain control using Other (Benzocaine 20%). No specimens were taken. A time out was conducted at 11:03, prior to the start of the procedure. A Minimum amount of bleeding was controlled with Pressure. The procedure was tolerated well with a pain level of 0 throughout and a pain level of 0 following the procedure. Post Debridement Measurements: 7cm length x 5cm width x 0.1cm depth; 2.749cm^3 volume. Character of Wound/Ulcer Post Debridement is improved. Post procedure Diagnosis Wound #17: Same as Pre-Procedure Wound #21 Pre-procedure diagnosis of  Wound #21 is a [name] Cell Lesion located on the Right,Medial Ankle .Severity of Tissue Pre Debridement is: Fat layer exposed. There was a Excisional Skin/Subcutaneous Tissue Debridement with a total area of 16.2 sq cm performed by [name] Maudlin, MD. With the following instrument(s): Curette to remove Non-Viable tissue/material. Material removed includes Subcutaneous Tissue and Slough and after achieving pain control using Other (Benzocaine 20%). No specimens were taken. A time out was conducted at 11:03, prior to the start of the procedure. A Minimum amount of bleeding was controlled with Pressure. The procedure was tolerated well with a pain level of 0 throughout and a pain level of 0 following the procedure. Post Debridement Measurements: 5.4cm length x 3cm width x 0.1cm depth; 1.272cm^3 volume. Character of Wound/Ulcer Post Debridement is improved. Severity of Tissue Post Debridement is: Fat layer exposed. Post procedure Diagnosis Wound #21: Same as Pre-Procedure Plan Follow-up Appointments: Return Appointment in 1 week. - Dr. Celine Ahr - Room 3 Bathing/ Shower/ Hygiene: May shower with protection but do not get wound dressing(s) wet. - Can get cast protector bags at [facility] or [facility] Edema Control - Lymphedema / SCD / Other: Elevate legs to the level of the heart or above for 30 minutes daily and/or when sitting, a frequency of: - throughout the day Avoid standing for long periods of time. Exercise regularly Additional Orders / Instructions: Follow Nutritious Diet WOUND #17: - Lower Leg Wound Laterality: Right, Lateral Cleanser: Soap and Water 1 x Per Week/30 Days Discharge Instructions: May shower and wash wound with dial antibacterial soap and water prior to dressing change. Cleanser: Wound Cleanser 1 x Per Week/30 Days Discharge Instructions: Cleanse the wound with wound cleanser prior to  applying a clean dressing using gauze sponges, not tissue or cotton balls. Peri-Wound Care:  Triamcinolone 15 (g) 1 x Per Week/30 Days Discharge Instructions: Use triamcinolone 15 (g) as directed Peri-Wound Care: Sween Lotion (Moisturizing lotion) 1 x Per Week/30 Days Discharge Instructions: Apply moisturizing lotion as directed Topical: Mupirocin Ointment 1 x Per Week/30 Days Discharge Instructions: Apply Mupirocin (Bactroban) as instructed Prim Dressing: Promogran Prisma Matrix, 4.34 (sq in) (silver collagen) 1 x Per Week/30 Days ary Discharge Instructions: Moisten collagen with saline or hydrogel Secondary Dressing: ABD Pad, 5x9 1 x Per Week/30 Days Discharge Instructions: Apply over primary dressing as directed. Secondary Dressing: Zetuvit Plus 4x8 in 1 x Per Week/30 Days Discharge Instructions: Apply over primary dressing as directed. Com pression Wrap: Kerlix Roll 4.5x3.1 (in/yd) 1 x Per Week/30 Days Discharge Instructions: Apply Kerlix and Coban compression as directed. Com pression Wrap: Coban Self-Adherent Wrap 4x5 (in/yd) 1 x Per Week/30 Days Discharge Instructions: Apply over Kerlix as directed. WOUND #21: - Ankle Wound Laterality: Right, Medial Cleanser: Soap and Water 1 x Per Week/30 Days Discharge Instructions: May shower and wash wound with dial antibacterial soap and water prior to dressing change. Cleanser: Wound Cleanser 1 x Per Week/30 Days Discharge Instructions: Cleanse the wound with wound cleanser prior to applying a clean dressing using gauze sponges, not tissue or cotton balls. Peri-Wound Care: Triamcinolone 15 (g) 1 x Per Week/30 Days Discharge Instructions: Use triamcinolone 15 (g) as directed Peri-Wound Care: Sween Lotion (Moisturizing lotion) 1 x Per Week/30 Days Discharge Instructions: Apply moisturizing lotion as directed Topical: Mupirocin Ointment 1 x Per Week/30 Days Discharge Instructions: Apply Mupirocin (Bactroban) as instructed Prim Dressing: Promogran Prisma Matrix, 4.34 (sq in) (silver collagen) 1 x Per Week/30 Days ary Discharge  Instructions: Moisten collagen with saline or hydrogel Secondary Dressing: ABD Pad, 5x9 1 x Per Week/30 Days Discharge Instructions: Apply over primary dressing as directed. Secondary Dressing: Zetuvit Plus 4x8 in 1 x Per Week/30 Days Discharge Instructions: Apply over primary dressing as directed. Com pression Wrap: Kerlix Roll 4.5x3.1 (in/yd) 1 x Per Week/30 Days Discharge Instructions: Apply Kerlix and Coban compression as directed. Com pression Wrap: Coban Self-Adherent Wrap 4x5 (in/yd) 1 x Per Week/30 Days Discharge Instructions: Apply over Kerlix as directed. 10/30/2021: The wounds are roughly the same size this week with the same amount of slough accumulation as usual. The surface is the same. I used a curette to debride slough and subcu tissue from the wounds. We will continue our current regimen of mupirocin and Prisma silver collagen. We will try to contact the rep for the trial skin substitute and hopefully be able to apply that next week. I will see her then. Electronic Signature(s) Signed: 10/30/2021 11:16:35 AM By: [name] Maudlin MD FACS Entered By: [name] on 10/30/2021 11:16:35 -------------------------------------------------------------------------------- HxROS Details Patient Name: Date of Service: [name] [name], [name] 10/30/2021 10:15 A M Medical Record Number: [id number] Patient Account Number: [id number] Date of Birth/Sex: Treating RN: February 06, 1971 (51 y.o. [name] Primary Care Provider: Cammie [name] Other Clinician: Referring Provider: Treating Provider/Extender: [name] Weeks in Treatment: 28 Information Obtained From Patient Constitutional Symptoms (General Health) Medical History: Past Medical History Notes: H/O miscarriage Eyes Medical History: Negative for: Cataracts; Glaucoma; Optic Neuritis Ear/Nose/Mouth/Throat Medical History: Negative for: Chronic sinus problems/congestion; Middle ear  problems Hematologic/Lymphatic Medical History: Positive for: Anemia; [name] Cell Disease Negative for: Hemophilia; Human Immunodeficiency Virus; Lymphedema Respiratory Medical History: Negative for: Aspiration; Asthma; Chronic Obstructive Pulmonary Disease (COPD); Pneumothorax; Sleep Apnea;  Tuberculosis Cardiovascular Medical History: Negative for: Angina; Arrhythmia; Congestive Heart Failure; Coronary Artery Disease; Deep Vein Thrombosis; Hypertension; Hypotension; Myocardial Infarction; Peripheral Arterial Disease; Peripheral Venous Disease; Phlebitis; Vasculitis Past Medical History Notes: bradycardia Gastrointestinal Medical History: Negative for: Cirrhosis ; Colitis; Crohns; Hepatitis A; Hepatitis B; Hepatitis C Past Medical History Notes: cholilithiasis Endocrine Medical History: Negative for: Type I Diabetes; Type II Diabetes Genitourinary Medical History: Negative for: End Stage Renal Disease Immunological Medical History: Negative for: Lupus Erythematosus; Raynauds; Scleroderma Integumentary (Skin) Medical History: Negative for: History of Burn Musculoskeletal Medical History: Negative for: Gout; Rheumatoid Arthritis; Osteoarthritis; Osteomyelitis Neurologic Medical History: Positive for: Neuropathy - right foot intermittant Negative for: Dementia; Quadriplegia; Paraplegia; Seizure Disorder Oncologic Medical History: Negative for: Received Chemotherapy; Received Radiation Psychiatric Medical History: Negative for: Anorexia/bulimia; Confinement Anxiety Immunizations Pneumococcal Vaccine: Received Pneumococcal Vaccination: No Implantable Devices None Hospitalization / Surgery History Type of Hospitalization/Surgery c section x2 left breast lumpectomy iandD right ankle with theraskin Family and Social History Cancer: No; Diabetes: Yes - Mother; Heart Disease: No; Hereditary Spherocytosis: No; Hypertension: No; Kidney Disease: No; Lung Disease: Yes -  Mother; Seizures: No; Stroke: No; Thyroid Problems: No; Tuberculosis: No; Never smoker; Marital Status - Married; Alcohol Use: Never; Drug Use: No History; Caffeine Use: Daily; Financial Concerns: No; Food, Clothing or Shelter Needs: No; Support System Lacking: No; Transportation Concerns: No Electronic Signature(s) Signed: 10/30/2021 12:13:44 PM By: [name] Maudlin MD FACS Signed: 10/30/2021 5:37:39 PM By: [name] Catholic RN Entered By: [name] on 10/30/2021 11:10:19 -------------------------------------------------------------------------------- SuperBill Details Patient Name: Date of Service: [name] [name], [name] 10/30/2021 Medical Record Number: [id number] Patient Account Number: [id number] Date of Birth/Sex: Treating RN: 01-20-1971 (51 y.o. 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 Facility Procedures CPT4 Code: [id number] Description: 57017 - DEB SUBQ TISSUE 20 SQ CM/< ICD-10 Diagnosis Description L97.818 Non-pressure chronic ulcer of other part of right lower leg with other specified Modifier: severity Quantity: 1 CPT4 Code: [id number] Description: 92330 - DEB SUBQ TISS EA ADDL 20CM ICD-10 Diagnosis Description L97.818 Non-pressure chronic ulcer of other part of right lower leg with other specified Modifier: severity Quantity: 2 Physician Procedures : CPT4 Code Description Modifier [id number] 99213 - WC PHYS LEVEL 3 - EST PT 25 ICD-10 Diagnosis Description L97.818 Non-pressure chronic ulcer of other part of right lower leg with other specified severity D57.1 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

## 2021-10-31 NOTE — Progress Notes (Signed)
HONESTEE, REVARD ([id number]) Visit Report for 10/30/2021 Arrival Information Details Patient Name: Date of Service: [name] [name] 10/30/2021 10:15 A M Medical Record Number: [id number] Patient Account Number: [id number] Date of Birth/Sex: Treating RN: [birth year] (51 y.o. [name] Primary Care Tonantzin Mimnaugh: [name] Other Clinician: Referring Quentez Lober: Treating Deondra Labrador/Extender: Elyse Jarvis Weeks in Treatment: 28 Visit Information History Since Last Visit Added or deleted any medications: No Patient Arrived: Walker Any new allergies or adverse reactions: No Arrival Time: 10:46 Had a fall or experienced change in No Accompanied By: alone activities of daily living that may affect Transfer Assistance: None risk of falls: Patient Identification Verified: Yes Signs or symptoms of abuse/neglect since last visito No Secondary Verification Process Completed: Yes Hospitalized since last visit: No Patient Requires Transmission-Based Precautions: No Implantable device outside of the clinic excluding No Patient Has Alerts: No cellular tissue based products placed in the center since last visit: Has Dressing in Place as Prescribed: Yes Has Compression in Place as Prescribed: Yes Pain Present Now: No Electronic Signature(s) Signed: 10/31/2021 6:00:02 PM By: Levan Hurst RN, BSN Entered By: Levan Hurst on 10/30/2021 10:47:00 -------------------------------------------------------------------------------- Encounter Discharge Information Details Patient Name: Date of Service: [name], [name] 10/30/2021 10:15 A M Medical Record Number: [id number] Patient Account Number: [id number] Date of Birth/Sex: Treating RN: 04/08/1971 (51 y.o. [name] Primary Care [place] Jakubiak: [name] Other Clinician: Referring Darnita Woodrum: Treating Haislee Corso/Extender: Elyse Jarvis Weeks in Treatment: 28 Encounter Discharge Information Items  Post Procedure Vitals Discharge Condition: Stable Temperature (F): 98.4 Ambulatory Status: Walker Pulse (bpm): 87 Discharge Destination: Home Respiratory Rate (breaths/min): 18 Transportation: Other Blood Pressure (mmHg): 121/67 Accompanied By: self Schedule Follow-up Appointment: Yes Clinical Summary of Care: Patient Declined Electronic Signature(s) Signed: 10/30/2021 5:37:39 PM By: Dellie Catholic RN Entered By: Dellie Catholic on 10/30/2021 11:43:12 -------------------------------------------------------------------------------- Lower Extremity Assessment Details Patient Name: Date of Service: [name] [name] 10/30/2021 10:15 A M Medical Record Number: [id number] Patient Account Number: [id number] Date of Birth/Sex: Treating RN: [birth year] (51 y.o. [name] Primary Care Tavarius Grewe: [name] Other Clinician: Referring Rateel Beldin: Treating Alister Staver/Extender: Elyse Jarvis Weeks in Treatment: 28 Edema Assessment Assessed: [Left: No] [Right: No] Edema: [Left: Ye] [Right: s] Calf Left: Right: Point of Measurement: 33 cm From Medial Instep 31.8 cm Ankle Left: Right: Point of Measurement: 10 cm From Medial Instep 19.5 cm Vascular Assessment Pulses: Dorsalis Pedis Palpable: [Right:Yes] Electronic Signature(s) Signed: 10/31/2021 6:00:02 PM By: Levan Hurst RN, BSN Entered By: Levan Hurst on 10/30/2021 10:49:47 -------------------------------------------------------------------------------- Multi Wound Chart Details Patient Name: Date of Service: [name], [name] 10/30/2021 10:15 A M Medical Record Number: [id number] Patient Account Number: [id number] Date of Birth/Sex: Treating RN: 1970/07/11 (51 y.o. [name] Primary Care Emmaly Leech: [name] Other Clinician: Referring Hannia Matchett: Treating Marlin Jarrard/Extender: Elyse Jarvis Weeks in Treatment: 28 Vital Signs Height(in): 67 Pulse(bpm):  1 Weight(lbs): Blood Pressure(mmHg): 121/67 Body Mass Index(BMI): Temperature(F): 98.4 Respiratory Rate(breaths/min): 18 Photos: [N/A:N/A] Right, Lateral Lower Leg Right, Medial Ankle N/A Wound Location: Gradually Appeared Gradually Appeared N/A Wounding Event: Sickle Cell Lesion Sickle Cell Lesion N/A Primary Etiology: N/A Venous Leg Ulcer N/A Secondary Etiology: Anemia, Sickle Cell Disease, Anemia, Sickle Cell Disease, N/A Comorbid History: Neuropathy Neuropathy 10/05/2012 06/26/2021 N/A Date Acquired: 49 18 N/A Weeks of Treatment: Open Open N/A Wound Status: No No N/A Wound Recurrence: Yes No N/A Clustered Wound: 7x5x0.1 5.4x3x0.1 N/A Measurements L x W x D (cm) 27.489 12.723  N/A A (cm) : rea 2.749 1.272 N/A Volume (cm) : 85.40% 56.10% N/A % Reduction in A rea: 85.40% 56.10% N/A % Reduction in Volume: Full Thickness Without Exposed Full Thickness Without Exposed N/A Classification: Support Structures Support Structures Medium Medium N/A Exudate A mount: Purulent Purulent N/A Exudate Type: yellow, [name], green yellow, [name], green N/A Exudate Color: Flat and Intact Distinct, outline attached N/A Wound Margin: Small (1-33%) Small (1-33%) N/A Granulation A mount: Pink, Pale, Hyper-granulation Pink N/A Granulation Quality: Large (67-100%) Large (67-100%) N/A Necrotic A mount: Fat Layer (Subcutaneous Tissue): Yes Fat Layer (Subcutaneous Tissue): Yes N/A Exposed Structures: Fascia: No Fascia: No Tendon: No Tendon: No Muscle: No Muscle: No Joint: No Joint: No Bone: No Bone: No Small (1-33%) Small (1-33%) N/A Epithelialization: Debridement - Excisional Debridement - Excisional N/A Debridement: Pre-procedure Verification/Time Out 11:03 11:03 N/A Taken: Other Other N/A Pain Control: Subcutaneous, Slough Subcutaneous, Slough N/A Tissue Debrided: Skin/Subcutaneous Tissue Skin/Subcutaneous Tissue N/A Level: 35 16.2 N/A Debridement A (sq  cm): rea Curette Curette N/A Instrument: Minimum Minimum N/A Bleeding: Pressure Pressure N/A Hemostasis A chieved: 0 0 N/A Procedural Pain: 0 0 N/A Post Procedural Pain: Procedure was tolerated well Procedure was tolerated well N/A Debridement Treatment Response: 7x5x0.1 5.4x3x0.1 N/A Post Debridement Measurements L x W x D (cm) 2.749 1.272 N/A Post Debridement Volume: (cm) Debridement Debridement N/A Procedures Performed: Treatment Notes Electronic Signature(s) Signed: 10/30/2021 11:08:40 AM By: Fredirick Maudlin MD FACS Signed: 10/30/2021 5:37:39 PM By: Dellie Catholic RN Entered By: Fredirick Maudlin on 10/30/2021 11:08:40 -------------------------------------------------------------------------------- Multi-Disciplinary Care Plan Details Patient Name: Date of Service: [name], [name] 10/30/2021 10:15 A M Medical Record Number: [id number] Patient Account Number: [id number] Date of Birth/Sex: Treating RN: 11/26/1970 (51 y.o. [name] Primary Care Cayton Cuevas: [name] Other Clinician: Referring Deronda Christian: Treating Chandlar Staebell/Extender: Elyse Jarvis Weeks in Treatment: 28 Multidisciplinary Care Plan reviewed with physician Active Inactive Venous Leg Ulcer Nursing Diagnoses: Actual venous Insuffiency (use after diagnosis is confirmed) Knowledge deficit related to disease process and management Goals: Patient will maintain optimal edema control Date Initiated: 06/26/2021 Target Resolution Date: 11/16/2021 Goal Status: Active Interventions: Assess peripheral edema status every visit. Compression as ordered Treatment Activities: Therapeutic compression applied : 06/26/2021 Notes: Wound/Skin Impairment Nursing Diagnoses: Impaired tissue integrity Goals: Patient/caregiver will verbalize understanding of skin care regimen Date Initiated: 04/17/2021 Target Resolution Date: 11/16/2021 Goal Status: Active Ulcer/skin breakdown will have a  volume reduction of 30% by week 4 Date Initiated: 04/17/2021 Date Inactivated: 05/29/2021 Target Resolution Date: 05/15/2021 Goal Status: Met Ulcer/skin breakdown will have a volume reduction of 50% by week 8 Date Initiated: 05/29/2021 Date Inactivated: 06/26/2021 Target Resolution Date: 06/26/2021 Goal Status: Unmet Unmet Reason: venous reflux Interventions: Assess patient/caregiver ability to obtain necessary supplies Assess patient/caregiver ability to perform ulcer/skin care regimen upon admission and as needed Assess ulceration(s) every visit Provide education on ulcer and skin care Treatment Activities: Topical wound management initiated : 04/17/2021 Notes: 06/08/21: Left leg wounds greater than 30% volume reduction, right leg acute infection. Electronic Signature(s) Signed: 10/31/2021 6:00:02 PM By: Levan Hurst RN, BSN Entered By: Levan Hurst on 10/30/2021 10:56:46 -------------------------------------------------------------------------------- Pain Assessment Details Patient Name: Date of Service: [name], [name] 10/30/2021 10:15 A M Medical Record Number: [id number] Patient Account Number: [id number] Date of Birth/Sex: Treating RN: 13-May-1971 (51 y.o. [name] Primary Care Unique Sillas: [name] Other Clinician: Referring Carzell Saldivar: Treating Pinkey Mcjunkin/Extender: Elyse Jarvis Weeks in Treatment: 28 Active Problems Location of Pain Severity and Description  of Pain Patient Has Paino No Site Locations Pain Management and Medication Current Pain Management: Electronic Signature(s) Signed: 10/31/2021 6:00:02 PM By: Levan Hurst RN, BSN Entered By: Levan Hurst on 10/30/2021 10:47:45 -------------------------------------------------------------------------------- Patient/Caregiver Education Details Patient Name: Date of Service: [name], [name] 6/13/2023andnbsp10:15 [place] Number: [id number] Patient Account Number:  [id number] Date of Birth/Gender: Treating RN: 1970/12/31 (51 y.o. [name] Primary Care Physician: [name] Other Clinician: Referring Physician: Treating Physician/Extender: Elyse Jarvis Weeks in Treatment: 28 Education Assessment Education Provided To: Patient Education Topics Provided Wound/Skin Impairment: Methods: Explain/Verbal Responses: State content correctly Electronic Signature(s) Signed: 10/31/2021 6:00:02 PM By: Levan Hurst RN, BSN Entered By: Levan Hurst on 10/30/2021 10:57:13 -------------------------------------------------------------------------------- Wound Assessment Details Patient Name: Date of Service: [name], [name] 10/30/2021 10:15 A M Medical Record Number: [id number] Patient Account Number: [id number] Date of Birth/Sex: Treating RN: [birth year] (51 y.o. [name] Primary Care Shemekia Patane: [name] Other Clinician: Referring Knox Holdman: Treating Viana Sleep/Extender: Elyse Jarvis Weeks in Treatment: 28 Wound Status Wound Number: 17 Primary Etiology: Sickle Cell Lesion Wound Location: Right, Lateral Lower Leg Wound Status: Open Wounding Event: Gradually Appeared Comorbid History: Anemia, Sickle Cell Disease, Neuropathy Date Acquired: 10/05/2012 Weeks Of Treatment: 28 Clustered Wound: Yes Photos Wound Measurements Length: (cm) 7 Width: (cm) 5 Depth: (cm) 0.1 Area: (cm) 27.489 Volume: (cm) 2.749 % Reduction in Area: 85.4% % Reduction in Volume: 85.4% Epithelialization: Small (1-33%) Tunneling: No Undermining: No Wound Description Classification: Full Thickness Without Exposed Support Structures Wound Margin: Flat and Intact Exudate Amount: Medium Exudate Type: Purulent Exudate Color: yellow, [name], green Foul Odor After Cleansing: No Slough/Fibrino Yes Wound Bed Granulation Amount: Small (1-33%) Exposed Structure Granulation Quality: Pink, Pale,  Hyper-granulation Fascia Exposed: No Necrotic Amount: Large (67-100%) Fat Layer (Subcutaneous Tissue) Exposed: Yes Necrotic Quality: Adherent Slough Tendon Exposed: No Muscle Exposed: No Joint Exposed: No Bone Exposed: No Treatment Notes Wound #17 (Lower Leg) Wound Laterality: Right, Lateral Cleanser Soap and Water Discharge Instruction: May shower and wash wound with dial antibacterial soap and water prior to dressing change. Wound Cleanser Discharge Instruction: Cleanse the wound with wound cleanser prior to applying a clean dressing using gauze sponges, not tissue or cotton balls. Peri-Wound Care Triamcinolone 15 (g) Discharge Instruction: Use triamcinolone 15 (g) as directed Sween Lotion (Moisturizing lotion) Discharge Instruction: Apply moisturizing lotion as directed Topical Mupirocin Ointment Discharge Instruction: Apply Mupirocin (Bactroban) as instructed Primary Dressing Promogran Prisma Matrix, 4.34 (sq in) (silver collagen) Discharge Instruction: Moisten collagen with saline or hydrogel Secondary Dressing ABD Pad, 5x9 Discharge Instruction: Apply over primary dressing as directed. Zetuvit Plus 4x8 in Discharge Instruction: Apply over primary dressing as directed. Secured With Compression Wrap Kerlix Roll 4.5x3.1 (in/yd) Discharge Instruction: Apply Kerlix and Coban compression as directed. Coban Self-Adherent Wrap 4x5 (in/yd) Discharge Instruction: Apply over Kerlix as directed. Compression Stockings Add-Ons Electronic Signature(s) Signed: 10/31/2021 6:00:02 PM By: Levan Hurst RN, BSN Entered By: Levan Hurst on 10/30/2021 10:55:52 -------------------------------------------------------------------------------- Wound Assessment Details Patient Name: Date of Service: [name], [name] 10/30/2021 10:15 A M Medical Record Number: [id number] Patient Account Number: [id number] Date of Birth/Sex: Treating RN: 03/21/1971 (51 y.o. [name] Primary  Care Swayze Kozuch: [name] Other Clinician: Referring Jeffifer Rabold: Treating Kionte Baumgardner/Extender: Elyse Jarvis Weeks in Treatment: 28 Wound Status Wound Number: 21 Primary Etiology: Sickle Cell Lesion Wound Location: Right, Medial Ankle Secondary Etiology: Venous Leg Ulcer Wounding Event: Gradually Appeared Wound Status: Open Date Acquired: 06/26/2021 Comorbid  History: Anemia, Sickle Cell Disease, Neuropathy Weeks Of Treatment: 18 Clustered Wound: No Photos Wound Measurements Length: (cm) 5.4 Width: (cm) 3 Depth: (cm) 0.1 Area: (cm) 12.723 Volume: (cm) 1.272 % Reduction in Area: 56.1% % Reduction in Volume: 56.1% Epithelialization: Small (1-33%) Tunneling: No Undermining: No Wound Description Classification: Full Thickness Without Exposed Support Structures Wound Margin: Distinct, outline attached Exudate Amount: Medium Exudate Type: Purulent Exudate Color: yellow, [name], green Wound Bed Granulation Amount: Small (1-33%) Granulation Quality: Pink Necrotic Amount: Large (67-100%) Necrotic Quality: Adherent Slough Foul Odor After Cleansing: No Slough/Fibrino Yes Exposed Structure Fascia Exposed: No Fat Layer (Subcutaneous Tissue) Exposed: Yes Tendon Exposed: No Muscle Exposed: No Joint Exposed: No Bone Exposed: No Treatment Notes Wound #21 (Ankle) Wound Laterality: Right, Medial Cleanser Soap and Water Discharge Instruction: May shower and wash wound with dial antibacterial soap and water prior to dressing change. Wound Cleanser Discharge Instruction: Cleanse the wound with wound cleanser prior to applying a clean dressing using gauze sponges, not tissue or cotton balls. Peri-Wound Care Triamcinolone 15 (g) Discharge Instruction: Use triamcinolone 15 (g) as directed Sween Lotion (Moisturizing lotion) Discharge Instruction: Apply moisturizing lotion as directed Topical Mupirocin Ointment Discharge Instruction: Apply Mupirocin (Bactroban)  as instructed Primary Dressing Promogran Prisma Matrix, 4.34 (sq in) (silver collagen) Discharge Instruction: Moisten collagen with saline or hydrogel Secondary Dressing ABD Pad, 5x9 Discharge Instruction: Apply over primary dressing as directed. Zetuvit Plus 4x8 in Discharge Instruction: Apply over primary dressing as directed. Secured With Compression Wrap Kerlix Roll 4.5x3.1 (in/yd) Discharge Instruction: Apply Kerlix and Coban compression as directed. Coban Self-Adherent Wrap 4x5 (in/yd) Discharge Instruction: Apply over Kerlix as directed. Compression Stockings Add-Ons Electronic Signature(s) Signed: 10/31/2021 6:00:02 PM By: Levan Hurst RN, BSN Entered By: Levan Hurst on 10/30/2021 10:55:31 -------------------------------------------------------------------------------- Chicken Details Patient Name: Date of Service: [name], [name] 10/30/2021 10:15 A M Medical Record Number: [id number] Patient Account Number: [id number] Date of Birth/Sex: Treating RN: 20-Sep-1970 (51 y.o. [name] Primary Care Najwa Spillane: [name] Other Clinician: Referring Evalyse Stroope: Treating Aniyha Tate/Extender: Elyse Jarvis Weeks in Treatment: 28 Vital Signs Time Taken: 10:46 Temperature (F): 98.4 Height (in): 67 Pulse (bpm): 87 Respiratory Rate (breaths/min): 18 Blood Pressure (mmHg): 121/67 Reference Range: 80 - 120 mg / dl Electronic Signature(s) Signed: 10/31/2021 6:00:02 PM By: Levan Hurst RN, BSN Entered By: Levan Hurst on 10/30/2021 10:47:13

## 2021-11-01 ENCOUNTER — Ambulatory Visit (HOSPITAL_BASED_OUTPATIENT_CLINIC_OR_DEPARTMENT_OTHER): Payer: Medicaid Other | Admitting: General Surgery

## 2021-11-08 ENCOUNTER — Encounter (HOSPITAL_BASED_OUTPATIENT_CLINIC_OR_DEPARTMENT_OTHER): Payer: Medicaid Other | Admitting: General Surgery

## 2021-11-08 DIAGNOSIS — L97812 Non-pressure chronic ulcer of other part of right lower leg with fat layer exposed: Secondary | ICD-10-CM | POA: Diagnosis not present

## 2021-11-08 NOTE — Progress Notes (Signed)
TYNESHIA, STIVERS ([id number]) Visit Report for 11/08/2021 Chief Complaint Document Details Patient Name: Date of Service: [name] 11/08/2021 10:15 A M Medical Record Number: [id number] Patient Account Number: [id number] Date of Birth/Sex: Treating RN: Oct 18, 1970 (51 y.o. [name] Primary Care Provider: [name] Other Clinician: Referring Provider: Treating Provider/Extender: Elyse Jarvis Weeks in Treatment: 29 Information Obtained from: Patient Chief Complaint the patient is here for evaluation of her bilateral lower extremity sickle cell ulcers 04/17/2021; patient comes in for substantial wounds on the right and left lower leg Electronic Signature(s) Signed: 11/08/2021 11:36:13 AM By: Fredirick Maudlin MD FACS Entered By: Fredirick Maudlin on 11/08/2021 11:36:12 -------------------------------------------------------------------------------- Cellular or Tissue Based Product Details Patient Name: Date of Service: [name], [name] 11/08/2021 10:15 A M Medical Record Number: [id number] Patient Account Number: [id number] Date of Birth/Sex: Treating RN: [birth year] (51 y.o. [name] Primary Care Provider: [name] Other Clinician: Referring Provider: Treating Provider/Extender: Elyse Jarvis Weeks in Treatment: 29 Cellular or Tissue Based Product Type Wound #21 Right,Medial Ankle Applied to: Performed By: Physician Fredirick Maudlin, MD Cellular or Tissue Based Product Type: Other Level of Consciousness (Pre-procedure): Awake and Alert Pre-procedure Verification/Time Out Yes - 11:15 Taken: Location: trunk / arms / legs Wound Size (sq cm): 16.24 Product Size (sq cm): 16 Waste Size (sq cm): 0 Amount of Product Applied (sq cm): 16 Instrument Used: Curette Lot #: [id number] Order #: 1 Expiration Date: 06/10/2024 Fenestrated: No Reconstituted: No Secured: Yes Secured With: Staples Dressing  Applied: Yes Primary Dressing: Adaptic;Gauze Procedural Pain: 0 Post Procedural Pain: 0 Response to Treatment: Procedure was tolerated well Level of Consciousness (Post- Awake and Alert procedure): Post Procedure Diagnosis Same as Pre-procedure Notes TRIAL Progena -Do not charge Electronic Signature(s) Signed: 11/08/2021 12:40:23 PM By: Fredirick Maudlin MD FACS Signed: 11/08/2021 5:29:53 PM By: Dellie Catholic RN Entered By: Dellie Catholic on 11/08/2021 11:28:40 -------------------------------------------------------------------------------- Cellular or Tissue Based Product Details Patient Name: Date of Service: [name], [name] 11/08/2021 10:15 A M Medical Record Number: [id number] Patient Account Number: [id number] Date of Birth/Sex: Treating RN: [birth year] (51 y.o. [name] Primary Care Provider: [name] Other Clinician: Referring Provider: Treating Provider/Extender: Elyse Jarvis Weeks in Treatment: 29 Cellular or Tissue Based Product Type Wound #17 Right,Lateral Lower Leg Applied to: Performed By: Physician Fredirick Maudlin, MD Cellular or Tissue Based Product Type: Other Level of Consciousness (Pre-procedure): Awake and Alert Pre-procedure Verification/Time Out Yes - 11:15 Taken: Location: trunk / arms / legs Wound Size (sq cm): 29.25 Product Size (sq cm): 32 Waste Size (sq cm): 0 Amount of Product Applied (sq cm): 32 Instrument Used: Curette Lot #: [id number] Order #: 1 Expiration Date: 06/10/2024 Fenestrated: No Reconstituted: No Secured: Yes Secured With: Staples Dressing Applied: Yes Primary Dressing: Adaptic;Gauze Procedural Pain: 0 Post Procedural Pain: 0 Response to Treatment: Procedure was tolerated well Level of Consciousness (Post- Awake and Alert procedure): Post Procedure Diagnosis Same as Pre-procedure Notes **** TRIAL Progena matrix**** do Not charge Electronic Signature(s) Signed: 11/08/2021  12:40:23 PM By: Fredirick Maudlin MD FACS Signed: 11/08/2021 5:29:53 PM By: Dellie Catholic RN Entered By: Dellie Catholic on 11/08/2021 11:28:56 -------------------------------------------------------------------------------- HPI Details Patient Name: Date of Service: [name], [name] 11/08/2021 10:15 A M Medical Record Number: [id number] Patient Account Number: [id number] Date of Birth/Sex: Treating RN: 1970-06-11 (51 y.o. [name] Primary Care Provider: [name] Other Clinician: Referring Provider: Treating Provider/Extender: Elyse Jarvis Weeks in Treatment:  29 History of Present Illness Location: medial and lateral ankle region on the right and left medial malleolus Quality: Patient reports experiencing a shooting pain to affected area(s). Severity: Patient states wound(s) are getting worse. Duration: right lower extremity bimalleolar ulcers have been present for approximately 2 years; the rright meedial malleolus ulcer has been there proximally 6 months Timing: Pain in wound is constant (hurts all the time) Context: The wound would happen gradually ssociated Signs and Symptoms: Patient reports having increase discharge. A HPI Description: 51 year old patient with a history of sickle cell anemia who was last seen by me with ulceration of the right lower extremity above the ankle and was referred to Dr. Leland Johns for a surgical debridement as I was unable to do anything in the office due to excruciating pain. At that stage she was referred from the plastic surgery service to dermatology who treated her for a skin infection with doxycycline and then Levaquin and a local antibiotic ointment. I understand the patient has since developed ulceration on the left ankle both medial and lateral and was now referred back to the wound center as dermatology has finished the management. I do not have any notes from the dermatology department Old  notes: 51 year old patient with a history of sickle cell anemia, pain bilateral lower extremities, right lower extremity ulcer and has a history of receiving a skin graft( Theraskin) several months ago. She has been visiting the wound center [facility] and was seen by Dr. Dellia Nims and Dr. Leland Johns. after prolonged conservator therapy between July 2016 and January 2017. She had been seen by the plastic surgeon and taken to the OR for debridement and application of Theraskin. She had 3 applications of Theraskin and was then treated with collagen. Prior to that she had a history of similar problems in 2014 and was treated conservatively. Had a reflux study done for the right lower extremity in August 2016 without reflux or DVT . Past medical history significant for sickle cell disease, anemia, leg ulcers, cholelithiasis,and has never been a smoker. Once the patient was discharged on the wound center she says within 2 or 3 weeks the problems recurred and she has been treating it conservatively. since I saw her 3 weeks ago at [facility] she has been unable to get her dressing material but has completed a course of doxycycline. 6/7/ 2017 -- lower extremity venous duplex reflux evaluation was done No evidence of SVT or DVT in the RLL. No venous incompetence in the RLL. No further vascular workup is indicated at this time. She was seen by Dr. [name], on 10/04/2015. She agreed with the plan of taking her to the OR for debridement and application of theraskin and would also take biopsies to rule out pyoderma gangrenosum. Follow-up note dated May 31 received and she was status post application of Theraskin to multiple ulcers around the right ankle. Pathology did not show evidence of malignancy or pyoderma gangrenosum. She would continue to see as in the wound clinic for further care and see Dr. Leland Johns as needed. The patient brought the biopsy report and it was consistent with stasis ulcer no evidence  of malignancy and the comment was that there was some adjacent neovascularization, fibrosis and patchy perivascular chronic inflammation. 11/15/2015 -- today we applied her first application of Theraskin 11/30/15; TheraSkin #2 12/13/2015 -- she is having a lot of pain locally and is here for possible application of a theraskin today. 01/16/2016 -- the patient has significant pain and has noticed despite  in spite of all local care and oral pain medication. It is impossible to debride her in the office. 02/06/2016 -- I do not see any notes from Dr. Iran Planas( the patient has not made a call to the office know as she heard from them) and the only visit to recently was with her PCP Dr. Danella Penton -- I saw her on 01/16/2016 and prescribed 90 tablets of oxycodone 10 mg and did lab work and screening for HIV. the HIV was negative and hemoglobin was 6.3 with a WBC count of 14.9 and hematocrit of 17.8 with platelets of 561. 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 The bimalleolar ulcers to the right lower extremity have been present for approximately 2 years, the left medial malleolus ulcer has been present approximately 6 months. She has followed with Dr.Thimmappa in the past and has had a total of 3 applications of Theraskin (01/2015, 09/2015, 06/17/16). She has also followed with Dr. Con Memos here in the clinic and has received 2 applications of TheraSkin (11/10/15, 11/30/15). The patient does experience chronic, and is not amenable to debridement. 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 She is not currently on any antibiotic therapy and has not been treated with any recently. 07/17/2016 -- was seen by Dr. Iran Planas of plastic surgery who saw her 2 weeks postop application of Theraskin #3. She had removed her dressing and asked her to apply silver  alginate on alternate days and follow-up back with the wound center. Future debridements and application of skin substitute would have to be done in the hospital due to her high risk for anesthesia. READMISSION 04/17/2021 Patient is now a 51 year old woman that we have had in this clinic for a prolonged period of time and 2016-2017 and then again for 2 visits in February 2018. At that point she had wounds on the right lower leg predominantly medial. She had also been seen by plastic surgery Dr. Leland Johns who I believe took her to the OR for operative debridement and application of TheraSkin in 2017. After she left our clinic she was followed for a very prolonged period of time in the wound care center in [facility] who then referred her ultimately to [facility] where she was seen by Dr. Vernona Rieger. Again taken her to the OR for skin grafting which apparently did not take. She had multiple other attempts at dressings although I have not really looked over all of these notes in great detail. She has not been seen in a wound care center in about a year. She states over the last year in addition to her right lower leg she has developed wounds on the left lower leg quite extensive. She is using Xeroform to all of these wounds without really any improvement. She also has Medicaid which does not cover wound products. The patient has had vascular work-ups in the past including most recently on 03/28/2021 showing biphasic waveforms on the right triphasic at the PTA and biphasic at the dorsalis pedis on the left. She was unable to tolerate any degree of compression to do ABIs. Unfortunately TBI's were also not done. She had venous reflux studies done in 2017. This did not show any evidence of a DVT or SVT and no venous incompetence was noted in the right leg at the time this was the only side with the wound As noted I  did not look all over her old records. She apparently had a course of HBO and [facility] although I am not  sure what the indication would have been. In any case she developed seizures and terminated treatment earlier. She is generally much more disabled than when we last saw her in clinic. She can no longer walk pretty much wheelchair-bound because predominantly of pain in the left hip. 04/24/2021; the patient tolerated the wraps we put on. We used Santyl and Hydrofera Blue under compression. I brought her back for a nurse visit for a change in dressing. With Medicaid we will have a hard time getting anything paid for and hence the need for compression. She arrives in clinic with all the wounds looking somewhat better in terms of surface 12/20; circumferential wound on the right from the lateral to the medial. She has open areas on the left medial and left lateral x2 on all of this with the same surface. This does not look completely healthy although she does have some epithelialization. She is not complaining of a lot of pain which is unusual for her sickle ulcers. I have not looked over her extensive records from [facility]. She had recent arterial studies and has a history of venous reflux studies I will need to look these over although I do not believe she has significant arterial disease 2023 05/22/2021; patient's wound areas measure slightly smaller. Still a lot of drainage coming from the right we have been using Hydrofera Blue and Santyl with some improvement in the wound surfaces. She tells me she will be getting transfused later in the week for her underlying sickle cell anemia I have looked over her recent arterial studies which were done in the fall. This was in November and showed biphasic and triphasic waveforms but she could not tolerate ABIs because of pressure and unfortunately TBI's were not done. She has not had recent venous reflux studies that I can see 1/10; not much change about the same surface area. This has a yellowish surface to it very gritty. We have been using Santyl and Hydrofera  Blue for a prolonged period. Culture I did last week showed methicillin sensitive staph aureus "rare". Our intake nurse reports greenish drainage which may be the Hydrofera Blue itself 1/17; wounds are continue to measure smaller although I am not sure about the accuracy here. Especially the areas on the right are covered in what looks to be a nonviable surface although she does have some epithelialization. Similarly she has areas on the left medial and left lateral ankle area which appear to have a better surface and perhaps are slightly smaller. We have been using Santyl and Hydrofera Blue. She cannot tolerate mechanical debridements She went for her reflux studies which showed significant reflux at the greater saphenous vein at the saphenofemoral junction as well as the greater saphenous vein in the proximal calf on the left she had reflux in the thigh and the common femoral vein and supra vein Fishel vein reflux in the greater saphenous vein. I will have vein and vascular look at this. My thoughts have been that these are likely sickle wounds. I looked through her old records from [facility] wound care center and then when she graduated to [facility] wound care center where she saw Dr. Zigmund Daniel and Dr. Vernona Rieger. Although I can see she had reflux studies done I do not see that she actually saw a vein and vascular. I went over the fact that she had operative  debridements and actual skin grafting that did not take. I do not think these wounds have ever really progressed towards healing 1/31;Substantial wounds on the right ankle area. Hyper granulated very gritty adherent debris on the surface. 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 On the left she has no DVT or SVT deep vein reflux in the common femoral vein. Superficial vein reflux in the greater saphenous vein on although the vein diameter is not really all that large. I do not think there is anything that can be done with these although I am going to send her for consultation to vein and vascular. 2/7; Wound exam; substantial wound area on the right posterior ankle area and areas on the left medial ankle and left lateral ankle. I was able to debride the left medial ankle last week fairly aggressively and it is back this week to a completely nonviable surface She will see vascular surgery this Friday and I would like them to review the venous studies and also any comments on her arterial status. If they do not see an issue here I am going to refer her to plastic surgery for an operative debridement perhaps intraoperative ACell or Integra. Eventually she will require a deep tissue culture again 2/14; substantial wound area on the right posterior ankle, medial ankle. We have been using silver alginate The patient was seen by vein and vascular she had both venous reflux studies and arterial studies. 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 She had no evidence of superficial venous thrombosis. She did have superficial vein reflux at the saphenofemoral junction and the greater saphenous vein. On the left no evidence of a DVT no evidence of superficial venous throat thrombosis she did have deep vein reflux in the common femoral vein and superficial vein reflux in the greater saphenous vein but these were not felt to be amenable to ablation. In terms of arterial studies she had triphasic and wife biphasic wave waveforms bilaterally not felt to have a significant arterial issue. I do not get the feeling that  they felt that any part of her nonhealing wounds were related to either arterial or venous issues. They did note that she had venous reflux at the right at the [facility] and GCV. And also on the left there were reflux in the deep system at the common femoral vein and greater saphenous vein in the proximal thigh. Nothing amenable to ablation. 2/20; she is making some decent progress on the right where there is nice skin between the 2 open areas on the right ankle. The surfaces here do not look viable yet there is some surrounding epithelialization. She still has a small area on the left medial ankle area. Hyper-granulated Jody's away always 2/28 patient has an appointment with plastic surgery on 3/8. We will see her back on 3/9. She may have to call us to get the area redressed. We've been using Santyl under silver alginate. We made a nice improvement on the left medial ankle. The larger wounds on the right also  looks somewhat better in terms of epithelialization although I think they could benefit from an aggressive debridement if plastic surgery would be willing to do that. Perhaps placement of Integra or a cell 07/26/2021: She saw Dr. Claudia Desanctis yesterday. He raised the question as to whether or not this might be pyoderma and wanted to wait until that question was answered by dermatology before proceeding with any sort of operative debridement. We have continue to use Santyl under silver alginate with Kerlix and Coban wraps. Overall, her wounds appear to be continuing to contract and epithelialize, with some granulation tissue present. There continues to be some slough on all wound surfaces. 08/09/2021: She has not been able to get an appointment with dermatology because apparently the offices in [place] do not accept Medicaid. She is looking into whether or not she can be seen at the main [facility] dermatology clinic. This is necessary because plastic surgery is concerned that her  wounds might represent pyoderma and they did not want to do any procedure until that was clarified. We have been using Santyl under silver alginate with Kerlix and Coban wraps. Today, there was a greater amount of drainage on her dressings with a slight green discoloration and significant odor. Despite this, her wounds continue to contract and epithelialize. There is pale granulation tissue present and actually, on the left medial ankle, the granulation tissue is a bit hypertrophic. 08/16/2021: Last week, I took a culture and this grew back rare methicillin-resistant Staph aureus and rare corynebacterium. The MRSA was sensitive to gentamicin which we began applying topically on an empiric basis. This week, her wounds are a bit smaller and the drainage and odor are less. Her primary care provider is working on assisting the patient with a dermatology evaluation. She has been in silver alginate over the gentamicin that was started last week along with Kerlix and Coban wraps. 08/23/2021: Because she has Medicaid, we have been unable to get her into see any dermatologist in the [place] to rule out pyoderma gangrenosum, which was a requirement from plastic surgery prior to any sort of debridement and grafting. Despite this, however, all of her wounds continue to get smaller. The wound on her left medial ankle is nearly closed. There is no odor from the wounds, although she still accumulates a modest amount of drainage on her dressings. 08/30/2021: The lateral right ankle wound and the medial left ankle wound are a bit smaller today. The medial right ankle wound is about the same size. They are less tender. We have still been unable to get her into dermatology. 09/06/2021: All of the wounds are about the same size today. She continues to endorse minimal pain. I communicated with Dr. Claudia Desanctis in plastic surgery regarding our issues getting a dermatology appointment; he was out of town but indicated that he would look  into perhaps performing the biopsy in his office and will have his office contact her. 09/14/2021: The patient has an appointment in dermatology, but it is not until October. Her wounds are roughly the same; she continues to have very thick purulent-looking drainage on her dressings. 09/20/2021: The left medial wound is nearly closed and just has a bit of accumulated eschar on the surface. The right medial and lateral ankle wounds are perhaps a little bit smaller. They continue to have a very pale surface with accumulation of thin slough. 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 I did not think [employer] was indicated  based on this. She is getting topical mupirocin with Prisma silver collagen. 10/04/2021: The patient was not seen in clinic last week due to childcare coverage issues. In the interim, the left medial leg wound has closed. The right sided leg wounds are smaller. There is more granulation tissue coming through, particularly on the lateral wound. The surface remains somewhat gritty. We have been applying topical mupirocin and Prisma silver collagen. 10/11/2021: The left medial leg wound remains closed. She does complain of some anesthetic sensation to the area. Both of the right-sided leg wounds are smaller but still have accumulated slough. 10/18/2021: Both right-sided leg wounds are minimally smaller this week. She still continues to accumulate slough and has thick drainage on her dressings. 10/23/2021: Both wounds continue to contract. There is still slough buildup. She has been approved for a keratin-based skin substitute trial product but it will not be available until next week. 10/30/2021: The wounds are about the same to perhaps slightly smaller. There is still continued slough buildup. Unfortunately, the rep for the keratin based product did not show up today and did not answer his phone when called. 11/08/2021: The wounds are little bit smaller today. She  continues to have thick drainage but the surfaces are relatively clean with just a little bit of slough accumulation. She reported to me today that she is unable to completely flex her left ankle and on examination it seems this is potentially related to scar tissue from her wounds. We do have the ProgenaMatrix trial product available for her today. Electronic Signature(s) Signed: 11/08/2021 11:37:29 AM By: Fredirick Maudlin MD FACS Entered By: Fredirick Maudlin on 11/08/2021 11:37:28 -------------------------------------------------------------------------------- Physical Exam Details Patient Name: Date of Service: [name], [name] 11/08/2021 10:15 A M Medical Record Number: [id number] Patient Account Number: [id number] Date of Birth/Sex: Treating RN: 1970/11/04 (51 y.o. [name] Primary Care Provider: [name] Other Clinician: Referring Provider: Treating Provider/Extender: Elyse Jarvis Weeks in Treatment: 29 Constitutional Hypertensive, asymptomatic. Slightly tachycardic, asymptomatic. . . No acute distress.Marland Kitchen Respiratory Normal work of breathing on room air.. Notes 11/08/2021: The wounds are little bit smaller today. She continues to have thick drainage but the surfaces are relatively clean with just a little bit of slough accumulation. Electronic Signature(s) Signed: 11/08/2021 11:37:59 AM By: Fredirick Maudlin MD FACS Entered By: Fredirick Maudlin on 11/08/2021 11:37:59 -------------------------------------------------------------------------------- Physician Orders Details Patient Name: Date of Service: [name], [name] 11/08/2021 10:15 A M Medical Record Number: [id number] Patient Account Number: [id number] Date of Birth/Sex: Treating RN: [birth year] (51 y.o. [name] Primary Care Provider: [name] Other Clinician: Referring Provider: Treating Provider/Extender: Elyse Jarvis Weeks in Treatment: 34 Verbal /  Phone Orders: No Diagnosis Coding ICD-10 Coding Code Description L97.818 Non-pressure chronic ulcer of other part of right lower leg with other specified severity 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 Follow-up Appointments ppointment in 1 week. - Dr. Celine Ahr - Room 3 Return A Other: - PROGENA MATRIX #1 Cellular or Tissue Based Products Other Cellular or Tissue Based Products Orders/Instructions: - PROGENA MATRIX#1 Bathing/ Shower/ Hygiene May shower with protection but do not get wound dressing(s) wet. - Can get cast protector bags at [facility] or [facility] Edema Control - Lymphedema / SCD / Other Elevate legs to the level of the heart or above for 30 minutes daily and/or when sitting, a frequency of: - throughout the day Avoid standing for long periods of time. Exercise regularly Additional Orders / Instructions Follow Nutritious Diet Wound Treatment  Wound #17 - Lower Leg Wound Laterality: Right, Lateral Cleanser: Soap and Water 1 x Per Week/30 Days Discharge Instructions: May shower and wash wound with dial antibacterial soap and water prior to dressing change. Cleanser: Wound Cleanser 1 x Per Week/30 Days Discharge Instructions: Cleanse the wound with wound cleanser prior to applying a clean dressing using gauze sponges, not tissue or cotton balls. Peri-Wound Care: Triamcinolone 15 (g) 1 x Per Week/30 Days Discharge Instructions: Use triamcinolone 15 (g) as directed Peri-Wound Care: Sween Lotion (Moisturizing lotion) 1 x Per Week/30 Days Discharge Instructions: Apply moisturizing lotion as directed Topical: Mupirocin Ointment 1 x Per Week/30 Days Discharge Instructions: Apply Mupirocin (Bactroban) as instructed Prim Dressing: [place] ary 1 x Per Week/30 Days Secondary Dressing: ABD Pad, 5x9 1 x Per Week/30 Days Discharge Instructions: Apply over primary dressing as directed. Secondary Dressing: Zetuvit Plus 4x8 in 1 x Per Week/30 Days Discharge Instructions: Apply over  primary dressing as directed. Compression Wrap: Kerlix Roll 4.5x3.1 (in/yd) 1 x Per Week/30 Days Discharge Instructions: Apply Kerlix and Coban compression as directed. Compression Wrap: Coban Self-Adherent Wrap 4x5 (in/yd) 1 x Per Week/30 Days Discharge Instructions: Apply over Kerlix as directed. Wound #21 - Ankle Wound Laterality: Right, Medial Cleanser: Soap and Water 1 x Per Week/30 Days Discharge Instructions: May shower and wash wound with dial antibacterial soap and water prior to dressing change. Cleanser: Wound Cleanser 1 x Per Week/30 Days Discharge Instructions: Cleanse the wound with wound cleanser prior to applying a clean dressing using gauze sponges, not tissue or cotton balls. Peri-Wound Care: Triamcinolone 15 (g) 1 x Per Week/30 Days Discharge Instructions: Use triamcinolone 15 (g) as directed Peri-Wound Care: Sween Lotion (Moisturizing lotion) 1 x Per Week/30 Days Discharge Instructions: Apply moisturizing lotion as directed Topical: Mupirocin Ointment 1 x Per Week/30 Days Discharge Instructions: Apply Mupirocin (Bactroban) as instructed Prim Dressing: [place] ary 1 x Per Week/30 Days Secondary Dressing: ABD Pad, 5x9 1 x Per Week/30 Days Discharge Instructions: Apply over primary dressing as directed. Secondary Dressing: Zetuvit Plus 4x8 in 1 x Per Week/30 Days Discharge Instructions: Apply over primary dressing as directed. Compression Wrap: Kerlix Roll 4.5x3.1 (in/yd) 1 x Per Week/30 Days Discharge Instructions: Apply Kerlix and Coban compression as directed. Compression Wrap: Coban Self-Adherent Wrap 4x5 (in/yd) 1 x Per Week/30 Days Discharge Instructions: Apply over Kerlix as directed. Consults Physical Therapy - Assessment and Evaluation of bilateral lower legs - (ICD10 L97.818 - Non-pressure chronic ulcer of other part of right lower leg with other specified severity) Electronic Signature(s) Signed: 11/08/2021 4:08:57 PM By: Fredirick Maudlin MD  FACS Signed: 11/08/2021 5:29:53 PM By: Dellie Catholic RN Previous Signature: 11/08/2021 12:40:23 PM Version By: Fredirick Maudlin MD FACS Entered By: Dellie Catholic on 11/08/2021 16:08:26 Prescription 11/08/2021 -------------------------------------------------------------------------------- Wess Botts MD Patient Name: Provider: [birth year] [id number] Date of Birth: NPI#Wanda Plump [id number] Sex: DEA #: [phone] Phone #: License #: [place] Patient Address: [address] [place] [address] [place] [address] , [place], [place] [address] [phone] Allergies No Known Allergies Provider's Orders Physical Therapy - ICD10: L97.818 - Assessment and Evaluation of bilateral lower legs Hand Signature: Date(s): Electronic Signature(s) Signed: 11/08/2021 4:08:57 PM By: Fredirick Maudlin MD FACS Signed: 11/08/2021 5:29:53 PM By: Dellie Catholic RN Entered By: Dellie Catholic on 11/08/2021 16:08:28 -------------------------------------------------------------------------------- Problem List Details Patient Name: Date of Service: [name], [name] 11/08/2021 10:15 A M Medical Record Number: [id number] Patient Account  Number: [id number] Date of Birth/Sex: Treating RN: [birth year] (51 y.o. [name] Primary Care Provider: [name] Other Clinician: Referring Provider: Treating Provider/Extender: Elyse Jarvis Weeks in Treatment: 29 Active Problems ICD-10 Encounter Code Description Active Date MDM Diagnosis L97.818 Non-pressure chronic ulcer of other part of right lower leg with other specified 04/17/2021 No Yes severity 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 04/17/2021 No Yes Inactive Problems ICD-10 Code Description Active Date Inactive Date L97.828 Non-pressure chronic ulcer of other part of left lower leg with other specified severity 04/17/2021  04/17/2021 Resolved Problems Electronic Signature(s) Signed: 11/08/2021 11:35:50 AM By: Fredirick Maudlin MD FACS Entered By: Fredirick Maudlin on 11/08/2021 11:35:50 -------------------------------------------------------------------------------- Progress Note Details Patient Name: Date of Service: [name], [name] 11/08/2021 10:15 A M Medical Record Number: [id number] Patient Account Number: [id number] Date of Birth/Sex: Treating RN: [birth year] (51 y.o. [name] Primary Care Provider: [name] Other Clinician: Referring Provider: Treating Provider/Extender: Elyse Jarvis Weeks in Treatment: 29 Subjective Chief Complaint Information obtained from Patient the patient is here for evaluation of her bilateral lower extremity sickle cell ulcers 04/17/2021; patient comes in for substantial wounds on the right and left lower leg History of Present Illness (HPI) The following HPI elements were documented for the patient's wound: Location: medial and lateral ankle region on the right and left medial malleolus Quality: Patient reports experiencing a shooting pain to affected area(s). Severity: Patient states wound(s) are getting worse. Duration: right lower extremity bimalleolar ulcers have been present for approximately 2 years; the rright meedial malleolus ulcer has been there proximally 6 months Timing: Pain in wound is constant (hurts all the time) Context: The wound would happen gradually Associated Signs and Symptoms: Patient reports having increase discharge. 51 year old patient with a history of sickle cell anemia who was last seen by me with ulceration of the right lower extremity above the ankle and was referred to Dr. Leland Johns for a surgical debridement as I was unable to do anything in the office due to excruciating pain. At that stage she was referred from the plastic surgery service to dermatology who treated her for a skin infection with  doxycycline and then Levaquin and a local antibiotic ointment. I understand the patient has since developed ulceration on the left ankle both medial and lateral and was now referred back to the wound center as dermatology has finished the management. I do not have any notes from the dermatology department Old notes: 51 year old patient with a history of sickle cell anemia, pain bilateral lower extremities, right lower extremity ulcer and has a history of receiving a skin graft( Theraskin) several months ago. She has been visiting the wound center [facility] and was seen by Dr. Dellia Nims and Dr. Leland Johns. after prolonged conservator therapy between July 2016 and January 2017. She had been seen by the plastic surgeon and taken to the OR for debridement and application of Theraskin. She had 3 applications of Theraskin and was then treated with collagen. Prior to that she had a history of similar problems in 2014 and was treated conservatively. Had a reflux study done for the right lower extremity in August 2016 without reflux or DVT . Past medical history significant for sickle cell disease, anemia, leg ulcers, cholelithiasis,and has never been a smoker. Once the patient was discharged on the wound center she says within 2 or 3 weeks the problems recurred and she has been treating it conservatively. since I saw her 3 weeks ago at [facility] she has  been unable to get her dressing material but has completed a course of doxycycline. 6/7/ 2017 -- lower extremity venous duplex reflux evaluation was done oo No evidence of SVT or DVT in the RLL. No venous incompetence in the RLL. No further vascular workup is indicated at this time. She was seen by Dr. [name], on 10/04/2015. She agreed with the plan of taking her to the OR for debridement and application of theraskin and would also take biopsies to rule out pyoderma gangrenosum. Follow-up note dated May 31 received and she was status post application  of Theraskin to multiple ulcers around the right ankle. Pathology did not show evidence of malignancy or pyoderma gangrenosum. She would continue to see as in the wound clinic for further care and see Dr. Leland Johns as needed. The patient brought the biopsy report and it was consistent with stasis ulcer no evidence of malignancy and the comment was that there was some adjacent neovascularization, fibrosis and patchy perivascular chronic inflammation. 11/15/2015 -- today we applied her first application of Theraskin 11/30/15; TheraSkin #2 12/13/2015 -- she is having a lot of pain locally and is here for possible application of a theraskin today. 01/16/2016 -- the patient has significant pain and has noticed despite in spite of all local care and oral pain medication. It is impossible to debride her in the office. 02/06/2016 -- I do not see any notes from Dr. Iran Planas( the patient has not made a call to the office know as she heard from them) and the only visit to recently was with her PCP Dr. Danella Penton -- I saw her on 01/16/2016 and prescribed 90 tablets of oxycodone 10 mg and did lab work and screening for HIV. the HIV was negative and hemoglobin was 6.3 with a WBC count of 14.9 and hematocrit of 17.8 with platelets of 561. 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 The bimalleolar ulcers to the right lower extremity have been present for approximately 2 years, the left medial malleolus ulcer has been present approximately 6 months. She has followed with Dr.Thimmappa in the past and has had a total of 3 applications of Theraskin (01/2015, 09/2015, 06/17/16). She has also followed with Dr. Con Memos here in the clinic and has received 2 applications of TheraSkin (11/10/15, 11/30/15). The patient does experience chronic, and is not amenable to debridement. She had a sickle cell crisis in  December 2017, prior to that has been several years. She is not currently on any antibiotic therapy and has not been treated with any recently. 07/17/2016 -- was seen by Dr. Iran Planas of plastic surgery who saw her 2 weeks postop application of Theraskin #3. She had removed her dressing and asked her to apply silver alginate on alternate days and follow-up back with the wound center. Future debridements and application of skin substitute would have to be done in the hospital due to her high risk for anesthesia. READMISSION 04/17/2021 Patient is now a 51 year old woman that we have had in this clinic for a prolonged period of time and 2016-2017 and then again for 2 visits in February 2018. At that point she had wounds on the right lower leg predominantly medial. She had also been seen by plastic surgery Dr. Leland Johns who I believe took her to the OR for operative debridement and application of TheraSkin in 2017. After she left our clinic she was followed for a very prolonged period  of time in the wound care center in [facility] who then referred her ultimately to [facility] where she was seen by Dr. Vernona Rieger. Again taken her to the OR for skin grafting which apparently did not take. She had multiple other attempts at dressings although I have not really looked over all of these notes in great detail. She has not been seen in a wound care center in about a year. She states over the last year in addition to her right lower leg she has developed wounds on the left lower leg quite extensive. She is using Xeroform to all of these wounds without really any improvement. She also has Medicaid which does not cover wound products. The patient has had vascular work-ups in the past including most recently on 03/28/2021 showing biphasic waveforms on the right triphasic at the PTA and biphasic at the dorsalis pedis on the left. She was unable to tolerate any degree of compression to do ABIs. Unfortunately TBI's were also not  done. She had venous reflux studies done in 2017. This did not show any evidence of a DVT or SVT and no venous incompetence was noted in the right leg at the time this was the only side with the wound As noted I did not look all over her old records. She apparently had a course of HBO and [facility] although I am not sure what the indication would have been. In any case she developed seizures and terminated treatment earlier. She is generally much more disabled than when we last saw her in clinic. She can no longer walk pretty much wheelchair-bound because predominantly of pain in the left hip. 04/24/2021; the patient tolerated the wraps we put on. We used Santyl and Hydrofera Blue under compression. I brought her back for a nurse visit for a change in dressing. With Medicaid we will have a hard time getting anything paid for and hence the need for compression. She arrives in clinic with all the wounds looking somewhat better in terms of surface 12/20; circumferential wound on the right from the lateral to the medial. She has open areas on the left medial and left lateral x2 on all of this with the same surface. This does not look completely healthy although she does have some epithelialization. She is not complaining of a lot of pain which is unusual for her sickle ulcers. I have not looked over her extensive records from [facility]. She had recent arterial studies and has a history of venous reflux studies I will need to look these over although I do not believe she has significant arterial disease 2023 05/22/2021; patient's wound areas measure slightly smaller. Still a lot of drainage coming from the right we have been using Hydrofera Blue and Santyl with some improvement in the wound surfaces. She tells me she will be getting transfused later in the week for her underlying sickle cell anemia I have looked over her recent arterial studies which were done in the fall. This was in November and showed  biphasic and triphasic waveforms but she could not tolerate ABIs because of pressure and unfortunately TBI's were not done. She has not had recent venous reflux studies that I can see 1/10; not much change about the same surface area. This has a yellowish surface to it very gritty. We have been using Santyl and Hydrofera Blue for a prolonged period. Culture I did last week showed methicillin sensitive staph aureus "rare". Our intake nurse reports greenish drainage which may be the [facility]  Blue itself 1/17; wounds are continue to measure smaller although I am not sure about the accuracy here. Especially the areas on the right are covered in what looks to be a nonviable surface although she does have some epithelialization. Similarly she has areas on the left medial and left lateral ankle area which appear to have a better surface and perhaps are slightly smaller. We have been using Santyl and Hydrofera Blue. She cannot tolerate mechanical debridements She went for her reflux studies which showed significant reflux at the greater saphenous vein at the saphenofemoral junction as well as the greater saphenous vein in the proximal calf on the left she had reflux in the thigh and the common femoral vein and supra vein Fishel vein reflux in the greater saphenous vein. I will have vein and vascular look at this. My thoughts have been that these are likely sickle wounds. I looked through her old records from [facility] wound care center and then when she graduated to [facility] wound care center where she saw Dr. Zigmund Daniel and Dr. Vernona Rieger. Although I can see she had reflux studies done I do not see that she actually saw a vein and vascular. I went over the fact that she had operative debridements and actual skin grafting that did not take. I do not think these wounds have ever really progressed towards healing 1/31;Substantial wounds on the right ankle area. Hyper granulated very gritty adherent debris on  the surface. 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 On the left she has no DVT or SVT deep vein reflux in the common femoral vein. Superficial vein reflux in the greater saphenous vein on although the vein diameter is not really all that large. I do not think there is anything that can be done with these although I am going to send her for consultation to vein and vascular. 2/7; Wound exam; substantial wound area on the right posterior ankle area and areas on the left medial ankle and left lateral ankle. I was able to debride the left medial ankle last week fairly aggressively and it is back this week to a completely nonviable surface She will see vascular surgery this Friday and I would like them to review the venous studies and also any comments on her arterial status. If they do not see an issue here I am going to refer her to plastic surgery for an operative debridement perhaps intraoperative ACell or Integra. Eventually she will require a deep tissue culture again 2/14; substantial wound area on the right posterior ankle, medial ankle. We have been using silver alginate The patient was seen by vein and vascular she had both venous reflux studies and arterial studies. 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 She had no evidence of superficial venous thrombosis. She did have superficial vein reflux at the saphenofemoral junction and the greater saphenous vein. On the left no evidence of a DVT no evidence of superficial venous throat thrombosis she did have deep  vein reflux in the common femoral vein and  superficial vein reflux in the greater saphenous vein but these were not felt to be amenable to ablation. In terms of arterial studies she had triphasic and wife biphasic wave waveforms bilaterally not felt to have a significant arterial issue. I do not get the feeling that they felt that any part of her nonhealing wounds were related to either arterial or venous issues. They did note that she had venous reflux at the right at the [facility] and [facility]. And also on the left there were reflux in the deep system at the common femoral vein and greater saphenous vein in the proximal thigh. Nothing amenable to ablation. 2/20; she is making some decent progress on the right where there is nice skin between the 2 open areas on the right ankle. The surfaces here do not look viable yet there is some surrounding epithelialization. She still has a small area on the left medial ankle area. Hyper-granulated Jody's away always 2/28 patient has an appointment with plastic surgery on 3/8. We will see her back on 3/9. She may have to call us to get the area redressed. We've been using Santyl under silver alginate. We made a nice improvement on the left medial ankle. The larger wounds on the right also looks somewhat better in terms of epithelialization although I think they could benefit from an aggressive debridement if plastic surgery would be willing to do that. Perhaps placement of Integra or a cell 07/26/2021: She saw Dr. Claudia Desanctis yesterday. He raised the question as to whether or not this might be pyoderma and wanted to wait until that question was answered by dermatology before proceeding with any sort of operative debridement. We have continue to use Santyl under silver alginate with Kerlix and Coban wraps. Overall, her wounds appear to be continuing to contract and epithelialize, with some granulation tissue present. There continues to be some slough on all  wound surfaces. 08/09/2021: She has not been able to get an appointment with dermatology because apparently the offices in [place] do not accept Medicaid. She is looking into whether or not she can be seen at the main [facility] dermatology clinic. This is necessary because plastic surgery is concerned that her wounds might represent pyoderma and they did not want to do any procedure until that was clarified. We have been using Santyl under silver alginate with Kerlix and Coban wraps. Today, there was a greater amount of drainage on her dressings with a slight green discoloration and significant odor. Despite this, her wounds continue to contract and epithelialize. There is pale granulation tissue present and actually, on the left medial ankle, the granulation tissue is a bit hypertrophic. 08/16/2021: Last week, I took a culture and this grew back rare methicillin-resistant Staph aureus and rare corynebacterium. The MRSA was sensitive to gentamicin which we began applying topically on an empiric basis. This week, her wounds are a bit smaller and the drainage and odor are less. Her primary care provider is working on assisting the patient with a dermatology evaluation. She has been in silver alginate over the gentamicin that was started last week along with Kerlix and Coban wraps. 08/23/2021: Because she has Medicaid, we have been unable to get her into see any dermatologist in the [place] to rule out pyoderma gangrenosum, which was a requirement from plastic surgery prior to any sort of debridement and grafting. Despite this, however, all of her wounds continue to get smaller. The wound on her left medial ankle is nearly closed.  There is no odor from the wounds, although she still accumulates a modest amount of drainage on her dressings. 08/30/2021: The lateral right ankle wound and the medial left ankle wound are a bit smaller today. The medial right ankle wound is about the same  size. They are less tender. We have still been unable to get her into dermatology. 09/06/2021: All of the wounds are about the same size today. She continues to endorse minimal pain. I communicated with Dr. Claudia Desanctis in plastic surgery regarding our issues getting a dermatology appointment; he was out of town but indicated that he would look into perhaps performing the biopsy in his office and will have his office contact her. 09/14/2021: The patient has an appointment in dermatology, but it is not until October. Her wounds are roughly the same; she continues to have very thick purulent-looking drainage on her dressings. 09/20/2021: The left medial wound is nearly closed and just has a bit of accumulated eschar on the surface. The right medial and lateral ankle wounds are perhaps a little bit smaller. They continue to have a very pale surface with accumulation of thin slough. 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 I did not think [employer] was indicated based on this. She is getting topical mupirocin with Prisma silver collagen. 10/04/2021: The patient was not seen in clinic last week due to childcare coverage issues. In the interim, the left medial leg wound has closed. The right sided leg wounds are smaller. There is more granulation tissue coming through, particularly on the lateral wound. The surface remains somewhat gritty. We have been applying topical mupirocin and Prisma silver collagen. 10/11/2021: The left medial leg wound remains closed. She does complain of some anesthetic sensation to the area. Both of the right-sided leg wounds are smaller but still have accumulated slough. 10/18/2021: Both right-sided leg wounds are minimally smaller this week. She still continues to accumulate slough and has thick drainage on her dressings. 10/23/2021: Both wounds continue to contract. There is still slough buildup. She has been approved for a keratin-based skin substitute trial product  but it will not be available until next week. 10/30/2021: The wounds are about the same to perhaps slightly smaller. There is still continued slough buildup. Unfortunately, the rep for the keratin based product did not show up today and did not answer his phone when called. 11/08/2021: The wounds are little bit smaller today. She continues to have thick drainage but the surfaces are relatively clean with just a little bit of slough accumulation. She reported to me today that she is unable to completely flex her left ankle and on examination it seems this is potentially related to scar tissue from her wounds. We do have the ProgenaMatrix trial product available for her today. Patient History Information obtained from Patient. Family History Diabetes - Mother, Lung Disease - Mother, No family history of Cancer, Heart Disease, Hereditary Spherocytosis, Hypertension, Kidney Disease, Seizures, Stroke, Thyroid Problems, Tuberculosis. Social History Never smoker, Marital Status - Married, Alcohol Use - Never, Drug Use - No History, Caffeine Use - Daily. Medical History Eyes Denies history of Cataracts, Glaucoma, Optic Neuritis Ear/Nose/Mouth/Throat Denies history of Chronic sinus problems/congestion, Middle ear problems Hematologic/Lymphatic Patient has history of Anemia, Sickle Cell Disease Denies history of Hemophilia, Human Immunodeficiency Virus, Lymphedema Respiratory Denies history of Aspiration, Asthma, Chronic Obstructive Pulmonary Disease (COPD), Pneumothorax, Sleep Apnea, Tuberculosis Cardiovascular Denies history of Angina, Arrhythmia, Congestive Heart Failure, Coronary Artery Disease, Deep Vein Thrombosis, Hypertension, Hypotension,  Myocardial Infarction, Peripheral Arterial Disease, Peripheral Venous Disease, Phlebitis, Vasculitis Gastrointestinal Denies history of Cirrhosis , Colitis, Crohnoos, Hepatitis A, Hepatitis B, Hepatitis C Endocrine Denies history of Type I Diabetes, Type  II Diabetes Genitourinary Denies history of End Stage Renal Disease Immunological Denies history of Lupus Erythematosus, Raynaudoos, Scleroderma Integumentary (Skin) Denies history of History of Burn Musculoskeletal Denies history of Gout, Rheumatoid Arthritis, Osteoarthritis, Osteomyelitis Neurologic Patient has history of Neuropathy - right foot intermittant Denies history of Dementia, Quadriplegia, Paraplegia, Seizure Disorder Oncologic Denies history of Received Chemotherapy, Received Radiation Psychiatric Denies history of Anorexia/bulimia, Confinement Anxiety Hospitalization/Surgery History - c section x2. - left breast lumpectomy. - iandD right ankle with theraskin. Medical A Surgical History Notes nd Constitutional Symptoms (General Health) H/O miscarriage Cardiovascular bradycardia Gastrointestinal cholilithiasis Objective Constitutional Hypertensive, asymptomatic. Slightly tachycardic, asymptomatic. No acute distress.. Vitals Time Taken: 10:47 AM, Height: 67 in, Temperature: 98 F, Pulse: 101 bpm, Respiratory Rate: 18 breaths/min, Blood Pressure: 152/79 mmHg. Respiratory Normal work of breathing on room air.. General Notes: 11/08/2021: The wounds are little bit smaller today. She continues to have thick drainage but the surfaces are relatively clean with just a little bit of slough accumulation. Integumentary (Hair, Skin) Wound #17 status is Open. Original cause of wound was Gradually Appeared. The date acquired was: 10/05/2012. The wound has been in treatment 29 weeks. The wound is located on the Right,Lateral Lower Leg. The wound measures 6.5cm length x 4.5cm width x 0.1cm depth; 22.973cm^2 area and 2.297cm^3 volume. There is Fat Layer (Subcutaneous Tissue) exposed. There is no tunneling or undermining noted. There is a medium amount of purulent drainage noted. The wound margin is flat and intact. There is small (1-33%) pink, pale granulation within the wound bed.  There is a large (67-100%) amount of necrotic tissue within the wound bed including Adherent Slough. Wound #21 status is Open. Original cause of wound was Gradually Appeared. The date acquired was: 06/26/2021. The wound has been in treatment 19 weeks. The wound is located on the Right,Medial Ankle. The wound measures 5.8cm length x 2.8cm width x 0.2cm depth; 12.755cm^2 area and 2.551cm^3 volume. There is Fat Layer (Subcutaneous Tissue) exposed. There is no tunneling or undermining noted. There is a medium amount of purulent drainage noted. The wound margin is distinct with the outline attached to the wound base. There is small (1-33%) pink granulation within the wound bed. There is a large (67-100%) amount of necrotic tissue within the wound bed. 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 A skin graft procedure using a bioengineered skin substitute/cellular or tissue based product was performed by Fredirick Maudlin, MD with the following instrument(s): Curette. Other was applied and secured with Staples. 32 sq cm of product was utilized and 0 sq cm was wasted. Post Application, Adaptic;Gauze was applied. A Time Out was conducted at 11:15, prior to the start of the procedure. The procedure was tolerated well with a pain level of 0 throughout and a pain level of 0 following the procedure. Post procedure Diagnosis Wound #17: Same as Pre-Procedure General Notes: **** TRIAL Progena matrix**** do Not charge. Wound #21 Pre-procedure diagnosis of Wound #21 is a Sickle Cell Lesion located on the Right,Medial Ankle. A skin graft procedure using a bioengineered skin substitute/cellular or tissue based product was performed by Fredirick Maudlin, MD  with the following  instrument(s): Curette. Other was applied and secured with Staples. 16 sq cm of product was utilized and 0 sq cm was wasted. Post Application, Adaptic;Gauze was applied. A Time Out was conducted at 11:15, prior to the start of the procedure. The procedure was tolerated well with a pain level of 0 throughout and a pain level of 0 following the procedure. Post procedure Diagnosis Wound #21: Same as Pre-Procedure General Notes: TRIAL Progena -Do not charge. Plan Follow-up Appointments: Return Appointment in 1 week. - Dr. Celine Ahr - Room 3 Other: - PROGENA MATRIX #1 Cellular or Tissue Based Products: Other Cellular or Tissue Based Products Orders/Instructions: - PROGENA MATRIX#1 Bathing/ Shower/ Hygiene: May shower with protection but do not get wound dressing(s) wet. - Can get cast protector bags at [facility] or [facility] Edema Control - Lymphedema / SCD / Other: Elevate legs to the level of the heart or above for 30 minutes daily and/or when sitting, a frequency of: - throughout the day Avoid standing for long periods of time. Exercise regularly Additional Orders / Instructions: Follow Nutritious Diet WOUND #17: - Lower Leg Wound Laterality: Right, Lateral Cleanser: Soap and Water 1 x Per Week/30 Days Discharge Instructions: May shower and wash wound with dial antibacterial soap and water prior to dressing change. Cleanser: Wound Cleanser 1 x Per Week/30 Days Discharge Instructions: Cleanse the wound with wound cleanser prior to applying a clean dressing using gauze sponges, not tissue or cotton balls. Peri-Wound Care: Triamcinolone 15 (g) 1 x Per Week/30 Days Discharge Instructions: Use triamcinolone 15 (g) as directed Peri-Wound Care: Sween Lotion (Moisturizing lotion) 1 x Per Week/30 Days Discharge Instructions: Apply moisturizing lotion as directed Topical: Mupirocin Ointment 1 x Per Week/30 Days Discharge Instructions: Apply Mupirocin (Bactroban) as instructed Prim Dressing: PROGENA MATRIX 1  x Per Week/30 Days ary Secondary Dressing: ABD Pad, 5x9 1 x Per Week/30 Days Discharge Instructions: Apply over primary dressing as directed. Secondary Dressing: Zetuvit Plus 4x8 in 1 x Per Week/30 Days Discharge Instructions: Apply over primary dressing as directed. Com pression Wrap: Kerlix Roll 4.5x3.1 (in/yd) 1 x Per Week/30 Days Discharge Instructions: Apply Kerlix and Coban compression as directed. Com pression Wrap: Coban Self-Adherent Wrap 4x5 (in/yd) 1 x Per Week/30 Days Discharge Instructions: Apply over Kerlix as directed. WOUND #21: - Ankle Wound Laterality: Right, Medial Cleanser: Soap and Water 1 x Per Week/30 Days Discharge Instructions: May shower and wash wound with dial antibacterial soap and water prior to dressing change. Cleanser: Wound Cleanser 1 x Per Week/30 Days Discharge Instructions: Cleanse the wound with wound cleanser prior to applying a clean dressing using gauze sponges, not tissue or cotton balls. Peri-Wound Care: Triamcinolone 15 (g) 1 x Per Week/30 Days Discharge Instructions: Use triamcinolone 15 (g) as directed Peri-Wound Care: Sween Lotion (Moisturizing lotion) 1 x Per Week/30 Days Discharge Instructions: Apply moisturizing lotion as directed Topical: Mupirocin Ointment 1 x Per Week/30 Days Discharge Instructions: Apply Mupirocin (Bactroban) as instructed Prim Dressing: PROGENA MATRIX 1 x Per Week/30 Days ary Secondary Dressing: ABD Pad, 5x9 1 x Per Week/30 Days Discharge Instructions: Apply over primary dressing as directed. Secondary Dressing: Zetuvit Plus 4x8 in 1 x Per Week/30 Days Discharge Instructions: Apply over primary dressing as directed. Com pression Wrap: Kerlix Roll 4.5x3.1 (in/yd) 1 x Per Week/30 Days Discharge Instructions: Apply Kerlix and Coban compression as directed. Com pression Wrap: Coban Self-Adherent Wrap 4x5 (in/yd) 1 x Per Week/30 Days Discharge Instructions: Apply over Kerlix as directed. 11/08/2021: The wounds are little  bit smaller today. She continues to have thick drainage but the surfaces are relatively clean with just a little bit of slough accumulation. I used a curette to debride the slough off of the wound surfaces. We then applied a thin layer of mupirocin ointment followed by the trial skin substitute product (ProgenaMatrix, no charge). This was secured with Adaptic and Steri-Strips. Kerlix and Coban wrap. We will place a referral to physical therapy for her decreased range of motion in her left ankle, secondary to scar tissue. I did encourage her to work her ankle through full range of flexion and extension as well as rotation while she is awaiting physical therapy consultation. Follow-up in 1 week. Electronic Signature(s) Signed: 11/08/2021 11:40:08 AM By: Fredirick Maudlin MD FACS Entered By: Fredirick Maudlin on 11/08/2021 11:40:08 -------------------------------------------------------------------------------- HxROS Details Patient Name: Date of Service: [name], [name] 11/08/2021 10:15 A M Medical Record Number: [id number] Patient Account Number: [id number] Date of Birth/Sex: Treating RN: 02-25-1971 (51 y.o. [name] Primary Care Provider: [name] Other Clinician: Referring Provider: Treating Provider/Extender: Elyse Jarvis Weeks in Treatment: 29 Information Obtained From Patient Constitutional Symptoms (General Health) Medical History: Past Medical History Notes: H/O miscarriage Eyes Medical History: Negative for: Cataracts; Glaucoma; Optic Neuritis Ear/Nose/Mouth/Throat Medical History: Negative for: Chronic sinus problems/congestion; Middle ear problems Hematologic/Lymphatic Medical History: Positive for: Anemia; Sickle Cell Disease Negative for: Hemophilia; Human Immunodeficiency Virus; Lymphedema Respiratory Medical History: Negative for: Aspiration; Asthma; Chronic Obstructive Pulmonary Disease (COPD); Pneumothorax; Sleep Apnea;  Tuberculosis Cardiovascular Medical History: Negative for: Angina; Arrhythmia; Congestive Heart Failure; Coronary Artery Disease; Deep Vein Thrombosis; Hypertension; Hypotension; Myocardial Infarction; Peripheral Arterial Disease; Peripheral Venous Disease; Phlebitis; Vasculitis Past Medical History Notes: bradycardia Gastrointestinal Medical History: Negative for: Cirrhosis ; Colitis; Crohns; Hepatitis A; Hepatitis B; Hepatitis C Past Medical History Notes: cholilithiasis Endocrine Medical History: Negative for: Type I Diabetes; Type II Diabetes Genitourinary Medical History: Negative for: End Stage Renal Disease Immunological Medical History: Negative for: Lupus Erythematosus; Raynauds; Scleroderma Integumentary (Skin) Medical History: Negative for: History of Burn Musculoskeletal Medical History: Negative for: Gout; Rheumatoid Arthritis; Osteoarthritis; Osteomyelitis Neurologic Medical History: Positive for: Neuropathy - right foot intermittant Negative for: Dementia; Quadriplegia; Paraplegia; Seizure Disorder Oncologic Medical History: Negative for: Received Chemotherapy; Received Radiation Psychiatric Medical History: Negative for: Anorexia/bulimia; Confinement Anxiety Immunizations Pneumococcal Vaccine: Received Pneumococcal Vaccination: No Implantable Devices None Hospitalization / Surgery History Type of Hospitalization/Surgery c section x2 left breast lumpectomy iandD right ankle with theraskin Family and Social History Cancer: No; Diabetes: Yes - Mother; Heart Disease: No; Hereditary Spherocytosis: No; Hypertension: No; Kidney Disease: No; Lung Disease: Yes - Mother; Seizures: No; Stroke: No; Thyroid Problems: No; Tuberculosis: No; Never smoker; Marital Status - Married; Alcohol Use: Never; Drug Use: No History; Caffeine Use: Daily; Financial Concerns: No; Food, Clothing or Shelter Needs: No; Support System Lacking: No; Transportation Concerns:  No Engineer, maintenance) Signed: 11/08/2021 12:40:23 PM By: Fredirick Maudlin MD FACS Signed: 11/08/2021 5:29:53 PM By: Dellie Catholic RN Entered By: Fredirick Maudlin on 11/08/2021 11:37:34 -------------------------------------------------------------------------------- SuperBill Details Patient Name: Date of Service: [place], [name] 11/08/2021 Medical Record Number: [id number] Patient Account Number: [id number] Date of Birth/Sex: Treating RN: [birth year] (50 y.o. 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818 Non-pressure chronic ulcer of other part of right lower leg with other specified severity 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

## 2021-11-08 NOTE — Progress Notes (Signed)
TRESSIA, LABRUM ([id number]) Visit Report for 11/08/2021 Arrival Information Details Patient Name: Date of Service: [name] [name] 11/08/2021 10:15 A M Medical Record Number: [id number] Patient Account Number: [id number] Date of Birth/Sex: Treating RN: 1970/07/13 (51 y.o. Martyn Malay, [name] Primary Care Kalysta Kneisley: [name] Other Clinician: Referring Oswald Pott: Treating Cherry Wittwer/Extender: Elyse Jarvis Weeks in Treatment: 29 Visit Information History Since Last Visit Added or deleted any medications: No Patient Arrived: Walker Any new allergies or adverse reactions: No Arrival Time: 10:39 Had a fall or experienced change in No Accompanied By: sister activities of daily living that may affect Transfer Assistance: None risk of falls: Patient Identification Verified: Yes Signs or symptoms of abuse/neglect since last visito No Secondary Verification Process Completed: Yes Hospitalized since last visit: No Patient Requires Transmission-Based Precautions: No Implantable device outside of the clinic excluding No Patient Has Alerts: No cellular tissue based products placed in the center since last visit: Has Dressing in Place as Prescribed: Yes Has Compression in Place as Prescribed: Yes Pain Present Now: No Electronic Signature(s) Signed: 11/08/2021 5:01:25 PM By: Baruch Gouty RN, BSN Entered By: Baruch Gouty on 11/08/2021 10:44:23 -------------------------------------------------------------------------------- Encounter Discharge Information Details Patient Name: Date of Service: [name], [name] 11/08/2021 10:15 A M Medical Record Number: [id number] Patient Account Number: [id number] Date of Birth/Sex: Treating RN: 1970/08/29 (51 y.o. [name] Primary Care Donye Dauenhauer: [name] Other Clinician: Referring Zorina Mallin: Treating Ilanna Deihl/Extender: Elyse Jarvis Weeks in Treatment: 29 Encounter Discharge Information  Items Post Procedure Vitals Discharge Condition: Stable Temperature (F): 98 Ambulatory Status: Walker Pulse (bpm): 101 Discharge Destination: Home Respiratory Rate (breaths/min): 18 Transportation: Private Auto Blood Pressure (mmHg): 152/79 Accompanied By: self Schedule Follow-up Appointment: Yes Clinical Summary of Care: Patient Declined Electronic Signature(s) Signed: 11/08/2021 5:07:36 PM By: Adline Peals Entered By: Adline Peals on 11/08/2021 16:00:55 -------------------------------------------------------------------------------- Lower Extremity Assessment Details Patient Name: Date of Service: [name] [name] 11/08/2021 10:15 A M Medical Record Number: [id number] Patient Account Number: [id number] Date of Birth/Sex: Treating RN: 18-Jun-1970 (51 y.o. [name] Primary Care Mong Neal: [name] Other Clinician: Referring Detravion Tester: Treating Melat Wrisley/Extender: Elyse Jarvis Weeks in Treatment: 29 Edema Assessment Assessed: [Left: No] [Right: No] Edema: [Left: Ye] [Right: s] Calf Left: Right: Point of Measurement: 33 cm From Medial Instep 30.5 cm Ankle Left: Right: Point of Measurement: 10 cm From Medial Instep 20 cm Vascular Assessment Pulses: Dorsalis Pedis Palpable: [Right:Yes] Electronic Signature(s) Signed: 11/08/2021 5:01:25 PM By: Baruch Gouty RN, BSN Entered By: Baruch Gouty on 11/08/2021 10:50:32 -------------------------------------------------------------------------------- Multi Wound Chart Details Patient Name: Date of Service: [name] [name], [name] 11/08/2021 10:15 A M Medical Record Number: [id number] Patient Account Number: [id number] Date of Birth/Sex: Treating RN: [birth year] (51 y.o. [name] Primary Care Jannell Franta: [name] Other Clinician: Referring Eren Ryser: Treating Maurya Nethery/Extender: Elyse Jarvis Weeks in Treatment: 29 Vital Signs Height(in):  67 Pulse(bpm): 101 Weight(lbs): Blood Pressure(mmHg): 152/79 Body Mass Index(BMI): Temperature(F): 98 Respiratory Rate(breaths/min): 18 Photos: [N/A:N/A] Right, Lateral Lower Leg Right, Medial Ankle N/A Wound Location: Gradually Appeared Gradually Appeared N/A Wounding Event: Sickle Cell Lesion Sickle Cell Lesion N/A Primary Etiology: N/A Venous Leg Ulcer N/A Secondary Etiology: Anemia, Sickle Cell Disease, Anemia, Sickle Cell Disease, N/A Comorbid History: Neuropathy Neuropathy 10/05/2012 06/26/2021 N/A Date Acquired: 29 19 N/A Weeks of Treatment: Open Open N/A Wound Status: No No N/A Wound Recurrence: Yes No N/A Clustered Wound: 6.5x4.5x0.1 5.8x2.8x0.2 N/A Measurements L x W x D (cm) 22.973 12.755  N/A A (cm) : rea 2.297 2.551 N/A Volume (cm) : 87.80% 56.00% N/A % Reduction in Area: 87.80% 12.00% N/A % Reduction in Volume: Full Thickness Without Exposed Full Thickness Without Exposed N/A Classification: Support Structures Support Structures Medium Medium N/A Exudate Amount: Purulent Purulent N/A Exudate Type: yellow, brown, green yellow, brown, green N/A Exudate Color: Flat and Intact Distinct, outline attached N/A Wound Margin: Small (1-33%) Small (1-33%) N/A Granulation Amount: Pink, Pale Pink N/A Granulation Quality: Large (67-100%) Large (67-100%) N/A Necrotic Amount: Fat Layer (Subcutaneous Tissue): Yes Fat Layer (Subcutaneous Tissue): Yes N/A Exposed Structures: Fascia: No Fascia: No Tendon: No Tendon: No Muscle: No Muscle: No Joint: No Joint: No Bone: No Bone: No Small (1-33%) Small (1-33%) N/A Epithelialization: Cellular or Tissue Based Product Cellular or Tissue Based Product N/A Procedures Performed: Treatment Notes Electronic Signature(s) Signed: 11/08/2021 11:35:59 AM By: Fredirick Maudlin MD FACS Signed: 11/08/2021 5:29:53 PM By: Dellie Catholic RN Entered By: Fredirick Maudlin on 11/08/2021  11:35:59 -------------------------------------------------------------------------------- Multi-Disciplinary Care Plan Details Patient Name: Date of Service: [name] [name], [name] 11/08/2021 10:15 A M Medical Record Number: [id number] Patient Account Number: [id number] Date of Birth/Sex: Treating RN: [birth year] (51 y.o. [name] Primary Care Conner Muegge: [name] Other Clinician: Referring Hydeia Mcatee: Treating Amoni Scallan/Extender: Elyse Jarvis Weeks in Treatment: 29 Multidisciplinary Care Plan reviewed with physician Active Inactive Venous Leg Ulcer Nursing Diagnoses: Actual venous Insuffiency (use after diagnosis is confirmed) Knowledge deficit related to disease process and management Goals: Patient will maintain optimal edema control Date Initiated: 06/26/2021 Target Resolution Date: 11/16/2021 Goal Status: Active Interventions: Assess peripheral edema status every visit. Compression as ordered Treatment Activities: Therapeutic compression applied : 06/26/2021 Notes: Wound/Skin Impairment Nursing Diagnoses: Impaired tissue integrity Goals: Patient/caregiver will verbalize understanding of skin care regimen Date Initiated: 04/17/2021 Target Resolution Date: 11/16/2021 Goal Status: Active Ulcer/skin breakdown will have a volume reduction of 30% by week 4 Date Initiated: 04/17/2021 Date Inactivated: 05/29/2021 Target Resolution Date: 05/15/2021 Goal Status: Met Ulcer/skin breakdown will have a volume reduction of 50% by week 8 Date Initiated: 05/29/2021 Date Inactivated: 06/26/2021 Target Resolution Date: 06/26/2021 Goal Status: Unmet Unmet Reason: venous reflux Interventions: Assess patient/caregiver ability to obtain necessary supplies Assess patient/caregiver ability to perform ulcer/skin care regimen upon admission and as needed Assess ulceration(s) every visit Provide education on ulcer and skin care Treatment Activities: Topical wound  management initiated : 04/17/2021 Notes: 06/08/21: Left leg wounds greater than 30% volume reduction, right leg acute infection. Electronic Signature(s) Signed: 11/08/2021 5:01:25 PM By: Baruch Gouty RN, BSN Entered By: Baruch Gouty on 11/08/2021 10:58:49 -------------------------------------------------------------------------------- Pain Assessment Details Patient Name: Date of Service: [name], [name] 11/08/2021 10:15 A M Medical Record Number: [id number] Patient Account Number: [id number] Date of Birth/Sex: Treating RN: 12-21-1970 (51 y.o. [name] Primary Care Daulton Harbaugh: [name] Other Clinician: Referring Vershawn Westrup: Treating Morgen Linebaugh/Extender: Elyse Jarvis Weeks in Treatment: 29 Active Problems Location of Pain Severity and Description of Pain Patient Has Paino No Site Locations Rate the pain. Current Pain Level: 0 Pain Management and Medication Current Pain Management: Electronic Signature(s) Signed: 11/08/2021 5:01:25 PM By: Baruch Gouty RN, BSN Entered By: Baruch Gouty on 11/08/2021 10:48:16 -------------------------------------------------------------------------------- Patient/Caregiver Education Details Patient Name: Date of Service: [name] [name], [name] 6/22/2023andnbsp10:15 [place] Record Number: [id number] Patient Account Number: [id number] Date of Birth/Gender: Treating RN: 08-26-1970 (51 y.o. [name] Primary Care Physician: [name] Other Clinician: Referring Physician: Treating Physician/Extender: Elyse Jarvis Weeks in Treatment:  68 Education Assessment Education Provided To: Patient Education Topics Provided Venous: Methods: Explain/Verbal Responses: Reinforcements needed, State content correctly Wound/Skin Impairment: Methods: Explain/Verbal Responses: Reinforcements needed, State content correctly Electronic Signature(s) Signed: 11/08/2021 5:01:25 PM By:  Baruch Gouty RN, BSN Entered By: Baruch Gouty on 11/08/2021 10:59:15 -------------------------------------------------------------------------------- Wound Assessment Details Patient Name: Date of Service: [name], [name] 11/08/2021 10:15 A M Medical Record Number: [id number] Patient Account Number: [id number] Date of Birth/Sex: Treating RN: 06-20-1970 (51 y.o. [name] Primary Care Azka Steger: [name] Other Clinician: Referring Ryken Paschal: Treating Ahsan Esterline/Extender: Elyse Jarvis Weeks in Treatment: 29 Wound Status Wound Number: 17 Primary Etiology: Sickle Cell Lesion Wound Location: Right, Lateral Lower Leg Wound Status: Open Wounding Event: Gradually Appeared Comorbid History: Anemia, Sickle Cell Disease, Neuropathy Date Acquired: 10/05/2012 Weeks Of Treatment: 29 Clustered Wound: Yes Photos Wound Measurements Length: (cm) 6.5 Width: (cm) 4.5 Depth: (cm) 0.1 Area: (cm) 22.973 Volume: (cm) 2.297 % Reduction in Area: 87.8% % Reduction in Volume: 87.8% Epithelialization: Small (1-33%) Tunneling: No Undermining: No Wound Description Classification: Full Thickness Without Exposed Support Structures Wound Margin: Flat and Intact Exudate Amount: Medium Exudate Type: Purulent Exudate Color: yellow, brown, green Foul Odor After Cleansing: No Slough/Fibrino Yes Wound Bed Granulation Amount: Small (1-33%) Exposed Structure Granulation Quality: Pink, Pale Fascia Exposed: No Necrotic Amount: Large (67-100%) Fat Layer (Subcutaneous Tissue) Exposed: Yes Necrotic Quality: Adherent Slough Tendon Exposed: No Muscle Exposed: No Joint Exposed: No Bone Exposed: No Treatment Notes Wound #17 (Lower Leg) Wound Laterality: Right, Lateral Cleanser Soap and Water Discharge Instruction: May shower and wash wound with dial antibacterial soap and water prior to dressing change. Wound Cleanser Discharge Instruction: Cleanse the wound with  wound cleanser prior to applying a clean dressing using gauze sponges, not tissue or cotton balls. Peri-Wound Care Triamcinolone 15 (g) Discharge Instruction: Use triamcinolone 15 (g) as directed Sween Lotion (Moisturizing lotion) Discharge Instruction: Apply moisturizing lotion as directed Topical Mupirocin Ointment Discharge Instruction: Apply Mupirocin (Bactroban) as instructed Primary Dressing PROGENA MATRIX Secondary Dressing ABD Pad, 5x9 Discharge Instruction: Apply over primary dressing as directed. Zetuvit Plus 4x8 in Discharge Instruction: Apply over primary dressing as directed. Secured With Compression Wrap Kerlix Roll 4.5x3.1 (in/yd) Discharge Instruction: Apply Kerlix and Coban compression as directed. Coban Self-Adherent Wrap 4x5 (in/yd) Discharge Instruction: Apply over Kerlix as directed. Compression Stockings Add-Ons Electronic Signature(s) Signed: 11/08/2021 5:01:25 PM By: Baruch Gouty RN, BSN Entered By: Baruch Gouty on 11/08/2021 10:57:41 -------------------------------------------------------------------------------- Wound Assessment Details Patient Name: Date of Service: [name], [name] 11/08/2021 10:15 A M Medical Record Number: [id number] Patient Account Number: [id number] Date of Birth/Sex: Treating RN: [birth year] (51 y.o. [name] Primary Care Jerrick Farve: [name] Other Clinician: Referring Verlean Allport: Treating Ova Meegan/Extender: Elyse Jarvis Weeks in Treatment: 29 Wound Status Wound Number: 21 Primary Etiology: Sickle Cell Lesion Wound Location: Right, Medial Ankle Secondary Etiology: Venous Leg Ulcer Wounding Event: Gradually Appeared Wound Status: Open Date Acquired: 06/26/2021 Comorbid History: Anemia, Sickle Cell Disease, Neuropathy Weeks Of Treatment: 19 Clustered Wound: No Photos Wound Measurements Length: (cm) 5.8 Width: (cm) 2.8 Depth: (cm) 0.2 Area: (cm) 12.755 Volume: (cm) 2.551 %  Reduction in Area: 56% % Reduction in Volume: 12% Epithelialization: Small (1-33%) Tunneling: No Undermining: No Wound Description Classification: Full Thickness Without Exposed Support Structures Wound Margin: Distinct, outline attached Exudate Amount: Medium Exudate Type: Purulent Exudate Color: yellow, brown, green Foul Odor After Cleansing: No Slough/Fibrino Yes Wound Bed Granulation Amount: Small (1-33%) Exposed Structure Granulation Quality: Pink  Fascia Exposed: No Necrotic Amount: Large (67-100%) Fat Layer (Subcutaneous Tissue) Exposed: Yes Tendon Exposed: No Muscle Exposed: No Joint Exposed: No Bone Exposed: No Treatment Notes Wound #21 (Ankle) Wound Laterality: Right, Medial Cleanser Soap and Water Discharge Instruction: May shower and wash wound with dial antibacterial soap and water prior to dressing change. Wound Cleanser Discharge Instruction: Cleanse the wound with wound cleanser prior to applying a clean dressing using gauze sponges, not tissue or cotton balls. Peri-Wound Care Triamcinolone 15 (g) Discharge Instruction: Use triamcinolone 15 (g) as directed Sween Lotion (Moisturizing lotion) Discharge Instruction: Apply moisturizing lotion as directed Topical Mupirocin Ointment Discharge Instruction: Apply Mupirocin (Bactroban) as instructed Primary Dressing PROGENA MATRIX Secondary Dressing ABD Pad, 5x9 Discharge Instruction: Apply over primary dressing as directed. Zetuvit Plus 4x8 in Discharge Instruction: Apply over primary dressing as directed. Secured With Compression Wrap Kerlix Roll 4.5x3.1 (in/yd) Discharge Instruction: Apply Kerlix and Coban compression as directed. Coban Self-Adherent Wrap 4x5 (in/yd) Discharge Instruction: Apply over Kerlix as directed. Compression Stockings Add-Ons Electronic Signature(s) Signed: 11/08/2021 5:01:25 PM By: Baruch Gouty RN, BSN Entered By: Baruch Gouty on 11/08/2021  10:58:22 -------------------------------------------------------------------------------- [place] Details Patient Name: Date of Service: [name], [name] 11/08/2021 10:15 A M Medical Record Number: [id number] Patient Account Number: [id number] Date of Birth/Sex: Treating RN: [birth year] (51 y.o. [name] Primary Care Grae Leathers: [name] Other Clinician: Referring Raji Glinski: Treating Esther Bradstreet/Extender: Elyse Jarvis Weeks in Treatment: 29 Vital Signs Time Taken: 10:47 Temperature (F): 98 Height (in): 67 Pulse (bpm): 101 Respiratory Rate (breaths/min): 18 Blood Pressure (mmHg): 152/79 Reference Range: 80 - 120 mg / dl Electronic Signature(s) Signed: 11/08/2021 5:01:25 PM By: Baruch Gouty RN, BSN Entered By: Baruch Gouty on 11/08/2021 10:47:07

## 2021-11-15 ENCOUNTER — Encounter (HOSPITAL_BASED_OUTPATIENT_CLINIC_OR_DEPARTMENT_OTHER): Payer: Medicaid Other | Admitting: General Surgery

## 2021-11-15 DIAGNOSIS — L97812 Non-pressure chronic ulcer of other part of right lower leg with fat layer exposed: Secondary | ICD-10-CM | POA: Diagnosis not present

## 2021-11-15 NOTE — Progress Notes (Signed)
[name], [name] ([id number]) Visit Report for 11/15/2021 Arrival Information Details Patient Name: Date of Service: [name] [name] 11/15/2021 10:15 A M Medical Record Number: [id number] Patient Account Number: [id number] Date of Birth/Sex: Treating RN: 01/31/1971 (51 y.o. [name] Primary Care Kolsen Choe: [name] Other Clinician: Referring Tashi Band: Treating Dontasia Miranda/Extender: Elyse Jarvis Weeks in Treatment: 66 Visit Information History Since Last Visit Added or deleted any medications: No Patient Arrived: [name] Any new allergies or adverse reactions: No Arrival Time: 10:35 Had a fall or experienced change in No Accompanied By: sister activities of daily living that may affect Transfer Assistance: None risk of falls: Patient Identification Verified: Yes Signs or symptoms of abuse/neglect since last visito No Patient Requires Transmission-Based Precautions: No Hospitalized since last visit: No Patient Has Alerts: No Implantable device outside of the clinic excluding No cellular tissue based products placed in the center since last visit: Has Dressing in Place as Prescribed: Yes Pain Present Now: No Electronic Signature(s) Signed: 11/15/2021 6:30:20 PM By: Dellie Catholic RN Entered By: Dellie Catholic on 11/15/2021 10:36:22 -------------------------------------------------------------------------------- Encounter Discharge Information Details Patient Name: Date of Service: [name], [name] 11/15/2021 10:15 A M Medical Record Number: [id number] Patient Account Number: [id number] Date of Birth/Sex: Treating RN: Oct 29, 1970 (51 y.o. [name] Primary Care Katey Barrie: [name] Other Clinician: Referring Madison Direnzo: Treating Margarett Viti/Extender: Elyse Jarvis Weeks in Treatment: 30 Encounter Discharge Information Items Post Procedure Vitals Discharge Condition: Stable Temperature (F): 98.4 Ambulatory Status:  Walker Pulse (bpm): 80 Discharge Destination: Home Respiratory Rate (breaths/min): 18 Transportation: Private Auto Blood Pressure (mmHg): 135/77 Accompanied By: sister Schedule Follow-up Appointment: Yes Clinical Summary of Care: Patient Declined Electronic Signature(s) Signed: 11/15/2021 6:30:20 PM By: Dellie Catholic RN Entered By: Dellie Catholic on 11/15/2021 18:15:36 -------------------------------------------------------------------------------- Lower Extremity Assessment Details Patient Name: Date of Service: [name] [name] 11/15/2021 10:15 A M Medical Record Number: [id number] Patient Account Number: [id number] Date of Birth/Sex: Treating RN: 09-20-1970 (51 y.o. [name] Primary Care Paulena Servais: [name] Other Clinician: Referring Logon Uttech: Treating Ifeanyi Mickelson/Extender: Elyse Jarvis Weeks in Treatment: 30 Edema Assessment Assessed: [Left: No] [Right: No] Edema: [Left: Ye] [Right: s] Calf Left: Right: Point of Measurement: 33 cm From Medial Instep 30.5 cm Ankle Left: Right: Point of Measurement: 10 cm From Medial Instep 19.5 cm Electronic Signature(s) Signed: 11/15/2021 6:30:20 PM By: Dellie Catholic RN Entered By: Dellie Catholic on 11/15/2021 10:56:39 -------------------------------------------------------------------------------- Multi Wound Chart Details Patient Name: Date of Service: [name], [name] 11/15/2021 10:15 A M Medical Record Number: [id number] Patient Account Number: [id number] Date of Birth/Sex: Treating RN: 1970-12-01 (51 y.o. [name] Primary Care Dajana Gehrig: [name] Other Clinician: Referring Cohl Behrens: Treating Jin Shockley/Extender: Elyse Jarvis Weeks in Treatment: 30 Vital Signs Height(in): 67 Pulse(bpm): 80 Weight(lbs): Blood Pressure(mmHg): 135/77 Body Mass Index(BMI): Temperature(F): 98.4 Respiratory Rate(breaths/min): 18 Photos: [N/A:N/A] Right, Lateral  Lower Leg Right, Medial Ankle N/A Wound Location: Gradually Appeared Gradually Appeared N/A Wounding Event: Sickle Cell Lesion Sickle Cell Lesion N/A Primary Etiology: N/A Venous Leg Ulcer N/A Secondary Etiology: Anemia, Sickle Cell Disease, Anemia, Sickle Cell Disease, N/A Comorbid History: Neuropathy Neuropathy 10/05/2012 06/26/2021 N/A Date Acquired: 30 20 N/A Weeks of Treatment: Open Open N/A Wound Status: No No N/A Wound Recurrence: Yes No N/A Clustered Wound: 6x4.5x0.1 4.8x2.7x0.1 N/A Measurements L x W x D (cm) 21.206 10.179 N/A A (cm) : rea 2.121 1.018 N/A Volume (cm) : 88.70% 64.90% N/A % Reduction in Area: 88.70% 64.90%  N/A % Reduction in Volume: Full Thickness Without Exposed Full Thickness Without Exposed N/A Classification: Support Structures Support Structures Medium Medium N/A Exudate A mount: Purulent Purulent N/A Exudate Type: yellow, [name], green yellow, [name], green N/A Exudate Color: Flat and Intact Distinct, outline attached N/A Wound Margin: Small (1-33%) Small (1-33%) N/A Granulation A mount: Pink, Pale Pink N/A Granulation Quality: Large (67-100%) Large (67-100%) N/A Necrotic A mount: Fat Layer (Subcutaneous Tissue): Yes Fat Layer (Subcutaneous Tissue): Yes N/A Exposed Structures: Fascia: No Fascia: No Tendon: No Tendon: No Muscle: No Muscle: No Joint: No Joint: No Bone: No Bone: No Small (1-33%) Small (1-33%) N/A Epithelialization: Debridement - Selective/Open Wound Debridement - Selective/Open Wound N/A Debridement: Pre-procedure Verification/Time Out 11:02 11:02 N/A Taken: Lidocaine 5% topical ointment Lidocaine 5% topical ointment N/A Pain Control: [facility] N/A Tissue Debrided: Non-Viable Tissue Non-Viable Tissue N/A Level: 27 12.96 N/A Debridement A (sq cm): rea Curette Curette N/A Instrument: Minimum Minimum N/A Bleeding: Pressure Pressure N/A Hemostasis A chieved: 0 0 N/A Procedural Pain: 0 0  N/A Post Procedural Pain: Procedure was tolerated well Procedure was tolerated well N/A Debridement Treatment Response: 6x4.5x0.1 4.8x2.7x0.1 N/A Post Debridement Measurements L x W x D (cm) 2.121 1.018 N/A Post Debridement Volume: (cm) Debridement Debridement N/A Procedures Performed: Treatment Notes Electronic Signature(s) Signed: 11/15/2021 11:18:41 AM By: Fredirick Maudlin MD FACS Signed: 11/15/2021 6:30:20 PM By: Dellie Catholic RN Entered By: Fredirick Maudlin on 11/15/2021 11:18:40 -------------------------------------------------------------------------------- Multi-Disciplinary Care Plan Details Patient Name: Date of Service: [name], [name] 11/15/2021 10:15 A M Medical Record Number: [id number] Patient Account Number: [id number] Date of Birth/Sex: Treating RN: 30-Jun-1970 (51 y.o. [name] Primary Care Emelda Kohlbeck: [name] Other Clinician: Referring Marien Manship: Treating Prudence Heiny/Extender: Elyse Jarvis Weeks in Treatment: 49 Multidisciplinary Care Plan reviewed with physician Active Inactive Venous Leg Ulcer Nursing Diagnoses: Actual venous Insuffiency (use after diagnosis is confirmed) Knowledge deficit related to disease process and management Goals: Patient will maintain optimal edema control Date Initiated: 06/26/2021 Target Resolution Date: 11/16/2021 Goal Status: Active Interventions: Assess peripheral edema status every visit. Compression as ordered Treatment Activities: Therapeutic compression applied : 06/26/2021 Notes: Wound/Skin Impairment Nursing Diagnoses: Impaired tissue integrity Goals: Patient/caregiver will verbalize understanding of skin care regimen Date Initiated: 04/17/2021 Target Resolution Date: 11/16/2021 Goal Status: Active Ulcer/skin breakdown will have a volume reduction of 30% by week 4 Date Initiated: 04/17/2021 Date Inactivated: 05/29/2021 Target Resolution Date: 05/15/2021 Goal Status:  Met Ulcer/skin breakdown will have a volume reduction of 50% by week 8 Date Initiated: 05/29/2021 Date Inactivated: 06/26/2021 Target Resolution Date: 06/26/2021 Goal Status: Unmet Unmet Reason: venous reflux Interventions: Assess patient/caregiver ability to obtain necessary supplies Assess patient/caregiver ability to perform ulcer/skin care regimen upon admission and as needed Assess ulceration(s) every visit Provide education on ulcer and skin care Treatment Activities: Topical wound management initiated : 04/17/2021 Notes: 06/08/21: Left leg wounds greater than 30% volume reduction, right leg acute infection. Electronic Signature(s) Signed: 11/15/2021 6:30:20 PM By: Dellie Catholic RN Entered By: Dellie Catholic on 11/15/2021 18:14:13 -------------------------------------------------------------------------------- Pain Assessment Details Patient Name: Date of Service: [name],  [name] 11/15/2021 10:15 A M Medical Record Number: [id number] Patient Account Number: [id number] Date of Birth/Sex: Treating RN: 1971-02-14 (51 y.o. [name] Primary Care Ha Placeres: [name] Other Clinician: Referring Chrishauna Mee: Treating Fumi Guadron/Extender: Elyse Jarvis Weeks in Treatment: 30 Active Problems Location of Pain Severity and Description of Pain Patient Has Paino No Site Locations Pain Management and Medication Current Pain Management: Electronic Signature(s)  Signed: 11/15/2021 6:30:20 PM By: Dellie Catholic RN Entered By: Dellie Catholic on 11/15/2021 10:36:51 -------------------------------------------------------------------------------- Patient/Caregiver Education Details Patient Name: Date of Service: [name], [name] 6/29/2023andnbsp10:15 [place] Record Number: [id number] Patient Account Number: [id number] Date of Birth/Gender: Treating RN: 04-20-1971 (51 y.o. [name] Primary Care Physician: [name] Other  Clinician: Referring Physician: Treating Physician/Extender: Hilarie Fredrickson in Treatment: 30 Education Assessment Education Provided To: Patient Education Topics Provided Electronic Signature(s) Signed: 11/15/2021 6:30:20 PM By: Dellie Catholic RN Entered By: Dellie Catholic on 11/15/2021 18:14:23 -------------------------------------------------------------------------------- Wound Assessment Details Patient Name: Date of Service: [name], Nevis [name] 11/15/2021 10:15 A M Medical Record Number: [id number] Patient Account Number: [id number] Date of Birth/Sex: Treating RN: 1971/01/28 (51 y.o. [name] Primary Care Alixis Harmon: [name] Other Clinician: Referring Modean Mccullum: Treating Elasia Furnish/Extender: Elyse Jarvis Weeks in Treatment: 30 Wound Status Wound Number: 17 Primary Etiology: Sickle Cell Lesion Wound Location: Right, Lateral Lower Leg Wound Status: Open Wounding Event: Gradually Appeared Comorbid History: Anemia, Sickle Cell Disease, Neuropathy Date Acquired: 10/05/2012 Weeks Of Treatment: 30 Clustered Wound: Yes Photos Wound Measurements Length: (cm) 6 Width: (cm) 4.5 Depth: (cm) 0.1 Area: (cm) 21.206 Volume: (cm) 2.121 % Reduction in Area: 88.7% % Reduction in Volume: 88.7% Epithelialization: Small (1-33%) Tunneling: No Undermining: No Wound Description Classification: Full Thickness Without Exposed Support Structures Wound Margin: Flat and Intact Exudate Amount: Medium Exudate Type: Purulent Exudate Color: yellow, [name], green Foul Odor After Cleansing: No Slough/Fibrino Yes Wound Bed Granulation Amount: Small (1-33%) Exposed Structure Granulation Quality: Pink, Pale Fascia Exposed: No Necrotic Amount: Large (67-100%) Fat Layer (Subcutaneous Tissue) Exposed: Yes Necrotic Quality: Adherent Slough Tendon Exposed: No Muscle Exposed: No Joint Exposed: No Bone Exposed: No Treatment Notes Wound  #17 (Lower Leg) Wound Laterality: Right, Lateral Cleanser Soap and Water Discharge Instruction: May shower and wash wound with dial antibacterial soap and water prior to dressing change. Wound Cleanser Discharge Instruction: Cleanse the wound with wound cleanser prior to applying a clean dressing using gauze sponges, not tissue or cotton balls. Peri-Wound Care Triamcinolone 15 (g) Discharge Instruction: Use triamcinolone 15 (g) as directed Sween Lotion (Moisturizing lotion) Discharge Instruction: Apply moisturizing lotion as directed Topical Mupirocin Ointment Discharge Instruction: Apply Mupirocin (Bactroban) as instructed Primary Dressing PROGENA MATRIX Secondary Dressing ABD Pad, 5x9 Discharge Instruction: Apply over primary dressing as directed. Zetuvit Plus 4x8 in Discharge Instruction: Apply over primary dressing as directed. Secured With Compression Wrap Kerlix Roll 4.5x3.1 (in/yd) Discharge Instruction: Apply Kerlix and Coban compression as directed. Coban Self-Adherent Wrap 4x5 (in/yd) Discharge Instruction: Apply over Kerlix as directed. Compression Stockings Add-Ons Electronic Signature(s) Signed: 11/15/2021 6:30:20 PM By: Dellie Catholic RN Entered By: Dellie Catholic on 11/15/2021 10:59:11 -------------------------------------------------------------------------------- Wound Assessment Details Patient Name: Date of Service: [name], [place] [name] 11/15/2021 10:15 A M Medical Record Number: [id number] Patient Account Number: [id number] Date of Birth/Sex: Treating RN: 02-Nov-1970 (51 y.o. [name] Primary Care Fairy Ashlock: [name] Other Clinician: Referring Mynor Witkop: Treating Jaelie Aguilera/Extender: Elyse Jarvis Weeks in Treatment: 30 Wound Status Wound Number: 21 Primary Etiology: Sickle Cell Lesion Wound Location: Right, Medial Ankle Secondary Etiology: Venous Leg Ulcer Wounding Event: Gradually Appeared Wound Status: Open Date  Acquired: 06/26/2021 Comorbid History: Anemia, Sickle Cell Disease, Neuropathy Weeks Of Treatment: 20 Clustered Wound: No Photos Wound Measurements Length: (cm) 4.8 Width: (cm) 2.7 Depth: (cm) 0.1 Area: (cm) 10.179 Volume: (cm) 1.018 % Reduction in Area: 64.9% % Reduction in Volume: 64.9% Epithelialization: Small (  1-33%) Tunneling: No Undermining: No Wound Description Classification: Full Thickness Without Exposed Support Structures Wound Margin: Distinct, outline attached Exudate Amount: Medium Exudate Type: Purulent Exudate Color: yellow, [name], green Foul Odor After Cleansing: No Slough/Fibrino Yes Wound Bed Granulation Amount: Small (1-33%) Exposed Structure Granulation Quality: Pink Fascia Exposed: No Necrotic Amount: Large (67-100%) Fat Layer (Subcutaneous Tissue) Exposed: Yes Necrotic Quality: Adherent Slough Tendon Exposed: No Muscle Exposed: No Joint Exposed: No Bone Exposed: No Treatment Notes Wound #21 (Ankle) Wound Laterality: Right, Medial Cleanser Soap and Water Discharge Instruction: May shower and wash wound with dial antibacterial soap and water prior to dressing change. Wound Cleanser Discharge Instruction: Cleanse the wound with wound cleanser prior to applying a clean dressing using gauze sponges, not tissue or cotton balls. Peri-Wound Care Triamcinolone 15 (g) Discharge Instruction: Use triamcinolone 15 (g) as directed Sween Lotion (Moisturizing lotion) Discharge Instruction: Apply moisturizing lotion as directed Topical Mupirocin Ointment Discharge Instruction: Apply Mupirocin (Bactroban) as instructed Primary Dressing PROGENA MATRIX Secondary Dressing ABD Pad, 5x9 Discharge Instruction: Apply over primary dressing as directed. Zetuvit Plus 4x8 in Discharge Instruction: Apply over primary dressing as directed. Secured With Compression Wrap Kerlix Roll 4.5x3.1 (in/yd) Discharge Instruction: Apply Kerlix and Coban compression as  directed. Coban Self-Adherent Wrap 4x5 (in/yd) Discharge Instruction: Apply over Kerlix as directed. Compression Stockings Add-Ons Electronic Signature(s) Signed: 11/15/2021 6:30:20 PM By: Dellie Catholic RN Entered By: Dellie Catholic on 11/15/2021 10:59:33 -------------------------------------------------------------------------------- Vitals Details Patient Name: Date of Service: [name], [name] 11/15/2021 10:15 A M Medical Record Number: [id number] Patient Account Number: [id number] Date of Birth/Sex: Treating RN: 1971-05-13 (51 y.o. [name] Primary Care Mostafa Yuan: [name] Other Clinician: Referring Icy Fuhrmann: Treating Kenidi Elenbaas/Extender: Elyse Jarvis Weeks in Treatment: 30 Vital Signs Time Taken: 10:35 Temperature (F): 98.4 Height (in): 67 Pulse (bpm): 80 Respiratory Rate (breaths/min): 18 Blood Pressure (mmHg): 135/77 Reference Range: 80 - 120 mg / dl Electronic Signature(s) Signed: 11/15/2021 6:30:20 PM By: Dellie Catholic RN Entered By: Dellie Catholic on 11/15/2021 10:36:46

## 2021-11-16 NOTE — Progress Notes (Signed)
[name], [name] ([id number]) Visit Report for 11/15/2021 Chief Complaint Document Details Patient Name: Date of Service: [name] [name] 11/15/2021 10:15 A M Medical Record Number: [id number] Patient Account Number: [id number] Date of Birth/Sex: Treating RN: [birth year] (51 y.o. [name] Primary Care Provider: Cammie [name] Other Clinician: Referring Provider: Treating Provider/Extender: [name] in Treatment: 30 Information Obtained from: Patient Chief Complaint the patient is here for evaluation of her bilateral lower extremity [name] cell ulcers 04/17/2021; patient comes in for substantial wounds on the right and left lower leg Electronic Signature(s) Signed: 11/15/2021 11:18:47 AM By: [name] Maudlin MD FACS Entered By: [name] on 11/15/2021 11:18:47 -------------------------------------------------------------------------------- Cellular or Tissue Based Product Details Patient Name: Date of Service: [name] [name], [name] 11/15/2021 10:15 A M Medical Record Number: [id number] Patient Account Number: [id number] Date of Birth/Sex: Treating RN: [birth year] (51 y.o. [name] Primary Care Provider: Cammie [name] Other Clinician: Referring Provider: Treating Provider/Extender: [name] in Treatment: 30 Cellular or Tissue Based Product Type Wound #17 Right,Lateral Lower Leg Applied to: Performed By: Physician [name] Maudlin, MD Cellular or Tissue Based Product Type: Other Level of Consciousness (Pre-procedure): Awake and Alert Pre-procedure Verification/Time Out Yes - 11:02 Taken: Location: trunk / arms / legs Wound Size (sq cm): 27 Product Size (sq cm): 36 Waste Size (sq cm): 0 Amount of Product Applied (sq cm): 36 Instrument Used: Forceps, Scissors Lot #: [id number] Order #: 2 Expiration Date: 03/23/2024 Fenestrated: No Reconstituted: No Secured: Yes Secured With:  Steri-Strips Dressing Applied: Yes Primary Dressing: Adaptic;gauze Procedural Pain: 0 Post Procedural Pain: 0 Response to Treatment: Procedure was tolerated well Level of Consciousness (Post- Awake and Alert procedure): Post Procedure Diagnosis Same as Pre-procedure Notes **** TRIAL Progena Matrix** Do not charge Electronic Signature(s) Signed: 11/15/2021 6:30:20 PM By: [name] Catholic RN Signed: 11/16/2021 7:42:03 AM By: [name] Maudlin MD FACS Entered By: [name] on 11/15/2021 18:23:25 -------------------------------------------------------------------------------- Cellular or Tissue Based Product Details Patient Name: Date of Service: [name], [name] 11/15/2021 10:15 A M Medical Record Number: [id number] Patient Account Number: [id number] Date of Birth/Sex: Treating RN: [birth year] (51 y.o. [name] Primary Care Provider: Cammie [name] Other Clinician: Referring Provider: Treating Provider/Extender: [name] in Treatment: 30 Cellular or Tissue Based Product Type Wound #21 Right,Medial Ankle Applied to: Performed By: Physician [name] Maudlin, MD Cellular or Tissue Based Product Type: Other Level of Consciousness (Pre-procedure): Awake and Alert Pre-procedure Verification/Time Out Yes - 11:02 Taken: Location: trunk / arms / legs Wound Size (sq cm): 12.96 Product Size (sq cm): 16 Waste Size (sq cm): 0 Amount of Product Applied (sq cm): 16 Instrument Used: Forceps, Scissors Lot #: [id number] Order #: 2 Expiration Date: 03/23/2024 Fenestrated: No Reconstituted: No Secured: Yes Secured With: Steri-Strips Dressing Applied: Yes Primary Dressing: Adaptic;gauze Procedural Pain: 0 Post Procedural Pain: 0 Response to Treatment: Procedure was tolerated well Level of Consciousness (Post- Awake and Alert procedure): Post Procedure Diagnosis Same as Pre-procedure Notes *** TRIAL Progena Matrix***** Do not  charge Electronic Signature(s) Signed: 11/15/2021 6:30:20 PM By: [name] Catholic RN Signed: 11/16/2021 7:42:03 AM By: [name] Maudlin MD FACS Entered By: [name] on 11/15/2021 18:24:31 -------------------------------------------------------------------------------- Debridement Details Patient Name: Date of Service: [name], [name] 11/15/2021 10:15 A M Medical Record Number: [id number] Patient Account Number: [id number] Date of Birth/Sex: Treating RN: 1971/03/03 (50 y.o. [name] Primary Care Provider: Cammie [name] Other Clinician: Referring Provider: Treating Provider/Extender: Darrick Penna,  [place] [name] in Treatment: 30 Debridement Performed for Assessment: Wound #17 Right,Lateral Lower Leg Performed By: Physician [name] Maudlin, MD Debridement Type: Debridement Level of Consciousness (Pre-procedure): Awake and Alert Pre-procedure Verification/Time Out Yes - 11:02 Taken: Start Time: 11:02 Pain Control: Lidocaine 5% topical ointment T Area Debrided (L x W): otal 6 (cm) x 4.5 (cm) = 27 (cm) Tissue and other material debrided: Non-Viable, Slough, Slough Level: Non-Viable Tissue Debridement Description: Selective/Open Wound Instrument: Curette Bleeding: Minimum Hemostasis Achieved: Pressure End Time: 11:04 Procedural Pain: 0 Post Procedural Pain: 0 Response to Treatment: Procedure was tolerated well Level of Consciousness (Post- Awake and Alert procedure): Post Debridement Measurements of Total Wound Length: (cm) 6 Width: (cm) 4.5 Depth: (cm) 0.1 Volume: (cm) 2.121 Character of Wound/Ulcer Post Debridement: Improved Post Procedure Diagnosis Same as Pre-procedure Electronic Signature(s) Signed: 11/15/2021 11:42:33 AM By: [name] Maudlin MD FACS Signed: 11/15/2021 6:30:20 PM By: [name] Catholic RN Entered By: [name] on 11/15/2021  11:14:30 -------------------------------------------------------------------------------- Debridement Details Patient Name: Date of Service: [name], [name] 11/15/2021 10:15 A M Medical Record Number: [id number] Patient Account Number: [id number] Date of Birth/Sex: Treating RN: Nov 14, 1970 (51 y.o. [name] Primary Care Provider: Cammie [name] Other Clinician: Referring Provider: Treating Provider/Extender: [name] in Treatment: 30 Debridement Performed for Assessment: Wound #21 Right,Medial Ankle Performed By: Physician [name] Maudlin, MD Debridement Type: Debridement Severity of Tissue Pre Debridement: Fat layer exposed Level of Consciousness (Pre-procedure): Awake and Alert Pre-procedure Verification/Time Out Yes - 11:02 Taken: Start Time: 11:02 Pain Control: Lidocaine 5% topical ointment T Area Debrided (L x W): otal 4.8 (cm) x 2.7 (cm) = 12.96 (cm) Tissue and other material debrided: Non-Viable, Slough, Slough Level: Non-Viable Tissue Debridement Description: Selective/Open Wound Instrument: Curette Bleeding: Minimum Hemostasis Achieved: Pressure End Time: 11:04 Procedural Pain: 0 Post Procedural Pain: 0 Response to Treatment: Procedure was tolerated well Level of Consciousness (Post- Awake and Alert procedure): Post Debridement Measurements of Total Wound Length: (cm) 4.8 Width: (cm) 2.7 Depth: (cm) 0.1 Volume: (cm) 1.018 Character of Wound/Ulcer Post Debridement: Improved Severity of Tissue Post Debridement: Fat layer exposed Post Procedure Diagnosis Same as Pre-procedure Electronic Signature(s) Signed: 11/15/2021 11:42:33 AM By: [name] Maudlin MD FACS Signed: 11/15/2021 6:30:20 PM By: [name] Catholic RN Entered By: [name] on 11/15/2021 11:15:49 -------------------------------------------------------------------------------- HPI Details Patient Name: Date of Service: [name] [name], [name] 11/15/2021 10:15  A M Medical Record Number: [id number] Patient Account Number: [id number] Date of Birth/Sex: Treating RN: January 16, 1971 (51 y.o. [name] Primary Care Provider: Cammie [name] Other Clinician: Referring Provider: Treating Provider/Extender: [name] in Treatment: 30 History of Present Illness Location: medial and lateral ankle region on the right and left medial malleolus Quality: Patient reports experiencing a shooting pain to affected area(s). Severity: Patient states wound(s) are getting worse. Duration: right lower extremity bimalleolar ulcers have been present for approximately 2 years; the rright meedial malleolus ulcer has been there proximally 6 months Timing: Pain in wound is constant (hurts all the time) Context: The wound would happen gradually ssociated Signs and Symptoms: Patient reports having increase discharge. 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 Leland Johns for a surgical debridement as I was unable to do anything in the office due to excruciating pain. At that stage she was referred from the plastic surgery service to dermatology who treated  her for a skin infection with doxycycline and then Levaquin and a local antibiotic ointment. I understand the patient has since developed ulceration on the left ankle both medial and lateral and was now referred back to the wound center as dermatology has finished the management. 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 She has been visiting the wound center [facility] and was seen by Dr. Dellia Nims and Dr. Leland Johns. after prolonged  conservator therapy between July 2016 and January 2017. She had been seen by the plastic surgeon and taken to the OR for debridement and application of Theraskin. She had 3 applications of Theraskin and was then treated with collagen. Prior to that she had a history of similar problems in 2014 and was treated conservatively. Had a reflux study done for the right lower extremity in August 2016 without reflux or DVT . Past medical history significant for [name] cell disease, anemia, leg ulcers, cholelithiasis,and has never been a smoker. Once the patient was discharged on the wound center she says within 2 or 3 [name] the problems recurred and she has been treating it conservatively. since I saw her 3 [name] ago at [facility] she has been unable to get her dressing material but has completed a course of doxycycline. 6/7/ 2017 -- lower extremity venous duplex reflux evaluation was done No evidence of SVT or DVT in the RLL. No venous incompetence in the RLL. No further vascular workup is indicated at this time. She was seen by Dr. [name], on 10/04/2015. She agreed with the plan of taking her to the OR for debridement and application of theraskin and would also take biopsies to rule out pyoderma gangrenosum. Follow-up note dated May 31 received and she was status post application of Theraskin to multiple ulcers around the right ankle. Pathology did not show evidence of malignancy or pyoderma gangrenosum. She would continue to see as in the wound clinic for further care and see Dr. Leland Johns as needed. The patient brought the biopsy report and it was consistent with stasis ulcer no evidence of malignancy and the comment was that there was some adjacent neovascularization, fibrosis and patchy perivascular chronic inflammation. 11/15/2015 -- today we applied her first application of Theraskin 11/30/15; TheraSkin #2 12/13/2015 -- she is having a lot of pain locally and is here for possible application  of a theraskin today. 01/16/2016 -- the patient has significant pain and has noticed despite in spite of all local care and oral pain medication. It is impossible to debride her in the office. 02/06/2016 -- I do not see any notes from Dr. Iran Planas( the patient has not made a call to the office know as she heard from them) and the only visit to recently was with her PCP Dr. Danella Penton -- I saw her on 01/16/2016 and prescribed 90 tablets of oxycodone 10 mg and did lab work and screening for HIV. the HIV was negative and hemoglobin was 6.3 with a WBC count of 14.9 and hematocrit of 17.8 with platelets of 561. reticulocyte count was 15.5% READMISSION: 07/10/2016- The patient is here for readmission for bilateral lower extremity ulcers in the presence of [name] cell. The bimalleolar ulcers to the right lower extremity have been present for approximately 2 years, the left medial malleolus ulcer has been present approximately 6  months. She has followed with Dr.Thimmappa in the past and has had a total of 3 applications of Theraskin (01/2015, 09/2015, 06/17/16). She has also followed with Dr. Con Memos here in the clinic and has received 2 applications of TheraSkin (11/10/15, 11/30/15). The patient does experience chronic, and is not amenable to debridement. She had a [name] cell crisis in December 2017, prior to that has been several years. She is not currently on any antibiotic therapy and has not been treated with any recently. 07/17/2016 -- was seen by Dr. Iran Planas of plastic surgery who saw her 2 [name] postop application of Theraskin #3. She had removed her dressing and asked her to apply silver alginate on alternate days and follow-up back with the wound center. Future debridements and application of skin substitute would have to be done in the hospital due to her high risk for anesthesia. READMISSION 04/17/2021 Patient is now a 51 year old woman that we have had in this clinic for a prolonged period of  time and 2016-2017 and then again for 2 visits in February 2018. At that point she had wounds on the right lower leg predominantly medial. She had also been seen by plastic surgery Dr. Leland Johns who I believe took her to the OR for operative debridement and application of TheraSkin in 2017. After she left our clinic she was followed for a very prolonged period of time in the wound care center in [facility] who then referred her ultimately to [facility] where she was seen by Dr. Vernona Rieger. Again taken her to the OR for skin grafting which apparently did not take. She had multiple other attempts at dressings although I have not really looked over all of these notes in great detail. She has not been seen in a wound care center in about a year. She states over the last year in addition to her right lower leg she has developed wounds on the left lower leg quite extensive. She is using Xeroform to all of these wounds without really any improvement. She also has Medicaid which does not cover wound products. The patient has had vascular work-ups in the past including most recently on 03/28/2021 showing biphasic waveforms on the right triphasic at the PTA and biphasic at the dorsalis pedis on the left. She was unable to tolerate any degree of compression to do ABIs. Unfortunately TBI's were also not done. She had venous reflux studies done in 2017. This did not show any evidence of a DVT or SVT and no venous incompetence was noted in the right leg at the time this was the only side with the wound As noted I did not look all over her old records. She apparently had a course of HBO and [facility] although I am not sure what the indication would have been. In any case she developed seizures and terminated treatment earlier. She is generally much more disabled than when we last saw her in clinic. She can no longer walk pretty much wheelchair-bound because predominantly of pain in the left hip. 04/24/2021; the patient tolerated  the wraps we put on. We used Santyl and Hydrofera Blue under compression. I brought her back for a nurse visit for a change in dressing. With Medicaid we will have a hard time getting anything paid for and hence the need for compression. She arrives in clinic with all the wounds looking somewhat better in terms of surface 12/20; circumferential wound on the right from the lateral to the medial. She has open areas on the left  medial and left lateral x2 on all of this with the same surface. This does not look completely healthy although she does have some epithelialization. She is not complaining of a lot of pain which is unusual for her [name] ulcers. I have not looked over her extensive records from [facility]. She had recent arterial studies and has a history of venous reflux studies I will need to look these over although I do not believe she has significant arterial disease 2023 05/22/2021; patient's wound areas measure slightly smaller. Still a lot of drainage coming from the right we have been using Hydrofera Blue and Santyl with some improvement in the wound surfaces. 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 This was in November and showed biphasic and triphasic waveforms but she could not tolerate ABIs because of pressure and unfortunately TBI's were not done. She has not had recent venous reflux studies that I can see 1/10; not much change about the same surface area. This has a yellowish surface to it very gritty. We have been using Santyl and Hydrofera Blue for a prolonged period. Culture I did last week showed methicillin sensitive staph aureus "rare". Our intake nurse reports greenish drainage which may be the Hydrofera Blue itself 1/17; wounds are continue to measure smaller although I am not sure about the accuracy here. Especially the areas on the right are  covered in what looks to be a nonviable surface although she does have some epithelialization. Similarly she has areas on the left medial and left lateral ankle area which appear to have a better surface and perhaps are slightly smaller. We have been using Santyl and Hydrofera Blue. She cannot tolerate mechanical debridements She went for her reflux studies which showed significant reflux at the greater saphenous vein at the saphenofemoral junction as well as the greater saphenous vein in the proximal calf on the left she had reflux in the thigh and the common femoral vein and supra vein Fishel vein reflux in the greater saphenous vein. I will have vein and vascular look at this. My thoughts have been that these are likely [name] wounds. I looked through her old records from [facility] wound care center and then when she graduated to [facility] wound care center where she saw Dr. Zigmund Daniel and Dr. Vernona Rieger. Although I can see she had reflux studies done I do not see that she actually saw a vein and vascular. I went over the fact that she had operative debridements and actual skin grafting that did not take. I do not think these wounds have ever really progressed towards healing 1/31;Substantial wounds on the right ankle area. Hyper granulated very gritty adherent debris on the surface. 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 On the left she has no DVT or SVT deep vein reflux in the common femoral vein. Superficial vein reflux in the greater saphenous vein on although the vein diameter is not really all that large. I do not think there is  anything that can be done with these although I am going to send her for consultation to vein and vascular. 2/7; Wound exam; substantial wound area on the right posterior ankle area and areas on the left medial ankle and left lateral ankle. I was able to debride the left medial ankle last week fairly aggressively and it is back this week to a completely nonviable surface She will see vascular surgery this Friday and I would like them to review the venous studies and also any comments on her arterial status. If they do not see an issue here I am going to refer her to plastic surgery for an operative debridement perhaps intraoperative ACell or Integra. Eventually she will require a deep tissue culture again 2/14; substantial wound area on the right posterior ankle, medial ankle. We have been using silver alginate The patient was seen by vein and vascular she had both venous reflux studies and arterial studies. 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 She had no evidence of superficial venous thrombosis. She did have superficial vein reflux at the saphenofemoral junction and the greater saphenous vein. On the left no evidence of a DVT no evidence of superficial venous throat thrombosis she did have deep vein reflux in the common femoral vein and superficial vein reflux in the greater saphenous vein but these were not felt to be amenable to ablation. In terms of arterial studies she had triphasic and wife biphasic wave waveforms bilaterally not felt to have a significant arterial issue. I do not get the feeling that they felt that any part of her nonhealing wounds were related to either arterial or venous issues. They did note that she had venous reflux at the right at the [facility] and [facility]. And also on the left there were reflux in the deep system at the common femoral vein and greater saphenous vein in the proximal thigh. Nothing  amenable to ablation. 2/20; she is making some decent progress on the right where there is nice skin between the 2 open areas on the right ankle. The surfaces here do not look viable yet there is some surrounding epithelialization. She still has a small area on the left medial ankle area. Hyper-granulated Jody's away always 2/28 patient has an appointment with plastic surgery on 3/8. We will see her back on 3/9. She may have to call us to get the area redressed. We've been using Santyl under silver alginate. We made a nice improvement on the left medial ankle. The larger wounds on the right also looks somewhat better in terms of epithelialization although I think they could benefit from an aggressive debridement if plastic surgery would be willing to do that. Perhaps placement of Integra or a cell 07/26/2021: She saw Dr. Claudia Desanctis yesterday. He raised the question as to whether or not this might be pyoderma and wanted to wait until that question was answered by dermatology before proceeding with any sort of operative debridement. We have continue to use Santyl under silver alginate with Kerlix and Coban wraps. Overall, her wounds appear to be continuing to contract and epithelialize, with some granulation tissue present. There continues to be some slough on all wound surfaces. 08/09/2021: She has not been able to get an appointment with dermatology because apparently the offices in [place] do not accept Medicaid. She is looking into whether or not she can be seen at the main [facility]  [facility] dermatology clinic. This is necessary because plastic surgery is concerned that her wounds might represent pyoderma and they did not want to do any procedure until that was clarified. We have been using Santyl under silver alginate with Kerlix and Coban wraps. Today, there was a greater amount of drainage on her dressings with a slight green discoloration and significant odor. Despite this, her wounds  continue to contract and epithelialize. There is pale granulation tissue present and actually, on the left medial ankle, the granulation tissue is a bit hypertrophic. 08/16/2021: Last week, I took a culture and this grew back rare methicillin-resistant Staph aureus and rare corynebacterium. The MRSA was sensitive to gentamicin which we began applying topically on an empiric basis. This week, her wounds are a bit smaller and the drainage and odor are less. Her primary care provider is working on assisting the patient with a dermatology evaluation. She has been in silver alginate over the gentamicin that was started last week along with Kerlix and Coban wraps. 08/23/2021: Because she has Medicaid, we have been unable to get her into see any dermatologist in the [place] to rule out pyoderma gangrenosum, which was a requirement from plastic surgery prior to any sort of debridement and grafting. Despite this, however, all of her wounds continue to get smaller. The wound on her left medial ankle is nearly closed. There is no odor from the wounds, although she still accumulates a modest amount of drainage on her dressings. 08/30/2021: The lateral right ankle wound and the medial left ankle wound are a bit smaller today. The medial right ankle wound is about the same size. They are less tender. We have still been unable to get her into dermatology. 09/06/2021: All of the wounds are about the same size today. She continues to endorse minimal pain. I communicated with Dr. Claudia Desanctis in plastic surgery regarding our issues getting a dermatology appointment; he was out of town but indicated that he would look into perhaps performing the biopsy in his office and will have his office contact her. 09/14/2021: The patient has an appointment in dermatology, but it is not until October. Her wounds are roughly the same; she continues to have very thick purulent-looking drainage on her dressings. 09/20/2021: The left medial wound is  nearly closed and just has a bit of accumulated eschar on the surface. The right medial and lateral ankle wounds are perhaps a little bit smaller. They continue to have a very pale surface with accumulation of thin slough. 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 I did not think [employer] was indicated based on this. She is getting topical mupirocin with Prisma silver collagen. 10/04/2021: The patient was not seen in clinic last week due to childcare coverage issues. In the interim, the left medial leg wound has closed. The right sided leg wounds are smaller. There is more granulation tissue coming through, particularly on the lateral wound. The surface remains somewhat gritty. We have been applying topical mupirocin and Prisma silver collagen. 10/11/2021: The left medial leg wound remains closed. She does complain of some anesthetic sensation to the area. Both of the right-sided leg wounds are smaller but still have accumulated slough. 10/18/2021: Both right-sided leg wounds are minimally smaller this week. She still continues to accumulate slough and has thick drainage on her dressings. 10/23/2021: Both wounds continue to contract. There is still slough buildup. She has been approved for a keratin-based skin substitute trial product but it will not  be available until next week. 10/30/2021: The wounds are about the same to perhaps slightly smaller. There is still continued slough buildup. Unfortunately, the rep for the keratin based product did not show up today and did not answer his phone when called. 11/08/2021: The wounds are little bit smaller today. She continues to have thick drainage but the surfaces are relatively clean with just a little bit of slough accumulation. She reported to me today that she is unable to completely flex her left ankle and on examination it seems this is potentially related to scar tissue from her wounds. We do have the ProgenaMatrix trial product  available for her today. 11/15/2021: Both wounds are smaller today. There is some slough accumulation on the surfaces, but the medial wound, in particular looks like it is filling in and is less deep. She did hear from physical therapy and she is going to start working with them on July 11. She is here for her second application of the trial skin substitute, ProgenaMatrix. Electronic Signature(s) Signed: 11/15/2021 11:19:46 AM By: [name] Maudlin MD FACS Entered By: [name] on 11/15/2021 11:19:45 -------------------------------------------------------------------------------- Physical Exam Details Patient Name: Date of Service: [name] [name], [name] 11/15/2021 10:15 A M Medical Record Number: [id number] Patient Account Number: [id number] Date of Birth/Sex: Treating RN: 1970-10-27 (51 y.o. [name] Primary Care Provider: Cammie [name] Other Clinician: Referring Provider: Treating Provider/Extender: [name] in Treatment: 30 Constitutional . . . . No acute distress.Marland Kitchen Respiratory Normal work of breathing on room air.. Notes 11/15/2021: Both wounds are smaller today. There is some slough accumulation on the surfaces, but the medial wound, in particular looks like it is filling in and is less deep. Electronic Signature(s) Signed: 11/15/2021 11:20:23 AM By: [name] Maudlin MD FACS Entered By: [name] on 11/15/2021 11:20:23 -------------------------------------------------------------------------------- Physician Orders Details Patient Name: Date of Service: [name] [name], [name] 11/15/2021 10:15 A M Medical Record Number: [id number] Patient Account Number: [id number] Date of Birth/Sex: Treating RN: 07-16-1970 (51 y.o. 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 - Dr. Celine Ahr - Room 3 Thursday 7/6 at 10:15 am,7/13 at 10:15 am Return A Other: - PROGENA MATRIX #1 Cellular or Tissue Based Products Other Cellular or Tissue Based Products Orders/Instructions: - PROGENA MATRIX#2 Bathing/ Shower/ Hygiene May shower with protection but do not get wound dressing(s) wet. - Can get cast protector bags at [facility] or [facility] Edema Control - Lymphedema / SCD / Other Elevate legs to the level of the heart or above for 30 minutes daily and/or when sitting, a frequency of: - throughout the day Avoid standing for long periods of time. Exercise regularly Additional Orders / Instructions Follow Nutritious Diet Wound Treatment Wound #17 - Lower Leg Wound Laterality: Right, Lateral Cleanser: Soap and Water 1 x Per Week/30 Days Discharge Instructions: May shower and wash wound with dial antibacterial soap and water prior to dressing change. Cleanser: Wound Cleanser 1 x Per Week/30 Days Discharge Instructions: Cleanse the wound with wound cleanser prior to applying a clean dressing using gauze sponges, not tissue or cotton balls. Peri-Wound Care: Triamcinolone 15 (g) 1 x Per Week/30 Days Discharge Instructions: Use triamcinolone 15 (g) as directed  Peri-Wound Care: Sween Lotion (Moisturizing lotion) 1 x Per Week/30 Days Discharge Instructions: Apply moisturizing lotion as directed Topical: Mupirocin Ointment 1 x Per Week/30 Days Discharge Instructions: Apply Mupirocin (Bactroban) as instructed Prim Dressing: [place] ary 1 x Per Week/30 Days Secondary Dressing: ABD Pad, 5x9 1 x Per Week/30 Days Discharge Instructions: Apply over primary dressing as directed. Secondary Dressing: Zetuvit Plus 4x8 in 1 x Per Week/30 Days Discharge Instructions:  Apply over primary dressing as directed. Compression Wrap: Kerlix Roll 4.5x3.1 (in/yd) 1 x Per Week/30 Days Discharge Instructions: Apply Kerlix and Coban compression as directed. Compression Wrap: Coban Self-Adherent Wrap 4x5 (in/yd) 1 x Per Week/30 Days Discharge Instructions: Apply over Kerlix as directed. Wound #21 - Ankle Wound Laterality: Right, Medial Cleanser: Soap and Water 1 x Per Week/30 Days Discharge Instructions: May shower and wash wound with dial antibacterial soap and water prior to dressing change. Cleanser: Wound Cleanser 1 x Per Week/30 Days Discharge Instructions: Cleanse the wound with wound cleanser prior to applying a clean dressing using gauze sponges, not tissue or cotton balls. Peri-Wound Care: Triamcinolone 15 (g) 1 x Per Week/30 Days Discharge Instructions: Use triamcinolone 15 (g) as directed Peri-Wound Care: Sween Lotion (Moisturizing lotion) 1 x Per Week/30 Days Discharge Instructions: Apply moisturizing lotion as directed Topical: Mupirocin Ointment 1 x Per Week/30 Days Discharge Instructions: Apply Mupirocin (Bactroban) as instructed Prim Dressing: [place] ary 1 x Per Week/30 Days Secondary Dressing: ABD Pad, 5x9 1 x Per Week/30 Days Discharge Instructions: Apply over primary dressing as directed. Secondary Dressing: Zetuvit Plus 4x8 in 1 x Per Week/30 Days Discharge Instructions: Apply over primary dressing as directed. Compression Wrap: Kerlix Roll 4.5x3.1 (in/yd) 1 x Per Week/30 Days Discharge Instructions: Apply Kerlix and Coban compression as directed. Compression Wrap: Coban Self-Adherent Wrap 4x5 (in/yd) 1 x Per Week/30 Days Discharge Instructions: Apply over Kerlix as directed. Electronic Signature(s) Signed: 11/15/2021 11:42:33 AM By: [name] Maudlin MD FACS Entered By: [name] on 11/15/2021 11:20:34 -------------------------------------------------------------------------------- Problem List Details Patient Name: Date of  Service: [name] [name], [name] 11/15/2021 10:15 A M Medical Record Number: [id number] Patient Account Number: [id number] Date of Birth/Sex: Treating RN: 14-Jan-1971 (51 y.o. 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 04/17/2021 No Yes Inactive Problems ICD-10 Code Description Active Date Inactive Date L97.828 Non-pressure chronic ulcer of other part of left lower leg with other specified severity 04/17/2021 04/17/2021 Resolved Problems Electronic Signature(s) Signed: 11/15/2021 11:18:34 AM By: [name] Maudlin MD FACS Entered By: [name] on 11/15/2021 11:18:34 -------------------------------------------------------------------------------- Progress Note Details Patient Name: Date of Service: [name] [name], [name] 11/15/2021 10:15 A M Medical Record Number: [id number] Patient Account Number: [id number] Date of Birth/Sex: Treating RN: March 04, 1971 (51 y.o. [name] Primary Care Provider: Cammie [name] Other Clinician: Referring Provider: Treating Provider/Extender: [name] in Treatment: 30 Subjective Chief Complaint Information obtained from Patient the patient is here for evaluation of her bilateral lower extremity [name] cell ulcers 04/17/2021; patient comes in for substantial wounds on the right and left lower leg History of Present Illness (HPI) The following HPI elements were documented for the patient's wound: Location: medial and lateral ankle region on the right and left medial malleolus Quality: Patient reports  experiencing a shooting pain to affected  area(s). Severity: Patient states wound(s) are getting worse. Duration: right lower extremity bimalleolar ulcers have been present for approximately 2 years; the rright meedial malleolus ulcer has been there proximally 6 months Timing: Pain in wound is constant (hurts all the time) Context: The wound would happen gradually Associated Signs and Symptoms: Patient reports having increase discharge. 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 Leland Johns for a surgical debridement as I was unable to do anything in the office due to excruciating pain. At that stage she was referred from the plastic surgery service to dermatology who treated her for a skin infection with doxycycline and then Levaquin and a local antibiotic ointment. I understand the patient has since developed ulceration on the left ankle both medial and lateral and was now referred back to the wound center as dermatology has finished the management. 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 She has been visiting the wound center [facility] and was seen by Dr. Dellia Nims and Dr. Leland Johns. after prolonged conservator therapy between July 2016 and January 2017. She had been seen by the plastic surgeon and taken to the OR for debridement and application of Theraskin. She had 3 applications of Theraskin and was then treated with collagen. Prior to that she had a history of similar problems in 2014 and was treated conservatively. Had a reflux study done for the right lower extremity in August 2016 without reflux or DVT . Past medical history significant for [name] cell disease, anemia, leg ulcers,  cholelithiasis,and has never been a smoker. Once the patient was discharged on the wound center she says within 2 or 3 [name] the problems recurred and she has been treating it conservatively. since I saw her 3 [name] ago at [facility] she has been unable to get her dressing material but has completed a course of doxycycline. 6/7/ 2017 -- lower extremity venous duplex reflux evaluation was done oo No evidence of SVT or DVT in the RLL. No venous incompetence in the RLL. No further vascular workup is indicated at this time. She was seen by Dr. [name], on 10/04/2015. She agreed with the plan of taking her to the OR for debridement and application of theraskin and would also take biopsies to rule out pyoderma gangrenosum. Follow-up note dated May 31 received and she was status post application of Theraskin to multiple ulcers around the right ankle. Pathology did not show evidence of malignancy or pyoderma gangrenosum. She would continue to see as in the wound clinic for further care and see Dr. Leland Johns as needed. The patient brought the biopsy report and it was consistent with stasis ulcer no evidence of malignancy and the comment was that there was some adjacent neovascularization, fibrosis and patchy perivascular chronic inflammation. 11/15/2015 -- today we applied her first application of Theraskin 11/30/15; TheraSkin #2 12/13/2015 -- she is having a lot of pain locally and is here for possible application of a theraskin today. 01/16/2016 -- the patient has significant pain and has noticed despite in spite of all local care and oral pain medication. It is impossible to debride her in the office. 02/06/2016 -- I do  not see any notes from Dr. Iran Planas( the patient has not made a call to the office know as she heard from them) and the only visit to recently was with her PCP Dr. Danella Penton -- I saw her on 01/16/2016 and prescribed 90 tablets of oxycodone 10 mg and did lab work and screening  for HIV. the HIV was negative and hemoglobin was 6.3 with a WBC count of 14.9 and hematocrit of 17.8 with platelets of 561. reticulocyte count was 15.5% READMISSION: 07/10/2016- The patient is here for readmission for bilateral lower extremity ulcers in the presence of [name] cell. The bimalleolar ulcers to the right lower extremity have been present for approximately 2 years, the left medial malleolus ulcer has been present approximately 6 months. She has followed with Dr.Thimmappa in the past and has had a total of 3 applications of Theraskin (01/2015, 09/2015, 06/17/16). She has also followed with Dr. Con Memos here in the clinic and has received 2 applications of TheraSkin (11/10/15, 11/30/15). The patient does experience chronic, and is not amenable to debridement. She had a [name] cell crisis in December 2017, prior to that has been several years. She is not currently on any antibiotic therapy and has not been treated with any recently. 07/17/2016 -- was seen by Dr. Iran Planas of plastic surgery who saw her 2 [name] postop application of Theraskin #3. She had removed her dressing and asked her to apply silver alginate on alternate days and follow-up back with the wound center. Future debridements and application of skin substitute would have to be done in the hospital due to her high risk for anesthesia. READMISSION 04/17/2021 Patient is now a 51 year old woman that we have had in this clinic for a prolonged period of time and 2016-2017 and then again for 2 visits in February 2018. At that point she had wounds on the right lower leg predominantly medial. She had also been seen by plastic surgery Dr. Leland Johns who I believe took her to the OR for operative debridement and application of TheraSkin in 2017. After she left our clinic she was followed for a very prolonged period of time in the wound care center in [facility] who then referred her ultimately to [facility] where she was seen by Dr. Vernona Rieger. Again  taken her to the OR for skin grafting which apparently did not take. She had multiple other attempts at dressings although I have not really looked over all of these notes in great detail. She has not been seen in a wound care center in about a year. She states over the last year in addition to her right lower leg she has developed wounds on the left lower leg quite extensive. She is using Xeroform to all of these wounds without really any improvement. She also has Medicaid which does not cover wound products. The patient has had vascular work-ups in the past including most recently on 03/28/2021 showing biphasic waveforms on the right triphasic at the PTA and biphasic at the dorsalis pedis on the left. She was unable to tolerate any degree of compression to do ABIs. Unfortunately TBI's were also not done. She had venous reflux studies done in 2017. This did not show any evidence of a DVT or SVT and no venous incompetence was noted in the right leg at the time this was the only side with the wound As noted I did not look all over her old records. She apparently had a course of HBO and [facility] although I am not sure  what the indication would have been. In any case she developed seizures and terminated treatment earlier. She is generally much more disabled than when we last saw her in clinic. She can no longer walk pretty much wheelchair-bound because predominantly of pain in the left hip. 04/24/2021; the patient tolerated the wraps we put on. We used Santyl and Hydrofera Blue under compression. I brought her back for a nurse visit for a change in dressing. With Medicaid we will have a hard time getting anything paid for and hence the need for compression. She arrives in clinic with all the wounds looking somewhat better in terms of surface 12/20; circumferential wound on the right from the lateral to the medial. She has open areas on the left medial and left lateral x2 on all of this with the  same surface. This does not look completely healthy although she does have some epithelialization. She is not complaining of a lot of pain which is unusual for her [name] ulcers. I have not looked over her extensive records from [facility]. She had recent arterial studies and has a history of venous reflux studies I will need to look these over although I do not believe she has significant arterial disease 2023 05/22/2021; patient's wound areas measure slightly smaller. Still a lot of drainage coming from the right we have been using Hydrofera Blue and Santyl with some improvement in the wound surfaces. 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 This was in November and showed biphasic and triphasic waveforms but she could not tolerate ABIs because of pressure and unfortunately TBI's were not done. She has not had recent venous reflux studies that I can see 1/10; not much change about the same surface area. This has a yellowish surface to it very gritty. We have been using Santyl and Hydrofera Blue for a prolonged period. Culture I did last week showed methicillin sensitive staph aureus "rare". Our intake nurse reports greenish drainage which may be the Hydrofera Blue itself 1/17; wounds are continue to measure smaller although I am not sure about the accuracy here. Especially the areas on the right are covered in what looks to be a nonviable surface although she does have some epithelialization. Similarly she has areas on the left medial and left lateral ankle area which appear to have a better surface and perhaps are slightly smaller. We have been using Santyl and Hydrofera Blue. She cannot tolerate mechanical debridements She went for her reflux studies which showed significant reflux at the greater saphenous vein at the saphenofemoral junction as well as the greater  saphenous vein in the proximal calf on the left she had reflux in the thigh and the common femoral vein and supra vein Fishel vein reflux in the greater saphenous vein. I will have vein and vascular look at this. My thoughts have been that these are likely [name] wounds. I looked through her old records from [facility] wound care center and then when she graduated to [facility] wound care center where she saw Dr. Zigmund Daniel and Dr. Vernona Rieger. Although I can see she had reflux studies done I do not see that she actually saw a vein and vascular. I went over the fact that she had operative debridements and actual skin grafting that did not take. I do not think these wounds have ever really progressed towards healing 1/31;Substantial  wounds on the right ankle area. Hyper granulated very gritty adherent debris on the surface. 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 On the left she has no DVT or SVT deep vein reflux in the common femoral vein. Superficial vein reflux in the greater saphenous vein on although the vein diameter is not really all that large. I do not think there is anything that can be done with these although I am going to send her for consultation to vein and vascular. 2/7; Wound exam; substantial wound area on the right posterior ankle area and areas on the left medial ankle and left lateral ankle. I was able to debride the left medial ankle last week fairly aggressively and it is back this week to a completely nonviable surface She will see vascular surgery this Friday and I would like them to review the venous studies and also any  comments on her arterial status. If they do not see an issue here I am going to refer her to plastic surgery for an operative debridement perhaps intraoperative ACell or Integra. Eventually she will require a deep tissue culture again 2/14; substantial wound area on the right posterior ankle, medial ankle. We have been using silver alginate The patient was seen by vein and vascular she had both venous reflux studies and arterial studies. 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 She had no evidence of superficial venous thrombosis. She did have superficial vein reflux at the saphenofemoral junction and the greater saphenous vein. On the left no evidence of a DVT no evidence of superficial venous throat thrombosis she did have deep vein reflux in the common femoral vein and superficial vein reflux in the greater saphenous vein but these were not felt to be amenable to ablation. In terms of arterial studies she had triphasic and wife biphasic wave waveforms bilaterally not felt to have a significant arterial issue. I do not get the feeling that they felt that any part of her nonhealing wounds were related to either arterial or venous issues. They did note that she had venous reflux at the right at the [facility] [facility] And also on the left there were reflux in the deep system at the common femoral vein and greater saphenous vein in the proximal thigh. Nothing amenable to ablation. 2/20; she is making some decent progress on the right where there is nice skin between the 2 open areas on the right ankle. The surfaces here do not look viable yet there is some surrounding epithelialization. She still has a small area on the left medial ankle area. Hyper-granulated Jody's away always 2/28 patient has an appointment with plastic surgery on 3/8. We will see her back on 3/9. She may have to call us to get the area redressed. We've been  using Santyl under silver alginate. We made a nice improvement on the left medial ankle. The larger wounds on the right also looks somewhat better in terms of epithelialization although I think they could benefit from an aggressive debridement if plastic surgery would be  willing to do that. Perhaps placement of Integra or a cell 07/26/2021: She saw Dr. Claudia Desanctis yesterday. He raised the question as to whether or not this might be pyoderma and wanted to wait until that question was answered by dermatology before proceeding with any sort of operative debridement. We have continue to use Santyl under silver alginate with Kerlix and Coban wraps. Overall, her wounds appear to be continuing to contract and epithelialize, with some granulation tissue present. There continues to be some slough on all wound surfaces. 08/09/2021: She has not been able to get an appointment with dermatology because apparently the offices in [place] do not accept Medicaid. She is looking into whether or not she can be seen at the main [facility] dermatology clinic. This is necessary because plastic surgery is concerned that her wounds might represent pyoderma and they did not want to do any procedure until that was clarified. We have been using Santyl under silver alginate with Kerlix and Coban wraps. Today, there was a greater amount of drainage on her dressings with a slight green discoloration and significant odor. Despite this, her wounds continue to contract and epithelialize. There is pale granulation tissue present and actually, on the left medial ankle, the granulation tissue is a bit hypertrophic. 08/16/2021: Last week, I took a culture and this grew back rare methicillin-resistant Staph aureus and rare corynebacterium. The MRSA was sensitive to gentamicin which we began applying topically on an empiric basis. This week, her wounds are a bit smaller and the drainage and odor are less. Her primary  care provider is working on assisting the patient with a dermatology evaluation. She has been in silver alginate over the gentamicin that was started last week along with Kerlix and Coban wraps. 08/23/2021: Because she has Medicaid, we have been unable to get her into see any dermatologist in the [place] to rule out pyoderma gangrenosum, which was a requirement from plastic surgery prior to any sort of debridement and grafting. Despite this, however, all of her wounds continue to get smaller. The wound on her left medial ankle is nearly closed. There is no odor from the wounds, although she still accumulates a modest amount of drainage on her dressings. 08/30/2021: The lateral right ankle wound and the medial left ankle wound are a bit smaller today. The medial right ankle wound is about the same size. They are less tender. We have still been unable to get her into dermatology. 09/06/2021: All of the wounds are about the same size today. She continues to endorse minimal pain. I communicated with Dr. Claudia Desanctis in plastic surgery regarding our issues getting a dermatology appointment; he was out of town but indicated that he would look into perhaps performing the biopsy in his office and will have his office contact her. 09/14/2021: The patient has an appointment in dermatology, but it is not until October. Her wounds are roughly the same; she continues to have very thick purulent-looking drainage on her dressings. 09/20/2021: The left medial wound is nearly closed and just has a bit of accumulated eschar on the surface. The right medial and lateral ankle wounds are perhaps a little bit smaller. They continue to have a very pale surface with accumulation of thin slough. 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 I did not think [employer] was indicated based on this. She is getting topical mupirocin with Prisma silver collagen. 10/04/2021: The patient was not seen in clinic last week due  to  childcare coverage issues. In the interim, the left medial leg wound has closed. The right sided leg wounds are smaller. There is more granulation tissue coming through, particularly on the lateral wound. The surface remains somewhat gritty. We have been applying topical mupirocin and Prisma silver collagen. 10/11/2021: The left medial leg wound remains closed. She does complain of some anesthetic sensation to the area. Both of the right-sided leg wounds are smaller but still have accumulated slough. 10/18/2021: Both right-sided leg wounds are minimally smaller this week. She still continues to accumulate slough and has thick drainage on her dressings. 10/23/2021: Both wounds continue to contract. There is still slough buildup. She has been approved for a keratin-based skin substitute trial product but it will not be available until next week. 10/30/2021: The wounds are about the same to perhaps slightly smaller. There is still continued slough buildup. Unfortunately, the rep for the keratin based product did not show up today and did not answer his phone when called. 11/08/2021: The wounds are little bit smaller today. She continues to have thick drainage but the surfaces are relatively clean with just a little bit of slough accumulation. She reported to me today that she is unable to completely flex her left ankle and on examination it seems this is potentially related to scar tissue from her wounds. We do have the ProgenaMatrix trial product available for her today. 11/15/2021: Both wounds are smaller today. There is some slough accumulation on the surfaces, but the medial wound, in particular looks like it is filling in and is less deep. She did hear from physical therapy and she is going to start working with them on July 11. She is here for her second application of the trial skin substitute, ProgenaMatrix. Patient History Information obtained from Patient. Family History Diabetes - Mother, Lung  Disease - Mother, No family history of Cancer, Heart Disease, Hereditary Spherocytosis, Hypertension, Kidney Disease, Seizures, Stroke, Thyroid Problems, Tuberculosis. Social History Never smoker, Marital Status - Married, Alcohol Use - Never, Drug Use - No History, Caffeine Use - Daily. Medical History Eyes Denies history of Cataracts, Glaucoma, Optic Neuritis Ear/Nose/Mouth/Throat Denies history of Chronic sinus problems/congestion, Middle ear problems Hematologic/Lymphatic Patient has history of Anemia, [name] Cell Disease Denies history of Hemophilia, Human Immunodeficiency Virus, Lymphedema Respiratory Denies history of Aspiration, Asthma, Chronic Obstructive Pulmonary Disease (COPD), Pneumothorax, Sleep Apnea, Tuberculosis Cardiovascular Denies history of Angina, Arrhythmia, Congestive Heart Failure, Coronary Artery Disease, Deep Vein Thrombosis, Hypertension, Hypotension, Myocardial Infarction, Peripheral Arterial Disease, Peripheral Venous Disease, Phlebitis, Vasculitis Gastrointestinal Denies history of Cirrhosis , Colitis, Crohnoos, Hepatitis A, Hepatitis B, Hepatitis C Endocrine Denies history of Type I Diabetes, Type II Diabetes Genitourinary Denies history of End Stage Renal Disease Immunological Denies history of Lupus Erythematosus, Raynaudoos, Scleroderma Integumentary (Skin) Denies history of History of Burn Musculoskeletal Denies history of Gout, Rheumatoid Arthritis, Osteoarthritis, Osteomyelitis Neurologic Patient has history of Neuropathy - right foot intermittant Denies history of Dementia, Quadriplegia, Paraplegia, Seizure Disorder Oncologic Denies history of Received Chemotherapy, Received Radiation Psychiatric Denies history of Anorexia/bulimia, Confinement Anxiety Hospitalization/Surgery History - c section x2. - left breast lumpectomy. - iandD right ankle with theraskin. Medical A Surgical History Notes nd Constitutional Symptoms (General  Health) H/O miscarriage Cardiovascular bradycardia Gastrointestinal cholilithiasis Objective Constitutional No acute distress.. Vitals Time Taken: 10:35 AM, Height: 67 in, Temperature: 98.4 F, Pulse: 80 bpm, Respiratory Rate: 18 breaths/min, Blood Pressure: 135/77 mmHg. Respiratory Normal work of breathing on room air.. General Notes: 11/15/2021: Both wounds are smaller  today. There is some slough accumulation on the surfaces, but the medial wound, in particular looks like it is filling in and is less deep. Integumentary (Hair, Skin) Wound #17 status is Open. Original cause of wound was Gradually Appeared. The date acquired was: 10/05/2012. The wound has been in treatment 30 [name]. The wound is located on the Right,Lateral Lower Leg. The wound measures 6cm length x 4.5cm width x 0.1cm depth; 21.206cm^2 area and 2.121cm^3 volume. There is Fat Layer (Subcutaneous Tissue) exposed. There is no tunneling or undermining noted. There is a medium amount of purulent drainage noted. The wound margin is flat and intact. There is small (1-33%) pink, pale granulation within the wound bed. There is a large (67-100%) amount of necrotic tissue within the wound bed including Adherent Slough. Wound #21 status is Open. Original cause of wound was Gradually Appeared. The date acquired was: 06/26/2021. The wound has been in treatment 20 [name]. The wound is located on the Right,Medial Ankle. The wound measures 4.8cm length x 2.7cm width x 0.1cm depth; 10.179cm^2 area and 1.018cm^3 volume. There is Fat Layer (Subcutaneous Tissue) exposed. There is no tunneling or undermining noted. There is a medium amount of purulent drainage noted. The wound margin is distinct with the outline attached to the wound base. There is small (1-33%) pink granulation within the wound bed. There is a large (67-100%) amount of necrotic tissue within the wound bed including Adherent Slough. 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 There was a Selective/Open Wound Non-Viable Tissue Debridement with a total area of 27 sq cm performed by [name] Maudlin, MD. With the following instrument(s): Curette to remove Non-Viable tissue/material. Material removed includes [facility] after achieving pain control using Lidocaine 5% topical ointment. No specimens were taken. A time out was conducted at 11:02, prior to the start of the procedure. A Minimum amount of bleeding was controlled with Pressure. The procedure was tolerated well with a pain level of 0 throughout and a pain level of 0 following the procedure. Post Debridement Measurements: 6cm length x 4.5cm width x 0.1cm depth; 2.121cm^3 volume. Character of Wound/Ulcer Post Debridement is improved. Post procedure Diagnosis Wound #17: Same as Pre-Procedure Wound #21 Pre-procedure diagnosis of Wound #21 is a [name] Cell Lesion located on the Right,Medial Ankle .Severity of Tissue Pre Debridement is: Fat layer exposed. There was a Selective/Open Wound Non-Viable Tissue Debridement with a total area of 12.96 sq cm performed by [name] Maudlin, MD. With the following instrument(s): Curette to remove Non-Viable tissue/material. Material removed includes [facility] after achieving pain control using Lidocaine 5% topical ointment. No specimens were taken. A time out was conducted at 11:02, prior to the start of the procedure. A Minimum amount of bleeding was controlled with Pressure. The procedure was tolerated well with a pain level of 0 throughout and a pain level of 0 following the procedure. Post Debridement Measurements: 4.8cm length x 2.7cm width x 0.1cm depth; 1.018cm^3 volume. Character of Wound/Ulcer Post Debridement is improved. Severity of Tissue Post  Debridement is: Fat layer exposed. Post procedure Diagnosis Wound #21: Same as Pre-Procedure Plan Follow-up Appointments: Return Appointment in 1 week. - Dr. Celine Ahr - Room 3 Thursday 7/6 at 10:15 am,7/13 at 10:15 am Other: - PROGENA MATRIX #1 Cellular or Tissue Based Products: Other Cellular or Tissue Based  Products Orders/Instructions: - PROGENA MATRIX#2 Bathing/ Shower/ Hygiene: May shower with protection but do not get wound dressing(s) wet. - Can get cast protector bags at [facility] or [facility] Edema Control - Lymphedema / SCD / Other: Elevate legs to the level of the heart or above for 30 minutes daily and/or when sitting, a frequency of: - throughout the day Avoid standing for long periods of time. Exercise regularly Additional Orders / Instructions: Follow Nutritious Diet WOUND #17: - Lower Leg Wound Laterality: Right, Lateral Cleanser: Soap and Water 1 x Per Week/30 Days Discharge Instructions: May shower and wash wound with dial antibacterial soap and water prior to dressing change. Cleanser: Wound Cleanser 1 x Per Week/30 Days Discharge Instructions: Cleanse the wound with wound cleanser prior to applying a clean dressing using gauze sponges, not tissue or cotton balls. Peri-Wound Care: Triamcinolone 15 (g) 1 x Per Week/30 Days Discharge Instructions: Use triamcinolone 15 (g) as directed Peri-Wound Care: Sween Lotion (Moisturizing lotion) 1 x Per Week/30 Days Discharge Instructions: Apply moisturizing lotion as directed Topical: Mupirocin Ointment 1 x Per Week/30 Days Discharge Instructions: Apply Mupirocin (Bactroban) as instructed Prim Dressing: PROGENA MATRIX 1 x Per Week/30 Days ary Secondary Dressing: ABD Pad, 5x9 1 x Per Week/30 Days Discharge Instructions: Apply over primary dressing as directed. Secondary Dressing: Zetuvit Plus 4x8 in 1 x Per Week/30 Days Discharge Instructions: Apply over primary dressing as directed. Com pression Wrap: Kerlix Roll 4.5x3.1 (in/yd) 1 x  Per Week/30 Days Discharge Instructions: Apply Kerlix and Coban compression as directed. Com pression Wrap: Coban Self-Adherent Wrap 4x5 (in/yd) 1 x Per Week/30 Days Discharge Instructions: Apply over Kerlix as directed. WOUND #21: - Ankle Wound Laterality: Right, Medial Cleanser: Soap and Water 1 x Per Week/30 Days Discharge Instructions: May shower and wash wound with dial antibacterial soap and water prior to dressing change. Cleanser: Wound Cleanser 1 x Per Week/30 Days Discharge Instructions: Cleanse the wound with wound cleanser prior to applying a clean dressing using gauze sponges, not tissue or cotton balls. Peri-Wound Care: Triamcinolone 15 (g) 1 x Per Week/30 Days Discharge Instructions: Use triamcinolone 15 (g) as directed Peri-Wound Care: Sween Lotion (Moisturizing lotion) 1 x Per Week/30 Days Discharge Instructions: Apply moisturizing lotion as directed Topical: Mupirocin Ointment 1 x Per Week/30 Days Discharge Instructions: Apply Mupirocin (Bactroban) as instructed Prim Dressing: PROGENA MATRIX 1 x Per Week/30 Days ary Secondary Dressing: ABD Pad, 5x9 1 x Per Week/30 Days Discharge Instructions: Apply over primary dressing as directed. Secondary Dressing: Zetuvit Plus 4x8 in 1 x Per Week/30 Days Discharge Instructions: Apply over primary dressing as directed. Com pression Wrap: Kerlix Roll 4.5x3.1 (in/yd) 1 x Per Week/30 Days Discharge Instructions: Apply Kerlix and Coban compression as directed. Com pression Wrap: Coban Self-Adherent Wrap 4x5 (in/yd) 1 x Per Week/30 Days Discharge Instructions: Apply over Kerlix as directed. 11/15/2021: Both wounds are smaller today. There is some slough accumulation on the surfaces, but the medial wound, in particular looks like it is filling in and is less deep. I used a curette to debride slough off of both wounds. I then applied the trial skin substitute product (no charge) and secured in place with Adaptic and Steri- Strips. She will  follow-up in 1 week. Electronic Signature(s) Signed: 11/15/2021 11:21:39 AM By: [name] Maudlin MD FACS Entered By: [name] on 11/15/2021 11:21:38 -------------------------------------------------------------------------------- HxROS Details Patient Name: Date of Service: [name] [name], [name] 11/15/2021 10:15 A M Medical Record Number: [id number] Patient Account Number: [id number] Date of Birth/Sex: Treating RN:  1970/06/11 (51 y.o. [name] Primary Care Provider: Cammie [name] Other Clinician: Referring Provider: Treating Provider/Extender: [name] in Treatment: 30 Information Obtained From Patient Constitutional Symptoms (General Health) Medical History: Past Medical History Notes: H/O miscarriage Eyes Medical History: Negative for: Cataracts; Glaucoma; Optic Neuritis Ear/Nose/Mouth/Throat Medical History: Negative for: Chronic sinus problems/congestion; Middle ear problems Hematologic/Lymphatic Medical History: Positive for: Anemia; [name] Cell Disease Negative for: Hemophilia; Human Immunodeficiency Virus; Lymphedema Respiratory Medical History: Negative for: Aspiration; Asthma; Chronic Obstructive Pulmonary Disease (COPD); Pneumothorax; Sleep Apnea; Tuberculosis Cardiovascular Medical History: Negative for: Angina; Arrhythmia; Congestive Heart Failure; Coronary Artery Disease; Deep Vein Thrombosis; Hypertension; Hypotension; Myocardial Infarction; Peripheral Arterial Disease; Peripheral Venous Disease; Phlebitis; Vasculitis Past Medical History Notes: bradycardia Gastrointestinal Medical History: Negative for: Cirrhosis ; Colitis; Crohns; Hepatitis A; Hepatitis B; Hepatitis C Past Medical History Notes: cholilithiasis Endocrine Medical History: Negative for: Type I Diabetes; Type II Diabetes Genitourinary Medical History: Negative for: End Stage Renal Disease Immunological Medical History: Negative for: Lupus  Erythematosus; Raynauds; Scleroderma Integumentary (Skin) Medical History: Negative for: History of Burn Musculoskeletal Medical History: Negative for: Gout; Rheumatoid Arthritis; Osteoarthritis; Osteomyelitis Neurologic Medical History: Positive for: Neuropathy - right foot intermittant Negative for: Dementia; Quadriplegia; Paraplegia; Seizure Disorder Oncologic Medical History: Negative for: Received Chemotherapy; Received Radiation Psychiatric Medical History: Negative for: Anorexia/bulimia; Confinement Anxiety Immunizations Pneumococcal Vaccine: Received Pneumococcal Vaccination: No Implantable Devices None Hospitalization / Surgery History Type of Hospitalization/Surgery c section x2 left breast lumpectomy iandD right ankle with theraskin Family and Social History Cancer: No; Diabetes: Yes - Mother; Heart Disease: No; Hereditary Spherocytosis: No; Hypertension: No; Kidney Disease: No; Lung Disease: Yes - Mother; Seizures: No; Stroke: No; Thyroid Problems: No; Tuberculosis: No; Never smoker; Marital Status - Married; Alcohol Use: Never; Drug Use: No History; Caffeine Use: Daily; Financial Concerns: No; Food, Clothing or Shelter Needs: No; Support System Lacking: No; Transportation Concerns: No Electronic Signature(s) Signed: 11/15/2021 11:42:33 AM By: [name] Maudlin MD FACS Signed: 11/15/2021 6:30:20 PM By: [name] Catholic RN Entered By: [name] on 11/15/2021 11:19:58 -------------------------------------------------------------------------------- SuperBill Details Patient Name: Date of Service: [name], [name] 11/15/2021 Medical Record Number: [id number] Patient Account Number: [id number] Date of Birth/Sex: Treating RN: 1971/03/29 (51 y.o. 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 Facility Procedures CPT4 Code: [id number] Description: 7597 - DEBRIDE WOUND 1ST 20 SQ CM OR < ICD-10 Diagnosis Description L97.818 Non-pressure chronic ulcer of other part of right lower leg with other specified s Modifier: everity Quantity: 1 CPT4 Code: [id number] Description: 7598 - DEBRIDE WOUND EA ADDL 20 SQ CM ICD-10 Diagnosis Description L97.818 Non-pressure chronic ulcer of other part of right lower leg with other specified s Modifier: everity Quantity: 1 Physician Procedures : CPT4 Code Description Modifier [id number] - WC PHYS LEVEL 3 - EST PT 25 ICD-10 Diagnosis Description L97.818 Non-pressure chronic ulcer of other part of right lower leg with other specified severity D57.1 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

## 2021-11-22 ENCOUNTER — Telehealth: Payer: Self-pay

## 2021-11-22 ENCOUNTER — Encounter (HOSPITAL_BASED_OUTPATIENT_CLINIC_OR_DEPARTMENT_OTHER): Payer: Medicaid Other | Attending: General Surgery | Admitting: General Surgery

## 2021-11-22 ENCOUNTER — Other Ambulatory Visit: Payer: Self-pay | Admitting: Family Medicine

## 2021-11-22 DIAGNOSIS — D571 Sickle-cell disease without crisis: Secondary | ICD-10-CM | POA: Diagnosis not present

## 2021-11-22 DIAGNOSIS — L97818 Non-pressure chronic ulcer of other part of right lower leg with other specified severity: Secondary | ICD-10-CM | POA: Diagnosis not present

## 2021-11-22 DIAGNOSIS — G894 Chronic pain syndrome: Secondary | ICD-10-CM

## 2021-11-22 DIAGNOSIS — L97828 Non-pressure chronic ulcer of other part of left lower leg with other specified severity: Secondary | ICD-10-CM | POA: Insufficient documentation

## 2021-11-22 MED ORDER — OXYCODONE HCL 10 MG PO TABS: 10.0000 mg | ORAL_TABLET | ORAL | 0 refills | Status: DC

## 2021-11-22 MED ORDER — IBUPROFEN 600 MG PO TABS: 600.0000 mg | ORAL_TABLET | Freq: Four times a day (QID) | ORAL | 3 refills | Status: DC | PRN

## 2021-11-22 NOTE — Therapy (Signed)
OUTPATIENT PHYSICAL THERAPY LOWER EXTREMITY EVALUATION   Patient Name: [name] MRN: [id number] DOB:1970/09/21, 51 y.o., female Today's Date: 11/27/2021   PT End of Session - 11/27/21 1408     Visit Number 1    Number of Visits 16    Date for PT Re-Evaluation 01/22/22    Authorization Type [place] MEDICAID HEALTHY BLUE    PT Start Time 1325    PT Stop Time 1406    PT Time Calculation (min) 41 min    Activity Tolerance Patient tolerated treatment well    Behavior During Therapy WFL for tasks assessed/performed             Past Medical History:  Diagnosis Date   Anemia 03/30/2012   Hx of sickle cell disease   CAP (community acquired pneumonia)    2014   Cholelithiasis 03/30/2012   Chronic wound of extremity 04/01/2012   Elevated LFTs    Leg ulcer ([place]) 10/27/2012   Chronic under care of wound clinic   Multiple open wounds of lower extremity    chronic wounds B/LLE   Sickle cell disease ([place])    Sinus bradycardia by electrocardiogram 04/03/2012   Past Surgical History:  Procedure Laterality Date   ALLOGRAFT APPLICATION Right 11/29/4578   Procedure: SURGICAL PREP FOR GRAFTING RIGHT LOWER EXTREMITY AND APPLICATION OF Jannifer Hick;  Surgeon: Irene Limbo, MD;  Location: [facility];  Service: Plastics;  Laterality: Right;   APPLICATION OF A-CELL OF EXTREMITY Right 10/09/2015   Procedure: APPLICATION OF Jannifer Hick;  Surgeon: Irene Limbo, MD;  Location: [facility];  Service: Plastics;  Laterality: Right;   BREAST SURGERY     left breast cyst aspiration   CESAREAN SECTION     CESAREAN SECTION N/A 01/06/2014   Procedure: CESAREAN SECTION;  Surgeon: Emily Filbert, MD;  Location: [facility];  Service: Obstetrics;  Laterality: N/A;   CESAREAN SECTION N/A 05/04/2017   Procedure: CESAREAN SECTION;  Surgeon: Woodroe Mode, MD;  Location: [facility];  Service: Obstetrics;  Laterality: N/A;   I & D EXTREMITY Right 10/09/2015   Procedure: SURGICAL  PREPARATION FOR GRAFTING RIGHT ANKLE AND APPLICATION THERASKIN;  Surgeon: Irene Limbo, MD;  Location: [facility];  Service: Plastics;  Laterality: Right;   SKIN FULL THICKNESS GRAFT Bilateral 06/17/2016   Procedure: SURGICAL PREP FOR GRAFTING, BILATERAL LOWER EXTREMITIES AND APPLICATION OF Jannifer Hick;  Surgeon: Irene Limbo, MD;  Location: [facility];  Service: Plastics;  Laterality: Bilateral;   TONSILLECTOMY     Patient Active Problem List   Diagnosis Date Noted   Protein-calorie malnutrition, severe 04/06/2021   Bilateral leg ulcer ([place]) 03/28/2021   Low hemoglobin 05/01/2020   At risk for seizures due to oxygen toxicity 02/09/2020   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   Venous stasis 01/19/2020   Partial loss of skin graft 01/12/2020   Acute respiratory failure with hypoxia ([place]) 12/15/2019   Bone infarction of distal tibia, 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   Sickle cell anemia with crisis ([place]) 09/03/2018   Other chronic pain    Sickle cell crisis ([place]) 06/26/2018   Sickle cell pain crisis ([place]) 04/07/2018   Avascular necrosis of bone of hip, left ([place]) 12/08/2017   Avascular necrosis of bone of hip, right ([place]) 12/08/2017   Hb-SS disease without crisis ([place]) 11/26/2017   Anemia of chronic disease 11/26/2017   Abnormal quad screen    Ringworm of body 04/05/2017   Ulcer of right lower extremity ([place])    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[place]) 03/02/2017   History of pre-eclampsia 12/16/2016   Previous cesarean delivery x 2 07/22/2013   Hemochromatosis 11/10/2012   Chronic pain syndrome 99/37/1696   Complicated wound infection 04/01/2012   Leukocytosis 03/30/2012    PCP: [name], FNP  REFERRING PROVIDER: Fredirick Maudlin, MD  REFERRING DIAG: decreased ROM in left ankle  THERAPY DIAG:  Pain in left ankle and joints of left foot  Stiffness of left ankle, not elsewhere classified  Muscle weakness (generalized)  Other abnormalities of gait and  mobility  Rationale for Evaluation and Treatment Rehabilitation  ONSET DATE: 8 months ago.   SUBJECTIVE:   SUBJECTIVE STATEMENT: Pt states that she had an open wound on her L ankle for 8 months, which has since healed. Since healing she has lost some ROM, resulting in her walking on her tippee toes. Pt reports increased fatigue due to gait pattern. She also has secondary hip issues, requiring a THA. She states that she is awaiting visa's for her family to come help to while she under goes surgery.   PERTINENT HISTORY: Sickle Cell disease.   PAIN:  Are you having pain? Yes: NPRS scale: 2/10 Pain location: L ankle Pain description: Pulling sensation Aggravating factors: Walking, squatting, stairs Relieving factors: Sitting, laying down, pain medication.   PRECAUTIONS: None  WEIGHT BEARING RESTRICTIONS No  FALLS:  Has patient fallen in last 6 months? No  LIVING ENVIRONMENT: Lives with: lives with their family Lives in: House/apartment Stairs: Yes: Internal: 12 steps; on right going up Has following equipment at home: Single point cane and Walker - 4 wheeled  OCCUPATION: SAHM  PLOF: Independent  PATIENT GOALS Pt would like to be able to walk normally.    OBJECTIVE:   DIAGNOSTIC FINDINGS: None  PATIENT SURVEYS:  LEFS 19/80  COGNITION:  Overall cognitive status: Within functional limits for tasks assessed     SENSATION: WFL  POSTURE: No Significant postural limitations  PALPATION: None.   LOWER EXTREMITY ROM:  Active ROM Right AROM eval Left AROM eval Left PROM  Hip flexion WFL Mod limitation   Hip extension     Hip abduction Min limitation Severe limitation   Hip adduction WFL Severe limitation   Hip internal rotation Mod limitation Severe limitation   Hip external rotation Min limitation Severe limitation   Knee flexion [facility] WFL   Knee extension [facility] WFL   Ankle dorsiflexion NT  -10 (lacking) -8  Ankle plantarflexion NT  50   Ankle inversion NT  18    Ankle eversion NT  20    (Blank rows = not tested)  LOWER EXTREMITY MMT:  MMT Right eval Left eval  Hip flexion 5 3+  Knee flexion NT  5  Knee extension NT  5  Ankle dorsiflexion NT  4+  Ankle plantarflexion NT  4+  Ankle inversion NT  4  Ankle eversion NT  4   (Blank rows = not tested)  FUNCTIONAL TESTS:  Timed up and go (TUG): 19 sec  GAIT: Distance walked: 43f  Assistive device utilized: Single point cane Level of assistance: Complete Independence Comments: Use of SPC, walking on tippee toes on L LE.   TODAY'S TREATMENT: See HEP.   PATIENT EDUCATION:  Education details: Educated pt on anatomy and physiology of current symptoms, LEFS questionnaire, diagnosis, prognosis, HEP,  and POC. Person educated: Patient Education method: Explanation, Demonstration, Tactile cues, Verbal cues, and Handouts Education comprehension: verbalized understanding and returned demonstration  HOME EXERCISE PROGRAM:  Access Code: [email or web address] URL: [email or web address] Date: 11/27/2021 Prepared by: Rudi Heap  Exercises - Supine Single Leg Ankle Pumps  - 2 x daily - 7 x weekly - 3 sets - 10 reps - Ankle Alphabet in Elevation  - 2 x daily - 7 x weekly - 2 sets  ASSESSMENT:  CLINICAL IMPRESSION: Patient is a 51 y.o. F who was seen today for physical therapy evaluation and treatment for decreased ROM of L ankle, secondary to chronic wound. Pt demonstrates increased stiffness and scar tissue at achillis tendon with no tenderness with palpation. Visible scars noted, but no swelling or edema. Pt ambulates with antalgic gait pattern utilizing SPC. Due to limited DF mobility, pt unable to place heel on ground resulting in tippee toe walking on L LE only. Due to present wound on R side, examination limited on ROM and strength. Pt will benefit from skilled PT to address deficits.    OBJECTIVE IMPAIRMENTS Abnormal gait, decreased activity tolerance, decreased balance, decreased  endurance, decreased knowledge of condition, decreased mobility, difficulty walking, decreased ROM, decreased strength, impaired flexibility, impaired sensation, impaired tone, and pain.   ACTIVITY LIMITATIONS carrying, lifting, bending, standing, squatting, stairs, transfers, dressing, and caring for others  PARTICIPATION LIMITATIONS: meal prep, cleaning, laundry, shopping, and community activity  PERSONAL FACTORS Education, Past/current experiences, Time since onset of injury/illness/exacerbation, and 1 comorbidity: Sickle cell anemia  are also affecting patient's functional outcome.   REHAB POTENTIAL: Good  CLINICAL DECISION MAKING: Stable/uncomplicated  EVALUATION COMPLEXITY: Low   GOALS: Goals reviewed with patient? No  SHORT TERM GOALS: Target date: 12/25/2021  Pt will be I and compliant with initial HEP. Baseline:  Goal status: INITIAL  3.  Pt will increase LEFS score to 28/80 indicating minimal detectable change Baseline:  Goal status: INITIAL  LONG TERM GOALS: Target date: 01/22/2022   Pt will be I and compliant with comprhensiveHEP. Baseline:  Goal status: INITIAL  2.  Increase LEFS to 56/80 indicating minimal detectable change Baseline:  Goal status: INITIAL  3.  Pt will report <2/10 pain at baseline  Baseline:  Goal status: INITIAL  4.  Pt will improve DF to neutral on L LE. Baseline:  Goal status: INITIAL  5.  Pt will be able to ascend/ descend stairs with alternating gait patten.  Baseline:  Goal status: INITIAL  PLAN: PT FREQUENCY: 2x/week  PT DURATION: 8 weeks  PLANNED INTERVENTIONS: Therapeutic exercises, Therapeutic activity, Neuromuscular re-education, Balance training, Gait training, Patient/Family education, Joint manipulation, Joint mobilization, Stair training, Orthotic/Fit training, Aquatic Therapy, Dry Needling, Electrical stimulation, Cryotherapy, Moist heat, Manual lymph drainage, Compression bandaging, scar mobilization, Taping, Traction,  Ultrasound, Manual therapy, and Re-evaluation  PLAN FOR NEXT SESSION: Assess HEP/update PRN, continue to progress functional mobility, strengthen L LE musculature. Decrease patients pain and help minimize functional deficits.     Lynden Ang, PT 11/27/2021, 4:40 PM  Check all possible CPT codes: [phone] - PT Re-evaluation, 97110- Therapeutic Exercise, [phone]- Neuro Re-education, [phone] - Gait Training, [id number] - Manual Therapy, 97530 - Therapeutic Activities, 97535 - Self Care, [phone] - Mechanical traction, [id number] - Electrical stimulation (Manual), [id number] - Ultrasound, [id number] - Orthotic Fit, and [id number] - Aquatic therapy

## 2021-11-22 NOTE — Telephone Encounter (Signed)
Oxycodone  Ibuprofen  

## 2021-11-22 NOTE — Progress Notes (Signed)
Reviewed PDMP substance reporting system prior to prescribing opiate medications. No inconsistencies noted.  Meds ordered this encounter  Medications   Oxycodone HCl 10 MG TABS    Sig: Take 1 tablet (10 mg total) by mouth every 4 (four) hours while awake.    Dispense:  90 tablet    Refill:  0    Order Specific Question:   Supervising Provider    Answer:   Tresa Garter [[id number]]   ibuprofen (ADVIL) 600 MG tablet    Sig: Take 1 tablet (600 mg total) by mouth every 6 (six) hours as needed for mild pain.    Dispense:  30 tablet    Refill:  3    Order Specific Question:   Supervising Provider    Answer:   Tresa Garter [[id number]]   [name]  APRN, MSN, FNP-C [facility] [address] [place], [place] [address] [phone]

## 2021-11-22 NOTE — Progress Notes (Signed)
MILLETTE, HALBERSTAM ([id number]) Visit Report for 11/22/2021 Arrival Information Details Patient Name: Date of Service: [name] [name] 11/22/2021 10:15 A M Medical Record Number: [id number] Patient Account Number: [id number] Date of Birth/Sex: Treating RN: [birth year] (51 y.o. Donney Rankins, Lovena Le Primary Care Zebulon Gantt: [name] Other Clinician: Referring Tabita Corbo: Treating Miraj Truss/Extender: Elyse Jarvis Weeks in Treatment: 3 Visit Information History Since Last Visit Added or deleted any medications: No Patient Arrived: [name] Any new allergies or adverse reactions: No Arrival Time: 09:55 Had a fall or experienced change in No Accompanied By: self activities of daily living that may affect Transfer Assistance: None risk of falls: Patient Identification Verified: Yes Signs or symptoms of abuse/neglect since last visito No Secondary Verification Process Completed: Yes Hospitalized since last visit: No Patient Requires Transmission-Based Precautions: No Implantable device outside of the clinic excluding No Patient Has Alerts: No cellular tissue based products placed in the center since last visit: Has Dressing in Place as Prescribed: Yes Has Compression in Place as Prescribed: Yes Pain Present Now: No Electronic Signature(s) Signed: 11/22/2021 4:34:44 PM By: Adline Peals Entered By: Adline Peals on 11/22/2021 09:57:30 -------------------------------------------------------------------------------- Encounter Discharge Information Details Patient Name: Date of Service: [name], [name] 11/22/2021 10:15 A M Medical Record Number: [id number] Patient Account Number: [id number] Date of Birth/Sex: Treating RN: [birth year] (51 y.o. [name] Primary Care Laine Giovanetti: [name] Other Clinician: Referring Yadir Zentner: Treating Nikan Ellingson/Extender: Elyse Jarvis Weeks in Treatment: 31 Encounter Discharge Information Items  Post Procedure Vitals Discharge Condition: Stable Temperature (F): 98.3 Ambulatory Status: Cane Pulse (bpm): 101 Discharge Destination: Home Respiratory Rate (breaths/min): 16 Transportation: Private Auto Blood Pressure (mmHg): 136/72 Accompanied By: self Schedule Follow-up Appointment: Yes Clinical Summary of Care: Patient Declined Electronic Signature(s) Signed: 11/22/2021 5:09:07 PM By: Dellie Catholic RN Entered By: Dellie Catholic on 11/22/2021 17:07:54 -------------------------------------------------------------------------------- Lower Extremity Assessment Details Patient Name: Date of Service: [name], [name] [name] 11/22/2021 10:15 A M Medical Record Number: [id number] Patient Account Number: [id number] Date of Birth/Sex: Treating RN: [birth year] (51 y.o. [name] Primary Care Ambert Virrueta: [name] Other Clinician: Referring Quandra Fedorchak: Treating Magalene Mclear/Extender: Elyse Jarvis Weeks in Treatment: 31 Edema Assessment Assessed: [Left: No] [Right: No] Edema: [Left: Ye] [Right: s] Calf Left: Right: Point of Measurement: 33 cm From Medial Instep 28.6 cm Ankle Left: Right: Point of Measurement: 10 cm From Medial Instep 21.2 cm Vascular Assessment Pulses: Dorsalis Pedis Palpable: [Right:Yes] Electronic Signature(s) Signed: 11/22/2021 4:34:44 PM By: Adline Peals Entered By: Adline Peals on 11/22/2021 10:06:32 -------------------------------------------------------------------------------- Multi Wound Chart Details Patient Name: Date of Service: [name], [name] 11/22/2021 10:15 A M Medical Record Number: [id number] Patient Account Number: [id number] Date of Birth/Sex: Treating RN: [birth year] (51 y.o. [name] Primary Care Britanni Yarde: [name] Other Clinician: Referring Sharron Simpson: Treating Liviah Cake/Extender: Elyse Jarvis Weeks in Treatment: 31 Vital Signs Height(in): 67 Pulse(bpm):  101 Weight(lbs): Blood Pressure(mmHg): 136/72 Body Mass Index(BMI): Temperature(F): 98.3 Respiratory Rate(breaths/min): 16 Photos: [N/A:N/A] Right, Lateral Lower Leg Right, Medial Ankle N/A Wound Location: Gradually Appeared Gradually Appeared N/A Wounding Event: Sickle Cell Lesion Sickle Cell Lesion N/A Primary Etiology: N/A Venous Leg Ulcer N/A Secondary Etiology: Anemia, Sickle Cell Disease, Anemia, Sickle Cell Disease, N/A Comorbid History: Neuropathy Neuropathy 10/05/2012 06/26/2021 N/A Date Acquired: 56 21 N/A Weeks of Treatment: Open Open N/A Wound Status: No No N/A Wound Recurrence: Yes No N/A Clustered Wound: 7.4x5.3x0.1 5.6x2.5x0.1 N/A Measurements L x W x D (cm) 30.803 10.996 N/A A (cm) :  rea 3.08 1.1 N/A Volume (cm) : 83.60% 62.10% N/A % Reduction in Area: 83.60% 62.00% N/A % Reduction in Volume: Full Thickness Without Exposed Full Thickness Without Exposed N/A Classification: Support Structures Support Structures Medium Medium N/A Exudate Amount: Purulent Purulent N/A Exudate Type: yellow, brown, green yellow, brown, green N/A Exudate Color: Flat and Intact Distinct, outline attached N/A Wound Margin: Small (1-33%) Small (1-33%) N/A Granulation Amount: Pink, Pale Pink N/A Granulation Quality: Large (67-100%) Large (67-100%) N/A Necrotic Amount: Fat Layer (Subcutaneous Tissue): Yes Fat Layer (Subcutaneous Tissue): Yes N/A Exposed Structures: Fascia: No Fascia: No Tendon: No Tendon: No Muscle: No Muscle: No Joint: No Joint: No Bone: No Bone: No Small (1-33%) Small (1-33%) N/A Epithelialization: Treatment Notes Electronic Signature(s) Signed: 11/22/2021 12:31:08 PM By: Fredirick Maudlin MD FACS Signed: 11/22/2021 5:09:07 PM By: Dellie Catholic RN Entered By: Fredirick Maudlin on 11/22/2021 12:31:08 -------------------------------------------------------------------------------- Multi-Disciplinary Care Plan Details Patient Name: Date  of Service: [name], [name] 11/22/2021 10:15 A M Medical Record Number: [id number] Patient Account Number: [id number] Date of Birth/Sex: Treating RN: 04-Jul-1970 (51 y.o. [name] Primary Care Brecken Walth: [name] Other Clinician: Referring Dimitria Ketchum: Treating Koda Routon/Extender: Elyse Jarvis Weeks in Treatment: 46 Multidisciplinary Care Plan reviewed with physician Active Inactive Venous Leg Ulcer Nursing Diagnoses: Actual venous Insuffiency (use after diagnosis is confirmed) Knowledge deficit related to disease process and management Goals: Patient will maintain optimal edema control Date Initiated: 06/26/2021 Target Resolution Date: 12/14/2021 Goal Status: Active Interventions: Assess peripheral edema status every visit. Compression as ordered Treatment Activities: Therapeutic compression applied : 06/26/2021 Notes: Wound/Skin Impairment Nursing Diagnoses: Impaired tissue integrity Goals: Patient/caregiver will verbalize understanding of skin care regimen Date Initiated: 04/17/2021 Target Resolution Date: 12/14/2021 Goal Status: Active Ulcer/skin breakdown will have a volume reduction of 30% by week 4 Date Initiated: 04/17/2021 Date Inactivated: 05/29/2021 Target Resolution Date: 05/15/2021 Goal Status: Met Ulcer/skin breakdown will have a volume reduction of 50% by week 8 Date Initiated: 05/29/2021 Date Inactivated: 06/26/2021 Target Resolution Date: 06/26/2021 Goal Status: Unmet Unmet Reason: venous reflux Interventions: Assess patient/caregiver ability to obtain necessary supplies Assess patient/caregiver ability to perform ulcer/skin care regimen upon admission and as needed Assess ulceration(s) every visit Provide education on ulcer and skin care Treatment Activities: Topical wound management initiated : 04/17/2021 Notes: 06/08/21: Left leg wounds greater than 30% volume reduction, right leg acute infection. Electronic  Signature(s) Signed: 11/22/2021 4:34:44 PM By: Adline Peals Entered By: Adline Peals on 11/22/2021 10:11:22 -------------------------------------------------------------------------------- Pain Assessment Details Patient Name: Date of Service: [name], [place] [name] 11/22/2021 10:15 A M Medical Record Number: [id number] Patient Account Number: [id number] Date of Birth/Sex: Treating RN: 1971-03-05 (51 y.o. [name] Primary Care Connor Meacham: [name] Other Clinician: Referring Lamir Racca: Treating Natesha Hassey/Extender: Elyse Jarvis Weeks in Treatment: 31 Active Problems Location of Pain Severity and Description of Pain Patient Has Paino No Site Locations Rate the pain. Current Pain Level: 0 Pain Management and Medication Current Pain Management: Electronic Signature(s) Signed: 11/22/2021 4:34:44 PM By: Adline Peals Entered By: Adline Peals on 11/22/2021 09:59:18 -------------------------------------------------------------------------------- Patient/Caregiver Education Details Patient Name: Date of Service: [name], [name] 7/6/2023andnbsp10:15 [place] Record Number: [id number] Patient Account Number: [id number] Date of Birth/Gender: Treating RN: [birth year] (51 y.o. [name] Primary Care Physician: [name] Other Clinician: Referring Physician: Treating Physician/Extender: Elyse Jarvis Weeks in Treatment: 28 Education Assessment Education Provided To: Patient Education Topics Provided Wound/Skin Impairment: Methods: Explain/Verbal Responses: Reinforcements needed, State content correctly Electronic  Signature(s) Signed: 11/22/2021 4:34:44 PM By: Adline Peals Entered By: Adline Peals on 11/22/2021 10:11:31 -------------------------------------------------------------------------------- Wound Assessment Details Patient Name: Date of Service: [name], [name] [name]  11/22/2021 10:15 A M Medical Record Number: [id number] Patient Account Number: [id number] Date of Birth/Sex: Treating RN: [birth year] (51 y.o. [name] Primary Care Duwan Adrian: [name] Other Clinician: Referring Mylea Roarty: Treating Mayme Profeta/Extender: Elyse Jarvis Weeks in Treatment: 31 Wound Status Wound Number: 17 Primary Etiology: Sickle Cell Lesion Wound Location: Right, Lateral Lower Leg Wound Status: Open Wounding Event: Gradually Appeared Comorbid History: Anemia, Sickle Cell Disease, Neuropathy Date Acquired: 10/05/2012 Weeks Of Treatment: 31 Clustered Wound: Yes Photos Wound Measurements Length: (cm) 7.4 Width: (cm) 5.3 Depth: (cm) 0.1 Area: (cm) 30.803 Volume: (cm) 3.08 % Reduction in Area: 83.6% % Reduction in Volume: 83.6% Epithelialization: Small (1-33%) Tunneling: No Undermining: No Wound Description Classification: Full Thickness Without Exposed Support Structures Wound Margin: Flat and Intact Exudate Amount: Medium Exudate Type: Purulent Exudate Color: yellow, brown, green Foul Odor After Cleansing: No Slough/Fibrino Yes Wound Bed Granulation Amount: Small (1-33%) Exposed Structure Granulation Quality: Pink, Pale Fascia Exposed: No Necrotic Amount: Large (67-100%) Fat Layer (Subcutaneous Tissue) Exposed: Yes Necrotic Quality: Adherent Slough Tendon Exposed: No Muscle Exposed: No Joint Exposed: No Bone Exposed: No Treatment Notes Wound #17 (Lower Leg) Wound Laterality: Right, Lateral Cleanser Soap and Water Discharge Instruction: May shower and wash wound with dial antibacterial soap and water prior to dressing change. Wound Cleanser Discharge Instruction: Cleanse the wound with wound cleanser prior to applying a clean dressing using gauze sponges, not tissue or cotton balls. Peri-Wound Care Triamcinolone 15 (g) Discharge Instruction: Use triamcinolone 15 (g) as directed Sween Lotion (Moisturizing  lotion) Discharge Instruction: Apply moisturizing lotion as directed Topical Mupirocin Ointment Discharge Instruction: Apply Mupirocin (Bactroban) as instructed Primary Dressing PROGENA MATRIX Secondary Dressing ABD Pad, 5x9 Discharge Instruction: Apply over primary dressing as directed. Zetuvit Plus 4x8 in Discharge Instruction: Apply over primary dressing as directed. Secured With Compression Wrap Kerlix Roll 4.5x3.1 (in/yd) Discharge Instruction: Apply Kerlix and Coban compression as directed. Coban Self-Adherent Wrap 4x5 (in/yd) Discharge Instruction: Apply over Kerlix as directed. Compression Stockings Add-Ons Electronic Signature(s) Signed: 11/22/2021 4:34:44 PM By: Adline Peals Entered By: Adline Peals on 11/22/2021 10:08:54 -------------------------------------------------------------------------------- Wound Assessment Details Patient Name: Date of Service: [name], [name] 11/22/2021 10:15 A M Medical Record Number: [id number] Patient Account Number: [id number] Date of Birth/Sex: Treating RN: 09-12-1970 (51 y.o. [name] Primary Care Yishai Rehfeld: [name] Other Clinician: Referring Nakiah Osgood: Treating Idonna Heeren/Extender: Elyse Jarvis Weeks in Treatment: 31 Wound Status Wound Number: 21 Primary Etiology: Sickle Cell Lesion Wound Location: Right, Medial Ankle Secondary Etiology: Venous Leg Ulcer Wounding Event: Gradually Appeared Wound Status: Open Date Acquired: 06/26/2021 Comorbid History: Anemia, Sickle Cell Disease, Neuropathy Weeks Of Treatment: 21 Clustered Wound: No Photos Wound Measurements Length: (cm) 5.6 Width: (cm) 2.5 Depth: (cm) 0.1 Area: (cm) 10.996 Volume: (cm) 1.1 % Reduction in Area: 62.1% % Reduction in Volume: 62% Epithelialization: Small (1-33%) Tunneling: No Undermining: No Wound Description Classification: Full Thickness Without Exposed Support Structures Wound Margin: Distinct,  outline attached Exudate Amount: Medium Exudate Type: Purulent Exudate Color: yellow, brown, green Foul Odor After Cleansing: No Slough/Fibrino Yes Wound Bed Granulation Amount: Small (1-33%) Exposed Structure Granulation Quality: Pink Fascia Exposed: No Necrotic Amount: Large (67-100%) Fat Layer (Subcutaneous Tissue) Exposed: Yes Necrotic Quality: Adherent Slough Tendon Exposed: No Muscle Exposed: No Joint Exposed: No Bone Exposed: No Treatment Notes Wound #21 (Ankle)  Wound Laterality: Right, Medial Cleanser Soap and Water Discharge Instruction: May shower and wash wound with dial antibacterial soap and water prior to dressing change. Wound Cleanser Discharge Instruction: Cleanse the wound with wound cleanser prior to applying a clean dressing using gauze sponges, not tissue or cotton balls. Peri-Wound Care Triamcinolone 15 (g) Discharge Instruction: Use triamcinolone 15 (g) as directed Sween Lotion (Moisturizing lotion) Discharge Instruction: Apply moisturizing lotion as directed Topical Mupirocin Ointment Discharge Instruction: Apply Mupirocin (Bactroban) as instructed Primary Dressing PROGENA MATRIX Secondary Dressing ABD Pad, 5x9 Discharge Instruction: Apply over primary dressing as directed. Zetuvit Plus 4x8 in Discharge Instruction: Apply over primary dressing as directed. Secured With Compression Wrap Kerlix Roll 4.5x3.1 (in/yd) Discharge Instruction: Apply Kerlix and Coban compression as directed. Coban Self-Adherent Wrap 4x5 (in/yd) Discharge Instruction: Apply over Kerlix as directed. Compression Stockings Add-Ons Electronic Signature(s) Signed: 11/22/2021 4:34:44 PM By: Adline Peals Entered By: Adline Peals on 11/22/2021 10:09:12 -------------------------------------------------------------------------------- Vitals Details Patient Name: Date of Service: [name], [name] 11/22/2021 10:15 A M Medical Record Number: [id number] Patient Account  Number: [id number] Date of Birth/Sex: Treating RN: [birth year] (51 y.o. [name] Primary Care Peniel Biel: [name] Other Clinician: Referring Azalie Harbeck: Treating Aries Kasa/Extender: Elyse Jarvis Weeks in Treatment: 31 Vital Signs Time Taken: 09:58 Temperature (F): 98.3 Height (in): 67 Pulse (bpm): 101 Respiratory Rate (breaths/min): 16 Blood Pressure (mmHg): 136/72 Reference Range: 80 - 120 mg / dl Electronic Signature(s) Signed: 11/22/2021 4:34:44 PM By: Adline Peals Entered By: Adline Peals on 11/22/2021 09:58:48

## 2021-11-23 NOTE — Progress Notes (Signed)
AZORA, BONZO ([id number]) Visit Report for 11/22/2021 Chief Complaint Document Details Patient Name: Date of Service: [name] 11/22/2021 10:15 A M Medical Record Number: [id number] Patient Account Number: [id number] Date of Birth/Sex: Treating RN: 1970-12-11 (51 y.o. [name] Primary Care Provider: [name] Other Clinician: Referring Provider: Treating Provider/Extender: Elyse Jarvis Weeks in Treatment: 31 Information Obtained from: Patient Chief Complaint the patient is here for evaluation of her bilateral lower extremity sickle cell ulcers 04/17/2021; patient comes in for substantial wounds on the right and left lower leg Electronic Signature(s) Signed: 11/22/2021 12:31:19 PM By: Fredirick Maudlin MD FACS Entered By: Fredirick Maudlin on 11/22/2021 12:31:19 -------------------------------------------------------------------------------- Cellular or Tissue Based Product Details Patient Name: Date of Service: [name] [name], [name] 11/22/2021 10:15 A M Medical Record Number: [id number] Patient Account Number: [id number] Date of Birth/Sex: Treating RN: 1970/11/23 (51 y.o. [name] Primary Care Provider: [name] Other Clinician: Referring Provider: Treating Provider/Extender: Elyse Jarvis Weeks in Treatment: 31 Cellular or Tissue Based Product Type Wound #17 Right,Lateral Lower Leg Applied to: Performed By: Physician Fredirick Maudlin, MD Cellular or Tissue Based Product Type: Other Level of Consciousness (Pre-procedure): Awake and Alert Pre-procedure Verification/Time Out Yes - 10:45 Taken: Location: trunk / arms / legs Wound Size (sq cm): 39.22 Product Size (sq cm): 50 Waste Size (sq cm): 0 Amount of Product Applied (sq cm): 50 Instrument Used: Forceps, Scissors Lot #: [id number] Order #: 3 Expiration Date: 06/11/2024 Fenestrated: No Reconstituted: No Secured: Yes Secured With:  Steri-Strips Dressing Applied: Yes Primary Dressing: Adaptic;gauze Procedural Pain: 0 Post Procedural Pain: 0 Response to Treatment: Procedure was tolerated well Level of Consciousness (Post- Awake and Alert procedure): Post Procedure Diagnosis Same as Pre-procedure Electronic Signature(s) Signed: 11/22/2021 5:09:07 PM By: Dellie Catholic RN Signed: 11/23/2021 7:32:29 AM By: Fredirick Maudlin MD FACS Entered By: Dellie Catholic on 11/22/2021 16:56:32 -------------------------------------------------------------------------------- Cellular or Tissue Based Product Details Patient Name: Date of Service: [name], [name] 11/22/2021 10:15 A M Medical Record Number: [id number] Patient Account Number: [id number] Date of Birth/Sex: Treating RN: 1970-11-15 (51 y.o. [name] Primary Care Provider: [name] Other Clinician: Referring Provider: Treating Provider/Extender: Elyse Jarvis Weeks in Treatment: 31 Cellular or Tissue Based Product Type Wound #21 Right,Medial Ankle Applied to: Performed By: Physician Fredirick Maudlin, MD Cellular or Tissue Based Product Type: Other Level of Consciousness (Pre-procedure): Awake and Alert Pre-procedure Verification/Time Out Yes - 10:45 Taken: Location: trunk / arms / legs Wound Size (sq cm): 14 Product Size (sq cm): 50 Waste Size (sq cm): 0 Amount of Product Applied (sq cm): 50 Instrument Used: Forceps, Scissors Lot #: [id number] Order #: 3 Expiration Date: 06/11/2024 Fenestrated: No Reconstituted: No Secured: Yes Secured With: Steri-Strips Dressing Applied: Yes Primary Dressing: Adaptic;gauze Procedural Pain: 0 Post Procedural Pain: 0 Response to Treatment: Procedure was tolerated well Level of Consciousness (Post- Awake and Alert procedure): Post Procedure Diagnosis Same as Pre-procedure Notes Progena ***** TRIAL Electronic Signature(s) Signed: 11/22/2021 5:09:07 PM By: Dellie Catholic  RN Signed: 11/23/2021 7:32:29 AM By: Fredirick Maudlin MD FACS Entered By: Dellie Catholic on 11/22/2021 16:59:47 -------------------------------------------------------------------------------- Debridement Details Patient Name: Date of Service: [name], [name] 11/22/2021 10:15 A M Medical Record Number: [id number] Patient Account Number: [id number] Date of Birth/Sex: Treating RN: 06-Jun-1970 (51 y.o. [name] Primary Care Provider: [name] Other Clinician: Referring Provider: Treating Provider/Extender: Elyse Jarvis Weeks in Treatment: 31 Debridement Performed for Assessment: Wound #17 Right,Lateral  Lower Leg Performed By: Physician Fredirick Maudlin, MD Debridement Type: Debridement Level of Consciousness (Pre-procedure): Awake and Alert Pre-procedure Verification/Time Out Yes - 10:45 Taken: Start Time: 10:45 Pain Control: Lidocaine 4% T opical Solution T Area Debrided (L x W): otal 7.4 (cm) x 5.3 (cm) = 39.22 (cm) Tissue and other material debrided: Non-Viable, Slough, Slough Level: Non-Viable Tissue Debridement Description: Selective/Open Wound Instrument: Curette Bleeding: Minimum Hemostasis Achieved: Pressure End Time: 10:47 Procedural Pain: 0 Post Procedural Pain: 0 Response to Treatment: Procedure was tolerated well Level of Consciousness (Post- Awake and Alert procedure): Post Debridement Measurements of Total Wound Length: (cm) 7.4 Width: (cm) 5.3 Depth: (cm) 0.1 Volume: (cm) 3.08 Character of Wound/Ulcer Post Debridement: Improved Post Procedure Diagnosis Same as Pre-procedure Electronic Signature(s) Signed: 11/22/2021 5:09:07 PM By: Dellie Catholic RN Signed: 11/23/2021 7:32:29 AM By: Fredirick Maudlin MD FACS Entered By: Dellie Catholic on 11/22/2021 17:03:43 -------------------------------------------------------------------------------- Debridement Details Patient Name: Date of Service: [name], [name] 11/22/2021  10:15 A M Medical Record Number: [id number] Patient Account Number: [id number] Date of Birth/Sex: Treating RN: [birth year] (51 y.o. [name] Primary Care Provider: [name] Other Clinician: Referring Provider: Treating Provider/Extender: Elyse Jarvis Weeks in Treatment: 31 Debridement Performed for Assessment: Wound #21 Right,Medial Ankle Performed By: Physician Fredirick Maudlin, MD Debridement Type: Debridement Severity of Tissue Pre Debridement: Fat layer exposed Level of Consciousness (Pre-procedure): Awake and Alert Pre-procedure Verification/Time Out Yes - 10:45 Taken: Start Time: 10:45 Pain Control: Lidocaine 4% T opical Solution T Area Debrided (L x W): otal 5.6 (cm) x 2.5 (cm) = 14 (cm) Tissue and other material debrided: Non-Viable, Slough, Slough Level: Non-Viable Tissue Debridement Description: Selective/Open Wound Instrument: Curette Bleeding: Minimum Hemostasis Achieved: Pressure End Time: 10:47 Procedural Pain: 0 Post Procedural Pain: 0 Response to Treatment: Procedure was tolerated well Level of Consciousness (Post- Awake and Alert procedure): Post Debridement Measurements of Total Wound Length: (cm) 5.6 Width: (cm) 2.5 Depth: (cm) 0.1 Volume: (cm) 1.1 Character of Wound/Ulcer Post Debridement: Improved Severity of Tissue Post Debridement: Fat layer exposed Post Procedure Diagnosis Same as Pre-procedure Electronic Signature(s) Signed: 11/22/2021 5:09:07 PM By: Dellie Catholic RN Signed: 11/23/2021 7:32:29 AM By: Fredirick Maudlin MD FACS Entered By: Dellie Catholic on 11/22/2021 17:04:23 -------------------------------------------------------------------------------- HPI Details Patient Name: Date of Service: [name] [name], [name] 11/22/2021 10:15 A M Medical Record Number: [id number] Patient Account Number: [id number] Date of Birth/Sex: Treating RN: 25-Sep-1970 (51 y.o. [name] Primary Care Provider: [name] Other Clinician: Referring Provider: Treating Provider/Extender: Elyse Jarvis Weeks in Treatment: 31 History of Present Illness Location: medial and lateral ankle region on the right and left medial malleolus Quality: Patient reports experiencing a shooting pain to affected area(s). Severity: Patient states wound(s) are getting worse. Duration: right lower extremity bimalleolar ulcers have been present for approximately 2 years; the rright meedial malleolus ulcer has been there proximally 6 months Timing: Pain in wound is constant (hurts all the time) Context: The wound would happen gradually ssociated Signs and Symptoms: Patient reports having increase discharge. A HPI Description: 51 year old patient with a history of sickle cell anemia who was last seen by me with ulceration of the right lower extremity above the ankle and was referred to Dr. Leland Johns for a surgical debridement as I was unable to do anything in the office due to excruciating pain. At that stage she was referred from the plastic surgery service to dermatology who treated her for a skin infection with doxycycline and then Levaquin  and a local antibiotic ointment. I understand the patient has since developed ulceration on the left ankle both medial and lateral and was now referred back to the wound center as dermatology has finished the management. I do not have any notes from the dermatology department Old notes: 51 year old patient with a history of sickle cell anemia, pain bilateral lower extremities, right lower extremity ulcer and has a history of receiving a skin graft( Theraskin) several months ago. She has been visiting the wound center [facility] and was seen by Dr. Dellia Nims and Dr. Leland Johns. after prolonged conservator therapy between July 2016 and January 2017. She had been seen by the plastic surgeon and taken to the OR for debridement and application of Theraskin. She had 3 applications  of Theraskin and was then treated with collagen. Prior to that she had a history of similar problems in 2014 and was treated conservatively. Had a reflux study done for the right lower extremity in August 2016 without reflux or DVT . Past medical history significant for sickle cell disease, anemia, leg ulcers, cholelithiasis,and has never been a smoker. Once the patient was discharged on the wound center she says within 2 or 3 weeks the problems recurred and she has been treating it conservatively. since I saw her 3 weeks ago at [facility] she has been unable to get her dressing material but has completed a course of doxycycline. 6/7/ 2017 -- lower extremity venous duplex reflux evaluation was done No evidence of SVT or DVT in the RLL. No venous incompetence in the RLL. No further vascular workup is indicated at this time. She was seen by Dr. [name], on 10/04/2015. She agreed with the plan of taking her to the OR for debridement and application of theraskin and would also take biopsies to rule out pyoderma gangrenosum. Follow-up note dated May 31 received and she was status post application of Theraskin to multiple ulcers around the right ankle. Pathology did not show evidence of malignancy or pyoderma gangrenosum. She would continue to see as in the wound clinic for further care and see Dr. Leland Johns as needed. The patient brought the biopsy report and it was consistent with stasis ulcer no evidence of malignancy and the comment was that there was some adjacent neovascularization, fibrosis and patchy perivascular chronic inflammation. 11/15/2015 -- today we applied her first application of Theraskin 11/30/15; TheraSkin #2 12/13/2015 -- she is having a lot of pain locally and is here for possible application of a theraskin today. 01/16/2016 -- the patient has significant pain and has noticed despite in spite of all local care and oral pain medication. It is impossible to debride her in  the office. 02/06/2016 -- I do not see any notes from Dr. Iran Planas( the patient has not made a call to the office know as she heard from them) and the only visit to recently was with her PCP Dr. Danella Penton -- I saw her on 01/16/2016 and prescribed 90 tablets of oxycodone 10 mg and did lab work and screening for HIV. the HIV was negative and hemoglobin was 6.3 with a WBC count of 14.9 and hematocrit of 17.8 with platelets of 561. 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 The bimalleolar ulcers to the right lower extremity have been present for approximately 2 years, the left medial malleolus ulcer has been present approximately 6 months. She has followed with Dr.Thimmappa in the past and  has had a total of 3 applications of Theraskin (01/2015, 09/2015, 06/17/16). She has also followed with Dr. Con Memos here in the clinic and has received 2 applications of TheraSkin (11/10/15, 11/30/15). The patient does experience chronic, and is not amenable to debridement. 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 She is not currently on any antibiotic therapy and has not been treated with any recently. 07/17/2016 -- was seen by Dr. Iran Planas of plastic surgery who saw her 2 weeks postop application of Theraskin #3. She had removed her dressing and asked her to apply silver alginate on alternate days and follow-up back with the wound center. Future debridements and application of skin substitute would have to be done in the hospital due to her high risk for anesthesia. READMISSION 04/17/2021 Patient is now a 51 year old woman that we have had in this clinic for a prolonged period of time and 2016-2017 and then again for 2 visits in February 2018. At that point she had wounds on the right lower leg predominantly medial. She had also been seen by plastic surgery  Dr. Leland Johns who I believe took her to the OR for operative debridement and application of TheraSkin in 2017. After she left our clinic she was followed for a very prolonged period of time in the wound care center in [facility] who then referred her ultimately to [facility] where she was seen by Dr. Vernona Rieger. Again taken her to the OR for skin grafting which apparently did not take. She had multiple other attempts at dressings although I have not really looked over all of these notes in great detail. She has not been seen in a wound care center in about a year. She states over the last year in addition to her right lower leg she has developed wounds on the left lower leg quite extensive. She is using Xeroform to all of these wounds without really any improvement. She also has Medicaid which does not cover wound products. The patient has had vascular work-ups in the past including most recently on 03/28/2021 showing biphasic waveforms on the right triphasic at the PTA and biphasic at the dorsalis pedis on the left. She was unable to tolerate any degree of compression to do ABIs. Unfortunately TBI's were also not done. She had venous reflux studies done in 2017. This did not show any evidence of a DVT or SVT and no venous incompetence was noted in the right leg at the time this was the only side with the wound As noted I did not look all over her old records. She apparently had a course of HBO and [facility] although I am not sure what the indication would have been. In any case she developed seizures and terminated treatment earlier. She is generally much more disabled than when we last saw her in clinic. She can no longer walk pretty much wheelchair-bound because predominantly of pain in the left hip. 04/24/2021; the patient tolerated the wraps we put on. We used Santyl and Hydrofera Blue under compression. I brought her back for a nurse visit for a change in dressing. With Medicaid we will have a hard time  getting anything paid for and hence the need for compression. She arrives in clinic with all the wounds looking somewhat better in terms of surface 12/20; circumferential wound on the right from the lateral to the medial. She has open areas on the left medial and left lateral x2 on all of this with  the same surface. This does not look completely healthy although she does have some epithelialization. She is not complaining of a lot of pain which is unusual for her sickle ulcers. I have not looked over her extensive records from [facility]. She had recent arterial studies and has a history of venous reflux studies I will need to look these over although I do not believe she has significant arterial disease 2023 05/22/2021; patient's wound areas measure slightly smaller. Still a lot of drainage coming from the right we have been using Hydrofera Blue and Santyl with some improvement in the wound surfaces. She tells me she will be getting transfused later in the week for her underlying sickle cell anemia I have looked over her recent arterial studies which were done in the fall. This was in November and showed biphasic and triphasic waveforms but she could not tolerate ABIs because of pressure and unfortunately TBI's were not done. She has not had recent venous reflux studies that I can see 1/10; not much change about the same surface area. This has a yellowish surface to it very gritty. We have been using Santyl and Hydrofera Blue for a prolonged period. Culture I did last week showed methicillin sensitive staph aureus "rare". Our intake nurse reports greenish drainage which may be the Hydrofera Blue itself 1/17; wounds are continue to measure smaller although I am not sure about the accuracy here. Especially the areas on the right are covered in what looks to be a nonviable surface although she does have some epithelialization. Similarly she has areas on the left medial and left lateral ankle area which  appear to have a better surface and perhaps are slightly smaller. We have been using Santyl and Hydrofera Blue. She cannot tolerate mechanical debridements She went for her reflux studies which showed significant reflux at the greater saphenous vein at the saphenofemoral junction as well as the greater saphenous vein in the proximal calf on the left she had reflux in the thigh and the common femoral vein and supra vein Fishel vein reflux in the greater saphenous vein. I will have vein and vascular look at this. My thoughts have been that these are likely sickle wounds. I looked through her old records from [facility] wound care center and then when she graduated to [facility] wound care center where she saw Dr. Zigmund Daniel and Dr. Vernona Rieger. Although I can see she had reflux studies done I do not see that she actually saw a vein and vascular. I went over the fact that she had operative debridements and actual skin grafting that did not take. I do not think these wounds have ever really progressed towards healing 1/31;Substantial wounds on the right ankle area. Hyper granulated very gritty adherent debris on the surface. 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 On the left she has no DVT or SVT deep vein reflux in the common femoral vein. Superficial vein reflux in the greater saphenous vein on although the vein diameter is not really all that large. I do not think there is anything that can be done with these although I am going  to send her for consultation to vein and vascular. 2/7; Wound exam; substantial wound area on the right posterior  ankle area and areas on the left medial ankle and left lateral ankle. I was able to debride the left medial ankle last week fairly aggressively and it is back this week to a completely nonviable surface She will see vascular surgery this Friday and I would like them to review the venous studies and also any comments on her arterial status. If they do not see an issue here I am going to refer her to plastic surgery for an operative debridement perhaps intraoperative ACell or Integra. Eventually she will require a deep tissue culture again 2/14; substantial wound area on the right posterior ankle, medial ankle. We have been using silver alginate The patient was seen by vein and vascular she had both venous reflux studies and arterial studies. 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 She had no evidence of superficial venous thrombosis. She did have superficial vein reflux at the saphenofemoral junction and the greater saphenous vein. On the left no evidence of a DVT no evidence of superficial venous throat thrombosis she did have deep vein reflux in the common femoral vein and superficial vein reflux in the greater saphenous vein but these were not felt to be amenable to ablation. In terms of arterial studies she had triphasic and wife biphasic wave waveforms bilaterally not felt to have a significant arterial issue. I do not get the feeling that they felt that any part of her nonhealing wounds were related to either arterial or venous issues. They did note that she had venous reflux at the right at the [facility] and G[facility]. And also on the left there were reflux in the deep system at the common femoral vein and greater saphenous vein in the proximal thigh. Nothing amenable to ablation. 2/20; she is making some decent progress on the right where there is nice skin between the 2 open areas on the right ankle. The surfaces here do not  look viable yet there is some surrounding epithelialization. She still has a small area on the left medial ankle area. Hyper-granulated Jody's away always 2/28 patient has an appointment with plastic surgery on 3/8. We will see her back on 3/9. She may have to call us to get the area redressed. We've been using Santyl under silver alginate. We made a nice improvement on the left medial ankle. The larger wounds on the right also looks somewhat better in terms of epithelialization although I think they could benefit from an aggressive debridement if plastic surgery would be willing to do that. Perhaps placement of Integra or a cell 07/26/2021: She saw Dr. Claudia Desanctis yesterday. He raised the question as to whether or not this might be pyoderma and wanted to wait until that question was answered by dermatology before proceeding with any sort of operative debridement. We have continue to use Santyl under silver alginate with Kerlix and Coban wraps. Overall, her wounds appear to be continuing to contract and epithelialize, with some granulation tissue present. There continues to be some slough on all wound surfaces. 08/09/2021: She has not been able to get an appointment with dermatology because apparently the offices in [place] do not accept Medicaid. She is looking into whether or not she can be seen at the main [facility] dermatology clinic. This is necessary because plastic surgery  is concerned that her wounds might represent pyoderma and they did not want to do any procedure until that was clarified. We have been using Santyl under silver alginate with Kerlix and Coban wraps. Today, there was a greater amount of drainage on her dressings with a slight green discoloration and significant odor. Despite this, her wounds continue to contract and epithelialize. There is pale granulation tissue present and actually, on the left medial ankle, the granulation tissue is a bit  hypertrophic. 08/16/2021: Last week, I took a culture and this grew back rare methicillin-resistant Staph aureus and rare corynebacterium. The MRSA was sensitive to gentamicin which we began applying topically on an empiric basis. This week, her wounds are a bit smaller and the drainage and odor are less. Her primary care provider is working on assisting the patient with a dermatology evaluation. She has been in silver alginate over the gentamicin that was started last week along with Kerlix and Coban wraps. 08/23/2021: Because she has Medicaid, we have been unable to get her into see any dermatologist in the [place] to rule out pyoderma gangrenosum, which was a requirement from plastic surgery prior to any sort of debridement and grafting. Despite this, however, all of her wounds continue to get smaller. The wound on her left medial ankle is nearly closed. There is no odor from the wounds, although she still accumulates a modest amount of drainage on her dressings. 08/30/2021: The lateral right ankle wound and the medial left ankle wound are a bit smaller today. The medial right ankle wound is about the same size. They are less tender. We have still been unable to get her into dermatology. 09/06/2021: All of the wounds are about the same size today. She continues to endorse minimal pain. I communicated with Dr. Claudia Desanctis in plastic surgery regarding our issues getting a dermatology appointment; he was out of town but indicated that he would look into perhaps performing the biopsy in his office and will have his office contact her. 09/14/2021: The patient has an appointment in dermatology, but it is not until October. Her wounds are roughly the same; she continues to have very thick purulent-looking drainage on her dressings. 09/20/2021: The left medial wound is nearly closed and just has a bit of accumulated eschar on the surface. The right medial and lateral ankle wounds are perhaps a little bit smaller. They  continue to have a very pale surface with accumulation of thin slough. 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 I did not think [employer] was indicated based on this. She is getting topical mupirocin with Prisma silver collagen. 10/04/2021: The patient was not seen in clinic last week due to childcare coverage issues. In the interim, the left medial leg wound has closed. The right sided leg wounds are smaller. There is more granulation tissue coming through, particularly on the lateral wound. The surface remains somewhat gritty. We have been applying topical mupirocin and Prisma silver collagen. 10/11/2021: The left medial leg wound remains closed. She does complain of some anesthetic sensation to the area. Both of the right-sided leg wounds are smaller but still have accumulated slough. 10/18/2021: Both right-sided leg wounds are minimally smaller this week. She still continues to accumulate slough and has thick drainage on her dressings. 10/23/2021: Both wounds continue to contract. There is still slough buildup. She has been approved for a keratin-based skin substitute trial product but it will not be available until next week. 10/30/2021: The wounds are about  the same to perhaps slightly smaller. There is still continued slough buildup. Unfortunately, the rep for the keratin based product did not show up today and did not answer his phone when called. 11/08/2021: The wounds are little bit smaller today. She continues to have thick drainage but the surfaces are relatively clean with just a little bit of slough accumulation. She reported to me today that she is unable to completely flex her left ankle and on examination it seems this is potentially related to scar tissue from her wounds. We do have the ProgenaMatrix trial product available for her today. 11/15/2021: Both wounds are smaller today. There is some slough accumulation on the surfaces, but the medial wound, in particular  looks like it is filling in and is less deep. She did hear from physical therapy and she is going to start working with them on July 11. She is here for her second application of the trial skin substitute, ProgenaMatrix. 11/22/2021: Both wounds continue to contract, the medial more dramatically than the lateral. Both wounds have a layer of slough on the surface, but underneath this, the gritty fibrous tissue has a little bit more of a pink cast to it rather than being as pale as it has been. Electronic Signature(s) Signed: 11/22/2021 12:32:30 PM By: Fredirick Maudlin MD FACS Entered By: Fredirick Maudlin on 11/22/2021 12:32:30 -------------------------------------------------------------------------------- Physical Exam Details Patient Name: Date of Service: [name] [name], [name] 11/22/2021 10:15 A M Medical Record Number: [id number] Patient Account Number: [id number] Date of Birth/Sex: Treating RN: 04/06/1971 (51 y.o. [name] Primary Care Provider: Other Clinician: [name] Referring Provider: Treating Provider/Extender: Elyse Jarvis Weeks in Treatment: 31 Constitutional . Slightly tachycardic, asymptomatic. . . No acute distress.Marland Kitchen Respiratory Normal work of breathing on room air.. Notes 11/22/2021: Both wounds continue to contract, the medial more dramatically than the lateral. Both wounds have a layer of slough on the surface, but underneath this, the gritty fibrous tissue has a little bit more of a pink cast to it rather than being as pale as it has been. Electronic Signature(s) Signed: 11/22/2021 12:33:07 PM By: Fredirick Maudlin MD FACS Entered By: Fredirick Maudlin on 11/22/2021 12:33:07 -------------------------------------------------------------------------------- Physician Orders Details Patient Name: Date of Service: [name] [name], [name] 11/22/2021 10:15 A M Medical Record Number: [id number] Patient Account Number: [id number] Date of Birth/Sex: Treating  RN: [birth year] (51 y.o. 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 - Dr. Celine Ahr - Room 3 Thursday 7/13 at 10:15 am and 7/20 at 11am Return A Other: - PROGENA MATRIX #3 Cellular or Tissue Based Products Other Cellular or Tissue Based Products Orders/Instructions: - PROGENA MATRIX#3 Bathing/ Shower/ Hygiene May shower with protection but do not get wound dressing(s) wet. - Can get cast protector bags at [facility] or [facility] Edema Control - Lymphedema / SCD / Other Elevate legs to the level of the heart or above for 30 minutes daily and/or when sitting, a frequency of: - throughout the day Avoid standing for long periods of time. Exercise regularly Additional Orders / Instructions Follow Nutritious Diet Wound Treatment Wound #17 - Lower Leg Wound Laterality: Right, Lateral Cleanser: Soap and Water 1 x Per Week/30 Days Discharge Instructions:  May shower and wash wound with dial antibacterial soap and water prior to dressing change. Cleanser: Wound Cleanser 1 x Per Week/30 Days Discharge Instructions: Cleanse the wound with wound cleanser prior to applying a clean dressing using gauze sponges, not tissue or cotton balls. Peri-Wound Care: Triamcinolone 15 (g) 1 x Per Week/30 Days Discharge Instructions: Use triamcinolone 15 (g) as directed Peri-Wound Care: Sween Lotion (Moisturizing lotion) 1 x Per Week/30 Days Discharge Instructions: Apply moisturizing lotion as directed Topical: Mupirocin Ointment 1 x Per Week/30 Days Discharge Instructions: Apply Mupirocin (Bactroban) as instructed Prim Dressing:  Bell ary 1 x Per Week/30 Days Secondary Dressing: ABD Pad, 5x9 1 x Per Week/30 Days Discharge Instructions: Apply over primary dressing as directed. Secondary Dressing: Zetuvit Plus 4x8 in 1 x Per Week/30 Days Discharge Instructions: Apply over primary dressing as directed. Compression Wrap: Kerlix Roll 4.5x3.1 (in/yd) 1 x Per Week/30 Days Discharge Instructions: Apply Kerlix and Coban compression as directed. Compression Wrap: Coban Self-Adherent Wrap 4x5 (in/yd) 1 x Per Week/30 Days Discharge Instructions: Apply over Kerlix as directed. Wound #21 - Ankle Wound Laterality: Right, Medial Cleanser: Soap and Water 1 x Per Week/30 Days Discharge Instructions: May shower and wash wound with dial antibacterial soap and water prior to dressing change. Cleanser: Wound Cleanser 1 x Per Week/30 Days Discharge Instructions: Cleanse the wound with wound cleanser prior to applying a clean dressing using gauze sponges, not tissue or cotton balls. Peri-Wound Care: Triamcinolone 15 (g) 1 x Per Week/30 Days Discharge Instructions: Use triamcinolone 15 (g) as directed Peri-Wound Care: Sween Lotion (Moisturizing lotion) 1 x Per Week/30 Days Discharge Instructions: Apply moisturizing lotion as directed Topical: Mupirocin Ointment 1 x Per Week/30 Days Discharge Instructions: Apply Mupirocin (Bactroban) as instructed Prim Dressing: [place] ary 1 x Per Week/30 Days Secondary Dressing: ABD Pad, 5x9 1 x Per Week/30 Days Discharge Instructions: Apply over primary dressing as directed. Secondary Dressing: Zetuvit Plus 4x8 in 1 x Per Week/30 Days Discharge Instructions: Apply over primary dressing as directed. Compression Wrap: Kerlix Roll 4.5x3.1 (in/yd) 1 x Per Week/30 Days Discharge Instructions: Apply Kerlix and Coban compression as directed. Compression Wrap: Coban Self-Adherent Wrap 4x5 (in/yd) 1 x Per Week/30 Days Discharge Instructions: Apply over Kerlix as directed. Electronic  Signature(s) Signed: 11/22/2021 5:09:07 PM By: Dellie Catholic RN Signed: 11/23/2021 7:32:29 AM By: Fredirick Maudlin MD FACS Previous Signature: 11/22/2021 12:58:53 PM Version By: Fredirick Maudlin MD FACS Entered By: Dellie Catholic on 11/22/2021 17:08:37 -------------------------------------------------------------------------------- Problem List Details Patient Name: Date of Service: [name], [name] 11/22/2021 10:15 A M Medical Record Number: [id number] Patient Account Number: [id number] Date of Birth/Sex: Treating RN: 07/20/1970 (51 y.o. [name] Primary Care Provider: [name] Other Clinician: Referring Provider: Treating Provider/Extender: Elyse Jarvis Weeks in Treatment: 31 Active Problems ICD-10 Encounter Code Description Active Date MDM Diagnosis L97.818 Non-pressure chronic ulcer of other part of right lower leg with other specified 04/17/2021 No Yes severity 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 04/17/2021 No Yes Inactive Problems ICD-10 Code Description Active Date Inactive Date L97.828 Non-pressure chronic ulcer of other part of left lower leg with other specified severity 04/17/2021 04/17/2021 Resolved Problems Electronic Signature(s) Signed: 11/22/2021 12:31:01 PM By: Fredirick Maudlin MD FACS Entered By: Fredirick Maudlin on 11/22/2021 12:31:01 -------------------------------------------------------------------------------- Progress Note Details Patient Name: Date of Service: [name] [name], [name] 11/22/2021 10:15 A M Medical Record Number: [id number] Patient Account Number: [id number] Date of Birth/Sex: Treating RN: 05-Nov-1970 (51 y.o. [name] Primary Care Provider:  [name] Other Clinician: Referring Provider: Treating Provider/Extender: Elyse Jarvis Weeks in Treatment: 31 Subjective Chief Complaint Information obtained from Patient the patient is here for evaluation of her bilateral lower  extremity sickle cell ulcers 04/17/2021; patient comes in for substantial wounds on the right and left lower leg History of Present Illness (HPI) The following HPI elements were documented for the patient's wound: Location: medial and lateral ankle region on the right and left medial malleolus Quality: Patient reports experiencing a shooting pain to affected area(s). Severity: Patient states wound(s) are getting worse. Duration: right lower extremity bimalleolar ulcers have been present for approximately 2 years; the rright meedial malleolus ulcer has been there proximally 6 months Timing: Pain in wound is constant (hurts all the time) Context: The wound would happen gradually Associated Signs and Symptoms: Patient reports having increase discharge. 51 year old patient with a history of sickle cell anemia who was last seen by me with ulceration of the right lower extremity above the ankle and was referred to Dr. Leland Johns for a surgical debridement as I was unable to do anything in the office due to excruciating pain. At that stage she was referred from the plastic surgery service to dermatology who treated her for a skin infection with doxycycline and then Levaquin and a local antibiotic ointment. I understand the patient has since developed ulceration on the left ankle both medial and lateral and was now referred back to the wound center as dermatology has finished the management. I do not have any notes from the dermatology department Old notes: 51 year old patient with a history of sickle cell anemia, pain bilateral lower extremities, right lower extremity ulcer and has a history of receiving a skin graft( Theraskin) several months ago. She has been visiting the wound center [facility] and was seen by Dr. Dellia Nims and Dr. Leland Johns. after prolonged conservator therapy between July 2016 and January 2017. She had been seen by the plastic surgeon and taken to the OR for debridement and application  of Theraskin. She had 3 applications of Theraskin and was then treated with collagen. Prior to that she had a history of similar problems in 2014 and was treated conservatively. Had a reflux study done for the right lower extremity in August 2016 without reflux or DVT . Past medical history significant for sickle cell disease, anemia, leg ulcers, cholelithiasis,and has never been a smoker. Once the patient was discharged on the wound center she says within 2 or 3 weeks the problems recurred and she has been treating it conservatively. since I saw her 3 weeks ago at [facility] she has been unable to get her dressing material but has completed a course of doxycycline. 6/7/ 2017 -- lower extremity venous duplex reflux evaluation was done oo No evidence of SVT or DVT in the RLL. No venous incompetence in the RLL. No further vascular workup is indicated at this time. She was seen by Dr. [name], on 10/04/2015. She agreed with the plan of taking her to the OR for debridement and application of theraskin and would also take biopsies to rule out pyoderma gangrenosum. Follow-up note dated May 31 received and she was status post application of Theraskin to multiple ulcers around the right ankle. Pathology did not show evidence of malignancy or pyoderma gangrenosum. She would continue to see as in the wound clinic for further care and see Dr. Leland Johns as needed. The patient brought the biopsy report and it was consistent with stasis ulcer no evidence of malignancy and  the comment was that there was some adjacent neovascularization, fibrosis and patchy perivascular chronic inflammation. 11/15/2015 -- today we applied her first application of Theraskin 11/30/15; TheraSkin #2 12/13/2015 -- she is having a lot of pain locally and is here for possible application of a theraskin today. 01/16/2016 -- the patient has significant pain and has noticed despite in spite of all local care and oral pain medication.  It is impossible to debride her in the office. 02/06/2016 -- I do not see any notes from Dr. Iran Planas( the patient has not made a call to the office know as she heard from them) and the only visit to recently was with her PCP Dr. Danella Penton -- I saw her on 01/16/2016 and prescribed 90 tablets of oxycodone 10 mg and did lab work and screening for HIV. the HIV was negative and hemoglobin was 6.3 with a WBC count of 14.9 and hematocrit of 17.8 with platelets of 561. 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 The bimalleolar ulcers to the right lower extremity have been present for approximately 2 years, the left medial malleolus ulcer has been present approximately 6 months. She has followed with Dr.Thimmappa in the past and has had a total of 3 applications of Theraskin (01/2015, 09/2015, 06/17/16). She has also followed with Dr. Con Memos here in the clinic and has received 2 applications of TheraSkin (11/10/15, 11/30/15). The patient does experience chronic, and is not amenable to debridement. 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 She is not currently on any antibiotic therapy and has not been treated with any recently. 07/17/2016 -- was seen by Dr. Iran Planas of plastic surgery who saw her 2 weeks postop application of Theraskin #3. She had removed her dressing and asked her to apply silver alginate on alternate days and follow-up back with the wound center. Future debridements and application of skin substitute would have to be done in the hospital due to her high risk for anesthesia. READMISSION 04/17/2021 Patient is now a 51 year old woman that we have had in this clinic for a prolonged period of time and 2016-2017 and then again for 2 visits in February 2018. At that point she had wounds on the right lower leg predominantly medial. She had  also been seen by plastic surgery Dr. Leland Johns who I believe took her to the OR for operative debridement and application of TheraSkin in 2017. After she left our clinic she was followed for a very prolonged period of time in the wound care center in [facility] who then referred her ultimately to [facility] where she was seen by Dr. Vernona Rieger. Again taken her to the OR for skin grafting which apparently did not take. She had multiple other attempts at dressings although I have not really looked over all of these notes in great detail. She has not been seen in a wound care center in about a year. She states over the last year in addition to her right lower leg she has developed wounds on the left lower leg quite extensive. She is using Xeroform to all of these wounds without really any improvement. She also has Medicaid which does not cover wound products. The patient has had vascular work-ups in the past including most recently on 03/28/2021 showing biphasic waveforms on the right triphasic at the PTA and biphasic at the dorsalis pedis on the left. She  was unable to tolerate any degree of compression to do ABIs. Unfortunately TBI's were also not done. She had venous reflux studies done in 2017. This did not show any evidence of a DVT or SVT and no venous incompetence was noted in the right leg at the time this was the only side with the wound As noted I did not look all over her old records. She apparently had a course of HBO and [facility] although I am not sure what the indication would have been. In any case she developed seizures and terminated treatment earlier. She is generally much more disabled than when we last saw her in clinic. She can no longer walk pretty much wheelchair-bound because predominantly of pain in the left hip. 04/24/2021; the patient tolerated the wraps we put on. We used Santyl and Hydrofera Blue under compression. I brought her back for a nurse visit for a change in dressing. With  Medicaid we will have a hard time getting anything paid for and hence the need for compression. She arrives in clinic with all the wounds looking somewhat better in terms of surface 12/20; circumferential wound on the right from the lateral to the medial. She has open areas on the left medial and left lateral x2 on all of this with the same surface. This does not look completely healthy although she does have some epithelialization. She is not complaining of a lot of pain which is unusual for her sickle ulcers. I have not looked over her extensive records from [facility]. She had recent arterial studies and has a history of venous reflux studies I will need to look these over although I do not believe she has significant arterial disease 2023 05/22/2021; patient's wound areas measure slightly smaller. Still a lot of drainage coming from the right we have been using Hydrofera Blue and Santyl with some improvement in the wound surfaces. She tells me she will be getting transfused later in the week for her underlying sickle cell anemia I have looked over her recent arterial studies which were done in the fall. This was in November and showed biphasic and triphasic waveforms but she could not tolerate ABIs because of pressure and unfortunately TBI's were not done. She has not had recent venous reflux studies that I can see 1/10; not much change about the same surface area. This has a yellowish surface to it very gritty. We have been using Santyl and Hydrofera Blue for a prolonged period. Culture I did last week showed methicillin sensitive staph aureus "rare". Our intake nurse reports greenish drainage which may be the Hydrofera Blue itself 1/17; wounds are continue to measure smaller although I am not sure about the accuracy here. Especially the areas on the right are covered in what looks to be a nonviable surface although she does have some epithelialization. Similarly she has areas on the left medial and  left lateral ankle area which appear to have a better surface and perhaps are slightly smaller. We have been using Santyl and Hydrofera Blue. She cannot tolerate mechanical debridements She went for her reflux studies which showed significant reflux at the greater saphenous vein at the saphenofemoral junction as well as the greater saphenous vein in the proximal calf on the left she had reflux in the thigh and the common femoral vein and supra vein Fishel vein reflux in the greater saphenous vein. I will have vein and vascular look at this. My thoughts have been that these are likely sickle wounds. I looked  through her old records from [facility] wound care center and then when she graduated to [facility] wound care center where she saw Dr. Zigmund Daniel and Dr. Vernona Rieger. Although I can see she had reflux studies done I do not see that she actually saw a vein and vascular. I went over the fact that she had operative debridements and actual skin grafting that did not take. I do not think these wounds have ever really progressed towards healing 1/31;Substantial wounds on the right ankle area. Hyper granulated very gritty adherent debris on the surface. 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 On the left she has no DVT or SVT deep vein reflux in the common femoral vein. Superficial vein reflux in the greater saphenous vein on although the vein diameter is not really all that large. I do not think there is anything that can be done with these although I am going to send her for consultation to vein and vascular. 2/7; Wound exam; substantial wound  area on the right posterior ankle area and areas on the left medial ankle and left lateral ankle. I was able to debride the left medial ankle last week fairly aggressively and it is back this week to a completely nonviable surface She will see vascular surgery this Friday and I would like them to review the venous studies and also any comments on her arterial status. If they do not see an issue here I am going to refer her to plastic surgery for an operative debridement perhaps intraoperative ACell or Integra. Eventually she will require a deep tissue culture again 2/14; substantial wound area on the right posterior ankle, medial ankle. We have been using silver alginate The patient was seen by vein and vascular she had both venous reflux studies and arterial studies. 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 She had no evidence of superficial venous thrombosis. She did have superficial vein reflux at the saphenofemoral junction and the greater saphenous vein. On the left no evidence of a DVT no evidence of superficial venous throat thrombosis she did have deep vein reflux in the common femoral vein and superficial vein reflux in the greater saphenous vein but these were not felt to be amenable to ablation. In terms of arterial studies she had triphasic and wife biphasic wave waveforms bilaterally not felt to have a significant arterial issue. I do not get the feeling that they felt that any part of her nonhealing wounds were related to either arterial or venous issues. They did note that she had venous reflux at the right at the [facility] and [facility]. And also on the left there were reflux in the deep system at the common femoral vein and greater saphenous vein in the proximal thigh. Nothing amenable to ablation. 2/20; she is making some decent progress on the right where there is nice skin between the 2 open areas on the right ankle.  The surfaces here do not look viable yet there is some surrounding epithelialization. She still has a small area on the  left medial ankle area. Hyper-granulated Jody's away always 2/28 patient has an appointment with plastic surgery on 3/8. We will see her back on 3/9. She may have to call us to get the area redressed. We've been using Santyl under silver alginate. We made a nice improvement on the left medial ankle. The larger wounds on the right also looks somewhat better in terms of epithelialization although I think they could benefit from an aggressive debridement if plastic surgery would be willing to do that. Perhaps placement of Integra or a cell 07/26/2021: She saw Dr. Claudia Desanctis yesterday. He raised the question as to whether or not this might be pyoderma and wanted to wait until that question was answered by dermatology before proceeding with any sort of operative debridement. We have continue to use Santyl under silver alginate with Kerlix and Coban wraps. Overall, her wounds appear to be continuing to contract and epithelialize, with some granulation tissue present. There continues to be some slough on all wound surfaces. 08/09/2021: She has not been able to get an appointment with dermatology because apparently the offices in [place] do not accept Medicaid. She is looking into whether or not she can be seen at the main [facility] dermatology clinic. This is necessary because plastic surgery is concerned that her wounds might represent pyoderma and they did not want to do any procedure until that was clarified. We have been using Santyl under silver alginate with Kerlix and Coban wraps. Today, there was a greater amount of drainage on her dressings with a slight green discoloration and significant odor. Despite this, her wounds continue to contract and epithelialize. There is pale granulation tissue present and actually, on the left medial ankle, the granulation tissue  is a bit hypertrophic. 08/16/2021: Last week, I took a culture and this grew back rare methicillin-resistant Staph aureus and rare corynebacterium. The MRSA was sensitive to gentamicin which we began applying topically on an empiric basis. This week, her wounds are a bit smaller and the drainage and odor are less. Her primary care provider is working on assisting the patient with a dermatology evaluation. She has been in silver alginate over the gentamicin that was started last week along with Kerlix and Coban wraps. 08/23/2021: Because she has Medicaid, we have been unable to get her into see any dermatologist in [place] to rule out pyoderma gangrenosum, which was a requirement from plastic surgery prior to any sort of debridement and grafting. Despite this, however, all of her wounds continue to get smaller. The wound on her left medial ankle is nearly closed. There is no odor from the wounds, although she still accumulates a modest amount of drainage on her dressings. 08/30/2021: The lateral right ankle wound and the medial left ankle wound are a bit smaller today. The medial right ankle wound is about the same size. They are less tender. We have still been unable to get her into dermatology. 09/06/2021: All of the wounds are about the same size today. She continues to endorse minimal pain. I communicated with Dr. Claudia Desanctis in plastic surgery regarding our issues getting a dermatology appointment; he was out of town but indicated that he would look into perhaps performing the biopsy in his office and will have his office contact her. 09/14/2021: The patient has an appointment in dermatology, but it is not until October. Her wounds are roughly the same; she continues to have very thick purulent-looking drainage on her dressings. 09/20/2021: The left medial wound  is nearly closed and just has a bit of accumulated eschar on the surface. The right medial and lateral ankle wounds are perhaps a little bit smaller.  They continue to have a very pale surface with accumulation of thin slough. 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 I did not think [employer] was indicated based on this. She is getting topical mupirocin with Prisma silver collagen. 10/04/2021: The patient was not seen in clinic last week due to childcare coverage issues. In the interim, the left medial leg wound has closed. The right sided leg wounds are smaller. There is more granulation tissue coming through, particularly on the lateral wound. The surface remains somewhat gritty. We have been applying topical mupirocin and Prisma silver collagen. 10/11/2021: The left medial leg wound remains closed. She does complain of some anesthetic sensation to the area. Both of the right-sided leg wounds are smaller but still have accumulated slough. 10/18/2021: Both right-sided leg wounds are minimally smaller this week. She still continues to accumulate slough and has thick drainage on her dressings. 10/23/2021: Both wounds continue to contract. There is still slough buildup. She has been approved for a keratin-based skin substitute trial product but it will not be available until next week. 10/30/2021: The wounds are about the same to perhaps slightly smaller. There is still continued slough buildup. Unfortunately, the rep for the keratin based product did not show up today and did not answer his phone when called. 11/08/2021: The wounds are little bit smaller today. She continues to have thick drainage but the surfaces are relatively clean with just a little bit of slough accumulation. She reported to me today that she is unable to completely flex her left ankle and on examination it seems this is potentially related to scar tissue from her wounds. We do have the ProgenaMatrix trial product available for her today. 11/15/2021: Both wounds are smaller today. There is some slough accumulation on the surfaces, but the medial wound, in  particular looks like it is filling in and is less deep. She did hear from physical therapy and she is going to start working with them on July 11. She is here for her second application of the trial skin substitute, ProgenaMatrix. 11/22/2021: Both wounds continue to contract, the medial more dramatically than the lateral. Both wounds have a layer of slough on the surface, but underneath this, the gritty fibrous tissue has a little bit more of a pink cast to it rather than being as pale as it has been. Patient History Information obtained from Patient. Family History Diabetes - Mother, Lung Disease - Mother, No family history of Cancer, Heart Disease, Hereditary Spherocytosis, Hypertension, Kidney Disease, Seizures, Stroke, Thyroid Problems, Tuberculosis. Social History Never smoker, Marital Status - Married, Alcohol Use - Never, Drug Use - No History, Caffeine Use - Daily. Medical History Eyes Denies history of Cataracts, Glaucoma, Optic Neuritis Ear/Nose/Mouth/Throat Denies history of Chronic sinus problems/congestion, Middle ear problems Hematologic/Lymphatic Patient has history of Anemia, Sickle Cell Disease Denies history of Hemophilia, Human Immunodeficiency Virus, Lymphedema Respiratory Denies history of Aspiration, Asthma, Chronic Obstructive Pulmonary Disease (COPD), Pneumothorax, Sleep Apnea, Tuberculosis Cardiovascular Denies history of Angina, Arrhythmia, Congestive Heart Failure, Coronary Artery Disease, Deep Vein Thrombosis, Hypertension, Hypotension, Myocardial Infarction, Peripheral Arterial Disease, Peripheral Venous Disease, Phlebitis, Vasculitis Gastrointestinal Denies history of Cirrhosis , Colitis, Crohnoos, Hepatitis A, Hepatitis B, Hepatitis C Endocrine Denies history of Type I Diabetes, Type II Diabetes Genitourinary Denies history of End Stage Renal Disease Immunological  Denies history of Lupus Erythematosus, Raynaudoos, Scleroderma Integumentary  (Skin) Denies history of History of Burn Musculoskeletal Denies history of Gout, Rheumatoid Arthritis, Osteoarthritis, Osteomyelitis Neurologic Patient has history of Neuropathy - right foot intermittant Denies history of Dementia, Quadriplegia, Paraplegia, Seizure Disorder Oncologic Denies history of Received Chemotherapy, Received Radiation Psychiatric Denies history of Anorexia/bulimia, Confinement Anxiety Hospitalization/Surgery History - c section x2. - left breast lumpectomy. - iandD right ankle with theraskin. Medical A Surgical History Notes nd Constitutional Symptoms (General Health) H/O miscarriage Cardiovascular bradycardia Gastrointestinal cholilithiasis Objective Constitutional Slightly tachycardic, asymptomatic. No acute distress.. Vitals Time Taken: 9:58 AM, Height: 67 in, Temperature: 98.3 F, Pulse: 101 bpm, Respiratory Rate: 16 breaths/min, Blood Pressure: 136/72 mmHg. Respiratory Normal work of breathing on room air.. General Notes: 11/22/2021: Both wounds continue to contract, the medial more dramatically than the lateral. Both wounds have a layer of slough on the surface, but underneath this, the gritty fibrous tissue has a little bit more of a pink cast to it rather than being as pale as it has been. Integumentary (Hair, Skin) Wound #17 status is Open. Original cause of wound was Gradually Appeared. The date acquired was: 10/05/2012. The wound has been in treatment 31 weeks. The wound is located on the Right,Lateral Lower Leg. The wound measures 7.4cm length x 5.3cm width x 0.1cm depth; 30.803cm^2 area and 3.08cm^3 volume. There is Fat Layer (Subcutaneous Tissue) exposed. There is no tunneling or undermining noted. There is a medium amount of purulent drainage noted. The wound margin is flat and intact. There is small (1-33%) pink, pale granulation within the wound bed. There is a large (67-100%) amount of necrotic tissue within the wound bed including Adherent  Slough. Wound #21 status is Open. Original cause of wound was Gradually Appeared. The date acquired was: 06/26/2021. The wound has been in treatment 21 weeks. The wound is located on the Right,Medial Ankle. The wound measures 5.6cm length x 2.5cm width x 0.1cm depth; 10.996cm^2 area and 1.1cm^3 volume. There is Fat Layer (Subcutaneous Tissue) exposed. There is no tunneling or undermining noted. There is a medium amount of purulent drainage noted. The wound margin is distinct with the outline attached to the wound base. There is small (1-33%) pink granulation within the wound bed. There is a large (67-100%) amount of necrotic tissue within the wound bed including Adherent Slough. 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 There was a Selective/Open Wound Non-Viable Tissue Debridement with a total area of 39.22 sq cm performed by Fredirick Maudlin, MD. With the following instrument(s): Curette to remove Non-Viable tissue/material. Material removed includes [facility] after achieving pain control using Lidocaine 4% Topical Solution. No specimens were taken. A time out was conducted at 10:45, prior to the start of the procedure. A Minimum amount of bleeding was controlled with Pressure. The procedure was tolerated well with a pain level of 0 throughout and a pain level of 0 following the procedure. Post Debridement Measurements: 7.4cm length x 5.3cm width x 0.1cm depth; 3.08cm^3 volume. Character of Wound/Ulcer Post Debridement is improved. Post procedure Diagnosis Wound #17: Same as Pre-Procedure Pre-procedure diagnosis of Wound #17 is a Sickle Cell Lesion located on the Right,Lateral Lower Leg. A skin graft procedure using a bioengineered skin substitute/cellular or  tissue based product was performed by Fredirick Maudlin, MD with  the following instrument(s): Forceps and Scissors. Other was applied and secured with Steri-Strips. 50 sq cm of product was utilized and 0 sq cm was wasted. Post Application, Adaptic;gauze was applied. A Time Out was conducted at 10:45, prior to the start of the procedure. The procedure was tolerated well with a pain level of 0 throughout and a pain level of 0 following the procedure. Post procedure Diagnosis Wound #17: Same as Pre-Procedure . Wound #21 Pre-procedure diagnosis of Wound #21 is a Sickle Cell Lesion located on the Right,Medial Ankle .Severity of Tissue Pre Debridement is: Fat layer exposed. There was a Selective/Open Wound Non-Viable Tissue Debridement with a total area of 14 sq cm performed by Fredirick Maudlin, MD. With the following instrument(s): Curette to remove Non-Viable tissue/material. Material removed includes [facility] after achieving pain control using Lidocaine 4% Topical Solution. No specimens were taken. A time out was conducted at 10:45, prior to the start of the procedure. A Minimum amount of bleeding was controlled with Pressure. The procedure was tolerated well with a pain level of 0 throughout and a pain level of 0 following the procedure. Post Debridement Measurements: 5.6cm length x 2.5cm width x 0.1cm depth; 1.1cm^3 volume. Character of Wound/Ulcer Post Debridement is improved. Severity of Tissue Post Debridement is: Fat layer exposed. Post procedure Diagnosis Wound #21: Same as Pre-Procedure Pre-procedure diagnosis of Wound #21 is a Sickle Cell Lesion located on the Right,Medial Ankle. A skin graft procedure using a bioengineered skin substitute/cellular or tissue based product was performed by Fredirick Maudlin, MD with the following instrument(s): Forceps and Scissors. Other was applied and secured with Steri-Strips. 50 sq cm of product was utilized and 0 sq cm was wasted. Post Application,  Adaptic;gauze was applied. A Time Out was conducted at 10:45, prior to the start of the procedure. The procedure was tolerated well with a pain level of 0 throughout and a pain level of 0 following the procedure. Post procedure Diagnosis Wound #21: Same as Pre-Procedure General Notes: Progena ***** TRIAL. Plan Follow-up Appointments: Return Appointment in 1 week. - Dr. Celine Ahr - Room 3 Thursday 7/13 at 10:15 am and 7/20 at 11am Other: - PROGENA MATRIX #3 Cellular or Tissue Based Products: Other Cellular or Tissue Based Products Orders/Instructions: - PROGENA MATRIX#3 Bathing/ Shower/ Hygiene: May shower with protection but do not get wound dressing(s) wet. - Can get cast protector bags at [facility] or [facility] Edema Control - Lymphedema / SCD / Other: Elevate legs to the level of the heart or above for 30 minutes daily and/or when sitting, a frequency of: - throughout the day Avoid standing for long periods of time. Exercise regularly Additional Orders / Instructions: Follow Nutritious Diet WOUND #17: - Lower Leg Wound Laterality: Right, Lateral Cleanser: Soap and Water 1 x Per Week/30 Days Discharge Instructions: May shower and wash wound with dial antibacterial soap and water prior to dressing change. Cleanser: Wound Cleanser 1 x Per Week/30 Days Discharge Instructions: Cleanse the wound with wound cleanser prior to applying a clean dressing using gauze sponges, not tissue or cotton balls. Peri-Wound Care: Triamcinolone 15 (g) 1 x Per Week/30 Days Discharge Instructions: Use triamcinolone 15 (g) as directed Peri-Wound Care: Sween Lotion (Moisturizing lotion) 1 x Per Week/30 Days Discharge Instructions: Apply moisturizing lotion as directed Topical: Mupirocin Ointment 1 x Per Week/30 Days Discharge Instructions: Apply Mupirocin (Bactroban) as instructed Prim Dressing: PROGENA MATRIX 1 x Per Week/30 Days ary Secondary Dressing: ABD Pad, 5x9 1 x Per Week/30 Days Discharge Instructions:  Apply over  primary dressing as directed. Secondary Dressing: Zetuvit Plus 4x8 in 1 x Per Week/30 Days Discharge Instructions: Apply over primary dressing as directed. Com pression Wrap: Kerlix Roll 4.5x3.1 (in/yd) 1 x Per Week/30 Days Discharge Instructions: Apply Kerlix and Coban compression as directed. Com pression Wrap: Coban Self-Adherent Wrap 4x5 (in/yd) 1 x Per Week/30 Days Discharge Instructions: Apply over Kerlix as directed. WOUND #21: - Ankle Wound Laterality: Right, Medial Cleanser: Soap and Water 1 x Per Week/30 Days Discharge Instructions: May shower and wash wound with dial antibacterial soap and water prior to dressing change. Cleanser: Wound Cleanser 1 x Per Week/30 Days Discharge Instructions: Cleanse the wound with wound cleanser prior to applying a clean dressing using gauze sponges, not tissue or cotton balls. Peri-Wound Care: Triamcinolone 15 (g) 1 x Per Week/30 Days Discharge Instructions: Use triamcinolone 15 (g) as directed Peri-Wound Care: Sween Lotion (Moisturizing lotion) 1 x Per Week/30 Days Discharge Instructions: Apply moisturizing lotion as directed Topical: Mupirocin Ointment 1 x Per Week/30 Days Discharge Instructions: Apply Mupirocin (Bactroban) as instructed Prim Dressing: PROGENA MATRIX 1 x Per Week/30 Days ary Secondary Dressing: ABD Pad, 5x9 1 x Per Week/30 Days Discharge Instructions: Apply over primary dressing as directed. Secondary Dressing: Zetuvit Plus 4x8 in 1 x Per Week/30 Days Discharge Instructions: Apply over primary dressing as directed. Com pression Wrap: Kerlix Roll 4.5x3.1 (in/yd) 1 x Per Week/30 Days Discharge Instructions: Apply Kerlix and Coban compression as directed. Com pression Wrap: Coban Self-Adherent Wrap 4x5 (in/yd) 1 x Per Week/30 Days Discharge Instructions: Apply over Kerlix as directed. 11/22/2021: Both wounds continue to contract, the medial more dramatically than the lateral. Both wounds have a layer of slough on the  surface, but underneath this, the gritty fibrous tissue has a little bit more of a pink cast to it rather than being as pale as it has been. I used a curette to debride the slough off of the wound surfaces. I then applied the third application of ProgenaMatrix (trial product, no charge) to the medial and lateral wounds on her right lower extremity. They were secured with Adaptic and Steri-Strips and a gauze sponge was used as a bolster. Follow-up in 1 week. Electronic Signature(s) Signed: 11/26/2021 8:09:56 AM By: Fredirick Maudlin MD FACS Previous Signature: 11/22/2021 12:36:15 PM Version By: Fredirick Maudlin MD FACS Entered By: Fredirick Maudlin on 11/26/2021 08:09:56 -------------------------------------------------------------------------------- HxROS Details Patient Name: Date of Service: [name] [name], [name] 11/22/2021 10:15 A M Medical Record Number: [id number] Patient Account Number: [id number] Date of Birth/Sex: Treating RN: Nov 20, 1970 (51 y.o. [name] Primary Care Provider: [name] Other Clinician: Referring Provider: Treating Provider/Extender: Elyse Jarvis Weeks in Treatment: 31 Information Obtained From Patient Constitutional Symptoms (General Health) Medical History: Past Medical History Notes: H/O miscarriage Eyes Medical History: Negative for: Cataracts; Glaucoma; Optic Neuritis Ear/Nose/Mouth/Throat Medical History: Negative for: Chronic sinus problems/congestion; Middle ear problems Hematologic/Lymphatic Medical History: Positive for: Anemia; Sickle Cell Disease Negative for: Hemophilia; Human Immunodeficiency Virus; Lymphedema Respiratory Medical History: Negative for: Aspiration; Asthma; Chronic Obstructive Pulmonary Disease (COPD); Pneumothorax; Sleep Apnea; Tuberculosis Cardiovascular Medical History: Negative for: Angina; Arrhythmia; Congestive Heart Failure; Coronary Artery Disease; Deep Vein Thrombosis; Hypertension;  Hypotension; Myocardial Infarction; Peripheral Arterial Disease; Peripheral Venous Disease; Phlebitis; Vasculitis Past Medical History Notes: bradycardia Gastrointestinal Medical History: Negative for: Cirrhosis ; Colitis; Crohns; Hepatitis A; Hepatitis B; Hepatitis C Past Medical History Notes: cholilithiasis Endocrine Medical History: Negative for: Type I Diabetes; Type II Diabetes Genitourinary Medical History: Negative for: End Stage Renal Disease  Immunological Medical History: Negative for: Lupus Erythematosus; Raynauds; Scleroderma Integumentary (Skin) Medical History: Negative for: History of Burn Musculoskeletal Medical History: Negative for: Gout; Rheumatoid Arthritis; Osteoarthritis; Osteomyelitis Neurologic Medical History: Positive for: Neuropathy - right foot intermittant Negative for: Dementia; Quadriplegia; Paraplegia; Seizure Disorder Oncologic Medical History: Negative for: Received Chemotherapy; Received Radiation Psychiatric Medical History: Negative for: Anorexia/bulimia; Confinement Anxiety Immunizations Pneumococcal Vaccine: Received Pneumococcal Vaccination: No Implantable Devices None Hospitalization / Surgery History Type of Hospitalization/Surgery c section x2 left breast lumpectomy iandD right ankle with theraskin Family and Social History Cancer: No; Diabetes: Yes - Mother; Heart Disease: No; Hereditary Spherocytosis: No; Hypertension: No; Kidney Disease: No; Lung Disease: Yes - Mother; Seizures: No; Stroke: No; Thyroid Problems: No; Tuberculosis: No; Never smoker; Marital Status - Married; Alcohol Use: Never; Drug Use: No History; Caffeine Use: Daily; Financial Concerns: No; Food, Clothing or Shelter Needs: No; Support System Lacking: No; Transportation Concerns: No Electronic Signature(s) Signed: 11/22/2021 12:58:53 PM By: Fredirick Maudlin MD FACS Signed: 11/22/2021 5:09:07 PM By: Dellie Catholic RN Entered By: Fredirick Maudlin on  11/22/2021 12:32:37 -------------------------------------------------------------------------------- SuperBill Details Patient Name: Date of Service: [name] [name], [place] [name] 11/22/2021 Medical Record Number: [id number] Patient Account Number: [id number] Date of Birth/Sex: Treating RN: 08-07-1970 (51 y.o. 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 Facility Procedures CPT4 Code: [id number] Description: [phone] - DEBRIDE WOUND 1ST 20 SQ CM OR < ICD-10 Diagnosis Description L97.818 Non-pressure chronic ulcer of other part of right lower leg with other specified s Modifier: everity Quantity: 1 CPT4 Code: [id number] Description: 01749 - DEBRIDE WOUND EA ADDL 20 SQ CM ICD-10 Diagnosis Description L97.818 Non-pressure chronic ulcer of other part of right lower leg with other specified s Modifier: everity Quantity: 2 Physician Procedures : CPT4 Code Description Modifier [id number] - WC PHYS LEVEL 3 - EST PT 25 ICD-10 Diagnosis Description L97.818 Non-pressure chronic ulcer of other part of right lower leg with other specified severity 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

## 2021-11-27 ENCOUNTER — Ambulatory Visit: Payer: Medicaid Other | Attending: General Surgery | Admitting: Physical Therapy

## 2021-11-27 ENCOUNTER — Other Ambulatory Visit: Payer: Self-pay

## 2021-11-27 DIAGNOSIS — M6281 Muscle weakness (generalized): Secondary | ICD-10-CM | POA: Insufficient documentation

## 2021-11-27 DIAGNOSIS — M25572 Pain in left ankle and joints of left foot: Secondary | ICD-10-CM | POA: Insufficient documentation

## 2021-11-27 DIAGNOSIS — M25672 Stiffness of left ankle, not elsewhere classified: Secondary | ICD-10-CM | POA: Diagnosis present

## 2021-11-27 DIAGNOSIS — R2689 Other abnormalities of gait and mobility: Secondary | ICD-10-CM | POA: Insufficient documentation

## 2021-11-29 ENCOUNTER — Encounter (HOSPITAL_BASED_OUTPATIENT_CLINIC_OR_DEPARTMENT_OTHER): Payer: Medicaid Other | Admitting: General Surgery

## 2021-11-29 DIAGNOSIS — L97818 Non-pressure chronic ulcer of other part of right lower leg with other specified severity: Secondary | ICD-10-CM | POA: Diagnosis not present

## 2021-11-29 NOTE — Addendum Note (Signed)
Addended by: Lynden Ang on: 11/29/2021 01:16 PM   Modules accepted: Orders

## 2021-11-30 NOTE — Therapy (Unsigned)
OUTPATIENT PHYSICAL THERAPY TREATMENT NOTE   Patient Name: [name] MRN: [id number] DOB:1970-07-14, 51 y.o., female Today's Date: 11/30/2021  PCP:  [name], FNP  REFERRING PROVIDER: Fredirick Maudlin, MD  END OF SESSION:    Past Medical History:  Diagnosis Date   Anemia 03/30/2012   Hx of sickle cell disease   CAP (community acquired pneumonia)    2014   Cholelithiasis 03/30/2012   Chronic wound of extremity 04/01/2012   Elevated LFTs    Leg ulcer ([place]) 10/27/2012   Chronic under care of wound clinic   Multiple open wounds of lower extremity    chronic wounds B/LLE   Sickle cell disease ([place])    Sinus bradycardia by electrocardiogram 04/03/2012   Past Surgical History:  Procedure Laterality Date   ALLOGRAFT APPLICATION Right 6/57/8469   Procedure: SURGICAL PREP FOR GRAFTING RIGHT LOWER EXTREMITY AND APPLICATION OF Jannifer Hick;  Surgeon: Irene Limbo, MD;  Location: [place];  Service: Plastics;  Laterality: Right;   APPLICATION OF A-CELL OF EXTREMITY Right 10/09/2015   Procedure: APPLICATION OF Jannifer Hick;  Surgeon: Irene Limbo, MD;  Location: [place];  Service: Plastics;  Laterality: Right;   BREAST SURGERY     left breast cyst aspiration   CESAREAN SECTION     CESAREAN SECTION N/A 01/06/2014   Procedure: CESAREAN SECTION;  Surgeon: Emily Filbert, MD;  Location: [facility];  Service: Obstetrics;  Laterality: N/A;   CESAREAN SECTION N/A 05/04/2017   Procedure: CESAREAN SECTION;  Surgeon: Woodroe Mode, MD;  Location: [place];  Service: Obstetrics;  Laterality: N/A;   I & D EXTREMITY Right 10/09/2015   Procedure: SURGICAL PREPARATION FOR GRAFTING RIGHT ANKLE AND APPLICATION THERASKIN;  Surgeon: Irene Limbo, MD;  Location: [place];  Service: Plastics;  Laterality: Right;   SKIN FULL THICKNESS GRAFT Bilateral 06/17/2016   Procedure: SURGICAL PREP FOR GRAFTING, BILATERAL LOWER EXTREMITIES AND  APPLICATION OF Jannifer Hick;  Surgeon: Irene Limbo, MD;  Location: [place];  Service: Plastics;  Laterality: Bilateral;   TONSILLECTOMY     Patient Active Problem List   Diagnosis Date Noted   Protein-calorie malnutrition, severe 04/06/2021   Bilateral leg ulcer ([place]) 03/28/2021   Low hemoglobin 05/01/2020   At risk for seizures due to oxygen toxicity 02/09/2020   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   Venous stasis 01/19/2020   Partial loss of skin graft 01/12/2020   Acute respiratory failure with hypoxia ([facility]) 12/15/2019   Bone infarction of distal tibia, 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   Sickle cell anemia with crisis ([place]) 09/03/2018   Other chronic pain    Sickle cell crisis ([place]) 06/26/2018   Sickle cell pain crisis ([place]) 04/07/2018   Avascular necrosis of bone of hip, left ([place]) 12/08/2017   Avascular necrosis of bone of hip, right ([place]) 12/08/2017   Hb-SS disease without crisis ([place]) 11/26/2017   Anemia of chronic disease 11/26/2017   Abnormal quad screen    Ringworm of body 04/05/2017   Ulcer of right lower extremity ([place])    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[place]) 03/02/2017   History of pre-eclampsia 12/16/2016   Previous cesarean delivery x 2 07/22/2013   Hemochromatosis 11/10/2012   Chronic pain syndrome 62/95/2841   Complicated wound infection 04/01/2012   Leukocytosis 03/30/2012    REFERRING DIAG: decreased ROM in left ankle  THERAPY DIAG:  No diagnosis found.  Rationale for Evaluation and Treatment Rehabilitation  PERTINENT HISTORY: Sickle Cell disease.   PRECAUTIONS: None  SUBJECTIVE: Pt states that she has a new wound on her L ankle due to her skin being so fragile. She states that she continues to have difficulty with gait.   PAIN:  Are you having pain? Yes: NPRS scale: 2/10 Pain location: L ankle Pain description: Pulling sensation Aggravating factors: Walking, squatting, stairs Relieving factors: Sitting, laying down, pain medication.   OBJECTIVE:     DIAGNOSTIC FINDINGS: None   PATIENT SURVEYS:  LEFS 19/80   COGNITION:           Overall cognitive status: Within functional limits for tasks assessed                          SENSATION: WFL   POSTURE: No Significant postural limitations   PALPATION: None.    LOWER EXTREMITY ROM:   Active ROM Right AROM eval Left AROM eval Left PROM  Hip flexion WFL Mod limitation    Hip extension        Hip abduction Min limitation Severe limitation    Hip adduction WFL Severe limitation    Hip internal rotation Mod limitation Severe limitation    Hip external rotation Min limitation Severe limitation    Knee flexion [facility] WFL    Knee extension [facility] WFL    Ankle dorsiflexion NT  -10 (lacking) -8  Ankle plantarflexion NT  50    Ankle inversion NT  18    Ankle eversion NT  20     (Blank rows = not tested)   LOWER EXTREMITY MMT:   MMT Right eval Left eval  Hip flexion 5 3+  Knee flexion NT  5  Knee extension NT  5  Ankle dorsiflexion NT  4+  Ankle plantarflexion NT  4+  Ankle inversion NT  4  Ankle eversion NT  4   (Blank rows = not tested)   FUNCTIONAL TESTS:  Timed up and go (TUG): 19 sec   GAIT: Distance walked: 77f  Assistive device utilized: Single point cane Level of assistance: Complete Independence Comments: Use of SPC, walking on tippee toes on L LE.    TODAY'S TREATMENT: [facility] Adult PT Treatment:                                                DATE: 12/04/2021 Therapeutic Exercise: Supine TKE into ball L LE 5 sec hold x10  SLR to tolerance x8 - painful in L hip Calf stretch on slant board 2 x 1 min Hamstring stretch- Pt has onset of pain prior to stretch  Lumbar rotations x 30 due to increased tension in L lumbar ms from holding hip up on L side with ambulation.  Manual Therapy: AP Talocrural joint mobs with OP into DF IASTYM to L gastroc  Self Care: Heel  lift, educated pt on wearing appropriate shoe to fit both feet to ensure heel lift stays under L  heel.    PATIENT EDUCATION:  Education details: Educated pt on anatomy and physiology of current symptoms, LEFS questionnaire, diagnosis, prognosis, HEP,  and POC. Person educated: Patient Education method: Explanation, Demonstration, Tactile cues, Verbal cues, and Handouts Education comprehension: verbalized understanding and returned demonstration   HOME EXERCISE PROGRAM: Access Code: [email or web address] URL: [email or web address] Date: 11/27/2021 Prepared by: SRudi Heap  Exercises - Supine Single Leg Ankle Pumps  - 2 x  daily - 7 x weekly - 3 sets - 10 reps - Ankle Alphabet in Elevation  - 2 x daily - 7 x weekly - 2 sets   ASSESSMENT:   CLINICAL IMPRESSION: Patient presents to first follow up appointment with continued gait deficits. Further evaluation of pt lead to notable leg length discrepancy on L LE compare to R. Provided pt with 4" heel lift with pt reporting ability to stand evenly with minimal pain. Due to compensations made over duration of injury, pt also presents with significant tension in L lumbar musculature. Pt educated on complications traveling up the posterior chain. Pt tolerated session well today reaching 3 degrees DF after performing IASTYM to gastrocs and AP talcrural mobs with OP into DF. Remainder of session with focus on proximal strengthening to tolerance. Pt also limited due to secondary in L hip. Pt will continue to benefit from skilled PT to address limitations along L LE.       OBJECTIVE IMPAIRMENTS Abnormal gait, decreased activity tolerance, decreased balance, decreased endurance, decreased knowledge of condition, decreased mobility, difficulty walking, decreased ROM, decreased strength, impaired flexibility, impaired sensation, impaired tone, and pain.    ACTIVITY LIMITATIONS carrying, lifting, bending, standing, squatting, stairs, transfers, dressing, and caring for others   PARTICIPATION LIMITATIONS: meal prep, cleaning, laundry, shopping, and  community activity   PERSONAL FACTORS Education, Past/current experiences, Time since onset of injury/illness/exacerbation, and 1 comorbidity: Sickle cell anemia  are also affecting patient's functional outcome.    REHAB POTENTIAL: Good   CLINICAL DECISION MAKING: Stable/uncomplicated   EVALUATION COMPLEXITY: Low     GOALS: Goals reviewed with patient? No   SHORT TERM GOALS: Target date: 12/25/2021  Pt will be I and compliant with initial HEP. Baseline:  Goal status: INITIAL   3.  Pt will increase LEFS score to 28/80 indicating minimal detectable change Baseline:  Goal status: INITIAL   LONG TERM GOALS: Target date: 01/22/2022    Pt will be I and compliant with comprhensiveHEP. Baseline:  Goal status: INITIAL   2.  Increase LEFS to 56/80 indicating minimal detectable change Baseline:  Goal status: INITIAL   3.  Pt will report <2/10 pain at baseline  Baseline:  Goal status: INITIAL   4.  Pt will improve DF to neutral on L LE. Baseline:  Goal status: INITIAL   5.  Pt will be able to ascend/ descend stairs with alternating gait patten.  Baseline:  Goal status: INITIAL   PLAN: PT FREQUENCY: 2x/week   PT DURATION: 8 weeks   PLANNED INTERVENTIONS: Therapeutic exercises, Therapeutic activity, Neuromuscular re-education, Balance training, Gait training, Patient/Family education, Joint manipulation, Joint mobilization, Stair training, Orthotic/Fit training, Aquatic Therapy, Dry Needling, Electrical stimulation, Cryotherapy, Moist heat, Manual lymph drainage, Compression bandaging, scar mobilization, Taping, Traction, Ultrasound, Manual therapy, and Re-evaluation   PLAN FOR NEXT SESSION: Assess HEP/update PRN, continue to progress functional mobility, strengthen L LE musculature. Decrease patients pain and help minimize functional deficits.     Lynden Ang, PT 11/30/2021, 11:35 AM

## 2021-11-30 NOTE — Progress Notes (Signed)
[name], [name] ([id number]) Visit Report for 11/29/2021 Arrival Information Details Patient Name: Date of Service: [name] 11/29/2021 10:15 A M Medical Record Number: [id number] Patient Account Number: [id number] Date of Birth/Sex: Treating RN: November 30, 1970 (51 y.o. [name] Primary Care Joyia Riehle: [name] Other Clinician: Referring Alfred Harrel: Treating Mcclellan Demarais/Extender: Elyse Jarvis Weeks in Treatment: 66 Visit Information History Since Last Visit Added or deleted any medications: No Patient Arrived: [name] Any new allergies or adverse reactions: No Arrival Time: 10:07 Had a fall or experienced change in No Accompanied By: self activities of daily living that may affect Transfer Assistance: None risk of falls: Patient Identification Verified: Yes Signs or symptoms of abuse/neglect since last visito No Secondary Verification Process Completed: Yes Hospitalized since last visit: No Patient Requires Transmission-Based Precautions: No Implantable device outside of the clinic excluding No Patient Has Alerts: No cellular tissue based products placed in the center since last visit: Has Dressing in Place as Prescribed: Yes Has Compression in Place as Prescribed: Yes Pain Present Now: Yes Electronic Signature(s) Signed: 11/30/2021 12:33:08 PM By: Sharyn Creamer RN, BSN Entered By: Sharyn Creamer on 11/29/2021 10:07:41 -------------------------------------------------------------------------------- Encounter Discharge Information Details Patient Name: Date of Service: [name], [name] 11/29/2021 10:15 A M Medical Record Number: [id number] Patient Account Number: [id number] Date of Birth/Sex: Treating RN: Sep 29, 1970 (51 y.o. [name] Primary Care Bernon Arviso: [name] Other Clinician: Referring Estella Malatesta: Treating Jaliel Deavers/Extender: Elyse Jarvis Weeks in Treatment: 32 Encounter Discharge Information Items  Post Procedure Vitals Discharge Condition: Stable Temperature (F): 98.6 Ambulatory Status: Cane Pulse (bpm): 88 Discharge Destination: Home Respiratory Rate (breaths/min): 16 Transportation: Private Auto Blood Pressure (mmHg): 117/65 Accompanied By: self Schedule Follow-up Appointment: Yes Clinical Summary of Care: Patient Declined Electronic Signature(s) Signed: 11/29/2021 5:30:20 PM By: Dellie Catholic RN Entered By: Dellie Catholic on 11/29/2021 17:29:56 -------------------------------------------------------------------------------- Lower Extremity Assessment Details Patient Name: Date of Service: [name], [place] [name] 11/29/2021 10:15 A M Medical Record Number: [id number] Patient Account Number: [id number] Date of Birth/Sex: Treating RN: 10-14-1970 (51 y.o. [name] Primary Care Mammie Meras: [name] Other Clinician: Referring Aundrey Elahi: Treating Leann Mayweather/Extender: Elyse Jarvis Weeks in Treatment: 32 Edema Assessment Assessed: [Left: No] [Right: No] Edema: [Left: Ye] [Right: s] Calf Left: Right: Point of Measurement: 33 cm From Medial Instep 30.5 cm Ankle Left: Right: Point of Measurement: 10 cm From Medial Instep 19.8 cm Vascular Assessment Pulses: Dorsalis Pedis Palpable: [Right:Yes] Electronic Signature(s) Signed: 11/30/2021 12:33:08 PM By: Sharyn Creamer RN, BSN Entered By: Sharyn Creamer on 11/29/2021 10:21:49 -------------------------------------------------------------------------------- Multi Wound Chart Details Patient Name: Date of Service: [name], [name] 11/29/2021 10:15 A M Medical Record Number: [id number] Patient Account Number: [id number] Date of Birth/Sex: Treating RN: 03/14/1971 (51 y.o. [name] Primary Care Marjoria Mancillas: [name] Other Clinician: Referring Lissie Hinesley: Treating Jonnatan Hanners/Extender: Elyse Jarvis Weeks in Treatment: 32 Vital Signs Height(in): 67 Pulse(bpm):  71 Weight(lbs): Blood Pressure(mmHg): 117/65 Body Mass Index(BMI): Temperature(F): 98.6 Respiratory Rate(breaths/min): 16 Photos: [N/A:N/A] Right, Lateral Lower Leg Right, Medial Ankle N/A Wound Location: Gradually Appeared Gradually Appeared N/A Wounding Event: Sickle Cell Lesion Sickle Cell Lesion N/A Primary Etiology: N/A Venous Leg Ulcer N/A Secondary Etiology: Anemia, Sickle Cell Disease, Anemia, Sickle Cell Disease, N/A Comorbid History: Neuropathy Neuropathy 10/05/2012 06/26/2021 N/A Date Acquired: 32 81 N/A Weeks of Treatment: Open Open N/A Wound Status: No No N/A Wound Recurrence: Yes No N/A Clustered Wound: 7x5.2x0.1 5.6x2.5x0.1 N/A Measurements L x W x D (cm) 28.588  10.996 N/A A (cm) : rea 2.859 1.1 N/A Volume (cm) : 84.80% 62.10% N/A % Reduction in A rea: 84.80% 62.00% N/A % Reduction in Volume: Full Thickness Without Exposed Full Thickness Without Exposed N/A Classification: Support Structures Support Structures Medium Medium N/A Exudate A mount: Purulent Purulent N/A Exudate Type: yellow, [name], green yellow, [name], green N/A Exudate Color: Flat and Intact Distinct, outline attached N/A Wound Margin: Small (1-33%) Small (1-33%) N/A Granulation A mount: Pink, Pale Pink N/A Granulation Quality: Large (67-100%) Large (67-100%) N/A Necrotic A mount: Fat Layer (Subcutaneous Tissue): Yes Fat Layer (Subcutaneous Tissue): Yes N/A Exposed Structures: Fascia: No Fascia: No Tendon: No Tendon: No Muscle: No Muscle: No Joint: No Joint: No Bone: No Bone: No Small (1-33%) Small (1-33%) N/A Epithelialization: Debridement - Selective/Open Wound Debridement - Selective/Open Wound N/A Debridement: Pre-procedure Verification/Time Out 10:40 10:40 N/A Taken: Lidocaine 4% Topical Solution Lidocaine 4% Topical Solution N/A Pain Control: [facility] N/A Tissue Debrided: Non-Viable Tissue Non-Viable Tissue N/A Level: 36.4 14 N/A Debridement A  (sq cm): rea Curette Curette N/A Instrument: Minimum Minimum N/A Bleeding: Pressure Pressure N/A Hemostasis A chieved: 0 0 N/A Procedural Pain: 0 0 N/A Post Procedural Pain: Procedure was tolerated well Procedure was tolerated well N/A Debridement Treatment Response: 7x5.2x0.1 5.6x2.5x0.1 N/A Post Debridement Measurements L x W x D (cm) 2.859 1.1 N/A Post Debridement Volume: (cm) Debridement N/A N/A Procedures Performed: Treatment Notes Electronic Signature(s) Signed: 11/29/2021 10:52:11 AM By: Fredirick Maudlin MD FACS Signed: 11/29/2021 5:30:20 PM By: Dellie Catholic RN Entered By: Fredirick Maudlin on 11/29/2021 10:52:11 -------------------------------------------------------------------------------- Multi-Disciplinary Care Plan Details Patient Name: Date of Service: [name], [name] 11/29/2021 10:15 A M Medical Record Number: [id number] Patient Account Number: [id number] Date of Birth/Sex: Treating RN: [birth year] (51 y.o. [name] Primary Care Keaundre Thelin: [name] Other Clinician: Referring Grier Czerwinski: Treating Edwyna Dangerfield/Extender: Elyse Jarvis Weeks in Treatment: 10 Multidisciplinary Care Plan reviewed with physician Active Inactive Venous Leg Ulcer Nursing Diagnoses: Actual venous Insuffiency (use after diagnosis is confirmed) Knowledge deficit related to disease process and management Goals: Patient will maintain optimal edema control Date Initiated: 06/26/2021 Target Resolution Date: 12/14/2021 Goal Status: Active Interventions: Assess peripheral edema status every visit. Compression as ordered Treatment Activities: Therapeutic compression applied : 06/26/2021 Notes: Wound/Skin Impairment Nursing Diagnoses: Impaired tissue integrity Goals: Patient/caregiver will verbalize understanding of skin care regimen Date Initiated: 04/17/2021 Target Resolution Date: 12/14/2021 Goal Status: Active Ulcer/skin breakdown will have a  volume reduction of 30% by week 4 Date Initiated: 04/17/2021 Date Inactivated: 05/29/2021 Target Resolution Date: 05/15/2021 Goal Status: Met Ulcer/skin breakdown will have a volume reduction of 50% by week 8 Date Initiated: 05/29/2021 Date Inactivated: 06/26/2021 Target Resolution Date: 06/26/2021 Goal Status: Unmet Unmet Reason: venous reflux Interventions: Assess patient/caregiver ability to obtain necessary supplies Assess patient/caregiver ability to perform ulcer/skin care regimen upon admission and as needed Assess ulceration(s) every visit Provide education on ulcer and skin care Treatment Activities: Topical wound management initiated : 04/17/2021 Notes: 06/08/21: Left leg wounds greater than 30% volume reduction, right leg acute infection. Electronic Signature(s) Signed: 11/30/2021 12:33:08 PM By: Sharyn Creamer RN, BSN Entered By: Sharyn Creamer on 11/29/2021 10:30:12 -------------------------------------------------------------------------------- Pain Assessment Details Patient Name: Date of Service: [name], [name] 11/29/2021 10:15 A M Medical Record Number: [id number] Patient Account Number: [id number] Date of Birth/Sex: Treating RN: 03/20/1971 (51 y.o. [name] Primary Care Argel Pablo: [name] Other Clinician: Referring Hazem Kenner: Treating Arael Piccione/Extender: Elyse Jarvis Weeks in Treatment: 59 Active Problems  Location of Pain Severity and Description of Pain Patient Has Paino Yes Site Locations Rate the pain. Rate the pain. Current Pain Level: 3 Pain Management and Medication Current Pain Management: Electronic Signature(s) Signed: 11/30/2021 12:33:08 PM By: Sharyn Creamer RN, BSN Entered By: Sharyn Creamer on 11/29/2021 10:09:02 -------------------------------------------------------------------------------- Patient/Caregiver Education Details Patient Name: Date of Service: [name], [name] 7/13/2023andnbsp10:15 [place] Number: [id number] Patient Account Number: [id number] Date of Birth/Gender: Treating RN: 1970/12/03 (51 y.o. [name] Primary Care Physician: [name] Other Clinician: Referring Physician: Treating Physician/Extender: Elyse Jarvis Weeks in Treatment: 36 Education Assessment Education Provided To: Patient Education Topics Provided Wound/Skin Impairment: Methods: Explain/Verbal Responses: State content correctly Electronic Signature(s) Signed: 11/30/2021 12:33:08 PM By: Sharyn Creamer RN, BSN Entered By: Sharyn Creamer on 11/29/2021 10:30:29 -------------------------------------------------------------------------------- Wound Assessment Details Patient Name: Date of Service: [name], [name] 11/29/2021 10:15 A M Medical Record Number: [id number] Patient Account Number: [id number] Date of Birth/Sex: Treating RN: 1970-06-30 (52 y.o. [name] Primary Care Fadel Clason: [name] Other Clinician: Referring Chika Cichowski: Treating Sheyli Horwitz/Extender: Elyse Jarvis Weeks in Treatment: 32 Wound Status Wound Number: 17 Primary Etiology: Sickle Cell Lesion Wound Location: Right, Lateral Lower Leg Wound Status: Open Wounding Event: Gradually Appeared Comorbid History: Anemia, Sickle Cell Disease, Neuropathy Date Acquired: 10/05/2012 Weeks Of Treatment: 32 Clustered Wound: Yes Photos Wound Measurements Length: (cm) 7 Width: (cm) 5.2 Depth: (cm) 0.1 Area: (cm) 28.588 Volume: (cm) 2.859 % Reduction in Area: 84.8% % Reduction in Volume: 84.8% Epithelialization: Small (1-33%) Tunneling: No Undermining: No Wound Description Classification: Full Thickness Without Exposed Support Structures Wound Margin: Flat and Intact Exudate Amount: Medium Exudate Type: Purulent Exudate Color: yellow, [name], green Foul Odor After Cleansing: No Slough/Fibrino Yes Wound Bed Granulation Amount: Small (1-33%)  Exposed Structure Granulation Quality: Pink, Pale Fascia Exposed: No Necrotic Amount: Large (67-100%) Fat Layer (Subcutaneous Tissue) Exposed: Yes Necrotic Quality: Adherent Slough Tendon Exposed: No Muscle Exposed: No Joint Exposed: No Bone Exposed: No Treatment Notes Wound #17 (Lower Leg) Wound Laterality: Right, Lateral Cleanser Soap and Water Discharge Instruction: May shower and wash wound with dial antibacterial soap and water prior to dressing change. Wound Cleanser Discharge Instruction: Cleanse the wound with wound cleanser prior to applying a clean dressing using gauze sponges, not tissue or cotton balls. Peri-Wound Care Triamcinolone 15 (g) Discharge Instruction: Use triamcinolone 15 (g) as directed Sween Lotion (Moisturizing lotion) Discharge Instruction: Apply moisturizing lotion as directed Topical Mupirocin Ointment Discharge Instruction: Apply Mupirocin (Bactroban) as instructed Primary Dressing PROGENA MATRIX Secondary Dressing ABD Pad, 5x9 Discharge Instruction: Apply over primary dressing as directed. Zetuvit Plus 4x8 in Discharge Instruction: Apply over primary dressing as directed. Secured With Compression Wrap Kerlix Roll 4.5x3.1 (in/yd) Discharge Instruction: Apply Kerlix and Coban compression as directed. Coban Self-Adherent Wrap 4x5 (in/yd) Discharge Instruction: Apply over Kerlix as directed. Compression Stockings Add-Ons Electronic Signature(s) Signed: 11/30/2021 12:33:08 PM By: Sharyn Creamer RN, BSN Entered By: Sharyn Creamer on 11/29/2021 10:22:52 -------------------------------------------------------------------------------- Wound Assessment Details Patient Name: Date of Service: [name], [name] 11/29/2021 10:15 A M Medical Record Number: [id number] Patient Account Number: [id number] Date of Birth/Sex: Treating RN: November 12, 1970 (51 y.o. [name] Primary Care Renlee Floor: [name] Other Clinician: Referring  Salene Mohamud: Treating Kenijah Benningfield/Extender: Elyse Jarvis Weeks in Treatment: 32 Wound Status Wound Number: 21 Primary Etiology: Sickle Cell Lesion Wound Location: Right, Medial Ankle Secondary Etiology: Venous Leg Ulcer Wounding Event: Gradually Appeared Wound Status: Open Date Acquired: 06/26/2021  Comorbid History: Anemia, Sickle Cell Disease, Neuropathy Weeks Of Treatment: 22 Clustered Wound: No Photos Wound Measurements Length: (cm) 5.6 Width: (cm) 2.5 Depth: (cm) 0.1 Area: (cm) 10.996 Volume: (cm) 1.1 % Reduction in Area: 62.1% % Reduction in Volume: 62% Epithelialization: Small (1-33%) Tunneling: No Undermining: No Wound Description Classification: Full Thickness Without Exposed Support Structures Wound Margin: Distinct, outline attached Exudate Amount: Medium Exudate Type: Purulent Exudate Color: yellow, [name], green Foul Odor After Cleansing: No Slough/Fibrino Yes Wound Bed Granulation Amount: Small (1-33%) Exposed Structure Granulation Quality: Pink Fascia Exposed: No Necrotic Amount: Large (67-100%) Fat Layer (Subcutaneous Tissue) Exposed: Yes Necrotic Quality: Adherent Slough Tendon Exposed: No Muscle Exposed: No Joint Exposed: No Bone Exposed: No Treatment Notes Wound #21 (Ankle) Wound Laterality: Right, Medial Cleanser Soap and Water Discharge Instruction: May shower and wash wound with dial antibacterial soap and water prior to dressing change. Wound Cleanser Discharge Instruction: Cleanse the wound with wound cleanser prior to applying a clean dressing using gauze sponges, not tissue or cotton balls. Peri-Wound Care Triamcinolone 15 (g) Discharge Instruction: Use triamcinolone 15 (g) as directed Sween Lotion (Moisturizing lotion) Discharge Instruction: Apply moisturizing lotion as directed Topical Mupirocin Ointment Discharge Instruction: Apply Mupirocin (Bactroban) as instructed Primary Dressing PROGENA MATRIX Secondary  Dressing ABD Pad, 5x9 Discharge Instruction: Apply over primary dressing as directed. Zetuvit Plus 4x8 in Discharge Instruction: Apply over primary dressing as directed. Secured With Compression Wrap Kerlix Roll 4.5x3.1 (in/yd) Discharge Instruction: Apply Kerlix and Coban compression as directed. Coban Self-Adherent Wrap 4x5 (in/yd) Discharge Instruction: Apply over Kerlix as directed. Compression Stockings Add-Ons Electronic Signature(s) Signed: 11/30/2021 12:33:08 PM By: Sharyn Creamer RN, BSN Entered By: Sharyn Creamer on 11/29/2021 10:23:28 -------------------------------------------------------------------------------- Wound Assessment Details Patient Name: Date of Service: [name], [name] 11/29/2021 10:15 A M Medical Record Number: [id number] Patient Account Number: [id number] Date of Birth/Sex: Treating RN: January 31, 1971 (51 y.o. [name] Primary Care Caitlen Worth: [name] Other Clinician: Referring Jilliane Kazanjian: Treating Hydee Fleece/Extender: Elyse Jarvis Weeks in Treatment: 32 Wound Status Wound Number: 22 Primary Etiology: Lesion Wound Location: Left, Medial Ankle Wound Status: Open Wounding Event: Gradually Appeared Comorbid History: Anemia, Sickle Cell Disease, Neuropathy Date Acquired: 11/29/2021 Weeks Of Treatment: 0 Clustered Wound: No Photos Wound Measurements Length: (cm) 0.1 Width: (cm) 0.1 Depth: (cm) 0.1 Area: (cm) 0.008 Volume: (cm) 0.001 % Reduction in Area: 0% % Reduction in Volume: 0% Epithelialization: Small (1-33%) Tunneling: No Undermining: No Wound Description Classification: Full Thickness Without Exposed Support Structures Exudate Amount: Medium Exudate Type: Serosanguineous Exudate Color: red, [name] Foul Odor After Cleansing: No Slough/Fibrino Yes Wound Bed Granulation Amount: Small (1-33%) Exposed Structure Granulation Quality: Pink Fascia Exposed: No Necrotic Amount: Large (67-100%) Fat Layer  (Subcutaneous Tissue) Exposed: Yes Necrotic Quality: Adherent Slough Tendon Exposed: No Muscle Exposed: No Joint Exposed: No Bone Exposed: No Treatment Notes Wound #22 (Ankle) Wound Laterality: Left, Medial Cleanser Peri-Wound Care Topical Primary Dressing Secondary Dressing Secured With Compression Wrap Compression Stockings Add-Ons Electronic Signature(s) Signed: 11/29/2021 5:30:20 PM By: Dellie Catholic RN Entered By: Dellie Catholic on 11/29/2021 10:56:40 -------------------------------------------------------------------------------- Vitals Details Patient Name: Date of Service: [name], [name] 11/29/2021 10:15 A M Medical Record Number: [id number] Patient Account Number: [id number] Date of Birth/Sex: Treating RN: 31-Jul-1970 (51 y.o. [name] Primary Care Li Fragoso: [name] Other Clinician: Referring Atwell Mcdanel: Treating Damir Leung/Extender: Elyse Jarvis Weeks in Treatment: 32 Vital Signs Time Taken: 10:08 Temperature (F): 98.6 Height (in): 67 Pulse (bpm): 88 Respiratory Rate (breaths/min): 16 Blood Pressure (  mmHg): 117/65 Reference Range: 80 - 120 mg / dl Electronic Signature(s) Signed: 11/30/2021 12:33:08 PM By: Sharyn Creamer RN, BSN Entered By: Sharyn Creamer on 11/29/2021 10:08:45

## 2021-12-03 NOTE — Progress Notes (Signed)
TAKEELA, PEIL ([id number]) Visit Report for 11/29/2021 Chief Complaint Document Details Patient Name: Date of Service: [name] 11/29/2021 10:15 A M Medical Record Number: [id number] Patient Account Number: [id number] Date of Birth/Sex: Treating RN: 12/31/1970 (51 y.o. [name] Primary Care Provider: [name] Other Clinician: Referring Provider: Treating Provider/Extender: Elyse Jarvis Weeks in Treatment: 58 Information Obtained from: Patient Chief Complaint the patient is here for evaluation of her bilateral lower extremity sickle cell ulcers 04/17/2021; patient comes in for substantial wounds on the right and left lower leg Electronic Signature(s) Signed: 11/29/2021 10:52:19 AM By: Fredirick Maudlin MD FACS Entered By: Fredirick Maudlin on 11/29/2021 10:52:19 -------------------------------------------------------------------------------- Cellular or Tissue Based Product Details Patient Name: Date of Service: [name], [name] 11/29/2021 10:15 A M Medical Record Number: [id number] Patient Account Number: [id number] Date of Birth/Sex: Treating RN: 29-May-1970 (51 y.o. [name] Primary Care Provider: [name] Other Clinician: Referring Provider: Treating Provider/Extender: Elyse Jarvis Weeks in Treatment: 32 Cellular or Tissue Based Product Type Wound #17 Right,Lateral Lower Leg Applied to: Performed By: Physician Fredirick Maudlin, MD Cellular or Tissue Based Product Type: Other Level of Consciousness (Pre-procedure): Awake and Alert Pre-procedure Verification/Time Out Yes - 10:40 Taken: Location: trunk / arms / legs Wound Size (sq cm): 36.4 Product Size (sq cm): 36 Waste Size (sq cm): 0 Amount of Product Applied (sq cm): 36 Instrument Used: Forceps, Scissors Lot #: [id number] Order #: 4 Expiration Date: 03/23/2024 Fenestrated: No Reconstituted: No Secured: Yes Secured With:  Steri-Strips Dressing Applied: Yes Primary Dressing: Adaptic;gauze Procedural Pain: 0 Post Procedural Pain: 0 Response to Treatment: Procedure was tolerated well Level of Consciousness (Post- Awake and Alert procedure): Post Procedure Diagnosis Same as Pre-procedure Notes PROGENA MATRIX ****** TRIAL****** Electronic Signature(s) Signed: 11/29/2021 5:30:20 PM By: Dellie Catholic RN Signed: 12/03/2021 9:51:33 AM By: Fredirick Maudlin MD FACS Entered By: Dellie Catholic on 11/29/2021 17:19:45 -------------------------------------------------------------------------------- Cellular or Tissue Based Product Details Patient Name: Date of Service: [name], [name] 11/29/2021 10:15 A M Medical Record Number: [id number] Patient Account Number: [id number] Date of Birth/Sex: Treating RN: 05-Apr-1971 (51 y.o. [name] Primary Care Provider: [name] Other Clinician: Referring Provider: Treating Provider/Extender: Elyse Jarvis Weeks in Treatment: 32 Cellular or Tissue Based Product Type Wound #21 Right,Medial Ankle Applied to: Performed By: Physician Fredirick Maudlin, MD Cellular or Tissue Based Product Type: Other Level of Consciousness (Pre-procedure): Awake and Alert Pre-procedure Verification/Time Out Yes - 10:40 Taken: Location: trunk / arms / legs Wound Size (sq cm): 14 Product Size (sq cm): 36 Waste Size (sq cm): 0 Amount of Product Applied (sq cm): 36 Instrument Used: Forceps, Scissors Lot #: [id number] Order #: 4 Expiration Date: 03/23/2024 Fenestrated: No Reconstituted: No Secured: Yes Secured With: Steri-Strips Dressing Applied: Yes Primary Dressing: Adaptic;gauze Procedural Pain: 0 Post Procedural Pain: 0 Response to Treatment: Procedure was tolerated well Level of Consciousness (Post- Awake and Alert procedure): Post Procedure Diagnosis Same as Pre-procedure Notes Progena Matrix **** TRIAL**** Electronic  Signature(s) Signed: 11/29/2021 5:30:20 PM By: Dellie Catholic RN Signed: 12/03/2021 9:51:33 AM By: Fredirick Maudlin MD FACS Entered By: Dellie Catholic on 11/29/2021 17:20:53 -------------------------------------------------------------------------------- Debridement Details Patient Name: Date of Service: [name], [name] 11/29/2021 10:15 A M Medical Record Number: [id number] Patient Account Number: [id number] Date of Birth/Sex: Treating RN: 26-Feb-1971 (51 y.o. [name] Primary Care Provider: [name] Other Clinician: Referring Provider: Treating Provider/Extender: Elyse Jarvis Weeks in Treatment: 32 Debridement  Performed for Assessment: Wound #17 Right,Lateral Lower Leg Performed By: Physician Fredirick Maudlin, MD Debridement Type: Debridement Level of Consciousness (Pre-procedure): Awake and Alert Pre-procedure Verification/Time Out Yes - 10:40 Taken: Start Time: 10:40 Pain Control: Lidocaine 4% T opical Solution T Area Debrided (L x W): otal 7 (cm) x 5.2 (cm) = 36.4 (cm) Tissue and other material debrided: Non-Viable, Slough, Slough Level: Non-Viable Tissue Debridement Description: Selective/Open Wound Instrument: Curette Bleeding: Minimum Hemostasis Achieved: Pressure End Time: 10:42 Procedural Pain: 0 Post Procedural Pain: 0 Response to Treatment: Procedure was tolerated well Level of Consciousness (Post- Awake and Alert procedure): Post Debridement Measurements of Total Wound Length: (cm) 7 Width: (cm) 5.2 Depth: (cm) 0.1 Volume: (cm) 2.859 Character of Wound/Ulcer Post Debridement: Improved Post Procedure Diagnosis Same as Pre-procedure Electronic Signature(s) Signed: 11/29/2021 11:42:01 AM By: Fredirick Maudlin MD FACS Signed: 11/29/2021 5:30:20 PM By: Dellie Catholic RN Entered By: Dellie Catholic on 11/29/2021 10:51:11 -------------------------------------------------------------------------------- Debridement  Details Patient Name: Date of Service: [name], [name] 11/29/2021 10:15 A M Medical Record Number: [id number] Patient Account Number: [id number] Date of Birth/Sex: Treating RN: 1970/10/14 (51 y.o. [name] Primary Care Provider: [name] Other Clinician: Referring Provider: Treating Provider/Extender: Elyse Jarvis Weeks in Treatment: 32 Debridement Performed for Assessment: Wound #21 Right,Medial Ankle Performed By: Physician Fredirick Maudlin, MD Debridement Type: Debridement Severity of Tissue Pre Debridement: Fat layer exposed Level of Consciousness (Pre-procedure): Awake and Alert Pre-procedure Verification/Time Out Yes - 10:40 Taken: Start Time: 10:40 Pain Control: Lidocaine 4% T opical Solution T Area Debrided (L x W): otal 5.6 (cm) x 2.5 (cm) = 14 (cm) Tissue and other material debrided: Non-Viable, Slough, Slough Level: Non-Viable Tissue Debridement Description: Selective/Open Wound Instrument: Curette Bleeding: Minimum Hemostasis Achieved: Pressure End Time: 10:42 Procedural Pain: 0 Post Procedural Pain: 0 Response to Treatment: Procedure was tolerated well Level of Consciousness (Post- Awake and Alert procedure): Post Debridement Measurements of Total Wound Length: (cm) 5.6 Width: (cm) 2.5 Depth: (cm) 0.1 Volume: (cm) 1.1 Character of Wound/Ulcer Post Debridement: Improved Severity of Tissue Post Debridement: Fat layer exposed Post Procedure Diagnosis Same as Pre-procedure Electronic Signature(s) Signed: 11/29/2021 11:42:01 AM By: Fredirick Maudlin MD FACS Signed: 11/29/2021 5:30:20 PM By: Dellie Catholic RN Entered By: Dellie Catholic on 11/29/2021 10:52:54 -------------------------------------------------------------------------------- HPI Details Patient Name: Date of Service: [name], [name] 11/29/2021 10:15 A M Medical Record Number: [id number] Patient Account Number: [id number] Date of Birth/Sex: Treating  RN: August 28, 1970 (51 y.o. [name] Primary Care Provider: [name] Other Clinician: Referring Provider: Treating Provider/Extender: Elyse Jarvis Weeks in Treatment: 32 History of Present Illness Location: medial and lateral ankle region on the right and left medial malleolus Quality: Patient reports experiencing a shooting pain to affected area(s). Severity: Patient states wound(s) are getting worse. Duration: right lower extremity bimalleolar ulcers have been present for approximately 2 years; the rright meedial malleolus ulcer has been there proximally 6 months Timing: Pain in wound is constant (hurts all the time) Context: The wound would happen gradually ssociated Signs and Symptoms: Patient reports having increase discharge. A HPI Description: 51 year old patient with a history of sickle cell anemia who was last seen by me with ulceration of the right lower extremity above the ankle and was referred to Dr. Leland Johns for a surgical debridement as I was unable to do anything in the office due to excruciating pain. At that stage she was referred from the plastic surgery service to dermatology who treated her for a skin  infection with doxycycline and then Levaquin and a local antibiotic ointment. I understand the patient has since developed ulceration on the left ankle both medial and lateral and was now referred back to the wound center as dermatology has finished the management. I do not have any notes from the dermatology department Old notes: 51 year old patient with a history of sickle cell anemia, pain bilateral lower extremities, right lower extremity ulcer and has a history of receiving a skin graft( Theraskin) several months ago. She has been visiting the wound center [facility] and was seen by Dr. Dellia Nims and Dr. Leland Johns. after prolonged conservator therapy between July 2016 and January 2017. She had been seen by the plastic surgeon and taken to  the OR for debridement and application of Theraskin. She had 3 applications of Theraskin and was then treated with collagen. Prior to that she had a history of similar problems in 2014 and was treated conservatively. Had a reflux study done for the right lower extremity in August 2016 without reflux or DVT . Past medical history significant for sickle cell disease, anemia, leg ulcers, cholelithiasis,and has never been a smoker. Once the patient was discharged on the wound center she says within 2 or 3 weeks the problems recurred and she has been treating it conservatively. since I saw her 3 weeks ago at [facility] she has been unable to get her dressing material but has completed a course of doxycycline. 6/7/ 2017 -- lower extremity venous duplex reflux evaluation was done No evidence of SVT or DVT in the RLL. No venous incompetence in the RLL. No further vascular workup is indicated at this time. She was seen by Dr. [name], on 10/04/2015. She agreed with the plan of taking her to the OR for debridement and application of theraskin and would also take biopsies to rule out pyoderma gangrenosum. Follow-up note dated May 31 received and she was status post application of Theraskin to multiple ulcers around the right ankle. Pathology did not show evidence of malignancy or pyoderma gangrenosum. She would continue to see as in the wound clinic for further care and see Dr. Leland Johns as needed. The patient brought the biopsy report and it was consistent with stasis ulcer no evidence of malignancy and the comment was that there was some adjacent neovascularization, fibrosis and patchy perivascular chronic inflammation. 11/15/2015 -- today we applied her first application of Theraskin 11/30/15; TheraSkin #2 12/13/2015 -- she is having a lot of pain locally and is here for possible application of a theraskin today. 01/16/2016 -- the patient has significant pain and has noticed despite in spite of all  local care and oral pain medication. It is impossible to debride her in the office. 02/06/2016 -- I do not see any notes from Dr. Iran Planas( the patient has not made a call to the office know as she heard from them) and the only visit to recently was with her PCP Dr. Danella Penton -- I saw her on 01/16/2016 and prescribed 90 tablets of oxycodone 10 mg and did lab work and screening for HIV. the HIV was negative and hemoglobin was 6.3 with a WBC count of 14.9 and hematocrit of 17.8 with platelets of 561. 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 The bimalleolar ulcers to the right lower extremity have been present for approximately 2 years, the left medial malleolus ulcer has been present approximately 6 months. She has followed  with Dr.Thimmappa in the past and has had a total of 3 applications of Theraskin (01/2015, 09/2015, 06/17/16). She has also followed with Dr. Con Memos here in the clinic and has received 2 applications of TheraSkin (11/10/15, 11/30/15). The patient does experience chronic, and is not amenable to debridement. 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 She is not currently on any antibiotic therapy and has not been treated with any recently. 07/17/2016 -- was seen by Dr. Iran Planas of plastic surgery who saw her 2 weeks postop application of Theraskin #3. She had removed her dressing and asked her to apply silver alginate on alternate days and follow-up back with the wound center. Future debridements and application of skin substitute would have to be done in the hospital due to her high risk for anesthesia. READMISSION 04/17/2021 Patient is now a 51 year old woman that we have had in this clinic for a prolonged period of time and 2016-2017 and then again for 2 visits in February 2018. At that point she had wounds on the right  lower leg predominantly medial. She had also been seen by plastic surgery Dr. Leland Johns who I believe took her to the OR for operative debridement and application of TheraSkin in 2017. After she left our clinic she was followed for a very prolonged period of time in the wound care center in [facility] who then referred her ultimately to [facility] where she was seen by Dr. Vernona Rieger. Again taken her to the OR for skin grafting which apparently did not take. She had multiple other attempts at dressings although I have not really looked over all of these notes in great detail. She has not been seen in a wound care center in about a year. She states over the last year in addition to her right lower leg she has developed wounds on the left lower leg quite extensive. She is using Xeroform to all of these wounds without really any improvement. She also has Medicaid which does not cover wound products. The patient has had vascular work-ups in the past including most recently on 03/28/2021 showing biphasic waveforms on the right triphasic at the PTA and biphasic at the dorsalis pedis on the left. She was unable to tolerate any degree of compression to do ABIs. Unfortunately TBI's were also not done. She had venous reflux studies done in 2017. This did not show any evidence of a DVT or SVT and no venous incompetence was noted in the right leg at the time this was the only side with the wound As noted I did not look all over her old records. She apparently had a course of HBO and [facility] although I am not sure what the indication would have been. In any case she developed seizures and terminated treatment earlier. She is generally much more disabled than when we last saw her in clinic. She can no longer walk pretty much wheelchair-bound because predominantly of pain in the left hip. 04/24/2021; the patient tolerated the wraps we put on. We used Santyl and Hydrofera Blue under compression. I brought her back for a nurse  visit for a change in dressing. With Medicaid we will have a hard time getting anything paid for and hence the need for compression. She arrives in clinic with all the wounds looking somewhat better in terms of surface 12/20; circumferential wound on the right from the lateral to the medial. She has open areas on the left medial and left lateral  x2 on all of this with the same surface. This does not look completely healthy although she does have some epithelialization. She is not complaining of a lot of pain which is unusual for her sickle ulcers. I have not looked over her extensive records from [facility]. She had recent arterial studies and has a history of venous reflux studies I will need to look these over although I do not believe she has significant arterial disease 2023 05/22/2021; patient's wound areas measure slightly smaller. Still a lot of drainage coming from the right we have been using Hydrofera Blue and Santyl with some improvement in the wound surfaces. She tells me she will be getting transfused later in the week for her underlying sickle cell anemia I have looked over her recent arterial studies which were done in the fall. This was in November and showed biphasic and triphasic waveforms but she could not tolerate ABIs because of pressure and unfortunately TBI's were not done. She has not had recent venous reflux studies that I can see 1/10; not much change about the same surface area. This has a yellowish surface to it very gritty. We have been using Santyl and Hydrofera Blue for a prolonged period. Culture I did last week showed methicillin sensitive staph aureus "rare". Our intake nurse reports greenish drainage which may be the Hydrofera Blue itself 1/17; wounds are continue to measure smaller although I am not sure about the accuracy here. Especially the areas on the right are covered in what looks to be a nonviable surface although she does have some epithelialization. Similarly  she has areas on the left medial and left lateral ankle area which appear to have a better surface and perhaps are slightly smaller. We have been using Santyl and Hydrofera Blue. She cannot tolerate mechanical debridements She went for her reflux studies which showed significant reflux at the greater saphenous vein at the saphenofemoral junction as well as the greater saphenous vein in the proximal calf on the left she had reflux in the thigh and the common femoral vein and supra vein Fishel vein reflux in the greater saphenous vein. I will have vein and vascular look at this. My thoughts have been that these are likely sickle wounds. I looked through her old records from [facility] wound care center and then when she graduated to [facility] wound care center where she saw Dr. Zigmund Daniel and Dr. Vernona Rieger. Although I can see she had reflux studies done I do not see that she actually saw a vein and vascular. I went over the fact that she had operative debridements and actual skin grafting that did not take. I do not think these wounds have ever really progressed towards healing 1/31;Substantial wounds on the right ankle area. Hyper granulated very gritty adherent debris on the surface. 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 On the left she has no DVT or SVT deep vein reflux in the common femoral vein. Superficial vein reflux in the greater saphenous vein on although the vein diameter is not really all that large. I do not think there is anything that can be done  with these although I am going to send her for consultation to vein and  vascular. 2/7; Wound exam; substantial wound area on the right posterior ankle area and areas on the left medial ankle and left lateral ankle. I was able to debride the left medial ankle last week fairly aggressively and it is back this week to a completely nonviable surface She will see vascular surgery this Friday and I would like them to review the venous studies and also any comments on her arterial status. If they do not see an issue here I am going to refer her to plastic surgery for an operative debridement perhaps intraoperative ACell or Integra. Eventually she will require a deep tissue culture again 2/14; substantial wound area on the right posterior ankle, medial ankle. We have been using silver alginate The patient was seen by vein and vascular she had both venous reflux studies and arterial studies. 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 She had no evidence of superficial venous thrombosis. She did have superficial vein reflux at the saphenofemoral junction and the greater saphenous vein. On the left no evidence of a DVT no evidence of superficial venous throat thrombosis she did have deep vein reflux in the common femoral vein and superficial vein reflux in the greater saphenous vein but these were not felt to be amenable to ablation. In terms of arterial studies she had triphasic and wife biphasic wave waveforms bilaterally not felt to have a significant arterial issue. I do not get the feeling that they felt that any part of her nonhealing wounds were related to either arterial or venous issues. They did note that she had venous reflux at the right at the [facility] and [facility]. And also on the left there were reflux in the deep system at the common femoral vein and greater saphenous vein in the proximal thigh. Nothing amenable to ablation. 2/20; she is making some decent progress on the right where there is nice skin  between the 2 open areas on the right ankle. The surfaces here do not look viable yet there is some surrounding epithelialization. She still has a small area on the left medial ankle area. Hyper-granulated Jody's away always 2/28 patient has an appointment with plastic surgery on 3/8. We will see her back on 3/9. She may have to call us to get the area redressed. We've been using Santyl under silver alginate. We made a nice improvement on the left medial ankle. The larger wounds on the right also looks somewhat better in terms of epithelialization although I think they could benefit from an aggressive debridement if plastic surgery would be willing to do that. Perhaps placement of Integra or a cell 07/26/2021: She saw Dr. Claudia Desanctis yesterday. He raised the question as to whether or not this might be pyoderma and wanted to wait until that question was answered by dermatology before proceeding with any sort of operative debridement. We have continue to use Santyl under silver alginate with Kerlix and Coban wraps. Overall, her wounds appear to be continuing to contract and epithelialize, with some granulation tissue present. There continues to be some slough on all wound surfaces. 08/09/2021: She has not been able to get an appointment with dermatology because apparently the offices in [place] do not accept Medicaid. She is looking into whether or not she can be seen at the main [facility] dermatology clinic.  This is necessary because plastic surgery is concerned that her wounds might represent pyoderma and they did not want to do any procedure until that was clarified. We have been using Santyl under silver alginate with Kerlix and Coban wraps. Today, there was a greater amount of drainage on her dressings with a slight green discoloration and significant odor. Despite this, her wounds continue to contract and epithelialize. There is pale granulation tissue present and actually, on the  left medial ankle, the granulation tissue is a bit hypertrophic. 08/16/2021: Last week, I took a culture and this grew back rare methicillin-resistant Staph aureus and rare corynebacterium. The MRSA was sensitive to gentamicin which we began applying topically on an empiric basis. This week, her wounds are a bit smaller and the drainage and odor are less. Her primary care provider is working on assisting the patient with a dermatology evaluation. She has been in silver alginate over the gentamicin that was started last week along with Kerlix and Coban wraps. 08/23/2021: Because she has Medicaid, we have been unable to get her into see any dermatologist in the [place] to rule out pyoderma gangrenosum, which was a requirement from plastic surgery prior to any sort of debridement and grafting. Despite this, however, all of her wounds continue to get smaller. The wound on her left medial ankle is nearly closed. There is no odor from the wounds, although she still accumulates a modest amount of drainage on her dressings. 08/30/2021: The lateral right ankle wound and the medial left ankle wound are a bit smaller today. The medial right ankle wound is about the same size. They are less tender. We have still been unable to get her into dermatology. 09/06/2021: All of the wounds are about the same size today. She continues to endorse minimal pain. I communicated with Dr. Claudia Desanctis in plastic surgery regarding our issues getting a dermatology appointment; he was out of town but indicated that he would look into perhaps performing the biopsy in his office and will have his office contact her. 09/14/2021: The patient has an appointment in dermatology, but it is not until October. Her wounds are roughly the same; she continues to have very thick purulent-looking drainage on her dressings. 09/20/2021: The left medial wound is nearly closed and just has a bit of accumulated eschar on the surface. The right medial and lateral  ankle wounds are perhaps a little bit smaller. They continue to have a very pale surface with accumulation of thin slough. 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 I did not think [employer] was indicated based on this. She is getting topical mupirocin with Prisma silver collagen. 10/04/2021: The patient was not seen in clinic last week due to childcare coverage issues. In the interim, the left medial leg wound has closed. The right sided leg wounds are smaller. There is more granulation tissue coming through, particularly on the lateral wound. The surface remains somewhat gritty. We have been applying topical mupirocin and Prisma silver collagen. 10/11/2021: The left medial leg wound remains closed. She does complain of some anesthetic sensation to the area. Both of the right-sided leg wounds are smaller but still have accumulated slough. 10/18/2021: Both right-sided leg wounds are minimally smaller this week. She still continues to accumulate slough and has thick drainage on her dressings. 10/23/2021: Both wounds continue to contract. There is still slough buildup. She has been approved for a keratin-based skin substitute trial product but it will not be available until next  week. 10/30/2021: The wounds are about the same to perhaps slightly smaller. There is still continued slough buildup. Unfortunately, the rep for the keratin based product did not show up today and did not answer his phone when called. 11/08/2021: The wounds are little bit smaller today. She continues to have thick drainage but the surfaces are relatively clean with just a little bit of slough accumulation. She reported to me today that she is unable to completely flex her left ankle and on examination it seems this is potentially related to scar tissue from her wounds. We do have the ProgenaMatrix trial product available for her today. 11/15/2021: Both wounds are smaller today. There is some slough accumulation  on the surfaces, but the medial wound, in particular looks like it is filling in and is less deep. She did hear from physical therapy and she is going to start working with them on July 11. She is here for her second application of the trial skin substitute, ProgenaMatrix. 11/22/2021: Both wounds continue to contract, the medial more dramatically than the lateral. Both wounds have a layer of slough on the surface, but underneath this, the gritty fibrous tissue has a little bit more of a pink cast to it rather than being as pale as it has been. 11/29/2021: The wounds are roughly the same size this week, perhaps a millimeter or 2 smaller. The medial wound has filled in and is nearly flush with the surrounding skin surface. She continues to have a lot of slough accumulation on both surfaces. Electronic Signature(s) Signed: 11/29/2021 10:57:03 AM By: Fredirick Maudlin MD FACS Previous Signature: 11/29/2021 10:53:34 AM Version By: Fredirick Maudlin MD FACS Entered By: Fredirick Maudlin on 11/29/2021 10:57:03 -------------------------------------------------------------------------------- Physical Exam Details Patient Name: Date of Service: [name], [name] 11/29/2021 10:15 A M Medical Record Number: [id number] Patient Account Number: [id number] Date of Birth/Sex: Treating RN: Oct 14, 1970 (51 y.o. [name] Primary Care Provider: [name] Other Clinician: Referring Provider: Treating Provider/Extender: Elyse Jarvis Weeks in Treatment: 32 Constitutional . . . . No acute distress.Marland Kitchen Respiratory Normal work of breathing on room air.. Notes 11/29/2021: The wounds are roughly the same size this week, perhaps a millimeter or 2 smaller. The medial wound has filled in and is nearly flush with the surrounding skin surface. She continues to have a lot of slough accumulation on both surfaces. The left medial ankle ulcer appears to have opened slightly as there is some crusting on  the skin. Underneath, there is a pinpoint opening. Electronic Signature(s) Signed: 11/29/2021 11:16:52 AM By: Fredirick Maudlin MD FACS Previous Signature: 11/29/2021 10:59:48 AM Version By: Fredirick Maudlin MD FACS Previous Signature: 11/29/2021 10:58:24 AM Version By: Fredirick Maudlin MD FACS Entered By: Fredirick Maudlin on 11/29/2021 11:16:52 -------------------------------------------------------------------------------- Physician Orders Details Patient Name: Date of Service: [name], [name] 11/29/2021 10:15 A M Medical Record Number: [id number] Patient Account Number: [id number] Date of Birth/Sex: Treating RN: 1970/11/14 (51 y.o. [name] Primary Care Provider: [name] Other Clinician: Referring Provider: Treating Provider/Extender: Elyse Jarvis Weeks in Treatment: 30 Verbal / Phone Orders: No Diagnosis Coding ICD-10 Coding Code Description L97.818 Non-pressure chronic ulcer of other part of right lower leg with other specified severity L97.828 Non-pressure chronic ulcer of other part of left lower leg with other specified severity 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 Follow-up Appointments ppointment in 1 week. - Dr. Celine Ahr - Room 3 Thursday 7/20 at 11am Return A Other: - PROGENA MATRIX #4 Cellular or  Tissue Based Products Other Cellular or Tissue Based Products Orders/Instructions: - PROGENA MATRIX#3 Bathing/ Shower/ Hygiene May shower with protection but do not get wound dressing(s) wet. - Can get cast protector bags at [facility] or [facility] Edema Control - Lymphedema / SCD / Other Elevate legs to the level of the heart or above for 30 minutes daily and/or when sitting, a frequency of: - throughout the day Avoid standing for long periods of time. Exercise regularly Additional Orders / Instructions Follow Nutritious Diet Wound Treatment Wound #17 - Lower Leg Wound Laterality: Right, Lateral Cleanser: Soap and Water 1 x Per Week/30  Days Discharge Instructions: May shower and wash wound with dial antibacterial soap and water prior to dressing change. Cleanser: Wound Cleanser 1 x Per Week/30 Days Discharge Instructions: Cleanse the wound with wound cleanser prior to applying a clean dressing using gauze sponges, not tissue or cotton balls. Peri-Wound Care: Triamcinolone 15 (g) 1 x Per Week/30 Days Discharge Instructions: Use triamcinolone 15 (g) as directed Peri-Wound Care: Sween Lotion (Moisturizing lotion) 1 x Per Week/30 Days Discharge Instructions: Apply moisturizing lotion as directed Topical: Mupirocin Ointment 1 x Per Week/30 Days Discharge Instructions: Apply Mupirocin (Bactroban) as instructed Prim Dressing: [place] ary 1 x Per Week/30 Days Secondary Dressing: ABD Pad, 5x9 1 x Per Week/30 Days Discharge Instructions: Apply over primary dressing as directed. Secondary Dressing: Zetuvit Plus 4x8 in 1 x Per Week/30 Days Discharge Instructions: Apply over primary dressing as directed. Compression Wrap: Kerlix Roll 4.5x3.1 (in/yd) 1 x Per Week/30 Days Discharge Instructions: Apply Kerlix and Coban compression as directed. Compression Wrap: Coban Self-Adherent Wrap 4x5 (in/yd) 1 x Per Week/30 Days Discharge Instructions: Apply over Kerlix as directed. Wound #21 - Ankle Wound Laterality: Right, Medial Cleanser: Soap and Water 1 x Per Week/30 Days Discharge Instructions: May shower and wash wound with dial antibacterial soap and water prior to dressing change. Cleanser: Wound Cleanser 1 x Per Week/30 Days Discharge Instructions: Cleanse the wound with wound cleanser prior to applying a clean dressing using gauze sponges, not tissue or cotton balls. Peri-Wound Care: Triamcinolone 15 (g) 1 x Per Week/30 Days Discharge Instructions: Use triamcinolone 15 (g) as directed Peri-Wound Care: Sween Lotion (Moisturizing lotion) 1 x Per Week/30 Days Discharge Instructions: Apply moisturizing lotion as directed Topical:  Mupirocin Ointment 1 x Per Week/30 Days Discharge Instructions: Apply Mupirocin (Bactroban) as instructed Prim Dressing: [place] ary 1 x Per Week/30 Days Secondary Dressing: ABD Pad, 5x9 1 x Per Week/30 Days Discharge Instructions: Apply over primary dressing as directed. Secondary Dressing: Zetuvit Plus 4x8 in 1 x Per Week/30 Days Discharge Instructions: Apply over primary dressing as directed. Compression Wrap: Kerlix Roll 4.5x3.1 (in/yd) 1 x Per Week/30 Days Discharge Instructions: Apply Kerlix and Coban compression as directed. Compression Wrap: Coban Self-Adherent Wrap 4x5 (in/yd) 1 x Per Week/30 Days Discharge Instructions: Apply over Kerlix as directed. Electronic Signature(s) Signed: 11/29/2021 5:30:20 PM By: Dellie Catholic RN Signed: 12/03/2021 9:51:33 AM By: Fredirick Maudlin MD FACS Entered By: Dellie Catholic on 11/29/2021 17:28:34 -------------------------------------------------------------------------------- Problem List Details Patient Name: Date of Service: [name], [name] 11/29/2021 10:15 A M Medical Record Number: [id number] Patient Account Number: [id number] Date of Birth/Sex: Treating RN: [birth year] (51 y.o. [name] Primary Care Provider: [name] Other Clinician: Referring Provider: Treating Provider/Extender: Elyse Jarvis Weeks in Treatment: 32 Active Problems ICD-10 Encounter Code Description Active Date MDM Diagnosis L97.818 Non-pressure chronic ulcer of other part of right lower leg with other specified 04/17/2021 No  Yes severity 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 04/17/2021 No Yes L97.828 Non-pressure chronic ulcer of other part of left lower leg with other specified 04/17/2021 No Yes severity Inactive Problems Resolved Problems Electronic Signature(s) Signed: 11/29/2021 10:59:11 AM By: Fredirick Maudlin MD FACS Previous Signature: 11/29/2021 10:51:58 AM Version By: Fredirick Maudlin MD FACS Entered By:  Fredirick Maudlin on 11/29/2021 10:59:11 -------------------------------------------------------------------------------- Progress Note Details Patient Name: Date of Service: [name], [name] 11/29/2021 10:15 A M Medical Record Number: [id number] Patient Account Number: [id number] Date of Birth/Sex: Treating RN: Oct 16, 1970 (51 y.o. [name] Primary Care Provider: [name] Other Clinician: Referring Provider: Treating Provider/Extender: Elyse Jarvis Weeks in Treatment: 25 Subjective Chief Complaint Information obtained from Patient the patient is here for evaluation of her bilateral lower extremity sickle cell ulcers 04/17/2021; patient comes in for substantial wounds on the right and left lower leg History of Present Illness (HPI) The following HPI elements were documented for the patient's wound: Location: medial and lateral ankle region on the right and left medial malleolus Quality: Patient reports experiencing a shooting pain to affected area(s). Severity: Patient states wound(s) are getting worse. Duration: right lower extremity bimalleolar ulcers have been present for approximately 2 years; the rright meedial malleolus ulcer has been there proximally 6 months Timing: Pain in wound is constant (hurts all the time) Context: The wound would happen gradually Associated Signs and Symptoms: Patient reports having increase discharge. 51 year old patient with a history of sickle cell anemia who was last seen by me with ulceration of the right lower extremity above the ankle and was referred to Dr. Leland Johns for a surgical debridement as I was unable to do anything in the office due to excruciating pain. At that stage she was referred from the plastic surgery service to dermatology who treated her for a skin infection with doxycycline and then Levaquin and a local antibiotic ointment. I understand the patient has since developed ulceration on the left  ankle both medial and lateral and was now referred back to the wound center as dermatology has finished the management. I do not have any notes from the dermatology department Old notes: 51 year old patient with a history of sickle cell anemia, pain bilateral lower extremities, right lower extremity ulcer and has a history of receiving a skin graft( Theraskin) several months ago. She has been visiting the wound center [facility] and was seen by Dr. Dellia Nims and Dr. Leland Johns. after prolonged conservator therapy between July 2016 and January 2017. She had been seen by the plastic surgeon and taken to the OR for debridement and application of Theraskin. She had 3 applications of Theraskin and was then treated with collagen. Prior to that she had a history of similar problems in 2014 and was treated conservatively. Had a reflux study done for the right lower extremity in August 2016 without reflux or DVT . Past medical history significant for sickle cell disease, anemia, leg ulcers, cholelithiasis,and has never been a smoker. Once the patient was discharged on the wound center she says within 2 or 3 weeks the problems recurred and she has been treating it conservatively. since I saw her 3 weeks ago at [facility] she has been unable to get her dressing material but has completed a course of doxycycline. 6/7/ 2017 -- lower extremity venous duplex reflux evaluation was done oo No evidence of SVT or DVT in the RLL. No venous incompetence in the RLL. No further vascular workup is indicated at this time. She was seen  by Dr. [name], on 10/04/2015. She agreed with the plan of taking her to the OR for debridement and application of theraskin and would also take biopsies to rule out pyoderma gangrenosum. Follow-up note dated May 31 received and she was status post application of Theraskin to multiple ulcers around the right ankle. Pathology did not show evidence of malignancy or pyoderma gangrenosum. She  would continue to see as in the wound clinic for further care and see Dr. Leland Johns as needed. The patient brought the biopsy report and it was consistent with stasis ulcer no evidence of malignancy and the comment was that there was some adjacent neovascularization, fibrosis and patchy perivascular chronic inflammation. 11/15/2015 -- today we applied her first application of Theraskin 11/30/15; TheraSkin #2 12/13/2015 -- she is having a lot of pain locally and is here for possible application of a theraskin today. 01/16/2016 -- the patient has significant pain and has noticed despite in spite of all local care and oral pain medication. It is impossible to debride her in the office. 02/06/2016 -- I do not see any notes from Dr. Iran Planas( the patient has not made a call to the office know as she heard from them) and the only visit to recently was with her PCP Dr. Danella Penton -- I saw her on 01/16/2016 and prescribed 90 tablets of oxycodone 10 mg and did lab work and screening for HIV. the HIV was negative and hemoglobin was 6.3 with a WBC count of 14.9 and hematocrit of 17.8 with platelets of 561. 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 The bimalleolar ulcers to the right lower extremity have been present for approximately 2 years, the left medial malleolus ulcer has been present approximately 6 months. She has followed with Dr.Thimmappa in the past and has had a total of 3 applications of Theraskin (01/2015, 09/2015, 06/17/16). She has also followed with Dr. Con Memos here in the clinic and has received 2 applications of TheraSkin (11/10/15, 11/30/15). The patient does experience chronic, and is not amenable to debridement. 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 She is not currently on any antibiotic therapy and has not been treated with any  recently. 07/17/2016 -- was seen by Dr. Iran Planas of plastic surgery who saw her 2 weeks postop application of Theraskin #3. She had removed her dressing and asked her to apply silver alginate on alternate days and follow-up back with the wound center. Future debridements and application of skin substitute would have to be done in the hospital due to her high risk for anesthesia. READMISSION 04/17/2021 Patient is now a 51 year old woman that we have had in this clinic for a prolonged period of time and 2016-2017 and then again for 2 visits in February 2018. At that point she had wounds on the right lower leg predominantly medial. She had also been seen by plastic surgery Dr. Leland Johns who I believe took her to the OR for operative debridement and application of TheraSkin in 2017. After she left our clinic she was followed for a very prolonged period of time in the wound care center in [facility] who then referred her ultimately to [facility] where she was seen by Dr. Vernona Rieger. Again taken her to the OR for skin grafting which apparently did not take. She had multiple other attempts at dressings although I have not really looked over all  of these notes in great detail. She has not been seen in a wound care center in about a year. She states over the last year in addition to her right lower leg she has developed wounds on the left lower leg quite extensive. She is using Xeroform to all of these wounds without really any improvement. She also has Medicaid which does not cover wound products. The patient has had vascular work-ups in the past including most recently on 03/28/2021 showing biphasic waveforms on the right triphasic at the PTA and biphasic at the dorsalis pedis on the left. She was unable to tolerate any degree of compression to do ABIs. Unfortunately TBI's were also not done. She had venous reflux studies done in 2017. This did not show any evidence of a DVT or SVT and no venous incompetence was  noted in the right leg at the time this was the only side with the wound As noted I did not look all over her old records. She apparently had a course of HBO and [facility] although I am not sure what the indication would have been. In any case she developed seizures and terminated treatment earlier. She is generally much more disabled than when we last saw her in clinic. She can no longer walk pretty much wheelchair-bound because predominantly of pain in the left hip. 04/24/2021; the patient tolerated the wraps we put on. We used Santyl and Hydrofera Blue under compression. I brought her back for a nurse visit for a change in dressing. With Medicaid we will have a hard time getting anything paid for and hence the need for compression. She arrives in clinic with all the wounds looking somewhat better in terms of surface 12/20; circumferential wound on the right from the lateral to the medial. She has open areas on the left medial and left lateral x2 on all of this with the same surface. This does not look completely healthy although she does have some epithelialization. She is not complaining of a lot of pain which is unusual for her sickle ulcers. I have not looked over her extensive records from [facility]. She had recent arterial studies and has a history of venous reflux studies I will need to look these over although I do not believe she has significant arterial disease 2023 05/22/2021; patient's wound areas measure slightly smaller. Still a lot of drainage coming from the right we have been using Hydrofera Blue and Santyl with some improvement in the wound surfaces. She tells me she will be getting transfused later in the week for her underlying sickle cell anemia I have looked over her recent arterial studies which were done in the fall. This was in November and showed biphasic and triphasic waveforms but she could not tolerate ABIs because of pressure and unfortunately TBI's were not done. She has  not had recent venous reflux studies that I can see 1/10; not much change about the same surface area. This has a yellowish surface to it very gritty. We have been using Santyl and Hydrofera Blue for a prolonged period. Culture I did last week showed methicillin sensitive staph aureus "rare". Our intake nurse reports greenish drainage which may be the Hydrofera Blue itself 1/17; wounds are continue to measure smaller although I am not sure about the accuracy here. Especially the areas on the right are covered in what looks to be a nonviable surface although she does have some epithelialization. Similarly she has areas on the left medial and left lateral ankle area  which appear to have a better surface and perhaps are slightly smaller. We have been using Santyl and Hydrofera Blue. She cannot tolerate mechanical debridements She went for her reflux studies which showed significant reflux at the greater saphenous vein at the saphenofemoral junction as well as the greater saphenous vein in the proximal calf on the left she had reflux in the thigh and the common femoral vein and supra vein Fishel vein reflux in the greater saphenous vein. I will have vein and vascular look at this. My thoughts have been that these are likely sickle wounds. I looked through her old records from [facility] wound care center and then when she graduated to [facility] wound care center where she saw Dr. Zigmund Daniel and Dr. Vernona Rieger. Although I can see she had reflux studies done I do not see that she actually saw a vein and vascular. I went over the fact that she had operative debridements and actual skin grafting that did not take. I do not think these wounds have ever really progressed towards healing 1/31;Substantial wounds on the right ankle area. Hyper granulated very gritty adherent debris on the surface. 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 On the left she has no DVT or SVT deep vein reflux in the common femoral vein. Superficial vein reflux in the greater saphenous vein on although the vein diameter is not really all that large. I do not think there is anything that can be done with these although I am going to send her for consultation to vein and vascular. 2/7; Wound exam; substantial wound area on the right posterior ankle area and areas on the left medial ankle and left lateral ankle. I was able to debride the left medial ankle last week fairly aggressively and it is back this week to a completely nonviable surface She will see vascular surgery this Friday and I would like them to review the venous studies and also any comments on her arterial status. If they do not see an issue here I am going to refer her to plastic surgery for an operative debridement perhaps intraoperative ACell or Integra. Eventually she will require a deep tissue culture again 2/14; substantial wound area on the right posterior ankle, medial ankle. We have been using silver alginate The patient was seen by vein and vascular she had both venous reflux studies and arterial studies. 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 She had no evidence of superficial venous thrombosis. She did have superficial vein reflux at the saphenofemoral junction and the greater saphenous vein. On the left no evidence of a DVT no evidence of superficial venous throat thrombosis she did have deep vein reflux in the common femoral vein and superficial vein reflux in the greater saphenous vein but these were not felt to be  amenable to ablation. In terms of arterial studies she had triphasic and wife biphasic wave waveforms bilaterally not felt to have a significant arterial issue. I do not get the feeling that they felt that any part of  her nonhealing wounds were related to either arterial or venous issues. They did note that she had venous reflux at the right at the [facility] and [facility]. And also on the left there were reflux in the deep system at the common femoral vein and greater saphenous vein in the proximal thigh. Nothing amenable to ablation. 2/20; she is making some decent progress on the right where there is nice skin between the 2 open areas on the right ankle. The surfaces here do not look viable yet there is some surrounding epithelialization. She still has a small area on the left medial ankle area. Hyper-granulated Jody's away always 2/28 patient has an appointment with plastic surgery on 3/8. We will see her back on 3/9. She may have to call us to get the area redressed. We've been using Santyl under silver alginate. We made a nice improvement on the left medial ankle. The larger wounds on the right also looks somewhat better in terms of epithelialization although I think they could benefit from an aggressive debridement if plastic surgery would be willing to do that. Perhaps placement of Integra or a cell 07/26/2021: She saw Dr. Claudia Desanctis yesterday. He raised the question as to whether or not this might be pyoderma and wanted to wait until that question was answered by dermatology before proceeding with any sort of operative debridement. We have continue to use Santyl under silver alginate with Kerlix and Coban wraps. Overall, her wounds appear to be continuing to contract and epithelialize, with some granulation tissue present. There continues to be some slough on all wound surfaces. 08/09/2021: She has not been able to get an appointment with dermatology because apparently the offices in [place] do  not accept Medicaid. She is looking into whether or not she can be seen at the main [facility] dermatology clinic. This is necessary because plastic surgery is concerned that her wounds might represent pyoderma and they did not want to do any procedure until that was clarified. We have been using Santyl under silver alginate with Kerlix and Coban wraps. Today, there was a greater amount of drainage on her dressings with a slight green discoloration and significant odor. Despite this, her wounds continue to contract and epithelialize. There is pale granulation tissue present and actually, on the left medial ankle, the granulation tissue is a bit hypertrophic. 08/16/2021: Last week, I took a culture and this grew back rare methicillin-resistant Staph aureus and rare corynebacterium. The MRSA was sensitive to gentamicin which we began applying topically on an empiric basis. This week, her wounds are a bit smaller and the drainage and odor are less. Her primary care provider is working on assisting the patient with a dermatology evaluation. She has been in silver alginate over the gentamicin that was started last week along with Kerlix and Coban wraps. 08/23/2021: Because she has Medicaid, we have been unable to get her into see any dermatologist in the [place] to rule out pyoderma gangrenosum, which was a requirement from plastic surgery prior to any sort of debridement and grafting. Despite this, however, all of her wounds continue to get smaller. The wound on her left medial ankle is nearly closed. There is no odor from the wounds, although she still accumulates a modest amount of drainage on her dressings. 08/30/2021: The lateral right ankle wound and the medial left ankle wound are a bit smaller today. The medial right ankle wound is about the same size. They are less tender. We have still  been unable to get her into dermatology. 09/06/2021: All of the wounds are about the same size today. She  continues to endorse minimal pain. I communicated with Dr. Claudia Desanctis in plastic surgery regarding our issues getting a dermatology appointment; he was out of town but indicated that he would look into perhaps performing the biopsy in his office and will have his office contact her. 09/14/2021: The patient has an appointment in dermatology, but it is not until October. Her wounds are roughly the same; she continues to have very thick purulent-looking drainage on her dressings. 09/20/2021: The left medial wound is nearly closed and just has a bit of accumulated eschar on the surface. The right medial and lateral ankle wounds are perhaps a little bit smaller. They continue to have a very pale surface with accumulation of thin slough. 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 I did not think [employer] was indicated based on this. She is getting topical mupirocin with Prisma silver collagen. 10/04/2021: The patient was not seen in clinic last week due to childcare coverage issues. In the interim, the left medial leg wound has closed. The right sided leg wounds are smaller. There is more granulation tissue coming through, particularly on the lateral wound. The surface remains somewhat gritty. We have been applying topical mupirocin and Prisma silver collagen. 10/11/2021: The left medial leg wound remains closed. She does complain of some anesthetic sensation to the area. Both of the right-sided leg wounds are smaller but still have accumulated slough. 10/18/2021: Both right-sided leg wounds are minimally smaller this week. She still continues to accumulate slough and has thick drainage on her dressings. 10/23/2021: Both wounds continue to contract. There is still slough buildup. She has been approved for a keratin-based skin substitute trial product but it will not be available until next week. 10/30/2021: The wounds are about the same to perhaps slightly smaller. There is still continued slough  buildup. Unfortunately, the rep for the keratin based product did not show up today and did not answer his phone when called. 11/08/2021: The wounds are little bit smaller today. She continues to have thick drainage but the surfaces are relatively clean with just a little bit of slough accumulation. She reported to me today that she is unable to completely flex her left ankle and on examination it seems this is potentially related to scar tissue from her wounds. We do have the ProgenaMatrix trial product available for her today. 11/15/2021: Both wounds are smaller today. There is some slough accumulation on the surfaces, but the medial wound, in particular looks like it is filling in and is less deep. She did hear from physical therapy and she is going to start working with them on July 11. She is here for her second application of the trial skin substitute, ProgenaMatrix. 11/22/2021: Both wounds continue to contract, the medial more dramatically than the lateral. Both wounds have a layer of slough on the surface, but underneath this, the gritty fibrous tissue has a little bit more of a pink cast to it rather than being as pale as it has been. 11/29/2021: The wounds are roughly the same size this week, perhaps a millimeter or 2 smaller. The medial wound has filled in and is nearly flush with the surrounding skin surface. She continues to have a lot of slough accumulation on both surfaces. Patient History Information obtained from Patient. Family History Diabetes - Mother, Lung Disease - Mother, No family history of Cancer, Heart  Disease, Hereditary Spherocytosis, Hypertension, Kidney Disease, Seizures, Stroke, Thyroid Problems, Tuberculosis. Social History Never smoker, Marital Status - Married, Alcohol Use - Never, Drug Use - No History, Caffeine Use - Daily. Medical History Eyes Denies history of Cataracts, Glaucoma, Optic Neuritis Ear/Nose/Mouth/Throat Denies history of Chronic sinus  problems/congestion, Middle ear problems Hematologic/Lymphatic Patient has history of Anemia, Sickle Cell Disease Denies history of Hemophilia, Human Immunodeficiency Virus, Lymphedema Respiratory Denies history of Aspiration, Asthma, Chronic Obstructive Pulmonary Disease (COPD), Pneumothorax, Sleep Apnea, Tuberculosis Cardiovascular Denies history of Angina, Arrhythmia, Congestive Heart Failure, Coronary Artery Disease, Deep Vein Thrombosis, Hypertension, Hypotension, Myocardial Infarction, Peripheral Arterial Disease, Peripheral Venous Disease, Phlebitis, Vasculitis Gastrointestinal Denies history of Cirrhosis , Colitis, Crohnoos, Hepatitis A, Hepatitis B, Hepatitis C Endocrine Denies history of Type I Diabetes, Type II Diabetes Genitourinary Denies history of End Stage Renal Disease Immunological Denies history of Lupus Erythematosus, Raynaudoos, Scleroderma Integumentary (Skin) Denies history of History of Burn Musculoskeletal Denies history of Gout, Rheumatoid Arthritis, Osteoarthritis, Osteomyelitis Neurologic Patient has history of Neuropathy - right foot intermittant Denies history of Dementia, Quadriplegia, Paraplegia, Seizure Disorder Oncologic Denies history of Received Chemotherapy, Received Radiation Psychiatric Denies history of Anorexia/bulimia, Confinement Anxiety Hospitalization/Surgery History - c section x2. - left breast lumpectomy. - iandD right ankle with theraskin. Medical A Surgical History Notes nd Constitutional Symptoms (General Health) H/O miscarriage Cardiovascular bradycardia Gastrointestinal cholilithiasis Objective Constitutional No acute distress.. Vitals Time Taken: 10:08 AM, Height: 67 in, Temperature: 98.6 F, Pulse: 88 bpm, Respiratory Rate: 16 breaths/min, Blood Pressure: 117/65 mmHg. Respiratory Normal work of breathing on room air.. General Notes: 11/29/2021: The wounds are roughly the same size this week, perhaps a millimeter or 2  smaller. The medial wound has filled in and is nearly flush with the surrounding skin surface. She continues to have a lot of slough accumulation on both surfaces. The left medial ankle ulcer appears to have opened slightly as there is some crusting on the skin. Underneath, there is a pinpoint opening. Integumentary (Hair, Skin) Wound #17 status is Open. Original cause of wound was Gradually Appeared. The date acquired was: 10/05/2012. The wound has been in treatment 32 weeks. The wound is located on the Right,Lateral Lower Leg. The wound measures 7cm length x 5.2cm width x 0.1cm depth; 28.588cm^2 area and 2.859cm^3 volume. There is Fat Layer (Subcutaneous Tissue) exposed. There is no tunneling or undermining noted. There is a medium amount of purulent drainage noted. The wound margin is flat and intact. There is small (1-33%) pink, pale granulation within the wound bed. There is a large (67-100%) amount of necrotic tissue within the wound bed including Adherent Slough. Wound #21 status is Open. Original cause of wound was Gradually Appeared. The date acquired was: 06/26/2021. The wound has been in treatment 22 weeks. The wound is located on the Right,Medial Ankle. The wound measures 5.6cm length x 2.5cm width x 0.1cm depth; 10.996cm^2 area and 1.1cm^3 volume. There is Fat Layer (Subcutaneous Tissue) exposed. There is no tunneling or undermining noted. There is a medium amount of purulent drainage noted. The wound margin is distinct with the outline attached to the wound base. There is small (1-33%) pink granulation within the wound bed. There is a large (67-100%) amount of necrotic tissue within the wound bed including Adherent Slough. Wound #22 status is Open. Original cause of wound was Gradually Appeared. The date acquired was: 11/29/2021. The wound is located on the Left,Medial Ankle. The wound measures 0.1cm length x 0.1cm width x 0.1cm depth; 0.008cm^2 area and 0.001cm^3  volume. There is Fat Layer  (Subcutaneous Tissue) exposed. There is no tunneling or undermining noted. There is a medium amount of serosanguineous drainage noted. There is small (1-33%) pink granulation within the wound bed. There is a large (67-100%) amount of necrotic tissue within the wound bed including Adherent Slough. 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 There was a Selective/Open Wound Non-Viable Tissue Debridement with a total area of 36.4 sq cm performed by Fredirick Maudlin, MD. With the following instrument(s): Curette to remove Non-Viable tissue/material. Material removed includes [facility] after achieving pain control using Lidocaine 4% Topical Solution. No specimens were taken. A time out was conducted at 10:40, prior to the start of the procedure. A Minimum amount of bleeding was controlled with Pressure. The procedure was tolerated well with a pain level of 0 throughout and a pain level of 0 following the procedure. Post Debridement Measurements: 7cm length x 5.2cm width x 0.1cm depth; 2.859cm^3 volume. Character of Wound/Ulcer Post Debridement is improved. Post procedure Diagnosis Wound #17: Same as Pre-Procedure Wound #21 Pre-procedure diagnosis of Wound #21 is a Sickle Cell Lesion located on the Right,Medial Ankle .Severity of Tissue Pre Debridement is: Fat layer exposed. There was a Selective/Open Wound Non-Viable Tissue Debridement with a total area of 14 sq cm performed by Fredirick Maudlin, MD. With the following instrument(s): Curette to remove Non-Viable tissue/material. Material removed includes [facility] after achieving pain control using Lidocaine 4% Topical Solution. No  specimens were taken. A time out was conducted at 10:40, prior to the start of the procedure. A Minimum amount of bleeding was controlled with Pressure. The procedure was tolerated well with a pain level of 0 throughout and a pain level of 0 following the procedure. Post Debridement Measurements: 5.6cm length x 2.5cm width x 0.1cm depth; 1.1cm^3 volume. Character of Wound/Ulcer Post Debridement is improved. Severity of Tissue Post Debridement is: Fat layer exposed. Post procedure Diagnosis Wound #21: Same as Pre-Procedure Plan Silver alginate to the small pinpoint opening in her left ankle 11/29/2021: The wounds are roughly the same size this week, perhaps a millimeter or 2 smaller. The medial wound has filled in and is nearly flush with the surrounding skin surface. She continues to have a lot of slough accumulation on both surfaces. The left medial ankle ulcer appears to have opened slightly as there is some crusting on the skin. Underneath, there is a pinpoint opening. I used a curette to debride slough off of both right leg ulcers. I used forceps to remove the crust and eschar from the left medial ankle ulcer. I applied a trial skin substitute product (ProgenaMatrix, no charge) to both of the right ankle wounds in standard fashion, securing it in place with Adaptic and Steri-Strips, using folded gauze sponges to create a bolster effect. We will apply silver alginate to the pinpoint wound in her left ankle. Follow-up in 1 week. Electronic Signature(s) Signed: 11/29/2021 11:18:47 AM By: Fredirick Maudlin MD FACS Entered By: Fredirick Maudlin on 11/29/2021 11:18:47 -------------------------------------------------------------------------------- HxROS Details Patient Name: Date of Service: [name], [name] 11/29/2021 10:15 A M Medical Record Number: [id number] Patient Account Number: [id number] Date of Birth/Sex: Treating RN: July 08, 1970 (51 y.o. [name] Primary Care Provider: Smith Robert,  NWGNFAO Other Clinician: Referring Provider: Treating Provider/Extender: Elyse Jarvis Weeks in Treatment: 32 Information Obtained From Patient Constitutional Symptoms (General Health) Medical History: Past Medical History Notes: H/O miscarriage Eyes Medical History: Negative for: Cataracts; Glaucoma; Optic Neuritis Ear/Nose/Mouth/Throat Medical History: Negative for: Chronic sinus problems/congestion; Middle ear problems Hematologic/Lymphatic Medical History: Positive for: Anemia; Sickle Cell Disease Negative for: Hemophilia; Human Immunodeficiency Virus; Lymphedema Respiratory Medical History: Negative for: Aspiration; Asthma; Chronic Obstructive Pulmonary Disease (COPD); Pneumothorax; Sleep Apnea; Tuberculosis Cardiovascular Medical History: Negative for: Angina; Arrhythmia; Congestive Heart Failure; Coronary Artery Disease; Deep Vein Thrombosis; Hypertension; Hypotension; Myocardial Infarction; Peripheral Arterial Disease; Peripheral Venous Disease; Phlebitis; Vasculitis Past Medical History Notes: bradycardia Gastrointestinal Medical History: Negative for: Cirrhosis ; Colitis; Crohns; Hepatitis A; Hepatitis B; Hepatitis C Past Medical History Notes: cholilithiasis Endocrine Medical History: Negative for: Type I Diabetes; Type II Diabetes Genitourinary Medical History: Negative for: End Stage Renal Disease Immunological Medical History: Negative for: Lupus Erythematosus; Raynauds; Scleroderma Integumentary (Skin) Medical History: Negative for: History of Burn Musculoskeletal Medical History: Negative for: Gout; Rheumatoid Arthritis; Osteoarthritis; Osteomyelitis Neurologic Medical History: Positive for: Neuropathy - right foot intermittant Negative for: Dementia; Quadriplegia; Paraplegia; Seizure Disorder Oncologic Medical History: Negative for: Received Chemotherapy; Received Radiation Psychiatric Medical History: Negative for:  Anorexia/bulimia; Confinement Anxiety Immunizations Pneumococcal Vaccine: Received Pneumococcal Vaccination: No Implantable Devices None Hospitalization / Surgery History Type of Hospitalization/Surgery c section x2 left breast lumpectomy iandD right ankle with theraskin Family and Social History Cancer: No; Diabetes: Yes - Mother; Heart Disease: No; Hereditary Spherocytosis: No; Hypertension: No; Kidney Disease: No; Lung Disease: Yes - Mother; Seizures: No; Stroke: No; Thyroid Problems: No; Tuberculosis: No; Never smoker; Marital Status - Married; Alcohol Use: Never; Drug Use: No History; Caffeine Use: Daily; Financial Concerns: No; Food, Clothing or Shelter Needs: No; Support System Lacking: No; Transportation Concerns: No Electronic Signature(s) Signed: 11/29/2021 11:42:01 AM By: Fredirick Maudlin MD FACS Signed: 11/29/2021 5:30:20 PM By: Dellie Catholic RN Entered By: Fredirick Maudlin on 11/29/2021 10:58:00 -------------------------------------------------------------------------------- SuperBill Details Patient Name: Date of Service: [name], [name] 11/29/2021 Medical Record Number: [id number] Patient Account Number: [id number] Date of Birth/Sex: Treating RN: 1970-08-16 (51 y.o. [name] Primary Care Provider: [name] Other Clinician: Referring Provider: Treating Provider/Extender: Elyse Jarvis Weeks in Treatment: 32 Diagnosis Coding ICD-10 Codes Code Description [phone] Non-pressure chronic ulcer of other part of right lower leg with other specified severity L97.828 Non-pressure chronic ulcer of other part of left lower leg with other specified severity 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 Facility Procedures CPT4 Code: [id number] I Description: 010 - DEBRIDE WOUND 1ST 20 SQ CM OR < CD-10 Diagnosis Description L97.818 Non-pressure chronic ulcer of other part of right lower leg with other specified L97.828 Non-pressure chronic  ulcer of other part of left lower leg with other  specified s Modifier: severity everity Quantity: 1 CPT4 Code: [id number] 97 I Description: 403 - DEBRIDE WOUND EA ADDL 20 SQ CM CD-10 Diagnosis Description L97.818 Non-pressure chronic ulcer of other part of right lower leg with other specified L97.828 Non-pressure chronic ulcer of other part of left lower leg with other specified  s Modifier: severity everity Quantity: 2 Physician Procedures : CPT4 Code Description Modifier [id number] - WC PHYS LEVEL 3 - EST PT 25 ICD-10 Diagnosis Description L97.818 Non-pressure chronic ulcer of other part of right lower leg with other specified severity L97.828 Non-pressure chronic ulcer of other part  of left lower leg with other specified severity 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  Quantity: 1 : [id number] - WC PHYS DEBR WO ANESTH 20 SQ CM ICD-10 Diagnosis Description L97.818 Non-pressure chronic ulcer of other part of right lower leg with other specified severity L97.828 Non-pressure chronic ulcer of other part of left lower leg with other  specified severity Quantity: 1 : [id number] - WC PHYS DEBR WO ANESTH EA ADD 20 CM ICD-10 Diagnosis Description L97.818 Non-pressure chronic ulcer of other part of right lower leg with other specified severity L97.828 Non-pressure chronic ulcer of other part of left lower leg with  other specified severity Quantity: 2 Electronic Signature(s) Signed: 11/29/2021 11:19:56 AM By: Fredirick Maudlin MD FACS Entered By: Fredirick Maudlin on 11/29/2021 11:19:56

## 2021-12-04 ENCOUNTER — Ambulatory Visit: Payer: Medicaid Other | Admitting: Physical Therapy

## 2021-12-04 ENCOUNTER — Encounter: Payer: Self-pay | Admitting: Physical Therapy

## 2021-12-04 DIAGNOSIS — M6281 Muscle weakness (generalized): Secondary | ICD-10-CM

## 2021-12-04 DIAGNOSIS — M25572 Pain in left ankle and joints of left foot: Secondary | ICD-10-CM

## 2021-12-04 DIAGNOSIS — M25672 Stiffness of left ankle, not elsewhere classified: Secondary | ICD-10-CM

## 2021-12-04 DIAGNOSIS — R2689 Other abnormalities of gait and mobility: Secondary | ICD-10-CM

## 2021-12-04 NOTE — Therapy (Signed)
OUTPATIENT PHYSICAL THERAPY TREATMENT NOTE   Patient Name: [name] MRN: [id number] DOB:March 16, 1971, 51 y.o., female Today's Date: 12/04/2021  PCP:  [name], FNP  REFERRING PROVIDER: Fredirick Maudlin, MD  END OF SESSION:    Past Medical History:  Diagnosis Date   Anemia 03/30/2012   Hx of sickle cell disease   CAP (community acquired pneumonia)    2014   Cholelithiasis 03/30/2012   Chronic wound of extremity 04/01/2012   Elevated LFTs    Leg ulcer ([place]) 10/27/2012   Chronic under care of wound clinic   Multiple open wounds of lower extremity    chronic wounds B/LLE   Sickle cell disease ([place])    Sinus bradycardia by electrocardiogram 04/03/2012   Past Surgical History:  Procedure Laterality Date   ALLOGRAFT APPLICATION Right 7/67/3419   Procedure: SURGICAL PREP FOR GRAFTING RIGHT LOWER EXTREMITY AND APPLICATION OF Jannifer Hick;  Surgeon: Irene Limbo, MD;  Location: [place];  Service: Plastics;  Laterality: Right;   APPLICATION OF A-CELL OF EXTREMITY Right 10/09/2015   Procedure: APPLICATION OF Jannifer Hick;  Surgeon: Irene Limbo, MD;  Location: [place];  Service: Plastics;  Laterality: Right;   BREAST SURGERY     left breast cyst aspiration   CESAREAN SECTION     CESAREAN SECTION N/A 01/06/2014   Procedure: CESAREAN SECTION;  Surgeon: Emily Filbert, MD;  Location: [facility];  Service: Obstetrics;  Laterality: N/A;   CESAREAN SECTION N/A 05/04/2017   Procedure: CESAREAN SECTION;  Surgeon: Woodroe Mode, MD;  Location: [place];  Service: Obstetrics;  Laterality: N/A;   I & D EXTREMITY Right 10/09/2015   Procedure: SURGICAL PREPARATION FOR GRAFTING RIGHT ANKLE AND APPLICATION THERASKIN;  Surgeon: Irene Limbo, MD;  Location: [place];  Service: Plastics;  Laterality: Right;   SKIN FULL THICKNESS GRAFT Bilateral 06/17/2016   Procedure: SURGICAL PREP FOR GRAFTING, BILATERAL LOWER EXTREMITIES AND  APPLICATION OF Jannifer Hick;  Surgeon: Irene Limbo, MD;  Location: [place];  Service: Plastics;  Laterality: Bilateral;   TONSILLECTOMY     Patient Active Problem List   Diagnosis Date Noted   Protein-calorie malnutrition, severe 04/06/2021   Bilateral leg ulcer ([place]) 03/28/2021   Low hemoglobin 05/01/2020   At risk for seizures due to oxygen toxicity 02/09/2020   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   Venous stasis 01/19/2020   Partial loss of skin graft 01/12/2020   Acute respiratory failure with hypoxia ([facility]) 12/15/2019   Bone infarction of distal tibia, 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   Sickle cell anemia with crisis ([place]) 09/03/2018   Other chronic pain    Sickle cell crisis ([place]) 06/26/2018   Sickle cell pain crisis ([place]) 04/07/2018   Avascular necrosis of bone of hip, left ([place]) 12/08/2017   Avascular necrosis of bone of hip, right ([place]) 12/08/2017   Hb-SS disease without crisis ([place]) 11/26/2017   Anemia of chronic disease 11/26/2017   Abnormal quad screen    Ringworm of body 04/05/2017   Ulcer of right lower extremity ([place])    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[place]) 03/02/2017   History of pre-eclampsia 12/16/2016   Previous cesarean delivery x 2 07/22/2013   Hemochromatosis 11/10/2012   Chronic pain syndrome 37/90/2409   Complicated wound infection 04/01/2012   Leukocytosis 03/30/2012    REFERRING DIAG: decreased ROM in left ankle  THERAPY DIAG:  No diagnosis found.  Rationale for Evaluation and Treatment Rehabilitation  PERTINENT HISTORY: Sickle Cell disease.   PRECAUTIONS: None  SUBJECTIVE: Pt states that she just started using the heel inserts prior to arriving to her appt. She reports the ability to put her foot on the ground.   PAIN:  Are you having pain? Yes: NPRS scale: 2/10 Pain location: L ankle Pain description: Pulling sensation Aggravating factors: Walking, squatting, stairs Relieving factors: Sitting, laying down, pain medication.   OBJECTIVE:     DIAGNOSTIC FINDINGS: None   PATIENT SURVEYS:  LEFS 19/80   COGNITION:           Overall cognitive status: Within functional limits for tasks assessed                          SENSATION: WFL   POSTURE: No Significant postural limitations   PALPATION: None.    LOWER EXTREMITY ROM:   Active ROM Right AROM eval Left AROM eval Left PROM  Hip flexion WFL Mod limitation    Hip extension        Hip abduction Min limitation Severe limitation    Hip adduction WFL Severe limitation    Hip internal rotation Mod limitation Severe limitation    Hip external rotation Min limitation Severe limitation    Knee flexion [facility] WFL    Knee extension [facility] WFL    Ankle dorsiflexion NT  -10 (lacking) -8  Ankle plantarflexion NT  50    Ankle inversion NT  18    Ankle eversion NT  20     (Blank rows = not tested)   LOWER EXTREMITY MMT:   MMT Right eval Left eval  Hip flexion 5 3+  Knee flexion NT  5  Knee extension NT  5  Ankle dorsiflexion NT  4+  Ankle plantarflexion NT  4+  Ankle inversion NT  4  Ankle eversion NT  4   (Blank rows = not tested)   FUNCTIONAL TESTS:  Timed up and go (TUG): 19 sec   GAIT: Distance walked: 73f  Assistive device utilized: Single point cane Level of assistance: Complete Independence Comments: Use of SPC, walking on tippee toes on L LE.    TODAY'S TREATMENT: [facility] Adult PT Treatment:                                                DATE: 12/06/2021 Therapeutic Exercise: Supine TKE into ball L LE 5 sec hold 2x10  DF, IV, EV with RTB x15  Calf stretch on slant board 2 x 1 min Rocker board with focus on DF x 20  Seated toe raises x 20  LAQ 2x10  Lumbar rotations x 30 due to increased tension in L lumbar ms from holding hip up on L side with ambulation.  Gait 1057fwith SPC and inserts.  Manual Therapy: AP Talocrural joint mobs with OP into DF STM to medial/ lateral gastroc.   [facility]                                                 DATE: 12/04/2021 Therapeutic Exercise: Supine TKE into ball L LE 5 sec hold x10  SLR to tolerance x8 - painful in L hip Calf stretch on slant board 2 x 1 min Hamstring  stretch- Pt has onset of pain prior to stretch  Lumbar rotations x 30 due to increased tension in L lumbar ms from holding hip up on L side with ambulation.  Manual Therapy: AP Talocrural joint mobs with OP into DF IASTYM to L gastroc  Self Care: Heel  lift, educated pt on wearing appropriate shoe to fit both feet to ensure heel lift stays under L heel.    PATIENT EDUCATION:  Education details: Educated pt on anatomy and physiology of current symptoms, LEFS questionnaire, diagnosis, prognosis, HEP,  and POC. Person educated: Patient Education method: Explanation, Demonstration, Tactile cues, Verbal cues, and Handouts Education comprehension: verbalized understanding and returned demonstration   HOME EXERCISE PROGRAM: Access Code: [email or web address] URL: [email or web address] Date: 11/27/2021 Prepared by: Rudi Heap   Exercises - Supine Single Leg Ankle Pumps  - 2 x daily - 7 x weekly - 3 sets - 10 reps - Ankle Alphabet in Elevation  - 2 x daily - 7 x weekly - 2 sets - Seated Toe Raise  - 2 x daily - 7 x weekly - 2 sets - 10 reps - Seated Long Arc Quad  - 2 x daily - 7 x weekly - 2 sets - 10 reps - Long Sitting Ankle Eversion with Resistance  - 2 x daily - 7 x weekly - 2 sets - 10 reps - Long Sitting Ankle Dorsiflexion with Anchored Resistance  - 2 x daily - 7 x weekly - 2 sets - 10 reps - Long Sitting Ankle Inversion with Resistance  - 2 x daily - 7 x weekly - 2 sets - 10 reps   ASSESSMENT:   CLINICAL IMPRESSION: Patient presents with closed toe shoes and inserts under L heel. Pt ambulates with with improved gait mechanics, but decreased heel to toe motion. She tolerated session well today with continuous improvements noted with DF. Pt tolerates manual well, but reports tender spots over scar on lateral  malleoli. Improved gait with focus on heel to toe gait pattern at end of session. Pt will continue to benefit from skilled PT to address continued deficits.      OBJECTIVE IMPAIRMENTS Abnormal gait, decreased activity tolerance, decreased balance, decreased endurance, decreased knowledge of condition, decreased mobility, difficulty walking, decreased ROM, decreased strength, impaired flexibility, impaired sensation, impaired tone, and pain.    ACTIVITY LIMITATIONS carrying, lifting, bending, standing, squatting, stairs, transfers, dressing, and caring for others   PARTICIPATION LIMITATIONS: meal prep, cleaning, laundry, shopping, and community activity   PERSONAL FACTORS Education, Past/current experiences, Time since onset of injury/illness/exacerbation, and 1 comorbidity: Sickle cell anemia  are also affecting patient's functional outcome.    REHAB POTENTIAL: Good   CLINICAL DECISION MAKING: Stable/uncomplicated   EVALUATION COMPLEXITY: Low     GOALS: Goals reviewed with patient? No   SHORT TERM GOALS: Target date: 12/25/2021  Pt will be I and compliant with initial HEP. Baseline:  Goal status: INITIAL   3.  Pt will increase LEFS score to 28/80 indicating minimal detectable change Baseline:  Goal status: INITIAL   LONG TERM GOALS: Target date: 01/22/2022    Pt will be I and compliant with comprhensiveHEP. Baseline:  Goal status: INITIAL   2.  Increase LEFS to 56/80 indicating minimal detectable change Baseline:  Goal status: INITIAL   3.  Pt will report <2/10 pain at baseline  Baseline:  Goal status: INITIAL   4.  Pt will improve DF to neutral on L LE. Baseline:  Goal status: INITIAL   5.  Pt will be able to ascend/ descend stairs with alternating gait patten.  Baseline:  Goal status: INITIAL   PLAN: PT FREQUENCY: 2x/week   PT DURATION: 8 weeks   PLANNED INTERVENTIONS: Therapeutic exercises, Therapeutic activity, Neuromuscular re-education, Balance training,  Gait training, Patient/Family education, Joint manipulation, Joint mobilization, Stair training, Orthotic/Fit training, Aquatic Therapy, Dry Needling, Electrical stimulation, Cryotherapy, Moist heat, Manual lymph drainage, Compression bandaging, scar mobilization, Taping, Traction, Ultrasound, Manual therapy, and Re-evaluation   PLAN FOR NEXT SESSION: Assess HEP/update PRN, continue to progress functional mobility, strengthen L LE musculature. Decrease patients pain and help minimize functional deficits.     Lynden Ang, PT 12/04/2021, 4:54 PM

## 2021-12-06 ENCOUNTER — Ambulatory Visit: Payer: Medicaid Other | Admitting: Physical Therapy

## 2021-12-06 ENCOUNTER — Encounter (HOSPITAL_BASED_OUTPATIENT_CLINIC_OR_DEPARTMENT_OTHER): Payer: Medicaid Other | Admitting: General Surgery

## 2021-12-06 ENCOUNTER — Encounter: Payer: Self-pay | Admitting: Physical Therapy

## 2021-12-06 DIAGNOSIS — M25672 Stiffness of left ankle, not elsewhere classified: Secondary | ICD-10-CM

## 2021-12-06 DIAGNOSIS — M6281 Muscle weakness (generalized): Secondary | ICD-10-CM

## 2021-12-06 DIAGNOSIS — M25572 Pain in left ankle and joints of left foot: Secondary | ICD-10-CM

## 2021-12-06 DIAGNOSIS — R2689 Other abnormalities of gait and mobility: Secondary | ICD-10-CM

## 2021-12-06 DIAGNOSIS — L97818 Non-pressure chronic ulcer of other part of right lower leg with other specified severity: Secondary | ICD-10-CM | POA: Diagnosis not present

## 2021-12-10 NOTE — Progress Notes (Signed)
EMBER, HENRIKSON ([id number]) Visit Report for 12/06/2021 Arrival Information Details Patient Name: Date of Service: [name] 12/06/2021 11:00 A M Medical Record Number: [id number] Patient Account Number: [id number] Date of Birth/Sex: Treating RN: [birth year] (51 y.o. [name] Primary Care Lukis Bunt: [name] Other Clinician: Referring Xaine Sansom: Treating Miia Blanks/Extender: Elyse Jarvis Weeks in Treatment: 33 Visit Information History Since Last Visit Added or deleted any medications: No Patient Arrived: [name] Any new allergies or adverse reactions: No Arrival Time: 11:22 Had a fall or experienced change in No Accompanied By: self activities of daily living that may affect Transfer Assistance: None risk of falls: Patient Identification Verified: Yes Signs or symptoms of abuse/neglect since last visito No Secondary Verification Process Completed: Yes Hospitalized since last visit: No Patient Requires Transmission-Based Precautions: No Implantable device outside of the clinic excluding No Patient Has Alerts: No cellular tissue based products placed in the center since last visit: Has Dressing in Place as Prescribed: Yes Has Compression in Place as Prescribed: Yes Pain Present Now: Yes Electronic Signature(s) Signed: 12/06/2021 5:36:35 PM By: Sharyn Creamer RN, BSN Entered By: Sharyn Creamer on 12/06/2021 11:23:25 -------------------------------------------------------------------------------- Encounter Discharge Information Details Patient Name: Date of Service: [name], [name] 12/06/2021 11:00 A M Medical Record Number: [id number] Patient Account Number: [id number] Date of Birth/Sex: Treating RN: Jan 02, 1971 (51 y.o. [name] Primary Care Daijon Wenke: [name] Other Clinician: Referring Jamisen Duerson: Treating Ishaan Villamar/Extender: Elyse Jarvis Weeks in Treatment: 23 Encounter Discharge Information Items Post  Procedure Vitals Discharge Condition: Stable Temperature (F): 98.2 Ambulatory Status: Cane Pulse (bpm): 97 Discharge Destination: Home Respiratory Rate (breaths/min): 16 Transportation: Private Auto Blood Pressure (mmHg): 129/66 Accompanied By: self Schedule Follow-up Appointment: Yes Clinical Summary of Care: Patient Declined Electronic Signature(s) Signed: 12/06/2021 5:36:35 PM By: Sharyn Creamer RN, BSN Entered By: Sharyn Creamer on 12/06/2021 17:15:01 -------------------------------------------------------------------------------- Lower Extremity Assessment Details Patient Name: Date of Service: [name], [name] 12/06/2021 11:00 A M Medical Record Number: [id number] Patient Account Number: [id number] Date of Birth/Sex: Treating RN: 1971/04/15 (51 y.o. [name] Primary Care Seymone Forlenza: [name] Other Clinician: Referring Daphane Odekirk: Treating Jeoffrey Eleazer/Extender: Elyse Jarvis Weeks in Treatment: 33 Edema Assessment Assessed: [Left: No] [Right: No] Edema: [Left: Ye] [Right: s] Calf Left: Right: Point of Measurement: 33 cm From Medial Instep 28.5 cm 30 cm Ankle Left: Right: Point of Measurement: 10 cm From Medial Instep 18 cm 20 cm Vascular Assessment Pulses: Dorsalis Pedis Palpable: [Left:Yes] [Right:Yes] Electronic Signature(s) Signed: 12/06/2021 5:36:35 PM By: Sharyn Creamer RN, BSN Entered By: Sharyn Creamer on 12/06/2021 11:36:05 -------------------------------------------------------------------------------- Multi Wound Chart Details Patient Name: Date of Service: [name], [name] 12/06/2021 11:00 A M Medical Record Number: [id number] Patient Account Number: [id number] Date of Birth/Sex: Treating RN: 15-Jan-1971 (51 y.o. [name] Primary Care Federick Levene: [name] Other Clinician: Referring Jeromey Kruer: Treating Sheronica Corey/Extender: Elyse Jarvis Weeks in Treatment: 5 Vital Signs Height(in):  41 Pulse(bpm): 97 Weight(lbs): Blood Pressure(mmHg): 129/66 Body Mass Index(BMI): Temperature(F): 98.2 Respiratory Rate(breaths/min): 16 Photos: Right, Lateral Lower Leg Right, Medial Ankle Left, Medial Ankle Wound Location: Gradually Appeared Gradually Appeared Gradually Appeared Wounding Event: Sickle Cell Lesion Sickle Cell Lesion Lesion Primary Etiology: N/A Venous Leg Ulcer N/A Secondary Etiology: Anemia, Sickle Cell Disease, Anemia, Sickle Cell Disease, Anemia, Sickle Cell Disease, Comorbid History: Neuropathy Neuropathy Neuropathy 10/05/2012 06/26/2021 11/29/2021 Date Acquired: 33 23 1 Weeks of Treatment: Open Open Open Wound Status: No No No Wound Recurrence: Yes No No Clustered  Wound: 5.5x4.8x0.1 5.6x2.5x0.1 0.2x0.2x0.1 Measurements L x W x D (cm) 20.735 10.996 0.031 A (cm) : rea 2.073 1.1 0.003 Volume (cm) : 89.00% 62.10% -287.50% % Reduction in A rea: 89.00% 62.00% -200.00% % Reduction in Volume: Full Thickness Without Exposed Full Thickness Without Exposed Full Thickness Without Exposed Classification: Support Structures Support Structures Support Structures Medium Medium Medium Exudate A mount: Purulent Purulent Serosanguineous Exudate Type: yellow, brown, green yellow, brown, green red, brown Exudate Color: Flat and Intact Distinct, outline attached N/A Wound Margin: Small (1-33%) Small (1-33%) Small (1-33%) Granulation A mount: Pink, Pale Pink Pink Granulation Quality: Large (67-100%) Large (67-100%) Large (67-100%) Necrotic A mount: Fat Layer (Subcutaneous Tissue): Yes Fat Layer (Subcutaneous Tissue): Yes Fat Layer (Subcutaneous Tissue): Yes Exposed Structures: Fascia: No Fascia: No Fascia: No Tendon: No Tendon: No Tendon: No Muscle: No Muscle: No Muscle: No Joint: No Joint: No Joint: No Bone: No Bone: No Bone: No Large (67-100%) Small (1-33%) Small (1-33%) Epithelialization: Debridement - Selective/Open Wound Debridement -  Selective/Open Wound Debridement - Selective/Open Wound Debridement: Pre-procedure Verification/Time Out 11:44 11:44 11:44 Taken: Lidocaine 4% Topical Solution Lidocaine 4% Topical Solution Lidocaine 4% Topical Solution Pain Control: Tourist information centre manager, [employer] Tissue Debrided: Non-Viable Tissue Non-Viable Tissue Non-Viable Tissue Level: 26.4 14 0.04 Debridement A (sq cm): rea Curette Curette Curette Instrument: Minimum Minimum Minimum Bleeding: Pressure Pressure Pressure Hemostasis A chieved: 0 0 0 Procedural Pain: 0 0 0 Post Procedural Pain: Procedure was tolerated well Procedure was tolerated well Procedure was tolerated well Debridement Treatment Response: 5.5x4.8x0.1 5.6x2.5x0.1 0.2x0.2x0.1 Post Debridement Measurements L x W x D (cm) 2.073 1.1 0.003 Post Debridement Volume: (cm) Cellular or Tissue Based Product Cellular or Tissue Based Product Debridement Procedures Performed: Debridement Debridement Treatment Notes Electronic Signature(s) Signed: 12/06/2021 12:19:52 PM By: Fredirick Maudlin MD FACS Signed: 12/10/2021 12:55:32 PM By: Dellie Catholic RN Entered By: Fredirick Maudlin on 12/06/2021 12:19:52 -------------------------------------------------------------------------------- Multi-Disciplinary Care Plan Details Patient Name: Date of Service: [name], [name] 12/06/2021 11:00 A M Medical Record Number: [id number] Patient Account Number: [id number] Date of Birth/Sex: Treating RN: [birth year] (51 y.o. [name] Primary Care Renie Stelmach: [name] Other Clinician: Referring Yashira Offenberger: Treating Lena Fieldhouse/Extender: Elyse Jarvis Weeks in Treatment: 33 Multidisciplinary Care Plan reviewed with physician Active Inactive Venous Leg Ulcer Nursing Diagnoses: Actual venous Insuffiency (use after diagnosis is confirmed) Knowledge deficit related to disease process and management Goals: Patient will maintain optimal edema  control Date Initiated: 06/26/2021 Target Resolution Date: 12/14/2021 Goal Status: Active Interventions: Assess peripheral edema status every visit. Compression as ordered Treatment Activities: Therapeutic compression applied : 06/26/2021 Notes: Wound/Skin Impairment Nursing Diagnoses: Impaired tissue integrity Goals: Patient/caregiver will verbalize understanding of skin care regimen Date Initiated: 04/17/2021 Target Resolution Date: 12/14/2021 Goal Status: Active Ulcer/skin breakdown will have a volume reduction of 30% by week 4 Date Initiated: 04/17/2021 Date Inactivated: 05/29/2021 Target Resolution Date: 05/15/2021 Goal Status: Met Ulcer/skin breakdown will have a volume reduction of 50% by week 8 Date Initiated: 05/29/2021 Date Inactivated: 06/26/2021 Target Resolution Date: 06/26/2021 Goal Status: Unmet Unmet Reason: venous reflux Interventions: Assess patient/caregiver ability to obtain necessary supplies Assess patient/caregiver ability to perform ulcer/skin care regimen upon admission and as needed Assess ulceration(s) every visit Provide education on ulcer and skin care Treatment Activities: Topical wound management initiated : 04/17/2021 Notes: 06/08/21: Left leg wounds greater than 30% volume reduction, right leg acute infection. Electronic Signature(s) Signed: 12/06/2021 5:36:35 PM By: Sharyn Creamer RN, BSN Entered By: Sharyn Creamer on 12/06/2021 17:13:28 -------------------------------------------------------------------------------- Pain  Assessment Details Patient Name: Date of Service: [name] 12/06/2021 11:00 A M Medical Record Number: [id number] Patient Account Number: [id number] Date of Birth/Sex: Treating RN: 05-02-1971 (51 y.o. [name] Primary Care Jalani Cullifer: [name] Other Clinician: Referring Rowdy Guerrini: Treating Greggory Safranek/Extender: Elyse Jarvis Weeks in Treatment: 22 Active Problems Location of Pain  Severity and Description of Pain Patient Has Paino Yes Site Locations Rate the pain. Rate the pain. Current Pain Level: 4 Character of Pain Describe the Pain: Aching Pain Management and Medication Current Pain Management: Electronic Signature(s) Signed: 12/06/2021 5:36:35 PM By: Sharyn Creamer RN, BSN Entered By: Sharyn Creamer on 12/06/2021 11:22:49 -------------------------------------------------------------------------------- Patient/Caregiver Education Details Patient Name: Date of Service: [name], [name] 7/20/2023andnbsp11:00 [place] Number: [id number] Patient Account Number: [id number] Date of Birth/Gender: Treating RN: 04-Apr-1971 (51 y.o. [name] Primary Care Physician: [name] Other Clinician: Referring Physician: Treating Physician/Extender: Elyse Jarvis Weeks in Treatment: 46 Education Assessment Education Provided To: Patient Education Topics Provided Wound/Skin Impairment: Methods: Explain/Verbal Responses: State content correctly Electronic Signature(s) Signed: 12/06/2021 5:36:35 PM By: Sharyn Creamer RN, BSN Entered By: Sharyn Creamer on 12/06/2021 17:13:47 -------------------------------------------------------------------------------- Wound Assessment Details Patient Name: Date of Service: [name], [name] 12/06/2021 11:00 A M Medical Record Number: [id number] Patient Account Number: [id number] Date of Birth/Sex: Treating RN: [birth year] (51 y.o. [name] Primary Care Berdell Hostetler: Other Clinician: [name] Referring Tyree Fluharty: Treating Lindzy Rupert/Extender: Elyse Jarvis Weeks in Treatment: 33 Wound Status Wound Number: 17 Primary Etiology: Sickle Cell Lesion Wound Location: Right, Lateral Lower Leg Wound Status: Open Wounding Event: Gradually Appeared Comorbid History: Anemia, Sickle Cell Disease, Neuropathy Date Acquired: 10/05/2012 Weeks Of Treatment:  33 Clustered Wound: Yes Photos Wound Measurements Length: (cm) 5.5 Width: (cm) 4.8 Depth: (cm) 0.1 Area: (cm) 20.735 Volume: (cm) 2.073 % Reduction in Area: 89% % Reduction in Volume: 89% Epithelialization: Large (67-100%) Tunneling: No Undermining: No Wound Description Classification: Full Thickness Without Exposed Support Structures Wound Margin: Flat and Intact Exudate Amount: Medium Exudate Type: Purulent Exudate Color: yellow, brown, green Foul Odor After Cleansing: No Slough/Fibrino Yes Wound Bed Granulation Amount: Small (1-33%) Exposed Structure Granulation Quality: Pink, Pale Fascia Exposed: No Necrotic Amount: Large (67-100%) Fat Layer (Subcutaneous Tissue) Exposed: Yes Necrotic Quality: Adherent Slough Tendon Exposed: No Muscle Exposed: No Joint Exposed: No Bone Exposed: No Treatment Notes Wound #17 (Lower Leg) Wound Laterality: Right, Lateral Cleanser Soap and Water Discharge Instruction: May shower and wash wound with dial antibacterial soap and water prior to dressing change. Wound Cleanser Discharge Instruction: Cleanse the wound with wound cleanser prior to applying a clean dressing using gauze sponges, not tissue or cotton balls. Peri-Wound Care Triamcinolone 15 (g) Discharge Instruction: Use triamcinolone 15 (g) as directed Sween Lotion (Moisturizing lotion) Discharge Instruction: Apply moisturizing lotion as directed Topical Mupirocin Ointment Discharge Instruction: Apply Mupirocin (Bactroban) as instructed Primary Dressing PROGENA MATRIX Secondary Dressing ABD Pad, 5x9 Discharge Instruction: Apply over primary dressing as directed. Zetuvit Plus 4x8 in Discharge Instruction: Apply over primary dressing as directed. Secured With Compression Wrap Kerlix Roll 4.5x3.1 (in/yd) Discharge Instruction: Apply Kerlix and Coban compression as directed. Coban Self-Adherent Wrap 4x5 (in/yd) Discharge Instruction: Apply over Kerlix as  directed. Compression Stockings Add-Ons Electronic Signature(s) Signed: 12/06/2021 5:36:35 PM By: Sharyn Creamer RN, BSN Entered By: Sharyn Creamer on 12/06/2021 11:37:38 -------------------------------------------------------------------------------- Wound Assessment Details Patient Name: Date of Service: [name], [name] 12/06/2021 11:00 A M Medical Record Number: [id number] Patient  Account Number: [id number] Date of Birth/Sex: Treating RN: 08/27/1970 (51 y.o. [name] Primary Care Tannie Koskela: [name] Other Clinician: Referring Kensie Susman: Treating Addeline Calarco/Extender: Elyse Jarvis Weeks in Treatment: 33 Wound Status Wound Number: 21 Primary Etiology: Sickle Cell Lesion Wound Location: Right, Medial Ankle Secondary Etiology: Venous Leg Ulcer Wounding Event: Gradually Appeared Wound Status: Open Date Acquired: 06/26/2021 Comorbid History: Anemia, Sickle Cell Disease, Neuropathy Weeks Of Treatment: 23 Clustered Wound: No Photos Wound Measurements Length: (cm) 5.6 Width: (cm) 2.5 Depth: (cm) 0.1 Area: (cm) 10.996 Volume: (cm) 1.1 % Reduction in Area: 62.1% % Reduction in Volume: 62% Epithelialization: Small (1-33%) Tunneling: No Undermining: No Wound Description Classification: Full Thickness Without Exposed Support Structures Wound Margin: Distinct, outline attached Exudate Amount: Medium Exudate Type: Purulent Exudate Color: yellow, brown, green Wound Bed Granulation Amount: Small (1-33%) Granulation Quality: Pink Necrotic Amount: Large (67-100%) Necrotic Quality: Adherent Slough Foul Odor After Cleansing: No Slough/Fibrino Yes Exposed Structure Fascia Exposed: No Fat Layer (Subcutaneous Tissue) Exposed: Yes Tendon Exposed: No Muscle Exposed: No Joint Exposed: No Bone Exposed: No Treatment Notes Wound #21 (Ankle) Wound Laterality: Right, Medial Cleanser Soap and Water Discharge Instruction: May shower and wash wound  with dial antibacterial soap and water prior to dressing change. Wound Cleanser Discharge Instruction: Cleanse the wound with wound cleanser prior to applying a clean dressing using gauze sponges, not tissue or cotton balls. Peri-Wound Care Triamcinolone 15 (g) Discharge Instruction: Use triamcinolone 15 (g) as directed Sween Lotion (Moisturizing lotion) Discharge Instruction: Apply moisturizing lotion as directed Topical Mupirocin Ointment Discharge Instruction: Apply Mupirocin (Bactroban) as instructed Primary Dressing PROGENA MATRIX Secondary Dressing ABD Pad, 5x9 Discharge Instruction: Apply over primary dressing as directed. Zetuvit Plus 4x8 in Discharge Instruction: Apply over primary dressing as directed. Secured With Compression Wrap Kerlix Roll 4.5x3.1 (in/yd) Discharge Instruction: Apply Kerlix and Coban compression as directed. Coban Self-Adherent Wrap 4x5 (in/yd) Discharge Instruction: Apply over Kerlix as directed. Compression Stockings Add-Ons Electronic Signature(s) Signed: 12/06/2021 5:36:35 PM By: Sharyn Creamer RN, BSN Entered By: Sharyn Creamer on 12/06/2021 11:38:21 -------------------------------------------------------------------------------- Wound Assessment Details Patient Name: Date of Service: [name], [name] 12/06/2021 11:00 A M Medical Record Number: [id number] Patient Account Number: [id number] Date of Birth/Sex: Treating RN: 11-13-1970 (51 y.o. [name] Primary Care Jolita Haefner: [name] Other Clinician: Referring Enjoli Tidd: Treating Karmela Bram/Extender: Elyse Jarvis Weeks in Treatment: 33 Wound Status Wound Number: 22 Primary Etiology: Lesion Wound Location: Left, Medial Ankle Wound Status: Open Wounding Event: Gradually Appeared Comorbid History: Anemia, Sickle Cell Disease, Neuropathy Date Acquired: 11/29/2021 Weeks Of Treatment: 1 Clustered Wound: No Photos Wound Measurements Length: (cm)  0.2 Width: (cm) 0.2 Depth: (cm) 0.1 Area: (cm) 0.031 Volume: (cm) 0.003 % Reduction in Area: -287.5% % Reduction in Volume: -200% Epithelialization: Small (1-33%) Tunneling: No Undermining: No Wound Description Classification: Full Thickness Without Exposed Support Structures Exudate Amount: Medium Exudate Type: Serosanguineous Exudate Color: red, brown Foul Odor After Cleansing: No Slough/Fibrino Yes Wound Bed Granulation Amount: Small (1-33%) Exposed Structure Granulation Quality: Pink Fascia Exposed: No Necrotic Amount: Large (67-100%) Fat Layer (Subcutaneous Tissue) Exposed: Yes Necrotic Quality: Adherent Slough Tendon Exposed: No Muscle Exposed: No Joint Exposed: No Bone Exposed: No Treatment Notes Wound #22 (Ankle) Wound Laterality: Left, Medial Cleanser Soap and Water Discharge Instruction: May shower and wash wound with dial antibacterial soap and water prior to dressing change. Peri-Wound Care Topical Mupirocin Ointment Discharge Instruction: Apply Mupirocin (Bactroban) as instructed Primary Dressing KerraCel Ag Gelling Fiber Dressing, 2x2 in (silver alginate)  Discharge Instruction: Apply silver alginate to wound bed as instructed Secondary Dressing Bordered Gauze, 2x2 in Discharge Instruction: Apply over primary dressing as directed. Secured With Compression Wrap Compression Stockings Environmental education officer) Signed: 12/06/2021 5:36:35 PM By: Sharyn Creamer RN, BSN Signed: 12/06/2021 5:36:35 PM By: Sharyn Creamer RN, BSN Entered By: Sharyn Creamer on 12/06/2021 11:40:57 -------------------------------------------------------------------------------- [place] Details Patient Name: Date of Service: [name], [name] 12/06/2021 11:00 A M Medical Record Number: [id number] Patient Account Number: [id number] Date of Birth/Sex: Treating RN: 17-Mar-1971 (51 y.o. [name] Primary Care Darlyn Repsher: [name] Other Clinician: Referring  Mirriam Vadala: Treating Kharter Brew/Extender: Elyse Jarvis Weeks in Treatment: 33 Vital Signs Time Taken: 11:22 Temperature (F): 98.2 Height (in): 67 Pulse (bpm): 97 Respiratory Rate (breaths/min): 16 Blood Pressure (mmHg): 129/66 Reference Range: 80 - 120 mg / dl Electronic Signature(s) Signed: 12/06/2021 5:36:35 PM By: Sharyn Creamer RN, BSN Entered By: Sharyn Creamer on 12/06/2021 11:23:57

## 2021-12-10 NOTE — Therapy (Unsigned)
OUTPATIENT PHYSICAL THERAPY TREATMENT NOTE/4th VISIT RE-ASSESSMENT   Patient Name: [name] MRN: [id number] DOB:07-01-1970, 50 y.o., female 83 Date: 12/11/2021  PCP:  [name], FNP  REFERRING PROVIDER: Fredirick Maudlin, MD  END OF SESSION:   PT End of Session - 12/11/21 1227     Visit Number 4    Number of Visits 16    Date for PT Re-Evaluation 01/22/22    Authorization Type [place] - Visit Number 2    Authorization - Number of Visits 4    PT Start Time 2703    PT Stop Time 1300    PT Time Calculation (min) 30 min    Activity Tolerance Patient tolerated treatment well    Behavior During Therapy WFL for tasks assessed/performed             Past Medical History:  Diagnosis Date   Anemia 03/30/2012   Hx of sickle cell disease   CAP (community acquired pneumonia)    2014   Cholelithiasis 03/30/2012   Chronic wound of extremity 04/01/2012   Elevated LFTs    Leg ulcer ([place]) 10/27/2012   Chronic under care of wound clinic   Multiple open wounds of lower extremity    chronic wounds B/LLE   Sickle cell disease ([place])    Sinus bradycardia by electrocardiogram 04/03/2012   Past Surgical History:  Procedure Laterality Date   ALLOGRAFT APPLICATION Right 5/00/9381   Procedure: SURGICAL PREP FOR GRAFTING RIGHT LOWER EXTREMITY AND APPLICATION OF Jannifer Hick;  Surgeon: Irene Limbo, MD;  Location: [facility];  Service: Plastics;  Laterality: Right;   APPLICATION OF A-CELL OF EXTREMITY Right 10/09/2015   Procedure: APPLICATION OF Jannifer Hick;  Surgeon: Irene Limbo, MD;  Location: [facility];  Service: Plastics;  Laterality: Right;   BREAST SURGERY     left breast cyst aspiration   CESAREAN SECTION     CESAREAN SECTION N/A 01/06/2014   Procedure: CESAREAN SECTION;  Surgeon: Emily Filbert, MD;  Location: [facility];  Service: Obstetrics;  Laterality: N/A;   CESAREAN SECTION N/A 05/04/2017    Procedure: CESAREAN SECTION;  Surgeon: Woodroe Mode, MD;  Location: [facility];  Service: Obstetrics;  Laterality: N/A;   I & D EXTREMITY Right 10/09/2015   Procedure: SURGICAL PREPARATION FOR GRAFTING RIGHT ANKLE AND APPLICATION THERASKIN;  Surgeon: Irene Limbo, MD;  Location: [facility];  Service: Plastics;  Laterality: Right;   SKIN FULL THICKNESS GRAFT Bilateral 06/17/2016   Procedure: SURGICAL PREP FOR GRAFTING, BILATERAL LOWER EXTREMITIES AND APPLICATION OF Jannifer Hick;  Surgeon: Irene Limbo, MD;  Location: [facility];  Service: Plastics;  Laterality: Bilateral;   TONSILLECTOMY     Patient Active Problem List   Diagnosis Date Noted   Protein-calorie malnutrition, severe 04/06/2021   Bilateral leg ulcer ([place]) 03/28/2021   Low hemoglobin 05/01/2020   At risk for seizures due to oxygen toxicity 02/09/2020   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   Venous stasis 01/19/2020   Partial loss of skin graft 01/12/2020   Acute respiratory failure with hypoxia ([facility]) 12/15/2019   Bone infarction of distal tibia, 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   Sickle cell anemia with crisis ([place]) 09/03/2018   Other chronic pain    Sickle cell crisis ([place]) 06/26/2018   Sickle cell pain crisis ([place]) 04/07/2018   Avascular necrosis of bone of hip, left ([place]) 12/08/2017   Avascular necrosis of bone of hip, right ([place]) 12/08/2017   Hb-SS disease  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 ([place]) 11/26/2017   Anemia of chronic disease 11/26/2017   Abnormal quad screen    Ringworm of body 04/05/2017   Ulcer of right lower extremity ([place])    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   PRECAUTIONS: None  SUBJECTIVE: Continued moderate ankle soreness and limited mobility, some related to ongoing L hip pathology  PAIN:  Are you having pain? Yes: NPRS scale: 2/10 Pain location: L ankle Pain description: Pulling sensation Aggravating factors: Walking, squatting, stairs Relieving factors: Sitting, laying down, pain medication.   OBJECTIVE:    DIAGNOSTIC FINDINGS: None   PATIENT SURVEYS:  LEFS 19/80; 12/11/21 17/80   COGNITION:           Overall cognitive status: Within functional limits for tasks assessed                          SENSATION: WFL   POSTURE: No Significant postural limitations   PALPATION: None.    LOWER EXTREMITY ROM:   Active ROM Right AROM eval Left AROM eval Left PROM  Hip flexion WFL Mod limitation    Hip extension        Hip abduction Min limitation Severe limitation    Hip adduction WFL Severe limitation    Hip internal rotation Mod limitation Severe limitation    Hip external rotation Min limitation Severe limitation    Knee flexion [facility] WFL    Knee extension [facility] WFL    Ankle dorsiflexion NT  -10 (lacking) -8  Ankle plantarflexion NT  50    Ankle inversion NT  18    Ankle eversion NT  20     (Blank rows = not tested)   LOWER EXTREMITY MMT:   MMT Right eval Left eval  Hip flexion 5 3+  Knee flexion NT  5  Knee extension NT  5  Ankle dorsiflexion NT  4+  Ankle plantarflexion NT  4+  Ankle inversion NT  4  Ankle eversion NT  4   (Blank rows = not tested)   FUNCTIONAL TESTS:  Timed up and go (TUG): 19 sec   GAIT: Distance walked: 22f  Assistive device utilized: Single point cane Level of assistance: Complete [place] Comments: Use of SPC, walking on tippee toes on L LE.    TODAY'S TREATMENT: [facility] Adult PT Treatment:                                                DATE: 12/11/21 Therapeutic Exercise: Seated toe raises 10x Seated LAQs  10x LTR 10x B Ankle alphabet while elevated Ankle pumps while elevated 10x Seated INV/EV/DF RTB 10x ea. Manual Therapy: Subtalar DF and INV/EV mobs   [facility]  [facility] Therapeutic Exercise: Supine TKE into ball L LE 5 sec hold 2x10  DF, IV, EV with RTB x15  Calf stretch on slant board 2 x 1 min Rocker board with focus on DF x 20  Seated toe raises x 20  LAQ 2x10  Lumbar rotations x 30 due to increased tension in L lumbar ms from holding hip up on L side with ambulation.  Gait 173f with SPC and inserts.  Manual Therapy: AP Talocrural joint mobs with OP into DF STM to medial/ lateral gastroc.   [facility]                                                [facility]: 12[facility]2021 Therapeutic Exercise: Supine TKE into ball L LE 5 sec hold x10  SLR to tolerance x8 - painful in L hip Calf stretch on slant board 2 x 1 min Hamstring stretch- Pt has onset of pain prior to stretch  Lumbar rotations x 30 due to increased tension in L lumbar ms from holding hip up on L side with ambulation.  Manual Therapy: AP Talocrural joint mobs with OP into DF IASTYM to L gastroc  Self Care: Heel  lift, educated pt on wearing appropriate shoe to fit both feet to ensure heel lift stays under L heel.    PATIENT EDUCATION:  Education details: Educated pt on anatomy and physiology of current symptoms, LEFS questionnaire, diagnosis, prognosis, HEP,  and POC. Person educated: Patient Education method: Explanation, Demonstration, Tactile cues, Verbal cues, and Handouts Education comprehension: verbalized understanding and returned demonstration   HOME EXERCISE PROGRAM: Access Code: [email or web address] URL: [email or web address] Date: 11/27/2021 Prepared by: SRudi Heap  Exercises - Supine Single Leg Ankle Pumps  - 2 x daily - 7 x weekly - 3 sets - 10 reps - Ankle Alphabet in Elevation  - 2 x daily - 7 x weekly - 2 sets -  Seated Toe Raise  - 2 x daily - 7 x weekly - 2 sets - 10 reps - Seated Long Arc Quad  - 2 x daily - 7 x weekly - 2 sets - 10 reps - Long Sitting Ankle Eversion with Resistance  - 2 x daily - 7 x weekly - 2 sets - 10 reps - Long Sitting Ankle Dorsiflexion with Anchored Resistance  - 2 x daily - 7 x weekly - 2 sets - 10 reps - Long Sitting Ankle Inversion with Resistance  - 2 x daily - 7 x weekly - 2 sets - 10 reps   ASSESSMENT:   CLINICAL IMPRESSION: Patient seen for re-assessment and demonstrates a decrease in pain levels, increased DF ROM, modified stair negotiation technique needing to demo a step to pattern descending which decreases her functional mobility and increases her fall risk.  Patient is compliant with her HEP and able to demo to PT w/o need of cuing.  LEFS score essentially unchanged.  Patient has shown progress with limited PT sessions and should continue to improve throughout her POC.     OBJECTIVE IMPAIRMENTS Abnormal gait, decreased activity tolerance, decreased balance, decreased endurance, decreased knowledge of condition, decreased mobility, difficulty walking, decreased ROM, decreased strength, impaired flexibility, impaired sensation, impaired tone, and pain.    ACTIVITY LIMITATIONS carrying, lifting, bending, standing, squatting, stairs, transfers, dressing, and caring for others   PARTICIPATION LIMITATIONS: meal prep, cleaning,  laundry, shopping, and community activity   PERSONAL FACTORS Education, Past/current experiences, Time since onset of injury/illness/exacerbation, and 1 comorbidity: Sickle cell anemia  are also affecting patient's functional outcome.    REHAB POTENTIAL: Good   CLINICAL DECISION MAKING: Stable/uncomplicated   EVALUATION COMPLEXITY: Low     GOALS: Goals reviewed with patient? No   SHORT TERM GOALS: Target date: 12/25/2021  Pt will be I and compliant with initial HEP. Baseline: Patient able to demo to PT w/o need of cuing Goal status:  Met   3.  Pt will increase LEFS score to 28/80 indicating minimal detectable change Baseline: 12/11/21 17/80 Goal status: Ongoing   LONG TERM GOALS: Target date: 01/22/2022    Pt will be I and compliant with comprhensiveHEP. Baseline:  Goal status: ongoing as HEP is evolving   2.  Increase LEFS to 56/80 indicating minimal detectable change Baseline: 12/11/21 17/80  Goal status: ongoing per target date   3.  Pt will report <2/10 pain at baseline  Baseline:  Goal status: Ongoing as pain reported as moderate   4.  Pt will improve DF to neutral on L LE. Baseline: 12/11/21 -2d DF PROM noted at 0/90d knee position Goal status: Ongoing   5.  Pt will be able to ascend/ descend stairs with alternating gait patten.  Baseline: 12/11/21 Able to ascend with [facility] and single rail alternating but descends with step to pattern Goal status: Ongoing   PLAN: PT FREQUENCY: 2x/week   PT DURATION: 8 weeks   PLANNED INTERVENTIONS: Therapeutic exercises, Therapeutic activity, Neuromuscular re-education, Balance training, Gait training, Patient/Family education, Joint manipulation, Joint mobilization, Stair training, Orthotic/Fit training, Aquatic Therapy, Dry Needling, Electrical stimulation, Cryotherapy, Moist heat, Manual lymph drainage, Compression bandaging, scar mobilization, Taping, Traction, Ultrasound, Manual therapy, and Re-evaluation   PLAN FOR NEXT SESSION: Assess HEP/update PRN, continue to progress functional mobility, strengthen L LE musculature. Decrease patients pain and help minimize functional deficits.     Lanice Shirts, PT 12/11/2021, 1:13 PM   Check all possible CPT codes: [phone] - PT Re-evaluation, 97110- Therapeutic Exercise, [phone]- Neuro Re-education, [phone] - Gait Training, [phone] - Manual Therapy, 97530 - Therapeutic Activities, and [phone] - Self Care     If treatment provided at initial evaluation, no treatment charged due to lack of authorization.

## 2021-12-10 NOTE — Progress Notes (Signed)
[name], VITIELLO ([id number]) Visit Report for 12/06/2021 Chief Complaint Document Details Patient Name: Date of Service: [name] [name] 12/06/2021 11:00 A M Medical Record Number: [id number] Patient Account Number: [id number] Date of Birth/Sex: Treating RN: [birth year] (51 y.o. [name] Primary Care Provider: [name] Other Clinician: Referring Provider: Treating Provider/Extender: Elyse Jarvis Weeks in Treatment: 8 Information Obtained from: Patient Chief Complaint the patient is here for evaluation of her bilateral lower extremity sickle cell ulcers 04/17/2021; patient comes in for substantial wounds on the right and left lower leg Electronic Signature(s) Signed: 12/06/2021 12:28:59 PM By: Fredirick Maudlin MD FACS Entered By: Fredirick Maudlin on 12/06/2021 12:28:59 -------------------------------------------------------------------------------- Cellular or Tissue Based Product Details Patient Name: Date of Service: [name] [name], [name] 12/06/2021 11:00 A M Medical Record Number: [id number] Patient Account Number: [id number] Date of Birth/Sex: Treating RN: 1971-01-12 (51 y.o. [name] Primary Care Provider: [name] Other Clinician: Referring Provider: Treating Provider/Extender: Elyse Jarvis Weeks in Treatment: 66 Cellular or Tissue Based Product Type Wound #17 Right,Lateral Lower Leg Applied to: Performed By: Physician Fredirick Maudlin, MD Cellular or Tissue Based Product Type: Other Level of Consciousness (Pre-procedure): Awake and Alert Pre-procedure Verification/Time Out Yes - 11:44 Taken: Location: trunk / arms / legs Wound Size (sq cm): 26.4 Product Size (sq cm): 6 Waste Size (sq cm): 0.5 Waste Reason: fit to size Amount of Product Applied (sq cm): 5.5 Instrument Used: Curette Lot #: [id number] Order #: [id number] Expiration Date: 03/23/2024 Fenestrated: Yes Instrument:  Blade Reconstituted: No Secured: Yes Secured With: Steri-Strips Dressing Applied: Yes Primary Dressing: adaptic Procedural Pain: 0 Post Procedural Pain: 0 Response to Treatment: Procedure was tolerated well Level of Consciousness (Post- Awake and Alert procedure): Post Procedure Diagnosis Same as Pre-procedure Notes Progena matrix*** trial*** Electronic Signature(s) Signed: 12/06/2021 12:47:40 PM By: Fredirick Maudlin MD FACS Signed: 12/06/2021 5:36:35 PM By: Sharyn Creamer RN, BSN Entered By: Sharyn Creamer on 12/06/2021 11:54:55 -------------------------------------------------------------------------------- Cellular or Tissue Based Product Details Patient Name: Date of Service: [name], [name] 12/06/2021 11:00 A M Medical Record Number: [id number] Patient Account Number: [id number] Date of Birth/Sex: Treating RN: Aug 01, 1970 (51 y.o. [name] Primary Care Provider: [name] Other Clinician: Referring Provider: Treating Provider/Extender: Elyse Jarvis Weeks in Treatment: 33 Cellular or Tissue Based Product Type Wound #21 Right,Medial Ankle Applied to: Performed By: Physician Fredirick Maudlin, MD Cellular or Tissue Based Product Type: Other Level of Consciousness (Pre-procedure): Awake and Alert Pre-procedure Verification/Time Out Yes - 11:44 Taken: Location: trunk / arms / legs Wound Size (sq cm): 14 Product Size (sq cm): 4 Waste Size (sq cm): 0 Amount of Product Applied (sq cm): 4 Instrument Used: Curette Lot #: [id number] Order #: [id number] Expiration Date: 09/10/2023 Fenestrated: Yes Instrument: Blade Reconstituted: No Secured: Yes Secured With: Steri-Strips Dressing Applied: Yes Primary Dressing: adaptic Procedural Pain: 0 Post Procedural Pain: 0 Response to Treatment: Procedure was tolerated well Level of Consciousness (Post- Awake and Alert procedure): Post Procedure Diagnosis Same as  Pre-procedure Notes Progena matrix**** trial*** Electronic Signature(s) Signed: 12/06/2021 5:36:35 PM By: Sharyn Creamer RN, BSN Signed: 12/07/2021 8:20:58 AM By: Fredirick Maudlin MD FACS Previous Signature: 12/06/2021 12:47:40 PM Version By: Fredirick Maudlin MD FACS Entered By: Sharyn Creamer on 12/06/2021 17:24:02 -------------------------------------------------------------------------------- Debridement Details Patient Name: Date of Service: [name] [name], [name] 12/06/2021 11:00 A M Medical Record Number: [id number] Patient Account Number: [id number] Date of Birth/Sex: Treating RN: [birth year] (51 y.o. [name] Primary Care  Provider: [name] Other Clinician: Referring Provider: Treating Provider/Extender: Elyse Jarvis Weeks in Treatment: 33 Debridement Performed for Assessment: Wound #17 Right,Lateral Lower Leg Performed By: Physician Fredirick Maudlin, MD Debridement Type: Debridement Level of Consciousness (Pre-procedure): Awake and Alert Pre-procedure Verification/Time Out Yes - 11:44 Taken: Start Time: 11:44 Pain Control: Lidocaine 4% T opical Solution T Area Debrided (L x W): otal 5.5 (cm) x 4.8 (cm) = 26.4 (cm) Tissue and other material debrided: Non-Viable, Slough, Slough Level: Non-Viable Tissue Debridement Description: Selective/Open Wound Instrument: Curette Bleeding: Minimum Hemostasis Achieved: Pressure Procedural Pain: 0 Post Procedural Pain: 0 Response to Treatment: Procedure was tolerated well Level of Consciousness (Post- Awake and Alert procedure): Post Debridement Measurements of Total Wound Length: (cm) 5.5 Width: (cm) 4.8 Depth: (cm) 0.1 Volume: (cm) 2.073 Character of Wound/Ulcer Post Debridement: Improved Post Procedure Diagnosis Same as Pre-procedure Electronic Signature(s) Signed: 12/06/2021 12:47:40 PM By: Fredirick Maudlin MD FACS Signed: 12/06/2021 5:36:35 PM By: Sharyn Creamer RN, BSN Entered By: Sharyn Creamer on 12/06/2021 11:46:08 -------------------------------------------------------------------------------- Debridement Details Patient Name: Date of Service: [name] [name], [name] 12/06/2021 11:00 A M Medical Record Number: [id number] Patient Account Number: [id number] Date of Birth/Sex: Treating RN: 06-20-1970 (51 y.o. [name] Primary Care Provider: [name] Other Clinician: Referring Provider: Treating Provider/Extender: Elyse Jarvis Weeks in Treatment: 33 Debridement Performed for Assessment: Wound #22 Left,Medial Ankle Performed By: Physician Fredirick Maudlin, MD Debridement Type: Debridement Level of Consciousness (Pre-procedure): Awake and Alert Pre-procedure Verification/Time Out Yes - 11:44 Taken: Start Time: 11:44 Pain Control: Lidocaine 4% T opical Solution T Area Debrided (L x W): otal 0.2 (cm) x 0.2 (cm) = 0.04 (cm) Tissue and other material debrided: Non-Viable, Eschar, Slough, Slough Level: Non-Viable Tissue Debridement Description: Selective/Open Wound Instrument: Curette Bleeding: Minimum Hemostasis Achieved: Pressure Procedural Pain: 0 Post Procedural Pain: 0 Response to Treatment: Procedure was tolerated well Level of Consciousness (Post- Awake and Alert procedure): Post Debridement Measurements of Total Wound Length: (cm) 0.2 Width: (cm) 0.2 Depth: (cm) 0.1 Volume: (cm) 0.003 Character of Wound/Ulcer Post Debridement: Improved Post Procedure Diagnosis Same as Pre-procedure Electronic Signature(s) Signed: 12/06/2021 12:47:40 PM By: Fredirick Maudlin MD FACS Signed: 12/06/2021 5:36:35 PM By: Sharyn Creamer RN, BSN Entered By: Sharyn Creamer on 12/06/2021 11:47:29 -------------------------------------------------------------------------------- Debridement Details Patient Name: Date of Service: [name] [name], [name] 12/06/2021 11:00 A M Medical Record Number: [id number] Patient Account Number: [id number] Date of  Birth/Sex: Treating RN: [birth year] (51 y.o. [name] Primary Care Provider: [name] Other Clinician: Referring Provider: Treating Provider/Extender: Elyse Jarvis Weeks in Treatment: 33 Debridement Performed for Assessment: Wound #21 Right,Medial Ankle Performed By: Physician Fredirick Maudlin, MD Debridement Type: Debridement Severity of Tissue Pre Debridement: Fat layer exposed Level of Consciousness (Pre-procedure): Awake and Alert Pre-procedure Verification/Time Out Yes - 11:44 Taken: Start Time: 11:44 Pain Control: Lidocaine 4% T opical Solution T Area Debrided (L x W): otal 5.6 (cm) x 2.5 (cm) = 14 (cm) Tissue and other material debrided: Non-Viable, Slough, Slough Level: Non-Viable Tissue Debridement Description: Selective/Open Wound Instrument: Curette Bleeding: Minimum Hemostasis Achieved: Pressure Procedural Pain: 0 Post Procedural Pain: 0 Response to Treatment: Procedure was tolerated well Level of Consciousness (Post- Awake and Alert procedure): Post Debridement Measurements of Total Wound Length: (cm) 5.6 Width: (cm) 2.5 Depth: (cm) 0.1 Volume: (cm) 1.1 Character of Wound/Ulcer Post Debridement: Improved Severity of Tissue Post Debridement: Fat layer exposed Post Procedure Diagnosis Same as Pre-procedure Electronic Signature(s) Signed: 12/06/2021 12:47:40 PM By: Fredirick Maudlin  MD FACS Signed: 12/06/2021 5:36:35 PM By: Sharyn Creamer RN, BSN Entered By: Sharyn Creamer on 12/06/2021 11:48:13 -------------------------------------------------------------------------------- HPI Details Patient Name: Date of Service: [name] [name], [name] 12/06/2021 11:00 A M Medical Record Number: [id number] Patient Account Number: [id number] Date of Birth/Sex: Treating RN: [birth year] (51 y.o. [name] Primary Care Provider: [name] Other Clinician: Referring Provider: Treating Provider/Extender: Elyse Jarvis Weeks in Treatment: 27 History of Present Illness Location: medial and lateral ankle region on the right and left medial malleolus Quality: Patient reports experiencing a shooting pain to affected area(s). Severity: Patient states wound(s) are getting worse. Duration: right lower extremity bimalleolar ulcers have been present for approximately 2 years; the rright meedial malleolus ulcer has been there proximally 6 months Timing: Pain in wound is constant (hurts all the time) Context: The wound would happen gradually ssociated Signs and Symptoms: Patient reports having increase discharge. A HPI Description: 51 year old patient with a history of sickle cell anemia who was last seen by me with ulceration of the right lower extremity above the ankle and was referred to Dr. Leland Johns for a surgical debridement as I was unable to do anything in the office due to excruciating pain. At that stage she was referred from the plastic surgery service to dermatology who treated her for a skin infection with doxycycline and then Levaquin and a local antibiotic ointment. I understand the patient has since developed ulceration on the left ankle both medial and lateral and was now referred back to the wound center as dermatology has finished the management. I do not have any notes from the dermatology department Old notes: 51 year old patient with a history of sickle cell anemia, pain bilateral lower extremities, right lower extremity ulcer and has a history of receiving a skin graft( Theraskin) several months ago. She has been visiting the wound center [facility] and was seen by Dr. Dellia Nims and Dr. Leland Johns. after prolonged conservator therapy between July 2016 and January 2017. She had been seen by the plastic surgeon and taken to the OR for debridement and application of Theraskin. She had 3 applications of Theraskin and was then treated with collagen. Prior to that she had a history of similar  problems in 2014 and was treated conservatively. Had a reflux study done for the right lower extremity in August 2016 without reflux or DVT . Past medical history significant for sickle cell disease, anemia, leg ulcers, cholelithiasis,and has never been a smoker. Once the patient was discharged on the wound center she says within 2 or 3 weeks the problems recurred and she has been treating it conservatively. since I saw her 3 weeks ago at [facility] she has been unable to get her dressing material but has completed a course of doxycycline. 6/7/ 2017 -- lower extremity venous duplex reflux evaluation was done No evidence of SVT or DVT in the RLL. No venous incompetence in the RLL. No further vascular workup is indicated at this time. She was seen by Dr. [name], on 10/04/2015. She agreed with the plan of taking her to the OR for debridement and application of theraskin and would also take biopsies to rule out pyoderma gangrenosum. Follow-up note dated May 31 received and she was status post application of Theraskin to multiple ulcers around the right ankle. Pathology did not show evidence of malignancy or pyoderma gangrenosum. She would continue to see as in the wound clinic for further care and see Dr. Leland Johns as needed. The patient brought the biopsy  report and it was consistent with stasis ulcer no evidence of malignancy and the comment was that there was some adjacent neovascularization, fibrosis and patchy perivascular chronic inflammation. 11/15/2015 -- today we applied her first application of Theraskin 11/30/15; TheraSkin #2 12/13/2015 -- she is having a lot of pain locally and is here for possible application of a theraskin today. 01/16/2016 -- the patient has significant pain and has noticed despite in spite of all local care and oral pain medication. It is impossible to debride her in the office. 02/06/2016 -- I do not see any notes from Dr. Iran Planas( the patient has not made a  call to the office know as she heard from them) and the only visit to recently was with her PCP Dr. Danella Penton -- I saw her on 01/16/2016 and prescribed 90 tablets of oxycodone 10 mg and did lab work and screening for HIV. the HIV was negative and hemoglobin was 6.3 with a WBC count of 14.9 and hematocrit of 17.8 with platelets of 561. 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 The bimalleolar ulcers to the right lower extremity have been present for approximately 2 years, the left medial malleolus ulcer has been present approximately 6 months. She has followed with Dr.Thimmappa in the past and has had a total of 3 applications of Theraskin (01/2015, 09/2015, 06/17/16). She has also followed with Dr. Con Memos here in the clinic and has received 2 applications of TheraSkin (11/10/15, 11/30/15). The patient does experience chronic, and is not amenable to debridement. 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 She is not currently on any antibiotic therapy and has not been treated with any recently. 07/17/2016 -- was seen by Dr. Iran Planas of plastic surgery who saw her 2 weeks postop application of Theraskin #3. She had removed her dressing and asked her to apply silver alginate on alternate days and follow-up back with the wound center. Future debridements and application of skin substitute would have to be done in the hospital due to her high risk for anesthesia. READMISSION 04/17/2021 Patient is now a 51 year old woman that we have had in this clinic for a prolonged period of time and 2016-2017 and then again for 2 visits in February 2018. At that point she had wounds on the right lower leg predominantly medial. She had also been seen by plastic surgery Dr. Leland Johns who I believe took her to the OR for operative debridement and application of  TheraSkin in 2017. After she left our clinic she was followed for a very prolonged period of time in the wound care center in [facility] who then referred her ultimately to [facility] where she was seen by Dr. Vernona Rieger. Again taken her to the OR for skin grafting which apparently did not take. She had multiple other attempts at dressings although I have not really looked over all of these notes in great detail. She has not been seen in a wound care center in about a year. She states over the last year in addition to her right lower leg she has developed wounds on the left lower leg quite extensive. She is using Xeroform to all of these wounds without really any improvement. She also has Medicaid which does not cover wound products. The patient has had vascular work-ups in the past including most recently on 03/28/2021 showing biphasic waveforms on the right triphasic  at the PTA and biphasic at the dorsalis pedis on the left. She was unable to tolerate any degree of compression to do ABIs. Unfortunately TBI's were also not done. She had venous reflux studies done in 2017. This did not show any evidence of a DVT or SVT and no venous incompetence was noted in the right leg at the time this was the only side with the wound As noted I did not look all over her old records. She apparently had a course of HBO and [facility] although I am not sure what the indication would have been. In any case she developed seizures and terminated treatment earlier. She is generally much more disabled than when we last saw her in clinic. She can no longer walk pretty much wheelchair-bound because predominantly of pain in the left hip. 04/24/2021; the patient tolerated the wraps we put on. We used Santyl and Hydrofera Blue under compression. I brought her back for a nurse visit for a change in dressing. With Medicaid we will have a hard time getting anything paid for and hence the need for compression. She arrives in clinic with all the  wounds looking somewhat better in terms of surface 12/20; circumferential wound on the right from the lateral to the medial. She has open areas on the left medial and left lateral x2 on all of this with the same surface. This does not look completely healthy although she does have some epithelialization. She is not complaining of a lot of pain which is unusual for her sickle ulcers. I have not looked over her extensive records from [facility]. She had recent arterial studies and has a history of venous reflux studies I will need to look these over although I do not believe she has significant arterial disease 2023 05/22/2021; patient's wound areas measure slightly smaller. Still a lot of drainage coming from the right we have been using Hydrofera Blue and Santyl with some improvement in the wound surfaces. She tells me she will be getting transfused later in the week for her underlying sickle cell anemia I have looked over her recent arterial studies which were done in the fall. This was in November and showed biphasic and triphasic waveforms but she could not tolerate ABIs because of pressure and unfortunately TBI's were not done. She has not had recent venous reflux studies that I can see 1/10; not much change about the same surface area. This has a yellowish surface to it very gritty. We have been using Santyl and Hydrofera Blue for a prolonged period. Culture I did last week showed methicillin sensitive staph aureus "rare". Our intake nurse reports greenish drainage which may be the Hydrofera Blue itself 1/17; wounds are continue to measure smaller although I am not sure about the accuracy here. Especially the areas on the right are covered in what looks to be a nonviable surface although she does have some epithelialization. Similarly she has areas on the left medial and left lateral ankle area which appear to have a better surface and perhaps are slightly smaller. We have been using Santyl and  Hydrofera Blue. She cannot tolerate mechanical debridements She went for her reflux studies which showed significant reflux at the greater saphenous vein at the saphenofemoral junction as well as the greater saphenous vein in the proximal calf on the left she had reflux in the thigh and the common femoral vein and supra vein Fishel vein reflux in the greater saphenous vein. I will have vein and vascular look at  this. My thoughts have been that these are likely sickle wounds. I looked through her old records from [facility] wound care center and then when she graduated to [facility] wound care center where she saw Dr. Zigmund Daniel and Dr. Vernona Rieger. Although I can see she had reflux studies done I do not see that she actually saw a vein and vascular. I went over the fact that she had operative debridements and actual skin grafting that did not take. I do not think these wounds have ever really progressed towards healing 1/31;Substantial wounds on the right ankle area. Hyper granulated very gritty adherent debris on the surface. 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 On the left she has no DVT or SVT deep vein reflux in the common femoral vein. Superficial vein reflux in the greater saphenous vein on although the vein diameter is not really all that large. I do not think there is anything that can be done with these although I am going to send her for consultation to vein and vascular. 2/7; Wound exam; substantial wound area on the right posterior ankle area and areas on the left medial ankle and left lateral ankle. I was able to debride the  left medial ankle last week fairly aggressively and it is back this week to a completely nonviable surface She will see vascular surgery this Friday and I would like them to review the venous studies and also any comments on her arterial status. If they do not see an issue here I am going to refer her to plastic surgery for an operative debridement perhaps intraoperative ACell or Integra. Eventually she will require a deep tissue culture again 2/14; substantial wound area on the right posterior ankle, medial ankle. We have been using silver alginate The patient was seen by vein and vascular she had both venous reflux studies and arterial studies. 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 She had no evidence of superficial venous thrombosis. She did have superficial vein reflux at the saphenofemoral junction and the greater saphenous vein. On the left no evidence of a DVT no evidence of superficial venous throat thrombosis she did have deep vein reflux in the common femoral vein and superficial vein reflux in the greater saphenous vein but these were not felt to be amenable to ablation. In terms of arterial studies she had triphasic and wife biphasic wave waveforms bilaterally not felt to have a significant arterial issue. I do not get the feeling that they felt that any part of her nonhealing wounds were related to either arterial or venous issues. They did note that she had venous reflux at the right at the [facility] and [facility]. And also on the left there were reflux in the deep system at the common femoral vein and greater saphenous vein in the proximal thigh. Nothing amenable to ablation. 2/20; she is making some decent progress on the right where there is nice skin between the 2 open areas on the right ankle. The surfaces here do not look viable yet there  is some surrounding epithelialization. She still has a small area on the  left medial ankle area. Hyper-granulated Jody's away always 2/28 patient has an appointment with plastic surgery on 3/8. We will see her back on 3/9. She may have to call us to get the area redressed. We've been using Santyl under silver alginate. We made a nice improvement on the left medial ankle. The larger wounds on the right also looks somewhat better in terms of epithelialization although I think they could benefit from an aggressive debridement if plastic surgery would be willing to do that. Perhaps placement of Integra or a cell 07/26/2021: She saw Dr. Claudia Desanctis yesterday. He raised the question as to whether or not this might be pyoderma and wanted to wait until that question was answered by dermatology before proceeding with any sort of operative debridement. We have continue to use Santyl under silver alginate with Kerlix and Coban wraps. Overall, her wounds appear to be continuing to contract and epithelialize, with some granulation tissue present. There continues to be some slough on all wound surfaces. 08/09/2021: She has not been able to get an appointment with dermatology because apparently the offices in [place] do not accept Medicaid. She is looking into whether or not she can be seen at the main [facility] dermatology clinic. This is necessary because plastic surgery is concerned that her wounds might represent pyoderma and they did not want to do any procedure until that was clarified. We have been using Santyl under silver alginate with Kerlix and Coban wraps. Today, there was a greater amount of drainage on her dressings with a slight green discoloration and significant odor. Despite this, her wounds continue to contract and epithelialize. There is pale granulation tissue present and actually, on the left medial ankle, the granulation tissue is a bit hypertrophic. 08/16/2021: Last week, I took a culture and this grew back rare methicillin-resistant Staph aureus  and rare corynebacterium. The MRSA was sensitive to gentamicin which we began applying topically on an empiric basis. This week, her wounds are a bit smaller and the drainage and odor are less. Her primary care provider is working on assisting the patient with a dermatology evaluation. She has been in silver alginate over the gentamicin that was started last week along with Kerlix and Coban wraps. 08/23/2021: Because she has Medicaid, we have been unable to get her into see any dermatologist in the [place] to rule out pyoderma gangrenosum, which was a requirement from plastic surgery prior to any sort of debridement and grafting. Despite this, however, all of her wounds continue to get smaller. The wound on her left medial ankle is nearly closed. There is no odor from the wounds, although she still accumulates a modest amount of drainage on her dressings. 08/30/2021: The lateral right ankle wound and the medial left ankle wound are a bit smaller today. The medial right ankle wound is about the same size. They are less tender. We have still been unable to get her into dermatology. 09/06/2021: All of the wounds are about the same size today. She continues to endorse minimal pain. I communicated with Dr. Claudia Desanctis in plastic surgery regarding our issues getting a dermatology appointment; he was out of town but indicated that he would look into perhaps performing the biopsy in his office and will have his office contact her. 09/14/2021: The patient has an appointment in dermatology, but it is not until October. Her wounds are roughly the same; she continues to  have very thick purulent-looking drainage on her dressings. 09/20/2021: The left medial wound is nearly closed and just has a bit of accumulated eschar on the surface. The right medial and lateral ankle wounds are perhaps a little bit smaller. They continue to have a very pale surface with accumulation of thin slough. PCR culture done last week returned with MRSA  but fairly low levels. I did not think [employer] was indicated based on this. She is getting topical mupirocin with Prisma silver collagen. 10/04/2021: The patient was not seen in clinic last week due to childcare coverage issues. In the interim, the left medial leg wound has closed. The right sided leg wounds are smaller. There is more granulation tissue coming through, particularly on the lateral wound. The surface remains somewhat gritty. We have been applying topical mupirocin and Prisma silver collagen. 10/11/2021: The left medial leg wound remains closed. She does complain of some anesthetic sensation to the area. Both of the right-sided leg wounds are smaller but still have accumulated slough. 10/18/2021: Both right-sided leg wounds are minimally smaller this week. She still continues to accumulate slough and has thick drainage on her dressings. 10/23/2021: Both wounds continue to contract. There is still slough buildup. She has been approved for a keratin-based skin substitute trial product but it will not be available until next week. 10/30/2021: The wounds are about the same to perhaps slightly smaller. There is still continued slough buildup. Unfortunately, the rep for the keratin based product did not show up today and did not answer his phone when called. 11/08/2021: The wounds are little bit smaller today. She continues to have thick drainage but the surfaces are relatively clean with just a little bit of slough accumulation. She reported to me today that she is unable to completely flex her left ankle and on examination it seems this is potentially related to scar tissue from her wounds. We do have the ProgenaMatrix trial product available for her today. 11/15/2021: Both wounds are smaller today. There is some slough accumulation on the surfaces, but the medial wound, in particular looks like it is filling in and is less deep. She did hear from physical therapy and she is going to start working  with them on July 11. She is here for her second application of the trial skin substitute, ProgenaMatrix. 11/22/2021: Both wounds continue to contract, the medial more dramatically than the lateral. Both wounds have a layer of slough on the surface, but underneath this, the gritty fibrous tissue has a little bit more of a pink cast to it rather than being as pale as it has been. 11/29/2021: The wounds are roughly the same size this week, perhaps a millimeter or 2 smaller. The medial wound has filled in and is nearly flush with the surrounding skin surface. She continues to have a lot of slough accumulation on both surfaces. 12/06/2021: No significant change to her wounds, but she has a new opening on her dorsal foot, just distal to the right lateral ankle wound. The area on her left medial ankle that reopened looks a little bit larger today. She has quite a bit of pain associated with the new wound. Electronic Signature(s) Signed: 12/06/2021 12:35:48 PM By: Fredirick Maudlin MD FACS Entered By: Fredirick Maudlin on 12/06/2021 12:35:48 -------------------------------------------------------------------------------- Physical Exam Details Patient Name: Date of Service: [name] [name], [name] 12/06/2021 11:00 A M Medical Record Number: [id number] Patient Account Number: [id number] Date of Birth/Sex: Treating RN: 06/17/1970 (50 y.o. F) Dellie Catholic Primary Care  Provider: [name] Other Clinician: Referring Provider: Treating Provider/Extender: Elyse Jarvis Weeks in Treatment: 33 Constitutional . . . . No acute distress.Marland Kitchen Respiratory Normal work of breathing on room air.. Notes 12/06/2021: No significant change to her wounds, but she has a new opening on her dorsal foot, just distal to the right lateral ankle wound. The area on her left medial ankle that reopened looks a little bit larger today. She has quite a bit of pain associated with the new wound. Electronic  Signature(s) Signed: 12/06/2021 12:36:20 PM By: Fredirick Maudlin MD FACS Entered By: Fredirick Maudlin on 12/06/2021 12:36:20 -------------------------------------------------------------------------------- Physician Orders Details Patient Name: Date of Service: [name] [name], [name] 12/06/2021 11:00 A M Medical Record Number: [id number] Patient Account Number: [id number] Date of Birth/Sex: Treating RN: May 10, 1971 (51 y.o. [name] Primary Care Provider: [name] Other Clinician: Referring Provider: Treating Provider/Extender: Elyse Jarvis Weeks in Treatment: 80 Verbal / Phone Orders: No Diagnosis Coding ICD-10 Coding Code Description L97.818 Non-pressure chronic ulcer of other part of right lower leg with other specified severity L97.828 Non-pressure chronic ulcer of other part of left lower leg with other specified severity 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 Follow-up Appointments ppointment in 1 week. - Dr. Celine Ahr - Room 3 Return A Other: - PROGENA MATRIX #4 Cellular or Tissue Based Products Other Cellular or Tissue Based Products Orders/Instructions: - PROGENA MATRIX#3 Bathing/ Shower/ Hygiene May shower with protection but do not get wound dressing(s) wet. - Can get cast protector bags at [facility] or [facility] Edema Control - Lymphedema / SCD / Other Elevate legs to the level of the heart or above for 30 minutes daily and/or when sitting, a frequency of: - throughout the day Avoid standing for long periods of time. Exercise regularly Additional Orders / Instructions Follow Nutritious Diet Wound Treatment Wound #17 - Lower Leg Wound Laterality: Right, Lateral Cleanser: Soap and Water 1 x Per Week/30 Days Discharge Instructions: May shower and wash wound with dial antibacterial soap and water prior to dressing change. Cleanser: Wound Cleanser 1 x Per Week/30 Days Discharge Instructions: Cleanse the wound with wound cleanser prior to applying a  clean dressing using gauze sponges, not tissue or cotton balls. Peri-Wound Care: Triamcinolone 15 (g) 1 x Per Week/30 Days Discharge Instructions: Use triamcinolone 15 (g) as directed Peri-Wound Care: Sween Lotion (Moisturizing lotion) 1 x Per Week/30 Days Discharge Instructions: Apply moisturizing lotion as directed Topical: Mupirocin Ointment 1 x Per Week/30 Days Discharge Instructions: Apply Mupirocin (Bactroban) as instructed Prim Dressing: [place] ary 1 x Per Week/30 Days Secondary Dressing: ABD Pad, 5x9 1 x Per Week/30 Days Discharge Instructions: Apply over primary dressing as directed. Secondary Dressing: Zetuvit Plus 4x8 in 1 x Per Week/30 Days Discharge Instructions: Apply over primary dressing as directed. Compression Wrap: Kerlix Roll 4.5x3.1 (in/yd) 1 x Per Week/30 Days Discharge Instructions: Apply Kerlix and Coban compression as directed. Compression Wrap: Coban Self-Adherent Wrap 4x5 (in/yd) 1 x Per Week/30 Days Discharge Instructions: Apply over Kerlix as directed. Wound #21 - Ankle Wound Laterality: Right, Medial Cleanser: Soap and Water 1 x Per Week/30 Days Discharge Instructions: May shower and wash wound with dial antibacterial soap and water prior to dressing change. Cleanser: Wound Cleanser 1 x Per Week/30 Days Discharge Instructions: Cleanse the wound with wound cleanser prior to applying a clean dressing using gauze sponges, not tissue or cotton balls. Peri-Wound Care: Triamcinolone 15 (g) 1 x Per Week/30 Days Discharge Instructions: Use triamcinolone 15 (g) as directed  Peri-Wound Care: Sween Lotion (Moisturizing lotion) 1 x Per Week/30 Days Discharge Instructions: Apply moisturizing lotion as directed Topical: Mupirocin Ointment 1 x Per Week/30 Days Discharge Instructions: Apply Mupirocin (Bactroban) as instructed Prim Dressing: [place] ary 1 x Per Week/30 Days Secondary Dressing: ABD Pad, 5x9 1 x Per Week/30 Days Discharge Instructions: Apply  over primary dressing as directed. Secondary Dressing: Zetuvit Plus 4x8 in 1 x Per Week/30 Days Discharge Instructions: Apply over primary dressing as directed. Compression Wrap: Kerlix Roll 4.5x3.1 (in/yd) 1 x Per Week/30 Days Discharge Instructions: Apply Kerlix and Coban compression as directed. Compression Wrap: Coban Self-Adherent Wrap 4x5 (in/yd) 1 x Per Week/30 Days Discharge Instructions: Apply over Kerlix as directed. Wound #22 - Ankle Wound Laterality: Left, Medial Cleanser: Soap and Water Discharge Instructions: May shower and wash wound with dial antibacterial soap and water prior to dressing change. Topical: Mupirocin Ointment Discharge Instructions: Apply Mupirocin (Bactroban) as instructed Prim Dressing: KerraCel Ag Gelling Fiber Dressing, 2x2 in (silver alginate) ary Discharge Instructions: Apply silver alginate to wound bed as instructed Secondary Dressing: Bordered Gauze, 2x2 in Discharge Instructions: Apply over primary dressing as directed. Electronic Signature(s) Signed: 12/06/2021 12:47:40 PM By: Fredirick Maudlin MD FACS Entered By: Fredirick Maudlin on 12/06/2021 12:36:40 -------------------------------------------------------------------------------- Problem List Details Patient Name: Date of Service: [name] [name], [name] 12/06/2021 11:00 A M Medical Record Number: [id number] Patient Account Number: [id number] Date of Birth/Sex: Treating RN: July 11, 1970 (51 y.o. [name] Primary Care Provider: [name] Other Clinician: Referring Provider: Treating Provider/Extender: Elyse Jarvis Weeks in Treatment: 84 Active Problems ICD-10 Encounter Code Description Active Date MDM Diagnosis L97.818 Non-pressure chronic ulcer of other part of right lower leg with other specified 04/17/2021 No Yes severity L97.828 Non-pressure chronic ulcer of other part of left lower leg with other specified 04/17/2021 No Yes severity D57.1 Sickle-cell  disease without crisis 04/17/2021 No Yes Inactive Problems Resolved Problems Electronic Signature(s) Signed: 12/06/2021 12:19:47 PM By: Fredirick Maudlin MD FACS Entered By: Fredirick Maudlin on 12/06/2021 12:19:47 -------------------------------------------------------------------------------- Progress Note Details Patient Name: Date of Service: [name] [name], [name] 12/06/2021 11:00 A M Medical Record Number: [id number] Patient Account Number: [id number] Date of Birth/Sex: Treating RN: [birth year] (51 y.o. [name] Primary Care Provider: [name] Other Clinician: Referring Provider: Treating Provider/Extender: Elyse Jarvis Weeks in Treatment: 75 Subjective Chief Complaint Information obtained from Patient the patient is here for evaluation of her bilateral lower extremity sickle cell ulcers 04/17/2021; patient comes in for substantial wounds on the right and left lower leg History of Present Illness (HPI) The following HPI elements were documented for the patient's wound: Location: medial and lateral ankle region on the right and left medial malleolus Quality: Patient reports experiencing a shooting pain to affected area(s). Severity: Patient states wound(s) are getting worse. Duration: right lower extremity bimalleolar ulcers have been present for approximately 2 years; the rright meedial malleolus ulcer has been there proximally 6 months Timing: Pain in wound is constant (hurts all the time) Context: The wound would happen gradually Associated Signs and Symptoms: Patient reports having increase discharge. 51 year old patient with a history of sickle cell anemia who was last seen by me with ulceration of the right lower extremity above the ankle and was referred to Dr. Leland Johns for a surgical debridement as I was unable to do anything in the office due to excruciating pain. At that stage she was referred from the plastic surgery service to dermatology  who treated her for a skin  infection with doxycycline and then Levaquin and a local antibiotic ointment. I understand the patient has since developed ulceration on the left ankle both medial and lateral and was now referred back to the wound center as dermatology has finished the management. I do not have any notes from the dermatology department Old notes: 51 year old patient with a history of sickle cell anemia, pain bilateral lower extremities, right lower extremity ulcer and has a history of receiving a skin graft( Theraskin) several months ago. She has been visiting the wound center [facility] and was seen by Dr. Dellia Nims and Dr. Leland Johns. after prolonged conservator therapy between July 2016 and January 2017. She had been seen by the plastic surgeon and taken to the OR for debridement and application of Theraskin. She had 3 applications of Theraskin and was then treated with collagen. Prior to that she had a history of similar problems in 2014 and was treated conservatively. Had a reflux study done for the right lower extremity in August 2016 without reflux or DVT . Past medical history significant for sickle cell disease, anemia, leg ulcers, cholelithiasis,and has never been a smoker. Once the patient was discharged on the wound center she says within 2 or 3 weeks the problems recurred and she has been treating it conservatively. since I saw her 3 weeks ago at [facility] she has been unable to get her dressing material but has completed a course of doxycycline. 6/7/ 2017 -- lower extremity venous duplex reflux evaluation was done oo No evidence of SVT or DVT in the RLL. No venous incompetence in the RLL. No further vascular workup is indicated at this time. She was seen by Dr. [name], on 10/04/2015. She agreed with the plan of taking her to the OR for debridement and application of theraskin and would also take biopsies to rule out pyoderma gangrenosum. Follow-up note dated May 31  received and she was status post application of Theraskin to multiple ulcers around the right ankle. Pathology did not show evidence of malignancy or pyoderma gangrenosum. She would continue to see as in the wound clinic for further care and see Dr. Leland Johns as needed. The patient brought the biopsy report and it was consistent with stasis ulcer no evidence of malignancy and the comment was that there was some adjacent neovascularization, fibrosis and patchy perivascular chronic inflammation. 11/15/2015 -- today we applied her first application of Theraskin 11/30/15; TheraSkin #2 12/13/2015 -- she is having a lot of pain locally and is here for possible application of a theraskin today. 01/16/2016 -- the patient has significant pain and has noticed despite in spite of all local care and oral pain medication. It is impossible to debride her in the office. 02/06/2016 -- I do not see any notes from Dr. Iran Planas( the patient has not made a call to the office know as she heard from them) and the only visit to recently was with her PCP Dr. Danella Penton -- I saw her on 01/16/2016 and prescribed 90 tablets of oxycodone 10 mg and did lab work and screening for HIV. the HIV was negative and hemoglobin was 6.3 with a WBC count of 14.9 and hematocrit of 17.8 with platelets of 561. 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 The bimalleolar ulcers to the right lower extremity have been present for approximately 2 years, the left medial malleolus ulcer has been present approximately 6 months. She has followed  with Dr.Thimmappa in the past and has had a total of 3 applications of Theraskin (01/2015, 09/2015, 06/17/16). She has also followed with Dr. Con Memos here in the clinic and has received 2 applications of TheraSkin (11/10/15, 11/30/15). The patient does experience chronic, and is not amenable to  debridement. 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 She is not currently on any antibiotic therapy and has not been treated with any recently. 07/17/2016 -- was seen by Dr. Iran Planas of plastic surgery who saw her 2 weeks postop application of Theraskin #3. She had removed her dressing and asked her to apply silver alginate on alternate days and follow-up back with the wound center. Future debridements and application of skin substitute would have to be done in the hospital due to her high risk for anesthesia. READMISSION 04/17/2021 Patient is now a 51 year old woman that we have had in this clinic for a prolonged period of time and 2016-2017 and then again for 2 visits in February 2018. At that point she had wounds on the right lower leg predominantly medial. She had also been seen by plastic surgery Dr. Leland Johns who I believe took her to the OR for operative debridement and application of TheraSkin in 2017. After she left our clinic she was followed for a very prolonged period of time in the wound care center in [facility] who then referred her ultimately to [facility] where she was seen by Dr. Vernona Rieger. Again taken her to the OR for skin grafting which apparently did not take. She had multiple other attempts at dressings although I have not really looked over all of these notes in great detail. She has not been seen in a wound care center in about a year. She states over the last year in addition to her right lower leg she has developed wounds on the left lower leg quite extensive. She is using Xeroform to all of these wounds without really any improvement. She also has Medicaid which does not cover wound products. The patient has had vascular work-ups in the past including most recently on 03/28/2021 showing biphasic waveforms on the right triphasic at the PTA and biphasic at the dorsalis pedis on the left. She was unable to tolerate any degree of compression  to do ABIs. Unfortunately TBI's were also not done. She had venous reflux studies done in 2017. This did not show any evidence of a DVT or SVT and no venous incompetence was noted in the right leg at the time this was the only side with the wound As noted I did not look all over her old records. She apparently had a course of HBO and [facility] although I am not sure what the indication would have been. In any case she developed seizures and terminated treatment earlier. She is generally much more disabled than when we last saw her in clinic. She can no longer walk pretty much wheelchair-bound because predominantly of pain in the left hip. 04/24/2021; the patient tolerated the wraps we put on. We used Santyl and Hydrofera Blue under compression. I brought her back for a nurse visit for a change in dressing. With Medicaid we will have a hard time getting anything paid for and hence the need for compression. She arrives in clinic with all the wounds looking somewhat better in terms of surface 12/20; circumferential wound on the right from the lateral to the medial. She has open areas on the left medial and left lateral  x2 on all of this with the same surface. This does not look completely healthy although she does have some epithelialization. She is not complaining of a lot of pain which is unusual for her sickle ulcers. I have not looked over her extensive records from [facility]. She had recent arterial studies and has a history of venous reflux studies I will need to look these over although I do not believe she has significant arterial disease 2023 05/22/2021; patient's wound areas measure slightly smaller. Still a lot of drainage coming from the right we have been using Hydrofera Blue and Santyl with some improvement in the wound surfaces. She tells me she will be getting transfused later in the week for her underlying sickle cell anemia I have looked over her recent arterial studies which were done in  the fall. This was in November and showed biphasic and triphasic waveforms but she could not tolerate ABIs because of pressure and unfortunately TBI's were not done. She has not had recent venous reflux studies that I can see 1/10; not much change about the same surface area. This has a yellowish surface to it very gritty. We have been using Santyl and Hydrofera Blue for a prolonged period. Culture I did last week showed methicillin sensitive staph aureus "rare". Our intake nurse reports greenish drainage which may be the Hydrofera Blue itself 1/17; wounds are continue to measure smaller although I am not sure about the accuracy here. Especially the areas on the right are covered in what looks to be a nonviable surface although she does have some epithelialization. Similarly she has areas on the left medial and left lateral ankle area which appear to have a better surface and perhaps are slightly smaller. We have been using Santyl and Hydrofera Blue. She cannot tolerate mechanical debridements She went for her reflux studies which showed significant reflux at the greater saphenous vein at the saphenofemoral junction as well as the greater saphenous vein in the proximal calf on the left she had reflux in the thigh and the common femoral vein and supra vein Fishel vein reflux in the greater saphenous vein. I will have vein and vascular look at this. My thoughts have been that these are likely sickle wounds. I looked through her old records from [facility] wound care center and then when she graduated to [facility] wound care center where she saw Dr. Zigmund Daniel and Dr. Vernona Rieger. Although I can see she had reflux studies done I do not see that she actually saw a vein and vascular. I went over the fact that she had operative debridements and actual skin grafting that did not take. I do not think these wounds have ever really progressed towards healing 1/31;Substantial wounds on the right ankle area. Hyper  granulated very gritty adherent debris on the surface. 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 On the left she has no DVT or SVT deep vein reflux in the common femoral vein. Superficial vein reflux in the greater saphenous vein on although the vein diameter is not really all that large. I do not think there is anything that can be  done with these although I am going to send her for consultation to vein and vascular. 2/7; Wound exam; substantial wound area on the right posterior ankle area and areas on the left medial ankle and left lateral ankle. I was able to debride the left medial ankle last week fairly aggressively and it is back this week to a completely nonviable surface She will see vascular surgery this Friday and I would like them to review the venous studies and also any comments on her arterial status. If they do not see an issue here I am going to refer her to plastic surgery for an operative debridement perhaps intraoperative ACell or Integra. Eventually she will require a deep tissue culture again 2/14; substantial wound area on the right posterior ankle, medial ankle. We have been using silver alginate The patient was seen by vein and vascular she had both venous reflux studies and arterial studies. 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 She had no evidence of superficial venous thrombosis. She did have superficial vein reflux at the saphenofemoral junction and the greater saphenous vein. On the left no evidence of a DVT no evidence of superficial  venous throat thrombosis she did have deep vein reflux in the common femoral vein and superficial vein reflux in the greater saphenous vein but these were not felt to be amenable to ablation. In terms of arterial studies she had triphasic and wife biphasic wave waveforms bilaterally not felt to have a significant arterial issue. I do not get the feeling that they felt that any part of her nonhealing wounds were related to either arterial or venous issues. They did note that she had venous reflux at the right at the [facility] and [facility]. And also on the left there were reflux in the deep system at the common femoral vein and greater saphenous vein in the proximal thigh. Nothing amenable to ablation. 2/20; she is making some decent progress on the right where there is nice skin between the 2 open areas on the right ankle. The surfaces here do not look viable yet there is some surrounding epithelialization. She still has a small area on the left medial ankle area. Hyper-granulated Jody's away always 2/28 patient has an appointment with plastic surgery on 3/8. We will see her back on 3/9. She may have to call us to get the area redressed. We've been using Santyl under silver alginate. We made a nice improvement on the left medial ankle. The larger wounds on the right also looks somewhat better in terms of epithelialization although I think they could benefit from an aggressive debridement if plastic surgery would be willing to do that. Perhaps placement of Integra or a cell 07/26/2021: She saw Dr. Claudia Desanctis yesterday. He raised the question as to whether or not this might be pyoderma and wanted to wait until that question was answered by dermatology before proceeding with any sort of operative debridement. We have continue to use Santyl under silver alginate with Kerlix and Coban wraps. Overall, her wounds appear to be continuing to contract and epithelialize, with some granulation tissue present. There continues to be  some slough on all wound surfaces. 08/09/2021: She has not been able to get an appointment with dermatology because apparently the offices in [place] do not accept Medicaid. She is looking into whether or not she can be seen at the main [facility] dermatology  clinic. This is necessary because plastic surgery is concerned that her wounds might represent pyoderma and they did not want to do any procedure until that was clarified. We have been using Santyl under silver alginate with Kerlix and Coban wraps. Today, there was a greater amount of drainage on her dressings with a slight green discoloration and significant odor. Despite this, her wounds continue to contract and epithelialize. There is pale granulation tissue present and actually, on the left medial ankle, the granulation tissue is a bit hypertrophic. 08/16/2021: Last week, I took a culture and this grew back rare methicillin-resistant Staph aureus and rare corynebacterium. The MRSA was sensitive to gentamicin which we began applying topically on an empiric basis. This week, her wounds are a bit smaller and the drainage and odor are less. Her primary care provider is working on assisting the patient with a dermatology evaluation. She has been in silver alginate over the gentamicin that was started last week along with Kerlix and Coban wraps. 08/23/2021: Because she has Medicaid, we have been unable to get her into see any dermatologist in the [place] to rule out pyoderma gangrenosum, which was a requirement from plastic surgery prior to any sort of debridement and grafting. Despite this, however, all of her wounds continue to get smaller. The wound on her left medial ankle is nearly closed. There is no odor from the wounds, although she still accumulates a modest amount of drainage on her dressings. 08/30/2021: The lateral right ankle wound and the medial left ankle wound are a bit smaller today. The medial right ankle wound  is about the same size. They are less tender. We have still been unable to get her into dermatology. 09/06/2021: All of the wounds are about the same size today. She continues to endorse minimal pain. I communicated with Dr. Claudia Desanctis in plastic surgery regarding our issues getting a dermatology appointment; he was out of town but indicated that he would look into perhaps performing the biopsy in his office and will have his office contact her. 09/14/2021: The patient has an appointment in dermatology, but it is not until October. Her wounds are roughly the same; she continues to have very thick purulent-looking drainage on her dressings. 09/20/2021: The left medial wound is nearly closed and just has a bit of accumulated eschar on the surface. The right medial and lateral ankle wounds are perhaps a little bit smaller. They continue to have a very pale surface with accumulation of thin slough. 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 I did not think [employer] was indicated based on this. She is getting topical mupirocin with Prisma silver collagen. 10/04/2021: The patient was not seen in clinic last week due to childcare coverage issues. In the interim, the left medial leg wound has closed. The right sided leg wounds are smaller. There is more granulation tissue coming through, particularly on the lateral wound. The surface remains somewhat gritty. We have been applying topical mupirocin and Prisma silver collagen. 10/11/2021: The left medial leg wound remains closed. She does complain of some anesthetic sensation to the area. Both of the right-sided leg wounds are smaller but still have accumulated slough. 10/18/2021: Both right-sided leg wounds are minimally smaller this week. She still continues to accumulate slough and has thick drainage on her dressings. 10/23/2021: Both wounds continue to contract. There is still slough buildup. She has been approved for a keratin-based skin  substitute trial product but it will not be available until  next week. 10/30/2021: The wounds are about the same to perhaps slightly smaller. There is still continued slough buildup. Unfortunately, the rep for the keratin based product did not show up today and did not answer his phone when called. 11/08/2021: The wounds are little bit smaller today. She continues to have thick drainage but the surfaces are relatively clean with just a little bit of slough accumulation. She reported to me today that she is unable to completely flex her left ankle and on examination it seems this is potentially related to scar tissue from her wounds. We do have the ProgenaMatrix trial product available for her today. 11/15/2021: Both wounds are smaller today. There is some slough accumulation on the surfaces, but the medial wound, in particular looks like it is filling in and is less deep. She did hear from physical therapy and she is going to start working with them on July 11. She is here for her second application of the trial skin substitute, ProgenaMatrix. 11/22/2021: Both wounds continue to contract, the medial more dramatically than the lateral. Both wounds have a layer of slough on the surface, but underneath this, the gritty fibrous tissue has a little bit more of a pink cast to it rather than being as pale as it has been. 11/29/2021: The wounds are roughly the same size this week, perhaps a millimeter or 2 smaller. The medial wound has filled in and is nearly flush with the surrounding skin surface. She continues to have a lot of slough accumulation on both surfaces. 12/06/2021: No significant change to her wounds, but she has a new opening on her dorsal foot, just distal to the right lateral ankle wound. The area on her left medial ankle that reopened looks a little bit larger today. She has quite a bit of pain associated with the new wound. Patient History Information obtained from Patient. Family  History Diabetes - Mother, Lung Disease - Mother, No family history of Cancer, Heart Disease, Hereditary Spherocytosis, Hypertension, Kidney Disease, Seizures, Stroke, Thyroid Problems, Tuberculosis. Social History Never smoker, Marital Status - Married, Alcohol Use - Never, Drug Use - No History, Caffeine Use - Daily. Medical History Eyes Denies history of Cataracts, Glaucoma, Optic Neuritis Ear/Nose/Mouth/Throat Denies history of Chronic sinus problems/congestion, Middle ear problems Hematologic/Lymphatic Patient has history of Anemia, Sickle Cell Disease Denies history of Hemophilia, Human Immunodeficiency Virus, Lymphedema Respiratory Denies history of Aspiration, Asthma, Chronic Obstructive Pulmonary Disease (COPD), Pneumothorax, Sleep Apnea, Tuberculosis Cardiovascular Denies history of Angina, Arrhythmia, Congestive Heart Failure, Coronary Artery Disease, Deep Vein Thrombosis, Hypertension, Hypotension, Myocardial Infarction, Peripheral Arterial Disease, Peripheral Venous Disease, Phlebitis, Vasculitis Gastrointestinal Denies history of Cirrhosis , Colitis, Crohnoos, Hepatitis A, Hepatitis B, Hepatitis C Endocrine Denies history of Type I Diabetes, Type II Diabetes Genitourinary Denies history of End Stage Renal Disease Immunological Denies history of Lupus Erythematosus, Raynaudoos, Scleroderma Integumentary (Skin) Denies history of History of Burn Musculoskeletal Denies history of Gout, Rheumatoid Arthritis, Osteoarthritis, Osteomyelitis Neurologic Patient has history of Neuropathy - right foot intermittant Denies history of Dementia, Quadriplegia, Paraplegia, Seizure Disorder Oncologic Denies history of Received Chemotherapy, Received Radiation Psychiatric Denies history of Anorexia/bulimia, Confinement Anxiety Hospitalization/Surgery History - c section x2. - left breast lumpectomy. - iandD right ankle with theraskin. Medical A Surgical History  Notes nd Constitutional Symptoms (General Health) H/O miscarriage Cardiovascular bradycardia Gastrointestinal cholilithiasis Objective Constitutional No acute distress.. Vitals Time Taken: 11:22 AM, Height: 67 in, Temperature: 98.2 F, Pulse: 97 bpm, Respiratory Rate: 16 breaths/min, Blood Pressure: 129/66 mmHg. Respiratory Normal  work of breathing on room air.. General Notes: 12/06/2021: No significant change to her wounds, but she has a new opening on her dorsal foot, just distal to the right lateral ankle wound. The area on her left medial ankle that reopened looks a little bit larger today. She has quite a bit of pain associated with the new wound. Integumentary (Hair, Skin) Wound #17 status is Open. Original cause of wound was Gradually Appeared. The date acquired was: 10/05/2012. The wound has been in treatment 33 weeks. The wound is located on the Right,Lateral Lower Leg. The wound measures 5.5cm length x 4.8cm width x 0.1cm depth; 20.735cm^2 area and 2.073cm^3 volume. There is Fat Layer (Subcutaneous Tissue) exposed. There is no tunneling or undermining noted. There is a medium amount of purulent drainage noted. The wound margin is flat and intact. There is small (1-33%) pink, pale granulation within the wound bed. There is a large (67-100%) amount of necrotic tissue within the wound bed including Adherent Slough. Wound #21 status is Open. Original cause of wound was Gradually Appeared. The date acquired was: 06/26/2021. The wound has been in treatment 23 weeks. The wound is located on the Right,Medial Ankle. The wound measures 5.6cm length x 2.5cm width x 0.1cm depth; 10.996cm^2 area and 1.1cm^3 volume. There is Fat Layer (Subcutaneous Tissue) exposed. There is no tunneling or undermining noted. There is a medium amount of purulent drainage noted. The wound margin is distinct with the outline attached to the wound base. There is small (1-33%) pink granulation within the wound bed.  There is a large (67-100%) amount of necrotic tissue within the wound bed including Adherent Slough. Wound #22 status is Open. Original cause of wound was Gradually Appeared. The date acquired was: 11/29/2021. The wound has been in treatment 1 weeks. The wound is located on the Left,Medial Ankle. The wound measures 0.2cm length x 0.2cm width x 0.1cm depth; 0.031cm^2 area and 0.003cm^3 volume. There is Fat Layer (Subcutaneous Tissue) exposed. There is no tunneling or undermining noted. There is a medium amount of serosanguineous drainage noted. There is small (1-33%) pink granulation within the wound bed. There is a large (67-100%) amount of necrotic tissue within the wound bed including Adherent Slough. 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 There was a Selective/Open Wound Non-Viable Tissue Debridement with a total area of 26.4 sq cm performed by Fredirick Maudlin, MD. With the following instrument(s): Curette to remove Non-Viable tissue/material. Material removed includes [facility] after achieving pain control using Lidocaine 4% Topical Solution. No specimens were taken. A time out was conducted at 11:44, prior to the start of the procedure. A Minimum amount of bleeding was controlled with Pressure. The procedure was tolerated well with a pain level of 0 throughout and a pain level of 0 following the procedure. Post Debridement Measurements: 5.5cm length x 4.8cm width x 0.1cm depth; 2.073cm^3 volume. Character of Wound/Ulcer Post Debridement is improved. Post procedure Diagnosis Wound #17: Same as Pre-Procedure Pre-procedure diagnosis of Wound #17 is a Sickle Cell Lesion  located on the Right,Lateral Lower Leg. A skin graft procedure using a bioengineered skin substitute/cellular or tissue based product was performed by Fredirick Maudlin, MD with the following instrument(s): Curette. Other was applied and  secured with Steri-Strips. 5.5 sq cm of product was utilized and 0.5 sq cm was wasted due to fit to size. Post Application, adaptic was applied. A Time Out was conducted at 11:44, prior to the start of the procedure. The procedure was tolerated well with a pain level of 0 throughout and a pain level of 0 following the procedure. Post procedure Diagnosis Wound #17: Same as Pre-Procedure General Notes: Progena matrix*** trial***. Wound #21 Pre-procedure diagnosis of Wound #21 is a Sickle Cell Lesion located on the Right,Medial Ankle .Severity of Tissue Pre Debridement is: Fat layer exposed. There was a Selective/Open Wound Non-Viable Tissue Debridement with a total area of 14 sq cm performed by Fredirick Maudlin, MD. With the following instrument(s): Curette to remove Non-Viable tissue/material. Material removed includes [facility] after achieving pain control using Lidocaine 4% Topical Solution. No specimens were taken. A time out was conducted at 11:44, prior to the start of the procedure. A Minimum amount of bleeding was controlled with Pressure. The procedure was tolerated well with a pain level of 0 throughout and a pain level of 0 following the procedure. Post Debridement Measurements: 5.6cm length x 2.5cm width x 0.1cm depth; 1.1cm^3 volume. Character of Wound/Ulcer Post Debridement is improved. Severity of Tissue Post Debridement is: Fat layer exposed. Post procedure Diagnosis Wound #21: Same as Pre-Procedure Pre-procedure diagnosis of Wound #21 is a Sickle Cell Lesion located on the Right,Medial Ankle. A skin graft procedure using a bioengineered skin substitute/cellular or tissue based product was performed by Fredirick Maudlin, MD with the following instrument(s):  Curette. Other was applied and secured with Steri-Strips. 4 sq cm of product was utilized and 0 sq cm was wasted. Post Application, adaptic was applied. A Time Out was conducted at 11:44, prior to the start of the procedure. The procedure was tolerated well with a pain level of 0 throughout and a pain level of 0 following the procedure. Post procedure Diagnosis Wound #21: Same as Pre-Procedure General Notes: Progena matrix**** trial***. Wound #22 Pre-procedure diagnosis of Wound #22 is a Lesion located on the Left,Medial Ankle . There was a Selective/Open Wound Non-Viable Tissue Debridement with a total area of 0.04 sq cm performed by Fredirick Maudlin, MD. With the following instrument(s): Curette to remove Non-Viable tissue/material. Material removed includes Eschar and Slough and after achieving pain control using Lidocaine 4% T opical Solution. No specimens were taken. A time out was conducted at 11:44, prior to the start of the procedure. A Minimum amount of bleeding was controlled with Pressure. The procedure was tolerated well with a pain level of 0 throughout and a pain level of 0 following the procedure. Post Debridement Measurements: 0.2cm length x 0.2cm width x 0.1cm depth; 0.003cm^3 volume. Character of Wound/Ulcer Post Debridement is improved. Post procedure Diagnosis Wound #22: Same as Pre-Procedure Plan Follow-up Appointments: Return Appointment in 1 week. - Dr. Celine Ahr - Room 3 Other: - PROGENA MATRIX #4 Cellular or Tissue Based Products: Other Cellular or Tissue Based Products Orders/Instructions: - PROGENA MATRIX#3 Bathing/ Shower/ Hygiene: May shower with protection but do not get wound dressing(s) wet. - Can get cast protector bags at [facility] or [facility] Edema Control - Lymphedema / SCD / Other: Elevate legs to the level of the heart or above for 30 minutes daily and/or when sitting, a frequency of: - throughout the day Avoid standing for long periods of time. Exercise  regularly Additional Orders / Instructions: Follow Nutritious Diet WOUND #17: - Lower Leg Wound Laterality: Right, Lateral Cleanser: Soap and Water  1 x Per Week/30 Days Discharge Instructions: May shower and wash wound with dial antibacterial soap and water prior to dressing change. Cleanser: Wound Cleanser 1 x Per Week/30 Days Discharge Instructions: Cleanse the wound with wound cleanser prior to applying a clean dressing using gauze sponges, not tissue or cotton balls. Peri-Wound Care: Triamcinolone 15 (g) 1 x Per Week/30 Days Discharge Instructions: Use triamcinolone 15 (g) as directed Peri-Wound Care: Sween Lotion (Moisturizing lotion) 1 x Per Week/30 Days Discharge Instructions: Apply moisturizing lotion as directed Topical: Mupirocin Ointment 1 x Per Week/30 Days Discharge Instructions: Apply Mupirocin (Bactroban) as instructed Prim Dressing: PROGENA MATRIX 1 x Per Week/30 Days ary Secondary Dressing: ABD Pad, 5x9 1 x Per Week/30 Days Discharge Instructions: Apply over primary dressing as directed. Secondary Dressing: Zetuvit Plus 4x8 in 1 x Per Week/30 Days Discharge Instructions: Apply over primary dressing as directed. Com pression Wrap: Kerlix Roll 4.5x3.1 (in/yd) 1 x Per Week/30 Days Discharge Instructions: Apply Kerlix and Coban compression as directed. Com pression Wrap: Coban Self-Adherent Wrap 4x5 (in/yd) 1 x Per Week/30 Days Discharge Instructions: Apply over Kerlix as directed. WOUND #21: - Ankle Wound Laterality: Right, Medial Cleanser: Soap and Water 1 x Per Week/30 Days Discharge Instructions: May shower and wash wound with dial antibacterial soap and water prior to dressing change. Cleanser: Wound Cleanser 1 x Per Week/30 Days Discharge Instructions: Cleanse the wound with wound cleanser prior to applying a clean dressing using gauze sponges, not tissue or cotton balls. Peri-Wound Care: Triamcinolone 15 (g) 1 x Per Week/30 Days Discharge Instructions: Use  triamcinolone 15 (g) as directed Peri-Wound Care: Sween Lotion (Moisturizing lotion) 1 x Per Week/30 Days Discharge Instructions: Apply moisturizing lotion as directed Topical: Mupirocin Ointment 1 x Per Week/30 Days Discharge Instructions: Apply Mupirocin (Bactroban) as instructed Prim Dressing: PROGENA MATRIX 1 x Per Week/30 Days ary Secondary Dressing: ABD Pad, 5x9 1 x Per Week/30 Days Discharge Instructions: Apply over primary dressing as directed. Secondary Dressing: Zetuvit Plus 4x8 in 1 x Per Week/30 Days Discharge Instructions: Apply over primary dressing as directed. Com pression Wrap: Kerlix Roll 4.5x3.1 (in/yd) 1 x Per Week/30 Days Discharge Instructions: Apply Kerlix and Coban compression as directed. Com pression Wrap: Coban Self-Adherent Wrap 4x5 (in/yd) 1 x Per Week/30 Days Discharge Instructions: Apply over Kerlix as directed. WOUND #22: - Ankle Wound Laterality: Left, Medial Cleanser: Soap and Water Discharge Instructions: May shower and wash wound with dial antibacterial soap and water prior to dressing change. Topical: Mupirocin Ointment Discharge Instructions: Apply Mupirocin (Bactroban) as instructed Prim Dressing: KerraCel Ag Gelling Fiber Dressing, 2x2 in (silver alginate) ary Discharge Instructions: Apply silver alginate to wound bed as instructed Secondary Dressing: Bordered Gauze, 2x2 in Discharge Instructions: Apply over primary dressing as directed. 12/06/2021: No significant change to her wounds, but she has a new opening on her dorsal foot, just distal to the right lateral ankle wound. The area on her left medial ankle that reopened looks a little bit larger today. She has quite a bit of pain associated with the new wound. Since we have been using the trial skin substitute product, I had stopped applying mupirocin to her wounds. 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 I debrided the slough from her wounds on her lateral and medial ankle as well as slough and eschar from the new wound on her dorsal foot and the recently reopened wound  on her left medial ankle. I applied ProgenaMatrix. Prior to application, I liberally fenestrated the product. After it was secured in place with Adaptic and Steri-Strips, I liberally massaged mupirocin into each of the wound sites. We will also use mupirocin with silver alginate to the new dorsal foot wound and her recently reopened left medial ankle wound. She will follow-up in 1 week. Electronic Signature(s) Signed: 12/06/2021 12:43:06 PM By: Fredirick Maudlin MD FACS Entered By: Fredirick Maudlin on 12/06/2021 12:43:06 -------------------------------------------------------------------------------- HxROS Details Patient Name: Date of Service: [name] [name], [name] 12/06/2021 11:00 A M Medical Record Number: [id number] Patient Account Number: [id number] Date of Birth/Sex: Treating RN: Dec 12, 1970 (52 y.o. [name] Primary Care Provider: [name] Other Clinician: Referring Provider: Treating Provider/Extender: Elyse Jarvis Weeks in Treatment: 27 Information Obtained From Patient Constitutional Symptoms (General Health) Medical History: Past Medical History Notes: H/O miscarriage Eyes Medical History: Negative for: Cataracts; Glaucoma; Optic Neuritis Ear/Nose/Mouth/Throat Medical History: Negative for: Chronic sinus problems/congestion; Middle ear problems Hematologic/Lymphatic Medical History: Positive for: Anemia; Sickle Cell Disease Negative for: Hemophilia; Human Immunodeficiency Virus; Lymphedema Respiratory Medical History: Negative for: Aspiration; Asthma; Chronic Obstructive Pulmonary Disease (COPD); Pneumothorax; Sleep Apnea; Tuberculosis Cardiovascular Medical History: Negative for: Angina; Arrhythmia; Congestive Heart Failure; Coronary  Artery Disease; Deep Vein Thrombosis; Hypertension; Hypotension; Myocardial Infarction; Peripheral Arterial Disease; Peripheral Venous Disease; Phlebitis; Vasculitis Past Medical History Notes: bradycardia Gastrointestinal Medical History: Negative for: Cirrhosis ; Colitis; Crohns; Hepatitis A; Hepatitis B; Hepatitis C Past Medical History Notes: cholilithiasis Endocrine Medical History: Negative for: Type I Diabetes; Type II Diabetes Genitourinary Medical History: Negative for: End Stage Renal Disease Immunological Medical History: Negative for: Lupus Erythematosus; Raynauds; Scleroderma Integumentary (Skin) Medical History: Negative for: History of Burn Musculoskeletal Medical History: Negative for: Gout; Rheumatoid Arthritis; Osteoarthritis; Osteomyelitis Neurologic Medical History: Positive for: Neuropathy - right foot intermittant Negative for: Dementia; Quadriplegia; Paraplegia; Seizure Disorder Oncologic Medical History: Negative for: Received Chemotherapy; Received Radiation Psychiatric Medical History: Negative for: Anorexia/bulimia; Confinement Anxiety Immunizations Pneumococcal Vaccine: Received Pneumococcal Vaccination: No Implantable Devices None Hospitalization / Surgery History Type of Hospitalization/Surgery c section x2 left breast lumpectomy iandD right ankle with theraskin Family and Social History Cancer: No; Diabetes: Yes - Mother; Heart Disease: No; Hereditary Spherocytosis: No; Hypertension: No; Kidney Disease: No; Lung Disease: Yes - Mother; Seizures: No; Stroke: No; Thyroid Problems: No; Tuberculosis: No; Never smoker; Marital Status - Married; Alcohol Use: Never; Drug Use: No History; Caffeine Use: Daily; Financial Concerns: No; Food, Clothing or Shelter Needs: No; Support System Lacking: No; Transportation Concerns: No Engineer, maintenance) Signed: 12/06/2021 12:47:40 PM By: Fredirick Maudlin MD FACS Signed: 12/10/2021 12:55:32 PM By:  Dellie Catholic RN Entered By: Fredirick Maudlin on 12/06/2021 12:35:54 -------------------------------------------------------------------------------- SuperBill Details Patient Name: Date of Service: [place], [name] 12/06/2021 Medical Record Number: [id number] Patient Account Number: [id number] Date of Birth/Sex: Treating RN: [birth year] (51 y.o. 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 Facility Procedures CPT4 Code: [id number] Description: 38453 - SKIN SUB GRAFT TRNK/ARM/LEG ICD-10 Diagnosis Description L97.818 Non-pressure chronic ulcer of other part of right lower leg with other specified s Modifier: everity Quantity: 1 CPT4 Code: [id number] Description: 15272 - SKIN SUB GRAFT T/A/L  ADD-ON ICD-10 Diagnosis Description L97.818 Non-pressure chronic ulcer of other part of right lower leg with other specified s Modifier: everity Quantity: 1 Physician Procedures : CPT4 Code Description Modifier [id number] - WC PHYS LEVEL 4 - EST PT 25 ICD-10 Diagnosis Description L97.818 Non-pressure chronic ulcer of other part of right lower leg with other specified severity L97.828 Non-pressure chronic ulcer of other part  of left lower leg with other specified severity 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 Quantity: 1 : [id number] 11735 - WC PHYS SKIN SUB GRAFT TRNK/ARM/LEG ICD-10 Diagnosis Description L97.818 Non-pressure chronic ulcer of other part of right lower leg with other specified severity Quantity: 1 : [id number] 15272 - WC PHYS SKIN SUB GRAFT T/A/L ADD-ON ICD-10 Diagnosis  Description L97.818 Non-pressure chronic ulcer of other part of right lower leg with other specified severity Quantity: 1 : [id number] 88875 - WC PHYS DEBR WO ANESTH 20 SQ CM ICD-10 Diagnosis Description L97.828 Non-pressure chronic ulcer of other part of left lower leg with other specified severity L97.818 Non-pressure chronic ulcer of other part of right lower leg with other  specified severity Quantity: 1 Electronic Signature(s) Signed: 12/06/2021 12:43:57 PM By: Fredirick Maudlin MD FACS Entered By: Fredirick Maudlin on 12/06/2021 12:43:57

## 2021-12-11 ENCOUNTER — Ambulatory Visit: Payer: Medicaid Other

## 2021-12-11 DIAGNOSIS — M25572 Pain in left ankle and joints of left foot: Secondary | ICD-10-CM | POA: Diagnosis not present

## 2021-12-11 DIAGNOSIS — M6281 Muscle weakness (generalized): Secondary | ICD-10-CM

## 2021-12-11 DIAGNOSIS — M25672 Stiffness of left ankle, not elsewhere classified: Secondary | ICD-10-CM

## 2021-12-12 ENCOUNTER — Encounter (HOSPITAL_BASED_OUTPATIENT_CLINIC_OR_DEPARTMENT_OTHER): Payer: Medicaid Other | Admitting: General Surgery

## 2021-12-12 DIAGNOSIS — L97818 Non-pressure chronic ulcer of other part of right lower leg with other specified severity: Secondary | ICD-10-CM | POA: Diagnosis not present

## 2021-12-12 NOTE — Progress Notes (Signed)
INDONESIA, MCKEOUGH ([id number]) Visit Report for 12/12/2021 Arrival Information Details Patient Name: Date of Service: [name] 12/12/2021 10:15 A M Medical Record Number: [id number] Patient Account Number: [id number] Date of Birth/Sex: Treating RN: 26-Apr-1971 (51 y.o. [name] Primary Care Mayfield Schoene: [name] Other Clinician: Referring Quron Ruddy: Treating Aubriella Perezgarcia/Extender: Elyse Jarvis Weeks in Treatment: 86 Visit Information History Since Last Visit Added or deleted any medications: No Patient Arrived: [name] Any new allergies or adverse reactions: No Arrival Time: 10:45 Had a fall or experienced change in No Accompanied By: self activities of daily living that may affect Transfer Assistance: None risk of falls: Patient Requires Transmission-Based Precautions: No Signs or symptoms of abuse/neglect since last visito No Patient Has Alerts: No Hospitalized since last visit: No Implantable device outside of the clinic excluding No cellular tissue based products placed in the center since last visit: Has Dressing in Place as Prescribed: Yes Has Compression in Place as Prescribed: Yes Pain Present Now: Yes Electronic Signature(s) Signed: 12/12/2021 6:00:07 PM By: Dellie Catholic RN Entered By: Dellie Catholic on 12/12/2021 10:45:54 -------------------------------------------------------------------------------- Encounter Discharge Information Details Patient Name: Date of Service: [name], [name] 12/12/2021 10:15 A M Medical Record Number: [id number] Patient Account Number: [id number] Date of Birth/Sex: Treating RN: [birth year] (51 y.o. [name] Primary Care Colbie Sliker: [name] Other Clinician: Referring Boneta Standre: Treating Merel Santoli/Extender: Elyse Jarvis Weeks in Treatment: 52 Encounter Discharge Information Items Post Procedure Vitals Discharge Condition: Stable Temperature (F): 98.2 Ambulatory  Status: Cane Pulse (bpm): 69 Discharge Destination: Home Respiratory Rate (breaths/min): 14 Transportation: Private Auto Blood Pressure (mmHg): 127/64 Accompanied By: self Schedule Follow-up Appointment: Yes Clinical Summary of Care: Patient Declined Electronic Signature(s) Signed: 12/12/2021 6:00:07 PM By: Dellie Catholic RN Entered By: Dellie Catholic on 12/12/2021 17:40:49 -------------------------------------------------------------------------------- Lower Extremity Assessment Details Patient Name: Date of Service: [name], [place] [name] 12/12/2021 10:15 A M Medical Record Number: [id number] Patient Account Number: [id number] Date of Birth/Sex: Treating RN: 1970-09-23 (51 y.o. [name] Primary Care Laveda Demedeiros: [name] Other Clinician: Referring Hurley Sobel: Treating Alek Poncedeleon/Extender: Elyse Jarvis Weeks in Treatment: 34 Edema Assessment Assessed: [Left: No] [Right: No] Edema: [Left: Ye] [Right: s] Calf Left: Right: Point of Measurement: 33 cm From Medial Instep 29.8 cm 30 cm Ankle Left: Right: Point of Measurement: 10 cm From Medial Instep 19.9 cm 20 cm Electronic Signature(s) Signed: 12/12/2021 6:00:07 PM By: Dellie Catholic RN Entered By: Dellie Catholic on 12/12/2021 11:09:35 -------------------------------------------------------------------------------- Multi Wound Chart Details Patient Name: Date of Service: [name], [name] 12/12/2021 10:15 A M Medical Record Number: [id number] Patient Account Number: [id number] Date of Birth/Sex: Treating RN: [birth year] (51 y.o. [name] Primary Care Karsten Vaughn: [name] Other Clinician: Referring Kannon Granderson: Treating Valisha Heslin/Extender: Elyse Jarvis Weeks in Treatment: 34 Vital Signs Height(in): 67 Pulse(bpm): 49 Weight(lbs): Blood Pressure(mmHg): 127/64 Body Mass Index(BMI): Temperature(F): 98.2 Respiratory Rate(breaths/min): 14 Photos: [17:No Photos  Right, Lateral Lower Leg] [21:No Photos Right, Medial Ankle] [22:No Photos Left, Medial Ankle] Wound Location: [17:Gradually Appeared] [21:Gradually Appeared] [22:Gradually Appeared] Wounding Event: [17:Sickle Cell Lesion] [21:Sickle Cell Lesion] [22:Lesion] Primary Etiology: [17:N/A] [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] [22:N/A] Secondary Etiology: [17:Anemia, Sickle Cell Disease,] [21:Anemia, Sickle Cell Disease,] [22:Anemia, Sickle Cell Disease,] Comorbid History: [17:Neuropathy 10/05/2012] [21:Neuropathy 06/26/2021] [22:Neuropathy 11/29/2021] Date Acquired: [17:34] [21:24] [22:1] Weeks of Treatment: [17:Open] [21:Open] [22:Open] Wound Status: [17:No] [21:No] [22:No] Wound Recurrence: [17:Yes] [21:No] [22:No] Clustered Wound: [17:5.8x4.8x0.1] [21:6x2.8x0.1] [22:0.3x0.3x0.1] Measurements L x W x D (cm) [17:21.865] [63:01.601] [22:0.071]  A (cm) : rea [17:2.187] [21:1.319] [61:9.509] Volume (cm) : [17:88.40%] [21:54.50%] [22:-787.50%] % Reduction in Area: [17:88.40%] [21:54.50%] [22:-600.00%] % Reduction in Volume: [17:Full Thickness Without Exposed] [21:Full Thickness Without Exposed] [22:Full Thickness Without Exposed] Classification: [17:Support Structures Medium] [21:Support Structures Medium] [22:Support Structures Medium] Exudate Amount: [17:Purulent] [21:Purulent] [22:Serosanguineous] Exudate Type: [17:yellow, brown, green] [21:yellow, brown, green] [22:red, brown] Exudate Color: [17:Flat and Intact] [21:Distinct, outline attached] [22:N/A] Wound Margin: [17:Large (67-100%)] [21:Medium (34-66%)] [22:Large (67-100%)] Granulation Amount: [17:Pink, Pale] [21:Pink] [22:Pink] Granulation Quality: [17:Small (1-33%)] [21:Medium (34-66%)] [22:Small (1-33%)] Necrotic Amount: [17:Fat Layer (Subcutaneous Tissue): Yes Fat Layer (Subcutaneous Tissue): Yes Fat Layer (Subcutaneous Tissue): Yes] Exposed Structures: [17:Fascia: No Tendon: No Muscle: No Joint: No Bone: No Small (1-33%)] [21:Fascia: No Tendon: No  Muscle: No Joint: No Bone: No Small (1-33%)] [22:Fascia: No Tendon: No Muscle: No Joint: No Bone: No Small (1-33%)] Epithelialization: [17:Debridement - Excisional] [21:Debridement - Selective/Open Wound Debridement - Selective/Open Wound] Debridement: Pre-procedure Verification/Time Out 11:18 [21:11:18] [22:11:18] Taken: [17:Lidocaine 5% topical ointment] [21:Lidocaine 5% topical ointment] [22:Lidocaine 5% topical ointment] Pain Control: [17:Subcutaneous, Slough] [21:Necrotic/Eschar, Slough] [22:Necrotic/Eschar] Tissue Debrided: [17:Skin/Subcutaneous Tissue] [21:Non-Viable Tissue] [22:Non-Viable Tissue] Level: [17:27.84] [21:16.8] [22:0.09] Debridement A (sq cm): [17:rea Curette] [21:Curette] [22:Curette] Instrument: [17:Minimum] [21:Minimum] [22:Minimum] Bleeding: [17:Pressure] [21:Pressure] [22:Pressure] Hemostasis A chieved: [17:0] [21:0] [22:0] Procedural Pain: [17:0] [21:0] [22:0] Post Procedural Pain: [17:Procedure was tolerated well] [21:Procedure was tolerated well] [22:Procedure was tolerated well] Debridement Treatment Response: [17:5.8x4.8x0.1] [21:6x2.8x0.1] [22:0.3x0.3x0.3] Post Debridement Measurements L x W x D (cm) [17:2.187] [21:1.319] [22:0.021] Post Debridement Volume: (cm) [17:Debridement] [21:Debridement] [22:Debridement] Wound Number: 23 N/A N/A Photos: No Photos N/A N/A Right, Distal, Lateral Ankle N/A N/A Wound Location: Gradually Appeared N/A N/A Wounding Event: T be determined o N/A N/A Primary Etiology: N/A N/A N/A Secondary Etiology: Anemia, Sickle Cell Disease, N/A N/A Comorbid History: Neuropathy 12/06/2021 N/A N/A Date Acquired: 0 N/A N/A Weeks of Treatment: Open N/A N/A Wound Status: No N/A N/A Wound Recurrence: No N/A N/A Clustered Wound: 2x0.3x0.2 N/A N/A Measurements L x W x D (cm) 0.471 N/A N/A A (cm) : rea 0.094 N/A N/A Volume (cm) : N/A N/A N/A % Reduction in A rea: N/A N/A N/A % Reduction in Volume: Full Thickness Without  Exposed N/A N/A Classification: Support Structures Medium N/A N/A Exudate A mount: Serosanguineous N/A N/A Exudate Type: red, brown N/A N/A Exudate Color: N/A N/A N/A Wound Margin: Medium (34-66%) N/A N/A Granulation A mount: Pink N/A N/A Granulation Quality: Medium (34-66%) N/A N/A Necrotic A mount: Fat Layer (Subcutaneous Tissue): Yes N/A N/A Exposed Structures: Fascia: No Tendon: No Muscle: No Joint: No Bone: No Small (1-33%) N/A N/A Epithelialization: Debridement - Excisional N/A N/A Debridement: Pre-procedure Verification/Time Out 11:18 N/A N/A Taken: Lidocaine 5% topical ointment N/A N/A Pain Control: Subcutaneous, Slough N/A N/A Tissue Debrided: Skin/Subcutaneous Tissue N/A N/A Level: 0.6 N/A N/A Debridement A (sq cm): rea Curette N/A N/A Instrument: Minimum N/A N/A Bleeding: Pressure N/A N/A Hemostasis A chieved: 0 N/A N/A Procedural Pain: 0 N/A N/A Post Procedural Pain: Procedure was tolerated well N/A N/A Debridement Treatment Response: 2x0.3x0.2 N/A N/A Post Debridement Measurements L x W x D (cm) 0.094 N/A N/A Post Debridement Volume: (cm) Debridement N/A N/A Procedures Performed: Treatment Notes Electronic Signature(s) Signed: 12/12/2021 11:55:19 AM By: Fredirick Maudlin MD FACS Signed: 12/12/2021 6:00:07 PM By: Dellie Catholic RN Entered By: Fredirick Maudlin on 12/12/2021 11:55:18 -------------------------------------------------------------------------------- Multi-Disciplinary Care Plan Details Patient Name: Date of Service: [name], [name] 12/12/2021 10:15 A M Medical Record Number: [id number]  Patient Account Number: [id number] Date of Birth/Sex: Treating RN: 1970/05/24 (51 y.o. [name] Primary Care Skyah Hannon: [name] Other Clinician: Referring Evelene Roussin: Treating Tanisha Lutes/Extender: Elyse Jarvis Weeks in Treatment: 48 Multidisciplinary Care Plan reviewed with physician Active  Inactive Venous Leg Ulcer Nursing Diagnoses: Actual venous Insuffiency (use after diagnosis is confirmed) Knowledge deficit related to disease process and management Goals: Patient will maintain optimal edema control Date Initiated: 06/26/2021 Target Resolution Date: 12/14/2021 Goal Status: Active Interventions: Assess peripheral edema status every visit. Compression as ordered Treatment Activities: Therapeutic compression applied : 06/26/2021 Notes: Wound/Skin Impairment Nursing Diagnoses: Impaired tissue integrity Goals: Patient/caregiver will verbalize understanding of skin care regimen Date Initiated: 04/17/2021 Target Resolution Date: 12/14/2021 Goal Status: Active Ulcer/skin breakdown will have a volume reduction of 30% by week 4 Date Initiated: 04/17/2021 Date Inactivated: 05/29/2021 Target Resolution Date: 05/15/2021 Goal Status: Met Ulcer/skin breakdown will have a volume reduction of 50% by week 8 Date Initiated: 05/29/2021 Date Inactivated: 06/26/2021 Target Resolution Date: 06/26/2021 Goal Status: Unmet Unmet Reason: venous reflux Interventions: Assess patient/caregiver ability to obtain necessary supplies Assess patient/caregiver ability to perform ulcer/skin care regimen upon admission and as needed Assess ulceration(s) every visit Provide education on ulcer and skin care Treatment Activities: Topical wound management initiated : 04/17/2021 Notes: 06/08/21: Left leg wounds greater than 30% volume reduction, right leg acute infection. Electronic Signature(s) Signed: 12/12/2021 6:00:07 PM By: Dellie Catholic RN Entered By: Dellie Catholic on 12/12/2021 17:39:09 -------------------------------------------------------------------------------- Pain Assessment Details Patient Name: Date of Service: [name], [place] [name] 12/12/2021 10:15 A M Medical Record Number: [id number] Patient Account Number: [id number] Date of Birth/Sex: Treating RN: June 04, 1970 (51 y.o. [name] Primary Care Blakleigh Straw: [name] Other Clinician: Referring Donnie Gedeon: Treating Helios Kohlmann/Extender: Elyse Jarvis Weeks in Treatment: 51 Active Problems Location of Pain Severity and Description of Pain Patient Has Paino Yes Site Locations Rate the pain. Current Pain Level: 6 Worst Pain Level: 10 Least Pain Level: 3 Tolerable Pain Level: 6 Character of Pain Describe the Pain: Difficult to Pinpoint Pain Management and Medication Current Pain Management: Medication: No Cold Application: No Rest: Yes Massage: No Activity: No T.E.N.S.: No Heat Application: No Leg drop or elevation: No Is the Current Pain Management Adequate: Adequate How does your wound impact your activities of daily livingo Sleep: No Bathing: No Appetite: No Relationship With Others: No Bladder Continence: No Emotions: No Bowel Continence: No Work: No Toileting: No Drive: No Dressing: No Hobbies: No Electronic Signature(s) Signed: 12/12/2021 6:00:07 PM By: Dellie Catholic RN Entered By: Dellie Catholic on 12/12/2021 11:08:28 -------------------------------------------------------------------------------- Patient/Caregiver Education Details Patient Name: Date of Service: [name], [name] 7/26/2023andnbsp10:15 A M Medical Record Number: [id number] Patient Account Number: [id number] Date of Birth/Gender: Treating RN: 29-Sep-1970 (51 y.o. [name] Primary Care Physician: [name] Other Clinician: Referring Physician: Treating Physician/Extender: Elyse Jarvis Weeks in Treatment: 31 Education Assessment Education Provided To: Patient Education Topics Provided Wound/Skin Impairment: Methods: Explain/Verbal Responses: Return demonstration correctly Electronic Signature(s) Signed: 12/12/2021 6:00:07 PM By: Dellie Catholic RN Entered By: Dellie Catholic on 12/12/2021  17:39:28 -------------------------------------------------------------------------------- Wound Assessment Details Patient Name: Date of Service: [name], [name] 12/12/2021 10:15 A M Medical Record Number: [id number] Patient Account Number: [id number] Date of Birth/Sex: Treating RN: 12-15-1970 (51 y.o. [name] Primary Care Kayslee Furey: [name] Other Clinician: Referring Chaniya Genter: Treating Ashawnti Tangen/Extender: Elyse Jarvis Weeks in Treatment: 34 Wound Status Wound Number: 17 Primary Etiology: Sickle Cell Lesion Wound Location: Right,  Lateral Lower Leg Wound Status: Open Wounding Event: Gradually Appeared Comorbid History: Anemia, Sickle Cell Disease, Neuropathy Date Acquired: 10/05/2012 Weeks Of Treatment: 34 Clustered Wound: Yes Wound Measurements Length: (cm) 5.8 Width: (cm) 4.8 Depth: (cm) 0.1 Area: (cm) 21.865 Volume: (cm) 2.187 % Reduction in Area: 88.4% % Reduction in Volume: 88.4% Epithelialization: Small (1-33%) Tunneling: No Undermining: No Wound Description Classification: Full Thickness Without Exposed Support Structures Wound Margin: Flat and Intact Exudate Amount: Medium Exudate Type: Purulent Exudate Color: yellow, brown, green Foul Odor After Cleansing: No Slough/Fibrino Yes Wound Bed Granulation Amount: Large (67-100%) Exposed Structure Granulation Quality: Pink, Pale Fascia Exposed: No Necrotic Amount: Small (1-33%) Fat Layer (Subcutaneous Tissue) Exposed: Yes Necrotic Quality: Adherent Slough Tendon Exposed: No Muscle Exposed: No Joint Exposed: No Bone Exposed: No Treatment Notes Wound #17 (Lower Leg) Wound Laterality: Right, Lateral Cleanser Soap and Water Discharge Instruction: May shower and wash wound with dial antibacterial soap and water prior to dressing change. Wound Cleanser Discharge Instruction: Cleanse the wound with wound cleanser prior to applying a clean dressing using gauze sponges, not  tissue or cotton balls. Peri-Wound Care Triamcinolone 15 (g) Discharge Instruction: Use triamcinolone 15 (g) as directed Sween Lotion (Moisturizing lotion) Discharge Instruction: Apply moisturizing lotion as directed Topical Mupirocin Ointment Discharge Instruction: Apply Mupirocin (Bactroban) as instructed Primary Dressing PROGENA MATRIX Secondary Dressing ABD Pad, 5x9 Discharge Instruction: Apply over primary dressing as directed. Zetuvit Plus 4x8 in Discharge Instruction: Apply over primary dressing as directed. Secured With Compression Wrap Kerlix Roll 4.5x3.1 (in/yd) Discharge Instruction: Apply Kerlix and Coban compression as directed. Coban Self-Adherent Wrap 4x5 (in/yd) Discharge Instruction: Apply over Kerlix as directed. Compression Stockings Add-Ons Electronic Signature(s) Signed: 12/12/2021 6:00:07 PM By: Dellie Catholic RN Entered By: Dellie Catholic on 12/12/2021 11:00:01 -------------------------------------------------------------------------------- Wound Assessment Details Patient Name: Date of Service: [name], [place] [name] 12/12/2021 10:15 A M Medical Record Number: [id number] Patient Account Number: [id number] Date of Birth/Sex: Treating RN: Aug 09, 1970 (50 y.o. [name] Primary Care Darah Simkin: [name] Other Clinician: Referring Khrystina Bonnes: Treating Jermario Kalmar/Extender: Elyse Jarvis Weeks in Treatment: 34 Wound Status Wound Number: 21 Primary Etiology: Sickle Cell Lesion Wound Location: Right, Medial Ankle Secondary Etiology: Venous Leg Ulcer Wounding Event: Gradually Appeared Wound Status: Open Date Acquired: 06/26/2021 Comorbid History: Anemia, Sickle Cell Disease, Neuropathy Weeks Of Treatment: 24 Clustered Wound: No Wound Measurements Length: (cm) 6 Width: (cm) 2.8 Depth: (cm) 0.1 Area: (cm) 13.195 Volume: (cm) 1.319 % Reduction in Area: 54.5% % Reduction in Volume: 54.5% Epithelialization: Small  (1-33%) Tunneling: No Undermining: No Wound Description Classification: Full Thickness Without Exposed Support Structures Wound Margin: Distinct, outline attached Exudate Amount: Medium Exudate Type: Purulent Exudate Color: yellow, brown, green Foul Odor After Cleansing: No Slough/Fibrino Yes Wound Bed Granulation Amount: Medium (34-66%) Exposed Structure Granulation Quality: Pink Fascia Exposed: No Necrotic Amount: Medium (34-66%) Fat Layer (Subcutaneous Tissue) Exposed: Yes Necrotic Quality: Adherent Slough Tendon Exposed: No Muscle Exposed: No Joint Exposed: No Bone Exposed: No Treatment Notes Wound #21 (Ankle) Wound Laterality: Right, Medial Cleanser Soap and Water Discharge Instruction: May shower and wash wound with dial antibacterial soap and water prior to dressing change. Wound Cleanser Discharge Instruction: Cleanse the wound with wound cleanser prior to applying a clean dressing using gauze sponges, not tissue or cotton balls. Peri-Wound Care Triamcinolone 15 (g) Discharge Instruction: Use triamcinolone 15 (g) as directed Sween Lotion (Moisturizing lotion) Discharge Instruction: Apply moisturizing lotion as directed Topical Mupirocin Ointment Discharge Instruction: Apply Mupirocin (Bactroban) as instructed Primary Dressing PROGENA  MATRIX Secondary Dressing ABD Pad, 5x9 Discharge Instruction: Apply over primary dressing as directed. Zetuvit Plus 4x8 in Discharge Instruction: Apply over primary dressing as directed. Secured With Compression Wrap Kerlix Roll 4.5x3.1 (in/yd) Discharge Instruction: Apply Kerlix and Coban compression as directed. Coban Self-Adherent Wrap 4x5 (in/yd) Discharge Instruction: Apply over Kerlix as directed. Compression Stockings Add-Ons Electronic Signature(s) Signed: 12/12/2021 6:00:07 PM By: Dellie Catholic RN Entered By: Dellie Catholic on 12/12/2021  10:58:59 -------------------------------------------------------------------------------- Wound Assessment Details Patient Name: Date of Service: [name], [place] [name] 12/12/2021 10:15 A M Medical Record Number: [id number] Patient Account Number: [id number] Date of Birth/Sex: Treating RN: 06-17-1970 (51 y.o. [name] Primary Care Atisha Hamidi: [name] Other Clinician: Referring Cheria Sadiq: Treating Kristopher Delk/Extender: Elyse Jarvis Weeks in Treatment: 34 Wound Status Wound Number: 22 Primary Etiology: Lesion Wound Location: Left, Medial Ankle Wound Status: Open Wounding Event: Gradually Appeared Comorbid History: Anemia, Sickle Cell Disease, Neuropathy Date Acquired: 11/29/2021 Weeks Of Treatment: 1 Clustered Wound: No Wound Measurements Length: (cm) 0.3 Width: (cm) 0.3 Depth: (cm) 0.1 Area: (cm) 0.071 Volume: (cm) 0.007 % Reduction in Area: -787.5% % Reduction in Volume: -600% Epithelialization: Small (1-33%) Tunneling: No Undermining: No Wound Description Classification: Full Thickness Without Exposed Support Structures Exudate Amount: Medium Exudate Type: Serosanguineous Exudate Color: red, brown Foul Odor After Cleansing: No Slough/Fibrino Yes Wound Bed Granulation Amount: Large (67-100%) Exposed Structure Granulation Quality: Pink Fascia Exposed: No Necrotic Amount: Small (1-33%) Fat Layer (Subcutaneous Tissue) Exposed: Yes Necrotic Quality: Adherent Slough Tendon Exposed: No Muscle Exposed: No Joint Exposed: No Bone Exposed: No Treatment Notes Wound #22 (Ankle) Wound Laterality: Left, Medial Cleanser Soap and Water Discharge Instruction: May shower and wash wound with dial antibacterial soap and water prior to dressing change. Peri-Wound Care Topical Mupirocin Ointment Discharge Instruction: Apply Mupirocin (Bactroban) as instructed Primary Dressing KerraCel Ag Gelling Fiber Dressing, 2x2 in (silver alginate) Discharge  Instruction: Apply silver alginate to wound bed as instructed Secondary Dressing Bordered Gauze, 2x2 in Discharge Instruction: Apply over primary dressing as directed. Secured With Compression Wrap Compression Stockings Environmental education officer) Signed: 12/12/2021 6:00:07 PM By: Dellie Catholic RN Entered By: Dellie Catholic on 12/12/2021 10:57:48 -------------------------------------------------------------------------------- Wound Assessment Details Patient Name: Date of Service: [name], [place] [name] 12/12/2021 10:15 A M Medical Record Number: [id number] Patient Account Number: [id number] Date of Birth/Sex: Treating RN: 10/28/1970 (51 y.o. [name] Primary Care Serene Kopf: [name] Other Clinician: Referring Ishita Mcnerney: Treating Demyah Smyre/Extender: Elyse Jarvis Weeks in Treatment: 34 Wound Status Wound Number: 23 Primary Etiology: T be determined o Wound Location: Right, Distal, Lateral Ankle Wound Status: Open Wounding Event: Gradually Appeared Comorbid History: Anemia, Sickle Cell Disease, Neuropathy Date Acquired: 12/06/2021 Weeks Of Treatment: 0 Clustered Wound: No Wound Measurements Length: (cm) 2 Width: (cm) 0.3 Depth: (cm) 0.2 Area: (cm) 0.471 Volume: (cm) 0.094 % Reduction in Area: % Reduction in Volume: Epithelialization: Small (1-33%) Tunneling: No Undermining: No Wound Description Classification: Full Thickness Without Exposed Support Structures Exudate Amount: Medium Exudate Type: Serosanguineous Exudate Color: red, brown Foul Odor After Cleansing: No Slough/Fibrino Yes Wound Bed Granulation Amount: Medium (34-66%) Exposed Structure Granulation Quality: Pink Fascia Exposed: No Necrotic Amount: Medium (34-66%) Fat Layer (Subcutaneous Tissue) Exposed: Yes Necrotic Quality: Adherent Slough Tendon Exposed: No Muscle Exposed: No Joint Exposed: No Bone Exposed: No Treatment Notes Wound #23 (Ankle) Wound  Laterality: Right, Lateral, Distal Cleanser Peri-Wound Care Topical Primary Dressing Secondary Dressing Secured With Compression Wrap Compression Stockings Add-Ons Electronic Signature(s) Signed: 12/12/2021 6:00:07 PM By: Minus Liberty,  [name] RN Entered By: Dellie Catholic on 12/12/2021 11:03:30 -------------------------------------------------------------------------------- Vitals Details Patient Name: Date of Service: [name] 12/12/2021 10:15 A M Medical Record Number: [id number] Patient Account Number: [id number] Date of Birth/Sex: Treating RN: [birth year] (51 y.o. [name] Primary Care Maxemiliano Riel: [name] Other Clinician: Referring Marvine Encalade: Treating Isrrael Fluckiger/Extender: Elyse Jarvis Weeks in Treatment: 34 Vital Signs Time Taken: 10:45 Temperature (F): 98.2 Height (in): 67 Pulse (bpm): 69 Respiratory Rate (breaths/min): 14 Blood Pressure (mmHg): 127/64 Reference Range: 80 - 120 mg / dl Electronic Signature(s) Signed: 12/12/2021 6:00:07 PM By: Dellie Catholic RN Entered By: Dellie Catholic on 12/12/2021 11:07:23

## 2021-12-13 NOTE — Progress Notes (Addendum)
EDEE, NIFONG ([id number]) Visit Report for 12/12/2021 Chief Complaint Document Details Patient Name: Date of Service: [name] [name] 12/12/2021 10:15 A M Medical Record Number: [id number] Patient Account Number: [id number] Date of Birth/Sex: Treating RN: [birth year] (51 y.o. [name] Primary Care Provider: [name] Other Clinician: Referring Provider: Treating Provider/Extender: Elyse Jarvis Weeks in Treatment: 79 Information Obtained from: Patient Chief Complaint the patient is here for evaluation of her bilateral lower extremity sickle cell ulcers 04/17/2021; patient comes in for substantial wounds on the right and left lower leg Electronic Signature(s) Signed: 12/12/2021 11:55:28 AM By: Fredirick Maudlin MD FACS Entered By: Fredirick Maudlin on 12/12/2021 11:55:27 -------------------------------------------------------------------------------- Cellular or Tissue Based Product Details Patient Name: Date of Service: [name] 12/12/2021 10:15 A M Medical Record Number: [id number] Patient Account Number: [id number] Date of Birth/Sex: Treating RN: Aug 30, 1970 (51 y.o. [name] Primary Care Provider: [name] Other Clinician: Referring Provider: Treating Provider/Extender: Elyse Jarvis Weeks in Treatment: 62 Cellular or Tissue Based Product Type Wound #17 Right,Lateral Lower Leg Applied to: Performed By: Physician Fredirick Maudlin, MD Cellular or Tissue Based Product Type: Level of Consciousness (Pre-procedure): Awake and Alert Pre-procedure Verification/Time Out Yes - 11:18 Taken: Location: trunk / arms / legs Wound Size (sq cm): 27.84 Product Size (sq cm): 36 Waste Size (sq cm): 0 Amount of Product Applied (sq cm): 36 Instrument Used: Forceps, Scissors Lot #: [id number] Order #: 6 Expiration Date: 03/23/2204 Fenestrated: No Reconstituted: No Secured: Yes Secured With:  Steri-Strips Dressing Applied: Yes Primary Dressing: Adaptic;gauze Procedural Pain: 0 Post Procedural Pain: 0 Response to Treatment: Procedure was tolerated well Level of Consciousness (Post- Awake and Alert procedure): Post Procedure Diagnosis Same as Pre-procedure Notes Progena matrix*** trial*** Electronic Signature(s) Signed: 12/12/2021 6:00:07 PM By: Dellie Catholic RN Signed: 12/13/2021 9:16:21 AM By: Fredirick Maudlin MD FACS Entered By: Dellie Catholic on 12/12/2021 17:12:20 -------------------------------------------------------------------------------- Cellular or Tissue Based Product Details Patient Name: Date of Service: [name] 12/12/2021 10:15 A M Medical Record Number: [id number] Patient Account Number: [id number] Date of Birth/Sex: Treating RN: 02-Feb-1971 (51 y.o. [name] Primary Care Provider: [name] Other Clinician: Referring Provider: Treating Provider/Extender: Elyse Jarvis Weeks in Treatment: 34 Cellular or Tissue Based Product Type Wound #21 Right,Medial Ankle Applied to: Performed By: Physician Fredirick Maudlin, MD Cellular or Tissue Based Product Type: Level of Consciousness (Pre-procedure): Awake and Alert Pre-procedure Verification/Time Out Yes - 11:18 Taken: Location: trunk / arms / legs Wound Size (sq cm): 16.8 Product Size (sq cm): 16 Waste Size (sq cm): 0 Amount of Product Applied (sq cm): 16 Instrument Used: Forceps, Scissors Lot #: [id number] Order #: 6 Expiration Date: 03/23/2204 Fenestrated: No Reconstituted: No Secured: Yes Secured With: Steri-Strips Dressing Applied: Yes Primary Dressing: Adaptic;gauze Procedural Pain: 0 Post Procedural Pain: 0 Response to Treatment: Procedure was tolerated well Level of Consciousness (Post- Awake and Alert procedure): Post Procedure Diagnosis Same as Pre-procedure Notes Progena matrix**** trial*** Electronic Signature(s) Signed:  12/12/2021 6:00:07 PM By: Dellie Catholic RN Signed: 12/13/2021 9:16:21 AM By: Fredirick Maudlin MD FACS Entered By: Dellie Catholic on 12/12/2021 17:17:17 -------------------------------------------------------------------------------- Debridement Details Patient Name: Date of Service: [name] 12/12/2021 10:15 A M Medical Record Number: [id number] Patient Account Number: [id number] Date of Birth/Sex: Treating RN: 14-Mar-1971 (51 y.o. [name] Primary Care Provider: [name] Other Clinician: Referring Provider: Treating Provider/Extender: Elyse Jarvis Weeks in Treatment: 54 Debridement Performed for Assessment: Wound #  22 Left,Medial Ankle Performed By: Physician Fredirick Maudlin, MD Debridement Type: Debridement Level of Consciousness (Pre-procedure): Awake and Alert Pre-procedure Verification/Time Out Yes - 11:18 Taken: Start Time: 11:18 Pain Control: Lidocaine 5% topical ointment T Area Debrided (L x W): otal 0.3 (cm) x 0.3 (cm) = 0.09 (cm) Tissue and other material debrided: Non-Viable, Eschar Level: Non-Viable Tissue Debridement Description: Selective/Open Wound Instrument: Curette Bleeding: Minimum Hemostasis Achieved: Pressure End Time: 11:19 Procedural Pain: 0 Post Procedural Pain: 0 Response to Treatment: Procedure was tolerated well Level of Consciousness (Post- Awake and Alert procedure): Post Debridement Measurements of Total Wound Length: (cm) 0.3 Width: (cm) 0.3 Depth: (cm) 0.3 Volume: (cm) 0.021 Character of Wound/Ulcer Post Debridement: Improved Post Procedure Diagnosis Same as Pre-procedure Electronic Signature(s) Signed: 12/12/2021 12:38:36 PM By: Fredirick Maudlin MD FACS Signed: 12/12/2021 6:00:07 PM By: Dellie Catholic RN Entered By: Dellie Catholic on 12/12/2021 11:20:51 -------------------------------------------------------------------------------- Debridement Details Patient Name: Date of  Service: [name] 12/12/2021 10:15 A M Medical Record Number: [id number] Patient Account Number: [id number] Date of Birth/Sex: Treating RN: 09/13/1970 (51 y.o. [name] Primary Care Provider: [name] Other Clinician: Referring Provider: Treating Provider/Extender: Elyse Jarvis Weeks in Treatment: 34 Debridement Performed for Assessment: Wound #21 Right,Medial Ankle Performed By: Physician Fredirick Maudlin, MD Debridement Type: Debridement Severity of Tissue Pre Debridement: Fat layer exposed Level of Consciousness (Pre-procedure): Awake and Alert Pre-procedure Verification/Time Out Yes - 11:18 Taken: Start Time: 11:18 Pain Control: Lidocaine 5% topical ointment T Area Debrided (L x W): otal 6 (cm) x 2.8 (cm) = 16.8 (cm) Tissue and other material debrided: Non-Viable, Eschar, Slough, Slough Level: Non-Viable Tissue Debridement Description: Selective/Open Wound Instrument: Curette Bleeding: Minimum Hemostasis Achieved: Pressure End Time: 11:19 Procedural Pain: 0 Post Procedural Pain: 0 Response to Treatment: Procedure was tolerated well Level of Consciousness (Post- Awake and Alert procedure): Post Debridement Measurements of Total Wound Length: (cm) 6 Width: (cm) 2.8 Depth: (cm) 0.1 Volume: (cm) 1.319 Character of Wound/Ulcer Post Debridement: Improved Severity of Tissue Post Debridement: Fat layer exposed Post Procedure Diagnosis Same as Pre-procedure Electronic Signature(s) Signed: 12/12/2021 12:38:36 PM By: Fredirick Maudlin MD FACS Signed: 12/12/2021 6:00:07 PM By: Dellie Catholic RN Entered By: Dellie Catholic on 12/12/2021 11:22:48 -------------------------------------------------------------------------------- Debridement Details Patient Name: Date of Service: [name] 12/12/2021 10:15 A M Medical Record Number: [id number] Patient Account Number: [id number] Date of Birth/Sex: Treating RN: [birth year] (51  y.o. [name] Primary Care Provider: [name] Other Clinician: Referring Provider: Treating Provider/Extender: Elyse Jarvis Weeks in Treatment: 34 Debridement Performed for Assessment: Wound #23 Right,Distal,Lateral Ankle Performed By: Physician Fredirick Maudlin, MD Debridement Type: Debridement Level of Consciousness (Pre-procedure): Awake and Alert Pre-procedure Verification/Time Out Yes - 11:18 Taken: Start Time: 11:18 Pain Control: Lidocaine 5% topical ointment T Area Debrided (L x W): otal 2 (cm) x 0.3 (cm) = 0.6 (cm) Tissue and other material debrided: Non-Viable, Slough, Subcutaneous, Slough Level: Skin/Subcutaneous Tissue Debridement Description: Excisional Instrument: Curette Bleeding: Minimum Hemostasis Achieved: Pressure End Time: 11:19 Procedural Pain: 0 Post Procedural Pain: 0 Response to Treatment: Procedure was tolerated well Level of Consciousness (Post- Awake and Alert procedure): Post Debridement Measurements of Total Wound Length: (cm) 2 Width: (cm) 0.3 Depth: (cm) 0.2 Volume: (cm) 0.094 Character of Wound/Ulcer Post Debridement: Improved Post Procedure Diagnosis Same as Pre-procedure Electronic Signature(s) Signed: 12/12/2021 12:38:36 PM By: Fredirick Maudlin MD FACS Signed: 12/12/2021 6:00:07 PM By: Dellie Catholic RN Entered By: Dellie Catholic on 12/12/2021 11:26:09 -------------------------------------------------------------------------------- Debridement Details  Patient Name: Date of Service: [name] [name] 12/12/2021 10:15 A M Medical Record Number: [id number] Patient Account Number: [id number] Date of Birth/Sex: Treating RN: 03/03/1971 (51 y.o. [name] Primary Care Provider: [name] Other Clinician: Referring Provider: Treating Provider/Extender: Elyse Jarvis Weeks in Treatment: 34 Debridement Performed for Assessment: Wound #17 Right,Lateral Lower  Leg Performed By: Physician Fredirick Maudlin, MD Debridement Type: Debridement Level of Consciousness (Pre-procedure): Awake and Alert Pre-procedure Verification/Time Out Yes - 11:18 Taken: Start Time: 11:18 Pain Control: Lidocaine 5% topical ointment T Area Debrided (L x W): otal 5.8 (cm) x 4.8 (cm) = 27.84 (cm) Tissue and other material debrided: Non-Viable, Slough, Subcutaneous, Slough Level: Skin/Subcutaneous Tissue Debridement Description: Excisional Instrument: Curette Bleeding: Minimum Hemostasis Achieved: Pressure End Time: 11:19 Procedural Pain: 0 Post Procedural Pain: 0 Response to Treatment: Procedure was tolerated well Level of Consciousness (Post- Awake and Alert procedure): Post Debridement Measurements of Total Wound Length: (cm) 5.8 Width: (cm) 4.8 Depth: (cm) 0.1 Volume: (cm) 2.187 Character of Wound/Ulcer Post Debridement: Improved Post Procedure Diagnosis Same as Pre-procedure Electronic Signature(s) Signed: 12/12/2021 12:38:36 PM By: Fredirick Maudlin MD FACS Signed: 12/12/2021 6:00:07 PM By: Dellie Catholic RN Entered By: Dellie Catholic on 12/12/2021 11:34:52 -------------------------------------------------------------------------------- HPI Details Patient Name: Date of Service: [name] 12/12/2021 10:15 A M Medical Record Number: [id number] Patient Account Number: [id number] Date of Birth/Sex: Treating RN: 01-28-1971 (51 y.o. [name] Primary Care Provider: [name] Other Clinician: Referring Provider: Treating Provider/Extender: Elyse Jarvis Weeks in Treatment: 3 History of Present Illness Location: medial and lateral ankle region on the right and left medial malleolus Quality: Patient reports experiencing a shooting pain to affected area(s). Severity: Patient states wound(s) are getting worse. Duration: right lower extremity bimalleolar ulcers have been present for approximately 2 years; the  rright meedial malleolus ulcer has been there proximally 6 months Timing: Pain in wound is constant (hurts all the time) Context: The wound would happen gradually ssociated Signs and Symptoms: Patient reports having increase discharge. A HPI Description: 51 year old patient with a history of sickle cell anemia who was last seen by me with ulceration of the right lower extremity above the ankle and was referred to Dr. Leland Johns for a surgical debridement as I was unable to do anything in the office due to excruciating pain. At that stage she was referred from the plastic surgery service to dermatology who treated her for a skin infection with doxycycline and then Levaquin and a local antibiotic ointment. I understand the patient has since developed ulceration on the left ankle both medial and lateral and was now referred back to the wound center as dermatology has finished the management. I do not have any notes from the dermatology department Old notes: 51 year old patient with a history of sickle cell anemia, pain bilateral lower extremities, right lower extremity ulcer and has a history of receiving a skin graft( Theraskin) several months ago. She has been visiting the wound center [facility] and was seen by Dr. Dellia Nims and Dr. Leland Johns. after prolonged conservator therapy between July 2016 and January 2017. She had been seen by the plastic surgeon and taken to the OR for debridement and application of Theraskin. She had 3 applications of Theraskin and was then treated with collagen. Prior to that she had a history of similar problems in 2014 and was treated conservatively. Had a reflux study done for the right lower extremity in August 2016 without reflux or DVT . Past  medical history significant for sickle cell disease, anemia, leg ulcers, cholelithiasis,and has never been a smoker. Once the patient was discharged on the wound center she says within 2 or 3 weeks the problems recurred and she  has been treating it conservatively. since I saw her 3 weeks ago at [facility] she has been unable to get her dressing material but has completed a course of doxycycline. 6/7/ 2017 -- lower extremity venous duplex reflux evaluation was done No evidence of SVT or DVT in the RLL. No venous incompetence in the RLL. No further vascular workup is indicated at this time. She was seen by Dr. [name], on 10/04/2015. She agreed with the plan of taking her to the OR for debridement and application of theraskin and would also take biopsies to rule out pyoderma gangrenosum. Follow-up note dated May 31 received and she was status post application of Theraskin to multiple ulcers around the right ankle. Pathology did not show evidence of malignancy or pyoderma gangrenosum. She would continue to see as in the wound clinic for further care and see Dr. Leland Johns as needed. The patient brought the biopsy report and it was consistent with stasis ulcer no evidence of malignancy and the comment was that there was some adjacent neovascularization, fibrosis and patchy perivascular chronic inflammation. 11/15/2015 -- today we applied her first application of Theraskin 11/30/15; TheraSkin #2 12/13/2015 -- she is having a lot of pain locally and is here for possible application of a theraskin today. 01/16/2016 -- the patient has significant pain and has noticed despite in spite of all local care and oral pain medication. It is impossible to debride her in the office. 02/06/2016 -- I do not see any notes from Dr. Iran Planas( the patient has not made a call to the office know as she heard from them) and the only visit to recently was with her PCP Dr. Danella Penton -- I saw her on 01/16/2016 and prescribed 90 tablets of oxycodone 10 mg and did lab work and screening for HIV. the HIV was negative and hemoglobin was 6.3 with a WBC count of 14.9 and hematocrit of 17.8 with platelets of 561. reticulocyte count was  15.5% READMISSION: 07/10/2016- The patient is here for readmission for bilateral lower extremity ulcers in the presence of sickle cell. The bimalleolar ulcers to the right lower extremity have been present for approximately 2 years, the left medial malleolus ulcer has been present approximately 6 months. She has followed with Dr.Thimmappa in the past and has had a total of 3 applications of Theraskin (01/2015, 09/2015, 06/17/16). She has also followed with Dr. Con Memos here in the clinic and has received 2 applications of TheraSkin (11/10/15, 11/30/15). The patient does experience chronic, and is not amenable to debridement. 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 She is not currently on any antibiotic therapy and has not been treated with any recently. 07/17/2016 -- was seen by Dr. Iran Planas of plastic surgery who saw her 2 weeks postop application of Theraskin #3. She had removed her dressing and asked her to apply silver alginate on alternate days and follow-up back with the wound center. Future debridements and application of skin substitute would have to be done in the hospital due to her high risk for anesthesia. READMISSION 04/17/2021 Patient is now a 51 year old woman that we have had in this clinic for a prolonged period of time and 2016-2017 and then again for 2 visits in February 2018. At  that point she had wounds on the right lower leg predominantly medial. She had also been seen by plastic surgery Dr. Leland Johns who I believe took her to the OR for operative debridement and application of TheraSkin in 2017. After she left our clinic she was followed for a very prolonged period of time in the wound care center in [facility] who then referred her ultimately to [facility] where she was seen by Dr. Vernona Rieger. Again taken her to the OR for skin grafting which apparently did not take. She had multiple other attempts at dressings although I have not really looked  over all of these notes in great detail. She has not been seen in a wound care center in about a year. She states over the last year in addition to her right lower leg she has developed wounds on the left lower leg quite extensive. She is using Xeroform to all of these wounds without really any improvement. She also has Medicaid which does not cover wound products. The patient has had vascular work-ups in the past including most recently on 03/28/2021 showing biphasic waveforms on the right triphasic at the PTA and biphasic at the dorsalis pedis on the left. She was unable to tolerate any degree of compression to do ABIs. Unfortunately TBI's were also not done. She had venous reflux studies done in 2017. This did not show any evidence of a DVT or SVT and no venous incompetence was noted in the right leg at the time this was the only side with the wound As noted I did not look all over her old records. She apparently had a course of HBO and [facility] although I am not sure what the indication would have been. In any case she developed seizures and terminated treatment earlier. She is generally much more disabled than when we last saw her in clinic. She can no longer walk pretty much wheelchair-bound because predominantly of pain in the left hip. 04/24/2021; the patient tolerated the wraps we put on. We used Santyl and Hydrofera Blue under compression. I brought her back for a nurse visit for a change in dressing. With Medicaid we will have a hard time getting anything paid for and hence the need for compression. She arrives in clinic with all the wounds looking somewhat better in terms of surface 12/20; circumferential wound on the right from the lateral to the medial. She has open areas on the left medial and left lateral x2 on all of this with the same surface. This does not look completely healthy although she does have some epithelialization. She is not complaining of a lot of pain which is unusual for  her sickle ulcers. I have not looked over her extensive records from [facility]. She had recent arterial studies and has a history of venous reflux studies I will need to look these over although I do not believe she has significant arterial disease 2023 05/22/2021; patient's wound areas measure slightly smaller. Still a lot of drainage coming from the right we have been using Hydrofera Blue and Santyl with some improvement in the wound surfaces. She tells me she will be getting transfused later in the week for her underlying sickle cell anemia I have looked over her recent arterial studies which were done in the fall. This was in November and showed biphasic and triphasic waveforms but she could not tolerate ABIs because of pressure and unfortunately TBI's were not done. She has not had recent venous reflux studies that I can see  1/10; not much change about the same surface area. This has a yellowish surface to it very gritty. We have been using Santyl and Hydrofera Blue for a prolonged period. Culture I did last week showed methicillin sensitive staph aureus "rare". Our intake nurse reports greenish drainage which may be the Hydrofera Blue itself 1/17; wounds are continue to measure smaller although I am not sure about the accuracy here. Especially the areas on the right are covered in what looks to be a nonviable surface although she does have some epithelialization. Similarly she has areas on the left medial and left lateral ankle area which appear to have a better surface and perhaps are slightly smaller. We have been using Santyl and Hydrofera Blue. She cannot tolerate mechanical debridements She went for her reflux studies which showed significant reflux at the greater saphenous vein at the saphenofemoral junction as well as the greater saphenous vein in the proximal calf on the left she had reflux in the thigh and the common femoral vein and supra vein Fishel vein reflux in the greater saphenous  vein. I will have vein and vascular look at this. My thoughts have been that these are likely sickle wounds. I looked through her old records from [facility] wound care center and then when she graduated to [facility] wound care center where she saw Dr. Zigmund Daniel and Dr. Vernona Rieger. Although I can see she had reflux studies done I do not see that she actually saw a vein and vascular. I went over the fact that she had operative debridements and actual skin grafting that did not take. I do not think these wounds have ever really progressed towards healing 1/31;Substantial wounds on the right ankle area. Hyper granulated very gritty adherent debris on the surface. 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 On the left she has no DVT or SVT deep vein reflux in the common femoral vein. Superficial vein reflux in the greater saphenous vein on although the vein diameter is not really all that large. I do not think there is anything that can be done with these although I am going to send her for consultation to vein and vascular. 2/7; Wound exam; substantial wound area on the right posterior ankle area and areas on the left medial ankle and left lateral ankle. I was able to debride the left medial ankle last week fairly aggressively and it is back this week to a completely nonviable surface She will see vascular surgery this Friday and I would like them to review the venous studies and also any comments on her arterial status. If they do not see an issue here I am going to refer her to plastic surgery for an operative debridement perhaps intraoperative  ACell or Integra. Eventually she will require a deep tissue culture again 2/14; substantial wound area on the right posterior ankle, medial ankle. We have been using silver alginate The patient was seen by vein and vascular she had both venous reflux studies and arterial studies. In terms of the venous reflux studies she had a chronic DVT in the popliteal vein but no evidence of deep  vein reflux. She had no evidence of superficial venous thrombosis. She did have superficial vein reflux at the saphenofemoral junction and the greater saphenous vein. On the left no evidence of a DVT no evidence of superficial venous throat thrombosis she did have deep vein reflux in the common femoral vein and superficial vein reflux in the greater saphenous vein but these were not felt to be amenable to ablation. In terms of arterial studies she had triphasic and wife biphasic wave waveforms bilaterally not felt to have a significant arterial issue. I do not get the feeling that they felt that any part of her nonhealing wounds were related to either arterial or venous issues. They did note that she had venous reflux at the right at the [facility] and [facility]. And also on the left there were reflux in the deep system at the common femoral vein and greater saphenous vein in the proximal thigh. Nothing amenable to ablation. 2/20; she is making some decent progress on the right where there is nice skin between the 2 open areas on the right ankle. The surfaces here do not look viable yet there is some surrounding epithelialization. She still has a small area on the left medial ankle area. Hyper-granulated Jody's away always 2/28 patient has an appointment with plastic surgery on 3/8. We will see her back on 3/9. She may have to call us to get the area redressed. We've been using Santyl under silver alginate. We made a nice improvement on the left medial ankle. The larger wounds on the right also looks somewhat better in terms of  epithelialization although I think they could benefit from an aggressive debridement if plastic surgery would be willing to do that. Perhaps placement of Integra or a cell 07/26/2021: She saw Dr. Claudia Desanctis yesterday. He raised the question as to whether or not this might be pyoderma and wanted to wait until that question was answered by dermatology before proceeding with any sort of operative debridement. We have continue to use Santyl under silver alginate with Kerlix and Coban wraps. Overall, her wounds appear to be continuing to contract and epithelialize, with some granulation tissue present. There continues to be some slough on all wound surfaces. 08/09/2021: She has not been able to get an appointment with dermatology because apparently the offices in [place] do not accept Medicaid. She is looking into whether or not she can be seen at the main [facility] dermatology clinic. This is necessary because plastic surgery is concerned that her wounds might represent pyoderma and they did not want to do any procedure until that was clarified. We have been using Santyl under silver alginate with Kerlix and Coban wraps. Today, there was a greater amount of drainage on her dressings with a slight green discoloration and significant odor. Despite this, her wounds continue to contract and epithelialize. There is pale granulation tissue present and actually, on the left medial ankle, the granulation tissue is a bit hypertrophic. 08/16/2021: Last week, I took a culture and this grew back rare methicillin-resistant Staph aureus and rare corynebacterium. The MRSA was sensitive to gentamicin which we began applying topically on an empiric basis. This week, her wounds are a bit smaller and the drainage and odor are less. Her primary care provider is working on assisting the patient with a dermatology evaluation. She has been in silver alginate over the gentamicin that was started last week  along with Kerlix and Coban wraps. 08/23/2021: Because she has Medicaid, we have  been unable to get her into see any dermatologist in the [place] to rule out pyoderma gangrenosum, which was a requirement from plastic surgery prior to any sort of debridement and grafting. Despite this, however, all of her wounds continue to get smaller. The wound on her left medial ankle is nearly closed. There is no odor from the wounds, although she still accumulates a modest amount of drainage on her dressings. 08/30/2021: The lateral right ankle wound and the medial left ankle wound are a bit smaller today. The medial right ankle wound is about the same size. They are less tender. We have still been unable to get her into dermatology. 09/06/2021: All of the wounds are about the same size today. She continues to endorse minimal pain. I communicated with Dr. Claudia Desanctis in plastic surgery regarding our issues getting a dermatology appointment; he was out of town but indicated that he would look into perhaps performing the biopsy in his office and will have his office contact her. 09/14/2021: The patient has an appointment in dermatology, but it is not until October. Her wounds are roughly the same; she continues to have very thick purulent-looking drainage on her dressings. 09/20/2021: The left medial wound is nearly closed and just has a bit of accumulated eschar on the surface. The right medial and lateral ankle wounds are perhaps a little bit smaller. They continue to have a very pale surface with accumulation of thin slough. 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 I did not think [employer] was indicated based on this. She is getting topical mupirocin with Prisma silver collagen. 10/04/2021: The patient was not seen in clinic last week due to childcare coverage issues. In the interim, the left medial leg wound has closed. The right sided leg wounds are smaller. There is more granulation tissue coming  through, particularly on the lateral wound. The surface remains somewhat gritty. We have been applying topical mupirocin and Prisma silver collagen. 10/11/2021: The left medial leg wound remains closed. She does complain of some anesthetic sensation to the area. Both of the right-sided leg wounds are smaller but still have accumulated slough. 10/18/2021: Both right-sided leg wounds are minimally smaller this week. She still continues to accumulate slough and has thick drainage on her dressings. 10/23/2021: Both wounds continue to contract. There is still slough buildup. She has been approved for a keratin-based skin substitute trial product but it will not be available until next week. 10/30/2021: The wounds are about the same to perhaps slightly smaller. There is still continued slough buildup. Unfortunately, the rep for the keratin based product did not show up today and did not answer his phone when called. 11/08/2021: The wounds are little bit smaller today. She continues to have thick drainage but the surfaces are relatively clean with just a little bit of slough accumulation. She reported to me today that she is unable to completely flex her left ankle and on examination it seems this is potentially related to scar tissue from her wounds. We do have the ProgenaMatrix trial product available for her today. 11/15/2021: Both wounds are smaller today. There is some slough accumulation on the surfaces, but the medial wound, in particular looks like it is filling in and is less deep. She did hear from physical therapy and she is going to start working with them on July 11. She is here for her second application of the trial skin substitute, ProgenaMatrix. 11/22/2021: Both wounds continue to contract, the medial more  dramatically than the lateral. Both wounds have a layer of slough on the surface, but underneath this, the gritty fibrous tissue has a little bit more of a pink cast to it rather than being as pale  as it has been. 11/29/2021: The wounds are roughly the same size this week, perhaps a millimeter or 2 smaller. The medial wound has filled in and is nearly flush with the surrounding skin surface. She continues to have a lot of slough accumulation on both surfaces. 12/06/2021: No significant change to her wounds, but she has a new opening on her dorsal foot, just distal to the right lateral ankle wound. The area on her left medial ankle that reopened looks a little bit larger today. She has quite a bit of pain associated with the new wound. 12/12/2021: Her wounds look about the same but the new opening on her right lateral dorsal foot is a little bit bigger. She continues to have a fair amount of pain with this wound. Electronic Signature(s) Signed: 12/12/2021 11:56:20 AM By: Fredirick Maudlin MD FACS Entered By: Fredirick Maudlin on 12/12/2021 11:56:19 -------------------------------------------------------------------------------- Physical Exam Details Patient Name: Date of Service: [name] 12/12/2021 10:15 A M Medical Record Number: [id number] Patient Account Number: [id number] Date of Birth/Sex: Treating RN: [birth year] (51 y.o. [name] Primary Care Provider: [name] Other Clinician: Referring Provider: Treating Provider/Extender: Elyse Jarvis Weeks in Treatment: 34 Constitutional . . . . No acute distress.Marland Kitchen Respiratory Normal work of breathing on room air.. Notes 12/12/2021: Her wounds look about the same but the new opening on her right lateral dorsal foot is a little bit bigger. She continues to have a fair amount of pain with this wound. Electronic Signature(s) Signed: 12/12/2021 11:59:59 AM By: Fredirick Maudlin MD FACS Entered By: Fredirick Maudlin on 12/12/2021 11:59:59 -------------------------------------------------------------------------------- Physician Orders Details Patient Name: Date of Service: [name] 12/12/2021  10:15 A M Medical Record Number: [id number] Patient Account Number: [id number] Date of Birth/Sex: Treating RN: Jul 05, 1970 (51 y.o. [name] Primary Care Provider: [name] Other Clinician: Referring Provider: Treating Provider/Extender: Elyse Jarvis Weeks in Treatment: 22 Verbal / Phone Orders: No Diagnosis Coding ICD-10 Coding Code Description L97.818 Non-pressure chronic ulcer of other part of right lower leg with other specified severity L97.828 Non-pressure chronic ulcer of other part of left lower leg with other specified severity 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 Follow-up Appointments ppointment in 1 week. - Dr. Celine Ahr - Room 3 Wednesday August 9th at 10:15am Return A Other: - PROGENA MATRIX #6 Cellular or Tissue Based Products Other Cellular or Tissue Based Products Orders/Instructions: - PROGENA MATRIX#6 Bathing/ Shower/ Hygiene May shower with protection but do not get wound dressing(s) wet. - Can get cast protector bags at [facility] or [facility] Edema Control - Lymphedema / SCD / Other Elevate legs to the level of the heart or above for 30 minutes daily and/or when sitting, a frequency of: - throughout the day Avoid standing for long periods of time. Exercise regularly Additional Orders / Instructions Follow Nutritious Diet Wound Treatment Wound #17 - Lower Leg Wound Laterality: Right, Lateral Cleanser: Soap and Water 1 x Per Week/30 Days Discharge Instructions: May shower and wash wound with dial antibacterial soap and water prior to dressing change. Cleanser: Wound Cleanser 1 x Per Week/30 Days Discharge Instructions: Cleanse the wound with wound cleanser prior to applying a clean dressing using gauze sponges, not tissue or cotton balls. Peri-Wound Care: Triamcinolone 15 (g)  1 x Per Week/30 Days Discharge Instructions: Use triamcinolone 15 (g) as directed Peri-Wound Care: Sween Lotion (Moisturizing lotion) 1 x Per Week/30  Days Discharge Instructions: Apply moisturizing lotion as directed Topical: Mupirocin Ointment 1 x Per Week/30 Days Discharge Instructions: Apply Mupirocin (Bactroban) as instructed Prim Dressing: [place] ary 1 x Per Week/30 Days Secondary Dressing: ABD Pad, 5x9 1 x Per Week/30 Days Discharge Instructions: Apply over primary dressing as directed. Secondary Dressing: Zetuvit Plus 4x8 in 1 x Per Week/30 Days Discharge Instructions: Apply over primary dressing as directed. Compression Wrap: Kerlix Roll 4.5x3.1 (in/yd) 1 x Per Week/30 Days Discharge Instructions: Apply Kerlix and Coban compression as directed. Compression Wrap: Coban Self-Adherent Wrap 4x5 (in/yd) 1 x Per Week/30 Days Discharge Instructions: Apply over Kerlix as directed. Wound #21 - Ankle Wound Laterality: Right, Medial Cleanser: Soap and Water 1 x Per Week/30 Days Discharge Instructions: May shower and wash wound with dial antibacterial soap and water prior to dressing change. Cleanser: Wound Cleanser 1 x Per Week/30 Days Discharge Instructions: Cleanse the wound with wound cleanser prior to applying a clean dressing using gauze sponges, not tissue or cotton balls. Peri-Wound Care: Triamcinolone 15 (g) 1 x Per Week/30 Days Discharge Instructions: Use triamcinolone 15 (g) as directed Peri-Wound Care: Sween Lotion (Moisturizing lotion) 1 x Per Week/30 Days Discharge Instructions: Apply moisturizing lotion as directed Topical: Mupirocin Ointment 1 x Per Week/30 Days Discharge Instructions: Apply Mupirocin (Bactroban) as instructed Prim Dressing: [place] ary 1 x Per Week/30 Days Secondary Dressing: ABD Pad, 5x9 1 x Per Week/30 Days Discharge Instructions: Apply over primary dressing as directed. Secondary Dressing: Zetuvit Plus 4x8 in 1 x Per Week/30 Days Discharge Instructions: Apply over primary dressing as directed. Compression Wrap: Kerlix Roll 4.5x3.1 (in/yd) 1 x Per Week/30 Days Discharge Instructions:  Apply Kerlix and Coban compression as directed. Compression Wrap: Coban Self-Adherent Wrap 4x5 (in/yd) 1 x Per Week/30 Days Discharge Instructions: Apply over Kerlix as directed. Wound #22 - Ankle Wound Laterality: Left, Medial Cleanser: Soap and Water Discharge Instructions: May shower and wash wound with dial antibacterial soap and water prior to dressing change. Topical: Mupirocin Ointment Discharge Instructions: Apply Mupirocin (Bactroban) as instructed Prim Dressing: KerraCel Ag Gelling Fiber Dressing, 2x2 in (silver alginate) ary Discharge Instructions: Apply silver alginate to wound bed as instructed Secondary Dressing: Bordered Gauze, 2x2 in Discharge Instructions: Apply over primary dressing as directed. Patient Medications llergies: No Known Allergies A Notifications Medication Indication Start End 12/12/2021 mupirocin DOSE topical 2 % ointment - Apply to wound with each dressing change. Electronic Signature(s) Signed: 12/12/2021 12:38:36 PM By: Fredirick Maudlin MD FACS Previous Signature: 12/12/2021 12:02:15 PM Version By: Fredirick Maudlin MD FACS Entered By: Fredirick Maudlin on 12/12/2021 12:02:24 -------------------------------------------------------------------------------- Problem List Details Patient Name: Date of Service: [name] 12/12/2021 10:15 A M Medical Record Number: [id number] Patient Account Number: [id number] Date of Birth/Sex: Treating RN: 1970-07-15 (51 y.o. [name] Primary Care Provider: [name] Other Clinician: Referring Provider: Treating Provider/Extender: Elyse Jarvis Weeks in Treatment: 36 Active Problems ICD-10 Encounter Code Description Active Date MDM Diagnosis L97.818 Non-pressure chronic ulcer of other part of right lower leg with other specified 04/17/2021 No Yes severity L97.828 Non-pressure chronic ulcer of other part of left lower leg with other specified 04/17/2021 No  Yes severity 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 04/17/2021 No Yes Inactive Problems Resolved Problems Electronic Signature(s) Signed: 12/12/2021 11:55:09 AM By: Fredirick Maudlin MD FACS Entered By: Fredirick Maudlin on 12/12/2021 11:55:09 --------------------------------------------------------------------------------  Progress Note Details Patient Name: Date of Service: [name] [name] 12/12/2021 10:15 A M Medical Record Number: [id number] Patient Account Number: [id number] Date of Birth/Sex: Treating RN: 1970/09/07 (51 y.o. [name] Primary Care Provider: [name] Other Clinician: Referring Provider: Treating Provider/Extender: Elyse Jarvis Weeks in Treatment: 55 Subjective Chief Complaint Information obtained from Patient the patient is here for evaluation of her bilateral lower extremity sickle cell ulcers 04/17/2021; patient comes in for substantial wounds on the right and left lower leg History of Present Illness (HPI) The following HPI elements were documented for the patient's wound: Location: medial and lateral ankle region on the right and left medial malleolus Quality: Patient reports experiencing a shooting pain to affected area(s). Severity: Patient states wound(s) are getting worse. Duration: right lower extremity bimalleolar ulcers have been present for approximately 2 years; the rright meedial malleolus ulcer has been there proximally 6 months Timing: Pain in wound is constant (hurts all the time) Context: The wound would happen gradually Associated Signs and Symptoms: Patient reports having increase discharge. 51 year old patient with a history of sickle cell anemia who was last seen by me with ulceration of the right lower extremity above the ankle and was referred to Dr. Leland Johns for a surgical debridement as I was unable to do anything in the office due to excruciating pain. At that stage she was referred from the  plastic surgery service to dermatology who treated her for a skin infection with doxycycline and then Levaquin and a local antibiotic ointment. I understand the patient has since developed ulceration on the left ankle both medial and lateral and was now referred back to the wound center as dermatology has finished the management. I do not have any notes from the dermatology department Old notes: 51 year old patient with a history of sickle cell anemia, pain bilateral lower extremities, right lower extremity ulcer and has a history of receiving a skin graft( Theraskin) several months ago. She has been visiting the wound center [facility] and was seen by Dr. Dellia Nims and Dr. Leland Johns. after prolonged conservator therapy between July 2016 and January 2017. She had been seen by the plastic surgeon and taken to the OR for debridement and application of Theraskin. She had 3 applications of Theraskin and was then treated with collagen. Prior to that she had a history of similar problems in 2014 and was treated conservatively. Had a reflux study done for the right lower extremity in August 2016 without reflux or DVT . Past medical history significant for sickle cell disease, anemia, leg ulcers, cholelithiasis,and has never been a smoker. Once the patient was discharged on the wound center she says within 2 or 3 weeks the problems recurred and she has been treating it conservatively. since I saw her 3 weeks ago at [facility] she has been unable to get her dressing material but has completed a course of doxycycline. 6/7/ 2017 -- lower extremity venous duplex reflux evaluation was done oo No evidence of SVT or DVT in the RLL. No venous incompetence in the RLL. No further vascular workup is indicated at this time. She was seen by Dr. [name], on 10/04/2015. She agreed with the plan of taking her to the OR for debridement and application of theraskin and would also take biopsies to rule out pyoderma  gangrenosum. Follow-up note dated May 31 received and she was status post application of Theraskin to multiple ulcers around the right ankle. Pathology did not show evidence of malignancy or  pyoderma gangrenosum. She would continue to see as in the wound clinic for further care and see Dr. Leland Johns as needed. The patient brought the biopsy report and it was consistent with stasis ulcer no evidence of malignancy and the comment was that there was some adjacent neovascularization, fibrosis and patchy perivascular chronic inflammation. 11/15/2015 -- today we applied her first application of Theraskin 11/30/15; TheraSkin #2 12/13/2015 -- she is having a lot of pain locally and is here for possible application of a theraskin today. 01/16/2016 -- the patient has significant pain and has noticed despite in spite of all local care and oral pain medication. It is impossible to debride her in the office. 02/06/2016 -- I do not see any notes from Dr. Iran Planas( the patient has not made a call to the office know as she heard from them) and the only visit to recently was with her PCP Dr. Danella Penton -- I saw her on 01/16/2016 and prescribed 90 tablets of oxycodone 10 mg and did lab work and screening for HIV. the HIV was negative and hemoglobin was 6.3 with a WBC count of 14.9 and hematocrit of 17.8 with platelets of 561. 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 The bimalleolar ulcers to the right lower extremity have been present for approximately 2 years, the left medial malleolus ulcer has been present approximately 6 months. She has followed with Dr.Thimmappa in the past and has had a total of 3 applications of Theraskin (01/2015, 09/2015, 06/17/16). She has also followed with Dr. Con Memos here in the clinic and has received 2 applications of TheraSkin (11/10/15, 11/30/15). The patient does  experience chronic, and is not amenable to debridement. 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 She is not currently on any antibiotic therapy and has not been treated with any recently. 07/17/2016 -- was seen by Dr. Iran Planas of plastic surgery who saw her 2 weeks postop application of Theraskin #3. She had removed her dressing and asked her to apply silver alginate on alternate days and follow-up back with the wound center. Future debridements and application of skin substitute would have to be done in the hospital due to her high risk for anesthesia. READMISSION 04/17/2021 Patient is now a 52 year old woman that we have had in this clinic for a prolonged period of time and 2016-2017 and then again for 2 visits in February 2018. At that point she had wounds on the right lower leg predominantly medial. She had also been seen by plastic surgery Dr. Leland Johns who I believe took her to the OR for operative debridement and application of TheraSkin in 2017. After she left our clinic she was followed for a very prolonged period of time in the wound care center in [facility] who then referred her ultimately to [facility] where she was seen by Dr. Vernona Rieger. Again taken her to the OR for skin grafting which apparently did not take. She had multiple other attempts at dressings although I have not really looked over all of these notes in great detail. She has not been seen in a wound care center in about a year. She states over the last year in addition to her right lower leg she has developed wounds on the left lower leg quite extensive. She is using Xeroform to all of these wounds without really any improvement. She also has Medicaid which does  not cover wound products. The patient has had vascular work-ups in the past including most recently on 03/28/2021 showing biphasic waveforms on the right triphasic at the PTA and biphasic at the dorsalis pedis on the left. She was  unable to tolerate any degree of compression to do ABIs. Unfortunately TBI's were also not done. She had venous reflux studies done in 2017. This did not show any evidence of a DVT or SVT and no venous incompetence was noted in the right leg at the time this was the only side with the wound As noted I did not look all over her old records. She apparently had a course of HBO and [facility] although I am not sure what the indication would have been. In any case she developed seizures and terminated treatment earlier. She is generally much more disabled than when we last saw her in clinic. She can no longer walk pretty much wheelchair-bound because predominantly of pain in the left hip. 04/24/2021; the patient tolerated the wraps we put on. We used Santyl and Hydrofera Blue under compression. I brought her back for a nurse visit for a change in dressing. With Medicaid we will have a hard time getting anything paid for and hence the need for compression. She arrives in clinic with all the wounds looking somewhat better in terms of surface 12/20; circumferential wound on the right from the lateral to the medial. She has open areas on the left medial and left lateral x2 on all of this with the same surface. This does not look completely healthy although she does have some epithelialization. She is not complaining of a lot of pain which is unusual for her sickle ulcers. I have not looked over her extensive records from [facility]. She had recent arterial studies and has a history of venous reflux studies I will need to look these over although I do not believe she has significant arterial disease 2023 05/22/2021; patient's wound areas measure slightly smaller. Still a lot of drainage coming from the right we have been using Hydrofera Blue and Santyl with some improvement in the wound surfaces. She tells me she will be getting transfused later in the week for her underlying sickle cell anemia I have looked over her  recent arterial studies which were done in the fall. This was in November and showed biphasic and triphasic waveforms but she could not tolerate ABIs because of pressure and unfortunately TBI's were not done. She has not had recent venous reflux studies that I can see 1/10; not much change about the same surface area. This has a yellowish surface to it very gritty. We have been using Santyl and Hydrofera Blue for a prolonged period. Culture I did last week showed methicillin sensitive staph aureus "rare". Our intake nurse reports greenish drainage which may be the Hydrofera Blue itself 1/17; wounds are continue to measure smaller although I am not sure about the accuracy here. Especially the areas on the right are covered in what looks to be a nonviable surface although she does have some epithelialization. Similarly she has areas on the left medial and left lateral ankle area which appear to have a better surface and perhaps are slightly smaller. We have been using Santyl and Hydrofera Blue. She cannot tolerate mechanical debridements She went for her reflux studies which showed significant reflux at the greater saphenous vein at the saphenofemoral junction as well as the greater saphenous vein in the proximal calf on the left she had reflux in the  thigh and the common femoral vein and supra vein Fishel vein reflux in the greater saphenous vein. I will have vein and vascular look at this. My thoughts have been that these are likely sickle wounds. I looked through her old records from [facility] wound care center and then when she graduated to [facility] wound care center where she saw Dr. Zigmund Daniel and Dr. Vernona Rieger. Although I can see she had reflux studies done I do not see that she actually saw a vein and vascular. I went over the fact that she had operative debridements and actual skin grafting that did not take. I do not think these wounds have ever really progressed towards  healing 1/31;Substantial wounds on the right ankle area. Hyper granulated very gritty adherent debris on the surface. 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 On the left she has no DVT or SVT deep vein reflux in the common femoral vein. Superficial vein reflux in the greater saphenous vein on although the vein diameter is not really all that large. I do not think there is anything that can be done with these although I am going to send her for consultation to vein and vascular. 2/7; Wound exam; substantial wound area on the right posterior ankle area and areas on the left medial ankle and left lateral ankle. I was able to debride the left medial ankle last week fairly aggressively and it is back this week to a completely nonviable surface She will see vascular surgery this Friday and I would like them to review the venous studies and also any comments on her arterial status. If they do not see an issue here I am going to refer her to plastic surgery for an operative debridement perhaps intraoperative ACell or Integra. Eventually she will require a deep tissue culture again 2/14; substantial wound area on the right posterior ankle, medial ankle. We have been using silver alginate The patient was seen by vein and vascular she had both venous reflux studies and arterial studies. 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 She had no evidence of superficial venous thrombosis. She did have superficial vein reflux at the saphenofemoral junction and the greater saphenous  vein. On the left no evidence of a DVT no evidence of superficial venous throat thrombosis she did have deep vein reflux in the common femoral vein and superficial vein reflux in the greater saphenous vein but these were not felt to be amenable to ablation. In terms of arterial studies she had triphasic and wife biphasic wave waveforms bilaterally not felt to have a significant arterial issue. I do not get the feeling that they felt that any part of her nonhealing wounds were related to either arterial or venous issues. They did note that she had venous reflux at the right at the [facility] and [facility]. And also on the left there were reflux in the deep system at the common femoral vein and greater saphenous vein in the proximal thigh. Nothing amenable to ablation. 2/20; she is making some decent progress  on the right where there is nice skin between the 2 open areas on the right ankle. The surfaces here do not look viable yet there is some surrounding epithelialization. She still has a small area on the left medial ankle area. Hyper-granulated Jody's away always 2/28 patient has an appointment with plastic surgery on 3/8. We will see her back on 3/9. She may have to call us to get the area redressed. We've been using Santyl under silver alginate. We made a nice improvement on the left medial ankle. The larger wounds on the right also looks somewhat better in terms of epithelialization although I think they could benefit from an aggressive debridement if plastic surgery would be willing to do that. Perhaps placement of Integra or a cell 07/26/2021: She saw Dr. Claudia Desanctis yesterday. He raised the question as to whether or not this might be pyoderma and wanted to wait until that question was answered by dermatology before proceeding with any sort of operative debridement. We have continue to use Santyl under silver alginate with Kerlix and Coban wraps. Overall, her wounds appear to be continuing to contract and  epithelialize, with some granulation tissue present. There continues to be some slough on all wound surfaces. 08/09/2021: She has not been able to get an appointment with dermatology because apparently the offices in [place] do not accept Medicaid. She is looking into whether or not she can be seen at the main [facility] dermatology clinic. This is necessary because plastic surgery is concerned that her wounds might represent pyoderma and they did not want to do any procedure until that was clarified. We have been using Santyl under silver alginate with Kerlix and Coban wraps. Today, there was a greater amount of drainage on her dressings with a slight green discoloration and significant odor. Despite this, her wounds continue to contract and epithelialize. There is pale granulation tissue present and actually, on the left medial ankle, the granulation tissue is a bit hypertrophic. 08/16/2021: Last week, I took a culture and this grew back rare methicillin-resistant Staph aureus and rare corynebacterium. The MRSA was sensitive to gentamicin which we began applying topically on an empiric basis. This week, her wounds are a bit smaller and the drainage and odor are less. Her primary care provider is working on assisting the patient with a dermatology evaluation. She has been in silver alginate over the gentamicin that was started last week along with Kerlix and Coban wraps. 08/23/2021: Because she has Medicaid, we have been unable to get her into see any dermatologist in the [place] to rule out pyoderma gangrenosum, which was a requirement from plastic surgery prior to any sort of debridement and grafting. Despite this, however, all of her wounds continue to get smaller. The wound on her left medial ankle is nearly closed. There is no odor from the wounds, although she still accumulates a modest amount of drainage on her dressings. 08/30/2021: The lateral right ankle wound and the  medial left ankle wound are a bit smaller today. The medial right ankle wound is about the same size. They are less tender. We have still been unable to get her into dermatology. 09/06/2021: All of the wounds are about the same size today. She continues to endorse minimal pain. I communicated with Dr. Claudia Desanctis in plastic surgery regarding our issues getting a dermatology appointment; he was out of town but indicated that he would look into perhaps performing the biopsy in his office and will have his  office contact her. 09/14/2021: The patient has an appointment in dermatology, but it is not until October. Her wounds are roughly the same; she continues to have very thick purulent-looking drainage on her dressings. 09/20/2021: The left medial wound is nearly closed and just has a bit of accumulated eschar on the surface. The right medial and lateral ankle wounds are perhaps a little bit smaller. They continue to have a very pale surface with accumulation of thin slough. 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 I did not think [employer] was indicated based on this. She is getting topical mupirocin with Prisma silver collagen. 10/04/2021: The patient was not seen in clinic last week due to childcare coverage issues. In the interim, the left medial leg wound has closed. The right sided leg wounds are smaller. There is more granulation tissue coming through, particularly on the lateral wound. The surface remains somewhat gritty. We have been applying topical mupirocin and Prisma silver collagen. 10/11/2021: The left medial leg wound remains closed. She does complain of some anesthetic sensation to the area. Both of the right-sided leg wounds are smaller but still have accumulated slough. 10/18/2021: Both right-sided leg wounds are minimally smaller this week. She still continues to accumulate slough and has thick drainage on her dressings. 10/23/2021: Both wounds continue to contract. There  is still slough buildup. She has been approved for a keratin-based skin substitute trial product but it will not be available until next week. 10/30/2021: The wounds are about the same to perhaps slightly smaller. There is still continued slough buildup. Unfortunately, the rep for the keratin based product did not show up today and did not answer his phone when called. 11/08/2021: The wounds are little bit smaller today. She continues to have thick drainage but the surfaces are relatively clean with just a little bit of slough accumulation. She reported to me today that she is unable to completely flex her left ankle and on examination it seems this is potentially related to scar tissue from her wounds. We do have the ProgenaMatrix trial product available for her today. 11/15/2021: Both wounds are smaller today. There is some slough accumulation on the surfaces, but the medial wound, in particular looks like it is filling in and is less deep. She did hear from physical therapy and she is going to start working with them on July 11. She is here for her second application of the trial skin substitute, ProgenaMatrix. 11/22/2021: Both wounds continue to contract, the medial more dramatically than the lateral. Both wounds have a layer of slough on the surface, but underneath this, the gritty fibrous tissue has a little bit more of a pink cast to it rather than being as pale as it has been. 11/29/2021: The wounds are roughly the same size this week, perhaps a millimeter or 2 smaller. The medial wound has filled in and is nearly flush with the surrounding skin surface. She continues to have a lot of slough accumulation on both surfaces. 12/06/2021: No significant change to her wounds, but she has a new opening on her dorsal foot, just distal to the right lateral ankle wound. The area on her left medial ankle that reopened looks a little bit larger today. She has quite a bit of pain associated with the new  wound. 12/12/2021: Her wounds look about the same but the new opening on her right lateral dorsal foot is a little bit bigger. She continues to have a fair amount of pain  with this wound. Patient History Information obtained from Patient. Family History Diabetes - Mother, Lung Disease - Mother, No family history of Cancer, Heart Disease, Hereditary Spherocytosis, Hypertension, Kidney Disease, Seizures, Stroke, Thyroid Problems, Tuberculosis. Social History Never smoker, Marital Status - Married, Alcohol Use - Never, Drug Use - No History, Caffeine Use - Daily. Medical History Eyes Denies history of Cataracts, Glaucoma, Optic Neuritis Ear/Nose/Mouth/Throat Denies history of Chronic sinus problems/congestion, Middle ear problems Hematologic/Lymphatic Patient has history of Anemia, Sickle Cell Disease Denies history of Hemophilia, Human Immunodeficiency Virus, Lymphedema Respiratory Denies history of Aspiration, Asthma, Chronic Obstructive Pulmonary Disease (COPD), Pneumothorax, Sleep Apnea, Tuberculosis Cardiovascular Denies history of Angina, Arrhythmia, Congestive Heart Failure, Coronary Artery Disease, Deep Vein Thrombosis, Hypertension, Hypotension, Myocardial Infarction, Peripheral Arterial Disease, Peripheral Venous Disease, Phlebitis, Vasculitis Gastrointestinal Denies history of Cirrhosis , Colitis, Crohnoos, Hepatitis A, Hepatitis B, Hepatitis C Endocrine Denies history of Type I Diabetes, Type II Diabetes Genitourinary Denies history of End Stage Renal Disease Immunological Denies history of Lupus Erythematosus, Raynaudoos, Scleroderma Integumentary (Skin) Denies history of History of Burn Musculoskeletal Denies history of Gout, Rheumatoid Arthritis, Osteoarthritis, Osteomyelitis Neurologic Patient has history of Neuropathy - right foot intermittant Denies history of Dementia, Quadriplegia, Paraplegia, Seizure Disorder Oncologic Denies history of Received Chemotherapy,  Received Radiation Psychiatric Denies history of Anorexia/bulimia, Confinement Anxiety Hospitalization/Surgery History - c section x2. - left breast lumpectomy. - iandD right ankle with theraskin. Medical A Surgical History Notes nd Constitutional Symptoms (General Health) H/O miscarriage Cardiovascular bradycardia Gastrointestinal cholilithiasis Objective Constitutional No acute distress.. Vitals Time Taken: 10:45 AM, Height: 67 in, Temperature: 98.2 F, Pulse: 69 bpm, Respiratory Rate: 14 breaths/min, Blood Pressure: 127/64 mmHg. Respiratory Normal work of breathing on room air.. General Notes: 12/12/2021: Her wounds look about the same but the new opening on her right lateral dorsal foot is a little bit bigger. She continues to have a fair amount of pain with this wound. Integumentary (Hair, Skin) Wound #17 status is Open. Original cause of wound was Gradually Appeared. The date acquired was: 10/05/2012. The wound has been in treatment 34 weeks. The wound is located on the Right,Lateral Lower Leg. The wound measures 5.8cm length x 4.8cm width x 0.1cm depth; 21.865cm^2 area and 2.187cm^3 volume. There is Fat Layer (Subcutaneous Tissue) exposed. There is no tunneling or undermining noted. There is a medium amount of purulent drainage noted. The wound margin is flat and intact. There is large (67-100%) pink, pale granulation within the wound bed. There is a small (1-33%) amount of necrotic tissue within the wound bed including Adherent Slough. Wound #21 status is Open. Original cause of wound was Gradually Appeared. The date acquired was: 06/26/2021. The wound has been in treatment 24 weeks. The wound is located on the Right,Medial Ankle. The wound measures 6cm length x 2.8cm width x 0.1cm depth; 13.195cm^2 area and 1.319cm^3 volume. There is Fat Layer (Subcutaneous Tissue) exposed. There is no tunneling or undermining noted. There is a medium amount of purulent drainage noted. The  wound margin is distinct with the outline attached to the wound base. There is medium (34-66%) pink granulation within the wound bed. There is a medium (34-66%) amount of necrotic tissue within the wound bed including Adherent Slough. Wound #22 status is Open. Original cause of wound was Gradually Appeared. The date acquired was: 11/29/2021. The wound has been in treatment 1 weeks. The wound is located on the Left,Medial Ankle. The wound measures 0.3cm length x 0.3cm width x 0.1cm depth; 0.071cm^2 area and 0.007cm^3 volume.  There is Fat Layer (Subcutaneous Tissue) exposed. There is no tunneling or undermining noted. There is a medium amount of serosanguineous drainage noted. There is large (67-100%) pink granulation within the wound bed. There is a small (1-33%) amount of necrotic tissue within the wound bed including Adherent Slough. Wound #23 status is Open. Original cause of wound was Gradually Appeared. The date acquired was: 12/06/2021. The wound is located on the Right,Distal,Lateral Ankle. The wound measures 2cm length x 0.3cm width x 0.2cm depth; 0.471cm^2 area and 0.094cm^3 volume. There is Fat Layer (Subcutaneous Tissue) exposed. There is no tunneling or undermining noted. There is a medium amount of serosanguineous drainage noted. There is medium (34-66%) pink granulation within the wound bed. There is a medium (34-66%) amount of necrotic tissue within the wound bed including Adherent Slough. 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 There was a Excisional Skin/Subcutaneous Tissue Debridement with a total area of 27.84 sq cm performed by Fredirick Maudlin, MD. With  the following instrument(s): Curette to remove Non-Viable tissue/material. Material removed includes Subcutaneous Tissue and Slough and after achieving pain control using Lidocaine 5% topical ointment. No specimens were taken. A time out was conducted at 11:18, prior to the start of the procedure. A Minimum amount of bleeding was controlled with Pressure. The procedure was tolerated well with a pain level of 0 throughout and a pain level of 0 following the procedure. Post Debridement Measurements: 5.8cm length x 4.8cm width x 0.1cm depth; 2.187cm^3 volume. Character of Wound/Ulcer Post Debridement is improved. Post procedure Diagnosis Wound #17: Same as Pre-Procedure Pre-procedure diagnosis of Wound #17 is a Sickle Cell Lesion located on the Right,Lateral Lower Leg. A skin graft procedure using a bioengineered skin substitute/cellular or tissue based product was performed by Fredirick Maudlin, MD with the following instrument(s): Forceps and Scissors. 36 sq cm of product was utilized and 0 sq cm was wasted. Post Application, Adaptic;gauze was applied. A Time Out was conducted at 11:18, prior to the start of the procedure. The procedure was tolerated well with a pain level of 0 throughout and a pain level of 0 following the procedure. Post procedure Diagnosis Wound #17: Same as Pre-Procedure General Notes: Progena matrix*** trial***. Wound #21 Pre-procedure diagnosis of Wound #21 is a Sickle Cell Lesion located on the Right,Medial Ankle .Severity of Tissue Pre Debridement is: Fat layer exposed. There was a Selective/Open Wound Non-Viable Tissue Debridement with a total area of 16.8 sq cm performed by Fredirick Maudlin, MD. With the following instrument(s): Curette to remove Non-Viable tissue/material. Material removed includes Eschar and Slough and after achieving pain control using Lidocaine 5% topical ointment. No specimens were taken. A time out was conducted at 11:18, prior to the start of the  procedure. A Minimum amount of bleeding was controlled with Pressure. The procedure was tolerated well with a pain level of 0 throughout and a pain level of 0 following the procedure. Post Debridement Measurements: 6cm length x 2.8cm width x 0.1cm depth; 1.319cm^3 volume. Character of Wound/Ulcer Post Debridement is improved. Severity of Tissue Post Debridement is: Fat layer exposed. Post procedure Diagnosis Wound #21: Same as Pre-Procedure Pre-procedure diagnosis of Wound #21 is a Sickle Cell Lesion located on the Right,Medial Ankle. A  skin graft procedure using a bioengineered skin substitute/cellular or tissue based product was performed by Fredirick Maudlin, MD with the following instrument(s): Forceps and Scissors. 16 sq cm of product was utilized and 0 sq cm was wasted. Post Application, Adaptic;gauze was applied. A Time Out was conducted at 11:18, prior to the start of the procedure. The procedure was tolerated well with a pain level of 0 throughout and a pain level of 0 following the procedure. Post procedure Diagnosis Wound #21: Same as Pre-Procedure General Notes: Progena matrix**** trial***. Wound #22 Pre-procedure diagnosis of Wound #22 is a Lesion located on the Left,Medial Ankle . There was a Selective/Open Wound Non-Viable Tissue Debridement with a total area of 0.09 sq cm performed by Fredirick Maudlin, MD. With the following instrument(s): Curette to remove Non-Viable tissue/material. Material removed includes Eschar after achieving pain control using Lidocaine 5% topical ointment. No specimens were taken. A time out was conducted at 11:18, prior to the start of the procedure. A Minimum amount of bleeding was controlled with Pressure. The procedure was tolerated well with a pain level of 0 throughout and a pain level of 0 following the procedure. Post Debridement Measurements: 0.3cm length x 0.3cm width x 0.3cm depth; 0.021cm^3 volume. Character of Wound/Ulcer Post Debridement is  improved. Post procedure Diagnosis Wound #22: Same as Pre-Procedure Wound #23 Pre-procedure diagnosis of Wound #23 is a T be determined located on the Right,Distal,Lateral Ankle . There was a Excisional Skin/Subcutaneous Tissue o Debridement with a total area of 0.6 sq cm performed by Fredirick Maudlin, MD. With the following instrument(s): Curette to remove Non-Viable tissue/material. Material removed includes Subcutaneous Tissue and Slough and after achieving pain control using Lidocaine 5% topical ointment. No specimens were taken. A time out was conducted at 11:18, prior to the start of the procedure. A Minimum amount of bleeding was controlled with Pressure. The procedure was tolerated well with a pain level of 0 throughout and a pain level of 0 following the procedure. Post Debridement Measurements: 2cm length x 0.3cm width x 0.2cm depth; 0.094cm^3 volume. Character of Wound/Ulcer Post Debridement is improved. Post procedure Diagnosis Wound #23: Same as Pre-Procedure Plan Follow-up Appointments: Return Appointment in 1 week. - Dr. Celine Ahr - Room 3 Wednesday August 9th at 10:15am Other: - PROGENA MATRIX #6 Cellular or Tissue Based Products: Other Cellular or Tissue Based Products Orders/Instructions: - [employer]#6 Bathing/ Shower/ Hygiene: May shower with protection but do not get wound dressing(s) wet. - Can get cast protector bags at [facility] or [facility] Edema Control - Lymphedema / SCD / Other: Elevate legs to the level of the heart or above for 30 minutes daily and/or when sitting, a frequency of: - throughout the day Avoid standing for long periods of time. Exercise regularly Additional Orders / Instructions: Follow Nutritious Diet The following medication(s) was prescribed: mupirocin topical 2 % ointment Apply to wound with each dressing change. starting 12/12/2021 WOUND #17: - Lower Leg Wound Laterality: Right, Lateral Cleanser: Soap and Water 1 x Per Week/30 Days Discharge  Instructions: May shower and wash wound with dial antibacterial soap and water prior to dressing change. Cleanser: Wound Cleanser 1 x Per Week/30 Days Discharge Instructions: Cleanse the wound with wound cleanser prior to applying a clean dressing using gauze sponges, not tissue or cotton balls. Peri-Wound Care: Triamcinolone 15 (g) 1 x Per Week/30 Days Discharge Instructions: Use triamcinolone 15 (g) as directed Peri-Wound Care: Sween Lotion (Moisturizing lotion) 1 x Per Week/30 Days Discharge Instructions: Apply moisturizing lotion as directed  Topical: Mupirocin Ointment 1 x Per Week/30 Days Discharge Instructions: Apply Mupirocin (Bactroban) as instructed Prim Dressing: PROGENA MATRIX 1 x Per Week/30 Days ary Secondary Dressing: ABD Pad, 5x9 1 x Per Week/30 Days Discharge Instructions: Apply over primary dressing as directed. Secondary Dressing: Zetuvit Plus 4x8 in 1 x Per Week/30 Days Discharge Instructions: Apply over primary dressing as directed. Com pression Wrap: Kerlix Roll 4.5x3.1 (in/yd) 1 x Per Week/30 Days Discharge Instructions: Apply Kerlix and Coban compression as directed. Com pression Wrap: Coban Self-Adherent Wrap 4x5 (in/yd) 1 x Per Week/30 Days Discharge Instructions: Apply over Kerlix as directed. WOUND #21: - Ankle Wound Laterality: Right, Medial Cleanser: Soap and Water 1 x Per Week/30 Days Discharge Instructions: May shower and wash wound with dial antibacterial soap and water prior to dressing change. Cleanser: Wound Cleanser 1 x Per Week/30 Days Discharge Instructions: Cleanse the wound with wound cleanser prior to applying a clean dressing using gauze sponges, not tissue or cotton balls. Peri-Wound Care: Triamcinolone 15 (g) 1 x Per Week/30 Days Discharge Instructions: Use triamcinolone 15 (g) as directed Peri-Wound Care: Sween Lotion (Moisturizing lotion) 1 x Per Week/30 Days Discharge Instructions: Apply moisturizing lotion as directed Topical: Mupirocin  Ointment 1 x Per Week/30 Days Discharge Instructions: Apply Mupirocin (Bactroban) as instructed Prim Dressing: PROGENA MATRIX 1 x Per Week/30 Days ary Secondary Dressing: ABD Pad, 5x9 1 x Per Week/30 Days Discharge Instructions: Apply over primary dressing as directed. Secondary Dressing: Zetuvit Plus 4x8 in 1 x Per Week/30 Days Discharge Instructions: Apply over primary dressing as directed. Com pression Wrap: Kerlix Roll 4.5x3.1 (in/yd) 1 x Per Week/30 Days Discharge Instructions: Apply Kerlix and Coban compression as directed. Com pression Wrap: Coban Self-Adherent Wrap 4x5 (in/yd) 1 x Per Week/30 Days Discharge Instructions: Apply over Kerlix as directed. WOUND #22: - Ankle Wound Laterality: Left, Medial Cleanser: Soap and Water Discharge Instructions: May shower and wash wound with dial antibacterial soap and water prior to dressing change. Topical: Mupirocin Ointment Discharge Instructions: Apply Mupirocin (Bactroban) as instructed Prim Dressing: KerraCel Ag Gelling Fiber Dressing, 2x2 in (silver alginate) ary Discharge Instructions: Apply silver alginate to wound bed as instructed Secondary Dressing: Bordered Gauze, 2x2 in Discharge Instructions: Apply over primary dressing as directed. 12/12/2021: Her wounds look about the same but the new opening on her right lateral dorsal foot is a little bit bigger. She continues to have a fair amount of pain with this wound. I used a curette to debride slough from each of the wounds. There is also some eschar on the medial left ankle that I removed. 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 I then I applied ProgenaMatrix, donated skin substitute, to the wounds on her right leg. This was secured in place with Adaptic and Steri-Strips. We will also have her apply mupirocin to her left medial ankle wound which she is covering  with silver alginate. The skin substitute can remain in place for 2 weeks and so I will have her follow-up then. Electronic Signature(s) Signed: 12/14/2021 8:11:59 AM By: Fredirick Maudlin MD FACS Previous Signature: 12/12/2021 12:03:55 PM Version By: Fredirick Maudlin MD FACS Previous Signature: 12/12/2021 12:01:13 PM Version By: Fredirick Maudlin MD FACS Entered By: Fredirick Maudlin on 12/14/2021 08:11:59 -------------------------------------------------------------------------------- HxROS Details Patient Name: Date of Service: [name] 12/12/2021 10:15 A M Medical Record Number: [id number] Patient Account Number: [id number] Date  of Birth/Sex: Treating RN: 1971-05-15 (51 y.o. [name] Primary Care Provider: Other Clinician: [name] Referring Provider: Treating Provider/Extender: Elyse Jarvis Weeks in Treatment: 13 Information Obtained From Patient Constitutional Symptoms (General Health) Medical History: Past Medical History Notes: H/O miscarriage Eyes Medical History: Negative for: Cataracts; Glaucoma; Optic Neuritis Ear/Nose/Mouth/Throat Medical History: Negative for: Chronic sinus problems/congestion; Middle ear problems Hematologic/Lymphatic Medical History: Positive for: Anemia; Sickle Cell Disease Negative for: Hemophilia; Human Immunodeficiency Virus; Lymphedema Respiratory Medical History: Negative for: Aspiration; Asthma; Chronic Obstructive Pulmonary Disease (COPD); Pneumothorax; Sleep Apnea; Tuberculosis Cardiovascular Medical History: Negative for: Angina; Arrhythmia; Congestive Heart Failure; Coronary Artery Disease; Deep Vein Thrombosis; Hypertension; Hypotension; Myocardial Infarction; Peripheral Arterial Disease; Peripheral Venous Disease; Phlebitis; Vasculitis Past Medical History Notes: bradycardia Gastrointestinal Medical History: Negative for: Cirrhosis ; Colitis; Crohns; Hepatitis A; Hepatitis B; Hepatitis  C Past Medical History Notes: cholilithiasis Endocrine Medical History: Negative for: Type I Diabetes; Type II Diabetes Genitourinary Medical History: Negative for: End Stage Renal Disease Immunological Medical History: Negative for: Lupus Erythematosus; Raynauds; Scleroderma Integumentary (Skin) Medical History: Negative for: History of Burn Musculoskeletal Medical History: Negative for: Gout; Rheumatoid Arthritis; Osteoarthritis; Osteomyelitis Neurologic Medical History: Positive for: Neuropathy - right foot intermittant Negative for: Dementia; Quadriplegia; Paraplegia; Seizure Disorder Oncologic Medical History: Negative for: Received Chemotherapy; Received Radiation Psychiatric Medical History: Negative for: Anorexia/bulimia; Confinement Anxiety Immunizations Pneumococcal Vaccine: Received Pneumococcal Vaccination: No Implantable Devices None Hospitalization / Surgery History Type of Hospitalization/Surgery c section x2 left breast lumpectomy iandD right ankle with theraskin Family and Social History Cancer: No; Diabetes: Yes - Mother; Heart Disease: No; Hereditary Spherocytosis: No; Hypertension: No; Kidney Disease: No; Lung Disease: Yes - Mother; Seizures: No; Stroke: No; Thyroid Problems: No; Tuberculosis: No; Never smoker; Marital Status - Married; Alcohol Use: Never; Drug Use: No History; Caffeine Use: Daily; Financial Concerns: No; Food, Clothing or Shelter Needs: No; Support System Lacking: No; Transportation Concerns: No Engineer, maintenance) Signed: 12/12/2021 12:38:36 PM By: Fredirick Maudlin MD FACS Signed: 12/12/2021 6:00:07 PM By: Dellie Catholic RN Entered By: Fredirick Maudlin on 12/12/2021 11:56:25 -------------------------------------------------------------------------------- SuperBill Details Patient Name: Date of Service: [name] [name], [name] 12/12/2021 Medical Record Number: [id number] Patient Account Number: [id number] Date of  Birth/Sex: Treating RN: [birth year] (51 y.o. [name] Primary Care Provider: [name] Other Clinician: Referring Provider: Treating Provider/Extender: Elyse Jarvis Weeks in Treatment: 34 Diagnosis Coding ICD-10 Codes Code Description [phone] Non-pressure chronic ulcer of other part of right lower leg with other specified severity L97.828 Non-pressure chronic ulcer of other part of left lower leg with other specified severity 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 Facility Procedures CPT4 Code: [id number] Description: 16967 - SKIN SUB GRAFT TRNK/ARM/LEG ICD-10 Diagnosis Description L97.818 Non-pressure chronic ulcer of other part of right lower leg with other specified s Modifier: everity Quantity: 1 Physician Procedures : CPT4 Code Description Modifier [id number] 99214 - WC PHYS LEVEL 4 - EST PT 25 ICD-10 Diagnosis Description L97.818 Non-pressure chronic ulcer of other part of right lower leg with other specified severity L97.828 Non-pressure chronic ulcer of other part  of left lower leg with other specified severity 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 Quantity: 1 : [id number] 15271 - WC PHYS SKIN SUB GRAFT TRNK/ARM/LEG ICD-10 Diagnosis Description L97.818 Non-pressure chronic ulcer of other part of right lower leg with other specified severity Quantity: 1 : [id number] 97597 - WC PHYS DEBR WO ANESTH 20 SQ CM ICD-10 Diagnosis Description L97.828 Non-pressure chronic ulcer of other part of left lower leg with other specified  severity Quantity: 1 Electronic Signature(s) Signed: 12/12/2021 12:04:48 PM By: Fredirick Maudlin MD FACS Entered By: Fredirick Maudlin on 12/12/2021 12:04:47

## 2021-12-14 ENCOUNTER — Ambulatory Visit: Payer: Medicaid Other

## 2021-12-14 DIAGNOSIS — M6281 Muscle weakness (generalized): Secondary | ICD-10-CM

## 2021-12-14 DIAGNOSIS — R2689 Other abnormalities of gait and mobility: Secondary | ICD-10-CM

## 2021-12-14 DIAGNOSIS — M25572 Pain in left ankle and joints of left foot: Secondary | ICD-10-CM | POA: Diagnosis not present

## 2021-12-14 DIAGNOSIS — M25672 Stiffness of left ankle, not elsewhere classified: Secondary | ICD-10-CM

## 2021-12-14 NOTE — Therapy (Signed)
OUTPATIENT PHYSICAL THERAPY TREATMENT NOTE/4th VISIT RE-ASSESSMENT   Patient Name: [name] MRN: [id number] DOB:12/26/1970, 51 y.o., female Today's Date: 12/14/2021  PCP:  Hollis, Lachina M, FNP  REFERRING PROVIDER: Cannon, Jennifer, MD  END OF SESSION:   PT End of Session - 12/14/21 1401     Visit Number 5    Number of Visits 16    Date for PT Re-Evaluation 01/22/22    Authorization Type [place] MEDICAID HEALTHY BLUE    Authorization - Visit Number 2    Authorization - Number of Visits 4    PT Start Time 1345    PT Stop Time 1430    PT Time Calculation (min) 45 min    Activity Tolerance Patient tolerated treatment well    Behavior During Therapy WFL for tasks assessed/performed              Past Medical History:  Diagnosis Date   Anemia 03/30/2012   Hx of sickle cell disease   CAP (community acquired pneumonia)    2014   Cholelithiasis 03/30/2012   Chronic wound of extremity 04/01/2012   Elevated LFTs    Leg ulcer (HCC) 10/27/2012   Chronic under care of wound clinic   Multiple open wounds of lower extremity    chronic wounds B/LLE   Sickle cell disease (HCC)    Sinus bradycardia by electrocardiogram 04/03/2012   Past Surgical History:  Procedure Laterality Date   ALLOGRAFT APPLICATION Right 02/14/2015   Procedure: SURGICAL PREP FOR GRAFTING RIGHT LOWER EXTREMITY AND APPLICATION OF THERASKIN;  Surgeon: Brinda Thimmappa, MD;  Location: [facility];  Service: Plastics;  Laterality: Right;   APPLICATION OF A-CELL OF EXTREMITY Right 10/09/2015   Procedure: APPLICATION OF THERASKIN;  Surgeon: Brinda Thimmappa, MD;  Location: [facility];  Service: Plastics;  Laterality: Right;   BREAST SURGERY     left breast cyst aspiration   CESAREAN SECTION     CESAREAN SECTION N/A 01/06/2014   Procedure: CESAREAN SECTION;  Surgeon: Myra C Dove, MD;  Location: [facility];  Service: Obstetrics;  Laterality: N/A;   CESAREAN SECTION N/A 05/04/2017    Procedure: CESAREAN SECTION;  Surgeon: Arnold, James G, MD;  Location: [facility];  Service: Obstetrics;  Laterality: N/A;   I & D EXTREMITY Right 10/09/2015   Procedure: SURGICAL PREPARATION FOR GRAFTING RIGHT ANKLE AND APPLICATION THERASKIN;  Surgeon: Brinda Thimmappa, MD;  Location:  SURGERY CENTER;  Service: Plastics;  Laterality: Right;   SKIN FULL THICKNESS GRAFT Bilateral 06/17/2016   Procedure: SURGICAL PREP FOR GRAFTING, BILATERAL LOWER EXTREMITIES AND APPLICATION OF THERASKIN;  Surgeon: Brinda Thimmappa, MD;  Location: [facility];  Service: Plastics;  Laterality: Bilateral;   TONSILLECTOMY     Patient Active Problem List   Diagnosis Date Noted   Protein-calorie malnutrition, severe 04/06/2021   Bilateral leg ulcer (HCC) 03/28/2021   Low hemoglobin 05/01/2020   At risk for seizures due to oxygen toxicity 02/09/2020   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   Venous stasis 01/19/2020   Partial loss of skin graft 01/12/2020   Acute respiratory failure with hypoxia (HCC) 12/15/2019   Bone infarction of distal tibia, 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   Sickle cell anemia with crisis (HCC) 09/03/2018   Other chronic pain    Sickle cell crisis (HCC) 06/26/2018   Sickle cell pain crisis (HCC) 04/07/2018   Avascular necrosis of bone of hip, left (HCC) 12/08/2017   Avascular necrosis of bone of hip, right (HCC) 12/08/2017   Hb-SS   disease without crisis ([place]) 11/26/2017   Anemia of chronic disease 11/26/2017   Abnormal quad screen    Ringworm of body 04/05/2017   Ulcer of right lower extremity ([place])    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[place]) 03/02/2017   History of pre-eclampsia 12/16/2016   Previous cesarean delivery x 2 07/22/2013   Hemochromatosis 11/10/2012   Chronic pain syndrome 26/37/8588   Complicated wound infection 04/01/2012   Leukocytosis 03/30/2012    REFERRING DIAG: Decreased L ankle ROM  THERAPY DIAG: Decreased L ankle ROM   Rationale for Evaluation and  Treatment Rehabilitation  PERTINENT HISTORY: Sickle Cell disease.   PRECAUTIONS: None  SUBJECTIVE: Continued moderate ankle soreness and limited mobility, some related to ongoing L hip pathology  PAIN:  Are you having pain? Yes: NPRS scale: 2/10 Pain location: L ankle Pain description: Pulling sensation Aggravating factors: Walking, squatting, stairs Relieving factors: Sitting, laying down, pain medication.   OBJECTIVE:    DIAGNOSTIC FINDINGS: None   PATIENT SURVEYS:  LEFS 19/80; 12/11/21 17/80   COGNITION:           Overall cognitive status: Within functional limits for tasks assessed                          SENSATION: WFL   POSTURE: No Significant postural limitations   PALPATION: None.    LOWER EXTREMITY ROM:   Active ROM Right AROM eval Left AROM eval Left PROM  Hip flexion WFL Mod limitation    Hip extension        Hip abduction Min limitation Severe limitation    Hip adduction WFL Severe limitation    Hip internal rotation Mod limitation Severe limitation    Hip external rotation Min limitation Severe limitation    Knee flexion [facility] WFL    Knee extension [facility] WFL    Ankle dorsiflexion NT  -10 (lacking) -8  Ankle plantarflexion NT  50    Ankle inversion NT  18    Ankle eversion NT  20     (Blank rows = not tested)   LOWER EXTREMITY MMT:   MMT Right eval Left eval  Hip flexion 5 3+  Knee flexion NT  5  Knee extension NT  5  Ankle dorsiflexion NT  4+  Ankle plantarflexion NT  4+  Ankle inversion NT  4  Ankle eversion NT  4   (Blank rows = not tested)   FUNCTIONAL TESTS:  Timed up and go (TUG): 19 sec   GAIT: Distance walked: 92f  Assistive device utilized: Single point cane Level of assistance: Complete [place] Comments: Use of SPC, walking on tippee toes on L LE.    TODAY'S TREATMENT: [facility] Adult PT Treatment:                                                DATE: 12/14/21 Therapeutic Exercise: Nustep L2 6 min Step ups L 4 in  15x runners step PF/DF against wall 15x Marching against wall 10/10 PF w/ball b/t heels 15x SLS L forefoot on floor R kickstand 30s x2 no UE support Manual Therapy: Subtalar DF and INV/EV mobs  Anterior L tibial translation mobs to increase DF  [facility]  [facility] Therapeutic Exercise: Seated toe raises 10x Seated LAQs 10x LTR 10x B Ankle alphabet while elevated Ankle pumps while elevated 10x Seated INV/EV/DF RTB 10x ea. Manual Therapy: Subtalar DF and INV/EV mobs   [facility] Adult PT Treatment:                                                DATE: 12/06/2021 Therapeutic Exercise: Supine TKE into ball L LE 5 sec hold 2x10  DF, IV, EV with RTB x15  Calf stretch on slant board 2 x 1 min Rocker board with focus on DF x 20  Seated toe raises x 20  LAQ 2x10  Lumbar rotations x 30 due to increased tension in L lumbar ms from holding hip up on L side with ambulation.  Gait 120f with SPC and inserts.  Manual Therapy: AP Talocrural joint mobs with OP into DF STM to medial/ lateral gastroc.   [facility]                                                [facility]: 12/04/2021 Therapeutic Exercise: Supine TKE into ball L LE 5 sec hold x10  SLR to tolerance x8 - painful in L hip Calf stretch on slant board 2 x 1 min Hamstring stretch- Pt has onset of pain prior to stretch  Lumbar rotations x 30 due to increased tension in L lumbar ms from holding hip up on L side with ambulation.  Manual Therapy: AP Talocrural joint mobs with OP into DF IASTYM to L gastroc  Self Care: Heel  lift, educated pt on wearing appropriate shoe to fit both feet to ensure heel lift stays under L heel.    PATIENT EDUCATION:  Education details: Educated pt on anatomy and physiology of current symptoms, LEFS questionnaire, diagnosis, prognosis, HEP,  and POC. Person educated: Patient Education method: Explanation, Demonstration, Tactile cues,  Verbal cues, and Handouts Education comprehension: verbalized understanding and returned demonstration   HOME EXERCISE PROGRAM: Access Code: [email or web address] URL: [email or web address] Date: 11/27/2021 Prepared by: SRudi Heap  Exercises - Supine Single Leg Ankle Pumps  - 2 x daily - 7 x weekly - 3 sets - 10 reps - Ankle Alphabet in Elevation  - 2 x daily - 7 x weekly - 2 sets - Seated Toe Raise  - 2 x daily - 7 x weekly - 2 sets - 10 reps - Seated Long Arc Quad  - 2 x daily - 7 x weekly - 2 sets - 10 reps - Long Sitting Ankle Eversion with Resistance  - 2 x daily - 7 x weekly - 2 sets - 10 reps - Long Sitting Ankle Dorsiflexion with Anchored Resistance  - 2 x daily - 7 x weekly - 2 sets - 10 reps - Long Sitting Ankle Inversion with Resistance  - 2 x daily - 7 x weekly - 2 sets - 10 reps   ASSESSMENT:   CLINICAL IMPRESSION: L hip pain main concern.  Advanced to aerobic work, CKC tasks and continued joint mobs.  Observed to attain neutral DF following manual interventions.  Reported less pain following session.     OBJECTIVE IMPAIRMENTS Abnormal gait, decreased activity tolerance, decreased balance, decreased endurance,  decreased knowledge of condition, decreased mobility, difficulty walking, decreased ROM, decreased strength, impaired flexibility, impaired sensation, impaired tone, and pain.    ACTIVITY LIMITATIONS carrying, lifting, bending, standing, squatting, stairs, transfers, dressing, and caring for others   PARTICIPATION LIMITATIONS: meal prep, cleaning, laundry, shopping, and community activity   PERSONAL FACTORS Education, Past/current experiences, Time since onset of injury/illness/exacerbation, and 1 comorbidity: Sickle cell anemia  are also affecting patient's functional outcome.    REHAB POTENTIAL: Good   CLINICAL DECISION MAKING: Stable/uncomplicated   EVALUATION COMPLEXITY: Low     GOALS: Goals reviewed with patient? No   SHORT TERM GOALS: Target date:  12/25/2021  Pt will be I and compliant with initial HEP. Baseline: Patient able to demo to PT w/o need of cuing Goal status: Met   3.  Pt will increase LEFS score to 28/80 indicating minimal detectable change Baseline: 12/11/21 17/80 Goal status: Ongoing   LONG TERM GOALS: Target date: 01/22/2022    Pt will be I and compliant with comprhensiveHEP. Baseline:  Goal status: ongoing as HEP is evolving   2.  Increase LEFS to 56/80 indicating minimal detectable change Baseline: 12/11/21 17/80  Goal status: ongoing per target date   3.  Pt will report <2/10 pain at baseline  Baseline:  Goal status: Ongoing as pain reported as moderate   4.  Pt will improve DF to neutral on L LE. Baseline: 12/11/21 -2d DF PROM noted at 0/90d knee position Goal status: Ongoing   5.  Pt will be able to ascend/ descend stairs with alternating gait patten.  Baseline: 12/11/21 Able to ascend with SPC and single rail alternating but descends with step to pattern Goal status: Ongoing   PLAN: PT FREQUENCY: 2x/week   PT DURATION: 8 weeks   PLANNED INTERVENTIONS: Therapeutic exercises, Therapeutic activity, Neuromuscular re-education, Balance training, Gait training, Patient/Family education, Joint manipulation, Joint mobilization, Stair training, Orthotic/Fit training, Aquatic Therapy, Dry Needling, Electrical stimulation, Cryotherapy, Moist heat, Manual lymph drainage, Compression bandaging, scar mobilization, Taping, Traction, Ultrasound, Manual therapy, and Re-evaluation   PLAN FOR NEXT SESSION: Assess HEP/update PRN, continue to progress functional mobility, strengthen L LE musculature. Decrease patients pain and help minimize functional deficits.     Jeffrey M Ziemba, PT 12/14/2021, 2:29 PM   Check all possible CPT codes: 97164 - PT Re-evaluation, 97110- Therapeutic Exercise, 97112- Neuro Re-education, 97116 - Gait Training, 97140 - Manual Therapy, 97530 - Therapeutic Activities, and 97535 - Self  Care     If treatment provided at initial evaluation, no treatment charged due to lack of authorization.      

## 2021-12-18 ENCOUNTER — Encounter: Payer: Self-pay | Admitting: Family Medicine

## 2021-12-18 ENCOUNTER — Ambulatory Visit: Payer: Medicaid Other | Admitting: Family Medicine

## 2021-12-18 ENCOUNTER — Ambulatory Visit: Payer: Medicaid Other | Attending: General Surgery

## 2021-12-18 VITALS — BP 129/76 | HR 72 | Temp 97.6°F | Ht 67.0 in | Wt 132.0 lb

## 2021-12-18 DIAGNOSIS — M25572 Pain in left ankle and joints of left foot: Secondary | ICD-10-CM | POA: Insufficient documentation

## 2021-12-18 DIAGNOSIS — G894 Chronic pain syndrome: Secondary | ICD-10-CM | POA: Diagnosis not present

## 2021-12-18 DIAGNOSIS — R2689 Other abnormalities of gait and mobility: Secondary | ICD-10-CM | POA: Diagnosis present

## 2021-12-18 DIAGNOSIS — M25672 Stiffness of left ankle, not elsewhere classified: Secondary | ICD-10-CM | POA: Insufficient documentation

## 2021-12-18 DIAGNOSIS — D571 Sickle-cell disease without crisis: Secondary | ICD-10-CM

## 2021-12-18 DIAGNOSIS — E559 Vitamin D deficiency, unspecified: Secondary | ICD-10-CM

## 2021-12-18 DIAGNOSIS — M6281 Muscle weakness (generalized): Secondary | ICD-10-CM | POA: Diagnosis present

## 2021-12-18 LAB — POCT URINALYSIS DIP (CLINITEK)
Bilirubin, UA: NEGATIVE
Glucose, UA: NEGATIVE mg/dL
Ketones, POC UA: NEGATIVE mg/dL
Leukocytes, UA: NEGATIVE
Nitrite, UA: NEGATIVE
POC PROTEIN,UA: 100 — AB
Spec Grav, UA: 1.015 (ref 1.010–1.025)
Urobilinogen, UA: 0.2 E.U./dL
pH, UA: 5.5 (ref 5.0–8.0)

## 2021-12-18 MED ORDER — OXBRYTA 500 MG PO TABS
1000.0000 mg | ORAL_TABLET | Freq: Every day | ORAL | 1 refills | Status: DC
  Filled 2021-12-18: qty 90, 45d supply, fill #0

## 2021-12-18 MED ORDER — OXBRYTA 500 MG PO TABS: 1000.0000 mg | ORAL_TABLET | Freq: Every day | ORAL | 1 refills | Status: DC

## 2021-12-18 NOTE — Therapy (Signed)
OUTPATIENT PHYSICAL THERAPY TREATMENT NOTE   Patient Name: [name] MRN: [id number] DOB:1970/08/16, 51 y.o., female Today's Date: 12/18/2021  PCP:  [name], FNP  REFERRING PROVIDER: Fredirick Maudlin, MD  END OF SESSION:   PT End of Session - 12/18/21 1219     Visit Number 6    Number of Visits 16    Date for PT Re-Evaluation 01/22/22    Authorization Type [place] - Visit Number 2    Authorization - Number of Visits 4    PT Start Time 1215    PT Stop Time 1300    PT Time Calculation (min) 45 min    Activity Tolerance Patient tolerated treatment well    Behavior During Therapy [facility] for tasks assessed/performed              Past Medical History:  Diagnosis Date   Anemia 03/30/2012   Hx of sickle cell disease   CAP (community acquired pneumonia)    2014   Cholelithiasis 03/30/2012   Chronic wound of extremity 04/01/2012   Elevated LFTs    Leg ulcer ([place]) 10/27/2012   Chronic under care of wound clinic   Multiple open wounds of lower extremity    chronic wounds B/LLE   Sickle cell disease ([name])    Sinus bradycardia by electrocardiogram 04/03/2012   Past Surgical History:  Procedure Laterality Date   ALLOGRAFT APPLICATION Right 01/16/5620   Procedure: SURGICAL PREP FOR GRAFTING RIGHT LOWER EXTREMITY AND APPLICATION OF Jannifer Hick;  Surgeon: Irene Limbo, MD;  Location: [place];  Service: Plastics;  Laterality: Right;   APPLICATION OF A-CELL OF EXTREMITY Right 10/09/2015   Procedure: APPLICATION OF Jannifer Hick;  Surgeon: Irene Limbo, MD;  Location: [place];  Service: Plastics;  Laterality: Right;   BREAST SURGERY     left breast cyst aspiration   CESAREAN SECTION     CESAREAN SECTION N/A 01/06/2014   Procedure: CESAREAN SECTION;  Surgeon: Emily Filbert, MD;  Location: [facility];  Service: Obstetrics;  Laterality: N/A;   CESAREAN SECTION N/A 05/04/2017   Procedure: CESAREAN SECTION;   Surgeon: Woodroe Mode, MD;  Location: [place];  Service: Obstetrics;  Laterality: N/A;   I & D EXTREMITY Right 10/09/2015   Procedure: SURGICAL PREPARATION FOR GRAFTING RIGHT ANKLE AND APPLICATION THERASKIN;  Surgeon: Irene Limbo, MD;  Location: [place];  Service: Plastics;  Laterality: Right;   SKIN FULL THICKNESS GRAFT Bilateral 06/17/2016   Procedure: SURGICAL PREP FOR GRAFTING, BILATERAL LOWER EXTREMITIES AND APPLICATION OF Jannifer Hick;  Surgeon: Irene Limbo, MD;  Location: [place];  Service: Plastics;  Laterality: Bilateral;   TONSILLECTOMY     Patient Active Problem List   Diagnosis Date Noted   Protein-calorie malnutrition, severe 04/06/2021   Bilateral leg ulcer ([place]) 03/28/2021   Low hemoglobin 05/01/2020   At risk for seizures due to oxygen toxicity 02/09/2020   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   PRECAUTIONS: None  SUBJECTIVE: 6/10 L ankle pain today  PAIN:  Are you having pain? Yes: NPRS scale: 2/10 Pain location: L ankle Pain description: Pulling sensation Aggravating factors: Walking, squatting, stairs Relieving factors: Sitting, laying down, pain medication.   OBJECTIVE:    DIAGNOSTIC FINDINGS: None   PATIENT SURVEYS:  LEFS 19/80; 12/11/21 17/80   COGNITION:           Overall cognitive status: Within functional limits for tasks assessed                          SENSATION: WFL   POSTURE: No Significant postural limitations   PALPATION: None.    LOWER EXTREMITY ROM:   Active ROM Right AROM eval Left AROM eval Left PROM  Hip flexion WFL Mod limitation    Hip extension        Hip abduction Min limitation Severe limitation    Hip adduction WFL Severe limitation    Hip internal rotation Mod limitation Severe limitation    Hip external rotation Min limitation Severe limitation    Knee flexion [facility] WFL    Knee extension [facility] WFL    Ankle dorsiflexion NT  -10 (lacking) -8  Ankle plantarflexion NT  50    Ankle inversion NT  18    Ankle eversion NT  20     (Blank rows = not tested)   LOWER EXTREMITY MMT:   MMT Right eval Left eval  Hip flexion 5 3+  Knee flexion NT  5  Knee extension NT  5  Ankle dorsiflexion NT  4+  Ankle plantarflexion NT  4+  Ankle inversion NT  4  Ankle eversion NT  4   (Blank rows = not tested)   FUNCTIONAL TESTS:  Timed up and go (TUG): 19 sec   GAIT: Distance walked: 75f  Assistive device utilized: Single point cane Level of assistance: Complete [place] Comments: Use of SPC, walking on tippee toes on L LE.    TODAY'S TREATMENT: [facility] Adult PT Treatment:                                                DATE: 12/18/21 Therapeutic Exercise: Nustep L2 8 min Stair negotiation 16 steps ascending step through w/cane and single rail, descending with step to pattern  single rail and cane  Step ups L 4 in with airex on top 15x R step ups onto 6 in block from airex to focus on L ankle stability 15x PF/DF against wall 15x ea. Marching against wall 15/15 FAQs w/adduction 15x2  [facility] Adult PT Treatment:  DATE: 12/14/21 Therapeutic Exercise: Nustep L2 6 min Step ups L 4 in 15x runners step PF/DF against wall 15x Marching against wall 10/10 PF w/ball b/t heels 15x SLS L forefoot on floor R kickstand 30s x2 no UE support Manual Therapy: Subtalar DF and INV/EV mobs  Anterior L tibial translation mobs to increase DF  [facility] Adult PT Treatment:                                                DATE: 12/11/21 Therapeutic Exercise: Seated toe raises 10x Seated LAQs 10x LTR 10x B Ankle alphabet while elevated Ankle pumps while elevated 10x Seated INV/EV/DF RTB 10x ea. Manual Therapy: Subtalar DF and INV/EV mobs    PATIENT EDUCATION:  Education details: Educated pt on anatomy and physiology of current symptoms, LEFS questionnaire, diagnosis, prognosis, HEP,  and POC. Person educated: Patient Education method: Explanation, Demonstration, Tactile cues, Verbal cues, and Handouts Education comprehension: verbalized understanding and returned demonstration   HOME EXERCISE PROGRAM: Access Code: [email or web address] URL: [email or web address] Date: 11/27/2021 Prepared by: Rudi Heap   Exercises - Supine Single Leg Ankle Pumps  - 2 x daily - 7 x weekly - 3 sets - 10 reps - Ankle Alphabet in Elevation  - 2 x daily - 7 x weekly - 2 sets - Seated Toe Raise  - 2 x daily - 7 x weekly - 2 sets - 10 reps - Seated Long Arc Quad  - 2 x daily - 7 x weekly - 2 sets - 10 reps - Long Sitting Ankle Eversion with Resistance  - 2 x daily - 7 x weekly - 2 sets - 10 reps - Long Sitting Ankle Dorsiflexion with Anchored Resistance  - 2 x daily - 7 x weekly - 2 sets - 10 reps - Long Sitting Ankle Inversion with Resistance  - 2 x  daily - 7 x weekly - 2 sets - 10 reps   ASSESSMENT:   CLINICAL IMPRESSION: Patient soft spoken and difficult to read.  Able to complete all functional tasks and activities w/o any c/o of increased symptoms but body language and compensatory movements suggest otherwise.  Today's session focused on functional tasks and skills as well asa balance and proprioceptive training to accommodate pain and structural changes in L ankle joint.     OBJECTIVE IMPAIRMENTS Abnormal gait, decreased activity tolerance, decreased balance, decreased endurance, decreased knowledge of condition, decreased mobility, difficulty walking, decreased ROM, decreased strength, impaired flexibility, impaired sensation, impaired tone, and pain.    ACTIVITY LIMITATIONS carrying, lifting, bending, standing, squatting, stairs, transfers, dressing, and caring for others   PARTICIPATION LIMITATIONS: meal prep, cleaning, laundry, shopping, and community activity   PERSONAL FACTORS Education, Past/current experiences, Time since onset of injury/illness/exacerbation, and 1 comorbidity: Sickle cell anemia  are also affecting patient's functional outcome.    REHAB POTENTIAL: Good   CLINICAL DECISION MAKING: Stable/uncomplicated   EVALUATION COMPLEXITY: Low     GOALS: Goals reviewed with patient? No   SHORT TERM GOALS: Target date: 12/25/2021  Pt will be I and compliant with initial HEP. Baseline: Patient able to demo to PT w/o need of cuing Goal status: Met   3.  Pt will increase LEFS score to 28/80 indicating minimal detectable change Baseline: 12/11/21 17/80 Goal status: Ongoing   LONG TERM GOALS: Target date: 01/22/2022    Pt will be I  and compliant with comprhensiveHEP. Baseline:  Goal status: ongoing as HEP is evolving   2.  Increase LEFS to 56/80 indicating minimal detectable change Baseline: 12/11/21 17/80  Goal status: ongoing per target date   3.  Pt will report <2/10 pain at baseline  Baseline:  Goal status:  Ongoing as pain reported as moderate   4.  Pt will improve DF to neutral on L LE. Baseline: 12/11/21 -2d DF PROM noted at 0/90d knee position Goal status: Ongoing   5.  Pt will be able to ascend/ descend stairs with alternating gait patten.  Baseline: 12/11/21 Able to ascend with [facility] and single rail alternating but descends with step to pattern Goal status: Ongoing   PLAN: PT FREQUENCY: 2x/week   PT DURATION: 8 weeks   PLANNED INTERVENTIONS: Therapeutic exercises, Therapeutic activity, Neuromuscular re-education, Balance training, Gait training, Patient/Family education, Joint manipulation, Joint mobilization, Stair training, Orthotic/Fit training, Aquatic Therapy, Dry Needling, Electrical stimulation, Cryotherapy, Moist heat, Manual lymph drainage, Compression bandaging, scar mobilization, Taping, Traction, Ultrasound, Manual therapy, and Re-evaluation   PLAN FOR NEXT SESSION: Assess HEP/update PRN, continue to progress functional mobility, strengthen L LE musculature. Decrease patients pain and help minimize functional deficits.     Lanice Shirts, PT 12/18/2021, 1:15 PM   Check all possible CPT codes: [phone] - PT Re-evaluation, 97110- Therapeutic Exercise, [phone]- Neuro Re-education, [phone] - Gait Training, [phone] - Manual Therapy, 97530 - Therapeutic Activities, and [phone] - Self Care     If treatment provided at initial evaluation, no treatment charged due to lack of authorization.

## 2021-12-18 NOTE — Progress Notes (Unsigned)
Patient [facility]    Subjective   Patient ID: [name], female    DOB: [birth year]  Age: 51 y.o. MRN: [id number]  Chief Complaint  Patient presents with   Follow-up    Pt is here for SCD follow up visit.  Pt stated she never got OXBRYTA from the pharmacy also is requesting a refill on Oxycodone    HPI [name] is a 51 year old female with a medical history significant for sickle cell disease, chronic pain syndrome, opiate dependence and tolerance, anemia of chronic disease, and bilateral lower extremity ulcers followed by wound care presents for 62-monthfollow-up of her chronic conditions.  Patient states that she has been doing well and her chronic pain is controlled on current medication regimen.  She is also requesting her oxycodone.  Patient continues to follow with wound care weekly for bilateral lower extremity ulcers.  Patient was previously prescribed Oxbryta, she never received medication.  Today, she denies shortness of breath, chest pain, dizziness, urinary symptoms, nausea, vomiting, or diarrhea. 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   Venous stasis 01/19/2020   Partial loss of skin graft 01/12/2020   Acute respiratory failure with hypoxia (HCC) 12/15/2019   Bone infarction of distal tibia, right ([place] 11/17/2019   Sickle cell anemia with crisis ([place] 09/03/2018   Other chronic pain    Sickle cell crisis ([place] 06/26/2018   Sickle cell pain crisis ([facility] 04/07/2018   Avascular necrosis of bone of hip, left ([place] 12/08/2017   Avascular necrosis of bone of hip, right ([facility] 12/08/2017   Hb-SS disease without crisis ([facility] 11/26/2017   Anemia of chronic disease 11/26/2017   Abnormal quad screen    Ringworm  of body 04/05/2017   Ulcer of right lower extremity ([place]    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[facility] 03/02/2017   History of pre-eclampsia 12/16/2016   Previous cesarean delivery x 2 07/22/2013   Hemochromatosis 11/10/2012   Chronic pain syndrome 009/62/8366  Complicated wound infection 04/01/2012   Leukocytosis 03/30/2012   Past Medical History:  Diagnosis Date   Anemia 03/30/2012   Hx of sickle cell disease   CAP (community acquired pneumonia)    2014   Cholelithiasis 03/30/2012   Chronic wound of extremity 04/01/2012   Elevated LFTs    Leg ulcer ([facility] [place] 10/27/2012   Chronic under care of wound clinic   Multiple open wounds of lower extremity    chronic wounds B/LLE   Sickle cell disease ([facility]    Sinus bradycardia by electrocardiogram 04/03/2012   Past Surgical History:  Procedure Laterality Date   ALLOGRAFT APPLICATION Right 92/94/7654  Procedure: SURGICAL PREP FOR GRAFTING RIGHT LOWER EXTREMITY AND APPLICATION OF TJannifer Hick  Surgeon: BIrene Limbo MD;  [place]  Service: Plastics;  Laterality: Right;   APPLICATION OF A-CELL OF EXTREMITY Right 10/09/2015   Procedure: APPLICATION OF TJannifer Hick  Surgeon: BIrene Limbo MD;  [place]  Service: Plastics;  Laterality: Right;   BREAST SURGERY     left breast cyst aspiration   CESAREAN SECTION     CESAREAN SECTION N/A 01/06/2014   Procedure: CESAREAN SECTION;  Surgeon: MEmily Filbert MD;  [place];  Service: Obstetrics;  Laterality: N/A;  CESAREAN SECTION N/A 05/04/2017   Procedure: CESAREAN SECTION;  Surgeon: Woodroe Mode, MD;  Location: [place];  Service: Obstetrics;  Laterality: N/A;   I & D EXTREMITY Right 10/09/2015   Procedure: SURGICAL PREPARATION FOR GRAFTING RIGHT ANKLE AND APPLICATION THERASKIN;  Surgeon: Irene Limbo, MD;  Location: [place];  Service: Plastics;  Laterality: Right;   SKIN FULL THICKNESS GRAFT  Bilateral 06/17/2016   Procedure: SURGICAL PREP FOR GRAFTING, BILATERAL LOWER EXTREMITIES AND APPLICATION OF Jannifer Hick;  Surgeon: Irene Limbo, MD;  Location: [place];  Service: Plastics;  Laterality: Bilateral;   TONSILLECTOMY     Social History   Tobacco Use   Smoking status: Never    Passive exposure: Never   Smokeless tobacco: Never  Vaping Use   Vaping Use: Never used  Substance Use Topics   Alcohol use: No   Drug use: No   Social History   Socioeconomic History   Marital status: Married    Spouse name: [name]   Number of children: 1   Years of education: Not on file   Highest education level: Not on file  Occupational History   Occupation: Employed in home care.  Tobacco Use   Smoking status: Never    Passive exposure: Never   Smokeless tobacco: Never  Vaping Use   Vaping Use: Never used  Substance and Sexual Activity   Alcohol use: No   Drug use: No   Sexual activity: Yes    Birth control/protection: None  Other Topics Concern   Not on file  Social History Narrative   Lives with husband.   Social Determinants of Health   Financial Resource Strain: Not on file  Food Insecurity: Not on file  Transportation Needs: Not on file  Physical Activity: Not on file  Stress: Not on file  Social Connections: Not on file  Intimate Partner Violence: Not on file   Family Status  Relation Name Status   Sister  (Not Specified)   Mother  Alive   Father  Deceased   Family History  Problem Relation Age of Onset   Sickle cell anemia Sister    Diabetes Mother    Asthma Mother    Sickle cell trait Mother    Sickle cell trait Father    Hypertension Father    No Known Allergies    Review of Systems  Constitutional: Negative.  Negative for chills and fever.  HENT: Negative.    Eyes: Negative.   Respiratory: Negative.    Cardiovascular: Negative.   Gastrointestinal: Negative.   Genitourinary: Negative.   Musculoskeletal:  Positive for back pain  and joint pain.  Neurological: Negative.   Psychiatric/Behavioral: Negative.        Objective:     BP 129/76 (BP Location: Left Arm, Patient Position: Sitting, Cuff Size: Small)   Pulse 72   Temp 97.6 F (36.4 C)   Ht '5\' 7"'$  (1.702 m)   Wt 132 lb (59.9 kg)   SpO2 90%   BMI 20.67 kg/m  BP Readings from Last 3 Encounters:  12/18/21 129/76  09/11/21 136/74  09/11/21 140/65   Wt Readings from Last 3 Encounters:  12/18/21 132 lb (59.9 kg)  09/11/21 134 lb 3.2 oz (60.9 kg)  08/16/21 132 lb (59.9 kg)      Physical Exam Constitutional:      Appearance: Normal appearance.  HENT:     Head: Normocephalic.     Nose: Nose normal.  Eyes:  Pupils: Pupils are equal, round, and reactive to light.  Cardiovascular:     Rate and Rhythm: Normal rate and regular rhythm.     Pulses: Normal pulses.     Heart sounds: Murmur heard.  Pulmonary:     Effort: Pulmonary effort is normal.     Breath sounds: Normal breath sounds.  Abdominal:     General: Bowel sounds are normal.  Musculoskeletal:        General: Normal range of motion.  Skin:    General: Skin is warm.     Comments: Bilateral lower extremity dressings in place, clean, dry, and intact.  Malodorous.  Neurological:     General: No focal deficit present.  Psychiatric:        Mood and Affect: Mood normal.        Behavior: Behavior normal.        Thought Content: Thought content normal.        Judgment: Judgment normal.      No results found for any visits on 12/18/21.  Last CBC Lab Results  Component Value Date   WBC 12.4 (H) 09/11/2021   HGB 8.3 (L) 09/11/2021   HCT 23.5 (L) 09/11/2021   MCV 87.7 09/11/2021   MCH 31.0 09/11/2021   RDW 24.9 (H) 09/11/2021   PLT 347 54/00/8676   Last metabolic panel Lab Results  Component Value Date   GLUCOSE 85 09/11/2021   NA 138 09/11/2021   K 3.6 09/11/2021   CL 107 09/11/2021   CO2 24 09/11/2021   BUN 7 09/11/2021   CREATININE 0.35 (L) 09/11/2021   GFRNONAA >60  09/11/2021   CALCIUM 9.2 09/11/2021   PROT 8.0 09/11/2021   ALBUMIN 4.3 09/11/2021   LABGLOB 2.9 08/16/2021   AGRATIO 1.6 08/16/2021   BILITOT 3.9 (H) 09/11/2021   ALKPHOS 51 09/11/2021   AST 44 (H) 09/11/2021   ALT 22 09/11/2021   ANIONGAP 7 09/11/2021   Last lipids Lab Results  Component Value Date   CHOL 143 11/25/2014   HDL 27 (L) 11/25/2014   LDLCALC 89 11/25/2014   TRIG 137 11/25/2014   CHOLHDL 5.3 11/25/2014   Last hemoglobin A1c Lab Results  Component Value Date   HGBA1C 4.4 (L) 03/20/2017   Last thyroid functions Lab Results  Component Value Date   TSH 0.099 (L) 03/20/2017   Last vitamin D Lab Results  Component Value Date   VD25OH 22.5 (L) 08/16/2021   Last vitamin B12 and Folate Lab Results  Component Value Date   FOLATE 9.5 03/02/2018      The ASCVD Risk score (Arnett DK, et al., 2019) failed to calculate for the following reasons:   Cannot find a previous HDL lab   Cannot find a previous total cholesterol lab    Assessment & Plan:   Problem List Items Addressed This Visit       Other   Chronic pain syndrome (Chronic)   Hb-SS disease without crisis ([place]) - Primary   Relevant Medications   voxelotor (OXBRYTA) 500 MG TABS tablet   Other Relevant Orders   Hgb Fractionation Cascade   Sickle Cell Panel   Other Visit Diagnoses     Vitamin D deficiency       Relevant Orders   Sickle Cell Panel     1. Hb-SS disease without crisis ([place])  - Hgb Fractionation Cascade - Sickle Cell Panel - voxelotor (OXBRYTA) 500 MG TABS tablet; Take 1,000 mg by mouth daily.  Dispense: 90 tablet; Refill:  1 - POCT URINALYSIS DIP (CLINITEK)  2. Chronic pain syndrome  - POCT URINALYSIS DIP (CLINITEK) - Oxycodone HCl 10 MG TABS; Take 1 tablet (10 mg total) by mouth every 4 (four) hours while awake.  Dispense: 90 tablet; Refill: 0  3. Vitamin D deficiency  - Sickle Cell Panel   Return in about 3 months (around 03/20/2022) for sickle cell anemia.      [name]  APRN, MSN, FNP-C [facility] [address] [place], [place] [address] [phone]

## 2021-12-18 NOTE — Therapy (Unsigned)
OUTPATIENT PHYSICAL THERAPY TREATMENT NOTE   Patient Name: [name] MRN: [id number] DOB:1970/10/06, 51 y.o., female Today's Date: 12/18/2021  PCP:  [name], FNP  REFERRING PROVIDER: Fredirick Maudlin, MD  END OF SESSION:      Past Medical History:  Diagnosis Date   Anemia 03/30/2012   Hx of sickle cell disease   CAP (community acquired pneumonia)    2014   Cholelithiasis 03/30/2012   Chronic wound of extremity 04/01/2012   Elevated LFTs    Leg ulcer ([place]) 10/27/2012   Chronic under care of wound clinic   Multiple open wounds of lower extremity    chronic wounds B/LLE   Sickle cell disease ([place])    Sinus bradycardia by electrocardiogram 04/03/2012   Past Surgical History:  Procedure Laterality Date   ALLOGRAFT APPLICATION Right 5/44/9201   Procedure: SURGICAL PREP FOR GRAFTING RIGHT LOWER EXTREMITY AND APPLICATION OF Jannifer Hick;  Surgeon: Irene Limbo, MD;  Location: [place];  Service: Plastics;  Laterality: Right;   APPLICATION OF A-CELL OF EXTREMITY Right 10/09/2015   Procedure: APPLICATION OF Jannifer Hick;  Surgeon: Irene Limbo, MD;  Location: [place];  Service: Plastics;  Laterality: Right;   BREAST SURGERY     left breast cyst aspiration   CESAREAN SECTION     CESAREAN SECTION N/A 01/06/2014   Procedure: CESAREAN SECTION;  Surgeon: Emily Filbert, MD;  Location: [facility];  Service: Obstetrics;  Laterality: N/A;   CESAREAN SECTION N/A 05/04/2017   Procedure: CESAREAN SECTION;  Surgeon: Woodroe Mode, MD;  Location: [place];  Service: Obstetrics;  Laterality: N/A;   I & D EXTREMITY Right 10/09/2015   Procedure: SURGICAL PREPARATION FOR GRAFTING RIGHT ANKLE AND APPLICATION THERASKIN;  Surgeon: Irene Limbo, MD;  Location: [place];  Service: Plastics;  Laterality: Right;   SKIN FULL THICKNESS GRAFT Bilateral 06/17/2016   Procedure: SURGICAL PREP FOR GRAFTING, BILATERAL LOWER EXTREMITIES AND  APPLICATION OF Jannifer Hick;  Surgeon: Irene Limbo, MD;  Location: [place];  Service: Plastics;  Laterality: Bilateral;   TONSILLECTOMY     Patient Active Problem List   Diagnosis Date Noted   Protein-calorie malnutrition, severe 04/06/2021   Bilateral leg ulcer ([place]) 03/28/2021   Low hemoglobin 05/01/2020   At risk for seizures due to oxygen toxicity 02/09/2020   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   Venous stasis 01/19/2020   Partial loss of skin graft 01/12/2020   Acute respiratory failure with hypoxia ([place]) 12/15/2019   Bone infarction of distal tibia, 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   Sickle cell anemia with crisis ([place]) 09/03/2018   Other chronic pain    Sickle cell crisis ([place]) 06/26/2018   Sickle cell pain crisis ([place]) 04/07/2018   Avascular necrosis of bone of hip, left ([place]) 12/08/2017   Avascular necrosis of bone of hip, right ([place]) 12/08/2017   Hb-SS disease without crisis ([place]) 11/26/2017   Anemia of chronic disease 11/26/2017   Abnormal quad screen    Ringworm of body 04/05/2017   Ulcer of right lower extremity ([place])    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[place]) 03/02/2017   History of pre-eclampsia 12/16/2016   Previous cesarean delivery x 2 07/22/2013   Hemochromatosis 11/10/2012   Chronic pain syndrome 00/71/2197   Complicated wound infection 04/01/2012   Leukocytosis 03/30/2012    REFERRING DIAG: Decreased L ankle ROM  THERAPY DIAG: Decreased L ankle ROM   Rationale for Evaluation and Treatment Rehabilitation  PERTINENT HISTORY: Sickle Cell disease.   PRECAUTIONS:  None  SUBJECTIVE: 6/10 L ankle pain today  PAIN:  Are you having pain? Yes: NPRS scale: 2/10 Pain location: L ankle Pain description: Pulling sensation Aggravating factors: Walking, squatting, stairs Relieving factors: Sitting, laying down, pain medication.   OBJECTIVE:    DIAGNOSTIC FINDINGS: None   PATIENT SURVEYS:  LEFS 19/80; 12/11/21 17/80   COGNITION:           Overall cognitive  status: Within functional limits for tasks assessed                          SENSATION: WFL   POSTURE: No Significant postural limitations   PALPATION: None.    LOWER EXTREMITY ROM:   Active ROM Right AROM eval Left AROM eval Left PROM  Hip flexion WFL Mod limitation    Hip extension        Hip abduction Min limitation Severe limitation    Hip adduction WFL Severe limitation    Hip internal rotation Mod limitation Severe limitation    Hip external rotation Min limitation Severe limitation    Knee flexion [facility] WFL    Knee extension [facility] WFL    Ankle dorsiflexion NT  -10 (lacking) -8  Ankle plantarflexion NT  50    Ankle inversion NT  18    Ankle eversion NT  20     (Blank rows = not tested)   LOWER EXTREMITY MMT:   MMT Right eval Left eval  Hip flexion 5 3+  Knee flexion NT  5  Knee extension NT  5  Ankle dorsiflexion NT  4+  Ankle plantarflexion NT  4+  Ankle inversion NT  4  Ankle eversion NT  4   (Blank rows = not tested)   FUNCTIONAL TESTS:  Timed up and go (TUG): 19 sec   GAIT: Distance walked: 75f  Assistive device utilized: Single point cane Level of assistance: Complete [place] Comments: Use of SPC, walking on tippee toes on L LE.    TODAY'S TREATMENT: [facility] Adult PT Treatment:                                                DATE: 12/18/21 Therapeutic Exercise: Nustep L2 8 min Stair negotiation 16 steps ascending step through w/cane and single rail, descending with step to pattern single rail and cane  Step ups L 4 in with airex on top 15x R step ups onto 6 in block from airex to focus on L ankle stability 15x PF/DF against wall 15x ea. Marching against wall 15/15 FAQs w/adduction 15x2  [facility] Adult PT Treatment:                                                DATE: 12/14/21 Therapeutic Exercise: Nustep L2 6 min Step ups L 4 in 15x runners step PF/DF against wall 15x Marching against wall 10/10 PF w/ball b/t heels 15x SLS L forefoot on floor R  kickstand 30s x2 no UE support Manual Therapy: Subtalar DF and INV/EV mobs  Anterior L tibial translation mobs to increase DF  [facility]  DATE: 12/11/21 Therapeutic Exercise: Seated toe raises 10x Seated LAQs 10x LTR 10x B Ankle alphabet while elevated Ankle pumps while elevated 10x Seated INV/EV/DF RTB 10x ea. Manual Therapy: Subtalar DF and INV/EV mobs    PATIENT EDUCATION:  Education details: Educated pt on anatomy and physiology of current symptoms, LEFS questionnaire, diagnosis, prognosis, HEP,  and POC. Person educated: Patient Education method: Explanation, Demonstration, Tactile cues, Verbal cues, and Handouts Education comprehension: verbalized understanding and returned demonstration   HOME EXERCISE PROGRAM: Access Code: [email or web address] URL: [email or web address] Date: 11/27/2021 Prepared by: Rudi Heap   Exercises - Supine Single Leg Ankle Pumps  - 2 x daily - 7 x weekly - 3 sets - 10 reps - Ankle Alphabet in Elevation  - 2 x daily - 7 x weekly - 2 sets - Seated Toe Raise  - 2 x daily - 7 x weekly - 2 sets - 10 reps - Seated Long Arc Quad  - 2 x daily - 7 x weekly - 2 sets - 10 reps - Long Sitting Ankle Eversion with Resistance  - 2 x daily - 7 x weekly - 2 sets - 10 reps - Long Sitting Ankle Dorsiflexion with Anchored Resistance  - 2 x daily - 7 x weekly - 2 sets - 10 reps - Long Sitting Ankle Inversion with Resistance  - 2 x daily - 7 x weekly - 2 sets - 10 reps   ASSESSMENT:   CLINICAL IMPRESSION: Patient soft spoken and difficult to read.  Able to complete all functional tasks and activities w/o any c/o of increased symptoms but body language and compensatory movements suggest otherwise.  Today's session focused on functional tasks and skills as well asa balance and proprioceptive training to accommodate pain and structural changes in L ankle joint.     OBJECTIVE IMPAIRMENTS Abnormal gait,  decreased activity tolerance, decreased balance, decreased endurance, decreased knowledge of condition, decreased mobility, difficulty walking, decreased ROM, decreased strength, impaired flexibility, impaired sensation, impaired tone, and pain.    ACTIVITY LIMITATIONS carrying, lifting, bending, standing, squatting, stairs, transfers, dressing, and caring for others   PARTICIPATION LIMITATIONS: meal prep, cleaning, laundry, shopping, and community activity   PERSONAL FACTORS Education, Past/current experiences, Time since onset of injury/illness/exacerbation, and 1 comorbidity: Sickle cell anemia  are also affecting patient's functional outcome.    REHAB POTENTIAL: Good   CLINICAL DECISION MAKING: Stable/uncomplicated   EVALUATION COMPLEXITY: Low     GOALS: Goals reviewed with patient? No   SHORT TERM GOALS: Target date: 12/25/2021  Pt will be I and compliant with initial HEP. Baseline: Patient able to demo to PT w/o need of cuing Goal status: Met   3.  Pt will increase LEFS score to 28/80 indicating minimal detectable change Baseline: 12/11/21 17/80 Goal status: Ongoing   LONG TERM GOALS: Target date: 01/22/2022    Pt will be I and compliant with comprhensiveHEP. Baseline:  Goal status: ongoing as HEP is evolving   2.  Increase LEFS to 56/80 indicating minimal detectable change Baseline: 12/11/21 17/80  Goal status: ongoing per target date   3.  Pt will report <2/10 pain at baseline  Baseline:  Goal status: Ongoing as pain reported as moderate   4.  Pt will improve DF to neutral on L LE. Baseline: 12/11/21 -2d DF PROM noted at 0/90d knee position Goal status: Ongoing   5.  Pt will be able to ascend/ descend stairs with alternating gait patten.  Baseline: 12/11/21 Able to ascend with [facility] and single  rail alternating but descends with step to pattern Goal status: Ongoing   PLAN: PT FREQUENCY: 2x/week   PT DURATION: 8 weeks   PLANNED INTERVENTIONS: Therapeutic exercises,  Therapeutic activity, Neuromuscular re-education, Balance training, Gait training, Patient/Family education, Joint manipulation, Joint mobilization, Stair training, Orthotic/Fit training, Aquatic Therapy, Dry Needling, Electrical stimulation, Cryotherapy, Moist heat, Manual lymph drainage, Compression bandaging, scar mobilization, Taping, Traction, Ultrasound, Manual therapy, and Re-evaluation   PLAN FOR NEXT SESSION: Assess HEP/update PRN, continue to progress functional mobility, strengthen L LE musculature. Decrease patients pain and help minimize functional deficits.     Lynden Ang, PT 12/18/2021, 4:31 PM   Check all possible CPT codes: [id number] - PT Re-evaluation, 97110- Therapeutic Exercise, [phone]- Neuro Re-education, [phone] - Gait Training, [phone] - Manual Therapy, 97530 - Therapeutic Activities, and [phone] - Self Care     If treatment provided at initial evaluation, no treatment charged due to lack of authorization.

## 2021-12-18 NOTE — Patient Instructions (Signed)
Sickle Cell Anemia, Adult  Sickle cell anemia is a condition where your red blood cells are shaped like sickles. Red blood cells carry oxygen through the body. Sickle-shaped cells do not live as long as normal red blood cells. They also clump together and block blood from flowing through the blood vessels. This prevents the body from getting enough oxygen. Sickle cell anemia causes organ damage and pain. It also increases the risk of infection. Follow these instructions at home: Medicines Take over-the-counter and prescription medicines only as told by your doctor. If you were prescribed an antibiotic medicine, take it as told by your doctor. Do not stop taking the antibiotic even if you start to feel better. If you develop a fever, do not take medicines to lower the fever right away. Tell your doctor about the fever. Managing pain, stiffness, and swelling Try these methods to help with pain: Use a heating pad. Take a warm bath. Distract yourself, such as by watching TV. Eating and drinking Drink enough fluid to keep your pee (urine) clear or pale yellow. Drink more in hot weather and during exercise. Limit or avoid alcohol. Eat a healthy diet. Eat plenty of fruits, vegetables, whole grains, and lean protein. Take vitamins and supplements as told by your doctor. Traveling When traveling, keep these with you: Your medical information. The names of your doctors. Your medicines. If you need to take an airplane, talk to your doctor first. Activity Rest often. Avoid exercises that make your heart beat much faster, such as jogging. General instructions Do not use products that have nicotine or tobacco, such as cigarettes and e-cigarettes. If you need help quitting, ask your doctor. Consider wearing a medical alert bracelet. Avoid being in high places (high altitudes), such as mountains. Avoid very hot or cold temperatures. Avoid places where the temperature changes a lot. Keep all  follow-up visits as told by your doctor. This is important. Contact a doctor if: A joint hurts. Your feet or hands hurt or swell. You feel tired (fatigued). Get help right away if: You have symptoms of infection. These include: Fever. Chills. Being very tired. Irritability. Poor eating. Throwing up (vomiting). You feel dizzy or faint. You have new stomach pain, especially on the left side. You have a an erection (priapism) that lasts more than 4 hours. You have numbness in your arms or legs. You have a hard time moving your arms or legs. You have trouble talking. You have pain that does not go away when you take medicine. You are short of breath. You are breathing fast. You have a long-term cough. You have pain in your chest. You have a bad headache. You have a stiff neck. Your stomach looks bloated even though you did not eat much. Your skin is pale. You suddenly cannot see well. Summary Sickle cell anemia is a condition where your red blood cells are shaped like sickles. Follow your doctor's advice on ways to manage pain, food to eat, activities to do, and steps to take for safe travel. Get medical help right away if you have any signs of infection, such as a fever. This information is not intended to replace advice given to you by your health care provider. Make sure you discuss any questions you have with your health care provider. Document Revised: 09/26/2019 Document Reviewed: 09/30/2019 Elsevier Patient Education  [place].

## 2021-12-20 ENCOUNTER — Other Ambulatory Visit (HOSPITAL_COMMUNITY): Payer: Self-pay

## 2021-12-20 ENCOUNTER — Telehealth: Payer: Self-pay | Admitting: Physical Therapy

## 2021-12-20 ENCOUNTER — Telehealth: Payer: Self-pay

## 2021-12-20 ENCOUNTER — Ambulatory Visit: Payer: Medicaid Other | Admitting: Physical Therapy

## 2021-12-20 MED ORDER — OXYCODONE HCL 10 MG PO TABS: 10.0000 mg | ORAL_TABLET | ORAL | 0 refills | Status: DC

## 2021-12-20 NOTE — Telephone Encounter (Signed)
Number provided on portal does not work. Tried to call pt about missed appt, and need to reschedule 8/8 due to PT being out.

## 2021-12-20 NOTE — Telephone Encounter (Signed)
Oxycodone  °

## 2021-12-21 ENCOUNTER — Other Ambulatory Visit (HOSPITAL_COMMUNITY): Payer: Self-pay

## 2021-12-21 LAB — CMP14+CBC/D/PLT+FER+RETIC+V...
ALT: 31 IU/L (ref 0–32)
AST: 70 IU/L — ABNORMAL HIGH (ref 0–40)
Albumin/Globulin Ratio: 1.6 (ref 1.2–2.2)
Albumin: 4.7 g/dL (ref 3.9–4.9)
Alkaline Phosphatase: 71 IU/L (ref 44–121)
BUN/Creatinine Ratio: 15 (ref 9–23)
BUN: 7 mg/dL (ref 6–24)
Basophils Absolute: 0.2 10*3/uL (ref 0.0–0.2)
Basos: 1 %
Bilirubin Total: 3.8 mg/dL — ABNORMAL HIGH (ref 0.0–1.2)
CO2: 20 mmol/L (ref 20–29)
Calcium: 9.9 mg/dL (ref 8.7–10.2)
Chloride: 104 mmol/L (ref 96–106)
Creatinine, Ser: 0.46 mg/dL — ABNORMAL LOW (ref 0.57–1.00)
EOS (ABSOLUTE): 0.6 10*3/uL — ABNORMAL HIGH (ref 0.0–0.4)
Eos: 5 %
Ferritin: 1079 ng/mL — ABNORMAL HIGH (ref 15–150)
Globulin, Total: 3 g/dL (ref 1.5–4.5)
Glucose: 81 mg/dL (ref 70–99)
Hematocrit: 18.6 % — ABNORMAL LOW (ref 34.0–46.6)
Hemoglobin: 6.4 g/dL — CL (ref 11.1–15.9)
Immature Grans (Abs): 0.2 10*3/uL — ABNORMAL HIGH (ref 0.0–0.1)
Immature Granulocytes: 1 %
Lymphocytes Absolute: 3.1 10*3/uL (ref 0.7–3.1)
Lymphs: 25 %
MCH: 31.5 pg (ref 26.6–33.0)
MCHC: 34.4 g/dL (ref 31.5–35.7)
MCV: 92 fL (ref 79–97)
Monocytes Absolute: 1.5 10*3/uL — ABNORMAL HIGH (ref 0.1–0.9)
Monocytes: 12 %
NRBC: 95 % — ABNORMAL HIGH (ref 0–0)
Neutrophils Absolute: 7.2 10*3/uL — ABNORMAL HIGH (ref 1.4–7.0)
Neutrophils: 56 %
Platelets: 324 10*3/uL (ref 150–450)
Potassium: 4.6 mmol/L (ref 3.5–5.2)
RBC: 2.03 x10E6/uL — CL (ref 3.77–5.28)
RDW: 27 % — ABNORMAL HIGH (ref 11.7–15.4)
Retic Ct Pct: 25.1 % — ABNORMAL HIGH (ref 0.6–2.6)
Sodium: 142 mmol/L (ref 134–144)
Total Protein: 7.7 g/dL (ref 6.0–8.5)
Vit D, 25-Hydroxy: 23 ng/mL — ABNORMAL LOW (ref 30.0–100.0)
WBC: 12.6 10*3/uL — ABNORMAL HIGH (ref 3.4–10.8)
eGFR: 117 mL/min/{1.73_m2} (ref >59)

## 2021-12-21 LAB — HGB FRACTIONATION CASCADE: Hgb A2: 3.3 % — ABNORMAL HIGH (ref 1.8–3.2)

## 2021-12-21 LAB — HGB FRAC BY HPLC+SOLUBILITY
Hgb A: 3.6 % — ABNORMAL LOW (ref 96.4–98.8)
Hgb C: 0 %
Hgb E: 0 %
Hgb F: 5 % — ABNORMAL HIGH (ref 0.0–2.0)
Hgb S: 88.1 % — ABNORMAL HIGH
Hgb Solubility: POSITIVE — AB
Hgb Variant: 0 %

## 2021-12-24 ENCOUNTER — Telehealth: Payer: Self-pay

## 2021-12-24 NOTE — Telephone Encounter (Addendum)
TC on 12/21/21 and 12/24/21 to request patient to reschedule 12/25/21 appointment as provider out of office.  Number rang busy over several attempts.

## 2021-12-25 ENCOUNTER — Ambulatory Visit: Payer: Medicaid Other

## 2021-12-26 ENCOUNTER — Encounter (HOSPITAL_BASED_OUTPATIENT_CLINIC_OR_DEPARTMENT_OTHER): Payer: Medicaid Other | Attending: General Surgery | Admitting: General Surgery

## 2021-12-26 DIAGNOSIS — D571 Sickle-cell disease without crisis: Secondary | ICD-10-CM | POA: Insufficient documentation

## 2021-12-26 DIAGNOSIS — L97818 Non-pressure chronic ulcer of other part of right lower leg with other specified severity: Secondary | ICD-10-CM | POA: Diagnosis present

## 2021-12-27 ENCOUNTER — Encounter: Payer: Medicaid Other | Admitting: Physical Therapy

## 2021-12-27 NOTE — Progress Notes (Signed)
[name] ([id number]) Visit Report for 12/26/2021 Arrival Information Details Patient Name: Date of Service: [name] [name] 12/26/2021 10:15 A M Medical Record Number: [id number] Patient Account Number: [id number] Date of Birth/Sex: Treating RN: [birth year] (51 y.o. [name] Primary Care Selby Slovacek: [name] Other Clinician: Referring Tyresse Jayson: Treating Denzal Meir/Extender: Elyse Jarvis Weeks in Treatment: 36 Visit Information History Since Last Visit Added or deleted any medications: No Patient Arrived: Ambulatory Any new allergies or adverse reactions: No Arrival Time: 10:05 Had a fall or experienced change in No Accompanied By: self activities of daily living that may affect Transfer Assistance: None risk of falls: Patient Identification Verified: Yes Signs or symptoms of abuse/neglect since last visito No Secondary Verification Process Completed: Yes Hospitalized since last visit: No Patient Requires Transmission-Based Precautions: No Implantable device outside of the clinic excluding No Patient Has Alerts: No cellular tissue based products placed in the center since last visit: Has Dressing in Place as Prescribed: Yes Has Compression in Place as Prescribed: Yes Pain Present Now: Yes Electronic Signature(s) Signed: 12/27/2021 4:41:51 PM By: Sharyn Creamer RN, BSN Entered By: Sharyn Creamer on 12/26/2021 10:07:33 -------------------------------------------------------------------------------- Encounter Discharge Information Details Patient Name: Date of Service: [name], [name] 12/26/2021 10:15 A M Medical Record Number: [id number] Patient Account Number: [id number] Date of Birth/Sex: Treating RN: 12-Sep-1970 (51 y.o. [name] Primary Care Laurana Magistro: [name] Other Clinician: Referring Danyla Wattley: Treating Jiovanna Frei/Extender: Elyse Jarvis Weeks in Treatment: 36 Encounter Discharge Information Items  Post Procedure Vitals Discharge Condition: Stable Temperature (F): 98.7 Ambulatory Status: Cane Pulse (bpm): 85 Discharge Destination: Home Respiratory Rate (breaths/min): 16 Transportation: Private Auto Blood Pressure (mmHg): 130/60 Accompanied By: self Schedule Follow-up Appointment: Yes Clinical Summary of Care: Patient Declined Electronic Signature(s) Signed: 12/27/2021 4:41:51 PM By: Sharyn Creamer RN, BSN Entered By: Sharyn Creamer on 12/26/2021 11:56:19 -------------------------------------------------------------------------------- Lower Extremity Assessment Details Patient Name: Date of Service: [name], [name] 12/26/2021 10:15 A M Medical Record Number: [id number] Patient Account Number: [id number] Date of Birth/Sex: Treating RN: Jun 23, 1970 (51 y.o. [name] Primary Care Cloy Cozzens: [name] Other Clinician: Referring Aleeyah Bensen: Treating Nimo Verastegui/Extender: Elyse Jarvis Weeks in Treatment: 36 Edema Assessment Assessed: [Left: No] [Right: No] Edema: [Left: Ye] [Right: s] Calf Left: Right: Point of Measurement: 33 cm From Medial Instep 28 cm 31.5 cm Ankle Left: Right: Point of Measurement: 10 cm From Medial Instep 17.8 cm 21 cm Vascular Assessment Pulses: Dorsalis Pedis Palpable: [Left:Yes] [Right:Yes] Electronic Signature(s) Signed: 12/27/2021 4:41:51 PM By: Sharyn Creamer RN, BSN Entered By: Sharyn Creamer on 12/26/2021 10:31:58 -------------------------------------------------------------------------------- Multi Wound Chart Details Patient Name: Date of Service: [name], [name] 12/26/2021 10:15 A M Medical Record Number: [id number] Patient Account Number: [id number] Date of Birth/Sex: Treating RN: September 08, 1970 (51 y.o. [name] Primary Care Stelios Kirby: [name] Other Clinician: Referring Zyair Russi: Treating Aijalon Kirtz/Extender: Elyse Jarvis Weeks in Treatment: 36 Vital Signs Height(in):  56 Pulse(bpm): 54 Weight(lbs): Blood Pressure(mmHg): 130/60 Body Mass Index(BMI): Temperature(F): 98.7 Respiratory Rate(breaths/min): 16 Photos: Right, Lateral Lower Leg Right, Medial Ankle Left, Medial Ankle Wound Location: Gradually Appeared Gradually Appeared Gradually Appeared Wounding Event: Sickle Cell Lesion Sickle Cell Lesion Lesion Primary Etiology: N/A Venous Leg Ulcer N/A Secondary Etiology: Anemia, Sickle Cell Disease, Anemia, Sickle Cell Disease, Anemia, Sickle Cell Disease, Comorbid History: Neuropathy Neuropathy Neuropathy 10/05/2012 06/26/2021 11/29/2021 Date Acquired: 36 26 3 Weeks of Treatment: Open Open Open Wound Status: No No No Wound Recurrence: Yes No No Clustered  Wound: 6.5x4.5x0.1 7x2.9x0.1 0.3x0.3x0.3 Measurements L x W x D (cm) 22.973 15.944 0.071 A (cm) : rea 2.297 1.594 0.021 Volume (cm) : 87.80% 45.00% -787.50% % Reduction in A rea: 87.80% 45.00% -2000.00% % Reduction in Volume: Full Thickness Without Exposed Full Thickness Without Exposed Full Thickness Without Exposed Classification: Support Structures Support Structures Support Structures Medium Medium Medium Exudate A mount: Purulent Purulent Serosanguineous Exudate Type: yellow, [name], green yellow, [name], green red, [name] Exudate Color: Flat and Intact Distinct, outline attached N/A Wound Margin: Small (1-33%) Small (1-33%) Large (67-100%) Granulation A mount: Pink, Pale Pink Pink Granulation Quality: Large (67-100%) Large (67-100%) Small (1-33%) Necrotic A mount: Fat Layer (Subcutaneous Tissue): Yes Fat Layer (Subcutaneous Tissue): Yes Fat Layer (Subcutaneous Tissue): Yes Exposed Structures: Fascia: No Fascia: No Fascia: No Tendon: No Tendon: No Tendon: No Muscle: No Muscle: No Muscle: No Joint: No Joint: No Joint: No Bone: No Bone: No Bone: No Large (67-100%) Medium (34-66%) Small (1-33%) Epithelialization: Debridement - Excisional Debridement -  Excisional N/A Debridement: Pre-procedure Verification/Time Out 10:41 10:41 N/A Taken: Lidocaine 5% topical ointment Lidocaine 5% topical ointment N/A Pain Control: Subcutaneous, Slough Necrotic/Eschar, Subcutaneous, N/A Tissue Debrided: Slough Skin/Subcutaneous Tissue Skin/Subcutaneous Tissue N/A Level: 29.25 20.3 N/A Debridement A (sq cm): rea Curette Curette N/A Instrument: Minimum Minimum N/A Bleeding: Pressure Pressure N/A Hemostasis Achieved: 3 1 N/A Procedural Pain: 0 0 N/A Post Procedural Pain: Procedure was tolerated well Procedure was tolerated well N/A Debridement Treatment Response: 6.5x4.5x0.1 7x2.9x0.1 N/A Post Debridement Measurements L x W x D (cm) 2.297 1.594 N/A Post Debridement Volume: (cm) Debridement Debridement N/A Procedures Performed: Wound Number: 23 N/A N/A Photos: N/A N/A Right, Distal, Lateral Ankle N/A N/A Wound Location: Gradually Appeared N/A N/A Wounding Event: T be determined o N/A N/A Primary Etiology: N/A N/A N/A Secondary Etiology: Anemia, Sickle Cell Disease, N/A N/A Comorbid History: Neuropathy 12/06/2021 N/A N/A Date Acquired: 2 N/A N/A Weeks of Treatment: Open N/A N/A Wound Status: No N/A N/A Wound Recurrence: No N/A N/A Clustered Wound: 3.8x1.2x0.2 N/A N/A Measurements L x W x D (cm) 3.581 N/A N/A A (cm) : rea 0.716 N/A N/A Volume (cm) : -660.30% N/A N/A % Reduction in Area: -661.70% N/A N/A % Reduction in Volume: Full Thickness Without Exposed N/A N/A Classification: Support Structures Medium N/A N/A Exudate Amount: Serosanguineous N/A N/A Exudate Type: red, [name] N/A N/A Exudate Color: N/A N/A N/A Wound Margin: Large (67-100%) N/A N/A Granulation Amount: Pink N/A N/A Granulation Quality: Small (1-33%) N/A N/A Necrotic Amount: Fat Layer (Subcutaneous Tissue): Yes N/A N/A Exposed Structures: Fascia: No Tendon: No Muscle: No Joint: No Bone: No None N/A N/A Epithelialization: N/A N/A  N/A Debridement: N/A N/A N/A Pain Control: N/A N/A N/A Tissue Debrided: N/A N/A N/A Level: N/A N/A N/A Debridement A (sq cm): rea N/A N/A N/A Instrument: N/A N/A N/A Bleeding: N/A N/A N/A Hemostasis A chieved: N/A N/A N/A Procedural Pain: N/A N/A N/A Post Procedural Pain: Debridement Treatment Response: N/A N/A N/A Post Debridement Measurements L x N/A N/A N/A W x D (cm) N/A N/A N/A Post Debridement Volume: (cm) N/A N/A N/A Procedures Performed: Treatment Notes Electronic Signature(s) Signed: 12/26/2021 11:11:22 AM By: Fredirick Maudlin MD FACS Signed: 12/27/2021 5:22:54 PM By: Dellie Catholic RN Entered By: Fredirick Maudlin on 12/26/2021 11:11:22 -------------------------------------------------------------------------------- Multi-Disciplinary Care Plan Details Patient Name: Date of Service: [name], [name] 12/26/2021 10:15 A M Medical Record Number: [id number] Patient Account Number: [id number] Date of Birth/Sex: Treating RN: Mar 01, 1971 (51 y.o. Ardelle Balls, Morey Hummingbird  Primary Care Omarian Jaquith: [name] Other Clinician: Referring Makenah Karas: Treating Conley Pawling/Extender: Elyse Jarvis Weeks in Treatment: 36 Multidisciplinary Care Plan reviewed with physician Active Inactive Venous Leg Ulcer Nursing Diagnoses: Actual venous Insuffiency (use after diagnosis is confirmed) Knowledge deficit related to disease process and management Goals: Patient will maintain optimal edema control Date Initiated: 06/26/2021 Target Resolution Date: 01/16/2022 Goal Status: Active Interventions: Assess peripheral edema status every visit. Compression as ordered Treatment Activities: Therapeutic compression applied : 06/26/2021 Notes: Wound/Skin Impairment Nursing Diagnoses: Impaired tissue integrity Goals: Patient/caregiver will verbalize understanding of skin care regimen Date Initiated: 04/17/2021 Target Resolution Date: 01/16/2022 Goal Status:  Active Ulcer/skin breakdown will have a volume reduction of 30% by week 4 Date Initiated: 04/17/2021 Date Inactivated: 05/29/2021 Target Resolution Date: 05/15/2021 Goal Status: Met Ulcer/skin breakdown will have a volume reduction of 50% by week 8 Date Initiated: 05/29/2021 Date Inactivated: 06/26/2021 Target Resolution Date: 06/26/2021 Goal Status: Unmet Unmet Reason: venous reflux Interventions: Assess patient/caregiver ability to obtain necessary supplies Assess patient/caregiver ability to perform ulcer/skin care regimen upon admission and as needed Assess ulceration(s) every visit Provide education on ulcer and skin care Treatment Activities: Topical wound management initiated : 04/17/2021 Notes: 06/08/21: Left leg wounds greater than 30% volume reduction, right leg acute infection. Electronic Signature(s) Signed: 12/27/2021 4:41:51 PM By: Sharyn Creamer RN, BSN Entered By: Sharyn Creamer on 12/26/2021 10:35:55 -------------------------------------------------------------------------------- Pain Assessment Details Patient Name: Date of Service: [name], [name] 12/26/2021 10:15 A M Medical Record Number: [id number] Patient Account Number: [id number] Date of Birth/Sex: Treating RN: 05/13/1971 (51 y.o. [name] Primary Care Giovany Cosby: [name] Other Clinician: Referring Alta Shober: Treating Lakyia Behe/Extender: Elyse Jarvis Weeks in Treatment: 36 Active Problems Location of Pain Severity and Description of Pain Patient Has Paino Yes Site Locations Rate the pain. Current Pain Level: 7 Pain Management and Medication Current Pain Management: Electronic Signature(s) Signed: 12/27/2021 4:41:51 PM By: Sharyn Creamer RN, BSN Entered By: Sharyn Creamer on 12/26/2021 10:31:00 -------------------------------------------------------------------------------- Patient/Caregiver Education Details Patient Name: Date of Service: [name], [name]  8/9/2023andnbsp10:15 [place] Record Number: [id number] Patient Account Number: [id number] Date of Birth/Gender: Treating RN: 07-03-1970 (51 y.o. [name] Primary Care Physician: [name] Other Clinician: Referring Physician: Treating Physician/Extender: Elyse Jarvis Weeks in Treatment: 66 Education Assessment Education Provided To: Patient Education Topics Provided Wound/Skin Impairment: Methods: Explain/Verbal Responses: State content correctly Electronic Signature(s) Signed: 12/27/2021 4:41:51 PM By: Sharyn Creamer RN, BSN Entered By: Sharyn Creamer on 12/26/2021 10:36:17 -------------------------------------------------------------------------------- Wound Assessment Details Patient Name: Date of Service: [name], [name] 12/26/2021 10:15 A M Medical Record Number: [id number] Patient Account Number: [id number] Date of Birth/Sex: Treating RN: 1970-09-09 (51 y.o. [name] Primary Care Victoria Henshaw: [name] Other Clinician: Referring Hartwell Vandiver: Treating Leasa Kincannon/Extender: Elyse Jarvis Weeks in Treatment: 36 Wound Status Wound Number: 17 Primary Etiology: Sickle Cell Lesion Wound Location: Right, Lateral Lower Leg Wound Status: Open Wounding Event: Gradually Appeared Comorbid History: Anemia, Sickle Cell Disease, Neuropathy Date Acquired: 10/05/2012 Weeks Of Treatment: 36 Clustered Wound: Yes Photos Wound Measurements Length: (cm) 6.5 Width: (cm) 4.5 Depth: (cm) 0.1 Area: (cm) 22.973 Volume: (cm) 2.297 % Reduction in Area: 87.8% % Reduction in Volume: 87.8% Epithelialization: Large (67-100%) Tunneling: No Undermining: No Wound Description Classification: Full Thickness Without Exposed Support Structures Wound Margin: Flat and Intact Exudate Amount: Medium Exudate Type: Purulent Exudate Color: yellow, [name], green Foul Odor After Cleansing: No Slough/Fibrino Yes Wound Bed Granulation  Amount: Small (  1-33%) Exposed Structure Granulation Quality: Pink, Pale Fascia Exposed: No Necrotic Amount: Large (67-100%) Fat Layer (Subcutaneous Tissue) Exposed: Yes Necrotic Quality: Adherent Slough Tendon Exposed: No Muscle Exposed: No Joint Exposed: No Bone Exposed: No Treatment Notes Wound #17 (Lower Leg) Wound Laterality: Right, Lateral Cleanser Soap and Water Discharge Instruction: May shower and wash wound with dial antibacterial soap and water prior to dressing change. Wound Cleanser Discharge Instruction: Cleanse the wound with wound cleanser prior to applying a clean dressing using gauze sponges, not tissue or cotton balls. Peri-Wound Care Triamcinolone 15 (g) Discharge Instruction: Use triamcinolone 15 (g) as directed Sween Lotion (Moisturizing lotion) Discharge Instruction: Apply moisturizing lotion as directed Topical Mupirocin Ointment Discharge Instruction: Apply Mupirocin (Bactroban) as instructed Primary Dressing PROGENA MATRIX Secondary Dressing ABD Pad, 5x9 Discharge Instruction: Apply over primary dressing as directed. Zetuvit Plus 4x8 in Discharge Instruction: Apply over primary dressing as directed. Secured With Compression Wrap Kerlix Roll 4.5x3.1 (in/yd) Discharge Instruction: Apply Kerlix and Coban compression as directed. Coban Self-Adherent Wrap 4x5 (in/yd) Discharge Instruction: Apply over Kerlix as directed. Compression Stockings Add-Ons Electronic Signature(s) Signed: 12/27/2021 4:41:51 PM By: Sharyn Creamer RN, BSN Entered By: Sharyn Creamer on 12/26/2021 13:24:40 -------------------------------------------------------------------------------- Wound Assessment Details Patient Name: Date of Service: [name], [name] 12/26/2021 10:15 A M Medical Record Number: [id number] Patient Account Number: [id number] Date of Birth/Sex: Treating RN: 04-12-1971 (51 y.o. [name] Primary Care Christmas Faraci: [name] Other  Clinician: Referring Kishon Garriga: Treating Nhan Qualley/Extender: Elyse Jarvis Weeks in Treatment: 36 Wound Status Wound Number: 21 Primary Etiology: Sickle Cell Lesion Wound Location: Right, Medial Ankle Secondary Etiology: Venous Leg Ulcer Wounding Event: Gradually Appeared Wound Status: Open Date Acquired: 06/26/2021 Comorbid History: Anemia, Sickle Cell Disease, Neuropathy Weeks Of Treatment: 26 Clustered Wound: No Photos Wound Measurements Length: (cm) 7 Width: (cm) 2.9 Depth: (cm) 0.1 Area: (cm) 15.944 Volume: (cm) 1.594 % Reduction in Area: 45% % Reduction in Volume: 45% Epithelialization: Medium (34-66%) Tunneling: No Undermining: No Wound Description Classification: Full Thickness Without Exposed Support Structures Wound Margin: Distinct, outline attached Exudate Amount: Medium Exudate Type: Purulent Exudate Color: yellow, [name], green Foul Odor After Cleansing: No Slough/Fibrino Yes Wound Bed Granulation Amount: Small (1-33%) Exposed Structure Granulation Quality: Pink Fascia Exposed: No Necrotic Amount: Large (67-100%) Fat Layer (Subcutaneous Tissue) Exposed: Yes Necrotic Quality: Adherent Slough Tendon Exposed: No Muscle Exposed: No Joint Exposed: No Bone Exposed: No Treatment Notes Wound #21 (Ankle) Wound Laterality: Right, Medial Cleanser Soap and Water Discharge Instruction: May shower and wash wound with dial antibacterial soap and water prior to dressing change. Wound Cleanser Discharge Instruction: Cleanse the wound with wound cleanser prior to applying a clean dressing using gauze sponges, not tissue or cotton balls. Peri-Wound Care Triamcinolone 15 (g) Discharge Instruction: Use triamcinolone 15 (g) as directed Sween Lotion (Moisturizing lotion) Discharge Instruction: Apply moisturizing lotion as directed Topical Mupirocin Ointment Discharge Instruction: Apply Mupirocin (Bactroban) as instructed Primary Dressing PROGENA  MATRIX Secondary Dressing ABD Pad, 5x9 Discharge Instruction: Apply over primary dressing as directed. Zetuvit Plus 4x8 in Discharge Instruction: Apply over primary dressing as directed. Secured With Compression Wrap Kerlix Roll 4.5x3.1 (in/yd) Discharge Instruction: Apply Kerlix and Coban compression as directed. Coban Self-Adherent Wrap 4x5 (in/yd) Discharge Instruction: Apply over Kerlix as directed. Compression Stockings Add-Ons Electronic Signature(s) Signed: 12/27/2021 4:41:51 PM By: Sharyn Creamer RN, BSN Entered By: Sharyn Creamer on 12/26/2021 10:29:19 -------------------------------------------------------------------------------- Wound Assessment Details Patient Name: Date of Service: [name], [name] 12/26/2021 10:15 A M Medical Record  Number: [id number] Patient Account Number: [id number] Date of Birth/Sex: Treating RN: 10-Mar-1971 (51 y.o. [name] Primary Care Chistina Roston: [name] Other Clinician: Referring Eashan Schipani: Treating Nic Lampe/Extender: Elyse Jarvis Weeks in Treatment: 36 Wound Status Wound Number: 22 Primary Etiology: Lesion Wound Location: Left, Medial Ankle Wound Status: Open Wounding Event: Gradually Appeared Comorbid History: Anemia, Sickle Cell Disease, Neuropathy Date Acquired: 11/29/2021 Weeks Of Treatment: 3 Clustered Wound: No Photos Wound Measurements Length: (cm) 0.3 Width: (cm) 0.3 Depth: (cm) 0.3 Area: (cm) 0.071 Volume: (cm) 0.021 % Reduction in Area: -787.5% % Reduction in Volume: -2000% Epithelialization: Small (1-33%) Tunneling: No Undermining: No Wound Description Classification: Full Thickness Without Exposed Support Structures Exudate Amount: Medium Exudate Type: Serosanguineous Exudate Color: red, [name] Foul Odor After Cleansing: No Slough/Fibrino Yes Wound Bed Granulation Amount: Large (67-100%) Exposed Structure Granulation Quality: Pink Fascia Exposed: No Necrotic Amount:  Small (1-33%) Fat Layer (Subcutaneous Tissue) Exposed: Yes Necrotic Quality: Adherent Slough Tendon Exposed: No Muscle Exposed: No Joint Exposed: No Bone Exposed: No Treatment Notes Wound #22 (Ankle) Wound Laterality: Left, Medial Cleanser Soap and Water Discharge Instruction: May shower and wash wound with dial antibacterial soap and water prior to dressing change. Peri-Wound Care Topical Mupirocin Ointment Discharge Instruction: Apply Mupirocin (Bactroban) as instructed Primary Dressing KerraCel Ag Gelling Fiber Dressing, 2x2 in (silver alginate) Discharge Instruction: Apply silver alginate to wound bed as instructed Secondary Dressing Bordered Gauze, 2x2 in Discharge Instruction: Apply over primary dressing as directed. Secured With Compression Wrap Compression Stockings Environmental education officer) Signed: 12/27/2021 4:41:51 PM By: Sharyn Creamer RN, BSN Entered By: Sharyn Creamer on 12/26/2021 10:25:27 -------------------------------------------------------------------------------- Wound Assessment Details Patient Name: Date of Service: [name], [name] 12/26/2021 10:15 A M Medical Record Number: [id number] Patient Account Number: [id number] Date of Birth/Sex: Treating RN: Mar 11, 1971 (51 y.o. [name] Primary Care Maniyah Moller: [name] Other Clinician: Referring Stacey Sago: Treating Elbie Statzer/Extender: Elyse Jarvis Weeks in Treatment: 36 Wound Status Wound Number: 23 Primary Etiology: T be determined o Wound Location: Right, Distal, Lateral Ankle Wound Status: Open Wounding Event: Gradually Appeared Comorbid History: Anemia, Sickle Cell Disease, Neuropathy Date Acquired: 12/06/2021 Weeks Of Treatment: 2 Clustered Wound: No Photos Wound Measurements Length: (cm) 3.8 Width: (cm) 1.2 Depth: (cm) 0.2 Area: (cm) 3.581 Volume: (cm) 0.716 % Reduction in Area: -660.3% % Reduction in Volume: -661.7% Epithelialization:  None Tunneling: No Undermining: No Wound Description Classification: Full Thickness Without Exposed Support Structures Exudate Amount: Medium Exudate Type: Serosanguineous Exudate Color: red, [name] Foul Odor After Cleansing: No Slough/Fibrino Yes Wound Bed Granulation Amount: Large (67-100%) Exposed Structure Granulation Quality: Pink Fascia Exposed: No Necrotic Amount: Small (1-33%) Fat Layer (Subcutaneous Tissue) Exposed: Yes Necrotic Quality: Adherent Slough Tendon Exposed: No Muscle Exposed: No Joint Exposed: No Bone Exposed: No Treatment Notes Wound #23 (Ankle) Wound Laterality: Right, Lateral, Distal Cleanser Peri-Wound Care Topical Primary Dressing Secondary Dressing Secured With Compression Wrap Compression Stockings Add-Ons Electronic Signature(s) Signed: 12/27/2021 4:41:51 PM By: Sharyn Creamer RN, BSN Entered By: Sharyn Creamer on 12/26/2021 10:30:14 -------------------------------------------------------------------------------- Vitals Details Patient Name: Date of Service: [name], [name] 12/26/2021 10:15 A M Medical Record Number: [id number] Patient Account Number: [id number] Date of Birth/Sex: Treating RN: 1971-05-04 (51 y.o. [name] Primary Care Lakeshia Dohner: Other Clinician: [name] Referring Zuma Hust: Treating Demetress Tift/Extender: Elyse Jarvis Weeks in Treatment: 36 Vital Signs Time Taken: 10:09 Temperature (F): 98.7 Height (in): 67 Pulse (bpm): 85 Respiratory Rate (breaths/min): 16 Blood Pressure (mmHg): 130/60 Reference Range: 80 - 120  mg / dl Electronic Signature(s) Signed: 12/27/2021 4:41:51 PM By: Sharyn Creamer RN, BSN Entered By: Sharyn Creamer on 12/26/2021 10:09:49

## 2021-12-27 NOTE — Therapy (Signed)
OUTPATIENT PHYSICAL THERAPY TREATMENT NOTE   Patient Name: [name] MRN: [id number] DOB:1971/02/19, 51 y.o., female Today's Date: 12/31/2021  PCP:  [name], FNP  REFERRING PROVIDER: Fredirick Maudlin, MD  END OF SESSION:   PT End of Session - 12/31/21 1543     Visit Number 7    Number of Visits 16    Date for PT Re-Evaluation 01/22/22    Authorization Type  [place] - Visit Number 3    Authorization - Number of Visits 4    PT Start Time 2023    PT Stop Time 1632    PT Time Calculation (min) 47 min    Activity Tolerance Patient limited by pain    Behavior During Therapy [facility] for tasks assessed/performed               Past Medical History:  Diagnosis Date   Anemia 03/30/2012   Hx of sickle cell disease   CAP (community acquired pneumonia)    2014   Cholelithiasis 03/30/2012   Chronic wound of extremity 04/01/2012   Elevated LFTs    Leg ulcer ([place]) 10/27/2012   Chronic under care of wound clinic   Multiple open wounds of lower extremity    chronic wounds B/LLE   Sickle cell disease ([place])    Sinus bradycardia by electrocardiogram 04/03/2012   Past Surgical History:  Procedure Laterality Date   ALLOGRAFT APPLICATION Right 3/43/5686   Procedure: SURGICAL PREP FOR GRAFTING RIGHT LOWER EXTREMITY AND APPLICATION OF Jannifer Hick;  Surgeon: Irene Limbo, MD;  Location: [place];  Service: Plastics;  Laterality: Right;   APPLICATION OF A-CELL OF EXTREMITY Right 10/09/2015   Procedure: APPLICATION OF Jannifer Hick;  Surgeon: Irene Limbo, MD;  Location: [place];  Service: Plastics;  Laterality: Right;   BREAST SURGERY     left breast cyst aspiration   CESAREAN SECTION     CESAREAN SECTION N/A 01/06/2014   Procedure: CESAREAN SECTION;  Surgeon: Emily Filbert, MD;  Location: [facility];  Service: Obstetrics;  Laterality: N/A;   CESAREAN SECTION N/A 05/04/2017   Procedure: CESAREAN SECTION;   Surgeon: Woodroe Mode, MD;  Location: [facility];  Service: Obstetrics;  Laterality: N/A;   I & D EXTREMITY Right 10/09/2015   Procedure: SURGICAL PREPARATION FOR GRAFTING RIGHT ANKLE AND APPLICATION THERASKIN;  Surgeon: Irene Limbo, MD;  Location: [place];  Service: Plastics;  Laterality: Right;   SKIN FULL THICKNESS GRAFT Bilateral 06/17/2016   Procedure: SURGICAL PREP FOR GRAFTING, BILATERAL LOWER EXTREMITIES AND APPLICATION OF Jannifer Hick;  Surgeon: Irene Limbo, MD;  Location: [place];  Service: Plastics;  Laterality: Bilateral;   TONSILLECTOMY     Patient Active Problem List   Diagnosis Date Noted   Protein-calorie malnutrition, severe 04/06/2021   Bilateral leg ulcer ([place]) 03/28/2021   Low hemoglobin 05/01/2020   At risk for seizures due to oxygen toxicity 02/09/2020   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   Venous stasis 01/19/2020   Partial loss of skin graft 01/12/2020   Acute respiratory failure with hypoxia ([facility]) 12/15/2019   Bone infarction of distal tibia, 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   Sickle cell anemia with crisis ([place]) 09/03/2018   Other chronic pain    Sickle cell crisis ([place]) 06/26/2018   Sickle cell pain crisis ([place]) 04/07/2018   Avascular necrosis of bone of hip, left ([place]) 12/08/2017   Avascular necrosis of bone of hip, right ([place]) 12/08/2017   Hb-SS disease  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 ([place]) 11/26/2017   Anemia of chronic disease 11/26/2017   Abnormal quad screen    Ringworm of body 04/05/2017   Ulcer of right lower extremity ([place])    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   PRECAUTIONS: None  SUBJECTIVE: 4/10 L ankle pain today  PAIN:  Are you having pain? Yes: NPRS scale: 2/10 Pain location: L ankle Pain description: Pulling sensation Aggravating factors: Walking, squatting, stairs Relieving factors: Sitting, laying down, pain medication.   OBJECTIVE:    DIAGNOSTIC FINDINGS: None   PATIENT SURVEYS:  LEFS 19/80; 12/11/21 17/80   COGNITION:           Overall cognitive status: Within functional limits for tasks assessed                          SENSATION: WFL   POSTURE: No Significant postural limitations   PALPATION: None.    LOWER EXTREMITY ROM:   Active ROM Right AROM eval Left AROM eval Left PROM Left PROM 12/31/2021   Hip flexion WFL Mod limitation   Mod limitation  Hip extension         Hip abduction Min limitation Severe limitation   Severe limitation  Hip adduction WFL Severe limitation   Severe limitation  Hip internal rotation Mod limitation Severe limitation   Severe limitation  Hip external rotation Min limitation Severe limitation   Severe limitation  Knee flexion [facility]   WFL  Knee extension [facility]   WFL  Ankle dorsiflexion NT  -10 (lacking) -8 -7  Ankle plantarflexion NT  50   52  Ankle inversion NT  18   25  Ankle eversion NT  20   19   (Blank rows = not tested)   LOWER EXTREMITY MMT:   MMT Right eval Left eval Left 12/31/2021  Hip flexion 5 3+ 3+  Knee flexion NT  5 5  Knee extension NT  5 5  Ankle dorsiflexion NT  4+ 4+  Ankle plantarflexion NT  4+ 4+  Ankle inversion NT  4 4  Ankle eversion NT  4 4   (Blank rows = not tested)   FUNCTIONAL TESTS:  Timed up and go (TUG): 19 sec TUG: 18.97 sec   GAIT: Distance walked: 45f  Assistive device utilized: Single point cane Level of assistance: Complete [place] Comments: Use of SPC, walking on tippee toes on L LE.    TODAY'S TREATMENT: [facility] Adult PT Treatment:                                                 DATE: 12/31/21 Therapeutic Exercise: Nustep L2 8 min Stair negotiation 16 steps ascending step through w/cane and single rail, descending with step to pattern single rail and cane  Step ups L 4 in with airex on top 15x R step ups onto 6 in block from airex to focus on L ankle stability 15x PF/DF against wall  15x ea. Marching against wall 15/15 LAQs w/adduction 15x2 Wall squats x15  [facility] Adult PT Treatment:                                                DATE: 12/18/21 Therapeutic Exercise: Nustep L2 8 min Stair negotiation 16 steps ascending step through w/cane and single rail, descending with step to pattern single rail and cane  Step ups L 4 in with airex on top 15x R step ups onto 6 in block from airex to focus on L ankle stability 15x PF/DF against wall 15x ea. Marching against wall 15/15 FAQs w/adduction 15x2  [facility] Adult PT Treatment:                                                DATE: 12/14/21 Therapeutic Exercise: Nustep L2 6 min Step ups L 4 in 15x runners step PF/DF against wall 15x Marching against wall 10/10 PF w/ball b/t heels 15x SLS L forefoot on floor R kickstand 30s x2 no UE support Manual Therapy: Subtalar DF and INV/EV mobs  Anterior L tibial translation mobs to increase DF   PATIENT EDUCATION:  Education details: Educated pt on anatomy and physiology of current symptoms, LEFS questionnaire, diagnosis, prognosis, HEP,  and POC. Person educated: Patient Education method: Explanation, Demonstration, Tactile cues, Verbal cues, and Handouts Education comprehension: verbalized understanding and returned demonstration   HOME EXERCISE PROGRAM: Access Code: [email or web address] URL: [email or web address] Date: 11/27/2021 Prepared by: Rudi Heap   Exercises - Supine Single Leg Ankle Pumps  - 2 x daily - 7 x weekly - 3 sets - 10 reps - Ankle Alphabet in Elevation  - 2 x daily - 7 x weekly - 2 sets - Seated Toe Raise  - 2 x daily - 7 x weekly  - 2 sets - 10 reps - Seated Long Arc Quad  - 2 x daily - 7 x weekly - 2 sets - 10 reps - Long Sitting Ankle Eversion with Resistance  - 2 x daily - 7 x weekly - 2 sets - 10 reps - Long Sitting Ankle Dorsiflexion with Anchored Resistance  - 2 x daily - 7 x weekly - 2 sets - 10 reps - Long Sitting Ankle Inversion with Resistance  - 2 x daily - 7 x weekly - 2 sets - 10 reps   ASSESSMENT:   CLINICAL IMPRESSION: [name] has progressed fair with therapy.  Improved impairments include: Slight increase in ROM, improved strength and stair negotiation. Progressions needed include: ROM, ankle stability, strength and gait.  Barriers to progress include: chronic hip pain with need of THA.  Pt has made slight improvements since starting PT. She is now able to ambulate with an improved gait pattern and decreased pain. 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 Discussed with pt requesting more auth for 4 more sessions to continue working on ambulation, balance and stair negotiation. Please see GOALS section for progress on short term and long term goals established at evaluation.  I recommend D/C home with HEP after next 4 sessions; pt agrees with plan. Patient soft spoken and difficult to read.    OBJECTIVE IMPAIRMENTS  Abnormal gait, decreased activity tolerance, decreased balance, decreased endurance, decreased knowledge of condition, decreased mobility, difficulty walking, decreased ROM, decreased strength, impaired flexibility, impaired sensation, impaired tone, and pain.    ACTIVITY LIMITATIONS carrying, lifting, bending, standing, squatting, stairs, transfers, dressing, and caring for others   PARTICIPATION LIMITATIONS: meal prep, cleaning, laundry, shopping, and community activity   PERSONAL FACTORS Education, Past/current experiences, Time since onset of injury/illness/exacerbation, and 1 comorbidity: Sickle cell anemia  are also affecting patient's  functional outcome.    REHAB POTENTIAL: Good   CLINICAL DECISION MAKING: Stable/uncomplicated   EVALUATION COMPLEXITY: Low     GOALS: Goals reviewed with patient? No   SHORT TERM GOALS: Target date: 12/25/2021  Pt will be I and compliant with initial HEP. Baseline: Patient able to demo to PT w/o need of cuing Goal status: Met   3.  Pt will increase LEFS score to 28/80 indicating minimal detectable change Baseline: 12/11/21 17/80,  Goal status: 12/31/2021, 29/80    LONG TERM GOALS: Target date: 01/22/2022    Pt will be I and compliant with comprhensiveHEP. Baseline:  Goal status: ongoing as HEP is evolving   2.  Increase LEFS to 56/80 indicating minimal detectable change Baseline: 12/11/21 17/80  Goal status: ongoing per target date   3.  Pt will report <2/10 pain at baseline  Baseline:  Goal status: Ongoing as pain reported as moderate   4.  Pt will improve DF to neutral on L LE. Baseline: 12/11/21 -2d DF PROM noted at 0/90d knee position Goal status: Ongoing   5.  Pt will be able to ascend/ descend stairs with alternating gait patten.  Baseline: 12/11/21 Able to ascend with [facility] and single rail alternating but descends with step to pattern Goal status: Pt is able to perform alternating pattern, but has increased pain.    PLAN: PT FREQUENCY: 2x/week   PT DURATION: 2 weeks   PLANNED INTERVENTIONS: Therapeutic exercises, Therapeutic activity, Neuromuscular re-education, Balance training, Gait training, Patient/Family education, Joint manipulation, Joint mobilization, Stair training, Orthotic/Fit training, Aquatic Therapy, Dry Needling, Electrical stimulation, Cryotherapy, Moist heat, Manual lymph drainage, Compression bandaging, scar mobilization, Taping, Traction, Ultrasound, Manual therapy, and Re-evaluation   PLAN FOR NEXT SESSION: Assess HEP/update PRN, continue to progress functional mobility, strengthen L LE musculature. Decrease patients pain and help minimize functional  deficits.     Lynden Ang, PT 12/31/2021, 4:49 PM   Check all possible CPT codes: [phone] - PT Re-evaluation, 97110- Therapeutic Exercise, [phone]- Neuro Re-education, [phone] - Gait Training, [phone] - Manual Therapy, 97530 - Therapeutic Activities, and [phone] - Self Care     If treatment provided at initial evaluation, no treatment charged due to lack of authorization.

## 2021-12-27 NOTE — Progress Notes (Signed)
JHANA, GIARRATANO ([id number]) Visit Report for 12/26/2021 Chief Complaint Document Details Patient Name: Date of Service: [name] 12/26/2021 10:15 A M Medical Record Number: [id number] Patient Account Number: [id number] Date of Birth/Sex: Treating RN: April 17, 1971 (51 y.o. [name] Primary Care Provider: [name] Other Clinician: Referring Provider: Treating Provider/Extender: Elyse Jarvis Weeks in Treatment: 38 Information Obtained from: Patient Chief Complaint the patient is here for evaluation of her bilateral lower extremity sickle cell ulcers 04/17/2021; patient comes in for substantial wounds on the right and left lower leg Electronic Signature(s) Signed: 12/26/2021 11:11:38 AM By: Fredirick Maudlin MD FACS Entered By: Fredirick Maudlin on 12/26/2021 11:11:38 -------------------------------------------------------------------------------- Cellular or Tissue Based Product Details Patient Name: Date of Service: [name], [name] 12/26/2021 10:15 A M Medical Record Number: [id number] Patient Account Number: [id number] Date of Birth/Sex: Treating RN: 1970-09-17 (51 y.o. [name] Primary Care Provider: [name] Other Clinician: Referring Provider: Treating Provider/Extender: Elyse Jarvis Weeks in Treatment: 36 Cellular or Tissue Based Product Type Wound #17 Right,Lateral Lower Leg Applied to: Performed By: Physician Fredirick Maudlin, MD Cellular or Tissue Based Product Type: Other Level of Consciousness (Pre-procedure): Awake and Alert Pre-procedure Verification/Time Out Yes - 10:41 Taken: Location: trunk / arms / legs Wound Size (sq cm): 29.25 Product Size (sq cm): 6 Waste Size (sq cm): 1.5 Waste Reason: not needed Amount of Product Applied (sq cm): 4.5 Instrument Used: Nippers, Scissors Lot #: [id number] Order #: [id number] Expiration Date: 03/23/2024 Fenestrated: Yes Instrument:  Blade Reconstituted: No Secured: Yes Secured With: Steri-Strips Dressing Applied: Yes Primary Dressing: adaptic, gauze Procedural Pain: 0 Post Procedural Pain: 0 Response to Treatment: Procedure was tolerated well Level of Consciousness (Post- Awake and Alert procedure): Post Procedure Diagnosis Same as Pre-procedure Electronic Signature(s) Signed: 12/26/2021 12:21:54 PM By: Fredirick Maudlin MD FACS Signed: 12/27/2021 4:41:51 PM By: Sharyn Creamer RN, BSN Entered By: Sharyn Creamer on 12/26/2021 11:48:32 -------------------------------------------------------------------------------- Cellular or Tissue Based Product Details Patient Name: Date of Service: [name], [name] 12/26/2021 10:15 A M Medical Record Number: [id number] Patient Account Number: [id number] Date of Birth/Sex: Treating RN: [birth year] (51 y.o. [name] Primary Care Provider: [name] Other Clinician: Referring Provider: Treating Provider/Extender: Elyse Jarvis Weeks in Treatment: 36 Cellular or Tissue Based Product Type Wound #21 Right,Medial Ankle Applied to: Performed By: Physician Fredirick Maudlin, MD Cellular or Tissue Based Product Type: Other Level of Consciousness (Pre-procedure): Awake and Alert Pre-procedure Verification/Time Out Yes - 10:41 Taken: Location: trunk / arms / legs Wound Size (sq cm): 20.3 Product Size (sq cm): 6 Waste Size (sq cm): 2 Waste Reason: not needed Amount of Product Applied (sq cm): 4 Instrument Used: Nippers, Scissors Lot #: [id number] Order #: [id number] Expiration Date: 03/23/2024 Fenestrated: Yes Instrument: Blade Reconstituted: No Secured: Yes Secured With: Steri-Strips Dressing Applied: Yes Primary Dressing: adaptic, gauze Procedural Pain: 0 Post Procedural Pain: 0 Response to Treatment: Procedure was tolerated well Level of Consciousness (Post- Awake and Alert procedure): Post Procedure Diagnosis Same as  Pre-procedure Electronic Signature(s) Signed: 12/26/2021 12:21:54 PM By: Fredirick Maudlin MD FACS Signed: 12/27/2021 4:41:51 PM By: Sharyn Creamer RN, BSN Entered By: Sharyn Creamer on 12/26/2021 11:49:33 -------------------------------------------------------------------------------- Debridement Details Patient Name: Date of Service: [name], [name] 12/26/2021 10:15 A M Medical Record Number: [id number] Patient Account Number: [id number] Date of Birth/Sex: Treating RN: December 07, 1970 (51 y.o. [name] Primary Care Provider: [name] Other Clinician: Referring Provider: Treating Provider/Extender: Darrick Penna,  [place] Weeks in Treatment: 36 Debridement Performed for Assessment: Wound #17 Right,Lateral Lower Leg Performed By: Physician Fredirick Maudlin, MD Debridement Type: Debridement Level of Consciousness (Pre-procedure): Awake and Alert Pre-procedure Verification/Time Out Yes - 10:41 Taken: Start Time: 10:41 Pain Control: Lidocaine 5% topical ointment T Area Debrided (L x W): otal 6.5 (cm) x 4.5 (cm) = 29.25 (cm) Tissue and other material debrided: Non-Viable, Slough, Subcutaneous, Slough Level: Skin/Subcutaneous Tissue Debridement Description: Excisional Instrument: Curette Bleeding: Minimum Hemostasis Achieved: Pressure Procedural Pain: 3 Post Procedural Pain: 0 Response to Treatment: Procedure was tolerated well Level of Consciousness (Post- Awake and Alert procedure): Post Debridement Measurements of Total Wound Length: (cm) 6.5 Width: (cm) 4.5 Depth: (cm) 0.1 Volume: (cm) 2.297 Character of Wound/Ulcer Post Debridement: Improved Post Procedure Diagnosis Same as Pre-procedure Electronic Signature(s) Signed: 12/26/2021 12:21:54 PM By: Fredirick Maudlin MD FACS Signed: 12/27/2021 4:41:51 PM By: Sharyn Creamer RN, BSN Entered By: Sharyn Creamer on 12/26/2021  10:44:25 -------------------------------------------------------------------------------- Debridement Details Patient Name: Date of Service: [name], [name] 12/26/2021 10:15 A M Medical Record Number: [id number] Patient Account Number: [id number] Date of Birth/Sex: Treating RN: April 23, 1971 (51 y.o. [name] Primary Care Provider: [name] Other Clinician: Referring Provider: Treating Provider/Extender: Elyse Jarvis Weeks in Treatment: 36 Debridement Performed for Assessment: Wound #21 Right,Medial Ankle Performed By: Physician Fredirick Maudlin, MD Debridement Type: Debridement Severity of Tissue Pre Debridement: Fat layer exposed Level of Consciousness (Pre-procedure): Awake and Alert Pre-procedure Verification/Time Out Yes - 10:41 Taken: Start Time: 10:41 Pain Control: Lidocaine 5% topical ointment T Area Debrided (L x W): otal 7 (cm) x 2.9 (cm) = 20.3 (cm) Tissue and other material debrided: Non-Viable, Eschar, Slough, Subcutaneous, Slough Level: Skin/Subcutaneous Tissue Debridement Description: Excisional Instrument: Curette Bleeding: Minimum Hemostasis Achieved: Pressure Procedural Pain: 1 Post Procedural Pain: 0 Response to Treatment: Procedure was tolerated well Level of Consciousness (Post- Awake and Alert procedure): Post Debridement Measurements of Total Wound Length: (cm) 7 Width: (cm) 2.9 Depth: (cm) 0.1 Volume: (cm) 1.594 Character of Wound/Ulcer Post Debridement: Improved Severity of Tissue Post Debridement: Fat layer exposed Post Procedure Diagnosis Same as Pre-procedure Electronic Signature(s) Signed: 12/26/2021 12:21:54 PM By: Fredirick Maudlin MD FACS Signed: 12/27/2021 4:41:51 PM By: Sharyn Creamer RN, BSN Entered By: Sharyn Creamer on 12/26/2021 10:47:17 -------------------------------------------------------------------------------- HPI Details Patient Name: Date of Service: [name], [name] 12/26/2021 10:15 A  M Medical Record Number: [id number] Patient Account Number: [id number] Date of Birth/Sex: Treating RN: 12/18/1970 (51 y.o. [name] Primary Care Provider: [name] Other Clinician: Referring Provider: Treating Provider/Extender: Elyse Jarvis Weeks in Treatment: 36 History of Present Illness Location: medial and lateral ankle region on the right and left medial malleolus Quality: Patient reports experiencing a shooting pain to affected area(s). Severity: Patient states wound(s) are getting worse. Duration: right lower extremity bimalleolar ulcers have been present for approximately 2 years; the rright meedial malleolus ulcer has been there proximally 6 months Timing: Pain in wound is constant (hurts all the time) Context: The wound would happen gradually ssociated Signs and Symptoms: Patient reports having increase discharge. A HPI Description: 51 year old patient with a history of sickle cell anemia who was last seen by me with ulceration of the right lower extremity above the ankle and was referred to Dr. Leland Johns for a surgical debridement as I was unable to do anything in the office due to excruciating pain. At that stage she was referred from the plastic surgery service to dermatology who treated her for a  skin infection with doxycycline and then Levaquin and a local antibiotic ointment. I understand the patient has since developed ulceration on the left ankle both medial and lateral and was now referred back to the wound center as dermatology has finished the management. I do not have any notes from the dermatology department Old notes: 51 year old patient with a history of sickle cell anemia, pain bilateral lower extremities, right lower extremity ulcer and has a history of receiving a skin graft( Theraskin) several months ago. She has been visiting the wound center [facility] and was seen by Dr. Dellia Nims and Dr. Leland Johns. after prolonged conservator  therapy between July 2016 and January 2017. She had been seen by the plastic surgeon and taken to the OR for debridement and application of Theraskin. She had 3 applications of Theraskin and was then treated with collagen. Prior to that she had a history of similar problems in 2014 and was treated conservatively. Had a reflux study done for the right lower extremity in August 2016 without reflux or DVT . Past medical history significant for sickle cell disease, anemia, leg ulcers, cholelithiasis,and has never been a smoker. Once the patient was discharged on the wound center she says within 2 or 3 weeks the problems recurred and she has been treating it conservatively. since I saw her 3 weeks ago at [facility] she has been unable to get her dressing material but has completed a course of doxycycline. 6/7/ 2017 -- lower extremity venous duplex reflux evaluation was done No evidence of SVT or DVT in the RLL. No venous incompetence in the RLL. No further vascular workup is indicated at this time. She was seen by Dr. [name], on 10/04/2015. She agreed with the plan of taking her to the OR for debridement and application of theraskin and would also take biopsies to rule out pyoderma gangrenosum. Follow-up note dated May 31 received and she was status post application of Theraskin to multiple ulcers around the right ankle. Pathology did not show evidence of malignancy or pyoderma gangrenosum. She would continue to see as in the wound clinic for further care and see Dr. Leland Johns as needed. The patient brought the biopsy report and it was consistent with stasis ulcer no evidence of malignancy and the comment was that there was some adjacent neovascularization, fibrosis and patchy perivascular chronic inflammation. 11/15/2015 -- today we applied her first application of Theraskin 11/30/15; TheraSkin #2 12/13/2015 -- she is having a lot of pain locally and is here for possible application of a  theraskin today. 01/16/2016 -- the patient has significant pain and has noticed despite in spite of all local care and oral pain medication. It is impossible to debride her in the office. 02/06/2016 -- I do not see any notes from Dr. Iran Planas( the patient has not made a call to the office know as she heard from them) and the only visit to recently was with her PCP Dr. Danella Penton -- I saw her on 01/16/2016 and prescribed 90 tablets of oxycodone 10 mg and did lab work and screening for HIV. the HIV was negative and hemoglobin was 6.3 with a WBC count of 14.9 and hematocrit of 17.8 with platelets of 561. 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 The bimalleolar ulcers to the right lower extremity have been present for approximately 2 years, the left medial malleolus ulcer has been present approximately 6 months. She has  followed with Dr.Thimmappa in the past and has had a total of 3 applications of Theraskin (01/2015, 09/2015, 06/17/16). She has also followed with Dr. Con Memos here in the clinic and has received 2 applications of TheraSkin (11/10/15, 11/30/15). The patient does experience chronic, and is not amenable to debridement. 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 She is not currently on any antibiotic therapy and has not been treated with any recently. 07/17/2016 -- was seen by Dr. Iran Planas of plastic surgery who saw her 2 weeks postop application of Theraskin #3. She had removed her dressing and asked her to apply silver alginate on alternate days and follow-up back with the wound center. Future debridements and application of skin substitute would have to be done in the hospital due to her high risk for anesthesia. READMISSION 04/17/2021 Patient is now a 51 year old woman that we have had in this clinic for a prolonged period of time  and 2016-2017 and then again for 2 visits in February 2018. At that point she had wounds on the right lower leg predominantly medial. She had also been seen by plastic surgery Dr. Leland Johns who I believe took her to the OR for operative debridement and application of TheraSkin in 2017. After she left our clinic she was followed for a very prolonged period of time in the wound care center in [name] who then referred her ultimately to [facility] where she was seen by Dr. Vernona Rieger. Again taken her to the OR for skin grafting which apparently did not take. She had multiple other attempts at dressings although I have not really looked over all of these notes in great detail. She has not been seen in a wound care center in about a year. She states over the last year in addition to her right lower leg she has developed wounds on the left lower leg quite extensive. She is using Xeroform to all of these wounds without really any improvement. She also has Medicaid which does not cover wound products. The patient has had vascular work-ups in the past including most recently on 03/28/2021 showing biphasic waveforms on the right triphasic at the PTA and biphasic at the dorsalis pedis on the left. She was unable to tolerate any degree of compression to do ABIs. Unfortunately TBI's were also not done. She had venous reflux studies done in 2017. This did not show any evidence of a DVT or SVT and no venous incompetence was noted in the right leg at the time this was the only side with the wound As noted I did not look all over her old records. She apparently had a course of HBO and [facility] although I am not sure what the indication would have been. In any case she developed seizures and terminated treatment earlier. She is generally much more disabled than when we last saw her in clinic. She can no longer walk pretty much wheelchair-bound because predominantly of pain in the left hip. 04/24/2021; the patient tolerated the  wraps we put on. We used Santyl and Hydrofera Blue under compression. I brought her back for a nurse visit for a change in dressing. With Medicaid we will have a hard time getting anything paid for and hence the need for compression. She arrives in clinic with all the wounds looking somewhat better in terms of surface 12/20; circumferential wound on the right from the lateral to the medial. She has open areas on the left medial and left  lateral x2 on all of this with the same surface. This does not look completely healthy although she does have some epithelialization. She is not complaining of a lot of pain which is unusual for her sickle ulcers. I have not looked over her extensive records from [facility]. She had recent arterial studies and has a history of venous reflux studies I will need to look these over although I do not believe she has significant arterial disease 2023 05/22/2021; patient's wound areas measure slightly smaller. Still a lot of drainage coming from the right we have been using Hydrofera Blue and Santyl with some improvement in the wound surfaces. She tells me she will be getting transfused later in the week for her underlying sickle cell anemia I have looked over her recent arterial studies which were done in the fall. This was in November and showed biphasic and triphasic waveforms but she could not tolerate ABIs because of pressure and unfortunately TBI's were not done. She has not had recent venous reflux studies that I can see 1/10; not much change about the same surface area. This has a yellowish surface to it very gritty. We have been using Santyl and Hydrofera Blue for a prolonged period. Culture I did last week showed methicillin sensitive staph aureus "rare". Our intake nurse reports greenish drainage which may be the Hydrofera Blue itself 1/17; wounds are continue to measure smaller although I am not sure about the accuracy here. Especially the areas on the right are  covered in what looks to be a nonviable surface although she does have some epithelialization. Similarly she has areas on the left medial and left lateral ankle area which appear to have a better surface and perhaps are slightly smaller. We have been using Santyl and Hydrofera Blue. She cannot tolerate mechanical debridements She went for her reflux studies which showed significant reflux at the greater saphenous vein at the saphenofemoral junction as well as the greater saphenous vein in the proximal calf on the left she had reflux in the thigh and the common femoral vein and supra vein Fishel vein reflux in the greater saphenous vein. I will have vein and vascular look at this. My thoughts have been that these are likely sickle wounds. I looked through her old records from [facility] wound care center and then when she graduated to [facility] wound care center where she saw Dr. Zigmund Daniel and Dr. Vernona Rieger. Although I can see she had reflux studies done I do not see that she actually saw a vein and vascular. I went over the fact that she had operative debridements and actual skin grafting that did not take. I do not think these wounds have ever really progressed towards healing 1/31;Substantial wounds on the right ankle area. Hyper granulated very gritty adherent debris on the surface. 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 On the left she has no DVT or SVT deep vein reflux in the common femoral vein. Superficial vein reflux in the greater saphenous vein on although the vein diameter is not really all that large. I do not think there is  anything that can  be done with these although I am going to send her for consultation to vein and vascular. 2/7; Wound exam; substantial wound area on the right posterior ankle area and areas on the left medial ankle and left lateral ankle. I was able to debride the left medial ankle last week fairly aggressively and it is back this week to a completely nonviable surface She will see vascular surgery this Friday and I would like them to review the venous studies and also any comments on her arterial status. If they do not see an issue here I am going to refer her to plastic surgery for an operative debridement perhaps intraoperative ACell or Integra. Eventually she will require a deep tissue culture again 2/14; substantial wound area on the right posterior ankle, medial ankle. We have been using silver alginate The patient was seen by vein and vascular she had both venous reflux studies and arterial studies. 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 She had no evidence of superficial venous thrombosis. She did have superficial vein reflux at the saphenofemoral junction and the greater saphenous vein. On the left no evidence of a DVT no evidence of superficial venous throat thrombosis she did have deep vein reflux in the common femoral vein and superficial vein reflux in the greater saphenous vein but these were not felt to be amenable to ablation. In terms of arterial studies she had triphasic and wife biphasic wave waveforms bilaterally not felt to have a significant arterial issue. I do not get the feeling that they felt that any part of her nonhealing wounds were related to either arterial or venous issues. They did note that she had venous reflux at the right at the [facility] and [facility]. And also on the left there were reflux in the deep system at the common femoral vein and greater saphenous vein in the proximal thigh. Nothing  amenable to ablation. 2/20; she is making some decent progress on the right where there is nice skin between the 2 open areas on the right ankle. The surfaces here do not look viable yet there is some surrounding epithelialization. She still has a small area on the left medial ankle area. Hyper-granulated Jody's away always 2/28 patient has an appointment with plastic surgery on 3/8. We will see her back on 3/9. She may have to call us to get the area redressed. We've been using Santyl under silver alginate. We made a nice improvement on the left medial ankle. The larger wounds on the right also looks somewhat better in terms of epithelialization although I think they could benefit from an aggressive debridement if plastic surgery would be willing to do that. Perhaps placement of Integra or a cell 07/26/2021: She saw Dr. Claudia Desanctis yesterday. He raised the question as to whether or not this might be pyoderma and wanted to wait until that question was answered by dermatology before proceeding with any sort of operative debridement. We have continue to use Santyl under silver alginate with Kerlix and Coban wraps. Overall, her wounds appear to be continuing to contract and epithelialize, with some granulation tissue present. There continues to be some slough on all wound surfaces. 08/09/2021: She has not been able to get an appointment with dermatology because apparently the offices in [place] do not accept Medicaid. She is looking into whether or not she can be seen at the main [facility] dermatology  clinic. This is necessary because plastic surgery is concerned that her wounds might represent pyoderma and they did not want to do any procedure until that was clarified. We have been using Santyl under silver alginate with Kerlix and Coban wraps. Today, there was a greater amount of drainage on her dressings with a slight green discoloration and significant odor. Despite this, her wounds  continue to contract and epithelialize. There is pale granulation tissue present and actually, on the left medial ankle, the granulation tissue is a bit hypertrophic. 08/16/2021: Last week, I took a culture and this grew back rare methicillin-resistant Staph aureus and rare corynebacterium. The MRSA was sensitive to gentamicin which we began applying topically on an empiric basis. This week, her wounds are a bit smaller and the drainage and odor are less. Her primary care provider is working on assisting the patient with a dermatology evaluation. She has been in silver alginate over the gentamicin that was started last week along with Kerlix and Coban wraps. 08/23/2021: Because she has Medicaid, we have been unable to get her into see any dermatologist in the [place] to rule out pyoderma gangrenosum, which was a requirement from plastic surgery prior to any sort of debridement and grafting. Despite this, however, all of her wounds continue to get smaller. The wound on her left medial ankle is nearly closed. There is no odor from the wounds, although she still accumulates a modest amount of drainage on her dressings. 08/30/2021: The lateral right ankle wound and the medial left ankle wound are a bit smaller today. The medial right ankle wound is about the same size. They are less tender. We have still been unable to get her into dermatology. 09/06/2021: All of the wounds are about the same size today. She continues to endorse minimal pain. I communicated with Dr. Claudia Desanctis in plastic surgery regarding our issues getting a dermatology appointment; he was out of town but indicated that he would look into perhaps performing the biopsy in his office and will have his office contact her. 09/14/2021: The patient has an appointment in dermatology, but it is not until October. Her wounds are roughly the same; she continues to have very thick purulent-looking drainage on her dressings. 09/20/2021: The left medial wound is  nearly closed and just has a bit of accumulated eschar on the surface. The right medial and lateral ankle wounds are perhaps a little bit smaller. They continue to have a very pale surface with accumulation of thin slough. 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 I did not think [employer] was indicated based on this. She is getting topical mupirocin with Prisma silver collagen. 10/04/2021: The patient was not seen in clinic last week due to childcare coverage issues. In the interim, the left medial leg wound has closed. The right sided leg wounds are smaller. There is more granulation tissue coming through, particularly on the lateral wound. The surface remains somewhat gritty. We have been applying topical mupirocin and Prisma silver collagen. 10/11/2021: The left medial leg wound remains closed. She does complain of some anesthetic sensation to the area. Both of the right-sided leg wounds are smaller but still have accumulated slough. 10/18/2021: Both right-sided leg wounds are minimally smaller this week. She still continues to accumulate slough and has thick drainage on her dressings. 10/23/2021: Both wounds continue to contract. There is still slough buildup. She has been approved for a keratin-based skin substitute trial product but it will not be available until  next week. 10/30/2021: The wounds are about the same to perhaps slightly smaller. There is still continued slough buildup. Unfortunately, the rep for the keratin based product did not show up today and did not answer his phone when called. 11/08/2021: The wounds are little bit smaller today. She continues to have thick drainage but the surfaces are relatively clean with just a little bit of slough accumulation. She reported to me today that she is unable to completely flex her left ankle and on examination it seems this is potentially related to scar tissue from her wounds. We do have the ProgenaMatrix trial product  available for her today. 11/15/2021: Both wounds are smaller today. There is some slough accumulation on the surfaces, but the medial wound, in particular looks like it is filling in and is less deep. She did hear from physical therapy and she is going to start working with them on July 11. She is here for her second application of the trial skin substitute, ProgenaMatrix. 11/22/2021: Both wounds continue to contract, the medial more dramatically than the lateral. Both wounds have a layer of slough on the surface, but underneath this, the gritty fibrous tissue has a little bit more of a pink cast to it rather than being as pale as it has been. 11/29/2021: The wounds are roughly the same size this week, perhaps a millimeter or 2 smaller. The medial wound has filled in and is nearly flush with the surrounding skin surface. She continues to have a lot of slough accumulation on both surfaces. 12/06/2021: No significant change to her wounds, but she has a new opening on her dorsal foot, just distal to the right lateral ankle wound. The area on her left medial ankle that reopened looks a little bit larger today. She has quite a bit of pain associated with the new wound. 12/12/2021: Her wounds look about the same but the new opening on her right lateral dorsal foot is a little bit bigger. She continues to have a fair amount of pain with this wound. 12/26/2021: The left medial ankle wound is tiny and superficial. She has 2 areas of crusting on her left lateral ankle, however, that appear to be threatening to open again. Her right medial ankle wound is a little bit smaller today but still continues to accumulate thick rubbery slough. The new dorsal foot wound is exquisitely painful but there is no odor or purulent drainage. No erythema or induration. The right lateral ankle wound looks about the same today, again with thick rubbery slough. Electronic Signature(s) Signed: 12/26/2021 11:12:53 AM By: Fredirick Maudlin MD  FACS Entered By: Fredirick Maudlin on 12/26/2021 11:12:53 -------------------------------------------------------------------------------- Physical Exam Details Patient Name: Date of Service: [name], [name] 12/26/2021 10:15 A M Medical Record Number: [id number] Patient Account Number: [id number] Date of Birth/Sex: Treating RN: 1971-01-31 (51 y.o. [name] Primary Care Provider: [name] Other Clinician: Referring Provider: Treating Provider/Extender: Elyse Jarvis Weeks in Treatment: 36 Constitutional . . . . No acute distress.Marland Kitchen Respiratory Normal work of breathing on room air.. Notes 12/26/2021: The left medial ankle wound is tiny and superficial. She has 2 areas of crusting on her left lateral ankle, however, that appear to be threatening to open again. Her right medial ankle wound is a little bit smaller today but still continues to accumulate thick rubbery slough. The new dorsal foot wound is exquisitely painful but there is no odor or purulent drainage. No erythema or induration. The right lateral ankle wound  looks about the same today, again with thick rubbery slough. Electronic Signature(s) Signed: 12/26/2021 11:13:27 AM By: Fredirick Maudlin MD FACS Entered By: Fredirick Maudlin on 12/26/2021 11:13:26 -------------------------------------------------------------------------------- Physician Orders Details Patient Name: Date of Service: [name], [name] 12/26/2021 10:15 A M Medical Record Number: [id number] Patient Account Number: [id number] Date of Birth/Sex: Treating RN: Apr 11, 1971 (51 y.o. [name] Primary Care Provider: [name] Other Clinician: Referring Provider: Treating Provider/Extender: Elyse Jarvis Weeks in Treatment: 70 Verbal / Phone Orders: No Diagnosis Coding ICD-10 Coding Code Description L97.818 Non-pressure chronic ulcer of other part of right lower leg with other specified  severity L97.828 Non-pressure chronic ulcer of other part of left lower leg with other specified severity 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 Follow-up Appointments ppointment in 1 week. - Dr. Celine Ahr - Room 3 Return A Other: - PROGENA MATRIX #7 Cellular or Tissue Based Products Other Cellular or Tissue Based Products Orders/Instructions: - PROGENA MATRIX#7 Bathing/ Shower/ Hygiene May shower with protection but do not get wound dressing(s) wet. - Can get cast protector bags at [facility] or [facility] Edema Control - Lymphedema / SCD / Other Elevate legs to the level of the heart or above for 30 minutes daily and/or when sitting, a frequency of: - throughout the day Avoid standing for long periods of time. Exercise regularly Additional Orders / Instructions Follow Nutritious Diet Wound Treatment Wound #17 - Lower Leg Wound Laterality: Right, Lateral Cleanser: Soap and Water 1 x Per Week/30 Days Discharge Instructions: May shower and wash wound with dial antibacterial soap and water prior to dressing change. Cleanser: Wound Cleanser 1 x Per Week/30 Days Discharge Instructions: Cleanse the wound with wound cleanser prior to applying a clean dressing using gauze sponges, not tissue or cotton balls. Peri-Wound Care: Triamcinolone 15 (g) 1 x Per Week/30 Days Discharge Instructions: Use triamcinolone 15 (g) as directed Peri-Wound Care: Sween Lotion (Moisturizing lotion) 1 x Per Week/30 Days Discharge Instructions: Apply moisturizing lotion as directed Topical: Mupirocin Ointment 1 x Per Week/30 Days Discharge Instructions: Apply Mupirocin (Bactroban) as instructed Prim Dressing: [place] ary 1 x Per Week/30 Days Secondary Dressing: ABD Pad, 5x9 1 x Per Week/30 Days Discharge Instructions: Apply over primary dressing as directed. Secondary Dressing: Zetuvit Plus 4x8 in 1 x Per Week/30 Days Discharge Instructions: Apply over primary dressing as directed. Compression Wrap: Kerlix Roll  4.5x3.1 (in/yd) 1 x Per Week/30 Days Discharge Instructions: Apply Kerlix and Coban compression as directed. Compression Wrap: Coban Self-Adherent Wrap 4x5 (in/yd) 1 x Per Week/30 Days Discharge Instructions: Apply over Kerlix as directed. Wound #21 - Ankle Wound Laterality: Right, Medial Cleanser: Soap and Water 1 x Per Week/30 Days Discharge Instructions: May shower and wash wound with dial antibacterial soap and water prior to dressing change. Cleanser: Wound Cleanser 1 x Per Week/30 Days Discharge Instructions: Cleanse the wound with wound cleanser prior to applying a clean dressing using gauze sponges, not tissue or cotton balls. Peri-Wound Care: Triamcinolone 15 (g) 1 x Per Week/30 Days Discharge Instructions: Use triamcinolone 15 (g) as directed Peri-Wound Care: Sween Lotion (Moisturizing lotion) 1 x Per Week/30 Days Discharge Instructions: Apply moisturizing lotion as directed Topical: Mupirocin Ointment 1 x Per Week/30 Days Discharge Instructions: Apply Mupirocin (Bactroban) as instructed Prim Dressing: [place] ary 1 x Per Week/30 Days Secondary Dressing: ABD Pad, 5x9 1 x Per Week/30 Days Discharge Instructions: Apply over primary dressing as directed. Secondary Dressing: Zetuvit Plus 4x8 in 1 x Per Week/30 Days Discharge Instructions: Apply over  primary dressing as directed. Compression Wrap: Kerlix Roll 4.5x3.1 (in/yd) 1 x Per Week/30 Days Discharge Instructions: Apply Kerlix and Coban compression as directed. Compression Wrap: Coban Self-Adherent Wrap 4x5 (in/yd) 1 x Per Week/30 Days Discharge Instructions: Apply over Kerlix as directed. Wound #22 - Ankle Wound Laterality: Left, Medial Cleanser: Soap and Water Discharge Instructions: May shower and wash wound with dial antibacterial soap and water prior to dressing change. Topical: Mupirocin Ointment Discharge Instructions: Apply Mupirocin (Bactroban) as instructed Prim Dressing: KerraCel Ag Gelling Fiber Dressing,  2x2 in (silver alginate) ary Discharge Instructions: Apply silver alginate to wound bed as instructed Secondary Dressing: Bordered Gauze, 2x2 in Discharge Instructions: Apply over primary dressing as directed. Patient Medications llergies: No Known Allergies A Notifications Medication Indication Start End 12/26/2021 doxycycline hyclate DOSE oral 100 mg capsule - 1 capsule p.o. twice daily x 10 days Electronic Signature(s) Signed: 12/26/2021 12:21:54 PM By: Fredirick Maudlin MD FACS Previous Signature: 12/26/2021 11:14:51 AM Version By: Fredirick Maudlin MD FACS Entered By: Fredirick Maudlin on 12/26/2021 11:15:04 -------------------------------------------------------------------------------- Problem List Details Patient Name: Date of Service: [name], [name] 12/26/2021 10:15 A M Medical Record Number: [id number] Patient Account Number: [id number] Date of Birth/Sex: Treating RN: 11/13/1970 (51 y.o. [name] Primary Care Provider: [name] Other Clinician: Referring Provider: Treating Provider/Extender: Elyse Jarvis Weeks in Treatment: 36 Active Problems ICD-10 Encounter Code Description Active Date MDM Diagnosis L97.818 Non-pressure chronic ulcer of other part of right lower leg with other specified 04/17/2021 No Yes severity L97.828 Non-pressure chronic ulcer of other part of left lower leg with other specified 04/17/2021 No Yes severity 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 04/17/2021 No Yes Inactive Problems Resolved Problems Electronic Signature(s) Signed: 12/26/2021 11:11:10 AM By: Fredirick Maudlin MD FACS Entered By: Fredirick Maudlin on 12/26/2021 11:11:10 -------------------------------------------------------------------------------- Progress Note Details Patient Name: Date of Service: [name], [name] 12/26/2021 10:15 A M Medical Record Number: [id number] Patient Account Number: [id number] Date of Birth/Sex: Treating  RN: 12-18-1970 (51 y.o. [name] Primary Care Provider: [name] Other Clinician: Referring Provider: Treating Provider/Extender: Elyse Jarvis Weeks in Treatment: 26 Subjective Chief Complaint Information obtained from Patient the patient is here for evaluation of her bilateral lower extremity sickle cell ulcers 04/17/2021; patient comes in for substantial wounds on the right and left lower leg History of Present Illness (HPI) The following HPI elements were documented for the patient's wound: Location: medial and lateral ankle region on the right and left medial malleolus Quality: Patient reports experiencing a shooting pain to affected area(s). Severity: Patient states wound(s) are getting worse. Duration: right lower extremity bimalleolar ulcers have been present for approximately 2 years; the rright meedial malleolus ulcer has been there proximally 6 months Timing: Pain in wound is constant (hurts all the time) Context: The wound would happen gradually Associated Signs and Symptoms: Patient reports having increase discharge. 51 year old patient with a history of sickle cell anemia who was last seen by me with ulceration of the right lower extremity above the ankle and was referred to Dr. Leland Johns for a surgical debridement as I was unable to do anything in the office due to excruciating pain. At that stage she was referred from the plastic surgery service to dermatology who treated her for a skin infection with doxycycline and then Levaquin and a local antibiotic ointment. I understand the patient has since developed ulceration on the left ankle both medial and lateral and was now referred back to the wound center as  dermatology has finished the management. I do not have any notes from the dermatology department Old notes: 51 year old patient with a history of sickle cell anemia, pain bilateral lower extremities, right lower extremity ulcer and has  a history of receiving a skin graft( Theraskin) several months ago. She has been visiting the wound center [facility] and was seen by Dr. Dellia Nims and Dr. Leland Johns. after prolonged conservator therapy between July 2016 and January 2017. She had been seen by the plastic surgeon and taken to the OR for debridement and application of Theraskin. She had 3 applications of Theraskin and was then treated with collagen. Prior to that she had a history of similar problems in 2014 and was treated conservatively. Had a reflux study done for the right lower extremity in August 2016 without reflux or DVT . Past medical history significant for sickle cell disease, anemia, leg ulcers, cholelithiasis,and has never been a smoker. Once the patient was discharged on the wound center she says within 2 or 3 weeks the problems recurred and she has been treating it conservatively. since I saw her 3 weeks ago at [facility] she has been unable to get her dressing material but has completed a course of doxycycline. 6/7/ 2017 -- lower extremity venous duplex reflux evaluation was done oo No evidence of SVT or DVT in the RLL. No venous incompetence in the RLL. No further vascular workup is indicated at this time. She was seen by Dr. [name], on 10/04/2015. She agreed with the plan of taking her to the OR for debridement and application of theraskin and would also take biopsies to rule out pyoderma gangrenosum. Follow-up note dated May 31 received and she was status post application of Theraskin to multiple ulcers around the right ankle. Pathology did not show evidence of malignancy or pyoderma gangrenosum. She would continue to see as in the wound clinic for further care and see Dr. Leland Johns as needed. The patient brought the biopsy report and it was consistent with stasis ulcer no evidence of malignancy and the comment was that there was some adjacent neovascularization, fibrosis and patchy perivascular chronic  inflammation. 11/15/2015 -- today we applied her first application of Theraskin 11/30/15; TheraSkin #2 12/13/2015 -- she is having a lot of pain locally and is here for possible application of a theraskin today. 01/16/2016 -- the patient has significant pain and has noticed despite in spite of all local care and oral pain medication. It is impossible to debride her in the office. 02/06/2016 -- I do not see any notes from Dr. Iran Planas( the patient has not made a call to the office know as she heard from them) and the only visit to recently was with her PCP Dr. Danella Penton -- I saw her on 01/16/2016 and prescribed 90 tablets of oxycodone 10 mg and did lab work and screening for HIV. the HIV was negative and hemoglobin was 6.3 with a WBC count of 14.9 and hematocrit of 17.8 with platelets of 561. 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 The bimalleolar ulcers to the right lower extremity have been present for approximately 2 years, the left medial malleolus ulcer has been present approximately 6 months. She has followed with Dr.Thimmappa in the past and has had a total of 3 applications of Theraskin (01/2015, 09/2015, 06/17/16). She has also followed with Dr. Con Memos here in the clinic and has received 2 applications of TheraSkin (  11/10/15, 11/30/15). The patient does experience chronic, and is not amenable to debridement. 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 She is not currently on any antibiotic therapy and has not been treated with any recently. 07/17/2016 -- was seen by Dr. Iran Planas of plastic surgery who saw her 2 weeks postop application of Theraskin #3. She had removed her dressing and asked her to apply silver alginate on alternate days and follow-up back with the wound center. Future debridements and application of skin substitute  would have to be done in the hospital due to her high risk for anesthesia. READMISSION 04/17/2021 Patient is now a 51 year old woman that we have had in this clinic for a prolonged period of time and 2016-2017 and then again for 2 visits in February 2018. At that point she had wounds on the right lower leg predominantly medial. She had also been seen by plastic surgery Dr. Leland Johns who I believe took her to the OR for operative debridement and application of TheraSkin in 2017. After she left our clinic she was followed for a very prolonged period of time in the wound care center in [facility] who then referred her ultimately to [facility] where she was seen by Dr. Vernona Rieger. Again taken her to the OR for skin grafting which apparently did not take. She had multiple other attempts at dressings although I have not really looked over all of these notes in great detail. She has not been seen in a wound care center in about a year. She states over the last year in addition to her right lower leg she has developed wounds on the left lower leg quite extensive. She is using Xeroform to all of these wounds without really any improvement. She also has Medicaid which does not cover wound products. The patient has had vascular work-ups in the past including most recently on 03/28/2021 showing biphasic waveforms on the right triphasic at the PTA and biphasic at the dorsalis pedis on the left. She was unable to tolerate any degree of compression to do ABIs. Unfortunately TBI's were also not done. She had venous reflux studies done in 2017. This did not show any evidence of a DVT or SVT and no venous incompetence was noted in the right leg at the time this was the only side with the wound As noted I did not look all over her old records. She apparently had a course of HBO and [facility] although I am not sure what the indication would have been. In any case she developed seizures and terminated treatment earlier. She is  generally much more disabled than when we last saw her in clinic. She can no longer walk pretty much wheelchair-bound because predominantly of pain in the left hip. 04/24/2021; the patient tolerated the wraps we put on. We used Santyl and Hydrofera Blue under compression. I brought her back for a nurse visit for a change in dressing. With Medicaid we will have a hard time getting anything paid for and hence the need for compression. She arrives in clinic with all the wounds looking somewhat better in terms of surface 12/20; circumferential wound on the right from the lateral to the medial. She has open areas on the left medial and left lateral x2 on all of this with the same surface. This does not look completely healthy although she does have some epithelialization. She is not complaining of a lot of pain which is unusual for her sickle  ulcers. I have not looked over her extensive records from [facility]. She had recent arterial studies and has a history of venous reflux studies I will need to look these over although I do not believe she has significant arterial disease 2023 05/22/2021; patient's wound areas measure slightly smaller. Still a lot of drainage coming from the right we have been using Hydrofera Blue and Santyl with some improvement in the wound surfaces. She tells me she will be getting transfused later in the week for her underlying sickle cell anemia I have looked over her recent arterial studies which were done in the fall. This was in November and showed biphasic and triphasic waveforms but she could not tolerate ABIs because of pressure and unfortunately TBI's were not done. She has not had recent venous reflux studies that I can see 1/10; not much change about the same surface area. This has a yellowish surface to it very gritty. We have been using Santyl and Hydrofera Blue for a prolonged period. Culture I did last week showed methicillin sensitive staph aureus "rare". Our intake  nurse reports greenish drainage which may be the Hydrofera Blue itself 1/17; wounds are continue to measure smaller although I am not sure about the accuracy here. Especially the areas on the right are covered in what looks to be a nonviable surface although she does have some epithelialization. Similarly she has areas on the left medial and left lateral ankle area which appear to have a better surface and perhaps are slightly smaller. We have been using Santyl and Hydrofera Blue. She cannot tolerate mechanical debridements She went for her reflux studies which showed significant reflux at the greater saphenous vein at the saphenofemoral junction as well as the greater saphenous vein in the proximal calf on the left she had reflux in the thigh and the common femoral vein and supra vein Fishel vein reflux in the greater saphenous vein. I will have vein and vascular look at this. My thoughts have been that these are likely sickle wounds. I looked through her old records from [facility] wound care center and then when she graduated to [facility] wound care center where she saw Dr. Zigmund Daniel and Dr. Vernona Rieger. Although I can see she had reflux studies done I do not see that she actually saw a vein and vascular. I went over the fact that she had operative debridements and actual skin grafting that did not take. I do not think these wounds have ever really progressed towards healing 1/31;Substantial wounds on the right ankle area. Hyper granulated very gritty adherent debris on the surface. 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 On the left she  has no DVT or SVT deep vein reflux in the common femoral vein. Superficial vein reflux in the greater saphenous vein on although the vein diameter is not really all that large. I do not think there is anything that can be done with these although I am going to send her for consultation to vein and vascular. 2/7; Wound exam; substantial wound area on the right posterior ankle area and areas on the left medial ankle and  left lateral ankle. I was able to debride the left medial ankle last week fairly aggressively and it is back this week to a completely nonviable surface She will see vascular surgery this Friday and I would like them to review the venous studies and also any comments on her arterial status. If they do not see an issue here I am going to refer her to plastic surgery for an operative debridement perhaps intraoperative ACell or Integra. Eventually she will require a deep tissue culture again 2/14; substantial wound area on the right posterior ankle, medial ankle. We have been using silver alginate The patient was seen by vein and vascular she had both venous reflux studies and arterial studies. 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 She had no evidence of superficial venous thrombosis. She did have superficial vein reflux at the saphenofemoral junction and the greater saphenous vein. On the left no evidence of a DVT no evidence of superficial venous throat thrombosis she did have deep vein reflux in the common femoral vein and superficial vein reflux in the greater saphenous vein but these were not felt to be amenable to ablation. In terms of arterial studies she had triphasic and wife biphasic wave waveforms bilaterally not felt to have a significant arterial issue. I do not get the feeling that they felt that any part of her nonhealing wounds were related to either arterial or venous issues. They did note that  she had venous reflux at the right at the [facility] and [facility]. And also on the left there were reflux in the deep system at the common femoral vein and greater saphenous vein in the proximal thigh. Nothing amenable to ablation. 2/20; she is making some decent progress on the right where there is nice skin between the 2 open areas on the right ankle. The surfaces here do not look viable yet there is some surrounding epithelialization. She still has a small area on the left medial ankle area. Hyper-granulated Jody's away always 2/28 patient has an appointment with plastic surgery on 3/8. We will see her back on 3/9. She may have to call us to get the area redressed. We've been using Santyl under silver alginate. We made a nice improvement on the left medial ankle. The larger wounds on the right also looks somewhat better in terms of epithelialization although I think they could benefit from an aggressive debridement if plastic surgery would be willing to do that. Perhaps placement of Integra or a cell 07/26/2021: She saw Dr. Claudia Desanctis yesterday. He raised the question as to whether or not this might be pyoderma and wanted to wait until that question was answered by dermatology before proceeding with any sort of operative debridement. We have continue to use Santyl under silver alginate with Kerlix and Coban wraps. Overall, her wounds appear to be continuing to contract and epithelialize, with some granulation tissue present. There continues to be some slough on all wound surfaces. 08/09/2021: She has not been able to get an appointment with dermatology because apparently the offices in [place] do not accept Medicaid. She is looking into whether or not she can be seen at the main [facility] dermatology clinic. This is necessary because plastic surgery is concerned that her wounds might represent pyoderma and they did not want to do any procedure until that was clarified. We have been using  Santyl under silver alginate  with Kerlix and Coban wraps. Today, there was a greater amount of drainage on her dressings with a slight green discoloration and significant odor. Despite this, her wounds continue to contract and epithelialize. There is pale granulation tissue present and actually, on the left medial ankle, the granulation tissue is a bit hypertrophic. 08/16/2021: Last week, I took a culture and this grew back rare methicillin-resistant Staph aureus and rare corynebacterium. The MRSA was sensitive to gentamicin which we began applying topically on an empiric basis. This week, her wounds are a bit smaller and the drainage and odor are less. Her primary care provider is working on assisting the patient with a dermatology evaluation. She has been in silver alginate over the gentamicin that was started last week along with Kerlix and Coban wraps. 08/23/2021: Because she has Medicaid, we have been unable to get her into see any dermatologist in the [place] to rule out pyoderma gangrenosum, which was a requirement from plastic surgery prior to any sort of debridement and grafting. Despite this, however, all of her wounds continue to get smaller. The wound on her left medial ankle is nearly closed. There is no odor from the wounds, although she still accumulates a modest amount of drainage on her dressings. 08/30/2021: The lateral right ankle wound and the medial left ankle wound are a bit smaller today. The medial right ankle wound is about the same size. They are less tender. We have still been unable to get her into dermatology. 09/06/2021: All of the wounds are about the same size today. She continues to endorse minimal pain. I communicated with Dr. Claudia Desanctis in plastic surgery regarding our issues getting a dermatology appointment; he was out of town but indicated that he would look into perhaps performing the biopsy in his office and will have his office contact her. 09/14/2021: The patient has an  appointment in dermatology, but it is not until October. Her wounds are roughly the same; she continues to have very thick purulent-looking drainage on her dressings. 09/20/2021: The left medial wound is nearly closed and just has a bit of accumulated eschar on the surface. The right medial and lateral ankle wounds are perhaps a little bit smaller. They continue to have a very pale surface with accumulation of thin slough. 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 I did not think [employer] was indicated based on this. She is getting topical mupirocin with Prisma silver collagen. 10/04/2021: The patient was not seen in clinic last week due to childcare coverage issues. In the interim, the left medial leg wound has closed. The right sided leg wounds are smaller. There is more granulation tissue coming through, particularly on the lateral wound. The surface remains somewhat gritty. We have been applying topical mupirocin and Prisma silver collagen. 10/11/2021: The left medial leg wound remains closed. She does complain of some anesthetic sensation to the area. Both of the right-sided leg wounds are smaller but still have accumulated slough. 10/18/2021: Both right-sided leg wounds are minimally smaller this week. She still continues to accumulate slough and has thick drainage on her dressings. 10/23/2021: Both wounds continue to contract. There is still slough buildup. She has been approved for a keratin-based skin substitute trial product but it will not be available until next week. 10/30/2021: The wounds are about the same to perhaps slightly smaller. There is still continued slough buildup. Unfortunately, the rep for the keratin based product did not show up today and did not answer his  phone when called. 11/08/2021: The wounds are little bit smaller today. She continues to have thick drainage but the surfaces are relatively clean with just a little bit of slough accumulation. She  reported to me today that she is unable to completely flex her left ankle and on examination it seems this is potentially related to scar tissue from her wounds. We do have the ProgenaMatrix trial product available for her today. 11/15/2021: Both wounds are smaller today. There is some slough accumulation on the surfaces, but the medial wound, in particular looks like it is filling in and is less deep. She did hear from physical therapy and she is going to start working with them on July 11. She is here for her second application of the trial skin substitute, ProgenaMatrix. 11/22/2021: Both wounds continue to contract, the medial more dramatically than the lateral. Both wounds have a layer of slough on the surface, but underneath this, the gritty fibrous tissue has a little bit more of a pink cast to it rather than being as pale as it has been. 11/29/2021: The wounds are roughly the same size this week, perhaps a millimeter or 2 smaller. The medial wound has filled in and is nearly flush with the surrounding skin surface. She continues to have a lot of slough accumulation on both surfaces. 12/06/2021: No significant change to her wounds, but she has a new opening on her dorsal foot, just distal to the right lateral ankle wound. The area on her left medial ankle that reopened looks a little bit larger today. She has quite a bit of pain associated with the new wound. 12/12/2021: Her wounds look about the same but the new opening on her right lateral dorsal foot is a little bit bigger. She continues to have a fair amount of pain with this wound. 12/26/2021: The left medial ankle wound is tiny and superficial. She has 2 areas of crusting on her left lateral ankle, however, that appear to be threatening to open again. Her right medial ankle wound is a little bit smaller today but still continues to accumulate thick rubbery slough. The new dorsal foot wound is exquisitely painful but there is no odor or purulent  drainage. No erythema or induration. The right lateral ankle wound looks about the same today, again with thick rubbery slough. Patient History Information obtained from Patient. Family History Diabetes - Mother, Lung Disease - Mother, No family history of Cancer, Heart Disease, Hereditary Spherocytosis, Hypertension, Kidney Disease, Seizures, Stroke, Thyroid Problems, Tuberculosis. Social History Never smoker, Marital Status - Married, Alcohol Use - Never, Drug Use - No History, Caffeine Use - Daily. Medical History Eyes Denies history of Cataracts, Glaucoma, Optic Neuritis Ear/Nose/Mouth/Throat Denies history of Chronic sinus problems/congestion, Middle ear problems Hematologic/Lymphatic Patient has history of Anemia, Sickle Cell Disease Denies history of Hemophilia, Human Immunodeficiency Virus, Lymphedema Respiratory Denies history of Aspiration, Asthma, Chronic Obstructive Pulmonary Disease (COPD), Pneumothorax, Sleep Apnea, Tuberculosis Cardiovascular Denies history of Angina, Arrhythmia, Congestive Heart Failure, Coronary Artery Disease, Deep Vein Thrombosis, Hypertension, Hypotension, Myocardial Infarction, Peripheral Arterial Disease, Peripheral Venous Disease, Phlebitis, Vasculitis Gastrointestinal Denies history of Cirrhosis , Colitis, Crohnoos, Hepatitis A, Hepatitis B, Hepatitis C Endocrine Denies history of Type I Diabetes, Type II Diabetes Genitourinary Denies history of End Stage Renal Disease Immunological Denies history of Lupus Erythematosus, Raynaudoos, Scleroderma Integumentary (Skin) Denies history of History of Burn Musculoskeletal Denies history of Gout, Rheumatoid Arthritis, Osteoarthritis, Osteomyelitis Neurologic Patient has history of Neuropathy - right foot intermittant Denies history of  Dementia, Quadriplegia, Paraplegia, Seizure Disorder Oncologic Denies history of Received Chemotherapy, Received Radiation Psychiatric Denies history of  Anorexia/bulimia, Confinement Anxiety Hospitalization/Surgery History - c section x2. - left breast lumpectomy. - iandD right ankle with theraskin. Medical A Surgical History Notes nd Constitutional Symptoms (General Health) H/O miscarriage Cardiovascular bradycardia Gastrointestinal cholilithiasis Objective Constitutional No acute distress.. Vitals Time Taken: 10:09 AM, Height: 67 in, Temperature: 98.7 F, Pulse: 85 bpm, Respiratory Rate: 16 breaths/min, Blood Pressure: 130/60 mmHg. Respiratory Normal work of breathing on room air.. General Notes: 12/26/2021: The left medial ankle wound is tiny and superficial. She has 2 areas of crusting on her left lateral ankle, however, that appear to be threatening to open again. Her right medial ankle wound is a little bit smaller today but still continues to accumulate thick rubbery slough. The new dorsal foot wound is exquisitely painful but there is no odor or purulent drainage. No erythema or induration. The right lateral ankle wound looks about the same today, again with thick rubbery slough. Integumentary (Hair, Skin) Wound #17 status is Open. Original cause of wound was Gradually Appeared. The date acquired was: 10/05/2012. The wound has been in treatment 36 weeks. The wound is located on the Right,Lateral Lower Leg. The wound measures 6.5cm length x 4.5cm width x 0.1cm depth; 22.973cm^2 area and 2.297cm^3 volume. There is Fat Layer (Subcutaneous Tissue) exposed. There is no tunneling or undermining noted. There is a medium amount of purulent drainage noted. The wound margin is flat and intact. There is small (1-33%) pink, pale granulation within the wound bed. There is a large (67-100%) amount of necrotic tissue within the wound bed including Adherent Slough. Wound #21 status is Open. Original cause of wound was Gradually Appeared. The date acquired was: 06/26/2021. The wound has been in treatment 26 weeks. The wound is located on the  Right,Medial Ankle. The wound measures 7cm length x 2.9cm width x 0.1cm depth; 15.944cm^2 area and 1.594cm^3 volume. There is Fat Layer (Subcutaneous Tissue) exposed. There is no tunneling or undermining noted. There is a medium amount of purulent drainage noted. The wound margin is distinct with the outline attached to the wound base. There is small (1-33%) pink granulation within the wound bed. There is a large (67-100%) amount of necrotic tissue within the wound bed including Adherent Slough. Wound #22 status is Open. Original cause of wound was Gradually Appeared. The date acquired was: 11/29/2021. The wound has been in treatment 3 weeks. The wound is located on the Left,Medial Ankle. The wound measures 0.3cm length x 0.3cm width x 0.3cm depth; 0.071cm^2 area and 0.021cm^3 volume. There is Fat Layer (Subcutaneous Tissue) exposed. There is no tunneling or undermining noted. There is a medium amount of serosanguineous drainage noted. There is large (67-100%) pink granulation within the wound bed. There is a small (1-33%) amount of necrotic tissue within the wound bed including Adherent Slough. Wound #23 status is Open. Original cause of wound was Gradually Appeared. The date acquired was: 12/06/2021. The wound has been in treatment 2 weeks. The wound is located on the Right,Distal,Lateral Ankle. The wound measures 3.8cm length x 1.2cm width x 0.2cm depth; 3.581cm^2 area and 0.716cm^3 volume. There is Fat Layer (Subcutaneous Tissue) exposed. There is no tunneling or undermining noted. There is a medium amount of serosanguineous drainage noted. There is large (67-100%) pink granulation within the wound bed. There is a small (1-33%) amount of necrotic tissue within the wound bed including Adherent Slough. 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 There was a Excisional Skin/Subcutaneous Tissue Debridement with a total area of 29.25 sq cm performed by Fredirick Maudlin, MD. With the following instrument(s): Curette to remove Non-Viable tissue/material. Material removed includes Subcutaneous Tissue and Slough and after achieving pain control using Lidocaine 5% topical ointment. No specimens were taken. A time out was conducted at 10:41, prior to the start of the procedure. A Minimum amount of bleeding was controlled with Pressure. The procedure was tolerated well with a pain level of 3 throughout and a pain level of 0 following the procedure. Post Debridement Measurements: 6.5cm length x 4.5cm width x 0.1cm depth; 2.297cm^3 volume. Character of Wound/Ulcer Post Debridement is improved. Post procedure Diagnosis Wound #17: Same as Pre-Procedure Pre-procedure diagnosis of Wound #17 is a Sickle Cell Lesion located on the Right,Lateral Lower Leg. A skin graft procedure using a bioengineered skin substitute/cellular or tissue based product was performed by Fredirick Maudlin, MD with the following instrument(s): Nippers and Scissors. Other was applied and secured with Steri-Strips. 4.5 sq cm of product was utilized and 1.5 sq cm was wasted due to not needed. Post Application, adaptic, gauze was applied. A Time Out was conducted at 10:41, prior to the start of the procedure. The procedure was tolerated well with a pain level of 0 throughout and a pain level of 0 following the procedure. Post procedure Diagnosis Wound #17: Same as Pre-Procedure . Wound #21 Pre-procedure diagnosis of Wound #21 is a Sickle Cell Lesion located on the Right,Medial Ankle .Severity of Tissue Pre Debridement is: Fat layer exposed. There was a Excisional Skin/Subcutaneous Tissue Debridement with a  total area of 20.3 sq cm performed by Fredirick Maudlin, MD. With the following instrument(s): Curette to remove Non-Viable tissue/material. Material removed includes Eschar, Subcutaneous Tissue, and Slough after achieving pain control using Lidocaine 5% topical ointment. No specimens were taken. A time out was conducted at 10:41, prior to the start of the procedure. A Minimum amount of bleeding was controlled with Pressure. The procedure was tolerated well with a pain level of 1 throughout and a pain level of 0 following the procedure. Post Debridement Measurements: 7cm length x 2.9cm width x 0.1cm depth; 1.594cm^3 volume. Character of Wound/Ulcer Post Debridement is improved. Severity of Tissue Post Debridement is: Fat layer exposed. Post procedure Diagnosis Wound #21: Same as Pre-Procedure Pre-procedure diagnosis of Wound #21 is a Sickle Cell Lesion located on the Right,Medial Ankle. A skin graft procedure using a bioengineered skin substitute/cellular or tissue based product was performed by Fredirick Maudlin, MD with the following instrument(s): Nippers and Scissors. Other was applied and secured with Steri-Strips. 4 sq cm of product was utilized and 2 sq cm was wasted due to not needed. Post Application, adaptic, gauze was applied. A Time Out was conducted at 10:41, prior to the start of the procedure. The procedure was tolerated well with a pain level of 0 throughout and a pain level of 0 following the procedure. Post procedure Diagnosis Wound #21: Same as Pre-Procedure . Plan Follow-up Appointments: Return Appointment in 1 week. - Dr. Celine Ahr - Room 3 Other: - PROGENA MATRIX #7 Cellular or Tissue Based Products: Other Cellular or Tissue Based Products Orders/Instructions: - PROGENA MATRIX#7 Bathing/ Shower/ Hygiene: May shower with  protection but do not get wound dressing(s) wet. - Can get cast protector bags at [facility] or [facility] Edema Control - Lymphedema / SCD / Other: Elevate legs to  the level of the heart or above for 30 minutes daily and/or when sitting, a frequency of: - throughout the day Avoid standing for long periods of time. Exercise regularly Additional Orders / Instructions: Follow Nutritious Diet The following medication(s) was prescribed: doxycycline hyclate oral 100 mg capsule 1 capsule p.o. twice daily x 10 days starting 12/26/2021 WOUND #17: - Lower Leg Wound Laterality: Right, Lateral Cleanser: Soap and Water 1 x Per Week/30 Days Discharge Instructions: May shower and wash wound with dial antibacterial soap and water prior to dressing change. Cleanser: Wound Cleanser 1 x Per Week/30 Days Discharge Instructions: Cleanse the wound with wound cleanser prior to applying a clean dressing using gauze sponges, not tissue or cotton balls. Peri-Wound Care: Triamcinolone 15 (g) 1 x Per Week/30 Days Discharge Instructions: Use triamcinolone 15 (g) as directed Peri-Wound Care: Sween Lotion (Moisturizing lotion) 1 x Per Week/30 Days Discharge Instructions: Apply moisturizing lotion as directed Topical: Mupirocin Ointment 1 x Per Week/30 Days Discharge Instructions: Apply Mupirocin (Bactroban) as instructed Prim Dressing: PROGENA MATRIX 1 x Per Week/30 Days ary Secondary Dressing: ABD Pad, 5x9 1 x Per Week/30 Days Discharge Instructions: Apply over primary dressing as directed. Secondary Dressing: Zetuvit Plus 4x8 in 1 x Per Week/30 Days Discharge Instructions: Apply over primary dressing as directed. Com pression Wrap: Kerlix Roll 4.5x3.1 (in/yd) 1 x Per Week/30 Days Discharge Instructions: Apply Kerlix and Coban compression as directed. Com pression Wrap: Coban Self-Adherent Wrap 4x5 (in/yd) 1 x Per Week/30 Days Discharge Instructions: Apply over Kerlix as directed. WOUND #21: - Ankle Wound Laterality: Right, Medial Cleanser: Soap and Water 1 x Per Week/30 Days Discharge Instructions: May shower and wash wound with dial antibacterial soap and water prior to  dressing change. Cleanser: Wound Cleanser 1 x Per Week/30 Days Discharge Instructions: Cleanse the wound with wound cleanser prior to applying a clean dressing using gauze sponges, not tissue or cotton balls. Peri-Wound Care: Triamcinolone 15 (g) 1 x Per Week/30 Days Discharge Instructions: Use triamcinolone 15 (g) as directed Peri-Wound Care: Sween Lotion (Moisturizing lotion) 1 x Per Week/30 Days Discharge Instructions: Apply moisturizing lotion as directed Topical: Mupirocin Ointment 1 x Per Week/30 Days Discharge Instructions: Apply Mupirocin (Bactroban) as instructed Prim Dressing: PROGENA MATRIX 1 x Per Week/30 Days ary Secondary Dressing: ABD Pad, 5x9 1 x Per Week/30 Days Discharge Instructions: Apply over primary dressing as directed. Secondary Dressing: Zetuvit Plus 4x8 in 1 x Per Week/30 Days Discharge Instructions: Apply over primary dressing as directed. Com pression Wrap: Kerlix Roll 4.5x3.1 (in/yd) 1 x Per Week/30 Days Discharge Instructions: Apply Kerlix and Coban compression as directed. Com pression Wrap: Coban Self-Adherent Wrap 4x5 (in/yd) 1 x Per Week/30 Days Discharge Instructions: Apply over Kerlix as directed. WOUND #22: - Ankle Wound Laterality: Left, Medial Cleanser: Soap and Water Discharge Instructions: May shower and wash wound with dial antibacterial soap and water prior to dressing change. Topical: Mupirocin Ointment Discharge Instructions: Apply Mupirocin (Bactroban) as instructed Prim Dressing: KerraCel Ag Gelling Fiber Dressing, 2x2 in (silver alginate) ary Discharge Instructions: Apply silver alginate to wound bed as instructed Secondary Dressing: Bordered Gauze, 2x2 in Discharge Instructions: Apply over primary dressing as directed. 12/26/2021: The left medial ankle wound is tiny and superficial. She has 2 areas of crusting on her left lateral ankle, however, that appear to be threatening to open  again. Her right medial ankle wound is a little bit  smaller today but still continues to accumulate thick rubbery slough. The new dorsal foot wound is exquisitely painful but there is no odor or purulent drainage. No erythema or induration. The right lateral ankle wound looks about the same today, again with thick rubbery slough. I used a curette to debride the rubbery slough off of both of the right ankle wounds. I attempted to debride the slough from the dorsal foot wound, but this was exquisitely painful for her and I was unable to continue. I had intended to obtain a culture from the site, as well, but she was unable to tolerate this. 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 We will apply topical mupirocin to the location. I applied the donated skin substitute (ProgenaMatrix) to each of her right lower extremity wounds. It was secured in place with Adaptic and Steri-Strips. Gauze bolsters were created and her leg was wrapped. We will use silver alginate to her left medial ankle wound. We will monitor the left lateral ankle closely for any signs of wound opening. Follow-up in 1 week. Electronic Signature(s) Signed: 12/27/2021 8:14:15 AM By: Fredirick Maudlin MD FACS Previous Signature: 12/26/2021 11:16:53 AM Version By: Fredirick Maudlin MD FACS Entered By: Fredirick Maudlin on 12/27/2021 08:14:15 -------------------------------------------------------------------------------- HxROS Details Patient Name: Date of Service: [name], [name] 12/26/2021 10:15 A M Medical Record Number: [id number] Patient Account Number: [id number] Date of Birth/Sex: Treating RN: 1970-11-18 (51 y.o. [name] Primary Care Provider: [name] Other Clinician: Referring Provider: Treating Provider/Extender: Elyse Jarvis Weeks in Treatment: 36 Information Obtained From Patient Constitutional Symptoms (General Health) Medical History: Past Medical History Notes: H/O  miscarriage Eyes Medical History: Negative for: Cataracts; Glaucoma; Optic Neuritis Ear/Nose/Mouth/Throat Medical History: Negative for: Chronic sinus problems/congestion; Middle ear problems Hematologic/Lymphatic Medical History: Positive for: Anemia; Sickle Cell Disease Negative for: Hemophilia; Human Immunodeficiency Virus; Lymphedema Respiratory Medical History: Negative for: Aspiration; Asthma; Chronic Obstructive Pulmonary Disease (COPD); Pneumothorax; Sleep Apnea; Tuberculosis Cardiovascular Medical History: Negative for: Angina; Arrhythmia; Congestive Heart Failure; Coronary Artery Disease; Deep Vein Thrombosis; Hypertension; Hypotension; Myocardial Infarction; Peripheral Arterial Disease; Peripheral Venous Disease; Phlebitis; Vasculitis Past Medical History Notes: bradycardia Gastrointestinal Medical History: Negative for: Cirrhosis ; Colitis; Crohns; Hepatitis A; Hepatitis B; Hepatitis C Past Medical History Notes: cholilithiasis Endocrine Medical History: Negative for: Type I Diabetes; Type II Diabetes Genitourinary Medical History: Negative for: End Stage Renal Disease Immunological Medical History: Negative for: Lupus Erythematosus; Raynauds; Scleroderma Integumentary (Skin) Medical History: Negative for: History of Burn Musculoskeletal Medical History: Negative for: Gout; Rheumatoid Arthritis; Osteoarthritis; Osteomyelitis Neurologic Medical History: Positive for: Neuropathy - right foot intermittant Negative for: Dementia; Quadriplegia; Paraplegia; Seizure Disorder Oncologic Medical History: Negative for: Received Chemotherapy; Received Radiation Psychiatric Medical History: Negative for: Anorexia/bulimia; Confinement Anxiety Immunizations Pneumococcal Vaccine: Received Pneumococcal Vaccination: No Implantable Devices None Hospitalization / Surgery History Type of Hospitalization/Surgery c section x2 left breast lumpectomy iandD right ankle  with theraskin Family and Social History Cancer: No; Diabetes: Yes - Mother; Heart Disease: No; Hereditary Spherocytosis: No; Hypertension: No; Kidney Disease: No; Lung Disease: Yes - Mother; Seizures: No; Stroke: No; Thyroid Problems: No; Tuberculosis: No; Never smoker; Marital Status - Married; Alcohol Use: Never; Drug Use: No History; Caffeine Use: Daily; Financial Concerns: No; Food, Clothing or Shelter Needs: No; Support System Lacking: No; Transportation Concerns: No Engineer, maintenance) Signed: 12/26/2021 12:21:54 PM By: Fredirick Maudlin MD FACS Signed:  12/27/2021 5:22:54 PM By: Dellie Catholic RN Signed: 12/27/2021 5:22:54 PM By: Dellie Catholic RN Entered By: Fredirick Maudlin on 12/26/2021 11:12:59 -------------------------------------------------------------------------------- SuperBill Details Patient Name: Date of Service: [name], [place] [name] 12/26/2021 Medical Record Number: [id number] Patient Account Number: [id number] Date of Birth/Sex: Treating RN: 05-Jan-1971 (51 y.o. [name] Primary Care Provider: [name] Other Clinician: Referring Provider: Treating Provider/Extender: Elyse Jarvis Weeks in Treatment: 36 Diagnosis Coding ICD-10 Codes Code Description [phone] Non-pressure chronic ulcer of other part of right lower leg with other specified severity L97.828 Non-pressure chronic ulcer of other part of left lower leg with other specified severity 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 Facility Procedures CPT4 Code: [id number] Description: 33007 - SKIN SUB GRAFT TRNK/ARM/LEG ICD-10 Diagnosis Description L97.818 Non-pressure chronic ulcer of other part of right lower leg with other specified Modifier: severity Quantity: 1 CPT4 Code: [id number] Description: 45625 - SKIN SUB GRAFT TRNK/ARM/LEG ICD-10 Diagnosis Description L97.818 Non-pressure chronic ulcer of other part of right lower leg with other specified Modifier: severity Quantity:  1 Physician Procedures : CPT4 Code Description Modifier [id number] - WC PHYS LEVEL 4 - EST PT 25 ICD-10 Diagnosis Description L97.818 Non-pressure chronic ulcer of other part of right lower leg with other specified severity L97.828 Non-pressure chronic ulcer of other part  of left lower leg with other specified severity 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 Quantity: 1 : [id number] - WC PHYS SKIN SUB GRAFT TRNK/ARM/LEG ICD-10 Diagnosis Description L97.818 Non-pressure chronic ulcer of other part of right lower leg with other specified severity Quantity: 1 : [id number] 41638 - WC PHYS SKIN SUB GRAFT TRNK/ARM/LEG ICD-10 Diagnosis Description L97.818 Non-pressure chronic ulcer of other part of right lower leg with other specified severity Quantity: 1 Electronic Signature(s) Signed: 12/26/2021 11:17:50 AM By: Fredirick Maudlin MD FACS Entered By: Fredirick Maudlin on 12/26/2021 11:17:50

## 2021-12-31 ENCOUNTER — Encounter: Payer: Self-pay | Admitting: Physical Therapy

## 2021-12-31 ENCOUNTER — Ambulatory Visit: Payer: Medicaid Other | Admitting: Physical Therapy

## 2021-12-31 DIAGNOSIS — M25572 Pain in left ankle and joints of left foot: Secondary | ICD-10-CM | POA: Diagnosis not present

## 2021-12-31 DIAGNOSIS — M25672 Stiffness of left ankle, not elsewhere classified: Secondary | ICD-10-CM

## 2021-12-31 DIAGNOSIS — R2689 Other abnormalities of gait and mobility: Secondary | ICD-10-CM

## 2021-12-31 DIAGNOSIS — M6281 Muscle weakness (generalized): Secondary | ICD-10-CM

## 2022-01-02 NOTE — Progress Notes (Signed)
Reviewed all laboratory values including hemoglobin fractionation.  Hemoglobin fractionation shows 88% hemoglobin S.  Patient will warrant monthly exchange transfusions going forward.  Please schedule an appointment in the day hospital on next week for labs and simple exchange transfusion.  Also, the patient will need to apply to the [place] assistance program in order to attain this medication.  Consult with the day hospital nurses pertaining to their infusion schedule.  [name]  APRN, MSN, FNP-C [facility] [address] [place], [place] [address] [phone]

## 2022-01-03 ENCOUNTER — Encounter (HOSPITAL_BASED_OUTPATIENT_CLINIC_OR_DEPARTMENT_OTHER): Payer: Medicaid Other | Admitting: General Surgery

## 2022-01-03 ENCOUNTER — Ambulatory Visit: Payer: Medicaid Other

## 2022-01-03 ENCOUNTER — Ambulatory Visit (HOSPITAL_BASED_OUTPATIENT_CLINIC_OR_DEPARTMENT_OTHER): Payer: Medicaid Other | Admitting: General Surgery

## 2022-01-03 ENCOUNTER — Other Ambulatory Visit (HOSPITAL_COMMUNITY): Payer: Self-pay

## 2022-01-03 DIAGNOSIS — M6281 Muscle weakness (generalized): Secondary | ICD-10-CM

## 2022-01-03 DIAGNOSIS — L97818 Non-pressure chronic ulcer of other part of right lower leg with other specified severity: Secondary | ICD-10-CM | POA: Diagnosis not present

## 2022-01-03 DIAGNOSIS — M25572 Pain in left ankle and joints of left foot: Secondary | ICD-10-CM

## 2022-01-03 DIAGNOSIS — M25672 Stiffness of left ankle, not elsewhere classified: Secondary | ICD-10-CM

## 2022-01-03 DIAGNOSIS — R2689 Other abnormalities of gait and mobility: Secondary | ICD-10-CM

## 2022-01-03 NOTE — Therapy (Signed)
OUTPATIENT PHYSICAL THERAPY TREATMENT NOTE   Patient Name: [name] MRN: [id number] DOB:December 05, 1970, 51 y.o., female Today's Date: 01/03/2022  PCP:  [name], FNP  REFERRING PROVIDER: Fredirick Maudlin, MD  END OF SESSION:   PT End of Session - 01/03/22 1408     Visit Number 8    Number of Visits 16    Date for PT Re-Evaluation 01/22/22    Authorization Type [place] - Visit Number 3    Authorization - Number of Visits 4    PT Start Time 1405    Activity Tolerance Patient limited by pain    Behavior During Therapy [facility] for tasks assessed/performed                Past Medical History:  Diagnosis Date   Anemia 03/30/2012   Hx of sickle cell disease   CAP (community acquired pneumonia)    2014   Cholelithiasis 03/30/2012   Chronic wound of extremity 04/01/2012   Elevated LFTs    Leg ulcer ([place]) 10/27/2012   Chronic under care of wound clinic   Multiple open wounds of lower extremity    chronic wounds B/LLE   Sickle cell disease ([place])    Sinus bradycardia by electrocardiogram 04/03/2012   Past Surgical History:  Procedure Laterality Date   ALLOGRAFT APPLICATION Right 9/76/7341   Procedure: SURGICAL PREP FOR GRAFTING RIGHT LOWER EXTREMITY AND APPLICATION OF Jannifer Hick;  Surgeon: Irene Limbo, MD;  Location: [facility];  Service: Plastics;  Laterality: Right;   APPLICATION OF A-CELL OF EXTREMITY Right 10/09/2015   Procedure: APPLICATION OF Jannifer Hick;  Surgeon: Irene Limbo, MD;  Location: [facility];  Service: Plastics;  Laterality: Right;   BREAST SURGERY     left breast cyst aspiration   CESAREAN SECTION     CESAREAN SECTION N/A 01/06/2014   Procedure: CESAREAN SECTION;  Surgeon: Emily Filbert, MD;  Location: [facility];  Service: Obstetrics;  Laterality: N/A;   CESAREAN SECTION N/A 05/04/2017   Procedure: CESAREAN SECTION;  Surgeon: Woodroe Mode, MD;  Location: [facility];   Service: Obstetrics;  Laterality: N/A;   I & D EXTREMITY Right 10/09/2015   Procedure: SURGICAL PREPARATION FOR GRAFTING RIGHT ANKLE AND APPLICATION THERASKIN;  Surgeon: Irene Limbo, MD;  Location: [facility];  Service: Plastics;  Laterality: Right;   SKIN FULL THICKNESS GRAFT Bilateral 06/17/2016   Procedure: SURGICAL PREP FOR GRAFTING, BILATERAL LOWER EXTREMITIES AND APPLICATION OF Jannifer Hick;  Surgeon: Irene Limbo, MD;  Location: [facility];  Service: Plastics;  Laterality: Bilateral;   TONSILLECTOMY     Patient Active Problem List   Diagnosis Date Noted   Protein-calorie malnutrition, severe 04/06/2021   Bilateral leg ulcer ([place]) 03/28/2021   Low hemoglobin 05/01/2020   At risk for seizures due to oxygen toxicity 02/09/2020   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   Venous stasis 01/19/2020   Partial loss of skin graft 01/12/2020   Acute respiratory failure with hypoxia (HCC) 12/15/2019   Bone infarction of distal tibia, 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   Sickle cell anemia with crisis ([place]) 09/03/2018   Other chronic pain    Sickle cell crisis ([place]) 06/26/2018   Sickle cell pain crisis ([place]) 04/07/2018   Avascular necrosis of bone of hip, left ([place]) 12/08/2017   Avascular necrosis of bone of hip, right ([place]) 12/08/2017   Hb-SS disease without crisis ([place]) 11/26/2017   Anemia of chronic disease 11/26/2017   Abnormal quad  screen    Ringworm of body 04/05/2017   Ulcer of right lower extremity ([place])    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[place]) 03/02/2017   History of pre-eclampsia 12/16/2016   Previous cesarean delivery x 2 07/22/2013   Hemochromatosis 11/10/2012   Chronic pain syndrome 95/63/8756   Complicated wound infection 04/01/2012   Leukocytosis 03/30/2012    REFERRING DIAG: Decreased L ankle ROM  THERAPY DIAG: Decreased L ankle ROM   Rationale for Evaluation and Treatment Rehabilitation  PERTINENT HISTORY: Sickle Cell disease.   PRECAUTIONS:  None  SUBJECTIVE: 4/10 L ankle pain today, no changes to reports following lasts session due to chronicity and underlying pathologies   PAIN:  Are you having pain? Yes: NPRS scale: 2/10 Pain location: L ankle Pain description: Pulling sensation Aggravating factors: Walking, squatting, stairs Relieving factors: Sitting, laying down, pain medication.   OBJECTIVE:    DIAGNOSTIC FINDINGS: None   PATIENT SURVEYS:  LEFS 19/80; 12/11/21 17/80   COGNITION:           Overall cognitive status: Within functional limits for tasks assessed                          SENSATION: WFL   POSTURE: No Significant postural limitations   PALPATION: None.    LOWER EXTREMITY ROM:   Active ROM Right AROM eval Left AROM eval Left PROM Left PROM 12/31/2021   Hip flexion WFL Mod limitation   Mod limitation  Hip extension         Hip abduction Min limitation Severe limitation   Severe limitation  Hip adduction WFL Severe limitation   Severe limitation  Hip internal rotation Mod limitation Severe limitation   Severe limitation  Hip external rotation Min limitation Severe limitation   Severe limitation  Knee flexion [facility]   WFL  Knee extension [facility]   WFL  Ankle dorsiflexion NT  -10 (lacking) -8 -7  Ankle plantarflexion NT  50   52  Ankle inversion NT  18   25  Ankle eversion NT  20   19   (Blank rows = not tested)   LOWER EXTREMITY MMT:   MMT Right eval Left eval Left 12/31/2021  Hip flexion 5 3+ 3+  Knee flexion NT  5 5  Knee extension NT  5 5  Ankle dorsiflexion NT  4+ 4+  Ankle plantarflexion NT  4+ 4+  Ankle inversion NT  4 4  Ankle eversion NT  4 4   (Blank rows = not tested)   FUNCTIONAL TESTS:  Timed up and go (TUG): 19 sec TUG: 18.97 sec   GAIT: Distance walked: 55f  Assistive device utilized: Single point cane Level of assistance: Complete Independence Comments: Use of SPC, walking on tippee toes on L LE.    TODAY'S TREATMENT: [facility] Adult PT Treatment:                                                 DATE: 01/03/22 Therapeutic Exercise: Nustep L 8 min Stair negotiation 16 steps ascending step through w/cane and single rail, descending with step to pattern single rail and cane  Step ups L 4 in with airex on top 15x R step ups onto 6 in block from airex to focus on L ankle stability 15x RB A/P tilt 30s  x2, then static stand 30s x2 Tandem and retrowalking on airex beam 5 trips fingertip support Wall squats x15 against ball L SLS on Airex occasional fingertip support 30s x3 Runners step leading L 15x 4 in block Sidestepping on airex beam no UE support 5 trips   [facility] Adult PT Treatment:                                                DATE: 12/31/21 Therapeutic Exercise: Nustep L2 8 min Stair negotiation 16 steps ascending step through w/cane and single rail, descending with step to pattern single rail and cane  Step ups L 4 in with airex on top 15x R step ups onto 6 in block from airex to focus on L ankle stability 15x PF/DF against wall 15x ea. Marching against wall 15/15 LAQs w/adduction 15x2 Wall squats x15  [facility] Adult PT Treatment:                                                DATE: 12/18/21 Therapeutic Exercise: Nustep L2 8 min Stair negotiation 16 steps ascending step through w/cane and single rail, descending with step to pattern single rail and cane  Step ups L 4 in with airex on top 15x R step ups onto 6 in block from airex to focus on L ankle stability 15x PF/DF against wall 15x ea. Marching against wall 15/15 FAQs w/adduction 15x2  [facility] Adult PT Treatment:                                                DATE: 12/14/21 Therapeutic Exercise: Nustep L2 6 min Step ups L 4 in 15x runners step PF/DF against wall 15x Marching against wall 10/10 PF w/ball b/t heels 15x SLS L forefoot on floor R kickstand 30s x2 no UE support Manual Therapy: Subtalar DF and INV/EV mobs  Anterior L tibial translation mobs to increase DF   PATIENT  EDUCATION:  Education details: Educated pt on anatomy and physiology of current symptoms, LEFS questionnaire, diagnosis, prognosis, HEP,  and POC. Person educated: Patient Education method: Explanation, Demonstration, Tactile cues, Verbal cues, and Handouts Education comprehension: verbalized understanding and returned demonstration   HOME EXERCISE PROGRAM: Access Code: [email or web address] URL: [email or web address] Date: 11/27/2021 Prepared by: Rudi Heap   Exercises - Supine Single Leg Ankle Pumps  - 2 x daily - 7 x weekly - 3 sets - 10 reps - Ankle Alphabet in Elevation  - 2 x daily - 7 x weekly - 2 sets - Seated Toe Raise  - 2 x daily - 7 x weekly - 2 sets - 10 reps - Seated Long Arc Quad  - 2 x daily - 7 x weekly - 2 sets - 10 reps - Long Sitting Ankle Eversion with Resistance  - 2 x daily - 7 x weekly - 2 sets - 10 reps - Long Sitting Ankle Dorsiflexion with Anchored Resistance  - 2 x daily - 7 x weekly - 2 sets - 10 reps - Long Sitting Ankle Inversion with Resistance  - 2 x daily - 7 x weekly -  2 sets - 10 reps   ASSESSMENT:   CLINICAL IMPRESSION: Progressed to more challenging tasks including CKC proprioceptive tasks on compliant surfaces, fwd and retrowalking, runner step to challenge SLS position, all activities chosen to increase R ankle stability in setting of compromised intra-articular cartilage.     OBJECTIVE IMPAIRMENTS Abnormal gait, decreased activity tolerance, decreased balance, decreased endurance, decreased knowledge of condition, decreased mobility, difficulty walking, decreased ROM, decreased strength, impaired flexibility, impaired sensation, impaired tone, and pain.    ACTIVITY LIMITATIONS carrying, lifting, bending, standing, squatting, stairs, transfers, dressing, and caring for others   PARTICIPATION LIMITATIONS: meal prep, cleaning, laundry, shopping, and community activity   PERSONAL FACTORS Education, Past/current experiences, Time since onset of  injury/illness/exacerbation, and 1 comorbidity: Sickle cell anemia  are also affecting patient's functional outcome.    REHAB POTENTIAL: Good   CLINICAL DECISION MAKING: Stable/uncomplicated   EVALUATION COMPLEXITY: Low     GOALS: Goals reviewed with patient? No   SHORT TERM GOALS: Target date: 12/25/2021  Pt will be I and compliant with initial HEP. Baseline: Patient able to demo to PT w/o need of cuing Goal status: Met   3.  Pt will increase LEFS score to 28/80 indicating minimal detectable change Baseline: 12/11/21 17/80,  Goal status: 12/31/2021, 29/80    LONG TERM GOALS: Target date: 01/22/2022    Pt will be I and compliant with comprhensiveHEP. Baseline:  Goal status: ongoing as HEP is evolving   2.  Increase LEFS to 56/80 indicating minimal detectable change Baseline: 12/11/21 17/80  Goal status: ongoing per target date   3.  Pt will report <2/10 pain at baseline  Baseline:  Goal status: Ongoing as pain reported as moderate   4.  Pt will improve DF to neutral on L LE. Baseline: 12/11/21 -2d DF PROM noted at 0/90d knee position Goal status: Ongoing   5.  Pt will be able to ascend/ descend stairs with alternating gait patten.  Baseline: 12/11/21 Able to ascend with [facility] and single rail alternating but descends with step to pattern Goal status: Pt is able to perform alternating pattern, but has increased pain.    PLAN: PT FREQUENCY: 2x/week   PT DURATION: 2 weeks   PLANNED INTERVENTIONS: Therapeutic exercises, Therapeutic activity, Neuromuscular re-education, Balance training, Gait training, Patient/Family education, Joint manipulation, Joint mobilization, Stair training, Orthotic/Fit training, Aquatic Therapy, Dry Needling, Electrical stimulation, Cryotherapy, Moist heat, Manual lymph drainage, Compression bandaging, scar mobilization, Taping, Traction, Ultrasound, Manual therapy, and Re-evaluation   PLAN FOR NEXT SESSION: Assess HEP/update PRN, continue to progress  functional mobility, strengthen L LE musculature. Decrease patients pain and help minimize functional deficits.     Lanice Shirts, PT 01/03/2022, 2:45 PM   Check all possible CPT codes: [phone] - PT Re-evaluation, 97110- Therapeutic Exercise, [phone]- Neuro Re-education, [phone] - Gait Training, [phone] - Manual Therapy, 97530 - Therapeutic Activities, and [phone] - Self Care     If treatment provided at initial evaluation, no treatment charged due to lack of authorization.

## 2022-01-04 NOTE — Progress Notes (Signed)
NIVIA, GERVASE ([id number]) Visit Report for 01/03/2022 Arrival Information Details Patient Name: Date of Service: [name] 01/03/2022 11:00 A M Medical Record Number: [id number] Patient Account Number: [id number] Date of Birth/Sex: Treating RN: [birth year] (51 y.o. Sue Lush Primary Care Provider: [name] Other Clinician: Referring Provider: Treating Provider/Extender: Elyse Jarvis Weeks in Treatment: 79 Visit Information History Since Last Visit Added or deleted any medications: No Patient Arrived: [name] Any new allergies or adverse reactions: No Arrival Time: 11:11 Had a fall or experienced change in No Transfer Assistance: None activities of daily living that may affect Patient Identification Verified: Yes risk of falls: Secondary Verification Process Completed: Yes Signs or symptoms of abuse/neglect since last visito No Patient Requires Transmission-Based Precautions: No Hospitalized since last visit: No Patient Has Alerts: No Implantable device outside of the clinic excluding No cellular tissue based products placed in the center since last visit: Has Dressing in Place as Prescribed: Yes Has Compression in Place as Prescribed: Yes Pain Present Now: Yes Electronic Signature(s) Signed: 01/03/2022 4:41:53 PM By: Lorrin Jackson Entered By: Lorrin Jackson on 01/03/2022 11:18:54 -------------------------------------------------------------------------------- Encounter Discharge Information Details Patient Name: Date of Service: [name], [name] 01/03/2022 11:00 A M Medical Record Number: [id number] Patient Account Number: [id number] Date of Birth/Sex: Treating RN: 06/12/1970 (51 y.o. Sue Lush Primary Care Provider: [name] Other Clinician: Referring Provider: Treating Provider/Extender: Elyse Jarvis Weeks in Treatment: 37 Encounter Discharge Information Items Post Procedure Vitals Discharge  Condition: Stable Temperature (F): 98.6 Ambulatory Status: Cane Pulse (bpm): 90 Discharge Destination: Home Respiratory Rate (breaths/min): 18 Transportation: Other Blood Pressure (mmHg): 133/64 Schedule Follow-up Appointment: Yes Clinical Summary of Care: Provided on 01/03/2022 Form Type Recipient Paper Patient Patient Electronic Signature(s) Signed: 01/03/2022 4:41:53 PM By: Lorrin Jackson Entered By: Lorrin Jackson on 01/03/2022 12:22:20 -------------------------------------------------------------------------------- Lower Extremity Assessment Details Patient Name: Date of Service: [name], [name] 01/03/2022 11:00 A M Medical Record Number: [id number] Patient Account Number: [id number] Date of Birth/Sex: Treating RN: 1971-01-03 (51 y.o. Sue Lush Primary Care Provider: [name] Other Clinician: Referring Provider: Treating Provider/Extender: Elyse Jarvis Weeks in Treatment: 37 Edema Assessment Assessed: Shirlyn Goltz: Yes] Patrice Paradise: Yes] Edema: [Left: Ye] [Right: s] Calf Left: Right: Point of Measurement: 33 cm From Medial Instep 28 cm 31.5 cm Ankle Left: Right: Point of Measurement: 10 cm From Medial Instep 17.8 cm 21 cm Vascular Assessment Pulses: Dorsalis Pedis Palpable: [Right:Yes] Electronic Signature(s) Signed: 01/03/2022 4:41:53 PM By: Lorrin Jackson Entered By: Lorrin Jackson on 01/03/2022 11:33:39 -------------------------------------------------------------------------------- Multi Wound Chart Details Patient Name: Date of Service: [name], [name] 01/03/2022 11:00 A M Medical Record Number: [id number] Patient Account Number: [id number] Date of Birth/Sex: Treating RN: May 31, 1970 (51 y.o. [name] Primary Care Provider: [name] Other Clinician: Referring Provider: Treating Provider/Extender: Elyse Jarvis Weeks in Treatment: 37 Vital Signs Height(in): 67 Pulse(bpm):  90 Weight(lbs): Blood Pressure(mmHg): 133/64 Body Mass Index(BMI): Temperature(F): 98.6 Respiratory Rate(breaths/min): 18 Photos: [17:Right, Lateral Lower Leg] [21:Right, Medial Ankle] [22:Left, Medial Ankle] Wound Location: [17:Gradually Appeared] [21:Gradually Appeared] [22:Gradually Appeared] Wounding Event: [17:Sickle Cell Lesion] [21:Sickle Cell Lesion] [22:Lesion] Primary Etiology: [17:N/A] [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] [22:N/A] Secondary Etiology: [17:Anemia, Sickle Cell Disease,] [21:Anemia, Sickle Cell Disease,] [22:Anemia, Sickle Cell Disease,] Comorbid History: [17:Neuropathy 10/05/2012] [21:Neuropathy 06/26/2021] [22:Neuropathy 11/29/2021] Date Acquired: [17:37] [21:27] [22:5] Weeks of Treatment: [17:Open] [21:Open] [22:Open] Wound Status: [17:No] [21:No] [22:No] Wound Recurrence: [17:Yes] [21:No] [22:No] Clustered Wound: [17:7x6x0.1] [21:7.2x3.5x0.1] [22:0x0x0] Measurements L x  W x D (cm) [17:32.987] [21:19.792] [22:0] A (cm) : rea [17:3.299] [21:1.979] [22:0] Volume (cm) : [17:82.40%] [21:31.70%] [22:100.00%] % Reduction in A [17:rea: 82.40%] [21:31.70%] [22:100.00%] % Reduction in Volume: [17:Full Thickness Without Exposed] [21:Full Thickness Without Exposed] [22:Full Thickness Without Exposed] Classification: [17:Support Structures Large] [21:Support Structures Medium] [22:Support Structures N/A] Exudate A mount: [17:Purulent] [21:Purulent] [22:N/A] Exudate Type: [17:yellow, brown, green] [21:yellow, brown, green] [22:N/A] Exudate Color: [17:Distinct, outline attached] [21:Distinct, outline attached] [22:N/A] Wound Margin: [17:Small (1-33%)] [21:Small (1-33%)] [22:N/A] Granulation A mount: [17:Pink, Pale] [21:Pink] [22:N/A] Granulation Quality: [17:Large (67-100%)] [21:Large (67-100%)] [22:N/A] Necrotic A mount: [17:Fat Layer (Subcutaneous Tissue): Yes Fat Layer (Subcutaneous Tissue): Yes N/A] Exposed Structures: [17:Fascia: No Tendon: No Muscle: No Joint: No Bone: No Medium  (34-66%)] [21:Fascia: No Tendon: No Muscle: No Joint: No Bone: No Medium (34-66%)] [22:N/A] Epithelialization: [17:Debridement - Excisional] [21:Debridement - Excisional] [22:N/A] Debridement: Pre-procedure Verification/Time Out 11:39 [21:11:39] [22:N/A] Taken: [17:Lidocaine 4% Topical Solution] [21:Lidocaine 4% Topical Solution] [22:N/A] Pain Control: [17:Subcutaneous, Slough] [21:Subcutaneous, Slough] [22:N/A] Tissue Debrided: [17:Skin/Subcutaneous Tissue] [21:Skin/Subcutaneous Tissue] [22:N/A] Level: [17:42] [21:25.2] [22:N/A] Debridement A (sq cm): [17:rea Curette] [21:Curette] [22:N/A] Instrument: [17:Minimum] [21:Minimum] [22:N/A] Bleeding: [17:Pressure] [21:Pressure] [22:N/A] Hemostasis A chieved: [17:Procedure was tolerated well] [21:Procedure was tolerated well] [22:N/A] Debridement Treatment Response: [17:7x6x0.1] [21:7.2x3.5x0.1] [22:N/A] Post Debridement Measurements L x W x D (cm) [17:3.299] [21:1.979] [22:N/A] Post Debridement Volume: (cm) [17:Cellular or Tissue Based Product] [21:Cellular or Tissue Based Product] [22:N/A] Procedures Performed: [17:Debridement No Photos] [21:Debridement 22 23] [22:N/A] Left, Medial Ankle Right, Distal, Lateral Ankle N/A Wound Location: Gradually Appeared Gradually Appeared N/A Wounding Event: Lesion Sickle Cell Lesion N/A Primary Etiology: N/A N/A N/A Secondary Etiology: Anemia, Sickle Cell Disease, Anemia, Sickle Cell Disease, N/A Comorbid History: Neuropathy Neuropathy 11/29/2021 12/06/2021 N/A Date Acquired: 5 3 N/A Weeks of Treatment: Healed - Epithelialized Open N/A Wound Status: No No N/A Wound Recurrence: No No N/A Clustered Wound: 0x0x0 4x2x0.2 N/A Measurements L x W x D (cm) 0 6.283 N/A A (cm) : rea 0 1.257 N/A Volume (cm) : 100.00% -1234.00% N/A % Reduction in Area: 100.00% -1237.20% N/A % Reduction in Volume: Full Thickness Without Exposed Full Thickness Without Exposed N/A Classification: Support  Structures Support Structures N/A Medium N/A Exudate Amount: N/A Serosanguineous N/A Exudate Type: N/A red, brown N/A Exudate Color: N/A Distinct, outline attached N/A Wound Margin: N/A Large (67-100%) N/A Granulation Amount: N/A Pink N/A Granulation Quality: N/A Small (1-33%) N/A Necrotic Amount: N/A Fat Layer (Subcutaneous Tissue): Yes N/A Exposed Structures: Fascia: No Tendon: No Muscle: No Joint: No Bone: No N/A None N/A Epithelialization: N/A Chemical/Enzymatic/Mechanical - N/A Debridement: Excisional N/A 11:39 N/A Pre-procedure Verification/Time Out Taken: N/A Lidocaine 4% Topical Solution N/A Pain Control: N/A Subcutaneous, Slough N/A Tissue Debrided: N/A N/A N/A Level: N/A N/A N/A Debridement A (sq cm): rea N/A N/A N/A Instrument: N/A None N/A Bleeding: N/A N/A N/A Hemostasis A chieved: N/A Procedure was tolerated well N/A Debridement Treatment Response: N/A 4x2x0.2 N/A Post Debridement Measurements L x W x D (cm) N/A 1.257 N/A Post Debridement Volume: (cm) N/A Debridement N/A Procedures Performed: Treatment Notes Wound #17 (Lower Leg) Wound Laterality: Right, Lateral Cleanser Soap and Water Discharge Instruction: May shower and wash wound with dial antibacterial soap and water prior to dressing change. Wound Cleanser Discharge Instruction: Cleanse the wound with wound cleanser prior to applying a clean dressing using gauze sponges, not tissue or cotton balls. Peri-Wound Care Triamcinolone 15 (g) Discharge Instruction: Use triamcinolone 15 (g) as directed Sween Lotion (Moisturizing lotion) Discharge Instruction: Apply  moisturizing lotion as directed Topical Primary Dressing PROGENA MATRIX Secondary Dressing ABD Pad, 5x9 Discharge Instruction: Apply over primary dressing as directed. Zetuvit Plus 4x8 in Discharge Instruction: Apply over primary dressing as directed. Secured With Compression Wrap Kerlix Roll 4.5x3.1 (in/yd) Discharge  Instruction: Apply Kerlix and Coban compression as directed. Coban Self-Adherent Wrap 4x5 (in/yd) Discharge Instruction: Apply over Kerlix as directed. Compression Stockings Add-Ons Wound #21 (Ankle) Wound Laterality: Right, Medial Cleanser Soap and Water Discharge Instruction: May shower and wash wound with dial antibacterial soap and water prior to dressing change. Wound Cleanser Discharge Instruction: Cleanse the wound with wound cleanser prior to applying a clean dressing using gauze sponges, not tissue or cotton balls. Peri-Wound Care Triamcinolone 15 (g) Discharge Instruction: Use triamcinolone 15 (g) as directed Sween Lotion (Moisturizing lotion) Discharge Instruction: Apply moisturizing lotion as directed Topical Primary Dressing PROGENA MATRIX Secondary Dressing ABD Pad, 5x9 Discharge Instruction: Apply over primary dressing as directed. Zetuvit Plus 4x8 in Discharge Instruction: Apply over primary dressing as directed. Secured With Compression Wrap Kerlix Roll 4.5x3.1 (in/yd) Discharge Instruction: Apply Kerlix and Coban compression as directed. Coban Self-Adherent Wrap 4x5 (in/yd) Discharge Instruction: Apply over Kerlix as directed. Compression Stockings Add-Ons Wound #23 (Ankle) Wound Laterality: Right, Lateral, Distal Cleanser Soap and Water Discharge Instruction: May shower and wash wound with dial antibacterial soap and water prior to dressing change. Wound Cleanser Discharge Instruction: Cleanse the wound with wound cleanser prior to applying a clean dressing using gauze sponges, not tissue or cotton balls. Peri-Wound Care Triamcinolone 15 (g) Discharge Instruction: Use triamcinolone 15 (g) as directed Sween Lotion (Moisturizing lotion) Discharge Instruction: Apply moisturizing lotion as directed Topical Primary Dressing Santyl Ointment Discharge Instruction: Apply nickel thick amount to wound bed as instructed Secondary Dressing ABD Pad,  8x10 Discharge Instruction: Apply over primary dressing as directed. Zetuvit Plus 4x8 in Discharge Instruction: Apply over primary dressing as directed. Secured With Compression Wrap Kerlix Roll 4.5x3.1 (in/yd) Discharge Instruction: Apply Kerlix and Coban compression as directed. Coban Self-Adherent Wrap 4x5 (in/yd) Discharge Instruction: Apply over Kerlix as directed. Compression Stockings Add-Ons Electronic Signature(s) Signed: 01/03/2022 12:35:15 PM By: Fredirick Maudlin MD FACS Signed: 01/04/2022 5:30:22 PM By: Dellie Catholic RN Entered By: Fredirick Maudlin on 01/03/2022 12:35:15 -------------------------------------------------------------------------------- Multi-Disciplinary Care Plan Details Patient Name: Date of Service: [name], [place] [name] 01/03/2022 11:00 A M Medical Record Number: [id number] Patient Account Number: [id number] Date of Birth/Sex: Treating RN: 1971/05/11 (51 y.o. Sue Lush Primary Care Provider: [name] Other Clinician: Referring Provider: Treating Provider/Extender: Elyse Jarvis Weeks in Treatment: 37 Multidisciplinary Care Plan reviewed with physician Active Inactive Venous Leg Ulcer Nursing Diagnoses: Actual venous Insuffiency (use after diagnosis is confirmed) Knowledge deficit related to disease process and management Goals: Patient will maintain optimal edema control Date Initiated: 06/26/2021 Target Resolution Date: 01/16/2022 Goal Status: Active Interventions: Assess peripheral edema status every visit. Compression as ordered Treatment Activities: Therapeutic compression applied : 06/26/2021 Notes: Wound/Skin Impairment Nursing Diagnoses: Impaired tissue integrity Goals: Patient/caregiver will verbalize understanding of skin care regimen Date Initiated: 04/17/2021 Target Resolution Date: 01/16/2022 Goal Status: Active Ulcer/skin breakdown will have a volume reduction of 30% by week 4 Date Initiated:  04/17/2021 Date Inactivated: 05/29/2021 Target Resolution Date: 05/15/2021 Goal Status: Met Ulcer/skin breakdown will have a volume reduction of 50% by week 8 Date Initiated: 05/29/2021 Date Inactivated: 06/26/2021 Target Resolution Date: 06/26/2021 Goal Status: Unmet Unmet Reason: venous reflux Interventions: Assess patient/caregiver ability to obtain necessary supplies Assess patient/caregiver ability to perform ulcer/skin care regimen  upon admission and as needed Assess ulceration(s) every visit Provide education on ulcer and skin care Treatment Activities: Topical wound management initiated : 04/17/2021 Notes: 06/08/21: Left leg wounds greater than 30% volume reduction, right leg acute infection. Electronic Signature(s) Signed: 01/03/2022 4:41:53 PM By: Lorrin Jackson Entered By: Lorrin Jackson on 01/03/2022 11:11:08 -------------------------------------------------------------------------------- Pain Assessment Details Patient Name: Date of Service: [name], [place] [name] 01/03/2022 11:00 A M Medical Record Number: [id number] Patient Account Number: [id number] Date of Birth/Sex: Treating RN: 10-22-1970 (51 y.o. Sue Lush Primary Care Provider: [name] Other Clinician: Referring Provider: Treating Provider/Extender: Elyse Jarvis Weeks in Treatment: 37 Active Problems Location of Pain Severity and Description of Pain Patient Has Paino Yes Site Locations Pain Location: Pain in Ulcers With Dressing Change: Yes Duration of the Pain. Constant / Intermittento Intermittent Rate the pain. Current Pain Level: 6 Character of Pain Describe the Pain: Tender, Throbbing Pain Management and Medication Current Pain Management: Medication: Yes Cold Application: No Rest: Yes Massage: No Activity: No T.E.N.S.: No Heat Application: No Leg drop or elevation: No Is the Current Pain Management Adequate: Adequate How does your wound impact your activities  of daily livingo Sleep: No Bathing: No Appetite: No Relationship With Others: No Bladder Continence: No Emotions: No Bowel Continence: No Work: No Toileting: No Drive: No Dressing: No Hobbies: No Electronic Signature(s) Signed: 01/03/2022 4:41:53 PM By: Lorrin Jackson Entered By: Lorrin Jackson on 01/03/2022 11:19:47 -------------------------------------------------------------------------------- Patient/Caregiver Education Details Patient Name: Date of Service: [name], [name] 8/17/2023andnbsp11:00 A M Medical Record Number: [id number] Patient Account Number: [id number] Date of Birth/Gender: Treating RN: [birth year] (51 y.o. Sue Lush Primary Care Physician: [name] Other Clinician: Referring Physician: Treating Physician/Extender: Elyse Jarvis Weeks in Treatment: 37 Education Assessment Education Provided To: Patient Education Topics Provided Venous: Methods: Explain/Verbal, Printed Responses: State content correctly Wound/Skin Impairment: Methods: Demonstration, Explain/Verbal, Printed Responses: State content correctly Electronic Signature(s) Signed: 01/03/2022 4:41:53 PM By: Lorrin Jackson Entered By: Lorrin Jackson on 01/03/2022 11:11:30 -------------------------------------------------------------------------------- Wound Assessment Details Patient Name: Date of Service: [name], [name] 01/03/2022 11:00 A M Medical Record Number: [id number] Patient Account Number: [id number] Date of Birth/Sex: Treating RN: 07-24-1970 (51 y.o. Sue Lush Primary Care Provider: [name] Other Clinician: Referring Provider: Treating Provider/Extender: Elyse Jarvis Weeks in Treatment: 37 Wound Status Wound Number: 17 Primary Etiology: Sickle Cell Lesion Wound Location: Right, Lateral Lower Leg Wound Status: Open Wounding Event: Gradually Appeared Comorbid History: Anemia, Sickle Cell Disease,  Neuropathy Date Acquired: 10/05/2012 Weeks Of Treatment: 37 Clustered Wound: Yes Photos Wound Measurements Length: (cm) 7 Width: (cm) 6 Depth: (cm) 0.1 Area: (cm) 32.987 Volume: (cm) 3.299 % Reduction in Area: 82.4% % Reduction in Volume: 82.4% Epithelialization: Medium (34-66%) Tunneling: No Undermining: No Wound Description Classification: Full Thickness Without Exposed Support Structures Wound Margin: Distinct, outline attached Exudate Amount: Large Exudate Type: Purulent Exudate Color: yellow, brown, green Foul Odor After Cleansing: No Slough/Fibrino Yes Wound Bed Granulation Amount: Small (1-33%) Exposed Structure Granulation Quality: Pink, Pale Fascia Exposed: No Necrotic Amount: Large (67-100%) Fat Layer (Subcutaneous Tissue) Exposed: Yes Necrotic Quality: Adherent Slough Tendon Exposed: No Muscle Exposed: No Joint Exposed: No Bone Exposed: No Treatment Notes Wound #17 (Lower Leg) Wound Laterality: Right, Lateral Cleanser Soap and Water Discharge Instruction: May shower and wash wound with dial antibacterial soap and water prior to dressing change. Wound Cleanser Discharge Instruction: Cleanse the wound with wound cleanser prior to applying a clean dressing using gauze  sponges, not tissue or cotton balls. Peri-Wound Care Triamcinolone 15 (g) Discharge Instruction: Use triamcinolone 15 (g) as directed Sween Lotion (Moisturizing lotion) Discharge Instruction: Apply moisturizing lotion as directed Topical Primary Dressing PROGENA MATRIX Secondary Dressing ABD Pad, 5x9 Discharge Instruction: Apply over primary dressing as directed. Zetuvit Plus 4x8 in Discharge Instruction: Apply over primary dressing as directed. Secured With Compression Wrap Kerlix Roll 4.5x3.1 (in/yd) Discharge Instruction: Apply Kerlix and Coban compression as directed. Coban Self-Adherent Wrap 4x5 (in/yd) Discharge Instruction: Apply over Kerlix as directed. Compression  Stockings Add-Ons Electronic Signature(s) Signed: 01/03/2022 4:41:53 PM By: Lorrin Jackson Entered By: Lorrin Jackson on 01/03/2022 11:35:02 -------------------------------------------------------------------------------- Wound Assessment Details Patient Name: Date of Service: [name], [place] [name] 01/03/2022 11:00 A M Medical Record Number: [id number] Patient Account Number: [id number] Date of Birth/Sex: Treating RN: 03-08-1971 (51 y.o. Sue Lush Primary Care Provider: [name] Other Clinician: Referring Provider: Treating Provider/Extender: Elyse Jarvis Weeks in Treatment: 37 Wound Status Wound Number: 21 Primary Etiology: Sickle Cell Lesion Wound Location: Right, Medial Ankle Secondary Etiology: Venous Leg Ulcer Wounding Event: Gradually Appeared Wound Status: Open Date Acquired: 06/26/2021 Comorbid History: Anemia, Sickle Cell Disease, Neuropathy Weeks Of Treatment: 27 Clustered Wound: No Photos Wound Measurements Length: (cm) 7.2 Width: (cm) 3.5 Depth: (cm) 0.1 Area: (cm) 19.792 Volume: (cm) 1.979 % Reduction in Area: 31.7% % Reduction in Volume: 31.7% Epithelialization: Medium (34-66%) Tunneling: No Undermining: No Wound Description Classification: Full Thickness Without Exposed Support Structures Wound Margin: Distinct, outline attached Exudate Amount: Medium Exudate Type: Purulent Exudate Color: yellow, brown, green Foul Odor After Cleansing: No Slough/Fibrino Yes Wound Bed Granulation Amount: Small (1-33%) Exposed Structure Granulation Quality: Pink Fascia Exposed: No Necrotic Amount: Large (67-100%) Fat Layer (Subcutaneous Tissue) Exposed: Yes Necrotic Quality: Adherent Slough Tendon Exposed: No Muscle Exposed: No Joint Exposed: No Bone Exposed: No Treatment Notes Wound #21 (Ankle) Wound Laterality: Right, Medial Cleanser Soap and Water Discharge Instruction: May shower and wash wound with dial antibacterial soap  and water prior to dressing change. Wound Cleanser Discharge Instruction: Cleanse the wound with wound cleanser prior to applying a clean dressing using gauze sponges, not tissue or cotton balls. Peri-Wound Care Triamcinolone 15 (g) Discharge Instruction: Use triamcinolone 15 (g) as directed Sween Lotion (Moisturizing lotion) Discharge Instruction: Apply moisturizing lotion as directed Topical Primary Dressing PROGENA MATRIX Secondary Dressing ABD Pad, 5x9 Discharge Instruction: Apply over primary dressing as directed. Zetuvit Plus 4x8 in Discharge Instruction: Apply over primary dressing as directed. Secured With Compression Wrap Kerlix Roll 4.5x3.1 (in/yd) Discharge Instruction: Apply Kerlix and Coban compression as directed. Coban Self-Adherent Wrap 4x5 (in/yd) Discharge Instruction: Apply over Kerlix as directed. Compression Stockings Add-Ons Electronic Signature(s) Signed: 01/03/2022 4:41:53 PM By: Lorrin Jackson Entered By: Lorrin Jackson on 01/03/2022 11:35:34 -------------------------------------------------------------------------------- Wound Assessment Details Patient Name: Date of Service: [name], [name] 01/03/2022 11:00 A M Medical Record Number: [id number] Patient Account Number: [id number] Date of Birth/Sex: Treating RN: 07-24-1970 (51 y.o. Sue Lush Primary Care Provider: [name] Other Clinician: Referring Provider: Treating Provider/Extender: Elyse Jarvis Weeks in Treatment: 37 Wound Status Wound Number: 22 Primary Etiology: Lesion Wound Location: Left, Medial Ankle Wound Status: Open Wounding Event: Gradually Appeared Comorbid History: Anemia, Sickle Cell Disease, Neuropathy Date Acquired: 11/29/2021 Weeks Of Treatment: 5 Clustered Wound: No Photos Wound Measurements Length: (cm) Width: (cm) Depth: (cm) Area: (cm) Volume: (cm) 0 % Reduction in Area: 100% 0 % Reduction in Volume: 100% 0 0 0 Wound  Description Classification: Full Thickness  Without Exposed Support Structur es Electronic Signature(s) Signed: 01/03/2022 4:41:53 PM By: Lorrin Jackson Entered By: Lorrin Jackson on 01/03/2022 11:36:45 -------------------------------------------------------------------------------- Wound Assessment Details Patient Name: Date of Service: [name],  [name] 01/03/2022 11:00 A M Medical Record Number: [id number] Patient Account Number: [id number] Date of Birth/Sex: Treating RN: 1970/11/14 (51 y.o. Sue Lush Primary Care Provider: [name] Other Clinician: Referring Provider: Treating Provider/Extender: Elyse Jarvis Weeks in Treatment: 37 Wound Status Wound Number: 22 Primary Etiology: Lesion Wound Location: Left, Medial Ankle Wound Status: Healed - Epithelialized Wounding Event: Gradually Appeared Comorbid History: Anemia, Sickle Cell Disease, Neuropathy Date Acquired: 11/29/2021 Weeks Of Treatment: 5 Clustered Wound: No Wound Measurements Length: (cm) Width: (cm) Depth: (cm) Area: (cm) Volume: (cm) 0 % Reduction in Area: 100% 0 % Reduction in Volume: 100% 0 0 0 Wound Description Classification: Full Thickness Without Exposed Support Structur es Electronic Signature(s) Signed: 01/03/2022 4:41:53 PM By: Lorrin Jackson Entered By: Lorrin Jackson on 01/03/2022 11:41:27 -------------------------------------------------------------------------------- Wound Assessment Details Patient Name: Date of Service: [name], [name] 01/03/2022 11:00 A M Medical Record Number: [id number] Patient Account Number: [id number] Date of Birth/Sex: Treating RN: [birth year] (51 y.o. Sue Lush Primary Care Provider: [name] Other Clinician: Referring Provider: Treating Provider/Extender: Elyse Jarvis Weeks in Treatment: 37 Wound Status Wound Number: [id number] Primary Etiology: Sickle Cell Lesion Wound Location: Right,  Distal, Lateral Ankle Wound Status: Open Wounding Event: Gradually Appeared Comorbid History: Anemia, Sickle Cell Disease, Neuropathy Date Acquired: 12/06/2021 Weeks Of Treatment: 3 Clustered Wound: No Photos Wound Measurements Length: (cm) 4 Width: (cm) 2 Depth: (cm) 0.2 Area: (cm) 6.283 Volume: (cm) 1.257 % Reduction in Area: -1234% % Reduction in Volume: -1237.2% Epithelialization: None Tunneling: No Undermining: No Wound Description Classification: Full Thickness Without Exposed Support Structures Wound Margin: Distinct, outline attached Exudate Amount: Medium Exudate Type: Serosanguineous Exudate Color: red, brown Foul Odor After Cleansing: No Slough/Fibrino Yes Wound Bed Granulation Amount: Large (67-100%) Exposed Structure Granulation Quality: Pink Fascia Exposed: No Necrotic Amount: Small (1-33%) Fat Layer (Subcutaneous Tissue) Exposed: Yes Necrotic Quality: Adherent Slough Tendon Exposed: No Muscle Exposed: No Joint Exposed: No Bone Exposed: No Treatment Notes Wound #23 (Ankle) Wound Laterality: Right, Lateral, Distal Cleanser Soap and Water Discharge Instruction: May shower and wash wound with dial antibacterial soap and water prior to dressing change. Wound Cleanser Discharge Instruction: Cleanse the wound with wound cleanser prior to applying a clean dressing using gauze sponges, not tissue or cotton balls. Peri-Wound Care Triamcinolone 15 (g) Discharge Instruction: Use triamcinolone 15 (g) as directed Sween Lotion (Moisturizing lotion) Discharge Instruction: Apply moisturizing lotion as directed Topical Primary Dressing Santyl Ointment Discharge Instruction: Apply nickel thick amount to wound bed as instructed Secondary Dressing ABD Pad, 8x10 Discharge Instruction: Apply over primary dressing as directed. Zetuvit Plus 4x8 in Discharge Instruction: Apply over primary dressing as directed. Secured With Compression Wrap Kerlix Roll 4.5x3.1  (in/yd) Discharge Instruction: Apply Kerlix and Coban compression as directed. Coban Self-Adherent Wrap 4x5 (in/yd) Discharge Instruction: Apply over Kerlix as directed. Compression Stockings Add-Ons Electronic Signature(s) Signed: 01/03/2022 4:41:53 PM By: Lorrin Jackson Entered By: Lorrin Jackson on 01/03/2022 11:36:15 -------------------------------------------------------------------------------- Vitals Details Patient Name: Date of Service: [name], [name] 01/03/2022 11:00 A M Medical Record Number: [id number] Patient Account Number: [id number] Date of Birth/Sex: Treating RN: Nov 28, 1970 (51 y.o. Sue Lush Primary Care Provider: [name] Other Clinician: Referring Provider: Treating Provider/Extender: Elyse Jarvis Weeks in Treatment: 37 Vital Signs Time Taken: 11:18  Temperature (F): 98.6 Height (in): 67 Pulse (bpm): 90 Respiratory Rate (breaths/min): 18 Blood Pressure (mmHg): 133/64 Reference Range: 80 - 120 mg / dl Electronic Signature(s) Signed: 01/03/2022 4:41:53 PM By: Lorrin Jackson Entered By: Lorrin Jackson on 01/03/2022 11:19:21

## 2022-01-04 NOTE — Progress Notes (Signed)
[name] ([id number]) Visit Report for 01/03/2022 Chief Complaint Document Details Patient Name: Date of Service: [name] [name] 01/03/2022 11:00 A M Medical Record Number: [id number] Patient Account Number: [id number] Date of Birth/Sex: Treating RN: Dec 01, 1970 (51 y.o. [name] Primary Care Provider: [name] Other Clinician: Referring Provider: Treating Provider/Extender: Elyse Jarvis Weeks in Treatment: 37 Information Obtained from: Patient Chief Complaint the patient is here for evaluation of her bilateral lower extremity sickle cell ulcers 04/17/2021; patient comes in for substantial wounds on the right and left lower leg Electronic Signature(s) Signed: 01/03/2022 12:35:24 PM By: Fredirick Maudlin MD FACS Entered By: Fredirick Maudlin on 01/03/2022 12:35:24 -------------------------------------------------------------------------------- Cellular or Tissue Based Product Details Patient Name: Date of Service: [name] [name], [name] 01/03/2022 11:00 A M Medical Record Number: [id number] Patient Account Number: [id number] Date of Birth/Sex: Treating RN: Dec 22, 1970 (51 y.o. [name] Primary Care Provider: [name] Other Clinician: Referring Provider: Treating Provider/Extender: Elyse Jarvis Weeks in Treatment: 37 Cellular or Tissue Based Product Type Wound #17 Right,Lateral Lower Leg Applied to: Performed By: Physician Fredirick Maudlin, MD Cellular or Tissue Based Product Type: Other Level of Consciousness (Pre-procedure): Awake and Alert Pre-procedure Verification/Time Out Yes - 11:40 Taken: Location: trunk / arms / legs Wound Size (sq cm): 42 Product Size (sq cm): 36 Waste Size (sq cm): 0 Amount of Product Applied (sq cm): 36 Instrument Used: Blade, Forceps, Scissors Lot #: [id number] Expiration Date: 03/23/2024 Fenestrated: Yes Instrument: Blade Reconstituted: No Secured: Yes Secured  With: Steri-Strips Dressing Applied: Yes Primary Dressing: Adaptic Response to Treatment: Procedure was tolerated well Level of Consciousness (Post- Awake and Alert procedure): Post Procedure Diagnosis Same as Pre-procedure Notes [employer] forces logistics/support/administrative officer) Signed: 01/03/2022 12:42:48 PM By: Fredirick Maudlin MD FACS Signed: 01/03/2022 4:41:53 PM By: Lorrin Jackson Entered By: Lorrin Jackson on 01/03/2022 11:54:30 -------------------------------------------------------------------------------- Cellular or Tissue Based Product Details Patient Name: Date of Service: [name] [name], [name] 01/03/2022 11:00 A M Medical Record Number: [id number] Patient Account Number: [id number] Date of Birth/Sex: Treating RN: [birth year] (51 y.o. [name] Primary Care Provider: [name] Other Clinician: Referring Provider: Treating Provider/Extender: Elyse Jarvis Weeks in Treatment: 37 Cellular or Tissue Based Product Type Wound #21 Right,Medial Ankle Applied to: Performed By: Physician Fredirick Maudlin, MD Cellular or Tissue Based Product Type: Other Level of Consciousness (Pre-procedure): Awake and Alert Pre-procedure Verification/Time Out Yes - 11:40 Taken: Location: trunk / arms / legs Wound Size (sq cm): 25.2 Product Size (sq cm): 36 Waste Size (sq cm): 0 Amount of Product Applied (sq cm): 36 Instrument Used: Blade, Forceps, Scissors Lot #: [id number] Expiration Date: 03/23/2024 Fenestrated: Yes Instrument: Blade Reconstituted: No Secured: Yes Secured With: Steri-Strips Dressing Applied: Yes Primary Dressing: Adaptic Response to Treatment: Procedure was tolerated well Level of Consciousness (Post- Awake and Alert procedure): Post Procedure Diagnosis Same as Pre-procedure 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 [name] Primary Care Provider: [name] Other Clinician: Referring Provider: Treating Provider/Extender: Elyse Jarvis Weeks in Treatment: 37 Debridement Performed for Assessment: Wound #17 Right,Lateral Lower Leg Performed By: Physician Fredirick Maudlin, MD Debridement Type: Debridement Level of Consciousness (  Pre-procedure): Awake and Alert Pre-procedure Verification/Time Out Yes - 11:39 Taken: Start Time: 11:40 Pain Control: Lidocaine 4% T opical Solution T Area Debrided (L x W): otal 7 (cm) x 6 (cm) = 42 (cm) Tissue and other material debrided: Non-Viable, Slough, Subcutaneous, Slough Level: Skin/Subcutaneous Tissue Debridement Description: Excisional Instrument: Curette Bleeding: Minimum Hemostasis Achieved: Pressure End Time: 11:44 Response to Treatment: Procedure was tolerated well Level of Consciousness (Post- Awake and Alert procedure): Post Debridement Measurements of Total Wound Length: (cm) 7 Width: (cm) 6 Depth: (cm) 0.1 Volume: (cm) 3.299 Character of Wound/Ulcer Post Debridement: Stable Post Procedure Diagnosis Same as Pre-procedure Electronic Signature(s) Signed: 01/03/2022 12:42:48 PM By: Fredirick Maudlin MD FACS Signed: 01/03/2022 4:41:53 PM By: Lorrin Jackson Entered By: Lorrin Jackson on 01/03/2022 11:46:27 -------------------------------------------------------------------------------- Debridement Details Patient Name: Date of Service: [name] [name], [name] 01/03/2022 11:00 A M Medical Record Number: [id number] Patient Account Number: [id number] Date of  Birth/Sex: Treating RN: 02/19/1971 (51 y.o. [name] Primary Care Provider: [name] Other Clinician: Referring Provider: Treating Provider/Extender: Elyse Jarvis Weeks in Treatment: 37 Debridement Performed for Assessment: Wound #21 Right,Medial Ankle Performed By: Physician Fredirick Maudlin, MD Debridement Type: Debridement Severity of Tissue Pre Debridement: Fat layer exposed Level of Consciousness (Pre-procedure): Awake and Alert Pre-procedure Verification/Time Out Yes - 11:39 Taken: Start Time: 11:46 Pain Control: Lidocaine 4% T opical Solution T Area Debrided (L x W): otal 7.2 (cm) x 3.5 (cm) = 25.2 (cm) Tissue and other material debrided: Non-Viable, Slough, Subcutaneous, Slough Level: Skin/Subcutaneous Tissue Debridement Description: Excisional Instrument: Curette Bleeding: Minimum Hemostasis Achieved: Pressure End Time: 11:48 Response to Treatment: Procedure was tolerated well Level of Consciousness (Post- Awake and Alert procedure): Post Debridement Measurements of Total Wound Length: (cm) 7.2 Width: (cm) 3.5 Depth: (cm) 0.1 Volume: (cm) 1.979 Character of Wound/Ulcer Post Debridement: Stable Severity of Tissue Post Debridement: Fat layer exposed Post Procedure Diagnosis Same as Pre-procedure Electronic Signature(s) Signed: 01/03/2022 12:42:48 PM By: Fredirick Maudlin MD FACS Signed: 01/03/2022 4:41:53 PM By: Lorrin Jackson Entered By: Lorrin Jackson on 01/03/2022 11:48:39 -------------------------------------------------------------------------------- Debridement Details Patient Name: Date of Service: [name] [name], [name] 01/03/2022 11:00 A M Medical Record Number: [id number] Patient Account Number: [id number] Date of Birth/Sex: Treating RN: 1970/07/04 (51 y.o. [name] Primary Care Provider: [name] Other Clinician: Referring Provider: Treating Provider/Extender: Elyse Jarvis Weeks  in Treatment: 37 Debridement Performed for Assessment: Wound #23 Right,Distal,Lateral Ankle Performed By: Physician Fredirick Maudlin, MD Debridement Type: Chemical/Enzymatic/Mechanical Agent Used: Santyl Level of Consciousness (Pre-procedure): Awake and Alert Pre-procedure Verification/Time Out Yes - 11:39 Taken: Start Time: 11:46 Pain Control: Lidocaine 4% Topical Solution Bleeding: None End Time: 11:48 Response to Treatment: Procedure was tolerated well Level of Consciousness (Post- Awake and Alert procedure): Post Debridement Measurements of Total Wound Length: (cm) 4 Width: (cm) 2 Depth: (cm) 0.2 Volume: (cm) 1.257 Character of Wound/Ulcer Post Debridement: Stable Post Procedure Diagnosis Same as Pre-procedure Electronic Signature(s) Signed: 01/03/2022 12:42:48 PM By: Fredirick Maudlin MD FACS Signed: 01/03/2022 4:41:53 PM By: Lorrin Jackson Entered By: Lorrin Jackson on 01/03/2022 12:03:31 -------------------------------------------------------------------------------- HPI Details Patient Name: Date of Service: [name] [name], [name] 01/03/2022 11:00 A M Medical Record Number: [id number] Patient Account Number: [id number] Date of Birth/Sex: Treating RN: 1970/06/21 (51 y.o. [name] Primary Care Provider: [name] Other Clinician: Referring Provider: Treating Provider/Extender: Elyse Jarvis Weeks in Treatment: 37 History of Present Illness Location: medial and lateral ankle region on the right and left medial malleolus  Quality: Patient reports experiencing a shooting pain to affected area(s). Severity: Patient states wound(s) are getting worse. Duration: right lower extremity bimalleolar ulcers have been present for approximately 2 years; the rright meedial malleolus ulcer has been there proximally 6 months Timing: Pain in wound is constant (hurts all the time) Context: The wound would happen gradually ssociated Signs and Symptoms:  Patient reports having increase discharge. A HPI Description: 51 year old patient with a history of sickle cell anemia who was last seen by me with ulceration of the right lower extremity above the ankle and was referred to Dr. Leland Johns for a surgical debridement as I was unable to do anything in the office due to excruciating pain. At that stage she was referred from the plastic surgery service to dermatology who treated her for a skin infection with doxycycline and then Levaquin and a local antibiotic ointment. I understand the patient has since developed ulceration on the left ankle both medial and lateral and was now referred back to the wound center as dermatology has finished the management. I do not have any notes from the dermatology department Old notes: 51 year old patient with a history of sickle cell anemia, pain bilateral lower extremities, right lower extremity ulcer and has a history of receiving a skin graft( Theraskin) several months ago. She has been visiting the wound center [facility] and was seen by Dr. Dellia Nims and Dr. Leland Johns. after prolonged conservator therapy between July 2016 and January 2017. She had been seen by the plastic surgeon and taken to the OR for debridement and application of Theraskin. She had 3 applications of Theraskin and was then treated with collagen. Prior to that she had a history of similar problems in 2014 and was treated conservatively. Had a reflux study done for the right lower extremity in August 2016 without reflux or DVT . Past medical history significant for sickle cell disease, anemia, leg ulcers, cholelithiasis,and has never been a smoker. Once the patient was discharged on the wound center she says within 2 or 3 weeks the problems recurred and she has been treating it conservatively. since I saw her 3 weeks ago at [facility] she has been unable to get her dressing material but has completed a course of doxycycline. 6/7/ 2017 -- lower  extremity venous duplex reflux evaluation was done No evidence of SVT or DVT in the RLL. No venous incompetence in the RLL. No further vascular workup is indicated at this time. She was seen by Dr. [name], on 10/04/2015. She agreed with the plan of taking her to the OR for debridement and application of theraskin and would also take biopsies to rule out pyoderma gangrenosum. Follow-up note dated May 31 received and she was status post application of Theraskin to multiple ulcers around the right ankle. Pathology did not show evidence of malignancy or pyoderma gangrenosum. She would continue to see as in the wound clinic for further care and see Dr. Leland Johns as needed. The patient brought the biopsy report and it was consistent with stasis ulcer no evidence of malignancy and the comment was that there was some adjacent neovascularization, fibrosis and patchy perivascular chronic inflammation. 11/15/2015 -- today we applied her first application of Theraskin 11/30/15; TheraSkin #2 12/13/2015 -- she is having a lot of pain locally and is here for possible application of a theraskin today. 01/16/2016 -- the patient has significant pain and has noticed despite in spite of all local care and oral pain medication. It is impossible to debride her in the  office. 02/06/2016 -- I do not see any notes from Dr. Iran Planas( the patient has not made a call to the office know as she heard from them) and the only visit to recently was with her PCP Dr. Danella Penton -- I saw her on 01/16/2016 and prescribed 90 tablets of oxycodone 10 mg and did lab work and screening for HIV. the HIV was negative and hemoglobin was 6.3 with a WBC count of 14.9 and hematocrit of 17.8 with platelets of 561. 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 The bimalleolar ulcers to the right lower extremity have been  present for approximately 2 years, the left medial malleolus ulcer has been present approximately 6 months. She has followed with Dr.Thimmappa in the past and has had a total of 3 applications of Theraskin (01/2015, 09/2015, 06/17/16). She has also followed with Dr. Con Memos here in the clinic and has received 2 applications of TheraSkin (11/10/15, 11/30/15). The patient does experience chronic, and is not amenable to debridement. 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 She is not currently on any antibiotic therapy and has not been treated with any recently. 07/17/2016 -- was seen by Dr. Iran Planas of plastic surgery who saw her 2 weeks postop application of Theraskin #3. She had removed her dressing and asked her to apply silver alginate on alternate days and follow-up back with the wound center. Future debridements and application of skin substitute would have to be done in the hospital due to her high risk for anesthesia. READMISSION 04/17/2021 Patient is now a 51 year old woman that we have had in this clinic for a prolonged period of time and 2016-2017 and then again for 2 visits in February 2018. At that point she had wounds on the right lower leg predominantly medial. She had also been seen by plastic surgery Dr. Leland Johns who I believe took her to the OR for operative debridement and application of TheraSkin in 2017. After she left our clinic she was followed for a very prolonged period of time in the wound care center in [facility] who then referred her ultimately to [facility] where she was seen by Dr. Vernona Rieger. Again taken her to the OR for skin grafting which apparently did not take. She had multiple other attempts at dressings although I have not really looked over all of these notes in great detail. She has not been seen in a wound care center in about a year. She states over the last year in addition to her right lower leg she has developed wounds on the  left lower leg quite extensive. She is using Xeroform to all of these wounds without really any improvement. She also has Medicaid which does not cover wound products. The patient has had vascular work-ups in the past including most recently on 03/28/2021 showing biphasic waveforms on the right triphasic at the PTA and biphasic at the dorsalis pedis on the left. She was unable to tolerate any degree of compression to do ABIs. Unfortunately TBI's were also not done. She had venous reflux studies done in 2017. This did not show any evidence of a DVT or SVT and no venous incompetence was noted in the right leg at the time this was the only side with the wound As noted I did not look all over her old records. She apparently had a course of HBO and [facility]  although I am not sure what the indication would have been. In any case she developed seizures and terminated treatment earlier. She is generally much more disabled than when we last saw her in clinic. She can no longer walk pretty much wheelchair-bound because predominantly of pain in the left hip. 04/24/2021; the patient tolerated the wraps we put on. We used Santyl and Hydrofera Blue under compression. I brought her back for a nurse visit for a change in dressing. With Medicaid we will have a hard time getting anything paid for and hence the need for compression. She arrives in clinic with all the wounds looking somewhat better in terms of surface 12/20; circumferential wound on the right from the lateral to the medial. She has open areas on the left medial and left lateral x2 on all of this with the same surface. This does not look completely healthy although she does have some epithelialization. She is not complaining of a lot of pain which is unusual for her sickle ulcers. I have not looked over her extensive records from [facility]. She had recent arterial studies and has a history of venous reflux studies I will need to look these over although I do  not believe she has significant arterial disease 2023 05/22/2021; patient's wound areas measure slightly smaller. Still a lot of drainage coming from the right we have been using Hydrofera Blue and Santyl with some improvement in the wound surfaces. She tells me she will be getting transfused later in the week for her underlying sickle cell anemia I have looked over her recent arterial studies which were done in the fall. This was in November and showed biphasic and triphasic waveforms but she could not tolerate ABIs because of pressure and unfortunately TBI's were not done. She has not had recent venous reflux studies that I can see 1/10; not much change about the same surface area. This has a yellowish surface to it very gritty. We have been using Santyl and Hydrofera Blue for a prolonged period. Culture I did last week showed methicillin sensitive staph aureus "rare". Our intake nurse reports greenish drainage which may be the Hydrofera Blue itself 1/17; wounds are continue to measure smaller although I am not sure about the accuracy here. Especially the areas on the right are covered in what looks to be a nonviable surface although she does have some epithelialization. Similarly she has areas on the left medial and left lateral ankle area which appear to have a better surface and perhaps are slightly smaller. We have been using Santyl and Hydrofera Blue. She cannot tolerate mechanical debridements She went for her reflux studies which showed significant reflux at the greater saphenous vein at the saphenofemoral junction as well as the greater saphenous vein in the proximal calf on the left she had reflux in the thigh and the common femoral vein and supra vein Fishel vein reflux in the greater saphenous vein. I will have vein and vascular look at this. My thoughts have been that these are likely sickle wounds. I looked through her old records from [facility] wound care center and then when she  graduated to [employer] wound care center where she saw Dr. Zigmund Daniel and Dr. Vernona Rieger. Although I can see she had reflux studies done I do not see that she actually saw a vein and vascular. I went over the fact that she had operative debridements and actual skin grafting that did not take. I do not think these wounds have ever  really progressed towards healing 1/31;Substantial wounds on the right ankle area. Hyper granulated very gritty adherent debris on the surface. 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 On the left she has no DVT or SVT deep vein reflux in the common femoral vein. Superficial vein reflux in the greater saphenous vein on although the vein diameter is not really all that large. I do not think there is anything that can be done with these although I am going to send her for consultation to vein and vascular. 2/7; Wound exam; substantial wound area on the right posterior ankle area and areas on the left medial ankle and left lateral ankle. I was able to debride the left medial ankle last week fairly aggressively and it is back this week to a completely nonviable surface She will see vascular surgery this Friday and I would like them to review the venous studies and also any comments on her arterial status. If they do not see an issue here I am going to refer her to plastic surgery for an operative debridement perhaps intraoperative ACell or Integra. Eventually she will require a deep tissue culture again 2/14; substantial wound area on the right posterior ankle, medial ankle. We have been using silver alginate The patient  was seen by vein and vascular she had both venous reflux studies and arterial studies. 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 She had no evidence of superficial venous thrombosis. She did have superficial vein reflux at the saphenofemoral junction and the greater saphenous vein. On the left no evidence of a DVT no evidence of superficial venous throat thrombosis she did have deep vein reflux in the common femoral vein and superficial vein reflux in the greater saphenous vein but these were not felt to be amenable to ablation. In terms of arterial studies she had triphasic and wife biphasic wave waveforms bilaterally not felt to have a significant arterial issue. I do not get the feeling that they felt that any part of her nonhealing wounds were related to either arterial or venous issues. They did note that she had venous reflux at the right at the [facility] and [facility]. And also on the left there were reflux in the deep system at the common femoral vein and greater saphenous vein in the proximal thigh. Nothing amenable to ablation. 2/20; she is making some decent progress on the right where there is nice skin between the 2 open areas on the right ankle. The surfaces here do not look viable yet there is some surrounding epithelialization. She still has a small area on the left medial ankle area. Hyper-granulated Jody's away always 2/28 patient has an appointment with plastic surgery on 3/8. We will see her back on 3/9. She may have to call us to get the area redressed. We've been using Santyl under silver alginate. We made a nice improvement on the left medial ankle. The larger wounds on the right also looks somewhat better in terms of epithelialization although I think they could benefit from an aggressive debridement if  plastic surgery would be willing to do that. Perhaps placement of Integra or a cell 07/26/2021: She saw Dr. Claudia Desanctis  yesterday. He raised the question as to whether or not this might be pyoderma and wanted to wait until that question was answered by dermatology before proceeding with any sort of operative debridement. We have continue to use Santyl under silver alginate with Kerlix and Coban wraps. Overall, her wounds appear to be continuing to contract and epithelialize, with some granulation tissue present. There continues to be some slough on all wound surfaces. 08/09/2021: She has not been able to get an appointment with dermatology because apparently the offices in [place] do not accept Medicaid. She is looking into whether or not she can be seen at the main [facility] [facility]. This is necessary because plastic surgery is concerned that her wounds might represent pyoderma and they did not want to do any procedure until that was clarified. We have been using Santyl under silver alginate with Kerlix and Coban wraps. Today, there was a greater amount of drainage on her dressings with a slight green discoloration and significant odor. Despite this, her wounds continue to contract and epithelialize. There is pale granulation tissue present and actually, on the left medial ankle, the granulation tissue is a bit hypertrophic. 08/16/2021: Last week, I took a culture and this grew back rare methicillin-resistant Staph aureus and rare corynebacterium. The MRSA was sensitive to gentamicin which we began applying topically on an empiric basis. This week, her wounds are a bit smaller and the drainage and odor are less. Her primary care provider is working on assisting the patient with a dermatology evaluation. She has been in silver alginate over the gentamicin that was started last week along with Kerlix and Coban wraps. 08/23/2021: Because she has Medicaid, we have been unable to get her into see any dermatologist in the [place] to rule out pyoderma gangrenosum, which was a requirement from  plastic surgery prior to any sort of debridement and grafting. Despite this, however, all of her wounds continue to get smaller. The wound on her left medial ankle is nearly closed. There is no odor from the wounds, although she still accumulates a modest amount of drainage on her dressings. 08/30/2021: The lateral right ankle wound and the medial left ankle wound are a bit smaller today. The medial right ankle wound is about the same size. They are less tender. We have still been unable to get her into dermatology. 09/06/2021: All of the wounds are about the same size today. She continues to endorse minimal pain. I communicated with Dr. Claudia Desanctis in plastic surgery regarding our issues getting a dermatology appointment; he was out of town but indicated that he would look into perhaps performing the biopsy in his office and will have his office contact her. 09/14/2021: The patient has an appointment in dermatology, but it is not until October. Her wounds are roughly the same; she continues to have very thick purulent-looking drainage on her dressings. 09/20/2021: The left medial wound is nearly closed and just has a bit of accumulated eschar on the surface. The right medial and lateral ankle wounds are perhaps a little bit smaller. They continue to have a very pale surface with accumulation of thin slough. 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 I did not think [employer] was indicated based on this. She is getting topical mupirocin with Prisma silver collagen. 10/04/2021: The patient was not seen  in clinic last week due to childcare coverage issues. In the interim, the left medial leg wound has closed. The right sided leg wounds are smaller. There is more granulation tissue coming through, particularly on the lateral wound. The surface remains somewhat gritty. We have been applying topical mupirocin and Prisma silver collagen. 10/11/2021: The left medial leg wound remains closed. She  does complain of some anesthetic sensation to the area. Both of the right-sided leg wounds are smaller but still have accumulated slough. 10/18/2021: Both right-sided leg wounds are minimally smaller this week. She still continues to accumulate slough and has thick drainage on her dressings. 10/23/2021: Both wounds continue to contract. There is still slough buildup. She has been approved for a keratin-based skin substitute trial product but it will not be available until next week. 10/30/2021: The wounds are about the same to perhaps slightly smaller. There is still continued slough buildup. Unfortunately, the rep for the keratin based product did not show up today and did not answer his phone when called. 11/08/2021: The wounds are little bit smaller today. She continues to have thick drainage but the surfaces are relatively clean with just a little bit of slough accumulation. She reported to me today that she is unable to completely flex her left ankle and on examination it seems this is potentially related to scar tissue from her wounds. We do have the ProgenaMatrix trial product available for her today. 11/15/2021: Both wounds are smaller today. There is some slough accumulation on the surfaces, but the medial wound, in particular looks like it is filling in and is less deep. She did hear from physical therapy and she is going to start working with them on July 11. She is here for her second application of the trial skin substitute, ProgenaMatrix. 11/22/2021: Both wounds continue to contract, the medial more dramatically than the lateral. Both wounds have a layer of slough on the surface, but underneath this, the gritty fibrous tissue has a little bit more of a pink cast to it rather than being as pale as it has been. 11/29/2021: The wounds are roughly the same size this week, perhaps a millimeter or 2 smaller. The medial wound has filled in and is nearly flush with the surrounding skin surface. She  continues to have a lot of slough accumulation on both surfaces. 12/06/2021: No significant change to her wounds, but she has a new opening on her dorsal foot, just distal to the right lateral ankle wound. The area on her left medial ankle that reopened looks a little bit larger today. She has quite a bit of pain associated with the new wound. 12/12/2021: Her wounds look about the same but the new opening on her right lateral dorsal foot is a little bit bigger. She continues to have a fair amount of pain with this wound. 12/26/2021: The left medial ankle wound is tiny and superficial. She has 2 areas of crusting on her left lateral ankle, however, that appear to be threatening to open again. Her right medial ankle wound is a little bit smaller today but still continues to accumulate thick rubbery slough. The new dorsal foot wound is exquisitely painful but there is no odor or purulent drainage. No erythema or induration. The right lateral ankle wound looks about the same today, again with thick rubbery slough. 01/03/2022: The left medial ankle wound has closed again. Both right ankle wounds appear to be about the same size with thick rubbery slough. The dorsal foot wound on  the right continues to be quite painful and she stated that she did not want any debridement of that site today. Electronic Signature(s) Signed: 01/03/2022 12:36:30 PM By: Fredirick Maudlin MD FACS Entered By: Fredirick Maudlin on 01/03/2022 12:36:30 -------------------------------------------------------------------------------- Physical Exam Details Patient Name: Date of Service: [name] [name], [name] 01/03/2022 11:00 A M Medical Record Number: [id number] Patient Account Number: [id number] Date of Birth/Sex: Treating RN: May 09, 1971 (51 y.o. [name] Primary Care Provider: [name] Other Clinician: Referring Provider: Treating Provider/Extender: Elyse Jarvis Weeks in Treatment:  37 Constitutional . . . . No acute distress.Marland Kitchen Respiratory Normal work of breathing on room air.. Notes 01/03/2022: The left medial ankle wound has closed again. Both right ankle wounds appear to be about the same size with thick rubbery slough. The dorsal foot wound on the right continues to be quite painful and she stated that she did not want any debridement of that site today. Electronic Signature(s) Signed: 01/03/2022 12:37:00 PM By: Fredirick Maudlin MD FACS Entered By: Fredirick Maudlin on 01/03/2022 12:37:00 -------------------------------------------------------------------------------- Physician Orders Details Patient Name: Date of Service: [name] [name], [name] 01/03/2022 11:00 A M Medical Record Number: [id number] Patient Account Number: [id number] Date of Birth/Sex: Treating RN: 03-13-1971 (51 y.o. [name] Primary Care Provider: [name] Other Clinician: Referring Provider: Treating Provider/Extender: Elyse Jarvis Weeks in Treatment: [phone] Verbal / Phone Orders: No Diagnosis Coding ICD-10 Coding Code Description L97.818 Non-pressure chronic ulcer of other part of right lower leg with other specified severity 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 Follow-up Appointments ppointment in 1 week. - 01/10/22 @ 10:45am Dr. Celine Ahr and Roselyn Reef Rm 4 Return A Anesthetic (In clinic) Topical Lidocaine 5% applied to wound bed (In clinic) Topical Lidocaine 4% applied to wound bed Cellular or Tissue Based Products Cellular or Tissue Based Product applied to wound bed, secured with steri-strips, cover with Adaptic or Mepitel. (DO NOT REMOVE). Other Cellular or Tissue Based Products Orders/Instructions: - PROGENA MATRIX #8 01/03/22 Bathing/ Shower/ Hygiene May shower with protection but do not get wound dressing(s) wet. - Can get cast protector bags at [facility] or [facility] Edema Control - Lymphedema / SCD / Other Elevate legs to the level of the heart or above  for 30 minutes daily and/or when sitting, a frequency of: - throughout the day Avoid standing for long periods of time. Exercise regularly Additional Orders / Instructions Follow Nutritious Diet Wound Treatment Wound #17 - Lower Leg Wound Laterality: Right, Lateral Cleanser: Soap and Water 1 x Per Week/30 Days Discharge Instructions: May shower and wash wound with dial antibacterial soap and water prior to dressing change. Cleanser: Wound Cleanser 1 x Per Week/30 Days Discharge Instructions: Cleanse the wound with wound cleanser prior to applying a clean dressing using gauze sponges, not tissue or cotton balls. Peri-Wound Care: Triamcinolone 15 (g) 1 x Per Week/30 Days Discharge Instructions: Use triamcinolone 15 (g) as directed Peri-Wound Care: Sween Lotion (Moisturizing lotion) 1 x Per Week/30 Days Discharge Instructions: Apply moisturizing lotion as directed Prim Dressing: [place] ary 1 x Per Week/30 Days Secondary Dressing: ABD Pad, 5x9 1 x Per Week/30 Days Discharge Instructions: Apply over primary dressing as directed. Secondary Dressing: Zetuvit Plus 4x8 in 1 x Per Week/30 Days Discharge Instructions: Apply over primary dressing as directed. Compression Wrap: Kerlix Roll 4.5x3.1 (in/yd) 1 x Per Week/30 Days Discharge Instructions: Apply Kerlix and Coban compression as directed. Compression Wrap: Coban Self-Adherent Wrap 4x5 (in/yd) 1 x Per Week/30 Days Discharge Instructions: Apply  over Kerlix as directed. Wound #21 - Ankle Wound Laterality: Right, Medial Cleanser: Soap and Water 1 x Per Week/30 Days Discharge Instructions: May shower and wash wound with dial antibacterial soap and water prior to dressing change. Cleanser: Wound Cleanser 1 x Per Week/30 Days Discharge Instructions: Cleanse the wound with wound cleanser prior to applying a clean dressing using gauze sponges, not tissue or cotton balls. Peri-Wound Care: Triamcinolone 15 (g) 1 x Per Week/30 Days Discharge  Instructions: Use triamcinolone 15 (g) as directed Peri-Wound Care: Sween Lotion (Moisturizing lotion) 1 x Per Week/30 Days Discharge Instructions: Apply moisturizing lotion as directed Prim Dressing: [place] ary 1 x Per Week/30 Days Secondary Dressing: ABD Pad, 5x9 1 x Per Week/30 Days Discharge Instructions: Apply over primary dressing as directed. Secondary Dressing: Zetuvit Plus 4x8 in 1 x Per Week/30 Days Discharge Instructions: Apply over primary dressing as directed. Compression Wrap: Kerlix Roll 4.5x3.1 (in/yd) 1 x Per Week/30 Days Discharge Instructions: Apply Kerlix and Coban compression as directed. Compression Wrap: Coban Self-Adherent Wrap 4x5 (in/yd) 1 x Per Week/30 Days Discharge Instructions: Apply over Kerlix as directed. Wound #23 - Ankle Wound Laterality: Right, Lateral, Distal Cleanser: Soap and Water 1 x Per Week/30 Days Discharge Instructions: May shower and wash wound with dial antibacterial soap and water prior to dressing change. Cleanser: Wound Cleanser 1 x Per Week/30 Days Discharge Instructions: Cleanse the wound with wound cleanser prior to applying a clean dressing using gauze sponges, not tissue or cotton balls. Peri-Wound Care: Triamcinolone 15 (g) 1 x Per Week/30 Days Discharge Instructions: Use triamcinolone 15 (g) as directed Peri-Wound Care: Sween Lotion (Moisturizing lotion) 1 x Per Week/30 Days Discharge Instructions: Apply moisturizing lotion as directed Prim Dressing: Santyl Ointment 1 x Per Week/30 Days ary Discharge Instructions: Apply nickel thick amount to wound bed as instructed Secondary Dressing: ABD Pad, 8x10 1 x Per Week/30 Days Discharge Instructions: Apply over primary dressing as directed. Secondary Dressing: Zetuvit Plus 4x8 in 1 x Per Week/30 Days Discharge Instructions: Apply over primary dressing as directed. Compression Wrap: Kerlix Roll 4.5x3.1 (in/yd) 1 x Per Week/30 Days Discharge Instructions: Apply Kerlix and Coban  compression as directed. Compression Wrap: Coban Self-Adherent Wrap 4x5 (in/yd) 1 x Per Week/30 Days Discharge Instructions: Apply over Kerlix as directed. Electronic Signature(s) Signed: 01/03/2022 4:41:53 PM By: Lorrin Jackson Signed: 01/03/2022 4:46:01 PM By: Fredirick Maudlin MD FACS Previous Signature: 01/03/2022 12:42:48 PM Version By: Fredirick Maudlin MD FACS Entered By: Lorrin Jackson on 01/03/2022 13:09:39 -------------------------------------------------------------------------------- Problem List Details Patient Name: Date of Service: [name] [name], [name] 01/03/2022 11:00 A M Medical Record Number: [id number] Patient Account Number: [id number] Date of Birth/Sex: Treating RN: February 16, 1971 (51 y.o. [name] Primary Care Provider: [name] Other Clinician: Referring Provider: Treating Provider/Extender: Elyse Jarvis Weeks in Treatment: 37 Active Problems ICD-10 Encounter Code Description Active Date MDM Diagnosis L97.818 Non-pressure chronic ulcer of other part of right lower leg with other specified 04/17/2021 No Yes severity 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 04/17/2021 No Yes Inactive Problems ICD-10 Code Description Active Date Inactive Date L97.828 Non-pressure chronic ulcer of other part of left lower leg with other specified severity 04/17/2021 04/17/2021 Resolved Problems Electronic Signature(s) Signed: 01/03/2022 12:33:43 PM By: Fredirick Maudlin MD FACS Entered By: Fredirick Maudlin on 01/03/2022 12:33:43 -------------------------------------------------------------------------------- Progress Note Details Patient Name: Date of Service: [name] [name], [name] 01/03/2022 11:00 A M Medical Record Number: [id number] Patient Account Number: [id number] Date of Birth/Sex: Treating RN: 04-20-1971 (51 y.o. F) [name],  [name] Primary Care Provider: [name] Other Clinician: Referring Provider: Treating Provider/Extender: Elyse Jarvis Weeks in Treatment: 37 Subjective Chief Complaint Information obtained from Patient the patient is here for evaluation of her bilateral lower extremity sickle cell ulcers 04/17/2021; patient comes in for substantial wounds on the right and left lower leg History of Present Illness (HPI) The following HPI elements were documented for the patient's wound: Location: medial and lateral ankle region on the right and left medial malleolus Quality: Patient reports experiencing a shooting pain to affected area(s). Severity: Patient states wound(s) are getting worse. Duration: right lower extremity bimalleolar ulcers have been present for approximately 2 years; the rright meedial malleolus ulcer has been there proximally 6 months Timing: Pain in wound is constant (hurts all the time) Context: The wound would happen gradually Associated Signs and Symptoms: Patient reports having increase discharge. 51 year old patient with a history of sickle cell anemia who was last seen by me with ulceration of the right lower extremity above the ankle and was referred to Dr. Leland Johns for a surgical debridement as I was unable to do anything in the office due to excruciating pain. At that stage she was referred from the plastic surgery service to dermatology who treated her for a skin infection with doxycycline and then Levaquin and a local antibiotic ointment. I understand the patient has since developed ulceration on the left ankle both medial and lateral and was now referred back to the wound center as dermatology has finished the management. I do not have any notes from the dermatology department Old notes: 51 year old patient with a history of sickle cell anemia, pain bilateral lower extremities, right lower extremity ulcer and has a history of receiving a skin graft( Theraskin) several months ago. She has been visiting the wound center [facility] and was seen by Dr. Dellia Nims and Dr.  Leland Johns. after prolonged conservator therapy between July 2016 and January 2017. She had been seen by the plastic surgeon and taken to the OR for debridement and application of Theraskin. She had 3 applications of Theraskin and was then treated with collagen. Prior to that she had a history of similar problems in 2014 and was treated conservatively. Had a reflux study done for the right lower extremity in August 2016 without reflux or DVT . Past medical history significant for sickle cell disease, anemia, leg ulcers, cholelithiasis,and has never been a smoker. Once the patient was discharged on the wound center she says within 2 or 3 weeks the problems recurred and she has been treating it conservatively. since I saw her 3 weeks ago at [facility] she has been unable to get her dressing material but has completed a course of doxycycline. 6/7/ 2017 -- lower extremity venous duplex reflux evaluation was done oo No evidence of SVT or DVT in the RLL. No venous incompetence in the RLL. No further vascular workup is indicated at this time. She was seen by Dr. [name], on 10/04/2015. She agreed with the plan of taking her to the OR for debridement and application of theraskin and would also take biopsies to rule out pyoderma gangrenosum. Follow-up note dated May 31 received and she was status post application of Theraskin to multiple ulcers around the right ankle. Pathology did not show evidence of malignancy or pyoderma gangrenosum. She would continue to see as in the wound clinic for further care and see Dr. Leland Johns as needed. The patient brought the biopsy report and it was consistent with stasis ulcer no  evidence of malignancy and the comment was that there was some adjacent neovascularization, fibrosis and patchy perivascular chronic inflammation. 11/15/2015 -- today we applied her first application of Theraskin 11/30/15; TheraSkin #2 12/13/2015 -- she is having a lot of pain locally and is  here for possible application of a theraskin today. 01/16/2016 -- the patient has significant pain and has noticed despite in spite of all local care and oral pain medication. It is impossible to debride her in the office. 02/06/2016 -- I do not see any notes from Dr. Iran Planas( the patient has not made a call to the office know as she heard from them) and the only visit to recently was with her PCP Dr. Danella Penton -- I saw her on 01/16/2016 and prescribed 90 tablets of oxycodone 10 mg and did lab work and screening for HIV. the HIV was negative and hemoglobin was 6.3 with a WBC count of 14.9 and hematocrit of 17.8 with platelets of 561. 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 The bimalleolar ulcers to the right lower extremity have been present for approximately 2 years, the left medial malleolus ulcer has been present approximately 6 months. She has followed with Dr.Thimmappa in the past and has had a total of 3 applications of Theraskin (01/2015, 09/2015, 06/17/16). She has also followed with Dr. Con Memos here in the clinic and has received 2 applications of TheraSkin (11/10/15, 11/30/15). The patient does experience chronic, and is not amenable to debridement. 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 She is not currently on any antibiotic therapy and has not been treated with any recently. 07/17/2016 -- was seen by Dr. Iran Planas of plastic surgery who saw her 2 weeks postop application of Theraskin #3. She had removed her dressing and asked her to apply silver alginate on alternate days and follow-up back with the wound center. Future debridements and application of skin substitute would have to be done in the hospital due to her high risk for anesthesia. READMISSION 04/17/2021 Patient is now a 51 year old woman that we have had in this  clinic for a prolonged period of time and 2016-2017 and then again for 2 visits in February 2018. At that point she had wounds on the right lower leg predominantly medial. She had also been seen by plastic surgery Dr. Leland Johns who I believe took her to the OR for operative debridement and application of TheraSkin in 2017. After she left our clinic she was followed for a very prolonged period of time in the wound care center in [facility] who then referred her ultimately to [facility] where she was seen by Dr. Vernona Rieger. Again taken her to the OR for skin grafting which apparently did not take. She had multiple other attempts at dressings although I have not really looked over all of these notes in great detail. She has not been seen in a wound care center in about a year. She states over the last year in addition to her right lower leg she has developed wounds on the left lower leg quite extensive. She is using Xeroform to all of these wounds without really any improvement. She also has Medicaid which does not cover wound products. The patient has had vascular work-ups in the past including most recently on 03/28/2021 showing biphasic waveforms on the right triphasic at the PTA and biphasic at the dorsalis pedis  on the left. She was unable to tolerate any degree of compression to do ABIs. Unfortunately TBI's were also not done. She had venous reflux studies done in 2017. This did not show any evidence of a DVT or SVT and no venous incompetence was noted in the right leg at the time this was the only side with the wound As noted I did not look all over her old records. She apparently had a course of HBO and [facility] although I am not sure what the indication would have been. In any case she developed seizures and terminated treatment earlier. She is generally much more disabled than when we last saw her in clinic. She can no longer walk pretty much wheelchair-bound because predominantly of pain in the left  hip. 04/24/2021; the patient tolerated the wraps we put on. We used Santyl and Hydrofera Blue under compression. I brought her back for a nurse visit for a change in dressing. With Medicaid we will have a hard time getting anything paid for and hence the need for compression. She arrives in clinic with all the wounds looking somewhat better in terms of surface 12/20; circumferential wound on the right from the lateral to the medial. She has open areas on the left medial and left lateral x2 on all of this with the same surface. This does not look completely healthy although she does have some epithelialization. She is not complaining of a lot of pain which is unusual for her sickle ulcers. I have not looked over her extensive records from [facility]. She had recent arterial studies and has a history of venous reflux studies I will need to look these over although I do not believe she has significant arterial disease 2023 05/22/2021; patient's wound areas measure slightly smaller. Still a lot of drainage coming from the right we have been using Hydrofera Blue and Santyl with some improvement in the wound surfaces. She tells me she will be getting transfused later in the week for her underlying sickle cell anemia I have looked over her recent arterial studies which were done in the fall. This was in November and showed biphasic and triphasic waveforms but she could not tolerate ABIs because of pressure and unfortunately TBI's were not done. She has not had recent venous reflux studies that I can see 1/10; not much change about the same surface area. This has a yellowish surface to it very gritty. We have been using Santyl and Hydrofera Blue for a prolonged period. Culture I did last week showed methicillin sensitive staph aureus "rare". Our intake nurse reports greenish drainage which may be the Hydrofera Blue itself 1/17; wounds are continue to measure smaller although I am not sure about the accuracy here.  Especially the areas on the right are covered in what looks to be a nonviable surface although she does have some epithelialization. Similarly she has areas on the left medial and left lateral ankle area which appear to have a better surface and perhaps are slightly smaller. We have been using Santyl and Hydrofera Blue. She cannot tolerate mechanical debridements She went for her reflux studies which showed significant reflux at the greater saphenous vein at the saphenofemoral junction as well as the greater saphenous vein in the proximal calf on the left she had reflux in the thigh and the common femoral vein and supra vein Fishel vein reflux in the greater saphenous vein. I will have vein and vascular look at this. My thoughts have been that these are likely  sickle wounds. I looked through her old records from [facility] wound care center and then when she graduated to [facility] wound care center where she saw Dr. Zigmund Daniel and Dr. Vernona Rieger. Although I can see she had reflux studies done I do not see that she actually saw a vein and vascular. I went over the fact that she had operative debridements and actual skin grafting that did not take. I do not think these wounds have ever really progressed towards healing 1/31;Substantial wounds on the right ankle area. Hyper granulated very gritty adherent debris on the surface. 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 On the left she has no DVT or SVT deep vein reflux in the common femoral vein. Superficial vein reflux in the greater saphenous vein on although the vein diameter is not really all  that large. I do not think there is anything that can be done with these although I am going to send her for consultation to vein and vascular. 2/7; Wound exam; substantial wound area on the right posterior ankle area and areas on the left medial ankle and left lateral ankle. I was able to debride the left medial ankle last week fairly aggressively and it is back this week to a completely nonviable surface She will see vascular surgery this Friday and I would like them to review the venous studies and also any comments on her arterial status. If they do not see an issue here I am going to refer her to plastic surgery for an operative debridement perhaps intraoperative ACell or Integra. Eventually she will require a deep tissue culture again 2/14; substantial wound area on the right posterior ankle, medial ankle. We have been using silver alginate The patient was seen by vein and vascular she had both venous reflux studies and arterial studies. 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 She had no evidence of superficial venous thrombosis. She did have superficial vein reflux at the saphenofemoral junction and the greater saphenous vein. On the left no evidence of a DVT no evidence of superficial venous throat thrombosis she did have deep vein reflux in the common femoral vein and superficial vein reflux in the greater saphenous vein but these were not felt to be amenable to ablation. In terms of arterial studies she had triphasic and wife biphasic wave waveforms bilaterally not felt to have a significant arterial issue. I do not get the feeling that they felt that any part of her nonhealing wounds were related to either arterial or venous issues. They did note that she had venous reflux at the right at the [facility] and [facility]. And also on the left there were reflux in the deep system at the common femoral vein and greater saphenous  vein in the proximal thigh. Nothing amenable to ablation. 2/20; she is making some decent progress on the right where there is nice skin between the 2 open areas on the right ankle. The surfaces here do not look viable yet there is some surrounding epithelialization. She still has a  small area on the left medial ankle area. Hyper-granulated Jody's away always 2/28 patient has an appointment with plastic surgery on 3/8. We will see her back on 3/9. She may have to call us to get the area redressed. We've been using Santyl under silver alginate. We made a nice improvement on the left medial ankle. The larger wounds on the right also looks somewhat better in terms of epithelialization although I think they could benefit from an aggressive debridement if plastic surgery would be willing to do that. Perhaps placement of Integra or a cell 07/26/2021: She saw Dr. Claudia Desanctis yesterday. He raised the question as to whether or not this might be pyoderma and wanted to wait until that question was answered by dermatology before proceeding with any sort of operative debridement. We have continue to use Santyl under silver alginate with Kerlix and Coban wraps. Overall, her wounds appear to be continuing to contract and epithelialize, with some granulation tissue present. There continues to be some slough on all wound surfaces. 08/09/2021: She has not been able to get an appointment with dermatology because apparently the offices in [place] do not accept Medicaid. She is looking into whether or not she can be seen at the main [facility] dermatology clinic. This is necessary because plastic surgery is concerned that her wounds might represent pyoderma and they did not want to do any procedure until that was clarified. We have been using Santyl under silver alginate with Kerlix and Coban wraps. Today, there was a greater amount of drainage on her dressings with a slight green discoloration and  significant odor. Despite this, her wounds continue to contract and epithelialize. There is pale granulation tissue present and actually, on the left medial ankle, the granulation tissue is a bit hypertrophic. 08/16/2021: Last week, I took a culture and this grew back rare methicillin-resistant Staph aureus and rare corynebacterium. The MRSA was sensitive to gentamicin which we began applying topically on an empiric basis. This week, her wounds are a bit smaller and the drainage and odor are less. Her primary care provider is working on assisting the patient with a dermatology evaluation. She has been in silver alginate over the gentamicin that was started last week along with Kerlix and Coban wraps. 08/23/2021: Because she has Medicaid, we have been unable to get her into see any dermatologist in the [place] to rule out pyoderma gangrenosum, which was a requirement from plastic surgery prior to any sort of debridement and grafting. Despite this, however, all of her wounds continue to get smaller. The wound on her left medial ankle is nearly closed. There is no odor from the wounds, although she still accumulates a modest amount of drainage on her dressings. 08/30/2021: The lateral right ankle wound and the medial left ankle wound are a bit smaller today. The medial right ankle wound is about the same size. They are less tender. We have still been unable to get her into dermatology. 09/06/2021: All of the wounds are about the same size today. She continues to endorse minimal pain. I communicated with Dr. Claudia Desanctis in plastic surgery regarding our issues getting a dermatology appointment; he was out of town but indicated that he would look into perhaps performing the biopsy in his office and will have his office contact her. 09/14/2021: The patient has an appointment in dermatology, but it is not until October. Her wounds are roughly the same; she continues to have very thick purulent-looking drainage on her  dressings.  09/20/2021: The left medial wound is nearly closed and just has a bit of accumulated eschar on the surface. The right medial and lateral ankle wounds are perhaps a little bit smaller. They continue to have a very pale surface with accumulation of thin slough. 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 I did not think [employer] was indicated based on this. She is getting topical mupirocin with Prisma silver collagen. 10/04/2021: The patient was not seen in clinic last week due to childcare coverage issues. In the interim, the left medial leg wound has closed. The right sided leg wounds are smaller. There is more granulation tissue coming through, particularly on the lateral wound. The surface remains somewhat gritty. We have been applying topical mupirocin and Prisma silver collagen. 10/11/2021: The left medial leg wound remains closed. She does complain of some anesthetic sensation to the area. Both of the right-sided leg wounds are smaller but still have accumulated slough. 10/18/2021: Both right-sided leg wounds are minimally smaller this week. She still continues to accumulate slough and has thick drainage on her dressings. 10/23/2021: Both wounds continue to contract. There is still slough buildup. She has been approved for a keratin-based skin substitute trial product but it will not be available until next week. 10/30/2021: The wounds are about the same to perhaps slightly smaller. There is still continued slough buildup. Unfortunately, the rep for the keratin based product did not show up today and did not answer his phone when called. 11/08/2021: The wounds are little bit smaller today. She continues to have thick drainage but the surfaces are relatively clean with just a little bit of slough accumulation. She reported to me today that she is unable to completely flex her left ankle and on examination it seems this is potentially related to scar tissue from her  wounds. We do have the ProgenaMatrix trial product available for her today. 11/15/2021: Both wounds are smaller today. There is some slough accumulation on the surfaces, but the medial wound, in particular looks like it is filling in and is less deep. She did hear from physical therapy and she is going to start working with them on July 11. She is here for her second application of the trial skin substitute, ProgenaMatrix. 11/22/2021: Both wounds continue to contract, the medial more dramatically than the lateral. Both wounds have a layer of slough on the surface, but underneath this, the gritty fibrous tissue has a little bit more of a pink cast to it rather than being as pale as it has been. 11/29/2021: The wounds are roughly the same size this week, perhaps a millimeter or 2 smaller. The medial wound has filled in and is nearly flush with the surrounding skin surface. She continues to have a lot of slough accumulation on both surfaces. 12/06/2021: No significant change to her wounds, but she has a new opening on her dorsal foot, just distal to the right lateral ankle wound. The area on her left medial ankle that reopened looks a little bit larger today. She has quite a bit of pain associated with the new wound. 12/12/2021: Her wounds look about the same but the new opening on her right lateral dorsal foot is a little bit bigger. She continues to have a fair amount of pain with this wound. 12/26/2021: The left medial ankle wound is tiny and superficial. She has 2 areas of crusting on her left lateral ankle, however, that appear to be threatening to open again. Her right  medial ankle wound is a little bit smaller today but still continues to accumulate thick rubbery slough. The new dorsal foot wound is exquisitely painful but there is no odor or purulent drainage. No erythema or induration. The right lateral ankle wound looks about the same today, again with thick rubbery slough. 01/03/2022: The left medial  ankle wound has closed again. Both right ankle wounds appear to be about the same size with thick rubbery slough. The dorsal foot wound on the right continues to be quite painful and she stated that she did not want any debridement of that site today. Patient History Information obtained from Patient. Family History Diabetes - Mother, Lung Disease - Mother, No family history of Cancer, Heart Disease, Hereditary Spherocytosis, Hypertension, Kidney Disease, Seizures, Stroke, Thyroid Problems, Tuberculosis. Social History Never smoker, Marital Status - Married, Alcohol Use - Never, Drug Use - No History, Caffeine Use - Daily. Medical History Eyes Denies history of Cataracts, Glaucoma, Optic Neuritis Ear/Nose/Mouth/Throat Denies history of Chronic sinus problems/congestion, Middle ear problems Hematologic/Lymphatic Patient has history of Anemia, Sickle Cell Disease Denies history of Hemophilia, Human Immunodeficiency Virus, Lymphedema Respiratory Denies history of Aspiration, Asthma, Chronic Obstructive Pulmonary Disease (COPD), Pneumothorax, Sleep Apnea, Tuberculosis Cardiovascular Denies history of Angina, Arrhythmia, Congestive Heart Failure, Coronary Artery Disease, Deep Vein Thrombosis, Hypertension, Hypotension, Myocardial Infarction, Peripheral Arterial Disease, Peripheral Venous Disease, Phlebitis, Vasculitis Gastrointestinal Denies history of Cirrhosis , Colitis, Crohnoos, Hepatitis A, Hepatitis B, Hepatitis C Endocrine Denies history of Type I Diabetes, Type II Diabetes Genitourinary Denies history of End Stage Renal Disease Immunological Denies history of Lupus Erythematosus, Raynaudoos, Scleroderma Integumentary (Skin) Denies history of History of Burn Musculoskeletal Denies history of Gout, Rheumatoid Arthritis, Osteoarthritis, Osteomyelitis Neurologic Patient has history of Neuropathy - right foot intermittant Denies history of Dementia, Quadriplegia, Paraplegia,  Seizure Disorder Oncologic Denies history of Received Chemotherapy, Received Radiation Psychiatric Denies history of Anorexia/bulimia, Confinement Anxiety Hospitalization/Surgery History - c section x2. - left breast lumpectomy. - iandD right ankle with theraskin. Medical A Surgical History Notes nd Constitutional Symptoms (General Health) H/O miscarriage Cardiovascular bradycardia Gastrointestinal cholilithiasis Objective Constitutional No acute distress.. Vitals Time Taken: 11:18 AM, Height: 67 in, Temperature: 98.6 F, Pulse: 90 bpm, Respiratory Rate: 18 breaths/min, Blood Pressure: 133/64 mmHg. Respiratory Normal work of breathing on room air.. General Notes: 01/03/2022: The left medial ankle wound has closed again. Both right ankle wounds appear to be about the same size with thick rubbery slough. The dorsal foot wound on the right continues to be quite painful and she stated that she did not want any debridement of that site today. Integumentary (Hair, Skin) Wound #17 status is Open. Original cause of wound was Gradually Appeared. The date acquired was: 10/05/2012. The wound has been in treatment 37 weeks. The wound is located on the Right,Lateral Lower Leg. The wound measures 7cm length x 6cm width x 0.1cm depth; 32.987cm^2 area and 3.299cm^3 volume. There is Fat Layer (Subcutaneous Tissue) exposed. There is no tunneling or undermining noted. There is a large amount of purulent drainage noted. The wound margin is distinct with the outline attached to the wound base. There is small (1-33%) pink, pale granulation within the wound bed. There is a large (67-100%) amount of necrotic tissue within the wound bed including Adherent Slough. Wound #21 status is Open. Original cause of wound was Gradually Appeared. The date acquired was: 06/26/2021. The wound has been in treatment 27 weeks. The wound is located on the Right,Medial Ankle. The wound measures 7.2cm length x  3.5cm width x 0.1cm  depth; 19.792cm^2 area and 1.979cm^3 volume. There is Fat Layer (Subcutaneous Tissue) exposed. There is no tunneling or undermining noted. There is a medium amount of purulent drainage noted. The wound margin is distinct with the outline attached to the wound base. There is small (1-33%) pink granulation within the wound bed. There is a large (67-100%) amount of necrotic tissue within the wound bed including Adherent Slough. Wound #22 status is Open. Original cause of wound was Gradually Appeared. The date acquired was: 11/29/2021. The wound has been in treatment 5 weeks. The wound is located on the Left,Medial Ankle. The wound measures 0cm length x 0cm width x 0cm depth; 0cm^2 area and 0cm^3 volume. Wound #22 status is Healed - Epithelialized. Original cause of wound was Gradually Appeared. The date acquired was: 11/29/2021. The wound has been in treatment 5 weeks. The wound is located on the Left,Medial Ankle. The wound measures 0cm length x 0cm width x 0cm depth; 0cm^2 area and 0cm^3 volume. Wound #23 status is Open. Original cause of wound was Gradually Appeared. The date acquired was: 12/06/2021. The wound has been in treatment 3 weeks. The wound is located on the Right,Distal,Lateral Ankle. The wound measures 4cm length x 2cm width x 0.2cm depth; 6.283cm^2 area and 1.257cm^3 volume. There is Fat Layer (Subcutaneous Tissue) exposed. There is no tunneling or undermining noted. There is a medium amount of serosanguineous drainage noted. The wound margin is distinct with the outline attached to the wound base. There is large (67-100%) pink granulation within the wound bed. There is a small (1- 33%) amount of necrotic tissue within the wound bed including Adherent Slough. 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 There was a Excisional Skin/Subcutaneous Tissue Debridement with a total area of 42 sq cm performed by Fredirick Maudlin, MD. With the following instrument(s): Curette to remove Non-Viable tissue/material. Material removed includes Subcutaneous Tissue and Slough and after achieving pain control using Lidocaine 4% T opical Solution. No specimens were taken. A time out was conducted at 11:39, prior to the start of the procedure. A Minimum amount of bleeding was controlled with Pressure. The procedure was tolerated well. Post Debridement Measurements: 7cm length x 6cm width x 0.1cm depth; 3.299cm^3 volume. Character of Wound/Ulcer Post Debridement is stable. Post procedure Diagnosis Wound #17: Same as Pre-Procedure Pre-procedure diagnosis of Wound #17 is a Sickle Cell Lesion located on the Right,Lateral Lower Leg. A skin graft procedure using a bioengineered skin substitute/cellular or tissue based product was performed by Fredirick Maudlin, MD with the following instrument(s): Blade, Forceps, and Scissors. Other was applied and secured with Steri-Strips. 36 sq cm of product was utilized and 0 sq cm was wasted. Post Application, Adaptic was applied. A Time Out was conducted at 11:40, prior to the start of the procedure. The procedure was tolerated well. Post procedure Diagnosis Wound #17: Same as Pre-Procedure General Notes: Donated Product. Wound #21 Pre-procedure diagnosis of Wound #21 is a Sickle Cell Lesion located on the Right,Medial Ankle .Severity of Tissue Pre Debridement is: Fat layer exposed. There was a Excisional Skin/Subcutaneous Tissue Debridement with a total area of 25.2 sq cm performed by Fredirick Maudlin, MD. With the following instrument(s): Curette to remove Non-Viable tissue/material. Material removed includes Subcutaneous Tissue and Slough and after achieving pain  control using Lidocaine 4% T opical Solution. No specimens were  taken. A time out was conducted at 11:39, prior to the start of the procedure. A Minimum amount of bleeding was controlled with Pressure. The procedure was tolerated well. Post Debridement Measurements: 7.2cm length x 3.5cm width x 0.1cm depth; 1.979cm^3 volume. Character of Wound/Ulcer Post Debridement is stable. Severity of Tissue Post Debridement is: Fat layer exposed. Post procedure Diagnosis Wound #21: Same as Pre-Procedure Pre-procedure diagnosis of Wound #21 is a Sickle Cell Lesion located on the Right,Medial Ankle. A skin graft procedure using a bioengineered skin substitute/cellular or tissue based product was performed by Fredirick Maudlin, MD with the following instrument(s): Blade, Forceps, and Scissors. Other was applied and secured with Steri-Strips. 36 sq cm of product was utilized and 0 sq cm was wasted. Post Application, Adaptic was applied. A Time Out was conducted at 11:40, prior to the start of the procedure. The procedure was tolerated well. Post procedure Diagnosis Wound #21: Same as Pre-Procedure General Notes: Donated Product. Wound #23 Pre-procedure diagnosis of Wound #23 is a Sickle Cell Lesion located on the Right,Distal,Lateral Ankle . There was a Chemical/Enzymatic/Mechanical debridement performed by Fredirick Maudlin, MD. to remove Non-Viable tissue/material. Material removed includes Subcutaneous Tissue and Slough and after achieving pain control using Lidocaine 4% Topical Solution. Agent used was [employer]. A time out was conducted at 11:39, prior to the start of the procedure. There was no bleeding. The procedure was tolerated well. Post Debridement Measurements: 4cm length x 2cm width x 0.2cm depth; 1.257cm^3 volume. Character of Wound/Ulcer Post Debridement is stable. Post procedure Diagnosis Wound #23: Same as Pre-Procedure Plan Follow-up Appointments: Return Appointment in 1 week. - 01/10/22 @ 10:45am Dr. Celine Ahr and Roselyn Reef Rm 4 Cellular or Tissue Based  Products: Cellular or Tissue Based Product applied to wound bed, secured with steri-strips, cover with Adaptic or Mepitel. (DO NOT REMOVE). Other Cellular or Tissue Based Products Orders/Instructions: - PROGENA MATRIX #8 01/03/22 Bathing/ Shower/ Hygiene: May shower with protection but do not get wound dressing(s) wet. - Can get cast protector bags at [facility] or [facility] Edema Control - Lymphedema / SCD / Other: Elevate legs to the level of the heart or above for 30 minutes daily and/or when sitting, a frequency of: - throughout the day Avoid standing for long periods of time. Exercise regularly Additional Orders / Instructions: Follow Nutritious Diet WOUND #17: - Lower Leg Wound Laterality: Right, Lateral Cleanser: Soap and Water 1 x Per Week/30 Days Discharge Instructions: May shower and wash wound with dial antibacterial soap and water prior to dressing change. Cleanser: Wound Cleanser 1 x Per Week/30 Days Discharge Instructions: Cleanse the wound with wound cleanser prior to applying a clean dressing using gauze sponges, not tissue or cotton balls. Peri-Wound Care: Triamcinolone 15 (g) 1 x Per Week/30 Days Discharge Instructions: Use triamcinolone 15 (g) as directed Peri-Wound Care: Sween Lotion (Moisturizing lotion) 1 x Per Week/30 Days Discharge Instructions: Apply moisturizing lotion as directed Prim Dressing: PROGENA MATRIX 1 x Per Week/30 Days ary Secondary Dressing: ABD Pad, 5x9 1 x Per Week/30 Days Discharge Instructions: Apply over primary dressing as directed. Secondary Dressing: Zetuvit Plus 4x8 in 1 x Per Week/30 Days Discharge Instructions: Apply over primary dressing as directed. Com pression Wrap: Kerlix Roll 4.5x3.1 (in/yd) 1 x Per Week/30 Days Discharge Instructions: Apply Kerlix and Coban compression as directed. Com pression Wrap: Coban Self-Adherent Wrap 4x5 (in/yd) 1 x Per Week/30 Days Discharge Instructions: Apply over Kerlix as directed. WOUND #21: - Ankle  Wound  Laterality: Right, Medial Cleanser: Soap and Water 1 x Per Week/30 Days Discharge Instructions: May shower and wash wound with dial antibacterial soap and water prior to dressing change. Cleanser: Wound Cleanser 1 x Per Week/30 Days Discharge Instructions: Cleanse the wound with wound cleanser prior to applying a clean dressing using gauze sponges, not tissue or cotton balls. Peri-Wound Care: Triamcinolone 15 (g) 1 x Per Week/30 Days Discharge Instructions: Use triamcinolone 15 (g) as directed Peri-Wound Care: Sween Lotion (Moisturizing lotion) 1 x Per Week/30 Days Discharge Instructions: Apply moisturizing lotion as directed Prim Dressing: PROGENA MATRIX 1 x Per Week/30 Days ary Secondary Dressing: ABD Pad, 5x9 1 x Per Week/30 Days Discharge Instructions: Apply over primary dressing as directed. Secondary Dressing: Zetuvit Plus 4x8 in 1 x Per Week/30 Days Discharge Instructions: Apply over primary dressing as directed. Com pression Wrap: Kerlix Roll 4.5x3.1 (in/yd) 1 x Per Week/30 Days Discharge Instructions: Apply Kerlix and Coban compression as directed. Com pression Wrap: Coban Self-Adherent Wrap 4x5 (in/yd) 1 x Per Week/30 Days Discharge Instructions: Apply over Kerlix as directed. WOUND #23: - Ankle Wound Laterality: Right, Lateral, Distal Cleanser: Soap and Water 1 x Per Week/30 Days Discharge Instructions: May shower and wash wound with dial antibacterial soap and water prior to dressing change. Cleanser: Wound Cleanser 1 x Per Week/30 Days Discharge Instructions: Cleanse the wound with wound cleanser prior to applying a clean dressing using gauze sponges, not tissue or cotton balls. Peri-Wound Care: Triamcinolone 15 (g) 1 x Per Week/30 Days Discharge Instructions: Use triamcinolone 15 (g) as directed Peri-Wound Care: Sween Lotion (Moisturizing lotion) 1 x Per Week/30 Days Discharge Instructions: Apply moisturizing lotion as directed Prim Dressing: Santyl Ointment 1 x Per Week/30  Days ary Discharge Instructions: Apply nickel thick amount to wound bed as instructed Secondary Dressing: ABD Pad, 8x10 1 x Per Week/30 Days Discharge Instructions: Apply over primary dressing as directed. Secondary Dressing: Zetuvit Plus 4x8 in 1 x Per Week/30 Days Discharge Instructions: Apply over primary dressing as directed. Com pression Wrap: Kerlix Roll 4.5x3.1 (in/yd) 1 x Per Week/30 Days Discharge Instructions: Apply Kerlix and Coban compression as directed. Com pression Wrap: Coban Self-Adherent Wrap 4x5 (in/yd) 1 x Per Week/30 Days Discharge Instructions: Apply over Kerlix as directed. 01/03/2022: The left medial ankle wound has closed again. Both right ankle wounds appear to be about the same size with thick rubbery slough. The dorsal foot wound on the right continues to be quite painful and she stated that she did not want any debridement of that site today. I used a curette to debride the slough off of the areas that the patient would permit. We applied Santyl to the dorsal foot wound to try and enzymatically debride this site. I then applied the donated tissue product (no charge, ProgenaMatrix) to both the medial and lateral right ankle wounds. It was secured in place with Adaptic and Steri-Strips. Her leg was wrapped with Kerlix and Coban. She will follow-up in 1 week. Electronic Signature(s) Signed: 01/03/2022 12:38:41 PM By: Fredirick Maudlin MD FACS Entered By: Fredirick Maudlin on 01/03/2022 12:38:41 -------------------------------------------------------------------------------- HxROS Details Patient Name: Date of Service: [name] [name], [name] 01/03/2022 11:00 A M Medical Record Number: [id number] Patient Account Number: [id number] Date of Birth/Sex: Treating RN: 05/11/1971 (51 y.o. [name] Primary Care Provider: [name] Other Clinician: Referring Provider: Treating Provider/Extender: Elyse Jarvis Weeks in Treatment: 37 Information  Obtained From Patient Constitutional Symptoms (General Health) Medical History: Past Medical History Notes: H/O miscarriage  Eyes Medical History: Negative for: Cataracts; Glaucoma; Optic Neuritis Ear/Nose/Mouth/Throat Medical History: Negative for: Chronic sinus problems/congestion; Middle ear problems Hematologic/Lymphatic Medical History: Positive for: Anemia; Sickle Cell Disease Negative for: Hemophilia; Human Immunodeficiency Virus; Lymphedema Respiratory Medical History: Negative for: Aspiration; Asthma; Chronic Obstructive Pulmonary Disease (COPD); Pneumothorax; Sleep Apnea; Tuberculosis Cardiovascular Medical History: Negative for: Angina; Arrhythmia; Congestive Heart Failure; Coronary Artery Disease; Deep Vein Thrombosis; Hypertension; Hypotension; Myocardial Infarction; Peripheral Arterial Disease; Peripheral Venous Disease; Phlebitis; Vasculitis Past Medical History Notes: bradycardia Gastrointestinal Medical History: Negative for: Cirrhosis ; Colitis; Crohns; Hepatitis A; Hepatitis B; Hepatitis C Past Medical History Notes: cholilithiasis Endocrine Medical History: Negative for: Type I Diabetes; Type II Diabetes Genitourinary Medical History: Negative for: End Stage Renal Disease Immunological Medical History: Negative for: Lupus Erythematosus; Raynauds; Scleroderma Integumentary (Skin) Medical History: Negative for: History of Burn Musculoskeletal Medical History: Negative for: Gout; Rheumatoid Arthritis; Osteoarthritis; Osteomyelitis Neurologic Medical History: Positive for: Neuropathy - right foot intermittant Negative for: Dementia; Quadriplegia; Paraplegia; Seizure Disorder Oncologic Medical History: Negative for: Received Chemotherapy; Received Radiation Psychiatric Medical History: Negative for: Anorexia/bulimia; Confinement Anxiety Immunizations Pneumococcal Vaccine: Received Pneumococcal Vaccination: No Implantable  Devices None Hospitalization / Surgery History Type of Hospitalization/Surgery c section x2 left breast lumpectomy iandD right ankle with theraskin Family and Social History Cancer: No; Diabetes: Yes - Mother; Heart Disease: No; Hereditary Spherocytosis: No; Hypertension: No; Kidney Disease: No; Lung Disease: Yes - Mother; Seizures: No; Stroke: No; Thyroid Problems: No; Tuberculosis: No; Never smoker; Marital Status - Married; Alcohol Use: Never; Drug Use: No History; Caffeine Use: Daily; Financial Concerns: No; Food, Clothing or Shelter Needs: No; Support System Lacking: No; Transportation Concerns: No Electronic Signature(s) Signed: 01/03/2022 12:42:48 PM By: Fredirick Maudlin MD FACS Signed: 01/04/2022 5:30:22 PM By: Dellie Catholic RN Entered By: Fredirick Maudlin on 01/03/2022 12:36:37 -------------------------------------------------------------------------------- SuperBill Details Patient Name: Date of Service: [place], [name] 01/03/2022 Medical Record Number: [id number] Patient Account Number: [id number] Date of Birth/Sex: Treating RN: 1970/11/13 (51 y.o. [name] Primary Care Provider: [name] Other Clinician: Referring Provider: Treating Provider/Extender: Elyse Jarvis Weeks in Treatment: 37 Diagnosis Coding ICD-10 Codes Code Description [phone] Non-pressure chronic ulcer of other part of right lower leg with other specified severity L97.828 Non-pressure chronic ulcer of other part of left lower leg with other specified severity 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 Facility Procedures CPT4 Code: [id number] Description: 12751 - SKIN SUB GRAFT TRNK/ARM/LEG ICD-10 Diagnosis Description L97.818 Non-pressure chronic ulcer of other part of right lower leg with other specified Modifier: severity Quantity: 1 CPT4 Code: [id number] Description: 49675 - SKIN SUB GRAFT T/A/L ADD-ON ICD-10 Diagnosis Description L97.818 Non-pressure chronic ulcer  of other part of right lower leg with other specified Modifier: severity Quantity: 2 Physician Procedures : CPT4 Code Description Modifier [id number] 99214 - WC PHYS LEVEL 4 - EST PT 25 ICD-10 Diagnosis Description L97.818 Non-pressure chronic ulcer of other part of right lower leg with other specified severity L97.828 Non-pressure chronic ulcer of other part  of left lower leg with other specified severity Quantity: 1 : [id number] 01779 - WC PHYS SKIN SUB GRAFT TRNK/ARM/LEG ICD-10 Diagnosis Description L97.818 Non-pressure chronic ulcer of other part of right lower leg with other specified severity Quantity: 1 : [id number] 23300 - WC PHYS SKIN SUB GRAFT T/A/L ADD-ON ICD-10 Diagnosis Description L97.818 Non-pressure chronic ulcer of other part of right lower leg with other specified severity Quantity: 2 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

## 2022-01-08 ENCOUNTER — Ambulatory Visit: Payer: Medicaid Other

## 2022-01-08 DIAGNOSIS — M6281 Muscle weakness (generalized): Secondary | ICD-10-CM

## 2022-01-08 DIAGNOSIS — R2689 Other abnormalities of gait and mobility: Secondary | ICD-10-CM

## 2022-01-08 DIAGNOSIS — M25672 Stiffness of left ankle, not elsewhere classified: Secondary | ICD-10-CM

## 2022-01-08 DIAGNOSIS — M25572 Pain in left ankle and joints of left foot: Secondary | ICD-10-CM | POA: Diagnosis not present

## 2022-01-08 NOTE — Therapy (Signed)
OUTPATIENT PHYSICAL THERAPY TREATMENT NOTE   Patient Name: [name] MRN: [id number] DOB:10/09/1970, 51 y.o., female 22 Date: 01/08/2022  PCP:  [name], FNP  REFERRING PROVIDER: Fredirick Maudlin, MD  END OF SESSION:   PT End of Session - 01/08/22 1049     Visit Number 9    Number of Visits 10    Date for PT Re-Evaluation 01/22/22    Authorization Type [place] - Visit Number 3    Authorization - Number of Visits 4    PT Start Time 1050    PT Stop Time 1130    PT Time Calculation (min) 40 min    Activity Tolerance Patient limited by pain    Behavior During Therapy [facility] for tasks assessed/performed                Past Medical History:  Diagnosis Date   Anemia 03/30/2012   Hx of sickle cell disease   CAP (community acquired pneumonia)    2014   Cholelithiasis 03/30/2012   Chronic wound of extremity 04/01/2012   Elevated LFTs    Leg ulcer ([place]) 10/27/2012   Chronic under care of wound clinic   Multiple open wounds of lower extremity    chronic wounds B/LLE   Sickle cell disease ([place])    Sinus bradycardia by electrocardiogram 04/03/2012   Past Surgical History:  Procedure Laterality Date   ALLOGRAFT APPLICATION Right 1/91/4782   Procedure: SURGICAL PREP FOR GRAFTING RIGHT LOWER EXTREMITY AND APPLICATION OF Jannifer Hick;  Surgeon: Irene Limbo, MD;  Location: [place];  Service: Plastics;  Laterality: Right;   APPLICATION OF A-CELL OF EXTREMITY Right 10/09/2015   Procedure: APPLICATION OF Jannifer Hick;  Surgeon: Irene Limbo, MD;  Location: [place];  Service: Plastics;  Laterality: Right;   BREAST SURGERY     left breast cyst aspiration   CESAREAN SECTION     CESAREAN SECTION N/A 01/06/2014   Procedure: CESAREAN SECTION;  Surgeon: Emily Filbert, MD;  Location: [facility];  Service: Obstetrics;  Laterality: N/A;   CESAREAN SECTION N/A 05/04/2017   Procedure: CESAREAN SECTION;   Surgeon: Woodroe Mode, MD;  Location: [place];  Service: Obstetrics;  Laterality: N/A;   I & D EXTREMITY Right 10/09/2015   Procedure: SURGICAL PREPARATION FOR GRAFTING RIGHT ANKLE AND APPLICATION THERASKIN;  Surgeon: Irene Limbo, MD;  Location: [place];  Service: Plastics;  Laterality: Right;   SKIN FULL THICKNESS GRAFT Bilateral 06/17/2016   Procedure: SURGICAL PREP FOR GRAFTING, BILATERAL LOWER EXTREMITIES AND APPLICATION OF Jannifer Hick;  Surgeon: Irene Limbo, MD;  Location: [place];  Service: Plastics;  Laterality: Bilateral;   TONSILLECTOMY     Patient Active Problem List   Diagnosis Date Noted   Protein-calorie malnutrition, severe 04/06/2021   Bilateral leg ulcer ([place]) 03/28/2021   Low hemoglobin 05/01/2020   At risk for seizures due to oxygen toxicity 02/09/2020   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   Venous stasis 01/19/2020   Partial loss of skin graft 01/12/2020   Acute respiratory failure with hypoxia ([place]) 12/15/2019   Bone infarction of distal tibia, 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   Sickle cell anemia with crisis ([place]) 09/03/2018   Other chronic pain    Sickle cell crisis ([place]) 06/26/2018   Sickle cell pain crisis ([place]) 04/07/2018   Avascular necrosis of bone of hip, left ([place]) 12/08/2017   Avascular necrosis of bone of hip, right ([place]) 12/08/2017   Hb-SS  disease without crisis ([place]) 11/26/2017   Anemia of chronic disease 11/26/2017   Abnormal quad screen    Ringworm of body 04/05/2017   Ulcer of right lower extremity ([place])    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[place]) 03/02/2017   History of pre-eclampsia 12/16/2016   Previous cesarean delivery x 2 07/22/2013   Hemochromatosis 11/10/2012   Chronic pain syndrome 43/32/9518   Complicated wound infection 04/01/2012   Leukocytosis 03/30/2012    REFERRING DIAG: Decreased L ankle ROM  THERAPY DIAG: Decreased L ankle ROM   Rationale for Evaluation and Treatment  Rehabilitation  PERTINENT HISTORY: Sickle Cell disease.   PRECAUTIONS: None  SUBJECTIVE: 4/10 L ankle pain today, no changes to reports following lasts session due to chronicity and underlying pathologies.  PAIN:  Are you having pain? Yes: NPRS scale: 2/10 Pain location: L ankle Pain description: Pulling sensation Aggravating factors: Walking, squatting, stairs Relieving factors: Sitting, laying down, pain medication.   OBJECTIVE:    DIAGNOSTIC FINDINGS: None   PATIENT SURVEYS:  LEFS 19/80; 12/11/21 17/80   COGNITION:           Overall cognitive status: Within functional limits for tasks assessed                          SENSATION: WFL   POSTURE: No Significant postural limitations   PALPATION: None.    LOWER EXTREMITY ROM:   Active ROM Right AROM eval Left AROM eval Left PROM Left PROM 12/31/2021   Hip flexion WFL Mod limitation   Mod limitation  Hip extension         Hip abduction Min limitation Severe limitation   Severe limitation  Hip adduction WFL Severe limitation   Severe limitation  Hip internal rotation Mod limitation Severe limitation   Severe limitation  Hip external rotation Min limitation Severe limitation   Severe limitation  Knee flexion [facility]   WFL  Knee extension [facility]   WFL  Ankle dorsiflexion NT  -10 (lacking) -8 -7  Ankle plantarflexion NT  50   52  Ankle inversion NT  18   25  Ankle eversion NT  20   19   (Blank rows = not tested)   LOWER EXTREMITY MMT:   MMT Right eval Left eval Left 12/31/2021  Hip flexion 5 3+ 3+  Knee flexion NT  5 5  Knee extension NT  5 5  Ankle dorsiflexion NT  4+ 4+  Ankle plantarflexion NT  4+ 4+  Ankle inversion NT  4 4  Ankle eversion NT  4 4   (Blank rows = not tested)   FUNCTIONAL TESTS:  Timed up and go (TUG): 19 sec TUG: 18.97 sec   GAIT: Distance walked: 61f  Assistive device utilized: Single point cane Level of assistance: Complete Independence Comments: Use of SPC, walking on tippee  toes on L LE.    TODAY'S TREATMENT: [facility] Adult PT Treatment:                                                DATE: 01/08/22 Therapeutic Exercise: Nustep L4 8 min Step ups LLE onto 6 in with airex on top 15x R step ups onto 8 in block from airex to focus on L ankle stability 15x RB A/P tilt 30s x2, then static stand 30s  x2 Tandem and retrowalking on airex beam 5 trips fingertip support Wall squats x15 against ball L SLS on Airex occasional fingertip support 30s x2 occasional fingertip support Tandem stance airex 2 positions 30s x2 occasional fingertip support Runners step leading L 15x 4 in block Sidestepping on airex beam no UE support 5 trips   [facility] Adult PT Treatment:                                                DATE: 01/03/22 Therapeutic Exercise: Nustep L 8 min Stair negotiation 16 steps ascending step through w/cane and single rail, descending with step to pattern single rail and cane  Step ups L 4 in with airex on top 15x R step ups onto 6 in block from airex to focus on L ankle stability 15x RB A/P tilt 30s x2, then static stand 30s x2 Tandem and retrowalking on airex beam 5 trips fingertip support Wall squats x15 against ball L SLS on Airex occasional fingertip support 30s x3 Runners step leading L 15x 4 in block Sidestepping on airex beam no UE support 5 trips   [facility] Adult PT Treatment:                                                DATE: 12/31/21 Therapeutic Exercise: Nustep L2 8 min Stair negotiation 16 steps ascending step through w/cane and single rail, descending with step to pattern single rail and cane  Step ups L 4 in with airex on top 15x R step ups onto 6 in block from airex to focus on L ankle stability 15x PF/DF against wall 15x ea. Marching against wall 15/15 LAQs w/adduction 15x2 Wall squats x15    PATIENT EDUCATION:  Education details: Educated pt on anatomy and physiology of current symptoms, LEFS questionnaire, diagnosis, prognosis, HEP,  and  POC. Person educated: Patient Education method: Explanation, Demonstration, Tactile cues, Verbal cues, and Handouts Education comprehension: verbalized understanding and returned demonstration   HOME EXERCISE PROGRAM: Access Code: [email or web address] URL: [email or web address] Date: 11/27/2021 Prepared by: Rudi Heap   Exercises - Supine Single Leg Ankle Pumps  - 2 x daily - 7 x weekly - 3 sets - 10 reps - Ankle Alphabet in Elevation  - 2 x daily - 7 x weekly - 2 sets - Seated Toe Raise  - 2 x daily - 7 x weekly - 2 sets - 10 reps - Seated Long Arc Quad  - 2 x daily - 7 x weekly - 2 sets - 10 reps - Long Sitting Ankle Eversion with Resistance  - 2 x daily - 7 x weekly - 2 sets - 10 reps - Long Sitting Ankle Dorsiflexion with Anchored Resistance  - 2 x daily - 7 x weekly - 2 sets - 10 reps - Long Sitting Ankle Inversion with Resistance  - 2 x daily - 7 x weekly - 2 sets - 10 reps   ASSESSMENT:   CLINICAL IMPRESSION: Advanced difficulty of stepping tasks by raising height which did aggravate L ankle symptoms.  Unable to tolerate runners step from 6 in block and reverted to 4 in.  Added tandem stance on airex to compliment SLS.  Patient has met ROM goals and had reached  a functional level considering existing co-morbidities.  Recommend DC at next session.     OBJECTIVE IMPAIRMENTS Abnormal gait, decreased activity tolerance, decreased balance, decreased endurance, decreased knowledge of condition, decreased mobility, difficulty walking, decreased ROM, decreased strength, impaired flexibility, impaired sensation, impaired tone, and pain.    ACTIVITY LIMITATIONS carrying, lifting, bending, standing, squatting, stairs, transfers, dressing, and caring for others   PARTICIPATION LIMITATIONS: meal prep, cleaning, laundry, shopping, and community activity   PERSONAL FACTORS Education, Past/current experiences, Time since onset of injury/illness/exacerbation, and 1 comorbidity: Sickle cell  anemia  are also affecting patient's functional outcome.    REHAB POTENTIAL: Good   CLINICAL DECISION MAKING: Stable/uncomplicated   EVALUATION COMPLEXITY: Low     GOALS: Goals reviewed with patient? No   SHORT TERM GOALS: Target date: 12/25/2021  Pt will be I and compliant with initial HEP. Baseline: Patient able to demo to PT w/o need of cuing Goal status: Met   3.  Pt will increase LEFS score to 28/80 indicating minimal detectable change Baseline: 12/11/21 17/80,  Goal status: 12/31/2021, 29/80    LONG TERM GOALS: Target date: 01/22/2022    Pt will be I and compliant with comprhensiveHEP. Baseline:  Goal status: ongoing as HEP is evolving   2.  Increase LEFS to 56/80 indicating minimal detectable change Baseline: 12/11/21 17/80  Goal status: ongoing per target date   3.  Pt will report <2/10 pain at baseline  Baseline: 4/10 baseline pain Goal status: Not met   4.  Pt will improve DF to neutral on L LE. Baseline: 12/11/21 -2d DF PROM noted at 0/90d knee position; 01/08/22 -2d AROM, 0d PROM Goal status: Met   5.  Pt will be able to ascend/ descend stairs with alternating gait patten.  Baseline: 12/11/21 Able to ascend with [facility] and single rail alternating but descends with step to pattern Goal status: Pt is able to perform alternating pattern, but has increased pain. Met   PLAN: PT FREQUENCY: 2x/week   PT DURATION: 2 weeks   PLANNED INTERVENTIONS: Therapeutic exercises, Therapeutic activity, Neuromuscular re-education, Balance training, Gait training, Patient/Family education, Joint manipulation, Joint mobilization, Stair training, Orthotic/Fit training, Aquatic Therapy, Dry Needling, Electrical stimulation, Cryotherapy, Moist heat, Manual lymph drainage, Compression bandaging, scar mobilization, Taping, Traction, Ultrasound, Manual therapy, and Re-evaluation   PLAN FOR NEXT SESSION: Assess for DC as progress has plateaued due to underlying chronic degenerative  changes     Lanice Shirts, PT 01/08/2022, 11:29 AM   Check all possible CPT codes: 97164 - PT Re-evaluation, 97110- Therapeutic Exercise, [phone]- Neuro Re-education, [phone] - Gait Training, [phone] - Manual Therapy, 97530 - Therapeutic Activities, and 97535 - Self Care     If treatment provided at initial evaluation, no treatment charged due to lack of authorization.

## 2022-01-10 ENCOUNTER — Ambulatory Visit: Payer: Medicaid Other

## 2022-01-10 ENCOUNTER — Encounter (HOSPITAL_BASED_OUTPATIENT_CLINIC_OR_DEPARTMENT_OTHER): Payer: Medicaid Other | Admitting: General Surgery

## 2022-01-10 DIAGNOSIS — M25672 Stiffness of left ankle, not elsewhere classified: Secondary | ICD-10-CM

## 2022-01-10 DIAGNOSIS — R2689 Other abnormalities of gait and mobility: Secondary | ICD-10-CM

## 2022-01-10 DIAGNOSIS — M6281 Muscle weakness (generalized): Secondary | ICD-10-CM

## 2022-01-10 DIAGNOSIS — M25572 Pain in left ankle and joints of left foot: Secondary | ICD-10-CM

## 2022-01-10 DIAGNOSIS — L97818 Non-pressure chronic ulcer of other part of right lower leg with other specified severity: Secondary | ICD-10-CM | POA: Diagnosis not present

## 2022-01-10 NOTE — Progress Notes (Signed)
AILEENA, IGLESIA ([id number]) Visit Report for 01/10/2022 Chief Complaint Document Details Patient Name: Date of Service: [name] 01/10/2022 10:45 A M Medical Record Number: [id number] Patient Account Number: [id number] Date of Birth/Sex: Treating RN: 1971-04-30 (51 y.o. F) Primary Care Provider: [name] Other Clinician: Referring Provider: Treating Provider/Extender: Elyse Jarvis Weeks in Treatment: 39 Information Obtained from: Patient Chief Complaint the patient is here for evaluation of her bilateral lower extremity sickle cell ulcers 04/17/2021; patient comes in for substantial wounds on the right and left lower leg Electronic Signature(s) Signed: 01/10/2022 12:17:10 PM By: Fredirick Maudlin MD FACS Entered By: Fredirick Maudlin on 01/10/2022 12:17:10 -------------------------------------------------------------------------------- Debridement Details Patient Name: Date of Service: [name], [name] 01/10/2022 10:45 A M Medical Record Number: [id number] Patient Account Number: [id number] Date of Birth/Sex: Treating RN: 1970/06/21 (51 y.o. [name] Primary Care Provider: [name] Other Clinician: Referring Provider: Treating Provider/Extender: Elyse Jarvis Weeks in Treatment: 38 Debridement Performed for Assessment: Wound #21 Right,Medial Ankle Performed By: Physician Fredirick Maudlin, MD Debridement Type: Debridement Severity of Tissue Pre Debridement: Fat layer exposed Level of Consciousness (Pre-procedure): Awake and Alert Pre-procedure Verification/Time Out Yes - 11:30 Taken: Start Time: 11:30 Pain Control: Lidocaine 5% topical ointment T Area Debrided (L x W): otal 7.2 (cm) x 3.5 (cm) = 25.2 (cm) Tissue and other material debrided: Non-Viable, Slough, Slough Level: Non-Viable Tissue Debridement Description: Selective/Open Wound Instrument: Curette Bleeding: Minimum Hemostasis Achieved:  Pressure End Time: 11:32 Procedural Pain: 0 Post Procedural Pain: 0 Response to Treatment: Procedure was tolerated well Level of Consciousness (Post- Awake and Alert procedure): Post Debridement Measurements of Total Wound Length: (cm) 7.2 Width: (cm) 3.5 Depth: (cm) 0.1 Volume: (cm) 1.979 Character of Wound/Ulcer Post Debridement: Improved Severity of Tissue Post Debridement: Fat layer exposed Post Procedure Diagnosis Same as Pre-procedure Electronic Signature(s) Signed: 01/10/2022 12:47:53 PM By: Fredirick Maudlin MD FACS Signed: 01/10/2022 5:51:42 PM By: Dellie Catholic RN Entered By: Dellie Catholic on 01/10/2022 11:34:21 -------------------------------------------------------------------------------- Debridement Details Patient Name: Date of Service: [name], [name] 01/10/2022 10:45 A M Medical Record Number: [id number] Patient Account Number: [id number] Date of Birth/Sex: Treating RN: 20-Dec-1970 (51 y.o. [name] Primary Care Provider: [name] Other Clinician: Referring Provider: Treating Provider/Extender: Elyse Jarvis Weeks in Treatment: 38 Debridement Performed for Assessment: Wound #17 Right,Lateral Lower Leg Performed By: Physician Fredirick Maudlin, MD Debridement Type: Debridement Level of Consciousness (Pre-procedure): Awake and Alert Pre-procedure Verification/Time Out Yes - 11:30 Taken: Start Time: 11:30 Pain Control: Lidocaine 5% topical ointment T Area Debrided (L x W): otal 11 (cm) x 9 (cm) = 99 (cm) Tissue and other material debrided: Non-Viable, Slough, Slough Level: Non-Viable Tissue Debridement Description: Selective/Open Wound Instrument: Curette Bleeding: Minimum Hemostasis Achieved: Pressure End Time: 11:32 Procedural Pain: 0 Post Procedural Pain: 0 Response to Treatment: Procedure was tolerated well Level of Consciousness (Post- Awake and Alert procedure): Post Debridement Measurements of Total  Wound Length: (cm) 11 Width: (cm) 9 Depth: (cm) 0.1 Volume: (cm) 7.775 Character of Wound/Ulcer Post Debridement: Improved Post Procedure Diagnosis Same as Pre-procedure Electronic Signature(s) Signed: 01/10/2022 12:47:53 PM By: Fredirick Maudlin MD FACS Signed: 01/10/2022 5:51:42 PM By: Dellie Catholic RN Entered By: Dellie Catholic on 01/10/2022 12:22:07 -------------------------------------------------------------------------------- HPI Details Patient Name: Date of Service: [name], [name] 01/10/2022 10:45 A M Medical Record Number: [id number] Patient Account Number: [id number] Date of Birth/Sex: Treating RN: 06/03/1970 (51 y.o. F) Primary Care Provider: [name] Other Clinician: Referring  Provider: Treating Provider/Extender: Elyse Jarvis Weeks in Treatment: 38 History of Present Illness Location: medial and lateral ankle region on the right and left medial malleolus Quality: Patient reports experiencing a shooting pain to affected area(s). Severity: Patient states wound(s) are getting worse. Duration: right lower extremity bimalleolar ulcers have been present for approximately 2 years; the rright meedial malleolus ulcer has been there proximally 6 months Timing: Pain in wound is constant (hurts all the time) Context: The wound would happen gradually ssociated Signs and Symptoms: Patient reports having increase discharge. A HPI Description: 51 year old patient with a history of sickle cell anemia who was last seen by me with ulceration of the right lower extremity above the ankle and was referred to Dr. Leland Johns for a surgical debridement as I was unable to do anything in the office due to excruciating pain. At that stage she was referred from the plastic surgery service to dermatology who treated her for a skin infection with doxycycline and then Levaquin and a local antibiotic ointment. I understand the patient has since developed ulceration on the  left ankle both medial and lateral and was now referred back to the wound center as dermatology has finished the management. I do not have any notes from the dermatology department Old notes: 51 year old patient with a history of sickle cell anemia, pain bilateral lower extremities, right lower extremity ulcer and has a history of receiving a skin graft( Theraskin) several months ago. She has been visiting the wound center [facility] and was seen by Dr. Dellia Nims and Dr. Leland Johns. after prolonged conservator therapy between July 2016 and January 2017. She had been seen by the plastic surgeon and taken to the OR for debridement and application of Theraskin. She had 3 applications of Theraskin and was then treated with collagen. Prior to that she had a history of similar problems in 2014 and was treated conservatively. Had a reflux study done for the right lower extremity in August 2016 without reflux or DVT . Past medical history significant for sickle cell disease, anemia, leg ulcers, cholelithiasis,and has never been a smoker. Once the patient was discharged on the wound center she says within 2 or 3 weeks the problems recurred and she has been treating it conservatively. since I saw her 3 weeks ago at [facility] she has been unable to get her dressing material but has completed a course of doxycycline. 6/7/ 2017 -- lower extremity venous duplex reflux evaluation was done No evidence of SVT or DVT in the RLL. No venous incompetence in the RLL. No further vascular workup is indicated at this time. She was seen by Dr. [name], on 10/04/2015. She agreed with the plan of taking her to the OR for debridement and application of theraskin and would also take biopsies to rule out pyoderma gangrenosum. Follow-up note dated May 31 received and she was status post application of Theraskin to multiple ulcers around the right ankle. Pathology did not show evidence of malignancy or pyoderma gangrenosum.  She would continue to see as in the wound clinic for further care and see Dr. Leland Johns as needed. The patient brought the biopsy report and it was consistent with stasis ulcer no evidence of malignancy and the comment was that there was some adjacent neovascularization, fibrosis and patchy perivascular chronic inflammation. 11/15/2015 -- today we applied her first application of Theraskin 11/30/15; TheraSkin #2 12/13/2015 -- she is having a lot of pain locally and is here for possible application of a theraskin today. 01/16/2016 --  the patient has significant pain and has noticed despite in spite of all local care and oral pain medication. It is impossible to debride her in the office. 02/06/2016 -- I do not see any notes from Dr. Iran Planas( the patient has not made a call to the office know as she heard from them) and the only visit to recently was with her PCP Dr. Danella Penton -- I saw her on 01/16/2016 and prescribed 90 tablets of oxycodone 10 mg and did lab work and screening for HIV. the HIV was negative and hemoglobin was 6.3 with a WBC count of 14.9 and hematocrit of 17.8 with platelets of 561. 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 The bimalleolar ulcers to the right lower extremity have been present for approximately 2 years, the left medial malleolus ulcer has been present approximately 6 months. She has followed with Dr.Thimmappa in the past and has had a total of 3 applications of Theraskin (01/2015, 09/2015, 06/17/16). She has also followed with Dr. Con Memos here in the clinic and has received 2 applications of TheraSkin (11/10/15, 11/30/15). The patient does experience chronic, and is not amenable to debridement. 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 She is not currently on any antibiotic therapy and has not been treated with any  recently. 07/17/2016 -- was seen by Dr. Iran Planas of plastic surgery who saw her 2 weeks postop application of Theraskin #3. She had removed her dressing and asked her to apply silver alginate on alternate days and follow-up back with the wound center. Future debridements and application of skin substitute would have to be done in the hospital due to her high risk for anesthesia. READMISSION 04/17/2021 Patient is now a 51 year old woman that we have had in this clinic for a prolonged period of time and 2016-2017 and then again for 2 visits in February 2018. At that point she had wounds on the right lower leg predominantly medial. She had also been seen by plastic surgery Dr. Leland Johns who I believe took her to the OR for operative debridement and application of TheraSkin in 2017. After she left our clinic she was followed for a very prolonged period of time in the wound care center in [facility] who then referred her ultimately to [facility] where she was seen by Dr. Vernona Rieger. Again taken her to the OR for skin grafting which apparently did not take. She had multiple other attempts at dressings although I have not really looked over all of these notes in great detail. She has not been seen in a wound care center in about a year. She states over the last year in addition to her right lower leg she has developed wounds on the left lower leg quite extensive. She is using Xeroform to all of these wounds without really any improvement. She also has Medicaid which does not cover wound products. The patient has had vascular work-ups in the past including most recently on 03/28/2021 showing biphasic waveforms on the right triphasic at the PTA and biphasic at the dorsalis pedis on the left. She was unable to tolerate any degree of compression to do ABIs. Unfortunately TBI's were also not done. She had venous reflux studies done in 2017. This did not show any evidence of a DVT or SVT and no venous incompetence was  noted in the right leg at the time this  was the only side with the wound As noted I did not look all over her old records. She apparently had a course of HBO and [facility] although I am not sure what the indication would have been. In any case she developed seizures and terminated treatment earlier. She is generally much more disabled than when we last saw her in clinic. She can no longer walk pretty much wheelchair-bound because predominantly of pain in the left hip. 04/24/2021; the patient tolerated the wraps we put on. We used Santyl and Hydrofera Blue under compression. I brought her back for a nurse visit for a change in dressing. With Medicaid we will have a hard time getting anything paid for and hence the need for compression. She arrives in clinic with all the wounds looking somewhat better in terms of surface 12/20; circumferential wound on the right from the lateral to the medial. She has open areas on the left medial and left lateral x2 on all of this with the same surface. This does not look completely healthy although she does have some epithelialization. She is not complaining of a lot of pain which is unusual for her sickle ulcers. I have not looked over her extensive records from [facility]. She had recent arterial studies and has a history of venous reflux studies I will need to look these over although I do not believe she has significant arterial disease 2023 05/22/2021; patient's wound areas measure slightly smaller. Still a lot of drainage coming from the right we have been using Hydrofera Blue and Santyl with some improvement in the wound surfaces. She tells me she will be getting transfused later in the week for her underlying sickle cell anemia I have looked over her recent arterial studies which were done in the fall. This was in November and showed biphasic and triphasic waveforms but she could not tolerate ABIs because of pressure and unfortunately TBI's were not done. She has  not had recent venous reflux studies that I can see 1/10; not much change about the same surface area. This has a yellowish surface to it very gritty. We have been using Santyl and Hydrofera Blue for a prolonged period. Culture I did last week showed methicillin sensitive staph aureus "rare". Our intake nurse reports greenish drainage which may be the Hydrofera Blue itself 1/17; wounds are continue to measure smaller although I am not sure about the accuracy here. Especially the areas on the right are covered in what looks to be a nonviable surface although she does have some epithelialization. Similarly she has areas on the left medial and left lateral ankle area which appear to have a better surface and perhaps are slightly smaller. We have been using Santyl and Hydrofera Blue. She cannot tolerate mechanical debridements She went for her reflux studies which showed significant reflux at the greater saphenous vein at the saphenofemoral junction as well as the greater saphenous vein in the proximal calf on the left she had reflux in the thigh and the common femoral vein and supra vein Fishel vein reflux in the greater saphenous vein. I will have vein and vascular look at this. My thoughts have been that these are likely sickle wounds. I looked through her old records from [facility] wound care center and then when she graduated to [facility] wound care center where she saw Dr. Zigmund Daniel and Dr. Vernona Rieger. Although I can see she had reflux studies done I do not see that she actually saw a vein and vascular.  I went over the fact that she had operative debridements and actual skin grafting that did not take. I do not think these wounds have ever really progressed towards healing 1/31;Substantial wounds on the right ankle area. Hyper granulated very gritty adherent debris on the surface. 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 On the left she has no DVT or SVT deep vein reflux in the common femoral vein. Superficial vein reflux in the greater saphenous vein on although the vein diameter is not really all that large. I do not think there is anything that can be done with these although I am going to send her for consultation to vein and vascular. 2/7; Wound exam; substantial wound area on the right posterior ankle area and areas on the left medial ankle and left lateral ankle. I was able to debride the left medial ankle last week fairly aggressively and it is back this week to a completely nonviable surface She will see vascular surgery this Friday and I would like them to review the venous studies and also any comments on her arterial status. If they do not see an issue here I am going to refer her to plastic surgery for an operative debridement perhaps intraoperative ACell or Integra. Eventually she will require a deep tissue culture again 2/14; substantial wound area on the right posterior ankle, medial ankle. We have been using silver alginate The patient was seen by vein and vascular she had both venous reflux studies and arterial studies. 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 She had no evidence of superficial venous thrombosis. She did have superficial vein reflux at the saphenofemoral junction and the greater saphenous vein. On the left no evidence of a DVT no evidence of superficial venous throat thrombosis she did have deep vein reflux in the common femoral vein and superficial vein reflux in the greater saphenous vein but these were not felt to be  amenable to ablation. In terms of arterial studies she had triphasic and wife biphasic wave waveforms bilaterally not felt to have a significant arterial issue. I do not get the feeling that they felt that any part of her nonhealing wounds were related to either arterial or venous issues. They did note that she had venous reflux at the right at the [facility] and [facility]. And also on the left there were reflux in the deep system at the common femoral vein and greater saphenous vein in the proximal thigh. Nothing amenable to ablation. 2/20; she is making some decent progress on the right where there is nice skin between the 2 open areas on the right ankle. The surfaces here do not look viable yet there is some surrounding epithelialization. She still has a small area on the left medial ankle area. Hyper-granulated Jody's away always 2/28 patient has an appointment with plastic surgery on 3/8. We will see her back on 3/9. She may have to call us to get the area redressed. We've been using Santyl under silver alginate. We made a nice improvement on the left  medial ankle. The larger wounds on the right also looks somewhat better in terms of epithelialization although I think they could benefit from an aggressive debridement if plastic surgery would be willing to do that. Perhaps placement of Integra or a cell 07/26/2021: She saw Dr. Claudia Desanctis yesterday. He raised the question as to whether or not this might be pyoderma and wanted to wait until that question was answered by dermatology before proceeding with any sort of operative debridement. We have continue to use Santyl under silver alginate with Kerlix and Coban wraps. Overall, her wounds appear to be continuing to contract and epithelialize, with some granulation tissue present. There continues to be some slough on all wound surfaces. 08/09/2021: She has not been able to get an appointment with dermatology because apparently the offices in [place] do  not accept Medicaid. She is looking into whether or not she can be seen at the main [facility] dermatology clinic. This is necessary because plastic surgery is concerned that her wounds might represent pyoderma and they did not want to do any procedure until that was clarified. We have been using Santyl under silver alginate with Kerlix and Coban wraps. Today, there was a greater amount of drainage on her dressings with a slight green discoloration and significant odor. Despite this, her wounds continue to contract and epithelialize. There is pale granulation tissue present and actually, on the left medial ankle, the granulation tissue is a bit hypertrophic. 08/16/2021: Last week, I took a culture and this grew back rare methicillin-resistant Staph aureus and rare corynebacterium. The MRSA was sensitive to gentamicin which we began applying topically on an empiric basis. This week, her wounds are a bit smaller and the drainage and odor are less. Her primary care provider is working on assisting the patient with a dermatology evaluation. She has been in silver alginate over the gentamicin that was started last week along with Kerlix and Coban wraps. 08/23/2021: Because she has Medicaid, we have been unable to get her into see any dermatologist in the [place] to rule out pyoderma gangrenosum, which was a requirement from plastic surgery prior to any sort of debridement and grafting. Despite this, however, all of her wounds continue to get smaller. The wound on her left medial ankle is nearly closed. There is no odor from the wounds, although she still accumulates a modest amount of drainage on her dressings. 08/30/2021: The lateral right ankle wound and the medial left ankle wound are a bit smaller today. The medial right ankle wound is about the same size. They are less tender. We have still been unable to get her into dermatology. 09/06/2021: All of the wounds are about the same size today. She  continues to endorse minimal pain. I communicated with Dr. Claudia Desanctis in plastic surgery regarding our issues getting a dermatology appointment; he was out of town but indicated that he would look into perhaps performing the biopsy in his office and will have his office contact her. 09/14/2021: The patient has an appointment in dermatology, but it is not until October. Her wounds are roughly the same; she continues to have very thick purulent-looking drainage on her dressings. 09/20/2021: The left medial wound is nearly closed and just has a bit of accumulated eschar on the surface. The right medial and lateral ankle wounds are perhaps a little bit smaller. They continue to have a very pale surface with accumulation of thin slough. PCR culture done last week returned with MRSA but fairly  low levels. I did not think [employer] was indicated based on this. She is getting topical mupirocin with Prisma silver collagen. 10/04/2021: The patient was not seen in clinic last week due to childcare coverage issues. In the interim, the left medial leg wound has closed. The right sided leg wounds are smaller. There is more granulation tissue coming through, particularly on the lateral wound. The surface remains somewhat gritty. We have been applying topical mupirocin and Prisma silver collagen. 10/11/2021: The left medial leg wound remains closed. She does complain of some anesthetic sensation to the area. Both of the right-sided leg wounds are smaller but still have accumulated slough. 10/18/2021: Both right-sided leg wounds are minimally smaller this week. She still continues to accumulate slough and has thick drainage on her dressings. 10/23/2021: Both wounds continue to contract. There is still slough buildup. She has been approved for a keratin-based skin substitute trial product but it will not be available until next week. 10/30/2021: The wounds are about the same to perhaps slightly smaller. There is still continued slough  buildup. Unfortunately, the rep for the keratin based product did not show up today and did not answer his phone when called. 11/08/2021: The wounds are little bit smaller today. She continues to have thick drainage but the surfaces are relatively clean with just a little bit of slough accumulation. She reported to me today that she is unable to completely flex her left ankle and on examination it seems this is potentially related to scar tissue from her wounds. We do have the ProgenaMatrix trial product available for her today. 11/15/2021: Both wounds are smaller today. There is some slough accumulation on the surfaces, but the medial wound, in particular looks like it is filling in and is less deep. She did hear from physical therapy and she is going to start working with them on July 11. She is here for her second application of the trial skin substitute, ProgenaMatrix. 11/22/2021: Both wounds continue to contract, the medial more dramatically than the lateral. Both wounds have a layer of slough on the surface, but underneath this, the gritty fibrous tissue has a little bit more of a pink cast to it rather than being as pale as it has been. 11/29/2021: The wounds are roughly the same size this week, perhaps a millimeter or 2 smaller. The medial wound has filled in and is nearly flush with the surrounding skin surface. She continues to have a lot of slough accumulation on both surfaces. 12/06/2021: No significant change to her wounds, but she has a new opening on her dorsal foot, just distal to the right lateral ankle wound. The area on her left medial ankle that reopened looks a little bit larger today. She has quite a bit of pain associated with the new wound. 12/12/2021: Her wounds look about the same but the new opening on her right lateral dorsal foot is a little bit bigger. She continues to have a fair amount of pain with this wound. 12/26/2021: The left medial ankle wound is tiny and superficial. She  has 2 areas of crusting on her left lateral ankle, however, that appear to be threatening to open again. Her right medial ankle wound is a little bit smaller today but still continues to accumulate thick rubbery slough. The new dorsal foot wound is exquisitely painful but there is no odor or purulent drainage. No erythema or induration. The right lateral ankle wound looks about the same today, again with thick rubbery slough. 01/03/2022: The  left medial ankle wound has closed again. Both right ankle wounds appear to be about the same size with thick rubbery slough. The dorsal foot wound on the right continues to be quite painful and she stated that she did not want any debridement of that site today. 01/10/2022: No real change to any of her wounds. She continues to accumulate thick slough. The dorsal foot wound has merged with the lateral malleolar wound. She is experiencing significant pain in the dorsal foot portion of the ulcer. Electronic Signature(s) Signed: 01/10/2022 12:19:39 PM By: Fredirick Maudlin MD FACS Entered By: Fredirick Maudlin on 01/10/2022 12:19:39 -------------------------------------------------------------------------------- Physical Exam Details Patient Name: Date of Service: [name], [name] 01/10/2022 10:45 A M Medical Record Number: [id number] Patient Account Number: [id number] Date of Birth/Sex: Treating RN: [birth year] (51 y.o. F) Primary Care Provider: [name] Other Clinician: Referring Provider: Treating Provider/Extender: Elyse Jarvis Weeks in Treatment: 32 Constitutional . . . . No acute distress.Marland Kitchen Respiratory Normal work of breathing on room air.. Notes 01/10/2022: No real change to any of her wounds. She continues to accumulate thick slough. The dorsal foot wound has merged with the lateral malleolar wound. She is experiencing significant pain in the dorsal foot portion of the ulcer. Electronic Signature(s) Signed: 01/10/2022  12:20:31 PM By: Fredirick Maudlin MD FACS Entered By: Fredirick Maudlin on 01/10/2022 12:20:30 -------------------------------------------------------------------------------- Physician Orders Details Patient Name: Date of Service: [name], [name] 01/10/2022 10:45 A M Medical Record Number: [id number] Patient Account Number: [id number] Date of Birth/Sex: Treating RN: 14-Oct-1970 (51 y.o. 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 - Dr. Celine Ahr and Roselyn Reef Rm 4 01/16/22 at 8:45am Return A Anesthetic (In clinic) Topical Lidocaine 5% applied to wound bed (In clinic) Topical Lidocaine 4% applied to wound bed - In Clinic Bathing/ Shower/ Hygiene May shower with protection but do not get wound dressing(s) wet. - Can get cast protector bags at [facility] or [facility] Edema Control - Lymphedema / SCD / Other Elevate legs to the level of the heart or above for 30 minutes daily and/or when sitting, a frequency of: - throughout the day Avoid standing for long periods of time. Exercise regularly Additional Orders / Instructions Follow Nutritious Diet Wound Treatment Wound #17 - Lower Leg Wound Laterality: Right, Lateral Cleanser: Soap and Water 1 x Per Week/30 Days Discharge Instructions: May shower and wash wound with dial antibacterial soap and water prior to dressing change. Cleanser: Wound Cleanser 1 x Per Week/30 Days Discharge Instructions: Cleanse the wound with wound cleanser prior to applying a clean dressing using gauze sponges, not tissue or cotton balls. Peri-Wound Care: Triamcinolone 15 (g) 1 x Per  Week/30 Days Discharge Instructions: Use triamcinolone 15 (g) as directed Peri-Wound Care: Sween Lotion (Moisturizing lotion) 1 x Per Week/30 Days Discharge Instructions: Apply moisturizing lotion as directed Topical: Mupirocin Ointment 1 x Per Week/30 Days Discharge Instructions: Apply Mupirocin (Bactroban) as instructed Prim Dressing: Promogran Prisma Matrix, 4.34 (sq in) (silver collagen) 1 x Per Week/30 Days ary Discharge Instructions: Moisten collagen with saline or hydrogel Secondary Dressing: ABD Pad, 5x9 1 x Per Week/30 Days Discharge Instructions: Apply over primary dressing as directed. Secondary Dressing: Zetuvit Plus 4x8 in  1 x Per Week/30 Days Discharge Instructions: Apply over primary dressing as directed. Compression Wrap: Kerlix Roll 4.5x3.1 (in/yd) 1 x Per Week/30 Days Discharge Instructions: Apply Kerlix and Coban compression as directed. Compression Wrap: Coban Self-Adherent Wrap 4x5 (in/yd) 1 x Per Week/30 Days Discharge Instructions: Apply over Kerlix as directed. Wound #21 - Ankle Wound Laterality: Right, Medial Cleanser: Soap and Water 1 x Per Week/30 Days Discharge Instructions: May shower and wash wound with dial antibacterial soap and water prior to dressing change. Cleanser: Wound Cleanser 1 x Per Week/30 Days Discharge Instructions: Cleanse the wound with wound cleanser prior to applying a clean dressing using gauze sponges, not tissue or cotton balls. Peri-Wound Care: Triamcinolone 15 (g) 1 x Per Week/30 Days Discharge Instructions: Use triamcinolone 15 (g) as directed Peri-Wound Care: Sween Lotion (Moisturizing lotion) 1 x Per Week/30 Days Discharge Instructions: Apply moisturizing lotion as directed Topical: Mupirocin Ointment 1 x Per Week/30 Days Discharge Instructions: Apply Mupirocin (Bactroban) as instructed Prim Dressing: Promogran Prisma Matrix, 4.34 (sq in) (silver collagen) 1 x Per Week/30 Days ary Discharge Instructions: Moisten collagen with  saline or hydrogel Secondary Dressing: ABD Pad, 5x9 1 x Per Week/30 Days Discharge Instructions: Apply over primary dressing as directed. Secondary Dressing: Zetuvit Plus 4x8 in 1 x Per Week/30 Days Discharge Instructions: Apply over primary dressing as directed. Compression Wrap: Kerlix Roll 4.5x3.1 (in/yd) 1 x Per Week/30 Days Discharge Instructions: Apply Kerlix and Coban compression as directed. Compression Wrap: Coban Self-Adherent Wrap 4x5 (in/yd) 1 x Per Week/30 Days Discharge Instructions: Apply over Kerlix as directed. Electronic Signature(s) Signed: 01/10/2022 12:47:53 PM By: Fredirick Maudlin MD FACS Entered By: Fredirick Maudlin on 01/10/2022 12:20:47 -------------------------------------------------------------------------------- Problem List Details Patient Name: Date of Service: [name], [name] 01/10/2022 10:45 A M Medical Record Number: [id number] Patient Account Number: [id number] Date of Birth/Sex: Treating RN: April 02, 1971 (51 y.o. F) Primary Care Provider: [name] Other Clinician: Referring Provider: Treating Provider/Extender: Elyse Jarvis Weeks in Treatment: 15 Active Problems ICD-10 Encounter Code Description Active Date MDM Diagnosis L97.818 Non-pressure chronic ulcer of other part of right lower leg with other specified 04/17/2021 No Yes severity 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 04/17/2021 No Yes Inactive Problems ICD-10 Code Description Active Date Inactive Date L97.828 Non-pressure chronic ulcer of other part of left lower leg with other specified severity 04/17/2021 04/17/2021 Resolved Problems Electronic Signature(s) Signed: 01/10/2022 12:03:33 PM By: Fredirick Maudlin MD FACS Entered By: Fredirick Maudlin on 01/10/2022 12:03:33 -------------------------------------------------------------------------------- Progress Note Details Patient Name: Date of Service: [name], [name] 01/10/2022 10:45 A M Medical Record  Number: [id number] Patient Account Number: [id number] Date of Birth/Sex: Treating RN: May 24, 1970 (51 y.o. F) Primary Care Provider: [name] Other Clinician: Referring Provider: Treating Provider/Extender: Elyse Jarvis Weeks in Treatment: 29 Subjective Chief Complaint Information obtained from Patient the patient is here for evaluation of her bilateral lower extremity sickle cell ulcers 04/17/2021; patient comes in for substantial wounds on the right and left lower leg History of Present Illness (HPI) The following HPI elements were documented for the patient's wound: Location: medial and lateral ankle region on the right and left medial malleolus Quality: Patient reports experiencing a shooting pain to affected area(s). Severity: Patient states wound(s) are getting worse. Duration: right lower extremity bimalleolar ulcers have been present for approximately 2 years; the rright meedial malleolus ulcer has been there proximally 6 months Timing: Pain in wound is constant (hurts all the time) Context: The wound would happen gradually Associated Signs and  Symptoms: Patient reports having increase discharge. 51 year old patient with a history of sickle cell anemia who was last seen by me with ulceration of the right lower extremity above the ankle and was referred to Dr. Leland Johns for a surgical debridement as I was unable to do anything in the office due to excruciating pain. At that stage she was referred from the plastic surgery service to dermatology who treated her for a skin infection with doxycycline and then Levaquin and a local antibiotic ointment. I understand the patient has since developed ulceration on the left ankle both medial and lateral and was now referred back to the wound center as dermatology has finished the management. I do not have any notes from the dermatology department Old notes: 51 year old patient with a history of sickle cell anemia, pain  bilateral lower extremities, right lower extremity ulcer and has a history of receiving a skin graft( Theraskin) several months ago. She has been visiting the wound center [facility] [address] and was seen by Dr. Dellia Nims and Dr. Leland Johns. after prolonged conservator therapy between July 2016 and January 2017. She had been seen by the plastic surgeon and taken to the OR for debridement and application of Theraskin. She had 3 applications of Theraskin and was then treated with collagen. Prior to that she had a history of similar problems in 2014 and was treated conservatively. Had a reflux study done for the right lower extremity in August 2016 without reflux or DVT . Past medical history significant for sickle cell disease, anemia, leg ulcers, cholelithiasis,and has never been a smoker. Once the patient was discharged on the wound center she says within 2 or 3 weeks the problems recurred and she has been treating it conservatively. since I saw her 3 weeks ago at [facility] she has been unable to get her dressing material but has completed a course of doxycycline. 6/7/ 2017 -- lower extremity venous duplex reflux evaluation was done oo No evidence of SVT or DVT in the RLL. No venous incompetence in the RLL. No further vascular workup is indicated at this time. She was seen by Dr. [name], on 10/04/2015. She agreed with the plan of taking her to the OR for debridement and application of theraskin and would also take biopsies to rule out pyoderma gangrenosum. Follow-up note dated May 31 received and she was status post application of Theraskin to multiple ulcers around the right ankle. Pathology did not show evidence of malignancy or pyoderma gangrenosum. She would continue to see as in the wound clinic for further care and see Dr. Leland Johns as needed. The patient brought the biopsy report and it was consistent with stasis ulcer no evidence of malignancy and the comment was that there was some  adjacent neovascularization, fibrosis and patchy perivascular chronic inflammation. 11/15/2015 -- today we applied her first application of Theraskin 11/30/15; TheraSkin #2 12/13/2015 -- she is having a lot of pain locally and is here for possible application of a theraskin today. 01/16/2016 -- the patient has significant pain and has noticed despite in spite of all local care and oral pain medication. It is impossible to debride her in the office. 02/06/2016 -- I do not see any notes from Dr. Iran Planas( the patient has not made a call to the office know as she heard from them) and the only visit to recently was with her PCP Dr. Danella Penton -- I saw her on 01/16/2016 and prescribed 90 tablets of oxycodone 10 mg and did lab work and screening for  HIV. the HIV was negative and hemoglobin was 6.3 with a WBC count of 14.9 and hematocrit of 17.8 with platelets of 561. 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 The bimalleolar ulcers to the right lower extremity have been present for approximately 2 years, the left medial malleolus ulcer has been present approximately 6 months. She has followed with Dr.Thimmappa in the past and has had a total of 3 applications of Theraskin (01/2015, 09/2015, 06/17/16). She has also followed with Dr. Con Memos here in the clinic and has received 2 applications of TheraSkin (11/10/15, 11/30/15). The patient does experience chronic, and is not amenable to debridement. 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 She is not currently on any antibiotic therapy and has not been treated with any recently. 07/17/2016 -- was seen by Dr. Iran Planas of plastic surgery who saw her 2 weeks postop application of Theraskin #3. She had removed her dressing and asked her to apply silver alginate on alternate days and follow-up back with the  wound center. Future debridements and application of skin substitute would have to be done in the hospital due to her high risk for anesthesia. READMISSION 04/17/2021 Patient is now a 51 year old woman that we have had in this clinic for a prolonged period of time and 2016-2017 and then again for 2 visits in February 2018. At that point she had wounds on the right lower leg predominantly medial. She had also been seen by plastic surgery Dr. Leland Johns who I believe took her to the OR for operative debridement and application of TheraSkin in 2017. After she left our clinic she was followed for a very prolonged period of time in the wound care center in [facility] who then referred her ultimately to [facility] where she was seen by Dr. Vernona Rieger. Again taken her to the OR for skin grafting which apparently did not take. She had multiple other attempts at dressings although I have not really looked over all of these notes in great detail. She has not been seen in a wound care center in about a year. She states over the last year in addition to her right lower leg she has developed wounds on the left lower leg quite extensive. She is using Xeroform to all of these wounds without really any improvement. She also has Medicaid which does not cover wound products. The patient has had vascular work-ups in the past including most recently on 03/28/2021 showing biphasic waveforms on the right triphasic at the PTA and biphasic at the dorsalis pedis on the left. She was unable to tolerate any degree of compression to do ABIs. Unfortunately TBI's were also not done. She had venous reflux studies done in 2017. This did not show any evidence of a DVT or SVT and no venous incompetence was noted in the right leg at the time this was the only side with the wound As noted I did not look all over her old records. She apparently had a course of HBO and [facility] although I am not sure what the indication would have been. In any case  she developed seizures and terminated treatment earlier. She is generally much more disabled than when we last saw her in clinic. She can no longer walk pretty much wheelchair-bound because predominantly of pain in the left hip. 04/24/2021; the patient tolerated the wraps we put on. We used  Santyl and Hydrofera Blue under compression. I brought her back for a nurse visit for a change in dressing. With Medicaid we will have a hard time getting anything paid for and hence the need for compression. She arrives in clinic with all the wounds looking somewhat better in terms of surface 12/20; circumferential wound on the right from the lateral to the medial. She has open areas on the left medial and left lateral x2 on all of this with the same surface. This does not look completely healthy although she does have some epithelialization. She is not complaining of a lot of pain which is unusual for her sickle ulcers. I have not looked over her extensive records from [facility]. She had recent arterial studies and has a history of venous reflux studies I will need to look these over although I do not believe she has significant arterial disease 2023 05/22/2021; patient's wound areas measure slightly smaller. Still a lot of drainage coming from the right we have been using Hydrofera Blue and Santyl with some improvement in the wound surfaces. She tells me she will be getting transfused later in the week for her underlying sickle cell anemia I have looked over her recent arterial studies which were done in the fall. This was in November and showed biphasic and triphasic waveforms but she could not tolerate ABIs because of pressure and unfortunately TBI's were not done. She has not had recent venous reflux studies that I can see 1/10; not much change about the same surface area. This has a yellowish surface to it very gritty. We have been using Santyl and Hydrofera Blue for a prolonged period. Culture I did last week  showed methicillin sensitive staph aureus "rare". Our intake nurse reports greenish drainage which may be the Hydrofera Blue itself 1/17; wounds are continue to measure smaller although I am not sure about the accuracy here. Especially the areas on the right are covered in what looks to be a nonviable surface although she does have some epithelialization. Similarly she has areas on the left medial and left lateral ankle area which appear to have a better surface and perhaps are slightly smaller. We have been using Santyl and Hydrofera Blue. She cannot tolerate mechanical debridements She went for her reflux studies which showed significant reflux at the greater saphenous vein at the saphenofemoral junction as well as the greater saphenous vein in the proximal calf on the left she had reflux in the thigh and the common femoral vein and supra vein Fishel vein reflux in the greater saphenous vein. I will have vein and vascular look at this. My thoughts have been that these are likely sickle wounds. I looked through her old records from [facility] wound care center and then when she graduated to [facility] wound care center where she saw Dr. Zigmund Daniel and Dr. Vernona Rieger. Although I can see she had reflux studies done I do not see that she actually saw a vein and vascular. I went over the fact that she had operative debridements and actual skin grafting that did not take. I do not think these wounds have ever really progressed towards healing 1/31;Substantial wounds on the right ankle area. Hyper granulated very gritty adherent debris on the surface. 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 On the left she has no DVT or SVT deep vein reflux in the common femoral vein. Superficial vein reflux in the greater saphenous vein on although the vein diameter is not really all that large. I do not think there is anything that can be done with these although I am going to send her for consultation to vein and vascular. 2/7; Wound exam; substantial wound area on the right posterior ankle area and areas on the left medial ankle and left lateral ankle. I was able to debride the left medial ankle last week fairly aggressively and it is back this week to a completely nonviable surface She will see vascular surgery this Friday and I would like them to review the venous studies and also any comments on her arterial status. If they do not see an issue here I am going to refer her to plastic surgery for an operative debridement perhaps intraoperative ACell or Integra. Eventually she will require a deep tissue culture again 2/14; substantial wound area on the right posterior ankle, medial ankle. We have been using silver alginate The patient was seen by vein and vascular she had both venous reflux studies and arterial studies. 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 She had no evidence of superficial venous thrombosis. She did have superficial vein reflux at the saphenofemoral junction and the greater saphenous vein. On the left no evidence of a DVT no evidence of superficial venous throat thrombosis she did have deep vein reflux in the common femoral vein and superficial vein reflux in the greater saphenous vein but these were not felt to be amenable to ablation. In terms of arterial studies she had triphasic and wife biphasic wave waveforms bilaterally not felt to have a significant arterial issue. I do not get the feeling that they felt that any part of her nonhealing wounds were  related to either arterial or venous issues. They did note that she had venous reflux at the right at the [facility] and GCV. And also on the left there were reflux in the deep system at the common femoral vein and greater saphenous vein in the proximal thigh. Nothing amenable to ablation. 2/20; she is making some decent progress on the right where there is nice skin between the 2 open areas on the right ankle. The surfaces here do not look viable yet there is some surrounding epithelialization. She still has a small area on the left medial ankle area. Hyper-granulated Jody's away always 2/28 patient has an appointment with plastic surgery on 3/8. We will see her back on 3/9. She may have to call us to get the area redressed. We've been using Santyl under silver alginate. We made a nice improvement on the left medial ankle. The larger wounds on the right also looks somewhat better in terms of epithelialization although I think they could benefit from an aggressive debridement if plastic surgery would be willing to do that. Perhaps placement of Integra or a cell 07/26/2021: She saw Dr. Claudia Desanctis yesterday. He raised the question as to whether or not this might be pyoderma and wanted to wait until that question was answered by dermatology before proceeding with any sort of operative debridement. We have continue to use Santyl under silver alginate  with Kerlix and Coban wraps. Overall, her wounds appear to be continuing to contract and epithelialize, with some granulation tissue present. There continues to be some slough on all wound surfaces. 08/09/2021: She has not been able to get an appointment with dermatology because apparently the offices in [place] do not accept Medicaid. She is looking into whether or not she can be seen at the main [facility] dermatology clinic. This is necessary because plastic surgery is concerned that her wounds might represent pyoderma and they did not want to  do any procedure until that was clarified. We have been using Santyl under silver alginate with Kerlix and Coban wraps. Today, there was a greater amount of drainage on her dressings with a slight green discoloration and significant odor. Despite this, her wounds continue to contract and epithelialize. There is pale granulation tissue present and actually, on the left medial ankle, the granulation tissue is a bit hypertrophic. 08/16/2021: Last week, I took a culture and this grew back rare methicillin-resistant Staph aureus and rare corynebacterium. The MRSA was sensitive to gentamicin which we began applying topically on an empiric basis. This week, her wounds are a bit smaller and the drainage and odor are less. Her primary care provider is working on assisting the patient with a dermatology evaluation. She has been in silver alginate over the gentamicin that was started last week along with Kerlix and Coban wraps. 08/23/2021: Because she has Medicaid, we have been unable to get her into see any dermatologist in the [place] to rule out pyoderma gangrenosum, which was a requirement from plastic surgery prior to any sort of debridement and grafting. Despite this, however, all of her wounds continue to get smaller. The wound on her left medial ankle is nearly closed. There is no odor from the wounds, although she still accumulates a modest amount of drainage on her dressings. 08/30/2021: The lateral right ankle wound and the medial left ankle wound are a bit smaller today. The medial right ankle wound is about the same size. They are less tender. We have still been unable to get her into dermatology. 09/06/2021: All of the wounds are about the same size today. She continues to endorse minimal pain. I communicated with Dr. Claudia Desanctis in plastic surgery regarding our issues getting a dermatology appointment; he was out of town but indicated that he would look into perhaps performing the biopsy in his office and  will have his office contact her. 09/14/2021: The patient has an appointment in dermatology, but it is not until October. Her wounds are roughly the same; she continues to have very thick purulent-looking drainage on her dressings. 09/20/2021: The left medial wound is nearly closed and just has a bit of accumulated eschar on the surface. The right medial and lateral ankle wounds are perhaps a little bit smaller. They continue to have a very pale surface with accumulation of thin slough. 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 I did not think [employer] was indicated based on this. She is getting topical mupirocin with Prisma silver collagen. 10/04/2021: The patient was not seen in clinic last week due to childcare coverage issues. In the interim, the left medial leg wound has closed. The right sided leg wounds are smaller. There is more granulation tissue coming through, particularly on the lateral wound. The surface remains somewhat gritty. We have been applying topical mupirocin and Prisma silver collagen. 10/11/2021: The left medial leg wound remains closed. She  does complain of some anesthetic sensation to the area. Both of the right-sided leg wounds are smaller but still have accumulated slough. 10/18/2021: Both right-sided leg wounds are minimally smaller this week. She still continues to accumulate slough and has thick drainage on her dressings. 10/23/2021: Both wounds continue to contract. There is still slough buildup. She has been approved for a keratin-based skin substitute trial product but it will not be available until next week. 10/30/2021: The wounds are about the same to perhaps slightly smaller. There is still continued slough buildup. Unfortunately, the rep for the keratin based product did not show up today and did not answer his phone when called. 11/08/2021: The wounds are little bit smaller today. She continues to have thick drainage but the surfaces are  relatively clean with just a little bit of slough accumulation. She reported to me today that she is unable to completely flex her left ankle and on examination it seems this is potentially related to scar tissue from her wounds. We do have the ProgenaMatrix trial product available for her today. 11/15/2021: Both wounds are smaller today. There is some slough accumulation on the surfaces, but the medial wound, in particular looks like it is filling in and is less deep. She did hear from physical therapy and she is going to start working with them on July 11. She is here for her second application of the trial skin substitute, ProgenaMatrix. 11/22/2021: Both wounds continue to contract, the medial more dramatically than the lateral. Both wounds have a layer of slough on the surface, but underneath this, the gritty fibrous tissue has a little bit more of a pink cast to it rather than being as pale as it has been. 11/29/2021: The wounds are roughly the same size this week, perhaps a millimeter or 2 smaller. The medial wound has filled in and is nearly flush with the surrounding skin surface. She continues to have a lot of slough accumulation on both surfaces. 12/06/2021: No significant change to her wounds, but she has a new opening on her dorsal foot, just distal to the right lateral ankle wound. The area on her left medial ankle that reopened looks a little bit larger today. She has quite a bit of pain associated with the new wound. 12/12/2021: Her wounds look about the same but the new opening on her right lateral dorsal foot is a little bit bigger. She continues to have a fair amount of pain with this wound. 12/26/2021: The left medial ankle wound is tiny and superficial. She has 2 areas of crusting on her left lateral ankle, however, that appear to be threatening to open again. Her right medial ankle wound is a little bit smaller today but still continues to accumulate thick rubbery slough. The new dorsal  foot wound is exquisitely painful but there is no odor or purulent drainage. No erythema or induration. The right lateral ankle wound looks about the same today, again with thick rubbery slough. 01/03/2022: The left medial ankle wound has closed again. Both right ankle wounds appear to be about the same size with thick rubbery slough. The dorsal foot wound on the right continues to be quite painful and she stated that she did not want any debridement of that site today. 01/10/2022: No real change to any of her wounds. She continues to accumulate thick slough. The dorsal foot wound has merged with the lateral malleolar wound. She is experiencing significant pain in the dorsal foot portion of the ulcer. Patient History  Information obtained from Patient. Family History Diabetes - Mother, Lung Disease - Mother, No family history of Cancer, Heart Disease, Hereditary Spherocytosis, Hypertension, Kidney Disease, Seizures, Stroke, Thyroid Problems, Tuberculosis. Social History Never smoker, Marital Status - Married, Alcohol Use - Never, Drug Use - No History, Caffeine Use - Daily. Medical History Eyes Denies history of Cataracts, Glaucoma, Optic Neuritis Ear/Nose/Mouth/Throat Denies history of Chronic sinus problems/congestion, Middle ear problems Hematologic/Lymphatic Patient has history of Anemia, Sickle Cell Disease Denies history of Hemophilia, Human Immunodeficiency Virus, Lymphedema Respiratory Denies history of Aspiration, Asthma, Chronic Obstructive Pulmonary Disease (COPD), Pneumothorax, Sleep Apnea, Tuberculosis Cardiovascular Denies history of Angina, Arrhythmia, Congestive Heart Failure, Coronary Artery Disease, Deep Vein Thrombosis, Hypertension, Hypotension, Myocardial Infarction, Peripheral Arterial Disease, Peripheral Venous Disease, Phlebitis, Vasculitis Gastrointestinal Denies history of Cirrhosis , Colitis, Crohnoos, Hepatitis A, Hepatitis B, Hepatitis C Endocrine Denies  history of Type I Diabetes, Type II Diabetes Genitourinary Denies history of End Stage Renal Disease Immunological Denies history of Lupus Erythematosus, Raynaudoos, Scleroderma Integumentary (Skin) Denies history of History of Burn Musculoskeletal Denies history of Gout, Rheumatoid Arthritis, Osteoarthritis, Osteomyelitis Neurologic Patient has history of Neuropathy - right foot intermittant Denies history of Dementia, Quadriplegia, Paraplegia, Seizure Disorder Oncologic Denies history of Received Chemotherapy, Received Radiation Psychiatric Denies history of Anorexia/bulimia, Confinement Anxiety Hospitalization/Surgery History - c section x2. - left breast lumpectomy. - iandD right ankle with theraskin. Medical A Surgical History Notes nd Constitutional Symptoms (General Health) H/O miscarriage Cardiovascular bradycardia Gastrointestinal cholilithiasis Objective Constitutional No acute distress.. Vitals Time Taken: 11:14 AM, Height: 67 in, Temperature: 98.1 F, Pulse: 98 bpm, Respiratory Rate: 18 breaths/min, Blood Pressure: 136/76 mmHg. Respiratory Normal work of breathing on room air.. General Notes: 01/10/2022: No real change to any of her wounds. She continues to accumulate thick slough. The dorsal foot wound has merged with the lateral malleolar wound. She is experiencing significant pain in the dorsal foot portion of the ulcer. Integumentary (Hair, Skin) Wound #17 status is Open. Original cause of wound was Gradually Appeared. The date acquired was: 10/05/2012. The wound has been in treatment 38 weeks. The wound is located on the Right,Lateral Lower Leg. The wound measures 11cm length x 9cm width x 0.1cm depth; 77.754cm^2 area and 7.775cm^3 volume. There is Fat Layer (Subcutaneous Tissue) exposed. There is no tunneling or undermining noted. There is a large amount of purulent drainage noted. The wound margin is distinct with the outline attached to the wound base. There  is small (1-33%) pink, pale granulation within the wound bed. There is a large (67-100%) amount of necrotic tissue within the wound bed including Adherent Slough. General Notes: Cluster of 3 Wound #21 status is Open. Original cause of wound was Gradually Appeared. The date acquired was: 06/26/2021. The wound has been in treatment 28 weeks. The wound is located on the Right,Medial Ankle. The wound measures 7.2cm length x 3.5cm width x 0.1cm depth; 19.792cm^2 area and 1.979cm^3 volume. There is Fat Layer (Subcutaneous Tissue) exposed. There is a medium amount of purulent drainage noted. The wound margin is distinct with the outline attached to the wound base. There is small (1-33%) pink granulation within the wound bed. There is a large (67-100%) amount of necrotic tissue within the wound bed including Adherent Slough. Wound #23 status is Converted. Original cause of wound was Gradually Appeared. The date acquired was: 12/06/2021. The wound has been in treatment 4 weeks. The wound is located on the Right,Distal,Lateral Ankle. The wound measures 0.3cm length x 0.3cm width x 0.1cm depth; 0.071cm^2  area and 0.007cm^3 volume. There is Fat Layer (Subcutaneous Tissue) exposed. There is no tunneling or undermining noted. There is a medium amount of serosanguineous drainage noted. The wound margin is distinct with the outline attached to the wound base. There is small (1-33%) pink granulation within the wound bed. There is a large (67-100%) amount of necrotic tissue within the wound bed including Adherent Slough. 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 There was a Selective/Open Wound Non-Viable Tissue Debridement with a total area of 99 sq cm performed by Fredirick Maudlin, MD. With the following instrument(s): Curette to remove Non-Viable tissue/material. Material removed includes [facility] after achieving pain control using Lidocaine 5% topical ointment. No specimens were taken. A time out was conducted at 11:30, prior to the start of the procedure. A Minimum amount of bleeding was controlled with Pressure. The procedure was tolerated well with a pain level of 0 throughout and a pain level of 0 following the procedure. Post Debridement Measurements: 11cm length x 9cm width x 0.1cm depth; 7.775cm^3 volume. Character of Wound/Ulcer Post Debridement is improved. Post procedure Diagnosis Wound #17: Same as Pre-Procedure Wound #21 Pre-procedure diagnosis of Wound #21 is a Sickle Cell Lesion located on the Right,Medial Ankle .Severity of Tissue Pre Debridement is: Fat layer exposed. There was a Selective/Open Wound Non-Viable Tissue Debridement with a total area of 25.2 sq cm performed by Fredirick Maudlin, MD. With the following instrument(s): Curette to remove Non-Viable tissue/material. Material removed includes [facility] after achieving pain control using Lidocaine 5% topical ointment. No specimens were taken. A time out was conducted at 11:30, prior to the start of the procedure. A Minimum amount of bleeding was controlled with Pressure. The procedure was tolerated well with a pain level of 0 throughout and a pain level of 0 following the procedure. Post Debridement Measurements: 7.2cm length x 3.5cm width x 0.1cm depth; 1.979cm^3 volume. Character of Wound/Ulcer Post Debridement is improved. Severity of Tissue Post Debridement is: Fat layer exposed. Post procedure Diagnosis Wound #21: Same as Pre-Procedure Plan Follow-up Appointments: Return Appointment in 1 week. - Dr. Celine Ahr and Roselyn Reef Rm 4 01/16/22 at 8:45am Anesthetic: (In clinic) Topical Lidocaine 5% applied to wound bed (In clinic) Topical Lidocaine 4% applied to wound bed - In Clinic Bathing/ Shower/  Hygiene: May shower with protection but do not get wound dressing(s) wet. - Can get cast protector bags at [facility] or [facility] Edema Control - Lymphedema / SCD / Other: Elevate legs to the level of the heart or above for 30 minutes daily and/or when sitting, a frequency of: - throughout the day Avoid standing for long periods of time. Exercise regularly Additional Orders / Instructions: Follow Nutritious Diet WOUND #17: - Lower Leg Wound Laterality: Right, Lateral Cleanser: Soap and Water 1 x Per Week/30 Days Discharge Instructions: May shower and wash wound with dial antibacterial soap and water prior to dressing change. Cleanser: Wound Cleanser 1 x Per Week/30 Days Discharge Instructions: Cleanse the wound with wound cleanser prior to applying a clean dressing using gauze sponges, not tissue or cotton balls. Peri-Wound Care: Triamcinolone 15 (g) 1 x Per Week/30 Days Discharge Instructions: Use triamcinolone 15 (g) as directed Peri-Wound Care: Sween Lotion (Moisturizing lotion) 1 x Per Week/30 Days Discharge Instructions: Apply moisturizing lotion as directed Topical: Mupirocin Ointment 1  x Per Week/30 Days Discharge Instructions: Apply Mupirocin (Bactroban) as instructed Prim Dressing: Promogran Prisma Matrix, 4.34 (sq in) (silver collagen) 1 x Per Week/30 Days ary Discharge Instructions: Moisten collagen with saline or hydrogel Secondary Dressing: ABD Pad, 5x9 1 x Per Week/30 Days Discharge Instructions: Apply over primary dressing as directed. Secondary Dressing: Zetuvit Plus 4x8 in 1 x Per Week/30 Days Discharge Instructions: Apply over primary dressing as directed. Com pression Wrap: Kerlix Roll 4.5x3.1 (in/yd) 1 x Per Week/30 Days Discharge Instructions: Apply Kerlix and Coban compression as directed. Com pression Wrap: Coban Self-Adherent Wrap 4x5 (in/yd) 1 x Per Week/30 Days Discharge Instructions: Apply over Kerlix as directed. WOUND #21: - Ankle Wound Laterality: Right,  Medial Cleanser: Soap and Water 1 x Per Week/30 Days Discharge Instructions: May shower and wash wound with dial antibacterial soap and water prior to dressing change. Cleanser: Wound Cleanser 1 x Per Week/30 Days Discharge Instructions: Cleanse the wound with wound cleanser prior to applying a clean dressing using gauze sponges, not tissue or cotton balls. Peri-Wound Care: Triamcinolone 15 (g) 1 x Per Week/30 Days Discharge Instructions: Use triamcinolone 15 (g) as directed Peri-Wound Care: Sween Lotion (Moisturizing lotion) 1 x Per Week/30 Days Discharge Instructions: Apply moisturizing lotion as directed Topical: Mupirocin Ointment 1 x Per Week/30 Days Discharge Instructions: Apply Mupirocin (Bactroban) as instructed Prim Dressing: Promogran Prisma Matrix, 4.34 (sq in) (silver collagen) 1 x Per Week/30 Days ary Discharge Instructions: Moisten collagen with saline or hydrogel Secondary Dressing: ABD Pad, 5x9 1 x Per Week/30 Days Discharge Instructions: Apply over primary dressing as directed. Secondary Dressing: Zetuvit Plus 4x8 in 1 x Per Week/30 Days Discharge Instructions: Apply over primary dressing as directed. Com pression Wrap: Kerlix Roll 4.5x3.1 (in/yd) 1 x Per Week/30 Days Discharge Instructions: Apply Kerlix and Coban compression as directed. Com pression Wrap: Coban Self-Adherent Wrap 4x5 (in/yd) 1 x Per Week/30 Days Discharge Instructions: Apply over Kerlix as directed. 01/10/2022: No real change to any of her wounds. She continues to accumulate thick slough. The dorsal foot wound has merged with the lateral malleolar wound. She is experiencing significant pain in the dorsal foot portion of the ulcer. I used a curette to debride as much slough as she would permit. She did not allow me to touch the dorsal foot portion of her wound. As we are no longer able to use the donated skin substitute, I will go back to mupirocin with Prisma silver collagen. The patient says that she has  enough mupirocin at home and does not need a refill. I will see her in 1 week. Electronic Signature(s) Signed: 01/10/2022 12:21:33 PM By: Fredirick Maudlin MD FACS Entered By: Fredirick Maudlin on 01/10/2022 12:21:33 -------------------------------------------------------------------------------- HxROS Details Patient Name: Date of Service: [name], [name] 01/10/2022 10:45 A M Medical Record Number: [id number] Patient Account Number: [id number] Date of Birth/Sex: Treating RN: Jul 07, 1970 (51 y.o. F) Primary Care Provider: [name] Other Clinician: Referring Provider: Treating Provider/Extender: Elyse Jarvis Weeks in Treatment: 40 Information Obtained From Patient Constitutional Symptoms (General Health) Medical History: Past Medical History Notes: H/O miscarriage Eyes Medical History: Negative for: Cataracts; Glaucoma; Optic Neuritis Ear/Nose/Mouth/Throat Medical History: Negative for: Chronic sinus problems/congestion; Middle ear problems Hematologic/Lymphatic Medical History: Positive for: Anemia; Sickle Cell Disease Negative for: Hemophilia; Human Immunodeficiency Virus; Lymphedema Respiratory Medical History: Negative for: Aspiration; Asthma; Chronic Obstructive Pulmonary Disease (COPD); Pneumothorax; Sleep Apnea; Tuberculosis Cardiovascular Medical History: Negative for: Angina; Arrhythmia; Congestive Heart Failure; Coronary Artery Disease; Deep Vein Thrombosis; Hypertension; Hypotension;  Myocardial Infarction; Peripheral Arterial Disease; Peripheral Venous Disease; Phlebitis; Vasculitis Past Medical History Notes: bradycardia Gastrointestinal Medical History: Negative for: Cirrhosis ; Colitis; Crohns; Hepatitis A; Hepatitis B; Hepatitis C Past Medical History Notes: cholilithiasis Endocrine Medical History: Negative for: Type I Diabetes; Type II Diabetes Genitourinary Medical History: Negative for: End Stage Renal  Disease Immunological Medical History: Negative for: Lupus Erythematosus; Raynauds; Scleroderma Integumentary (Skin) Medical History: Negative for: History of Burn Musculoskeletal Medical History: Negative for: Gout; Rheumatoid Arthritis; Osteoarthritis; Osteomyelitis Neurologic Medical History: Positive for: Neuropathy - right foot intermittant Negative for: Dementia; Quadriplegia; Paraplegia; Seizure Disorder Oncologic Medical History: Negative for: Received Chemotherapy; Received Radiation Psychiatric Medical History: Negative for: Anorexia/bulimia; Confinement Anxiety Immunizations Pneumococcal Vaccine: Received Pneumococcal Vaccination: No Implantable Devices None Hospitalization / Surgery History Type of Hospitalization/Surgery c section x2 left breast lumpectomy iandD right ankle with theraskin Family and Social History Cancer: No; Diabetes: Yes - Mother; Heart Disease: No; Hereditary Spherocytosis: No; Hypertension: No; Kidney Disease: No; Lung Disease: Yes - Mother; Seizures: No; Stroke: No; Thyroid Problems: No; Tuberculosis: No; Never smoker; Marital Status - Married; Alcohol Use: Never; Drug Use: No History; Caffeine Use: Daily; Financial Concerns: No; Food, Clothing or Shelter Needs: No; Support System Lacking: No; Transportation Concerns: No Engineer, maintenance) Signed: 01/10/2022 12:47:53 PM By: Fredirick Maudlin MD FACS Entered By: Fredirick Maudlin on 01/10/2022 12:19:46 -------------------------------------------------------------------------------- SuperBill Details Patient Name: Date of Service: [name], [name] 01/10/2022 Medical Record Number: [id number] Patient Account Number: [id number] Date of Birth/Sex: Treating RN: May 11, 1971 (51 y.o. 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 Facility Procedures CPT4 Code: [id number] Description: [phone] - DEBRIDE WOUND 1ST 20 SQ CM OR < ICD-10 Diagnosis Description L97.818 Non-pressure chronic ulcer of other part of right lower leg with other specified s Modifier: everity Quantity: 1 CPT4 Code: [id number] Description: [id number] - DEBRIDE WOUND EA ADDL 20 SQ CM ICD-10 Diagnosis Description L97.818 Non-pressure chronic ulcer of other part of right lower leg with other specified s Modifier: everity Quantity: 6 Physician Procedures : CPT4 Code Description Modifier [id number] - WC PHYS LEVEL 4 - EST PT 25 ICD-10 Diagnosis Description L97.818 Non-pressure chronic ulcer of other part of right lower leg with other specified severity 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

## 2022-01-10 NOTE — Therapy (Addendum)
OUTPATIENT PHYSICAL THERAPY TREATMENT NOTE/DC SUMMARY   Patient Name: [name] MRN: [id number] DOB:11/15/1970, 51 y.o., female 82 Date: 01/10/2022  PCP:  [name], FNP  REFERRING PROVIDER: Fredirick Maudlin, MD PHYSICAL THERAPY DISCHARGE SUMMARY  Visits from Start of Care: 10  Current functional level related to goals / functional outcomes: Maximum function regained   Remaining deficits: Decreased strength and ROM, Pain   Education / Equipment: HEP   Patient agrees to discharge. Patient goals were partially met. Patient is being discharged due to maximized rehab potential.   END OF SESSION:   PT End of Session - 01/10/22 1432     Visit Number 10    Number of Visits 10    Date for PT Re-Evaluation 01/22/22    Authorization Type [place] MEDICAID HEALTHY BLUE    Authorization - Visit Number 4    Authorization - Number of Visits 4    PT Start Time 5809    PT Stop Time 1515    PT Time Calculation (min) 40 min    Activity Tolerance Patient limited by pain    Behavior During Therapy [facility] for tasks assessed/performed              Past Medical History:  Diagnosis Date   Anemia 03/30/2012   Hx of sickle cell disease   CAP (community acquired pneumonia)    2014   Cholelithiasis 03/30/2012   Chronic wound of extremity 04/01/2012   Elevated LFTs    Leg ulcer ([place]) 10/27/2012   Chronic under care of wound clinic   Multiple open wounds of lower extremity    chronic wounds B/LLE   Sickle cell disease ([place])    Sinus bradycardia by electrocardiogram 04/03/2012   Past Surgical History:  Procedure Laterality Date   ALLOGRAFT APPLICATION Right 9/83/3825   Procedure: SURGICAL PREP FOR GRAFTING RIGHT LOWER EXTREMITY AND APPLICATION OF Jannifer Hick;  Surgeon: Irene Limbo, MD;  Location: [place];  Service: Plastics;  Laterality: Right;   APPLICATION OF A-CELL OF EXTREMITY Right 10/09/2015   Procedure: APPLICATION OF Jannifer Hick;  Surgeon: Irene Limbo, MD;  Location: [place];  Service: Plastics;  Laterality: Right;   BREAST SURGERY     left breast cyst aspiration   CESAREAN SECTION     CESAREAN SECTION N/A 01/06/2014   Procedure: CESAREAN SECTION;  Surgeon: Emily Filbert, MD;  Location: [facility];  Service: Obstetrics;  Laterality: N/A;   CESAREAN SECTION N/A 05/04/2017   Procedure: CESAREAN SECTION;  Surgeon: Woodroe Mode, MD;  Location: [place];  Service: Obstetrics;  Laterality: N/A;   I & D EXTREMITY Right 10/09/2015   Procedure: SURGICAL PREPARATION FOR GRAFTING RIGHT ANKLE AND APPLICATION THERASKIN;  Surgeon: Irene Limbo, MD;  Location: [place];  Service: Plastics;  Laterality: Right;   SKIN FULL THICKNESS GRAFT Bilateral 06/17/2016   Procedure: SURGICAL PREP FOR GRAFTING, BILATERAL LOWER EXTREMITIES AND APPLICATION OF Jannifer Hick;  Surgeon: Irene Limbo, MD;  Location: [place];  Service: Plastics;  Laterality: Bilateral;   TONSILLECTOMY     Patient Active Problem List   Diagnosis Date Noted   Protein-calorie malnutrition, severe 04/06/2021   Bilateral leg ulcer ([place]) 03/28/2021   Low hemoglobin 05/01/2020   At risk for seizures due to oxygen toxicity 02/09/2020   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   Venous stasis 01/19/2020   Partial loss of skin graft 01/12/2020   Acute respiratory failure with hypoxia (H[facility]) 12/15/2019   Bone infarction of distal  tibia, 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/17/2019   Sickle cell anemia with crisis ([place]) 09/03/2018   Other chronic pain    Sickle cell crisis ([place]) 06/26/2018   Sickle cell pain crisis ([place]) 04/07/2018   Avascular necrosis of bone of hip, left ([place]) 12/08/2017   Avascular necrosis of bone of hip, right ([place]) 12/08/2017   Hb-SS disease without crisis ([place]) 11/26/2017   Anemia of chronic disease 11/26/2017   Abnormal quad screen    Ringworm of body 04/05/2017   Ulcer of right lower extremity ([place])    Hb-SS disease with vaso-occlusive crisis  ([place]) 03/02/2017   History of pre-eclampsia 12/16/2016   Previous cesarean delivery x 2 07/22/2013   Hemochromatosis 11/10/2012   Chronic pain syndrome 38/45/3646   Complicated wound infection 04/01/2012   Leukocytosis 03/30/2012    REFERRING DIAG: Decreased L ankle ROM  THERAPY DIAG: Decreased L ankle ROM   Rationale for Evaluation and Treatment Rehabilitation  PERTINENT HISTORY: Sickle Cell disease.   PRECAUTIONS: None  SUBJECTIVE: Patient reports 5/10 pain today and HEP compliance.  PAIN:  Are you having pain? Yes: NPRS scale: 5/10 Pain location: L ankle Pain description: Pulling sensation Aggravating factors: Walking, squatting, stairs Relieving factors: Sitting, laying down, pain medication.   OBJECTIVE:    DIAGNOSTIC FINDINGS: None   PATIENT SURVEYS:  LEFS 19/80; 12/11/21 17/80 01/10/22: 19/80   COGNITION:           Overall cognitive status: Within functional limits for tasks assessed                          SENSATION: WFL   POSTURE: No Significant postural limitations   PALPATION: None.    LOWER EXTREMITY ROM:   Active ROM Right AROM eval Left AROM eval Left PROM Left PROM 12/31/2021   Hip flexion WFL Mod limitation   Mod limitation  Hip extension         Hip abduction Min limitation Severe limitation   Severe limitation  Hip adduction WFL Severe limitation   Severe limitation  Hip internal rotation Mod limitation Severe limitation   Severe limitation  Hip external rotation Min limitation Severe limitation   Severe limitation  Knee flexion [facility]   WFL  Knee extension [facility]   WFL  Ankle dorsiflexion NT  -10 (lacking) -8 -7  Ankle plantarflexion NT  50   52  Ankle inversion NT  18   25  Ankle eversion NT  20   19   (Blank rows = not tested)   LOWER EXTREMITY MMT:   MMT Right eval Left eval Left 12/31/2021  Hip flexion 5 3+ 3+  Knee flexion NT  5 5  Knee extension NT  5 5  Ankle dorsiflexion NT  4+ 4+  Ankle plantarflexion NT  4+ 4+   Ankle inversion NT  4 4  Ankle eversion NT  4 4   (Blank rows = not tested)   FUNCTIONAL TESTS:  Timed up and go (TUG): 19 sec TUG: 18.97 sec   GAIT: Distance walked: 18f  Assistive device utilized: Single point cane Level of assistance: Complete Independence Comments: Use of SPC, walking on tippee toes on L LE.    TODAY'S TREATMENT: [facility] Adult PT Treatment:  DATE: 01/10/2022 Therapeutic Exercise: Nustep L4 8 min Step ups LLE onto 6 in with airex on top 15x R step ups onto 6 in block from airex to focus on L ankle stability 15x L SLS on Airex occasional fingertip support 30s x2 occasional fingertip support Tandem stance airex 2 positions 30s x2 occasional fingertip support Runners step leading L 15x 4 in block Sidestepping on airex beam no UE support 3 trips  Therapeutic Activity: Administration of LEFS Discussion of goals HEP review   [facility] Adult PT Treatment:                                                DATE: 01/08/22 Therapeutic Exercise: Nustep L4 8 min Step ups LLE onto 6 in with airex on top 15x R step ups onto 8 in block from airex to focus on L ankle stability 15x RB A/P tilt 30s x2, then static stand 30s x2 Tandem and retrowalking on airex beam 5 trips fingertip support Wall squats x15 against ball L SLS on Airex occasional fingertip support 30s x2 occasional fingertip support Tandem stance airex 2 positions 30s x2 occasional fingertip support Runners step leading L 15x 4 in block Sidestepping on airex beam no UE support 5 trips   [facility] Adult PT Treatment:                                                DATE: 01/03/22 Therapeutic Exercise: Nustep L 8 min Stair negotiation 16 steps ascending step through w/cane and single rail, descending with step to pattern single rail and cane  Step ups L 4 in with airex on top 15x R step ups onto 6 in block from airex to focus on L ankle stability 15x RB A/P tilt 30s x2, then  static stand 30s x2 Tandem and retrowalking on airex beam 5 trips fingertip support Wall squats x15 against ball L SLS on Airex occasional fingertip support 30s x3 Runners step leading L 15x 4 in block Sidestepping on airex beam no UE support 5 trips    PATIENT EDUCATION:  Education details: Educated pt on anatomy and physiology of current symptoms, LEFS questionnaire, diagnosis, prognosis, HEP,  and POC. Person educated: Patient Education method: Explanation, Demonstration, Tactile cues, Verbal cues, and Handouts Education comprehension: verbalized understanding and returned demonstration   HOME EXERCISE PROGRAM: Access Code: [email or web address] URL: [email or web address] Date: 11/27/2021 Prepared by: Rudi Heap   Exercises - Supine Single Leg Ankle Pumps  - 2 x daily - 7 x weekly - 3 sets - 10 reps - Ankle Alphabet in Elevation  - 2 x daily - 7 x weekly - 2 sets - Seated Toe Raise  - 2 x daily - 7 x weekly - 2 sets - 10 reps - Seated Long Arc Quad  - 2 x daily - 7 x weekly - 2 sets - 10 reps - Long Sitting Ankle Eversion with Resistance  - 2 x daily - 7 x weekly - 2 sets - 10 reps - Long Sitting Ankle Dorsiflexion with Anchored Resistance  - 2 x daily - 7 x weekly - 2 sets - 10 reps - Long Sitting Ankle Inversion with Resistance  - 2 x daily - 7 x weekly -  2 sets - 10 reps   ASSESSMENT:   CLINICAL IMPRESSION: Patient presents to PT with continued L ankle pain and reports HEP compliance. Session today focused on LE and ankle strengthening as well as discussion of goals, review of HEP, and discharge information. Patient is appropriate for discharge at this time.    OBJECTIVE IMPAIRMENTS Abnormal gait, decreased activity tolerance, decreased balance, decreased endurance, decreased knowledge of condition, decreased mobility, difficulty walking, decreased ROM, decreased strength, impaired flexibility, impaired sensation, impaired tone, and pain.    ACTIVITY LIMITATIONS carrying,  lifting, bending, standing, squatting, stairs, transfers, dressing, and caring for others   PARTICIPATION LIMITATIONS: meal prep, cleaning, laundry, shopping, and community activity   PERSONAL FACTORS Education, Past/current experiences, Time since onset of injury/illness/exacerbation, and 1 comorbidity: Sickle cell anemia  are also affecting patient's functional outcome.    REHAB POTENTIAL: Good   CLINICAL DECISION MAKING: Stable/uncomplicated   EVALUATION COMPLEXITY: Low     GOALS: Goals reviewed with patient? No   SHORT TERM GOALS: Target date: 12/25/2021  Pt will be I and compliant with initial HEP. Baseline: Patient able to demo to PT w/o need of cuing Goal status: Met   3.  Pt will increase LEFS score to 28/80 indicating minimal detectable change Baseline: 12/11/21 17/80,  Goal status: 12/31/2021, 29/80    LONG TERM GOALS: Target date: 01/22/2022    Pt will be I and compliant with comprhensiveHEP. Baseline:  Goal status: MET   2.  Increase LEFS to 56/80 indicating minimal detectable change Baseline: 12/11/21 17/80  Goal status: NOT MET 01/10/22: 19/80   3.  Pt will report <2/10 pain at baseline  Baseline: 4/10 baseline pain Goal status: Not met Pt reports 5/10 on 01/10/22   4.  Pt will improve DF to neutral on L LE. Baseline: 12/11/21 -2d DF PROM noted at 0/90d knee position; 01/08/22 -2d AROM, 0d PROM Goal status: Met   5.  Pt will be able to ascend/ descend stairs with alternating gait patten.  Baseline: 12/11/21 Able to ascend with [facility] and single rail alternating but descends with step to pattern Goal status: Pt is able to perform alternating pattern, but has increased pain. Met   PLAN: PT FREQUENCY: 2x/week   PT DURATION: 2 weeks   PLANNED INTERVENTIONS: Therapeutic exercises, Therapeutic activity, Neuromuscular re-education, Balance training, Gait training, Patient/Family education, Joint manipulation, Joint mobilization, Stair training, Orthotic/Fit training,  Aquatic Therapy, Dry Needling, Electrical stimulation, Cryotherapy, Moist heat, Manual lymph drainage, Compression bandaging, scar mobilization, Taping, Traction, Ultrasound, Manual therapy, and Re-evaluation   PLAN FOR NEXT SESSION: Assess for DC as progress has plateaued due to underlying chronic degenerative changes     Margarette Canada, PTA 01/10/2022, 2:32 PM   Check all possible CPT codes: 97164 - PT Re-evaluation, 97110- Therapeutic Exercise, [phone]- Neuro Re-education, [phone] - Gait Training, [phone] - Manual Therapy, 97530 - Therapeutic Activities, and 97535 - Self Care     If treatment provided at initial evaluation, no treatment charged due to lack of authorization.

## 2022-01-11 NOTE — Progress Notes (Signed)
[name] ([id number]) Visit Report for 01/10/2022 Arrival Information Details Patient Name: Date of Service: [name] [name] 01/10/2022 10:45 A M Medical Record Number: [id number] Patient Account Number: [id number] Date of Birth/Sex: Treating RN: [name] (51 y.o. [name] Primary Care [name]: [name] Other Clinician: Referring [name]: Treating [name]: [name]: 51 Visit Information History Since Last Visit Added or deleted any medications: No Patient Arrived: [name] Any new allergies or adverse reactions: No Arrival Time: 11:13 Had a fall or experienced change in No Accompanied By: self activities of daily living that may affect Transfer Assistance: None risk of falls: Patient Identification Verified: Yes Signs or symptoms of abuse/neglect since last visito No Patient Requires Transmission-Based Precautions: No Hospitalized since last visit: No Patient Has Alerts: No Implantable device outside of the clinic excluding No cellular tissue based products placed in the center since last visit: Has Dressing in Place as Prescribed: Yes Has Compression in Place as Prescribed: Yes Pain Present Now: Yes Electronic Signature(s) Signed: 01/10/2022 5:51:42 PM By: [name] Catholic RN Entered By: [name] on 01/10/2022 11:13:50 -------------------------------------------------------------------------------- Lower Extremity Assessment Details Patient Name: Date of Service: [name], [name] [name] 01/10/2022 10:45 A M Medical Record Number: [id number] Patient Account Number: [id number] Date of Birth/Sex: Treating RN: [name] (51 y.o. [name] Primary Care Dujuan Stankowski: [name] Other Clinician: Referring [name]: Treating [name]/Extender: [name]: 38 Edema Assessment Assessed: [Left: No] [Right: No] Edema: [Left: Ye] [Right: s] Calf Left:  Right: Point of Measurement: 33 cm From Medial Instep 28.3 cm 31 cm Ankle Left: Right: Point of Measurement: 10 cm From Medial Instep 18.2 cm 20.5 cm Electronic Signature(s) Signed: 01/10/2022 5:51:42 PM By: [name] Catholic RN Entered By: [name] on 01/10/2022 11:20:30 -------------------------------------------------------------------------------- Multi Wound Chart Details Patient Name: Date of Service: [name], [name] 01/10/2022 10:45 A M Medical Record Number: [id number] Patient Account Number: [id number] Date of Birth/Sex: Treating RN: [name] (52 y.o. 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 Primary Etiology: N/A Venous Leg Ulcer N/A Secondary Etiology: Anemia, [name] Cell Disease, Anemia, [name] Cell Disease, Anemia, [name] Cell Disease, Comorbid History: Neuropathy Neuropathy Neuropathy 10/05/2012 06/26/2021 12/06/2021 Date Acquired: 38 28 4 [name] of [name]: Open Open Converted Wound Status: No No No Wound Recurrence: Yes No No Clustered Wound: 11x9x0.1 7.2x3.5x0.1 0.3x0.3x0.1 Measurements L x W x D (cm) 77.754 19.792 0.071 A (cm) : rea 7.775 1.979 0.007 Volume (cm) : 58.60% 31.70% 84.90% % Reduction in A rea: 58.60% 31.70% 92.60% % Reduction in Volume: Full Thickness Without Exposed Full Thickness Without Exposed Full Thickness Without Exposed Classification: Support Structures Support Structures Support  Structures Large Medium Medium Exudate A mount: Purulent Purulent Serosanguineous Exudate Type: yellow, [name], green yellow, [name], green red, [name] Exudate Color: Distinct, outline attached Distinct, outline attached Distinct, outline attached Wound Margin: Small (1-33%) Small (1-33%) Small (1-33%) Granulation A mount: Pink, Pale Pink Pink Granulation Quality: Large (67-100%) Large (67-100%) Large (67-100%) Necrotic A mount: Fat Layer (Subcutaneous Tissue): Yes Fat Layer (Subcutaneous Tissue): Yes Fat Layer (Subcutaneous Tissue): Yes Exposed Structures: Fascia: No  Fascia: No Fascia: No Tendon: No Tendon: No Tendon: No Muscle: No Muscle: No Muscle: No Joint: No Joint: No Joint: No Bone: No Bone: No Bone: No Medium (34-66%) Medium (34-66%) Small (1-33%) Epithelialization: Debridement - Selective/Open Wound Debridement - Selective/Open Wound N/A Debridement: Pre-procedure Verification/Time Out 11:30 11:30 N/A Taken: Lidocaine 5% topical ointment Lidocaine 5% topical ointment N/A Pain Control: [facility] N/A Tissue Debrided: Non-Viable Tissue Non-Viable Tissue N/A Level: 99 25.2 N/A Debridement A (sq cm): rea Curette Curette N/A Instrument: Minimum Minimum N/A Bleeding: Pressure Pressure N/A Hemostasis A chieved: 0 0 N/A Procedural Pain: 0 0 N/A Post Procedural Pain: Procedure was tolerated well Procedure was tolerated well N/A Debridement [name] Response: 11x9x0.1 7.2x3.5x0.1 N/A Post Debridement Measurements L x W x D (cm) 7.775 1.979 N/A Post Debridement Volume: (cm) Cluster of 3 N/A N/A Assessment Notes: Debridement Debridement N/A Procedures Performed: [name] Notes Electronic Signature(s) Signed: 01/10/2022 12:17:02 PM By: [name] Maudlin MD FACS Entered By: [name] on 01/10/2022 12:17:02 -------------------------------------------------------------------------------- Pain Assessment Details Patient Name: Date of  Service: [name], [name] 01/10/2022 10:45 A M Medical Record Number: [id number] Patient Account Number: [id number] Date of Birth/Sex: Treating RN: [name] (51 y.o. [name] Primary Care [name]: [name] Other Clinician: Referring Izea Livolsi: Treating Mazi Brailsford/Extender: [name]: 30 Active Problems Location of Pain Severity and Description of Pain Patient Has Paino Yes Site Locations Pain Location: Generalized Pain With Dressing Change: No Duration of the Pain. Constant / Intermittento Constant Rate the pain. Current Pain Level: 5 Worst Pain Level: 10 Least Pain Level: 3 Tolerable Pain Level: 5 Character of Pain Describe the Pain: Difficult to Pinpoint Pain Management and Medication Current Pain Management: Medication: Yes Cold Application: No Rest: Yes Massage: No Activity: No T.E.N.S.: No Heat Application: No Leg drop or elevation: No Is the Current Pain Management Adequate: Inadequate How does your wound impact your activities of daily livingo Sleep: No Bathing: No Appetite: No Relationship With Others: No Bladder Continence: No Emotions: No Bowel Continence: No Work: No Toileting: No Drive: No Dressing: No Hobbies: No Electronic Signature(s) Signed: 01/10/2022 5:51:42 PM By: [name] Catholic RN Signed: 01/10/2022 5:51:42 PM By: [name] Catholic RN Entered By: [name] on 01/10/2022 11:15:35 -------------------------------------------------------------------------------- Wound Assessment Details Patient Name: Date of Service: [name], [name] 01/10/2022 10:45 A M Medical Record Number: [id number] Patient Account Number: [id number] Date of Birth/Sex: Treating RN: [birth year] [name] (51 y.o. [name] Primary Care Aeva Posey: [name] Other Clinician: Referring Emina Ribaudo: Treating Raiyan Dalesandro/Extender: [name]: 38 Wound Status Wound  Number: [id number] Primary Etiology: [name] Cell Lesion Wound Location: Right, Lateral Lower Leg Wound Status: Open Wounding Event: Gradually Appeared Comorbid History: Anemia, [name] Cell Disease, Neuropathy Date Acquired: 10/05/2012 [name] Of [name]: 38 Clustered Wound: Yes Wound Measurements Length: (cm) 11 Width: (cm) 9 Depth: (cm) 0.1 Area: (cm) 77.754 Volume: (cm) 7.775 % Reduction in Area: 58.6% % Reduction in Volume: 58.6% Epithelialization: Medium (34-66%) Tunneling: No Undermining: No Wound Description Classification: Full Thickness Without Exposed Support Structures Wound Margin: Distinct, outline attached Exudate Amount: Large Exudate Type: Purulent Exudate Color: yellow, [name], green Foul Odor After Cleansing: No Slough/Fibrino Yes Wound Bed Granulation Amount: Small (1-33%) Exposed Structure Granulation Quality: Pink, Pale Fascia Exposed: No Necrotic Amount: Large (67-100%) Fat Layer (Subcutaneous Tissue) Exposed: Yes Necrotic Quality: Adherent Slough Tendon Exposed: No Muscle Exposed: No Joint Exposed: No Bone Exposed: No Assessment Notes Cluster of 3 Electronic Signature(s) Signed: 01/10/2022 5:51:42 PM  By: [name] Catholic RN Entered By: [name] on 01/10/2022 11:24:06 -------------------------------------------------------------------------------- Wound Assessment Details Patient Name: Date of Service: [name], [place] [name] 01/10/2022 10:45 A M Medical Record Number: [id number] Patient Account Number: [id number] Date of Birth/Sex: Treating RN: [birth year] [name] (51 y.o. F) Primary Care Kaylenn Civil: [name] Other Clinician: Referring Mal Asher: Treating Cam Dauphin/Extender: [name]: 38 Wound Status Wound Number: [id number] Primary Etiology: [name] Cell Lesion Wound Location: Right, Medial Ankle Secondary Etiology: Venous Leg Ulcer Wounding Event: Gradually Appeared Wound Status: Open Date Acquired:  06/26/2021 Comorbid History: Anemia, [name] Cell Disease, Neuropathy [name] Of [name]: 28 Clustered Wound: No Photos Wound Measurements Length: (cm) 7.2 Width: (cm) 3.5 Depth: (cm) 0.1 Area: (cm) 19.792 Volume: (cm) 1.979 % Reduction in Area: 31.7% % Reduction in Volume: 31.7% Epithelialization: Medium (34-66%) Wound Description Classification: Full Thickness Without Exposed Support Structures Wound Margin: Distinct, outline attached Exudate Amount: Medium Exudate Type: Purulent Exudate Color: yellow, [name], green Foul Odor After Cleansing: No Slough/Fibrino Yes Wound Bed Granulation Amount: Small (1-33%) Exposed Structure Granulation Quality: Pink Fascia Exposed: No Necrotic Amount: Large (67-100%) Fat Layer (Subcutaneous Tissue) Exposed: Yes Necrotic Quality: Adherent Slough Tendon Exposed: No Muscle Exposed: No Joint Exposed: No Bone Exposed: No Electronic Signature(s) Signed: 01/11/2022 10:40:34 AM By: Sandre Kitty Entered By: Sandre Kitty on 01/10/2022 11:23:02 -------------------------------------------------------------------------------- Wound Assessment Details Patient Name: Date of Service: [name], [name] 01/10/2022 10:45 A M Medical Record Number: [id number] Patient Account Number: [id number] Date of Birth/Sex: Treating RN: [birth year] [name] (51 y.o. F) Primary Care Malisha Mabey: [name] Other Clinician: Referring Kathyleen Radice: Treating Seraphim Trow/Extender: [name]: 38 Wound Status Wound Number: [id number] Primary Etiology: [name] Cell Lesion Wound Location: Right, Distal, Lateral Ankle Wound Status: Converted Wounding Event: Gradually Appeared Comorbid History: Anemia, [name] Cell Disease, Neuropathy Date Acquired: 12/06/2021 [name] Of [name]: 4 Clustered Wound: No Photos Wound Measurements Length: (cm) 0.3 Width: (cm) 0.3 Depth: (cm) 0.1 Area: (cm) 0.071 Volume: (cm) 0.007 % Reduction in Area:  84.9% % Reduction in Volume: 92.6% Epithelialization: Small (1-33%) Tunneling: No Undermining: No Wound Description Classification: Full Thickness Without Exposed Support Structures Wound Margin: Distinct, outline attached Exudate Amount: Medium Exudate Type: Serosanguineous Exudate Color: red, [name] Foul Odor After Cleansing: No Slough/Fibrino Yes Wound Bed Granulation Amount: Small (1-33%) Exposed Structure Granulation Quality: Pink Fascia Exposed: No Necrotic Amount: Large (67-100%) Fat Layer (Subcutaneous Tissue) Exposed: Yes Necrotic Quality: Adherent Slough Tendon Exposed: No Muscle Exposed: No Joint Exposed: No Bone Exposed: No Electronic Signature(s) Signed: 01/10/2022 5:51:42 PM By: [name] Catholic RN Entered By: [name] on 01/10/2022 11:24:58 -------------------------------------------------------------------------------- Vitals Details Patient Name: Date of Service: [name] [name], [name] 01/10/2022 10:45 A M Medical Record Number: [id number] Patient Account Number: [id number] Date of Birth/Sex: Treating RN: [birth year][name] (51 y.o. [name] Primary Care Kimberlee Shoun: [name] Other Clinician: Referring Lillyian Heidt: Treating Jetta Murray/Extender: [name]: 38 Vital Signs Time Taken: 11:14 Temperature (F): 98.1 Height (in): 67 Pulse (bpm): 98 Respiratory Rate (breaths/min): 18 Blood Pressure (mmHg): 136/76 Reference Range: 80 - 120 mg / dl Electronic Signature(s) Signed: 01/10/2022 5:51:42 PM By: [name] Catholic RN Entered By: [name] on 01/10/2022 11:14:53

## 2022-01-15 ENCOUNTER — Other Ambulatory Visit: Payer: Self-pay | Admitting: Family Medicine

## 2022-01-15 ENCOUNTER — Telehealth: Payer: Self-pay

## 2022-01-15 DIAGNOSIS — G894 Chronic pain syndrome: Secondary | ICD-10-CM

## 2022-01-15 MED ORDER — OXYCODONE HCL 10 MG PO TABS: 10.0000 mg | ORAL_TABLET | ORAL | 0 refills | Status: DC

## 2022-01-15 NOTE — Telephone Encounter (Signed)
Oxycodone  Ibuprofen  

## 2022-01-15 NOTE — Progress Notes (Signed)
Reviewed PDMP substance reporting system prior to prescribing opiate medications. No inconsistencies noted.  Meds ordered this encounter  Medications   Oxycodone HCl 10 MG TABS    Sig: Take 1 tablet (10 mg total) by mouth every 4 (four) hours while awake.    Dispense:  90 tablet    Refill:  0    Order Specific Question:   Supervising Provider    Answer:   JEGEDE, OLUGBEMIGA E [[id number]]   [name] Renfrow Moore Sherlynn Tourville  APRN, MSN, FNP-C [facility] [address]  [place], [place] [address] [phone]  

## 2022-01-16 ENCOUNTER — Encounter (HOSPITAL_BASED_OUTPATIENT_CLINIC_OR_DEPARTMENT_OTHER): Payer: Medicaid Other | Admitting: General Surgery

## 2022-01-16 DIAGNOSIS — L97818 Non-pressure chronic ulcer of other part of right lower leg with other specified severity: Secondary | ICD-10-CM | POA: Diagnosis not present

## 2022-01-16 NOTE — Progress Notes (Signed)
KAYLN, GARCEAU ([id number]) Visit Report for 01/16/2022 Arrival Information Details Patient Name: Date of Service: [name] [name] 01/16/2022 8:45 A M Medical Record Number: [id number] Patient Account Number: [id number] Date of Birth/Sex: Treating RN: 03-21-1971 (51 y.o. Donney Rankins, Lovena Le Primary Care Andriy Sherk: [name] Other Clinician: Referring Ronita Hargreaves: Treating Teia Freitas/Extender: Elyse Jarvis Weeks in Treatment: 5 Visit Information History Since Last Visit All ordered tests and consults were completed: Yes Patient Arrived: Cane Added or deleted any medications: No Arrival Time: 08:49 Any new allergies or adverse reactions: No Accompanied By: self Had a fall or experienced change in No Transfer Assistance: None activities of daily living that may affect Patient Identification Verified: Yes risk of falls: Secondary Verification Process Completed: Yes Signs or symptoms of abuse/neglect since last visito No Patient Requires Transmission-Based Precautions: No Hospitalized since last visit: No Patient Has Alerts: No Implantable device outside of the clinic excluding No cellular tissue based products placed in the center since last visit: Has Dressing in Place as Prescribed: Yes Has Compression in Place as Prescribed: Yes Pain Present Now: Yes Electronic Signature(s) Signed: 01/16/2022 5:02:02 PM By: Blanche East RN Entered By: Blanche East on 01/16/2022 09:13:12 -------------------------------------------------------------------------------- Encounter Discharge Information Details Patient Name: Date of Service: [name], [name] 01/16/2022 8:45 A M Medical Record Number: [id number] Patient Account Number: [id number] Date of Birth/Sex: Treating RN: June 11, 1970 (51 y.o. [name] Primary Care Renu Asby: [name] Other Clinician: Referring Tinleigh Whitmire: Treating Kerstyn Coryell/Extender: Elyse Jarvis Weeks in Treatment:  39 Encounter Discharge Information Items Post Procedure Vitals Discharge Condition: Stable Temperature (F): 98.3 Ambulatory Status: Cane Pulse (bpm): 94 Discharge Destination: Home Respiratory Rate (breaths/min): 18 Transportation: Private Auto Blood Pressure (mmHg): 134/61 Accompanied By: self Schedule Follow-up Appointment: Yes Clinical Summary of Care: Electronic Signature(s) Signed: 01/16/2022 5:02:02 PM By: Blanche East RN Entered By: Blanche East on 01/16/2022 09:39:11 -------------------------------------------------------------------------------- Lower Extremity Assessment Details Patient Name: Date of Service: [name], [place] [name] 01/16/2022 8:45 A M Medical Record Number: [id number] Patient Account Number: [id number] Date of Birth/Sex: Treating RN: 03/20/1971 (51 y.o. [name] Primary Care Roshard Rezabek: [name] Other Clinician: Referring Palmer Fahrner: Treating Mitzi Lilja/Extender: Elyse Jarvis Weeks in Treatment: 39 Edema Assessment Assessed: [Left: No] [Right: No] Edema: [Left: Ye] [Right: s] Calf Left: Right: Point of Measurement: 33 cm From Medial Instep 28.3 cm 29.4 cm Ankle Left: Right: Point of Measurement: 10 cm From Medial Instep 18.2 cm 20.1 cm Vascular Assessment Pulses: Dorsalis Pedis Palpable: [Right:Yes] Electronic Signature(s) Signed: 01/16/2022 4:43:51 PM By: Adline Peals Entered By: Adline Peals on 01/16/2022 09:01:10 -------------------------------------------------------------------------------- Multi Wound Chart Details Patient Name: Date of Service: [name], [name] 01/16/2022 8:45 A M Medical Record Number: [id number] Patient Account Number: [id number] Date of Birth/Sex: Treating RN: [birth year] (51 y.o. [name] Primary Care Dayanara Sherrill: [name] Other Clinician: Referring Chai Verdejo: Treating Finlay Godbee/Extender: Elyse Jarvis Weeks in Treatment: 39 Vital  Signs Height(in): 67 Pulse(bpm): 94 Weight(lbs): Blood Pressure(mmHg): 134/61 Body Mass Index(BMI): Temperature(F): 98.3 Respiratory Rate(breaths/min): 18 Photos: [17:Right, Lateral Lower Leg] [21:Right, Medial Ankle] [N/A:N/A N/A] Wound Location: [17:Gradually Appeared] [21:Gradually Appeared] [N/A:N/A] Wounding Event: [17:Sickle Cell Lesion] [21:Sickle Cell Lesion] [N/A:N/A] Primary Etiology: [17:N/A] [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] [N/A:N/A] Secondary Etiology: [17:Anemia, Sickle Cell Disease,] [21:Anemia, Sickle Cell Disease,] [N/A:N/A] Comorbid History: [17:Neuropathy 10/05/2012] [21:Neuropathy 06/26/2021] [N/A:N/A] Date Acquired: [17:39] [21:29] [N/A:N/A] Weeks of Treatment: [17:Open] [21:Open] [N/A:N/A] Wound Status: [17:No] [21:No] [N/A:N/A] Wound Recurrence: [17:Yes] [21:No] [N/A:N/A] Clustered Wound: [17:9.8x6.9x0.1] [21:7x2x0.1] [N/A:N/A] Measurements  L x W x D (cm) [46:96.295] [21:10.996] [N/A:N/A] A (cm) : rea [17:5.311] [21:1.1] [N/A:N/A] Volume (cm) : [17:71.70%] [21:62.10%] [N/A:N/A] % Reduction in A [17:rea: 71.70%] [21:62.00%] [N/A:N/A] % Reduction in Volume: [17:Full Thickness Without Exposed] [21:Full Thickness Without Exposed] [N/A:N/A] Classification: [17:Support Structures Large] [21:Support Structures Medium] [N/A:N/A] Exudate A mount: [17:Serosanguineous] [21:Serosanguineous] [N/A:N/A] Exudate Type: [17:red, brown] [21:red, brown] [N/A:N/A] Exudate Color: [17:Distinct, outline attached] [21:Distinct, outline attached] [N/A:N/A] Wound Margin: [17:Small (1-33%)] [21:Small (1-33%)] [N/A:N/A] Granulation A mount: [17:Pink, Pale] [21:Pink] [N/A:N/A] Granulation Quality: [17:Large (67-100%)] [21:Large (67-100%)] [N/A:N/A] Necrotic A mount: [17:Fat Layer (Subcutaneous Tissue): Yes Fat Layer (Subcutaneous Tissue): Yes N/A] Exposed Structures: [17:Fascia: No Tendon: No Muscle: No Joint: No Bone: No Medium (34-66%)] [21:Fascia: No Tendon: No Muscle: No Joint: No Bone: No  Medium (34-66%)] [N/A:N/A] Epithelialization: [17:Debridement - Selective/Open Wound Debridement - Selective/Open Wound N/A] Debridement: Pre-procedure Verification/Time Out 09:12 [21:09:12] [N/A:N/A] Taken: [17:Lidocaine 5% topical ointment] [21:Lidocaine 5% topical ointment] [N/A:N/A] Pain Control: [17:Slough] [21:Slough] [N/A:N/A] Tissue Debrided: [17:Non-Viable Tissue] [21:Non-Viable Tissue] [N/A:N/A] Level: [17:67.62] [21:14] [N/A:N/A] Debridement A (sq cm): [17:rea Curette] [21:Curette] [N/A:N/A] Instrument: [17:Minimum] [21:Minimum] [N/A:N/A] Bleeding: [17:Pressure] [21:Pressure] [N/A:N/A] Hemostasis A chieved: [17:Procedure was tolerated well] [21:Procedure was tolerated well] [N/A:N/A] Debridement Treatment Response: [17:9.8x6.9x0.1] [21:7x2x0.1] [N/A:N/A] Post Debridement Measurements L x W x D (cm) [17:5.311] [21:1.1] [N/A:N/A] Post Debridement Volume: (cm) [17:Debridement] [21:Debridement] [N/A:N/A] Treatment Notes Electronic Signature(s) Signed: 01/16/2022 9:21:07 AM By: Fredirick Maudlin MD FACS Signed: 01/16/2022 5:02:02 PM By: Blanche East RN Entered By: Fredirick Maudlin on 01/16/2022 09:21:07 -------------------------------------------------------------------------------- Multi-Disciplinary Care Plan Details Patient Name: Date of Service: [name], [name] 01/16/2022 8:45 A M Medical Record Number: [id number] Patient Account Number: [id number] Date of Birth/Sex: Treating RN: June 19, 1970 (51 y.o. [name] Primary Care Caralina Nop: [name] Other Clinician: Referring Raymonda Pell: Treating Warnell Rasnic/Extender: Elyse Jarvis Weeks in Treatment: 39 Multidisciplinary Care Plan reviewed with physician Active Inactive Venous Leg Ulcer Nursing Diagnoses: Actual venous Insuffiency (use after diagnosis is confirmed) Knowledge deficit related to disease process and management Goals: Patient will maintain optimal edema control Date Initiated:  06/26/2021 Target Resolution Date: 02/15/2022 Goal Status: Active Interventions: Assess peripheral edema status every visit. Compression as ordered Treatment Activities: Therapeutic compression applied : 06/26/2021 Notes: Wound/Skin Impairment Nursing Diagnoses: Impaired tissue integrity Goals: Patient/caregiver will verbalize understanding of skin care regimen Date Initiated: 04/17/2021 Target Resolution Date: 02/15/2022 Goal Status: Active Ulcer/skin breakdown will have a volume reduction of 30% by week 4 Date Initiated: 04/17/2021 Date Inactivated: 05/29/2021 Target Resolution Date: 05/15/2021 Goal Status: Met Ulcer/skin breakdown will have a volume reduction of 50% by week 8 Date Initiated: 05/29/2021 Date Inactivated: 06/26/2021 Target Resolution Date: 06/26/2021 Goal Status: Unmet Unmet Reason: venous reflux Interventions: Assess patient/caregiver ability to obtain necessary supplies Assess patient/caregiver ability to perform ulcer/skin care regimen upon admission and as needed Assess ulceration(s) every visit Provide education on ulcer and skin care Treatment Activities: Topical wound management initiated : 04/17/2021 Notes: 06/08/21: Left leg wounds greater than 30% volume reduction, right leg acute infection. Electronic Signature(s) Signed: 01/16/2022 4:43:51 PM By: [name] By: Adline Peals on 01/16/2022 09:03:13 -------------------------------------------------------------------------------- Pain Assessment Details Patient Name: Date of Service: [name],  [name] 01/16/2022 8:45 A M Medical Record Number: [id number] Patient Account Number: [id number] Date of Birth/Sex: Treating RN: [birth year] (51 y.o. [name] Primary Care Sahan Pen: [name] Other Clinician: Referring Herbert Marken: Treating Brittainy Bucker/Extender: Elyse Jarvis Weeks in Treatment: 39 Active Problems Location of Pain Severity and Description of  Pain Patient  Has Paino Yes Site Locations Pain Location: Pain Location: Pain in Ulcers Rate the pain. Current Pain Level: 4 Pain Management and Medication Current Pain Management: Electronic Signature(s) Signed: 01/16/2022 4:43:51 PM By: Adline Peals Entered By: Adline Peals on 01/16/2022 09:01:19 -------------------------------------------------------------------------------- Patient/Caregiver Education Details Patient Name: Date of Service: [name], [name] 8/30/2023andnbsp8:45 [place] Record Number: [id number] Patient Account Number: [id number] Date of Birth/Gender: Treating RN: Nov 28, 1970 (51 y.o. [name] Primary Care Physician: [name] Other Clinician: Referring Physician: Treating Physician/Extender: Elyse Jarvis Weeks in Treatment: 81 Education Assessment Education Provided To: Patient Education Topics Provided Wound/Skin Impairment: Methods: Explain/Verbal Responses: Reinforcements needed, State content correctly Electronic Signature(s) Signed: 01/16/2022 4:43:51 PM By: Adline Peals Entered By: Adline Peals on 01/16/2022 09:03:24 -------------------------------------------------------------------------------- Wound Assessment Details Patient Name: Date of Service: [name], [name] 01/16/2022 8:45 A M Medical Record Number: [id number] Patient Account Number: [id number] Date of Birth/Sex: Treating RN: 20-Jan-1971 (51 y.o. [name] Primary Care Britini Garcilazo: [name] Other Clinician: Referring Emmalynne Courtney: Treating Mariene Dickerman/Extender: Elyse Jarvis Weeks in Treatment: 39 Wound Status Wound Number: 17 Primary Etiology: Sickle Cell Lesion Wound Location: Right, Lateral Lower Leg Wound Status: Open Wounding Event: Gradually Appeared Comorbid History: Anemia, Sickle Cell Disease, Neuropathy Date Acquired: 10/05/2012 Weeks Of Treatment: 39 Clustered Wound:  Yes Photos Wound Measurements Length: (cm) 9.8 Width: (cm) 6.9 Depth: (cm) 0.1 Area: (cm) 53.109 Volume: (cm) 5.311 % Reduction in Area: 71.7% % Reduction in Volume: 71.7% Epithelialization: Medium (34-66%) Tunneling: No Undermining: No Wound Description Classification: Full Thickness Without Exposed Support Structures Wound Margin: Distinct, outline attached Exudate Amount: Large Exudate Type: Serosanguineous Exudate Color: red, brown Foul Odor After Cleansing: No Slough/Fibrino Yes Wound Bed Granulation Amount: Small (1-33%) Exposed Structure Granulation Quality: Pink, Pale Fascia Exposed: No Necrotic Amount: Large (67-100%) Fat Layer (Subcutaneous Tissue) Exposed: Yes Necrotic Quality: Adherent Slough Tendon Exposed: No Muscle Exposed: No Joint Exposed: No Bone Exposed: No Treatment Notes Wound #17 (Lower Leg) Wound Laterality: Right, Lateral Cleanser Soap and Water Discharge Instruction: May shower and wash wound with dial antibacterial soap and water prior to dressing change. Wound Cleanser Discharge Instruction: Cleanse the wound with wound cleanser prior to applying a clean dressing using gauze sponges, not tissue or cotton balls. Peri-Wound Care Triamcinolone 15 (g) Discharge Instruction: Use triamcinolone 15 (g) as directed Sween Lotion (Moisturizing lotion) Discharge Instruction: Apply moisturizing lotion as directed Topical Mupirocin Ointment Discharge Instruction: Apply Mupirocin (Bactroban) as instructed Primary Dressing Promogran Prisma Matrix, 4.34 (sq in) (silver collagen) Discharge Instruction: Moisten collagen with saline or hydrogel Secondary Dressing ABD Pad, 5x9 Discharge Instruction: Apply over primary dressing as directed. Zetuvit Plus 4x8 in Discharge Instruction: Apply over primary dressing as directed. Secured With Compression Wrap Kerlix Roll 4.5x3.1 (in/yd) Discharge Instruction: Apply Kerlix and Coban compression as  directed. Coban Self-Adherent Wrap 4x5 (in/yd) Discharge Instruction: Apply over Kerlix as directed. Compression Stockings Add-Ons Electronic Signature(s) Signed: 01/16/2022 4:43:51 PM By: Adline Peals Entered By: Adline Peals on 01/16/2022 09:05:10 -------------------------------------------------------------------------------- Wound Assessment Details Patient Name: Date of Service: [name], [name] 01/16/2022 8:45 A M Medical Record Number: [id number] Patient Account Number: [id number] Date of Birth/Sex: Treating RN: August 05, 1970 (51 y.o. [name] Primary Care Dereona Kolodny: [name] Other Clinician: Referring Nihaal Friesen: Treating Ruth Tully/Extender: Elyse Jarvis Weeks in Treatment: 39 Wound Status Wound Number: 21 Primary Etiology: Sickle Cell Lesion Wound Location: Right, Medial Ankle Secondary Etiology: Venous Leg Ulcer Wounding Event: Gradually Appeared Wound Status: Open  Date Acquired: 06/26/2021 Comorbid History: Anemia, Sickle Cell Disease, Neuropathy Weeks Of Treatment: 29 Clustered Wound: No Photos Wound Measurements Length: (cm) 7 Width: (cm) 2 Depth: (cm) 0.1 Area: (cm) 10.996 Volume: (cm) 1.1 % Reduction in Area: 62.1% % Reduction in Volume: 62% Epithelialization: Medium (34-66%) Tunneling: No Undermining: No Wound Description Classification: Full Thickness Without Exposed Support Structures Wound Margin: Distinct, outline attached Exudate Amount: Medium Exudate Type: Serosanguineous Exudate Color: red, brown Wound Bed Granulation Amount: Small (1-33%) Granulation Quality: Pink Necrotic Amount: Large (67-100%) Necrotic Quality: Adherent Slough Foul Odor After Cleansing: No Slough/Fibrino Yes Exposed Structure Fascia Exposed: No Fat Layer (Subcutaneous Tissue) Exposed: Yes Tendon Exposed: No Muscle Exposed: No Joint Exposed: No Bone Exposed: No Treatment Notes Wound #21 (Ankle) Wound Laterality:  Right, Medial Cleanser Soap and Water Discharge Instruction: May shower and wash wound with dial antibacterial soap and water prior to dressing change. Wound Cleanser Discharge Instruction: Cleanse the wound with wound cleanser prior to applying a clean dressing using gauze sponges, not tissue or cotton balls. Peri-Wound Care Triamcinolone 15 (g) Discharge Instruction: Use triamcinolone 15 (g) as directed Sween Lotion (Moisturizing lotion) Discharge Instruction: Apply moisturizing lotion as directed Topical Mupirocin Ointment Discharge Instruction: Apply Mupirocin (Bactroban) as instructed Primary Dressing Promogran Prisma Matrix, 4.34 (sq in) (silver collagen) Discharge Instruction: Moisten collagen with saline or hydrogel Secondary Dressing ABD Pad, 5x9 Discharge Instruction: Apply over primary dressing as directed. Zetuvit Plus 4x8 in Discharge Instruction: Apply over primary dressing as directed. Secured With Compression Wrap Kerlix Roll 4.5x3.1 (in/yd) Discharge Instruction: Apply Kerlix and Coban compression as directed. Coban Self-Adherent Wrap 4x5 (in/yd) Discharge Instruction: Apply over Kerlix as directed. Compression Stockings Add-Ons Electronic Signature(s) Signed: 01/16/2022 4:43:51 PM By: Adline Peals Entered By: Adline Peals on 01/16/2022 09:05:28 -------------------------------------------------------------------------------- Vitals Details Patient Name: Date of Service: [name], [name] 01/16/2022 8:45 A M Medical Record Number: [id number] Patient Account Number: [id number] Date of Birth/Sex: Treating RN: 03/28/1971 (51 y.o. [name] Primary Care Destane Speas: [name] Other Clinician: Referring Damin Salido: Treating Maryln Eastham/Extender: Elyse Jarvis Weeks in Treatment: 39 Vital Signs Time Taken: 08:51 Temperature (F): 98.3 Height (in): 67 Pulse (bpm): 94 Respiratory Rate (breaths/min): 18 Blood Pressure  (mmHg): 134/61 Reference Range: 80 - 120 mg / dl Electronic Signature(s) Signed: 01/16/2022 4:43:51 PM By: Adline Peals Entered By: Adline Peals on 01/16/2022 08:52:31

## 2022-01-16 NOTE — Progress Notes (Signed)
CONCEPTION, DOEBLER ([id number]) Visit Report for 01/16/2022 Chief Complaint Document Details Patient Name: Date of Service: [name] [name] 01/16/2022 8:45 A M Medical Record Number: [id number] Patient Account Number: [id number] Date of Birth/Sex: Treating RN: 1971/04/15 (51 y.o. [name] Primary Care Provider: Cammie [name] Other Clinician: Referring Provider: Treating Provider/Extender: [name] in Treatment: 27 Information Obtained from: Patient Chief Complaint the patient is here for evaluation of her bilateral lower extremity [name] cell ulcers 04/17/2021; patient comes in for substantial wounds on the right and left lower leg Electronic Signature(s) Signed: 01/16/2022 9:21:18 AM By: [name] Maudlin MD FACS Entered By: [name] on 01/16/2022 09:21:17 -------------------------------------------------------------------------------- Debridement Details Patient Name: Date of Service: [name] [name] 01/16/2022 8:45 A M Medical Record Number: [id number] Patient Account Number: [id number] Date of Birth/Sex: Treating RN: Jul 23, 1970 (51 y.o. [name] Primary Care Provider: Cammie [name] Other Clinician: Referring Provider: Treating Provider/Extender: [name] in Treatment: 39 Debridement Performed for Assessment: Wound #17 Right,Lateral Lower Leg Performed By: Physician [name] Maudlin, MD Debridement Type: Debridement Level of Consciousness (Pre-procedure): Awake and Alert Pre-procedure Verification/Time Out Yes - 09:12 Taken: Start Time: 09:13 Pain Control: Lidocaine 5% topical ointment T Area Debrided (L x W): otal 9.8 (cm) x 6.9 (cm) = 67.62 (cm) Tissue and other material debrided: Non-Viable, Slough, Slough Level: Non-Viable Tissue Debridement Description: Selective/Open Wound Instrument: Curette Bleeding: Minimum Hemostasis Achieved: Pressure Response to Treatment: Procedure was  tolerated well Level of Consciousness (Post- Awake and Alert procedure): Post Debridement Measurements of Total Wound Length: (cm) 9.8 Width: (cm) 6.9 Depth: (cm) 0.1 Volume: (cm) 5.311 Character of Wound/Ulcer Post Debridement: Requires Further Debridement Post Procedure Diagnosis Same as Pre-procedure Electronic Signature(s) Signed: 01/16/2022 10:05:10 AM By: [name] Maudlin MD FACS Signed: 01/16/2022 5:02:02 PM By: [name] East RN Entered By: [name] on 01/16/2022 09:14:54 -------------------------------------------------------------------------------- Debridement Details Patient Name: Date of Service: [name] [name] 01/16/2022 8:45 A M Medical Record Number: [id number] Patient Account Number: [id number] Date of Birth/Sex: Treating RN: 1971-03-13 (51 y.o. [name] Primary Care Provider: Cammie [name] Other Clinician: Referring Provider: Treating Provider/Extender: [name] in Treatment: 39 Debridement Performed for Assessment: Wound #21 Right,Medial Ankle Performed By: Physician [name] Maudlin, MD Debridement Type: Debridement Severity of Tissue Pre Debridement: Fat layer exposed Level of Consciousness (Pre-procedure): Awake and Alert Pre-procedure Verification/Time Out Yes - 09:12 Taken: Start Time: 09:13 Pain Control: Lidocaine 5% topical ointment T Area Debrided (L x W): otal 7 (cm) x 2 (cm) = 14 (cm) Tissue and other material debrided: Non-Viable, Slough, Slough Level: Non-Viable Tissue Debridement Description: Selective/Open Wound Instrument: Curette Bleeding: Minimum Hemostasis Achieved: Pressure Response to Treatment: Procedure was tolerated well Level of Consciousness (Post- Awake and Alert procedure): Post Debridement Measurements of Total Wound Length: (cm) 7 Width: (cm) 2 Depth: (cm) 0.1 Volume: (cm) 1.1 Character of Wound/Ulcer Post Debridement: Requires Further Debridement Severity of Tissue  Post Debridement: Fat layer exposed Post Procedure Diagnosis Same as Pre-procedure Electronic Signature(s) Signed: 01/16/2022 10:05:10 AM By: [name] Maudlin MD FACS Signed: 01/16/2022 5:02:02 PM By: [name] East RN Entered By: [name] on 01/16/2022 09:15:24 -------------------------------------------------------------------------------- HPI Details Patient Name: Date of Service: [name] [name] 01/16/2022 8:45 A M Medical Record Number: [id number] Patient Account Number: [id number] Date of Birth/Sex: Treating RN: 07-27-1970 (51 y.o. [name] Primary Care Provider: Cammie [name] Other Clinician: Referring Provider: Treating Provider/Extender: [name] in Treatment: 92 History  of Present Illness Location: medial and lateral ankle region on the right and left medial malleolus Quality: Patient reports experiencing a shooting pain to affected area(s). Severity: Patient states wound(s) are getting worse. Duration: right lower extremity bimalleolar ulcers have been present for approximately 2 years; the rright meedial malleolus ulcer has been there proximally 6 months Timing: Pain in wound is constant (hurts all the time) Context: The wound would happen gradually ssociated Signs and Symptoms: Patient reports having increase discharge. 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 Leland [name] for a surgical debridement as I was unable to do anything in the office due to excruciating pain. At that stage she was referred from the plastic surgery service to dermatology who treated her for a skin infection with doxycycline and then Levaquin and a local antibiotic ointment. I understand the patient has since developed ulceration on the left ankle both medial and lateral and was now referred back to the wound center  as dermatology has finished the management. 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 She has been visiting the wound center [facility] and was seen by Dr. Dellia Nims and Dr. Leland [name]. after prolonged conservator therapy between July 2016 and January 2017. She had been seen by the plastic surgeon and taken to the OR for debridement and application of Theraskin. She had 3 applications of Theraskin and was then treated with collagen. Prior to that she had a history of similar problems in 2014 and was treated conservatively. Had a reflux study done for the right lower extremity in August 2016 without reflux or DVT . Past medical history significant for [name] cell disease, anemia, leg ulcers, cholelithiasis,and has never been a smoker. Once the patient was discharged on the wound center she says within 2 or 3 [name] the problems recurred and she has been treating it conservatively. since I saw her 3 [name] ago at [facility] she has been unable to get her dressing material but has completed a course of doxycycline. 6/7/ 2017 -- lower extremity venous duplex reflux evaluation was done No evidence of SVT or DVT in the RLL. No venous incompetence in the RLL. No further vascular workup is indicated at this time. She was seen by Dr. [name], on 10/04/2015. She agreed with the plan of taking her to the OR for debridement and application of theraskin and would also take biopsies to rule out pyoderma gangrenosum. Follow-up note dated May 31 received and she was status post application of Theraskin to multiple ulcers around the right ankle. Pathology did not show evidence of malignancy or pyoderma gangrenosum. She would continue to see as in the wound clinic for further care and see Dr.  Leland [name] as needed. The patient brought the biopsy report and it was consistent with stasis ulcer no evidence of malignancy and the comment was that there was some adjacent neovascularization, fibrosis and patchy perivascular chronic inflammation. 11/15/2015 -- today we applied her first application of Theraskin 11/30/15; TheraSkin #2 12/13/2015 -- she is having a lot of pain locally and is here for possible application of a theraskin today. 01/16/2016 -- the patient has significant pain and has noticed despite in spite  of all local care and oral pain medication. It is impossible to debride her in the office. 02/06/2016 -- I do not see any notes from Dr. Iran Planas( the patient has not made a call to the office know as she heard from them) and the only visit to recently was with her PCP Dr. Danella Penton -- I saw her on 01/16/2016 and prescribed 90 tablets of oxycodone 10 mg and did lab work and screening for HIV. the HIV was negative and hemoglobin was 6.3 with a WBC count of 14.9 and hematocrit of 17.8 with platelets of 561. reticulocyte count was 15.5% READMISSION: 07/10/2016- The patient is here for readmission for bilateral lower extremity ulcers in the presence of [name] cell. The bimalleolar ulcers to the right lower extremity have been present for approximately 2 years, the left medial malleolus ulcer has been present approximately 6 months. She has followed with Dr.Thimmappa in the past and has had a total of 3 applications of Theraskin (01/2015, 09/2015, 06/17/16). She has also followed with Dr. Con Memos here in the clinic and has received 2 applications of TheraSkin (11/10/15, 11/30/15). The patient does experience chronic, and is not amenable to debridement. She had a [name] cell crisis in December 2017, prior to that has been several years. She is not currently on any antibiotic therapy and has not been treated with any recently. 07/17/2016 -- was seen by Dr. Iran Planas of plastic surgery who saw  her 2 [name] postop application of Theraskin #3. She had removed her dressing and asked her to apply silver alginate on alternate days and follow-up back with the wound center. Future debridements and application of skin substitute would have to be done in the hospital due to her high risk for anesthesia. READMISSION 04/17/2021 Patient is now a 51 year old woman that we have had in this clinic for a prolonged period of time and 2016-2017 and then again for 2 visits in February 2018. At that point she had wounds on the right lower leg predominantly medial. She had also been seen by plastic surgery Dr. Leland [name] who I believe took her to the OR for operative debridement and application of TheraSkin in 2017. After she left our clinic she was followed for a very prolonged period of time in the wound care center in [facility] who then referred her ultimately to [facility] where she was seen by Dr. Vernona Rieger. Again taken her to the OR for skin grafting which apparently did not take. She had multiple other attempts at dressings although I have not really looked over all of these notes in great detail. She has not been seen in a wound care center in about a year. She states over the last year in addition to her right lower leg she has developed wounds on the left lower leg quite extensive. She is using Xeroform to all of these wounds without really any improvement. She also has Medicaid which does not cover wound products. The patient has had vascular work-ups in the past including most recently on 03/28/2021 showing biphasic waveforms on the right triphasic at the PTA and biphasic at the dorsalis pedis on the left. She was unable to tolerate any degree of compression to do ABIs. Unfortunately TBI's were also not done. She had venous reflux studies done in 2017. This did not show any evidence of a DVT or SVT and no venous incompetence was noted in the right leg at the time this was the only side with the wound As  noted I did  not look all over her old records. She apparently had a course of HBO and [facility] although I am not sure what the indication would have been. In any case she developed seizures and terminated treatment earlier. She is generally much more disabled than when we last saw her in clinic. She can no longer walk pretty much wheelchair-bound because predominantly of pain in the left hip. 04/24/2021; the patient tolerated the wraps we put on. We used Santyl and Hydrofera Blue under compression. I brought her back for a nurse visit for a change in dressing. With Medicaid we will have a hard time getting anything paid for and hence the need for compression. She arrives in clinic with all the wounds looking somewhat better in terms of surface 12/20; circumferential wound on the right from the lateral to the medial. She has open areas on the left medial and left lateral x2 on all of this with the same surface. This does not look completely healthy although she does have some epithelialization. She is not complaining of a lot of pain which is unusual for her [name] ulcers. I have not looked over her extensive records from [facility]. She had recent arterial studies and has a history of venous reflux studies I will need to look these over although I do not believe she has significant arterial disease 2023 05/22/2021; patient's wound areas measure slightly smaller. Still a lot of drainage coming from the right we have been using Hydrofera Blue and Santyl with some improvement in the wound surfaces. 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 This was in November and showed biphasic and triphasic waveforms but she could not tolerate ABIs because of pressure and unfortunately TBI's were not done. She has not had recent venous reflux studies that I can see 1/10; not much change about  the same surface area. This has a yellowish surface to it very gritty. We have been using Santyl and Hydrofera Blue for a prolonged period. Culture I did last week showed methicillin sensitive staph aureus "rare". Our intake nurse reports greenish drainage which may be the Hydrofera Blue itself 1/17; wounds are continue to measure smaller although I am not sure about the accuracy here. Especially the areas on the right are covered in what looks to be a nonviable surface although she does have some epithelialization. Similarly she has areas on the left medial and left lateral ankle area which appear to have a better surface and perhaps are slightly smaller. We have been using Santyl and Hydrofera Blue. She cannot tolerate mechanical debridements She went for her reflux studies which showed significant reflux at the greater saphenous vein at the saphenofemoral junction as well as the greater saphenous vein in the proximal calf on the left she had reflux in the thigh and the common femoral vein and supra vein Fishel vein reflux in the greater saphenous vein. I will have vein and vascular look at this. My thoughts have been that these are likely [name] wounds. I looked through her old records from [facility] wound care center and then when she graduated to [facility] wound care center where she saw Dr. Zigmund Daniel and Dr. Vernona Rieger. Although I can see she had reflux studies done I do not see that she actually saw a vein and vascular. I went over the fact that she had operative debridements and  actual skin grafting that did not take. I do not think these wounds have ever really progressed towards healing 1/31;Substantial wounds on the right ankle area. Hyper granulated very gritty adherent debris on the surface. 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 On the left she has no DVT or SVT deep vein reflux in the common femoral vein. Superficial vein reflux in the greater saphenous vein on although the vein diameter is not really all that large. I do not think there is anything that can be done with these although I am going to send her for consultation to vein and vascular. 2/7; Wound exam; substantial wound area on the right posterior ankle area and areas on the left medial ankle and left lateral ankle. I was able to debride the left medial ankle last week fairly aggressively and it is back this week to a completely nonviable surface She will see vascular surgery this Friday and I would like them to review the venous studies and also any comments on her arterial status. If they do not see an issue here I am going to refer her to plastic surgery for an operative debridement perhaps intraoperative ACell or Integra. Eventually she will require a deep tissue culture again 2/14; substantial wound area on the right posterior ankle, medial ankle. We have been using silver alginate The patient was seen by vein and vascular she had both venous reflux studies and arterial studies. 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 She had no evidence of superficial venous thrombosis. She did have superficial vein reflux at the saphenofemoral junction and the greater saphenous vein. On the left no evidence of a DVT no evidence of superficial venous throat thrombosis she did have deep vein reflux in the common femoral vein and superficial vein reflux in the greater saphenous vein but these were not felt to be amenable to ablation. In terms of arterial studies she had triphasic and wife biphasic  wave waveforms bilaterally not felt to have a significant arterial issue. I do not get the feeling that they felt that any part of her nonhealing wounds were related to either arterial or venous issues. They did note that she had venous reflux at the right at the [facility] and [facility]. And also on the left there were reflux in the deep system at the common femoral vein and greater saphenous vein in the proximal thigh. Nothing amenable to ablation. 2/20; she is making some decent progress on the right where there is nice skin between the 2 open areas on the right ankle. The surfaces here do not look viable yet there is some surrounding epithelialization. She still has a small area on the left medial ankle area. Hyper-granulated Jody's away always 2/28 patient has an appointment with plastic surgery on 3/8. We will see her back on 3/9. She may have to call us to get the area redressed. We've been using Santyl under silver alginate. We made a nice improvement on the left medial ankle. The larger wounds on the right also looks somewhat  better in terms of epithelialization although I think they could benefit from an aggressive debridement if plastic surgery would be willing to do that. Perhaps placement of Integra or a cell 07/26/2021: She saw Dr. Claudia Desanctis yesterday. He raised the question as to whether or not this might be pyoderma and wanted to wait until that question was answered by dermatology before proceeding with any sort of operative debridement. We have continue to use Santyl under silver alginate with Kerlix and Coban wraps. Overall, her wounds appear to be continuing to contract and epithelialize, with some granulation tissue present. There continues to be some slough on all wound surfaces. 08/09/2021: She has not been able to get an appointment with dermatology because apparently the offices in [place] do not accept Medicaid. She is looking into whether or not she can be seen at the main [facility] dermatology clinic. This is necessary because plastic surgery is concerned that her wounds might represent pyoderma and they did not want to do any procedure until that was clarified. We have been using Santyl under silver alginate with Kerlix and Coban wraps. Today, there was a greater amount of drainage on her dressings with a slight green discoloration and significant odor. Despite this, her wounds continue to contract and epithelialize. There is pale granulation tissue present and actually, on the left medial ankle, the granulation tissue is a bit hypertrophic. 08/16/2021: Last week, I took a culture and this grew back rare methicillin-resistant Staph aureus and rare corynebacterium. The MRSA was sensitive to gentamicin which we began applying topically on an empiric basis. This week, her wounds are a bit smaller and the drainage and odor are less. Her primary care provider is working on assisting the patient with a dermatology evaluation. She has been in silver alginate over the gentamicin that was started last week along with Kerlix and Coban wraps. 08/23/2021: Because she has Medicaid, we have been unable to get her into see any dermatologist in the [place] to rule out pyoderma gangrenosum, which was a requirement from plastic surgery prior to any sort of debridement and grafting. Despite this, however, all of her wounds continue to get smaller. The wound on her left medial ankle is nearly closed. There is no odor from the wounds, although she still accumulates a modest amount of drainage on her dressings. 08/30/2021: The lateral right ankle wound and the medial left ankle wound are a bit smaller today. The medial right ankle wound is about the same size. They are less tender. We have still been unable to get her into dermatology. 09/06/2021: All of the wounds are about the same size today. She continues to endorse minimal pain. I communicated with Dr. Claudia Desanctis in plastic surgery  regarding our issues getting a dermatology appointment; he was out of town but indicated that he would look into perhaps performing the biopsy in his office and will have his office contact her. 09/14/2021: The patient has an appointment in dermatology, but it is not until October. Her wounds are roughly the same; she continues to have very thick purulent-looking drainage on her dressings. 09/20/2021: The left medial wound is nearly closed and just has a bit of accumulated eschar on the surface. The right medial and lateral ankle wounds are perhaps a little bit smaller. They continue to have a very pale surface with accumulation of thin slough. 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 I did not think [employer] was indicated based on  this. She is getting topical mupirocin with Prisma silver collagen. 10/04/2021: The patient was not seen in clinic last week due to childcare coverage issues. In the interim, the left medial leg wound has closed. The right sided leg wounds are smaller. There is more granulation tissue coming through, particularly on the lateral wound. The surface remains somewhat gritty. We have been applying topical mupirocin and Prisma silver collagen. 10/11/2021: The left medial leg wound remains closed. She does complain of some anesthetic sensation to the area. Both of the right-sided leg wounds are smaller but still have accumulated slough. 10/18/2021: Both right-sided leg wounds are minimally smaller this week. She still continues to accumulate slough and has thick drainage on her dressings. 10/23/2021: Both wounds continue to contract. There is still slough buildup. She has been approved for a keratin-based skin substitute trial product but it will not be available until next week. 10/30/2021: The wounds are about the same to perhaps slightly smaller. There is still continued slough buildup. Unfortunately, the rep for the keratin based product did not show up  today and did not answer his phone when called. 11/08/2021: The wounds are little bit smaller today. She continues to have thick drainage but the surfaces are relatively clean with just a little bit of slough accumulation. She reported to me today that she is unable to completely flex her left ankle and on examination it seems this is potentially related to scar tissue from her wounds. We do have the ProgenaMatrix trial product available for her today. 11/15/2021: Both wounds are smaller today. There is some slough accumulation on the surfaces, but the medial wound, in particular looks like it is filling in and is less deep. She did hear from physical therapy and she is going to start working with them on July 11. She is here for her second application of the trial skin substitute, ProgenaMatrix. 11/22/2021: Both wounds continue to contract, the medial more dramatically than the lateral. Both wounds have a layer of slough on the surface, but underneath this, the gritty fibrous tissue has a little bit more of a pink cast to it rather than being as pale as it has been. 11/29/2021: The wounds are roughly the same size this week, perhaps a millimeter or 2 smaller. The medial wound has filled in and is nearly flush with the surrounding skin surface. She continues to have a lot of slough accumulation on both surfaces. 12/06/2021: No significant change to her wounds, but she has a new opening on her dorsal foot, just distal to the right lateral ankle wound. The area on her left medial ankle that reopened looks a little bit larger today. She has quite a bit of pain associated with the new wound. 12/12/2021: Her wounds look about the same but the new opening on her right lateral dorsal foot is a little bit bigger. She continues to have a fair amount of pain with this wound. 12/26/2021: The left medial ankle wound is tiny and superficial. She has 2 areas of crusting on her left lateral ankle, however, that appear to be  threatening to open again. Her right medial ankle wound is a little bit smaller today but still continues to accumulate thick rubbery slough. The new dorsal foot wound is exquisitely painful but there is no odor or purulent drainage. No erythema or induration. The right lateral ankle wound looks about the same today, again with thick rubbery slough. 01/03/2022: The left medial ankle wound has closed again. Both right ankle wounds  appear to be about the same size with thick rubbery slough. The dorsal foot wound on the right continues to be quite painful and she stated that she did not want any debridement of that site today. 01/10/2022: No real change to any of her wounds. She continues to accumulate thick slough. The dorsal foot wound has merged with the lateral malleolar wound. She is experiencing significant pain in the dorsal foot portion of the ulcer. 01/16/2022: Absolutely no change or progress in her wounds. Electronic Signature(s) Signed: 01/16/2022 9:34:27 AM By: [name] Maudlin MD FACS Entered By: [name] on 01/16/2022 09:34:27 -------------------------------------------------------------------------------- Physical Exam Details Patient Name: Date of Service: [name] [name] 01/16/2022 8:45 A M Medical Record Number: [id number] Patient Account Number: [id number] Date of Birth/Sex: Treating RN: [birth year] (51 y.o. [name] Primary Care Provider: Cammie [name] Other Clinician: Referring Provider: Treating Provider/Extender: [name] in Treatment: 39 Constitutional . . . . No acute distress.Marland Kitchen Respiratory Normal work of breathing on room air.. Notes 01/16/2022: Absolutely no change or progress in her wounds. Electronic Signature(s) Signed: 01/16/2022 9:35:18 AM By: [name] Maudlin MD FACS Entered By: [name] on 01/16/2022 09:35:18 -------------------------------------------------------------------------------- Physician  Orders Details Patient Name: Date of Service: [name] [name] 01/16/2022 8:45 A M Medical Record Number: [id number] Patient Account Number: [id number] Date of Birth/Sex: Treating RN: 09-02-1970 (51 y.o. 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 - Dr. Celine Ahr Rm 3 Return A Thursday 01/24/22 at 9:30am Anesthetic (In clinic) Topical Lidocaine 5% applied to wound bed (In clinic) Topical Lidocaine 4% applied to wound bed - In Clinic Bathing/ Shower/ Hygiene May shower with protection but do not get wound dressing(s) wet. - Can get cast protector bags at [facility] or [facility] Edema Control - Lymphedema / SCD / Other Elevate legs to the level of the heart or above for 30 minutes daily and/or when sitting, a frequency of: - throughout the day Avoid standing for long periods of time. Exercise regularly Additional Orders / Instructions Follow Nutritious Diet Wound Treatment Wound #17 - Lower Leg Wound Laterality: Right, Lateral Cleanser: Soap and Water 1 x Per Week/30 Days Discharge Instructions: May shower and wash wound with dial antibacterial soap and water prior to dressing change. Cleanser: Wound Cleanser 1 x Per Week/30 Days Discharge Instructions: Cleanse the wound with wound cleanser prior to applying a clean dressing using gauze sponges, not tissue or cotton balls. Peri-Wound Care: Triamcinolone 15 (g) 1 x Per Week/30 Days Discharge Instructions: Use triamcinolone 15 (g) as directed Peri-Wound Care: Sween Lotion (Moisturizing lotion) 1 x Per Week/30 Days Discharge Instructions: Apply moisturizing  lotion as directed Topical: Mupirocin Ointment 1 x Per Week/30 Days Discharge Instructions: Apply Mupirocin (Bactroban) as instructed Prim Dressing: Promogran Prisma Matrix, 4.34 (sq in) (silver collagen) 1 x Per Week/30 Days ary Discharge Instructions: Moisten collagen with saline or hydrogel Secondary Dressing: ABD Pad, 5x9 1 x Per Week/30 Days Discharge Instructions: Apply over primary dressing as directed. Secondary Dressing: Zetuvit Plus 4x8 in 1 x Per Week/30 Days Discharge Instructions: Apply over primary dressing as directed. Compression Wrap: Kerlix Roll 4.5x3.1 (in/yd) 1 x Per Week/30 Days Discharge Instructions: Apply Kerlix and Coban compression  as directed. Compression Wrap: Coban Self-Adherent Wrap 4x5 (in/yd) 1 x Per Week/30 Days Discharge Instructions: Apply over Kerlix as directed. Wound #21 - Ankle Wound Laterality: Right, Medial Cleanser: Soap and Water 1 x Per Week/30 Days Discharge Instructions: May shower and wash wound with dial antibacterial soap and water prior to dressing change. Cleanser: Wound Cleanser 1 x Per Week/30 Days Discharge Instructions: Cleanse the wound with wound cleanser prior to applying a clean dressing using gauze sponges, not tissue or cotton balls. Peri-Wound Care: Triamcinolone 15 (g) 1 x Per Week/30 Days Discharge Instructions: Use triamcinolone 15 (g) as directed Peri-Wound Care: Sween Lotion (Moisturizing lotion) 1 x Per Week/30 Days Discharge Instructions: Apply moisturizing lotion as directed Topical: Mupirocin Ointment 1 x Per Week/30 Days Discharge Instructions: Apply Mupirocin (Bactroban) as instructed Prim Dressing: Promogran Prisma Matrix, 4.34 (sq in) (silver collagen) 1 x Per Week/30 Days ary Discharge Instructions: Moisten collagen with saline or hydrogel Secondary Dressing: ABD Pad, 5x9 1 x Per Week/30 Days Discharge Instructions: Apply over primary dressing as directed. Secondary Dressing: Zetuvit Plus 4x8 in 1 x Per Week/30  Days Discharge Instructions: Apply over primary dressing as directed. Compression Wrap: Kerlix Roll 4.5x3.1 (in/yd) 1 x Per Week/30 Days Discharge Instructions: Apply Kerlix and Coban compression as directed. Compression Wrap: Coban Self-Adherent Wrap 4x5 (in/yd) 1 x Per Week/30 Days Discharge Instructions: Apply over Kerlix as directed. Electronic Signature(s) Signed: 01/16/2022 10:05:10 AM By: [name] Maudlin MD FACS Entered By: [name] on 01/16/2022 09:35:37 -------------------------------------------------------------------------------- Problem List Details Patient Name: Date of Service: [name] [name] 01/16/2022 8:45 A M Medical Record Number: [id number] Patient Account Number: [id number] Date of Birth/Sex: Treating RN: 1970/07/28 (51 y.o. 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 04/17/2021 No Yes Inactive Problems ICD-10 Code Description Active Date Inactive Date L97.828 Non-pressure chronic ulcer of other part of left lower leg with other specified severity 04/17/2021 04/17/2021 Resolved Problems Electronic Signature(s) Signed: 01/16/2022 9:21:00 AM By: [name] Maudlin MD FACS Entered By: [name] on 01/16/2022 09:21:00 -------------------------------------------------------------------------------- Progress Note Details Patient Name: Date of Service: [name] [name] 01/16/2022 8:45 A M Medical Record Number: [id number] Patient Account Number: [id number] Date of Birth/Sex: Treating RN: 03-20-1971 (51 y.o. [name] Primary Care Provider: Cammie [name] Other  Clinician: Referring Provider: Treating Provider/Extender: [name] in Treatment: 48 Subjective Chief Complaint Information obtained from Patient the patient is here for evaluation of her bilateral lower extremity [name] cell ulcers 04/17/2021; patient comes in for substantial wounds on the right and left lower leg History of Present Illness (HPI) The following HPI elements were documented for the patient's wound: Location: medial and lateral ankle region on the right and left medial malleolus Quality: Patient reports experiencing a shooting pain to affected area(s). Severity: Patient states wound(s) are getting worse. Duration: right lower extremity bimalleolar ulcers have been present for approximately 2 years; the rright meedial malleolus ulcer has been there proximally 6 months Timing: Pain in wound is constant (hurts all the time) Context: The wound would happen gradually Associated Signs and Symptoms: Patient reports having increase discharge. 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 Leland [name] for a surgical debridement as I was unable to do anything in the office due to excruciating pain. At that stage she was referred from the plastic surgery service to dermatology who treated her for a skin infection with doxycycline and then Levaquin and a local antibiotic ointment. I understand the patient has since developed ulceration on the left ankle both medial and lateral and was now referred back to the wound center as dermatology has finished the management. 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 She has been  visiting the wound center [facility] and was seen by Dr. Dellia Nims and Dr. Leland [name]. after prolonged conservator therapy between July 2016 and January 2017. She had been seen by the plastic surgeon and taken to the OR for debridement and application of Theraskin. She had 3 applications of Theraskin and was then treated with collagen. Prior to that she had a history of similar problems in 2014 and was treated conservatively. Had a reflux study done for the right lower extremity in August 2016 without reflux or DVT . Past medical history significant for [name] cell disease, anemia, leg ulcers, cholelithiasis,and has never been a smoker. Once the patient was discharged on the wound center she says within 2 or 3 [name] the problems recurred and she has been treating it conservatively. since I saw her 3 [name] ago at [facility] she has been unable to get her dressing material but has completed a course of doxycycline. 6/7/ 2017 -- lower extremity venous duplex reflux evaluation was done oo No evidence of SVT or DVT in the RLL. No venous incompetence in the RLL. No further vascular workup is indicated at this time. She was seen by Dr. [name], on 10/04/2015. She agreed with the plan of taking her to the OR for debridement and application of theraskin and would also take biopsies to rule out pyoderma gangrenosum. Follow-up note dated May 31 received and she was status post application of Theraskin to multiple ulcers around the right ankle. Pathology did not show evidence of malignancy or pyoderma gangrenosum. She would continue to see as in the wound clinic for further care and see Dr. Leland [name] as needed. The patient brought the biopsy report and it was consistent with stasis ulcer no evidence of malignancy and the comment was that there was some adjacent neovascularization, fibrosis and patchy perivascular chronic inflammation. 11/15/2015 -- today we applied her first application of  Theraskin 11/30/15; TheraSkin #2 12/13/2015 -- she is having a lot of pain locally and is here for possible application of a theraskin today. 01/16/2016 -- the patient has significant pain and has noticed despite in spite of all local care and oral pain medication. It is impossible to debride her in the office. 02/06/2016 -- I do not see any notes from Dr. Iran Planas( the patient has not made a call to the office know as she heard from them) and the only visit to recently was with her PCP Dr. Danella Penton -- I saw her on 01/16/2016 and prescribed 90 tablets of oxycodone 10 mg and did lab work and screening for HIV. the HIV was negative and hemoglobin was 6.3 with a WBC count of 14.9 and hematocrit of 17.8 with platelets of 561. reticulocyte count was 15.5%  READMISSION: 07/10/2016- The patient is here for readmission for bilateral lower extremity ulcers in the presence of [name] cell. The bimalleolar ulcers to the right lower extremity have been present for approximately 2 years, the left medial malleolus ulcer has been present approximately 6 months. She has followed with Dr.Thimmappa in the past and has had a total of 3 applications of Theraskin (01/2015, 09/2015, 06/17/16). She has also followed with Dr. Con Memos here in the clinic and has received 2 applications of TheraSkin (11/10/15, 11/30/15). The patient does experience chronic, and is not amenable to debridement. She had a [name] cell crisis in December 2017, prior to that has been several years. She is not currently on any antibiotic therapy and has not been treated with any recently. 07/17/2016 -- was seen by Dr. Iran Planas of plastic surgery who saw her 2 [name] postop application of Theraskin #3. She had removed her dressing and asked her to apply silver alginate on alternate days and follow-up back with the wound center. Future debridements and application of skin substitute would have to be done in the hospital due to her high risk for  anesthesia. READMISSION 04/17/2021 Patient is now a 51 year old woman that we have had in this clinic for a prolonged period of time and 2016-2017 and then again for 2 visits in February 2018. At that point she had wounds on the right lower leg predominantly medial. She had also been seen by plastic surgery Dr. Leland [name] who I believe took her to the OR for operative debridement and application of TheraSkin in 2017. After she left our clinic she was followed for a very prolonged period of time in the wound care center in [facility] who then referred her ultimately to [facility] where she was seen by Dr. Vernona Rieger. Again taken her to the OR for skin grafting which apparently did not take. She had multiple other attempts at dressings although I have not really looked over all of these notes in great detail. She has not been seen in a wound care center in about a year. She states over the last year in addition to her right lower leg she has developed wounds on the left lower leg quite extensive. She is using Xeroform to all of these wounds without really any improvement. She also has Medicaid which does not cover wound products. The patient has had vascular work-ups in the past including most recently on 03/28/2021 showing biphasic waveforms on the right triphasic at the PTA and biphasic at the dorsalis pedis on the left. She was unable to tolerate any degree of compression to do ABIs. Unfortunately TBI's were also not done. She had venous reflux studies done in 2017. This did not show any evidence of a DVT or SVT and no venous incompetence was noted in the right leg at the time this was the only side with the wound As noted I did not look all over her old records. She apparently had a course of HBO and [facility] although I am not sure what the indication would have been. In any case she developed seizures and terminated treatment earlier. She is generally much more disabled than when we last saw her in clinic.  She can no longer walk pretty much wheelchair-bound because predominantly of pain in the left hip. 04/24/2021; the patient tolerated the wraps we put on. We used Santyl and Hydrofera Blue under compression. I brought her back for a nurse visit for a change in dressing. With Medicaid we will have a hard time  getting anything paid for and hence the need for compression. She arrives in clinic with all the wounds looking somewhat better in terms of surface 12/20; circumferential wound on the right from the lateral to the medial. She has open areas on the left medial and left lateral x2 on all of this with the same surface. This does not look completely healthy although she does have some epithelialization. She is not complaining of a lot of pain which is unusual for her [name] ulcers. I have not looked over her extensive records from [facility]. She had recent arterial studies and has a history of venous reflux studies I will need to look these over although I do not believe she has significant arterial disease 2023 05/22/2021; patient's wound areas measure slightly smaller. Still a lot of drainage coming from the right we have been using Hydrofera Blue and Santyl with some improvement in the wound surfaces. 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 This was in November and showed biphasic and triphasic waveforms but she could not tolerate ABIs because of pressure and unfortunately TBI's were not done. She has not had recent venous reflux studies that I can see 1/10; not much change about the same surface area. This has a yellowish surface to it very gritty. We have been using Santyl and Hydrofera Blue for a prolonged period. Culture I did last week showed methicillin sensitive staph aureus "rare". Our intake nurse reports greenish drainage which may be the Hydrofera Blue  itself 1/17; wounds are continue to measure smaller although I am not sure about the accuracy here. Especially the areas on the right are covered in what looks to be a nonviable surface although she does have some epithelialization. Similarly she has areas on the left medial and left lateral ankle area which appear to have a better surface and perhaps are slightly smaller. We have been using Santyl and Hydrofera Blue. She cannot tolerate mechanical debridements She went for her reflux studies which showed significant reflux at the greater saphenous vein at the saphenofemoral junction as well as the greater saphenous vein in the proximal calf on the left she had reflux in the thigh and the common femoral vein and supra vein Fishel vein reflux in the greater saphenous vein. I will have vein and vascular look at this. My thoughts have been that these are likely [name] wounds. I looked through her old records from [facility] wound care center and then when she graduated to [facility] wound care center where she saw Dr. Zigmund Daniel and Dr. Vernona Rieger. Although I can see she had reflux studies done I do not see that she actually saw a vein and vascular. I went over the fact that she had operative debridements and actual skin grafting that did not take. I do not think these wounds have ever really progressed towards healing 1/31;Substantial wounds on the right ankle area. Hyper granulated very gritty adherent debris on the surface. 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 On the left she has no DVT or SVT deep vein reflux in the common femoral vein.  Superficial vein reflux in the greater saphenous vein on although the vein diameter is not really all that large. I do not think there is anything that can be done with these although I am going to send her for consultation to vein and vascular. 2/7; Wound exam; substantial wound area on the right posterior ankle area and areas on the left medial ankle and left lateral ankle. I was able to debride the left medial ankle last week fairly aggressively and it is back this week to a completely nonviable surface She will see vascular surgery this Friday and I would like them to review the venous studies and also any comments on her arterial status. If they do not see an issue here I am going to refer her to plastic surgery for an operative debridement perhaps intraoperative ACell or Integra. Eventually she will require a deep tissue culture again 2/14; substantial wound area on the right posterior ankle, medial ankle. We have been using silver alginate The patient was seen by vein and vascular she had both venous reflux studies and arterial studies. 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 She had no evidence of superficial venous thrombosis. She did have superficial vein reflux at the saphenofemoral junction and the greater saphenous vein. On the left no evidence of a DVT no evidence of superficial venous throat thrombosis she did have deep vein reflux in the common femoral vein and superficial vein reflux in the greater saphenous vein but these were not felt to be amenable to ablation. In terms of arterial studies she had triphasic and wife biphasic wave waveforms bilaterally not felt to have a significant arterial issue. I do not get the feeling that they felt that any part of her nonhealing wounds were related to either arterial or venous issues. They did note that she had venous reflux at the right at the [facility] and [facility]. And also  on the left there were reflux in the deep system at the common femoral vein and greater saphenous vein in the proximal thigh. Nothing amenable to ablation. 2/20; she is making some decent progress on the right where there is nice skin between the 2 open areas on the right ankle. The surfaces here do not look viable yet there is some surrounding epithelialization. She still has a small area on the left medial ankle area. Hyper-granulated Jody's away always 2/28 patient has an appointment with plastic surgery on 3/8. We will see her back on 3/9. She may have to call us to get the area redressed. We've been using Santyl under silver alginate. We made a nice improvement on the left medial ankle. The larger wounds on the right also looks somewhat better in terms of epithelialization although I think they could benefit from an aggressive debridement if plastic surgery would be willing to do that. Perhaps placement of Integra or a cell 07/26/2021: She saw Dr. Claudia Desanctis yesterday. He raised the question as to whether or not this might be pyoderma and wanted to wait until that question was answered by dermatology before proceeding with any sort of operative debridement. We have continue to use Santyl under silver alginate with Kerlix and Coban wraps. Overall, her wounds appear to be continuing to contract and epithelialize, with some granulation tissue present. There continues to be some slough  on all wound surfaces. 08/09/2021: She has not been able to get an appointment with dermatology because apparently the offices in [place] do not accept Medicaid. She is looking into whether or not she can be seen at the main [facility] dermatology clinic. This is necessary because plastic surgery is concerned that her wounds might represent pyoderma and they did not want to do any procedure until that was clarified. We have been using Santyl under silver alginate with Kerlix and Coban wraps. Today,  there was a greater amount of drainage on her dressings with a slight green discoloration and significant odor. Despite this, her wounds continue to contract and epithelialize. There is pale granulation tissue present and actually, on the left medial ankle, the granulation tissue is a bit hypertrophic. 08/16/2021: Last week, I took a culture and this grew back rare methicillin-resistant Staph aureus and rare corynebacterium. The MRSA was sensitive to gentamicin which we began applying topically on an empiric basis. This week, her wounds are a bit smaller and the drainage and odor are less. Her primary care provider is working on assisting the patient with a dermatology evaluation. She has been in silver alginate over the gentamicin that was started last week along with Kerlix and Coban wraps. 08/23/2021: Because she has Medicaid, we have been unable to get her into see any dermatologist in the [place] to rule out pyoderma gangrenosum, which was a requirement from plastic surgery prior to any sort of debridement and grafting. Despite this, however, all of her wounds continue to get smaller. The wound on her left medial ankle is nearly closed. There is no odor from the wounds, although she still accumulates a modest amount of drainage on her dressings. 08/30/2021: The lateral right ankle wound and the medial left ankle wound are a bit smaller today. The medial right ankle wound is about the same size. They are less tender. We have still been unable to get her into dermatology. 09/06/2021: All of the wounds are about the same size today. She continues to endorse minimal pain. I communicated with Dr. Claudia Desanctis in plastic surgery regarding our issues getting a dermatology appointment; he was out of town but indicated that he would look into perhaps performing the biopsy in his office and will have his office contact her. 09/14/2021: The patient has an appointment in dermatology, but it is not until October. Her wounds  are roughly the same; she continues to have very thick purulent-looking drainage on her dressings. 09/20/2021: The left medial wound is nearly closed and just has a bit of accumulated eschar on the surface. The right medial and lateral ankle wounds are perhaps a little bit smaller. They continue to have a very pale surface with accumulation of thin slough. 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 I did not think [employer] was indicated based on this. She is getting topical mupirocin with Prisma silver collagen. 10/04/2021: The patient was not seen in clinic last week due to childcare coverage issues. In the interim, the left medial leg wound has closed. The right sided leg wounds are smaller. There is more granulation tissue coming through, particularly on the lateral wound. The surface remains somewhat gritty. We have been applying topical mupirocin and Prisma silver collagen. 10/11/2021: The left medial leg wound remains closed. She does complain of some anesthetic sensation to the area. Both of the right-sided leg wounds are smaller but still have accumulated slough. 10/18/2021: Both right-sided leg wounds  are minimally smaller this week. She still continues to accumulate slough and has thick drainage on her dressings. 10/23/2021: Both wounds continue to contract. There is still slough buildup. She has been approved for a keratin-based skin substitute trial product but it will not be available until next week. 10/30/2021: The wounds are about the same to perhaps slightly smaller. There is still continued slough buildup. Unfortunately, the rep for the keratin based product did not show up today and did not answer his phone when called. 11/08/2021: The wounds are little bit smaller today. She continues to have thick drainage but the surfaces are relatively clean with just a little bit of slough accumulation. She reported to me today that she is unable to completely flex her left  ankle and on examination it seems this is potentially related to scar tissue from her wounds. We do have the ProgenaMatrix trial product available for her today. 11/15/2021: Both wounds are smaller today. There is some slough accumulation on the surfaces, but the medial wound, in particular looks like it is filling in and is less deep. She did hear from physical therapy and she is going to start working with them on July 11. She is here for her second application of the trial skin substitute, ProgenaMatrix. 11/22/2021: Both wounds continue to contract, the medial more dramatically than the lateral. Both wounds have a layer of slough on the surface, but underneath this, the gritty fibrous tissue has a little bit more of a pink cast to it rather than being as pale as it has been. 11/29/2021: The wounds are roughly the same size this week, perhaps a millimeter or 2 smaller. The medial wound has filled in and is nearly flush with the surrounding skin surface. She continues to have a lot of slough accumulation on both surfaces. 12/06/2021: No significant change to her wounds, but she has a new opening on her dorsal foot, just distal to the right lateral ankle wound. The area on her left medial ankle that reopened looks a little bit larger today. She has quite a bit of pain associated with the new wound. 12/12/2021: Her wounds look about the same but the new opening on her right lateral dorsal foot is a little bit bigger. She continues to have a fair amount of pain with this wound. 12/26/2021: The left medial ankle wound is tiny and superficial. She has 2 areas of crusting on her left lateral ankle, however, that appear to be threatening to open again. Her right medial ankle wound is a little bit smaller today but still continues to accumulate thick rubbery slough. The new dorsal foot wound is exquisitely painful but there is no odor or purulent drainage. No erythema or induration. The right lateral ankle wound  looks about the same today, again with thick rubbery slough. 01/03/2022: The left medial ankle wound has closed again. Both right ankle wounds appear to be about the same size with thick rubbery slough. The dorsal foot wound on the right continues to be quite painful and she stated that she did not want any debridement of that site today. 01/10/2022: No real change to any of her wounds. She continues to accumulate thick slough. The dorsal foot wound has merged with the lateral malleolar wound. She is experiencing significant pain in the dorsal foot portion of the ulcer. 01/16/2022: Absolutely no change or progress in her wounds. Patient History Information obtained from Patient. Family History Diabetes - Mother, Lung Disease - Mother, No family history of Cancer,  Heart Disease, Hereditary Spherocytosis, Hypertension, Kidney Disease, Seizures, Stroke, Thyroid Problems, Tuberculosis. Social History Never smoker, Marital Status - Married, Alcohol Use - Never, Drug Use - No History, Caffeine Use - Daily. Medical History Eyes Denies history of Cataracts, Glaucoma, Optic Neuritis Ear/Nose/Mouth/Throat Denies history of Chronic sinus problems/congestion, Middle ear problems Hematologic/Lymphatic Patient has history of Anemia, [name] Cell Disease Denies history of Hemophilia, Human Immunodeficiency Virus, Lymphedema Respiratory Denies history of Aspiration, Asthma, Chronic Obstructive Pulmonary Disease (COPD), Pneumothorax, Sleep Apnea, Tuberculosis Cardiovascular Denies history of Angina, Arrhythmia, Congestive Heart Failure, Coronary Artery Disease, Deep Vein Thrombosis, Hypertension, Hypotension, Myocardial Infarction, Peripheral Arterial Disease, Peripheral Venous Disease, Phlebitis, Vasculitis Gastrointestinal Denies history of Cirrhosis , Colitis, Crohnoos, Hepatitis A, Hepatitis B, Hepatitis C Endocrine Denies history of Type I Diabetes, Type II Diabetes Genitourinary Denies history of  End Stage Renal Disease Immunological Denies history of Lupus Erythematosus, Raynaudoos, Scleroderma Integumentary (Skin) Denies history of History of Burn Musculoskeletal Denies history of Gout, Rheumatoid Arthritis, Osteoarthritis, Osteomyelitis Neurologic Patient has history of Neuropathy - right foot intermittant Denies history of Dementia, Quadriplegia, Paraplegia, Seizure Disorder Oncologic Denies history of Received Chemotherapy, Received Radiation Psychiatric Denies history of Anorexia/bulimia, Confinement Anxiety Hospitalization/Surgery History - c section x2. - left breast lumpectomy. - iandD right ankle with theraskin. Medical A Surgical History Notes nd Constitutional Symptoms (General Health) H/O miscarriage Cardiovascular bradycardia Gastrointestinal cholilithiasis Objective Constitutional No acute distress.. Vitals Time Taken: 8:51 AM, Height: 67 in, Temperature: 98.3 F, Pulse: 94 bpm, Respiratory Rate: 18 breaths/min, Blood Pressure: 134/61 mmHg. Respiratory Normal work of breathing on room air.. General Notes: 01/16/2022: Absolutely no change or progress in her wounds. Integumentary (Hair, Skin) Wound #17 status is Open. Original cause of wound was Gradually Appeared. The date acquired was: 10/05/2012. The wound has been in treatment 39 [name]. The wound is located on the Right,Lateral Lower Leg. The wound measures 9.8cm length x 6.9cm width x 0.1cm depth; 53.109cm^2 area and 5.311cm^3 volume. There is Fat Layer (Subcutaneous Tissue) exposed. There is no tunneling or undermining noted. There is a large amount of serosanguineous drainage noted. The wound margin is distinct with the outline attached to the wound base. There is small (1-33%) pink, pale granulation within the wound bed. There is a large (67-100%) amount of necrotic tissue within the wound bed including Adherent Slough. Wound #21 status is Open. Original cause of wound was Gradually Appeared. The date  acquired was: 06/26/2021. The wound has been in treatment 29 [name]. The wound is located on the Right,Medial Ankle. The wound measures 7cm length x 2cm width x 0.1cm depth; 10.996cm^2 area and 1.1cm^3 volume. There is Fat Layer (Subcutaneous Tissue) exposed. There is no tunneling or undermining noted. There is a medium amount of serosanguineous drainage noted. The wound margin is distinct with the outline attached to the wound base. There is small (1-33%) pink granulation within the wound bed. There is a large (67-100%) amount of necrotic tissue within the wound bed including Adherent Slough. 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 There was a Selective/Open Wound Non-Viable Tissue Debridement with a total area of 67.62 sq cm performed by [name] Maudlin, MD. With the following instrument(s): Curette to remove Non-Viable tissue/material. Material removed includes [facility] after achieving pain control using Lidocaine 5% topical ointment. No specimens were taken. A time  out was conducted at 09:12, prior to the start of the procedure. A Minimum amount of bleeding was controlled with Pressure. The procedure was tolerated well. Post Debridement Measurements: 9.8cm length x 6.9cm width x 0.1cm depth; 5.311cm^3 volume. Character of Wound/Ulcer Post Debridement requires further debridement. Post procedure Diagnosis Wound #17: Same as Pre-Procedure Wound #21 Pre-procedure diagnosis of Wound #21 is a [name] Cell Lesion located on the Right,Medial Ankle .Severity of Tissue Pre Debridement is: Fat layer exposed. There was a Selective/Open Wound Non-Viable Tissue Debridement with a total area of 14 sq cm performed by [name] Maudlin, MD. With the following instrument(s):  Curette to remove Non-Viable tissue/material. Material removed includes [facility] after achieving pain control using Lidocaine 5% topical ointment. No specimens were taken. A time out was conducted at 09:12, prior to the start of the procedure. A Minimum amount of bleeding was controlled with Pressure. The procedure was tolerated well. Post Debridement Measurements: 7cm length x 2cm width x 0.1cm depth; 1.1cm^3 volume. Character of Wound/Ulcer Post Debridement requires further debridement. Severity of Tissue Post Debridement is: Fat layer exposed. Post procedure Diagnosis Wound #21: Same as Pre-Procedure Plan Follow-up Appointments: Return Appointment in 1 week. - Dr. Celine Ahr Rm 3 Thursday 01/24/22 at 9:30am Anesthetic: (In clinic) Topical Lidocaine 5% applied to wound bed (In clinic) Topical Lidocaine 4% applied to wound bed - In Clinic Bathing/ Shower/ Hygiene: May shower with protection but do not get wound dressing(s) wet. - Can get cast protector bags at [facility] or [facility] Edema Control - Lymphedema / SCD / Other: Elevate legs to the level of the heart or above for 30 minutes daily and/or when sitting, a frequency of: - throughout the day Avoid standing for long periods of time. Exercise regularly Additional Orders / Instructions: Follow Nutritious Diet WOUND #17: - Lower Leg Wound Laterality: Right, Lateral Cleanser: Soap and Water 1 x Per Week/30 Days Discharge Instructions: May shower and wash wound with dial antibacterial soap and water prior to dressing change. Cleanser: Wound Cleanser 1 x Per Week/30 Days Discharge Instructions: Cleanse the wound with wound cleanser prior to applying a clean dressing using gauze sponges, not tissue or cotton balls. Peri-Wound Care: Triamcinolone 15 (g) 1 x Per Week/30 Days Discharge Instructions: Use triamcinolone 15 (g) as directed Peri-Wound Care: Sween Lotion (Moisturizing lotion) 1 x Per Week/30 Days Discharge Instructions: Apply moisturizing lotion  as directed Topical: Mupirocin Ointment 1 x Per Week/30 Days Discharge Instructions: Apply Mupirocin (Bactroban) as instructed Prim Dressing: Promogran Prisma Matrix, 4.34 (sq in) (silver collagen) 1 x Per Week/30 Days ary Discharge Instructions: Moisten collagen with saline or hydrogel Secondary Dressing: ABD Pad, 5x9 1 x Per Week/30 Days Discharge Instructions: Apply over primary dressing as directed. Secondary Dressing: Zetuvit Plus 4x8 in 1 x Per Week/30 Days Discharge Instructions: Apply over primary dressing as directed. Com pression Wrap: Kerlix Roll 4.5x3.1 (in/yd) 1 x Per Week/30 Days Discharge Instructions: Apply Kerlix and Coban compression as directed. Com pression Wrap: Coban Self-Adherent Wrap 4x5 (in/yd) 1 x Per Week/30 Days Discharge Instructions: Apply over Kerlix as directed. WOUND #21: - Ankle Wound Laterality: Right, Medial Cleanser: Soap and Water 1 x Per Week/30 Days Discharge Instructions: May shower and wash wound with dial antibacterial soap and water prior to dressing change. Cleanser: Wound Cleanser 1 x Per Week/30 Days Discharge Instructions: Cleanse the wound with wound cleanser prior to applying a clean dressing using gauze sponges, not tissue or cotton balls. Peri-Wound Care: Triamcinolone 15 (g) 1 x Per Week/30 Days Discharge  Instructions: Use triamcinolone 15 (g) as directed Peri-Wound Care: Sween Lotion (Moisturizing lotion) 1 x Per Week/30 Days Discharge Instructions: Apply moisturizing lotion as directed Topical: Mupirocin Ointment 1 x Per Week/30 Days Discharge Instructions: Apply Mupirocin (Bactroban) as instructed Prim Dressing: Promogran Prisma Matrix, 4.34 (sq in) (silver collagen) 1 x Per Week/30 Days ary Discharge Instructions: Moisten collagen with saline or hydrogel Secondary Dressing: ABD Pad, 5x9 1 x Per Week/30 Days Discharge Instructions: Apply over primary dressing as directed. Secondary Dressing: Zetuvit Plus 4x8 in 1 x Per Week/30  Days Discharge Instructions: Apply over primary dressing as directed. Com pression Wrap: Kerlix Roll 4.5x3.1 (in/yd) 1 x Per Week/30 Days Discharge Instructions: Apply Kerlix and Coban compression as directed. Compression Wrap: Coban Self-Adherent Wrap 4x5 (in/yd) 1 x Per Week/30 Days Discharge Instructions: Apply over Kerlix as directed. 01/16/2022: Absolutely no change or progress in her wounds. I used a curette to debride slough and eschar off of the areas that she would permit, but debridement was limited by pain. She really would benefit from surgical debridement, but I am unable to find any surgeon willing to undertake this. Looking back through her chart, it appears that she did have surgical debridement about 5 years ago. When this was followed with TheraSkin application, she did have some improvement in her wounds. I will continue to pursue other options but in the meantime we will continue topical mupirocin with Prisma silver collagen, Kerlix and Coban wrapping. Follow-up in 1 week. Electronic Signature(s) Signed: 01/16/2022 9:37:16 AM By: [name] Maudlin MD FACS Entered By: [name] on 01/16/2022 09:37:16 -------------------------------------------------------------------------------- HxROS Details Patient Name: Date of Service: [name] [name] 01/16/2022 8:45 A M Medical Record Number: [id number] Patient Account Number: [id number] Date of Birth/Sex: Treating RN: [birth year] (51 y.o. [name] Primary Care Provider: Cammie [name] Other Clinician: Referring Provider: Treating Provider/Extender: [name] in Treatment: 39 Information Obtained From Patient Constitutional Symptoms (General Health) Medical History: Past Medical History Notes: H/O miscarriage Eyes Medical History: Negative for: Cataracts; Glaucoma; Optic Neuritis Ear/Nose/Mouth/Throat Medical History: Negative for: Chronic sinus problems/congestion; Middle ear  problems Hematologic/Lymphatic Medical History: Positive for: Anemia; [name] Cell Disease Negative for: Hemophilia; Human Immunodeficiency Virus; Lymphedema Respiratory Medical History: Negative for: Aspiration; Asthma; Chronic Obstructive Pulmonary Disease (COPD); Pneumothorax; Sleep Apnea; Tuberculosis Cardiovascular Medical History: Negative for: Angina; Arrhythmia; Congestive Heart Failure; Coronary Artery Disease; Deep Vein Thrombosis; Hypertension; Hypotension; Myocardial Infarction; Peripheral Arterial Disease; Peripheral Venous Disease; Phlebitis; Vasculitis Past Medical History Notes: bradycardia Gastrointestinal Medical History: Negative for: Cirrhosis ; Colitis; Crohns; Hepatitis A; Hepatitis B; Hepatitis C Past Medical History Notes: cholilithiasis Endocrine Medical History: Negative for: Type I Diabetes; Type II Diabetes Genitourinary Medical History: Negative for: End Stage Renal Disease Immunological Medical History: Negative for: Lupus Erythematosus; Raynauds; Scleroderma Integumentary (Skin) Medical History: Negative for: History of Burn Musculoskeletal Medical History: Negative for: Gout; Rheumatoid Arthritis; Osteoarthritis; Osteomyelitis Neurologic Medical History: Positive for: Neuropathy - right foot intermittant Negative for: Dementia; Quadriplegia; Paraplegia; Seizure Disorder Oncologic Medical History: Negative for: Received Chemotherapy; Received Radiation Psychiatric Medical History: Negative for: Anorexia/bulimia; Confinement Anxiety Immunizations Pneumococcal Vaccine: Received Pneumococcal Vaccination: No Implantable Devices None Hospitalization / Surgery History Type of Hospitalization/Surgery c section x2 left breast lumpectomy iandD right ankle with theraskin Family and Social History Cancer: No; Diabetes: Yes - Mother; Heart Disease: No; Hereditary Spherocytosis: No; Hypertension: No; Kidney Disease: No; Lung Disease: Yes -  Mother; Seizures: No; Stroke: No; Thyroid Problems: No; Tuberculosis: No; Never smoker; Marital Status - Married;  Alcohol Use: Never; Drug Use: No History; Caffeine Use: Daily; Financial Concerns: No; Food, Clothing or Shelter Needs: No; Support System Lacking: No; Transportation Concerns: No Electronic Signature(s) Signed: 01/16/2022 10:05:10 AM By: [name] Maudlin MD FACS Signed: 01/16/2022 5:02:02 PM By: [name] East RN Entered By: [name] on 01/16/2022 09:34:55 -------------------------------------------------------------------------------- SuperBill Details Patient Name: Date of Service: [name] [name] 01/16/2022 Medical Record Number: [id number] Patient Account Number: [id number] Date of Birth/Sex: Treating RN: Apr 24, 1971 (51 y.o. 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 Facility Procedures CPT4 Code: [id number] Description: [phone] - DEBRIDE WOUND 1ST 20 SQ CM OR < ICD-10 Diagnosis Description L97.818 Non-pressure chronic ulcer of other part of right lower leg with other specified s Modifier: everity Quantity: 1 CPT4 Code: [id number] Description: 42353 - DEBRIDE WOUND EA ADDL 20 SQ CM ICD-10 Diagnosis Description L97.818 Non-pressure chronic ulcer of other part of right lower leg with other specified s Modifier: everity Quantity: 4 Physician Procedures : CPT4 Code Description Modifier [id number] - WC PHYS LEVEL 4 - EST PT 25 ICD-10 Diagnosis Description L97.818 Non-pressure chronic ulcer of other part of right lower leg with other specified severity D57.1 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

## 2022-01-24 ENCOUNTER — Encounter (HOSPITAL_BASED_OUTPATIENT_CLINIC_OR_DEPARTMENT_OTHER): Payer: Medicaid Other | Attending: General Surgery | Admitting: General Surgery

## 2022-01-24 DIAGNOSIS — D571 Sickle-cell disease without crisis: Secondary | ICD-10-CM | POA: Insufficient documentation

## 2022-01-24 DIAGNOSIS — L97818 Non-pressure chronic ulcer of other part of right lower leg with other specified severity: Secondary | ICD-10-CM | POA: Diagnosis present

## 2022-01-24 DIAGNOSIS — Z86718 Personal history of other venous thrombosis and embolism: Secondary | ICD-10-CM | POA: Insufficient documentation

## 2022-01-24 NOTE — Progress Notes (Signed)
[name], TUCH ([id number]) Visit Report for 01/24/2022 Arrival Information Details Patient Name: Date of Service: [name] [name] 01/24/2022 9:30 A M Medical Record Number: [id number] Patient Account Number: [id number] Date of Birth/Sex: Treating RN: 08-04-1970 (51 y.o. [name] Primary Care Macyn Shropshire: [name] Other Clinician: Referring Kandiss Ihrig: Treating Saylor Murry/Extender: Elyse Jarvis Weeks in Treatment: 5 Visit Information History Since Last Visit Added or deleted any medications: No Patient Arrived: [name] Any new allergies or adverse reactions: No Arrival Time: 09:32 Had a fall or experienced change in No Accompanied By: self activities of daily living that may affect Transfer Assistance: None risk of falls: Patient Identification Verified: Yes Signs or symptoms of abuse/neglect since last visito No Patient Requires Transmission-Based Precautions: No Hospitalized since last visit: No Patient Has Alerts: No Implantable device outside of the clinic excluding No cellular tissue based products placed in the center since last visit: Has Dressing in Place as Prescribed: Yes Has Compression in Place as Prescribed: Yes Pain Present Now: No Electronic Signature(s) Signed: 01/24/2022 5:06:31 PM By: Dellie Catholic RN Entered By: Dellie Catholic on 01/24/2022 09:33:16 -------------------------------------------------------------------------------- Encounter Discharge Information Details Patient Name: Date of Service: [name], [name] 01/24/2022 9:30 A M Medical Record Number: [id number] Patient Account Number: [id number] Date of Birth/Sex: Treating RN: [birth year] (51 y.o. [name] Primary Care Jaedyn Lard: [name] Other Clinician: Referring Cortavius Montesinos: Treating Markeita Alicia/Extender: Elyse Jarvis Weeks in Treatment: 31 Encounter Discharge Information Items Post Procedure Vitals Discharge Condition:  Stable Temperature (F): 98.3 Ambulatory Status: Cane Pulse (bpm): 79 Discharge Destination: Home Respiratory Rate (breaths/min): 16 Transportation: Private Auto Blood Pressure (mmHg): 136/71 Accompanied By: self Schedule Follow-up Appointment: Yes Clinical Summary of Care: Patient Declined Electronic Signature(s) Signed: 01/24/2022 5:06:31 PM By: Dellie Catholic RN Entered By: Dellie Catholic on 01/24/2022 17:05:19 -------------------------------------------------------------------------------- Lower Extremity Assessment Details Patient Name: Date of Service: [name], Arthur [name] 01/24/2022 9:30 A M Medical Record Number: [id number] Patient Account Number: [id number] Date of Birth/Sex: Treating RN: 1970-10-10 (51 y.o. [name] Primary Care Brysun Eschmann: [name] Other Clinician: Referring Roizy Harold: Treating Dea Bitting/Extender: Elyse Jarvis Weeks in Treatment: 40 Edema Assessment Assessed: [Left: No] [Right: No] Edema: [Left: Ye] [Right: s] Calf Left: Right: Point of Measurement: 33 cm From Medial Instep 28.3 cm 28.9 cm Ankle Left: Right: Point of Measurement: 10 cm From Medial Instep 19.8 cm 20.5 cm Electronic Signature(s) Signed: 01/24/2022 5:06:31 PM By: Dellie Catholic RN Entered By: Dellie Catholic on 01/24/2022 09:44:37 -------------------------------------------------------------------------------- Multi Wound Chart Details Patient Name: Date of Service: [name], [name] 01/24/2022 9:30 A M Medical Record Number: [id number] Patient Account Number: [id number] Date of Birth/Sex: Treating RN: 12-15-1970 (51 y.o. F) Primary Care Antanasia Kaczynski: [name] Other Clinician: Referring Ocean Kearley: Treating Yarrow Linhart/Extender: Elyse Jarvis Weeks in Treatment: 40 Vital Signs Height(in): 67 Pulse(bpm): 3 Weight(lbs): Blood Pressure(mmHg): 136/71 Body Mass Index(BMI): Temperature(F): 98.3 Respiratory Rate(breaths/min):  16 Photos: [N/A:N/A] Right, Lateral Lower Leg Right, Medial Ankle N/A Wound Location: Gradually Appeared Gradually Appeared N/A Wounding Event: Sickle Cell Lesion Sickle Cell Lesion N/A Primary Etiology: N/A Venous Leg Ulcer N/A Secondary Etiology: Anemia, Sickle Cell Disease, Anemia, Sickle Cell Disease, N/A Comorbid History: Neuropathy Neuropathy 10/05/2012 06/26/2021 N/A Date Acquired: 40 30 N/A Weeks of Treatment: Open Open N/A Wound Status: No No N/A Wound Recurrence: Yes No N/A Clustered Wound: 9.5x5.8x0.1 6x2.3x0.1 N/A Measurements L x W x D (cm) 43.275 10.838 N/A A (cm) : rea 4.328 1.084 N/A Volume (cm) :  77.00% 62.60% N/A % Reduction in Area: 77.00% 62.60% N/A % Reduction in Volume: Full Thickness Without Exposed Full Thickness Without Exposed N/A Classification: Support Structures Support Structures Large Medium N/A Exudate A mount: Serosanguineous Serosanguineous N/A Exudate Type: red, brown red, brown N/A Exudate Color: Distinct, outline attached Distinct, outline attached N/A Wound Margin: Small (1-33%) Medium (34-66%) N/A Granulation A mount: Pink, Pale Pink N/A Granulation Quality: Large (67-100%) Medium (34-66%) N/A Necrotic A mount: Fat Layer (Subcutaneous Tissue): Yes Fat Layer (Subcutaneous Tissue): Yes N/A Exposed Structures: Fascia: No Fascia: No Tendon: No Tendon: No Muscle: No Muscle: No Joint: No Joint: No Bone: No Bone: No Medium (34-66%) Medium (34-66%) N/A Epithelialization: Debridement - Selective/Open Wound Debridement - Selective/Open Wound N/A Debridement: Pre-procedure Verification/Time Out 09:55 09:55 N/A Taken: Lidocaine 4% Topical Solution Lidocaine 4% Topical Solution N/A Pain Control: [facility] N/A Tissue Debrided: Non-Viable Tissue Non-Viable Tissue N/A Level: 55.1 13.8 N/A Debridement A (sq cm): rea Curette Curette N/A Instrument: Minimum Minimum N/A Bleeding: Pressure Pressure N/A Hemostasis  A chieved: 0 0 N/A Procedural Pain: 0 0 N/A Post Procedural Pain: Procedure was tolerated well Procedure was tolerated well N/A Debridement Treatment Response: 9.5x5.8x0.1 6x2.3x0.1 N/A Post Debridement Measurements L x W x D (cm) 4.328 1.084 N/A Post Debridement Volume: (cm) Debridement Debridement N/A Procedures Performed: Treatment Notes Electronic Signature(s) Signed: 01/24/2022 10:01:21 AM By: Fredirick Maudlin MD FACS Entered By: Fredirick Maudlin on 01/24/2022 10:01:21 -------------------------------------------------------------------------------- Multi-Disciplinary Care Plan Details Patient Name: Date of Service: [name] [name], [name] 01/24/2022 9:30 A M Medical Record Number: [id number] Patient Account Number: [id number] Date of Birth/Sex: Treating RN: November 23, 1970 (51 y.o. [name] Primary Care Gotti Alwin: [name] Other Clinician: Referring Mechell Girgis: Treating Omeed Osuna/Extender: Elyse Jarvis Weeks in Treatment: 36 Multidisciplinary Care Plan reviewed with physician Active Inactive Venous Leg Ulcer Nursing Diagnoses: Actual venous Insuffiency (use after diagnosis is confirmed) Knowledge deficit related to disease process and management Goals: Patient will maintain optimal edema control Date Initiated: 06/26/2021 Target Resolution Date: 02/15/2022 Goal Status: Active Interventions: Assess peripheral edema status every visit. Compression as ordered Treatment Activities: Therapeutic compression applied : 06/26/2021 Notes: Wound/Skin Impairment Nursing Diagnoses: Impaired tissue integrity Goals: Patient/caregiver will verbalize understanding of skin care regimen Date Initiated: 04/17/2021 Target Resolution Date: 02/15/2022 Goal Status: Active Ulcer/skin breakdown will have a volume reduction of 30% by week 4 Date Initiated: 04/17/2021 Date Inactivated: 05/29/2021 Target Resolution Date: 05/15/2021 Goal Status: Met Ulcer/skin  breakdown will have a volume reduction of 50% by week 8 Date Initiated: 05/29/2021 Date Inactivated: 06/26/2021 Target Resolution Date: 06/26/2021 Goal Status: Unmet Unmet Reason: venous reflux Interventions: Assess patient/caregiver ability to obtain necessary supplies Assess patient/caregiver ability to perform ulcer/skin care regimen upon admission and as needed Assess ulceration(s) every visit Provide education on ulcer and skin care Treatment Activities: Topical wound management initiated : 04/17/2021 Notes: 06/08/21: Left leg wounds greater than 30% volume reduction, right leg acute infection. Electronic Signature(s) Signed: 01/24/2022 5:06:31 PM By: Dellie Catholic RN Entered By: Dellie Catholic on 01/24/2022 17:04:09 -------------------------------------------------------------------------------- Pain Assessment Details Patient Name: Date of Service: [name], [name] [name] 01/24/2022 9:30 A M Medical Record Number: [id number] Patient Account Number: [id number] Date of Birth/Sex: Treating RN: Oct 02, 1970 (51 y.o. [name] Primary Care Tyana Butzer: [name] Other Clinician: Referring Shekita Boyden: Treating Artem Bunte/Extender: Elyse Jarvis Weeks in Treatment: 40 Active Problems Location of Pain Severity and Description of Pain Patient Has Paino Yes Site Locations Pain Location: Pain Location: Generalized Pain With Dressing Change: Yes  Duration of the Pain. Constant / Intermittento Constant Rate the pain. Current Pain Level: 5 Worst Pain Level: 10 Least Pain Level: 5 Tolerable Pain Level: 5 Character of Pain Describe the Pain: Difficult to Pinpoint Pain Management and Medication Current Pain Management: Medication: Yes Cold Application: No Rest: Yes Massage: No Activity: No T.E.N.S.: No Heat Application: No Leg drop or elevation: No Is the Current Pain Management Adequate: Inadequate How does your wound impact your activities of daily  livingo Sleep: No Bathing: No Appetite: No Relationship With Others: No Bladder Continence: No Emotions: No Bowel Continence: No Work: No Toileting: No Drive: No Dressing: No Hobbies: No Electronic Signature(s) Signed: 01/24/2022 5:06:31 PM By: Dellie Catholic RN Entered By: Dellie Catholic on 01/24/2022 09:36:05 -------------------------------------------------------------------------------- Patient/Caregiver Education Details Patient Name: Date of Service: [name], [name] 9/7/2023andnbsp9:30 A M Medical Record Number: [id number] Patient Account Number: [id number] Date of Birth/Gender: Treating RN: Mar 18, 1971 (51 y.o. [name] Primary Care Physician: [name] Other Clinician: Referring Physician: Treating Physician/Extender: Elyse Jarvis Weeks in Treatment: 50 Education Assessment Education Provided To: Patient Education Topics Provided Wound/Skin Impairment: Methods: Explain/Verbal Responses: Return demonstration correctly Electronic Signature(s) Signed: 01/24/2022 5:06:31 PM By: Dellie Catholic RN Entered By: Dellie Catholic on 01/24/2022 17:04:23 -------------------------------------------------------------------------------- Wound Assessment Details Patient Name: Date of Service: [name], [name] 01/24/2022 9:30 A M Medical Record Number: [id number] Patient Account Number: [id number] Date of Birth/Sex: Treating RN: [birth year] (51 y.o. [name] Primary Care Jacarius Handel: [name] Other Clinician: Referring Roshunda Keir: Treating Cosme Jacob/Extender: Elyse Jarvis Weeks in Treatment: 40 Wound Status Wound Number: 17 Primary Etiology: Sickle Cell Lesion Wound Location: Right, Lateral Lower Leg Wound Status: Open Wounding Event: Gradually Appeared Comorbid History: Anemia, Sickle Cell Disease, Neuropathy Date Acquired: 10/05/2012 Weeks Of Treatment: 40 Clustered Wound: Yes Photos Wound  Measurements Length: (cm) 9.5 Width: (cm) 5.8 Depth: (cm) 0.1 Area: (cm) 43.275 Volume: (cm) 4.328 % Reduction in Area: 77% % Reduction in Volume: 77% Epithelialization: Medium (34-66%) Tunneling: No Undermining: No Wound Description Classification: Full Thickness Without Exposed Support Structures Wound Margin: Distinct, outline attached Exudate Amount: Large Exudate Type: Serosanguineous Exudate Color: red, brown Foul Odor After Cleansing: No Slough/Fibrino Yes Wound Bed Granulation Amount: Small (1-33%) Exposed Structure Granulation Quality: Pink, Pale Fascia Exposed: No Necrotic Amount: Large (67-100%) Fat Layer (Subcutaneous Tissue) Exposed: Yes Necrotic Quality: Adherent Slough Tendon Exposed: No Muscle Exposed: No Joint Exposed: No Bone Exposed: No Treatment Notes Wound #17 (Lower Leg) Wound Laterality: Right, Lateral Cleanser Soap and Water Discharge Instruction: May shower and wash wound with dial antibacterial soap and water prior to dressing change. Wound Cleanser Discharge Instruction: Cleanse the wound with wound cleanser prior to applying a clean dressing using gauze sponges, not tissue or cotton balls. Peri-Wound Care Triamcinolone 15 (g) Discharge Instruction: Use triamcinolone 15 (g) as directed Sween Lotion (Moisturizing lotion) Discharge Instruction: Apply moisturizing lotion as directed Topical Primary Dressing Secondary Dressing ABD Pad, 5x9 Discharge Instruction: Apply over primary dressing as directed. Zetuvit Plus 4x8 in Discharge Instruction: Apply over primary dressing as directed. Secured With Elastic Bandage 4 inch (ACE bandage) Discharge Instruction: Secure with ACE bandage as directed. Compression Wrap Kerlix Roll 4.5x3.1 (in/yd) Discharge Instruction: Apply Kerlix and Coban compression as directed. Compression Stockings Add-Ons Electronic Signature(s) Signed: 01/24/2022 5:06:31 PM By: Dellie Catholic RN Entered By: Dellie Catholic on 01/24/2022 09:48:44 -------------------------------------------------------------------------------- Wound Assessment Details Patient Name: Date of Service: [name] [name], [name] 01/24/2022 9:30 A M Medical Record Number:  [id number] Patient Account Number: [id number] Date of Birth/Sex: Treating RN: 04-Jul-1970 (51 y.o. [name] Primary Care Maimuna Leaman: [name] Other Clinician: Referring Ladasia Sircy: Treating Carleen Rhue/Extender: Elyse Jarvis Weeks in Treatment: 40 Wound Status Wound Number: 21 Primary Etiology: Sickle Cell Lesion Wound Location: Right, Medial Ankle Secondary Etiology: Venous Leg Ulcer Wounding Event: Gradually Appeared Wound Status: Open Date Acquired: 06/26/2021 Comorbid History: Anemia, Sickle Cell Disease, Neuropathy Weeks Of Treatment: 30 Clustered Wound: No Photos Wound Measurements Length: (cm) 6 Width: (cm) 2.3 Depth: (cm) 0.1 Area: (cm) 10.838 Volume: (cm) 1.084 % Reduction in Area: 62.6% % Reduction in Volume: 62.6% Epithelialization: Medium (34-66%) Tunneling: No Undermining: No Wound Description Classification: Full Thickness Without Exposed Support Structures Wound Margin: Distinct, outline attached Exudate Amount: Medium Exudate Type: Serosanguineous Exudate Color: red, brown Foul Odor After Cleansing: No Slough/Fibrino Yes Wound Bed Granulation Amount: Medium (34-66%) Exposed Structure Granulation Quality: Pink Fascia Exposed: No Necrotic Amount: Medium (34-66%) Fat Layer (Subcutaneous Tissue) Exposed: Yes Necrotic Quality: Adherent Slough Tendon Exposed: No Muscle Exposed: No Joint Exposed: No Bone Exposed: No Treatment Notes Wound #21 (Ankle) Wound Laterality: Right, Medial Cleanser Soap and Water Discharge Instruction: May shower and wash wound with dial antibacterial soap and water prior to dressing change. Wound Cleanser Discharge Instruction: Cleanse the wound with wound cleanser  prior to applying a clean dressing using gauze sponges, not tissue or cotton balls. Peri-Wound Care Triamcinolone 15 (g) Discharge Instruction: Use triamcinolone 15 (g) as directed Sween Lotion (Moisturizing lotion) Discharge Instruction: Apply moisturizing lotion as directed Topical Primary Dressing Secondary Dressing ABD Pad, 5x9 Discharge Instruction: Apply over primary dressing as directed. Zetuvit Plus 4x8 in Discharge Instruction: Apply over primary dressing as directed. Secured With Elastic Bandage 4 inch (ACE bandage) Discharge Instruction: Secure with ACE bandage as directed. Compression Wrap Kerlix Roll 4.5x3.1 (in/yd) Discharge Instruction: Apply Kerlix and Coban compression as directed. Compression Stockings Add-Ons Electronic Signature(s) Signed: 01/24/2022 5:06:31 PM By: Dellie Catholic RN Entered By: Dellie Catholic on 01/24/2022 09:48:19 -------------------------------------------------------------------------------- Vitals Details Patient Name: Date of Service: [name] [name], [name] 01/24/2022 9:30 A M Medical Record Number: [id number] Patient Account Number: [id number] Date of Birth/Sex: Treating RN: 08-26-1970 (51 y.o. [name] Primary Care Victory Dresden: Other Clinician: [name] Referring Graclyn Lawther: Treating Kadar Chance/Extender: Elyse Jarvis Weeks in Treatment: 40 Vital Signs Time Taken: 09:33 Temperature (F): 98.3 Height (in): 67 Pulse (bpm): 79 Respiratory Rate (breaths/min): 16 Blood Pressure (mmHg): 136/71 Reference Range: 80 - 120 mg / dl Electronic Signature(s) Signed: 01/24/2022 5:06:31 PM By: Dellie Catholic RN Entered By: Dellie Catholic on 01/24/2022 09:35:11

## 2022-01-25 NOTE — Progress Notes (Signed)
DELTA, PICHON ([id number]) Visit Report for 01/24/2022 Chief Complaint Document Details Patient Name: Date of Service: [name] 01/24/2022 9:30 A M Medical Record Number: [id number] Patient Account Number: [id number] Date of Birth/Sex: Treating RN: Aug 09, 1970 (51 y.o. F) Primary Care Provider: [name] Other Clinician: Referring Provider: Treating Provider/Extender: Elyse Jarvis Weeks in Treatment: 32 Information Obtained from: Patient Chief Complaint the patient is here for evaluation of her bilateral lower extremity sickle cell ulcers 04/17/2021; patient comes in for substantial wounds on the right and left lower leg Electronic Signature(s) Signed: 01/24/2022 10:01:28 AM By: Fredirick Maudlin MD FACS Entered By: Fredirick Maudlin on 01/24/2022 10:01:27 -------------------------------------------------------------------------------- Debridement Details Patient Name: Date of Service: [name], [name] 01/24/2022 9:30 A M Medical Record Number: [id number] Patient Account Number: [id number] Date of Birth/Sex: Treating RN: June 03, 1970 (51 y.o. [name] Primary Care Provider: [name] Other Clinician: Referring Provider: Treating Provider/Extender: Elyse Jarvis Weeks in Treatment: 40 Debridement Performed for Assessment: Wound #21 Right,Medial Ankle Performed By: Physician Fredirick Maudlin, MD Debridement Type: Debridement Severity of Tissue Pre Debridement: Fat layer exposed Level of Consciousness (Pre-procedure): Awake and Alert Pre-procedure Verification/Time Out Yes - 09:55 Taken: Start Time: 09:55 Pain Control: Lidocaine 4% T opical Solution T Area Debrided (L x W): otal 6 (cm) x 2.3 (cm) = 13.8 (cm) Tissue and other material debrided: Non-Viable, Slough, Slough Level: Non-Viable Tissue Debridement Description: Selective/Open Wound Instrument: Curette Bleeding: Minimum Hemostasis Achieved: Pressure End  Time: 09:57 Procedural Pain: 0 Post Procedural Pain: 0 Response to Treatment: Procedure was tolerated well Level of Consciousness (Post- Awake and Alert procedure): Post Debridement Measurements of Total Wound Length: (cm) 6 Width: (cm) 2.3 Depth: (cm) 0.1 Volume: (cm) 1.084 Character of Wound/Ulcer Post Debridement: Improved Severity of Tissue Post Debridement: Fat layer exposed Post Procedure Diagnosis Same as Pre-procedure Electronic Signature(s) Signed: 01/24/2022 10:48:39 AM By: Fredirick Maudlin MD FACS Signed: 01/24/2022 5:06:31 PM By: Dellie Catholic RN Entered By: Dellie Catholic on 01/24/2022 09:59:24 -------------------------------------------------------------------------------- Debridement Details Patient Name: Date of Service: [name], [name] 01/24/2022 9:30 A M Medical Record Number: [id number] Patient Account Number: [id number] Date of Birth/Sex: Treating RN: April 04, 1971 (51 y.o. [name] Primary Care Provider: [name] Other Clinician: Referring Provider: Treating Provider/Extender: Elyse Jarvis Weeks in Treatment: 40 Debridement Performed for Assessment: Wound #17 Right,Lateral Lower Leg Performed By: Physician Fredirick Maudlin, MD Debridement Type: Debridement Level of Consciousness (Pre-procedure): Awake and Alert Pre-procedure Verification/Time Out Yes - 09:55 Taken: Start Time: 09:55 Pain Control: Lidocaine 4% T opical Solution T Area Debrided (L x W): otal 9.5 (cm) x 5.8 (cm) = 55.1 (cm) Tissue and other material debrided: Non-Viable, Slough, Slough Level: Non-Viable Tissue Debridement Description: Selective/Open Wound Instrument: Curette Bleeding: Minimum Hemostasis Achieved: Pressure End Time: 09:57 Procedural Pain: 0 Post Procedural Pain: 0 Response to Treatment: Procedure was tolerated well Level of Consciousness (Post- Awake and Alert procedure): Post Debridement Measurements of Total Wound Length:  (cm) 9.5 Width: (cm) 5.8 Depth: (cm) 0.1 Volume: (cm) 4.328 Character of Wound/Ulcer Post Debridement: Improved Post Procedure Diagnosis Same as Pre-procedure Notes scribed for Dr Celine Ahr by J.[name] RN Electronic Signature(s) Signed: 01/24/2022 5:06:31 PM By: Dellie Catholic RN Signed: 01/25/2022 7:36:59 AM By: Fredirick Maudlin MD FACS Previous Signature: 01/24/2022 10:48:39 AM Version By: Fredirick Maudlin MD FACS Entered By: Dellie Catholic on 01/24/2022 17:03:58 -------------------------------------------------------------------------------- HPI Details Patient Name: Date of Service: [name], [name] 01/24/2022 9:30 A M Medical Record Number: [id number]  Patient Account Number: [id number] Date of Birth/Sex: Treating RN: May 01, 1971 (51 y.o. F) Primary Care Provider: [name] Other Clinician: Referring Provider: Treating Provider/Extender: Elyse Jarvis Weeks in Treatment: 40 History of Present Illness Location: medial and lateral ankle region on the right and left medial malleolus Quality: Patient reports experiencing a shooting pain to affected area(s). Severity: Patient states wound(s) are getting worse. Duration: right lower extremity bimalleolar ulcers have been present for approximately 2 years; the rright meedial malleolus ulcer has been there proximally 6 months Timing: Pain in wound is constant (hurts all the time) Context: The wound would happen gradually ssociated Signs and Symptoms: Patient reports having increase discharge. A HPI Description: 51 year old patient with a history of sickle cell anemia who was last seen by me with ulceration of the right lower extremity above the ankle and was referred to Dr. Leland Johns for a surgical debridement as I was unable to do anything in the office due to excruciating pain. At that stage she was referred from the plastic surgery service to dermatology who treated her for a skin infection with doxycycline and  then Levaquin and a local antibiotic ointment. I understand the patient has since developed ulceration on the left ankle both medial and lateral and was now referred back to the wound center as dermatology has finished the management. I do not have any notes from the dermatology department Old notes: 51 year old patient with a history of sickle cell anemia, pain bilateral lower extremities, right lower extremity ulcer and has a history of receiving a skin graft( Theraskin) several months ago. She has been visiting the wound center [facility] and was seen by Dr. Dellia Nims and Dr. Leland Johns. after prolonged conservator therapy between July 2016 and January 2017. She had been seen by the plastic surgeon and taken to the OR for debridement and application of Theraskin. She had 3 applications of Theraskin and was then treated with collagen. Prior to that she had a history of similar problems in 2014 and was treated conservatively. Had a reflux study done for the right lower extremity in August 2016 without reflux or DVT . Past medical history significant for sickle cell disease, anemia, leg ulcers, cholelithiasis,and has never been a smoker. Once the patient was discharged on the wound center she says within 2 or 3 weeks the problems recurred and she has been treating it conservatively. since I saw her 3 weeks ago at [facility] she has been unable to get her dressing material but has completed a course of doxycycline. 6/7/ 2017 -- lower extremity venous duplex reflux evaluation was done No evidence of SVT or DVT in the RLL. No venous incompetence in the RLL. No further vascular workup is indicated at this time. She was seen by Dr. [name], on 10/04/2015. She agreed with the plan of taking her to the OR for debridement and application of theraskin and would also take biopsies to rule out pyoderma gangrenosum. Follow-up note dated May 31 received and she was status post application of Theraskin to  multiple ulcers around the right ankle. Pathology did not show evidence of malignancy or pyoderma gangrenosum. She would continue to see as in the wound clinic for further care and see Dr. Leland Johns as needed. The patient brought the biopsy report and it was consistent with stasis ulcer no evidence of malignancy and the comment was that there was some adjacent neovascularization, fibrosis and patchy perivascular chronic inflammation. 11/15/2015 -- today we applied her first application of Theraskin 11/30/15; TheraSkin #2  12/13/2015 -- she is having a lot of pain locally and is here for possible application of a theraskin today. 01/16/2016 -- the patient has significant pain and has noticed despite in spite of all local care and oral pain medication. It is impossible to debride her in the office. 02/06/2016 -- I do not see any notes from Dr. Iran Planas( the patient has not made a call to the office know as she heard from them) and the only visit to recently was with her PCP Dr. Danella Penton -- I saw her on 01/16/2016 and prescribed 90 tablets of oxycodone 10 mg and did lab work and screening for HIV. the HIV was negative and hemoglobin was 6.3 with a WBC count of 14.9 and hematocrit of 17.8 with platelets of 561. 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 The bimalleolar ulcers to the right lower extremity have been present for approximately 2 years, the left medial malleolus ulcer has been present approximately 6 months. She has followed with Dr.Thimmappa in the past and has had a total of 3 applications of Theraskin (01/2015, 09/2015, 06/17/16). She has also followed with Dr. Con Memos here in the clinic and has received 2 applications of TheraSkin (11/10/15, 11/30/15). The patient does experience chronic, and is not amenable to debridement. She had a sickle cell crisis in December 2017, prior  to that has been several years. She is not currently on any antibiotic therapy and has not been treated with any recently. 07/17/2016 -- was seen by Dr. Iran Planas of plastic surgery who saw her 2 weeks postop application of Theraskin #3. She had removed her dressing and asked her to apply silver alginate on alternate days and follow-up back with the wound center. Future debridements and application of skin substitute would have to be done in the hospital due to her high risk for anesthesia. READMISSION 04/17/2021 Patient is now a 51 year old woman that we have had in this clinic for a prolonged period of time and 2016-2017 and then again for 2 visits in February 2018. At that point she had wounds on the right lower leg predominantly medial. She had also been seen by plastic surgery Dr. Leland Johns who I believe took her to the OR for operative debridement and application of TheraSkin in 2017. After she left our clinic she was followed for a very prolonged period of time in the wound care center in [facility] who then referred her ultimately to [facility] where she was seen by Dr. Vernona Rieger. Again taken her to the OR for skin grafting which apparently did not take. She had multiple other attempts at dressings although I have not really looked over all of these notes in great detail. She has not been seen in a wound care center in about a year. She states over the last year in addition to her right lower leg she has developed wounds on the left lower leg quite extensive. She is using Xeroform to all of these wounds without really any improvement. She also has Medicaid which does not cover wound products. The patient has had vascular work-ups in the past including most recently on 03/28/2021 showing biphasic waveforms on the right triphasic at the PTA and biphasic at the dorsalis pedis on the left. She was unable to tolerate any degree of compression to do ABIs. Unfortunately TBI's were also not done. She  had venous reflux studies done in 2017. This did not  show any evidence of a DVT or SVT and no venous incompetence was noted in the right leg at the time this was the only side with the wound As noted I did not look all over her old records. She apparently had a course of HBO and [facility] although I am not sure what the indication would have been. In any case she developed seizures and terminated treatment earlier. She is generally much more disabled than when we last saw her in clinic. She can no longer walk pretty much wheelchair-bound because predominantly of pain in the left hip. 04/24/2021; the patient tolerated the wraps we put on. We used Santyl and Hydrofera Blue under compression. I brought her back for a nurse visit for a change in dressing. With Medicaid we will have a hard time getting anything paid for and hence the need for compression. She arrives in clinic with all the wounds looking somewhat better in terms of surface 12/20; circumferential wound on the right from the lateral to the medial. She has open areas on the left medial and left lateral x2 on all of this with the same surface. This does not look completely healthy although she does have some epithelialization. She is not complaining of a lot of pain which is unusual for her sickle ulcers. I have not looked over her extensive records from [facility]. She had recent arterial studies and has a history of venous reflux studies I will need to look these over although I do not believe she has significant arterial disease 2023 05/22/2021; patient's wound areas measure slightly smaller. Still a lot of drainage coming from the right we have been using Hydrofera Blue and Santyl with some improvement in the wound surfaces. She tells me she will be getting transfused later in the week for her underlying sickle cell anemia I have looked over her recent arterial studies which were done in the fall. This was in November and showed biphasic and  triphasic waveforms but she could not tolerate ABIs because of pressure and unfortunately TBI's were not done. She has not had recent venous reflux studies that I can see 1/10; not much change about the same surface area. This has a yellowish surface to it very gritty. We have been using Santyl and Hydrofera Blue for a prolonged period. Culture I did last week showed methicillin sensitive staph aureus "rare". Our intake nurse reports greenish drainage which may be the Hydrofera Blue itself 1/17; wounds are continue to measure smaller although I am not sure about the accuracy here. Especially the areas on the right are covered in what looks to be a nonviable surface although she does have some epithelialization. Similarly she has areas on the left medial and left lateral ankle area which appear to have a better surface and perhaps are slightly smaller. We have been using Santyl and Hydrofera Blue. She cannot tolerate mechanical debridements She went for her reflux studies which showed significant reflux at the greater saphenous vein at the saphenofemoral junction as well as the greater saphenous vein in the proximal calf on the left she had reflux in the thigh and the common femoral vein and supra vein Fishel vein reflux in the greater saphenous vein. I will have vein and vascular look at this. My thoughts have been that these are likely sickle wounds. I looked through her old records from [facility] wound care center and then when she graduated to [facility] wound care center where she saw Dr. Zigmund Daniel and Dr.  Molnar. Although I can see she had reflux studies done I do not see that she actually saw a vein and vascular. I went over the fact that she had operative debridements and actual skin grafting that did not take. I do not think these wounds have ever really progressed towards healing 1/31;Substantial wounds on the right ankle area. Hyper granulated very gritty adherent debris on the surface.  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 On the left she has no DVT or SVT deep vein reflux in the common femoral vein. Superficial vein reflux in the greater saphenous vein on although the vein diameter is not really all that large. I do not think there is anything that can be done with these although I am going to send her for consultation to vein and vascular. 2/7; Wound exam; substantial wound area on the right posterior ankle area and areas on the left medial ankle and left lateral ankle. I was able to debride the left medial ankle last week fairly aggressively and it is back this week to a completely nonviable surface She will see vascular surgery this Friday and I would like them to review the venous studies and also any comments on her arterial status. If they do not see an issue here I am going to refer her to plastic surgery for an operative debridement perhaps intraoperative ACell or Integra. Eventually she will require a deep tissue culture again 2/14; substantial wound area on the right posterior ankle, medial ankle. We have been using silver alginate The patient was seen by vein and vascular she had both venous reflux studies and arterial studies. 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 She had no evidence of superficial venous thrombosis. She did have superficial vein reflux at the saphenofemoral junction and the greater saphenous vein. On the left no evidence of a DVT no evidence of superficial venous throat thrombosis she did have deep vein reflux  in the common femoral vein and superficial vein reflux in the greater saphenous vein but these were not felt to be amenable to ablation. In terms of arterial studies she had triphasic and wife biphasic wave waveforms bilaterally not felt to have a significant arterial issue. I do not get the feeling that they felt that any part of her nonhealing wounds were related to either arterial or venous issues. They did note that she had venous reflux at the right at the [facility] and G[facility]. And also on the left there were reflux in the deep system at the common femoral vein and greater saphenous vein in the proximal thigh. Nothing amenable to ablation. 2/20; she is making some decent progress on the right where there is nice skin between the 2 open areas on the right ankle. The surfaces here do not look viable yet there is some surrounding epithelialization. She still has a small area on the left medial ankle area. Hyper-granulated Jody's away always 2/28 patient has an appointment with plastic surgery on 3/8. We will see her back on 3/9. She may have to  call us to get the area redressed. We've been using Santyl under silver alginate. We made a nice improvement on the left medial ankle. The larger wounds on the right also looks somewhat better in terms of epithelialization although I think they could benefit from an aggressive debridement if plastic surgery would be willing to do that. Perhaps placement of Integra or a cell 07/26/2021: She saw Dr. Claudia Desanctis yesterday. He raised the question as to whether or not this might be pyoderma and wanted to wait until that question was answered by dermatology before proceeding with any sort of operative debridement. We have continue to use Santyl under silver alginate with Kerlix and Coban wraps. Overall, her wounds appear to be continuing to contract and epithelialize, with some granulation tissue present. There continues to be some slough on all wound surfaces. 08/09/2021: She has  not been able to get an appointment with dermatology because apparently the offices in [place] do not accept Medicaid. She is looking into whether or not she can be seen at the main [facility] dermatology clinic. This is necessary because plastic surgery is concerned that her wounds might represent pyoderma and they did not want to do any procedure until that was clarified. We have been using Santyl under silver alginate with Kerlix and Coban wraps. Today, there was a greater amount of drainage on her dressings with a slight green discoloration and significant odor. Despite this, her wounds continue to contract and epithelialize. There is pale granulation tissue present and actually, on the left medial ankle, the granulation tissue is a bit hypertrophic. 08/16/2021: Last week, I took a culture and this grew back rare methicillin-resistant Staph aureus and rare corynebacterium. The MRSA was sensitive to gentamicin which we began applying topically on an empiric basis. This week, her wounds are a bit smaller and the drainage and odor are less. Her primary care provider is working on assisting the patient with a dermatology evaluation. She has been in silver alginate over the gentamicin that was started last week along with Kerlix and Coban wraps. 08/23/2021: Because she has Medicaid, we have been unable to get her into see any dermatologist in the [place] to rule out pyoderma gangrenosum, which was a requirement from plastic surgery prior to any sort of debridement and grafting. Despite this, however, all of her wounds continue to get smaller. The wound on her left medial ankle is nearly closed. There is no odor from the wounds, although she still accumulates a modest amount of drainage on her dressings. 08/30/2021: The lateral right ankle wound and the medial left ankle wound are a bit smaller today. The medial right ankle wound is about the same size. They are less tender. We have  still been unable to get her into dermatology. 09/06/2021: All of the wounds are about the same size today. She continues to endorse minimal pain. I communicated with Dr. Claudia Desanctis in plastic surgery regarding our issues getting a dermatology appointment; he was out of town but indicated that he would look into perhaps performing the biopsy in his office and will have his office contact her. 09/14/2021: The patient has an appointment in dermatology, but it is not until October. Her wounds are roughly the same; she continues to have very thick purulent-looking drainage on her dressings. 09/20/2021: The left medial wound is nearly closed and just has a bit of accumulated eschar on the surface. The right medial and lateral ankle wounds are perhaps a little bit smaller. They  continue to have a very pale surface with accumulation of thin slough. 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 I did not think [employer] was indicated based on this. She is getting topical mupirocin with Prisma silver collagen. 10/04/2021: The patient was not seen in clinic last week due to childcare coverage issues. In the interim, the left medial leg wound has closed. The right sided leg wounds are smaller. There is more granulation tissue coming through, particularly on the lateral wound. The surface remains somewhat gritty. We have been applying topical mupirocin and Prisma silver collagen. 10/11/2021: The left medial leg wound remains closed. She does complain of some anesthetic sensation to the area. Both of the right-sided leg wounds are smaller but still have accumulated slough. 10/18/2021: Both right-sided leg wounds are minimally smaller this week. She still continues to accumulate slough and has thick drainage on her dressings. 10/23/2021: Both wounds continue to contract. There is still slough buildup. She has been approved for a keratin-based skin substitute trial product but it will not be available until  next week. 10/30/2021: The wounds are about the same to perhaps slightly smaller. There is still continued slough buildup. Unfortunately, the rep for the keratin based product did not show up today and did not answer his phone when called. 11/08/2021: The wounds are little bit smaller today. She continues to have thick drainage but the surfaces are relatively clean with just a little bit of slough accumulation. She reported to me today that she is unable to completely flex her left ankle and on examination it seems this is potentially related to scar tissue from her wounds. We do have the Pro[employer] trial product available for her today. 11/15/2021: Both wounds are smaller today. There is some slough accumulation on the surfaces, but the medial wound, in particular looks like it is filling in and is less deep. She did hear from physical therapy and she is going to start working with them on July 11. She is here for her second application of the trial skin substitute, ProgenaMatrix. 11/22/2021: Both wounds continue to contract, the medial more dramatically than the lateral. Both wounds have a layer of slough on the surface, but underneath this, the gritty fibrous tissue has a little bit more of a pink cast to it rather than being as pale as it has been. 11/29/2021: The wounds are roughly the same size this week, perhaps a millimeter or 2 smaller. The medial wound has filled in and is nearly flush with the surrounding skin surface. She continues to have a lot of slough accumulation on both surfaces. 12/06/2021: No significant change to her wounds, but she has a new opening on her dorsal foot, just distal to the right lateral ankle wound. The area on her left medial ankle that reopened looks a little bit larger today. She has quite a bit of pain associated with the new wound. 12/12/2021: Her wounds look about the same but the new opening on her right lateral dorsal foot is a little bit bigger. She continues  to have a fair amount of pain with this wound. 12/26/2021: The left medial ankle wound is tiny and superficial. She has 2 areas of crusting on her left lateral ankle, however, that appear to be threatening to open again. Her right medial ankle wound is a little bit smaller today but still continues to accumulate thick rubbery slough. The new dorsal foot wound is exquisitely painful but there is no odor or purulent  drainage. No erythema or induration. The right lateral ankle wound looks about the same today, again with thick rubbery slough. 01/03/2022: The left medial ankle wound has closed again. Both right ankle wounds appear to be about the same size with thick rubbery slough. The dorsal foot wound on the right continues to be quite painful and she stated that she did not want any debridement of that site today. 01/10/2022: No real change to any of her wounds. She continues to accumulate thick slough. The dorsal foot wound has merged with the lateral malleolar wound. She is experiencing significant pain in the dorsal foot portion of the ulcer. 01/16/2022: Absolutely no change or progress in her wounds. 01/24/2022: Her wounds are unchanged. She continues to build up slough and the wounds on her dorsal right foot are still exquisitely tender. Electronic Signature(s) Signed: 01/24/2022 10:02:33 AM By: Fredirick Maudlin MD FACS Entered By: Fredirick Maudlin on 01/24/2022 10:02:33 -------------------------------------------------------------------------------- Physical Exam Details Patient Name: Date of Service: [name], [name] 01/24/2022 9:30 A M Medical Record Number: [id number] Patient Account Number: [id number] Date of Birth/Sex: Treating RN: [birth year] (51 y.o. F) Primary Care Provider: [name] Other Clinician: Referring Provider: Treating Provider/Extender: Elyse Jarvis Weeks in Treatment: 40 Constitutional . . . . No acute distress.Marland Kitchen Respiratory Normal work of  breathing on room air.. Notes 01/24/2022: Her wounds are unchanged. She continues to build up slough and the wounds on her dorsal right foot are still exquisitely tender. Electronic Signature(s) Signed: 01/24/2022 10:04:01 AM By: Fredirick Maudlin MD FACS Entered By: Fredirick Maudlin on 01/24/2022 10:04:01 -------------------------------------------------------------------------------- Physician Orders Details Patient Name: Date of Service: [name], [name] 01/24/2022 9:30 A M Medical Record Number: [id number] Patient Account Number: [id number] Date of Birth/Sex: Treating RN: 15-Feb-1971 (51 y.o. [name] Primary Care Provider: [name] Other Clinician: Referring Provider: Treating Provider/Extender: Elyse Jarvis Weeks in Treatment: 23 Verbal / Phone Orders: No Diagnosis Coding ICD-10 Coding Code Description L97.818 Non-pressure chronic ulcer of other part of right lower leg with other specified severity 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 Follow-up Appointments ppointment in 1 week. - Dr. Celine Ahr Room 3 Return A Thursday 01/31/22 at 12:30pm Anesthetic (In clinic) Topical Lidocaine 5% applied to wound bed (In clinic) Topical Lidocaine 4% applied to wound bed - In Clinic Bathing/ Shower/ Hygiene May shower with protection but do not get wound dressing(s) wet. - Can get cast protector bags at [facility] or [facility] Edema Control - Lymphedema / SCD / Other Elevate legs to the level of the heart or above for 30 minutes daily and/or when sitting, a frequency of: - throughout the day Avoid standing for long periods of time. Exercise regularly Additional Orders / Instructions Follow Nutritious Diet Wound Treatment Wound #17 - Lower Leg Wound Laterality: Right, Lateral Cleanser: Soap and Water Every Other Day/30 Days Discharge Instructions: May shower and wash wound with dial antibacterial soap and water prior to dressing change. Cleanser: Wound Cleanser  Every Other Day/30 Days Discharge Instructions: Cleanse the wound with wound cleanser prior to applying a clean dressing using gauze sponges, not tissue or cotton balls. Peri-Wound Care: Triamcinolone 15 (g) Every Other Day/30 Days Discharge Instructions: Use triamcinolone 15 (g) as directed Peri-Wound Care: Sween Lotion (Moisturizing lotion) Every Other Day/30 Days Discharge Instructions: Apply moisturizing lotion as directed Secondary Dressing: ABD Pad, 5x9 Every Other Day/30 Days Discharge Instructions: Apply over primary dressing as directed. Secondary Dressing: Zetuvit Plus 4x8 in Every Other Day/30 Days Discharge Instructions: Apply  over primary dressing as directed. Secured With: Elastic Bandage 4 inch (ACE bandage) Every Other Day/30 Days Discharge Instructions: Secure with ACE bandage as directed. Compression Wrap: Kerlix Roll 4.5x3.1 (in/yd) Every Other Day/30 Days Discharge Instructions: Apply Kerlix and Coban compression as directed. Wound #21 - Ankle Wound Laterality: Right, Medial Cleanser: Soap and Water Every Other Day/30 Days Discharge Instructions: May shower and wash wound with dial antibacterial soap and water prior to dressing change. Cleanser: Wound Cleanser Every Other Day/30 Days Discharge Instructions: Cleanse the wound with wound cleanser prior to applying a clean dressing using gauze sponges, not tissue or cotton balls. Peri-Wound Care: Triamcinolone 15 (g) Every Other Day/30 Days Discharge Instructions: Use triamcinolone 15 (g) as directed Peri-Wound Care: Sween Lotion (Moisturizing lotion) Every Other Day/30 Days Discharge Instructions: Apply moisturizing lotion as directed Secondary Dressing: ABD Pad, 5x9 Every Other Day/30 Days Discharge Instructions: Apply over primary dressing as directed. Secondary Dressing: Zetuvit Plus 4x8 in Every Other Day/30 Days Discharge Instructions: Apply over primary dressing as directed. Secured With: Elastic Bandage 4 inch (ACE  bandage) Every Other Day/30 Days Discharge Instructions: Secure with ACE bandage as directed. Compression Wrap: Kerlix Roll 4.5x3.1 (in/yd) Every Other Day/30 Days Discharge Instructions: Apply Kerlix and Coban compression as directed. Electronic Signature(s) Signed: 01/24/2022 10:48:39 AM By: Fredirick Maudlin MD FACS Signed: 01/24/2022 5:06:31 PM By: Dellie Catholic RN Entered By: Dellie Catholic on 01/24/2022 10:07:40 -------------------------------------------------------------------------------- Problem List Details Patient Name: Date of Service: [name], [name] 01/24/2022 9:30 A M Medical Record Number: [id number] Patient Account Number: [id number] Date of Birth/Sex: Treating RN: 1971/02/06 (51 y.o. F) Primary Care Provider: [name] Other Clinician: Referring Provider: Treating Provider/Extender: Elyse Jarvis Weeks in Treatment: 65 Active Problems ICD-10 Encounter Code Description Active Date MDM Diagnosis L97.818 Non-pressure chronic ulcer of other part of right lower leg with other specified 04/17/2021 No Yes severity 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 04/17/2021 No Yes Inactive Problems ICD-10 Code Description Active Date Inactive Date L97.828 Non-pressure chronic ulcer of other part of left lower leg with other specified severity 04/17/2021 04/17/2021 Resolved Problems Electronic Signature(s) Signed: 01/24/2022 10:01:11 AM By: Fredirick Maudlin MD FACS Entered By: Fredirick Maudlin on 01/24/2022 10:01:11 -------------------------------------------------------------------------------- Progress Note Details Patient Name: Date of Service: [name], [name] 01/24/2022 9:30 A M Medical Record Number: [id number] Patient Account Number: [id number] Date of Birth/Sex: Treating RN: Oct 23, 1970 (51 y.o. F) Primary Care Provider: [name] Other Clinician: Referring Provider: Treating Provider/Extender: Elyse Jarvis Weeks  in Treatment: 34 Subjective Chief Complaint Information obtained from Patient the patient is here for evaluation of her bilateral lower extremity sickle cell ulcers 04/17/2021; patient comes in for substantial wounds on the right and left lower leg History of Present Illness (HPI) The following HPI elements were documented for the patient's wound: Location: medial and lateral ankle region on the right and left medial malleolus Quality: Patient reports experiencing a shooting pain to affected area(s). Severity: Patient states wound(s) are getting worse. Duration: right lower extremity bimalleolar ulcers have been present for approximately 2 years; the rright meedial malleolus ulcer has been there proximally 6 months Timing: Pain in wound is constant (hurts all the time) Context: The wound would happen gradually Associated Signs and Symptoms: Patient reports having increase discharge. 51 year old patient with a history of sickle cell anemia who was last seen by me with ulceration of the right lower extremity above the ankle and was referred to Dr. Leland Johns for a surgical debridement as I was unable  to do anything in the office due to excruciating pain. At that stage she was referred from the plastic surgery service to dermatology who treated her for a skin infection with doxycycline and then Levaquin and a local antibiotic ointment. I understand the patient has since developed ulceration on the left ankle both medial and lateral and was now referred back to the wound center as dermatology has finished the management. I do not have any notes from the dermatology department Old notes: 51 year old patient with a history of sickle cell anemia, pain bilateral lower extremities, right lower extremity ulcer and has a history of receiving a skin graft( Theraskin) several months ago. She has been visiting the wound center [facility] and was seen by Dr. Dellia Nims and Dr. Leland Johns. after prolonged  conservator therapy between July 2016 and January 2017. She had been seen by the plastic surgeon and taken to the OR for debridement and application of Theraskin. She had 3 applications of Theraskin and was then treated with collagen. Prior to that she had a history of similar problems in 2014 and was treated conservatively. Had a reflux study done for the right lower extremity in August 2016 without reflux or DVT . Past medical history significant for sickle cell disease, anemia, leg ulcers, cholelithiasis,and has never been a smoker. Once the patient was discharged on the wound center she says within 2 or 3 weeks the problems recurred and she has been treating it conservatively. since I saw her 3 weeks ago at [facility] she has been unable to get her dressing material but has completed a course of doxycycline. 6/7/ 2017 -- lower extremity venous duplex reflux evaluation was done oo No evidence of SVT or DVT in the RLL. No venous incompetence in the RLL. No further vascular workup is indicated at this time. She was seen by Dr. [name], on 10/04/2015. She agreed with the plan of taking her to the OR for debridement and application of theraskin and would also take biopsies to rule out pyoderma gangrenosum. Follow-up note dated May 31 received and she was status post application of Theraskin to multiple ulcers around the right ankle. Pathology did not show evidence of malignancy or pyoderma gangrenosum. She would continue to see as in the wound clinic for further care and see Dr. Leland Johns as needed. The patient brought the biopsy report and it was consistent with stasis ulcer no evidence of malignancy and the comment was that there was some adjacent neovascularization, fibrosis and patchy perivascular chronic inflammation. 11/15/2015 -- today we applied her first application of Theraskin 11/30/15; TheraSkin #2 12/13/2015 -- she is having a lot of pain locally and is here for possible  application of a theraskin today. 01/16/2016 -- the patient has significant pain and has noticed despite in spite of all local care and oral pain medication. It is impossible to debride her in the office. 02/06/2016 -- I do not see any notes from Dr. Iran Planas( the patient has not made a call to the office know as she heard from them) and the only visit to recently was with her PCP Dr. Danella Penton -- I saw her on 01/16/2016 and prescribed 90 tablets of oxycodone 10 mg and did lab work and screening for HIV. the HIV was negative and hemoglobin was 6.3 with a WBC count of 14.9 and hematocrit of 17.8 with platelets of 561. reticulocyte count was 15.5% READMISSION: 07/10/2016- The patient is here for readmission for bilateral lower extremity ulcers in the presence of sickle  cell. The bimalleolar ulcers to the right lower extremity have been present for approximately 2 years, the left medial malleolus ulcer has been present approximately 6 months. She has followed with Dr.Thimmappa in the past and has had a total of 3 applications of Theraskin (01/2015, 09/2015, 06/17/16). She has also followed with Dr. Con Memos here in the clinic and has received 2 applications of TheraSkin (11/10/15, 11/30/15). The patient does experience chronic, and is not amenable to debridement. 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 She is not currently on any antibiotic therapy and has not been treated with any recently. 07/17/2016 -- was seen by Dr. Iran Planas of plastic surgery who saw her 2 weeks postop application of Theraskin #3. She had removed her dressing and asked her to apply silver alginate on alternate days and follow-up back with the wound center. Future debridements and application of skin substitute would have to be done in the hospital due to her high risk for anesthesia. READMISSION 04/17/2021 Patient is now a 51 year old woman that we have had in this clinic for a prolonged  period of time and 2016-2017 and then again for 2 visits in February 2018. At that point she had wounds on the right lower leg predominantly medial. She had also been seen by plastic surgery Dr. Leland Johns who I believe took her to the OR for operative debridement and application of TheraSkin in 2017. After she left our clinic she was followed for a very prolonged period of time in the wound care center in [facility] who then referred her ultimately to [facility] where she was seen by Dr. Vernona Rieger. Again taken her to the OR for skin grafting which apparently did not take. She had multiple other attempts at dressings although I have not really looked over all of these notes in great detail. She has not been seen in a wound care center in about a year. She states over the last year in addition to her right lower leg she has developed wounds on the left lower leg quite extensive. She is using Xeroform to all of these wounds without really any improvement. She also has Medicaid which does not cover wound products. The patient has had vascular work-ups in the past including most recently on 03/28/2021 showing biphasic waveforms on the right triphasic at the PTA and biphasic at the dorsalis pedis on the left. She was unable to tolerate any degree of compression to do ABIs. Unfortunately TBI's were also not done. She had venous reflux studies done in 2017. This did not show any evidence of a DVT or SVT and no venous incompetence was noted in the right leg at the time this was the only side with the wound As noted I did not look all over her old records. She apparently had a course of HBO and [facility] although I am not sure what the indication would have been. In any case she developed seizures and terminated treatment earlier. She is generally much more disabled than when we last saw her in clinic. She can no longer walk pretty much wheelchair-bound because predominantly of pain in the left hip. 04/24/2021; the patient  tolerated the wraps we put on. We used Santyl and Hydrofera Blue under compression. I brought her back for a nurse visit for a change in dressing. With Medicaid we will have a hard time getting anything paid for and hence the need for compression. She arrives in clinic with all the wounds  looking somewhat better in terms of surface 12/20; circumferential wound on the right from the lateral to the medial. She has open areas on the left medial and left lateral x2 on all of this with the same surface. This does not look completely healthy although she does have some epithelialization. She is not complaining of a lot of pain which is unusual for her sickle ulcers. I have not looked over her extensive records from [facility]. She had recent arterial studies and has a history of venous reflux studies I will need to look these over although I do not believe she has significant arterial disease 2023 05/22/2021; patient's wound areas measure slightly smaller. Still a lot of drainage coming from the right we have been using Hydrofera Blue and Santyl with some improvement in the wound surfaces. She tells me she will be getting transfused later in the week for her underlying sickle cell anemia I have looked over her recent arterial studies which were done in the fall. This was in November and showed biphasic and triphasic waveforms but she could not tolerate ABIs because of pressure and unfortunately TBI's were not done. She has not had recent venous reflux studies that I can see 1/10; not much change about the same surface area. This has a yellowish surface to it very gritty. We have been using Santyl and Hydrofera Blue for a prolonged period. Culture I did last week showed methicillin sensitive staph aureus "rare". Our intake nurse reports greenish drainage which may be the Hydrofera Blue itself 1/17; wounds are continue to measure smaller although I am not sure about the accuracy here. Especially the areas on the  right are covered in what looks to be a nonviable surface although she does have some epithelialization. Similarly she has areas on the left medial and left lateral ankle area which appear to have a better surface and perhaps are slightly smaller. We have been using Santyl and Hydrofera Blue. She cannot tolerate mechanical debridements She went for her reflux studies which showed significant reflux at the greater saphenous vein at the saphenofemoral junction as well as the greater saphenous vein in the proximal calf on the left she had reflux in the thigh and the common femoral vein and supra vein Fishel vein reflux in the greater saphenous vein. I will have vein and vascular look at this. My thoughts have been that these are likely sickle wounds. I looked through her old records from [facility] wound care center and then when she graduated to [facility] wound care center where she saw Dr. Zigmund Daniel and Dr. Vernona Rieger. Although I can see she had reflux studies done I do not see that she actually saw a vein and vascular. I went over the fact that she had operative debridements and actual skin grafting that did not take. I do not think these wounds have ever really progressed towards healing 1/31;Substantial wounds on the right ankle area. Hyper granulated very gritty adherent debris on the surface. 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 On the left she has no DVT or SVT deep vein reflux in the common femoral vein.  Superficial vein reflux in the greater saphenous vein on although the vein diameter is not really all that large. I do not think  there is anything that can be done with these although I am going to send her for consultation to vein and vascular. 2/7; Wound exam; substantial wound area on the right posterior ankle area and areas on the left medial ankle and left lateral ankle. I was able to debride the left medial ankle last week fairly aggressively and it is back this week to a completely nonviable surface She will see vascular surgery this Friday and I would like them to review the venous studies and also any comments on her arterial status. If they do not see an issue here I am going to refer her to plastic surgery for an operative debridement perhaps intraoperative ACell or Integra. Eventually she will require a deep tissue culture again 2/14; substantial wound area on the right posterior ankle, medial ankle. We have been using silver alginate The patient was seen by vein and vascular she had both venous reflux studies and arterial studies. 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 She had no evidence of superficial venous thrombosis. She did have superficial vein reflux at the saphenofemoral junction and the greater saphenous vein. On the left no evidence of a DVT no evidence of superficial venous throat thrombosis she did have deep vein reflux in the common femoral vein and superficial vein reflux in the greater saphenous vein but these were not felt to be amenable to ablation. In terms of arterial studies she had triphasic and wife biphasic wave waveforms bilaterally not felt to have a significant arterial issue. I do not get the feeling that they felt that any part of her nonhealing wounds were related to either arterial or venous issues. They did note that she had venous reflux at the right at the [facility] and [facility]. And also on the left there were reflux in the deep system at the common femoral vein and greater saphenous vein in the proximal  thigh. Nothing amenable to ablation. 2/20; she is making some decent progress on the right where there is nice skin between the 2 open areas on the right ankle. The surfaces here do not look viable yet there is some surrounding epithelialization. She still has a small area on the left medial ankle area. Hyper-granulated Jody's away always 2/28 patient has an appointment with plastic surgery on 3/8. We will see her back on 3/9. She may have to call us to get the area redressed. We've been using Santyl under silver alginate. We made a nice improvement on the left medial ankle. The larger wounds on the right also looks somewhat better in terms of epithelialization although I think they could benefit from an aggressive debridement if plastic surgery would be willing to do that. Perhaps placement of Integra or a cell 07/26/2021: She saw Dr. Claudia Desanctis yesterday. He raised the question as to whether or not this might be pyoderma and wanted to wait until that question was answered by dermatology before proceeding with any sort of operative debridement. We have continue to use Santyl under silver alginate with Kerlix and Coban wraps. Overall, her wounds appear to be continuing to contract and epithelialize, with some granulation tissue present. There continues to be some slough on all wound surfaces. 08/09/2021: She has not been able to get an appointment with dermatology because apparently  the offices in [place] do not accept Medicaid. She is looking into whether or not she can be seen at the main [facility] dermatology clinic. This is necessary because plastic surgery is concerned that her wounds might represent pyoderma and they did not want to do any procedure until that was clarified. We have been using Santyl under silver alginate with Kerlix and Coban wraps. Today, there was a greater amount of drainage on her dressings with a slight green discoloration and significant odor. Despite  this, her wounds continue to contract and epithelialize. There is pale granulation tissue present and actually, on the left medial ankle, the granulation tissue is a bit hypertrophic. 08/16/2021: Last week, I took a culture and this grew back rare methicillin-resistant Staph aureus and rare corynebacterium. The MRSA was sensitive to gentamicin which we began applying topically on an empiric basis. This week, her wounds are a bit smaller and the drainage and odor are less. Her primary care provider is working on assisting the patient with a dermatology evaluation. She has been in silver alginate over the gentamicin that was started last week along with Kerlix and Coban wraps. 08/23/2021: Because she has Medicaid, we have been unable to get her into see any dermatologist in [place] to rule out pyoderma gangrenosum, which was a requirement from plastic surgery prior to any sort of debridement and grafting. Despite this, however, all of her wounds continue to get smaller. The wound on her left medial ankle is nearly closed. There is no odor from the wounds, although she still accumulates a modest amount of drainage on her dressings. 08/30/2021: The lateral right ankle wound and the medial left ankle wound are a bit smaller today. The medial right ankle wound is about the same size. They are less tender. We have still been unable to get her into dermatology. 09/06/2021: All of the wounds are about the same size today. She continues to endorse minimal pain. I communicated with Dr. Claudia Desanctis in plastic surgery regarding our issues getting a dermatology appointment; he was out of town but indicated that he would look into perhaps performing the biopsy in his office and will have his office contact her. 09/14/2021: The patient has an appointment in dermatology, but it is not until October. Her wounds are roughly the same; she continues to have very thick purulent-looking drainage on her dressings. 09/20/2021: The left  medial wound is nearly closed and just has a bit of accumulated eschar on the surface. The right medial and lateral ankle wounds are perhaps a little bit smaller. They continue to have a very pale surface with accumulation of thin slough. 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 I did not think [employer] was indicated based on this. She is getting topical mupirocin with Prisma silver collagen. 10/04/2021: The patient was not seen in clinic last week due to childcare coverage issues. In the interim, the left medial leg wound has closed. The right sided leg wounds are smaller. There is more granulation tissue coming through, particularly on the lateral wound. The surface remains somewhat gritty. We have been applying topical mupirocin and Prisma silver collagen. 10/11/2021: The left medial leg wound remains closed. She does complain of some anesthetic sensation to the area. Both of the right-sided leg wounds are smaller but still have accumulated slough. 10/18/2021: Both right-sided leg wounds are minimally smaller this week. She still continues to accumulate slough and has thick drainage on her dressings.  10/23/2021: Both wounds continue to contract. There is still slough buildup. She has been approved for a keratin-based skin substitute trial product but it will not be available until next week. 10/30/2021: The wounds are about the same to perhaps slightly smaller. There is still continued slough buildup. Unfortunately, the rep for the keratin based product did not show up today and did not answer his phone when called. 11/08/2021: The wounds are little bit smaller today. She continues to have thick drainage but the surfaces are relatively clean with just a little bit of slough accumulation. She reported to me today that she is unable to completely flex her left ankle and on examination it seems this is potentially related to scar tissue from her wounds. We do have the ProgenaMatrix  trial product available for her today. 11/15/2021: Both wounds are smaller today. There is some slough accumulation on the surfaces, but the medial wound, in particular looks like it is filling in and is less deep. She did hear from physical therapy and she is going to start working with them on July 11. She is here for her second application of the trial skin substitute, ProgenaMatrix. 11/22/2021: Both wounds continue to contract, the medial more dramatically than the lateral. Both wounds have a layer of slough on the surface, but underneath this, the gritty fibrous tissue has a little bit more of a pink cast to it rather than being as pale as it has been. 11/29/2021: The wounds are roughly the same size this week, perhaps a millimeter or 2 smaller. The medial wound has filled in and is nearly flush with the surrounding skin surface. She continues to have a lot of slough accumulation on both surfaces. 12/06/2021: No significant change to her wounds, but she has a new opening on her dorsal foot, just distal to the right lateral ankle wound. The area on her left medial ankle that reopened looks a little bit larger today. She has quite a bit of pain associated with the new wound. 12/12/2021: Her wounds look about the same but the new opening on her right lateral dorsal foot is a little bit bigger. She continues to have a fair amount of pain with this wound. 12/26/2021: The left medial ankle wound is tiny and superficial. She has 2 areas of crusting on her left lateral ankle, however, that appear to be threatening to open again. Her right medial ankle wound is a little bit smaller today but still continues to accumulate thick rubbery slough. The new dorsal foot wound is exquisitely painful but there is no odor or purulent drainage. No erythema or induration. The right lateral ankle wound looks about the same today, again with thick rubbery slough. 01/03/2022: The left medial ankle wound has closed again. Both  right ankle wounds appear to be about the same size with thick rubbery slough. The dorsal foot wound on the right continues to be quite painful and she stated that she did not want any debridement of that site today. 01/10/2022: No real change to any of her wounds. She continues to accumulate thick slough. The dorsal foot wound has merged with the lateral malleolar wound. She is experiencing significant pain in the dorsal foot portion of the ulcer. 01/16/2022: Absolutely no change or progress in her wounds. 01/24/2022: Her wounds are unchanged. She continues to build up slough and the wounds on her dorsal right foot are still exquisitely tender. Patient History Information obtained from Patient. Family History Diabetes - Mother, Lung Disease - Mother,  No family history of Cancer, Heart Disease, Hereditary Spherocytosis, Hypertension, Kidney Disease, Seizures, Stroke, Thyroid Problems, Tuberculosis. Social History Never smoker, Marital Status - Married, Alcohol Use - Never, Drug Use - No History, Caffeine Use - Daily. Medical History Eyes Denies history of Cataracts, Glaucoma, Optic Neuritis Ear/Nose/Mouth/Throat Denies history of Chronic sinus problems/congestion, Middle ear problems Hematologic/Lymphatic Patient has history of Anemia, Sickle Cell Disease Denies history of Hemophilia, Human Immunodeficiency Virus, Lymphedema Respiratory Denies history of Aspiration, Asthma, Chronic Obstructive Pulmonary Disease (COPD), Pneumothorax, Sleep Apnea, Tuberculosis Cardiovascular Denies history of Angina, Arrhythmia, Congestive Heart Failure, Coronary Artery Disease, Deep Vein Thrombosis, Hypertension, Hypotension, Myocardial Infarction, Peripheral Arterial Disease, Peripheral Venous Disease, Phlebitis, Vasculitis Gastrointestinal Denies history of Cirrhosis , Colitis, Crohnoos, Hepatitis A, Hepatitis B, Hepatitis C Endocrine Denies history of Type I Diabetes, Type II  Diabetes Genitourinary Denies history of End Stage Renal Disease Immunological Denies history of Lupus Erythematosus, Raynaudoos, Scleroderma Integumentary (Skin) Denies history of History of Burn Musculoskeletal Denies history of Gout, Rheumatoid Arthritis, Osteoarthritis, Osteomyelitis Neurologic Patient has history of Neuropathy - right foot intermittant Denies history of Dementia, Quadriplegia, Paraplegia, Seizure Disorder Oncologic Denies history of Received Chemotherapy, Received Radiation Psychiatric Denies history of Anorexia/bulimia, Confinement Anxiety Hospitalization/Surgery History - c section x2. - left breast lumpectomy. - iandD right ankle with theraskin. Medical A Surgical History Notes nd Constitutional Symptoms (General Health) H/O miscarriage Cardiovascular bradycardia Gastrointestinal cholilithiasis Objective Constitutional No acute distress.. Vitals Time Taken: 9:33 AM, Height: 67 in, Temperature: 98.3 F, Pulse: 79 bpm, Respiratory Rate: 16 breaths/min, Blood Pressure: 136/71 mmHg. Respiratory Normal work of breathing on room air.. General Notes: 01/24/2022: Her wounds are unchanged. She continues to build up slough and the wounds on her dorsal right foot are still exquisitely tender. Integumentary (Hair, Skin) Wound #17 status is Open. Original cause of wound was Gradually Appeared. The date acquired was: 10/05/2012. The wound has been in treatment 40 weeks. The wound is located on the Right,Lateral Lower Leg. The wound measures 9.5cm length x 5.8cm width x 0.1cm depth; 43.275cm^2 area and 4.328cm^3 volume. There is Fat Layer (Subcutaneous Tissue) exposed. There is no tunneling or undermining noted. There is a large amount of serosanguineous drainage noted. The wound margin is distinct with the outline attached to the wound base. There is small (1-33%) pink, pale granulation within the wound bed. There is a large (67-100%) amount of necrotic tissue within  the wound bed including Adherent Slough. Wound #21 status is Open. Original cause of wound was Gradually Appeared. The date acquired was: 06/26/2021. The wound has been in treatment 30 weeks. The wound is located on the Right,Medial Ankle. The wound measures 6cm length x 2.3cm width x 0.1cm depth; 10.838cm^2 area and 1.084cm^3 volume. There is Fat Layer (Subcutaneous Tissue) exposed. There is no tunneling or undermining noted. There is a medium amount of serosanguineous drainage noted. The wound margin is distinct with the outline attached to the wound base. There is medium (34-66%) pink granulation within the wound bed. There is a medium (34- 66%) amount of necrotic tissue within the wound bed including Adherent Slough. 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 There was a Selective/Open Wound Non-Viable Tissue Debridement with a total area of 55.1 sq cm performed by Fredirick Maudlin, MD. With the following instrument(s): Curette to remove Non-Viable  tissue/material. Material removed includes Slough after achieving pain control using Lidocaine 4% Topical Solution. No specimens were taken. A time out was conducted at 09:55, prior to the start of the procedure. A Minimum amount of bleeding was controlled with Pressure. The procedure was tolerated well with a pain level of 0 throughout and a pain level of 0 following the procedure. Post Debridement Measurements: 9.5cm length x 5.8cm width x 0.1cm depth; 4.328cm^3 volume. Character of Wound/Ulcer Post Debridement is improved. Post procedure Diagnosis Wound #17: Same as Pre-Procedure Wound #21 Pre-procedure diagnosis of Wound #21 is a Sickle Cell Lesion located on the Right,Medial Ankle .Severity of Tissue Pre  Debridement is: Fat layer exposed. There was a Selective/Open Wound Non-Viable Tissue Debridement with a total area of 13.8 sq cm performed by Fredirick Maudlin, MD. With the following instrument(s): Curette to remove Non-Viable tissue/material. Material removed includes [facility] after achieving pain control using Lidocaine 4% Topical Solution. No specimens were taken. A time out was conducted at 09:55, prior to the start of the procedure. A Minimum amount of bleeding was controlled with Pressure. The procedure was tolerated well with a pain level of 0 throughout and a pain level of 0 following the procedure. Post Debridement Measurements: 6cm length x 2.3cm width x 0.1cm depth; 1.084cm^3 volume. Character of Wound/Ulcer Post Debridement is improved. Severity of Tissue Post Debridement is: Fat layer exposed. Post procedure Diagnosis Wound #21: Same as Pre-Procedure Plan Follow-up Appointments: Return Appointment in 1 week. - Dr. Celine Ahr Room 3 Thursday 01/31/22 at 12:30pm Anesthetic: (In clinic) Topical Lidocaine 5% applied to wound bed (In clinic) Topical Lidocaine 4% applied to wound bed - In Clinic Bathing/ Shower/ Hygiene: May shower with protection but do not get wound dressing(s) wet. - Can get cast protector bags at [facility] or [facility] Edema Control - Lymphedema / SCD / Other: Elevate legs to the level of the heart or above for 30 minutes daily and/or when sitting, a frequency of: - throughout the day Avoid standing for long periods of time. Exercise regularly Additional Orders / Instructions: Follow Nutritious Diet WOUND #17: - Lower Leg Wound Laterality: Right, Lateral Cleanser: Soap and Water 1 x Per Week/30 Days Discharge Instructions: May shower and wash wound with dial antibacterial soap and water prior to dressing change. Cleanser: Wound Cleanser 1 x Per Week/30 Days Discharge Instructions: Cleanse the wound with wound cleanser prior to applying a clean dressing using gauze sponges,  not tissue or cotton balls. Peri-Wound Care: Triamcinolone 15 (g) 1 x Per Week/30 Days Discharge Instructions: Use triamcinolone 15 (g) as directed Peri-Wound Care: Sween Lotion (Moisturizing lotion) 1 x Per Week/30 Days Discharge Instructions: Apply moisturizing lotion as directed Secondary Dressing: ABD Pad, 5x9 1 x Per Week/30 Days Discharge Instructions: Apply over primary dressing as directed. Secondary Dressing: Zetuvit Plus 4x8 in 1 x Per Week/30 Days Discharge Instructions: Apply over primary dressing as directed. Secured With: Elastic Bandage 4 inch (ACE bandage) 1 x Per Week/30 Days Discharge Instructions: Secure with ACE bandage as directed. Com pression Wrap: Kerlix Roll 4.5x3.1 (in/yd) 1 x Per Week/30 Days Discharge Instructions: Apply Kerlix and Coban compression as directed. WOUND #21: - Ankle Wound Laterality: Right, Medial Cleanser: Soap and Water 1 x Per Week/30 Days Discharge Instructions: May shower and wash wound with dial antibacterial soap and water prior to dressing change. Cleanser: Wound Cleanser 1 x Per Week/30 Days Discharge Instructions: Cleanse the wound with wound cleanser prior to applying a clean dressing using gauze sponges, not tissue or cotton  balls. Peri-Wound Care: Triamcinolone 15 (g) 1 x Per Week/30 Days Discharge Instructions: Use triamcinolone 15 (g) as directed Peri-Wound Care: Sween Lotion (Moisturizing lotion) 1 x Per Week/30 Days Discharge Instructions: Apply moisturizing lotion as directed Secondary Dressing: ABD Pad, 5x9 1 x Per Week/30 Days Discharge Instructions: Apply over primary dressing as directed. Secondary Dressing: Zetuvit Plus 4x8 in 1 x Per Week/30 Days Discharge Instructions: Apply over primary dressing as directed. Secured With: Elastic Bandage 4 inch (ACE bandage) 1 x Per Week/30 Days Discharge Instructions: Secure with ACE bandage as directed. Com pression Wrap: Kerlix Roll 4.5x3.1 (in/yd) 1 x Per Week/30 Days Discharge  Instructions: Apply Kerlix and Coban compression as directed. 01/24/2022: Her wounds are unchanged. She continues to build up slough and the wounds on her dorsal right foot are still exquisitely tender. I used a curette to debride slough from all of the wound surfaces except where she would not permit it due to pain. I am going to try Iodosorb to see if we can get further chemical debridement. In the meantime, I will continue to communicate with a wound care provider in [place] who has extensive experience managing sickle cell wounds. I am hopeful that some solution will be arrived upon. Electronic Signature(s) Signed: 01/24/2022 10:05:40 AM By: Fredirick Maudlin MD FACS Entered By: Fredirick Maudlin on 01/24/2022 10:05:40 -------------------------------------------------------------------------------- HxROS Details Patient Name: Date of Service: [name], [name] 01/24/2022 9:30 A M Medical Record Number: [id number] Patient Account Number: [id number] Date of Birth/Sex: Treating RN: May 06, 1971 (51 y.o. F) Primary Care Provider: [name] Other Clinician: Referring Provider: Treating Provider/Extender: Elyse Jarvis Weeks in Treatment: 10 Information Obtained From Patient Constitutional Symptoms (General Health) Medical History: Past Medical History Notes: H/O miscarriage Eyes Medical History: Negative for: Cataracts; Glaucoma; Optic Neuritis Ear/Nose/Mouth/Throat Medical History: Negative for: Chronic sinus problems/congestion; Middle ear problems Hematologic/Lymphatic Medical History: Positive for: Anemia; Sickle Cell Disease Negative for: Hemophilia; Human Immunodeficiency Virus; Lymphedema Respiratory Medical History: Negative for: Aspiration; Asthma; Chronic Obstructive Pulmonary Disease (COPD); Pneumothorax; Sleep Apnea; Tuberculosis Cardiovascular Medical History: Negative for: Angina; Arrhythmia; Congestive Heart Failure; Coronary Artery Disease;  Deep Vein Thrombosis; Hypertension; Hypotension; Myocardial Infarction; Peripheral Arterial Disease; Peripheral Venous Disease; Phlebitis; Vasculitis Past Medical History Notes: bradycardia Gastrointestinal Medical History: Negative for: Cirrhosis ; Colitis; Crohns; Hepatitis A; Hepatitis B; Hepatitis C Past Medical History Notes: cholilithiasis Endocrine Medical History: Negative for: Type I Diabetes; Type II Diabetes Genitourinary Medical History: Negative for: End Stage Renal Disease Immunological Medical History: Negative for: Lupus Erythematosus; Raynauds; Scleroderma Integumentary (Skin) Medical History: Negative for: History of Burn Musculoskeletal Medical History: Negative for: Gout; Rheumatoid Arthritis; Osteoarthritis; Osteomyelitis Neurologic Medical History: Positive for: Neuropathy - right foot intermittant Negative for: Dementia; Quadriplegia; Paraplegia; Seizure Disorder Oncologic Medical History: Negative for: Received Chemotherapy; Received Radiation Psychiatric Medical History: Negative for: Anorexia/bulimia; Confinement Anxiety Immunizations Pneumococcal Vaccine: Received Pneumococcal Vaccination: No Implantable Devices None Hospitalization / Surgery History Type of Hospitalization/Surgery c section x2 left breast lumpectomy iandD right ankle with theraskin Family and Social History Cancer: No; Diabetes: Yes - Mother; Heart Disease: No; Hereditary Spherocytosis: No; Hypertension: No; Kidney Disease: No; Lung Disease: Yes - Mother; Seizures: No; Stroke: No; Thyroid Problems: No; Tuberculosis: No; Never smoker; Marital Status - Married; Alcohol Use: Never; Drug Use: No History; Caffeine Use: Daily; Financial Concerns: No; Food, Clothing or Shelter Needs: No; Support System Lacking: No; Transportation Concerns: No Electronic Signature(s) Signed: 01/24/2022 10:48:39 AM By: Fredirick Maudlin MD FACS Entered By: Fredirick Maudlin on 01/24/2022  10:03:40 --------------------------------------------------------------------------------  SuperBill Details Patient Name: Date of Service: [name] 01/24/2022 Medical Record Number: [id number] Patient Account Number: [id number] Date of Birth/Sex: Treating RN: [birth year] (51 y.o. 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 Facility Procedures CPT4 Code: [id number] Description: [phone] - DEBRIDE WOUND 1ST 20 SQ CM OR < ICD-10 Diagnosis Description L97.818 Non-pressure chronic ulcer of other part of right lower leg with other specified s Modifier: everity Quantity: 1 CPT4 Code: [id number] Description: [id number] - DEBRIDE WOUND EA ADDL 20 SQ CM ICD-10 Diagnosis Description L97.818 Non-pressure chronic ulcer of other part of right lower leg with other specified s Modifier: everity Quantity: 3 Physician Procedures : CPT4 Code Description Modifier [id number] - WC PHYS LEVEL 3 - EST PT 25 ICD-10 Diagnosis Description L97.818 Non-pressure chronic ulcer of other part of right lower leg with other specified severity 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

## 2022-01-31 ENCOUNTER — Encounter (HOSPITAL_BASED_OUTPATIENT_CLINIC_OR_DEPARTMENT_OTHER): Payer: Medicaid Other | Admitting: General Surgery

## 2022-01-31 DIAGNOSIS — L97818 Non-pressure chronic ulcer of other part of right lower leg with other specified severity: Secondary | ICD-10-CM | POA: Diagnosis not present

## 2022-01-31 NOTE — Progress Notes (Signed)
KISMET, FACEMIRE ([id number]) Visit Report for 01/31/2022 Chief Complaint Document Details Patient Name: Date of Service: [name] 01/31/2022 12:30 PM Medical Record Number: [id number] Patient Account Number: [id number] Date of Birth/Sex: Treating RN: 1971/04/10 (51 y.o. F) Primary Care Provider: [name] Other Clinician: Referring Provider: Treating Provider/Extender: Elyse Jarvis Weeks in Treatment: 30 Information Obtained from: Patient Chief Complaint the patient is here for evaluation of her bilateral lower extremity sickle cell ulcers 04/17/2021; patient comes in for substantial wounds on the right and left lower leg Electronic Signature(s) Signed: 01/31/2022 1:33:10 PM By: Fredirick Maudlin MD FACS Entered By: Fredirick Maudlin on 01/31/2022 13:33:10 -------------------------------------------------------------------------------- Debridement Details Patient Name: Date of Service: [name], [name] 01/31/2022 12:30 PM Medical Record Number: [id number] Patient Account Number: [id number] Date of Birth/Sex: Treating RN: 1971/02/28 (51 y.o. [name] Primary Care Provider: [name] Other Clinician: Referring Provider: Treating Provider/Extender: Elyse Jarvis Weeks in Treatment: 41 Debridement Performed for Assessment: Wound #21 Right,Medial Ankle Performed By: Physician Fredirick Maudlin, MD Debridement Type: Debridement Severity of Tissue Pre Debridement: Fat layer exposed Level of Consciousness (Pre-procedure): Awake and Alert Pre-procedure Verification/Time Out Yes - 13:15 Taken: Start Time: 13:15 Pain Control: Lidocaine 4% T opical Solution T Area Debrided (L x W): otal 5.7 (cm) x 2.3 (cm) = 13.11 (cm) Tissue and other material debrided: Non-Viable, Slough, Slough Level: Non-Viable Tissue Debridement Description: Selective/Open Wound Instrument: Curette Bleeding: Minimum Hemostasis Achieved:  Pressure End Time: 13:16 Procedural Pain: 0 Post Procedural Pain: 0 Response to Treatment: Procedure was tolerated well Level of Consciousness (Post- Awake and Alert procedure): Post Debridement Measurements of Total Wound Length: (cm) 5.7 Width: (cm) 2.3 Depth: (cm) 0.1 Volume: (cm) 1.03 Character of Wound/Ulcer Post Debridement: Improved Severity of Tissue Post Debridement: Fat layer exposed Post Procedure Diagnosis Same as Pre-procedure Notes Scribed for Dr Celine Ahr by J.[name] Electronic Signature(s) Signed: 01/31/2022 2:07:58 PM By: Fredirick Maudlin MD FACS Signed: 01/31/2022 5:00:03 PM By: Dellie Catholic RN Entered By: Dellie Catholic on 01/31/2022 13:18:42 -------------------------------------------------------------------------------- Debridement Details Patient Name: Date of Service: [name], [name] 01/31/2022 12:30 PM Medical Record Number: [id number] Patient Account Number: [id number] Date of Birth/Sex: Treating RN: January 05, 1971 (51 y.o. [name] Primary Care Provider: [name] Other Clinician: Referring Provider: Treating Provider/Extender: Elyse Jarvis Weeks in Treatment: 41 Debridement Performed for Assessment: Wound #17 Right,Lateral Lower Leg Performed By: Physician Fredirick Maudlin, MD Debridement Type: Debridement Level of Consciousness (Pre-procedure): Awake and Alert Pre-procedure Verification/Time Out Yes - 13:15 Taken: Start Time: 13:15 Pain Control: Lidocaine 4% T opical Solution T Area Debrided (L x W): otal 6 (cm) x 4.8 (cm) = 28.8 (cm) Tissue and other material debrided: Non-Viable, Slough, Slough Level: Non-Viable Tissue Debridement Description: Selective/Open Wound Instrument: Curette Bleeding: Minimum Hemostasis Achieved: Pressure End Time: 13:16 Procedural Pain: 0 Post Procedural Pain: 0 Response to Treatment: Procedure was tolerated well Level of Consciousness (Post- Awake and  Alert procedure): Post Debridement Measurements of Total Wound Length: (cm) 6 Width: (cm) 4.8 Depth: (cm) 0.1 Volume: (cm) 2.262 Character of Wound/Ulcer Post Debridement: Improved Post Procedure Diagnosis Same as Pre-procedure Notes Scribed for Dr. Celine Ahr by J.[name] Electronic Signature(s) Signed: 01/31/2022 2:07:58 PM By: Fredirick Maudlin MD FACS Signed: 01/31/2022 5:00:03 PM By: Dellie Catholic RN Entered By: Dellie Catholic on 01/31/2022 13:20:26 -------------------------------------------------------------------------------- HPI Details Patient Name: Date of Service: [name], [name] 01/31/2022 12:30 PM Medical Record Number: [id number] Patient Account Number: [id number] Date of Birth/Sex: Treating RN:  June 21, 1970 (51 y.o. F) Primary Care Provider: [name] Other Clinician: Referring Provider: Treating Provider/Extender: Elyse Jarvis Weeks in Treatment: 11 History of Present Illness Location: medial and lateral ankle region on the right and left medial malleolus Quality: Patient reports experiencing a shooting pain to affected area(s). Severity: Patient states wound(s) are getting worse. Duration: right lower extremity bimalleolar ulcers have been present for approximately 2 years; the rright meedial malleolus ulcer has been there proximally 6 months Timing: Pain in wound is constant (hurts all the time) Context: The wound would happen gradually ssociated Signs and Symptoms: Patient reports having increase discharge. A HPI Description: 51 year old patient with a history of sickle cell anemia who was last seen by me with ulceration of the right lower extremity above the ankle and was referred to Dr. Leland Johns for a surgical debridement as I was unable to do anything in the office due to excruciating pain. At that stage she was referred from the plastic surgery service to dermatology who treated her for a skin infection with doxycycline and then  Levaquin and a local antibiotic ointment. I understand the patient has since developed ulceration on the left ankle both medial and lateral and was now referred back to the wound center as dermatology has finished the management. I do not have any notes from the dermatology department Old notes: 51 year old patient with a history of sickle cell anemia, pain bilateral lower extremities, right lower extremity ulcer and has a history of receiving a skin graft( Theraskin) several months ago. She has been visiting the wound center [facility] and was seen by Dr. Dellia Nims and Dr. Leland Johns. after prolonged conservator therapy between July 2016 and January 2017. She had been seen by the plastic surgeon and taken to the OR for debridement and application of Theraskin. She had 3 applications of Theraskin and was then treated with collagen. Prior to that she had a history of similar problems in 2014 and was treated conservatively. Had a reflux study done for the right lower extremity in August 2016 without reflux or DVT . Past medical history significant for sickle cell disease, anemia, leg ulcers, cholelithiasis,and has never been a smoker. Once the patient was discharged on the wound center she says within 2 or 3 weeks the problems recurred and she has been treating it conservatively. since I saw her 3 weeks ago at [facility] she has been unable to get her dressing material but has completed a course of doxycycline. 6/7/ 2017 -- lower extremity venous duplex reflux evaluation was done No evidence of SVT or DVT in the RLL. No venous incompetence in the RLL. No further vascular workup is indicated at this time. She was seen by Dr. [name], on 10/04/2015. She agreed with the plan of taking her to the OR for debridement and application of theraskin and would also take biopsies to rule out pyoderma gangrenosum. Follow-up note dated May 31 received and she was status post application of Theraskin to multiple  ulcers around the right ankle. Pathology did not show evidence of malignancy or pyoderma gangrenosum. She would continue to see as in the wound clinic for further care and see Dr. Leland Johns as needed. The patient brought the biopsy report and it was consistent with stasis ulcer no evidence of malignancy and the comment was that there was some adjacent neovascularization, fibrosis and patchy perivascular chronic inflammation. 11/15/2015 -- today we applied her first application of Theraskin 11/30/15; TheraSkin #2 12/13/2015 -- she is having a lot of pain  locally and is here for possible application of a theraskin today. 01/16/2016 -- the patient has significant pain and has noticed despite in spite of all local care and oral pain medication. It is impossible to debride her in the office. 02/06/2016 -- I do not see any notes from Dr. Iran Planas( the patient has not made a call to the office know as she heard from them) and the only visit to recently was with her PCP Dr. Danella Penton -- I saw her on 01/16/2016 and prescribed 90 tablets of oxycodone 10 mg and did lab work and screening for HIV. the HIV was negative and hemoglobin was 6.3 with a WBC count of 14.9 and hematocrit of 17.8 with platelets of 561. 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 The bimalleolar ulcers to the right lower extremity have been present for approximately 2 years, the left medial malleolus ulcer has been present approximately 6 months. She has followed with Dr.Thimmappa in the past and has had a total of 3 applications of Theraskin (01/2015, 09/2015, 06/17/16). She has also followed with Dr. Con Memos here in the clinic and has received 2 applications of TheraSkin (11/10/15, 11/30/15). The patient does experience chronic, and is not amenable to debridement. She had a sickle cell crisis in December 2017, prior to that  has been several years. She is not currently on any antibiotic therapy and has not been treated with any recently. 07/17/2016 -- was seen by Dr. Iran Planas of plastic surgery who saw her 2 weeks postop application of Theraskin #3. She had removed her dressing and asked her to apply silver alginate on alternate days and follow-up back with the wound center. Future debridements and application of skin substitute would have to be done in the hospital due to her high risk for anesthesia. READMISSION 04/17/2021 Patient is now a 51 year old woman that we have had in this clinic for a prolonged period of time and 2016-2017 and then again for 2 visits in February 2018. At that point she had wounds on the right lower leg predominantly medial. She had also been seen by plastic surgery Dr. Leland Johns who I believe took her to the OR for operative debridement and application of TheraSkin in 2017. After she left our clinic she was followed for a very prolonged period of time in the wound care center in [facility] who then referred her ultimately to [facility] where she was seen by Dr. Vernona Rieger. Again taken her to the OR for skin grafting which apparently did not take. She had multiple other attempts at dressings although I have not really looked over all of these notes in great detail. She has not been seen in a wound care center in about a year. She states over the last year in addition to her right lower leg she has developed wounds on the left lower leg quite extensive. She is using Xeroform to all of these wounds without really any improvement. She also has Medicaid which does not cover wound products. The patient has had vascular work-ups in the past including most recently on 03/28/2021 showing biphasic waveforms on the right triphasic at the PTA and biphasic at the dorsalis pedis on the left. She was unable to tolerate any degree of compression to do ABIs. Unfortunately TBI's were also not done. She had venous reflux  studies done in 2017. This did not show any evidence of a DVT or SVT and  no venous incompetence was noted in the right leg at the time this was the only side with the wound As noted I did not look all over her old records. She apparently had a course of HBO and [facility] although I am not sure what the indication would have been. In any case she developed seizures and terminated treatment earlier. She is generally much more disabled than when we last saw her in clinic. She can no longer walk pretty much wheelchair-bound because predominantly of pain in the left hip. 04/24/2021; the patient tolerated the wraps we put on. We used Santyl and Hydrofera Blue under compression. I brought her back for a nurse visit for a change in dressing. With Medicaid we will have a hard time getting anything paid for and hence the need for compression. She arrives in clinic with all the wounds looking somewhat better in terms of surface 12/20; circumferential wound on the right from the lateral to the medial. She has open areas on the left medial and left lateral x2 on all of this with the same surface. This does not look completely healthy although she does have some epithelialization. She is not complaining of a lot of pain which is unusual for her sickle ulcers. I have not looked over her extensive records from [facility]. She had recent arterial studies and has a history of venous reflux studies I will need to look these over although I do not believe she has significant arterial disease 2023 05/22/2021; patient's wound areas measure slightly smaller. Still a lot of drainage coming from the right we have been using Hydrofera Blue and Santyl with some improvement in the wound surfaces. She tells me she will be getting transfused later in the week for her underlying sickle cell anemia I have looked over her recent arterial studies which were done in the fall. This was in November and showed biphasic and triphasic waveforms  but she could not tolerate ABIs because of pressure and unfortunately TBI's were not done. She has not had recent venous reflux studies that I can see 1/10; not much change about the same surface area. This has a yellowish surface to it very gritty. We have been using Santyl and Hydrofera Blue for a prolonged period. Culture I did last week showed methicillin sensitive staph aureus "rare". Our intake nurse reports greenish drainage which may be the Hydrofera Blue itself 1/17; wounds are continue to measure smaller although I am not sure about the accuracy here. Especially the areas on the right are covered in what looks to be a nonviable surface although she does have some epithelialization. Similarly she has areas on the left medial and left lateral ankle area which appear to have a better surface and perhaps are slightly smaller. We have been using Santyl and Hydrofera Blue. She cannot tolerate mechanical debridements She went for her reflux studies which showed significant reflux at the greater saphenous vein at the saphenofemoral junction as well as the greater saphenous vein in the proximal calf on the left she had reflux in the thigh and the common femoral vein and supra vein Fishel vein reflux in the greater saphenous vein. I will have vein and vascular look at this. My thoughts have been that these are likely sickle wounds. I looked through her old records from [facility] wound care center and then when she graduated to [facility] wound care center where she saw Dr. Zigmund Daniel and Dr. Vernona Rieger. Although I can see she had reflux studies  done I do not see that she actually saw a vein and vascular. I went over the fact that she had operative debridements and actual skin grafting that did not take. I do not think these wounds have ever really progressed towards healing 1/31;Substantial wounds on the right ankle area. Hyper granulated very gritty adherent debris on the surface. 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 On the left she has no DVT or SVT deep vein reflux in the common femoral vein. Superficial vein reflux in the greater saphenous vein on although the vein diameter is not really all that large. I do not think there is anything that can be done with these although I am going to send her for consultation to vein and vascular. 2/7; Wound exam; substantial wound area on the right posterior ankle area and areas on the left medial ankle and left lateral ankle. I was able to debride the left medial ankle last week fairly aggressively and it is back this week to a completely nonviable surface She will see vascular surgery this Friday and I would like them to review the venous studies and also any comments on her arterial status. If they do not see an issue here I am going to refer her to plastic surgery for an operative debridement perhaps intraoperative ACell or Integra. Eventually she will require a deep tissue culture again 2/14; substantial wound area on the right posterior ankle, medial ankle. We have been using silver alginate The patient was seen by vein and vascular she had both venous reflux studies and arterial studies. 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 She had no evidence of superficial venous thrombosis. She did have superficial vein reflux at the saphenofemoral junction and the greater saphenous vein. On the left no evidence of a DVT no evidence of superficial venous throat thrombosis she did have deep vein reflux in the common femoral  vein and superficial vein reflux in the greater saphenous vein but these were not felt to be amenable to ablation. In terms of arterial studies she had triphasic and wife biphasic wave waveforms bilaterally not felt to have a significant arterial issue. I do not get the feeling that they felt that any part of her nonhealing wounds were related to either arterial or venous issues. They did note that she had venous reflux at the right at the [facility] and [facility]. And also on the left there were reflux in the deep system at the common femoral vein and greater saphenous vein in the proximal thigh. Nothing amenable to ablation. 2/20; she is making some decent progress on the right where there is nice skin between the 2 open areas on the right ankle. The surfaces here do not look viable yet there is some surrounding epithelialization. She still has a small area on the left medial ankle area. Hyper-granulated Jody's away always 2/28 patient has an appointment with plastic surgery on 3/8. We will see her back on 3/9. She may have to call us to get the area redressed. We've been  using Santyl under silver alginate. We made a nice improvement on the left medial ankle. The larger wounds on the right also looks somewhat better in terms of epithelialization although I think they could benefit from an aggressive debridement if plastic surgery would be willing to do that. Perhaps placement of Integra or a cell 07/26/2021: She saw Dr. Claudia Desanctis yesterday. He raised the question as to whether or not this might be pyoderma and wanted to wait until that question was answered by dermatology before proceeding with any sort of operative debridement. We have continue to use Santyl under silver alginate with Kerlix and Coban wraps. Overall, her wounds appear to be continuing to contract and epithelialize, with some granulation tissue present. There continues to be some slough on all wound surfaces. 08/09/2021: She has not been able to get an  appointment with dermatology because apparently the offices in [place] do not accept Medicaid. She is looking into whether or not she can be seen at the main [facility] dermatology clinic. This is necessary because plastic surgery is concerned that her wounds might represent pyoderma and they did not want to do any procedure until that was clarified. We have been using Santyl under silver alginate with Kerlix and Coban wraps. Today, there was a greater amount of drainage on her dressings with a slight green discoloration and significant odor. Despite this, her wounds continue to contract and epithelialize. There is pale granulation tissue present and actually, on the left medial ankle, the granulation tissue is a bit hypertrophic. 08/16/2021: Last week, I took a culture and this grew back rare methicillin-resistant Staph aureus and rare corynebacterium. The MRSA was sensitive to gentamicin which we began applying topically on an empiric basis. This week, her wounds are a bit smaller and the drainage and odor are less. Her primary care provider is working on assisting the patient with a dermatology evaluation. She has been in silver alginate over the gentamicin that was started last week along with Kerlix and Coban wraps. 08/23/2021: Because she has Medicaid, we have been unable to get her into see any dermatologist in the [place] to rule out pyoderma gangrenosum, which was a requirement from plastic surgery prior to any sort of debridement and grafting. Despite this, however, all of her wounds continue to get smaller. The wound on her left medial ankle is nearly closed. There is no odor from the wounds, although she still accumulates a modest amount of drainage on her dressings. 08/30/2021: The lateral right ankle wound and the medial left ankle wound are a bit smaller today. The medial right ankle wound is about the same size. They are less tender. We have still been unable to get  her into dermatology. 09/06/2021: All of the wounds are about the same size today. She continues to endorse minimal pain. I communicated with Dr. Claudia Desanctis in plastic surgery regarding our issues getting a dermatology appointment; he was out of town but indicated that he would look into perhaps performing the biopsy in his office and will have his office contact her. 09/14/2021: The patient has an appointment in dermatology, but it is not until October. Her wounds are roughly the same; she continues to have very thick purulent-looking drainage on her dressings. 09/20/2021: The left medial wound is nearly closed and just has a bit of accumulated eschar on the surface. The right medial and lateral ankle wounds are perhaps a little bit smaller. They continue to have a very pale surface with accumulation  of thin slough. 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 I did not think [employer] was indicated based on this. She is getting topical mupirocin with Prisma silver collagen. 10/04/2021: The patient was not seen in clinic last week due to childcare coverage issues. In the interim, the left medial leg wound has closed. The right sided leg wounds are smaller. There is more granulation tissue coming through, particularly on the lateral wound. The surface remains somewhat gritty. We have been applying topical mupirocin and Prisma silver collagen. 10/11/2021: The left medial leg wound remains closed. She does complain of some anesthetic sensation to the area. Both of the right-sided leg wounds are smaller but still have accumulated slough. 10/18/2021: Both right-sided leg wounds are minimally smaller this week. She still continues to accumulate slough and has thick drainage on her dressings. 10/23/2021: Both wounds continue to contract. There is still slough buildup. She has been approved for a keratin-based skin substitute trial product but it will not be available until next week. 10/30/2021: The  wounds are about the same to perhaps slightly smaller. There is still continued slough buildup. Unfortunately, the rep for the keratin based product did not show up today and did not answer his phone when called. 11/08/2021: The wounds are little bit smaller today. She continues to have thick drainage but the surfaces are relatively clean with just a little bit of slough accumulation. She reported to me today that she is unable to completely flex her left ankle and on examination it seems this is potentially related to scar tissue from her wounds. We do have the Pro[employer] trial product available for her today. 11/15/2021: Both wounds are smaller today. There is some slough accumulation on the surfaces, but the medial wound, in particular looks like it is filling in and is less deep. She did hear from physical therapy and she is going to start working with them on July 11. She is here for her second application of the trial skin substitute, ProgenaMatrix. 11/22/2021: Both wounds continue to contract, the medial more dramatically than the lateral. Both wounds have a layer of slough on the surface, but underneath this, the gritty fibrous tissue has a little bit more of a pink cast to it rather than being as pale as it has been. 11/29/2021: The wounds are roughly the same size this week, perhaps a millimeter or 2 smaller. The medial wound has filled in and is nearly flush with the surrounding skin surface. She continues to have a lot of slough accumulation on both surfaces. 12/06/2021: No significant change to her wounds, but she has a new opening on her dorsal foot, just distal to the right lateral ankle wound. The area on her left medial ankle that reopened looks a little bit larger today. She has quite a bit of pain associated with the new wound. 12/12/2021: Her wounds look about the same but the new opening on her right lateral dorsal foot is a little bit bigger. She continues to have a fair amount of  pain with this wound. 12/26/2021: The left medial ankle wound is tiny and superficial. She has 2 areas of crusting on her left lateral ankle, however, that appear to be threatening to open again. Her right medial ankle wound is a little bit smaller today but still continues to accumulate thick rubbery slough. The new dorsal foot wound is exquisitely painful but there is no odor or purulent drainage. No erythema or induration. The right lateral ankle  wound looks about the same today, again with thick rubbery slough. 01/03/2022: The left medial ankle wound has closed again. Both right ankle wounds appear to be about the same size with thick rubbery slough. The dorsal foot wound on the right continues to be quite painful and she stated that she did not want any debridement of that site today. 01/10/2022: No real change to any of her wounds. She continues to accumulate thick slough. The dorsal foot wound has merged with the lateral malleolar wound. She is experiencing significant pain in the dorsal foot portion of the ulcer. 01/16/2022: Absolutely no change or progress in her wounds. 01/24/2022: Her wounds are unchanged. She continues to build up slough and the wounds on her dorsal right foot are still exquisitely tender. 01/31/2022: The wounds actually measure a little bit narrower today. They still have thick slough on the surface but the underlying tissue seems a little less fibrotic. We changed to Iodosorb last week. Electronic Signature(s) Signed: 01/31/2022 1:33:48 PM By: Fredirick Maudlin MD FACS Entered By: Fredirick Maudlin on 01/31/2022 13:33:48 -------------------------------------------------------------------------------- Physical Exam Details Patient Name: Date of Service: [name], [name] 01/31/2022 12:30 PM Medical Record Number: [id number] Patient Account Number: [id number] Date of Birth/Sex: Treating RN: [birth year] (51 y.o. F) Primary Care Provider: [name] Other  Clinician: Referring Provider: Treating Provider/Extender: Elyse Jarvis Weeks in Treatment: 23 Constitutional Slightly hypertensive. . . . No acute distress.Marland Kitchen Respiratory Normal work of breathing on room air.. Notes 01/31/2022: The wounds actually measure a little bit narrower today. They still have thick slough on the surface but the underlying tissue seems a little less fibrotic. Electronic Signature(s) Signed: 01/31/2022 1:34:19 PM By: Fredirick Maudlin MD FACS Entered By: Fredirick Maudlin on 01/31/2022 13:34:19 -------------------------------------------------------------------------------- Physician Orders Details Patient Name: Date of Service: [name], [name] 01/31/2022 12:30 PM Medical Record Number: [id number] Patient Account Number: [id number] Date of Birth/Sex: Treating RN: 10-Jun-1970 (51 y.o. [name] Primary Care Provider: [name] Other Clinician: Referring Provider: Treating Provider/Extender: Elyse Jarvis Weeks in Treatment: 63 Verbal / Phone Orders: No Diagnosis Coding ICD-10 Coding Code Description L97.818 Non-pressure chronic ulcer of other part of right lower leg with other specified severity 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 Follow-up Appointments ppointment in 1 week. - Dr. Celine Ahr Room 3 Return A Thursday 02/07/22 at 12:30pm Anesthetic (In clinic) Topical Lidocaine 5% applied to wound bed (In clinic) Topical Lidocaine 4% applied to wound bed - In Clinic Bathing/ Shower/ Hygiene May shower with protection but do not get wound dressing(s) wet. - Can get cast protector bags at [employer] or [facility] Edema Control - Lymphedema / SCD / Other Elevate legs to the level of the heart or above for 30 minutes daily and/or when sitting, a frequency of: - throughout the day Avoid standing for long periods of time. Exercise regularly Additional Orders / Instructions Follow Nutritious Diet Wound  Treatment Wound #17 - Lower Leg Wound Laterality: Right, Lateral Cleanser: Soap and Water Every Other Day/30 Days Discharge Instructions: May shower and wash wound with dial antibacterial soap and water prior to dressing change. Cleanser: Wound Cleanser Every Other Day/30 Days Discharge Instructions: Cleanse the wound with wound cleanser prior to applying a clean dressing using gauze sponges, not tissue or cotton balls. Peri-Wound Care: Triamcinolone 15 (g) Every Other Day/30 Days Discharge Instructions: Use triamcinolone 15 (g) as directed Peri-Wound Care: Sween Lotion (Moisturizing lotion) Every Other Day/30 Days Discharge Instructions: Apply moisturizing lotion as directed Secondary Dressing: ABD  Pad, 5x9 Every Other Day/30 Days Discharge Instructions: Apply over primary dressing as directed. Secondary Dressing: Zetuvit Plus 4x8 in Every Other Day/30 Days Discharge Instructions: Apply over primary dressing as directed. Secured With: Elastic Bandage 4 inch (ACE bandage) Every Other Day/30 Days Discharge Instructions: Secure with ACE bandage as directed. Compression Wrap: Kerlix Roll 4.5x3.1 (in/yd) Every Other Day/30 Days Discharge Instructions: Apply Kerlix and Coban compression as directed. Wound #21 - Ankle Wound Laterality: Right, Medial Cleanser: Soap and Water Every Other Day/30 Days Discharge Instructions: May shower and wash wound with dial antibacterial soap and water prior to dressing change. Cleanser: Wound Cleanser Every Other Day/30 Days Discharge Instructions: Cleanse the wound with wound cleanser prior to applying a clean dressing using gauze sponges, not tissue or cotton balls. Peri-Wound Care: Triamcinolone 15 (g) Every Other Day/30 Days Discharge Instructions: Use triamcinolone 15 (g) as directed Peri-Wound Care: Sween Lotion (Moisturizing lotion) Every Other Day/30 Days Discharge Instructions: Apply moisturizing lotion as directed Secondary Dressing: ABD Pad, 5x9 Every  Other Day/30 Days Discharge Instructions: Apply over primary dressing as directed. Secondary Dressing: Zetuvit Plus 4x8 in Every Other Day/30 Days Discharge Instructions: Apply over primary dressing as directed. Secured With: Elastic Bandage 4 inch (ACE bandage) Every Other Day/30 Days Discharge Instructions: Secure with ACE bandage as directed. Compression Wrap: Kerlix Roll 4.5x3.1 (in/yd) Every Other Day/30 Days Discharge Instructions: Apply Kerlix and Coban compression as directed. Electronic Signature(s) Signed: 01/31/2022 2:07:58 PM By: Fredirick Maudlin MD FACS Entered By: Fredirick Maudlin on 01/31/2022 13:34:39 -------------------------------------------------------------------------------- Problem List Details Patient Name: Date of Service: [name], [name] 01/31/2022 12:30 PM Medical Record Number: [id number] Patient Account Number: [id number] Date of Birth/Sex: Treating RN: 1970-08-31 (51 y.o. F) Primary Care Provider: [name] Other Clinician: Referring Provider: Treating Provider/Extender: Elyse Jarvis Weeks in Treatment: 72 Active Problems ICD-10 Encounter Code Description Active Date MDM Diagnosis L97.818 Non-pressure chronic ulcer of other part of right lower leg with other specified 04/17/2021 No Yes severity 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 04/17/2021 No Yes Inactive Problems ICD-10 Code Description Active Date Inactive Date L97.828 Non-pressure chronic ulcer of other part of left lower leg with other specified severity 04/17/2021 04/17/2021 Resolved Problems Electronic Signature(s) Signed: 01/31/2022 1:32:01 PM By: Fredirick Maudlin MD FACS Entered By: Fredirick Maudlin on 01/31/2022 13:32:00 -------------------------------------------------------------------------------- Progress Note Details Patient Name: Date of Service: [name], [name] 01/31/2022 12:30 PM Medical Record Number: [id number] Patient Account Number:  [id number] Date of Birth/Sex: Treating RN: [birth year] (51 y.o. F) Primary Care Provider: [name] Other Clinician: Referring Provider: Treating Provider/Extender: Elyse Jarvis Weeks in Treatment: 85 Subjective Chief Complaint Information obtained from Patient the patient is here for evaluation of her bilateral lower extremity sickle cell ulcers 04/17/2021; patient comes in for substantial wounds on the right and left lower leg History of Present Illness (HPI) The following HPI elements were documented for the patient's wound: Location: medial and lateral ankle region on the right and left medial malleolus Quality: Patient reports experiencing a shooting pain to affected area(s). Severity: Patient states wound(s) are getting worse. Duration: right lower extremity bimalleolar ulcers have been present for approximately 2 years; the rright meedial malleolus ulcer has been there proximally 6 months Timing: Pain in wound is constant (hurts all the time) Context: The wound would happen gradually Associated Signs and Symptoms: Patient reports having increase discharge. 51 year old patient with a history of sickle cell anemia who was last seen by me with ulceration of the right lower extremity  above the ankle and was referred to Dr. Leland Johns for a surgical debridement as I was unable to do anything in the office due to excruciating pain. At that stage she was referred from the plastic surgery service to dermatology who treated her for a skin infection with doxycycline and then Levaquin and a local antibiotic ointment. I understand the patient has since developed ulceration on the left ankle both medial and lateral and was now referred back to the wound center as dermatology has finished the management. I do not have any notes from the dermatology department Old notes: 51 year old patient with a history of sickle cell anemia, pain bilateral lower extremities, right lower  extremity ulcer and has a history of receiving a skin graft( Theraskin) several months ago. She has been visiting the wound center [facility] and was seen by Dr. Dellia Nims and Dr. Leland Johns. after prolonged conservator therapy between July 2016 and January 2017. She had been seen by the plastic surgeon and taken to the OR for debridement and application of Theraskin. She had 3 applications of Theraskin and was then treated with collagen. Prior to that she had a history of similar problems in 2014 and was treated conservatively. Had a reflux study done for the right lower extremity in August 2016 without reflux or DVT . Past medical history significant for sickle cell disease, anemia, leg ulcers, cholelithiasis,and has never been a smoker. Once the patient was discharged on the wound center she says within 2 or 3 weeks the problems recurred and she has been treating it conservatively. since I saw her 3 weeks ago at [facility] she has been unable to get her dressing material but has completed a course of doxycycline. 6/7/ 2017 -- lower extremity venous duplex reflux evaluation was done oo No evidence of SVT or DVT in the RLL. No venous incompetence in the RLL. No further vascular workup is indicated at this time. She was seen by Dr. [name], on 10/04/2015. She agreed with the plan of taking her to the OR for debridement and application of theraskin and would also take biopsies to rule out pyoderma gangrenosum. Follow-up note dated May 31 received and she was status post application of Theraskin to multiple ulcers around the right ankle. Pathology did not show evidence of malignancy or pyoderma gangrenosum. She would continue to see as in the wound clinic for further care and see Dr. Leland Johns as needed. The patient brought the biopsy report and it was consistent with stasis ulcer no evidence of malignancy and the comment was that there was some adjacent neovascularization, fibrosis and patchy  perivascular chronic inflammation. 11/15/2015 -- today we applied her first application of Theraskin 11/30/15; TheraSkin #2 12/13/2015 -- she is having a lot of pain locally and is here for possible application of a theraskin today. 01/16/2016 -- the patient has significant pain and has noticed despite in spite of all local care and oral pain medication. It is impossible to debride her in the office. 02/06/2016 -- I do not see any notes from Dr. Iran Planas( the patient has not made a call to the office know as she heard from them) and the only visit to recently was with her PCP Dr. Danella Penton -- I saw her on 01/16/2016 and prescribed 90 tablets of oxycodone 10 mg and did lab work and screening for HIV. the HIV was negative and hemoglobin was 6.3 with a WBC count of 14.9 and hematocrit of 17.8 with platelets of 561. reticulocyte count was 15.5% READMISSION:  07/10/2016- The patient is here for readmission for bilateral lower extremity ulcers in the presence of sickle cell. The bimalleolar ulcers to the right lower extremity have been present for approximately 2 years, the left medial malleolus ulcer has been present approximately 6 months. She has followed with Dr.Thimmappa in the past and has had a total of 3 applications of Theraskin (01/2015, 09/2015, 06/17/16). She has also followed with Dr. Con Memos here in the clinic and has received 2 applications of TheraSkin (11/10/15, 11/30/15). The patient does experience chronic, and is not amenable to debridement. 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 She is not currently on any antibiotic therapy and has not been treated with any recently. 07/17/2016 -- was seen by Dr. Iran Planas of plastic surgery who saw her 2 weeks postop application of Theraskin #3. She had removed her dressing and asked her to apply silver alginate on alternate days and follow-up back with the wound center. Future debridements and application of  skin substitute would have to be done in the hospital due to her high risk for anesthesia. READMISSION 04/17/2021 Patient is now a 51 year old woman that we have had in this clinic for a prolonged period of time and 2016-2017 and then again for 2 visits in February 2018. At that point she had wounds on the right lower leg predominantly medial. She had also been seen by plastic surgery Dr. Leland Johns who I believe took her to the OR for operative debridement and application of TheraSkin in 2017. After she left our clinic she was followed for a very prolonged period of time in the wound care center in [facility] who then referred her ultimately to [facility] where she was seen by Dr. Vernona Rieger. Again taken her to the OR for skin grafting which apparently did not take. She had multiple other attempts at dressings although I have not really looked over all of these notes in great detail. She has not been seen in a wound care center in about a year. She states over the last year in addition to her right lower leg she has developed wounds on the left lower leg quite extensive. She is using Xeroform to all of these wounds without really any improvement. She also has Medicaid which does not cover wound products. The patient has had vascular work-ups in the past including most recently on 03/28/2021 showing biphasic waveforms on the right triphasic at the PTA and biphasic at the dorsalis pedis on the left. She was unable to tolerate any degree of compression to do ABIs. Unfortunately TBI's were also not done. She had venous reflux studies done in 2017. This did not show any evidence of a DVT or SVT and no venous incompetence was noted in the right leg at the time this was the only side with the wound As noted I did not look all over her old records. She apparently had a course of HBO and [facility] although I am not sure what the indication would have been. In any case she developed seizures and terminated treatment  earlier. She is generally much more disabled than when we last saw her in clinic. She can no longer walk pretty much wheelchair-bound because predominantly of pain in the left hip. 04/24/2021; the patient tolerated the wraps we put on. We used Santyl and Hydrofera Blue under compression. I brought her back for a nurse visit for a change in dressing. With Medicaid we will have a hard time getting  anything paid for and hence the need for compression. She arrives in clinic with all the wounds looking somewhat better in terms of surface 12/20; circumferential wound on the right from the lateral to the medial. She has open areas on the left medial and left lateral x2 on all of this with the same surface. This does not look completely healthy although she does have some epithelialization. She is not complaining of a lot of pain which is unusual for her sickle ulcers. I have not looked over her extensive records from [facility]. She had recent arterial studies and has a history of venous reflux studies I will need to look these over although I do not believe she has significant arterial disease 2023 05/22/2021; patient's wound areas measure slightly smaller. Still a lot of drainage coming from the right we have been using Hydrofera Blue and Santyl with some improvement in the wound surfaces. She tells me she will be getting transfused later in the week for her underlying sickle cell anemia I have looked over her recent arterial studies which were done in the fall. This was in November and showed biphasic and triphasic waveforms but she could not tolerate ABIs because of pressure and unfortunately TBI's were not done. She has not had recent venous reflux studies that I can see 1/10; not much change about the same surface area. This has a yellowish surface to it very gritty. We have been using Santyl and Hydrofera Blue for a prolonged period. Culture I did last week showed methicillin sensitive staph aureus "rare".  Our intake nurse reports greenish drainage which may be the Hydrofera Blue itself 1/17; wounds are continue to measure smaller although I am not sure about the accuracy here. Especially the areas on the right are covered in what looks to be a nonviable surface although she does have some epithelialization. Similarly she has areas on the left medial and left lateral ankle area which appear to have a better surface and perhaps are slightly smaller. We have been using Santyl and Hydrofera Blue. She cannot tolerate mechanical debridements She went for her reflux studies which showed significant reflux at the greater saphenous vein at the saphenofemoral junction as well as the greater saphenous vein in the proximal calf on the left she had reflux in the thigh and the common femoral vein and supra vein Fishel vein reflux in the greater saphenous vein. I will have vein and vascular look at this. My thoughts have been that these are likely sickle wounds. I looked through her old records from [facility] wound care center and then when she graduated to [facility] wound care center where she saw Dr. Zigmund Daniel and Dr. Vernona Rieger. Although I can see she had reflux studies done I do not see that she actually saw a vein and vascular. I went over the fact that she had operative debridements and actual skin grafting that did not take. I do not think these wounds have ever really progressed towards healing 1/31;Substantial wounds on the right ankle area. Hyper granulated very gritty adherent debris on the surface. 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  On the  left she has no DVT or SVT deep vein reflux in the common femoral vein. Superficial vein reflux in the greater saphenous vein on although the vein diameter is not really all that large. I do not think there is anything that can be done with these although I am going to send her for consultation to vein and vascular. 2/7; Wound exam; substantial wound area on the right posterior ankle area and areas on the left medial ankle and left lateral ankle. I was able to debride the left medial ankle last week fairly aggressively and it is back this week to a completely nonviable surface She will see vascular surgery this Friday and I would like them to review the venous studies and also any comments on her arterial status. If they do not see an issue here I am going to refer her to plastic surgery for an operative debridement perhaps intraoperative ACell or Integra. Eventually she will require a deep tissue culture again 2/14; substantial wound area on the right posterior ankle, medial ankle. We have been using silver alginate The patient was seen by vein and vascular she had both venous reflux studies and arterial studies. 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 She had no evidence of superficial venous thrombosis. She did have superficial vein reflux at the saphenofemoral junction and the greater saphenous vein. On the left no evidence of a DVT no evidence of superficial venous throat thrombosis she did have deep vein reflux in the common femoral vein and superficial vein reflux in the greater saphenous vein but these were not felt to be amenable to ablation. In terms of arterial studies she had triphasic and wife biphasic wave waveforms bilaterally not felt to have a significant arterial issue. I do not get the feeling that they felt that any part of her nonhealing wounds were related to either arterial or venous issues. They did  note that she had venous reflux at the right at the [facility] and G[facility]. And also on the left there were reflux in the deep system at the common femoral vein and greater saphenous vein in the proximal thigh. Nothing amenable to ablation. 2/20; she is making some decent progress on the right where there is nice skin between the 2 open areas on the right ankle. The surfaces here do not look viable yet there is some surrounding epithelialization. She still has a small area on the left medial ankle area. Hyper-granulated Jody's away always 2/28 patient has an appointment with plastic surgery on 3/8. We will see her back on 3/9. She may have to call us to get the area redressed. We've been using Santyl under silver alginate. We made a nice improvement on the left medial ankle. The larger wounds on the right also looks somewhat better in terms of epithelialization although I think they could benefit from an aggressive debridement if plastic surgery would be willing to do that. Perhaps placement of Integra or a cell 07/26/2021: She saw Dr. Claudia Desanctis yesterday. He raised the question as to whether or not this might be pyoderma and wanted to wait until that question was answered by dermatology before proceeding with any sort of operative debridement. We have continue to use Santyl under silver alginate with Kerlix and Coban wraps. Overall, her wounds appear to be continuing to contract and epithelialize, with some granulation tissue present. There continues to be some slough on  all wound surfaces. 08/09/2021: She has not been able to get an appointment with dermatology because apparently the offices in [place] do not accept Medicaid. She is looking into whether or not she can be seen at the main [facility] dermatology clinic. This is necessary because plastic surgery is concerned that her wounds might represent pyoderma and they did not want to do any procedure until that was clarified. We have been  using Santyl under silver alginate with Kerlix and Coban wraps. Today, there was a greater amount of drainage on her dressings with a slight green discoloration and significant odor. Despite this, her wounds continue to contract and epithelialize. There is pale granulation tissue present and actually, on the left medial ankle, the granulation tissue is a bit hypertrophic. 08/16/2021: Last week, I took a culture and this grew back rare methicillin-resistant Staph aureus and rare corynebacterium. The MRSA was sensitive to gentamicin which we began applying topically on an empiric basis. This week, her wounds are a bit smaller and the drainage and odor are less. Her primary care provider is working on assisting the patient with a dermatology evaluation. She has been in silver alginate over the gentamicin that was started last week along with Kerlix and Coban wraps. 08/23/2021: Because she has Medicaid, we have been unable to get her into see any dermatologist in the [place] to rule out pyoderma gangrenosum, which was a requirement from plastic surgery prior to any sort of debridement and grafting. Despite this, however, all of her wounds continue to get smaller. The wound on her left medial ankle is nearly closed. There is no odor from the wounds, although she still accumulates a modest amount of drainage on her dressings. 08/30/2021: The lateral right ankle wound and the medial left ankle wound are a bit smaller today. The medial right ankle wound is about the same size. They are less tender. We have still been unable to get her into dermatology. 09/06/2021: All of the wounds are about the same size today. She continues to endorse minimal pain. I communicated with Dr. Claudia Desanctis in plastic surgery regarding our issues getting a dermatology appointment; he was out of town but indicated that he would look into perhaps performing the biopsy in his office and will have his office contact her. 09/14/2021: The patient has  an appointment in dermatology, but it is not until October. Her wounds are roughly the same; she continues to have very thick purulent-looking drainage on her dressings. 09/20/2021: The left medial wound is nearly closed and just has a bit of accumulated eschar on the surface. The right medial and lateral ankle wounds are perhaps a little bit smaller. They continue to have a very pale surface with accumulation of thin slough. 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 I did not think [employer] was indicated based on this. She is getting topical mupirocin with Prisma silver collagen. 10/04/2021: The patient was not seen in clinic last week due to childcare coverage issues. In the interim, the left medial leg wound has closed. The right sided leg wounds are smaller. There is more granulation tissue coming through, particularly on the lateral wound. The surface remains somewhat gritty. We have been applying topical mupirocin and Prisma silver collagen. 10/11/2021: The left medial leg wound remains closed. She does complain of some anesthetic sensation to the area. Both of the right-sided leg wounds are smaller but still have accumulated slough. 10/18/2021: Both right-sided leg wounds are  minimally smaller this week. She still continues to accumulate slough and has thick drainage on her dressings. 10/23/2021: Both wounds continue to contract. There is still slough buildup. She has been approved for a keratin-based skin substitute trial product but it will not be available until next week. 10/30/2021: The wounds are about the same to perhaps slightly smaller. There is still continued slough buildup. Unfortunately, the rep for the keratin based product did not show up today and did not answer his phone when called. 11/08/2021: The wounds are little bit smaller today. She continues to have thick drainage but the surfaces are relatively clean with just a little bit of slough accumulation. She  reported to me today that she is unable to completely flex her left ankle and on examination it seems this is potentially related to scar tissue from her wounds. We do have the ProgenaMatrix trial product available for her today. 11/15/2021: Both wounds are smaller today. There is some slough accumulation on the surfaces, but the medial wound, in particular looks like it is filling in and is less deep. She did hear from physical therapy and she is going to start working with them on July 11. She is here for her second application of the trial skin substitute, ProgenaMatrix. 11/22/2021: Both wounds continue to contract, the medial more dramatically than the lateral. Both wounds have a layer of slough on the surface, but underneath this, the gritty fibrous tissue has a little bit more of a pink cast to it rather than being as pale as it has been. 11/29/2021: The wounds are roughly the same size this week, perhaps a millimeter or 2 smaller. The medial wound has filled in and is nearly flush with the surrounding skin surface. She continues to have a lot of slough accumulation on both surfaces. 12/06/2021: No significant change to her wounds, but she has a new opening on her dorsal foot, just distal to the right lateral ankle wound. The area on her left medial ankle that reopened looks a little bit larger today. She has quite a bit of pain associated with the new wound. 12/12/2021: Her wounds look about the same but the new opening on her right lateral dorsal foot is a little bit bigger. She continues to have a fair amount of pain with this wound. 12/26/2021: The left medial ankle wound is tiny and superficial. She has 2 areas of crusting on her left lateral ankle, however, that appear to be threatening to open again. Her right medial ankle wound is a little bit smaller today but still continues to accumulate thick rubbery slough. The new dorsal foot wound is exquisitely painful but there is no odor or purulent  drainage. No erythema or induration. The right lateral ankle wound looks about the same today, again with thick rubbery slough. 01/03/2022: The left medial ankle wound has closed again. Both right ankle wounds appear to be about the same size with thick rubbery slough. The dorsal foot wound on the right continues to be quite painful and she stated that she did not want any debridement of that site today. 01/10/2022: No real change to any of her wounds. She continues to accumulate thick slough. The dorsal foot wound has merged with the lateral malleolar wound. She is experiencing significant pain in the dorsal foot portion of the ulcer. 01/16/2022: Absolutely no change or progress in her wounds. 01/24/2022: Her wounds are unchanged. She continues to build up slough and the wounds on her dorsal right foot are still  exquisitely tender. 01/31/2022: The wounds actually measure a little bit narrower today. They still have thick slough on the surface but the underlying tissue seems a little less fibrotic. We changed to Iodosorb last week. Patient History Information obtained from Patient. Family History Diabetes - Mother, Lung Disease - Mother, No family history of Cancer, Heart Disease, Hereditary Spherocytosis, Hypertension, Kidney Disease, Seizures, Stroke, Thyroid Problems, Tuberculosis. Social History Never smoker, Marital Status - Married, Alcohol Use - Never, Drug Use - No History, Caffeine Use - Daily. Medical History Eyes Denies history of Cataracts, Glaucoma, Optic Neuritis Ear/Nose/Mouth/Throat Denies history of Chronic sinus problems/congestion, Middle ear problems Hematologic/Lymphatic Patient has history of Anemia, Sickle Cell Disease Denies history of Hemophilia, Human Immunodeficiency Virus, Lymphedema Respiratory Denies history of Aspiration, Asthma, Chronic Obstructive Pulmonary Disease (COPD), Pneumothorax, Sleep Apnea, Tuberculosis Cardiovascular Denies history of Angina,  Arrhythmia, Congestive Heart Failure, Coronary Artery Disease, Deep Vein Thrombosis, Hypertension, Hypotension, Myocardial Infarction, Peripheral Arterial Disease, Peripheral Venous Disease, Phlebitis, Vasculitis Gastrointestinal Denies history of Cirrhosis , Colitis, Crohnoos, Hepatitis A, Hepatitis B, Hepatitis C Endocrine Denies history of Type I Diabetes, Type II Diabetes Genitourinary Denies history of End Stage Renal Disease Immunological Denies history of Lupus Erythematosus, Raynaudoos, Scleroderma Integumentary (Skin) Denies history of History of Burn Musculoskeletal Denies history of Gout, Rheumatoid Arthritis, Osteoarthritis, Osteomyelitis Neurologic Patient has history of Neuropathy - right foot intermittant Denies history of Dementia, Quadriplegia, Paraplegia, Seizure Disorder Oncologic Denies history of Received Chemotherapy, Received Radiation Psychiatric Denies history of Anorexia/bulimia, Confinement Anxiety Hospitalization/Surgery History - c section x2. - left breast lumpectomy. - iandD right ankle with theraskin. Medical A Surgical History Notes nd Constitutional Symptoms (General Health) H/O miscarriage Cardiovascular bradycardia Gastrointestinal cholilithiasis Objective Constitutional Slightly hypertensive. No acute distress.. Vitals Time Taken: 12:56 PM, Height: 67 in, Temperature: 98.9 F, Pulse: 96 bpm, Respiratory Rate: 16 breaths/min, Blood Pressure: 142/76 mmHg. Respiratory Normal work of breathing on room air.. General Notes: 01/31/2022: The wounds actually measure a little bit narrower today. They still have thick slough on the surface but the underlying tissue seems a little less fibrotic. Integumentary (Hair, Skin) Wound #17 status is Open. Original cause of wound was Gradually Appeared. The date acquired was: 10/05/2012. The wound has been in treatment 41 weeks. The wound is located on the Right,Lateral Lower Leg. The wound measures 6cm length  x 4.8cm width x 0.1cm depth; 22.619cm^2 area and 2.262cm^3 volume. There is Fat Layer (Subcutaneous Tissue) exposed. There is no tunneling or undermining noted. There is a large amount of serosanguineous drainage noted. The wound margin is distinct with the outline attached to the wound base. There is small (1-33%) pink, pale granulation within the wound bed. There is a large (67-100%) amount of necrotic tissue within the wound bed including Adherent Slough. Wound #21 status is Open. Original cause of wound was Gradually Appeared. The date acquired was: 06/26/2021. The wound has been in treatment 31 weeks. The wound is located on the Right,Medial Ankle. The wound measures 5.7cm length x 2.3cm width x 0.1cm depth; 10.297cm^2 area and 1.03cm^3 volume. There is Fat Layer (Subcutaneous Tissue) exposed. There is no tunneling or undermining noted. There is a medium amount of serosanguineous drainage noted. The wound margin is distinct with the outline attached to the wound base. There is medium (34-66%) pink granulation within the wound bed. There is a medium (34-66%) amount of necrotic tissue within the wound bed including Adherent Slough. 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 There was a Selective/Open Wound Non-Viable Tissue Debridement with a total area of 28.8 sq cm performed by Fredirick Maudlin, MD. With the following instrument(s): Curette to remove Non-Viable tissue/material. Material removed includes [facility] after achieving pain control using Lidocaine 4% Topical Solution. No specimens were taken. A time out was conducted at 13:15, prior to the start of the procedure. A Minimum amount of bleeding was controlled with Pressure. The procedure was tolerated well  with a pain level of 0 throughout and a pain level of 0 following the procedure. Post Debridement Measurements: 6cm length x 4.8cm width x 0.1cm depth; 2.262cm^3 volume. Character of Wound/Ulcer Post Debridement is improved. Post procedure Diagnosis Wound #17: Same as Pre-Procedure General Notes: Scribed for Dr. Celine Ahr by J.[name]. Wound #21 Pre-procedure diagnosis of Wound #21 is a Sickle Cell Lesion located on the Right,Medial Ankle .Severity of Tissue Pre Debridement is: Fat layer exposed. There was a Selective/Open Wound Non-Viable Tissue Debridement with a total area of 13.11 sq cm performed by Fredirick Maudlin, MD. With the following instrument(s): Curette to remove Non-Viable tissue/material. Material removed includes [facility] after achieving pain control using Lidocaine 4% Topical Solution. No specimens were taken. A time out was conducted at 13:15, prior to the start of the procedure. A Minimum amount of bleeding was controlled with Pressure. The procedure was tolerated well with a pain level of 0 throughout and a pain level of 0 following the procedure. Post Debridement Measurements: 5.7cm length x 2.3cm width x 0.1cm depth; 1.03cm^3 volume. Character of Wound/Ulcer Post Debridement is improved. Severity of Tissue Post Debridement is: Fat layer exposed. Post procedure Diagnosis Wound #21: Same as Pre-Procedure General Notes: Scribed for Dr Celine Ahr by J.[name]. Plan Follow-up Appointments: Return Appointment in 1 week. - Dr. Celine Ahr Room 3 Thursday 02/07/22 at 12:30pm Anesthetic: (In clinic) Topical Lidocaine 5% applied to wound bed (In clinic) Topical Lidocaine 4% applied to wound bed - In Clinic Bathing/ Shower/ Hygiene: May shower with protection but do not get wound dressing(s) wet. - Can get cast protector bags at [facility] or [facility] Edema Control - Lymphedema / SCD / Other: Elevate legs to the level of the heart or above for 30 minutes daily and/or when sitting, a frequency of: -  throughout the day Avoid standing for long periods of time. Exercise regularly Additional Orders / Instructions: Follow Nutritious Diet WOUND #17: - Lower Leg Wound Laterality: Right, Lateral Cleanser: Soap and Water Every Other Day/30 Days Discharge Instructions: May shower and wash wound with dial antibacterial soap and water prior to dressing change. Cleanser: Wound Cleanser Every Other Day/30 Days Discharge Instructions: Cleanse the wound with wound cleanser prior to applying a clean dressing using gauze sponges, not tissue or cotton balls. Peri-Wound Care: Triamcinolone 15 (g) Every Other Day/30 Days Discharge Instructions: Use triamcinolone 15 (g) as directed Peri-Wound Care: Sween Lotion (Moisturizing lotion) Every Other Day/30 Days Discharge Instructions: Apply moisturizing lotion as directed Secondary Dressing: ABD Pad, 5x9 Every Other Day/30 Days Discharge Instructions: Apply over primary dressing as directed. Secondary Dressing: Zetuvit Plus 4x8 in Every Other Day/30 Days Discharge Instructions: Apply over primary dressing as directed. Secured With: Elastic Bandage 4 inch (ACE bandage) Every Other Day/30 Days Discharge Instructions: Secure with ACE bandage as directed. Com pression Wrap: Kerlix Roll 4.5x3.1 (in/yd) Every Other Day/30 Days Discharge Instructions: Apply Kerlix and Coban compression as directed.  WOUND #21: - Ankle Wound Laterality: Right, Medial Cleanser: Soap and Water Every Other Day/30 Days Discharge Instructions: May shower and wash wound with dial antibacterial soap and water prior to dressing change. Cleanser: Wound Cleanser Every Other Day/30 Days Discharge Instructions: Cleanse the wound with wound cleanser prior to applying a clean dressing using gauze sponges, not tissue or cotton balls. Peri-Wound Care: Triamcinolone 15 (g) Every Other Day/30 Days Discharge Instructions: Use triamcinolone 15 (g) as directed Peri-Wound Care: Sween Lotion (Moisturizing  lotion) Every Other Day/30 Days Discharge Instructions: Apply moisturizing lotion as directed Secondary Dressing: ABD Pad, 5x9 Every Other Day/30 Days Discharge Instructions: Apply over primary dressing as directed. Secondary Dressing: Zetuvit Plus 4x8 in Every Other Day/30 Days Discharge Instructions: Apply over primary dressing as directed. Secured With: Elastic Bandage 4 inch (ACE bandage) Every Other Day/30 Days Discharge Instructions: Secure with ACE bandage as directed. Com pression Wrap: Kerlix Roll 4.5x3.1 (in/yd) Every Other Day/30 Days Discharge Instructions: Apply Kerlix and Coban compression as directed. 01/31/2022: The wounds actually measure a little bit narrower today. They still have thick slough on the surface but the underlying tissue seems a little less fibrotic. I used a curette to debride the slough from her wounds. I am going to continue using the Iodosorb this week to see if we can continue her forward progress. Follow-up in 1 week's time. Electronic Signature(s) Signed: 01/31/2022 1:35:06 PM By: Fredirick Maudlin MD FACS Entered By: Fredirick Maudlin on 01/31/2022 13:35:05 -------------------------------------------------------------------------------- HxROS Details Patient Name: Date of Service: [name], [name] 01/31/2022 12:30 PM Medical Record Number: [id number] Patient Account Number: [id number] Date of Birth/Sex: Treating RN: 10/07/1970 (51 y.o. F) Primary Care Provider: [name] Other Clinician: Referring Provider: Treating Provider/Extender: Elyse Jarvis Weeks in Treatment: 62 Information Obtained From Patient Constitutional Symptoms (General Health) Medical History: Past Medical History Notes: H/O miscarriage Eyes Medical History: Negative for: Cataracts; Glaucoma; Optic Neuritis Ear/Nose/Mouth/Throat Medical History: Negative for: Chronic sinus problems/congestion; Middle ear problems Hematologic/Lymphatic Medical  History: Positive for: Anemia; Sickle Cell Disease Negative for: Hemophilia; Human Immunodeficiency Virus; Lymphedema Respiratory Medical History: Negative for: Aspiration; Asthma; Chronic Obstructive Pulmonary Disease (COPD); Pneumothorax; Sleep Apnea; Tuberculosis Cardiovascular Medical History: Negative for: Angina; Arrhythmia; Congestive Heart Failure; Coronary Artery Disease; Deep Vein Thrombosis; Hypertension; Hypotension; Myocardial Infarction; Peripheral Arterial Disease; Peripheral Venous Disease; Phlebitis; Vasculitis Past Medical History Notes: bradycardia Gastrointestinal Medical History: Negative for: Cirrhosis ; Colitis; Crohns; Hepatitis A; Hepatitis B; Hepatitis C Past Medical History Notes: cholilithiasis Endocrine Medical History: Negative for: Type I Diabetes; Type II Diabetes Genitourinary Medical History: Negative for: End Stage Renal Disease Immunological Medical History: Negative for: Lupus Erythematosus; Raynauds; Scleroderma Integumentary (Skin) Medical History: Negative for: History of Burn Musculoskeletal Medical History: Negative for: Gout; Rheumatoid Arthritis; Osteoarthritis; Osteomyelitis Neurologic Medical History: Positive for: Neuropathy - right foot intermittant Negative for: Dementia; Quadriplegia; Paraplegia; Seizure Disorder Oncologic Medical History: Negative for: Received Chemotherapy; Received Radiation Psychiatric Medical History: Negative for: Anorexia/bulimia; Confinement Anxiety Immunizations Pneumococcal Vaccine: Received Pneumococcal Vaccination: No Implantable Devices None Hospitalization / Surgery History Type of Hospitalization/Surgery c section x2 left breast lumpectomy iandD right ankle with theraskin Family and Social History Cancer: No; Diabetes: Yes - Mother; Heart Disease: No; Hereditary Spherocytosis: No; Hypertension: No; Kidney Disease: No; Lung Disease: Yes - Mother; Seizures: No; Stroke: No; Thyroid  Problems: No; Tuberculosis: No; Never smoker; Marital Status - Married; Alcohol Use: Never; Drug Use: No History; Caffeine Use: Daily; Financial Concerns: No; Food, Clothing or Shelter Needs: No; Support System Lacking:  No; Transportation Concerns: No Electronic Signature(s) Signed: 01/31/2022 2:07:58 PM By: Fredirick Maudlin MD FACS Entered By: Fredirick Maudlin on 01/31/2022 13:33:54 -------------------------------------------------------------------------------- SuperBill Details Patient Name: Date of Service: [name], [name] 01/31/2022 Medical Record Number: [id number] Patient Account Number: [id number] Date of Birth/Sex: Treating RN: 1970/11/18 (51 y.o. F) Primary Care Provider: [name] Other Clinician: Referring Provider: Treating Provider/Extender: Elyse Jarvis Weeks in Treatment: 41 Diagnosis Coding ICD-10 Codes Code Description L97.818 Non-pressure chronic ulcer of other part of right lower leg with other specified severity 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 Facility Procedures CPT4 Code: [id number] Description: [phone] - DEBRIDE WOUND 1ST 20 SQ CM OR < ICD-10 Diagnosis Description L97.818 Non-pressure chronic ulcer of other part of right lower leg with other specified s Modifier: everity Quantity: 1 CPT4 Code: [id number] Description: [id number] - DEBRIDE WOUND EA ADDL 20 SQ CM ICD-10 Diagnosis Description L97.818 Non-pressure chronic ulcer of other part of right lower leg with other specified s Modifier: everity Quantity: 2 Physician Procedures : CPT4 Code Description Modifier [id number] - WC PHYS LEVEL 3 - EST PT 25 ICD-10 Diagnosis Description L97.818 Non-pressure chronic ulcer of other part of right lower leg with other specified severity 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

## 2022-01-31 NOTE — Progress Notes (Signed)
MARITSSA, HAUGHTON ([id number]) Visit Report for 01/31/2022 Arrival Information Details Patient Name: Date of Service: [name] 01/31/2022 12:30 PM Medical Record Number: [id number] Patient Account Number: [id number] Date of Birth/Sex: Treating RN: 09-03-1970 (51 y.o. [name] Primary Care Cortina Vultaggio: [name] Other Clinician: Referring Cori Justus: Treating Havanah Nelms/Extender: Elyse Jarvis Weeks in Treatment: 70 Visit Information History Since Last Visit Added or deleted any medications: No Patient Arrived: [name] Any new allergies or adverse reactions: No Arrival Time: 12:56 Had a fall or experienced change in No Accompanied By: son activities of daily living that may affect Transfer Assistance: None risk of falls: Patient Requires Transmission-Based Precautions: No Signs or symptoms of abuse/neglect since last visito No Patient Has Alerts: No Hospitalized since last visit: No Implantable device outside of the clinic excluding No cellular tissue based products placed in the center since last visit: Has Dressing in Place as Prescribed: Yes Has Compression in Place as Prescribed: Yes Pain Present Now: Yes Electronic Signature(s) Signed: 01/31/2022 5:00:03 PM By: Dellie Catholic RN Entered By: Dellie Catholic on 01/31/2022 12:56:52 -------------------------------------------------------------------------------- Encounter Discharge Information Details Patient Name: Date of Service: [name], [name] 01/31/2022 12:30 PM Medical Record Number: [id number] Patient Account Number: [id number] Date of Birth/Sex: Treating RN: 01-Oct-1970 (51 y.o. [name] Primary Care Aliyyah Riese: [name] Other Clinician: Referring Collyns Mcquigg: Treating Soraya Paquette/Extender: Elyse Jarvis Weeks in Treatment: 109 Encounter Discharge Information Items Post Procedure Vitals Discharge Condition: Stable Temperature (F): 98.9 Ambulatory Status:  Walker Pulse (bpm): 96 Discharge Destination: Home Respiratory Rate (breaths/min): 16 Transportation: Private Auto Blood Pressure (mmHg): 142/76 Accompanied By: son Schedule Follow-up Appointment: Yes Clinical Summary of Care: Patient Declined Electronic Signature(s) Signed: 01/31/2022 5:00:03 PM By: Dellie Catholic RN Entered By: Dellie Catholic on 01/31/2022 16:59:25 -------------------------------------------------------------------------------- Lower Extremity Assessment Details Patient Name: Date of Service: [name], Volin [name] 01/31/2022 12:30 PM Medical Record Number: [id number] Patient Account Number: [id number] Date of Birth/Sex: Treating RN: 12-29-1970 (51 y.o. [name] Primary Care Denetria Luevanos: [name] Other Clinician: Referring Nara Paternoster: Treating Redell Nazir/Extender: Elyse Jarvis Weeks in Treatment: 41 Edema Assessment Assessed: [Left: No] [Right: No] Edema: [Left: Ye] [Right: s] Calf Left: Right: Point of Measurement: 33 cm From Medial Instep 28.3 cm 28.9 cm Ankle Left: Right: Point of Measurement: 10 cm From Medial Instep 19.8 cm 20.5 cm Electronic Signature(s) Signed: 01/31/2022 5:00:03 PM By: Dellie Catholic RN Entered By: Dellie Catholic on 01/31/2022 12:57:54 -------------------------------------------------------------------------------- Multi Wound Chart Details Patient Name: Date of Service: [name], [name] 01/31/2022 12:30 PM Medical Record Number: [id number] Patient Account Number: [id number] Date of Birth/Sex: Treating RN: November 02, 1970 (51 y.o. F) Primary Care Thomas Rhude: [name] Other Clinician: Referring Laurens Matheny: Treating Adan Beal/Extender: Elyse Jarvis Weeks in Treatment: 65 Vital Signs Height(in): 67 Pulse(bpm): 96 Weight(lbs): Blood Pressure(mmHg): 142/76 Body Mass Index(BMI): Temperature(F): 98.9 Respiratory Rate(breaths/min): 16 Photos: [N/A:N/A] Right, Lateral Lower Leg  Right, Medial Ankle N/A Wound Location: Gradually Appeared Gradually Appeared N/A Wounding Event: Sickle Cell Lesion Sickle Cell Lesion N/A Primary Etiology: N/A Venous Leg Ulcer N/A Secondary Etiology: Anemia, Sickle Cell Disease, Anemia, Sickle Cell Disease, N/A Comorbid History: Neuropathy Neuropathy 10/05/2012 06/26/2021 N/A Date Acquired: 40 31 N/A Weeks of Treatment: Open Open N/A Wound Status: No No N/A Wound Recurrence: Yes No N/A Clustered Wound: 6x4.8x0.1 5.7x2.3x0.1 N/A Measurements L x W x D (cm) 22.619 10.297 N/A A (cm) : rea 2.262 1.03 N/A Volume (cm) : 88.00% 64.50% N/A % Reduction in Area: 88.00%  64.50% N/A % Reduction in Volume: Full Thickness Without Exposed Full Thickness Without Exposed N/A Classification: Support Structures Support Structures Large Medium N/A Exudate A mount: Serosanguineous Serosanguineous N/A Exudate Type: red, brown red, brown N/A Exudate Color: Distinct, outline attached Distinct, outline attached N/A Wound Margin: Small (1-33%) Medium (34-66%) N/A Granulation A mount: Pink, Pale Pink N/A Granulation Quality: Large (67-100%) Medium (34-66%) N/A Necrotic A mount: Fat Layer (Subcutaneous Tissue): Yes Fat Layer (Subcutaneous Tissue): Yes N/A Exposed Structures: Fascia: No Fascia: No Tendon: No Tendon: No Muscle: No Muscle: No Joint: No Joint: No Bone: No Bone: No Medium (34-66%) Medium (34-66%) N/A Epithelialization: Debridement - Selective/Open Wound Debridement - Selective/Open Wound N/A Debridement: Pre-procedure Verification/Time Out 13:15 13:15 N/A Taken: Lidocaine 4% Topical Solution Lidocaine 4% Topical Solution N/A Pain Control: [employer] N/A Tissue Debrided: Non-Viable Tissue Non-Viable Tissue N/A Level: 28.8 13.11 N/A Debridement A (sq cm): rea Curette Curette N/A Instrument: Minimum Minimum N/A Bleeding: Pressure Pressure N/A Hemostasis A chieved: 0 0 N/A Procedural Pain: 0 0  N/A Post Procedural Pain: Procedure was tolerated well Procedure was tolerated well N/A Debridement Treatment Response: 6x4.8x0.1 5.7x2.3x0.1 N/A Post Debridement Measurements L x W x D (cm) 2.262 1.03 N/A Post Debridement Volume: (cm) Debridement Debridement N/A Procedures Performed: Treatment Notes Electronic Signature(s) Signed: 01/31/2022 1:32:07 PM By: Fredirick Maudlin MD FACS Entered By: Fredirick Maudlin on 01/31/2022 13:32:07 -------------------------------------------------------------------------------- Multi-Disciplinary Care Plan Details Patient Name: Date of Service: [name], [name] 01/31/2022 12:30 PM Medical Record Number: [id number] Patient Account Number: [id number] Date of Birth/Sex: Treating RN: 1970-11-15 (51 y.o. [name] Primary Care Dilon Lank: [name] Other Clinician: Referring Trebor Galdamez: Treating Pressley Tadesse/Extender: Elyse Jarvis Weeks in Treatment: 27 Multidisciplinary Care Plan reviewed with physician Active Inactive Venous Leg Ulcer Nursing Diagnoses: Actual venous Insuffiency (use after diagnosis is confirmed) Knowledge deficit related to disease process and management Goals: Patient will maintain optimal edema control Date Initiated: 06/26/2021 Target Resolution Date: 02/15/2022 Goal Status: Active Interventions: Assess peripheral edema status every visit. Compression as ordered Treatment Activities: Therapeutic compression applied : 06/26/2021 Notes: Wound/Skin Impairment Nursing Diagnoses: Impaired tissue integrity Goals: Patient/caregiver will verbalize understanding of skin care regimen Date Initiated: 04/17/2021 Target Resolution Date: 02/15/2022 Goal Status: Active Ulcer/skin breakdown will have a volume reduction of 30% by week 4 Date Initiated: 04/17/2021 Date Inactivated: 05/29/2021 Target Resolution Date: 05/15/2021 Goal Status: Met Ulcer/skin breakdown will have a volume reduction of 50% by  week 8 Date Initiated: 05/29/2021 Date Inactivated: 06/26/2021 Target Resolution Date: 06/26/2021 Goal Status: Unmet Unmet Reason: venous reflux Interventions: Assess patient/caregiver ability to obtain necessary supplies Assess patient/caregiver ability to perform ulcer/skin care regimen upon admission and as needed Assess ulceration(s) every visit Provide education on ulcer and skin care Treatment Activities: Topical wound management initiated : 04/17/2021 Notes: 06/08/21: Left leg wounds greater than 30% volume reduction, right leg acute infection. Electronic Signature(s) Signed: 01/31/2022 5:00:03 PM By: Dellie Catholic RN Entered By: Dellie Catholic on 01/31/2022 16:57:40 -------------------------------------------------------------------------------- Pain Assessment Details Patient Name: Date of Service: [name], Collin [name] 01/31/2022 12:30 PM Medical Record Number: [id number] Patient Account Number: [id number] Date of Birth/Sex: Treating RN: Oct 24, 1970 (51 y.o. [name] Primary Care Xitlalli Newhard: [name] Other Clinician: Referring Shantele Reller: Treating Nafisah Runions/Extender: Elyse Jarvis Weeks in Treatment: 55 Active Problems Location of Pain Severity and Description of Pain Patient Has Paino Yes Site Locations Pain Location: Pain Location: Generalized Pain With Dressing Change: Yes Duration of the Pain. Constant / Intermittento Constant Rate the  pain. Current Pain Level: 4 Worst Pain Level: 9 Least Pain Level: 4 Tolerable Pain Level: 3 Pain Management and Medication Current Pain Management: Medication: Yes Cold Application: No Rest: Yes Massage: No Activity: No T.E.N.S.: No Heat Application: No Leg drop or elevation: No Is the Current Pain Management Adequate: Adequate How does your wound impact your activities of daily livingo Sleep: No Bathing: No Appetite: No Relationship With Others: No Bladder Continence: No Emotions:  No Bowel Continence: No Work: No Toileting: No Drive: No Dressing: No Hobbies: No Electronic Signature(s) Signed: 01/31/2022 5:00:03 PM By: Dellie Catholic RN Entered By: Dellie Catholic on 01/31/2022 12:57:50 -------------------------------------------------------------------------------- Patient/Caregiver Education Details Patient Name: Date of Service: [name] 9/14/2023andnbsp12:30 PM Medical Record Number: [id number] Patient Account Number: [id number] Date of Birth/Gender: Treating RN: Mar 27, 1971 (51 y.o. [name] Primary Care Physician: [name] Other Clinician: Referring Physician: Treating Physician/Extender: Elyse Jarvis Weeks in Treatment: 22 Education Assessment Education Provided To: Patient Education Topics Provided Wound/Skin Impairment: Methods: Explain/Verbal Responses: Return demonstration correctly Electronic Signature(s) Signed: 01/31/2022 5:00:03 PM By: Dellie Catholic RN Entered By: Dellie Catholic on 01/31/2022 16:57:56 -------------------------------------------------------------------------------- Wound Assessment Details Patient Name: Date of Service: [name], Royal Kunia [name] 01/31/2022 12:30 PM Medical Record Number: [id number] Patient Account Number: [id number] Date of Birth/Sex: Treating RN: [birth year] (51 y.o. [name] Primary Care Hser Belanger: [name] Other Clinician: Referring Casper Pagliuca: Treating Bingham Millette/Extender: Elyse Jarvis Weeks in Treatment: 41 Wound Status Wound Number: 17 Primary Etiology: Sickle Cell Lesion Wound Location: Right, Lateral Lower Leg Wound Status: Open Wounding Event: Gradually Appeared Comorbid History: Anemia, Sickle Cell Disease, Neuropathy Date Acquired: 10/05/2012 Weeks Of Treatment: 41 Clustered Wound: Yes Photos Wound Measurements Length: (cm) 6 Width: (cm) 4.8 Depth: (cm) 0.1 Area: (cm) 22.619 Volume: (cm) 2.262 %  Reduction in Area: 88% % Reduction in Volume: 88% Epithelialization: Medium (34-66%) Tunneling: No Undermining: No Wound Description Classification: Full Thickness Without Exposed Support Structures Wound Margin: Distinct, outline attached Exudate Amount: Large Exudate Type: Serosanguineous Exudate Color: red, brown Foul Odor After Cleansing: No Slough/Fibrino Yes Wound Bed Granulation Amount: Small (1-33%) Exposed Structure Granulation Quality: Pink, Pale Fascia Exposed: No Necrotic Amount: Large (67-100%) Fat Layer (Subcutaneous Tissue) Exposed: Yes Necrotic Quality: Adherent Slough Tendon Exposed: No Muscle Exposed: No Joint Exposed: No Bone Exposed: No Treatment Notes Wound #17 (Lower Leg) Wound Laterality: Right, Lateral Cleanser Soap and Water Discharge Instruction: May shower and wash wound with dial antibacterial soap and water prior to dressing change. Wound Cleanser Discharge Instruction: Cleanse the wound with wound cleanser prior to applying a clean dressing using gauze sponges, not tissue or cotton balls. Peri-Wound Care Triamcinolone 15 (g) Discharge Instruction: Use triamcinolone 15 (g) as directed Sween Lotion (Moisturizing lotion) Discharge Instruction: Apply moisturizing lotion as directed Topical Primary Dressing Secondary Dressing ABD Pad, 5x9 Discharge Instruction: Apply over primary dressing as directed. Zetuvit Plus 4x8 in Discharge Instruction: Apply over primary dressing as directed. Secured With Elastic Bandage 4 inch (ACE bandage) Discharge Instruction: Secure with ACE bandage as directed. Compression Wrap Kerlix Roll 4.5x3.1 (in/yd) Discharge Instruction: Apply Kerlix and Coban compression as directed. Compression Stockings Add-Ons Electronic Signature(s) Signed: 01/31/2022 5:00:03 PM By: Dellie Catholic RN Entered By: Dellie Catholic on 01/31/2022  13:09:42 -------------------------------------------------------------------------------- Wound Assessment Details Patient Name: Date of Service: [name],  [name] 01/31/2022 12:30 PM Medical Record Number: [id number] Patient Account Number: [id number] Date of Birth/Sex: Treating RN: 1971-01-25 (51 y.o. [name] Primary Care Reyanna Baley: [name] Other  Clinician: Referring Othman Masur: Treating Jasline Buskirk/Extender: Elyse Jarvis Weeks in Treatment: 41 Wound Status Wound Number: 21 Primary Etiology: Sickle Cell Lesion Wound Location: Right, Medial Ankle Secondary Etiology: Venous Leg Ulcer Wounding Event: Gradually Appeared Wound Status: Open Date Acquired: 06/26/2021 Comorbid History: Anemia, Sickle Cell Disease, Neuropathy Weeks Of Treatment: 31 Clustered Wound: No Photos Wound Measurements Length: (cm) 5.7 Width: (cm) 2.3 Depth: (cm) 0.1 Area: (cm) 10.297 Volume: (cm) 1.03 Wound Description Classification: Full Thickness Without Exposed Support Structu Wound Margin: Distinct, outline attached Exudate Amount: Medium Exudate Type: Serosanguineous Exudate Color: red, brown Foul Odor After Cleansing: Slough/Fibrino % Reduction in Area: 64.5% % Reduction in Volume: 64.5% Epithelialization: Medium (34-66%) Tunneling: No Undermining: No res No Yes Wound Bed Granulation Amount: Medium (34-66%) Exposed Structure Granulation Quality: Pink Fascia Exposed: No Necrotic Amount: Medium (34-66%) Fat Layer (Subcutaneous Tissue) Exposed: Yes Necrotic Quality: Adherent Slough Tendon Exposed: No Muscle Exposed: No Joint Exposed: No Bone Exposed: No Treatment Notes Wound #21 (Ankle) Wound Laterality: Right, Medial Cleanser Soap and Water Discharge Instruction: May shower and wash wound with dial antibacterial soap and water prior to dressing change. Wound Cleanser Discharge Instruction: Cleanse the wound with wound cleanser prior to applying a  clean dressing using gauze sponges, not tissue or cotton balls. Peri-Wound Care Triamcinolone 15 (g) Discharge Instruction: Use triamcinolone 15 (g) as directed Sween Lotion (Moisturizing lotion) Discharge Instruction: Apply moisturizing lotion as directed Topical Primary Dressing Secondary Dressing ABD Pad, 5x9 Discharge Instruction: Apply over primary dressing as directed. Zetuvit Plus 4x8 in Discharge Instruction: Apply over primary dressing as directed. Secured With Elastic Bandage 4 inch (ACE bandage) Discharge Instruction: Secure with ACE bandage as directed. Compression Wrap Kerlix Roll 4.5x3.1 (in/yd) Discharge Instruction: Apply Kerlix and Coban compression as directed. Compression Stockings Add-Ons Electronic Signature(s) Signed: 01/31/2022 5:00:03 PM By: Dellie Catholic RN Entered By: Dellie Catholic on 01/31/2022 13:10:18 -------------------------------------------------------------------------------- Vitals Details Patient Name: Date of Service: [name], [name] 01/31/2022 12:30 PM Medical Record Number: [id number] Patient Account Number: [id number] Date of Birth/Sex: Treating RN: [birth year] (51 y.o. [name] Primary Care Laelia Angelo: [name] Other Clinician: Referring Demitrious Mccannon: Treating Vraj Denardo/Extender: Elyse Jarvis Weeks in Treatment: 21 Vital Signs Time Taken: 12:56 Temperature (F): 98.9 Height (in): 67 Pulse (bpm): 96 Respiratory Rate (breaths/min): 16 Blood Pressure (mmHg): 142/76 Reference Range: 80 - 120 mg / dl Electronic Signature(s) Signed: 01/31/2022 5:00:03 PM By: Dellie Catholic RN Entered By: Dellie Catholic on 01/31/2022 12:57:18

## 2022-02-07 ENCOUNTER — Encounter (HOSPITAL_BASED_OUTPATIENT_CLINIC_OR_DEPARTMENT_OTHER): Payer: Medicaid Other | Admitting: General Surgery

## 2022-02-07 DIAGNOSIS — L97818 Non-pressure chronic ulcer of other part of right lower leg with other specified severity: Secondary | ICD-10-CM | POA: Diagnosis not present

## 2022-02-07 NOTE — Progress Notes (Signed)
ZAFIRA, MUNOS ([id number]) Visit Report for 02/07/2022 Arrival Information Details Patient Name: Date of Service: [name] 02/07/2022 12:30 PM Medical Record Number: [id number] Patient Account Number: [id number] Date of Birth/Sex: Treating RN: Mar 01, 1971 (51 y.o. [name] Primary Care Sheylin Scharnhorst: [name] Other Clinician: Referring Xiadani Damman: Treating Liora Myles/Extender: Elyse Jarvis Weeks in Treatment: 59 Visit Information History Since Last Visit Added or deleted any medications: No Patient Arrived: [name] Any new allergies or adverse reactions: No Arrival Time: 12:54 Had a fall or experienced change in No Accompanied By: [id number] activities of daily living that may affect Transfer Assistance: None risk of falls: Patient Identification Verified: Yes Signs or symptoms of abuse/neglect since last visito No Patient Requires Transmission-Based Precautions: No Hospitalized since last visit: No Patient Has Alerts: No Implantable device outside of the clinic excluding No cellular tissue based products placed in the center since last visit: Has Dressing in Place as Prescribed: Yes Pain Present Now: No Electronic Signature(s) Signed: 02/07/2022 5:27:48 PM By: Dellie Catholic RN Entered By: Dellie Catholic on 02/07/2022 12:55:31 -------------------------------------------------------------------------------- Encounter Discharge Information Details Patient Name: Date of Service: [name], [name] 02/07/2022 12:30 PM Medical Record Number: [id number] Patient Account Number: [id number] Date of Birth/Sex: Treating RN: 03-Jan-1971 (51 y.o. [name] Primary Care Evyn Putzier: [name] Other Clinician: Referring Jisela Merlino: Treating Jasmen Emrich/Extender: Elyse Jarvis Weeks in Treatment: 67 Encounter Discharge Information Items Post Procedure Vitals Discharge Condition: Stable Temperature (F): 98.5 Ambulatory Status:  Wheelchair Pulse (bpm): 90 Discharge Destination: Home Respiratory Rate (breaths/min): 16 Transportation: Private Auto Blood Pressure (mmHg): 135/70 Accompanied By: self Schedule Follow-up Appointment: Yes Clinical Summary of Care: Patient Declined Electronic Signature(s) Signed: 02/07/2022 5:27:48 PM By: Dellie Catholic RN Entered By: Dellie Catholic on 02/07/2022 17:25:11 -------------------------------------------------------------------------------- Lower Extremity Assessment Details Patient Name: Date of Service: [name] 02/07/2022 12:30 PM Medical Record Number: [id number] Patient Account Number: [id number] Date of Birth/Sex: Treating RN: 1971/03/06 (51 y.o. [name] Primary Care Utah Delauder: [name] Other Clinician: Referring Sundra Haddix: Treating Tomoya Ringwald/Extender: Elyse Jarvis Weeks in Treatment: 42 Edema Assessment Assessed: [Left: No] [Right: No] Edema: [Left: Ye] [Right: s] Calf Left: Right: Point of Measurement: 33 cm From Medial Instep 28.3 cm 28.9 cm Ankle Left: Right: Point of Measurement: 10 cm From Medial Instep 19.8 cm 20.5 cm Electronic Signature(s) Signed: 02/07/2022 5:27:48 PM By: Dellie Catholic RN Entered By: Dellie Catholic on 02/07/2022 12:57:51 -------------------------------------------------------------------------------- Multi Wound Chart Details Patient Name: Date of Service: [name], [name] 02/07/2022 12:30 PM Medical Record Number: [id number] Patient Account Number: [id number] Date of Birth/Sex: Treating RN: Aug 23, 1970 (51 y.o. [name] Primary Care Velma Agnes: [name] Other Clinician: Referring Neha Waight: Treating Wess Baney/Extender: Elyse Jarvis Weeks in Treatment: 42 Vital Signs Height(in): 67 Pulse(bpm): 90 Weight(lbs): Blood Pressure(mmHg): 135/70 Body Mass Index(BMI): Temperature(F): 98.4 Respiratory Rate(breaths/min): 16 Photos:  [N/A:N/A] Right, Lateral Lower Leg Right, Medial Ankle N/A Wound Location: Gradually Appeared Gradually Appeared N/A Wounding Event: Sickle Cell Lesion Sickle Cell Lesion N/A Primary Etiology: N/A Venous Leg Ulcer N/A Secondary Etiology: Anemia, Sickle Cell Disease, Anemia, Sickle Cell Disease, N/A Comorbid History: Neuropathy Neuropathy 10/05/2012 06/26/2021 N/A Date Acquired: 83 32 N/A Weeks of Treatment: Open Open N/A Wound Status: No No N/A Wound Recurrence: Yes No N/A Clustered Wound: 8x4.8x0.1 5.4x3x0.1 N/A Measurements L x W x D (cm) 30.159 12.723 N/A A (cm) : rea 3.016 1.272 N/A Volume (cm) : 84.00% 56.10% N/A % Reduction in Area: 83.90% 56.10%  N/A % Reduction in Volume: Full Thickness Without Exposed Full Thickness Without Exposed N/A Classification: Support Structures Support Structures Large Medium N/A Exudate A mount: Serosanguineous Serosanguineous N/A Exudate Type: red, brown red, brown N/A Exudate Color: Distinct, outline attached Distinct, outline attached N/A Wound Margin: Small (1-33%) Medium (34-66%) N/A Granulation A mount: Pink, Pale Pink N/A Granulation Quality: Large (67-100%) Medium (34-66%) N/A Necrotic A mount: Eschar, Adherent Slough Eschar, Adherent Slough N/A Necrotic Tissue: Fat Layer (Subcutaneous Tissue): Yes Fat Layer (Subcutaneous Tissue): Yes N/A Exposed Structures: Fascia: No Fascia: No Tendon: No Tendon: No Muscle: No Muscle: No Joint: No Joint: No Bone: No Bone: No Small (1-33%) Medium (34-66%) N/A Epithelialization: Debridement - Selective/Open Wound Debridement - Selective/Open Wound N/A Debridement: Pre-procedure Verification/Time Out 13:35 13:35 N/A Taken: Lidocaine 4% Topical Solution Lidocaine 4% Topical Solution N/A Pain Control: [employer] N/A Tissue Debrided: Non-Viable Tissue Non-Viable Tissue N/A Level: 38.4 16.2 N/A Debridement A (sq cm): rea Curette Curette N/A Instrument: Minimum  Minimum N/A Bleeding: Pressure Pressure N/A Hemostasis A chieved: 0 0 N/A Procedural Pain: 0 0 N/A Post Procedural Pain: Procedure was tolerated well Procedure was tolerated well N/A Debridement Treatment Response: 8x4.8x0.1 5.4x3x0.1 N/A Post Debridement Measurements L x W x D (cm) 3.016 1.272 N/A Post Debridement Volume: (cm) Debridement Debridement N/A Procedures Performed: Treatment Notes Electronic Signature(s) Signed: 02/07/2022 1:53:20 PM By: Fredirick Maudlin MD FACS Signed: 02/07/2022 5:27:48 PM By: Dellie Catholic RN Entered By: Fredirick Maudlin on 02/07/2022 13:53:20 -------------------------------------------------------------------------------- Multi-Disciplinary Care Plan Details Patient Name: Date of Service: [name], [name] 02/07/2022 12:30 PM Medical Record Number: [id number] Patient Account Number: [id number] Date of Birth/Sex: Treating RN: 1971-05-13 (51 y.o. [name] Primary Care Morton Simson: [name] Other Clinician: Referring Legion Discher: Treating Lynford Espinoza/Extender: Elyse Jarvis Weeks in Treatment: 50 Multidisciplinary Care Plan reviewed with physician Active Inactive Venous Leg Ulcer Nursing Diagnoses: Actual venous Insuffiency (use after diagnosis is confirmed) Knowledge deficit related to disease process and management Goals: Patient will maintain optimal edema control Date Initiated: 06/26/2021 Target Resolution Date: 05/19/2022 Goal Status: Active Interventions: Assess peripheral edema status every visit. Compression as ordered Treatment Activities: Therapeutic compression applied : 06/26/2021 Notes: Wound/Skin Impairment Nursing Diagnoses: Impaired tissue integrity Goals: Patient/caregiver will verbalize understanding of skin care regimen Date Initiated: 04/17/2021 Target Resolution Date: 05/19/2022 Goal Status: Active Ulcer/skin breakdown will have a volume reduction of 30% by week 4 Date Initiated:  04/17/2021 Date Inactivated: 05/29/2021 Target Resolution Date: 05/15/2021 Goal Status: Met Ulcer/skin breakdown will have a volume reduction of 50% by week 8 Date Initiated: 05/29/2021 Date Inactivated: 06/26/2021 Target Resolution Date: 06/26/2021 Goal Status: Unmet Unmet Reason: venous reflux Interventions: Assess patient/caregiver ability to obtain necessary supplies Assess patient/caregiver ability to perform ulcer/skin care regimen upon admission and as needed Assess ulceration(s) every visit Provide education on ulcer and skin care Treatment Activities: Topical wound management initiated : 04/17/2021 Notes: 06/08/21: Left leg wounds greater than 30% volume reduction, right leg acute infection. Electronic Signature(s) Signed: 02/07/2022 5:27:48 PM By: Dellie Catholic RN Entered By: Dellie Catholic on 02/07/2022 13:40:25 -------------------------------------------------------------------------------- Pain Assessment Details Patient Name: Date of Service: [name], [place] [name] 02/07/2022 12:30 PM Medical Record Number: [id number] Patient Account Number: [id number] Date of Birth/Sex: Treating RN: 1971/02/21 (51 y.o. [name] Primary Care Alyza Artiaga: [name] Other Clinician: Referring Dovey Fatzinger: Treating Broly Hatfield/Extender: Elyse Jarvis Weeks in Treatment: 28 Active Problems Location of Pain Severity and Description of Pain Patient Has Paino Yes Site Locations Pain Location: Pain Location:  Pain in Ulcers With Dressing Change: No Duration of the Pain. Constant / Intermittento Constant Rate the pain. Current Pain Level: 3 Worst Pain Level: 10 Least Pain Level: 3 Tolerable Pain Level: 3 Character of Pain Describe the Pain: Difficult to Pinpoint Pain Management and Medication Current Pain Management: Medication: No Cold Application: No Rest: Yes Massage: No Activity: No T.E.N.S.: No Heat Application: No Leg drop or elevation: No Is the  Current Pain Management Adequate: Adequate How does your wound impact your activities of daily livingo Sleep: No Bathing: No Appetite: No Relationship With Others: No Bladder Continence: No Emotions: No Bowel Continence: No Work: No Toileting: No Drive: No Dressing: No Hobbies: No Electronic Signature(s) Signed: 02/07/2022 5:27:48 PM By: Dellie Catholic RN Entered By: Dellie Catholic on 02/07/2022 12:57:13 -------------------------------------------------------------------------------- Patient/Caregiver Education Details Patient Name: Date of Service: [name] 9/21/2023andnbsp12:30 PM Medical Record Number: [id number] Patient Account Number: [id number] Date of Birth/Gender: Treating RN: September 09, 1970 (51 y.o. [name] Primary Care Physician: [name] Other Clinician: Referring Physician: Treating Physician/Extender: Elyse Jarvis Weeks in Treatment: 25 Education Assessment Education Provided To: Patient Education Topics Provided Wound/Skin Impairment: Methods: Explain/Verbal Responses: Return demonstration correctly Electronic Signature(s) Signed: 02/07/2022 5:27:48 PM By: Dellie Catholic RN Entered By: Dellie Catholic on 02/07/2022 13:40:47 -------------------------------------------------------------------------------- Wound Assessment Details Patient Name: Date of Service: [name], [place] [name] 02/07/2022 12:30 PM Medical Record Number: [id number] Patient Account Number: [id number] Date of Birth/Sex: Treating RN: 06-13-1970 (51 y.o. [name] Primary Care Nillie Bartolotta: [name] Other Clinician: Referring Rockell Faulks: Treating Collan Schoenfeld/Extender: Elyse Jarvis Weeks in Treatment: 42 Wound Status Wound Number: 17 Primary Etiology: Sickle Cell Lesion Wound Location: Right, Lateral Lower Leg Wound Status: Open Wounding Event: Gradually Appeared Comorbid History: Anemia, Sickle Cell Disease,  Neuropathy Date Acquired: 10/05/2012 Weeks Of Treatment: 42 Clustered Wound: Yes Photos Wound Measurements Length: (cm) 8 Width: (cm) 4.8 Depth: (cm) 0.1 Area: (cm) 30.159 Volume: (cm) 3.016 % Reduction in Area: 84% % Reduction in Volume: 83.9% Epithelialization: Small (1-33%) Tunneling: No Undermining: No Wound Description Classification: Full Thickness Without Exposed Support Structures Wound Margin: Distinct, outline attached Exudate Amount: Large Exudate Type: Serosanguineous Exudate Color: red, brown Foul Odor After Cleansing: No Slough/Fibrino Yes Wound Bed Granulation Amount: Small (1-33%) Exposed Structure Granulation Quality: Pink, Pale Fascia Exposed: No Necrotic Amount: Large (67-100%) Fat Layer (Subcutaneous Tissue) Exposed: Yes Necrotic Quality: Eschar, Adherent Slough Tendon Exposed: No Muscle Exposed: No Joint Exposed: No Bone Exposed: No Treatment Notes Wound #17 (Lower Leg) Wound Laterality: Right, Lateral Cleanser Soap and Water Discharge Instruction: May shower and wash wound with dial antibacterial soap and water prior to dressing change. Wound Cleanser Discharge Instruction: Cleanse the wound with wound cleanser prior to applying a clean dressing using gauze sponges, not tissue or cotton balls. Peri-Wound Care Triamcinolone 15 (g) Discharge Instruction: Use triamcinolone 15 (g) as directed Sween Lotion (Moisturizing lotion) Discharge Instruction: Apply moisturizing lotion as directed Topical Primary Dressing Secondary Dressing ABD Pad, 5x9 Discharge Instruction: Apply over primary dressing as directed. Zetuvit Plus 4x8 in Discharge Instruction: Apply over primary dressing as directed. Secured With Elastic Bandage 4 inch (ACE bandage) Discharge Instruction: Secure with ACE bandage as directed. Compression Wrap Kerlix Roll 4.5x3.1 (in/yd) Discharge Instruction: Apply Kerlix and Coban compression as directed. Compression  Stockings Add-Ons Electronic Signature(s) Signed: 02/07/2022 5:27:48 PM By: Dellie Catholic RN Entered By: Dellie Catholic on 02/07/2022 13:18:02 -------------------------------------------------------------------------------- Wound Assessment Details Patient Name: Date of Service: [name] UMA, [name] 02/07/2022  12:30 PM Medical Record Number: [id number] Patient Account Number: [id number] Date of Birth/Sex: Treating RN: [birth year] (51 y.o. [name] Primary Care Ramond Darnell: [name] Other Clinician: Referring Dashawn Bartnick: Treating Patriece Archbold/Extender: Elyse Jarvis Weeks in Treatment: 42 Wound Status Wound Number: 21 Primary Etiology: Sickle Cell Lesion Wound Location: Right, Medial Ankle Secondary Etiology: Venous Leg Ulcer Wounding Event: Gradually Appeared Wound Status: Open Date Acquired: 06/26/2021 Comorbid History: Anemia, Sickle Cell Disease, Neuropathy Weeks Of Treatment: 32 Clustered Wound: No Photos Wound Measurements Length: (cm) 5.4 Width: (cm) 3 Depth: (cm) 0.1 Area: (cm) 12.723 Volume: (cm) 1.272 % Reduction in Area: 56.1% % Reduction in Volume: 56.1% Epithelialization: Medium (34-66%) Tunneling: No Undermining: No Wound Description Classification: Full Thickness Without Exposed Support Structures Wound Margin: Distinct, outline attached Exudate Amount: Medium Exudate Type: Serosanguineous Exudate Color: red, brown Foul Odor After Cleansing: No Slough/Fibrino Yes Wound Bed Granulation Amount: Medium (34-66%) Exposed Structure Granulation Quality: Pink Fascia Exposed: No Necrotic Amount: Medium (34-66%) Fat Layer (Subcutaneous Tissue) Exposed: Yes Necrotic Quality: Eschar, Adherent Slough Tendon Exposed: No Muscle Exposed: No Joint Exposed: No Bone Exposed: No Treatment Notes Wound #21 (Ankle) Wound Laterality: Right, Medial Cleanser Soap and Water Discharge Instruction: May shower and wash wound with dial  antibacterial soap and water prior to dressing change. Wound Cleanser Discharge Instruction: Cleanse the wound with wound cleanser prior to applying a clean dressing using gauze sponges, not tissue or cotton balls. Peri-Wound Care Triamcinolone 15 (g) Discharge Instruction: Use triamcinolone 15 (g) as directed Sween Lotion (Moisturizing lotion) Discharge Instruction: Apply moisturizing lotion as directed Topical Primary Dressing Secondary Dressing ABD Pad, 5x9 Discharge Instruction: Apply over primary dressing as directed. Zetuvit Plus 4x8 in Discharge Instruction: Apply over primary dressing as directed. Secured With Elastic Bandage 4 inch (ACE bandage) Discharge Instruction: Secure with ACE bandage as directed. Compression Wrap Kerlix Roll 4.5x3.1 (in/yd) Discharge Instruction: Apply Kerlix and Coban compression as directed. Compression Stockings Add-Ons Electronic Signature(s) Signed: 02/07/2022 5:27:48 PM By: Dellie Catholic RN Entered By: Dellie Catholic on 02/07/2022 13:18:32 -------------------------------------------------------------------------------- Vitals Details Patient Name: Date of Service: [name], [name] 02/07/2022 12:30 PM Medical Record Number: [id number] Patient Account Number: [id number] Date of Birth/Sex: Treating RN: June 15, 1970 (51 y.o. [name] Primary Care Makylah Bossard: Other Clinician: [name] Referring Sydna Brodowski: Treating Ancel Easler/Extender: Elyse Jarvis Weeks in Treatment: 42 Vital Signs Time Taken: 12:53 Temperature (F): 98.4 Height (in): 67 Pulse (bpm): 90 Respiratory Rate (breaths/min): 16 Blood Pressure (mmHg): 135/70 Reference Range: 80 - 120 mg / dl Electronic Signature(s) Signed: 02/07/2022 5:27:48 PM By: Dellie Catholic RN Entered By: Dellie Catholic on 02/07/2022 10:25:48

## 2022-02-07 NOTE — Progress Notes (Signed)
[name], [name] ([id number]) Visit Report for 02/07/2022 Chief Complaint Document Details Patient Name: Date of Service: [name] [name] 02/07/2022 12:30 PM Medical Record Number: [id number] Patient Account Number: [id number] Date of Birth/Sex: Treating RN: 1970/09/20 (51 y.o. [name] Primary Care Provider: [name] Other Clinician: Referring Provider: Treating Provider/Extender: Elyse Jarvis Weeks in Treatment: 35 Information Obtained from: Patient Chief Complaint the patient is here for evaluation of her bilateral lower extremity sickle cell ulcers 04/17/2021; patient comes in for substantial wounds on the right and left lower leg Electronic Signature(s) Signed: 02/07/2022 1:53:27 PM By: Fredirick Maudlin MD FACS Entered By: Fredirick Maudlin on 02/07/2022 13:53:27 -------------------------------------------------------------------------------- Debridement Details Patient Name: Date of Service: [name], [name] 02/07/2022 12:30 PM Medical Record Number: [id number] Patient Account Number: [id number] Date of Birth/Sex: Treating RN: 1970-10-11 (51 y.o. [name] Primary Care Provider: [name] Other Clinician: Referring Provider: Treating Provider/Extender: Elyse Jarvis Weeks in Treatment: 42 Debridement Performed for Assessment: Wound #21 Right,Medial Ankle Performed By: Physician Fredirick Maudlin, MD Debridement Type: Debridement Severity of Tissue Pre Debridement: Fat layer exposed Level of Consciousness (Pre-procedure): Awake and Alert Pre-procedure Verification/Time Out Yes - 13:35 Taken: Start Time: 13:35 Pain Control: Lidocaine 4% T opical Solution T Area Debrided (L x W): otal 5.4 (cm) x 3 (cm) = 16.2 (cm) Tissue and other material debrided: Non-Viable, Slough, Slough Level: Non-Viable Tissue Debridement Description: Selective/Open Wound Instrument: Curette Bleeding: Minimum Hemostasis  Achieved: Pressure End Time: 13:36 Procedural Pain: 0 Post Procedural Pain: 0 Response to Treatment: Procedure was tolerated well Level of Consciousness (Post- Awake and Alert procedure): Post Debridement Measurements of Total Wound Length: (cm) 5.4 Width: (cm) 3 Depth: (cm) 0.1 Volume: (cm) 1.272 Character of Wound/Ulcer Post Debridement: Improved Severity of Tissue Post Debridement: Fat layer exposed Post Procedure Diagnosis Same as Pre-procedure Notes Scribed for Dr.Sully Dyment by J.[name] Electronic Signature(s) Signed: 02/07/2022 3:00:56 PM By: Fredirick Maudlin MD FACS Signed: 02/07/2022 5:27:48 PM By: Dellie Catholic RN Entered By: Dellie Catholic on 02/07/2022 13:39:54 -------------------------------------------------------------------------------- Debridement Details Patient Name: Date of Service: [name], [name] 02/07/2022 12:30 PM Medical Record Number: [id number] Patient Account Number: [id number] Date of Birth/Sex: Treating RN: 02/13/1971 (51 y.o. [name] Primary Care Provider: [name] Other Clinician: Referring Provider: Treating Provider/Extender: Elyse Jarvis Weeks in Treatment: 42 Debridement Performed for Assessment: Wound #17 Right,Lateral Lower Leg Performed By: Physician Fredirick Maudlin, MD Debridement Type: Debridement Level of Consciousness (Pre-procedure): Awake and Alert Pre-procedure Verification/Time Out Yes - 13:35 Taken: Start Time: 13:35 Pain Control: Lidocaine 4% T opical Solution T Area Debrided (L x W): otal 8 (cm) x 4.8 (cm) = 38.4 (cm) Tissue and other material debrided: Non-Viable, Slough, Slough Level: Non-Viable Tissue Debridement Description: Selective/Open Wound Instrument: Curette Bleeding: Minimum Hemostasis Achieved: Pressure End Time: 13:36 Procedural Pain: 0 Post Procedural Pain: 0 Response to Treatment: Procedure was tolerated well Level of Consciousness (Post- Awake and  Alert procedure): Post Debridement Measurements of Total Wound Length: (cm) 8 Width: (cm) 4.8 Depth: (cm) 0.1 Volume: (cm) 3.016 Character of Wound/Ulcer Post Debridement: Improved Post Procedure Diagnosis Same as Pre-procedure Notes Scribed for Dr.Sherlyn Ebbert by J.[name] Electronic Signature(s) Signed: 02/07/2022 3:00:56 PM By: Fredirick Maudlin MD FACS Signed: 02/07/2022 5:27:48 PM By: Dellie Catholic RN Entered By: Dellie Catholic on 02/07/2022 13:58:05 -------------------------------------------------------------------------------- HPI Details Patient Name: Date of Service: [name] [name], [name] 02/07/2022 12:30 PM Medical Record Number: [id number] Patient Account Number: [id number] Date of Birth/Sex: Treating RN:  [birth year] (51 y.o. [name] Primary Care Provider: [name] Other Clinician: Referring Provider: Treating Provider/Extender: Elyse Jarvis Weeks in Treatment: 43 History of Present Illness Location: medial and lateral ankle region on the right and left medial malleolus Quality: Patient reports experiencing a shooting pain to affected area(s). Severity: Patient states wound(s) are getting worse. Duration: right lower extremity bimalleolar ulcers have been present for approximately 2 years; the rright meedial malleolus ulcer has been there proximally 6 months Timing: Pain in wound is constant (hurts all the time) Context: The wound would happen gradually ssociated Signs and Symptoms: Patient reports having increase discharge. A HPI Description: 51 year old patient with a history of sickle cell anemia who was last seen by me with ulceration of the right lower extremity above the ankle and was referred to Dr. Leland Johns for a surgical debridement as I was unable to do anything in the office due to excruciating pain. At that stage she was referred from the plastic surgery service to dermatology who treated her for a skin infection with  doxycycline and then Levaquin and a local antibiotic ointment. I understand the patient has since developed ulceration on the left ankle both medial and lateral and was now referred back to the wound center as dermatology has finished the management. I do not have any notes from the dermatology department Old notes: 51 year old patient with a history of sickle cell anemia, pain bilateral lower extremities, right lower extremity ulcer and has a history of receiving a skin graft( Theraskin) several months ago. She has been visiting the wound center [facility] and was seen by Dr. Dellia Nims and Dr. Leland Johns. after prolonged conservator therapy between July 2016 and January 2017. She had been seen by the plastic surgeon and taken to the OR for debridement and application of Theraskin. She had 3 applications of Theraskin and was then treated with collagen. Prior to that she had a history of similar problems in 2014 and was treated conservatively. Had a reflux study done for the right lower extremity in August 2016 without reflux or DVT . Past medical history significant for sickle cell disease, anemia, leg ulcers, cholelithiasis,and has never been a smoker. Once the patient was discharged on the wound center she says within 2 or 3 weeks the problems recurred and she has been treating it conservatively. since I saw her 3 weeks ago at [facility] she has been unable to get her dressing material but has completed a course of doxycycline. 6/7/ 2017 -- lower extremity venous duplex reflux evaluation was done No evidence of SVT or DVT in the RLL. No venous incompetence in the RLL. No further vascular workup is indicated at this time. She was seen by Dr. [name], on 10/04/2015. She agreed with the plan of taking her to the OR for debridement and application of theraskin and would also take biopsies to rule out pyoderma gangrenosum. Follow-up note dated May 31 received and she was status post application of  Theraskin to multiple ulcers around the right ankle. Pathology did not show evidence of malignancy or pyoderma gangrenosum. She would continue to see as in the wound clinic for further care and see Dr. Leland Johns as needed. The patient brought the biopsy report and it was consistent with stasis ulcer no evidence of malignancy and the comment was that there was some adjacent neovascularization, fibrosis and patchy perivascular chronic inflammation. 11/15/2015 -- today we applied her first application of Theraskin 11/30/15; TheraSkin #2 12/13/2015 -- she is having a lot  of pain locally and is here for possible application of a theraskin today. 01/16/2016 -- the patient has significant pain and has noticed despite in spite of all local care and oral pain medication. It is impossible to debride her in the office. 02/06/2016 -- I do not see any notes from Dr. Iran Planas( the patient has not made a call to the office know as she heard from them) and the only visit to recently was with her PCP Dr. Danella Penton -- I saw her on 01/16/2016 and prescribed 90 tablets of oxycodone 10 mg and did lab work and screening for HIV. the HIV was negative and hemoglobin was 6.3 with a WBC count of 14.9 and hematocrit of 17.8 with platelets of 561. 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 The bimalleolar ulcers to the right lower extremity have been present for approximately 2 years, the left medial malleolus ulcer has been present approximately 6 months. She has followed with Dr.Thimmappa in the past and has had a total of 3 applications of Theraskin (01/2015, 09/2015, 06/17/16). She has also followed with Dr. Con Memos here in the clinic and has received 2 applications of TheraSkin (11/10/15, 11/30/15). The patient does experience chronic, and is not amenable to debridement. She had a sickle cell crisis in December  2017, prior to that has been several years. She is not currently on any antibiotic therapy and has not been treated with any recently. 07/17/2016 -- was seen by Dr. Iran Planas of plastic surgery who saw her 2 weeks postop application of Theraskin #3. She had removed her dressing and asked her to apply silver alginate on alternate days and follow-up back with the wound center. Future debridements and application of skin substitute would have to be done in the hospital due to her high risk for anesthesia. READMISSION 04/17/2021 Patient is now a 51 year old woman that we have had in this clinic for a prolonged period of time and 2016-2017 and then again for 2 visits in February 2018. At that point she had wounds on the right lower leg predominantly medial. She had also been seen by plastic surgery Dr. Leland Johns who I believe took her to the OR for operative debridement and application of TheraSkin in 2017. After she left our clinic she was followed for a very prolonged period of time in the wound care center in [facility] who then referred her ultimately to [facility] where she was seen by Dr. Vernona Rieger. Again taken her to the OR for skin grafting which apparently did not take. She had multiple other attempts at dressings although I have not really looked over all of these notes in great detail. She has not been seen in a wound care center in about a year. She states over the last year in addition to her right lower leg she has developed wounds on the left lower leg quite extensive. She is using Xeroform to all of these wounds without really any improvement. She also has Medicaid which does not cover wound products. The patient has had vascular work-ups in the past including most recently on 03/28/2021 showing biphasic waveforms on the right triphasic at the PTA and biphasic at the dorsalis pedis on the left. She was unable to tolerate any degree of compression to do ABIs. Unfortunately TBI's were also not done.  She had venous reflux studies done in 2017. This did not show any evidence of a DVT or  SVT and no venous incompetence was noted in the right leg at the time this was the only side with the wound As noted I did not look all over her old records. She apparently had a course of HBO and [facility] although I am not sure what the indication would have been. In any case she developed seizures and terminated treatment earlier. She is generally much more disabled than when we last saw her in clinic. She can no longer walk pretty much wheelchair-bound because predominantly of pain in the left hip. 04/24/2021; the patient tolerated the wraps we put on. We used Santyl and Hydrofera Blue under compression. I brought her back for a nurse visit for a change in dressing. With Medicaid we will have a hard time getting anything paid for and hence the need for compression. She arrives in clinic with all the wounds looking somewhat better in terms of surface 12/20; circumferential wound on the right from the lateral to the medial. She has open areas on the left medial and left lateral x2 on all of this with the same surface. This does not look completely healthy although she does have some epithelialization. She is not complaining of a lot of pain which is unusual for her sickle ulcers. I have not looked over her extensive records from [facility]. She had recent arterial studies and has a history of venous reflux studies I will need to look these over although I do not believe she has significant arterial disease 2023 05/22/2021; patient's wound areas measure slightly smaller. Still a lot of drainage coming from the right we have been using Hydrofera Blue and Santyl with some improvement in the wound surfaces. She tells me she will be getting transfused later in the week for her underlying sickle cell anemia I have looked over her recent arterial studies which were done in the fall. This was in November and showed biphasic and  triphasic waveforms but she could not tolerate ABIs because of pressure and unfortunately TBI's were not done. She has not had recent venous reflux studies that I can see 1/10; not much change about the same surface area. This has a yellowish surface to it very gritty. We have been using Santyl and Hydrofera Blue for a prolonged period. Culture I did last week showed methicillin sensitive staph aureus "rare". Our intake nurse reports greenish drainage which may be the Hydrofera Blue itself 1/17; wounds are continue to measure smaller although I am not sure about the accuracy here. Especially the areas on the right are covered in what looks to be a nonviable surface although she does have some epithelialization. Similarly she has areas on the left medial and left lateral ankle area which appear to have a better surface and perhaps are slightly smaller. We have been using Santyl and Hydrofera Blue. She cannot tolerate mechanical debridements She went for her reflux studies which showed significant reflux at the greater saphenous vein at the saphenofemoral junction as well as the greater saphenous vein in the proximal calf on the left she had reflux in the thigh and the common femoral vein and supra vein Fishel vein reflux in the greater saphenous vein. I will have vein and vascular look at this. My thoughts have been that these are likely sickle wounds. I looked through her old records from [facility] wound care center and then when she graduated to [facility] wound care center where she saw Dr. Zigmund Daniel and Dr. Vernona Rieger. Although I can see she had  reflux studies done I do not see that she actually saw a vein and vascular. I went over the fact that she had operative debridements and actual skin grafting that did not take. I do not think these wounds have ever really progressed towards healing 1/31;Substantial wounds on the right ankle area. Hyper granulated very gritty adherent debris on the surface.  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 On the left she has no DVT or SVT deep vein reflux in the common femoral vein. Superficial vein reflux in the greater saphenous vein on although the vein diameter is not really all that large. I do not think there is anything that can be done with these although I am going to send her for consultation to vein and vascular. 2/7; Wound exam; substantial wound area on the right posterior ankle area and areas on the left medial ankle and left lateral ankle. I was able to debride the left medial ankle last week fairly aggressively and it is back this week to a completely nonviable surface She will see vascular surgery this Friday and I would like them to review the venous studies and also any comments on her arterial status. If they do not see an issue here I am going to refer her to plastic surgery for an operative debridement perhaps intraoperative ACell or Integra. Eventually she will require a deep tissue culture again 2/14; substantial wound area on the right posterior ankle, medial ankle. We have been using silver alginate The patient was seen by vein and vascular she had both venous reflux studies and arterial studies. 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 She had no evidence of superficial venous thrombosis. She did have superficial vein reflux at the saphenofemoral junction and the greater saphenous vein. On the left no evidence of a DVT no evidence of superficial venous throat thrombosis she did have deep vein reflux  in the common femoral vein and superficial vein reflux in the greater saphenous vein but these were not felt to be amenable to ablation. In terms of arterial studies she had triphasic and wife biphasic wave waveforms bilaterally not felt to have a significant arterial issue. I do not get the feeling that they felt that any part of her nonhealing wounds were related to either arterial or venous issues. They did note that she had venous reflux at the right at the [facility] and [facility]. And also on the left there were reflux in the deep system at the common femoral vein and greater saphenous vein in the proximal thigh. Nothing amenable to ablation. 2/20; she is making some decent progress on the right where there is nice skin between the 2 open areas on the right ankle. The surfaces here do not look viable yet there is some surrounding epithelialization. She still has a small area on the left medial ankle area. Hyper-granulated Jody's away always 2/28 patient has an appointment with plastic surgery on 3/8. We will see her back on 3/9. She may have to call us to get the area redressed.  We've been using Santyl under silver alginate. We made a nice improvement on the left medial ankle. The larger wounds on the right also looks somewhat better in terms of epithelialization although I think they could benefit from an aggressive debridement if plastic surgery would be willing to do that. Perhaps placement of Integra or a cell 07/26/2021: She saw Dr. Claudia Desanctis yesterday. He raised the question as to whether or not this might be pyoderma and wanted to wait until that question was answered by dermatology before proceeding with any sort of operative debridement. We have continue to use Santyl under silver alginate with Kerlix and Coban wraps. Overall, her wounds appear to be continuing to contract and epithelialize, with some granulation tissue present. There continues to be some slough on all wound surfaces. 08/09/2021: She has  not been able to get an appointment with dermatology because apparently the offices in [place] do not accept Medicaid. She is looking into whether or not she can be seen at the main [facility] dermatology clinic. This is necessary because plastic surgery is concerned that her wounds might represent pyoderma and they did not want to do any procedure until that was clarified. We have been using Santyl under silver alginate with Kerlix and Coban wraps. Today, there was a greater amount of drainage on her dressings with a slight green discoloration and significant odor. Despite this, her wounds continue to contract and epithelialize. There is pale granulation tissue present and actually, on the left medial ankle, the granulation tissue is a bit hypertrophic. 08/16/2021: Last week, I took a culture and this grew back rare methicillin-resistant Staph aureus and rare corynebacterium. The MRSA was sensitive to gentamicin which we began applying topically on an empiric basis. This week, her wounds are a bit smaller and the drainage and odor are less. Her primary care provider is working on assisting the patient with a dermatology evaluation. She has been in silver alginate over the gentamicin that was started last week along with Kerlix and Coban wraps. 08/23/2021: Because she has Medicaid, we have been unable to get her into see any dermatologist in the [place] to rule out pyoderma gangrenosum, which was a requirement from plastic surgery prior to any sort of debridement and grafting. Despite this, however, all of her wounds continue to get smaller. The wound on her left medial ankle is nearly closed. There is no odor from the wounds, although she still accumulates a modest amount of drainage on her dressings. 08/30/2021: The lateral right ankle wound and the medial left ankle wound are a bit smaller today. The medial right ankle wound is about the same size. They are less tender. We have  still been unable to get her into dermatology. 09/06/2021: All of the wounds are about the same size today. She continues to endorse minimal pain. I communicated with Dr. Claudia Desanctis in plastic surgery regarding our issues getting a dermatology appointment; he was out of town but indicated that he would look into perhaps performing the biopsy in his office and will have his office contact her. 09/14/2021: The patient has an appointment in dermatology, but it is not until October. Her wounds are roughly the same; she continues to have very thick purulent-looking drainage on her dressings. 09/20/2021: The left medial wound is nearly closed and just has a bit of accumulated eschar on the surface. The right medial and lateral ankle wounds are perhaps a little bit smaller. They continue to have a very pale surface  with accumulation of thin slough. 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 I did not think [employer] was indicated based on this. She is getting topical mupirocin with Prisma silver collagen. 10/04/2021: The patient was not seen in clinic last week due to childcare coverage issues. In the interim, the left medial leg wound has closed. The right sided leg wounds are smaller. There is more granulation tissue coming through, particularly on the lateral wound. The surface remains somewhat gritty. We have been applying topical mupirocin and Prisma silver collagen. 10/11/2021: The left medial leg wound remains closed. She does complain of some anesthetic sensation to the area. Both of the right-sided leg wounds are smaller but still have accumulated slough. 10/18/2021: Both right-sided leg wounds are minimally smaller this week. She still continues to accumulate slough and has thick drainage on her dressings. 10/23/2021: Both wounds continue to contract. There is still slough buildup. She has been approved for a keratin-based skin substitute trial product but it will not be available until  next week. 10/30/2021: The wounds are about the same to perhaps slightly smaller. There is still continued slough buildup. Unfortunately, the rep for the keratin based product did not show up today and did not answer his phone when called. 11/08/2021: The wounds are little bit smaller today. She continues to have thick drainage but the surfaces are relatively clean with just a little bit of slough accumulation. She reported to me today that she is unable to completely flex her left ankle and on examination it seems this is potentially related to scar tissue from her wounds. We do have the [employer] trial product available for her today. 11/15/2021: Both wounds are smaller today. There is some slough accumulation on the surfaces, but the medial wound, in particular looks like it is filling in and is less deep. She did hear from physical therapy and she is going to start working with them on July 11. She is here for her second application of the trial skin substitute, ProgenaMatrix. 11/22/2021: Both wounds continue to contract, the medial more dramatically than the lateral. Both wounds have a layer of slough on the surface, but underneath this, the gritty fibrous tissue has a little bit more of a pink cast to it rather than being as pale as it has been. 11/29/2021: The wounds are roughly the same size this week, perhaps a millimeter or 2 smaller. The medial wound has filled in and is nearly flush with the surrounding skin surface. She continues to have a lot of slough accumulation on both surfaces. 12/06/2021: No significant change to her wounds, but she has a new opening on her dorsal foot, just distal to the right lateral ankle wound. The area on her left medial ankle that reopened looks a little bit larger today. She has quite a bit of pain associated with the new wound. 12/12/2021: Her wounds look about the same but the new opening on her right lateral dorsal foot is a little bit bigger. She continues  to have a fair amount of pain with this wound. 12/26/2021: The left medial ankle wound is tiny and superficial. She has 2 areas of crusting on her left lateral ankle, however, that appear to be threatening to open again. Her right medial ankle wound is a little bit smaller today but still continues to accumulate thick rubbery slough. The new dorsal foot wound is exquisitely painful but there is no odor or purulent drainage. No erythema or induration. The right  lateral ankle wound looks about the same today, again with thick rubbery slough. 01/03/2022: The left medial ankle wound has closed again. Both right ankle wounds appear to be about the same size with thick rubbery slough. The dorsal foot wound on the right continues to be quite painful and she stated that she did not want any debridement of that site today. 01/10/2022: No real change to any of her wounds. She continues to accumulate thick slough. The dorsal foot wound has merged with the lateral malleolar wound. She is experiencing significant pain in the dorsal foot portion of the ulcer. 01/16/2022: Absolutely no change or progress in her wounds. 01/24/2022: Her wounds are unchanged. She continues to build up slough and the wounds on her dorsal right foot are still exquisitely tender. 01/31/2022: The wounds actually measure a little bit narrower today. They still have thick slough on the surface but the underlying tissue seems a little less fibrotic. We changed to Iodosorb last week. 02/07/2022: Wounds continue to slowly epithelialize around the parameters. She has less pain today. She still accumulates a fairly substantial layer of slough. Electronic Signature(s) Signed: 02/07/2022 1:54:20 PM By: Fredirick Maudlin MD FACS Entered By: Fredirick Maudlin on 02/07/2022 13:54:20 -------------------------------------------------------------------------------- Physical Exam Details Patient Name: Date of Service: [name] [name], [name] 02/07/2022 12:30  PM Medical Record Number: [id number] Patient Account Number: [id number] Date of Birth/Sex: Treating RN: 07/07/1970 (51 y.o. [name] Primary Care Provider: [name] Other Clinician: Referring Provider: Treating Provider/Extender: Elyse Jarvis Weeks in Treatment: 42 Constitutional . . . . No acute distress.Marland Kitchen Respiratory Normal work of breathing on room air.. Notes 02/07/2022: Wounds continue to slowly epithelialize around the parameters. She has less pain today. She still accumulates a fairly substantial layer of slough. Electronic Signature(s) Signed: 02/07/2022 2:09:25 PM By: Fredirick Maudlin MD FACS Previous Signature: 02/07/2022 1:54:56 PM Version By: Fredirick Maudlin MD FACS Entered By: Fredirick Maudlin on 02/07/2022 14:09:25 -------------------------------------------------------------------------------- Physician Orders Details Patient Name: Date of Service: [name] [name], [name] 02/07/2022 12:30 PM Medical Record Number: [id number] Patient Account Number: [id number] Date of Birth/Sex: Treating RN: Jan 31, 1971 (51 y.o. [name] Primary Care Provider: [name] Other Clinician: Referring Provider: Treating Provider/Extender: Elyse Jarvis Weeks in Treatment: 36 Verbal / Phone Orders: No Diagnosis Coding ICD-10 Coding Code Description L97.818 Non-pressure chronic ulcer of other part of right lower leg with other specified severity 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 Follow-up Appointments ppointment in 1 week. - Dr. Celine Ahr Room 2 Friday 02/15/22 AND Room 3 on Thursday 02/21/22 at 10:15am Return A Anesthetic (In clinic) Topical Lidocaine 5% applied to wound bed (In clinic) Topical Lidocaine 4% applied to wound bed - In Clinic Bathing/ Shower/ Hygiene May shower with protection but do not get wound dressing(s) wet. - Can get cast protector bags at [facility] or [facility] Edema Control - Lymphedema / SCD /  Other Elevate legs to the level of the heart or above for 30 minutes daily and/or when sitting, a frequency of: - throughout the day Avoid standing for long periods of time. Exercise regularly Additional Orders / Instructions Follow Nutritious Diet Wound Treatment Wound #17 - Lower Leg Wound Laterality: Right, Lateral Cleanser: Soap and Water Every Other Day/30 Days Discharge Instructions: May shower and wash wound with dial antibacterial soap and water prior to dressing change. Cleanser: Wound Cleanser Every Other Day/30 Days Discharge Instructions: Cleanse the wound with wound cleanser prior to applying a clean dressing using gauze sponges, not tissue or  cotton balls. Peri-Wound Care: Triamcinolone 15 (g) Every Other Day/30 Days Discharge Instructions: Use triamcinolone 15 (g) as directed Peri-Wound Care: Sween Lotion (Moisturizing lotion) Every Other Day/30 Days Discharge Instructions: Apply moisturizing lotion as directed Secondary Dressing: ABD Pad, 5x9 Every Other Day/30 Days Discharge Instructions: Apply over primary dressing as directed. Secondary Dressing: Zetuvit Plus 4x8 in Every Other Day/30 Days Discharge Instructions: Apply over primary dressing as directed. Secured With: Elastic Bandage 4 inch (ACE bandage) Every Other Day/30 Days Discharge Instructions: Secure with ACE bandage as directed. Compression Wrap: Kerlix Roll 4.5x3.1 (in/yd) Every Other Day/30 Days Discharge Instructions: Apply Kerlix and Coban compression as directed. Wound #21 - Ankle Wound Laterality: Right, Medial Cleanser: Soap and Water Every Other Day/30 Days Discharge Instructions: May shower and wash wound with dial antibacterial soap and water prior to dressing change. Cleanser: Wound Cleanser Every Other Day/30 Days Discharge Instructions: Cleanse the wound with wound cleanser prior to applying a clean dressing using gauze sponges, not tissue or cotton balls. Peri-Wound Care: Triamcinolone 15 (g)  Every Other Day/30 Days Discharge Instructions: Use triamcinolone 15 (g) as directed Peri-Wound Care: Sween Lotion (Moisturizing lotion) Every Other Day/30 Days Discharge Instructions: Apply moisturizing lotion as directed Secondary Dressing: ABD Pad, 5x9 Every Other Day/30 Days Discharge Instructions: Apply over primary dressing as directed. Secondary Dressing: Zetuvit Plus 4x8 in Every Other Day/30 Days Discharge Instructions: Apply over primary dressing as directed. Secured With: Elastic Bandage 4 inch (ACE bandage) Every Other Day/30 Days Discharge Instructions: Secure with ACE bandage as directed. Compression Wrap: Kerlix Roll 4.5x3.1 (in/yd) Every Other Day/30 Days Discharge Instructions: Apply Kerlix and Coban compression as directed. Electronic Signature(s) Signed: 02/07/2022 3:00:56 PM By: Fredirick Maudlin MD FACS Entered By: Fredirick Maudlin on 02/07/2022 14:09:51 -------------------------------------------------------------------------------- Problem List Details Patient Name: Date of Service: [name] [name], [name] 02/07/2022 12:30 PM Medical Record Number: [id number] Patient Account Number: [id number] Date of Birth/Sex: Treating RN: 07-Mar-1971 (51 y.o. [name] Primary Care Provider: [name] Other Clinician: Referring Provider: Treating Provider/Extender: Elyse Jarvis Weeks in Treatment: 74 Active Problems ICD-10 Encounter Code Description Active Date MDM Diagnosis L97.818 Non-pressure chronic ulcer of other part of right lower leg with other specified 04/17/2021 No Yes severity 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 04/17/2021 No Yes Inactive Problems ICD-10 Code Description Active Date Inactive Date L97.828 Non-pressure chronic ulcer of other part of left lower leg with other specified severity 04/17/2021 04/17/2021 Resolved Problems Electronic Signature(s) Signed: 02/07/2022 1:53:15 PM By: Fredirick Maudlin MD FACS Entered By:  Fredirick Maudlin on 02/07/2022 13:53:15 -------------------------------------------------------------------------------- Progress Note Details Patient Name: Date of Service: [name] [name], [name] 02/07/2022 12:30 PM Medical Record Number: [id number] Patient Account Number: [id number] Date of Birth/Sex: Treating RN: 09/30/1970 (51 y.o. [name] Primary Care Provider: [name] Other Clinician: Referring Provider: Treating Provider/Extender: Elyse Jarvis Weeks in Treatment: 64 Subjective Chief Complaint Information obtained from Patient the patient is here for evaluation of her bilateral lower extremity sickle cell ulcers 04/17/2021; patient comes in for substantial wounds on the right and left lower leg History of Present Illness (HPI) The following HPI elements were documented for the patient's wound: Location: medial and lateral ankle region on the right and left medial malleolus Quality: Patient reports experiencing a shooting pain to affected area(s). Severity: Patient states wound(s) are getting worse. Duration: right lower extremity bimalleolar ulcers have been present for approximately 2 years; the rright meedial malleolus ulcer has been there proximally 6 months Timing: Pain in wound  is constant (hurts all the time) Context: The wound would happen gradually Associated Signs and Symptoms: Patient reports having increase discharge. 51 year old patient with a history of sickle cell anemia who was last seen by me with ulceration of the right lower extremity above the ankle and was referred to Dr. Leland Johns for a surgical debridement as I was unable to do anything in the office due to excruciating pain. At that stage she was referred from the plastic surgery service to dermatology who treated her for a skin infection with doxycycline and then Levaquin and a local antibiotic ointment. I understand the patient has since developed ulceration on the left  ankle both medial and lateral and was now referred back to the wound center as dermatology has finished the management. I do not have any notes from the dermatology department Old notes: 51 year old patient with a history of sickle cell anemia, pain bilateral lower extremities, right lower extremity ulcer and has a history of receiving a skin graft( Theraskin) several months ago. She has been visiting the wound center [facility] and was seen by Dr. Dellia Nims and Dr. Leland Johns. after prolonged conservator therapy between July 2016 and January 2017. She had been seen by the plastic surgeon and taken to the OR for debridement and application of Theraskin. She had 3 applications of Theraskin and was then treated with collagen. Prior to that she had a history of similar problems in 2014 and was treated conservatively. Had a reflux study done for the right lower extremity in August 2016 without reflux or DVT . Past medical history significant for sickle cell disease, anemia, leg ulcers, cholelithiasis,and has never been a smoker. Once the patient was discharged on the wound center she says within 2 or 3 weeks the problems recurred and she has been treating it conservatively. since I saw her 3 weeks ago at [facility] she has been unable to get her dressing material but has completed a course of doxycycline. 6/7/ 2017 -- lower extremity venous duplex reflux evaluation was done oo No evidence of SVT or DVT in the RLL. No venous incompetence in the RLL. No further vascular workup is indicated at this time. She was seen by Dr. [name], on 10/04/2015. She agreed with the plan of taking her to the OR for debridement and application of theraskin and would also take biopsies to rule out pyoderma gangrenosum. Follow-up note dated May 31 received and she was status post application of Theraskin to multiple ulcers around the right ankle. Pathology did not show evidence of malignancy or pyoderma gangrenosum. She  would continue to see as in the wound clinic for further care and see Dr. Leland Johns as needed. The patient brought the biopsy report and it was consistent with stasis ulcer no evidence of malignancy and the comment was that there was some adjacent neovascularization, fibrosis and patchy perivascular chronic inflammation. 11/15/2015 -- today we applied her first application of Theraskin 11/30/15; TheraSkin #2 12/13/2015 -- she is having a lot of pain locally and is here for possible application of a theraskin today. 01/16/2016 -- the patient has significant pain and has noticed despite in spite of all local care and oral pain medication. It is impossible to debride her in the office. 02/06/2016 -- I do not see any notes from Dr. Iran Planas( the patient has not made a call to the office know as she heard from them) and the only visit to recently was with her PCP Dr. Danella Penton -- I saw her on 01/16/2016  and prescribed 90 tablets of oxycodone 10 mg and did lab work and screening for HIV. the HIV was negative and hemoglobin was 6.3 with a WBC count of 14.9 and hematocrit of 17.8 with platelets of 561. 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 The bimalleolar ulcers to the right lower extremity have been present for approximately 2 years, the left medial malleolus ulcer has been present approximately 6 months. She has followed with Dr.Thimmappa in the past and has had a total of 3 applications of Theraskin (01/2015, 09/2015, 06/17/16). She has also followed with Dr. Con Memos here in the clinic and has received 2 applications of TheraSkin (11/10/15, 11/30/15). The patient does experience chronic, and is not amenable to debridement. 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 She is not currently on any antibiotic therapy and has not been treated with any  recently. 07/17/2016 -- was seen by Dr. Iran Planas of plastic surgery who saw her 2 weeks postop application of Theraskin #3. She had removed her dressing and asked her to apply silver alginate on alternate days and follow-up back with the wound center. Future debridements and application of skin substitute would have to be done in the hospital due to her high risk for anesthesia. READMISSION 04/17/2021 Patient is now a 51 year old woman that we have had in this clinic for a prolonged period of time and 2016-2017 and then again for 2 visits in February 2018. At that point she had wounds on the right lower leg predominantly medial. She had also been seen by plastic surgery Dr. Leland Johns who I believe took her to the OR for operative debridement and application of TheraSkin in 2017. After she left our clinic she was followed for a very prolonged period of time in the wound care center in [facility] who then referred her ultimately to [facility] where she was seen by Dr. Vernona Rieger. Again taken her to the OR for skin grafting which apparently did not take. She had multiple other attempts at dressings although I have not really looked over all of these notes in great detail. She has not been seen in a wound care center in about a year. She states over the last year in addition to her right lower leg she has developed wounds on the left lower leg quite extensive. She is using Xeroform to all of these wounds without really any improvement. She also has Medicaid which does not cover wound products. The patient has had vascular work-ups in the past including most recently on 03/28/2021 showing biphasic waveforms on the right triphasic at the PTA and biphasic at the dorsalis pedis on the left. She was unable to tolerate any degree of compression to do ABIs. Unfortunately TBI's were also not done. She had venous reflux studies done in 2017. This did not show any evidence of a DVT or SVT and no venous incompetence was  noted in the right leg at the time this was the only side with the wound As noted I did not look all over her old records. She apparently had a course of HBO and [facility] although I am not sure what the indication would have been. In any case she developed seizures and terminated treatment earlier. She is generally much more disabled than when we last saw her in clinic. She can no longer walk pretty much wheelchair-bound because predominantly of pain  in the left hip. 04/24/2021; the patient tolerated the wraps we put on. We used Santyl and Hydrofera Blue under compression. I brought her back for a nurse visit for a change in dressing. With Medicaid we will have a hard time getting anything paid for and hence the need for compression. She arrives in clinic with all the wounds looking somewhat better in terms of surface 12/20; circumferential wound on the right from the lateral to the medial. She has open areas on the left medial and left lateral x2 on all of this with the same surface. This does not look completely healthy although she does have some epithelialization. She is not complaining of a lot of pain which is unusual for her sickle ulcers. I have not looked over her extensive records from [facility]. She had recent arterial studies and has a history of venous reflux studies I will need to look these over although I do not believe she has significant arterial disease 2023 05/22/2021; patient's wound areas measure slightly smaller. Still a lot of drainage coming from the right we have been using Hydrofera Blue and Santyl with some improvement in the wound surfaces. She tells me she will be getting transfused later in the week for her underlying sickle cell anemia I have looked over her recent arterial studies which were done in the fall. This was in November and showed biphasic and triphasic waveforms but she could not tolerate ABIs because of pressure and unfortunately TBI's were not done. She has  not had recent venous reflux studies that I can see 1/10; not much change about the same surface area. This has a yellowish surface to it very gritty. We have been using Santyl and Hydrofera Blue for a prolonged period. Culture I did last week showed methicillin sensitive staph aureus "rare". Our intake nurse reports greenish drainage which may be the Hydrofera Blue itself 1/17; wounds are continue to measure smaller although I am not sure about the accuracy here. Especially the areas on the right are covered in what looks to be a nonviable surface although she does have some epithelialization. Similarly she has areas on the left medial and left lateral ankle area which appear to have a better surface and perhaps are slightly smaller. We have been using Santyl and Hydrofera Blue. She cannot tolerate mechanical debridements She went for her reflux studies which showed significant reflux at the greater saphenous vein at the saphenofemoral junction as well as the greater saphenous vein in the proximal calf on the left she had reflux in the thigh and the common femoral vein and supra vein Fishel vein reflux in the greater saphenous vein. I will have vein and vascular look at this. My thoughts have been that these are likely sickle wounds. I looked through her old records from [facility] wound care center and then when she graduated to [facility] wound care center where she saw Dr. Zigmund Daniel and Dr. Vernona Rieger. Although I can see she had reflux studies done I do not see that she actually saw a vein and vascular. I went over the fact that she had operative debridements and actual skin grafting that did not take. I do not think these wounds have ever really progressed towards healing 1/31;Substantial wounds on the right ankle area. Hyper granulated very gritty adherent debris on the surface. 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 On the left she has no DVT or SVT deep vein reflux in the common femoral vein. Superficial vein reflux in the greater saphenous vein on although the vein diameter is not really all that large. I do not think there is anything that can be done with these although I am going to send her for consultation to vein and vascular. 2/7; Wound exam; substantial wound area on the right posterior ankle area and areas on the left medial ankle and left lateral ankle. I was able to debride the left medial ankle last week fairly aggressively and it is back this week to a completely nonviable surface She will see vascular surgery this Friday and I would like them to review the venous studies and also any comments on her arterial status. If they do not see an issue here I am going to refer her to plastic surgery for an operative debridement perhaps intraoperative ACell or Integra. Eventually she will require a deep tissue culture again 2/14; substantial wound area on the right posterior ankle, medial ankle. We have been using silver alginate The patient was seen by vein and vascular she had both venous reflux studies and arterial studies. 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 She had no evidence of superficial venous thrombosis. She did have superficial vein reflux at the saphenofemoral junction and the greater saphenous vein. On the left no evidence of a DVT no evidence of superficial venous throat thrombosis she did have deep vein reflux in the common femoral vein and superficial vein reflux in the greater saphenous vein but these were not felt to be  amenable to ablation. In terms of arterial studies she had triphasic and wife biphasic wave waveforms bilaterally not felt to have a significant arterial issue. I do not get the feeling that they felt that any part of her nonhealing wounds were related to either arterial or venous issues. They did note that she had venous reflux at the right at the [facility] and [facility]. And also on the left there were reflux in the deep system at the common femoral vein and greater saphenous vein in the proximal thigh. Nothing amenable to ablation. 2/20; she is making some decent progress on the right where there is nice skin between the 2 open areas on the right ankle. The surfaces here do not look viable yet there is some surrounding epithelialization. She still has a small area on the left medial ankle area. Hyper-granulated Jody's away always 2/28 patient has an appointment with plastic surgery on 3/8. We will see her back on 3/9. She may have to call us to get the area redressed. We've been using Santyl under silver alginate. We made a nice improvement on the left medial ankle. The larger wounds on the right also looks somewhat better in terms of epithelialization although I think they could benefit from an aggressive debridement if plastic surgery would be willing to do that. Perhaps placement of Integra or a cell 07/26/2021: She saw Dr. Claudia Desanctis yesterday. He raised the question as to whether or not this might be pyoderma and wanted to wait until that question was answered by dermatology before proceeding  with any sort of operative debridement. We have continue to use Santyl under silver alginate with Kerlix and Coban wraps. Overall, her wounds appear to be continuing to contract and epithelialize, with some granulation tissue present. There continues to be some slough on all wound surfaces. 08/09/2021: She has not been able to get an appointment with dermatology because apparently the offices in [place] do  not accept Medicaid. She is looking into whether or not she can be seen at the main [facility] dermatology clinic. This is necessary because plastic surgery is concerned that her wounds might represent pyoderma and they did not want to do any procedure until that was clarified. We have been using Santyl under silver alginate with Kerlix and Coban wraps. Today, there was a greater amount of drainage on her dressings with a slight green discoloration and significant odor. Despite this, her wounds continue to contract and epithelialize. There is pale granulation tissue present and actually, on the left medial ankle, the granulation tissue is a bit hypertrophic. 08/16/2021: Last week, I took a culture and this grew back rare methicillin-resistant Staph aureus and rare corynebacterium. The MRSA was sensitive to gentamicin which we began applying topically on an empiric basis. This week, her wounds are a bit smaller and the drainage and odor are less. Her primary care provider is working on assisting the patient with a dermatology evaluation. She has been in silver alginate over the gentamicin that was started last week along with Kerlix and Coban wraps. 08/23/2021: Because she has Medicaid, we have been unable to get her into see any dermatologist in the [place] to rule out pyoderma gangrenosum, which was a requirement from plastic surgery prior to any sort of debridement and grafting. Despite this, however, all of her wounds continue to get smaller. The wound on her left medial ankle is nearly closed. There is no odor from the wounds, although she still accumulates a modest amount of drainage on her dressings. 08/30/2021: The lateral right ankle wound and the medial left ankle wound are a bit smaller today. The medial right ankle wound is about the same size. They are less tender. We have still been unable to get her into dermatology. 09/06/2021: All of the wounds are about the same size today. She  continues to endorse minimal pain. I communicated with Dr. Claudia Desanctis in plastic surgery regarding our issues getting a dermatology appointment; he was out of town but indicated that he would look into perhaps performing the biopsy in his office and will have his office contact her. 09/14/2021: The patient has an appointment in dermatology, but it is not until October. Her wounds are roughly the same; she continues to have very thick purulent-looking drainage on her dressings. 09/20/2021: The left medial wound is nearly closed and just has a bit of accumulated eschar on the surface. The right medial and lateral ankle wounds are perhaps a little bit smaller. They continue to have a very pale surface with accumulation of thin slough. 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 I did not think [employer] was indicated based on this. She is getting topical mupirocin with Prisma silver collagen. 10/04/2021: The patient was not seen in clinic last week due to childcare coverage issues. In the interim, the left medial leg wound has closed. The right sided leg wounds are smaller. There is more granulation tissue coming through, particularly on the lateral wound. The surface remains somewhat gritty. We have been applying  topical mupirocin and Prisma silver collagen. 10/11/2021: The left medial leg wound remains closed. She does complain of some anesthetic sensation to the area. Both of the right-sided leg wounds are smaller but still have accumulated slough. 10/18/2021: Both right-sided leg wounds are minimally smaller this week. She still continues to accumulate slough and has thick drainage on her dressings. 10/23/2021: Both wounds continue to contract. There is still slough buildup. She has been approved for a keratin-based skin substitute trial product but it will not be available until next week. 10/30/2021: The wounds are about the same to perhaps slightly smaller. There is still continued slough  buildup. Unfortunately, the rep for the keratin based product did not show up today and did not answer his phone when called. 11/08/2021: The wounds are little bit smaller today. She continues to have thick drainage but the surfaces are relatively clean with just a little bit of slough accumulation. She reported to me today that she is unable to completely flex her left ankle and on examination it seems this is potentially related to scar tissue from her wounds. We do have the ProgenaMatrix trial product available for her today. 11/15/2021: Both wounds are smaller today. There is some slough accumulation on the surfaces, but the medial wound, in particular looks like it is filling in and is less deep. She did hear from physical therapy and she is going to start working with them on July 11. She is here for her second application of the trial skin substitute, ProgenaMatrix. 11/22/2021: Both wounds continue to contract, the medial more dramatically than the lateral. Both wounds have a layer of slough on the surface, but underneath this, the gritty fibrous tissue has a little bit more of a pink cast to it rather than being as pale as it has been. 11/29/2021: The wounds are roughly the same size this week, perhaps a millimeter or 2 smaller. The medial wound has filled in and is nearly flush with the surrounding skin surface. She continues to have a lot of slough accumulation on both surfaces. 12/06/2021: No significant change to her wounds, but she has a new opening on her dorsal foot, just distal to the right lateral ankle wound. The area on her left medial ankle that reopened looks a little bit larger today. She has quite a bit of pain associated with the new wound. 12/12/2021: Her wounds look about the same but the new opening on her right lateral dorsal foot is a little bit bigger. She continues to have a fair amount of pain with this wound. 12/26/2021: The left medial ankle wound is tiny and superficial. She  has 2 areas of crusting on her left lateral ankle, however, that appear to be threatening to open again. Her right medial ankle wound is a little bit smaller today but still continues to accumulate thick rubbery slough. The new dorsal foot wound is exquisitely painful but there is no odor or purulent drainage. No erythema or induration. The right lateral ankle wound looks about the same today, again with thick rubbery slough. 01/03/2022: The left medial ankle wound has closed again. Both right ankle wounds appear to be about the same size with thick rubbery slough. The dorsal foot wound on the right continues to be quite painful and she stated that she did not want any debridement of that site today. 01/10/2022: No real change to any of her wounds. She continues to accumulate thick slough. The dorsal foot wound has merged with the lateral malleolar wound.  She is experiencing significant pain in the dorsal foot portion of the ulcer. 01/16/2022: Absolutely no change or progress in her wounds. 01/24/2022: Her wounds are unchanged. She continues to build up slough and the wounds on her dorsal right foot are still exquisitely tender. 01/31/2022: The wounds actually measure a little bit narrower today. They still have thick slough on the surface but the underlying tissue seems a little less fibrotic. We changed to Iodosorb last week. 02/07/2022: Wounds continue to slowly epithelialize around the parameters. She has less pain today. She still accumulates a fairly substantial layer of slough. Patient History Information obtained from Patient. Family History Diabetes - Mother, Lung Disease - Mother, No family history of Cancer, Heart Disease, Hereditary Spherocytosis, Hypertension, Kidney Disease, Seizures, Stroke, Thyroid Problems, Tuberculosis. Social History Never smoker, Marital Status - Married, Alcohol Use - Never, Drug Use - No History, Caffeine Use - Daily. Medical History Eyes Denies history of  Cataracts, Glaucoma, Optic Neuritis Ear/Nose/Mouth/Throat Denies history of Chronic sinus problems/congestion, Middle ear problems Hematologic/Lymphatic Patient has history of Anemia, Sickle Cell Disease Denies history of Hemophilia, Human Immunodeficiency Virus, Lymphedema Respiratory Denies history of Aspiration, Asthma, Chronic Obstructive Pulmonary Disease (COPD), Pneumothorax, Sleep Apnea, Tuberculosis Cardiovascular Denies history of Angina, Arrhythmia, Congestive Heart Failure, Coronary Artery Disease, Deep Vein Thrombosis, Hypertension, Hypotension, Myocardial Infarction, Peripheral Arterial Disease, Peripheral Venous Disease, Phlebitis, Vasculitis Gastrointestinal Denies history of Cirrhosis , Colitis, Crohnoos, Hepatitis A, Hepatitis B, Hepatitis C Endocrine Denies history of Type I Diabetes, Type II Diabetes Genitourinary Denies history of End Stage Renal Disease Immunological Denies history of Lupus Erythematosus, Raynaudoos, Scleroderma Integumentary (Skin) Denies history of History of Burn Musculoskeletal Denies history of Gout, Rheumatoid Arthritis, Osteoarthritis, Osteomyelitis Neurologic Patient has history of Neuropathy - right foot intermittant Denies history of Dementia, Quadriplegia, Paraplegia, Seizure Disorder Oncologic Denies history of Received Chemotherapy, Received Radiation Psychiatric Denies history of Anorexia/bulimia, Confinement Anxiety Hospitalization/Surgery History - c section x2. - left breast lumpectomy. - iandD right ankle with theraskin. Medical A Surgical History Notes nd Constitutional Symptoms (General Health) H/O miscarriage Cardiovascular bradycardia Gastrointestinal cholilithiasis Objective Constitutional No acute distress.. Vitals Time Taken: 12:53 PM, Height: 67 in, Temperature: 98.4 F, Pulse: 90 bpm, Respiratory Rate: 16 breaths/min, Blood Pressure: 135/70 mmHg. Respiratory Normal work of breathing on room air.. General  Notes: 02/07/2022: Wounds continue to slowly epithelialize around the parameters. She has less pain today. She still accumulates a fairly substantial layer of slough. Integumentary (Hair, Skin) Wound #17 status is Open. Original cause of wound was Gradually Appeared. The date acquired was: 10/05/2012. The wound has been in treatment 42 weeks. The wound is located on the Right,Lateral Lower Leg. The wound measures 8cm length x 4.8cm width x 0.1cm depth; 30.159cm^2 area and 3.016cm^3 volume. There is Fat Layer (Subcutaneous Tissue) exposed. There is no tunneling or undermining noted. There is a large amount of serosanguineous drainage noted. The wound margin is distinct with the outline attached to the wound base. There is small (1-33%) pink, pale granulation within the wound bed. There is a large (67-100%) amount of necrotic tissue within the wound bed including Eschar and Adherent Slough. Wound #21 status is Open. Original cause of wound was Gradually Appeared. The date acquired was: 06/26/2021. The wound has been in treatment 32 weeks. The wound is located on the Right,Medial Ankle. The wound measures 5.4cm length x 3cm width x 0.1cm depth; 12.723cm^2 area and 1.272cm^3 volume. There is Fat Layer (Subcutaneous Tissue) exposed. There is no tunneling or undermining noted. There is  a medium amount of serosanguineous drainage noted. The wound margin is distinct with the outline attached to the wound base. There is medium (34-66%) pink granulation within the wound bed. There is a medium (34- 66%) amount of necrotic tissue within the wound bed including Eschar and Adherent Slough. 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 There was a Selective/Open Wound Non-Viable  Tissue Debridement with a total area of 38.4 sq cm performed by Fredirick Maudlin, MD. With the following instrument(s): Curette to remove Non-Viable tissue/material. Material removed includes [facility] after achieving pain control using Lidocaine 4% Topical Solution. No specimens were taken. A time out was conducted at 13:35, prior to the start of the procedure. A Minimum amount of bleeding was controlled with Pressure. The procedure was tolerated well with a pain level of 0 throughout and a pain level of 0 following the procedure. Post Debridement Measurements: 8cm length x 4.8cm width x 0.1cm depth; 3.016cm^3 volume. Character of Wound/Ulcer Post Debridement is improved. Post procedure Diagnosis Wound #17: Same as Pre-Procedure General Notes: Scribed for Dr.Marios Gaiser by J.[name]. Wound #21 Pre-procedure diagnosis of Wound #21 is a Sickle Cell Lesion located on the Right,Medial Ankle .Severity of Tissue Pre Debridement is: Fat layer exposed. There was a Selective/Open Wound Non-Viable Tissue Debridement with a total area of 16.2 sq cm performed by Fredirick Maudlin, MD. With the following instrument(s): Curette to remove Non-Viable tissue/material. Material removed includes [facility] after achieving pain control using Lidocaine 4% Topical Solution. No specimens were taken. A time out was conducted at 13:35, prior to the start of the procedure. A Minimum amount of bleeding was controlled with Pressure. The procedure was tolerated well with a pain level of 0 throughout and a pain level of 0 following the procedure. Post Debridement Measurements: 5.4cm length x 3cm width x 0.1cm depth; 1.272cm^3 volume. Character of Wound/Ulcer Post Debridement is improved. Severity of Tissue Post Debridement is: Fat layer exposed. Post procedure Diagnosis Wound #21: Same as Pre-Procedure General Notes: Scribed for Dr.Twania Bujak by J.[name]. Plan Follow-up Appointments: Return Appointment in 1 week. - Dr. Celine Ahr Room 2 Friday  02/15/22 AND Room 3 on Thursday 02/21/22 at 10:15am Anesthetic: (In clinic) Topical Lidocaine 5% applied to wound bed (In clinic) Topical Lidocaine 4% applied to wound bed - In Clinic Bathing/ Shower/ Hygiene: May shower with protection but do not get wound dressing(s) wet. - Can get cast protector bags at [facility] or [facility] Edema Control - Lymphedema / SCD / Other: Elevate legs to the level of the heart or above for 30 minutes daily and/or when sitting, a frequency of: - throughout the day Avoid standing for long periods of time. Exercise regularly Additional Orders / Instructions: Follow Nutritious Diet WOUND #17: - Lower Leg Wound Laterality: Right, Lateral Cleanser: Soap and Water Every Other Day/30 Days Discharge Instructions: May shower and wash wound with dial antibacterial soap and water prior to dressing change. Cleanser: Wound Cleanser Every Other Day/30 Days Discharge Instructions: Cleanse the wound with wound cleanser prior to applying a clean dressing using gauze sponges, not tissue or cotton balls. Peri-Wound Care: Triamcinolone 15 (g) Every Other Day/30 Days Discharge Instructions: Use triamcinolone 15 (g) as directed Peri-Wound Care: Sween Lotion (Moisturizing lotion) Every Other Day/30 Days Discharge Instructions: Apply moisturizing lotion as directed Secondary Dressing: ABD Pad, 5x9 Every  Other Day/30 Days Discharge Instructions: Apply over primary dressing as directed. Secondary Dressing: Zetuvit Plus 4x8 in Every Other Day/30 Days Discharge Instructions: Apply over primary dressing as directed. Secured With: Elastic Bandage 4 inch (ACE bandage) Every Other Day/30 Days Discharge Instructions: Secure with ACE bandage as directed. Com pression Wrap: Kerlix Roll 4.5x3.1 (in/yd) Every Other Day/30 Days Discharge Instructions: Apply Kerlix and Coban compression as directed. WOUND #21: - Ankle Wound Laterality: Right, Medial Cleanser: Soap and Water Every Other Day/30  Days Discharge Instructions: May shower and wash wound with dial antibacterial soap and water prior to dressing change. Cleanser: Wound Cleanser Every Other Day/30 Days Discharge Instructions: Cleanse the wound with wound cleanser prior to applying a clean dressing using gauze sponges, not tissue or cotton balls. Peri-Wound Care: Triamcinolone 15 (g) Every Other Day/30 Days Discharge Instructions: Use triamcinolone 15 (g) as directed Peri-Wound Care: Sween Lotion (Moisturizing lotion) Every Other Day/30 Days Discharge Instructions: Apply moisturizing lotion as directed Secondary Dressing: ABD Pad, 5x9 Every Other Day/30 Days Discharge Instructions: Apply over primary dressing as directed. Secondary Dressing: Zetuvit Plus 4x8 in Every Other Day/30 Days Discharge Instructions: Apply over primary dressing as directed. Secured With: Elastic Bandage 4 inch (ACE bandage) Every Other Day/30 Days Discharge Instructions: Secure with ACE bandage as directed. Com pression Wrap: Kerlix Roll 4.5x3.1 (in/yd) Every Other Day/30 Days Discharge Instructions: Apply Kerlix and Coban compression as directed. 02/07/2022: Wounds continue to slowly epithelialize around the parameters. She has less pain today. She still accumulates a fairly substantial layer of slough. I used a curette to debride slough from her wounds. We will continue using the Iodosorb, which seems to be having an excellent effect on her wounds. Continue Kerlix and Ace bandage wrapping. Follow-up in 1 week. Electronic Signature(s) Signed: 02/07/2022 2:10:37 PM By: Fredirick Maudlin MD FACS Entered By: Fredirick Maudlin on 02/07/2022 14:10:37 -------------------------------------------------------------------------------- HxROS Details Patient Name: Date of Service: [name] [name], [name] 02/07/2022 12:30 PM Medical Record Number: [id number] Patient Account Number: [id number] Date of Birth/Sex: Treating RN: 1970-09-02 (52 y.o. [name] Primary Care Provider: [name] Other Clinician: Referring Provider: Treating Provider/Extender: Elyse Jarvis Weeks in Treatment: 22 Information Obtained From Patient Constitutional Symptoms (General Health) Medical History: Past Medical History Notes: H/O miscarriage Eyes Medical History: Negative for: Cataracts; Glaucoma; Optic Neuritis Ear/Nose/Mouth/Throat Medical History: Negative for: Chronic sinus problems/congestion; Middle ear problems Hematologic/Lymphatic Medical History: Positive for: Anemia; Sickle Cell Disease Negative for: Hemophilia; Human Immunodeficiency Virus; Lymphedema Respiratory Medical History: Negative for: Aspiration; Asthma; Chronic Obstructive Pulmonary Disease (COPD); Pneumothorax; Sleep Apnea; Tuberculosis Cardiovascular Medical History: Negative for: Angina; Arrhythmia; Congestive Heart Failure; Coronary Artery Disease; Deep Vein Thrombosis; Hypertension; Hypotension; Myocardial Infarction; Peripheral Arterial Disease; Peripheral Venous Disease; Phlebitis; Vasculitis Past Medical History Notes: bradycardia Gastrointestinal Medical History: Negative for: Cirrhosis ; Colitis; Crohns; Hepatitis A; Hepatitis B; Hepatitis C Past Medical History Notes: cholilithiasis Endocrine Medical History: Negative for: Type I Diabetes; Type II Diabetes Genitourinary Medical History: Negative for: End Stage Renal Disease Immunological Medical History: Negative for: Lupus Erythematosus; Raynauds; Scleroderma Integumentary (Skin) Medical History: Negative for: History of Burn Musculoskeletal Medical History: Negative for: Gout; Rheumatoid Arthritis; Osteoarthritis; Osteomyelitis Neurologic Medical History: Positive for: Neuropathy - right foot intermittant Negative for: Dementia; Quadriplegia; Paraplegia; Seizure Disorder Oncologic Medical History: Negative for: Received Chemotherapy; Received  Radiation Psychiatric Medical History: Negative for: Anorexia/bulimia; Confinement Anxiety Immunizations Pneumococcal Vaccine: Received Pneumococcal Vaccination: No Implantable Devices None Hospitalization / Surgery History Type of Hospitalization/Surgery c section x2 left breast  lumpectomy iandD right ankle with theraskin Family and Social History Cancer: No; Diabetes: Yes - Mother; Heart Disease: No; Hereditary Spherocytosis: No; Hypertension: No; Kidney Disease: No; Lung Disease: Yes - Mother; Seizures: No; Stroke: No; Thyroid Problems: No; Tuberculosis: No; Never smoker; Marital Status - Married; Alcohol Use: Never; Drug Use: No History; Caffeine Use: Daily; Financial Concerns: No; Food, Clothing or Shelter Needs: No; Support System Lacking: No; Transportation Concerns: No Engineer, maintenance) Signed: 02/07/2022 3:00:56 PM By: Fredirick Maudlin MD FACS Signed: 02/07/2022 5:27:48 PM By: Dellie Catholic RN Entered By: Fredirick Maudlin on 02/07/2022 13:54:39 -------------------------------------------------------------------------------- SuperBill Details Patient Name: Date of Service: [place], [name] 02/07/2022 Medical Record Number: [id number] Patient Account Number: [id number] Date of Birth/Sex: Treating RN: 05/17/1971 (51 y.o. 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 Facility Procedures CPT4 Code: [id number] Description: [phone] - DEBRIDE WOUND 1ST 20 SQ CM OR < ICD-10 Diagnosis Description L97.818 Non-pressure chronic ulcer of other part of right lower leg with other specified s Modifier: everity Quantity: 1 CPT4 Code: [id number] Description: 16579 -  DEBRIDE WOUND EA ADDL 20 SQ CM ICD-10 Diagnosis Description L97.818 Non-pressure chronic ulcer of other part of right lower leg with other specified s Modifier: everity Quantity: 2 Physician Procedures : CPT4 Code Description Modifier [id number] - WC PHYS LEVEL 3 - EST PT 25 ICD-10 Diagnosis Description L97.818 Non-pressure chronic ulcer of other part of right lower leg with other specified severity 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

## 2022-02-12 ENCOUNTER — Ambulatory Visit: Payer: Medicaid Other | Admitting: Family Medicine

## 2022-02-15 ENCOUNTER — Telehealth: Payer: Self-pay

## 2022-02-15 ENCOUNTER — Other Ambulatory Visit: Payer: Self-pay | Admitting: Family Medicine

## 2022-02-15 ENCOUNTER — Encounter (HOSPITAL_BASED_OUTPATIENT_CLINIC_OR_DEPARTMENT_OTHER): Payer: Medicaid Other | Admitting: General Surgery

## 2022-02-15 DIAGNOSIS — D571 Sickle-cell disease without crisis: Secondary | ICD-10-CM

## 2022-02-15 DIAGNOSIS — G894 Chronic pain syndrome: Secondary | ICD-10-CM

## 2022-02-15 DIAGNOSIS — L97818 Non-pressure chronic ulcer of other part of right lower leg with other specified severity: Secondary | ICD-10-CM | POA: Diagnosis not present

## 2022-02-15 MED ORDER — OXYCODONE HCL 10 MG PO TABS: 10.0000 mg | ORAL_TABLET | ORAL | 0 refills | Status: DC

## 2022-02-15 MED ORDER — IBUPROFEN 600 MG PO TABS: 600.0000 mg | ORAL_TABLET | Freq: Four times a day (QID) | ORAL | 3 refills | Status: DC | PRN

## 2022-02-15 NOTE — Progress Notes (Signed)
Reviewed PDMP substance reporting system prior to prescribing opiate medications. No inconsistencies noted.  Meds ordered this encounter  Medications   ibuprofen (ADVIL) 600 MG tablet    Sig: Take 1 tablet (600 mg total) by mouth every 6 (six) hours as needed for mild pain.    Dispense:  30 tablet    Refill:  3    Order Specific Question:   Supervising Provider    Answer:   JEGEDE, OLUGBEMIGA E [[id number]]   Oxycodone HCl 10 MG TABS    Sig: Take 1 tablet (10 mg total) by mouth every 4 (four) hours while awake.    Dispense:  90 tablet    Refill:  0    Order Specific Question:   Supervising Provider    Answer:   JEGEDE, OLUGBEMIGA E [[id number]]   [name] Blansett Moore Vyctoria Dickman  APRN, MSN, FNP-C [facility] [address]  [place], [place] [address] [phone]  

## 2022-02-15 NOTE — Progress Notes (Signed)
LYNDA, WANNINGER ([id number]) Visit Report for 02/15/2022 Arrival Information Details Patient Name: Date of Service: [name] 02/15/2022 10:00 A M Medical Record Number: [id number] Patient Account Number: [id number] Date of Birth/Sex: Treating RN: [birth year] (51 y.o. Donney Rankins, Lovena Le Primary Care Mikael Skoda: [name] Other Clinician: Referring Lakota Markgraf: Treating Jaquana Geiger/Extender: Elyse Jarvis Weeks in Treatment: 61 Visit Information History Since Last Visit Added or deleted any medications: No Patient Arrived: [name] Any new allergies or adverse reactions: No Arrival Time: 10:25 Had a fall or experienced change in No Accompanied By: self activities of daily living that may affect Transfer Assistance: None risk of falls: Patient Identification Verified: Yes Signs or symptoms of abuse/neglect since last visito No Secondary Verification Process Completed: Yes Hospitalized since last visit: No Patient Requires Transmission-Based Precautions: No Implantable device outside of the clinic excluding No Patient Has Alerts: No cellular tissue based products placed in the center since last visit: Has Dressing in Place as Prescribed: Yes Has Compression in Place as Prescribed: Yes Pain Present Now: Yes Electronic Signature(s) Signed: 02/15/2022 4:46:26 PM By: Adline Peals Entered By: Adline Peals on 02/15/2022 10:29:27 -------------------------------------------------------------------------------- Encounter Discharge Information Details Patient Name: Date of Service: [name], [name] 02/15/2022 10:00 A M Medical Record Number: [id number] Patient Account Number: [id number] Date of Birth/Sex: Treating RN: 1971-01-29 (51 y.o. [name] Primary Care Bruno Leach: [name] Other Clinician: Referring Naveah Brave: Treating Tuff Clabo/Extender: Elyse Jarvis Weeks in Treatment: 66 Encounter Discharge Information  Items Post Procedure Vitals Discharge Condition: Stable Temperature (F): 98.4 Ambulatory Status: Cane Pulse (bpm): 88 Discharge Destination: Home Respiratory Rate (breaths/min): 16 Transportation: Private Auto Blood Pressure (mmHg): 136/55 Accompanied By: self Schedule Follow-up Appointment: Yes Clinical Summary of Care: Patient Declined Electronic Signature(s) Signed: 02/15/2022 4:46:26 PM By: Adline Peals Entered By: Adline Peals on 02/15/2022 11:19:40 -------------------------------------------------------------------------------- Lower Extremity Assessment Details Patient Name: Date of Service: [name], [place] [name] 02/15/2022 10:00 A M Medical Record Number: [id number] Patient Account Number: [id number] Date of Birth/Sex: Treating RN: Feb 21, 1971 (51 y.o. [name] Primary Care Marquelle Musgrave: [name] Other Clinician: Referring Bard Haupert: Treating Emaad Nanna/Extender: Elyse Jarvis Weeks in Treatment: 43 Edema Assessment Assessed: [Left: No] [Right: No] Edema: [Left: Ye] [Right: s] Calf Left: Right: Point of Measurement: 33 cm From Medial Instep 28.3 cm 33.8 cm Ankle Left: Right: Point of Measurement: 10 cm From Medial Instep 19.8 cm 23.8 cm Vascular Assessment Pulses: Dorsalis Pedis Palpable: [Right:Yes] Electronic Signature(s) Signed: 02/15/2022 4:46:26 PM By: Adline Peals Entered By: Adline Peals on 02/15/2022 10:35:38 -------------------------------------------------------------------------------- Multi Wound Chart Details Patient Name: Date of Service: [name], [name] 02/15/2022 10:00 A M Medical Record Number: [id number] Patient Account Number: [id number] Date of Birth/Sex: Treating RN: 1971-04-28 (51 y.o. F) Primary Care Tandi Hanko: [name] Other Clinician: Referring Jackye Dever: Treating Lashanda Storlie/Extender: Elyse Jarvis Weeks in Treatment: 16 Vital Signs Height(in):  67 Pulse(bpm): 88 Weight(lbs): Blood Pressure(mmHg): 136/55 Body Mass Index(BMI): Temperature(F): 98.4 Respiratory Rate(breaths/min): 16 Photos: [N/A:N/A] Right, Lateral Lower Leg Right, Medial Ankle N/A Wound Location: Gradually Appeared Gradually Appeared N/A Wounding Event: Sickle Cell Lesion Sickle Cell Lesion N/A Primary Etiology: N/A Venous Leg Ulcer N/A Secondary Etiology: Anemia, Sickle Cell Disease, Anemia, Sickle Cell Disease, N/A Comorbid History: Neuropathy Neuropathy 10/05/2012 06/26/2021 N/A Date Acquired: 17 33 N/A Weeks of Treatment: Open Open N/A Wound Status: No No N/A Wound Recurrence: Yes No N/A Clustered Wound: 6.4x7.4x0.1 7.5x3x0.1 N/A Measurements L x W x D (cm) 37.196 17.671 N/A A (  cm) : rea 3.72 1.767 N/A Volume (cm) : 80.20% 39.00% N/A % Reduction in A rea: 80.20% 39.00% N/A % Reduction in Volume: Full Thickness Without Exposed Full Thickness Without Exposed N/A Classification: Support Structures Support Structures Large Medium N/A Exudate A mount: Serosanguineous Serosanguineous N/A Exudate Type: red, brown red, brown N/A Exudate Color: Distinct, outline attached Distinct, outline attached N/A Wound Margin: Small (1-33%) Small (1-33%) N/A Granulation A mount: Pink, Pale Pink N/A Granulation Quality: Large (67-100%) Large (67-100%) N/A Necrotic A mount: Eschar, Adherent Slough Eschar, Adherent Slough N/A Necrotic Tissue: Fat Layer (Subcutaneous Tissue): Yes Fat Layer (Subcutaneous Tissue): Yes N/A Exposed Structures: Fascia: No Fascia: No Tendon: No Tendon: No Muscle: No Muscle: No Joint: No Joint: No Bone: No Bone: No Small (1-33%) Small (1-33%) N/A Epithelialization: Debridement - Selective/Open Wound Debridement - Selective/Open Wound N/A Debridement: Pre-procedure Verification/Time Out 10:47 10:47 N/A Taken: Lidocaine 4% Topical Solution Lidocaine 4% Topical Solution N/A Pain Control: [employer]  N/A Tissue Debrided: Non-Viable Tissue Non-Viable Tissue N/A Level: 14 12 N/A Debridement A (sq cm): rea Curette Curette N/A Instrument: Minimum Minimum N/A Bleeding: Pressure Pressure N/A Hemostasis A chieved: 5 5 N/A Procedural Pain: 0 0 N/A Post Procedural Pain: Procedure was tolerated well Procedure was tolerated well N/A Debridement Treatment Response: 6.4x7.4x0.1 7.5x3x0.1 N/A Post Debridement Measurements L x W x D (cm) 3.72 1.767 N/A Post Debridement Volume: (cm) Debridement Debridement N/A Procedures Performed: Treatment Notes Electronic Signature(s) Signed: 02/15/2022 11:15:17 AM By: Fredirick Maudlin MD FACS Entered By: Fredirick Maudlin on 02/15/2022 11:15:17 -------------------------------------------------------------------------------- Multi-Disciplinary Care Plan Details Patient Name: Date of Service: [name], [name] 02/15/2022 10:00 A M Medical Record Number: [id number] Patient Account Number: [id number] Date of Birth/Sex: Treating RN: 1970/09/19 (51 y.o. [name] Primary Care Rashena Dowling: [name] Other Clinician: Referring Rheba Diamond: Treating Nero Sawatzky/Extender: Elyse Jarvis Weeks in Treatment: 29 Multidisciplinary Care Plan reviewed with physician Active Inactive Venous Leg Ulcer Nursing Diagnoses: Actual venous Insuffiency (use after diagnosis is confirmed) Knowledge deficit related to disease process and management Goals: Patient will maintain optimal edema control Date Initiated: 06/26/2021 Target Resolution Date: 05/19/2022 Goal Status: Active Interventions: Assess peripheral edema status every visit. Compression as ordered Treatment Activities: Therapeutic compression applied : 06/26/2021 Notes: Wound/Skin Impairment Nursing Diagnoses: Impaired tissue integrity Goals: Patient/caregiver will verbalize understanding of skin care regimen Date Initiated: 04/17/2021 Target Resolution Date:  05/19/2022 Goal Status: Active Ulcer/skin breakdown will have a volume reduction of 30% by week 4 Date Initiated: 04/17/2021 Date Inactivated: 05/29/2021 Target Resolution Date: 05/15/2021 Goal Status: Met Ulcer/skin breakdown will have a volume reduction of 50% by week 8 Date Initiated: 05/29/2021 Date Inactivated: 06/26/2021 Target Resolution Date: 06/26/2021 Goal Status: Unmet Unmet Reason: venous reflux Interventions: Assess patient/caregiver ability to obtain necessary supplies Assess patient/caregiver ability to perform ulcer/skin care regimen upon admission and as needed Assess ulceration(s) every visit Provide education on ulcer and skin care Treatment Activities: Topical wound management initiated : 04/17/2021 Notes: 06/08/21: Left leg wounds greater than 30% volume reduction, right leg acute infection. Electronic Signature(s) Signed: 02/15/2022 4:46:26 PM By: Adline Peals Entered By: Adline Peals on 02/15/2022 10:41:53 -------------------------------------------------------------------------------- Pain Assessment Details Patient Name: Date of Service: [name], [place] [name] 02/15/2022 10:00 A M Medical Record Number: [id number] Patient Account Number: [id number] Date of Birth/Sex: Treating RN: 11-Nov-1970 (51 y.o. [name] Primary Care Kynzlee Hucker: [name] Other Clinician: Referring Zaylia Riolo: Treating Kinser Fellman/Extender: Elyse Jarvis Weeks in Treatment: 8 Active Problems Location of Pain Severity and Description  of Pain Patient Has Paino Yes Site Locations Pain Location: Pain Location: Pain in Ulcers Duration of the Pain. Constant / Intermittento Intermittent Rate the pain. Current Pain Level: 4 Pain Management and Medication Current Pain Management: Electronic Signature(s) Signed: 02/15/2022 4:46:26 PM By: Adline Peals Entered By: Adline Peals on 02/15/2022  10:29:38 -------------------------------------------------------------------------------- Patient/Caregiver Education Details Patient Name: Date of Service: [name], [name] 9/29/2023andnbsp10:00 A M Medical Record Number: [id number] Patient Account Number: [id number] Date of Birth/Gender: Treating RN: Jun 07, 1970 (51 y.o. [name] Primary Care Physician: [name] Other Clinician: Referring Physician: Treating Physician/Extender: Elyse Jarvis Weeks in Treatment: 54 Education Assessment Education Provided To: Patient Education Topics Provided Wound/Skin Impairment: Methods: Explain/Verbal Responses: Reinforcements needed, State content correctly Electronic Signature(s) Signed: 02/15/2022 4:46:26 PM By: Adline Peals Entered By: Adline Peals on 02/15/2022 10:42:05 -------------------------------------------------------------------------------- Wound Assessment Details Patient Name: Date of Service: [name], [name] 02/15/2022 10:00 A M Medical Record Number: [id number] Patient Account Number: [id number] Date of Birth/Sex: Treating RN: 05/20/1971 (51 y.o. [name] Primary Care Ghali Morissette: [name] Other Clinician: Referring Shauna Bodkins: Treating Ritvik Mczeal/Extender: Elyse Jarvis Weeks in Treatment: 57 Wound Status Wound Number: 17 Primary Etiology: Sickle Cell Lesion Wound Location: Right, Lateral Lower Leg Wound Status: Open Wounding Event: Gradually Appeared Comorbid History: Anemia, Sickle Cell Disease, Neuropathy Date Acquired: 10/05/2012 Weeks Of Treatment: 43 Clustered Wound: Yes Photos Wound Measurements Length: (cm) 6.4 Width: (cm) 7.4 Depth: (cm) 0.1 Area: (cm) 37.196 Volume: (cm) 3.72 % Reduction in Area: 80.2% % Reduction in Volume: 80.2% Epithelialization: Small (1-33%) Tunneling: No Wound Description Classification: Full Thickness Without Exposed Support  Structures Wound Margin: Distinct, outline attached Exudate Amount: Large Exudate Type: Serosanguineous Exudate Color: red, brown Foul Odor After Cleansing: No Slough/Fibrino Yes Wound Bed Granulation Amount: Small (1-33%) Exposed Structure Granulation Quality: Pink, Pale Fascia Exposed: No Necrotic Amount: Large (67-100%) Fat Layer (Subcutaneous Tissue) Exposed: Yes Necrotic Quality: Eschar, Adherent Slough Tendon Exposed: No Muscle Exposed: No Joint Exposed: No Bone Exposed: No Treatment Notes Wound #17 (Lower Leg) Wound Laterality: Right, Lateral Cleanser Soap and Water Discharge Instruction: May shower and wash wound with dial antibacterial soap and water prior to dressing change. Wound Cleanser Discharge Instruction: Cleanse the wound with wound cleanser prior to applying a clean dressing using gauze sponges, not tissue or cotton balls. Peri-Wound Care Triamcinolone 15 (g) Discharge Instruction: Use triamcinolone 15 (g) as directed Sween Lotion (Moisturizing lotion) Discharge Instruction: Apply moisturizing lotion as directed Topical Primary Dressing IODOFLEX 0.9% Cadexomer Iodine Pad 4x6 cm Discharge Instruction: Apply to wound bed as instructed Secondary Dressing ABD Pad, 5x9 Discharge Instruction: Apply over primary dressing as directed. Zetuvit Plus 4x8 in Discharge Instruction: Apply over primary dressing as directed. Secured With Elastic Bandage 4 inch (ACE bandage) Discharge Instruction: Secure with ACE bandage as directed. Compression Wrap Kerlix Roll 4.5x3.1 (in/yd) Discharge Instruction: Apply Kerlix and Coban compression as directed. Compression Stockings Add-Ons Electronic Signature(s) Signed: 02/15/2022 4:46:26 PM By: Adline Peals Entered By: Adline Peals on 02/15/2022 10:38:28 -------------------------------------------------------------------------------- Wound Assessment Details Patient Name: Date of Service: [name], [name]  02/15/2022 10:00 A M Medical Record Number: [id number] Patient Account Number: [id number] Date of Birth/Sex: Treating RN: Jun 03, 1970 (51 y.o. [name] Primary Care Carolan Avedisian: [name] Other Clinician: Referring Miley Blanchett: Treating Donella Pascarella/Extender: Elyse Jarvis Weeks in Treatment: 23 Wound Status Wound Number: 21 Primary Etiology: Sickle Cell Lesion Wound Location: Right, Medial Ankle Secondary Etiology: Venous Leg Ulcer Wounding Event: Gradually Appeared  Wound Status: Open Date Acquired: 06/26/2021 Comorbid History: Anemia, Sickle Cell Disease, Neuropathy Weeks Of Treatment: 33 Clustered Wound: No Photos Wound Measurements Length: (cm) 7.5 Width: (cm) 3 Depth: (cm) 0.1 Area: (cm) 17.671 Volume: (cm) 1.767 % Reduction in Area: 39% % Reduction in Volume: 39% Epithelialization: Small (1-33%) Tunneling: No Undermining: No Wound Description Classification: Full Thickness Without Exposed Support Structures Wound Margin: Distinct, outline attached Exudate Amount: Medium Exudate Type: Serosanguineous Exudate Color: red, brown Foul Odor After Cleansing: No Slough/Fibrino Yes Wound Bed Granulation Amount: Small (1-33%) Exposed Structure Granulation Quality: Pink Fascia Exposed: No Necrotic Amount: Large (67-100%) Fat Layer (Subcutaneous Tissue) Exposed: Yes Necrotic Quality: Eschar, Adherent Slough Tendon Exposed: No Muscle Exposed: No Joint Exposed: No Bone Exposed: No Treatment Notes Wound #21 (Ankle) Wound Laterality: Right, Medial Cleanser Soap and Water Discharge Instruction: May shower and wash wound with dial antibacterial soap and water prior to dressing change. Wound Cleanser Discharge Instruction: Cleanse the wound with wound cleanser prior to applying a clean dressing using gauze sponges, not tissue or cotton balls. Peri-Wound Care Triamcinolone 15 (g) Discharge Instruction: Use triamcinolone 15 (g) as directed Sween  Lotion (Moisturizing lotion) Discharge Instruction: Apply moisturizing lotion as directed Topical Primary Dressing IODOFLEX 0.9% Cadexomer Iodine Pad 4x6 cm Discharge Instruction: Apply to wound bed as instructed Secondary Dressing ABD Pad, 5x9 Discharge Instruction: Apply over primary dressing as directed. Zetuvit Plus 4x8 in Discharge Instruction: Apply over primary dressing as directed. Secured With Elastic Bandage 4 inch (ACE bandage) Discharge Instruction: Secure with ACE bandage as directed. Compression Wrap Kerlix Roll 4.5x3.1 (in/yd) Discharge Instruction: Apply Kerlix and Coban compression as directed. Compression Stockings Add-Ons Electronic Signature(s) Signed: 02/15/2022 4:46:26 PM By: Adline Peals Entered By: Adline Peals on 02/15/2022 10:38:51 -------------------------------------------------------------------------------- Vitals Details Patient Name: Date of Service: [name], [name] 02/15/2022 10:00 A M Medical Record Number: [id number] Patient Account Number: [id number] Date of Birth/Sex: Treating RN: 11-09-1970 (51 y.o. [name] Primary Care Kemiya Batdorf: [name] Other Clinician: Referring Broadus Costilla: Treating Sabriyah Wilcher/Extender: Elyse Jarvis Weeks in Treatment: 39 Vital Signs Time Taken: 10:29 Temperature (F): 98.4 Height (in): 67 Pulse (bpm): 88 Respiratory Rate (breaths/min): 16 Blood Pressure (mmHg): 136/55 Reference Range: 80 - 120 mg / dl Electronic Signature(s) Signed: 02/15/2022 4:46:26 PM By: Adline Peals Entered By: Adline Peals on 02/15/2022 10:30:28

## 2022-02-15 NOTE — Telephone Encounter (Signed)
Oxycodone  Ibuprofen  

## 2022-02-15 NOTE — Progress Notes (Signed)
[name], [name] ([id number]) Visit Report for 02/15/2022 Chief Complaint Document Details Patient Name: Date of Service: [name] [name] 02/15/2022 10:00 A M Medical Record Number: [id number] Patient Account Number: [id number] Date of Birth/Sex: Treating RN: [birth year] (51 y.o. F) Primary Care Provider: [name] Other Clinician: Referring Provider: Treating Provider/Extender: Elyse Jarvis Weeks in Treatment: 58 Information Obtained from: Patient Chief Complaint the patient is here for evaluation of her bilateral lower extremity sickle cell ulcers 04/17/2021; patient comes in for substantial wounds on the right and left lower leg Electronic Signature(s) Signed: 02/15/2022 11:15:26 AM By: Fredirick Maudlin MD FACS Entered By: Fredirick Maudlin on 02/15/2022 11:15:26 -------------------------------------------------------------------------------- Debridement Details Patient Name: Date of Service: [name] 02/15/2022 10:00 A M Medical Record Number: [id number] Patient Account Number: [id number] Date of Birth/Sex: Treating RN: 20-Apr-1971 (51 y.o. [name] Primary Care Provider: [name] Other Clinician: Referring Provider: Treating Provider/Extender: Elyse Jarvis Weeks in Treatment: 43 Debridement Performed for Assessment: Wound #17 Right,Lateral Lower Leg Performed By: Physician Fredirick Maudlin, MD Debridement Type: Debridement Level of Consciousness (Pre-procedure): Awake and Alert Pre-procedure Verification/Time Out Yes - 10:47 Taken: Start Time: 10:47 Pain Control: Lidocaine 4% T opical Solution T Area Debrided (L x W): otal 4 (cm) x 3.5 (cm) = 14 (cm) Tissue and other material debrided: Non-Viable, Slough, Slough Level: Non-Viable Tissue Debridement Description: Selective/Open Wound Instrument: Curette Bleeding: Minimum Hemostasis Achieved: Pressure Procedural Pain: 5 Post Procedural Pain:  0 Response to Treatment: Procedure was tolerated well Level of Consciousness (Post- Awake and Alert procedure): Post Debridement Measurements of Total Wound Length: (cm) 6.4 Width: (cm) 7.4 Depth: (cm) 0.1 Volume: (cm) 3.72 Character of Wound/Ulcer Post Debridement: Improved Post Procedure Diagnosis Same as Pre-procedure Notes scribed for Dr. Celine Ahr by Adline Peals, RN Electronic Signature(s) Signed: 02/15/2022 12:16:21 PM By: Fredirick Maudlin MD FACS Signed: 02/15/2022 4:46:26 PM By: Adline Peals Entered By: Adline Peals on 02/15/2022 10:48:52 -------------------------------------------------------------------------------- Debridement Details Patient Name: Date of Service: [name] 02/15/2022 10:00 A M Medical Record Number: [id number] Patient Account Number: [id number] Date of Birth/Sex: Treating RN: 19-May-1971 (51 y.o. [name] Primary Care Provider: [name] Other Clinician: Referring Provider: Treating Provider/Extender: Elyse Jarvis Weeks in Treatment: 43 Debridement Performed for Assessment: Wound #21 Right,Medial Ankle Performed By: Physician Fredirick Maudlin, MD Debridement Type: Debridement Severity of Tissue Pre Debridement: Fat layer exposed Level of Consciousness (Pre-procedure): Awake and Alert Pre-procedure Verification/Time Out Yes - 10:47 Taken: Start Time: 10:47 Pain Control: Lidocaine 4% T opical Solution T Area Debrided (L x W): otal 4 (cm) x 3 (cm) = 12 (cm) Tissue and other material debrided: Non-Viable, Slough, Slough Level: Non-Viable Tissue Debridement Description: Selective/Open Wound Instrument: Curette Bleeding: Minimum Hemostasis Achieved: Pressure Procedural Pain: 5 Post Procedural Pain: 0 Response to Treatment: Procedure was tolerated well Level of Consciousness (Post- Awake and Alert procedure): Post Debridement Measurements of Total Wound Length: (cm) 7.5 Width:  (cm) 3 Depth: (cm) 0.1 Volume: (cm) 1.767 Character of Wound/Ulcer Post Debridement: Improved Severity of Tissue Post Debridement: Fat layer exposed Post Procedure Diagnosis Same as Pre-procedure Notes scribed for Dr. Celine Ahr by Adline Peals, RN Electronic Signature(s) Signed: 02/15/2022 12:16:21 PM By: Fredirick Maudlin MD FACS Signed: 02/15/2022 4:46:26 PM By: Adline Peals Entered By: Adline Peals on 02/15/2022 10:49:06 -------------------------------------------------------------------------------- HPI Details Patient Name: Date of Service: [name] 02/15/2022 10:00 A M Medical Record Number: [id number] Patient Account Number: [id number] Date of Birth/Sex: Treating RN:  [birth year] (51 y.o. F) Primary Care Provider: [name] Other Clinician: Referring Provider: Treating Provider/Extender: Elyse Jarvis Weeks in Treatment: 84 History of Present Illness Location: medial and lateral ankle region on the right and left medial malleolus Quality: Patient reports experiencing a shooting pain to affected area(s). Severity: Patient states wound(s) are getting worse. Duration: right lower extremity bimalleolar ulcers have been present for approximately 2 years; the rright meedial malleolus ulcer has been there proximally 6 months Timing: Pain in wound is constant (hurts all the time) Context: The wound would happen gradually ssociated Signs and Symptoms: Patient reports having increase discharge. A HPI Description: 51 year old patient with a history of sickle cell anemia who was last seen by me with ulceration of the right lower extremity above the ankle and was referred to Dr. Leland Johns for a surgical debridement as I was unable to do anything in the office due to excruciating pain. At that stage she was referred from the plastic surgery service to dermatology who treated her for a skin infection with doxycycline and then Levaquin and a local  antibiotic ointment. I understand the patient has since developed ulceration on the left ankle both medial and lateral and was now referred back to the wound center as dermatology has finished the management. I do not have any notes from the dermatology department Old notes: 51 year old patient with a history of sickle cell anemia, pain bilateral lower extremities, right lower extremity ulcer and has a history of receiving a skin graft( Theraskin) several months ago. She has been visiting the wound center [facility] and was seen by Dr. Dellia Nims and Dr. Leland Johns. after prolonged conservator therapy between July 2016 and January 2017. She had been seen by the plastic surgeon and taken to the OR for debridement and application of Theraskin. She had 3 applications of Theraskin and was then treated with collagen. Prior to that she had a history of similar problems in 2014 and was treated conservatively. Had a reflux study done for the right lower extremity in August 2016 without reflux or DVT . Past medical history significant for sickle cell disease, anemia, leg ulcers, cholelithiasis,and has never been a smoker. Once the patient was discharged on the wound center she says within 2 or 3 weeks the problems recurred and she has been treating it conservatively. since I saw her 3 weeks ago at [facility] she has been unable to get her dressing material but has completed a course of doxycycline. 6/7/ 2017 -- lower extremity venous duplex reflux evaluation was done No evidence of SVT or DVT in the RLL. No venous incompetence in the RLL. No further vascular workup is indicated at this time. She was seen by Dr. [name], on 10/04/2015. She agreed with the plan of taking her to the OR for debridement and application of theraskin and would also take biopsies to rule out pyoderma gangrenosum. Follow-up note dated May 31 received and she was status post application of Theraskin to multiple ulcers around the  right ankle. Pathology did not show evidence of malignancy or pyoderma gangrenosum. She would continue to see as in the wound clinic for further care and see Dr. Leland Johns as needed. The patient brought the biopsy report and it was consistent with stasis ulcer no evidence of malignancy and the comment was that there was some adjacent neovascularization, fibrosis and patchy perivascular chronic inflammation. 11/15/2015 -- today we applied her first application of Theraskin 11/30/15; TheraSkin #2 12/13/2015 -- she is having a lot of pain  locally and is here for possible application of a theraskin today. 01/16/2016 -- the patient has significant pain and has noticed despite in spite of all local care and oral pain medication. It is impossible to debride her in the office. 02/06/2016 -- I do not see any notes from Dr. Iran Planas( the patient has not made a call to the office know as she heard from them) and the only visit to recently was with her PCP Dr. Danella Penton -- I saw her on 01/16/2016 and prescribed 90 tablets of oxycodone 10 mg and did lab work and screening for HIV. the HIV was negative and hemoglobin was 6.3 with a WBC count of 14.9 and hematocrit of 17.8 with platelets of 561. 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 The bimalleolar ulcers to the right lower extremity have been present for approximately 2 years, the left medial malleolus ulcer has been present approximately 6 months. She has followed with Dr.Thimmappa in the past and has had a total of 3 applications of Theraskin (01/2015, 09/2015, 06/17/16). She has also followed with Dr. Con Memos here in the clinic and has received 2 applications of TheraSkin (11/10/15, 11/30/15). The patient does experience chronic, and is not amenable to debridement. She had a sickle cell crisis in December 2017, prior to that has been several  years. She is not currently on any antibiotic therapy and has not been treated with any recently. 07/17/2016 -- was seen by Dr. Iran Planas of plastic surgery who saw her 2 weeks postop application of Theraskin #3. She had removed her dressing and asked her to apply silver alginate on alternate days and follow-up back with the wound center. Future debridements and application of skin substitute would have to be done in the hospital due to her high risk for anesthesia. READMISSION 04/17/2021 Patient is now a 51 year old woman that we have had in this clinic for a prolonged period of time and 2016-2017 and then again for 2 visits in February 2018. At that point she had wounds on the right lower leg predominantly medial. She had also been seen by plastic surgery Dr. Leland Johns who I believe took her to the OR for operative debridement and application of TheraSkin in 2017. After she left our clinic she was followed for a very prolonged period of time in the wound care center in [facility] who then referred her ultimately to [facility] where she was seen by Dr. Vernona Rieger. Again taken her to the OR for skin grafting which apparently did not take. She had multiple other attempts at dressings although I have not really looked over all of these notes in great detail. She has not been seen in a wound care center in about a year. She states over the last year in addition to her right lower leg she has developed wounds on the left lower leg quite extensive. She is using Xeroform to all of these wounds without really any improvement. She also has Medicaid which does not cover wound products. The patient has had vascular work-ups in the past including most recently on 03/28/2021 showing biphasic waveforms on the right triphasic at the PTA and biphasic at the dorsalis pedis on the left. She was unable to tolerate any degree of compression to do ABIs. Unfortunately TBI's were also not done. She had venous reflux studies done in  2017. This did not show any evidence of a DVT or SVT and  no venous incompetence was noted in the right leg at the time this was the only side with the wound As noted I did not look all over her old records. She apparently had a course of HBO and [facility] although I am not sure what the indication would have been. In any case she developed seizures and terminated treatment earlier. She is generally much more disabled than when we last saw her in clinic. She can no longer walk pretty much wheelchair-bound because predominantly of pain in the left hip. 04/24/2021; the patient tolerated the wraps we put on. We used Santyl and Hydrofera Blue under compression. I brought her back for a nurse visit for a change in dressing. With Medicaid we will have a hard time getting anything paid for and hence the need for compression. She arrives in clinic with all the wounds looking somewhat better in terms of surface 12/20; circumferential wound on the right from the lateral to the medial. She has open areas on the left medial and left lateral x2 on all of this with the same surface. This does not look completely healthy although she does have some epithelialization. She is not complaining of a lot of pain which is unusual for her sickle ulcers. I have not looked over her extensive records from [facility]. She had recent arterial studies and has a history of venous reflux studies I will need to look these over although I do not believe she has significant arterial disease 2023 05/22/2021; patient's wound areas measure slightly smaller. Still a lot of drainage coming from the right we have been using Hydrofera Blue and Santyl with some improvement in the wound surfaces. She tells me she will be getting transfused later in the week for her underlying sickle cell anemia I have looked over her recent arterial studies which were done in the fall. This was in November and showed biphasic and triphasic waveforms but she could not  tolerate ABIs because of pressure and unfortunately TBI's were not done. She has not had recent venous reflux studies that I can see 1/10; not much change about the same surface area. This has a yellowish surface to it very gritty. We have been using Santyl and Hydrofera Blue for a prolonged period. Culture I did last week showed methicillin sensitive staph aureus "rare". Our intake nurse reports greenish drainage which may be the Hydrofera Blue itself 1/17; wounds are continue to measure smaller although I am not sure about the accuracy here. Especially the areas on the right are covered in what looks to be a nonviable surface although she does have some epithelialization. Similarly she has areas on the left medial and left lateral ankle area which appear to have a better surface and perhaps are slightly smaller. We have been using Santyl and Hydrofera Blue. She cannot tolerate mechanical debridements She went for her reflux studies which showed significant reflux at the greater saphenous vein at the saphenofemoral junction as well as the greater saphenous vein in the proximal calf on the left she had reflux in the thigh and the common femoral vein and supra vein Fishel vein reflux in the greater saphenous vein. I will have vein and vascular look at this. My thoughts have been that these are likely sickle wounds. I looked through her old records from [facility] wound care center and then when she graduated to [facility] wound care center where she saw Dr. Zigmund Daniel and Dr. Vernona Rieger. Although I can see she had reflux studies  done I do not see that she actually saw a vein and vascular. I went over the fact that she had operative debridements and actual skin grafting that did not take. I do not think these wounds have ever really progressed towards healing 1/31;Substantial wounds on the right ankle area. Hyper granulated very gritty adherent debris on the surface. 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 On the left she has no DVT or SVT deep vein reflux in the common femoral vein. Superficial vein reflux in the greater saphenous vein on although the vein diameter is not really all that large. I do not think there is anything that can be done with these although I am going to send her for consultation to vein and vascular. 2/7; Wound exam; substantial wound area on the right posterior ankle area and areas on the left medial ankle and left lateral ankle. I was able to debride the left medial ankle last week fairly aggressively and it is back this week to a completely nonviable surface She will see vascular surgery this Friday and I would like them to review the venous studies and also any comments on her arterial status. If they do not see an issue here I am going to refer her to plastic surgery for an operative debridement perhaps intraoperative ACell or Integra. Eventually she will require a deep tissue culture again 2/14; substantial wound area on the right posterior ankle, medial ankle. We have been using silver alginate The patient was seen by vein and vascular she had both venous reflux studies and arterial studies. 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 She had no evidence of superficial venous thrombosis. She did have superficial vein reflux at the saphenofemoral junction and the greater saphenous vein. On the left no evidence of a DVT no evidence of superficial venous throat thrombosis she did have deep vein reflux in the common femoral vein and  superficial vein reflux in the greater saphenous vein but these were not felt to be amenable to ablation. In terms of arterial studies she had triphasic and wife biphasic wave waveforms bilaterally not felt to have a significant arterial issue. I do not get the feeling that they felt that any part of her nonhealing wounds were related to either arterial or venous issues. They did note that she had venous reflux at the right at the [facility] and [facility]. And also on the left there were reflux in the deep system at the common femoral vein and greater saphenous vein in the proximal thigh. Nothing amenable to ablation. 2/20; she is making some decent progress on the right where there is nice skin between the 2 open areas on the right ankle. The surfaces here do not look viable yet there is some surrounding epithelialization. She still has a small area on the left medial ankle area. Hyper-granulated Jody's away always 2/28 patient has an appointment with plastic surgery on 3/8. We will see her back on 3/9. She may have to call us to get the area redressed. We've been  using Santyl under silver alginate. We made a nice improvement on the left medial ankle. The larger wounds on the right also looks somewhat better in terms of epithelialization although I think they could benefit from an aggressive debridement if plastic surgery would be willing to do that. Perhaps placement of Integra or a cell 07/26/2021: She saw Dr. Claudia Desanctis yesterday. He raised the question as to whether or not this might be pyoderma and wanted to wait until that question was answered by dermatology before proceeding with any sort of operative debridement. We have continue to use Santyl under silver alginate with Kerlix and Coban wraps. Overall, her wounds appear to be continuing to contract and epithelialize, with some granulation tissue present. There continues to be some slough on all wound surfaces. 08/09/2021: She has not been able to get an  appointment with dermatology because apparently the offices in [place] do not accept Medicaid. She is looking into whether or not she can be seen at the main [facility] dermatology clinic. This is necessary because plastic surgery is concerned that her wounds might represent pyoderma and they did not want to do any procedure until that was clarified. We have been using Santyl under silver alginate with Kerlix and Coban wraps. Today, there was a greater amount of drainage on her dressings with a slight green discoloration and significant odor. Despite this, her wounds continue to contract and epithelialize. There is pale granulation tissue present and actually, on the left medial ankle, the granulation tissue is a bit hypertrophic. 08/16/2021: Last week, I took a culture and this grew back rare methicillin-resistant Staph aureus and rare corynebacterium. The MRSA was sensitive to gentamicin which we began applying topically on an empiric basis. This week, her wounds are a bit smaller and the drainage and odor are less. Her primary care provider is working on assisting the patient with a dermatology evaluation. She has been in silver alginate over the gentamicin that was started last week along with Kerlix and Coban wraps. 08/23/2021: Because she has Medicaid, we have been unable to get her into see any dermatologist in the [place] to rule out pyoderma gangrenosum, which was a requirement from plastic surgery prior to any sort of debridement and grafting. Despite this, however, all of her wounds continue to get smaller. The wound on her left medial ankle is nearly closed. There is no odor from the wounds, although she still accumulates a modest amount of drainage on her dressings. 08/30/2021: The lateral right ankle wound and the medial left ankle wound are a bit smaller today. The medial right ankle wound is about the same size. They are less tender. We have still been unable to get  her into dermatology. 09/06/2021: All of the wounds are about the same size today. She continues to endorse minimal pain. I communicated with Dr. Claudia Desanctis in plastic surgery regarding our issues getting a dermatology appointment; he was out of town but indicated that he would look into perhaps performing the biopsy in his office and will have his office contact her. 09/14/2021: The patient has an appointment in dermatology, but it is not until October. Her wounds are roughly the same; she continues to have very thick purulent-looking drainage on her dressings. 09/20/2021: The left medial wound is nearly closed and just has a bit of accumulated eschar on the surface. The right medial and lateral ankle wounds are perhaps a little bit smaller. They continue to have a very pale surface with accumulation  of thin slough. 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 I did not think [employer] was indicated based on this. She is getting topical mupirocin with Prisma silver collagen. 10/04/2021: The patient was not seen in clinic last week due to childcare coverage issues. In the interim, the left medial leg wound has closed. The right sided leg wounds are smaller. There is more granulation tissue coming through, particularly on the lateral wound. The surface remains somewhat gritty. We have been applying topical mupirocin and Prisma silver collagen. 10/11/2021: The left medial leg wound remains closed. She does complain of some anesthetic sensation to the area. Both of the right-sided leg wounds are smaller but still have accumulated slough. 10/18/2021: Both right-sided leg wounds are minimally smaller this week. She still continues to accumulate slough and has thick drainage on her dressings. 10/23/2021: Both wounds continue to contract. There is still slough buildup. She has been approved for a keratin-based skin substitute trial product but it will not be available until next week. 10/30/2021: The  wounds are about the same to perhaps slightly smaller. There is still continued slough buildup. Unfortunately, the rep for the keratin based product did not show up today and did not answer his phone when called. 11/08/2021: The wounds are little bit smaller today. She continues to have thick drainage but the surfaces are relatively clean with just a little bit of slough accumulation. She reported to me today that she is unable to completely flex her left ankle and on examination it seems this is potentially related to scar tissue from her wounds. We do have the ProgenaMatrix trial product available for her today. 11/15/2021: Both wounds are smaller today. There is some slough accumulation on the surfaces, but the medial wound, in particular looks like it is filling in and is less deep. She did hear from physical therapy and she is going to start working with them on July 11. She is here for her second application of the trial skin substitute, ProgenaMatrix. 11/22/2021: Both wounds continue to contract, the medial more dramatically than the lateral. Both wounds have a layer of slough on the surface, but underneath this, the gritty fibrous tissue has a little bit more of a pink cast to it rather than being as pale as it has been. 11/29/2021: The wounds are roughly the same size this week, perhaps a millimeter or 2 smaller. The medial wound has filled in and is nearly flush with the surrounding skin surface. She continues to have a lot of slough accumulation on both surfaces. 12/06/2021: No significant change to her wounds, but she has a new opening on her dorsal foot, just distal to the right lateral ankle wound. The area on her left medial ankle that reopened looks a little bit larger today. She has quite a bit of pain associated with the new wound. 12/12/2021: Her wounds look about the same but the new opening on her right lateral dorsal foot is a little bit bigger. She continues to have a fair amount of  pain with this wound. 12/26/2021: The left medial ankle wound is tiny and superficial. She has 2 areas of crusting on her left lateral ankle, however, that appear to be threatening to open again. Her right medial ankle wound is a little bit smaller today but still continues to accumulate thick rubbery slough. The new dorsal foot wound is exquisitely painful but there is no odor or purulent drainage. No erythema or induration. The right lateral ankle  wound looks about the same today, again with thick rubbery slough. 01/03/2022: The left medial ankle wound has closed again. Both right ankle wounds appear to be about the same size with thick rubbery slough. The dorsal foot wound on the right continues to be quite painful and she stated that she did not want any debridement of that site today. 01/10/2022: No real change to any of her wounds. She continues to accumulate thick slough. The dorsal foot wound has merged with the lateral malleolar wound. She is experiencing significant pain in the dorsal foot portion of the ulcer. 01/16/2022: Absolutely no change or progress in her wounds. 01/24/2022: Her wounds are unchanged. She continues to build up slough and the wounds on her dorsal right foot are still exquisitely tender. 01/31/2022: The wounds actually measure a little bit narrower today. They still have thick slough on the surface but the underlying tissue seems a little less fibrotic. We changed to Iodosorb last week. 02/07/2022: Wounds continue to slowly epithelialize around the parameters. She has less pain today. She still accumulates a fairly substantial layer of slough. 02/15/2022: No change to the wounds overall. The measured a little bit larger today per the intake nurse. She continues to have substantial slough accumulation and her pain is a little bit worse. Electronic Signature(s) Signed: 02/15/2022 11:17:04 AM By: Fredirick Maudlin MD FACS Entered By: Fredirick Maudlin on 02/15/2022  11:17:04 -------------------------------------------------------------------------------- Physical Exam Details Patient Name: Date of Service: [name] 02/15/2022 10:00 A M Medical Record Number: [id number] Patient Account Number: [id number] Date of Birth/Sex: Treating RN: 12-Feb-1971 (51 y.o. F) Primary Care Provider: [name] Other Clinician: Referring Provider: Treating Provider/Extender: Elyse Jarvis Weeks in Treatment: 21 Constitutional . . . . No acute distress.Marland Kitchen Respiratory Normal work of breathing on room air.. Notes 02/15/2022: No change to the wounds overall. The measured a little bit larger today per the intake nurse. She continues to have substantial slough accumulation and her pain is a little bit worse. Electronic Signature(s) Signed: 02/15/2022 11:17:38 AM By: Fredirick Maudlin MD FACS Entered By: Fredirick Maudlin on 02/15/2022 11:17:38 -------------------------------------------------------------------------------- Physician Orders Details Patient Name: Date of Service: [name] 02/15/2022 10:00 A M Medical Record Number: [id number] Patient Account Number: [id number] Date of Birth/Sex: Treating RN: 03-31-1971 (51 y.o. [name] Primary Care Provider: [name] Other Clinician: Referring Provider: Treating Provider/Extender: Elyse Jarvis Weeks in Treatment: 80 Verbal / Phone Orders: No Diagnosis Coding ICD-10 Coding Code Description L97.818 Non-pressure chronic ulcer of other part of right lower leg with other specified severity 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 Follow-up Appointments ppointment in 1 week. - Dr. Celine Ahr Room 3 on Thursday 02/21/22 at 10:15am Return A Anesthetic (In clinic) Topical Lidocaine 4% applied to wound bed - In Clinic Bathing/ Shower/ Hygiene May shower with protection but do not get wound dressing(s) wet. - Can get cast protector bags at [facility] or  [facility] Edema Control - Lymphedema / SCD / Other Elevate legs to the level of the heart or above for 30 minutes daily and/or when sitting, a frequency of: - throughout the day Avoid standing for long periods of time. Exercise regularly Additional Orders / Instructions Follow Nutritious Diet Wound Treatment Wound #17 - Lower Leg Wound Laterality: Right, Lateral Cleanser: Soap and Water Every Other Day/30 Days Discharge Instructions: May shower and wash wound with dial antibacterial soap and water prior to dressing change. Cleanser: Wound Cleanser Every Other Day/30 Days Discharge Instructions: Cleanse the  wound with wound cleanser prior to applying a clean dressing using gauze sponges, not tissue or cotton balls. Peri-Wound Care: Triamcinolone 15 (g) Every Other Day/30 Days Discharge Instructions: Use triamcinolone 15 (g) as directed Peri-Wound Care: Sween Lotion (Moisturizing lotion) Every Other Day/30 Days Discharge Instructions: Apply moisturizing lotion as directed Prim Dressing: IODOFLEX 0.9% Cadexomer Iodine Pad 4x6 cm Every Other Day/30 Days ary Discharge Instructions: Apply to wound bed as instructed Secondary Dressing: ABD Pad, 5x9 Every Other Day/30 Days Discharge Instructions: Apply over primary dressing as directed. Secondary Dressing: Zetuvit Plus 4x8 in Every Other Day/30 Days Discharge Instructions: Apply over primary dressing as directed. Secured With: Elastic Bandage 4 inch (ACE bandage) Every Other Day/30 Days Discharge Instructions: Secure with ACE bandage as directed. Compression Wrap: Kerlix Roll 4.5x3.1 (in/yd) Every Other Day/30 Days Discharge Instructions: Apply Kerlix and Coban compression as directed. Wound #21 - Ankle Wound Laterality: Right, Medial Cleanser: Soap and Water Every Other Day/30 Days Discharge Instructions: May shower and wash wound with dial antibacterial soap and water prior to dressing change. Cleanser: Wound Cleanser Every Other Day/30  Days Discharge Instructions: Cleanse the wound with wound cleanser prior to applying a clean dressing using gauze sponges, not tissue or cotton balls. Peri-Wound Care: Triamcinolone 15 (g) Every Other Day/30 Days Discharge Instructions: Use triamcinolone 15 (g) as directed Peri-Wound Care: Sween Lotion (Moisturizing lotion) Every Other Day/30 Days Discharge Instructions: Apply moisturizing lotion as directed Prim Dressing: IODOFLEX 0.9% Cadexomer Iodine Pad 4x6 cm Every Other Day/30 Days ary Discharge Instructions: Apply to wound bed as instructed Secondary Dressing: ABD Pad, 5x9 Every Other Day/30 Days Discharge Instructions: Apply over primary dressing as directed. Secondary Dressing: Zetuvit Plus 4x8 in Every Other Day/30 Days Discharge Instructions: Apply over primary dressing as directed. Secured With: Elastic Bandage 4 inch (ACE bandage) Every Other Day/30 Days Discharge Instructions: Secure with ACE bandage as directed. Compression Wrap: Kerlix Roll 4.5x3.1 (in/yd) Every Other Day/30 Days Discharge Instructions: Apply Kerlix and Coban compression as directed. Consults Plastic Surgery - referral for surgical debridement for nonhealing sickle cell wounds to the right lower leg - biopsy in the past has showed these are not pyoderma ICD10: L97.818 D57.1 Patient Medications llergies: No Known Allergies A Notifications Medication Indication Start End 02/15/2022 lidocaine DOSE topical 4 % cream - cream topical Electronic Signature(s) Signed: 02/15/2022 12:16:21 PM By: Fredirick Maudlin MD FACS Entered By: Fredirick Maudlin on 02/15/2022 11:17:55 Prescription 02/15/2022 -------------------------------------------------------------------------------- Wess Botts MD Patient Name: Provider: Oct 30, 1970 [id number] Date of Birth: NPI#Wanda Plump [id number] Sex: DEA #: [phone] Phone #: License #: [place] Patient  Address: [address] [address] [address] , [place], [place] [address] [phone] Allergies No Known Allergies Provider's Orders Plastic Surgery - referral for surgical debridement for nonhealing sickle cell wounds to the right lower leg - biopsy in the past has showed these are not pyoderma ICD10: L97.818 D57.1 Hand Signature: Date(s): Electronic Signature(s) Signed: 02/15/2022 11:19:47 AM By: Fredirick Maudlin MD FACS Entered By: Fredirick Maudlin on 02/15/2022 11:19:47 -------------------------------------------------------------------------------- Problem List Details Patient Name: Date of Service: [name] 02/15/2022 10:00 A M Medical Record Number: [id number] Patient Account Number: [id number] Date of Birth/Sex: Treating RN: December 29, 1970 (51 y.o. F) Primary Care Provider: [name] Other Clinician: Referring Provider: Treating Provider/Extender: Elyse Jarvis Weeks in Treatment: 19 Active Problems ICD-10 Encounter Code Description Active Date MDM Diagnosis L97.818 Non-pressure chronic ulcer of other part  of right lower leg with other specified 04/17/2021 No Yes severity 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 04/17/2021 No Yes Inactive Problems ICD-10 Code Description Active Date Inactive Date L97.828 Non-pressure chronic ulcer of other part of left lower leg with other specified severity 04/17/2021 04/17/2021 Resolved Problems Electronic Signature(s) Signed: 02/15/2022 11:15:10 AM By: Fredirick Maudlin MD FACS Entered By: Fredirick Maudlin on 02/15/2022 11:15:10 -------------------------------------------------------------------------------- Progress Note Details Patient Name: Date of Service: [name] 02/15/2022 10:00 A M Medical Record Number: [id number] Patient Account Number: [id number] Date of Birth/Sex: Treating RN: Mar 29, 1971 (51 y.o. F) Primary Care Provider: [name]  Other Clinician: Referring Provider: Treating Provider/Extender: Elyse Jarvis Weeks in Treatment: 1 Subjective Chief Complaint Information obtained from Patient the patient is here for evaluation of her bilateral lower extremity sickle cell ulcers 04/17/2021; patient comes in for substantial wounds on the right and left lower leg History of Present Illness (HPI) The following HPI elements were documented for the patient's wound: Location: medial and lateral ankle region on the right and left medial malleolus Quality: Patient reports experiencing a shooting pain to affected area(s). Severity: Patient states wound(s) are getting worse. Duration: right lower extremity bimalleolar ulcers have been present for approximately 2 years; the rright meedial malleolus ulcer has been there proximally 6 months Timing: Pain in wound is constant (hurts all the time) Context: The wound would happen gradually Associated Signs and Symptoms: Patient reports having increase discharge. 51 year old patient with a history of sickle cell anemia who was last seen by me with ulceration of the right lower extremity above the ankle and was referred to Dr. Leland Johns for a surgical debridement as I was unable to do anything in the office due to excruciating pain. At that stage she was referred from the plastic surgery service to dermatology who treated her for a skin infection with doxycycline and then Levaquin and a local antibiotic ointment. I understand the patient has since developed ulceration on the left ankle both medial and lateral and was now referred back to the wound center as dermatology has finished the management. I do not have any notes from the dermatology department Old notes: 51 year old patient with a history of sickle cell anemia, pain bilateral lower extremities, right lower extremity ulcer and has a history of receiving a skin graft( Theraskin) several months ago. She has been  visiting the wound center [facility] and was seen by Dr. Dellia Nims and Dr. Leland Johns. after prolonged conservator therapy between July 2016 and January 2017. She had been seen by the plastic surgeon and taken to the OR for debridement and application of Theraskin. She had 3 applications of Theraskin and was then treated with collagen. Prior to that she had a history of similar problems in 2014 and was treated conservatively. Had a reflux study done for the right lower extremity in August 2016 without reflux or DVT . Past medical history significant for sickle cell disease, anemia, leg ulcers, cholelithiasis,and has never been a smoker. Once the patient was discharged on the wound center she says within 2 or 3 weeks the problems recurred and she has been treating it conservatively. since I saw her 3 weeks ago at [facility] she has been unable to get her dressing material but has completed a course of doxycycline. 6/7/ 2017 -- lower extremity venous duplex reflux evaluation was done oo No evidence of SVT or DVT in the RLL. No venous incompetence in the RLL. No further vascular workup is indicated at this time. She  was seen by Dr. [name], on 10/04/2015. She agreed with the plan of taking her to the OR for debridement and application of theraskin and would also take biopsies to rule out pyoderma gangrenosum. Follow-up note dated May 31 received and she was status post application of Theraskin to multiple ulcers around the right ankle. Pathology did not show evidence of malignancy or pyoderma gangrenosum. She would continue to see as in the wound clinic for further care and see Dr. Leland Johns as needed. The patient brought the biopsy report and it was consistent with stasis ulcer no evidence of malignancy and the comment was that there was some adjacent neovascularization, fibrosis and patchy perivascular chronic inflammation. 11/15/2015 -- today we applied her first application of  Theraskin 11/30/15; TheraSkin #2 12/13/2015 -- she is having a lot of pain locally and is here for possible application of a theraskin today. 01/16/2016 -- the patient has significant pain and has noticed despite in spite of all local care and oral pain medication. It is impossible to debride her in the office. 02/06/2016 -- I do not see any notes from Dr. Iran Planas( the patient has not made a call to the office know as she heard from them) and the only visit to recently was with her PCP Dr. Danella Penton -- I saw her on 01/16/2016 and prescribed 90 tablets of oxycodone 10 mg and did lab work and screening for HIV. the HIV was negative and hemoglobin was 6.3 with a WBC count of 14.9 and hematocrit of 17.8 with platelets of 561. 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 The bimalleolar ulcers to the right lower extremity have been present for approximately 2 years, the left medial malleolus ulcer has been present approximately 6 months. She has followed with Dr.Thimmappa in the past and has had a total of 3 applications of Theraskin (01/2015, 09/2015, 06/17/16). She has also followed with Dr. Con Memos here in the clinic and has received 2 applications of TheraSkin (11/10/15, 11/30/15). The patient does experience chronic, and is not amenable to debridement. 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 She is not currently on any antibiotic therapy and has not been treated with any recently. 07/17/2016 -- was seen by Dr. Iran Planas of plastic surgery who saw her 2 weeks postop application of Theraskin #3. She had removed her dressing and asked her to apply silver alginate on alternate days and follow-up back with the wound center. Future debridements and application of skin substitute would have to be done in the hospital due to her high risk for  anesthesia. READMISSION 04/17/2021 Patient is now a 52 year old woman that we have had in this clinic for a prolonged period of time and 2016-2017 and then again for 2 visits in February 2018. At that point she had wounds on the right lower leg predominantly medial. She had also been seen by plastic surgery Dr. Leland Johns who I believe took her to the OR for operative debridement and application of TheraSkin in 2017. After she left our clinic she was followed for a very prolonged period of time in the wound care center in [facility] who then referred her ultimately to [facility] where she was seen by Dr. Vernona Rieger. Again taken her to the OR for skin grafting which apparently did not take. She had multiple other attempts at dressings although I have not really looked  over all of these notes in great detail. She has not been seen in a wound care center in about a year. She states over the last year in addition to her right lower leg she has developed wounds on the left lower leg quite extensive. She is using Xeroform to all of these wounds without really any improvement. She also has Medicaid which does not cover wound products. The patient has had vascular work-ups in the past including most recently on 03/28/2021 showing biphasic waveforms on the right triphasic at the PTA and biphasic at the dorsalis pedis on the left. She was unable to tolerate any degree of compression to do ABIs. Unfortunately TBI's were also not done. She had venous reflux studies done in 2017. This did not show any evidence of a DVT or SVT and no venous incompetence was noted in the right leg at the time this was the only side with the wound As noted I did not look all over her old records. She apparently had a course of HBO and [facility] although I am not sure what the indication would have been. In any case she developed seizures and terminated treatment earlier. She is generally much more disabled than when we last saw her in clinic.  She can no longer walk pretty much wheelchair-bound because predominantly of pain in the left hip. 04/24/2021; the patient tolerated the wraps we put on. We used Santyl and Hydrofera Blue under compression. I brought her back for a nurse visit for a change in dressing. With Medicaid we will have a hard time getting anything paid for and hence the need for compression. She arrives in clinic with all the wounds looking somewhat better in terms of surface 12/20; circumferential wound on the right from the lateral to the medial. She has open areas on the left medial and left lateral x2 on all of this with the same surface. This does not look completely healthy although she does have some epithelialization. She is not complaining of a lot of pain which is unusual for her sickle ulcers. I have not looked over her extensive records from [facility]. She had recent arterial studies and has a history of venous reflux studies I will need to look these over although I do not believe she has significant arterial disease 2023 05/22/2021; patient's wound areas measure slightly smaller. Still a lot of drainage coming from the right we have been using Hydrofera Blue and Santyl with some improvement in the wound surfaces. She tells me she will be getting transfused later in the week for her underlying sickle cell anemia I have looked over her recent arterial studies which were done in the fall. This was in November and showed biphasic and triphasic waveforms but she could not tolerate ABIs because of pressure and unfortunately TBI's were not done. She has not had recent venous reflux studies that I can see 1/10; not much change about the same surface area. This has a yellowish surface to it very gritty. We have been using Santyl and Hydrofera Blue for a prolonged period. Culture I did last week showed methicillin sensitive staph aureus "rare". Our intake nurse reports greenish drainage which may be the Hydrofera Blue  itself 1/17; wounds are continue to measure smaller although I am not sure about the accuracy here. Especially the areas on the right are covered in what looks to be a nonviable surface although she does have some epithelialization. Similarly she has areas on the left medial and left lateral  ankle area which appear to have a better surface and perhaps are slightly smaller. We have been using Santyl and Hydrofera Blue. She cannot tolerate mechanical debridements She went for her reflux studies which showed significant reflux at the greater saphenous vein at the saphenofemoral junction as well as the greater saphenous vein in the proximal calf on the left she had reflux in the thigh and the common femoral vein and supra vein Fishel vein reflux in the greater saphenous vein. I will have vein and vascular look at this. My thoughts have been that these are likely sickle wounds. I looked through her old records from [facility] wound care center and then when she graduated to [facility] wound care center where she saw Dr. Zigmund Daniel and Dr. Vernona Rieger. Although I can see she had reflux studies done I do not see that she actually saw a vein and vascular. I went over the fact that she had operative debridements and actual skin grafting that did not take. I do not think these wounds have ever really progressed towards healing 1/31;Substantial wounds on the right ankle area. Hyper granulated very gritty adherent debris on the surface. 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 On the left she has no DVT or SVT deep vein reflux in the common femoral vein.  Superficial vein reflux in the greater saphenous vein on although the vein diameter is not really all that large. I do not think there is anything that can be done with these although I am going to send her for consultation to vein and vascular. 2/7; Wound exam; substantial wound area on the right posterior ankle area and areas on the left medial ankle and left lateral ankle. I was able to debride the left medial ankle last week fairly aggressively and it is back this week to a completely nonviable surface She will see vascular surgery this Friday and I would like them to review the venous studies and also any comments on her arterial status. If they do not see an issue here I am going to refer her to plastic surgery for an operative debridement perhaps intraoperative ACell or Integra. Eventually she will require a deep tissue culture again 2/14; substantial wound area on the right posterior ankle, medial ankle. We have been using silver alginate The patient was seen by vein and vascular she had both venous reflux studies and arterial studies. 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 She had no evidence of superficial venous thrombosis. She did have superficial vein reflux at the saphenofemoral junction and the greater saphenous vein. On the left no evidence of a DVT no evidence of superficial venous throat thrombosis she did have deep vein reflux in the common femoral vein and superficial vein reflux in the greater saphenous vein but these were not felt to be amenable to ablation. In terms of arterial studies she had triphasic and wife biphasic wave waveforms bilaterally not felt to have a significant arterial issue. I do not get the feeling that they felt that any part  of her nonhealing wounds were related to either arterial or venous issues. They did note that she had venous reflux at the right at the [facility] and [facility]. And also  on the left there were reflux in the deep system at the common femoral vein and greater saphenous vein in the proximal thigh. Nothing amenable to ablation. 2/20; she is making some decent progress on the right where there is nice skin between the 2 open areas on the right ankle. The surfaces here do not look viable yet there is some surrounding epithelialization. She still has a small area on the left medial ankle area. Hyper-granulated Jody's away always 2/28 patient has an appointment with plastic surgery on 3/8. We will see her back on 3/9. She may have to call us to get the area redressed. We've been using Santyl under silver alginate. We made a nice improvement on the left medial ankle. The larger wounds on the right also looks somewhat better in terms of epithelialization although I think they could benefit from an aggressive debridement if plastic surgery would be willing to do that. Perhaps placement of Integra or a cell 07/26/2021: She saw Dr. Claudia Desanctis yesterday. He raised the question as to whether or not this might be pyoderma and wanted to wait until that question was answered by dermatology before proceeding with any sort of operative debridement. We have continue to use Santyl under silver alginate with Kerlix and Coban wraps. Overall, her wounds appear to be continuing to contract and epithelialize, with some granulation tissue present. There continues to be some slough on all wound surfaces. 08/09/2021: She has not been able to get an appointment with dermatology because apparently the offices in [place] do not accept Medicaid. She is looking into whether or not she can be seen at the main [facility] dermatology clinic. This is necessary because plastic surgery is concerned that her wounds might represent pyoderma and they did not want to do any procedure until that was clarified. We have been using Santyl under silver alginate with Kerlix and Coban wraps. Today,  there was a greater amount of drainage on her dressings with a slight green discoloration and significant odor. Despite this, her wounds continue to contract and epithelialize. There is pale granulation tissue present and actually, on the left medial ankle, the granulation tissue is a bit hypertrophic. 08/16/2021: Last week, I took a culture and this grew back rare methicillin-resistant Staph aureus and rare corynebacterium. The MRSA was sensitive to gentamicin which we began applying topically on an empiric basis. This week, her wounds are a bit smaller and the drainage and odor are less. Her primary care provider is working on assisting the patient with a dermatology evaluation. She has been in silver alginate over the gentamicin that was started last week along with Kerlix and Coban wraps. 08/23/2021: Because she has Medicaid, we have been unable to get her into see any dermatologist in the [place] to rule out pyoderma gangrenosum, which was a requirement from plastic surgery prior to any sort of debridement and grafting. Despite this, however, all of her wounds continue to get smaller. The wound on her left medial ankle is nearly closed. There is no odor from the wounds, although she still accumulates a modest amount of drainage on her dressings. 08/30/2021: The lateral right ankle wound and the medial left ankle wound are a bit smaller today. The medial right ankle wound is about the same size. They are less tender. We  have still been unable to get her into dermatology. 09/06/2021: All of the wounds are about the same size today. She continues to endorse minimal pain. I communicated with Dr. Claudia Desanctis in plastic surgery regarding our issues getting a dermatology appointment; he was out of town but indicated that he would look into perhaps performing the biopsy in his office and will have his office contact her. 09/14/2021: The patient has an appointment in dermatology, but it is not until October. Her wounds  are roughly the same; she continues to have very thick purulent-looking drainage on her dressings. 09/20/2021: The left medial wound is nearly closed and just has a bit of accumulated eschar on the surface. The right medial and lateral ankle wounds are perhaps a little bit smaller. They continue to have a very pale surface with accumulation of thin slough. 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 I did not think [employer] was indicated based on this. She is getting topical mupirocin with Prisma silver collagen. 10/04/2021: The patient was not seen in clinic last week due to childcare coverage issues. In the interim, the left medial leg wound has closed. The right sided leg wounds are smaller. There is more granulation tissue coming through, particularly on the lateral wound. The surface remains somewhat gritty. We have been applying topical mupirocin and Prisma silver collagen. 10/11/2021: The left medial leg wound remains closed. She does complain of some anesthetic sensation to the area. Both of the right-sided leg wounds are smaller but still have accumulated slough. 10/18/2021: Both right-sided leg wounds are minimally smaller this week. She still continues to accumulate slough and has thick drainage on her dressings. 10/23/2021: Both wounds continue to contract. There is still slough buildup. She has been approved for a keratin-based skin substitute trial product but it will not be available until next week. 10/30/2021: The wounds are about the same to perhaps slightly smaller. There is still continued slough buildup. Unfortunately, the rep for the keratin based product did not show up today and did not answer his phone when called. 11/08/2021: The wounds are little bit smaller today. She continues to have thick drainage but the surfaces are relatively clean with just a little bit of slough accumulation. She reported to me today that she is unable to completely flex her left  ankle and on examination it seems this is potentially related to scar tissue from her wounds. We do have the ProgenaMatrix trial product available for her today. 11/15/2021: Both wounds are smaller today. There is some slough accumulation on the surfaces, but the medial wound, in particular looks like it is filling in and is less deep. She did hear from physical therapy and she is going to start working with them on July 11. She is here for her second application of the trial skin substitute, ProgenaMatrix. 11/22/2021: Both wounds continue to contract, the medial more dramatically than the lateral. Both wounds have a layer of slough on the surface, but underneath this, the gritty fibrous tissue has a little bit more of a pink cast to it rather than being as pale as it has been. 11/29/2021: The wounds are roughly the same size this week, perhaps a millimeter or 2 smaller. The medial wound has filled in and is nearly flush with the surrounding skin surface. She continues to have a lot of slough accumulation on both surfaces. 12/06/2021: No significant change to her wounds, but she has a new opening on her dorsal foot, just distal  to the right lateral ankle wound. The area on her left medial ankle that reopened looks a little bit larger today. She has quite a bit of pain associated with the new wound. 12/12/2021: Her wounds look about the same but the new opening on her right lateral dorsal foot is a little bit bigger. She continues to have a fair amount of pain with this wound. 12/26/2021: The left medial ankle wound is tiny and superficial. She has 2 areas of crusting on her left lateral ankle, however, that appear to be threatening to open again. Her right medial ankle wound is a little bit smaller today but still continues to accumulate thick rubbery slough. The new dorsal foot wound is exquisitely painful but there is no odor or purulent drainage. No erythema or induration. The right lateral ankle wound  looks about the same today, again with thick rubbery slough. 01/03/2022: The left medial ankle wound has closed again. Both right ankle wounds appear to be about the same size with thick rubbery slough. The dorsal foot wound on the right continues to be quite painful and she stated that she did not want any debridement of that site today. 01/10/2022: No real change to any of her wounds. She continues to accumulate thick slough. The dorsal foot wound has merged with the lateral malleolar wound. She is experiencing significant pain in the dorsal foot portion of the ulcer. 01/16/2022: Absolutely no change or progress in her wounds. 01/24/2022: Her wounds are unchanged. She continues to build up slough and the wounds on her dorsal right foot are still exquisitely tender. 01/31/2022: The wounds actually measure a little bit narrower today. They still have thick slough on the surface but the underlying tissue seems a little less fibrotic. We changed to Iodosorb last week. 02/07/2022: Wounds continue to slowly epithelialize around the parameters. She has less pain today. She still accumulates a fairly substantial layer of slough. 02/15/2022: No change to the wounds overall. The measured a little bit larger today per the intake nurse. She continues to have substantial slough accumulation and her pain is a little bit worse. Patient History Information obtained from Patient. Family History Diabetes - Mother, Lung Disease - Mother, No family history of Cancer, Heart Disease, Hereditary Spherocytosis, Hypertension, Kidney Disease, Seizures, Stroke, Thyroid Problems, Tuberculosis. Social History Never smoker, Marital Status - Married, Alcohol Use - Never, Drug Use - No History, Caffeine Use - Daily. Medical History Eyes Denies history of Cataracts, Glaucoma, Optic Neuritis Ear/Nose/Mouth/Throat Denies history of Chronic sinus problems/congestion, Middle ear problems Hematologic/Lymphatic Patient has history of  Anemia, Sickle Cell Disease Denies history of Hemophilia, Human Immunodeficiency Virus, Lymphedema Respiratory Denies history of Aspiration, Asthma, Chronic Obstructive Pulmonary Disease (COPD), Pneumothorax, Sleep Apnea, Tuberculosis Cardiovascular Denies history of Angina, Arrhythmia, Congestive Heart Failure, Coronary Artery Disease, Deep Vein Thrombosis, Hypertension, Hypotension, Myocardial Infarction, Peripheral Arterial Disease, Peripheral Venous Disease, Phlebitis, Vasculitis Gastrointestinal Denies history of Cirrhosis , Colitis, Crohnoos, Hepatitis A, Hepatitis B, Hepatitis C Endocrine Denies history of Type I Diabetes, Type II Diabetes Genitourinary Denies history of End Stage Renal Disease Immunological Denies history of Lupus Erythematosus, Raynaudoos, Scleroderma Integumentary (Skin) Denies history of History of Burn Musculoskeletal Denies history of Gout, Rheumatoid Arthritis, Osteoarthritis, Osteomyelitis Neurologic Patient has history of Neuropathy - right foot intermittant Denies history of Dementia, Quadriplegia, Paraplegia, Seizure Disorder Oncologic Denies history of Received Chemotherapy, Received Radiation Psychiatric Denies history of Anorexia/bulimia, Confinement Anxiety Hospitalization/Surgery History - c section x2. - left breast lumpectomy. - iandD right ankle with  theraskin. Medical A Surgical History Notes nd Constitutional Symptoms (General Health) H/O miscarriage Cardiovascular bradycardia Gastrointestinal cholilithiasis Objective Constitutional No acute distress.. Vitals Time Taken: 10:29 AM, Height: 67 in, Temperature: 98.4 F, Pulse: 88 bpm, Respiratory Rate: 16 breaths/min, Blood Pressure: 136/55 mmHg. Respiratory Normal work of breathing on room air.. General Notes: 02/15/2022: No change to the wounds overall. The measured a little bit larger today per the intake nurse. She continues to have substantial slough accumulation and her pain  is a little bit worse. Integumentary (Hair, Skin) Wound #17 status is Open. Original cause of wound was Gradually Appeared. The date acquired was: 10/05/2012. The wound has been in treatment 43 weeks. The wound is located on the Right,Lateral Lower Leg. The wound measures 6.4cm length x 7.4cm width x 0.1cm depth; 37.196cm^2 area and 3.72cm^3 volume. There is Fat Layer (Subcutaneous Tissue) exposed. There is no tunneling noted. There is a large amount of serosanguineous drainage noted. The wound margin is distinct with the outline attached to the wound base. There is small (1-33%) pink, pale granulation within the wound bed. There is a large (67-100%) amount of necrotic tissue within the wound bed including Eschar and Adherent Slough. Wound #21 status is Open. Original cause of wound was Gradually Appeared. The date acquired was: 06/26/2021. The wound has been in treatment 33 weeks. The wound is located on the Right,Medial Ankle. The wound measures 7.5cm length x 3cm width x 0.1cm depth; 17.671cm^2 area and 1.767cm^3 volume. There is Fat Layer (Subcutaneous Tissue) exposed. There is no tunneling or undermining noted. There is a medium amount of serosanguineous drainage noted. The wound margin is distinct with the outline attached to the wound base. There is small (1-33%) pink granulation within the wound bed. There is a large (67-100%) amount of necrotic tissue within the wound bed including Eschar and Adherent Slough. 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 There was a Selective/Open Wound Non-Viable Tissue Debridement with a total area of 14 sq cm performed by Fredirick Maudlin, MD. With the following instrument(s): Curette to remove Non-Viable  tissue/material. Material removed includes [facility] after achieving pain control using Lidocaine 4% Topical Solution. No specimens were taken. A time out was conducted at 10:47, prior to the start of the procedure. A Minimum amount of bleeding was controlled with Pressure. The procedure was tolerated well with a pain level of 5 throughout and a pain level of 0 following the procedure. Post Debridement Measurements: 6.4cm length x 7.4cm width x 0.1cm depth; 3.72cm^3 volume. Character of Wound/Ulcer Post Debridement is improved. Post procedure Diagnosis Wound #17: Same as Pre-Procedure General Notes: scribed for Dr. Celine Ahr by Adline Peals, RN. Wound #21 Pre-procedure diagnosis of Wound #21 is a Sickle Cell Lesion located on the Right,Medial Ankle .Severity of Tissue Pre Debridement is: Fat layer exposed. There was a Selective/Open Wound Non-Viable Tissue Debridement with a total area of 12 sq cm performed by Fredirick Maudlin, MD. With the following instrument(s): Curette to remove Non-Viable tissue/material. Material removed includes [facility] after achieving pain control using Lidocaine 4% Topical Solution. No specimens were taken. A time out was conducted at 10:47, prior to the start of the procedure. A Minimum amount of bleeding was controlled with Pressure. The procedure was tolerated well with a pain level of 5 throughout and a pain  level of 0 following the procedure. Post Debridement Measurements: 7.5cm length x 3cm width x 0.1cm depth; 1.767cm^3 volume. Character of Wound/Ulcer Post Debridement is improved. Severity of Tissue Post Debridement is: Fat layer exposed. Post procedure Diagnosis Wound #21: Same as Pre-Procedure General Notes: scribed for Dr. Celine Ahr by Adline Peals, RN. Plan Follow-up Appointments: Return Appointment in 1 week. - Dr. Celine Ahr Room 3 on Thursday 02/21/22 at 10:15am Anesthetic: (In clinic) Topical Lidocaine 4% applied to wound bed - In Clinic Bathing/ Shower/  Hygiene: May shower with protection but do not get wound dressing(s) wet. - Can get cast protector bags at [facility] or [facility] Edema Control - Lymphedema / SCD / Other: Elevate legs to the level of the heart or above for 30 minutes daily and/or when sitting, a frequency of: - throughout the day Avoid standing for long periods of time. Exercise regularly Additional Orders / Instructions: Follow Nutritious Diet Consults ordered were: Plastic Surgery - referral for surgical debridement for nonhealing sickle cell wounds to the right lower leg - biopsy in the past has showed these are not pyoderma ICD10: L97.818 D57.1 The following medication(s) was prescribed: lidocaine topical 4 % cream cream topical was prescribed at facility WOUND #17: - Lower Leg Wound Laterality: Right, Lateral Cleanser: Soap and Water Every Other Day/30 Days Discharge Instructions: May shower and wash wound with dial antibacterial soap and water prior to dressing change. Cleanser: Wound Cleanser Every Other Day/30 Days Discharge Instructions: Cleanse the wound with wound cleanser prior to applying a clean dressing using gauze sponges, not tissue or cotton balls. Peri-Wound Care: Triamcinolone 15 (g) Every Other Day/30 Days Discharge Instructions: Use triamcinolone 15 (g) as directed Peri-Wound Care: Sween Lotion (Moisturizing lotion) Every Other Day/30 Days Discharge Instructions: Apply moisturizing lotion as directed Prim Dressing: IODOFLEX 0.9% Cadexomer Iodine Pad 4x6 cm Every Other Day/30 Days ary Discharge Instructions: Apply to wound bed as instructed Secondary Dressing: ABD Pad, 5x9 Every Other Day/30 Days Discharge Instructions: Apply over primary dressing as directed. Secondary Dressing: Zetuvit Plus 4x8 in Every Other Day/30 Days Discharge Instructions: Apply over primary dressing as directed. Secured With: Elastic Bandage 4 inch (ACE bandage) Every Other Day/30 Days Discharge Instructions: Secure with ACE  bandage as directed. Com pression Wrap: Kerlix Roll 4.5x3.1 (in/yd) Every Other Day/30 Days Discharge Instructions: Apply Kerlix and Coban compression as directed. WOUND #21: - Ankle Wound Laterality: Right, Medial Cleanser: Soap and Water Every Other Day/30 Days Discharge Instructions: May shower and wash wound with dial antibacterial soap and water prior to dressing change. Cleanser: Wound Cleanser Every Other Day/30 Days Discharge Instructions: Cleanse the wound with wound cleanser prior to applying a clean dressing using gauze sponges, not tissue or cotton balls. Peri-Wound Care: Triamcinolone 15 (g) Every Other Day/30 Days Discharge Instructions: Use triamcinolone 15 (g) as directed Peri-Wound Care: Sween Lotion (Moisturizing lotion) Every Other Day/30 Days Discharge Instructions: Apply moisturizing lotion as directed Prim Dressing: IODOFLEX 0.9% Cadexomer Iodine Pad 4x6 cm Every Other Day/30 Days ary Discharge Instructions: Apply to wound bed as instructed Secondary Dressing: ABD Pad, 5x9 Every Other Day/30 Days Discharge Instructions: Apply over primary dressing as directed. Secondary Dressing: Zetuvit Plus 4x8 in Every Other Day/30 Days Discharge Instructions: Apply over primary dressing as directed. Secured With: Elastic Bandage 4 inch (ACE bandage) Every Other Day/30 Days Discharge Instructions: Secure with ACE bandage as directed. Com pression Wrap: Kerlix Roll 4.5x3.1 (in/yd) Every Other Day/30 Days Discharge Instructions: Apply Kerlix and Coban compression as directed. 02/15/2022: No change to the  wounds overall. The measured a little bit larger today per the intake nurse. She continues to have substantial slough accumulation and her pain is a little bit worse. I debrided slough from both of the wounds, but this was extremely painful for the patient. I do not think she is going to improve at all unless she can get a good surgical debridement. She was previously referred to  plastic surgery, but Dr. Claudia Desanctis did not want to proceed with operative intervention until a biopsy was done to confirm that this was not pyoderma gangrenosum; the patient has already had a biopsy in the past showing that this is not the case. We will refer her again to plastics, as Dr. Claudia Desanctis is no longer with the group. For now we will continue using Iodoflex with Kerlix and Ace bandaging. Follow-up in 1 week. Electronic Signature(s) Signed: 02/15/2022 11:19:25 AM By: Fredirick Maudlin MD FACS Entered By: Fredirick Maudlin on 02/15/2022 11:19:25 -------------------------------------------------------------------------------- HxROS Details Patient Name: Date of Service: [name] 02/15/2022 10:00 A M Medical Record Number: [id number] Patient Account Number: [id number] Date of Birth/Sex: Treating RN: Jun 19, 1970 (51 y.o. F) Primary Care Provider: [name] Other Clinician: Referring Provider: Treating Provider/Extender: Elyse Jarvis Weeks in Treatment: 78 Information Obtained From Patient Constitutional Symptoms (General Health) Medical History: Past Medical History Notes: H/O miscarriage Eyes Medical History: Negative for: Cataracts; Glaucoma; Optic Neuritis Ear/Nose/Mouth/Throat Medical History: Negative for: Chronic sinus problems/congestion; Middle ear problems Hematologic/Lymphatic Medical History: Positive for: Anemia; Sickle Cell Disease Negative for: Hemophilia; Human Immunodeficiency Virus; Lymphedema Respiratory Medical History: Negative for: Aspiration; Asthma; Chronic Obstructive Pulmonary Disease (COPD); Pneumothorax; Sleep Apnea; Tuberculosis Cardiovascular Medical History: Negative for: Angina; Arrhythmia; Congestive Heart Failure; Coronary Artery Disease; Deep Vein Thrombosis; Hypertension; Hypotension; Myocardial Infarction; Peripheral Arterial Disease; Peripheral Venous Disease; Phlebitis; Vasculitis Past Medical History  Notes: bradycardia Gastrointestinal Medical History: Negative for: Cirrhosis ; Colitis; Crohns; Hepatitis A; Hepatitis B; Hepatitis C Past Medical History Notes: cholilithiasis Endocrine Medical History: Negative for: Type I Diabetes; Type II Diabetes Genitourinary Medical History: Negative for: End Stage Renal Disease Immunological Medical History: Negative for: Lupus Erythematosus; Raynauds; Scleroderma Integumentary (Skin) Medical History: Negative for: History of Burn Musculoskeletal Medical History: Negative for: Gout; Rheumatoid Arthritis; Osteoarthritis; Osteomyelitis Neurologic Medical History: Positive for: Neuropathy - right foot intermittant Negative for: Dementia; Quadriplegia; Paraplegia; Seizure Disorder Oncologic Medical History: Negative for: Received Chemotherapy; Received Radiation Psychiatric Medical History: Negative for: Anorexia/bulimia; Confinement Anxiety Immunizations Pneumococcal Vaccine: Received Pneumococcal Vaccination: No Implantable Devices None Hospitalization / Surgery History Type of Hospitalization/Surgery c section x2 left breast lumpectomy iandD right ankle with theraskin Family and Social History Cancer: No; Diabetes: Yes - Mother; Heart Disease: No; Hereditary Spherocytosis: No; Hypertension: No; Kidney Disease: No; Lung Disease: Yes - Mother; Seizures: No; Stroke: No; Thyroid Problems: No; Tuberculosis: No; Never smoker; Marital Status - Married; Alcohol Use: Never; Drug Use: No History; Caffeine Use: Daily; Financial Concerns: No; Food, Clothing or Shelter Needs: No; Support System Lacking: No; Transportation Concerns: No Engineer, maintenance) Signed: 02/15/2022 12:16:21 PM By: Fredirick Maudlin MD FACS Entered By: Fredirick Maudlin on 02/15/2022 11:17:10 -------------------------------------------------------------------------------- SuperBill Details Patient Name: Date of Service: [place], [place] [name] 02/15/2022 Medical  Record Number: [id number] Patient Account Number: [id number] Date of Birth/Sex: Treating RN: December 02, 1970 (51 y.o. 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 Facility Procedures CPT4 Code: [id number] Description: 7597 - DEBRIDE WOUND 1ST 20 SQ CM OR < ICD-10 Diagnosis Description L97.818 Non-pressure chronic ulcer of other part of right lower leg with other specified s Modifier: everity Quantity: 1 CPT4 Code: [id number] Description: 7598 - DEBRIDE WOUND EA ADDL 20 SQ CM ICD-10 Diagnosis Description L97.818 Non-pressure chronic ulcer of other part of right lower leg with other specified s Modifier: everity Quantity: 1 Physician Procedures : CPT4 Code Description Modifier [id number] - WC PHYS LEVEL 4 - EST PT 25 ICD-10 Diagnosis Description 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

## 2022-02-21 ENCOUNTER — Encounter (HOSPITAL_BASED_OUTPATIENT_CLINIC_OR_DEPARTMENT_OTHER): Payer: Medicaid Other | Attending: General Surgery | Admitting: General Surgery

## 2022-02-21 DIAGNOSIS — I10 Essential (primary) hypertension: Secondary | ICD-10-CM | POA: Insufficient documentation

## 2022-02-21 DIAGNOSIS — I82531 Chronic embolism and thrombosis of right popliteal vein: Secondary | ICD-10-CM | POA: Diagnosis not present

## 2022-02-21 DIAGNOSIS — D571 Sickle-cell disease without crisis: Secondary | ICD-10-CM | POA: Insufficient documentation

## 2022-02-21 DIAGNOSIS — L97818 Non-pressure chronic ulcer of other part of right lower leg with other specified severity: Secondary | ICD-10-CM | POA: Diagnosis not present

## 2022-02-21 NOTE — Progress Notes (Signed)
[name], [name] ([id number]) Visit Report for 02/21/2022 Chief Complaint Document Details Patient Name: Date of Service: [name] [name] 02/21/2022 10:15 A M Medical Record Number: [id number] Patient Account Number: [id number] Date of Birth/Sex: Treating RN: 1971/02/09 (51 y.o. F) Primary Care Provider: [name] Other Clinician: Referring Provider: Treating Provider/Extender: Elyse Jarvis Weeks in Treatment: 47 Information Obtained from: Patient Chief Complaint the patient is here for evaluation of her bilateral lower extremity sickle cell ulcers 04/17/2021; patient comes in for substantial wounds on the right and left lower leg Electronic Signature(s) Signed: 02/21/2022 11:40:32 AM By: Fredirick Maudlin MD FACS Entered By: Fredirick Maudlin on 02/21/2022 11:40:32 -------------------------------------------------------------------------------- Debridement Details Patient Name: Date of Service: [name], [name] 02/21/2022 10:15 A M Medical Record Number: [id number] Patient Account Number: [id number] Date of Birth/Sex: Treating RN: 04-May-1971 (51 y.o. [name] Primary Care Provider: [name] Other Clinician: Referring Provider: Treating Provider/Extender: Elyse Jarvis Weeks in Treatment: 44 Debridement Performed for Assessment: Wound #17 Right,Lateral Lower Leg Performed By: Physician Fredirick Maudlin, MD Debridement Type: Debridement Level of Consciousness (Pre-procedure): Awake and Alert Pre-procedure Verification/Time Out Yes - 11:16 Taken: Start Time: 11:16 Pain Control: Lidocaine 5% topical ointment T Area Debrided (L x W): otal 7 (cm) x 6.2 (cm) = 43.4 (cm) Tissue and other material debrided: Non-Viable, Slough, Slough Level: Non-Viable Tissue Debridement Description: Selective/Open Wound Instrument: Curette Bleeding: Minimum Hemostasis Achieved: Pressure Procedural Pain: 3 Post Procedural Pain:  3 Response to Treatment: Procedure was tolerated well Level of Consciousness (Post- Awake and Alert procedure): Post Debridement Measurements of Total Wound Length: (cm) 7 Width: (cm) 6.2 Depth: (cm) 0.1 Volume: (cm) 3.409 Character of Wound/Ulcer Post Debridement: Improved Post Procedure Diagnosis Same as Pre-procedure Electronic Signature(s) Signed: 02/21/2022 12:28:30 PM By: Fredirick Maudlin MD FACS Signed: 02/21/2022 6:18:14 PM By: Baruch Gouty RN, BSN Entered By: Baruch Gouty on 02/21/2022 11:17:45 -------------------------------------------------------------------------------- Debridement Details Patient Name: Date of Service: [name], [name] 02/21/2022 10:15 A M Medical Record Number: [id number] Patient Account Number: [id number] Date of Birth/Sex: Treating RN: 11/17/1970 (51 y.o. [name] Primary Care Provider: [name] Other Clinician: Referring Provider: Treating Provider/Extender: Elyse Jarvis Weeks in Treatment: 44 Debridement Performed for Assessment: Wound #21 Right,Medial Ankle Performed By: Physician Fredirick Maudlin, MD Debridement Type: Debridement Severity of Tissue Pre Debridement: Fat layer exposed Level of Consciousness (Pre-procedure): Awake and Alert Pre-procedure Verification/Time Out Yes - 11:16 Taken: Start Time: 11:16 Pain Control: Lidocaine 5% topical ointment T Area Debrided (L x W): otal 7.5 (cm) x 4.3 (cm) = 32.25 (cm) Tissue and other material debrided: Non-Viable, Slough, Slough Level: Non-Viable Tissue Debridement Description: Selective/Open Wound Instrument: Curette Bleeding: Minimum Hemostasis Achieved: Pressure Procedural Pain: 3 Post Procedural Pain: 3 Response to Treatment: Procedure was tolerated well Level of Consciousness (Post- Awake and Alert procedure): Post Debridement Measurements of Total Wound Length: (cm) 7.5 Width: (cm) 4.3 Depth: (cm) 0.1 Volume: (cm)  2.533 Character of Wound/Ulcer Post Debridement: Improved Severity of Tissue Post Debridement: Fat layer exposed Post Procedure Diagnosis Same as Pre-procedure Electronic Signature(s) Signed: 02/21/2022 12:28:30 PM By: Fredirick Maudlin MD FACS Signed: 02/21/2022 6:18:14 PM By: Baruch Gouty RN, BSN Entered By: Baruch Gouty on 02/21/2022 11:18:33 -------------------------------------------------------------------------------- HPI Details Patient Name: Date of Service: [name], [name] 02/21/2022 10:15 A M Medical Record Number: [id number] Patient Account Number: [id number] Date of Birth/Sex: Treating RN: Nov 22, 1970 (50 y.o. F) Primary Care Provider: Other Clinician: [name] Referring Provider: Treating Provider/Extender: [name]  [name], [name] Weeks in Treatment: 44 History of Present Illness Location: medial and lateral ankle region on the right and left medial malleolus Quality: Patient reports experiencing a shooting pain to affected area(s). Severity: Patient states wound(s) are getting worse. Duration: right lower extremity bimalleolar ulcers have been present for approximately 2 years; the rright meedial malleolus ulcer has been there proximally 6 months Timing: Pain in wound is constant (hurts all the time) Context: The wound would happen gradually ssociated Signs and Symptoms: Patient reports having increase discharge. A HPI Description: 51 year old patient with a history of sickle cell anemia who was last seen by me with ulceration of the right lower extremity above the ankle and was referred to Dr. Leland Johns for a surgical debridement as I was unable to do anything in the office due to excruciating pain. At that stage she was referred from the plastic surgery service to dermatology who treated her for a skin infection with doxycycline and then Levaquin and a local antibiotic ointment. I understand the patient has since developed ulceration on the left  ankle both medial and lateral and was now referred back to the wound center as dermatology has finished the management. I do not have any notes from the dermatology department Old notes: 51 year old patient with a history of sickle cell anemia, pain bilateral lower extremities, right lower extremity ulcer and has a history of receiving a skin graft( Theraskin) several months ago. She has been visiting the wound center [facility] and was seen by Dr. Dellia Nims and Dr. Leland Johns. after prolonged conservator therapy between July 2016 and January 2017. She had been seen by the plastic surgeon and taken to the OR for debridement and application of Theraskin. She had 3 applications of Theraskin and was then treated with collagen. Prior to that she had a history of similar problems in 2014 and was treated conservatively. Had a reflux study done for the right lower extremity in August 2016 without reflux or DVT . Past medical history significant for sickle cell disease, anemia, leg ulcers, cholelithiasis,and has never been a smoker. Once the patient was discharged on the wound center she says within 2 or 3 weeks the problems recurred and she has been treating it conservatively. since I saw her 3 weeks ago at [facility] she has been unable to get her dressing material but has completed a course of doxycycline. 6/7/ 2017 -- lower extremity venous duplex reflux evaluation was done No evidence of SVT or DVT in the RLL. No venous incompetence in the RLL. No further vascular workup is indicated at this time. She was seen by Dr. [name], on 10/04/2015. She agreed with the plan of taking her to the OR for debridement and application of theraskin and would also take biopsies to rule out pyoderma gangrenosum. Follow-up note dated May 31 received and she was status post application of Theraskin to multiple ulcers around the right ankle. Pathology did not show evidence of malignancy or pyoderma gangrenosum. She  would continue to see as in the wound clinic for further care and see Dr. Leland Johns as needed. The patient brought the biopsy report and it was consistent with stasis ulcer no evidence of malignancy and the comment was that there was some adjacent neovascularization, fibrosis and patchy perivascular chronic inflammation. 11/15/2015 -- today we applied her first application of Theraskin 11/30/15; TheraSkin #2 12/13/2015 -- she is having a lot of pain locally and is here for possible application of a theraskin today. 01/16/2016 -- the patient has  significant pain and has noticed despite in spite of all local care and oral pain medication. It is impossible to debride her in the office. 02/06/2016 -- I do not see any notes from Dr. Iran Planas( the patient has not made a call to the office know as she heard from them) and the only visit to recently was with her PCP Dr. Danella Penton -- I saw her on 01/16/2016 and prescribed 90 tablets of oxycodone 10 mg and did lab work and screening for HIV. the HIV was negative and hemoglobin was 6.3 with a WBC count of 14.9 and hematocrit of 17.8 with platelets of 561. 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 The bimalleolar ulcers to the right lower extremity have been present for approximately 2 years, the left medial malleolus ulcer has been present approximately 6 months. She has followed with Dr.Thimmappa in the past and has had a total of 3 applications of Theraskin (01/2015, 09/2015, 06/17/16). She has also followed with Dr. Con Memos here in the clinic and has received 2 applications of TheraSkin (11/10/15, 11/30/15). The patient does experience chronic, and is not amenable to debridement. 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 She is not currently on any antibiotic therapy and has not been treated with any  recently. 07/17/2016 -- was seen by Dr. Iran Planas of plastic surgery who saw her 2 weeks postop application of Theraskin #3. She had removed her dressing and asked her to apply silver alginate on alternate days and follow-up back with the wound center. Future debridements and application of skin substitute would have to be done in the hospital due to her high risk for anesthesia. READMISSION 04/17/2021 Patient is now a 51 year old woman that we have had in this clinic for a prolonged period of time and 2016-2017 and then again for 2 visits in February 2018. At that point she had wounds on the right lower leg predominantly medial. She had also been seen by plastic surgery Dr. Leland Johns who I believe took her to the OR for operative debridement and application of TheraSkin in 2017. After she left our clinic she was followed for a very prolonged period of time in the wound care center in [facility] who then referred her ultimately to [facility] where she was seen by Dr. Vernona Rieger. Again taken her to the OR for skin grafting which apparently did not take. She had multiple other attempts at dressings although I have not really looked over all of these notes in great detail. She has not been seen in a wound care center in about a year. She states over the last year in addition to her right lower leg she has developed wounds on the left lower leg quite extensive. She is using Xeroform to all of these wounds without really any improvement. She also has Medicaid which does not cover wound products. The patient has had vascular work-ups in the past including most recently on 03/28/2021 showing biphasic waveforms on the right triphasic at the PTA and biphasic at the dorsalis pedis on the left. She was unable to tolerate any degree of compression to do ABIs. Unfortunately TBI's were also not done. She had venous reflux studies done in 2017. This did not show any evidence of a DVT or SVT and no venous incompetence was  noted in the right leg at the time this was the only  side with the wound As noted I did not look all over her old records. She apparently had a course of HBO and [facility] although I am not sure what the indication would have been. In any case she developed seizures and terminated treatment earlier. She is generally much more disabled than when we last saw her in clinic. She can no longer walk pretty much wheelchair-bound because predominantly of pain in the left hip. 04/24/2021; the patient tolerated the wraps we put on. We used Santyl and Hydrofera Blue under compression. I brought her back for a nurse visit for a change in dressing. With Medicaid we will have a hard time getting anything paid for and hence the need for compression. She arrives in clinic with all the wounds looking somewhat better in terms of surface 12/20; circumferential wound on the right from the lateral to the medial. She has open areas on the left medial and left lateral x2 on all of this with the same surface. This does not look completely healthy although she does have some epithelialization. She is not complaining of a lot of pain which is unusual for her sickle ulcers. I have not looked over her extensive records from [facility]. She had recent arterial studies and has a history of venous reflux studies I will need to look these over although I do not believe she has significant arterial disease 2023 05/22/2021; patient's wound areas measure slightly smaller. Still a lot of drainage coming from the right we have been using Hydrofera Blue and Santyl with some improvement in the wound surfaces. She tells me she will be getting transfused later in the week for her underlying sickle cell anemia I have looked over her recent arterial studies which were done in the fall. This was in November and showed biphasic and triphasic waveforms but she could not tolerate ABIs because of pressure and unfortunately TBI's were not done. She has  not had recent venous reflux studies that I can see 1/10; not much change about the same surface area. This has a yellowish surface to it very gritty. We have been using Santyl and Hydrofera Blue for a prolonged period. Culture I did last week showed methicillin sensitive staph aureus "rare". Our intake nurse reports greenish drainage which may be the Hydrofera Blue itself 1/17; wounds are continue to measure smaller although I am not sure about the accuracy here. Especially the areas on the right are covered in what looks to be a nonviable surface although she does have some epithelialization. Similarly she has areas on the left medial and left lateral ankle area which appear to have a better surface and perhaps are slightly smaller. We have been using Santyl and Hydrofera Blue. She cannot tolerate mechanical debridements She went for her reflux studies which showed significant reflux at the greater saphenous vein at the saphenofemoral junction as well as the greater saphenous vein in the proximal calf on the left she had reflux in the thigh and the common femoral vein and supra vein Fishel vein reflux in the greater saphenous vein. I will have vein and vascular look at this. My thoughts have been that these are likely sickle wounds. I looked through her old records from [facility] wound care center and then when she graduated to [facility] wound care center where she saw Dr. Zigmund Daniel and Dr. Vernona Rieger. Although I can see she had reflux studies done I do not see that she actually saw a vein and vascular. I went over  the fact that she had operative debridements and actual skin grafting that did not take. I do not think these wounds have ever really progressed towards healing 1/31;Substantial wounds on the right ankle area. Hyper granulated very gritty adherent debris on the surface. 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 On the left she has no DVT or SVT deep vein reflux in the common femoral vein. Superficial vein reflux in the greater saphenous vein on although the vein diameter is not really all that large. I do not think there is anything that can be done with these although I am going to send her for consultation to vein and vascular. 2/7; Wound exam; substantial wound area on the right posterior ankle area and areas on the left medial ankle and left lateral ankle. I was able to debride the left medial ankle last week fairly aggressively and it is back this week to a completely nonviable surface She will see vascular surgery this Friday and I would like them to review the venous studies and also any comments on her arterial status. If they do not see an issue here I am going to refer her to plastic surgery for an operative debridement perhaps intraoperative ACell or Integra. Eventually she will require a deep tissue culture again 2/14; substantial wound area on the right posterior ankle, medial ankle. We have been using silver alginate The patient was seen by vein and vascular she had both venous reflux studies and arterial studies. 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 She had no evidence of superficial venous thrombosis. She did have superficial vein reflux at the saphenofemoral junction and the greater saphenous vein. On the left no evidence of a DVT no evidence of superficial venous throat thrombosis she did have deep vein reflux in the common femoral vein and superficial vein reflux in the greater saphenous vein but these were not felt to be  amenable to ablation. In terms of arterial studies she had triphasic and wife biphasic wave waveforms bilaterally not felt to have a significant arterial issue. I do not get the feeling that they felt that any part of her nonhealing wounds were related to either arterial or venous issues. They did note that she had venous reflux at the right at the [facility] and [facility]. And also on the left there were reflux in the deep system at the common femoral vein and greater saphenous vein in the proximal thigh. Nothing amenable to ablation. 2/20; she is making some decent progress on the right where there is nice skin between the 2 open areas on the right ankle. The surfaces here do not look viable yet there is some surrounding epithelialization. She still has a small area on the left medial ankle area. Hyper-granulated Jody's away always 2/28 patient has an appointment with plastic surgery on 3/8. We will see her back on 3/9. She may have to call us to get the area redressed. We've been using Santyl under silver alginate. We made a nice improvement on the left medial ankle. The  larger wounds on the right also looks somewhat better in terms of epithelialization although I think they could benefit from an aggressive debridement if plastic surgery would be willing to do that. Perhaps placement of Integra or a cell 07/26/2021: She saw Dr. Claudia Desanctis yesterday. He raised the question as to whether or not this might be pyoderma and wanted to wait until that question was answered by dermatology before proceeding with any sort of operative debridement. We have continue to use Santyl under silver alginate with Kerlix and Coban wraps. Overall, her wounds appear to be continuing to contract and epithelialize, with some granulation tissue present. There continues to be some slough on all wound surfaces. 08/09/2021: She has not been able to get an appointment with dermatology because apparently the offices in [place] do  not accept Medicaid. She is looking into whether or not she can be seen at the main [facility] dermatology clinic. This is necessary because plastic surgery is concerned that her wounds might represent pyoderma and they did not want to do any procedure until that was clarified. We have been using Santyl under silver alginate with Kerlix and Coban wraps. Today, there was a greater amount of drainage on her dressings with a slight green discoloration and significant odor. Despite this, her wounds continue to contract and epithelialize. There is pale granulation tissue present and actually, on the left medial ankle, the granulation tissue is a bit hypertrophic. 08/16/2021: Last week, I took a culture and this grew back rare methicillin-resistant Staph aureus and rare corynebacterium. The MRSA was sensitive to gentamicin which we began applying topically on an empiric basis. This week, her wounds are a bit smaller and the drainage and odor are less. Her primary care provider is working on assisting the patient with a dermatology evaluation. She has been in silver alginate over the gentamicin that was started last week along with Kerlix and Coban wraps. 08/23/2021: Because she has Medicaid, we have been unable to get her into see any dermatologist in the [place] to rule out pyoderma gangrenosum, which was a requirement from plastic surgery prior to any sort of debridement and grafting. Despite this, however, all of her wounds continue to get smaller. The wound on her left medial ankle is nearly closed. There is no odor from the wounds, although she still accumulates a modest amount of drainage on her dressings. 08/30/2021: The lateral right ankle wound and the medial left ankle wound are a bit smaller today. The medial right ankle wound is about the same size. They are less tender. We have still been unable to get her into dermatology. 09/06/2021: All of the wounds are about the same size today. She  continues to endorse minimal pain. I communicated with Dr. Claudia Desanctis in plastic surgery regarding our issues getting a dermatology appointment; he was out of town but indicated that he would look into perhaps performing the biopsy in his office and will have his office contact her. 09/14/2021: The patient has an appointment in dermatology, but it is not until October. Her wounds are roughly the same; she continues to have very thick purulent-looking drainage on her dressings. 09/20/2021: The left medial wound is nearly closed and just has a bit of accumulated eschar on the surface. The right medial and lateral ankle wounds are perhaps a little bit smaller. They continue to have a very pale surface with accumulation of thin slough. 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 I  did not think [employer] was indicated based on this. She is getting topical mupirocin with Prisma silver collagen. 10/04/2021: The patient was not seen in clinic last week due to childcare coverage issues. In the interim, the left medial leg wound has closed. The right sided leg wounds are smaller. There is more granulation tissue coming through, particularly on the lateral wound. The surface remains somewhat gritty. We have been applying topical mupirocin and Prisma silver collagen. 10/11/2021: The left medial leg wound remains closed. She does complain of some anesthetic sensation to the area. Both of the right-sided leg wounds are smaller but still have accumulated slough. 10/18/2021: Both right-sided leg wounds are minimally smaller this week. She still continues to accumulate slough and has thick drainage on her dressings. 10/23/2021: Both wounds continue to contract. There is still slough buildup. She has been approved for a keratin-based skin substitute trial product but it will not be available until next week. 10/30/2021: The wounds are about the same to perhaps slightly smaller. There is still continued slough  buildup. Unfortunately, the rep for the keratin based product did not show up today and did not answer his phone when called. 11/08/2021: The wounds are little bit smaller today. She continues to have thick drainage but the surfaces are relatively clean with just a little bit of slough accumulation. She reported to me today that she is unable to completely flex her left ankle and on examination it seems this is potentially related to scar tissue from her wounds. We do have the ProgenaMatrix trial product available for her today. 11/15/2021: Both wounds are smaller today. There is some slough accumulation on the surfaces, but the medial wound, in particular looks like it is filling in and is less deep. She did hear from physical therapy and she is going to start working with them on July 11. She is here for her second application of the trial skin substitute, ProgenaMatrix. 11/22/2021: Both wounds continue to contract, the medial more dramatically than the lateral. Both wounds have a layer of slough on the surface, but underneath this, the gritty fibrous tissue has a little bit more of a pink cast to it rather than being as pale as it has been. 11/29/2021: The wounds are roughly the same size this week, perhaps a millimeter or 2 smaller. The medial wound has filled in and is nearly flush with the surrounding skin surface. She continues to have a lot of slough accumulation on both surfaces. 12/06/2021: No significant change to her wounds, but she has a new opening on her dorsal foot, just distal to the right lateral ankle wound. The area on her left medial ankle that reopened looks a little bit larger today. She has quite a bit of pain associated with the new wound. 12/12/2021: Her wounds look about the same but the new opening on her right lateral dorsal foot is a little bit bigger. She continues to have a fair amount of pain with this wound. 12/26/2021: The left medial ankle wound is tiny and superficial. She  has 2 areas of crusting on her left lateral ankle, however, that appear to be threatening to open again. Her right medial ankle wound is a little bit smaller today but still continues to accumulate thick rubbery slough. The new dorsal foot wound is exquisitely painful but there is no odor or purulent drainage. No erythema or induration. The right lateral ankle wound looks about the same today, again with thick rubbery slough. 01/03/2022: The left medial ankle  wound has closed again. Both right ankle wounds appear to be about the same size with thick rubbery slough. The dorsal foot wound on the right continues to be quite painful and she stated that she did not want any debridement of that site today. 01/10/2022: No real change to any of her wounds. She continues to accumulate thick slough. The dorsal foot wound has merged with the lateral malleolar wound. She is experiencing significant pain in the dorsal foot portion of the ulcer. 01/16/2022: Absolutely no change or progress in her wounds. 01/24/2022: Her wounds are unchanged. She continues to build up slough and the wounds on her dorsal right foot are still exquisitely tender. 01/31/2022: The wounds actually measure a little bit narrower today. They still have thick slough on the surface but the underlying tissue seems a little less fibrotic. We changed to Iodosorb last week. 02/07/2022: Wounds continue to slowly epithelialize around the parameters. She has less pain today. She still accumulates a fairly substantial layer of slough. 02/15/2022: No change to the wounds overall. The measured a little bit larger today per the intake nurse. She continues to have substantial slough accumulation and her pain is a little bit worse. 02/21/2022. No change at all to her wounds. I do not see that plastic surgery has received her referral yet. Electronic Signature(s) Signed: 02/21/2022 11:44:45 AM By: Fredirick Maudlin MD FACS Entered By: Fredirick Maudlin on 02/21/2022  11:44:44 -------------------------------------------------------------------------------- Physical Exam Details Patient Name: Date of Service: [name], [name] 02/21/2022 10:15 A M Medical Record Number: [id number] Patient Account Number: [id number] Date of Birth/Sex: Treating RN: Mar 09, 1971 (51 y.o. F) Primary Care Provider: [name] Other Clinician: Referring Provider: Treating Provider/Extender: Elyse Jarvis Weeks in Treatment: 44 Constitutional . . . . No acute distress.Marland Kitchen Respiratory Normal work of breathing on room air.. Notes 02/21/2022: There has been absolutely no change to her wounds whatsoever. They remain fibrotic and painful. Electronic Signature(s) Signed: 02/21/2022 11:47:09 AM By: Fredirick Maudlin MD FACS Entered By: Fredirick Maudlin on 02/21/2022 11:47:09 -------------------------------------------------------------------------------- Physician Orders Details Patient Name: Date of Service: [name], [name] 02/21/2022 10:15 A M Medical Record Number: [id number] Patient Account Number: [id number] Date of Birth/Sex: Treating RN: 16-Feb-1971 (51 y.o. [name] Primary Care Provider: [name] Other Clinician: Referring Provider: Treating Provider/Extender: Elyse Jarvis Weeks in Treatment: 78 Verbal / Phone Orders: No Diagnosis Coding ICD-10 Coding Code Description L97.818 Non-pressure chronic ulcer of other part of right lower leg with other specified severity 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 Follow-up Appointments ppointment in 1 week. - Dr. Celine [name] Room 3 Return A Anesthetic (In clinic) Topical Lidocaine 4% applied to wound bed - In Clinic Bathing/ Shower/ Hygiene May shower with protection but do not get wound dressing(s) wet. - Can get cast protector bags at [facility] or [facility] Edema Control - Lymphedema / SCD / Other Elevate legs to the level of the heart or above for 30 minutes daily and/or  when sitting, a frequency of: - throughout the day Avoid standing for long periods of time. Exercise regularly Additional Orders / Instructions Follow Nutritious Diet Wound Treatment Wound #17 - Lower Leg Wound Laterality: Right, Lateral Cleanser: Soap and Water Every Other Day/30 Days Discharge Instructions: May shower and wash wound with dial antibacterial soap and water prior to dressing change. Cleanser: Wound Cleanser Every Other Day/30 Days Discharge Instructions: Cleanse the wound with wound cleanser prior to applying a clean dressing using gauze sponges, not tissue or cotton balls.  Peri-Wound Care: Triamcinolone 15 (g) Every Other Day/30 Days Discharge Instructions: Use triamcinolone 15 (g) as directed Peri-Wound Care: Sween Lotion (Moisturizing lotion) Every Other Day/30 Days Discharge Instructions: Apply moisturizing lotion as directed Prim Dressing: IODOFLEX 0.9% Cadexomer Iodine Pad 4x6 cm Every Other Day/30 Days ary Discharge Instructions: Apply to wound bed as instructed Secondary Dressing: ABD Pad, 5x9 Every Other Day/30 Days Discharge Instructions: Apply over primary dressing as directed. Secondary Dressing: Zetuvit Plus 4x8 in Every Other Day/30 Days Discharge Instructions: Apply over primary dressing as directed. Secured With: Elastic Bandage 4 inch (ACE bandage) Every Other Day/30 Days Discharge Instructions: Secure with ACE bandage as directed. Compression Wrap: Kerlix Roll 4.5x3.1 (in/yd) Every Other Day/30 Days Discharge Instructions: Apply Kerlix and Coban compression as directed. Wound #21 - Ankle Wound Laterality: Right, Medial Cleanser: Soap and Water Every Other Day/30 Days Discharge Instructions: May shower and wash wound with dial antibacterial soap and water prior to dressing change. Cleanser: Wound Cleanser Every Other Day/30 Days Discharge Instructions: Cleanse the wound with wound cleanser prior to applying a clean dressing using gauze sponges, not  tissue or cotton balls. Peri-Wound Care: Triamcinolone 15 (g) Every Other Day/30 Days Discharge Instructions: Use triamcinolone 15 (g) as directed Peri-Wound Care: Sween Lotion (Moisturizing lotion) Every Other Day/30 Days Discharge Instructions: Apply moisturizing lotion as directed Prim Dressing: IODOFLEX 0.9% Cadexomer Iodine Pad 4x6 cm Every Other Day/30 Days ary Discharge Instructions: Apply to wound bed as instructed Secondary Dressing: ABD Pad, 5x9 Every Other Day/30 Days Discharge Instructions: Apply over primary dressing as directed. Secondary Dressing: Zetuvit Plus 4x8 in Every Other Day/30 Days Discharge Instructions: Apply over primary dressing as directed. Secured With: Elastic Bandage 4 inch (ACE bandage) Every Other Day/30 Days Discharge Instructions: Secure with ACE bandage as directed. Compression Wrap: Kerlix Roll 4.5x3.1 (in/yd) Every Other Day/30 Days Discharge Instructions: Apply Kerlix and Coban compression as directed. Electronic Signature(s) Signed: 02/21/2022 12:28:30 PM By: Fredirick Maudlin MD FACS Entered By: Fredirick Maudlin on 02/21/2022 11:49:37 -------------------------------------------------------------------------------- Problem List Details Patient Name: Date of Service: [name], [name] 02/21/2022 10:15 A M Medical Record Number: [id number] Patient Account Number: [id number] Date of Birth/Sex: Treating RN: Jul 30, 1970 (51 y.o. F) Primary Care Provider: [name] Other Clinician: Referring Provider: Treating Provider/Extender: Elyse Jarvis Weeks in Treatment: 65 Active Problems ICD-10 Encounter Code Description Active Date MDM Diagnosis L97.818 Non-pressure chronic ulcer of other part of right lower leg with other specified 04/17/2021 No Yes severity 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 04/17/2021 No Yes Inactive Problems ICD-10 Code Description Active Date Inactive Date L97.828 Non-pressure chronic ulcer of  other part of left lower leg with other specified severity 04/17/2021 04/17/2021 Resolved Problems Electronic Signature(s) Signed: 02/21/2022 11:40:20 AM By: Fredirick Maudlin MD FACS Entered By: Fredirick Maudlin on 02/21/2022 11:40:20 -------------------------------------------------------------------------------- Progress Note Details Patient Name: Date of Service: [name], [name] 02/21/2022 10:15 A M Medical Record Number: [id number] Patient Account Number: [id number] Date of Birth/Sex: Treating RN: [birth year] (52 y.o. F) Primary Care Provider: [name] Other Clinician: Referring Provider: Treating Provider/Extender: Elyse Jarvis Weeks in Treatment: 60 Subjective Chief Complaint Information obtained from Patient the patient is here for evaluation of her bilateral lower extremity sickle cell ulcers 04/17/2021; patient comes in for substantial wounds on the right and left lower leg History of Present Illness (HPI) The following HPI elements were documented for the patient's wound: Location: medial and lateral ankle region on the right and left medial malleolus Quality: Patient reports experiencing a  shooting pain to affected area(s). Severity: Patient states wound(s) are getting worse. Duration: right lower extremity bimalleolar ulcers have been present for approximately 2 years; the rright meedial malleolus ulcer has been there proximally 6 months Timing: Pain in wound is constant (hurts all the time) Context: The wound would happen gradually Associated Signs and Symptoms: Patient reports having increase discharge. 51 year old patient with a history of sickle cell anemia who was last seen by me with ulceration of the right lower extremity above the ankle and was referred to Dr. Leland Johns for a surgical debridement as I was unable to do anything in the office due to excruciating pain. At that stage she was referred from the plastic surgery service to  dermatology who treated her for a skin infection with doxycycline and then Levaquin and a local antibiotic ointment. I understand the patient has since developed ulceration on the left ankle both medial and lateral and was now referred back to the wound center as dermatology has finished the management. I do not have any notes from the dermatology department Old notes: 51 year old patient with a history of sickle cell anemia, pain bilateral lower extremities, right lower extremity ulcer and has a history of receiving a skin graft( Theraskin) several months ago. She has been visiting the wound center [facility] and was seen by Dr. Dellia Nims and Dr. Leland Johns. after prolonged conservator therapy between July 2016 and January 2017. She had been seen by the plastic surgeon and taken to the OR for debridement and application of Theraskin. She had 3 applications of Theraskin and was then treated with collagen. Prior to that she had a history of similar problems in 2014 and was treated conservatively. Had a reflux study done for the right lower extremity in August 2016 without reflux or DVT . Past medical history significant for sickle cell disease, anemia, leg ulcers, cholelithiasis,and has never been a smoker. Once the patient was discharged on the wound center she says within 2 or 3 weeks the problems recurred and she has been treating it conservatively. since I saw her 3 weeks ago at [facility] she has been unable to get her dressing material but has completed a course of doxycycline. 6/7/ 2017 -- lower extremity venous duplex reflux evaluation was done oo No evidence of SVT or DVT in the RLL. No venous incompetence in the RLL. No further vascular workup is indicated at this time. She was seen by Dr. [name], on 10/04/2015. She agreed with the plan of taking her to the OR for debridement and application of theraskin and would also take biopsies to rule out pyoderma gangrenosum. Follow-up note  dated May 31 received and she was status post application of Theraskin to multiple ulcers around the right ankle. Pathology did not show evidence of malignancy or pyoderma gangrenosum. She would continue to see as in the wound clinic for further care and see Dr. Leland Johns as needed. The patient brought the biopsy report and it was consistent with stasis ulcer no evidence of malignancy and the comment was that there was some adjacent neovascularization, fibrosis and patchy perivascular chronic inflammation. 11/15/2015 -- today we applied her first application of Theraskin 11/30/15; TheraSkin #2 12/13/2015 -- she is having a lot of pain locally and is here for possible application of a theraskin today. 01/16/2016 -- the patient has significant pain and has noticed despite in spite of all local care and oral pain medication. It is impossible to debride her in the office. 02/06/2016 -- I do not see  any notes from Dr. Iran Planas( the patient has not made a call to the office know as she heard from them) and the only visit to recently was with her PCP Dr. Danella Penton -- I saw her on 01/16/2016 and prescribed 90 tablets of oxycodone 10 mg and did lab work and screening for HIV. the HIV was negative and hemoglobin was 6.3 with a WBC count of 14.9 and hematocrit of 17.8 with platelets of 561. 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 The bimalleolar ulcers to the right lower extremity have been present for approximately 2 years, the left medial malleolus ulcer has been present approximately 6 months. She has followed with Dr.Thimmappa in the past and has had a total of 3 applications of Theraskin (01/2015, 09/2015, 06/17/16). She has also followed with Dr. Con Memos here in the clinic and has received 2 applications of TheraSkin (11/10/15, 11/30/15). The patient does experience chronic, and is not  amenable to debridement. 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 She is not currently on any antibiotic therapy and has not been treated with any recently. 07/17/2016 -- was seen by Dr. Iran Planas of plastic surgery who saw her 2 weeks postop application of Theraskin #3. She had removed her dressing and asked her to apply silver alginate on alternate days and follow-up back with the wound center. Future debridements and application of skin substitute would have to be done in the hospital due to her high risk for anesthesia. READMISSION 04/17/2021 Patient is now a 51 year old woman that we have had in this clinic for a prolonged period of time and 2016-2017 and then again for 2 visits in February 2018. At that point she had wounds on the right lower leg predominantly medial. She had also been seen by plastic surgery Dr. Leland Johns who I believe took her to the OR for operative debridement and application of TheraSkin in 2017. After she left our clinic she was followed for a very prolonged period of time in the wound care center in [facility] who then referred her ultimately to [facility] where she was seen by Dr. Vernona Rieger. Again taken her to the OR for skin grafting which apparently did not take. She had multiple other attempts at dressings although I have not really looked over all of these notes in great detail. She has not been seen in a wound care center in about a year. She states over the last year in addition to her right lower leg she has developed wounds on the left lower leg quite extensive. She is using Xeroform to all of these wounds without really any improvement. She also has Medicaid which does not cover wound products. The patient has had vascular work-ups in the past including most recently on 03/28/2021 showing biphasic waveforms on the right triphasic at the PTA and biphasic at the dorsalis pedis on the left. She was unable to tolerate any degree of  compression to do ABIs. Unfortunately TBI's were also not done. She had venous reflux studies done in 2017. This did not show any evidence of a DVT or SVT and no venous incompetence was noted in the right leg at the time this was the only side with the wound As noted I did not look all over her old records. She apparently had a course of HBO and [facility] although I am not sure what the  indication would have been. In any case she developed seizures and terminated treatment earlier. She is generally much more disabled than when we last saw her in clinic. She can no longer walk pretty much wheelchair-bound because predominantly of pain in the left hip. 04/24/2021; the patient tolerated the wraps we put on. We used Santyl and Hydrofera Blue under compression. I brought her back for a nurse visit for a change in dressing. With Medicaid we will have a hard time getting anything paid for and hence the need for compression. She arrives in clinic with all the wounds looking somewhat better in terms of surface 12/20; circumferential wound on the right from the lateral to the medial. She has open areas on the left medial and left lateral x2 on all of this with the same surface. This does not look completely healthy although she does have some epithelialization. She is not complaining of a lot of pain which is unusual for her sickle ulcers. I have not looked over her extensive records from [facility]. She had recent arterial studies and has a history of venous reflux studies I will need to look these over although I do not believe she has significant arterial disease 2023 05/22/2021; patient's wound areas measure slightly smaller. Still a lot of drainage coming from the right we have been using Hydrofera Blue and Santyl with some improvement in the wound surfaces. She tells me she will be getting transfused later in the week for her underlying sickle cell anemia I have looked over her recent arterial studies which  were done in the fall. This was in November and showed biphasic and triphasic waveforms but she could not tolerate ABIs because of pressure and unfortunately TBI's were not done. She has not had recent venous reflux studies that I can see 1/10; not much change about the same surface area. This has a yellowish surface to it very gritty. We have been using Santyl and Hydrofera Blue for a prolonged period. Culture I did last week showed methicillin sensitive staph aureus "rare". Our intake nurse reports greenish drainage which may be the Hydrofera Blue itself 1/17; wounds are continue to measure smaller although I am not sure about the accuracy here. Especially the areas on the right are covered in what looks to be a nonviable surface although she does have some epithelialization. Similarly she has areas on the left medial and left lateral ankle area which appear to have a better surface and perhaps are slightly smaller. We have been using Santyl and Hydrofera Blue. She cannot tolerate mechanical debridements She went for her reflux studies which showed significant reflux at the greater saphenous vein at the saphenofemoral junction as well as the greater saphenous vein in the proximal calf on the left she had reflux in the thigh and the common femoral vein and supra vein Fishel vein reflux in the greater saphenous vein. I will have vein and vascular look at this. My thoughts have been that these are likely sickle wounds. I looked through her old records from [facility] wound care center and then when she graduated to [facility] wound care center where she saw Dr. Zigmund Daniel and Dr. Vernona Rieger. Although I can see she had reflux studies done I do not see that she actually saw a vein and vascular. I went over the fact that she had operative debridements and actual skin grafting that did not take. I do not think these wounds have ever really progressed towards healing 1/31;Substantial wounds on the  right ankle  area. Hyper granulated very gritty adherent debris on the surface. 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 On the left she has no DVT or SVT deep vein reflux in the common femoral vein. Superficial vein reflux in the greater saphenous vein on although the vein diameter is not really all that large. I do not think there is anything that can be done with these although I am going to send her for consultation to vein and vascular. 2/7; Wound exam; substantial wound area on the right posterior ankle area and areas on the left medial ankle and left lateral ankle. I was able to debride the left medial ankle last week fairly aggressively and it is back this week to a completely nonviable surface She will see vascular surgery this Friday and I would like them to review the venous studies and also any comments on her arterial status. If they do not see an issue here I am going to refer her to plastic surgery for an operative debridement perhaps intraoperative ACell or Integra. Eventually she will require a deep tissue culture again 2/14; substantial wound area on the right posterior ankle, medial ankle. We have been using silver alginate The patient was seen by vein and vascular she had both venous reflux studies and arterial studies. 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 She had no evidence of superficial venous thrombosis. She did have superficial vein reflux at the saphenofemoral junction and the greater saphenous vein. On the left no evidence of a DVT no evidence of  superficial venous throat thrombosis she did have deep vein reflux in the common femoral vein and superficial vein reflux in the greater saphenous vein but these were not felt to be amenable to ablation. In terms of arterial studies she had triphasic and wife biphasic wave waveforms bilaterally not felt to have a significant arterial issue. I do not get the feeling that they felt that any part of her nonhealing wounds were related to either arterial or venous issues. They did note that she had venous reflux at the right at the [facility] and [facility]. And also on the left there were reflux in the deep system at the common femoral vein and greater saphenous vein in the proximal thigh. Nothing amenable to ablation. 2/20; she is making some decent progress on the right where there is nice skin between the 2 open areas on the right ankle. The surfaces here do not look viable yet there is some surrounding epithelialization. She still has a small area on the left medial ankle area. Hyper-granulated Jody's away always 2/28 patient has an appointment with plastic surgery on 3/8. We will see her back on 3/9. She may have to call us to get the area redressed. We've been using Santyl under silver alginate. We made a nice improvement on the left medial ankle. The larger wounds on the right also looks somewhat better in terms of epithelialization although I think they could benefit from an aggressive debridement if plastic surgery would be willing to do  that. Perhaps placement of Integra or a cell 07/26/2021: She saw Dr. Claudia Desanctis yesterday. He raised the question as to whether or not this might be pyoderma and wanted to wait until that question was answered by dermatology before proceeding with any sort of operative debridement. We have continue to use Santyl under silver alginate with Kerlix and Coban wraps. Overall, her wounds appear to be continuing to contract and epithelialize, with some granulation tissue present. There  continues to be some slough on all wound surfaces. 08/09/2021: She has not been able to get an appointment with dermatology because apparently the offices in [place] do not accept Medicaid. She is looking into whether or not she can be seen at the main [facility] dermatology clinic. This is necessary because plastic surgery is concerned that her wounds might represent pyoderma and they did not want to do any procedure until that was clarified. We have been using Santyl under silver alginate with Kerlix and Coban wraps. Today, there was a greater amount of drainage on her dressings with a slight green discoloration and significant odor. Despite this, her wounds continue to contract and epithelialize. There is pale granulation tissue present and actually, on the left medial ankle, the granulation tissue is a bit hypertrophic. 08/16/2021: Last week, I took a culture and this grew back rare methicillin-resistant Staph aureus and rare corynebacterium. The MRSA was sensitive to gentamicin which we began applying topically on an empiric basis. This week, her wounds are a bit smaller and the drainage and odor are less. Her primary care provider is working on assisting the patient with a dermatology evaluation. She has been in silver alginate over the gentamicin that was started last week along with Kerlix and Coban wraps. 08/23/2021: Because she has Medicaid, we have been unable to get her into see any dermatologist in the [place] to rule out pyoderma gangrenosum, which was a requirement from plastic surgery prior to any sort of debridement and grafting. Despite this, however, all of her wounds continue to get smaller. The wound on her left medial ankle is nearly closed. There is no odor from the wounds, although she still accumulates a modest amount of drainage on her dressings. 08/30/2021: The lateral right ankle wound and the medial left ankle wound are a bit smaller today. The medial  right ankle wound is about the same size. They are less tender. We have still been unable to get her into dermatology. 09/06/2021: All of the wounds are about the same size today. She continues to endorse minimal pain. I communicated with Dr. Claudia Desanctis in plastic surgery regarding our issues getting a dermatology appointment; he was out of town but indicated that he would look into perhaps performing the biopsy in his office and will have his office contact her. 09/14/2021: The patient has an appointment in dermatology, but it is not until October. Her wounds are roughly the same; she continues to have very thick purulent-looking drainage on her dressings. 09/20/2021: The left medial wound is nearly closed and just has a bit of accumulated eschar on the surface. The right medial and lateral ankle wounds are perhaps a little bit smaller. They continue to have a very pale surface with accumulation of thin slough. 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 I did not think [employer] was indicated based on this. She is getting topical mupirocin with Prisma silver collagen. 10/04/2021: The patient was not seen in clinic last week due to childcare  coverage issues. In the interim, the left medial leg wound has closed. The right sided leg wounds are smaller. There is more granulation tissue coming through, particularly on the lateral wound. The surface remains somewhat gritty. We have been applying topical mupirocin and Prisma silver collagen. 10/11/2021: The left medial leg wound remains closed. She does complain of some anesthetic sensation to the area. Both of the right-sided leg wounds are smaller but still have accumulated slough. 10/18/2021: Both right-sided leg wounds are minimally smaller this week. She still continues to accumulate slough and has thick drainage on her dressings. 10/23/2021: Both wounds continue to contract. There is still slough buildup. She has been approved for a  keratin-based skin substitute trial product but it will not be available until next week. 10/30/2021: The wounds are about the same to perhaps slightly smaller. There is still continued slough buildup. Unfortunately, the rep for the keratin based product did not show up today and did not answer his phone when called. 11/08/2021: The wounds are little bit smaller today. She continues to have thick drainage but the surfaces are relatively clean with just a little bit of slough accumulation. She reported to me today that she is unable to completely flex her left ankle and on examination it seems this is potentially related to scar tissue from her wounds. We do have the ProgenaMatrix trial product available for her today. 11/15/2021: Both wounds are smaller today. There is some slough accumulation on the surfaces, but the medial wound, in particular looks like it is filling in and is less deep. She did hear from physical therapy and she is going to start working with them on July 11. She is here for her second application of the trial skin substitute, ProgenaMatrix. 11/22/2021: Both wounds continue to contract, the medial more dramatically than the lateral. Both wounds have a layer of slough on the surface, but underneath this, the gritty fibrous tissue has a little bit more of a pink cast to it rather than being as pale as it has been. 11/29/2021: The wounds are roughly the same size this week, perhaps a millimeter or 2 smaller. The medial wound has filled in and is nearly flush with the surrounding skin surface. She continues to have a lot of slough accumulation on both surfaces. 12/06/2021: No significant change to her wounds, but she has a new opening on her dorsal foot, just distal to the right lateral ankle wound. The area on her left medial ankle that reopened looks a little bit larger today. She has quite a bit of pain associated with the new wound. 12/12/2021: Her wounds look about the same but the new  opening on her right lateral dorsal foot is a little bit bigger. She continues to have a fair amount of pain with this wound. 12/26/2021: The left medial ankle wound is tiny and superficial. She has 2 areas of crusting on her left lateral ankle, however, that appear to be threatening to open again. Her right medial ankle wound is a little bit smaller today but still continues to accumulate thick rubbery slough. The new dorsal foot wound is exquisitely painful but there is no odor or purulent drainage. No erythema or induration. The right lateral ankle wound looks about the same today, again with thick rubbery slough. 01/03/2022: The left medial ankle wound has closed again. Both right ankle wounds appear to be about the same size with thick rubbery slough. The dorsal foot wound on the right continues to be quite painful  and she stated that she did not want any debridement of that site today. 01/10/2022: No real change to any of her wounds. She continues to accumulate thick slough. The dorsal foot wound has merged with the lateral malleolar wound. She is experiencing significant pain in the dorsal foot portion of the ulcer. 01/16/2022: Absolutely no change or progress in her wounds. 01/24/2022: Her wounds are unchanged. She continues to build up slough and the wounds on her dorsal right foot are still exquisitely tender. 01/31/2022: The wounds actually measure a little bit narrower today. They still have thick slough on the surface but the underlying tissue seems a little less fibrotic. We changed to Iodosorb last week. 02/07/2022: Wounds continue to slowly epithelialize around the parameters. She has less pain today. She still accumulates a fairly substantial layer of slough. 02/15/2022: No change to the wounds overall. The measured a little bit larger today per the intake nurse. She continues to have substantial slough accumulation and her pain is a little bit worse. 02/21/2022. No change at all to her  wounds. I do not see that plastic surgery has received her referral yet. Patient History Information obtained from Patient. Family History Diabetes - Mother, Lung Disease - Mother, No family history of Cancer, Heart Disease, Hereditary Spherocytosis, Hypertension, Kidney Disease, Seizures, Stroke, Thyroid Problems, Tuberculosis. Social History Never smoker, Marital Status - Married, Alcohol Use - Never, Drug Use - No History, Caffeine Use - Daily. Medical History Eyes Denies history of Cataracts, Glaucoma, Optic Neuritis Ear/Nose/Mouth/Throat Denies history of Chronic sinus problems/congestion, Middle ear problems Hematologic/Lymphatic Patient has history of Anemia, Sickle Cell Disease Denies history of Hemophilia, Human Immunodeficiency Virus, Lymphedema Respiratory Denies history of Aspiration, Asthma, Chronic Obstructive Pulmonary Disease (COPD), Pneumothorax, Sleep Apnea, Tuberculosis Cardiovascular Denies history of Angina, Arrhythmia, Congestive Heart Failure, Coronary Artery Disease, Deep Vein Thrombosis, Hypertension, Hypotension, Myocardial Infarction, Peripheral Arterial Disease, Peripheral Venous Disease, Phlebitis, Vasculitis Gastrointestinal Denies history of Cirrhosis , Colitis, Crohnoos, Hepatitis A, Hepatitis B, Hepatitis C Endocrine Denies history of Type I Diabetes, Type II Diabetes Genitourinary Denies history of End Stage Renal Disease Immunological Denies history of Lupus Erythematosus, Raynaudoos, Scleroderma Integumentary (Skin) Denies history of History of Burn Musculoskeletal Denies history of Gout, Rheumatoid Arthritis, Osteoarthritis, Osteomyelitis Neurologic Patient has history of Neuropathy - right foot intermittant Denies history of Dementia, Quadriplegia, Paraplegia, Seizure Disorder Oncologic Denies history of Received Chemotherapy, Received Radiation Psychiatric Denies history of Anorexia/bulimia, Confinement Anxiety Hospitalization/Surgery  History - c section x2. - left breast lumpectomy. - iandD right ankle with theraskin. Medical A Surgical History Notes nd Constitutional Symptoms (General Health) H/O miscarriage Cardiovascular bradycardia Gastrointestinal cholilithiasis Objective Constitutional No acute distress.. Vitals Time Taken: 10:43 AM, Height: 67 in, Temperature: 98.4 F, Pulse: 79 bpm, Respiratory Rate: 18 breaths/min, Blood Pressure: 136/63 mmHg. Respiratory Normal work of breathing on room air.. General Notes: 02/21/2022: There has been absolutely no change to her wounds whatsoever. They remain fibrotic and painful. Integumentary (Hair, Skin) Wound #17 status is Open. Original cause of wound was Gradually Appeared. The date acquired was: 10/05/2012. The wound has been in treatment 44 weeks. The wound is located on the Right,Lateral Lower Leg. The wound measures 7cm length x 6.2cm width x 0.1cm depth; 34.086cm^2 area and 3.409cm^3 volume. There is Fat Layer (Subcutaneous Tissue) exposed. There is no tunneling or undermining noted. There is a large amount of serosanguineous drainage noted. The wound margin is distinct with the outline attached to the wound base. There is small (1-33%) pink, pale granulation within  the wound bed. There is a large (67-100%) amount of necrotic tissue within the wound bed including Adherent Slough. The periwound skin appearance had no abnormalities noted for texture. The periwound skin appearance had no abnormalities noted for moisture. The periwound skin appearance had no abnormalities noted for color. Periwound temperature was noted as No Abnormality. Wound #21 status is Open. Original cause of wound was Gradually Appeared. The date acquired was: 06/26/2021. The wound has been in treatment 34 weeks. The wound is located on the Right,Medial Ankle. The wound measures 7.5cm length x 4.3cm width x 0.1cm depth; 25.329cm^2 area and 2.533cm^3 volume. There is Fat Layer (Subcutaneous Tissue)  exposed. There is no tunneling or undermining noted. There is a medium amount of serosanguineous drainage noted. The wound margin is distinct with the outline attached to the wound base. There is small (1-33%) pink granulation within the wound bed. There is a large (67- 100%) amount of necrotic tissue within the wound bed including Adherent Slough. The periwound skin appearance had no abnormalities noted for texture. The periwound skin appearance had no abnormalities noted for moisture. The periwound skin appearance had no abnormalities noted for color. Periwound temperature was noted as No Abnormality. 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 There was a Selective/Open Wound Non-Viable Tissue Debridement with a total area of 43.4 sq cm performed by Fredirick Maudlin, MD. With the following instrument(s): Curette to remove Non-Viable tissue/material. Material removed includes [facility] after achieving pain control using Lidocaine 5% topical ointment. No specimens were taken. A time out was conducted at 11:16, prior to the start of the procedure. A Minimum amount of bleeding was controlled with Pressure. The procedure was tolerated well with a pain level of 3 throughout and a pain level of 3 following the procedure. Post Debridement Measurements: 7cm length x 6.2cm width x 0.1cm depth; 3.409cm^3 volume. Character of Wound/Ulcer Post Debridement is improved. Post procedure Diagnosis Wound #17: Same as Pre-Procedure Wound #21 Pre-procedure diagnosis of Wound #21 is a Sickle Cell Lesion located on the Right,Medial Ankle .Severity of Tissue Pre Debridement is: Fat layer exposed. There was a Selective/Open Wound Non-Viable Tissue Debridement with a total area of 32.25  sq cm performed by Fredirick Maudlin, MD. With the following instrument(s): Curette to remove Non-Viable tissue/material. Material removed includes [facility] after achieving pain control using Lidocaine 5% topical ointment. No specimens were taken. A time out was conducted at 11:16, prior to the start of the procedure. A Minimum amount of bleeding was controlled with Pressure. The procedure was tolerated well with a pain level of 3 throughout and a pain level of 3 following the procedure. Post Debridement Measurements: 7.5cm length x 4.3cm width x 0.1cm depth; 2.533cm^3 volume. Character of Wound/Ulcer Post Debridement is improved. Severity of Tissue Post Debridement is: Fat layer exposed. Post procedure Diagnosis Wound #21: Same as Pre-Procedure Plan Follow-up Appointments: Return Appointment in 1 week. - Dr. Celine [name] Room 3 Anesthetic: (In clinic) Topical Lidocaine 4% applied to wound bed - In Clinic Bathing/ Shower/ Hygiene: May shower with protection but do not get wound dressing(s) wet. - Can get cast protector bags at [facility] or [facility] Edema Control - Lymphedema / SCD / Other: Elevate legs to the level of the heart or above for 30 minutes daily and/or when sitting, a frequency of: -  throughout the day Avoid standing for long periods of time. Exercise regularly Additional Orders / Instructions: Follow Nutritious Diet WOUND #17: - Lower Leg Wound Laterality: Right, Lateral Cleanser: Soap and Water Every Other Day/30 Days Discharge Instructions: May shower and wash wound with dial antibacterial soap and water prior to dressing change. Cleanser: Wound Cleanser Every Other Day/30 Days Discharge Instructions: Cleanse the wound with wound cleanser prior to applying a clean dressing using gauze sponges, not tissue or cotton balls. Peri-Wound Care: Triamcinolone 15 (g) Every Other Day/30 Days Discharge Instructions: Use triamcinolone 15 (g) as directed Peri-Wound Care: Sween Lotion (Moisturizing  lotion) Every Other Day/30 Days Discharge Instructions: Apply moisturizing lotion as directed Prim Dressing: IODOFLEX 0.9% Cadexomer Iodine Pad 4x6 cm Every Other Day/30 Days ary Discharge Instructions: Apply to wound bed as instructed Secondary Dressing: ABD Pad, 5x9 Every Other Day/30 Days Discharge Instructions: Apply over primary dressing as directed. Secondary Dressing: Zetuvit Plus 4x8 in Every Other Day/30 Days Discharge Instructions: Apply over primary dressing as directed. Secured With: Elastic Bandage 4 inch (ACE bandage) Every Other Day/30 Days Discharge Instructions: Secure with ACE bandage as directed. Com pression Wrap: Kerlix Roll 4.5x3.1 (in/yd) Every Other Day/30 Days Discharge Instructions: Apply Kerlix and Coban compression as directed. WOUND #21: - Ankle Wound Laterality: Right, Medial Cleanser: Soap and Water Every Other Day/30 Days Discharge Instructions: May shower and wash wound with dial antibacterial soap and water prior to dressing change. Cleanser: Wound Cleanser Every Other Day/30 Days Discharge Instructions: Cleanse the wound with wound cleanser prior to applying a clean dressing using gauze sponges, not tissue or cotton balls. Peri-Wound Care: Triamcinolone 15 (g) Every Other Day/30 Days Discharge Instructions: Use triamcinolone 15 (g) as directed Peri-Wound Care: Sween Lotion (Moisturizing lotion) Every Other Day/30 Days Discharge Instructions: Apply moisturizing lotion as directed Prim Dressing: IODOFLEX 0.9% Cadexomer Iodine Pad 4x6 cm Every Other Day/30 Days ary Discharge Instructions: Apply to wound bed as instructed Secondary Dressing: ABD Pad, 5x9 Every Other Day/30 Days Discharge Instructions: Apply over primary dressing as directed. Secondary Dressing: Zetuvit Plus 4x8 in Every Other Day/30 Days Discharge Instructions: Apply over primary dressing as directed. Secured With: Elastic Bandage 4 inch (ACE bandage) Every Other Day/30 Days Discharge  Instructions: Secure with ACE bandage as directed. Com pression Wrap: Kerlix Roll 4.5x3.1 (in/yd) Every Other Day/30 Days Discharge Instructions: Apply Kerlix and Coban compression as directed. 02/21/2022: There has been absolutely no change to her wounds whatsoever. They remain fibrotic and painful. I debrided slough from the wound surfaces, but was unable to be particularly aggressive secondary to her discomfort. I have personally messaged the plastic surgeons in the [place] group and we will resend the referral to them. I just do not foresee these wounds improving at all without more aggressive intervention that cannot be accomplished in clinic. For now we will continue using Iodoflex with Kerlix and ace wrap. Follow-up in 1 week. Electronic Signature(s) Signed: 02/21/2022 11:50:48 AM By: Fredirick Maudlin MD FACS Entered By: Fredirick Maudlin on 02/21/2022 11:50:48 -------------------------------------------------------------------------------- HxROS Details Patient Name: Date of Service: [name], [name] 02/21/2022 10:15 A M Medical Record Number: [id number] Patient Account Number: [id number] Date of Birth/Sex: Treating RN: 1971-01-12 (51 y.o. F) Primary Care Provider: [name] Other Clinician: Referring Provider: Treating Provider/Extender: Elyse Jarvis Weeks in Treatment: 6 Information Obtained From Patient Constitutional Symptoms (General Health) Medical History: Past Medical History Notes: H/O miscarriage Eyes Medical History: Negative for: Cataracts; Glaucoma; Optic Neuritis Ear/Nose/Mouth/Throat Medical History: Negative for: Chronic sinus  problems/congestion; Middle ear problems Hematologic/Lymphatic Medical History: Positive for: Anemia; Sickle Cell Disease Negative for: Hemophilia; Human Immunodeficiency Virus; Lymphedema Respiratory Medical History: Negative for: Aspiration; Asthma; Chronic Obstructive Pulmonary Disease (COPD);  Pneumothorax; Sleep Apnea; Tuberculosis Cardiovascular Medical History: Negative for: Angina; Arrhythmia; Congestive Heart Failure; Coronary Artery Disease; Deep Vein Thrombosis; Hypertension; Hypotension; Myocardial Infarction; Peripheral Arterial Disease; Peripheral Venous Disease; Phlebitis; Vasculitis Past Medical History Notes: bradycardia Gastrointestinal Medical History: Negative for: Cirrhosis ; Colitis; Crohns; Hepatitis A; Hepatitis B; Hepatitis C Past Medical History Notes: cholilithiasis Endocrine Medical History: Negative for: Type I Diabetes; Type II Diabetes Genitourinary Medical History: Negative for: End Stage Renal Disease Immunological Medical History: Negative for: Lupus Erythematosus; Raynauds; Scleroderma Integumentary (Skin) Medical History: Negative for: History of Burn Musculoskeletal Medical History: Negative for: Gout; Rheumatoid Arthritis; Osteoarthritis; Osteomyelitis Neurologic Medical History: Positive for: Neuropathy - right foot intermittant Negative for: Dementia; Quadriplegia; Paraplegia; Seizure Disorder Oncologic Medical History: Negative for: Received Chemotherapy; Received Radiation Psychiatric Medical History: Negative for: Anorexia/bulimia; Confinement Anxiety Immunizations Pneumococcal Vaccine: Received Pneumococcal Vaccination: No Implantable Devices None Hospitalization / Surgery History Type of Hospitalization/Surgery c section x2 left breast lumpectomy iandD right ankle with theraskin Family and Social History Cancer: No; Diabetes: Yes - Mother; Heart Disease: No; Hereditary Spherocytosis: No; Hypertension: No; Kidney Disease: No; Lung Disease: Yes - Mother; Seizures: No; Stroke: No; Thyroid Problems: No; Tuberculosis: No; Never smoker; Marital Status - Married; Alcohol Use: Never; Drug Use: No History; Caffeine Use: Daily; Financial Concerns: No; Food, Clothing or Shelter Needs: No; Support System Lacking: No;  Transportation Concerns: No Electronic Signature(s) Signed: 02/21/2022 12:28:30 PM By: Fredirick Maudlin MD FACS Entered By: Fredirick Maudlin on 02/21/2022 11:44:50 -------------------------------------------------------------------------------- SuperBill Details Patient Name: Date of Service: [place], [name] 02/21/2022 Medical Record Number: [id number] Patient Account Number: [id number] Date of Birth/Sex: Treating RN: February 21, 1971 (51 y.o. 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 Facility Procedures CPT4 Code: [id number] Description: 7597 - DEBRIDE WOUND 1ST 20 SQ CM OR < ICD-10 Diagnosis Description L97.818 Non-pressure chronic ulcer of other part of right lower leg with other specified s Modifier: everity Quantity: 1 CPT4 Code: [id number] Description: 7598 - DEBRIDE WOUND EA ADDL 20 SQ CM ICD-10 Diagnosis Description L97.818 Non-pressure chronic ulcer of other part of right lower leg with other specified s Modifier: everity Quantity: 3 Physician Procedures : CPT4 Code Description Modifier [id number] - WC PHYS LEVEL 4 - EST PT 25 ICD-10 Diagnosis Description L97.818 Non-pressure chronic ulcer of other part of right lower leg with other specified severity 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

## 2022-02-26 NOTE — Progress Notes (Signed)
[name] ([id number]) [id number] Page 1 of 9 Visit Report for 02/21/2022 Arrival Information Details Patient Name: Date of Service: [name] [name] 02/21/2022 10:15 A M Medical Record Number: [id number] Patient Account Number: [id number] Date of Birth/Sex: Treating RN: [name] (51 y.o. [name] Primary Care [name]: [name] Other Clinician: Referring [name]: Treating [name]/Extender: [name] in Treatment: 1 Visit Information History Since Last Visit Added or deleted any medications: No Patient Arrived: Ambulatory Any new allergies or adverse reactions: No Arrival Time: 10:43 Had a fall or experienced change in No Accompanied By: self activities of daily living that may affect Transfer Assistance: None risk of falls: Patient Identification Verified: Yes Signs or symptoms of abuse/neglect since last visito No Secondary Verification Process Completed: Yes Hospitalized since last visit: No Patient Requires Transmission-Based Precautions: No Implantable device outside of the clinic excluding No Patient Has Alerts: No cellular tissue based products placed in the center since last visit: Has Dressing in Place as Prescribed: Yes Has Compression in Place as Prescribed: Yes Pain Present Now: Yes Electronic Signature(s) Signed: 02/21/2022 6:18:14 PM By: [name] Gouty RN, BSN Entered By: [name] on 02/21/2022 10:43:52 -------------------------------------------------------------------------------- Encounter Discharge Information Details Patient Name: Date of Service: [name], [name] 02/21/2022 10:15 A M Medical Record Number: [id number] Patient Account Number: [id number] Date of Birth/Sex: Treating RN: [name] (51 y.o. [name] Primary Care [name]: [name] Other Clinician: Referring Maevyn Riordan: Treating [name]/Extender: [name] in  Treatment: 47 Encounter Discharge Information Items Post Procedure Vitals Discharge Condition: Stable Temperature (F): 98.4 Ambulatory Status: Cane Pulse (bpm): 79 Discharge Destination: Home Respiratory Rate (breaths/min): 18 Transportation: Private Auto Blood Pressure (mmHg): 136/63 Accompanied By: self Schedule Follow-up Appointment: Yes Clinical Summary of Care: Patient Declined Electronic Signature(s) Signed: 02/26/2022 3:12:14 PM By: [name] Creamer RN, BSN Entered By: [name] on 02/21/2022 12:09:55 [name] ([id number]) [id number] Page 2 of 9 -------------------------------------------------------------------------------- Lower Extremity Assessment Details Patient Name: Date of Service: [name] [name] 02/21/2022 10:15 A M Medical Record Number: [id number] Patient Account Number: [id number] Date of Birth/Sex: Treating RN: [name] (51 y.o. [name] Primary Care Xzavior Reinig: [name] Other Clinician: Referring [name]: Treating [name]/Extender: [name] in Treatment: 44 Edema Assessment Assessed: [Left: No] [Right: No] Edema: [Left: Ye] [Right: s] Calf Left: Right: Point of Measurement: 33 cm From Medial Instep 30.3 cm Ankle Left: Right: Point of Measurement: 10 cm From Medial Instep 19.5 cm Vascular Assessment Pulses: Dorsalis Pedis Palpable: [Right:Yes] Electronic Signature(s) Signed: 02/21/2022 6:18:14 PM By: [name] Gouty RN, BSN Entered By: [name] on 02/21/2022 10:47:44 -------------------------------------------------------------------------------- Multi Wound Chart Details Patient Name: Date of Service: [name], [name] 02/21/2022 10:15 A M Medical Record Number: [id number] Patient Account Number: [id number] Date of Birth/Sex: Treating RN: [birth year] (51 y.o. F) Primary Care Natilee Gauer: [name] Other Clinician: Referring [name]: Treating  [name]/Extender: [name] in Treatment: 69 Vital Signs Height(in): 67 Pulse(bpm): 79 Weight(lbs): Blood Pressure(mmHg): 136/63 Body Mass Index(BMI): Temperature(F): 98.4 Respiratory Rate(breaths/min): 18 [17:Photos:] [N/A:N/A [id number] Page 3 of 9] Right, Lateral Lower Leg Right, Medial Ankle N/A Wound Location: Gradually Appeared Gradually Appeared N/A Wounding Event: [name] Cell Lesion [name] Cell Lesion N/A Primary Etiology: N/A Venous Leg Ulcer N/A Secondary Etiology: Anemia, [name] Cell Disease, Anemia, [name] Cell Disease, N/A Comorbid History: Neuropathy Neuropathy 10/05/2012 06/26/2021 N/A Date Acquired: 79 34 N/A [name] of Treatment: Open Open N/A Wound Status: No No N/A Wound  Recurrence: Yes No N/A Clustered Wound: 7x6.2x0.1 7.5x4.3x0.1 N/A Measurements L x W x D (cm) 34.086 25.329 N/A A (cm) : rea 3.409 2.533 N/A Volume (cm) : 81.90% 12.60% N/A % Reduction in A rea: 81.90% 12.60% N/A % Reduction in Volume: Full Thickness Without Exposed Full Thickness Without Exposed N/A Classification: Support Structures Support Structures Large Medium N/A Exudate A mount: Serosanguineous Serosanguineous N/A Exudate Type: red, brown red, brown N/A Exudate Color: Distinct, outline attached Distinct, outline attached N/A Wound Margin: Small (1-33%) Small (1-33%) N/A Granulation A mount: Pink, Pale Pink N/A Granulation Quality: Large (67-100%) Large (67-100%) N/A Necrotic A mount: Fat Layer (Subcutaneous Tissue): Yes Fat Layer (Subcutaneous Tissue): Yes N/A Exposed Structures: Fascia: No Fascia: No Tendon: No Tendon: No Muscle: No Muscle: No Joint: No Joint: No Bone: No Bone: No Small (1-33%) Small (1-33%) N/A Epithelialization: Debridement - Selective/Open Wound Debridement - Selective/Open Wound N/A Debridement: Pre-procedure Verification/Time Out 11:16 11:16 N/A Taken: Lidocaine 5%  topical ointment Lidocaine 5% topical ointment N/A Pain Control: [name] N/A Tissue Debrided: Non-Viable Tissue Non-Viable Tissue N/A Level: 43.4 32.25 N/A Debridement A (sq cm): rea Curette Curette N/A Instrument: Minimum Minimum N/A Bleeding: Pressure Pressure N/A Hemostasis A chieved: 3 3 N/A Procedural Pain: 3 3 N/A Post Procedural Pain: Procedure was tolerated well Procedure was tolerated well N/A Debridement Treatment Response: 7x6.2x0.1 7.5x4.3x0.1 N/A Post Debridement Measurements L x W x D (cm) 3.409 2.533 N/A Post Debridement Volume: (cm) No Abnormalities Noted No Abnormalities Noted N/A Periwound Skin Texture: No Abnormalities Noted No Abnormalities Noted N/A Periwound Skin Moisture: No Abnormalities Noted No Abnormalities Noted N/A Periwound Skin Color: No Abnormality No Abnormality N/A Temperature: Debridement Debridement N/A Procedures Performed: Treatment Notes Electronic Signature(s) Signed: 02/21/2022 11:40:25 AM By: Fredirick Maudlin MD FACS Entered By: Fredirick Maudlin on 02/21/2022 11:40:25 -------------------------------------------------------------------------------- Multi-Disciplinary Care Plan Details Patient Name: Date of Service: [name], [name] 02/21/2022 10:15 A M Medical Record Number: [id number] Patient Account Number: [id number] Date of Birth/Sex: Treating RN: [birth year] [name] (51 y.o. [name] Primary Care Silveria Botz: [name] Other Clinician: Referring Neville Walston: Treating Maxmillian Carsey/Extender: [name] in Treatment: [place] reviewed with physician SITARA, CASHWELL ([id number]) [id number] Page 4 of 9 Active Inactive Venous Leg Ulcer Nursing Diagnoses: Actual venous Insuffiency (use after diagnosis is confirmed) Knowledge deficit related to disease process and management Goals: Patient will maintain optimal edema control Date Initiated:  06/26/2021 Target Resolution Date: 05/19/2022 Goal Status: Active Interventions: Assess peripheral edema status every visit. Compression as ordered Treatment Activities: Therapeutic compression applied : 06/26/2021 Notes: Wound/Skin Impairment Nursing Diagnoses: Impaired tissue integrity Goals: Patient/caregiver will verbalize understanding of skin care regimen Date Initiated: 04/17/2021 Target Resolution Date: 05/19/2022 Goal Status: Active Ulcer/skin breakdown will have a volume reduction of 30% by week 4 Date Initiated: 04/17/2021 Date Inactivated: 05/29/2021 Target Resolution Date: 05/15/2021 Goal Status: Met Ulcer/skin breakdown will have a volume reduction of 50% by week 8 Date Initiated: 05/29/2021 Date Inactivated: 06/26/2021 Target Resolution Date: 06/26/2021 Goal Status: Unmet Unmet Reason: venous reflux Interventions: Assess patient/caregiver ability to obtain necessary supplies Assess patient/caregiver ability to perform ulcer/skin care regimen upon admission and as needed Assess ulceration(s) every visit Provide education on ulcer and skin care Treatment Activities: Topical wound management initiated : 04/17/2021 Notes: 06/08/21: Left leg wounds greater than 30% volume reduction, right leg acute infection. Electronic Signature(s) Signed: 02/21/2022 6:18:14 PM By: [name] Gouty RN, BSN Entered By: [name] on 02/21/2022 11:19:54 -------------------------------------------------------------------------------- Pain Assessment Details Patient Name: Date  of Service: [name],  [name] 02/21/2022 10:15 A M Medical Record Number: [id number] Patient Account Number: [id number] Date of Birth/Sex: Treating RN: [name][birth year] (51 y.o. [name] Primary Care Taiga Lupinacci: [name] Other Clinician: Referring Kailani Brass: Treating Gelene Recktenwald/Extender: [name] in Treatment: 54 Active Problems Location of Pain Severity and Description of  Pain Patient Has [name] Site Locations [place], [place] ([id number]) [id number] Page 5 of 9 Site Locations Pain Location: Pain in Ulcers With Dressing Change: Yes Rate the pain. Current Pain Level: 4 Worst Pain Level: 6 Least Pain Level: 0 Character of Pain Describe the Pain: Burning Pain Management and Medication Current Pain Management: Medication: Yes Is the Current Pain Management Adequate: Adequate How does your wound impact your activities of daily livingo Sleep: Yes Bathing: No Appetite: No Relationship With Others: No Bladder Continence: No Emotions: No Bowel Continence: No Work: No Toileting: No Drive: No Dressing: No Hobbies: No Electronic Signature(s) Signed: 02/21/2022 6:18:14 PM By: [name] Gouty RN, BSN Entered By: [name] on 02/21/2022 10:45:10 -------------------------------------------------------------------------------- Patient/Caregiver Education Details Patient Name: Date of Service: [name], [name] 10/5/2023andnbsp10:15 A M Medical Record Number: [id number] Patient Account Number: [id number] Date of Birth/Gender: Treating RN: [birth year] (51 y.o. [name] Primary Care Physician: [name] Other Clinician: Referring Physician: Treating Physician/Extender: Hilarie Fredrickson in Treatment: 66 Education Assessment Education Provided To: Patient Education Topics Provided Wound/Skin Impairment: Methods: Explain/Verbal Responses: State content correctly Electronic Signature(s) Signed: 02/21/2022 6:18:14 PM By: [name] Gouty RN, BSN Entered By: [name] on 02/21/2022 11:20:10 [name] ([id number]) [id number] Page 6 of 9 -------------------------------------------------------------------------------- Wound Assessment Details Patient Name: Date of Service: [name] [name] 02/21/2022 10:15 A M Medical Record Number:  [id number] Patient Account Number: [id number] Date of Birth/Sex: Treating RN: [name] (51 y.o. [name] Primary Care Lyriq Finerty: [name] Other Clinician: Referring Alim Cattell: Treating Teniyah Seivert/Extender: [name] in Treatment: 44 Wound Status Wound Number: 17 Primary Etiology: [name] Cell Lesion Wound Location: Right, Lateral Lower Leg Wound Status: Open Wounding Event: Gradually Appeared Comorbid History: Anemia, [name] Cell Disease, Neuropathy Date Acquired: 10/05/2012 [name] Of Treatment: 44 Clustered Wound: Yes Photos Wound Measurements Length: (cm) 7 Width: (cm) 6.2 Depth: (cm) 0.1 Area: (cm) 34.086 Volume: (cm) 3.409 % Reduction in Area: 81.9% % Reduction in Volume: 81.9% Epithelialization: Small (1-33%) Tunneling: No Undermining: No Wound Description Classification: Full Thickness Without Exposed Support Structures Wound Margin: Distinct, outline attached Exudate Amount: Large Exudate Type: Serosanguineous Exudate Color: red, brown Foul Odor After Cleansing: No Slough/Fibrino Yes Wound Bed Granulation Amount: Small (1-33%) Exposed Structure Granulation Quality: Pink, Pale Fascia Exposed: No Necrotic Amount: Large (67-100%) Fat Layer (Subcutaneous Tissue) Exposed: Yes Necrotic Quality: Adherent Slough Tendon Exposed: No Muscle Exposed: No Joint Exposed: No Bone Exposed: No Periwound Skin Texture Texture Color No Abnormalities Noted: Yes No Abnormalities Noted: Yes Moisture Temperature / Pain No Abnormalities Noted: Yes Temperature: No Abnormality Treatment Notes Wound #17 (Lower Leg) Wound Laterality: Right, Lateral Cleanser Soap and Water Discharge Instruction: May shower and wash wound with dial antibacterial soap and water prior to dressing change. [name], [name] ([id number]) [id number] Page 7 of 9 Wound Cleanser Discharge Instruction: Cleanse the wound with wound cleanser  prior to applying a clean dressing using gauze sponges, not tissue or cotton balls. Peri-Wound Care Triamcinolone 15 (g) Discharge Instruction: Use triamcinolone 15 (g) as directed Sween Lotion (Moisturizing lotion) Discharge Instruction: Apply moisturizing lotion as directed Topical Primary Dressing IODOFLEX 0.9% Cadexomer Iodine  Pad 4x6 cm Discharge Instruction: Apply to wound bed as instructed Secondary Dressing ABD Pad, 5x9 Discharge Instruction: Apply over primary dressing as directed. Zetuvit Plus 4x8 in Discharge Instruction: Apply over primary dressing as directed. Secured With Elastic Bandage 4 inch (ACE bandage) Discharge Instruction: Secure with ACE bandage as directed. Compression Wrap Kerlix Roll 4.5x3.1 (in/yd) Discharge Instruction: Apply Kerlix and Coban compression as directed. Compression Stockings Add-Ons Electronic Signature(s) Signed: 02/21/2022 6:18:14 PM By: [name] Gouty RN, BSN Signed: 02/26/2022 3:12:14 PM By: [name] Creamer RN, BSN Entered By: [name] on 02/21/2022 10:55:48 -------------------------------------------------------------------------------- Wound Assessment Details Patient Name: Date of Service: [name], [name] 02/21/2022 10:15 A M Medical Record Number: [id number] Patient Account Number: [id number] Date of Birth/Sex: Treating RN: [birth year] (51 y.o. [name] Primary Care Yvonne Stopher: [name] Other Clinician: Referring Jamayah Myszka: Treating Fields Oros/Extender: [name] in Treatment: 13 Wound Status Wound Number: 21 Primary Etiology: [name] Cell Lesion Wound Location: Right, Medial Ankle Secondary Etiology: Venous Leg Ulcer Wounding Event: Gradually Appeared Wound Status: Open Date Acquired: 06/26/2021 Comorbid History: Anemia, [name] Cell Disease, Neuropathy [name] Of Treatment: 34 Clustered Wound: No Photos [name], [name] ([id number]) [id number] Page 8  of 9 Wound Measurements Length: (cm) 7.5 Width: (cm) 4.3 Depth: (cm) 0.1 Area: (cm) 25.329 Volume: (cm) 2.533 % Reduction in Area: 12.6% % Reduction in Volume: 12.6% Epithelialization: Small (1-33%) Tunneling: No Undermining: No Wound Description Classification: Full Thickness Without Exposed Support Structures Wound Margin: Distinct, outline attached Exudate Amount: Medium Exudate Type: Serosanguineous Exudate Color: red, brown Foul Odor After Cleansing: No Slough/Fibrino Yes Wound Bed Granulation Amount: Small (1-33%) Exposed Structure Granulation Quality: Pink Fascia Exposed: No Necrotic Amount: Large (67-100%) Fat Layer (Subcutaneous Tissue) Exposed: Yes Necrotic Quality: Adherent Slough Tendon Exposed: No Muscle Exposed: No Joint Exposed: No Bone Exposed: No Periwound Skin Texture Texture Color No Abnormalities Noted: Yes No Abnormalities Noted: Yes Moisture Temperature / Pain No Abnormalities Noted: Yes Temperature: No Abnormality Treatment Notes Wound #21 (Ankle) Wound Laterality: Right, Medial Cleanser Soap and Water Discharge Instruction: May shower and wash wound with dial antibacterial soap and water prior to dressing change. Wound Cleanser Discharge Instruction: Cleanse the wound with wound cleanser prior to applying a clean dressing using gauze sponges, not tissue or cotton balls. Peri-Wound Care Triamcinolone 15 (g) Discharge Instruction: Use triamcinolone 15 (g) as directed Sween Lotion (Moisturizing lotion) Discharge Instruction: Apply moisturizing lotion as directed Topical Primary Dressing IODOFLEX 0.9% Cadexomer Iodine Pad 4x6 cm Discharge Instruction: Apply to wound bed as instructed Secondary Dressing ABD Pad, 5x9 Discharge Instruction: Apply over primary dressing as directed. Zetuvit Plus 4x8 in Discharge Instruction: Apply over primary dressing as directed. Secured With Elastic Bandage 4 inch (ACE bandage) [name]  ([id number]) [id number] Page 9 of 9 Discharge Instruction: Secure with ACE bandage as directed. Compression Wrap Kerlix Roll 4.5x3.1 (in/yd) Discharge Instruction: Apply Kerlix and Coban compression as directed. Compression Stockings Add-Ons Electronic Signature(s) Signed: 02/21/2022 6:18:14 PM By: [name] Gouty RN, BSN Signed: 02/26/2022 3:12:14 PM By: [name] Creamer RN, BSN Entered By: [name] on 02/21/2022 10:56:40 -------------------------------------------------------------------------------- [place] Details Patient Name: Date of Service: [name], [name] 02/21/2022 10:15 A M Medical Record Number: [id number] Patient Account Number: [id number] Date of Birth/Sex: Treating RN: [birth year][name] (51 y.o. [name] Primary Care Morayo Leven: [name] Other Clinician: Referring Voncile Schwarz: Treating Maha Fischel/Extender: [name] in Treatment: 50 Vital Signs Time Taken: 10:43 Temperature (F): 98.4 Height (in): 67 Pulse (bpm): 79 Respiratory Rate (breaths/min): 18  Blood Pressure (mmHg): 136/63 Reference Range: 80 - 120 mg / dl Electronic Signature(s) Signed: 02/21/2022 6:18:14 PM By: [name] Gouty RN, BSN Entered By: [name] on 02/21/2022 10:44:18

## 2022-02-28 ENCOUNTER — Encounter (HOSPITAL_BASED_OUTPATIENT_CLINIC_OR_DEPARTMENT_OTHER): Payer: Medicaid Other | Admitting: General Surgery

## 2022-02-28 DIAGNOSIS — L97818 Non-pressure chronic ulcer of other part of right lower leg with other specified severity: Secondary | ICD-10-CM | POA: Diagnosis not present

## 2022-02-28 NOTE — Progress Notes (Signed)
[name], [name] ([id number]) [id number] Page 1 of 16 Visit Report for 02/28/2022 Chief Complaint Document Details Patient Name: Date of Service: [name] [name] 02/28/2022 10:00 A M Medical Record Number: [id number] Patient Account Number: [id number] Date of Birth/Sex: Treating RN: 1970-08-31 (51 y.o. F) Primary Care Provider: [name] Other Clinician: Referring Provider: Treating Provider/Extender: Elyse Jarvis Weeks in Treatment: 65 Information Obtained from: Patient Chief Complaint the patient is here for evaluation of her bilateral lower extremity sickle cell ulcers 04/17/2021; patient comes in for substantial wounds on the right and left lower leg Electronic Signature(s) Signed: 02/28/2022 10:54:02 AM By: Fredirick Maudlin MD FACS Entered By: Fredirick Maudlin on 02/28/2022 10:54:02 -------------------------------------------------------------------------------- Debridement Details Patient Name: Date of Service: [name] [name], [name] 02/28/2022 10:00 A M Medical Record Number: [id number] Patient Account Number: [id number] Date of Birth/Sex: Treating RN: 1970-08-01 (51 y.o. [name] Primary Care Provider: [name] Other Clinician: Referring Provider: Treating Provider/Extender: Elyse Jarvis Weeks in Treatment: 45 Debridement Performed for Assessment: Wound #21 Right,Medial Ankle Performed By: Physician Fredirick Maudlin, MD Debridement Type: Debridement Severity of Tissue Pre Debridement: Fat layer exposed Level of Consciousness (Pre-procedure): Awake and Alert Pre-procedure Verification/Time Out Yes - 10:39 Taken: Start Time: 10:39 Pain Control: Lidocaine 5% topical ointment T Area Debrided (L x W): otal 6 (cm) x 2 (cm) = 12 (cm) Tissue and other material debrided: Non-Viable, Eschar, Slough, Slough Level: Non-Viable Tissue Debridement Description: Selective/Open  Wound Instrument: Curette Bleeding: Minimum Hemostasis Achieved: Pressure Response to Treatment: Procedure was tolerated well Level of Consciousness (Post- Awake and Alert procedure): Post Debridement Measurements of Total Wound Length: (cm) 7.4 Width: (cm) 3 Depth: (cm) 0.1 Volume: (cm) 1.744 Character of Wound/Ulcer Post Debridement: Improved Severity of Tissue Post Debridement: Fat layer exposed [name] ([id number]) [id number] Page 2 of 16 Post Procedure Diagnosis Same as Pre-procedure Notes scribed for Dr. Celine Ahr by Adline Peals, RN Electronic Signature(s) Signed: 02/28/2022 11:49:46 AM By: Fredirick Maudlin MD FACS Signed: 02/28/2022 4:09:44 PM By: Adline Peals Entered By: Adline Peals on 02/28/2022 10:41:18 -------------------------------------------------------------------------------- Debridement Details Patient Name: Date of Service: [name] [name], [name] 02/28/2022 10:00 [place] Record Number: [id number] Patient Account Number: [id number] Date of Birth/Sex: Treating RN: [birth year] (51 y.o. [name] Primary Care Provider: [name] Other Clinician: Referring Provider: Treating Provider/Extender: Elyse Jarvis Weeks in Treatment: 45 Debridement Performed for Assessment: Wound #17 Right,Lateral Lower Leg Performed By: Physician Fredirick Maudlin, MD Debridement Type: Debridement Level of Consciousness (Pre-procedure): Awake and Alert Pre-procedure Verification/Time Out Yes - 10:39 Taken: Start Time: 10:39 Pain Control: Lidocaine 5% topical ointment T Area Debrided (L x W): otal 6.6 (cm) x 6.9 (cm) = 45.54 (cm) Tissue and other material debrided: Non-Viable, Eschar, Slough, Slough Level: Non-Viable Tissue Debridement Description: Selective/Open Wound Instrument: Curette Bleeding: Minimum Hemostasis Achieved: Pressure Response to Treatment: Procedure was tolerated  well Level of Consciousness (Post- Awake and Alert procedure): Post Debridement Measurements of Total Wound Length: (cm) 6.6 Width: (cm) 6.9 Depth: (cm) 0.1 Volume: (cm) 3.577 Character of Wound/Ulcer Post Debridement: Improved Post Procedure Diagnosis Same as Pre-procedure Notes scribed for Dr. Celine Ahr by Adline Peals, RN Electronic Signature(s) Signed: 02/28/2022 11:49:46 AM By: Fredirick Maudlin MD FACS Signed: 02/28/2022 4:09:44 PM By: Adline Peals Entered By: Adline Peals on 02/28/2022 11:32:18 HPI Details -------------------------------------------------------------------------------- [name] ([id number]) [id number] Page 3 of 16 Patient Name: Date of Service: [name], [name] [name] 02/28/2022 10:00 A M Medical Record Number: [id number] Patient Account  Number: [id number] Date of Birth/Sex: Treating RN: 1971-03-03 (51 y.o. F) Primary Care Provider: [name] Other Clinician: Referring Provider: Treating Provider/Extender: Elyse Jarvis Weeks in Treatment: 15 History of Present Illness Location: medial and lateral ankle region on the right and left medial malleolus Quality: Patient reports experiencing a shooting pain to affected area(s). Severity: Patient states wound(s) are getting worse. Duration: right lower extremity bimalleolar ulcers have been present for approximately 2 years; the rright meedial malleolus ulcer has been there proximally 6 months Timing: Pain in wound is constant (hurts all the time) Context: The wound would happen gradually ssociated Signs and Symptoms: Patient reports having increase discharge. A HPI Description: 51 year old patient with a history of sickle cell anemia who was last seen by me with ulceration of the right lower extremity above the ankle and was referred to Dr. Leland Johns for a surgical debridement as I was unable to do anything in the office due to excruciating  pain. At that stage she was referred from the plastic surgery service to dermatology who treated her for a skin infection with doxycycline and then Levaquin and a local antibiotic ointment. I understand the patient has since developed ulceration on the left ankle both medial and lateral and was now referred back to the wound center as dermatology has finished the management. I do not have any notes from the dermatology department Old notes: 51 year old patient with a history of sickle cell anemia, pain bilateral lower extremities, right lower extremity ulcer and has a history of receiving a skin graft( Theraskin) several months ago. She has been visiting the wound center [facility] and was seen by Dr. Dellia Nims and Dr. Leland Johns. after prolonged conservator therapy between July 2016 and January 2017. She had been seen by the plastic surgeon and taken to the OR for debridement and application of Theraskin. She had 3 applications of Theraskin and was then treated with collagen. Prior to that she had a history of similar problems in 2014 and was treated conservatively. Had a reflux study done for the right lower extremity in August 2016 without reflux or DVT . Past medical history significant for sickle cell disease, anemia, leg ulcers, cholelithiasis,and has never been a smoker. Once the patient was discharged on the wound center she says within 2 or 3 weeks the problems recurred and she has been treating it conservatively. since I saw her 3 weeks ago at [facility] she has been unable to get her dressing material but has completed a course of doxycycline. 6/7/ 2017 -- lower extremity venous duplex reflux evaluation was done No evidence of SVT or DVT in the RLL. No venous incompetence in the RLL. No further vascular workup is indicated at this time. She was seen by Dr. [name], on 10/04/2015. She agreed with the plan of taking her to the OR for debridement and application of theraskin and  would also take biopsies to rule out pyoderma gangrenosum. Follow-up note dated May 31 received and she was status post application of Theraskin to multiple ulcers around the right ankle. Pathology did not show evidence of malignancy or pyoderma gangrenosum. She would continue to see as in the wound clinic for further care and see Dr. Leland Johns as needed. The patient brought the biopsy report and it was consistent with stasis ulcer no evidence of malignancy and the comment was that there was some adjacent neovascularization, fibrosis and patchy perivascular chronic inflammation. 11/15/2015 -- today we applied her first application of Theraskin 11/30/15; TheraSkin #2 12/13/2015 --  she is having a lot of pain locally and is here for possible application of a theraskin today. 01/16/2016 -- the patient has significant pain and has noticed despite in spite of all local care and oral pain medication. It is impossible to debride her in the office. 02/06/2016 -- I do not see any notes from Dr. Iran Planas( the patient has not made a call to the office know as she heard from them) and the only visit to recently was with her PCP Dr. Danella Penton -- I saw her on 01/16/2016 and prescribed 90 tablets of oxycodone 10 mg and did lab work and screening for HIV. the HIV was negative and hemoglobin was 6.3 with a WBC count of 14.9 and hematocrit of 17.8 with platelets of 561. 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 The bimalleolar ulcers to the right lower extremity have been present for approximately 2 years, the left medial malleolus ulcer has been present approximately 6 months. She has followed with Dr.Thimmappa in the past and has had a total of 3 applications of Theraskin (01/2015, 09/2015, 06/17/16). She has also followed with Dr. Con Memos here in the clinic and has received 2 applications of  TheraSkin (11/10/15, 11/30/15). The patient does experience chronic, and is not amenable to debridement. 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 She is not currently on any antibiotic therapy and has not been treated with any recently. 07/17/2016 -- was seen by Dr. Iran Planas of plastic surgery who saw her 2 weeks postop application of Theraskin #3. She had removed her dressing and asked her to apply silver alginate on alternate days and follow-up back with the wound center. Future debridements and application of skin substitute would have to be done in the hospital due to her high risk for anesthesia. READMISSION 04/17/2021 Patient is now a 51 year old woman that we have had in this clinic for a prolonged period of time and 2016-2017 and then again for 2 visits in February 2018. At that point she had wounds on the right lower leg predominantly medial. She had also been seen by plastic surgery Dr. Leland Johns who I believe took her to the OR for operative debridement and application of TheraSkin in 2017. After she left our clinic she was followed for a very prolonged period of time in the wound care center in [facility] who then referred her ultimately to [facility] where she was seen by Dr. Vernona Rieger. Again taken her to the OR for skin grafting which apparently did not take. She had multiple other attempts at dressings although I have not really looked over all of these notes in great detail. She has not been seen in a wound care center in about a year. She states over the last year in addition to her right lower leg she has developed wounds on the left lower leg quite extensive. She is using Xeroform to all of these wounds without really any improvement. She also has Medicaid which does not cover wound products. The patient has had vascular work-ups in the past including most recently on 03/28/2021 showing biphasic waveforms on the right triphasic at the PTA  and biphasic at the dorsalis pedis on the left. She was unable to tolerate any degree of compression to do ABIs. Unfortunately TBI's were also not done. She had venous reflux studies done in 2017. This did not show any  evidence of a DVT or SVT and no venous incompetence was noted in the right leg at the time this was the only side with the wound As noted I did not look all over her old records. She apparently had a course of HBO and [facility] although I am not sure what the indication would have been. In any case she developed seizures and terminated treatment earlier. She is generally much more disabled than when we last saw her in clinic. She can no longer walk pretty much wheelchair-bound because predominantly of pain in the left hip. 04/24/2021; the patient tolerated the wraps we put on. We used Santyl and Hydrofera Blue under compression. I brought her back for a nurse visit for a change in dressing. With Medicaid we will have a hard time getting anything paid for and hence the need for compression. She arrives in clinic with all the wounds looking somewhat better in terms of surface 12/20; circumferential wound on the right from the lateral to the medial. She has open areas on the left medial and left lateral x2 on all of this with the same surface. This does not look completely healthy although she does have some epithelialization. She is not complaining of a lot of pain which is unusual for her sickle ulcers. ELFRIEDE, BONINI ([id number]) [id number] Page 4 of 16 I have not looked over her extensive records from [facility]. She had recent arterial studies and has a history of venous reflux studies I will need to look these over although I do not believe she has significant arterial disease 2023 05/22/2021; patient's wound areas measure slightly smaller. Still a lot of drainage coming from the right we have been using Hydrofera Blue and Santyl with some improvement in the  wound surfaces. She tells me she will be getting transfused later in the week for her underlying sickle cell anemia I have looked over her recent arterial studies which were done in the fall. This was in November and showed biphasic and triphasic waveforms but she could not tolerate ABIs because of pressure and unfortunately TBI's were not done. She has not had recent venous reflux studies that I can see 1/10; not much change about the same surface area. This has a yellowish surface to it very gritty. We have been using Santyl and Hydrofera Blue for a prolonged period. Culture I did last week showed methicillin sensitive staph aureus "rare". Our intake nurse reports greenish drainage which may be the Hydrofera Blue itself 1/17; wounds are continue to measure smaller although I am not sure about the accuracy here. Especially the areas on the right are covered in what looks to be a nonviable surface although she does have some epithelialization. Similarly she has areas on the left medial and left lateral ankle area which appear to have a better surface and perhaps are slightly smaller. We have been using Santyl and Hydrofera Blue. She cannot tolerate mechanical debridements She went for her reflux studies which showed significant reflux at the greater saphenous vein at the saphenofemoral junction as well as the greater saphenous vein in the proximal calf on the left she had reflux in the thigh and the common femoral vein and supra vein Fishel vein reflux in the greater saphenous vein. I will have vein and vascular look at this. My thoughts have been that these are likely sickle wounds. I looked through her old records from [facility] wound care center and then when she graduated to [facility] wound care center where  she saw Dr. Zigmund Daniel and Dr. Vernona Rieger. Although I can see she had reflux studies done I do not see that she actually saw a vein and vascular. I went over the fact that she had operative  debridements and actual skin grafting that did not take. I do not think these wounds have ever really progressed towards healing 1/31;Substantial wounds on the right ankle area. Hyper granulated very gritty adherent debris on the surface. 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 On the left she has no DVT or SVT deep vein reflux in the common femoral vein. Superficial vein reflux in the greater saphenous vein on although the vein diameter is not really all that large. I do not think there is anything that can be done with these although I am going to send her for consultation to vein and vascular. 2/7; Wound exam; substantial wound area on the right posterior ankle area and areas on the left medial ankle and left lateral ankle. I was able to debride the left medial ankle last week fairly aggressively and it is back this week to a completely nonviable surface She will see vascular surgery this Friday and I would like them to review the venous studies and also any comments on her arterial status. If they do not see an issue here I am going to refer her to plastic surgery for an operative debridement perhaps intraoperative ACell or Integra. Eventually she will require a deep tissue culture again 2/14; substantial wound area on the right posterior ankle, medial ankle. We have been using silver alginate The patient was seen by vein and vascular she had both venous reflux studies and arterial studies. 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 She had no evidence of  superficial venous thrombosis. She did have superficial vein reflux at the saphenofemoral junction and the greater saphenous vein. On the left no evidence of a DVT no evidence of superficial venous throat thrombosis she did have deep vein reflux in the common femoral vein and superficial vein reflux in the greater saphenous vein but these were not felt to be amenable to ablation. In terms of arterial studies she had triphasic and wife biphasic wave waveforms bilaterally not felt to have a significant arterial issue. I do not get the feeling that they felt that any part of her nonhealing wounds were related to either arterial or venous issues. They did note that she had venous reflux at the right at the [facility] and [facility]. And also on the left there were reflux in the deep system at the common femoral vein and greater saphenous vein in the proximal thigh. Nothing amenable to ablation. 2/20; she is making some decent progress on the right where there is nice skin between the 2 open areas on the right ankle. The surfaces here do not look viable yet there is some surrounding epithelialization. She still has a small area on the left medial ankle area. Hyper-granulated Jody's away always 2/28 patient has an appointment with plastic surgery on 3/8. We will see her back  on 3/9. She may have to call us to get the area redressed. We've been using Santyl under silver alginate. We made a nice improvement on the left medial ankle. The larger wounds on the right also looks somewhat better in terms of epithelialization although I think they could benefit from an aggressive debridement if plastic surgery would be willing to do that. Perhaps placement of Integra or a cell 07/26/2021: She saw Dr. Claudia Desanctis yesterday. He raised the question as to whether or not this might be pyoderma and wanted to wait until that question was answered by dermatology before proceeding with any sort of operative debridement. We have continue to use  Santyl under silver alginate with Kerlix and Coban wraps. Overall, her wounds appear to be continuing to contract and epithelialize, with some granulation tissue present. There continues to be some slough on all wound surfaces. 08/09/2021: She has not been able to get an appointment with dermatology because apparently the offices in [place] do not accept Medicaid. She is looking into whether or not she can be seen at the main [facility] dermatology clinic. This is necessary because plastic surgery is concerned that her wounds might represent pyoderma and they did not want to do any procedure until that was clarified. We have been using Santyl under silver alginate with Kerlix and Coban wraps. Today, there was a greater amount of drainage on her dressings with a slight green discoloration and significant odor. Despite this, her wounds continue to contract and epithelialize. There is pale granulation tissue present and actually, on the left medial ankle, the granulation tissue is a bit hypertrophic. 08/16/2021: Last week, I took a culture and this grew back rare methicillin-resistant Staph aureus and rare corynebacterium. The MRSA was sensitive to gentamicin which we began applying topically on an empiric basis. This week, her wounds are a bit smaller and the drainage and odor are less. Her primary care provider is working on assisting the patient with a dermatology evaluation. She has been in silver alginate over the gentamicin that was started last week along with Kerlix and Coban wraps. 08/23/2021: Because she has Medicaid, we have been unable to get her into see any dermatologist in the [place] to rule out pyoderma gangrenosum, which was a requirement from plastic surgery prior to any sort of debridement and grafting. Despite this, however, all of her wounds continue to get smaller. The wound on her left medial ankle is nearly closed. There is no odor from the wounds, although  she still accumulates a modest amount of drainage on her dressings. 08/30/2021: The lateral right ankle wound and the medial left ankle wound are a bit smaller today. The medial right ankle wound is about the same size. They are less tender. We have still been unable to get her into dermatology. 09/06/2021: All of the wounds are about the same size today. She continues to endorse minimal pain. I communicated with Dr. Claudia Desanctis in plastic surgery regarding our issues getting a dermatology appointment; he was out of town but indicated that he would look into perhaps performing the biopsy in his office and will have his office contact her. 09/14/2021: The patient has an appointment in dermatology, but it is not until October. Her wounds are roughly the same; she continues to have very thick purulent-looking drainage on her dressings. TEKLA, MALACHOWSKI ([id number]) [id number] Page 5 of 16 09/20/2021: The left medial wound is nearly closed and just has a bit of accumulated eschar on the surface.  The right medial and lateral ankle wounds are perhaps a little bit smaller. They continue to have a very pale surface with accumulation of thin slough. 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 I did not think [employer] was indicated based on this. She is getting topical mupirocin with Prisma silver collagen. 10/04/2021: The patient was not seen in clinic last week due to childcare coverage issues. In the interim, the left medial leg wound has closed. The right sided leg wounds are smaller. There is more granulation tissue coming through, particularly on the lateral wound. The surface remains somewhat gritty. We have been applying topical mupirocin and Prisma silver collagen. 10/11/2021: The left medial leg wound remains closed. She does complain of some anesthetic sensation to the area. Both of the right-sided leg wounds are smaller but still have accumulated  slough. 10/18/2021: Both right-sided leg wounds are minimally smaller this week. She still continues to accumulate slough and has thick drainage on her dressings. 10/23/2021: Both wounds continue to contract. There is still slough buildup. She has been approved for a keratin-based skin substitute trial product but it will not be available until next week. 10/30/2021: The wounds are about the same to perhaps slightly smaller. There is still continued slough buildup. Unfortunately, the rep for the keratin based product did not show up today and did not answer his phone when called. 11/08/2021: The wounds are little bit smaller today. She continues to have thick drainage but the surfaces are relatively clean with just a little bit of slough accumulation. She reported to me today that she is unable to completely flex her left ankle and on examination it seems this is potentially related to scar tissue from her wounds. We do have the ProgenaMatrix trial product available for her today. 11/15/2021: Both wounds are smaller today. There is some slough accumulation on the surfaces, but the medial wound, in particular looks like it is filling in and is less deep. She did hear from physical therapy and she is going to start working with them on July 11. She is here for her second application of the trial skin substitute, ProgenaMatrix. 11/22/2021: Both wounds continue to contract, the medial more dramatically than the lateral. Both wounds have a layer of slough on the surface, but underneath this, the gritty fibrous tissue has a little bit more of a pink cast to it rather than being as pale as it has been. 11/29/2021: The wounds are roughly the same size this week, perhaps a millimeter or 2 smaller. The medial wound has filled in and is nearly flush with the surrounding skin surface. She continues to have a lot of slough accumulation on both surfaces. 12/06/2021: No significant change to her wounds, but she has a new  opening on her dorsal foot, just distal to the right lateral ankle wound. The area on her left medial ankle that reopened looks a little bit larger today. She has quite a bit of pain associated with the new wound. 12/12/2021: Her wounds look about the same but the new opening on her right lateral dorsal foot is a little bit bigger. She continues to have a fair amount of pain with this wound. 12/26/2021: The left medial ankle wound is tiny and superficial. She has 2 areas of crusting on her left lateral ankle, however, that appear to be threatening to open again. Her right medial ankle wound is a little bit smaller today but still continues to accumulate thick rubbery slough. The  new dorsal foot wound is exquisitely painful but there is no odor or purulent drainage. No erythema or induration. The right lateral ankle wound looks about the same today, again with thick rubbery slough. 01/03/2022: The left medial ankle wound has closed again. Both right ankle wounds appear to be about the same size with thick rubbery slough. The dorsal foot wound on the right continues to be quite painful and she stated that she did not want any debridement of that site today. 01/10/2022: No real change to any of her wounds. She continues to accumulate thick slough. The dorsal foot wound has merged with the lateral malleolar wound. She is experiencing significant pain in the dorsal foot portion of the ulcer. 01/16/2022: Absolutely no change or progress in her wounds. 01/24/2022: Her wounds are unchanged. She continues to build up slough and the wounds on her dorsal right foot are still exquisitely tender. 01/31/2022: The wounds actually measure a little bit narrower today. They still have thick slough on the surface but the underlying tissue seems a little less fibrotic. We changed to Iodosorb last week. 02/07/2022: Wounds continue to slowly epithelialize around the parameters. She has less pain today. She still accumulates a  fairly substantial layer of slough. 02/15/2022: No change to the wounds overall. The measured a little bit larger today per the intake nurse. She continues to have substantial slough accumulation and her pain is a little bit worse. 02/21/2022. No change at all to her wounds. I do not see that plastic surgery has received her referral yet. 02/28/2022: The wounds remain unchanged. They are dry and fibrotic with accumulation of slough and eschar. She did receive an appointment to see plastic surgery on October 23, but the office called her back and indicated they needed to reschedule and that the next available appointment was not until December. The patient became angry and decided she did not want to see this plastics group. Electronic Signature(s) Signed: 02/28/2022 10:55:17 AM By: Fredirick Maudlin MD FACS Entered By: Fredirick Maudlin on 02/28/2022 10:55:17 -------------------------------------------------------------------------------- Physical Exam Details Patient Name: Date of Service: [name] [name], [name] 02/28/2022 10:00 A M Medical Record Number: [id number] Patient Account Number: [id number] Date of Birth/Sex: Treating RN: [birth year] (51 y.o. F) Primary Care Provider: [name] Other Clinician: Eula [name] ([id number]) [id number] Page 6 of 16 Referring Provider: Treating Provider/Extender: Elyse Jarvis Weeks in Treatment: 45 Constitutional . . . . No acute distress.Marland Kitchen Respiratory Normal work of breathing on room air.. Notes 02/28/2022: The wounds remain unchanged. They are dry and fibrotic with accumulation of slough and eschar. Electronic Signature(s) Signed: 02/28/2022 10:56:21 AM By: Fredirick Maudlin MD FACS Entered By: Fredirick Maudlin on 02/28/2022 10:56:21 -------------------------------------------------------------------------------- Physician Orders Details Patient Name: Date of Service: [name] [name], [name] 02/28/2022  10:00 A M Medical Record Number: [id number] Patient Account Number: [id number] Date of Birth/Sex: Treating RN: [birth year] (51 y.o. 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 - Dr. Celine Ahr Room 3 Return A Anesthetic (In clinic) Topical Lidocaine 5% applied to wound bed Bathing/ Shower/ Hygiene May shower with protection but do not get wound dressing(s) wet. - Can get cast protector bags at [facility] or [facility] Edema Control - Lymphedema /  SCD / Other Elevate legs to the level of the heart or above for 30 minutes daily and/or when sitting, a frequency of: - throughout the day Avoid standing for long periods of time. Exercise regularly Additional Orders / Instructions Follow Nutritious Diet Wound Treatment Wound #17 - Lower Leg Wound Laterality: Right, Lateral Cleanser: Soap and Water Every Other Day/30 Days Discharge Instructions: May shower and wash wound with dial antibacterial soap and water prior to dressing change. Cleanser: Wound Cleanser Every Other Day/30 Days Discharge Instructions: Cleanse the wound with wound cleanser prior to applying a clean dressing using gauze sponges, not tissue or cotton balls. Peri-Wound Care: Triamcinolone 15 (g) Every Other Day/30 Days Discharge Instructions: Use triamcinolone 15 (g) as directed Peri-Wound Care: Sween Lotion (Moisturizing lotion) Every Other Day/30 Days Discharge Instructions: Apply moisturizing lotion as directed Prim Dressing: MediHoney Gel, tube 1.5 (oz) Every Other Day/30 Days ary Discharge Instructions: Apply to wound bed as  instructed [name] ([id number]) [id number] Page 7 of 16 Secondary Dressing: ABD Pad, 5x9 Every Other Day/30 Days Discharge Instructions: Apply over primary dressing as directed. Secondary Dressing: Zetuvit Plus 4x8 in Every Other Day/30 Days Discharge Instructions: Apply over primary dressing as directed. Secured With: Elastic Bandage 4 inch (ACE bandage) Every Other Day/30 Days Discharge Instructions: Secure with ACE bandage as directed. Compression Wrap: Kerlix Roll 4.5x3.1 (in/yd) Every Other Day/30 Days Discharge Instructions: Apply Kerlix and Coban compression as directed. Wound #21 - Ankle Wound Laterality: Right, Medial Cleanser: Soap and Water Every Other Day/30 Days Discharge Instructions: May shower and wash wound with dial antibacterial soap and water prior to dressing change. Cleanser: Wound Cleanser Every Other Day/30 Days Discharge Instructions: Cleanse the wound with wound cleanser prior to applying a clean dressing using gauze sponges, not tissue or cotton balls. Peri-Wound Care: Triamcinolone 15 (g) Every Other Day/30 Days Discharge Instructions: Use triamcinolone 15 (g) as directed Peri-Wound Care: Sween Lotion (Moisturizing lotion) Every Other Day/30 Days Discharge Instructions: Apply moisturizing lotion as directed Prim Dressing: MediHoney Gel, tube 1.5 (oz) Every Other Day/30 Days ary Discharge Instructions: Apply to wound bed as instructed Secondary Dressing: ABD Pad, 5x9 Every Other Day/30 Days Discharge Instructions: Apply over primary dressing as directed. Secondary Dressing: Zetuvit Plus 4x8 in Every Other Day/30 Days Discharge Instructions: Apply over primary dressing as directed. Secured With: Elastic Bandage 4 inch (ACE bandage) Every Other Day/30 Days Discharge Instructions: Secure with ACE bandage as directed. Compression Wrap: Kerlix Roll 4.5x3.1 (in/yd) Every Other Day/30 Days Discharge Instructions: Apply Kerlix and  Coban compression as directed. [facility] - referral for surgical debridement for nonhealing sickle cell wounds to the right lower leg - biopsy in the past has showed these are not pyoderma - has seen Dr. Vernona Rieger in the past ICD10: L97.818 D57.1 - (ICD10 L97.818 - Non-pressure chronic ulcer of other part of right lower leg with other specified severity) Patient Medications llergies: No Known Allergies A Notifications Medication Indication Start End 02/28/2022 lidocaine DOSE topical 5 % ointment - ointment topical Electronic Signature(s) Signed: 02/28/2022 3:03:51 PM By: Fredirick Maudlin MD FACS Signed: 02/28/2022 4:09:44 PM By: Adline Peals Previous Signature: 02/28/2022 11:49:46 AM Version By: Fredirick Maudlin MD FACS Entered By: Adline Peals on 02/28/2022 14:09:23 Prescription 02/28/2022 -------------------------------------------------------------------------------- Wess Botts MD Patient Name: Provider: Aug 13, 1970 [id number] Date of Birth: NPI#: [id number] Sex: DEA#[name] ([id number]) [id number] Page 8 [id number] [phone] Phone #: License #: [place] Patient Address: [phone] [place]  [address] [place] [address] , [place], [place] [address] [phone] Allergies No Known Allergies Provider's Orders [facility] - ICD10: [phone] - referral for surgical debridement for nonhealing sickle cell wounds to the right lower leg - biopsy in the past has showed these are not pyoderma - has seen Dr. Vernona Rieger in the past ICD10: L97.818 D57.1 Hand Signature: Date(s): Electronic Signature(s) Signed: 02/28/2022 3:03:51 PM By: Fredirick Maudlin MD FACS Signed: 02/28/2022 4:09:44 PM By: Adline Peals Entered By: Adline Peals on 02/28/2022  14:09:25 -------------------------------------------------------------------------------- Problem List Details Patient Name: Date of Service: [name], [name] 02/28/2022 10:00 [place] Record Number: [id number] Patient Account Number: [id number] Date of Birth/Sex: Treating RN: [birth year] (51 y.o. F) Primary Care Provider: [name] Other Clinician: Referring Provider: Treating Provider/Extender: Elyse Jarvis Weeks in Treatment: 32 Active Problems ICD-10 Encounter Code Description Active Date MDM Diagnosis L97.818 Non-pressure chronic ulcer of other part of right lower leg with other specified 04/17/2021 No Yes severity 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 04/17/2021 No Yes Inactive Problems ICD-10 Code Description Active Date Inactive Date L97.828 Non-pressure chronic ulcer of other part of left lower leg with other specified severity 04/17/2021 04/17/2021 Resolved Problems Electronic Signature(s) Signed: 02/28/2022 10:50:31 AM By: Fredirick Maudlin MD FACS Entered By: Fredirick Maudlin on 02/28/2022 10:50:31 [name] ([id number]) [id number] Page 9 of 16 -------------------------------------------------------------------------------- Progress Note Details Patient Name: Date of Service: [name] [name] 02/28/2022 10:00 A M Medical Record Number: [id number] Patient Account Number: [id number] Date of Birth/Sex: Treating RN: [birth year] (51 y.o. F) Primary Care Provider: [name] Other Clinician: Referring Provider: Treating Provider/Extender: Elyse Jarvis Weeks in Treatment: 105 Subjective Chief Complaint Information obtained from Patient the patient is here for evaluation of her bilateral lower extremity sickle cell ulcers 04/17/2021; patient comes in for substantial wounds on the right and left lower leg History of Present Illness (HPI) The following HPI elements were documented  for the patient's wound: Location: medial and lateral ankle region on the right and left medial malleolus Quality: Patient reports experiencing a shooting pain to affected area(s). Severity: Patient states wound(s) are getting worse. Duration: right lower extremity bimalleolar ulcers have been present for approximately 2 years; the rright meedial malleolus ulcer has been there proximally 6 months Timing: Pain in wound is constant (hurts all the time) Context: The wound would happen gradually Associated Signs and Symptoms: Patient reports having increase discharge. 51 year old patient with a history of sickle cell anemia who was last seen by me with ulceration of the right lower extremity above the ankle and was referred to Dr. Leland Johns for a surgical debridement as I was unable to do anything in the office due to excruciating pain. At that stage she was referred from the plastic surgery service to dermatology who treated her for a skin infection with doxycycline and then Levaquin and a local antibiotic ointment. I understand the patient has since developed ulceration on the left ankle both medial and lateral and was now referred back to the wound center as dermatology has finished the management. I do not have any notes from the dermatology department Old notes: 51 year old patient with a history of sickle cell anemia, pain bilateral lower extremities, right lower extremity ulcer and has a history of receiving a skin graft( Theraskin) several months ago. She has been visiting the wound center [facility] and was seen by Dr. Dellia Nims and Dr. Leland Johns. after prolonged conservator therapy between  July 2016 and January 2017. She had been seen by the plastic surgeon and taken to the OR for debridement and application of Theraskin. She had 3 applications of Theraskin and was then treated with collagen. Prior to that she had a history of similar problems in 2014 and was treated conservatively. Had a  reflux study done for the right lower extremity in August 2016 without reflux or DVT . Past medical history significant for sickle cell disease, anemia, leg ulcers, cholelithiasis,and has never been a smoker. Once the patient was discharged on the wound center she says within 2 or 3 weeks the problems recurred and she has been treating it conservatively. since I saw her 3 weeks ago at [facility] she has been unable to get her dressing material but has completed a course of doxycycline. 6/7/ 2017 -- lower extremity venous duplex reflux evaluation was done oo No evidence of SVT or DVT in the RLL. No venous incompetence in the RLL. No further vascular workup is indicated at this time. She was seen by Dr. [name], on 10/04/2015. She agreed with the plan of taking her to the OR for debridement and application of theraskin and would also take biopsies to rule out pyoderma gangrenosum. Follow-up note dated May 31 received and she was status post application of Theraskin to multiple ulcers around the right ankle. Pathology did not show evidence of malignancy or pyoderma gangrenosum. She would continue to see as in the wound clinic for further care and see Dr. Leland Johns as needed. The patient brought the biopsy report and it was consistent with stasis ulcer no evidence of malignancy and the comment was that there was some adjacent neovascularization, fibrosis and patchy perivascular chronic inflammation. 11/15/2015 -- today we applied her first application of Theraskin 11/30/15; TheraSkin #2 12/13/2015 -- she is having a lot of pain locally and is here for possible application of a theraskin today. 01/16/2016 -- the patient has significant pain and has noticed despite in spite of all local care and oral pain medication. It is impossible to debride her in the office. 02/06/2016 -- I do not see any notes from Dr. Iran Planas( the patient has not made a call to the office know as she heard from them) and  the only visit to recently was with her PCP Dr. Danella Penton -- I saw her on 01/16/2016 and prescribed 90 tablets of oxycodone 10 mg and did lab work and screening for HIV. the HIV was negative and hemoglobin was 6.3 with a WBC count of 14.9 and hematocrit of 17.8 with platelets of 561. 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 The bimalleolar ulcers to the right lower extremity have been present for approximately 2 years, the left medial malleolus ulcer has been present approximately 6 months. She has followed with Dr.Thimmappa in the past and has had a total of 3 applications of Theraskin (01/2015, 09/2015, 06/17/16). She has also followed with Dr. Con Memos here in the clinic and has received 2 applications of TheraSkin (11/10/15, 11/30/15). The patient does experience chronic, and is not amenable to debridement. 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 She is not currently on any antibiotic therapy and has not been treated with any recently. 07/17/2016 -- was seen by Dr. Iran Planas of plastic surgery who saw her 2 weeks postop application of Theraskin #3. She had removed her dressing  and asked her to apply silver alginate on alternate days and follow-up back with the wound center. Future debridements and application of skin substitute would have to be done in the hospital due to her high risk for anesthesia. READMISSION 04/17/2021 Patient is now a 51 year old woman that we have had in this clinic for a prolonged period of time and 2016-2017 and then again for 2 visits in February 2018. At that point she had wounds on the right lower leg predominantly medial. She had also been seen by plastic surgery Dr. Leland Johns who I believe took her to the OR for operative debridement and application of TheraSkin in 2017. After she left our clinic she was  followed for a very prolonged period of time in the wound care center in [facility] who then referred [facility] where she was seen by Dr. Vernona Rieger. Again taken her to the OR for skin grafting which apparently did not take. She had multiple other attempts at dressings although I have not really looked over all of these notes in great detail. She has not been seen in a wound care center in about a year. She states over the last year in addition to her right lower leg she has developed wounds on the left lower leg quite [place], Legrand Rams ([id number]) [id number] Page 10 of 16 extensive. She is using Xeroform to all of these wounds without really any improvement. She also has Medicaid which does not cover wound products. The patient has had vascular work-ups in the past including most recently on 03/28/2021 showing biphasic waveforms on the right triphasic at the PTA and biphasic at the dorsalis pedis on the left. She was unable to tolerate any degree of compression to do ABIs. Unfortunately TBI's were also not done. She had venous reflux studies done in 2017. This did not show any evidence of a DVT or SVT and no venous incompetence was noted in the right leg at the time this was the only side with the wound As noted I did not look all over her old records. She apparently had a course of HBO and [facility] although I am not sure what the indication would have been. In any case she developed seizures and terminated treatment earlier. She is generally much more disabled than when we last saw her in clinic. She can no longer walk pretty much wheelchair-bound because predominantly of pain in the left hip. 04/24/2021; the patient tolerated the wraps we put on. We used Santyl and Hydrofera Blue under compression. I brought her back for a nurse visit for a change in dressing. With Medicaid we will have a hard time getting anything paid for and hence the need for compression. She  arrives in clinic with all the wounds looking somewhat better in terms of surface 12/20; circumferential wound on the right from the lateral to the medial. She has open areas on the left medial and left lateral x2 on all of this with the same surface. This does not look completely healthy although she does have some epithelialization. She is not complaining of a lot of pain which is unusual for her sickle ulcers. I have not looked over her extensive records from [facility]. She had recent arterial studies and has a history of venous reflux studies I will need to look these over although I do not believe she has significant arterial disease 2023 05/22/2021; patient's wound areas measure slightly smaller. Still a lot of drainage coming from the right we have been using  Hydrofera Blue and Santyl with some improvement in the wound surfaces. She tells me she will be getting transfused later in the week for her underlying sickle cell anemia I have looked over her recent arterial studies which were done in the fall. This was in November and showed biphasic and triphasic waveforms but she could not tolerate ABIs because of pressure and unfortunately TBI's were not done. She has not had recent venous reflux studies that I can see 1/10; not much change about the same surface area. This has a yellowish surface to it very gritty. We have been using Santyl and Hydrofera Blue for a prolonged period. Culture I did last week showed methicillin sensitive staph aureus "rare". Our intake nurse reports greenish drainage which may be the Hydrofera Blue itself 1/17; wounds are continue to measure smaller although I am not sure about the accuracy here. Especially the areas on the right are covered in what looks to be a nonviable surface although she does have some epithelialization. Similarly she has areas on the left medial and left lateral ankle area which appear to have a better surface and perhaps are slightly smaller. We  have been using Santyl and Hydrofera Blue. She cannot tolerate mechanical debridements She went for her reflux studies which showed significant reflux at the greater saphenous vein at the saphenofemoral junction as well as the greater saphenous vein in the proximal calf on the left she had reflux in the thigh and the common femoral vein and supra vein Fishel vein reflux in the greater saphenous vein. I will have vein and vascular look at this. My thoughts have been that these are likely sickle wounds. I looked through her old records from [facility] wound care center and then when she graduated to [facility] wound care center where she saw Dr. Zigmund Daniel and Dr. Vernona Rieger. Although I can see she had reflux studies done I do not see that she actually saw a vein and vascular. I went over the fact that she had operative debridements and actual skin grafting that did not take. I do not think these wounds have ever really progressed towards healing 1/31;Substantial wounds on the right ankle area. Hyper granulated very gritty adherent debris on the surface. 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 On the left she has no DVT or SVT deep vein reflux in the common femoral vein. Superficial vein reflux in the greater saphenous vein on although the vein diameter is not really all that large. I do not think there is anything that can be done with these although I am going to send her for consultation to vein and vascular. 2/7; Wound exam; substantial wound area on the right posterior ankle area and areas on the left medial ankle and left lateral ankle. I  was able to debride the left medial ankle last week fairly aggressively and it is back this week to a completely nonviable surface She will see vascular surgery this Friday and I would like them to review the venous studies and also any comments on her arterial status. If they do not see an issue here I am  going to refer her to plastic surgery for an operative debridement perhaps intraoperative ACell or Integra. Eventually she will require a deep tissue culture again 2/14; substantial wound area on the right posterior ankle, medial ankle. We have been using silver alginate The patient was seen by vein and vascular she had both venous reflux studies and arterial studies. 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 She had no evidence of superficial venous thrombosis. She did have superficial vein reflux at the saphenofemoral junction and the greater saphenous vein. On the left no evidence of a DVT no evidence of superficial venous throat thrombosis she did have deep vein reflux in the common femoral vein and superficial vein reflux in the greater saphenous vein but these were not felt to be amenable to ablation. In terms of arterial studies she had triphasic and wife biphasic wave waveforms bilaterally not felt to have a significant arterial issue. I do not get the feeling that they felt that any part of her nonhealing wounds were related to either arterial or venous issues. They did note that she had venous reflux at the right at the [facility] and G[facility]. And also on the left there were reflux in the deep system at the common femoral vein and greater saphenous vein in the proximal thigh. Nothing amenable to ablation. 2/20; she is making some decent progress on the right where there is nice skin between the 2 open areas on the right ankle. The surfaces here do not look viable yet there is some surrounding epithelialization. She still has  a small area on the left medial ankle area. Hyper-granulated Jody's away always 2/28 patient has an appointment with plastic surgery on 3/8. We will see her back on 3/9. She may have to call us to get the area redressed. We've been using Santyl under silver alginate. We made a nice improvement on the left medial ankle. The larger wounds on the right also looks somewhat better in terms of epithelialization although I think they could benefit from an aggressive debridement if plastic surgery would be willing to do that. Perhaps placement of Integra or a cell 07/26/2021: She saw Dr. Claudia Desanctis yesterday. He raised the question as to whether or not this might be pyoderma and wanted to wait until that question was answered by dermatology before proceeding with any sort of operative debridement. We have continue to use Santyl under silver alginate with Kerlix and Coban wraps. Overall, her wounds appear to be continuing to contract and epithelialize, with some granulation tissue present. There continues to be some slough on all wound surfaces. 08/09/2021: She has not been able to get an appointment with dermatology because apparently the offices in [place] do not accept Medicaid. She is looking into whether or not she can be seen at the main [facility] dermatology clinic. This is necessary because plastic surgery is concerned that her wounds might represent pyoderma and they did not want to do any procedure until that was clarified. We have been using Santyl under silver alginate with Kerlix and Coban wraps. Today, there was a greater amount of drainage on her dressings with a slight green discoloration and significant odor. Despite this, her wounds continue to contract and epithelialize. There is pale granulation tissue present and actually, on the left medial ankle, the granulation tissue is a bit hypertrophic. SAUL, DORSI ([id number]) [id number] Page 11 of  16 08/16/2021:  Last week, I took a culture and this grew back rare methicillin-resistant Staph aureus and rare corynebacterium. The MRSA was sensitive to gentamicin which we began applying topically on an empiric basis. This week, her wounds are a bit smaller and the drainage and odor are less. Her primary care provider is working on assisting the patient with a dermatology evaluation. She has been in silver alginate over the gentamicin that was started last week along with Kerlix and Coban wraps. 08/23/2021: Because she has Medicaid, we have been unable to get her into see any dermatologist in the [place] to rule out pyoderma gangrenosum, which was a requirement from plastic surgery prior to any sort of debridement and grafting. Despite this, however, all of her wounds continue to get smaller. The wound on her left medial ankle is nearly closed. There is no odor from the wounds, although she still accumulates a modest amount of drainage on her dressings. 08/30/2021: The lateral right ankle wound and the medial left ankle wound are a bit smaller today. The medial right ankle wound is about the same size. They are less tender. We have still been unable to get her into dermatology. 09/06/2021: All of the wounds are about the same size today. She continues to endorse minimal pain. I communicated with Dr. Claudia Desanctis in plastic surgery regarding our issues getting a dermatology appointment; he was out of town but indicated that he would look into perhaps performing the biopsy in his office and will have his office contact her. 09/14/2021: The patient has an appointment in dermatology, but it is not until October. Her wounds are roughly the same; she continues to have very thick purulent-looking drainage on her dressings. 09/20/2021: The left medial wound is nearly closed and just has a bit of accumulated eschar on the surface. The right medial and lateral ankle wounds are perhaps a little bit smaller. They continue to  have a very pale surface with accumulation of thin slough. 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 I did not think [employer] was indicated based on this. She is getting topical mupirocin with Prisma silver collagen. 10/04/2021: The patient was not seen in clinic last week due to childcare coverage issues. In the interim, the left medial leg wound has closed. The right sided leg wounds are smaller. There is more granulation tissue coming through, particularly on the lateral wound. The surface remains somewhat gritty. We have been applying topical mupirocin and Prisma silver collagen. 10/11/2021: The left medial leg wound remains closed. She does complain of some anesthetic sensation to the area. Both of the right-sided leg wounds are smaller but still have accumulated slough. 10/18/2021: Both right-sided leg wounds are minimally smaller this week. She still continues to accumulate slough and has thick drainage on her dressings. 10/23/2021: Both wounds continue to contract. There is still slough buildup. She has been approved for a keratin-based skin substitute trial product but it will not be available until next week. 10/30/2021: The wounds are about the same to perhaps slightly smaller. There is still continued slough buildup. Unfortunately, the rep for the keratin based product did not show up today and did not answer his phone when called. 11/08/2021: The wounds are little bit smaller today. She continues to have thick drainage but the surfaces are relatively clean with just a little bit of slough accumulation. She reported to me today that she is unable to completely flex her left ankle and on examination it seems this is potentially  related to scar tissue from her wounds. We do have the ProgenaMatrix trial product available for her today. 11/15/2021: Both wounds are smaller today. There is some slough accumulation on the surfaces, but the medial wound, in particular looks like  it is filling in and is less deep. She did hear from physical therapy and she is going to start working with them on July 11. She is here for her second application of the trial skin substitute, ProgenaMatrix. 11/22/2021: Both wounds continue to contract, the medial more dramatically than the lateral. Both wounds have a layer of slough on the surface, but underneath this, the gritty fibrous tissue has a little bit more of a pink cast to it rather than being as pale as it has been. 11/29/2021: The wounds are roughly the same size this week, perhaps a millimeter or 2 smaller. The medial wound has filled in and is nearly flush with the surrounding skin surface. She continues to have a lot of slough accumulation on both surfaces. 12/06/2021: No significant change to her wounds, but she has a new opening on her dorsal foot, just distal to the right lateral ankle wound. The area on her left medial ankle that reopened looks a little bit larger today. She has quite a bit of pain associated with the new wound. 12/12/2021: Her wounds look about the same but the new opening on her right lateral dorsal foot is a little bit bigger. She continues to have a fair amount of pain with this wound. 12/26/2021: The left medial ankle wound is tiny and superficial. She has 2 areas of crusting on her left lateral ankle, however, that appear to be threatening to open again. Her right medial ankle wound is a little bit smaller today but still continues to accumulate thick rubbery slough. The new dorsal foot wound is exquisitely painful but there is no odor or purulent drainage. No erythema or induration. The right lateral ankle wound looks about the same today, again with thick rubbery slough. 01/03/2022: The left medial ankle wound has closed again. Both right ankle wounds appear to be about the same size with thick rubbery slough. The dorsal foot wound on the right continues to be quite painful and she stated that she did not want  any debridement of that site today. 01/10/2022: No real change to any of her wounds. She continues to accumulate thick slough. The dorsal foot wound has merged with the lateral malleolar wound. She is experiencing significant pain in the dorsal foot portion of the ulcer. 01/16/2022: Absolutely no change or progress in her wounds. 01/24/2022: Her wounds are unchanged. She continues to build up slough and the wounds on her dorsal right foot are still exquisitely tender. 01/31/2022: The wounds actually measure a little bit narrower today. They still have thick slough on the surface but the underlying tissue seems a little less fibrotic. We changed to Iodosorb last week. 02/07/2022: Wounds continue to slowly epithelialize around the parameters. She has less pain today. She still accumulates a fairly substantial layer of slough. 02/15/2022: No change to the wounds overall. The measured a little bit larger today per the intake nurse. She continues to have substantial slough accumulation and her pain is a little bit worse. 02/21/2022. No change at all to her wounds. I do not see that plastic surgery has received her referral yet. 02/28/2022: The wounds remain unchanged. They are dry and fibrotic with accumulation of slough and eschar. She did receive an appointment to see plastic surgery on  October 23, but the office called her back and indicated they needed to reschedule and that the next available appointment was not until December. The patient became angry and decided she did not want to see this plastics group. Patient History Information obtained from Patient. Family History MARKEESHA, CHAR ([id number]) [id number] Page 12 of 16 Diabetes - Mother, Lung Disease - Mother, No family history of Cancer, Heart Disease, Hereditary Spherocytosis, Hypertension, Kidney Disease, Seizures, Stroke, Thyroid Problems, Tuberculosis. Social History Never smoker, Marital Status - Married, Alcohol  Use - Never, Drug Use - No History, Caffeine Use - Daily. Medical History Eyes Denies history of Cataracts, Glaucoma, Optic Neuritis Ear/Nose/Mouth/Throat Denies history of Chronic sinus problems/congestion, Middle ear problems Hematologic/Lymphatic Patient has history of Anemia, Sickle Cell Disease Denies history of Hemophilia, Human Immunodeficiency Virus, Lymphedema Respiratory Denies history of Aspiration, Asthma, Chronic Obstructive Pulmonary Disease (COPD), Pneumothorax, Sleep Apnea, Tuberculosis Cardiovascular Denies history of Angina, Arrhythmia, Congestive Heart Failure, Coronary Artery Disease, Deep Vein Thrombosis, Hypertension, Hypotension, Myocardial Infarction, Peripheral Arterial Disease, Peripheral Venous Disease, Phlebitis, Vasculitis Gastrointestinal Denies history of Cirrhosis , Colitis, Crohnoos, Hepatitis A, Hepatitis B, Hepatitis C Endocrine Denies history of Type I Diabetes, Type II Diabetes Genitourinary Denies history of End Stage Renal Disease Immunological Denies history of Lupus Erythematosus, Raynaudoos, Scleroderma Integumentary (Skin) Denies history of History of Burn Musculoskeletal Denies history of Gout, Rheumatoid Arthritis, Osteoarthritis, Osteomyelitis Neurologic Patient has history of Neuropathy - right foot intermittant Denies history of Dementia, Quadriplegia, Paraplegia, Seizure Disorder Oncologic Denies history of Received Chemotherapy, Received Radiation Psychiatric Denies history of Anorexia/bulimia, Confinement Anxiety Hospitalization/Surgery History - c section x2. - left breast lumpectomy. - iandD right ankle with theraskin. Medical A Surgical History Notes nd Constitutional Symptoms (General Health) H/O miscarriage Cardiovascular bradycardia Gastrointestinal cholilithiasis Objective Constitutional No acute distress.. Vitals Time Taken: 10:23 AM, Height: 67 in, Temperature: 98.4 F, Pulse: 97 bpm, Respiratory Rate: 18  breaths/min, Blood Pressure: 130/65 mmHg. Respiratory Normal work of breathing on room air.. General Notes: 02/28/2022: The wounds remain unchanged. They are dry and fibrotic with accumulation of slough and eschar. Integumentary (Hair, Skin) Wound #17 status is Open. Original cause of wound was Gradually Appeared. The date acquired was: 10/05/2012. The wound has been in treatment 45 weeks. The wound is located on the Right,Lateral Lower Leg. The wound measures 6.6cm length x 6.9cm width x 0.1cm depth; 35.767cm^2 area and 3.577cm^3 volume. There is Fat Layer (Subcutaneous Tissue) exposed. There is no tunneling or undermining noted. There is a large amount of serosanguineous drainage noted. The wound margin is distinct with the outline attached to the wound base. There is small (1-33%) pink, pale granulation within the wound bed. There is a large (67-100%) amount of necrotic tissue within the wound bed including Adherent Slough. The periwound skin appearance had no abnormalities noted for texture. The periwound skin appearance had no abnormalities noted for moisture. The periwound skin appearance had no abnormalities noted for color. Periwound temperature was noted as No Abnormality. Wound #21 status is Open. Original cause of wound was Gradually Appeared. The date acquired was: 06/26/2021. The wound has been in treatment 35 weeks. The wound is located on the Right,Medial Ankle. The wound measures 7.4cm length x 3cm width x 0.1cm depth; 17.436cm^2 area and 1.744cm^3 volume. There is Fat Layer (Subcutaneous Tissue) exposed. There is no tunneling or undermining noted. There is a medium amount of serosanguineous drainage noted. The wound margin is distinct with the outline attached to the wound base. There is small (1-33%) pink granulation  within the wound bed. There is a large (67-100%) amount of necrotic tissue within the wound bed including Adherent Slough. The periwound skin appearance had no  abnormalities noted for texture. The periwound skin appearance had no abnormalities noted for moisture. The periwound skin appearance had no abnormalities noted for color. Periwound temperature was noted as No Abnormality. 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 There was a Selective/Open Wound Non-Viable Tissue Debridement with a total area of 45.54 sq cm performed by Fredirick Maudlin, MD. With the following instrument(s): Curette to remove Non-Viable tissue/material. Material removed includes Eschar and Slough and after achieving pain control using Lidocaine 5% topical ointment. No specimens were taken. A time out was conducted at 10:39, prior to the start of the procedure. A Minimum amount of bleeding was controlled with Pressure. The procedure was tolerated well. Post Debridement Measurements: 6.6cm length x 6.9cm width x 0.1cm depth; 3.577cm^3 volume. Character of Wound/Ulcer Post Debridement is improved. Post procedure Diagnosis Wound #17: Same as Pre-Procedure General Notes: scribed for Dr. Celine Ahr by Adline Peals, RN. Wound #21 Pre-procedure diagnosis of Wound #21 is a Sickle Cell Lesion located on the Right,Medial Ankle .Severity of Tissue Pre Debridement is: Fat layer exposed. There was a Selective/Open Wound Non-Viable Tissue Debridement with a total area of 12 sq cm performed by Fredirick Maudlin, MD. With the following instrument(s): Curette to remove Non-Viable tissue/material. Material removed includes Eschar and Slough and after achieving pain control using Lidocaine 5% topical ointment. No specimens were taken. A time out was conducted at 10:39, prior to  the start of the procedure. A Minimum amount of bleeding was controlled with Pressure. The procedure was tolerated well. Post Debridement Measurements: 7.4cm length x 3cm width x 0.1cm depth; 1.744cm^3 volume. Character of Wound/Ulcer Post Debridement is improved. Severity of Tissue Post Debridement is: Fat layer exposed. Post procedure Diagnosis Wound #21: Same as Pre-Procedure General Notes: scribed for Dr. Celine Ahr by Adline Peals, RN. Plan Follow-up Appointments: Return Appointment in 1 week. - Dr. Celine Ahr Room 3 Anesthetic: (In clinic) Topical Lidocaine 5% applied to wound bed Bathing/ Shower/ Hygiene: May shower with protection but do not get wound dressing(s) wet. - Can get cast protector bags at [facility] or [facility] Edema Control - Lymphedema / SCD / Other: Elevate legs to the level of the heart or above for 30 minutes daily and/or when sitting, a frequency of: - throughout the day Avoid standing for long periods of time. Exercise regularly Additional Orders / Instructions: Follow Nutritious Diet Consults ordered were: [facility] Plastic Surgery - referral for surgical debridement for nonhealing sickle cell wounds to the right lower leg - biopsy in the past has showed these are not pyoderma - has seen Dr. Vernona Rieger in the past ICD10: L97.818 D57.1 The following medication(s) was prescribed: lidocaine topical 5 % ointment ointment topical was prescribed at facility WOUND #17: - Lower Leg Wound Laterality: Right, Lateral Cleanser: Soap and Water Every Other Day/30 Days Discharge Instructions: May shower and wash wound with dial antibacterial soap and water prior to dressing change. Cleanser: Wound Cleanser Every Other Day/30 Days Discharge Instructions: Cleanse the wound with wound cleanser prior to applying a clean dressing using gauze sponges, not tissue or cotton balls. Peri-Wound Care: Triamcinolone 15 (g) Every  Other Day/30 Days Discharge Instructions: Use triamcinolone 15 (g) as  directed Peri-Wound Care: Sween Lotion (Moisturizing lotion) Every Other Day/30 Days Discharge Instructions: Apply moisturizing lotion as directed Prim Dressing: MediHoney Gel, tube 1.5 (oz) Every Other Day/30 Days ary Discharge Instructions: Apply to wound bed as instructed Secondary Dressing: ABD Pad, 5x9 Every Other Day/30 Days Discharge Instructions: Apply over primary dressing as directed. Secondary Dressing: Zetuvit Plus 4x8 in Every Other Day/30 Days Discharge Instructions: Apply over primary dressing as directed. Secured With: Elastic Bandage 4 inch (ACE bandage) Every Other Day/30 Days Discharge Instructions: Secure with ACE bandage as directed. Com pression Wrap: Kerlix Roll 4.5x3.1 (in/yd) Every Other Day/30 Days Discharge Instructions: Apply Kerlix and Coban compression as directed. WOUND #21: - Ankle Wound Laterality: Right, Medial Cleanser: Soap and Water Every Other Day/30 Days Discharge Instructions: May shower and wash wound with dial antibacterial soap and water prior to dressing change. Cleanser: Wound Cleanser Every Other Day/30 Days Discharge Instructions: Cleanse the wound with wound cleanser prior to applying a clean dressing using gauze sponges, not tissue or cotton balls. Peri-Wound Care: Triamcinolone 15 (g) Every Other Day/30 Days Discharge Instructions: Use triamcinolone 15 (g) as directed Peri-Wound Care: Sween Lotion (Moisturizing lotion) Every Other Day/30 Days Discharge Instructions: Apply moisturizing lotion as directed Prim Dressing: MediHoney Gel, tube 1.5 (oz) Every Other Day/30 Days Jacelyn Pi ([id number]) [id number] Page 14 of 16 Discharge Instructions: Apply to wound bed as instructed Secondary Dressing: ABD Pad, 5x9 Every Other Day/30 Days Discharge Instructions: Apply over primary dressing as directed. Secondary Dressing: Zetuvit Plus 4x8 in Every Other Day/30 Days Discharge Instructions: Apply over primary  dressing as directed. Secured With: Elastic Bandage 4 inch (ACE bandage) Every Other Day/30 Days Discharge Instructions: Secure with ACE bandage as directed. Compression Wrap: Kerlix Roll 4.5x3.1 (in/yd) Every Other Day/30 Days Discharge Instructions: Apply Kerlix and Coban compression as directed. 02/28/2022: The wounds remain unchanged. They are dry and fibrotic with accumulation of slough and eschar. I used a curette to debride slough and eschar to the extent permitted by the patient. This was about 80% of the medial wound and 90% of the lateral wound. I discussed with her the reasoning behind why a rescheduled visit would not be until December and she insisted that that was not reasonable, that it should be within a week of the original visit. We offered her referral to a different plastics group. She has seen Dr. Vernona Rieger at [facility] in the past and so we will refer back to that group. I am going to change her dressing to Medihoney as we are making no progress at all with Iodoflex. Follow-up in 1 week. Electronic Signature(s) Signed: 02/28/2022 3:03:51 PM By: Fredirick Maudlin MD FACS Signed: 02/28/2022 4:09:44 PM By: Adline Peals Previous Signature: 02/28/2022 10:58:20 AM Version By: Fredirick Maudlin MD FACS Entered By: Adline Peals on 02/28/2022 14:09:49 -------------------------------------------------------------------------------- HxROS Details Patient Name: Date of Service: [name] [name], [name] 02/28/2022 10:00 A M Medical Record Number: [id number] Patient Account Number: [id number] Date of Birth/Sex: Treating RN: [birth year] (51 y.o. F) Primary Care Provider: [name] Other Clinician: Referring Provider: Treating Provider/Extender: Elyse Jarvis Weeks in Treatment: 29 Information Obtained From Patient Constitutional Symptoms (General Health) Medical History: Past Medical History Notes: H/O miscarriage Eyes Medical History: Negative for:  Cataracts; Glaucoma; Optic Neuritis Ear/Nose/Mouth/Throat Medical History: Negative for: Chronic sinus problems/congestion; Middle ear problems Hematologic/Lymphatic Medical History: Positive for: Anemia; Sickle Cell Disease Negative for: Hemophilia; Human Immunodeficiency Virus; Lymphedema Respiratory Medical  History: Negative for: Aspiration; Asthma; Chronic Obstructive Pulmonary Disease (COPD); Pneumothorax; Sleep Apnea; Tuberculosis Cardiovascular Medical History: ENIYA, CANNADY ([id number]) [id number] Page 15 of 16 Negative for: Angina; Arrhythmia; Congestive Heart Failure; Coronary Artery Disease; Deep Vein Thrombosis; Hypertension; Hypotension; Myocardial Infarction; Peripheral Arterial Disease; Peripheral Venous Disease; Phlebitis; Vasculitis Past Medical History Notes: bradycardia Gastrointestinal Medical History: Negative for: Cirrhosis ; Colitis; Crohns; Hepatitis A; Hepatitis B; Hepatitis C Past Medical History Notes: cholilithiasis Endocrine Medical History: Negative for: Type I Diabetes; Type II Diabetes Genitourinary Medical History: Negative for: End Stage Renal Disease Immunological Medical History: Negative for: Lupus Erythematosus; Raynauds; Scleroderma Integumentary (Skin) Medical History: Negative for: History of Burn Musculoskeletal Medical History: Negative for: Gout; Rheumatoid Arthritis; Osteoarthritis; Osteomyelitis Neurologic Medical History: Positive for: Neuropathy - right foot intermittant Negative for: Dementia; Quadriplegia; Paraplegia; Seizure Disorder Oncologic Medical History: Negative for: Received Chemotherapy; Received Radiation Psychiatric Medical History: Negative for: Anorexia/bulimia; Confinement Anxiety Immunizations Pneumococcal Vaccine: Received Pneumococcal Vaccination: No Implantable Devices None Hospitalization / Surgery History Type of Hospitalization/Surgery c section x2 left breast  lumpectomy iandD right ankle with theraskin Family and Social History Cancer: No; Diabetes: Yes - Mother; Heart Disease: No; Hereditary Spherocytosis: No; Hypertension: No; Kidney Disease: No; Lung Disease: Yes - Mother; Seizures: No; Stroke: No; Thyroid Problems: No; Tuberculosis: No; Never smoker; Marital Status - Married; Alcohol Use: Never; Drug Use: No History; Caffeine Use: Daily; Financial Concerns: No; Food, Clothing or Shelter Needs: No; Support System Lacking: No; Transportation Concerns: No Electronic Signature(s) Signed: 02/28/2022 11:49:46 AM By: Fredirick Maudlin MD FACS Entered By: Fredirick Maudlin on 02/28/2022 10:55:50 [name] ([id number]) [id number] Page 16 of 16 -------------------------------------------------------------------------------- SuperBill Details Patient Name: Date of Service: [name] [name] 02/28/2022 Medical Record Number: [id number] Patient Account Number: [id number] Date of Birth/Sex: Treating RN: 08/05/1970 (51 y.o. F) Primary Care Provider: [name] Other Clinician: Referring Provider: Treating Provider/Extender: Elyse Jarvis Weeks in Treatment: 43 Diagnosis Coding ICD-10 Codes Code Description [phone] Non-pressure chronic ulcer of other part of right lower leg with other specified severity 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 Facility Procedures : CPT4 Code: [id number] Description: [phone] - DEBRIDE WOUND 1ST 20 SQ CM OR < ICD-10 Diagnosis Description L97.818 Non-pressure chronic ulcer of other part of right lower leg with other specified s Modifier: everity Quantity: 1 : CPT4 Code: [id number] Description: 56389 - DEBRIDE WOUND EA ADDL 20 SQ CM ICD-10 Diagnosis Description L97.818 Non-pressure chronic ulcer of other part of right lower leg with other specified s Modifier: everity Quantity: 1 Physician Procedures : CPT4 Code Description Modifier [id number] - WC PHYS LEVEL 3 -  EST PT 25 ICD-10 Diagnosis Description L97.818 Non-pressure chronic ulcer of other part of right lower leg with other specified severity 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 Quantity: 1 : [id number] - WC PHYS DEBR WO ANESTH 20 SQ CM ICD-10 Diagnosis Description L97.818 Non-pressure chronic ulcer of other part of right lower leg with other specified severity Quantity: 1 : [id number] 46803 - WC PHYS DEBR WO ANESTH EA ADD 20 CM ICD-10 Diagnosis Description L97.818 Non-pressure chronic ulcer of other part of right lower leg with other specified severity Quantity: 1 Electronic Signature(s) Signed: 02/28/2022 11:01:18 AM By: Fredirick Maudlin MD FACS Entered By: Fredirick Maudlin on 02/28/2022 11:01:18

## 2022-02-28 NOTE — Progress Notes (Signed)
SARAH, BAEZ ([id number]) [id number] Page 1 of 7 Visit Report for 02/28/2022 Arrival Information Details Patient Name: Date of Service: [name] [name] 02/28/2022 10:00 A M Medical Record Number: [id number] Patient Account Number: [id number] Date of Birth/Sex: Treating RN: April 06, 1971 (51 y.o. Donney Rankins, Lovena Le Primary Care Jakyri Brunkhorst: [name] Other Clinician: Referring Kuzey Ogata: Treating Azriel Dancy/Extender: Elyse Jarvis Weeks in Treatment: 73 Visit Information History Since Last Visit Added or deleted any medications: No Patient Arrived: [name] Any new allergies or adverse reactions: No Arrival Time: 10:22 Had a fall or experienced change in No Accompanied By: self activities of daily living that may affect Transfer Assistance: None risk of falls: Patient Identification Verified: Yes Signs or symptoms of abuse/neglect since last visito No Secondary Verification Process Completed: Yes Hospitalized since last visit: No Patient Requires Transmission-Based Precautions: No Implantable device outside of the clinic excluding No Patient Has Alerts: No cellular tissue based products placed in the center since last visit: Has Dressing in Place as Prescribed: Yes Has Compression in Place as Prescribed: Yes Pain Present Now: Yes Electronic Signature(s) Signed: 02/28/2022 4:09:44 PM By: Adline Peals Entered By: Adline Peals on 02/28/2022 10:23:00 -------------------------------------------------------------------------------- Lower Extremity Assessment Details Patient Name: Date of Service: [name], [place] [name] 02/28/2022 10:00 A M Medical Record Number: [id number] Patient Account Number: [id number] Date of Birth/Sex: Treating RN: 1970-06-18 (51 y.o. [name] Primary Care Nakeia Calvi: [name] Other Clinician: Referring Carman Essick: Treating Pavielle Biggar/Extender: Elyse Jarvis Weeks in  Treatment: 45 Edema Assessment Assessed: [Left: No] [Right: No] Edema: [Left: Ye] [Right: s] Calf Left: Right: Point of Measurement: 33 cm From Medial Instep 29.5 cm Ankle Left: Right: Point of Measurement: 10 cm From Medial Instep 20.6 cm Vascular Assessment Pulses: Dorsalis Pedis Palpable: [Right:Yes [id number] Page 2 of 7] Electronic Signature(s) Signed: 02/28/2022 4:09:44 PM By: Adline Peals Entered By: Adline Peals on 02/28/2022 10:30:01 -------------------------------------------------------------------------------- Multi Wound Chart Details Patient Name: Date of Service: [name], [name] 02/28/2022 10:00 A M Medical Record Number: [id number] Patient Account Number: [id number] Date of Birth/Sex: Treating RN: 18-Jun-1970 (51 y.o. F) Primary Care Jeanett Antonopoulos: [name] Other Clinician: Referring Ioana Louks: Treating Jamica Woodyard/Extender: Elyse Jarvis Weeks in Treatment: 75 Vital Signs Height(in): 67 Pulse(bpm): 97 Weight(lbs): Blood Pressure(mmHg): 130/65 Body Mass Index(BMI): Temperature(F): 98.4 Respiratory Rate(breaths/min): 18 [17:Photos:] [N/A:N/A] Right, Lateral Lower Leg Right, Medial Ankle N/A Wound Location: Gradually Appeared Gradually Appeared N/A Wounding Event: Sickle Cell Lesion Sickle Cell Lesion N/A Primary Etiology: N/A Venous Leg Ulcer N/A Secondary Etiology: Anemia, Sickle Cell Disease, Anemia, Sickle Cell Disease, N/A Comorbid History: Neuropathy Neuropathy 10/05/2012 06/26/2021 N/A Date Acquired: 16 35 N/A Weeks of Treatment: Open Open N/A Wound Status: No No N/A Wound Recurrence: Yes No N/A Clustered Wound: 6.6x6.9x0.1 7.4x3x0.1 N/A Measurements L x W x D (cm) 35.767 17.436 N/A A (cm) : rea 3.577 1.744 N/A Volume (cm) : 81.00% 39.80% N/A % Reduction in A rea: 81.00% 39.80% N/A % Reduction in Volume: Full Thickness Without Exposed Full Thickness Without Exposed  N/A Classification: Support Structures Support Structures Large Medium N/A Exudate A mount: Serosanguineous Serosanguineous N/A Exudate Type: red, brown red, brown N/A Exudate Color: Distinct, outline attached Distinct, outline attached N/A Wound Margin: Small (1-33%) Small (1-33%) N/A Granulation A mount: Pink, Pale Pink N/A Granulation Quality: Large (67-100%) Large (67-100%) N/A Necrotic A mount: Fat Layer (Subcutaneous Tissue): Yes Fat Layer (Subcutaneous Tissue): Yes N/A Exposed Structures: Fascia: No Fascia: No Tendon: No Tendon: No Muscle: No Muscle: No Joint:  No Joint: No Bone: No Bone: No Small (1-33%) Small (1-33%) N/A Epithelialization: Debridement - Selective/Open Wound Debridement - Selective/Open Wound N/A Debridement: Pre-procedure Verification/Time Out 10:39 10:39 N/A Taken: Lidocaine 5% topical ointment Lidocaine 5% topical ointment N/A Pain Control: Necrotic/Eschar, Slough Necrotic/Eschar, Slough N/A Tissue Debrided: Non-Viable Tissue Non-Viable Tissue N/A LevelEula Fried ([id number]) [id number] Page 3 of 7 45.54 12 N/A Debridement A (sq cm): rea Curette Curette N/A Instrument: Minimum Minimum N/A Bleeding: Pressure Pressure N/A Hemostasis A chieved: Procedure was tolerated well Procedure was tolerated well N/A Debridement Treatment Response: 6.6x6.9x0.1 7.4x3x0.1 N/A Post Debridement Measurements L x W x D (cm) 3.577 1.744 N/A Post Debridement Volume: (cm) No Abnormalities Noted No Abnormalities Noted N/A Periwound Skin Texture: No Abnormalities Noted No Abnormalities Noted N/A Periwound Skin Moisture: No Abnormalities Noted No Abnormalities Noted N/A Periwound Skin Color: No Abnormality No Abnormality N/A Temperature: N/A Debridement N/A Procedures Performed: Treatment Notes Electronic Signature(s) Signed: 02/28/2022 10:51:04 AM By: Fredirick Maudlin MD FACS Entered By: Fredirick Maudlin on  02/28/2022 10:51:04 -------------------------------------------------------------------------------- Multi-Disciplinary Care Plan Details Patient Name: Date of Service: [name], [name] 02/28/2022 10:00 A M Medical Record Number: [id number] Patient Account Number: [id number] Date of Birth/Sex: Treating RN: [birth year] (51 y.o. [name] Primary Care Rushie Brazel: [name] Other Clinician: Referring Ankith Edmonston: Treating Tamikka Pilger/Extender: Elyse Jarvis Weeks in Treatment: 80 Multidisciplinary Care Plan reviewed with physician Active Inactive Venous Leg Ulcer Nursing Diagnoses: Actual venous Insuffiency (use after diagnosis is confirmed) Knowledge deficit related to disease process and management Goals: Patient will maintain optimal edema control Date Initiated: 06/26/2021 Target Resolution Date: 05/19/2022 Goal Status: Active Interventions: Assess peripheral edema status every visit. Compression as ordered Treatment Activities: Therapeutic compression applied : 06/26/2021 Notes: Wound/Skin Impairment Nursing Diagnoses: Impaired tissue integrity Goals: Patient/caregiver will verbalize understanding of skin care regimen Date Initiated: 04/17/2021 Target Resolution Date: 05/19/2022 Goal Status: Active Ulcer/skin breakdown will have a volume reduction of 30% by week 4 Date Initiated: 04/17/2021 Date Inactivated: 05/29/2021 Target Resolution Date: 05/15/2021 Goal Status: Met Ulcer/skin breakdown will have a volume reduction of 50% by week 8 Date Initiated: 05/29/2021 Date Inactivated: 06/26/2021 Target Resolution Date: 06/26/2021 MARLETA, LAPIERRE ([id number]) [id number] Page 4 of 7 Goal Status: Unmet Unmet Reason: venous reflux Interventions: Assess patient/caregiver ability to obtain necessary supplies Assess patient/caregiver ability to perform ulcer/skin care regimen upon admission and as needed Assess ulceration(s) every  visit Provide education on ulcer and skin care Treatment Activities: Topical wound management initiated : 04/17/2021 Notes: 06/08/21: Left leg wounds greater than 30% volume reduction, right leg acute infection. Electronic Signature(s) Signed: 02/28/2022 4:09:44 PM By: Adline Peals Entered By: Adline Peals on 02/28/2022 10:37:26 -------------------------------------------------------------------------------- Pain Assessment Details Patient Name: Date of Service: [name], [place] [name] 02/28/2022 10:00 A M Medical Record Number: [id number] Patient Account Number: [id number] Date of Birth/Sex: Treating RN: 1970-11-07 (51 y.o. [name] Primary Care Maeva Dant: [name] Other Clinician: Referring Lakenzie Mcclafferty: Treating Charman Blasco/Extender: Elyse Jarvis Weeks in Treatment: 45 Active Problems Location of Pain Severity and Description of Pain Patient Has Paino Yes Site Locations Pain Location: Pain in Ulcers Duration of the Pain. Constant / Intermittento Intermittent Rate the pain. Current Pain Level: 3 Pain Management and Medication Current Pain Management: Electronic Signature(s) Signed: 02/28/2022 4:09:44 PM By: Adline Peals Entered By: Adline Peals on 02/28/2022 10:23:29 [name] ([id number]) [id number] Page 5 of 7 -------------------------------------------------------------------------------- Patient/Caregiver Education Details Patient Name: Date of Service: [name], [name] 10/12/2023andnbsp10:00 A M Medical Record Number:  [id number] Patient Account Number: [id number] Date of Birth/Gender: Treating RN: [birth year] (51 y.o. [name] Primary Care Physician: [name] Other Clinician: Referring Physician: Treating Physician/Extender: Elyse Jarvis Weeks in Treatment: 52 Education Assessment Education Provided To: Patient Education Topics  Provided Wound/Skin Impairment: Methods: Explain/Verbal Responses: Reinforcements needed, State content correctly Electronic Signature(s) Signed: 02/28/2022 4:09:44 PM By: Adline Peals Entered By: Adline Peals on 02/28/2022 10:37:39 -------------------------------------------------------------------------------- Wound Assessment Details Patient Name: Date of Service: [name], [name] 02/28/2022 10:00 A M Medical Record Number: [id number] Patient Account Number: [id number] Date of Birth/Sex: Treating RN: October 21, 1970 (51 y.o. [name] Primary Care Milly Goggins: [name] Other Clinician: Referring Willy Vorce: Treating Delcie Ruppert/Extender: Elyse Jarvis Weeks in Treatment: 45 Wound Status Wound Number: 17 Primary Etiology: Sickle Cell Lesion Wound Location: Right, Lateral Lower Leg Wound Status: Open Wounding Event: Gradually Appeared Comorbid History: Anemia, Sickle Cell Disease, Neuropathy Date Acquired: 10/05/2012 Weeks Of Treatment: 45 Clustered Wound: Yes Photos Wound Measurements Length: (cm) 6.6 Width: (cm) 6.9 Depth: (cm) 0.1 Area: (cm) 35.767 Volume: (cm) 3.577 % Reduction in Area: 81% % Reduction in Volume: 81% Epithelialization: Small (1-33%) Tunneling: No Undermining: No Wound Description CARNELLA, FRYMAN ([id number]) Classification: Full Thickness Without Exposed Support Structures Wound Margin: Distinct, outline attached Exudate Amount: Large Exudate Type: Serosanguineous Exudate Color: red, brown [phone].pdf Page 6 of 7 Foul Odor After Cleansing: No Slough/Fibrino Yes Wound Bed Granulation Amount: Small (1-33%) Exposed Structure Granulation Quality: Pink, Pale Fascia Exposed: No Necrotic Amount: Large (67-100%) Fat Layer (Subcutaneous Tissue) Exposed: Yes Necrotic Quality: Adherent Slough Tendon Exposed: No Muscle Exposed: No Joint Exposed: No Bone Exposed: No Periwound Skin  Texture Texture Color No Abnormalities Noted: Yes No Abnormalities Noted: Yes Moisture Temperature / Pain No Abnormalities Noted: Yes Temperature: No Abnormality Electronic Signature(s) Signed: 02/28/2022 4:09:44 PM By: Adline Peals Entered By: Adline Peals on 02/28/2022 10:32:25 -------------------------------------------------------------------------------- Wound Assessment Details Patient Name: Date of Service: [name], [name] 02/28/2022 10:00 A M Medical Record Number: [id number] Patient Account Number: [id number] Date of Birth/Sex: Treating RN: [birth year] (51 y.o. [name] Primary Care Katena Petitjean: [name] Other Clinician: Referring Jaxin Fulfer: Treating Larrie Lucia/Extender: Elyse Jarvis Weeks in Treatment: 45 Wound Status Wound Number: 21 Primary Etiology: Sickle Cell Lesion Wound Location: Right, Medial Ankle Secondary Etiology: Venous Leg Ulcer Wounding Event: Gradually Appeared Wound Status: Open Date Acquired: 06/26/2021 Comorbid History: Anemia, Sickle Cell Disease, Neuropathy Weeks Of Treatment: 35 Clustered Wound: No Photos Wound Measurements Length: (cm) 7.4 Width: (cm) 3 Depth: (cm) 0.1 Area: (cm) 17.436 Volume: (cm) 1.744 % Reduction in Area: 39.8% % Reduction in Volume: 39.8% Epithelialization: Small (1-33%) Tunneling: No Undermining: No Wound Description Classification: Full Thickness Without Exposed Support Structures Alpine, Legrand Rams ([id number]) Wound Margin: Distinct, outline attached Exudate Amount: Medium Exudate Type: Serosanguineous Exudate Color: red, brown Foul Odor After Cleansing: No [phone].pdf Page 7 of 7 Slough/Fibrino Yes Wound Bed Granulation Amount: Small (1-33%) Exposed Structure Granulation Quality: Pink Fascia Exposed: No Necrotic Amount: Large (67-100%) Fat Layer (Subcutaneous Tissue) Exposed: Yes Necrotic Quality: Adherent Slough Tendon Exposed:  No Muscle Exposed: No Joint Exposed: No Bone Exposed: No Periwound Skin Texture Texture Color No Abnormalities Noted: Yes No Abnormalities Noted: Yes Moisture Temperature / Pain No Abnormalities Noted: Yes Temperature: No Abnormality Electronic Signature(s) Signed: 02/28/2022 4:09:44 PM By: Adline Peals Entered By: Adline Peals on 02/28/2022 10:32:48 -------------------------------------------------------------------------------- Vitals Details Patient Name: Date of Service: Henning, [name] [name] 02/28/2022 10:00 A M Medical Record Number: [id number] Patient  Account Number: [id number] Date of Birth/Sex: Treating RN: [birth year] (51 y.o. [name] Primary Care Hason Ofarrell: [name] Other Clinician: Referring Dreshawn Hendershott: Treating Gussie Murton/Extender: Elyse Jarvis Weeks in Treatment: 45 Vital Signs Time Taken: 10:23 Temperature (F): 98.4 Height (in): 67 Pulse (bpm): 97 Respiratory Rate (breaths/min): 18 Blood Pressure (mmHg): 130/65 Reference Range: 80 - 120 mg / dl Electronic Signature(s) Signed: 02/28/2022 4:09:44 PM By: Adline Peals Entered By: Adline Peals on 02/28/2022 10:23:21

## 2022-03-05 ENCOUNTER — Telehealth: Payer: Self-pay

## 2022-03-05 NOTE — Telephone Encounter (Signed)
Pt has upcoming appt and will need the note faxed over to [facility] at [phone] att. Abhay Singh. [place]

## 2022-03-07 ENCOUNTER — Encounter (HOSPITAL_BASED_OUTPATIENT_CLINIC_OR_DEPARTMENT_OTHER): Payer: Medicaid Other | Admitting: Internal Medicine

## 2022-03-07 DIAGNOSIS — L97818 Non-pressure chronic ulcer of other part of right lower leg with other specified severity: Secondary | ICD-10-CM | POA: Diagnosis not present

## 2022-03-07 NOTE — Progress Notes (Signed)
[name], [name] ([id number]) [id number] Page 1 of 11 Visit Report for 03/07/2022 HPI Details Patient Name: Date of Service: [name] [name] 03/07/2022 10:15 A M Medical Record Number: [id number] Patient Account Number: [id number] Date of Birth/Sex: Treating RN: [birth year] (51 y.o. F) Primary Care Provider: [name] Other Clinician: Referring Provider: Treating Provider/Extender: Bernette Redbird in Treatment: 3 History of Present Illness Location: medial and lateral ankle region on the right and left medial malleolus Quality: Patient reports experiencing a shooting pain to affected area(s). Severity: Patient states wound(s) are getting worse. Duration: right lower extremity bimalleolar ulcers have been present for approximately 2 years; the rright meedial malleolus ulcer has been there proximally 6 months Timing: Pain in wound is constant (hurts all the time) Context: The wound would happen gradually ssociated Signs and Symptoms: Patient reports having increase discharge. A HPI Description: 51 year old patient with a history of sickle cell anemia who was last seen by me with ulceration of the right lower extremity above the ankle and was referred to Dr. Leland Johns for a surgical debridement as I was unable to do anything in the office due to excruciating pain. At that stage she was referred from the plastic surgery service to dermatology who treated her for a skin infection with doxycycline and then Levaquin and a local antibiotic ointment. I understand the patient has since developed ulceration on the left ankle both medial and lateral and was now referred back to the wound center as dermatology has finished the management. I do not have any notes from the dermatology department Old notes: 51 year old patient with a history of sickle cell anemia, pain bilateral lower extremities, right lower extremity ulcer and has a history of  receiving a skin graft( Theraskin) several months ago. She has been visiting the wound center [facility] and was seen by Dr. Dellia Nims and Dr. Leland Johns. after prolonged conservator therapy between July 2016 and January 2017. She had been seen by the plastic surgeon and taken to the OR for debridement and application of Theraskin. She had 3 applications of Theraskin and was then treated with collagen. Prior to that she had a history of similar problems in 2014 and was treated conservatively. Had a reflux study done for the right lower extremity in August 2016 without reflux or DVT . Past medical history significant for sickle cell disease, anemia, leg ulcers, cholelithiasis,and has never been a smoker. Once the patient was discharged on the wound center she says within 2 or 3 weeks the problems recurred and she has been treating it conservatively. since I saw her 3 weeks ago at [facility] she has been unable to get her dressing material but has completed a course of doxycycline. 6/7/ 2017 -- lower extremity venous duplex reflux evaluation was done No evidence of SVT or DVT in the RLL. No venous incompetence in the RLL. No further vascular workup is indicated at this time. She was seen by Dr. [name], on 10/04/2015. She agreed with the plan of taking her to the OR for debridement and application of theraskin and would also take biopsies to rule out pyoderma gangrenosum. Follow-up note dated May 31 received and she was status post application of Theraskin to multiple ulcers around the right ankle. Pathology did not show evidence of malignancy or pyoderma gangrenosum. She would continue to see as in the wound clinic for further care and see Dr. Leland Johns as needed. The patient brought the biopsy report and it was consistent with stasis ulcer no evidence  of malignancy and the comment was that there was some adjacent neovascularization, fibrosis and patchy perivascular chronic  inflammation. 11/15/2015 -- today we applied her first application of Theraskin 11/30/15; TheraSkin #2 12/13/2015 -- she is having a lot of pain locally and is here for possible application of a theraskin today. 01/16/2016 -- the patient has significant pain and has noticed despite in spite of all local care and oral pain medication. It is impossible to debride her in the office. 02/06/2016 -- I do not see any notes from Dr. Iran Planas( the patient has not made a call to the office know as she heard from them) and the only visit to recently was with her PCP Dr. Danella Penton -- I saw her on 01/16/2016 and prescribed 90 tablets of oxycodone 10 mg and did lab work and screening for HIV. the HIV was negative and hemoglobin was 6.3 with a WBC count of 14.9 and hematocrit of 17.8 with platelets of 561. 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 The bimalleolar ulcers to the right lower extremity have been present for approximately 2 years, the left medial malleolus ulcer has been present approximately 6 months. She has followed with Dr.Thimmappa in the past and has had a total of 3 applications of Theraskin (01/2015, 09/2015, 06/17/16). She has also followed with Dr. Con Memos here in the clinic and has received 2 applications of TheraSkin (11/10/15, 11/30/15). The patient does experience chronic, and is not amenable to debridement. 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 She is not currently on any antibiotic therapy and has not been treated with any recently. 07/17/2016 -- was seen by Dr. Iran Planas of plastic surgery who saw her 2 weeks postop application of Theraskin #3. She had removed her dressing and asked her to apply silver alginate on alternate days and follow-up back with the wound center. Future debridements and application of skin substitute  would have to be done in the hospital due to her high risk for anesthesia. READMISSION 04/17/2021 Patient is now a 51 year old woman that we have had in this clinic for a prolonged period of time and 2016-2017 and then again for 2 visits in February 2018. At that point she had wounds on the right lower leg predominantly medial. She had also been seen by plastic surgery Dr. Leland Johns who I believe took her to the OR for operative debridement and application of TheraSkin in 2017. After she left our clinic she was followed for a very prolonged period of time in the wound care center in [facility] who then referred her ultimately to [facility] where she was seen by Dr. Vernona Rieger. Again taken her to the OR for skin grafting which apparently did not take. She had multiple other attempts at dressings although I have not really looked over all of these notes in great detail. She has not been seen in a wound care center in about a year. She states over the last year in addition to her right lower leg she has developed wounds on the left lower leg quite extensive. She is using Xeroform to all of these wounds without really any improvement. She also has Medicaid which does not cover wound products. The patient has had vascular work-ups in the past including most recently on 03/28/2021 showing biphasic waveforms on the right triphasic at the [facility] and [place], Legrand Rams ([id number]) [id number] Page 2  of 11 biphasic at the dorsalis pedis on the left. She was unable to tolerate any degree of compression to do ABIs. Unfortunately TBI's were also not done. She had venous reflux studies done in 2017. This did not show any evidence of a DVT or SVT and no venous incompetence was noted in the right leg at the time this was the only side with the wound As noted I did not look all over her old records. She apparently had a course of HBO and [facility] although I am not sure what the indication would have been.  In any case she developed seizures and terminated treatment earlier. She is generally much more disabled than when we last saw her in clinic. She can no longer walk pretty much wheelchair-bound because predominantly of pain in the left hip. 04/24/2021; the patient tolerated the wraps we put on. We used Santyl and Hydrofera Blue under compression. I brought her back for a nurse visit for a change in dressing. With Medicaid we will have a hard time getting anything paid for and hence the need for compression. She arrives in clinic with all the wounds looking somewhat better in terms of surface 12/20; circumferential wound on the right from the lateral to the medial. She has open areas on the left medial and left lateral x2 on all of this with the same surface. This does not look completely healthy although she does have some epithelialization. She is not complaining of a lot of pain which is unusual for her sickle ulcers. I have not looked over her extensive records from [facility]. She had recent arterial studies and has a history of venous reflux studies I will need to look these over although I do not believe she has significant arterial disease 2023 05/22/2021; patient's wound areas measure slightly smaller. Still a lot of drainage coming from the right we have been using Hydrofera Blue and Santyl with some improvement in the wound surfaces. She tells me she will be getting transfused later in the week for her underlying sickle cell anemia I have looked over her recent arterial studies which were done in the fall. This was in November and showed biphasic and triphasic waveforms but she could not tolerate ABIs because of pressure and unfortunately TBI's were not done. She has not had recent venous reflux studies that I can see 1/10; not much change about the same surface area. This has a yellowish surface to it very gritty. We have been using Santyl and Hydrofera Blue for a prolonged period. Culture I  did last week showed methicillin sensitive staph aureus "rare". Our intake nurse reports greenish drainage which may be the Hydrofera Blue itself 1/17; wounds are continue to measure smaller although I am not sure about the accuracy here. Especially the areas on the right are covered in what looks to be a nonviable surface although she does have some epithelialization. Similarly she has areas on the left medial and left lateral ankle area which appear to have a better surface and perhaps are slightly smaller. We have been using Santyl and Hydrofera Blue. She cannot tolerate mechanical debridements She went for her reflux studies which showed significant reflux at the greater saphenous vein at the saphenofemoral junction as well as the greater saphenous vein in the proximal calf on the left she had reflux in the thigh and the common femoral vein and supra vein Fishel vein reflux in the greater saphenous vein. I will have vein and vascular look at this. My  thoughts have been that these are likely sickle wounds. I looked through her old records from [facility] wound care center and then when she graduated to [facility] wound care center where she saw Dr. Zigmund Daniel and Dr. Vernona Rieger. Although I can see she had reflux studies done I do not see that she actually saw a vein and vascular. I went over the fact that she had operative debridements and actual skin grafting that did not take. I do not think these wounds have ever really progressed towards healing 1/31;Substantial wounds on the right ankle area. Hyper granulated very gritty adherent debris on the surface. 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 On the left she has no DVT or SVT deep vein reflux in the common femoral vein. Superficial vein reflux in the greater saphenous vein on although the vein diameter is not really all that large. I do not think there is anything that can be done with these although I am going to send her for consultation to vein and vascular. 2/7; Wound exam; substantial wound area on the right posterior ankle area and areas on the left medial ankle and left lateral ankle. I was able to debride the left medial ankle last week fairly aggressively and it is back this week to a completely nonviable surface She will see vascular surgery this Friday and I would like them to review the venous studies and also any comments on her arterial status. If they do not see an issue here I am going to refer her to plastic surgery for an operative debridement perhaps intraoperative ACell or Integra. Eventually she will require a deep tissue culture again 2/14; substantial wound area on the right posterior ankle, medial ankle. We have been using silver alginate The patient was seen by vein and vascular she had both venous reflux studies and arterial studies. 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 She had no evidence of superficial venous thrombosis. She did have superficial vein reflux at the saphenofemoral junction and the greater saphenous vein. On the left no evidence of a DVT no evidence of superficial venous throat thrombosis she did have deep vein reflux in the common femoral vein and superficial vein reflux in the greater saphenous vein but these were not felt to be amenable to ablation. In terms of arterial studies she had triphasic and wife biphasic wave waveforms bilaterally not felt to have a significant arterial issue. I do not get the feeling that they felt that any part of her nonhealing  wounds were related to either arterial or venous issues. They did note that she had venous reflux at the right at the [facility] and [facility]. And also on the left there were reflux in the deep system at the common femoral vein and greater saphenous vein in the proximal thigh. Nothing amenable to ablation. 2/20; she is making some decent progress on the right where there is nice skin between the 2 open areas on the right ankle. The surfaces here do not look viable yet there is  some surrounding epithelialization. She still has a small area on the left medial ankle area. Hyper-granulated [name] 2/28 patient has an appointment with plastic surgery on 3/8. We will see her back on 3/9. She may have to call us to get the area redressed. We've been using Santyl under silver alginate. We made a nice improvement on the left medial ankle. The larger wounds on the right also looks somewhat better in terms of epithelialization although I think they could benefit from an aggressive debridement if plastic surgery would be willing to do that. Perhaps placement of Integra or a cell 07/26/2021: She saw Dr. Claudia Desanctis yesterday. He raised the question as to whether or not this might be pyoderma and wanted to wait until that question was answered by dermatology before proceeding with any sort of operative debridement. We have continue to use Santyl under silver alginate with Kerlix and Coban wraps. Overall, her wounds appear to be continuing to contract and epithelialize, with some granulation tissue present. There continues to be some slough on all wound surfaces. 08/09/2021: She has not been able to get an appointment with dermatology because apparently the offices in [place] do not accept Medicaid. She is looking into whether or not she can be seen at the main [facility] dermatology clinic. This is necessary because plastic surgery is concerned that her wounds might represent pyoderma and they did  not want to do any procedure until that was clarified. We have been using Santyl under silver alginate with Kerlix and Coban wraps. Today, there was a greater amount of drainage on her dressings with a slight green discoloration and significant odor. Despite this, her wounds continue to contract and epithelialize. There is pale granulation tissue present and actually, on the left medial ankle, the granulation tissue is a bit hypertrophic. 08/16/2021: Last week, I took a culture and this grew back rare methicillin-resistant Staph aureus and rare corynebacterium. The MRSA was sensitive to gentamicin which we began applying topically on an empiric basis. This week, her wounds are a bit smaller and the drainage and odor are less. Her primary care provider is working on assisting the patient with a dermatology evaluation. She has been in silver alginate over the gentamicin that was started last week along [place], Legrand Rams ([id number]) [id number] Page 3 of 11 with Kerlix and Coban wraps. 08/23/2021: Because she has Medicaid, we have been unable to get her into see any dermatologist in the [place] to rule out pyoderma gangrenosum, which was a requirement from plastic surgery prior to any sort of debridement and grafting. Despite this, however, all of her wounds continue to get smaller. The wound on her left medial ankle is nearly closed. There is no odor from the wounds, although she still accumulates a modest amount of drainage on her dressings. 08/30/2021: The lateral right ankle wound and the medial left ankle wound are a bit smaller today. The medial right ankle wound is about the same size. They are less tender. We have still been unable to get her into dermatology. 09/06/2021: All of the wounds are about the same size today. She continues to endorse minimal pain. I communicated with Dr. Claudia Desanctis in plastic surgery regarding our issues getting a dermatology appointment; he was out of  town but indicated that he would look into perhaps performing the biopsy in his office and will have his office contact her. 09/14/2021: The patient has an appointment in dermatology, but it is not until October. Her wounds  are roughly the same; she continues to have very thick purulent-looking drainage on her dressings. 09/20/2021: The left medial wound is nearly closed and just has a bit of accumulated eschar on the surface. The right medial and lateral ankle wounds are perhaps a little bit smaller. They continue to have a very pale surface with accumulation of thin slough. 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 I did not think [employer] was indicated based on this. She is getting topical mupirocin with Prisma silver collagen. 10/04/2021: The patient was not seen in clinic last week due to childcare coverage issues. In the interim, the left medial leg wound has closed. The right sided leg wounds are smaller. There is more granulation tissue coming through, particularly on the lateral wound. The surface remains somewhat gritty. We have been applying topical mupirocin and Prisma silver collagen. 10/11/2021: The left medial leg wound remains closed. She does complain of some anesthetic sensation to the area. Both of the right-sided leg wounds are smaller but still have accumulated slough. 10/18/2021: Both right-sided leg wounds are minimally smaller this week. She still continues to accumulate slough and has thick drainage on her dressings. 10/23/2021: Both wounds continue to contract. There is still slough buildup. She has been approved for a keratin-based skin substitute trial product but it will not be available until next week. 10/30/2021: The wounds are about the same to perhaps slightly smaller. There is still continued slough buildup. Unfortunately, the rep for the keratin based product did not show up today and did not answer his phone when called. 11/08/2021: The wounds are  little bit smaller today. She continues to have thick drainage but the surfaces are relatively clean with just a little bit of slough accumulation. She reported to me today that she is unable to completely flex her left ankle and on examination it seems this is potentially related to scar tissue from her wounds. We do have the ProgenaMatrix trial product available for her today. 11/15/2021: Both wounds are smaller today. There is some slough accumulation on the surfaces, but the medial wound, in particular looks like it is filling in and is less deep. She did hear from physical therapy and she is going to start working with them on July 11. She is here for her second application of the trial skin substitute, ProgenaMatrix. 11/22/2021: Both wounds continue to contract, the medial more dramatically than the lateral. Both wounds have a layer of slough on the surface, but underneath this, the gritty fibrous tissue has a little bit more of a pink cast to it rather than being as pale as it has been. 11/29/2021: The wounds are roughly the same size this week, perhaps a millimeter or 2 smaller. The medial wound has filled in and is nearly flush with the surrounding skin surface. She continues to have a lot of slough accumulation on both surfaces. 12/06/2021: No significant change to her wounds, but she has a new opening on her dorsal foot, just distal to the right lateral ankle wound. The area on her left medial ankle that reopened looks a little bit larger today. She has quite a bit of pain associated with the new wound. 12/12/2021: Her wounds look about the same but the new opening on her right lateral dorsal foot is a little bit bigger. She continues to have a fair amount of pain with this wound. 12/26/2021: The left medial ankle wound is tiny and superficial. She has 2 areas of crusting on  her left lateral ankle, however, that appear to be threatening to open again. Her right medial ankle wound is a little bit  smaller today but still continues to accumulate thick rubbery slough. The new dorsal foot wound is exquisitely painful but there is no odor or purulent drainage. No erythema or induration. The right lateral ankle wound looks about the same today, again with thick rubbery slough. 01/03/2022: The left medial ankle wound has closed again. Both right ankle wounds appear to be about the same size with thick rubbery slough. The dorsal foot wound on the right continues to be quite painful and she stated that she did not want any debridement of that site today. 01/10/2022: No real change to any of her wounds. She continues to accumulate thick slough. The dorsal foot wound has merged with the lateral malleolar wound. She is experiencing significant pain in the dorsal foot portion of the ulcer. 01/16/2022: Absolutely no change or progress in her wounds. 01/24/2022: Her wounds are unchanged. She continues to build up slough and the wounds on her dorsal right foot are still exquisitely tender. 01/31/2022: The wounds actually measure a little bit narrower today. They still have thick slough on the surface but the underlying tissue seems a little less fibrotic. We changed to Iodosorb last week. 02/07/2022: Wounds continue to slowly epithelialize around the parameters. She has less pain today. She still accumulates a fairly substantial layer of slough. 02/15/2022: No change to the wounds overall. The measured a little bit larger today per the intake nurse. She continues to have substantial slough accumulation and her pain is a little bit worse. 02/21/2022. No change at all to her wounds. I do not see that plastic surgery has received her referral yet. 02/28/2022: The wounds remain unchanged. They are dry and fibrotic with accumulation of slough and eschar. She did receive an appointment to see plastic surgery on October 23, but the office called her back and indicated they needed to reschedule and that the next available  appointment was not until December. The patient became angry and decided she did not want to see this plastics group. 10/19; the patient sees Dr. Lovett Calender again next week. She is using Medihoney as the primary dressing changing this herself. Electronic Signature(s) Signed: 03/07/2022 4:29:29 PM By: Linton Ham MD Entered By: Linton Ham on 03/07/2022 11:38:35 [name] ([id number]) [id number] Page 4 of 11 -------------------------------------------------------------------------------- Physical Exam Details Patient Name: Date of Service: [name] [name] 03/07/2022 10:15 A M Medical Record Number: [id number] Patient Account Number: [id number] Date of Birth/Sex: Treating RN: 08-06-1970 (51 y.o. F) Primary Care Provider: [name] Other Clinician: Referring Provider: Treating Provider/Extender: Bernette Redbird in Treatment: 61 Constitutional Patient is hypertensive.. Pulse regular and within target range for patient.Marland Kitchen Respirations regular, non-labored and within target range.. Temperature is normal and within the target range for the patient.Marland Kitchen Appears in no distress. Cardiovascular Pedal pulses are palpable on the right. There is no edema. Notes Wounds on the right lateral and right medial ankle. Substantial. 100% covered in adherent slough and eschar. There is no evidence of surrounding infection Electronic Signature(s) Signed: 03/07/2022 4:29:29 PM By: Linton Ham MD Entered By: Linton Ham on 03/07/2022 11:40:36 -------------------------------------------------------------------------------- Physician Orders Details Patient Name: Date of Service: [name], [name] 03/07/2022 10:15 A M Medical Record Number: [id number] Patient Account Number: [id number] Date of Birth/Sex: Treating RN: [birth year] (51 y.o. [name] Primary Care Provider: [name] Other Clinician: Referring Provider: Treating  Provider/Extender:  Farrel Demark Weeks in Treatment: 68 Verbal / Phone Orders: No Diagnosis Coding Follow-up Appointments ppointment in 2 weeks. - Dr. Celine Ahr Room 3 Return A Anesthetic (In clinic) Topical Lidocaine 5% applied to wound bed Bathing/ Shower/ Hygiene May shower with protection but do not get wound dressing(s) wet. - Can get cast protector bags at [facility] or [facility] Edema Control - Lymphedema / SCD / Other Elevate legs to the level of the heart or above for 30 minutes daily and/or when sitting, a frequency of: - throughout the day Avoid standing for long periods of time. Exercise regularly Additional Orders / Instructions Follow Nutritious Diet Wound Treatment Wound #17 - Lower Leg Wound Laterality: Right, Lateral Cleanser: Soap and Water Every Other Day/30 Days Discharge Instructions: May shower and wash wound with dial antibacterial soap and water prior to dressing change. Cleanser: Wound Cleanser Every Other Day/30 Days Discharge Instructions: Cleanse the wound with wound cleanser prior to applying a clean dressing using gauze sponges, not tissue or cotton balls. [name], [name] ([id number]) [id number] Page 5 of 11 Peri-Wound Care: Triamcinolone 15 (g) Every Other Day/30 Days Discharge Instructions: Use triamcinolone 15 (g) as directed Peri-Wound Care: Sween Lotion (Moisturizing lotion) Every Other Day/30 Days Discharge Instructions: Apply moisturizing lotion as directed Prim Dressing: MediHoney Gel, tube 1.5 (oz) Every Other Day/30 Days ary Discharge Instructions: Apply to wound bed as instructed Secondary Dressing: ABD Pad, 5x9 Every Other Day/30 Days Discharge Instructions: Apply over primary dressing as directed. Secondary Dressing: Zetuvit Plus 4x8 in Every Other Day/30 Days Discharge Instructions: Apply over primary dressing as directed. Secured With: Elastic Bandage 4 inch (ACE bandage) Every Other Day/30  Days Discharge Instructions: Secure with ACE bandage as directed. Compression Wrap: Kerlix Roll 4.5x3.1 (in/yd) Every Other Day/30 Days Discharge Instructions: Apply Kerlix and Coban compression as directed. Wound #21 - Ankle Wound Laterality: Right, Medial Cleanser: Soap and Water Every Other Day/30 Days Discharge Instructions: May shower and wash wound with dial antibacterial soap and water prior to dressing change. Cleanser: Wound Cleanser Every Other Day/30 Days Discharge Instructions: Cleanse the wound with wound cleanser prior to applying a clean dressing using gauze sponges, not tissue or cotton balls. Peri-Wound Care: Triamcinolone 15 (g) Every Other Day/30 Days Discharge Instructions: Use triamcinolone 15 (g) as directed Peri-Wound Care: Sween Lotion (Moisturizing lotion) Every Other Day/30 Days Discharge Instructions: Apply moisturizing lotion as directed Prim Dressing: MediHoney Gel, tube 1.5 (oz) Every Other Day/30 Days ary Discharge Instructions: Apply to wound bed as instructed Secondary Dressing: ABD Pad, 5x9 Every Other Day/30 Days Discharge Instructions: Apply over primary dressing as directed. Secondary Dressing: Zetuvit Plus 4x8 in Every Other Day/30 Days Discharge Instructions: Apply over primary dressing as directed. Secured With: Elastic Bandage 4 inch (ACE bandage) Every Other Day/30 Days Discharge Instructions: Secure with ACE bandage as directed. Compression Wrap: Kerlix Roll 4.5x3.1 (in/yd) Every Other Day/30 Days Discharge Instructions: Apply Kerlix and Coban compression as directed. Electronic Signature(s) Signed: 03/07/2022 4:29:29 PM By: Linton Ham MD Signed: 03/07/2022 5:03:48 PM By: Sharyn Creamer RN, BSN Entered By: Sharyn Creamer on 03/07/2022 10:56:50 -------------------------------------------------------------------------------- Problem List Details Patient Name: Date of Service: [name], [name] 03/07/2022 10:15 A M Medical Record Number:  [id number] Patient Account Number: [id number] Date of Birth/Sex: Treating RN: 10-27-1970 (51 y.o. F) Primary Care Provider: [name] Other Clinician: Referring Provider: Treating Provider/Extender: Bernette Redbird in Treatment: [address] 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 F) Primary Care Provider: [name] Other Clinician: Referring Provider: Treating Provider/Extender: Farrel Demark Weeks in Treatment: 62 Subjective History of Present Illness (HPI) The following HPI elements were documented for the patient's wound: Location: medial and lateral ankle region on the right and left medial malleolus Quality: Patient reports experiencing a shooting pain to affected area(s). Severity: Patient states wound(s) are getting worse. Duration: right lower extremity bimalleolar ulcers have been present for approximately 2 years; the rright meedial malleolus ulcer has been there proximally 6 months Timing: Pain in wound is constant  (hurts all the time) Context: The wound would happen gradually Associated Signs and Symptoms: Patient reports having increase discharge. 51 year old patient with a history of sickle cell anemia who was last seen by me with ulceration of the right lower extremity above the ankle and was referred to Dr. Leland Johns for a surgical debridement as I was unable to do anything in the office due to excruciating pain. At that stage she was referred from the plastic surgery service to dermatology who treated her for a skin infection with doxycycline and then Levaquin and a local antibiotic ointment. I understand the patient has since developed ulceration on the left ankle both medial and lateral and was now referred back to the wound center as dermatology has finished the management. I do not have any notes from the dermatology department Old notes: 51 year old patient with a history of sickle cell anemia, pain bilateral lower extremities, right lower extremity ulcer and has a history of receiving a skin graft( Theraskin) several months ago. She has been visiting the wound center [facility] and was seen by Dr. Dellia Nims and Dr. Leland Johns. after prolonged conservator therapy between July 2016 and January 2017. She had been seen by the plastic surgeon and taken to the OR for debridement and application of Theraskin. She had 3 applications of Theraskin and was then treated with collagen. Prior to that she had a history of similar problems in 2014 and was treated conservatively. Had a reflux study done for the right lower extremity in August 2016 without reflux or DVT . Past medical history significant for sickle cell disease, anemia, leg ulcers, cholelithiasis,and has never been a smoker. Once the patient was discharged on the wound center she says within 2 or 3 weeks the problems recurred and she has been treating it conservatively. since I saw her 3 weeks ago at [facility] she has been unable to get her dressing  material but has completed a course of doxycycline. 6/7/ 2017 -- lower extremity venous duplex reflux evaluation was done oo No evidence of SVT or DVT in the RLL. No venous incompetence in the RLL. No further vascular workup is indicated at this time. She was seen by Dr. [name], on 10/04/2015. She agreed with the plan of taking her to the OR for debridement and application of theraskin and would  also take biopsies to rule out pyoderma gangrenosum. Follow-up note dated May 31 received and she was status post application of Theraskin to multiple ulcers around the right ankle. Pathology did not show evidence of malignancy or pyoderma gangrenosum. She would continue to see as in the wound clinic for further care and see Dr. Leland Johns as needed. [name], [name] ([id number]) [id number] Page 7 of 11 The patient brought the biopsy report and it was consistent with stasis ulcer no evidence of malignancy and the comment was that there was some adjacent neovascularization, fibrosis and patchy perivascular chronic inflammation. 11/15/2015 -- today we applied her first application of Theraskin 11/30/15; TheraSkin #2 12/13/2015 -- she is having a lot of pain locally and is here for possible application of a theraskin today. 01/16/2016 -- the patient has significant pain and has noticed despite in spite of all local care and oral pain medication. It is impossible to debride her in the office. 02/06/2016 -- I do not see any notes from Dr. Iran Planas( the patient has not made a call to the office know as she heard from them) and the only visit to recently was with her PCP Dr. Danella Penton -- I saw her on 01/16/2016 and prescribed 90 tablets of oxycodone 10 mg and did lab work and screening for HIV. the HIV was negative and hemoglobin was 6.3 with a WBC count of 14.9 and hematocrit of 17.8 with platelets of 561. reticulocyte count was 15.5% READMISSION: 07/10/2016- The patient is  here for readmission for bilateral lower extremity ulcers in the presence of sickle cell. The bimalleolar ulcers to the right lower extremity have been present for approximately 2 years, the left medial malleolus ulcer has been present approximately 6 months. She has followed with Dr.Thimmappa in the past and has had a total of 3 applications of Theraskin (01/2015, 09/2015, 06/17/16). She has also followed with Dr. Con Memos here in the clinic and has received 2 applications of TheraSkin (11/10/15, 11/30/15). The patient does experience chronic, and is not amenable to debridement. 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 She is not currently on any antibiotic therapy and has not been treated with any recently. 07/17/2016 -- was seen by Dr. Iran Planas of plastic surgery who saw her 2 weeks postop application of Theraskin #3. She had removed her dressing and asked her to apply silver alginate on alternate days and follow-up back with the wound center. Future debridements and application of skin substitute would have to be done in the hospital due to her high risk for anesthesia. READMISSION 04/17/2021 Patient is now a 51 year old woman that we have had in this clinic for a prolonged period of time and 2016-2017 and then again for 2 visits in February 2018. At that point she had wounds on the right lower leg predominantly medial. She had also been seen by plastic surgery Dr. Leland Johns who I believe took her to the OR for operative debridement and application of TheraSkin in 2017. After she left our clinic she was followed for a very prolonged period of time in the wound care center in [facility] who then referred her ultimately to [facility] where she was seen by Dr. Vernona Rieger. Again taken her to the OR for skin grafting which apparently did not take. She had multiple other attempts at dressings although I have not really looked over all of these notes in great detail. She has  not been seen in a wound care center in  about a year. She states over the last year in addition to her right lower leg she has developed wounds on the left lower leg quite extensive. She is using Xeroform to all of these wounds without really any improvement. She also has Medicaid which does not cover wound products. The patient has had vascular work-ups in the past including most recently on 03/28/2021 showing biphasic waveforms on the right triphasic at the PTA and biphasic at the dorsalis pedis on the left. She was unable to tolerate any degree of compression to do ABIs. Unfortunately TBI's were also not done. She had venous reflux studies done in 2017. This did not show any evidence of a DVT or SVT and no venous incompetence was noted in the right leg at the time this was the only side with the wound As noted I did not look all over her old records. She apparently had a course of HBO and [facility] although I am not sure what the indication would have been. In any case she developed seizures and terminated treatment earlier. She is generally much more disabled than when we last saw her in clinic. She can no longer walk pretty much wheelchair-bound because predominantly of pain in the left hip. 04/24/2021; the patient tolerated the wraps we put on. We used Santyl and Hydrofera Blue under compression. I brought her back for a nurse visit for a change in dressing. With Medicaid we will have a hard time getting anything paid for and hence the need for compression. She arrives in clinic with all the wounds looking somewhat better in terms of surface 12/20; circumferential wound on the right from the lateral to the medial. She has open areas on the left medial and left lateral x2 on all of this with the same surface. This does not look completely healthy although she does have some epithelialization. She is not complaining of a lot of pain which is unusual for her sickle ulcers. I have not looked over her  extensive records from [facility]. She had recent arterial studies and has a history of venous reflux studies I will need to look these over although I do not believe she has significant arterial disease 2023 05/22/2021; patient's wound areas measure slightly smaller. Still a lot of drainage coming from the right we have been using Hydrofera Blue and Santyl with some improvement in the wound surfaces. She tells me she will be getting transfused later in the week for her underlying sickle cell anemia I have looked over her recent arterial studies which were done in the fall. This was in November and showed biphasic and triphasic waveforms but she could not tolerate ABIs because of pressure and unfortunately TBI's were not done. She has not had recent venous reflux studies that I can see 1/10; not much change about the same surface area. This has a yellowish surface to it very gritty. We have been using Santyl and Hydrofera Blue for a prolonged period. Culture I did last week showed methicillin sensitive staph aureus "rare". Our intake nurse reports greenish drainage which may be the Hydrofera Blue itself 1/17; wounds are continue to measure smaller although I am not sure about the accuracy here. Especially the areas on the right are covered in what looks to be a nonviable surface although she does have some epithelialization. Similarly she has areas on the left medial and left lateral ankle area which appear to have a better surface and perhaps are slightly smaller. We have been using Santyl and  Hydrofera Blue. She cannot tolerate mechanical debridements She went for her reflux studies which showed significant reflux at the greater saphenous vein at the saphenofemoral junction as well as the greater saphenous vein in the proximal calf on the left she had reflux in the thigh and the common femoral vein and supra vein Fishel vein reflux in the greater saphenous vein. I will have vein and vascular look at  this. My thoughts have been that these are likely sickle wounds. I looked through her old records from [facility] wound care center and then when she graduated to  [facility] wound care center where she saw Dr. Zigmund Daniel and Dr. Vernona Rieger. Although I can see she had reflux studies done I do not see that she actually saw a vein and vascular. I went over the fact that she had operative debridements and actual skin grafting that did not take. I do not think these wounds have ever really progressed towards healing 1/31;Substantial wounds on the right ankle area. Hyper granulated very gritty adherent debris on the surface. 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 On the left she has no DVT or SVT deep vein reflux in the common femoral vein. Superficial vein reflux in the greater saphenous vein on although the vein diameter is not really all that large. I do not think there is anything that can be done with these although I am going to send her for consultation to vein and vascular. 2/7; Wound exam; substantial wound area on the right posterior ankle area and areas on the left medial ankle and left lateral ankle. I was able to debride the left medial ankle last week fairly aggressively and it is back this week to a completely nonviable surface She will see vascular surgery this Friday and I would like them to review the venous studies and also any comments on her arterial status. If they do not see an issue here I am going to refer her to plastic surgery for an operative debridement perhaps intraoperative ACell or Integra. Eventually she will require  a deep tissue culture again [name], [name] ([id number]) [id number] Page 8 of 11 2/14; substantial wound area on the right posterior ankle, medial ankle. We have been using silver alginate The patient was seen by vein and vascular she had both venous reflux studies and arterial studies. 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 She had no evidence of superficial venous thrombosis. She did have superficial vein reflux at the saphenofemoral junction and the greater saphenous vein. On the left no evidence of a DVT no evidence of superficial venous throat thrombosis she did have deep vein reflux in the common femoral vein and superficial vein reflux in the greater saphenous vein but these were not felt to be amenable to ablation. In terms of arterial studies she had triphasic and wife biphasic wave waveforms bilaterally not felt to have a significant arterial issue. I do not get the feeling that they felt that any part of her nonhealing wounds were related to either arterial or venous  issues. They did note that she had venous reflux at the right at the [facility] and [facility]. And also on the left there were reflux in the deep system at the common femoral vein and greater saphenous vein in the proximal thigh. Nothing amenable to ablation. 2/20; she is making some decent progress on the right where there is nice skin between the 2 open areas on the right ankle. The surfaces here do not look viable yet there is some surrounding epithelialization. She still has a small area on the left medial ankle area. Hyper-granulated [name] 2/28 patient has an appointment with plastic surgery on 3/8. We will see her back on 3/9. She may have to call us to get the area redressed. We've been using Santyl under silver alginate. We made a nice improvement on the left medial ankle. The larger wounds on the right also looks  somewhat better in terms of epithelialization although I think they could benefit from an aggressive debridement if plastic surgery would be willing to do that. Perhaps placement of Integra or a cell 07/26/2021: She saw Dr. Claudia Desanctis yesterday. He raised the question as to whether or not this might be pyoderma and wanted to wait until that question was answered by dermatology before proceeding with any sort of operative debridement. We have continue to use Santyl under silver alginate with Kerlix and Coban wraps. Overall, her wounds appear to be continuing to contract and epithelialize, with some granulation tissue present. There continues to be some slough on all wound surfaces. 08/09/2021: She has not been able to get an appointment with dermatology because apparently the offices in [place] do not accept Medicaid. She is looking into whether or not she can be seen at the main [facility] dermatology clinic. This is necessary because plastic surgery is concerned that her wounds might represent pyoderma and they did not want to do any procedure until that was clarified. We have been using Santyl under silver alginate with Kerlix and Coban wraps. Today, there was a greater amount of drainage on her dressings with a slight green discoloration and significant odor. Despite this, her wounds continue to contract and epithelialize. There is pale granulation tissue present and actually, on the left medial ankle, the granulation tissue is a bit hypertrophic. 08/16/2021: Last week, I took a culture and this grew back rare methicillin-resistant Staph aureus and rare corynebacterium. The MRSA was sensitive to gentamicin which we began applying topically on an empiric basis. This week, her wounds are a bit smaller and the drainage and odor are less. Her primary care provider is working on assisting the patient with a dermatology evaluation. She has been in silver alginate over the gentamicin that  was started last week along with Kerlix and Coban wraps. 08/23/2021: Because she has Medicaid, we have been unable to get her into see any dermatologist in the [place] to rule out pyoderma gangrenosum, which was a requirement from plastic surgery prior to any sort of debridement and grafting. Despite this, however, all of her wounds continue to get smaller. The wound on her left medial ankle is nearly closed. There is no odor from the wounds, although she still accumulates a modest amount of drainage on her dressings. 08/30/2021: The lateral right ankle wound and the medial left ankle wound are a bit smaller today. The medial right ankle wound is about the same size. They are less tender. We have still been unable to get her into dermatology. 09/06/2021: All  of the wounds are about the same size today. She continues to endorse minimal pain. I communicated with Dr. Claudia Desanctis in plastic surgery regarding our issues getting a dermatology appointment; he was out of town but indicated that he would look into perhaps performing the biopsy in his office and will have his office contact her. 09/14/2021: The patient has an appointment in dermatology, but it is not until October. Her wounds are roughly the same; she continues to have very thick purulent-looking drainage on her dressings. 09/20/2021: The left medial wound is nearly closed and just has a bit of accumulated eschar on the surface. The right medial and lateral ankle wounds are perhaps a little bit smaller. They continue to have a very pale surface with accumulation of thin slough. 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 I did not think [employer] was indicated based on this. She is getting topical mupirocin with Prisma silver collagen. 10/04/2021: The patient was not seen in clinic last week due to childcare coverage issues. In the interim, the left medial leg wound has closed. The right sided leg wounds are smaller. There is more  granulation tissue coming through, particularly on the lateral wound. The surface remains somewhat gritty. We have been applying topical mupirocin and Prisma silver collagen. 10/11/2021: The left medial leg wound remains closed. She does complain of some anesthetic sensation to the area. Both of the right-sided leg wounds are smaller but still have accumulated slough. 10/18/2021: Both right-sided leg wounds are minimally smaller this week. She still continues to accumulate slough and has thick drainage on her dressings. 10/23/2021: Both wounds continue to contract. There is still slough buildup. She has been approved for a keratin-based skin substitute trial product but it will not be available until next week. 10/30/2021: The wounds are about the same to perhaps slightly smaller. There is still continued slough buildup. Unfortunately, the rep for the keratin based product did not show up today and did not answer his phone when called. 11/08/2021: The wounds are little bit smaller today. She continues to have thick drainage but the surfaces are relatively clean with just a little bit of slough accumulation. She reported to me today that she is unable to completely flex her left ankle and on examination it seems this is potentially related to scar tissue from her wounds. We do have the ProgenaMatrix trial product available for her today. 11/15/2021: Both wounds are smaller today. There is some slough accumulation on the surfaces, but the medial wound, in particular looks like it is filling in and is less deep. She did hear from physical therapy and she is going to start working with them on July 11. She is here for her second application of the trial skin substitute, ProgenaMatrix. 11/22/2021: Both wounds continue to contract, the medial more dramatically than the lateral. Both wounds have a layer of slough on the surface, but underneath this, the gritty fibrous tissue has a little bit more of a pink cast to it  rather than being as pale as it has been. 11/29/2021: The wounds are roughly the same size this week, perhaps a millimeter or 2 smaller. The medial wound has filled in and is nearly flush with the surrounding skin surface. She continues to have a lot of slough accumulation on both surfaces. 12/06/2021: No significant change to her wounds, but she has a new opening on her dorsal foot, just distal to the right lateral ankle wound. The area on her left  medial ankle that reopened looks a little bit larger today. She has quite a bit of pain associated with the new wound. 12/12/2021: Her wounds look about the same but the new opening on her right lateral dorsal foot is a little bit bigger. She continues to have a fair amount of pain with this wound. 12/26/2021: The left medial ankle wound is tiny and superficial. She has 2 areas of crusting on her left lateral ankle, however, that appear to be threatening to open again. Her right medial ankle wound is a little bit smaller today but still continues to accumulate thick rubbery slough. The new dorsal foot wound is exquisitely painful but there is no odor or purulent drainage. No erythema or induration. The right lateral ankle wound looks about the same today, again with [name], [name] ([id number]) [id number] Page 9 of 11 thick rubbery slough. 01/03/2022: The left medial ankle wound has closed again. Both right ankle wounds appear to be about the same size with thick rubbery slough. The dorsal foot wound on the right continues to be quite painful and she stated that she did not want any debridement of that site today. 01/10/2022: No real change to any of her wounds. She continues to accumulate thick slough. The dorsal foot wound has merged with the lateral malleolar wound. She is experiencing significant pain in the dorsal foot portion of the ulcer. 01/16/2022: Absolutely no change or progress in her wounds. 01/24/2022: Her wounds are  unchanged. She continues to build up slough and the wounds on her dorsal right foot are still exquisitely tender. 01/31/2022: The wounds actually measure a little bit narrower today. They still have thick slough on the surface but the underlying tissue seems a little less fibrotic. We changed to Iodosorb last week. 02/07/2022: Wounds continue to slowly epithelialize around the parameters. She has less pain today. She still accumulates a fairly substantial layer of slough. 02/15/2022: No change to the wounds overall. The measured a little bit larger today per the intake nurse. She continues to have substantial slough accumulation and her pain is a little bit worse. 02/21/2022. No change at all to her wounds. I do not see that plastic surgery has received her referral yet. 02/28/2022: The wounds remain unchanged. They are dry and fibrotic with accumulation of slough and eschar. She did receive an appointment to see plastic surgery on October 23, but the office called her back and indicated they needed to reschedule and that the next available appointment was not until December. The patient became angry and decided she did not want to see this plastics group. 10/19; the patient sees Dr. Lovett Calender again next week. She is using Medihoney as the primary dressing changing this herself. Objective Constitutional Patient is hypertensive.. Pulse regular and within target range for patient.Marland Kitchen Respirations regular, non-labored and within target range.. Temperature is normal and within the target range for the patient.Marland Kitchen Appears in no distress. Vitals Time Taken: 10:24 AM, Height: 67 in, Temperature: 98.2 F, Pulse: 96 bpm, Respiratory Rate: 18 breaths/min, Blood Pressure: 152/77 mmHg. Cardiovascular Pedal pulses are palpable on the right. There is no edema. General Notes: Wounds on the right lateral and right medial ankle. Substantial. 100% covered in adherent slough and eschar. There is no evidence of surrounding  infection Integumentary (Hair, Skin) Wound #17 status is Open. Original cause of wound was Gradually Appeared. The date acquired was: 10/05/2012. The wound has been in treatment 46 weeks. The wound is located on the Right,Lateral Lower Leg. The wound measures  6.9cm length x 5.3cm width x 0.1cm depth; 28.722cm^2 area and 2.872cm^3 volume. There is Fat Layer (Subcutaneous Tissue) exposed. There is no tunneling or undermining noted. There is a large amount of serosanguineous drainage noted. The wound margin is distinct with the outline attached to the wound base. There is small (1-33%) pink, pale granulation within the wound bed. There is a large (67-100%) amount of necrotic tissue within the wound bed including Adherent Slough. The periwound skin appearance had no abnormalities noted for texture. The periwound skin appearance had no abnormalities noted for moisture. The periwound skin appearance had no abnormalities noted for color. Periwound temperature was noted as No Abnormality. Wound #21 status is Open. Original cause of wound was Gradually Appeared. The date acquired was: 06/26/2021. The wound has been in treatment 36 weeks. The wound is located on the Right,Medial Ankle. The wound measures 7.5cm length x 3.3cm width x 0.1cm depth; 19.439cm^2 area and 1.944cm^3 volume. There is Fat Layer (Subcutaneous Tissue) exposed. There is no tunneling or undermining noted. There is a medium amount of serosanguineous drainage noted. The wound margin is distinct with the outline attached to the wound base. There is small (1-33%) pink granulation within the wound bed. There is a large (67- 100%) amount of necrotic tissue within the wound bed including Adherent Slough. The periwound skin appearance had no abnormalities noted for texture. The periwound skin appearance had no abnormalities noted for moisture. The periwound skin appearance had no abnormalities noted for color. Periwound temperature was noted as No  Abnormality. 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 - Dr. Celine Ahr Room 3 Anesthetic: (In clinic) Topical Lidocaine 5% applied to wound bed Bathing/ Shower/ Hygiene: May shower with protection but do not get wound dressing(s) wet. - Can get cast protector bags at [facility] or [facility] Edema Control - Lymphedema / SCD / Other: Elevate legs to the level of the heart or above for 30 minutes daily and/or when sitting, a frequency of: - throughout the day Avoid standing for long periods of time. Exercise regularly Additional Orders / Instructions: Follow Nutritious Diet WOUND #17: - Lower Leg Wound Laterality: Right, Lateral Cleanser: Soap and Water Every Other Day/30 Days Discharge Instructions: May shower and wash wound with dial antibacterial soap and water prior to dressing change. Cleanser: Wound Cleanser Every Other Day/30 Days Discharge Instructions: Cleanse the wound with wound cleanser prior to applying a clean dressing using gauze sponges, not tissue or cotton balls. Peri-Wound Care: Triamcinolone 15 (g) Every Other Day/30 Days Discharge Instructions: Use triamcinolone 15 (g) as directed Peri-Wound Care: Sween Lotion (Moisturizing lotion) Every Other Day/30 Days Discharge Instructions: Apply moisturizing lotion as directed Prim Dressing: MediHoney Gel, tube 1.5 (oz) Every Other Day/30 Days ary Discharge Instructions: Apply to wound bed as instructed Secondary Dressing: ABD Pad, 5x9 Every Other Day/30 Days Discharge Instructions: Apply over primary dressing as directed. Secondary Dressing: Zetuvit Plus 4x8 in Every Other Day/30 Days Discharge Instructions: Apply over primary dressing as directed. Secured With: Elastic Bandage  4 inch (ACE bandage) Every Other Day/30 Days Discharge Instructions: Secure with ACE bandage as directed. Com pression Wrap: Kerlix Roll 4.5x3.1 (in/yd) Every Other Day/30 Days Discharge Instructions: Apply Kerlix and Coban compression as directed. WOUND #21: - Ankle Wound Laterality: Right, Medial Cleanser: Soap and Water Every Other Day/30 Days Discharge Instructions: May  shower and wash wound with dial antibacterial soap and water prior to dressing change. Cleanser: Wound Cleanser Every Other Day/30 Days Discharge Instructions: Cleanse the wound with wound cleanser prior to applying a clean dressing using gauze sponges, not tissue or cotton balls. Peri-Wound Care: Triamcinolone 15 (g) Every Other Day/30 Days Discharge Instructions: Use triamcinolone 15 (g) as directed Peri-Wound Care: Sween Lotion (Moisturizing lotion) Every Other Day/30 Days Discharge Instructions: Apply moisturizing lotion as directed Prim Dressing: MediHoney Gel, tube 1.5 (oz) Every Other Day/30 Days ary Discharge Instructions: Apply to wound bed as instructed Secondary Dressing: ABD Pad, 5x9 Every Other Day/30 Days Discharge Instructions: Apply over primary dressing as directed. Secondary Dressing: Zetuvit Plus 4x8 in Every Other Day/30 Days Discharge Instructions: Apply over primary dressing as directed. Secured With: Elastic Bandage 4 inch (ACE bandage) Every Other Day/30 Days Discharge Instructions: Secure with ACE bandage as directed. Com pression Wrap: Kerlix Roll 4.5x3.1 (in/yd) Every Other Day/30 Days Discharge Instructions: Apply Kerlix and Coban compression as directed. 1. The wound surface is in need of an aggressive debridement however this cannot be done as an outpatient. 2. Sees Dr. Lovett Calender next week the question will be an operative debridement with some form of skin substitute. The patient says she does not want a skin graft. Dr. Vernona Rieger has done this in the past we will see what he says next week and  see her back after that. 3. No change to the primary dressings Electronic Signature(s) Signed: 03/07/2022 4:29:29 PM By: Linton Ham MD Entered By: Linton Ham on 03/07/2022 11:42:32 -------------------------------------------------------------------------------- SuperBill Details Patient Name: Date of Service: [place], [name] 03/07/2022 Medical Record Number: [id number] Patient Account Number: [id number] Date of Birth/Sex: Treating RN: 1970-07-03 (51 y.o. 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

## 2022-03-07 NOTE — Progress Notes (Signed)
[name] ([id number]) [id number] Page 1 of 11 Visit Report for 03/07/2022 Arrival Information Details Patient Name: Date of Service: [name] [name] 03/07/2022 10:15 A M Medical Record Number: [id number] Patient Account Number: [id number] Date of Birth/Sex: Treating RN: [birth year] (51 y.o. [name] Primary Care [name]: [name] Other Clinician: Referring [name]: Treating [name]/Extender: [name] in Treatment: 72 Visit Information History Since Last Visit Added or deleted any medications: No Patient Arrived: Ambulatory Any new allergies or adverse reactions: No Arrival Time: 10:21 Had a fall or experienced change in No Accompanied By: self activities of daily living that may affect Transfer Assistance: None risk of falls: Patient Identification Verified: Yes Signs or symptoms of abuse/neglect since last visito No Secondary Verification Process Completed: Yes Hospitalized since last visit: No Patient Requires Transmission-Based Precautions: No Implantable device outside of the clinic excluding No Patient Has Alerts: No cellular tissue based products placed in the center since last visit: Has Dressing in Place as Prescribed: Yes Has Compression in Place as Prescribed: Yes Pain Present Now: Yes Electronic Signature(s) Signed: 03/07/2022 5:03:48 PM By: [name] Creamer RN, BSN Entered By: [name] on 03/07/2022 10:22:52 -------------------------------------------------------------------------------- Clinic Level of Care Assessment Details Patient Name: Date of Service: [name], [place] [name] 03/07/2022 10:15 A M Medical Record Number: [id number] Patient Account Number: [id number] Date of Birth/Sex: Treating RN: April 26, 1971 (51 y.o. [name] Primary Care Javonne Louissaint: [name] Other Clinician: Referring [name]: Treating [name]/Extender: [name] in  Treatment: 46 Clinic Level of Care Assessment Items TOOL 4 Quantity Score X- 1 0 Use when only an EandM is performed on FOLLOW-UP visit ASSESSMENTS - Nursing Assessment / Reassessment X- 1 10 Reassessment of Co-morbidities (includes updates in patient status) X- 1 5 Reassessment of Adherence to Treatment Plan ASSESSMENTS - Wound and Skin A ssessment / Reassessment []  - 0 Simple Wound Assessment / Reassessment - one wound X- 2 5 Complex Wound Assessment / Reassessment - multiple wounds []  - 0 Dermatologic / Skin Assessment (not related to wound area) ASSESSMENTS - Focused Assessment X- 1 5 Circumferential Edema Measurements - multi extremities []  - 0 Nutritional Assessment / Counseling / Intervention [name] ([id number]) [id number] Page 2 of 11 []  - 0 Lower Extremity Assessment (monofilament, tuning fork, pulses) []  - 0 Peripheral Arterial Disease Assessment (using hand held doppler) ASSESSMENTS - Ostomy and/or Continence Assessment and Care []  - 0 Incontinence Assessment and Management []  - 0 Ostomy Care Assessment and Management (repouching, etc.) PROCESS - Coordination of Care X - Simple Patient / Family Education for ongoing care 1 15 []  - 0 Complex (extensive) Patient / Family Education for ongoing care X- 1 10 Staff obtains Programmer, systems, Records, T Results / Process Orders est []  - 0 Staff telephones HHA, Nursing Homes / Clarify orders / etc []  - 0 Routine Transfer to another Facility (non-emergent condition) []  - 0 Routine Hospital Admission (non-emergent condition) []  - 0 New Admissions / Biomedical engineer / Ordering NPWT Apligraf, etc. , []  - 0 Emergency Hospital Admission (emergent condition) []  - 0 Simple Discharge Coordination []  - 0 Complex (extensive) Discharge Coordination PROCESS - Special Needs []  - 0 Pediatric / Minor Patient Management []  - 0 Isolation Patient Management []  - 0 Hearing / Language /  Visual special needs []  - 0 Assessment of Community assistance (transportation, D/C planning, etc.) []  - 0 Additional assistance / Altered mentation []  - 0 Support Surface(s) Assessment (bed, cushion, seat, etc.) INTERVENTIONS - Wound  Cleansing / Measurement []  - 0 Simple Wound Cleansing - one wound X- 2 5 Complex Wound Cleansing - multiple wounds X- 1 5 Wound Imaging (photographs - any number of wounds) []  - 0 Wound Tracing (instead of photographs) []  - 0 Simple Wound Measurement - one wound X- 2 5 Complex Wound Measurement - multiple wounds INTERVENTIONS - Wound Dressings []  - 0 Small Wound Dressing one or multiple wounds X- 1 15 Medium Wound Dressing one or multiple wounds []  - 0 Large Wound Dressing one or multiple wounds []  - 0 Application of Medications - topical []  - 0 Application of Medications - injection INTERVENTIONS - Miscellaneous []  - 0 External ear exam []  - 0 Specimen Collection (cultures, biopsies, blood, body fluids, etc.) []  - 0 Specimen(s) / Culture(s) sent or taken to Lab for analysis []  - 0 Patient Transfer (multiple staff / Civil Service fast streamer / Similar devices) []  - 0 Simple Staple / Suture removal (25 or less) []  - 0 Complex Staple / Suture removal (26 or more) []  - 0 Hypo / Hyperglycemic Management (close monitor of Blood Glucose) [name], [name] ([id number]) [id number] Page 3 of 11 []  - 0 Ankle / Brachial Index ([name]) - do not check if billed separately X- 1 5 Vital Signs Has the patient been seen at the hospital within the last three years: Yes Total Score: 100 Level Of Care: New/Established - Level 3 Electronic Signature(s) Signed: 03/07/2022 5:03:48 PM By: [name] Creamer RN, BSN Entered By: [name] on 03/07/2022 11:09:12 -------------------------------------------------------------------------------- Encounter Discharge Information Details Patient Name: Date of Service: [name], [name] 03/07/2022 10:15  A M Medical Record Number: [id number] Patient Account Number: [id number] Date of Birth/Sex: Treating RN: [birth year] (51 y.o. [name] Primary Care [name]: [name] Other Clinician: Referring Durenda Pechacek: Treating Gregor Dershem/Extender: [name] in Treatment: 46 Encounter Discharge Information Items Discharge Condition: Stable Ambulatory Status: Cane Discharge Destination: Home Transportation: Private Auto Accompanied By: self Schedule Follow-up Appointment: Yes Clinical Summary of Care: Patient Declined Electronic Signature(s) Signed: 03/07/2022 5:03:48 PM By: [name] Creamer RN, BSN Entered By: [name] on 03/07/2022 11:10:27 -------------------------------------------------------------------------------- Lower Extremity Assessment Details Patient Name: Date of Service: [name], [name] 03/07/2022 10:15 A M Medical Record Number: [id number] Patient Account Number: [id number] Date of Birth/Sex: Treating RN: February 04, 1971 (51 y.o. [name] Primary Care Clarice Bonaventure: [name] Other Clinician: Referring Dashanna Kinnamon: Treating Jeraline Marcinek/Extender: Farrel Demark Weeks in Treatment: 46 Edema Assessment Assessed: [Left: No] [Right: No] Edema: [Left: Ye] [Right: s] Calf Left: Right: Point of Measurement: 33 cm From Medial Instep 29.5 cm Ankle Left: Right: Point of Measurement: 10 cm From Medial Instep 21 cm Vascular Assessment Left: [[id number] Page 4 of 11Right:] Pulses: Dorsalis Pedis Palpable: [[id number] Page 4 of 11Yes] Electronic Signature(s) Signed: 03/07/2022 5:03:48 PM By: [name] Creamer RN, BSN Entered By: [name] on 03/07/2022 10:28:59 -------------------------------------------------------------------------------- Multi Wound Chart Details Patient Name: Date of Service: [name], [name] 03/07/2022 10:15 A M Medical Record Number:  [id number] Patient Account Number: [id number] Date of Birth/Sex: Treating RN: [birth year] (51 y.o. F) Primary Care Caylie Sandquist: [name] Other Clinician: Referring Karaline Buresh: Treating Rykar Lebleu/Extender: Farrel Demark Weeks in Treatment: 35 Vital Signs Height(in): 67 Pulse(bpm): 96 Weight(lbs): Blood Pressure(mmHg): 152/77 Body Mass Index(BMI): Temperature(F): 98.2 Respiratory Rate(breaths/min): 18 [17:Photos:] [N/A:N/A] Right, Lateral Lower Leg Right, Medial Ankle N/A Wound Location: Gradually Appeared Gradually Appeared N/A Wounding Event: [name] Cell Lesion [name] Cell Lesion N/A Primary Etiology: N/A Venous Leg Ulcer N/A  Secondary Etiology: Anemia, [name] Cell Disease, Anemia, [name] Cell Disease, N/A Comorbid History: Neuropathy Neuropathy 10/05/2012 06/26/2021 N/A Date Acquired: 73 36 N/A Weeks of Treatment: Open Open N/A Wound Status: No No N/A Wound Recurrence: Yes No N/A Clustered Wound: 6.9x5.3x0.1 7.5x3.3x0.1 N/A Measurements L x W x D (cm) 28.722 19.439 N/A A (cm) : rea 2.872 1.944 N/A Volume (cm) : 84.70% 32.90% N/A % Reduction in Area: 84.70% 32.90% N/A % Reduction in Volume: Full Thickness Without Exposed Full Thickness Without Exposed N/A Classification: Support Structures Support Structures Large Medium N/A Exudate Amount: Serosanguineous Serosanguineous N/A Exudate Type: red, brown red, brown N/A Exudate Color: Distinct, outline attached Distinct, outline attached N/A Wound Margin: Small (1-33%) Small (1-33%) N/A Granulation Amount: Pink, Pale Pink N/A Granulation Quality: Large (67-100%) Large (67-100%) N/A Necrotic Amount: Fat Layer (Subcutaneous Tissue): Yes Fat Layer (Subcutaneous Tissue): Yes N/A Exposed Structures: Fascia: No Fascia: No Tendon: No Tendon: No Muscle: No Muscle: No Joint: No Joint: No Bone: No Bone: No Small (1-33%) Small (1-33%) N/A Epithelialization: No Abnormalities Noted No  Abnormalities Noted N/A Periwound Skin Texture: No Abnormalities Noted No Abnormalities Noted N/A Periwound Skin MoistureERANDI, [name] ([id number]) [id number] Page 5 of 11 No Abnormalities Noted No Abnormalities Noted N/A Periwound Skin Color: No Abnormality No Abnormality N/A Temperature: Treatment Notes Wound #17 (Lower Leg) Wound Laterality: Right, Lateral Cleanser Soap and Water Discharge Instruction: May shower and wash wound with dial antibacterial soap and water prior to dressing change. Wound Cleanser Discharge Instruction: Cleanse the wound with wound cleanser prior to applying a clean dressing using gauze sponges, not tissue or cotton balls. Peri-Wound Care Triamcinolone 15 (g) Discharge Instruction: Use triamcinolone 15 (g) as directed Sween Lotion (Moisturizing lotion) Discharge Instruction: Apply moisturizing lotion as directed Topical Primary Dressing MediHoney Gel, tube 1.5 (oz) Discharge Instruction: Apply to wound bed as instructed Secondary Dressing ABD Pad, 5x9 Discharge Instruction: Apply over primary dressing as directed. Zetuvit Plus 4x8 in Discharge Instruction: Apply over primary dressing as directed. Secured With Elastic Bandage 4 inch (ACE bandage) Discharge Instruction: Secure with ACE bandage as directed. Compression Wrap Kerlix Roll 4.5x3.1 (in/yd) Discharge Instruction: Apply Kerlix and Coban compression as directed. Compression Stockings Add-Ons Wound #21 (Ankle) Wound Laterality: Right, Medial Cleanser Soap and Water Discharge Instruction: May shower and wash wound with dial antibacterial soap and water prior to dressing change. Wound Cleanser Discharge Instruction: Cleanse the wound with wound cleanser prior to applying a clean dressing using gauze sponges, not tissue or cotton balls. Peri-Wound Care Triamcinolone 15 (g) Discharge Instruction: Use triamcinolone 15 (g) as directed Sween Lotion (Moisturizing  lotion) Discharge Instruction: Apply moisturizing lotion as directed Topical Primary Dressing MediHoney Gel, tube 1.5 (oz) Discharge Instruction: Apply to wound bed as instructed Secondary Dressing ABD Pad, 5x9 Discharge Instruction: Apply over primary dressing as directed. Zetuvit Plus 4x8 in Discharge Instruction: Apply over primary dressing as directed. Secured With Elastic Bandage 4 inch (ACE bandage) Discharge Instruction: Secure with ACE bandage as directed. [name], [name] ([id number]) [id number] Page 6 of 11 Compression Wrap Kerlix Roll 4.5x3.1 (in/yd) Discharge Instruction: Apply Kerlix and Coban compression as directed. Compression Stockings Add-Ons Electronic Signature(s) Signed: 03/07/2022 4:29:29 PM By: Linton Ham MD Entered By: Linton Ham on 03/07/2022 11:37:28 -------------------------------------------------------------------------------- Multi-Disciplinary Care Plan Details Patient Name: Date of Service: [name], [place] [name] 03/07/2022 10:15 A M Medical Record Number: [id number] Patient Account Number: [id number] Date of Birth/Sex: Treating RN: 1970/09/14 (51 y.o. [name] Primary Care Javelle Donigan: [name] Other Clinician: Referring Marycruz Boehner:  Treating Janeth Terry/Extender: Farrel Demark Weeks in Treatment: 46 Multidisciplinary Care Plan reviewed with physician Active Inactive Venous Leg Ulcer Nursing Diagnoses: Actual venous Insuffiency (use after diagnosis is confirmed) Knowledge deficit related to disease process and management Goals: Patient will maintain optimal edema control Date Initiated: 06/26/2021 Target Resolution Date: 05/19/2022 Goal Status: Active Interventions: Assess peripheral edema status every visit. Compression as ordered Treatment Activities: Therapeutic compression applied : 06/26/2021 Notes: Wound/Skin Impairment Nursing Diagnoses: Impaired tissue  integrity Goals: Patient/caregiver will verbalize understanding of skin care regimen Date Initiated: 04/17/2021 Target Resolution Date: 05/19/2022 Goal Status: Active Ulcer/skin breakdown will have a volume reduction of 30% by week 4 Date Initiated: 04/17/2021 Date Inactivated: 05/29/2021 Target Resolution Date: 05/15/2021 Goal Status: Met Ulcer/skin breakdown will have a volume reduction of 50% by week 8 Date Initiated: 05/29/2021 Date Inactivated: 06/26/2021 Target Resolution Date: 06/26/2021 Goal Status: Unmet Unmet Reason: venous reflux Interventions: Assess patient/caregiver ability to obtain necessary supplies Assess patient/caregiver ability to perform ulcer/skin care regimen upon admission and as needed Assess ulceration(s) every visit [name], [name] ([id number]) [id number] Page 7 of 11 Provide education on ulcer and skin care Treatment Activities: Topical wound management initiated : 04/17/2021 Notes: 06/08/21: Left leg wounds greater than 30% volume reduction, right leg acute infection. Electronic Signature(s) Signed: 03/07/2022 5:03:48 PM By: [name] Creamer RN, BSN Entered By: [name] on 03/07/2022 10:38:43 -------------------------------------------------------------------------------- Pain Assessment Details Patient Name: Date of Service: [name], [name] 03/07/2022 10:15 A M Medical Record Number: [id number] Patient Account Number: [id number] Date of Birth/Sex: Treating RN: [birth year] (51 y.o. [name] Primary Care Johanna Matto: [name] Other Clinician: Referring Nahshon Reich: Treating Izzy Courville/Extender: [name] in Treatment: 46 Active Problems Location of Pain Severity and Description of Pain Patient Has Paino Yes Site Locations Rate the pain. Current Pain Level: 3 Pain Management and Medication Current Pain Management: Electronic Signature(s) Signed: 03/07/2022 5:03:48 PM By: [name] Creamer RN, BSN Entered By: [name] on 03/07/2022 10:25:14 -------------------------------------------------------------------------------- Patient/Caregiver Education Details Patient Name: Date of Service: [name], [name] 10/19/2023andnbsp10:15 [place] Record Number: [id number] Patient Account Number: [id number] Date of Birth/Gender: Treating RN: 02-04-1971 (51 y.o. [name] Primary Care Physician: [name] Other Clinician: Referring Physician: Treating Physician/Extender: Farrel Demark [facility], Legrand Rams ([id number]) [id number] Page 8 of 11 Weeks in Treatment: 64 Education Assessment Education Provided To: Patient Education Topics Provided Wound/Skin Impairment: Methods: Explain/Verbal Responses: State content correctly [employer]) Signed: 03/07/2022 5:03:48 PM By: [name] Creamer RN, BSN Entered By: [name] on 03/07/2022 10:38:57 -------------------------------------------------------------------------------- Wound Assessment Details Patient Name: Date of Service: [name], [name] 03/07/2022 10:15 A M Medical Record Number: [id number] Patient Account Number: [id number] Date of Birth/Sex: Treating RN: May 27, 1970 (51 y.o. [name] Primary Care Senta Kantor: [name] Other Clinician: Referring Wren Gallaga: Treating Reggie Bise/Extender: Farrel Demark Weeks in Treatment: 46 Wound Status Wound Number: 17 Primary Etiology: [name] Cell Lesion Wound Location: Right, Lateral Lower Leg Wound Status: Open Wounding Event: Gradually Appeared Comorbid History: Anemia, [name] Cell Disease, Neuropathy Date Acquired: 10/05/2012 Weeks Of Treatment: 46 Clustered Wound: Yes Photos Wound Measurements Length: (cm) 6.9 Width: (cm) 5.3 Depth: (cm) 0.1 Area: (cm) 28.722 Volume: (cm) 2.872 % Reduction in Area: 84.7% % Reduction in Volume: 84.7% Epithelialization: Small  (1-33%) Tunneling: No Undermining: No Wound Description Classification: Full Thickness Without Exposed Support Structures Wound Margin: Distinct, outline attached Exudate Amount: Large Exudate Type: Serosanguineous Exudate Color: red, brown Foul Odor After Cleansing: No Slough/Fibrino  Yes Wound Bed [name], [name] ([id number]) [id number] Page 9 of 11 Granulation Amount: Small (1-33%) Exposed Structure Granulation Quality: Pink, Pale Fascia Exposed: No Necrotic Amount: Large (67-100%) Fat Layer (Subcutaneous Tissue) Exposed: Yes Necrotic Quality: Adherent Slough Tendon Exposed: No Muscle Exposed: No Joint Exposed: No Bone Exposed: No Periwound Skin Texture Texture Color No Abnormalities Noted: Yes No Abnormalities Noted: Yes Moisture Temperature / Pain No Abnormalities Noted: Yes Temperature: No Abnormality Treatment Notes Wound #17 (Lower Leg) Wound Laterality: Right, Lateral Cleanser Soap and Water Discharge Instruction: May shower and wash wound with dial antibacterial soap and water prior to dressing change. Wound Cleanser Discharge Instruction: Cleanse the wound with wound cleanser prior to applying a clean dressing using gauze sponges, not tissue or cotton balls. Peri-Wound Care Triamcinolone 15 (g) Discharge Instruction: Use triamcinolone 15 (g) as directed Sween Lotion (Moisturizing lotion) Discharge Instruction: Apply moisturizing lotion as directed Topical Primary Dressing MediHoney Gel, tube 1.5 (oz) Discharge Instruction: Apply to wound bed as instructed Secondary Dressing ABD Pad, 5x9 Discharge Instruction: Apply over primary dressing as directed. Zetuvit Plus 4x8 in Discharge Instruction: Apply over primary dressing as directed. Secured With Elastic Bandage 4 inch (ACE bandage) Discharge Instruction: Secure with ACE bandage as directed. Compression Wrap Kerlix Roll 4.5x3.1 (in/yd) Discharge Instruction: Apply Kerlix and  Coban compression as directed. Compression Stockings Add-Ons Electronic Signature(s) Signed: 03/07/2022 5:03:48 PM By: [name] Creamer RN, BSN Entered By: [name] on 03/07/2022 10:34:21 -------------------------------------------------------------------------------- Wound Assessment Details Patient Name: Date of Service: [name], [name] 03/07/2022 10:15 A M Medical Record Number: [id number] Patient Account Number: [id number] Date of Birth/Sex: Treating RN: 1971-03-06 (51 y.o. [name] Primary Care Skilar Marcou: [name] Other Clinician: Referring Aliyanna Wassmer: Treating Dwayn Moravek/Extender: [name] in Treatment: [address], [place] ([id number]) [id number] Page 10 of 11 Wound Status Wound Number: 21 Primary Etiology: [name] Cell Lesion Wound Location: Right, Medial Ankle Secondary Etiology: Venous Leg Ulcer Wounding Event: Gradually Appeared Wound Status: Open Date Acquired: 06/26/2021 Comorbid History: Anemia, [name] Cell Disease, Neuropathy Weeks Of Treatment: 36 Clustered Wound: No Photos Wound Measurements Length: (cm) 7.5 Width: (cm) 3.3 Depth: (cm) 0.1 Area: (cm) 19.439 Volume: (cm) 1.944 % Reduction in Area: 32.9% % Reduction in Volume: 32.9% Epithelialization: Small (1-33%) Tunneling: No Undermining: No Wound Description Classification: Full Thickness Without Exposed Support Structures Wound Margin: Distinct, outline attached Exudate Amount: Medium Exudate Type: Serosanguineous Exudate Color: red, brown Foul Odor After Cleansing: No Slough/Fibrino Yes Wound Bed Granulation Amount: Small (1-33%) Exposed Structure Granulation Quality: Pink Fascia Exposed: No Necrotic Amount: Large (67-100%) Fat Layer (Subcutaneous Tissue) Exposed: Yes Necrotic Quality: Adherent Slough Tendon Exposed: No Muscle Exposed: No Joint Exposed: No Bone Exposed: No Periwound Skin Texture Texture Color No  Abnormalities Noted: Yes No Abnormalities Noted: Yes Moisture Temperature / Pain No Abnormalities Noted: Yes Temperature: No Abnormality Treatment Notes Wound #21 (Ankle) Wound Laterality: Right, Medial Cleanser Soap and Water Discharge Instruction: May shower and wash wound with dial antibacterial soap and water prior to dressing change. Wound Cleanser Discharge Instruction: Cleanse the wound with wound cleanser prior to applying a clean dressing using gauze sponges, not tissue or cotton balls. Peri-Wound Care Triamcinolone 15 (g) Discharge Instruction: Use triamcinolone 15 (g) as directed Sween Lotion (Moisturizing lotion) Discharge Instruction: Apply moisturizing lotion as directed Topical Primary Dressing MediHoney Gel, tube 1.5 (oz) [name] ([id number]) [id number] Page 11 of 11 Discharge Instruction: Apply to wound bed as instructed Secondary Dressing ABD Pad, 5x9 Discharge Instruction: Apply over primary dressing as  directed. Zetuvit Plus 4x8 in Discharge Instruction: Apply over primary dressing as directed. Secured With Elastic Bandage 4 inch (ACE bandage) Discharge Instruction: Secure with ACE bandage as directed. Compression Wrap Kerlix Roll 4.5x3.1 (in/yd) Discharge Instruction: Apply Kerlix and Coban compression as directed. Compression Stockings Add-Ons Electronic Signature(s) Signed: 03/07/2022 5:03:48 PM By: [name] Creamer RN, BSN Entered By: [name] on 03/07/2022 10:34:49 -------------------------------------------------------------------------------- [place] Details Patient Name: Date of Service: [name], [name] 03/07/2022 10:15 A M Medical Record Number: [id number] Patient Account Number: [id number] Date of Birth/Sex: Treating RN: 24-Jun-1970 (51 y.o. [name] Primary Care Yaa Donnellan: [name] Other Clinician: Referring Simranjit Thayer: Treating Gerhard Rappaport/Extender: Farrel Demark Weeks in  Treatment: 46 Vital Signs Time Taken: 10:24 Temperature (F): 98.2 Height (in): 67 Pulse (bpm): 96 Respiratory Rate (breaths/min): 18 Blood Pressure (mmHg): 152/77 Reference Range: 80 - 120 mg / dl Electronic Signature(s) Signed: 03/07/2022 5:03:48 PM By: [name] Creamer RN, BSN Entered By: [name] on 03/07/2022 10:25:03

## 2022-03-14 ENCOUNTER — Ambulatory Visit (HOSPITAL_BASED_OUTPATIENT_CLINIC_OR_DEPARTMENT_OTHER): Payer: Medicaid Other | Admitting: General Surgery

## 2022-03-19 ENCOUNTER — Ambulatory Visit: Payer: Medicaid Other | Admitting: Family Medicine

## 2022-03-19 ENCOUNTER — Encounter: Payer: Self-pay | Admitting: Family Medicine

## 2022-03-19 VITALS — BP 118/58 | HR 68 | Temp 97.7°F | Ht 67.0 in | Wt 129.0 lb

## 2022-03-19 DIAGNOSIS — D571 Sickle-cell disease without crisis: Secondary | ICD-10-CM | POA: Diagnosis not present

## 2022-03-19 DIAGNOSIS — G894 Chronic pain syndrome: Secondary | ICD-10-CM | POA: Diagnosis not present

## 2022-03-19 DIAGNOSIS — E559 Vitamin D deficiency, unspecified: Secondary | ICD-10-CM

## 2022-03-19 LAB — POCT URINALYSIS DIPSTICK
Glucose, UA: NEGATIVE
Ketones, UA: NEGATIVE
Leukocytes, UA: NEGATIVE
Nitrite, UA: NEGATIVE
Protein, UA: POSITIVE — AB
Spec Grav, UA: 1.015 (ref 1.010–1.025)
Urobilinogen, UA: 0.2 E.U./dL
pH, UA: 5.5 (ref 5.0–8.0)

## 2022-03-19 MED ORDER — OXYCODONE HCL 10 MG PO TABS: 10.0000 mg | ORAL_TABLET | ORAL | 0 refills | Status: DC

## 2022-03-20 ENCOUNTER — Other Ambulatory Visit: Payer: Self-pay | Admitting: Family Medicine

## 2022-03-20 ENCOUNTER — Observation Stay (HOSPITAL_COMMUNITY)
Admission: RE | Admit: 2022-03-20 | Discharge: 2022-03-20 | Disposition: A | Discharge status: Home / Self Care | Payer: Medicaid Other | Source: Ambulatory Visit | Attending: Internal Medicine | Admitting: Internal Medicine

## 2022-03-20 ENCOUNTER — Telehealth: Payer: Self-pay | Admitting: Family Medicine

## 2022-03-20 VITALS — BP 116/68 | HR 68 | Temp 98.9°F | Resp 16

## 2022-03-20 DIAGNOSIS — D571 Sickle-cell disease without crisis: Secondary | ICD-10-CM

## 2022-03-20 DIAGNOSIS — D638 Anemia in other chronic diseases classified elsewhere: Secondary | ICD-10-CM

## 2022-03-20 DIAGNOSIS — D649 Anemia, unspecified: Secondary | ICD-10-CM | POA: Diagnosis present

## 2022-03-20 DIAGNOSIS — J9601 Acute respiratory failure with hypoxia: Secondary | ICD-10-CM | POA: Diagnosis not present

## 2022-03-20 DIAGNOSIS — G894 Chronic pain syndrome: Secondary | ICD-10-CM | POA: Diagnosis present

## 2022-03-20 LAB — PREPARE RBC (CROSSMATCH)

## 2022-03-20 LAB — HEMOGLOBIN AND HEMATOCRIT, BLOOD
HCT: 12.9 % — ABNORMAL LOW (ref 36.0–46.0)
Hemoglobin: 4.6 g/dL — CL (ref 12.0–15.0)

## 2022-03-20 MED ORDER — DIPHENHYDRAMINE HCL 50 MG/ML IJ SOLN
25.0000 mg | Freq: Once | INTRAMUSCULAR | Status: AC
  Administered 2022-03-20: 25 mg via INTRAVENOUS
  Filled 2022-03-20: qty 1

## 2022-03-20 MED ORDER — SODIUM CHLORIDE 0.9% IV SOLUTION: Freq: Once | INTRAVENOUS | Status: AC

## 2022-03-20 MED ORDER — ACETAMINOPHEN 325 MG PO TABS
650.0000 mg | ORAL_TABLET | Freq: Once | ORAL | Status: AC
  Administered 2022-03-20: 650 mg via ORAL
  Filled 2022-03-20: qty 2

## 2022-03-20 NOTE — Telephone Encounter (Signed)
[name] is a 51 year old female with a medical history significant for sickle cell disease was evaluated in clinic on 03/19/2022 has a hemoglobin of 5.2. Will tranfuse 2 units of PRBCs on today.   [name]  APRN, MSN, FNP-C Patient [facility] [address] [place],  [address] [phone]

## 2022-03-20 NOTE — Progress Notes (Signed)
PATIENT CARE CENTER NOTE:  Provider: [place] Hollis FNP  Diagnosis: SCD; symptomatic anemia  Procedure:  2 units PRBC   Patient received  2 units of packed red blood cells via PIV. Type and screen  and H/H was done before transfusion. Consent for blood products valid. Pre transfusion medications Tylenol PO and Benadryl IV were given. Prior to transfusion, pt oxygen saturation was 88% on room air, provider made aware and pt placed on 2 L [place]. Pt tolerated blood transfusion without difficulty.  Post transfusion oxygen saturation 91-93%, pt stable on room air and after ambulation, pt has no complaints. [place] FNP made aware, instructed to discharge patient home and have her return to center in the morning to recheck labs and VS to determine if hospital admission is needed . Pt made aware, verbalized understanding. Patient declined AVS. Patient alert, oriented and ambulatory at the time of discharge.

## 2022-03-20 NOTE — H&P (Signed)
H&P  Patient Demographics:  [name], is a 51 y.o. female  MRN: [id number]   DOB - Mar 14, 1971  Admit Date - (Not on file)  Outpatient Primary MD for the patient is [name], FNP       HPI:   [name]  is a 51 y.o. female with a medical history significant for sickle cell disease type SS, chronic pain syndrome, opiate dependence and tolerance, and history of bilateral lower extremity foot ulcers who presented to sickle cell day infusion center with hemoglobin of 5.2 and some shortness of breath.  On 03/19/2022, patient was evaluated by PCP and was found to have a hemoglobin that was decreased below her baseline at 5.2.  Patient was advised to report to day clinic for transfusion of 2 units PRBCs.  Upon arrival, H&H was repeated and patient has continued to hemolyze overnight.  Current hemoglobin is 4.6 g/dL.  Patient also reports some fatigue.  She has some pain, but it is consistent with her typical chronic pain.  Patient last had oxycodone this a.m. with moderate relief. Patient denies headache, dizziness, blurred vision, chest pain, urinary symptoms, nausea, vomiting, or diarrhea.  She has not had any sick contacts, recent travel, or known exposure to COVID-19.  During day hospital admission, oxygen saturation was found to be 80-89% on room air.  Patient will be transition to [facility] long hospital in observation to continue to receive 2 units PRBCs   Review of systems:  In addition to the HPI above, patient reports No fever or chills No Headache, No changes with vision or hearing No problems swallowing food or liquids No chest pain, cough or shortness of breath No abdominal pain, No nausea or vomiting, Bowel movements are regular No blood in stool or urine No dysuria No new skin rashes or bruises No new joints pains-aches No new weakness, tingling, numbness in any extremity No recent weight gain or loss No polyuria, polydypsia or polyphagia No significant Mental  Stressors  A full 10 point Review of Systems was done, except as stated above, all other Review of Systems were negative.  With Past History of the following :   Past Medical History:  Diagnosis Date   Anemia 03/30/2012   Hx of sickle cell disease   CAP (community acquired pneumonia)    2014   Cholelithiasis 03/30/2012   Chronic wound of extremity 04/01/2012   Elevated LFTs    Leg ulcer ([place]) 10/27/2012   Chronic under care of wound clinic   Multiple open wounds of lower extremity    chronic wounds B/LLE   Sickle cell disease ([place])    Sinus bradycardia by electrocardiogram 04/03/2012      Past Surgical History:  Procedure Laterality Date   ALLOGRAFT APPLICATION Right 6/94/8546   Procedure: SURGICAL PREP FOR GRAFTING RIGHT LOWER EXTREMITY AND APPLICATION OF Jannifer Hick;  Surgeon: Irene Limbo, MD;  Location: [place];  Service: Plastics;  Laterality: Right;   APPLICATION OF A-CELL OF EXTREMITY Right 10/09/2015   Procedure: APPLICATION OF Jannifer Hick;  Surgeon: Irene Limbo, MD;  Location: [place];  Service: Plastics;  Laterality: Right;   BREAST SURGERY     left breast cyst aspiration   CESAREAN SECTION     CESAREAN SECTION N/A 01/06/2014   Procedure: CESAREAN SECTION;  Surgeon: Emily Filbert, MD;  Location: [facility];  Service: Obstetrics;  Laterality: N/A;   CESAREAN SECTION N/A 05/04/2017   Procedure: CESAREAN SECTION;  Surgeon: Woodroe Mode, MD;  Location: [place];  Service: Obstetrics;  Laterality: N/A;   I & D EXTREMITY Right 10/09/2015   Procedure: SURGICAL PREPARATION FOR GRAFTING RIGHT ANKLE AND APPLICATION THERASKIN;  Surgeon: Irene Limbo, MD;  Location: [place];  Service: Plastics;  Laterality: Right;   SKIN FULL THICKNESS GRAFT Bilateral 06/17/2016   Procedure: SURGICAL PREP FOR GRAFTING, BILATERAL LOWER EXTREMITIES AND APPLICATION OF Jannifer Hick;  Surgeon: Irene Limbo, MD;  Location: [facility];   Service: Plastics;  Laterality: Bilateral;   TONSILLECTOMY       Social History:   Social History   Tobacco Use   Smoking status: Never    Passive exposure: Never   Smokeless tobacco: Never  Substance Use Topics   Alcohol use: No     Lives - At home   Family History :   Family History  Problem Relation Age of Onset   Sickle cell anemia Sister    Diabetes Mother    Asthma Mother    Sickle cell trait Mother    Sickle cell trait Father    Hypertension Father      Home Medications:   Prior to Admission medications   Medication Sig Start Date End Date Taking? Authorizing Provider  aspirin EC 81 MG tablet Take 1 tablet (81 mg total) by mouth daily. 07/17/21   [name], FNP  Deferiprone 500 MG TABS Take 500 mg by mouth daily at 12 noon. Patient not taking: Reported on 07/25/2021 04/19/21   [name], FNP  folic acid (FOLVITE) 1 MG tablet Take 5 tablets (5 mg total) by mouth daily. Patient taking differently: Take 1 mg by mouth daily. 01/15/17   Anyanwu, Sallyanne Havers, MD  gabapentin (NEURONTIN) 300 MG capsule TAKE 1 CAPSULE(300 MG) BY MOUTH THREE TIMES DAILY Patient not taking: Reported on 03/19/2022 10/10/21   [name], FNP  ibuprofen (ADVIL) 600 MG tablet Take 1 tablet (600 mg total) by mouth every 6 (six) hours as needed for mild pain. 02/15/22   [name], FNP  Nutritional Supplements (JUVEN) POWD Take 1 each by mouth 3 (three) times daily between meals. Patient not taking: Reported on 09/11/2021 07/27/21   [name], FNP  Oxycodone HCl 10 MG TABS Take 1 tablet (10 mg total) by mouth every 4 (four) hours while awake. 03/19/22   [name], FNP  voxelotor (OXBRYTA) 500 MG TABS tablet Take 2 tablets (1,000 mg) by mouth daily. Patient not taking: Reported on 03/19/2022 12/18/21   [name], FNP     Allergies:   No Known Allergies   Physical Exam:   Vitals:   Vitals:   03/20/22 1537 03/20/22 1556  BP: (!) 106/56 (!) 113/59   Pulse: 68 68  Resp: 16 16  Temp: 98.4 F (36.9 C) 98.5 F (36.9 C)  SpO2: 93% 94%    Physical Exam: Constitutional: Patient appears well-developed and well-nourished. Not in obvious distress. HENT: Normocephalic, atraumatic, External right and left ear normal. Oropharynx is clear and moist.  Eyes: Conjunctivae and EOM are normal. PERRLA, no scleral icterus. Neck: Normal ROM. Neck supple. No JVD. No tracheal deviation. No thyromegaly. CVS: RRR, S1/S2 +, no murmurs, no gallops, no carotid bruit.  Pulmonary: Effort and breath sounds normal, no stridor, rhonchi, wheezes, rales.  Abdominal: Soft. BS +, no distension, tenderness, rebound or guarding.  Musculoskeletal: Normal range of motion. No edema and no tenderness.  Lymphadenopathy: No lymphadenopathy noted, cervical, inguinal or axillary Neuro: Alert. Normal reflexes, muscle  tone coordination. No cranial nerve deficit. Skin: Skin is warm and dry. No rash noted. Not diaphoretic. No erythema. No pallor. Psychiatric: Normal mood and affect. Behavior, judgment, thought content normal.   Data Review:   CBC Recent Labs  Lab 03/19/22 0944 03/20/22 0954  WBC 11.7*  --   HGB 5.2* 4.6*  HCT 15.4* 12.9*  PLT 396  --   MCV 86  --   MCH 28.9  --   MCHC 33.8  --   RDW 23.8*  --   LYMPHSABS 2.3  --   EOSABS 0.5*  --   BASOSABS 0.1  --    ------------------------------------------------------------------------------------------------------------------  Chemistries  Recent Labs  Lab 03/19/22 0944  NA 142  K 4.4  CL 108*  CO2 16*  GLUCOSE 84  BUN 6  CREATININE 0.53*  CALCIUM 9.5  AST 59*  ALT 20  ALKPHOS 65  BILITOT 4.4*   ------------------------------------------------------------------------------------------------------------------ estimated creatinine clearance is 77.7 mL/min (A) (by C-G formula based on SCr of 0.53 mg/dL  (L)). ------------------------------------------------------------------------------------------------------------------ No results for input(s): "TSH", "T4TOTAL", "T3FREE", "THYROIDAB" in the last 72 hours.  Invalid input(s): "FREET3"  Coagulation profile No results for input(s): "INR", "PROTIME" in the last 168 hours. ------------------------------------------------------------------------------------------------------------------- No results for input(s): "DDIMER" in the last 72 hours. -------------------------------------------------------------------------------------------------------------------  Cardiac Enzymes No results for input(s): "CKMB", "TROPONINI", "MYOGLOBIN" in the last 168 hours.  Invalid input(s): "CK" ------------------------------------------------------------------------------------------------------------------    Component Value Date/Time   BNP 48.3 08/15/2016 2007    ---------------------------------------------------------------------------------------------------------------  Urinalysis    Component Value Date/Time   COLORURINE YELLOW 04/07/2018 [place] 04/07/2018 1433   LABSPEC 1.012 04/07/2018 1433   PHURINE 5.0 04/07/2018 1433   GLUCOSEU NEGATIVE 04/07/2018 1433   HGBUR SMALL (A) 04/07/2018 1433   BILIRUBINUR small 03/19/2022 1200   KETONESUR negative 12/18/2021 1301   KETONESUR NEGATIVE 04/07/2018 1433   PROTEINUR Positive (A) 03/19/2022 1200   PROTEINUR 30 (A) 04/07/2018 1433   UROBILINOGEN 0.2 03/19/2022 1200   UROBILINOGEN 0.2 05/14/2017 1144   NITRITE neg 03/19/2022 1200   NITRITE NEGATIVE 04/07/2018 1433   LEUKOCYTESUR Negative 03/19/2022 1200    ----------------------------------------------------------------------------------------------------------------   Imaging Results:    No results found.   Assessment & Plan:  Principal Problem:   Symptomatic anemia Active Problems:   Chronic pain syndrome   Acute  respiratory failure with hypoxia (HCC)   Symptomatic anemia: Admit patient in observation.  Transfused 2 units PRBCs.  Follow CBC with differential in AM.  Maintain oxygen saturation above 90% on 2 L supplemental oxygen.  Sickle cell disease with pain: We will continue patient's home medication regimen of oxycodone 10 mg every 4 hours as needed Dilaudid 1 mg every 4 hours as needed for severe breakthrough pain Monitor vital signs very closely and reevaluate pain scale regularly. Patient will be reevaluated based on the context of function and relationship to baseline as care progresses.  Acute hypoxia: Initiate 2 L supplemental oxygen.  Maintain patient above 90%.  Titrate as tolerated.    DVT Prophylaxis: SCDs  AM Labs Ordered, also please review Full Orders  Family Communication: Admission, patient's condition and plan of care including tests being ordered have been discussed with the patient who indicate understanding and agree with the plan and Code Status.  Code Status: Full Code  Consults called: None    Admission status: Inpatient    Time spent in minutes : 30 minutes  [place], MSN, FNP-C [facility] [place], [place] [address] [phone]  03/20/2022 at 4:19 PM

## 2022-03-21 ENCOUNTER — Non-Acute Institutional Stay (HOSPITAL_COMMUNITY)
Admission: RE | Admit: 2022-03-21 | Discharge: 2022-03-21 | Disposition: A | Discharge status: Home / Self Care | Payer: Medicaid Other | Source: Ambulatory Visit | Attending: Internal Medicine | Admitting: Internal Medicine

## 2022-03-21 ENCOUNTER — Telehealth: Payer: Self-pay

## 2022-03-21 ENCOUNTER — Encounter (HOSPITAL_BASED_OUTPATIENT_CLINIC_OR_DEPARTMENT_OTHER): Payer: Medicaid Other | Attending: General Surgery | Admitting: General Surgery

## 2022-03-21 VITALS — BP 121/62 | HR 58 | Temp 98.7°F | Resp 16

## 2022-03-21 DIAGNOSIS — I82531 Chronic embolism and thrombosis of right popliteal vein: Secondary | ICD-10-CM | POA: Insufficient documentation

## 2022-03-21 DIAGNOSIS — D571 Sickle-cell disease without crisis: Secondary | ICD-10-CM | POA: Diagnosis not present

## 2022-03-21 DIAGNOSIS — L97818 Non-pressure chronic ulcer of other part of right lower leg with other specified severity: Secondary | ICD-10-CM | POA: Diagnosis not present

## 2022-03-21 DIAGNOSIS — D649 Anemia, unspecified: Secondary | ICD-10-CM | POA: Insufficient documentation

## 2022-03-21 LAB — CMP14+CBC/D/PLT+FER+RETIC+V...
ALT: 20 IU/L (ref 0–32)
AST: 59 IU/L — ABNORMAL HIGH (ref 0–40)
Albumin/Globulin Ratio: 1.6 (ref 1.2–2.2)
Albumin: 4.4 g/dL (ref 3.9–4.9)
Alkaline Phosphatase: 65 IU/L (ref 44–121)
BUN/Creatinine Ratio: 11 (ref 9–23)
BUN: 6 mg/dL (ref 6–24)
Basophils Absolute: 0.1 10*3/uL (ref 0.0–0.2)
Basos: 1 %
Bilirubin Total: 4.4 mg/dL — ABNORMAL HIGH (ref 0.0–1.2)
CO2: 16 mmol/L — ABNORMAL LOW (ref 20–29)
Calcium: 9.5 mg/dL (ref 8.7–10.2)
Chloride: 108 mmol/L — ABNORMAL HIGH (ref 96–106)
Creatinine, Ser: 0.53 mg/dL — ABNORMAL LOW (ref 0.57–1.00)
EOS (ABSOLUTE): 0.5 10*3/uL — ABNORMAL HIGH (ref 0.0–0.4)
Eos: 5 %
Ferritin: 1176 ng/mL — ABNORMAL HIGH (ref 15–150)
Globulin, Total: 2.8 g/dL (ref 1.5–4.5)
Glucose: 84 mg/dL (ref 70–99)
Hematocrit: 15.4 % — CL (ref 34.0–46.6)
Hemoglobin: 5.2 g/dL — CL (ref 11.1–15.9)
Immature Grans (Abs): 0.1 10*3/uL (ref 0.0–0.1)
Immature Granulocytes: 1 %
Lymphocytes Absolute: 2.3 10*3/uL (ref 0.7–3.1)
Lymphs: 20 %
MCH: 28.9 pg (ref 26.6–33.0)
MCHC: 33.8 g/dL (ref 31.5–35.7)
MCV: 86 fL (ref 79–97)
Monocytes Absolute: 1.5 10*3/uL — ABNORMAL HIGH (ref 0.1–0.9)
Monocytes: 12 %
NRBC: 20 % — ABNORMAL HIGH (ref 0–0)
Neutrophils Absolute: 7.3 10*3/uL — ABNORMAL HIGH (ref 1.4–7.0)
Neutrophils: 61 %
Platelets: 396 10*3/uL (ref 150–450)
Potassium: 4.4 mmol/L (ref 3.5–5.2)
RBC: 1.8 x10E6/uL — CL (ref 3.77–5.28)
RDW: 23.8 % — ABNORMAL HIGH (ref 11.7–15.4)
Retic Ct Pct: 20.8 % — ABNORMAL HIGH (ref 0.6–2.6)
Sodium: 142 mmol/L (ref 134–144)
Total Protein: 7.2 g/dL (ref 6.0–8.5)
Vit D, 25-Hydroxy: 26.1 ng/mL — ABNORMAL LOW (ref 30.0–100.0)
WBC: 11.7 10*3/uL — ABNORMAL HIGH (ref 3.4–10.8)
eGFR: 113 mL/min/{1.73_m2} (ref >59)

## 2022-03-21 LAB — BPAM RBC
Blood Product Expiration Date: 202311152359
Blood Product Expiration Date: 202312062359
ISSUE DATE / TIME: 202311011318
ISSUE DATE / TIME: 202311011528
Unit Type and Rh: 5100
Unit Type and Rh: 5100

## 2022-03-21 LAB — CBC WITH DIFFERENTIAL/PLATELET
Abs Immature Granulocytes: 0.06 10*3/uL (ref 0.00–0.07)
Basophils Absolute: 0.1 10*3/uL (ref 0.0–0.1)
Basophils Relative: 2 %
Eosinophils Absolute: 0.6 10*3/uL — ABNORMAL HIGH (ref 0.0–0.5)
Eosinophils Relative: 6 %
HCT: 21.3 % — ABNORMAL LOW (ref 36.0–46.0)
Hemoglobin: 7.4 g/dL — ABNORMAL LOW (ref 12.0–15.0)
Immature Granulocytes: 1 %
Lymphocytes Relative: 17 %
Lymphs Abs: 1.5 10*3/uL (ref 0.7–4.0)
MCH: 28 pg (ref 26.0–34.0)
MCHC: 34.7 g/dL (ref 30.0–36.0)
MCV: 80.7 fL (ref 80.0–100.0)
Monocytes Absolute: 0.9 10*3/uL (ref 0.1–1.0)
Monocytes Relative: 10 %
Neutro Abs: 5.8 10*3/uL (ref 1.7–7.7)
Neutrophils Relative %: 64 %
Platelets: 361 10*3/uL (ref 150–400)
RBC: 2.64 MIL/uL — ABNORMAL LOW (ref 3.87–5.11)
RDW: 32.8 % — ABNORMAL HIGH (ref 11.5–15.5)
WBC: 9 10*3/uL (ref 4.0–10.5)
nRBC: 27.3 % — ABNORMAL HIGH (ref 0.0–0.2)

## 2022-03-21 LAB — COMPREHENSIVE METABOLIC PANEL
ALT: 26 U/L (ref 0–44)
AST: 55 U/L — ABNORMAL HIGH (ref 15–41)
Albumin: 4 g/dL (ref 3.5–5.0)
Alkaline Phosphatase: 51 U/L (ref 38–126)
Anion gap: 6 (ref 5–15)
BUN: 6 mg/dL (ref 6–20)
CO2: 21 mmol/L — ABNORMAL LOW (ref 22–32)
Calcium: 9 mg/dL (ref 8.9–10.3)
Chloride: 114 mmol/L — ABNORMAL HIGH (ref 98–111)
Creatinine, Ser: 0.38 mg/dL — ABNORMAL LOW (ref 0.44–1.00)
GFR, Estimated: >60 mL/min (ref >60)
Glucose, Bld: 85 mg/dL (ref 70–99)
Potassium: 3.8 mmol/L (ref 3.5–5.1)
Sodium: 141 mmol/L (ref 135–145)
Total Bilirubin: 4.4 mg/dL — ABNORMAL HIGH (ref 0.3–1.2)
Total Protein: 7.7 g/dL (ref 6.5–8.1)

## 2022-03-21 LAB — RETICULOCYTES
Immature Retic Fract: 46.5 % — ABNORMAL HIGH (ref 2.3–15.9)
RBC.: 2.63 MIL/uL — ABNORMAL LOW (ref 3.87–5.11)
Retic Count, Absolute: 387.5 10*3/uL — ABNORMAL HIGH (ref 19.0–186.0)
Retic Ct Pct: 14.9 % — ABNORMAL HIGH (ref 0.4–3.1)

## 2022-03-21 LAB — TYPE AND SCREEN
ABO/RH(D): B POS
Antibody Screen: NEGATIVE
Unit division: 0
Unit division: 0

## 2022-03-21 LAB — LACTATE DEHYDROGENASE: LDH: 550 U/L — ABNORMAL HIGH (ref 98–192)

## 2022-03-21 NOTE — Progress Notes (Addendum)
PATIENT CARE CENTER NOTE   Diagnosis: Symptomatic anemia [D64.9]     Provider: Hollis, [place], FNP   Procedure: Lab draw (CBC w/diff, LDH, CMP, Retic)   Note: Patient's labs drawn peripherally by phlebotomist and sent to [facility] lab. Patient tolerated well. Vital signs wnl. Patient notified of CBC results prior to discharge. CMP recollected due to hemolysis.  Patient alert, oriented and ambulatory with cane at discharge.

## 2022-03-21 NOTE — Progress Notes (Addendum)
[name], [name] ([id number]) [id number] Page 1 of 14 Visit Report for 03/21/2022 Chief Complaint Document Details Patient Name: Date of Service: [name] [name] 03/21/2022 10:15 A M Medical Record Number: [id number] Patient Account Number: [id number] Date of Birth/Sex: Treating RN: 08-Apr-1971 (51 y.o. F) Primary Care Provider: [name] Other Clinician: Referring Provider: Treating Provider/Extender: Elyse Jarvis Weeks in Treatment: 69 Information Obtained from: Patient Chief Complaint the patient is here for evaluation of her bilateral lower extremity sickle cell ulcers 04/17/2021; patient comes in for substantial wounds on the right and left lower leg Electronic Signature(s) Signed: 03/21/2022 10:24:58 AM By: Fredirick Maudlin MD FACS Entered By: Fredirick Maudlin on 03/21/2022 10:24:58 -------------------------------------------------------------------------------- HPI Details Patient Name: Date of Service: [name], [name] 03/21/2022 10:15 A M Medical Record Number: [id number] Patient Account Number: [id number] Date of Birth/Sex: Treating RN: 04-15-1971 (51 y.o. F) Primary Care Provider: [name] Other Clinician: Referring Provider: Treating Provider/Extender: Elyse Jarvis Weeks in Treatment: 14 History of Present Illness Location: medial and lateral ankle region on the right and left medial malleolus Quality: Patient reports experiencing a shooting pain to affected area(s). Severity: Patient states wound(s) are getting worse. Duration: right lower extremity bimalleolar ulcers have been present for approximately 2 years; the rright meedial malleolus ulcer has been there proximally 6 months Timing: Pain in wound is constant (hurts all the time) Context: The wound would happen gradually ssociated Signs and Symptoms: Patient reports having increase discharge. A HPI Description: 51 year old patient  with a history of sickle cell anemia who was last seen by me with ulceration of the right lower extremity above the ankle and was referred to Dr. Leland Johns for a surgical debridement as I was unable to do anything in the office due to excruciating pain. At that stage she was referred from the plastic surgery service to dermatology who treated her for a skin infection with doxycycline and then Levaquin and a local antibiotic ointment. I understand the patient has since developed ulceration on the left ankle both medial and lateral and was now referred back to the wound center as dermatology has finished the management. I do not have any notes from the dermatology department Old notes: 51 year old patient with a history of sickle cell anemia, pain bilateral lower extremities, right lower extremity ulcer and has a history of receiving a skin graft( Theraskin) several months ago. She has been visiting the wound center [facility] and was seen by Dr. Dellia Nims and Dr. Leland Johns. after prolonged conservator therapy between July 2016 and January 2017. She had been seen by the plastic surgeon and taken to the OR for debridement and application of Theraskin. She had 3 applications of Theraskin and was then treated with collagen. Prior to that she had a history of similar problems in 2014 and was treated conservatively. Had a reflux study done for the right lower extremity in August 2016 without reflux or DVT . Past medical history significant for sickle cell disease, anemia, leg ulcers, cholelithiasis,and has never been a smoker. Once the patient was discharged on the wound center she says within 2 or 3 weeks the problems recurred and she has been treating it conservatively. since I saw her 3 weeks ago at [facility] she has been unable to get her dressing material but has completed a course of doxycycline. 6/7/ 2017 -- lower extremity venous duplex reflux evaluation was done No evidence of SVT or DVT in the RLL. No  venous incompetence in the RLL. No further  vascular workup is indicated at this time. She was seen by Dr. [name], on 10/04/2015. She agreed with the plan of taking her to the OR for debridement and application of theraskin and would also take biopsies to rule out pyoderma gangrenosum. [name], [name] ([id number]) [id number] Page 2 of 14 Follow-up note dated May 31 received and she was status post application of Theraskin to multiple ulcers around the right ankle. Pathology did not show evidence of malignancy or pyoderma gangrenosum. She would continue to see as in the wound clinic for further care and see Dr. Leland Johns as needed. The patient brought the biopsy report and it was consistent with stasis ulcer no evidence of malignancy and the comment was that there was some adjacent neovascularization, fibrosis and patchy perivascular chronic inflammation. 11/15/2015 -- today we applied her first application of Theraskin 11/30/15; TheraSkin #2 12/13/2015 -- she is having a lot of pain locally and is here for possible application of a theraskin today. 01/16/2016 -- the patient has significant pain and has noticed despite in spite of all local care and oral pain medication. It is impossible to debride her in the office. 02/06/2016 -- I do not see any notes from Dr. Iran Planas( the patient has not made a call to the office know as she heard from them) and the only visit to recently was with her PCP Dr. Danella Penton -- I saw her on 01/16/2016 and prescribed 90 tablets of oxycodone 10 mg and did lab work and screening for HIV. the HIV was negative and hemoglobin was 6.3 with a WBC count of 14.9 and hematocrit of 17.8 with platelets of 561. 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 The bimalleolar ulcers to the right lower extremity have been present for  approximately 2 years, the left medial malleolus ulcer has been present approximately 6 months. She has followed with Dr.Thimmappa in the past and has had a total of 3 applications of Theraskin (01/2015, 09/2015, 06/17/16). She has also followed with Dr. Con Memos here in the clinic and has received 2 applications of TheraSkin (11/10/15, 11/30/15). The patient does experience chronic, and is not amenable to debridement. 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 She is not currently on any antibiotic therapy and has not been treated with any recently. 07/17/2016 -- was seen by Dr. Iran Planas of plastic surgery who saw her 2 weeks postop application of Theraskin #3. She had removed her dressing and asked her to apply silver alginate on alternate days and follow-up back with the wound center. Future debridements and application of skin substitute would have to be done in the hospital due to her high risk for anesthesia. READMISSION 04/17/2021 Patient is now a 51 year old woman that we have had in this clinic for a prolonged period of time and 2016-2017 and then again for 2 visits in February 2018. At that point she had wounds on the right lower leg predominantly medial. She had also been seen by plastic surgery Dr. Leland Johns who I believe took her to the OR for operative debridement and application of TheraSkin in 2017. After she left our clinic she was followed for a very prolonged period of time in the wound care center in [facility] who then referred her ultimately to [facility] where she was seen by Dr. Vernona Rieger. Again taken her to the OR for skin grafting which apparently did  not take. She had multiple other attempts at dressings although I have not really looked over all of these notes in great detail. She has not been seen in a wound care center in about a year. She states over the last year in addition to her right lower leg she has developed wounds on the left lower leg  quite extensive. She is using Xeroform to all of these wounds without really any improvement. She also has Medicaid which does not cover wound products. The patient has had vascular work-ups in the past including most recently on 03/28/2021 showing biphasic waveforms on the right triphasic at the PTA and biphasic at the dorsalis pedis on the left. She was unable to tolerate any degree of compression to do ABIs. Unfortunately TBI's were also not done. She had venous reflux studies done in 2017. This did not show any evidence of a DVT or SVT and no venous incompetence was noted in the right leg at the time this was the only side with the wound As noted I did not look all over her old records. She apparently had a course of HBO and [facility] although I am not sure what the indication would have been. In any case she developed seizures and terminated treatment earlier. She is generally much more disabled than when we last saw her in clinic. She can no longer walk pretty much wheelchair-bound because predominantly of pain in the left hip. 04/24/2021; the patient tolerated the wraps we put on. We used Santyl and Hydrofera Blue under compression. I brought her back for a nurse visit for a change in dressing. With Medicaid we will have a hard time getting anything paid for and hence the need for compression. She arrives in clinic with all the wounds looking somewhat better in terms of surface 12/20; circumferential wound on the right from the lateral to the medial. She has open areas on the left medial and left lateral x2 on all of this with the same surface. This does not look completely healthy although she does have some epithelialization. She is not complaining of a lot of pain which is unusual for her sickle ulcers. I have not looked over her extensive records from [facility]. She had recent arterial studies and has a history of venous reflux studies I will need to look these over although I do not believe she  has significant arterial disease 2023 05/22/2021; patient's wound areas measure slightly smaller. Still a lot of drainage coming from the right we have been using Hydrofera Blue and Santyl with some improvement in the wound surfaces. She tells me she will be getting transfused later in the week for her underlying sickle cell anemia I have looked over her recent arterial studies which were done in the fall. This was in November and showed biphasic and triphasic waveforms but she could not tolerate ABIs because of pressure and unfortunately TBI's were not done. She has not had recent venous reflux studies that I can see 1/10; not much change about the same surface area. This has a yellowish surface to it very gritty. We have been using Santyl and Hydrofera Blue for a prolonged period. Culture I did last week showed methicillin sensitive staph aureus "rare". Our intake nurse reports greenish drainage which may be the Hydrofera Blue itself 1/17; wounds are continue to measure smaller although I am not sure about the accuracy here. Especially the areas on the right are covered in what looks to be a nonviable surface although she  does have some epithelialization. Similarly she has areas on the left medial and left lateral ankle area which appear to have a better surface and perhaps are slightly smaller. We have been using Santyl and Hydrofera Blue. She cannot tolerate mechanical debridements She went for her reflux studies which showed significant reflux at the greater saphenous vein at the saphenofemoral junction as well as the greater saphenous vein in the proximal calf on the left she had reflux in the thigh and the common femoral vein and supra vein Fishel vein reflux in the greater saphenous vein. I will have vein and vascular look at this. My thoughts have been that these are likely sickle wounds. I looked through her old records from [facility] wound care center and then when she graduated to [facility] wound care center where she saw Dr. Zigmund Daniel and Dr. Vernona Rieger. Although I can see she had reflux studies done I do not see that she actually saw a vein and vascular. I went over the fact that she had operative debridements and actual skin grafting that did not take. I do not think these wounds have ever really progressed towards healing 1/31;Substantial wounds on the right ankle area. Hyper granulated very gritty adherent debris on the surface. 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 On the left she has no DVT or SVT deep vein reflux in the common femoral vein. Superficial vein reflux in the greater saphenous vein on although the vein diameter is not really all that large. I do not think there is anything that can be done with these although I am going to send her for consultation to vein and vascular. 2/7; Wound exam; substantial wound area on the right posterior ankle area and areas on the left medial ankle and left lateral ankle. I was able to debride the left medial ankle last week fairly aggressively and it is back this week to a completely nonviable surface She will see vascular surgery this Friday and I would like them to review the venous studies and also any comments on her arterial status. If they do not see an issue here I am going to refer her to plastic surgery for an operative debridement perhaps intraoperative ACell or Integra. Eventually she will require a deep REALITY, DEJONGE ([id number]) [id number] Page 3 of 14 tissue culture again 2/14; substantial wound area on the right posterior  ankle, medial ankle. We have been using silver alginate The patient was seen by vein and vascular she had both venous reflux studies and arterial studies. 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 She had no evidence of superficial venous thrombosis. She did have superficial vein reflux at the saphenofemoral junction and the greater saphenous vein. On the left no evidence of a DVT no evidence of superficial venous throat thrombosis she did have deep vein reflux in the common femoral vein and superficial vein reflux in the greater saphenous vein but these were not felt to be amenable to ablation. In terms of arterial studies she had triphasic and wife  biphasic wave waveforms bilaterally not felt to have a significant arterial issue. I do not get the feeling that they felt that any part of her nonhealing wounds were related to either arterial or venous issues. They did note that she had venous reflux at the right at the [facility] and [facility]. And also on the left there were reflux in the deep system at the common femoral vein and greater saphenous vein in the proximal thigh. Nothing amenable to ablation. 2/20; she is making some decent progress on the right where there is nice skin between the 2 open areas on the right ankle. The surfaces here do not look viable yet there is some surrounding epithelialization. She still has a small area on the left medial ankle area. Hyper-granulated Jody's away always 2/28 patient has an appointment with plastic surgery on 3/8. We will see her back on 3/9. She may have to call us to get the area redressed. We've been using Santyl under silver alginate. We made a nice improvement on the left medial ankle. The larger wounds on the right also looks somewhat better in terms of epithelialization although I think they could benefit from an aggressive debridement if plastic surgery would be willing to do that.  Perhaps placement of Integra or a cell 07/26/2021: She saw Dr. Claudia Desanctis yesterday. He raised the question as to whether or not this might be pyoderma and wanted to wait until that question was answered by dermatology before proceeding with any sort of operative debridement. We have continue to use Santyl under silver alginate with Kerlix and Coban wraps. Overall, her wounds appear to be continuing to contract and epithelialize, with some granulation tissue present. There continues to be some slough on all wound surfaces. 08/09/2021: She has not been able to get an appointment with dermatology because apparently the offices in [place] do not accept Medicaid. She is looking into whether or not she can be seen at the main [facility] dermatology clinic. This is necessary because plastic surgery is concerned that her wounds might represent pyoderma and they did not want to do any procedure until that was clarified. We have been using Santyl under silver alginate with Kerlix and Coban wraps. Today, there was a greater amount of drainage on her dressings with a slight green discoloration and significant odor. Despite this, her wounds continue to contract and epithelialize. There is pale granulation tissue present and actually, on the left medial ankle, the granulation tissue is a bit hypertrophic. 08/16/2021: Last week, I took a culture and this grew back rare methicillin-resistant Staph aureus and rare corynebacterium. The MRSA was sensitive to gentamicin which we began applying topically on an empiric basis. This week, her wounds are a bit smaller and the drainage and odor are less. Her primary care provider is working on assisting the patient with a dermatology evaluation. She has been in silver alginate over the gentamicin that was started last week along with Kerlix and Coban wraps. 08/23/2021: Because she has Medicaid, we have been unable to get her into see any dermatologist in the  [place] to rule out pyoderma gangrenosum, which was a requirement from plastic surgery prior to any sort of debridement and grafting. Despite this, however, all of her wounds continue to get smaller. The wound on her left medial ankle is nearly closed. There is no odor from the wounds, although she still accumulates a modest amount of drainage on her dressings. 08/30/2021: The lateral right ankle wound and the  medial left ankle wound are a bit smaller today. The medial right ankle wound is about the same size. They are less tender. We have still been unable to get her into dermatology. 09/06/2021: All of the wounds are about the same size today. She continues to endorse minimal pain. I communicated with Dr. Claudia Desanctis in plastic surgery regarding our issues getting a dermatology appointment; he was out of town but indicated that he would look into perhaps performing the biopsy in his office and will have his office contact her. 09/14/2021: The patient has an appointment in dermatology, but it is not until October. Her wounds are roughly the same; she continues to have very thick purulent-looking drainage on her dressings. 09/20/2021: The left medial wound is nearly closed and just has a bit of accumulated eschar on the surface. The right medial and lateral ankle wounds are perhaps a little bit smaller. They continue to have a very pale surface with accumulation of thin slough. 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 I did not think [employer] was indicated based on this. She is getting topical mupirocin with Prisma silver collagen. 10/04/2021: The patient was not seen in clinic last week due to childcare coverage issues. In the interim, the left medial leg wound has closed. The right sided leg wounds are smaller. There is more granulation tissue coming through, particularly on the lateral wound. The surface remains somewhat gritty. We have been applying topical mupirocin and Prisma silver  collagen. 10/11/2021: The left medial leg wound remains closed. She does complain of some anesthetic sensation to the area. Both of the right-sided leg wounds are smaller but still have accumulated slough. 10/18/2021: Both right-sided leg wounds are minimally smaller this week. She still continues to accumulate slough and has thick drainage on her dressings. 10/23/2021: Both wounds continue to contract. There is still slough buildup. She has been approved for a keratin-based skin substitute trial product but it will not be available until next week. 10/30/2021: The wounds are about the same to perhaps slightly smaller. There is still continued slough buildup. Unfortunately, the rep for the keratin based product did not show up today and did not answer his phone when called. 11/08/2021: The wounds are little bit smaller today. She continues to have thick drainage but the surfaces are relatively clean with just a little bit of slough accumulation. She reported to me today that she is unable to completely flex her left ankle and on examination it seems this is potentially related to scar tissue from her wounds. We do have the ProgenaMatrix trial product available for her today. 11/15/2021: Both wounds are smaller today. There is some slough accumulation on the surfaces, but the medial wound, in particular looks like it is filling in and is less deep. She did hear from physical therapy and she is going to start working with them on July 11. She is here for her second application of the trial skin substitute, ProgenaMatrix. 11/22/2021: Both wounds continue to contract, the medial more dramatically than the lateral. Both wounds have a layer of slough on the surface, but underneath this, the gritty fibrous tissue has a little bit more of a pink cast to it rather than being as pale as it has been. 11/29/2021: The wounds are roughly the same size this week, perhaps a millimeter or 2 smaller. The medial wound has filled  in and is nearly flush with the surrounding skin surface. She continues to have a lot of  slough accumulation on both surfaces. 12/06/2021: No significant change to her wounds, but she has a new opening on her dorsal foot, just distal to the right lateral ankle wound. The area on her left medial ankle that reopened looks a little bit larger today. She has quite a bit of pain associated with the new wound. 12/12/2021: Her wounds look about the same but the new opening on her right lateral dorsal foot is a little bit bigger. She continues to have a fair amount of pain with this wound. 12/26/2021: The left medial ankle wound is tiny and superficial. She has 2 areas of crusting on her left lateral ankle, however, that appear to be threatening to [name], [name] ([id number]) [id number] Page 4 of 14 open again. Her right medial ankle wound is a little bit smaller today but still continues to accumulate thick rubbery slough. The new dorsal foot wound is exquisitely painful but there is no odor or purulent drainage. No erythema or induration. The right lateral ankle wound looks about the same today, again with thick rubbery slough. 01/03/2022: The left medial ankle wound has closed again. Both right ankle wounds appear to be about the same size with thick rubbery slough. The dorsal foot wound on the right continues to be quite painful and she stated that she did not want any debridement of that site today. 01/10/2022: No real change to any of her wounds. She continues to accumulate thick slough. The dorsal foot wound has merged with the lateral malleolar wound. She is experiencing significant pain in the dorsal foot portion of the ulcer. 01/16/2022: Absolutely no change or progress in her wounds. 01/24/2022: Her wounds are unchanged. She continues to build up slough and the wounds on her dorsal right foot are still exquisitely tender. 01/31/2022: The wounds actually measure a little bit  narrower today. They still have thick slough on the surface but the underlying tissue seems a little less fibrotic. We changed to Iodosorb last week. 02/07/2022: Wounds continue to slowly epithelialize around the parameters. She has less pain today. She still accumulates a fairly substantial layer of slough. 02/15/2022: No change to the wounds overall. The measured a little bit larger today per the intake nurse. She continues to have substantial slough accumulation and her pain is a little bit worse. 02/21/2022. No change at all to her wounds. I do not see that plastic surgery has received her referral yet. 02/28/2022: The wounds remain unchanged. They are dry and fibrotic with accumulation of slough and eschar. She did receive an appointment to see plastic surgery on October 23, but the office called her back and indicated they needed to reschedule and that the next available appointment was not until December. The patient became angry and decided she did not want to see this plastics group. 10/19; the patient sees Dr. Lovett Calender again next week. She is using Medihoney as the primary dressing changing this herself. 03/21/2022: She saw Dr. Jaclynn Guarneri at the wound care center at [facility]. Dr. Jaclynn Guarneri was also unable to debride her, secondary to pain and is planning to take her to the operating room for operative debridement and potential skin substitute placement. Her wounds are unchanged with thick yellow slough and a fibrotic base. She says they are too painful to be debrided. In addition, yesterday her hemoglobin was 4 and she received 2 units packed red blood cell transfusion. Electronic Signature(s) Signed: 03/21/2022 10:26:23 AM By: Fredirick Maudlin MD FACS Entered By: Fredirick Maudlin on 03/21/2022 10:26:23 --------------------------------------------------------------------------------  Physical Exam Details Patient Name: Date of Service: [name] [name] 03/21/2022 10:15 A  M Medical Record Number: [id number] Patient Account Number: [id number] Date of Birth/Sex: Treating RN: 06/09/1970 (51 y.o. F) Primary Care Provider: [name] Other Clinician: Referring Provider: Treating Provider/Extender: Elyse Jarvis Weeks in Treatment: 48 Constitutional . . . . No acute distress.Marland Kitchen Respiratory Normal work of breathing on room air.. Notes 03/21/2022: No change to her wounds. They are fibrotic with a thick layer of slough and eschar covering the surface. Exquisitely painful. Electronic Signature(s) Signed: 03/21/2022 10:28:15 AM By: Fredirick Maudlin MD FACS Entered By: Fredirick Maudlin on 03/21/2022 10:28:15 -------------------------------------------------------------------------------- Physician Orders Details Patient Name: Date of Service: [name], [name] 03/21/2022 10:15 A Gardiner Sleeper ([id number]) [id number] Page 5 of 14 Medical Record Number: [id number] Patient Account Number: [id number] Date of Birth/Sex: Treating RN: [birth year] (51 y.o. [name] Primary Care Provider: [name] Other Clinician: Referring Provider: Treating Provider/Extender: Elyse Jarvis Weeks in Treatment: 58 Verbal / Phone Orders: No Diagnosis Coding ICD-10 Coding Code Description L97.818 Non-pressure chronic ulcer of other part of right lower leg with other specified severity 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 Follow-up Appointments ppointment in 2 weeks. - Dr. Celine Ahr Room 3 Return A Anesthetic (In clinic) Topical Lidocaine 5% applied to wound bed - Used in clinic Bathing/ Shower/ Hygiene May shower with protection but do not get wound dressing(s) wet. - Can get cast protector bags at [facility] or [facility] Edema Control - Lymphedema / SCD / Other Elevate legs to the level of the heart or above for 30 minutes daily and/or when sitting, a frequency of: - throughout the day Avoid standing  for long periods of time. Exercise regularly Additional Orders / Instructions Follow Nutritious Diet Wound Treatment Wound #17 - Lower Leg Wound Laterality: Right, Lateral Cleanser: Soap and Water Every Other Day/30 Days Discharge Instructions: May shower and wash wound with dial antibacterial soap and water prior to dressing change. Cleanser: Wound Cleanser Every Other Day/30 Days Discharge Instructions: Cleanse the wound with wound cleanser prior to applying a clean dressing using gauze sponges, not tissue or cotton balls. Peri-Wound Care: Triamcinolone 15 (g) Every Other Day/30 Days Discharge Instructions: Use triamcinolone 15 (g) as directed Peri-Wound Care: Sween Lotion (Moisturizing lotion) Every Other Day/30 Days Discharge Instructions: Apply moisturizing lotion as directed Prim Dressing: MediHoney Gel, tube 1.5 (oz) Every Other Day/30 Days ary Discharge Instructions: Apply to wound bed as instructed Secondary Dressing: ABD Pad, 5x9 Every Other Day/30 Days Discharge Instructions: Apply over primary dressing as directed. Secondary Dressing: Zetuvit Plus 4x8 in Every Other Day/30 Days Discharge Instructions: Apply over primary dressing as directed. Secured With: Elastic Bandage 4 inch (ACE bandage) Every Other Day/30 Days Discharge Instructions: Secure with ACE bandage as directed. Compression Wrap: Kerlix Roll 4.5x3.1 (in/yd) Every Other Day/30 Days Discharge Instructions: Apply Kerlix and Coban compression as directed. Wound #21 - Ankle Wound Laterality: Right, Medial Cleanser: Soap and Water Every Other Day/30 Days Discharge Instructions: May shower and wash wound with dial antibacterial soap and water prior to dressing change. Cleanser: Wound Cleanser Every Other Day/30 Days Discharge Instructions: Cleanse the wound with wound cleanser prior to applying a clean dressing using gauze sponges, not tissue or cotton balls. Peri-Wound Care: Triamcinolone 15 (g) Every Other Day/30  Days Discharge Instructions: Use triamcinolone 15 (g) as directed Peri-Wound Care: Sween Lotion (Moisturizing lotion) Every Other Day/30 Days Discharge Instructions: Apply moisturizing lotion as directed Prim Dressing: MediHoney Gel,  tube 1.5 (oz) Every Other Day/30 Days ary Discharge Instructions: Apply to wound bed as instructed [name], [name] ([id number]) [id number] Page 6 of 14 Secondary Dressing: ABD Pad, 5x9 Every Other Day/30 Days Discharge Instructions: Apply over primary dressing as directed. Secondary Dressing: Zetuvit Plus 4x8 in Every Other Day/30 Days Discharge Instructions: Apply over primary dressing as directed. Secured With: Elastic Bandage 4 inch (ACE bandage) Every Other Day/30 Days Discharge Instructions: Secure with ACE bandage as directed. Compression Wrap: Kerlix Roll 4.5x3.1 (in/yd) Every Other Day/30 Days Discharge Instructions: Apply Kerlix and Coban compression as directed. Electronic Signature(s) Signed: 03/21/2022 10:58:08 AM By: Fredirick Maudlin MD FACS Entered By: Fredirick Maudlin on 03/21/2022 10:28:25 -------------------------------------------------------------------------------- Problem List Details Patient Name: Date of Service: [name], [name] 03/21/2022 10:15 A M Medical Record Number: [id number] Patient Account Number: [id number] Date of Birth/Sex: Treating RN: 1971/04/28 (51 y.o. F) Primary Care Provider: [name] Other Clinician: Referring Provider: Treating Provider/Extender: Elyse Jarvis Weeks in Treatment: 62 Active Problems ICD-10 Encounter Code Description Active Date MDM Diagnosis L97.818 Non-pressure chronic ulcer of other part of right lower leg with other specified 04/17/2021 No Yes severity 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 04/17/2021 No Yes Inactive Problems ICD-10 Code Description Active Date Inactive Date L97.828 Non-pressure chronic ulcer of other part of left  lower leg with other specified severity 04/17/2021 04/17/2021 Resolved Problems Electronic Signature(s) Signed: 03/21/2022 10:24:46 AM By: Fredirick Maudlin MD FACS Entered By: Fredirick Maudlin on 03/21/2022 10:24:46 Progress Note Details -------------------------------------------------------------------------------- [name] ([id number]) [id number] Page 7 of 14 Patient Name: Date of Service: [name],  [name] 03/21/2022 10:15 A M Medical Record Number: [id number] Patient Account Number: [id number] Date of Birth/Sex: Treating RN: 09-Oct-1970 (51 y.o. F) Primary Care Provider: [name] Other Clinician: Referring Provider: Treating Provider/Extender: Elyse Jarvis Weeks in Treatment: 56 Subjective Chief Complaint Information obtained from Patient the patient is here for evaluation of her bilateral lower extremity sickle cell ulcers 04/17/2021; patient comes in for substantial wounds on the right and left lower leg History of Present Illness (HPI) The following HPI elements were documented for the patient's wound: Location: medial and lateral ankle region on the right and left medial malleolus Quality: Patient reports experiencing a shooting pain to affected area(s). Severity: Patient states wound(s) are getting worse. Duration: right lower extremity bimalleolar ulcers have been present for approximately 2 years; the rright meedial malleolus ulcer has been there proximally 6 months Timing: Pain in wound is constant (hurts all the time) Context: The wound would happen gradually Associated Signs and Symptoms: Patient reports having increase discharge. 51 year old patient with a history of sickle cell anemia who was last seen by me with ulceration of the right lower extremity above the ankle and was referred to Dr. Leland Johns for a surgical debridement as I was unable to do anything in the office due to excruciating pain. At that  stage she was referred from the plastic surgery service to dermatology who treated her for a skin infection with doxycycline and then Levaquin and a local antibiotic ointment. I understand the patient has since developed ulceration on the left ankle both medial and lateral and was now referred back to the wound center as dermatology has finished the management. I do not have any notes from the dermatology department Old notes: 51 year old patient with a history of sickle cell anemia, pain bilateral lower extremities, right lower extremity ulcer and has a history of receiving a skin graft( Theraskin) several months ago. She has been  visiting the wound center [facility] and was seen by Dr. Dellia Nims and Dr. Leland Johns. after prolonged conservator therapy between July 2016 and January 2017. She had been seen by the plastic surgeon and taken to the OR for debridement and application of Theraskin. She had 3 applications of Theraskin and was then treated with collagen. Prior to that she had a history of similar problems in 2014 and was treated conservatively. Had a reflux study done for the right lower extremity in August 2016 without reflux or DVT . Past medical history significant for sickle cell disease, anemia, leg ulcers, cholelithiasis,and has never been a smoker. Once the patient was discharged on the wound center she says within 2 or 3 weeks the problems recurred and she has been treating it conservatively. since I saw her 3 weeks ago at [facility] she has been unable to get her dressing material but has completed a course of doxycycline. 6/7/ 2017 -- lower extremity venous duplex reflux evaluation was done oo No evidence of SVT or DVT in the RLL. No venous incompetence in the RLL. No further vascular workup is indicated at this time. She was seen by Dr. [name], on 10/04/2015. She agreed with the plan of taking her to the OR for debridement and application of theraskin and would also take  biopsies to rule out pyoderma gangrenosum. Follow-up note dated May 31 received and she was status post application of Theraskin to multiple ulcers around the right ankle. Pathology did not show evidence of malignancy or pyoderma gangrenosum. She would continue to see as in the wound clinic for further care and see Dr. Leland Johns as needed. The patient brought the biopsy report and it was consistent with stasis ulcer no evidence of malignancy and the comment was that there was some adjacent neovascularization, fibrosis and patchy perivascular chronic inflammation. 11/15/2015 -- today we applied her first application of Theraskin 11/30/15; TheraSkin #2 12/13/2015 -- she is having a lot of pain locally and is here for possible application of a theraskin today. 01/16/2016 -- the patient has significant pain and has noticed despite in spite of all local care and oral pain medication. It is impossible to debride her in the office. 02/06/2016 -- I do not see any notes from Dr. Iran Planas( the patient has not made a call to the office know as she heard from them) and the only visit to recently was with her PCP Dr. Danella Penton -- I saw her on 01/16/2016 and prescribed 90 tablets of oxycodone 10 mg and did lab work and screening for HIV. the HIV was negative and hemoglobin was 6.3 with a WBC count of 14.9 and hematocrit of 17.8 with platelets of 561. 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 The bimalleolar ulcers to the right lower extremity have been present for approximately 2 years, the left medial malleolus ulcer has been present approximately 6 months. She has followed with Dr.Thimmappa in the past and has had a total of 3 applications of Theraskin (01/2015, 09/2015, 06/17/16). She has also followed with Dr. Con Memos here in the clinic and has received 2 applications of TheraSkin (11/10/15,  11/30/15). The patient does experience chronic, and is not amenable to debridement. 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 She is not currently on any antibiotic therapy and has not been treated with any recently. 07/17/2016 -- was seen by  Dr. Iran Planas of plastic surgery who saw her 2 weeks postop application of Theraskin #3. She had removed her dressing and asked her to apply silver alginate on alternate days and follow-up back with the wound center. Future debridements and application of skin substitute would have to be done in the hospital due to her high risk for anesthesia. READMISSION 04/17/2021 Patient is now a 51 year old woman that we have had in this clinic for a prolonged period of time and 2016-2017 and then again for 2 visits in February 2018. At that point she had wounds on the right lower leg predominantly medial. She had also been seen by plastic surgery Dr. Leland Johns who I believe took her to the OR for operative debridement and application of TheraSkin in 2017. After she left our clinic she was followed for a very prolonged period of time in the wound care center in [facility] who then referred her ultimately to [facility] where she was seen by Dr. Vernona Rieger. Again taken her to the OR for skin grafting which apparently did not take. She had multiple other attempts at dressings although I have not really looked over all of these notes in great detail. She has not been seen in a wound care center in about a year. She states over the last year in addition to her right lower leg she has developed wounds on the left lower leg quite extensive. She is using Xeroform to all of these wounds without really any improvement. She also has Medicaid which does not cover wound products. The patient has had vascular work-ups in the past including most recently on 03/28/2021 showing biphasic waveforms on the right triphasic at the PTA and biphasic at the dorsalis  pedis on the left. She was unable to tolerate any degree of compression to do ABIs. Unfortunately TBI's were also not done. She had venous reflux studies done in 2017. This did not show any evidence of a DVT or SVT and no venous incompetence was noted in the right leg at the time this was the only side with the wound As noted I did not look all over her old records. She apparently had a course of HBO and [facility] although I am not sure what the indication would have been. In any case she developed seizures and terminated treatment earlier. She is generally much more disabled than when we last saw her in clinic. She can no longer walk pretty much wheelchair-bound because predominantly of pain in the left hip. [name], [name] ([id number]) [id number] Page 8 of 14 04/24/2021; the patient tolerated the wraps we put on. We used Santyl and Hydrofera Blue under compression. I brought her back for a nurse visit for a change in dressing. With Medicaid we will have a hard time getting anything paid for and hence the need for compression. She arrives in clinic with all the wounds looking somewhat better in terms of surface 12/20; circumferential wound on the right from the lateral to the medial. She has open areas on the left medial and left lateral x2 on all of this with the same surface. This does not look completely healthy although she does have some epithelialization. She is not complaining of a lot of pain which is unusual for her sickle ulcers. I have not looked over her extensive records from [facility]. She had recent arterial studies and has a history of venous reflux studies I will need to look these over although I do not believe she has significant arterial disease 2023 05/22/2021;  patient's wound areas measure slightly smaller. Still a lot of drainage coming from the right we have been using Hydrofera Blue and Santyl with some improvement in the wound surfaces. She tells me  she will be getting transfused later in the week for her underlying sickle cell anemia I have looked over her recent arterial studies which were done in the fall. This was in November and showed biphasic and triphasic waveforms but she could not tolerate ABIs because of pressure and unfortunately TBI's were not done. She has not had recent venous reflux studies that I can see 1/10; not much change about the same surface area. This has a yellowish surface to it very gritty. We have been using Santyl and Hydrofera Blue for a prolonged period. Culture I did last week showed methicillin sensitive staph aureus "rare". Our intake nurse reports greenish drainage which may be the Hydrofera Blue itself 1/17; wounds are continue to measure smaller although I am not sure about the accuracy here. Especially the areas on the right are covered in what looks to be a nonviable surface although she does have some epithelialization. Similarly she has areas on the left medial and left lateral ankle area which appear to have a better surface and perhaps are slightly smaller. We have been using Santyl and Hydrofera Blue. She cannot tolerate mechanical debridements She went for her reflux studies which showed significant reflux at the greater saphenous vein at the saphenofemoral junction as well as the greater saphenous vein in the proximal calf on the left she had reflux in the thigh and the common femoral vein and supra vein Fishel vein reflux in the greater saphenous vein. I will have vein and vascular look at this. My thoughts have been that these are likely sickle wounds. I looked through her old records from [facility] wound care center and then when she graduated to [facility] wound care center where she saw Dr. Zigmund Daniel and Dr. Vernona Rieger. Although I can see she had reflux studies done I do not see that she actually saw a vein and vascular. I went over the fact that she had operative debridements and actual skin  grafting that did not take. I do not think these wounds have ever really progressed towards healing 1/31;Substantial wounds on the right ankle area. Hyper granulated very gritty adherent debris on the surface. 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 On the left she has no DVT or SVT deep vein reflux in the common femoral vein. Superficial vein reflux in the greater saphenous vein on although the vein diameter is not really all that large. I do not think there is anything that can be done with these although I am going to send her for consultation to vein and vascular. 2/7; Wound exam; substantial wound area on the right posterior ankle area and areas on the left medial ankle and left lateral ankle. I was able to debride the left medial ankle last week fairly aggressively and it is back this week to a completely nonviable surface She will see vascular surgery this Friday and I would like them to review the venous  studies and also any comments on her arterial status. If they do not see an issue here I am going to refer her to plastic surgery for an operative debridement perhaps intraoperative ACell or Integra. Eventually she will require a deep tissue culture again 2/14; substantial wound area on the right posterior ankle, medial ankle. We have been using silver alginate The patient was seen by vein and vascular she had both venous reflux studies and arterial studies. 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 She had no evidence of superficial venous thrombosis. She did  have superficial vein reflux at the saphenofemoral junction and the greater saphenous vein. On the left no evidence of a DVT no evidence of superficial venous throat thrombosis she did have deep vein reflux in the common femoral vein and superficial vein reflux in the greater saphenous vein but these were not felt to be amenable to ablation. In terms of arterial studies she had triphasic and wife biphasic wave waveforms bilaterally not felt to have a significant arterial issue. I do not get the feeling that they felt that any part of her nonhealing wounds were related to either arterial or venous issues. They did note that she had venous reflux at the right at the [facility] and [facility]. And also on the left there were reflux in the deep system at the common femoral vein and greater saphenous vein in the proximal thigh. Nothing amenable to ablation. 2/20; she is making some decent progress on the right where there is nice skin between the 2 open areas on the right ankle. The surfaces here do not look viable yet there is some surrounding epithelialization. She still has a small area on the left medial ankle area. Hyper-granulated Jody's away always 2/28 patient has an appointment with plastic surgery on 3/8. We will see her back on 3/9. She may have to call us to get the area redressed. We've been using Santyl under silver alginate. We made a nice improvement on the left medial ankle. The larger wounds on the right also looks somewhat better in terms of epithelialization although I think they could benefit from an aggressive debridement if plastic surgery would be willing to do that. Perhaps placement of Integra or a cell 07/26/2021: She saw Dr. Claudia Desanctis yesterday. He raised the question as to whether or not this might be pyoderma and wanted to wait until that question was answered by dermatology before proceeding with any sort of operative debridement. We have continue to use Santyl under silver alginate with Kerlix  and Coban wraps. Overall, her wounds appear to be continuing to contract and epithelialize, with some granulation tissue present. There continues to be some slough on all wound surfaces. 08/09/2021: She has not been able to get an appointment with dermatology because apparently the offices in [place] do not accept Medicaid. She is looking into whether or not she can be seen at the main [facility] dermatology clinic. This is necessary because plastic surgery is concerned that her wounds might represent pyoderma and they did not want to do any procedure until that was clarified. We have been using Santyl under silver alginate with Kerlix and Coban wraps. Today, there was a greater amount of drainage on her dressings with a slight green discoloration and significant odor. Despite this, her wounds continue to contract and epithelialize. There is pale granulation tissue present and actually, on the  left medial ankle, the granulation tissue is a bit hypertrophic. 08/16/2021: Last week, I took a culture and this grew back rare methicillin-resistant Staph aureus and rare corynebacterium. The MRSA was sensitive to gentamicin which we began applying topically on an empiric basis. This week, her wounds are a bit smaller and the drainage and odor are less. Her primary care provider is working on assisting the patient with a dermatology evaluation. She has been in silver alginate over the gentamicin that was started last week along with Kerlix and Coban wraps. 08/23/2021: Because she has Medicaid, we have been unable to get her into see any dermatologist in the [place] to rule out pyoderma gangrenosum, which was a requirement from plastic surgery prior to any sort of debridement and grafting. Despite this, however, all of her wounds continue to get smaller. The wound on her left medial ankle is nearly closed. There is no odor from the wounds, although she still accumulates a modest amount of  drainage on her dressings. 08/30/2021: The lateral right ankle wound and the medial left ankle wound are a bit smaller today. The medial right ankle wound is about the same size. They are [name], [name] ([id number]) [id number] Page 9 of 14 less tender. We have still been unable to get her into dermatology. 09/06/2021: All of the wounds are about the same size today. She continues to endorse minimal pain. I communicated with Dr. Claudia Desanctis in plastic surgery regarding our issues getting a dermatology appointment; he was out of town but indicated that he would look into perhaps performing the biopsy in his office and will have his office contact her. 09/14/2021: The patient has an appointment in dermatology, but it is not until October. Her wounds are roughly the same; she continues to have very thick purulent-looking drainage on her dressings. 09/20/2021: The left medial wound is nearly closed and just has a bit of accumulated eschar on the surface. The right medial and lateral ankle wounds are perhaps a little bit smaller. They continue to have a very pale surface with accumulation of thin slough. 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 I did not think [employer] was indicated based on this. She is getting topical mupirocin with Prisma silver collagen. 10/04/2021: The patient was not seen in clinic last week due to childcare coverage issues. In the interim, the left medial leg wound has closed. The right sided leg wounds are smaller. There is more granulation tissue coming through, particularly on the lateral wound. The surface remains somewhat gritty. We have been applying topical mupirocin and Prisma silver collagen. 10/11/2021: The left medial leg wound remains closed. She does complain of some anesthetic sensation to the area. Both of the right-sided leg wounds are smaller but still have accumulated slough. 10/18/2021: Both right-sided leg wounds are  minimally smaller this week. She still continues to accumulate slough and has thick drainage on her dressings. 10/23/2021: Both wounds continue to contract. There is still slough buildup. She has been approved for a keratin-based skin substitute trial product but it will not be available until next week. 10/30/2021: The wounds are about the same to perhaps slightly smaller. There is still continued slough buildup. Unfortunately, the rep for the keratin based product did not show up today and did not answer his phone when called. 11/08/2021: The wounds are little bit smaller today. She continues to have thick drainage but the surfaces are relatively clean with just a little bit of slough accumulation. She reported to  me today that she is unable to completely flex her left ankle and on examination it seems this is potentially related to scar tissue from her wounds. We do have the ProgenaMatrix trial product available for her today. 11/15/2021: Both wounds are smaller today. There is some slough accumulation on the surfaces, but the medial wound, in particular looks like it is filling in and is less deep. She did hear from physical therapy and she is going to start working with them on July 11. She is here for her second application of the trial skin substitute, ProgenaMatrix. 11/22/2021: Both wounds continue to contract, the medial more dramatically than the lateral. Both wounds have a layer of slough on the surface, but underneath this, the gritty fibrous tissue has a little bit more of a pink cast to it rather than being as pale as it has been. 11/29/2021: The wounds are roughly the same size this week, perhaps a millimeter or 2 smaller. The medial wound has filled in and is nearly flush with the surrounding skin surface. She continues to have a lot of slough accumulation on both surfaces. 12/06/2021: No significant change to her wounds, but she has a new opening on her dorsal foot, just distal to the right  lateral ankle wound. The area on her left medial ankle that reopened looks a little bit larger today. She has quite a bit of pain associated with the new wound. 12/12/2021: Her wounds look about the same but the new opening on her right lateral dorsal foot is a little bit bigger. She continues to have a fair amount of pain with this wound. 12/26/2021: The left medial ankle wound is tiny and superficial. She has 2 areas of crusting on her left lateral ankle, however, that appear to be threatening to open again. Her right medial ankle wound is a little bit smaller today but still continues to accumulate thick rubbery slough. The new dorsal foot wound is exquisitely painful but there is no odor or purulent drainage. No erythema or induration. The right lateral ankle wound looks about the same today, again with thick rubbery slough. 01/03/2022: The left medial ankle wound has closed again. Both right ankle wounds appear to be about the same size with thick rubbery slough. The dorsal foot wound on the right continues to be quite painful and she stated that she did not want any debridement of that site today. 01/10/2022: No real change to any of her wounds. She continues to accumulate thick slough. The dorsal foot wound has merged with the lateral malleolar wound. She is experiencing significant pain in the dorsal foot portion of the ulcer. 01/16/2022: Absolutely no change or progress in her wounds. 01/24/2022: Her wounds are unchanged. She continues to build up slough and the wounds on her dorsal right foot are still exquisitely tender. 01/31/2022: The wounds actually measure a little bit narrower today. They still have thick slough on the surface but the underlying tissue seems a little less fibrotic. We changed to Iodosorb last week. 02/07/2022: Wounds continue to slowly epithelialize around the parameters. She has less pain today. She still accumulates a fairly substantial layer of slough. 02/15/2022: No change  to the wounds overall. The measured a little bit larger today per the intake nurse. She continues to have substantial slough accumulation and her pain is a little bit worse. 02/21/2022. No change at all to her wounds. I do not see that plastic surgery has received her referral yet. 02/28/2022: The wounds remain unchanged. They  are dry and fibrotic with accumulation of slough and eschar. She did receive an appointment to see plastic surgery on October 23, but the office called her back and indicated they needed to reschedule and that the next available appointment was not until December. The patient became angry and decided she did not want to see this plastics group. 10/19; the patient sees Dr. Lovett Calender again next week. She is using Medihoney as the primary dressing changing this herself. 03/21/2022: She saw Dr. Jaclynn Guarneri at the wound care center at [facility]. Dr. Jaclynn Guarneri was also unable to debride her, secondary to pain and is planning to take her to the operating room for operative debridement and potential skin substitute placement. Her wounds are unchanged with thick yellow slough and a fibrotic base. She says they are too painful to be debrided. In addition, yesterday her hemoglobin was 4 and she received 2 units packed red blood cell transfusion. Patient History Information obtained from Patient. Family History Diabetes - Mother, Lung Disease - Mother, No family history of Cancer, Heart Disease, Hereditary Spherocytosis, Hypertension, Kidney Disease, Seizures, Stroke, Thyroid Problems, Tuberculosis. [name], [name] ([id number]) [id number] Page 10 of 14 Social History Never smoker, Marital Status - Married, Alcohol Use - Never, Drug Use - No History, Caffeine Use - Daily. Medical History Eyes Denies history of Cataracts, Glaucoma, Optic Neuritis Ear/Nose/Mouth/Throat Denies history of Chronic sinus problems/congestion, Middle ear  problems Hematologic/Lymphatic Patient has history of Anemia, Sickle Cell Disease Denies history of Hemophilia, Human Immunodeficiency Virus, Lymphedema Respiratory Denies history of Aspiration, Asthma, Chronic Obstructive Pulmonary Disease (COPD), Pneumothorax, Sleep Apnea, Tuberculosis Cardiovascular Denies history of Angina, Arrhythmia, Congestive Heart Failure, Coronary Artery Disease, Deep Vein Thrombosis, Hypertension, Hypotension, Myocardial Infarction, Peripheral Arterial Disease, Peripheral Venous Disease, Phlebitis, Vasculitis Gastrointestinal Denies history of Cirrhosis , Colitis, Crohnoos, Hepatitis A, Hepatitis B, Hepatitis C Endocrine Denies history of Type I Diabetes, Type II Diabetes Genitourinary Denies history of End Stage Renal Disease Immunological Denies history of Lupus Erythematosus, Raynaudoos, Scleroderma Integumentary (Skin) Denies history of History of Burn Musculoskeletal Denies history of Gout, Rheumatoid Arthritis, Osteoarthritis, Osteomyelitis Neurologic Patient has history of Neuropathy - right foot intermittant Denies history of Dementia, Quadriplegia, Paraplegia, Seizure Disorder Oncologic Denies history of Received Chemotherapy, Received Radiation Psychiatric Denies history of Anorexia/bulimia, Confinement Anxiety Hospitalization/Surgery History - c section x2. - left breast lumpectomy. - iandD right ankle with theraskin. Medical A Surgical History Notes nd Constitutional Symptoms (General Health) H/O miscarriage Cardiovascular bradycardia Gastrointestinal cholilithiasis Objective Constitutional No acute distress.. Vitals Time Taken: 10:05 AM, Height: 67 in, Temperature: 97.9 F, Pulse: 69 bpm, Respiratory Rate: 16 breaths/min, Blood Pressure: 136/72 mmHg. Respiratory Normal work of breathing on room air.. General Notes: 03/21/2022: No change to her wounds. They are fibrotic with a thick layer of slough and eschar covering the surface.  Exquisitely painful. Integumentary (Hair, Skin) Wound #17 status is Open. Original cause of wound was Gradually Appeared. The date acquired was: 10/05/2012. The wound has been in treatment 48 weeks. The wound is located on the Right,Lateral Lower Leg. The wound measures 7cm length x 4.8cm width x 0.1cm depth; 26.389cm^2 area and 2.639cm^3 volume. There is Fat Layer (Subcutaneous Tissue) exposed. There is no tunneling or undermining noted. There is a large amount of serosanguineous drainage noted. The wound margin is distinct with the outline attached to the wound base. There is small (1-33%) pink, pale granulation within the wound bed. There is a large (67-100%) amount of necrotic tissue within the wound bed including Adherent  Slough. The periwound skin appearance had no abnormalities noted for texture. The periwound skin appearance had no abnormalities noted for moisture. The periwound skin appearance had no abnormalities noted for color. Periwound temperature was noted as No Abnormality. Wound #21 status is Open. Original cause of wound was Gradually Appeared. The date acquired was: 06/26/2021. The wound has been in treatment 38 weeks. The wound is located on the Right,Medial Ankle. The wound measures 7.2cm length x 3.2cm width x 0.1cm depth; 18.096cm^2 area and 1.81cm^3 volume. There is Fat Layer (Subcutaneous Tissue) exposed. There is no tunneling or undermining noted. There is a medium amount of serosanguineous drainage noted. The wound margin is distinct with the outline attached to the wound base. There is small (1-33%) pink granulation within the wound bed. There is a large (67- 100%) amount of necrotic tissue within the wound bed including Adherent Slough. The periwound skin appearance had no abnormalities noted for texture. The periwound skin appearance had no abnormalities noted for moisture. The periwound skin appearance had no abnormalities noted for color. Periwound temperature was noted as  No Abnormality. 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 - Dr. Celine Ahr Room 3 Anesthetic: (In clinic) Topical Lidocaine 5% applied to wound bed - Used in clinic Bathing/ Shower/ Hygiene: May shower with protection but do not get wound dressing(s) wet. - Can get cast protector bags at [facility] or [facility] Edema Control - Lymphedema / SCD / Other: Elevate legs to the level of the heart or above for 30 minutes daily and/or when sitting, a frequency of: - throughout the day Avoid standing for long periods of time. Exercise regularly Additional Orders / Instructions: Follow Nutritious Diet WOUND #17: - Lower Leg Wound Laterality: Right, Lateral Cleanser: Soap and Water Every Other Day/30 Days Discharge Instructions: May shower and wash wound with dial antibacterial soap and water prior to dressing change. Cleanser: Wound Cleanser Every Other Day/30 Days Discharge Instructions: Cleanse the wound with wound cleanser prior to applying a clean dressing using gauze sponges, not tissue or cotton balls. Peri-Wound Care: Triamcinolone 15 (g) Every Other Day/30 Days Discharge Instructions: Use triamcinolone 15 (g) as directed Peri-Wound Care: Sween Lotion (Moisturizing lotion) Every Other Day/30 Days Discharge Instructions: Apply moisturizing lotion as directed Prim Dressing: MediHoney Gel, tube 1.5 (oz) Every Other Day/30 Days ary Discharge Instructions: Apply to wound bed as instructed Secondary Dressing: ABD Pad, 5x9 Every Other Day/30 Days Discharge Instructions: Apply over primary dressing as directed. Secondary Dressing: Zetuvit Plus 4x8 in Every Other Day/30 Days Discharge Instructions: Apply over primary dressing as directed. Secured  With: Elastic Bandage 4 inch (ACE bandage) Every Other Day/30 Days Discharge Instructions: Secure with ACE bandage as directed. Com pression Wrap: Kerlix Roll 4.5x3.1 (in/yd) Every Other Day/30 Days Discharge Instructions: Apply Kerlix and Coban compression as directed. WOUND #21: - Ankle Wound Laterality: Right, Medial Cleanser: Soap and Water Every Other Day/30 Days Discharge Instructions: May shower and wash wound with dial antibacterial soap and water prior to dressing change. Cleanser: Wound Cleanser Every Other Day/30 Days Discharge Instructions: Cleanse the wound with wound cleanser prior to applying a clean dressing using gauze sponges, not tissue or cotton balls. Peri-Wound Care: Triamcinolone 15 (g) Every Other Day/30 Days Discharge Instructions: Use triamcinolone 15 (g) as directed Peri-Wound Care: Sween Lotion (Moisturizing lotion) Every Other Day/30 Days Discharge  Instructions: Apply moisturizing lotion as directed Prim Dressing: MediHoney Gel, tube 1.5 (oz) Every Other Day/30 Days ary Discharge Instructions: Apply to wound bed as instructed Secondary Dressing: ABD Pad, 5x9 Every Other Day/30 Days Discharge Instructions: Apply over primary dressing as directed. Secondary Dressing: Zetuvit Plus 4x8 in Every Other Day/30 Days Discharge Instructions: Apply over primary dressing as directed. Secured With: Elastic Bandage 4 inch (ACE bandage) Every Other Day/30 Days Discharge Instructions: Secure with ACE bandage as directed. Com pression Wrap: Kerlix Roll 4.5x3.1 (in/yd) Every Other Day/30 Days Discharge Instructions: Apply Kerlix and Coban compression as directed. 03/21/2022: No change to her wounds. They are fibrotic with a thick layer of slough and eschar covering the surface. Exquisitely painful. She would not permit debridement today. She has not received an OR date yet from [facility]. Dr. Jacqualine Mau suggested she continue coming here while she awaits an OR date. For now we will  just continue to apply the Medihoney. As she is not permitted debridement over the past few visits, we will stretch her visits out to every 2 weeks. We will continue to monitor for any signs of infection at these visits. Electronic Signature(s) Signed: 03/21/2022 10:29:18 AM By: Fredirick Maudlin MD FACS Entered By: Fredirick Maudlin on 03/21/2022 10:29:17 [name] ([id number]) [id number] Page 12 of 14 -------------------------------------------------------------------------------- HxROS Details Patient Name: Date of Service: [name] [name] 03/21/2022 10:15 A M Medical Record Number: [id number] Patient Account Number: [id number] Date of Birth/Sex: Treating RN: [birth year] (51 y.o. F) Primary Care Provider: [name] Other Clinician: Referring Provider: Treating Provider/Extender: Elyse Jarvis Weeks in Treatment: 73 Information Obtained From Patient Constitutional Symptoms (General Health) Medical History: Past Medical History Notes: H/O miscarriage Eyes Medical History: Negative for: Cataracts; Glaucoma; Optic Neuritis Ear/Nose/Mouth/Throat Medical History: Negative for: Chronic sinus problems/congestion; Middle ear problems Hematologic/Lymphatic Medical History: Positive for: Anemia; Sickle Cell Disease Negative for: Hemophilia; Human Immunodeficiency Virus; Lymphedema Respiratory Medical History: Negative for: Aspiration; Asthma; Chronic Obstructive Pulmonary Disease (COPD); Pneumothorax; Sleep Apnea; Tuberculosis Cardiovascular Medical History: Negative for: Angina; Arrhythmia; Congestive Heart Failure; Coronary Artery Disease; Deep Vein Thrombosis; Hypertension; Hypotension; Myocardial Infarction; Peripheral Arterial Disease; Peripheral Venous Disease; Phlebitis; Vasculitis Past Medical History Notes: bradycardia Gastrointestinal Medical History: Negative for: Cirrhosis ; Colitis; Crohns; Hepatitis A; Hepatitis  B; Hepatitis C Past Medical History Notes: cholilithiasis Endocrine Medical History: Negative for: Type I Diabetes; Type II Diabetes Genitourinary Medical History: Negative for: End Stage Renal Disease Immunological Medical History: Negative for: Lupus Erythematosus; Raynauds; Scleroderma Integumentary (Skin) Medical History: Negative for: History of Burn [name], [name] ([id number]) [id number] Page 13 of 14 Musculoskeletal Medical History: Negative for: Gout; Rheumatoid Arthritis; Osteoarthritis; Osteomyelitis Neurologic Medical History: Positive for: Neuropathy - right foot intermittant Negative for: Dementia; Quadriplegia; Paraplegia; Seizure Disorder Oncologic Medical History: Negative for: Received Chemotherapy; Received Radiation Psychiatric Medical History: Negative for: Anorexia/bulimia; Confinement Anxiety Immunizations Pneumococcal Vaccine: Received Pneumococcal Vaccination: No Implantable Devices None Hospitalization / Surgery History Type of Hospitalization/Surgery c section x2 left breast lumpectomy iandD right ankle with theraskin Family and Social History Cancer: No; Diabetes: Yes - Mother; Heart Disease: No; Hereditary Spherocytosis: No; Hypertension: No; Kidney Disease: No; Lung Disease: Yes - Mother; Seizures: No; Stroke: No; Thyroid Problems: No; Tuberculosis: No; Never smoker; Marital Status - Married; Alcohol Use: Never; Drug Use: No History; Caffeine Use: Daily; Financial Concerns: No; Food, Clothing or Shelter Needs: No; Support System Lacking: No; Transportation Concerns: No Electronic Signature(s) Signed: 03/21/2022 10:58:08 AM By: Fredirick Maudlin MD FACS Entered By: Fredirick Maudlin on 03/21/2022  10:27:22 -------------------------------------------------------------------------------- SuperBill Details Patient Name: Date of Service: [name] Virgel Manifold [name] 03/21/2022 Medical Record Number: [id number] Patient Account  Number: [id number] Date of Birth/Sex: Treating RN: 1970/08/25 (51 y.o. 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 Facility Procedures : [name] FOJTIK Code: [id number] , Shantel ([id number]) Description: 99214 - WOUND CARE VISIT-LEV 4 EST PT [phone] Modifier: [id number] Quantity: 1 7.pdf Page 14 of 14 Physician Procedures : CPT4 Code Description Modifier [id number] - WC PHYS LEVEL 3 - EST PT ICD-10 Diagnosis Description L97.818 Non-pressure chronic ulcer of other part of right lower leg with other specified severity 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 Quantity: 1 Electronic Signature(s) Signed: 03/21/2022 12:51:27 PM By: Fredirick Maudlin MD FACS Signed: 03/21/2022 12:52:17 PM By: Dellie Catholic RN Previous Signature: 03/21/2022 10:30:41 AM Version By: Fredirick Maudlin MD FACS Entered By: Dellie Catholic on 03/21/2022 12:47:26

## 2022-03-21 NOTE — Progress Notes (Signed)
[name], [name] ([id number]) [id number] Page 1 of 10 Visit Report for 03/21/2022 Arrival Information Details Patient Name: Date of Service: [name] [name] 03/21/2022 10:15 A M Medical Record Number: [id number] Patient Account Number: [id number] Date of Birth/Sex: Treating RN: [birth year] (51 y.o. [name] Primary Care Ahley Bulls: [name] Other Clinician: Referring Terry Bolotin: Treating Birda Didonato/Extender: Elyse Jarvis Weeks in Treatment: 5 Visit Information History Since Last Visit Added or deleted any medications: No Patient Arrived: [name] Any new allergies or adverse reactions: No Arrival Time: 10:04 Had a fall or experienced change in No Accompanied By: self activities of daily living that may affect Transfer Assistance: None risk of falls: Patient Identification Verified: Yes Signs or symptoms of abuse/neglect since last visito No Patient Requires Transmission-Based Precautions: No Hospitalized since last visit: No Patient Has Alerts: No Implantable device outside of the clinic excluding No cellular tissue based products placed in the center since last visit: Has Dressing in Place as Prescribed: Yes Pain Present Now: Yes Electronic Signature(s) Signed: 03/21/2022 12:52:17 PM By: Dellie Catholic RN Entered By: Dellie Catholic on 03/21/2022 10:05:37 -------------------------------------------------------------------------------- Clinic Level of Care Assessment Details Patient Name: Date of Service: [name] [name] 03/21/2022 10:15 A M Medical Record Number: [id number] Patient Account Number: [id number] Date of Birth/Sex: Treating RN: [birth year] (51 y.o. [name] Primary Care Varick Keys: [name] Other Clinician: Referring [name]: Treating Danija Gosa/Extender: Elyse Jarvis Weeks in Treatment: 57 Clinic Level of Care Assessment Items TOOL 4 Quantity Score X- 1 0 Use when only  an EandM is performed on FOLLOW-UP visit ASSESSMENTS - Nursing Assessment / Reassessment X- 1 10 Reassessment of Co-morbidities (includes updates in patient status) X- 1 5 Reassessment of Adherence to Treatment Plan ASSESSMENTS - Wound and Skin A ssessment / Reassessment _0  - 0 Simple Wound Assessment / Reassessment - one wound X- 2 5 Complex Wound Assessment / Reassessment - multiple wounds _1  - 0 Dermatologic / Skin Assessment (not related to wound area) ASSESSMENTS - Focused Assessment X- 2 5 Circumferential Edema Measurements - multi extremities _2  - 0 Nutritional Assessment / Counseling / Intervention [name] ([id number]) [id number] Page 2 of 10 X- 1 5 Lower Extremity Assessment (monofilament, tuning fork, pulses) _3  - 0 Peripheral Arterial Disease Assessment (using hand held doppler) ASSESSMENTS - Ostomy and/or Continence Assessment and Care _4  - 0 Incontinence Assessment and Management _5  - 0 Ostomy Care Assessment and Management (repouching, etc.) PROCESS - Coordination of Care X - Simple Patient / Family Education for ongoing care 1 15 _6  - 0 Complex (extensive) Patient / Family Education for ongoing care X- 1 10 Staff obtains Programmer, systems, Records, T Results / Process Orders est X- 1 10 Staff telephones HHA, Nursing Homes / Clarify orders / etc _7  - 0 Routine Transfer to another Facility (non-emergent condition) _8  - 0 Routine Hospital Admission (non-emergent condition) _9  - 0 New Admissions / Biomedical engineer / Ordering NPWT Apligraf, etc. , _10  - 0 Emergency Hospital Admission (emergent condition) X- 1 10 Simple Discharge Coordination _11  - 0 Complex (extensive) Discharge Coordination PROCESS - Special Needs _12  - 0 Pediatric / Minor Patient Management _13  - 0 Isolation Patient Management _14  - 0 Hearing / Language / Visual special needs _15  - 0 Assessment of Community assistance (transportation, D/C planning,  etc.) _16  - 0 Additional assistance / Altered mentation _17  - 0 Support Surface(s) Assessment (bed, cushion, seat, etc.) INTERVENTIONS - Wound Cleansing / Measurement _18  - 0 Simple Wound Cleansing - one wound X-  2 5 Complex Wound Cleansing - multiple wounds X- 1 5 Wound Imaging (photographs - any number of wounds) _0  - 0 Wound Tracing (instead of photographs) _1  - 0 Simple Wound Measurement - one wound X- 2 5 Complex Wound Measurement - multiple wounds INTERVENTIONS - Wound Dressings _2  - 0 Small Wound Dressing one or multiple wounds X- 2 15 Medium Wound Dressing one or multiple wounds _3  - 0 Large Wound Dressing one or multiple wounds <CBSWHQPRFFMBWGYK>_5<\/LDJTTSVXBLTJQZES>_9  - 0 Application of Medications - topical <QZRAQTMAUQJFHLKT>_6<\/YBWLSLHTDSKAJGOT>_1  - 0 Application of Medications - injection INTERVENTIONS - Miscellaneous _6  - 0 External ear exam _7  - 0 Specimen Collection (cultures, biopsies, blood, body fluids, etc.) _8  - 0 Specimen(s) / Culture(s) sent or taken to Lab for analysis _9  - 0 Patient Transfer (multiple staff / Civil Service fast streamer / Similar devices) _10  - 0 Simple Staple / Suture removal (25 or less) _11  - 0 Complex Staple / Suture removal (26 or more) _12  - 0 Hypo / Hyperglycemic Management (close monitor of Blood Glucose) Ensz, Laureen ([id number]) [id number] Page 3 of 10 _13  - 0 Ankle / Brachial Index ([name]) - do not check if billed separately X- 1 5 Vital Signs Has the patient been seen at the hospital within the last three years: Yes Total Score: 145 Level Of Care: New/Established - Level 4 Electronic Signature(s) Signed: 03/21/2022 12:52:17 PM By: Dellie Catholic RN Entered By: Dellie Catholic on 03/21/2022 12:47:16 -------------------------------------------------------------------------------- Encounter Discharge Information Details Patient Name: Date of Service: [name], [name] 03/21/2022 10:15 A M Medical Record Number: [id number] Patient Account Number: [id number] Date of Birth/Sex:  Treating RN: 02/02/1971 (51 y.o. [name] Primary Care Giliana Vantil: [name] Other Clinician: Referring Valerya Maxton: Treating Tiannah Greenly/Extender: Elyse Jarvis Weeks in Treatment: 52 Encounter Discharge Information Items Discharge Condition: Stable Ambulatory Status: Cane Discharge Destination: Home Transportation: Private Auto Accompanied By: self Schedule Follow-up Appointment: Yes Clinical Summary of Care: Patient Declined Electronic Signature(s) Signed: 03/21/2022 12:52:17 PM By: Dellie Catholic RN Entered By: Dellie Catholic on 03/21/2022 12:48:23 -------------------------------------------------------------------------------- Lower Extremity Assessment Details Patient Name: Date of Service: [name], [name] [name] 03/21/2022 10:15 A M Medical Record Number: [id number] Patient Account Number: [id number] Date of Birth/Sex: Treating RN: [birth year] (51 y.o. [name] Primary Care Saylah Ketner: [name] Other Clinician: Referring Edenilson Austad: Treating Trilby Way/Extender: Elyse Jarvis Weeks in Treatment: 48 Edema Assessment Assessed: [Left: No] [Right: No] Edema: [Left: Ye] [Right: s] Calf Left: Right: Point of Measurement: 33 cm From Medial Instep 29.1 cm Ankle Left: Right: Point of Measurement: 10 cm From Medial Instep 19.5 cm Vascular Assessment Furber, Evanne ([id number]) [Right:[id number] Page 4 of 10] Pulses: Dorsalis Pedis Palpable: [Right:Yes] Electronic Signature(s) Signed: 03/21/2022 12:52:17 PM By: Dellie Catholic RN Entered By: Dellie Catholic on 03/21/2022 10:09:57 -------------------------------------------------------------------------------- Multi Wound Chart Details Patient Name: Date of Service: [name], [name] 03/21/2022 10:15 A M Medical Record Number: [id number] Patient Account Number: [id number] Date of Birth/Sex: Treating RN: 1971/01/12 (51 y.o. F) Primary Care  Adael Culbreath: [name] Other Clinician: Referring Nathan Stallworth: Treating Henessy Rohrer/Extender: Elyse Jarvis Weeks in Treatment: 44 Vital Signs Height(in): 67 Pulse(bpm): 2 Weight(lbs): Blood Pressure(mmHg): 136/72 Body Mass Index(BMI): Temperature(F): 97.9 Respiratory Rate(breaths/min): 16 [17:Photos:] [N/A:N/A] Right, Lateral Lower Leg Right, Medial Ankle N/A Wound Location: Gradually Appeared Gradually Appeared N/A Wounding Event: Sickle Cell Lesion Sickle Cell Lesion N/A Primary Etiology: N/A Venous Leg Ulcer N/A Secondary Etiology: Anemia, Sickle Cell Disease, Anemia, Sickle Cell Disease, N/A Comorbid History: Neuropathy Neuropathy 10/05/2012 06/26/2021 N/A  Date Acquired: 66 38 N/A Weeks of Treatment: Open Open N/A Wound Status: No No N/A Wound Recurrence: Yes No N/A Clustered Wound: 7x4.8x0.1 7.2x3.2x0.1 N/A Measurements L x W x D (cm) 26.389 18.096 N/A A (cm) : rea 2.639 1.81 N/A Volume (cm) : 86.00% 37.60% N/A % Reduction in Area: 86.00% 37.50% N/A % Reduction in Volume: Full Thickness Without Exposed Full Thickness Without Exposed N/A Classification: Support Structures Support Structures Large Medium N/A Exudate Amount: Serosanguineous Serosanguineous N/A Exudate Type: red, [name] red, [name] N/A Exudate Color: Distinct, outline attached Distinct, outline attached N/A Wound Margin: Small (1-33%) Small (1-33%) N/A Granulation Amount: Pink, Pale Pink N/A Granulation Quality: Large (67-100%) Large (67-100%) N/A Necrotic Amount: Fat Layer (Subcutaneous Tissue): Yes Fat Layer (Subcutaneous Tissue): Yes N/A Exposed Structures: Fascia: No Fascia: No Tendon: No Tendon: No Muscle: No Muscle: No Joint: No Joint: No Bone: No Bone: No Small (1-33%) Small (1-33%) N/A Epithelialization: No Abnormalities Noted No Abnormalities Noted N/A Periwound Skin Texture: No Abnormalities Noted No Abnormalities Noted N/A Periwound Skin  Moisture: No Abnormalities Noted No Abnormalities Noted N/A Periwound Skin ColorEula Fried ([id number]) [id number] Page 5 of 10 No Abnormality No Abnormality N/A Temperature: Treatment Notes Electronic Signature(s) Signed: 03/21/2022 10:24:51 AM By: Fredirick Maudlin MD FACS Entered By: Fredirick Maudlin on 03/21/2022 10:24:51 -------------------------------------------------------------------------------- Multi-Disciplinary Care Plan Details Patient Name: Date of Service: [name], [name] 03/21/2022 10:15 A M Medical Record Number: [id number] Patient Account Number: [id number] Date of Birth/Sex: Treating RN: February 22, 1971 (51 y.o. [name] Primary Care Sayre Witherington: [name] Other Clinician: Referring Vanassa Penniman: Treating Torri Langston/Extender: Elyse Jarvis Weeks in Treatment: 62 Multidisciplinary Care Plan reviewed with physician Active Inactive Venous Leg Ulcer Nursing Diagnoses: Actual venous Insuffiency (use after diagnosis is confirmed) Knowledge deficit related to disease process and management Goals: Patient will maintain optimal edema control Date Initiated: 06/26/2021 Target Resolution Date: 05/19/2022 Goal Status: Active Interventions: Assess peripheral edema status every visit. Compression as ordered Treatment Activities: Therapeutic compression applied : 06/26/2021 Notes: Wound/Skin Impairment Nursing Diagnoses: Impaired tissue integrity Goals: Patient/caregiver will verbalize understanding of skin care regimen Date Initiated: 04/17/2021 Target Resolution Date: 05/19/2022 Goal Status: Active Ulcer/skin breakdown will have a volume reduction of 30% by week 4 Date Initiated: 04/17/2021 Date Inactivated: 05/29/2021 Target Resolution Date: 05/15/2021 Goal Status: Met Ulcer/skin breakdown will have a volume reduction of 50% by week 8 Date Initiated: 05/29/2021 Date Inactivated: 06/26/2021 Target Resolution  Date: 06/26/2021 Goal Status: Unmet Unmet Reason: venous reflux Interventions: Assess patient/caregiver ability to obtain necessary supplies Assess patient/caregiver ability to perform ulcer/skin care regimen upon admission and as needed Assess ulceration(s) every visit Provide education on ulcer and skin care Treatment Activities: Topical wound management initiated : 04/17/2021 [name] ([id number]) [id number] Page 6 of 10 Notes: 06/08/21: Left leg wounds greater than 30% volume reduction, right leg acute infection. Electronic Signature(s) Signed: 03/21/2022 12:52:17 PM By: Dellie Catholic RN Entered By: Dellie Catholic on 03/21/2022 12:44:58 -------------------------------------------------------------------------------- Pain Assessment Details Patient Name: Date of Service: [name], Mc[place]ty[place] [name] 03/21/2022 10:15 A M Medical Record Number: [id number] Patient Account Number: [id number] Date of Birth/Sex: Treating RN: 1970-05-30 (52 y.o. [name] Primary Care Allysha Tryon: [name] Other Clinician: Referring Terrel Manalo: Treating Pape Parson/Extender: Elyse Jarvis Weeks in Treatment: 69 Active Problems Location of Pain Severity and Description of Pain Patient Has Paino Yes Site Locations Pain Location: Generalized Pain With Dressing Change: No Duration of the Pain. Constant / Intermittento Constant Rate the pain. Current Pain Level: 4  Worst Pain Level: 10 Least Pain Level: 3 Tolerable Pain Level: 4 Character of Pain Describe the Pain: Difficult to Pinpoint Pain Management and Medication Current Pain Management: Medication: Yes Cold Application: No Rest: Yes Massage: No Activity: No T.E.N.S.: No Heat Application: No Leg drop or elevation: No Is the Current Pain Management Adequate: Adequate How does your wound impact your activities of daily livingo Sleep: No Bathing: No Appetite: No Relationship With Others:  No Bladder Continence: No Emotions: No Bowel Continence: No Work: No Toileting: No Drive: No Dressing: No Hobbies: No Electronic Signature(s) Signed: 03/21/2022 12:52:17 PM By: Dellie Catholic RN Entered By: Dellie Catholic on 03/21/2022 10:06:54 [name] ([id number]) [id number] Page 7 of 10 -------------------------------------------------------------------------------- Patient/Caregiver Education Details Patient Name: Date of Service: [name], [place] [name] 11/2/2023andnbsp10:15 A M Medical Record Number: [id number] Patient Account Number: [id number] Date of Birth/Gender: Treating RN: 01-17-1971 (50 y.o. [name] Primary Care Physician: [name] Other Clinician: Referring Physician: Treating Physician/Extender: Elyse Jarvis Weeks in Treatment: 39 Education Assessment Education Provided To: Patient Education Topics Provided Wound/Skin Impairment: Methods: Explain/Verbal Responses: Return demonstration correctly Electronic Signature(s) Signed: 03/21/2022 12:52:17 PM By: Dellie Catholic RN Entered By: Dellie Catholic on 03/21/2022 12:45:23 -------------------------------------------------------------------------------- Wound Assessment Details Patient Name: Date of Service: [name], [place] [name] 03/21/2022 10:15 A M Medical Record Number: [id number] Patient Account Number: [id number] Date of Birth/Sex: Treating RN: May 23, 1970 (51 y.o. [name] Primary Care Avigayil Ton: [name] Other Clinician: Referring Laniqua Torrens: Treating Dilia Alemany/Extender: Elyse Jarvis Weeks in Treatment: 36 Wound Status Wound Number: 17 Primary Etiology: Sickle Cell Lesion Wound Location: Right, Lateral Lower Leg Wound Status: Open Wounding Event: Gradually Appeared Comorbid History: Anemia, Sickle Cell Disease, Neuropathy Date Acquired: 10/05/2012 Weeks Of Treatment: 48 Clustered Wound:  Yes Photos Wound Measurements Length: (cm) 7 Width: (cm) 4.8 Alcock, Vaniyah ([id number]) Depth: (cm) 0.1 Area: (cm) 26.389 Volume: (cm) 2.639 % Reduction in Area: 86% % Reduction in Volume: 86% [id number] Page 8 of 10 Epithelialization: Small (1-33%) Tunneling: No Undermining: No Wound Description Classification: Full Thickness Without Exposed Support Structures Wound Margin: Distinct, outline attached Exudate Amount: Large Exudate Type: Serosanguineous Exudate Color: red, [name] Foul Odor After Cleansing: No Slough/Fibrino Yes Wound Bed Granulation Amount: Small (1-33%) Exposed Structure Granulation Quality: Pink, Pale Fascia Exposed: No Necrotic Amount: Large (67-100%) Fat Layer (Subcutaneous Tissue) Exposed: Yes Necrotic Quality: Adherent Slough Tendon Exposed: No Muscle Exposed: No Joint Exposed: No Bone Exposed: No Periwound Skin Texture Texture Color No Abnormalities Noted: Yes No Abnormalities Noted: Yes Moisture Temperature / Pain No Abnormalities Noted: Yes Temperature: No Abnormality Treatment Notes Wound #17 (Lower Leg) Wound Laterality: Right, Lateral Cleanser Soap and Water Discharge Instruction: May shower and wash wound with dial antibacterial soap and water prior to dressing change. Wound Cleanser Discharge Instruction: Cleanse the wound with wound cleanser prior to applying a clean dressing using gauze sponges, not tissue or cotton balls. Peri-Wound Care Triamcinolone 15 (g) Discharge Instruction: Use triamcinolone 15 (g) as directed Sween Lotion (Moisturizing lotion) Discharge Instruction: Apply moisturizing lotion as directed Topical Primary Dressing MediHoney Gel, tube 1.5 (oz) Discharge Instruction: Apply to wound bed as instructed Secondary Dressing ABD Pad, 5x9 Discharge Instruction: Apply over primary dressing as directed. Zetuvit Plus 4x8 in Discharge Instruction: Apply over primary dressing as  directed. Secured With Elastic Bandage 4 inch (ACE bandage) Discharge Instruction: Secure with ACE bandage as directed. Compression Wrap Kerlix Roll 4.5x3.1 (in/yd) Discharge Instruction: Apply Kerlix and Coban compression as directed. Compression Stockings Add-Ons  Electronic Signature(s) Signed: 03/21/2022 12:52:17 PM By: Dellie Catholic RN Signed: 03/21/2022 4:46:26 PM By: Blanche East RN Entered By: Blanche East on 03/21/2022 10:12:34 [name] ([id number]) [id number] Page 9 of 10 -------------------------------------------------------------------------------- Wound Assessment Details Patient Name: Date of Service: [name] [name] 03/21/2022 10:15 A M Medical Record Number: [id number] Patient Account Number: [id number] Date of Birth/Sex: Treating RN: March 30, 1971 (51 y.o. [name], [name] Primary Care Syler Norcia: [name] Other Clinician: Referring Yamen Castrogiovanni: Treating Erendira Crabtree/Extender: Elyse Jarvis Weeks in Treatment: 48 Wound Status Wound Number: 21 Primary Etiology: Sickle Cell Lesion Wound Location: Right, Medial Ankle Secondary Etiology: Venous Leg Ulcer Wounding Event: Gradually Appeared Wound Status: Open Date Acquired: 06/26/2021 Comorbid History: Anemia, Sickle Cell Disease, Neuropathy Weeks Of Treatment: 38 Clustered Wound: No Photos Wound Measurements Length: (cm) 7.2 Width: (cm) 3.2 Depth: (cm) 0.1 Area: (cm) 18.096 Volume: (cm) 1.81 % Reduction in Area: 37.6% % Reduction in Volume: 37.5% Epithelialization: Small (1-33%) Tunneling: No Undermining: No Wound Description Classification: Full Thickness Without Exposed Support Structures Wound Margin: Distinct, outline attached Exudate Amount: Medium Exudate Type: Serosanguineous Exudate Color: red, [name] Foul Odor After Cleansing: No Slough/Fibrino Yes Wound Bed Granulation Amount: Small (1-33%) Exposed Structure Granulation Quality:  Pink Fascia Exposed: No Necrotic Amount: Large (67-100%) Fat Layer (Subcutaneous Tissue) Exposed: Yes Necrotic Quality: Adherent Slough Tendon Exposed: No Muscle Exposed: No Joint Exposed: No Bone Exposed: No Periwound Skin Texture Texture Color No Abnormalities Noted: Yes No Abnormalities Noted: Yes Moisture Temperature / Pain No Abnormalities Noted: Yes Temperature: No Abnormality Treatment Notes Wound #21 (Ankle) Wound Laterality: Right, Medial Cleanser Soap and Water Discharge Instruction: May shower and wash wound with dial antibacterial soap and water prior to dressing change. BRITTANNI, CARIKER ([id number]) [id number] Page 10 of 10 Wound Cleanser Discharge Instruction: Cleanse the wound with wound cleanser prior to applying a clean dressing using gauze sponges, not tissue or cotton balls. Peri-Wound Care Triamcinolone 15 (g) Discharge Instruction: Use triamcinolone 15 (g) as directed Sween Lotion (Moisturizing lotion) Discharge Instruction: Apply moisturizing lotion as directed Topical Primary Dressing MediHoney Gel, tube 1.5 (oz) Discharge Instruction: Apply to wound bed as instructed Secondary Dressing ABD Pad, 5x9 Discharge Instruction: Apply over primary dressing as directed. Zetuvit Plus 4x8 in Discharge Instruction: Apply over primary dressing as directed. Secured With Elastic Bandage 4 inch (ACE bandage) Discharge Instruction: Secure with ACE bandage as directed. Compression Wrap Kerlix Roll 4.5x3.1 (in/yd) Discharge Instruction: Apply Kerlix and Coban compression as directed. Compression Stockings Add-Ons Electronic Signature(s) Signed: 03/21/2022 4:46:26 PM By: Blanche East RN Entered By: Blanche East on 03/21/2022 10:12:05 -------------------------------------------------------------------------------- Vitals Details Patient Name: Date of Service: [name] [name], [name] 03/21/2022 10:15 A M Medical Record Number:  [id number] Patient Account Number: [id number] Date of Birth/Sex: Treating RN: 11/10/1970 (51 y.o. [name] Primary Care Anelia Carriveau: [name] Other Clinician: Referring Maylin Freeburg: Treating Alecxander Mainwaring/Extender: Elyse Jarvis Weeks in Treatment: 48 Vital Signs Time Taken: 10:05 Temperature (F): 97.9 Height (in): 67 Pulse (bpm): 69 Respiratory Rate (breaths/min): 16 Blood Pressure (mmHg): 136/72 Reference Range: 80 - 120 mg / dl Electronic Signature(s) Signed: 03/21/2022 12:52:17 PM By: Dellie Catholic RN Entered By: Dellie Catholic on 03/21/2022 10:05:59

## 2022-03-21 NOTE — Telephone Encounter (Signed)
Error

## 2022-03-24 LAB — OXYCODONE/OXYMORPHONE, CONFIRM
OXYCODONE/OXYMORPH: POSITIVE — AB
OXYCODONE: 908 ng/mL
OXYCODONE: POSITIVE — AB
OXYMORPHONE (GC/MS): 1773 ng/mL
OXYMORPHONE: POSITIVE — AB

## 2022-03-24 LAB — DRUG SCREEN 764883 11+OXYCO+ALC+CRT-BUND
Amphetamines, Urine: NEGATIVE ng/mL
BENZODIAZ UR QL: NEGATIVE ng/mL
Barbiturate: NEGATIVE ng/mL
Cannabinoid Quant, Ur: NEGATIVE ng/mL
Cocaine (Metabolite): NEGATIVE ng/mL
Creatinine: 78.7 mg/dL (ref 20.0–300.0)
Ethanol: NEGATIVE %
Meperidine: NEGATIVE ng/mL
Methadone Screen, Urine: NEGATIVE ng/mL
Phencyclidine: NEGATIVE ng/mL
Propoxyphene: NEGATIVE ng/mL
Tramadol: NEGATIVE ng/mL
pH, Urine: 5.9 (ref 4.5–8.9)

## 2022-03-24 LAB — OPIATES CONFIRMATION, URINE: Opiates: NEGATIVE ng/mL

## 2022-03-27 DIAGNOSIS — D649 Anemia, unspecified: Secondary | ICD-10-CM | POA: Diagnosis not present

## 2022-03-27 NOTE — Discharge Summary (Addendum)
[facility] Discharge Summary   Patient ID: [name] MRN: [id number] DOB/AGE: [id number] y.o.  Admit date: 03/20/2022 Discharge date: 03/20/2022  Primary Care Physician:  [name], FNP  Admission Diagnoses:  Principal Problem:   Symptomatic anemia Active Problems:   Chronic pain syndrome   Acute respiratory failure with hypoxia [facility])  Discharge Medications:  Allergies as of 03/27/2022   No Known Allergies      Medication List     TAKE these medications    aspirin EC 81 MG tablet Take 1 tablet (81 mg total) by mouth daily.   Deferiprone 500 MG Tabs Take 500 mg by mouth daily at 12 noon.   folic acid 1 MG tablet Commonly known as: FOLVITE Take 5 tablets (5 mg total) by mouth daily. What changed: how much to take   gabapentin 300 MG capsule Commonly known as: NEURONTIN TAKE 1 CAPSULE(300 MG) BY MOUTH THREE TIMES DAILY   ibuprofen 600 MG tablet Commonly known as: ADVIL Take 1 tablet (600 mg total) by mouth every 6 (six) hours as needed for mild pain.   Juven Powd Take 1 each by mouth 3 (three) times daily between meals.   Oxbryta 500 MG Tabs tablet Generic drug: voxelotor Take 2 tablets (1,000 mg) by mouth daily.   Oxycodone HCl 10 MG Tabs Take 1 tablet (10 mg total) by mouth every 4 (four) hours while awake.         Consults:  None  Significant Diagnostic Studies:  No results found.   History of present illness:  [name]  is a 51 y.o. female with a medical history significant for sickle cell disease type SS, chronic pain syndrome, opiate dependence and tolerance, and history of bilateral lower extremity foot ulcers who presented to sick[facility] cell day infusion center with hemoglobin of 5.2 and some shortness of breath.  On 03/19/2022, patient was evaluated by PCP and was found to have a hemoglobin that was decreased below her baseline at 5.2.  Patient was advised to report to day clinic for transfusion of 2 units  PRBCs.  Upon arrival, H&H was repeated and patient has continued to hemolyze overnight.  Current hemoglobin is 4.6 g/dL.  Patient also reports some fatigue.  She has some pain, but it is consistent with her typical chronic pain.  Patient last had oxycodone this a.m. with moderate relief. Patient denies headache, dizziness, blurred vision, chest pain, urinary symptoms, nausea, vomiting, or diarrhea.  She has not had any sick contacts, recent travel, or known exposure to COVID-19.  During day hospital admission, oxygen saturation was found to be 80-89% on room air. Inpatient admission rescinded.  Patient's condition much improved.  She will return to clinic in a.m. for posttransfusion labs and assessment. [facility] Course: Symptomatic anemia:  On admission, hemoglobin 4.6 g/dL. Patient transfused 2 units PRBCs. Patient to return to clinic on 03/21/2022 for post transfusion labs.   Hypoxia:  Hypoxia improved. Oxygen saturation above 90% prior to discharge.    Discharge instructions:  Resume all home medications.   Follow up with PCP as previously  scheduled.   Discussed the importance of drinking 64 ounces of water daily, dehydration of red blood cells may lead further sickling.   Avoid all stressors that precipitate sickle cell pain crisis.     The patient was given clear instructions to go to ER or return to medical center if symptoms do not improve, worsen or new problems develop.   Physical  Exam at Discharge:  BP 116/68 (BP Location: Left Arm)   Pulse 68   Temp 98.9 F (37.2 C) (Temporal)   Resp 16   SpO2 92%   Physical Exam Eyes:     General: Scleral icterus present.     Pupils: Pupils are equal, round, and reactive to light.  Cardiovascular:     Rate and Rhythm: Normal rate and regular rhythm.  Musculoskeletal:        General: Normal range of motion.  Skin:    General: Skin is warm.  Neurological:     General: No focal deficit present.     Mental Status:  She is alert. Mental status is at baseline.  Psychiatric:        Mood and Affect: Mood normal.        Thought Content: Thought content normal.        Judgment: Judgment normal.       Disposition at Discharge: Discharge disposition: 01-Home or Self Care       Discharge Orders: Discharge Instructions     Discharge patient   Complete by: As directed    Discharge disposition: 01-Home or Self Care   Discharge patient date: 03/27/2022       Condition at Discharge:   Stable  Time spent on Discharge:  Greater than 30 minutes.  Signed: [name]  APRN, MSN, FNP-C [facility] [address] [place], [place] [address] [phone]  03/27/2022, 10:37 AM  Evaluation and management procedures were performed by the Advanced Practitioner under my supervision and collaboration. I have reviewed the Advanced Practitioner's note and chart, and I agree with the management and plan.   Angelica Chessman, MD, MHA, [place], [place], [place], [place] 03/27/2022, 5:30 PM

## 2022-04-04 ENCOUNTER — Encounter (HOSPITAL_BASED_OUTPATIENT_CLINIC_OR_DEPARTMENT_OTHER): Payer: Medicaid Other | Admitting: General Surgery

## 2022-04-04 DIAGNOSIS — L97818 Non-pressure chronic ulcer of other part of right lower leg with other specified severity: Secondary | ICD-10-CM | POA: Diagnosis not present

## 2022-04-05 NOTE — Progress Notes (Signed)
[name] ([id number]) [id number] Page 1 of 16 Visit Report for 04/04/2022 Chief Complaint Document Details Patient Name: Date of Service: [name] [name] 04/04/2022 10:15 A M Medical Record Number: [id number] Patient Account Number: [id number] Date of Birth/Sex: Treating RN: Dec 20, 1970 (51 y.o. F) Primary Care Provider: [name] Other Clinician: Referring Provider: Treating Provider/Extender: Elyse Jarvis Weeks in Treatment: 41 Information Obtained from: Patient Chief Complaint the patient is here for evaluation of her bilateral lower extremity sickle cell ulcers 04/17/2021; patient comes in for substantial wounds on the right and left lower leg Electronic Signature(s) Signed: 04/04/2022 11:26:31 AM By: Fredirick Maudlin MD FACS Entered By: Fredirick Maudlin on 04/04/2022 11:26:31 -------------------------------------------------------------------------------- Debridement Details Patient Name: Date of Service: [name], [name] 04/04/2022 10:15 A M Medical Record Number: [id number] Patient Account Number: [id number] Date of Birth/Sex: Treating RN: 07-16-1970 (51 y.o. [name] Primary Care Provider: [name] Other Clinician: Referring Provider: Treating Provider/Extender: Elyse Jarvis Weeks in Treatment: 50 Debridement Performed for Assessment: Wound #21 Right,Medial Ankle Performed By: Physician Fredirick Maudlin, MD Debridement Type: Debridement Severity of Tissue Pre Debridement: Fat layer exposed Level of Consciousness (Pre-procedure): Awake and Alert Pre-procedure Verification/Time Out Yes - 10:55 Taken: Start Time: 10:55 Pain Control: Lidocaine 4% T opical Solution T Area Debrided (L x W): otal 6.5 (cm) x 3.5 (cm) = 22.75 (cm) Tissue and other material debrided: Non-Viable, Slough, Slough Level: Non-Viable Tissue Debridement Description: Selective/Open Wound Instrument:  Curette Bleeding: Minimum Hemostasis Achieved: Pressure End Time: 10:56 Procedural Pain: 0 Post Procedural Pain: 0 Response to Treatment: Procedure was tolerated well Level of Consciousness (Post- Responds to Painful Stimuli procedure): Post Debridement Measurements of Total Wound Length: (cm) 7.2 Width: (cm) 3.5 Depth: (cm) 0.1 Volume: (cm) 1.979 [name] ([id number]) [id number] Page 2 of 16 Character of Wound/Ulcer Post Debridement: Improved Severity of Tissue Post Debridement: Fat layer exposed Post Procedure Diagnosis Same as Pre-procedure Notes Scribed for Dr. Celine Ahr by J.[name] Electronic Signature(s) Signed: 04/04/2022 12:38:27 PM By: Fredirick Maudlin MD FACS Signed: 04/05/2022 7:55:48 AM By: Dellie Catholic RN Entered By: Dellie Catholic on 04/04/2022 11:12:25 -------------------------------------------------------------------------------- Debridement Details Patient Name: Date of Service: [name], [name] 04/04/2022 10:15 A M Medical Record Number: [id number] Patient Account Number: [id number] Date of Birth/Sex: Treating RN: [birth year] (51 y.o. [name] Primary Care Provider: [name] Other Clinician: Referring Provider: Treating Provider/Extender: Elyse Jarvis Weeks in Treatment: 50 Debridement Performed for Assessment: Wound #17 Right,Lateral Lower Leg Performed By: Physician Fredirick Maudlin, MD Debridement Type: Debridement Level of Consciousness (Pre-procedure): Awake and Alert Pre-procedure Verification/Time Out Yes - 10:55 Taken: Start Time: 10:55 Pain Control: Lidocaine 4% T opical Solution T Area Debrided (L x W): otal 7 (cm) x 4.5 (cm) = 31.5 (cm) Tissue and other material debrided: Non-Viable, Slough, Slough Level: Non-Viable Tissue Debridement Description: Selective/Open Wound Instrument: Curette Bleeding: Minimum Hemostasis Achieved: Pressure End Time:  10:56 Procedural Pain: 0 Post Procedural Pain: 0 Response to Treatment: Procedure was tolerated well Level of Consciousness (Post- Responds to Painful Stimuli procedure): Post Debridement Measurements of Total Wound Length: (cm) 7 Width: (cm) 5 Depth: (cm) 0.1 Volume: (cm) 2.749 Character of Wound/Ulcer Post Debridement: Improved Post Procedure Diagnosis Same as Pre-procedure Notes Scribed for Dr. Celine Ahr by J.[name] Electronic Signature(s) Signed: 04/04/2022 12:38:27 PM By: Fredirick Maudlin MD FACS Signed: 04/05/2022 7:55:48 AM By: Dellie Catholic RN Entered By: Dellie Catholic on 04/04/2022 11:12:41 [name] ([id number]) [id number] Page 3 of 16 -------------------------------------------------------------------------------- HPI Details  Patient Name: Date of Service: [name] [name] 04/04/2022 10:15 A M Medical Record Number: [id number] Patient Account Number: [id number] Date of Birth/Sex: Treating RN: 1970-12-10 (51 y.o. F) Primary Care Provider: [name] Other Clinician: Referring Provider: Treating Provider/Extender: Elyse Jarvis Weeks in Treatment: 50 History of Present Illness Location: medial and lateral ankle region on the right and left medial malleolus Quality: Patient reports experiencing a shooting pain to affected area(s). Severity: Patient states wound(s) are getting worse. Duration: right lower extremity bimalleolar ulcers have been present for approximately 2 years; the rright meedial malleolus ulcer has been there proximally 6 months Timing: Pain in wound is constant (hurts all the time) Context: The wound would happen gradually ssociated Signs and Symptoms: Patient reports having increase discharge. A HPI Description: 51 year old patient with a history of sickle cell anemia who was last seen by me with ulceration of the right lower extremity above the ankle and was referred to Dr. Leland Johns  for a surgical debridement as I was unable to do anything in the office due to excruciating pain. At that stage she was referred from the plastic surgery service to dermatology who treated her for a skin infection with doxycycline and then Levaquin and a local antibiotic ointment. I understand the patient has since developed ulceration on the left ankle both medial and lateral and was now referred back to the wound center as dermatology has finished the management. I do not have any notes from the dermatology department Old notes: 51 year old patient with a history of sickle cell anemia, pain bilateral lower extremities, right lower extremity ulcer and has a history of receiving a skin graft( Theraskin) several months ago. She has been visiting the wound center [facility] and was seen by Dr. Dellia Nims and Dr. Leland Johns. after prolonged conservator therapy between July 2016 and January 2017. She had been seen by the plastic surgeon and taken to the OR for debridement and application of Theraskin. She had 3 applications of Theraskin and was then treated with collagen. Prior to that she had a history of similar problems in 2014 and was treated conservatively. Had a reflux study done for the right lower extremity in August 2016 without reflux or DVT . Past medical history significant for sickle cell disease, anemia, leg ulcers, cholelithiasis,and has never been a smoker. Once the patient was discharged on the wound center she says within 2 or 3 weeks the problems recurred and she has been treating it conservatively. since I saw her 3 weeks ago at [facility] she has been unable to get her dressing material but has completed a course of doxycycline. 6/7/ 2017 -- lower extremity venous duplex reflux evaluation was done No evidence of SVT or DVT in the RLL. No venous incompetence in the RLL. No further vascular workup is indicated at this time. She was seen by Dr. [name], on 10/04/2015. She agreed with  the plan of taking her to the OR for debridement and application of theraskin and would also take biopsies to rule out pyoderma gangrenosum. Follow-up note dated May 31 received and she was status post application of Theraskin to multiple ulcers around the right ankle. Pathology did not show evidence of malignancy or pyoderma gangrenosum. She would continue to see as in the wound clinic for further care and see Dr. Leland Johns as needed. The patient brought the biopsy report and it was consistent with stasis ulcer no evidence of malignancy and the comment was that there was some adjacent neovascularization, fibrosis  and patchy perivascular chronic inflammation. 11/15/2015 -- today we applied her first application of Theraskin 11/30/15; TheraSkin #2 12/13/2015 -- she is having a lot of pain locally and is here for possible application of a theraskin today. 01/16/2016 -- the patient has significant pain and has noticed despite in spite of all local care and oral pain medication. It is impossible to debride her in the office. 02/06/2016 -- I do not see any notes from Dr. Iran Planas( the patient has not made a call to the office know as she heard from them) and the only visit to recently was with her PCP Dr. Danella Penton -- I saw her on 01/16/2016 and prescribed 90 tablets of oxycodone 10 mg and did lab work and screening for HIV. the HIV was negative and hemoglobin was 6.3 with a WBC count of 14.9 and hematocrit of 17.8 with platelets of 561. 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 The bimalleolar ulcers to the right lower extremity have been present for approximately 2 years, the left medial malleolus ulcer has been present approximately 6 months. She has followed with Dr.Thimmappa in the past and has had a total of 3 applications of Theraskin (01/2015, 09/2015, 06/17/16). She has also  followed with Dr. Con Memos here in the clinic and has received 2 applications of TheraSkin (11/10/15, 11/30/15). The patient does experience chronic, and is not amenable to debridement. 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 She is not currently on any antibiotic therapy and has not been treated with any recently. 07/17/2016 -- was seen by Dr. Iran Planas of plastic surgery who saw her 2 weeks postop application of Theraskin #3. She had removed her dressing and asked her to apply silver alginate on alternate days and follow-up back with the wound center. Future debridements and application of skin substitute would have to be done in the hospital due to her high risk for anesthesia. READMISSION 04/17/2021 Patient is now a 51 year old woman that we have had in this clinic for a prolonged period of time and 2016-2017 and then again for 2 visits in February 2018. At that point she had wounds on the right lower leg predominantly medial. She had also been seen by plastic surgery Dr. Leland Johns who I believe took her to the OR for operative debridement and application of TheraSkin in 2017. After she left our clinic she was followed for a very prolonged period of time in the wound care center in [facility] who then referred her ultimately to [facility] where she was seen by Dr. Vernona Rieger. Again taken her to the OR for skin grafting which apparently did not take. She had multiple other attempts at dressings although I have not really looked over all of these notes in great detail. She has not been seen in a wound care center in about a year. She states over the last year in addition to her right lower leg she has developed wounds on the left lower leg quite extensive. She is using Xeroform to all of these wounds without really any improvement. She also has Medicaid which does not cover wound products. The patient has had vascular work-ups in the past including most recently on  03/28/2021 showing biphasic waveforms on the right triphasic at the PTA and biphasic at the dorsalis pedis on the left. She was unable to tolerate any degree of compression to do  ABIs. Unfortunately TBI's were also not done. She had venous reflux studies done in 2017. This did not show any evidence of a DVT or SVT and no venous incompetence was noted in the right leg at the time this was the only side with the wound As noted I did not look all over her old records. She apparently had a course of HBO and [facility] although I am not sure what the indication would have been. In [place] ([id number]) [id number] Page 4 of 16 any case she developed seizures and terminated treatment earlier. She is generally much more disabled than when we last saw her in clinic. She can no longer walk pretty much wheelchair-bound because predominantly of pain in the left hip. 04/24/2021; the patient tolerated the wraps we put on. We used Santyl and Hydrofera Blue under compression. I brought her back for a nurse visit for a change in dressing. With Medicaid we will have a hard time getting anything paid for and hence the need for compression. She arrives in clinic with all the wounds looking somewhat better in terms of surface 12/20; circumferential wound on the right from the lateral to the medial. She has open areas on the left medial and left lateral x2 on all of this with the same surface. This does not look completely healthy although she does have some epithelialization. She is not complaining of a lot of pain which is unusual for her sickle ulcers. I have not looked over her extensive records from [facility]. She had recent arterial studies and has a history of venous reflux studies I will need to look these over although I do not believe she has significant arterial disease 2023 05/22/2021; patient's wound areas measure slightly smaller. Still a lot of drainage coming from the right we  have been using Hydrofera Blue and Santyl with some improvement in the wound surfaces. She tells me she will be getting transfused later in the week for her underlying sickle cell anemia I have looked over her recent arterial studies which were done in the fall. This was in November and showed biphasic and triphasic waveforms but she could not tolerate ABIs because of pressure and unfortunately TBI's were not done. She has not had recent venous reflux studies that I can see 1/10; not much change about the same surface area. This has a yellowish surface to it very gritty. We have been using Santyl and Hydrofera Blue for a prolonged period. Culture I did last week showed methicillin sensitive staph aureus "rare". Our intake nurse reports greenish drainage which may be the Hydrofera Blue itself 1/17; wounds are continue to measure smaller although I am not sure about the accuracy here. Especially the areas on the right are covered in what looks to be a nonviable surface although she does have some epithelialization. Similarly she has areas on the left medial and left lateral ankle area which appear to have a better surface and perhaps are slightly smaller. We have been using Santyl and Hydrofera Blue. She cannot tolerate mechanical debridements She went for her reflux studies which showed significant reflux at the greater saphenous vein at the saphenofemoral junction as well as the greater saphenous vein in the proximal calf on the left she had reflux in the thigh and the common femoral vein and supra vein Fishel vein reflux in the greater saphenous vein. I will have vein and vascular look at this. My thoughts have been that these are likely sickle wounds. I looked through her old  records from [facility] wound care center and then when she graduated to [facility] wound care center where she saw Dr. Zigmund Daniel and Dr. Vernona Rieger. Although I can see she had reflux studies done I do not see that she actually  saw a vein and vascular. I went over the fact that she had operative debridements and actual skin grafting that did not take. I do not think these wounds have ever really progressed towards healing 1/31;Substantial wounds on the right ankle area. Hyper granulated very gritty adherent debris on the surface. 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 On the left she has no DVT or SVT deep vein reflux in the common femoral vein. Superficial vein reflux in the greater saphenous vein on although the vein diameter is not really all that large. I do not think there is anything that can be done with these although I am going to send her for consultation to vein and vascular. 2/7; Wound exam; substantial wound area on the right posterior ankle area and areas on the left medial ankle and left lateral ankle. I was able to debride the left medial ankle last week fairly aggressively and it is back this week to a completely nonviable surface She will see vascular surgery this Friday and I would like them to review the venous studies and also any comments on her arterial status. If they do not see an issue here I am going to refer her to plastic surgery for an operative debridement perhaps intraoperative ACell or Integra. Eventually she will require a deep tissue culture again 2/14; substantial wound area on the right posterior ankle, medial ankle. We have been using silver alginate The patient was seen by vein and vascular she had both venous reflux studies and arterial studies. 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 She had no evidence of superficial venous thrombosis. She did have superficial vein reflux at the saphenofemoral junction and the greater saphenous vein. On the left no evidence of a DVT no evidence of superficial venous throat thrombosis she did have deep vein reflux in the common femoral vein and superficial vein reflux in the greater saphenous vein but these were not felt to be amenable to ablation. In terms of arterial studies she had triphasic and wife biphasic wave waveforms bilaterally not felt to have a significant arterial issue. I do not get the feeling that they felt that any part of her nonhealing wounds were related to either arterial or venous issues. They did note that she had venous reflux at the right at the [facility] and [facility]. And also on the left there were reflux in the deep system at the common femoral vein and greater saphenous vein in the proximal thigh. Nothing amenable to ablation. 2/20; she is making some decent progress on the right where there is nice skin between the 2 open areas on the right ankle. The surfaces here do not look viable yet there is some surrounding epithelialization. She still has a small area on the left medial ankle  area. Hyper-granulated [name] 2/28 patient has an appointment with plastic surgery on 3/8. We will see her back on 3/9. She may have to call us to get the area redressed. We've been using Santyl under silver alginate. We made a nice improvement on the left medial ankle. The larger wounds on the right also looks somewhat better in terms of epithelialization although I think they could benefit from an aggressive debridement if plastic surgery would be willing to do that. Perhaps placement of Integra or a cell 07/26/2021: She saw Dr. Claudia Desanctis yesterday. He raised the question as to whether or not this might be pyoderma and wanted to wait until that question was answered by dermatology before proceeding with any  sort of operative debridement. We have continue to use Santyl under silver alginate with Kerlix and Coban wraps. Overall, her wounds appear to be continuing to contract and epithelialize, with some granulation tissue present. There continues to be some slough on all wound surfaces. 08/09/2021: She has not been able to get an appointment with dermatology because apparently the offices in [place] do not accept Medicaid. She is looking into whether or not she can be seen at the main [facility] dermatology clinic. This is necessary because plastic surgery is concerned that her wounds might represent pyoderma and they did not want to do any procedure until that was clarified. We have been using Santyl under silver alginate with Kerlix and Coban wraps. Today, there was a greater amount of drainage on her dressings with a slight green discoloration and significant odor. Despite this, her wounds continue to contract and epithelialize. There is pale granulation tissue present and actually, on the left medial ankle, the granulation tissue is a bit hypertrophic. 08/16/2021: Last week, I took a culture and this grew back rare methicillin-resistant Staph aureus and rare corynebacterium. The MRSA was sensitive to gentamicin which we began applying topically on an empiric basis. This week, her wounds are a bit smaller and the drainage and odor are less. Her primary care provider is working on assisting the patient with a dermatology evaluation. She has been in silver alginate over the gentamicin that was started last week along with Kerlix and Coban wraps. 08/23/2021: Because she has Medicaid, we have been unable to get her into see any dermatologist in the [place] to rule out pyoderma gangrenosum, which was a requirement from plastic surgery prior to any sort of debridement and grafting. Despite this, however, all of her wounds continue to get smaller. The wound on her left medial ankle is  nearly closed. There is no odor from the wounds, although she still accumulates a modest amount of drainage on her dressings. [name], [name] ([id number]) [id number] Page 5 of 16 08/30/2021: The lateral right ankle wound and the medial left ankle wound are a bit smaller today. The medial right ankle wound is about the same size. They are less tender. We have still been unable to get her into dermatology. 09/06/2021: All of the wounds are about the same size today. She continues to endorse minimal pain. I communicated with Dr. Claudia Desanctis in plastic surgery regarding our issues getting a dermatology appointment; he was out of town but indicated that he would look into perhaps performing the biopsy in his office and will have his office contact her. 09/14/2021: The patient has an appointment in dermatology, but it is not until October. Her wounds are roughly the same; she continues to have very thick purulent-looking drainage on her  dressings. 09/20/2021: The left medial wound is nearly closed and just has a bit of accumulated eschar on the surface. The right medial and lateral ankle wounds are perhaps a little bit smaller. They continue to have a very pale surface with accumulation of thin slough. 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 I did not think [employer] was indicated based on this. She is getting topical mupirocin with Prisma silver collagen. 10/04/2021: The patient was not seen in clinic last week due to childcare coverage issues. In the interim, the left medial leg wound has closed. The right sided leg wounds are smaller. There is more granulation tissue coming through, particularly on the lateral wound. The surface remains somewhat gritty. We have been applying topical mupirocin and Prisma silver collagen. 10/11/2021: The left medial leg wound remains closed. She does complain of some anesthetic sensation to the area. Both of the right-sided leg  wounds are smaller but still have accumulated slough. 10/18/2021: Both right-sided leg wounds are minimally smaller this week. She still continues to accumulate slough and has thick drainage on her dressings. 10/23/2021: Both wounds continue to contract. There is still slough buildup. She has been approved for a keratin-based skin substitute trial product but it will not be available until next week. 10/30/2021: The wounds are about the same to perhaps slightly smaller. There is still continued slough buildup. Unfortunately, the rep for the keratin based product did not show up today and did not answer his phone when called. 11/08/2021: The wounds are little bit smaller today. She continues to have thick drainage but the surfaces are relatively clean with just a little bit of slough accumulation. She reported to me today that she is unable to completely flex her left ankle and on examination it seems this is potentially related to scar tissue from her wounds. We do have the ProgenaMatrix trial product available for her today. 11/15/2021: Both wounds are smaller today. There is some slough accumulation on the surfaces, but the medial wound, in particular looks like it is filling in and is less deep. She did hear from physical therapy and she is going to start working with them on July 11. She is here for her second application of the trial skin substitute, ProgenaMatrix. 11/22/2021: Both wounds continue to contract, the medial more dramatically than the lateral. Both wounds have a layer of slough on the surface, but underneath this, the gritty fibrous tissue has a little bit more of a pink cast to it rather than being as pale as it has been. 11/29/2021: The wounds are roughly the same size this week, perhaps a millimeter or 2 smaller. The medial wound has filled in and is nearly flush with the surrounding skin surface. She continues to have a lot of slough accumulation on both surfaces. 12/06/2021: No  significant change to her wounds, but she has a new opening on her dorsal foot, just distal to the right lateral ankle wound. The area on her left medial ankle that reopened looks a little bit larger today. She has quite a bit of pain associated with the new wound. 12/12/2021: Her wounds look about the same but the new opening on her right lateral dorsal foot is a little bit bigger. She continues to have a fair amount of pain with this wound. 12/26/2021: The left medial ankle wound is tiny and superficial. She has 2 areas of crusting on her left lateral ankle, however, that appear to be threatening to open again.  Her right medial ankle wound is a little bit smaller today but still continues to accumulate thick rubbery slough. The new dorsal foot wound is exquisitely painful but there is no odor or purulent drainage. No erythema or induration. The right lateral ankle wound looks about the same today, again with thick rubbery slough. 01/03/2022: The left medial ankle wound has closed again. Both right ankle wounds appear to be about the same size with thick rubbery slough. The dorsal foot wound on the right continues to be quite painful and she stated that she did not want any debridement of that site today. 01/10/2022: No real change to any of her wounds. She continues to accumulate thick slough. The dorsal foot wound has merged with the lateral malleolar wound. She is experiencing significant pain in the dorsal foot portion of the ulcer. 01/16/2022: Absolutely no change or progress in her wounds. 01/24/2022: Her wounds are unchanged. She continues to build up slough and the wounds on her dorsal right foot are still exquisitely tender. 01/31/2022: The wounds actually measure a little bit narrower today. They still have thick slough on the surface but the underlying tissue seems a little less fibrotic. We changed to Iodosorb last week. 02/07/2022: Wounds continue to slowly epithelialize around the parameters.  She has less pain today. She still accumulates a fairly substantial layer of slough. 02/15/2022: No change to the wounds overall. The measured a little bit larger today per the intake nurse. She continues to have substantial slough accumulation and her pain is a little bit worse. 02/21/2022. No change at all to her wounds. I do not see that plastic surgery has received her referral yet. 02/28/2022: The wounds remain unchanged. They are dry and fibrotic with accumulation of slough and eschar. She did receive an appointment to see plastic surgery on October 23, but the office called her back and indicated they needed to reschedule and that the next available appointment was not until December. The patient became angry and decided she did not want to see this plastics group. 10/19; the patient sees Dr. Lovett Calender again next week. She is using Medihoney as the primary dressing changing this herself. 03/21/2022: She saw Dr. Jaclynn Guarneri at the wound care center at [facility]. Dr. Jaclynn Guarneri was also unable to debride her, secondary to pain and is planning to take her to the operating room for operative debridement and potential skin substitute placement. Her wounds are unchanged with thick yellow slough and a fibrotic base. She says they are too painful to be debrided. In addition, yesterday her hemoglobin was 4 and she received 2 units packed red blood cell transfusion. 04/04/2022: Her operation at [facility] is scheduled for December 15. She continues to use Medihoney on her wounds. They are a little bit less painful today and she is willing to entertain the possibility of debridement. The wounds measured a little bit larger in all dimensions today but the layer of slough is not as thick as usual. Electronic Signature(s) [name], [name] ([id number]) [id number] Page 6 of 16 Signed: 04/04/2022 11:27:26 AM By: Fredirick Maudlin MD FACS Entered By: Fredirick Maudlin on  04/04/2022 11:27:26 -------------------------------------------------------------------------------- Physical Exam Details Patient Name: Date of Service: [name] [name], [name] 04/04/2022 10:15 A M Medical Record Number: [id number] Patient Account Number: [id number] Date of Birth/Sex: Treating RN: September 06, 1970 (51 y.o. F) Primary Care Provider: [name] Other Clinician: Referring Provider: Treating Provider/Extender: Elyse Jarvis Weeks in Treatment: 50 Constitutional . . . . No acute  distress.Marland Kitchen Respiratory Normal work of breathing on room air.. Notes 04/04/2022: The wounds are a little bit less painful today and she is willing to entertain the possibility of debridement. The wounds measured a little bit larger in all dimensions today but the layer of slough is not as thick as usual. Electronic Signature(s) Signed: 04/04/2022 11:29:43 AM By: Fredirick Maudlin MD FACS Entered By: Fredirick Maudlin on 04/04/2022 11:29:43 -------------------------------------------------------------------------------- Physician Orders Details Patient Name: Date of Service: [name] [name], [name] 04/04/2022 10:15 A M Medical Record Number: [id number] Patient Account Number: [id number] Date of Birth/Sex: Treating RN: [birth year] (51 y.o. [name] Primary Care Provider: [name] Other Clinician: Referring Provider: Treating Provider/Extender: Elyse Jarvis Weeks in Treatment: 43 Verbal / Phone Orders: No Diagnosis Coding ICD-10 Coding Code Description L97.818 Non-pressure chronic ulcer of other part of right lower leg with other specified severity 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 Follow-up Appointments ppointment in 2 weeks. - Dr. Celine Ahr Room 3 Return A Anesthetic (In clinic) Topical Lidocaine 5% applied to wound bed - Used in clinic Bathing/ Shower/ Hygiene May shower with protection but do not get wound dressing(s) wet. - Can get cast  protector bags at [facility] or [facility] Edema Control - Lymphedema / SCD / Other Elevate legs to the level of the heart or above for 30 minutes daily and/or when sitting, a frequency of: - throughout the day Avoid standing for long periods of time. Exercise regularly Additional Orders / Instructions MALESSA, ZARTMAN ([id number]) [id number] Page 7 of 16 Follow Nutritious Diet Wound Treatment Wound #17 - Lower Leg Wound Laterality: Right, Lateral Cleanser: Soap and Water Every Other Day/30 Days Discharge Instructions: May shower and wash wound with dial antibacterial soap and water prior to dressing change. Cleanser: Wound Cleanser Every Other Day/30 Days Discharge Instructions: Cleanse the wound with wound cleanser prior to applying a clean dressing using gauze sponges, not tissue or cotton balls. Peri-Wound Care: Triamcinolone 15 (g) Every Other Day/30 Days Discharge Instructions: Use triamcinolone 15 (g) as directed Peri-Wound Care: Sween Lotion (Moisturizing lotion) Every Other Day/30 Days Discharge Instructions: Apply moisturizing lotion as directed Prim Dressing: MediHoney Gel, tube 1.5 (oz) Every Other Day/30 Days ary Discharge Instructions: Apply to wound bed as instructed Secondary Dressing: ABD Pad, 5x9 Every Other Day/30 Days Discharge Instructions: Apply over primary dressing as directed. Secondary Dressing: Zetuvit Plus 4x8 in Every Other Day/30 Days Discharge Instructions: Apply over primary dressing as directed. Secured With: Elastic Bandage 4 inch (ACE bandage) Every Other Day/30 Days Discharge Instructions: Secure with ACE bandage as directed. Compression Wrap: Kerlix Roll 4.5x3.1 (in/yd) Every Other Day/30 Days Discharge Instructions: Apply Kerlix and Coban compression as directed. Wound #21 - Ankle Wound Laterality: Right, Medial Cleanser: Soap and Water Every Other Day/30 Days Discharge Instructions: May shower and wash wound with dial  antibacterial soap and water prior to dressing change. Cleanser: Wound Cleanser Every Other Day/30 Days Discharge Instructions: Cleanse the wound with wound cleanser prior to applying a clean dressing using gauze sponges, not tissue or cotton balls. Peri-Wound Care: Triamcinolone 15 (g) Every Other Day/30 Days Discharge Instructions: Use triamcinolone 15 (g) as directed Peri-Wound Care: Sween Lotion (Moisturizing lotion) Every Other Day/30 Days Discharge Instructions: Apply moisturizing lotion as directed Prim Dressing: MediHoney Gel, tube 1.5 (oz) Every Other Day/30 Days ary Discharge Instructions: Apply to wound bed as instructed Secondary Dressing: ABD Pad, 5x9 Every Other Day/30 Days Discharge Instructions: Apply over primary dressing as directed. Secondary Dressing: Zetuvit Plus 4x8 in Every Other  Day/30 Days Discharge Instructions: Apply over primary dressing as directed. Secured With: Elastic Bandage 4 inch (ACE bandage) Every Other Day/30 Days Discharge Instructions: Secure with ACE bandage as directed. Compression Wrap: Kerlix Roll 4.5x3.1 (in/yd) Every Other Day/30 Days Discharge Instructions: Apply Kerlix and Coban compression as directed. Patient Medications llergies: No Known Allergies A Notifications Medication Indication Start End 04/04/2022 triamcinolone acetonide DOSE topical 0.1 % ointment - Apply to periwound skin with dressing changes Electronic Signature(s) Signed: 04/04/2022 11:31:16 AM By: Fredirick Maudlin MD FACS Entered By: Fredirick Maudlin on 04/04/2022 11:31:14 [name] ([id number]) [id number] Page 8 of 16 -------------------------------------------------------------------------------- Problem List Details Patient Name: Date of Service: [name] [name] 04/04/2022 10:15 A M Medical Record Number: [id number] Patient Account Number: [id number] Date of Birth/Sex: Treating RN: 10/27/1970 (51 y.o. F) Primary Care Provider:  [name] Other Clinician: Referring Provider: Treating Provider/Extender: Elyse Jarvis Weeks in Treatment: 55 Active Problems ICD-10 Encounter Code Description Active Date MDM Diagnosis L97.818 Non-pressure chronic ulcer of other part of right lower leg with other specified 04/17/2021 No Yes severity 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 04/17/2021 No Yes Inactive Problems ICD-10 Code Description Active Date Inactive Date L97.828 Non-pressure chronic ulcer of other part of left lower leg with other specified severity 04/17/2021 04/17/2021 Resolved Problems Electronic Signature(s) Signed: 04/04/2022 11:22:00 AM By: Fredirick Maudlin MD FACS Entered By: Fredirick Maudlin on 04/04/2022 11:22:00 -------------------------------------------------------------------------------- Progress Note Details Patient Name: Date of Service: [name] [name], [name] 04/04/2022 10:15 A M Medical Record Number: [id number] Patient Account Number: [id number] Date of Birth/Sex: Treating RN: 17-Feb-1971 (51 y.o. F) Primary Care Provider: [name] Other Clinician: Referring Provider: Treating Provider/Extender: Elyse Jarvis Weeks in Treatment: 74 Subjective Chief Complaint Information obtained from Patient the patient is here for evaluation of her bilateral lower extremity sickle cell ulcers 04/17/2021; patient comes in for substantial wounds on the right and left lower leg History of Present Illness (HPI) The following HPI elements were documented for the patient's wound: Location: medial and lateral ankle region on the right and left medial malleolus Quality: Patient reports experiencing a shooting pain to affected area(s). Severity: Patient states wound(s) are getting worse. Duration: right lower extremity bimalleolar ulcers have been present for approximately 2 years; the rright meedial malleolus ulcer has been there proximally 6 Scerbo, Tuleen  ([id number]) [id number] Page 9 of 16 months Timing: Pain in wound is constant (hurts all the time) Context: The wound would happen gradually Associated Signs and Symptoms: Patient reports having increase discharge. 51 year old patient with a history of sickle cell anemia who was last seen by me with ulceration of the right lower extremity above the ankle and was referred to Dr. Leland Johns for a surgical debridement as I was unable to do anything in the office due to excruciating pain. At that stage she was referred from the plastic surgery service to dermatology who treated her for a skin infection with doxycycline and then Levaquin and a local antibiotic ointment. I understand the patient has since developed ulceration on the left ankle both medial and lateral and was now referred back to the wound center as dermatology has finished the management. I do not have any notes from the dermatology department Old notes: 51 year old patient with a history of sickle cell anemia, pain bilateral lower extremities, right lower extremity ulcer and has a history of receiving a skin graft( Theraskin) several months ago. She has been visiting the wound center [facility] and was seen by Dr. Dellia Nims and  Dr. Leland Johns. after prolonged conservator therapy between July 2016 and January 2017. She had been seen by the plastic surgeon and taken to the OR for debridement and application of Theraskin. She had 3 applications of Theraskin and was then treated with collagen. Prior to that she had a history of similar problems in 2014 and was treated conservatively. Had a reflux study done for the right lower extremity in August 2016 without reflux or DVT . Past medical history significant for sickle cell disease, anemia, leg ulcers, cholelithiasis,and has never been a smoker. Once the patient was discharged on the wound center she says within 2 or 3 weeks the problems recurred and she has been  treating it conservatively. since I saw her 3 weeks ago at [facility] she has been unable to get her dressing material but has completed a course of doxycycline. 6/7/ 2017 -- lower extremity venous duplex reflux evaluation was done oo No evidence of SVT or DVT in the RLL. No venous incompetence in the RLL. No further vascular workup is indicated at this time. She was seen by Dr. [name], on 10/04/2015. She agreed with the plan of taking her to the OR for debridement and application of theraskin and would also take biopsies to rule out pyoderma gangrenosum. Follow-up note dated May 31 received and she was status post application of Theraskin to multiple ulcers around the right ankle. Pathology did not show evidence of malignancy or pyoderma gangrenosum. She would continue to see as in the wound clinic for further care and see Dr. Leland Johns as needed. The patient brought the biopsy report and it was consistent with stasis ulcer no evidence of malignancy and the comment was that there was some adjacent neovascularization, fibrosis and patchy perivascular chronic inflammation. 11/15/2015 -- today we applied her first application of Theraskin 11/30/15; TheraSkin #2 12/13/2015 -- she is having a lot of pain locally and is here for possible application of a theraskin today. 01/16/2016 -- the patient has significant pain and has noticed despite in spite of all local care and oral pain medication. It is impossible to debride her in the office. 02/06/2016 -- I do not see any notes from Dr. Iran Planas( the patient has not made a call to the office know as she heard from them) and the only visit to recently was with her PCP Dr. Danella Penton -- I saw her on 01/16/2016 and prescribed 90 tablets of oxycodone 10 mg and did lab work and screening for HIV. the HIV was negative and hemoglobin was 6.3 with a WBC count of 14.9 and hematocrit of 17.8 with platelets of 561. reticulocyte count was  15.5% READMISSION: 07/10/2016- The patient is here for readmission for bilateral lower extremity ulcers in the presence of sickle cell. The bimalleolar ulcers to the right lower extremity have been present for approximately 2 years, the left medial malleolus ulcer has been present approximately 6 months. She has followed with Dr.Thimmappa in the past and has had a total of 3 applications of Theraskin (01/2015, 09/2015, 06/17/16). She has also followed with Dr. Con Memos here in the clinic and has received 2 applications of TheraSkin (11/10/15, 11/30/15). The patient does experience chronic, and is not amenable to debridement. 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 She is not currently on any antibiotic therapy and has not been treated with any recently. 07/17/2016 -- was seen by Dr. Iran Planas of plastic surgery who saw her 2 weeks postop application of  Theraskin #3. She had removed her dressing and asked her to apply silver alginate on alternate days and follow-up back with the wound center. Future debridements and application of skin substitute would have to be done in the hospital due to her high risk for anesthesia. READMISSION 04/17/2021 Patient is now a 51 year old woman that we have had in this clinic for a prolonged period of time and 2016-2017 and then again for 2 visits in February 2018. At that point she had wounds on the right lower leg predominantly medial. She had also been seen by plastic surgery Dr. Leland Johns who I believe took her to the OR for operative debridement and application of TheraSkin in 2017. After she left our clinic she was followed for a very prolonged period of time in the wound care center in [facility] who then referred her ultimately to [facility] where she was seen by Dr. Vernona Rieger. Again taken her to the OR for skin grafting which apparently did not take. She had multiple other attempts at dressings although I have not really looked  over all of these notes in great detail. She has not been seen in a wound care center in about a year. She states over the last year in addition to her right lower leg she has developed wounds on the left lower leg quite extensive. She is using Xeroform to all of these wounds without really any improvement. She also has Medicaid which does not cover wound products. The patient has had vascular work-ups in the past including most recently on 03/28/2021 showing biphasic waveforms on the right triphasic at the PTA and biphasic at the dorsalis pedis on the left. She was unable to tolerate any degree of compression to do ABIs. Unfortunately TBI's were also not done. She had venous reflux studies done in 2017. This did not show any evidence of a DVT or SVT and no venous incompetence was noted in the right leg at the time this was the only side with the wound As noted I did not look all over her old records. She apparently had a course of HBO and [facility] although I am not sure what the indication would have been. In any case she developed seizures and terminated treatment earlier. She is generally much more disabled than when we last saw her in clinic. She can no longer walk pretty much wheelchair-bound because predominantly of pain in the left hip. 04/24/2021; the patient tolerated the wraps we put on. We used Santyl and Hydrofera Blue under compression. I brought her back for a nurse visit for a change in dressing. With Medicaid we will have a hard time getting anything paid for and hence the need for compression. She arrives in clinic with all the wounds looking somewhat better in terms of surface 12/20; circumferential wound on the right from the lateral to the medial. She has open areas on the left medial and left lateral x2 on all of this with the same surface. This does not look completely healthy although she does have some epithelialization. She is not complaining of a lot of pain which is unusual for  her sickle ulcers. I have not looked over her extensive records from [facility]. She had recent arterial studies and has a history of venous reflux studies I will need to look these over although I do not believe she has significant arterial disease 2023 05/22/2021; patient's wound areas measure slightly smaller. Still a lot of drainage coming from the right we have been using Hydrofera  Blue and Santyl with some improvement in the wound surfaces. She tells me she will be getting transfused later in the week for her underlying sickle cell anemia I have looked over her recent arterial studies which were done in the fall. This was in November and showed biphasic and triphasic waveforms but she could not tolerate ABIs because of pressure and unfortunately TBI's were not done. She has not had recent venous reflux studies that I can see 1/10; not much change about the same surface area. This has a yellowish surface to it very gritty. We have been using Santyl and [facility] for a Kenedy, Legrand Rams ([id number]) [id number] Page 10 of 16 prolonged period. Culture I did last week showed methicillin sensitive staph aureus "rare". Our intake nurse reports greenish drainage which may be the Hydrofera Blue itself 1/17; wounds are continue to measure smaller although I am not sure about the accuracy here. Especially the areas on the right are covered in what looks to be a nonviable surface although she does have some epithelialization. Similarly she has areas on the left medial and left lateral ankle area which appear to have a better surface and perhaps are slightly smaller. We have been using Santyl and Hydrofera Blue. She cannot tolerate mechanical debridements She went for her reflux studies which showed significant reflux at the greater saphenous vein at the saphenofemoral junction as well as the greater saphenous vein in the proximal calf on the left she had reflux in the thigh and  the common femoral vein and supra vein Fishel vein reflux in the greater saphenous vein. I will have vein and vascular look at this. My thoughts have been that these are likely sickle wounds. I looked through her old records from [facility] wound care center and then when she graduated to [facility] wound care center where she saw Dr. Zigmund Daniel and Dr. Vernona Rieger. Although I can see she had reflux studies done I do not see that she actually saw a vein and vascular. I went over the fact that she had operative debridements and actual skin grafting that did not take. I do not think these wounds have ever really progressed towards healing 1/31;Substantial wounds on the right ankle area. Hyper granulated very gritty adherent debris on the surface. 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 On the left she has no DVT or SVT deep vein reflux in the common femoral vein. Superficial vein reflux in the greater saphenous vein on although the vein diameter is not really all that large. I do not think there is anything that can be done with these although I am going to send her for consultation to vein and vascular. 2/7; Wound exam; substantial wound area on the right posterior ankle area and areas on the left medial ankle and left lateral ankle. I was able to debride the left medial ankle last week fairly aggressively and it is back this week to a completely nonviable surface She will see vascular surgery this Friday and I would like them to review the venous studies and also any comments on her arterial status. If they do not  see an issue here I am going to  refer her to plastic surgery for an operative debridement perhaps intraoperative ACell or Integra. Eventually she will require a deep tissue culture again 2/14; substantial wound area on the right posterior ankle, medial ankle. We have been using silver alginate The patient was seen by vein and vascular she had both venous reflux studies and arterial studies. 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 She had no evidence of superficial venous thrombosis. She did have superficial vein reflux at the saphenofemoral junction and the greater saphenous vein. On the left no evidence of a DVT no evidence of superficial venous throat thrombosis she did have deep vein reflux in the common femoral vein and superficial vein reflux in the greater saphenous vein but these were not felt to be amenable to ablation. In terms of arterial studies she had triphasic and wife biphasic wave waveforms bilaterally not felt to have a significant arterial issue. I do not get the feeling that they felt that any part of her nonhealing wounds were related to either arterial or venous issues. They did note that she had venous reflux at the right at the [facility] and [facility]. And also on the left there were reflux in the deep system at the common femoral vein and greater saphenous vein in the proximal thigh. Nothing amenable to ablation. 2/20; she is making some decent progress on the right where there is nice skin between the 2 open areas on the right ankle. The surfaces here do not look viable yet there is some surrounding epithelialization. She still has a small area on the left medial ankle area. Hyper-granulated [name] 2/28 patient has an appointment with plastic surgery on 3/8. We will see her back on 3/9. She may have to call us to get the area redressed. We've been using Santyl under silver alginate. We made a nice improvement on the left medial  ankle. The larger wounds on the right also looks somewhat better in terms of epithelialization although I think they could benefit from an aggressive debridement if plastic surgery would be willing to do that. Perhaps placement of Integra or a cell 07/26/2021: She saw Dr. Claudia Desanctis yesterday. He raised the question as to whether or not this might be pyoderma and wanted to wait until that question was answered by dermatology before proceeding with any sort of operative debridement. We have continue to use Santyl under silver alginate with Kerlix and Coban wraps. Overall, her wounds appear to be continuing to contract and epithelialize, with some granulation tissue present. There continues to be some slough on all wound surfaces. 08/09/2021: She has not been able to get an appointment with dermatology because apparently the offices in [place] do not accept Medicaid. She is looking into whether or not she can be seen at the main [facility] dermatology clinic. This is necessary because plastic surgery is concerned that her wounds might represent pyoderma and they did not want to do any procedure until that was clarified. We have been using Santyl under silver alginate with Kerlix and Coban wraps. Today, there was a greater amount of drainage on her dressings with a slight green discoloration and significant odor. Despite this, her wounds continue to contract and epithelialize. There is pale granulation tissue present and actually, on the left medial ankle, the granulation tissue is a bit hypertrophic. 08/16/2021: Last week,  I took a culture and this grew back rare methicillin-resistant Staph aureus and rare corynebacterium. The MRSA was sensitive to gentamicin which we began applying topically on an empiric basis. This week, her wounds are a bit smaller and the drainage and odor are less. Her primary care provider is working on assisting the patient with a dermatology evaluation. She has  been in silver alginate over the gentamicin that was started last week along with Kerlix and Coban wraps. 08/23/2021: Because she has Medicaid, we have been unable to get her into see any dermatologist in the [place] to rule out pyoderma gangrenosum, which was a requirement from plastic surgery prior to any sort of debridement and grafting. Despite this, however, all of her wounds continue to get smaller. The wound on her left medial ankle is nearly closed. There is no odor from the wounds, although she still accumulates a modest amount of drainage on her dressings. 08/30/2021: The lateral right ankle wound and the medial left ankle wound are a bit smaller today. The medial right ankle wound is about the same size. They are less tender. We have still been unable to get her into dermatology. 09/06/2021: All of the wounds are about the same size today. She continues to endorse minimal pain. I communicated with Dr. Claudia Desanctis in plastic surgery regarding our issues getting a dermatology appointment; he was out of town but indicated that he would look into perhaps performing the biopsy in his office and will have his office contact her. 09/14/2021: The patient has an appointment in dermatology, but it is not until October. Her wounds are roughly the same; she continues to have very thick purulent-looking drainage on her dressings. 09/20/2021: The left medial wound is nearly closed and just has a bit of accumulated eschar on the surface. The right medial and lateral ankle wounds are perhaps a little bit smaller. They continue to have a very pale surface with accumulation of thin slough. 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 I did not think [employer] was indicated based on this. She is getting topical mupirocin with Prisma silver collagen. 10/04/2021: The patient was not seen in clinic last week due to childcare coverage issues. In the interim, the left medial leg wound has closed. The right  sided leg wounds are smaller. There is more granulation tissue coming through, particularly on the lateral wound. The surface remains somewhat gritty. We have been applying topical mupirocin and Prisma silver collagen. 10/11/2021: The left medial leg wound remains closed. She does complain of some anesthetic sensation to the area. Both of the right-sided leg wounds are smaller but still have accumulated slough. 10/18/2021: Both right-sided leg wounds are minimally smaller this week. She still continues to accumulate slough and has thick drainage on her dressings. 10/23/2021: Both wounds continue to contract. There is still slough buildup. She has been approved for a keratin-based skin substitute trial product but it will not [name], [name] ([id number]) [id number] Page 11 of 16 be available until next week. 10/30/2021: The wounds are about the same to perhaps slightly smaller. There is still continued slough buildup. Unfortunately, the rep for the keratin based product did not show up today and did not answer his phone when called. 11/08/2021: The wounds are little bit smaller today. She continues to have thick drainage but the surfaces are relatively clean with just a little bit of slough accumulation. She reported to me today that she is unable to completely flex her left ankle and  on examination it seems this is potentially related to scar tissue from her wounds. We do have the ProgenaMatrix trial product available for her today. 11/15/2021: Both wounds are smaller today. There is some slough accumulation on the surfaces, but the medial wound, in particular looks like it is filling in and is less deep. She did hear from physical therapy and she is going to start working with them on July 11. She is here for her second application of the trial skin substitute, ProgenaMatrix. 11/22/2021: Both wounds continue to contract, the medial more dramatically than the lateral. Both wounds  have a layer of slough on the surface, but underneath this, the gritty fibrous tissue has a little bit more of a pink cast to it rather than being as pale as it has been. 11/29/2021: The wounds are roughly the same size this week, perhaps a millimeter or 2 smaller. The medial wound has filled in and is nearly flush with the surrounding skin surface. She continues to have a lot of slough accumulation on both surfaces. 12/06/2021: No significant change to her wounds, but she has a new opening on her dorsal foot, just distal to the right lateral ankle wound. The area on her left medial ankle that reopened looks a little bit larger today. She has quite a bit of pain associated with the new wound. 12/12/2021: Her wounds look about the same but the new opening on her right lateral dorsal foot is a little bit bigger. She continues to have a fair amount of pain with this wound. 12/26/2021: The left medial ankle wound is tiny and superficial. She has 2 areas of crusting on her left lateral ankle, however, that appear to be threatening to open again. Her right medial ankle wound is a little bit smaller today but still continues to accumulate thick rubbery slough. The new dorsal foot wound is exquisitely painful but there is no odor or purulent drainage. No erythema or induration. The right lateral ankle wound looks about the same today, again with thick rubbery slough. 01/03/2022: The left medial ankle wound has closed again. Both right ankle wounds appear to be about the same size with thick rubbery slough. The dorsal foot wound on the right continues to be quite painful and she stated that she did not want any debridement of that site today. 01/10/2022: No real change to any of her wounds. She continues to accumulate thick slough. The dorsal foot wound has merged with the lateral malleolar wound. She is experiencing significant pain in the dorsal foot portion of the ulcer. 01/16/2022: Absolutely no change or  progress in her wounds. 01/24/2022: Her wounds are unchanged. She continues to build up slough and the wounds on her dorsal right foot are still exquisitely tender. 01/31/2022: The wounds actually measure a little bit narrower today. They still have thick slough on the surface but the underlying tissue seems a little less fibrotic. We changed to Iodosorb last week. 02/07/2022: Wounds continue to slowly epithelialize around the parameters. She has less pain today. She still accumulates a fairly substantial layer of slough. 02/15/2022: No change to the wounds overall. The measured a little bit larger today per the intake nurse. She continues to have substantial slough accumulation and her pain is a little bit worse. 02/21/2022. No change at all to her wounds. I do not see that plastic surgery has received her referral yet. 02/28/2022: The wounds remain unchanged. They are dry and fibrotic with accumulation of slough and eschar. She did receive  an appointment to see plastic surgery on October 23, but the office called her back and indicated they needed to reschedule and that the next available appointment was not until December. The patient became angry and decided she did not want to see this plastics group. 10/19; the patient sees Dr. Lovett Calender again next week. She is using Medihoney as the primary dressing changing this herself. 03/21/2022: She saw Dr. Jaclynn Guarneri at the wound care center at [facility]. Dr. Jaclynn Guarneri was also unable to debride her, secondary to pain and is planning to take her to the operating room for operative debridement and potential skin substitute placement. Her wounds are unchanged with thick yellow slough and a fibrotic base. She says they are too painful to be debrided. In addition, yesterday her hemoglobin was 4 and she received 2 units packed red blood cell transfusion. 04/04/2022: Her operation at [facility] is scheduled for December 15. She continues to use  Medihoney on her wounds. They are a little bit less painful today and she is willing to entertain the possibility of debridement. The wounds measured a little bit larger in all dimensions today but the layer of slough is not as thick as usual. Patient History Information obtained from Patient. Family History Diabetes - Mother, Lung Disease - Mother, No family history of Cancer, Heart Disease, Hereditary Spherocytosis, Hypertension, Kidney Disease, Seizures, Stroke, Thyroid Problems, Tuberculosis. Social History Never smoker, Marital Status - Married, Alcohol Use - Never, Drug Use - No History, Caffeine Use - Daily. Medical History Eyes Denies history of Cataracts, Glaucoma, Optic Neuritis Ear/Nose/Mouth/Throat Denies history of Chronic sinus problems/congestion, Middle ear problems Hematologic/Lymphatic Patient has history of Anemia, Sickle Cell Disease Denies history of Hemophilia, Human Immunodeficiency Virus, Lymphedema Respiratory Denies history of Aspiration, Asthma, Chronic Obstructive Pulmonary Disease (COPD), Pneumothorax, Sleep Apnea, Tuberculosis Cardiovascular Denies history of Angina, Arrhythmia, Congestive Heart Failure, Coronary Artery Disease, Deep Vein Thrombosis, Hypertension, Hypotension, Myocardial Infarction, Peripheral Arterial Disease, Peripheral Venous Disease, Phlebitis, Vasculitis Gastrointestinal Denies history of Cirrhosis , Colitis, Crohnoos, Hepatitis A, Hepatitis B, Hepatitis C Endocrine ELEORA, [name] ([id number]) [id number] Page 12 of 16 Denies history of Type I Diabetes, Type II Diabetes Genitourinary Denies history of End Stage Renal Disease Immunological Denies history of Lupus Erythematosus, Raynaudoos, Scleroderma Integumentary (Skin) Denies history of History of Burn Musculoskeletal Denies history of Gout, Rheumatoid Arthritis, Osteoarthritis, Osteomyelitis Neurologic Patient has history of Neuropathy - right  foot intermittant Denies history of Dementia, Quadriplegia, Paraplegia, Seizure Disorder Oncologic Denies history of Received Chemotherapy, Received Radiation Psychiatric Denies history of Anorexia/bulimia, Confinement Anxiety Hospitalization/Surgery History - c section x2. - left breast lumpectomy. - iandD right ankle with theraskin. Medical A Surgical History Notes nd Constitutional Symptoms (General Health) H/O miscarriage Cardiovascular bradycardia Gastrointestinal cholilithiasis Objective Constitutional No acute distress.. Vitals Time Taken: 10:46 AM, Height: 67 in, Temperature: 97.9 F, Pulse: 72 bpm, Respiratory Rate: 16 breaths/min, Blood Pressure: 130/75 mmHg. Respiratory Normal work of breathing on room air.. General Notes: 04/04/2022: The wounds are a little bit less painful today and she is willing to entertain the possibility of debridement. The wounds measured a little bit larger in all dimensions today but the layer of slough is not as thick as usual. Integumentary (Hair, Skin) Wound #17 status is Open. Original cause of wound was Gradually Appeared. The date acquired was: 10/05/2012. The wound has been in treatment 50 weeks. The wound is located on the Right,Lateral Lower Leg. The wound measures 7cm length x 5cm width x 0.1cm depth; 27.489cm^2  area and 2.749cm^3 volume. There is Fat Layer (Subcutaneous Tissue) exposed. There is no tunneling or undermining noted. There is a large amount of serosanguineous drainage noted. The wound margin is distinct with the outline attached to the wound base. There is small (1-33%) pink, pale granulation within the wound bed. There is a large (67-100%) amount of necrotic tissue within the wound bed including Adherent Slough. The periwound skin appearance had no abnormalities noted for texture. The periwound skin appearance had no abnormalities noted for moisture. The periwound skin appearance had no abnormalities noted for color.  Periwound temperature was noted as No Abnormality. Wound #21 status is Open. Original cause of wound was Gradually Appeared. The date acquired was: 06/26/2021. The wound has been in treatment 40 weeks. The wound is located on the Right,Medial Ankle. The wound measures 7.2cm length x 3.5cm width x 0.1cm depth; 19.792cm^2 area and 1.979cm^3 volume. There is Fat Layer (Subcutaneous Tissue) exposed. There is no tunneling or undermining noted. There is a medium amount of serosanguineous drainage noted. The wound margin is distinct with the outline attached to the wound base. There is small (1-33%) pink granulation within the wound bed. There is a large (67- 100%) amount of necrotic tissue within the wound bed including Adherent Slough. The periwound skin appearance had no abnormalities noted for texture. The periwound skin appearance had no abnormalities noted for moisture. The periwound skin appearance had no abnormalities noted for color. Periwound temperature was noted as No Abnormality. 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 There was a Selective/Open Wound Non-Viable Tissue Debridement with a total area of 31.5 sq cm performed by Fredirick Maudlin, MD. With the following instrument(s): Curette to remove Non-Viable tissue/material. Material removed includes [facility] after achieving pain control using Lidocaine 4% Topical Solution. No specimens were taken. A time out was conducted at 10:55, prior to the start of the procedure. A Minimum amount of bleeding was controlled with Pressure. The procedure was tolerated well with a pain level of 0 throughout and a pain level of 0 following  the procedure. Post Debridement Measurements: 7cm length x 5cm width x 0.1cm depth; 2.749cm^3 volume. Character of Wound/Ulcer Post Debridement is improved. Post procedure Diagnosis Wound #17: Same as Pre-Procedure General Notes: Scribed for Dr. Celine Ahr by J.[name]. Wound #21 Pre-procedure diagnosis of Wound #21 is a Sickle Cell Lesion located on the Right,Medial Ankle .Severity of Tissue Pre Debridement is: Fat layer exposed. There was a Selective/Open Wound Non-Viable Tissue Debridement with a total area of 22.75 sq cm performed by Fredirick Maudlin, MD. With the following instrument(s): Curette to remove Non-Viable tissue/material. Material removed includes [facility] after achieving pain control using Lidocaine 4% Topical Solution. No specimens were taken. A time out was conducted at 10:55, prior to the start of the procedure. A Minimum amount of bleeding was controlled with Pressure. The procedure was tolerated well with a pain level of 0 throughout and a pain level of 0 following the procedure. Post Debridement Measurements: 7.2cm length x 3.5cm width x 0.1cm depth; 1.979cm^3 volume. Character of Wound/Ulcer Post Debridement is improved. Severity of Tissue Post Debridement is: Fat layer exposed. Post procedure Diagnosis Wound #21: Same as Pre-Procedure General Notes: Scribed for Dr. Celine Ahr by J.[name]. Plan Follow-up Appointments: Return Appointment in  2 weeks. - Dr. Celine Ahr Room 3 Anesthetic: (In clinic) Topical Lidocaine 5% applied to wound bed - Used in clinic Bathing/ Shower/ Hygiene: May shower with protection but do not get wound dressing(s) wet. - Can get cast protector bags at [facility] or [facility] Edema Control - Lymphedema / SCD / Other: Elevate legs to the level of the heart or above for 30 minutes daily and/or when sitting, a frequency of: - throughout the day Avoid standing for long periods of time. Exercise regularly Additional Orders / Instructions: Follow Nutritious Diet The  following medication(s) was prescribed: triamcinolone acetonide topical 0.1 % ointment Apply to periwound skin with dressing changes starting 04/04/2022 WOUND #17: - Lower Leg Wound Laterality: Right, Lateral Cleanser: Soap and Water Every Other Day/30 Days Discharge Instructions: May shower and wash wound with dial antibacterial soap and water prior to dressing change. Cleanser: Wound Cleanser Every Other Day/30 Days Discharge Instructions: Cleanse the wound with wound cleanser prior to applying a clean dressing using gauze sponges, not tissue or cotton balls. Peri-Wound Care: Triamcinolone 15 (g) Every Other Day/30 Days Discharge Instructions: Use triamcinolone 15 (g) as directed Peri-Wound Care: Sween Lotion (Moisturizing lotion) Every Other Day/30 Days Discharge Instructions: Apply moisturizing lotion as directed Prim Dressing: MediHoney Gel, tube 1.5 (oz) Every Other Day/30 Days ary Discharge Instructions: Apply to wound bed as instructed Secondary Dressing: ABD Pad, 5x9 Every Other Day/30 Days Discharge Instructions: Apply over primary dressing as directed. Secondary Dressing: Zetuvit Plus 4x8 in Every Other Day/30 Days Discharge Instructions: Apply over primary dressing as directed. Secured With: Elastic Bandage 4 inch (ACE bandage) Every Other Day/30 Days Discharge Instructions: Secure with ACE bandage as directed. Com pression Wrap: Kerlix Roll 4.5x3.1 (in/yd) Every Other Day/30 Days Discharge Instructions: Apply Kerlix and Coban compression as directed. WOUND #21: - Ankle Wound Laterality: Right, Medial Cleanser: Soap and Water Every Other Day/30 Days Discharge Instructions: May shower and wash wound with dial antibacterial soap and water prior to dressing change. Cleanser: Wound Cleanser Every Other Day/30 Days Discharge Instructions: Cleanse the wound with wound cleanser prior to applying a clean dressing using gauze sponges, not tissue or cotton balls. Peri-Wound Care:  Triamcinolone 15 (g) Every Other Day/30 Days Discharge Instructions: Use triamcinolone 15 (g) as directed Peri-Wound Care: Sween Lotion (Moisturizing lotion) Every Other Day/30 Days Discharge Instructions: Apply moisturizing lotion as directed Prim Dressing: MediHoney Gel, tube 1.5 (oz) Every Other Day/30 Days ary Discharge Instructions: Apply to wound bed as instructed Secondary Dressing: ABD Pad, 5x9 Every Other Day/30 Days Discharge Instructions: Apply over primary dressing as directed. Secondary Dressing: Zetuvit Plus 4x8 in Every Other Day/30 Days Discharge Instructions: Apply over primary dressing as directed. Secured With: Elastic Bandage 4 inch (ACE bandage) Every Other Day/30 Days Discharge Instructions: Secure with ACE bandage as directed. Com pression Wrap: Kerlix Roll 4.5x3.1 (in/yd) Every Other Day/30 Days Discharge Instructions: Apply Kerlix and Coban compression as directed. 04/04/2022: The wounds are a little bit less painful today and she is willing to entertain the possibility of debridement. The wounds measured a little bit larger in all dimensions today but the layer of slough is not as thick as usual. I used a curette to debride slough off of the wound surfaces. She permitted about 94% of the wound surfaces to be debrided. We will continue Medihoney as recommended by the surgeon who is going to perform her debridement in December. I also refilled her triamcinolone ointment. She will follow-up in 2 weeks. [name], [name] ([id number]) [id number] Page 14 of  16 Electronic Signature(s) Signed: 04/04/2022 11:32:16 AM By: Fredirick Maudlin MD FACS Entered By: Fredirick Maudlin on 04/04/2022 11:32:15 -------------------------------------------------------------------------------- HxROS Details Patient Name: Date of Service: [name] [name], [name] 04/04/2022 10:15 A M Medical Record Number: [id number] Patient Account Number: [id number] Date of Birth/Sex:  Treating RN: [birth year] (51 y.o. F) Primary Care Provider: [name] Other Clinician: Referring Provider: Treating Provider/Extender: Elyse Jarvis Weeks in Treatment: 74 Information Obtained From Patient Constitutional Symptoms (General Health) Medical History: Past Medical History Notes: H/O miscarriage Eyes Medical History: Negative for: Cataracts; Glaucoma; Optic Neuritis Ear/Nose/Mouth/Throat Medical History: Negative for: Chronic sinus problems/congestion; Middle ear problems Hematologic/Lymphatic Medical History: Positive for: Anemia; Sickle Cell Disease Negative for: Hemophilia; Human Immunodeficiency Virus; Lymphedema Respiratory Medical History: Negative for: Aspiration; Asthma; Chronic Obstructive Pulmonary Disease (COPD); Pneumothorax; Sleep Apnea; Tuberculosis Cardiovascular Medical History: Negative for: Angina; Arrhythmia; Congestive Heart Failure; Coronary Artery Disease; Deep Vein Thrombosis; Hypertension; Hypotension; Myocardial Infarction; Peripheral Arterial Disease; Peripheral Venous Disease; Phlebitis; Vasculitis Past Medical History Notes: bradycardia Gastrointestinal Medical History: Negative for: Cirrhosis ; Colitis; Crohns; Hepatitis A; Hepatitis B; Hepatitis C Past Medical History Notes: cholilithiasis Endocrine Medical History: Negative for: Type I Diabetes; Type II Diabetes Genitourinary Medical History: Negative for: End Stage Renal Disease [name], [name] ([id number]) [id number] Page 15 of 16 Immunological Medical History: Negative for: Lupus Erythematosus; Raynauds; Scleroderma Integumentary (Skin) Medical History: Negative for: History of Burn Musculoskeletal Medical History: Negative for: Gout; Rheumatoid Arthritis; Osteoarthritis; Osteomyelitis Neurologic Medical History: Positive for: Neuropathy - right foot intermittant Negative for: Dementia; Quadriplegia; Paraplegia;  Seizure Disorder Oncologic Medical History: Negative for: Received Chemotherapy; Received Radiation Psychiatric Medical History: Negative for: Anorexia/bulimia; Confinement Anxiety Immunizations Pneumococcal Vaccine: Received Pneumococcal Vaccination: No Implantable Devices None Hospitalization / Surgery History Type of Hospitalization/Surgery c section x2 left breast lumpectomy iandD right ankle with theraskin Family and Social History Cancer: No; Diabetes: Yes - Mother; Heart Disease: No; Hereditary Spherocytosis: No; Hypertension: No; Kidney Disease: No; Lung Disease: Yes - Mother; Seizures: No; Stroke: No; Thyroid Problems: No; Tuberculosis: No; Never smoker; Marital Status - Married; Alcohol Use: Never; Drug Use: No History; Caffeine Use: Daily; Financial Concerns: No; Food, Clothing or Shelter Needs: No; Support System Lacking: No; Transportation Concerns: No Electronic Signature(s) Signed: 04/04/2022 12:38:27 PM By: Fredirick Maudlin MD FACS Entered By: Fredirick Maudlin on 04/04/2022 11:27:33 -------------------------------------------------------------------------------- SuperBill Details Patient Name: Date of Service: [place], [name] 04/04/2022 Medical Record Number: [id number] Patient Account Number: [id number] Date of Birth/Sex: Treating RN: Mar 14, 1971 (51 y.o. F) Primary Care Provider: [name] Other Clinician: Referring Provider: Treating Provider/Extender: Elyse Jarvis Weeks in Treatment: 50 Diagnosis Coding ICD-10 Codes Code Description [name], [name] ([id number]) [id number] Page 16 of 16 L97.818 Non-pressure chronic ulcer of other part of right lower leg with other specified severity 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 Facility Procedures : CPT4 Code: 51761607 Description: [phone] - DEBRIDE WOUND 1ST 20 SQ CM OR < ICD-10 Diagnosis Description L97.818 Non-pressure chronic ulcer of other part of  right lower leg with other specified s Modifier: everity Quantity: 1 : CPT4 Code: 26948546 Description: 27035 - DEBRIDE WOUND EA ADDL 20 SQ CM ICD-10 Diagnosis Description L97.818 Non-pressure chronic ulcer of other part of right lower leg with other specified s Modifier: everity Quantity: 2 Physician Procedures : CPT4 Code Description Modifier [id number] - WC PHYS LEVEL 4 - EST PT 25 ICD-10 Diagnosis Description L97.818 Non-pressure chronic ulcer of other part of right lower leg with other specified severity 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 Quantity:  1 : [id number] - WC PHYS DEBR WO ANESTH 20 SQ CM ICD-10 Diagnosis Description L97.818 Non-pressure chronic ulcer of other part of right lower leg with other specified severity Quantity: 1 : [id number] 73958 - WC PHYS DEBR WO ANESTH EA ADD 20 CM ICD-10 Diagnosis Description L97.818 Non-pressure chronic ulcer of other part of right lower leg with other specified severity Quantity: 2 Electronic Signature(s) Signed: 04/04/2022 11:32:37 AM By: Fredirick Maudlin MD FACS Entered By: Fredirick Maudlin on 04/04/2022 11:32:37

## 2022-04-05 NOTE — Progress Notes (Signed)
[name], [name] ([id number]) [id number] Page 1 of 9 Visit Report for 04/04/2022 Arrival Information Details Patient Name: Date of Service: [name] [name] 04/04/2022 10:15 A M Medical Record Number: [id number] Patient Account Number: [id number] Date of Birth/Sex: Treating RN: 1970-09-13 (51 y.o. [name] Primary Care Provider: [name] Other Clinician: Referring Provider: Treating Provider/Extender: Elyse Jarvis Weeks in Treatment: 40 Visit Information History Since Last Visit Added or deleted any medications: No Patient Arrived: [name] Any new allergies or adverse reactions: No Arrival Time: 10:45 Had a fall or experienced change in No Accompanied By: self activities of daily living that may affect Transfer Assistance: None risk of falls: Patient Identification Verified: Yes Signs or symptoms of abuse/neglect since last visito No Patient Requires Transmission-Based Precautions: No Hospitalized since last visit: No Patient Has Alerts: No Implantable device outside of the clinic excluding No cellular tissue based products placed in the center since last visit: Has Dressing in Place as Prescribed: Yes Has Compression in Place as Prescribed: Yes Pain Present Now: Yes Electronic Signature(s) Signed: 04/05/2022 7:55:48 AM By: Dellie Catholic RN Entered By: Dellie Catholic on 04/04/2022 11:04:14 -------------------------------------------------------------------------------- Encounter Discharge Information Details Patient Name: Date of Service: [name], [name] 04/04/2022 10:15 A M Medical Record Number: [id number] Patient Account Number: [id number] Date of Birth/Sex: Treating RN: 11/14/1970 (51 y.o. [name] Primary Care Provider: [name] Other Clinician: Referring Provider: Treating Provider/Extender: Elyse Jarvis Weeks in Treatment: 73 Encounter Discharge Information Items  Post Procedure Vitals Discharge Condition: Stable Temperature (F): 97.9 Ambulatory Status: Cane Pulse (bpm): 72 Discharge Destination: Home Respiratory Rate (breaths/min): 16 Transportation: Private Auto Blood Pressure (mmHg): 130/75 Accompanied By: self Schedule Follow-up Appointment: Yes Clinical Summary of Care: Patient Declined Electronic Signature(s) Signed: 04/05/2022 7:55:48 AM By: Dellie Catholic RN Entered By: Dellie Catholic on 04/05/2022 07:55:29 [name] ([id number]) [id number] Page 2 of 9 -------------------------------------------------------------------------------- Lower Extremity Assessment Details Patient Name: Date of Service: [name] [name] 04/04/2022 10:15 A M Medical Record Number: [id number] Patient Account Number: [id number] Date of Birth/Sex: Treating RN: 06-12-1970 (51 y.o. [name] Primary Care Provider: [name] Other Clinician: Referring Provider: Treating Provider/Extender: Elyse Jarvis Weeks in Treatment: 50 Edema Assessment Assessed: [Left: No] [Right: No] Edema: [Left: Ye] [Right: s] Calf Left: Right: Point of Measurement: 33 cm From Medial Instep 28 cm 32 cm Ankle Left: Right: Point of Measurement: 10 cm From Medial Instep 18.9 cm 21 cm Vascular Assessment Pulses: Dorsalis Pedis Palpable: [Left:Yes] [Right:Yes] Electronic Signature(s) Signed: 04/05/2022 7:55:48 AM By: Dellie Catholic RN Entered By: Dellie Catholic on 04/04/2022 11:06:52 -------------------------------------------------------------------------------- Multi Wound Chart Details Patient Name: Date of Service: [name], [name] 04/04/2022 10:15 A M Medical Record Number: [id number] Patient Account Number: [id number] Date of Birth/Sex: Treating RN: 03-May-1971 (51 y.o. F) Primary Care Provider: [name] Other Clinician: Referring Provider: Treating Provider/Extender: Elyse Jarvis Weeks in Treatment: 50 Vital Signs Height(in): 67 Pulse(bpm): 72 Weight(lbs): Blood Pressure(mmHg): 130/75 Body Mass Index(BMI): Temperature(F): 97.9 Respiratory Rate(breaths/min): 16 [17:Photos:] [N/A:N/A [id number] Page 3 of 9] Right, Lateral Lower Leg Right, Medial Ankle N/A Wound Location: Gradually Appeared Gradually Appeared N/A Wounding Event: Sickle Cell Lesion Sickle Cell Lesion N/A Primary Etiology: N/A Venous Leg Ulcer N/A Secondary Etiology: Anemia, Sickle Cell Disease, Anemia, Sickle Cell Disease, N/A Comorbid History: Neuropathy Neuropathy 10/05/2012 06/26/2021 N/A Date Acquired: 39 40 N/A Weeks of Treatment: Open Open N/A Wound Status: No No N/A Wound Recurrence: Yes No  N/A Clustered Wound: 7x5x0.1 7.2x3.5x0.1 N/A Measurements L x W x D (cm) 27.489 19.792 N/A A (cm) : rea 2.749 1.979 N/A Volume (cm) : 85.40% 31.70% N/A % Reduction in A rea: 85.40% 31.70% N/A % Reduction in Volume: Full Thickness Without Exposed Full Thickness Without Exposed N/A Classification: Support Structures Support Structures Large Medium N/A Exudate A mount: Serosanguineous Serosanguineous N/A Exudate Type: red, [name] red, [name] N/A Exudate Color: Distinct, outline attached Distinct, outline attached N/A Wound Margin: Small (1-33%) Small (1-33%) N/A Granulation A mount: Pink, Pale Pink N/A Granulation Quality: Large (67-100%) Large (67-100%) N/A Necrotic A mount: Fat Layer (Subcutaneous Tissue): Yes Fat Layer (Subcutaneous Tissue): Yes N/A Exposed Structures: Fascia: No Fascia: No Tendon: No Tendon: No Muscle: No Muscle: No Joint: No Joint: No Bone: No Bone: No Small (1-33%) Small (1-33%) N/A Epithelialization: Debridement - Selective/Open Wound Debridement - Selective/Open Wound N/A Debridement: Pre-procedure Verification/Time Out 10:55 10:55 N/A Taken: Lidocaine 4% Topical Solution Lidocaine 4% Topical Solution  N/A Pain Control: [facility] N/A Tissue Debrided: Non-Viable Tissue Non-Viable Tissue N/A Level: 31.5 22.75 N/A Debridement A (sq cm): rea Curette Curette N/A Instrument: Minimum Minimum N/A Bleeding: Pressure Pressure N/A Hemostasis A chieved: 0 0 N/A Procedural Pain: 0 0 N/A Post Procedural Pain: Procedure was tolerated well Procedure was tolerated well N/A Debridement Treatment Response: 7x5x0.1 7.2x3.5x0.1 N/A Post Debridement Measurements L x W x D (cm) 2.749 1.979 N/A Post Debridement Volume: (cm) No Abnormalities Noted No Abnormalities Noted N/A Periwound Skin Texture: No Abnormalities Noted No Abnormalities Noted N/A Periwound Skin Moisture: No Abnormalities Noted No Abnormalities Noted N/A Periwound Skin Color: No Abnormality No Abnormality N/A Temperature: Debridement Debridement N/A Procedures Performed: Treatment Notes Electronic Signature(s) Signed: 04/04/2022 11:26:23 AM By: Fredirick Maudlin MD FACS Entered By: Fredirick Maudlin on 04/04/2022 11:26:23 -------------------------------------------------------------------------------- Multi-Disciplinary Care Plan Details Patient Name: Date of Service: [name], [name] 04/04/2022 10:15 A M Medical Record Number: [id number] Patient Account Number: [id number] Date of Birth/Sex: Treating RN: 1970-12-25 (51 y.o. [name] Primary Care Provider: [name] Other Clinician: Referring Provider: Treating Provider/Extender: Elyse Jarvis Weeks in Treatment: [place] reviewed with physician LENAY, LOVEJOY ([id number]) [id number] Page 4 of 9 Active Inactive Venous Leg Ulcer Nursing Diagnoses: Actual venous Insuffiency (use after diagnosis is confirmed) Knowledge deficit related to disease process and management Goals: Patient will maintain optimal edema control Date Initiated: 06/26/2021 Target Resolution Date:  05/19/2022 Goal Status: Active Interventions: Assess peripheral edema status every visit. Compression as ordered Treatment Activities: Therapeutic compression applied : 06/26/2021 Notes: Wound/Skin Impairment Nursing Diagnoses: Impaired tissue integrity Goals: Patient/caregiver will verbalize understanding of skin care regimen Date Initiated: 04/17/2021 Target Resolution Date: 05/19/2022 Goal Status: Active Ulcer/skin breakdown will have a volume reduction of 30% by week 4 Date Initiated: 04/17/2021 Date Inactivated: 05/29/2021 Target Resolution Date: 05/15/2021 Goal Status: Met Ulcer/skin breakdown will have a volume reduction of 50% by week 8 Date Initiated: 05/29/2021 Date Inactivated: 06/26/2021 Target Resolution Date: 06/26/2021 Goal Status: Unmet Unmet Reason: venous reflux Interventions: Assess patient/caregiver ability to obtain necessary supplies Assess patient/caregiver ability to perform ulcer/skin care regimen upon admission and as needed Assess ulceration(s) every visit Provide education on ulcer and skin care Treatment Activities: Topical wound management initiated : 04/17/2021 Notes: 06/08/21: Left leg wounds greater than 30% volume reduction, right leg acute infection. Electronic Signature(s) Signed: 04/05/2022 7:55:48 AM By: Dellie Catholic RN Entered By: Dellie Catholic on 04/05/2022 07:54:12 -------------------------------------------------------------------------------- Pain Assessment Details Patient Name: Date of Service: [name] URO  UMA, [name] 04/04/2022 10:15 A M Medical Record Number: [id number] Patient Account Number: [id number] Date of Birth/Sex: Treating RN: [birth year] (51 y.o. [name] Primary Care Provider: [name] Other Clinician: Referring Provider: Treating Provider/Extender: Elyse Jarvis Weeks in Treatment: 57 Active Problems Location of Pain Severity and Description of Pain Patient Has Helyn Numbers Site  Locations [place], [place] ([id number]) [id number] Page 5 of 9 Site Locations Pain Location: Generalized Pain With Dressing Change: No Duration of the Pain. Constant / Intermittento Constant Rate the pain. Current Pain Level: 5 Worst Pain Level: 10 Least Pain Level: 5 Tolerable Pain Level: 5 Character of Pain Describe the Pain: Difficult to Pinpoint Pain Management and Medication Current Pain Management: Medication: Yes Cold Application: No Rest: Yes Massage: No Activity: No T.E.N.S.: No Heat Application: No Leg drop or elevation: No Is the Current Pain Management Adequate: Adequate How does your wound impact your activities of daily livingo Sleep: No Bathing: No Appetite: No Relationship With Others: No Bladder Continence: No Emotions: No Bowel Continence: No Work: No Toileting: No Drive: No Dressing: No Hobbies: No Electronic Signature(s) Signed: 04/05/2022 7:55:48 AM By: Dellie Catholic RN Entered By: Dellie Catholic on 04/04/2022 11:05:26 -------------------------------------------------------------------------------- Patient/Caregiver Education Details Patient Name: Date of Service: [name], [name] 11/16/2023andnbsp10:15 A M Medical Record Number: [id number] Patient Account Number: [id number] Date of Birth/Gender: Treating RN: 1970/11/22 (51 y.o. [name] Primary Care Physician: [name] Other Clinician: Referring Physician: Treating Physician/Extender: Elyse Jarvis Weeks in Treatment: 7 Education Assessment Education Provided To: Patient Education Topics Provided Wound/Skin Impairment: Methods: Explain/Verbal Responses: Return demonstration correctly Electronic Signature(s) Signed: 04/05/2022 7:55:48 AM By: Dellie Catholic RN Ambrose Pancoast, Tynlee ([id number]) AM By: Dellie Catholic RN [id number] Page 6 of 9 Signed: 04/05/2022 7:55:48 Entered By: Dellie Catholic  on 04/05/2022 07:54:28 -------------------------------------------------------------------------------- Wound Assessment Details Patient Name: Date of Service: [name] [name] 04/04/2022 10:15 A M Medical Record Number: [id number] Patient Account Number: [id number] Date of Birth/Sex: Treating RN: Jun 03, 1970 (51 y.o. [name] Primary Care Provider: [name] Other Clinician: Referring Provider: Treating Provider/Extender: Elyse Jarvis Weeks in Treatment: 50 Wound Status Wound Number: 17 Primary Etiology: Sickle Cell Lesion Wound Location: Right, Lateral Lower Leg Wound Status: Open Wounding Event: Gradually Appeared Comorbid History: Anemia, Sickle Cell Disease, Neuropathy Date Acquired: 10/05/2012 Weeks Of Treatment: 50 Clustered Wound: Yes Photos Wound Measurements Length: (cm) 7 Width: (cm) 5 Depth: (cm) 0.1 Area: (cm) 27.489 Volume: (cm) 2.749 % Reduction in Area: 85.4% % Reduction in Volume: 85.4% Epithelialization: Small (1-33%) Tunneling: No Undermining: No Wound Description Classification: Full Thickness Without Exposed Support Structures Wound Margin: Distinct, outline attached Exudate Amount: Large Exudate Type: Serosanguineous Exudate Color: red, [name] Foul Odor After Cleansing: No Slough/Fibrino Yes Wound Bed Granulation Amount: Small (1-33%) Exposed Structure Granulation Quality: Pink, Pale Fascia Exposed: No Necrotic Amount: Large (67-100%) Fat Layer (Subcutaneous Tissue) Exposed: Yes Necrotic Quality: Adherent Slough Tendon Exposed: No Muscle Exposed: No Joint Exposed: No Bone Exposed: No Periwound Skin Texture Texture Color No Abnormalities Noted: Yes No Abnormalities Noted: Yes Moisture Temperature / Pain No Abnormalities Noted: Yes Temperature: No Abnormality Treatment Notes Wound #17 (Lower Leg) Wound Laterality: Right, Lateral [name] ([id number]) [id number] Page 7  of 9 Cleanser Soap and Water Discharge Instruction: May shower and wash wound with dial antibacterial soap and water prior to dressing change. Wound Cleanser Discharge Instruction: Cleanse the wound with wound cleanser prior to applying a clean dressing using gauze sponges, not tissue  or cotton balls. Peri-Wound Care Triamcinolone 15 (g) Discharge Instruction: Use triamcinolone 15 (g) as directed Sween Lotion (Moisturizing lotion) Discharge Instruction: Apply moisturizing lotion as directed Topical Primary Dressing MediHoney Gel, tube 1.5 (oz) Discharge Instruction: Apply to wound bed as instructed Secondary Dressing ABD Pad, 5x9 Discharge Instruction: Apply over primary dressing as directed. Zetuvit Plus 4x8 in Discharge Instruction: Apply over primary dressing as directed. Secured With Elastic Bandage 4 inch (ACE bandage) Discharge Instruction: Secure with ACE bandage as directed. Compression Wrap Kerlix Roll 4.5x3.1 (in/yd) Discharge Instruction: Apply Kerlix and Coban compression as directed. Compression Stockings Add-Ons Electronic Signature(s) Signed: 04/05/2022 7:55:48 AM By: Dellie Catholic RN Entered By: Dellie Catholic on 04/04/2022 10:54:36 -------------------------------------------------------------------------------- Wound Assessment Details Patient Name: Date of Service: [name], [name] [name] 04/04/2022 10:15 A M Medical Record Number: [id number] Patient Account Number: [id number] Date of Birth/Sex: Treating RN: September 06, 1970 (51 y.o. [name] Primary Care Chilton Sallade: [name] Other Clinician: Referring Belkys Henault: Treating Krystyna Cleckley/Extender: Elyse Jarvis Weeks in Treatment: 50 Wound Status Wound Number: 21 Primary Etiology: Sickle Cell Lesion Wound Location: Right, Medial Ankle Secondary Etiology: Venous Leg Ulcer Wounding Event: Gradually Appeared Wound Status: Open Date Acquired: 06/26/2021 Comorbid History: Anemia, Sickle  Cell Disease, Neuropathy Weeks Of Treatment: 40 Clustered Wound: No Photos [name], [name] ([id number]) [id number] Page 8 of 9 Wound Measurements Length: (cm) 7.2 Width: (cm) 3.5 Depth: (cm) 0.1 Area: (cm) 19.792 Volume: (cm) 1.979 % Reduction in Area: 31.7% % Reduction in Volume: 31.7% Epithelialization: Small (1-33%) Tunneling: No Undermining: No Wound Description Classification: Full Thickness Without Exposed Support Structures Wound Margin: Distinct, outline attached Exudate Amount: Medium Exudate Type: Serosanguineous Exudate Color: red, [name] Foul Odor After Cleansing: No Slough/Fibrino Yes Wound Bed Granulation Amount: Small (1-33%) Exposed Structure Granulation Quality: Pink Fascia Exposed: No Necrotic Amount: Large (67-100%) Fat Layer (Subcutaneous Tissue) Exposed: Yes Necrotic Quality: Adherent Slough Tendon Exposed: No Muscle Exposed: No Joint Exposed: No Bone Exposed: No Periwound Skin Texture Texture Color No Abnormalities Noted: Yes No Abnormalities Noted: Yes Moisture Temperature / Pain No Abnormalities Noted: Yes Temperature: No Abnormality Treatment Notes Wound #21 (Ankle) Wound Laterality: Right, Medial Cleanser Soap and Water Discharge Instruction: May shower and wash wound with dial antibacterial soap and water prior to dressing change. Wound Cleanser Discharge Instruction: Cleanse the wound with wound cleanser prior to applying a clean dressing using gauze sponges, not tissue or cotton balls. Peri-Wound Care Triamcinolone 15 (g) Discharge Instruction: Use triamcinolone 15 (g) as directed Sween Lotion (Moisturizing lotion) Discharge Instruction: Apply moisturizing lotion as directed Topical Primary Dressing MediHoney Gel, tube 1.5 (oz) Discharge Instruction: Apply to wound bed as instructed Secondary Dressing ABD Pad, 5x9 Discharge Instruction: Apply over primary dressing as directed. Zetuvit Plus 4x8  in Discharge Instruction: Apply over primary dressing as directed. Secured With Elastic Bandage 4 inch (ACE bandage) [name] ([id number]) [id number] Page 9 of 9 Discharge Instruction: Secure with ACE bandage as directed. Compression Wrap Kerlix Roll 4.5x3.1 (in/yd) Discharge Instruction: Apply Kerlix and Coban compression as directed. Compression Stockings Add-Ons Electronic Signature(s) Signed: 04/05/2022 7:55:48 AM By: Dellie Catholic RN Entered By: Dellie Catholic on 04/04/2022 10:55:05 -------------------------------------------------------------------------------- Vitals Details Patient Name: Date of Service: [name], [name] 04/04/2022 10:15 A M Medical Record Number: [id number] Patient Account Number: [id number] Date of Birth/Sex: Treating RN: [birth year] (51 y.o. [name] Primary Care Brantley Naser: [name] Other Clinician: Referring Shaquille Janes: Treating Perrin Eddleman/Extender: Elyse Jarvis Weeks in Treatment: 50 Vital Signs Time Taken: 10:46 Temperature (F): 97.9  Height (in): 67 Pulse (bpm): 72 Respiratory Rate (breaths/min): 16 Blood Pressure (mmHg): 130/75 Reference Range: 80 - 120 mg / dl Electronic Signature(s) Signed: 04/05/2022 7:55:48 AM By: Dellie Catholic RN Entered By: Dellie Catholic on 04/04/2022 11:04:37

## 2022-04-08 ENCOUNTER — Telehealth: Payer: Self-pay | Admitting: Family Medicine

## 2022-04-08 ENCOUNTER — Other Ambulatory Visit: Payer: Self-pay | Admitting: Family Medicine

## 2022-04-08 ENCOUNTER — Institutional Professional Consult (permissible substitution): Payer: Medicaid Other | Admitting: Plastic Surgery

## 2022-04-08 DIAGNOSIS — G894 Chronic pain syndrome: Secondary | ICD-10-CM

## 2022-04-08 MED ORDER — OXYCODONE HCL 10 MG PO TABS: 10.0000 mg | ORAL_TABLET | ORAL | 0 refills | Status: DC

## 2022-04-08 NOTE — Telephone Encounter (Signed)
Oxycodone 10 mg and ibuprofen 600 mg refill request

## 2022-04-08 NOTE — Progress Notes (Signed)
Reviewed PDMP substance reporting system prior to prescribing opiate medications. No inconsistencies noted.  Meds ordered this encounter  Medications   Oxycodone HCl 10 MG TABS    Sig: Take 1 tablet (10 mg total) by mouth every 4 (four) hours while awake.    Dispense:  90 tablet    Refill:  0    Order Specific Question:   Supervising Provider    Answer:   JEGEDE, OLUGBEMIGA E [[id number]]   [name] Perine Moore Taeya Theall  APRN, MSN, FNP-C [facility] [address]  [place], [place] [address] [phone]  

## 2022-04-10 NOTE — Progress Notes (Signed)
Established Patient Office Visit  Subjective   Patient ID: [name], female    DOB: [birth year]  Age: 51 y.o. MRN: [id number]  Chief Complaint  Patient presents with   sickle cell follow up    [name] is a 51 year old female with a medical history significant for sickle cell disease, chronic pain syndrome, opiate dependence and tolerance, bilateral leg ulcers, and anemia of chronic disease presents for 68-monthfollow-up of chronic conditions.  Patient states that she has been doing well.  She continues to be followed by wound care for bilateral leg ulcers.  Patient states that she is scheduled for surgery in the next couple weeks. Patient continues to have chronic low back and lower extremity pain.  Pain has been well-controlled on current medication regimen.  She last had oxycodone this a.m. with moderate relief.  Current pain intensity is 5/10.  Patient has generally well-controlled sickle cell disease with infrequent pain crisis.  Patient denies any fever, headache, shortness of breath, urinary symptoms, nausea, vomiting, or diarrhea.    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   Venous stasis 01/19/2020   Partial loss of skin graft 01/12/2020   Acute respiratory failure with hypoxia (HCC) 12/15/2019   Bone infarction of distal tibia, right ([facility] 11/17/2019   Sickle-cell disease with pain ([facility] 09/03/2018   Other chronic pain    Sickle cell crisis ([facility] [facility] 06/26/2018   Sickle cell pain crisis ([facility] 04/07/2018   Avascular necrosis of bone of hip, left ([facility] 12/08/2017   Avascular necrosis of bone of hip, right ([facility] 12/08/2017   Hb-SS disease without crisis ([facility] 11/26/2017   Anemia of chronic disease 11/26/2017    Abnormal quad screen    Ringworm of body 04/05/2017   Ulcer of right lower extremity ([facility]    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[facility] 03/02/2017   History of pre-eclampsia 12/16/2016   Previous cesarean delivery x 2 07/22/2013   Hemochromatosis 11/10/2012   Chronic pain syndrome 066/10/3014  Complicated wound infection 04/01/2012   Leukocytosis 03/30/2012   Past Medical History:  Diagnosis Date   Anemia 03/30/2012   Hx of sickle cell disease   CAP (community acquired pneumonia)    2014   Cholelithiasis 03/30/2012   Chronic wound of extremity 04/01/2012   Elevated LFTs    Leg ulcer ([facility] 10/27/2012   Chronic under care of wound clinic   Multiple open wounds of lower extremity    chronic wounds B/LLE   Sickle cell disease ([facility]    Sinus bradycardia by electrocardiogram 04/03/2012   Past Surgical History:  Procedure Laterality Date   ALLOGRAFT APPLICATION Right 90/02/9322  Procedure: SURGICAL PREP FOR GRAFTING RIGHT LOWER EXTREMITY AND APPLICATION OF TJannifer Hick  Surgeon: BIrene Limbo MD;  [facility]  Service: Plastics;  Laterality: Right;   APPLICATION OF A-CELL OF EXTREMITY Right 10/09/2015   Procedure: APPLICATION OF TJannifer Hick  Surgeon: BIrene Limbo MD;  [place] [facility]  Service: Plastics;  Laterality: Right;   BREAST SURGERY     left breast cyst aspiration   CESAREAN SECTION     CESAREAN SECTION N/A 01/06/2014   Procedure: CESAREAN SECTION;  Surgeon: MEmily Filbert MD;  [facility];  Service:  Obstetrics;  Laterality: N/A;   CESAREAN SECTION N/A 05/04/2017   Procedure: CESAREAN SECTION;  Surgeon: Woodroe Mode, MD;  Location: [facility];  Service: Obstetrics;  Laterality: N/A;   I & D EXTREMITY Right 10/09/2015   Procedure: SURGICAL PREPARATION FOR GRAFTING RIGHT ANKLE AND APPLICATION THERASKIN;  Surgeon: Irene Limbo, MD;  Location: [facility];  Service: Plastics;  Laterality: Right;    SKIN FULL THICKNESS GRAFT Bilateral 06/17/2016   Procedure: SURGICAL PREP FOR GRAFTING, BILATERAL LOWER EXTREMITIES AND APPLICATION OF Jannifer Hick;  Surgeon: Irene Limbo, MD;  Location: [facility];  Service: Plastics;  Laterality: Bilateral;   TONSILLECTOMY     Social History   Tobacco Use   Smoking status: Never    Passive exposure: Never   Smokeless tobacco: Never  Vaping Use   Vaping Use: Never used  Substance Use Topics   Alcohol use: No   Drug use: No   Social History   Socioeconomic History   Marital status: Married    Spouse name: [name]   Number of children: 1   Years of education: Not on file   Highest education level: Not on file  Occupational History   Occupation: Employed in home care.  Tobacco Use   Smoking status: Never    Passive exposure: Never   Smokeless tobacco: Never  Vaping Use   Vaping Use: Never used  Substance and Sexual Activity   Alcohol use: No   Drug use: No   Sexual activity: Yes    Birth control/protection: None  Other Topics Concern   Not on file  Social History Narrative   Lives with husband.   Social Determinants of Health   Financial Resource Strain: Not on file  Food Insecurity: Not on file  Transportation Needs: Not on file  Physical Activity: Not on file  Stress: Not on file  Social Connections: Not on file  Intimate Partner Violence: Not on file   Family Status  Relation Name Status   Sister  (Not Specified)   Mother  Alive   Father  Deceased   Family History  Problem Relation Age of Onset   Sickle cell anemia Sister    Diabetes Mother    Asthma Mother    Sickle cell trait Mother    Sickle cell trait Father    Hypertension Father    No Known Allergies    Review of Systems  Constitutional: Negative.  Negative for malaise/fatigue.  HENT: Negative.    Eyes: Negative.   Cardiovascular: Negative.   Gastrointestinal: Negative.   Genitourinary: Negative.   Musculoskeletal:  Positive for back pain  and joint pain.  Skin: Negative.   Neurological: Negative.   Psychiatric/Behavioral: Negative.        Objective:     BP (!) 118/58   Pulse 68   Temp 97.7 F (36.5 C)   Ht _0  (1.702 m)   Wt 129 lb (58.5 kg)   BMI 20.20 kg/m  BP Readings from Last 3 Encounters:  03/21/22 121/62  03/20/22 116/68  03/19/22 (!) 118/58   Wt Readings from Last 3 Encounters:  03/19/22 129 lb (58.5 kg)  12/18/21 132 lb (59.9 kg)  09/11/21 134 lb 3.2 oz (60.9 kg)      Physical Exam Constitutional:      Appearance: Normal appearance.  Eyes:     Conjunctiva/sclera: Conjunctivae normal.     Pupils: Pupils are equal, round, and reactive to light.  Cardiovascular:     Rate and  Rhythm: Normal rate and regular rhythm.  Pulmonary:     Effort: Pulmonary effort is normal.     Breath sounds: Normal breath sounds.  Abdominal:     General: Abdomen is flat. Bowel sounds are normal.  Musculoskeletal:        General: Normal range of motion.  Skin:    General: Skin is warm.  Neurological:     General: No focal deficit present.     Mental Status: She is alert and oriented to person, place, and time. Mental status is at baseline.  Psychiatric:        Mood and Affect: Mood normal.        Behavior: Behavior normal.        Thought Content: Thought content normal.        Judgment: Judgment normal.      Results for orders placed or performed in visit on 03/19/22  [id number] 11+Oxyco+Alc+Crt-Bund  Result Value Ref Range   Ethanol Negative Cutoff=0.020 %   Amphetamines, Urine Negative Cutoff=1000 ng/mL   Barbiturate Negative Cutoff=200 ng/mL   BENZODIAZ UR QL Negative Cutoff=200 ng/mL   Cannabinoid Quant, Ur Negative Cutoff=50 ng/mL   Cocaine (Metabolite) Negative Cutoff=300 ng/mL   OPIATE SCREEN URINE See Final Results Cutoff=300 ng/mL   Oxycodone/Oxymorphone, Urine See Final Results Cutoff=300 ng/mL   Phencyclidine Negative Cutoff=25 ng/mL   Methadone Screen, Urine Negative Cutoff=300 ng/mL    Propoxyphene Negative Cutoff=300 ng/mL   Meperidine Negative Cutoff=200 ng/mL   Tramadol Negative Cutoff=200 ng/mL   Creatinine 78.7 20.0 - 300.0 mg/dL   pH, Urine 5.9 4.5 - 8.9  Sickle Cell Panel  Result Value Ref Range   Glucose 84 70 - 99 mg/dL   BUN 6 6 - 24 mg/dL   Creatinine, Ser 0.53 (L) 0.57 - 1.00 mg/dL   eGFR 113 >59 mL/min/1.73   BUN/Creatinine Ratio 11 9 - 23   Sodium 142 134 - 144 mmol/L   Potassium 4.4 3.5 - 5.2 mmol/L   Chloride 108 (H) 96 - 106 mmol/L   CO2 16 (L) 20 - 29 mmol/L   Calcium 9.5 8.7 - 10.2 mg/dL   Total Protein 7.2 6.0 - 8.5 g/dL   Albumin 4.4 3.9 - 4.9 g/dL   Globulin, Total 2.8 1.5 - 4.5 g/dL   Albumin/Globulin Ratio 1.6 1.2 - 2.2   Bilirubin Total 4.4 (H) 0.0 - 1.2 mg/dL   Alkaline Phosphatase 65 44 - 121 IU/L   AST 59 (H) 0 - 40 IU/L   ALT 20 0 - 32 IU/L   Ferritin 1,176 (H) 15 - 150 ng/mL   Vit D, 25-Hydroxy 26.1 (L) 30.0 - 100.0 ng/mL   WBC 11.7 (H) 3.4 - 10.8 x10E3/uL   RBC 1.80 (LL) 3.77 - 5.28 x10E6/uL   Hemoglobin 5.2 (LL) 11.1 - 15.9 g/dL   Hematocrit 15.4 (LL) 34.0 - 46.6 %   MCV 86 79 - 97 fL   MCH 28.9 26.6 - 33.0 pg   MCHC 33.8 31.5 - 35.7 g/dL   RDW 23.8 (H) 11.7 - 15.4 %   Platelets 396 150 - 450 x10E3/uL   Neutrophils 61 Not Estab. %   Lymphs 20 Not Estab. %   Monocytes 12 Not Estab. %   Eos 5 Not Estab. %   Basos 1 Not Estab. %   Neutrophils Absolute 7.3 (H) 1.4 - 7.0 x10E3/uL   Lymphocytes Absolute 2.3 0.7 - 3.1 x10E3/uL   Monocytes Absolute 1.5 (H) 0.1 - 0.9 x10E3/uL   EOS (ABSOLUTE)  0.5 (H) 0.0 - 0.4 x10E3/uL   Basophils Absolute 0.1 0.0 - 0.2 x10E3/uL   Immature Granulocytes 1 Not Estab. %   Immature Grans (Abs) 0.1 0.0 - 0.1 x10E3/uL   NRBC 20 (H) 0 - 0 %   Hematology Comments: Note:    Retic Ct Pct 20.8 (H) 0.6 - 2.6 %  Opiates Confirmation, Urine  Result Value Ref Range   Opiates Negative Cutoff=300 ng/mL  Oxycodone/Oxymorphone, Confirm  Result Value Ref Range   OXYCODONE/OXYMORPH Positive (A) Cutoff=300    OXYCODONE Positive (A)    OXYCODONE 908 Cutoff=300 ng/mL   OXYMORPHONE Positive (A)    OXYMORPHONE (GC/MS) 1,773 Cutoff=300 ng/mL  Urinalysis Dipstick  Result Value Ref Range   Color, UA dark yellow    Clarity, UA clear    Glucose, UA Negative Negative   Bilirubin, UA small    Ketones, UA neg    Spec Grav, UA 1.015 1.010 - 1.025   Blood, UA small    pH, UA 5.5 5.0 - 8.0   Protein, UA Positive (A) Negative   Urobilinogen, UA 0.2 0.2 or 1.0 E.U./dL   Nitrite, UA neg    Leukocytes, UA Negative Negative   Appearance     Odor      Last CBC Lab Results  Component Value Date   WBC 9.0 03/21/2022   HGB 7.4 (L) 03/21/2022   HCT 21.3 (L) 03/21/2022   MCV 80.7 03/21/2022   MCH 28.0 03/21/2022   RDW 32.8 (H) 03/21/2022   PLT 361 29/93/7169   Last metabolic panel Lab Results  Component Value Date   GLUCOSE 85 03/21/2022   NA 141 03/21/2022   K 3.8 03/21/2022   CL 114 (H) 03/21/2022   CO2 21 (L) 03/21/2022   BUN 6 03/21/2022   CREATININE 0.38 (L) 03/21/2022   GFRNONAA >60 03/21/2022   CALCIUM 9.0 03/21/2022   PROT 7.7 03/21/2022   ALBUMIN 4.0 03/21/2022   LABGLOB 2.8 03/19/2022   AGRATIO 1.6 03/19/2022   BILITOT 4.4 (H) 03/21/2022   ALKPHOS 51 03/21/2022   AST 55 (H) 03/21/2022   ALT 26 03/21/2022   ANIONGAP 6 03/21/2022   Last lipids Lab Results  Component Value Date   CHOL 143 11/25/2014   HDL 27 (L) 11/25/2014   LDLCALC 89 11/25/2014   TRIG 137 11/25/2014   CHOLHDL 5.3 11/25/2014   Last hemoglobin A1c Lab Results  Component Value Date   HGBA1C 4.4 (L) 03/20/2017   Last thyroid functions Lab Results  Component Value Date   TSH 0.099 (L) 03/20/2017   Last vitamin D Lab Results  Component Value Date   VD25OH 26.1 (L) 03/19/2022   Last vitamin B12 and Folate Lab Results  Component Value Date   FOLATE 9.5 03/02/2018      The ASCVD Risk score (Arnett DK, et al., 2019) failed to calculate for the following reasons:   Cannot find a previous HDL  lab   Cannot find a previous total cholesterol lab    Assessment & Plan:   Problem List Items Addressed This Visit       Other   Chronic pain syndrome (Chronic)   Relevant Orders   [id number] 11+Oxyco+Alc+Crt-Bund (Completed)   Hb-SS disease without crisis ([place]) - Primary   Relevant Orders   Urinalysis Dipstick (Completed)   [id number] 11+Oxyco+Alc+Crt-Bund (Completed)   Sickle Cell Panel (Completed)   Other Visit Diagnoses     Vitamin D deficiency       Relevant Orders  Sickle Cell Panel (Completed)     1. Hb-SS disease without crisis ([place]) We discussed the need for good hydration, monitoring of hydration status, avoidance of heat, cold, stress, and infection triggers. We discussed the risks and benefits of Hydrea, including bone marrow suppression, the possibility of GI upset, skin ulcers, hair thinning, and teratogenicity. The patient was reminded of the need to seek medical attention of any symptoms of bleeding, anemia, or infection. Continue folic acid 1 mg daily to prevent aplastic bone marrow crises.   Pulmonary evaluation - Patient denies severe recurrent wheezes, shortness of breath with exercise, or persistent cough. If these symptoms develop, pulmonary function tests with spirometry will be ordered, and if abnormal, plan on referral to Pulmonology for further evaluation. Cardiac - Routine screening for pulmonary hypertension is not recommended.  Eye - High risk of proliferative retinopathy. Annual eye exam with retinal exam recommended to patient.  - Urinalysis Dipstick - [id number] 11+Oxyco+Alc+Crt-Bund - Sickle Cell Panel  2. Chronic pain syndrome - [id number] 11+Oxyco+Alc+Crt-Bund  3. Vitamin D deficiency - Sickle Cell Panel   Return in about 3 months (around 06/19/2022) for sickle cell anemia.   [name]  APRN, MSN, FNP-C [facility] [address] [place], [place] [address] [phone]

## 2022-04-12 ENCOUNTER — Telehealth: Payer: Self-pay

## 2022-04-12 ENCOUNTER — Other Ambulatory Visit: Payer: Self-pay

## 2022-04-12 NOTE — Telephone Encounter (Signed)
Prior authorization for Oxycodone '10mg'$  was submitted today via CoverMyMeds Key: [id number]

## 2022-04-12 NOTE — Telephone Encounter (Signed)
Approved until 10/09/2022

## 2022-04-18 ENCOUNTER — Encounter (HOSPITAL_BASED_OUTPATIENT_CLINIC_OR_DEPARTMENT_OTHER): Payer: Medicaid Other | Admitting: General Surgery

## 2022-04-18 DIAGNOSIS — L97818 Non-pressure chronic ulcer of other part of right lower leg with other specified severity: Secondary | ICD-10-CM | POA: Diagnosis not present

## 2022-04-19 NOTE — Progress Notes (Signed)
[name], [name] ([id number]) [id number] Page 1 of 16 Visit Report for 04/18/2022 Chief Complaint Document Details Patient Name: Date of Service: [name] [name] 04/18/2022 10:15 A M Medical Record Number: [id number] Patient Account Number: [id number] Date of Birth/Sex: Treating RN: Jul 04, 1970 (51 y.o. F) Primary Care Provider: [name] Other Clinician: Referring Provider: Treating Provider/Extender: Elyse Jarvis Weeks in Treatment: 51 Information Obtained from: Patient Chief Complaint the patient is here for evaluation of her bilateral lower extremity sickle cell ulcers 04/17/2021; patient comes in for substantial wounds on the right and left lower leg Electronic Signature(s) Signed: 04/18/2022 11:54:19 AM By: Fredirick Maudlin MD FACS Entered By: Fredirick Maudlin on 04/18/2022 11:54:19 -------------------------------------------------------------------------------- Debridement Details Patient Name: Date of Service: [name], [name] 04/18/2022 10:15 A M Medical Record Number: [id number] Patient Account Number: [id number] Date of Birth/Sex: Treating RN: 06-23-1970 (51 y.o. [name] Primary Care Provider: [name] Other Clinician: Referring Provider: Treating Provider/Extender: Elyse Jarvis Weeks in Treatment: 52 Debridement Performed for Assessment: Wound #21 Right,Medial Ankle Performed By: Physician Fredirick Maudlin, MD Debridement Type: Debridement Severity of Tissue Pre Debridement: Fat layer exposed Level of Consciousness (Pre-procedure): Awake and Alert Pre-procedure Verification/Time Out Yes - 10:22 Taken: Start Time: 10:22 Pain Control: Lidocaine 5% topical ointment T Area Debrided (L x W): otal 4 (cm) x 3.5 (cm) = 14 (cm) Tissue and other material debrided: Non-Viable, Eschar, Slough, Slough Level: Non-Viable Tissue Debridement Description: Selective/Open  Wound Instrument: Curette Bleeding: Minimum Hemostasis Achieved: Pressure Response to Treatment: Procedure was tolerated well Level of Consciousness (Post- Awake and Alert procedure): Post Debridement Measurements of Total Wound Length: (cm) 8 Width: (cm) 3.5 Depth: (cm) 0.1 Volume: (cm) 2.199 Character of Wound/Ulcer Post Debridement: Improved Severity of Tissue Post Debridement: Fat layer exposed [name] ([id number]) [id number] Page 2 of 16 Post Procedure Diagnosis Same as Pre-procedure Notes scribed for Dr. Celine Ahr by Adline Peals, RN Electronic Signature(s) Signed: 04/18/2022 12:45:53 PM By: Fredirick Maudlin MD FACS Signed: 04/18/2022 5:21:53 PM By: Adline Peals Entered By: Adline Peals on 04/18/2022 10:23:08 -------------------------------------------------------------------------------- Debridement Details Patient Name: Date of Service: [name], [name] 04/18/2022 10:15 A M Medical Record Number: [id number] Patient Account Number: [id number] Date of Birth/Sex: Treating RN: [birth year] (51 y.o. [name] Primary Care Provider: [name] Other Clinician: Referring Provider: Treating Provider/Extender: Elyse Jarvis Weeks in Treatment: 52 Debridement Performed for Assessment: Wound #17 Right,Lateral Lower Leg Performed By: Physician Fredirick Maudlin, MD Debridement Type: Debridement Level of Consciousness (Pre-procedure): Awake and Alert Pre-procedure Verification/Time Out Yes - 10:22 Taken: Start Time: 10:22 Pain Control: Lidocaine 5% topical ointment T Area Debrided (L x W): otal 3 (cm) x 3 (cm) = 9 (cm) Tissue and other material debrided: Non-Viable, Eschar, Slough, Slough Level: Non-Viable Tissue Debridement Description: Selective/Open Wound Instrument: Curette Bleeding: Minimum Hemostasis Achieved: Pressure Response to Treatment: Procedure was tolerated well Level of  Consciousness (Post- Awake and Alert procedure): Post Debridement Measurements of Total Wound Length: (cm) 6.5 Width: (cm) 5.3 Depth: (cm) 0.1 Volume: (cm) 2.706 Character of Wound/Ulcer Post Debridement: Improved Post Procedure Diagnosis Same as Pre-procedure Notes scribed for Dr. Celine Ahr by Adline Peals, RN Electronic Signature(s) Signed: 04/18/2022 12:45:53 PM By: Fredirick Maudlin MD FACS Signed: 04/18/2022 5:21:53 PM By: Adline Peals Entered By: Adline Peals on 04/18/2022 10:24:13 HPI Details -------------------------------------------------------------------------------- [name] ([id number]) [id number] Page 3 of 16 Patient Name: Date of Service: [name], [place] [name] 04/18/2022 10:15 A M Medical Record Number: [id number] Patient Account  Number: [id number] Date of Birth/Sex: Treating RN: 07-Jan-1971 (51 y.o. F) Primary Care Provider: [name] Other Clinician: Referring Provider: Treating Provider/Extender: Elyse Jarvis Weeks in Treatment: 66 History of Present Illness Location: medial and lateral ankle region on the right and left medial malleolus Quality: Patient reports experiencing a shooting pain to affected area(s). Severity: Patient states wound(s) are getting worse. Duration: right lower extremity bimalleolar ulcers have been present for approximately 2 years; the rright meedial malleolus ulcer has been there proximally 6 months Timing: Pain in wound is constant (hurts all the time) Context: The wound would happen gradually ssociated Signs and Symptoms: Patient reports having increase discharge. A HPI Description: 51 year old patient with a history of sickle cell anemia who was last seen by me with ulceration of the right lower extremity above the ankle and was referred to Dr. Leland Johns for a surgical debridement as I was unable to do anything in the office due to excruciating pain. At that  stage she was referred from the plastic surgery service to dermatology who treated her for a skin infection with doxycycline and then Levaquin and a local antibiotic ointment. I understand the patient has since developed ulceration on the left ankle both medial and lateral and was now referred back to the wound center as dermatology has finished the management. I do not have any notes from the dermatology department Old notes: 51 year old patient with a history of sickle cell anemia, pain bilateral lower extremities, right lower extremity ulcer and has a history of receiving a skin graft( Theraskin) several months ago. She has been visiting the wound center [facility] and was seen by Dr. Dellia Nims and Dr. Leland Johns. after prolonged conservator therapy between July 2016 and January 2017. She had been seen by the plastic surgeon and taken to the OR for debridement and application of Theraskin. She had 3 applications of Theraskin and was then treated with collagen. Prior to that she had a history of similar problems in 2014 and was treated conservatively. Had a reflux study done for the right lower extremity in August 2016 without reflux or DVT . Past medical history significant for sickle cell disease, anemia, leg ulcers, cholelithiasis,and has never been a smoker. Once the patient was discharged on the wound center she says within 2 or 3 weeks the problems recurred and she has been treating it conservatively. since I saw her 3 weeks ago at [facility] she has been unable to get her dressing material but has completed a course of doxycycline. 6/7/ 2017 -- lower extremity venous duplex reflux evaluation was done No evidence of SVT or DVT in the RLL. No venous incompetence in the RLL. No further vascular workup is indicated at this time. She was seen by Dr. [name], on 10/04/2015. She agreed with the plan of taking her to the OR for debridement and application of theraskin and would also take  biopsies to rule out pyoderma gangrenosum. Follow-up note dated May 31 received and she was status post application of Theraskin to multiple ulcers around the right ankle. Pathology did not show evidence of malignancy or pyoderma gangrenosum. She would continue to see as in the wound clinic for further care and see Dr. Leland Johns as needed. The patient brought the biopsy report and it was consistent with stasis ulcer no evidence of malignancy and the comment was that there was some adjacent neovascularization, fibrosis and patchy perivascular chronic inflammation. 11/15/2015 -- today we applied her first application of Theraskin 11/30/15; TheraSkin #2 12/13/2015 --  she is having a lot of pain locally and is here for possible application of a theraskin today. 01/16/2016 -- the patient has significant pain and has noticed despite in spite of all local care and oral pain medication. It is impossible to debride her in the office. 02/06/2016 -- I do not see any notes from Dr. Iran Planas( the patient has not made a call to the office know as she heard from them) and the only visit to recently was with her PCP Dr. Danella Penton -- I saw her on 01/16/2016 and prescribed 90 tablets of oxycodone 10 mg and did lab work and screening for HIV. the HIV was negative and hemoglobin was 6.3 with a WBC count of 14.9 and hematocrit of 17.8 with platelets of 561. 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 The bimalleolar ulcers to the right lower extremity have been present for approximately 2 years, the left medial malleolus ulcer has been present approximately 6 months. She has followed with Dr.Thimmappa in the past and has had a total of 3 applications of Theraskin (01/2015, 09/2015, 06/17/16). She has also followed with Dr. Con Memos here in the clinic and has received 2 applications of TheraSkin (11/10/15,  11/30/15). The patient does experience chronic, and is not amenable to debridement. 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 She is not currently on any antibiotic therapy and has not been treated with any recently. 07/17/2016 -- was seen by Dr. Iran Planas of plastic surgery who saw her 2 weeks postop application of Theraskin #3. She had removed her dressing and asked her to apply silver alginate on alternate days and follow-up back with the wound center. Future debridements and application of skin substitute would have to be done in the hospital due to her high risk for anesthesia. READMISSION 04/17/2021 Patient is now a 51 year old woman that we have had in this clinic for a prolonged period of time and 2016-2017 and then again for 2 visits in February 2018. At that point she had wounds on the right lower leg predominantly medial. She had also been seen by plastic surgery Dr. Leland Johns who I believe took her to the OR for operative debridement and application of TheraSkin in 2017. After she left our clinic she was followed for a very prolonged period of time in the wound care center in [facility] who then referred her ultimately to [facility] [address] where she was seen by Dr. Vernona Rieger. Again taken her to the OR for skin grafting which apparently did not take. She had multiple other attempts at dressings although I have not really looked over all of these notes in great detail. She has not been seen in a wound care center in about a year. She states over the last year in addition to her right lower leg she has developed wounds on the left lower leg quite extensive. She is using Xeroform to all of these wounds without really any improvement. She also has Medicaid which does not cover wound products. The patient has had vascular work-ups in the past including most recently on 03/28/2021 showing biphasic waveforms on the right triphasic at the PTA and biphasic at the dorsalis  pedis on the left. She was unable to tolerate any degree of compression to do ABIs. Unfortunately TBI's were also not done. She had venous reflux studies done in 2017. This did not show any  evidence of a DVT or SVT and no venous incompetence was noted in the right leg at the time this was the only side with the wound As noted I did not look all over her old records. She apparently had a course of HBO and [facility] although I am not sure what the indication would have been. In any case she developed seizures and terminated treatment earlier. She is generally much more disabled than when we last saw her in clinic. She can no longer walk pretty much wheelchair-bound because predominantly of pain in the left hip. 04/24/2021; the patient tolerated the wraps we put on. We used Santyl and Hydrofera Blue under compression. I brought her back for a nurse visit for a change in dressing. With Medicaid we will have a hard time getting anything paid for and hence the need for compression. She arrives in clinic with all the wounds looking somewhat better in terms of surface 12/20; circumferential wound on the right from the lateral to the medial. She has open areas on the left medial and left lateral x2 on all of this with the same surface. This does not look completely healthy although she does have some epithelialization. She is not complaining of a lot of pain which is unusual for her sickle ulcers. PRESLEE, REGAS ([id number]) [id number] Page 4 of 16 I have not looked over her extensive records from [facility]. She had recent arterial studies and has a history of venous reflux studies I will need to look these over although I do not believe she has significant arterial disease 2023 05/22/2021; patient's wound areas measure slightly smaller. Still a lot of drainage coming from the right we have been using Hydrofera Blue and Santyl with some improvement in the wound surfaces. She tells me  she will be getting transfused later in the week for her underlying sickle cell anemia I have looked over her recent arterial studies which were done in the fall. This was in November and showed biphasic and triphasic waveforms but she could not tolerate ABIs because of pressure and unfortunately TBI's were not done. She has not had recent venous reflux studies that I can see 1/10; not much change about the same surface area. This has a yellowish surface to it very gritty. We have been using Santyl and Hydrofera Blue for a prolonged period. Culture I did last week showed methicillin sensitive staph aureus "rare". Our intake nurse reports greenish drainage which may be the Hydrofera Blue itself 1/17; wounds are continue to measure smaller although I am not sure about the accuracy here. Especially the areas on the right are covered in what looks to be a nonviable surface although she does have some epithelialization. Similarly she has areas on the left medial and left lateral ankle area which appear to have a better surface and perhaps are slightly smaller. We have been using Santyl and Hydrofera Blue. She cannot tolerate mechanical debridements She went for her reflux studies which showed significant reflux at the greater saphenous vein at the saphenofemoral junction as well as the greater saphenous vein in the proximal calf on the left she had reflux in the thigh and the common femoral vein and supra vein Fishel vein reflux in the greater saphenous vein. I will have vein and vascular look at this. My thoughts have been that these are likely sickle wounds. I looked through her old records from [facility] wound care center and then when she graduated to [facility] wound care center where  she saw Dr. Zigmund Daniel and Dr. Vernona Rieger. Although I can see she had reflux studies done I do not see that she actually saw a vein and vascular. I went over the fact that she had operative debridements and actual skin  grafting that did not take. I do not think these wounds have ever really progressed towards healing 1/31;Substantial wounds on the right ankle area. Hyper granulated very gritty adherent debris on the surface. 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 On the left she has no DVT or SVT deep vein reflux in the common femoral vein. Superficial vein reflux in the greater saphenous vein on although the vein diameter is not really all that large. I do not think there is anything that can be done with these although I am going to send her for consultation to vein and vascular. 2/7; Wound exam; substantial wound area on the right posterior ankle area and areas on the left medial ankle and left lateral ankle. I was able to debride the left medial ankle last week fairly aggressively and it is back this week to a completely nonviable surface She will see vascular surgery this Friday and I would like them to review the venous studies and also any comments on her arterial status. If they do not see an issue here I am going to refer her to plastic surgery for an operative debridement perhaps intraoperative ACell or Integra. Eventually she will require a deep tissue culture again 2/14; substantial wound area on the right posterior ankle, medial ankle. We have been using silver alginate The patient was seen by vein and vascular she had both venous reflux studies and arterial studies. 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 She had no evidence of superficial venous thrombosis. She did  have superficial vein reflux at the saphenofemoral junction and the greater saphenous vein. On the left no evidence of a DVT no evidence of superficial venous throat thrombosis she did have deep vein reflux in the common femoral vein and superficial vein reflux in the greater saphenous vein but these were not felt to be amenable to ablation. In terms of arterial studies she had triphasic and wife biphasic wave waveforms bilaterally not felt to have a significant arterial issue. I do not get the feeling that they felt that any part of her nonhealing wounds were related to either arterial or venous issues. They did note that she had venous reflux at the right at the [facility] and [facility]. And also on the left there were reflux in the deep system at the common femoral vein and greater saphenous vein in the proximal thigh. Nothing amenable to ablation. 2/20; she is making some decent progress on the right where there is nice skin between the 2 open areas on the right ankle. The surfaces here do not look viable yet there is some surrounding epithelialization. She still has a small area on the left medial ankle area. Hyper-granulated Jody's away always 2/28 patient has an appointment with plastic surgery on 3/8. We will see her back  on 3/9. She may have to call us to get the area redressed. We've been using Santyl under silver alginate. We made a nice improvement on the left medial ankle. The larger wounds on the right also looks somewhat better in terms of epithelialization although I think they could benefit from an aggressive debridement if plastic surgery would be willing to do that. Perhaps placement of Integra or a cell 07/26/2021: She saw Dr. Claudia Desanctis yesterday. He raised the question as to whether or not this might be pyoderma and wanted to wait until that question was answered by dermatology before proceeding with any sort of operative debridement. We have continue to use Santyl under silver alginate with Kerlix  and Coban wraps. Overall, her wounds appear to be continuing to contract and epithelialize, with some granulation tissue present. There continues to be some slough on all wound surfaces. 08/09/2021: She has not been able to get an appointment with dermatology because apparently the offices in [place] do not accept Medicaid. She is looking into whether or not she can be seen at the main [facility] dermatology clinic. This is necessary because plastic surgery is concerned that her wounds might represent pyoderma and they did not want to do any procedure until that was clarified. We have been using Santyl under silver alginate with Kerlix and Coban wraps. Today, there was a greater amount of drainage on her dressings with a slight green discoloration and significant odor. Despite this, her wounds continue to contract and epithelialize. There is pale granulation tissue present and actually, on the left medial ankle, the granulation tissue is a bit hypertrophic. 08/16/2021: Last week, I took a culture and this grew back rare methicillin-resistant Staph aureus and rare corynebacterium. The MRSA was sensitive to gentamicin which we began applying topically on an empiric basis. This week, her wounds are a bit smaller and the drainage and odor are less. Her primary care provider is working on assisting the patient with a dermatology evaluation. She has been in silver alginate over the gentamicin that was started last week along with Kerlix and Coban wraps. 08/23/2021: Because she has Medicaid, we have been unable to get her into see any dermatologist in the [place] to rule out pyoderma gangrenosum, which was a requirement from plastic surgery prior to any sort of debridement and grafting. Despite this, however, all of her wounds continue to get smaller. The wound on her left medial ankle is nearly closed. There is no odor from the wounds, although she still accumulates a modest amount of  drainage on her dressings. 08/30/2021: The lateral right ankle wound and the medial left ankle wound are a bit smaller today. The medial right ankle wound is about the same size. They are less tender. We have still been unable to get her into dermatology. 09/06/2021: All of the wounds are about the same size today. She continues to endorse minimal pain. I communicated with Dr. Claudia Desanctis in plastic surgery regarding our issues getting a dermatology appointment; he was out of town but indicated that he would look into perhaps performing the biopsy in his office and will have his office contact her. 09/14/2021: The patient has an appointment in dermatology, but it is not until October. Her wounds are roughly the same; she continues to have very thick purulent-looking drainage on her dressings. MYLIAH, MEDEL ([id number]) [id number] Page 5 of 16 09/20/2021: The left medial wound is nearly closed and just has a bit of accumulated eschar on the surface.  The right medial and lateral ankle wounds are perhaps a little bit smaller. They continue to have a very pale surface with accumulation of thin slough. 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 I did not think [employer] was indicated based on this. She is getting topical mupirocin with Prisma silver collagen. 10/04/2021: The patient was not seen in clinic last week due to childcare coverage issues. In the interim, the left medial leg wound has closed. The right sided leg wounds are smaller. There is more granulation tissue coming through, particularly on the lateral wound. The surface remains somewhat gritty. We have been applying topical mupirocin and Prisma silver collagen. 10/11/2021: The left medial leg wound remains closed. She does complain of some anesthetic sensation to the area. Both of the right-sided leg wounds are smaller but still have accumulated slough. 10/18/2021: Both right-sided leg wounds are  minimally smaller this week. She still continues to accumulate slough and has thick drainage on her dressings. 10/23/2021: Both wounds continue to contract. There is still slough buildup. She has been approved for a keratin-based skin substitute trial product but it will not be available until next week. 10/30/2021: The wounds are about the same to perhaps slightly smaller. There is still continued slough buildup. Unfortunately, the rep for the keratin based product did not show up today and did not answer his phone when called. 11/08/2021: The wounds are little bit smaller today. She continues to have thick drainage but the surfaces are relatively clean with just a little bit of slough accumulation. She reported to me today that she is unable to completely flex her left ankle and on examination it seems this is potentially related to scar tissue from her wounds. We do have the ProgenaMatrix trial product available for her today. 11/15/2021: Both wounds are smaller today. There is some slough accumulation on the surfaces, but the medial wound, in particular looks like it is filling in and is less deep. She did hear from physical therapy and she is going to start working with them on July 11. She is here for her second application of the trial skin substitute, ProgenaMatrix. 11/22/2021: Both wounds continue to contract, the medial more dramatically than the lateral. Both wounds have a layer of slough on the surface, but underneath this, the gritty fibrous tissue has a little bit more of a pink cast to it rather than being as pale as it has been. 11/29/2021: The wounds are roughly the same size this week, perhaps a millimeter or 2 smaller. The medial wound has filled in and is nearly flush with the surrounding skin surface. She continues to have a lot of slough accumulation on both surfaces. 12/06/2021: No significant change to her wounds, but she has a new opening on her dorsal foot, just distal to the right  lateral ankle wound. The area on her left medial ankle that reopened looks a little bit larger today. She has quite a bit of pain associated with the new wound. 12/12/2021: Her wounds look about the same but the new opening on her right lateral dorsal foot is a little bit bigger. She continues to have a fair amount of pain with this wound. 12/26/2021: The left medial ankle wound is tiny and superficial. She has 2 areas of crusting on her left lateral ankle, however, that appear to be threatening to open again. Her right medial ankle wound is a little bit smaller today but still continues to accumulate thick rubbery slough. The  new dorsal foot wound is exquisitely painful but there is no odor or purulent drainage. No erythema or induration. The right lateral ankle wound looks about the same today, again with thick rubbery slough. 01/03/2022: The left medial ankle wound has closed again. Both right ankle wounds appear to be about the same size with thick rubbery slough. The dorsal foot wound on the right continues to be quite painful and she stated that she did not want any debridement of that site today. 01/10/2022: No real change to any of her wounds. She continues to accumulate thick slough. The dorsal foot wound has merged with the lateral malleolar wound. She is experiencing significant pain in the dorsal foot portion of the ulcer. 01/16/2022: Absolutely no change or progress in her wounds. 01/24/2022: Her wounds are unchanged. She continues to build up slough and the wounds on her dorsal right foot are still exquisitely tender. 01/31/2022: The wounds actually measure a little bit narrower today. They still have thick slough on the surface but the underlying tissue seems a little less fibrotic. We changed to Iodosorb last week. 02/07/2022: Wounds continue to slowly epithelialize around the parameters. She has less pain today. She still accumulates a fairly substantial layer of slough. 02/15/2022: No change  to the wounds overall. The measured a little bit larger today per the intake nurse. She continues to have substantial slough accumulation and her pain is a little bit worse. 02/21/2022. No change at all to her wounds. I do not see that plastic surgery has received her referral yet. 02/28/2022: The wounds remain unchanged. They are dry and fibrotic with accumulation of slough and eschar. She did receive an appointment to see plastic surgery on October 23, but the office called her back and indicated they needed to reschedule and that the next available appointment was not until December. The patient became angry and decided she did not want to see this plastics group. 10/19; the patient sees Dr. Lovett Calender again next week. She is using Medihoney as the primary dressing changing this herself. 03/21/2022: She saw Dr. Jaclynn Guarneri at the wound care center at [facility]. Dr. Jaclynn Guarneri was also unable to debride her, secondary to pain and is planning to take her to the operating room for operative debridement and potential skin substitute placement. Her wounds are unchanged with thick yellow slough and a fibrotic base. She says they are too painful to be debrided. In addition, yesterday her hemoglobin was 4 and she received 2 units packed red blood cell transfusion. 04/04/2022: Her operation at [facility] is scheduled for December 15. She continues to use Medihoney on her wounds. They are a little bit less painful today and she is willing to entertain the possibility of debridement. The wounds measured a little bit larger in all dimensions today but the layer of slough is not as thick as usual. 04/18/2022: Her wounds actually measured a little bit smaller today and the extension onto the dorsal foot from the lateral wound has healed. They are less tender and there is significantly less slough present as compared to prior visits. Electronic Signature(s) Signed: 04/18/2022 11:55:22 AM By:  Fredirick Maudlin MD FACS Entered By: Fredirick Maudlin on 04/18/2022 11:55:22 [name] ([id number]) [id number] Page 6 of 16 -------------------------------------------------------------------------------- Physical Exam Details Patient Name: Date of Service: [name] [name] 04/18/2022 10:15 A M Medical Record Number: [id number] Patient Account Number: [id number] Date of Birth/Sex: Treating RN: June 19, 1970 (51 y.o. F) Primary Care Provider: [name] Other Clinician:  Referring Provider: Treating Provider/Extender: Elyse Jarvis Weeks in Treatment: 52 Constitutional . . . . No acute distress. Respiratory Normal work of breathing on room air. Notes 04/18/2022: Her wounds actually measured a little bit smaller today and the extension onto the dorsal foot from the lateral wound has healed. They are less tender and there is significantly less slough present as compared to prior visits. Electronic Signature(s) Signed: 04/18/2022 11:55:48 AM By: Fredirick Maudlin MD FACS Entered By: Fredirick Maudlin on 04/18/2022 11:55:47 -------------------------------------------------------------------------------- Physician Orders Details Patient Name: Date of Service: [name], [name] 04/18/2022 10:15 A M Medical Record Number: [id number] Patient Account Number: [id number] Date of Birth/Sex: Treating RN: August 24, 1970 (51 y.o. [name] Primary Care Provider: [name] Other Clinician: Referring Provider: Treating Provider/Extender: Elyse Jarvis Weeks in Treatment: 71 Verbal / Phone Orders: No Diagnosis Coding ICD-10 Coding Code Description L97.818 Non-pressure chronic ulcer of other part of right lower leg with other specified severity 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 Follow-up Appointments Return appointment in 3 weeks. - Dr. Celine Ahr - room 3 - 05/09/22 Anesthetic (In clinic) Topical  Lidocaine 5% applied to wound bed - Used in clinic Bathing/ Shower/ Hygiene May shower with protection but do not get wound dressing(s) wet. - Can get cast protector bags at [facility] or [facility] Edema Control - Lymphedema / SCD / Other Elevate legs to the level of the heart or above for 30 minutes daily and/or when sitting, a frequency of: - throughout the day Avoid standing for long periods of time. Exercise regularly Additional Orders / Instructions Follow Nutritious Diet Wound Treatment Wound #17 - Lower Leg Wound Laterality: Right, Lateral Cleanser: Soap and Water Every Other Day/30 Days LAELANI, VASKO ([id number]) [id number] Page 7 of 16 Discharge Instructions: May shower and wash wound with dial antibacterial soap and water prior to dressing change. Cleanser: Wound Cleanser Every Other Day/30 Days Discharge Instructions: Cleanse the wound with wound cleanser prior to applying a clean dressing using gauze sponges, not tissue or cotton balls. Peri-Wound Care: Triamcinolone 15 (g) Every Other Day/30 Days Discharge Instructions: Use triamcinolone 15 (g) as directed Peri-Wound Care: Sween Lotion (Moisturizing lotion) Every Other Day/30 Days Discharge Instructions: Apply moisturizing lotion as directed Prim Dressing: MediHoney Gel, tube 1.5 (oz) Every Other Day/30 Days ary Discharge Instructions: Apply to wound bed as instructed Secondary Dressing: ABD Pad, 5x9 Every Other Day/30 Days Discharge Instructions: Apply over primary dressing as directed. Secondary Dressing: Zetuvit Plus 4x8 in Every Other Day/30 Days Discharge Instructions: Apply over primary dressing as directed. Secured With: Elastic Bandage 4 inch (ACE bandage) Every Other Day/30 Days Discharge Instructions: Secure with ACE bandage as directed. Compression Wrap: Kerlix Roll 4.5x3.1 (in/yd) Every Other Day/30 Days Discharge Instructions: Apply Kerlix and Coban compression as directed. Wound #21 -  Ankle Wound Laterality: Right, Medial Cleanser: Soap and Water Every Other Day/30 Days Discharge Instructions: May shower and wash wound with dial antibacterial soap and water prior to dressing change. Cleanser: Wound Cleanser Every Other Day/30 Days Discharge Instructions: Cleanse the wound with wound cleanser prior to applying a clean dressing using gauze sponges, not tissue or cotton balls. Peri-Wound Care: Triamcinolone 15 (g) Every Other Day/30 Days Discharge Instructions: Use triamcinolone 15 (g) as directed Peri-Wound Care: Sween Lotion (Moisturizing lotion) Every Other Day/30 Days Discharge Instructions: Apply moisturizing lotion as directed Prim Dressing: MediHoney Gel, tube 1.5 (oz) Every Other Day/30 Days ary Discharge Instructions: Apply to wound bed as instructed Secondary Dressing: ABD Pad, 5x9 Every Other  Day/30 Days Discharge Instructions: Apply over primary dressing as directed. Secondary Dressing: Zetuvit Plus 4x8 in Every Other Day/30 Days Discharge Instructions: Apply over primary dressing as directed. Secured With: Elastic Bandage 4 inch (ACE bandage) Every Other Day/30 Days Discharge Instructions: Secure with ACE bandage as directed. Compression Wrap: Kerlix Roll 4.5x3.1 (in/yd) Every Other Day/30 Days Discharge Instructions: Apply Kerlix and Coban compression as directed. Patient Medications llergies: No Known Allergies A Notifications Medication Indication Start End 04/18/2022 lidocaine DOSE topical 5 % ointment - ointment topical Electronic Signature(s) Signed: 04/18/2022 12:45:53 PM By: Fredirick Maudlin MD FACS Entered By: Fredirick Maudlin on 04/18/2022 11:57:03 [name] ([id number]) [id number] Page 8 of 16 -------------------------------------------------------------------------------- Problem List Details Patient Name: Date of Service: [name] [name] 04/18/2022 10:15 A M Medical Record Number: [id number] Patient  Account Number: [id number] Date of Birth/Sex: Treating RN: 1970-11-05 (51 y.o. F) Primary Care Provider: [name] Other Clinician: Referring Provider: Treating Provider/Extender: Elyse Jarvis Weeks in Treatment: 54 Active Problems ICD-10 Encounter Code Description Active Date MDM Diagnosis L97.818 Non-pressure chronic ulcer of other part of right lower leg with other specified 04/17/2021 No Yes severity 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 04/17/2021 No Yes Inactive Problems ICD-10 Code Description Active Date Inactive Date L97.828 Non-pressure chronic ulcer of other part of left lower leg with other specified severity 04/17/2021 04/17/2021 Resolved Problems Electronic Signature(s) Signed: 04/18/2022 11:52:25 AM By: Fredirick Maudlin MD FACS Entered By: Fredirick Maudlin on 04/18/2022 11:52:25 -------------------------------------------------------------------------------- Progress Note Details Patient Name: Date of Service: [name], [name] 04/18/2022 10:15 A M Medical Record Number: [id number] Patient Account Number: [id number] Date of Birth/Sex: Treating RN: [birth year] (51 y.o. F) Primary Care Provider: [name] Other Clinician: Referring Provider: Treating Provider/Extender: Elyse Jarvis Weeks in Treatment: 60 Subjective Chief Complaint Information obtained from Patient the patient is here for evaluation of her bilateral lower extremity sickle cell ulcers 04/17/2021; patient comes in for substantial wounds on the right and left lower leg History of Present Illness (HPI) The following HPI elements were documented for the patient's wound: Location: medial and lateral ankle region on the right and left medial malleolus Quality: Patient reports experiencing a shooting pain to affected area(s). Severity: Patient states wound(s) are getting worse. Duration: right lower extremity bimalleolar ulcers have been present for  approximately 2 years; the rright meedial malleolus ulcer has been there proximally 6 months Timing: Pain in wound is constant (hurts all the time) Context: The wound would happen gradually Associated Signs and Symptoms: Patient reports having increase discharge. 51 year old patient with a history of sickle cell anemia who was last seen by me with ulceration of the right lower extremity above the ankle and was referred to Dr. Leland Johns for a surgical debridement as I was unable to do anything in the office due to excruciating pain. At that stage she was referred from the plastic CHRYSTEN, WOULFE ([id number]) [id number] Page 9 of 16 surgery service to dermatology who treated her for a skin infection with doxycycline and then Levaquin and a local antibiotic ointment. I understand the patient has since developed ulceration on the left ankle both medial and lateral and was now referred back to the wound center as dermatology has finished the management. I do not have any notes from the dermatology department Old notes: 51 year old patient with a history of sickle cell anemia, pain bilateral lower extremities, right lower extremity ulcer and has a history of receiving a skin graft( Theraskin) several months ago. She has been visiting  the wound center [facility] and was seen by Dr. Dellia Nims and Dr. Leland Johns. after prolonged conservator therapy between July 2016 and January 2017. She had been seen by the plastic surgeon and taken to the OR for debridement and application of Theraskin. She had 3 applications of Theraskin and was then treated with collagen. Prior to that she had a history of similar problems in 2014 and was treated conservatively. Had a reflux study done for the right lower extremity in August 2016 without reflux or DVT . Past medical history significant for sickle cell disease, anemia, leg ulcers, cholelithiasis,and has never been a smoker. Once the patient was  discharged on the wound center she says within 2 or 3 weeks the problems recurred and she has been treating it conservatively. since I saw her 3 weeks ago at [facility] she has been unable to get her dressing material but has completed a course of doxycycline. 6/7/ 2017 -- lower extremity venous duplex reflux evaluation was done oo No evidence of SVT or DVT in the RLL. No venous incompetence in the RLL. No further vascular workup is indicated at this time. She was seen by Dr. [name], on 10/04/2015. She agreed with the plan of taking her to the OR for debridement and application of theraskin and would also take biopsies to rule out pyoderma gangrenosum. Follow-up note dated May 31 received and she was status post application of Theraskin to multiple ulcers around the right ankle. Pathology did not show evidence of malignancy or pyoderma gangrenosum. She would continue to see as in the wound clinic for further care and see Dr. Leland Johns as needed. The patient brought the biopsy report and it was consistent with stasis ulcer no evidence of malignancy and the comment was that there was some adjacent neovascularization, fibrosis and patchy perivascular chronic inflammation. 11/15/2015 -- today we applied her first application of Theraskin 11/30/15; TheraSkin #2 12/13/2015 -- she is having a lot of pain locally and is here for possible application of a theraskin today. 01/16/2016 -- the patient has significant pain and has noticed despite in spite of all local care and oral pain medication. It is impossible to debride her in the office. 02/06/2016 -- I do not see any notes from Dr. Iran Planas( the patient has not made a call to the office know as she heard from them) and the only visit to recently was with her PCP Dr. Danella Penton -- I saw her on 01/16/2016 and prescribed 90 tablets of oxycodone 10 mg and did lab work and screening for HIV. the HIV was negative and hemoglobin was 6.3 with a WBC  count of 14.9 and hematocrit of 17.8 with platelets of 561. 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 The bimalleolar ulcers to the right lower extremity have been present for approximately 2 years, the left medial malleolus ulcer has been present approximately 6 months. She has followed with Dr.Thimmappa in the past and has had a total of 3 applications of Theraskin (01/2015, 09/2015, 06/17/16). She has also followed with Dr. Con Memos here in the clinic and has received 2 applications of TheraSkin (11/10/15, 11/30/15). The patient does experience chronic, and is not amenable to debridement. 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 She is not currently on any antibiotic therapy and has not been treated with any recently. 07/17/2016 -- was seen by Dr.  Thimmappa of plastic surgery who saw her 2 weeks postop application of Theraskin #3. She had removed her dressing and asked her to apply silver alginate on alternate days and follow-up back with the wound center. Future debridements and application of skin substitute would have to be done in the hospital due to her high risk for anesthesia. READMISSION 04/17/2021 Patient is now a 51 year old woman that we have had in this clinic for a prolonged period of time and 2016-2017 and then again for 2 visits in February 2018. At that point she had wounds on the right lower leg predominantly medial. She had also been seen by plastic surgery Dr. Leland Johns who I believe took her to the OR for operative debridement and application of TheraSkin in 2017. After she left our clinic she was followed for a very prolonged period of time in the wound care center in [facility] who then referred her ultimately to [facility] where she was seen by Dr. Vernona Rieger. Again taken her to the OR for skin grafting which apparently did not take.  She had multiple other attempts at dressings although I have not really looked over all of these notes in great detail. She has not been seen in a wound care center in about a year. She states over the last year in addition to her right lower leg she has developed wounds on the left lower leg quite extensive. She is using Xeroform to all of these wounds without really any improvement. She also has Medicaid which does not cover wound products. The patient has had vascular work-ups in the past including most recently on 03/28/2021 showing biphasic waveforms on the right triphasic at the PTA and biphasic at the dorsalis pedis on the left. She was unable to tolerate any degree of compression to do ABIs. Unfortunately TBI's were also not done. She had venous reflux studies done in 2017. This did not show any evidence of a DVT or SVT and no venous incompetence was noted in the right leg at the time this was the only side with the wound As noted I did not look all over her old records. She apparently had a course of HBO and [facility] although I am not sure what the indication would have been. In any case she developed seizures and terminated treatment earlier. She is generally much more disabled than when we last saw her in clinic. She can no longer walk pretty much wheelchair-bound because predominantly of pain in the left hip. 04/24/2021; the patient tolerated the wraps we put on. We used Santyl and Hydrofera Blue under compression. I brought her back for a nurse visit for a change in dressing. With Medicaid we will have a hard time getting anything paid for and hence the need for compression. She arrives in clinic with all the wounds looking somewhat better in terms of surface 12/20; circumferential wound on the right from the lateral to the medial. She has open areas on the left medial and left lateral x2 on all of this with the same surface. This does not look completely healthy although she does have some  epithelialization. She is not complaining of a lot of pain which is unusual for her sickle ulcers. I have not looked over her extensive records from [facility]. She had recent arterial studies and has a history of venous reflux studies I will need to look these over although I do not believe she has significant arterial disease 2023 05/22/2021; patient's wound areas measure slightly smaller. Still a  lot of drainage coming from the right we have been using Hydrofera Blue and Santyl with some improvement in the wound surfaces. She tells me she will be getting transfused later in the week for her underlying sickle cell anemia I have looked over her recent arterial studies which were done in the fall. This was in November and showed biphasic and triphasic waveforms but she could not tolerate ABIs because of pressure and unfortunately TBI's were not done. She has not had recent venous reflux studies that I can see 1/10; not much change about the same surface area. This has a yellowish surface to it very gritty. We have been using Santyl and Hydrofera Blue for a prolonged period. Culture I did last week showed methicillin sensitive staph aureus "rare". Our intake nurse reports greenish drainage which may be the Hydrofera Blue itself 1/17; wounds are continue to measure smaller although I am not sure about the accuracy here. Especially the areas on the right are covered in what looks to be a nonviable surface although she does have some epithelialization. Similarly she has areas on the left medial and left lateral ankle area which appear to have a better surface and perhaps are slightly smaller. We have been using Santyl and Hydrofera Blue. She cannot tolerate mechanical debridements HUYEN, PERAZZO ([id number]) [id number] Page 10 of 16 She went for her reflux studies which showed significant reflux at the greater saphenous vein at the saphenofemoral junction as well as the greater  saphenous vein in the proximal calf on the left she had reflux in the thigh and the common femoral vein and supra vein Fishel vein reflux in the greater saphenous vein. I will have vein and vascular look at this. My thoughts have been that these are likely sickle wounds. I looked through her old records from [facility] wound care center and then when she graduated to [facility] wound care center where she saw Dr. Zigmund Daniel and Dr. Vernona Rieger. Although I can see she had reflux studies done I do not see that she actually saw a vein and vascular. I went over the fact that she had operative debridements and actual skin grafting that did not take. I do not think these wounds have ever really progressed towards healing 1/31;Substantial wounds on the right ankle area. Hyper granulated very gritty adherent debris on the surface. 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 On the left she has no DVT or SVT deep vein reflux in the common femoral vein. Superficial vein reflux in the greater saphenous vein on although the vein diameter is not really all that large. I do not think there is anything that can be done with these although I am going to send her for consultation to vein and vascular. 2/7; Wound exam; substantial wound area on the right posterior ankle area and areas on the left medial ankle and left lateral ankle. I was able to debride the left medial ankle last week fairly aggressively and it is back this week to a completely nonviable surface She will see vascular surgery this Friday and I would like them to review the venous studies  and also any  comments on her arterial status. If they do not see an issue here I am going to refer her to plastic surgery for an operative debridement perhaps intraoperative ACell or Integra. Eventually she will require a deep tissue culture again 2/14; substantial wound area on the right posterior ankle, medial ankle. We have been using silver alginate The patient was seen by vein and vascular she had both venous reflux studies and arterial studies. 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 She had no evidence of superficial venous thrombosis. She did have superficial vein reflux at the saphenofemoral junction and the greater saphenous vein. On the left no evidence of a DVT no evidence of superficial venous throat thrombosis she did have deep vein reflux in the common femoral vein and superficial vein reflux in the greater saphenous vein but these were not felt to be amenable to ablation. In terms of arterial studies she had triphasic and wife biphasic wave waveforms bilaterally not felt to have a significant arterial issue. I do not get the feeling that they felt that any part of her nonhealing wounds were related to either arterial or venous issues. They did note that she had venous reflux at the right at the [facility] and [facility]. And also on the left there were reflux in the deep system at the common femoral vein and greater saphenous vein in the proximal thigh. Nothing amenable to ablation. 2/20; she is making some decent progress on the right where there is nice skin between the 2 open areas on the right ankle. The surfaces here do not look viable yet there is some surrounding epithelialization. She still has a small area on the left medial ankle area. Hyper-granulated Jody's away always 2/28 patient has an appointment with plastic surgery on 3/8. We will see her back on 3/9. She may have to call us to get the area redressed. We've been  using Santyl under silver alginate. We made a nice improvement on the left medial ankle. The larger wounds on the right also looks somewhat better in terms of epithelialization although I think they could benefit from an aggressive debridement if plastic surgery would be willing to do that. Perhaps placement of Integra or a cell 07/26/2021: She saw Dr. Claudia Desanctis yesterday. He raised the question as to whether or not this might be pyoderma and wanted to wait until that question was answered by dermatology before proceeding with any sort of operative debridement. We have continue to use Santyl under silver alginate with Kerlix and Coban wraps. Overall, her wounds appear to be continuing to contract and epithelialize, with some granulation tissue present. There continues to be some slough on all wound surfaces. 08/09/2021: She has not been able to get an appointment with dermatology because apparently the offices in [place] do not accept Medicaid. She is looking into whether or not she can be seen at the main [facility] dermatology clinic. This is necessary because plastic surgery is concerned that her wounds might represent pyoderma and they did not want to do any procedure until that was clarified. We have been using Santyl under silver alginate with Kerlix and Coban wraps. Today, there was a greater amount of drainage on her dressings with a slight green discoloration and significant odor. Despite this, her wounds continue to contract and epithelialize. There is pale granulation tissue present and actually, on the left  medial ankle, the granulation tissue is a bit hypertrophic. 08/16/2021: Last week, I took a culture and this grew back rare methicillin-resistant Staph aureus and rare corynebacterium. The MRSA was sensitive to gentamicin which we began applying topically on an empiric basis. This week, her wounds are a bit smaller and the drainage and odor are less. Her primary  care provider is working on assisting the patient with a dermatology evaluation. She has been in silver alginate over the gentamicin that was started last week along with Kerlix and Coban wraps. 08/23/2021: Because she has Medicaid, we have been unable to get her into see any dermatologist in the [place] to rule out pyoderma gangrenosum, which was a requirement from plastic surgery prior to any sort of debridement and grafting. Despite this, however, all of her wounds continue to get smaller. The wound on her left medial ankle is nearly closed. There is no odor from the wounds, although she still accumulates a modest amount of drainage on her dressings. 08/30/2021: The lateral right ankle wound and the medial left ankle wound are a bit smaller today. The medial right ankle wound is about the same size. They are less tender. We have still been unable to get her into dermatology. 09/06/2021: All of the wounds are about the same size today. She continues to endorse minimal pain. I communicated with Dr. Claudia Desanctis in plastic surgery regarding our issues getting a dermatology appointment; he was out of town but indicated that he would look into perhaps performing the biopsy in his office and will have his office contact her. 09/14/2021: The patient has an appointment in dermatology, but it is not until October. Her wounds are roughly the same; she continues to have very thick purulent-looking drainage on her dressings. 09/20/2021: The left medial wound is nearly closed and just has a bit of accumulated eschar on the surface. The right medial and lateral ankle wounds are perhaps a little bit smaller. They continue to have a very pale surface with accumulation of thin slough. 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 I did not think [employer] was indicated based on this. She is getting topical mupirocin with Prisma silver collagen. 10/04/2021: The patient was not seen in clinic last week due to  childcare coverage issues. In the interim, the left medial leg wound has closed. The right sided leg wounds are smaller. There is more granulation tissue coming through, particularly on the lateral wound. The surface remains somewhat gritty. We have been applying topical mupirocin and Prisma silver collagen. 10/11/2021: The left medial leg wound remains closed. She does complain of some anesthetic sensation to the area. Both of the right-sided leg wounds are smaller but still have accumulated slough. 10/18/2021: Both right-sided leg wounds are minimally smaller this week. She still continues to accumulate slough and has thick drainage on her dressings. 10/23/2021: Both wounds continue to contract. There is still slough buildup. She has been approved for a keratin-based skin substitute trial product but it will not be available until next week. 10/30/2021: The wounds are about the same to perhaps slightly smaller. There is still continued slough buildup. Unfortunately, the rep for the keratin based product did not show up today and did not answer his phone when called. 11/08/2021: The wounds are little bit smaller today. She continues to have thick drainage but the surfaces are relatively clean with just a little bit of slough accumulation. She reported to me today that she is unable to completely flex  her left ankle and on examination it seems this is potentially related to scar JAJAIRA, RUIS ([id number]) [id number] Page 11 of 16 tissue from her wounds. We do have the ProgenaMatrix trial product available for her today. 11/15/2021: Both wounds are smaller today. There is some slough accumulation on the surfaces, but the medial wound, in particular looks like it is filling in and is less deep. She did hear from physical therapy and she is going to start working with them on July 11. She is here for her second application of the trial skin substitute, ProgenaMatrix. 11/22/2021:  Both wounds continue to contract, the medial more dramatically than the lateral. Both wounds have a layer of slough on the surface, but underneath this, the gritty fibrous tissue has a little bit more of a pink cast to it rather than being as pale as it has been. 11/29/2021: The wounds are roughly the same size this week, perhaps a millimeter or 2 smaller. The medial wound has filled in and is nearly flush with the surrounding skin surface. She continues to have a lot of slough accumulation on both surfaces. 12/06/2021: No significant change to her wounds, but she has a new opening on her dorsal foot, just distal to the right lateral ankle wound. The area on her left medial ankle that reopened looks a little bit larger today. She has quite a bit of pain associated with the new wound. 12/12/2021: Her wounds look about the same but the new opening on her right lateral dorsal foot is a little bit bigger. She continues to have a fair amount of pain with this wound. 12/26/2021: The left medial ankle wound is tiny and superficial. She has 2 areas of crusting on her left lateral ankle, however, that appear to be threatening to open again. Her right medial ankle wound is a little bit smaller today but still continues to accumulate thick rubbery slough. The new dorsal foot wound is exquisitely painful but there is no odor or purulent drainage. No erythema or induration. The right lateral ankle wound looks about the same today, again with thick rubbery slough. 01/03/2022: The left medial ankle wound has closed again. Both right ankle wounds appear to be about the same size with thick rubbery slough. The dorsal foot wound on the right continues to be quite painful and she stated that she did not want any debridement of that site today. 01/10/2022: No real change to any of her wounds. She continues to accumulate thick slough. The dorsal foot wound has merged with the lateral malleolar wound. She is experiencing  significant pain in the dorsal foot portion of the ulcer. 01/16/2022: Absolutely no change or progress in her wounds. 01/24/2022: Her wounds are unchanged. She continues to build up slough and the wounds on her dorsal right foot are still exquisitely tender. 01/31/2022: The wounds actually measure a little bit narrower today. They still have thick slough on the surface but the underlying tissue seems a little less fibrotic. We changed to Iodosorb last week. 02/07/2022: Wounds continue to slowly epithelialize around the parameters. She has less pain today. She still accumulates a fairly substantial layer of slough. 02/15/2022: No change to the wounds overall. The measured a little bit larger today per the intake nurse. She continues to have substantial slough accumulation and her pain is a little bit worse. 02/21/2022. No change at all to her wounds. I do not see that plastic surgery has received her referral yet. 02/28/2022: The wounds remain unchanged. They are  dry and fibrotic with accumulation of slough and eschar. She did receive an appointment to see plastic surgery on October 23, but the office called her back and indicated they needed to reschedule and that the next available appointment was not until December. The patient became angry and decided she did not want to see this plastics group. 10/19; the patient sees Dr. Lovett Calender again next week. She is using Medihoney as the primary dressing changing this herself. 03/21/2022: She saw Dr. Jaclynn Guarneri at the wound care center at [facility]. Dr. Jaclynn Guarneri was also unable to debride her, secondary to pain and is planning to take her to the operating room for operative debridement and potential skin substitute placement. Her wounds are unchanged with thick yellow slough and a fibrotic base. She says they are too painful to be debrided. In addition, yesterday her hemoglobin was 4 and she received 2 units packed red blood cell  transfusion. 04/04/2022: Her operation at [facility] is scheduled for December 15. She continues to use Medihoney on her wounds. They are a little bit less painful today and she is willing to entertain the possibility of debridement. The wounds measured a little bit larger in all dimensions today but the layer of slough is not as thick as usual. 04/18/2022: Her wounds actually measured a little bit smaller today and the extension onto the dorsal foot from the lateral wound has healed. They are less tender and there is significantly less slough present as compared to prior visits. Patient History Information obtained from Patient. Family History Diabetes - Mother, Lung Disease - Mother, No family history of Cancer, Heart Disease, Hereditary Spherocytosis, Hypertension, Kidney Disease, Seizures, Stroke, Thyroid Problems, Tuberculosis. Social History Never smoker, Marital Status - Married, Alcohol Use - Never, Drug Use - No History, Caffeine Use - Daily. Medical History Eyes Denies history of Cataracts, Glaucoma, Optic Neuritis Ear/Nose/Mouth/Throat Denies history of Chronic sinus problems/congestion, Middle ear problems Hematologic/Lymphatic Patient has history of Anemia, Sickle Cell Disease Denies history of Hemophilia, Human Immunodeficiency Virus, Lymphedema Respiratory Denies history of Aspiration, Asthma, Chronic Obstructive Pulmonary Disease (COPD), Pneumothorax, Sleep Apnea, Tuberculosis Cardiovascular Denies history of Angina, Arrhythmia, Congestive Heart Failure, Coronary Artery Disease, Deep Vein Thrombosis, Hypertension, Hypotension, Myocardial Infarction, Peripheral Arterial Disease, Peripheral Venous Disease, Phlebitis, Vasculitis Gastrointestinal Denies history of Cirrhosis , Colitis, Crohnoos, Hepatitis A, Hepatitis B, Hepatitis C Endocrine Denies history of Type I Diabetes, Type II Diabetes Genitourinary Denies history of End Stage Renal  Disease Immunological QUIANA, COBAUGH ([id number]) [id number] Page 12 of 16 Denies history of Lupus Erythematosus, Raynaudoos, Scleroderma Integumentary (Skin) Denies history of History of Burn Musculoskeletal Denies history of Gout, Rheumatoid Arthritis, Osteoarthritis, Osteomyelitis Neurologic Patient has history of Neuropathy - right foot intermittant Denies history of Dementia, Quadriplegia, Paraplegia, Seizure Disorder Oncologic Denies history of Received Chemotherapy, Received Radiation Psychiatric Denies history of Anorexia/bulimia, Confinement Anxiety Hospitalization/Surgery History - c section x2. - left breast lumpectomy. - iandD right ankle with theraskin. Medical A Surgical History Notes nd Constitutional Symptoms (General Health) H/O miscarriage Cardiovascular bradycardia Gastrointestinal cholilithiasis Objective Constitutional No acute distress. Vitals Time Taken: 10:10 AM, Height: 67 in, Temperature: 98 F, Pulse: 90 bpm, Respiratory Rate: 18 breaths/min, Blood Pressure: 127/74 mmHg. Respiratory Normal work of breathing on room air. General Notes: 04/18/2022: Her wounds actually measured a little bit smaller today and the extension onto the dorsal foot from the lateral wound has healed. They are less tender and there is significantly less slough present as compared to prior visits. Integumentary (  Hair, Skin) Wound #17 status is Open. Original cause of wound was Gradually Appeared. The date acquired was: 10/05/2012. The wound has been in treatment 52 weeks. The wound is located on the Right,Lateral Lower Leg. The wound measures 6.5cm length x 5.3cm width x 0.1cm depth; 27.057cm^2 area and 2.706cm^3 volume. There is Fat Layer (Subcutaneous Tissue) exposed. There is no tunneling or undermining noted. There is a large amount of serosanguineous drainage noted. The wound margin is distinct with the outline attached to the wound base. There is  small (1-33%) pink, pale granulation within the wound bed. There is a large (67-100%) amount of necrotic tissue within the wound bed including Adherent Slough. The periwound skin appearance had no abnormalities noted for texture. The periwound skin appearance had no abnormalities noted for moisture. The periwound skin appearance had no abnormalities noted for color. Periwound temperature was noted as No Abnormality. Wound #21 status is Open. Original cause of wound was Gradually Appeared. The date acquired was: 06/26/2021. The wound has been in treatment 42 weeks. The wound is located on the Right,Medial Ankle. The wound measures 8cm length x 3.5cm width x 0.1cm depth; 21.991cm^2 area and 2.199cm^3 volume. There is Fat Layer (Subcutaneous Tissue) exposed. There is no tunneling or undermining noted. There is a medium amount of serosanguineous drainage noted. The wound margin is distinct with the outline attached to the wound base. There is small (1-33%) pink granulation within the wound bed. There is a large (67-100%) amount of necrotic tissue within the wound bed including Adherent Slough. The periwound skin appearance had no abnormalities noted for texture. The periwound skin appearance had no abnormalities noted for moisture. The periwound skin appearance had no abnormalities noted for color. Periwound temperature was noted as No Abnormality. 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 There was a Selective/Open Wound Non-Viable Tissue Debridement with a total area of 9 sq cm performed by Fredirick Maudlin, MD. With the following instrument(s): Curette to remove Non-Viable tissue/material. Material removed includes Eschar and Slough and after achieving pain  control using Lidocaine 5% topical ointment. No specimens were taken. A time out was conducted at 10:22, prior to the start of the procedure. A Minimum amount of bleeding was controlled with Pressure. The procedure was tolerated well. [address] ODENA, MCQUAID ([id number]) [id number] Page 13 of 16 Debridement Measurements: 6.5cm length x 5.3cm width x 0.1cm depth; 2.706cm^3 volume. Character of Wound/Ulcer Post Debridement is improved. Post procedure Diagnosis Wound #17: Same as Pre-Procedure General Notes: scribed for Dr. Celine Ahr by Adline Peals, RN. Wound #21 Pre-procedure diagnosis of Wound #21 is a Sickle Cell Lesion located on the Right,Medial Ankle .Severity of Tissue Pre Debridement is: Fat layer exposed. There was a Selective/Open Wound Non-Viable Tissue Debridement with a total area of 14 sq cm performed by Fredirick Maudlin, MD. With the following instrument(s): Curette to remove Non-Viable tissue/material. Material removed includes Eschar and Slough and after achieving pain control using Lidocaine 5% topical ointment. No specimens were taken. A time out was conducted at 10:22, prior to the start of the procedure. A Minimum amount of bleeding was controlled with Pressure. The procedure was tolerated well. Post Debridement Measurements: 8cm length x 3.5cm width x 0.1cm depth; 2.199cm^3 volume. Character of Wound/Ulcer Post Debridement is improved. Severity of Tissue Post Debridement is: Fat layer  exposed. Post procedure Diagnosis Wound #21: Same as Pre-Procedure General Notes: scribed for Dr. Celine Ahr by Adline Peals, RN. Plan Follow-up Appointments: Return appointment in 3 weeks. - Dr. Celine Ahr - room 3 - 05/09/22 Anesthetic: (In clinic) Topical Lidocaine 5% applied to wound bed - Used in clinic Bathing/ Shower/ Hygiene: May shower with protection but do not get wound dressing(s) wet. - Can get cast protector bags at [facility] or [facility] Edema Control -  Lymphedema / SCD / Other: Elevate legs to the level of the heart or above for 30 minutes daily and/or when sitting, a frequency of: - throughout the day Avoid standing for long periods of time. Exercise regularly Additional Orders / Instructions: Follow Nutritious Diet The following medication(s) was prescribed: lidocaine topical 5 % ointment ointment topical was prescribed at facility WOUND #17: - Lower Leg Wound Laterality: Right, Lateral Cleanser: Soap and Water Every Other Day/30 Days Discharge Instructions: May shower and wash wound with dial antibacterial soap and water prior to dressing change. Cleanser: Wound Cleanser Every Other Day/30 Days Discharge Instructions: Cleanse the wound with wound cleanser prior to applying a clean dressing using gauze sponges, not tissue or cotton balls. Peri-Wound Care: Triamcinolone 15 (g) Every Other Day/30 Days Discharge Instructions: Use triamcinolone 15 (g) as directed Peri-Wound Care: Sween Lotion (Moisturizing lotion) Every Other Day/30 Days Discharge Instructions: Apply moisturizing lotion as directed Prim Dressing: MediHoney Gel, tube 1.5 (oz) Every Other Day/30 Days ary Discharge Instructions: Apply to wound bed as instructed Secondary Dressing: ABD Pad, 5x9 Every Other Day/30 Days Discharge Instructions: Apply over primary dressing as directed. Secondary Dressing: Zetuvit Plus 4x8 in Every Other Day/30 Days Discharge Instructions: Apply over primary dressing as directed. Secured With: Elastic Bandage 4 inch (ACE bandage) Every Other Day/30 Days Discharge Instructions: Secure with ACE bandage as directed. Com pression Wrap: Kerlix Roll 4.5x3.1 (in/yd) Every Other Day/30 Days Discharge Instructions: Apply Kerlix and Coban compression as directed. WOUND #21: - Ankle Wound Laterality: Right, Medial Cleanser: Soap and Water Every Other Day/30 Days Discharge Instructions: May shower and wash wound with dial antibacterial soap and water prior  to dressing change. Cleanser: Wound Cleanser Every Other Day/30 Days Discharge Instructions: Cleanse the wound with wound cleanser prior to applying a clean dressing using gauze sponges, not tissue or cotton balls. Peri-Wound Care: Triamcinolone 15 (g) Every Other Day/30 Days Discharge Instructions: Use triamcinolone 15 (g) as directed Peri-Wound Care: Sween Lotion (Moisturizing lotion) Every Other Day/30 Days Discharge Instructions: Apply moisturizing lotion as directed Prim Dressing: MediHoney Gel, tube 1.5 (oz) Every Other Day/30 Days ary Discharge Instructions: Apply to wound bed as instructed Secondary Dressing: ABD Pad, 5x9 Every Other Day/30 Days Discharge Instructions: Apply over primary dressing as directed. Secondary Dressing: Zetuvit Plus 4x8 in Every Other Day/30 Days Discharge Instructions: Apply over primary dressing as directed. Secured With: Elastic Bandage 4 inch (ACE bandage) Every Other Day/30 Days Discharge Instructions: Secure with ACE bandage as directed. Com pression Wrap: Kerlix Roll 4.5x3.1 (in/yd) Every Other Day/30 Days Discharge Instructions: Apply Kerlix and Coban compression as directed. 04/18/2022: Her wounds actually measured a little bit smaller today and the extension onto the dorsal foot from the lateral wound has healed. They are less tender and there is significantly less slough present as compared to prior visits. I used a curette to debride slough and eschar off of her wounds. We will continue to use Medihoney as recommended by her plastic surgeon at [facility]. Her operation is scheduled for December 15. As  we are on a 2-week interval with our visits, I will simply see her after she undergoes her procedure. Electronic Signature(s) Signed: 04/18/2022 11:57:52 AM By: Fredirick Maudlin MD FACS Frohna, Legrand Rams ([id number]) [id number] Page 14 of 16 Entered By: Fredirick Maudlin on 04/18/2022  11:57:52 -------------------------------------------------------------------------------- HxROS Details Patient Name: Date of Service: [name] [name] 04/18/2022 10:15 A M Medical Record Number: [id number] Patient Account Number: [id number] Date of Birth/Sex: Treating RN: 11/26/1970 (51 y.o. F) Primary Care Provider: [name] Other Clinician: Referring Provider: Treating Provider/Extender: Elyse Jarvis Weeks in Treatment: 49 Information Obtained From Patient Constitutional Symptoms (General Health) Medical History: Past Medical History Notes: H/O miscarriage Eyes Medical History: Negative for: Cataracts; Glaucoma; Optic Neuritis Ear/Nose/Mouth/Throat Medical History: Negative for: Chronic sinus problems/congestion; Middle ear problems Hematologic/Lymphatic Medical History: Positive for: Anemia; Sickle Cell Disease Negative for: Hemophilia; Human Immunodeficiency Virus; Lymphedema Respiratory Medical History: Negative for: Aspiration; Asthma; Chronic Obstructive Pulmonary Disease (COPD); Pneumothorax; Sleep Apnea; Tuberculosis Cardiovascular Medical History: Negative for: Angina; Arrhythmia; Congestive Heart Failure; Coronary Artery Disease; Deep Vein Thrombosis; Hypertension; Hypotension; Myocardial Infarction; Peripheral Arterial Disease; Peripheral Venous Disease; Phlebitis; Vasculitis Past Medical History Notes: bradycardia Gastrointestinal Medical History: Negative for: Cirrhosis ; Colitis; Crohns; Hepatitis A; Hepatitis B; Hepatitis C Past Medical History Notes: cholilithiasis Endocrine Medical History: Negative for: Type I Diabetes; Type II Diabetes Genitourinary Medical History: Negative for: End Stage Renal Disease Immunological Medical History: Negative for: Lupus Erythematosus; Raynauds; Scleroderma QUINNLYN, HEARNS ([id number]) [id number] Page 15 of 16 Integumentary (Skin) Medical  History: Negative for: History of Burn Musculoskeletal Medical History: Negative for: Gout; Rheumatoid Arthritis; Osteoarthritis; Osteomyelitis Neurologic Medical History: Positive for: Neuropathy - right foot intermittant Negative for: Dementia; Quadriplegia; Paraplegia; Seizure Disorder Oncologic Medical History: Negative for: Received Chemotherapy; Received Radiation Psychiatric Medical History: Negative for: Anorexia/bulimia; Confinement Anxiety Immunizations Pneumococcal Vaccine: Received Pneumococcal Vaccination: No Implantable Devices None Hospitalization / Surgery History Type of Hospitalization/Surgery c section x2 left breast lumpectomy iandD right ankle with theraskin Family and Social History Cancer: No; Diabetes: Yes - Mother; Heart Disease: No; Hereditary Spherocytosis: No; Hypertension: No; Kidney Disease: No; Lung Disease: Yes - Mother; Seizures: No; Stroke: No; Thyroid Problems: No; Tuberculosis: No; Never smoker; Marital Status - Married; Alcohol Use: Never; Drug Use: No History; Caffeine Use: Daily; Financial Concerns: No; Food, Clothing or Shelter Needs: No; Support System Lacking: No; Transportation Concerns: No Electronic Signature(s) Signed: 04/18/2022 12:45:53 PM By: Fredirick Maudlin MD FACS Entered By: Fredirick Maudlin on 04/18/2022 11:55:29 -------------------------------------------------------------------------------- SuperBill Details Patient Name: Date of Service: [name], Nenzel [name] 04/18/2022 Medical Record Number: [id number] Patient Account Number: [id number] Date of Birth/Sex: Treating RN: 11/20/1970 (51 y.o. F) Primary Care Provider: [name] Other Clinician: Referring Provider: Treating Provider/Extender: Elyse Jarvis Weeks in Treatment: 52 Diagnosis Coding ICD-10 Codes Code Description L97.818 Non-pressure chronic ulcer of other part of right lower leg with other specified severity D57.1 Sickle-cell disease  without crisis Facility Procedures QUANTAVIA, FRITH ([id number]): CPT4 Code Description [id number] - DEBRIDE WOUND 1ST 20 SQ CM OR < ICD-10 Diagnosis Description L97.818 Non-pressure chronic ulcer of other part of right lower leg with o [id number] Page 16 of 16: Modifier Quantity 1 ther specified severity LAMERLE, JABS ([id number]): [id number] - DEBRIDE WOUND EA ADDL 20 SQ CM ICD-10 Diagnosis Description L97.818 Non-pressure chronic ulcer of other part of right lower leg with o [id number] Page 16 of 16: 1 ther specified severity Physician Procedures : CPT4 Code Description Modifier [id number] - WC PHYS LEVEL 3 -  EST PT 25 ICD-10 Diagnosis Description L97.818 Non-pressure chronic ulcer of other part of right lower leg with other specified severity 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 Quantity: 1 : [id number] - WC PHYS DEBR WO ANESTH 20 SQ CM ICD-10 Diagnosis Description L97.818 Non-pressure chronic ulcer of other part of right lower leg with other specified severity Quantity: 1 : [id number] - WC PHYS DEBR WO ANESTH EA ADD 20 CM ICD-10 Diagnosis Description L97.818 Non-pressure chronic ulcer of other part of right lower leg with other specified severity Quantity: 1 Electronic Signature(s) Signed: 04/18/2022 11:58:13 AM By: Fredirick Maudlin MD FACS Entered By: Fredirick Maudlin on 04/18/2022 11:58:12

## 2022-04-19 NOTE — Progress Notes (Signed)
IOANA, LOUKS ([id number]) [id number] Page 1 of 10 Visit Report for 04/18/2022 Arrival Information Details Patient Name: Date of Service: [name] [name] 04/18/2022 10:15 A M Medical Record Number: [id number] Patient Account Number: [id number] Date of Birth/Sex: Treating RN: [birth year] (51 y.o. [name], [name] Primary Care Ronica Vivian: [name] Other Clinician: Referring [name]: Treating [name]/Extender: [name] in Treatment: 35 Visit Information History Since Last Visit Added or deleted any medications: No Patient Arrived: [name] Any new allergies or adverse reactions: No Arrival Time: 10:09 Had a fall or experienced change in No Accompanied By: self activities of daily living that may affect Transfer Assistance: None risk of falls: Patient Identification Verified: Yes Signs or symptoms of abuse/neglect since last visito No Secondary Verification Process Completed: Yes Hospitalized since last visit: No Patient Requires Transmission-Based Precautions: No Implantable device outside of the clinic excluding No Patient Has Alerts: No cellular tissue based products placed in the center since last visit: Has Dressing in Place as Prescribed: Yes Has Compression in Place as Prescribed: Yes Pain Present Now: Yes Electronic Signature(s) Signed: 04/18/2022 5:21:53 PM By: Adline Peals Entered By: Adline Peals on 04/18/2022 10:10:31 -------------------------------------------------------------------------------- Encounter Discharge Information Details Patient Name: Date of Service: [name], FA [name] 04/18/2022 10:15 A M Medical Record Number: [id number] Patient Account Number: [id number] Date of Birth/Sex: Treating RN: [birth year] (51 y.o. [name] Primary Care Demarqus Jocson: [name] Other Clinician: Referring Derrien Anschutz: Treating Jasmeet Manton/Extender: [name]  in Treatment: 68 Encounter Discharge Information Items Post Procedure Vitals Discharge Condition: Stable Temperature (F): 98 Ambulatory Status: Cane Pulse (bpm): 90 Discharge Destination: Home Respiratory Rate (breaths/min): 18 Transportation: Private Auto Blood Pressure (mmHg): 127/74 Accompanied By: self Schedule Follow-up Appointment: Yes Clinical Summary of Care: Patient Declined Electronic Signature(s) Signed: 04/18/2022 5:21:53 PM By: Adline Peals Entered By: Adline Peals on 04/18/2022 11:07:56 [name] ([id number]) [id number] Page 2 of 10 -------------------------------------------------------------------------------- Lower Extremity Assessment Details Patient Name: Date of Service: [name] [name] 04/18/2022 10:15 A M Medical Record Number: [id number] Patient Account Number: [id number] Date of Birth/Sex: Treating RN: April 30, 1971 (51 y.o. [name] Primary Care Kadden Osterhout: [name] Other Clinician: Referring Johnpatrick Jenny: Treating Kalob Bergen/Extender: [name] in Treatment: 52 Edema Assessment Assessed: [Left: No] [Right: No] Edema: [Left: Ye] [Right: s] Calf Left: Right: Point of Measurement: 33 cm From Medial Instep 28 cm 31.2 cm Ankle Left: Right: Point of Measurement: 10 cm From Medial Instep 18.9 cm 19.8 cm Vascular Assessment Pulses: Dorsalis Pedis Palpable: [Right:Yes] Electronic Signature(s) Signed: 04/18/2022 5:21:53 PM By: Adline Peals Entered By: Adline Peals on 04/18/2022 10:13:44 -------------------------------------------------------------------------------- Multi Wound Chart Details Patient Name: Date of Service: [name], FA [name] 04/18/2022 10:15 A M Medical Record Number: [id number] Patient Account Number: [id number] Date of Birth/Sex: Treating RN: [birth year] (51 y.o. F) Primary Care Ricki Clack: [name] Other Clinician: Referring  Gurkaran Rahm: Treating Maleke Feria/Extender: [name] in Treatment: 32 Vital Signs Height(in): 67 Pulse(bpm): 90 Weight(lbs): Blood Pressure(mmHg): 127/74 Body Mass Index(BMI): Temperature(F): 98 Respiratory Rate(breaths/min): 18 [17:Photos:] [N/A:N/A [id number] Page 3 of 10] Right, Lateral Lower Leg Right, Medial Ankle N/A Wound Location: Gradually Appeared Gradually Appeared N/A Wounding Event: Sickle Cell Lesion Sickle Cell Lesion N/A Primary Etiology: N/A Venous Leg Ulcer N/A Secondary Etiology: Anemia, Sickle Cell Disease, Anemia, Sickle Cell Disease, N/A Comorbid History: Neuropathy Neuropathy 10/05/2012 06/26/2021 N/A Date Acquired: 35 42 N/A [name] of Treatment: Open Open N/A Wound Status: No No N/A Wound Recurrence: Yes  No N/A Clustered Wound: 6.5x5.3x0.1 8x3.5x0.1 N/A Measurements L x W x D (cm) 27.057 21.991 N/A A (cm) : rea 2.706 2.199 N/A Volume (cm) : 85.60% 24.10% N/A % Reduction in A rea: 85.60% 24.10% N/A % Reduction in Volume: Full Thickness Without Exposed Full Thickness Without Exposed N/A Classification: Support Structures Support Structures Large Medium N/A Exudate A mount: Serosanguineous Serosanguineous N/A Exudate Type: red, brown red, brown N/A Exudate Color: Distinct, outline attached Distinct, outline attached N/A Wound Margin: Small (1-33%) Small (1-33%) N/A Granulation A mount: Pink, Pale Pink N/A Granulation Quality: Large (67-100%) Large (67-100%) N/A Necrotic A mount: Fat Layer (Subcutaneous Tissue): Yes Fat Layer (Subcutaneous Tissue): Yes N/A Exposed Structures: Fascia: No Fascia: No Tendon: No Tendon: No Muscle: No Muscle: No Joint: No Joint: No Bone: No Bone: No Small (1-33%) Small (1-33%) N/A Epithelialization: Debridement - Selective/Open Wound Debridement - Selective/Open Wound N/A Debridement: Pre-procedure Verification/Time Out 10:22 10:22  N/A Taken: Lidocaine 5% topical ointment Lidocaine 5% topical ointment N/A Pain Control: Necrotic/Eschar, Psychologist, prison and probation services, [employer] N/A Tissue Debrided: Non-Viable Tissue Non-Viable Tissue N/A Level: 9 14 N/A Debridement A (sq cm): rea Curette Curette N/A Instrument: Minimum Minimum N/A Bleeding: Pressure Pressure N/A Hemostasis A chieved: Procedure was tolerated well Procedure was tolerated well N/A Debridement Treatment Response: 6.5x5.3x0.1 8x3.5x0.1 N/A Post Debridement Measurements L x W x D (cm) 2.706 2.199 N/A Post Debridement Volume: (cm) No Abnormalities Noted No Abnormalities Noted N/A Periwound Skin Texture: No Abnormalities Noted No Abnormalities Noted N/A Periwound Skin Moisture: No Abnormalities Noted No Abnormalities Noted N/A Periwound Skin Color: No Abnormality No Abnormality N/A Temperature: Debridement Debridement N/A Procedures Performed: Treatment Notes Wound #17 (Lower Leg) Wound Laterality: Right, Lateral Cleanser Soap and Water Discharge Instruction: May shower and wash wound with dial antibacterial soap and water prior to dressing change. Wound Cleanser Discharge Instruction: Cleanse the wound with wound cleanser prior to applying a clean dressing using gauze sponges, not tissue or cotton balls. Peri-Wound Care Triamcinolone 15 (g) Discharge Instruction: Use triamcinolone 15 (g) as directed Sween Lotion (Moisturizing lotion) Discharge Instruction: Apply moisturizing lotion as directed Topical Primary Dressing MediHoney Gel, tube 1.5 (oz) Discharge Instruction: Apply to wound bed as instructed Secondary Dressing ABD Pad, 5x9 Discharge Instruction: Apply over primary dressing as directed. Zetuvit Plus 4x8 in Discharge Instruction: Apply over primary dressing as directed. NEKIA, MAXHAM ([id number]) [id number] Page 4 of 10 Secured With Elastic Bandage 4 inch (ACE bandage) Discharge Instruction: Secure  with ACE bandage as directed. Compression Wrap Kerlix Roll 4.5x3.1 (in/yd) Discharge Instruction: Apply Kerlix and Coban compression as directed. Compression Stockings Add-Ons Wound #21 (Ankle) Wound Laterality: Right, Medial Cleanser Soap and Water Discharge Instruction: May shower and wash wound with dial antibacterial soap and water prior to dressing change. Wound Cleanser Discharge Instruction: Cleanse the wound with wound cleanser prior to applying a clean dressing using gauze sponges, not tissue or cotton balls. Peri-Wound Care Triamcinolone 15 (g) Discharge Instruction: Use triamcinolone 15 (g) as directed Sween Lotion (Moisturizing lotion) Discharge Instruction: Apply moisturizing lotion as directed Topical Primary Dressing MediHoney Gel, tube 1.5 (oz) Discharge Instruction: Apply to wound bed as instructed Secondary Dressing ABD Pad, 5x9 Discharge Instruction: Apply over primary dressing as directed. Zetuvit Plus 4x8 in Discharge Instruction: Apply over primary dressing as directed. Secured With Elastic Bandage 4 inch (ACE bandage) Discharge Instruction: Secure with ACE bandage as directed. Compression Wrap Kerlix Roll 4.5x3.1 (in/yd) Discharge Instruction: Apply Kerlix and Coban compression as directed. Compression Stockings Add-Ons Electronic Signature(s) Signed: 04/18/2022 11:54:09 AM  By: Fredirick Maudlin MD FACS Entered By: Fredirick Maudlin on 04/18/2022 11:54:09 -------------------------------------------------------------------------------- Multi-Disciplinary Care Plan Details Patient Name: Date of Service: [name] URO [name], [name] [name] 04/18/2022 10:15 A M Medical Record Number: [id number] Patient Account Number: [id number] Date of Birth/Sex: Treating RN: [birth year] (51 y.o. [name] Primary Care Geovanie Winnett: [name] Other Clinician: Referring Hawa Henly: Treating Kalyb Pemble/Extender: [name] in Treatment: [address],  [place] ([id number]) [id number] Page 5 of 10 Multidisciplinary Care Plan reviewed with physician Active Inactive Venous Leg Ulcer Nursing Diagnoses: Actual venous Insuffiency (use after diagnosis is confirmed) Knowledge deficit related to disease process and management Goals: Patient will maintain optimal edema control Date Initiated: 06/26/2021 Target Resolution Date: 05/19/2022 Goal Status: Active Interventions: Assess peripheral edema status every visit. Compression as ordered Treatment Activities: Therapeutic compression applied : 06/26/2021 Notes: Wound/Skin Impairment Nursing Diagnoses: Impaired tissue integrity Goals: Patient/caregiver will verbalize understanding of skin care regimen Date Initiated: 04/17/2021 Target Resolution Date: 05/19/2022 Goal Status: Active Ulcer/skin breakdown will have a volume reduction of 30% by week 4 Date Initiated: 04/17/2021 Date Inactivated: 05/29/2021 Target Resolution Date: 05/15/2021 Goal Status: Met Ulcer/skin breakdown will have a volume reduction of 50% by week 8 Date Initiated: 05/29/2021 Date Inactivated: 06/26/2021 Target Resolution Date: 06/26/2021 Goal Status: Unmet Unmet Reason: venous reflux Interventions: Assess patient/caregiver ability to obtain necessary supplies Assess patient/caregiver ability to perform ulcer/skin care regimen upon admission and as needed Assess ulceration(s) every visit Provide education on ulcer and skin care Treatment Activities: Topical wound management initiated : 04/17/2021 Notes: 06/08/21: Left leg wounds greater than 30% volume reduction, right leg acute infection. Electronic Signature(s) Signed: 04/18/2022 5:21:53 PM By: Adline Peals Entered By: Adline Peals on 04/18/2022 10:23:39 -------------------------------------------------------------------------------- Pain Assessment Details Patient Name: Date of Service: [name], [name] [name] 04/18/2022 10:15 A  M Medical Record Number: [id number] Patient Account Number: [id number] Date of Birth/Sex: Treating RN: 1970-11-10 (51 y.o. [name] Primary Care Marice Angelino: [name] Other Clinician: Referring Laurinda Carreno: Treating Katrina Daddona/Extender: [name] in Treatment: 13 Active Problems Location of Pain Severity and Description of Pain JAYLA, MACKIE ([id number]) [id number] Page 6 of 10 Patient Has Paino Yes Site Locations Pain Location: Pain in Ulcers Rate the pain. Current Pain Level: 7 Pain Management and Medication Current Pain Management: Electronic Signature(s) Signed: 04/18/2022 5:21:53 PM By: Adline Peals Entered By: Adline Peals on 04/18/2022 10:10:51 -------------------------------------------------------------------------------- Patient/Caregiver Education Details Patient Name: Date of Service: [name], FA [name] 11/30/2023andnbsp10:15 [place] Record Number: [id number] Patient Account Number: [id number] Date of Birth/Gender: Treating RN: Jul 07, 1970 (51 y.o. [name] Primary Care Physician: [name] Other Clinician: Referring Physician: Treating Physician/Extender: [name] in Treatment: 79 Education Assessment Education Provided To: Patient Education Topics Provided Wound/Skin Impairment: Methods: Explain/Verbal Responses: Reinforcements needed, State content correctly Electronic Signature(s) Signed: 04/18/2022 5:21:53 PM By: Adline Peals Entered By: Adline Peals on 04/18/2022 10:23:52 -------------------------------------------------------------------------------- Wound Assessment Details Patient Name: Date of Service: [name], FA [name] 04/18/2022 10:15 A M Medical Record Number: [id number] Patient Account Number: [id number] Date of Birth/Sex: Treating RN: 01-Jan-1971 (51 y.o. [name] [place], Legrand Rams  ([id number]) [id number] Page 7 of 10 Primary Care Sarahanne Novakowski: [name] Other Clinician: Referring Kortlynn Poust: Treating Jasiya Markie/Extender: [name] in Treatment: 87 Wound Status Wound Number: 17 Primary Etiology: Sickle Cell Lesion Wound Location: Right, Lateral Lower Leg Wound Status: Open Wounding Event: Gradually Appeared Comorbid History: Anemia, Sickle Cell Disease, Neuropathy Date Acquired: 10/05/2012 [name] Of Treatment: 52  Clustered Wound: Yes Photos Wound Measurements Length: (cm) 6.5 Width: (cm) 5.3 Depth: (cm) 0.1 Area: (cm) 27.057 Volume: (cm) 2.706 % Reduction in Area: 85.6% % Reduction in Volume: 85.6% Epithelialization: Small (1-33%) Tunneling: No Undermining: No Wound Description Classification: Full Thickness Without Exposed Support Structures Wound Margin: Distinct, outline attached Exudate Amount: Large Exudate Type: Serosanguineous Exudate Color: red, brown Foul Odor After Cleansing: No Slough/Fibrino Yes Wound Bed Granulation Amount: Small (1-33%) Exposed Structure Granulation Quality: Pink, Pale Fascia Exposed: No Necrotic Amount: Large (67-100%) Fat Layer (Subcutaneous Tissue) Exposed: Yes Necrotic Quality: Adherent Slough Tendon Exposed: No Muscle Exposed: No Joint Exposed: No Bone Exposed: No Periwound Skin Texture Texture Color No Abnormalities Noted: Yes No Abnormalities Noted: Yes Moisture Temperature / Pain No Abnormalities Noted: Yes Temperature: No Abnormality Treatment Notes Wound #17 (Lower Leg) Wound Laterality: Right, Lateral Cleanser Soap and Water Discharge Instruction: May shower and wash wound with dial antibacterial soap and water prior to dressing change. Wound Cleanser Discharge Instruction: Cleanse the wound with wound cleanser prior to applying a clean dressing using gauze sponges, not tissue or cotton balls. Peri-Wound Care Triamcinolone 15  (g) Discharge Instruction: Use triamcinolone 15 (g) as directed Sween Lotion (Moisturizing lotion) Discharge Instruction: Apply moisturizing lotion as directed Topical EQUILLA, QUE ([id number]) [id number] Page 8 of 10 Primary Dressing MediHoney Gel, tube 1.5 (oz) Discharge Instruction: Apply to wound bed as instructed Secondary Dressing ABD Pad, 5x9 Discharge Instruction: Apply over primary dressing as directed. Zetuvit Plus 4x8 in Discharge Instruction: Apply over primary dressing as directed. Secured With Elastic Bandage 4 inch (ACE bandage) Discharge Instruction: Secure with ACE bandage as directed. Compression Wrap Kerlix Roll 4.5x3.1 (in/yd) Discharge Instruction: Apply Kerlix and Coban compression as directed. Compression Stockings Add-Ons Electronic Signature(s) Signed: 04/18/2022 5:21:53 PM By: Adline Peals Signed: 04/18/2022 6:00:06 PM By: Dellie Catholic RN Entered By: Dellie Catholic on 04/18/2022 10:16:09 -------------------------------------------------------------------------------- Wound Assessment Details Patient Name: Date of Service: [name], FA [name] 04/18/2022 10:15 A M Medical Record Number: [id number] Patient Account Number: [id number] Date of Birth/Sex: Treating RN: 21-Jan-1971 (51 y.o. [name] Primary Care Orby Tangen: [name] Other Clinician: Referring Nikolis Berent: Treating Tamika Nou/Extender: [name] in Treatment: 52 Wound Status Wound Number: 21 Primary Etiology: Sickle Cell Lesion Wound Location: Right, Medial Ankle Secondary Etiology: Venous Leg Ulcer Wounding Event: Gradually Appeared Wound Status: Open Date Acquired: 06/26/2021 Comorbid History: Anemia, Sickle Cell Disease, Neuropathy [name] Of Treatment: 42 Clustered Wound: No Photos Wound Measurements Length: (cm) 8 Width: (cm) 3.5 Depth: (cm) 0.1 Area: (cm) 21.991 Volume: (cm) 2.199 % Reduction in  Area: 24.1% % Reduction in Volume: 24.1% Epithelialization: Small (1-33%) Tunneling: No Undermining: No Wound Description YAILEEN, HOFFERBER ([id number]) Classification: Full Thickness Without Exposed Support Structures Wound Margin: Distinct, outline attached Exudate Amount: Medium Exudate Type: Serosanguineous Exudate Color: red, brown [id number] Page 9 of 10 Foul Odor After Cleansing: No Slough/Fibrino Yes Wound Bed Granulation Amount: Small (1-33%) Exposed Structure Granulation Quality: Pink Fascia Exposed: No Necrotic Amount: Large (67-100%) Fat Layer (Subcutaneous Tissue) Exposed: Yes Necrotic Quality: Adherent Slough Tendon Exposed: No Muscle Exposed: No Joint Exposed: No Bone Exposed: No Periwound Skin Texture Texture Color No Abnormalities Noted: Yes No Abnormalities Noted: Yes Moisture Temperature / Pain No Abnormalities Noted: Yes Temperature: No Abnormality Treatment Notes Wound #21 (Ankle) Wound Laterality: Right, Medial Cleanser Soap and Water Discharge Instruction: May shower and wash wound with dial antibacterial soap and water prior to dressing change. Wound Cleanser Discharge Instruction: Cleanse the wound with wound cleanser  prior to applying a clean dressing using gauze sponges, not tissue or cotton balls. Peri-Wound Care Triamcinolone 15 (g) Discharge Instruction: Use triamcinolone 15 (g) as directed Sween Lotion (Moisturizing lotion) Discharge Instruction: Apply moisturizing lotion as directed Topical Primary Dressing MediHoney Gel, tube 1.5 (oz) Discharge Instruction: Apply to wound bed as instructed Secondary Dressing ABD Pad, 5x9 Discharge Instruction: Apply over primary dressing as directed. Zetuvit Plus 4x8 in Discharge Instruction: Apply over primary dressing as directed. Secured With Elastic Bandage 4 inch (ACE bandage) Discharge Instruction: Secure with ACE bandage as directed. Compression Wrap Kerlix Roll  4.5x3.1 (in/yd) Discharge Instruction: Apply Kerlix and Coban compression as directed. Compression Stockings Add-Ons Electronic Signature(s) Signed: 04/18/2022 5:21:53 PM By: Adline Peals Signed: 04/18/2022 6:00:06 PM By: Dellie Catholic RN Entered By: Dellie Catholic on 04/18/2022 10:16:36 [name] ([id number]) [id number] Page 10 of 10 -------------------------------------------------------------------------------- Vitals Details Patient Name: Date of Service: [name] [name] 04/18/2022 10:15 A M Medical Record Number: [id number] Patient Account Number: [id number] Date of Birth/Sex: Treating RN: 1970-06-28 (51 y.o. [name] Primary Care Monti Jilek: [name] Other Clinician: Referring Yanelly Cantrelle: Treating Jamekia Gannett/Extender: [name] in Treatment: 39 Vital Signs Time Taken: 10:10 Temperature (F): 98 Height (in): 67 Pulse (bpm): 90 Respiratory Rate (breaths/min): 18 Blood Pressure (mmHg): 127/74 Reference Range: 80 - 120 mg / dl Electronic Signature(s) Signed: 04/18/2022 5:21:53 PM By: Adline Peals Entered By: Adline Peals on 04/18/2022 10:10:43

## 2022-05-01 ENCOUNTER — Telehealth: Payer: Self-pay | Admitting: Family Medicine

## 2022-05-01 ENCOUNTER — Other Ambulatory Visit: Payer: Self-pay | Admitting: Family Medicine

## 2022-05-01 DIAGNOSIS — G894 Chronic pain syndrome: Secondary | ICD-10-CM

## 2022-05-01 MED ORDER — OXYCODONE HCL 10 MG PO TABS: 10.0000 mg | ORAL_TABLET | ORAL | 0 refills | Status: DC

## 2022-05-01 NOTE — Progress Notes (Signed)
Reviewed PDMP substance reporting system prior to prescribing opiate medications. No inconsistencies noted.  Meds ordered this encounter  Medications   Oxycodone HCl 10 MG TABS    Sig: Take 1 tablet (10 mg total) by mouth every 4 (four) hours while awake.    Dispense:  90 tablet    Refill:  0    Order Specific Question:   Supervising Provider    Answer:   JEGEDE, OLUGBEMIGA E [[id number]]   Karlton Maya Moore Ileta Ofarrell  APRN, MSN, FNP-C [facility] [address]  [place], [place] [address] [phone]  

## 2022-05-01 NOTE — Telephone Encounter (Signed)
Caller & Relationship to patient:  MRN #  [id number]   Call Back Number:   Date of Last Office Visit: 04/08/2022     Date of Next Office Visit: 06/18/2022    Medication(s) to be Refilled: Oxycodone   Preferred Pharmacy:   ** Please notify patient to allow 48-72 hours to process** **Let patient know to contact pharmacy at the end of the day to make sure medication is ready. ** **If patient has not been seen in a year or longer, book an appointment **Advise to use MyChart for refill requests OR to contact their pharmacy

## 2022-05-09 ENCOUNTER — Encounter (HOSPITAL_BASED_OUTPATIENT_CLINIC_OR_DEPARTMENT_OTHER): Payer: Medicaid Other | Attending: General Surgery | Admitting: General Surgery

## 2022-05-09 DIAGNOSIS — L97818 Non-pressure chronic ulcer of other part of right lower leg with other specified severity: Secondary | ICD-10-CM | POA: Insufficient documentation

## 2022-05-09 DIAGNOSIS — D571 Sickle-cell disease without crisis: Secondary | ICD-10-CM | POA: Diagnosis not present

## 2022-05-09 NOTE — Progress Notes (Signed)
KYNLEA, BLACKSTON ([id number]) [id number] Page 1 of 11 Visit Report for 05/09/2022 Arrival Information Details Patient Name: Date of Service: [name] [name] 05/09/2022 10:15 A M Medical Record Number: [id number] Patient Account Number: [id number] Date of Birth/Sex: Treating RN: [birth year] (51 y.o. [name] Primary Care [name]: [name] Other Clinician: Referring [name]: Treating [name]/Extender: [name] in Treatment: 68 Visit Information History Since Last Visit Added or deleted any medications: No Patient Arrived: [name] Any new allergies or adverse reactions: No Arrival Time: 09:44 Had a fall or experienced change in No Accompanied By: self activities of daily living that may affect Transfer Assistance: None risk of falls: Patient Identification Verified: Yes Signs or symptoms of abuse/neglect since last visito No Secondary Verification Process Completed: Yes Hospitalized since last visit: No Patient Requires Transmission-Based Precautions: No Implantable device outside of the clinic excluding No Patient Has Alerts: No cellular tissue based products placed in the center since last visit: Has Dressing in Place as Prescribed: Yes Pain Present Now: Yes Electronic Signature(s) Signed: 05/09/2022 4:53:43 PM By: [name] Entered By: [name] on 05/09/2022 09:46:34 -------------------------------------------------------------------------------- Clinic Level of Care Assessment Details Patient Name: Date of Service: [name] [name] 05/09/2022 10:15 A M Medical Record Number: [id number] Patient Account Number: [id number] Date of Birth/Sex: Treating RN: 26-Apr-1971 (51 y.o. [name] Primary Care Jazzlyn Huizenga: [name] Other Clinician: Referring Maryjane Benedict: Treating [name]/Extender: [name] in Treatment: 68 Clinic Level of Care  Assessment Items TOOL 4 Quantity Score X- 1 0 Use when only an EandM is performed on FOLLOW-UP visit ASSESSMENTS - Nursing Assessment / Reassessment X- 1 10 Reassessment of Co-morbidities (includes updates in patient status) X- 1 5 Reassessment of Adherence to Treatment Plan ASSESSMENTS - Wound and Skin A ssessment / Reassessment X - Simple Wound Assessment / Reassessment - one wound 1 5 _0  - 0 Complex Wound Assessment / Reassessment - multiple wounds _1  - 0 Dermatologic / Skin Assessment (not related to wound area) ASSESSMENTS - Focused Assessment X- 1 5 Circumferential Edema Measurements - multi extremities _2  - 0 Nutritional Assessment / Counseling / Intervention [name] ([id number]) [id number] Page 2 of 11 X- 1 5 Lower Extremity Assessment (monofilament, tuning fork, pulses) _3  - 0 Peripheral Arterial Disease Assessment (using hand held doppler) ASSESSMENTS - Ostomy and/or Continence Assessment and Care _4  - 0 Incontinence Assessment and Management _5  - 0 Ostomy Care Assessment and Management (repouching, etc.) PROCESS - Coordination of Care X - Simple Patient / Family Education for ongoing care 1 15 _6  - 0 Complex (extensive) Patient / Family Education for ongoing care X- 1 10 Staff obtains Programmer, systems, Records, T Results / Process Orders est _7  - 0 Staff telephones HHA, Nursing Homes / Clarify orders / etc _8  - 0 Routine Transfer to another Facility (non-emergent condition) _9  - 0 Routine Hospital Admission (non-emergent condition) _10  - 0 New Admissions / Biomedical engineer / Ordering NPWT Apligraf, etc. , _11  - 0 Emergency Hospital Admission (emergent condition) X- 1 10 Simple Discharge Coordination _12  - 0 Complex (extensive) Discharge Coordination PROCESS - Special Needs _13  - 0 Pediatric / Minor Patient Management _14  - 0 Isolation Patient Management _15  - 0 Hearing / Language / Visual special needs _16  -  0 Assessment of Community assistance (transportation, D/C planning, etc.) _17  - 0 Additional assistance / Altered mentation _18  - 0 Support Surface(s) Assessment (bed, cushion, seat, etc.) INTERVENTIONS - Wound Cleansing / Measurement X - Simple Wound Cleansing -  one wound 1 5 _0  - 0 Complex Wound Cleansing - multiple wounds X- 1 5 Wound Imaging (photographs - any number of wounds) _1  - 0 Wound Tracing (instead of photographs) X- 1 5 Simple Wound Measurement - one wound _2  - 0 Complex Wound Measurement - multiple wounds INTERVENTIONS - Wound Dressings _3  - 0 Small Wound Dressing one or multiple wounds X- 1 15 Medium Wound Dressing one or multiple wounds _4  - 0 Large Wound Dressing one or multiple wounds <VVOHYWVPXTGGYIRS>_8<\/NIOEVOJJKKXFGHWE>_9  - 0 Application of Medications - topical <HBZJIRCVELFYBOFB>_5<\/ZWCHENIDPOEUMPNT>_6  - 0 Application of Medications - injection INTERVENTIONS - Miscellaneous _7  - 0 External ear exam _8  - 0 Specimen Collection (cultures, biopsies, blood, body fluids, etc.) _9  - 0 Specimen(s) / Culture(s) sent or taken to Lab for analysis _10  - 0 Patient Transfer (multiple staff / Civil Service fast streamer / Similar devices) _11  - 0 Simple Staple / Suture removal (25 or less) _12  - 0 Complex Staple / Suture removal (26 or more) _13  - 0 Hypo / Hyperglycemic Management (close monitor of Blood Glucose) [name] ([id number]) [id number] Page 3 of 11 _14  - 0 Ankle / Brachial Index ([name]) - do not check if billed separately X- 1 5 Vital Signs Has the patient been seen at the hospital within the last three years: Yes Total Score: 100 Level Of Care: New/Established - Level 3 Electronic Signature(s) Signed: 05/09/2022 4:53:43 PM By: [name] Entered By: [name] on 05/09/2022 10:20:10 -------------------------------------------------------------------------------- Encounter Discharge Information Details Patient Name: Date of Service: [name], [name] 05/09/2022 10:15 A M Medical Record  Number: [id number] Patient Account Number: [id number] Date of Birth/Sex: Treating RN: 02/19/1971 (51 y.o. [name] Primary Care Anye Brose: [name] Other Clinician: Referring Delio Slates: Treating Javon Hupfer/Extender: [name] in Treatment: 61 Encounter Discharge Information Items Discharge Condition: Stable Ambulatory Status: Cane Discharge Destination: Home Transportation: Private Auto Accompanied By: self Schedule Follow-up Appointment: Yes Clinical Summary of Care: Patient Declined Electronic Signature(s) Signed: 05/09/2022 4:53:43 PM By: [name] By: [name] on 05/09/2022 10:20:39 -------------------------------------------------------------------------------- Lower Extremity Assessment Details Patient Name: Date of Service: [name], [name] [name] 05/09/2022 10:15 A M Medical Record Number: [id number] Patient Account Number: [id number] Date of Birth/Sex: Treating RN: Aug 24, 1970 (51 y.o. [name] Primary Care Maveric Debono: [name] Other Clinician: Referring Marianela Mandrell: Treating Shelly Spenser/Extender: [name] in Treatment: 55 Edema Assessment Assessed: [Left: No] [Right: No] Edema: [Left: Ye] [Right: s] Calf Left: Right: Point of Measurement: 33 cm From Medial Instep 28 cm 30.8 cm Ankle Left: Right: Point of Measurement: 10 cm From Medial Instep 18.9 cm 20 cm Vascular Assessment Schwake, Enriqueta ([id number]) [Right:[id number] Page 4 of 11] Pulses: Dorsalis Pedis Palpable: [Right:Yes] Electronic Signature(s) Signed: 05/09/2022 4:53:43 PM By: [name] Entered By: [name] on 05/09/2022 09:50:46 -------------------------------------------------------------------------------- Multi Wound Chart Details Patient Name: Date of Service: [name], [name] 05/09/2022 10:15 A M Medical Record Number: [id number] Patient  Account Number: [id number] Date of Birth/Sex: Treating RN: 1970-11-22 (51 y.o. F) Primary Care Daman Steffenhagen: [name] Other Clinician: Referring Toula Miyasaki: Treating Woody Kronberg/Extender: [name] in Treatment: 21 Vital Signs Height(in): 56 Pulse(bpm): 28 Weight(lbs): Blood Pressure(mmHg): 135/73 Body Mass Index(BMI): Temperature(F): 98.4 Respiratory Rate(breaths/min): 18 [17:Photos:] [N/A:N/A] Right, Lateral Lower Leg Right, Medial Ankle N/A Wound Location: Gradually Appeared Gradually Appeared N/A Wounding Event: [name] Cell Lesion [name] Cell Lesion N/A Primary Etiology: N/A Venous Leg Ulcer N/A Secondary Etiology: Anemia, [name] Cell Disease, Anemia, [name] Cell Disease, N/A Comorbid  History: Neuropathy Neuropathy 10/05/2012 06/26/2021 N/A Date Acquired: [age] N/A [name] of Treatment: Open Open N/A Wound Status: No No N/A Wound Recurrence: Yes No N/A Clustered Wound: 6.6x5.1x0.2 7.8x3.8x0.1 N/A Measurements L x W x D (cm) 26.437 23.279 N/A A (cm) : rea 5.287 2.328 N/A Volume (cm) : 85.90% 19.70% N/A % Reduction in Area: 71.90% 19.70% N/A % Reduction in Volume: Full Thickness Without Exposed Full Thickness Without Exposed N/A Classification: Support Structures Support Structures Medium Medium N/A Exudate Amount: Serosanguineous Serosanguineous N/A Exudate Type: red, brown red, brown N/A Exudate Color: Distinct, outline attached Distinct, outline attached N/A Wound Margin: Small (1-33%) Small (1-33%) N/A Granulation Amount: Pink, Pale Pink N/A Granulation Quality: Large (67-100%) Large (67-100%) N/A Necrotic Amount: Fat Layer (Subcutaneous Tissue): Yes Fat Layer (Subcutaneous Tissue): Yes N/A Exposed Structures: Fascia: No Fascia: No Tendon: No Tendon: No Muscle: No Muscle: No Joint: No Joint: No Bone: No Bone: No Small (1-33%) Small (1-33%) N/A Epithelialization: Scarring: Yes Scarring: Yes N/A Periwound  Skin Texture: No Abnormalities Noted No Abnormalities Noted N/A Periwound Skin Moisture: No Abnormalities Noted No Abnormalities Noted N/A Periwound Skin ColorEula [name] ([id number]) [id number] Page 5 of 11 No Abnormality No Abnormality N/A Temperature: Treatment Notes Wound #17 (Lower Leg) Wound Laterality: Right, Lateral Cleanser Soap and Water Discharge Instruction: May shower and wash wound with dial antibacterial soap and water prior to dressing change. Wound Cleanser Discharge Instruction: Cleanse the wound with wound cleanser prior to applying a clean dressing using gauze sponges, not tissue or cotton balls. Peri-Wound Care Triamcinolone 15 (g) Discharge Instruction: Use triamcinolone 15 (g) as directed Sween Lotion (Moisturizing lotion) Discharge Instruction: Apply moisturizing lotion as directed Topical Mupirocin Ointment Discharge Instruction: Apply Mupirocin (Bactroban) as instructed Primary Dressing MediHoney Gel, tube 1.5 (oz) Discharge Instruction: Apply to wound bed as instructed Secondary Dressing ABD Pad, 5x9 Discharge Instruction: Apply over primary dressing as directed. Secured With Elastic Bandage 4 inch (ACE bandage) Discharge Instruction: Secure with ACE bandage as directed. Compression Wrap Kerlix Roll 4.5x3.1 (in/yd) Discharge Instruction: Apply Kerlix and Coban compression as directed. Compression Stockings Add-Ons Wound #21 (Ankle) Wound Laterality: Right, Medial Cleanser Soap and Water Discharge Instruction: May shower and wash wound with dial antibacterial soap and water prior to dressing change. Wound Cleanser Discharge Instruction: Cleanse the wound with wound cleanser prior to applying a clean dressing using gauze sponges, not tissue or cotton balls. Peri-Wound Care Triamcinolone 15 (g) Discharge Instruction: Use triamcinolone 15 (g) as directed Sween Lotion (Moisturizing lotion) Discharge Instruction:  Apply moisturizing lotion as directed Topical Mupirocin Ointment Discharge Instruction: Apply Mupirocin (Bactroban) as instructed Primary Dressing MediHoney Gel, tube 1.5 (oz) Discharge Instruction: Apply to wound bed as instructed Secondary Dressing ABD Pad, 5x9 Discharge Instruction: Apply over primary dressing as directed. Secured With Elastic Bandage 4 inch (ACE bandage) Discharge Instruction: Secure with ACE bandage as directed. [name], [name] ([id number]) [id number] Page 6 of 11 Compression Wrap Kerlix Roll 4.5x3.1 (in/yd) Discharge Instruction: Apply Kerlix and Coban compression as directed. Compression Stockings Add-Ons Electronic Signature(s) Signed: 05/09/2022 10:56:30 AM By: Fredirick Maudlin MD FACS Entered By: Fredirick Maudlin on 05/09/2022 10:56:30 -------------------------------------------------------------------------------- Multi-Disciplinary Care Plan Details Patient Name: Date of Service: [name], [place] [name] 05/09/2022 10:15 A M Medical Record Number: [id number] Patient Account Number: [id number] Date of Birth/Sex: Treating RN: 1970-09-16 (51 y.o. [name] Primary Care Eufelia Veno: [name] Other Clinician: Referring Vanya Carberry: Treating Alaila Pillard/Extender: [name] in Treatment: 64 Multidisciplinary Care Plan reviewed with physician Active Inactive Venous  Leg Ulcer Nursing Diagnoses: Actual venous Insuffiency (use after diagnosis is confirmed) Knowledge deficit related to disease process and management Goals: Patient will maintain optimal edema control Date Initiated: 06/26/2021 Target Resolution Date: 06/21/2022 Goal Status: Active Interventions: Assess peripheral edema status every visit. Compression as ordered Treatment Activities: Therapeutic compression applied : 06/26/2021 Notes: Wound/Skin Impairment Nursing Diagnoses: Impaired tissue integrity Goals: Patient/caregiver will  verbalize understanding of skin care regimen Date Initiated: 04/17/2021 Target Resolution Date: 06/21/2022 Goal Status: Active Ulcer/skin breakdown will have a volume reduction of 30% by week 4 Date Initiated: 04/17/2021 Date Inactivated: 05/29/2021 Target Resolution Date: 05/15/2021 Goal Status: Met Ulcer/skin breakdown will have a volume reduction of 50% by week 8 Date Initiated: 05/29/2021 Date Inactivated: 06/26/2021 Target Resolution Date: 06/26/2021 Goal Status: Unmet Unmet Reason: venous reflux Interventions: Assess patient/caregiver ability to obtain necessary supplies Assess patient/caregiver ability to perform ulcer/skin care regimen upon admission and as needed Assess ulceration(s) every visit [name], [name] ([id number]) [id number] Page 7 of 11 Provide education on ulcer and skin care Treatment Activities: Topical wound management initiated : 04/17/2021 Notes: 06/08/21: Left leg wounds greater than 30% volume reduction, right leg acute infection. Electronic Signature(s) Signed: 05/09/2022 4:53:43 PM By: [name] By: [name] on 05/09/2022 09:59:42 -------------------------------------------------------------------------------- Pain Assessment Details Patient Name: Date of Service: [name], [place] [name] 05/09/2022 10:15 A M Medical Record Number: [id number] Patient Account Number: [id number] Date of Birth/Sex: Treating RN: 08-Feb-1971 (51 y.o. [name] Primary Care Deshay Blumenfeld: [name] Other Clinician: Referring Keelyn Monjaras: Treating Smith Potenza/Extender: [name] in Treatment: 45 Active Problems Location of Pain Severity and Description of Pain Patient Has Paino Yes Site Locations Pain Location: Pain in Ulcers Duration of the Pain. Constant / Intermittento Constant Rate the pain. Current Pain Level: 7 Pain Management and Medication Current Pain Management: Electronic  Signature(s) Signed: 05/09/2022 4:53:43 PM By: [name] Entered By: [name] on 05/09/2022 09:47:12 -------------------------------------------------------------------------------- Patient/Caregiver Education Details Patient Name: Date of Service: [name], [name] 12/21/2023andnbsp10:15 A M Medical Record Number: [id number] Patient Account Number: [id number] Date of Birth/Gender: Treating RN: 1970-09-03 (51 y.o. [name] Primary Care Physician: [name] Other Clinician: Referring Physician: Treating Physician/Extender: [name] [place], Legrand Rams ([id number]) [id number] Page 8 of 11 [name] in Treatment: 16 Education Assessment Education Provided To: Patient Education Topics Provided Wound/Skin Impairment: Methods: Explain/Verbal Responses: Reinforcements needed, State content correctly Electronic Signature(s) Signed: 05/09/2022 4:53:43 PM By: [name] Entered By: [name] on 05/09/2022 09:59:59 -------------------------------------------------------------------------------- Wound Assessment Details Patient Name: Date of Service: [name], [name] 05/09/2022 10:15 A M Medical Record Number: [id number] Patient Account Number: [id number] Date of Birth/Sex: Treating RN: 06/27/1970 (51 y.o. [name] Primary Care Roland Lipke: [name] Other Clinician: Referring Krew Hortman: Treating Aletha Allebach/Extender: [name] in Treatment: 55 Wound Status Wound Number: 17 Primary Etiology: [name] Cell Lesion Wound Location: Right, Lateral Lower Leg Wound Status: Open Wounding Event: Gradually Appeared Comorbid History: Anemia, [name] Cell Disease, Neuropathy Date Acquired: 10/05/2012 [name] Of Treatment: 55 Clustered Wound: Yes Photos Wound Measurements Length: (cm) 6.6 Width: (cm) 5.1 Depth: (cm) 0.2 Area: (cm) 26.437 Volume: (cm)  5.287 % Reduction in Area: 85.9% % Reduction in Volume: 71.9% Epithelialization: Small (1-33%) Tunneling: No Undermining: No Wound Description Classification: Full Thickness Without Exposed Support Structures Wound Margin: Distinct, outline attached Exudate Amount: Medium Exudate Type: Serosanguineous Exudate Color: red, brown Foul Odor After Cleansing: No Slough/Fibrino Yes Wound Bed [name] ([id number]) [id number] Page 9 of  11 Granulation Amount: Small (1-33%) Exposed Structure Granulation Quality: Pink, Pale Fascia Exposed: No Necrotic Amount: Large (67-100%) Fat Layer (Subcutaneous Tissue) Exposed: Yes Necrotic Quality: Adherent Slough Tendon Exposed: No Muscle Exposed: No Joint Exposed: No Bone Exposed: No Periwound Skin Texture Texture Color No Abnormalities Noted: No No Abnormalities Noted: Yes Scarring: Yes Temperature / Pain Temperature: No Abnormality Moisture No Abnormalities Noted: Yes Treatment Notes Wound #17 (Lower Leg) Wound Laterality: Right, Lateral Cleanser Soap and Water Discharge Instruction: May shower and wash wound with dial antibacterial soap and water prior to dressing change. Wound Cleanser Discharge Instruction: Cleanse the wound with wound cleanser prior to applying a clean dressing using gauze sponges, not tissue or cotton balls. Peri-Wound Care Triamcinolone 15 (g) Discharge Instruction: Use triamcinolone 15 (g) as directed Sween Lotion (Moisturizing lotion) Discharge Instruction: Apply moisturizing lotion as directed Topical Mupirocin Ointment Discharge Instruction: Apply Mupirocin (Bactroban) as instructed Primary Dressing MediHoney Gel, tube 1.5 (oz) Discharge Instruction: Apply to wound bed as instructed Secondary Dressing ABD Pad, 5x9 Discharge Instruction: Apply over primary dressing as directed. Secured With Elastic Bandage 4 inch (ACE bandage) Discharge Instruction: Secure with ACE  bandage as directed. Compression Wrap Kerlix Roll 4.5x3.1 (in/yd) Discharge Instruction: Apply Kerlix and Coban compression as directed. Compression Stockings Add-Ons Electronic Signature(s) Signed: 05/09/2022 4:53:43 PM By: [name] Entered By: [name] on 05/09/2022 09:53:34 -------------------------------------------------------------------------------- Wound Assessment Details Patient Name: Date of Service: [name], [place] [name] 05/09/2022 10:15 A M Medical Record Number: [id number] Patient Account Number: [id number] Date of Birth/Sex: Treating RN: [birth year] (51 y.o. [name] Primary Care Jenifer Struve: [name] Other Clinician: Referring Bentlee Drier: Treating Jordan Caraveo/Extender: [name] [place], Legrand Rams ([id number]) [id number] Page 10 of 11 [name] in Treatment: 55 Wound Status Wound Number: 21 Primary Etiology: [name] Cell Lesion Wound Location: Right, Medial Ankle Secondary Etiology: Venous Leg Ulcer Wounding Event: Gradually Appeared Wound Status: Open Date Acquired: 06/26/2021 Comorbid History: Anemia, [name] Cell Disease, Neuropathy [name] Of Treatment: 45 Clustered Wound: No Photos Wound Measurements Length: (cm) 7.8 Width: (cm) 3.8 Depth: (cm) 0.1 Area: (cm) 23.279 Volume: (cm) 2.328 % Reduction in Area: 19.7% % Reduction in Volume: 19.7% Epithelialization: Small (1-33%) Tunneling: No Undermining: No Wound Description Classification: Full Thickness Without Exposed Support Structures Wound Margin: Distinct, outline attached Exudate Amount: Medium Exudate Type: Serosanguineous Exudate Color: red, brown Foul Odor After Cleansing: No Slough/Fibrino Yes Wound Bed Granulation Amount: Small (1-33%) Exposed Structure Granulation Quality: Pink Fascia Exposed: No Necrotic Amount: Large (67-100%) Fat Layer (Subcutaneous Tissue) Exposed: Yes Necrotic Quality: Adherent Slough Tendon  Exposed: No Muscle Exposed: No Joint Exposed: No Bone Exposed: No Periwound Skin Texture Texture Color No Abnormalities Noted: No No Abnormalities Noted: Yes Scarring: Yes Temperature / Pain Temperature: No Abnormality Moisture No Abnormalities Noted: Yes Treatment Notes Wound #21 (Ankle) Wound Laterality: Right, Medial Cleanser Soap and Water Discharge Instruction: May shower and wash wound with dial antibacterial soap and water prior to dressing change. Wound Cleanser Discharge Instruction: Cleanse the wound with wound cleanser prior to applying a clean dressing using gauze sponges, not tissue or cotton balls. Peri-Wound Care Triamcinolone 15 (g) Discharge Instruction: Use triamcinolone 15 (g) as directed Sween Lotion (Moisturizing lotion) Discharge Instruction: Apply moisturizing lotion as directed Topical Mupirocin Ointment [name] ([id number]) [id number] Page 11 of 11 Discharge Instruction: Apply Mupirocin (Bactroban) as instructed Primary Dressing MediHoney Gel, tube 1.5 (oz) Discharge Instruction: Apply to wound bed as instructed Secondary Dressing ABD Pad, 5x9 Discharge Instruction: Apply over primary dressing as directed. Secured  With Elastic Bandage 4 inch (ACE bandage) Discharge Instruction: Secure with ACE bandage as directed. Compression Wrap Kerlix Roll 4.5x3.1 (in/yd) Discharge Instruction: Apply Kerlix and Coban compression as directed. Compression Stockings Add-Ons Electronic Signature(s) Signed: 05/09/2022 4:53:43 PM By: [name] Entered By: [name] on 05/09/2022 09:53:53 -------------------------------------------------------------------------------- Vitals Details Patient Name: Date of Service: [name], [name] 05/09/2022 10:15 A M Medical Record Number: [id number] Patient Account Number: [id number] Date of Birth/Sex: Treating RN: 03-Jan-1971 (51 y.o. [name] Primary Care  Yates Weisgerber: [name] Other Clinician: Referring Daniil Labarge: Treating Yocheved Depner/Extender: [name] in Treatment: 65 Vital Signs Time Taken: 09:46 Temperature (F): 98.4 Height (in): 67 Pulse (bpm): 77 Respiratory Rate (breaths/min): 18 Blood Pressure (mmHg): 135/73 Reference Range: 80 - 120 mg / dl Electronic Signature(s) Signed: 05/09/2022 4:53:43 PM By: [name] Entered By: [name] on 05/09/2022 09:47:04

## 2022-05-09 NOTE — Progress Notes (Signed)
[name] ([id number]) [id number] Page 1 of 14 Visit Report for 05/09/2022 Chief Complaint Document Details Patient Name: Date of Service: [name] [name] 05/09/2022 10:15 A M Medical Record Number: [id number] Patient Account Number: [id number] Date of Birth/Sex: Treating RN: 1970-10-20 (51 y.o. F) Primary Care Provider: [name] Other Clinician: Referring Provider: Treating Provider/Extender: Elyse Jarvis Weeks in Treatment: 28 Information Obtained from: Patient Chief Complaint the patient is here for evaluation of her bilateral lower extremity sickle cell ulcers 04/17/2021; patient comes in for substantial wounds on the right and left lower leg Electronic Signature(s) Signed: 05/09/2022 10:56:39 AM By: Fredirick Maudlin MD FACS Entered By: Fredirick Maudlin on 05/09/2022 10:56:39 -------------------------------------------------------------------------------- HPI Details Patient Name: Date of Service: [name] [name], [name] 05/09/2022 10:15 A M Medical Record Number: [id number] Patient Account Number: [id number] Date of Birth/Sex: Treating RN: 1971-04-22 (51 y.o. F) Primary Care Provider: [name] Other Clinician: Referring Provider: Treating Provider/Extender: Elyse Jarvis Weeks in Treatment: 66 History of Present Illness Location: medial and lateral ankle region on the right and left medial malleolus Quality: Patient reports experiencing a shooting pain to affected area(s). Severity: Patient states wound(s) are getting worse. Duration: right lower extremity bimalleolar ulcers have been present for approximately 2 years; the rright meedial malleolus ulcer has been there proximally 6 months Timing: Pain in wound is constant (hurts all the time) Context: The wound would happen gradually ssociated Signs and Symptoms: Patient reports having increase discharge. A HPI Description: 51 year old  patient with a history of sickle cell anemia who was last seen by me with ulceration of the right lower extremity above the ankle and was referred to Dr. Leland Johns for a surgical debridement as I was unable to do anything in the office due to excruciating pain. At that stage she was referred from the plastic surgery service to dermatology who treated her for a skin infection with doxycycline and then Levaquin and a local antibiotic ointment. I understand the patient has since developed ulceration on the left ankle both medial and lateral and was now referred back to the wound center as dermatology has finished the management. I do not have any notes from the dermatology department Old notes: 51 year old patient with a history of sickle cell anemia, pain bilateral lower extremities, right lower extremity ulcer and has a history of receiving a skin graft( Theraskin) several months ago. She has been visiting the wound center [facility] and was seen by Dr. Dellia Nims and Dr. Leland Johns. after prolonged conservator therapy between July 2016 and January 2017. She had been seen by the plastic surgeon and taken to the OR for debridement and application of Theraskin. She had 3 applications of Theraskin and was then treated with collagen. Prior to that she had a history of similar problems in 2014 and was treated conservatively. Had a reflux study done for the right lower extremity in August 2016 without reflux or DVT . Past medical history significant for sickle cell disease, anemia, leg ulcers, cholelithiasis,and has never been a smoker. Once the patient was discharged on the wound center she says within 2 or 3 weeks the problems recurred and she has been treating it conservatively. since I saw her 3 weeks ago at [facility] she has been unable to get her dressing material but has completed a course of doxycycline. 6/7/ 2017 -- lower extremity venous duplex reflux evaluation was done No evidence of SVT or DVT in the  RLL. No venous incompetence in the RLL. No further  vascular workup is indicated at this time. She was seen by Dr. [name], on 10/04/2015. She agreed with the plan of taking her to the OR for debridement and application of theraskin and would also take biopsies to rule out pyoderma gangrenosum. ISMERAI, BIN ([id number]) [id number] Page 2 of 14 Follow-up note dated May 31 received and she was status post application of Theraskin to multiple ulcers around the right ankle. Pathology did not show evidence of malignancy or pyoderma gangrenosum. She would continue to see as in the wound clinic for further care and see Dr. Leland Johns as needed. The patient brought the biopsy report and it was consistent with stasis ulcer no evidence of malignancy and the comment was that there was some adjacent neovascularization, fibrosis and patchy perivascular chronic inflammation. 11/15/2015 -- today we applied her first application of Theraskin 11/30/15; TheraSkin #2 12/13/2015 -- she is having a lot of pain locally and is here for possible application of a theraskin today. 01/16/2016 -- the patient has significant pain and has noticed despite in spite of all local care and oral pain medication. It is impossible to debride her in the office. 02/06/2016 -- I do not see any notes from Dr. Iran Planas( the patient has not made a call to the office know as she heard from them) and the only visit to recently was with her PCP Dr. Danella Penton -- I saw her on 01/16/2016 and prescribed 90 tablets of oxycodone 10 mg and did lab work and screening for HIV. the HIV was negative and hemoglobin was 6.3 with a WBC count of 14.9 and hematocrit of 17.8 with platelets of 561. 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 The bimalleolar ulcers to the right lower extremity have been  present for approximately 2 years, the left medial malleolus ulcer has been present approximately 6 months. She has followed with Dr.Thimmappa in the past and has had a total of 3 applications of Theraskin (01/2015, 09/2015, 06/17/16). She has also followed with Dr. Con Memos here in the clinic and has received 2 applications of TheraSkin (11/10/15, 11/30/15). The patient does experience chronic, and is not amenable to debridement. 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 She is not currently on any antibiotic therapy and has not been treated with any recently. 07/17/2016 -- was seen by Dr. Iran Planas of plastic surgery who saw her 2 weeks postop application of Theraskin #3. She had removed her dressing and asked her to apply silver alginate on alternate days and follow-up back with the wound center. Future debridements and application of skin substitute would have to be done in the hospital due to her high risk for anesthesia. READMISSION 04/17/2021 Patient is now a 51 year old woman that we have had in this clinic for a prolonged period of time and 2016-2017 and then again for 2 visits in February 2018. At that point she had wounds on the right lower leg predominantly medial. She had also been seen by plastic surgery Dr. Leland Johns who I believe took her to the OR for operative debridement and application of TheraSkin in 2017. After she left our clinic she was followed for a very prolonged period of time in the wound care center in [facility] who then referred her ultimately to [facility] where she was seen by Dr. Vernona Rieger. Again taken her to the OR for skin grafting which apparently did  not take. She had multiple other attempts at dressings although I have not really looked over all of these notes in great detail. She has not been seen in a wound care center in about a year. She states over the last year in addition to her right lower leg she has developed wounds on the  left lower leg quite extensive. She is using Xeroform to all of these wounds without really any improvement. She also has Medicaid which does not cover wound products. The patient has had vascular work-ups in the past including most recently on 03/28/2021 showing biphasic waveforms on the right triphasic at the PTA and biphasic at the dorsalis pedis on the left. She was unable to tolerate any degree of compression to do ABIs. Unfortunately TBI's were also not done. She had venous reflux studies done in 2017. This did not show any evidence of a DVT or SVT and no venous incompetence was noted in the right leg at the time this was the only side with the wound As noted I did not look all over her old records. She apparently had a course of HBO and [facility] although I am not sure what the indication would have been. In any case she developed seizures and terminated treatment earlier. She is generally much more disabled than when we last saw her in clinic. She can no longer walk pretty much wheelchair-bound because predominantly of pain in the left hip. 04/24/2021; the patient tolerated the wraps we put on. We used Santyl and Hydrofera Blue under compression. I brought her back for a nurse visit for a change in dressing. With Medicaid we will have a hard time getting anything paid for and hence the need for compression. She arrives in clinic with all the wounds looking somewhat better in terms of surface 12/20; circumferential wound on the right from the lateral to the medial. She has open areas on the left medial and left lateral x2 on all of this with the same surface. This does not look completely healthy although she does have some epithelialization. She is not complaining of a lot of pain which is unusual for her sickle ulcers. I have not looked over her extensive records from [facility]. She had recent arterial studies and has a history of venous reflux studies I will need to look these over although I do  not believe she has significant arterial disease 2023 05/22/2021; patient's wound areas measure slightly smaller. Still a lot of drainage coming from the right we have been using Hydrofera Blue and Santyl with some improvement in the wound surfaces. She tells me she will be getting transfused later in the week for her underlying sickle cell anemia I have looked over her recent arterial studies which were done in the fall. This was in November and showed biphasic and triphasic waveforms but she could not tolerate ABIs because of pressure and unfortunately TBI's were not done. She has not had recent venous reflux studies that I can see 1/10; not much change about the same surface area. This has a yellowish surface to it very gritty. We have been using Santyl and Hydrofera Blue for a prolonged period. Culture I did last week showed methicillin sensitive staph aureus "rare". Our intake nurse reports greenish drainage which may be the Hydrofera Blue itself 1/17; wounds are continue to measure smaller although I am not sure about the accuracy here. Especially the areas on the right are covered in what looks to be a nonviable surface although she  does have some epithelialization. Similarly she has areas on the left medial and left lateral ankle area which appear to have a better surface and perhaps are slightly smaller. We have been using Santyl and Hydrofera Blue. She cannot tolerate mechanical debridements She went for her reflux studies which showed significant reflux at the greater saphenous vein at the saphenofemoral junction as well as the greater saphenous vein in the proximal calf on the left she had reflux in the thigh and the common femoral vein and supra vein Fishel vein reflux in the greater saphenous vein. I will have vein and vascular look at this. My thoughts have been that these are likely sickle wounds. I looked through her old records from [facility] wound care center and then when she  graduated to [facility] wound care center where she saw Dr. Zigmund Daniel and Dr. Vernona Rieger. Although I can see she had reflux studies done I do not see that she actually saw a vein and vascular. I went over the fact that she had operative debridements and actual skin grafting that did not take. I do not think these wounds have ever really progressed towards healing 1/31;Substantial wounds on the right ankle area. Hyper granulated very gritty adherent debris on the surface. 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 On the left she has no DVT or SVT deep vein reflux in the common femoral vein. Superficial vein reflux in the greater saphenous vein on although the vein diameter is not really all that large. I do not think there is anything that can be done with these although I am going to send her for consultation to vein and vascular. 2/7; Wound exam; substantial wound area on the right posterior ankle area and areas on the left medial ankle and left lateral ankle. I was able to debride the left medial ankle last week fairly aggressively and it is back this week to a completely nonviable surface She will see vascular surgery this Friday and I would like them to review the venous studies and also any comments on her arterial status. If they do not see an issue here I am going to refer her to plastic surgery for an operative debridement perhaps intraoperative ACell or Integra. Eventually she will require a deep AMIREE, NO ([id number]) [id number] Page 3 of 14 tissue culture again 2/14; substantial wound area on the  right posterior ankle, medial ankle. We have been using silver alginate The patient was seen by vein and vascular she had both venous reflux studies and arterial studies. 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 She had no evidence of superficial venous thrombosis. She did have superficial vein reflux at the saphenofemoral junction and the greater saphenous vein. On the left no evidence of a DVT no evidence of superficial venous throat thrombosis she did have deep vein reflux in the common femoral vein and superficial vein reflux in the greater saphenous vein but these were not felt to be amenable to ablation. In terms of arterial studies she had triphasic and wife  biphasic wave waveforms bilaterally not felt to have a significant arterial issue. I do not get the feeling that they felt that any part of her nonhealing wounds were related to either arterial or venous issues. They did note that she had venous reflux at the right at the [facility] and [facility]. And also on the left there were reflux in the deep system at the common femoral vein and greater saphenous vein in the proximal thigh. Nothing amenable to ablation. 2/20; she is making some decent progress on the right where there is nice skin between the 2 open areas on the right ankle. The surfaces here do not look viable yet there is some surrounding epithelialization. She still has a small area on the left medial ankle area. Hyper-granulated Jody's away always 2/28 patient has an appointment with plastic surgery on 3/8. We will see her back on 3/9. She may have to call us to get the area redressed. We've been using Santyl under silver alginate. We made a nice improvement on the left medial ankle. The larger wounds on the right also looks somewhat better in terms of epithelialization although I think they could benefit from an aggressive debridement if plastic surgery would be  willing to do that. Perhaps placement of Integra or a cell 07/26/2021: She saw Dr. Claudia Desanctis yesterday. He raised the question as to whether or not this might be pyoderma and wanted to wait until that question was answered by dermatology before proceeding with any sort of operative debridement. We have continue to use Santyl under silver alginate with Kerlix and Coban wraps. Overall, her wounds appear to be continuing to contract and epithelialize, with some granulation tissue present. There continues to be some slough on all wound surfaces. 08/09/2021: She has not been able to get an appointment with dermatology because apparently the offices in [place] do not accept Medicaid. She is looking into whether or not she can be seen at the main [facility] dermatology clinic. This is necessary because plastic surgery is concerned that her wounds might represent pyoderma and they did not want to do any procedure until that was clarified. We have been using Santyl under silver alginate with Kerlix and Coban wraps. Today, there was a greater amount of drainage on her dressings with a slight green discoloration and significant odor. Despite this, her wounds continue to contract and epithelialize. There is pale granulation tissue present and actually, on the left medial ankle, the granulation tissue is a bit hypertrophic. 08/16/2021: Last week, I took a culture and this grew back rare methicillin-resistant Staph aureus and rare corynebacterium. The MRSA was sensitive to gentamicin which we began applying topically on an empiric basis. This week, her wounds are a bit smaller and the drainage and odor are less. Her primary care provider is working on assisting the patient with a dermatology evaluation. She has been in silver alginate over the gentamicin that was started last week along with Kerlix and Coban wraps. 08/23/2021: Because she has Medicaid, we have been unable to get her into see any  dermatologist in the [place] to rule out pyoderma gangrenosum, which was a requirement from plastic surgery prior to any sort of debridement and grafting. Despite this, however, all of her wounds continue to get smaller. The wound on her left medial ankle is nearly closed. There is no odor from the wounds, although she still accumulates a modest amount of drainage on her dressings. 08/30/2021: The lateral right ankle wound and the  medial left ankle wound are a bit smaller today. The medial right ankle wound is about the same size. They are less tender. We have still been unable to get her into dermatology. 09/06/2021: All of the wounds are about the same size today. She continues to endorse minimal pain. I communicated with Dr. Claudia Desanctis in plastic surgery regarding our issues getting a dermatology appointment; he was out of town but indicated that he would look into perhaps performing the biopsy in his office and will have his office contact her. 09/14/2021: The patient has an appointment in dermatology, but it is not until October. Her wounds are roughly the same; she continues to have very thick purulent-looking drainage on her dressings. 09/20/2021: The left medial wound is nearly closed and just has a bit of accumulated eschar on the surface. The right medial and lateral ankle wounds are perhaps a little bit smaller. They continue to have a very pale surface with accumulation of thin slough. 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 I did not think [employer] was indicated based on this. She is getting topical mupirocin with Prisma silver collagen. 10/04/2021: The patient was not seen in clinic last week due to childcare coverage issues. In the interim, the left medial leg wound has closed. The right sided leg wounds are smaller. There is more granulation tissue coming through, particularly on the lateral wound. The surface remains somewhat gritty. We have been applying topical  mupirocin and Prisma silver collagen. 10/11/2021: The left medial leg wound remains closed. She does complain of some anesthetic sensation to the area. Both of the right-sided leg wounds are smaller but still have accumulated slough. 10/18/2021: Both right-sided leg wounds are minimally smaller this week. She still continues to accumulate slough and has thick drainage on her dressings. 10/23/2021: Both wounds continue to contract. There is still slough buildup. She has been approved for a keratin-based skin substitute trial product but it will not be available until next week. 10/30/2021: The wounds are about the same to perhaps slightly smaller. There is still continued slough buildup. Unfortunately, the rep for the keratin based product did not show up today and did not answer his phone when called. 11/08/2021: The wounds are little bit smaller today. She continues to have thick drainage but the surfaces are relatively clean with just a little bit of slough accumulation. She reported to me today that she is unable to completely flex her left ankle and on examination it seems this is potentially related to scar tissue from her wounds. We do have the ProgenaMatrix trial product available for her today. 11/15/2021: Both wounds are smaller today. There is some slough accumulation on the surfaces, but the medial wound, in particular looks like it is filling in and is less deep. She did hear from physical therapy and she is going to start working with them on July 11. She is here for her second application of the trial skin substitute, ProgenaMatrix. 11/22/2021: Both wounds continue to contract, the medial more dramatically than the lateral. Both wounds have a layer of slough on the surface, but underneath this, the gritty fibrous tissue has a little bit more of a pink cast to it rather than being as pale as it has been. 11/29/2021: The wounds are roughly the same size this week, perhaps a millimeter or 2 smaller.  The medial wound has filled in and is nearly flush with the surrounding skin surface. She continues to have a lot of  slough accumulation on both surfaces. 12/06/2021: No significant change to her wounds, but she has a new opening on her dorsal foot, just distal to the right lateral ankle wound. The area on her left medial ankle that reopened looks a little bit larger today. She has quite a bit of pain associated with the new wound. 12/12/2021: Her wounds look about the same but the new opening on her right lateral dorsal foot is a little bit bigger. She continues to have a fair amount of pain with this wound. 12/26/2021: The left medial ankle wound is tiny and superficial. She has 2 areas of crusting on her left lateral ankle, however, that appear to be threatening to ADELIS, DOCTER ([id number]) [id number] Page 4 of 14 open again. Her right medial ankle wound is a little bit smaller today but still continues to accumulate thick rubbery slough. The new dorsal foot wound is exquisitely painful but there is no odor or purulent drainage. No erythema or induration. The right lateral ankle wound looks about the same today, again with thick rubbery slough. 01/03/2022: The left medial ankle wound has closed again. Both right ankle wounds appear to be about the same size with thick rubbery slough. The dorsal foot wound on the right continues to be quite painful and she stated that she did not want any debridement of that site today. 01/10/2022: No real change to any of her wounds. She continues to accumulate thick slough. The dorsal foot wound has merged with the lateral malleolar wound. She is experiencing significant pain in the dorsal foot portion of the ulcer. 01/16/2022: Absolutely no change or progress in her wounds. 01/24/2022: Her wounds are unchanged. She continues to build up slough and the wounds on her dorsal right foot are still exquisitely tender. 01/31/2022: The wounds  actually measure a little bit narrower today. They still have thick slough on the surface but the underlying tissue seems a little less fibrotic. We changed to Iodosorb last week. 02/07/2022: Wounds continue to slowly epithelialize around the parameters. She has less pain today. She still accumulates a fairly substantial layer of slough. 02/15/2022: No change to the wounds overall. The measured a little bit larger today per the intake nurse. She continues to have substantial slough accumulation and her pain is a little bit worse. 02/21/2022. No change at all to her wounds. I do not see that plastic surgery has received her referral yet. 02/28/2022: The wounds remain unchanged. They are dry and fibrotic with accumulation of slough and eschar. She did receive an appointment to see plastic surgery on October 23, but the office called her back and indicated they needed to reschedule and that the next available appointment was not until December. The patient became angry and decided she did not want to see this plastics group. 10/19; the patient sees Dr. Lovett Calender again next week. She is using Medihoney as the primary dressing changing this herself. 03/21/2022: She saw Dr. Jaclynn Guarneri at the wound care center at [facility]. Dr. Jaclynn Guarneri was also unable to debride her, secondary to pain and is planning to take her to the operating room for operative debridement and potential skin substitute placement. Her wounds are unchanged with thick yellow slough and a fibrotic base. She says they are too painful to be debrided. In addition, yesterday her hemoglobin was 4 and she received 2 units packed red blood cell transfusion. 04/04/2022: Her operation at [facility] is scheduled for December 15. She continues to use Medihoney on her  wounds. They are a little bit less painful today and she is willing to entertain the possibility of debridement. The wounds measured a little bit larger in all dimensions  today but the layer of slough is not as thick as usual. 04/18/2022: Her wounds actually measured a little bit smaller today and the extension onto the dorsal foot from the lateral wound has healed. They are less tender and there is significantly less slough present as compared to prior visits. 05/09/2022: She had to reschedule her surgery due to lack of childcare. It is now planned for January 5. The wounds are basically unchanged, but she had a lot more drainage, which was thick and somewhat purulent in appearance. No significant odor. 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 They are quite painful today and she does not want to have debridement performed. Electronic Signature(s) Signed: 05/09/2022 10:58:10 AM By: Fredirick Maudlin MD FACS Entered By: Fredirick Maudlin on 05/09/2022 10:58:10 -------------------------------------------------------------------------------- Physical Exam Details Patient Name: Date of Service: [name] [name], [name] 05/09/2022 10:15 A M Medical Record Number: [id number] Patient Account Number: [id number] Date of Birth/Sex: Treating RN: Oct 18, 1970 (51 y.o. F) Primary Care Provider: [name] Other Clinician: Referring Provider: Treating Provider/Extender: Elyse Jarvis Weeks in Treatment: 55 Constitutional . . . . No acute distress. Respiratory Normal work of breathing on room air. Notes 05/09/2022: The wounds are basically unchanged, but she had a lot more drainage, which was thick and somewhat purulent in appearance. No significant odor. Electronic Signature(s) Signed: 05/09/2022 10:59:16 AM By: Fredirick Maudlin MD FACS Entered By: Fredirick Maudlin on 05/09/2022 10:59:16 [name] ([id number]) [id number] Page 5 of 14 -------------------------------------------------------------------------------- Physician Orders Details Patient Name: Date of Service: [name] [name] 05/09/2022 10:15  A M Medical Record Number: [id number] Patient Account Number: [id number] Date of Birth/Sex: Treating RN: Jun 02, 1970 (51 y.o. [name] Primary Care Provider: [name] Other Clinician: Referring Provider: Treating Provider/Extender: Elyse Jarvis Weeks in Treatment: 73 Verbal / Phone Orders: No Diagnosis Coding ICD-10 Coding Code Description L97.818 Non-pressure chronic ulcer of other part of right lower leg with other specified severity 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 Follow-up Appointments Return appointment in 3 weeks. - Dr. Celine Ahr - room 3 Anesthetic (In clinic) Topical Lidocaine 5% applied to wound bed - Used in clinic Bathing/ Shower/ Hygiene May shower with protection but do not get wound dressing(s) wet. - Can get cast protector bags at [facility] or [facility] Edema Control - Lymphedema / SCD / Other Elevate legs to the level of the heart or above for 30 minutes daily and/or when sitting, a frequency of: - throughout the day Avoid standing for long periods of time. Exercise regularly Additional Orders / Instructions Follow Nutritious Diet Wound Treatment Wound #17 - Lower Leg Wound Laterality: Right, Lateral Cleanser: Soap and Water Every Other Day/30 Days Discharge Instructions: May shower and wash wound with dial antibacterial soap and water prior to dressing change. Cleanser: Wound Cleanser Every Other Day/30 Days Discharge Instructions: Cleanse the wound with wound cleanser prior to applying a clean dressing using gauze sponges, not tissue or cotton balls. Peri-Wound Care: Triamcinolone 15 (g) Every Other Day/30 Days Discharge Instructions: Use triamcinolone 15 (g) as directed Peri-Wound Care: Sween Lotion (Moisturizing lotion) Every Other Day/30 Days Discharge Instructions: Apply moisturizing lotion as directed Topical: Mupirocin Ointment Every Other Day/30 Days Discharge Instructions: Apply Mupirocin (Bactroban) as  instructed Prim Dressing: MediHoney Gel, tube 1.5 (oz) Every Other Day/30 Days ary Discharge Instructions: Apply  to wound bed as instructed Secondary Dressing: ABD Pad, 5x9 Every Other Day/30 Days Discharge Instructions: Apply over primary dressing as directed. Secured With: Elastic Bandage 4 inch (ACE bandage) Every Other Day/30 Days Discharge Instructions: Secure with ACE bandage as directed. Compression Wrap: Kerlix Roll 4.5x3.1 (in/yd) Every Other Day/30 Days Discharge Instructions: Apply Kerlix and Coban compression as directed. Wound #21 - Ankle Wound Laterality: Right, Medial Cleanser: Soap and Water Every Other Day/30 Days Discharge Instructions: May shower and wash wound with dial antibacterial soap and water prior to dressing change. Cleanser: Wound Cleanser Every Other Day/30 Days Discharge Instructions: Cleanse the wound with wound cleanser prior to applying a clean dressing using gauze sponges, not tissue or cotton balls. KARELI, HOSSAIN ([id number]) [id number] Page 6 of 14 Peri-Wound Care: Triamcinolone 15 (g) Every Other Day/30 Days Discharge Instructions: Use triamcinolone 15 (g) as directed Peri-Wound Care: Sween Lotion (Moisturizing lotion) Every Other Day/30 Days Discharge Instructions: Apply moisturizing lotion as directed Topical: Mupirocin Ointment Every Other Day/30 Days Discharge Instructions: Apply Mupirocin (Bactroban) as instructed Prim Dressing: MediHoney Gel, tube 1.5 (oz) Every Other Day/30 Days ary Discharge Instructions: Apply to wound bed as instructed Secondary Dressing: ABD Pad, 5x9 Every Other Day/30 Days Discharge Instructions: Apply over primary dressing as directed. Secured With: Elastic Bandage 4 inch (ACE bandage) Every Other Day/30 Days Discharge Instructions: Secure with ACE bandage as directed. Compression Wrap: Kerlix Roll 4.5x3.1 (in/yd) Every Other Day/30 Days Discharge Instructions: Apply Kerlix and Coban  compression as directed. Patient Medications llergies: No Known Allergies A Notifications Medication Indication Start End 05/09/2022 lidocaine DOSE topical 5 % ointment - ointment topical 05/09/2022 mupirocin DOSE topical 2 % ointment - Apply along with Medihoney to wounds with dressing changes Electronic Signature(s) Signed: 05/09/2022 11:00:57 AM By: Fredirick Maudlin MD FACS Previous Signature: 05/09/2022 10:56:07 AM Version By: Fredirick Maudlin MD FACS Entered By: Fredirick Maudlin on 05/09/2022 11:00:55 -------------------------------------------------------------------------------- Problem List Details Patient Name: Date of Service: [name] [name], [name] 05/09/2022 10:15 A M Medical Record Number: [id number] Patient Account Number: [id number] Date of Birth/Sex: Treating RN: Feb 03, 1971 (51 y.o. F) Primary Care Provider: [name] Other Clinician: Referring Provider: Treating Provider/Extender: Elyse Jarvis Weeks in Treatment: 29 Active Problems ICD-10 Encounter Code Description Active Date MDM Diagnosis L97.818 Non-pressure chronic ulcer of other part of right lower leg with other specified 04/17/2021 No Yes severity 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 04/17/2021 No Yes Inactive Problems ICD-10 Code Description Active Date Inactive Date L97.828 Non-pressure chronic ulcer of other part of left lower leg with other specified severity 04/17/2021 04/17/2021 [name] ([id number]) [id number] Page 7 of 14 Resolved Problems Electronic Signature(s) Signed: 05/09/2022 10:56:22 AM By: Fredirick Maudlin MD FACS Entered By: Fredirick Maudlin on 05/09/2022 10:56:22 -------------------------------------------------------------------------------- Progress Note Details Patient Name: Date of Service: [name] [name], [name] 05/09/2022 10:15 A M Medical Record Number: [id number] Patient Account Number: [id number] Date of Birth/Sex:  Treating RN: 1971-05-01 (51 y.o. F) Primary Care Provider: [name] Other Clinician: Referring Provider: Treating Provider/Extender: Elyse Jarvis Weeks in Treatment: 58 Subjective Chief Complaint Information obtained from Patient the patient is here for evaluation of her bilateral lower extremity sickle cell ulcers 04/17/2021; patient comes in for substantial wounds on the right and left lower leg History of Present Illness (HPI) The following HPI elements were documented for the patient's wound: Location: medial and lateral ankle region on the right and left medial malleolus Quality: Patient reports experiencing a shooting pain to affected area(s). Severity: Patient states wound(s)  are getting worse. Duration: right lower extremity bimalleolar ulcers have been present for approximately 2 years; the rright meedial malleolus ulcer has been there proximally 6 months Timing: Pain in wound is constant (hurts all the time) Context: The wound would happen gradually Associated Signs and Symptoms: Patient reports having increase discharge. 51 year old patient with a history of sickle cell anemia who was last seen by me with ulceration of the right lower extremity above the ankle and was referred to Dr. Leland Johns for a surgical debridement as I was unable to do anything in the office due to excruciating pain. At that stage she was referred from the plastic surgery service to dermatology who treated her for a skin infection with doxycycline and then Levaquin and a local antibiotic ointment. I understand the patient has since developed ulceration on the left ankle both medial and lateral and was now referred back to the wound center as dermatology has finished the management. I do not have any notes from the dermatology department Old notes: 51 year old patient with a history of sickle cell anemia, pain bilateral lower extremities, right lower extremity ulcer and has a  history of receiving a skin graft( Theraskin) several months ago. She has been visiting the wound center [facility] and was seen by Dr. Dellia Nims and Dr. Leland Johns. after prolonged conservator therapy between July 2016 and January 2017. She had been seen by the plastic surgeon and taken to the OR for debridement and application of Theraskin. She had 3 applications of Theraskin and was then treated with collagen. Prior to that she had a history of similar problems in 2014 and was treated conservatively. Had a reflux study done for the right lower extremity in August 2016 without reflux or DVT . Past medical history significant for sickle cell disease, anemia, leg ulcers, cholelithiasis,and has never been a smoker. Once the patient was discharged on the wound center she says within 2 or 3 weeks the problems recurred and she has been treating it conservatively. since I saw her 3 weeks ago at [facility] she has been unable to get her dressing material but has completed a course of doxycycline. 6/7/ 2017 -- lower extremity venous duplex reflux evaluation was done oo No evidence of SVT or DVT in the RLL. No venous incompetence in the RLL. No further vascular workup is indicated at this time. She was seen by Dr. [name], on 10/04/2015. She agreed with the plan of taking her to the OR for debridement and application of theraskin and would also take biopsies to rule out pyoderma gangrenosum. Follow-up note dated May 31 received and she was status post application of Theraskin to multiple ulcers around the right ankle. Pathology did not show evidence of malignancy or pyoderma gangrenosum. She would continue to see as in the wound clinic for further care and see Dr. Leland Johns as needed. The patient brought the biopsy report and it was consistent with stasis ulcer no evidence of malignancy and the comment was that there was some adjacent neovascularization, fibrosis and patchy perivascular chronic  inflammation. 11/15/2015 -- today we applied her first application of Theraskin 11/30/15; TheraSkin #2 12/13/2015 -- she is having a lot of pain locally and is here for possible application of a theraskin today. 01/16/2016 -- the patient has significant pain and has noticed despite in spite of all local care and oral pain medication. It is impossible to debride her in the office. 02/06/2016 -- I do not see any notes from Dr. Iran Planas( the patient has not  made a call to the office know as she heard from them) and the only visit to recently was with her PCP Dr. Danella Penton -- I saw her on 01/16/2016 and prescribed 90 tablets of oxycodone 10 mg and did lab work and screening for HIV. the HIV was negative and hemoglobin was 6.3 with a WBC count of 14.9 and hematocrit of 17.8 with platelets of 561. 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 The bimalleolar ulcers to the right lower extremity have been present for approximately 2 years, the left medial malleolus ulcer has been present approximately 6 months. She has followed with Dr.Thimmappa in the past and has had a total of 3 applications of Theraskin (01/2015, 09/2015, 06/17/16). She has also followed with Dr. Con Memos here in the clinic and has received 2 applications of TheraSkin (11/10/15, 11/30/15). The patient does experience chronic, and is not amenable to debridement. She had a sickle cell BELKY, MUNDO ([id number]) [id number] Page 8 of 14 crisis in December 2017, prior to that has been several years. She is not currently on any antibiotic therapy and has not been treated with any recently. 07/17/2016 -- was seen by Dr. Iran Planas of plastic surgery who saw her 2 weeks postop application of Theraskin #3. She had removed her dressing and asked her to apply silver alginate on alternate days and follow-up back  with the wound center. Future debridements and application of skin substitute would have to be done in the hospital due to her high risk for anesthesia. READMISSION 04/17/2021 Patient is now a 51 year old woman that we have had in this clinic for a prolonged period of time and 2016-2017 and then again for 2 visits in February 2018. At that point she had wounds on the right lower leg predominantly medial. She had also been seen by plastic surgery Dr. Leland Johns who I believe took her to the OR for operative debridement and application of TheraSkin in 2017. After she left our clinic she was followed for a very prolonged period of time in the wound care center in [facility] who then referred her ultimately to [facility] where she was seen by Dr. Vernona Rieger. Again taken her to the OR for skin grafting which apparently did not take. She had multiple other attempts at dressings although I have not really looked over all of these notes in great detail. She has not been seen in a wound care center in about a year. She states over the last year in addition to her right lower leg she has developed wounds on the left lower leg quite extensive. She is using Xeroform to all of these wounds without really any improvement. She also has Medicaid which does not cover wound products. The patient has had vascular work-ups in the past including most recently on 03/28/2021 showing biphasic waveforms on the right triphasic at the PTA and biphasic at the dorsalis pedis on the left. She was unable to tolerate any degree of compression to do ABIs. Unfortunately TBI's were also not done. She had venous reflux studies done in 2017. This did not show any evidence of a DVT or SVT and no venous incompetence was noted in the right leg at the time this was the only side with the wound As noted I did not look all over her old records. She apparently had a course of HBO and [facility] although I am not sure what the indication would  have been.  In any case she developed seizures and terminated treatment earlier. She is generally much more disabled than when we last saw her in clinic. She can no longer walk pretty much wheelchair-bound because predominantly of pain in the left hip. 04/24/2021; the patient tolerated the wraps we put on. We used Santyl and Hydrofera Blue under compression. I brought her back for a nurse visit for a change in dressing. With Medicaid we will have a hard time getting anything paid for and hence the need for compression. She arrives in clinic with all the wounds looking somewhat better in terms of surface 12/20; circumferential wound on the right from the lateral to the medial. She has open areas on the left medial and left lateral x2 on all of this with the same surface. This does not look completely healthy although she does have some epithelialization. She is not complaining of a lot of pain which is unusual for her sickle ulcers. I have not looked over her extensive records from [facility]. She had recent arterial studies and has a history of venous reflux studies I will need to look these over although I do not believe she has significant arterial disease 2023 05/22/2021; patient's wound areas measure slightly smaller. Still a lot of drainage coming from the right we have been using Hydrofera Blue and Santyl with some improvement in the wound surfaces. She tells me she will be getting transfused later in the week for her underlying sickle cell anemia I have looked over her recent arterial studies which were done in the fall. This was in November and showed biphasic and triphasic waveforms but she could not tolerate ABIs because of pressure and unfortunately TBI's were not done. She has not had recent venous reflux studies that I can see 1/10; not much change about the same surface area. This has a yellowish surface to it very gritty. We have been using Santyl and Hydrofera Blue for a prolonged period. Culture I  did last week showed methicillin sensitive staph aureus "rare". Our intake nurse reports greenish drainage which may be the Hydrofera Blue itself 1/17; wounds are continue to measure smaller although I am not sure about the accuracy here. Especially the areas on the right are covered in what looks to be a nonviable surface although she does have some epithelialization. Similarly she has areas on the left medial and left lateral ankle area which appear to have a better surface and perhaps are slightly smaller. We have been using Santyl and Hydrofera Blue. She cannot tolerate mechanical debridements She went for her reflux studies which showed significant reflux at the greater saphenous vein at the saphenofemoral junction as well as the greater saphenous vein in the proximal calf on the left she had reflux in the thigh and the common femoral vein and supra vein Fishel vein reflux in the greater saphenous vein. I will have vein and vascular look at this. My thoughts have been that these are likely sickle wounds. I looked through her old records from [facility] wound care center and then when she graduated to [facility] wound care center where she saw Dr. Zigmund Daniel and Dr. Vernona Rieger. Although I can see she had reflux studies done I do not see that she actually saw a vein and vascular. I went over the fact that she had operative debridements and actual skin grafting that did not take. I do not think these wounds have ever really progressed towards healing 1/31;Substantial wounds on the right  ankle area. Hyper granulated very gritty adherent debris on the surface. 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 On the left she has no DVT or SVT deep vein reflux in the common femoral vein. Superficial vein reflux in the greater saphenous vein on although the vein diameter is not really all that large. I do not think there is anything that can be done with these although I am going to send her for consultation to vein and vascular. 2/7; Wound exam; substantial wound area on the right posterior ankle area and areas on the left medial ankle and left lateral ankle. I was able to debride the left medial ankle last week fairly aggressively and it is back this week to a completely nonviable surface She will see vascular surgery this Friday and I would like them to review the venous studies and also any comments on her arterial status. If they do not see an issue here I am going to refer her to plastic surgery for an operative debridement perhaps intraoperative ACell or Integra. Eventually she will require a deep tissue culture again 2/14; substantial wound area on the right posterior ankle, medial ankle. We have been using silver alginate The patient was seen by vein and vascular she had both venous reflux studies and arterial studies. 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 She had no evidence of superficial venous thrombosis. She did have superficial vein reflux at the saphenofemoral junction and the greater saphenous vein. On the left no evidence of a DVT no evidence of superficial venous throat thrombosis she did have deep vein reflux in the common femoral vein and superficial vein reflux in the greater saphenous vein but these were not felt to be amenable to ablation. In terms of arterial studies she had triphasic and wife biphasic wave waveforms bilaterally not felt to have a significant arterial issue. I do not get the feeling that they felt that any part of her nonhealing  wounds were related to either arterial or venous issues. They did note that she had venous reflux at the right at the [facility] and [facility]. And also on the left there were reflux in the deep system at the common femoral vein and greater saphenous vein in the proximal thigh. Nothing amenable to ablation. 2/20; she is making some decent progress on the right where there is nice skin between the 2 open areas on the right ankle. The surfaces here do not look viable yet there is some surrounding epithelialization. She still has a small area on the left medial ankle area. Hyper-granulated Jody's away always 2/28 patient has an appointment with plastic surgery on 3/8. We will see her back on 3/9. She may have to call us to get the area redressed. We've been using Santyl under silver alginate. We made a nice improvement on the left medial ankle. The larger wounds on the right also looks somewhat LINDZY, RUPERT ([id number]) [id number] Page 9 of 14 better in terms of epithelialization although I think they could benefit from an aggressive debridement if  plastic surgery would be willing to do that. Perhaps placement of Integra or a cell 07/26/2021: She saw Dr. Claudia Desanctis yesterday. He raised the question as to whether or not this might be pyoderma and wanted to wait until that question was answered by dermatology before proceeding with any sort of operative debridement. We have continue to use Santyl under silver alginate with Kerlix and Coban wraps. Overall, her wounds appear to be continuing to contract and epithelialize, with some granulation tissue present. There continues to be some slough on all wound surfaces. 08/09/2021: She has not been able to get an appointment with dermatology because apparently the offices in [place] do not accept Medicaid. She is looking into whether or not she can be seen at the main [facility] dermatology clinic. This is necessary because  plastic surgery is concerned that her wounds might represent pyoderma and they did not want to do any procedure until that was clarified. We have been using Santyl under silver alginate with Kerlix and Coban wraps. Today, there was a greater amount of drainage on her dressings with a slight green discoloration and significant odor. Despite this, her wounds continue to contract and epithelialize. There is pale granulation tissue present and actually, on the left medial ankle, the granulation tissue is a bit hypertrophic. 08/16/2021: Last week, I took a culture and this grew back rare methicillin-resistant Staph aureus and rare corynebacterium. The MRSA was sensitive to gentamicin which we began applying topically on an empiric basis. This week, her wounds are a bit smaller and the drainage and odor are less. Her primary care provider is working on assisting the patient with a dermatology evaluation. She has been in silver alginate over the gentamicin that was started last week along with Kerlix and Coban wraps. 08/23/2021: Because she has Medicaid, we have been unable to get her into see any dermatologist in the [place] to rule out pyoderma gangrenosum, which was a requirement from plastic surgery prior to any sort of debridement and grafting. Despite this, however, all of her wounds continue to get smaller. The wound on her left medial ankle is nearly closed. There is no odor from the wounds, although she still accumulates a modest amount of drainage on her dressings. 08/30/2021: The lateral right ankle wound and the medial left ankle wound are a bit smaller today. The medial right ankle wound is about the same size. They are less tender. We have still been unable to get her into dermatology. 09/06/2021: All of the wounds are about the same size today. She continues to endorse minimal pain. I communicated with Dr. Claudia Desanctis in plastic surgery regarding our issues getting a dermatology appointment; he was out of  town but indicated that he would look into perhaps performing the biopsy in his office and will have his office contact her. 09/14/2021: The patient has an appointment in dermatology, but it is not until October. Her wounds are roughly the same; she continues to have very thick purulent-looking drainage on her dressings. 09/20/2021: The left medial wound is nearly closed and just has a bit of accumulated eschar on the surface. The right medial and lateral ankle wounds are perhaps a little bit smaller. They continue to have a very pale surface with accumulation of thin slough. 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 I did not think [employer] was indicated based on this. She is getting topical mupirocin with Prisma silver collagen. 10/04/2021: The patient was not seen  in clinic last week due to childcare coverage issues. In the interim, the left medial leg wound has closed. The right sided leg wounds are smaller. There is more granulation tissue coming through, particularly on the lateral wound. The surface remains somewhat gritty. We have been applying topical mupirocin and Prisma silver collagen. 10/11/2021: The left medial leg wound remains closed. She does complain of some anesthetic sensation to the area. Both of the right-sided leg wounds are smaller but still have accumulated slough. 10/18/2021: Both right-sided leg wounds are minimally smaller this week. She still continues to accumulate slough and has thick drainage on her dressings. 10/23/2021: Both wounds continue to contract. There is still slough buildup. She has been approved for a keratin-based skin substitute trial product but it will not be available until next week. 10/30/2021: The wounds are about the same to perhaps slightly smaller. There is still continued slough buildup. Unfortunately, the rep for the keratin based product did not show up today and did not answer his phone when called. 11/08/2021: The wounds are  little bit smaller today. She continues to have thick drainage but the surfaces are relatively clean with just a little bit of slough accumulation. She reported to me today that she is unable to completely flex her left ankle and on examination it seems this is potentially related to scar tissue from her wounds. We do have the ProgenaMatrix trial product available for her today. 11/15/2021: Both wounds are smaller today. There is some slough accumulation on the surfaces, but the medial wound, in particular looks like it is filling in and is less deep. She did hear from physical therapy and she is going to start working with them on July 11. She is here for her second application of the trial skin substitute, ProgenaMatrix. 11/22/2021: Both wounds continue to contract, the medial more dramatically than the lateral. Both wounds have a layer of slough on the surface, but underneath this, the gritty fibrous tissue has a little bit more of a pink cast to it rather than being as pale as it has been. 11/29/2021: The wounds are roughly the same size this week, perhaps a millimeter or 2 smaller. The medial wound has filled in and is nearly flush with the surrounding skin surface. She continues to have a lot of slough accumulation on both surfaces. 12/06/2021: No significant change to her wounds, but she has a new opening on her dorsal foot, just distal to the right lateral ankle wound. The area on her left medial ankle that reopened looks a little bit larger today. She has quite a bit of pain associated with the new wound. 12/12/2021: Her wounds look about the same but the new opening on her right lateral dorsal foot is a little bit bigger. She continues to have a fair amount of pain with this wound. 12/26/2021: The left medial ankle wound is tiny and superficial. She has 2 areas of crusting on her left lateral ankle, however, that appear to be threatening to open again. Her right medial ankle wound is a little bit  smaller today but still continues to accumulate thick rubbery slough. The new dorsal foot wound is exquisitely painful but there is no odor or purulent drainage. No erythema or induration. The right lateral ankle wound looks about the same today, again with thick rubbery slough. 01/03/2022: The left medial ankle wound has closed again. Both right ankle wounds appear to be about the same size with thick rubbery slough. The dorsal foot wound on  the right continues to be quite painful and she stated that she did not want any debridement of that site today. 01/10/2022: No real change to any of her wounds. She continues to accumulate thick slough. The dorsal foot wound has merged with the lateral malleolar wound. She is experiencing significant pain in the dorsal foot portion of the ulcer. 01/16/2022: Absolutely no change or progress in her wounds. 01/24/2022: Her wounds are unchanged. She continues to build up slough and the wounds on her dorsal right foot are still exquisitely tender. 01/31/2022: The wounds actually measure a little bit narrower today. They still have thick slough on the surface but the underlying tissue seems a little less fibrotic. We changed to Iodosorb last week. 02/07/2022: Wounds continue to slowly epithelialize around the parameters. She has less pain today. She still accumulates a fairly substantial layer of slough. PARISHA, BEAULAC ([id number]) [id number] Page 10 of 14 02/15/2022: No change to the wounds overall. The measured a little bit larger today per the intake nurse. She continues to have substantial slough accumulation and her pain is a little bit worse. 02/21/2022. No change at all to her wounds. I do not see that plastic surgery has received her referral yet. 02/28/2022: The wounds remain unchanged. They are dry and fibrotic with accumulation of slough and eschar. She did receive an appointment to see plastic surgery on October 23, but the office  called her back and indicated they needed to reschedule and that the next available appointment was not until December. The patient became angry and decided she did not want to see this plastics group. 10/19; the patient sees Dr. Lovett Calender again next week. She is using Medihoney as the primary dressing changing this herself. 03/21/2022: She saw Dr. Jaclynn Guarneri at the wound care center at [facility]. Dr. Jaclynn Guarneri was also unable to debride her, secondary to pain and is planning to take her to the operating room for operative debridement and potential skin substitute placement. Her wounds are unchanged with thick yellow slough and a fibrotic base. She says they are too painful to be debrided. In addition, yesterday her hemoglobin was 4 and she received 2 units packed red blood cell transfusion. 04/04/2022: Her operation at [facility] is scheduled for December 15. She continues to use Medihoney on her wounds. They are a little bit less painful today and she is willing to entertain the possibility of debridement. The wounds measured a little bit larger in all dimensions today but the layer of slough is not as thick as usual. 04/18/2022: Her wounds actually measured a little bit smaller today and the extension onto the dorsal foot from the lateral wound has healed. They are less tender and there is significantly less slough present as compared to prior visits. 05/09/2022: She had to reschedule her surgery due to lack of childcare. It is now planned for January 5. The wounds are basically unchanged, but she had a lot more drainage, which was thick and somewhat purulent in appearance. No significant odor. 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 They are quite painful today and she does not want to have debridement performed. Patient History Information obtained from Patient. Family History Diabetes - Mother, Lung Disease - Mother, No family history of Cancer, Heart Disease,  Hereditary Spherocytosis, Hypertension, Kidney Disease, Seizures, Stroke, Thyroid Problems, Tuberculosis. Social History Never smoker, Marital Status - Married, Alcohol Use - Never, Drug Use - No History, Caffeine Use - Daily. Medical History Eyes Denies history of  Cataracts, Glaucoma, Optic Neuritis Ear/Nose/Mouth/Throat Denies history of Chronic sinus problems/congestion, Middle ear problems Hematologic/Lymphatic Patient has history of Anemia, Sickle Cell Disease Denies history of Hemophilia, Human Immunodeficiency Virus, Lymphedema Respiratory Denies history of Aspiration, Asthma, Chronic Obstructive Pulmonary Disease (COPD), Pneumothorax, Sleep Apnea, Tuberculosis Cardiovascular Denies history of Angina, Arrhythmia, Congestive Heart Failure, Coronary Artery Disease, Deep Vein Thrombosis, Hypertension, Hypotension, Myocardial Infarction, Peripheral Arterial Disease, Peripheral Venous Disease, Phlebitis, Vasculitis Gastrointestinal Denies history of Cirrhosis , Colitis, Crohnoos, Hepatitis A, Hepatitis B, Hepatitis C Endocrine Denies history of Type I Diabetes, Type II Diabetes Genitourinary Denies history of End Stage Renal Disease Immunological Denies history of Lupus Erythematosus, Raynaudoos, Scleroderma Integumentary (Skin) Denies history of History of Burn Musculoskeletal Denies history of Gout, Rheumatoid Arthritis, Osteoarthritis, Osteomyelitis Neurologic Patient has history of Neuropathy - right foot intermittant Denies history of Dementia, Quadriplegia, Paraplegia, Seizure Disorder Oncologic Denies history of Received Chemotherapy, Received Radiation Psychiatric Denies history of Anorexia/bulimia, Confinement Anxiety Hospitalization/Surgery History - c section x2. - left breast lumpectomy. - iandD right ankle with theraskin. Medical A Surgical History Notes nd Constitutional Symptoms (General Health) H/O  miscarriage Cardiovascular bradycardia Gastrointestinal cholilithiasis YAMILKA, LOPICCOLO ([id number]) [id number] Page 11 of 14 Objective Constitutional No acute distress. Vitals Time Taken: 9:46 AM, Height: 67 in, Temperature: 98.4 F, Pulse: 77 bpm, Respiratory Rate: 18 breaths/min, Blood Pressure: 135/73 mmHg. Respiratory Normal work of breathing on room air. General Notes: 05/09/2022: The wounds are basically unchanged, but she had a lot more drainage, which was thick and somewhat purulent in appearance. No significant odor. Integumentary (Hair, Skin) Wound #17 status is Open. Original cause of wound was Gradually Appeared. The date acquired was: 10/05/2012. The wound has been in treatment 55 weeks. The wound is located on the Right,Lateral Lower Leg. The wound measures 6.6cm length x 5.1cm width x 0.2cm depth; 26.437cm^2 area and 5.287cm^3 volume. There is Fat Layer (Subcutaneous Tissue) exposed. There is no tunneling or undermining noted. There is a medium amount of serosanguineous drainage noted. The wound margin is distinct with the outline attached to the wound base. There is small (1-33%) pink, pale granulation within the wound bed. There is a large (67-100%) amount of necrotic tissue within the wound bed including Adherent Slough. The periwound skin appearance had no abnormalities noted for moisture. The periwound skin appearance had no abnormalities noted for color. The periwound skin appearance exhibited: Scarring. Periwound temperature was noted as No Abnormality. Wound #21 status is Open. Original cause of wound was Gradually Appeared. The date acquired was: 06/26/2021. The wound has been in treatment 45 weeks. The wound is located on the Right,Medial Ankle. The wound measures 7.8cm length x 3.8cm width x 0.1cm depth; 23.279cm^2 area and 2.328cm^3 volume. There is Fat Layer (Subcutaneous Tissue) exposed. There is no tunneling or undermining noted. There  is a medium amount of serosanguineous drainage noted. The wound margin is distinct with the outline attached to the wound base. There is small (1-33%) pink granulation within the wound bed. There is a large (67- 100%) amount of necrotic tissue within the wound bed including Adherent Slough. The periwound skin appearance had no abnormalities noted for moisture. The periwound skin appearance had no abnormalities noted for color. The periwound skin appearance exhibited: Scarring. Periwound temperature was noted as No Abnormality. 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 - Dr. Celine Ahr - room 3 Anesthetic: (In clinic) Topical  Lidocaine 5% applied to wound bed - Used in clinic Bathing/ Shower/ Hygiene: May shower with protection but do not get wound dressing(s) wet. - Can get cast protector bags at [facility] or [facility] Edema Control - Lymphedema / SCD / Other: Elevate legs to the level of the heart or above for 30 minutes daily and/or when sitting, a frequency of: - throughout the day Avoid standing for long periods of time. Exercise regularly Additional Orders / Instructions: Follow Nutritious Diet The following medication(s) was prescribed: lidocaine topical 5 % ointment ointment topical was prescribed at facility mupirocin topical 2 % ointment Apply along with Medihoney to wounds with dressing changes starting 05/09/2022 WOUND #17: - Lower Leg Wound Laterality: Right, Lateral Cleanser: Soap and Water Every Other Day/30 Days Discharge Instructions: May shower and wash wound with dial antibacterial soap and water prior to dressing change. Cleanser: Wound Cleanser Every Other Day/30 Days Discharge Instructions: Cleanse the wound with wound cleanser prior to applying a clean dressing using gauze sponges, not tissue or cotton  balls. Peri-Wound Care: Triamcinolone 15 (g) Every Other Day/30 Days Discharge Instructions: Use triamcinolone 15 (g) as directed Peri-Wound Care: Sween Lotion (Moisturizing lotion) Every Other Day/30 Days Discharge Instructions: Apply moisturizing lotion as directed Topical: Mupirocin Ointment Every Other Day/30 Days Discharge Instructions: Apply Mupirocin (Bactroban) as instructed Prim Dressing: MediHoney Gel, tube 1.5 (oz) Every Other Day/30 Days ary Discharge Instructions: Apply to wound bed as instructed Secondary Dressing: ABD Pad, 5x9 Every Other Day/30 Days Discharge Instructions: Apply over primary dressing as directed. Secured With: Elastic Bandage 4 inch (ACE bandage) Every Other Day/30 Days Discharge Instructions: Secure with ACE bandage as directed. Com pression Wrap: Kerlix Roll 4.5x3.1 (in/yd) Every Other Day/30 Days Discharge Instructions: Apply Kerlix and Coban compression as directed. WOUND #21: - Ankle Wound Laterality: Right, Medial Cleanser: Soap and Water Every Other Day/30 Days Discharge Instructions: May shower and wash wound with dial antibacterial soap and water prior to dressing change. Cleanser: Wound Cleanser Every Other Day/30 Days ELLAINA, SCHULER ([id number]) [id number] Page 12 of 14 Discharge Instructions: Cleanse the wound with wound cleanser prior to applying a clean dressing using gauze sponges, not tissue or cotton balls. Peri-Wound Care: Triamcinolone 15 (g) Every Other Day/30 Days Discharge Instructions: Use triamcinolone 15 (g) as directed Peri-Wound Care: Sween Lotion (Moisturizing lotion) Every Other Day/30 Days Discharge Instructions: Apply moisturizing lotion as directed Topical: Mupirocin Ointment Every Other Day/30 Days Discharge Instructions: Apply Mupirocin (Bactroban) as instructed Prim Dressing: MediHoney Gel, tube 1.5 (oz) Every Other Day/30 Days ary Discharge Instructions: Apply to wound bed as  instructed Secondary Dressing: ABD Pad, 5x9 Every Other Day/30 Days Discharge Instructions: Apply over primary dressing as directed. Secured With: Elastic Bandage 4 inch (ACE bandage) Every Other Day/30 Days Discharge Instructions: Secure with ACE bandage as directed. Com pression Wrap: Kerlix Roll 4.5x3.1 (in/yd) Every Other Day/30 Days Discharge Instructions: Apply Kerlix and Coban compression as directed. 05/09/2022: The wounds are basically unchanged, but she had a lot more drainage, which was thick and somewhat purulent in appearance. No significant odor. She would not permit debridement today secondary to pain. Due to the increased pain and the character of the drainage, I am going to add mupirocin back to her dressing regimen, as she is repeatedly cultured MRSA in the past. Continue Medihoney. She will have her surgery on January 5 and we will see her for ongoing postsurgical debridement follow-up once she is released from her surgeon. Electronic Signature(s) Signed: 05/09/2022 11:02:17 AM By: Fredirick Maudlin MD  FACS Entered By: Fredirick Maudlin on 05/09/2022 11:02:17 -------------------------------------------------------------------------------- HxROS Details Patient Name: Date of Service: [name] 05/09/2022 10:15 A M Medical Record Number: [id number] Patient Account Number: [id number] Date of Birth/Sex: Treating RN: Dec 24, 1970 (51 y.o. F) Primary Care Provider: [name] Other Clinician: Referring Provider: Treating Provider/Extender: Elyse Jarvis Weeks in Treatment: 37 Information Obtained From Patient Constitutional Symptoms (General Health) Medical History: Past Medical History Notes: H/O miscarriage Eyes Medical History: Negative for: Cataracts; Glaucoma; Optic Neuritis Ear/Nose/Mouth/Throat Medical History: Negative for: Chronic sinus problems/congestion; Middle ear problems Hematologic/Lymphatic Medical History: Positive for:  Anemia; Sickle Cell Disease Negative for: Hemophilia; Human Immunodeficiency Virus; Lymphedema Respiratory Medical History: Negative for: Aspiration; Asthma; Chronic Obstructive Pulmonary Disease (COPD); Pneumothorax; Sleep Apnea; Tuberculosis Cardiovascular GLYNIS, HUNSUCKER ([id number]) [id number] Page 13 of 14 Medical History: Negative for: Angina; Arrhythmia; Congestive Heart Failure; Coronary Artery Disease; Deep Vein Thrombosis; Hypertension; Hypotension; Myocardial Infarction; Peripheral Arterial Disease; Peripheral Venous Disease; Phlebitis; Vasculitis Past Medical History Notes: bradycardia Gastrointestinal Medical History: Negative for: Cirrhosis ; Colitis; Crohns; Hepatitis A; Hepatitis B; Hepatitis C Past Medical History Notes: cholilithiasis Endocrine Medical History: Negative for: Type I Diabetes; Type II Diabetes Genitourinary Medical History: Negative for: End Stage Renal Disease Immunological Medical History: Negative for: Lupus Erythematosus; Raynauds; Scleroderma Integumentary (Skin) Medical History: Negative for: History of Burn Musculoskeletal Medical History: Negative for: Gout; Rheumatoid Arthritis; Osteoarthritis; Osteomyelitis Neurologic Medical History: Positive for: Neuropathy - right foot intermittant Negative for: Dementia; Quadriplegia; Paraplegia; Seizure Disorder Oncologic Medical History: Negative for: Received Chemotherapy; Received Radiation Psychiatric Medical History: Negative for: Anorexia/bulimia; Confinement Anxiety Immunizations Pneumococcal Vaccine: Received Pneumococcal Vaccination: No Implantable Devices None Hospitalization / Surgery History Type of Hospitalization/Surgery c section x2 left breast lumpectomy iandD right ankle with theraskin Family and Social History Cancer: No; Diabetes: Yes - Mother; Heart Disease: No; Hereditary Spherocytosis: No; Hypertension: No; Kidney Disease: No; Lung  Disease: Yes - Mother; Seizures: No; Stroke: No; Thyroid Problems: No; Tuberculosis: No; Never smoker; Marital Status - Married; Alcohol Use: Never; Drug Use: No History; Caffeine Use: Daily; Financial Concerns: No; Food, Clothing or Shelter Needs: No; Support System Lacking: No; Transportation Concerns: No Electronic Signature(s) Signed: 05/09/2022 12:30:32 PM By: Fredirick Maudlin MD FACS Entered By: Fredirick Maudlin on 05/09/2022 10:58:39 [name] ([id number]) [id number] Page 14 of 14 -------------------------------------------------------------------------------- SuperBill Details Patient Name: Date of Service: [name] [name] 05/09/2022 Medical Record Number: [id number] Patient Account Number: [id number] Date of Birth/Sex: Treating RN: 1971-01-31 (51 y.o. 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 Facility Procedures : CPT4 Code: [id number] Description: 99213 - WOUND CARE VISIT-LEV 3 EST PT Modifier: Quantity: 1 Physician Procedures : CPT4 Code Description Modifier [id number] - WC PHYS LEVEL 4 - EST PT ICD-10 Diagnosis Description L97.818 Non-pressure chronic ulcer of other part of right lower leg with other specified severity 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

## 2022-05-22 ENCOUNTER — Telehealth: Payer: Self-pay | Admitting: Family Medicine

## 2022-05-22 NOTE — Telephone Encounter (Signed)
Caller & Relationship to patient:  MRN #  [id number]   Call Back Number:   Date of Last Office Visit: 05/01/2022     Date of Next Office Visit: 06/18/2022    Medication(s) to be Refilled: Oxycodone , Ibuprofen   Preferred Pharmacy:   ** Please notify patient to allow 48-72 hours to process** **Let patient know to contact pharmacy at the end of the day to make sure medication is ready. ** **If patient has not been seen in a year or longer, book an appointment **Advise to use MyChart for refill requests OR to contact their pharmacy

## 2022-05-23 ENCOUNTER — Other Ambulatory Visit: Payer: Self-pay | Admitting: Family Medicine

## 2022-05-23 DIAGNOSIS — G894 Chronic pain syndrome: Secondary | ICD-10-CM

## 2022-05-23 DIAGNOSIS — D571 Sickle-cell disease without crisis: Secondary | ICD-10-CM

## 2022-05-23 MED ORDER — OXYCODONE HCL 10 MG PO TABS: 10.0000 mg | ORAL_TABLET | ORAL | 0 refills | Status: DC

## 2022-05-23 MED ORDER — IBUPROFEN 600 MG PO TABS: 600.0000 mg | ORAL_TABLET | Freq: Four times a day (QID) | ORAL | 3 refills | Status: DC | PRN

## 2022-05-23 NOTE — Progress Notes (Signed)
Reviewed PDMP substance reporting system prior to prescribing opiate medications. No inconsistencies noted.  Meds ordered this encounter  Medications   ibuprofen (ADVIL) 600 MG tablet    Sig: Take 1 tablet (600 mg total) by mouth every 6 (six) hours as needed for mild pain.    Dispense:  30 tablet    Refill:  3    Order Specific Question:   Supervising Provider    Answer:   Tresa Garter [[id number]]   Oxycodone HCl 10 MG TABS    Sig: Take 1 tablet (10 mg total) by mouth every 4 (four) hours while awake.    Dispense:  90 tablet    Refill:  0    Order Specific Question:   Supervising Provider    Answer:   Tresa Garter [[id number]]   [name]  APRN, MSN, FNP-C [facility] [address] [place], [place] [address] [phone]

## 2022-05-30 ENCOUNTER — Encounter (HOSPITAL_BASED_OUTPATIENT_CLINIC_OR_DEPARTMENT_OTHER): Payer: Medicaid Other | Attending: General Surgery | Admitting: General Surgery

## 2022-05-30 DIAGNOSIS — I10 Essential (primary) hypertension: Secondary | ICD-10-CM | POA: Diagnosis not present

## 2022-05-30 DIAGNOSIS — D571 Sickle-cell disease without crisis: Secondary | ICD-10-CM | POA: Diagnosis not present

## 2022-05-30 DIAGNOSIS — L97818 Non-pressure chronic ulcer of other part of right lower leg with other specified severity: Secondary | ICD-10-CM | POA: Diagnosis present

## 2022-05-30 DIAGNOSIS — I82531 Chronic embolism and thrombosis of right popliteal vein: Secondary | ICD-10-CM | POA: Insufficient documentation

## 2022-05-30 DIAGNOSIS — I872 Venous insufficiency (chronic) (peripheral): Secondary | ICD-10-CM | POA: Diagnosis not present

## 2022-05-30 NOTE — Progress Notes (Signed)
[name] ([id number]) [id number] Page 1 of 14 Visit Report for 05/30/2022 Chief Complaint Document Details Patient Name: Date of Service: [name] [name] 05/30/2022 10:15 A M Medical Record Number: [id number] Patient Account Number: [id number] Date of Birth/Sex: Treating RN: August 08, 1970 (52 y.o. F) Primary Care Provider: [name] Other Clinician: Referring Provider: Treating Provider/Extender: Elyse Jarvis Weeks in Treatment: 72 Information Obtained from: Patient Chief Complaint the patient is here for evaluation of her bilateral lower extremity sickle cell ulcers 04/17/2021; patient comes in for substantial wounds on the right and left lower leg Electronic Signature(s) Signed: 05/30/2022 10:44:42 AM By: Fredirick Maudlin MD FACS Entered By: Fredirick Maudlin on 05/30/2022 10:44:42 -------------------------------------------------------------------------------- HPI Details Patient Name: Date of Service: [name], [name] 05/30/2022 10:15 A M Medical Record Number: [id number] Patient Account Number: [id number] Date of Birth/Sex: Treating RN: 01/19/1971 (52 y.o. F) Primary Care Provider: [name] Other Clinician: Referring Provider: Treating Provider/Extender: Elyse Jarvis Weeks in Treatment: 44 History of Present Illness Location: medial and lateral ankle region on the right and left medial malleolus Quality: Patient reports experiencing a shooting pain to affected area(s). Severity: Patient states wound(s) are getting worse. Duration: right lower extremity bimalleolar ulcers have been present for approximately 2 years; the rright meedial malleolus ulcer has been there proximally 6 months Timing: Pain in wound is constant (hurts all the time) Context: The wound would happen gradually ssociated Signs and Symptoms: Patient reports having increase discharge. A HPI Description: 52 year old patient  with a history of sickle cell anemia who was last seen by me with ulceration of the right lower extremity above the ankle and was referred to Dr. Leland Johns for a surgical debridement as I was unable to do anything in the office due to excruciating pain. At that stage she was referred from the plastic surgery service to dermatology who treated her for a skin infection with doxycycline and then Levaquin and a local antibiotic ointment. I understand the patient has since developed ulceration on the left ankle both medial and lateral and was now referred back to the wound center as dermatology has finished the management. I do not have any notes from the dermatology department Old notes: 52 year old patient with a history of sickle cell anemia, pain bilateral lower extremities, right lower extremity ulcer and has a history of receiving a skin graft( Theraskin) several months ago. She has been visiting the wound center [facility] and was seen by Dr. Dellia Nims and Dr. Leland Johns. after prolonged conservator therapy between July 2016 and January 2017. She had been seen by the plastic surgeon and taken to the OR for debridement and application of Theraskin. She had 3 applications of Theraskin and was then treated with collagen. Prior to that she had a history of similar problems in 2014 and was treated conservatively. Had a reflux study done for the right lower extremity in August 2016 without reflux or DVT . Past medical history significant for sickle cell disease, anemia, leg ulcers, cholelithiasis,and has never been a smoker. Once the patient was discharged on the wound center she says within 2 or 3 weeks the problems recurred and she has been treating it conservatively. since I saw her 3 weeks ago at [facility] she has been unable to get her dressing material but has completed a course of doxycycline. 6/7/ 2017 -- lower extremity venous duplex reflux evaluation was done No evidence of SVT or DVT in the RLL. No  venous incompetence in the RLL. No further  vascular workup is indicated at this time. She was seen by Dr. [name], on 10/04/2015. She agreed with the plan of taking her to the OR for debridement and application of theraskin and would also take biopsies to rule out pyoderma gangrenosum. [name], [name] ([id number]) [id number] Page 2 of 14 Follow-up note dated May 31 received and she was status post application of Theraskin to multiple ulcers around the right ankle. Pathology did not show evidence of malignancy or pyoderma gangrenosum. She would continue to see as in the wound clinic for further care and see Dr. Leland Johns as needed. The patient brought the biopsy report and it was consistent with stasis ulcer no evidence of malignancy and the comment was that there was some adjacent neovascularization, fibrosis and patchy perivascular chronic inflammation. 11/15/2015 -- today we applied her first application of Theraskin 11/30/15; TheraSkin #2 12/13/2015 -- she is having a lot of pain locally and is here for possible application of a theraskin today. 01/16/2016 -- the patient has significant pain and has noticed despite in spite of all local care and oral pain medication. It is impossible to debride her in the office. 02/06/2016 -- I do not see any notes from Dr. Iran Planas( the patient has not made a call to the office know as she heard from them) and the only visit to recently was with her PCP Dr. Danella Penton -- I saw her on 01/16/2016 and prescribed 90 tablets of oxycodone 10 mg and did lab work and screening for HIV. the HIV was negative and hemoglobin was 6.3 with a WBC count of 14.9 and hematocrit of 17.8 with platelets of 561. 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 The bimalleolar ulcers to the right lower extremity have been present for  approximately 2 years, the left medial malleolus ulcer has been present approximately 6 months. She has followed with Dr.Thimmappa in the past and has had a total of 3 applications of Theraskin (01/2015, 09/2015, 06/17/16). She has also followed with Dr. Con Memos here in the clinic and has received 2 applications of TheraSkin (11/10/15, 11/30/15). The patient does experience chronic, and is not amenable to debridement. 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 She is not currently on any antibiotic therapy and has not been treated with any recently. 07/17/2016 -- was seen by Dr. Iran Planas of plastic surgery who saw her 2 weeks postop application of Theraskin #3. She had removed her dressing and asked her to apply silver alginate on alternate days and follow-up back with the wound center. Future debridements and application of skin substitute would have to be done in the hospital due to her high risk for anesthesia. READMISSION 04/17/2021 Patient is now a 52 year old woman that we have had in this clinic for a prolonged period of time and 2016-2017 and then again for 2 visits in February 2018. At that point she had wounds on the right lower leg predominantly medial. She had also been seen by plastic surgery Dr. Leland Johns who I believe took her to the OR for operative debridement and application of TheraSkin in 2017. After she left our clinic she was followed for a very prolonged period of time in the wound care center in [facility] who then referred her ultimately to [facility] where she was seen by Dr. Vernona Rieger. Again taken her to the OR for skin grafting which apparently did  not take. She had multiple other attempts at dressings although I have not really looked over all of these notes in great detail. She has not been seen in a wound care center in about a year. She states over the last year in addition to her right lower leg she has developed wounds on the left lower leg  quite extensive. She is using Xeroform to all of these wounds without really any improvement. She also has Medicaid which does not cover wound products. The patient has had vascular work-ups in the past including most recently on 03/28/2021 showing biphasic waveforms on the right triphasic at the PTA and biphasic at the dorsalis pedis on the left. She was unable to tolerate any degree of compression to do ABIs. Unfortunately TBI's were also not done. She had venous reflux studies done in 2017. This did not show any evidence of a DVT or SVT and no venous incompetence was noted in the right leg at the time this was the only side with the wound As noted I did not look all over her old records. She apparently had a course of HBO and [facility] although I am not sure what the indication would have been. In any case she developed seizures and terminated treatment earlier. She is generally much more disabled than when we last saw her in clinic. She can no longer walk pretty much wheelchair-bound because predominantly of pain in the left hip. 04/24/2021; the patient tolerated the wraps we put on. We used Santyl and Hydrofera Blue under compression. I brought her back for a nurse visit for a change in dressing. With Medicaid we will have a hard time getting anything paid for and hence the need for compression. She arrives in clinic with all the wounds looking somewhat better in terms of surface 12/20; circumferential wound on the right from the lateral to the medial. She has open areas on the left medial and left lateral x2 on all of this with the same surface. This does not look completely healthy although she does have some epithelialization. She is not complaining of a lot of pain which is unusual for her sickle ulcers. I have not looked over her extensive records from [facility]. She had recent arterial studies and has a history of venous reflux studies I will need to look these over although I do not believe she  has significant arterial disease 2023 05/22/2021; patient's wound areas measure slightly smaller. Still a lot of drainage coming from the right we have been using Hydrofera Blue and Santyl with some improvement in the wound surfaces. She tells me she will be getting transfused later in the week for her underlying sickle cell anemia I have looked over her recent arterial studies which were done in the fall. This was in November and showed biphasic and triphasic waveforms but she could not tolerate ABIs because of pressure and unfortunately TBI's were not done. She has not had recent venous reflux studies that I can see 1/10; not much change about the same surface area. This has a yellowish surface to it very gritty. We have been using Santyl and Hydrofera Blue for a prolonged period. Culture I did last week showed methicillin sensitive staph aureus "rare". Our intake nurse reports greenish drainage which may be the Hydrofera Blue itself 1/17; wounds are continue to measure smaller although I am not sure about the accuracy here. Especially the areas on the right are covered in what looks to be a nonviable surface although she  does have some epithelialization. Similarly she has areas on the left medial and left lateral ankle area which appear to have a better surface and perhaps are slightly smaller. We have been using Santyl and Hydrofera Blue. She cannot tolerate mechanical debridements She went for her reflux studies which showed significant reflux at the greater saphenous vein at the saphenofemoral junction as well as the greater saphenous vein in the proximal calf on the left she had reflux in the thigh and the common femoral vein and supra vein Fishel vein reflux in the greater saphenous vein. I will have vein and vascular look at this. My thoughts have been that these are likely sickle wounds. I looked through her old records from [facility] wound care center and then when she graduated to [facility] wound care center where she saw Dr. Zigmund Daniel and Dr. Vernona Rieger. Although I can see she had reflux studies done I do not see that she actually saw a vein and vascular. I went over the fact that she had operative debridements and actual skin grafting that did not take. I do not think these wounds have ever really progressed towards healing 1/31;Substantial wounds on the right ankle area. Hyper granulated very gritty adherent debris on the surface. 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 On the left she has no DVT or SVT deep vein reflux in the common femoral vein. Superficial vein reflux in the greater saphenous vein on although the vein diameter is not really all that large. I do not think there is anything that can be done with these although I am going to send her for consultation to vein and vascular. 2/7; Wound exam; substantial wound area on the right posterior ankle area and areas on the left medial ankle and left lateral ankle. I was able to debride the left medial ankle last week fairly aggressively and it is back this week to a completely nonviable surface She will see vascular surgery this Friday and I would like them to review the venous studies and also any comments on her arterial status. If they do not see an issue here I am going to refer her to plastic surgery for an operative debridement perhaps intraoperative ACell or Integra. Eventually she will require a deep GEANETTE, BUONOCORE ([id number]) [id number] Page 3 of 14 tissue culture again 2/14; substantial wound area on the right posterior  ankle, medial ankle. We have been using silver alginate The patient was seen by vein and vascular she had both venous reflux studies and arterial studies. 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 She had no evidence of superficial venous thrombosis. She did have superficial vein reflux at the saphenofemoral junction and the greater saphenous vein. On the left no evidence of a DVT no evidence of superficial venous throat thrombosis she did have deep vein reflux in the common femoral vein and superficial vein reflux in the greater saphenous vein but these were not felt to be amenable to ablation. In terms of arterial studies she had triphasic and wife  biphasic wave waveforms bilaterally not felt to have a significant arterial issue. I do not get the feeling that they felt that any part of her nonhealing wounds were related to either arterial or venous issues. They did note that she had venous reflux at the right at the [facility] and [facility]. And also on the left there were reflux in the deep system at the common femoral vein and greater saphenous vein in the proximal thigh. Nothing amenable to ablation. 2/20; she is making some decent progress on the right where there is nice skin between the 2 open areas on the right ankle. The surfaces here do not look viable yet there is some surrounding epithelialization. She still has a small area on the left medial ankle area. Hyper-granulated Jody's away always 2/28 patient has an appointment with plastic surgery on 3/8. We will see her back on 3/9. She may have to call us to get the area redressed. We've been using Santyl under silver alginate. We made a nice improvement on the left medial ankle. The larger wounds on the right also looks somewhat better in terms of epithelialization although I think they could benefit from an aggressive debridement if plastic surgery would be willing to do that.  Perhaps placement of Integra or a cell 07/26/2021: She saw Dr. Claudia Desanctis yesterday. He raised the question as to whether or not this might be pyoderma and wanted to wait until that question was answered by dermatology before proceeding with any sort of operative debridement. We have continue to use Santyl under silver alginate with Kerlix and Coban wraps. Overall, her wounds appear to be continuing to contract and epithelialize, with some granulation tissue present. There continues to be some slough on all wound surfaces. 08/09/2021: She has not been able to get an appointment with dermatology because apparently the offices in [place] do not accept Medicaid. She is looking into whether or not she can be seen at the main [facility] dermatology clinic. This is necessary because plastic surgery is concerned that her wounds might represent pyoderma and they did not want to do any procedure until that was clarified. We have been using Santyl under silver alginate with Kerlix and Coban wraps. Today, there was a greater amount of drainage on her dressings with a slight green discoloration and significant odor. Despite this, her wounds continue to contract and epithelialize. There is pale granulation tissue present and actually, on the left medial ankle, the granulation tissue is a bit hypertrophic. 08/16/2021: Last week, I took a culture and this grew back rare methicillin-resistant Staph aureus and rare corynebacterium. The MRSA was sensitive to gentamicin which we began applying topically on an empiric basis. This week, her wounds are a bit smaller and the drainage and odor are less. Her primary care provider is working on assisting the patient with a dermatology evaluation. She has been in silver alginate over the gentamicin that was started last week along with Kerlix and Coban wraps. 08/23/2021: Because she has Medicaid, we have been unable to get her into see any dermatologist in the  [place] to rule out pyoderma gangrenosum, which was a requirement from plastic surgery prior to any sort of debridement and grafting. Despite this, however, all of her wounds continue to get smaller. The wound on her left medial ankle is nearly closed. There is no odor from the wounds, although she still accumulates a modest amount of drainage on her dressings. 08/30/2021: The lateral right ankle wound and the  medial left ankle wound are a bit smaller today. The medial right ankle wound is about the same size. They are less tender. We have still been unable to get her into dermatology. 09/06/2021: All of the wounds are about the same size today. She continues to endorse minimal pain. I communicated with Dr. Claudia Desanctis in plastic surgery regarding our issues getting a dermatology appointment; he was out of town but indicated that he would look into perhaps performing the biopsy in his office and will have his office contact her. 09/14/2021: The patient has an appointment in dermatology, but it is not until October. Her wounds are roughly the same; she continues to have very thick purulent-looking drainage on her dressings. 09/20/2021: The left medial wound is nearly closed and just has a bit of accumulated eschar on the surface. The right medial and lateral ankle wounds are perhaps a little bit smaller. They continue to have a very pale surface with accumulation of thin slough. 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 I did not think [employer] was indicated based on this. She is getting topical mupirocin with Prisma silver collagen. 10/04/2021: The patient was not seen in clinic last week due to childcare coverage issues. In the interim, the left medial leg wound has closed. The right sided leg wounds are smaller. There is more granulation tissue coming through, particularly on the lateral wound. The surface remains somewhat gritty. We have been applying topical mupirocin and Prisma silver  collagen. 10/11/2021: The left medial leg wound remains closed. She does complain of some anesthetic sensation to the area. Both of the right-sided leg wounds are smaller but still have accumulated slough. 10/18/2021: Both right-sided leg wounds are minimally smaller this week. She still continues to accumulate slough and has thick drainage on her dressings. 10/23/2021: Both wounds continue to contract. There is still slough buildup. She has been approved for a keratin-based skin substitute trial product but it will not be available until next week. 10/30/2021: The wounds are about the same to perhaps slightly smaller. There is still continued slough buildup. Unfortunately, the rep for the keratin based product did not show up today and did not answer his phone when called. 11/08/2021: The wounds are little bit smaller today. She continues to have thick drainage but the surfaces are relatively clean with just a little bit of slough accumulation. She reported to me today that she is unable to completely flex her left ankle and on examination it seems this is potentially related to scar tissue from her wounds. We do have the ProgenaMatrix trial product available for her today. 11/15/2021: Both wounds are smaller today. There is some slough accumulation on the surfaces, but the medial wound, in particular looks like it is filling in and is less deep. She did hear from physical therapy and she is going to start working with them on July 11. She is here for her second application of the trial skin substitute, ProgenaMatrix. 11/22/2021: Both wounds continue to contract, the medial more dramatically than the lateral. Both wounds have a layer of slough on the surface, but underneath this, the gritty fibrous tissue has a little bit more of a pink cast to it rather than being as pale as it has been. 11/29/2021: The wounds are roughly the same size this week, perhaps a millimeter or 2 smaller. The medial wound has filled  in and is nearly flush with the surrounding skin surface. She continues to have a lot of  slough accumulation on both surfaces. 12/06/2021: No significant change to her wounds, but she has a new opening on her dorsal foot, just distal to the right lateral ankle wound. The area on her left medial ankle that reopened looks a little bit larger today. She has quite a bit of pain associated with the new wound. 12/12/2021: Her wounds look about the same but the new opening on her right lateral dorsal foot is a little bit bigger. She continues to have a fair amount of pain with this wound. 12/26/2021: The left medial ankle wound is tiny and superficial. She has 2 areas of crusting on her left lateral ankle, however, that appear to be threatening to [name], [name] ([id number]) [id number] Page 4 of 14 open again. Her right medial ankle wound is a little bit smaller today but still continues to accumulate thick rubbery slough. The new dorsal foot wound is exquisitely painful but there is no odor or purulent drainage. No erythema or induration. The right lateral ankle wound looks about the same today, again with thick rubbery slough. 01/03/2022: The left medial ankle wound has closed again. Both right ankle wounds appear to be about the same size with thick rubbery slough. The dorsal foot wound on the right continues to be quite painful and she stated that she did not want any debridement of that site today. 01/10/2022: No real change to any of her wounds. She continues to accumulate thick slough. The dorsal foot wound has merged with the lateral malleolar wound. She is experiencing significant pain in the dorsal foot portion of the ulcer. 01/16/2022: Absolutely no change or progress in her wounds. 01/24/2022: Her wounds are unchanged. She continues to build up slough and the wounds on her dorsal right foot are still exquisitely tender. 01/31/2022: The wounds actually measure a little bit  narrower today. They still have thick slough on the surface but the underlying tissue seems a little less fibrotic. We changed to Iodosorb last week. 02/07/2022: Wounds continue to slowly epithelialize around the parameters. She has less pain today. She still accumulates a fairly substantial layer of slough. 02/15/2022: No change to the wounds overall. The measured a little bit larger today per the intake nurse. She continues to have substantial slough accumulation and her pain is a little bit worse. 02/21/2022. No change at all to her wounds. I do not see that plastic surgery has received her referral yet. 02/28/2022: The wounds remain unchanged. They are dry and fibrotic with accumulation of slough and eschar. She did receive an appointment to see plastic surgery on October 23, but the office called her back and indicated they needed to reschedule and that the next available appointment was not until December. The patient became angry and decided she did not want to see this plastics group. 10/19; the patient sees Dr. Lovett Calender again next week. She is using Medihoney as the primary dressing changing this herself. 03/21/2022: She saw Dr. Jaclynn Guarneri at the wound care center at [facility]. Dr. Jaclynn Guarneri was also unable to debride her, secondary to pain and is planning to take her to the operating room for operative debridement and potential skin substitute placement. Her wounds are unchanged with thick yellow slough and a fibrotic base. She says they are too painful to be debrided. In addition, yesterday her hemoglobin was 4 and she received 2 units packed red blood cell transfusion. 04/04/2022: Her operation at [facility] is scheduled for December 15. She continues to use Medihoney on her  wounds. They are a little bit less painful today and she is willing to entertain the possibility of debridement. The wounds measured a little bit larger in all dimensions today but the layer of slough is  not as thick as usual. 04/18/2022: Her wounds actually measured a little bit smaller today and the extension onto the dorsal foot from the lateral wound has healed. They are less tender and there is significantly less slough present as compared to prior visits. 05/09/2022: She had to reschedule her surgery due to lack of childcare. It is now planned for January 5. The wounds are basically unchanged, but she had a lot more drainage, which was thick and somewhat purulent in appearance. No significant odor. 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 They are quite painful today and she does not want to have debridement performed. 05/30/2022: The patient underwent surgical debridement and placement of TheraSkin to her wounds on January 5. She has been doing well and does not endorse any pain. The TheraSkin is intact and looks beautiful. It is sutured in place. There is no exudate or significant drainage. Electronic Signature(s) Signed: 05/30/2022 10:46:08 AM By: Fredirick Maudlin MD FACS Entered By: Fredirick Maudlin on 05/30/2022 10:46:08 -------------------------------------------------------------------------------- Physical Exam Details Patient Name: Date of Service: [name], [name] 05/30/2022 10:15 A M Medical Record Number: [id number] Patient Account Number: [id number] Date of Birth/Sex: Treating RN: Jun 02, 1970 (52 y.o. F) Primary Care Provider: [name] Other Clinician: Referring Provider: Treating Provider/Extender: Elyse Jarvis Weeks in Treatment: 35 Constitutional Hypertensive, asymptomatic. . . . no acute distress. Respiratory Normal work of breathing on room air. Notes 05/30/2022: The patient underwent surgical debridement and placement of TheraSkin to her wounds on January 5. The TheraSkin is intact and looks beautiful. It is sutured in place. There is no exudate or significant drainage. Electronic Signature(s) Signed: 05/30/2022 10:49:05 AM  By: Fredirick Maudlin MD FACS [place], Legrand Rams ([id number]) [id number] Page 5 of 14 Entered By: Fredirick Maudlin on 05/30/2022 10:49:05 -------------------------------------------------------------------------------- Physician Orders Details Patient Name: Date of Service: [name] [name] 05/30/2022 10:15 A M Medical Record Number: [id number] Patient Account Number: [id number] Date of Birth/Sex: Treating RN: 10/10/1970 (52 y.o. [name] Primary Care Provider: [name] Other Clinician: Referring Provider: Treating Provider/Extender: Elyse Jarvis Weeks in Treatment: 8 Verbal / Phone Orders: No Diagnosis Coding ICD-10 Coding Code Description L97.818 Non-pressure chronic ulcer of other part of right lower leg with other specified severity 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 Follow-up Appointments ppointment in 1 week. - Dr. Celine Ahr - room 3 Return A Cellular or Tissue Based Products Other Cellular or Tissue Based Products Orders/Instructions: - Theraskin applied by plastic surgeon, leave on until further notice Bathing/ Shower/ Hygiene Do not shower or bathe in tub. Edema Control - Lymphedema / SCD / Other Avoid standing for long periods of time. Exercise regularly Additional Orders / Instructions Follow Nutritious Diet Wound Treatment Wound #17 - Lower Leg Wound Laterality: Right, Lateral Cleanser: Soap and Water Every Other Day/30 Days Discharge Instructions: May shower and wash wound with dial antibacterial soap and water prior to dressing change. Cleanser: Wound Cleanser Every Other Day/30 Days Discharge Instructions: Cleanse the wound with wound cleanser prior to applying a clean dressing using gauze sponges, not tissue or cotton balls. Peri-Wound Care: Triamcinolone 15 (g) Every Other Day/30 Days Discharge Instructions: Use triamcinolone 15 (g) as directed Peri-Wound Care: Sween Lotion (Moisturizing  lotion) Every Other Day/30 Days Discharge Instructions: Apply moisturizing lotion as  directed Prim Dressing: ADAPTIC TOUCH 3x4.25 (in/in) Every Other Day/30 Days ary Discharge Instructions: Apply to wound bed as instructed Secondary Dressing: ABD Pad, 5x9 Every Other Day/30 Days Discharge Instructions: Apply over primary dressing as directed. Secured With: Elastic Bandage 4 inch (ACE bandage) Every Other Day/30 Days Discharge Instructions: Secure with ACE bandage as directed. Compression Wrap: Kerlix Roll 4.5x3.1 (in/yd) Every Other Day/30 Days Discharge Instructions: Apply Kerlix and Coban compression as directed. Wound #21 - Ankle Wound Laterality: Right, Medial Cleanser: Soap and Water Every Other Day/30 Days Discharge Instructions: May shower and wash wound with dial antibacterial soap and water prior to dressing change. Cleanser: Wound Cleanser Every Other Day/30 Days Discharge Instructions: Cleanse the wound with wound cleanser prior to applying a clean dressing using gauze sponges, not tissue or cotton balls. [name], [name] ([id number]) [id number] Page 6 of 14 Peri-Wound Care: Triamcinolone 15 (g) Every Other Day/30 Days Discharge Instructions: Use triamcinolone 15 (g) as directed Peri-Wound Care: Sween Lotion (Moisturizing lotion) Every Other Day/30 Days Discharge Instructions: Apply moisturizing lotion as directed Prim Dressing: ADAPTIC TOUCH 3x4.25 (in/in) Every Other Day/30 Days ary Discharge Instructions: Apply to wound bed as instructed Secondary Dressing: ABD Pad, 5x9 Every Other Day/30 Days Discharge Instructions: Apply over primary dressing as directed. Secured With: Elastic Bandage 4 inch (ACE bandage) Every Other Day/30 Days Discharge Instructions: Secure with ACE bandage as directed. Compression Wrap: Kerlix Roll 4.5x3.1 (in/yd) Every Other Day/30 Days Discharge Instructions: Apply Kerlix and Coban compression as directed. Electronic  Signature(s) Signed: 05/30/2022 11:14:46 AM By: Fredirick Maudlin MD FACS Entered By: Fredirick Maudlin on 05/30/2022 10:49:18 -------------------------------------------------------------------------------- Problem List Details Patient Name: Date of Service: [name], [name] 05/30/2022 10:15 A M Medical Record Number: [id number] Patient Account Number: [id number] Date of Birth/Sex: Treating RN: [birth year] (52 y.o. F) Primary Care Provider: [name] Other Clinician: Referring Provider: Treating Provider/Extender: Elyse Jarvis Weeks in Treatment: 53 Active Problems ICD-10 Encounter Code Description Active Date MDM Diagnosis L97.818 Non-pressure chronic ulcer of other part of right lower leg with other specified 04/17/2021 No Yes severity 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 04/17/2021 No Yes Inactive Problems ICD-10 Code Description Active Date Inactive Date L97.828 Non-pressure chronic ulcer of other part of left lower leg with other specified severity 04/17/2021 04/17/2021 Resolved Problems Electronic Signature(s) Signed: 05/30/2022 10:43:52 AM By: Fredirick Maudlin MD FACS Entered By: Fredirick Maudlin on 05/30/2022 10:43:52 [name] ([id number]) [id number] Page 7 of 14 -------------------------------------------------------------------------------- Progress Note Details Patient Name: Date of Service: [name] [name] 05/30/2022 10:15 A M Medical Record Number: [id number] Patient Account Number: [id number] Date of Birth/Sex: Treating RN: [birth year] (52 y.o. F) Primary Care Provider: [name] Other Clinician: Referring Provider: Treating Provider/Extender: Elyse Jarvis Weeks in Treatment: 64 Subjective Chief Complaint Information obtained from Patient the patient is here for evaluation of her bilateral lower extremity sickle cell ulcers 04/17/2021; patient comes in for substantial  wounds on the right and left lower leg History of Present Illness (HPI) The following HPI elements were documented for the patient's wound: Location: medial and lateral ankle region on the right and left medial malleolus Quality: Patient reports experiencing a shooting pain to affected area(s). Severity: Patient states wound(s) are getting worse. Duration: right lower extremity bimalleolar ulcers have been present for approximately 2 years; the rright meedial malleolus ulcer has been there proximally 6 months Timing: Pain in wound is constant (hurts all the time) Context: The wound would happen gradually Associated Signs and Symptoms: Patient reports having  increase discharge. 52 year old patient with a history of sickle cell anemia who was last seen by me with ulceration of the right lower extremity above the ankle and was referred to Dr. Leland Johns for a surgical debridement as I was unable to do anything in the office due to excruciating pain. At that stage she was referred from the plastic surgery service to dermatology who treated her for a skin infection with doxycycline and then Levaquin and a local antibiotic ointment. I understand the patient has since developed ulceration on the left ankle both medial and lateral and was now referred back to the wound center as dermatology has finished the management. I do not have any notes from the dermatology department Old notes: 52 year old patient with a history of sickle cell anemia, pain bilateral lower extremities, right lower extremity ulcer and has a history of receiving a skin graft( Theraskin) several months ago. She has been visiting the wound center [facility] and was seen by Dr. Dellia Nims and Dr. Leland Johns. after prolonged conservator therapy between July 2016 and January 2017. She had been seen by the plastic surgeon and taken to the OR for debridement and application of Theraskin. She had 3 applications of Theraskin and was then treated  with collagen. Prior to that she had a history of similar problems in 2014 and was treated conservatively. Had a reflux study done for the right lower extremity in August 2016 without reflux or DVT . Past medical history significant for sickle cell disease, anemia, leg ulcers, cholelithiasis,and has never been a smoker. Once the patient was discharged on the wound center she says within 2 or 3 weeks the problems recurred and she has been treating it conservatively. since I saw her 3 weeks ago at [facility] she has been unable to get her dressing material but has completed a course of doxycycline. 6/7/ 2017 -- lower extremity venous duplex reflux evaluation was done oo No evidence of SVT or DVT in the RLL. No venous incompetence in the RLL. No further vascular workup is indicated at this time. She was seen by Dr. [name], on 10/04/2015. She agreed with the plan of taking her to the OR for debridement and application of theraskin and would also take biopsies to rule out pyoderma gangrenosum. Follow-up note dated May 31 received and she was status post application of Theraskin to multiple ulcers around the right ankle. Pathology did not show evidence of malignancy or pyoderma gangrenosum. She would continue to see as in the wound clinic for further care and see Dr. Leland Johns as needed. The patient brought the biopsy report and it was consistent with stasis ulcer no evidence of malignancy and the comment was that there was some adjacent neovascularization, fibrosis and patchy perivascular chronic inflammation. 11/15/2015 -- today we applied her first application of Theraskin 11/30/15; TheraSkin #2 12/13/2015 -- she is having a lot of pain locally and is here for possible application of a theraskin today. 01/16/2016 -- the patient has significant pain and has noticed despite in spite of all local care and oral pain medication. It is impossible to debride her in the office. 02/06/2016 -- I do not  see any notes from Dr. Iran Planas( the patient has not made a call to the office know as she heard from them) and the only visit to recently was with her PCP Dr. Danella Penton -- I saw her on 01/16/2016 and prescribed 90 tablets of oxycodone 10 mg and did lab work and screening for HIV. the HIV was  negative and hemoglobin was 6.3 with a WBC count of 14.9 and hematocrit of 17.8 with platelets of 561. 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 The bimalleolar ulcers to the right lower extremity have been present for approximately 2 years, the left medial malleolus ulcer has been present approximately 6 months. She has followed with Dr.Thimmappa in the past and has had a total of 3 applications of Theraskin (01/2015, 09/2015, 06/17/16). She has also followed with Dr. Con Memos here in the clinic and has received 2 applications of TheraSkin (11/10/15, 11/30/15). The patient does experience chronic, and is not amenable to debridement. 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 She is not currently on any antibiotic therapy and has not been treated with any recently. 07/17/2016 -- was seen by Dr. Iran Planas of plastic surgery who saw her 2 weeks postop application of Theraskin #3. She had removed her dressing and asked her to apply silver alginate on alternate days and follow-up back with the wound center. Future debridements and application of skin substitute would have to be done in the hospital due to her high risk for anesthesia. READMISSION 04/17/2021 Patient is now a 52 year old woman that we have had in this clinic for a prolonged period of time and 2016-2017 and then again for 2 visits in February 2018. At that point she had wounds on the right lower leg predominantly medial. She had also been seen by plastic surgery Dr. Leland Johns who I believe took her to  the OR for operative debridement and application of TheraSkin in 2017. After she left our clinic she was followed for a very prolonged period of time in the wound care center in [facility] who then referred her ultimately to [facility] where she was seen by Dr. Vernona Rieger. Again taken her to the OR for skin grafting which apparently did not take. She had multiple other attempts at dressings although I have not really looked over all of these notes in great detail. She has not been seen in a wound care center in about a year. She states over the last year in addition to her right lower leg she has developed wounds on the left lower leg quite [place], Legrand Rams ([id number]) [id number] Page 8 of 14 extensive. She is using Xeroform to all of these wounds without really any improvement. She also has Medicaid which does not cover wound products. The patient has had vascular work-ups in the past including most recently on 03/28/2021 showing biphasic waveforms on the right triphasic at the PTA and biphasic at the dorsalis pedis on the left. She was unable to tolerate any degree of compression to do ABIs. Unfortunately TBI's were also not done. She had venous reflux studies done in 2017. This did not show any evidence of a DVT or SVT and no venous incompetence was noted in the right leg at the time this was the only side with the wound As noted I did not look all over her old records. She apparently had a course of [facility] and [facility] although I am not sure what the indication would have been. In any case she developed seizures and terminated treatment earlier. She is generally much more disabled than when we last saw her in clinic. She can no longer walk pretty much wheelchair-bound because predominantly of pain in the left hip. 04/24/2021; the patient tolerated the wraps we put  on. We used Santyl and Hydrofera Blue under compression. I brought her back for a nurse visit for a change in dressing.  With Medicaid we will have a hard time getting anything paid for and hence the need for compression. She arrives in clinic with all the wounds looking somewhat better in terms of surface 12/20; circumferential wound on the right from the lateral to the medial. She has open areas on the left medial and left lateral x2 on all of this with the same surface. This does not look completely healthy although she does have some epithelialization. She is not complaining of a lot of pain which is unusual for her sickle ulcers. I have not looked over her extensive records from [facility]. She had recent arterial studies and has a history of venous reflux studies I will need to look these over although I do not believe she has significant arterial disease 2023 05/22/2021; patient's wound areas measure slightly smaller. Still a lot of drainage coming from the right we have been using Hydrofera Blue and Santyl with some improvement in the wound surfaces. She tells me she will be getting transfused later in the week for her underlying sickle cell anemia I have looked over her recent arterial studies which were done in the fall. This was in November and showed biphasic and triphasic waveforms but she could not tolerate ABIs because of pressure and unfortunately TBI's were not done. She has not had recent venous reflux studies that I can see 1/10; not much change about the same surface area. This has a yellowish surface to it very gritty. We have been using Santyl and Hydrofera Blue for a prolonged period. Culture I did last week showed methicillin sensitive staph aureus "rare". Our intake nurse reports greenish drainage which may be the Hydrofera Blue itself 1/17; wounds are continue to measure smaller although I am not sure about the accuracy here. Especially the areas on the right are covered in what looks to be a nonviable surface although she does have some epithelialization. Similarly she has areas on the left medial  and left lateral ankle area which appear to have a better surface and perhaps are slightly smaller. We have been using Santyl and Hydrofera Blue. She cannot tolerate mechanical debridements She went for her reflux studies which showed significant reflux at the greater saphenous vein at the saphenofemoral junction as well as the greater saphenous vein in the proximal calf on the left she had reflux in the thigh and the common femoral vein and supra vein Fishel vein reflux in the greater saphenous vein. I will have vein and vascular look at this. My thoughts have been that these are likely sickle wounds. I looked through her old records from [facility] wound care center and then when she graduated to [facility] wound care center where she saw Dr. Zigmund Daniel and Dr. Vernona Rieger. Although I can see she had reflux studies done I do not see that she actually saw a vein and vascular. I went over the fact that she had operative debridements and actual skin grafting that did not take. I do not think these wounds have ever really progressed towards healing 1/31;Substantial wounds on the right ankle area. Hyper granulated very gritty adherent debris on the surface. 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 On the left she has no DVT or SVT deep vein reflux in the common femoral vein. Superficial vein reflux in the greater saphenous vein on although the vein diameter is not really all that large. I do not think there is anything that can be done with these although I am going to send her for consultation to vein and vascular. 2/7; Wound exam; substantial  wound area on the right posterior ankle area and areas on the left medial ankle and left lateral ankle. I was able to debride the left medial ankle last week fairly aggressively and it is back this week to a completely nonviable surface She will see vascular surgery this Friday and I would like them to review the venous studies and also any comments on her arterial status. If they do not see an issue here I am going to refer her to plastic surgery for an operative debridement perhaps intraoperative ACell or Integra. Eventually she will require a deep tissue culture again 2/14; substantial wound area on the right posterior ankle, medial ankle. We have been using silver alginate The patient was seen by vein and vascular she had both venous reflux studies and arterial studies. 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 She had no evidence of superficial venous thrombosis. She did have superficial vein reflux at the saphenofemoral junction and the greater saphenous vein. On the left no evidence of a DVT no evidence of superficial venous throat thrombosis she did have deep vein reflux in the common femoral vein and superficial vein reflux in the greater saphenous vein but these were not felt to be amenable to ablation. In terms of arterial studies she had triphasic and wife biphasic wave waveforms bilaterally not felt to have a significant arterial issue. I do not get the feeling that they felt that any part of her nonhealing wounds were related to either arterial or venous issues. They did note that she had venous reflux at the right at the [facility] and [facility]. And also on the left there were reflux in the deep system at the common femoral vein and greater saphenous vein in the proximal thigh. Nothing amenable to ablation. 2/20; she is making some decent progress on the right where there is nice skin between the 2 open areas on the right  ankle. The surfaces here do not look viable yet there is some surrounding epithelialization. She still has a small area on the left medial ankle area. Hyper-granulated Jody's away always 2/28 patient has an appointment with plastic surgery on 3/8. We will see her back on 3/9. She may have to call us to get the area redressed. We've been using Santyl under silver alginate. We made a nice improvement on the left medial ankle. The larger wounds on the right also looks somewhat better in terms of epithelialization although I think they could benefit from an aggressive debridement if plastic surgery would be willing to do that. Perhaps placement of Integra or a cell 07/26/2021: She saw Dr. Claudia Desanctis yesterday. He raised the question as to whether or not this might be pyoderma and wanted to wait until that question was answered by dermatology before proceeding with any sort of operative debridement. We have continue to use  Santyl under silver alginate with Kerlix and Coban wraps. Overall, her wounds appear to be continuing to contract and epithelialize, with some granulation tissue present. There continues to be some slough on all wound surfaces. 08/09/2021: She has not been able to get an appointment with dermatology because apparently the offices in [place] do not accept Medicaid. She is looking into whether or not she can be seen at the main [facility] dermatology clinic. This is necessary because plastic surgery is concerned that her wounds might represent pyoderma and they did not want to do any procedure until that was clarified. We have been using Santyl under silver alginate with Kerlix and Coban wraps. Today, there was a greater amount of drainage on her dressings with a slight green discoloration and significant odor. Despite this, her wounds continue to contract and epithelialize. There is pale granulation tissue present and actually, on the left medial ankle, the granulation  tissue is a bit hypertrophic. [name], [name] ([id number]) [id number] Page 9 of 14 08/16/2021: Last week, I took a culture and this grew back rare methicillin-resistant Staph aureus and rare corynebacterium. The MRSA was sensitive to gentamicin which we began applying topically on an empiric basis. This week, her wounds are a bit smaller and the drainage and odor are less. Her primary care provider is working on assisting the patient with a dermatology evaluation. She has been in silver alginate over the gentamicin that was started last week along with Kerlix and Coban wraps. 08/23/2021: Because she has Medicaid, we have been unable to get her into see any dermatologist in the [place] to rule out pyoderma gangrenosum, which was a requirement from plastic surgery prior to any sort of debridement and grafting. Despite this, however, all of her wounds continue to get smaller. The wound on her left medial ankle is nearly closed. There is no odor from the wounds, although she still accumulates a modest amount of drainage on her dressings. 08/30/2021: The lateral right ankle wound and the medial left ankle wound are a bit smaller today. The medial right ankle wound is about the same size. They are less tender. We have still been unable to get her into dermatology. 09/06/2021: All of the wounds are about the same size today. She continues to endorse minimal pain. I communicated with Dr. Claudia Desanctis in plastic surgery regarding our issues getting a dermatology appointment; he was out of town but indicated that he would look into perhaps performing the biopsy in his office and will have his office contact her. 09/14/2021: The patient has an appointment in dermatology, but it is not until October. Her wounds are roughly the same; she continues to have very thick purulent-looking drainage on her dressings. 09/20/2021: The left medial wound is nearly closed and just has a bit of accumulated eschar on  the surface. The right medial and lateral ankle wounds are perhaps a little bit smaller. They continue to have a very pale surface with accumulation of thin slough. 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 I did not think [employer] was indicated based on this. She is getting topical mupirocin with Prisma silver collagen. 10/04/2021: The patient was not seen in clinic last week due to childcare coverage issues. In the interim, the left medial leg wound has closed. The right sided leg wounds are smaller. There is more granulation tissue coming through, particularly on the lateral wound. The surface remains somewhat gritty. We have been applying topical mupirocin and  Prisma silver collagen. 10/11/2021: The left medial leg wound remains closed. She does complain of some anesthetic sensation to the area. Both of the right-sided leg wounds are smaller but still have accumulated slough. 10/18/2021: Both right-sided leg wounds are minimally smaller this week. She still continues to accumulate slough and has thick drainage on her dressings. 10/23/2021: Both wounds continue to contract. There is still slough buildup. She has been approved for a keratin-based skin substitute trial product but it will not be available until next week. 10/30/2021: The wounds are about the same to perhaps slightly smaller. There is still continued slough buildup. Unfortunately, the rep for the keratin based product did not show up today and did not answer his phone when called. 11/08/2021: The wounds are little bit smaller today. She continues to have thick drainage but the surfaces are relatively clean with just a little bit of slough accumulation. She reported to me today that she is unable to completely flex her left ankle and on examination it seems this is potentially related to scar tissue from her wounds. We do have the ProgenaMatrix trial product available for her today. 11/15/2021: Both wounds are  smaller today. There is some slough accumulation on the surfaces, but the medial wound, in particular looks like it is filling in and is less deep. She did hear from physical therapy and she is going to start working with them on July 11. She is here for her second application of the trial skin substitute, ProgenaMatrix. 11/22/2021: Both wounds continue to contract, the medial more dramatically than the lateral. Both wounds have a layer of slough on the surface, but underneath this, the gritty fibrous tissue has a little bit more of a pink cast to it rather than being as pale as it has been. 11/29/2021: The wounds are roughly the same size this week, perhaps a millimeter or 2 smaller. The medial wound has filled in and is nearly flush with the surrounding skin surface. She continues to have a lot of slough accumulation on both surfaces. 12/06/2021: No significant change to her wounds, but she has a new opening on her dorsal foot, just distal to the right lateral ankle wound. The area on her left medial ankle that reopened looks a little bit larger today. She has quite a bit of pain associated with the new wound. 12/12/2021: Her wounds look about the same but the new opening on her right lateral dorsal foot is a little bit bigger. She continues to have a fair amount of pain with this wound. 12/26/2021: The left medial ankle wound is tiny and superficial. She has 2 areas of crusting on her left lateral ankle, however, that appear to be threatening to open again. Her right medial ankle wound is a little bit smaller today but still continues to accumulate thick rubbery slough. The new dorsal foot wound is exquisitely painful but there is no odor or purulent drainage. No erythema or induration. The right lateral ankle wound looks about the same today, again with thick rubbery slough. 01/03/2022: The left medial ankle wound has closed again. Both right ankle wounds appear to be about the same size with thick  rubbery slough. The dorsal foot wound on the right continues to be quite painful and she stated that she did not want any debridement of that site today. 01/10/2022: No real change to any of her wounds. She continues to accumulate thick slough. The dorsal foot wound has merged with the lateral malleolar wound. She is experiencing  significant pain in the dorsal foot portion of the ulcer. 01/16/2022: Absolutely no change or progress in her wounds. 01/24/2022: Her wounds are unchanged. She continues to build up slough and the wounds on her dorsal right foot are still exquisitely tender. 01/31/2022: The wounds actually measure a little bit narrower today. They still have thick slough on the surface but the underlying tissue seems a little less fibrotic. We changed to Iodosorb last week. 02/07/2022: Wounds continue to slowly epithelialize around the parameters. She has less pain today. She still accumulates a fairly substantial layer of slough. 02/15/2022: No change to the wounds overall. The measured a little bit larger today per the intake nurse. She continues to have substantial slough accumulation and her pain is a little bit worse. 02/21/2022. No change at all to her wounds. I do not see that plastic surgery has received her referral yet. 02/28/2022: The wounds remain unchanged. They are dry and fibrotic with accumulation of slough and eschar. She did receive an appointment to see plastic surgery on October 23, but the office called her back and indicated they needed to reschedule and that the next available appointment was not until December. The patient became angry and decided she did not want to see this plastics group. 10/19; the patient sees Dr. Lovett Calender again next week. She is using Medihoney as the primary dressing changing this herself. 03/21/2022: She saw Dr. Jaclynn Guarneri at the wound care center at [facility]. Dr. Jaclynn Guarneri was also unable to debride her, secondary to pain and is  planning to take her to the operating room for operative debridement and potential skin substitute placement. Her wounds are unchanged with thick yellow slough and a fibrotic base. She says they are too painful to be debrided. In addition, yesterday her hemoglobin was 4 and she received 2 units packed Topping, Kaylan ([id number]) [id number].pdf Page 10 of 14 red blood cell transfusion. 04/04/2022: Her operation at [facility] is scheduled for December 15. She continues to use Medihoney on her wounds. They are a little bit less painful today and she is willing to entertain the possibility of debridement. The wounds measured a little bit larger in all dimensions today but the layer of slough is not as thick as usual. 04/18/2022: Her wounds actually measured a little bit smaller today and the extension onto the dorsal foot from the lateral wound has healed. They are less tender and there is significantly less slough present as compared to prior visits. 05/09/2022: She had to reschedule her surgery due to lack of childcare. It is now planned for January 5. The wounds are basically unchanged, but she had a lot more drainage, which was thick and somewhat purulent in appearance. No significant odor. 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 They are quite painful today and she does not want to have debridement performed. 05/30/2022: The patient underwent surgical debridement and placement of TheraSkin to her wounds on January 5. She has been doing well and does not endorse any pain. The TheraSkin is intact and looks beautiful. It is sutured in place. There is no exudate or significant drainage. Patient History Information obtained from Patient. Family History Diabetes - Mother, Lung Disease - Mother, No family history of Cancer, Heart Disease, Hereditary Spherocytosis, Hypertension, Kidney Disease, Seizures, Stroke, Thyroid Problems, Tuberculosis. Social History Never  smoker, Marital Status - Married, Alcohol Use - Never, Drug Use - No History, Caffeine Use - Daily. Medical History Eyes Denies history of Cataracts, Glaucoma, Optic Neuritis  Ear/Nose/Mouth/Throat Denies history of Chronic sinus problems/congestion, Middle ear problems Hematologic/Lymphatic Patient has history of Anemia, Sickle Cell Disease Denies history of Hemophilia, Human Immunodeficiency Virus, Lymphedema Respiratory Denies history of Aspiration, Asthma, Chronic Obstructive Pulmonary Disease (COPD), Pneumothorax, Sleep Apnea, Tuberculosis Cardiovascular Denies history of Angina, Arrhythmia, Congestive Heart Failure, Coronary Artery Disease, Deep Vein Thrombosis, Hypertension, Hypotension, Myocardial Infarction, Peripheral Arterial Disease, Peripheral Venous Disease, Phlebitis, Vasculitis Gastrointestinal Denies history of Cirrhosis , Colitis, Crohnoos, Hepatitis A, Hepatitis B, Hepatitis C Endocrine Denies history of Type I Diabetes, Type II Diabetes Genitourinary Denies history of End Stage Renal Disease Immunological Denies history of Lupus Erythematosus, Raynaudoos, Scleroderma Integumentary (Skin) Denies history of History of Burn Musculoskeletal Denies history of Gout, Rheumatoid Arthritis, Osteoarthritis, Osteomyelitis Neurologic Patient has history of Neuropathy - right foot intermittant Denies history of Dementia, Quadriplegia, Paraplegia, Seizure Disorder Oncologic Denies history of Received Chemotherapy, Received Radiation Psychiatric Denies history of Anorexia/bulimia, Confinement Anxiety Hospitalization/Surgery History - c section x2. - left breast lumpectomy. - iandD right ankle with theraskin. Medical A Surgical History Notes nd Constitutional Symptoms (General Health) H/O miscarriage Cardiovascular bradycardia Gastrointestinal cholilithiasis Objective Constitutional Hypertensive, asymptomatic. no acute distress. Vitals Time Taken: 10:26 AM, Height:  67 in, Temperature: 98.1 F, Pulse: 85 bpm, Respiratory Rate: 16 breaths/min, Blood Pressure: 162/73 mmHg. Respiratory Normal work of breathing on room air. [name], [name] ([id number]) [id number] Page 11 of 14 General Notes: 05/30/2022: The patient underwent surgical debridement and placement of TheraSkin to her wounds on January 5. The TheraSkin is intact and looks beautiful. It is sutured in place. There is no exudate or significant drainage. Integumentary (Hair, Skin) Wound #17 status is Open. Original cause of wound was Gradually Appeared. The date acquired was: 10/05/2012. The wound has been in treatment 58 weeks. The wound is located on the Right,Lateral Lower Leg. The wound measures 6.6cm length x 5.2cm width x 0.1cm depth; 26.955cm^2 area and 2.695cm^3 volume. There is Fat Layer (Subcutaneous Tissue) exposed. There is no tunneling or undermining noted. There is a medium amount of serosanguineous drainage noted. The wound margin is distinct with the outline attached to the wound base. There is small (1-33%) pink, pale granulation within the wound bed. There is a large (67-100%) amount of necrotic tissue within the wound bed including Adherent Slough. The periwound skin appearance had no abnormalities noted for moisture. The periwound skin appearance had no abnormalities noted for color. The periwound skin appearance exhibited: Scarring. Periwound temperature was noted as No Abnormality. Wound #21 status is Open. Original cause of wound was Gradually Appeared. The date acquired was: 06/26/2021. The wound has been in treatment 48 weeks. The wound is located on the Right,Medial Ankle. The wound measures 8.3cm length x 3.1cm width x 0.1cm depth; 20.208cm^2 area and 2.021cm^3 volume. There is Fat Layer (Subcutaneous Tissue) exposed. There is no tunneling or undermining noted. There is a medium amount of serosanguineous drainage noted. The wound margin is distinct with the  outline attached to the wound base. There is small (1-33%) pink granulation within the wound bed. There is a large (67- 100%) amount of necrotic tissue within the wound bed including Adherent Slough. The periwound skin appearance had no abnormalities noted for moisture. The periwound skin appearance had no abnormalities noted for color. The periwound skin appearance exhibited: Scarring. Periwound temperature was noted as No Abnormality. 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 -  Dr. Celine Ahr - room 3 Cellular or Tissue Based Products: Other Cellular or Tissue Based Products Orders/Instructions: - Theraskin applied by Psychiatric nurse, leave on until further notice Bathing/ Shower/ Hygiene: Do not shower or bathe in tub. Edema Control - Lymphedema / SCD / Other: Avoid standing for long periods of time. Exercise regularly Additional Orders / Instructions: Follow Nutritious Diet WOUND #17: - Lower Leg Wound Laterality: Right, Lateral Cleanser: Soap and Water Every Other Day/30 Days Discharge Instructions: May shower and wash wound with dial antibacterial soap and water prior to dressing change. Cleanser: Wound Cleanser Every Other Day/30 Days Discharge Instructions: Cleanse the wound with wound cleanser prior to applying a clean dressing using gauze sponges, not tissue or cotton balls. Peri-Wound Care: Triamcinolone 15 (g) Every Other Day/30 Days Discharge Instructions: Use triamcinolone 15 (g) as directed Peri-Wound Care: Sween Lotion (Moisturizing lotion) Every Other Day/30 Days Discharge Instructions: Apply moisturizing lotion as directed Prim Dressing: ADAPTIC TOUCH 3x4.25 (in/in) Every Other Day/30 Days ary Discharge Instructions: Apply to wound bed as instructed Secondary Dressing: ABD Pad, 5x9 Every Other  Day/30 Days Discharge Instructions: Apply over primary dressing as directed. Secured With: Elastic Bandage 4 inch (ACE bandage) Every Other Day/30 Days Discharge Instructions: Secure with ACE bandage as directed. Com pression Wrap: Kerlix Roll 4.5x3.1 (in/yd) Every Other Day/30 Days Discharge Instructions: Apply Kerlix and Coban compression as directed. WOUND #21: - Ankle Wound Laterality: Right, Medial Cleanser: Soap and Water Every Other Day/30 Days Discharge Instructions: May shower and wash wound with dial antibacterial soap and water prior to dressing change. Cleanser: Wound Cleanser Every Other Day/30 Days Discharge Instructions: Cleanse the wound with wound cleanser prior to applying a clean dressing using gauze sponges, not tissue or cotton balls. Peri-Wound Care: Triamcinolone 15 (g) Every Other Day/30 Days Discharge Instructions: Use triamcinolone 15 (g) as directed Peri-Wound Care: Sween Lotion (Moisturizing lotion) Every Other Day/30 Days Discharge Instructions: Apply moisturizing lotion as directed Prim Dressing: ADAPTIC TOUCH 3x4.25 (in/in) Every Other Day/30 Days ary Discharge Instructions: Apply to wound bed as instructed Secondary Dressing: ABD Pad, 5x9 Every Other Day/30 Days Discharge Instructions: Apply over primary dressing as directed. Secured With: Elastic Bandage 4 inch (ACE bandage) Every Other Day/30 Days Discharge Instructions: Secure with ACE bandage as directed. Com pression Wrap: Kerlix Roll 4.5x3.1 (in/yd) Every Other Day/30 Days Discharge Instructions: Apply Kerlix and Coban compression as directed. 05/30/2022: The patient underwent surgical debridement and placement of TheraSkin to her wounds on January 5. She has been doing well and does not endorse any pain. The TheraSkin is intact and looks beautiful. It is sutured in place. There is no exudate or significant drainage. [name], [name] ([id number]) [id number] Page 12 of 14 I  left this TheraSkin in place and did not perform any debridement. She will continue to care for it as recommended by Dr. Jaclynn Guarneri. Follow-up in 1 week to continue to monitor the wounds. Electronic Signature(s) Signed: 05/30/2022 10:50:41 AM By: Fredirick Maudlin MD FACS Entered By: Fredirick Maudlin on 05/30/2022 10:50:41 -------------------------------------------------------------------------------- HxROS Details Patient Name: Date of Service: [name], [name] 05/30/2022 10:15 A M Medical Record Number: [id number] Patient Account Number: [id number] Date of Birth/Sex: Treating RN: 1971-03-24 (52 y.o. F) Primary Care Provider: [name] Other Clinician: Referring Provider: Treating Provider/Extender: Elyse Jarvis Weeks in Treatment: 74 Information Obtained From Patient Constitutional Symptoms (General Health) Medical History: Past Medical History Notes: H/O miscarriage Eyes Medical History: Negative for: Cataracts; Glaucoma; Optic Neuritis Ear/Nose/Mouth/Throat Medical History: Negative for: Chronic sinus  problems/congestion; Middle ear problems Hematologic/Lymphatic Medical History: Positive for: Anemia; Sickle Cell Disease Negative for: Hemophilia; Human Immunodeficiency Virus; Lymphedema Respiratory Medical History: Negative for: Aspiration; Asthma; Chronic Obstructive Pulmonary Disease (COPD); Pneumothorax; Sleep Apnea; Tuberculosis Cardiovascular Medical History: Negative for: Angina; Arrhythmia; Congestive Heart Failure; Coronary Artery Disease; Deep Vein Thrombosis; Hypertension; Hypotension; Myocardial Infarction; Peripheral Arterial Disease; Peripheral Venous Disease; Phlebitis; Vasculitis Past Medical History Notes: bradycardia Gastrointestinal Medical History: Negative for: Cirrhosis ; Colitis; Crohns; Hepatitis A; Hepatitis B; Hepatitis C Past Medical History Notes: cholilithiasis Endocrine Medical History: Negative for: Type I  Diabetes; Type II Diabetes [name], [name] ([id number]) [id number] Page 13 of 14 Genitourinary Medical History: Negative for: End Stage Renal Disease Immunological Medical History: Negative for: Lupus Erythematosus; Raynauds; Scleroderma Integumentary (Skin) Medical History: Negative for: History of Burn Musculoskeletal Medical History: Negative for: Gout; Rheumatoid Arthritis; Osteoarthritis; Osteomyelitis Neurologic Medical History: Positive for: Neuropathy - right foot intermittant Negative for: Dementia; Quadriplegia; Paraplegia; Seizure Disorder Oncologic Medical History: Negative for: Received Chemotherapy; Received Radiation Psychiatric Medical History: Negative for: Anorexia/bulimia; Confinement Anxiety Immunizations Pneumococcal Vaccine: Received Pneumococcal Vaccination: No Implantable Devices None Hospitalization / Surgery History Type of Hospitalization/Surgery c section x2 left breast lumpectomy iandD right ankle with theraskin Family and Social History Cancer: No; Diabetes: Yes - Mother; Heart Disease: No; Hereditary Spherocytosis: No; Hypertension: No; Kidney Disease: No; Lung Disease: Yes - Mother; Seizures: No; Stroke: No; Thyroid Problems: No; Tuberculosis: No; Never smoker; Marital Status - Married; Alcohol Use: Never; Drug Use: No History; Caffeine Use: Daily; Financial Concerns: No; Food, Clothing or Shelter Needs: No; Support System Lacking: No; Transportation Concerns: No Electronic Signature(s) Signed: 05/30/2022 11:14:46 AM By: Fredirick Maudlin MD FACS Entered By: Fredirick Maudlin on 05/30/2022 10:46:15 -------------------------------------------------------------------------------- SuperBill Details Patient Name: Date of Service: [name], [name] 05/30/2022 Medical Record Number: [id number] Patient Account Number: [id number] Date of Birth/Sex: Treating RN: 09-24-1970 (52 y.o. 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

## 2022-05-31 NOTE — Progress Notes (Signed)
[name], [name] ([id number]) [id number] Page 1 of 10 Visit Report for 05/30/2022 Arrival Information Details Patient Name: Date of Service: [name] [name] 05/30/2022 10:15 A M Medical Record Number: [id number] Patient Account Number: [id number] Date of Birth/Sex: Treating RN: 05/23/1970 (52 y.o. [name] Primary Care Colie Josten: [name] Other Clinician: Referring Kailo Kosik: Treating Frantz Quattrone/Extender: Elyse Jarvis Weeks in Treatment: 14 Visit Information History Since Last Visit Added or deleted any medications: No Patient Arrived: [name] Any new allergies or adverse reactions: No Arrival Time: 10:24 Had a fall or experienced change in No Accompanied By: self activities of daily living that may affect Transfer Assistance: None risk of falls: Patient Identification Verified: Yes Signs or symptoms of abuse/neglect since last visito No Secondary Verification Process Completed: Yes Hospitalized since last visit: No Patient Requires Transmission-Based Precautions: No Implantable device outside of the clinic excluding No Patient Has Alerts: No cellular tissue based products placed in the center since last visit: Has Dressing in Place as Prescribed: Yes Pain Present Now: Yes Electronic Signature(s) Signed: 05/30/2022 3:59:24 PM By: Adline Peals Entered By: Adline Peals on 05/30/2022 10:26:23 -------------------------------------------------------------------------------- Clinic Level of Care Assessment Details Patient Name: Date of Service: [name] [name] 05/30/2022 10:15 A M Medical Record Number: [id number] Patient Account Number: [id number] Date of Birth/Sex: Treating RN: 1971/03/22 (52 y.o. [name] Primary Care Jameca Chumley: [name] Other Clinician: Referring Jere Bostrom: Treating Jamiah Recore/Extender: Elyse Jarvis Weeks in Treatment: 35 Clinic Level of Care  Assessment Items TOOL 4 Quantity Score X- 1 0 Use when only an EandM is performed on FOLLOW-UP visit ASSESSMENTS - Nursing Assessment / Reassessment X- 1 10 Reassessment of Co-morbidities (includes updates in patient status) X- 1 5 Reassessment of Adherence to Treatment Plan ASSESSMENTS - Wound and Skin A ssessment / Reassessment '[]'$  - 0 Simple Wound Assessment / Reassessment - one wound X- 2 5 Complex Wound Assessment / Reassessment - multiple wounds '[]'$  - 0 Dermatologic / Skin Assessment (not related to wound area) ASSESSMENTS - Focused Assessment X- 1 5 Circumferential Edema Measurements - multi extremities '[]'$  - 0 Nutritional Assessment / Counseling / Intervention [name] ([id number]) [id number] Page 2 of 10 X- 1 5 Lower Extremity Assessment (monofilament, tuning fork, pulses) '[]'$  - 0 Peripheral Arterial Disease Assessment (using hand held doppler) ASSESSMENTS - Ostomy and/or Continence Assessment and Care '[]'$  - 0 Incontinence Assessment and Management '[]'$  - 0 Ostomy Care Assessment and Management (repouching, etc.) PROCESS - Coordination of Care X - Simple Patient / Family Education for ongoing care 1 15 '[]'$  - 0 Complex (extensive) Patient / Family Education for ongoing care X- 1 10 Staff obtains Programmer, systems, Records, T Results / Process Orders est '[]'$  - 0 Staff telephones HHA, Nursing Homes / Clarify orders / etc '[]'$  - 0 Routine Transfer to another Facility (non-emergent condition) '[]'$  - 0 Routine Hospital Admission (non-emergent condition) '[]'$  - 0 New Admissions / Biomedical engineer / Ordering NPWT Apligraf, etc. , '[]'$  - 0 Emergency Hospital Admission (emergent condition) X- 1 10 Simple Discharge Coordination '[]'$  - 0 Complex (extensive) Discharge Coordination PROCESS - Special Needs '[]'$  - 0 Pediatric / Minor Patient Management '[]'$  - 0 Isolation Patient Management '[]'$  - 0 Hearing / Language / Visual special needs '[]'$  -  0 Assessment of Community assistance (transportation, D/C planning, etc.) '[]'$  - 0 Additional assistance / Altered mentation '[]'$  - 0 Support Surface(s) Assessment (bed, cushion, seat, etc.) INTERVENTIONS - Wound Cleansing / Measurement '[]'$  - 0 Simple Wound Cleansing -  one wound X- 2 5 Complex Wound Cleansing - multiple wounds X- 1 5 Wound Imaging (photographs - any number of wounds) '[]'$  - 0 Wound Tracing (instead of photographs) '[]'$  - 0 Simple Wound Measurement - one wound X- 2 5 Complex Wound Measurement - multiple wounds INTERVENTIONS - Wound Dressings X - Small Wound Dressing one or multiple wounds 1 10 '[]'$  - 0 Medium Wound Dressing one or multiple wounds '[]'$  - 0 Large Wound Dressing one or multiple wounds '[]'$  - 0 Application of Medications - topical '[]'$  - 0 Application of Medications - injection INTERVENTIONS - Miscellaneous '[]'$  - 0 External ear exam '[]'$  - 0 Specimen Collection (cultures, biopsies, blood, body fluids, etc.) '[]'$  - 0 Specimen(s) / Culture(s) sent or taken to Lab for analysis '[]'$  - 0 Patient Transfer (multiple staff / Civil Service fast streamer / Similar devices) '[]'$  - 0 Simple Staple / Suture removal (25 or less) '[]'$  - 0 Complex Staple / Suture removal (26 or more) '[]'$  - 0 Hypo / Hyperglycemic Management (close monitor of Blood Glucose) [name], [name] ([id number]) [id number] Page 3 of 10 '[]'$  - 0 Ankle / Brachial Index ([name]) - do not check if billed separately X- 1 5 Vital Signs Has the patient been seen at the hospital within the last three years: Yes Total Score: 110 Level Of Care: New/Established - Level 3 Electronic Signature(s) Signed: 05/30/2022 3:59:24 PM By: Adline Peals Entered By: Adline Peals on 05/30/2022 10:48:24 -------------------------------------------------------------------------------- Encounter Discharge Information Details Patient Name: Date of Service: [name], [name] 05/30/2022 10:15 A M Medical Record Number:  [id number] Patient Account Number: [id number] Date of Birth/Sex: Treating RN: Oct 05, 1970 (52 y.o. [name] Primary Care Myesha Stillion: [name] Other Clinician: Referring Emillia Weatherly: Treating Shandelle Borrelli/Extender: Elyse Jarvis Weeks in Treatment: 32 Encounter Discharge Information Items Discharge Condition: Stable Ambulatory Status: Cane Discharge Destination: Home Transportation: Private Auto Accompanied By: self Schedule Follow-up Appointment: Yes Clinical Summary of Care: Patient Declined Electronic Signature(s) Signed: 05/30/2022 3:59:24 PM By: Adline Peals Entered By: Adline Peals on 05/30/2022 10:49:12 -------------------------------------------------------------------------------- Lower Extremity Assessment Details Patient Name: Date of Service: [name], [name] 05/30/2022 10:15 A M Medical Record Number: [id number] Patient Account Number: [id number] Date of Birth/Sex: Treating RN: 1970-11-19 (52 y.o. [name] Primary Care Jahnia Hewes: [name] Other Clinician: Referring Aaran Enberg: Treating Aylen Stradford/Extender: Elyse Jarvis Weeks in Treatment: 58 Edema Assessment Assessed: [Left: No] [Right: No] Edema: [Left: Ye] [Right: s] Calf Left: Right: Point of Measurement: 33 cm From Medial Instep 28 cm 29.5 cm Ankle Left: Right: Point of Measurement: 10 cm From Medial Instep 18.9 cm 19.5 cm Vascular Assessment Woodhead, Dorisann ([id number]) [Right:[id number] Page 4 of 10] Pulses: Dorsalis Pedis Palpable: [Right:Yes] Electronic Signature(s) Signed: 05/30/2022 3:59:24 PM By: Adline Peals Entered By: Adline Peals on 05/30/2022 10:30:07 -------------------------------------------------------------------------------- Multi Wound Chart Details Patient Name: Date of Service: [name], [name] 05/30/2022 10:15 A M Medical Record Number: [id number] Patient Account  Number: [id number] Date of Birth/Sex: Treating RN: 09/10/1970 (52 y.o. F) Primary Care Salote Weidmann: [name] Other Clinician: Referring Trecia Maring: Treating Perle Brickhouse/Extender: Elyse Jarvis Weeks in Treatment: 38 Vital Signs Height(in): 83 Pulse(bpm): 30 Weight(lbs): Blood Pressure(mmHg): 162/73 Body Mass Index(BMI): Temperature(F): 98.1 Respiratory Rate(breaths/min): 16 [17:Photos:] [[name]/A:[name]/A] Right, Lateral Lower Leg Right, Medial Ankle [name]/A Wound Location: Gradually Appeared Gradually Appeared [name]/A Wounding Event: Sickle Cell Lesion Sickle Cell Lesion [name]/A Primary Etiology: [name]/A Venous Leg Ulcer [name]/A Secondary Etiology: Anemia, Sickle Cell Disease, Anemia, Sickle Cell Disease, [name]/A Comorbid History:  Neuropathy Neuropathy 10/05/2012 06/26/2021 [name]/A Date Acquired: 56 48 [name]/A Weeks of Treatment: Open Open [name]/A Wound Status: No No [name]/A Wound Recurrence: Yes No [name]/A Clustered Wound: 6.6x5.2x0.1 8.3x3.1x0.1 [name]/A Measurements L x W x D (cm) 26.955 20.208 [name]/A A (cm) : rea 2.695 2.021 [name]/A Volume (cm) : 85.70% 30.30% [name]/A % Reduction in Area: 85.70% 30.30% [name]/A % Reduction in Volume: Full Thickness Without Exposed Full Thickness Without Exposed [name]/A Classification: Support Structures Support Structures Medium Medium [name]/A Exudate Amount: Serosanguineous Serosanguineous [name]/A Exudate Type: red, brown red, brown [name]/A Exudate Color: Distinct, outline attached Distinct, outline attached [name]/A Wound Margin: Small (1-33%) Small (1-33%) [name]/A Granulation Amount: Pink, Pale Pink [name]/A Granulation Quality: Large (67-100%) Large (67-100%) [name]/A Necrotic Amount: Fat Layer (Subcutaneous Tissue): Yes Fat Layer (Subcutaneous Tissue): Yes [name]/A Exposed Structures: Fascia: No Fascia: No Tendon: No Tendon: No Muscle: No Muscle: No Joint: No Joint: No Bone: No Bone: No Small (1-33%) Small (1-33%) [name]/A Epithelialization: Scarring: Yes Scarring: Yes [name]/A Periwound Skin  Texture: No Abnormalities Noted No Abnormalities Noted [name]/A Periwound Skin Moisture: No Abnormalities Noted No Abnormalities Noted [name]/A Periwound Skin ColorEula [name] ([id number]) [id number] Page 5 of 10 No Abnormality No Abnormality [name]/A Temperature: Treatment Notes Electronic Signature(s) Signed: 05/30/2022 10:44:00 AM By: Fredirick Maudlin MD FACS Entered By: Fredirick Maudlin on 05/30/2022 10:43:59 -------------------------------------------------------------------------------- Multi-Disciplinary Care Plan Details Patient Name: Date of Service: [name], [name] 05/30/2022 10:15 A M Medical Record Number: [id number] Patient Account Number: [id number] Date of Birth/Sex: Treating RN: 07-17-1970 (51 y.o. [name] Primary Care Shawn Carattini: [name] Other Clinician: Referring Sareena Odeh: Treating Tarica Harl/Extender: Elyse Jarvis Weeks in Treatment: 7 Multidisciplinary Care Plan reviewed with physician Active Inactive Venous Leg Ulcer Nursing Diagnoses: Actual venous Insuffiency (use after diagnosis is confirmed) Knowledge deficit related to disease process and management Goals: Patient will maintain optimal edema control Date Initiated: 06/26/2021 Target Resolution Date: 06/21/2022 Goal Status: Active Interventions: Assess peripheral edema status every visit. Compression as ordered Treatment Activities: Therapeutic compression applied : 06/26/2021 Notes: Wound/Skin Impairment Nursing Diagnoses: Impaired tissue integrity Goals: Patient/caregiver will verbalize understanding of skin care regimen Date Initiated: 04/17/2021 Target Resolution Date: 06/21/2022 Goal Status: Active Ulcer/skin breakdown will have a volume reduction of 30% by week 4 Date Initiated: 04/17/2021 Date Inactivated: 05/29/2021 Target Resolution Date: 05/15/2021 Goal Status: Met Ulcer/skin breakdown will have a volume reduction of 50% by week  8 Date Initiated: 05/29/2021 Date Inactivated: 06/26/2021 Target Resolution Date: 06/26/2021 Goal Status: Unmet Unmet Reason: venous reflux Interventions: Assess patient/caregiver ability to obtain necessary supplies Assess patient/caregiver ability to perform ulcer/skin care regimen upon admission and as needed Assess ulceration(s) every visit Provide education on ulcer and skin care Treatment Activities: Topical wound management initiated : 04/17/2021 [name] ([id number]) [phone].pdf Page 6 of 10 Notes: 06/08/21: Left leg wounds greater than 30% volume reduction, right leg acute infection. Electronic Signature(s) Signed: 05/30/2022 3:59:24 PM By: Adline Peals Entered By: Adline Peals on 05/30/2022 10:47:00 -------------------------------------------------------------------------------- Pain Assessment Details Patient Name: Date of Service: [name], [name] 05/30/2022 10:15 A M Medical Record Number: [id number] Patient Account Number: [id number] Date of Birth/Sex: Treating RN: August 12, 1970 (52 y.o. [name] Primary Care Zeke Aker: [name] Other Clinician: Referring Nael Petrosyan: Treating Anallely Rosell/Extender: Elyse Jarvis Weeks in Treatment: 45 Active Problems Location of Pain Severity and Description of Pain Patient Has Paino Yes Site Locations Pain Location: Pain in Ulcers Duration of the Pain. Constant / Intermittento Intermittent Rate the pain. Current Pain Level: 2 Pain  Management and Medication Current Pain Management: Electronic Signature(s) Signed: 05/30/2022 3:59:24 PM By: Adline Peals Entered By: Adline Peals on 05/30/2022 10:26:49 -------------------------------------------------------------------------------- Patient/Caregiver Education Details Patient Name: Date of Service: [name], [name] 1/11/2024andnbsp10:15 [place] Record Number: [id number] Patient Account Number:  [id number] Date of Birth/Gender: Treating RN: [birth year] (52 y.o. [name] Primary Care Physician: [name] Other Clinician: Referring Physician: Treating Physician/Extender: Elyse Jarvis Weeks in Treatment: [address] [place], [name] ([id number]) [id number] Page 7 of 10 Education Provided To: Patient Education Topics Provided Wound/Skin Impairment: Methods: Explain/Verbal Responses: Reinforcements needed, State content correctly Electronic Signature(s) Signed: 05/30/2022 3:59:24 PM By: Adline Peals Entered By: Adline Peals on 05/30/2022 10:47:23 -------------------------------------------------------------------------------- Wound Assessment Details Patient Name: Date of Service: [name], [name] 05/30/2022 10:15 A M Medical Record Number: [id number] Patient Account Number: [id number] Date of Birth/Sex: Treating RN: October 25, 1970 (52 y.o. [name] Primary Care Maggy Wyble: [name] Other Clinician: Referring Dian Laprade: Treating Idrees Quam/Extender: Elyse Jarvis Weeks in Treatment: 22 Wound Status Wound Number: 17 Primary Etiology: Sickle Cell Lesion Wound Location: Right, Lateral Lower Leg Wound Status: Open Wounding Event: Gradually Appeared Comorbid History: Anemia, Sickle Cell Disease, Neuropathy Date Acquired: 10/05/2012 Weeks Of Treatment: 58 Clustered Wound: Yes Photos Wound Measurements Length: (cm) 6.6 Width: (cm) 5.2 Depth: (cm) 0.1 Area: (cm) 26.955 Volume: (cm) 2.695 % Reduction in Area: 85.7% % Reduction in Volume: 85.7% Epithelialization: Small (1-33%) Tunneling: No Undermining: No Wound Description Classification: Full Thickness Without Exposed Support Structures Wound Margin: Distinct, outline attached Exudate Amount: Medium Exudate Type: Serosanguineous Exudate Color: red, brown Foul Odor After Cleansing: No Slough/Fibrino  Yes Wound Bed Granulation Amount: Small (1-33%) Exposed Structure Granulation Quality: Pink, Pale Fascia Exposed: No Necrotic Amount: Large (67-100%) Fat Layer (Subcutaneous Tissue) Exposed: Yes Necrotic Quality: Adherent Slough Tendon Exposed: No Muscle Exposed: No [name], [name] ([id number]) [id number] Page 8 of 10 Joint Exposed: No Bone Exposed: No Periwound Skin Texture Texture Color No Abnormalities Noted: No No Abnormalities Noted: Yes Scarring: Yes Temperature / Pain Temperature: No Abnormality Moisture No Abnormalities Noted: Yes Treatment Notes Wound #17 (Lower Leg) Wound Laterality: Right, Lateral Cleanser Soap and Water Discharge Instruction: May shower and wash wound with dial antibacterial soap and water prior to dressing change. Wound Cleanser Discharge Instruction: Cleanse the wound with wound cleanser prior to applying a clean dressing using gauze sponges, not tissue or cotton balls. Peri-Wound Care Triamcinolone 15 (g) Discharge Instruction: Use triamcinolone 15 (g) as directed Sween Lotion (Moisturizing lotion) Discharge Instruction: Apply moisturizing lotion as directed Topical Primary Dressing ADAPTIC TOUCH 3x4.25 (in/in) Discharge Instruction: Apply to wound bed as instructed Secondary Dressing ABD Pad, 5x9 Discharge Instruction: Apply over primary dressing as directed. Secured With Elastic Bandage 4 inch (ACE bandage) Discharge Instruction: Secure with ACE bandage as directed. Compression Wrap Kerlix Roll 4.5x3.1 (in/yd) Discharge Instruction: Apply Kerlix and Coban compression as directed. Compression Stockings Add-Ons Electronic Signature(s) Signed: 05/30/2022 3:59:24 PM By: Adline Peals Entered By: Adline Peals on 05/30/2022 10:32:51 -------------------------------------------------------------------------------- Wound Assessment Details Patient Name: Date of Service: [name], [name] 05/30/2022  10:15 A M Medical Record Number: [id number] Patient Account Number: [id number] Date of Birth/Sex: Treating RN: 02/21/1971 (52 y.o. [name] Primary Care Veronica Guerrant: [name] Other Clinician: Referring Trameka Dorough: Treating Jenisse Vullo/Extender: Elyse Jarvis Weeks in Treatment: 88 Wound Status Wound Number: 21 Primary Etiology: Sickle Cell Lesion Wound Location: Right, Medial Ankle Secondary Etiology: Venous Leg Ulcer Wounding Event: Gradually Appeared Wound Status: Open  Date Acquired: 06/26/2021 Comorbid History: Anemia, Sickle Cell Disease, Neuropathy [name], [name] ([id number]) [id number] Page 9 of 10 Weeks Of Treatment: 48 Clustered Wound: No Photos Wound Measurements Length: (cm) 8.3 Width: (cm) 3.1 Depth: (cm) 0.1 Area: (cm) 20.208 Volume: (cm) 2.021 % Reduction in Area: 30.3% % Reduction in Volume: 30.3% Epithelialization: Small (1-33%) Tunneling: No Undermining: No Wound Description Classification: Full Thickness Without Exposed Support Structures Wound Margin: Distinct, outline attached Exudate Amount: Medium Exudate Type: Serosanguineous Exudate Color: red, brown Foul Odor After Cleansing: No Slough/Fibrino Yes Wound Bed Granulation Amount: Small (1-33%) Exposed Structure Granulation Quality: Pink Fascia Exposed: No Necrotic Amount: Large (67-100%) Fat Layer (Subcutaneous Tissue) Exposed: Yes Necrotic Quality: Adherent Slough Tendon Exposed: No Muscle Exposed: No Joint Exposed: No Bone Exposed: No Periwound Skin Texture Texture Color No Abnormalities Noted: No No Abnormalities Noted: Yes Scarring: Yes Temperature / Pain Temperature: No Abnormality Moisture No Abnormalities Noted: Yes Treatment Notes Wound #21 (Ankle) Wound Laterality: Right, Medial Cleanser Soap and Water Discharge Instruction: May shower and wash wound with dial antibacterial soap and water prior to dressing  change. Wound Cleanser Discharge Instruction: Cleanse the wound with wound cleanser prior to applying a clean dressing using gauze sponges, not tissue or cotton balls. Peri-Wound Care Triamcinolone 15 (g) Discharge Instruction: Use triamcinolone 15 (g) as directed Sween Lotion (Moisturizing lotion) Discharge Instruction: Apply moisturizing lotion as directed Topical Primary Dressing ADAPTIC TOUCH 3x4.25 (in/in) Discharge Instruction: Apply to wound bed as instructed Secondary Dressing ABD Pad, 5x9 Discharge Instruction: Apply over primary dressing as directed. [name], [name] ([id number]) [id number] Page 10 of 10 Secured With Elastic Bandage 4 inch (ACE bandage) Discharge Instruction: Secure with ACE bandage as directed. Compression Wrap Kerlix Roll 4.5x3.1 (in/yd) Discharge Instruction: Apply Kerlix and Coban compression as directed. Compression Stockings Add-Ons Electronic Signature(s) Signed: 05/30/2022 3:59:24 PM By: Adline Peals Entered By: Adline Peals on 05/30/2022 10:33:09 -------------------------------------------------------------------------------- Vitals Details Patient Name: Date of Service: [name], [name] 05/30/2022 10:15 A M Medical Record Number: [id number] Patient Account Number: [id number] Date of Birth/Sex: Treating RN: [birth year] (52 y.o. [name] Primary Care Sama Arauz: [name] Other Clinician: Referring Deadrick Stidd: Treating Avrey Flanagin/Extender: Elyse Jarvis Weeks in Treatment: 20 Vital Signs Time Taken: 10:26 Temperature (F): 98.1 Height (in): 67 Pulse (bpm): 85 Respiratory Rate (breaths/min): 16 Blood Pressure (mmHg): 162/73 Reference Range: 80 - 120 mg / dl Electronic Signature(s) Signed: 05/30/2022 3:59:24 PM By: Adline Peals Entered By: Adline Peals on 05/30/2022 10:26:39

## 2022-06-04 ENCOUNTER — Encounter (HOSPITAL_BASED_OUTPATIENT_CLINIC_OR_DEPARTMENT_OTHER): Payer: Medicaid Other | Admitting: General Surgery

## 2022-06-04 DIAGNOSIS — L97818 Non-pressure chronic ulcer of other part of right lower leg with other specified severity: Secondary | ICD-10-CM | POA: Diagnosis not present

## 2022-06-04 NOTE — Progress Notes (Signed)
[name], [name] ([id number]) [id number] Page 1 of 11 Visit Report for 06/04/2022 Arrival Information Details Patient Name: Date of Service: [name] [name] 06/04/2022 9:45 A M Medical Record Number: [id number] Patient Account Number: [id number] Date of Birth/Sex: Treating RN: [birth year] (52 y.o. [name], [name] Primary Care Shaneta Cervenka: [name] Other Clinician: Referring Swain Acree: Treating Nickolis Diel/Extender: Elyse Jarvis Weeks in Treatment: 81 Visit Information History Since Last Visit Added or deleted any medications: No Patient Arrived: [name] Any new allergies or adverse reactions: No Arrival Time: 10:18 Had a fall or experienced change in No Accompanied By: SELF activities of daily living that may affect Transfer Assistance: None risk of falls: Patient Requires Transmission-Based Precautions: No Signs or symptoms of abuse/neglect since last visito No Patient Has Alerts: No Hospitalized since last visit: No Implantable device outside of the clinic excluding No cellular tissue based products placed in the center since last visit: Has Dressing in Place as Prescribed: Yes Pain Present Now: No Electronic Signature(s) Signed: 06/04/2022 3:04:08 PM By: Blanche East RN Entered By: Blanche East on 06/04/2022 10:20:09 -------------------------------------------------------------------------------- Clinic Level of Care Assessment Details Patient Name: Date of Service: [name] [name] 06/04/2022 9:45 A M Medical Record Number: [id number] Patient Account Number: [id number] Date of Birth/Sex: Treating RN: November 14, 1970 (52 y.o. [name] Primary Care Keyanni Whittinghill: [name] Other Clinician: Referring Lynette Topete: Treating Sukari Grist/Extender: Elyse Jarvis Weeks in Treatment: 65 Clinic Level of Care Assessment Items TOOL 4 Quantity Score X- 1 0 Use when only an EandM is performed on FOLLOW-UP  visit ASSESSMENTS - Nursing Assessment / Reassessment X- 1 10 Reassessment of Co-morbidities (includes updates in patient status) X- 1 5 Reassessment of Adherence to Treatment Plan ASSESSMENTS - Wound and Skin A ssessment / Reassessment X - Simple Wound Assessment / Reassessment - one wound 1 5 '[]'$  - 0 Complex Wound Assessment / Reassessment - multiple wounds '[]'$  - 0 Dermatologic / Skin Assessment (not related to wound area) ASSESSMENTS - Focused Assessment X- 1 5 Circumferential Edema Measurements - multi extremities '[]'$  - 0 Nutritional Assessment / Counseling / Intervention [name] ([id number]) [id number] Page 2 of 11 '[]'$  - 0 Lower Extremity Assessment (monofilament, tuning fork, pulses) '[]'$  - 0 Peripheral Arterial Disease Assessment (using hand held doppler) ASSESSMENTS - Ostomy and/or Continence Assessment and Care '[]'$  - 0 Incontinence Assessment and Management '[]'$  - 0 Ostomy Care Assessment and Management (repouching, etc.) PROCESS - Coordination of Care X - Simple Patient / Family Education for ongoing care 1 15 '[]'$  - 0 Complex (extensive) Patient / Family Education for ongoing care X- 1 10 Staff obtains Programmer, systems, Records, T Results / Process Orders est '[]'$  - 0 Staff telephones HHA, Nursing Homes / Clarify orders / etc '[]'$  - 0 Routine Transfer to another Facility (non-emergent condition) '[]'$  - 0 Routine Hospital Admission (non-emergent condition) '[]'$  - 0 New Admissions / Biomedical engineer / Ordering NPWT Apligraf, etc. , '[]'$  - 0 Emergency Hospital Admission (emergent condition) '[]'$  - 0 Simple Discharge Coordination '[]'$  - 0 Complex (extensive) Discharge Coordination PROCESS - Special Needs '[]'$  - 0 Pediatric / Minor Patient Management '[]'$  - 0 Isolation Patient Management '[]'$  - 0 Hearing / Language / Visual special needs '[]'$  - 0 Assessment of Community assistance (transportation, D/C planning, etc.) '[]'$  - 0 Additional assistance /  Altered mentation '[]'$  - 0 Support Surface(s) Assessment (bed, cushion, seat, etc.) INTERVENTIONS - Wound Cleansing / Measurement X - Simple Wound Cleansing - one wound 1 5 '[]'$  - 0  Complex Wound Cleansing - multiple wounds '[]'$  - 0 Wound Imaging (photographs - any number of wounds) '[]'$  - 0 Wound Tracing (instead of photographs) '[]'$  - 0 Simple Wound Measurement - one wound '[]'$  - 0 Complex Wound Measurement - multiple wounds INTERVENTIONS - Wound Dressings '[]'$  - 0 Small Wound Dressing one or multiple wounds X- 2 15 Medium Wound Dressing one or multiple wounds '[]'$  - 0 Large Wound Dressing one or multiple wounds '[]'$  - 0 Application of Medications - topical '[]'$  - 0 Application of Medications - injection INTERVENTIONS - Miscellaneous '[]'$  - 0 External ear exam '[]'$  - 0 Specimen Collection (cultures, biopsies, blood, body fluids, etc.) '[]'$  - 0 Specimen(s) / Culture(s) sent or taken to Lab for analysis '[]'$  - 0 Patient Transfer (multiple staff / Civil Service fast streamer / Similar devices) '[]'$  - 0 Simple Staple / Suture removal (25 or less) '[]'$  - 0 Complex Staple / Suture removal (26 or more) '[]'$  - 0 Hypo / Hyperglycemic Management (close monitor of Blood Glucose) Muegge, Nitara ([id number]) [id number] Page 3 of 11 '[]'$  - 0 Ankle / Brachial Index ([name]) - do not check if billed separately X- 1 5 Vital Signs Has the patient been seen at the hospital within the last three years: Yes Total Score: 90 Level Of Care: New/Established - Level 3 Electronic Signature(s) Signed: 06/04/2022 3:04:08 PM By: Blanche East RN Entered By: Blanche East on 06/04/2022 12:10:27 -------------------------------------------------------------------------------- Encounter Discharge Information Details Patient Name: Date of Service: [name], [name] 06/04/2022 9:45 A M Medical Record Number: [id number] Patient Account Number: [id number] Date of Birth/Sex: Treating RN: [birth year] (52 y.o. [name] Primary Care Geniva Lohnes: [name] Other Clinician: Referring [name]: Treating [name]/Extender: Elyse Jarvis Weeks in Treatment: 97 Encounter Discharge Information Items Discharge Condition: Stable Ambulatory Status: Cane Discharge Destination: Home Transportation: Private Auto Accompanied By: self Schedule Follow-up Appointment: Yes Clinical Summary of Care: Electronic Signature(s) Signed: 06/04/2022 10:49:16 AM By: Blanche East RN Entered By: Blanche East on 06/04/2022 10:49:16 -------------------------------------------------------------------------------- Lower Extremity Assessment Details Patient Name: Date of Service: [name], [place] [name] 06/04/2022 9:45 A M Medical Record Number: [id number] Patient Account Number: [id number] Date of Birth/Sex: Treating RN: 30-Aug-1970 (52 y.o. [name] Primary Care Jaimeson Gopal: [name] Other Clinician: Referring Damiano Stamper: Treating Amadea Keagy/Extender: Elyse Jarvis Weeks in Treatment: 59 Edema Assessment Assessed: [Left: No] [Right: No] Edema: [Left: Ye] [Right: s] Calf Left: Right: Point of Measurement: 33 cm From Medial Instep 31.5 cm Ankle Left: Right: Point of Measurement: 10 cm From Medial Instep 19.5 cm Vascular Assessment Fetters, Yuette ([id number]) [Right:[id number] Page 4 of 11] Pulses: Dorsalis Pedis Palpable: [Right:Yes] Electronic Signature(s) Signed: 06/04/2022 3:04:08 PM By: Blanche East RN Entered By: Blanche East on 06/04/2022 10:23:25 -------------------------------------------------------------------------------- Multi Wound Chart Details Patient Name: Date of Service: [name], [name] 06/04/2022 9:45 A M Medical Record Number: [id number] Patient Account Number: [id number] Date of Birth/Sex: Treating RN: [birth year] (52 y.o. F) Primary Care Xin Klawitter: [name] Other Clinician: Referring Shuntia Exton: Treating  Daisee Centner/Extender: Elyse Jarvis Weeks in Treatment: 40 Vital Signs Height(in): 56 Pulse(bpm): 98 Weight(lbs): Blood Pressure(mmHg): 139/66 Body Mass Index(BMI): Temperature(F): 98.1 Respiratory Rate(breaths/min): 16 [17:Photos:] [N/A:N/A] Right, Lateral Lower Leg Right, Medial Ankle N/A Wound Location: Gradually Appeared Gradually Appeared N/A Wounding Event: Sickle Cell Lesion Sickle Cell Lesion N/A Primary Etiology: N/A Venous Leg Ulcer N/A Secondary Etiology: Anemia, Sickle Cell Disease, Anemia, Sickle Cell Disease, N/A Comorbid History: Neuropathy Neuropathy 10/05/2012 06/26/2021 N/A Date Acquired: 59 49  N/A Weeks of Treatment: Open Open N/A Wound Status: No No N/A Wound Recurrence: Yes No N/A Clustered Wound: 6.2x4.9x0.1 8x3x0.1 N/A Measurements L x W x D (cm) 23.86 18.85 N/A A (cm) : rea 2.386 1.885 N/A Volume (cm) : 87.30% 35.00% N/A % Reduction in Area: 87.30% 35.00% N/A % Reduction in Volume: Full Thickness Without Exposed Full Thickness Without Exposed N/A Classification: Support Structures Support Structures Medium Medium N/A Exudate Amount: Serosanguineous Serosanguineous N/A Exudate Type: red, brown red, brown N/A Exudate Color: Distinct, outline attached Distinct, outline attached N/A Wound Margin: Small (1-33%) Small (1-33%) N/A Granulation Amount: Pink, Pale Pink N/A Granulation Quality: Large (67-100%) Large (67-100%) N/A Necrotic Amount: Fat Layer (Subcutaneous Tissue): Yes Fat Layer (Subcutaneous Tissue): Yes N/A Exposed Structures: Fascia: No Fascia: No Tendon: No Tendon: No Muscle: No Muscle: No Joint: No Joint: No Bone: No Bone: No Small (1-33%) Small (1-33%) N/A Epithelialization: Scarring: Yes Scarring: Yes N/A Periwound Skin Texture: No Abnormalities Noted No Abnormalities Noted N/A Periwound Skin Moisture: No Abnormalities Noted No Abnormalities Noted N/A Periwound Skin ColorEula Fried ([id number]) [id number] Page 5 of 11 No Abnormality No Abnormality N/A Temperature: Treatment Notes Wound #17 (Lower Leg) Wound Laterality: Right, Lateral Cleanser Soap and Water Discharge Instruction: May shower and wash wound with dial antibacterial soap and water prior to dressing change. Wound Cleanser Discharge Instruction: Cleanse the wound with wound cleanser prior to applying a clean dressing using gauze sponges, not tissue or cotton balls. Peri-Wound Care Triamcinolone 15 (g) Discharge Instruction: Use triamcinolone 15 (g) as directed Sween Lotion (Moisturizing lotion) Discharge Instruction: Apply moisturizing lotion as directed Topical Primary Dressing ADAPTIC TOUCH 3x4.25 (in/in) Discharge Instruction: Apply to wound bed as instructed Secondary Dressing ABD Pad, 5x9 Discharge Instruction: Apply over primary dressing as directed. Secured With Elastic Bandage 4 inch (ACE bandage) Discharge Instruction: Secure with ACE bandage as directed. Compression Wrap Kerlix Roll 4.5x3.1 (in/yd) Discharge Instruction: Apply Kerlix and Coban compression as directed. Compression Stockings Add-Ons Wound #21 (Ankle) Wound Laterality: Right, Medial Cleanser Soap and Water Discharge Instruction: May shower and wash wound with dial antibacterial soap and water prior to dressing change. Wound Cleanser Discharge Instruction: Cleanse the wound with wound cleanser prior to applying a clean dressing using gauze sponges, not tissue or cotton balls. Peri-Wound Care Triamcinolone 15 (g) Discharge Instruction: Use triamcinolone 15 (g) as directed Sween Lotion (Moisturizing lotion) Discharge Instruction: Apply moisturizing lotion as directed Topical Primary Dressing ADAPTIC TOUCH 3x4.25 (in/in) Discharge Instruction: Apply to wound bed as instructed Secondary Dressing ABD Pad, 5x9 Discharge Instruction: Apply over primary dressing as directed. Secured  With Elastic Bandage 4 inch (ACE bandage) Discharge Instruction: Secure with ACE bandage as directed. Compression Wrap Kerlix Roll 4.5x3.1 (in/yd) Discharge Instruction: Apply Kerlix and Coban compression as directed. Compression Stockings [place], Legrand Rams ([id number]) [id number] Page 6 of 11 Add-Ons Electronic Signature(s) Signed: 06/04/2022 10:52:35 AM By: Fredirick Maudlin MD FACS Entered By: Fredirick Maudlin on 06/04/2022 10:52:34 -------------------------------------------------------------------------------- Multi-Disciplinary Care Plan Details Patient Name: Date of Service: [name] [name], [place] [name] 06/04/2022 9:45 A M Medical Record Number: [id number] Patient Account Number: [id number] Date of Birth/Sex: Treating RN: [birth year] (52 y.o. [name] Primary Care Lotoya Casella: [name] Other Clinician: Referring Elandra Powell: Treating Roxanne Panek/Extender: Elyse Jarvis Weeks in Treatment: 38 Multidisciplinary Care Plan reviewed with physician Active Inactive Venous Leg Ulcer Nursing Diagnoses: Actual venous Insuffiency (use after diagnosis is confirmed) Knowledge deficit related to disease process and management Goals: Patient will maintain optimal edema control Date Initiated: 06/26/2021  Target Resolution Date: 06/21/2022 Goal Status: Active Interventions: Assess peripheral edema status every visit. Compression as ordered Treatment Activities: Therapeutic compression applied : 06/26/2021 Notes: Wound/Skin Impairment Nursing Diagnoses: Impaired tissue integrity Goals: Patient/caregiver will verbalize understanding of skin care regimen Date Initiated: 04/17/2021 Target Resolution Date: 06/21/2022 Goal Status: Active Ulcer/skin breakdown will have a volume reduction of 30% by week 4 Date Initiated: 04/17/2021 Date Inactivated: 05/29/2021 Target Resolution Date: 05/15/2021 Goal Status: Met Ulcer/skin breakdown will have a volume  reduction of 50% by week 8 Date Initiated: 05/29/2021 Date Inactivated: 06/26/2021 Target Resolution Date: 06/26/2021 Goal Status: Unmet Unmet Reason: venous reflux Interventions: Assess patient/caregiver ability to obtain necessary supplies Assess patient/caregiver ability to perform ulcer/skin care regimen upon admission and as needed Assess ulceration(s) every visit Provide education on ulcer and skin care Treatment Activities: Topical wound management initiated : 04/17/2021 Notes: 06/08/21: Left leg wounds greater than 30% volume reduction, right leg acute infection. CYDNIE, DEASON ([id number]) [id number] Page 7 of 11 Electronic Signature(s) Signed: 06/04/2022 10:32:16 AM By: Blanche East RN Entered By: Blanche East on 06/04/2022 10:32:15 -------------------------------------------------------------------------------- Pain Assessment Details Patient Name: Date of Service: [name], [place] [name] 06/04/2022 9:45 A M Medical Record Number: [id number] Patient Account Number: [id number] Date of Birth/Sex: Treating RN: 1970-06-18 (52 y.o. [name] Primary Care Sheri Gatchel: [name] Other Clinician: Referring Raeli Wiens: Treating Trestin Vences/Extender: Elyse Jarvis Weeks in Treatment: 41 Active Problems Location of Pain Severity and Description of Pain Patient Has Paino No Site Locations Rate the pain. Current Pain Level: 0 Pain Management and Medication Current Pain Management: Electronic Signature(s) Signed: 06/04/2022 3:04:08 PM By: Blanche East RN Entered By: Blanche East on 06/04/2022 10:21:00 -------------------------------------------------------------------------------- Patient/Caregiver Education Details Patient Name: Date of Service: [name], [name] 1/16/2024andnbsp9:45 A M Medical Record Number: [id number] Patient Account Number: [id number] Date of Birth/Gender: Treating RN: 06-08-1970 (52 y.o. [name] Primary Care Physician: [name] Other Clinician: Referring Physician: Treating Physician/Extender: Hilarie Fredrickson in Treatment: 89 Education Assessment Education Provided To: YEIMI, DEBNAM ([id number]) [id number] Page 8 of 11 Patient Education Topics Provided Wound/Skin Impairment: Methods: Explain/Verbal Responses: Reinforcements needed, State content correctly Electronic Signature(s) Signed: 06/04/2022 3:04:08 PM By: Blanche East RN Entered By: Blanche East on 06/04/2022 10:32:41 -------------------------------------------------------------------------------- Wound Assessment Details Patient Name: Date of Service: [name], [name] 06/04/2022 9:45 A M Medical Record Number: [id number] Patient Account Number: [id number] Date of Birth/Sex: Treating RN: [birth year] (52 y.o. [name], [name] Primary Care Lorraina Spring: [name] Other Clinician: Referring Ajwa Kimberley: Treating Sriman Tally/Extender: Elyse Jarvis Weeks in Treatment: 76 Wound Status Wound Number: 17 Primary Etiology: Sickle Cell Lesion Wound Location: Right, Lateral Lower Leg Wound Status: Open Wounding Event: Gradually Appeared Comorbid History: Anemia, Sickle Cell Disease, Neuropathy Date Acquired: 10/05/2012 Weeks Of Treatment: 59 Clustered Wound: Yes Photos Wound Measurements Length: (cm) 6.2 Width: (cm) 4.9 Depth: (cm) 0.1 Area: (cm) 23.86 Volume: (cm) 2.386 % Reduction in Area: 87.3% % Reduction in Volume: 87.3% Epithelialization: Small (1-33%) Tunneling: No Undermining: No Wound Description Classification: Full Thickness Without Exposed Support Structures Wound Margin: Distinct, outline attached Exudate Amount: Medium Exudate Type: Serosanguineous Exudate Color: red, brown Foul Odor After Cleansing: No Slough/Fibrino Yes Wound Bed Granulation Amount: Small (1-33%) Exposed Structure Granulation Quality: Pink,  Pale Fascia Exposed: No Necrotic Amount: Large (67-100%) Fat Layer (Subcutaneous Tissue) Exposed: Yes Necrotic Quality: Adherent Slough Tendon Exposed: No Muscle Exposed: No Joint Exposed: No Bone Exposed: No Livers, Legrand Rams ([id number]) [id number] Page 9 of 11  Periwound Skin Texture Texture Color No Abnormalities Noted: No No Abnormalities Noted: Yes Scarring: Yes Temperature / Pain Temperature: No Abnormality Moisture No Abnormalities Noted: Yes Treatment Notes Wound #17 (Lower Leg) Wound Laterality: Right, Lateral Cleanser Soap and Water Discharge Instruction: May shower and wash wound with dial antibacterial soap and water prior to dressing change. Wound Cleanser Discharge Instruction: Cleanse the wound with wound cleanser prior to applying a clean dressing using gauze sponges, not tissue or cotton balls. Peri-Wound Care Triamcinolone 15 (g) Discharge Instruction: Use triamcinolone 15 (g) as directed Sween Lotion (Moisturizing lotion) Discharge Instruction: Apply moisturizing lotion as directed Topical Primary Dressing ADAPTIC TOUCH 3x4.25 (in/in) Discharge Instruction: Apply to wound bed as instructed Secondary Dressing ABD Pad, 5x9 Discharge Instruction: Apply over primary dressing as directed. Secured With Elastic Bandage 4 inch (ACE bandage) Discharge Instruction: Secure with ACE bandage as directed. Compression Wrap Kerlix Roll 4.5x3.1 (in/yd) Discharge Instruction: Apply Kerlix and Coban compression as directed. Compression Stockings Add-Ons Electronic Signature(s) Signed: 06/04/2022 3:04:08 PM By: Blanche East RN Entered By: Blanche East on 06/04/2022 10:25:51 -------------------------------------------------------------------------------- Wound Assessment Details Patient Name: Date of Service: [name], [place] [name] 06/04/2022 9:45 A M Medical Record Number: [id number] Patient Account Number: [id number] Date of Birth/Sex: Treating  RN: 02-21-1971 (52 y.o. [name] Primary Care Velvet Moomaw: [name] Other Clinician: Referring Richele Strand: Treating Yordi Krager/Extender: Elyse Jarvis Weeks in Treatment: 17 Wound Status Wound Number: 21 Primary Etiology: Sickle Cell Lesion Wound Location: Right, Medial Ankle Secondary Etiology: Venous Leg Ulcer Wounding Event: Gradually Appeared Wound Status: Open Date Acquired: 06/26/2021 Comorbid History: Anemia, Sickle Cell Disease, Neuropathy Weeks Of Treatment: 49 Clustered Wound: No Hatchel, Legrand Rams ([id number]) [id number] Page 10 of 11 Photos Wound Measurements Length: (cm) 8 Width: (cm) 3 Depth: (cm) 0.1 Area: (cm) 18.85 Volume: (cm) 1.885 % Reduction in Area: 35% % Reduction in Volume: 35% Epithelialization: Small (1-33%) Tunneling: No Undermining: No Wound Description Classification: Full Thickness Without Exposed Support Structures Wound Margin: Distinct, outline attached Exudate Amount: Medium Exudate Type: Serosanguineous Exudate Color: red, brown Foul Odor After Cleansing: No Slough/Fibrino Yes Wound Bed Granulation Amount: Small (1-33%) Exposed Structure Granulation Quality: Pink Fascia Exposed: No Necrotic Amount: Large (67-100%) Fat Layer (Subcutaneous Tissue) Exposed: Yes Necrotic Quality: Adherent Slough Tendon Exposed: No Muscle Exposed: No Joint Exposed: No Bone Exposed: No Periwound Skin Texture Texture Color No Abnormalities Noted: No No Abnormalities Noted: Yes Scarring: Yes Temperature / Pain Temperature: No Abnormality Moisture No Abnormalities Noted: Yes Treatment Notes Wound #21 (Ankle) Wound Laterality: Right, Medial Cleanser Soap and Water Discharge Instruction: May shower and wash wound with dial antibacterial soap and water prior to dressing change. Wound Cleanser Discharge Instruction: Cleanse the wound with wound cleanser prior to applying a clean dressing using gauze  sponges, not tissue or cotton balls. Peri-Wound Care Triamcinolone 15 (g) Discharge Instruction: Use triamcinolone 15 (g) as directed Sween Lotion (Moisturizing lotion) Discharge Instruction: Apply moisturizing lotion as directed Topical Primary Dressing ADAPTIC TOUCH 3x4.25 (in/in) Discharge Instruction: Apply to wound bed as instructed Secondary Dressing ABD Pad, 5x9 Discharge Instruction: Apply over primary dressing as directed. Secured With [name] ([id number]) [id number] Page 11 of 11 Elastic Bandage 4 inch (ACE bandage) Discharge Instruction: Secure with ACE bandage as directed. Compression Wrap Kerlix Roll 4.5x3.1 (in/yd) Discharge Instruction: Apply Kerlix and Coban compression as directed. Compression Stockings Add-Ons Electronic Signature(s) Signed: 06/04/2022 3:04:08 PM By: Blanche East RN Entered By: Blanche East on 06/04/2022 10:26:19 -------------------------------------------------------------------------------- Vitals Details Patient Name: Date of Service: [name]  URO UMA, [name] 06/04/2022 9:45 A M Medical Record Number: [id number] Patient Account Number: [id number] Date of Birth/Sex: Treating RN: [birth year] (52 y.o. [name], [name] Primary Care Sundai Probert: [name] Other Clinician: Referring Suriah Peragine: Treating Seanmichael Salmons/Extender: Elyse Jarvis Weeks in Treatment: 29 Vital Signs Time Taken: 10:20 Temperature (F): 98.1 Height (in): 67 Pulse (bpm): 98 Respiratory Rate (breaths/min): 16 Blood Pressure (mmHg): 139/66 Reference Range: 80 - 120 mg / dl Electronic Signature(s) Signed: 06/04/2022 3:04:08 PM By: Blanche East RN Entered By: Blanche East on 06/04/2022 10:20:54

## 2022-06-04 NOTE — Progress Notes (Addendum)
ATALAYA, ZAPPIA ([id number]) [id number] Page 1 of 14 Visit Report for 06/04/2022 Chief Complaint Document Details Patient Name: Date of Service: [name] [name] 06/04/2022 9:45 A M Medical Record Number: [id number] Patient Account Number: [id number] Date of Birth/Sex: Treating RN: [birth year] (52 y.o. F) Primary Care Provider: [name] Other Clinician: Referring Provider: Treating Provider/Extender: Elyse Jarvis Weeks in Treatment: 57 Information Obtained from: Patient Chief Complaint the patient is here for evaluation of her bilateral lower extremity sickle cell ulcers 04/17/2021; patient comes in for substantial wounds on the right and left lower leg Electronic Signature(s) Signed: 06/04/2022 10:52:42 AM By: Fredirick Maudlin MD FACS Entered By: Fredirick Maudlin on 06/04/2022 10:52:42 -------------------------------------------------------------------------------- HPI Details Patient Name: Date of Service: [name] [name], [name] 06/04/2022 9:45 A M Medical Record Number: [id number] Patient Account Number: [id number] Date of Birth/Sex: Treating RN: 1970-08-04 (52 y.o. F) Primary Care Provider: [name] Other Clinician: Referring Provider: Treating Provider/Extender: Elyse Jarvis Weeks in Treatment: 50 History of Present Illness Location: medial and lateral ankle region on the right and left medial malleolus Quality: Patient reports experiencing a shooting pain to affected area(s). Severity: Patient states wound(s) are getting worse. Duration: right lower extremity bimalleolar ulcers have been present for approximately 2 years; the rright meedial malleolus ulcer has been there proximally 6 months Timing: Pain in wound is constant (hurts all the time) Context: The wound would happen gradually ssociated Signs and Symptoms: Patient reports having increase discharge. A HPI Description: 52 year old patient  with a history of sickle cell anemia who was last seen by me with ulceration of the right lower extremity above the ankle and was referred to Dr. Leland [name] for a surgical debridement as I was unable to do anything in the office due to excruciating pain. At that stage she was referred from the plastic surgery service to dermatology who treated her for a skin infection with doxycycline and then Levaquin and a local antibiotic ointment. I understand the patient has since developed ulceration on the left ankle both medial and lateral and was now referred back to the wound center as dermatology has finished the management. I do not have any notes from the dermatology department Old notes: 52 year old patient with a history of sickle cell anemia, pain bilateral lower extremities, right lower extremity ulcer and has a history of receiving a skin graft( Theraskin) several months ago. She has been visiting the wound center [facility] and was seen by Dr. Dellia Nims and Dr. Leland [name]. after prolonged conservator therapy between July 2016 and January 2017. She had been seen by the plastic surgeon and taken to the OR for debridement and application of Theraskin. She had 3 applications of Theraskin and was then treated with collagen. Prior to that she had a history of similar problems in 2014 and was treated conservatively. Had a reflux study done for the right lower extremity in August 2016 without reflux or DVT . Past medical history significant for sickle cell disease, anemia, leg ulcers, cholelithiasis,and has never been a [name]. Once the patient was discharged on the wound center she says within 2 or 3 weeks the problems recurred and she has been treating it conservatively. since I saw her 3 weeks ago at [facility] she has been unable to get her dressing material but has completed a course of doxycycline. 6/7/ 2017 -- lower extremity venous duplex reflux evaluation was done No evidence of SVT or DVT in the RLL. No  venous incompetence in the RLL. No further  vascular workup is indicated at this time. She was seen by Dr. Glenis [name], on 10/04/2015. She agreed with the plan of taking her to the OR for debridement and application of theraskin and would also take biopsies to rule out pyoderma gangrenosum. AGNIESZKA, NEWHOUSE ([id number]) [id number] Page 2 of 14 Follow-up note dated May 31 received and she was status post application of Theraskin to multiple ulcers around the right ankle. Pathology did not show evidence of malignancy or pyoderma gangrenosum. She would continue to see as in the wound clinic for further care and see Dr. Leland [name] as needed. The patient brought the biopsy report and it was consistent with stasis ulcer no evidence of malignancy and the comment was that there was some adjacent neovascularization, fibrosis and patchy perivascular chronic inflammation. 11/15/2015 -- today we applied her first application of Theraskin 11/30/15; TheraSkin #2 12/13/2015 -- she is having a lot of pain locally and is here for possible application of a theraskin today. 01/16/2016 -- the patient has significant pain and has noticed despite in spite of all local care and oral pain medication. It is impossible to debride her in the office. 02/06/2016 -- I do not see any notes from Dr. Iran Planas( the patient has not made a call to the office know as she heard from them) and the only visit to recently was with her PCP Dr. Danella [name] -- I saw her on 01/16/2016 and prescribed 90 tablets of oxycodone 10 mg and did lab work and screening for HIV. the HIV was negative and hemoglobin was 6.3 with a WBC count of 14.9 and hematocrit of 17.8 with platelets of 561. 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 The bimalleolar ulcers to the right lower extremity have been present for  approximately 2 years, the left medial malleolus ulcer has been present approximately 6 months. She has followed with Dr.Thimmappa in the past and has had a total of 3 applications of Theraskin (01/2015, 09/2015, 06/17/16). She has also followed with Dr. Con Memos here in the clinic and has received 2 applications of TheraSkin (11/10/15, 11/30/15). The patient does experience chronic, and is not amenable to debridement. 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 She is not currently on any antibiotic therapy and has not been treated with any recently. 07/17/2016 -- was seen by Dr. Iran Planas of plastic surgery who saw her 2 weeks postop application of Theraskin #3. She had removed her dressing and asked her to apply silver alginate on alternate days and follow-up back with the wound center. Future debridements and application of skin substitute would have to be done in the hospital due to her high risk for anesthesia. READMISSION 04/17/2021 Patient is now a 52 year old woman that we have had in this clinic for a prolonged period of time and 2016-2017 and then again for 2 visits in February 2018. At that point she had wounds on the right lower leg predominantly medial. She had also been seen by plastic surgery Dr. Leland [name] who I believe took her to the OR for operative debridement and application of TheraSkin in 2017. After she left our clinic she was followed for a very prolonged period of time in the wound care center in [facility] who then referred her ultimately to [facility] where she was seen by Dr. Vernona Rieger. Again taken her to the OR for skin grafting which apparently did  not take. She had multiple other attempts at dressings although I have not really looked over all of these notes in great detail. She has not been seen in a wound care center in about a year. She states over the last year in addition to her right lower leg she has developed wounds on the left lower leg  quite extensive. She is using Xeroform to all of these wounds without really any improvement. She also has Medicaid which does not cover wound products. The patient has had vascular work-ups in the past including most recently on 03/28/2021 showing biphasic waveforms on the right triphasic at the PTA and biphasic at the dorsalis pedis on the left. She was unable to tolerate any degree of compression to do ABIs. Unfortunately TBI's were also not done. She had venous reflux studies done in 2017. This did not show any evidence of a DVT or SVT and no venous incompetence was noted in the right leg at the time this was the only side with the wound As noted I did not look all over her old records. She apparently had a course of HBO and [facility] although I am not sure what the indication would have been. In any case she developed seizures and terminated treatment earlier. She is generally much more disabled than when we last saw her in clinic. She can no longer walk pretty much wheelchair-bound because predominantly of pain in the left hip. 04/24/2021; the patient tolerated the wraps we put on. We used Santyl and Hydrofera Blue under compression. I brought her back for a nurse visit for a change in dressing. With Medicaid we will have a hard time getting anything paid for and hence the need for compression. She arrives in clinic with all the wounds looking somewhat better in terms of surface 12/20; circumferential wound on the right from the lateral to the medial. She has open areas on the left medial and left lateral x2 on all of this with the same surface. This does not look completely healthy although she does have some epithelialization. She is not complaining of a lot of pain which is unusual for her sickle ulcers. I have not looked over her extensive records from [facility]. She had recent arterial studies and has a history of venous reflux studies I will need to look these over although I do not believe she  has significant arterial disease 2023 05/22/2021; patient's wound areas measure slightly smaller. Still a lot of drainage coming from the right we have been using Hydrofera Blue and Santyl with some improvement in the wound surfaces. She tells me she will be getting transfused later in the week for her underlying sickle cell anemia I have looked over her recent arterial studies which were done in the fall. This was in November and showed biphasic and triphasic waveforms but she could not tolerate ABIs because of pressure and unfortunately TBI's were not done. She has not had recent venous reflux studies that I can see 1/10; not much change about the same surface area. This has a yellowish surface to it very gritty. We have been using Santyl and Hydrofera Blue for a prolonged period. Culture I did last week showed methicillin sensitive staph aureus "rare". Our intake nurse reports greenish drainage which may be the Hydrofera Blue itself 1/17; wounds are continue to measure smaller although I am not sure about the accuracy here. Especially the areas on the right are covered in what looks to be a nonviable surface although she  does have some epithelialization. Similarly she has areas on the left medial and left lateral ankle area which appear to have a better surface and perhaps are slightly smaller. We have been using Santyl and Hydrofera Blue. She cannot tolerate mechanical debridements She went for her reflux studies which showed significant reflux at the greater saphenous vein at the saphenofemoral junction as well as the greater saphenous vein in the proximal calf on the left she had reflux in the thigh and the common femoral vein and supra vein Fishel vein reflux in the greater saphenous vein. I will have vein and vascular look at this. My thoughts have been that these are likely sickle wounds. I looked through her old records from [facility] wound care center and then when she graduated to [facility] wound care center where she saw Dr. Zigmund Daniel and Dr. Vernona Rieger. Although I can see she had reflux studies done I do not see that she actually saw a vein and vascular. I went over the fact that she had operative debridements and actual skin grafting that did not take. I do not think these wounds have ever really progressed towards healing 1/31;Substantial wounds on the right ankle area. Hyper granulated very gritty adherent debris on the surface. 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 On the left she has no DVT or SVT deep vein reflux in the common femoral vein. Superficial vein reflux in the greater saphenous vein on although the vein diameter is not really all that large. I do not think there is anything that can be done with these although I am going to send her for consultation to vein and vascular. 2/7; Wound exam; substantial wound area on the right posterior ankle area and areas on the left medial ankle and left lateral ankle. I was able to debride the left medial ankle last week fairly aggressively and it is back this week to a completely nonviable surface She will see vascular surgery this Friday and I would like them to review the venous studies and also any comments on her arterial status. If they do not see an issue here I am going to refer her to plastic surgery for an operative debridement perhaps intraoperative ACell or Integra. Eventually she will require a deep ANGLA, DELAHUNT ([id number]) [id number] Page 3 of 14 tissue culture again 2/14; substantial wound area on the right posterior  ankle, medial ankle. We have been using silver alginate The patient was seen by vein and vascular she had both venous reflux studies and arterial studies. 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 She had no evidence of superficial venous thrombosis. She did have superficial vein reflux at the saphenofemoral junction and the greater saphenous vein. On the left no evidence of a DVT no evidence of superficial venous throat thrombosis she did have deep vein reflux in the common femoral vein and superficial vein reflux in the greater saphenous vein but these were not felt to be amenable to ablation. In terms of arterial studies she had triphasic and wife  biphasic wave waveforms bilaterally not felt to have a significant arterial issue. I do not get the feeling that they felt that any part of her nonhealing wounds were related to either arterial or venous issues. They did note that she had venous reflux at the right at the [facility] and [facility]. And also on the left there were reflux in the deep system at the common femoral vein and greater saphenous vein in the proximal thigh. Nothing amenable to ablation. 2/20; she is making some decent progress on the right where there is nice skin between the 2 open areas on the right ankle. The surfaces here do not look viable yet there is some surrounding epithelialization. She still has a small area on the left medial ankle area. Hyper-granulated Jody's away always 2/28 patient has an appointment with plastic surgery on 3/8. We will see her back on 3/9. She may have to call us to get the area redressed. We've been using Santyl under silver alginate. We made a nice improvement on the left medial ankle. The larger wounds on the right also looks somewhat better in terms of epithelialization although I think they could benefit from an aggressive debridement if plastic surgery would be willing to do that.  Perhaps placement of Integra or a cell 07/26/2021: She saw Dr. Claudia Desanctis yesterday. He raised the question as to whether or not this might be pyoderma and wanted to wait until that question was answered by dermatology before proceeding with any sort of operative debridement. We have continue to use Santyl under silver alginate with Kerlix and Coban wraps. Overall, her wounds appear to be continuing to contract and epithelialize, with some granulation tissue present. There continues to be some slough on all wound surfaces. 08/09/2021: She has not been able to get an appointment with dermatology because apparently the offices in [place] do not accept Medicaid. She is looking into whether or not she can be seen at the main [facility] dermatology clinic. This is necessary because plastic surgery is concerned that her wounds might represent pyoderma and they did not want to do any procedure until that was clarified. We have been using Santyl under silver alginate with Kerlix and Coban wraps. Today, there was a greater amount of drainage on her dressings with a slight green discoloration and significant odor. Despite this, her wounds continue to contract and epithelialize. There is pale granulation tissue present and actually, on the left medial ankle, the granulation tissue is a bit hypertrophic. 08/16/2021: Last week, I took a culture and this grew back rare methicillin-resistant Staph aureus and rare corynebacterium. The MRSA was sensitive to gentamicin which we began applying topically on an empiric basis. This week, her wounds are a bit smaller and the drainage and odor are less. Her primary care provider is working on assisting the patient with a dermatology evaluation. She has been in silver alginate over the gentamicin that was started last week along with Kerlix and Coban wraps. 08/23/2021: Because she has Medicaid, we have been unable to get her into see any dermatologist in the  [place] to rule out pyoderma gangrenosum, which was a requirement from plastic surgery prior to any sort of debridement and grafting. Despite this, however, all of her wounds continue to get smaller. The wound on her left medial ankle is nearly closed. There is no odor from the wounds, although she still accumulates a modest amount of drainage on her dressings. 08/30/2021: The lateral right ankle wound and the  medial left ankle wound are a bit smaller today. The medial right ankle wound is about the same size. They are less tender. We have still been unable to get her into dermatology. 09/06/2021: All of the wounds are about the same size today. She continues to endorse minimal pain. I communicated with Dr. Claudia Desanctis in plastic surgery regarding our issues getting a dermatology appointment; he was out of town but indicated that he would look into perhaps performing the biopsy in his office and will have his office contact her. 09/14/2021: The patient has an appointment in dermatology, but it is not until October. Her wounds are roughly the same; she continues to have very thick purulent-looking drainage on her dressings. 09/20/2021: The left medial wound is nearly closed and just has a bit of accumulated eschar on the surface. The right medial and lateral ankle wounds are perhaps a little bit smaller. They continue to have a very pale surface with accumulation of thin slough. 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 I did not think [employer] was indicated based on this. She is getting topical mupirocin with Prisma silver collagen. 10/04/2021: The patient was not seen in clinic last week due to childcare coverage issues. In the interim, the left medial leg wound has closed. The right sided leg wounds are smaller. There is more granulation tissue coming through, particularly on the lateral wound. The surface remains somewhat gritty. We have been applying topical mupirocin and Prisma silver  collagen. 10/11/2021: The left medial leg wound remains closed. She does complain of some anesthetic sensation to the area. Both of the right-sided leg wounds are smaller but still have accumulated slough. 10/18/2021: Both right-sided leg wounds are minimally smaller this week. She still continues to accumulate slough and has thick drainage on her dressings. 10/23/2021: Both wounds continue to contract. There is still slough buildup. She has been approved for a keratin-based skin substitute trial product but it will not be available until next week. 10/30/2021: The wounds are about the same to perhaps slightly smaller. There is still continued slough buildup. Unfortunately, the rep for the keratin based product did not show up today and did not answer his phone when called. 11/08/2021: The wounds are little bit smaller today. She continues to have thick drainage but the surfaces are relatively clean with just a little bit of slough accumulation. She reported to me today that she is unable to completely flex her left ankle and on examination it seems this is potentially related to scar tissue from her wounds. We do have the ProgenaMatrix trial product available for her today. 11/15/2021: Both wounds are smaller today. There is some slough accumulation on the surfaces, but the medial wound, in particular looks like it is filling in and is less deep. She did hear from physical therapy and she is going to start working with them on July 11. She is here for her second application of the trial skin substitute, ProgenaMatrix. 11/22/2021: Both wounds continue to contract, the medial more dramatically than the lateral. Both wounds have a layer of slough on the surface, but underneath this, the gritty fibrous tissue has a little bit more of a pink cast to it rather than being as pale as it has been. 11/29/2021: The wounds are roughly the same size this week, perhaps a millimeter or 2 smaller. The medial wound has filled  in and is nearly flush with the surrounding skin surface. She continues to have a lot of  slough accumulation on both surfaces. 12/06/2021: No significant change to her wounds, but she has a new opening on her dorsal foot, just distal to the right lateral ankle wound. The area on her left medial ankle that reopened looks a little bit larger today. She has quite a bit of pain associated with the new wound. 12/12/2021: Her wounds look about the same but the new opening on her right lateral dorsal foot is a little bit bigger. She continues to have a fair amount of pain with this wound. 12/26/2021: The left medial ankle wound is tiny and superficial. She has 2 areas of crusting on her left lateral ankle, however, that appear to be threatening to ZOANNE, NEWILL ([id number]) [id number] Page 4 of 14 open again. Her right medial ankle wound is a little bit smaller today but still continues to accumulate thick rubbery slough. The new dorsal foot wound is exquisitely painful but there is no odor or purulent drainage. No erythema or induration. The right lateral ankle wound looks about the same today, again with thick rubbery slough. 01/03/2022: The left medial ankle wound has closed again. Both right ankle wounds appear to be about the same size with thick rubbery slough. The dorsal foot wound on the right continues to be quite painful and she stated that she did not want any debridement of that site today. 01/10/2022: No real change to any of her wounds. She continues to accumulate thick slough. The dorsal foot wound has merged with the lateral malleolar wound. She is experiencing significant pain in the dorsal foot portion of the ulcer. 01/16/2022: Absolutely no change or progress in her wounds. 01/24/2022: Her wounds are unchanged. She continues to build up slough and the wounds on her dorsal right foot are still exquisitely tender. 01/31/2022: The wounds actually measure a little bit  narrower today. They still have thick slough on the surface but the underlying tissue seems a little less fibrotic. We changed to Iodosorb last week. 02/07/2022: Wounds continue to slowly epithelialize around the parameters. She has less pain today. She still accumulates a fairly substantial layer of slough. 02/15/2022: No change to the wounds overall. The measured a little bit larger today per the intake nurse. She continues to have substantial slough accumulation and her pain is a little bit worse. 02/21/2022. No change at all to her wounds. I do not see that plastic surgery has received her referral yet. 02/28/2022: The wounds remain unchanged. They are dry and fibrotic with accumulation of slough and eschar. She did receive an appointment to see plastic surgery on October 23, but the office called her back and indicated they needed to reschedule and that the next available appointment was not until December. The patient became angry and decided she did not want to see this plastics group. 10/19; the patient sees Dr. Lovett Calender again next week. She is using Medihoney as the primary dressing changing this herself. 03/21/2022: She saw Dr. Jaclynn Guarneri at the wound care center at [facility]. Dr. Jaclynn Guarneri was also unable to debride her, secondary to pain and is planning to take her to the operating room for operative debridement and potential skin substitute placement. Her wounds are unchanged with thick yellow slough and a fibrotic base. She says they are too painful to be debrided. In addition, yesterday her hemoglobin was 4 and she received 2 units packed red blood cell transfusion. 04/04/2022: Her operation at [facility] is scheduled for December 15. She continues to use Medihoney on her  wounds. They are a little bit less painful today and she is willing to entertain the possibility of debridement. The wounds measured a little bit larger in all dimensions today but the layer of slough is  not as thick as usual. 04/18/2022: Her wounds actually measured a little bit smaller today and the extension onto the dorsal foot from the lateral wound has healed. They are less tender and there is significantly less slough present as compared to prior visits. 05/09/2022: She had to reschedule her surgery due to lack of childcare. It is now planned for January 5. The wounds are basically unchanged, but she had a lot more drainage, which was thick and somewhat purulent in appearance. No significant odor. 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 They are quite painful today and she does not want to have debridement performed. 05/30/2022: The patient underwent surgical debridement and placement of TheraSkin to her wounds on January 5. She has been doing well and does not endorse any pain. The TheraSkin is intact and looks beautiful. It is sutured in place. There is no exudate or significant drainage. 06/04/2022: Her TheraSkin remains intact and I am starting to see granulation tissue emerging underneath the surface. No concern for infection. Electronic Signature(s) Signed: 06/04/2022 10:53:15 AM By: Fredirick Maudlin MD FACS Entered By: Fredirick Maudlin on 06/04/2022 10:53:15 -------------------------------------------------------------------------------- Physical Exam Details Patient Name: Date of Service: [name] [name], [name] 06/04/2022 9:45 A M Medical Record Number: [id number] Patient Account Number: [id number] Date of Birth/Sex: Treating RN: 12/16/1970 (52 y.o. F) Primary Care Provider: [name] Other Clinician: Referring Provider: Treating Provider/Extender: Elyse Jarvis Weeks in Treatment: 74 Constitutional . . . . no acute distress. Respiratory Normal work of breathing on room air. Notes 06/04/2022: Her TheraSkin remains intact and I am starting to see granulation tissue emerging underneath the surface. No concern for infection. Electronic  Signature(s) Signed: 06/04/2022 10:53:45 AM By: Fredirick Maudlin MD FACS Hachita, Legrand Rams ([id number]) AM By: Fredirick Maudlin MD FACS [phone].pdf Page 5 of 14 Signed: 06/04/2022 10:53:45 Entered By: Fredirick Maudlin on 06/04/2022 10:53:45 -------------------------------------------------------------------------------- Physician Orders Details Patient Name: Date of Service: [name], [place] [name] 06/04/2022 9:45 A M Medical Record Number: [id number] Patient Account Number: [id number] Date of Birth/Sex: Treating RN: June 14, 1970 (52 y.o. [name] Primary Care Provider: [name] Other Clinician: Referring Provider: Treating Provider/Extender: Elyse Jarvis Weeks in Treatment: 9 Verbal / Phone Orders: No Diagnosis Coding ICD-10 Coding Code Description L97.818 Non-pressure chronic ulcer of other part of right lower leg with other specified severity 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 Follow-up Appointments ppointment in 1 week. - Dr. Celine Ahr - room 3 Return A Cellular or Tissue Based Products Other Cellular or Tissue Based Products Orders/Instructions: - Theraskin applied by plastic surgeon, leave on until further notice Bathing/ Shower/ Hygiene Do not shower or bathe in tub. Edema Control - Lymphedema / SCD / Other Avoid standing for long periods of time. Exercise regularly Additional Orders / Instructions Follow Nutritious Diet Wound Treatment Wound #17 - Lower Leg Wound Laterality: Right, Lateral Cleanser: Soap and Water Every Other Day/30 Days Discharge Instructions: May shower and wash wound with dial antibacterial soap and water prior to dressing change. Cleanser: Wound Cleanser Every Other Day/30 Days Discharge Instructions: Cleanse the wound with wound cleanser prior to applying a clean dressing using gauze sponges, not tissue or cotton balls. Peri-Wound Care: Triamcinolone 15 (g) Every Other Day/30 Days Discharge  Instructions: Use triamcinolone 15 (g) as directed Peri-Wound  Care: Sween Lotion (Moisturizing lotion) Every Other Day/30 Days Discharge Instructions: Apply moisturizing lotion as directed Prim Dressing: ADAPTIC TOUCH 3x4.25 (in/in) Every Other Day/30 Days ary Discharge Instructions: Apply to wound bed as instructed Secondary Dressing: ABD Pad, 5x9 Every Other Day/30 Days Discharge Instructions: Apply over primary dressing as directed. Secured With: Elastic Bandage 4 inch (ACE bandage) Every Other Day/30 Days Discharge Instructions: Secure with ACE bandage as directed. Compression Wrap: Kerlix Roll 4.5x3.1 (in/yd) Every Other Day/30 Days Discharge Instructions: Apply Kerlix and Coban compression as directed. Wound #21 - Ankle Wound Laterality: Right, Medial Cleanser: Soap and Water Every Other Day/30 Days Discharge Instructions: May shower and wash wound with dial antibacterial soap and water prior to dressing change. Cleanser: Wound Cleanser Every Other Day/30 Days Discharge Instructions: Cleanse the wound with wound cleanser prior to applying a clean dressing using gauze sponges, not tissue or cotton balls. NAZANIN, KINNER ([id number]) [id number] Page 6 of 14 Peri-Wound Care: Triamcinolone 15 (g) Every Other Day/30 Days Discharge Instructions: Use triamcinolone 15 (g) as directed Peri-Wound Care: Sween Lotion (Moisturizing lotion) Every Other Day/30 Days Discharge Instructions: Apply moisturizing lotion as directed Prim Dressing: ADAPTIC TOUCH 3x4.25 (in/in) Every Other Day/30 Days ary Discharge Instructions: Apply to wound bed as instructed Secondary Dressing: ABD Pad, 5x9 Every Other Day/30 Days Discharge Instructions: Apply over primary dressing as directed. Secured With: Elastic Bandage 4 inch (ACE bandage) Every Other Day/30 Days Discharge Instructions: Secure with ACE bandage as directed. Compression Wrap: Kerlix Roll 4.5x3.1 (in/yd) Every Other Day/30  Days Discharge Instructions: Apply Kerlix and Coban compression as directed. Electronic Signature(s) Signed: 06/04/2022 11:02:56 AM By: Fredirick Maudlin MD FACS Entered By: Fredirick Maudlin on 06/04/2022 10:53:56 -------------------------------------------------------------------------------- Problem List Details Patient Name: Date of Service: [name] [name], [name] 06/04/2022 9:45 A M Medical Record Number: [id number] Patient Account Number: [id number] Date of Birth/Sex: Treating RN: [birth year] (52 y.o. [name] Primary Care Provider: [name] Other Clinician: Referring Provider: Treating Provider/Extender: Elyse Jarvis Weeks in Treatment: 68 Active Problems ICD-10 Encounter Code Description Active Date MDM Diagnosis L97.818 Non-pressure chronic ulcer of other part of right lower leg with other specified 04/17/2021 No Yes severity 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 04/17/2021 No Yes Inactive Problems ICD-10 Code Description Active Date Inactive Date L97.828 Non-pressure chronic ulcer of other part of left lower leg with other specified severity 04/17/2021 04/17/2021 Resolved Problems Electronic Signature(s) Signed: 06/04/2022 10:52:28 AM By: Fredirick Maudlin MD FACS Previous Signature: 06/04/2022 10:32:57 AM Version By: Blanche East RN Entered By: Fredirick Maudlin on 06/04/2022 10:52:28 [name] ([id number]) [id number] Page 7 of 14 -------------------------------------------------------------------------------- Progress Note Details Patient Name: Date of Service: [name], [place] [name] 06/04/2022 9:45 A M Medical Record Number: [id number] Patient Account Number: [id number] Date of Birth/Sex: Treating RN: 24-Jan-1971 (52 y.o. F) Primary Care Provider: [name] Other Clinician: Referring Provider: Treating Provider/Extender: Elyse Jarvis Weeks in Treatment: 77 Subjective Chief  Complaint Information obtained from Patient the patient is here for evaluation of her bilateral lower extremity sickle cell ulcers 04/17/2021; patient comes in for substantial wounds on the right and left lower leg History of Present Illness (HPI) The following HPI elements were documented for the patient's wound: Location: medial and lateral ankle region on the right and left medial malleolus Quality: Patient reports experiencing a shooting pain to affected area(s). Severity: Patient states wound(s) are getting worse. Duration: right lower extremity bimalleolar ulcers have been present for approximately 2 years; the rright meedial malleolus ulcer has been  there proximally 6 months Timing: Pain in wound is constant (hurts all the time) Context: The wound would happen gradually Associated Signs and Symptoms: Patient reports having increase discharge. 52 year old patient with a history of sickle cell anemia who was last seen by me with ulceration of the right lower extremity above the ankle and was referred to Dr. Leland [name] for a surgical debridement as I was unable to do anything in the office due to excruciating pain. At that stage she was referred from the plastic surgery service to dermatology who treated her for a skin infection with doxycycline and then Levaquin and a local antibiotic ointment. I understand the patient has since developed ulceration on the left ankle both medial and lateral and was now referred back to the wound center as dermatology has finished the management. I do not have any notes from the dermatology department Old notes: 52 year old patient with a history of sickle cell anemia, pain bilateral lower extremities, right lower extremity ulcer and has a history of receiving a skin graft( Theraskin) several months ago. She has been visiting the wound center [facility] and was seen by Dr. Dellia Nims and Dr. Leland [name]. after prolonged conservator therapy between July 2016 and  January 2017. She had been seen by the plastic surgeon and taken to the OR for debridement and application of Theraskin. She had 3 applications of Theraskin and was then treated with collagen. Prior to that she had a history of similar problems in 2014 and was treated conservatively. Had a reflux study done for the right lower extremity in August 2016 without reflux or DVT . Past medical history significant for sickle cell disease, anemia, leg ulcers, cholelithiasis,and has never been a [name]. Once the patient was discharged on the wound center she says within 2 or 3 weeks the problems recurred and she has been treating it conservatively. since I saw her 3 weeks ago at [facility] she has been unable to get her dressing material but has completed a course of doxycycline. 6/7/ 2017 -- lower extremity venous duplex reflux evaluation was done oo No evidence of SVT or DVT in the RLL. No venous incompetence in the RLL. No further vascular workup is indicated at this time. She was seen by Dr. Glenis [name], on 10/04/2015. She agreed with the plan of taking her to the OR for debridement and application of theraskin and would also take biopsies to rule out pyoderma gangrenosum. Follow-up note dated May 31 received and she was status post application of Theraskin to multiple ulcers around the right ankle. Pathology did not show evidence of malignancy or pyoderma gangrenosum. She would continue to see as in the wound clinic for further care and see Dr. Leland [name] as needed. The patient brought the biopsy report and it was consistent with stasis ulcer no evidence of malignancy and the comment was that there was some adjacent neovascularization, fibrosis and patchy perivascular chronic inflammation. 11/15/2015 -- today we applied her first application of Theraskin 11/30/15; TheraSkin #2 12/13/2015 -- she is having a lot of pain locally and is here for possible application of a theraskin today. 01/16/2016 --  the patient has significant pain and has noticed despite in spite of all local care and oral pain medication. It is impossible to debride her in the office. 02/06/2016 -- I do not see any notes from Dr. Iran Planas( the patient has not made a call to the office know as she heard from them) and the only visit to recently was with her PCP Dr.  Danella [name] -- I saw her on 01/16/2016 and prescribed 90 tablets of oxycodone 10 mg and did lab work and screening for HIV. the HIV was negative and hemoglobin was 6.3 with a WBC count of 14.9 and hematocrit of 17.8 with platelets of 561. 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 The bimalleolar ulcers to the right lower extremity have been present for approximately 2 years, the left medial malleolus ulcer has been present approximately 6 months. She has followed with Dr.Thimmappa in the past and has had a total of 3 applications of Theraskin (01/2015, 09/2015, 06/17/16). She has also followed with Dr. Con Memos here in the clinic and has received 2 applications of TheraSkin (11/10/15, 11/30/15). The patient does experience chronic, and is not amenable to debridement. 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 She is not currently on any antibiotic therapy and has not been treated with any recently. 07/17/2016 -- was seen by Dr. Iran Planas of plastic surgery who saw her 2 weeks postop application of Theraskin #3. She had removed her dressing and asked her to apply silver alginate on alternate days and follow-up back with the wound center. Future debridements and application of skin substitute would have to be done in the hospital due to her high risk for anesthesia. READMISSION 04/17/2021 Patient is now a 52 year old woman that we have had in this clinic for a prolonged period of time and 2016-2017 and then again for  2 visits in February 2018. At that point she had wounds on the right lower leg predominantly medial. She had also been seen by plastic surgery Dr. Leland [name] who I believe took her to the OR for operative debridement and application of TheraSkin in 2017. After she left our clinic she was followed for a very prolonged period of time in the wound care center in [facility] who then referred her ultimately to [facility] where she was seen by Dr. Vernona Rieger. Again taken her to the OR for skin grafting which apparently did not take. She had multiple other attempts at dressings although I have not really looked over all of these notes in great detail. She has not been seen in a wound care center in about a year. She states over the last year in addition to her right lower leg she has developed wounds on the left lower leg quite Garnavillo, Legrand Rams ([id number]) [id number] Page 8 of 14 extensive. She is using Xeroform to all of these wounds without really any improvement. She also has Medicaid which does not cover wound products. The patient has had vascular work-ups in the past including most recently on 03/28/2021 showing biphasic waveforms on the right triphasic at the PTA and biphasic at the dorsalis pedis on the left. She was unable to tolerate any degree of compression to do ABIs. Unfortunately TBI's were also not done. She had venous reflux studies done in 2017. This did not show any evidence of a DVT or SVT and no venous incompetence was noted in the right leg at the time this was the only side with the wound As noted I did not look all over her old records. She apparently had a course of HBO and [facility] although I am not sure what the indication would have been. In any case she developed seizures and terminated treatment earlier. She is generally much more disabled than when we last saw  her in clinic. She can no longer walk pretty much wheelchair-bound because predominantly of pain in the  left hip. 04/24/2021; the patient tolerated the wraps we put on. We used Santyl and Hydrofera Blue under compression. I brought her back for a nurse visit for a change in dressing. With Medicaid we will have a hard time getting anything paid for and hence the need for compression. She arrives in clinic with all the wounds looking somewhat better in terms of surface 12/20; circumferential wound on the right from the lateral to the medial. She has open areas on the left medial and left lateral x2 on all of this with the same surface. This does not look completely healthy although she does have some epithelialization. She is not complaining of a lot of pain which is unusual for her sickle ulcers. I have not looked over her extensive records from [facility]. She had recent arterial studies and has a history of venous reflux studies I will need to look these over although I do not believe she has significant arterial disease 2023 05/22/2021; patient's wound areas measure slightly smaller. Still a lot of drainage coming from the right we have been using Hydrofera Blue and Santyl with some improvement in the wound surfaces. She tells me she will be getting transfused later in the week for her underlying sickle cell anemia I have looked over her recent arterial studies which were done in the fall. This was in November and showed biphasic and triphasic waveforms but she could not tolerate ABIs because of pressure and unfortunately TBI's were not done. She has not had recent venous reflux studies that I can see 1/10; not much change about the same surface area. This has a yellowish surface to it very gritty. We have been using Santyl and Hydrofera Blue for a prolonged period. Culture I did last week showed methicillin sensitive staph aureus "rare". Our intake nurse reports greenish drainage which may be the Hydrofera Blue itself 1/17; wounds are continue to measure smaller although I am not sure about the accuracy  here. Especially the areas on the right are covered in what looks to be a nonviable surface although she does have some epithelialization. Similarly she has areas on the left medial and left lateral ankle area which appear to have a better surface and perhaps are slightly smaller. We have been using Santyl and Hydrofera Blue. She cannot tolerate mechanical debridements She went for her reflux studies which showed significant reflux at the greater saphenous vein at the saphenofemoral junction as well as the greater saphenous vein in the proximal calf on the left she had reflux in the thigh and the common femoral vein and supra vein Fishel vein reflux in the greater saphenous vein. I will have vein and vascular look at this. My thoughts have been that these are likely sickle wounds. I looked through her old records from [facility] wound care center and then when she graduated to [facility] wound care center where she saw Dr. Zigmund Daniel and Dr. Vernona Rieger. Although I can see she had reflux studies done I do not see that she actually saw a vein and vascular. I went over the fact that she had operative debridements and actual skin grafting that did not take. I do not think these wounds have ever really progressed towards healing 1/31;Substantial wounds on the right ankle area. Hyper granulated very gritty adherent debris on the surface. 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 On the left she has no DVT or SVT deep vein reflux in the common femoral vein. Superficial vein reflux in the greater saphenous vein on although the vein diameter is not really  all that large. I do not think there is anything that can be done with these although I am going to send her for consultation to vein and vascular. 2/7; Wound exam; substantial wound area on the right posterior ankle area and areas on the left medial ankle and left lateral ankle. I was able to debride the left medial ankle last week fairly aggressively and it is back this week to a completely nonviable surface She will see vascular surgery this Friday and I would like them to review the venous studies and also any comments on her arterial status. If they do not see an issue here I am going to refer her to plastic surgery for an operative debridement perhaps intraoperative ACell or Integra. Eventually she will require a deep tissue culture again 2/14; substantial wound area on the right posterior ankle, medial ankle. We have been using silver alginate The patient was seen by vein and vascular she had both venous reflux studies and arterial studies. 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 She had no evidence of superficial venous thrombosis. She did have superficial vein reflux at the saphenofemoral junction and the greater saphenous vein. On the left no evidence of a DVT no evidence of superficial venous throat thrombosis she did have deep vein reflux in the common femoral vein and superficial vein reflux in the greater saphenous vein but these were not felt to be amenable to ablation. In terms of arterial studies she had triphasic and wife biphasic wave waveforms bilaterally not felt to have a significant arterial issue. I do not get the feeling that they felt that any part of her nonhealing wounds were related to either arterial or venous issues. They did note that she had venous reflux at the right at the [facility] and [facility]. And also on the left there were reflux in the deep system at the common femoral vein and greater saphenous  vein in the proximal thigh. Nothing amenable to ablation. 2/20; she is making some decent progress on the right where there is nice skin between the 2 open areas on the right ankle. The surfaces here do not look viable yet there is some surrounding epithelialization. She still has a small area on the left medial ankle area. Hyper-granulated Jody's away always 2/28 patient has an appointment with plastic surgery on 3/8. We will see her back on 3/9. She may have to call us to get the area redressed. We've been using Santyl under silver alginate. We made a nice improvement on the left medial ankle. The larger wounds on the right also looks somewhat better in terms of epithelialization although I think they could benefit from an aggressive debridement if plastic surgery would be willing to do that. Perhaps placement of Integra or a cell 07/26/2021: She saw Dr. Claudia Desanctis yesterday. He raised the question as to whether or not this  might be pyoderma and wanted to wait until that question was answered by dermatology before proceeding with any sort of operative debridement. We have continue to use Santyl under silver alginate with Kerlix and Coban wraps. Overall, her wounds appear to be continuing to contract and epithelialize, with some granulation tissue present. There continues to be some slough on all wound surfaces. 08/09/2021: She has not been able to get an appointment with dermatology because apparently the offices in [place] do not accept Medicaid. She is looking into whether or not she can be seen at the main [facility] dermatology clinic. This is necessary because plastic surgery is concerned that her wounds might represent pyoderma and they did not want to do any procedure until that was clarified. We have been using Santyl under silver alginate with Kerlix and Coban wraps. Today, there was a greater amount of drainage on her dressings with a slight green discoloration and  significant odor. Despite this, her wounds continue to contract and epithelialize. There is pale granulation tissue present and actually, on the left medial ankle, the granulation tissue is a bit hypertrophic. IREENE, BALLOWE ([id number]) [id number] Page 9 of 14 08/16/2021: Last week, I took a culture and this grew back rare methicillin-resistant Staph aureus and rare corynebacterium. The MRSA was sensitive to gentamicin which we began applying topically on an empiric basis. This week, her wounds are a bit smaller and the drainage and odor are less. Her primary care provider is working on assisting the patient with a dermatology evaluation. She has been in silver alginate over the gentamicin that was started last week along with Kerlix and Coban wraps. 08/23/2021: Because she has Medicaid, we have been unable to get her into see any dermatologist in the [place] to rule out pyoderma gangrenosum, which was a requirement from plastic surgery prior to any sort of debridement and grafting. Despite this, however, all of her wounds continue to get smaller. The wound on her left medial ankle is nearly closed. There is no odor from the wounds, although she still accumulates a modest amount of drainage on her dressings. 08/30/2021: The lateral right ankle wound and the medial left ankle wound are a bit smaller today. The medial right ankle wound is about the same size. They are less tender. We have still been unable to get her into dermatology. 09/06/2021: All of the wounds are about the same size today. She continues to endorse minimal pain. I communicated with Dr. Claudia Desanctis in plastic surgery regarding our issues getting a dermatology appointment; he was out of town but indicated that he would look into perhaps performing the biopsy in his office and will have his office contact her. 09/14/2021: The patient has an appointment in dermatology, but it is not until October. Her wounds are roughly  the same; she continues to have very thick purulent-looking drainage on her dressings. 09/20/2021: The left medial wound is nearly closed and just has a bit of accumulated eschar on the surface. The right medial and lateral ankle wounds are perhaps a little bit smaller. They continue to have a very pale surface with accumulation of thin slough. 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 I did not think [employer] was indicated based on this. She is getting topical mupirocin with Prisma silver collagen. 10/04/2021: The patient was not seen in clinic last week due to childcare coverage issues. In the interim, the left medial leg wound has closed. The right sided leg  wounds are smaller. There is more granulation tissue coming through, particularly on the lateral wound. The surface remains somewhat gritty. We have been applying topical mupirocin and Prisma silver collagen. 10/11/2021: The left medial leg wound remains closed. She does complain of some anesthetic sensation to the area. Both of the right-sided leg wounds are smaller but still have accumulated slough. 10/18/2021: Both right-sided leg wounds are minimally smaller this week. She still continues to accumulate slough and has thick drainage on her dressings. 10/23/2021: Both wounds continue to contract. There is still slough buildup. She has been approved for a keratin-based skin substitute trial product but it will not be available until next week. 10/30/2021: The wounds are about the same to perhaps slightly smaller. There is still continued slough buildup. Unfortunately, the rep for the keratin based product did not show up today and did not answer his phone when called. 11/08/2021: The wounds are little bit smaller today. She continues to have thick drainage but the surfaces are relatively clean with just a little bit of slough accumulation. She reported to me today that she is unable to completely flex her left ankle and on  examination it seems this is potentially related to scar tissue from her wounds. We do have the ProgenaMatrix trial product available for her today. 11/15/2021: Both wounds are smaller today. There is some slough accumulation on the surfaces, but the medial wound, in particular looks like it is filling in and is less deep. She did hear from physical therapy and she is going to start working with them on July 11. She is here for her second application of the trial skin substitute, ProgenaMatrix. 11/22/2021: Both wounds continue to contract, the medial more dramatically than the lateral. Both wounds have a layer of slough on the surface, but underneath this, the gritty fibrous tissue has a little bit more of a pink cast to it rather than being as pale as it has been. 11/29/2021: The wounds are roughly the same size this week, perhaps a millimeter or 2 smaller. The medial wound has filled in and is nearly flush with the surrounding skin surface. She continues to have a lot of slough accumulation on both surfaces. 12/06/2021: No significant change to her wounds, but she has a new opening on her dorsal foot, just distal to the right lateral ankle wound. The area on her left medial ankle that reopened looks a little bit larger today. She has quite a bit of pain associated with the new wound. 12/12/2021: Her wounds look about the same but the new opening on her right lateral dorsal foot is a little bit bigger. She continues to have a fair amount of pain with this wound. 12/26/2021: The left medial ankle wound is tiny and superficial. She has 2 areas of crusting on her left lateral ankle, however, that appear to be threatening to open again. Her right medial ankle wound is a little bit smaller today but still continues to accumulate thick rubbery slough. The new dorsal foot wound is exquisitely painful but there is no odor or purulent drainage. No erythema or induration. The right lateral ankle wound looks about the  same today, again with thick rubbery slough. 01/03/2022: The left medial ankle wound has closed again. Both right ankle wounds appear to be about the same size with thick rubbery slough. The dorsal foot wound on the right continues to be quite painful and she stated that she did not want any debridement of that site today. 01/10/2022: No  real change to any of her wounds. She continues to accumulate thick slough. The dorsal foot wound has merged with the lateral malleolar wound. She is experiencing significant pain in the dorsal foot portion of the ulcer. 01/16/2022: Absolutely no change or progress in her wounds. 01/24/2022: Her wounds are unchanged. She continues to build up slough and the wounds on her dorsal right foot are still exquisitely tender. 01/31/2022: The wounds actually measure a little bit narrower today. They still have thick slough on the surface but the underlying tissue seems a little less fibrotic. We changed to Iodosorb last week. 02/07/2022: Wounds continue to slowly epithelialize around the parameters. She has less pain today. She still accumulates a fairly substantial layer of slough. 02/15/2022: No change to the wounds overall. The measured a little bit larger today per the intake nurse. She continues to have substantial slough accumulation and her pain is a little bit worse. 02/21/2022. No change at all to her wounds. I do not see that plastic surgery has received her referral yet. 02/28/2022: The wounds remain unchanged. They are dry and fibrotic with accumulation of slough and eschar. She did receive an appointment to see plastic surgery on October 23, but the office called her back and indicated they needed to reschedule and that the next available appointment was not until December. The patient became angry and decided she did not want to see this plastics group. 10/19; the patient sees Dr. Lovett Calender again next week. She is using Medihoney as the primary dressing changing this  herself. 03/21/2022: She saw Dr. Jaclynn Guarneri at the wound care center at [facility]. Dr. Jaclynn Guarneri was also unable to debride her, secondary to pain and is planning to take her to the operating room for operative debridement and potential skin substitute placement. Her wounds are unchanged with thick yellow slough and a fibrotic base. She says they are too painful to be debrided. In addition, yesterday her hemoglobin was 4 and she received 2 units packed Marcil, Shelsea ([id number]) [id number] Page 10 of 14 red blood cell transfusion. 04/04/2022: Her operation at [facility] is scheduled for December 15. She continues to use Medihoney on her wounds. They are a little bit less painful today and she is willing to entertain the possibility of debridement. The wounds measured a little bit larger in all dimensions today but the layer of slough is not as thick as usual. 04/18/2022: Her wounds actually measured a little bit smaller today and the extension onto the dorsal foot from the lateral wound has healed. They are less tender and there is significantly less slough present as compared to prior visits. 05/09/2022: She had to reschedule her surgery due to lack of childcare. It is now planned for January 5. The wounds are basically unchanged, but she had a lot more drainage, which was thick and somewhat purulent in appearance. No significant odor. 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 They are quite painful today and she does not want to have debridement performed. 05/30/2022: The patient underwent surgical debridement and placement of TheraSkin to her wounds on January 5. She has been doing well and does not endorse any pain. The TheraSkin is intact and looks beautiful. It is sutured in place. There is no exudate or significant drainage. 06/04/2022: Her TheraSkin remains intact and I am starting to see granulation tissue emerging underneath the  surface. No concern for infection. Patient History Information obtained from Patient. Family History Diabetes - Mother, Lung Disease - Mother,  No family history of Cancer, Heart Disease, Hereditary Spherocytosis, Hypertension, Kidney Disease, Seizures, Stroke, Thyroid Problems, Tuberculosis. Social History Never [name], Marital Status - Married, Alcohol Use - Never, Drug Use - No History, Caffeine Use - Daily. Medical History Eyes Denies history of Cataracts, Glaucoma, Optic Neuritis Ear/Nose/Mouth/Throat Denies history of Chronic sinus problems/congestion, Middle ear problems Hematologic/Lymphatic Patient has history of Anemia, Sickle Cell Disease Denies history of Hemophilia, Human Immunodeficiency Virus, Lymphedema Respiratory Denies history of Aspiration, Asthma, Chronic Obstructive Pulmonary Disease (COPD), Pneumothorax, Sleep Apnea, Tuberculosis Cardiovascular Denies history of Angina, Arrhythmia, Congestive Heart Failure, Coronary Artery Disease, Deep Vein Thrombosis, Hypertension, Hypotension, Myocardial Infarction, Peripheral Arterial Disease, Peripheral Venous Disease, Phlebitis, Vasculitis Gastrointestinal Denies history of Cirrhosis , Colitis, Crohnoos, Hepatitis A, Hepatitis B, Hepatitis C Endocrine Denies history of Type I Diabetes, Type II Diabetes Genitourinary Denies history of End Stage Renal Disease Immunological Denies history of Lupus Erythematosus, Raynaudoos, Scleroderma Integumentary (Skin) Denies history of History of Burn Musculoskeletal Denies history of Gout, Rheumatoid Arthritis, Osteoarthritis, Osteomyelitis Neurologic Patient has history of Neuropathy - right foot intermittant Denies history of Dementia, Quadriplegia, Paraplegia, Seizure Disorder Oncologic Denies history of Received Chemotherapy, Received Radiation Psychiatric Denies history of Anorexia/bulimia, Confinement Anxiety Hospitalization/Surgery History - c section x2. - left breast  lumpectomy. - iandD right ankle with theraskin. Medical A Surgical History Notes nd Constitutional Symptoms (General Health) H/O miscarriage Cardiovascular bradycardia Gastrointestinal cholilithiasis Objective Constitutional no acute distress. Vitals Time Taken: 10:20 AM, Height: 67 in, Temperature: 98.1 F, Pulse: 98 bpm, Respiratory Rate: 16 breaths/min, Blood Pressure: 139/66 mmHg. Respiratory CHARLANN, WAYNE ([id number]) [id number] Page 11 of 14 Normal work of breathing on room air. General Notes: 06/04/2022: Her TheraSkin remains intact and I am starting to see granulation tissue emerging underneath the surface. No concern for infection. Integumentary (Hair, Skin) Wound #17 status is Open. Original cause of wound was Gradually Appeared. The date acquired was: 10/05/2012. The wound has been in treatment 59 weeks. The wound is located on the Right,Lateral Lower Leg. The wound measures 6.2cm length x 4.9cm width x 0.1cm depth; 23.86cm^2 area and 2.386cm^3 volume. There is Fat Layer (Subcutaneous Tissue) exposed. There is no tunneling or undermining noted. There is a medium amount of serosanguineous drainage noted. The wound margin is distinct with the outline attached to the wound base. There is small (1-33%) pink, pale granulation within the wound bed. There is a large (67-100%) amount of necrotic tissue within the wound bed including Adherent Slough. The periwound skin appearance had no abnormalities noted for moisture. The periwound skin appearance had no abnormalities noted for color. The periwound skin appearance exhibited: Scarring. Periwound temperature was noted as No Abnormality. Wound #21 status is Open. Original cause of wound was Gradually Appeared. The date acquired was: 06/26/2021. The wound has been in treatment 49 weeks. The wound is located on the Right,Medial Ankle. The wound measures 8cm length x 3cm width x 0.1cm depth; 18.85cm^2 area and  1.885cm^3 volume. There is Fat Layer (Subcutaneous Tissue) exposed. There is no tunneling or undermining noted. There is a medium amount of serosanguineous drainage noted. The wound margin is distinct with the outline attached to the wound base. There is small (1-33%) pink granulation within the wound bed. There is a large (67-100%) amount of necrotic tissue within the wound bed including Adherent Slough. The periwound skin appearance had no abnormalities noted for moisture. The periwound skin appearance had no abnormalities noted for color. The periwound skin appearance exhibited: Scarring. Periwound temperature was noted as No Abnormality.  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 - Dr. Celine Ahr - room 3 Cellular or Tissue Based Products: Other Cellular or Tissue Based Products Orders/Instructions: - Theraskin applied by Psychiatric nurse, leave on until further notice Bathing/ Shower/ Hygiene: Do not shower or bathe in tub. Edema Control - Lymphedema / SCD / Other: Avoid standing for long periods of time. Exercise regularly Additional Orders / Instructions: Follow Nutritious Diet WOUND #17: - Lower Leg Wound Laterality: Right, Lateral Cleanser: Soap and Water Every Other Day/30 Days Discharge Instructions: May shower and wash wound with dial antibacterial soap and water prior to dressing change. Cleanser: Wound Cleanser Every Other Day/30 Days Discharge Instructions: Cleanse the wound with wound cleanser prior to applying a clean dressing using gauze sponges, not tissue or cotton balls. Peri-Wound Care: Triamcinolone 15 (g) Every Other Day/30 Days Discharge Instructions: Use triamcinolone 15 (g) as directed Peri-Wound Care: Sween Lotion (Moisturizing lotion) Every Other Day/30 Days Discharge Instructions: Apply  moisturizing lotion as directed Prim Dressing: ADAPTIC TOUCH 3x4.25 (in/in) Every Other Day/30 Days ary Discharge Instructions: Apply to wound bed as instructed Secondary Dressing: ABD Pad, 5x9 Every Other Day/30 Days Discharge Instructions: Apply over primary dressing as directed. Secured With: Elastic Bandage 4 inch (ACE bandage) Every Other Day/30 Days Discharge Instructions: Secure with ACE bandage as directed. Com pression Wrap: Kerlix Roll 4.5x3.1 (in/yd) Every Other Day/30 Days Discharge Instructions: Apply Kerlix and Coban compression as directed. WOUND #21: - Ankle Wound Laterality: Right, Medial Cleanser: Soap and Water Every Other Day/30 Days Discharge Instructions: May shower and wash wound with dial antibacterial soap and water prior to dressing change. Cleanser: Wound Cleanser Every Other Day/30 Days Discharge Instructions: Cleanse the wound with wound cleanser prior to applying a clean dressing using gauze sponges, not tissue or cotton balls. Peri-Wound Care: Triamcinolone 15 (g) Every Other Day/30 Days Discharge Instructions: Use triamcinolone 15 (g) as directed Peri-Wound Care: Sween Lotion (Moisturizing lotion) Every Other Day/30 Days Discharge Instructions: Apply moisturizing lotion as directed Prim Dressing: ADAPTIC TOUCH 3x4.25 (in/in) Every Other Day/30 Days ary Discharge Instructions: Apply to wound bed as instructed Secondary Dressing: ABD Pad, 5x9 Every Other Day/30 Days Discharge Instructions: Apply over primary dressing as directed. Secured With: Elastic Bandage 4 inch (ACE bandage) Every Other Day/30 Days Discharge Instructions: Secure with ACE bandage as directed. Com pression Wrap: Kerlix Roll 4.5x3.1 (in/yd) Every Other Day/30 Days Discharge Instructions: Apply Kerlix and Coban compression as directed. 06/04/2022: Her TheraSkin remains intact and I am starting to see granulation tissue emerging underneath the surface. No concern for infection. DREW, HERMAN ([id number]) [id number] Page 12 of 14 No debridement performed. Continue to care for her TheraSkin grafts as directed by her plastic surgeon. Follow-up in 1 week for continued monitoring. Electronic Signature(s) Signed: 06/04/2022 10:56:25 AM By: Fredirick Maudlin MD FACS Entered By: Fredirick Maudlin on 06/04/2022 10:56:24 -------------------------------------------------------------------------------- HxROS Details Patient Name: Date of Service: [name] [name], [name] 06/04/2022 9:45 A M Medical Record Number: [id number] Patient Account Number: [id number] Date of Birth/Sex: Treating RN: November 06, 1970 (52 y.o. F) Primary Care Provider: [name] Other Clinician: Referring Provider: Treating Provider/Extender: Elyse Jarvis Weeks in Treatment: 32 Information Obtained From Patient Constitutional Symptoms (General Health) Medical History: Past Medical History Notes: H/O miscarriage Eyes Medical History: Negative for: Cataracts; Glaucoma; Optic Neuritis Ear/Nose/Mouth/Throat Medical History: Negative for: Chronic sinus problems/congestion; Middle ear problems Hematologic/Lymphatic  Medical History: Positive for: Anemia; Sickle Cell Disease Negative for: Hemophilia; Human Immunodeficiency Virus; Lymphedema Respiratory Medical History: Negative for: Aspiration; Asthma; Chronic Obstructive Pulmonary Disease (COPD); Pneumothorax; Sleep Apnea; Tuberculosis Cardiovascular Medical History: Negative for: Angina; Arrhythmia; Congestive Heart Failure; Coronary Artery Disease; Deep Vein Thrombosis; Hypertension; Hypotension; Myocardial Infarction; Peripheral Arterial Disease; Peripheral Venous Disease; Phlebitis; Vasculitis Past Medical History Notes: bradycardia Gastrointestinal Medical History: Negative for: Cirrhosis ; Colitis; Crohns; Hepatitis A; Hepatitis B; Hepatitis C Past Medical History Notes: cholilithiasis Endocrine Medical  History: Negative for: Type I Diabetes; Type II Diabetes Genitourinary [name] ([id number]) [id number] Page 13 of 14 Medical History: Negative for: End Stage Renal Disease Immunological Medical History: Negative for: Lupus Erythematosus; Raynauds; Scleroderma Integumentary (Skin) Medical History: Negative for: History of Burn Musculoskeletal Medical History: Negative for: Gout; Rheumatoid Arthritis; Osteoarthritis; Osteomyelitis Neurologic Medical History: Positive for: Neuropathy - right foot intermittant Negative for: Dementia; Quadriplegia; Paraplegia; Seizure Disorder Oncologic Medical History: Negative for: Received Chemotherapy; Received Radiation Psychiatric Medical History: Negative for: Anorexia/bulimia; Confinement Anxiety Immunizations Pneumococcal Vaccine: Received Pneumococcal Vaccination: No Implantable Devices None Hospitalization / Surgery History Type of Hospitalization/Surgery c section x2 left breast lumpectomy iandD right ankle with theraskin Family and Social History Cancer: No; Diabetes: Yes - Mother; Heart Disease: No; Hereditary Spherocytosis: No; Hypertension: No; Kidney Disease: No; Lung Disease: Yes - Mother; Seizures: No; Stroke: No; Thyroid Problems: No; Tuberculosis: No; Never [name]; Marital Status - Married; Alcohol Use: Never; Drug Use: No History; Caffeine Use: Daily; Financial Concerns: No; Food, Clothing or Shelter Needs: No; Support System Lacking: No; Transportation Concerns: No Electronic Signature(s) Signed: 06/04/2022 11:02:56 AM By: Fredirick Maudlin MD FACS Entered By: Fredirick Maudlin on 06/04/2022 10:53:21 -------------------------------------------------------------------------------- SuperBill Details Patient Name: Date of Service: [name] [name], [name] 06/04/2022 Medical Record Number: [id number] Patient Account Number: [id number] Date of Birth/Sex: Treating RN: November 15, 1970 (52 y.o. F) Primary  Care Provider: [name] Other Clinician: Referring Provider: Treating Provider/Extender: Elyse Jarvis Weeks in Treatment: [address] [place], Legrand Rams ([id number]) [id number] Page 14 of 14 ICD-10 Codes Code Description L97.818 Non-pressure chronic ulcer of other part of right lower leg with other specified severity 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 Facility Procedures : CPT4 Code: [id number] Description: 58099 - WOUND CARE VISIT-LEV 3 EST PT Modifier: 25 Quantity: 1 Physician Procedures : CPT4 Code Description Modifier [id number] - WC PHYS LEVEL 3 - EST PT ICD-10 Diagnosis Description L97.818 Non-pressure chronic ulcer of other part of right lower leg with other specified severity 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

## 2022-06-12 ENCOUNTER — Encounter (HOSPITAL_BASED_OUTPATIENT_CLINIC_OR_DEPARTMENT_OTHER): Payer: Medicaid Other | Admitting: General Surgery

## 2022-06-12 DIAGNOSIS — L97818 Non-pressure chronic ulcer of other part of right lower leg with other specified severity: Secondary | ICD-10-CM | POA: Diagnosis not present

## 2022-06-12 NOTE — Progress Notes (Signed)
ABENA, ERDMAN ([id number]) [id number] Page 1 of 14 Visit Report for 06/12/2022 Chief Complaint Document Details Patient Name: Date of Service: [name] [name] 06/12/2022 9:30 A M Medical Record Number: [id number] Patient Account Number: [id number] Date of Birth/Sex: Treating RN: 24-Aug-1970 (52 y.o. F) Primary Care Provider: [name] Other Clinician: Referring Provider: Treating Provider/Extender: Elyse Jarvis Weeks in Treatment: 44 Information Obtained from: Patient Chief Complaint the patient is here for evaluation of her bilateral lower extremity sickle cell ulcers 04/17/2021; patient comes in for substantial wounds on the right and left lower leg Electronic Signature(s) Signed: 06/12/2022 10:04:46 AM By: Fredirick Maudlin MD FACS Entered By: Fredirick Maudlin on 06/12/2022 10:04:46 -------------------------------------------------------------------------------- HPI Details Patient Name: Date of Service: [name] [name], [name] 06/12/2022 9:30 A M Medical Record Number: [id number] Patient Account Number: [id number] Date of Birth/Sex: Treating RN: 11-11-1970 (52 y.o. F) Primary Care Provider: [name] Other Clinician: Referring Provider: Treating Provider/Extender: Elyse Jarvis Weeks in Treatment: 60 History of Present Illness Location: medial and lateral ankle region on the right and left medial malleolus Quality: Patient reports experiencing a shooting pain to affected area(s). Severity: Patient states wound(s) are getting worse. Duration: right lower extremity bimalleolar ulcers have been present for approximately 2 years; the rright meedial malleolus ulcer has been there proximally 6 months Timing: Pain in wound is constant (hurts all the time) Context: The wound would happen gradually ssociated Signs and Symptoms: Patient reports having increase discharge. A HPI Description: 52 year old patient  with a history of sickle cell anemia who was last seen by me with ulceration of the right lower extremity above the ankle and was referred to Dr. Leland Johns for a surgical debridement as I was unable to do anything in the office due to excruciating pain. At that stage she was referred from the plastic surgery service to dermatology who treated her for a skin infection with doxycycline and then Levaquin and a local antibiotic ointment. I understand the patient has since developed ulceration on the left ankle both medial and lateral and was now referred back to the wound center as dermatology has finished the management. I do not have any notes from the dermatology department Old notes: 52 year old patient with a history of sickle cell anemia, pain bilateral lower extremities, right lower extremity ulcer and has a history of receiving a skin graft( Theraskin) several months ago. She has been visiting the wound center [facility] and was seen by Dr. Dellia Nims and Dr. Leland Johns. after prolonged conservator therapy between July 2016 and January 2017. She had been seen by the plastic surgeon and taken to the OR for debridement and application of Theraskin. She had 3 applications of Theraskin and was then treated with collagen. Prior to that she had a history of similar problems in 2014 and was treated conservatively. Had a reflux study done for the right lower extremity in August 2016 without reflux or DVT . Past medical history significant for sickle cell disease, anemia, leg ulcers, cholelithiasis,and has never been a smoker. Once the patient was discharged on the wound center she says within 2 or 3 weeks the problems recurred and she has been treating it conservatively. since I saw her 3 weeks ago at [facility] she has been unable to get her dressing material but has completed a course of doxycycline. 6/7/ 2017 -- lower extremity venous duplex reflux evaluation was done No evidence of SVT or DVT in the RLL. No  venous incompetence in the RLL. No further  vascular workup is indicated at this time. She was seen by Dr. [name], on 10/04/2015. She agreed with the plan of taking her to the OR for debridement and application of theraskin and would also take biopsies to rule out pyoderma gangrenosum. [name], [name] ([id number]) [id number] Page 2 of 14 Follow-up note dated May 31 received and she was status post application of Theraskin to multiple ulcers around the right ankle. Pathology did not show evidence of malignancy or pyoderma gangrenosum. She would continue to see as in the wound clinic for further care and see Dr. Leland Johns as needed. The patient brought the biopsy report and it was consistent with stasis ulcer no evidence of malignancy and the comment was that there was some adjacent neovascularization, fibrosis and patchy perivascular chronic inflammation. 11/15/2015 -- today we applied her first application of Theraskin 11/30/15; TheraSkin #2 12/13/2015 -- she is having a lot of pain locally and is here for possible application of a theraskin today. 01/16/2016 -- the patient has significant pain and has noticed despite in spite of all local care and oral pain medication. It is impossible to debride her in the office. 02/06/2016 -- I do not see any notes from Dr. Iran Planas( the patient has not made a call to the office know as she heard from them) and the only visit to recently was with her PCP Dr. Danella Penton -- I saw her on 01/16/2016 and prescribed 90 tablets of oxycodone 10 mg and did lab work and screening for HIV. the HIV was negative and hemoglobin was 6.3 with a WBC count of 14.9 and hematocrit of 17.8 with platelets of 561. 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 The bimalleolar ulcers to the right lower extremity have been present for  approximately 2 years, the left medial malleolus ulcer has been present approximately 6 months. She has followed with Dr.Thimmappa in the past and has had a total of 3 applications of Theraskin (01/2015, 09/2015, 06/17/16). She has also followed with Dr. Con Memos here in the clinic and has received 2 applications of TheraSkin (11/10/15, 11/30/15). The patient does experience chronic, and is not amenable to debridement. 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 She is not currently on any antibiotic therapy and has not been treated with any recently. 07/17/2016 -- was seen by Dr. Iran Planas of plastic surgery who saw her 2 weeks postop application of Theraskin #3. She had removed her dressing and asked her to apply silver alginate on alternate days and follow-up back with the wound center. Future debridements and application of skin substitute would have to be done in the hospital due to her high risk for anesthesia. READMISSION 04/17/2021 Patient is now a 52 year old woman that we have had in this clinic for a prolonged period of time and 2016-2017 and then again for 2 visits in February 2018. At that point she had wounds on the right lower leg predominantly medial. She had also been seen by plastic surgery Dr. Leland Johns who I believe took her to the OR for operative debridement and application of TheraSkin in 2017. After she left our clinic she was followed for a very prolonged period of time in the wound care center in [facility] who then referred her ultimately to [facility] where she was seen by Dr. Vernona Rieger. Again taken her to the OR for skin grafting which apparently did  not take. She had multiple other attempts at dressings although I have not really looked over all of these notes in great detail. She has not been seen in a wound care center in about a year. She states over the last year in addition to her right lower leg she has developed wounds on the left lower leg  quite extensive. She is using Xeroform to all of these wounds without really any improvement. She also has Medicaid which does not cover wound products. The patient has had vascular work-ups in the past including most recently on 03/28/2021 showing biphasic waveforms on the right triphasic at the PTA and biphasic at the dorsalis pedis on the left. She was unable to tolerate any degree of compression to do ABIs. Unfortunately TBI's were also not done. She had venous reflux studies done in 2017. This did not show any evidence of a DVT or SVT and no venous incompetence was noted in the right leg at the time this was the only side with the wound As noted I did not look all over her old records. She apparently had a course of HBO and [facility] although I am not sure what the indication would have been. In any case she developed seizures and terminated treatment earlier. She is generally much more disabled than when we last saw her in clinic. She can no longer walk pretty much wheelchair-bound because predominantly of pain in the left hip. 04/24/2021; the patient tolerated the wraps we put on. We used Santyl and Hydrofera Blue under compression. I brought her back for a nurse visit for a change in dressing. With Medicaid we will have a hard time getting anything paid for and hence the need for compression. She arrives in clinic with all the wounds looking somewhat better in terms of surface 12/20; circumferential wound on the right from the lateral to the medial. She has open areas on the left medial and left lateral x2 on all of this with the same surface. This does not look completely healthy although she does have some epithelialization. She is not complaining of a lot of pain which is unusual for her sickle ulcers. I have not looked over her extensive records from [facility]. She had recent arterial studies and has a history of venous reflux studies I will need to look these over although I do not believe she  has significant arterial disease 2023 05/22/2021; patient's wound areas measure slightly smaller. Still a lot of drainage coming from the right we have been using Hydrofera Blue and Santyl with some improvement in the wound surfaces. She tells me she will be getting transfused later in the week for her underlying sickle cell anemia I have looked over her recent arterial studies which were done in the fall. This was in November and showed biphasic and triphasic waveforms but she could not tolerate ABIs because of pressure and unfortunately TBI's were not done. She has not had recent venous reflux studies that I can see 1/10; not much change about the same surface area. This has a yellowish surface to it very gritty. We have been using Santyl and Hydrofera Blue for a prolonged period. Culture I did last week showed methicillin sensitive staph aureus "rare". Our intake nurse reports greenish drainage which may be the Hydrofera Blue itself 1/17; wounds are continue to measure smaller although I am not sure about the accuracy here. Especially the areas on the right are covered in what looks to be a nonviable surface although she  does have some epithelialization. Similarly she has areas on the left medial and left lateral ankle area which appear to have a better surface and perhaps are slightly smaller. We have been using Santyl and Hydrofera Blue. She cannot tolerate mechanical debridements She went for her reflux studies which showed significant reflux at the greater saphenous vein at the saphenofemoral junction as well as the greater saphenous vein in the proximal calf on the left she had reflux in the thigh and the common femoral vein and supra vein Fishel vein reflux in the greater saphenous vein. I will have vein and vascular look at this. My thoughts have been that these are likely sickle wounds. I looked through her old records from [facility] wound care center and then when she graduated to [facility] wound care center where she saw Dr. Zigmund Daniel and Dr. Vernona Rieger. Although I can see she had reflux studies done I do not see that she actually saw a vein and vascular. I went over the fact that she had operative debridements and actual skin grafting that did not take. I do not think these wounds have ever really progressed towards healing 1/31;Substantial wounds on the right ankle area. Hyper granulated very gritty adherent debris on the surface. 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 On the left she has no DVT or SVT deep vein reflux in the common femoral vein. Superficial vein reflux in the greater saphenous vein on although the vein diameter is not really all that large. I do not think there is anything that can be done with these although I am going to send her for consultation to vein and vascular. 2/7; Wound exam; substantial wound area on the right posterior ankle area and areas on the left medial ankle and left lateral ankle. I was able to debride the left medial ankle last week fairly aggressively and it is back this week to a completely nonviable surface She will see vascular surgery this Friday and I would like them to review the venous studies and also any comments on her arterial status. If they do not see an issue here I am going to refer her to plastic surgery for an operative debridement perhaps intraoperative ACell or Integra. Eventually she will require a deep AHAANA, ROCHETTE ([id number]) [id number] Page 3 of 14 tissue culture again 2/14; substantial wound area on the right posterior  ankle, medial ankle. We have been using silver alginate The patient was seen by vein and vascular she had both venous reflux studies and arterial studies. 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 She had no evidence of superficial venous thrombosis. She did have superficial vein reflux at the saphenofemoral junction and the greater saphenous vein. On the left no evidence of a DVT no evidence of superficial venous throat thrombosis she did have deep vein reflux in the common femoral vein and superficial vein reflux in the greater saphenous vein but these were not felt to be amenable to ablation. In terms of arterial studies she had triphasic and wife  biphasic wave waveforms bilaterally not felt to have a significant arterial issue. I do not get the feeling that they felt that any part of her nonhealing wounds were related to either arterial or venous issues. They did note that she had venous reflux at the right at the [facility] and [facility]. And also on the left there were reflux in the deep system at the common femoral vein and greater saphenous vein in the proximal thigh. Nothing amenable to ablation. 2/20; she is making some decent progress on the right where there is nice skin between the 2 open areas on the right ankle. The surfaces here do not look viable yet there is some surrounding epithelialization. She still has a small area on the left medial ankle area. Hyper-granulated Jody's away always 2/28 patient has an appointment with plastic surgery on 3/8. We will see her back on 3/9. She may have to call us to get the area redressed. We've been using Santyl under silver alginate. We made a nice improvement on the left medial ankle. The larger wounds on the right also looks somewhat better in terms of epithelialization although I think they could benefit from an aggressive debridement if plastic surgery would be willing to do that.  Perhaps placement of Integra or a cell 07/26/2021: She saw Dr. Claudia Desanctis yesterday. He raised the question as to whether or not this might be pyoderma and wanted to wait until that question was answered by dermatology before proceeding with any sort of operative debridement. We have continue to use Santyl under silver alginate with Kerlix and Coban wraps. Overall, her wounds appear to be continuing to contract and epithelialize, with some granulation tissue present. There continues to be some slough on all wound surfaces. 08/09/2021: She has not been able to get an appointment with dermatology because apparently the offices in [place] do not accept Medicaid. She is looking into whether or not she can be seen at the main [facility] dermatology clinic. This is necessary because plastic surgery is concerned that her wounds might represent pyoderma and they did not want to do any procedure until that was clarified. We have been using Santyl under silver alginate with Kerlix and Coban wraps. Today, there was a greater amount of drainage on her dressings with a slight green discoloration and significant odor. Despite this, her wounds continue to contract and epithelialize. There is pale granulation tissue present and actually, on the left medial ankle, the granulation tissue is a bit hypertrophic. 08/16/2021: Last week, I took a culture and this grew back rare methicillin-resistant Staph aureus and rare corynebacterium. The MRSA was sensitive to gentamicin which we began applying topically on an empiric basis. This week, her wounds are a bit smaller and the drainage and odor are less. Her primary care provider is working on assisting the patient with a dermatology evaluation. She has been in silver alginate over the gentamicin that was started last week along with Kerlix and Coban wraps. 08/23/2021: Because she has Medicaid, we have been unable to get her into see any dermatologist in the  [place] to rule out pyoderma gangrenosum, which was a requirement from plastic surgery prior to any sort of debridement and grafting. Despite this, however, all of her wounds continue to get smaller. The wound on her left medial ankle is nearly closed. There is no odor from the wounds, although she still accumulates a modest amount of drainage on her dressings. 08/30/2021: The lateral right ankle wound and the  medial left ankle wound are a bit smaller today. The medial right ankle wound is about the same size. They are less tender. We have still been unable to get her into dermatology. 09/06/2021: All of the wounds are about the same size today. She continues to endorse minimal pain. I communicated with Dr. Claudia Desanctis in plastic surgery regarding our issues getting a dermatology appointment; he was out of town but indicated that he would look into perhaps performing the biopsy in his office and will have his office contact her. 09/14/2021: The patient has an appointment in dermatology, but it is not until October. Her wounds are roughly the same; she continues to have very thick purulent-looking drainage on her dressings. 09/20/2021: The left medial wound is nearly closed and just has a bit of accumulated eschar on the surface. The right medial and lateral ankle wounds are perhaps a little bit smaller. They continue to have a very pale surface with accumulation of thin slough. 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 I did not think [employer] was indicated based on this. She is getting topical mupirocin with Prisma silver collagen. 10/04/2021: The patient was not seen in clinic last week due to childcare coverage issues. In the interim, the left medial leg wound has closed. The right sided leg wounds are smaller. There is more granulation tissue coming through, particularly on the lateral wound. The surface remains somewhat gritty. We have been applying topical mupirocin and Prisma silver  collagen. 10/11/2021: The left medial leg wound remains closed. She does complain of some anesthetic sensation to the area. Both of the right-sided leg wounds are smaller but still have accumulated slough. 10/18/2021: Both right-sided leg wounds are minimally smaller this week. She still continues to accumulate slough and has thick drainage on her dressings. 10/23/2021: Both wounds continue to contract. There is still slough buildup. She has been approved for a keratin-based skin substitute trial product but it will not be available until next week. 10/30/2021: The wounds are about the same to perhaps slightly smaller. There is still continued slough buildup. Unfortunately, the rep for the keratin based product did not show up today and did not answer his phone when called. 11/08/2021: The wounds are little bit smaller today. She continues to have thick drainage but the surfaces are relatively clean with just a little bit of slough accumulation. She reported to me today that she is unable to completely flex her left ankle and on examination it seems this is potentially related to scar tissue from her wounds. We do have the ProgenaMatrix trial product available for her today. 11/15/2021: Both wounds are smaller today. There is some slough accumulation on the surfaces, but the medial wound, in particular looks like it is filling in and is less deep. She did hear from physical therapy and she is going to start working with them on July 11. She is here for her second application of the trial skin substitute, ProgenaMatrix. 11/22/2021: Both wounds continue to contract, the medial more dramatically than the lateral. Both wounds have a layer of slough on the surface, but underneath this, the gritty fibrous tissue has a little bit more of a pink cast to it rather than being as pale as it has been. 11/29/2021: The wounds are roughly the same size this week, perhaps a millimeter or 2 smaller. The medial wound has filled  in and is nearly flush with the surrounding skin surface. She continues to have a lot of  slough accumulation on both surfaces. 12/06/2021: No significant change to her wounds, but she has a new opening on her dorsal foot, just distal to the right lateral ankle wound. The area on her left medial ankle that reopened looks a little bit larger today. She has quite a bit of pain associated with the new wound. 12/12/2021: Her wounds look about the same but the new opening on her right lateral dorsal foot is a little bit bigger. She continues to have a fair amount of pain with this wound. 12/26/2021: The left medial ankle wound is tiny and superficial. She has 2 areas of crusting on her left lateral ankle, however, that appear to be threatening to SILENA, WYSS ([id number]) [phone].pdf Page 4 of 14 open again. Her right medial ankle wound is a little bit smaller today but still continues to accumulate thick rubbery slough. The new dorsal foot wound is exquisitely painful but there is no odor or purulent drainage. No erythema or induration. The right lateral ankle wound looks about the same today, again with thick rubbery slough. 01/03/2022: The left medial ankle wound has closed again. Both right ankle wounds appear to be about the same size with thick rubbery slough. The dorsal foot wound on the right continues to be quite painful and she stated that she did not want any debridement of that site today. 01/10/2022: No real change to any of her wounds. She continues to accumulate thick slough. The dorsal foot wound has merged with the lateral malleolar wound. She is experiencing significant pain in the dorsal foot portion of the ulcer. 01/16/2022: Absolutely no change or progress in her wounds. 01/24/2022: Her wounds are unchanged. She continues to build up slough and the wounds on her dorsal right foot are still exquisitely tender. 01/31/2022: The wounds actually measure a little bit  narrower today. They still have thick slough on the surface but the underlying tissue seems a little less fibrotic. We changed to Iodosorb last week. 02/07/2022: Wounds continue to slowly epithelialize around the parameters. She has less pain today. She still accumulates a fairly substantial layer of slough. 02/15/2022: No change to the wounds overall. The measured a little bit larger today per the intake nurse. She continues to have substantial slough accumulation and her pain is a little bit worse. 02/21/2022. No change at all to her wounds. I do not see that plastic surgery has received her referral yet. 02/28/2022: The wounds remain unchanged. They are dry and fibrotic with accumulation of slough and eschar. She did receive an appointment to see plastic surgery on October 23, but the office called her back and indicated they needed to reschedule and that the next available appointment was not until December. The patient became angry and decided she did not want to see this plastics group. 10/19; the patient sees Dr. Lovett Calender again next week. She is using Medihoney as the primary dressing changing this herself. 03/21/2022: She saw Dr. Jaclynn Guarneri at the wound care center at [facility]. Dr. Jaclynn Guarneri was also unable to debride her, secondary to pain and is planning to take her to the operating room for operative debridement and potential skin substitute placement. Her wounds are unchanged with thick yellow slough and a fibrotic base. She says they are too painful to be debrided. In addition, yesterday her hemoglobin was 4 and she received 2 units packed red blood cell transfusion. 04/04/2022: Her operation at [facility] is scheduled for December 15. She continues to use Medihoney on her  wounds. They are a little bit less painful today and she is willing to entertain the possibility of debridement. The wounds measured a little bit larger in all dimensions today but the layer of slough is  not as thick as usual. 04/18/2022: Her wounds actually measured a little bit smaller today and the extension onto the dorsal foot from the lateral wound has healed. They are less tender and there is significantly less slough present as compared to prior visits. 05/09/2022: She had to reschedule her surgery due to lack of childcare. It is now planned for January 5. The wounds are basically unchanged, but she had a lot more drainage, which was thick and somewhat purulent in appearance. No significant odor. 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 They are quite painful today and she does not want to have debridement performed. 05/30/2022: The patient underwent surgical debridement and placement of TheraSkin to her wounds on January 5. She has been doing well and does not endorse any pain. The TheraSkin is intact and looks beautiful. It is sutured in place. There is no exudate or significant drainage. 06/04/2022: Her TheraSkin remains intact and I am starting to see granulation tissue emerging underneath the surface. No concern for infection. 06/12/2022: The TheraSkin is intact and there is more granulation tissue emerging. It is starting to look a little bit dry, however. No malodor or purulent drainage. Electronic Signature(s) Signed: 06/12/2022 10:05:16 AM By: Fredirick Maudlin MD FACS Entered By: Fredirick Maudlin on 06/12/2022 10:05:16 -------------------------------------------------------------------------------- Physical Exam Details Patient Name: Date of Service: [name] [name], [name] 06/12/2022 9:30 A M Medical Record Number: [id number] Patient Account Number: [id number] Date of Birth/Sex: Treating RN: May 09, 1971 (52 y.o. F) Primary Care Provider: [name] Other Clinician: Referring Provider: Treating Provider/Extender: Elyse Jarvis Weeks in Treatment: 5 Constitutional . . . . no acute distress. Respiratory Normal work of breathing on room  air. Notes 06/12/2022: The TheraSkin is intact and there is more granulation tissue emerging. It is starting to look a little bit dry, however. No malodor or purulent drainage. LEILY, CAPEK ([id number]) [id number] Page 5 of 14 Electronic Signature(s) Signed: 06/12/2022 10:05:42 AM By: Fredirick Maudlin MD FACS Entered By: Fredirick Maudlin on 06/12/2022 10:05:41 -------------------------------------------------------------------------------- Physician Orders Details Patient Name: Date of Service: [name] [name], [name] 06/12/2022 9:30 A M Medical Record Number: [id number] Patient Account Number: [id number] Date of Birth/Sex: Treating RN: 14-Dec-1970 (52 y.o. [name] Primary Care Provider: [name] Other Clinician: Referring Provider: Treating Provider/Extender: Elyse Jarvis Weeks in Treatment: 21 Verbal / Phone Orders: No Diagnosis Coding ICD-10 Coding Code Description L97.818 Non-pressure chronic ulcer of other part of right lower leg with other specified severity 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 Follow-up Appointments ppointment in 1 week. - Dr. Celine Ahr - room 3 Return A Cellular or Tissue Based Products Other Cellular or Tissue Based Products Orders/Instructions: - Theraskin applied by plastic surgeon, leave on until further notice Bathing/ Shower/ Hygiene Do not shower or bathe in tub. Edema Control - Lymphedema / SCD / Other Avoid standing for long periods of time. Exercise regularly Additional Orders / Instructions Follow Nutritious Diet Wound Treatment Wound #17 - Lower Leg Wound Laterality: Right, Lateral Cleanser: Soap and Water Every Other Day/30 Days Discharge Instructions: May shower and wash wound with dial antibacterial soap and water prior to dressing change. Cleanser: Wound Cleanser Every Other Day/30 Days Discharge Instructions: Cleanse the wound with wound cleanser prior to applying a clean  dressing using gauze  sponges, not tissue or cotton balls. Peri-Wound Care: Triamcinolone 15 (g) Every Other Day/30 Days Discharge Instructions: Use triamcinolone 15 (g) as directed Peri-Wound Care: Sween Lotion (Moisturizing lotion) Every Other Day/30 Days Discharge Instructions: Apply moisturizing lotion as directed Topical: Skintegrity Hydrogel 4 (oz) Every Other Day/30 Days Discharge Instructions: Apply hydrogel as directed Prim Dressing: [place] 3x4.25 (in/in) Every Other Day/30 Days ary Discharge Instructions: Apply to wound bed as instructed Secondary Dressing: ABD Pad, 5x9 Every Other Day/30 Days Discharge Instructions: Apply over primary dressing as directed. Secured With: Elastic Bandage 4 inch (ACE bandage) Every Other Day/30 Days Discharge Instructions: Secure with ACE bandage as directed. Compression Wrap: Kerlix Roll 4.5x3.1 (in/yd) Every Other Day/30 Days Discharge Instructions: Apply Kerlix and Coban compression as directed. Wound #21 - Ankle Wound Laterality: Right, Medial OPAL, DINNING ([id number]) [id number] Page 6 of 14 Cleanser: Soap and Water Every Other Day/30 Days Discharge Instructions: May shower and wash wound with dial antibacterial soap and water prior to dressing change. Cleanser: Wound Cleanser Every Other Day/30 Days Discharge Instructions: Cleanse the wound with wound cleanser prior to applying a clean dressing using gauze sponges, not tissue or cotton balls. Peri-Wound Care: Triamcinolone 15 (g) Every Other Day/30 Days Discharge Instructions: Use triamcinolone 15 (g) as directed Peri-Wound Care: Sween Lotion (Moisturizing lotion) Every Other Day/30 Days Discharge Instructions: Apply moisturizing lotion as directed Prim Dressing: ADAPTIC TOUCH 3x4.25 (in/in) Every Other Day/30 Days ary Discharge Instructions: Apply to wound bed as instructed Secondary Dressing: ABD Pad, 5x9 Every Other Day/30 Days Discharge  Instructions: Apply over primary dressing as directed. Secured With: Elastic Bandage 4 inch (ACE bandage) Every Other Day/30 Days Discharge Instructions: Secure with ACE bandage as directed. Compression Wrap: Kerlix Roll 4.5x3.1 (in/yd) Every Other Day/30 Days Discharge Instructions: Apply Kerlix and Coban compression as directed. Electronic Signature(s) Signed: 06/12/2022 10:27:15 AM By: Fredirick Maudlin MD FACS Signed: 06/12/2022 5:02:56 PM By: Sharyn Creamer RN, BSN Entered By: Sharyn Creamer on 06/12/2022 10:09:05 -------------------------------------------------------------------------------- Problem List Details Patient Name: Date of Service: [name] [name], [name] 06/12/2022 9:30 A M Medical Record Number: [id number] Patient Account Number: [id number] Date of Birth/Sex: Treating RN: 1971/02/09 (52 y.o. F) Primary Care Provider: [name] Other Clinician: Referring Provider: Treating Provider/Extender: Elyse Jarvis Weeks in Treatment: 85 Active Problems ICD-10 Encounter Code Description Active Date MDM Diagnosis L97.818 Non-pressure chronic ulcer of other part of right lower leg with other specified 04/17/2021 No Yes severity 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 04/17/2021 No Yes Inactive Problems ICD-10 Code Description Active Date Inactive Date L97.828 Non-pressure chronic ulcer of other part of left lower leg with other specified severity 04/17/2021 04/17/2021 Resolved Problems Electronic Signature(s) MARYKATHERINE, SHERWOOD ([id number]) [id number] Page 7 of 14 Signed: 06/12/2022 10:03:23 AM By: Fredirick Maudlin MD FACS Entered By: Fredirick Maudlin on 06/12/2022 10:03:23 -------------------------------------------------------------------------------- Progress Note Details Patient Name: Date of Service: [name] [name], [name] 06/12/2022 9:30 A M Medical Record Number: [id number] Patient Account Number: [id number] Date of Birth/Sex:  Treating RN: 1970/11/06 (52 y.o. F) Primary Care Provider: [name] Other Clinician: Referring Provider: Treating Provider/Extender: Elyse Jarvis Weeks in Treatment: 48 Subjective Chief Complaint Information obtained from Patient the patient is here for evaluation of her bilateral lower extremity sickle cell ulcers 04/17/2021; patient comes in for substantial wounds on the right and left lower leg History of Present Illness (HPI) The following HPI elements were documented for the patient's wound: Location: medial and lateral ankle region on the right and left medial malleolus Quality:  Patient reports experiencing a shooting pain to affected area(s). Severity: Patient states wound(s) are getting worse. Duration: right lower extremity bimalleolar ulcers have been present for approximately 2 years; the rright meedial malleolus ulcer has been there proximally 6 months Timing: Pain in wound is constant (hurts all the time) Context: The wound would happen gradually Associated Signs and Symptoms: Patient reports having increase discharge. 52 year old patient with a history of sickle cell anemia who was last seen by me with ulceration of the right lower extremity above the ankle and was referred to Dr. Leland Johns for a surgical debridement as I was unable to do anything in the office due to excruciating pain. At that stage she was referred from the plastic surgery service to dermatology who treated her for a skin infection with doxycycline and then Levaquin and a local antibiotic ointment. I understand the patient has since developed ulceration on the left ankle both medial and lateral and was now referred back to the wound center as dermatology has finished the management. I do not have any notes from the dermatology department Old notes: 52 year old patient with a history of sickle cell anemia, pain bilateral lower extremities, right lower extremity ulcer and has a  history of receiving a skin graft( Theraskin) several months ago. She has been visiting the wound center [facility] and was seen by Dr. Dellia Nims and Dr. Leland Johns. after prolonged conservator therapy between July 2016 and January 2017. She had been seen by the plastic surgeon and taken to the OR for debridement and application of Theraskin. She had 3 applications of Theraskin and was then treated with collagen. Prior to that she had a history of similar problems in 2014 and was treated conservatively. Had a reflux study done for the right lower extremity in August 2016 without reflux or DVT . Past medical history significant for sickle cell disease, anemia, leg ulcers, cholelithiasis,and has never been a smoker. Once the patient was discharged on the wound center she says within 2 or 3 weeks the problems recurred and she has been treating it conservatively. since I saw her 3 weeks ago at [facility] she has been unable to get her dressing material but has completed a course of doxycycline. 6/7/ 2017 -- lower extremity venous duplex reflux evaluation was done oo No evidence of SVT or DVT in the RLL. No venous incompetence in the RLL. No further vascular workup is indicated at this time. She was seen by Dr. [name], on 10/04/2015. She agreed with the plan of taking her to the OR for debridement and application of theraskin and would also take biopsies to rule out pyoderma gangrenosum. Follow-up note dated May 31 received and she was status post application of Theraskin to multiple ulcers around the right ankle. Pathology did not show evidence of malignancy or pyoderma gangrenosum. She would continue to see as in the wound clinic for further care and see Dr. Leland Johns as needed. The patient brought the biopsy report and it was consistent with stasis ulcer no evidence of malignancy and the comment was that there was some adjacent neovascularization, fibrosis and patchy perivascular chronic  inflammation. 11/15/2015 -- today we applied her first application of Theraskin 11/30/15; TheraSkin #2 12/13/2015 -- she is having a lot of pain locally and is here for possible application of a theraskin today. 01/16/2016 -- the patient has significant pain and has noticed despite in spite of all local care and oral pain medication. It is impossible to debride her in the office. 02/06/2016 --  I do not see any notes from Dr. Iran Planas( the patient has not made a call to the office know as she heard from them) and the only visit to recently was with her PCP Dr. Danella Penton -- I saw her on 01/16/2016 and prescribed 90 tablets of oxycodone 10 mg and did lab work and screening for HIV. the HIV was negative and hemoglobin was 6.3 with a WBC count of 14.9 and hematocrit of 17.8 with platelets of 561. 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 The bimalleolar ulcers to the right lower extremity have been present for approximately 2 years, the left medial malleolus ulcer has been present approximately 6 months. She has followed with Dr.Thimmappa in the past and has had a total of 3 applications of Theraskin (01/2015, 09/2015, 06/17/16). She has also followed with Dr. Con Memos here in the clinic and has received 2 applications of TheraSkin (11/10/15, 11/30/15). The patient does experience chronic, and is not amenable to debridement. 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 She is not currently on any antibiotic therapy and has not been treated with any recently. 07/17/2016 -- was seen by Dr. Iran Planas of plastic surgery who saw her 2 weeks postop application of Theraskin #3. She had removed her dressing and asked her to apply silver alginate on alternate days and follow-up back with the wound center. Future debridements and application of skin substitute  would have to be done in the hospital due to her high risk for anesthesia. READMISSION 04/17/2021 [name] ([id number]) [id number] Page 8 of 14 Patient is now a 52 year old woman that we have had in this clinic for a prolonged period of time and 2016-2017 and then again for 2 visits in February 2018. At that point she had wounds on the right lower leg predominantly medial. She had also been seen by plastic surgery Dr. Leland Johns who I believe took her to the OR for operative debridement and application of TheraSkin in 2017. After she left our clinic she was followed for a very prolonged period of time in the wound care center in [facility] who then referred her ultimately to [facility] where she was seen by Dr. Vernona Rieger. Again taken her to the OR for skin grafting which apparently did not take. She had multiple other attempts at dressings although I have not really looked over all of these notes in great detail. She has not been seen in a wound care center in about a year. She states over the last year in addition to her right lower leg she has developed wounds on the left lower leg quite extensive. She is using Xeroform to all of these wounds without really any improvement. She also has Medicaid which does not cover wound products. The patient has had vascular work-ups in the past including most recently on 03/28/2021 showing biphasic waveforms on the right triphasic at the PTA and biphasic at the dorsalis pedis on the left. She was unable to tolerate any degree of compression to do ABIs. Unfortunately TBI's were also not done. She had venous reflux studies done in 2017. This did not show any evidence of a DVT or SVT and no venous incompetence was noted in the right leg at the time this was the only side with the wound As noted I did not look all over her old records. She apparently had a course  of HBO and [facility] although I am not sure what the indication would have been.  In any case she developed seizures and terminated treatment earlier. She is generally much more disabled than when we last saw her in clinic. She can no longer walk pretty much wheelchair-bound because predominantly of pain in the left hip. 04/24/2021; the patient tolerated the wraps we put on. We used Santyl and Hydrofera Blue under compression. I brought her back for a nurse visit for a change in dressing. With Medicaid we will have a hard time getting anything paid for and hence the need for compression. She arrives in clinic with all the wounds looking somewhat better in terms of surface 12/20; circumferential wound on the right from the lateral to the medial. She has open areas on the left medial and left lateral x2 on all of this with the same surface. This does not look completely healthy although she does have some epithelialization. She is not complaining of a lot of pain which is unusual for her sickle ulcers. I have not looked over her extensive records from [facility]. She had recent arterial studies and has a history of venous reflux studies I will need to look these over although I do not believe she has significant arterial disease 2023 05/22/2021; patient's wound areas measure slightly smaller. Still a lot of drainage coming from the right we have been using Hydrofera Blue and Santyl with some improvement in the wound surfaces. She tells me she will be getting transfused later in the week for her underlying sickle cell anemia I have looked over her recent arterial studies which were done in the fall. This was in November and showed biphasic and triphasic waveforms but she could not tolerate ABIs because of pressure and unfortunately TBI's were not done. She has not had recent venous reflux studies that I can see 1/10; not much change about the same surface area. This has a yellowish surface to it very gritty. We have been using Santyl and Hydrofera Blue for a prolonged period. Culture I  did last week showed methicillin sensitive staph aureus "rare". Our intake nurse reports greenish drainage which may be the Hydrofera Blue itself 1/17; wounds are continue to measure smaller although I am not sure about the accuracy here. Especially the areas on the right are covered in what looks to be a nonviable surface although she does have some epithelialization. Similarly she has areas on the left medial and left lateral ankle area which appear to have a better surface and perhaps are slightly smaller. We have been using Santyl and Hydrofera Blue. She cannot tolerate mechanical debridements She went for her reflux studies which showed significant reflux at the greater saphenous vein at the saphenofemoral junction as well as the greater saphenous vein in the proximal calf on the left she had reflux in the thigh and the common femoral vein and supra vein Fishel vein reflux in the greater saphenous vein. I will have vein and vascular look at this. My thoughts have been that these are likely sickle wounds. I looked through her old records from [facility] wound care center and then when she graduated to [facility] wound care center where she saw Dr. Zigmund Daniel and Dr. Vernona Rieger. Although I can see she had reflux studies done I do not see that she actually saw a vein and vascular. I went over the fact that she had operative debridements and actual skin grafting that did not take. I do not think  these wounds have ever really progressed towards healing 1/31;Substantial wounds on the right ankle area. Hyper granulated very gritty adherent debris on the surface. 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 On the left she has no DVT or SVT deep vein reflux in the common femoral vein. Superficial vein reflux in the greater saphenous vein on although the vein diameter is not really all that large. I do not think there is anything that can be done with these although I am going to send her for consultation to vein and vascular. 2/7; Wound exam; substantial wound area on the right posterior ankle area and areas on the left medial ankle and left lateral ankle. I was able to debride the left medial ankle last week fairly aggressively and it is back this week to a completely nonviable surface She will see vascular surgery this Friday and I would like them to review the venous studies and also any comments on her arterial status. If they do not see an issue here I am going to refer her to plastic surgery for an operative debridement perhaps intraoperative ACell or Integra. Eventually she will require a deep tissue culture again 2/14; substantial wound area on the right posterior ankle, medial ankle. We have been using silver alginate The patient was seen by vein and vascular she had both venous reflux studies and arterial studies. 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 She had no evidence of superficial venous thrombosis. She did have superficial vein reflux at the saphenofemoral junction and the greater saphenous vein. On the left no evidence of a DVT no evidence of superficial venous throat thrombosis she did have deep vein reflux in the common femoral vein and superficial vein reflux in the greater saphenous vein but these were not felt to be amenable to ablation. In terms of arterial studies she had triphasic and wife biphasic wave waveforms bilaterally not felt to have a significant arterial issue. I do not get the feeling that they felt that any part of her nonhealing  wounds were related to either arterial or venous issues. They did note that she had venous reflux at the right at the [facility] and [facility]. And also on the left there were reflux in the deep system at the common femoral vein and greater saphenous vein in the proximal thigh. Nothing amenable to ablation. 2/20; she is making some decent progress on the right where there is nice skin between the 2 open areas on the right ankle. The surfaces here do not look viable yet there is some surrounding epithelialization. She still has a small area on the left medial ankle area. Hyper-granulated Jody's away always 2/28 patient has an appointment with plastic surgery on 3/8. We will see her back on 3/9. She may have to call us to get the area redressed. We've been using Santyl under silver alginate. We made a nice improvement on the left medial ankle. The larger wounds on the right also looks somewhat better in terms of epithelialization although I think they could benefit  from an aggressive debridement if plastic surgery would be willing to do that. Perhaps placement of Integra or a cell 07/26/2021: She saw Dr. Claudia Desanctis yesterday. He raised the question as to whether or not this might be pyoderma and wanted to wait until that question was answered by dermatology before proceeding with any sort of operative debridement. We have continue to use Santyl under silver alginate with Kerlix and Coban wraps. Overall, her wounds appear to be continuing to contract and epithelialize, with some granulation tissue present. There continues to be some slough on all wound surfaces. 08/09/2021: She has not been able to get an appointment with dermatology because apparently the offices in [facility] and [place] do not accept [place], Legrand Rams ([id number]) [id number] Page 9 of 14 Medicaid. She is looking into whether or not she can be seen at the main [facility] dermatology clinic. This is necessary because  plastic surgery is concerned that her wounds might represent pyoderma and they did not want to do any procedure until that was clarified. We have been using Santyl under silver alginate with Kerlix and Coban wraps. Today, there was a greater amount of drainage on her dressings with a slight green discoloration and significant odor. Despite this, her wounds continue to contract and epithelialize. There is pale granulation tissue present and actually, on the left medial ankle, the granulation tissue is a bit hypertrophic. 08/16/2021: Last week, I took a culture and this grew back rare methicillin-resistant Staph aureus and rare corynebacterium. The MRSA was sensitive to gentamicin which we began applying topically on an empiric basis. This week, her wounds are a bit smaller and the drainage and odor are less. Her primary care provider is working on assisting the patient with a dermatology evaluation. She has been in silver alginate over the gentamicin that was started last week along with Kerlix and Coban wraps. 08/23/2021: Because she has Medicaid, we have been unable to get her into see any dermatologist in the [place] to rule out pyoderma gangrenosum, which was a requirement from plastic surgery prior to any sort of debridement and grafting. Despite this, however, all of her wounds continue to get smaller. The wound on her left medial ankle is nearly closed. There is no odor from the wounds, although she still accumulates a modest amount of drainage on her dressings. 08/30/2021: The lateral right ankle wound and the medial left ankle wound are a bit smaller today. The medial right ankle wound is about the same size. They are less tender. We have still been unable to get her into dermatology. 09/06/2021: All of the wounds are about the same size today. She continues to endorse minimal pain. I communicated with Dr. Claudia Desanctis in plastic surgery regarding our issues getting a dermatology appointment; he was out of  town but indicated that he would look into perhaps performing the biopsy in his office and will have his office contact her. 09/14/2021: The patient has an appointment in dermatology, but it is not until October. Her wounds are roughly the same; she continues to have very thick purulent-looking drainage on her dressings. 09/20/2021: The left medial wound is nearly closed and just has a bit of accumulated eschar on the surface. The right medial and lateral ankle wounds are perhaps a little bit smaller. They continue to have a very pale surface with accumulation of thin slough. 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 I did not think [employer] was indicated based on this. She is  getting topical mupirocin with Prisma silver collagen. 10/04/2021: The patient was not seen in clinic last week due to childcare coverage issues. In the interim, the left medial leg wound has closed. The right sided leg wounds are smaller. There is more granulation tissue coming through, particularly on the lateral wound. The surface remains somewhat gritty. We have been applying topical mupirocin and Prisma silver collagen. 10/11/2021: The left medial leg wound remains closed. She does complain of some anesthetic sensation to the area. Both of the right-sided leg wounds are smaller but still have accumulated slough. 10/18/2021: Both right-sided leg wounds are minimally smaller this week. She still continues to accumulate slough and has thick drainage on her dressings. 10/23/2021: Both wounds continue to contract. There is still slough buildup. She has been approved for a keratin-based skin substitute trial product but it will not be available until next week. 10/30/2021: The wounds are about the same to perhaps slightly smaller. There is still continued slough buildup. Unfortunately, the rep for the keratin based product did not show up today and did not answer his phone when called. 11/08/2021: The wounds are  little bit smaller today. She continues to have thick drainage but the surfaces are relatively clean with just a little bit of slough accumulation. She reported to me today that she is unable to completely flex her left ankle and on examination it seems this is potentially related to scar tissue from her wounds. We do have the ProgenaMatrix trial product available for her today. 11/15/2021: Both wounds are smaller today. There is some slough accumulation on the surfaces, but the medial wound, in particular looks like it is filling in and is less deep. She did hear from physical therapy and she is going to start working with them on July 11. She is here for her second application of the trial skin substitute, ProgenaMatrix. 11/22/2021: Both wounds continue to contract, the medial more dramatically than the lateral. Both wounds have a layer of slough on the surface, but underneath this, the gritty fibrous tissue has a little bit more of a pink cast to it rather than being as pale as it has been. 11/29/2021: The wounds are roughly the same size this week, perhaps a millimeter or 2 smaller. The medial wound has filled in and is nearly flush with the surrounding skin surface. She continues to have a lot of slough accumulation on both surfaces. 12/06/2021: No significant change to her wounds, but she has a new opening on her dorsal foot, just distal to the right lateral ankle wound. The area on her left medial ankle that reopened looks a little bit larger today. She has quite a bit of pain associated with the new wound. 12/12/2021: Her wounds look about the same but the new opening on her right lateral dorsal foot is a little bit bigger. She continues to have a fair amount of pain with this wound. 12/26/2021: The left medial ankle wound is tiny and superficial. She has 2 areas of crusting on her left lateral ankle, however, that appear to be threatening to open again. Her right medial ankle wound is a little bit  smaller today but still continues to accumulate thick rubbery slough. The new dorsal foot wound is exquisitely painful but there is no odor or purulent drainage. No erythema or induration. The right lateral ankle wound looks about the same today, again with thick rubbery slough. 01/03/2022: The left medial ankle wound has closed again. Both right ankle wounds appear to be  about the same size with thick rubbery slough. The dorsal foot wound on the right continues to be quite painful and she stated that she did not want any debridement of that site today. 01/10/2022: No real change to any of her wounds. She continues to accumulate thick slough. The dorsal foot wound has merged with the lateral malleolar wound. She is experiencing significant pain in the dorsal foot portion of the ulcer. 01/16/2022: Absolutely no change or progress in her wounds. 01/24/2022: Her wounds are unchanged. She continues to build up slough and the wounds on her dorsal right foot are still exquisitely tender. 01/31/2022: The wounds actually measure a little bit narrower today. They still have thick slough on the surface but the underlying tissue seems a little less fibrotic. We changed to Iodosorb last week. 02/07/2022: Wounds continue to slowly epithelialize around the parameters. She has less pain today. She still accumulates a fairly substantial layer of slough. 02/15/2022: No change to the wounds overall. The measured a little bit larger today per the intake nurse. She continues to have substantial slough accumulation and her pain is a little bit worse. 02/21/2022. No change at all to her wounds. I do not see that plastic surgery has received her referral yet. 02/28/2022: The wounds remain unchanged. They are dry and fibrotic with accumulation of slough and eschar. She did receive an appointment to see plastic surgery on October 23, but the office called her back and indicated they needed to reschedule and that the next available  appointment was not until December. The patient became angry and decided she did not want to see this plastics group. BRANDALYN, HARTING ([id number]) [id number] Page 10 of 14 10/19; the patient sees Dr. Lovett Calender again next week. She is using Medihoney as the primary dressing changing this herself. 03/21/2022: She saw Dr. Jaclynn Guarneri at the wound care center at [facility]. Dr. Jaclynn Guarneri was also unable to debride her, secondary to pain and is planning to take her to the operating room for operative debridement and potential skin substitute placement. Her wounds are unchanged with thick yellow slough and a fibrotic base. She says they are too painful to be debrided. In addition, yesterday her hemoglobin was 4 and she received 2 units packed red blood cell transfusion. 04/04/2022: Her operation at [facility] is scheduled for December 15. She continues to use Medihoney on her wounds. They are a little bit less painful today and she is willing to entertain the possibility of debridement. The wounds measured a little bit larger in all dimensions today but the layer of slough is not as thick as usual. 04/18/2022: Her wounds actually measured a little bit smaller today and the extension onto the dorsal foot from the lateral wound has healed. They are less tender and there is significantly less slough present as compared to prior visits. 05/09/2022: She had to reschedule her surgery due to lack of childcare. It is now planned for January 5. The wounds are basically unchanged, but she had a lot more drainage, which was thick and somewhat purulent in appearance. No significant odor. 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 They are quite painful today and she does not want to have debridement performed. 05/30/2022: The patient underwent surgical debridement and placement of TheraSkin to her wounds on January 5. She has been doing well and does not  endorse any pain. The TheraSkin is intact and looks beautiful. It is sutured in place. There is no exudate or significant drainage.  06/04/2022: Her TheraSkin remains intact and I am starting to see granulation tissue emerging underneath the surface. No concern for infection. 06/12/2022: The TheraSkin is intact and there is more granulation tissue emerging. It is starting to look a little bit dry, however. No malodor or purulent drainage. Patient History Information obtained from Patient. Family History Diabetes - Mother, Lung Disease - Mother, No family history of Cancer, Heart Disease, Hereditary Spherocytosis, Hypertension, Kidney Disease, Seizures, Stroke, Thyroid Problems, Tuberculosis. Social History Never smoker, Marital Status - Married, Alcohol Use - Never, Drug Use - No History, Caffeine Use - Daily. Medical History Eyes Denies history of Cataracts, Glaucoma, Optic Neuritis Ear/Nose/Mouth/Throat Denies history of Chronic sinus problems/congestion, Middle ear problems Hematologic/Lymphatic Patient has history of Anemia, Sickle Cell Disease Denies history of Hemophilia, Human Immunodeficiency Virus, Lymphedema Respiratory Denies history of Aspiration, Asthma, Chronic Obstructive Pulmonary Disease (COPD), Pneumothorax, Sleep Apnea, Tuberculosis Cardiovascular Denies history of Angina, Arrhythmia, Congestive Heart Failure, Coronary Artery Disease, Deep Vein Thrombosis, Hypertension, Hypotension, Myocardial Infarction, Peripheral Arterial Disease, Peripheral Venous Disease, Phlebitis, Vasculitis Gastrointestinal Denies history of Cirrhosis , Colitis, Crohnoos, Hepatitis A, Hepatitis B, Hepatitis C Endocrine Denies history of Type I Diabetes, Type II Diabetes Genitourinary Denies history of End Stage Renal Disease Immunological Denies history of Lupus Erythematosus, Raynaudoos, Scleroderma Integumentary (Skin) Denies history of History of Burn Musculoskeletal Denies history of  Gout, Rheumatoid Arthritis, Osteoarthritis, Osteomyelitis Neurologic Patient has history of Neuropathy - right foot intermittant Denies history of Dementia, Quadriplegia, Paraplegia, Seizure Disorder Oncologic Denies history of Received Chemotherapy, Received Radiation Psychiatric Denies history of Anorexia/bulimia, Confinement Anxiety Hospitalization/Surgery History - c section x2. - left breast lumpectomy. - iandD right ankle with theraskin. Medical A Surgical History Notes nd Constitutional Symptoms (General Health) H/O miscarriage Cardiovascular bradycardia Gastrointestinal [address] TARSHA, BLANDO ([id number]) [id number] Page 11 of 14 Constitutional no acute distress. Vitals Time Taken: 9:45 AM, Height: 67 in, Temperature: 98.4 F, Pulse: 76 bpm, Respiratory Rate: 16 breaths/min, Blood Pressure: 122/67 mmHg. Respiratory Normal work of breathing on room air. General Notes: 06/12/2022: The TheraSkin is intact and there is more granulation tissue emerging. It is starting to look a little bit dry, however. No malodor or purulent drainage. Integumentary (Hair, Skin) Wound #17 status is Open. Original cause of wound was Gradually Appeared. The date acquired was: 10/05/2012. The wound has been in treatment 60 weeks. The wound is located on the Right,Lateral Lower Leg. The wound measures 6.8cm length x 4.9cm width x 0.1cm depth; 26.169cm^2 area and 2.617cm^3 volume. There is Fat Layer (Subcutaneous Tissue) exposed. There is no tunneling or undermining noted. There is a medium amount of serosanguineous drainage noted. The wound margin is distinct with the outline attached to the wound base. There is small (1-33%) pink, pale granulation within the wound bed. There is a large (67-100%) amount of necrotic tissue within the wound bed including Adherent Slough. The periwound skin appearance had no abnormalities noted for moisture. The periwound skin  appearance had no abnormalities noted for color. The periwound skin appearance exhibited: Scarring. Periwound temperature was noted as No Abnormality. Wound #21 status is Open. Original cause of wound was Gradually Appeared. The date acquired was: 06/26/2021. The wound has been in treatment 50 weeks. The wound is located on the Right,Medial Ankle. The wound measures 7.7cm length x 3.5cm width x 0.1cm depth; 21.166cm^2 area and 2.117cm^3 volume. There is Fat Layer (Subcutaneous Tissue) exposed. There is no tunneling or undermining noted. There is a medium amount of serosanguineous drainage noted. The wound margin  is distinct with the outline attached to the wound base. There is small (1-33%) pink granulation within the wound bed. There is a large (67- 100%) amount of necrotic tissue within the wound bed including Adherent Slough. The periwound skin appearance had no abnormalities noted for moisture. The periwound skin appearance had no abnormalities noted for color. The periwound skin appearance exhibited: Scarring. Periwound temperature was noted as No Abnormality. 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 - Dr. Celine Ahr - room 3 Cellular or Tissue Based Products: Other Cellular or Tissue Based Products Orders/Instructions: - Theraskin applied by Psychiatric nurse, leave on until further notice Bathing/ Shower/ Hygiene: Do not shower or bathe in tub. Edema Control - Lymphedema / SCD / Other: Avoid standing for long periods of time. Exercise regularly Additional Orders / Instructions: Follow Nutritious Diet WOUND #17: - Lower Leg Wound Laterality: Right, Lateral Cleanser: Soap and Water Every Other Day/30 Days Discharge Instructions: May shower and wash wound with dial antibacterial soap and water prior to dressing  change. Cleanser: Wound Cleanser Every Other Day/30 Days Discharge Instructions: Cleanse the wound with wound cleanser prior to applying a clean dressing using gauze sponges, not tissue or cotton balls. Peri-Wound Care: Triamcinolone 15 (g) Every Other Day/30 Days Discharge Instructions: Use triamcinolone 15 (g) as directed Peri-Wound Care: Sween Lotion (Moisturizing lotion) Every Other Day/30 Days Discharge Instructions: Apply moisturizing lotion as directed Prim Dressing: ADAPTIC TOUCH 3x4.25 (in/in) Every Other Day/30 Days ary Discharge Instructions: Apply to wound bed as instructed Secondary Dressing: ABD Pad, 5x9 Every Other Day/30 Days Discharge Instructions: Apply over primary dressing as directed. Secured With: Elastic Bandage 4 inch (ACE bandage) Every Other Day/30 Days Discharge Instructions: Secure with ACE bandage as directed. Com pression Wrap: Kerlix Roll 4.5x3.1 (in/yd) Every Other Day/30 Days Discharge Instructions: Apply Kerlix and Coban compression as directed. WOUND #21: - Ankle Wound Laterality: Right, Medial Cleanser: Soap and Water Every Other Day/30 Days Discharge Instructions: May shower and wash wound with dial antibacterial soap and water prior to dressing change. Cleanser: Wound Cleanser Every Other Day/30 Days Discharge Instructions: Cleanse the wound with wound cleanser prior to applying a clean dressing using gauze sponges, not tissue or cotton balls. Peri-Wound Care: Triamcinolone 15 (g) Every Other Day/30 Days Discharge Instructions: Use triamcinolone 15 (g) as directed Peri-Wound Care: Sween Lotion (Moisturizing lotion) Every Other Day/30 Days Discharge Instructions: Apply moisturizing lotion as directed Prim Dressing: ADAPTIC TOUCH 3x4.25 (in/in) Every Other Day/30 Days ary Discharge Instructions: Apply to wound bed as instructed Secondary Dressing: ABD Pad, 5x9 Every Other Day/30 Days Discharge Instructions: Apply over primary dressing as  directed. ALLEE, BUSK ([id number]) [id number] Page 12 of 14 Secured With: Elastic Bandage 4 inch (ACE bandage) Every Other Day/30 Days Discharge Instructions: Secure with ACE bandage as directed. Compression Wrap: Kerlix Roll 4.5x3.1 (in/yd) Every Other Day/30 Days Discharge Instructions: Apply Kerlix and Coban compression as directed. 06/12/2022: The TheraSkin is intact and there is more granulation tissue emerging. It is starting to look a little bit dry, however. No malodor or purulent drainage. As the TheraSkin continues to look excellent, I am leaving it in place. I think she would benefit from adding a thin layer of hydrogel underneath the Adaptic she is applying. We will initiate this and she will follow-up in 1 week. Electronic Signature(s) Signed: 06/12/2022 10:07:29 AM By: Fredirick Maudlin MD FACS Entered By: Celine Ahr,  Cayman Brogden on 06/12/2022 10:07:29 -------------------------------------------------------------------------------- HxROS Details Patient Name: Date of Service: [name] [name] 06/12/2022 9:30 A M Medical Record Number: [id number] Patient Account Number: [id number] Date of Birth/Sex: Treating RN: 03-Dec-1970 (52 y.o. F) Primary Care Provider: [name] Other Clinician: Referring Provider: Treating Provider/Extender: Elyse Jarvis Weeks in Treatment: 21 Information Obtained From Patient Constitutional Symptoms (General Health) Medical History: Past Medical History Notes: H/O miscarriage Eyes Medical History: Negative for: Cataracts; Glaucoma; Optic Neuritis Ear/Nose/Mouth/Throat Medical History: Negative for: Chronic sinus problems/congestion; Middle ear problems Hematologic/Lymphatic Medical History: Positive for: Anemia; Sickle Cell Disease Negative for: Hemophilia; Human Immunodeficiency Virus; Lymphedema Respiratory Medical History: Negative for: Aspiration; Asthma; Chronic Obstructive Pulmonary  Disease (COPD); Pneumothorax; Sleep Apnea; Tuberculosis Cardiovascular Medical History: Negative for: Angina; Arrhythmia; Congestive Heart Failure; Coronary Artery Disease; Deep Vein Thrombosis; Hypertension; Hypotension; Myocardial Infarction; Peripheral Arterial Disease; Peripheral Venous Disease; Phlebitis; Vasculitis Past Medical History Notes: bradycardia Gastrointestinal Medical History: Negative for: Cirrhosis ; Colitis; Crohns; Hepatitis A; Hepatitis B; Hepatitis C Past Medical History NotesZEKIAH, COEN ([id number]) [id number] Page 13 of 14 cholilithiasis Endocrine Medical History: Negative for: Type I Diabetes; Type II Diabetes Genitourinary Medical History: Negative for: End Stage Renal Disease Immunological Medical History: Negative for: Lupus Erythematosus; Raynauds; Scleroderma Integumentary (Skin) Medical History: Negative for: History of Burn Musculoskeletal Medical History: Negative for: Gout; Rheumatoid Arthritis; Osteoarthritis; Osteomyelitis Neurologic Medical History: Positive for: Neuropathy - right foot intermittant Negative for: Dementia; Quadriplegia; Paraplegia; Seizure Disorder Oncologic Medical History: Negative for: Received Chemotherapy; Received Radiation Psychiatric Medical History: Negative for: Anorexia/bulimia; Confinement Anxiety Immunizations Pneumococcal Vaccine: Received Pneumococcal Vaccination: No Implantable Devices None Hospitalization / Surgery History Type of Hospitalization/Surgery c section x2 left breast lumpectomy iandD right ankle with theraskin Family and Social History Cancer: No; Diabetes: Yes - Mother; Heart Disease: No; Hereditary Spherocytosis: No; Hypertension: No; Kidney Disease: No; Lung Disease: Yes - Mother; Seizures: No; Stroke: No; Thyroid Problems: No; Tuberculosis: No; Never smoker; Marital Status - Married; Alcohol Use: Never; Drug Use: No History; Caffeine Use: Daily;  Financial Concerns: No; Food, Clothing or Shelter Needs: No; Support System Lacking: No; Transportation Concerns: No Electronic Signature(s) Signed: 06/12/2022 10:27:15 AM By: Fredirick Maudlin MD FACS Entered By: Fredirick Maudlin on 06/12/2022 10:05:22 SuperBill Details -------------------------------------------------------------------------------- [name] ([id number]) [id number] Page 14 of 14 Patient Name: Date of Service: [name] [name] 06/12/2022 Medical Record Number: [id number] Patient Account Number: [id number] Date of Birth/Sex: Treating RN: 1970-10-16 (52 y.o. 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 Facility Procedures CPT4 Code Description Modifier Quantity [id number] [phone] - WOUND CARE VISIT-LEV 3 EST PT 1 Physician Procedures Quantity CPT4 Code Description Modifier [id number] - WC PHYS LEVEL 3 - EST PT 1 ICD-10 Diagnosis Description L97.818 Non-pressure chronic ulcer of other part of right lower leg with other specified severity 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

## 2022-06-12 NOTE — Progress Notes (Signed)
[name], [name] ([id number]) [id number] Page 1 of 10 Visit Report for 06/12/2022 Arrival Information Details Patient Name: Date of Service: [name] [name] 06/12/2022 9:30 A M Medical Record Number: [id number] Patient Account Number: [id number] Date of Birth/Sex: Treating RN: [birth year] (52 y.o. [name] Primary Care Faizon Capozzi: [name] Other Clinician: Referring Zevin Nevares: Treating Batoul Limes/Extender: Elyse Jarvis Weeks in Treatment: 34 Visit Information History Since Last Visit Added or deleted any medications: No Patient Arrived: [name] Any new allergies or adverse reactions: No Arrival Time: 09:45 Had a fall or experienced change in No Accompanied By: self activities of daily living that may affect Transfer Assistance: None risk of falls: Patient Requires Transmission-Based Precautions: No Signs or symptoms of abuse/neglect since last visito No Patient Has Alerts: No Hospitalized since last visit: No Implantable device outside of the clinic excluding No cellular tissue based products placed in the center since last visit: Has Dressing in Place as Prescribed: Yes Has Compression in Place as Prescribed: Yes Pain Present Now: Yes Electronic Signature(s) Signed: 06/12/2022 5:02:56 PM By: Sharyn Creamer RN, BSN Entered By: Sharyn Creamer on 06/12/2022 09:46:09 -------------------------------------------------------------------------------- Clinic Level of Care Assessment Details Patient Name: Date of Service: [name], [place] [name] 06/12/2022 9:30 A M Medical Record Number: [id number] Patient Account Number: [id number] Date of Birth/Sex: Treating RN: [birth year] (52 y.o. [name] Primary Care Jerrald Doverspike: [name] Other Clinician: Referring Remingtyn Depaola: Treating Aspynn Clover/Extender: Elyse Jarvis Weeks in Treatment: 60 Clinic Level of Care Assessment Items TOOL 4 Quantity Score X- 1 0 Use when  only an EandM is performed on FOLLOW-UP visit ASSESSMENTS - Nursing Assessment / Reassessment X- 1 10 Reassessment of Co-morbidities (includes updates in patient status) X- 1 5 Reassessment of Adherence to Treatment Plan ASSESSMENTS - Wound and Skin A ssessment / Reassessment X - Simple Wound Assessment / Reassessment - one wound 1 5 '[]'$  - 0 Complex Wound Assessment / Reassessment - multiple wounds '[]'$  - 0 Dermatologic / Skin Assessment (not related to wound area) ASSESSMENTS - Focused Assessment '[]'$  - 0 Circumferential Edema Measurements - multi extremities '[]'$  - 0 Nutritional Assessment / Counseling / Intervention [name] ([id number]) [id number] Page 2 of 10 '[]'$  - 0 Lower Extremity Assessment (monofilament, tuning fork, pulses) '[]'$  - 0 Peripheral Arterial Disease Assessment (using hand held doppler) ASSESSMENTS - Ostomy and/or Continence Assessment and Care '[]'$  - 0 Incontinence Assessment and Management '[]'$  - 0 Ostomy Care Assessment and Management (repouching, etc.) PROCESS - Coordination of Care X - Simple Patient / Family Education for ongoing care 1 15 '[]'$  - 0 Complex (extensive) Patient / Family Education for ongoing care X- 1 10 Staff obtains Programmer, systems, Records, T Results / Process Orders est '[]'$  - 0 Staff telephones HHA, Nursing Homes / Clarify orders / etc '[]'$  - 0 Routine Transfer to another Facility (non-emergent condition) '[]'$  - 0 Routine Hospital Admission (non-emergent condition) '[]'$  - 0 New Admissions / Biomedical engineer / Ordering NPWT Apligraf, etc. , '[]'$  - 0 Emergency Hospital Admission (emergent condition) '[]'$  - 0 Simple Discharge Coordination '[]'$  - 0 Complex (extensive) Discharge Coordination PROCESS - Special Needs '[]'$  - 0 Pediatric / Minor Patient Management '[]'$  - 0 Isolation Patient Management '[]'$  - 0 Hearing / Language / Visual special needs '[]'$  - 0 Assessment of Community assistance (transportation, D/C planning,  etc.) '[]'$  - 0 Additional assistance / Altered mentation '[]'$  - 0 Support Surface(s) Assessment (bed, cushion, seat, etc.) INTERVENTIONS - Wound Cleansing / Measurement X - Simple Wound Cleansing -  one wound 1 5 '[]'$  - 0 Complex Wound Cleansing - multiple wounds X- 1 5 Wound Imaging (photographs - any number of wounds) '[]'$  - 0 Wound Tracing (instead of photographs) X- 1 5 Simple Wound Measurement - one wound '[]'$  - 0 Complex Wound Measurement - multiple wounds INTERVENTIONS - Wound Dressings '[]'$  - 0 Small Wound Dressing one or multiple wounds X- 1 15 Medium Wound Dressing one or multiple wounds '[]'$  - 0 Large Wound Dressing one or multiple wounds '[]'$  - 0 Application of Medications - topical '[]'$  - 0 Application of Medications - injection INTERVENTIONS - Miscellaneous '[]'$  - 0 External ear exam '[]'$  - 0 Specimen Collection (cultures, biopsies, blood, body fluids, etc.) '[]'$  - 0 Specimen(s) / Culture(s) sent or taken to Lab for analysis '[]'$  - 0 Patient Transfer (multiple staff / Civil Service fast streamer / Similar devices) '[]'$  - 0 Simple Staple / Suture removal (25 or less) '[]'$  - 0 Complex Staple / Suture removal (26 or more) '[]'$  - 0 Hypo / Hyperglycemic Management (close monitor of Blood Glucose) Swails, Wrenna ([id number]) [id number] Page 3 of 10 '[]'$  - 0 Ankle / Brachial Index ([name]) - do not check if billed separately X- 1 5 Vital Signs Has the patient been seen at the hospital within the last three years: Yes Total Score: 80 Level Of Care: New/Established - Level 3 Electronic Signature(s) Signed: 06/12/2022 5:02:56 PM By: Sharyn Creamer RN, BSN Entered By: Sharyn Creamer on 06/12/2022 10:19:46 -------------------------------------------------------------------------------- Encounter Discharge Information Details Patient Name: Date of Service: [name], [name] 06/12/2022 9:30 A M Medical Record Number: [id number] Patient Account Number: [id number] Date of Birth/Sex:  Treating RN: [birth year] (52 y.o. [name] Primary Care Asiah Befort: [name] Other Clinician: Referring Desmund Elman: Treating Rolla Kedzierski/Extender: Elyse Jarvis Weeks in Treatment: 67 Encounter Discharge Information Items Discharge Condition: Stable Ambulatory Status: Cane Discharge Destination: Home Transportation: Private Auto Accompanied By: self Schedule Follow-up Appointment: Yes Clinical Summary of Care: Patient Declined Electronic Signature(s) Signed: 06/12/2022 5:02:56 PM By: Sharyn Creamer RN, BSN Entered By: Sharyn Creamer on 06/12/2022 10:09:41 -------------------------------------------------------------------------------- Lower Extremity Assessment Details Patient Name: Date of Service: [name] [name], [name] 06/12/2022 9:30 A M Medical Record Number: [id number] Patient Account Number: [id number] Date of Birth/Sex: Treating RN: August 25, 1970 (52 y.o. [name] Primary Care Oluwatamilore Starnes: [name] Other Clinician: Referring Jannessa Ogden: Treating Marco Raper/Extender: Elyse Jarvis Weeks in Treatment: 60 Edema Assessment Assessed: [Left: No] [Right: No] Edema: [Left: Ye] [Right: s] Calf Left: Right: Point of Measurement: 33 cm From Medial Instep 30 cm Ankle Left: Right: Point of Measurement: 10 cm From Medial Instep 19 cm Vascular Assessment Left: [[id number] Page 4 of 10Right:] Pulses: Dorsalis Pedis Palpable: [phone].pdf Page 4 of 10Yes] Electronic Signature(s) Signed: 06/12/2022 5:02:56 PM By: Sharyn Creamer RN, BSN Entered By: Sharyn Creamer on 06/12/2022 09:49:09 -------------------------------------------------------------------------------- Multi Wound Chart Details Patient Name: Date of Service: [name] [name], [name] 06/12/2022 9:30 A M Medical Record Number: [id number] Patient Account Number: [id number] Date of Birth/Sex: Treating RN: Mar 08, 1971 (52 y.o.  F) Primary Care Eliyohu Class: [name] Other Clinician: Referring Jamell Laymon: Treating Joon Pohle/Extender: Elyse Jarvis Weeks in Treatment: 60 Vital Signs Height(in): 73 Pulse(bpm): 15 Weight(lbs): Blood Pressure(mmHg): 122/67 Body Mass Index(BMI): Temperature(F): 98.4 Respiratory Rate(breaths/min): 16 [17:Photos:] [N/A:N/A] Right, Lateral Lower Leg Right, Medial Ankle N/A Wound Location: Gradually Appeared Gradually Appeared N/A Wounding Event: Sickle Cell Lesion Sickle Cell Lesion N/A Primary Etiology: N/A Venous Leg Ulcer N/A Secondary Etiology: Anemia, Sickle Cell Disease, Anemia, Sickle  Cell Disease, N/A Comorbid History: Neuropathy Neuropathy 10/05/2012 06/26/2021 N/A Date Acquired: 27 50 N/A Weeks of Treatment: Open Open N/A Wound Status: No No N/A Wound Recurrence: Yes No N/A Clustered Wound: 6.8x4.9x0.1 7.7x3.5x0.1 N/A Measurements L x W x D (cm) 26.169 21.166 N/A A (cm) : rea 2.617 2.117 N/A Volume (cm) : 86.10% 27.00% N/A % Reduction in Area: 86.10% 26.90% N/A % Reduction in Volume: Full Thickness Without Exposed Full Thickness Without Exposed N/A Classification: Support Structures Support Structures Medium Medium N/A Exudate Amount: Serosanguineous Serosanguineous N/A Exudate Type: red, brown red, brown N/A Exudate Color: Distinct, outline attached Distinct, outline attached N/A Wound Margin: Small (1-33%) Small (1-33%) N/A Granulation Amount: Pink, Pale Pink N/A Granulation Quality: Large (67-100%) Large (67-100%) N/A Necrotic Amount: Fat Layer (Subcutaneous Tissue): Yes Fat Layer (Subcutaneous Tissue): Yes N/A Exposed Structures: Fascia: No Fascia: No Tendon: No Tendon: No Muscle: No Muscle: No Joint: No Joint: No Bone: No Bone: No Small (1-33%) Small (1-33%) N/A Epithelialization: Scarring: Yes Scarring: Yes N/A Periwound Skin Texture: No Abnormalities Noted No Abnormalities Noted N/A Periwound Skin  MoistureDANITA, PROUD ([id number]) [id number] Page 5 of 10 No Abnormalities Noted No Abnormalities Noted N/A Periwound Skin Color: No Abnormality No Abnormality N/A Temperature: Treatment Notes Electronic Signature(s) Signed: 06/12/2022 10:04:39 AM By: Fredirick Maudlin MD FACS Entered By: Fredirick Maudlin on 06/12/2022 10:04:38 -------------------------------------------------------------------------------- Multi-Disciplinary Care Plan Details Patient Name: Date of Service: [name], [name] 06/12/2022 9:30 A M Medical Record Number: [id number] Patient Account Number: [id number] Date of Birth/Sex: Treating RN: [birth year] (52 y.o. [name] Primary Care Wendi Lastra: [name] Other Clinician: Referring Concettina Leth: Treating Jahliyah Trice/Extender: Elyse Jarvis Weeks in Treatment: 65 Multidisciplinary Care Plan reviewed with physician Active Inactive Venous Leg Ulcer Nursing Diagnoses: Actual venous Insuffiency (use after diagnosis is confirmed) Knowledge deficit related to disease process and management Goals: Patient will maintain optimal edema control Date Initiated: 06/26/2021 Target Resolution Date: 06/21/2022 Goal Status: Active Interventions: Assess peripheral edema status every visit. Compression as ordered Treatment Activities: Therapeutic compression applied : 06/26/2021 Notes: Wound/Skin Impairment Nursing Diagnoses: Impaired tissue integrity Goals: Patient/caregiver will verbalize understanding of skin care regimen Date Initiated: 04/17/2021 Target Resolution Date: 06/21/2022 Goal Status: Active Ulcer/skin breakdown will have a volume reduction of 30% by week 4 Date Initiated: 04/17/2021 Date Inactivated: 05/29/2021 Target Resolution Date: 05/15/2021 Goal Status: Met Ulcer/skin breakdown will have a volume reduction of 50% by week 8 Date Initiated: 05/29/2021 Date Inactivated: 06/26/2021 Target Resolution Date:  06/26/2021 Goal Status: Unmet Unmet Reason: venous reflux Interventions: Assess patient/caregiver ability to obtain necessary supplies Assess patient/caregiver ability to perform ulcer/skin care regimen upon admission and as needed Assess ulceration(s) every visit Provide education on ulcer and skin care Treatment Activities: Topical wound management initiated : 04/17/2021 [name] ([id number]) [id number].pdf Page 6 of 10 Notes: 06/08/21: Left leg wounds greater than 30% volume reduction, right leg acute infection. Electronic Signature(s) Signed: 06/12/2022 5:02:56 PM By: Sharyn Creamer RN, BSN Entered By: Sharyn Creamer on 06/12/2022 09:58:30 -------------------------------------------------------------------------------- Pain Assessment Details Patient Name: Date of Service: [name] [name], [name] 06/12/2022 9:30 A M Medical Record Number: [id number] Patient Account Number: [id number] Date of Birth/Sex: Treating RN: 03-23-1971 (52 y.o. [name] Primary Care Rebacca Votaw: [name] Other Clinician: Referring Keierra Nudo: Treating Gene Colee/Extender: Elyse Jarvis Weeks in Treatment: 53 Active Problems Location of Pain Severity and Description of Pain Patient Has Paino Yes Site Locations Rate the pain. Current Pain Level: 2 Pain Management and Medication Current Pain Management:  Electronic Signature(s) Signed: 06/12/2022 5:02:56 PM By: Sharyn Creamer RN, BSN Entered By: Sharyn Creamer on 06/12/2022 09:46:50 -------------------------------------------------------------------------------- Patient/Caregiver Education Details Patient Name: Date of Service: [name], [name] 1/24/2024andnbsp9:30 [place] Record Number: [id number] Patient Account Number: [id number] Date of Birth/Gender: Treating RN: 20-Mar-1971 (52 y.o. [name] Primary Care Physician: [name] Other Clinician: Referring Physician: Treating  Physician/Extender: Elyse Jarvis Weeks in Treatment: [address] [place], Legrand Rams ([id number]) [id number] Page 7 of 10 Education Provided To: Patient Education Topics Provided Wound/Skin Impairment: Methods: Explain/Verbal Responses: State content correctly Electronic Signature(s) Signed: 06/12/2022 5:02:56 PM By: Sharyn Creamer RN, BSN Entered By: Sharyn Creamer on 06/12/2022 09:59:12 -------------------------------------------------------------------------------- Wound Assessment Details Patient Name: Date of Service: [name] [name], [name] 06/12/2022 9:30 A M Medical Record Number: [id number] Patient Account Number: [id number] Date of Birth/Sex: Treating RN: Jun 05, 1970 (52 y.o. [name] Primary Care Beryle Zeitz: [name] Other Clinician: Referring Vickee Mormino: Treating Venus Gilles/Extender: Elyse Jarvis Weeks in Treatment: 60 Wound Status Wound Number: 17 Primary Etiology: Sickle Cell Lesion Wound Location: Right, Lateral Lower Leg Wound Status: Open Wounding Event: Gradually Appeared Comorbid History: Anemia, Sickle Cell Disease, Neuropathy Date Acquired: 10/05/2012 Weeks Of Treatment: 60 Clustered Wound: Yes Photos Wound Measurements Length: (cm) 6.8 Width: (cm) 4.9 Depth: (cm) 0.1 Area: (cm) 26.169 Volume: (cm) 2.617 % Reduction in Area: 86.1% % Reduction in Volume: 86.1% Epithelialization: Small (1-33%) Tunneling: No Undermining: No Wound Description Classification: Full Thickness Without Exposed Support Structures Wound Margin: Distinct, outline attached Exudate Amount: Medium Exudate Type: Serosanguineous Exudate Color: red, brown Foul Odor After Cleansing: No Slough/Fibrino Yes Wound Bed Granulation Amount: Small (1-33%) Exposed Structure Granulation Quality: Pink, Pale Fascia Exposed: No Necrotic Amount: Large (67-100%) Fat Layer (Subcutaneous Tissue) Exposed:  Yes Necrotic Quality: Adherent Slough Tendon Exposed: No [name] ([id number]) [phone] Page 8 of 10 Muscle Exposed: No Joint Exposed: No Bone Exposed: No Periwound Skin Texture Texture Color No Abnormalities Noted: No No Abnormalities Noted: Yes Scarring: Yes Temperature / Pain Temperature: No Abnormality Moisture No Abnormalities Noted: Yes Treatment Notes Wound #17 (Lower Leg) Wound Laterality: Right, Lateral Cleanser Soap and Water Discharge Instruction: May shower and wash wound with dial antibacterial soap and water prior to dressing change. Wound Cleanser Discharge Instruction: Cleanse the wound with wound cleanser prior to applying a clean dressing using gauze sponges, not tissue or cotton balls. Peri-Wound Care Triamcinolone 15 (g) Discharge Instruction: Use triamcinolone 15 (g) as directed Sween Lotion (Moisturizing lotion) Discharge Instruction: Apply moisturizing lotion as directed Topical Skintegrity Hydrogel 4 (oz) Discharge Instruction: Apply hydrogel as directed Primary Dressing ADAPTIC TOUCH 3x4.25 (in/in) Discharge Instruction: Apply to wound bed as instructed Secondary Dressing ABD Pad, 5x9 Discharge Instruction: Apply over primary dressing as directed. Secured With Elastic Bandage 4 inch (ACE bandage) Discharge Instruction: Secure with ACE bandage as directed. Compression Wrap Kerlix Roll 4.5x3.1 (in/yd) Discharge Instruction: Apply Kerlix and Coban compression as directed. Compression Stockings Add-Ons Electronic Signature(s) Signed: 06/12/2022 5:02:56 PM By: Sharyn Creamer RN, BSN Entered By: Sharyn Creamer on 06/12/2022 09:53:18 -------------------------------------------------------------------------------- Wound Assessment Details Patient Name: Date of Service: [name] [name], [name] 06/12/2022 9:30 A M Medical Record Number: [id number] Patient Account Number: [id number] Date of Birth/Sex: Treating RN: 07-14-1970  (52 y.o. [name] Primary Care Silvino Selman: [name] Other Clinician: Referring Fawaz Borquez: Treating Juliza Machnik/Extender: Elyse Jarvis Weeks in Treatment: [address] Wound Status [place], Legrand Rams ([id number]) [id number] Page 9 of 10 Wound Number: 21 Primary Etiology: Sickle Cell Lesion Wound  Location: Right, Medial Ankle Secondary Etiology: Venous Leg Ulcer Wounding Event: Gradually Appeared Wound Status: Open Date Acquired: 06/26/2021 Comorbid History: Anemia, Sickle Cell Disease, Neuropathy Weeks Of Treatment: 50 Clustered Wound: No Photos Wound Measurements Length: (cm) 7.7 Width: (cm) 3.5 Depth: (cm) 0.1 Area: (cm) 21.166 Volume: (cm) 2.117 % Reduction in Area: 27% % Reduction in Volume: 26.9% Epithelialization: Small (1-33%) Tunneling: No Undermining: No Wound Description Classification: Full Thickness Without Exposed Support Structures Wound Margin: Distinct, outline attached Exudate Amount: Medium Exudate Type: Serosanguineous Exudate Color: red, brown Foul Odor After Cleansing: No Slough/Fibrino Yes Wound Bed Granulation Amount: Small (1-33%) Exposed Structure Granulation Quality: Pink Fascia Exposed: No Necrotic Amount: Large (67-100%) Fat Layer (Subcutaneous Tissue) Exposed: Yes Necrotic Quality: Adherent Slough Tendon Exposed: No Muscle Exposed: No Joint Exposed: No Bone Exposed: No Periwound Skin Texture Texture Color No Abnormalities Noted: No No Abnormalities Noted: Yes Scarring: Yes Temperature / Pain Temperature: No Abnormality Moisture No Abnormalities Noted: Yes Treatment Notes Wound #21 (Ankle) Wound Laterality: Right, Medial Cleanser Soap and Water Discharge Instruction: May shower and wash wound with dial antibacterial soap and water prior to dressing change. Wound Cleanser Discharge Instruction: Cleanse the wound with wound cleanser prior to applying a clean dressing using gauze sponges,  not tissue or cotton balls. Peri-Wound Care Triamcinolone 15 (g) Discharge Instruction: Use triamcinolone 15 (g) as directed Sween Lotion (Moisturizing lotion) Discharge Instruction: Apply moisturizing lotion as directed Topical Primary Dressing ADAPTIC TOUCH 3x4.25 (in/in) Discharge Instruction: Apply to wound bed as instructed [name] ([id number]) [id number] Page 10 of 10 Secondary Dressing ABD Pad, 5x9 Discharge Instruction: Apply over primary dressing as directed. Secured With Elastic Bandage 4 inch (ACE bandage) Discharge Instruction: Secure with ACE bandage as directed. Compression Wrap Kerlix Roll 4.5x3.1 (in/yd) Discharge Instruction: Apply Kerlix and Coban compression as directed. Compression Stockings Add-Ons Electronic Signature(s) Signed: 06/12/2022 5:02:56 PM By: Sharyn Creamer RN, BSN Entered By: Sharyn Creamer on 06/12/2022 09:54:27 -------------------------------------------------------------------------------- Superior Details Patient Name: Date of Service: [name] [name], [name] 06/12/2022 9:30 A M Medical Record Number: [id number] Patient Account Number: [id number] Date of Birth/Sex: Treating RN: 1971-03-20 (52 y.o. [name] Primary Care Jamisyn Langer: [name] Other Clinician: Referring Connor Meacham: Treating Sesar Madewell/Extender: Elyse Jarvis Weeks in Treatment: 60 Vital Signs Time Taken: 09:45 Temperature (F): 98.4 Height (in): 67 Pulse (bpm): 76 Respiratory Rate (breaths/min): 16 Blood Pressure (mmHg): 122/67 Reference Range: 80 - 120 mg / dl Electronic Signature(s) Signed: 06/12/2022 5:02:56 PM By: Sharyn Creamer RN, BSN Entered By: Sharyn Creamer on 06/12/2022 09:46:43

## 2022-06-18 ENCOUNTER — Ambulatory Visit: Payer: Medicaid Other | Admitting: Family Medicine

## 2022-06-18 VITALS — BP 116/57 | HR 74 | Temp 97.8°F | Ht 67.0 in | Wt 134.2 lb

## 2022-06-18 DIAGNOSIS — E559 Vitamin D deficiency, unspecified: Secondary | ICD-10-CM

## 2022-06-18 DIAGNOSIS — G894 Chronic pain syndrome: Secondary | ICD-10-CM | POA: Diagnosis not present

## 2022-06-18 DIAGNOSIS — D638 Anemia in other chronic diseases classified elsewhere: Secondary | ICD-10-CM

## 2022-06-18 DIAGNOSIS — D571 Sickle-cell disease without crisis: Secondary | ICD-10-CM

## 2022-06-18 DIAGNOSIS — K5903 Drug induced constipation: Secondary | ICD-10-CM

## 2022-06-18 MED ORDER — GABAPENTIN 300 MG PO CAPS: ORAL_CAPSULE | ORAL | 2 refills | Status: DC

## 2022-06-18 MED ORDER — POLYETHYLENE GLYCOL 3350 17 GM/SCOOP PO POWD: 17.0000 g | Freq: Two times a day (BID) | ORAL | 1 refills | Status: DC | PRN

## 2022-06-18 MED ORDER — OXYCODONE HCL 10 MG PO TABS: 10.0000 mg | ORAL_TABLET | ORAL | 0 refills | Status: DC

## 2022-06-18 MED ORDER — ASPIRIN 81 MG PO TBEC: 81.0000 mg | DELAYED_RELEASE_TABLET | Freq: Every day | ORAL | 12 refills | Status: DC

## 2022-06-18 NOTE — Progress Notes (Signed)
Established Patient Office Visit  Subjective   Patient ID: [name], female    DOB: December 01, 1970  Age: 52 y.o. MRN: [id number]  Chief Complaint  Patient presents with   sickle cell follow up    [name] is a 52 year old female with a medical history significant for sickle cell disease, chronic pain syndrome, opiate dependence and tolerance, bilateral lower extremity ulcers and anemia of chronic disease presents for follow-up of her chronic conditions.  Patient patient states that she has been having increased bilateral lower extremity pain.  Patient patient primarily has pain to her back and lower extremities.  She currently rates pain as 5/10.  She last had oxycodone this a.m. with some relief.  Patient denies any shortness of breath, headache, chest pain, urinary symptoms, nausea, vomiting, or diarrhea.  She follows with wound care for bilateral lower extremity ulcers.    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   Sickle-cell disease with pain ([place]) 09/03/2018   Other chronic pain    Sickle cell crisis ([place]) 06/26/2018   Sickle cell pain crisis ([place]) 04/07/2018   Avascular necrosis of bone of hip, left ([place]) 12/08/2017   Avascular necrosis of bone of hip, right ([place]) 12/08/2017   Hb-SS disease without crisis ([place]) 11/26/2017   Anemia of chronic disease 11/26/2017   Abnormal quad screen    Ringworm of body 04/05/2017   Ulcer of right lower extremity ([place])    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[place]) 03/02/2017   History of  pre-eclampsia 12/16/2016   Previous cesarean delivery x 2 07/22/2013   Hemochromatosis 11/10/2012   Chronic pain syndrome [id number]   Complicated wound infection 04/01/2012   Leukocytosis 03/30/2012   Past Medical History:  Diagnosis Date   Anemia 03/30/2012   Hx of sickle cell disease   CAP (community acquired pneumonia)    2014   Cholelithiasis 03/30/2012   Chronic wound of extremity 04/01/2012   Elevated LFTs    Leg ulcer ([place]) 10/27/2012   Chronic under care of wound clinic   Multiple open wounds of lower extremity    chronic wounds B/LLE   Sickle cell disease ([place])    Sinus bradycardia by electrocardiogram 04/03/2012   Past Surgical History:  Procedure Laterality Date   ALLOGRAFT APPLICATION Right [id number]   Procedure: SURGICAL PREP FOR GRAFTING RIGHT LOWER EXTREMITY AND APPLICATION OF Jannifer Hick;  Surgeon: Irene Limbo, MD;  Location: [facility];  Service: Plastics;  Laterality: Right;   APPLICATION OF A-CELL OF EXTREMITY Right 10/09/2015   Procedure: APPLICATION OF Jannifer Hick;  Surgeon: Irene Limbo, MD;  Location: [facility];  Service: Plastics;  Laterality: Right;   BREAST SURGERY     left breast cyst aspiration   CESAREAN SECTION     CESAREAN SECTION N/A 01/06/2014   Procedure: CESAREAN SECTION;  Surgeon: Emily Filbert, MD;  Location: [facility];  Service: Obstetrics;  Laterality: N/A;   CESAREAN SECTION N/A 05/04/2017   Procedure: CESAREAN SECTION;  Surgeon: Woodroe Mode, MD;  Location: [facility];  Service:  Obstetrics;  Laterality: N/A;   I & D EXTREMITY Right 10/09/2015   Procedure: SURGICAL PREPARATION FOR GRAFTING RIGHT ANKLE AND APPLICATION THERASKIN;  Surgeon: Irene Limbo, MD;  Location: [facility];  Service: Plastics;  Laterality: Right;   SKIN FULL THICKNESS GRAFT Bilateral 06/17/2016   Procedure: SURGICAL PREP FOR GRAFTING, BILATERAL LOWER EXTREMITIES AND APPLICATION OF Jannifer Hick;  Surgeon: Irene Limbo, MD;  Location: [facility];  Service: Plastics;  Laterality: Bilateral;   TONSILLECTOMY     Social History   Tobacco Use   Smoking status: Never    Passive exposure: Never   Smokeless tobacco: Never  Vaping Use   Vaping Use: Never used  Substance Use Topics   Alcohol use: No   Drug use: No   Social History   Socioeconomic History   Marital status: Married    Spouse name: [name]   Number of children: 1   Years of education: Not on file   Highest education level: Not on file  Occupational History   Occupation: Employed in home care.  Tobacco Use   Smoking status: Never    Passive exposure: Never   Smokeless tobacco: Never  Vaping Use   Vaping Use: Never used  Substance and Sexual Activity   Alcohol use: No   Drug use: No   Sexual activity: Yes    Birth control/protection: None  Other Topics Concern   Not on file  Social History Narrative   Lives with husband.   Social Determinants of Health   Financial Resource Strain: Not on file  Food Insecurity: Not on file  Transportation Needs: Not on file  Physical Activity: Not on file  Stress: Not on file  Social Connections: Not on file  Intimate Partner Violence: Not on file   Family Status  Relation Name Status   Sister  (Not Specified)   Mother  Alive   Father  Deceased   Family History  Problem Relation Age of Onset   Sickle cell anemia Sister    Diabetes Mother    Asthma Mother    Sickle cell trait Mother    Sickle cell trait Father    Hypertension Father    No Known Allergies    Review of Systems  Constitutional: Negative.   HENT: Negative.    Respiratory: Negative.    Cardiovascular: Negative.   Genitourinary: Negative.   Musculoskeletal:  Positive for back pain and joint pain.  Skin: Negative.   Neurological: Negative.   Psychiatric/Behavioral: Negative.        Objective:     BP (!) 116/57   Pulse 74   Temp 97.8 F (36.6 C)   Ht 5' 7"$  (1.702 m)   Wt 134 lb 3.2  oz (60.9 kg)   LMP 05/18/2022   SpO2 96%   BMI 21.02 kg/m  BP Readings from Last 3 Encounters:  06/18/22 (!) 116/57  03/21/22 121/62  03/20/22 116/68   Wt Readings from Last 3 Encounters:  06/18/22 134 lb 3.2 oz (60.9 kg)  03/19/22 129 lb (58.5 kg)  12/18/21 132 lb (59.9 kg)      Physical Exam Constitutional:      Appearance: Normal appearance.  HENT:     Mouth/Throat:     Mouth: Mucous membranes are moist.     Pharynx: Oropharynx is clear.  Eyes:     Pupils: Pupils are equal, round, and reactive to light.  Cardiovascular:     Rate and Rhythm: Normal rate and regular rhythm.  Pulses: Normal pulses.  Pulmonary:     Effort: Pulmonary effort is normal.  Abdominal:     General: Bowel sounds are normal.  Musculoskeletal:        General: Normal range of motion.  Skin:    General: Skin is warm.  Neurological:     General: No focal deficit present.     Mental Status: She is alert. Mental status is at baseline.  Psychiatric:        Mood and Affect: Mood normal.        Thought Content: Thought content normal.        Judgment: Judgment normal.      Results for orders placed or performed in visit on 06/18/22  Sickle Cell Panel  Result Value Ref Range   Glucose 79 70 - 99 mg/dL   BUN 8 6 - 24 mg/dL   Creatinine, Ser 0.51 (L) 0.57 - 1.00 mg/dL   eGFR 114 >59 mL/min/1.73   BUN/Creatinine Ratio 16 9 - 23   Sodium 139 134 - 144 mmol/L   Potassium 4.6 3.5 - 5.2 mmol/L   Chloride 106 96 - 106 mmol/L   CO2 18 (L) 20 - 29 mmol/L   Calcium 9.4 8.7 - 10.2 mg/dL   Total Protein 7.2 6.0 - 8.5 g/dL   Albumin 4.4 3.9 - 4.9 g/dL   Globulin, Total 2.8 1.5 - 4.5 g/dL   Albumin/Globulin Ratio 1.6 1.2 - 2.2   Bilirubin Total 2.8 (H) 0.0 - 1.2 mg/dL   Alkaline Phosphatase 64 44 - 121 IU/L   AST 33 0 - 40 IU/L   ALT 15 0 - 32 IU/L   Ferritin 1,756 (H) 15 - 150 ng/mL   Vit D, 25-Hydroxy 20.1 (L) 30.0 - 100.0 ng/mL   WBC 13.9 (H) 3.4 - 10.8 x10E3/uL   RBC 2.38 (LL) 3.77 - 5.28  x10E6/uL   Hemoglobin 6.6 (LL) 11.1 - 15.9 g/dL   Hematocrit 19.4 (L) 34.0 - 46.6 %   MCV 82 79 - 97 fL   MCH 27.7 26.6 - 33.0 pg   MCHC 34.0 31.5 - 35.7 g/dL   RDW 21.7 (H) 11.7 - 15.4 %   Platelets 623 (H) 150 - 450 x10E3/uL   Neutrophils 64 Not Estab. %   Lymphs 19 Not Estab. %   Monocytes 11 Not Estab. %   Eos 4 Not Estab. %   Basos 1 Not Estab. %   Neutrophils Absolute 8.9 (H) 1.4 - 7.0 x10E3/uL   Lymphocytes Absolute 2.6 0.7 - 3.1 x10E3/uL   Monocytes Absolute 1.6 (H) 0.1 - 0.9 x10E3/uL   EOS (ABSOLUTE) 0.6 (H) 0.0 - 0.4 x10E3/uL   Basophils Absolute 0.1 0.0 - 0.2 x10E3/uL   Immature Granulocytes 1 Not Estab. %   Immature Grans (Abs) 0.1 0.0 - 0.1 x10E3/uL   NRBC 1 (H) 0 - 0 %   Hematology Comments: Note:    Retic Ct Pct 12.0 (H) 0.6 - 2.6 %  [id number] 11+Oxyco+Alc+Crt-Bund  Result Value Ref Range   Ethanol Negative Cutoff=0.020 %   Amphetamines, Urine Negative Cutoff=1000 ng/mL   Barbiturate Negative Cutoff=200 ng/mL   BENZODIAZ UR QL Negative Cutoff=200 ng/mL   Cannabinoid Quant, Ur Negative Cutoff=50 ng/mL   Cocaine (Metabolite) Negative Cutoff=300 ng/mL   OPIATE SCREEN URINE Negative Cutoff=300 ng/mL   Oxycodone/Oxymorphone, Urine See Final Results Cutoff=300 ng/mL   Phencyclidine Negative Cutoff=25 ng/mL   Methadone Screen, Urine Negative Cutoff=300 ng/mL   Propoxyphene Negative Cutoff=300 ng/mL   Meperidine Negative  Cutoff=200 ng/mL   Tramadol Negative Cutoff=200 ng/mL   Creatinine 41.5 20.0 - 300.0 mg/dL   pH, Urine 5.9 4.5 - 8.9  Oxycodone/Oxymorphone, Confirm  Result Value Ref Range   OXYCODONE/OXYMORPH Positive (A) Cutoff=300   OXYCODONE Positive (A)    OXYCODONE 563 Cutoff=300 ng/mL   OXYMORPHONE Positive (A)    OXYMORPHONE (GC/MS) 874 Cutoff=300 ng/mL    Last CBC Lab Results  Component Value Date   WBC 13.9 (H) 06/18/2022   HGB 6.6 (LL) 06/18/2022   HCT 19.4 (L) 06/18/2022   MCV 82 06/18/2022   MCH 27.7 06/18/2022   RDW 21.7 (H) 06/18/2022    PLT 623 (H) [id number]   Last metabolic panel Lab Results  Component Value Date   GLUCOSE 79 06/18/2022   NA 139 06/18/2022   K 4.6 06/18/2022   CL 106 06/18/2022   CO2 18 (L) 06/18/2022   BUN 8 06/18/2022   CREATININE 0.51 (L) 06/18/2022   GFRNONAA >60 03/21/2022   CALCIUM 9.4 06/18/2022   PROT 7.2 06/18/2022   ALBUMIN 4.4 06/18/2022   LABGLOB 2.8 06/18/2022   AGRATIO 1.6 06/18/2022   BILITOT 2.8 (H) 06/18/2022   ALKPHOS 64 06/18/2022   AST 33 06/18/2022   ALT 15 06/18/2022   ANIONGAP 6 03/21/2022   Last lipids Lab Results  Component Value Date   CHOL 143 11/25/2014   HDL 27 (L) 11/25/2014   LDLCALC 89 11/25/2014   TRIG 137 11/25/2014   CHOLHDL 5.3 11/25/2014   Last hemoglobin A1c Lab Results  Component Value Date   HGBA1C 4.4 (L) 03/20/2017   Last thyroid functions Lab Results  Component Value Date   TSH 0.099 (L) 03/20/2017   Last vitamin D Lab Results  Component Value Date   VD25OH 20.1 (L) 06/18/2022   Last vitamin B12 and Folate Lab Results  Component Value Date   FOLATE 9.5 03/02/2018      The ASCVD Risk score (Arnett DK, et al., 2019) failed to calculate for the following reasons:   Cannot find a previous HDL lab   Cannot find a previous total cholesterol lab    Assessment & Plan:   Problem List Items Addressed This Visit       Other   Chronic pain syndrome (Chronic)   Relevant Medications   gabapentin (NEURONTIN) 300 MG capsule   Oxycodone HCl 10 MG TABS   aspirin EC 81 MG tablet   Other Relevant Orders   [id number] 11+Oxyco+Alc+Crt-Bund (Completed)   Hb-SS disease without crisis ([place]) - Primary   Relevant Orders   Sickle Cell Panel (Completed)   Anemia of chronic disease   Other Visit Diagnoses     Vitamin D deficiency       Relevant Orders   Sickle Cell Panel (Completed)   Drug-induced constipation       Relevant Medications   polyethylene glycol powder (GLYCOLAX/MIRALAX) 17 GM/SCOOP powder      1. Hb-SS disease without  crisis ([place])  - Sickle Cell Panel  2. Vitamin D deficiency  - Sickle Cell Panel  3. Chronic pain syndrome  - [id number] 11+Oxyco+Alc+Crt-Bund - gabapentin (NEURONTIN) 300 MG capsule; TAKE 1 CAPSULE(300 MG) BY MOUTH THREE TIMES DAILY  Dispense: 90 capsule; Refill: 2 - Oxycodone HCl 10 MG TABS; Take 1 tablet (10 mg total) by mouth every 4 (four) hours while awake.  Dispense: 90 tablet; Refill: 0  4. Anemia of chronic disease Continue folic acid.  Discussed chronic transfusions at length.  5. Drug-induced constipation  -  polyethylene glycol powder (GLYCOLAX/MIRALAX) 17 GM/SCOOP powder; Take 17 g by mouth 2 (two) times daily as needed.  Dispense: 3350 g; Refill: 1  Return in about 3 months (around 09/17/2022).   [name]  APRN, MSN, FNP-C [facility] [address] [place], [place] [address] [phone]

## 2022-06-19 ENCOUNTER — Encounter (HOSPITAL_BASED_OUTPATIENT_CLINIC_OR_DEPARTMENT_OTHER): Payer: Medicaid Other | Admitting: General Surgery

## 2022-06-19 DIAGNOSIS — L97818 Non-pressure chronic ulcer of other part of right lower leg with other specified severity: Secondary | ICD-10-CM | POA: Diagnosis not present

## 2022-06-19 LAB — CMP14+CBC/D/PLT+FER+RETIC+V...

## 2022-06-19 NOTE — Progress Notes (Signed)
GISELA, LEA ([id number]) [id number] Page 1 of 14 Visit Report for 06/19/2022 Chief Complaint Document Details Patient Name: Date of Service: [name] [name] 06/19/2022 8:45 A M Medical Record Number: [id number] Patient Account Number: [id number] Date of Birth/Sex: Treating RN: 02-26-1971 (52 y.o. F) Primary Care Provider: [name] Other Clinician: Referring Provider: Treating Provider/Extender: Elyse Jarvis Weeks in Treatment: 6 Information Obtained from: Patient Chief Complaint the patient is here for evaluation of her bilateral lower extremity sickle cell ulcers 04/17/2021; patient comes in for substantial wounds on the right and left lower leg Electronic Signature(s) Signed: 06/19/2022 9:10:12 AM By: Fredirick Maudlin MD FACS Entered By: Fredirick Maudlin on 06/19/2022 09:10:12 -------------------------------------------------------------------------------- HPI Details Patient Name: Date of Service: [name], [name] 06/19/2022 8:45 A M Medical Record Number: [id number] Patient Account Number: [id number] Date of Birth/Sex: Treating RN: 1971/03/01 (52 y.o. F) Primary Care Provider: [name] Other Clinician: Referring Provider: Treating Provider/Extender: Elyse Jarvis Weeks in Treatment: 33 History of Present Illness Location: medial and lateral ankle region on the right and left medial malleolus Quality: Patient reports experiencing a shooting pain to affected area(s). Severity: Patient states wound(s) are getting worse. Duration: right lower extremity bimalleolar ulcers have been present for approximately 2 years; the rright meedial malleolus ulcer has been there proximally 6 months Timing: Pain in wound is constant (hurts all the time) Context: The wound would happen gradually ssociated Signs and Symptoms: Patient reports having increase discharge. A HPI Description: 52 year old patient with  a history of sickle cell anemia who was last seen by me with ulceration of the right lower extremity above the ankle and was referred to Dr. Leland Johns for a surgical debridement as I was unable to do anything in the office due to excruciating pain. At that stage she was referred from the plastic surgery service to dermatology who treated her for a skin infection with doxycycline and then Levaquin and a local antibiotic ointment. I understand the patient has since developed ulceration on the left ankle both medial and lateral and was now referred back to the wound center as dermatology has finished the management. I do not have any notes from the dermatology department Old notes: 52 year old patient with a history of sickle cell anemia, pain bilateral lower extremities, right lower extremity ulcer and has a history of receiving a skin graft( Theraskin) several months ago. She has been visiting the wound center [facility] and was seen by Dr. Dellia Nims and Dr. Leland Johns. after prolonged conservator therapy between July 2016 and January 2017. She had been seen by the plastic surgeon and taken to the OR for debridement and application of Theraskin. She had 3 applications of Theraskin and was then treated with collagen. Prior to that she had a history of similar problems in 2014 and was treated conservatively. Had a reflux study done for the right lower extremity in August 2016 without reflux or DVT . Past medical history significant for sickle cell disease, anemia, leg ulcers, cholelithiasis,and has never been a smoker. Once the patient was discharged on the wound center she says within 2 or 3 weeks the problems recurred and she has been treating it conservatively. since I saw her 3 weeks ago at [facility] she has been unable to get her dressing material but has completed a course of doxycycline. 6/7/ 2017 -- lower extremity venous duplex reflux evaluation was done No evidence of SVT or DVT in the RLL. No  venous incompetence in the RLL. No further  vascular workup is indicated at this time. She was seen by Dr. [name], on 10/04/2015. She agreed with the plan of taking her to the OR for debridement and application of theraskin and would also take biopsies to rule out pyoderma gangrenosum. [name], [name] ([id number]) [id number] Page 2 of 14 Follow-up note dated May 31 received and she was status post application of Theraskin to multiple ulcers around the right ankle. Pathology did not show evidence of malignancy or pyoderma gangrenosum. She would continue to see as in the wound clinic for further care and see Dr. Leland Johns as needed. The patient brought the biopsy report and it was consistent with stasis ulcer no evidence of malignancy and the comment was that there was some adjacent neovascularization, fibrosis and patchy perivascular chronic inflammation. 11/15/2015 -- today we applied her first application of Theraskin 11/30/15; TheraSkin #2 12/13/2015 -- she is having a lot of pain locally and is here for possible application of a theraskin today. 01/16/2016 -- the patient has significant pain and has noticed despite in spite of all local care and oral pain medication. It is impossible to debride her in the office. 02/06/2016 -- I do not see any notes from Dr. Iran Planas( the patient has not made a call to the office know as she heard from them) and the only visit to recently was with her PCP Dr. Danella Penton -- I saw her on 01/16/2016 and prescribed 90 tablets of oxycodone 10 mg and did lab work and screening for HIV. the HIV was negative and hemoglobin was 6.3 with a WBC count of 14.9 and hematocrit of 17.8 with platelets of 561. 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 The bimalleolar ulcers to the right lower extremity have been present for  approximately 2 years, the left medial malleolus ulcer has been present approximately 6 months. She has followed with Dr.Thimmappa in the past and has had a total of 3 applications of Theraskin (01/2015, 09/2015, 06/17/16). She has also followed with Dr. Con Memos here in the clinic and has received 2 applications of TheraSkin (11/10/15, 11/30/15). The patient does experience chronic, and is not amenable to debridement. 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 She is not currently on any antibiotic therapy and has not been treated with any recently. 07/17/2016 -- was seen by Dr. Iran Planas of plastic surgery who saw her 2 weeks postop application of Theraskin #3. She had removed her dressing and asked her to apply silver alginate on alternate days and follow-up back with the wound center. Future debridements and application of skin substitute would have to be done in the hospital due to her high risk for anesthesia. READMISSION 04/17/2021 Patient is now a 52 year old woman that we have had in this clinic for a prolonged period of time and 2016-2017 and then again for 2 visits in February 2018. At that point she had wounds on the right lower leg predominantly medial. She had also been seen by plastic surgery Dr. Leland Johns who I believe took her to the OR for operative debridement and application of TheraSkin in 2017. After she left our clinic she was followed for a very prolonged period of time in the wound care center in [facility] who then referred her ultimately to [facility] where she was seen by Dr. Vernona Rieger. Again taken her to the OR for skin grafting which apparently did  not take. She had multiple other attempts at dressings although I have not really looked over all of these notes in great detail. She has not been seen in a wound care center in about a year. She states over the last year in addition to her right lower leg she has developed wounds on the left lower leg  quite extensive. She is using Xeroform to all of these wounds without really any improvement. She also has Medicaid which does not cover wound products. The patient has had vascular work-ups in the past including most recently on 03/28/2021 showing biphasic waveforms on the right triphasic at the PTA and biphasic at the dorsalis pedis on the left. She was unable to tolerate any degree of compression to do ABIs. Unfortunately TBI's were also not done. She had venous reflux studies done in 2017. This did not show any evidence of a DVT or SVT and no venous incompetence was noted in the right leg at the time this was the only side with the wound As noted I did not look all over her old records. She apparently had a course of HBO and [facility] although I am not sure what the indication would have been. In any case she developed seizures and terminated treatment earlier. She is generally much more disabled than when we last saw her in clinic. She can no longer walk pretty much wheelchair-bound because predominantly of pain in the left hip. 04/24/2021; the patient tolerated the wraps we put on. We used Santyl and Hydrofera Blue under compression. I brought her back for a nurse visit for a change in dressing. With Medicaid we will have a hard time getting anything paid for and hence the need for compression. She arrives in clinic with all the wounds looking somewhat better in terms of surface 12/20; circumferential wound on the right from the lateral to the medial. She has open areas on the left medial and left lateral x2 on all of this with the same surface. This does not look completely healthy although she does have some epithelialization. She is not complaining of a lot of pain which is unusual for her sickle ulcers. I have not looked over her extensive records from [facility]. She had recent arterial studies and has a history of venous reflux studies I will need to look these over although I do not believe she  has significant arterial disease 2023 05/22/2021; patient's wound areas measure slightly smaller. Still a lot of drainage coming from the right we have been using Hydrofera Blue and Santyl with some improvement in the wound surfaces. She tells me she will be getting transfused later in the week for her underlying sickle cell anemia I have looked over her recent arterial studies which were done in the fall. This was in November and showed biphasic and triphasic waveforms but she could not tolerate ABIs because of pressure and unfortunately TBI's were not done. She has not had recent venous reflux studies that I can see 1/10; not much change about the same surface area. This has a yellowish surface to it very gritty. We have been using Santyl and Hydrofera Blue for a prolonged period. Culture I did last week showed methicillin sensitive staph aureus "rare". Our intake nurse reports greenish drainage which may be the Hydrofera Blue itself 1/17; wounds are continue to measure smaller although I am not sure about the accuracy here. Especially the areas on the right are covered in what looks to be a nonviable surface although she  does have some epithelialization. Similarly she has areas on the left medial and left lateral ankle area which appear to have a better surface and perhaps are slightly smaller. We have been using Santyl and Hydrofera Blue. She cannot tolerate mechanical debridements She went for her reflux studies which showed significant reflux at the greater saphenous vein at the saphenofemoral junction as well as the greater saphenous vein in the proximal calf on the left she had reflux in the thigh and the common femoral vein and supra vein Fishel vein reflux in the greater saphenous vein. I will have vein and vascular look at this. My thoughts have been that these are likely sickle wounds. I looked through her old records from [facility] wound care center and then when she graduated to [facility] wound care center where she saw Dr. Zigmund Daniel and Dr. Vernona Rieger. Although I can see she had reflux studies done I do not see that she actually saw a vein and vascular. I went over the fact that she had operative debridements and actual skin grafting that did not take. I do not think these wounds have ever really progressed towards healing 1/31;Substantial wounds on the right ankle area. Hyper granulated very gritty adherent debris on the surface. 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 On the left she has no DVT or SVT deep vein reflux in the common femoral vein. Superficial vein reflux in the greater saphenous vein on although the vein diameter is not really all that large. I do not think there is anything that can be done with these although I am going to send her for consultation to vein and vascular. 2/7; Wound exam; substantial wound area on the right posterior ankle area and areas on the left medial ankle and left lateral ankle. I was able to debride the left medial ankle last week fairly aggressively and it is back this week to a completely nonviable surface She will see vascular surgery this Friday and I would like them to review the venous studies and also any comments on her arterial status. If they do not see an issue here I am going to refer her to plastic surgery for an operative debridement perhaps intraoperative ACell or Integra. Eventually she will require a deep [name], [name] ([id number]) [id number] Page 3 of 14 tissue culture again 2/14; substantial wound area on the right posterior  ankle, medial ankle. We have been using silver alginate The patient was seen by vein and vascular she had both venous reflux studies and arterial studies. 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 She had no evidence of superficial venous thrombosis. She did have superficial vein reflux at the saphenofemoral junction and the greater saphenous vein. On the left no evidence of a DVT no evidence of superficial venous throat thrombosis she did have deep vein reflux in the common femoral vein and superficial vein reflux in the greater saphenous vein but these were not felt to be amenable to ablation. In terms of arterial studies she had triphasic and wife  biphasic wave waveforms bilaterally not felt to have a significant arterial issue. I do not get the feeling that they felt that any part of her nonhealing wounds were related to either arterial or venous issues. They did note that she had venous reflux at the right at the [facility] and [facility]. And also on the left there were reflux in the deep system at the common femoral vein and greater saphenous vein in the proximal thigh. Nothing amenable to ablation. 2/20; she is making some decent progress on the right where there is nice skin between the 2 open areas on the right ankle. The surfaces here do not look viable yet there is some surrounding epithelialization. She still has a small area on the left medial ankle area. Hyper-granulated [name] 2/28 patient has an appointment with plastic surgery on 3/8. We will see her back on 3/9. She may have to call us to get the area redressed. We've been using Santyl under silver alginate. We made a nice improvement on the left medial ankle. The larger wounds on the right also looks somewhat better in terms of epithelialization although I think they could benefit from an aggressive debridement if plastic surgery would be willing to do that.  Perhaps placement of Integra or a cell 07/26/2021: She saw Dr. Claudia Desanctis yesterday. He raised the question as to whether or not this might be pyoderma and wanted to wait until that question was answered by dermatology before proceeding with any sort of operative debridement. We have continue to use Santyl under silver alginate with Kerlix and Coban wraps. Overall, her wounds appear to be continuing to contract and epithelialize, with some granulation tissue present. There continues to be some slough on all wound surfaces. 08/09/2021: She has not been able to get an appointment with dermatology because apparently the offices in [place] do not accept Medicaid. She is looking into whether or not she can be seen at the main [facility] dermatology clinic. This is necessary because plastic surgery is concerned that her wounds might represent pyoderma and they did not want to do any procedure until that was clarified. We have been using Santyl under silver alginate with Kerlix and Coban wraps. Today, there was a greater amount of drainage on her dressings with a slight green discoloration and significant odor. Despite this, her wounds continue to contract and epithelialize. There is pale granulation tissue present and actually, on the left medial ankle, the granulation tissue is a bit hypertrophic. 08/16/2021: Last week, I took a culture and this grew back rare methicillin-resistant Staph aureus and rare corynebacterium. The MRSA was sensitive to gentamicin which we began applying topically on an empiric basis. This week, her wounds are a bit smaller and the drainage and odor are less. Her primary care provider is working on assisting the patient with a dermatology evaluation. She has been in silver alginate over the gentamicin that was started last week along with Kerlix and Coban wraps. 08/23/2021: Because she has Medicaid, we have been unable to get her into see any dermatologist in the  [place] to rule out pyoderma gangrenosum, which was a requirement from plastic surgery prior to any sort of debridement and grafting. Despite this, however, all of her wounds continue to get smaller. The wound on her left medial ankle is nearly closed. There is no odor from the wounds, although she still accumulates a modest amount of drainage on her dressings. 08/30/2021: The lateral right ankle wound and the  medial left ankle wound are a bit smaller today. The medial right ankle wound is about the same size. They are less tender. We have still been unable to get her into dermatology. 09/06/2021: All of the wounds are about the same size today. She continues to endorse minimal pain. I communicated with Dr. Claudia Desanctis in plastic surgery regarding our issues getting a dermatology appointment; he was out of town but indicated that he would look into perhaps performing the biopsy in his office and will have his office contact her. 09/14/2021: The patient has an appointment in dermatology, but it is not until October. Her wounds are roughly the same; she continues to have very thick purulent-looking drainage on her dressings. 09/20/2021: The left medial wound is nearly closed and just has a bit of accumulated eschar on the surface. The right medial and lateral ankle wounds are perhaps a little bit smaller. They continue to have a very pale surface with accumulation of thin slough. 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 I did not think [employer] was indicated based on this. She is getting topical mupirocin with Prisma silver collagen. 10/04/2021: The patient was not seen in clinic last week due to childcare coverage issues. In the interim, the left medial leg wound has closed. The right sided leg wounds are smaller. There is more granulation tissue coming through, particularly on the lateral wound. The surface remains somewhat gritty. We have been applying topical mupirocin and Prisma silver  collagen. 10/11/2021: The left medial leg wound remains closed. She does complain of some anesthetic sensation to the area. Both of the right-sided leg wounds are smaller but still have accumulated slough. 10/18/2021: Both right-sided leg wounds are minimally smaller this week. She still continues to accumulate slough and has thick drainage on her dressings. 10/23/2021: Both wounds continue to contract. There is still slough buildup. She has been approved for a keratin-based skin substitute trial product but it will not be available until next week. 10/30/2021: The wounds are about the same to perhaps slightly smaller. There is still continued slough buildup. Unfortunately, the rep for the keratin based product did not show up today and did not answer his phone when called. 11/08/2021: The wounds are little bit smaller today. She continues to have thick drainage but the surfaces are relatively clean with just a little bit of slough accumulation. She reported to me today that she is unable to completely flex her left ankle and on examination it seems this is potentially related to scar tissue from her wounds. We do have the ProgenaMatrix trial product available for her today. 11/15/2021: Both wounds are smaller today. There is some slough accumulation on the surfaces, but the medial wound, in particular looks like it is filling in and is less deep. She did hear from physical therapy and she is going to start working with them on July 11. She is here for her second application of the trial skin substitute, ProgenaMatrix. 11/22/2021: Both wounds continue to contract, the medial more dramatically than the lateral. Both wounds have a layer of slough on the surface, but underneath this, the gritty fibrous tissue has a little bit more of a pink cast to it rather than being as pale as it has been. 11/29/2021: The wounds are roughly the same size this week, perhaps a millimeter or 2 smaller. The medial wound has filled  in and is nearly flush with the surrounding skin surface. She continues to have a lot of  slough accumulation on both surfaces. 12/06/2021: No significant change to her wounds, but she has a new opening on her dorsal foot, just distal to the right lateral ankle wound. The area on her left medial ankle that reopened looks a little bit larger today. She has quite a bit of pain associated with the new wound. 12/12/2021: Her wounds look about the same but the new opening on her right lateral dorsal foot is a little bit bigger. She continues to have a fair amount of pain with this wound. 12/26/2021: The left medial ankle wound is tiny and superficial. She has 2 areas of crusting on her left lateral ankle, however, that appear to be threatening to [name], [name] ([id number]) [phone].pdf Page 4 of 14 open again. Her right medial ankle wound is a little bit smaller today but still continues to accumulate thick rubbery slough. The new dorsal foot wound is exquisitely painful but there is no odor or purulent drainage. No erythema or induration. The right lateral ankle wound looks about the same today, again with thick rubbery slough. 01/03/2022: The left medial ankle wound has closed again. Both right ankle wounds appear to be about the same size with thick rubbery slough. The dorsal foot wound on the right continues to be quite painful and she stated that she did not want any debridement of that site today. 01/10/2022: No real change to any of her wounds. She continues to accumulate thick slough. The dorsal foot wound has merged with the lateral malleolar wound. She is experiencing significant pain in the dorsal foot portion of the ulcer. 01/16/2022: Absolutely no change or progress in her wounds. 01/24/2022: Her wounds are unchanged. She continues to build up slough and the wounds on her dorsal right foot are still exquisitely tender. 01/31/2022: The wounds actually measure a little bit  narrower today. They still have thick slough on the surface but the underlying tissue seems a little less fibrotic. We changed to Iodosorb last week. 02/07/2022: Wounds continue to slowly epithelialize around the parameters. She has less pain today. She still accumulates a fairly substantial layer of slough. 02/15/2022: No change to the wounds overall. The measured a little bit larger today per the intake nurse. She continues to have substantial slough accumulation and her pain is a little bit worse. 02/21/2022. No change at all to her wounds. I do not see that plastic surgery has received her referral yet. 02/28/2022: The wounds remain unchanged. They are dry and fibrotic with accumulation of slough and eschar. She did receive an appointment to see plastic surgery on October 23, but the office called her back and indicated they needed to reschedule and that the next available appointment was not until December. The patient became angry and decided she did not want to see this plastics group. 10/19; the patient sees Dr. Lovett Calender again next week. She is using Medihoney as the primary dressing changing this herself. 03/21/2022: She saw Dr. Jaclynn Guarneri at the wound care center at [facility]. Dr. Jaclynn Guarneri was also unable to debride her, secondary to pain and is planning to take her to the operating room for operative debridement and potential skin substitute placement. Her wounds are unchanged with thick yellow slough and a fibrotic base. She says they are too painful to be debrided. In addition, yesterday her hemoglobin was 4 and she received 2 units packed red blood cell transfusion. 04/04/2022: Her operation at [facility] is scheduled for December 15. She continues to use Medihoney on her  wounds. They are a little bit less painful today and she is willing to entertain the possibility of debridement. The wounds measured a little bit larger in all dimensions today but the layer of slough is  not as thick as usual. 04/18/2022: Her wounds actually measured a little bit smaller today and the extension onto the dorsal foot from the lateral wound has healed. They are less tender and there is significantly less slough present as compared to prior visits. 05/09/2022: She had to reschedule her surgery due to lack of childcare. It is now planned for January 5. The wounds are basically unchanged, but she had a lot more drainage, which was thick and somewhat purulent in appearance. No significant odor. 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 They are quite painful today and she does not want to have debridement performed. 05/30/2022: The patient underwent surgical debridement and placement of TheraSkin to her wounds on January 5. She has been doing well and does not endorse any pain. The TheraSkin is intact and looks beautiful. It is sutured in place. There is no exudate or significant drainage. 06/04/2022: Her TheraSkin remains intact and I am starting to see granulation tissue emerging underneath the surface. No concern for infection. 06/12/2022: The TheraSkin is intact and there is more granulation tissue emerging. It is starting to look a little bit dry, however. No malodor or purulent drainage. 06/19/2022: The TheraSkin continues to look good. The edges are a bit dry, but the central portion of each of her wounds is showing a nice pink color. Electronic Signature(s) Signed: 06/19/2022 9:11:08 AM By: Fredirick Maudlin MD FACS Entered By: Fredirick Maudlin on 06/19/2022 09:11:07 -------------------------------------------------------------------------------- Physical Exam Details Patient Name: Date of Service: [name], [name] 06/19/2022 8:45 A M Medical Record Number: [id number] Patient Account Number: [id number] Date of Birth/Sex: Treating RN: 1971-01-25 (52 y.o. F) Primary Care Provider: [name] Other Clinician: Referring Provider: Treating Provider/Extender: Elyse Jarvis Weeks in Treatment: 7 Constitutional . . . . no acute distress. Respiratory Normal work of breathing on room air. Notes MOZELLE, REMLINGER ([id number]) [id number] Page 5 of 14 06/19/2022: The TheraSkin continues to look good. The edges are a bit dry, but the central portion of each of her wounds is showing a nice pink color. Electronic Signature(s) Signed: 06/19/2022 9:11:33 AM By: Fredirick Maudlin MD FACS Entered By: Fredirick Maudlin on 06/19/2022 09:11:33 -------------------------------------------------------------------------------- Physician Orders Details Patient Name: Date of Service: [name], [name] 06/19/2022 8:45 A M Medical Record Number: [id number] Patient Account Number: [id number] Date of Birth/Sex: Treating RN: October 23, 1970 (52 y.o. [name] Primary Care Provider: [name] Other Clinician: Referring Provider: Treating Provider/Extender: Elyse Jarvis Weeks in Treatment: 55 Verbal / Phone Orders: No Diagnosis Coding ICD-10 Coding Code Description L97.818 Non-pressure chronic ulcer of other part of right lower leg with other specified severity 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 Follow-up Appointments ppointment in 1 week. - Dr. Celine Ahr - room 3 Return A Cellular or Tissue Based Products Other Cellular or Tissue Based Products Orders/Instructions: - Theraskin applied by plastic surgeon, leave on until further notice Bathing/ Shower/ Hygiene Do not shower or bathe in tub. Edema Control - Lymphedema / SCD / Other Avoid standing for long periods of time. Exercise regularly Additional Orders / Instructions Follow Nutritious Diet Wound Treatment Wound #17 - Lower Leg Wound Laterality: Right, Lateral Cleanser: Soap and Water Every Other Day/30 Days Discharge Instructions: May shower and wash wound with dial antibacterial soap and  water prior to dressing change. Cleanser: Wound  Cleanser Every Other Day/30 Days Discharge Instructions: Cleanse the wound with wound cleanser prior to applying a clean dressing using gauze sponges, not tissue or cotton balls. Peri-Wound Care: Triamcinolone 15 (g) Every Other Day/30 Days Discharge Instructions: Use triamcinolone 15 (g) as directed Peri-Wound Care: Sween Lotion (Moisturizing lotion) Every Other Day/30 Days Discharge Instructions: Apply moisturizing lotion as directed Topical: Skintegrity Hydrogel 4 (oz) Every Other Day/30 Days Discharge Instructions: Apply hydrogel as directed Prim Dressing: [place] 3x4.25 (in/in) Every Other Day/30 Days ary Discharge Instructions: Apply to wound bed as instructed Secondary Dressing: ABD Pad, 5x9 Every Other Day/30 Days Discharge Instructions: Apply over primary dressing as directed. Secured With: Elastic Bandage 4 inch (ACE bandage) Every Other Day/30 Days Discharge Instructions: Secure with ACE bandage as directed. Compression Wrap: Kerlix Roll 4.5x3.1 (in/yd) Every Other Day/30 Days Discharge Instructions: Apply Kerlix and Coban compression as directed. KENZLIE, DISCH ([id number]) [id number] Page 6 of 14 Wound #21 - Ankle Wound Laterality: Right, Medial Cleanser: Soap and Water Every Other Day/30 Days Discharge Instructions: May shower and wash wound with dial antibacterial soap and water prior to dressing change. Cleanser: Wound Cleanser Every Other Day/30 Days Discharge Instructions: Cleanse the wound with wound cleanser prior to applying a clean dressing using gauze sponges, not tissue or cotton balls. Peri-Wound Care: Triamcinolone 15 (g) Every Other Day/30 Days Discharge Instructions: Use triamcinolone 15 (g) as directed Peri-Wound Care: Sween Lotion (Moisturizing lotion) Every Other Day/30 Days Discharge Instructions: Apply moisturizing lotion as directed Prim Dressing: ADAPTIC TOUCH 3x4.25 (in/in) Every Other Day/30 Days ary Discharge  Instructions: Apply to wound bed as instructed Secondary Dressing: ABD Pad, 5x9 Every Other Day/30 Days Discharge Instructions: Apply over primary dressing as directed. Secured With: Elastic Bandage 4 inch (ACE bandage) Every Other Day/30 Days Discharge Instructions: Secure with ACE bandage as directed. Compression Wrap: Kerlix Roll 4.5x3.1 (in/yd) Every Other Day/30 Days Discharge Instructions: Apply Kerlix and Coban compression as directed. Electronic Signature(s) Signed: 06/19/2022 9:31:25 AM By: Fredirick Maudlin MD FACS Entered By: Fredirick Maudlin on 06/19/2022 09:11:57 -------------------------------------------------------------------------------- Problem List Details Patient Name: Date of Service: [name], [name] 06/19/2022 8:45 A M Medical Record Number: [id number] Patient Account Number: [id number] Date of Birth/Sex: Treating RN: 04-26-1971 (52 y.o. F) Primary Care Provider: [name] Other Clinician: Referring Provider: Treating Provider/Extender: Elyse Jarvis Weeks in Treatment: 56 Active Problems ICD-10 Encounter Code Description Active Date MDM Diagnosis L97.818 Non-pressure chronic ulcer of other part of right lower leg with other specified 04/17/2021 No Yes severity 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 04/17/2021 No Yes Inactive Problems ICD-10 Code Description Active Date Inactive Date L97.828 Non-pressure chronic ulcer of other part of left lower leg with other specified severity 04/17/2021 04/17/2021 Resolved Problems MYCAH, MCDOUGALL ([id number]) [phone].pdf Page 7 of 14 Electronic Signature(s) Signed: 06/19/2022 9:09:59 AM By: Fredirick Maudlin MD FACS Entered By: Fredirick Maudlin on 06/19/2022 09:09:59 -------------------------------------------------------------------------------- Progress Note Details Patient Name: Date of Service: [name], [name] 06/19/2022 8:45 A M Medical Record Number:  [id number] Patient Account Number: [id number] Date of Birth/Sex: Treating RN: 04/11/1971 (52 y.o. F) Primary Care Provider: [name] Other Clinician: Referring Provider: Treating Provider/Extender: Elyse Jarvis Weeks in Treatment: 68 Subjective Chief Complaint Information obtained from Patient the patient is here for evaluation of her bilateral lower extremity sickle cell ulcers 04/17/2021; patient comes in for substantial wounds on the right and left lower leg History of Present Illness (HPI) The following HPI elements  were documented for the patient's wound: Location: medial and lateral ankle region on the right and left medial malleolus Quality: Patient reports experiencing a shooting pain to affected area(s). Severity: Patient states wound(s) are getting worse. Duration: right lower extremity bimalleolar ulcers have been present for approximately 2 years; the rright meedial malleolus ulcer has been there proximally 6 months Timing: Pain in wound is constant (hurts all the time) Context: The wound would happen gradually Associated Signs and Symptoms: Patient reports having increase discharge. 52 year old patient with a history of sickle cell anemia who was last seen by me with ulceration of the right lower extremity above the ankle and was referred to Dr. Leland Johns for a surgical debridement as I was unable to do anything in the office due to excruciating pain. At that stage she was referred from the plastic surgery service to dermatology who treated her for a skin infection with doxycycline and then Levaquin and a local antibiotic ointment. I understand the patient has since developed ulceration on the left ankle both medial and lateral and was now referred back to the wound center as dermatology has finished the management. I do not have any notes from the dermatology department Old notes: 52 year old patient with a history of sickle cell anemia, pain  bilateral lower extremities, right lower extremity ulcer and has a history of receiving a skin graft( Theraskin) several months ago. She has been visiting the wound center [facility] and was seen by Dr. Dellia Nims and Dr. Leland Johns. after prolonged conservator therapy between July 2016 and January 2017. She had been seen by the plastic surgeon and taken to the OR for debridement and application of Theraskin. She had 3 applications of Theraskin and was then treated with collagen. Prior to that she had a history of similar problems in 2014 and was treated conservatively. Had a reflux study done for the right lower extremity in August 2016 without reflux or DVT . Past medical history significant for sickle cell disease, anemia, leg ulcers, cholelithiasis,and has never been a smoker. Once the patient was discharged on the wound center she says within 2 or 3 weeks the problems recurred and she has been treating it conservatively. since I saw her 3 weeks ago at [facility] she has been unable to get her dressing material but has completed a course of doxycycline. 6/7/ 2017 -- lower extremity venous duplex reflux evaluation was done oo No evidence of SVT or DVT in the RLL. No venous incompetence in the RLL. No further vascular workup is indicated at this time. She was seen by Dr. [name], on 10/04/2015. She agreed with the plan of taking her to the OR for debridement and application of theraskin and would also take biopsies to rule out pyoderma gangrenosum. Follow-up note dated May 31 received and she was status post application of Theraskin to multiple ulcers around the right ankle. Pathology did not show evidence of malignancy or pyoderma gangrenosum. She would continue to see as in the wound clinic for further care and see Dr. Leland Johns as needed. The patient brought the biopsy report and it was consistent with stasis ulcer no evidence of malignancy and the comment was that there was some  adjacent neovascularization, fibrosis and patchy perivascular chronic inflammation. 11/15/2015 -- today we applied her first application of Theraskin 11/30/15; TheraSkin #2 12/13/2015 -- she is having a lot of pain locally and is here for possible application of a theraskin today. 01/16/2016 -- the patient has significant pain and has noticed despite in  spite of all local care and oral pain medication. It is impossible to debride her in the office. 02/06/2016 -- I do not see any notes from Dr. Iran Planas( the patient has not made a call to the office know as she heard from them) and the only visit to recently was with her PCP Dr. Danella Penton -- I saw her on 01/16/2016 and prescribed 90 tablets of oxycodone 10 mg and did lab work and screening for HIV. the HIV was negative and hemoglobin was 6.3 with a WBC count of 14.9 and hematocrit of 17.8 with platelets of 561. 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 The bimalleolar ulcers to the right lower extremity have been present for approximately 2 years, the left medial malleolus ulcer has been present approximately 6 months. She has followed with Dr.Thimmappa in the past and has had a total of 3 applications of Theraskin (01/2015, 09/2015, 06/17/16). She has also followed with Dr. Con Memos here in the clinic and has received 2 applications of TheraSkin (11/10/15, 11/30/15). The patient does experience chronic, and is not amenable to debridement. 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 She is not currently on any antibiotic therapy and has not been treated with any recently. 07/17/2016 -- was seen by Dr. Iran Planas of plastic surgery who saw her 2 weeks postop application of Theraskin #3. She had removed her dressing and asked her to apply silver alginate on alternate days and follow-up back with the  wound center. Future debridements and application of skin substitute would have to be done in the hospital due to her high risk for anesthesia. [name], [name] ([id number]) [id number] Page 8 of 14 04/17/2021 Patient is now a 52 year old woman that we have had in this clinic for a prolonged period of time and 2016-2017 and then again for 2 visits in February 2018. At that point she had wounds on the right lower leg predominantly medial. She had also been seen by plastic surgery Dr. Leland Johns who I believe took her to the OR for operative debridement and application of TheraSkin in 2017. After she left our clinic she was followed for a very prolonged period of time in the wound care center in [facility] who then referred her ultimately to [facility] where she was seen by Dr. Vernona Rieger. Again taken her to the OR for skin grafting which apparently did not take. She had multiple other attempts at dressings although I have not really looked over all of these notes in great detail. She has not been seen in a wound care center in about a year. She states over the last year in addition to her right lower leg she has developed wounds on the left lower leg quite extensive. She is using Xeroform to all of these wounds without really any improvement. She also has Medicaid which does not cover wound products. The patient has had vascular work-ups in the past including most recently on 03/28/2021 showing biphasic waveforms on the right triphasic at the PTA and biphasic at the dorsalis pedis on the left. She was unable to tolerate any degree of compression to do ABIs. Unfortunately TBI's were also not done. She had venous reflux studies done in 2017. This did not show any evidence of a DVT or SVT and no venous incompetence was noted in the right leg at the time this was the only  side with the wound As noted I did not look all over her old records. She apparently had a course of HBO  and [facility] although I am not sure what the indication would have been. In any case she developed seizures and terminated treatment earlier. She is generally much more disabled than when we last saw her in clinic. She can no longer walk pretty much wheelchair-bound because predominantly of pain in the left hip. 04/24/2021; the patient tolerated the wraps we put on. We used Santyl and Hydrofera Blue under compression. I brought her back for a nurse visit for a change in dressing. With Medicaid we will have a hard time getting anything paid for and hence the need for compression. She arrives in clinic with all the wounds looking somewhat better in terms of surface 12/20; circumferential wound on the right from the lateral to the medial. She has open areas on the left medial and left lateral x2 on all of this with the same surface. This does not look completely healthy although she does have some epithelialization. She is not complaining of a lot of pain which is unusual for her sickle ulcers. I have not looked over her extensive records from [facility]. She had recent arterial studies and has a history of venous reflux studies I will need to look these over although I do not believe she has significant arterial disease 2023 05/22/2021; patient's wound areas measure slightly smaller. Still a lot of drainage coming from the right we have been using Hydrofera Blue and Santyl with some improvement in the wound surfaces. She tells me she will be getting transfused later in the week for her underlying sickle cell anemia I have looked over her recent arterial studies which were done in the fall. This was in November and showed biphasic and triphasic waveforms but she could not tolerate ABIs because of pressure and unfortunately TBI's were not done. She has not had recent venous reflux studies that I can see 1/10; not much change about the same surface area. This has a yellowish surface to it very gritty. We have  been using Santyl and Hydrofera Blue for a prolonged period. Culture I did last week showed methicillin sensitive staph aureus "rare". Our intake nurse reports greenish drainage which may be the Hydrofera Blue itself 1/17; wounds are continue to measure smaller although I am not sure about the accuracy here. Especially the areas on the right are covered in what looks to be a nonviable surface although she does have some epithelialization. Similarly she has areas on the left medial and left lateral ankle area which appear to have a better surface and perhaps are slightly smaller. We have been using Santyl and Hydrofera Blue. She cannot tolerate mechanical debridements She went for her reflux studies which showed significant reflux at the greater saphenous vein at the saphenofemoral junction as well as the greater saphenous vein in the proximal calf on the left she had reflux in the thigh and the common femoral vein and supra vein Fishel vein reflux in the greater saphenous vein. I will have vein and vascular look at this. My thoughts have been that these are likely sickle wounds. I looked through her old records from [facility] wound care center and then when she graduated to [facility] wound care center where she saw Dr. Zigmund Daniel and Dr. Vernona Rieger. Although I can see she had reflux studies done I do not see that she actually saw a vein and vascular. I went  over the fact that she had operative debridements and actual skin grafting that did not take. I do not think these wounds have ever really progressed towards healing 1/31;Substantial wounds on the right ankle area. Hyper granulated very gritty adherent debris on the surface. 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 On the left she has no DVT or SVT deep vein reflux in the common femoral vein. Superficial vein reflux in the greater saphenous vein on although the vein diameter is not really all that large. I do not think there is anything that can be done with these although I am going to send her for consultation to vein and vascular. 2/7; Wound exam; substantial wound area on the right posterior ankle area and areas on the left medial ankle and left lateral ankle. I was able to debride the left medial ankle last week fairly aggressively and it is back this week to a completely nonviable surface She will see vascular surgery this Friday and I would like them to review the venous studies and also any comments on her arterial status. If they do not see an issue here I am going to refer her to plastic surgery for an operative debridement perhaps intraoperative ACell or Integra. Eventually she will require a deep tissue culture again 2/14; substantial wound area on the right posterior ankle, medial ankle. We have been using silver alginate The patient was seen by vein and vascular she had both venous reflux studies and arterial studies. 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 She had no evidence of superficial venous thrombosis. She did have superficial vein reflux at the saphenofemoral junction and the greater saphenous vein. On the left no evidence of a DVT no evidence of superficial venous throat thrombosis she did have deep vein reflux in the common femoral vein and superficial vein reflux in the greater saphenous vein but these were not felt to be amenable to ablation. In terms of arterial studies she had triphasic and wife biphasic wave waveforms bilaterally not felt to have a significant arterial issue. I  do not get the feeling that they felt that any part of her nonhealing wounds were related to either arterial or venous issues. They did note that she had venous reflux at the right at the [facility] and [facility]. And also on the left there were reflux in the deep system at the common femoral vein and greater saphenous vein in the proximal thigh. Nothing amenable to ablation. 2/20; she is making some decent progress on the right where there is nice skin between the 2 open areas on the right ankle. The surfaces here do not look viable yet there is some surrounding epithelialization. She still has a small area on the left medial ankle area. Hyper-granulated [name] 2/28 patient has an appointment with plastic surgery on 3/8. We will see her back on 3/9. She may have to call us to get the area redressed. We've been using Santyl under silver alginate. We made a nice improvement on the left medial ankle.  The larger wounds on the right also looks somewhat better in terms of epithelialization although I think they could benefit from an aggressive debridement if plastic surgery would be willing to do that. Perhaps placement of Integra or a cell 07/26/2021: She saw Dr. Claudia Desanctis yesterday. He raised the question as to whether or not this might be pyoderma and wanted to wait until that question was answered by dermatology before proceeding with any sort of operative debridement. We have continue to use Santyl under silver alginate with Kerlix and Coban wraps. Overall, her wounds appear to be continuing to contract and epithelialize, with some granulation tissue present. There continues to be some slough on all wound surfaces. [name], [name] ([id number]) [id number] Page 9 of 14 08/09/2021: She has not been able to get an appointment with dermatology because apparently the offices in [place] do not accept Medicaid. She is looking into whether or not she can be seen at the  main [facility] dermat[facility] clinic. This is necessary because plastic surgery is concerned that her wounds might represent pyoderma and they did not want to do any procedure until that was clarified. We have been using Santyl under silver alginate with Kerlix and Coban wraps. Today, there was a greater amount of drainage on her dressings with a slight green discoloration and significant odor. Despite this, her wounds continue to contract and epithelialize. There is pale granulation tissue present and actually, on the left medial ankle, the granulation tissue is a bit hypertrophic. 08/16/2021: Last week, I took a culture and this grew back rare methicillin-resistant Staph aureus and rare corynebacterium. The MRSA was sensitive to gentamicin which we began applying topically on an empiric basis. This week, her wounds are a bit smaller and the drainage and odor are less. Her primary care provider is working on assisting the patient with a dermatology evaluation. She has been in silver alginate over the gentamicin that was started last week along with Kerlix and Coban wraps. 08/23/2021: Because she has Medicaid, we have been unable to get her into see any dermatologist in the [place] to rule out pyoderma gangrenosum, which was a requirement from plastic surgery prior to any sort of debridement and grafting. Despite this, however, all of her wounds continue to get smaller. The wound on her left medial ankle is nearly closed. There is no odor from the wounds, although she still accumulates a modest amount of drainage on her dressings. 08/30/2021: The lateral right ankle wound and the medial left ankle wound are a bit smaller today. The medial right ankle wound is about the same size. They are less tender. We have still been unable to get her into dermatology. 09/06/2021: All of the wounds are about the same size today. She continues to endorse minimal pain. I communicated with Dr. Claudia Desanctis in plastic surgery  regarding our issues getting a dermatology appointment; he was out of town but indicated that he would look into perhaps performing the biopsy in his office and will have his office contact her. 09/14/2021: The patient has an appointment in dermatology, but it is not until October. Her wounds are roughly the same; she continues to have very thick purulent-looking drainage on her dressings. 09/20/2021: The left medial wound is nearly closed and just has a bit of accumulated eschar on the surface. The right medial and lateral ankle wounds are perhaps a little bit smaller. They continue to have a very pale surface with accumulation of thin slough. PCR culture done last  week returned with MRSA but fairly low levels. I did not think [employer] was indicated based on this. She is getting topical mupirocin with Prisma silver collagen. 10/04/2021: The patient was not seen in clinic last week due to childcare coverage issues. In the interim, the left medial leg wound has closed. The right sided leg wounds are smaller. There is more granulation tissue coming through, particularly on the lateral wound. The surface remains somewhat gritty. We have been applying topical mupirocin and Prisma silver collagen. 10/11/2021: The left medial leg wound remains closed. She does complain of some anesthetic sensation to the area. Both of the right-sided leg wounds are smaller but still have accumulated slough. 10/18/2021: Both right-sided leg wounds are minimally smaller this week. She still continues to accumulate slough and has thick drainage on her dressings. 10/23/2021: Both wounds continue to contract. There is still slough buildup. She has been approved for a keratin-based skin substitute trial product but it will not be available until next week. 10/30/2021: The wounds are about the same to perhaps slightly smaller. There is still continued slough buildup. Unfortunately, the rep for the keratin based product did not show up  today and did not answer his phone when called. 11/08/2021: The wounds are little bit smaller today. She continues to have thick drainage but the surfaces are relatively clean with just a little bit of slough accumulation. She reported to me today that she is unable to completely flex her left ankle and on examination it seems this is potentially related to scar tissue from her wounds. We do have the ProgenaMatrix trial product available for her today. 11/15/2021: Both wounds are smaller today. There is some slough accumulation on the surfaces, but the medial wound, in particular looks like it is filling in and is less deep. She did hear from physical therapy and she is going to start working with them on July 11. She is here for her second application of the trial skin substitute, ProgenaMatrix. 11/22/2021: Both wounds continue to contract, the medial more dramatically than the lateral. Both wounds have a layer of slough on the surface, but underneath this, the gritty fibrous tissue has a little bit more of a pink cast to it rather than being as pale as it has been. 11/29/2021: The wounds are roughly the same size this week, perhaps a millimeter or 2 smaller. The medial wound has filled in and is nearly flush with the surrounding skin surface. She continues to have a lot of slough accumulation on both surfaces. 12/06/2021: No significant change to her wounds, but she has a new opening on her dorsal foot, just distal to the right lateral ankle wound. The area on her left medial ankle that reopened looks a little bit larger today. She has quite a bit of pain associated with the new wound. 12/12/2021: Her wounds look about the same but the new opening on her right lateral dorsal foot is a little bit bigger. She continues to have a fair amount of pain with this wound. 12/26/2021: The left medial ankle wound is tiny and superficial. She has 2 areas of crusting on her left lateral ankle, however, that appear to be  threatening to open again. Her right medial ankle wound is a little bit smaller today but still continues to accumulate thick rubbery slough. The new dorsal foot wound is exquisitely painful but there is no odor or purulent drainage. No erythema or induration. The right lateral ankle wound looks about the same today, again  with thick rubbery slough. 01/03/2022: The left medial ankle wound has closed again. Both right ankle wounds appear to be about the same size with thick rubbery slough. The dorsal foot wound on the right continues to be quite painful and she stated that she did not want any debridement of that site today. 01/10/2022: No real change to any of her wounds. She continues to accumulate thick slough. The dorsal foot wound has merged with the lateral malleolar wound. She is experiencing significant pain in the dorsal foot portion of the ulcer. 01/16/2022: Absolutely no change or progress in her wounds. 01/24/2022: Her wounds are unchanged. She continues to build up slough and the wounds on her dorsal right foot are still exquisitely tender. 01/31/2022: The wounds actually measure a little bit narrower today. They still have thick slough on the surface but the underlying tissue seems a little less fibrotic. We changed to Iodosorb last week. 02/07/2022: Wounds continue to slowly epithelialize around the parameters. She has less pain today. She still accumulates a fairly substantial layer of slough. 02/15/2022: No change to the wounds overall. The measured a little bit larger today per the intake nurse. She continues to have substantial slough accumulation and her pain is a little bit worse. 02/21/2022. No change at all to her wounds. I do not see that plastic surgery has received her referral yet. 02/28/2022: The wounds remain unchanged. They are dry and fibrotic with accumulation of slough and eschar. She did receive an appointment to see plastic [name], [name] ([id number])  [id number] Page 10 of 14 surgery on October 23, but the office called her back and indicated they needed to reschedule and that the next available appointment was not until December. The patient became angry and decided she did not want to see this plastics group. 10/19; the patient sees Dr. Lovett Calender again next week. She is using Medihoney as the primary dressing changing this herself. 03/21/2022: She saw Dr. Jaclynn Guarneri at the wound care center at [facility]. Dr. Jaclynn Guarneri was also unable to debride her, secondary to pain and is planning to take her to the operating room for operative debridement and potential skin substitute placement. Her wounds are unchanged with thick yellow slough and a fibrotic base. She says they are too painful to be debrided. In addition, yesterday her hemoglobin was 4 and she received 2 units packed red blood cell transfusion. 04/04/2022: Her operation at [facility] is scheduled for December 15. She continues to use Medihoney on her wounds. They are a little bit less painful today and she is willing to entertain the possibility of debridement. The wounds measured a little bit larger in all dimensions today but the layer of slough is not as thick as usual. 04/18/2022: Her wounds actually measured a little bit smaller today and the extension onto the dorsal foot from the lateral wound has healed. They are less tender and there is significantly less slough present as compared to prior visits. 05/09/2022: She had to reschedule her surgery due to lack of childcare. It is now planned for January 5. The wounds are basically unchanged, but she had a lot more drainage, which was thick and somewhat purulent in appearance. No significant odor. 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 They are quite painful today and she does not want to have debridement performed. 05/30/2022: The patient underwent surgical debridement and  placement of TheraSkin to her wounds on January 5. She has been doing well and does not endorse any  pain. The TheraSkin is intact and looks beautiful. It is sutured in place. There is no exudate or significant drainage. 06/04/2022: Her TheraSkin remains intact and I am starting to see granulation tissue emerging underneath the surface. No concern for infection. 06/12/2022: The TheraSkin is intact and there is more granulation tissue emerging. It is starting to look a little bit dry, however. No malodor or purulent drainage. 06/19/2022: The TheraSkin continues to look good. The edges are a bit dry, but the central portion of each of her wounds is showing a nice pink color. Patient History Information obtained from Patient. Family History Diabetes - Mother, Lung Disease - Mother, No family history of Cancer, Heart Disease, Hereditary Spherocytosis, Hypertension, Kidney Disease, Seizures, Stroke, Thyroid Problems, Tuberculosis. Social History Never smoker, Marital Status - Married, Alcohol Use - Never, Drug Use - No History, Caffeine Use - Daily. Medical History Eyes Denies history of Cataracts, Glaucoma, Optic Neuritis Ear/Nose/Mouth/Throat Denies history of Chronic sinus problems/congestion, Middle ear problems Hematologic/Lymphatic Patient has history of Anemia, Sickle Cell Disease Denies history of Hemophilia, Human Immunodeficiency Virus, Lymphedema Respiratory Denies history of Aspiration, Asthma, Chronic Obstructive Pulmonary Disease (COPD), Pneumothorax, Sleep Apnea, Tuberculosis Cardiovascular Denies history of Angina, Arrhythmia, Congestive Heart Failure, Coronary Artery Disease, Deep Vein Thrombosis, Hypertension, Hypotension, Myocardial Infarction, Peripheral Arterial Disease, Peripheral Venous Disease, Phlebitis, Vasculitis Gastrointestinal Denies history of Cirrhosis , Colitis, Crohnoos, Hepatitis A, Hepatitis B, Hepatitis C Endocrine Denies history of Type I Diabetes, Type II  Diabetes Genitourinary Denies history of End Stage Renal Disease Immunological Denies history of Lupus Erythematosus, Raynaudoos, Scleroderma Integumentary (Skin) Denies history of History of Burn Musculoskeletal Denies history of Gout, Rheumatoid Arthritis, Osteoarthritis, Osteomyelitis Neurologic Patient has history of Neuropathy - right foot intermittant Denies history of Dementia, Quadriplegia, Paraplegia, Seizure Disorder Oncologic Denies history of Received Chemotherapy, Received Radiation Psychiatric Denies history of Anorexia/bulimia, Confinement Anxiety Hospitalization/Surgery History - c section x2. - left breast lumpectomy. - iandD right ankle with theraskin. Medical A Surgical History Notes nd Constitutional Symptoms (General Health) H/O miscarriage Cardiovascular bradycardia Gastrointestinal cholilithiasis EMAGENE, MERFELD ([id number]) [id number] Page 11 of 14 Objective Constitutional no acute distress. Vitals Time Taken: 8:50 AM, Height: 67 in, Temperature: 98.5 F, Pulse: 83 bpm, Respiratory Rate: 16 breaths/min, Blood Pressure: 122/65 mmHg. Respiratory Normal work of breathing on room air. General Notes: 06/19/2022: The TheraSkin continues to look good. The edges are a bit dry, but the central portion of each of her wounds is showing a nice pink color. Integumentary (Hair, Skin) Wound #17 status is Open. Original cause of wound was Gradually Appeared. The date acquired was: 10/05/2012. The wound has been in treatment 61 weeks. The wound is located on the Right,Lateral Lower Leg. The wound measures 6.4cm length x 5cm width x 0.1cm depth; 25.133cm^2 area and 2.513cm^3 volume. There is Fat Layer (Subcutaneous Tissue) exposed. There is no tunneling or undermining noted. There is a medium amount of serosanguineous drainage noted. The wound margin is distinct with the outline attached to the wound base. There is medium (34-66%) granulation  within the wound bed. There is a medium (34- 66%) amount of necrotic tissue within the wound bed. The periwound skin appearance had no abnormalities noted for moisture. The periwound skin appearance had no abnormalities noted for color. The periwound skin appearance exhibited: Scarring. Periwound temperature was noted as No Abnormality. Wound #21 status is Open. Original cause of wound was Gradually Appeared. The date acquired was: 06/26/2021. The wound has been in treatment 51 weeks. The wound is located on  the Right,Medial Ankle. The wound measures 7.3cm length x 3.4cm width x 0.1cm depth; 19.494cm^2 area and 1.949cm^3 volume. There is Fat Layer (Subcutaneous Tissue) exposed. There is no tunneling or undermining noted. There is a medium amount of serosanguineous drainage noted. The wound margin is distinct with the outline attached to the wound base. There is medium (34-66%) pink granulation within the wound bed. There is a medium (34-66%) amount of necrotic tissue within the wound bed including Eschar and Adherent Slough. The periwound skin appearance had no abnormalities noted for moisture. The periwound skin appearance had no abnormalities noted for color. The periwound skin appearance exhibited: Scarring. Periwound temperature was noted as No Abnormality. 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 - Dr. Celine Ahr - room 3 Cellular or Tissue Based Products: Other Cellular or Tissue Based Products Orders/Instructions: - Theraskin applied by Psychiatric nurse, leave on until further notice Bathing/ Shower/ Hygiene: Do not shower or bathe in tub. Edema Control - Lymphedema / SCD / Other: Avoid standing for long periods of time. Exercise regularly Additional Orders / Instructions: Follow Nutritious Diet WOUND #17: - Lower  Leg Wound Laterality: Right, Lateral Cleanser: Soap and Water Every Other Day/30 Days Discharge Instructions: May shower and wash wound with dial antibacterial soap and water prior to dressing change. Cleanser: Wound Cleanser Every Other Day/30 Days Discharge Instructions: Cleanse the wound with wound cleanser prior to applying a clean dressing using gauze sponges, not tissue or cotton balls. Peri-Wound Care: Triamcinolone 15 (g) Every Other Day/30 Days Discharge Instructions: Use triamcinolone 15 (g) as directed Peri-Wound Care: Sween Lotion (Moisturizing lotion) Every Other Day/30 Days Discharge Instructions: Apply moisturizing lotion as directed Topical: Skintegrity Hydrogel 4 (oz) Every Other Day/30 Days Discharge Instructions: Apply hydrogel as directed Prim Dressing: [place] 3x4.25 (in/in) Every Other Day/30 Days ary Discharge Instructions: Apply to wound bed as instructed Secondary Dressing: ABD Pad, 5x9 Every Other Day/30 Days Discharge Instructions: Apply over primary dressing as directed. Secured With: Elastic Bandage 4 inch (ACE bandage) Every Other Day/30 Days Discharge Instructions: Secure with ACE bandage as directed. Com pression Wrap: Kerlix Roll 4.5x3.1 (in/yd) Every Other Day/30 Days Discharge Instructions: Apply Kerlix and Coban compression as directed. WOUND #21: - Ankle Wound Laterality: Right, Medial Cleanser: Soap and Water Every Other Day/30 Days Discharge Instructions: May shower and wash wound with dial antibacterial soap and water prior to dressing change. Cleanser: Wound Cleanser Every Other Day/30 Days Discharge Instructions: Cleanse the wound with wound cleanser prior to applying a clean dressing using gauze sponges, not tissue or cotton balls. Peri-Wound Care: Triamcinolone 15 (g) Every Other Day/30 Days Discharge Instructions: Use triamcinolone 15 (g) as directed [name] ([id number]) [id number] Page 12 of  14 Peri-Wound Care: Sween Lotion (Moisturizing lotion) Every Other Day/30 Days Discharge Instructions: Apply moisturizing lotion as directed Prim Dressing: ADAPTIC TOUCH 3x4.25 (in/in) Every Other Day/30 Days ary Discharge Instructions: Apply to wound bed as instructed Secondary Dressing: ABD Pad, 5x9 Every Other Day/30 Days Discharge Instructions: Apply over primary dressing as directed. Secured With: Elastic Bandage 4 inch (ACE bandage) Every Other Day/30 Days Discharge Instructions: Secure with ACE bandage as directed. Com pression Wrap: Kerlix Roll 4.5x3.1 (in/yd) Every Other Day/30 Days Discharge Instructions: Apply Kerlix and Coban compression as directed. 06/19/2022: The TheraSkin continues to look good. The edges are a bit dry, but the central portion of each of her  wounds is showing a nice pink color. I think as long as the TheraSkin continues to look good and I am seeing ongoing improvement underneath the grafts, I am going to plan to leave them in situ. We will continue to apply hydrogel to the TheraSkin. I asked the patient to focus on making sure that the edges were well moisturized, as I am seeing some dryness in this area. Continue Adaptic, Kerlix, and Ace bandaging. Follow-up in 1 week. Electronic Signature(s) Signed: 06/19/2022 9:12:56 AM By: Fredirick Maudlin MD FACS Entered By: Fredirick Maudlin on 06/19/2022 09:12:56 -------------------------------------------------------------------------------- HxROS Details Patient Name: Date of Service: [name], [name] 06/19/2022 8:45 A M Medical Record Number: [id number] Patient Account Number: [id number] Date of Birth/Sex: Treating RN: 1970/08/23 (52 y.o. F) Primary Care Provider: [name] Other Clinician: Referring Provider: Treating Provider/Extender: Elyse Jarvis Weeks in Treatment: 30 Information Obtained From Patient Constitutional Symptoms (General Health) Medical History: Past Medical History  Notes: H/O miscarriage Eyes Medical History: Negative for: Cataracts; Glaucoma; Optic Neuritis Ear/Nose/Mouth/Throat Medical History: Negative for: Chronic sinus problems/congestion; Middle ear problems Hematologic/Lymphatic Medical History: Positive for: Anemia; Sickle Cell Disease Negative for: Hemophilia; Human Immunodeficiency Virus; Lymphedema Respiratory Medical History: Negative for: Aspiration; Asthma; Chronic Obstructive Pulmonary Disease (COPD); Pneumothorax; Sleep Apnea; Tuberculosis Cardiovascular Medical History: Negative for: Angina; Arrhythmia; Congestive Heart Failure; Coronary Artery Disease; Deep Vein Thrombosis; Hypertension; Hypotension; Myocardial Infarction; Peripheral Arterial Disease; Peripheral Venous Disease; Phlebitis; Vasculitis Past Medical History Notes: bradycardia [name], [name] ([id number]) [id number] Page 13 of 14 Gastrointestinal Medical History: Negative for: Cirrhosis ; Colitis; Crohns; Hepatitis A; Hepatitis B; Hepatitis C Past Medical History Notes: cholilithiasis Endocrine Medical History: Negative for: Type I Diabetes; Type II Diabetes Genitourinary Medical History: Negative for: End Stage Renal Disease Immunological Medical History: Negative for: Lupus Erythematosus; Raynauds; Scleroderma Integumentary (Skin) Medical History: Negative for: History of Burn Musculoskeletal Medical History: Negative for: Gout; Rheumatoid Arthritis; Osteoarthritis; Osteomyelitis Neurologic Medical History: Positive for: Neuropathy - right foot intermittant Negative for: Dementia; Quadriplegia; Paraplegia; Seizure Disorder Oncologic Medical History: Negative for: Received Chemotherapy; Received Radiation Psychiatric Medical History: Negative for: Anorexia/bulimia; Confinement Anxiety Immunizations Pneumococcal Vaccine: Received Pneumococcal Vaccination: No Implantable Devices None Hospitalization / Surgery  History Type of Hospitalization/Surgery c section x2 left breast lumpectomy iandD right ankle with theraskin Family and Social History Cancer: No; Diabetes: Yes - Mother; Heart Disease: No; Hereditary Spherocytosis: No; Hypertension: No; Kidney Disease: No; Lung Disease: Yes - Mother; Seizures: No; Stroke: No; Thyroid Problems: No; Tuberculosis: No; Never smoker; Marital Status - Married; Alcohol Use: Never; Drug Use: No History; Caffeine Use: Daily; Financial Concerns: No; Food, Clothing or Shelter Needs: No; Support System Lacking: No; Transportation Concerns: No Electronic Signature(s) Signed: 06/19/2022 9:31:25 AM By: Fredirick Maudlin MD FACS Entered By: Fredirick Maudlin on 06/19/2022 09:11:14 [name] ([id number]) [id number] Page 14 of 14 -------------------------------------------------------------------------------- SuperBill Details Patient Name: Date of Service: [name] [name] 06/19/2022 Medical Record Number: [id number] Patient Account Number: [id number] Date of Birth/Sex: Treating RN: Feb 19, 1971 (52 y.o. 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

## 2022-06-19 NOTE — Progress Notes (Signed)
DIAMOND, JENTZ ([id number]) [id number] Page 1 of 10 Visit Report for 06/19/2022 Arrival Information Details Patient Name: Date of Service: [name] [name] 06/19/2022 8:45 A M Medical Record Number: [id number] Patient Account Number: [id number] Date of Birth/Sex: Treating RN: 03/18/1971 (52 y.o. [name], Weber Primary Care Ramah Langhans: [name] Other Clinician: Referring Kitt Minardi: Treating Jovonte Commins/Extender: Elyse Jarvis Weeks in Treatment: 34 Visit Information History Since Last Visit Added or deleted any medications: No Patient Arrived: [name] Any new allergies or adverse reactions: No Arrival Time: 08:49 Had a fall or experienced change in No Accompanied By: self activities of daily living that may affect Transfer Assistance: None risk of falls: Patient Identification Verified: Yes Signs or symptoms of abuse/neglect since last visito No Secondary Verification Process Completed: Yes Hospitalized since last visit: No Patient Requires Transmission-Based Precautions: No Implantable device outside of the clinic excluding No Patient Has Alerts: No cellular tissue based products placed in the center since last visit: Has Dressing in Place as Prescribed: Yes Pain Present Now: Yes Electronic Signature(s) Signed: 06/19/2022 4:56:24 PM By: Blanche East RN Entered By: Blanche East on 06/19/2022 09:12:09 -------------------------------------------------------------------------------- Clinic Level of Care Assessment Details Patient Name: Date of Service: [name] [name] 06/19/2022 8:45 A M Medical Record Number: [id number] Patient Account Number: [id number] Date of Birth/Sex: Treating RN: March 27, 1971 (52 y.o. [name] Primary Care Noelle Sease: [name] Other Clinician: Referring Danija Gosa: Treating Jaleel Allen/Extender: Elyse Jarvis Weeks in Treatment: 39 Clinic Level of Care Assessment Items TOOL 4  Quantity Score X- 1 0 Use when only an EandM is performed on FOLLOW-UP visit ASSESSMENTS - Nursing Assessment / Reassessment '[]'$  - 0 Reassessment of Co-morbidities (includes updates in patient status) '[]'$  - 0 Reassessment of Adherence to Treatment Plan ASSESSMENTS - Wound and Skin A ssessment / Reassessment X - Simple Wound Assessment / Reassessment - one wound 1 5 '[]'$  - 0 Complex Wound Assessment / Reassessment - multiple wounds '[]'$  - 0 Dermatologic / Skin Assessment (not related to wound area) ASSESSMENTS - Focused Assessment X- 1 5 Circumferential Edema Measurements - multi extremities '[]'$  - 0 Nutritional Assessment / Counseling / Intervention [name] ([id number]) [id number] Page 2 of 10 '[]'$  - 0 Lower Extremity Assessment (monofilament, tuning fork, pulses) '[]'$  - 0 Peripheral Arterial Disease Assessment (using hand held doppler) ASSESSMENTS - Ostomy and/or Continence Assessment and Care '[]'$  - 0 Incontinence Assessment and Management '[]'$  - 0 Ostomy Care Assessment and Management (repouching, etc.) PROCESS - Coordination of Care X - Simple Patient / Family Education for ongoing care 1 15 '[]'$  - 0 Complex (extensive) Patient / Family Education for ongoing care X- 1 10 Staff obtains Programmer, systems, Records, T Results / Process Orders est X- 1 10 Staff telephones HHA, Nursing Homes / Clarify orders / etc '[]'$  - 0 Routine Transfer to another Facility (non-emergent condition) '[]'$  - 0 Routine Hospital Admission (non-emergent condition) '[]'$  - 0 New Admissions / Biomedical engineer / Ordering NPWT Apligraf, etc. , '[]'$  - 0 Emergency Hospital Admission (emergent condition) '[]'$  - 0 Simple Discharge Coordination '[]'$  - 0 Complex (extensive) Discharge Coordination PROCESS - Special Needs '[]'$  - 0 Pediatric / Minor Patient Management '[]'$  - 0 Isolation Patient Management '[]'$  - 0 Hearing / Language / Visual special needs '[]'$  - 0 Assessment of Community assistance  (transportation, D/C planning, etc.) '[]'$  - 0 Additional assistance / Altered mentation '[]'$  - 0 Support Surface(s) Assessment (bed, cushion, seat, etc.) INTERVENTIONS - Wound Cleansing / Measurement X - Simple Wound  Cleansing - one wound 1 5 '[]'$  - 0 Complex Wound Cleansing - multiple wounds X- 1 5 Wound Imaging (photographs - any number of wounds) '[]'$  - 0 Wound Tracing (instead of photographs) X- 1 5 Simple Wound Measurement - one wound '[]'$  - 0 Complex Wound Measurement - multiple wounds INTERVENTIONS - Wound Dressings '[]'$  - 0 Small Wound Dressing one or multiple wounds X- 2 15 Medium Wound Dressing one or multiple wounds '[]'$  - 0 Large Wound Dressing one or multiple wounds '[]'$  - 0 Application of Medications - topical '[]'$  - 0 Application of Medications - injection INTERVENTIONS - Miscellaneous '[]'$  - 0 External ear exam '[]'$  - 0 Specimen Collection (cultures, biopsies, blood, body fluids, etc.) '[]'$  - 0 Specimen(s) / Culture(s) sent or taken to Lab for analysis '[]'$  - 0 Patient Transfer (multiple staff / Civil Service fast streamer / Similar devices) '[]'$  - 0 Simple Staple / Suture removal (25 or less) '[]'$  - 0 Complex Staple / Suture removal (26 or more) '[]'$  - 0 Hypo / Hyperglycemic Management (close monitor of Blood Glucose) [name] ([id number]) [id number] Page 3 of 10 '[]'$  - 0 Ankle / Brachial Index ([name]) - do not check if billed separately X- 1 5 Vital Signs Has the patient been seen at the hospital within the last three years: Yes Total Score: 95 Level Of Care: New/Established - Level 3 Electronic Signature(s) Signed: 06/19/2022 4:56:24 PM By: Blanche East RN Entered By: Blanche East on 06/19/2022 09:11:22 -------------------------------------------------------------------------------- Encounter Discharge Information Details Patient Name: Date of Service: [name], [name] 06/19/2022 8:45 A M Medical Record Number: [id number] Patient Account Number:  [id number] Date of Birth/Sex: Treating RN: Jan 10, 1971 (52 y.o. [name] Primary Care Craven Crean: [name] Other Clinician: Referring Myalynn Lingle: Treating Deepika Decatur/Extender: Elyse Jarvis Weeks in Treatment: 38 Encounter Discharge Information Items Discharge Condition: Stable Ambulatory Status: Cane Discharge Destination: Home Transportation: Private Auto Accompanied By: self Schedule Follow-up Appointment: Yes Clinical Summary of Care: Electronic Signature(s) Signed: 06/19/2022 4:56:24 PM By: Blanche East RN Entered By: Blanche East on 06/19/2022 09:12:01 -------------------------------------------------------------------------------- Lower Extremity Assessment Details Patient Name: Date of Service: [name], Divernon [name] 06/19/2022 8:45 A M Medical Record Number: [id number] Patient Account Number: [id number] Date of Birth/Sex: Treating RN: 09-05-1970 (52 y.o. [name] Primary Care Aidah Forquer: [name] Other Clinician: Referring Keisi Eckford: Treating Soua Lenk/Extender: Elyse Jarvis Weeks in Treatment: 61 Edema Assessment Assessed: [Left: No] [Right: No] Edema: [Left: Ye] [Right: s] Calf Left: Right: Point of Measurement: 33 cm From Medial Instep 31.4 cm Ankle Left: Right: Point of Measurement: 10 cm From Medial Instep 19.5 cm Vascular Assessment Authement, [name] ([id number]) [Right:[id number] Page 4 of 10] Pulses: Dorsalis Pedis Palpable: [Right:Yes] Electronic Signature(s) Signed: 06/19/2022 4:56:24 PM By: Blanche East RN Entered By: Blanche East on 06/19/2022 08:54:39 -------------------------------------------------------------------------------- Multi Wound Chart Details Patient Name: Date of Service: [name], [name] 06/19/2022 8:45 A M Medical Record Number: [id number] Patient Account Number: [id number] Date of Birth/Sex: Treating RN: 23-May-1970 (52 y.o. F) Primary Care [name] Mangel:  [name] Other Clinician: Referring Mishel Sans: Treating Terrius Gentile/Extender: Elyse Jarvis Weeks in Treatment: 92 Vital Signs Height(in): 28 Pulse(bpm): 58 Weight(lbs): Blood Pressure(mmHg): 122/65 Body Mass Index(BMI): Temperature(F): 98.5 Respiratory Rate(breaths/min): 16 [17:Photos:] [N/A:N/A] Right, Lateral Lower Leg Right, Medial Ankle N/A Wound Location: Gradually Appeared Gradually Appeared N/A Wounding Event: Sickle Cell Lesion Sickle Cell Lesion N/A Primary Etiology: N/A Venous Leg Ulcer N/A Secondary Etiology: Anemia, Sickle Cell Disease, Anemia, Sickle Cell Disease, N/A Comorbid History:  Neuropathy Neuropathy 10/05/2012 06/26/2021 N/A Date Acquired: 35 51 N/A Weeks of Treatment: Open Open N/A Wound Status: No No N/A Wound Recurrence: Yes No N/A Clustered Wound: 6.4x5x0.1 7.3x3.4x0.1 N/A Measurements L x W x D (cm) 25.133 19.494 N/A A (cm) : rea 2.513 1.949 N/A Volume (cm) : 86.60% 32.70% N/A % Reduction in Area: 86.60% 32.70% N/A % Reduction in Volume: Full Thickness Without Exposed Full Thickness Without Exposed N/A Classification: Support Structures Support Structures Medium Medium N/A Exudate A mount: Serosanguineous Serosanguineous N/A Exudate Type: red, brown red, brown N/A Exudate Color: Distinct, outline attached Distinct, outline attached N/A Wound Margin: Medium (34-66%) Medium (34-66%) N/A Granulation Amount: N/A Pink N/A Granulation Quality: Medium (34-66%) Medium (34-66%) N/A Necrotic Amount: N/A Eschar, Adherent Slough N/A Necrotic Tissue: Fat Layer (Subcutaneous Tissue): Yes Fat Layer (Subcutaneous Tissue): Yes N/A Exposed Structures: Fascia: No Fascia: No Tendon: No Tendon: No Muscle: No Muscle: No Joint: No Joint: No Bone: No Bone: No Small (1-33%) Small (1-33%) N/A Epithelialization: Scarring: Yes Scarring: Yes N/A Periwound Skin Texture: No Abnormalities Noted No Abnormalities Noted  N/A [address]NEYRA, PETTIE ([id number]) [id number] Page 5 of 10 No Abnormalities Noted No Abnormalities Noted N/A Periwound Skin Color: No Abnormality No Abnormality N/A Temperature: Treatment Notes Electronic Signature(s) Signed: 06/19/2022 9:10:05 AM By: Fredirick Maudlin MD FACS Entered By: Fredirick Maudlin on 06/19/2022 09:10:05 -------------------------------------------------------------------------------- Multi-Disciplinary Care Plan Details Patient Name: Date of Service: [name], [name] 06/19/2022 8:45 A M Medical Record Number: [id number] Patient Account Number: [id number] Date of Birth/Sex: Treating RN: 1970-11-08 (52 y.o. [name] Primary Care Avah Bashor: [name] Other Clinician: Referring Keela Rubert: Treating Earlean Fidalgo/Extender: Elyse Jarvis Weeks in Treatment: 64 Multidisciplinary Care Plan reviewed with physician Active Inactive Venous Leg Ulcer Nursing Diagnoses: Actual venous Insuffiency (use after diagnosis is confirmed) Knowledge deficit related to disease process and management Goals: Patient will maintain optimal edema control Date Initiated: 06/26/2021 Target Resolution Date: 08/15/2022 Goal Status: Active Interventions: Assess peripheral edema status every visit. Compression as ordered Treatment Activities: Therapeutic compression applied : 06/26/2021 Notes: Wound/Skin Impairment Nursing Diagnoses: Impaired tissue integrity Goals: Patient/caregiver will verbalize understanding of skin care regimen Date Initiated: 04/17/2021 Target Resolution Date: 08/15/2022 Goal Status: Active Ulcer/skin breakdown will have a volume reduction of 30% by week 4 Date Initiated: 04/17/2021 Date Inactivated: 05/29/2021 Target Resolution Date: 05/15/2021 Goal Status: Met Ulcer/skin breakdown will have a volume reduction of 50% by week 8 Date Initiated: 05/29/2021 Date Inactivated: 06/26/2021 Target  Resolution Date: 06/26/2021 Goal Status: Unmet Unmet Reason: venous reflux Interventions: Assess patient/caregiver ability to obtain necessary supplies Assess patient/caregiver ability to perform ulcer/skin care regimen upon admission and as needed Assess ulceration(s) every visit Provide education on ulcer and skin care Treatment Activities: Topical wound management initiated : 04/17/2021 [name] ([id number]) [id number] Page 6 of 10 Notes: 06/08/21: Left leg wounds greater than 30% volume reduction, right leg acute infection. Electronic Signature(s) Signed: 06/19/2022 4:56:24 PM By: Blanche East RN Entered By: Blanche East on 06/19/2022 09:02:51 -------------------------------------------------------------------------------- Pain Assessment Details Patient Name: Date of Service: [name],  [name] 06/19/2022 8:45 A M Medical Record Number: [id number] Patient Account Number: [id number] Date of Birth/Sex: Treating RN: [birth year] (52 y.o. [name] Primary Care Evarose Altland: [name] Other Clinician: Referring Emmanuelle Hibbitts: Treating Kristion Holifield/Extender: Elyse Jarvis Weeks in Treatment: 28 Active Problems Location of Pain Severity and Description of Pain Patient Has Paino Yes Site Locations Rate the pain. Current Pain Level: 3 Character of Pain Describe the Pain:  Burning Pain Management and Medication Current Pain Management: Electronic Signature(s) Signed: 06/19/2022 4:56:24 PM By: Blanche East RN Entered By: Blanche East on 06/19/2022 08:52:18 -------------------------------------------------------------------------------- Patient/Caregiver Education Details Patient Name: Date of Service: [name], [name] 1/31/2024andnbsp8:45 [place] Record Number: [id number] Patient Account Number: [id number] Date of Birth/Gender: Treating RN: 06/15/1970 (52 y.o. [name] Primary Care Physician: [name] Other  Clinician: Referring Physician: Treating Physician/Extender: Elyse Jarvis Hobgood, [name] ([id number]) [id number] Page 7 of 10 Weeks in Treatment: 78 Education Assessment Education Provided To: Patient Education Topics Provided Pain: Methods: Explain/Verbal Responses: Reinforcements needed Wound/Skin Impairment: Methods: Explain/Verbal Responses: Reinforcements needed, State content correctly Electronic Signature(s) Signed: 06/19/2022 4:56:24 PM By: Blanche East RN Entered By: Blanche East on 06/19/2022 09:10:21 -------------------------------------------------------------------------------- Wound Assessment Details Patient Name: Date of Service: [name], [name] 06/19/2022 8:45 A M Medical Record Number: [id number] Patient Account Number: [id number] Date of Birth/Sex: Treating RN: 1971/04/10 (52 y.o. [name], [name] Primary Care Shahana Capes: [name] Other Clinician: Referring Jaquis Picklesimer: Treating Beckham Capistran/Extender: Elyse Jarvis Weeks in Treatment: 54 Wound Status Wound Number: 17 Primary Etiology: Sickle Cell Lesion Wound Location: Right, Lateral Lower Leg Wound Status: Open Wounding Event: Gradually Appeared Comorbid History: Anemia, Sickle Cell Disease, Neuropathy Date Acquired: 10/05/2012 Weeks Of Treatment: 61 Clustered Wound: Yes Photos Wound Measurements Length: (cm) 6.4 Width: (cm) 5 Depth: (cm) 0.1 Area: (cm) 25.133 Volume: (cm) 2.513 % Reduction in Area: 86.6% % Reduction in Volume: 86.6% Epithelialization: Small (1-33%) Tunneling: No Undermining: No Wound Description Classification: Full Thickness Without Exposed Support Wound Margin: Distinct, outline attached Exudate Amount: Medium Exudate Type: Serosanguineous Zanetti, [name] ([id number]) Exudate Color: red, brown Structures Foul Odor After Cleansing: No Slough/Fibrino Yes [id number] Page 8 of  10 Wound Bed Granulation Amount: Medium (34-66%) Exposed Structure Necrotic Amount: Medium (34-66%) Fascia Exposed: No Fat Layer (Subcutaneous Tissue) Exposed: Yes Tendon Exposed: No Muscle Exposed: No Joint Exposed: No Bone Exposed: No Periwound Skin Texture Texture Color No Abnormalities Noted: No No Abnormalities Noted: Yes Scarring: Yes Temperature / Pain Temperature: No Abnormality Moisture No Abnormalities Noted: Yes Treatment Notes Wound #17 (Lower Leg) Wound Laterality: Right, Lateral Cleanser Soap and Water Discharge Instruction: May shower and wash wound with dial antibacterial soap and water prior to dressing change. Wound Cleanser Discharge Instruction: Cleanse the wound with wound cleanser prior to applying a clean dressing using gauze sponges, not tissue or cotton balls. Peri-Wound Care Triamcinolone 15 (g) Discharge Instruction: Use triamcinolone 15 (g) as directed Sween Lotion (Moisturizing lotion) Discharge Instruction: Apply moisturizing lotion as directed Topical Skintegrity Hydrogel 4 (oz) Discharge Instruction: Apply hydrogel as directed Primary Dressing ADAPTIC TOUCH 3x4.25 (in/in) Discharge Instruction: Apply to wound bed as instructed Secondary Dressing ABD Pad, 5x9 Discharge Instruction: Apply over primary dressing as directed. Secured With Elastic Bandage 4 inch (ACE bandage) Discharge Instruction: Secure with ACE bandage as directed. Compression Wrap Kerlix Roll 4.5x3.1 (in/yd) Discharge Instruction: Apply Kerlix and Coban compression as directed. Compression Stockings Add-Ons Electronic Signature(s) Signed: 06/19/2022 4:56:24 PM By: Blanche East RN Entered By: Blanche East on 06/19/2022 08:59:05 -------------------------------------------------------------------------------- Wound Assessment Details Patient Name: Date of Service: [name], [name] 06/19/2022 8:45 A Gardiner Sleeper ([id number]) [id number]  Page 9 of 10 Medical Record Number: [id number] Patient Account Number: [id number] Date of Birth/Sex: Treating RN: [birth year] (52 y.o. [name] Primary Care Shenia Alan: [name] Other Clinician: Referring Lirio Bach: Treating [name]/Extender: Elyse Jarvis Weeks in Treatment: 77 Wound Status Wound Number: 21 Primary  Etiology: Sickle Cell Lesion Wound Location: Right, Medial Ankle Secondary Etiology: Venous Leg Ulcer Wounding Event: Gradually Appeared Wound Status: Open Date Acquired: 06/26/2021 Comorbid History: Anemia, Sickle Cell Disease, Neuropathy Weeks Of Treatment: 51 Clustered Wound: No Photos Wound Measurements Length: (cm) 7.3 Width: (cm) 3.4 Depth: (cm) 0.1 Area: (cm) 19.494 Volume: (cm) 1.949 % Reduction in Area: 32.7% % Reduction in Volume: 32.7% Epithelialization: Small (1-33%) Tunneling: No Undermining: No Wound Description Classification: Full Thickness Without Exposed Support Structures Wound Margin: Distinct, outline attached Exudate Amount: Medium Exudate Type: Serosanguineous Exudate Color: red, brown Foul Odor After Cleansing: No Slough/Fibrino Yes Wound Bed Granulation Amount: Medium (34-66%) Exposed Structure Granulation Quality: Pink Fascia Exposed: No Necrotic Amount: Medium (34-66%) Fat Layer (Subcutaneous Tissue) Exposed: Yes Necrotic Quality: Eschar, Adherent Slough Tendon Exposed: No Muscle Exposed: No Joint Exposed: No Bone Exposed: No Periwound Skin Texture Texture Color No Abnormalities Noted: No No Abnormalities Noted: Yes Scarring: Yes Temperature / Pain Temperature: No Abnormality Moisture No Abnormalities Noted: Yes Treatment Notes Wound #21 (Ankle) Wound Laterality: Right, Medial Cleanser Soap and Water Discharge Instruction: May shower and wash wound with dial antibacterial soap and water prior to dressing change. Wound Cleanser Discharge Instruction: Cleanse the wound with wound  cleanser prior to applying a clean dressing using gauze sponges, not tissue or cotton balls. Peri-Wound Care Triamcinolone 15 (g) Discharge Instruction: Use triamcinolone 15 (g) as directed Sween Lotion (Moisturizing lotion) [name] ([id number]) [id number] Page 10 of 10 Discharge Instruction: Apply moisturizing lotion as directed Topical Primary Dressing ADAPTIC TOUCH 3x4.25 (in/in) Discharge Instruction: Apply to wound bed as instructed Secondary Dressing ABD Pad, 5x9 Discharge Instruction: Apply over primary dressing as directed. Secured With Elastic Bandage 4 inch (ACE bandage) Discharge Instruction: Secure with ACE bandage as directed. Compression Wrap Kerlix Roll 4.5x3.1 (in/yd) Discharge Instruction: Apply Kerlix and Coban compression as directed. Compression Stockings Add-Ons Electronic Signature(s) Signed: 06/19/2022 4:56:24 PM By: Blanche East RN Entered By: Blanche East on 06/19/2022 08:59:42 -------------------------------------------------------------------------------- Vitals Details Patient Name: Date of Service: [name], [name] 06/19/2022 8:45 A M Medical Record Number: [id number] Patient Account Number: [id number] Date of Birth/Sex: Treating RN: 1971/01/31 (52 y.o. [name], [name] Primary Care Ceasia Elwell: [name] Other Clinician: Referring Willy Pinkerton: Treating Driana Dazey/Extender: Elyse Jarvis Weeks in Treatment: 45 Vital Signs Time Taken: 08:50 Temperature (F): 98.5 Height (in): 67 Pulse (bpm): 83 Respiratory Rate (breaths/min): 16 Blood Pressure (mmHg): 122/65 Reference Range: 80 - 120 mg / dl Electronic Signature(s) Signed: 06/19/2022 4:56:24 PM By: Blanche East RN Entered By: Blanche East on 06/19/2022 08:52:07

## 2022-06-20 LAB — CMP14+CBC/D/PLT+FER+RETIC+V...
ALT: 15 IU/L (ref 0–32)
AST: 33 IU/L (ref 0–40)
Albumin/Globulin Ratio: 1.6 (ref 1.2–2.2)
Albumin: 4.4 g/dL (ref 3.9–4.9)
Alkaline Phosphatase: 64 IU/L (ref 44–121)
BUN/Creatinine Ratio: 16 (ref 9–23)
BUN: 8 mg/dL (ref 6–24)
Basophils Absolute: 0.1 10*3/uL (ref 0.0–0.2)
Basos: 1 %
Bilirubin Total: 2.8 mg/dL — ABNORMAL HIGH (ref 0.0–1.2)
CO2: 18 mmol/L — ABNORMAL LOW (ref 20–29)
Calcium: 9.4 mg/dL (ref 8.7–10.2)
Chloride: 106 mmol/L (ref 96–106)
Creatinine, Ser: 0.51 mg/dL — ABNORMAL LOW (ref 0.57–1.00)
EOS (ABSOLUTE): 0.6 10*3/uL — ABNORMAL HIGH (ref 0.0–0.4)
Eos: 4 %
Ferritin: 1756 ng/mL — ABNORMAL HIGH (ref 15–150)
Globulin, Total: 2.8 g/dL (ref 1.5–4.5)
Glucose: 79 mg/dL (ref 70–99)
Hematocrit: 19.4 % — ABNORMAL LOW (ref 34.0–46.6)
Hemoglobin: 6.6 g/dL — CL (ref 11.1–15.9)
Immature Grans (Abs): 0.1 10*3/uL (ref 0.0–0.1)
Immature Granulocytes: 1 %
Lymphocytes Absolute: 2.6 10*3/uL (ref 0.7–3.1)
Lymphs: 19 %
MCH: 27.7 pg (ref 26.6–33.0)
MCHC: 34 g/dL (ref 31.5–35.7)
MCV: 82 fL (ref 79–97)
Monocytes Absolute: 1.6 10*3/uL — ABNORMAL HIGH (ref 0.1–0.9)
Monocytes: 11 %
NRBC: 1 % — ABNORMAL HIGH (ref 0–0)
Neutrophils Absolute: 8.9 10*3/uL — ABNORMAL HIGH (ref 1.4–7.0)
Neutrophils: 64 %
Platelets: 623 10*3/uL — ABNORMAL HIGH (ref 150–450)
Potassium: 4.6 mmol/L (ref 3.5–5.2)
RBC: 2.38 x10E6/uL — CL (ref 3.77–5.28)
RDW: 21.7 % — ABNORMAL HIGH (ref 11.7–15.4)
Retic Ct Pct: 12 % — ABNORMAL HIGH (ref 0.6–2.6)
Sodium: 139 mmol/L (ref 134–144)
Total Protein: 7.2 g/dL (ref 6.0–8.5)
Vit D, 25-Hydroxy: 20.1 ng/mL — ABNORMAL LOW (ref 30.0–100.0)
WBC: 13.9 10*3/uL — ABNORMAL HIGH (ref 3.4–10.8)
eGFR: 114 mL/min/{1.73_m2} (ref >59)

## 2022-06-20 LAB — DRUG SCREEN 764883 11+OXYCO+ALC+CRT-BUND

## 2022-06-23 LAB — DRUG SCREEN 764883 11+OXYCO+ALC+CRT-BUND
Amphetamines, Urine: NEGATIVE ng/mL
BENZODIAZ UR QL: NEGATIVE ng/mL
Barbiturate: NEGATIVE ng/mL
Cannabinoid Quant, Ur: NEGATIVE ng/mL
Cocaine (Metabolite): NEGATIVE ng/mL
Creatinine: 41.5 mg/dL (ref 20.0–300.0)
Ethanol: NEGATIVE %
Meperidine: NEGATIVE ng/mL
Methadone Screen, Urine: NEGATIVE ng/mL
OPIATE SCREEN URINE: NEGATIVE ng/mL
Phencyclidine: NEGATIVE ng/mL
Propoxyphene: NEGATIVE ng/mL
Tramadol: NEGATIVE ng/mL
pH, Urine: 5.9 (ref 4.5–8.9)

## 2022-06-23 LAB — OXYCODONE/OXYMORPHONE, CONFIRM
OXYCODONE/OXYMORPH: POSITIVE — AB
OXYCODONE: 563 ng/mL
OXYCODONE: POSITIVE — AB
OXYMORPHONE (GC/MS): 874 ng/mL
OXYMORPHONE: POSITIVE — AB

## 2022-06-27 ENCOUNTER — Encounter (HOSPITAL_BASED_OUTPATIENT_CLINIC_OR_DEPARTMENT_OTHER): Payer: Medicaid Other | Attending: General Surgery | Admitting: General Surgery

## 2022-06-27 DIAGNOSIS — D571 Sickle-cell disease without crisis: Secondary | ICD-10-CM | POA: Diagnosis not present

## 2022-06-27 DIAGNOSIS — G629 Polyneuropathy, unspecified: Secondary | ICD-10-CM | POA: Insufficient documentation

## 2022-06-27 DIAGNOSIS — L97818 Non-pressure chronic ulcer of other part of right lower leg with other specified severity: Secondary | ICD-10-CM | POA: Diagnosis present

## 2022-06-27 NOTE — Progress Notes (Signed)
INTISAR, CLAUDIO ([id number]) [id number] Page 1 of 10 Visit Report for 06/27/2022 Arrival Information Details Patient Name: Date of Service: [name] [name] 06/27/2022 10:15 A M Medical Record Number: [id number] Patient Account Number: [id number] Date of Birth/Sex: Treating RN: [birth year] (52 y.o. F) Primary Care Jonette Wassel: [name] Other Clinician: Referring Cyree Chuong: Treating Octavious Zidek/Extender: [name] Weeks in Treatment: 45 Visit Information History Since Last Visit All ordered tests and consults were completed: No Patient Arrived: [name] Added or deleted any medications: No Arrival Time: 09:53 Any new allergies or adverse reactions: No Accompanied By: self Had a fall or experienced change in No Transfer Assistance: None activities of daily living that may affect Patient Identification Verified: Yes risk of falls: Secondary Verification Process Completed: Yes Signs or symptoms of abuse/neglect since last visito No Patient Requires Transmission-Based Precautions: No Hospitalized since last visit: No Patient Has Alerts: No Implantable device outside of the clinic excluding No cellular tissue based products placed in the center since last visit: Pain Present Now: Yes Electronic Signature(s) Signed: 06/27/2022 11:27:16 AM By: [name] Entered By: [name] on 06/27/2022 09:53:47 -------------------------------------------------------------------------------- Clinic Level of Care Assessment Details Patient Name: Date of Service: [name] [name] 06/27/2022 10:15 A M Medical Record Number: [id number] Patient Account Number: [id number] Date of Birth/Sex: Treating RN: 10-02-1970 (52 y.o. [name] Primary Care Ermal Haberer: [name] Other Clinician: Referring Anabela Crayton: Treating Karessa Onorato/Extender: [name] Weeks in Treatment: 58 Clinic Level of Care Assessment Items TOOL 4 Quantity  Score X- 1 0 Use when only an EandM is performed on FOLLOW-UP visit ASSESSMENTS - Nursing Assessment / Reassessment X- 1 10 Reassessment of Co-morbidities (includes updates in patient status) X- 1 5 Reassessment of Adherence to Treatment Plan ASSESSMENTS - Wound and Skin A ssessment / Reassessment X - Simple Wound Assessment / Reassessment - one wound 1 5 '[]'$  - 0 Complex Wound Assessment / Reassessment - multiple wounds '[]'$  - 0 Dermatologic / Skin Assessment (not related to wound area) ASSESSMENTS - Focused Assessment X- 1 5 Circumferential Edema Measurements - multi extremities '[]'$  - 0 Nutritional Assessment / Counseling / Intervention [name] ([id number]) [id number] Page 2 of 10 X- 1 5 Lower Extremity Assessment (monofilament, tuning fork, pulses) '[]'$  - 0 Peripheral Arterial Disease Assessment (using hand held doppler) ASSESSMENTS - Ostomy and/or Continence Assessment and Care '[]'$  - 0 Incontinence Assessment and Management '[]'$  - 0 Ostomy Care Assessment and Management (repouching, etc.) PROCESS - Coordination of Care X - Simple Patient / Family Education for ongoing care 1 15 '[]'$  - 0 Complex (extensive) Patient / Family Education for ongoing care X- 1 10 Staff obtains Programmer, systems, Records, T Results / Process Orders est '[]'$  - 0 Staff telephones HHA, Nursing Homes / Clarify orders / etc '[]'$  - 0 Routine Transfer to another Facility (non-emergent condition) '[]'$  - 0 Routine Hospital Admission (non-emergent condition) '[]'$  - 0 New Admissions / Biomedical engineer / Ordering NPWT Apligraf, etc. , '[]'$  - 0 Emergency Hospital Admission (emergent condition) X- 1 10 Simple Discharge Coordination '[]'$  - 0 Complex (extensive) Discharge Coordination PROCESS - Special Needs '[]'$  - 0 Pediatric / Minor Patient Management '[]'$  - 0 Isolation Patient Management '[]'$  - 0 Hearing / Language / Visual special needs '[]'$  - 0 Assessment of Community assistance  (transportation, D/C planning, etc.) '[]'$  - 0 Additional assistance / Altered mentation '[]'$  - 0 Support Surface(s) Assessment (bed, cushion, seat, etc.) INTERVENTIONS - Wound Cleansing / Measurement X - Simple Wound Cleansing -  one wound 1 5 '[]'$  - 0 Complex Wound Cleansing - multiple wounds X- 1 5 Wound Imaging (photographs - any number of wounds) '[]'$  - 0 Wound Tracing (instead of photographs) '[]'$  - 0 Simple Wound Measurement - one wound X- 2 5 Complex Wound Measurement - multiple wounds INTERVENTIONS - Wound Dressings '[]'$  - 0 Small Wound Dressing one or multiple wounds X- 1 15 Medium Wound Dressing one or multiple wounds '[]'$  - 0 Large Wound Dressing one or multiple wounds '[]'$  - 0 Application of Medications - topical '[]'$  - 0 Application of Medications - injection INTERVENTIONS - Miscellaneous '[]'$  - 0 External ear exam '[]'$  - 0 Specimen Collection (cultures, biopsies, blood, body fluids, etc.) '[]'$  - 0 Specimen(s) / Culture(s) sent or taken to Lab for analysis '[]'$  - 0 Patient Transfer (multiple staff / Civil Service fast streamer / Similar devices) '[]'$  - 0 Simple Staple / Suture removal (25 or less) '[]'$  - 0 Complex Staple / Suture removal (26 or more) '[]'$  - 0 Hypo / Hyperglycemic Management (close monitor of Blood Glucose) [name], [name] ([id number]) [id number] Page 3 of 10 '[]'$  - 0 Ankle / Brachial Index ([name]) - do not check if billed separately X- 1 5 Vital Signs Has the patient been seen at the hospital within the last three years: Yes Total Score: 105 Level Of Care: New/Established - Level 3 Electronic Signature(s) Signed: 06/27/2022 3:35:42 PM By: [name] Entered By: [name] on 06/27/2022 10:20:51 -------------------------------------------------------------------------------- Encounter Discharge Information Details Patient Name: Date of Service: [name], [name] 06/27/2022 10:15 A M Medical Record Number: [id number] Patient Account Number:  [id number] Date of Birth/Sex: Treating RN: 03/31/1971 (52 y.o. [name] Primary Care Latona Krichbaum: [name] Other Clinician: Referring Brooks Kinnan: Treating Montrel Donahoe/Extender: [name] Weeks in Treatment: 28 Encounter Discharge Information Items Discharge Condition: Stable Ambulatory Status: Cane Discharge Destination: Home Transportation: Private Auto Accompanied By: self Schedule Follow-up Appointment: Yes Clinical Summary of Care: Patient Declined Electronic Signature(s) Signed: 06/27/2022 3:35:42 PM By: [name] Entered By: [name] on 06/27/2022 10:21:21 -------------------------------------------------------------------------------- Lower Extremity Assessment Details Patient Name: Date of Service: [name] URO [name], [name] 06/27/2022 10:15 A M Medical Record Number: [id number] Patient Account Number: [id number] Date of Birth/Sex: Treating RN: 08-Feb-1971 (52 y.o. [name] Primary Care Vin Yonke: [name] Other Clinician: Referring Jazz Rogala: Treating Catherine Cubero/Extender: [name] Weeks in Treatment: 62 Edema Assessment Assessed: [Left: No] [Right: No] Edema: [Left: Ye] [Right: s] Calf Left: Right: Point of Measurement: 33 cm From Medial Instep 31.5 cm Ankle Left: Right: Point of Measurement: 10 cm From Medial Instep 19.5 cm Vascular Assessment Nola, Dalyn ([id number]) [Right:[id number] Page 4 of 10] Pulses: Dorsalis Pedis Palpable: [Right:Yes] Electronic Signature(s) Signed: 06/27/2022 3:35:42 PM By: [name] Entered By: [name] on 06/27/2022 10:07:29 -------------------------------------------------------------------------------- Multi Wound Chart Details Patient Name: Date of Service: [name], [name] 06/27/2022 10:15 A M Medical Record Number: [id number] Patient Account Number: [id number] Date of Birth/Sex: Treating  RN: [birth year] (52 y.o. F) Primary Care Krisa Blattner: [name] Other Clinician: Referring Rema Lievanos: Treating Aerika Groll/Extender: [name] Weeks in Treatment: 56 Vital Signs Height(in): 67 Pulse(bpm): 75 Weight(lbs): 134 Blood Pressure(mmHg): 133/73 Body Mass Index(BMI): 21 Temperature(F): 98.2 Respiratory Rate(breaths/min): 20 [17:Photos:] [N/A:N/A] Right, Lateral Lower Leg Right, Medial Ankle N/A Wound Location: Gradually Appeared Gradually Appeared N/A Wounding Event: Sickle Cell Lesion Sickle Cell Lesion N/A Primary Etiology: N/A Venous Leg Ulcer N/A Secondary Etiology: Anemia, Sickle Cell Disease, Anemia, Sickle Cell Disease, N/A Comorbid History: Neuropathy  Neuropathy 10/05/2012 06/26/2021 N/A Date Acquired: 52 52 N/A Weeks of Treatment: Open Open N/A Wound Status: No No N/A Wound Recurrence: Yes No N/A Clustered Wound: 6x5.2x0.1 7.5x3.2x0.1 N/A Measurements L x W x D (cm) 24.504 18.85 N/A A (cm) : rea 2.45 1.885 N/A Volume (cm) : 87.00% 35.00% N/A % Reduction in Area: 87.00% 35.00% N/A % Reduction in Volume: Full Thickness Without Exposed Full Thickness Without Exposed N/A Classification: Support Structures Support Structures Medium Medium N/A Exudate A mount: Serosanguineous Serosanguineous N/A Exudate Type: red, brown red, brown N/A Exudate Color: Distinct, outline attached Distinct, outline attached N/A Wound Margin: Medium (34-66%) Medium (34-66%) N/A Granulation Amount: N/A Pink N/A Granulation Quality: Medium (34-66%) Medium (34-66%) N/A Necrotic Amount: N/A Eschar, Adherent Slough N/A Necrotic Tissue: Fat Layer (Subcutaneous Tissue): Yes Fat Layer (Subcutaneous Tissue): Yes N/A Exposed Structures: Fascia: No Fascia: No Tendon: No Tendon: No Muscle: No Muscle: No Joint: No Joint: No Bone: No Bone: No Small (1-33%) Small (1-33%) N/A Epithelialization: Scarring: Yes Scarring: Yes N/A Periwound Skin  Texture: No Abnormalities Noted No Abnormalities Noted N/A Periwound Skin MoistureKELIYAH, [name] ([id number]) [id number] Page 5 of 10 No Abnormalities Noted No Abnormalities Noted N/A Periwound Skin Color: No Abnormality No Abnormality N/A Temperature: Treatment Notes Electronic Signature(s) Signed: 06/27/2022 10:17:01 AM By: [name] Maudlin MD FACS Entered By: [name] on 06/27/2022 10:17:01 -------------------------------------------------------------------------------- Multi-Disciplinary Care Plan Details Patient Name: Date of Service: [name] URO [name], [name] 06/27/2022 10:15 A M Medical Record Number: [id number] Patient Account Number: [id number] Date of Birth/Sex: Treating RN: 02-12-1971 (52 y.o. [name] Primary Care Kayann Maj: [name] Other Clinician: Referring Dalanie Kisner: Treating Arwyn Besaw/Extender: [name] Weeks in Treatment: 62 Multidisciplinary Care Plan reviewed with physician Active Inactive Venous Leg Ulcer Nursing Diagnoses: Actual venous Insuffiency (use after diagnosis is confirmed) Knowledge deficit related to disease process and management Goals: Patient will maintain optimal edema control Date Initiated: 06/26/2021 Target Resolution Date: 08/15/2022 Goal Status: Active Interventions: Assess peripheral edema status every visit. Compression as ordered Treatment Activities: Therapeutic compression applied : 06/26/2021 Notes: Wound/Skin Impairment Nursing Diagnoses: Impaired tissue integrity Goals: Patient/caregiver will verbalize understanding of skin care regimen Date Initiated: 04/17/2021 Target Resolution Date: 08/15/2022 Goal Status: Active Ulcer/skin breakdown will have a volume reduction of 30% by week 4 Date Initiated: 04/17/2021 Date Inactivated: 05/29/2021 Target Resolution Date: 05/15/2021 Goal Status: Met Ulcer/skin breakdown will have a volume reduction of 50% by week  8 Date Initiated: 05/29/2021 Date Inactivated: 06/26/2021 Target Resolution Date: 06/26/2021 Goal Status: Unmet Unmet Reason: venous reflux Interventions: Assess patient/caregiver ability to obtain necessary supplies Assess patient/caregiver ability to perform ulcer/skin care regimen upon admission and as needed Assess ulceration(s) every visit Provide education on ulcer and skin care Treatment Activities: Topical wound management initiated : 04/17/2021 [name] ([id number]) [phone].pdf Page 6 of 10 Notes: 06/08/21: Left leg wounds greater than 30% volume reduction, right leg acute infection. Electronic Signature(s) Signed: 06/27/2022 3:35:42 PM By: [name] Entered By: [name] on 06/27/2022 10:20:03 -------------------------------------------------------------------------------- Pain Assessment Details Patient Name: Date of Service: [name], Dickey [name] 06/27/2022 10:15 A M Medical Record Number: [id number] Patient Account Number: [id number] Date of Birth/Sex: Treating RN: [birth year] (52 y.o. F) Primary Care Lorea Kupfer: [name] Other Clinician: Referring Any Mcneice: Treating Geneve Kimpel/Extender: [name] Weeks in Treatment: 77 Active Problems Location of Pain Severity and Description of Pain Patient Has Paino Yes Site Locations Rate the pain. Current Pain Level: 3 Worst Pain Level: 10 Least Pain Level: 0 Tolerable Pain  Level: 2 Pain Management and Medication Current Pain Management: Electronic Signature(s) Signed: 06/27/2022 11:27:16 AM By: [name] Entered By: [name] on 06/27/2022 09:54:34 -------------------------------------------------------------------------------- Patient/Caregiver Education Details Patient Name: Date of Service: [name] URO [name], [name] 2/8/2024andnbsp10:15 [place] Record Number: [id number] Patient Account Number: [id number] Date of Birth/Gender: Treating RN: 1970-06-18 (52  y.o. [name] Primary Care Physician: [name] Other Clinician: Referring Physician: Treating Physician/Extender: [name] Weeks in Treatment: [address] [place], [name] Rams ([id number]) [id number] Page 7 of 10 Education Provided To: Patient Education Topics Provided Safety: Methods: Explain/Verbal Responses: Reinforcements needed, State content correctly Electronic Signature(s) Signed: 06/27/2022 3:35:42 PM By: [name] Entered By: [name] on 06/27/2022 10:20:21 -------------------------------------------------------------------------------- Wound Assessment Details Patient Name: Date of Service: [name], [name] 06/27/2022 10:15 A M Medical Record Number: [id number] Patient Account Number: [id number] Date of Birth/Sex: Treating RN: 05-16-1971 (52 y.o. [name] Primary Care Ronrico Dupin: [name] Other Clinician: Referring Zainab Crumrine: Treating Mykiah Schmuck/Extender: [name] Weeks in Treatment: 52 Wound Status Wound Number: 17 Primary Etiology: Sickle Cell Lesion Wound Location: Right, Lateral Lower Leg Wound Status: Open Wounding Event: Gradually Appeared Comorbid History: Anemia, Sickle Cell Disease, Neuropathy Date Acquired: 10/05/2012 Weeks Of Treatment: 62 Clustered Wound: Yes Photos Wound Measurements Length: (cm) 6 Width: (cm) 5.2 Depth: (cm) 0.1 Area: (cm) 24.504 Volume: (cm) 2.45 % Reduction in Area: 87% % Reduction in Volume: 87% Epithelialization: Small (1-33%) Tunneling: No Undermining: No Wound Description Classification: Full Thickness Without Exposed Support Structures Wound Margin: Distinct, outline attached Exudate Amount: Medium Exudate Type: Serosanguineous Exudate Color: red, brown Foul Odor After Cleansing: No Slough/Fibrino Yes Wound Bed Granulation Amount: Medium (34-66%) Exposed Structure Necrotic  Amount: Medium (34-66%) Fascia Exposed: No Fat Layer (Subcutaneous Tissue) Exposed: Yes Tendon Exposed: No [name], [name] Rams ([id number]) [id number] Page 8 of 10 Muscle Exposed: No Joint Exposed: No Bone Exposed: No Periwound Skin Texture Texture Color No Abnormalities Noted: No No Abnormalities Noted: Yes Scarring: Yes Temperature / Pain Temperature: No Abnormality Moisture No Abnormalities Noted: Yes Treatment Notes Wound #17 (Lower Leg) Wound Laterality: Right, Lateral Cleanser Soap and Water Discharge Instruction: May shower and wash wound with dial antibacterial soap and water prior to dressing change. Wound Cleanser Discharge Instruction: Cleanse the wound with wound cleanser prior to applying a clean dressing using gauze sponges, not tissue or cotton balls. Peri-Wound Care Triamcinolone 15 (g) Discharge Instruction: Use triamcinolone 15 (g) as directed Sween Lotion (Moisturizing lotion) Discharge Instruction: Apply moisturizing lotion as directed Topical Skintegrity Hydrogel 4 (oz) Discharge Instruction: Apply hydrogel as directed Primary Dressing ADAPTIC TOUCH 3x4.25 (in/in) Discharge Instruction: Apply to wound bed as instructed Secondary Dressing ABD Pad, 5x9 Discharge Instruction: Apply over primary dressing as directed. Secured With Elastic Bandage 4 inch (ACE bandage) Discharge Instruction: Secure with ACE bandage as directed. Compression Wrap Kerlix Roll 4.5x3.1 (in/yd) Discharge Instruction: Apply Kerlix and Coban compression as directed. Compression Stockings Add-Ons Electronic Signature(s) Signed: 06/27/2022 3:35:42 PM By: [name] Entered By: [name] on 06/27/2022 10:09:47 -------------------------------------------------------------------------------- Wound Assessment Details Patient Name: Date of Service: [name], [name] 06/27/2022 10:15 A M Medical Record Number: [id number] Patient Account Number:  [id number] Date of Birth/Sex: Treating RN: 16-Feb-1971 (52 y.o. [name] Primary Care Gracin Mcpartland: [name] Other Clinician: Referring Aniqa Hare: Treating Zaiyah Sottile/Extender: [name] Weeks in Treatment: [address] Wound Status [place], [name] Rams ([id number]) [id number] Page 9 of 10 Wound Number: 21 Primary Etiology: Sickle Cell Lesion Wound Location: Right, Medial Ankle  Secondary Etiology: Venous Leg Ulcer Wounding Event: Gradually Appeared Wound Status: Open Date Acquired: 06/26/2021 Comorbid History: Anemia, Sickle Cell Disease, Neuropathy Weeks Of Treatment: 52 Clustered Wound: No Photos Wound Measurements Length: (cm) 7.5 Width: (cm) 3.2 Depth: (cm) 0.1 Area: (cm) 18.85 Volume: (cm) 1.885 % Reduction in Area: 35% % Reduction in Volume: 35% Epithelialization: Small (1-33%) Tunneling: No Undermining: No Wound Description Classification: Full Thickness Without Exposed Support Structures Wound Margin: Distinct, outline attached Exudate Amount: Medium Exudate Type: Serosanguineous Exudate Color: red, brown Foul Odor After Cleansing: No Slough/Fibrino Yes Wound Bed Granulation Amount: Medium (34-66%) Exposed Structure Granulation Quality: Pink Fascia Exposed: No Necrotic Amount: Medium (34-66%) Fat Layer (Subcutaneous Tissue) Exposed: Yes Necrotic Quality: Eschar, Adherent Slough Tendon Exposed: No Muscle Exposed: No Joint Exposed: No Bone Exposed: No Periwound Skin Texture Texture Color No Abnormalities Noted: No No Abnormalities Noted: Yes Scarring: Yes Temperature / Pain Temperature: No Abnormality Moisture No Abnormalities Noted: Yes Treatment Notes Wound #21 (Ankle) Wound Laterality: Right, Medial Cleanser Soap and Water Discharge Instruction: May shower and wash wound with dial antibacterial soap and water prior to dressing change. Wound Cleanser Discharge Instruction: Cleanse the wound with wound  cleanser prior to applying a clean dressing using gauze sponges, not tissue or cotton balls. Peri-Wound Care Triamcinolone 15 (g) Discharge Instruction: Use triamcinolone 15 (g) as directed Sween Lotion (Moisturizing lotion) Discharge Instruction: Apply moisturizing lotion as directed Topical Primary Dressing ADAPTIC TOUCH 3x4.25 (in/in) Discharge Instruction: Apply to wound bed as instructed KENEDEE, MOLESKY ([id number]) [id number] Page 10 of 10 Secondary Dressing ABD Pad, 5x9 Discharge Instruction: Apply over primary dressing as directed. Secured With Elastic Bandage 4 inch (ACE bandage) Discharge Instruction: Secure with ACE bandage as directed. Compression Wrap Kerlix Roll 4.5x3.1 (in/yd) Discharge Instruction: Apply Kerlix and Coban compression as directed. Compression Stockings Add-Ons Electronic Signature(s) Signed: 06/27/2022 3:35:42 PM By: [name] Entered By: [name] on 06/27/2022 10:10:12 -------------------------------------------------------------------------------- Vitals Details Patient Name: Date of Service: [name] URO [name], [name] 06/27/2022 10:15 A M Medical Record Number: [id number] Patient Account Number: [id number] Date of Birth/Sex: Treating RN: 05/16/1971 (52 y.o. F) Primary Care Trulee Hamstra: [name] Other Clinician: Referring Vianna Venezia: Treating Somara Frymire/Extender: [name] Weeks in Treatment: 68 Vital Signs Time Taken: 09:53 Temperature (F): 98.2 Height (in): 67 Pulse (bpm): 75 Weight (lbs): 134 Respiratory Rate (breaths/min): 20 Body Mass Index (BMI): 21 Blood Pressure (mmHg): 133/73 Reference Range: 80 - 120 mg / dl Electronic Signature(s) Signed: 06/27/2022 11:27:16 AM By: [name] Entered By: [name] on 06/27/2022 09:54:17

## 2022-06-27 NOTE — Progress Notes (Signed)
SOMALY, MARTENEY ([id number]) [id number] Page 1 of 14 Visit Report for 06/27/2022 Chief Complaint Document Details Patient Name: Date of Service: [name] [name] 06/27/2022 10:15 A M Medical Record Number: [id number] Patient Account Number: [id number] Date of Birth/Sex: Treating RN: [birth year] (52 y.o. F) Primary Care Provider: [name] Other Clinician: Referring Provider: Treating Provider/Extender: Elyse Jarvis Weeks in Treatment: 2 Information Obtained from: Patient Chief Complaint the patient is here for evaluation of her bilateral lower extremity sickle cell ulcers 04/17/2021; patient comes in for substantial wounds on the right and left lower leg Electronic Signature(s) Signed: 06/27/2022 10:17:08 AM By: Fredirick Maudlin MD FACS Entered By: Fredirick Maudlin on 06/27/2022 10:17:08 -------------------------------------------------------------------------------- HPI Details Patient Name: Date of Service: [name] [name], [name] 06/27/2022 10:15 A M Medical Record Number: [id number] Patient Account Number: [id number] Date of Birth/Sex: Treating RN: 1971/03/18 (52 y.o. F) Primary Care Provider: [name] Other Clinician: Referring Provider: Treating Provider/Extender: Elyse Jarvis Weeks in Treatment: 15 History of Present Illness Location: medial and lateral ankle region on the right and left medial malleolus Quality: Patient reports experiencing a shooting pain to affected area(s). Severity: Patient states wound(s) are getting worse. Duration: right lower extremity bimalleolar ulcers have been present for approximately 2 years; the rright meedial malleolus ulcer has been there proximally 6 months Timing: Pain in wound is constant (hurts all the time) Context: The wound would happen gradually ssociated Signs and Symptoms: Patient reports having increase discharge. A HPI Description: 52 year old patient with  a history of sickle cell anemia who was last seen by me with ulceration of the right lower extremity above the ankle and was referred to Dr. Leland Johns for a surgical debridement as I was unable to do anything in the office due to excruciating pain. At that stage she was referred from the plastic surgery service to dermatology who treated her for a skin infection with doxycycline and then Levaquin and a local antibiotic ointment. I understand the patient has since developed ulceration on the left ankle both medial and lateral and was now referred back to the wound center as dermatology has finished the management. I do not have any notes from the dermatology department Old notes: 52 year old patient with a history of sickle cell anemia, pain bilateral lower extremities, right lower extremity ulcer and has a history of receiving a skin graft( Theraskin) several months ago. She has been visiting the wound center [facility] and was seen by Dr. Dellia Nims and Dr. Leland Johns. after prolonged conservator therapy between July 2016 and January 2017. She had been seen by the plastic surgeon and taken to the OR for debridement and application of Theraskin. She had 3 applications of Theraskin and was then treated with collagen. Prior to that she had a history of similar problems in 2014 and was treated conservatively. Had a reflux study done for the right lower extremity in August 2016 without reflux or DVT . Past medical history significant for sickle cell disease, anemia, leg ulcers, cholelithiasis,and has never been a smoker. Once the patient was discharged on the wound center she says within 2 or 3 weeks the problems recurred and she has been treating it conservatively. since I saw her 3 weeks ago at [facility] she has been unable to get her dressing material but has completed a course of doxycycline. 6/7/ 2017 -- lower extremity venous duplex reflux evaluation was done No evidence of SVT or DVT in the RLL. No  venous incompetence in the RLL. No further  vascular workup is indicated at this time. She was seen by Dr. [name], on 10/04/2015. She agreed with the plan of taking her to the OR for debridement and application of theraskin and would also take biopsies to rule out pyoderma gangrenosum. GLENISHA, [name] ([id number]) [id number] Page 2 of 14 Follow-up note dated May 31 received and she was status post application of Theraskin to multiple ulcers around the right ankle. Pathology did not show evidence of malignancy or pyoderma gangrenosum. She would continue to see as in the wound clinic for further care and see Dr. Leland Johns as needed. The patient brought the biopsy report and it was consistent with stasis ulcer no evidence of malignancy and the comment was that there was some adjacent neovascularization, fibrosis and patchy perivascular chronic inflammation. 11/15/2015 -- today we applied her first application of Theraskin 11/30/15; TheraSkin #2 12/13/2015 -- she is having a lot of pain locally and is here for possible application of a theraskin today. 01/16/2016 -- the patient has significant pain and has noticed despite in spite of all local care and oral pain medication. It is impossible to debride her in the office. 02/06/2016 -- I do not see any notes from Dr. Iran Planas( the patient has not made a call to the office know as she heard from them) and the only visit to recently was with her PCP Dr. Danella Penton -- I saw her on 01/16/2016 and prescribed 90 tablets of oxycodone 10 mg and did lab work and screening for HIV. the HIV was negative and hemoglobin was 6.3 with a WBC count of 14.9 and hematocrit of 17.8 with platelets of 561. 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 The bimalleolar ulcers to the right lower extremity have been present for  approximately 2 years, the left medial malleolus ulcer has been present approximately 6 months. She has followed with Dr.Thimmappa in the past and has had a total of 3 applications of Theraskin (01/2015, 09/2015, 06/17/16). She has also followed with Dr. Con Memos here in the clinic and has received 2 applications of TheraSkin (11/10/15, 11/30/15). The patient does experience chronic, and is not amenable to debridement. 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 She is not currently on any antibiotic therapy and has not been treated with any recently. 07/17/2016 -- was seen by Dr. Iran Planas of plastic surgery who saw her 2 weeks postop application of Theraskin #3. She had removed her dressing and asked her to apply silver alginate on alternate days and follow-up back with the wound center. Future debridements and application of skin substitute would have to be done in the hospital due to her high risk for anesthesia. READMISSION 04/17/2021 Patient is now a 52 year old woman that we have had in this clinic for a prolonged period of time and 2016-2017 and then again for 2 visits in February 2018. At that point she had wounds on the right lower leg predominantly medial. She had also been seen by plastic surgery Dr. Leland Johns who I believe took her to the OR for operative debridement and application of TheraSkin in 2017. After she left our clinic she was followed for a very prolonged period of time in the wound care center in [facility] who then referred her ultimately to [facility] where she was seen by Dr. Vernona Rieger. Again taken her to the OR for skin grafting which apparently did  not take. She had multiple other attempts at dressings although I have not really looked over all of these notes in great detail. She has not been seen in a wound care center in about a year. She states over the last year in addition to her right lower leg she has developed wounds on the left lower leg  quite extensive. She is using Xeroform to all of these wounds without really any improvement. She also has Medicaid which does not cover wound products. The patient has had vascular work-ups in the past including most recently on 03/28/2021 showing biphasic waveforms on the right triphasic at the PTA and biphasic at the dorsalis pedis on the left. She was unable to tolerate any degree of compression to do ABIs. Unfortunately TBI's were also not done. She had venous reflux studies done in 2017. This did not show any evidence of a DVT or SVT and no venous incompetence was noted in the right leg at the time this was the only side with the wound As noted I did not look all over her old records. She apparently had a course of HBO and [facility] although I am not sure what the indication would have been. In any case she developed seizures and terminated treatment earlier. She is generally much more disabled than when we last saw her in clinic. She can no longer walk pretty much wheelchair-bound because predominantly of pain in the left hip. 04/24/2021; the patient tolerated the wraps we put on. We used Santyl and Hydrofera Blue under compression. I brought her back for a nurse visit for a change in dressing. With Medicaid we will have a hard time getting anything paid for and hence the need for compression. She arrives in clinic with all the wounds looking somewhat better in terms of surface 12/20; circumferential wound on the right from the lateral to the medial. She has open areas on the left medial and left lateral x2 on all of this with the same surface. This does not look completely healthy although she does have some epithelialization. She is not complaining of a lot of pain which is unusual for her sickle ulcers. I have not looked over her extensive records from [facility]. She had recent arterial studies and has a history of venous reflux studies I will need to look these over although I do not believe she  has significant arterial disease 2023 05/22/2021; patient's wound areas measure slightly smaller. Still a lot of drainage coming from the right we have been using Hydrofera Blue and Santyl with some improvement in the wound surfaces. She tells me she will be getting transfused later in the week for her underlying sickle cell anemia I have looked over her recent arterial studies which were done in the fall. This was in November and showed biphasic and triphasic waveforms but she could not tolerate ABIs because of pressure and unfortunately TBI's were not done. She has not had recent venous reflux studies that I can see 1/10; not much change about the same surface area. This has a yellowish surface to it very gritty. We have been using Santyl and Hydrofera Blue for a prolonged period. Culture I did last week showed methicillin sensitive staph aureus "rare". Our intake nurse reports greenish drainage which may be the Hydrofera Blue itself 1/17; wounds are continue to measure smaller although I am not sure about the accuracy here. Especially the areas on the right are covered in what looks to be a nonviable surface although she  does have some epithelialization. Similarly she has areas on the left medial and left lateral ankle area which appear to have a better surface and perhaps are slightly smaller. We have been using Santyl and Hydrofera Blue. She cannot tolerate mechanical debridements She went for her reflux studies which showed significant reflux at the greater saphenous vein at the saphenofemoral junction as well as the greater saphenous vein in the proximal calf on the left she had reflux in the thigh and the common femoral vein and supra vein Fishel vein reflux in the greater saphenous vein. I will have vein and vascular look at this. My thoughts have been that these are likely sickle wounds. I looked through her old records from [facility] wound care center and then when she graduated to [facility] wound care center where she saw Dr. Zigmund Daniel and Dr. Vernona Rieger. Although I can see she had reflux studies done I do not see that she actually saw a vein and vascular. I went over the fact that she had operative debridements and actual skin grafting that did not take. I do not think these wounds have ever really progressed towards healing 1/31;Substantial wounds on the right ankle area. Hyper granulated very gritty adherent debris on the surface. 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 On the left she has no DVT or SVT deep vein reflux in the common femoral vein. Superficial vein reflux in the greater saphenous vein on although the vein diameter is not really all that large. I do not think there is anything that can be done with these although I am going to send her for consultation to vein and vascular. 2/7; Wound exam; substantial wound area on the right posterior ankle area and areas on the left medial ankle and left lateral ankle. I was able to debride the left medial ankle last week fairly aggressively and it is back this week to a completely nonviable surface She will see vascular surgery this Friday and I would like them to review the venous studies and also any comments on her arterial status. If they do not see an issue here I am going to refer her to plastic surgery for an operative debridement perhaps intraoperative ACell or Integra. Eventually she will require a deep MAURIANNA, BENARD ([id number]) [id number] Page 3 of 14 tissue culture again 2/14; substantial wound area on the right posterior  ankle, medial ankle. We have been using silver alginate The patient was seen by vein and vascular she had both venous reflux studies and arterial studies. 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 She had no evidence of superficial venous thrombosis. She did have superficial vein reflux at the saphenofemoral junction and the greater saphenous vein. On the left no evidence of a DVT no evidence of superficial venous throat thrombosis she did have deep vein reflux in the common femoral vein and superficial vein reflux in the greater saphenous vein but these were not felt to be amenable to ablation. In terms of arterial studies she had triphasic and wife  biphasic wave waveforms bilaterally not felt to have a significant arterial issue. I do not get the feeling that they felt that any part of her nonhealing wounds were related to either arterial or venous issues. They did note that she had venous reflux at the right at the [facility] and [facility]. And also on the left there were reflux in the deep system at the common femoral vein and greater saphenous vein in the proximal thigh. Nothing amenable to ablation. 2/20; she is making some decent progress on the right where there is nice skin between the 2 open areas on the right ankle. The surfaces here do not look viable yet there is some surrounding epithelialization. She still has a small area on the left medial ankle area. Hyper-granulated Jody's away always 2/28 patient has an appointment with plastic surgery on 3/8. We will see her back on 3/9. She may have to call us to get the area redressed. We've been using Santyl under silver alginate. We made a nice improvement on the left medial ankle. The larger wounds on the right also looks somewhat better in terms of epithelialization although I think they could benefit from an aggressive debridement if plastic surgery would be willing to do that.  Perhaps placement of Integra or a cell 07/26/2021: She saw Dr. Claudia Desanctis yesterday. He raised the question as to whether or not this might be pyoderma and wanted to wait until that question was answered by dermatology before proceeding with any sort of operative debridement. We have continue to use Santyl under silver alginate with Kerlix and Coban wraps. Overall, her wounds appear to be continuing to contract and epithelialize, with some granulation tissue present. There continues to be some slough on all wound surfaces. 08/09/2021: She has not been able to get an appointment with dermatology because apparently the offices in [place] do not accept Medicaid. She is looking into whether or not she can be seen at the main [facility] dermatology clinic. This is necessary because plastic surgery is concerned that her wounds might represent pyoderma and they did not want to do any procedure until that was clarified. We have been using Santyl under silver alginate with Kerlix and Coban wraps. Today, there was a greater amount of drainage on her dressings with a slight green discoloration and significant odor. Despite this, her wounds continue to contract and epithelialize. There is pale granulation tissue present and actually, on the left medial ankle, the granulation tissue is a bit hypertrophic. 08/16/2021: Last week, I took a culture and this grew back rare methicillin-resistant Staph aureus and rare corynebacterium. The MRSA was sensitive to gentamicin which we began applying topically on an empiric basis. This week, her wounds are a bit smaller and the drainage and odor are less. Her primary care provider is working on assisting the patient with a dermatology evaluation. She has been in silver alginate over the gentamicin that was started last week along with Kerlix and Coban wraps. 08/23/2021: Because she has Medicaid, we have been unable to get her into see any dermatologist in the  [place] to rule out pyoderma gangrenosum, which was a requirement from plastic surgery prior to any sort of debridement and grafting. Despite this, however, all of her wounds continue to get smaller. The wound on her left medial ankle is nearly closed. There is no odor from the wounds, although she still accumulates a modest amount of drainage on her dressings. 08/30/2021: The lateral right ankle wound and the  medial left ankle wound are a bit smaller today. The medial right ankle wound is about the same size. They are less tender. We have still been unable to get her into dermatology. 09/06/2021: All of the wounds are about the same size today. She continues to endorse minimal pain. I communicated with Dr. Claudia Desanctis in plastic surgery regarding our issues getting a dermatology appointment; he was out of town but indicated that he would look into perhaps performing the biopsy in his office and will have his office contact her. 09/14/2021: The patient has an appointment in dermatology, but it is not until October. Her wounds are roughly the same; she continues to have very thick purulent-looking drainage on her dressings. 09/20/2021: The left medial wound is nearly closed and just has a bit of accumulated eschar on the surface. The right medial and lateral ankle wounds are perhaps a little bit smaller. They continue to have a very pale surface with accumulation of thin slough. 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 I did not think [employer] was indicated based on this. She is getting topical mupirocin with Prisma silver collagen. 10/04/2021: The patient was not seen in clinic last week due to childcare coverage issues. In the interim, the left medial leg wound has closed. The right sided leg wounds are smaller. There is more granulation tissue coming through, particularly on the lateral wound. The surface remains somewhat gritty. We have been applying topical mupirocin and Prisma silver  collagen. 10/11/2021: The left medial leg wound remains closed. She does complain of some anesthetic sensation to the area. Both of the right-sided leg wounds are smaller but still have accumulated slough. 10/18/2021: Both right-sided leg wounds are minimally smaller this week. She still continues to accumulate slough and has thick drainage on her dressings. 10/23/2021: Both wounds continue to contract. There is still slough buildup. She has been approved for a keratin-based skin substitute trial product but it will not be available until next week. 10/30/2021: The wounds are about the same to perhaps slightly smaller. There is still continued slough buildup. Unfortunately, the rep for the keratin based product did not show up today and did not answer his phone when called. 11/08/2021: The wounds are little bit smaller today. She continues to have thick drainage but the surfaces are relatively clean with just a little bit of slough accumulation. She reported to me today that she is unable to completely flex her left ankle and on examination it seems this is potentially related to scar tissue from her wounds. We do have the ProgenaMatrix trial product available for her today. 11/15/2021: Both wounds are smaller today. There is some slough accumulation on the surfaces, but the medial wound, in particular looks like it is filling in and is less deep. She did hear from physical therapy and she is going to start working with them on July 11. She is here for her second application of the trial skin substitute, ProgenaMatrix. 11/22/2021: Both wounds continue to contract, the medial more dramatically than the lateral. Both wounds have a layer of slough on the surface, but underneath this, the gritty fibrous tissue has a little bit more of a pink cast to it rather than being as pale as it has been. 11/29/2021: The wounds are roughly the same size this week, perhaps a millimeter or 2 smaller. The medial wound has filled  in and is nearly flush with the surrounding skin surface. She continues to have a lot of  slough accumulation on both surfaces. 12/06/2021: No significant change to her wounds, but she has a new opening on her dorsal foot, just distal to the right lateral ankle wound. The area on her left medial ankle that reopened looks a little bit larger today. She has quite a bit of pain associated with the new wound. 12/12/2021: Her wounds look about the same but the new opening on her right lateral dorsal foot is a little bit bigger. She continues to have a fair amount of pain with this wound. 12/26/2021: The left medial ankle wound is tiny and superficial. She has 2 areas of crusting on her left lateral ankle, however, that appear to be threatening to SHANTRICE, RODENBERG ([id number]) [phone].pdf Page 4 of 14 open again. Her right medial ankle wound is a little bit smaller today but still continues to accumulate thick rubbery slough. The new dorsal foot wound is exquisitely painful but there is no odor or purulent drainage. No erythema or induration. The right lateral ankle wound looks about the same today, again with thick rubbery slough. 01/03/2022: The left medial ankle wound has closed again. Both right ankle wounds appear to be about the same size with thick rubbery slough. The dorsal foot wound on the right continues to be quite painful and she stated that she did not want any debridement of that site today. 01/10/2022: No real change to any of her wounds. She continues to accumulate thick slough. The dorsal foot wound has merged with the lateral malleolar wound. She is experiencing significant pain in the dorsal foot portion of the ulcer. 01/16/2022: Absolutely no change or progress in her wounds. 01/24/2022: Her wounds are unchanged. She continues to build up slough and the wounds on her dorsal right foot are still exquisitely tender. 01/31/2022: The wounds actually measure a little bit  narrower today. They still have thick slough on the surface but the underlying tissue seems a little less fibrotic. We changed to Iodosorb last week. 02/07/2022: Wounds continue to slowly epithelialize around the parameters. She has less pain today. She still accumulates a fairly substantial layer of slough. 02/15/2022: No change to the wounds overall. The measured a little bit larger today per the intake nurse. She continues to have substantial slough accumulation and her pain is a little bit worse. 02/21/2022. No change at all to her wounds. I do not see that plastic surgery has received her referral yet. 02/28/2022: The wounds remain unchanged. They are dry and fibrotic with accumulation of slough and eschar. She did receive an appointment to see plastic surgery on October 23, but the office called her back and indicated they needed to reschedule and that the next available appointment was not until December. The patient became angry and decided she did not want to see this plastics group. 10/19; the patient sees Dr. Lovett Calender again next week. She is using Medihoney as the primary dressing changing this herself. 03/21/2022: She saw Dr. Jaclynn Guarneri at the wound care center at [facility]. Dr. Jaclynn Guarneri was also unable to debride her, secondary to pain and is planning to take her to the operating room for operative debridement and potential skin substitute placement. Her wounds are unchanged with thick yellow slough and a fibrotic base. She says they are too painful to be debrided. In addition, yesterday her hemoglobin was 4 and she received 2 units packed red blood cell transfusion. 04/04/2022: Her operation at [facility] is scheduled for December 15. She continues to use Medihoney on her  wounds. They are a little bit less painful today and she is willing to entertain the possibility of debridement. The wounds measured a little bit larger in all dimensions today but the layer of slough is  not as thick as usual. 04/18/2022: Her wounds actually measured a little bit smaller today and the extension onto the dorsal foot from the lateral wound has healed. They are less tender and there is significantly less slough present as compared to prior visits. 05/09/2022: She had to reschedule her surgery due to lack of childcare. It is now planned for January 5. The wounds are basically unchanged, but she had a lot more drainage, which was thick and somewhat purulent in appearance. No significant odor. 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 They are quite painful today and she does not want to have debridement performed. 05/30/2022: The patient underwent surgical debridement and placement of TheraSkin to her wounds on January 5. She has been doing well and does not endorse any pain. The TheraSkin is intact and looks beautiful. It is sutured in place. There is no exudate or significant drainage. 06/04/2022: Her TheraSkin remains intact and I am starting to see granulation tissue emerging underneath the surface. No concern for infection. 06/12/2022: The TheraSkin is intact and there is more granulation tissue emerging. It is starting to look a little bit dry, however. No malodor or purulent drainage. 06/19/2022: The TheraSkin continues to look good. The edges are a bit dry, but the central portion of each of her wounds is showing a nice pink color. 06/27/2022: The TheraSkin on the lateral wound remains almost completely intact. There is nice pink tissue peaking through the mesh of the TheraSkin. On the medial leg, it is starting to breakdown a little bit, but most of the TheraSkin remains intact here, as well. Both wounds measured smaller today. Electronic Signature(s) Signed: 06/27/2022 10:20:24 AM By: Fredirick Maudlin MD FACS Entered By: Fredirick Maudlin on 06/27/2022 10:20:24 -------------------------------------------------------------------------------- Physical Exam Details Patient  Name: Date of Service: [name] [name], [name] 06/27/2022 10:15 A M Medical Record Number: [id number] Patient Account Number: [id number] Date of Birth/Sex: Treating RN: [birth year] (52 y.o. F) Primary Care Provider: [name] Other Clinician: Referring Provider: Treating Provider/Extender: Elyse Jarvis Weeks in Treatment: 44 Constitutional . . . . no acute distress. Respiratory Normal work of breathing on room air. SAPHIRA, LAHMANN ([id number]) [id number] Page 5 of 14 Notes 06/27/2022: The TheraSkin on the lateral wound remains almost completely intact. There is nice pink tissue peaking through the mesh of the TheraSkin. On the medial leg, it is starting to breakdown a little bit, but most of the TheraSkin remains intact here, as well. Both wounds measured smaller today. Electronic Signature(s) Signed: 06/27/2022 10:29:37 AM By: Fredirick Maudlin MD FACS Previous Signature: 06/27/2022 10:20:42 AM Version By: Fredirick Maudlin MD FACS Entered By: Fredirick Maudlin on 06/27/2022 10:29:37 -------------------------------------------------------------------------------- Physician Orders Details Patient Name: Date of Service: [name] [name], [name] 06/27/2022 10:15 A M Medical Record Number: [id number] Patient Account Number: [id number] Date of Birth/Sex: Treating RN: August 13, 1970 (52 y.o. [name] Primary Care Provider: [name] Other Clinician: Referring Provider: Treating Provider/Extender: Elyse Jarvis Weeks in Treatment: 57 Verbal / Phone Orders: No Diagnosis Coding ICD-10 Coding Code Description L97.818 Non-pressure chronic ulcer of other part of right lower leg with other specified severity 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 Follow-up Appointments ppointment in 1 week. - Dr. Celine Ahr - room 3 Return A Cellular or Tissue Based Products  Other Cellular or Tissue Based Products Orders/Instructions: -  Theraskin applied by Psychiatric nurse, leave on until further notice Bathing/ Shower/ Hygiene Do not shower or bathe in tub. Edema Control - Lymphedema / SCD / Other Avoid standing for long periods of time. Exercise regularly Additional Orders / Instructions Follow Nutritious Diet Wound Treatment Wound #17 - Lower Leg Wound Laterality: Right, Lateral Cleanser: Soap and Water Every Other Day/30 Days Discharge Instructions: May shower and wash wound with dial antibacterial soap and water prior to dressing change. Cleanser: Wound Cleanser Every Other Day/30 Days Discharge Instructions: Cleanse the wound with wound cleanser prior to applying a clean dressing using gauze sponges, not tissue or cotton balls. Peri-Wound Care: Triamcinolone 15 (g) Every Other Day/30 Days Discharge Instructions: Use triamcinolone 15 (g) as directed Peri-Wound Care: Sween Lotion (Moisturizing lotion) Every Other Day/30 Days Discharge Instructions: Apply moisturizing lotion as directed Topical: Skintegrity Hydrogel 4 (oz) Every Other Day/30 Days Discharge Instructions: Apply hydrogel as directed Prim Dressing: [place] 3x4.25 (in/in) Every Other Day/30 Days ary Discharge Instructions: Apply to wound bed as instructed Secondary Dressing: ABD Pad, 5x9 Every Other Day/30 Days Discharge Instructions: Apply over primary dressing as directed. Secured With: Elastic Bandage 4 inch (ACE bandage) Every Other Day/30 Days Discharge Instructions: Secure with ACE bandage as directed. SANSA, ALKEMA ([id number]) [id number] Page 6 of 14 Compression Wrap: Kerlix Roll 4.5x3.1 (in/yd) Every Other Day/30 Days Discharge Instructions: Apply Kerlix and Coban compression as directed. Wound #21 - Ankle Wound Laterality: Right, Medial Cleanser: Soap and Water Every Other Day/30 Days Discharge Instructions: May shower and wash wound with dial antibacterial soap and water prior to dressing  change. Cleanser: Wound Cleanser Every Other Day/30 Days Discharge Instructions: Cleanse the wound with wound cleanser prior to applying a clean dressing using gauze sponges, not tissue or cotton balls. Peri-Wound Care: Triamcinolone 15 (g) Every Other Day/30 Days Discharge Instructions: Use triamcinolone 15 (g) as directed Peri-Wound Care: Sween Lotion (Moisturizing lotion) Every Other Day/30 Days Discharge Instructions: Apply moisturizing lotion as directed Prim Dressing: ADAPTIC TOUCH 3x4.25 (in/in) Every Other Day/30 Days ary Discharge Instructions: Apply to wound bed as instructed Secondary Dressing: ABD Pad, 5x9 Every Other Day/30 Days Discharge Instructions: Apply over primary dressing as directed. Secured With: Elastic Bandage 4 inch (ACE bandage) Every Other Day/30 Days Discharge Instructions: Secure with ACE bandage as directed. Compression Wrap: Kerlix Roll 4.5x3.1 (in/yd) Every Other Day/30 Days Discharge Instructions: Apply Kerlix and Coban compression as directed. Electronic Signature(s) Signed: 06/27/2022 10:55:37 AM By: Fredirick Maudlin MD FACS Entered By: Fredirick Maudlin on 06/27/2022 10:29:50 -------------------------------------------------------------------------------- Problem List Details Patient Name: Date of Service: [name] [name], [name] 06/27/2022 10:15 A M Medical Record Number: [id number] Patient Account Number: [id number] Date of Birth/Sex: Treating RN: 10-18-1970 (52 y.o. F) Primary Care Provider: [name] Other Clinician: Referring Provider: Treating Provider/Extender: Elyse Jarvis Weeks in Treatment: 76 Active Problems ICD-10 Encounter Code Description Active Date MDM Diagnosis L97.818 Non-pressure chronic ulcer of other part of right lower leg with other specified 04/17/2021 No Yes severity 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 04/17/2021 No Yes Inactive Problems ICD-10 Code Description Active Date Inactive Date L97.828  Non-pressure chronic ulcer of other part of left lower leg with other specified severity 04/17/2021 04/17/2021 [name] ([id number]) [id number] Page 7 of 14 Resolved Problems Electronic Signature(s) Signed: 06/27/2022 10:16:54 AM By: Fredirick Maudlin MD FACS Entered By: Fredirick Maudlin on 06/27/2022 10:16:54 -------------------------------------------------------------------------------- Progress Note Details Patient Name: Date of Service: [name] UMA, [name] 06/27/2022  10:15 A M Medical Record Number: [id number] Patient Account Number: [id number] Date of Birth/Sex: Treating RN: September 08, 1970 (52 y.o. F) Primary Care Provider: [name] Other Clinician: Referring Provider: Treating Provider/Extender: Elyse Jarvis Weeks in Treatment: 53 Subjective Chief Complaint Information obtained from Patient the patient is here for evaluation of her bilateral lower extremity sickle cell ulcers 04/17/2021; patient comes in for substantial wounds on the right and left lower leg History of Present Illness (HPI) The following HPI elements were documented for the patient's wound: Location: medial and lateral ankle region on the right and left medial malleolus Quality: Patient reports experiencing a shooting pain to affected area(s). Severity: Patient states wound(s) are getting worse. Duration: right lower extremity bimalleolar ulcers have been present for approximately 2 years; the rright meedial malleolus ulcer has been there proximally 6 months Timing: Pain in wound is constant (hurts all the time) Context: The wound would happen gradually Associated Signs and Symptoms: Patient reports having increase discharge. 52 year old patient with a history of sickle cell anemia who was last seen by me with ulceration of the right lower extremity above the ankle and was referred to Dr. Leland Johns for a surgical debridement as I was unable to do anything in  the office due to excruciating pain. At that stage she was referred from the plastic surgery service to dermatology who treated her for a skin infection with doxycycline and then Levaquin and a local antibiotic ointment. I understand the patient has since developed ulceration on the left ankle both medial and lateral and was now referred back to the wound center as dermatology has finished the management. I do not have any notes from the dermatology department Old notes: 52 year old patient with a history of sickle cell anemia, pain bilateral lower extremities, right lower extremity ulcer and has a history of receiving a skin graft( Theraskin) several months ago. She has been visiting the wound center [facility] and was seen by Dr. Dellia Nims and Dr. Leland Johns. after prolonged conservator therapy between July 2016 and January 2017. She had been seen by the plastic surgeon and taken to the OR for debridement and application of Theraskin. She had 3 applications of Theraskin and was then treated with collagen. Prior to that she had a history of similar problems in 2014 and was treated conservatively. Had a reflux study done for the right lower extremity in August 2016 without reflux or DVT . Past medical history significant for sickle cell disease, anemia, leg ulcers, cholelithiasis,and has never been a smoker. Once the patient was discharged on the wound center she says within 2 or 3 weeks the problems recurred and she has been treating it conservatively. since I saw her 3 weeks ago at [facility] she has been unable to get her dressing material but has completed a course of doxycycline. 6/7/ 2017 -- lower extremity venous duplex reflux evaluation was done oo No evidence of SVT or DVT in the RLL. No venous incompetence in the RLL. No further vascular workup is indicated at this time. She was seen by Dr. [name], on 10/04/2015. She agreed with the plan of taking her to the OR for debridement and  application of theraskin and would also take biopsies to rule out pyoderma gangrenosum. Follow-up note dated May 31 received and she was status post application of Theraskin to multiple ulcers around the right ankle. Pathology did not show evidence of malignancy or pyoderma gangrenosum. She would continue to see as in the wound clinic for further care and  see Dr. Leland Johns as needed. The patient brought the biopsy report and it was consistent with stasis ulcer no evidence of malignancy and the comment was that there was some adjacent neovascularization, fibrosis and patchy perivascular chronic inflammation. 11/15/2015 -- today we applied her first application of Theraskin 11/30/15; TheraSkin #2 12/13/2015 -- she is having a lot of pain locally and is here for possible application of a theraskin today. 01/16/2016 -- the patient has significant pain and has noticed despite in spite of all local care and oral pain medication. It is impossible to debride her in the office. 02/06/2016 -- I do not see any notes from Dr. Iran Planas( the patient has not made a call to the office know as she heard from them) and the only visit to recently was with her PCP Dr. Danella Penton -- I saw her on 01/16/2016 and prescribed 90 tablets of oxycodone 10 mg and did lab work and screening for HIV. the HIV was negative and hemoglobin was 6.3 with a WBC count of 14.9 and hematocrit of 17.8 with platelets of 561. 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 The bimalleolar ulcers to the right lower extremity have been present for approximately 2 years, the left medial malleolus ulcer has been present approximately 6 months. She has followed with Dr.Thimmappa in the past and has had a total of 3 applications of Theraskin (01/2015, 09/2015, 06/17/16). She has also followed with Dr. Con Memos here in the clinic and has  received 2 applications of TheraSkin (11/10/15, 11/30/15). The patient does experience chronic, and is not amenable to debridement. 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 She is not currently on any antibiotic therapy and has not been treated with any recently. 07/17/2016 -- was seen by Dr. Iran Planas of plastic surgery who saw her 2 weeks postop application of Theraskin #3. She had removed her dressing and asked her to apply silver alginate on alternate days and follow-up back with the wound center. Future debridements and application of skin substitute would have to be KAYONNA, LAWNICZAK ([id number]) [id number] Page 8 of 14 done in the hospital due to her high risk for anesthesia. READMISSION 04/17/2021 Patient is now a 52 year old woman that we have had in this clinic for a prolonged period of time and 2016-2017 and then again for 2 visits in February 2018. At that point she had wounds on the right lower leg predominantly medial. She had also been seen by plastic surgery Dr. Leland Johns who I believe took her to the OR for operative debridement and application of TheraSkin in 2017. After she left our clinic she was followed for a very prolonged period of time in the wound care center in [facility] who then referred her ultimately to [facility] where she was seen by Dr. Vernona Rieger. Again taken her to the OR for skin grafting which apparently did not take. She had multiple other attempts at dressings although I have not really looked over all of these notes in great detail. She has not been seen in a wound care center in about a year. She states over the last year in addition to her right lower leg she has developed wounds on the left lower leg quite extensive. She is using Xeroform to all of these wounds without really any improvement. She also has Medicaid which does not cover wound products. The patient has had  vascular work-ups in the past  including most recently on 03/28/2021 showing biphasic waveforms on the right triphasic at the PTA and biphasic at the dorsalis pedis on the left. She was unable to tolerate any degree of compression to do ABIs. Unfortunately TBI's were also not done. She had venous reflux studies done in 2017. This did not show any evidence of a DVT or SVT and no venous incompetence was noted in the right leg at the time this was the only side with the wound As noted I did not look all over her old records. She apparently had a course of HBO and [facility] although I am not sure what the indication would have been. In any case she developed seizures and terminated treatment earlier. She is generally much more disabled than when we last saw her in clinic. She can no longer walk pretty much wheelchair-bound because predominantly of pain in the left hip. 04/24/2021; the patient tolerated the wraps we put on. We used Santyl and Hydrofera Blue under compression. I brought her back for a nurse visit for a change in dressing. With Medicaid we will have a hard time getting anything paid for and hence the need for compression. She arrives in clinic with all the wounds looking somewhat better in terms of surface 12/20; circumferential wound on the right from the lateral to the medial. She has open areas on the left medial and left lateral x2 on all of this with the same surface. This does not look completely healthy although she does have some epithelialization. She is not complaining of a lot of pain which is unusual for her sickle ulcers. I have not looked over her extensive records from [facility]. She had recent arterial studies and has a history of venous reflux studies I will need to look these over although I do not believe she has significant arterial disease 2023 05/22/2021; patient's wound areas measure slightly smaller. Still a lot of drainage coming from the right we have been using Hydrofera Blue and Santyl with  some improvement in the wound surfaces. She tells me she will be getting transfused later in the week for her underlying sickle cell anemia I have looked over her recent arterial studies which were done in the fall. This was in November and showed biphasic and triphasic waveforms but she could not tolerate ABIs because of pressure and unfortunately TBI's were not done. She has not had recent venous reflux studies that I can see 1/10; not much change about the same surface area. This has a yellowish surface to it very gritty. We have been using Santyl and Hydrofera Blue for a prolonged period. Culture I did last week showed methicillin sensitive staph aureus "rare". Our intake nurse reports greenish drainage which may be the Hydrofera Blue itself 1/17; wounds are continue to measure smaller although I am not sure about the accuracy here. Especially the areas on the right are covered in what looks to be a nonviable surface although she does have some epithelialization. Similarly she has areas on the left medial and left lateral ankle area which appear to have a better surface and perhaps are slightly smaller. We have been using Santyl and Hydrofera Blue. She cannot tolerate mechanical debridements She went for her reflux studies which showed significant reflux at the greater saphenous vein at the saphenofemoral junction as well as the greater saphenous vein in the proximal calf on the left she had reflux in the thigh and the common femoral vein and supra  vein Fishel vein reflux in the greater saphenous vein. I will have vein and vascular look at this. My thoughts have been that these are likely sickle wounds. I looked through her old records from [facility] wound care center and then when she graduated to [facility] wound care center where she saw Dr. Zigmund Daniel and Dr. Vernona Rieger. Although I can see she had reflux studies done I do not see that she actually saw a vein and vascular. I went over the fact  that she had operative debridements and actual skin grafting that did not take. I do not think these wounds have ever really progressed towards healing 1/31;Substantial wounds on the right ankle area. Hyper granulated very gritty adherent debris on the surface. 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 On the left she has no DVT or SVT deep vein reflux in the common femoral vein. Superficial vein reflux in the greater saphenous vein on although the vein diameter is not really all that large. I do not think there is anything that can be done with these although I am going to send her for consultation to vein and vascular. 2/7; Wound exam; substantial wound area on the right posterior ankle area and areas on the left medial ankle and left lateral ankle. I was able to debride the left medial ankle last week fairly aggressively and it is back this week to a completely nonviable surface She will see vascular surgery this Friday and I would like them to review the venous studies and also any comments on her arterial status. If they do not see an issue here I am going to refer her to plastic surgery for an operative debridement perhaps intraoperative ACell or Integra. Eventually she will require a deep tissue culture again 2/14; substantial wound area on the right posterior ankle, medial ankle. We have been using silver alginate The patient was seen by vein and vascular she had both venous reflux studies and arterial studies. 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 She had  no evidence of superficial venous thrombosis. She did have superficial vein reflux at the saphenofemoral junction and the greater saphenous vein. On the left no evidence of a DVT no evidence of superficial venous throat thrombosis she did have deep vein reflux in the common femoral vein and superficial vein reflux in the greater saphenous vein but these were not felt to be amenable to ablation. In terms of arterial studies she had triphasic and wife biphasic wave waveforms bilaterally not felt to have a significant arterial issue. I do not get the feeling that they felt that any part of her nonhealing wounds were related to either arterial or venous issues. They did note that she had venous reflux at the right at the [facility] and [facility]. And also on the left there were reflux in the deep system at the common femoral vein and greater saphenous vein in the proximal thigh. Nothing amenable to ablation. 2/20; she is making some decent progress on the right where there is nice skin  between the 2 open areas on the right ankle. The surfaces here do not look viable yet there is some surrounding epithelialization. She still has a small area on the left medial ankle area. Hyper-granulated Jody's away always 2/28 patient has an appointment with plastic surgery on 3/8. We will see her back on 3/9. She may have to call us to get the area redressed. We've been using Santyl under silver alginate. We made a nice improvement on the left medial ankle. The larger wounds on the right also looks somewhat better in terms of epithelialization although I think they could benefit from an aggressive debridement if plastic surgery would be willing to do that. Perhaps placement of Integra or a cell 07/26/2021: She saw Dr. Claudia Desanctis yesterday. He raised the question as to whether or not this might be pyoderma and wanted to wait until that question was answered NANNIE, STARZYK ([id number]) [phone].pdf Page 9 of 14 by  dermatology before proceeding with any sort of operative debridement. We have continue to use Santyl under silver alginate with Kerlix and Coban wraps. Overall, her wounds appear to be continuing to contract and epithelialize, with some granulation tissue present. There continues to be some slough on all wound surfaces. 08/09/2021: She has not been able to get an appointment with dermatology because apparently the offices in [place] do not accept Medicaid. She is looking into whether or not she can be seen at the main [facility] dermatology clinic. This is necessary because plastic surgery is concerned that her wounds might represent pyoderma and they did not want to do any procedure until that was clarified. We have been using Santyl under silver alginate with Kerlix and Coban wraps. Today, there was a greater amount of drainage on her dressings with a slight green discoloration and significant odor. Despite this, her wounds continue to contract and epithelialize. There is pale granulation tissue present and actually, on the left medial ankle, the granulation tissue is a bit hypertrophic. 08/16/2021: Last week, I took a culture and this grew back rare methicillin-resistant Staph aureus and rare corynebacterium. The MRSA was sensitive to gentamicin which we began applying topically on an empiric basis. This week, her wounds are a bit smaller and the drainage and odor are less. Her primary care provider is working on assisting the patient with a dermatology evaluation. She has been in silver alginate over the gentamicin that was started last week along with Kerlix and Coban wraps. 08/23/2021: Because she has Medicaid, we have been unable to get her into see any dermatologist in the [place] to rule out pyoderma gangrenosum, which was a requirement from plastic surgery prior to any sort of debridement and grafting. Despite this, however, all of her wounds continue to get smaller. The  wound on her left medial ankle is nearly closed. There is no odor from the wounds, although she still accumulates a modest amount of drainage on her dressings. 08/30/2021: The lateral right ankle wound and the medial left ankle wound are a bit smaller today. The medial right ankle wound is about the same size. They are less tender. We have still been unable to get her into dermatology. 09/06/2021: All of the wounds are about the same size today. She continues to endorse minimal pain. I communicated with Dr. Claudia Desanctis in plastic surgery regarding our issues getting a dermatology appointment; he was out of town but indicated that he would look into perhaps performing the biopsy in his office and will have his  office contact her. 09/14/2021: The patient has an appointment in dermatology, but it is not until October. Her wounds are roughly the same; she continues to have very thick purulent-looking drainage on her dressings. 09/20/2021: The left medial wound is nearly closed and just has a bit of accumulated eschar on the surface. The right medial and lateral ankle wounds are perhaps a little bit smaller. They continue to have a very pale surface with accumulation of thin slough. 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 I did not think [employer] was indicated based on this. She is getting topical mupirocin with Prisma silver collagen. 10/04/2021: The patient was not seen in clinic last week due to childcare coverage issues. In the interim, the left medial leg wound has closed. The right sided leg wounds are smaller. There is more granulation tissue coming through, particularly on the lateral wound. The surface remains somewhat gritty. We have been applying topical mupirocin and Prisma silver collagen. 10/11/2021: The left medial leg wound remains closed. She does complain of some anesthetic sensation to the area. Both of the right-sided leg wounds are smaller but still have accumulated  slough. 10/18/2021: Both right-sided leg wounds are minimally smaller this week. She still continues to accumulate slough and has thick drainage on her dressings. 10/23/2021: Both wounds continue to contract. There is still slough buildup. She has been approved for a keratin-based skin substitute trial product but it will not be available until next week. 10/30/2021: The wounds are about the same to perhaps slightly smaller. There is still continued slough buildup. Unfortunately, the rep for the keratin based product did not show up today and did not answer his phone when called. 11/08/2021: The wounds are little bit smaller today. She continues to have thick drainage but the surfaces are relatively clean with just a little bit of slough accumulation. She reported to me today that she is unable to completely flex her left ankle and on examination it seems this is potentially related to scar tissue from her wounds. We do have the ProgenaMatrix trial product available for her today. 11/15/2021: Both wounds are smaller today. There is some slough accumulation on the surfaces, but the medial wound, in particular looks like it is filling in and is less deep. She did hear from physical therapy and she is going to start working with them on July 11. She is here for her second application of the trial skin substitute, ProgenaMatrix. 11/22/2021: Both wounds continue to contract, the medial more dramatically than the lateral. Both wounds have a layer of slough on the surface, but underneath this, the gritty fibrous tissue has a little bit more of a pink cast to it rather than being as pale as it has been. 11/29/2021: The wounds are roughly the same size this week, perhaps a millimeter or 2 smaller. The medial wound has filled in and is nearly flush with the surrounding skin surface. She continues to have a lot of slough accumulation on both surfaces. 12/06/2021: No significant change to her wounds, but she has a new  opening on her dorsal foot, just distal to the right lateral ankle wound. The area on her left medial ankle that reopened looks a little bit larger today. She has quite a bit of pain associated with the new wound. 12/12/2021: Her wounds look about the same but the new opening on her right lateral dorsal foot is a little bit bigger. She continues to have a fair amount of pain  with this wound. 12/26/2021: The left medial ankle wound is tiny and superficial. She has 2 areas of crusting on her left lateral ankle, however, that appear to be threatening to open again. Her right medial ankle wound is a little bit smaller today but still continues to accumulate thick rubbery slough. The new dorsal foot wound is exquisitely painful but there is no odor or purulent drainage. No erythema or induration. The right lateral ankle wound looks about the same today, again with thick rubbery slough. 01/03/2022: The left medial ankle wound has closed again. Both right ankle wounds appear to be about the same size with thick rubbery slough. The dorsal foot wound on the right continues to be quite painful and she stated that she did not want any debridement of that site today. 01/10/2022: No real change to any of her wounds. She continues to accumulate thick slough. The dorsal foot wound has merged with the lateral malleolar wound. She is experiencing significant pain in the dorsal foot portion of the ulcer. 01/16/2022: Absolutely no change or progress in her wounds. 01/24/2022: Her wounds are unchanged. She continues to build up slough and the wounds on her dorsal right foot are still exquisitely tender. 01/31/2022: The wounds actually measure a little bit narrower today. They still have thick slough on the surface but the underlying tissue seems a little less fibrotic. We changed to Iodosorb last week. 02/07/2022: Wounds continue to slowly epithelialize around the parameters. She has less pain today. She still accumulates a  fairly substantial layer of slough. 02/15/2022: No change to the wounds overall. The measured a little bit larger today per the intake nurse. She continues to have substantial slough accumulation and her pain is a little bit worse. EDITH, GROLEAU ([id number]) [id number] Page 10 of 14 02/21/2022. No change at all to her wounds. I do not see that plastic surgery has received her referral yet. 02/28/2022: The wounds remain unchanged. They are dry and fibrotic with accumulation of slough and eschar. She did receive an appointment to see plastic surgery on October 23, but the office called her back and indicated they needed to reschedule and that the next available appointment was not until December. The patient became angry and decided she did not want to see this plastics group. 10/19; the patient sees Dr. Lovett Calender again next week. She is using Medihoney as the primary dressing changing this herself. 03/21/2022: She saw Dr. Jaclynn Guarneri at the wound care center at [facility]. Dr. Jaclynn Guarneri was also unable to debride her, secondary to pain and is planning to take her to the operating room for operative debridement and potential skin substitute placement. Her wounds are unchanged with thick yellow slough and a fibrotic base. She says they are too painful to be debrided. In addition, yesterday her hemoglobin was 4 and she received 2 units packed red blood cell transfusion. 04/04/2022: Her operation at [facility] is scheduled for December 15. She continues to use Medihoney on her wounds. They are a little bit less painful today and she is willing to entertain the possibility of debridement. The wounds measured a little bit larger in all dimensions today but the layer of slough is not as thick as usual. 04/18/2022: Her wounds actually measured a little bit smaller today and the extension onto the dorsal foot from the lateral wound has healed. They are less tender  and there is significantly less slough present as compared to prior visits. 05/09/2022: She had to reschedule her surgery  due to lack of childcare. It is now planned for January 5. The wounds are basically unchanged, but she had a lot more drainage, which was thick and somewhat purulent in appearance. No significant odor. 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 They are quite painful today and she does not want to have debridement performed. 05/30/2022: The patient underwent surgical debridement and placement of TheraSkin to her wounds on January 5. She has been doing well and does not endorse any pain. The TheraSkin is intact and looks beautiful. It is sutured in place. There is no exudate or significant drainage. 06/04/2022: Her TheraSkin remains intact and I am starting to see granulation tissue emerging underneath the surface. No concern for infection. 06/12/2022: The TheraSkin is intact and there is more granulation tissue emerging. It is starting to look a little bit dry, however. No malodor or purulent drainage. 06/19/2022: The TheraSkin continues to look good. The edges are a bit dry, but the central portion of each of her wounds is showing a nice pink color. 06/27/2022: The TheraSkin on the lateral wound remains almost completely intact. There is nice pink tissue peaking through the mesh of the TheraSkin. On the medial leg, it is starting to breakdown a little bit, but most of the TheraSkin remains intact here, as well. Both wounds measured smaller today. Patient History Information obtained from Patient. Family History Diabetes - Mother, Lung Disease - Mother, No family history of Cancer, Heart Disease, Hereditary Spherocytosis, Hypertension, Kidney Disease, Seizures, Stroke, Thyroid Problems, Tuberculosis. Social History Never smoker, Marital Status - Married, Alcohol Use - Never, Drug Use - No History, Caffeine Use - Daily. Medical History Eyes Denies history of Cataracts,  Glaucoma, Optic Neuritis Ear/Nose/Mouth/Throat Denies history of Chronic sinus problems/congestion, Middle ear problems Hematologic/Lymphatic Patient has history of Anemia, Sickle Cell Disease Denies history of Hemophilia, Human Immunodeficiency Virus, Lymphedema Respiratory Denies history of Aspiration, Asthma, Chronic Obstructive Pulmonary Disease (COPD), Pneumothorax, Sleep Apnea, Tuberculosis Cardiovascular Denies history of Angina, Arrhythmia, Congestive Heart Failure, Coronary Artery Disease, Deep Vein Thrombosis, Hypertension, Hypotension, Myocardial Infarction, Peripheral Arterial Disease, Peripheral Venous Disease, Phlebitis, Vasculitis Gastrointestinal Denies history of Cirrhosis , Colitis, Crohnoos, Hepatitis A, Hepatitis B, Hepatitis C Endocrine Denies history of Type I Diabetes, Type II Diabetes Genitourinary Denies history of End Stage Renal Disease Immunological Denies history of Lupus Erythematosus, Raynaudoos, Scleroderma Integumentary (Skin) Denies history of History of Burn Musculoskeletal Denies history of Gout, Rheumatoid Arthritis, Osteoarthritis, Osteomyelitis Neurologic Patient has history of Neuropathy - right foot intermittant Denies history of Dementia, Quadriplegia, Paraplegia, Seizure Disorder Oncologic Denies history of Received Chemotherapy, Received Radiation Psychiatric Denies history of Anorexia/bulimia, Confinement Anxiety Hospitalization/Surgery History - c section x2. - left breast lumpectomy. - iandD right ankle with theraskin. Medical A Surgical History Notes nd Constitutional Symptoms (General Health) H/O miscarriage Cardiovascular bradycardia Gastrointestinal cholilithiasis CEAZIA, HARB ([id number]) [id number] Page 11 of 14 Objective Constitutional no acute distress. Vitals Time Taken: 9:53 AM, Height: 67 in, Weight: 134 lbs, BMI: 21, Temperature: 98.2 F, Pulse: 75 bpm, Respiratory Rate: 20  breaths/min, Blood Pressure: 133/73 mmHg. Respiratory Normal work of breathing on room air. General Notes: 06/27/2022: The TheraSkin on the lateral wound remains almost completely intact. There is nice pink tissue peaking through the mesh of the TheraSkin. On the medial leg, it is starting to breakdown a little bit, but most of the TheraSkin remains intact here, as well. Both wounds measured smaller today. Integumentary (Hair, Skin) Wound #17 status is Open. Original cause of wound was Gradually Appeared.  The date acquired was: 10/05/2012. The wound has been in treatment 62 weeks. The wound is located on the Right,Lateral Lower Leg. The wound measures 6cm length x 5.2cm width x 0.1cm depth; 24.504cm^2 area and 2.45cm^3 volume. There is Fat Layer (Subcutaneous Tissue) exposed. There is no tunneling or undermining noted. There is a medium amount of serosanguineous drainage noted. The wound margin is distinct with the outline attached to the wound base. There is medium (34-66%) granulation within the wound bed. There is a medium (34- 66%) amount of necrotic tissue within the wound bed. The periwound skin appearance had no abnormalities noted for moisture. The periwound skin appearance had no abnormalities noted for color. The periwound skin appearance exhibited: Scarring. Periwound temperature was noted as No Abnormality. Wound #21 status is Open. Original cause of wound was Gradually Appeared. The date acquired was: 06/26/2021. The wound has been in treatment 52 weeks. The wound is located on the Right,Medial Ankle. The wound measures 7.5cm length x 3.2cm width x 0.1cm depth; 18.85cm^2 area and 1.885cm^3 volume. There is Fat Layer (Subcutaneous Tissue) exposed. There is no tunneling or undermining noted. There is a medium amount of serosanguineous drainage noted. The wound margin is distinct with the outline attached to the wound base. There is medium (34-66%) pink granulation within the wound bed. There  is a medium (34-66%) amount of necrotic tissue within the wound bed including Eschar and Adherent Slough. The periwound skin appearance had no abnormalities noted for moisture. The periwound skin appearance had no abnormalities noted for color. The periwound skin appearance exhibited: Scarring. Periwound temperature was noted as No Abnormality. 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 - Dr. Celine Ahr - room 3 Cellular or Tissue Based Products: Other Cellular or Tissue Based Products Orders/Instructions: - Theraskin applied by Psychiatric nurse, leave on until further notice Bathing/ Shower/ Hygiene: Do not shower or bathe in tub. Edema Control - Lymphedema / SCD / Other: Avoid standing for long periods of time. Exercise regularly Additional Orders / Instructions: Follow Nutritious Diet WOUND #17: - Lower Leg Wound Laterality: Right, Lateral Cleanser: Soap and Water Every Other Day/30 Days Discharge Instructions: May shower and wash wound with dial antibacterial soap and water prior to dressing change. Cleanser: Wound Cleanser Every Other Day/30 Days Discharge Instructions: Cleanse the wound with wound cleanser prior to applying a clean dressing using gauze sponges, not tissue or cotton balls. Peri-Wound Care: Triamcinolone 15 (g) Every Other Day/30 Days Discharge Instructions: Use triamcinolone 15 (g) as directed Peri-Wound Care: Sween Lotion (Moisturizing lotion) Every Other Day/30 Days Discharge Instructions: Apply moisturizing lotion as directed Topical: Skintegrity Hydrogel 4 (oz) Every Other Day/30 Days Discharge Instructions: Apply hydrogel as directed Prim Dressing: [place] 3x4.25 (in/in) Every Other Day/30 Days ary Discharge Instructions: Apply to wound bed as instructed Secondary Dressing: ABD Pad,  5x9 Every Other Day/30 Days Discharge Instructions: Apply over primary dressing as directed. Secured With: Elastic Bandage 4 inch (ACE bandage) Every Other Day/30 Days Discharge Instructions: Secure with ACE bandage as directed. Com pression Wrap: Kerlix Roll 4.5x3.1 (in/yd) Every Other Day/30 Days LICET, DUNPHY ([id number]) [id number] Page 12 of 14 Discharge Instructions: Apply Kerlix and Coban compression as directed. WOUND #21: - Ankle Wound Laterality: Right, Medial Cleanser: Soap and Water Every Other Day/30 Days Discharge Instructions: May shower and wash wound with dial antibacterial soap and water prior to dressing change.  Cleanser: Wound Cleanser Every Other Day/30 Days Discharge Instructions: Cleanse the wound with wound cleanser prior to applying a clean dressing using gauze sponges, not tissue or cotton balls. Peri-Wound Care: Triamcinolone 15 (g) Every Other Day/30 Days Discharge Instructions: Use triamcinolone 15 (g) as directed Peri-Wound Care: Sween Lotion (Moisturizing lotion) Every Other Day/30 Days Discharge Instructions: Apply moisturizing lotion as directed Prim Dressing: ADAPTIC TOUCH 3x4.25 (in/in) Every Other Day/30 Days ary Discharge Instructions: Apply to wound bed as instructed Secondary Dressing: ABD Pad, 5x9 Every Other Day/30 Days Discharge Instructions: Apply over primary dressing as directed. Secured With: Elastic Bandage 4 inch (ACE bandage) Every Other Day/30 Days Discharge Instructions: Secure with ACE bandage as directed. Com pression Wrap: Kerlix Roll 4.5x3.1 (in/yd) Every Other Day/30 Days Discharge Instructions: Apply Kerlix and Coban compression as directed. 06/27/2022: The TheraSkin on the lateral wound remains almost completely intact. There is nice pink tissue peaking through the mesh of the TheraSkin. On the medial leg, it is starting to breakdown a little bit, but most of the TheraSkin remains intact here, as well.  Both wounds measured smaller today. I think we can probably get 1 more week out of this TheraSkin application, at least on the medial wound. It is starting to deteriorate and will probably need debridement at our visit next week. For now, continue liberal use of hydrogel with Adaptic and Kerlix/Coban wrapping. Electronic Signature(s) Signed: 06/27/2022 10:30:44 AM By: Fredirick Maudlin MD FACS Entered By: Fredirick Maudlin on 06/27/2022 10:30:44 -------------------------------------------------------------------------------- HxROS Details Patient Name: Date of Service: [name] [name], [name] 06/27/2022 10:15 A M Medical Record Number: [id number] Patient Account Number: [id number] Date of Birth/Sex: Treating RN: September 09, 1970 (52 y.o. F) Primary Care Provider: [name] Other Clinician: Referring Provider: Treating Provider/Extender: Elyse Jarvis Weeks in Treatment: 17 Information Obtained From Patient Constitutional Symptoms (General Health) Medical History: Past Medical History Notes: H/O miscarriage Eyes Medical History: Negative for: Cataracts; Glaucoma; Optic Neuritis Ear/Nose/Mouth/Throat Medical History: Negative for: Chronic sinus problems/congestion; Middle ear problems Hematologic/Lymphatic Medical History: Positive for: Anemia; Sickle Cell Disease Negative for: Hemophilia; Human Immunodeficiency Virus; Lymphedema Respiratory Medical History: Negative for: Aspiration; Asthma; Chronic Obstructive Pulmonary Disease (COPD); Pneumothorax; Sleep Apnea; Tuberculosis KASONDRA, JUNOD ([id number]) [id number] Page 13 of 14 Cardiovascular Medical History: Negative for: Angina; Arrhythmia; Congestive Heart Failure; Coronary Artery Disease; Deep Vein Thrombosis; Hypertension; Hypotension; Myocardial Infarction; Peripheral Arterial Disease; Peripheral Venous Disease; Phlebitis; Vasculitis Past Medical History  Notes: bradycardia Gastrointestinal Medical History: Negative for: Cirrhosis ; Colitis; Crohns; Hepatitis A; Hepatitis B; Hepatitis C Past Medical History Notes: cholilithiasis Endocrine Medical History: Negative for: Type I Diabetes; Type II Diabetes Genitourinary Medical History: Negative for: End Stage Renal Disease Immunological Medical History: Negative for: Lupus Erythematosus; Raynauds; Scleroderma Integumentary (Skin) Medical History: Negative for: History of Burn Musculoskeletal Medical History: Negative for: Gout; Rheumatoid Arthritis; Osteoarthritis; Osteomyelitis Neurologic Medical History: Positive for: Neuropathy - right foot intermittant Negative for: Dementia; Quadriplegia; Paraplegia; Seizure Disorder Oncologic Medical History: Negative for: Received Chemotherapy; Received Radiation Psychiatric Medical History: Negative for: Anorexia/bulimia; Confinement Anxiety Immunizations Pneumococcal Vaccine: Received Pneumococcal Vaccination: No Implantable Devices None Hospitalization / Surgery History Type of Hospitalization/Surgery c section x2 left breast lumpectomy iandD right ankle with theraskin Family and Social History Cancer: No; Diabetes: Yes - Mother; Heart Disease: No; Hereditary Spherocytosis: No; Hypertension: No; Kidney Disease: No; Lung Disease: Yes - Mother; Seizures: No; Stroke: No; Thyroid Problems: No; Tuberculosis: No; Never smoker; Marital Status - Married; Alcohol Use: Never; Drug Use: No History; Caffeine Use: Daily; Financial  Concerns: No; Food, Clothing or Shelter Needs: No; Support System Lacking: No; Transportation Concerns: No Electronic Signature(s) Signed: 06/27/2022 10:55:37 AM By: Fredirick Maudlin MD FACS [place], Legrand Rams ([id number]) AM By: Fredirick Maudlin MD FACS [phone].pdf Page 14 of 14 Signed: 06/27/2022 10:55:37 Entered By: Fredirick Maudlin on 06/27/2022  10:20:32 -------------------------------------------------------------------------------- SuperBill Details Patient Name: Date of Service: [name] [name], [place] [name] 06/27/2022 Medical Record Number: [id number] Patient Account Number: [id number] Date of Birth/Sex: Treating RN: 10-17-1970 (52 y.o. [name] Primary Care Provider: [name] Other Clinician: Referring Provider: Treating Provider/Extender: Elyse Jarvis Weeks in Treatment: 44 Diagnosis Coding ICD-10 Codes Code Description L97.818 Non-pressure chronic ulcer of other part of right lower leg with other specified severity 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 Facility Procedures : CPT4 Code: [id number] Description: 99213 - WOUND CARE VISIT-LEV 3 EST PT Modifier: Quantity: 1 Physician Procedures : CPT4 Code Description Modifier [id number] - WC PHYS LEVEL 3 - EST PT ICD-10 Diagnosis Description L97.818 Non-pressure chronic ulcer of other part of right lower leg with other specified severity 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 Quantity: 1 Electronic Signature(s) Signed: 06/27/2022 10:30:57 AM By: Fredirick Maudlin MD FACS Entered By: Fredirick Maudlin on 06/27/2022 10:30:57

## 2022-07-03 ENCOUNTER — Encounter (HOSPITAL_BASED_OUTPATIENT_CLINIC_OR_DEPARTMENT_OTHER): Payer: Medicaid Other | Admitting: General Surgery

## 2022-07-03 DIAGNOSIS — L97818 Non-pressure chronic ulcer of other part of right lower leg with other specified severity: Secondary | ICD-10-CM | POA: Diagnosis not present

## 2022-07-04 NOTE — Progress Notes (Signed)
[name], [name] ([id number].pdf Page 1 of 9 Visit Report for 07/03/2022 Arrival Information Details Patient Name: Date of Service: [name] [name] 07/03/2022 10:00 A M Medical Record Number: [id number] Patient Account Number: [id number] Date of Birth/Sex: Treating RN: 23-May-1970 (52 y.o. F) Primary Care Hilberto Burzynski: [name] Other Clinician: Referring Rolf Fells: Treating Norrin Shreffler/Extender: Elyse Jarvis Weeks in Treatment: 75 Visit Information History Since Last Visit All ordered tests and consults were completed: No Patient Arrived: Ambulatory Added or deleted any medications: No Arrival Time: 10:00 Any new allergies or adverse reactions: No Accompanied By: self Had a fall or experienced change in No Transfer Assistance: None activities of daily living that may affect Patient Identification Verified: Yes risk of falls: Secondary Verification Process Completed: Yes Signs or symptoms of abuse/neglect since last visito No Patient Requires Transmission-Based Precautions: No Hospitalized since last visit: No Patient Has Alerts: No Implantable device outside of the clinic excluding No cellular tissue based products placed in the center since last visit: Has Dressing in Place as Prescribed: Yes Pain Present Now: Yes Electronic Signature(s) Signed: 07/03/2022 4:48:58 PM By: Baruch Gouty RN, BSN Entered By: Baruch Gouty on 07/03/2022 10:13:11 -------------------------------------------------------------------------------- Encounter Discharge Information Details Patient Name: Date of Service: [name], [name] 07/03/2022 10:00 A M Medical Record Number: [id number] Patient Account Number: [id number] Date of Birth/Sex: Treating RN: 1970/08/03 (52 y.o. [name] Primary Care Genessis Flanary: [name] Other Clinician: Referring Gerilyn Stargell: Treating Apryl Brymer/Extender: Elyse Jarvis Weeks in  Treatment: 58 Encounter Discharge Information Items Post Procedure Vitals Discharge Condition: Stable Temperature (F): 98.2 Ambulatory Status: Cane Pulse (bpm): 86 Discharge Destination: Home Respiratory Rate (breaths/min): 18 Transportation: Private Auto Blood Pressure (mmHg): 146/86 Accompanied By: self Schedule Follow-up Appointment: Yes Clinical Summary of Care: Patient Declined Electronic Signature(s) Signed: 07/03/2022 4:48:58 PM By: Baruch Gouty RN, BSN Entered By: Baruch Gouty on 07/03/2022 10:55:58 [name] ([id number].pdf Page 2 of 9 -------------------------------------------------------------------------------- Lower Extremity Assessment Details Patient Name: Date of Service: [name] [name] 07/03/2022 10:00 A M Medical Record Number: [id number] Patient Account Number: [id number] Date of Birth/Sex: Treating RN: 1970-06-12 (52 y.o. [name] Primary Care Yianni Skilling: [name] Other Clinician: Referring Maci Eickholt: Treating Annalyssa Thune/Extender: Elyse Jarvis Weeks in Treatment: 63 Edema Assessment Assessed: [Left: No] [Right: No] Edema: [Left: Ye] [Right: s] Calf Left: Right: Point of Measurement: 33 cm From Medial Instep 31.5 cm Ankle Left: Right: Point of Measurement: 10 cm From Medial Instep 19.5 cm Vascular Assessment Pulses: Dorsalis Pedis Palpable: [Right:Yes] Electronic Signature(s) Signed: 07/03/2022 4:48:58 PM By: Baruch Gouty RN, BSN Entered By: Baruch Gouty on 07/03/2022 10:14:59 -------------------------------------------------------------------------------- Multi Wound Chart Details Patient Name: Date of Service: [name], [name] 07/03/2022 10:00 A M Medical Record Number: [id number] Patient Account Number: [id number] Date of Birth/Sex: Treating RN: [birth year] (52 y.o. F) Primary Care Teofilo Lupinacci: [name] Other Clinician: Referring Zeanna Sunde: Treating  Treylon Henard/Extender: Elyse Jarvis Weeks in Treatment: 6 Vital Signs Height(in): 67 Pulse(bpm): 86 Weight(lbs): 134 Blood Pressure(mmHg): 146/76 Body Mass Index(BMI): 21 Temperature(F): 98.2 Respiratory Rate(breaths/min): 20 [17:Photos:] [N/A:N/A [id number].pdf Page 3 of 9] Right, Lateral Lower Leg Right, Medial Ankle N/A Wound Location: Gradually Appeared Gradually Appeared N/A Wounding Event: Sickle Cell Lesion Sickle Cell Lesion N/A Primary Etiology: N/A Venous Leg Ulcer N/A Secondary Etiology: Anemia, Sickle Cell Disease, Anemia, Sickle Cell Disease, N/A Comorbid History: Neuropathy Neuropathy 10/05/2012 06/26/2021 N/A Date Acquired: 22 53 N/A Weeks of Treatment: Open Open N/A Wound Status: No No N/A  Wound Recurrence: Yes No N/A Clustered Wound: 6.3x5.2x0.1 7.5x3.3x0.1 N/A Measurements L x W x D (cm) 25.73 19.439 N/A A (cm) : rea 2.573 1.944 N/A Volume (cm) : 86.30% 32.90% N/A % Reduction in A rea: 86.30% 32.90% N/A % Reduction in Volume: Full Thickness Without Exposed Full Thickness Without Exposed N/A Classification: Support Structures Support Structures Medium Medium N/A Exudate A mount: Serosanguineous Serosanguineous N/A Exudate Type: red, brown red, brown N/A Exudate Color: Distinct, outline attached Distinct, outline attached N/A Wound Margin: Medium (34-66%) Medium (34-66%) N/A Granulation A mount: Pink Pink N/A Granulation Quality: Medium (34-66%) Medium (34-66%) N/A Necrotic A mount: Fat Layer (Subcutaneous Tissue): Yes Fat Layer (Subcutaneous Tissue): Yes N/A Exposed Structures: Fascia: No Fascia: No Tendon: No Tendon: No Muscle: No Muscle: No Joint: No Joint: No Bone: No Bone: No Small (1-33%) Small (1-33%) N/A Epithelialization: Debridement - Selective/Open Wound Debridement - Selective/Open Wound N/A Debridement: Pre-procedure Verification/Time Out 10:30 10:30  N/A Taken: Necrotic/Eschar Necrotic/Eschar, Other N/A Tissue Debrided: Non-Viable Tissue Non-Viable Tissue N/A Level: 6.3 7.5 N/A Debridement A (sq cm): rea Forceps, Scissors Curette, Forceps, Scissors N/A Instrument: Minimum Minimum N/A Bleeding: Pressure Pressure N/A Hemostasis A chieved: 0 0 N/A Procedural Pain: 0 0 N/A Post Procedural Pain: Procedure was tolerated well Procedure was tolerated well N/A Debridement Treatment Response: 6.3x5.2x0.1 7.5x3.3x0.1 N/A Post Debridement Measurements L x W x D (cm) 2.573 1.944 N/A Post Debridement Volume: (cm) Scarring: Yes Scarring: Yes N/A Periwound Skin Texture: No Abnormalities Noted No Abnormalities Noted N/A Periwound Skin Moisture: No Abnormalities Noted No Abnormalities Noted N/A Periwound Skin Color: No Abnormality No Abnormality N/A Temperature: N/A Yes N/A Tenderness on Palpation: Debridement Debridement N/A Procedures Performed: Treatment Notes Electronic Signature(s) Signed: 07/03/2022 10:43:43 AM By: Fredirick Maudlin MD FACS Entered By: Fredirick Maudlin on 07/03/2022 10:43:43 -------------------------------------------------------------------------------- Multi-Disciplinary Care Plan Details Patient Name: Date of Service: [name], [name] 07/03/2022 10:00 A M Medical Record Number: [id number] Patient Account Number: [id number] Date of Birth/Sex: Treating RN: 30-Dec-1970 (52 y.o. [name] Primary Care Lavin Petteway: [name] Other Clinician: Referring Jyrah Blye: Treating Dealva Lafoy/Extender: Elyse Jarvis Weeks in Treatment: [address] [place] reviewed with physician REMIYAH, DUEY ([id number]) [id number] Page 4 of 9 Active Inactive Venous Leg Ulcer Nursing Diagnoses: Actual venous Insuffiency (use after diagnosis is confirmed) Knowledge deficit related to disease process and management Goals: Patient will maintain optimal edema  control Date Initiated: 06/26/2021 Target Resolution Date: 08/15/2022 Goal Status: Active Interventions: Assess peripheral edema status every visit. Compression as ordered Treatment Activities: Therapeutic compression applied : 06/26/2021 Notes: Wound/Skin Impairment Nursing Diagnoses: Impaired tissue integrity Goals: Patient/caregiver will verbalize understanding of skin care regimen Date Initiated: 04/17/2021 Target Resolution Date: 08/15/2022 Goal Status: Active Ulcer/skin breakdown will have a volume reduction of 30% by week 4 Date Initiated: 04/17/2021 Date Inactivated: 05/29/2021 Target Resolution Date: 05/15/2021 Goal Status: Met Ulcer/skin breakdown will have a volume reduction of 50% by week 8 Date Initiated: 05/29/2021 Date Inactivated: 06/26/2021 Target Resolution Date: 06/26/2021 Goal Status: Unmet Unmet Reason: venous reflux Interventions: Assess patient/caregiver ability to obtain necessary supplies Assess patient/caregiver ability to perform ulcer/skin care regimen upon admission and as needed Assess ulceration(s) every visit Provide education on ulcer and skin care Treatment Activities: Topical wound management initiated : 04/17/2021 Notes: 06/08/21: Left leg wounds greater than 30% volume reduction, right leg acute infection. Electronic Signature(s) Signed: 07/03/2022 4:48:58 PM By: Baruch Gouty RN, BSN Entered By: Baruch Gouty on 07/03/2022 10:26:15 -------------------------------------------------------------------------------- Pain Assessment Details Patient Name: Date of Service: [name], [name] 07/03/2022 10:00 A M Medical Record Number: [id number] Patient Account Number: [id number] Date of Birth/Sex: Treating RN: 12-22-1970 (52 y.o. F) Primary Care Josey Forcier: [name] Other Clinician: Referring Imre Vecchione: Treating Josiah Nieto/Extender: Elyse Jarvis Weeks in Treatment: 19 Active Problems Location of Pain Severity and  Description of Pain Patient Has Paino Yes Site Locations [name], [name] ([id number]) [id number] Page 5 of 9 Site Locations Pain Location: Pain in Ulcers With Dressing Change: Yes Duration of the Pain. Constant / Intermittento Intermittent Rate the pain. Current Pain Level: 0 Worst Pain Level: 5 Least Pain Level: 0 Character of Pain Describe the Pain: Sharp Pain Management and Medication Current Pain Management: Medication: Yes Is the Current Pain Management Adequate: Adequate How does your wound impact your activities of daily livingo Sleep: No Bathing: No Appetite: No Relationship With Others: No Bladder Continence: No Emotions: No Bowel Continence: No Work: No Toileting: No Drive: No Dressing: No Hobbies: No Electronic Signature(s) Signed: 07/03/2022 4:48:58 PM By: Baruch Gouty RN, BSN Entered By: Baruch Gouty on 07/03/2022 10:14:11 -------------------------------------------------------------------------------- Patient/Caregiver Education Details Patient Name: Date of Service: [name], [name] 2/14/2024andnbsp10:00 A M Medical Record Number: [id number] Patient Account Number: [id number] Date of Birth/Gender: Treating RN: [birth year] (52 y.o. [name] Primary Care Physician: [name] Other Clinician: Referring Physician: Treating Physician/Extender: Elyse Jarvis Weeks in Treatment: 69 Education Assessment Education Provided To: Patient Education Topics Provided Venous: Methods: Explain/Verbal Responses: Reinforcements needed, State content correctly Wound/Skin Impairment: Methods: Explain/Verbal Responses: Reinforcements needed, State content correctly Electronic Signature(s) Signed: 07/03/2022 4:48:58 PM By: Baruch Gouty RN, BSN [place], [name] ([id number]) PM By: Baruch Gouty RN, BSN [phone].pdf Page 6 of 9 Signed: 07/03/2022 4:48:58 Entered By: Baruch Gouty on 07/03/2022 10:26:37 -------------------------------------------------------------------------------- Wound Assessment Details Patient Name: Date of Service: [name] [name] 07/03/2022 10:00 A M Medical Record Number: [id number] Patient Account Number: [id number] Date of Birth/Sex: Treating RN: 1971/01/18 (52 y.o. [name] Primary Care Adelheid Hoggard: [name] Other Clinician: Referring Carry Weesner: Treating Xavi Tomasik/Extender: Elyse Jarvis Weeks in Treatment: 40 Wound Status Wound Number: 17 Primary Etiology: Sickle Cell Lesion Wound Location: Right, Lateral Lower Leg Wound Status: Open Wounding Event: Gradually Appeared Comorbid History: Anemia, Sickle Cell Disease, Neuropathy Date Acquired: 10/05/2012 Weeks Of Treatment: 63 Clustered Wound: Yes Photos Wound Measurements Length: (cm) 6.3 Width: (cm) 5.2 Depth: (cm) 0.1 Area: (cm) 25.73 Volume: (cm) 2.573 % Reduction in Area: 86.3% % Reduction in Volume: 86.3% Epithelialization: Small (1-33%) Tunneling: No Undermining: No Wound Description Classification: Full Thickness Without Exposed Support Structures Wound Margin: Distinct, outline attached Exudate Amount: Medium Exudate Type: Serosanguineous Exudate Color: red, brown Foul Odor After Cleansing: No Slough/Fibrino Yes Wound Bed Granulation Amount: Medium (34-66%) Exposed Structure Granulation Quality: Pink Fascia Exposed: No Necrotic Amount: Medium (34-66%) Fat Layer (Subcutaneous Tissue) Exposed: Yes Necrotic Quality: Adherent Slough Tendon Exposed: No Muscle Exposed: No Joint Exposed: No Bone Exposed: No Periwound Skin Texture Texture Color No Abnormalities Noted: No No Abnormalities Noted: Yes Scarring: Yes Temperature / Pain Temperature: No Abnormality Moisture No Abnormalities Noted: Yes Treatment Notes [name], [name] ([id number].pdf Page 7 of 9 Wound #17 (Lower Leg) Wound  Laterality: Right, Lateral Cleanser Soap and Water Discharge Instruction: May shower and wash wound with dial antibacterial soap and water prior to dressing change. Wound Cleanser Discharge Instruction: Cleanse the wound with wound cleanser prior to applying a clean dressing using gauze sponges, not tissue or cotton balls. Peri-Wound Care Triamcinolone 15 (g) Discharge Instruction:  Use triamcinolone 15 (g) as directed Sween Lotion (Moisturizing lotion) Discharge Instruction: Apply moisturizing lotion as directed Topical Skintegrity Hydrogel 4 (oz) Discharge Instruction: Apply hydrogel as directed Primary Dressing ADAPTIC TOUCH 3x4.25 (in/in) Discharge Instruction: Apply to wound bed as instructed Secondary Dressing ABD Pad, 5x9 Discharge Instruction: Apply over primary dressing as directed. Secured With Elastic Bandage 4 inch (ACE bandage) Discharge Instruction: Secure with ACE bandage as directed. Compression Wrap Kerlix Roll 4.5x3.1 (in/yd) Discharge Instruction: Apply Kerlix and Coban compression as directed. Compression Stockings Add-Ons Electronic Signature(s) Signed: 07/03/2022 4:48:58 PM By: Baruch Gouty RN, BSN Entered By: Baruch Gouty on 07/03/2022 10:24:24 -------------------------------------------------------------------------------- Wound Assessment Details Patient Name: Date of Service: [name], [name] 07/03/2022 10:00 A M Medical Record Number: [id number] Patient Account Number: [id number] Date of Birth/Sex: Treating RN: [birth year] (52 y.o. [name] Primary Care Marbeth Smedley: [name] Other Clinician: Referring Neal Trulson: Treating Takeira Yanes/Extender: Elyse Jarvis Weeks in Treatment: 6 Wound Status Wound Number: 21 Primary Etiology: Sickle Cell Lesion Wound Location: Right, Medial Ankle Secondary Etiology: Venous Leg Ulcer Wounding Event: Gradually Appeared Wound Status: Open Date Acquired: 06/26/2021 Comorbid  History: Anemia, Sickle Cell Disease, Neuropathy Weeks Of Treatment: 53 Clustered Wound: No Photos [place], [name] ([id number]) [phone].pdf Page 8 of 9 Wound Measurements Length: (cm) 7.5 Width: (cm) 3.3 Depth: (cm) 0.1 Area: (cm) 19.439 Volume: (cm) 1.944 % Reduction in Area: 32.9% % Reduction in Volume: 32.9% Epithelialization: Small (1-33%) Tunneling: No Undermining: No Wound Description Classification: Full Thickness Without Exposed Support Structures Wound Margin: Distinct, outline attached Exudate Amount: Medium Exudate Type: Serosanguineous Exudate Color: red, brown Foul Odor After Cleansing: No Slough/Fibrino Yes Wound Bed Granulation Amount: Medium (34-66%) Exposed Structure Granulation Quality: Pink Fascia Exposed: No Necrotic Amount: Medium (34-66%) Fat Layer (Subcutaneous Tissue) Exposed: Yes Necrotic Quality: Adherent Slough Tendon Exposed: No Muscle Exposed: No Joint Exposed: No Bone Exposed: No Periwound Skin Texture Texture Color No Abnormalities Noted: No No Abnormalities Noted: Yes Scarring: Yes Temperature / Pain Temperature: No Abnormality Moisture No Abnormalities Noted: Yes Tenderness on Palpation: Yes Treatment Notes Wound #21 (Ankle) Wound Laterality: Right, Medial Cleanser Soap and Water Discharge Instruction: May shower and wash wound with dial antibacterial soap and water prior to dressing change. Wound Cleanser Discharge Instruction: Cleanse the wound with wound cleanser prior to applying a clean dressing using gauze sponges, not tissue or cotton balls. Peri-Wound Care Triamcinolone 15 (g) Discharge Instruction: Use triamcinolone 15 (g) as directed Sween Lotion (Moisturizing lotion) Discharge Instruction: Apply moisturizing lotion as directed Topical Primary Dressing ADAPTIC TOUCH 3x4.25 (in/in) Discharge Instruction: Apply to wound bed as instructed Secondary Dressing ABD Pad, 5x9 Discharge  Instruction: Apply over primary dressing as directed. Secured With Elastic Bandage 4 inch (ACE bandage) Discharge Instruction: Secure with ACE bandage as directed. [name], [name] ([id number].pdf Page 9 of 9 Compression Wrap Kerlix Roll 4.5x3.1 (in/yd) Discharge Instruction: Apply Kerlix and Coban compression as directed. Compression Stockings Add-Ons Electronic Signature(s) Signed: 07/03/2022 4:48:58 PM By: Baruch Gouty RN, BSN Entered By: Baruch Gouty on 07/03/2022 10:25:42 -------------------------------------------------------------------------------- [place] Details Patient Name: Date of Service: [name], [name] 07/03/2022 10:00 A M Medical Record Number: [id number] Patient Account Number: [id number] Date of Birth/Sex: Treating RN: Nov 28, 1970 (52 y.o. F) Primary Care Rogers Ditter: [name] Other Clinician: Referring Kierstyn Baranowski: Treating Valdez Brannan/Extender: Elyse Jarvis Weeks in Treatment: 10 Vital Signs Time Taken: 10:01 Temperature (F): 98.2 Height (in): 67 Pulse (bpm): 86 Weight (lbs): 134 Respiratory Rate (breaths/min): 20 Body Mass Index (BMI): 21 Blood Pressure (  mmHg): 146/76 Reference Range: 80 - 120 mg / dl Electronic Signature(s) Signed: 07/03/2022 4:48:58 PM By: Baruch Gouty RN, BSN Entered By: Baruch Gouty on 07/03/2022 10:13:20

## 2022-07-04 NOTE — Progress Notes (Signed)
[name], LEACOCK ([id number] Page 1 of 16 Visit Report for 07/03/2022 Chief Complaint Document Details Patient Name: Date of Service: [name] 07/03/2022 10:00 A M Medical Record Number: [id number] Patient Account Number: [id number] Date of Birth/Sex: Treating RN: 06/20/1970 (52 y.o. F) Primary Care Provider: [name] Other Clinician: Referring Provider: Treating Provider/Extender: Elyse Jarvis Weeks in Treatment: 60 Information Obtained from: Patient Chief Complaint the patient is here for evaluation of her bilateral lower extremity sickle cell ulcers 04/17/2021; patient comes in for substantial wounds on the right and left lower leg Electronic Signature(s) Signed: 07/03/2022 10:43:51 AM By: Fredirick Maudlin MD FACS Entered By: Fredirick Maudlin on 07/03/2022 10:43:51 -------------------------------------------------------------------------------- Debridement Details Patient Name: Date of Service: [name] [name], [name] 07/03/2022 10:00 A M Medical Record Number: [id number] Patient Account Number: [id number] Date of Birth/Sex: Treating RN: [birth year] (52 y.o. [name] Primary Care Provider: [name] Other Clinician: Referring Provider: Treating Provider/Extender: Elyse Jarvis Weeks in Treatment: 63 Debridement Performed for Assessment: Wound #17 Right,Lateral Lower Leg Performed By: Physician Fredirick Maudlin, MD Debridement Type: Debridement Level of Consciousness (Pre-procedure): Awake and Alert Pre-procedure Verification/Time Out Yes - 10:30 Taken: Start Time: 10:32 T Area Debrided (L x W): otal 6.3 (cm) x 1 (cm) = 6.3 (cm) Tissue and other material debrided: Non-Viable, Eschar, Fibrin/Exudate Level: Non-Viable Tissue Debridement Description: Selective/Open Wound Instrument: Forceps, Scissors Bleeding: Minimum Hemostasis Achieved: Pressure Procedural Pain: 0 Post  Procedural Pain: 0 Response to Treatment: Procedure was tolerated well Level of Consciousness (Post- Awake and Alert procedure): Post Debridement Measurements of Total Wound Length: (cm) 6.3 Width: (cm) 5.2 Depth: (cm) 0.1 Volume: (cm) 2.573 Character of Wound/Ulcer Post Debridement: Improved Post Procedure Diagnosis [name] ([id number] Page 2 of 16 Same as Pre-procedure Notes Scribed for Dr. Celine Ahr by Baruch Gouty, RN Electronic Signature(s) Signed: 07/03/2022 10:47:57 AM By: Fredirick Maudlin MD FACS Signed: 07/03/2022 4:48:58 PM By: Baruch Gouty RN, BSN Entered By: Baruch Gouty on 07/03/2022 10:34:01 -------------------------------------------------------------------------------- Debridement Details Patient Name: Date of Service: [name] [name], [name] 07/03/2022 10:00 A M Medical Record Number: [id number] Patient Account Number: [id number] Date of Birth/Sex: Treating RN: 12/25/1970 (52 y.o. Martyn Malay, [name] Primary Care Provider: [name] Other Clinician: Referring Provider: Treating Provider/Extender: Elyse Jarvis Weeks in Treatment: 63 Debridement Performed for Assessment: Wound #21 Right,Medial Ankle Performed By: Physician Fredirick Maudlin, MD Debridement Type: Debridement Severity of Tissue Pre Debridement: Fat layer exposed Level of Consciousness (Pre-procedure): Awake and Alert Pre-procedure Verification/Time Out Yes - 10:30 Taken: Start Time: 10:32 T Area Debrided (L x W): otal 7.5 (cm) x 1 (cm) = 7.5 (cm) Tissue and other material debrided: Non-Viable, Eschar, Fibrin/Exudate, Other: thereskin Level: Non-Viable Tissue Debridement Description: Selective/Open Wound Instrument: Curette, Forceps, Scissors Bleeding: Minimum Hemostasis Achieved: Pressure Procedural Pain: 0 Post Procedural Pain: 0 Response to Treatment: Procedure was tolerated well Level of Consciousness (Post- Awake  and Alert procedure): Post Debridement Measurements of Total Wound Length: (cm) 7.5 Width: (cm) 3.3 Depth: (cm) 0.1 Volume: (cm) 1.944 Character of Wound/Ulcer Post Debridement: Improved Severity of Tissue Post Debridement: Fat layer exposed Post Procedure Diagnosis Same as Pre-procedure Notes Scribed for Dr. Celine Ahr by Baruch Gouty, RN Electronic Signature(s) Signed: 07/03/2022 10:47:57 AM By: Fredirick Maudlin MD FACS Signed: 07/03/2022 4:48:58 PM By: Baruch Gouty RN, BSN Entered By: Baruch Gouty on 07/03/2022 10:39:09 [name] ([id number] Page 3 of 16 -------------------------------------------------------------------------------- HPI Details Patient Name: Date of Service: [name], [place] [name] 07/03/2022  10:00 A M Medical Record Number: [id number] Patient Account Number: [id number] Date of Birth/Sex: Treating RN: 1970/06/25 (52 y.o. F) Primary Care Provider: [name] Other Clinician: Referring Provider: Treating Provider/Extender: Elyse Jarvis Weeks in Treatment: 33 History of Present Illness Location: medial and lateral ankle region on the right and left medial malleolus Quality: Patient reports experiencing a shooting pain to affected area(s). Severity: Patient states wound(s) are getting worse. Duration: right lower extremity bimalleolar ulcers have been present for approximately 2 years; the rright meedial malleolus ulcer has been there proximally 6 months Timing: Pain in wound is constant (hurts all the time) Context: The wound would happen gradually ssociated Signs and Symptoms: Patient reports having increase discharge. A HPI Description: 52 year old patient with a history of sickle cell anemia who was last seen by me with ulceration of the right lower extremity above the ankle and was referred to Dr. Leland Johns for a surgical debridement as I was unable to do anything in the office due to  excruciating pain. At that stage she was referred from the plastic surgery service to dermatology who treated her for a skin infection with doxycycline and then Levaquin and a local antibiotic ointment. I understand the patient has since developed ulceration on the left ankle both medial and lateral and was now referred back to the wound center as dermatology has finished the management. I do not have any notes from the dermatology department Old notes: 52 year old patient with a history of sickle cell anemia, pain bilateral lower extremities, right lower extremity ulcer and has a history of receiving a skin graft( Theraskin) several months ago. She has been visiting the wound center [facility] and was seen by Dr. Dellia Nims and Dr. Leland Johns. after prolonged conservator therapy between July 2016 and January 2017. She had been seen by the plastic surgeon and taken to the OR for debridement and application of Theraskin. She had 3 applications of Theraskin and was then treated with collagen. Prior to that she had a history of similar problems in 2014 and was treated conservatively. Had a reflux study done for the right lower extremity in August 2016 without reflux or DVT . Past medical history significant for sickle cell disease, anemia, leg ulcers, cholelithiasis,and has never been a smoker. Once the patient was discharged on the wound center she says within 2 or 3 weeks the problems recurred and she has been treating it conservatively. since I saw her 3 weeks ago at [facility] she has been unable to get her dressing material but has completed a course of doxycycline. 6/7/ 2017 -- lower extremity venous duplex reflux evaluation was done No evidence of SVT or DVT in the RLL. No venous incompetence in the RLL. No further vascular workup is indicated at this time. She was seen by Dr. [name], on 10/04/2015. She agreed with the plan of taking her to the OR for debridement and application of theraskin  and would also take biopsies to rule out pyoderma gangrenosum. Follow-up note dated May 31 received and she was status post application of Theraskin to multiple ulcers around the right ankle. Pathology did not show evidence of malignancy or pyoderma gangrenosum. She would continue to see as in the wound clinic for further care and see Dr. Leland Johns as needed. The patient brought the biopsy report and it was consistent with stasis ulcer no evidence of malignancy and the comment was that there was some adjacent neovascularization, fibrosis and patchy perivascular chronic inflammation. 11/15/2015 -- today we applied her  first application of Theraskin 11/30/15; TheraSkin #2 12/13/2015 -- she is having a lot of pain locally and is here for possible application of a theraskin today. 01/16/2016 -- the patient has significant pain and has noticed despite in spite of all local care and oral pain medication. It is impossible to debride her in the office. 02/06/2016 -- I do not see any notes from Dr. Iran Planas( the patient has not made a call to the office know as she heard from them) and the only visit to recently was with her PCP Dr. Danella Penton -- I saw her on 01/16/2016 and prescribed 90 tablets of oxycodone 10 mg and did lab work and screening for HIV. the HIV was negative and hemoglobin was 6.3 with a WBC count of 14.9 and hematocrit of 17.8 with platelets of 561. 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 The bimalleolar ulcers to the right lower extremity have been present for approximately 2 years, the left medial malleolus ulcer has been present approximately 6 months. She has followed with Dr.Thimmappa in the past and has had a total of 3 applications of Theraskin (01/2015, 09/2015, 06/17/16). She has also followed with Dr. Con Memos here in the clinic and has received 2 applications of  TheraSkin (11/10/15, 11/30/15). The patient does experience chronic, and is not amenable to debridement. 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 She is not currently on any antibiotic therapy and has not been treated with any recently. 07/17/2016 -- was seen by Dr. Iran Planas of plastic surgery who saw her 2 weeks postop application of Theraskin #3. She had removed her dressing and asked her to apply silver alginate on alternate days and follow-up back with the wound center. Future debridements and application of skin substitute would have to be done in the hospital due to her high risk for anesthesia. READMISSION 04/17/2021 Patient is now a 52 year old woman that we have had in this clinic for a prolonged period of time and 2016-2017 and then again for 2 visits in February 2018. At that point she had wounds on the right lower leg predominantly medial. She had also been seen by plastic surgery Dr. Leland Johns who I believe took her to the OR for operative debridement and application of TheraSkin in 2017. After she left our clinic she was followed for a very prolonged period of time in the wound care center in [facility] who then referred her ultimately to [facility] where she was seen by Dr. Vernona Rieger. Again taken her to the OR for skin grafting which apparently did not take. She had multiple other attempts at dressings although I have not really looked over all of these notes in great detail. She has not been seen in a wound care center in about a year. She states over the last year in addition to her right lower leg she has developed wounds on the left lower leg quite extensive. She is using Xeroform to all of these wounds without really any improvement. She also has Medicaid which does not cover wound products. The patient has had vascular work-ups in the past including most recently on 03/28/2021 showing biphasic waveforms on the right triphasic at the PTA  and biphasic at the dorsalis pedis on the left. She was unable to tolerate any degree of compression to do ABIs. Unfortunately TBI's were also not done. She had venous reflux  studies done in 2017. This did not show any evidence of a DVT or SVT and no venous incompetence was noted in the right leg at the time this was the only side with the wound As noted I did not look all over her old records. She apparently had a course of HBO and [facility] although I am not sure what the indication would have been. In any case she developed seizures and terminated treatment earlier. She is generally much more disabled than when we last saw her in clinic. She can no longer walk pretty much wheelchair-bound because predominantly of pain in the left hip. 04/24/2021; the patient tolerated the wraps we put on. We used Santyl and Hydrofera Blue under compression. I brought her back for a nurse visit for a change in dressing. With Medicaid we will have a hard time getting anything paid for and hence the need for compression. She arrives in clinic with all the wounds looking somewhat better in terms of surface [place], Legrand Rams ([id number]) [id number] Page 4 of 16 12/20; circumferential wound on the right from the lateral to the medial. She has open areas on the left medial and left lateral x2 on all of this with the same surface. This does not look completely healthy although she does have some epithelialization. She is not complaining of a lot of pain which is unusual for her sickle ulcers. I have not looked over her extensive records from [facility]. She had recent arterial studies and has a history of venous reflux studies I will need to look these over although I do not believe she has significant arterial disease 2023 05/22/2021; patient's wound areas measure slightly smaller. Still a lot of drainage coming from the right we have been using Hydrofera Blue and Santyl with some improvement in the  wound surfaces. She tells me she will be getting transfused later in the week for her underlying sickle cell anemia I have looked over her recent arterial studies which were done in the fall. This was in November and showed biphasic and triphasic waveforms but she could not tolerate ABIs because of pressure and unfortunately TBI's were not done. She has not had recent venous reflux studies that I can see 1/10; not much change about the same surface area. This has a yellowish surface to it very gritty. We have been using Santyl and Hydrofera Blue for a prolonged period. Culture I did last week showed methicillin sensitive staph aureus "rare". Our intake nurse reports greenish drainage which may be the Hydrofera Blue itself 1/17; wounds are continue to measure smaller although I am not sure about the accuracy here. Especially the areas on the right are covered in what looks to be a nonviable surface although she does have some epithelialization. Similarly she has areas on the left medial and left lateral ankle area which appear to have a better surface and perhaps are slightly smaller. We have been using Santyl and Hydrofera Blue. She cannot tolerate mechanical debridements She went for her reflux studies which showed significant reflux at the greater saphenous vein at the saphenofemoral junction as well as the greater saphenous vein in the proximal calf on the left she had reflux in the thigh and the common femoral vein and supra vein Fishel vein reflux in the greater saphenous vein. I will have vein and vascular look at this. My thoughts have been that these are likely sickle wounds. I looked through her old records from [facility] wound care center and then when she  graduated to [facility] wound care center where she saw Dr. Zigmund Daniel and Dr. Vernona Rieger. Although I can see she had reflux studies done I do not see that she actually saw a vein and vascular. I went over the fact that she had operative  debridements and actual skin grafting that did not take. I do not think these wounds have ever really progressed towards healing 1/31;Substantial wounds on the right ankle area. Hyper granulated very gritty adherent debris on the surface. 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 On the left she has no DVT or SVT deep vein reflux in the common femoral vein. Superficial vein reflux in the greater saphenous vein on although the vein diameter is not really all that large. I do not think there is anything that can be done with these although I am going to send her for consultation to vein and vascular. 2/7; Wound exam; substantial wound area on the right posterior ankle area and areas on the left medial ankle and left lateral ankle. I was able to debride the left medial ankle last week fairly aggressively and it is back this week to a completely nonviable surface She will see vascular surgery this Friday and I would like them to review the venous studies and also any comments on her arterial status. If they do not see an issue here I am going to refer her to plastic surgery for an operative debridement perhaps intraoperative ACell or Integra. Eventually she will require a deep tissue culture again 2/14; substantial wound area on the right posterior ankle, medial ankle. We have been using silver alginate The patient was seen by vein and vascular she had both venous reflux studies and arterial studies. 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 She had no evidence of  superficial venous thrombosis. She did have superficial vein reflux at the saphenofemoral junction and the greater saphenous vein. On the left no evidence of a DVT no evidence of superficial venous throat thrombosis she did have deep vein reflux in the common femoral vein and superficial vein reflux in the greater saphenous vein but these were not felt to be amenable to ablation. In terms of arterial studies she had triphasic and wife biphasic wave waveforms bilaterally not felt to have a significant arterial issue. I do not get the feeling that they felt that any part of her nonhealing wounds were related to either arterial or venous issues. They did note that she had venous reflux at the right at the [facility] and [facility]. And also on the left there were reflux in the deep system at the common femoral vein and greater saphenous vein in the proximal thigh. Nothing amenable to ablation. 2/20; she is making some decent progress on the right where there is nice skin between the 2 open areas on the right ankle. The surfaces here do not look viable yet there is some surrounding epithelialization. She still has a small area on the left medial ankle area. Hyper-granulated [name] away always 2/28 patient has an appointment with  plastic surgery on 3/8. We will see her back on 3/9. She may have to call us to get the area redressed. We've been using Santyl under silver alginate. We made a nice improvement on the left medial ankle. The larger wounds on the right also looks somewhat better in terms of epithelialization although I think they could benefit from an aggressive debridement if plastic surgery would be willing to do that. Perhaps placement of Integra or a cell 07/26/2021: She saw Dr. Claudia Desanctis yesterday. He raised the question as to whether or not this might be pyoderma and wanted to wait until that question was answered by dermatology before proceeding with any sort of operative debridement. We have continue to use  Santyl under silver alginate with Kerlix and Coban wraps. Overall, her wounds appear to be continuing to contract and epithelialize, with some granulation tissue present. There continues to be some slough on all wound surfaces. 08/09/2021: She has not been able to get an appointment with dermatology because apparently the offices in [place] do not accept Medicaid. She is looking into whether or not she can be seen at the main [facility] dermatology clinic. This is necessary because plastic surgery is concerned that her wounds might represent pyoderma and they did not want to do any procedure until that was clarified. We have been using Santyl under silver alginate with Kerlix and Coban wraps. Today, there was a greater amount of drainage on her dressings with a slight green discoloration and significant odor. Despite this, her wounds continue to contract and epithelialize. There is pale granulation tissue present and actually, on the left medial ankle, the granulation tissue is a bit hypertrophic. 08/16/2021: Last week, I took a culture and this grew back rare methicillin-resistant Staph aureus and rare corynebacterium. The MRSA was sensitive to gentamicin which we began applying topically on an empiric basis. This week, her wounds are a bit smaller and the drainage and odor are less. Her primary care provider is working on assisting the patient with a dermatology evaluation. She has been in silver alginate over the gentamicin that was started last week along with Kerlix and Coban wraps. 08/23/2021: Because she has Medicaid, we have been unable to get her into see any dermatologist in the [place] to rule out pyoderma gangrenosum, which was a requirement from plastic surgery prior to any sort of debridement and grafting. Despite this, however, all of her wounds continue to get smaller. The wound on her left medial ankle is nearly closed. There is no odor from the wounds, although  she still accumulates a modest amount of drainage on her dressings. 08/30/2021: The lateral right ankle wound and the medial left ankle wound are a bit smaller today. The medial right ankle wound is about the same size. They are less tender. We have still been unable to get her into dermatology. 09/06/2021: All of the wounds are about the same size today. She continues to endorse minimal pain. I communicated with Dr. Claudia Desanctis in plastic surgery regarding our issues getting a dermatology appointment; he was out of town but indicated that he would look into perhaps performing the biopsy in his office and will have his office contact her. CALANI, STARACE ([id number] Page 5 of 16 09/14/2021: The patient has an appointment in dermatology, but it is not until October. Her wounds are roughly the same; she continues to have very thick purulent-looking drainage on her dressings. 09/20/2021: The left medial wound is nearly closed and just  has a bit of accumulated eschar on the surface. The right medial and lateral ankle wounds are perhaps a little bit smaller. They continue to have a very pale surface with accumulation of thin slough. 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 I did not think [employer] was indicated based on this. She is getting topical mupirocin with Prisma silver collagen. 10/04/2021: The patient was not seen in clinic last week due to childcare coverage issues. In the interim, the left medial leg wound has closed. The right sided leg wounds are smaller. There is more granulation tissue coming through, particularly on the lateral wound. The surface remains somewhat gritty. We have been applying topical mupirocin and Prisma silver collagen. 10/11/2021: The left medial leg wound remains closed. She does complain of some anesthetic sensation to the area. Both of the right-sided leg wounds are smaller but still have accumulated  slough. 10/18/2021: Both right-sided leg wounds are minimally smaller this week. She still continues to accumulate slough and has thick drainage on her dressings. 10/23/2021: Both wounds continue to contract. There is still slough buildup. She has been approved for a keratin-based skin substitute trial product but it will not be available until next week. 10/30/2021: The wounds are about the same to perhaps slightly smaller. There is still continued slough buildup. Unfortunately, the rep for the keratin based product did not show up today and did not answer his phone when called. 11/08/2021: The wounds are little bit smaller today. She continues to have thick drainage but the surfaces are relatively clean with just a little bit of slough accumulation. She reported to me today that she is unable to completely flex her left ankle and on examination it seems this is potentially related to scar tissue from her wounds. We do have the ProgenaMatrix trial product available for her today. 11/15/2021: Both wounds are smaller today. There is some slough accumulation on the surfaces, but the medial wound, in particular looks like it is filling in and is less deep. She did hear from physical therapy and she is going to start working with them on July 11. She is here for her second application of the trial skin substitute, ProgenaMatrix. 11/22/2021: Both wounds continue to contract, the medial more dramatically than the lateral. Both wounds have a layer of slough on the surface, but underneath this, the gritty fibrous tissue has a little bit more of a pink cast to it rather than being as pale as it has been. 11/29/2021: The wounds are roughly the same size this week, perhaps a millimeter or 2 smaller. The medial wound has filled in and is nearly flush with the surrounding skin surface. She continues to have a lot of slough accumulation on both surfaces. 12/06/2021: No significant change to her wounds, but she has a new  opening on her dorsal foot, just distal to the right lateral ankle wound. The area on her left medial ankle that reopened looks a little bit larger today. She has quite a bit of pain associated with the new wound. 12/12/2021: Her wounds look about the same but the new opening on her right lateral dorsal foot is a little bit bigger. She continues to have a fair amount of pain with this wound. 12/26/2021: The left medial ankle wound is tiny and superficial. She has 2 areas of crusting on her left lateral ankle, however, that appear to be threatening to open again. Her right medial ankle wound is a little bit smaller today  but still continues to accumulate thick rubbery slough. The new dorsal foot wound is exquisitely painful but there is no odor or purulent drainage. No erythema or induration. The right lateral ankle wound looks about the same today, again with thick rubbery slough. 01/03/2022: The left medial ankle wound has closed again. Both right ankle wounds appear to be about the same size with thick rubbery slough. The dorsal foot wound on the right continues to be quite painful and she stated that she did not want any debridement of that site today. 01/10/2022: No real change to any of her wounds. She continues to accumulate thick slough. The dorsal foot wound has merged with the lateral malleolar wound. She is experiencing significant pain in the dorsal foot portion of the ulcer. 01/16/2022: Absolutely no change or progress in her wounds. 01/24/2022: Her wounds are unchanged. She continues to build up slough and the wounds on her dorsal right foot are still exquisitely tender. 01/31/2022: The wounds actually measure a little bit narrower today. They still have thick slough on the surface but the underlying tissue seems a little less fibrotic. We changed to Iodosorb last week. 02/07/2022: Wounds continue to slowly epithelialize around the parameters. She has less pain today. She still accumulates a  fairly substantial layer of slough. 02/15/2022: No change to the wounds overall. The measured a little bit larger today per the intake nurse. She continues to have substantial slough accumulation and her pain is a little bit worse. 02/21/2022. No change at all to her wounds. I do not see that plastic surgery has received her referral yet. 02/28/2022: The wounds remain unchanged. They are dry and fibrotic with accumulation of slough and eschar. She did receive an appointment to see plastic surgery on October 23, but the office called her back and indicated they needed to reschedule and that the next available appointment was not until December. The patient became angry and decided she did not want to see this plastics group. 10/19; the patient sees Dr. Lovett Calender again next week. She is using Medihoney as the primary dressing changing this herself. 03/21/2022: She saw Dr. Jaclynn Guarneri at the wound care center at [facility]. Dr. Jaclynn Guarneri was also unable to debride her, secondary to pain and is planning to take her to the operating room for operative debridement and potential skin substitute placement. Her wounds are unchanged with thick yellow slough and a fibrotic base. She says they are too painful to be debrided. In addition, yesterday her hemoglobin was 4 and she received 2 units packed red blood cell transfusion. 04/04/2022: Her operation at [facility] is scheduled for December 15. She continues to use Medihoney on her wounds. They are a little bit less painful today and she is willing to entertain the possibility of debridement. The wounds measured a little bit larger in all dimensions today but the layer of slough is not as thick as usual. 04/18/2022: Her wounds actually measured a little bit smaller today and the extension onto the dorsal foot from the lateral wound has healed. They are less tender and there is significantly less slough present as compared to prior  visits. 05/09/2022: She had to reschedule her surgery due to lack of childcare. It is now planned for January 5. The wounds are basically unchanged, but she had a lot more drainage, which was thick and somewhat purulent in appearance. No significant odor. 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 They are quite painful today and she does not  want to have debridement performed. 05/30/2022: The patient underwent surgical debridement and placement of TheraSkin to her wounds on January 5. She has been doing well and does not endorse any pain. The TheraSkin is intact and looks beautiful. It is sutured in place. There is no exudate or significant drainage. AMARRIE, HULETTE ([id number] Page 6 of 16 06/04/2022: Her TheraSkin remains intact and I am starting to see granulation tissue emerging underneath the surface. No concern for infection. 06/12/2022: The TheraSkin is intact and there is more granulation tissue emerging. It is starting to look a little bit dry, however. No malodor or purulent drainage. 06/19/2022: The TheraSkin continues to look good. The edges are a bit dry, but the central portion of each of her wounds is showing a nice pink color. 06/27/2022: The TheraSkin on the lateral wound remains almost completely intact. There is nice pink tissue peaking through the mesh of the TheraSkin. On the medial leg, it is starting to breakdown a little bit, but most of the TheraSkin remains intact here, as well. Both wounds measured smaller today. 07/03/2022: The wounds are fairly dry around the perimeter today. Some of the suture is hanging loose. Electronic Signature(s) Signed: 07/03/2022 10:44:34 AM By: Fredirick Maudlin MD FACS Entered By: Fredirick Maudlin on 07/03/2022 10:44:34 -------------------------------------------------------------------------------- Physical Exam Details Patient Name: Date of Service: [name] [name], [name] 07/03/2022 10:00 A M Medical  Record Number: [id number] Patient Account Number: [id number] Date of Birth/Sex: Treating RN: 05-03-1971 (52 y.o. F) Primary Care Provider: [name] Other Clinician: Referring Provider: Treating Provider/Extender: Elyse Jarvis Weeks in Treatment: 60 Constitutional Slightly hypertensive. . . . no acute distress. Respiratory Normal work of breathing on room air. Notes 07/03/2022: The wounds are fairly dry around the perimeter today. Some of the suture is hanging loose. Electronic Signature(s) Signed: 07/03/2022 10:45:29 AM By: Fredirick Maudlin MD FACS Entered By: Fredirick Maudlin on 07/03/2022 10:45:28 -------------------------------------------------------------------------------- Physician Orders Details Patient Name: Date of Service: [name] [name], [name] 07/03/2022 10:00 A M Medical Record Number: [id number] Patient Account Number: [id number] Date of Birth/Sex: Treating RN: March 01, 1971 (52 y.o. [name] Primary Care Provider: [name] Other Clinician: Referring Provider: Treating Provider/Extender: Elyse Jarvis Weeks in Treatment: 39 Verbal / Phone Orders: No Diagnosis Coding ICD-10 Coding Code Description L97.818 Non-pressure chronic ulcer of other part of right lower leg with other specified severity 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 Follow-up Appointments ppointment in 1 week. - Dr. Celine Ahr - room 1 Return A Friday 2/23 @ 09:00 am [name] ([id number] Page 7 of 16 Cellular or Tissue Based Products Other Cellular or Tissue Based Products Orders/Instructions: - Theraskin applied by Psychiatric nurse, leave on until further notice Bathing/ Shower/ Hygiene Do not shower or bathe in tub. Edema Control - Lymphedema / SCD / Other Avoid standing for long periods of time. Exercise regularly Additional Orders / Instructions Follow Nutritious Diet Wound Treatment Wound  #17 - Lower Leg Wound Laterality: Right, Lateral Cleanser: Soap and Water Every Other Day/30 Days Discharge Instructions: May shower and wash wound with dial antibacterial soap and water prior to dressing change. Cleanser: Wound Cleanser Every Other Day/30 Days Discharge Instructions: Cleanse the wound with wound cleanser prior to applying a clean dressing using gauze sponges, not tissue or cotton balls. Peri-Wound Care: Triamcinolone 15 (g) Every Other Day/30 Days Discharge Instructions: Use triamcinolone 15 (g) as directed Peri-Wound Care: Sween Lotion (Moisturizing lotion) Every Other Day/30 Days Discharge Instructions: Apply moisturizing lotion as directed Topical:  Skintegrity Hydrogel 4 (oz) Every Other Day/30 Days Discharge Instructions: Apply hydrogel as directed Prim Dressing: [place] 3x4.25 (in/in) Every Other Day/30 Days ary Discharge Instructions: Apply to wound bed as instructed Secondary Dressing: ABD Pad, 5x9 Every Other Day/30 Days Discharge Instructions: Apply over primary dressing as directed. Secured With: Elastic Bandage 4 inch (ACE bandage) Every Other Day/30 Days Discharge Instructions: Secure with ACE bandage as directed. Compression Wrap: Kerlix Roll 4.5x3.1 (in/yd) Every Other Day/30 Days Discharge Instructions: Apply Kerlix and Coban compression as directed. Wound #21 - Ankle Wound Laterality: Right, Medial Cleanser: Soap and Water Every Other Day/30 Days Discharge Instructions: May shower and wash wound with dial antibacterial soap and water prior to dressing change. Cleanser: Wound Cleanser Every Other Day/30 Days Discharge Instructions: Cleanse the wound with wound cleanser prior to applying a clean dressing using gauze sponges, not tissue or cotton balls. Peri-Wound Care: Triamcinolone 15 (g) Every Other Day/30 Days Discharge Instructions: Use triamcinolone 15 (g) as directed Peri-Wound Care: Sween Lotion (Moisturizing lotion) Every Other Day/30  Days Discharge Instructions: Apply moisturizing lotion as directed Prim Dressing: ADAPTIC TOUCH 3x4.25 (in/in) Every Other Day/30 Days ary Discharge Instructions: Apply to wound bed as instructed Secondary Dressing: ABD Pad, 5x9 Every Other Day/30 Days Discharge Instructions: Apply over primary dressing as directed. Secured With: Elastic Bandage 4 inch (ACE bandage) Every Other Day/30 Days Discharge Instructions: Secure with ACE bandage as directed. Compression Wrap: Kerlix Roll 4.5x3.1 (in/yd) Every Other Day/30 Days Discharge Instructions: Apply Kerlix and Coban compression as directed. Electronic Signature(s) Signed: 07/03/2022 10:47:57 AM By: Fredirick Maudlin MD FACS Entered By: Fredirick Maudlin on 07/03/2022 10:45:39 [name] ([id number] Page 8 of 16 -------------------------------------------------------------------------------- Problem List Details Patient Name: Date of Service: [name], Bude [name] 07/03/2022 10:00 A M Medical Record Number: [id number] Patient Account Number: [id number] Date of Birth/Sex: Treating RN: 04/08/1971 (52 y.o. [name] Primary Care Provider: [name] Other Clinician: Referring Provider: Treating Provider/Extender: Elyse Jarvis Weeks in Treatment: 38 Active Problems ICD-10 Encounter Code Description Active Date MDM Diagnosis L97.818 Non-pressure chronic ulcer of other part of right lower leg with other specified 04/17/2021 No Yes severity 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 04/17/2021 No Yes Inactive Problems ICD-10 Code Description Active Date Inactive Date L97.828 Non-pressure chronic ulcer of other part of left lower leg with other specified severity 04/17/2021 04/17/2021 Resolved Problems Electronic Signature(s) Signed: 07/03/2022 10:43:02 AM By: Fredirick Maudlin MD FACS Entered By: Fredirick Maudlin on 07/03/2022  10:43:02 -------------------------------------------------------------------------------- Progress Note Details Patient Name: Date of Service: [name] [name], [name] 07/03/2022 10:00 A M Medical Record Number: [id number] Patient Account Number: [id number] Date of Birth/Sex: Treating RN: 23-Sep-1970 (52 y.o. F) Primary Care Provider: [name] Other Clinician: Referring Provider: Treating Provider/Extender: Elyse Jarvis Weeks in Treatment: 39 Subjective Chief Complaint Information obtained from Patient the patient is here for evaluation of her bilateral lower extremity sickle cell ulcers 04/17/2021; patient comes in for substantial wounds on the right and left lower leg History of Present Illness (HPI) The following HPI elements were documented for the patient's wound: Location: medial and lateral ankle region on the right and left medial malleolus Quality: Patient reports experiencing a shooting pain to affected area(s). Severity: Patient states wound(s) are getting worse. Duration: right lower extremity bimalleolar ulcers have been present for approximately 2 years; the rright meedial malleolus ulcer has been there proximally 6 Guile, Rainah ([id number] Page 9 of 16 months Timing: Pain in wound is constant (hurts all  the time) Context: The wound would happen gradually Associated Signs and Symptoms: Patient reports having increase discharge. 52 year old patient with a history of sickle cell anemia who was last seen by me with ulceration of the right lower extremity above the ankle and was referred to Dr. Leland Johns for a surgical debridement as I was unable to do anything in the office due to excruciating pain. At that stage she was referred from the plastic surgery service to dermatology who treated her for a skin infection with doxycycline and then Levaquin and a local antibiotic ointment. I understand the patient has since  developed ulceration on the left ankle both medial and lateral and was now referred back to the wound center as dermatology has finished the management. I do not have any notes from the dermatology department Old notes: 52 year old patient with a history of sickle cell anemia, pain bilateral lower extremities, right lower extremity ulcer and has a history of receiving a skin graft( Theraskin) several months ago. She has been visiting the wound center [facility] and was seen by Dr. Dellia Nims and Dr. Leland Johns. after prolonged conservator therapy between July 2016 and January 2017. She had been seen by the plastic surgeon and taken to the OR for debridement and application of Theraskin. She had 3 applications of Theraskin and was then treated with collagen. Prior to that she had a history of similar problems in 2014 and was treated conservatively. Had a reflux study done for the right lower extremity in August 2016 without reflux or DVT . Past medical history significant for sickle cell disease, anemia, leg ulcers, cholelithiasis,and has never been a smoker. Once the patient was discharged on the wound center she says within 2 or 3 weeks the problems recurred and she has been treating it conservatively. since I saw her 3 weeks ago at [facility] she has been unable to get her dressing material but has completed a course of doxycycline. 6/7/ 2017 -- lower extremity venous duplex reflux evaluation was done oo No evidence of SVT or DVT in the RLL. No venous incompetence in the RLL. No further vascular workup is indicated at this time. She was seen by Dr. [name], on 10/04/2015. She agreed with the plan of taking her to the OR for debridement and application of theraskin and would also take biopsies to rule out pyoderma gangrenosum. Follow-up note dated May 31 received and she was status post application of Theraskin to multiple ulcers around the right ankle. Pathology did not show evidence of  malignancy or pyoderma gangrenosum. She would continue to see as in the wound clinic for further care and see Dr. Leland Johns as needed. The patient brought the biopsy report and it was consistent with stasis ulcer no evidence of malignancy and the comment was that there was some adjacent neovascularization, fibrosis and patchy perivascular chronic inflammation. 11/15/2015 -- today we applied her first application of Theraskin 11/30/15; TheraSkin #2 12/13/2015 -- she is having a lot of pain locally and is here for possible application of a theraskin today. 01/16/2016 -- the patient has significant pain and has noticed despite in spite of all local care and oral pain medication. It is impossible to debride her in the office. 02/06/2016 -- I do not see any notes from Dr. Iran Planas( the patient has not made a call to the office know as she heard from them) and the only visit to recently was with her PCP Dr. Danella Penton -- I saw her on 01/16/2016 and prescribed 90 tablets  of oxycodone 10 mg and did lab work and screening for HIV. the HIV was negative and hemoglobin was 6.3 with a WBC count of 14.9 and hematocrit of 17.8 with platelets of 561. 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 The bimalleolar ulcers to the right lower extremity have been present for approximately 2 years, the left medial malleolus ulcer has been present approximately 6 months. She has followed with Dr.Thimmappa in the past and has had a total of 3 applications of Theraskin (01/2015, 09/2015, 06/17/16). She has also followed with Dr. Con Memos here in the clinic and has received 2 applications of TheraSkin (11/10/15, 11/30/15). The patient does experience chronic, and is not amenable to debridement. 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 She is not currently on any antibiotic  therapy and has not been treated with any recently. 07/17/2016 -- was seen by Dr. Iran Planas of plastic surgery who saw her 2 weeks postop application of Theraskin #3. She had removed her dressing and asked her to apply silver alginate on alternate days and follow-up back with the wound center. Future debridements and application of skin substitute would have to be done in the hospital due to her high risk for anesthesia. READMISSION 04/17/2021 Patient is now a 52 year old woman that we have had in this clinic for a prolonged period of time and 2016-2017 and then again for 2 visits in February 2018. At that point she had wounds on the right lower leg predominantly medial. She had also been seen by plastic surgery Dr. Leland Johns who I believe took her to the OR for operative debridement and application of TheraSkin in 2017. After she left our clinic she was followed for a very prolonged period of time in the wound care center in [facility] who then referred her ultimately to [facility] where she was seen by Dr. Vernona Rieger. Again taken her to the OR for skin grafting which apparently did not take. She had multiple other attempts at dressings although I have not really looked over all of these notes in great detail. She has not been seen in a wound care center in about a year. She states over the last year in addition to her right lower leg she has developed wounds on the left lower leg quite extensive. She is using Xeroform to all of these wounds without really any improvement. She also has Medicaid which does not cover wound products. The patient has had vascular work-ups in the past including most recently on 03/28/2021 showing biphasic waveforms on the right triphasic at the PTA and biphasic at the dorsalis pedis on the left. She was unable to tolerate any degree of compression to do ABIs. Unfortunately TBI's were also not done. She had venous reflux studies done in 2017. This did not show any evidence of a DVT  or SVT and no venous incompetence was noted in the right leg at the time this was the only side with the wound As noted I did not look all over her old records. She apparently had a course of HBO and [facility] although I am not sure what the indication would have been. In any case she developed seizures and terminated treatment earlier. She is generally much more disabled than when we last saw her in clinic. She can no longer walk pretty much wheelchair-bound because predominantly of pain in the left hip.  04/24/2021; the patient tolerated the wraps we put on. We used Santyl and Hydrofera Blue under compression. I brought her back for a nurse visit for a change in dressing. With Medicaid we will have a hard time getting anything paid for and hence the need for compression. She arrives in clinic with all the wounds looking somewhat better in terms of surface 12/20; circumferential wound on the right from the lateral to the medial. She has open areas on the left medial and left lateral x2 on all of this with the same surface. This does not look completely healthy although she does have some epithelialization. She is not complaining of a lot of pain which is unusual for her sickle ulcers. I have not looked over her extensive records from [facility]. She had recent arterial studies and has a history of venous reflux studies I will need to look these over although I do not believe she has significant arterial disease 2023 05/22/2021; patient's wound areas measure slightly smaller. Still a lot of drainage coming from the right we have been using Hydrofera Blue and Santyl with some improvement in the wound surfaces. She tells me she will be getting transfused later in the week for her underlying sickle cell anemia I have looked over her recent arterial studies which were done in the fall. This was in November and showed biphasic and triphasic waveforms but she could not tolerate ABIs because of pressure and  unfortunately TBI's were not done. She has not had recent venous reflux studies that I can see 1/10; not much change about the same surface area. This has a yellowish surface to it very gritty. We have been using Santyl and [address] for a [place], Legrand Rams ([id number]) [id number] Page 10 of 16 prolonged period. Culture I did last week showed methicillin sensitive staph aureus "rare". Our intake nurse reports greenish drainage which may be the Hydrofera Blue itself 1/17; wounds are continue to measure smaller although I am not sure about the accuracy here. Especially the areas on the right are covered in what looks to be a nonviable surface although she does have some epithelialization. Similarly she has areas on the left medial and left lateral ankle area which appear to have a better surface and perhaps are slightly smaller. We have been using Santyl and Hydrofera Blue. She cannot tolerate mechanical debridements She went for her reflux studies which showed significant reflux at the greater saphenous vein at the saphenofemoral junction as well as the greater saphenous vein in the proximal calf on the left she had reflux in the thigh and the common femoral vein and supra vein Fishel vein reflux in the greater saphenous vein. I will have vein and vascular look at this. My thoughts have been that these are likely sickle wounds. I looked through her old records from [facility] wound care center and then when she graduated to [facility] wound care center where she saw Dr. Zigmund Daniel and Dr. Vernona Rieger. Although I can see she had reflux studies done I do not see that she actually saw a vein and vascular. I went over the fact that she had operative debridements and actual skin grafting that did not take. I do not think these wounds have ever really progressed towards healing 1/31;Substantial wounds on the right ankle area. Hyper granulated very gritty adherent debris on the  surface. 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 On the left she has no DVT or SVT deep vein reflux in the common femoral vein. Superficial vein reflux in the greater saphenous vein on although the vein diameter is not really all that large. I do not think there is anything that can be done with these although I am going to send her for consultation to vein and vascular. 2/7; Wound exam; substantial wound area on the right posterior ankle area and areas on the left medial ankle and left lateral ankle. I was able to debride the left medial ankle last week fairly aggressively and it is back this week to a completely nonviable surface She will see vascular surgery this Friday and I would like them to review the venous studies and also any comments on her arterial status. If they do not see an issue here I am going to refer her to plastic surgery for an operative debridement perhaps intraoperative ACell or Integra. Eventually she will require a deep tissue culture again 2/14; substantial wound area on the right posterior ankle, medial ankle. We have been using silver alginate The patient was seen by vein and vascular she had both venous reflux studies and arterial studies. 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 She had no evidence of superficial venous thrombosis. She did have superficial vein reflux at the saphenofemoral junction and the greater saphenous vein. On the left no evidence of a DVT no evidence of superficial venous throat thrombosis she did have deep vein  reflux in the common femoral vein and superficial vein reflux in the greater saphenous vein but these were not felt to be amenable to ablation. In terms of arterial studies she had triphasic and wife biphasic wave waveforms bilaterally not felt to have a significant arterial issue. I do not get the feeling that they felt that any part of her nonhealing wounds were related to either arterial or venous issues. They did note that she had venous reflux at the right at the [facility] and [facility]. And also on the left there were reflux in the deep system at the common femoral vein and greater saphenous vein in the proximal thigh. Nothing amenable to ablation. 2/20; she is making some decent progress on the right where there is nice skin between the 2 open areas on the right ankle. The surfaces here do not look viable yet there is some surrounding epithelialization. She still has a small area on the left medial ankle area. Hyper-granulated [name] away always 2/28 patient has an appointment with plastic surgery on 3/8. We will see her back on 3/9. She may have to call us to get the area redressed. We've been using Santyl under silver alginate. We made a nice improvement on the left medial ankle. The larger wounds on the right also looks somewhat better in terms of epithelialization although I think they could benefit from an aggressive debridement if plastic surgery would be willing to do that. Perhaps placement of Integra or a cell 07/26/2021: She saw Dr. Claudia Desanctis yesterday. He raised the question as to whether or not this might be pyoderma and wanted to wait until that question was answered  by dermatology before proceeding with any sort of operative debridement. We have continue to use Santyl under silver alginate with Kerlix and Coban wraps. Overall, her wounds appear to be continuing to contract and epithelialize, with some granulation tissue present. There continues to be some slough on all wound surfaces. 08/09/2021: She  has not been able to get an appointment with dermatology because apparently the offices in [place] do not accept Medicaid. She is looking into whether or not she can be seen at the main [facility] dermatology clinic. This is necessary because plastic surgery is concerned that her wounds might represent pyoderma and they did not want to do any procedure until that was clarified. We have been using Santyl under silver alginate with Kerlix and Coban wraps. Today, there was a greater amount of drainage on her dressings with a slight green discoloration and significant odor. Despite this, her wounds continue to contract and epithelialize. There is pale granulation tissue present and actually, on the left medial ankle, the granulation tissue is a bit hypertrophic. 08/16/2021: Last week, I took a culture and this grew back rare methicillin-resistant Staph aureus and rare corynebacterium. The MRSA was sensitive to gentamicin which we began applying topically on an empiric basis. This week, her wounds are a bit smaller and the drainage and odor are less. Her primary care provider is working on assisting the patient with a dermatology evaluation. She has been in silver alginate over the gentamicin that was started last week along with Kerlix and Coban wraps. 08/23/2021: Because she has Medicaid, we have been unable to get her into see any dermatologist in the [place] to rule out pyoderma gangrenosum, which was a requirement from plastic surgery prior to any sort of debridement and grafting. Despite this, however, all of her wounds continue to get smaller. The wound on her left medial ankle is nearly closed. There is no odor from the wounds, although she still accumulates a modest amount of drainage on her dressings. 08/30/2021: The lateral right ankle wound and the medial left ankle wound are a bit smaller today. The medial right ankle wound is about the same size. They are less tender. We  have still been unable to get her into dermatology. 09/06/2021: All of the wounds are about the same size today. She continues to endorse minimal pain. I communicated with Dr. Claudia Desanctis in plastic surgery regarding our issues getting a dermatology appointment; he was out of town but indicated that he would look into perhaps performing the biopsy in his office and will have his office contact her. 09/14/2021: The patient has an appointment in dermatology, but it is not until October. Her wounds are roughly the same; she continues to have very thick purulent-looking drainage on her dressings. 09/20/2021: The left medial wound is nearly closed and just has a bit of accumulated eschar on the surface. The right medial and lateral ankle wounds are perhaps a little bit smaller. They continue to have a very pale surface with accumulation of thin slough. 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 I did not think [employer] was indicated based on this. She is getting topical mupirocin with Prisma silver collagen. 10/04/2021: The patient was not seen in clinic last week due to childcare coverage issues. In the interim, the left medial leg wound has closed. The right sided leg wounds are smaller. There is more granulation tissue coming through, particularly on the lateral wound. The surface remains somewhat gritty.  We have been applying topical mupirocin and Prisma silver collagen. 10/11/2021: The left medial leg wound remains closed. She does complain of some anesthetic sensation to the area. Both of the right-sided leg wounds are smaller but still have accumulated slough. 10/18/2021: Both right-sided leg wounds are minimally smaller this week. She still continues to accumulate slough and has thick drainage on her dressings. 10/23/2021: Both wounds continue to contract. There is still slough buildup. She has been approved for a keratin-based skin substitute trial product but it will not [place], Legrand Rams  ([id number]) [id number] Page 11 of 16 be available until next week. 10/30/2021: The wounds are about the same to perhaps slightly smaller. There is still continued slough buildup. Unfortunately, the rep for the keratin based product did not show up today and did not answer his phone when called. 11/08/2021: The wounds are little bit smaller today. She continues to have thick drainage but the surfaces are relatively clean with just a little bit of slough accumulation. She reported to me today that she is unable to completely flex her left ankle and on examination it seems this is potentially related to scar tissue from her wounds. We do have the ProgenaMatrix trial product available for her today. 11/15/2021: Both wounds are smaller today. There is some slough accumulation on the surfaces, but the medial wound, in particular looks like it is filling in and is less deep. She did hear from physical therapy and she is going to start working with them on July 11. She is here for her second application of the trial skin substitute, ProgenaMatrix. 11/22/2021: Both wounds continue to contract, the medial more dramatically than the lateral. Both wounds have a layer of slough on the surface, but underneath this, the gritty fibrous tissue has a little bit more of a pink cast to it rather than being as pale as it has been. 11/29/2021: The wounds are roughly the same size this week, perhaps a millimeter or 2 smaller. The medial wound has filled in and is nearly flush with the surrounding skin surface. She continues to have a lot of slough accumulation on both surfaces. 12/06/2021: No significant change to her wounds, but she has a new opening on her dorsal foot, just distal to the right lateral ankle wound. The area on her left medial ankle that reopened looks a little bit larger today. She has quite a bit of pain associated with the new wound. 12/12/2021: Her wounds look about the same but  the new opening on her right lateral dorsal foot is a little bit bigger. She continues to have a fair amount of pain with this wound. 12/26/2021: The left medial ankle wound is tiny and superficial. She has 2 areas of crusting on her left lateral ankle, however, that appear to be threatening to open again. Her right medial ankle wound is a little bit smaller today but still continues to accumulate thick rubbery slough. The new dorsal foot wound is exquisitely painful but there is no odor or purulent drainage. No erythema or induration. The right lateral ankle wound looks about the same today, again with thick rubbery slough. 01/03/2022: The left medial ankle wound has closed again. Both right ankle wounds appear to be about the same size with thick rubbery slough. The dorsal foot wound on the right continues to be quite painful and she stated that she did not want any debridement of that site today. 01/10/2022: No real change to any of her wounds. She continues to accumulate thick  slough. The dorsal foot wound has merged with the lateral malleolar wound. She is experiencing significant pain in the dorsal foot portion of the ulcer. 01/16/2022: Absolutely no change or progress in her wounds. 01/24/2022: Her wounds are unchanged. She continues to build up slough and the wounds on her dorsal right foot are still exquisitely tender. 01/31/2022: The wounds actually measure a little bit narrower today. They still have thick slough on the surface but the underlying tissue seems a little less fibrotic. We changed to Iodosorb last week. 02/07/2022: Wounds continue to slowly epithelialize around the parameters. She has less pain today. She still accumulates a fairly substantial layer of slough. 02/15/2022: No change to the wounds overall. The measured a little bit larger today per the intake nurse. She continues to have substantial slough accumulation and her pain is a little bit worse. 02/21/2022. No change at all to  her wounds. I do not see that plastic surgery has received her referral yet. 02/28/2022: The wounds remain unchanged. They are dry and fibrotic with accumulation of slough and eschar. She did receive an appointment to see plastic surgery on October 23, but the office called her back and indicated they needed to reschedule and that the next available appointment was not until December. The patient became angry and decided she did not want to see this plastics group. 10/19; the patient sees Dr. Lovett Calender again next week. She is using Medihoney as the primary dressing changing this herself. 03/21/2022: She saw Dr. Jaclynn Guarneri at the wound care center at [facility]. Dr. Jaclynn Guarneri was also unable to debride her, secondary to pain and is planning to take her to the operating room for operative debridement and potential skin substitute placement. Her wounds are unchanged with thick yellow slough and a fibrotic base. She says they are too painful to be debrided. In addition, yesterday her hemoglobin was 4 and she received 2 units packed red blood cell transfusion. 04/04/2022: Her operation at [facility] is scheduled for December 15. She continues to use Medihoney on her wounds. They are a little bit less painful today and she is willing to entertain the possibility of debridement. The wounds measured a little bit larger in all dimensions today but the layer of slough is not as thick as usual. 04/18/2022: Her wounds actually measured a little bit smaller today and the extension onto the dorsal foot from the lateral wound has healed. They are less tender and there is significantly less slough present as compared to prior visits. 05/09/2022: She had to reschedule her surgery due to lack of childcare. It is now planned for January 5. The wounds are basically unchanged, but she had a lot more drainage, which was thick and somewhat purulent in appearance. No significant odor. 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 They are quite painful today and she does not want to have debridement performed. 05/30/2022: The patient underwent surgical debridement and placement of TheraSkin to her wounds on January 5. She has been doing well and does not endorse any pain. The TheraSkin is intact and looks beautiful. It is sutured in place. There is no exudate or significant drainage. 06/04/2022: Her TheraSkin remains intact and I am starting to see granulation tissue emerging underneath the surface. No concern for infection. 06/12/2022: The TheraSkin is intact and there is more granulation tissue emerging. It is starting to look a little bit dry, however. No malodor or purulent drainage. 06/19/2022: The TheraSkin continues to look good. The  edges are a bit dry, but the central portion of each of her wounds is showing a nice pink color. 06/27/2022: The TheraSkin on the lateral wound remains almost completely intact. There is nice pink tissue peaking through the mesh of the TheraSkin. On the medial leg, it is starting to breakdown a little bit, but most of the TheraSkin remains intact here, as well. Both wounds measured smaller today. 07/03/2022: The wounds are fairly dry around the perimeter today. Some of the suture is hanging loose. Patient History Information obtained from Patient. Family History Diabetes - Mother, Lung Disease - Mother, ZULI, MEALOR ([id number]) [id number] Page 12 of 16 No family history of Cancer, Heart Disease, Hereditary Spherocytosis, Hypertension, Kidney Disease, Seizures, Stroke, Thyroid Problems, Tuberculosis. Social History Never smoker, Marital Status - Married, Alcohol Use - Never, Drug Use - No History, Caffeine Use - Daily. Medical History Eyes Denies history of Cataracts, Glaucoma, Optic Neuritis Ear/Nose/Mouth/Throat Denies history of Chronic sinus problems/congestion, Middle ear problems Hematologic/Lymphatic Patient has  history of Anemia, Sickle Cell Disease Denies history of Hemophilia, Human Immunodeficiency Virus, Lymphedema Respiratory Denies history of Aspiration, Asthma, Chronic Obstructive Pulmonary Disease (COPD), Pneumothorax, Sleep Apnea, Tuberculosis Cardiovascular Denies history of Angina, Arrhythmia, Congestive Heart Failure, Coronary Artery Disease, Deep Vein Thrombosis, Hypertension, Hypotension, Myocardial Infarction, Peripheral Arterial Disease, Peripheral Venous Disease, Phlebitis, Vasculitis Gastrointestinal Denies history of Cirrhosis , Colitis, Crohnoos, Hepatitis A, Hepatitis B, Hepatitis C Endocrine Denies history of Type I Diabetes, Type II Diabetes Genitourinary Denies history of End Stage Renal Disease Immunological Denies history of Lupus Erythematosus, Raynaudoos, Scleroderma Integumentary (Skin) Denies history of History of Burn Musculoskeletal Denies history of Gout, Rheumatoid Arthritis, Osteoarthritis, Osteomyelitis Neurologic Patient has history of Neuropathy - right foot intermittant Denies history of Dementia, Quadriplegia, Paraplegia, Seizure Disorder Oncologic Denies history of Received Chemotherapy, Received Radiation Psychiatric Denies history of Anorexia/bulimia, Confinement Anxiety Hospitalization/Surgery History - c section x2. - left breast lumpectomy. - iandD right ankle with theraskin. Medical A Surgical History Notes nd Constitutional Symptoms (General Health) H/O miscarriage Cardiovascular bradycardia Gastrointestinal cholilithiasis Objective Constitutional Slightly hypertensive. no acute distress. Vitals Time Taken: 10:01 AM, Height: 67 in, Weight: 134 lbs, BMI: 21, Temperature: 98.2 F, Pulse: 86 bpm, Respiratory Rate: 20 breaths/min, Blood Pressure: 146/76 mmHg. Respiratory Normal work of breathing on room air. General Notes: 07/03/2022: The wounds are fairly dry around the perimeter today. Some of the suture is hanging  loose. Integumentary (Hair, Skin) Wound #17 status is Open. Original cause of wound was Gradually Appeared. The date acquired was: 10/05/2012. The wound has been in treatment 63 weeks. The wound is located on the Right,Lateral Lower Leg. The wound measures 6.3cm length x 5.2cm width x 0.1cm depth; 25.73cm^2 area and 2.573cm^3 volume. There is Fat Layer (Subcutaneous Tissue) exposed. There is no tunneling or undermining noted. There is a medium amount of serosanguineous drainage noted. The wound margin is distinct with the outline attached to the wound base. There is medium (34-66%) pink granulation within the wound bed. There is a medium (34-66%) amount of necrotic tissue within the wound bed including Adherent Slough. The periwound skin appearance had no abnormalities noted for moisture. The periwound skin appearance had no abnormalities noted for color. The periwound skin appearance exhibited: Scarring. Periwound temperature was noted as No Abnormality. Wound #21 status is Open. Original cause of wound was Gradually Appeared. The date acquired was: 06/26/2021. The wound has been in treatment 53 weeks. The wound is located on the Right,Medial Ankle. The wound measures 7.5cm length x 3.3cm  width x 0.1cm depth; 19.439cm^2 area and 1.944cm^3 volume. There is Fat Layer (Subcutaneous Tissue) exposed. There is no tunneling or undermining noted. There is a medium amount of serosanguineous drainage noted. The wound margin is distinct with the outline attached to the wound base. There is medium (34-66%) pink granulation within the wound bed. There is a medium (34-66%) amount of necrotic tissue within the wound bed including Adherent Slough. The periwound skin appearance had no abnormalities noted for moisture. The periwound skin appearance had no abnormalities noted for color. The periwound skin appearance exhibited: Scarring. Periwound temperature was noted as No Abnormality. The periwound has tenderness on  palpation. 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 There was a Selective/Open Wound Non-Viable Tissue Debridement with a total area of 6.3 sq cm performed by Fredirick Maudlin, MD. With the following instrument(s): Forceps, and Scissors to remove Non-Viable tissue/material. Material removed includes Eschar and Fibrin/Exudate and. No specimens were taken. A time out was conducted at 10:30, prior to the start of the procedure. A Minimum amount of bleeding was controlled with Pressure. The procedure was tolerated well with a pain level of 0 throughout and a pain level of 0 following the procedure. Post Debridement Measurements: 6.3cm length x 5.2cm width x 0.1cm depth; 2.573cm^3 volume. Character of Wound/Ulcer Post Debridement is improved. Post procedure Diagnosis Wound #17: Same as Pre-Procedure General Notes: Scribed for Dr. Celine Ahr by Baruch Gouty, RN. Wound #21 Pre-procedure diagnosis of Wound #21 is a Sickle Cell Lesion located on the Right,Medial Ankle .Severity of Tissue Pre Debridement is: Fat layer exposed. There was a Selective/Open Wound Non-Viable Tissue Debridement with a total area of 7.5 sq cm performed by Fredirick Maudlin, MD. With the following instrument(s): Curette, Forceps, and Scissors to remove Non-Viable tissue/material. Material removed includes Eschar, Fibrin/Exudate, and Other: thereskin. No specimens were taken. A time out was conducted at 10:30, prior to the start of the procedure. A Minimum amount of bleeding was controlled with Pressure. The procedure was tolerated well with a pain level of 0 throughout and a pain level of 0 following the procedure.  Post Debridement Measurements: 7.5cm length x 3.3cm width x 0.1cm depth; 1.944cm^3 volume. Character of Wound/Ulcer Post Debridement is improved. Severity of Tissue Post Debridement is: Fat layer exposed. Post procedure Diagnosis Wound #21: Same as Pre-Procedure General Notes: Scribed for Dr. Celine Ahr by Baruch Gouty, RN. Plan Follow-up Appointments: Return Appointment in 1 week. - Dr. Celine Ahr - room 1 Friday 2/23 @ 09:00 am Cellular or Tissue Based Products: Other Cellular or Tissue Based Products Orders/Instructions: - Theraskin applied by Psychiatric nurse, leave on until further notice Bathing/ Shower/ Hygiene: Do not shower or bathe in tub. Edema Control - Lymphedema / SCD / Other: Avoid standing for long periods of time. Exercise regularly Additional Orders / Instructions: Follow Nutritious Diet WOUND #17: - Lower Leg Wound Laterality: Right, Lateral Cleanser: Soap and Water Every Other Day/30 Days Discharge Instructions: May shower and wash wound with dial antibacterial soap and water prior to dressing change. Cleanser: Wound Cleanser Every Other Day/30 Days Discharge Instructions: Cleanse the wound with wound cleanser prior to applying a clean dressing using gauze sponges, not tissue or cotton balls. Peri-Wound Care: Triamcinolone 15 (g) Every Other Day/30 Days Discharge Instructions: Use  triamcinolone 15 (g) as directed Peri-Wound Care: Sween Lotion (Moisturizing lotion) Every Other Day/30 Days Discharge Instructions: Apply moisturizing lotion as directed Topical: Skintegrity Hydrogel 4 (oz) Every Other Day/30 Days Discharge Instructions: Apply hydrogel as directed Prim Dressing: [place] 3x4.25 (in/in) Every Other Day/30 Days ary Discharge Instructions: Apply to wound bed as instructed Secondary Dressing: ABD Pad, 5x9 Every Other Day/30 Days Discharge Instructions: Apply over primary dressing as directed. Secured With: Elastic Bandage 4 inch (ACE bandage) Every Other  Day/30 Days Discharge Instructions: Secure with ACE bandage as directed. Com pression Wrap: Kerlix Roll 4.5x3.1 (in/yd) Every Other Day/30 Days Discharge Instructions: Apply Kerlix and Coban compression as directed. WOUND #21: - Ankle Wound Laterality: Right, Medial Cleanser: Soap and Water Every Other Day/30 Days Discharge Instructions: May shower and wash wound with dial antibacterial soap and water prior to dressing change. Cleanser: Wound Cleanser Every Other Day/30 Days Discharge Instructions: Cleanse the wound with wound cleanser prior to applying a clean dressing using gauze sponges, not tissue or cotton balls. Peri-Wound Care: Triamcinolone 15 (g) Every Other Day/30 Days Discharge Instructions: Use triamcinolone 15 (g) as directed Peri-Wound Care: Sween Lotion (Moisturizing lotion) Every Other Day/30 Days Discharge Instructions: Apply moisturizing lotion as directed Prim Dressing: ADAPTIC TOUCH 3x4.25 (in/in) Every Other Day/30 Days ary Discharge Instructions: Apply to wound bed as instructed Secondary Dressing: ABD Pad, 5x9 Every Other Day/30 Days Discharge Instructions: Apply over primary dressing as directed. Secured With: Elastic Bandage 4 inch (ACE bandage) Every Other Day/30 Days [name], GOLEN ([id number]) [phone].pdf Page 14 of 16 Discharge Instructions: Secure with ACE bandage as directed. Compression Wrap: Kerlix Roll 4.5x3.1 (in/yd) Every Other Day/30 Days Discharge Instructions: Apply Kerlix and Coban compression as directed. 07/03/2022: The wounds are fairly dry around the perimeter today. Some of the suture is hanging loose. I used scissors and forceps to trim the remaining suture and remove it. I also trimmed away loose, nonadherent TheraSkin. I used a curette to debride dry eschar from around the wound edges. We will continue liberal application of hydrogel with Adaptic and Kerlix and Ace wrap compression. Follow-up in 1 week. Electronic  Signature(s) Signed: 07/03/2022 10:46:30 AM By: Fredirick Maudlin MD FACS Entered By: Fredirick Maudlin on 07/03/2022 10:46:30 -------------------------------------------------------------------------------- HxROS Details Patient Name: Date of Service: [name] [name], [name] 07/03/2022 10:00 A M Medical Record Number: [id number] Patient Account Number: [id number] Date of Birth/Sex: Treating RN: 01-Jul-1970 (52 y.o. F) Primary Care Provider: [name] Other Clinician: Referring Provider: Treating Provider/Extender: Elyse Jarvis Weeks in Treatment: 70 Information Obtained From Patient Constitutional Symptoms (General Health) Medical History: Past Medical History Notes: H/O miscarriage Eyes Medical History: Negative for: Cataracts; Glaucoma; Optic Neuritis Ear/Nose/Mouth/Throat Medical History: Negative for: Chronic sinus problems/congestion; Middle ear problems Hematologic/Lymphatic Medical History: Positive for: Anemia; Sickle Cell Disease Negative for: Hemophilia; Human Immunodeficiency Virus; Lymphedema Respiratory Medical History: Negative for: Aspiration; Asthma; Chronic Obstructive Pulmonary Disease (COPD); Pneumothorax; Sleep Apnea; Tuberculosis Cardiovascular Medical History: Negative for: Angina; Arrhythmia; Congestive Heart Failure; Coronary Artery Disease; Deep Vein Thrombosis; Hypertension; Hypotension; Myocardial Infarction; Peripheral Arterial Disease; Peripheral Venous Disease; Phlebitis; Vasculitis Past Medical History Notes: bradycardia Gastrointestinal Medical History: Negative for: Cirrhosis ; Colitis; Crohns; Hepatitis A; Hepatitis B; Hepatitis C Past Medical History NotesNASIR, STOLTMAN ([id number]) [id number] Page 15 of 16 Endocrine Medical History: Negative for: Type I Diabetes; Type II Diabetes Genitourinary Medical History: Negative for: End Stage Renal Disease Immunological Medical  History: Negative for: Lupus Erythematosus; Raynauds; Scleroderma Integumentary (Skin) Medical History: Negative for: History  of Burn Musculoskeletal Medical History: Negative for: Gout; Rheumatoid Arthritis; Osteoarthritis; Osteomyelitis Neurologic Medical History: Positive for: Neuropathy - right foot intermittant Negative for: Dementia; Quadriplegia; Paraplegia; Seizure Disorder Oncologic Medical History: Negative for: Received Chemotherapy; Received Radiation Psychiatric Medical History: Negative for: Anorexia/bulimia; Confinement Anxiety Immunizations Pneumococcal Vaccine: Received Pneumococcal Vaccination: No Implantable Devices None Hospitalization / Surgery History Type of Hospitalization/Surgery c section x2 left breast lumpectomy iandD right ankle with theraskin Family and Social History Cancer: No; Diabetes: Yes - Mother; Heart Disease: No; Hereditary Spherocytosis: No; Hypertension: No; Kidney Disease: No; Lung Disease: Yes - Mother; Seizures: No; Stroke: No; Thyroid Problems: No; Tuberculosis: No; Never smoker; Marital Status - Married; Alcohol Use: Never; Drug Use: No History; Caffeine Use: Daily; Financial Concerns: No; Food, Clothing or Shelter Needs: No; Support System Lacking: No; Transportation Concerns: No Electronic Signature(s) Signed: 07/03/2022 10:47:57 AM By: Fredirick Maudlin MD FACS Entered By: Fredirick Maudlin on 07/03/2022 10:45:03 -------------------------------------------------------------------------------- SuperBill Details Patient Name: Date of Service: [name] [name], Almond [name] 07/03/2022 [name] ([id number] Page 16 of 16 Medical Record Number: [id number] Patient Account Number: [id number] Date of Birth/Sex: Treating RN: 1970/06/15 (52 y.o. F) Primary Care Provider: [name] Other Clinician: Referring Provider: Treating Provider/Extender: Elyse Jarvis Weeks in Treatment:  63 Diagnosis Coding ICD-10 Codes Code Description L97.818 Non-pressure chronic ulcer of other part of right lower leg with other specified severity 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 Facility Procedures : CPT4 Code: [id number] Description: [phone] - DEBRIDE WOUND 1ST 20 SQ CM OR < ICD-10 Diagnosis Description L97.818 Non-pressure chronic ulcer of other part of right lower leg with other specified s Modifier: everity Quantity: 1 Physician Procedures : CPT4 Code Description Modifier [id number] - WC PHYS LEVEL 3 - EST PT 25 ICD-10 Diagnosis Description L97.818 Non-pressure chronic ulcer of other part of right lower leg with other specified severity 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 Quantity: 1 : [id number] - WC PHYS DEBR WO ANESTH 20 SQ CM ICD-10 Diagnosis Description L97.818 Non-pressure chronic ulcer of other part of right lower leg with other specified severity Quantity: 1 Electronic Signature(s) Signed: 07/03/2022 10:46:52 AM By: Fredirick Maudlin MD FACS Entered By: Fredirick Maudlin on 07/03/2022 10:46:51

## 2022-07-12 ENCOUNTER — Encounter (HOSPITAL_BASED_OUTPATIENT_CLINIC_OR_DEPARTMENT_OTHER): Payer: Medicaid Other | Admitting: General Surgery

## 2022-07-12 DIAGNOSIS — L97818 Non-pressure chronic ulcer of other part of right lower leg with other specified severity: Secondary | ICD-10-CM | POA: Diagnosis not present

## 2022-07-13 NOTE — Progress Notes (Signed)
ERCELLE, CONTESSA ([id number]) [id number] Page 1 of 10 Visit Report for 07/12/2022 Arrival Information Details Patient Name: Date of Service: [name] 07/12/2022 9:15 A M Medical Record Number: [id number] Patient Account Number: [id number] Date of Birth/Sex: Treating RN: 1970-10-27 (52 y.o. F) Primary Care Calvert Charland: [name] Other Clinician: Referring Lyrique Hakim: Treating Osmani Kersten/Extender: Elyse Jarvis Weeks in Treatment: 21 Visit Information History Since Last Visit All ordered tests and consults were completed: No Patient Arrived: [name] Added or deleted any medications: No Arrival Time: 08:46 Any new allergies or adverse reactions: No Accompanied By: self Had a fall or experienced change in No Transfer Assistance: None activities of daily living that may affect Patient Identification Verified: Yes risk of falls: Secondary Verification Process Completed: Yes Signs or symptoms of abuse/neglect since last visito No Patient Requires Transmission-Based Precautions: No Hospitalized since last visit: No Patient Has Alerts: No Implantable device outside of the clinic excluding No cellular tissue based products placed in the center since last visit: Pain Present Now: No Electronic Signature(s) Signed: 07/12/2022 10:52:22 AM By: Worthy Rancher Entered By: Worthy Rancher on 07/12/2022 08:46:46 -------------------------------------------------------------------------------- Clinic Level of Care Assessment Details Patient Name: Date of Service: [name] 07/12/2022 9:15 A M Medical Record Number: [id number] Patient Account Number: [id number] Date of Birth/Sex: Treating RN: 08/10/1970 (52 y.o. [name] Primary Care Hallie Ishida: [name] Other Clinician: Referring Shriley Joffe: Treating Mariana Wiederholt/Extender: Elyse Jarvis Weeks in Treatment: 57 Clinic Level of Care Assessment Items TOOL 4 Quantity  Score X- 1 0 Use when only an EandM is performed on FOLLOW-UP visit ASSESSMENTS - Nursing Assessment / Reassessment '[]'$  - 0 Reassessment of Co-morbidities (includes updates in patient status) '[]'$  - 0 Reassessment of Adherence to Treatment Plan ASSESSMENTS - Wound and Skin A ssessment / Reassessment X - Simple Wound Assessment / Reassessment - one wound 1 5 '[]'$  - 0 Complex Wound Assessment / Reassessment - multiple wounds '[]'$  - 0 Dermatologic / Skin Assessment (not related to wound area) ASSESSMENTS - Focused Assessment '[]'$  - 0 Circumferential Edema Measurements - multi extremities '[]'$  - 0 Nutritional Assessment / Counseling / Intervention '[]'$  - 0 Lower Extremity Assessment (monofilament, tuning fork, pulses) '[]'$  - 0 Peripheral Arterial Disease Assessment (using hand held doppler) ASSESSMENTS - Ostomy and/or Continence Assessment and Care '[]'$  - 0 Incontinence Assessment and Management '[]'$  - 0 Ostomy Care Assessment and Management (repouching, etc.) PROCESS - Coordination of Care X - Simple Patient / Family Education for ongoing care 1 15 [place], Legrand Rams ([id number]) [id number] Page 2 of 10 '[]'$  - 0 Complex (extensive) Patient / Family Education for ongoing care X- 1 10 Staff obtains Consents, Records, T Results / Process Orders est '[]'$  - 0 Staff telephones HHA, Nursing Homes / Clarify orders / etc '[]'$  - 0 Routine Transfer to another Facility (non-emergent condition) '[]'$  - 0 Routine Hospital Admission (non-emergent condition) '[]'$  - 0 New Admissions / Biomedical engineer / Ordering NPWT Apligraf, etc. , '[]'$  - 0 Emergency Hospital Admission (emergent condition) X- 1 10 Simple Discharge Coordination '[]'$  - 0 Complex (extensive) Discharge Coordination PROCESS - Special Needs '[]'$  - 0 Pediatric / Minor Patient Management '[]'$  - 0 Isolation Patient Management '[]'$  - 0 Hearing / Language / Visual special needs '[]'$  - 0 Assessment of Community assistance  (transportation, D/C planning, etc.) '[]'$  - 0 Additional assistance / Altered mentation '[]'$  - 0 Support Surface(s) Assessment (bed, cushion, seat, etc.) INTERVENTIONS - Wound Cleansing / Measurement X - Simple Wound Cleansing -  one wound 1 5 '[]'$  - 0 Complex Wound Cleansing - multiple wounds X- 1 5 Wound Imaging (photographs - any number of wounds) '[]'$  - 0 Wound Tracing (instead of photographs) X- 1 5 Simple Wound Measurement - one wound '[]'$  - 0 Complex Wound Measurement - multiple wounds INTERVENTIONS - Wound Dressings X - Small Wound Dressing one or multiple wounds 1 10 '[]'$  - 0 Medium Wound Dressing one or multiple wounds '[]'$  - 0 Large Wound Dressing one or multiple wounds '[]'$  - 0 Application of Medications - topical '[]'$  - 0 Application of Medications - injection INTERVENTIONS - Miscellaneous '[]'$  - 0 External ear exam '[]'$  - 0 Specimen Collection (cultures, biopsies, blood, body fluids, etc.) '[]'$  - 0 Specimen(s) / Culture(s) sent or taken to Lab for analysis '[]'$  - 0 Patient Transfer (multiple staff / Civil Service fast streamer / Similar devices) '[]'$  - 0 Simple Staple / Suture removal (25 or less) '[]'$  - 0 Complex Staple / Suture removal (26 or more) '[]'$  - 0 Hypo / Hyperglycemic Management (close monitor of Blood Glucose) '[]'$  - 0 Ankle / Brachial Index ([name]) - do not check if billed separately X- 1 5 Vital Signs Has the patient been seen at the hospital within the last three years: Yes Total Score: 70 Level Of Care: New/Established - Level 2 Electronic Signature(s) Signed: 07/12/2022 3:17:57 PM By: Adline Peals Entered By: Adline Peals on 07/12/2022 09:19:19 [name] ([id number]) [id number] Page 3 of 10 -------------------------------------------------------------------------------- Encounter Discharge Information Details Patient Name: Date of Service: [name] 07/12/2022 9:15 A M Medical Record Number: [id number] Patient Account Number:  [id number] Date of Birth/Sex: Treating RN: [birth year] (52 y.o. [name] Primary Care Sarin Comun[place]: [name] Other Clinician: Referring Kiki Bivens: Treating Emmalina Espericueta/Extender: Elyse Jarvis Weeks in Treatment: 21 Encounter Discharge Information Items Discharge Condition: Stable Ambulatory Status: Cane Discharge Destination: Home Transportation: Private Auto Accompanied By: self Schedule Follow-up Appointment: Yes Clinical Summary of Care: Patient Declined Electronic Signature(s) Signed: 07/12/2022 3:17:57 PM By: [name] By: Adline Peals on 07/12/2022 09:19:52 -------------------------------------------------------------------------------- Lower Extremity Assessment Details Patient Name: Date of Service: [name], [place] [name] 07/12/2022 9:15 A M Medical Record Number: [id number] Patient Account Number: [id number] Date of Birth/Sex: Treating RN: September 14, 1970 (52 y.o. [name] Primary Care Yvan Dority: [name] Other Clinician: Referring Kyrese Gartman: Treating Aniela Caniglia/Extender: Elyse Jarvis Weeks in Treatment: 64 Edema Assessment Assessed: [Left: No] [Right: No] Edema: [Left: Ye] [Right: s] Calf Left: Right: Point of Measurement: 33 cm From Medial Instep 31.5 cm Ankle Left: Right: Point of Measurement: 10 cm From Medial Instep 19.5 cm Electronic Signature(s) Signed: 07/12/2022 3:17:57 PM By: Adline Peals Entered By: Adline Peals on 07/12/2022 09:07:23 -------------------------------------------------------------------------------- Multi Wound Chart Details Patient Name: Date of Service: [name], [name] 07/12/2022 9:15 A M Medical Record Number: [id number] Patient Account Number: [id number] Date of Birth/Sex: Treating RN: 08/15/1970 (52 y.o. F) Primary Care Latoria Dry: [name] Other Clinician: Referring Quadry Kampa: Treating Briyanna Billingham/Extender: Elyse Jarvis Weeks in Treatment: 37 Vital Signs Height(in): 67 Pulse(bpm): 69 Weight(lbs): 134 Blood Pressure(mmHg): 130/72 Body Mass Index(BMI): 21 Temperature(F): 98.2 Janowicz, Steven ([id number]) [id number] Page 4 of 10 Respiratory Rate(breaths/min): 20 [17:Photos:] [N/A:N/A] Right, Lateral Lower Leg Right, Medial Ankle N/A Wound Location: Gradually Appeared Gradually Appeared N/A Wounding Event: Sickle Cell Lesion Sickle Cell Lesion N/A Primary Etiology: N/A Venous Leg Ulcer N/A Secondary Etiology: Anemia, Sickle Cell Disease, Anemia, Sickle Cell Disease, N/A Comorbid History: Neuropathy Neuropathy 10/05/2012 06/26/2021 N/A Date Acquired:  64 54 N/A Weeks of Treatment: Open Open N/A Wound Status: No No N/A Wound Recurrence: Yes No N/A Clustered Wound: 6x4.9x0.1 7x3x0.1 N/A Measurements L x W x D (cm) 23.091 16.493 N/A A (cm) : rea 2.309 1.649 N/A Volume (cm) : 87.70% 43.10% N/A % Reduction in Area: 87.70% 43.10% N/A % Reduction in Volume: Full Thickness Without Exposed Full Thickness Without Exposed N/A Classification: Support Structures Support Structures Medium Medium N/A Exudate Amount: Serosanguineous Serosanguineous N/A Exudate Type: red, brown red, brown N/A Exudate Color: Distinct, outline attached Distinct, outline attached N/A Wound Margin: Medium (34-66%) Medium (34-66%) N/A Granulation Amount: Pink Pink N/A Granulation Quality: Medium (34-66%) Medium (34-66%) N/A Necrotic Amount: Fat Layer (Subcutaneous Tissue): Yes Fat Layer (Subcutaneous Tissue): Yes N/A Exposed Structures: Fascia: No Fascia: No Tendon: No Tendon: No Muscle: No Muscle: No Joint: No Joint: No Bone: No Bone: No Small (1-33%) Small (1-33%) N/A Epithelialization: Scarring: Yes Scarring: Yes N/A Periwound Skin Texture: No Abnormalities Noted No Abnormalities Noted N/A Periwound Skin Moisture: No Abnormalities Noted No Abnormalities Noted  N/A Periwound Skin Color: No Abnormality No Abnormality N/A Temperature: N/A Yes N/A Tenderness on Palpation: Treatment Notes Electronic Signature(s) Signed: 07/12/2022 9:15:46 AM By: Fredirick Maudlin MD FACS Entered By: Fredirick Maudlin on 07/12/2022 09:15:45 -------------------------------------------------------------------------------- Multi-Disciplinary Care Plan Details Patient Name: Date of Service: [name], [name] 07/12/2022 9:15 A M Medical Record Number: [id number] Patient Account Number: [id number] Date of Birth/Sex: Treating RN: 07-16-1970 (52 y.o. [name] Primary Care Johari Pinney: [name] Other Clinician: Referring Tommaso Cavitt: Treating Merriel Zinger/Extender: Elyse Jarvis Weeks in Treatment: 27 [place] reviewed with physician Active Inactive Venous Leg Ulcer Nursing Diagnoses: XENA, OMAN ([id number]) [id number] Page 5 of 10 Actual venous Insuffiency (use after diagnosis is confirmed) Knowledge deficit related to disease process and management Goals: Patient will maintain optimal edema control Date Initiated: 06/26/2021 Target Resolution Date: 08/15/2022 Goal Status: Active Interventions: Assess peripheral edema status every visit. Compression as ordered Treatment Activities: Therapeutic compression applied : 06/26/2021 Notes: Wound/Skin Impairment Nursing Diagnoses: Impaired tissue integrity Goals: Patient/caregiver will verbalize understanding of skin care regimen Date Initiated: 04/17/2021 Target Resolution Date: 08/15/2022 Goal Status: Active Ulcer/skin breakdown will have a volume reduction of 30% by week 4 Date Initiated: 04/17/2021 Date Inactivated: 05/29/2021 Target Resolution Date: 05/15/2021 Goal Status: Met Ulcer/skin breakdown will have a volume reduction of 50% by week 8 Date Initiated: 05/29/2021 Date Inactivated: 06/26/2021 Target Resolution Date: 06/26/2021 Goal  Status: Unmet Unmet Reason: venous reflux Interventions: Assess patient/caregiver ability to obtain necessary supplies Assess patient/caregiver ability to perform ulcer/skin care regimen upon admission and as needed Assess ulceration(s) every visit Provide education on ulcer and skin care Treatment Activities: Topical wound management initiated : 04/17/2021 Notes: 06/08/21: Left leg wounds greater than 30% volume reduction, right leg acute infection. Electronic Signature(s) Signed: 07/12/2022 3:17:57 PM By: Adline Peals Entered By: Adline Peals on 07/12/2022 09:10:17 -------------------------------------------------------------------------------- Pain Assessment Details Patient Name: Date of Service: [name], [place] [name] 07/12/2022 9:15 A M Medical Record Number: [id number] Patient Account Number: [id number] Date of Birth/Sex: Treating RN: 07-Dec-1970 (52 y.o. F) Primary Care [place] Gustafson: [name] Other Clinician: Referring Azarya Oconnell: Treating Takeem Krotzer/Extender: Elyse Jarvis Weeks in Treatment: 63 Active Problems Location of Pain Severity and Description of Pain Patient Has Paino Yes Site Locations [place], [place] ([id number]) [id number] Page 6 of 10 Pain Management and Medication Current Pain Management: Electronic Signature(s) Signed: 07/12/2022 10:52:22 AM By: Worthy Rancher Entered By: Worthy Rancher on 07/12/2022 08:47:35 -------------------------------------------------------------------------------- Patient/Caregiver  Education Details Patient Name: Date of Service: [name], [place] [name] 2/23/2024andnbsp9:15 A M Medical Record Number: [id number] Patient Account Number: [id number] Date of Birth/Gender: Treating RN: [birth year] (52 y.o. [name] Primary Care Physician: [name] Other Clinician: Referring Physician: Treating Physician/Extender: Elyse Jarvis Weeks in Treatment:  54 Education Assessment Education Provided To: Patient Education Topics Provided Wound/Skin Impairment: Methods: Explain/Verbal Responses: Reinforcements needed, State content correctly Electronic Signature(s) Signed: 07/12/2022 3:17:57 PM By: Adline Peals Entered By: Adline Peals on 07/12/2022 09:10:31 -------------------------------------------------------------------------------- Wound Assessment Details Patient Name: Date of Service: [name], [name] 07/12/2022 9:15 A M Medical Record Number: [id number] Patient Account Number: [id number] Date of Birth/Sex: Treating RN: [birth year] (52 y.o. F) Primary Care Santosh Petter: [name] Other Clinician: Referring Vaudine Dutan: Treating Sayaka Hoeppner/Extender: Elyse Jarvis Weeks in Treatment: 64 Wound Status Wound Number: 17 Primary Etiology: Sickle Cell Lesion Wound Location: Right, Lateral Lower Leg Wound Status: Open Wounding Event: Gradually Appeared Comorbid History: Anemia, Sickle Cell Disease, Neuropathy Date Acquired: 10/05/2012 Weeks Of Treatment: 64 Clustered Wound: BRUCE, MARTINDALE ([id number]) [id number] Page 7 of 10 Photos Wound Measurements Length: (cm) 6 Width: (cm) 4.9 Depth: (cm) 0.1 Area: (cm) 23.091 Volume: (cm) 2.309 % Reduction in Area: 87.7% % Reduction in Volume: 87.7% Epithelialization: Small (1-33%) Tunneling: No Undermining: No Wound Description Classification: Full Thickness Without Exposed Support Structures Wound Margin: Distinct, outline attached Exudate Amount: Medium Exudate Type: Serosanguineous Exudate Color: red, brown Foul Odor After Cleansing: No Slough/Fibrino Yes Wound Bed Granulation Amount: Medium (34-66%) Exposed Structure Granulation Quality: Pink Fascia Exposed: No Necrotic Amount: Medium (34-66%) Fat Layer (Subcutaneous Tissue) Exposed: Yes Necrotic Quality: Adherent Slough Tendon Exposed: No Muscle Exposed:  No Joint Exposed: No Bone Exposed: No Periwound Skin Texture Texture Color No Abnormalities Noted: No No Abnormalities Noted: Yes Scarring: Yes Temperature / Pain Temperature: No Abnormality Moisture No Abnormalities Noted: Yes Treatment Notes Wound #17 (Lower Leg) Wound Laterality: Right, Lateral Cleanser Soap and Water Discharge Instruction: May shower and wash wound with dial antibacterial soap and water prior to dressing change. Wound Cleanser Discharge Instruction: Cleanse the wound with wound cleanser prior to applying a clean dressing using gauze sponges, not tissue or cotton balls. Peri-Wound Care Triamcinolone 15 (g) Discharge Instruction: Use triamcinolone 15 (g) as directed Sween Lotion (Moisturizing lotion) Discharge Instruction: Apply moisturizing lotion as directed Topical Skintegrity Hydrogel 4 (oz) Discharge Instruction: Apply hydrogel as directed Primary Dressing ADAPTIC TOUCH 3x4.25 (in/in) Discharge Instruction: Apply to wound bed as instructed Secondary Dressing ABD Pad, 5x9 Discharge Instruction: Apply over primary dressing as directed. KAJAL, BONICA ([id number]) [id number] Page 8 of 10 Secured With Elastic Bandage 4 inch (ACE bandage) Discharge Instruction: Secure with ACE bandage as directed. Compression Wrap Kerlix Roll 4.5x3.1 (in/yd) Discharge Instruction: Apply Kerlix and Coban compression as directed. Compression Stockings Add-Ons Electronic Signature(s) Signed: 07/12/2022 3:17:57 PM By: Adline Peals Entered By: Adline Peals on 07/12/2022 09:07:39 -------------------------------------------------------------------------------- Wound Assessment Details Patient Name: Date of Service: [name],  [name] 07/12/2022 9:15 A M Medical Record Number: [id number] Patient Account Number: [id number] Date of Birth/Sex: Treating RN: 1971-05-14 (52 y.o. F) Primary Care Jakarri Lesko: [name] Other  Clinician: Referring Elizzie Westergard: Treating Bray Vickerman/Extender: Elyse Jarvis Weeks in Treatment: 64 Wound Status Wound Number: 21 Primary Etiology: Sickle Cell Lesion Wound Location: Right, Medial Ankle Secondary Etiology: Venous Leg Ulcer Wounding Event: Gradually Appeared Wound Status: Open Date Acquired: 06/26/2021 Comorbid History: Anemia, Sickle Cell Disease, Neuropathy Weeks Of Treatment: 54 Clustered Wound: No Photos Wound  Measurements Length: (cm) 7 Width: (cm) 3 Depth: (cm) 0.1 Area: (cm) 16.493 Volume: (cm) 1.649 % Reduction in Area: 43.1% % Reduction in Volume: 43.1% Epithelialization: Small (1-33%) Tunneling: No Undermining: No Wound Description Classification: Full Thickness Without Exposed Support Structures Wound Margin: Distinct, outline attached Exudate Amount: Medium Exudate Type: Serosanguineous Exudate Color: red, brown Foul Odor After Cleansing: No Slough/Fibrino Yes Wound Bed Granulation Amount: Medium (34-66%) Exposed Structure Granulation Quality: Pink Fascia Exposed: No Necrotic Amount: Medium (34-66%) Fat Layer (Subcutaneous Tissue) Exposed: Yes Necrotic Quality: Adherent Slough Tendon Exposed: No Muscle Exposed: No Joint Exposed: No Bone Exposed: No [name] ([id number]) [id number] Page 9 of 10 Periwound Skin Texture Texture Color No Abnormalities Noted: No No Abnormalities Noted: Yes Scarring: Yes Temperature / Pain Temperature: No Abnormality Moisture No Abnormalities Noted: Yes Tenderness on Palpation: Yes Treatment Notes Wound #21 (Ankle) Wound Laterality: Right, Medial Cleanser Soap and Water Discharge Instruction: May shower and wash wound with dial antibacterial soap and water prior to dressing change. Wound Cleanser Discharge Instruction: Cleanse the wound with wound cleanser prior to applying a clean dressing using gauze sponges, not tissue or cotton balls. Peri-Wound  Care Triamcinolone 15 (g) Discharge Instruction: Use triamcinolone 15 (g) as directed Sween Lotion (Moisturizing lotion) Discharge Instruction: Apply moisturizing lotion as directed Topical Primary Dressing ADAPTIC TOUCH 3x4.25 (in/in) Discharge Instruction: Apply to wound bed as instructed Secondary Dressing ABD Pad, 5x9 Discharge Instruction: Apply over primary dressing as directed. Secured With Elastic Bandage 4 inch (ACE bandage) Discharge Instruction: Secure with ACE bandage as directed. Compression Wrap Kerlix Roll 4.5x3.1 (in/yd) Discharge Instruction: Apply Kerlix and Coban compression as directed. Compression Stockings Add-Ons Electronic Signature(s) Signed: 07/12/2022 3:17:57 PM By: Adline Peals Entered By: Adline Peals on 07/12/2022 09:07:58 -------------------------------------------------------------------------------- Vitals Details Patient Name: Date of Service: [name], [name] 07/12/2022 9:15 A M Medical Record Number: [id number] Patient Account Number: [id number] Date of Birth/Sex: Treating RN: Oct 11, 1970 (52 y.o. F) Primary Care Acen Craun: [name] Other Clinician: Referring Stana Bayon: Treating Natashia Roseman/Extender: Elyse Jarvis Weeks in Treatment: 91 Vital Signs Time Taken: 08:47 Temperature (F): 98.2 Height (in): 67 Pulse (bpm): 69 Weight (lbs): 134 Respiratory Rate (breaths/min): 20 Body Mass Index (BMI): 21 Blood Pressure (mmHg): 130/72 Reference Range: 80 - 120 mg / dl Electronic Signature(s) Signed: 07/12/2022 10:52:22 AM By: Worthy Rancher Entered By: Worthy Rancher on 07/12/2022 08:47:24 [name] ([id number]) [id number] Page 10 of 10

## 2022-07-13 NOTE — Progress Notes (Signed)
[name], [name] ([id number]) [id number] Page 1 of 14 Visit Report for 07/12/2022 Chief Complaint Document Details Patient Name: Date of Service: [name] 07/12/2022 9:15 A M Medical Record Number: [id number] Patient Account Number: [id number] Date of Birth/Sex: Treating RN: [name]/[birth year] (52 y.o. F) Primary Care Provider: Cammie [name] Other Clinician: Referring Provider: Treating Provider/Extender: [name] in Treatment: 42 Information Obtained from: Patient Chief Complaint the patient is here for evaluation of her bilateral lower extremity [name] cell ulcers 04/17/2021; patient comes in for substantial wounds on the right and left lower leg Electronic Signature(s) Signed: 07/12/2022 9:15:54 AM By: Fredirick Maudlin MD FACS Entered By: Fredirick Maudlin on 07/12/2022 09:15:54 -------------------------------------------------------------------------------- HPI Details Patient Name: Date of Service: [name], [name] 07/12/2022 9:15 A M Medical Record Number: [id number] Patient Account Number: [id number] Date of Birth/Sex: Treating RN: [name], [name] (52 y.o. F) Primary Care Provider: Cammie [name] Other Clinician: Referring Provider: Treating Provider/Extender: [name] in Treatment: 40 History of Present Illness Location: medial and lateral ankle region on the right and left medial malleolus Quality: Patient reports experiencing a shooting pain to affected area(s). Severity: Patient states wound(s) are getting worse. Duration: right lower extremity bimalleolar ulcers have been present for approximately 2 years; the rright meedial malleolus ulcer has been there proximally 6 months Timing: Pain in wound is constant (hurts all the time) Context: The wound would happen gradually ssociated Signs and Symptoms: Patient reports having increase discharge. A HPI Description: 52 year old patient with  a history of [name] cell anemia who was last seen by me with ulceration of the right lower extremity above the ankle and was referred to Dr. Leland Johns for a surgical debridement as I was unable to do anything in the office due to excruciating pain. At that stage she was referred from the plastic surgery service to dermatology who treated her for a skin infection with doxycycline and then Levaquin and a local antibiotic ointment. I understand the patient has since developed ulceration on the left ankle both medial and lateral and was now referred back to the wound center as dermatology has finished the management. 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 She has been visiting the wound center [facility] and was seen by Dr. Dellia Nims and Dr. Leland Johns. after prolonged conservator therapy between July 2016 and January 2017. She had been seen by the plastic surgeon and taken to the OR for debridement and application of Theraskin. She had 3 applications of Theraskin and was then treated with collagen. Prior to that she had a history of similar problems in 2014 and was treated conservatively. Had a reflux study done for the right lower extremity in August 2016 without reflux or DVT . Past medical history significant for [name] cell disease, anemia, leg ulcers, cholelithiasis,and has never been a smoker. Once the patient was discharged on the wound center she says within 2 or 3 [name] the problems recurred and she has been treating it conservatively. since I saw her 3 [name] ago at [facility] she has been unable to get her dressing material but has completed a course of doxycycline. 6/7/ 2017 -- lower extremity venous duplex reflux evaluation was done No evidence of SVT or DVT in the RLL. No  venous incompetence in the RLL. No further  vascular workup is indicated at this time. She was seen by Dr. [name], on 10/04/2015. She agreed with the plan of taking her to the OR for debridement and application of theraskin and would also take biopsies to rule out pyoderma gangrenosum. [name], [name] ([id number]) [id number] Page 2 of 14 Follow-up note dated May 31 received and she was status post application of Theraskin to multiple ulcers around the right ankle. Pathology did not show evidence of malignancy or pyoderma gangrenosum. She would continue to see as in the wound clinic for further care and see Dr. Leland Johns as needed. The patient brought the biopsy report and it was consistent with stasis ulcer no evidence of malignancy and the comment was that there was some adjacent neovascularization, fibrosis and patchy perivascular chronic inflammation. 11/15/2015 -- today we applied her first application of Theraskin 11/30/15; TheraSkin #2 12/13/2015 -- she is having a lot of pain locally and is here for possible application of a theraskin today. 01/16/2016 -- the patient has significant pain and has noticed despite in spite of all local care and oral pain medication. It is impossible to debride her in the office. 02/06/2016 -- I do not see any notes from Dr. Iran Planas( the patient has not made a call to the office know as she heard from them) and the only visit to recently was with her PCP Dr. Danella Penton -- I saw her on 01/16/2016 and prescribed 90 tablets of oxycodone 10 mg and did lab work and screening for HIV. the HIV was negative and hemoglobin was 6.3 with a WBC count of 14.9 and hematocrit of 17.8 with platelets of 561. reticulocyte count was 15.5% READMISSION: 07/10/2016- The patient is here for readmission for bilateral lower extremity ulcers in the presence of [name] cell. The bimalleolar ulcers to the right lower extremity have been present for  approximately 2 years, the left medial malleolus ulcer has been present approximately 6 months. She has followed with Dr.Thimmappa in the past and has had a total of 3 applications of Theraskin (01/2015, 09/2015, 06/17/16). She has also followed with Dr. Con Memos here in the clinic and has received 2 applications of TheraSkin (11/10/15, 11/30/15). The patient does experience chronic, and is not amenable to debridement. She had a [name] cell crisis in December 2017, prior to that has been several years. She is not currently on any antibiotic therapy and has not been treated with any recently. 07/17/2016 -- was seen by Dr. Iran Planas of plastic surgery who saw her 2 [name] postop application of Theraskin #3. She had removed her dressing and asked her to apply silver alginate on alternate days and follow-up back with the wound center. Future debridements and application of skin substitute would have to be done in the hospital due to her high risk for anesthesia. READMISSION 04/17/2021 Patient is now a 52 year old woman that we have had in this clinic for a prolonged period of time and 2016-2017 and then again for 2 visits in February 2018. At that point she had wounds on the right lower leg predominantly medial. She had also been seen by plastic surgery Dr. Leland Johns who I believe took her to the OR for operative debridement and application of TheraSkin in 2017. After she left our clinic she was followed for a very prolonged period of time in the wound care center in [facility] who then referred her ultimately to [facility] where she was seen by Dr. Vernona Rieger. Again taken her to the OR for skin grafting which apparently did  not take. She had multiple other attempts at dressings although I have not really looked over all of these notes in great detail. She has not been seen in a wound care center in about a year. She states over the last year in addition to her right lower leg she has developed wounds on the left lower leg  quite extensive. She is using Xeroform to all of these wounds without really any improvement. She also has Medicaid which does not cover wound products. The patient has had vascular work-ups in the past including most recently on 03/28/2021 showing biphasic waveforms on the right triphasic at the PTA and biphasic at the dorsalis pedis on the left. She was unable to tolerate any degree of compression to do ABIs. Unfortunately TBI's were also not done. She had venous reflux studies done in 2017. This did not show any evidence of a DVT or SVT and no venous incompetence was noted in the right leg at the time this was the only side with the wound As noted I did not look all over her old records. She apparently had a course of HBO and [facility] although I am not sure what the indication would have been. In any case she developed seizures and terminated treatment earlier. She is generally much more disabled than when we last saw her in clinic. She can no longer walk pretty much wheelchair-bound because predominantly of pain in the left hip. 04/24/2021; the patient tolerated the wraps we put on. We used Santyl and Hydrofera Blue under compression. I brought her back for a nurse visit for a change in dressing. With Medicaid we will have a hard time getting anything paid for and hence the need for compression. She arrives in clinic with all the wounds looking somewhat better in terms of surface 12/20; circumferential wound on the right from the lateral to the medial. She has open areas on the left medial and left lateral x2 on all of this with the same surface. This does not look completely healthy although she does have some epithelialization. She is not complaining of a lot of pain which is unusual for her [name] ulcers. I have not looked over her extensive records from [facility]. She had recent arterial studies and has a history of venous reflux studies I will need to look these over although I do not believe she  has significant arterial disease 2023 05/22/2021; patient's wound areas measure slightly smaller. Still a lot of drainage coming from the right we have been using Hydrofera Blue and Santyl with some improvement in the wound surfaces. 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 This was in November and showed biphasic and triphasic waveforms but she could not tolerate ABIs because of pressure and unfortunately TBI's were not done. She has not had recent venous reflux studies that I can see 1/10; not much change about the same surface area. This has a yellowish surface to it very gritty. We have been using Santyl and Hydrofera Blue for a prolonged period. Culture I did last week showed methicillin sensitive staph aureus "rare". Our intake nurse reports greenish drainage which may be the Hydrofera Blue itself 1/17; wounds are continue to measure smaller although I am not sure about the accuracy here. Especially the areas on the right are covered in what looks to be a nonviable surface although she  does have some epithelialization. Similarly she has areas on the left medial and left lateral ankle area which appear to have a better surface and perhaps are slightly smaller. We have been using Santyl and Hydrofera Blue. She cannot tolerate mechanical debridements She went for her reflux studies which showed significant reflux at the greater saphenous vein at the saphenofemoral junction as well as the greater saphenous vein in the proximal calf on the left she had reflux in the thigh and the common femoral vein and supra vein Fishel vein reflux in the greater saphenous vein. I will have vein and vascular look at this. My thoughts have been that these are likely [name] wounds. I looked through her old records from [facility] wound care center and then when she graduated to [facility] wound care center where she saw Dr. Zigmund Daniel and Dr. Vernona Rieger. Although I can see she had reflux studies done I do not see that she actually saw a vein and vascular. I went over the fact that she had operative debridements and actual skin grafting that did not take. I do not think these wounds have ever really progressed towards healing 1/31;Substantial wounds on the right ankle area. Hyper granulated very gritty adherent debris on the surface. 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 On the left she has no DVT or SVT deep vein reflux in the common femoral vein. Superficial vein reflux in the greater saphenous vein on although the vein diameter is not really all that large. I do not think there is anything that can be done with these although I am going to send her for consultation to vein and vascular. 2/7; Wound exam; substantial wound area on the right posterior ankle area and areas on the left medial ankle and left lateral ankle. I was able to debride the left medial ankle last week fairly aggressively and it is back this week to a completely nonviable surface She will see vascular surgery this Friday and I would like them to review the venous studies and also any comments on her arterial status. If they do not see an issue here I am going to refer her to plastic surgery for an operative debridement perhaps intraoperative ACell or Integra. Eventually she will require a deep [name], [name] ([id number]) [id number] Page 3 of 14 tissue culture again 2/14; substantial wound area on the right posterior  ankle, medial ankle. We have been using silver alginate The patient was seen by vein and vascular she had both venous reflux studies and arterial studies. 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 She had no evidence of superficial venous thrombosis. She did have superficial vein reflux at the saphenofemoral junction and the greater saphenous vein. On the left no evidence of a DVT no evidence of superficial venous throat thrombosis she did have deep vein reflux in the common femoral vein and superficial vein reflux in the greater saphenous vein but these were not felt to be amenable to ablation. In terms of arterial studies she had triphasic and wife  biphasic wave waveforms bilaterally not felt to have a significant arterial issue. I do not get the feeling that they felt that any part of her nonhealing wounds were related to either arterial or venous issues. They did note that she had venous reflux at the right at the [facility] and [facility]. And also on the left there were reflux in the deep system at the common femoral vein and greater saphenous vein in the proximal thigh. Nothing amenable to ablation. 2/20; she is making some decent progress on the right where there is nice skin between the 2 open areas on the right ankle. The surfaces here do not look viable yet there is some surrounding epithelialization. She still has a small area on the left medial ankle area. Hyper-granulated Jody's away always 2/28 patient has an appointment with plastic surgery on 3/8. We will see her back on 3/9. She may have to call us to get the area redressed. We've been using Santyl under silver alginate. We made a nice improvement on the left medial ankle. The larger wounds on the right also looks somewhat better in terms of epithelialization although I think they could benefit from an aggressive debridement if plastic surgery would be willing to do that.  Perhaps placement of Integra or a cell 07/26/2021: She saw Dr. Claudia Desanctis yesterday. He raised the question as to whether or not this might be pyoderma and wanted to wait until that question was answered by dermatology before proceeding with any sort of operative debridement. We have continue to use Santyl under silver alginate with Kerlix and Coban wraps. Overall, her wounds appear to be continuing to contract and epithelialize, with some granulation tissue present. There continues to be some slough on all wound surfaces. 08/09/2021: She has not been able to get an appointment with dermatology because apparently the offices in [place] do not accept Medicaid. She is looking into whether or not she can be seen at the main [facility] dermatology clinic. This is necessary because plastic surgery is concerned that her wounds might represent pyoderma and they did not want to do any procedure until that was clarified. We have been using Santyl under silver alginate with Kerlix and Coban wraps. Today, there was a greater amount of drainage on her dressings with a slight green discoloration and significant odor. Despite this, her wounds continue to contract and epithelialize. There is pale granulation tissue present and actually, on the left medial ankle, the granulation tissue is a bit hypertrophic. 08/16/2021: Last week, I took a culture and this grew back rare methicillin-resistant Staph aureus and rare corynebacterium. The MRSA was sensitive to gentamicin which we began applying topically on an empiric basis. This week, her wounds are a bit smaller and the drainage and odor are less. Her primary care provider is working on assisting the patient with a dermatology evaluation. She has been in silver alginate over the gentamicin that was started last week along with Kerlix and Coban wraps. 08/23/2021: Because she has Medicaid, we have been unable to get her into see any dermatologist in the  [place] to rule out pyoderma gangrenosum, which was a requirement from plastic surgery prior to any sort of debridement and grafting. Despite this, however, all of her wounds continue to get smaller. The wound on her left medial ankle is nearly closed. There is no odor from the wounds, although she still accumulates a modest amount of drainage on her dressings. 08/30/2021: The lateral right ankle wound and the  medial left ankle wound are a bit smaller today. The medial right ankle wound is about the same size. They are less tender. We have still been unable to get her into dermatology. 09/06/2021: All of the wounds are about the same size today. She continues to endorse minimal pain. I communicated with Dr. Claudia Desanctis in plastic surgery regarding our issues getting a dermatology appointment; he was out of town but indicated that he would look into perhaps performing the biopsy in his office and will have his office contact her. 09/14/2021: The patient has an appointment in dermatology, but it is not until October. Her wounds are roughly the same; she continues to have very thick purulent-looking drainage on her dressings. 09/20/2021: The left medial wound is nearly closed and just has a bit of accumulated eschar on the surface. The right medial and lateral ankle wounds are perhaps a little bit smaller. They continue to have a very pale surface with accumulation of thin slough. 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 I did not think [employer] was indicated based on this. She is getting topical mupirocin with Prisma silver collagen. 10/04/2021: The patient was not seen in clinic last week due to childcare coverage issues. In the interim, the left medial leg wound has closed. The right sided leg wounds are smaller. There is more granulation tissue coming through, particularly on the lateral wound. The surface remains somewhat gritty. We have been applying topical mupirocin and Prisma silver  collagen. 10/11/2021: The left medial leg wound remains closed. She does complain of some anesthetic sensation to the area. Both of the right-sided leg wounds are smaller but still have accumulated slough. 10/18/2021: Both right-sided leg wounds are minimally smaller this week. She still continues to accumulate slough and has thick drainage on her dressings. 10/23/2021: Both wounds continue to contract. There is still slough buildup. She has been approved for a keratin-based skin substitute trial product but it will not be available until next week. 10/30/2021: The wounds are about the same to perhaps slightly smaller. There is still continued slough buildup. Unfortunately, the rep for the keratin based product did not show up today and did not answer his phone when called. 11/08/2021: The wounds are little bit smaller today. She continues to have thick drainage but the surfaces are relatively clean with just a little bit of slough accumulation. She reported to me today that she is unable to completely flex her left ankle and on examination it seems this is potentially related to scar tissue from her wounds. We do have the ProgenaMatrix trial product available for her today. 11/15/2021: Both wounds are smaller today. There is some slough accumulation on the surfaces, but the medial wound, in particular looks like it is filling in and is less deep. She did hear from physical therapy and she is going to start working with them on July 11. She is here for her second application of the trial skin substitute, ProgenaMatrix. 11/22/2021: Both wounds continue to contract, the medial more dramatically than the lateral. Both wounds have a layer of slough on the surface, but underneath this, the gritty fibrous tissue has a little bit more of a pink cast to it rather than being as pale as it has been. 11/29/2021: The wounds are roughly the same size this week, perhaps a millimeter or 2 smaller. The medial wound has filled  in and is nearly flush with the surrounding skin surface. She continues to have a lot of  slough accumulation on both surfaces. 12/06/2021: No significant change to her wounds, but she has a new opening on her dorsal foot, just distal to the right lateral ankle wound. The area on her left medial ankle that reopened looks a little bit larger today. She has quite a bit of pain associated with the new wound. 12/12/2021: Her wounds look about the same but the new opening on her right lateral dorsal foot is a little bit bigger. She continues to have a fair amount of pain with this wound. 12/26/2021: The left medial ankle wound is tiny and superficial. She has 2 areas of crusting on her left lateral ankle, however, that appear to be threatening to EMMILYN, REVERON ([id number]) [id number] Page 4 of 14 open again. Her right medial ankle wound is a little bit smaller today but still continues to accumulate thick rubbery slough. The new dorsal foot wound is exquisitely painful but there is no odor or purulent drainage. No erythema or induration. The right lateral ankle wound looks about the same today, again with thick rubbery slough. 01/03/2022: The left medial ankle wound has closed again. Both right ankle wounds appear to be about the same size with thick rubbery slough. The dorsal foot wound on the right continues to be quite painful and she stated that she did not want any debridement of that site today. 01/10/2022: No real change to any of her wounds. She continues to accumulate thick slough. The dorsal foot wound has merged with the lateral malleolar wound. She is experiencing significant pain in the dorsal foot portion of the ulcer. 01/16/2022: Absolutely no change or progress in her wounds. 01/24/2022: Her wounds are unchanged. She continues to build up slough and the wounds on her dorsal right foot are still exquisitely tender. 01/31/2022: The wounds actually measure a little bit  narrower today. They still have thick slough on the surface but the underlying tissue seems a little less fibrotic. We changed to Iodosorb last week. 02/07/2022: Wounds continue to slowly epithelialize around the parameters. She has less pain today. She still accumulates a fairly substantial layer of slough. 02/15/2022: No change to the wounds overall. The measured a little bit larger today per the intake nurse. She continues to have substantial slough accumulation and her pain is a little bit worse. 02/21/2022. No change at all to her wounds. I do not see that plastic surgery has received her referral yet. 02/28/2022: The wounds remain unchanged. They are dry and fibrotic with accumulation of slough and eschar. She did receive an appointment to see plastic surgery on October 23, but the office called her back and indicated they needed to reschedule and that the next available appointment was not until December. The patient became angry and decided she did not want to see this plastics group. 10/19; the patient sees Dr. Lovett Calender again next week. She is using Medihoney as the primary dressing changing this herself. 03/21/2022: She saw Dr. Jaclynn Guarneri at the wound care center at [facility]. Dr. Jaclynn Guarneri was also unable to debride her, secondary to pain and is planning to take her to the operating room for operative debridement and potential skin substitute placement. Her wounds are unchanged with thick yellow slough and a fibrotic base. She says they are too painful to be debrided. In addition, yesterday her hemoglobin was 4 and she received 2 units packed red blood cell transfusion. 04/04/2022: Her operation at [facility] is scheduled for December 15. She continues to use Medihoney on her  wounds. They are a little bit less painful today and she is willing to entertain the possibility of debridement. The wounds measured a little bit larger in all dimensions today but the layer of slough is  not as thick as usual. 04/18/2022: Her wounds actually measured a little bit smaller today and the extension onto the dorsal foot from the lateral wound has healed. They are less tender and there is significantly less slough present as compared to prior visits. 05/09/2022: She had to reschedule her surgery due to lack of childcare. It is now planned for January 5. The wounds are basically unchanged, but she had a lot more drainage, which was thick and somewhat purulent in appearance. No significant odor. 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 They are quite painful today and she does not want to have debridement performed. 05/30/2022: The patient underwent surgical debridement and placement of TheraSkin to her wounds on January 5. She has been doing well and does not endorse any pain. The TheraSkin is intact and looks beautiful. It is sutured in place. There is no exudate or significant drainage. 06/04/2022: Her TheraSkin remains intact and I am starting to see granulation tissue emerging underneath the surface. No concern for infection. 06/12/2022: The TheraSkin is intact and there is more granulation tissue emerging. It is starting to look a little bit dry, however. No malodor or purulent drainage. 06/19/2022: The TheraSkin continues to look good. The edges are a bit dry, but the central portion of each of her wounds is showing a nice pink color. 06/27/2022: The TheraSkin on the lateral wound remains almost completely intact. There is nice pink tissue peaking through the mesh of the TheraSkin. On the medial leg, it is starting to breakdown a little bit, but most of the TheraSkin remains intact here, as well. Both wounds measured smaller today. 07/03/2022: The wounds are fairly dry around the perimeter today. Some of the suture is hanging loose. 07/12/2022: Both wounds are measuring smaller today. The medial ulcer is getting a bit too dry. The lateral ulcer continues to have nice buds of  granulation tissue emerging. Electronic Signature(s) Signed: 07/12/2022 9:16:45 AM By: Fredirick Maudlin MD FACS Entered By: Fredirick Maudlin on 07/12/2022 09:16:45 -------------------------------------------------------------------------------- Physical Exam Details Patient Name: Date of Service: [name], [name] 07/12/2022 9:15 A M Medical Record Number: [id number] Patient Account Number: [id number] Date of Birth/Sex: Treating RN: [birth year]-[name] (52 y.o. F) Primary Care Provider: Cammie [name] Other Clinician: Referring Provider: Treating Provider/Extender: [name] in Treatment: [address] Legrand Rams ([id number]) [id number] Page 5 of 14 . . . Marland Kitchen no acute distress. Respiratory Normal work of breathing on room air. Notes 07/12/2022: Both wounds are measuring smaller today. The medial ulcer is getting a bit too dry. The lateral ulcer continues to have nice buds of granulation tissue emerging. Electronic Signature(s) Signed: 07/12/2022 9:17:15 AM By: Fredirick Maudlin MD FACS Entered By: Fredirick Maudlin on 07/12/2022 09:17:14 -------------------------------------------------------------------------------- Physician Orders Details Patient Name: Date of Service: [name], [name] 07/12/2022 9:15 A M Medical Record Number: [id number] Patient Account Number: [id number] Date of Birth/Sex: Treating RN: [birth year][name] (52 y.o. 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 -  Dr. Celine Ahr - room 2 Return A Cellular or Tissue Based  Products Other Cellular or Tissue Based Products Orders/Instructions: - Theraskin applied by plastic surgeon, leave on until further notice Bathing/ Shower/ Hygiene Do not shower or bathe in tub. Edema Control - Lymphedema / SCD / Other Avoid standing for long periods of time. Exercise regularly Additional Orders / Instructions Follow Nutritious Diet Wound Treatment Wound #17 - Lower Leg Wound Laterality: Right, Lateral Cleanser: Soap and Water 2 x Per Day/30 Days Discharge Instructions: May shower and wash wound with dial antibacterial soap and water prior to dressing change. Cleanser: Wound Cleanser 2 x Per Day/30 Days Discharge Instructions: Cleanse the wound with wound cleanser prior to applying a clean dressing using gauze sponges, not tissue or cotton balls. Peri-Wound Care: Triamcinolone 15 (g) 2 x Per Day/30 Days Discharge Instructions: Use triamcinolone 15 (g) as directed Peri-Wound Care: Sween Lotion (Moisturizing lotion) 2 x Per Day/30 Days Discharge Instructions: Apply moisturizing lotion as directed Topical: Skintegrity Hydrogel 4 (oz) 2 x Per Day/30 Days Discharge Instructions: Apply hydrogel as directed Prim Dressing: ADAPTIC TOUCH 3x4.25 (in/in) 2 x Per Day/30 Days ary Discharge Instructions: Apply to wound bed as instructed Secondary Dressing: ABD Pad, 5x9 2 x Per Day/30 Days Discharge Instructions: Apply over primary dressing as directed. IMAGEAN, LAGORIO ([id number]) [id number] Page 6 of 14 Secured With: Elastic Bandage 4 inch (ACE bandage) 2 x Per Day/30 Days Discharge Instructions: Secure with ACE bandage as directed. Compression Wrap: Kerlix Roll 4.5x3.1 (in/yd) 2 x Per Day/30 Days Discharge Instructions: Apply Kerlix and Coban compression as directed. Wound #21 - Ankle Wound Laterality: Right, Medial Cleanser: Soap and Water 2 x Per Day/30 Days Discharge Instructions: May shower and wash wound with dial antibacterial soap and water  prior to dressing change. Cleanser: Wound Cleanser 2 x Per Day/30 Days Discharge Instructions: Cleanse the wound with wound cleanser prior to applying a clean dressing using gauze sponges, not tissue or cotton balls. Peri-Wound Care: Triamcinolone 15 (g) 2 x Per Day/30 Days Discharge Instructions: Use triamcinolone 15 (g) as directed Peri-Wound Care: Sween Lotion (Moisturizing lotion) 2 x Per Day/30 Days Discharge Instructions: Apply moisturizing lotion as directed Prim Dressing: ADAPTIC TOUCH 3x4.25 (in/in) 2 x Per Day/30 Days ary Discharge Instructions: Apply to wound bed as instructed Secondary Dressing: ABD Pad, 5x9 2 x Per Day/30 Days Discharge Instructions: Apply over primary dressing as directed. Secured With: Elastic Bandage 4 inch (ACE bandage) 2 x Per Day/30 Days Discharge Instructions: Secure with ACE bandage as directed. Compression Wrap: Kerlix Roll 4.5x3.1 (in/yd) 2 x Per Day/30 Days Discharge Instructions: Apply Kerlix and Coban compression as directed. Electronic Signature(s) Signed: 07/12/2022 10:39:08 AM By: Fredirick Maudlin MD FACS Entered By: Fredirick Maudlin on 07/12/2022 09:19:44 -------------------------------------------------------------------------------- Problem List Details Patient Name: Date of Service: [name], [name] 07/12/2022 9:15 A M Medical Record Number: [id number] Patient Account Number: [id number] Date of Birth/Sex: Treating RN: [name] (52 y.o. 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 04/17/2021 No Yes Inactive Problems ICD-10 Code Description Active Date Inactive Date L97.828  Non-pressure chronic ulcer of other part of left lower leg with other specified severity 04/17/2021 04/17/2021 [name] ([id number]) [id number] Page 7 of 14 Resolved Problems Electronic Signature(s) Signed: 07/12/2022  9:15:39 AM By: Fredirick Maudlin MD FACS Entered By: Fredirick Maudlin on 07/12/2022 09:15:39 -------------------------------------------------------------------------------- Progress Note Details Patient Name: Date of Service: [name], [name] 07/12/2022 9:15 A M Medical Record Number: [id number] Patient Account Number: [id number] Date of Birth/Sex: Treating RN: [name]-[birth year] (52 y.o. F) Primary Care Provider: Cammie [name] Other Clinician: Referring Provider: Treating Provider/Extender: [name] in Treatment: 54 Subjective Chief Complaint Information obtained from Patient the patient is here for evaluation of her bilateral lower extremity [name] cell ulcers 04/17/2021; patient comes in for substantial wounds on the right and left lower leg History of Present Illness (HPI) The following HPI elements were documented for the patient's wound: Location: medial and lateral ankle region on the right and left medial malleolus Quality: Patient reports experiencing a shooting pain to affected area(s). Severity: Patient states wound(s) are getting worse. Duration: right lower extremity bimalleolar ulcers have been present for approximately 2 years; the rright meedial malleolus ulcer has been there proximally 6 months Timing: Pain in wound is constant (hurts all the time) Context: The wound would happen gradually Associated Signs and Symptoms: Patient reports having increase discharge. 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 Leland Johns for a surgical debridement as I was unable to do anything in  the office due to excruciating pain. At that stage she was referred from the plastic surgery service to dermatology who treated her for a skin infection with doxycycline and then Levaquin and a local antibiotic ointment. I understand the patient has since developed ulceration on the left ankle both medial and lateral and was now referred back to the wound center as dermatology has finished the management. 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 She has been visiting the wound center [facility] and was seen by Dr. Dellia Nims and Dr. Leland Johns. after prolonged conservator therapy between July 2016 and January 2017. She had been seen by the plastic surgeon and taken to the OR for debridement and application of Theraskin. She had 3 applications of Theraskin and was then treated with collagen. Prior to that she had a history of similar problems in 2014 and was treated conservatively. Had a reflux study done for the right lower extremity in August 2016 without reflux or DVT . Past medical history significant for [name] cell disease, anemia, leg ulcers, cholelithiasis,and has never been a smoker. Once the patient was discharged on the wound center she says within 2 or 3 [name] the problems recurred and she has been treating it conservatively. since I saw her 3 [name] ago at [facility] she has been unable to get her dressing material but has completed a course of doxycycline. 6/7/ 2017 -- lower extremity venous duplex reflux evaluation was done oo No evidence of SVT or DVT in the RLL. No venous incompetence in the RLL. No further vascular workup is indicated at this time. She was seen by Dr. [name], on 10/04/2015. She agreed with the plan of taking her to the OR for debridement and  application of theraskin and would also take biopsies to rule out pyoderma gangrenosum. Follow-up note dated May 31 received and she was status post application of Theraskin to multiple  ulcers around the right ankle. Pathology did not show evidence of malignancy or pyoderma gangrenosum. She would continue to see as in the wound clinic for further care and see Dr. Leland Johns as needed. The patient brought the biopsy report and it was consistent with stasis ulcer no evidence of malignancy and the comment was that there was some adjacent neovascularization, fibrosis and patchy perivascular chronic inflammation. 11/15/2015 -- today we applied her first application of Theraskin 11/30/15; TheraSkin #2 12/13/2015 -- she is having a lot of pain locally and is here for possible application of a theraskin today. 01/16/2016 -- the patient has significant pain and has noticed despite in spite of all local care and oral pain medication. It is impossible to debride her in the office. 02/06/2016 -- I do not see any notes from Dr. Iran Planas( the patient has not made a call to the office know as she heard from them) and the only visit to recently was with her PCP Dr. Danella Penton -- I saw her on 01/16/2016 and prescribed 90 tablets of oxycodone 10 mg and did lab work and screening for HIV. the HIV was negative and hemoglobin was 6.3 with a WBC count of 14.9 and hematocrit of 17.8 with platelets of 561. reticulocyte count was 15.5% READMISSION: 07/10/2016- The patient is here for readmission for bilateral lower extremity ulcers in the presence of [name] cell. The bimalleolar ulcers to the right lower extremity have been present for approximately 2 years, the left medial malleolus ulcer has been present approximately 6 months. She has followed with Dr.Thimmappa in the past and has had a total of 3 applications of Theraskin (01/2015, 09/2015, 06/17/16). She has also followed with Dr. Con Memos here in the clinic and has  received 2 applications of TheraSkin (11/10/15, 11/30/15). The patient does experience chronic, and is not amenable to debridement. She had a [name] cell [name], [name] ([id number]) [id number] Page 8 of 14 crisis in December 2017, prior to that has been several years. She is not currently on any antibiotic therapy and has not been treated with any recently. 07/17/2016 -- was seen by Dr. Iran Planas of plastic surgery who saw her 2 [name] postop application of Theraskin #3. She had removed her dressing and asked her to apply silver alginate on alternate days and follow-up back with the wound center. Future debridements and application of skin substitute would have to be done in the hospital due to her high risk for anesthesia. READMISSION 04/17/2021 Patient is now a 52 year old woman that we have had in this clinic for a prolonged period of time and 2016-2017 and then again for 2 visits in February 2018. At that point she had wounds on the right lower leg predominantly medial. She had also been seen by plastic surgery Dr. Leland Johns who I believe took her to the OR for operative debridement and application of TheraSkin in 2017. After she left our clinic she was followed for a very prolonged period of time in the wound care center in [facility] who then referred her ultimately to [facility] where she was seen by Dr. Vernona Rieger. Again taken her to the OR for skin grafting which apparently did not take. She had multiple other attempts at dressings although I have not really looked over all of these notes in great detail. She has not been seen in a wound care center in about a year. She states over the last year in addition to her right lower leg she has developed wounds on the left lower leg  quite extensive. She is using Xeroform to all of these wounds without really any improvement. She also has Medicaid which does not cover wound products. The patient has had vascular work-ups in the past  including most recently on 03/28/2021 showing biphasic waveforms on the right triphasic at the PTA and biphasic at the dorsalis pedis on the left. She was unable to tolerate any degree of compression to do ABIs. Unfortunately TBI's were also not done. She had venous reflux studies done in 2017. This did not show any evidence of a DVT or SVT and no venous incompetence was noted in the right leg at the time this was the only side with the wound As noted I did not look all over her old records. She apparently had a course of HBO and [facility] although I am not sure what the indication would have been. In any case she developed seizures and terminated treatment earlier. She is generally much more disabled than when we last saw her in clinic. She can no longer walk pretty much wheelchair-bound because predominantly of pain in the left hip. 04/24/2021; the patient tolerated the wraps we put on. We used Santyl and Hydrofera Blue under compression. I brought her back for a nurse visit for a change in dressing. With Medicaid we will have a hard time getting anything paid for and hence the need for compression. She arrives in clinic with all the wounds looking somewhat better in terms of surface 12/20; circumferential wound on the right from the lateral to the medial. She has open areas on the left medial and left lateral x2 on all of this with the same surface. This does not look completely healthy although she does have some epithelialization. She is not complaining of a lot of pain which is unusual for her [name] ulcers. I have not looked over her extensive records from [facility]. She had recent arterial studies and has a history of venous reflux studies I will need to look these over although I do not believe she has significant arterial disease 2023 05/22/2021; patient's wound areas measure slightly smaller. Still a lot of drainage coming from the right we have been using Hydrofera Blue and Santyl with  some improvement in the wound surfaces. 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 This was in November and showed biphasic and triphasic waveforms but she could not tolerate ABIs because of pressure and unfortunately TBI's were not done. She has not had recent venous reflux studies that I can see 1/10; not much change about the same surface area. This has a yellowish surface to it very gritty. We have been using Santyl and Hydrofera Blue for a prolonged period. Culture I did last week showed methicillin sensitive staph aureus "rare". Our intake nurse reports greenish drainage which may be the Hydrofera Blue itself 1/17; wounds are continue to measure smaller although I am not sure about the accuracy here. Especially the areas on the right are covered in what looks to be a nonviable surface although she does have some epithelialization. Similarly she has areas on the left medial and left lateral ankle area which appear to have a better surface and perhaps are slightly smaller. We have been using Santyl and Hydrofera Blue. She cannot tolerate mechanical debridements She went for her reflux studies which showed significant reflux at the greater saphenous vein at the  saphenofemoral junction as well as the greater saphenous vein in the proximal calf on the left she had reflux in the thigh and the common femoral vein and supra vein Fishel vein reflux in the greater saphenous vein. I will have vein and vascular look at this. My thoughts have been that these are likely [name] wounds. I looked through her old records from [facility] wound care center and then when she graduated to [facility] wound care center where she saw Dr. Zigmund Daniel and Dr. Vernona Rieger. Although I can see she had reflux studies done I do not see that she actually saw a vein and vascular. I went over the fact  that she had operative debridements and actual skin grafting that did not take. I do not think these wounds have ever really progressed towards healing 1/31;Substantial wounds on the right ankle area. Hyper granulated very gritty adherent debris on the surface. 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 On the left she has no DVT or SVT deep vein reflux in the common femoral vein. Superficial vein reflux in the greater saphenous vein on although the vein diameter is not really all that large. I do not think there is anything that can be done with these although I am going to send her for consultation to vein and vascular. 2/7; Wound exam; substantial wound area on the right posterior ankle area and areas on the left medial ankle and left lateral ankle. I was able to debride the left medial ankle last week fairly aggressively and it is back this week to a completely nonviable surface She will see vascular surgery this Friday and I would like them to review the venous studies and also any comments on her arterial status. If they do not see an issue here I am going to refer her to plastic surgery for an operative debridement perhaps intraoperative ACell or Integra. Eventually she will require a deep tissue culture again 2/14; substantial wound area on the right posterior ankle, medial ankle. We have been using silver alginate The patient was seen by vein and vascular she had both venous reflux studies and arterial studies. 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 She had  no evidence of superficial venous thrombosis. She did have superficial vein reflux at the saphenofemoral junction and the greater saphenous vein. On the left no evidence of a DVT no evidence of superficial venous throat thrombosis she did have deep vein reflux in the common femoral vein and superficial vein reflux in the greater saphenous vein but these were not felt to be amenable to ablation. In terms of arterial studies she had triphasic and wife biphasic wave waveforms bilaterally not felt to have a significant arterial issue. I do not get the feeling that they felt that any part of her nonhealing wounds were related to either arterial or venous issues. They did note that she had venous reflux at the right at the [facility] and [facility]. And also on the left there were reflux in the deep system at the common  femoral vein and greater saphenous vein in the proximal thigh. Nothing amenable to ablation. 2/20; she is making some decent progress on the right where there is nice skin between the 2 open areas on the right ankle. The surfaces here do not look viable yet there is some surrounding epithelialization. She still has a small area on the left medial ankle area. Hyper-granulated Jody's away always 2/28 patient has an appointment with plastic surgery on 3/8. We will see her back on 3/9. She may have to call us to get the area redressed. We've been using Santyl under silver alginate. We made a nice improvement on the left medial ankle. The larger wounds on the right also looks somewhat [name], [name] ([id number]) [id number] Page 9 of 14 better in terms of epithelialization although I think they could benefit from an aggressive debridement if plastic surgery would be willing to do that. Perhaps placement of Integra or a cell 07/26/2021: She saw Dr. Claudia Desanctis yesterday. He raised the question as to whether or not this might be pyoderma and wanted to wait until that question was answered by  dermatology before proceeding with any sort of operative debridement. We have continue to use Santyl under silver alginate with Kerlix and Coban wraps. Overall, her wounds appear to be continuing to contract and epithelialize, with some granulation tissue present. There continues to be some slough on all wound surfaces. 08/09/2021: She has not been able to get an appointment with dermatology because apparently the offices in [place] do not accept Medicaid. She is looking into whether or not she can be seen at the main [facility] dermatology clinic. This is necessary because plastic surgery is concerned that her wounds might represent pyoderma and they did not want to do any procedure until that was clarified. We have been using Santyl under silver alginate with Kerlix and Coban wraps. Today, there was a greater amount of drainage on her dressings with a slight green discoloration and significant odor. Despite this, her wounds continue to contract and epithelialize. There is pale granulation tissue present and actually, on the left medial ankle, the granulation tissue is a bit hypertrophic. 08/16/2021: Last week, I took a culture and this grew back rare methicillin-resistant Staph aureus and rare corynebacterium. The MRSA was sensitive to gentamicin which we began applying topically on an empiric basis. This week, her wounds are a bit smaller and the drainage and odor are less. Her primary care provider is working on assisting the patient with a dermatology evaluation. She has been in silver alginate over the gentamicin that was started last week along with Kerlix and Coban wraps. 08/23/2021: Because she has Medicaid, we have been unable to get her into see any dermatologist in the [place] to rule out pyoderma gangrenosum, which was a requirement from plastic surgery prior to any sort of debridement and grafting. Despite this, however, all of her wounds continue to get smaller. The  wound on her left medial ankle is nearly closed. There is no odor from the wounds, although she still accumulates a modest amount of drainage on her dressings. 08/30/2021: The lateral right ankle wound and the medial left ankle wound are a bit smaller today. The medial right ankle wound is about the same size. They are less tender. We have still been unable to get her into dermatology. 09/06/2021: All of the wounds are about the same size today. She continues to endorse minimal pain. I communicated with Dr. Claudia Desanctis in plastic surgery regarding  our issues getting a dermatology appointment; he was out of town but indicated that he would look into perhaps performing the biopsy in his office and will have his office contact her. 09/14/2021: The patient has an appointment in dermatology, but it is not until October. Her wounds are roughly the same; she continues to have very thick purulent-looking drainage on her dressings. 09/20/2021: The left medial wound is nearly closed and just has a bit of accumulated eschar on the surface. The right medial and lateral ankle wounds are perhaps a little bit smaller. They continue to have a very pale surface with accumulation of thin slough. 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 I did not think [employer] was indicated based on this. She is getting topical mupirocin with Prisma silver collagen. 10/04/2021: The patient was not seen in clinic last week due to childcare coverage issues. In the interim, the left medial leg wound has closed. The right sided leg wounds are smaller. There is more granulation tissue coming through, particularly on the lateral wound. The surface remains somewhat gritty. We have been applying topical mupirocin and Prisma silver collagen. 10/11/2021: The left medial leg wound remains closed. She does complain of some anesthetic sensation to the area. Both of the right-sided leg wounds are smaller but still have accumulated  slough. 10/18/2021: Both right-sided leg wounds are minimally smaller this week. She still continues to accumulate slough and has thick drainage on her dressings. 10/23/2021: Both wounds continue to contract. There is still slough buildup. She has been approved for a keratin-based skin substitute trial product but it will not be available until next week. 10/30/2021: The wounds are about the same to perhaps slightly smaller. There is still continued slough buildup. Unfortunately, the rep for the keratin based product did not show up today and did not answer his phone when called. 11/08/2021: The wounds are little bit smaller today. She continues to have thick drainage but the surfaces are relatively clean with just a little bit of slough accumulation. She reported to me today that she is unable to completely flex her left ankle and on examination it seems this is potentially related to scar tissue from her wounds. We do have the ProgenaMatrix trial product available for her today. 11/15/2021: Both wounds are smaller today. There is some slough accumulation on the surfaces, but the medial wound, in particular looks like it is filling in and is less deep. She did hear from physical therapy and she is going to start working with them on July 11. She is here for her second application of the trial skin substitute, ProgenaMatrix. 11/22/2021: Both wounds continue to contract, the medial more dramatically than the lateral. Both wounds have a layer of slough on the surface, but underneath this, the gritty fibrous tissue has a little bit more of a pink cast to it rather than being as pale as it has been. 11/29/2021: The wounds are roughly the same size this week, perhaps a millimeter or 2 smaller. The medial wound has filled in and is nearly flush with the surrounding skin surface. She continues to have a lot of slough accumulation on both surfaces. 12/06/2021: No significant change to her wounds, but she has a new  opening on her dorsal foot, just distal to the right lateral ankle wound. The area on her left medial ankle that reopened looks a little bit larger today. She has quite a bit of pain associated with the new wound. 12/12/2021: Her  wounds look about the same but the new opening on her right lateral dorsal foot is a little bit bigger. She continues to have a fair amount of pain with this wound. 12/26/2021: The left medial ankle wound is tiny and superficial. She has 2 areas of crusting on her left lateral ankle, however, that appear to be threatening to open again. Her right medial ankle wound is a little bit smaller today but still continues to accumulate thick rubbery slough. The new dorsal foot wound is exquisitely painful but there is no odor or purulent drainage. No erythema or induration. The right lateral ankle wound looks about the same today, again with thick rubbery slough. 01/03/2022: The left medial ankle wound has closed again. Both right ankle wounds appear to be about the same size with thick rubbery slough. The dorsal foot wound on the right continues to be quite painful and she stated that she did not want any debridement of that site today. 01/10/2022: No real change to any of her wounds. She continues to accumulate thick slough. The dorsal foot wound has merged with the lateral malleolar wound. She is experiencing significant pain in the dorsal foot portion of the ulcer. 01/16/2022: Absolutely no change or progress in her wounds. 01/24/2022: Her wounds are unchanged. She continues to build up slough and the wounds on her dorsal right foot are still exquisitely tender. 01/31/2022: The wounds actually measure a little bit narrower today. They still have thick slough on the surface but the underlying tissue seems a little less fibrotic. We changed to Iodosorb last week. 02/07/2022: Wounds continue to slowly epithelialize around the parameters. She has less pain today. She still accumulates a  fairly substantial layer of slough. [name], [name] ([id number]) [id number] Page 10 of 14 02/15/2022: No change to the wounds overall. The measured a little bit larger today per the intake nurse. She continues to have substantial slough accumulation and her pain is a little bit worse. 02/21/2022. No change at all to her wounds. I do not see that plastic surgery has received her referral yet. 02/28/2022: The wounds remain unchanged. They are dry and fibrotic with accumulation of slough and eschar. She did receive an appointment to see plastic surgery on October 23, but the office called her back and indicated they needed to reschedule and that the next available appointment was not until December. The patient became angry and decided she did not want to see this plastics group. 10/19; the patient sees Dr. Lovett Calender again next week. She is using Medihoney as the primary dressing changing this herself. 03/21/2022: She saw Dr. Jaclynn Guarneri at the wound care center at [facility]. Dr. Jaclynn Guarneri was also unable to debride her, secondary to pain and is planning to take her to the operating room for operative debridement and potential skin substitute placement. Her wounds are unchanged with thick yellow slough and a fibrotic base. She says they are too painful to be debrided. In addition, yesterday her hemoglobin was 4 and she received 2 units packed red blood cell transfusion. 04/04/2022: Her operation at [facility] is scheduled for December 15. She continues to use Medihoney on her wounds. They are a little bit less painful today and she is willing to entertain the possibility of debridement. The wounds measured a little bit larger in all dimensions today but the layer of slough is not as thick as usual. 04/18/2022: Her wounds actually measured a little bit smaller today and the extension onto the dorsal foot from  the lateral wound has healed. They are less tender  and there is significantly less slough present as compared to prior visits. 05/09/2022: She had to reschedule her surgery due to lack of childcare. It is now planned for January 5. The wounds are basically unchanged, but she had a lot more drainage, which was thick and somewhat purulent in appearance. No significant odor. 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 They are quite painful today and she does not want to have debridement performed. 05/30/2022: The patient underwent surgical debridement and placement of TheraSkin to her wounds on January 5. She has been doing well and does not endorse any pain. The TheraSkin is intact and looks beautiful. It is sutured in place. There is no exudate or significant drainage. 06/04/2022: Her TheraSkin remains intact and I am starting to see granulation tissue emerging underneath the surface. No concern for infection. 06/12/2022: The TheraSkin is intact and there is more granulation tissue emerging. It is starting to look a little bit dry, however. No malodor or purulent drainage. 06/19/2022: The TheraSkin continues to look good. The edges are a bit dry, but the central portion of each of her wounds is showing a nice pink color. 06/27/2022: The TheraSkin on the lateral wound remains almost completely intact. There is nice pink tissue peaking through the mesh of the TheraSkin. On the medial leg, it is starting to breakdown a little bit, but most of the TheraSkin remains intact here, as well. Both wounds measured smaller today. 07/03/2022: The wounds are fairly dry around the perimeter today. Some of the suture is hanging loose. 07/12/2022: Both wounds are measuring smaller today. The medial ulcer is getting a bit too dry. The lateral ulcer continues to have nice buds of granulation tissue emerging. Patient History Information obtained from Patient. Family History Diabetes - Mother, Lung Disease - Mother, No family history of Cancer, Heart Disease,  Hereditary Spherocytosis, Hypertension, Kidney Disease, Seizures, Stroke, Thyroid Problems, Tuberculosis. Social History Never smoker, Marital Status - Married, Alcohol Use - Never, Drug Use - No History, Caffeine Use - Daily. Medical History Eyes Denies history of Cataracts, Glaucoma, Optic Neuritis Ear/Nose/Mouth/Throat Denies history of Chronic sinus problems/congestion, Middle ear problems Hematologic/Lymphatic Patient has history of Anemia, [name] Cell Disease Denies history of Hemophilia, Human Immunodeficiency Virus, Lymphedema Respiratory Denies history of Aspiration, Asthma, Chronic Obstructive Pulmonary Disease (COPD), Pneumothorax, Sleep Apnea, Tuberculosis Cardiovascular Denies history of Angina, Arrhythmia, Congestive Heart Failure, Coronary Artery Disease, Deep Vein Thrombosis, Hypertension, Hypotension, Myocardial Infarction, Peripheral Arterial Disease, Peripheral Venous Disease, Phlebitis, Vasculitis Gastrointestinal Denies history of Cirrhosis , Colitis, Crohnoos, Hepatitis A, Hepatitis B, Hepatitis C Endocrine Denies history of Type I Diabetes, Type II Diabetes Genitourinary Denies history of End Stage Renal Disease Immunological Denies history of Lupus Erythematosus, Raynaudoos, Scleroderma Integumentary (Skin) Denies history of History of Burn Musculoskeletal Denies history of Gout, Rheumatoid Arthritis, Osteoarthritis, Osteomyelitis Neurologic Patient has history of Neuropathy - right foot intermittant Denies history of Dementia, Quadriplegia, Paraplegia, Seizure Disorder Oncologic Denies history of Received Chemotherapy, Received Radiation Psychiatric Denies history of Anorexia/bulimia, Confinement Anxiety Hospitalization/Surgery History - c section x2. - left breast lumpectomy. - iandD right ankle with theraskin. Medical A Surgical History Notes nd [name], [name] ([id number]) [id number] Page 11 of 14 Constitutional Symptoms  (General Health) H/O miscarriage Cardiovascular bradycardia Gastrointestinal cholilithiasis Objective Constitutional no acute distress. Vitals Time Taken: 8:47 AM, Height: 67 in, Weight: 134 lbs, BMI: 21, Temperature: 98.2 F, Pulse: 69 bpm, Respiratory Rate: 20 breaths/min, Blood Pressure: 130/72 mmHg. Respiratory  Normal work of breathing on room air. General Notes: 07/12/2022: Both wounds are measuring smaller today. The medial ulcer is getting a bit too dry. The lateral ulcer continues to have nice buds of granulation tissue emerging. Integumentary (Hair, Skin) Wound #17 status is Open. Original cause of wound was Gradually Appeared. The date acquired was: 10/05/2012. The wound has been in treatment 64 [name]. The wound is located on the Right,Lateral Lower Leg. The wound measures 6cm length x 4.9cm width x 0.1cm depth; 23.091cm^2 area and 2.309cm^3 volume. There is Fat Layer (Subcutaneous Tissue) exposed. There is no tunneling or undermining noted. There is a medium amount of serosanguineous drainage noted. The wound margin is distinct with the outline attached to the wound base. There is medium (34-66%) pink granulation within the wound bed. There is a medium (34-66%) amount of necrotic tissue within the wound bed including Adherent Slough. The periwound skin appearance had no abnormalities noted for moisture. The periwound skin appearance had no abnormalities noted for color. The periwound skin appearance exhibited: Scarring. Periwound temperature was noted as No Abnormality. Wound #21 status is Open. Original cause of wound was Gradually Appeared. The date acquired was: 06/26/2021. The wound has been in treatment 54 [name]. The wound is located on the Right,Medial Ankle. The wound measures 7cm length x 3cm width x 0.1cm depth; 16.493cm^2 area and 1.649cm^3 volume. There is Fat Layer (Subcutaneous Tissue) exposed. There is no tunneling or undermining noted. There is a medium amount of  serosanguineous drainage noted. The wound margin is distinct with the outline attached to the wound base. There is medium (34-66%) pink granulation within the wound bed. There is a medium (34- 66%) amount of necrotic tissue within the wound bed including Adherent Slough. The periwound skin appearance had no abnormalities noted for moisture. The periwound skin appearance had no abnormalities noted for color. The periwound skin appearance exhibited: Scarring. Periwound temperature was noted as No Abnormality. The periwound has tenderness on palpation. 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 - Dr. Celine Ahr - room 2 Cellular or Tissue Based Products: Other Cellular or Tissue Based Products Orders/Instructions: - Theraskin applied by Psychiatric nurse, leave on until further notice Bathing/ Shower/ Hygiene: Do not shower or bathe in tub. Edema Control - Lymphedema / SCD / Other: Avoid standing for long periods of time. Exercise regularly Additional Orders / Instructions: Follow Nutritious Diet WOUND #17: - Lower Leg Wound Laterality: Right, Lateral Cleanser: Soap and Water 2 x Per Day/30 Days Discharge Instructions: May shower and wash wound with dial antibacterial soap and water prior to dressing change. Cleanser: Wound Cleanser 2 x Per Day/30 Days Discharge Instructions: Cleanse the wound with wound cleanser prior to applying a clean dressing using gauze sponges, not tissue or cotton balls. Peri-Wound Care: Triamcinolone 15 (g) 2 x Per Day/30 Days Discharge Instructions: Use triamcinolone 15 (g) as directed Peri-Wound Care: Sween Lotion (Moisturizing lotion) 2 x Per Day/30 Days Discharge Instructions: Apply moisturizing lotion as directed [name], JARZABEK ([id number]) [id number] Page 12 of  14 Topical: Skintegrity Hydrogel 4 (oz) 2 x Per Day/30 Days Discharge Instructions: Apply hydrogel as directed Prim Dressing: ADAPTIC TOUCH 3x4.25 (in/in) 2 x Per Day/30 Days ary Discharge Instructions: Apply to wound bed as instructed Secondary Dressing: ABD Pad, 5x9 2 x Per Day/30 Days Discharge Instructions: Apply over primary dressing as directed. Secured With: Elastic Bandage 4 inch (  ACE bandage) 2 x Per Day/30 Days Discharge Instructions: Secure with ACE bandage as directed. Com pression Wrap: Kerlix Roll 4.5x3.1 (in/yd) 2 x Per Day/30 Days Discharge Instructions: Apply Kerlix and Coban compression as directed. WOUND #21: - Ankle Wound Laterality: Right, Medial Cleanser: Soap and Water 2 x Per Day/30 Days Discharge Instructions: May shower and wash wound with dial antibacterial soap and water prior to dressing change. Cleanser: Wound Cleanser 2 x Per Day/30 Days Discharge Instructions: Cleanse the wound with wound cleanser prior to applying a clean dressing using gauze sponges, not tissue or cotton balls. Peri-Wound Care: Triamcinolone 15 (g) 2 x Per Day/30 Days Discharge Instructions: Use triamcinolone 15 (g) as directed Peri-Wound Care: Sween Lotion (Moisturizing lotion) 2 x Per Day/30 Days Discharge Instructions: Apply moisturizing lotion as directed Prim Dressing: ADAPTIC TOUCH 3x4.25 (in/in) 2 x Per Day/30 Days ary Discharge Instructions: Apply to wound bed as instructed Secondary Dressing: ABD Pad, 5x9 2 x Per Day/30 Days Discharge Instructions: Apply over primary dressing as directed. Secured With: Elastic Bandage 4 inch (ACE bandage) 2 x Per Day/30 Days Discharge Instructions: Secure with ACE bandage as directed. Com pression Wrap: Kerlix Roll 4.5x3.1 (in/yd) 2 x Per Day/30 Days Discharge Instructions: Apply Kerlix and Coban compression as directed. 07/12/2022: Both wounds are measuring smaller today. The medial ulcer is getting a bit too dry. The lateral ulcer continues to  have nice buds of granulation tissue emerging. No debridement was required today. I have asked her to apply the hydrogel twice a day to her wounds to try and avoid further desiccation. Follow-up in 1 week. Electronic Signature(s) Signed: 07/12/2022 9:20:33 AM By: Fredirick Maudlin MD FACS Entered By: Fredirick Maudlin on 07/12/2022 09:20:33 -------------------------------------------------------------------------------- HxROS Details Patient Name: Date of Service: [name], [name] 07/12/2022 9:15 A M Medical Record Number: [id number] Patient Account Number: [id number] Date of Birth/Sex: Treating RN: [birth year]-[name] (52 y.o. F) Primary Care Provider: Cammie [name] Other Clinician: Referring Provider: Treating Provider/Extender: [name] in Treatment: 91 Information Obtained From Patient Constitutional Symptoms (General Health) Medical History: Past Medical History Notes: H/O miscarriage Eyes Medical History: Negative for: Cataracts; Glaucoma; Optic Neuritis Ear/Nose/Mouth/Throat Medical History: Negative for: Chronic sinus problems/congestion; Middle ear problems Hematologic/Lymphatic Medical HistoryVIKY, CORPE ([id number]) [id number] Page 13 of 14 Positive for: Anemia; [name] Cell Disease Negative for: Hemophilia; Human Immunodeficiency Virus; Lymphedema Respiratory Medical History: Negative for: Aspiration; Asthma; Chronic Obstructive Pulmonary Disease (COPD); Pneumothorax; Sleep Apnea; Tuberculosis Cardiovascular Medical History: Negative for: Angina; Arrhythmia; Congestive Heart Failure; Coronary Artery Disease; Deep Vein Thrombosis; Hypertension; Hypotension; Myocardial Infarction; Peripheral Arterial Disease; Peripheral Venous Disease; Phlebitis; Vasculitis Past Medical History Notes: bradycardia Gastrointestinal Medical History: Negative for: Cirrhosis ; Colitis; Crohns; Hepatitis A; Hepatitis B; Hepatitis  C Past Medical History Notes: cholilithiasis Endocrine Medical History: Negative for: Type I Diabetes; Type II Diabetes Genitourinary Medical History: Negative for: End Stage Renal Disease Immunological Medical History: Negative for: Lupus Erythematosus; Raynauds; Scleroderma Integumentary (Skin) Medical History: Negative for: History of Burn Musculoskeletal Medical History: Negative for: Gout; Rheumatoid Arthritis; Osteoarthritis; Osteomyelitis Neurologic Medical History: Positive for: Neuropathy - right foot intermittant Negative for: Dementia; Quadriplegia; Paraplegia; Seizure Disorder Oncologic Medical History: Negative for: Received Chemotherapy; Received Radiation Psychiatric Medical History: Negative for: Anorexia/bulimia; Confinement Anxiety Immunizations Pneumococcal Vaccine: Received Pneumococcal Vaccination: No Implantable Devices None Hospitalization / Surgery History Type of Hospitalization/Surgery c section x2 left breast lumpectomy iandD right ankle with theraskin [name] ([id number]) [id number] Page 14 of 51 Family and Social History Cancer: No; Diabetes:  Yes - Mother; Heart Disease: No; Hereditary Spherocytosis: No; Hypertension: No; Kidney Disease: No; Lung Disease: Yes - Mother; Seizures: No; Stroke: No; Thyroid Problems: No; Tuberculosis: No; Never smoker; Marital Status - Married; Alcohol Use: Never; Drug Use: No History; Caffeine Use: Daily; Financial Concerns: No; Food, Clothing or Shelter Needs: No; Support System Lacking: No; Transportation Concerns: No Electronic Signature(s) Signed: 07/12/2022 10:39:08 AM By: Fredirick Maudlin MD FACS Entered By: Fredirick Maudlin on 07/12/2022 09:16:52 -------------------------------------------------------------------------------- SuperBill Details Patient Name: Date of Service: [name] [name], [name] 07/12/2022 Medical Record Number: [id number] Patient Account Number:  [id number] Date of Birth/Sex: Treating RN: [name] (52 y.o. 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

## 2022-07-19 ENCOUNTER — Telehealth: Payer: Self-pay | Admitting: Family Medicine

## 2022-07-19 ENCOUNTER — Encounter (HOSPITAL_BASED_OUTPATIENT_CLINIC_OR_DEPARTMENT_OTHER): Payer: Medicaid Other | Admitting: General Surgery

## 2022-07-19 NOTE — Telephone Encounter (Signed)
Caller & Relationship to patient:  MRN #  [id number]   Call Back Number:   Date of Last Office Visit: 06/18/2022     Date of Next Office Visit: 09/24/2022    Medication(s) to be Refilled: Oxycodone / Ibuprofen    Preferred Pharmacy:   ** Please notify patient to allow 48-72 hours to process** **Let patient know to contact pharmacy at the end of the day to make sure medication is ready. ** **If patient has not been seen in a year or longer, book an appointment **Advise to use MyChart for refill requests OR to contact their pharmacy

## 2022-07-23 ENCOUNTER — Other Ambulatory Visit: Payer: Self-pay | Admitting: Family Medicine

## 2022-07-23 DIAGNOSIS — G894 Chronic pain syndrome: Secondary | ICD-10-CM

## 2022-07-23 MED ORDER — OXYCODONE HCL 10 MG PO TABS: 10.0000 mg | ORAL_TABLET | ORAL | 0 refills | Status: DC

## 2022-07-23 NOTE — Progress Notes (Signed)
Reviewed PDMP substance reporting system prior to prescribing opiate medications. No inconsistencies noted.  Meds ordered this encounter  Medications   Oxycodone HCl 10 MG TABS    Sig: Take 1 tablet (10 mg total) by mouth every 4 (four) hours while awake.    Dispense:  90 tablet    Refill:  0    Order Specific Question:   Supervising Provider    Answer:   Tresa Garter [id number]   [name]  APRN, MSN, FNP-C [facility] [address] [place], [place] [address] [phone]

## 2022-07-26 ENCOUNTER — Encounter (HOSPITAL_BASED_OUTPATIENT_CLINIC_OR_DEPARTMENT_OTHER): Payer: Medicaid Other | Attending: General Surgery | Admitting: General Surgery

## 2022-07-26 DIAGNOSIS — Z993 Dependence on wheelchair: Secondary | ICD-10-CM | POA: Insufficient documentation

## 2022-07-26 DIAGNOSIS — L97818 Non-pressure chronic ulcer of other part of right lower leg with other specified severity: Secondary | ICD-10-CM | POA: Insufficient documentation

## 2022-07-26 DIAGNOSIS — Z86718 Personal history of other venous thrombosis and embolism: Secondary | ICD-10-CM | POA: Diagnosis not present

## 2022-07-26 DIAGNOSIS — D571 Sickle-cell disease without crisis: Secondary | ICD-10-CM | POA: Diagnosis not present

## 2022-07-26 DIAGNOSIS — L97822 Non-pressure chronic ulcer of other part of left lower leg with fat layer exposed: Secondary | ICD-10-CM | POA: Diagnosis not present

## 2022-07-28 NOTE — Progress Notes (Signed)
CORAL, RAIS ([id number].pdf Page 1 of 8 Visit Report for 07/26/2022 Arrival Information Details Patient Name: Date of Service: [name] [name] 07/26/2022 9:15 A M Medical Record Number: [id number] Patient Account Number: [id number] Date of Birth/Sex: Treating RN: 07-27-1970 (52 y.o. [name], [name] Primary Care [name]: [name] Other Clinician: Referring [name]: Treating [name]/Extender: [name] in Treatment: 31 Visit Information History Since Last Visit Added or deleted any medications: No Patient Arrived: [name] Any new allergies or adverse reactions: No Arrival Time: 09:05 Had a fall or experienced change in No Accompanied By: self activities of daily living that may affect Transfer Assistance: None risk of falls: Patient Identification Verified: Yes Signs or symptoms of abuse/neglect since last visito No Secondary Verification Process Completed: Yes Hospitalized since last visit: No Patient Requires Transmission-Based Precautions: No Implantable device outside of the clinic excluding No Patient Has Alerts: No cellular tissue based products placed in the center since last visit: Has Dressing in Place as Prescribed: Yes Has Compression in Place as Prescribed: Yes Pain Present Now: Yes Electronic Signature(s) Signed: 07/26/2022 4:02:58 PM By: [name] Entered By: [name] on 07/26/2022 09:06:22 -------------------------------------------------------------------------------- Encounter Discharge Information Details Patient Name: Date of Service: [name], [name] 07/26/2022 9:15 A M Medical Record Number: [id number] Patient Account Number: [id number] Date of Birth/Sex: Treating RN: 09-24-1970 (52 y.o. [name] Primary Care [name]: [name] Other Clinician: Referring [name]: Treating [name]/Extender: [name] in  Treatment: 69 Encounter Discharge Information Items Post Procedure Vitals Discharge Condition: Stable Temperature (F): 98.4 Ambulatory Status: Cane Pulse (bpm): 88 Discharge Destination: Home Respiratory Rate (breaths/min): 16 Transportation: Private Auto Blood Pressure (mmHg): 130/69 Accompanied By: self Schedule Follow-up Appointment: Yes Clinical Summary of Care: Patient Declined Electronic Signature(s) Signed: 07/26/2022 4:02:58 PM By: [name] Entered By: [name] on 07/26/2022 10:01:14 -------------------------------------------------------------------------------- Lower Extremity Assessment Details Patient Name: Date of Service: [name],  [name] 07/26/2022 9:15 A M Medical Record Number: [id number] Patient Account Number: [id number] Date of Birth/Sex: Treating RN: 11/28/1970 (52 y.o. [name] Primary Care [name]: [name] Other Clinician: Referring Janaiya Beauchesne: Treating [name]/Extender: [name] in Treatment: [address] Edema Assessment K[Left: [name] ([id number] [name] [id number].pdf Page 2 of 8] Assessed: [Left: No] [Right: No] Edema: [Left: Ye] [Right: s] Calf Left: Right: Point of Measurement: 33 cm From Medial Instep 31 cm Ankle Left: Right: Point of Measurement: 10 cm From Medial Instep 19.2 cm Vascular Assessment Pulses: Dorsalis Pedis Palpable: [Right:Yes] Electronic Signature(s) Signed: 07/26/2022 4:02:58 PM By: [name] Entered By: [name] on 07/26/2022 09:08:40 -------------------------------------------------------------------------------- Multi Wound Chart Details Patient Name: Date of Service: [name] [name], [name] 07/26/2022 9:15 A M Medical Record Number: [id number] Patient Account Number: [id number] Date of Birth/Sex: Treating RN: [birth year] (52 y.o. F) Primary Care [name]: [name] Other Clinician: Referring [name]: Treating  [name]/Extender: [name] in Treatment: 47 Vital Signs Height(in): 67 Pulse(bpm): 88 Weight(lbs): 134 Blood Pressure(mmHg): 130/69 Body Mass Index(BMI): 21 Temperature(F): 98.4 Respiratory Rate(breaths/min): 16 [17:Photos:] [N/A:N/A] Right, Lateral Lower Leg Right, Medial Ankle N/A Wound Location: Gradually Appeared Gradually Appeared N/A Wounding Event: [name] Cell Lesion [name] Cell Lesion N/A Primary Etiology: N/A Venous Leg Ulcer N/A Secondary Etiology: Anemia, [name] Cell Disease, Anemia, [name] Cell Disease, N/A Comorbid History: Neuropathy Neuropathy 10/05/2012 06/26/2021 N/A Date Acquired: 25 12 N/A [name] of Treatment: Open Open N/A Wound Status: No No N/A Wound Recurrence: Yes No N/A Clustered Wound: 6.5x5x0.2  7.2x3.4x0.1 N/A Measurements L x W x D (cm) 25.525 19.227 N/A A (cm) : rea 5.105 1.923 N/A Volume (cm) : 86.40% 33.70% N/A % Reduction in Area: 72.80% 33.60% N/A % Reduction in Volume: Full Thickness Without Exposed Full Thickness Without Exposed N/A Classification: Support Structures Support Structures Medium Medium N/A Exudate Amount: Serosanguineous Serosanguineous N/A Exudate Type: red, brown red, brown N/A Exudate Color: Distinct, outline attached Distinct, outline attached N/A Wound Margin: Medium (34-66%) Medium (34-66%) N/A Granulation Amount: Pink Pink N/A Granulation QualityEula [name] ([id number].pdf Page 3 of 8 Medium (34-66%) Medium (34-66%) N/A Necrotic Amount: Fat Layer (Subcutaneous Tissue): Yes Fat Layer (Subcutaneous Tissue): Yes N/A Exposed Structures: Fascia: No Fascia: No Tendon: No Tendon: No Muscle: No Muscle: No Joint: No Joint: No Bone: No Bone: No Small (1-33%) Small (1-33%) N/A Epithelialization: Debridement - Selective/Open Wound Debridement - Selective/Open Wound N/A Debridement: Pre-procedure Verification/Time Out 09:19  09:19 N/A Taken: Lidocaine 4% Topical Solution Lidocaine 4% Topical Solution N/A Pain Control: Psychologist, prison and probation services N/A Tissue Debrided: Non-Viable Tissue Non-Viable Tissue N/A Level: 0.25 0.09 N/A Debridement A (sq cm): rea Curette Curette N/A Instrument: Minimum Minimum N/A Bleeding: Pressure Pressure N/A Hemostasis A chieved: Procedure was tolerated well Procedure was tolerated well N/A Debridement Treatment Response: 6.5x5x0.2 7.2x3.4x0.1 N/A Post Debridement Measurements L x W x D (cm) 5.105 1.923 N/A Post Debridement Volume: (cm) Scarring: Yes Scarring: Yes N/A Periwound Skin Texture: No Abnormalities Noted No Abnormalities Noted N/A Periwound Skin Moisture: Ecchymosis: No No Abnormalities Noted N/A Periwound Skin Color: No Abnormality No Abnormality N/A Temperature: Yes Yes N/A Tenderness on Palpation: Debridement Debridement N/A Procedures Performed: Treatment Notes Electronic Signature(s) Signed: 07/26/2022 9:32:14 AM By: Fredirick Maudlin MD FACS Entered By: Fredirick Maudlin on 07/26/2022 09:32:14 -------------------------------------------------------------------------------- Multi-Disciplinary Care Plan Details Patient Name: Date of Service: [name], [name] 07/26/2022 9:15 A M Medical Record Number: [id number] Patient Account Number: [id number] Date of Birth/Sex: Treating RN: March 26, 1971 (52 y.o. [name] Primary Care Dane Bloch: [name] Other Clinician: Referring Shanesha Bednarz: Treating Anjalina Bergevin/Extender: [name] in Treatment: 62 Multidisciplinary Care Plan reviewed with physician Active Inactive Venous Leg Ulcer Nursing Diagnoses: Actual venous Insuffiency (use after diagnosis is confirmed) Knowledge deficit related to disease process and management Goals: Patient will maintain optimal edema control Date Initiated: 06/26/2021 Target Resolution Date: 08/15/2022 Goal Status:  Active Interventions: Assess peripheral edema status every visit. Compression as ordered Treatment Activities: Therapeutic compression applied : 06/26/2021 Notes: Wound/Skin Impairment Nursing Diagnoses: Impaired tissue integrity GoalsAVEY, SCOLARO ([id number].pdf Page 4 of 8 Patient/caregiver will verbalize understanding of skin care regimen Date Initiated: 04/17/2021 Target Resolution Date: 08/15/2022 Goal Status: Active Ulcer/skin breakdown will have a volume reduction of 30% by week 4 Date Initiated: 04/17/2021 Date Inactivated: 05/29/2021 Target Resolution Date: 05/15/2021 Goal Status: Met Ulcer/skin breakdown will have a volume reduction of 50% by week 8 Date Initiated: 05/29/2021 Date Inactivated: 06/26/2021 Target Resolution Date: 06/26/2021 Goal Status: Unmet Unmet Reason: venous reflux Interventions: Assess patient/caregiver ability to obtain necessary supplies Assess patient/caregiver ability to perform ulcer/skin care regimen upon admission and as needed Assess ulceration(s) every visit Provide education on ulcer and skin care Treatment Activities: Topical wound management initiated : 04/17/2021 Notes: 06/08/21: Left leg wounds greater than 30% volume reduction, right leg acute infection. Electronic Signature(s) Signed: 07/26/2022 4:02:58 PM By: [name] Entered By: [name] on 07/26/2022 10:00:20 -------------------------------------------------------------------------------- Pain Assessment Details Patient Name: Date of Service: [name], [name] 07/26/2022 9:15 A M Medical Record Number:  [id number] Patient Account Number: [id number] Date of Birth/Sex: Treating RN: 17-Jun-1970 (52 y.o. [name] Primary Care Leanore Biggers: [name] Other Clinician: Referring Annalei Friesz: Treating Burke Terry/Extender: [name] in Treatment: 69 Active Problems Location of Pain Severity and  Description of Pain Patient Has Paino Yes Site Locations Pain Location: Pain in Ulcers Duration of the Pain. Constant / Intermittento Constant Rate the pain. Current Pain Level: 2 Character of Pain Describe the Pain: Burning Pain Management and Medication Current Pain Management: Electronic Signature(s) Signed: 07/26/2022 4:02:58 PM By: [name] Entered By: [name] on 07/26/2022 09:06:53 [name] ([id number].pdf Page 5 of 8 -------------------------------------------------------------------------------- Patient/Caregiver Education Details Patient Name: Date of Service: [name] [name], Randall [name] 3/8/2024andnbsp9:15 [place] Record Number: [id number] Patient Account Number: [id number] Date of Birth/Gender: Treating RN: Oct 30, 1970 (52 y.o. [name] Primary Care Physician: [name] Other Clinician: Referring Physician: Treating Physician/Extender: [name] in Treatment: 22 Education Assessment Education Provided To: Patient Education Topics Provided Nutrition: Methods: Explain/Verbal Responses: Reinforcements needed, State content correctly Electronic Signature(s) Signed: 07/26/2022 4:02:58 PM By: [name] Entered By: [name] on 07/26/2022 10:00:37 -------------------------------------------------------------------------------- Wound Assessment Details Patient Name: Date of Service: [name] [name], [name] 07/26/2022 9:15 A M Medical Record Number: [id number] Patient Account Number: [id number] Date of Birth/Sex: Treating RN: 09-Jun-1970 (52 y.o. [name] Primary Care Lil Lepage: [name] Other Clinician: Referring Shantai Tiedeman: Treating Fatoumata Albaugh/Extender: [name] in Treatment: 66 Wound Status Wound Number: 17 Primary Etiology: [name] Cell Lesion Wound Location: Right, Lateral Lower Leg Wound Status:  Open Wounding Event: Gradually Appeared Comorbid History: Anemia, [name] Cell Disease, Neuropathy Date Acquired: 10/05/2012 [name] Of Treatment: 66 Clustered Wound: Yes Photos Wound Measurements Length: (cm) 6.5 Width: (cm) 5 Depth: (cm) 0.2 Area: (cm) 25.525 Volume: (cm) 5.105 % Reduction in Area: 86.4% % Reduction in Volume: 72.8% Epithelialization: Small (1-33%) Tunneling: No Undermining: No Wound Description Classification: Full Thickness Without Exposed Support Structures Wound Margin: Distinct, outline attached Exudate Amount: Medium Exudate Type: Serosanguineous [name], [name] ([id number]) Exudate Color: red, brown Foul Odor After Cleansing: No Slough/Fibrino Yes [id number] Page 6 of 8 Wound Bed Granulation Amount: Medium (34-66%) Exposed Structure Granulation Quality: Pink Fascia Exposed: No Necrotic Amount: Medium (34-66%) Fat Layer (Subcutaneous Tissue) Exposed: Yes Necrotic Quality: Adherent Slough Tendon Exposed: No Muscle Exposed: No Joint Exposed: No Bone Exposed: No Periwound Skin Texture Texture Color No Abnormalities Noted: No No Abnormalities Noted: No Scarring: Yes Ecchymosis: No Moisture Temperature / Pain No Abnormalities Noted: Yes Temperature: No Abnormality Tenderness on Palpation: Yes Treatment Notes Wound #17 (Lower Leg) Wound Laterality: Right, Lateral Cleanser Soap and Water Discharge Instruction: May shower and wash wound with dial antibacterial soap and water prior to dressing change. Wound Cleanser Discharge Instruction: Cleanse the wound with wound cleanser prior to applying a clean dressing using gauze sponges, not tissue or cotton balls. Peri-Wound Care Triamcinolone 15 (g) Discharge Instruction: Use triamcinolone 15 (g) as directed Sween Lotion (Moisturizing lotion) Discharge Instruction: Apply moisturizing lotion as directed Topical Skintegrity Hydrogel 4 (oz) Discharge Instruction: Apply  hydrogel as directed Primary Dressing ADAPTIC TOUCH 3x4.25 (in/in) Discharge Instruction: Apply to wound bed as instructed Secondary Dressing ABD Pad, 5x9 Discharge Instruction: Apply over primary dressing as directed. Secured With Elastic Bandage 4 inch (ACE bandage) Discharge Instruction: Secure with ACE bandage as directed. Compression Wrap Kerlix Roll 4.5x3.1 (in/yd) Discharge Instruction: Apply Kerlix and Coban compression as directed. Compression Stockings Add-Ons Electronic Signature(s) Signed: 07/26/2022 4:02:58 PM  By: [name] By: [name] on 07/26/2022 09:18:27 -------------------------------------------------------------------------------- Wound Assessment Details Patient Name: Date of Service: [name] [name], [place] [name] 07/26/2022 9:15 A M Medical Record Number: [id number] Patient Account Number: [id number] Date of Birth/Sex: Treating RN: 30-Apr-1971 (52 y.o. [name] Primary Care Dyron Kawano: [name] Other Clinician: Referring Keitra Carusone: Treating Demorris Choyce/Extender: [name] Jarvis [place]on, Legrand Rams ([id number]) [id number] Page 7 of 8 [name] in Treatment: 66 Wound Status Wound Number: 21 Primary Etiology: [name] Cell Lesion Wound Location: Right, Medial Ankle Secondary Etiology: Venous Leg Ulcer Wounding Event: Gradually Appeared Wound Status: Open Date Acquired: 06/26/2021 Comorbid History: Anemia, [name] Cell Disease, Neuropathy [name] Of Treatment: 56 Clustered Wound: No Photos Wound Measurements Length: (cm) 7.2 Width: (cm) 3.4 Depth: (cm) 0.1 Area: (cm) 19.227 Volume: (cm) 1.923 % Reduction in Area: 33.7% % Reduction in Volume: 33.6% Epithelialization: Small (1-33%) Tunneling: No Undermining: No Wound Description Classification: Full Thickness Without Exposed Support Structures Wound Margin: Distinct, outline attached Exudate Amount: Medium Exudate Type:  Serosanguineous Exudate Color: red, brown Foul Odor After Cleansing: No Slough/Fibrino Yes Wound Bed Granulation Amount: Medium (34-66%) Exposed Structure Granulation Quality: Pink Fascia Exposed: No Necrotic Amount: Medium (34-66%) Fat Layer (Subcutaneous Tissue) Exposed: Yes Necrotic Quality: Adherent Slough Tendon Exposed: No Muscle Exposed: No Joint Exposed: No Bone Exposed: No Periwound Skin Texture Texture Color No Abnormalities Noted: No No Abnormalities Noted: Yes Scarring: Yes Temperature / Pain Temperature: No Abnormality Moisture No Abnormalities Noted: Yes Tenderness on Palpation: Yes Treatment Notes Wound #21 (Ankle) Wound Laterality: Right, Medial Cleanser Soap and Water Discharge Instruction: May shower and wash wound with dial antibacterial soap and water prior to dressing change. Wound Cleanser Discharge Instruction: Cleanse the wound with wound cleanser prior to applying a clean dressing using gauze sponges, not tissue or cotton balls. Peri-Wound Care Triamcinolone 15 (g) Discharge Instruction: Use triamcinolone 15 (g) as directed Sween Lotion (Moisturizing lotion) Discharge Instruction: Apply moisturizing lotion as directed Topical [name] ([id number] Page 8 of 8 Primary Dressing ADAPTIC TOUCH 3x4.25 (in/in) Discharge Instruction: Apply to wound bed as instructed Secondary Dressing ABD Pad, 5x9 Discharge Instruction: Apply over primary dressing as directed. Secured With Elastic Bandage 4 inch (ACE bandage) Discharge Instruction: Secure with ACE bandage as directed. Compression Wrap Kerlix Roll 4.5x3.1 (in/yd) Discharge Instruction: Apply Kerlix and Coban compression as directed. Compression Stockings Add-Ons Electronic Signature(s) Signed: 07/26/2022 4:02:58 PM By: [name] Entered By: [name] on 07/26/2022  09:12:17 -------------------------------------------------------------------------------- Vitals Details Patient Name: Date of Service: [name], [name] 07/26/2022 9:15 A M Medical Record Number: [id number] Patient Account Number: [id number] Date of Birth/Sex: Treating RN: [name], 1971 (52 y.o. [name] Primary Care Aryiah Monterosso: [name] Other Clinician: Referring Kamya Watling: Treating Branna Cortina/Extender: [name] in Treatment: 24 Vital Signs Time Taken: 09:06 Temperature (F): 98.4 Height (in): 67 Pulse (bpm): 88 Weight (lbs): 134 Respiratory Rate (breaths/min): 16 Body Mass Index (BMI): 21 Blood Pressure (mmHg): 130/69 Reference Range: 80 - 120 mg / dl Electronic Signature(s) Signed: 07/26/2022 4:02:58 PM By: [name] Entered By: [name] on 07/26/2022 09:06:37

## 2022-07-28 NOTE — Progress Notes (Signed)
[name], [name] ([id number] Page 1 of 16 Visit Report for 07/26/2022 Chief Complaint Document Details Patient Name: Date of Service: [name] [name] 07/26/2022 9:15 A M Medical Record Number: [id number] Patient Account Number: [id number] Date of Birth/Sex: Treating RN: [birth year] (52 y.o. F) Primary Care Provider: [name] Other Clinician: Referring Provider: Treating Provider/Extender: Elyse Jarvis Weeks in Treatment: 66 Information Obtained from: Patient Chief Complaint the patient is here for evaluation of her bilateral lower extremity sickle cell ulcers 04/17/2021; patient comes in for substantial wounds on the right and left lower leg Electronic Signature(s) Signed: 07/26/2022 9:33:46 AM By: Fredirick Maudlin MD FACS Entered By: Fredirick Maudlin on 07/26/2022 09:33:46 -------------------------------------------------------------------------------- Debridement Details Patient Name: Date of Service: [name] [name], [name] 07/26/2022 9:15 A M Medical Record Number: [id number] Patient Account Number: [id number] Date of Birth/Sex: Treating RN: 1970/06/08 (52 y.o. [name] Primary Care Provider: [name] Other Clinician: Referring Provider: Treating Provider/Extender: Elyse Jarvis Weeks in Treatment: 66 Debridement Performed for Assessment: Wound #17 Right,Lateral Lower Leg Performed By: Physician Fredirick Maudlin, MD Debridement Type: Debridement Level of Consciousness (Pre-procedure): Awake and Alert Pre-procedure Verification/Time Out Yes - 09:19 Taken: Start Time: 09:19 Pain Control: Lidocaine 4% T opical Solution T Area Debrided (L x W): otal 0.5 (cm) x 0.5 (cm) = 0.25 (cm) Tissue and other material debrided: Non-Viable, Slough, Slough Level: Non-Viable Tissue Debridement Description: Selective/Open Wound Instrument: Curette Bleeding: Minimum Hemostasis Achieved:  Pressure Response to Treatment: Procedure was tolerated well Level of Consciousness (Post- Awake and Alert procedure): Post Debridement Measurements of Total Wound Length: (cm) 6.5 Width: (cm) 5 Depth: (cm) 0.2 Volume: (cm) 5.105 Character of Wound/Ulcer Post Debridement: Improved Post Procedure Diagnosis Same as Pre-procedure Notes scribed for Dr. Celine Ahr by Adline Peals, RN Electronic Signature(s) Signed: 07/26/2022 9:37:54 AM By: Fredirick Maudlin MD FACS Signed: 07/26/2022 4:02:58 PM By: Adline Peals Entered By: Adline Peals on 07/26/2022 09:19:29 [name] ([id number] Page 2 of 16 -------------------------------------------------------------------------------- Debridement Details Patient Name: Date of Service: [name] [name] 07/26/2022 9:15 A M Medical Record Number: [id number] Patient Account Number: [id number] Date of Birth/Sex: Treating RN: [birth year] (52 y.o. [name] Primary Care Provider: [name] Other Clinician: Referring Provider: Treating Provider/Extender: Elyse Jarvis Weeks in Treatment: 66 Debridement Performed for Assessment: Wound #21 Right,Medial Ankle Performed By: Physician Fredirick Maudlin, MD Debridement Type: Debridement Severity of Tissue Pre Debridement: Fat layer exposed Level of Consciousness (Pre-procedure): Awake and Alert Pre-procedure Verification/Time Out Yes - 09:19 Taken: Start Time: 09:19 Pain Control: Lidocaine 4% T opical Solution T Area Debrided (L x W): otal 0.3 (cm) x 0.3 (cm) = 0.09 (cm) Tissue and other material debrided: Non-Viable, Eschar Level: Non-Viable Tissue Debridement Description: Selective/Open Wound Instrument: Curette Bleeding: Minimum Hemostasis Achieved: Pressure Response to Treatment: Procedure was tolerated well Level of Consciousness (Post- Awake and Alert procedure): Post Debridement Measurements of Total  Wound Length: (cm) 7.2 Width: (cm) 3.4 Depth: (cm) 0.1 Volume: (cm) 1.923 Character of Wound/Ulcer Post Debridement: Improved Severity of Tissue Post Debridement: Fat layer exposed Post Procedure Diagnosis Same as Pre-procedure Notes scribed for Dr. Celine Ahr by Adline Peals, RN Electronic Signature(s) Signed: 07/26/2022 9:37:54 AM By: Fredirick Maudlin MD FACS Signed: 07/26/2022 4:02:58 PM By: Adline Peals Entered By: Adline Peals on 07/26/2022 09:20:10 -------------------------------------------------------------------------------- HPI Details Patient Name: Date of Service: [name] [name], [name] 07/26/2022 9:15 A M Medical Record Number: [id number] Patient Account Number: [id number] Date of Birth/Sex: Treating RN: [birth year] (51  y.o. F) Primary Care Provider: [name] Other Clinician: Referring Provider: Treating Provider/Extender: Elyse Jarvis Weeks in Treatment: 7 History of Present Illness Location: medial and lateral ankle region on the right and left medial malleolus Quality: Patient reports experiencing a shooting pain to affected area(s). Severity: Patient states wound(s) are getting worse. Duration: right lower extremity bimalleolar ulcers have been present for approximately 2 years; the rright meedial malleolus ulcer has been there proximally 6 months Timing: Pain in wound is constant (hurts all the time) Context: The wound would happen gradually ssociated Signs and Symptoms: Patient reports having increase discharge. A HPI Description: 52 year old patient with a history of sickle cell anemia who was last seen by me with ulceration of the right lower extremity above the ankle and was referred to Dr. Leland Johns for a surgical debridement as I was unable to do anything in the office due to excruciating pain. At that stage she was referred from the plastic surgery service to dermatology who treated her for a skin infection with doxycycline  and then Levaquin and a local antibiotic [name], [name] ([id number]) [id number].pdf Page 3 of 16 ointment. I understand the patient has since developed ulceration on the left ankle both medial and lateral and was now referred back to the wound center as dermatology has finished the management. I do not have any notes from the dermatology department Old notes: 52 year old patient with a history of sickle cell anemia, pain bilateral lower extremities, right lower extremity ulcer and has a history of receiving a skin graft( Theraskin) several months ago. She has been visiting the wound center [facility] and was seen by Dr. Dellia Nims and Dr. Leland Johns. after prolonged conservator therapy between July 2016 and January 2017. She had been seen by the plastic surgeon and taken to the OR for debridement and application of Theraskin. She had 3 applications of Theraskin and was then treated with collagen. Prior to that she had a history of similar problems in 2014 and was treated conservatively. Had a reflux study done for the right lower extremity in August 2016 without reflux or DVT . Past medical history significant for sickle cell disease, anemia, leg ulcers, cholelithiasis,and has never been a smoker. Once the patient was discharged on the wound center she says within 2 or 3 weeks the problems recurred and she has been treating it conservatively. since I saw her 3 weeks ago at [facility] she has been unable to get her dressing material but has completed a course of doxycycline. 6/7/ 2017 -- lower extremity venous duplex reflux evaluation was done No evidence of SVT or DVT in the RLL. No venous incompetence in the RLL. No further vascular workup is indicated at this time. She was seen by Dr. [name], on 10/04/2015. She agreed with the plan of taking her to the OR for debridement and application of theraskin and would also take biopsies to rule out pyoderma  gangrenosum. Follow-up note dated May 31 received and she was status post application of Theraskin to multiple ulcers around the right ankle. Pathology did not show evidence of malignancy or pyoderma gangrenosum. She would continue to see as in the wound clinic for further care and see Dr. Leland Johns as needed. The patient brought the biopsy report and it was consistent with stasis ulcer no evidence of malignancy and the comment was that there was some adjacent neovascularization, fibrosis and patchy perivascular chronic inflammation. 11/15/2015 -- today we applied her first application of Theraskin 11/30/15; TheraSkin #2 12/13/2015 -- she  is having a lot of pain locally and is here for possible application of a theraskin today. 01/16/2016 -- the patient has significant pain and has noticed despite in spite of all local care and oral pain medication. It is impossible to debride her in the office. 02/06/2016 -- I do not see any notes from Dr. Iran Planas( the patient has not made a call to the office know as she heard from them) and the only visit to recently was with her PCP Dr. Danella Penton -- I saw her on 01/16/2016 and prescribed 90 tablets of oxycodone 10 mg and did lab work and screening for HIV. the HIV was negative and hemoglobin was 6.3 with a WBC count of 14.9 and hematocrit of 17.8 with platelets of 561. 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 The bimalleolar ulcers to the right lower extremity have been present for approximately 2 years, the left medial malleolus ulcer has been present approximately 6 months. She has followed with Dr.Thimmappa in the past and has had a total of 3 applications of Theraskin (01/2015, 09/2015, 06/17/16). She has also followed with Dr. Con Memos here in the clinic and has received 2 applications of TheraSkin (11/10/15, 11/30/15). The patient does  experience chronic, and is not amenable to debridement. 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 She is not currently on any antibiotic therapy and has not been treated with any recently. 07/17/2016 -- was seen by Dr. Iran Planas of plastic surgery who saw her 2 weeks postop application of Theraskin #3. She had removed her dressing and asked her to apply silver alginate on alternate days and follow-up back with the wound center. Future debridements and application of skin substitute would have to be done in the hospital due to her high risk for anesthesia. READMISSION 04/17/2021 Patient is now a 52 year old woman that we have had in this clinic for a prolonged period of time and 2016-2017 and then again for 2 visits in February 2018. At that point she had wounds on the right lower leg predominantly medial. She had also been seen by plastic surgery Dr. Leland Johns who I believe took her to the OR for operative debridement and application of TheraSkin in 2017. After she left our clinic she was followed for a very prolonged period of time in the wound care center in [facility] who then referred her ultimately to [facility] where she was seen by Dr. Vernona Rieger. Again taken her to the OR for skin grafting which apparently did not take. She had multiple other attempts at dressings although I have not really looked over all of these notes in great detail. She has not been seen in a wound care center in about a year. She states over the last year in addition to her right lower leg she has developed wounds on the left lower leg quite extensive. She is using Xeroform to all of these wounds without really any improvement. She also has Medicaid which does not cover wound products. The patient has had vascular work-ups in the past including most recently on 03/28/2021 showing biphasic waveforms on the right triphasic at the PTA and biphasic at the dorsalis pedis on the left. She was  unable to tolerate any degree of compression to do ABIs. Unfortunately TBI's were also not done. She had venous reflux studies done in 2017. This did not show any evidence  of a DVT or SVT and no venous incompetence was noted in the right leg at the time this was the only side with the wound As noted I did not look all over her old records. She apparently had a course of HBO and [facility] although I am not sure what the indication would have been. In any case she developed seizures and terminated treatment earlier. She is generally much more disabled than when we last saw her in clinic. She can no longer walk pretty much wheelchair-bound because predominantly of pain in the left hip. 04/24/2021; the patient tolerated the wraps we put on. We used Santyl and Hydrofera Blue under compression. I brought her back for a nurse visit for a change in dressing. With Medicaid we will have a hard time getting anything paid for and hence the need for compression. She arrives in clinic with all the wounds looking somewhat better in terms of surface 12/20; circumferential wound on the right from the lateral to the medial. She has open areas on the left medial and left lateral x2 on all of this with the same surface. This does not look completely healthy although she does have some epithelialization. She is not complaining of a lot of pain which is unusual for her sickle ulcers. I have not looked over her extensive records from [facility]. She had recent arterial studies and has a history of venous reflux studies I will need to look these over although I do not believe she has significant arterial disease 2023 05/22/2021; patient's wound areas measure slightly smaller. Still a lot of drainage coming from the right we have been using Hydrofera Blue and Santyl with some improvement in the wound surfaces. She tells me she will be getting transfused later in the week for her underlying sickle cell anemia I have looked over her  recent arterial studies which were done in the fall. This was in November and showed biphasic and triphasic waveforms but she could not tolerate ABIs because of pressure and unfortunately TBI's were not done. She has not had recent venous reflux studies that I can see 1/10; not much change about the same surface area. This has a yellowish surface to it very gritty. We have been using Santyl and Hydrofera Blue for a prolonged period. Culture I did last week showed methicillin sensitive staph aureus "rare". Our intake nurse reports greenish drainage which may be the Hydrofera Blue itself 1/17; wounds are continue to measure smaller although I am not sure about the accuracy here. Especially the areas on the right are covered in what looks to be a nonviable surface although she does have some epithelialization. Similarly she has areas on the left medial and left lateral ankle area which appear to have a better surface and perhaps are slightly smaller. We have been using Santyl and Hydrofera Blue. She cannot tolerate mechanical debridements She went for her reflux studies which showed significant reflux at the greater saphenous vein at the saphenofemoral junction as well as the greater saphenous YACHI, CHARLIER ([id number] Page 4 of 16 vein in the proximal calf on the left she had reflux in the thigh and the common femoral vein and supra vein Fishel vein reflux in the greater saphenous vein. I will have vein and vascular look at this. My thoughts have been that these are likely sickle wounds. I looked through her old records from [facility] wound care center and then when she graduated to [facility] wound care center where she  saw Dr. Zigmund Daniel and Dr. Vernona Rieger. Although I can see she had reflux studies done I do not see that she actually saw a vein and vascular. I went over the fact that she had operative debridements and actual skin grafting that did not take. I  do not think these wounds have ever really progressed towards healing 1/31;Substantial wounds on the right ankle area. Hyper granulated very gritty adherent debris on the surface. 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 On the left she has no DVT or SVT deep vein reflux in the common femoral vein. Superficial vein reflux in the greater saphenous vein on although the vein diameter is not really all that large. I do not think there is anything that can be done with these although I am going to send her for consultation to vein and vascular. 2/7; Wound exam; substantial wound area on the right posterior ankle area and areas on the left medial ankle and left lateral ankle. I was able to debride the left medial ankle last week fairly aggressively and it is back this week to a completely nonviable surface She will see vascular surgery this Friday and I would like them to review the venous studies and also any comments on her arterial status. If they do not see an issue here I am going to refer her to plastic surgery for an operative debridement perhaps intraoperative ACell or Integra. Eventually she will require a deep tissue culture again 2/14; substantial wound area on the right posterior ankle, medial ankle. We have been using silver alginate The patient was seen by vein and vascular she had both venous reflux studies and arterial studies. 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 She had no evidence of superficial venous thrombosis. She did have superficial vein reflux  at the saphenofemoral junction and the greater saphenous vein. On the left no evidence of a DVT no evidence of superficial venous throat thrombosis she did have deep vein reflux in the common femoral vein and superficial vein reflux in the greater saphenous vein but these were not felt to be amenable to ablation. In terms of arterial studies she had triphasic and wife biphasic wave waveforms bilaterally not felt to have a significant arterial issue. I do not get the feeling that they felt that any part of her nonhealing wounds were related to either arterial or venous issues. They did note that she had venous reflux at the right at the [facility] and [facility]. And also on the left there were reflux in the deep system at the common femoral vein and greater saphenous vein in the proximal thigh. Nothing amenable to ablation. 2/20; she is making some decent progress on the right where there is nice skin between the 2 open areas on the right ankle. The surfaces here do not look viable yet there is some surrounding epithelialization. She still has a small area on the left medial ankle area. Hyper-granulated Jody's away always 2/28 patient has an appointment with plastic surgery on 3/8. We will see her back on  3/9. She may have to call us to get the area redressed. We've been using Santyl under silver alginate. We made a nice improvement on the left medial ankle. The larger wounds on the right also looks somewhat better in terms of epithelialization although I think they could benefit from an aggressive debridement if plastic surgery would be willing to do that. Perhaps placement of Integra or a cell 07/26/2021: She saw Dr. Claudia Desanctis yesterday. He raised the question as to whether or not this might be pyoderma and wanted to wait until that question was answered by dermatology before proceeding with any sort of operative debridement. We have continue to use Santyl under silver alginate with Kerlix and Coban wraps. Overall, her  wounds appear to be continuing to contract and epithelialize, with some granulation tissue present. There continues to be some slough on all wound surfaces. 08/09/2021: She has not been able to get an appointment with dermatology because apparently the offices in [place] do not accept Medicaid. She is looking into whether or not she can be seen at the main [facility] dermatology clinic. This is necessary because plastic surgery is concerned that her wounds might represent pyoderma and they did not want to do any procedure until that was clarified. We have been using Santyl under silver alginate with Kerlix and Coban wraps. Today, there was a greater amount of drainage on her dressings with a slight green discoloration and significant odor. Despite this, her wounds continue to contract and epithelialize. There is pale granulation tissue present and actually, on the left medial ankle, the granulation tissue is a bit hypertrophic. 08/16/2021: Last week, I took a culture and this grew back rare methicillin-resistant Staph aureus and rare corynebacterium. The MRSA was sensitive to gentamicin which we began applying topically on an empiric basis. This week, her wounds are a bit smaller and the drainage and odor are less. Her primary care provider is working on assisting the patient with a dermatology evaluation. She has been in silver alginate over the gentamicin that was started last week along with Kerlix and Coban wraps. 08/23/2021: Because she has Medicaid, we have been unable to get her into see any dermatologist in the [place] to rule out pyoderma gangrenosum, which was a requirement from plastic surgery prior to any sort of debridement and grafting. Despite this, however, all of her wounds continue to get smaller. The wound on her left medial ankle is nearly closed. There is no odor from the wounds, although she still accumulates a modest amount of drainage on her  dressings. 08/30/2021: The lateral right ankle wound and the medial left ankle wound are a bit smaller today. The medial right ankle wound is about the same size. They are less tender. We have still been unable to get her into dermatology. 09/06/2021: All of the wounds are about the same size today. She continues to endorse minimal pain. I communicated with Dr. Claudia Desanctis in plastic surgery regarding our issues getting a dermatology appointment; he was out of town but indicated that he would look into perhaps performing the biopsy in his office and will have his office contact her. 09/14/2021: The patient has an appointment in dermatology, but it is not until October. Her wounds are roughly the same; she continues to have very thick purulent-looking drainage on her dressings. 09/20/2021: The left medial wound is nearly closed and just has a bit of accumulated eschar on the surface. The right medial and lateral ankle wounds are perhaps  a little bit smaller. They continue to have a very pale surface with accumulation of thin slough. 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 I did not think [employer] was indicated based on this. She is getting topical mupirocin with Prisma silver collagen. 10/04/2021: The patient was not seen in clinic last week due to childcare coverage issues. In the interim, the left medial leg wound has closed. The right sided leg wounds are smaller. There is more granulation tissue coming through, particularly on the lateral wound. The surface remains somewhat gritty. We have been applying topical mupirocin and Prisma silver collagen. 10/11/2021: The left medial leg wound remains closed. She does complain of some anesthetic sensation to the area. Both of the right-sided leg wounds are smaller but still have accumulated slough. 10/18/2021: Both right-sided leg wounds are minimally smaller this week. She still continues to accumulate slough and has thick drainage on her  dressings. 10/23/2021: Both wounds continue to contract. There is still slough buildup. She has been approved for a keratin-based skin substitute trial product but it will not be available until next week. 10/30/2021: The wounds are about the same to perhaps slightly smaller. There is still continued slough buildup. Unfortunately, the rep for the keratin based product did not show up today and did not answer his phone when called. 11/08/2021: The wounds are little bit smaller today. She continues to have thick drainage but the surfaces are relatively clean with just a little bit of slough accumulation. She reported to me today that she is unable to completely flex her left ankle and on examination it seems this is potentially related to scar tissue from her wounds. We do have the Pro[employer] trial product available for her today. MALESHA, BENVENUTO ([id number] Page 5 of 16 11/15/2021: Both wounds are smaller today. There is some slough accumulation on the surfaces, but the medial wound, in particular looks like it is filling in and is less deep. She did hear from physical therapy and she is going to start working with them on July 11. She is here for her second application of the trial skin substitute, ProgenaMatrix. 11/22/2021: Both wounds continue to contract, the medial more dramatically than the lateral. Both wounds have a layer of slough on the surface, but underneath this, the gritty fibrous tissue has a little bit more of a pink cast to it rather than being as pale as it has been. 11/29/2021: The wounds are roughly the same size this week, perhaps a millimeter or 2 smaller. The medial wound has filled in and is nearly flush with the surrounding skin surface. She continues to have a lot of slough accumulation on both surfaces. 12/06/2021: No significant change to her wounds, but she has a new opening on her dorsal foot, just distal to the right lateral ankle wound.  The area on her left medial ankle that reopened looks a little bit larger today. She has quite a bit of pain associated with the new wound. 12/12/2021: Her wounds look about the same but the new opening on her right lateral dorsal foot is a little bit bigger. She continues to have a fair amount of pain with this wound. 12/26/2021: The left medial ankle wound is tiny and superficial. She has 2 areas of crusting on her left lateral ankle, however, that appear to be threatening to open again. Her right medial ankle wound is a little bit smaller today but still continues to accumulate thick rubbery slough. The new  dorsal foot wound is exquisitely painful but there is no odor or purulent drainage. No erythema or induration. The right lateral ankle wound looks about the same today, again with thick rubbery slough. 01/03/2022: The left medial ankle wound has closed again. Both right ankle wounds appear to be about the same size with thick rubbery slough. The dorsal foot wound on the right continues to be quite painful and she stated that she did not want any debridement of that site today. 01/10/2022: No real change to any of her wounds. She continues to accumulate thick slough. The dorsal foot wound has merged with the lateral malleolar wound. She is experiencing significant pain in the dorsal foot portion of the ulcer. 01/16/2022: Absolutely no change or progress in her wounds. 01/24/2022: Her wounds are unchanged. She continues to build up slough and the wounds on her dorsal right foot are still exquisitely tender. 01/31/2022: The wounds actually measure a little bit narrower today. They still have thick slough on the surface but the underlying tissue seems a little less fibrotic. We changed to Iodosorb last week. 02/07/2022: Wounds continue to slowly epithelialize around the parameters. She has less pain today. She still accumulates a fairly substantial layer of slough. 02/15/2022: No change to the wounds  overall. The measured a little bit larger today per the intake nurse. She continues to have substantial slough accumulation and her pain is a little bit worse. 02/21/2022. No change at all to her wounds. I do not see that plastic surgery has received her referral yet. 02/28/2022: The wounds remain unchanged. They are dry and fibrotic with accumulation of slough and eschar. She did receive an appointment to see plastic surgery on October 23, but the office called her back and indicated they needed to reschedule and that the next available appointment was not until December. The patient became angry and decided she did not want to see this plastics group. 10/19; the patient sees Dr. Lovett Calender again next week. She is using Medihoney as the primary dressing changing this herself. 03/21/2022: She saw Dr. Jaclynn Guarneri at the wound care center at [facility]. Dr. Jaclynn Guarneri was also unable to debride her, secondary to pain and is planning to take her to the operating room for operative debridement and potential skin substitute placement. Her wounds are unchanged with thick yellow slough and a fibrotic base. She says they are too painful to be debrided. In addition, yesterday her hemoglobin was 4 and she received 2 units packed red blood cell transfusion. 04/04/2022: Her operation at [facility] is scheduled for December 15. She continues to use Medihoney on her wounds. They are a little bit less painful today and she is willing to entertain the possibility of debridement. The wounds measured a little bit larger in all dimensions today but the layer of slough is not as thick as usual. 04/18/2022: Her wounds actually measured a little bit smaller today and the extension onto the dorsal foot from the lateral wound has healed. They are less tender and there is significantly less slough present as compared to prior visits. 05/09/2022: She had to reschedule her surgery due to lack of childcare. It is  now planned for January 5. The wounds are basically unchanged, but she had a lot more drainage, which was thick and somewhat purulent in appearance. No significant odor. 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 They are quite painful today and she does not want to have debridement performed. 05/30/2022: The patient underwent surgical  debridement and placement of TheraSkin to her wounds on January 5. She has been doing well and does not endorse any pain. The TheraSkin is intact and looks beautiful. It is sutured in place. There is no exudate or significant drainage. 06/04/2022: Her TheraSkin remains intact and I am starting to see granulation tissue emerging underneath the surface. No concern for infection. 06/12/2022: The TheraSkin is intact and there is more granulation tissue emerging. It is starting to look a little bit dry, however. No malodor or purulent drainage. 06/19/2022: The TheraSkin continues to look good. The edges are a bit dry, but the central portion of each of her wounds is showing a nice pink color. 06/27/2022: The TheraSkin on the lateral wound remains almost completely intact. There is nice pink tissue peaking through the mesh of the TheraSkin. On the medial leg, it is starting to breakdown a little bit, but most of the TheraSkin remains intact here, as well. Both wounds measured smaller today. 07/03/2022: The wounds are fairly dry around the perimeter today. Some of the suture is hanging loose. 07/12/2022: Both wounds are measuring smaller today. The medial ulcer is getting a bit too dry. The lateral ulcer continues to have nice buds of granulation tissue emerging. 07/26/2022: The moisture balance on both wounds is much better today. For the first time since I began seeing her, I see actual beefy red granulation tissue on the lateral ankle wound. There are more buds of deep pink granulation tissue emerging on the medial ankle. There is some dry eschar around the edges of  the medial wound and a tiny amount of slough overlying the good granulation tissue on the lateral wound. Electronic Signature(s) Signed: 07/26/2022 9:34:46 AM By: Fredirick Maudlin MD FACS Entered By: Fredirick Maudlin on 07/26/2022 09:34:46 [name] ([id number] Page 6 of 16 -------------------------------------------------------------------------------- Physical Exam Details Patient Name: Date of Service: [name] [name] 07/26/2022 9:15 A M Medical Record Number: [id number] Patient Account Number: [id number] Date of Birth/Sex: Treating RN: May 17, 1971 (52 y.o. F) Primary Care Provider: [name] Other Clinician: Referring Provider: Treating Provider/Extender: Elyse Jarvis Weeks in Treatment: 23 Constitutional . . . . no acute distress. Respiratory Normal work of breathing on room air. Notes 07/26/2022: The moisture balance on both wounds is much better today. For the first time since I began seeing her, I see actual beefy red granulation tissue on the lateral ankle wound. There are more buds of deep pink granulation tissue emerging on the medial ankle. There is some dry eschar around the edges of the medial wound and a tiny amount of slough overlying the good granulation tissue on the lateral wound. Electronic Signature(s) Signed: 07/26/2022 9:35:16 AM By: Fredirick Maudlin MD FACS Entered By: Fredirick Maudlin on 07/26/2022 09:35:16 -------------------------------------------------------------------------------- Physician Orders Details Patient Name: Date of Service: [name] [name], [name] 07/26/2022 9:15 A M Medical Record Number: [id number] Patient Account Number: [id number] Date of Birth/Sex: Treating RN: 1971-05-15 (52 y.o. 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 - Dr. Celine Ahr - room 2 Return A Anesthetic (In clinic) Topical Lidocaine 4% applied to wound bed Cellular or Tissue Based Products Other Cellular or Tissue Based Products Orders/Instructions: - Theraskin  applied by plastic surgeon, leave on until further notice Bathing/ Shower/ Hygiene Do not shower or bathe in tub. Edema Control - Lymphedema / SCD / Other Avoid standing for long periods of time. Exercise regularly Additional Orders / Instructions Follow Nutritious Diet Wound Treatment Wound #17 - Lower Leg Wound Laterality: Right, Lateral Cleanser: Soap and Water 2 x Per Day/30 Days Discharge Instructions: May shower and wash wound with dial antibacterial soap and water prior to dressing change. Cleanser: Wound Cleanser 2 x Per Day/30 Days Discharge Instructions: Cleanse the wound with wound cleanser prior to applying a clean dressing using gauze sponges, not tissue or cotton balls. Peri-Wound Care: Triamcinolone 15 (g) 2 x Per Day/30 Days CRISSA, LINDT ([id number]) [phone].pdf Page 7 of 16 Discharge Instructions: Use triamcinolone 15 (g) as directed Peri-Wound Care: Sween Lotion (Moisturizing lotion) 2 x Per Day/30 Days Discharge Instructions: Apply moisturizing lotion as directed Topical: Skintegrity Hydrogel 4 (oz) 2 x Per Day/30 Days Discharge Instructions: Apply hydrogel as directed Prim Dressing: ADAPTIC TOUCH 3x4.25 (in/in) 2 x Per Day/30 Days ary Discharge Instructions: Apply to wound bed as instructed Secondary Dressing: ABD Pad, 5x9 2 x Per Day/30 Days Discharge Instructions: Apply over primary dressing as directed. Secured With: Elastic Bandage 4 inch (ACE bandage) 2 x Per Day/30 Days Discharge  Instructions: Secure with ACE bandage as directed. Compression Wrap: Kerlix Roll 4.5x3.1 (in/yd) 2 x Per Day/30 Days Discharge Instructions: Apply Kerlix and Coban compression as directed. Wound #21 - Ankle Wound Laterality: Right, Medial Cleanser: Soap and Water 2 x Per Day/30 Days Discharge Instructions: May shower and wash wound with dial antibacterial soap and water prior to dressing change. Cleanser: Wound Cleanser 2 x Per Day/30 Days Discharge Instructions: Cleanse the wound with wound cleanser prior to applying a clean dressing using gauze sponges, not tissue or cotton balls. Peri-Wound Care: Triamcinolone 15 (g) 2 x Per Day/30 Days Discharge Instructions: Use triamcinolone 15 (g) as directed Peri-Wound Care: Sween Lotion (Moisturizing lotion) 2 x Per Day/30 Days Discharge Instructions: Apply moisturizing lotion as directed Prim Dressing: ADAPTIC TOUCH 3x4.25 (in/in) 2 x Per Day/30 Days ary Discharge Instructions: Apply to wound bed as instructed Secondary Dressing: ABD Pad, 5x9 2 x Per Day/30 Days Discharge Instructions: Apply over primary dressing as directed. Secured With: Elastic Bandage 4 inch (ACE bandage) 2 x Per Day/30 Days Discharge Instructions: Secure with ACE bandage as directed. Compression Wrap: Kerlix Roll 4.5x3.1 (in/yd) 2 x Per Day/30 Days Discharge Instructions: Apply Kerlix and Coban compression as directed. Patient Medications llergies: No Known Allergies A Notifications Medication Indication Start End 07/26/2022 lidocaine DOSE topical 4 % cream - cream topical Electronic Signature(s) Signed: 07/26/2022 9:37:54 AM By: Fredirick Maudlin MD FACS Entered By: Fredirick Maudlin on 07/26/2022 09:35:43 -------------------------------------------------------------------------------- Problem List Details Patient Name: Date of Service: [name] [name], [name] 07/26/2022 9:15 A M Medical Record Number: [id number] Patient Account Number: [id number] Date of Birth/Sex: Treating  RN: [birth year] (52 y.o. F) Primary Care Provider: [name] Other Clinician: Referring Provider: Treating Provider/Extender: Elyse Jarvis Weeks in Treatment: [address] Problems ICD-10 [name] ([id number]) [id number] Page 8 of 16 Encounter Code Description Active Date MDM Diagnosis L97.818 Non-pressure chronic ulcer of other part of right lower leg with other specified 04/17/2021 No Yes severity 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 04/17/2021 No Yes Inactive Problems ICD-10 Code Description Active Date Inactive Date L97.828 Non-pressure chronic ulcer of other part of left lower leg with other specified severity 04/17/2021 04/17/2021 Resolved Problems Electronic Signature(s) Signed: 07/26/2022  9:32:03 AM By: Fredirick Maudlin MD FACS Entered By: Fredirick Maudlin on 07/26/2022 09:32:03 -------------------------------------------------------------------------------- Progress Note Details Patient Name: Date of Service: [name] [name], [name] 07/26/2022 9:15 A M Medical Record Number: [id number] Patient Account Number: [id number] Date of Birth/Sex: Treating RN: 07/18/1970 (52 y.o. F) Primary Care Provider: [name] Other Clinician: Referring Provider: Treating Provider/Extender: Elyse Jarvis Weeks in Treatment: 8 Subjective Chief Complaint Information obtained from Patient the patient is here for evaluation of her bilateral lower extremity sickle cell ulcers 04/17/2021; patient comes in for substantial wounds on the right and left lower leg History of Present Illness (HPI) The following HPI elements were documented for the patient's wound: Location: medial and lateral ankle region on the right and left medial malleolus Quality: Patient reports experiencing a shooting pain to affected area(s). Severity: Patient states wound(s) are getting worse. Duration: right lower extremity bimalleolar ulcers  have been present for approximately 2 years; the rright meedial malleolus ulcer has been there proximally 6 months Timing: Pain in wound is constant (hurts all the time) Context: The wound would happen gradually Associated Signs and Symptoms: Patient reports having increase discharge. 52 year old patient with a history of sickle cell anemia who was last seen by me with ulceration of the right lower extremity above the ankle and was referred to Dr. Leland Johns for a surgical debridement as I was unable to do anything in the office due to excruciating pain. At that stage she was referred from the plastic surgery service to dermatology who treated her for a skin infection with doxycycline and then Levaquin and a local antibiotic ointment. I understand the patient has since developed ulceration on the left ankle both medial and lateral and was now referred back to the wound center as dermatology has finished the management. I do not have any notes from the dermatology department Old notes: 52 year old patient with a history of sickle cell anemia, pain bilateral lower extremities, right lower extremity ulcer and has a history of receiving a skin graft( Theraskin) several months ago. She has been visiting the wound center [facility] and was seen by Dr. Dellia Nims and Dr. Leland Johns. after prolonged conservator therapy between July 2016 and January 2017. She had been seen by the plastic surgeon and taken to the OR for debridement and application of Theraskin. She had 3 applications of Theraskin and was then treated with collagen. Prior to that she had a history of similar problems in 2014 and was treated conservatively. Had a reflux study done for the right lower extremity in August 2016 without reflux or DVT . Past medical history significant for sickle cell disease, anemia, leg ulcers, cholelithiasis,and has never been a smoker. Once the patient was discharged on the wound center she says within 2 or 3 weeks  the problems recurred and she has been treating it conservatively. since I saw her 3 weeks ago at [facility] she has been unable to get her dressing material but has completed a course of doxycycline. 6/7/ 2017 -- lower extremity venous duplex reflux evaluation was done oo No evidence of SVT or DVT in the RLL. No venous incompetence in the RLL. No further vascular workup is indicated at this time. She was seen by Dr. [name], on 10/04/2015. She agreed with the plan of taking her to the OR for debridement and application of theraskin and would also take biopsies to rule out pyoderma gangrenosum. Follow-up note dated May 31 received and she was status post application of Theraskin to multiple  ulcers around the right ankle. Pathology did not show evidence of malignancy or pyoderma gangrenosum. She would continue to see as in the wound clinic for further care and see Dr. Leland Johns as needed. The patient brought the biopsy report and it was consistent with stasis ulcer no evidence of malignancy and the comment was that there was some adjacent Pelican, Legrand Rams ([id number]) [id number] Page 9 of 16 neovascularization, fibrosis and patchy perivascular chronic inflammation. 11/15/2015 -- today we applied her first application of Theraskin 11/30/15; TheraSkin #2 12/13/2015 -- she is having a lot of pain locally and is here for possible application of a theraskin today. 01/16/2016 -- the patient has significant pain and has noticed despite in spite of all local care and oral pain medication. It is impossible to debride her in the office. 02/06/2016 -- I do not see any notes from Dr. Iran Planas( the patient has not made a call to the office know as she heard from them) and the only visit to recently was with her PCP Dr. Danella Penton -- I saw her on 01/16/2016 and prescribed 90 tablets of oxycodone 10 mg and did lab work and screening for HIV. the HIV was negative and  hemoglobin was 6.3 with a WBC count of 14.9 and hematocrit of 17.8 with platelets of 561. 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 The bimalleolar ulcers to the right lower extremity have been present for approximately 2 years, the left medial malleolus ulcer has been present approximately 6 months. She has followed with Dr.Thimmappa in the past and has had a total of 3 applications of Theraskin (01/2015, 09/2015, 06/17/16). She has also followed with Dr. Con Memos here in the clinic and has received 2 applications of TheraSkin (11/10/15, 11/30/15). The patient does experience chronic, and is not amenable to debridement. 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 She is not currently on any antibiotic therapy and has not been treated with any recently. 07/17/2016 -- was seen by Dr. Iran Planas of plastic surgery who saw her 2 weeks postop application of Theraskin #3. She had removed her dressing and asked her to apply silver alginate on alternate days and follow-up back with the wound center. Future debridements and application of skin substitute would have to be done in the hospital due to her high risk for anesthesia. READMISSION 04/17/2021 Patient is now a 52 year old woman that we have had in this clinic for a prolonged period of time and 2016-2017 and then again for 2 visits in February 2018. At that point she had wounds on the right lower leg predominantly medial. She had also been seen by plastic surgery Dr. Leland Johns who I believe took her to the OR for operative debridement and application of TheraSkin in 2017. After she left our clinic she was followed for a very prolonged period of time in the wound care center in [facility] who then referred her ultimately to [facility] where she was seen by Dr. Vernona Rieger. Again taken her to the OR for skin grafting  which apparently did not take. She had multiple other attempts at dressings although I have not really looked over all of these notes in great detail. She has not been seen in a wound care center in about a year. She states over the last year in addition to her right lower leg she has developed wounds on the left lower  leg quite extensive. She is using Xeroform to all of these wounds without really any improvement. She also has Medicaid which does not cover wound products. The patient has had vascular work-ups in the past including most recently on 03/28/2021 showing biphasic waveforms on the right triphasic at the PTA and biphasic at the dorsalis pedis on the left. She was unable to tolerate any degree of compression to do ABIs. Unfortunately TBI's were also not done. She had venous reflux studies done in 2017. This did not show any evidence of a DVT or SVT and no venous incompetence was noted in the right leg at the time this was the only side with the wound As noted I did not look all over her old records. She apparently had a course of HBO and [facility] although I am not sure what the indication would have been. In any case she developed seizures and terminated treatment earlier. She is generally much more disabled than when we last saw her in clinic. She can no longer walk pretty much wheelchair-bound because predominantly of pain in the left hip. 04/24/2021; the patient tolerated the wraps we put on. We used Santyl and Hydrofera Blue under compression. I brought her back for a nurse visit for a change in dressing. With Medicaid we will have a hard time getting anything paid for and hence the need for compression. She arrives in clinic with all the wounds looking somewhat better in terms of surface 12/20; circumferential wound on the right from the lateral to the medial. She has open areas on the left medial and left lateral x2 on all of this with the same surface. This does not look completely  healthy although she does have some epithelialization. She is not complaining of a lot of pain which is unusual for her sickle ulcers. I have not looked over her extensive records from [facility]. She had recent arterial studies and has a history of venous reflux studies I will need to look these over although I do not believe she has significant arterial disease 2023 05/22/2021; patient's wound areas measure slightly smaller. Still a lot of drainage coming from the right we have been using Hydrofera Blue and Santyl with some improvement in the wound surfaces. She tells me she will be getting transfused later in the week for her underlying sickle cell anemia I have looked over her recent arterial studies which were done in the fall. This was in November and showed biphasic and triphasic waveforms but she could not tolerate ABIs because of pressure and unfortunately TBI's were not done. She has not had recent venous reflux studies that I can see 1/10; not much change about the same surface area. This has a yellowish surface to it very gritty. We have been using Santyl and Hydrofera Blue for a prolonged period. Culture I did last week showed methicillin sensitive staph aureus "rare". Our intake nurse reports greenish drainage which may be the Hydrofera Blue itself 1/17; wounds are continue to measure smaller although I am not sure about the accuracy here. Especially the areas on the right are covered in what looks to be a nonviable surface although she does have some epithelialization. Similarly she has areas on the left medial and left lateral ankle area which appear to have a better surface and perhaps are slightly smaller. We have been using Santyl and Hydrofera Blue. She cannot tolerate mechanical debridements She went for her reflux studies which showed significant reflux at the greater saphenous vein at the  saphenofemoral junction as well as the greater saphenous vein in the proximal calf on the left  she had reflux in the thigh and the common femoral vein and supra vein Fishel vein reflux in the greater saphenous vein. I will have vein and vascular look at this. My thoughts have been that these are likely sickle wounds. I looked through her old records from [facility] wound care center and then when she graduated to [facility] wound care center where she saw Dr. Zigmund Daniel and Dr. Vernona Rieger. Although I can see she had reflux studies done I do not see that she actually saw a vein and vascular. I went over the fact that she had operative debridements and actual skin grafting that did not take. I do not think these wounds have ever really progressed towards healing 1/31;Substantial wounds on the right ankle area. Hyper granulated very gritty adherent debris on the surface. 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 On the left she has no DVT or SVT deep vein reflux in the common femoral vein. Superficial vein reflux in the greater saphenous vein on although the vein diameter is not really all that large. I do not think there is anything that can be done with these although I am going to send her for consultation to vein and vascular. 2/7; Wound exam; substantial wound area on the right posterior ankle area and areas on the left medial ankle and left lateral ankle. I was able to debride the left medial ankle last week fairly aggressively and it is back this week to a completely nonviable surface She will see vascular surgery this Friday and I would like them to review the venous studies and also any comments on her arterial status. If they do not  see an issue here I am going to refer her to plastic surgery for an operative debridement perhaps intraoperative ACell or Integra. Eventually she will require a deep tissue culture again 2/14; substantial wound area on the right posterior ankle, medial ankle. We have been using silver alginate [place], Legrand Rams ([id number]) [id number] Page 10 of 16 The patient was seen by vein and vascular she had both venous reflux studies and arterial studies. 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 She had no evidence of superficial venous thrombosis. She did have superficial vein reflux at the saphenofemoral junction and the greater saphenous vein. On the left no evidence of a DVT no evidence of superficial venous throat thrombosis she did have deep vein reflux in the common femoral vein and superficial vein reflux in the greater saphenous vein but these were not felt to be amenable to ablation. In terms of arterial studies she had triphasic and wife biphasic wave waveforms bilaterally not felt to have a significant arterial issue. I do not get the feeling that they felt that any part of her nonhealing wounds were related to either arterial or venous issues. They did note that she had venous reflux at the right at the [facility] and [facility]. And also on the left there were  reflux in the deep system at the common femoral vein and greater saphenous vein in the proximal thigh. Nothing amenable to ablation. 2/20; she is making some decent progress on the right where there is nice skin between the 2 open areas on the right ankle. The surfaces here do not look viable yet there is some surrounding epithelialization. She still has a small area on the left medial ankle area. Hyper-granulated Jody's away always 2/28 patient has an appointment with plastic surgery on 3/8. We will see her back on 3/9. She may have to call us to get  the area redressed. We've been using Santyl under silver alginate. We made a nice improvement on the left medial ankle. The larger wounds on the right also looks somewhat better in terms of epithelialization although I think they could benefit from an aggressive debridement if plastic surgery would be willing to do that. Perhaps placement of Integra or a cell 07/26/2021: She saw Dr. Claudia Desanctis yesterday. He raised the question as to whether or not this might be pyoderma and wanted to wait until that question was answered by dermatology before proceeding with any sort of operative debridement. We have continue to use Santyl under silver alginate with Kerlix and Coban wraps. Overall, her wounds appear to be continuing to contract and epithelialize, with some granulation tissue present. There continues to be some slough on all wound surfaces. 08/09/2021: She has not been able to get an appointment with dermatology because apparently the offices in [place] do not accept Medicaid. She is looking into whether or not she can be seen at the main [facility] dermatology clinic. This is necessary because plastic surgery is concerned that her wounds might represent pyoderma and they did not want to do any procedure until that was clarified. We have been using Santyl under silver alginate with Kerlix and Coban wraps. Today, there was a greater amount of drainage on her dressings with a slight green discoloration and significant odor. Despite this, her wounds continue to contract and epithelialize. There is pale granulation tissue present and actually, on the left medial ankle, the granulation tissue is a bit hypertrophic. 08/16/2021: Last week, I took a culture and this grew back rare methicillin-resistant Staph aureus and rare corynebacterium. The MRSA was sensitive to gentamicin which we began applying topically on an empiric basis. This week, her wounds are a bit smaller and the drainage and  odor are less. Her primary care provider is working on assisting the patient with a dermatology evaluation. She has been in silver alginate over the gentamicin that was started last week along with Kerlix and Coban wraps. 08/23/2021: Because she has Medicaid, we have been unable to get her into see any dermatologist in the [place] to rule out pyoderma gangrenosum, which was a requirement from plastic surgery prior to any sort of debridement and grafting. Despite this, however, all of her wounds continue to get smaller. The wound on her left medial ankle is nearly closed. There is no odor from the wounds, although she still accumulates a modest amount of drainage on her dressings. 08/30/2021: The lateral right ankle wound and the medial left ankle wound are a bit smaller today. The medial right ankle wound is about the same size. They are less tender. We have still been unable to get her into dermatology. 09/06/2021: All of the wounds are about the same size today. She continues to endorse minimal pain. I communicated with Dr. Claudia Desanctis in plastic surgery regarding  our issues getting a dermatology appointment; he was out of town but indicated that he would look into perhaps performing the biopsy in his office and will have his office contact her. 09/14/2021: The patient has an appointment in dermatology, but it is not until October. Her wounds are roughly the same; she continues to have very thick purulent-looking drainage on her dressings. 09/20/2021: The left medial wound is nearly closed and just has a bit of accumulated eschar on the surface. The right medial and lateral ankle wounds are perhaps a little bit smaller. They continue to have a very pale surface with accumulation of thin slough. 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 I did not think [employer] was indicated based on this. She is getting topical mupirocin with Prisma silver collagen. 10/04/2021: The patient was not seen in  clinic last week due to childcare coverage issues. In the interim, the left medial leg wound has closed. The right sided leg wounds are smaller. There is more granulation tissue coming through, particularly on the lateral wound. The surface remains somewhat gritty. We have been applying topical mupirocin and Prisma silver collagen. 10/11/2021: The left medial leg wound remains closed. She does complain of some anesthetic sensation to the area. Both of the right-sided leg wounds are smaller but still have accumulated slough. 10/18/2021: Both right-sided leg wounds are minimally smaller this week. She still continues to accumulate slough and has thick drainage on her dressings. 10/23/2021: Both wounds continue to contract. There is still slough buildup. She has been approved for a keratin-based skin substitute trial product but it will not be available until next week. 10/30/2021: The wounds are about the same to perhaps slightly smaller. There is still continued slough buildup. Unfortunately, the rep for the keratin based product did not show up today and did not answer his phone when called. 11/08/2021: The wounds are little bit smaller today. She continues to have thick drainage but the surfaces are relatively clean with just a little bit of slough accumulation. She reported to me today that she is unable to completely flex her left ankle and on examination it seems this is potentially related to scar tissue from her wounds. We do have the ProgenaMatrix trial product available for her today. 11/15/2021: Both wounds are smaller today. There is some slough accumulation on the surfaces, but the medial wound, in particular looks like it is filling in and is less deep. She did hear from physical therapy and she is going to start working with them on July 11. She is here for her second application of the trial skin substitute, ProgenaMatrix. 11/22/2021: Both wounds continue to contract, the medial more dramatically  than the lateral. Both wounds have a layer of slough on the surface, but underneath this, the gritty fibrous tissue has a little bit more of a pink cast to it rather than being as pale as it has been. 11/29/2021: The wounds are roughly the same size this week, perhaps a millimeter or 2 smaller. The medial wound has filled in and is nearly flush with the surrounding skin surface. She continues to have a lot of slough accumulation on both surfaces. 12/06/2021: No significant change to her wounds, but she has a new opening on her dorsal foot, just distal to the right lateral ankle wound. The area on her left medial ankle that reopened looks a little bit larger today. She has quite a bit of pain associated with the new wound. 12/12/2021: Her  wounds look about the same but the new opening on her right lateral dorsal foot is a little bit bigger. She continues to have a fair amount of pain with this wound. 12/26/2021: The left medial ankle wound is tiny and superficial. She has 2 areas of crusting on her left lateral ankle, however, that appear to be threatening to open again. Her right medial ankle wound is a little bit smaller today but still continues to accumulate thick rubbery slough. The new dorsal foot wound is exquisitely painful but there is no odor or purulent drainage. No erythema or induration. The right lateral ankle wound looks about the same today, again with thick rubbery slough. ARDINE, DULAY ([id number] Page 11 of 16 01/03/2022: The left medial ankle wound has closed again. Both right ankle wounds appear to be about the same size with thick rubbery slough. The dorsal foot wound on the right continues to be quite painful and she stated that she did not want any debridement of that site today. 01/10/2022: No real change to any of her wounds. She continues to accumulate thick slough. The dorsal foot wound has merged with the lateral malleolar wound. She is  experiencing significant pain in the dorsal foot portion of the ulcer. 01/16/2022: Absolutely no change or progress in her wounds. 01/24/2022: Her wounds are unchanged. She continues to build up slough and the wounds on her dorsal right foot are still exquisitely tender. 01/31/2022: The wounds actually measure a little bit narrower today. They still have thick slough on the surface but the underlying tissue seems a little less fibrotic. We changed to Iodosorb last week. 02/07/2022: Wounds continue to slowly epithelialize around the parameters. She has less pain today. She still accumulates a fairly substantial layer of slough. 02/15/2022: No change to the wounds overall. The measured a little bit larger today per the intake nurse. She continues to have substantial slough accumulation and her pain is a little bit worse. 02/21/2022. No change at all to her wounds. I do not see that plastic surgery has received her referral yet. 02/28/2022: The wounds remain unchanged. They are dry and fibrotic with accumulation of slough and eschar. She did receive an appointment to see plastic surgery on October 23, but the office called her back and indicated they needed to reschedule and that the next available appointment was not until December. The patient became angry and decided she did not want to see this plastics group. 10/19; the patient sees Dr. Lovett Calender again next week. She is using Medihoney as the primary dressing changing this herself. 03/21/2022: She saw Dr. Jaclynn Guarneri at the wound care center at [facility]. Dr. Jaclynn Guarneri was also unable to debride her, secondary to pain and is planning to take her to the operating room for operative debridement and potential skin substitute placement. Her wounds are unchanged with thick yellow slough and a fibrotic base. She says they are too painful to be debrided. In addition, yesterday her hemoglobin was 4 and she received 2 units packed red blood cell  transfusion. 04/04/2022: Her operation at [facility] is scheduled for December 15. She continues to use Medihoney on her wounds. They are a little bit less painful today and she is willing to entertain the possibility of debridement. The wounds measured a little bit larger in all dimensions today but the layer of slough is not as thick as usual. 04/18/2022: Her wounds actually measured a little bit smaller today and the extension onto the dorsal foot  from the lateral wound has healed. They are less tender and there is significantly less slough present as compared to prior visits. 05/09/2022: She had to reschedule her surgery due to lack of childcare. It is now planned for January 5. The wounds are basically unchanged, but she had a lot more drainage, which was thick and somewhat purulent in appearance. No significant odor. 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 They are quite painful today and she does not want to have debridement performed. 05/30/2022: The patient underwent surgical debridement and placement of TheraSkin to her wounds on January 5. She has been doing well and does not endorse any pain. The TheraSkin is intact and looks beautiful. It is sutured in place. There is no exudate or significant drainage. 06/04/2022: Her TheraSkin remains intact and I am starting to see granulation tissue emerging underneath the surface. No concern for infection. 06/12/2022: The TheraSkin is intact and there is more granulation tissue emerging. It is starting to look a little bit dry, however. No malodor or purulent drainage. 06/19/2022: The TheraSkin continues to look good. The edges are a bit dry, but the central portion of each of her wounds is showing a nice pink color. 06/27/2022: The TheraSkin on the lateral wound remains almost completely intact. There is nice pink tissue peaking through the mesh of the TheraSkin. On the medial leg, it is starting to breakdown a little bit, but most of the  TheraSkin remains intact here, as well. Both wounds measured smaller today. 07/03/2022: The wounds are fairly dry around the perimeter today. Some of the suture is hanging loose. 07/12/2022: Both wounds are measuring smaller today. The medial ulcer is getting a bit too dry. The lateral ulcer continues to have nice buds of granulation tissue emerging. 07/26/2022: The moisture balance on both wounds is much better today. For the first time since I began seeing her, I see actual beefy red granulation tissue on the lateral ankle wound. There are more buds of deep pink granulation tissue emerging on the medial ankle. There is some dry eschar around the edges of the medial wound and a tiny amount of slough overlying the good granulation tissue on the lateral wound. Patient History Information obtained from Patient. Family History Diabetes - Mother, Lung Disease - Mother, No family history of Cancer, Heart Disease, Hereditary Spherocytosis, Hypertension, Kidney Disease, Seizures, Stroke, Thyroid Problems, Tuberculosis. Social History Never smoker, Marital Status - Married, Alcohol Use - Never, Drug Use - No History, Caffeine Use - Daily. Medical History Eyes Denies history of Cataracts, Glaucoma, Optic Neuritis Ear/Nose/Mouth/Throat Denies history of Chronic sinus problems/congestion, Middle ear problems Hematologic/Lymphatic Patient has history of Anemia, Sickle Cell Disease Denies history of Hemophilia, Human Immunodeficiency Virus, Lymphedema Respiratory Denies history of Aspiration, Asthma, Chronic Obstructive Pulmonary Disease (COPD), Pneumothorax, Sleep Apnea, Tuberculosis Cardiovascular Denies history of Angina, Arrhythmia, Congestive Heart Failure, Coronary Artery Disease, Deep Vein Thrombosis, Hypertension, Hypotension, Myocardial Infarction, Peripheral Arterial Disease, Peripheral Venous Disease, Phlebitis, Vasculitis Gastrointestinal Denies history of Cirrhosis , Colitis, Crohnoos,  Hepatitis A, Hepatitis B, Hepatitis C Endocrine Denies history of Type I Diabetes, Type II Diabetes TYRELL, DELPINO ([id number] Page 12 of 16 Genitourinary Denies history of End Stage Renal Disease Immunological Denies history of Lupus Erythematosus, Raynaudoos, Scleroderma Integumentary (Skin) Denies history of History of Burn Musculoskeletal Denies history of Gout, Rheumatoid Arthritis, Osteoarthritis, Osteomyelitis Neurologic Patient has history of Neuropathy - right foot intermittant Denies history of Dementia, Quadriplegia, Paraplegia, Seizure Disorder Oncologic Denies history of Received Chemotherapy, Received  Radiation Psychiatric Denies history of Anorexia/bulimia, Confinement Anxiety Hospitalization/Surgery History - c section x2. - left breast lumpectomy. - iandD right ankle with theraskin. Medical A Surgical History Notes nd Constitutional Symptoms (General Health) H/O miscarriage Cardiovascular bradycardia Gastrointestinal cholilithiasis Objective Constitutional no acute distress. Vitals Time Taken: 9:06 AM, Height: 67 in, Weight: 134 lbs, BMI: 21, Temperature: 98.4 F, Pulse: 88 bpm, Respiratory Rate: 16 breaths/min, Blood Pressure: 130/69 mmHg. Respiratory Normal work of breathing on room air. General Notes: 07/26/2022: The moisture balance on both wounds is much better today. For the first time since I began seeing her, I see actual beefy red granulation tissue on the lateral ankle wound. There are more buds of deep pink granulation tissue emerging on the medial ankle. There is some dry eschar around the edges of the medial wound and a tiny amount of slough overlying the good granulation tissue on the lateral wound. Integumentary (Hair, Skin) Wound #17 status is Open. Original cause of wound was Gradually Appeared. The date acquired was: 10/05/2012. The wound has been in treatment 66 weeks. The wound is located on the  Right,Lateral Lower Leg. The wound measures 6.5cm length x 5cm width x 0.2cm depth; 25.525cm^2 area and 5.105cm^3 volume. There is Fat Layer (Subcutaneous Tissue) exposed. There is no tunneling or undermining noted. There is a medium amount of serosanguineous drainage noted. The wound margin is distinct with the outline attached to the wound base. There is medium (34-66%) pink granulation within the wound bed. There is a medium (34-66%) amount of necrotic tissue within the wound bed including Adherent Slough. The periwound skin appearance had no abnormalities noted for moisture. The periwound skin appearance exhibited: Scarring. The periwound skin appearance did not exhibit: Ecchymosis. Periwound temperature was noted as No Abnormality. The periwound has tenderness on palpation. Wound #21 status is Open. Original cause of wound was Gradually Appeared. The date acquired was: 06/26/2021. The wound has been in treatment 56 weeks. The wound is located on the Right,Medial Ankle. The wound measures 7.2cm length x 3.4cm width x 0.1cm depth; 19.227cm^2 area and 1.923cm^3 volume. There is Fat Layer (Subcutaneous Tissue) exposed. There is no tunneling or undermining noted. There is a medium amount of serosanguineous drainage noted. The wound margin is distinct with the outline attached to the wound base. There is medium (34-66%) pink granulation within the wound bed. There is a medium (34-66%) amount of necrotic tissue within the wound bed including Adherent Slough. The periwound skin appearance had no abnormalities noted for moisture. The periwound skin appearance had no abnormalities noted for color. The periwound skin appearance exhibited: Scarring. Periwound temperature was noted as No Abnormality. The periwound has tenderness on palpation. 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 There was a Selective/Open Wound Non-Viable Tissue Debridement with a total area of 0.25 sq cm performed by Fredirick Maudlin, MD. With the following instrument(s): Curette to remove Non-Viable tissue/material. Material removed includes [facility] after achieving pain control using Lidocaine 4% Topical Solution. No specimens were taken. A time out was conducted at 09:19, prior to the start of the procedure. A Minimum amount of bleeding was controlled with Pressure. The procedure was tolerated well. Post Debridement Measurements: 6.5cm length x 5cm width x 0.2cm depth; 5.105cm^3 volume. Character of Wound/Ulcer  Post Debridement is improved. Post procedure Diagnosis Wound #17: Same as Pre-Procedure General Notes: scribed for Dr. Celine Ahr by Adline Peals, RN. Wound #21 Pre-procedure diagnosis of Wound #21 is a Sickle Cell Lesion located on the Right,Medial Ankle .Severity of Tissue Pre Debridement is: Fat layer exposed. There was a Selective/Open Wound Non-Viable Tissue Debridement with a total area of 0.09 sq cm performed by Fredirick Maudlin, MD. With the following instrument(s): Curette to remove Non-Viable tissue/material. Material removed includes Eschar after achieving pain control using Lidocaine 4% Topical Solution. No specimens were taken. A time out was conducted at 09:19, prior to the start of the procedure. A Minimum amount of bleeding was controlled with Pressure. The procedure was tolerated well. Post Debridement Measurements: 7.2cm length x 3.4cm width x 0.1cm depth; 1.923cm^3 volume. Character of Wound/Ulcer Post Debridement is improved. Severity of Tissue Post Debridement is: Fat layer exposed. Post procedure Diagnosis Wound #21: Same as Pre-Procedure General Notes: scribed for  Dr. Celine Ahr by Adline Peals, RN. Plan Follow-up Appointments: Return Appointment in 2 weeks. - Dr. Celine Ahr - room 2 Anesthetic: (In clinic) Topical Lidocaine 4% applied to wound bed Cellular or Tissue Based Products: Other Cellular or Tissue Based Products Orders/Instructions: - Theraskin applied by Psychiatric nurse, leave on until further notice Bathing/ Shower/ Hygiene: Do not shower or bathe in tub. Edema Control - Lymphedema / SCD / Other: Avoid standing for long periods of time. Exercise regularly Additional Orders / Instructions: Follow Nutritious Diet The following medication(s) was prescribed: lidocaine topical 4 % cream cream topical was prescribed at facility WOUND #17: - Lower Leg Wound Laterality: Right, Lateral Cleanser: Soap and Water 2 x Per Day/30 Days Discharge Instructions: May shower and wash wound with dial antibacterial soap and water prior to dressing change. Cleanser: Wound Cleanser 2 x Per Day/30 Days Discharge Instructions: Cleanse the wound with wound cleanser prior to applying a clean dressing using gauze sponges, not tissue or cotton balls. Peri-Wound Care: Triamcinolone 15 (g) 2 x Per Day/30 Days Discharge Instructions: Use triamcinolone 15 (g) as directed Peri-Wound Care: Sween Lotion (Moisturizing lotion) 2 x Per Day/30 Days Discharge Instructions: Apply moisturizing lotion as directed Topical: Skintegrity Hydrogel 4 (oz) 2 x Per Day/30 Days Discharge Instructions: Apply hydrogel as directed Prim Dressing: ADAPTIC TOUCH 3x4.25 (in/in) 2 x Per Day/30 Days ary Discharge Instructions: Apply to wound bed as instructed Secondary Dressing: ABD Pad, 5x9 2 x Per Day/30 Days Discharge Instructions: Apply over primary dressing as directed. Secured With: Elastic Bandage 4 inch (ACE bandage) 2 x Per Day/30 Days Discharge Instructions: Secure with ACE bandage as directed. Com pression Wrap: Kerlix Roll 4.5x3.1 (in/yd) 2 x Per Day/30 Days Discharge Instructions:  Apply Kerlix and Coban compression as directed. WOUND #21: - Ankle Wound Laterality: Right, Medial Cleanser: Soap and Water 2 x Per Day/30 Days Discharge Instructions: May shower and wash wound with dial antibacterial soap and water prior to dressing change. Cleanser: Wound Cleanser 2 x Per Day/30 Days Discharge Instructions: Cleanse the wound with wound cleanser prior to applying a clean dressing using gauze sponges, not tissue or cotton balls. Peri-Wound Care: Triamcinolone 15 (g) 2 x Per Day/30 Days Discharge Instructions: Use triamcinolone 15 (g) as directed Peri-Wound Care: Sween Lotion (Moisturizing lotion) 2 x Per Day/30 Days Discharge Instructions: Apply moisturizing lotion as directed Prim Dressing: ADAPTIC TOUCH 3x4.25 (in/in) 2 x Per Day/30 Days ary Discharge Instructions: Apply to wound bed as instructed Secondary Dressing: ABD Pad, 5x9 2 x Per Day/30 Days Discharge Instructions: Apply over  primary dressing as directed. Secured With: Elastic Bandage 4 inch (ACE bandage) 2 x Per Day/30 Days Discharge Instructions: Secure with ACE bandage as directed. Com pression Wrap: Kerlix Roll 4.5x3.1 (in/yd) 2 x Per Day/30 Days Discharge Instructions: Apply Kerlix and Coban compression as directed. 07/26/2022: The moisture balance on both wounds is much better today. For the first time since I began seeing her, I see actual beefy red granulation tissue on the lateral ankle wound. There are more buds of deep pink granulation tissue emerging on the medial ankle. There is some dry eschar around the edges of the medial wound and a tiny amount of slough overlying the good granulation tissue on the lateral wound. I used a curette to debride the slough from the lateral wound and some eschar from the medial wound. We will continue to have her apply hydrogel twice a day with Adaptic, Kerlix, and an Ace bandage. She will follow-up in 2 weeks. AMBIKA, SCHUERMAN ([id number] Page 14 of 16 Electronic Signature(s) Signed: 07/26/2022 9:36:41 AM By: Fredirick Maudlin MD FACS Entered By: Fredirick Maudlin on 07/26/2022 09:36:41 -------------------------------------------------------------------------------- HxROS Details Patient Name: Date of Service: [name] [name], [name] 07/26/2022 9:15 A M Medical Record Number: [id number] Patient Account Number: [id number] Date of Birth/Sex: Treating RN: 07-12-1970 (52 y.o. F) Primary Care Provider: [name] Other Clinician: Referring Provider: Treating Provider/Extender: Elyse Jarvis Weeks in Treatment: 65 Information Obtained From Patient Constitutional Symptoms (General Health) Medical History: Past Medical History Notes: H/O miscarriage Eyes Medical History: Negative for: Cataracts; Glaucoma; Optic Neuritis Ear/Nose/Mouth/Throat Medical History: Negative for: Chronic sinus problems/congestion; Middle ear problems Hematologic/Lymphatic Medical History: Positive for: Anemia; Sickle Cell Disease Negative for: Hemophilia; Human Immunodeficiency Virus; Lymphedema Respiratory Medical History: Negative for: Aspiration; Asthma; Chronic Obstructive Pulmonary Disease (COPD); Pneumothorax; Sleep Apnea; Tuberculosis Cardiovascular Medical History: Negative for: Angina; Arrhythmia; Congestive Heart Failure; Coronary Artery Disease; Deep Vein Thrombosis; Hypertension; Hypotension; Myocardial Infarction; Peripheral Arterial Disease; Peripheral Venous Disease; Phlebitis; Vasculitis Past Medical History Notes: bradycardia Gastrointestinal Medical History: Negative for: Cirrhosis ; Colitis; Crohns; Hepatitis A; Hepatitis B; Hepatitis C Past Medical History Notes: cholilithiasis Endocrine Medical History: Negative for: Type I Diabetes; Type II Diabetes Genitourinary Medical History: Negative for: End Stage Renal Disease Immunological Medical History: Negative  for: Lupus Erythematosus; Raynauds; Scleroderma Integumentary (Skin) ASHLEYANNE, LASSITER ([id number]) [id number] Page 15 of 16 Medical History: Negative for: History of Burn Musculoskeletal Medical History: Negative for: Gout; Rheumatoid Arthritis; Osteoarthritis; Osteomyelitis Neurologic Medical History: Positive for: Neuropathy - right foot intermittant Negative for: Dementia; Quadriplegia; Paraplegia; Seizure Disorder Oncologic Medical History: Negative for: Received Chemotherapy; Received Radiation Psychiatric Medical History: Negative for: Anorexia/bulimia; Confinement Anxiety Immunizations Pneumococcal Vaccine: Received Pneumococcal Vaccination: No Implantable Devices None Hospitalization / Surgery History Type of Hospitalization/Surgery c section x2 left breast lumpectomy iandD right ankle with theraskin Family and Social History Cancer: No; Diabetes: Yes - Mother; Heart Disease: No; Hereditary Spherocytosis: No; Hypertension: No; Kidney Disease: No; Lung Disease: Yes - Mother; Seizures: No; Stroke: No; Thyroid Problems: No; Tuberculosis: No; Never smoker; Marital Status - Married; Alcohol Use: Never; Drug Use: No History; Caffeine Use: Daily; Financial Concerns: No; Food, Clothing or Shelter Needs: No; Support System Lacking: No; Transportation Concerns: No Electronic Signature(s) Signed: 07/26/2022 9:37:54 AM By: Fredirick Maudlin MD FACS Entered By: Fredirick Maudlin on 07/26/2022 09:34:53 -------------------------------------------------------------------------------- SuperBill Details Patient Name: Date of Service: [name] [name], [name] 07/26/2022 Medical Record Number: [id number] Patient Account Number: [id number] Date of Birth/Sex: Treating RN: [birth year] (52 y.o. F) Primary Care  Provider: [name] Other Clinician: Referring Provider: Treating Provider/Extender: Elyse Jarvis Weeks in Treatment: 66 Diagnosis  Coding ICD-10 Codes Code Description L97.818 Non-pressure chronic ulcer of other part of right lower leg with other specified severity 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 Facility Procedures Physician Procedures : CPT4 Code Description Modifier [id number] - WC PHYS LEVEL 3 - EST PT 25 ICD-10 Diagnosis Description L97.818 Non-pressure chronic ulcer of other part of right lower leg with other specified severity 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 Quantity: 1 : [id number] - WC PHYS DEBR WO ANESTH 20 SQ CM ICD-10 Diagnosis Description L97.818 Non-pressure chronic ulcer of other part of right lower leg with other specified severity Quantity: 1 Electronic Signature(s) Signed: 07/26/2022 9:37:30 AM By: Fredirick Maudlin MD FACS Entered By: Fredirick Maudlin on 07/26/2022 09:37:29

## 2022-08-01 ENCOUNTER — Other Ambulatory Visit: Payer: Self-pay | Admitting: Family Medicine

## 2022-08-01 DIAGNOSIS — D571 Sickle-cell disease without crisis: Secondary | ICD-10-CM

## 2022-08-01 NOTE — Telephone Encounter (Signed)
Please advise due to [place] being out of the office. [place]

## 2022-08-09 ENCOUNTER — Telehealth: Payer: Self-pay | Admitting: Family Medicine

## 2022-08-09 ENCOUNTER — Encounter (HOSPITAL_BASED_OUTPATIENT_CLINIC_OR_DEPARTMENT_OTHER): Payer: Medicaid Other | Admitting: General Surgery

## 2022-08-09 DIAGNOSIS — L97818 Non-pressure chronic ulcer of other part of right lower leg with other specified severity: Secondary | ICD-10-CM | POA: Diagnosis not present

## 2022-08-09 NOTE — Telephone Encounter (Signed)
Caller & Relationship to patient:  MRN #  [id number]   Call Back Number:   Date of Last Office Visit: 08/01/2022     Date of Next Office Visit: 09/24/2022    Medication(s) to be Refilled: Oxycodone   Preferred Pharmacy:   ** Please notify patient to allow 48-72 hours to process** **Let patient know to contact pharmacy at the end of the day to make sure medication is ready. ** **If patient has not been seen in a year or longer, book an appointment **Advise to use MyChart for refill requests OR to contact their pharmacy

## 2022-08-10 NOTE — Progress Notes (Signed)
[name], [name] ([id number]) [id number] Page 1 of 11 Visit Report for 08/09/2022 Arrival Information Details Patient Name: Date of Service: [name] [name] 08/09/2022 10:00 A M Medical Record Number: [id number] Patient Account Number: [id number] Date of Birth/Sex: Treating RN: Aug 25, 1970 (52 y.o. F) Primary Care Bradin Mcadory: [name] Other Clinician: Referring Kyrianna Barletta: Treating Yashas Camilli/Extender: Elyse Jarvis Weeks in Treatment: 79 Visit Information History Since Last Visit All ordered tests and consults were completed: No Patient Arrived: [name] Added or deleted any medications: No Arrival Time: 10:06 Any new allergies or adverse reactions: No Accompanied By: self Had a fall or experienced change in No Transfer Assistance: None activities of daily living that may affect Patient Identification Verified: Yes risk of falls: Secondary Verification Process Completed: Yes Signs or symptoms of abuse/neglect since last visito No Patient Requires Transmission-Based Precautions: No Hospitalized since last visit: No Patient Has Alerts: No Implantable device outside of the clinic excluding No cellular tissue based products placed in the center since last visit: Pain Present Now: No Electronic Signature(s) Signed: 08/09/2022 10:34:37 AM By: Worthy Rancher Entered By: Worthy Rancher on 08/09/2022 10:06:28 -------------------------------------------------------------------------------- Compression Therapy Details Patient Name: Date of Service: [name] [name], [name] 08/09/2022 10:00 A M Medical Record Number: [id number] Patient Account Number: [id number] Date of Birth/Sex: Treating RN: 1971/02/14 (52 y.o. [name] Primary Care Elfa Wooton: [name] Other Clinician: Referring Dareen Gutzwiller: Treating Chukwuemeka Artola/Extender: Elyse Jarvis Weeks in Treatment: 8 Compression Therapy Performed for Wound Assessment: Wound #21  Right,Medial Ankle Performed By: Clinician Adline Peals, RN Compression Type: Double Layer Post Procedure Diagnosis Same as Pre-procedure Electronic Signature(s) Signed: 08/09/2022 3:34:35 PM By: Adline Peals Entered By: Adline Peals on 08/09/2022 11:13:54 -------------------------------------------------------------------------------- Compression Therapy Details Patient Name: Date of Service: [name], [name] 08/09/2022 10:00 A M Medical Record Number: [id number] Patient Account Number: [id number] Date of Birth/Sex: Treating RN: [birth year] (52 y.o. [name] Primary Care Tinleigh Whitmire: [name] Other Clinician: Referring Puanani Gene: Treating [name]/Extender: Elyse Jarvis Weeks in Treatment: 45 Compression Therapy Performed for Wound Assessment: Wound #24 Left,Lateral Ankle Performed By: Clinician Adline Peals, RN Compression Type: Double Layer Post Procedure Diagnosis Same as Remus Loffler ([id number] Page 2 of 11 Electronic Signature(s) Signed: 08/09/2022 3:34:35 PM By: [name] By: Adline Peals on 08/09/2022 11:13:54 -------------------------------------------------------------------------------- Encounter Discharge Information Details Patient Name: Date of Service: [name], [name] 08/09/2022 10:00 A M Medical Record Number: [id number] Patient Account Number: [id number] Date of Birth/Sex: Treating RN: [birth year] (52 y.o. [name] Primary Care [name]: [name] Other Clinician: Referring Alexus Michael: Treating [name]/Extender: Elyse Jarvis Weeks in Treatment: 20 Encounter Discharge Information Items Post Procedure Vitals Discharge Condition: Stable Temperature (F): 97.7 Ambulatory Status: Cane Pulse (bpm): 89 Discharge Destination: Home Respiratory Rate (breaths/min): 20 Transportation: Private  Auto Blood Pressure (mmHg): 138/68 Accompanied By: self Schedule Follow-up Appointment: Yes Clinical Summary of Care: Patient Declined Electronic Signature(s) Signed: 08/09/2022 3:34:35 PM By: Adline Peals Entered By: Adline Peals on 08/09/2022 11:13:36 -------------------------------------------------------------------------------- Lower Extremity Assessment Details Patient Name: Date of Service: [name], [place] [name] 08/09/2022 10:00 A M Medical Record Number: [id number] Patient Account Number: [id number] Date of Birth/Sex: Treating RN: 09/12/1970 (52 y.o. [name] Primary Care [name]: [name] Other Clinician: Referring [name]: Treating [name]/Extender: Elyse Jarvis Weeks in Treatment: 68 Edema Assessment Assessed: [Left: No] [Right: No] Edema: [Left: Ye] [Right: s] Calf Left: Right: Point of Measurement: 33 cm From Medial Instep 29.5 cm  31 cm Ankle Left: Right: Point of Measurement: 10 cm From Medial Instep 19 cm 18.5 cm Vascular Assessment Pulses: Dorsalis Pedis Palpable: [Left:Yes] [Right:Yes] Electronic Signature(s) Signed: 08/09/2022 3:34:35 PM By: Adline Peals Entered By: Adline Peals on 08/09/2022 10:29:10 -------------------------------------------------------------------------------- Multi Wound Chart Details Patient Name: Date of Service: [name] [name], [name] 08/09/2022 10:00 A Gardiner Sleeper ([id number]) [id number] Page 3 of 11 Medical Record Number: [id number] Patient Account Number: [id number] Date of Birth/Sex: Treating RN: [birth year] (52 y.o. F) Primary Care [name]: [name] Other Clinician: Referring [name]: Treating [name]/Extender: Elyse Jarvis Weeks in Treatment: 46 Vital Signs Height(in): 67 Pulse(bpm): 89 Weight(lbs): 134 Blood Pressure(mmHg): 138/68 Body Mass Index(BMI): 21 Temperature(F): 97.7 Respiratory  Rate(breaths/min): 20 [17:Photos:] Right, Lateral Lower Leg Right, Medial Ankle Left Ankle Wound Location: Gradually Appeared Gradually Appeared Not Known Wounding Event: Sickle Cell Lesion Sickle Cell Lesion T be determined o Primary Etiology: N/A Venous Leg Ulcer N/A Secondary Etiology: Anemia, Sickle Cell Disease, Anemia, Sickle Cell Disease, Anemia, Sickle Cell Disease, Comorbid History: Neuropathy Neuropathy Neuropathy 10/05/2012 06/26/2021 08/06/2022 Date Acquired: 68 34 0 Weeks of Treatment: Open Open Open Wound Status: No No No Wound Recurrence: Yes No No Clustered Wound: 6.8x5x0.2 6.8x3x0.1 1.5x1.1x0.1 Measurements L x W x D (cm) 26.704 16.022 1.296 A (cm) : rea 5.341 1.602 0.13 Volume (cm) : 85.80% 44.70% N/A % Reduction in A rea: 71.60% 44.70% N/A % Reduction in Volume: Full Thickness Without Exposed Full Thickness Without Exposed Full Thickness Without Exposed Classification: Support Structures Support Structures Support Structures Medium Medium Medium Exudate A mount: Serosanguineous Serosanguineous Serous Exudate Type: red, brown red, brown amber Exudate Color: Distinct, outline attached Distinct, outline attached Distinct, outline attached Wound Margin: Medium (34-66%) Medium (34-66%) Small (1-33%) Granulation A mount: Pink Pink Pink Granulation Quality: Medium (34-66%) Medium (34-66%) Large (67-100%) Necrotic A mount: Fat Layer (Subcutaneous Tissue): Yes Fat Layer (Subcutaneous Tissue): Yes Fat Layer (Subcutaneous Tissue): Yes Exposed Structures: Fascia: No Fascia: No Fascia: No Tendon: No Tendon: No Tendon: No Muscle: No Muscle: No Muscle: No Joint: No Joint: No Joint: No Bone: No Bone: No Bone: No Small (1-33%) Small (1-33%) None Epithelialization: Debridement - Selective/Open Wound Debridement - Selective/Open Wound N/A Debridement: Pre-procedure Verification/Time Out 10:40 10:40 N/A Taken: Lidocaine 4% Topical Solution  Lidocaine 4% Topical Solution N/A Pain Control: Necrotic/Eschar, Psychologist, prison and probation services, Slough N/A Tissue Debrided: Non-Viable Tissue Non-Viable Tissue N/A Level: 20 12 N/A Debridement A (sq cm): rea Curette Curette N/A Instrument: Minimum Minimum N/A Bleeding: Pressure Pressure N/A Hemostasis A chieved: Procedure was tolerated well Procedure was tolerated well N/A Debridement Treatment Response: 6.8x5x0.2 6.8x3x0.1 N/A Post Debridement Measurements L x W x D (cm) 5.341 1.602 N/A Post Debridement Volume: (cm) Scarring: Yes Scarring: Yes No Abnormalities Noted Periwound Skin Texture: No Abnormalities Noted No Abnormalities Noted No Abnormalities Noted Periwound Skin Moisture: Ecchymosis: No No Abnormalities Noted No Abnormalities Noted Periwound Skin Color: No Abnormality No Abnormality No Abnormality Temperature: Yes Yes Yes Tenderness on Palpation: Debridement Compression Therapy Compression Therapy Procedures Performed: Debridement Treatment Notes EMANEE, TARRICONE ([id number]) [id number] Page 4 of 11 Wound #17 (Lower Leg) Wound Laterality: Right, Lateral Cleanser Soap and Water Discharge Instruction: May shower and wash wound with dial antibacterial soap and water prior to dressing change. Wound Cleanser Discharge Instruction: Cleanse the wound with wound cleanser prior to applying a clean dressing using gauze sponges, not tissue or cotton balls. Peri-Wound Care Triamcinolone 15 (g) Discharge Instruction: Use triamcinolone 15 (g) as directed Sween Lotion (Moisturizing lotion) Discharge Instruction:  Apply moisturizing lotion as directed Topical Skintegrity Hydrogel 4 (oz) Discharge Instruction: Apply hydrogel as directed Primary Dressing Promogran Prisma Matrix, 4.34 (sq in) (silver collagen) Discharge Instruction: Moisten collagen with saline or hydrogel Secondary Dressing ABD Pad, 5x9 Discharge Instruction: Apply over primary  dressing as directed. Secured With Compression Wrap Urgo K2 Lite, two layer compression system, regular Discharge Instruction: Apply Urgo K2 Lite as directed (alternative to 3 layer compression). Compression Stockings Add-Ons Wound #21 (Ankle) Wound Laterality: Right, Medial Cleanser Peri-Wound Care Triamcinolone 15 (g) Discharge Instruction: Use triamcinolone 15 (g) as directed Sween Lotion (Moisturizing lotion) Discharge Instruction: Apply moisturizing lotion as directed Topical Skintegrity Hydrogel 4 (oz) Discharge Instruction: Apply hydrogel as directed Primary Dressing Promogran Prisma Matrix, 4.34 (sq in) (silver collagen) Discharge Instruction: Moisten collagen with saline or hydrogel Secondary Dressing ABD Pad, 5x9 Discharge Instruction: Apply over primary dressing as directed. Secured With Compression Wrap Urgo K2 Lite, two layer compression system, regular Discharge Instruction: Apply Urgo K2 Lite as directed (alternative to 3 layer compression). Compression Stockings Add-Ons Wound #24 (Ankle) Wound Laterality: Left, Lateral Cleanser Soap and Water Discharge Instruction: May shower and wash wound with dial antibacterial soap and water prior to dressing change. Wound Cleanser KEAJAH, MIESNER ([id number]) [id number] Page 5 of 11 Discharge Instruction: Cleanse the wound with wound cleanser prior to applying a clean dressing using gauze sponges, not tissue or cotton balls. Peri-Wound Care Triamcinolone 15 (g) Discharge Instruction: Use triamcinolone 15 (g) as directed Sween Lotion (Moisturizing lotion) Discharge Instruction: Apply moisturizing lotion as directed Topical Primary Dressing IODOFLEX 0.9% Cadexomer Iodine Pad 4x6 cm Discharge Instruction: Apply to wound bed as instructed Secondary Dressing ABD Pad, 5x9 Discharge Instruction: Apply over primary dressing as directed. Secured With Compression Wrap Urgo K2 Lite, two layer  compression system, regular Discharge Instruction: Apply Urgo K2 Lite as directed (alternative to 3 layer compression). Compression Stockings Add-Ons Electronic Signature(s) Signed: 08/09/2022 11:23:50 AM By: Fredirick Maudlin MD FACS Entered By: Fredirick Maudlin on 08/09/2022 11:23:49 -------------------------------------------------------------------------------- Multi-Disciplinary Care Plan Details Patient Name: Date of Service: [name] [name], [name] 08/09/2022 10:00 A M Medical Record Number: [id number] Patient Account Number: [id number] Date of Birth/Sex: Treating RN: 1970-06-01 (52 y.o. [name] Primary Care Zafirah Vanzee: [name] Other Clinician: Referring Makari Portman: Treating Kashtyn Jankowski/Extender: Elyse Jarvis Weeks in Treatment: 70 Multidisciplinary Care Plan reviewed with physician Active Inactive Venous Leg Ulcer Nursing Diagnoses: Actual venous Insuffiency (use after diagnosis is confirmed) Knowledge deficit related to disease process and management Goals: Patient will maintain optimal edema control Date Initiated: 06/26/2021 Target Resolution Date: 08/15/2022 Goal Status: Active Interventions: Assess peripheral edema status every visit. Compression as ordered Treatment Activities: Therapeutic compression applied : 06/26/2021 Notes: Wound/Skin Impairment Nursing Diagnoses: Impaired tissue integrity [name], [name] ([id number]) [id number] Page 6 of 11 Goals: Patient/caregiver will verbalize understanding of skin care regimen Date Initiated: 04/17/2021 Target Resolution Date: 08/15/2022 Goal Status: Active Ulcer/skin breakdown will have a volume reduction of 30% by week 4 Date Initiated: 04/17/2021 Date Inactivated: 05/29/2021 Target Resolution Date: 05/15/2021 Goal Status: Met Ulcer/skin breakdown will have a volume reduction of 50% by week 8 Date Initiated: 05/29/2021 Date Inactivated: 06/26/2021 Target Resolution  Date: 06/26/2021 Goal Status: Unmet Unmet Reason: venous reflux Interventions: Assess patient/caregiver ability to obtain necessary supplies Assess patient/caregiver ability to perform ulcer/skin care regimen upon admission and as needed Assess ulceration(s) every visit Provide education on ulcer and skin care Treatment Activities: Topical wound management initiated : 04/17/2021 Notes: 06/08/21: Left leg wounds greater than 30% volume reduction,  right leg acute infection. Electronic Signature(s) Signed: 08/09/2022 3:34:35 PM By: [name] By: Adline Peals on 08/09/2022 10:36:42 -------------------------------------------------------------------------------- Pain Assessment Details Patient Name: Date of Service: [name], [place] [name] 08/09/2022 10:00 A M Medical Record Number: [id number] Patient Account Number: [id number] Date of Birth/Sex: Treating RN: 1971-05-06 (52 y.o. F) Primary Care Wendelin Bradt: [name] Other Clinician: Referring Braelin Costlow: Treating Latrel Szymczak/Extender: Elyse Jarvis Weeks in Treatment: 49 Active Problems Location of Pain Severity and Description of Pain Patient Has Paino Yes Site Locations Rate the pain. Current Pain Level: 3 Worst Pain Level: 10 Least Pain Level: 0 Tolerable Pain Level: 1 Pain Management and Medication Current Pain Management: Electronic Signature(s) Signed: 08/09/2022 10:34:37 AM By: Worthy Rancher Entered By: Worthy Rancher on 08/09/2022 10:07:05 [name] ([id number]) [id number] Page 7 of 11 -------------------------------------------------------------------------------- Patient/Caregiver Education Details Patient Name: Date of Service: [name], [place] [name] 3/22/2024andnbsp10:00 A M Medical Record Number: [id number] Patient Account Number: [id number] Date of Birth/Gender: Treating RN: 08/10/1970 (52 y.o. [name] Primary Care Physician: [name] Other  Clinician: Referring Physician: Treating Physician/Extender: Elyse Jarvis Weeks in Treatment: 22 Education Assessment Education Provided To: Patient Education Topics Provided Wound/Skin Impairment: Methods: Explain/Verbal Responses: Reinforcements needed, State content correctly Electronic Signature(s) Signed: 08/09/2022 3:34:35 PM By: Adline Peals Entered By: Adline Peals on 08/09/2022 10:37:57 -------------------------------------------------------------------------------- Wound Assessment Details Patient Name: Date of Service: [name], [name] 08/09/2022 10:00 A M Medical Record Number: [id number] Patient Account Number: [id number] Date of Birth/Sex: Treating RN: [birth year] (52 y.o. F) Primary Care Christopher Glasscock: [name] Other Clinician: Referring Macel Yearsley: Treating Arlee Bossard/Extender: Elyse Jarvis Weeks in Treatment: 68 Wound Status Wound Number: 17 Primary Etiology: Sickle Cell Lesion Wound Location: Right, Lateral Lower Leg Wound Status: Open Wounding Event: Gradually Appeared Comorbid History: Anemia, Sickle Cell Disease, Neuropathy Date Acquired: 10/05/2012 Weeks Of Treatment: 68 Clustered Wound: Yes Photos Wound Measurements Length: (cm) 6.8 Width: (cm) 5 Depth: (cm) 0.2 Area: (cm) 26.704 Volume: (cm) 5.341 % Reduction in Area: 85.8% % Reduction in Volume: 71.6% Epithelialization: Small (1-33%) Tunneling: No Undermining: No Wound Description Classification: Full Thickness Without Exposed Support Structures Wound Margin: Distinct, outline attached Exudate Amount: Medium Exudate Type: Serosanguineous Exudate Color: red, brown Streb, Legrand Rams ([id number]) Foul Odor After Cleansing: No Slough/Fibrino Yes [id number].pdf Page 8 of 11 Wound Bed Granulation Amount: Medium (34-66%) Exposed Structure Granulation Quality: Pink Fascia Exposed: No Necrotic Amount: Medium (34-66%) Fat  Layer (Subcutaneous Tissue) Exposed: Yes Necrotic Quality: Adherent Slough Tendon Exposed: No Muscle Exposed: No Joint Exposed: No Bone Exposed: No Periwound Skin Texture Texture Color No Abnormalities Noted: No No Abnormalities Noted: No Scarring: Yes Ecchymosis: No Moisture Temperature / Pain No Abnormalities Noted: Yes Temperature: No Abnormality Tenderness on Palpation: Yes Treatment Notes Wound #17 (Lower Leg) Wound Laterality: Right, Lateral Cleanser Soap and Water Discharge Instruction: May shower and wash wound with dial antibacterial soap and water prior to dressing change. Wound Cleanser Discharge Instruction: Cleanse the wound with wound cleanser prior to applying a clean dressing using gauze sponges, not tissue or cotton balls. Peri-Wound Care Triamcinolone 15 (g) Discharge Instruction: Use triamcinolone 15 (g) as directed Sween Lotion (Moisturizing lotion) Discharge Instruction: Apply moisturizing lotion as directed Topical Skintegrity Hydrogel 4 (oz) Discharge Instruction: Apply hydrogel as directed Primary Dressing Promogran Prisma Matrix, 4.34 (sq in) (silver collagen) Discharge Instruction: Moisten collagen with saline or hydrogel Secondary Dressing ABD Pad, 5x9 Discharge Instruction: Apply over primary dressing as directed. Secured With Compression Wrap Urgo K2 Lite,  two layer compression system, regular Discharge Instruction: Apply Urgo K2 Lite as directed (alternative to 3 layer compression). Compression Stockings Add-Ons Electronic Signature(s) Signed: 08/09/2022 3:34:35 PM By: Adline Peals Entered By: Adline Peals on 08/09/2022 10:32:04 -------------------------------------------------------------------------------- Wound Assessment Details Patient Name: Date of Service: [name], [name] 08/09/2022 10:00 A M Medical Record Number: [id number] Patient Account Number: [id number] Date of Birth/Sex: Treating RN: [birth year] (52 y.o.  F) Primary Care Esgar Barnick: [name] Other Clinician: Referring Kalise Fickett: Treating Jeda Pardue/Extender: Elyse Jarvis Weeks in Treatment: [address] Wound Status [place], Legrand Rams ([id number]) [id number] Page 9 of 11 Wound Number: 21 Primary Etiology: Sickle Cell Lesion Wound Location: Right, Medial Ankle Secondary Etiology: Venous Leg Ulcer Wounding Event: Gradually Appeared Wound Status: Open Date Acquired: 06/26/2021 Comorbid History: Anemia, Sickle Cell Disease, Neuropathy Weeks Of Treatment: 58 Clustered Wound: No Photos Wound Measurements Length: (cm) 6.8 Width: (cm) 3 Depth: (cm) 0.1 Area: (cm) 16.022 Volume: (cm) 1.602 % Reduction in Area: 44.7% % Reduction in Volume: 44.7% Epithelialization: Small (1-33%) Tunneling: No Undermining: No Wound Description Classification: Full Thickness Without Exposed Support Structures Wound Margin: Distinct, outline attached Exudate Amount: Medium Exudate Type: Serosanguineous Exudate Color: red, brown Foul Odor After Cleansing: No Slough/Fibrino Yes Wound Bed Granulation Amount: Medium (34-66%) Exposed Structure Granulation Quality: Pink Fascia Exposed: No Necrotic Amount: Medium (34-66%) Fat Layer (Subcutaneous Tissue) Exposed: Yes Necrotic Quality: Adherent Slough Tendon Exposed: No Muscle Exposed: No Joint Exposed: No Bone Exposed: No Periwound Skin Texture Texture Color No Abnormalities Noted: No No Abnormalities Noted: Yes Scarring: Yes Temperature / Pain Temperature: No Abnormality Moisture No Abnormalities Noted: Yes Tenderness on Palpation: Yes Treatment Notes Wound #21 (Ankle) Wound Laterality: Right, Medial Cleanser Peri-Wound Care Triamcinolone 15 (g) Discharge Instruction: Use triamcinolone 15 (g) as directed Sween Lotion (Moisturizing lotion) Discharge Instruction: Apply moisturizing lotion as directed Topical Skintegrity Hydrogel 4 (oz) Discharge  Instruction: Apply hydrogel as directed Primary Dressing Promogran Prisma Matrix, 4.34 (sq in) (silver collagen) Discharge Instruction: Moisten collagen with saline or hydrogel Secondary Dressing ABD Pad, 5x9 [place], Legrand Rams ([id number]) [id number] Page 10 of 11 Discharge Instruction: Apply over primary dressing as directed. Secured With Compression Wrap Urgo K2 Lite, two layer compression system, regular Discharge Instruction: Apply Urgo K2 Lite as directed (alternative to 3 layer compression). Compression Stockings Add-Ons Electronic Signature(s) Signed: 08/09/2022 3:34:35 PM By: Adline Peals Entered By: Adline Peals on 08/09/2022 10:32:36 -------------------------------------------------------------------------------- Wound Assessment Details Patient Name: Date of Service: [name], [name] 08/09/2022 10:00 A M Medical Record Number: [id number] Patient Account Number: [id number] Date of Birth/Sex: Treating RN: June 06, 1970 (52 y.o. F) Primary Care Olie Scaffidi: [name] Other Clinician: Referring Marris Frontera: Treating Niva Murren/Extender: Elyse Jarvis Weeks in Treatment: 68 Wound Status Wound Number: 24 Primary Etiology: T be determined o Wound Location: Left Ankle Wound Status: Open Wounding Event: Not Known Comorbid History: Anemia, Sickle Cell Disease, Neuropathy Date Acquired: 08/06/2022 Weeks Of Treatment: 0 Clustered Wound: No Photos Wound Measurements Length: (cm) 1.5 Width: (cm) 1.1 Depth: (cm) 0.1 Area: (cm) 1.296 Volume: (cm) 0.13 % Reduction in Area: % Reduction in Volume: Epithelialization: None Tunneling: No Undermining: No Wound Description Classification: Full Thickness Without Exposed Support Structures Wound Margin: Distinct, outline attached Exudate Amount: Medium Exudate Type: Serous Exudate Color: amber Foul Odor After Cleansing: No Slough/Fibrino No Wound Bed Granulation Amount:  Small (1-33%) Exposed Structure Granulation Quality: Pink Fascia Exposed: No Necrotic Amount: Large (67-100%) Fat Layer (Subcutaneous Tissue) Exposed: Yes Necrotic Quality: Adherent Slough Tendon Exposed: No Muscle Exposed: No Joint  Exposed: No Bone Exposed: No [address] [place], [place] ([id number]) [id number] Page 11 of 11 Texture Color No Abnormalities Noted: Yes No Abnormalities Noted: Yes Moisture Temperature / Pain No Abnormalities Noted: Yes Temperature: No Abnormality Tenderness on Palpation: Yes Electronic Signature(s) Signed: 08/09/2022 3:34:35 PM By: Adline Peals Entered By: Adline Peals on 08/09/2022 10:30:30 -------------------------------------------------------------------------------- Vitals Details Patient Name: Date of Service: [name] [name], [name] 08/09/2022 10:00 A M Medical Record Number: [id number] Patient Account Number: [id number] Date of Birth/Sex: Treating RN: 04/09/1971 (52 y.o. F) Primary Care Ciel Yanes: [name] Other Clinician: Referring Rupinder Livingston: Treating Emmette Katt/Extender: Elyse Jarvis Weeks in Treatment: 88 Vital Signs Time Taken: 10:06 Temperature (F): 97.7 Height (in): 67 Pulse (bpm): 89 Weight (lbs): 134 Respiratory Rate (breaths/min): 20 Body Mass Index (BMI): 21 Blood Pressure (mmHg): 138/68 Reference Range: 80 - 120 mg / dl Electronic Signature(s) Signed: 08/09/2022 10:34:37 AM By: Worthy Rancher Entered By: Worthy Rancher on 08/09/2022 10:06:51

## 2022-08-10 NOTE — Progress Notes (Signed)
KENLI, WINNING ([id number]) [id number] Page 1 of 17 Visit Report for 08/09/2022 Chief Complaint Document Details Patient Name: Date of Service: [name] [name] 08/09/2022 10:00 A M Medical Record Number: [id number] Patient Account Number: [id number] Date of Birth/Sex: Treating RN: [birth year] (52 y.o. F) Primary Care Provider: Cammie [name] Other Clinician: Referring Provider: Treating Provider/Extender: [name] Weeks in Treatment: 78 Information Obtained from: Patient Chief Complaint the patient is here for evaluation of her bilateral lower extremity [name] cell ulcers 04/17/2021; patient comes in for substantial wounds on the right and left lower leg Electronic Signature(s) Signed: 08/09/2022 11:32:02 AM By: [name] Maudlin MD FACS Entered By: [name] on 08/09/2022 11:32:02 -------------------------------------------------------------------------------- Debridement Details Patient Name: Date of Service: [name], [name] 08/09/2022 10:00 A M Medical Record Number: [id number] Patient Account Number: [id number] Date of Birth/Sex: Treating RN: Oct 15, 1970 (52 y.o. [name] Primary Care Provider: Cammie [name] Other Clinician: Referring Provider: Treating Provider/Extender: [name] Weeks in Treatment: 68 Debridement Performed for Assessment: Wound #17 Right,Lateral Lower Leg Performed By: Physician [name] Maudlin, MD Debridement Type: Debridement Level of Consciousness (Pre-procedure): Awake and Alert Pre-procedure Verification/Time Out Yes - 10:40 Taken: Start Time: 10:40 Pain Control: Lidocaine 4% Topical Solution T Area Debrided (L x W): otal 5 (cm) x 4 (cm) = 20 (cm) Tissue and other material debrided: Non-Viable, Eschar, Slough, Slough Level: Non-Viable Tissue Debridement Description: Selective/Open Wound Instrument: Curette Bleeding: Minimum Hemostasis Achieved:  Pressure Response to Treatment: Procedure was tolerated well Level of Consciousness (Post- Awake and Alert procedure): Post Debridement Measurements of Total Wound Length: (cm) 6.8 Width: (cm) 5 Depth: (cm) 0.2 Volume: (cm) 5.341 Character of Wound/Ulcer Post Debridement: Improved Post Procedure Diagnosis Same as Pre-procedure Notes scribed for Dr. Celine [name] by [name] Peals, RN Electronic Signature(s) Signed: 08/09/2022 1:35:04 PM By: [name] Maudlin MD FACS Signed: 08/09/2022 3:34:35 PM By: [name] Entered By: [name] on 08/09/2022 10:46:30 [name] ([id number]) [id number] Page 2 of 17 -------------------------------------------------------------------------------- Debridement Details Patient Name: Date of Service: [name] [name] 08/09/2022 10:00 A M Medical Record Number: [id number] Patient Account Number: [id number] Date of Birth/Sex: Treating RN: 1970/07/13 (52 y.o. [name] Primary Care Provider: Cammie [name] Other Clinician: Referring Provider: Treating Provider/Extender: [name] Weeks in Treatment: 68 Debridement Performed for Assessment: Wound #21 Right,Medial Ankle Performed By: Physician [name] Maudlin, MD Debridement Type: Debridement Severity of Tissue Pre Debridement: Fat layer exposed Level of Consciousness (Pre-procedure): Awake and Alert Pre-procedure Verification/Time Out Yes - 10:40 Taken: Start Time: 10:40 Pain Control: Lidocaine 4% Topical Solution T Area Debrided (L x W): otal 4 (cm) x 3 (cm) = 12 (cm) Tissue and other material debrided: Non-Viable, Eschar, Slough, Slough Level: Non-Viable Tissue Debridement Description: Selective/Open Wound Instrument: Curette Bleeding: Minimum Hemostasis Achieved: Pressure Response to Treatment: Procedure was tolerated well Level of Consciousness (Post- Awake and Alert procedure): Post Debridement  Measurements of Total Wound Length: (cm) 6.8 Width: (cm) 3 Depth: (cm) 0.1 Volume: (cm) 1.602 Character of Wound/Ulcer Post Debridement: Improved Severity of Tissue Post Debridement: Fat layer exposed Post Procedure Diagnosis Same as Pre-procedure Notes scribed for Dr. Celine [name] by [name] Peals, RN Electronic Signature(s) Signed: 08/09/2022 1:35:04 PM By: [name] Maudlin MD FACS Signed: 08/09/2022 3:34:35 PM By: [name] Entered By: [name] on 08/09/2022 10:49:07 -------------------------------------------------------------------------------- HPI Details Patient Name: Date of Service: [name], [name] 08/09/2022 10:00 A M Medical Record Number: [id number] Patient Account Number: [id number] Date of Birth/Sex: Treating RN: 06-14-1970 ( 52 y.o. F) Primary Care Provider: Cammie [name] Other Clinician: Referring Provider: Treating Provider/Extender: [name] Weeks in Treatment: 46 History of Present Illness Location: medial and lateral ankle region on the right and left medial malleolus Quality: Patient reports experiencing a shooting pain to affected area(s). Severity: Patient states wound(s) are getting worse. Duration: right lower extremity bimalleolar ulcers have been present for approximately 2 years; the rright meedial malleolus ulcer has been there proximally 6 months Timing: Pain in wound is constant (hurts all the time) Context: The wound would happen gradually ssociated Signs and Symptoms: Patient reports having increase discharge. 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 Leland [name] for a surgical debridement as I was unable to do anything in the office due to excruciating pain. At that stage she was referred from the plastic surgery service to dermatology who treated her for a skin  infection with doxycycline and then Levaquin and a local antibiotic [name], [name] ([id number]) [id number] Page 3 of 17 ointment. I understand the patient has since developed ulceration on the left ankle both medial and lateral and was now referred back to the wound [name] as dermatology has finished the management. 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 She has been visiting the wound [name] and was seen by Dr. Dellia Nims and Dr. Leland [name]. after prolonged conservator therapy between July 2016 and January 2017. She had been seen by the plastic surgeon and taken to the OR for debridement and application of Theraskin. She had 3 applications of Theraskin and was then treated with collagen. Prior to that she had a history of similar problems in 2014 and was treated conservatively. Had a reflux study done for the right lower extremity in August 2016 without reflux or DVT . Past medical history significant for [name] cell disease, anemia, leg ulcers, cholelithiasis,and has never been a smoker. Once the patient was discharged on the wound [name] she says within 2 or 3 weeks the problems recurred and she has been treating it conservatively. since I saw her 3 weeks ago at [facility] she has been unable to get her dressing material but has completed a course of doxycycline. 6/7/ 2017 -- lower extremity venous duplex reflux evaluation was done No evidence of SVT or DVT in the RLL. No venous incompetence in the RLL. No further vascular workup is indicated at this time. She was seen by Dr. [name], on 10/04/2015. She agreed with the plan of taking her to the OR for debridement and application of theraskin and would also take biopsies to rule out  pyoderma gangrenosum. Follow-up note dated May 31 received and she was status post application of Theraskin to multiple ulcers around the right ankle. Pathology did not show evidence of malignancy or pyoderma gangrenosum. She would continue to see as in the wound clinic for further care and see Dr. Leland [name] as needed. The patient brought the biopsy report and it was consistent with stasis ulcer no evidence of malignancy and the comment was that there was some adjacent neovascularization, fibrosis and patchy perivascular chronic inflammation. 11/15/2015 -- today we applied her first application of Theraskin 11/30/15; TheraSkin #2 12/13/2015 --  she is having a lot of pain locally and is here for possible application of a theraskin today. 01/16/2016 -- the patient has significant pain and has noticed despite in spite of all local care and oral pain medication. It is impossible to debride her in the office. 02/06/2016 -- I do not see any notes from Dr. Iran Planas( the patient has not made a call to the office know as she heard from them) and the only visit to recently was with her PCP Dr. Danella Penton -- I saw her on 01/16/2016 and prescribed 90 tablets of oxycodone 10 mg and did lab work and screening for HIV. the HIV was negative and hemoglobin was 6.3 with a WBC count of 14.9 and hematocrit of 17.8 with platelets of 561. reticulocyte count was 15.5% READMISSION: 07/10/2016- The patient is here for readmission for bilateral lower extremity ulcers in the presence of [name] cell. The bimalleolar ulcers to the right lower extremity have been present for approximately 2 years, the left medial malleolus ulcer has been present approximately 6 months. She has followed with Dr.Thimmappa in the past and has had a total of 3 applications of Theraskin (01/2015, 09/2015, 06/17/16). She has also followed with Dr. Con Memos here in the clinic and has received 2 applications of TheraSkin (11/10/15, 11/30/15). The patient  does experience chronic, and is not amenable to debridement. She had a [name] cell crisis in December 2017, prior to that has been several years. She is not currently on any antibiotic therapy and has not been treated with any recently. 07/17/2016 -- was seen by Dr. Iran Planas of plastic surgery who saw her 2 weeks postop application of Theraskin #3. She had removed her dressing and asked her to apply silver alginate on alternate days and follow-up back with the wound [name]. Future debridements and application of skin substitute would have to be done in the hospital due to her high risk for anesthesia. READMISSION 04/17/2021 Patient is now a 52 year old woman that we have had in this clinic for a prolonged period of time and 2016-2017 and then again for 2 visits in February 2018. At that point she had wounds on the right lower leg predominantly medial. She had also been seen by plastic surgery Dr. Leland [name] who I believe took her to the OR for operative debridement and application of TheraSkin in 2017. After she left our clinic she was followed for a very prolonged period of time in the wound care [name] in [facility] who then referred her ultimately to [name] where she was seen by Dr. Vernona Rieger. Again taken her to the OR for skin grafting which apparently did not take. She had multiple other attempts at dressings although I have not really looked over all of these notes in great detail. She has not been seen in a wound care [name] in about a year. She states over the last year in addition to her right lower leg she has developed wounds on the left lower leg quite extensive. She is using Xeroform to all of these wounds without really any improvement. She also has Medicaid which does not cover wound products. The patient has had vascular work-ups in the past including most recently on 03/28/2021 showing biphasic waveforms on the right triphasic at the PTA and biphasic at the dorsalis pedis on the left. She  was unable to tolerate any degree of compression to do ABIs. Unfortunately TBI's were also not done. She had venous reflux studies done in 2017. This did not show any  evidence of a DVT or SVT and no venous incompetence was noted in the right leg at the time this was the only side with the wound As noted I did not look all over her old records. She apparently had a course of HBO and [facility] although I am not sure what the indication would have been. In any case she developed seizures and terminated treatment earlier. She is generally much more disabled than when we last saw her in clinic. She can no longer walk pretty much wheelchair-bound because predominantly of pain in the left hip. 04/24/2021; the patient tolerated the wraps we put on. We used Santyl and Hydrofera Blue under compression. I brought her back for a nurse visit for a change in dressing. With Medicaid we will have a hard time getting anything paid for and hence the need for compression. She arrives in clinic with all the wounds looking somewhat better in terms of surface 12/20; circumferential wound on the right from the lateral to the medial. She has open areas on the left medial and left lateral x2 on all of this with the same surface. This does not look completely healthy although she does have some epithelialization. She is not complaining of a lot of pain which is unusual for her [name] ulcers. I have not looked over her extensive records from [name]. She had recent arterial studies and has a history of venous reflux studies I will need to look these over although I do not believe she has significant arterial disease 2023 05/22/2021; patient's wound areas measure slightly smaller. Still a lot of drainage coming from the right we have been using Hydrofera Blue and Santyl with some improvement in the wound surfaces. She tells me she will be getting transfused later in the week for her underlying [name] cell anemia I have looked over  her recent arterial studies which were done in the fall. This was in November and showed biphasic and triphasic waveforms but she could not tolerate ABIs because of pressure and unfortunately TBI's were not done. She has not had recent venous reflux studies that I can see 1/10; not much change about the same surface area. This has a yellowish surface to it very gritty. We have been using Santyl and Hydrofera Blue for a prolonged period. Culture I did last week showed methicillin sensitive staph aureus "rare". Our intake nurse reports greenish drainage which may be the Hydrofera Blue itself 1/17; wounds are continue to measure smaller although I am not sure about the accuracy here. Especially the areas on the right are covered in what looks to be a nonviable surface although she does have some epithelialization. Similarly she has areas on the left medial and left lateral ankle area which appear to have a better surface and perhaps are slightly smaller. We have been using Santyl and Hydrofera Blue. She cannot tolerate mechanical debridements She went for her reflux studies which showed significant reflux at the greater saphenous vein at the saphenofemoral junction as well as the greater saphenous [facility] ([id number]) [id number] Page 4 of 17 vein in the proximal calf on the left she had reflux in the thigh and the common femoral vein and supra vein Fishel vein reflux in the greater saphenous vein. I will have vein and vascular look at this. My thoughts have been that these are likely [name] wounds. I looked through her old records from [name] and then when she graduated to [name] where  she saw Dr. Zigmund Daniel and Dr. Vernona Rieger. Although I can see she had reflux studies done I do not see that she actually saw a vein and vascular. I went over the fact that she had operative debridements and actual skin grafting that did not take.  I do not think these wounds have ever really progressed towards healing 1/31;Substantial wounds on the right ankle area. Hyper granulated very gritty adherent debris on the surface. 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 On the left she has no DVT or SVT deep vein reflux in the common femoral vein. Superficial vein reflux in the greater saphenous vein on although the vein diameter is not really all that large. I do not think there is anything that can be done with these although I am going to send her for consultation to vein and vascular. 2/7; Wound exam; substantial wound area on the right posterior ankle area and areas on the left medial ankle and left lateral ankle. I was able to debride the left medial ankle last week fairly aggressively and it is back this week to a completely nonviable surface She will see vascular surgery this Friday and I would like them to review the venous studies and also any comments on her arterial status. If they do not see an issue here I am going to refer her to plastic surgery for an operative debridement perhaps intraoperative ACell or Integra. Eventually she will require a deep tissue culture again 2/14; substantial wound area on the right posterior ankle, medial ankle. We have been using silver alginate The patient was seen by vein and vascular she had both venous reflux studies and arterial studies. 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 She had no evidence of superficial venous thrombosis. She did have superficial vein  reflux at the saphenofemoral junction and the greater saphenous vein. On the left no evidence of a DVT no evidence of superficial venous throat thrombosis she did have deep vein reflux in the common femoral vein and superficial vein reflux in the greater saphenous vein but these were not felt to be amenable to ablation. In terms of arterial studies she had triphasic and wife biphasic wave waveforms bilaterally not felt to have a significant arterial issue. I do not get the feeling that they felt that any part of her nonhealing wounds were related to either arterial or venous issues. They did note that she had venous reflux at the right at the [name] and [facility]. And also on the left there were reflux in the deep system at the common femoral vein and greater saphenous vein in the proximal thigh. Nothing amenable to ablation. 2/20; she is making some decent progress on the right where there is nice skin between the 2 open areas on the right ankle. The surfaces here do not look viable yet there is some surrounding epithelialization. She still has a small area on the left medial ankle area. Hyper-granulated [name] 2/28 patient has an appointment with plastic surgery on 3/8. We will see her back  on 3/9. She may have to call us to get the area redressed. We've been using Santyl under silver alginate. We made a nice improvement on the left medial ankle. The larger wounds on the right also looks somewhat better in terms of epithelialization although I think they could benefit from an aggressive debridement if plastic surgery would be willing to do that. Perhaps placement of Integra or a cell 07/26/2021: She saw Dr. Claudia Desanctis yesterday. He raised the question as to whether or not this might be pyoderma and wanted to wait until that question was answered by dermatology before proceeding with any sort of operative debridement. We have continue to use Santyl under silver alginate with Kerlix and Coban  wraps. Overall, her wounds appear to be continuing to contract and epithelialize, with some granulation tissue present. There continues to be some slough on all wound surfaces. 08/09/2021: She has not been able to get an appointment with dermatology because apparently the offices in [place] do not accept Medicaid. She is looking into whether or not she can be seen at the main [facility] dermatology clinic. This is necessary because plastic surgery is concerned that her wounds might represent pyoderma and they did not want to do any procedure until that was clarified. We have been using Santyl under silver alginate with Kerlix and Coban wraps. Today, there was a greater amount of drainage on her dressings with a slight green discoloration and significant odor. Despite this, her wounds continue to contract and epithelialize. There is pale granulation tissue present and actually, on the left medial ankle, the granulation tissue is a bit hypertrophic. 08/16/2021: Last week, I took a culture and this grew back rare methicillin-resistant Staph aureus and rare corynebacterium. The MRSA was sensitive to gentamicin which we began applying topically on an empiric basis. This week, her wounds are a bit smaller and the drainage and odor are less. Her primary care provider is working on assisting the patient with a dermatology evaluation. She has been in silver alginate over the gentamicin that was started last week along with Kerlix and Coban wraps. 08/23/2021: Because she has Medicaid, we have been unable to get her into see any dermatologist in the [place] to rule out pyoderma gangrenosum, which was a requirement from plastic surgery prior to any sort of debridement and grafting. Despite this, however, all of her wounds continue to get smaller. The wound on her left medial ankle is nearly closed. There is no odor from the wounds, although she still accumulates a modest amount of drainage on  her dressings. 08/30/2021: The lateral right ankle wound and the medial left ankle wound are a bit smaller today. The medial right ankle wound is about the same size. They are less tender. We have still been unable to get her into dermatology. 09/06/2021: All of the wounds are about the same size today. She continues to endorse minimal pain. I communicated with Dr. Claudia Desanctis in plastic surgery regarding our issues getting a dermatology appointment; he was out of town but indicated that he would look into perhaps performing the biopsy in his office and will have his office contact her. 09/14/2021: The patient has an appointment in dermatology, but it is not until October. Her wounds are roughly the same; she continues to have very thick purulent-looking drainage on her dressings. 09/20/2021: The left medial wound is nearly closed and just has a bit of accumulated eschar on the surface. The right medial and lateral ankle wounds are  perhaps a little bit smaller. They continue to have a very pale surface with accumulation of thin slough. 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 I did not think [employer] was indicated based on this. She is getting topical mupirocin with Prisma silver collagen. 10/04/2021: The patient was not seen in clinic last week due to childcare coverage issues. In the interim, the left medial leg wound has closed. The right sided leg wounds are smaller. There is more granulation tissue coming through, particularly on the lateral wound. The surface remains somewhat gritty. We have been applying topical mupirocin and Prisma silver collagen. 10/11/2021: The left medial leg wound remains closed. She does complain of some anesthetic sensation to the area. Both of the right-sided leg wounds are smaller but still have accumulated slough. 10/18/2021: Both right-sided leg wounds are minimally smaller this week. She still continues to accumulate slough and has thick drainage on  her dressings. 10/23/2021: Both wounds continue to contract. There is still slough buildup. She has been approved for a keratin-based skin substitute trial product but it will not be available until next week. 10/30/2021: The wounds are about the same to perhaps slightly smaller. There is still continued slough buildup. Unfortunately, the rep for the keratin based product did not show up today and did not answer his phone when called. 11/08/2021: The wounds are little bit smaller today. She continues to have thick drainage but the surfaces are relatively clean with just a little bit of slough accumulation. She reported to me today that she is unable to completely flex her left ankle and on examination it seems this is potentially related to scar tissue from her wounds. We do have the ProgenaMatrix trial product available for her today. [name], [name] ([id number]) [id number] Page 5 of 17 11/15/2021: Both wounds are smaller today. There is some slough accumulation on the surfaces, but the medial wound, in particular looks like it is filling in and is less deep. She did hear from physical therapy and she is going to start working with them on July 11. She is here for her second application of the trial skin substitute, ProgenaMatrix. 11/22/2021: Both wounds continue to contract, the medial more dramatically than the lateral. Both wounds have a layer of slough on the surface, but underneath this, the gritty fibrous tissue has a little bit more of a pink cast to it rather than being as pale as it has been. 11/29/2021: The wounds are roughly the same size this week, perhaps a millimeter or 2 smaller. The medial wound has filled in and is nearly flush with the surrounding skin surface. She continues to have a lot of slough accumulation on both surfaces. 12/06/2021: No significant change to her wounds, but she has a new opening on her dorsal foot, just distal to the right lateral ankle  wound. The area on her left medial ankle that reopened looks a little bit larger today. She has quite a bit of pain associated with the new wound. 12/12/2021: Her wounds look about the same but the new opening on her right lateral dorsal foot is a little bit bigger. She continues to have a fair amount of pain with this wound. 12/26/2021: The left medial ankle wound is tiny and superficial. She has 2 areas of crusting on her left lateral ankle, however, that appear to be threatening to open again. Her right medial ankle wound is a little bit smaller today but still continues to accumulate thick rubbery slough. The  new dorsal foot wound is exquisitely painful but there is no odor or purulent drainage. No erythema or induration. The right lateral ankle wound looks about the same today, again with thick rubbery slough. 01/03/2022: The left medial ankle wound has closed again. Both right ankle wounds appear to be about the same size with thick rubbery slough. The dorsal foot wound on the right continues to be quite painful and she stated that she did not want any debridement of that site today. 01/10/2022: No real change to any of her wounds. She continues to accumulate thick slough. The dorsal foot wound has merged with the lateral malleolar wound. She is experiencing significant pain in the dorsal foot portion of the ulcer. 01/16/2022: Absolutely no change or progress in her wounds. 01/24/2022: Her wounds are unchanged. She continues to build up slough and the wounds on her dorsal right foot are still exquisitely tender. 01/31/2022: The wounds actually measure a little bit narrower today. They still have thick slough on the surface but the underlying tissue seems a little less fibrotic. We changed to Iodosorb last week. 02/07/2022: Wounds continue to slowly epithelialize around the parameters. She has less pain today. She still accumulates a fairly substantial layer of slough. 02/15/2022: No change to the wounds  overall. The measured a little bit larger today per the intake nurse. She continues to have substantial slough accumulation and her pain is a little bit worse. 02/21/2022. No change at all to her wounds. I do not see that plastic surgery has received her referral yet. 02/28/2022: The wounds remain unchanged. They are dry and fibrotic with accumulation of slough and eschar. She did receive an appointment to see plastic surgery on October 23, but the office called her back and indicated they needed to reschedule and that the next available appointment was not until December. The patient became angry and decided she did not want to see this plastics group. 10/19; the patient sees Dr. Lovett Calender again next week. She is using Medihoney as the primary dressing changing this herself. 03/21/2022: She saw Dr. Jaclynn Guarneri at the wound care [name] at [name]. Dr. Jaclynn Guarneri was also unable to debride her, secondary to pain and is planning to take her to the operating room for operative debridement and potential skin substitute placement. Her wounds are unchanged with thick yellow slough and a fibrotic base. She says they are too painful to be debrided. In addition, yesterday her hemoglobin was 4 and she received 2 units packed red blood cell transfusion. 04/04/2022: Her operation at [name] is scheduled for December 15. She continues to use Medihoney on her wounds. They are a little bit less painful today and she is willing to entertain the possibility of debridement. The wounds measured a little bit larger in all dimensions today but the layer of slough is not as thick as usual. 04/18/2022: Her wounds actually measured a little bit smaller today and the extension onto the dorsal foot from the lateral wound has healed. They are less tender and there is significantly less slough present as compared to prior visits. 05/09/2022: She had to reschedule her surgery due to lack of childcare. It is  now planned for January 5. The wounds are basically unchanged, but she had a lot more drainage, which was thick and somewhat purulent in appearance. No significant odor. 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 They are quite painful today and she does not want to have debridement performed. 05/30/2022: The patient underwent  surgical debridement and placement of TheraSkin to her wounds on January 5. She has been doing well and does not endorse any pain. The TheraSkin is intact and looks beautiful. It is sutured in place. There is no exudate or significant drainage. 06/04/2022: Her TheraSkin remains intact and I am starting to see granulation tissue emerging underneath the surface. No concern for infection. 06/12/2022: The TheraSkin is intact and there is more granulation tissue emerging. It is starting to look a little bit dry, however. No malodor or purulent drainage. 06/19/2022: The TheraSkin continues to look good. The edges are a bit dry, but the central portion of each of her wounds is showing a nice pink color. 06/27/2022: The TheraSkin on the lateral wound remains almost completely intact. There is nice pink tissue peaking through the mesh of the TheraSkin. On the medial leg, it is starting to breakdown a little bit, but most of the TheraSkin remains intact here, as well. Both wounds measured smaller today. 07/03/2022: The wounds are fairly dry around the perimeter today. Some of the suture is hanging loose. 07/12/2022: Both wounds are measuring smaller today. The medial ulcer is getting a bit too dry. The lateral ulcer continues to have nice buds of granulation tissue emerging. 07/26/2022: The moisture balance on both wounds is much better today. For the first time since I began seeing her, I see actual beefy red granulation tissue on the lateral ankle wound. There are more buds of deep pink granulation tissue emerging on the medial ankle. There is some dry eschar around the edges of  the medial wound and a tiny amount of slough overlying the good granulation tissue on the lateral wound. 08/09/2022: Unfortunately, she has a new wound on her left lateral lower leg, just above the ankle. It is extremely painful for her. It penetrates to the fat layer and has the same woody, fibrotic character as her previous wounds. The other wounds are both a little bit smaller with slough and some eschar around the periphery. Electronic Signature(s) Signed: 08/09/2022 11:33:35 AM By: [name] Maudlin MD FACS [name], [name] ([id number]) AM By: [name] Maudlin MD FACS [id number] Page 6 of 17 Signed: 08/09/2022 11:33:35 Entered By: [name] on 08/09/2022 11:33:35 -------------------------------------------------------------------------------- Physical Exam Details Patient Name: Date of Service: [name] [name] 08/09/2022 10:00 A M Medical Record Number: [id number] Patient Account Number: [id number] Date of Birth/Sex: Treating RN: 10/08/1970 (52 y.o. F) Primary Care Provider: Cammie [name] Other Clinician: Referring Provider: Treating Provider/Extender: [name] Weeks in Treatment: 53 Constitutional . . . . no acute distress. Respiratory Normal work of breathing on room air. Notes 08/09/2022: Unfortunately, she has a new wound on her left lateral lower leg, just above the ankle. It is extremely painful for her. It penetrates to the fat layer and has the same woody, fibrotic character as her previous wounds. The other wounds are both a little bit smaller with slough and some eschar around the periphery. Electronic Signature(s) Signed: 08/09/2022 11:34:26 AM By: [name] Maudlin MD FACS Entered By: [name] on 08/09/2022 11:34:26 -------------------------------------------------------------------------------- Physician Orders Details Patient Name: Date of Service: [name], [name] 08/09/2022 10:00 A M Medical Record  Number: [id number] Patient Account Number: [id number] Date of Birth/Sex: Treating RN: August 10, 1970 (52 y.o. 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 - Dr. Celine [name] - room 2 Return A Anesthetic (In clinic) Topical Lidocaine 4% applied to wound bed Cellular or Tissue Based Products Other Cellular or Tissue Based Products Orders/Instructions: - Theraskin applied by plastic surgeon, leave on until further notice Bathing/ Shower/ Hygiene Do not shower or bathe in tub. Edema Control - Lymphedema / SCD / Other Avoid standing for long periods of time. Exercise regularly Additional Orders / Instructions Follow Nutritious Diet Juven Shake 1-2 times daily. Wound Treatment Wound #17 - Lower Leg Wound Laterality: Right, Lateral Cleanser: Soap and Water 2 x Per Day/30 Days ARIELLA, LAUBENTHAL ([id number]) [id number] Page 7 of 17 Discharge Instructions: May shower and wash wound with dial antibacterial soap and water prior to dressing change. Cleanser: Wound Cleanser 2 x Per Day/30 Days Discharge Instructions: Cleanse the wound with wound cleanser prior to applying a clean dressing using gauze sponges, not tissue or cotton balls. Peri-Wound Care: Triamcinolone 15 (g) 2 x Per Day/30 Days Discharge Instructions: Use triamcinolone 15 (g) as directed Peri-Wound Care: Sween Lotion (Moisturizing lotion) 2 x Per Day/30 Days Discharge Instructions: Apply moisturizing lotion as directed Topical:  Skintegrity Hydrogel 4 (oz) 2 x Per Day/30 Days Discharge Instructions: Apply hydrogel as directed Prim Dressing: Promogran Prisma Matrix, 4.34 (sq in) (silver collagen) 2 x Per Day/30 Days ary Discharge Instructions: Moisten collagen with saline or hydrogel Secondary Dressing: ABD Pad, 5x9 2 x Per Day/30 Days Discharge Instructions: Apply over primary dressing as directed. Compression Wrap: Urgo K2 Lite, two layer compression system, regular 2 x Per Day/30 Days Discharge Instructions: Apply Urgo K2 Lite as directed (alternative to 3 layer compression). Wound #21 - Ankle Wound Laterality: Right, Medial Cleanser: Soap and Water 2 x Per Day/30 Days Discharge Instructions: May shower and wash wound with dial antibacterial soap and water prior to dressing change. Cleanser: Wound Cleanser 2 x Per Day/30 Days Discharge Instructions: Cleanse the wound with wound cleanser prior to applying a clean dressing using gauze sponges, not tissue or cotton balls. Peri-Wound Care: Triamcinolone 15 (g) 2 x Per Day/30 Days Discharge Instructions: Use triamcinolone 15 (g) as directed Peri-Wound Care: Sween Lotion (Moisturizing lotion) 2 x Per Day/30 Days Discharge Instructions: Apply moisturizing lotion as directed Topical: Skintegrity Hydrogel 4 (oz) 2 x Per Day/30 Days Discharge Instructions: Apply hydrogel as directed Prim Dressing: Promogran Prisma Matrix, 4.34 (sq in) (silver collagen) 2 x Per Day/30 Days ary Discharge Instructions: Moisten collagen with saline or hydrogel Secondary Dressing: ABD Pad, 5x9 2 x Per Day/30 Days Discharge Instructions: Apply over primary dressing as directed. Compression Wrap: Urgo K2 Lite, two layer compression system, regular 2 x Per Day/30 Days Discharge Instructions: Apply Urgo K2 Lite as directed (alternative to 3 layer compression). Wound #24 - Ankle Wound Laterality: Left, Lateral Cleanser: Soap and Water 2 x Per Day/30 Days Discharge Instructions: May shower and  wash wound with dial antibacterial soap and water prior to dressing change. Cleanser: Wound Cleanser 2 x Per Day/30 Days Discharge Instructions: Cleanse the wound with wound cleanser prior to applying a clean dressing using gauze sponges, not tissue or cotton balls. Peri-Wound Care: Triamcinolone 15 (g) 2 x Per Day/30 Days Discharge Instructions: Use triamcinolone 15 (g) as directed Peri-Wound Care: Sween Lotion (Moisturizing lotion) 2 x Per Day/30 Days Discharge Instructions: Apply moisturizing lotion as directed Prim  Dressing: IODOFLEX 0.9% Cadexomer Iodine Pad 4x6 cm 2 x Per Day/30 Days ary Discharge Instructions: Apply to wound bed as instructed Secondary Dressing: ABD Pad, 5x9 2 x Per Day/30 Days Discharge Instructions: Apply over primary dressing as directed. Compression Wrap: Urgo K2 Lite, two layer compression system, regular 2 x Per Day/30 Days Discharge Instructions: Apply Urgo K2 Lite as directed (alternative to 3 layer compression). Patient Medications llergies: No Known Allergies A Notifications Medication Indication [address] [name], [name] ([id number]) [id number] Page 8 of 17 08/09/2022 lidocaine DOSE topical 4 % cream - cream topical Electronic Signature(s) Signed: 08/09/2022 1:35:04 PM By: [name] Maudlin MD FACS Entered By: [name] on 08/09/2022 11:34:47 -------------------------------------------------------------------------------- Problem List Details Patient Name: Date of Service: [name], [name] 08/09/2022 10:00 A M Medical Record Number: [id number] Patient Account Number: [id number] Date of Birth/Sex: Treating RN: [birth year] (52 y.o. 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 04/17/2021 No Yes Inactive Problems ICD-10 Code Description Active Date Inactive Date L97.828 Non-pressure chronic ulcer of other part of left lower leg with other specified severity 04/17/2021 04/17/2021 Resolved Problems Electronic Signature(s) Signed: 08/09/2022 11:21:46 AM By: [name] Maudlin MD FACS Entered By: [name] on 08/09/2022 11:21:46 -------------------------------------------------------------------------------- Progress Note Details Patient Name: Date of Service: [name], [name] 08/09/2022 10:00 A M Medical Record Number: [id number] Patient Account Number: [id number] Date of Birth/Sex: Treating RN: Dec 27, 1970 (52 y.o. F) Primary Care Provider: Cammie [name] Other Clinician: Referring Provider: Treating Provider/Extender: [name] Weeks in Treatment: 43 Subjective Chief Complaint Information obtained from Patient the patient is here for evaluation of her bilateral lower extremity [name] cell ulcers 04/17/2021; patient comes in for substantial wounds on the right and left lower leg ROSELMA, RONNING ([id number]) [id number] Page 9 of 17 History of Present Illness (HPI) The following HPI elements were documented for the patient's wound: Location: medial and lateral ankle region on the right and left medial malleolus Quality: Patient reports experiencing a shooting pain to affected area(s). Severity: Patient states wound(s) are getting worse. Duration: right lower extremity bimalleolar ulcers have been present for approximately 2 years; the rright meedial malleolus ulcer has been there proximally 6 months Timing: Pain in wound is constant (hurts all the time) Context: The wound would happen  gradually Associated Signs and Symptoms: Patient reports having increase discharge. 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 Leland [name] for a surgical debridement as I was unable to do anything in the office due to excruciating pain. At that stage she was referred from the plastic surgery service to dermatology who treated her for a skin infection with doxycycline and then Levaquin and a local antibiotic ointment. I understand the patient has since developed ulceration on the left ankle both medial and lateral and was now referred back to the wound [name] as dermatology has finished the management. 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 She has been visiting the wound [name] [facility] and was seen by Dr. Dellia Nims and Dr. Leland [name]. after prolonged conservator therapy between July 2016 and January 2017. She had been seen by the plastic surgeon and taken to the OR for debridement and application of Theraskin. She had 3 applications of Theraskin and was then treated with collagen. Prior to that she had a history of similar problems in 2014 and was treated conservatively. Had a reflux study done for the right lower extremity in August 2016 without reflux or DVT . Past medical history significant for [name] cell disease, anemia, leg ulcers, cholelithiasis,and has never been a smoker. Once the patient was discharged on the wound [name] she says within 2 or 3 weeks the problems recurred and she has been treating it conservatively. since I saw her 3 weeks ago at [facility] she has been unable to get her dressing material but has completed a course of doxycycline. 6/7/  2017 -- lower extremity venous duplex reflux evaluation was done oo No evidence of SVT or DVT in the RLL. No venous incompetence in the RLL. No further vascular workup is indicated at this time. She was seen by Dr. [name], on 10/04/2015. She agreed with the plan of taking her to the OR for debridement and application of theraskin and would also take biopsies to rule out pyoderma gangrenosum. Follow-up note dated May 31 received and she was status post application of Theraskin to multiple ulcers around the right ankle. Pathology did not show evidence of malignancy or pyoderma gangrenosum. She would continue to see as in the wound clinic for further care and see Dr. Leland [name] as needed. The patient brought the biopsy report and it was consistent with stasis ulcer no evidence of malignancy and the comment was that there was some adjacent neovascularization, fibrosis and patchy perivascular chronic inflammation. 11/15/2015 -- today we applied her first application of Theraskin 11/30/15; TheraSkin #2 12/13/2015 -- she is having a lot of pain locally and is here for possible application of a theraskin today. 01/16/2016 -- the patient has significant pain and has noticed despite in spite of all local care and oral pain medication. It is impossible to debride her in the office. 02/06/2016 -- I do not see any notes from Dr. Iran Planas( the patient has not made a call to the office know as she heard from them) and the only visit to recently was with her PCP Dr. Danella Penton -- I saw her on 01/16/2016 and prescribed 90 tablets of oxycodone 10 mg and did lab work and screening for HIV. the HIV was negative and hemoglobin was 6.3 with a WBC count of 14.9 and hematocrit of 17.8 with platelets of 561. reticulocyte count was 15.5% READMISSION: 07/10/2016- The patient is here for readmission for bilateral lower extremity ulcers in the presence of [name] cell. The bimalleolar ulcers to the right  lower extremity have been present for approximately 2 years, the left medial malleolus ulcer has been present approximately 6 months. She has followed with Dr.Thimmappa in the past and has had a total of 3 applications of Theraskin (01/2015, 09/2015, 06/17/16). She has also followed with Dr. Con Memos here in the clinic and has received 2 applications of TheraSkin (11/10/15, 11/30/15). The patient does experience chronic, and is not amenable to debridement. She had a [name] cell crisis in December 2017, prior to that has been several years. She is not  currently on any antibiotic therapy and has not been treated with any recently. 07/17/2016 -- was seen by Dr. Iran Planas of plastic surgery who saw her 2 weeks postop application of Theraskin #3. She had removed her dressing and asked her to apply silver alginate on alternate days and follow-up back with the wound [name]. Future debridements and application of skin substitute would have to be done in the hospital due to her high risk for anesthesia. READMISSION 04/17/2021 Patient is now a 52 year old woman that we have had in this clinic for a prolonged period of time and 2016-2017 and then again for 2 visits in February 2018. At that point she had wounds on the right lower leg predominantly medial. She had also been seen by plastic surgery Dr. Leland [name] who I believe took her to the OR for operative debridement and application of TheraSkin in 2017. After she left our clinic she was followed for a very prolonged period of time in the wound care [name] in [facility] who then referred her ultimately to [facility] where she was seen by Dr. Vernona Rieger. Again taken her to the OR for skin grafting which apparently did not take. She had multiple other attempts at dressings although I have not really looked over all of these notes in great detail. She has not been seen in a wound care [name] in about a year. She states over the last year in addition to her right lower leg she has  developed wounds on the left lower leg quite extensive. She is using Xeroform to all of these wounds without really any improvement. She also has Medicaid which does not cover wound products. The patient has had vascular work-ups in the past including most recently on 03/28/2021 showing biphasic waveforms on the right triphasic at the PTA and biphasic at the dorsalis pedis on the left. She was unable to tolerate any degree of compression to do ABIs. Unfortunately TBI's were also not done. She had venous reflux studies done in 2017. This did not show any evidence of a DVT or SVT and no venous incompetence was noted in the right leg at the time this was the only side with the wound As noted I did not look all over her old records. She apparently had a course of HBO and [facility] although I am not sure what the indication would have been. In any case she developed seizures and terminated treatment earlier. She is generally much more disabled than when we last saw her in clinic. She can no longer walk pretty much wheelchair-bound because predominantly of pain in the left hip. 04/24/2021; the patient tolerated the wraps we put on. We used Santyl and Hydrofera Blue under compression. I brought her back for a nurse visit for a change in dressing. With Medicaid we will have a hard time getting anything paid for and hence the need for compression. She arrives in clinic with all the wounds looking somewhat better in terms of surface 12/20; circumferential wound on the right from the lateral to the medial. She has open areas on the left medial and left lateral x2 on all of this with the same surface. This does not look completely healthy although she does have some epithelialization. She is not complaining of a lot of pain which is unusual for her [name] ulcers. I have not looked over her extensive records from [facility]. She had recent arterial studies and has a history of venous reflux studies I will need to look  these over although  I do not believe she has significant arterial disease 2023 [name], [name] ([id number]) [id number] Page 10 of 17 05/22/2021; patient's wound areas measure slightly smaller. Still a lot of drainage coming from the right we have been using Hydrofera Blue and Santyl with some improvement in the wound surfaces. 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 This was in November and showed biphasic and triphasic waveforms but she could not tolerate ABIs because of pressure and unfortunately TBI's were not done. She has not had recent venous reflux studies that I can see 1/10; not much change about the same surface area. This has a yellowish surface to it very gritty. We have been using Santyl and Hydrofera Blue for a prolonged period. Culture I did last week showed methicillin sensitive staph aureus "rare". Our intake nurse reports greenish drainage which may be the Hydrofera Blue itself 1/17; wounds are continue to measure smaller although I am not sure about the accuracy here. Especially the areas on the right are covered in what looks to be a nonviable surface although she does have some epithelialization. Similarly she has areas on the left medial and left lateral ankle area which appear to have a better surface and perhaps are slightly smaller. We have been using Santyl and Hydrofera Blue. She cannot tolerate mechanical debridements She went for her reflux studies which showed significant reflux at the greater saphenous vein at the saphenofemoral junction as well as the greater saphenous vein in the proximal calf on the left she had reflux in the thigh and the common femoral vein and supra vein Fishel vein reflux in the greater saphenous vein. I will have vein and vascular look at this. My thoughts have been that these are  likely [name] wounds. I looked through her old records from [name] and then when she graduated to [name] where she saw Dr. Zigmund Daniel and Dr. Vernona Rieger. Although I can see she had reflux studies done I do not see that she actually saw a vein and vascular. I went over the fact that she had operative debridements and actual skin grafting that did not take. I do not think these wounds have ever really progressed towards healing 1/31;Substantial wounds on the right ankle area. Hyper granulated very gritty adherent debris on the surface. 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 On the left she has no DVT or SVT deep vein reflux in the common femoral vein. Superficial vein reflux in the greater saphenous vein on although the vein diameter is not really all that large. I do not think there is anything that can be done with these although I am going to send her for consultation to vein and vascular. 2/7; Wound exam; substantial wound area on the right posterior ankle area and areas on the left medial ankle and left lateral ankle. I was able to debride the left medial ankle last week fairly aggressively and it is back this week to a completely  nonviable surface She will see vascular surgery this Friday and I would like them to review the venous studies and also any comments on her arterial status. If they do not see an issue here I am going to refer her to plastic surgery for an operative debridement perhaps intraoperative ACell or Integra. Eventually she will require a deep tissue culture again 2/14;  substantial wound area on the right posterior ankle, medial ankle. We have been using silver alginate The patient was seen by vein and vascular she had both venous reflux studies and arterial studies. 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 She had no evidence of superficial venous thrombosis. She did have superficial vein reflux at the saphenofemoral junction and the greater saphenous vein. On the left no evidence of a DVT no evidence of superficial venous throat thrombosis she did have deep vein reflux in the common femoral vein and superficial vein reflux in the greater saphenous vein but these were not felt to be amenable to ablation. In terms of arterial studies she had triphasic and wife biphasic wave waveforms bilaterally not felt to have a significant arterial issue. I do not get the feeling that they felt that any part of her nonhealing wounds were related to either arterial or venous issues. They did note that she had venous reflux at the right at the [facility] and [facility]. And also on the left there were reflux in the deep system at the common femoral vein and greater saphenous vein in the proximal thigh. Nothing amenable to ablation. 2/20; she is making some decent progress on the right where there is nice skin between the 2 open areas on the right ankle. The surfaces here do not look viable yet there is some surrounding epithelialization. She still has a small area on the left medial ankle area. Hyper-granulated [name] 2/28 patient has an appointment with plastic surgery on 3/8. We will see her back on 3/9. She may have to call us to get the area redressed. We've been using Santyl under silver alginate. We made a nice improvement on the left medial ankle. The larger wounds on the right also looks somewhat better in terms of epithelialization although I think they could benefit from an aggressive debridement if  plastic surgery would be willing to do that. Perhaps placement of Integra or a cell 07/26/2021: She saw Dr. Claudia Desanctis yesterday. He raised the question as to whether or not this might be pyoderma and wanted to wait until that question was answered by dermatology before proceeding with any sort of operative debridement. We have continue to use Santyl under silver alginate with Kerlix and Coban wraps. Overall, her wounds appear to be continuing to contract and epithelialize, with some granulation tissue present. There continues to be some slough on all wound surfaces. 08/09/2021: She has not been able to get an appointment with dermatology because apparently the offices in [place] do not accept Medicaid. She is looking into whether or not she can be seen at the main [facility] dermatology clinic. This is necessary because plastic surgery is concerned that her wounds might represent pyoderma and they did not want to do any procedure until that was clarified. We have been using Santyl under silver alginate with Kerlix and Coban wraps. Today, there was a greater amount of drainage on her dressings with a slight green discoloration and significant odor. Despite  this, her wounds continue to contract and epithelialize. There is pale granulation tissue present and actually, on the left medial ankle, the granulation tissue is a bit hypertrophic. 08/16/2021: Last week, I took a culture and this grew back rare methicillin-resistant Staph aureus and rare corynebacterium. The MRSA was sensitive to gentamicin which we began applying topically on an empiric basis. This week, her wounds are a bit smaller and the drainage and odor are less. Her primary care provider is working on assisting the patient with a dermatology evaluation. She has been in silver alginate over the gentamicin that was started last week along with Kerlix and Coban wraps. 08/23/2021: Because she has Medicaid, we have been unable  to get her into see any dermatologist in the [place] to rule out pyoderma gangrenosum, which was a requirement from plastic surgery prior to any sort of debridement and grafting. Despite this, however, all of her wounds continue to get smaller. The wound on her left medial ankle is nearly closed. There is no odor from the wounds, although she still accumulates a modest amount of drainage on her dressings. 08/30/2021: The lateral right ankle wound and the medial left ankle wound are a bit smaller today. The medial right ankle wound is about the same size. They are less tender. We have still been unable to get her into dermatology. 09/06/2021: All of the wounds are about the same size today. She continues to endorse minimal pain. I communicated with Dr. Claudia Desanctis in plastic surgery regarding our issues getting a dermatology appointment; he was out of town but indicated that he would look into perhaps performing the biopsy in his office and will have his office contact her. 09/14/2021: The patient has an appointment in dermatology, but it is not until October. Her wounds are roughly the same; she continues to have very thick purulent-looking drainage on her dressings. 09/20/2021: The left medial wound is nearly closed and just has a bit of accumulated eschar on the surface. The right medial and lateral ankle wounds are perhaps a little bit smaller. They continue to have a very pale surface with accumulation of thin slough. 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 I did not think [employer] was indicated based on this. She is getting topical mupirocin with Prisma silver collagen. 10/04/2021: The patient was not seen in clinic last week due to childcare coverage issues. In the interim, the left medial leg wound has closed. The right sided leg wounds are smaller. There is more granulation tissue coming through, particularly on the lateral wound. The surface remains somewhat gritty. We have  been [place], [name] ([id number]) [id number] Page 11 of 17 applying topical mupirocin and Prisma silver collagen. 10/11/2021: The left medial leg wound remains closed. She does complain of some anesthetic sensation to the area. Both of the right-sided leg wounds are smaller but still have accumulated slough. 10/18/2021: Both right-sided leg wounds are minimally smaller this week. She still continues to accumulate slough and has thick drainage on her dressings. 10/23/2021: Both wounds continue to contract. There is still slough buildup. She has been approved for a keratin-based skin substitute trial product but it will not be available until next week. 10/30/2021: The wounds are about the same to perhaps slightly smaller. There is still continued slough buildup. Unfortunately, the rep for the keratin based product did not show up today and did not answer his phone when called. 11/08/2021: The wounds are little bit smaller today. She continues to have thick  drainage but the surfaces are relatively clean with just a little bit of slough accumulation. She reported to me today that she is unable to completely flex her left ankle and on examination it seems this is potentially related to scar tissue from her wounds. We do have the ProgenaMatrix trial product available for her today. 11/15/2021: Both wounds are smaller today. There is some slough accumulation on the surfaces, but the medial wound, in particular looks like it is filling in and is less deep. She did hear from physical therapy and she is going to start working with them on July 11. She is here for her second application of the trial skin substitute, ProgenaMatrix. 11/22/2021: Both wounds continue to contract, the medial more dramatically than the lateral. Both wounds have a layer of slough on the surface, but underneath this, the gritty fibrous tissue has a little bit more of a pink cast to it rather than being as pale as it  has been. 11/29/2021: The wounds are roughly the same size this week, perhaps a millimeter or 2 smaller. The medial wound has filled in and is nearly flush with the surrounding skin surface. She continues to have a lot of slough accumulation on both surfaces. 12/06/2021: No significant change to her wounds, but she has a new opening on her dorsal foot, just distal to the right lateral ankle wound. The area on her left medial ankle that reopened looks a little bit larger today. She has quite a bit of pain associated with the new wound. 12/12/2021: Her wounds look about the same but the new opening on her right lateral dorsal foot is a little bit bigger. She continues to have a fair amount of pain with this wound. 12/26/2021: The left medial ankle wound is tiny and superficial. She has 2 areas of crusting on her left lateral ankle, however, that appear to be threatening to open again. Her right medial ankle wound is a little bit smaller today but still continues to accumulate thick rubbery slough. The new dorsal foot wound is exquisitely painful but there is no odor or purulent drainage. No erythema or induration. The right lateral ankle wound looks about the same today, again with thick rubbery slough. 01/03/2022: The left medial ankle wound has closed again. Both right ankle wounds appear to be about the same size with thick rubbery slough. The dorsal foot wound on the right continues to be quite painful and she stated that she did not want any debridement of that site today. 01/10/2022: No real change to any of her wounds. She continues to accumulate thick slough. The dorsal foot wound has merged with the lateral malleolar wound. She is experiencing significant pain in the dorsal foot portion of the ulcer. 01/16/2022: Absolutely no change or progress in her wounds. 01/24/2022: Her wounds are unchanged. She continues to build up slough and the wounds on her dorsal right foot are still exquisitely  tender. 01/31/2022: The wounds actually measure a little bit narrower today. They still have thick slough on the surface but the underlying tissue seems a little less fibrotic. We changed to Iodosorb last week. 02/07/2022: Wounds continue to slowly epithelialize around the parameters. She has less pain today. She still accumulates a fairly substantial layer of slough. 02/15/2022: No change to the wounds overall. The measured a little bit larger today per the intake nurse. She continues to have substantial slough accumulation and her pain is a little bit worse. 02/21/2022. No change at all to her wounds.  I do not see that plastic surgery has received her referral yet. 02/28/2022: The wounds remain unchanged. They are dry and fibrotic with accumulation of slough and eschar. She did receive an appointment to see plastic surgery on October 23, but the office called her back and indicated they needed to reschedule and that the next available appointment was not until December. The patient became angry and decided she did not want to see this plastics group. 10/19; the patient sees Dr. Lovett Calender again next week. She is using Medihoney as the primary dressing changing this herself. 03/21/2022: She saw Dr. Jaclynn Guarneri at the wound care [name] at [facility]. Dr. Jaclynn Guarneri was also unable to debride her, secondary to pain and is planning to take her to the operating room for operative debridement and potential skin substitute placement. Her wounds are unchanged with thick yellow slough and a fibrotic base. She says they are too painful to be debrided. In addition, yesterday her hemoglobin was 4 and she received 2 units packed red blood cell transfusion. 04/04/2022: Her operation at [facility] is scheduled for December 15. She continues to use Medihoney on her wounds. They are a little bit less painful today and she is willing to entertain the possibility of debridement. The wounds measured a little  bit larger in all dimensions today but the layer of slough is not as thick as usual. 04/18/2022: Her wounds actually measured a little bit smaller today and the extension onto the dorsal foot from the lateral wound has healed. They are less tender and there is significantly less slough present as compared to prior visits. 05/09/2022: She had to reschedule her surgery due to lack of childcare. It is now planned for January 5. The wounds are basically unchanged, but she had a lot more drainage, which was thick and somewhat purulent in appearance. No significant odor. 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 They are quite painful today and she does not want to have debridement performed. 05/30/2022: The patient underwent surgical debridement and placement of TheraSkin to her wounds on January 5. She has been doing well and does not endorse any pain. The TheraSkin is intact and looks beautiful. It is sutured in place. There is no exudate or significant drainage. 06/04/2022: Her TheraSkin remains intact and I am starting to see granulation tissue emerging underneath the surface. No concern for infection. 06/12/2022: The TheraSkin is intact and there is more granulation tissue emerging. It is starting to look a little bit dry, however. No malodor or purulent drainage. 06/19/2022: The TheraSkin continues to look good. The edges are a bit dry, but the central portion of each of her wounds is showing a nice pink color. 06/27/2022: The TheraSkin on the lateral wound remains almost completely intact. There is nice pink tissue peaking through the mesh of the TheraSkin. On the medial leg, it is starting to breakdown a little bit, but most of the TheraSkin remains intact here, as well. Both wounds measured smaller today. [name], [name] ([id number]) [id number] Page 12 of 17 07/03/2022: The wounds are fairly dry around the perimeter today. Some of the suture is hanging  loose. 07/12/2022: Both wounds are measuring smaller today. The medial ulcer is getting a bit too dry. The lateral ulcer continues to have nice buds of granulation tissue emerging. 07/26/2022: The moisture balance on both wounds is much better today. For the first time since I began seeing her, I see actual beefy red granulation tissue on the lateral ankle  wound. There are more buds of deep pink granulation tissue emerging on the medial ankle. There is some dry eschar around the edges of the medial wound and a tiny amount of slough overlying the good granulation tissue on the lateral wound. 08/09/2022: Unfortunately, she has a new wound on her left lateral lower leg, just above the ankle. It is extremely painful for her. It penetrates to the fat layer and has the same woody, fibrotic character as her previous wounds. The other wounds are both a little bit smaller with slough and some eschar around the periphery. Patient History Information obtained from Patient. Family History Diabetes - Mother, Lung Disease - Mother, No family history of Cancer, Heart Disease, Hereditary Spherocytosis, Hypertension, Kidney Disease, Seizures, Stroke, Thyroid Problems, Tuberculosis. Social History Never smoker, Marital Status - Married, Alcohol Use - Never, Drug Use - No History, Caffeine Use - Daily. Medical History Eyes Denies history of Cataracts, Glaucoma, Optic Neuritis Ear/Nose/Mouth/Throat Denies history of Chronic sinus problems/congestion, Middle ear problems Hematologic/Lymphatic Patient has history of Anemia, [name] Cell Disease Denies history of Hemophilia, Human Immunodeficiency Virus, Lymphedema Respiratory Denies history of Aspiration, Asthma, Chronic Obstructive Pulmonary Disease (COPD), Pneumothorax, Sleep Apnea, Tuberculosis Cardiovascular Denies history of Angina, Arrhythmia, Congestive Heart Failure, Coronary Artery Disease, Deep Vein Thrombosis, Hypertension, Hypotension,  Myocardial Infarction, Peripheral Arterial Disease, Peripheral Venous Disease, Phlebitis, Vasculitis Gastrointestinal Denies history of Cirrhosis , Colitis, Crohnoos, Hepatitis A, Hepatitis B, Hepatitis C Endocrine Denies history of Type I Diabetes, Type II Diabetes Genitourinary Denies history of End Stage Renal Disease Immunological Denies history of Lupus Erythematosus, Raynaudoos, Scleroderma Integumentary (Skin) Denies history of History of Burn Musculoskeletal Denies history of Gout, Rheumatoid Arthritis, Osteoarthritis, Osteomyelitis Neurologic Patient has history of Neuropathy - right foot intermittant Denies history of Dementia, Quadriplegia, Paraplegia, Seizure Disorder Oncologic Denies history of Received Chemotherapy, Received Radiation Psychiatric Denies history of Anorexia/bulimia, Confinement Anxiety Hospitalization/Surgery History - c section x2. - left breast lumpectomy. - iandD right ankle with theraskin. Medical A Surgical History Notes nd Constitutional Symptoms (General Health) H/O miscarriage Cardiovascular bradycardia Gastrointestinal cholilithiasis Objective Constitutional no acute distress. Vitals Time Taken: 10:06 AM, Height: 67 in, Weight: 134 lbs, BMI: 21, Temperature: 97.7 F, Pulse: 89 bpm, Respiratory Rate: 20 breaths/min, Blood Pressure: 138/68 mmHg. Respiratory Normal work of breathing on room air. General Notes: 08/09/2022: Unfortunately, she has a new wound on her left lateral lower leg, just above the ankle. It is extremely painful for her. It penetrates to [place], [name] ([id number]) [id number] Page 13 of 17 the fat layer and has the same woody, fibrotic character as her previous wounds. The other wounds are both a little bit smaller with slough and some eschar around the periphery. Integumentary (Hair, Skin) Wound #17 status is Open. Original cause of wound was Gradually Appeared. The date acquired was:  10/05/2012. The wound has been in treatment 68 weeks. The wound is located on the Right,Lateral Lower Leg. The wound measures 6.8cm length x 5cm width x 0.2cm depth; 26.704cm^2 area and 5.341cm^3 volume. There is Fat Layer (Subcutaneous Tissue) exposed. There is no tunneling or undermining noted. There is a medium amount of serosanguineous drainage noted. The wound margin is distinct with the outline attached to the wound base. There is medium (34-66%) pink granulation within the wound bed. There is a medium (34-66%) amount of necrotic tissue within the wound bed including Adherent Slough. The periwound skin appearance had no abnormalities noted for moisture. The periwound skin appearance exhibited: Scarring. The periwound skin appearance did not exhibit:  Ecchymosis. Periwound temperature was noted as No Abnormality. The periwound has tenderness on palpation. Wound #21 status is Open. Original cause of wound was Gradually Appeared. The date acquired was: 06/26/2021. The wound has been in treatment 58 weeks. The wound is located on the Right,Medial Ankle. The wound measures 6.8cm length x 3cm width x 0.1cm depth; 16.022cm^2 area and 1.602cm^3 volume. There is Fat Layer (Subcutaneous Tissue) exposed. There is no tunneling or undermining noted. There is a medium amount of serosanguineous drainage noted. The wound margin is distinct with the outline attached to the wound base. There is medium (34-66%) pink granulation within the wound bed. There is a medium (34- 66%) amount of necrotic tissue within the wound bed including Adherent Slough. The periwound skin appearance had no abnormalities noted for moisture. The periwound skin appearance had no abnormalities noted for color. The periwound skin appearance exhibited: Scarring. Periwound temperature was noted as No Abnormality. The periwound has tenderness on palpation. Wound #24 status is Open. Original cause of wound was Not Known. The date acquired was:  08/06/2022. The wound is located on the Left,Lateral Ankle. The wound measures 1.5cm length x 1.1cm width x 0.1cm depth; 1.296cm^2 area and 0.13cm^3 volume. There is Fat Layer (Subcutaneous Tissue) exposed. There is no tunneling or undermining noted. There is a medium amount of serous drainage noted. The wound margin is distinct with the outline attached to the wound base. There is small (1-33%) pink granulation within the wound bed. There is a large (67-100%) amount of necrotic tissue within the wound bed including Adherent Slough. The periwound skin appearance had no abnormalities noted for texture. The periwound skin appearance had no abnormalities noted for moisture. The periwound skin appearance had no abnormalities noted for color. Periwound temperature was noted as No Abnormality. The periwound has tenderness on palpation. 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 There was a Selective/Open Wound Non-Viable Tissue Debridement with a total area of 20 sq cm performed by [name] Maudlin, MD. With the following instrument(s): Curette to remove Non-Viable tissue/material. Material removed includes Eschar and Slough and after achieving pain control using Lidocaine 4% T opical Solution. No specimens were taken. A time out was conducted at 10:40, prior to the start of the procedure. A Minimum amount of bleeding was controlled with Pressure. The procedure was tolerated well. Post Debridement Measurements: 6.8cm length x 5cm width x 0.2cm depth; 5.341cm^3 volume. Character of Wound/Ulcer Post Debridement is improved. Post procedure Diagnosis Wound #17: Same as Pre-Procedure General  Notes: scribed for Dr. Celine [name] by [name] Peals, RN. Wound #21 Pre-procedure diagnosis of Wound #21 is a [name] Cell Lesion located on the Right,Medial Ankle .Severity of Tissue Pre Debridement is: Fat layer exposed. There was a Selective/Open Wound Non-Viable Tissue Debridement with a total area of 12 sq cm performed by [name] Maudlin, MD. With the following instrument(s): Curette to remove Non-Viable tissue/material. Material removed includes Eschar and Slough and after achieving pain control using Lidocaine 4% Topical Solution. No specimens were taken. A time out was conducted at 10:40, prior to the start of the procedure. A Minimum amount of bleeding was controlled with Pressure. The procedure was tolerated well. Post Debridement Measurements: 6.8cm length x 3cm width x 0.1cm  depth; 1.602cm^3 volume. Character of Wound/Ulcer Post Debridement is improved. Severity of Tissue Post Debridement is: Fat layer exposed. Post procedure Diagnosis Wound #21: Same as Pre-Procedure General Notes: scribed for Dr. Celine [name] by [name] Peals, RN. Pre-procedure diagnosis of Wound #21 is a [name] Cell Lesion located on the Right,Medial Ankle . There was a Double Layer Compression Therapy Procedure by [name] Peals, RN. Post procedure Diagnosis Wound #21: Same as Pre-Procedure Wound #24 Pre-procedure diagnosis of Wound #24 is a [name] Cell Lesion located on the Left,Lateral Ankle . There was a Double Layer Compression Therapy Procedure by [name] Peals, RN. Post procedure Diagnosis Wound #24: Same as Pre-Procedure Plan Follow-up Appointments: Return Appointment in 1 week. - Dr. Celine [name] - room 2 Anesthetic: (In clinic) Topical Lidocaine 4% applied to wound bed [name], [name] ([id number]) [id number] Page 14 of 17 Cellular or Tissue Based Products: Other Cellular or Tissue Based Products Orders/Instructions: - Theraskin applied by Psychiatric nurse, leave on until  further notice Bathing/ Shower/ Hygiene: Do not shower or bathe in tub. Edema Control - Lymphedema / SCD / Other: Avoid standing for long periods of time. Exercise regularly Additional Orders / Instructions: Follow Nutritious Diet Juven Shake 1-2 times daily. The following medication(s) was prescribed: lidocaine topical 4 % cream cream topical was prescribed at facility WOUND #17: - Lower Leg Wound Laterality: Right, Lateral Cleanser: Soap and Water 2 x Per Day/30 Days Discharge Instructions: May shower and wash wound with dial antibacterial soap and water prior to dressing change. Cleanser: Wound Cleanser 2 x Per Day/30 Days Discharge Instructions: Cleanse the wound with wound cleanser prior to applying a clean dressing using gauze sponges, not tissue or cotton balls. Peri-Wound Care: Triamcinolone 15 (g) 2 x Per Day/30 Days Discharge Instructions: Use triamcinolone 15 (g) as directed Peri-Wound Care: Sween Lotion (Moisturizing lotion) 2 x Per Day/30 Days Discharge Instructions: Apply moisturizing lotion as directed Topical: Skintegrity Hydrogel 4 (oz) 2 x Per Day/30 Days Discharge Instructions: Apply hydrogel as directed Prim Dressing: Promogran Prisma Matrix, 4.34 (sq in) (silver collagen) 2 x Per Day/30 Days ary Discharge Instructions: Moisten collagen with saline or hydrogel Secondary Dressing: ABD Pad, 5x9 2 x Per Day/30 Days Discharge Instructions: Apply over primary dressing as directed. Com pression Wrap: Urgo K2 Lite, two layer compression system, regular 2 x Per Day/30 Days Discharge Instructions: Apply Urgo K2 Lite as directed (alternative to 3 layer compression). WOUND #21: - Ankle Wound Laterality: Right, Medial Cleanser: Soap and Water 2 x Per Day/30 Days Discharge Instructions: May shower and wash wound with dial antibacterial soap and water prior to dressing change. Cleanser: Wound Cleanser 2 x Per Day/30 Days Discharge Instructions: Cleanse the wound with wound  cleanser prior to applying a clean dressing using gauze sponges, not tissue or cotton balls. Peri-Wound Care: Triamcinolone 15 (g) 2 x Per Day/30 Days Discharge Instructions: Use triamcinolone 15 (g) as directed Peri-Wound Care: Sween Lotion (Moisturizing lotion) 2 x Per Day/30 Days Discharge Instructions: Apply moisturizing lotion as directed Topical: Skintegrity Hydrogel 4 (oz) 2 x Per Day/30 Days Discharge Instructions: Apply hydrogel as directed Prim Dressing: Promogran Prisma Matrix, 4.34 (sq in) (silver collagen) 2 x Per Day/30 Days ary Discharge Instructions: Moisten collagen with saline or hydrogel Secondary Dressing: ABD Pad, 5x9 2 x Per Day/30 Days Discharge Instructions: Apply over primary dressing as directed. Com pression Wrap: Urgo K2 Lite, two layer compression system, regular 2 x Per Day/30 Days Discharge Instructions: Apply Urgo K2 Lite as directed (alternative to 3 layer compression). WOUND #24: -  Ankle Wound Laterality: Left, Lateral Cleanser: Soap and Water 2 x Per Day/30 Days Discharge Instructions: May shower and wash wound with dial antibacterial soap and water prior to dressing change. Cleanser: Wound Cleanser 2 x Per Day/30 Days Discharge Instructions: Cleanse the wound with wound cleanser prior to applying a clean dressing using gauze sponges, not tissue or cotton balls. Peri-Wound Care: Triamcinolone 15 (g) 2 x Per Day/30 Days Discharge Instructions: Use triamcinolone 15 (g) as directed Peri-Wound Care: Sween Lotion (Moisturizing lotion) 2 x Per Day/30 Days Discharge Instructions: Apply moisturizing lotion as directed Prim Dressing: IODOFLEX 0.9% Cadexomer Iodine Pad 4x6 cm 2 x Per Day/30 Days ary Discharge Instructions: Apply to wound bed as instructed Secondary Dressing: ABD Pad, 5x9 2 x Per Day/30 Days Discharge Instructions: Apply over primary dressing as directed. Com pression Wrap: Urgo K2 Lite, two layer compression system, regular 2 x Per Day/30  Days Discharge Instructions: Apply Urgo K2 Lite as directed (alternative to 3 layer compression). 08/09/2022: Unfortunately, she has a new wound on her left lateral lower leg, just above the ankle. It is extremely painful for her. It penetrates to the fat layer and has the same woody, fibrotic character as her previous wounds. The other wounds are both a little bit smaller with slough and some eschar around the periphery. She would not permit debridement of the new wound on the left. We will apply Iodoflex for chemical debridement during the week and 3 layer compression. I was able to debride slough and eschar off of both of the other wounds. I am going to start using Prisma silver collagen moistened with hydrogel to each of these, under 3 layer compression as well. She will follow-up in 1 week. Electronic Signature(s) Signed: 08/09/2022 11:35:45 AM By: [name] Maudlin MD FACS Entered By: [name] on 08/09/2022 11:35:44 -------------------------------------------------------------------------------- HxROS Details Patient Name: Date of Service: [name], [name] 08/09/2022 10:00 A M Medical Record Number: [id number] Patient Account Number: [id number] Date of Birth/Sex: Treating RN: 08-May-1971 (52 y.o. F) Primary Care Provider: Cammie [name] Other Clinician: Eula [name] ([id number]) [id number] Page 15 of 17 Referring Provider: Treating Provider/Extender: [name] Weeks in Treatment: 68 Information Obtained From Patient Constitutional Symptoms ([place]) Medical History: Past Medical History Notes: H/O miscarriage Eyes Medical History: Negative for: Cataracts; Glaucoma; Optic Neuritis Ear/Nose/Mouth/Throat Medical History: Negative for: Chronic sinus problems/congestion; Middle ear problems Hematologic/Lymphatic Medical History: Positive for: Anemia; [name] Cell Disease Negative for: Hemophilia; Human  Immunodeficiency Virus; Lymphedema Respiratory Medical History: Negative for: Aspiration; Asthma; Chronic Obstructive Pulmonary Disease (COPD); Pneumothorax; Sleep Apnea; Tuberculosis Cardiovascular Medical History: Negative for: Angina; Arrhythmia; Congestive Heart Failure; Coronary Artery Disease; Deep Vein Thrombosis; Hypertension; Hypotension; Myocardial Infarction; Peripheral Arterial Disease; Peripheral Venous Disease; Phlebitis; Vasculitis Past Medical History Notes: bradycardia Gastrointestinal Medical History: Negative for: Cirrhosis ; Colitis; Crohns; Hepatitis A; Hepatitis B; Hepatitis C Past Medical History Notes: cholilithiasis Endocrine Medical History: Negative for: Type I Diabetes; Type II Diabetes Genitourinary Medical History: Negative for: End Stage Renal Disease Immunological Medical History: Negative for: Lupus Erythematosus; Raynauds; Scleroderma Integumentary (Skin) Medical History: Negative for: History of Burn Musculoskeletal Medical History: Negative for: Gout; Rheumatoid Arthritis; Osteoarthritis; Osteomyelitis Neurologic Medical History: Positive for: Neuropathy - right foot intermittant Negative for: Dementia; Quadriplegia; Paraplegia; Seizure Disorder JANHAVI, GUYE ([id number]) [id number] Page 16 of 17 Oncologic Medical History: Negative for: Received Chemotherapy; Received Radiation Psychiatric Medical History: Negative for: Anorexia/bulimia; Confinement Anxiety Immunizations Pneumococcal Vaccine: Received Pneumococcal Vaccination: No Implantable Devices None Hospitalization / Surgery History Type  of Hospitalization/Surgery c section x2 left breast lumpectomy iandD right ankle with theraskin Family and Social History Cancer: No; Diabetes: Yes - Mother; Heart Disease: No; Hereditary Spherocytosis: No; Hypertension: No; Kidney Disease: No; Lung Disease: Yes - Mother; Seizures: No; Stroke: No; Thyroid  Problems: No; Tuberculosis: No; Never smoker; Marital Status - Married; Alcohol Use: Never; Drug Use: No History; Caffeine Use: Daily; Financial Concerns: No; Food, Clothing or Shelter Needs: No; Support System Lacking: No; Transportation Concerns: No Electronic Signature(s) Signed: 08/09/2022 1:35:04 PM By: [name] Maudlin MD FACS Entered By: [name] on 08/09/2022 11:33:45 -------------------------------------------------------------------------------- SuperBill Details Patient Name: Date of Service: [place], [name] 08/09/2022 Medical Record Number: [id number] Patient Account Number: [id number] Date of Birth/Sex: Treating RN: September 06, 1970 (52 y.o. 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 Facility Procedures : CPT4 Code: [id number] Description: [phone] - DEBRIDE WOUND 1ST 20 SQ CM OR < ICD-10 Diagnosis Description L97.818 Non-pressure chronic ulcer of other part of right lower leg with other specified s Modifier: everity Quantity: 1 : CPT4 Code: [id number] Description: [id number] - DEBRIDE WOUND EA ADDL 20 SQ CM ICD-10 Diagnosis Description L97.818 Non-pressure chronic ulcer of other part of right lower leg with other specified s Modifier: everity Quantity: 1 Physician Procedures : CPT4 Code Description Modifier [id number] - WC PHYS LEVEL 3 - EST PT 25 ICD-10 Diagnosis Description L97.818 Non-pressure chronic ulcer of other part of right lower leg with other specified severity L97.822 Non-pressure chronic ulcer of other part  of left lower leg with fat layer exposed D57.1 [name]-cell disease  without crisis MENDI, SWAPP ([id number]) [id number] Quantity: 1 .pdf Page 17 of 17 : [id number] - WC PHYS DEBR WO ANESTH 20 SQ CM 1 ICD-10 Diagnosis Description L97.818 Non-pressure chronic ulcer of other part of right lower leg with other specified severity Quantity: : [id number] - WC PHYS DEBR WO ANESTH EA ADD 20 CM 1 ICD-10 Diagnosis Description L97.818 Non-pressure chronic ulcer of other part of right lower leg with other specified severity Quantity: Electronic Signature(s) Signed: 08/09/2022 11:37:12 AM By: [name] Maudlin MD FACS Entered By: [name] on 08/09/2022 11:37:12

## 2022-08-12 ENCOUNTER — Other Ambulatory Visit: Payer: Self-pay | Admitting: Family Medicine

## 2022-08-12 DIAGNOSIS — G894 Chronic pain syndrome: Secondary | ICD-10-CM

## 2022-08-12 MED ORDER — OXYCODONE HCL 10 MG PO TABS: 10.0000 mg | ORAL_TABLET | ORAL | 0 refills | Status: DC

## 2022-08-12 NOTE — Progress Notes (Signed)
Reviewed PDMP substance reporting system prior to prescribing opiate medications. No inconsistencies noted.    Meds ordered this encounter  Medications   Oxycodone HCl 10 MG TABS    Sig: Take 1 tablet (10 mg total) by mouth every 4 (four) hours while awake.    Dispense:  90 tablet    Refill:  0    Order Specific Question:   Supervising Provider    Answer:   JEGEDE, OLUGBEMIGA E [[id number]]    Dorann Davidson Moore Chriss Mannan  APRN, MSN, FNP-C [facility] [address]  [address] [address], [place] [address] [phone]  

## 2022-08-14 ENCOUNTER — Encounter (HOSPITAL_BASED_OUTPATIENT_CLINIC_OR_DEPARTMENT_OTHER): Payer: Medicaid Other | Admitting: General Surgery

## 2022-08-14 DIAGNOSIS — L97818 Non-pressure chronic ulcer of other part of right lower leg with other specified severity: Secondary | ICD-10-CM | POA: Diagnosis not present

## 2022-08-15 NOTE — Progress Notes (Signed)
[name] ([id number]) [id number] Page 1 of 10 Visit Report for 08/14/2022 Arrival Information Details Patient Name: Date of Service: [name] [name] 08/14/2022 8:30 A M Medical Record Number: [id number] Patient Account Number: [id number] Date of Birth/Sex: Treating RN: Sep 17, 1970 (52 y.o. F) Primary Care [name]: [name] Other Clinician: Referring [name]: Treating [name]/Extender: [name]: 70 Visit Information History Since Last Visit All ordered tests and consults were completed: No Patient Arrived: [name] Added or deleted any medications: No Arrival Time: 08:32 Any new allergies or adverse reactions: No Accompanied By: self Had a fall or experienced change in No Transfer Assistance: None activities of daily living that may affect Patient Identification Verified: Yes risk of falls: Secondary Verification Process Completed: Yes Signs or symptoms of abuse/neglect since last visito No Patient Requires Transmission-Based Precautions: No Hospitalized since last visit: No Patient Has Alerts: No Implantable device outside of the clinic excluding No cellular tissue based products placed in the center since last visit: Pain Present Now: No Electronic Signature(s) Signed: 08/14/2022 12:08:21 PM By: Worthy Rancher Entered By: Worthy Rancher on 08/14/2022 08:33:05 -------------------------------------------------------------------------------- Compression Therapy Details Patient Name: Date of Service: [name] [name], [name] 08/14/2022 8:30 A M Medical Record Number: [id number] Patient Account Number: [id number] Date of Birth/Sex: Treating RN: 09-02-1970 (52 y.o. [name] Primary Care Alexy Bringle: [name] Other Clinician: Referring Amela Handley: Treating [name]/Extender: [name]: 21 Compression Therapy Performed for Wound Assessment: Wound #17  Right,Lateral Lower Leg Performed By: Clinician [name] Peals, RN Compression Type: Double Layer Post Procedure Diagnosis Same as Pre-procedure Electronic Signature(s) Signed: 08/14/2022 4:53:25 PM By: [name] Entered By: [name] on 08/14/2022 08:55:26 -------------------------------------------------------------------------------- Compression Therapy Details Patient Name: Date of Service: [name] [name], [name] 08/14/2022 8:30 A M Medical Record Number: [id number] Patient Account Number: [id number] Date of Birth/Sex: Treating RN: 27-Mar-1971 (52 y.o. [name] Primary Care Petr Bontempo: [name] Other Clinician: Referring Marrion Finan: Treating Jaivion Kingsley/Extender: [name]: 75 Compression Therapy Performed for Wound Assessment: Wound #24 Left,Lateral Ankle Performed By: Clinician [name] Peals, RN Compression Type: Double Layer Post Procedure Diagnosis Same as [name] ([id number] Page 2 of 10 Electronic Signature(s) Signed: 08/14/2022 4:53:25 PM By: [name] By: [name] on 08/14/2022 08:55:27 -------------------------------------------------------------------------------- Encounter Discharge Information Details Patient Name: Date of Service: [name], [name] 08/14/2022 8:30 A M Medical Record Number: [id number] Patient Account Number: [id number] Date of Birth/Sex: Treating RN: Sep 23, 1970 (52 y.o. [name] Primary Care Saori Umholtz: [name] Other Clinician: Referring [name]: Treating [name]: 39 Encounter Discharge Information Items Post Procedure Vitals Discharge Condition: Stable Temperature (F): 98 Ambulatory Status: Cane Pulse (bpm): 66 Discharge Destination: Home Respiratory Rate (breaths/min): 20 Transportation: Private  Auto Blood Pressure (mmHg): 122/73 Accompanied By: self Schedule Follow-up Appointment: Yes Clinical Summary of Care: Patient Declined Electronic Signature(s) Signed: 08/14/2022 4:53:25 PM By: [name] Entered By: [name] on 08/14/2022 09:33:04 -------------------------------------------------------------------------------- Lower Extremity Assessment Details Patient Name: Date of Service: [name], [name] [name] 08/14/2022 8:30 A M Medical Record Number: [id number] Patient Account Number: [id number] Date of Birth/Sex: Treating RN: [birth year] (52 y.o. [name] Primary Care [name]: [name] Other Clinician: Referring [name]: Treating [name]/Extender: [name]: 69 Edema Assessment Assessed: [Left: No] [Right: No] Edema: [Left: Ye] [Right: s] Calf Left: Right: Point of Measurement: 33 cm From Medial Instep 30  cm 31 cm Ankle Left: Right: Point of Measurement: 10 cm From Medial Instep 18.5 cm 21 cm Vascular Assessment Pulses: Dorsalis Pedis Palpable: [Left:Yes] [Right:Yes] Electronic Signature(s) Signed: 08/14/2022 4:53:25 PM By: [name] Entered By: [name] on 08/14/2022 08:37:09 -------------------------------------------------------------------------------- Multi Wound Chart Details Patient Name: Date of Service: [name], [name] 08/14/2022 8:30 A Gardiner Sleeper ([id number] Page 3 of 10 Medical Record Number: [id number] Patient Account Number: [id number] Date of Birth/Sex: Treating RN: 02-20-1971 (52 y.o. F) Primary Care Caddie Randle: [name] Other Clinician: Referring Trace Cederberg: Treating Isiac Breighner/Extender: [name]: 61 Vital Signs Height(in): 67 Pulse(bpm): 30 Weight(lbs): 134 Blood Pressure(mmHg): 122/73 Body Mass Index(BMI): 21 Temperature(F): 98.0 Respiratory  Rate(breaths/min): 20 [17:Photos:] Right, Lateral Lower Leg Right, Medial Ankle Left, Lateral Ankle Wound Location: Gradually Appeared Gradually Appeared Gradually Appeared Wounding Event: Sickle Cell Lesion Sickle Cell Lesion Sickle Cell Lesion Primary Etiology: N/A Venous Leg Ulcer N/A Secondary Etiology: Anemia, Sickle Cell Disease, Anemia, Sickle Cell Disease, Anemia, Sickle Cell Disease, Comorbid History: Neuropathy Neuropathy Neuropathy 10/05/2012 06/26/2021 08/06/2022 Date Acquired: 81 59 0 Weeks of [name]: Open Open Open Wound Status: No No No Wound Recurrence: Yes No No Clustered Wound: 6.3x5.3x0.2 7.5x3.4x0.1 1.4x1.3x0.1 Measurements L x W x D (cm) 26.224 20.028 1.429 A (cm) : rea 5.245 2.003 0.143 Volume (cm) : 86.00% 30.90% -10.30% % Reduction in A rea: 72.10% 30.90% -10.00% % Reduction in Volume: Full Thickness Without Exposed Full Thickness Without Exposed Full Thickness Without Exposed Classification: Support Structures Support Structures Support Structures Medium Medium Medium Exudate A mount: Purulent Purulent Serosanguineous Exudate Type: yellow, brown, green yellow, brown, green red, brown Exudate Color: Distinct, outline attached Distinct, outline attached Distinct, outline attached Wound Margin: Medium (34-66%) Medium (34-66%) Small (1-33%) Granulation A mount: Pink Pink Pink Granulation Quality: Medium (34-66%) Medium (34-66%) Large (67-100%) Necrotic A mount: Fat Layer (Subcutaneous Tissue): Yes Fat Layer (Subcutaneous Tissue): Yes Fat Layer (Subcutaneous Tissue): Yes Exposed Structures: Fascia: No Fascia: No Fascia: No Tendon: No Tendon: No Tendon: No Muscle: No Muscle: No Muscle: No Joint: No Joint: No Joint: No Bone: No Bone: No Bone: No Small (1-33%) Small (1-33%) None Epithelialization: Debridement - Selective/Open Wound Debridement - Selective/Open Wound N/A Debridement: Pre-procedure Verification/Time Out 08:52  08:52 N/A Taken: Lidocaine 5% topical ointment Lidocaine 5% topical ointment N/A Pain Control: Psychologist, prison and probation services, [employer] N/A Tissue Debrided: Non-Viable Tissue Non-Viable Tissue N/A Level: 30 14 N/A Debridement A (sq cm): rea Curette Curette N/A Instrument: Minimum Minimum N/A Bleeding: Pressure Pressure N/A Hemostasis A chieved: Procedure was tolerated well Procedure was tolerated well N/A Debridement [name] Response: 6.3x5.3x0.2 7.5x3.4x0.1 N/A Post Debridement Measurements L x W x D (cm) 5.245 2.003 N/A Post Debridement Volume: (cm) Scarring: Yes Scarring: Yes No Abnormalities Noted Periwound Skin Texture: No Abnormalities Noted No Abnormalities Noted No Abnormalities Noted Periwound Skin Moisture: Ecchymosis: No No Abnormalities Noted No Abnormalities Noted Periwound Skin Color: No Abnormality No Abnormality No Abnormality Temperature: Yes Yes Yes Tenderness on Palpation: Compression Therapy Debridement Compression Therapy Procedures Performed: Debridement [name] Notes AREATHA, SEEGERT ([id number] Page 4 of 10 Electronic Signature(s) Signed: 08/14/2022 9:09:25 AM By: [name] Maudlin MD FACS Entered By: [name] on 08/14/2022 09:09:25 -------------------------------------------------------------------------------- Multi-Disciplinary Care Plan Details Patient Name: Date of Service: [name], [name] 08/14/2022 8:30 A M Medical Record Number: [id number] Patient Account Number: [id number] Date of Birth/Sex: Treating RN: 01/24/1971 (52 y.o. [name] Primary Care Srija Southard: [name] Other Clinician: Referring Karilyn Wind: Treating Sephiroth Mcluckie/Extender: [name]  [name] Weeks in [name]: 69 Multidisciplinary Care Plan reviewed with physician Active Inactive Venous Leg Ulcer Nursing Diagnoses: Actual venous Insuffiency (use after diagnosis is confirmed) Knowledge deficit  related to disease process and management Goals: Patient will maintain optimal edema control Date Initiated: 06/26/2021 Target Resolution Date: 09/20/2022 Goal Status: Active Interventions: Assess peripheral edema status every visit. Compression as ordered [name] Activities: Therapeutic compression applied : 06/26/2021 Notes: Wound/Skin Impairment Nursing Diagnoses: Impaired tissue integrity Goals: Patient/caregiver will verbalize understanding of skin care regimen Date Initiated: 04/17/2021 Target Resolution Date: 09/20/2022 Goal Status: Active Ulcer/skin breakdown will have a volume reduction of 30% by week 4 Date Initiated: 04/17/2021 Date Inactivated: 05/29/2021 Target Resolution Date: 05/15/2021 Goal Status: Met Ulcer/skin breakdown will have a volume reduction of 50% by week 8 Date Initiated: 05/29/2021 Date Inactivated: 06/26/2021 Target Resolution Date: 06/26/2021 Goal Status: Unmet Unmet Reason: venous reflux Interventions: Assess patient/caregiver ability to obtain necessary supplies Assess patient/caregiver ability to perform ulcer/skin care regimen upon admission and as needed Assess ulceration(s) every visit Provide education on ulcer and skin care [name] Activities: Topical wound management initiated : 04/17/2021 Notes: 06/08/21: Left leg wounds greater than 30% volume reduction, right leg acute infection. Electronic Signature(s) Signed: 08/14/2022 4:53:25 PM By: [name] Entered By: [name] on 08/14/2022 08:56:05 [name] ([id number] Page 5 of 10 -------------------------------------------------------------------------------- Pain Assessment Details Patient Name: Date of Service: [name] [name] 08/14/2022 8:30 A M Medical Record Number: [id number] Patient Account Number: [id number] Date of Birth/Sex: Treating RN: Mar 22, 1971 (52 y.o. F) Primary Care Kailea Dannemiller: [name] Other  Clinician: Referring Jearlene Bridwell: Treating Calle Schader/Extender: [name]: 47 Active Problems Location of Pain Severity and Description of Pain Patient Has Paino Yes Site Locations Rate the pain. Current Pain Level: 5 Worst Pain Level: 10 Least Pain Level: 0 Tolerable Pain Level: 2 Pain Management and Medication Current Pain Management: Electronic Signature(s) Signed: 08/14/2022 12:08:21 PM By: Worthy Rancher Entered By: Worthy Rancher on 08/14/2022 08:33:53 -------------------------------------------------------------------------------- Patient/Caregiver Education Details Patient Name: Date of Service: [name], [name] 3/27/2024andnbsp8:30 [place] Record Number: [id number] Patient Account Number: [id number] Date of Birth/Gender: Treating RN: April 17, 1971 (52 y.o. [name] Primary Care Physician: [name] Other Clinician: Referring Physician: Treating Physician/Extender: [name]: 72 Education Assessment Education Provided To: Patient Education Topics Provided Infection: Methods: Explain/Verbal Responses: Reinforcements needed, State content correctly Electronic Signature(s) Signed: 08/14/2022 4:53:25 PM By: [name] Entered By: [name] on 08/14/2022 08:56:18 [name] ([id number] Page 6 of 10 -------------------------------------------------------------------------------- Wound Assessment Details Patient Name: Date of Service: [name], Callender [name] 08/14/2022 8:30 A M Medical Record Number: [id number] Patient Account Number: [id number] Date of Birth/Sex: Treating RN: February 06, 1971 (52 y.o. [name] Primary Care Agam Tuohy: [name] Other Clinician: Referring Corleone Biegler: Treating Jedd Schulenburg/Extender: [name]: 69 Wound Status Wound Number: 17 Primary  Etiology: Sickle Cell Lesion Wound Location: Right, Lateral Lower Leg Wound Status: Open Wounding Event: Gradually Appeared Comorbid History: Anemia, Sickle Cell Disease, Neuropathy Date Acquired: 10/05/2012 Weeks Of [name]: 69 Clustered Wound: Yes Photos Wound Measurements Length: (cm) 6.3 Width: (cm) 5.3 Depth: (cm) 0.2 Area: (cm) 26.224 Volume: (cm) 5.245 % Reduction in Area: 86% % Reduction in Volume: 72.1% Epithelialization: Small (1-33%) Tunneling: No Undermining: No Wound Description Classification: Full Thickness Without Exposed Support Structures Wound Margin: Distinct, outline attached Exudate Amount: Medium Exudate Type: Purulent Exudate Color: yellow, brown, green Foul Odor After Cleansing:  No Slough/Fibrino Yes Wound Bed Granulation Amount: Medium (34-66%) Exposed Structure Granulation Quality: Pink Fascia Exposed: No Necrotic Amount: Medium (34-66%) Fat Layer (Subcutaneous Tissue) Exposed: Yes Necrotic Quality: Adherent Slough Tendon Exposed: No Muscle Exposed: No Joint Exposed: No Bone Exposed: No Periwound Skin Texture Texture Color No Abnormalities Noted: No No Abnormalities Noted: No Scarring: Yes Ecchymosis: No Moisture Temperature / Pain No Abnormalities Noted: Yes Temperature: No Abnormality Tenderness on Palpation: Yes [name] Notes Wound #17 (Lower Leg) Wound Laterality: Right, Lateral Cleanser Soap and Water Discharge Instruction: May shower and wash wound with dial antibacterial soap and water prior to dressing change. Wound Cleanser Discharge Instruction: Cleanse the wound with wound cleanser prior to applying a clean dressing using gauze sponges, not tissue or cotton balls. [name], [name] ([id number]) [id number] Page 7 of 10 Peri-Wound Care Triamcinolone 15 (g) Discharge Instruction: Use triamcinolone 15 (g) as directed Sween Lotion (Moisturizing lotion) Discharge Instruction: Apply moisturizing  lotion as directed Topical Skintegrity Hydrogel 4 (oz) Discharge Instruction: Apply hydrogel as directed Primary Dressing Promogran Prisma Matrix, 4.34 (sq in) (silver collagen) Discharge Instruction: Moisten collagen with saline or hydrogel Secondary Dressing ABD Pad, 5x9 Discharge Instruction: Apply over primary dressing as directed. CarboFLEX Odor Control Dressing, 4x4 in Discharge Instruction: Apply over primary dressing as directed. Secured With Compression Wrap Urgo K2 Lite, two layer compression system, regular Discharge Instruction: Apply Urgo K2 Lite as directed (alternative to 3 layer compression). Compression Stockings Add-Ons Electronic Signature(s) Signed: 08/14/2022 12:08:21 PM By: Worthy Rancher Signed: 08/14/2022 4:53:25 PM By: [name] Entered By: Worthy Rancher on 08/14/2022 08:40:49 -------------------------------------------------------------------------------- Wound Assessment Details Patient Name: Date of Service: [name], [name] 08/14/2022 8:30 A M Medical Record Number: [id number] Patient Account Number: [id number] Date of Birth/Sex: Treating RN: [birth year] (52 y.o. [name] Primary Care Elisabet Gutzmer: [name] Other Clinician: Referring Smokey Melott: Treating Ercole Georg/Extender: [name]: 69 Wound Status Wound Number: 21 Primary Etiology: Sickle Cell Lesion Wound Location: Right, Medial Ankle Secondary Etiology: Venous Leg Ulcer Wounding Event: Gradually Appeared Wound Status: Open Date Acquired: 06/26/2021 Comorbid History: Anemia, Sickle Cell Disease, Neuropathy Weeks Of [name]: 59 Clustered Wound: No Photos Wound Measurements Length: (cm) 7.5 Width: (cm) 3.4 Depth: (cm) 0.1 Area: (cm) 20.[place] ([id number]) Volume: (cm) 2.003 % Reduction in Area: 30.9% % Reduction in Volume: 30.9% Epithelialization: Small (1-33%) Tunneling: No [phone]  Page 8 of 10 Undermining: No Wound Description Classification: Full Thickness Without Exposed Support Structures Wound Margin: Distinct, outline attached Exudate Amount: Medium Exudate Type: Purulent Exudate Color: yellow, brown, green Foul Odor After Cleansing: No Slough/Fibrino Yes Wound Bed Granulation Amount: Medium (34-66%) Exposed Structure Granulation Quality: Pink Fascia Exposed: No Necrotic Amount: Medium (34-66%) Fat Layer (Subcutaneous Tissue) Exposed: Yes Necrotic Quality: Adherent Slough Tendon Exposed: No Muscle Exposed: No Joint Exposed: No Bone Exposed: No Periwound Skin Texture Texture Color No Abnormalities Noted: No No Abnormalities Noted: Yes Scarring: Yes Temperature / Pain Temperature: No Abnormality Moisture No Abnormalities Noted: Yes Tenderness on Palpation: Yes [name] Notes Wound #21 (Ankle) Wound Laterality: Right, Medial Cleanser Soap and Water Discharge Instruction: May shower and wash wound with dial antibacterial soap and water prior to dressing change. Wound Cleanser Discharge Instruction: Cleanse the wound with wound cleanser prior to applying a clean dressing using gauze sponges, not tissue or cotton balls. Peri-Wound Care Triamcinolone 15 (g) Discharge Instruction: Use triamcinolone 15 (g) as directed Sween Lotion (Moisturizing lotion) Discharge Instruction: Apply moisturizing lotion as directed Topical Skintegrity Hydrogel 4 (oz) Discharge  Instruction: Apply hydrogel as directed Primary Dressing Promogran Prisma Matrix, 4.34 (sq in) (silver collagen) Discharge Instruction: Moisten collagen with saline or hydrogel Secondary Dressing ABD Pad, 5x9 Discharge Instruction: Apply over primary dressing as directed. CarboFLEX Odor Control Dressing, 4x4 in Discharge Instruction: Apply over primary dressing as directed. Secured With Compression Wrap Urgo K2 Lite, two layer compression system, regular Discharge Instruction: Apply Urgo  K2 Lite as directed (alternative to 3 layer compression). Compression Stockings Add-Ons Electronic Signature(s) Signed: 08/14/2022 12:08:21 PM By: Worthy Rancher Signed: 08/14/2022 4:53:25 PM By: [name] Entered By: Worthy Rancher on 08/14/2022 08:41:18 [name] ([id number] Page 9 of 10 -------------------------------------------------------------------------------- Wound Assessment Details Patient Name: Date of Service: [name], Walsh [name] 08/14/2022 8:30 A M Medical Record Number: [id number] Patient Account Number: [id number] Date of Birth/Sex: Treating RN: April 28, 1971 (52 y.o. [name] Primary Care Carollee Nussbaumer: [name] Other Clinician: Referring Christin Mccreedy: Treating Arnez Stoneking/Extender: [name]: 69 Wound Status Wound Number: 24 Primary Etiology: Sickle Cell Lesion Wound Location: Left, Lateral Ankle Wound Status: Open Wounding Event: Gradually Appeared Comorbid History: Anemia, Sickle Cell Disease, Neuropathy Date Acquired: 08/06/2022 Weeks Of [name]: 0 Clustered Wound: No Photos Wound Measurements Length: (cm) 1.4 Width: (cm) 1.3 Depth: (cm) 0.1 Area: (cm) 1.429 Volume: (cm) 0.143 % Reduction in Area: -10.3% % Reduction in Volume: -10% Epithelialization: None Tunneling: No Undermining: No Wound Description Classification: Full Thickness Without Exposed Support Structures Wound Margin: Distinct, outline attached Exudate Amount: Medium Exudate Type: Serosanguineous Exudate Color: red, brown Foul Odor After Cleansing: No Slough/Fibrino Yes Wound Bed Granulation Amount: Small (1-33%) Exposed Structure Granulation Quality: Pink Fascia Exposed: No Necrotic Amount: Large (67-100%) Fat Layer (Subcutaneous Tissue) Exposed: Yes Necrotic Quality: Adherent Slough Tendon Exposed: No Muscle Exposed: No Joint Exposed: No Bone Exposed: No Periwound Skin  Texture Texture Color No Abnormalities Noted: Yes No Abnormalities Noted: Yes Moisture Temperature / Pain No Abnormalities Noted: Yes Temperature: No Abnormality Tenderness on Palpation: Yes [name] Notes Wound #24 (Ankle) Wound Laterality: Left, Lateral Cleanser Soap and Water Discharge Instruction: May shower and wash wound with dial antibacterial soap and water prior to dressing change. Wound Cleanser Discharge Instruction: Cleanse the wound with wound cleanser prior to applying a clean dressing using gauze sponges, not tissue or cotton balls. Peri-Wound Care [name], [name] ([id number]) [id number] Page 10 of 10 Triamcinolone 15 (g) Discharge Instruction: Use triamcinolone 15 (g) as directed Sween Lotion (Moisturizing lotion) Discharge Instruction: Apply moisturizing lotion as directed Topical Primary Dressing IODOFLEX 0.9% Cadexomer Iodine Pad 4x6 cm Discharge Instruction: Apply to wound bed as instructed Secondary Dressing ABD Pad, 5x9 Discharge Instruction: Apply over primary dressing as directed. CarboFLEX Odor Control Dressing, 4x4 in Discharge Instruction: Apply over primary dressing as directed. Secured With Compression Wrap Urgo K2 Lite, two layer compression system, regular Discharge Instruction: Apply Urgo K2 Lite as directed (alternative to 3 layer compression). Compression Stockings Add-Ons Electronic Signature(s) Signed: 08/14/2022 12:08:21 PM By: Worthy Rancher Signed: 08/14/2022 4:53:25 PM By: [name] Entered By: Worthy Rancher on 08/14/2022 08:42:00 -------------------------------------------------------------------------------- Vitals Details Patient Name: Date of Service: [name], [name] 08/14/2022 8:30 A M Medical Record Number: [id number] Patient Account Number: [id number] Date of Birth/Sex: Treating RN: [birth year] (52 y.o. F) Primary Care Marquelle Balow: [name] Other Clinician: Referring Fidelis Loth: Treating  Vinie Charity/Extender: [name]: 73 Vital Signs Time Taken: 08:33 Temperature (F): 98.0 Height (in): 67 Pulse (bpm): 66 Weight (lbs): 134 Respiratory Rate (breaths/min): 20 Body Mass Index (BMI):  21 Blood Pressure (mmHg): 122/73 Reference Range: 80 - 120 mg / dl Electronic Signature(s) Signed: 08/14/2022 12:08:21 PM By: Worthy Rancher Entered By: Worthy Rancher on 08/14/2022 08:33:28

## 2022-08-15 NOTE — Progress Notes (Addendum)
[name], [name] ([id number]) [id number] Page 1 of 17 Visit Report for 08/14/2022 Chief Complaint Document Details Patient Name: Date of Service: [name] [name] 08/14/2022 8:30 A M Medical Record Number: [id number] Patient Account Number: [id number] Date of Birth/Sex: Treating RN: 04-03-1971 (52 y.o. F) Primary Care Provider: [name] Other Clinician: Referring Provider: Treating Provider/Extender: Elyse Jarvis Weeks in Treatment: 34 Information Obtained from: Patient Chief Complaint the patient is here for evaluation of her bilateral lower extremity sickle cell ulcers 04/17/2021; patient comes in for substantial wounds on the right and left lower leg Electronic Signature(s) Signed: 08/14/2022 9:09:33 AM By: Fredirick Maudlin MD FACS Entered By: Fredirick Maudlin on 08/14/2022 09:09:33 -------------------------------------------------------------------------------- Debridement Details Patient Name: Date of Service: [name], [name] 08/14/2022 8:30 A M Medical Record Number: [id number] Patient Account Number: [id number] Date of Birth/Sex: Treating RN: [birth year] (52 y.o. [name] Primary Care Provider: [name] Other Clinician: Referring Provider: Treating Provider/Extender: Elyse Jarvis Weeks in Treatment: 69 Debridement Performed for Assessment: Wound #21 Right,Medial Ankle Performed By: Physician Fredirick Maudlin, MD Debridement Type: Debridement Severity of Tissue Pre Debridement: Fat layer exposed Level of Consciousness (Pre-procedure): Awake and Alert Pre-procedure Verification/Time Out Yes - 08:52 Taken: Start Time: 08:52 Pain Control: Lidocaine 5% topical ointment T Area Debrided (L x W): otal 7 (cm) x 2 (cm) = 14 (cm) Tissue and other material debrided: Non-Viable, Eschar, Slough, Slough Level: Non-Viable Tissue Debridement Description: Selective/Open Wound Instrument:  Curette Bleeding: Minimum Hemostasis Achieved: Pressure Response to Treatment: Procedure was tolerated well Level of Consciousness (Post- Awake and Alert procedure): Post Debridement Measurements of Total Wound Length: (cm) 7.5 Width: (cm) 3.4 Depth: (cm) 0.1 Volume: (cm) 2.003 Character of Wound/Ulcer Post Debridement: Improved Severity of Tissue Post Debridement: Fat layer exposed Post Procedure Diagnosis Same as Pre-procedure Notes scribed for Dr. Celine Ahr by Adline Peals, RN Electronic Signature(s) Signed: 08/14/2022 9:16:27 AM By: Fredirick Maudlin MD FACS Signed: 08/14/2022 4:53:25 PM By: Ardis Rowan, Legrand Rams ([id number]) PM By: Stevphen Meuse.pdf Page 2 of 17 Signed: 08/14/2022 4:53:25 aylor Entered By: Adline Peals on 08/14/2022 08:54:49 -------------------------------------------------------------------------------- Debridement Details Patient Name: Date of Service: [name] [name] 08/14/2022 8:30 A M Medical Record Number: [id number] Patient Account Number: [id number] Date of Birth/Sex: Treating RN: 1970/06/21 (52 y.o. [name] Primary Care Provider: [name] Other Clinician: Referring Provider: Treating Provider/Extender: Elyse Jarvis Weeks in Treatment: 69 Debridement Performed for Assessment: Wound #17 Right,Lateral Lower Leg Performed By: Physician Fredirick Maudlin, MD Debridement Type: Debridement Level of Consciousness (Pre-procedure): Awake and Alert Pre-procedure Verification/Time Out Yes - 08:52 Taken: Start Time: 08:52 Pain Control: Lidocaine 5% topical ointment T Area Debrided (L x W): otal 6 (cm) x 5 (cm) = 30 (cm) Tissue and other material debrided: Non-Viable, Slough, Slough Level: Non-Viable Tissue Debridement Description: Selective/Open Wound Instrument: Curette Specimen: Tissue Culture Number of Specimens T aken: 1 Bleeding: Minimum Hemostasis  Achieved: Pressure Response to Treatment: Procedure was tolerated well Level of Consciousness (Post- Awake and Alert procedure): Post Debridement Measurements of Total Wound Length: (cm) 6.3 Width: (cm) 5.3 Depth: (cm) 0.2 Volume: (cm) 5.245 Character of Wound/Ulcer Post Debridement: Improved Post Procedure Diagnosis Same as Pre-procedure Notes scribed for Dr. Celine Ahr by Adline Peals, RN Electronic Signature(s) Signed: 08/15/2022 4:40:02 PM By: Adline Peals Signed: 08/16/2022 8:14:24 AM By: Fredirick Maudlin MD FACS Previous Signature: 08/14/2022 9:16:27 AM Version By: Fredirick Maudlin MD FACS Previous Signature: 08/14/2022 4:53:25 PM Version By: Adline Peals Entered By:  Adline Peals on 08/15/2022 16:38:58 -------------------------------------------------------------------------------- HPI Details Patient Name: Date of Service: [name] [name] 08/14/2022 8:30 A M Medical Record Number: [id number] Patient Account Number: [id number] Date of Birth/Sex: Treating RN: July 09, 1970 (52 y.o. F) Primary Care Provider: [name] Other Clinician: Referring Provider: Treating Provider/Extender: Elyse Jarvis Weeks in Treatment: 78 History of Present Illness Location: medial and lateral ankle region on the right and left medial malleolus Quality: Patient reports experiencing a shooting pain to affected area(s). Severity: Patient states wound(s) are getting worse. Duration: right lower extremity bimalleolar ulcers have been present for approximately 2 years; the rright meedial malleolus ulcer has been there proximally 6 months Timing: Pain in wound is constant (hurts all the time) Context: The wound would happen gradually ssociated Signs and Symptoms: Patient reports having increase discharge. [name], [name] ([id number]) [id number] Page 3 of 17 HPI Description: 52 year old patient with a history of sickle cell anemia  who was last seen by me with ulceration of the right lower extremity above the ankle and was referred to Dr. Leland Johns for a surgical debridement as I was unable to do anything in the office due to excruciating pain. At that stage she was referred from the plastic surgery service to dermatology who treated her for a skin infection with doxycycline and then Levaquin and a local antibiotic ointment. I understand the patient has since developed ulceration on the left ankle both medial and lateral and was now referred back to the wound center as dermatology has finished the management. I do not have any notes from the dermatology department Old notes: 52 year old patient with a history of sickle cell anemia, pain bilateral lower extremities, right lower extremity ulcer and has a history of receiving a skin graft( Theraskin) several months ago. She has been visiting the wound center [facility] and was seen by Dr. Dellia Nims and Dr. Leland Johns. after prolonged conservator therapy between July 2016 and January 2017. She had been seen by the plastic surgeon and taken to the OR for debridement and application of Theraskin. She had 3 applications of Theraskin and was then treated with collagen. Prior to that she had a history of similar problems in 2014 and was treated conservatively. Had a reflux study done for the right lower extremity in August 2016 without reflux or DVT . Past medical history significant for sickle cell disease, anemia, leg ulcers, cholelithiasis,and has never been a smoker. Once the patient was discharged on the wound center she says within 2 or 3 weeks the problems recurred and she has been treating it conservatively. since I saw her 3 weeks ago at [facility] she has been unable to get her dressing material but has completed a course of doxycycline. 6/7/ 2017 -- lower extremity venous duplex reflux evaluation was done No evidence of SVT or DVT in the RLL. No venous incompetence in the RLL.  No further vascular workup is indicated at this time. She was seen by Dr. [name], on 10/04/2015. She agreed with the plan of taking her to the OR for debridement and application of theraskin and would also take biopsies to rule out pyoderma gangrenosum. Follow-up note dated May 31 received and she was status post application of Theraskin to multiple ulcers around the right ankle. Pathology did not show evidence of malignancy or pyoderma gangrenosum. She would continue to see as in the wound clinic for further care and see Dr. Leland Johns as needed. The patient brought the biopsy report and it was consistent with stasis  ulcer no evidence of malignancy and the comment was that there was some adjacent neovascularization, fibrosis and patchy perivascular chronic inflammation. 11/15/2015 -- today we applied her first application of Theraskin 11/30/15; TheraSkin #2 12/13/2015 -- she is having a lot of pain locally and is here for possible application of a theraskin today. 01/16/2016 -- the patient has significant pain and has noticed despite in spite of all local care and oral pain medication. It is impossible to debride her in the office. 02/06/2016 -- I do not see any notes from Dr. Iran Planas( the patient has not made a call to the office know as she heard from them) and the only visit to recently was with her PCP Dr. Danella Penton -- I saw her on 01/16/2016 and prescribed 90 tablets of oxycodone 10 mg and did lab work and screening for HIV. the HIV was negative and hemoglobin was 6.3 with a WBC count of 14.9 and hematocrit of 17.8 with platelets of 561. 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 The bimalleolar ulcers to the right lower extremity have been present for approximately 2 years, the left medial malleolus ulcer has been present approximately 6 months. She has followed  with Dr.Thimmappa in the past and has had a total of 3 applications of Theraskin (01/2015, 09/2015, 06/17/16). She has also followed with Dr. Con Memos here in the clinic and has received 2 applications of TheraSkin (11/10/15, 11/30/15). The patient does experience chronic, and is not amenable to debridement. 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 She is not currently on any antibiotic therapy and has not been treated with any recently. 07/17/2016 -- was seen by Dr. Iran Planas of plastic surgery who saw her 2 weeks postop application of Theraskin #3. She had removed her dressing and asked her to apply silver alginate on alternate days and follow-up back with the wound center. Future debridements and application of skin substitute would have to be done in the hospital due to her high risk for anesthesia. READMISSION 04/17/2021 Patient is now a 52 year old woman that we have had in this clinic for a prolonged period of time and 2016-2017 and then again for 2 visits in February 2018. At that point she had wounds on the right lower leg predominantly medial. She had also been seen by plastic surgery Dr. Leland Johns who I believe took her to the OR for operative debridement and application of TheraSkin in 2017. After she left our clinic she was followed for a very prolonged period of time in the wound care center in [facility] who then referred her ultimately to [facility] where she was seen by Dr. Vernona Rieger. Again taken her to the OR for skin grafting which apparently did not take. She had multiple other attempts at dressings although I have not really looked over all of these notes in great detail. She has not been seen in a wound care center in about a year. She states over the last year in addition to her right lower leg she has developed wounds on the left lower leg quite extensive. She is using Xeroform to all of these wounds without really any improvement. She also has  Medicaid which does not cover wound products. The patient has had vascular work-ups in the past including most recently on 03/28/2021 showing biphasic waveforms on the right triphasic at the PTA and biphasic at the  dorsalis pedis on the left. She was unable to tolerate any degree of compression to do ABIs. Unfortunately TBI's were also not done. She had venous reflux studies done in 2017. This did not show any evidence of a DVT or SVT and no venous incompetence was noted in the right leg at the time this was the only side with the wound As noted I did not look all over her old records. She apparently had a course of HBO and [facility] although I am not sure what the indication would have been. In any case she developed seizures and terminated treatment earlier. She is generally much more disabled than when we last saw her in clinic. She can no longer walk pretty much wheelchair-bound because predominantly of pain in the left hip. 04/24/2021; the patient tolerated the wraps we put on. We used Santyl and Hydrofera Blue under compression. I brought her back for a nurse visit for a change in dressing. With Medicaid we will have a hard time getting anything paid for and hence the need for compression. She arrives in clinic with all the wounds looking somewhat better in terms of surface 12/20; circumferential wound on the right from the lateral to the medial. She has open areas on the left medial and left lateral x2 on all of this with the same surface. This does not look completely healthy although she does have some epithelialization. She is not complaining of a lot of pain which is unusual for her sickle ulcers. I have not looked over her extensive records from [facility] She had recent arterial studies and has a history of venous reflux studies I will need to look these over although I do not believe she has significant arterial disease 2023 05/22/2021; patient's wound areas measure slightly smaller. Still a  lot of drainage coming from the right we have been using Hydrofera Blue and Santyl with some improvement in the wound surfaces. She tells me she will be getting transfused later in the week for her underlying sickle cell anemia I have looked over her recent arterial studies which were done in the fall. This was in November and showed biphasic and triphasic waveforms but she could not tolerate ABIs because of pressure and unfortunately TBI's were not done. She has not had recent venous reflux studies that I can see 1/10; not much change about the same surface area. This has a yellowish surface to it very gritty. We have been using Santyl and Hydrofera Blue for a prolonged period. Culture I did last week showed methicillin sensitive staph aureus "rare". Our intake nurse reports greenish drainage which may be the Hydrofera Blue itself 1/17; wounds are continue to measure smaller although I am not sure about the accuracy here. Especially the areas on the right are covered in what looks to Laplace, Legrand Rams ([id number]) [id number] Page 4 of 17 be a nonviable surface although she does have some epithelialization. Similarly she has areas on the left medial and left lateral ankle area which appear to have a better surface and perhaps are slightly smaller. We have been using Santyl and Hydrofera Blue. She cannot tolerate mechanical debridements She went for her reflux studies which showed significant reflux at the greater saphenous vein at the saphenofemoral junction as well as the greater saphenous vein in the proximal calf on the left she had reflux in the thigh and the common femoral vein and supra vein Fishel vein reflux in the greater saphenous vein. I will have vein and vascular look at  this. My thoughts have been that these are likely sickle wounds. I looked through her old records from [facility] wound care center and then when she graduated to [facility] wound care  center where she saw Dr. Zigmund Daniel and Dr. Vernona Rieger. Although I can see she had reflux studies done I do not see that she actually saw a vein and vascular. I went over the fact that she had operative debridements and actual skin grafting that did not take. I do not think these wounds have ever really progressed towards healing 1/31;Substantial wounds on the right ankle area. Hyper granulated very gritty adherent debris on the surface. 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 On the left she has no DVT or SVT deep vein reflux in the common femoral vein. Superficial vein reflux in the greater saphenous vein on although the vein diameter is not really all that large. I do not think there is anything that can be done with these although I am going to send her for consultation to vein and vascular. 2/7; Wound exam; substantial wound area on the right posterior ankle area and areas on the left medial ankle and left lateral ankle. I was able to debride the left medial ankle last week fairly aggressively and it is back this week to a completely nonviable surface She will see vascular surgery this Friday and I would like them to review the venous studies and also any comments on her arterial status. If they do not see an issue here I am going to refer her to plastic surgery for an operative debridement perhaps intraoperative ACell or Integra. Eventually she will require a deep tissue culture again 2/14; substantial wound area on the right posterior ankle, medial ankle. We have been using silver alginate The patient was seen by vein and vascular she had both  venous reflux studies and arterial studies. 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 She had no evidence of superficial venous thrombosis. She did have superficial vein reflux at the saphenofemoral junction and the greater saphenous vein. On the left no evidence of a DVT no evidence of superficial venous throat thrombosis she did have deep vein reflux in the common femoral vein and superficial vein reflux in the greater saphenous vein but these were not felt to be amenable to ablation. In terms of arterial studies she had triphasic and wife biphasic wave waveforms bilaterally not felt to have a significant arterial issue. I do not get the feeling that they felt that any part of her nonhealing wounds were related to either arterial or venous issues. They did note that she had venous reflux at the right at the [facility] and [facility]. And also on the left there were reflux in the deep system at the common femoral vein and greater saphenous vein in the proximal thigh. Nothing amenable to ablation. 2/20; she is making some decent progress on the right where there is nice skin between the 2 open areas on the right ankle. The surfaces here do not look viable yet  there is some surrounding epithelialization. She still has a small area on the left medial ankle area. Hyper-granulated Jody's away always 2/28 patient has an appointment with plastic surgery on 3/8. We will see her back on 3/9. She may have to call us to get the area redressed. We've been using Santyl under silver alginate. We made a nice improvement on the left medial ankle. The larger wounds on the right also looks somewhat better in terms of epithelialization although I think they could benefit from an aggressive debridement if plastic surgery would be willing to do that. Perhaps placement of Integra or a cell 07/26/2021: She saw Dr. Claudia Desanctis yesterday. He raised the question as to  whether or not this might be pyoderma and wanted to wait until that question was answered by dermatology before proceeding with any sort of operative debridement. We have continue to use Santyl under silver alginate with Kerlix and Coban wraps. Overall, her wounds appear to be continuing to contract and epithelialize, with some granulation tissue present. There continues to be some slough on all wound surfaces. 08/09/2021: She has not been able to get an appointment with dermatology because apparently the offices in [place] do not accept Medicaid. She is looking into whether or not she can be seen at the main [facility] dermatology clinic. This is necessary because plastic surgery is concerned that her wounds might represent pyoderma and they did not want to do any procedure until that was clarified. We have been using Santyl under silver alginate with Kerlix and Coban wraps. Today, there was a greater amount of drainage on her dressings with a slight green discoloration and significant odor. Despite this, her wounds continue to contract and epithelialize. There is pale granulation tissue present and actually, on the left medial ankle, the granulation tissue is a bit hypertrophic. 08/16/2021: Last week, I took a culture and this grew back rare methicillin-resistant Staph aureus and rare corynebacterium. The MRSA was sensitive to gentamicin which we began applying topically on an empiric basis. This week, her wounds are a bit smaller and the drainage and odor are less. Her primary care provider is working on assisting the patient with a dermatology evaluation. She has been in silver alginate over the gentamicin that was started last week along with Kerlix and Coban wraps. 08/23/2021: Because she has Medicaid, we have been unable to get her into see any dermatologist in the [place] to rule out pyoderma gangrenosum, which was a requirement from plastic surgery prior to any sort of  debridement and grafting. Despite this, however, all of her wounds continue to get smaller. The wound on her left medial ankle is nearly closed. There is no odor from the wounds, although she still accumulates a modest amount of drainage on her dressings. 08/30/2021: The lateral right ankle wound and the medial left ankle wound are a bit smaller today. The medial right ankle wound is about the same size. They are less tender. We have still been unable to get her into dermatology. 09/06/2021: All of the wounds are about the same size today. She continues to endorse minimal pain. I communicated with Dr. Claudia Desanctis in plastic surgery regarding our issues getting a dermatology appointment; he was out of town but indicated that he would look into perhaps performing the biopsy in his office and will have his office contact her. 09/14/2021: The patient has an appointment in dermatology, but it is not until October. Her wounds are roughly the same; she continues  to have very thick purulent-looking drainage on her dressings. 09/20/2021: The left medial wound is nearly closed and just has a bit of accumulated eschar on the surface. The right medial and lateral ankle wounds are perhaps a little bit smaller. They continue to have a very pale surface with accumulation of thin slough. 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 I did not think [employer] was indicated based on this. She is getting topical mupirocin with Prisma silver collagen. 10/04/2021: The patient was not seen in clinic last week due to childcare coverage issues. In the interim, the left medial leg wound has closed. The right sided leg wounds are smaller. There is more granulation tissue coming through, particularly on the lateral wound. The surface remains somewhat gritty. We have been applying topical mupirocin and Prisma silver collagen. 10/11/2021: The left medial leg wound remains closed. She does complain of some anesthetic  sensation to the area. Both of the right-sided leg wounds are smaller but still have accumulated slough. 10/18/2021: Both right-sided leg wounds are minimally smaller this week. She still continues to accumulate slough and has thick drainage on her dressings. 10/23/2021: Both wounds continue to contract. There is still slough buildup. She has been approved for a keratin-based skin substitute trial product but it will not be available until next week. 10/30/2021: The wounds are about the same to perhaps slightly smaller. There is still continued slough buildup. Unfortunately, the rep for the keratin based product did not show up today and did not answer his phone when called. [name], [name] ([id number]) [id number] Page 5 of 17 11/08/2021: The wounds are little bit smaller today. She continues to have thick drainage but the surfaces are relatively clean with just a little bit of slough accumulation. She reported to me today that she is unable to completely flex her left ankle and on examination it seems this is potentially related to scar tissue from her wounds. We do have the ProgenaMatrix trial product available for her today. 11/15/2021: Both wounds are smaller today. There is some slough accumulation on the surfaces, but the medial wound, in particular looks like it is filling in and is less deep. She did hear from physical therapy and she is going to start working with them on July 11. She is here for her second application of the trial skin substitute, ProgenaMatrix. 11/22/2021: Both wounds continue to contract, the medial more dramatically than the lateral. Both wounds have a layer of slough on the surface, but underneath this, the gritty fibrous tissue has a little bit more of a pink cast to it rather than being as pale as it has been. 11/29/2021: The wounds are roughly the same size this week, perhaps a millimeter or 2 smaller. The medial wound has filled in and is nearly  flush with the surrounding skin surface. She continues to have a lot of slough accumulation on both surfaces. 12/06/2021: No significant change to her wounds, but she has a new opening on her dorsal foot, just distal to the right lateral ankle wound. The area on her left medial ankle that reopened looks a little bit larger today. She has quite a bit of pain associated with the new wound. 12/12/2021: Her wounds look about the same but the new opening on her right lateral dorsal foot is a little bit bigger. She continues to have a fair amount of pain with this wound. 12/26/2021: The left medial ankle wound is tiny and superficial. She has 2 areas  of crusting on her left lateral ankle, however, that appear to be threatening to open again. Her right medial ankle wound is a little bit smaller today but still continues to accumulate thick rubbery slough. The new dorsal foot wound is exquisitely painful but there is no odor or purulent drainage. No erythema or induration. The right lateral ankle wound looks about the same today, again with thick rubbery slough. 01/03/2022: The left medial ankle wound has closed again. Both right ankle wounds appear to be about the same size with thick rubbery slough. The dorsal foot wound on the right continues to be quite painful and she stated that she did not want any debridement of that site today. 01/10/2022: No real change to any of her wounds. She continues to accumulate thick slough. The dorsal foot wound has merged with the lateral malleolar wound. She is experiencing significant pain in the dorsal foot portion of the ulcer. 01/16/2022: Absolutely no change or progress in her wounds. 01/24/2022: Her wounds are unchanged. She continues to build up slough and the wounds on her dorsal right foot are still exquisitely tender. 01/31/2022: The wounds actually measure a little bit narrower today. They still have thick slough on the surface but the underlying tissue seems a little  less fibrotic. We changed to Iodosorb last week. 02/07/2022: Wounds continue to slowly epithelialize around the parameters. She has less pain today. She still accumulates a fairly substantial layer of slough. 02/15/2022: No change to the wounds overall. The measured a little bit larger today per the intake nurse. She continues to have substantial slough accumulation and her pain is a little bit worse. 02/21/2022. No change at all to her wounds. I do not see that plastic surgery has received her referral yet. 02/28/2022: The wounds remain unchanged. They are dry and fibrotic with accumulation of slough and eschar. She did receive an appointment to see plastic surgery on October 23, but the office called her back and indicated they needed to reschedule and that the next available appointment was not until December. The patient became angry and decided she did not want to see this plastics group. 10/19; the patient sees Dr. Lovett Calender again next week. She is using Medihoney as the primary dressing changing this herself. 03/21/2022: She saw Dr. Jaclynn Guarneri at the wound care center at [facility]. Dr. Jaclynn Guarneri was also unable to debride her, secondary to pain and is planning to take her to the operating room for operative debridement and potential skin substitute placement. Her wounds are unchanged with thick yellow slough and a fibrotic base. She says they are too painful to be debrided. In addition, yesterday her hemoglobin was 4 and she received 2 units packed red blood cell transfusion. 04/04/2022: Her operation at [facility] is scheduled for December 15. She continues to use Medihoney on her wounds. They are a little bit less painful today and she is willing to entertain the possibility of debridement. The wounds measured a little bit larger in all dimensions today but the layer of slough is not as thick as usual. 04/18/2022: Her wounds actually measured a little bit smaller today and the  extension onto the dorsal foot from the lateral wound has healed. They are less tender and there is significantly less slough present as compared to prior visits. 05/09/2022: She had to reschedule her surgery due to lack of childcare. It is now planned for January 5. The wounds are basically unchanged, but she had a lot more drainage, which  was thick and somewhat purulent in appearance. No significant odor. 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 They are quite painful today and she does not want to have debridement performed. 05/30/2022: The patient underwent surgical debridement and placement of TheraSkin to her wounds on January 5. She has been doing well and does not endorse any pain. The TheraSkin is intact and looks beautiful. It is sutured in place. There is no exudate or significant drainage. 06/04/2022: Her TheraSkin remains intact and I am starting to see granulation tissue emerging underneath the surface. No concern for infection. 06/12/2022: The TheraSkin is intact and there is more granulation tissue emerging. It is starting to look a little bit dry, however. No malodor or purulent drainage. 06/19/2022: The TheraSkin continues to look good. The edges are a bit dry, but the central portion of each of her wounds is showing a nice pink color. 06/27/2022: The TheraSkin on the lateral wound remains almost completely intact. There is nice pink tissue peaking through the mesh of the TheraSkin. On the medial leg, it is starting to breakdown a little bit, but most of the TheraSkin remains intact here, as well. Both wounds measured smaller today. 07/03/2022: The wounds are fairly dry around the perimeter today. Some of the suture is hanging loose. 07/12/2022: Both wounds are measuring smaller today. The medial ulcer is getting a bit too dry. The lateral ulcer continues to have nice buds of granulation tissue emerging. 07/26/2022: The moisture balance on both wounds is much better today. For  the first time since I began seeing her, I see actual beefy red granulation tissue on the lateral ankle wound. There are more buds of deep pink granulation tissue emerging on the medial ankle. There is some dry eschar around the edges of the medial wound and a tiny amount of slough overlying the good granulation tissue on the lateral wound. 08/09/2022: Unfortunately, she has a new wound on her left lateral lower leg, just above the ankle. It is extremely painful for her. It penetrates to the fat layer and has the same woody, fibrotic character as her previous wounds. The other wounds are both a little bit smaller with slough and some eschar around the periphery. [name], [name] ([id number]) [id number] Page 6 of 17 08/14/2022: Her right lateral ankle wound is a little bit smaller. Unfortunately, she has had an increase in her drainage and it has a purulent nature to it, similar to what was present prior to her surgical debridement. She also reports odor coming from her wounds this week. The left lateral leg wound remains extremely tender. Electronic Signature(s) Signed: 08/14/2022 9:13:24 AM By: Fredirick Maudlin MD FACS Entered By: Fredirick Maudlin on 08/14/2022 09:13:24 -------------------------------------------------------------------------------- Physical Exam Details Patient Name: Date of Service: [name], [name] 08/14/2022 8:30 A M Medical Record Number: [id number] Patient Account Number: [id number] Date of Birth/Sex: Treating RN: [birth year] (52 y.o. F) Primary Care Provider: [name] Other Clinician: Referring Provider: Treating Provider/Extender: Elyse Jarvis Weeks in Treatment: 16 Constitutional . . . . no acute distress. Respiratory Normal work of breathing on room air. Notes 08/14/2022: Her right lateral ankle wound is a little bit smaller. Unfortunately, she has had an increase in her drainage and it has a purulent nature to  it, similar to what was present prior to her surgical debridement. She also reports odor coming from her wounds this week. The left lateral leg wound remains extremely tender. Electronic Signature(s) Signed: 08/14/2022 9:13:56 AM By: Fredirick Maudlin MD  FACS Entered By: Fredirick Maudlin on 08/14/2022 09:13:56 -------------------------------------------------------------------------------- Physician Orders Details Patient Name: Date of Service: [name], [name] [name] 08/14/2022 8:30 A M Medical Record Number: [id number] Patient Account Number: [id number] Date of Birth/Sex: Treating RN: 07-25-1970 (53 y.o. 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 - Dr. Celine Ahr - room 2 Return A Anesthetic (In clinic) Topical Lidocaine 5% applied to wound bed Cellular or Tissue Based Products Other Cellular or Tissue Based Products Orders/Instructions: - Theraskin applied by plastic surgeon, leave on until further notice Bathing/ Shower/ Hygiene May shower with protection but do not get wound dressing(s) wet. Protect dressing(s) with water repellant cover (for example, large plastic bag) or a cast cover and may then take shower. Edema Control - Lymphedema / SCD / Other Avoid standing for long periods of time. Exercise regularly [name], [name] ([id number]) [id number] Page 7 of 17 Additional Orders / Instructions Follow Nutritious Diet Juven Shake 1-2 times daily. Wound Treatment Wound #17 -  Lower Leg Wound Laterality: Right, Lateral Cleanser: Soap and Water 1 x Per Week/30 Days Discharge Instructions: May shower and wash wound with dial antibacterial soap and water prior to dressing change. Cleanser: Wound Cleanser 1 x Per Week/30 Days Discharge Instructions: Cleanse the wound with wound cleanser prior to applying a clean dressing using gauze sponges, not tissue or cotton balls. Peri-Wound Care: Triamcinolone 15 (g) 1 x Per Week/30 Days Discharge Instructions: Use triamcinolone 15 (g) as directed Peri-Wound Care: Sween Lotion (Moisturizing lotion) 1 x Per Week/30 Days Discharge Instructions: Apply moisturizing lotion as directed Topical: Skintegrity Hydrogel 4 (oz) 1 x Per Week/30 Days Discharge Instructions: Apply hydrogel as directed Prim Dressing: Promogran Prisma Matrix, 4.34 (sq in) (silver collagen) 1 x Per Week/30 Days ary Discharge Instructions: Moisten collagen with saline or hydrogel Secondary Dressing: ABD Pad, 5x9 1 x Per Week/30 Days Discharge Instructions: Apply over primary dressing as directed. Secondary Dressing: CarboFLEX Odor Control Dressing, 4x4 in 1 x Per Week/30 Days Discharge Instructions: Apply over primary dressing as directed. Compression Wrap: Urgo K2 Lite, two layer compression system, regular 1 x Per Week/30 Days Discharge Instructions: Apply Urgo K2 Lite as directed (alternative to 3 layer compression). Wound #21 - Ankle Wound Laterality: Right, Medial Cleanser: Soap and Water 1 x Per Week/30 Days Discharge Instructions: May shower and wash wound with dial antibacterial soap and water prior to dressing change. Cleanser: Wound Cleanser 1 x Per Week/30 Days Discharge Instructions: Cleanse the wound with wound cleanser prior to applying a clean dressing using gauze sponges, not tissue or cotton balls. Peri-Wound Care: Triamcinolone 15 (g) 1 x Per Week/30 Days Discharge Instructions: Use triamcinolone 15 (g) as directed Peri-Wound Care: Sween Lotion  (Moisturizing lotion) 1 x Per Week/30 Days Discharge Instructions: Apply moisturizing lotion as directed Topical: Skintegrity Hydrogel 4 (oz) 1 x Per Week/30 Days Discharge Instructions: Apply hydrogel as directed Prim Dressing: Promogran Prisma Matrix, 4.34 (sq in) (silver collagen) 1 x Per Week/30 Days ary Discharge Instructions: Moisten collagen with saline or hydrogel Secondary Dressing: ABD Pad, 5x9 1 x Per Week/30 Days Discharge Instructions: Apply over primary dressing as directed. Secondary Dressing:  CarboFLEX Odor Control Dressing, 4x4 in 1 x Per Week/30 Days Discharge Instructions: Apply over primary dressing as directed. Compression Wrap: Urgo K2 Lite, two layer compression system, regular 1 x Per Week/30 Days Discharge Instructions: Apply Urgo K2 Lite as directed (alternative to 3 layer compression). Wound #24 - Ankle Wound Laterality: Left, Lateral Cleanser: Soap and Water 1 x Per Week/30 Days Discharge Instructions: May shower and wash wound with dial antibacterial soap and water prior to dressing change. Cleanser: Wound Cleanser 1 x Per Week/30 Days Discharge Instructions: Cleanse the wound with wound cleanser prior to applying a clean dressing using gauze sponges, not tissue or cotton balls. Peri-Wound Care: Triamcinolone 15 (g) 1 x Per Week/30 Days Discharge Instructions: Use triamcinolone 15 (g) as directed Peri-Wound Care: Sween Lotion (Moisturizing lotion) 1 x Per Week/30 Days Discharge Instructions: Apply moisturizing lotion as directed Prim Dressing: IODOFLEX 0.9% Cadexomer Iodine Pad 4x6 cm 1 x Per Week/30 Days ary Discharge Instructions: Apply to wound bed as instructed [name] ([id number] Page 8 of 17 Secondary Dressing: ABD Pad, 5x9 1 x Per Week/30 Days Discharge Instructions: Apply over primary dressing as directed. Secondary Dressing: CarboFLEX Odor Control Dressing, 4x4 in 1 x Per Week/30 Days Discharge  Instructions: Apply over primary dressing as directed. Compression Wrap: Urgo K2 Lite, two layer compression system, regular 1 x Per Week/30 Days Discharge Instructions: Apply Urgo K2 Lite as directed (alternative to 3 layer compression). Laboratory naerobe culture (MICRO) - PCR of nonhealing wound to right lower leg - (ICD10 L97.818 - Non- Bacteria identified in Unspecified specimen by A pressure chronic ulcer of other part of right lower leg with other specified severity) LOINC Code: [id number] Convenience Name: Anaerobic culture Patient Medications llergies: No Known Allergies A Notifications Medication Indication Start End 08/14/2022 lidocaine DOSE topical 5 % ointment - ointment topical Electronic Signature(s) Signed: 08/15/2022 4:40:02 PM By: Adline Peals Signed: 08/16/2022 8:14:24 AM By: Fredirick Maudlin MD FACS Previous Signature: 08/14/2022 9:16:27 AM Version By: Fredirick Maudlin MD FACS Entered By: Adline Peals on 08/15/2022 16:39:24 -------------------------------------------------------------------------------- Problem List Details Patient Name: Date of Service: [name], [name] 08/14/2022 8:30 A M Medical Record Number: [id number] Patient Account Number: [id number] Date of Birth/Sex: Treating RN: 1970/06/29 (52 y.o. F) Primary Care Provider: [name] Other Clinician: Referring Provider: Treating Provider/Extender: Elyse Jarvis Weeks in Treatment: 33 Active Problems ICD-10 Encounter Code Description Active Date MDM Diagnosis L97.818 Non-pressure chronic ulcer of other part of right lower leg with other specified 04/17/2021 No Yes severity L97.822 Non-pressure chronic ulcer of other part of left lower leg with fat layer 08/09/2022 No Yes exposed 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 04/17/2021 No Yes Inactive Problems ICD-10 Code Description Active Date Inactive Date L97.828 Non-pressure chronic ulcer of other part of left lower  leg with other specified severity 04/17/2021 04/17/2021 Resolved Problems DHRUTHI, FONDA ([id number]) [id number] Page 9 of 17 Electronic Signature(s) Signed: 08/14/2022 9:09:18 AM By: Fredirick Maudlin MD FACS Entered By: Fredirick Maudlin on 08/14/2022 09:09:17 -------------------------------------------------------------------------------- Progress Note Details Patient Name: Date of Service: [name], [name] 08/14/2022 8:30 A M Medical Record Number: [id number] Patient Account Number: [id number] Date of Birth/Sex: Treating RN: 29-Jan-1971 (52 y.o. F) Primary Care Provider: [name] Other Clinician: Referring Provider: Treating Provider/Extender: Elyse Jarvis Weeks in Treatment: 31 Subjective Chief Complaint Information obtained from Patient the patient is here for evaluation of her bilateral lower extremity sickle cell ulcers 04/17/2021; patient comes in for substantial wounds on the right and left  lower leg History of Present Illness (HPI) The following HPI elements were documented for the patient's wound: Location: medial and lateral ankle region on the right and left medial malleolus Quality: Patient reports experiencing a shooting pain to affected area(s). Severity: Patient states wound(s) are getting worse. Duration: right lower extremity bimalleolar ulcers have been present for approximately 2 years; the rright meedial malleolus ulcer has been there proximally 6 months Timing: Pain in wound is constant (hurts all the time) Context: The wound would happen gradually Associated Signs and Symptoms: Patient reports having increase discharge. 52 year old patient with a history of sickle cell anemia who was last seen by me with ulceration of the right lower extremity above the ankle and was referred to Dr. Leland Johns for a surgical debridement as I was unable to do anything in the office due to excruciating pain. At that stage she  was referred from the plastic surgery service to dermatology who treated her for a skin infection with doxycycline and then Levaquin and a local antibiotic ointment. I understand the patient has since developed ulceration on the left ankle both medial and lateral and was now referred back to the wound center as dermatology has finished the management. I do not have any notes from the dermatology department Old notes: 52 year old patient with a history of sickle cell anemia, pain bilateral lower extremities, right lower extremity ulcer and has a history of receiving a skin graft( Theraskin) several months ago. She has been visiting the wound center [facility] and was seen by Dr. Dellia Nims and Dr. Leland Johns. after prolonged conservator therapy between July 2016 and January 2017. She had been seen by the plastic surgeon and taken to the OR for debridement and application of Theraskin. She had 3 applications of Theraskin and was then treated with collagen. Prior to that she had a history of similar problems in 2014 and was treated conservatively. Had a reflux study done for the right lower extremity in August 2016 without reflux or DVT . Past medical history significant for sickle cell disease, anemia, leg ulcers, cholelithiasis,and has never been a smoker. Once the patient was discharged on the wound center she says within 2 or 3 weeks the problems recurred and she has been treating it conservatively. since I saw her 3 weeks ago at [facility] she has been unable to get her dressing material but has completed a course of doxycycline. 6/7/ 2017 -- lower extremity venous duplex reflux evaluation was done oo No evidence of SVT or DVT in the RLL. No venous incompetence in the RLL. No further vascular workup is indicated at this time. She was seen by Dr. [name], on 10/04/2015. She agreed with the plan of taking her to the OR for debridement and application of theraskin and would also take biopsies to  rule out pyoderma gangrenosum. Follow-up note dated May 31 received and she was status post application of Theraskin to multiple ulcers around the right ankle. Pathology did not show evidence of malignancy or pyoderma gangrenosum. She would continue to see as in the wound clinic for further care and see Dr. Leland Johns as needed. The patient brought the biopsy report and it was consistent with stasis ulcer no evidence of malignancy and the comment was that there was some adjacent neovascularization, fibrosis and patchy perivascular chronic inflammation. 11/15/2015 -- today we applied her first application of Theraskin 11/30/15; TheraSkin #2 12/13/2015 -- she is having a lot of pain locally and is here for possible application of a theraskin today. 01/16/2016 --  the patient has significant pain and has noticed despite in spite of all local care and oral pain medication. It is impossible to debride her in the office. 02/06/2016 -- I do not see any notes from Dr. Iran Planas( the patient has not made a call to the office know as she heard from them) and the only visit to recently was with her PCP Dr. Danella Penton -- I saw her on 01/16/2016 and prescribed 90 tablets of oxycodone 10 mg and did lab work and screening for HIV. the HIV was negative and hemoglobin was 6.3 with a WBC count of 14.9 and hematocrit of 17.8 with platelets of 561. 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 The bimalleolar ulcers to the right lower extremity have been present for approximately 2 years, the left medial malleolus ulcer has been present approximately 6 months. She has followed with Dr.Thimmappa in the past and has had a total of 3 applications of Theraskin (01/2015, 09/2015, 06/17/16). She has also followed with Dr. Con Memos here in the clinic and has received 2 applications of TheraSkin (11/10/15, 11/30/15). The  patient does experience chronic, and is not amenable to debridement. 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 She is not currently on any antibiotic therapy and has not been treated with any recently. 07/17/2016 -- was seen by Dr. Iran Planas of plastic surgery who saw her 2 weeks postop application of Theraskin #3. She had removed her dressing and asked her to apply silver alginate on alternate days and follow-up back with the wound center. Future debridements and application of skin substitute would have to be done in the hospital due to her high risk for anesthesia. READMISSION 04/17/2021 Patient is now a 52 year old woman that we have had in this clinic for a prolonged period of time and 2016-2017 and then again for 2 visits in February 2018. At that point she had wounds on the right lower leg predominantly medial. She had also been seen by plastic surgery Dr. Leland Johns who I believe took her to the [facility], [place] ([id number]) [id number] Page 10 of 17 OR for operative debridement and application of TheraSkin in 2017. After she left our clinic she was followed for a very prolonged period of time in the wound care center in [facility] who then referred her ultimately to [facility] where she was seen by Dr. Vernona Rieger. Again taken her to the OR for skin grafting which apparently did not take. She had multiple other attempts at dressings although I have not really looked over all of these notes in great detail. She has not been seen in a wound care center in about a year. She states over the last year in addition to her right lower leg she has developed wounds on the left lower leg quite extensive. She is using Xeroform to all of these wounds without really any improvement. She also has Medicaid which does not cover wound products. The patient has had vascular work-ups in the past including most recently on 03/28/2021 showing biphasic  waveforms on the right triphasic at the PTA and biphasic at the dorsalis pedis on the left. She was unable to tolerate any degree of compression to do ABIs. Unfortunately TBI's were also not done. She had venous reflux studies done in 2017. This did not show any evidence of a DVT or SVT and no venous incompetence was noted  in the right leg at the time this was the only side with the wound As noted I did not look all over her old records. She apparently had a course of [facility] and [facility] although I am not sure what the indication would have been. In any case she developed seizures and terminated treatment earlier. She is generally much more disabled than when we last saw her in clinic. She can no longer walk pretty much wheelchair-bound because predominantly of pain in the left hip. 04/24/2021; the patient tolerated the wraps we put on. We used Santyl and Hydrofera Blue under compression. I brought her back for a nurse visit for a change in dressing. With Medicaid we will have a hard time getting anything paid for and hence the need for compression. She arrives in clinic with all the wounds looking somewhat better in terms of surface 12/20; circumferential wound on the right from the lateral to the medial. She has open areas on the left medial and left lateral x2 on all of this with the same surface. This does not look completely healthy although she does have some epithelialization. She is not complaining of a lot of pain which is unusual for her sickle ulcers. I have not looked over her extensive records from [facility]. She had recent arterial studies and has a history of venous reflux studies I will need to look these over although I do not believe she has significant arterial disease 2023 05/22/2021; patient's wound areas measure slightly smaller. Still a lot of drainage coming from the right we have been using Hydrofera Blue and Santyl with some improvement in the wound surfaces. She tells me she will  be getting transfused later in the week for her underlying sickle cell anemia I have looked over her recent arterial studies which were done in the fall. This was in November and showed biphasic and triphasic waveforms but she could not tolerate ABIs because of pressure and unfortunately TBI's were not done. She has not had recent venous reflux studies that I can see 1/10; not much change about the same surface area. This has a yellowish surface to it very gritty. We have been using Santyl and Hydrofera Blue for a prolonged period. Culture I did last week showed methicillin sensitive staph aureus "rare". Our intake nurse reports greenish drainage which may be the Hydrofera Blue itself 1/17; wounds are continue to measure smaller although I am not sure about the accuracy here. Especially the areas on the right are covered in what looks to be a nonviable surface although she does have some epithelialization. Similarly she has areas on the left medial and left lateral ankle area which appear to have a better surface and perhaps are slightly smaller. We have been using Santyl and Hydrofera Blue. She cannot tolerate mechanical debridements She went for her reflux studies which showed significant reflux at the greater saphenous vein at the saphenofemoral junction as well as the greater saphenous vein in the proximal calf on the left she had reflux in the thigh and the common femoral vein and supra vein Fishel vein reflux in the greater saphenous vein. I will have vein and vascular look at this. My thoughts have been that these are likely sickle wounds. I looked through her old records from [facility] wound care center and then when she graduated to [facility] wound care center where she saw Dr. Zigmund Daniel and Dr. Vernona Rieger. Although I can see she had reflux studies done I do not see  that she actually saw a vein and vascular. I went over the fact that she had operative debridements and actual skin grafting  that did not take. I do not think these wounds have ever really progressed towards healing 1/31;Substantial wounds on the right ankle area. Hyper granulated very gritty adherent debris on the surface. 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 On the left she has no DVT or SVT deep vein reflux in the common femoral vein. Superficial vein reflux in the greater saphenous vein on although the vein diameter is not really all that large. I do not think there is anything that can be done with these although I am going to send her for consultation to vein and vascular. 2/7; Wound exam; substantial wound area on the right posterior ankle area and areas on the left medial ankle and left lateral ankle. I was able to debride the left medial ankle last week fairly aggressively and it is back this week to a completely nonviable surface She will see vascular surgery this Friday and I would like them to review the venous studies and also any comments on her arterial status. If they do not see an issue here I am going to refer her to plastic surgery for an operative debridement perhaps intraoperative ACell or Integra. Eventually she will require a deep tissue culture again 2/14; substantial wound area on the right posterior ankle, medial ankle. We have been using silver alginate The patient was seen by vein and vascular she had both venous reflux studies and arterial studies. 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 She had no evidence of superficial venous thrombosis. She did have  superficial vein reflux at the saphenofemoral junction and the greater saphenous vein. On the left no evidence of a DVT no evidence of superficial venous throat thrombosis she did have deep vein reflux in the common femoral vein and superficial vein reflux in the greater saphenous vein but these were not felt to be amenable to ablation. In terms of arterial studies she had triphasic and wife biphasic wave waveforms bilaterally not felt to have a significant arterial issue. I do not get the feeling that they felt that any part of her nonhealing wounds were related to either arterial or venous issues. They did note that she had venous reflux at the right at the [facility] and [facility]. And also on the left there were reflux in the deep system at the common femoral vein and greater saphenous vein in the proximal thigh. Nothing amenable to ablation. 2/20; she is making some decent progress on the right where there is nice skin between the 2 open areas on the right ankle. The surfaces here do not look viable yet there is some surrounding epithelialization. She still has a small area on the left medial ankle area. Hyper-granulated Jody's away always 2/28 patient has an appointment with plastic surgery on 3/8. We will see her back on 3/9. She may have to call us to get the area redressed. We've been using Santyl under silver alginate.  We made a nice improvement on the left medial ankle. The larger wounds on the right also looks somewhat better in terms of epithelialization although I think they could benefit from an aggressive debridement if plastic surgery would be willing to do that. Perhaps placement of Integra or a cell 07/26/2021: She saw Dr. Claudia Desanctis yesterday. He raised the question as to whether or not this might be pyoderma and wanted to wait until that question was answered by dermatology before proceeding with any sort of operative debridement. We have continue to use Santyl under silver alginate with Kerlix and  Coban wraps. Overall, her wounds appear to be continuing to contract and epithelialize, with some granulation tissue present. There continues to be some slough on all wound surfaces. 08/09/2021: She has not been able to get an appointment with dermatology because apparently the offices in [place] do not accept Medicaid. She is looking into whether or not she can be seen at the main [facility] dermatology clinic. This is necessary because plastic surgery is concerned that her wounds might represent pyoderma and they did not want to do any procedure until that was clarified. We have been using Santyl under [place], Legrand Rams ([id number]) [id number] Page 11 of 17 silver alginate with Kerlix and Coban wraps. Today, there was a greater amount of drainage on her dressings with a slight green discoloration and significant odor. Despite this, her wounds continue to contract and epithelialize. There is pale granulation tissue present and actually, on the left medial ankle, the granulation tissue is a bit hypertrophic. 08/16/2021: Last week, I took a culture and this grew back rare methicillin-resistant Staph aureus and rare corynebacterium. The MRSA was sensitive to gentamicin which we began applying topically on an empiric basis. This week, her wounds are a bit smaller and the drainage and odor are less. Her primary care provider is working on assisting the patient with a dermatology evaluation. She has been in silver alginate over the gentamicin that was started last week along with Kerlix and Coban wraps. 08/23/2021: Because she has Medicaid, we have been unable to get her into see any dermatologist in the [place] to rule out pyoderma gangrenosum, which was a requirement from plastic surgery prior to any sort of debridement and grafting. Despite this, however, all of her wounds continue to get smaller. The wound on her left medial ankle is nearly closed. There  is no odor from the wounds, although she still accumulates a modest amount of drainage on her dressings. 08/30/2021: The lateral right ankle wound and the medial left ankle wound are a bit smaller today. The medial right ankle wound is about the same size. They are less tender. We have still been unable to get her into dermatology. 09/06/2021: All of the wounds are about the same size today. She continues to endorse minimal pain. I communicated with Dr. Claudia Desanctis in plastic surgery regarding our issues getting a dermatology appointment; he was out of town but indicated that he would look into perhaps performing the biopsy in his office and will have his office contact her. 09/14/2021: The patient has an appointment in dermatology, but it is not until October. Her wounds are roughly the same; she continues to have very thick purulent-looking drainage on her dressings. 09/20/2021: The left medial wound is nearly closed and just has a bit of accumulated eschar on the surface. The right medial and lateral ankle wounds are perhaps a little bit smaller. They continue to have a very pale  surface with accumulation of thin slough. 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 I did not think [employer] was indicated based on this. She is getting topical mupirocin with Prisma silver collagen. 10/04/2021: The patient was not seen in clinic last week due to childcare coverage issues. In the interim, the left medial leg wound has closed. The right sided leg wounds are smaller. There is more granulation tissue coming through, particularly on the lateral wound. The surface remains somewhat gritty. We have been applying topical mupirocin and Prisma silver collagen. 10/11/2021: The left medial leg wound remains closed. She does complain of some anesthetic sensation to the area. Both of the right-sided leg wounds are smaller but still have accumulated slough. 10/18/2021: Both right-sided leg wounds are  minimally smaller this week. She still continues to accumulate slough and has thick drainage on her dressings. 10/23/2021: Both wounds continue to contract. There is still slough buildup. She has been approved for a keratin-based skin substitute trial product but it will not be available until next week. 10/30/2021: The wounds are about the same to perhaps slightly smaller. There is still continued slough buildup. Unfortunately, the rep for the keratin based product did not show up today and did not answer his phone when called. 11/08/2021: The wounds are little bit smaller today. She continues to have thick drainage but the surfaces are relatively clean with just a little bit of slough accumulation. She reported to me today that she is unable to completely flex her left ankle and on examination it seems this is potentially related to scar tissue from her wounds. We do have the ProgenaMatrix trial product available for her today. 11/15/2021: Both wounds are smaller today. There is some slough accumulation on the surfaces, but the medial wound, in particular looks like it is filling in and is less deep. She did hear from physical therapy and she is going to start working with them on July 11. She is here for her second application of the trial skin substitute, ProgenaMatrix. 11/22/2021: Both wounds continue to contract, the medial more dramatically than the lateral. Both wounds have a layer of slough on the surface, but underneath this, the gritty fibrous tissue has a little bit more of a pink cast to it rather than being as pale as it has been. 11/29/2021: The wounds are roughly the same size this week, perhaps a millimeter or 2 smaller. The medial wound has filled in and is nearly flush with the surrounding skin surface. She continues to have a lot of slough accumulation on both surfaces. 12/06/2021: No significant change to her wounds, but she has a new opening on her dorsal foot, just distal to the right  lateral ankle wound. The area on her left medial ankle that reopened looks a little bit larger today. She has quite a bit of pain associated with the new wound. 12/12/2021: Her wounds look about the same but the new opening on her right lateral dorsal foot is a little bit bigger. She continues to have a fair amount of pain with this wound. 12/26/2021: The left medial ankle wound is tiny and superficial. She has 2 areas of crusting on her left lateral ankle, however, that appear to be threatening to open again. Her right medial ankle wound is a little bit smaller today but still continues to accumulate thick rubbery slough. The new dorsal foot wound is exquisitely painful but there is no odor or purulent drainage. No erythema or induration. The  right lateral ankle wound looks about the same today, again with thick rubbery slough. 01/03/2022: The left medial ankle wound has closed again. Both right ankle wounds appear to be about the same size with thick rubbery slough. The dorsal foot wound on the right continues to be quite painful and she stated that she did not want any debridement of that site today. 01/10/2022: No real change to any of her wounds. She continues to accumulate thick slough. The dorsal foot wound has merged with the lateral malleolar wound. She is experiencing significant pain in the dorsal foot portion of the ulcer. 01/16/2022: Absolutely no change or progress in her wounds. 01/24/2022: Her wounds are unchanged. She continues to build up slough and the wounds on her dorsal right foot are still exquisitely tender. 01/31/2022: The wounds actually measure a little bit narrower today. They still have thick slough on the surface but the underlying tissue seems a little less fibrotic. We changed to Iodosorb last week. 02/07/2022: Wounds continue to slowly epithelialize around the parameters. She has less pain today. She still accumulates a fairly substantial layer of slough. 02/15/2022: No change  to the wounds overall. The measured a little bit larger today per the intake nurse. She continues to have substantial slough accumulation and her pain is a little bit worse. 02/21/2022. No change at all to her wounds. I do not see that plastic surgery has received her referral yet. 02/28/2022: The wounds remain unchanged. They are dry and fibrotic with accumulation of slough and eschar. She did receive an appointment to see plastic surgery on October 23, but the office called her back and indicated they needed to reschedule and that the next available appointment was not until December. The patient became angry and decided she did not want to see this plastics group. 10/19; the patient sees Dr. Lovett Calender again next week. She is using Medihoney as the primary dressing changing this herself. [name], [name] ([id number]) [id number] Page 12 of 17 03/21/2022: She saw Dr. Jaclynn Guarneri at the wound care center at [facility]. Dr. Jaclynn Guarneri was also unable to debride her, secondary to pain and is planning to take her to the operating room for operative debridement and potential skin substitute placement. Her wounds are unchanged with thick yellow slough and a fibrotic base. She says they are too painful to be debrided. In addition, yesterday her hemoglobin was 4 and she received 2 units packed red blood cell transfusion. 04/04/2022: Her operation at [facility] is scheduled for December 15. She continues to use Medihoney on her wounds. They are a little bit less painful today and she is willing to entertain the possibility of debridement. The wounds measured a little bit larger in all dimensions today but the layer of slough is not as thick as usual. 04/18/2022: Her wounds actually measured a little bit smaller today and the extension onto the dorsal foot from the lateral wound has healed. They are less tender and there is significantly less slough present as compared  to prior visits. 05/09/2022: She had to reschedule her surgery due to lack of childcare. It is now planned for January 5. The wounds are basically unchanged, but she had a lot more drainage, which was thick and somewhat purulent in appearance. No significant odor. 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 They are quite painful today and she does not want to have debridement performed. 05/30/2022: The patient underwent surgical debridement and placement of TheraSkin to her wounds on January 5.  She has been doing well and does not endorse any pain. The TheraSkin is intact and looks beautiful. It is sutured in place. There is no exudate or significant drainage. 06/04/2022: Her TheraSkin remains intact and I am starting to see granulation tissue emerging underneath the surface. No concern for infection. 06/12/2022: The TheraSkin is intact and there is more granulation tissue emerging. It is starting to look a little bit dry, however. No malodor or purulent drainage. 06/19/2022: The TheraSkin continues to look good. The edges are a bit dry, but the central portion of each of her wounds is showing a nice pink color. 06/27/2022: The TheraSkin on the lateral wound remains almost completely intact. There is nice pink tissue peaking through the mesh of the TheraSkin. On the medial leg, it is starting to breakdown a little bit, but most of the TheraSkin remains intact here, as well. Both wounds measured smaller today. 07/03/2022: The wounds are fairly dry around the perimeter today. Some of the suture is hanging loose. 07/12/2022: Both wounds are measuring smaller today. The medial ulcer is getting a bit too dry. The lateral ulcer continues to have nice buds of granulation tissue emerging. 07/26/2022: The moisture balance on both wounds is much better today. For the first time since I began seeing her, I see actual beefy red granulation tissue on the lateral ankle wound. There are more buds of deep pink  granulation tissue emerging on the medial ankle. There is some dry eschar around the edges of the medial wound and a tiny amount of slough overlying the good granulation tissue on the lateral wound. 08/09/2022: Unfortunately, she has a new wound on her left lateral lower leg, just above the ankle. It is extremely painful for her. It penetrates to the fat layer and has the same woody, fibrotic character as her previous wounds. The other wounds are both a little bit smaller with slough and some eschar around the periphery. 08/14/2022: Her right lateral ankle wound is a little bit smaller. Unfortunately, she has had an increase in her drainage and it has a purulent nature to it, similar to what was present prior to her surgical debridement. She also reports odor coming from her wounds this week. The left lateral leg wound remains extremely tender. Patient History Information obtained from Patient. Family History Diabetes - Mother, Lung Disease - Mother, No family history of Cancer, Heart Disease, Hereditary Spherocytosis, Hypertension, Kidney Disease, Seizures, Stroke, Thyroid Problems, Tuberculosis. Social History Never smoker, Marital Status - Married, Alcohol Use - Never, Drug Use - No History, Caffeine Use - Daily. Medical History Eyes Denies history of Cataracts, Glaucoma, Optic Neuritis Ear/Nose/Mouth/Throat Denies history of Chronic sinus problems/congestion, Middle ear problems Hematologic/Lymphatic Patient has history of Anemia, Sickle Cell Disease Denies history of Hemophilia, Human Immunodeficiency Virus, Lymphedema Respiratory Denies history of Aspiration, Asthma, Chronic Obstructive Pulmonary Disease (COPD), Pneumothorax, Sleep Apnea, Tuberculosis Cardiovascular Denies history of Angina, Arrhythmia, Congestive Heart Failure, Coronary Artery Disease, Deep Vein Thrombosis, Hypertension, Hypotension, Myocardial Infarction, Peripheral Arterial Disease, Peripheral Venous Disease,  Phlebitis, Vasculitis Gastrointestinal Denies history of Cirrhosis , Colitis, Crohnoos, Hepatitis A, Hepatitis B, Hepatitis C Endocrine Denies history of Type I Diabetes, Type II Diabetes Genitourinary Denies history of End Stage Renal Disease Immunological Denies history of Lupus Erythematosus, Raynaudoos, Scleroderma Integumentary (Skin) Denies history of History of Burn Musculoskeletal Denies history of Gout, Rheumatoid Arthritis, Osteoarthritis, Osteomyelitis Neurologic Patient has history of Neuropathy - right foot intermittant Denies history of Dementia, Quadriplegia, Paraplegia, Seizure Disorder Oncologic Denies history of  Received Chemotherapy, Received Radiation Psychiatric Denies history of Anorexia/bulimia, Confinement Anxiety Hospitalization/Surgery History - c section x2. - left breast lumpectomy. - iandD right ankle with theraskin. [name], [name] ([id number]) [id number] Page 13 of 17 Medical A Surgical History Notes nd Constitutional Symptoms (General Health) H/O miscarriage Cardiovascular bradycardia Gastrointestinal cholilithiasis Objective Constitutional no acute distress. Vitals Time Taken: 8:33 AM, Height: 67 in, Weight: 134 lbs, BMI: 21, Temperature: 98.0 F, Pulse: 66 bpm, Respiratory Rate: 20 breaths/min, Blood Pressure: 122/73 mmHg. Respiratory Normal work of breathing on room air. General Notes: 08/14/2022: Her right lateral ankle wound is a little bit smaller. Unfortunately, she has had an increase in her drainage and it has a purulent nature to it, similar to what was present prior to her surgical debridement. She also reports odor coming from her wounds this week. The left lateral leg wound remains extremely tender. Integumentary (Hair, Skin) Wound #17 status is Open. Original cause of wound was Gradually Appeared. The date acquired was: 10/05/2012. The wound has been in treatment 69 weeks. The wound is located on the  Right,Lateral Lower Leg. The wound measures 6.3cm length x 5.3cm width x 0.2cm depth; 26.224cm^2 area and 5.245cm^3 volume. There is Fat Layer (Subcutaneous Tissue) exposed. There is no tunneling or undermining noted. There is a medium amount of purulent drainage noted. The wound margin is distinct with the outline attached to the wound base. There is medium (34-66%) pink granulation within the wound bed. There is a medium (34-66%) amount of necrotic tissue within the wound bed including Adherent Slough. The periwound skin appearance had no abnormalities noted for moisture. The periwound skin appearance exhibited: Scarring. The periwound skin appearance did not exhibit: Ecchymosis. Periwound temperature was noted as No Abnormality. The periwound has tenderness on palpation. Wound #21 status is Open. Original cause of wound was Gradually Appeared. The date acquired was: 06/26/2021. The wound has been in treatment 59 weeks. The wound is located on the Right,Medial Ankle. The wound measures 7.5cm length x 3.4cm width x 0.1cm depth; 20.028cm^2 area and 2.003cm^3 volume. There is Fat Layer (Subcutaneous Tissue) exposed. There is no tunneling or undermining noted. There is a medium amount of purulent drainage noted. The wound margin is distinct with the outline attached to the wound base. There is medium (34-66%) pink granulation within the wound bed. There is a medium (34- 66%) amount of necrotic tissue within the wound bed including Adherent Slough. The periwound skin appearance had no abnormalities noted for moisture. The periwound skin appearance had no abnormalities noted for color. The periwound skin appearance exhibited: Scarring. Periwound temperature was noted as No Abnormality. The periwound has tenderness on palpation. Wound #24 status is Open. Original cause of wound was Gradually Appeared. The date acquired was: 08/06/2022. The wound is located on the Left,Lateral Ankle. The wound measures 1.4cm  length x 1.3cm width x 0.1cm depth; 1.429cm^2 area and 0.143cm^3 volume. There is Fat Layer (Subcutaneous Tissue) exposed. There is no tunneling or undermining noted. There is a medium amount of serosanguineous drainage noted. The wound margin is distinct with the outline attached to the wound base. There is small (1-33%) pink granulation within the wound bed. There is a large (67-100%) amount of necrotic tissue within the wound bed including Adherent Slough. The periwound skin appearance had no abnormalities noted for texture. The periwound skin appearance had no abnormalities noted for moisture. The periwound skin appearance had no abnormalities noted for color. Periwound temperature was noted as No Abnormality. The periwound has tenderness on palpation.  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 There was a Selective/Open Wound Non-Viable Tissue Debridement with a total area of 30 sq cm performed by Fredirick Maudlin, MD. With the following instrument(s): Curette to remove Non-Viable tissue/material. Material removed includes [facility] after achieving pain control using Lidocaine 5% topical ointment. 1 specimen was taken by a Tissue Culture and sent to the lab per facility protocol. A time out was conducted at 08:52, prior to the start of the procedure. A Minimum amount of bleeding was controlled with Pressure. The procedure was tolerated well. Post Debridement Measurements: 6.3cm length x 5.3cm width x 0.2cm depth; 5.245cm^3 volume. Character of Wound/Ulcer Post Debridement is improved. Post procedure Diagnosis Wound #17: Same as Pre-Procedure General Notes: scribed for Dr. Celine Ahr  by Adline Peals, RN. [place], Legrand Rams ([id number]) [id number] Page 14 of 17 Pre-procedure diagnosis of Wound #17 is a Sickle Cell Lesion located on the Right,Lateral Lower Leg . There was a Double Layer Compression Therapy Procedure by Adline Peals, RN. Post procedure Diagnosis Wound #17: Same as Pre-Procedure Wound #21 Pre-procedure diagnosis of Wound #21 is a Sickle Cell Lesion located on the Right,Medial Ankle .Severity of Tissue Pre Debridement is: Fat layer exposed. There was a Selective/Open Wound Non-Viable Tissue Debridement with a total area of 14 sq cm performed by Fredirick Maudlin, MD. With the following instrument(s): Curette to remove Non-Viable tissue/material. Material removed includes Eschar and Slough and after achieving pain control using Lidocaine 5% topical ointment. No specimens were taken. A time out was conducted at 08:52, prior to the start of the procedure. A Minimum amount of bleeding was controlled with Pressure. The procedure was tolerated well. Post Debridement Measurements: 7.5cm length x 3.4cm width x 0.1cm depth; 2.003cm^3 volume. Character of Wound/Ulcer Post Debridement is improved. Severity of Tissue Post Debridement is: Fat layer exposed. Post procedure Diagnosis Wound #21: Same as Pre-Procedure General Notes: scribed for Dr. Celine Ahr by Adline Peals, RN. Wound #24 Pre-procedure diagnosis of Wound #24 is a Sickle Cell Lesion located on the Left,Lateral Ankle . There was a Double Layer Compression Therapy Procedure by Adline Peals, RN. Post procedure Diagnosis Wound #24: Same as Pre-Procedure Plan Follow-up Appointments: Return Appointment in 1 week. - Dr. Celine Ahr - room 2 Anesthetic: (In clinic) Topical Lidocaine 5% applied to wound bed Cellular or Tissue Based Products: Other Cellular or Tissue Based Products Orders/Instructions: - Theraskin applied by Psychiatric nurse, leave on until further notice Bathing/  Shower/ Hygiene: May shower with protection but do not get wound dressing(s) wet. Protect dressing(s) with water repellant cover (for example, large plastic bag) or a cast cover and may then take shower. Edema Control - Lymphedema / SCD / Other: Avoid standing for long periods of time. Exercise regularly Additional Orders / Instructions: Follow Nutritious Diet Juven Shake 1-2 times daily. Laboratory ordered were: Anaerobic culture - PCR of nonhealing wound to right lower leg The following medication(s) was prescribed: lidocaine topical 5 % ointment ointment topical was prescribed at facility WOUND #17: - Lower Leg Wound Laterality: Right, Lateral Cleanser: Soap and Water 1 x Per Week/30 Days Discharge Instructions: May shower and wash wound with dial antibacterial soap and water prior to dressing change. Cleanser: Wound Cleanser 1 x Per Week/30 Days  Discharge Instructions: Cleanse the wound with wound cleanser prior to applying a clean dressing using gauze sponges, not tissue or cotton balls. Peri-Wound Care: Triamcinolone 15 (g) 1 x Per Week/30 Days Discharge Instructions: Use triamcinolone 15 (g) as directed Peri-Wound Care: Sween Lotion (Moisturizing lotion) 1 x Per Week/30 Days Discharge Instructions: Apply moisturizing lotion as directed Topical: Skintegrity Hydrogel 4 (oz) 1 x Per Week/30 Days Discharge Instructions: Apply hydrogel as directed Prim Dressing: Promogran Prisma Matrix, 4.34 (sq in) (silver collagen) 1 x Per Week/30 Days ary Discharge Instructions: Moisten collagen with saline or hydrogel Secondary Dressing: ABD Pad, 5x9 1 x Per Week/30 Days Discharge Instructions: Apply over primary dressing as directed. Secondary Dressing: CarboFLEX Odor Control Dressing, 4x4 in 1 x Per Week/30 Days Discharge Instructions: Apply over primary dressing as directed. Com pression Wrap: Urgo K2 Lite, two layer compression system, regular 1 x Per Week/30 Days Discharge Instructions:  Apply Urgo K2 Lite as directed (alternative to 3 layer compression). WOUND #21: - Ankle Wound Laterality: Right, Medial Cleanser: Soap and Water 1 x Per Week/30 Days Discharge Instructions: May shower and wash wound with dial antibacterial soap and water prior to dressing change. Cleanser: Wound Cleanser 1 x Per Week/30 Days Discharge Instructions: Cleanse the wound with wound cleanser prior to applying a clean dressing using gauze sponges, not tissue or cotton balls. Peri-Wound Care: Triamcinolone 15 (g) 1 x Per Week/30 Days Discharge Instructions: Use triamcinolone 15 (g) as directed Peri-Wound Care: Sween Lotion (Moisturizing lotion) 1 x Per Week/30 Days Discharge Instructions: Apply moisturizing lotion as directed Topical: Skintegrity Hydrogel 4 (oz) 1 x Per Week/30 Days Discharge Instructions: Apply hydrogel as directed Prim Dressing: Promogran Prisma Matrix, 4.34 (sq in) (silver collagen) 1 x Per Week/30 Days ary Discharge Instructions: Moisten collagen with saline or hydrogel Secondary Dressing: ABD Pad, 5x9 1 x Per Week/30 Days Discharge Instructions: Apply over primary dressing as directed. Secondary Dressing: CarboFLEX Odor Control Dressing, 4x4 in 1 x Per Week/30 Days Discharge Instructions: Apply over primary dressing as directed. Com pression Wrap: Urgo K2 Lite, two layer compression system, regular 1 x Per Week/30 Days Discharge Instructions: Apply Urgo K2 Lite as directed (alternative to 3 layer compression). WOUND #24: - Ankle Wound Laterality: Left, Lateral Cleanser: Soap and Water 1 x Per Week/30 Days Discharge Instructions: May shower and wash wound with dial antibacterial soap and water prior to dressing change. Cleanser: Wound Cleanser 1 x Per Week/30 Days Discharge Instructions: Cleanse the wound with wound cleanser prior to applying a clean dressing using gauze sponges, not tissue or cotton balls. Peri-Wound Care: Triamcinolone 15 (g) 1 x Per Week/30 Days Discharge  Instructions: Use triamcinolone 15 (g) as directed [name] ([id number] Page 15 of 17 Peri-Wound Care: Sween Lotion (Moisturizing lotion) 1 x Per Week/30 Days Discharge Instructions: Apply moisturizing lotion as directed Prim Dressing: IODOFLEX 0.9% Cadexomer Iodine Pad 4x6 cm 1 x Per Week/30 Days ary Discharge Instructions: Apply to wound bed as instructed Secondary Dressing: ABD Pad, 5x9 1 x Per Week/30 Days Discharge Instructions: Apply over primary dressing as directed. Secondary Dressing: CarboFLEX Odor Control Dressing, 4x4 in 1 x Per Week/30 Days Discharge Instructions: Apply over primary dressing as directed. Com pression Wrap: Urgo K2 Lite, two layer compression system, regular 1 x Per Week/30 Days Discharge Instructions: Apply Urgo K2 Lite as directed (alternative to 3 layer compression). 08/14/2022: Her right lateral ankle wound is a little bit smaller. Unfortunately, she has had an increase in her drainage and it has a purulent  nature to it, similar to what was present prior to her surgical debridement. She also reports odor coming from her wounds this week. The left lateral leg wound remains extremely tender. I used a curette to debride slough off of the medial and lateral right leg wounds, to the extent the patient would permit. I then took a culture due to the increased drainage and odor. She would not permit me to debride anything off of the left lateral ankle wound secondary to pain. For now, we will continue the Prisma silver collagen and 3 layer compression. We will add a layer of CarboFlex to try and mitigate the odor. Once her culture data return, I will make appropriate therapeutic interventions as indicated. Follow-up in 1 week. Electronic Signature(s) Signed: 08/15/2022 4:40:02 PM By: Adline Peals Signed: 08/16/2022 8:14:24 AM By: Fredirick Maudlin MD FACS Previous Signature: 08/14/2022 9:15:15 AM Version By: Fredirick Maudlin MD FACS Entered By: Adline Peals on 08/15/2022 16:39:46 -------------------------------------------------------------------------------- HxROS Details Patient Name: Date of Service: [name], [name] 08/14/2022 8:30 A M Medical Record Number: [id number] Patient Account Number: [id number] Date of Birth/Sex: Treating RN: March 30, 1971 (52 y.o. F) Primary Care Provider: [name] Other Clinician: Referring Provider: Treating Provider/Extender: Elyse Jarvis Weeks in Treatment: 63 Information Obtained From Patient Constitutional Symptoms (General Health) Medical History: Past Medical History Notes: H/O miscarriage Eyes Medical History: Negative for: Cataracts; Glaucoma; Optic Neuritis Ear/Nose/Mouth/Throat Medical History: Negative for: Chronic sinus problems/congestion; Middle ear problems Hematologic/Lymphatic Medical History: Positive for: Anemia; Sickle Cell Disease Negative for: Hemophilia; Human Immunodeficiency Virus; Lymphedema Respiratory Medical History: Negative for: Aspiration; Asthma; Chronic Obstructive Pulmonary Disease (COPD); Pneumothorax; Sleep Apnea; Tuberculosis Cardiovascular Medical History: Negative for: Angina; Arrhythmia; Congestive Heart Failure; Coronary Artery Disease; Deep Vein Thrombosis; Hypertension; Hypotension; Myocardial Infarction; Peripheral Arterial Disease; Peripheral Venous Disease; Phlebitis; Vasculitis Past Medical History Notes: bradycardia [name], [name] ([id number]) [id number] Page 16 of 17 Gastrointestinal Medical History: Negative for: Cirrhosis ; Colitis; Crohns; Hepatitis A; Hepatitis B; Hepatitis C Past Medical History Notes: cholilithiasis Endocrine Medical History: Negative for: Type I Diabetes; Type II Diabetes Genitourinary Medical History: Negative for: End Stage Renal Disease Immunological Medical History: Negative for: Lupus Erythematosus; Raynauds;  Scleroderma Integumentary (Skin) Medical History: Negative for: History of Burn Musculoskeletal Medical History: Negative for: Gout; Rheumatoid Arthritis; Osteoarthritis; Osteomyelitis Neurologic Medical History: Positive for: Neuropathy - right foot intermittant Negative for: Dementia; Quadriplegia; Paraplegia; Seizure Disorder Oncologic Medical History: Negative for: Received Chemotherapy; Received Radiation Psychiatric Medical History: Negative for: Anorexia/bulimia; Confinement Anxiety Immunizations Pneumococcal Vaccine: Received Pneumococcal Vaccination: No Implantable Devices None Hospitalization / Surgery History Type of Hospitalization/Surgery c section x2 left breast lumpectomy iandD right ankle with theraskin Family and Social History Cancer: No; Diabetes: Yes - Mother; Heart Disease: No; Hereditary Spherocytosis: No; Hypertension: No; Kidney Disease: No; Lung Disease: Yes - Mother; Seizures: No; Stroke: No; Thyroid Problems: No; Tuberculosis: No; Never smoker; Marital Status - Married; Alcohol Use: Never; Drug Use: No History; Caffeine Use: Daily; Financial Concerns: No; Food, Clothing or Shelter Needs: No; Support System Lacking: No; Transportation Concerns: No Electronic Signature(s) Signed: 08/14/2022 9:16:27 AM By: Fredirick Maudlin MD FACS Entered By: Fredirick Maudlin on 08/14/2022 09:13:34 SuperBill Details -------------------------------------------------------------------------------- [name] ([id number]) [id number] Page 17 of 17 Patient Name: Date of Service: [name] [name] 08/14/2022 Medical Record Number: [id number] Patient Account Number: [id number] Date of Birth/Sex: Treating RN: November 05, 1970 (52 y.o. 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 Facility Procedures CPT4 Code Description Modifier Quantity [id number] - DEBRIDE WOUND 1ST 20 SQ CM OR < 1 ICD-10 Diagnosis Description L97.818 Non-pressure chronic ulcer of other part of right lower leg with other specified severity [id number] - DEBRIDE WOUND EA ADDL 20 SQ CM 2 ICD-10 Diagnosis Description L97.818 Non-pressure chronic ulcer of other part of right lower leg with other specified severity Physician Procedures Quantity CPT4 Code Description Modifier [id number] - WC PHYS LEVEL 3 - EST PT 25 1 ICD-10 Diagnosis Description L97.818 Non-pressure chronic ulcer of other part of right lower leg with other specified severity L97.822 Non-pressure chronic ulcer of other part of left lower leg with fat layer exposed 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

## 2022-08-21 ENCOUNTER — Encounter (HOSPITAL_BASED_OUTPATIENT_CLINIC_OR_DEPARTMENT_OTHER): Payer: Medicaid Other | Attending: General Surgery | Admitting: General Surgery

## 2022-08-21 DIAGNOSIS — L97818 Non-pressure chronic ulcer of other part of right lower leg with other specified severity: Secondary | ICD-10-CM | POA: Diagnosis present

## 2022-08-21 DIAGNOSIS — L97822 Non-pressure chronic ulcer of other part of left lower leg with fat layer exposed: Secondary | ICD-10-CM | POA: Insufficient documentation

## 2022-08-21 DIAGNOSIS — D571 Sickle-cell disease without crisis: Secondary | ICD-10-CM | POA: Insufficient documentation

## 2022-08-21 NOTE — Progress Notes (Signed)
PEREL, ALPHIN ([id number]) [id number] Page 1 of 10 Visit Report for 08/21/2022 Arrival Information Details Patient Name: Date of Service: [name] 08/21/2022 9:15 A M Medical Record Number: [id number] Patient Account Number: [id number] Date of Birth/Sex: Treating RN: [birth year] (52 y.o. F) Primary Care Lavan Imes: [name] Other Clinician: Referring Alazar Cherian: Treating Zander Ingham/Extender: Elyse Jarvis Weeks in Treatment: 85 Visit Information History Since Last Visit All ordered tests and consults were completed: No Patient Arrived: [name] Added or deleted any medications: No Arrival Time: 08:51 Any new allergies or adverse reactions: No Accompanied By: self Had a fall or experienced change in No Transfer Assistance: [name] activities of daily living that may affect Patient Identification Verified: Yes risk of falls: Secondary Verification Process Completed: Yes Signs or symptoms of abuse/neglect since last visito No Patient Requires Transmission-Based Precautions: No Hospitalized since last visit: No Patient Has Alerts: No Implantable device outside of the clinic excluding No cellular tissue based products placed in the center since last visit: Pain Present Now: Yes Electronic Signature(s) Signed: 08/21/2022 11:01:15 AM By: Worthy Rancher Entered By: Worthy Rancher on 08/21/2022 08:52:01 -------------------------------------------------------------------------------- Compression Therapy Details Patient Name: Date of Service: [name], [place] [name] 08/21/2022 9:15 A M Medical Record Number: [id number] Patient Account Number: [id number] Date of Birth/Sex: Treating RN: January 27, 1971 (52 y.o. [name] Primary Care Tally Mattox: [name] Other Clinician: Referring Autym Siess: Treating Nijah Tejera/Extender: Elyse Jarvis Weeks in Treatment: 60 Compression Therapy Performed for Wound Assessment: Wound #17  Right,Lateral Lower Leg Performed By: Clinician Adline Peals, RN Compression Type: Double Layer Post Procedure Diagnosis Same as Pre-procedure Electronic Signature(s) Signed: 08/21/2022 4:18:38 PM By: Adline Peals Entered By: Adline Peals on 08/21/2022 09:20:50 -------------------------------------------------------------------------------- Compression Therapy Details Patient Name: Date of Service: [name], FA [name] 08/21/2022 9:15 A M Medical Record Number: [id number] Patient Account Number: [id number] Date of Birth/Sex: Treating RN: 14-Sep-1970 (52 y.o. [name] Primary Care Leanna Hamid: [name] Other Clinician: Referring Lincy Belles: Treating Zaveon Gillen/Extender: Elyse Jarvis Weeks in Treatment: 60 Compression Therapy Performed for Wound Assessment: Wound #24 Left,Lateral Ankle Performed By: Clinician Adline Peals, RN Compression Type: Double Layer Post Procedure Diagnosis Same as Remus Loffler ([id number] Page 2 of 10 Electronic Signature(s) Signed: 08/21/2022 4:18:38 PM By: Adline Peals Entered By: Adline Peals on 08/21/2022 09:20:50 -------------------------------------------------------------------------------- Encounter Discharge Information Details Patient Name: Date of Service: [name], FA [name] 08/21/2022 9:15 A M Medical Record Number: [id number] Patient Account Number: [id number] Date of Birth/Sex: Treating RN: 04/20/1971 (52 y.o. [name] Primary Care Khanh Tanori: [name] Other Clinician: Referring Anetria Harwick: Treating Chanise Habeck/Extender: Elyse Jarvis Weeks in Treatment: 31 Encounter Discharge Information Items Post Procedure Vitals Discharge Condition: Stable Temperature (F): 97.9 Ambulatory Status: Cane Pulse (bpm): 82 Discharge Destination: Home Respiratory Rate (breaths/min): 20 Transportation: Private  Auto Blood Pressure (mmHg): 138/75 Accompanied By: self Schedule Follow-up Appointment: Yes Clinical Summary of Care: Patient Declined Electronic Signature(s) Signed: 08/21/2022 4:18:38 PM By: Adline Peals Entered By: Adline Peals on 08/21/2022 09:49:03 -------------------------------------------------------------------------------- Lower Extremity Assessment Details Patient Name: Date of Service: [name], [place] [name] 08/21/2022 9:15 A M Medical Record Number: [id number] Patient Account Number: [id number] Date of Birth/Sex: Treating RN: Sep 05, 1970 (52 y.o. [name] Primary Care Lailee Hoelzel: [name] Other Clinician: Referring Jaquon Gingerich: Treating Kaytlan Behrman/Extender: Elyse Jarvis Weeks in Treatment: 70 Edema Assessment Assessed: [Left: No] [Right: No] Edema: [Left: Ye] [Right: s] Calf Left: Right: Point of Measurement: 33 cm From Medial Instep 30  cm 31 cm Ankle Left: Right: Point of Measurement: 10 cm From Medial Instep 18.5 cm 21 cm Electronic Signature(s) Signed: 08/21/2022 4:18:38 PM By: Adline Peals Entered By: Adline Peals on 08/21/2022 09:11:45 -------------------------------------------------------------------------------- Multi Wound Chart Details Patient Name: Date of Service: [name], FA [name] 08/21/2022 9:15 A M Medical Record Number: [id number] Patient Account Number: [id number] Date of Birth/Sex: Treating RN: Feb 16, 1971 (52 y.o. F) Primary Care Gabbriella Presswood: [name] Other Clinician: Referring Koya Hunger: Treating Niya Behler/Extender: Elyse Jarvis Weeks in Treatment: [address] [place], Legrand Rams ([id number]) [id number] Page 3 of 10 Height(in): 67 Pulse(bpm): 82 Weight(lbs): 134 Blood Pressure(mmHg): 138/75 Body Mass Index(BMI): 21 Temperature(F): 97.9 Respiratory Rate(breaths/min): 20 [17:Photos:] Right, Lateral Lower Leg Right, Medial Ankle Left, Lateral  Ankle Wound Location: Gradually Appeared Gradually Appeared Gradually Appeared Wounding Event: Sickle Cell Lesion Sickle Cell Lesion Sickle Cell Lesion Primary Etiology: N/A Venous Leg Ulcer N/A Secondary Etiology: Anemia, Sickle Cell Disease, Anemia, Sickle Cell Disease, Anemia, Sickle Cell Disease, Comorbid History: Neuropathy Neuropathy Neuropathy 10/05/2012 06/26/2021 08/06/2022 Date Acquired: 70 60 1 Weeks of Treatment: Open Open Open Wound Status: No No No Wound Recurrence: Yes No No Clustered Wound: 6.3x6.3x0.2 7x3.2x0.1 1.4x2.5x0.1 Measurements L x W x D (cm) 31.172 17.593 2.749 A (cm) : rea 6.234 1.759 0.275 Volume (cm) : 83.40% 39.30% -112.10% % Reduction in A rea: 66.80% 39.30% -111.50% % Reduction in Volume: Full Thickness Without Exposed Full Thickness Without Exposed Full Thickness Without Exposed Classification: Support Structures Support Structures Support Structures Medium Medium Medium Exudate A mount: Purulent Purulent Serosanguineous Exudate Type: yellow, brown, green yellow, brown, green red, brown Exudate Color: Distinct, outline attached Distinct, outline attached Distinct, outline attached Wound Margin: Small (1-33%) Medium (34-66%) Small (1-33%) Granulation A mount: Pink Pink Pink Granulation Quality: Large (67-100%) Medium (34-66%) Large (67-100%) Necrotic A mount: Adherent [place], Adherent Slough Necrotic Tissue: Fat Layer (Subcutaneous Tissue): Yes Fat Layer (Subcutaneous Tissue): Yes Fat Layer (Subcutaneous Tissue): Yes Exposed Structures: Fascia: No Fascia: No Fascia: No Tendon: No Tendon: No Tendon: No Muscle: No Muscle: No Muscle: No Joint: No Joint: No Joint: No Bone: No Bone: No Bone: No Small (1-33%) Small (1-33%) [name] Epithelialization: Debridement - Selective/Open Wound Debridement - Selective/Open Wound N/A Debridement: Pre-procedure Verification/Time Out 09:19 09:19  N/A Taken: Lidocaine 4% Topical Solution Lidocaine 4% Topical Solution N/A Pain Control: [employer] N/A Tissue Debrided: Non-Viable Tissue Non-Viable Tissue N/A Level: 16 12 N/A Debridement A (sq cm): rea Curette Curette N/A Instrument: Minimum Minimum N/A Bleeding: Pressure Pressure N/A Hemostasis A chieved: Procedure was tolerated well Procedure was tolerated well N/A Debridement Treatment Response: 6.3x6.3x0.2 7x3.2x0.1 N/A Post Debridement Measurements L x W x D (cm) 6.234 1.759 N/A Post Debridement Volume: (cm) Scarring: Yes Scarring: Yes No Abnormalities Noted Periwound Skin Texture: No Abnormalities Noted No Abnormalities Noted No Abnormalities Noted Periwound Skin Moisture: Ecchymosis: No No Abnormalities Noted No Abnormalities Noted Periwound Skin Color: No Abnormality No Abnormality No Abnormality Temperature: Yes Yes Yes Tenderness on Palpation: Compression Therapy Debridement Compression Therapy Procedures Performed: Debridement Treatment Notes Electronic Signature(s) Signed: 08/21/2022 9:44:25 AM By: Fredirick Maudlin MD FACS Entered By: Fredirick Maudlin on 08/21/2022 09:44:25 [name] ([id number]) [id number] Page 4 of 10 -------------------------------------------------------------------------------- Multi-Disciplinary Care Plan Details Patient Name: Date of Service: [name] 08/21/2022 9:15 A M Medical Record Number: [id number] Patient Account Number: [id number] Date of Birth/Sex: Treating RN: 05-Jun-1970 (52 y.o. [name] Primary Care Jorel Gravlin: [name] Other Clinician: Referring Shonta Bourque: Treating Lemonte Al/Extender: Fredirick Maudlin  [name] Weeks in Treatment: 70 Multidisciplinary Care Plan reviewed with physician Active Inactive Venous Leg Ulcer Nursing Diagnoses: Actual venous Insuffiency (use after diagnosis is confirmed) Knowledge deficit related to disease process and  management Goals: Patient will maintain optimal edema control Date Initiated: 06/26/2021 Target Resolution Date: 09/20/2022 Goal Status: Active Interventions: Assess peripheral edema status every visit. Compression as ordered Treatment Activities: Therapeutic compression applied : 06/26/2021 Notes: Wound/Skin Impairment Nursing Diagnoses: Impaired tissue integrity Goals: Patient/caregiver will verbalize understanding of skin care regimen Date Initiated: 04/17/2021 Target Resolution Date: 09/20/2022 Goal Status: Active Ulcer/skin breakdown will have a volume reduction of 30% by week 4 Date Initiated: 04/17/2021 Date Inactivated: 05/29/2021 Target Resolution Date: 05/15/2021 Goal Status: Met Ulcer/skin breakdown will have a volume reduction of 50% by week 8 Date Initiated: 05/29/2021 Date Inactivated: 06/26/2021 Target Resolution Date: 06/26/2021 Goal Status: Unmet Unmet Reason: venous reflux Interventions: Assess patient/caregiver ability to obtain necessary supplies Assess patient/caregiver ability to perform ulcer/skin care regimen upon admission and as needed Assess ulceration(s) every visit Provide education on ulcer and skin care Treatment Activities: Topical wound management initiated : 04/17/2021 Notes: 06/08/21: Left leg wounds greater than 30% volume reduction, right leg acute infection. Electronic Signature(s) Signed: 08/21/2022 4:18:38 PM By: [name] By: Adline Peals on 08/21/2022 08:56:48 -------------------------------------------------------------------------------- Pain Assessment Details Patient Name: Date of Service: [name], FA [name] 08/21/2022 9:15 A M Medical Record Number: [id number] Patient Account Number: [id number] Date of Birth/Sex: Treating RN: 01-20-1971 (51 y.o. Antony Salmon, Legrand Rams ([id number]) [id number] Page 5 of 10 Primary Care Rani Sisney: [name] Other Clinician: Referring Modesto Ganoe: Treating  Theadora Noyes/Extender: Elyse Jarvis Weeks in Treatment: 38 Active Problems Location of Pain Severity and Description of Pain Patient Has Paino Yes Site Locations Rate the pain. Current Pain Level: 8 Worst Pain Level: 10 Least Pain Level: 0 Tolerable Pain Level: 4 Pain Management and Medication Current Pain Management: Electronic Signature(s) Signed: 08/21/2022 11:01:15 AM By: Worthy Rancher Entered By: Worthy Rancher on 08/21/2022 08:52:36 -------------------------------------------------------------------------------- Patient/Caregiver Education Details Patient Name: Date of Service: [name] [name], FA [name] 4/3/2024andnbsp9:15 [place] Record Number: [id number] Patient Account Number: [id number] Date of Birth/Gender: Treating RN: 1970/10/05 (52 y.o. [name] Primary Care Physician: [name] Other Clinician: Referring Physician: Treating Physician/Extender: Elyse Jarvis Weeks in Treatment: 85 Education Assessment Education Provided To: Patient Education Topics Provided Wound/Skin Impairment: Methods: Explain/Verbal Responses: Reinforcements needed, State content correctly Electronic Signature(s) Signed: 08/21/2022 4:18:38 PM By: Adline Peals Entered By: Adline Peals on 08/21/2022 08:57:11 -------------------------------------------------------------------------------- Wound Assessment Details Patient Name: Date of Service: [name], FA [name] 08/21/2022 9:15 A M Medical Record Number: [id number] Patient Account Number: [id number] Date of Birth/Sex: Treating RN: 11-Mar-1971 (52 y.o. F) Primary Care Pedrohenrique Mccon[place]: [name] Other Clinician: Referring Marizol Borror: Treating Sakiyah Shur/Extender: Elyse Jarvis [place], Legrand Rams ([id number]) [id number] Page 6 of 10 Weeks in Treatment: 70 Wound Status Wound Number: 17 Primary Etiology: Sickle Cell Lesion Wound Location: Right, Lateral  Lower Leg Wound Status: Open Wounding Event: Gradually Appeared Comorbid History: Anemia, Sickle Cell Disease, Neuropathy Date Acquired: 10/05/2012 Weeks Of Treatment: 70 Clustered Wound: Yes Photos Wound Measurements Length: (cm) 6.3 Width: (cm) 6.3 Depth: (cm) 0.2 Area: (cm) 31.172 Volume: (cm) 6.234 % Reduction in Area: 83.4% % Reduction in Volume: 66.8% Epithelialization: Small (1-33%) Tunneling: No Undermining: No Wound Description Classification: Full Thickness Without Exposed Support Structures Wound Margin: Distinct, outline attached Exudate Amount: Medium Exudate Type: Purulent Exudate Color: yellow, brown, green Foul Odor After Cleansing: No  Slough/Fibrino Yes Wound Bed Granulation Amount: Small (1-33%) Exposed Structure Granulation Quality: Pink Fascia Exposed: No Necrotic Amount: Large (67-100%) Fat Layer (Subcutaneous Tissue) Exposed: Yes Necrotic Quality: Adherent Slough Tendon Exposed: No Muscle Exposed: No Joint Exposed: No Bone Exposed: No Periwound Skin Texture Texture Color No Abnormalities Noted: No No Abnormalities Noted: No Scarring: Yes Ecchymosis: No Moisture Temperature / Pain No Abnormalities Noted: Yes Temperature: No Abnormality Tenderness on Palpation: Yes Treatment Notes Wound #17 (Lower Leg) Wound Laterality: Right, Lateral Cleanser Soap and Water Discharge Instruction: May shower and wash wound with dial antibacterial soap and water prior to dressing change. Wound Cleanser Discharge Instruction: Cleanse the wound with wound cleanser prior to applying a clean dressing using gauze sponges, not tissue or cotton balls. Peri-Wound Care Triamcinolone 15 (g) Discharge Instruction: Use triamcinolone 15 (g) as directed Sween Lotion (Moisturizing lotion) Discharge Instruction: Apply moisturizing lotion as directed Topical SWATHI, SOCKS ([id number] Page 7 of 10 Skintegrity Hydrogel 4  (oz) Discharge Instruction: Apply hydrogel as directed Primary Dressing Promogran Prisma Matrix, 4.34 (sq in) (silver collagen) Discharge Instruction: Moisten collagen with saline or hydrogel Secondary Dressing ABD Pad, 5x9 Discharge Instruction: Apply over primary dressing as directed. CarboFLEX Odor Control Dressing, 4x4 in Discharge Instruction: Apply over primary dressing as directed. Secured With Compression Wrap Urgo K2 Lite, two layer compression system, regular Discharge Instruction: Apply Urgo K2 Lite as directed (alternative to 3 layer compression). Compression Stockings Add-Ons Electronic Signature(s) Signed: 08/21/2022 4:18:38 PM By: Adline Peals Entered By: Adline Peals on 08/21/2022 09:13:04 -------------------------------------------------------------------------------- Wound Assessment Details Patient Name: Date of Service: [name], [place] [name] 08/21/2022 9:15 A M Medical Record Number: [id number] Patient Account Number: [id number] Date of Birth/Sex: Treating RN: Oct 31, 1970 (52 y.o. F) Primary Care Matsue Strom: [name] Other Clinician: Referring Cordarrel Stiefel: Treating Tommi Crepeau/Extender: Elyse Jarvis Weeks in Treatment: 71 Wound Status Wound Number: 21 Primary Etiology: Sickle Cell Lesion Wound Location: Right, Medial Ankle Secondary Etiology: Venous Leg Ulcer Wounding Event: Gradually Appeared Wound Status: Open Date Acquired: 06/26/2021 Comorbid History: Anemia, Sickle Cell Disease, Neuropathy Weeks Of Treatment: 60 Clustered Wound: No Photos Wound Measurements Length: (cm) 7 Width: (cm) 3.2 Depth: (cm) 0.1 Area: (cm) 17.593 Volume: (cm) 1.759 % Reduction in Area: 39.3% % Reduction in Volume: 39.3% Epithelialization: Small (1-33%) Tunneling: No Undermining: No Wound Description Classification: Full Thickness Without Exposed Support Structures Wound Margin: Distinct, outline attached Exudate Amount: Medium Exudate  Type: Purulent Exudate Color: yellow, brown, green Ormond, Mackinzie ([id number]) Foul Odor After Cleansing: No Slough/Fibrino Yes [phone].pdf Page 8 of 10 Wound Bed Granulation Amount: Medium (34-66%) Exposed Structure Granulation Quality: Pink Fascia Exposed: No Necrotic Amount: Medium (34-66%) Fat Layer (Subcutaneous Tissue) Exposed: Yes Necrotic Quality: Adherent Slough Tendon Exposed: No Muscle Exposed: No Joint Exposed: No Bone Exposed: No Periwound Skin Texture Texture Color No Abnormalities Noted: No No Abnormalities Noted: Yes Scarring: Yes Temperature / Pain Temperature: No Abnormality Moisture No Abnormalities Noted: Yes Tenderness on Palpation: Yes Treatment Notes Wound #21 (Ankle) Wound Laterality: Right, Medial Cleanser Soap and Water Discharge Instruction: May shower and wash wound with dial antibacterial soap and water prior to dressing change. Wound Cleanser Discharge Instruction: Cleanse the wound with wound cleanser prior to applying a clean dressing using gauze sponges, not tissue or cotton balls. Peri-Wound Care Triamcinolone 15 (g) Discharge Instruction: Use triamcinolone 15 (g) as directed Sween Lotion (Moisturizing lotion) Discharge Instruction: Apply moisturizing lotion as directed Topical Skintegrity Hydrogel 4 (oz) Discharge Instruction: Apply hydrogel as directed Primary Dressing Promogran Prisma Matrix,  4.34 (sq in) (silver collagen) Discharge Instruction: Moisten collagen with saline or hydrogel Secondary Dressing ABD Pad, 5x9 Discharge Instruction: Apply over primary dressing as directed. CarboFLEX Odor Control Dressing, 4x4 in Discharge Instruction: Apply over primary dressing as directed. Secured With Compression Wrap Urgo K2 Lite, two layer compression system, regular Discharge Instruction: Apply Urgo K2 Lite as directed (alternative to 3 layer compression). Compression Stockings Add-Ons Electronic  Signature(s) Signed: 08/21/2022 4:18:38 PM By: Adline Peals Entered By: Adline Peals on 08/21/2022 09:13:17 -------------------------------------------------------------------------------- Wound Assessment Details Patient Name: Date of Service: [name], [place] [name] 08/21/2022 9:15 A M Medical Record Number: [id number] Patient Account Number: [id number] Date of Birth/Sex: Treating RN: 03-17-1971 (52 y.o. [name] Primary Care Dana Dorner: [name] Other Clinician: Referring Allyssa Abruzzese: Treating Daylee Delahoz/Extender: Elyse Jarvis Weeks in Treatment: [address], [place] ([id number]) [id number] Page 9 of 10 Wound Status Wound Number: 24 Primary Etiology: Sickle Cell Lesion Wound Location: Left, Lateral Ankle Wound Status: Open Wounding Event: Gradually Appeared Comorbid History: Anemia, Sickle Cell Disease, Neuropathy Date Acquired: 08/06/2022 Weeks Of Treatment: 1 Clustered Wound: No Photos Wound Measurements Length: (cm) 1.4 Width: (cm) 2.5 Depth: (cm) 0.1 Area: (cm) 2.749 Volume: (cm) 0.275 % Reduction in Area: -112.1% % Reduction in Volume: -111.5% Epithelialization: [name] Tunneling: No Undermining: No Wound Description Classification: Full Thickness Without Exposed Support Structures Wound Margin: Distinct, outline attached Exudate Amount: Medium Exudate Type: Serosanguineous Exudate Color: red, brown Foul Odor After Cleansing: No Slough/Fibrino Yes Wound Bed Granulation Amount: Small (1-33%) Exposed Structure Granulation Quality: Pink Fascia Exposed: No Necrotic Amount: Large (67-100%) Fat Layer (Subcutaneous Tissue) Exposed: Yes Necrotic Quality: Eschar, Adherent Slough Tendon Exposed: No Muscle Exposed: No Joint Exposed: No Bone Exposed: No Periwound Skin Texture Texture Color No Abnormalities Noted: Yes No Abnormalities Noted: Yes Moisture Temperature / Pain No Abnormalities Noted:  Yes Temperature: No Abnormality Tenderness on Palpation: Yes Treatment Notes Wound #24 (Ankle) Wound Laterality: Left, Lateral Cleanser Soap and Water Discharge Instruction: May shower and wash wound with dial antibacterial soap and water prior to dressing change. Wound Cleanser Discharge Instruction: Cleanse the wound with wound cleanser prior to applying a clean dressing using gauze sponges, not tissue or cotton balls. Peri-Wound Care Triamcinolone 15 (g) Discharge Instruction: Use triamcinolone 15 (g) as directed Sween Lotion (Moisturizing lotion) Discharge Instruction: Apply moisturizing lotion as directed Topical Skintegrity Hydrogel 4 (oz) Discharge Instruction: Apply hydrogel as directed NYELAH, VALAZQUEZ ([id number]) [id number] Page 10 of 10 Primary Dressing Promogran Prisma Matrix, 4.34 (sq in) (silver collagen) Discharge Instruction: Moisten collagen with saline or hydrogel Secondary Dressing ABD Pad, 5x9 Discharge Instruction: Apply over primary dressing as directed. CarboFLEX Odor Control Dressing, 4x4 in Discharge Instruction: Apply over primary dressing as directed. Secured With Compression Wrap Urgo K2 Lite, two layer compression system, regular Discharge Instruction: Apply Urgo K2 Lite as directed (alternative to 3 layer compression). Compression Stockings Add-Ons Electronic Signature(s) Signed: 08/21/2022 4:18:38 PM By: Adline Peals Entered By: Adline Peals on 08/21/2022 09:12:31 -------------------------------------------------------------------------------- Vitals Details Patient Name: Date of Service: [name] [name], FA [name] 08/21/2022 9:15 A M Medical Record Number: [id number] Patient Account Number: [id number] Date of Birth/Sex: Treating RN: 10/05/1970 (52 y.o. F) Primary Care Rollan Roger: [name] Other Clinician: Referring Walterine Amodei: Treating Aquan Kope/Extender: Elyse Jarvis Weeks in Treatment:  64 Vital Signs Time Taken: 08:52 Temperature (F): 97.9 Height (in): 67 Pulse (bpm): 82 Weight (lbs): 134 Respiratory Rate (breaths/min): 20 Body Mass Index (BMI): 21 Blood Pressure (mmHg): 138/75 Reference Range: 80 - 120 mg /  dl Electronic Signature(s) Signed: 08/21/2022 11:01:15 AM By: Worthy Rancher Entered By: Worthy Rancher on 08/21/2022 08:52:22

## 2022-08-21 NOTE — Progress Notes (Signed)
[name], [name] ([id number]) [id number] Page 1 of 17 Visit Report for 08/21/2022 Chief Complaint Document Details Patient Name: Date of Service: [name] [name] 08/21/2022 9:15 A M Medical Record Number: [id number] Patient Account Number: [id number] Date of Birth/Sex: Treating RN: [birth year] (52 y.o. F) Primary Care Provider: Cammie [name] Other Clinician: Referring Provider: Treating Provider/Extender: [name] in Treatment: 4 Information Obtained from: Patient Chief Complaint the patient is here for evaluation of her bilateral lower extremity [name] cell ulcers 04/17/2021; patient comes in for substantial wounds on the right and left lower leg Electronic Signature(s) Signed: 08/21/2022 9:44:32 AM By: [name] Maudlin MD FACS Entered By: [name] on 08/21/2022 09:44:32 -------------------------------------------------------------------------------- Debridement Details Patient Name: Date of Service: [name], [name] 08/21/2022 9:15 A M Medical Record Number: [id number] Patient Account Number: [id number] Date of Birth/Sex: Treating RN: 1971-04-28 (52 y.o. [name] Primary Care Provider: Cammie [name] Other Clinician: Referring Provider: Treating Provider/Extender: [name] in Treatment: 24 Debridement Performed for Assessment: Wound #17 Right,Lateral Lower Leg Performed By: Physician [name] Maudlin, MD Debridement Type: Debridement Level of Consciousness (Pre-procedure): Awake and Alert Pre-procedure Verification/Time Out Yes - 09:19 Taken: Start Time: 09:19 Pain Control: Lidocaine 4% T opical Solution T Area Debrided (L x W): otal 4 (cm) x 4 (cm) = 16 (cm) Tissue and other material debrided: Non-Viable, Slough, Slough Level: Non-Viable Tissue Debridement Description: Selective/Open Wound Instrument: Curette Bleeding: Minimum Hemostasis Achieved:  Pressure Response to Treatment: Procedure was tolerated well Level of Consciousness (Post- Awake and Alert procedure): Post Debridement Measurements of Total Wound Length: (cm) 6.3 Width: (cm) 6.3 Depth: (cm) 0.2 Volume: (cm) 6.234 Character of Wound/Ulcer Post Debridement: Improved Post Procedure Diagnosis Same as Pre-procedure Notes scribed for Dr. Celine [name] by [name] Peals, RN Electronic Signature(s) Signed: 08/21/2022 10:02:36 AM By: [name] Maudlin MD FACS Signed: 08/21/2022 4:18:38 PM By: [name] Entered By: [name] on 08/21/2022 09:20:39 [name] ([id number]) [id number] Page 2 of 17 -------------------------------------------------------------------------------- Debridement Details Patient Name: Date of Service: [name] [name] 08/21/2022 9:15 A M Medical Record Number: [id number] Patient Account Number: [id number] Date of Birth/Sex: Treating RN: Feb 14, 1971 (52 y.o. [name] Primary Care Provider: Cammie [name] Other Clinician: Referring Provider: Treating Provider/Extender: [name] in Treatment: 66 Debridement Performed for Assessment: Wound #21 Right,Medial Ankle Performed By: Physician [name] Maudlin, MD Debridement Type: Debridement Severity of Tissue Pre Debridement: Fat layer exposed Level of Consciousness (Pre-procedure): Awake and Alert Pre-procedure Verification/Time Out Yes - 09:19 Taken: Start Time: 09:19 Pain Control: Lidocaine 4% T opical Solution T Area Debrided (L x W): otal 6 (cm) x 2 (cm) = 12 (cm) Tissue and other material debrided: Non-Viable, Slough, Slough Level: Non-Viable Tissue Debridement Description: Selective/Open Wound Instrument: Curette Bleeding: Minimum Hemostasis Achieved: Pressure Response to Treatment: Procedure was tolerated well Level of Consciousness (Post- Awake and Alert procedure): Post Debridement Measurements of  Total Wound Length: (cm) 7 Width: (cm) 3.2 Depth: (cm) 0.1 Volume: (cm) 1.759 Character of Wound/Ulcer Post Debridement: Improved Severity of Tissue Post Debridement: Fat layer exposed Post Procedure Diagnosis Same as Pre-procedure Notes scribed for Dr. Celine [name] by [name] Peals, RN Electronic Signature(s) Signed: 08/21/2022 10:02:36 AM By: [name] Maudlin MD FACS Signed: 08/21/2022 4:18:38 PM By: [name] Entered By: [name] on 08/21/2022 09:21:35 -------------------------------------------------------------------------------- HPI Details Patient Name: Date of Service: [name], [name] 08/21/2022 9:15 A M Medical Record Number: [id number] Patient Account Number: [id number] Date of Birth/Sex: Treating RN: May 20, 1971 (  52 y.o. F) Primary Care Provider: Cammie [name] Other Clinician: Referring Provider: Treating Provider/Extender: [name] in Treatment: 29 History of Present Illness Location: medial and lateral ankle region on the right and left medial malleolus Quality: Patient reports experiencing a shooting pain to affected area(s). Severity: Patient states wound(s) are getting worse. Duration: right lower extremity bimalleolar ulcers have been present for approximately 2 years; the rright meedial malleolus ulcer has been there proximally 6 months Timing: Pain in wound is constant (hurts all the time) Context: The wound would happen gradually ssociated Signs and Symptoms: Patient reports having increase discharge. 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 Leland [name] for a surgical debridement as I was unable to do anything in the office due to excruciating pain. At that stage she was referred from the plastic surgery service to dermatology who treated her for a skin infection with  doxycycline and then Levaquin and a local antibiotic [name] ([id number]) [id number] Page 3 of 17 ointment. I understand the patient has since developed ulceration on the left ankle both medial and lateral and was now referred back to the wound center as dermatology has finished the management. 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 She has been visiting the wound center [facility] and was seen by Dr. Dellia Nims and Dr. Leland [name]. after prolonged conservator therapy between July 2016 and January 2017. She had been seen by the plastic surgeon and taken to the OR for debridement and application of Theraskin. She had 3 applications of Theraskin and was then treated with collagen. Prior to that she had a history of similar problems in 2014 and was treated conservatively. Had a reflux study done for the right lower extremity in August 2016 without reflux or DVT . Past medical history significant for [name] cell disease, anemia, leg ulcers, cholelithiasis,and has never been a smoker. Once the patient was discharged on the wound center she says within 2 or 3 [name] the problems recurred and she has been treating it conservatively. since I saw her 3 [name] ago at [facility] she has been unable to get her dressing material but has completed a course of doxycycline. 6/7/ 2017 -- lower extremity venous duplex reflux evaluation was done No evidence of SVT or DVT in the RLL. No venous incompetence in the RLL. No further vascular workup is indicated at this time. She was seen by Dr. [name], on 10/04/2015. She agreed with the plan of taking her to the OR for debridement and application of theraskin and would also take biopsies to rule out pyoderma  gangrenosum. Follow-up note dated May 31 received and she was status post application of Theraskin to multiple ulcers around the right ankle. Pathology did not show evidence of malignancy or pyoderma gangrenosum. She would continue to see as in the wound clinic for further care and see Dr. Leland [name] as needed. The patient brought the biopsy report and it was consistent with stasis ulcer no evidence of malignancy and the comment was that there was some adjacent neovascularization, fibrosis and patchy perivascular chronic inflammation. 11/15/2015 -- today we applied her first application of Theraskin 11/30/15; TheraSkin #2 12/13/2015 --  she is having a lot of pain locally and is here for possible application of a theraskin today. 01/16/2016 -- the patient has significant pain and has noticed despite in spite of all local care and oral pain medication. It is impossible to debride her in the office. 02/06/2016 -- I do not see any notes from Dr. Iran Planas( the patient has not made a call to the office know as she heard from them) and the only visit to recently was with her PCP Dr. Danella Penton -- I saw her on 01/16/2016 and prescribed 90 tablets of oxycodone 10 mg and did lab work and screening for HIV. the HIV was negative and hemoglobin was 6.3 with a WBC count of 14.9 and hematocrit of 17.8 with platelets of 561. reticulocyte count was 15.5% READMISSION: 07/10/2016- The patient is here for readmission for bilateral lower extremity ulcers in the presence of [name] cell. The bimalleolar ulcers to the right lower extremity have been present for approximately 2 years, the left medial malleolus ulcer has been present approximately 6 months. She has followed with Dr.Thimmappa in the past and has had a total of 3 applications of Theraskin (01/2015, 09/2015, 06/17/16). She has also followed with Dr. Con Memos here in the clinic and has received 2 applications of TheraSkin (11/10/15, 11/30/15). The patient does  experience chronic, and is not amenable to debridement. She had a [name] cell crisis in December 2017, prior to that has been several years. She is not currently on any antibiotic therapy and has not been treated with any recently. 07/17/2016 -- was seen by Dr. Iran Planas of plastic surgery who saw her 2 [name] postop application of Theraskin #3. She had removed her dressing and asked her to apply silver alginate on alternate days and follow-up back with the wound center. Future debridements and application of skin substitute would have to be done in the hospital due to her high risk for anesthesia. READMISSION 04/17/2021 Patient is now a 52 year old woman that we have had in this clinic for a prolonged period of time and 2016-2017 and then again for 2 visits in February 2018. At that point she had wounds on the right lower leg predominantly medial. She had also been seen by plastic surgery Dr. Leland [name] who I believe took her to the OR for operative debridement and application of TheraSkin in 2017. After she left our clinic she was followed for a very prolonged period of time in the wound care center in [facility] who then referred her ultimately to [facility] where she was seen by Dr. Vernona Rieger. Again taken her to the OR for skin grafting which apparently did not take. She had multiple other attempts at dressings although I have not really looked over all of these notes in great detail. She has not been seen in a wound care center in about a year. She states over the last year in addition to her right lower leg she has developed wounds on the left lower leg quite extensive. She is using Xeroform to all of these wounds without really any improvement. She also has Medicaid which does not cover wound products. The patient has had vascular work-ups in the past including most recently on 03/28/2021 showing biphasic waveforms on the right triphasic at the PTA and biphasic at the dorsalis pedis on the left. She was  unable to tolerate any degree of compression to do ABIs. Unfortunately TBI's were also not done. She had venous reflux studies done in 2017. This did not show any  evidence of a DVT or SVT and no venous incompetence was noted in the right leg at the time this was the only side with the wound As noted I did not look all over her old records. She apparently had a course of HBO and [facility] although I am not sure what the indication would have been. In any case she developed seizures and terminated treatment earlier. She is generally much more disabled than when we last saw her in clinic. She can no longer walk pretty much wheelchair-bound because predominantly of pain in the left hip. 04/24/2021; the patient tolerated the wraps we put on. We used Santyl and Hydrofera Blue under compression. I brought her back for a nurse visit for a change in dressing. With Medicaid we will have a hard time getting anything paid for and hence the need for compression. She arrives in clinic with all the wounds looking somewhat better in terms of surface 12/20; circumferential wound on the right from the lateral to the medial. She has open areas on the left medial and left lateral x2 on all of this with the same surface. This does not look completely healthy although she does have some epithelialization. She is not complaining of a lot of pain which is unusual for her [name] ulcers. I have not looked over her extensive records from [facility]. She had recent arterial studies and has a history of venous reflux studies I will need to look these over although I do not believe she has significant arterial disease 2023 05/22/2021; patient's wound areas measure slightly smaller. Still a lot of drainage coming from the right we have been using Hydrofera Blue and Santyl with some improvement in the wound surfaces. She tells me she will be getting transfused later in the week for her underlying [name] cell anemia I have looked over her  recent arterial studies which were done in the fall. This was in November and showed biphasic and triphasic waveforms but she could not tolerate ABIs because of pressure and unfortunately TBI's were not done. She has not had recent venous reflux studies that I can see 1/10; not much change about the same surface area. This has a yellowish surface to it very gritty. We have been using Santyl and Hydrofera Blue for a prolonged period. Culture I did last week showed methicillin sensitive staph aureus "rare". Our intake nurse reports greenish drainage which may be the Hydrofera Blue itself 1/17; wounds are continue to measure smaller although I am not sure about the accuracy here. Especially the areas on the right are covered in what looks to be a nonviable surface although she does have some epithelialization. Similarly she has areas on the left medial and left lateral ankle area which appear to have a better surface and perhaps are slightly smaller. We have been using Santyl and Hydrofera Blue. She cannot tolerate mechanical debridements She went for her reflux studies which showed significant reflux at the greater saphenous vein at the saphenofemoral junction as well as the greater saphenous TOBYN, CHICA ([id number]) [id number] Page 4 of 17 vein in the proximal calf on the left she had reflux in the thigh and the common femoral vein and supra vein Fishel vein reflux in the greater saphenous vein. I will have vein and vascular look at this. My thoughts have been that these are likely [name] wounds. I looked through her old records from [facility] wound care center and then when she graduated to [facility] wound care center where  she saw Dr. Zigmund Daniel and Dr. Vernona Rieger. Although I can see she had reflux studies done I do not see that she actually saw a vein and vascular. I went over the fact that she had operative debridements and actual skin grafting that did not take. I  do not think these wounds have ever really progressed towards healing 1/31;Substantial wounds on the right ankle area. Hyper granulated very gritty adherent debris on the surface. 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 On the left she has no DVT or SVT deep vein reflux in the common femoral vein. Superficial vein reflux in the greater saphenous vein on although the vein diameter is not really all that large. I do not think there is anything that can be done with these although I am going to send her for consultation to vein and vascular. 2/7; Wound exam; substantial wound area on the right posterior ankle area and areas on the left medial ankle and left lateral ankle. I was able to debride the left medial ankle last week fairly aggressively and it is back this week to a completely nonviable surface She will see vascular surgery this Friday and I would like them to review the venous studies and also any comments on her arterial status. If they do not see an issue here I am going to refer her to plastic surgery for an operative debridement perhaps intraoperative ACell or Integra. Eventually she will require a deep tissue culture again 2/14; substantial wound area on the right posterior ankle, medial ankle. We have been using silver alginate The patient was seen by vein and vascular she had both venous reflux studies and arterial studies. 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 She had no evidence of superficial venous thrombosis. She did have superficial vein reflux  at the saphenofemoral junction and the greater saphenous vein. On the left no evidence of a DVT no evidence of superficial venous throat thrombosis she did have deep vein reflux in the common femoral vein and superficial vein reflux in the greater saphenous vein but these were not felt to be amenable to ablation. In terms of arterial studies she had triphasic and wife biphasic wave waveforms bilaterally not felt to have a significant arterial issue. I do not get the feeling that they felt that any part of her nonhealing wounds were related to either arterial or venous issues. They did note that she had venous reflux at the right at the [facility] and [facility]. And also on the left there were reflux in the deep system at the common femoral vein and greater saphenous vein in the proximal thigh. Nothing amenable to ablation. 2/20; she is making some decent progress on the right where there is nice skin between the 2 open areas on the right ankle. The surfaces here do not look viable yet there is some surrounding epithelialization. She still has a small area on the left medial ankle area. Hyper-granulated Jody's away always 2/28 patient has an appointment with plastic surgery on 3/8. We will see her back  on 3/9. She may have to call us to get the area redressed. We've been using Santyl under silver alginate. We made a nice improvement on the left medial ankle. The larger wounds on the right also looks somewhat better in terms of epithelialization although I think they could benefit from an aggressive debridement if plastic surgery would be willing to do that. Perhaps placement of Integra or a cell 07/26/2021: She saw Dr. Claudia Desanctis yesterday. He raised the question as to whether or not this might be pyoderma and wanted to wait until that question was answered by dermatology before proceeding with any sort of operative debridement. We have continue to use Santyl under silver alginate with Kerlix and Coban wraps. Overall, her  wounds appear to be continuing to contract and epithelialize, with some granulation tissue present. There continues to be some slough on all wound surfaces. 08/09/2021: She has not been able to get an appointment with dermatology because apparently the offices in [place] do not accept Medicaid. She is looking into whether or not she can be seen at the main [facility] dermatology clinic. This is necessary because plastic surgery is concerned that her wounds might represent pyoderma and they did not want to do any procedure until that was clarified. We have been using Santyl under silver alginate with Kerlix and Coban wraps. Today, there was a greater amount of drainage on her dressings with a slight green discoloration and significant odor. Despite this, her wounds continue to contract and epithelialize. There is pale granulation tissue present and actually, on the left medial ankle, the granulation tissue is a bit hypertrophic. 08/16/2021: Last week, I took a culture and this grew back rare methicillin-resistant Staph aureus and rare corynebacterium. The MRSA was sensitive to gentamicin which we began applying topically on an empiric basis. This week, her wounds are a bit smaller and the drainage and odor are less. Her primary care provider is working on assisting the patient with a dermatology evaluation. She has been in silver alginate over the gentamicin that was started last week along with Kerlix and Coban wraps. 08/23/2021: Because she has Medicaid, we have been unable to get her into see any dermatologist in the [place] to rule out pyoderma gangrenosum, which was a requirement from plastic surgery prior to any sort of debridement and grafting. Despite this, however, all of her wounds continue to get smaller. The wound on her left medial ankle is nearly closed. There is no odor from the wounds, although she still accumulates a modest amount of drainage on her  dressings. 08/30/2021: The lateral right ankle wound and the medial left ankle wound are a bit smaller today. The medial right ankle wound is about the same size. They are less tender. We have still been unable to get her into dermatology. 09/06/2021: All of the wounds are about the same size today. She continues to endorse minimal pain. I communicated with Dr. Claudia Desanctis in plastic surgery regarding our issues getting a dermatology appointment; he was out of town but indicated that he would look into perhaps performing the biopsy in his office and will have his office contact her. 09/14/2021: The patient has an appointment in dermatology, but it is not until October. Her wounds are roughly the same; she continues to have very thick purulent-looking drainage on her dressings. 09/20/2021: The left medial wound is nearly closed and just has a bit of accumulated eschar on the surface. The right medial and lateral ankle wounds are  perhaps a little bit smaller. They continue to have a very pale surface with accumulation of thin slough. 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 I did not think [employer] was indicated based on this. She is getting topical mupirocin with Prisma silver collagen. 10/04/2021: The patient was not seen in clinic last week due to childcare coverage issues. In the interim, the left medial leg wound has closed. The right sided leg wounds are smaller. There is more granulation tissue coming through, particularly on the lateral wound. The surface remains somewhat gritty. We have been applying topical mupirocin and Prisma silver collagen. 10/11/2021: The left medial leg wound remains closed. She does complain of some anesthetic sensation to the area. Both of the right-sided leg wounds are smaller but still have accumulated slough. 10/18/2021: Both right-sided leg wounds are minimally smaller this week. She still continues to accumulate slough and has thick drainage on her  dressings. 10/23/2021: Both wounds continue to contract. There is still slough buildup. She has been approved for a keratin-based skin substitute trial product but it will not be available until next week. 10/30/2021: The wounds are about the same to perhaps slightly smaller. There is still continued slough buildup. Unfortunately, the rep for the keratin based product did not show up today and did not answer his phone when called. 11/08/2021: The wounds are little bit smaller today. She continues to have thick drainage but the surfaces are relatively clean with just a little bit of slough accumulation. She reported to me today that she is unable to completely flex her left ankle and on examination it seems this is potentially related to scar tissue from her wounds. We do have the ProgenaMatrix trial product available for her today. ARRISSA, DEKONING ([id number]) [id number] Page 5 of 17 11/15/2021: Both wounds are smaller today. There is some slough accumulation on the surfaces, but the medial wound, in particular looks like it is filling in and is less deep. She did hear from physical therapy and she is going to start working with them on July 11. She is here for her second application of the trial skin substitute, ProgenaMatrix. 11/22/2021: Both wounds continue to contract, the medial more dramatically than the lateral. Both wounds have a layer of slough on the surface, but underneath this, the gritty fibrous tissue has a little bit more of a pink cast to it rather than being as pale as it has been. 11/29/2021: The wounds are roughly the same size this week, perhaps a millimeter or 2 smaller. The medial wound has filled in and is nearly flush with the surrounding skin surface. She continues to have a lot of slough accumulation on both surfaces. 12/06/2021: No significant change to her wounds, but she has a new opening on her dorsal foot, just distal to the right lateral ankle wound.  The area on her left medial ankle that reopened looks a little bit larger today. She has quite a bit of pain associated with the new wound. 12/12/2021: Her wounds look about the same but the new opening on her right lateral dorsal foot is a little bit bigger. She continues to have a fair amount of pain with this wound. 12/26/2021: The left medial ankle wound is tiny and superficial. She has 2 areas of crusting on her left lateral ankle, however, that appear to be threatening to open again. Her right medial ankle wound is a little bit smaller today but still continues to accumulate thick rubbery slough. The  new dorsal foot wound is exquisitely painful but there is no odor or purulent drainage. No erythema or induration. The right lateral ankle wound looks about the same today, again with thick rubbery slough. 01/03/2022: The left medial ankle wound has closed again. Both right ankle wounds appear to be about the same size with thick rubbery slough. The dorsal foot wound on the right continues to be quite painful and she stated that she did not want any debridement of that site today. 01/10/2022: No real change to any of her wounds. She continues to accumulate thick slough. The dorsal foot wound has merged with the lateral malleolar wound. She is experiencing significant pain in the dorsal foot portion of the ulcer. 01/16/2022: Absolutely no change or progress in her wounds. 01/24/2022: Her wounds are unchanged. She continues to build up slough and the wounds on her dorsal right foot are still exquisitely tender. 01/31/2022: The wounds actually measure a little bit narrower today. They still have thick slough on the surface but the underlying tissue seems a little less fibrotic. We changed to Iodosorb last week. 02/07/2022: Wounds continue to slowly epithelialize around the parameters. She has less pain today. She still accumulates a fairly substantial layer of slough. 02/15/2022: No change to the wounds  overall. The measured a little bit larger today per the intake nurse. She continues to have substantial slough accumulation and her pain is a little bit worse. 02/21/2022. No change at all to her wounds. I do not see that plastic surgery has received her referral yet. 02/28/2022: The wounds remain unchanged. They are dry and fibrotic with accumulation of slough and eschar. She did receive an appointment to see plastic surgery on October 23, but the office called her back and indicated they needed to reschedule and that the next available appointment was not until December. The patient became angry and decided she did not want to see this plastics group. 10/19; the patient sees Dr. Lovett Calender again next week. She is using Medihoney as the primary dressing changing this herself. 03/21/2022: She saw Dr. Jaclynn Guarneri at the wound care center at [facility]. Dr. Jaclynn Guarneri was also unable to debride her, secondary to pain and is planning to take her to the operating room for operative debridement and potential skin substitute placement. Her wounds are unchanged with thick yellow slough and a fibrotic base. She says they are too painful to be debrided. In addition, yesterday her hemoglobin was 4 and she received 2 units packed red blood cell transfusion. 04/04/2022: Her operation at [facility] is scheduled for December 15. She continues to use Medihoney on her wounds. They are a little bit less painful today and she is willing to entertain the possibility of debridement. The wounds measured a little bit larger in all dimensions today but the layer of slough is not as thick as usual. 04/18/2022: Her wounds actually measured a little bit smaller today and the extension onto the dorsal foot from the lateral wound has healed. They are less tender and there is significantly less slough present as compared to prior visits. 05/09/2022: She had to reschedule her surgery due to lack of childcare. It is  now planned for January 5. The wounds are basically unchanged, but she had a lot more drainage, which was thick and somewhat purulent in appearance. No significant odor. 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 They are quite painful today and she does not want to have debridement performed. 05/30/2022: The patient underwent  surgical debridement and placement of TheraSkin to her wounds on January 5. She has been doing well and does not endorse any pain. The TheraSkin is intact and looks beautiful. It is sutured in place. There is no exudate or significant drainage. 06/04/2022: Her TheraSkin remains intact and I am starting to see granulation tissue emerging underneath the surface. No concern for infection. 06/12/2022: The TheraSkin is intact and there is more granulation tissue emerging. It is starting to look a little bit dry, however. No malodor or purulent drainage. 06/19/2022: The TheraSkin continues to look good. The edges are a bit dry, but the central portion of each of her wounds is showing a nice pink color. 06/27/2022: The TheraSkin on the lateral wound remains almost completely intact. There is nice pink tissue peaking through the mesh of the TheraSkin. On the medial leg, it is starting to breakdown a little bit, but most of the TheraSkin remains intact here, as well. Both wounds measured smaller today. 07/03/2022: The wounds are fairly dry around the perimeter today. Some of the suture is hanging loose. 07/12/2022: Both wounds are measuring smaller today. The medial ulcer is getting a bit too dry. The lateral ulcer continues to have nice buds of granulation tissue emerging. 07/26/2022: The moisture balance on both wounds is much better today. For the first time since I began seeing her, I see actual beefy red granulation tissue on the lateral ankle wound. There are more buds of deep pink granulation tissue emerging on the medial ankle. There is some dry eschar around the edges of  the medial wound and a tiny amount of slough overlying the good granulation tissue on the lateral wound. 08/09/2022: Unfortunately, she has a new wound on her left lateral lower leg, just above the ankle. It is extremely painful for her. It penetrates to the fat layer and has the same woody, fibrotic character as her previous wounds. The other wounds are both a little bit smaller with slough and some eschar around the periphery. 08/14/2022: Her right lateral ankle wound is a little bit smaller. Unfortunately, she has had an increase in her drainage and it has a purulent nature to it, similar to what was present prior to her surgical debridement. She also reports odor coming from her wounds this week. The left lateral leg wound remains extremely tender. [name], [name] ([id number]) [id number] Page 6 of 17 08/21/2022: The left lateral ankle wound is larger and more painful today. Both right ankle wounds have a little bit more vital appearance, but they have accumulated more slough. She reports less odor this week. She is currently taking Augmentin. Electronic Signature(s) Signed: 08/21/2022 9:53:44 AM By: [name] Maudlin MD FACS Previous Signature: 08/21/2022 9:47:13 AM Version By: [name] Maudlin MD FACS Entered By: [name] on 08/21/2022 09:53:44 -------------------------------------------------------------------------------- Physical Exam Details Patient Name: Date of Service: [name], [name] 08/21/2022 9:15 A M Medical Record Number: [id number] Patient Account Number: [id number] Date of Birth/Sex: Treating RN: March 30, 1971 (52 y.o. F) Primary Care Provider: Cammie [name] Other Clinician: Referring Provider: Treating Provider/Extender: [name] in Treatment: 74 Constitutional . . . . no acute distress. Respiratory Normal work of breathing on room air. Notes 08/21/2022: The left lateral ankle wound is larger and more painful  today. Both right ankle wounds have a little bit more vital appearance, but they have accumulated more slough. She reports less odor this week. Electronic Signature(s) Signed: 08/21/2022 9:54:31 AM By: [name] Maudlin MD FACS Entered By: [name] on  08/21/2022 09:54:30 -------------------------------------------------------------------------------- Physician Orders Details Patient Name: Date of Service: [name] [name] 08/21/2022 9:15 A M Medical Record Number: [id number] Patient Account Number: [id number] Date of Birth/Sex: Treating RN: 22-Feb-1971 (52 y.o. 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 - Dr. Celine [name] - room 2 Return A Anesthetic (In clinic) Topical Lidocaine 4% applied to wound bed Bathing/ Shower/ Hygiene May shower with protection but do not get wound dressing(s) wet. Protect dressing(s) with water repellant cover (for example, large plastic bag) or a cast cover and may then take shower. Edema Control - Lymphedema / SCD / Other Elevate legs to the level of the heart or above for 30 minutes daily and/or when sitting for 3-4 times a day throughout the day. Avoid standing for long periods of time. Exercise regularly Additional Orders / Instructions Follow Nutritious Diet Juven Shake 1-2 times daily. [name], [name] ([id number]) [id number] Page 7 of 17 Wound Treatment Wound #17 - Lower Leg Wound Laterality: Right, Lateral Cleanser: Soap and Water 1 x Per  Week/30 Days Discharge Instructions: May shower and wash wound with dial antibacterial soap and water prior to dressing change. Cleanser: Wound Cleanser 1 x Per Week/30 Days Discharge Instructions: Cleanse the wound with wound cleanser prior to applying a clean dressing using gauze sponges, not tissue or cotton balls. Peri-Wound Care: Triamcinolone 15 (g) 1 x Per Week/30 Days Discharge Instructions: Use triamcinolone 15 (g) as directed Peri-Wound Care: Sween Lotion (Moisturizing lotion) 1 x Per Week/30 Days Discharge Instructions: Apply moisturizing lotion as directed Topical: Skintegrity Hydrogel 4 (oz) 1 x Per Week/30 Days Discharge Instructions: Apply hydrogel as directed Prim Dressing: Promogran Prisma Matrix, 4.34 (sq in) (silver collagen) 1 x Per Week/30 Days ary Discharge Instructions: Moisten collagen with saline or hydrogel Secondary Dressing: ABD Pad, 5x9 1 x Per Week/30 Days Discharge Instructions: Apply over primary dressing as directed. Secondary Dressing: CarboFLEX Odor Control Dressing, 4x4 in 1 x Per Week/30 Days Discharge Instructions: Apply over primary dressing as directed. Compression Wrap: Urgo K2 Lite, two layer compression system, regular 1 x Per Week/30 Days Discharge Instructions: Apply Urgo K2 Lite as directed (alternative to 3 layer compression). Wound #21 - Ankle Wound Laterality: Right, Medial Cleanser: Soap and Water 1 x Per Week/30 Days Discharge Instructions: May shower and wash wound with dial antibacterial soap and water prior to dressing change. Cleanser: Wound Cleanser 1 x Per Week/30 Days Discharge Instructions: Cleanse the wound with wound cleanser prior to applying a clean dressing using gauze sponges, not tissue or cotton balls. Peri-Wound Care: Triamcinolone 15 (g) 1 x Per Week/30 Days Discharge Instructions: Use triamcinolone 15 (g) as directed Peri-Wound Care: Sween Lotion (Moisturizing lotion) 1 x Per Week/30 Days Discharge Instructions: Apply  moisturizing lotion as directed Topical: Skintegrity Hydrogel 4 (oz) 1 x Per Week/30 Days Discharge Instructions: Apply hydrogel as directed Prim Dressing: Promogran Prisma Matrix, 4.34 (sq in) (silver collagen) 1 x Per Week/30 Days ary Discharge Instructions: Moisten collagen with saline or hydrogel Secondary Dressing: ABD Pad, 5x9 1 x Per Week/30 Days Discharge Instructions: Apply over primary dressing as directed. Secondary Dressing: CarboFLEX Odor Control Dressing,  4x4 in 1 x Per Week/30 Days Discharge Instructions: Apply over primary dressing as directed. Compression Wrap: Urgo K2 Lite, two layer compression system, regular 1 x Per Week/30 Days Discharge Instructions: Apply Urgo K2 Lite as directed (alternative to 3 layer compression). Wound #24 - Ankle Wound Laterality: Left, Lateral Cleanser: Soap and Water 1 x Per Week/30 Days Discharge Instructions: May shower and wash wound with dial antibacterial soap and water prior to dressing change. Cleanser: Wound Cleanser 1 x Per Week/30 Days Discharge Instructions: Cleanse the wound with wound cleanser prior to applying a clean dressing using gauze sponges, not tissue or cotton balls. Peri-Wound Care: Triamcinolone 15 (g) 1 x Per Week/30 Days Discharge Instructions: Use triamcinolone 15 (g) as directed Peri-Wound Care: Sween Lotion (Moisturizing lotion) 1 x Per Week/30 Days Discharge Instructions: Apply moisturizing lotion as directed Topical: Skintegrity Hydrogel 4 (oz) 1 x Per Week/30 Days Discharge Instructions: Apply hydrogel as directed Prim Dressing: Promogran Prisma Matrix, 4.34 (sq in) (silver collagen) 1 x Per Week/30 Days ary Discharge Instructions: Moisten collagen with saline or hydrogel [name], [name] ([id number]) [id number] Page 8 of 17 Secondary Dressing: ABD Pad, 5x9 1 x Per Week/30 Days Discharge Instructions: Apply over primary dressing as directed. Secondary Dressing: CarboFLEX Odor  Control Dressing, 4x4 in 1 x Per Week/30 Days Discharge Instructions: Apply over primary dressing as directed. Compression Wrap: Urgo K2 Lite, two layer compression system, regular 1 x Per Week/30 Days Discharge Instructions: Apply Urgo K2 Lite as directed (alternative to 3 layer compression). Patient Medications llergies: No Known Allergies A Notifications Medication Indication Start End 08/21/2022 lidocaine DOSE topical 4 % cream - cream topical Electronic Signature(s) Signed: 08/21/2022 10:02:36 AM By: [name] Maudlin MD FACS Entered By: [name] on 08/21/2022 09:54:50 -------------------------------------------------------------------------------- Problem List Details Patient Name: Date of Service: [name], [name] 08/21/2022 9:15 A M Medical Record Number: [id number] Patient Account Number: [id number] Date of Birth/Sex: Treating RN: 1970/12/12 (52 y.o. 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 04/17/2021 No Yes Inactive Problems ICD-10 Code Description Active Date Inactive Date L97.828 Non-pressure chronic ulcer of other part of left lower leg with other specified severity 04/17/2021 04/17/2021 Resolved Problems Electronic Signature(s) Signed: 08/21/2022 9:44:19 AM By: [name] Maudlin MD FACS Entered By: [name] on 08/21/2022 09:44:18 [name] ([id number]) [id number] Page 9 of 17 -------------------------------------------------------------------------------- Progress  Note Details Patient Name: Date of Service: [name], [name] [name] 08/21/2022 9:15 A M Medical Record Number: [id number] Patient Account Number: [id number] Date of Birth/Sex: Treating RN: [birth year] (52 y.o. F) Primary Care Provider: Cammie [name] Other Clinician: Referring Provider: Treating Provider/Extender: [name] in Treatment: 61 Subjective Chief Complaint Information obtained from Patient the patient is here for evaluation of her bilateral lower extremity [name] cell ulcers 04/17/2021; patient comes in for substantial wounds on the right and left lower leg History of Present Illness (HPI) The following HPI elements were documented for the patient's wound: Location: medial and lateral ankle region on the right and left medial malleolus Quality: Patient reports experiencing a shooting pain to affected area(s). Severity: Patient states wound(s) are getting worse. Duration: right lower extremity  bimalleolar ulcers have been present for approximately 2 years; the rright meedial malleolus ulcer has been there proximally 6 months Timing: Pain in wound is constant (hurts all the time) Context: The wound would happen gradually Associated Signs and Symptoms: Patient reports having increase discharge. 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 Leland [name] for a surgical debridement as I was unable to do anything in the office due to excruciating pain. At that stage she was referred from the plastic surgery service to dermatology who treated her for a skin infection with doxycycline and then Levaquin and a local antibiotic ointment. I understand the patient has since developed ulceration on the left ankle both medial and lateral and was now referred back to the wound center as dermatology has finished the management. 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 She has been visiting the wound center [facility] and was seen by Dr. Dellia Nims and Dr. Leland [name]. after prolonged conservator therapy between July 2016 and January 2017. She had been seen by the plastic surgeon and taken to the OR for debridement and application of Theraskin. She had 3 applications of Theraskin and was then treated with collagen. Prior to that she had a history of similar problems in 2014 and was treated conservatively. Had a reflux study done for the right lower extremity in August 2016 without reflux or DVT . Past medical history significant for [name] cell disease, anemia, leg ulcers, cholelithiasis,and has never been a smoker. Once the patient was discharged on the wound center she says within 2 or 3 [name] the problems recurred and she has been treating it conservatively. since I saw her 3 [name] ago at [facility] she has been unable to get her dressing material but has completed a course of doxycycline. 6/7/ 2017 -- lower extremity venous duplex reflux evaluation was done oo No evidence of SVT or DVT in the RLL. No venous incompetence in the RLL. No further vascular workup is indicated at this time. She was seen by Dr. [name], on 10/04/2015. She agreed with the plan of taking her to the OR for debridement and application of theraskin and would also take biopsies to rule out pyoderma gangrenosum. Follow-up note dated May 31 received and she was status post application of Theraskin to multiple ulcers around the right ankle. Pathology did not show evidence of malignancy or pyoderma gangrenosum. She would continue to see as in the wound clinic for further care and see Dr. Leland [name] as needed. The patient brought the biopsy report and it was  consistent with stasis ulcer no evidence of malignancy and the comment was that there was some adjacent neovascularization, fibrosis and patchy perivascular chronic inflammation. 11/15/2015 -- today we applied her first application of Theraskin 11/30/15; TheraSkin #2 12/13/2015 -- she is having a lot of pain locally and is here for possible application of a theraskin today. 01/16/2016 -- the patient has significant pain and has noticed despite in spite of all local care and oral pain medication. It is impossible to debride her in the office. 02/06/2016 -- I do not see any notes from Dr. Iran Planas( the patient has not made a call to the office know  as she heard from them) and the only visit to recently was with her PCP Dr. Danella Penton -- I saw her on 01/16/2016 and prescribed 90 tablets of oxycodone 10 mg and did lab work and screening for HIV. the HIV was negative and hemoglobin was 6.3 with a WBC count of 14.9 and hematocrit of 17.8 with platelets of 561. reticulocyte count was 15.5% READMISSION: 07/10/2016- The patient is here for readmission for bilateral lower extremity ulcers in the presence of [name] cell. The bimalleolar ulcers to the right lower extremity have been present for approximately 2 years, the left medial malleolus ulcer has been present approximately 6 months. She has followed with Dr.Thimmappa in the past and has had a total of 3 applications of Theraskin (01/2015, 09/2015, 06/17/16). She has also followed with Dr. Con Memos here in the clinic and has received 2 applications of TheraSkin (11/10/15, 11/30/15). The patient does experience chronic, and is not amenable to debridement. She had a [name] cell crisis in December 2017, prior to that has been several years. She is not currently on any antibiotic therapy and has not been treated with any recently. 07/17/2016 -- was seen by Dr. Iran Planas of plastic surgery who saw her 2 [name] postop application of Theraskin #3. She had removed her  dressing and asked her to apply silver alginate on alternate days and follow-up back with the wound center. Future debridements and application of skin substitute would have to be done in the hospital due to her high risk for anesthesia. READMISSION 04/17/2021 Patient is now a 52 year old woman that we have had in this clinic for a prolonged period of time and 2016-2017 and then again for 2 visits in February 2018. At that point she had wounds on the right lower leg predominantly medial. She had also been seen by plastic surgery Dr. Leland [name] who I believe took her to the OR for operative debridement and application of TheraSkin in 2017. After she left our clinic she was followed for a very prolonged period of time in the wound care center in [facility] who then referred her ultimately to [facility] where she was seen by Dr. Vernona Rieger. Again taken her to the OR for skin grafting which apparently did not take. She had multiple other attempts at dressings although I have not really looked over all of these notes in great detail. She has not been seen in a wound care center in about a year. She states over the last year in addition to her right lower leg she has developed wounds on the left lower leg quite extensive. She is using Xeroform to all of these wounds without really any improvement. She also has Medicaid which does not cover wound products. The patient has had vascular work-ups in the past including most recently on 03/28/2021 showing biphasic waveforms on the right triphasic at the PTA and biphasic at the dorsalis pedis on the left. She was unable to tolerate any degree of compression to do ABIs. Unfortunately TBI's were also not done. She had venous reflux studies done in 2017. This did not show any evidence of a DVT or SVT and no venous incompetence was noted in the right leg at the time this was the only side with the wound [name], [name] ([id number]) [id number] Page  10 of 17 As noted I did not look all over her old records. She apparently had a course of [facility] and [facility] although I am not sure what the indication would have been. In any case she  developed seizures and terminated treatment earlier. She is generally much more disabled than when we last saw her in clinic. She can no longer walk pretty much wheelchair-bound because predominantly of pain in the left hip. 04/24/2021; the patient tolerated the wraps we put on. We used Santyl and Hydrofera Blue under compression. I brought her back for a nurse visit for a change in dressing. With Medicaid we will have a hard time getting anything paid for and hence the need for compression. She arrives in clinic with all the wounds looking somewhat better in terms of surface 12/20; circumferential wound on the right from the lateral to the medial. She has open areas on the left medial and left lateral x2 on all of this with the same surface. This does not look completely healthy although she does have some epithelialization. She is not complaining of a lot of pain which is unusual for her [name] ulcers. I have not looked over her extensive records from [facility]. She had recent arterial studies and has a history of venous reflux studies I will need to look these over although I do not believe she has significant arterial disease 2023 05/22/2021; patient's wound areas measure slightly smaller. Still a lot of drainage coming from the right we have been using Hydrofera Blue and Santyl with some improvement in the wound surfaces. 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 This was in November and showed biphasic and triphasic waveforms but she could not tolerate ABIs because of pressure and unfortunately TBI's were not done. She has not had recent venous reflux studies that I can see 1/10; not much  change about the same surface area. This has a yellowish surface to it very gritty. We have been using Santyl and Hydrofera Blue for a prolonged period. Culture I did last week showed methicillin sensitive staph aureus "rare". Our intake nurse reports greenish drainage which may be the Hydrofera Blue itself 1/17; wounds are continue to measure smaller although I am not sure about the accuracy here. Especially the areas on the right are covered in what looks to be a nonviable surface although she does have some epithelialization. Similarly she has areas on the left medial and left lateral ankle area which appear to have a better surface and perhaps are slightly smaller. We have been using Santyl and Hydrofera Blue. She cannot tolerate mechanical debridements She went for her reflux studies which showed significant reflux at the greater saphenous vein at the saphenofemoral junction as well as the greater saphenous vein in the proximal calf on the left she had reflux in the thigh and the common femoral vein and supra vein Fishel vein reflux in the greater saphenous vein. I will have vein and vascular look at this. My thoughts have been that these are likely [name] wounds. I looked through her old records from [facility] wound care center and then when she graduated to [facility] wound care center where she saw Dr. Zigmund Daniel and Dr. Vernona Rieger. Although I can see she had reflux studies done I do not see that she actually saw a vein and vascular. I went over the fact that she had operative debridements and actual skin grafting that did not take. I do not think these wounds have ever really progressed towards healing 1/31;Substantial wounds on the right ankle area. Hyper granulated very gritty adherent  debris on the surface. 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 On the left she has no DVT or SVT deep vein reflux in the common femoral vein. Superficial vein reflux in the greater saphenous vein on although the vein diameter is not really all that large. I do not think there is anything that can be done with these although I am going to send her for consultation to vein and vascular. 2/7; Wound exam; substantial wound area on the right posterior ankle area and areas on the left medial ankle and left lateral ankle. I was able to debride the left medial ankle last week fairly aggressively and it is back this week to a completely nonviable surface She will see vascular surgery this Friday and I would like them to review the venous studies and also any comments on her arterial status. If they do not see an issue here I am going to refer her to plastic surgery for an operative debridement perhaps intraoperative ACell or Integra. Eventually she will require a deep tissue culture again 2/14; substantial wound area on the right posterior ankle, medial ankle. We have been using silver alginate The patient was seen by vein and vascular she had both venous reflux studies and arterial studies. 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 She had no evidence of superficial venous thrombosis. She did have superficial vein reflux at the saphenofemoral junction and the greater saphenous vein. On the left no evidence of a DVT no evidence of superficial venous throat thrombosis she did have deep vein reflux in the common femoral vein and superficial vein reflux in the greater saphenous vein but these were not felt to be amenable to ablation. In terms of arterial studies she had triphasic and  wife biphasic wave waveforms bilaterally not felt to have a significant arterial issue. I do not get the feeling that they felt that any part of her nonhealing wounds were related to either arterial or venous issues. They did note that she had venous reflux at the right at the [facility] and [facility]. And also on the left there were reflux in the deep system at the common femoral vein and greater saphenous vein in the proximal thigh. Nothing amenable to ablation. 2/20; she is making some decent progress on the right where there is nice skin between the 2 open areas on the right ankle. The surfaces here do not look viable yet there is some surrounding epithelialization. She still has a small area on the left medial ankle area. Hyper-granulated Jody's away always 2/28 patient has an appointment with plastic surgery on 3/8. We will see her back on 3/9. She may have to call us to get the area redressed. We've been using Santyl under silver alginate. We made a nice improvement on the left medial ankle. The larger wounds on the right also looks somewhat better in terms of epithelialization although I think they could benefit from an aggressive debridement if plastic surgery would be willing to do that. Perhaps placement of Integra or a cell  07/26/2021: She saw Dr. Claudia Desanctis yesterday. He raised the question as to whether or not this might be pyoderma and wanted to wait until that question was answered by dermatology before proceeding with any sort of operative debridement. We have continue to use Santyl under silver alginate with Kerlix and Coban wraps. Overall, her wounds appear to be continuing to contract and epithelialize, with some granulation tissue present. There continues to be some slough on all wound surfaces. 08/09/2021: She has not been able to get an appointment with dermatology because apparently the offices in [place] do not accept Medicaid. She is looking into whether or not she can be seen at  the main [facility] dermatology clinic. This is necessary because plastic surgery is concerned that her wounds might represent pyoderma and they did not want to do any procedure until that was clarified. We have been using Santyl under silver alginate with Kerlix and Coban wraps. Today, there was a greater amount of drainage on her dressings with a slight green discoloration and significant odor. Despite this, her wounds continue to contract and epithelialize. There is pale granulation tissue present and actually, on the left medial ankle, the granulation tissue is a bit hypertrophic. 08/16/2021: Last week, I took a culture and this grew back rare methicillin-resistant Staph aureus and rare corynebacterium. The MRSA was sensitive to gentamicin which we began applying topically on an empiric basis. This week, her wounds are a bit smaller and the drainage and odor are less. Her primary care provider is working on assisting the patient with a dermatology evaluation. She has been in silver alginate over the gentamicin that was started last week along with Kerlix and Coban wraps. 08/23/2021: Because she has Medicaid, we have been unable to get her into see any dermatologist in the [place] to rule out pyoderma gangrenosum, which was a requirement from plastic surgery prior to any sort of debridement and grafting. Despite this, however, all of her wounds continue to get smaller. The wound on Sanderson, [name] ([id number]) [id number] Page 11 of 17 her left medial ankle is nearly closed. There is no odor from the wounds, although she still accumulates a modest amount of drainage on her dressings. 08/30/2021: The lateral right ankle wound and the medial left ankle wound are a bit smaller today. The medial right ankle wound is about the same size. They are less tender. We have still been unable to get her into dermatology. 09/06/2021: All of the wounds are about the same size today.  She continues to endorse minimal pain. I communicated with Dr. Claudia Desanctis in plastic surgery regarding our issues getting a dermatology appointment; he was out of town but indicated that he would look into perhaps performing the biopsy in his office and will have his office contact her. 09/14/2021: The patient has an appointment in dermatology, but it is not until October. Her wounds are roughly the same; she continues to have very thick purulent-looking drainage on her dressings. 09/20/2021: The left medial wound is nearly closed and just has a bit of accumulated eschar on the surface. The right medial and lateral ankle wounds are perhaps a little bit smaller. They continue to have a very pale surface with accumulation of thin slough. 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 I did not think [employer] was indicated based on this. She is getting topical mupirocin with Prisma silver collagen. 10/04/2021: The patient was not seen in clinic last week due to childcare  coverage issues. In the interim, the left medial leg wound has closed. The right sided leg wounds are smaller. There is more granulation tissue coming through, particularly on the lateral wound. The surface remains somewhat gritty. We have been applying topical mupirocin and Prisma silver collagen. 10/11/2021: The left medial leg wound remains closed. She does complain of some anesthetic sensation to the area. Both of the right-sided leg wounds are smaller but still have accumulated slough. 10/18/2021: Both right-sided leg wounds are minimally smaller this week. She still continues to accumulate slough and has thick drainage on her dressings. 10/23/2021: Both wounds continue to contract. There is still slough buildup. She has been approved for a keratin-based skin substitute trial product but it will not be available until next week. 10/30/2021: The wounds are about the same to perhaps slightly smaller. There is still continued  slough buildup. Unfortunately, the rep for the keratin based product did not show up today and did not answer his phone when called. 11/08/2021: The wounds are little bit smaller today. She continues to have thick drainage but the surfaces are relatively clean with just a little bit of slough accumulation. She reported to me today that she is unable to completely flex her left ankle and on examination it seems this is potentially related to scar tissue from her wounds. We do have the ProgenaMatrix trial product available for her today. 11/15/2021: Both wounds are smaller today. There is some slough accumulation on the surfaces, but the medial wound, in particular looks like it is filling in and is less deep. She did hear from physical therapy and she is going to start working with them on July 11. She is here for her second application of the trial skin substitute, ProgenaMatrix. 11/22/2021: Both wounds continue to contract, the medial more dramatically than the lateral. Both wounds have a layer of slough on the surface, but underneath this, the gritty fibrous tissue has a little bit more of a pink cast to it rather than being as pale as it has been. 11/29/2021: The wounds are roughly the same size this week, perhaps a millimeter or 2 smaller. The medial wound has filled in and is nearly flush with the surrounding skin surface. She continues to have a lot of slough accumulation on both surfaces. 12/06/2021: No significant change to her wounds, but she has a new opening on her dorsal foot, just distal to the right lateral ankle wound. The area on her left medial ankle that reopened looks a little bit larger today. She has quite a bit of pain associated with the new wound. 12/12/2021: Her wounds look about the same but the new opening on her right lateral dorsal foot is a little bit bigger. She continues to have a fair amount of pain with this wound. 12/26/2021: The left medial ankle wound is tiny and  superficial. She has 2 areas of crusting on her left lateral ankle, however, that appear to be threatening to open again. Her right medial ankle wound is a little bit smaller today but still continues to accumulate thick rubbery slough. The new dorsal foot wound is exquisitely painful but there is no odor or purulent drainage. No erythema or induration. The right lateral ankle wound looks about the same today, again with thick rubbery slough. 01/03/2022: The left medial ankle wound has closed again. Both right ankle wounds appear to be about the same size with thick rubbery slough. The dorsal foot wound on the right continues to be quite painful  and she stated that she did not want any debridement of that site today. 01/10/2022: No real change to any of her wounds. She continues to accumulate thick slough. The dorsal foot wound has merged with the lateral malleolar wound. She is experiencing significant pain in the dorsal foot portion of the ulcer. 01/16/2022: Absolutely no change or progress in her wounds. 01/24/2022: Her wounds are unchanged. She continues to build up slough and the wounds on her dorsal right foot are still exquisitely tender. 01/31/2022: The wounds actually measure a little bit narrower today. They still have thick slough on the surface but the underlying tissue seems a little less fibrotic. We changed to Iodosorb last week. 02/07/2022: Wounds continue to slowly epithelialize around the parameters. She has less pain today. She still accumulates a fairly substantial layer of slough. 02/15/2022: No change to the wounds overall. The measured a little bit larger today per the intake nurse. She continues to have substantial slough accumulation and her pain is a little bit worse. 02/21/2022. No change at all to her wounds. I do not see that plastic surgery has received her referral yet. 02/28/2022: The wounds remain unchanged. They are dry and fibrotic with accumulation of slough and eschar. She  did receive an appointment to see plastic surgery on October 23, but the office called her back and indicated they needed to reschedule and that the next available appointment was not until December. The patient became angry and decided she did not want to see this plastics group. 10/19; the patient sees Dr. Lovett Calender again next week. She is using Medihoney as the primary dressing changing this herself. 03/21/2022: She saw Dr. Jaclynn Guarneri at the wound care center at [facility]. Dr. Jaclynn Guarneri was also unable to debride her, secondary to pain and is planning to take her to the operating room for operative debridement and potential skin substitute placement. Her wounds are unchanged with thick yellow slough and a fibrotic base. She says they are too painful to be debrided. In addition, yesterday her hemoglobin was 4 and she received 2 units packed red blood cell transfusion. 04/04/2022: Her operation at [facility] is scheduled for December 15. She continues to use Medihoney on her wounds. They are a little bit less painful today and she is willing to entertain the possibility of debridement. The wounds measured a little bit larger in all dimensions today but the layer of slough is not as thick as usual. 04/18/2022: Her wounds actually measured a little bit smaller today and the extension onto the dorsal foot from the lateral wound has healed. They are less [name], [name] ([id number]) [id number] Page 12 of 17 tender and there is significantly less slough present as compared to prior visits. 05/09/2022: She had to reschedule her surgery due to lack of childcare. It is now planned for January 5. The wounds are basically unchanged, but she had a lot more drainage, which was thick and somewhat purulent in appearance. No significant odor. 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 They are quite painful today and she does not want to have debridement  performed. 05/30/2022: The patient underwent surgical debridement and placement of TheraSkin to her wounds on January 5. She has been doing well and does not endorse any pain. The TheraSkin is intact and looks beautiful. It is sutured in place. There is no exudate or significant drainage. 06/04/2022: Her TheraSkin remains intact and I am starting to see granulation tissue emerging underneath the surface. No concern for  infection. 06/12/2022: The TheraSkin is intact and there is more granulation tissue emerging. It is starting to look a little bit dry, however. No malodor or purulent drainage. 06/19/2022: The TheraSkin continues to look good. The edges are a bit dry, but the central portion of each of her wounds is showing a nice pink color. 06/27/2022: The TheraSkin on the lateral wound remains almost completely intact. There is nice pink tissue peaking through the mesh of the TheraSkin. On the medial leg, it is starting to breakdown a little bit, but most of the TheraSkin remains intact here, as well. Both wounds measured smaller today. 07/03/2022: The wounds are fairly dry around the perimeter today. Some of the suture is hanging loose. 07/12/2022: Both wounds are measuring smaller today. The medial ulcer is getting a bit too dry. The lateral ulcer continues to have nice buds of granulation tissue emerging. 07/26/2022: The moisture balance on both wounds is much better today. For the first time since I began seeing her, I see actual beefy red granulation tissue on the lateral ankle wound. There are more buds of deep pink granulation tissue emerging on the medial ankle. There is some dry eschar around the edges of the medial wound and a tiny amount of slough overlying the good granulation tissue on the lateral wound. 08/09/2022: Unfortunately, she has a new wound on her left lateral lower leg, just above the ankle. It is extremely painful for her. It penetrates to the fat layer and has the same woody, fibrotic  character as her previous wounds. The other wounds are both a little bit smaller with slough and some eschar around the periphery. 08/14/2022: Her right lateral ankle wound is a little bit smaller. Unfortunately, she has had an increase in her drainage and it has a purulent nature to it, similar to what was present prior to her surgical debridement. She also reports odor coming from her wounds this week. The left lateral leg wound remains extremely tender. 08/21/2022: The left lateral ankle wound is larger and more painful today. Both right ankle wounds have a little bit more vital appearance, but they have accumulated more slough. She reports less odor this week. She is currently taking Augmentin. Patient History Information obtained from Patient. Family History Diabetes - Mother, Lung Disease - Mother, No family history of Cancer, Heart Disease, Hereditary Spherocytosis, Hypertension, Kidney Disease, Seizures, Stroke, Thyroid Problems, Tuberculosis. Social History Never smoker, Marital Status - Married, Alcohol Use - Never, Drug Use - No History, Caffeine Use - Daily. Medical History Eyes Denies history of Cataracts, Glaucoma, Optic Neuritis Ear/Nose/Mouth/Throat Denies history of Chronic sinus problems/congestion, Middle ear problems Hematologic/Lymphatic Patient has history of Anemia, [name] Cell Disease Denies history of Hemophilia, Human Immunodeficiency Virus, Lymphedema Respiratory Denies history of Aspiration, Asthma, Chronic Obstructive Pulmonary Disease (COPD), Pneumothorax, Sleep Apnea, Tuberculosis Cardiovascular Denies history of Angina, Arrhythmia, Congestive Heart Failure, Coronary Artery Disease, Deep Vein Thrombosis, Hypertension, Hypotension, Myocardial Infarction, Peripheral Arterial Disease, Peripheral Venous Disease, Phlebitis, Vasculitis Gastrointestinal Denies history of Cirrhosis , Colitis, Crohnoos, Hepatitis A, Hepatitis B, Hepatitis C Endocrine Denies history  of Type I Diabetes, Type II Diabetes Genitourinary Denies history of End Stage Renal Disease Immunological Denies history of Lupus Erythematosus, Raynaudoos, Scleroderma Integumentary (Skin) Denies history of History of Burn Musculoskeletal Denies history of Gout, Rheumatoid Arthritis, Osteoarthritis, Osteomyelitis Neurologic Patient has history of Neuropathy - right foot intermittant Denies history of Dementia, Quadriplegia, Paraplegia, Seizure Disorder Oncologic Denies history of Received Chemotherapy, Received Radiation Psychiatric Denies history of Anorexia/bulimia, Confinement  Anxiety Hospitalization/Surgery History - c section x2. - left breast lumpectomy. - iandD right ankle with theraskin. Medical A Surgical History Notes nd Constitutional Symptoms (General Health) H/O miscarriage Cardiovascular bradycardia Gastrointestinal cholilithiasis BATSHEVA, BEYAH ([id number]) [id number] Page 13 of 17 Objective Constitutional no acute distress. Vitals Time Taken: 8:52 AM, Height: 67 in, Weight: 134 lbs, BMI: 21, Temperature: 97.9 F, Pulse: 82 bpm, Respiratory Rate: 20 breaths/min, Blood Pressure: 138/75 mmHg. Respiratory Normal work of breathing on room air. General Notes: 08/21/2022: The left lateral ankle wound is larger and more painful today. Both right ankle wounds have a little bit more vital appearance, but they have accumulated more slough. She reports less odor this week. Integumentary (Hair, Skin) Wound #17 status is Open. Original cause of wound was Gradually Appeared. The date acquired was: 10/05/2012. The wound has been in treatment 70 [name]. The wound is located on the Right,Lateral Lower Leg. The wound measures 6.3cm length x 6.3cm width x 0.2cm depth; 31.172cm^2 area and 6.234cm^3 volume. There is Fat Layer (Subcutaneous Tissue) exposed. There is no tunneling or undermining noted. There is a medium amount of purulent drainage noted. The  wound margin is distinct with the outline attached to the wound base. There is small (1-33%) pink granulation within the wound bed. There is a large (67- 100%) amount of necrotic tissue within the wound bed including Adherent Slough. The periwound skin appearance had no abnormalities noted for moisture. The periwound skin appearance exhibited: Scarring. The periwound skin appearance did not exhibit: Ecchymosis. Periwound temperature was noted as No Abnormality. The periwound has tenderness on palpation. Wound #21 status is Open. Original cause of wound was Gradually Appeared. The date acquired was: 06/26/2021. The wound has been in treatment 60 [name]. The wound is located on the Right,Medial Ankle. The wound measures 7cm length x 3.2cm width x 0.1cm depth; 17.593cm^2 area and 1.759cm^3 volume. There is Fat Layer (Subcutaneous Tissue) exposed. There is no tunneling or undermining noted. There is a medium amount of purulent drainage noted. The wound margin is distinct with the outline attached to the wound base. There is medium (34-66%) pink granulation within the wound bed. There is a medium (34-66%) amount of necrotic tissue within the wound bed including Adherent Slough. The periwound skin appearance had no abnormalities noted for moisture. The periwound skin appearance had no abnormalities noted for color. The periwound skin appearance exhibited: Scarring. Periwound temperature was noted as No Abnormality. The periwound has tenderness on palpation. Wound #24 status is Open. Original cause of wound was Gradually Appeared. The date acquired was: 08/06/2022. The wound has been in treatment 1 [name]. The wound is located on the Left,Lateral Ankle. The wound measures 1.4cm length x 2.5cm width x 0.1cm depth; 2.749cm^2 area and 0.275cm^3 volume. There is Fat Layer (Subcutaneous Tissue) exposed. There is no tunneling or undermining noted. There is a medium amount of serosanguineous drainage noted. The wound  margin is distinct with the outline attached to the wound base. There is small (1-33%) pink granulation within the wound bed. There is a large (67-100%) amount of necrotic tissue within the wound bed including Eschar and Adherent Slough. The periwound skin appearance had no abnormalities noted for texture. The periwound skin appearance had no abnormalities noted for moisture. The periwound skin appearance had no abnormalities noted for color. Periwound temperature was noted as No Abnormality. The periwound has tenderness on palpation. 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 There was a Selective/Open Wound Non-Viable Tissue Debridement with a total area of 16 sq cm performed by [name] Maudlin, MD. With the following instrument(s): Curette to remove Non-Viable tissue/material. Material removed includes [facility] after achieving pain control using Lidocaine 4% Topical Solution. No specimens were taken. A time out was conducted at 09:19, prior to the start of the procedure. A Minimum amount of bleeding was controlled with Pressure. The procedure was tolerated well. Post Debridement Measurements: 6.3cm length x 6.3cm width x 0.2cm depth; 6.234cm^3 volume. Character of Wound/Ulcer Post Debridement is improved. Post procedure Diagnosis Wound #17: Same as Pre-Procedure General Notes: scribed for Dr. Celine [name] by [name] Peals, RN. 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 There was a Double Layer Compression Therapy Procedure by [name] Peals, RN. Post procedure Diagnosis Wound #17: Same as Pre-Procedure Wound  #21 Pre-procedure diagnosis of Wound #21 is a [name] Cell Lesion located on the Right,Medial Ankle .Severity of Tissue Pre Debridement is: Fat layer exposed. There was a Selective/Open Wound Non-Viable Tissue Debridement with a total area of 12 sq cm performed by [name] Maudlin, MD. With the following [name] ([id number]) [id number] Page 14 of 17 instrument(s): Curette to remove Non-Viable tissue/material. Material removed includes [facility] after achieving pain control using Lidocaine 4% Topical Solution. No specimens were taken. A time out was conducted at 09:19, prior to the start of the procedure. A Minimum amount of bleeding was controlled with Pressure. The procedure was tolerated well. Post Debridement Measurements: 7cm length x 3.2cm width x 0.1cm depth; 1.759cm^3 volume. Character of Wound/Ulcer Post Debridement is improved. Severity of Tissue Post Debridement is: Fat layer exposed. Post procedure Diagnosis Wound #21: Same as Pre-Procedure General Notes: scribed for Dr. Celine [name] by [name] Peals, RN. Wound #24 Pre-procedure diagnosis of Wound #24 is a [name] Cell Lesion located on the Left,Lateral Ankle . There was a Double Layer Compression Therapy Procedure by [name] Peals, RN. Post procedure Diagnosis Wound #24: Same as Pre-Procedure Plan Follow-up Appointments: Return Appointment in 1 week. - Dr. Celine [name] - room 2 Anesthetic: (In clinic) Topical Lidocaine 4% applied to wound bed Bathing/ Shower/ Hygiene: May shower with protection but do not get wound dressing(s) wet. Protect dressing(s) with water repellant cover (for example, large plastic bag) or a cast cover and may then take shower. Edema Control - Lymphedema / SCD / Other: Elevate legs to the level of the heart or above for 30 minutes daily and/or when sitting for 3-4 times a day throughout the day. Avoid standing for long periods of time. Exercise regularly Additional Orders /  Instructions: Follow Nutritious Diet Juven Shake 1-2 times daily. The following medication(s) was prescribed: lidocaine topical 4 % cream cream topical was prescribed at facility WOUND #17: - Lower Leg Wound Laterality: Right, Lateral Cleanser: Soap and Water 1 x Per Week/30 Days Discharge Instructions: May shower and wash wound with dial antibacterial soap and water prior to dressing change. Cleanser: Wound Cleanser 1 x Per Week/30 Days Discharge Instructions: Cleanse the wound with wound cleanser prior to applying a clean dressing using gauze sponges, not tissue or cotton balls. Peri-Wound Care: Triamcinolone 15 (g) 1 x Per Week/30 Days Discharge Instructions: Use triamcinolone 15 (g) as directed Peri-Wound Care: Sween Lotion (Moisturizing lotion) 1 x  Per Week/30 Days Discharge Instructions: Apply moisturizing lotion as directed Topical: Skintegrity Hydrogel 4 (oz) 1 x Per Week/30 Days Discharge Instructions: Apply hydrogel as directed Prim Dressing: Promogran Prisma Matrix, 4.34 (sq in) (silver collagen) 1 x Per Week/30 Days ary Discharge Instructions: Moisten collagen with saline or hydrogel Secondary Dressing: ABD Pad, 5x9 1 x Per Week/30 Days Discharge Instructions: Apply over primary dressing as directed. Secondary Dressing: CarboFLEX Odor Control Dressing, 4x4 in 1 x Per Week/30 Days Discharge Instructions: Apply over primary dressing as directed. Com pression Wrap: Urgo K2 Lite, two layer compression system, regular 1 x Per Week/30 Days Discharge Instructions: Apply Urgo K2 Lite as directed (alternative to 3 layer compression). WOUND #21: - Ankle Wound Laterality: Right, Medial Cleanser: Soap and Water 1 x Per Week/30 Days Discharge Instructions: May shower and wash wound with dial antibacterial soap and water prior to dressing change. Cleanser: Wound Cleanser 1 x Per Week/30 Days Discharge Instructions: Cleanse the wound with wound cleanser prior to applying a clean dressing  using gauze sponges, not tissue or cotton balls. Peri-Wound Care: Triamcinolone 15 (g) 1 x Per Week/30 Days Discharge Instructions: Use triamcinolone 15 (g) as directed Peri-Wound Care: Sween Lotion (Moisturizing lotion) 1 x Per Week/30 Days Discharge Instructions: Apply moisturizing lotion as directed Topical: Skintegrity Hydrogel 4 (oz) 1 x Per Week/30 Days Discharge Instructions: Apply hydrogel as directed Prim Dressing: Promogran Prisma Matrix, 4.34 (sq in) (silver collagen) 1 x Per Week/30 Days ary Discharge Instructions: Moisten collagen with saline or hydrogel Secondary Dressing: ABD Pad, 5x9 1 x Per Week/30 Days Discharge Instructions: Apply over primary dressing as directed. Secondary Dressing: CarboFLEX Odor Control Dressing, 4x4 in 1 x Per Week/30 Days Discharge Instructions: Apply over primary dressing as directed. Com pression Wrap: Urgo K2 Lite, two layer compression system, regular 1 x Per Week/30 Days Discharge Instructions: Apply Urgo K2 Lite as directed (alternative to 3 layer compression). WOUND #24: - Ankle Wound Laterality: Left, Lateral Cleanser: Soap and Water 1 x Per Week/30 Days Discharge Instructions: May shower and wash wound with dial antibacterial soap and water prior to dressing change. Cleanser: Wound Cleanser 1 x Per Week/30 Days Discharge Instructions: Cleanse the wound with wound cleanser prior to applying a clean dressing using gauze sponges, not tissue or cotton balls. Peri-Wound Care: Triamcinolone 15 (g) 1 x Per Week/30 Days Discharge Instructions: Use triamcinolone 15 (g) as directed Peri-Wound Care: Sween Lotion (Moisturizing lotion) 1 x Per Week/30 Days Discharge Instructions: Apply moisturizing lotion as directed Topical: Skintegrity Hydrogel 4 (oz) 1 x Per Week/30 Days Discharge Instructions: Apply hydrogel as directed Prim Dressing: Promogran Prisma Matrix, 4.34 (sq in) (silver collagen) 1 x Per Week/30 Days ary Discharge Instructions: Moisten  collagen with saline or hydrogel Secondary Dressing: ABD Pad, 5x9 1 x Per Week/30 Days Discharge Instructions: Apply over primary dressing as directed. Secondary Dressing: CarboFLEX Odor Control Dressing, 4x4 in 1 x Per Week/30 Days Discharge Instructions: Apply over primary dressing as directed. Com pression Wrap: Urgo K2 Lite, two layer compression system, regular 1 x Per Week/30 Days [name], [name] ([id number]) [id number] Page 15 of 17 Discharge Instructions: Apply Urgo K2 Lite as directed (alternative to 3 layer compression). 08/21/2022: The left lateral ankle wound is larger and more painful today. Both right ankle wounds have a little bit more vital appearance, but they have accumulated more slough. She reports less odor this week. I debrided slough off of both right ankle wounds. This remains exquisitely painful for her and she was tearful throughout the  procedure. She declined to permit debridement of the left ankle wound. We are really not seeing much improvement at all. I am a little bit at a loss as far as what I can offer her. She does not want to go back to Dr. Jaclynn Guarneri at [facility] saying that nothing ever helps her, no matter where she goes. I asked her how she wanted to proceed. For now, we will continue using the Prisma silver collagen. She did not want the Iodoflex on her left ankle wound as it is too painful for her to have it removed when she returns to clinic so we will put the Prisma on this wound as well. Continue 3 layer equivalent compression. Follow-up in 1 week. Electronic Signature(s) Signed: 08/21/2022 9:57:21 AM By: [name] Maudlin MD FACS Entered By: [name] on 08/21/2022 09:57:20 -------------------------------------------------------------------------------- HxROS Details Patient Name: Date of Service: [name], [name] 08/21/2022 9:15 A M Medical Record Number: [id number] Patient Account Number: [id number] Date of Birth/Sex:  Treating RN: 10/21/1970 (52 y.o. F) Primary Care Provider: Cammie [name] Other Clinician: Referring Provider: Treating Provider/Extender: [name] in Treatment: 56 Information Obtained From Patient Constitutional Symptoms (General Health) Medical History: Past Medical History Notes: H/O miscarriage Eyes Medical History: Negative for: Cataracts; Glaucoma; Optic Neuritis Ear/Nose/Mouth/Throat Medical History: Negative for: Chronic sinus problems/congestion; Middle ear problems Hematologic/Lymphatic Medical History: Positive for: Anemia; [name] Cell Disease Negative for: Hemophilia; Human Immunodeficiency Virus; Lymphedema Respiratory Medical History: Negative for: Aspiration; Asthma; Chronic Obstructive Pulmonary Disease (COPD); Pneumothorax; Sleep Apnea; Tuberculosis Cardiovascular Medical History: Negative for: Angina; Arrhythmia; Congestive Heart Failure; Coronary Artery Disease; Deep Vein Thrombosis; Hypertension; Hypotension; Myocardial Infarction; Peripheral Arterial Disease; Peripheral Venous Disease; Phlebitis; Vasculitis Past Medical History Notes: bradycardia Gastrointestinal Medical History: Negative for: Cirrhosis ; Colitis; Crohns; Hepatitis A; Hepatitis B; Hepatitis C Past Medical History Notes: cholilithiasis Endocrine [name], [name] ([id number]) [id number] Page 16 of 17 Medical History: Negative for: Type I Diabetes; Type II Diabetes Genitourinary Medical History: Negative for: End Stage Renal Disease Immunological Medical History: Negative for: Lupus Erythematosus; Raynauds; Scleroderma Integumentary (Skin) Medical History: Negative for: History of Burn Musculoskeletal Medical History: Negative for: Gout; Rheumatoid Arthritis; Osteoarthritis; Osteomyelitis Neurologic Medical History: Positive for: Neuropathy - right foot intermittant Negative for: Dementia; Quadriplegia; Paraplegia;  Seizure Disorder Oncologic Medical History: Negative for: Received Chemotherapy; Received Radiation Psychiatric Medical History: Negative for: Anorexia/bulimia; Confinement Anxiety Immunizations Pneumococcal Vaccine: Received Pneumococcal Vaccination: No Implantable Devices None Hospitalization / Surgery History Type of Hospitalization/Surgery c section x2 left breast lumpectomy iandD right ankle with theraskin Family and Social History Cancer: No; Diabetes: Yes - Mother; Heart Disease: No; Hereditary Spherocytosis: No; Hypertension: No; Kidney Disease: No; Lung Disease: Yes - Mother; Seizures: No; Stroke: No; Thyroid Problems: No; Tuberculosis: No; Never smoker; Marital Status - Married; Alcohol Use: Never; Drug Use: No History; Caffeine Use: Daily; Financial Concerns: No; Food, Clothing or Shelter Needs: No; Support System Lacking: No; Transportation Concerns: No Electronic Signature(s) Signed: 08/21/2022 10:02:36 AM By: [name] Maudlin MD FACS Entered By: [name] on 08/21/2022 09:53:57 -------------------------------------------------------------------------------- SuperBill Details Patient Name: Date of Service: [name], [name] 08/21/2022 Medical Record Number: [id number] Patient Account Number: [id number] Date of Birth/Sex: Treating RN: [birth year] (52 y.o. 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 Facility Procedures : CPT4 Code: [id number] Description: [phone] - DEBRIDE WOUND 1ST 20 SQ CM OR  < ICD-10 Diagnosis Description L97.818 Non-pressure chronic ulcer of other part of right lower leg with other specified s Modifier: everity Quantity: 1 : CPT4 Code: [id number] Description: [id number] - DEBRIDE WOUND EA ADDL 20 SQ CM ICD-10 Diagnosis Description L97.818 Non-pressure chronic ulcer of other part of right lower leg with other specified s Modifier: everity Quantity: 1 Physician Procedures : CPT4 Code Description Modifier [id number] - WC PHYS LEVEL 3 - EST PT 25 ICD-10 Diagnosis Description L97.818 Non-pressure chronic ulcer of other part of right lower leg with other specified severity L97.822 Non-pressure chronic ulcer of other part  of left lower leg with fat layer exposed D57.1 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

## 2022-08-28 ENCOUNTER — Encounter (HOSPITAL_BASED_OUTPATIENT_CLINIC_OR_DEPARTMENT_OTHER): Payer: Medicaid Other | Admitting: General Surgery

## 2022-08-28 DIAGNOSIS — L97818 Non-pressure chronic ulcer of other part of right lower leg with other specified severity: Secondary | ICD-10-CM | POA: Diagnosis not present

## 2022-09-03 ENCOUNTER — Telehealth: Payer: Self-pay | Admitting: Family Medicine

## 2022-09-03 ENCOUNTER — Other Ambulatory Visit: Payer: Self-pay | Admitting: Family Medicine

## 2022-09-03 DIAGNOSIS — G894 Chronic pain syndrome: Secondary | ICD-10-CM

## 2022-09-03 MED ORDER — OXYCODONE HCL 10 MG PO TABS: 10.0000 mg | ORAL_TABLET | ORAL | 0 refills | Status: DC

## 2022-09-03 NOTE — Telephone Encounter (Signed)
Caller & Relationship to patient:  MRN #  [id number]   Call Back Number:   Date of Last Office Visit: 08/09/2022     Date of Next Office Visit: 09/24/2022    Medication(s) to be Refilled: Oxycodone 10 mg  Preferred Pharmacy:   ** Please notify patient to allow 48-72 hours to process** **Let patient know to contact pharmacy at the end of the day to make sure medication is ready. ** **If patient has not been seen in a year or longer, book an appointment **Advise to use MyChart for refill requests OR to contact their pharmacy

## 2022-09-03 NOTE — Progress Notes (Signed)
Reviewed PDMP substance reporting system prior to prescribing opiate medications. No inconsistencies noted.    Meds ordered this encounter  Medications   Oxycodone HCl 10 MG TABS    Sig: Take 1 tablet (10 mg total) by mouth every 4 (four) hours while awake.    Dispense:  90 tablet    Refill:  0    Order Specific Question:   Supervising Provider    Answer:   JEGEDE, OLUGBEMIGA E [[id number]]    [name] [name] [name]  APRN, MSN, FNP-C [facility] [address]  [place], [place] [address] [phone]  

## 2022-09-05 NOTE — Progress Notes (Signed)
QUINESHA, SELINGER ([id number]) [id number] Page 1 of 10 Visit Report for 08/28/2022 Arrival Information Details Patient Name: Date of Service: [name] [name] 08/28/2022 9:15 A M Medical Record Number: [id number] Patient Account Number: [id number] Date of Birth/Sex: Treating RN: 1970-09-13 (52 y.o. [name] Primary Care Kemora Pinard: Julianne Handler Other Clinician: Referring Shantel Helwig: Treating Sahaj Bona/Extender: Koleen Nimrod Weeks in Treatment: 28 Visit Information History Since Last Visit All ordered tests and consults were completed: Yes Patient Arrived: [name] Added or deleted any medications: No Arrival Time: 09:13 Any new allergies or adverse reactions: No Accompanied By: self Had a fall or experienced change in No Transfer Assistance: None activities of daily living that may affect Patient Identification Verified: Yes risk of falls: Secondary Verification Process Completed: Yes Signs or symptoms of abuse/neglect since last visito No Patient Requires Transmission-Based Precautions: No Hospitalized since last visit: No Patient Has Alerts: No Pain Present Now: Yes Electronic Signature(s) Signed: 09/05/2022 1:56:54 PM By: Brenton Grills Entered By: Brenton Grills on 08/28/2022 09:14:53 -------------------------------------------------------------------------------- Encounter Discharge Information Details Patient Name: Date of Service: [name], [name] 08/28/2022 9:15 A M Medical Record Number: [id number] Patient Account Number: [id number] Date of Birth/Sex: Treating RN: [birth year] (52 y.o. [name] Primary Care Paisyn Guercio: Julianne Handler Other Clinician: Referring Samiha Denapoli: Treating Teven Mittman/Extender: Koleen Nimrod Weeks in Treatment: 47 Encounter Discharge Information Items Post Procedure Vitals Discharge Condition: Stable Temperature (F): 98.6 Ambulatory Status: Cane Pulse (bpm):  105 Discharge Destination: Home Respiratory Rate (breaths/min): 18 Transportation: Private Auto Blood Pressure (mmHg): 134/56 Accompanied By: self Schedule Follow-up Appointment: Yes Clinical Summary of Care: Patient Declined Electronic Signature(s) Signed: 09/05/2022 1:56:54 PM By: Brenton Grills Entered By: Brenton Grills on 08/28/2022 09:48:19 -------------------------------------------------------------------------------- Lower Extremity Assessment Details Patient Name: Date of Service: [name], [name] 08/28/2022 9:15 A M Medical Record Number: [id number] Patient Account Number: [id number] Date of Birth/Sex: Treating RN: [birth year] (52 y.o. [name] Primary Care [name]: Julianne Handler Other Clinician: Referring Damoney Julia: Treating Ashford Clouse/Extender: Koleen Nimrod Weeks in Treatment: 74 Edema Assessment Assessed: [Left: No] [Right: No] Edema: [Left: Ye] [Right: s] Calf [name] ([id number]) [id number] Page 2 of 10 Left: Right: Point of Measurement: 33 cm From Medial Instep 25 cm 25.5 cm Ankle Left: Right: Point of Measurement: 10 cm From Medial Instep 17 cm 19 cm Vascular Assessment Pulses: Dorsalis Pedis Palpable: [Left:Yes] [Right:Yes] Electronic Signature(s) Signed: 09/05/2022 1:56:54 PM By: Brenton Grills Entered By: Brenton Grills on 08/28/2022 09:20:29 -------------------------------------------------------------------------------- Multi Wound Chart Details Patient Name: Date of Service: [name], [name] 08/28/2022 9:15 A M Medical Record Number: [id number] Patient Account Number: [id number] Date of Birth/Sex: Treating RN: 11/18/1970 (52 y.o. F) Primary Care [name]: Julianne Handler Other Clinician: Referring Victoriah Wilds: Treating Norleen Xie/Extender: Koleen Nimrod Weeks in Treatment: 27 Vital Signs Height(in): 67 Pulse(bpm): 105 Weight(lbs): 134 Blood Pressure(mmHg):  134/56 Body Mass Index(BMI): 21 Temperature(F): 98.6 Respiratory Rate(breaths/min): 18 [17:Photos:] Right, Lateral Lower Leg Right, Medial Ankle Left, Lateral Ankle Wound Location: Gradually Appeared Gradually Appeared Gradually Appeared Wounding Event: Sickle Cell Lesion Sickle Cell Lesion Sickle Cell Lesion Primary Etiology: N/A Venous Leg Ulcer N/A Secondary Etiology: Anemia, Sickle Cell Disease, Anemia, Sickle Cell Disease, Anemia, Sickle Cell Disease, Comorbid History: Neuropathy Neuropathy Neuropathy 10/05/2012 06/26/2021 08/06/2022 Date Acquired: 72 61 2 Weeks of Treatment: Open Open Open Wound Status: No No No Wound Recurrence: Yes No No Clustered Wound: 6.5x5x0.1 7x3.2x0.1 1.5x2.5x0.3 Measurements L x W x D (cm) 25.525 17.593 2.945 A (cm) :  rea 2.553 1.759 0.884 Volume (cm) : 86.40% 39.30% -127.20% % Reduction in Area: 86.40% 39.30% -580.00% % Reduction in Volume: Full Thickness Without Exposed Full Thickness Without Exposed Full Thickness Without Exposed Classification: Support Structures Support Structures Support Structures Medium Medium Medium Exudate A mount: Purulent Purulent Serosanguineous Exudate Type: yellow, brown, green yellow, brown, green red, brown Exudate Color: Distinct, outline attached Distinct, outline attached Distinct, outline attached Wound Margin: Medium (34-66%) Medium (34-66%) Small (1-33%) Granulation Amount: Pink Pink Pink Granulation Quality: Medium (34-66%) Medium (34-66%) Large (67-100%) Necrotic Amount: Adherent Slough Adherent Slough Eschar, Adherent Slough Necrotic Tissue: Fat Layer (Subcutaneous Tissue): Yes Fat Layer (Subcutaneous Tissue): Yes Fat Layer (Subcutaneous Tissue): Yes Exposed Structures: Fascia: No Fascia: No Fascia: No [name] ([id number]) [id number] Page 3 of 10 Tendon: No Tendon: No Tendon: No Muscle: No Muscle: No Muscle: No Joint: No Joint: No Joint:  No Bone: No Bone: No Bone: No Small (1-33%) Small (1-33%) None Epithelialization: Debridement - Selective/Open Wound Debridement - Selective/Open Wound N/A Debridement: Pre-procedure Verification/Time Out 09:38 09:38 N/A Taken: Lidocaine 4% Topical Solution Lidocaine 4% Topical Solution N/A Pain Control: Necrotic/Eschar, Ambulance person, Slough N/A Tissue Debrided: Non-Viable Tissue Non-Viable Tissue N/A Level: 12 22.4 N/A Debridement A (sq cm): rea Curette Curette N/A Instrument: Minimum Minimum N/A Bleeding: Pressure Pressure N/A Hemostasis A chieved: 0 0 N/A Procedural Pain: 0 0 N/A Post Procedural Pain: Procedure was tolerated well Procedure was tolerated well N/A Debridement Treatment Response: 6.5x0.5x0.1 7x3.2x0.1 N/A Post Debridement Measurements L x W x D (cm) 0.255 1.759 N/A Post Debridement Volume: (cm) Scarring: Yes Scarring: Yes No Abnormalities Noted Periwound Skin Texture: No Abnormalities Noted No Abnormalities Noted No Abnormalities Noted Periwound Skin Moisture: Ecchymosis: No No Abnormalities Noted No Abnormalities Noted Periwound Skin Color: No Abnormality No Abnormality No Abnormality Temperature: Yes Yes Yes Tenderness on Palpation: Debridement Debridement N/A Procedures Performed: Treatment Notes Electronic Signature(s) Signed: 08/28/2022 9:43:56 AM By: Duanne Guess MD FACS Entered By: Duanne Guess on 08/28/2022 09:43:55 -------------------------------------------------------------------------------- Multi-Disciplinary Care Plan Details Patient Name: Date of Service: [name] URO Annia Belt, [name] 08/28/2022 9:15 A M Medical Record Number: [id number] Patient Account Number: [id number] Date of Birth/Sex: Treating RN: 1971-01-19 (52 y.o. [name] Primary Care Kiyoto Slomski: Julianne Handler Other Clinician: Referring Jamillia Closson: Treating Jennelle Pinkstaff/Extender: Koleen Nimrod Weeks in Treatment: 4 Multidisciplinary  Care Plan reviewed with physician Active Inactive Venous Leg Ulcer Nursing Diagnoses: Actual venous Insuffiency (use after diagnosis is confirmed) Knowledge deficit related to disease process and management Goals: Patient will maintain optimal edema control Date Initiated: 06/26/2021 Target Resolution Date: 09/20/2022 Goal Status: Active Interventions: Assess peripheral edema status every visit. Compression as ordered Treatment Activities: Therapeutic compression applied : 06/26/2021 Notes: Wound/Skin Impairment Nursing Diagnoses: Impaired tissue integrity GoalsKINZEY, SHERIFF ([id number]) [id number] Page 4 of 10 Patient/caregiver will verbalize understanding of skin care regimen Date Initiated: 04/17/2021 Target Resolution Date: 09/20/2022 Goal Status: Active Ulcer/skin breakdown will have a volume reduction of 30% by week 4 Date Initiated: 04/17/2021 Date Inactivated: 05/29/2021 Target Resolution Date: 05/15/2021 Goal Status: Met Ulcer/skin breakdown will have a volume reduction of 50% by week 8 Date Initiated: 05/29/2021 Date Inactivated: 06/26/2021 Target Resolution Date: 06/26/2021 Goal Status: Unmet Unmet Reason: venous reflux Interventions: Assess patient/caregiver ability to obtain necessary supplies Assess patient/caregiver ability to perform ulcer/skin care regimen upon admission and as needed Assess ulceration(s) every visit Provide education on ulcer and skin care Treatment Activities: Topical wound management initiated : 04/17/2021 Notes: 06/08/21: Left leg wounds greater than 30% volume reduction,  right leg acute infection. Electronic Signature(s) Signed: 09/05/2022 1:56:54 PM By: Brenton Grills Entered By: Brenton Grills on 08/28/2022 09:28:34 -------------------------------------------------------------------------------- Pain Assessment Details Patient Name: Date of Service: [name], [name] 08/28/2022 9:15 A M Medical Record Number:  [id number] Patient Account Number: [id number] Date of Birth/Sex: Treating RN: 1971/03/05 (52 y.o. [name] Primary Care Amadi Frady: Julianne Handler Other Clinician: Referring Tucker Steedley: Treating Pegge Cumberledge/Extender: Koleen Nimrod Weeks in Treatment: 41 Active Problems Location of Pain Severity and Description of Pain Patient Has Paino No Site Locations Duration of the Pain. Constant / Intermittento Constant Rate the pain. Current Pain Level: 6 Worst Pain Level: 6 Least Pain Level: 6 Character of Pain Describe the Pain: Aching Pain Management and Medication Current Pain Management: Medication: Yes Notes Pt. reports takin Oxycodone with relief obtained. Electronic Signature(s) Signed: 09/05/2022 1:56:54 PM By: Wyatt Haste, Felecia Shelling ([id number]) [id number] Page 5 of 10 Entered By: Brenton Grills on 08/28/2022 09:17:31 -------------------------------------------------------------------------------- Patient/Caregiver Education Details Patient Name: Date of Service: [name] URO Annia Belt, [place] [name] 4/10/2024andnbsp9:15 A M Medical Record Number: [id number] Patient Account Number: [id number] Date of Birth/Gender: Treating RN: 01-04-1971 (52 y.o. [name] Primary Care Physician: Julianne Handler Other Clinician: Referring Physician: Treating Physician/Extender: Koleen Nimrod Weeks in Treatment: 67 Education Assessment Education Provided To: Patient Education Topics Provided Wound/Skin Impairment: Methods: Explain/Verbal Responses: State content correctly Electronic Signature(s) Signed: 09/05/2022 1:56:54 PM By: Brenton Grills Entered By: Brenton Grills on 08/28/2022 09:28:56 -------------------------------------------------------------------------------- Wound Assessment Details Patient Name: Date of Service: [name], [name] 08/28/2022 9:15 A M Medical Record Number: [id number] Patient Account Number:  [id number] Date of Birth/Sex: Treating RN: 12-Nov-1970 (52 y.o. [name] Primary Care Kamarian Sahakian: Julianne Handler Other Clinician: Referring Celisse Ciulla: Treating Misha Vanoverbeke/Extender: Koleen Nimrod Weeks in Treatment: 2 Wound Status Wound Number: 17 Primary Etiology: Sickle Cell Lesion Wound Location: Right, Lateral Lower Leg Wound Status: Open Wounding Event: Gradually Appeared Comorbid History: Anemia, Sickle Cell Disease, Neuropathy Date Acquired: 10/05/2012 Weeks Of Treatment: 71 Clustered Wound: Yes Photos Wound Measurements Length: (cm) 6.5 Width: (cm) 5 Depth: (cm) 0.1 Area: (cm) 25.525 Volume: (cm) 2.553 % Reduction in Area: 86.4% % Reduction in Volume: 86.4% Epithelialization: Small (1-33%) Tunneling: No Undermining: No Wound Description Classification: Full Thickness Without Exposed Support Structures Clayton, Felecia Shelling ([id number]) Wound Margin: Distinct, outline attached Exudate Amount: Medium Exudate Type: Purulent Exudate Color: yellow, brown, green Foul Odor After Cleansing: No [id number] Page 6 of 10 Slough/Fibrino Yes Wound Bed Granulation Amount: Medium (34-66%) Exposed Structure Granulation Quality: Pink Fascia Exposed: No Necrotic Amount: Medium (34-66%) Fat Layer (Subcutaneous Tissue) Exposed: Yes Necrotic Quality: Adherent Slough Tendon Exposed: No Muscle Exposed: No Joint Exposed: No Bone Exposed: No Periwound Skin Texture Texture Color No Abnormalities Noted: No No Abnormalities Noted: No Scarring: Yes Ecchymosis: No Moisture Temperature / Pain No Abnormalities Noted: Yes Temperature: No Abnormality Tenderness on Palpation: Yes Treatment Notes Wound #17 (Lower Leg) Wound Laterality: Right, Lateral Cleanser Soap and Water Discharge Instruction: May shower and wash wound with dial antibacterial soap and water prior to dressing change. Wound Cleanser Discharge Instruction: Cleanse the  wound with wound cleanser prior to applying a clean dressing using gauze sponges, not tissue or cotton balls. Peri-Wound Care Triamcinolone 15 (g) Discharge Instruction: Use triamcinolone 15 (g) as directed Sween Lotion (Moisturizing lotion) Discharge Instruction: Apply moisturizing lotion as directed Topical Primary Dressing Santyl Ointment Discharge Instruction: Apply nickel thick amount to wound bed as instructed Secondary Dressing ABD Pad, 5x9 Discharge Instruction: Apply  over primary dressing as directed. Woven Gauze Sponge, Non-Sterile 4x4 in Discharge Instruction: Moisten with normal saline and Apply over primary dressing as directed. Secured With Elastic Bandage 4 inch (ACE bandage) Discharge Instruction: Secure with ACE bandage as directed. Kerlix Roll Sterile, 4.5x3.1 (in/yd) Discharge Instruction: Secure with Kerlix as directed. Compression Wrap Compression Stockings Add-Ons Electronic Signature(s) Signed: 08/28/2022 2:02:41 PM By: Samuella Bruin Signed: 09/05/2022 1:56:54 PM By: Brenton Grills Entered By: Samuella Bruin on 08/28/2022 09:27:30 -------------------------------------------------------------------------------- Wound Assessment Details Patient Name: Date of Service: [name], [name] 08/28/2022 9:15 A Stana Bunting ([id number]) [id number] Page 7 of 10 Medical Record Number: [id number] Patient Account Number: [id number] Date of Birth/Sex: Treating RN: [birth year] (52 y.o. [name] Primary Care Waldo Damian: Julianne Handler Other Clinician: Referring Kevaughn Ewing: Treating Lilyian Quayle/Extender: Koleen Nimrod Weeks in Treatment: 41 Wound Status Wound Number: 21 Primary Etiology: Sickle Cell Lesion Wound Location: Right, Medial Ankle Secondary Etiology: Venous Leg Ulcer Wounding Event: Gradually Appeared Wound Status: Open Date Acquired: 06/26/2021 Comorbid History: Anemia, Sickle Cell Disease,  Neuropathy Weeks Of Treatment: 61 Clustered Wound: No Photos Wound Measurements Length: (cm) 7 Width: (cm) 3.2 Depth: (cm) 0.1 Area: (cm) 17.593 Volume: (cm) 1.759 % Reduction in Area: 39.3% % Reduction in Volume: 39.3% Epithelialization: Small (1-33%) Tunneling: No Undermining: No Wound Description Classification: Full Thickness Without Exposed Support Structures Wound Margin: Distinct, outline attached Exudate Amount: Medium Exudate Type: Purulent Exudate Color: yellow, brown, green Foul Odor After Cleansing: No Slough/Fibrino Yes Wound Bed Granulation Amount: Medium (34-66%) Exposed Structure Granulation Quality: Pink Fascia Exposed: No Necrotic Amount: Medium (34-66%) Fat Layer (Subcutaneous Tissue) Exposed: Yes Necrotic Quality: Adherent Slough Tendon Exposed: No Muscle Exposed: No Joint Exposed: No Bone Exposed: No Periwound Skin Texture Texture Color No Abnormalities Noted: No No Abnormalities Noted: Yes Scarring: Yes Temperature / Pain Temperature: No Abnormality Moisture No Abnormalities Noted: Yes Tenderness on Palpation: Yes Treatment Notes Wound #21 (Ankle) Wound Laterality: Right, Medial Cleanser Soap and Water Discharge Instruction: May shower and wash wound with dial antibacterial soap and water prior to dressing change. Wound Cleanser Discharge Instruction: Cleanse the wound with wound cleanser prior to applying a clean dressing using gauze sponges, not tissue or cotton balls. Peri-Wound Care Triamcinolone 15 (g) Discharge Instruction: Use triamcinolone 15 (g) as directed Sween Lotion (Moisturizing lotion) [name] ([id number]) [id number] Page 8 of 10 Discharge Instruction: Apply moisturizing lotion as directed Topical Primary Dressing Santyl Ointment Discharge Instruction: Apply nickel thick amount to wound bed as instructed Secondary Dressing ABD Pad, 5x9 Discharge Instruction: Apply over primary  dressing as directed. Woven Gauze Sponge, Non-Sterile 4x4 in Discharge Instruction: Moisten with normal saline and Apply over primary dressing as directed. Secured With Elastic Bandage 4 inch (ACE bandage) Discharge Instruction: Secure with ACE bandage as directed. Kerlix Roll Sterile, 4.5x3.1 (in/yd) Discharge Instruction: Secure with Kerlix as directed. Compression Wrap Compression Stockings Add-Ons Electronic Signature(s) Signed: 08/28/2022 2:02:41 PM By: Samuella Bruin Signed: 09/05/2022 1:56:54 PM By: Brenton Grills Entered By: Samuella Bruin on 08/28/2022 09:28:04 -------------------------------------------------------------------------------- Wound Assessment Details Patient Name: Date of Service: [name], [name] 08/28/2022 9:15 A M Medical Record Number: [id number] Patient Account Number: [id number] Date of Birth/Sex: Treating RN: 19-Jan-1971 (52 y.o. [name] Primary Care Madai Nuccio: Julianne Handler Other Clinician: Referring Keldrick Pomplun: Treating Gayle Collard/Extender: Koleen Nimrod Weeks in Treatment: 41 Wound Status Wound Number: 24 Primary Etiology: Sickle Cell Lesion Wound Location: Left, Lateral Ankle Wound Status: Open Wounding Event: Gradually Appeared Comorbid History: Anemia, Sickle Cell  Disease, Neuropathy Date Acquired: 08/06/2022 Weeks Of Treatment: 2 Clustered Wound: No Photos Wound Measurements Length: (cm) 1.5 Width: (cm) 2.5 Depth: (cm) 0.3 Area: (cm) 2.945 Volume: (cm) 0.884 % Reduction in Area: -127.2% % Reduction in Volume: -580% Epithelialization: None Tunneling: No Undermining: No Wound Description Classification: Full Thickness Without Exposed Support Structures Mylo, Felecia Shelling ([id number]) Wound Margin: Distinct, outline attached Exudate Amount: Medium Exudate Type: Serosanguineous Exudate Color: red, brown Foul Odor After Cleansing: No [id number] Page 9 of 10 Slough/Fibrino  Yes Wound Bed Granulation Amount: Small (1-33%) Exposed Structure Granulation Quality: Pink Fascia Exposed: No Necrotic Amount: Large (67-100%) Fat Layer (Subcutaneous Tissue) Exposed: Yes Necrotic Quality: Eschar, Adherent Slough Tendon Exposed: No Muscle Exposed: No Joint Exposed: No Bone Exposed: No Periwound Skin Texture Texture Color No Abnormalities Noted: Yes No Abnormalities Noted: Yes Moisture Temperature / Pain No Abnormalities Noted: Yes Temperature: No Abnormality Tenderness on Palpation: Yes Treatment Notes Wound #24 (Ankle) Wound Laterality: Left, Lateral Cleanser Soap and Water Discharge Instruction: May shower and wash wound with dial antibacterial soap and water prior to dressing change. Wound Cleanser Discharge Instruction: Cleanse the wound with wound cleanser prior to applying a clean dressing using gauze sponges, not tissue or cotton balls. Peri-Wound Care Triamcinolone 15 (g) Discharge Instruction: Use triamcinolone 15 (g) as directed Sween Lotion (Moisturizing lotion) Discharge Instruction: Apply moisturizing lotion as directed Topical Primary Dressing Santyl Ointment Discharge Instruction: Apply nickel thick amount to wound bed as instructed Secondary Dressing ABD Pad, 5x9 Discharge Instruction: Apply over primary dressing as directed. Woven Gauze Sponge, Non-Sterile 4x4 in Discharge Instruction: Moisten with normal saline and Apply over primary dressing as directed. Secured With Elastic Bandage 4 inch (ACE bandage) Discharge Instruction: Secure with ACE bandage as directed. Kerlix Roll Sterile, 4.5x3.1 (in/yd) Discharge Instruction: Secure with Kerlix as directed. Compression Wrap Compression Stockings Add-Ons Electronic Signature(s) Signed: 08/28/2022 2:02:41 PM By: Samuella Bruin Signed: 09/05/2022 1:56:54 PM By: Brenton Grills Entered By: Samuella Bruin on 08/28/2022  09:28:26 -------------------------------------------------------------------------------- Vitals Details Patient Name: Date of Service: [name] URO Annia Belt, [name] 08/28/2022 9:15 A M Medical Record Number: [id number] Patient Account Number: [id number] MILKA, WINDHOLZ ([id number]) [id number] Page 10 of 10 Date of Birth/Sex: Treating RN: 07/12/1970 (52 y.o. [name] Primary Care Brantleigh Mifflin: Other Clinician: Julianne Handler Referring Lamir Racca: Treating Skippy Marhefka/Extender: Koleen Nimrod Weeks in Treatment: 40 Vital Signs Time Taken: 09:14 Temperature (F): 98.6 Height (in): 67 Pulse (bpm): 105 Weight (lbs): 134 Respiratory Rate (breaths/min): 18 Body Mass Index (BMI): 21 Blood Pressure (mmHg): 134/56 Reference Range: 80 - 120 mg / dl Electronic Signature(s) Signed: 09/05/2022 1:56:54 PM By: Brenton Grills Entered By: Brenton Grills on 08/28/2022 09:16:15

## 2022-09-05 NOTE — Progress Notes (Signed)
[name], [name] ([id number]) [id number] Page 1 of 17 Visit Report for 08/28/2022 Chief Complaint Document Details Patient Name: Date of Service: Thedore [name] [name] 08/28/2022 9:15 A M Medical Record Number: [id number] Patient Account Number: [id number] Date of Birth/Sex: Treating RN: Nov 24, 1970 (52 y.o. F) Primary Care Provider: Julianne Handler Other Clinician: Referring Provider: Treating Provider/Extender: Koleen Nimrod Weeks in Treatment: 8 Information Obtained from: Patient Chief Complaint the patient is here for evaluation of her bilateral lower extremity sickle cell ulcers 04/17/2021; patient comes in for substantial wounds on the right and left lower leg Electronic Signature(s) Signed: 08/28/2022 9:44:04 AM By: Duanne Guess MD FACS Entered By: Duanne Guess on 08/28/2022 09:44:04 -------------------------------------------------------------------------------- Debridement Details Patient Name: Date of Service: [name], [name] 08/28/2022 9:15 A M Medical Record Number: [id number] Patient Account Number: [id number] Date of Birth/Sex: Treating RN: 03-03-1971 (52 y.o. [name] Primary Care Provider: Julianne Handler Other Clinician: Referring Provider: Treating Provider/Extender: Koleen Nimrod Weeks in Treatment: 15 Debridement Performed for Assessment: Wound #17 Right,Lateral Lower Leg Performed By: Physician Duanne Guess, MD Debridement Type: Debridement Level of Consciousness (Pre-procedure): Awake and Alert Pre-procedure Verification/Time Out Yes - 09:38 Taken: Start Time: 09:39 Pain Control: Lidocaine 4% Topical Solution T Area Debrided (L x W): otal 4 (cm) x 3 (cm) = 12 (cm) Tissue and other material debrided: Non-Viable, Eschar, Slough, Slough Level: Non-Viable Tissue Debridement Description: Selective/Open Wound Instrument: Curette Bleeding: Minimum Hemostasis Achieved:  Pressure End Time: 09:40 Procedural Pain: 0 Post Procedural Pain: 0 Response to Treatment: Procedure was tolerated well Level of Consciousness (Post- Awake and Alert procedure): Post Debridement Measurements of Total Wound Length: (cm) 6.5 Width: (cm) 0.5 Depth: (cm) 0.1 Volume: (cm) 0.255 Character of Wound/Ulcer Post Debridement: Improved Post Procedure Diagnosis Same as Pre-procedure Notes Scribed for Dr. Lady Gary by Brenton Grills RN. Electronic Signature(s) Signed: 08/28/2022 10:16:59 AM By: Duanne Guess MD FACS [place], Felecia Shelling ([id number]) [id number] Page 2 of 17 Signed: 09/05/2022 1:56:54 PM By: Brenton Grills Entered By: Brenton Grills on 08/28/2022 09:40:23 -------------------------------------------------------------------------------- Debridement Details Patient Name: Date of Service: [name], [name] 08/28/2022 9:15 A M Medical Record Number: [id number] Patient Account Number: [id number] Date of Birth/Sex: Treating RN: 01-30-1971 (52 y.o. [name] Primary Care Provider: Julianne Handler Other Clinician: Referring Provider: Treating Provider/Extender: Koleen Nimrod Weeks in Treatment: 78 Debridement Performed for Assessment: Wound #21 Right,Medial Ankle Performed By: Physician Duanne Guess, MD Debridement Type: Debridement Severity of Tissue Pre Debridement: Fat layer exposed Level of Consciousness (Pre-procedure): Awake and Alert Pre-procedure Verification/Time Out Yes - 09:38 Taken: Start Time: 09:39 Pain Control: Lidocaine 4% T opical Solution T Area Debrided (L x W): otal 7 (cm) x 3.2 (cm) = 22.4 (cm) Tissue and other material debrided: Non-Viable, Eschar, Slough, Slough Level: Non-Viable Tissue Debridement Description: Selective/Open Wound Instrument: Curette Bleeding: Minimum Hemostasis Achieved: Pressure End Time: 09:40 Procedural Pain: 0 Post Procedural Pain: 0 Response to Treatment:  Procedure was tolerated well Level of Consciousness (Post- Awake and Alert procedure): Post Debridement Measurements of Total Wound Length: (cm) 7 Width: (cm) 3.2 Depth: (cm) 0.1 Volume: (cm) 1.759 Character of Wound/Ulcer Post Debridement: Improved Severity of Tissue Post Debridement: Fat layer exposed Post Procedure Diagnosis Same as Pre-procedure Notes Scribed for Dr. Lady Gary by Brenton Grills RN. Electronic Signature(s) Signed: 08/28/2022 10:16:59 AM By: Duanne Guess MD FACS Signed: 09/05/2022 1:56:54 PM By: Brenton Grills Entered By: Brenton Grills on 08/28/2022 09:41:46 -------------------------------------------------------------------------------- HPI Details Patient Name: Date of Service: [name]  UMA, [name] 08/28/2022 9:15 A M Medical Record Number: [id number] Patient Account Number: [id number] Date of Birth/Sex: Treating RN: 29-Aug-1970 (52 y.o. F) Primary Care Provider: Julianne Handler Other Clinician: Referring Provider: Treating Provider/Extender: Koleen Nimrod Weeks in Treatment: 93 History of Present Illness Location: medial and lateral ankle region on the right and left medial malleolus Quality: Patient reports experiencing a shooting pain to affected area(s). Severity: Patient states wound(s) are getting worse. Duration: right lower extremity bimalleolar ulcers have been present for approximately 2 years; the rright meedial malleolus ulcer has been there proximally 6 months Timing: Pain in wound is constant (hurts all the time) [name], [name] ([id number]) [id number] Page 3 of 17 Context: The wound would happen gradually ssociated Signs and Symptoms: Patient reports having increase discharge. A HPI Description: 52 year old patient with a history of sickle cell anemia who was last seen by me with ulceration of the right lower extremity above the ankle and was referred to Dr. Marzetta Board for a surgical debridement as I  was unable to do anything in the office due to excruciating pain. At that stage she was referred from the plastic surgery service to dermatology who treated her for a skin infection with doxycycline and then Levaquin and a local antibiotic ointment. I understand the patient has since developed ulceration on the left ankle both medial and lateral and was now referred back to the wound center as dermatology has finished the management. I do not have any notes from the dermatology department Old notes: 52 year old patient with a history of sickle cell anemia, pain bilateral lower extremities, right lower extremity ulcer and has a history of receiving a skin graft( Theraskin) several months ago. She has been visiting the wound center [facility] and was seen by Dr. Leanord Hawking and Dr. Marzetta Board. after prolonged conservator therapy between July 2016 and January 2017. She had been seen by the plastic surgeon and taken to the OR for debridement and application of Theraskin. She had 3 applications of Theraskin and was then treated with collagen. Prior to that she had a history of similar problems in 2014 and was treated conservatively. Had a reflux study done for the right lower extremity in August 2016 without reflux or DVT . Past medical history significant for sickle cell disease, anemia, leg ulcers, cholelithiasis,and has never been a smoker. Once the patient was discharged on the wound center she says within 2 or 3 weeks the problems recurred and she has been treating it conservatively. since I saw her 3 weeks ago at [facility] she has been unable to get her dressing material but has completed a course of doxycycline. 6/7/ 2017 -- lower extremity venous duplex reflux evaluation was done No evidence of SVT or DVT in the RLL. No venous incompetence in the RLL. No further vascular workup is indicated at this time. She was seen by Dr. Mina Marble, on 10/04/2015. She agreed with the plan of taking her to the  OR for debridement and application of theraskin and would also take biopsies to rule out pyoderma gangrenosum. Follow-up note dated May 31 received and she was status post application of Theraskin to multiple ulcers around the right ankle. Pathology did not show evidence of malignancy or pyoderma gangrenosum. She would continue to see as in the wound clinic for further care and see Dr. Marzetta Board as needed. The patient brought the biopsy report and it was consistent with stasis ulcer no evidence of malignancy and the comment was that there was some adjacent neovascularization,  fibrosis and patchy perivascular chronic inflammation. 11/15/2015 -- today we applied her first application of Theraskin 11/30/15; TheraSkin #2 12/13/2015 -- she is having a lot of pain locally and is here for possible application of a theraskin today. 01/16/2016 -- the patient has significant pain and has noticed despite in spite of all local care and oral pain medication. It is impossible to debride her in the office. 02/06/2016 -- I do not see any notes from Dr. Leta Baptist( the patient has not made a call to the office know as she heard from them) and the only visit to recently was with her PCP Dr. Gypsy Decant -- I saw her on 01/16/2016 and prescribed 90 tablets of oxycodone 10 mg and did lab work and screening for HIV. the HIV was negative and hemoglobin was 6.3 with a WBC count of 14.9 and hematocrit of 17.8 with platelets of 561. 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 The bimalleolar ulcers to the right lower extremity have been present for approximately 2 years, the left medial malleolus ulcer has been present approximately 6 months. She has followed with Dr.Thimmappa in the past and has had a total of 3 applications of Theraskin (01/2015, 09/2015, 06/17/16). She has also followed with Dr. Meyer Russel here in  the clinic and has received 2 applications of TheraSkin (11/10/15, 11/30/15). The patient does experience chronic, and is not amenable to debridement. 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 She is not currently on any antibiotic therapy and has not been treated with any recently. 07/17/2016 -- was seen by Dr. Leta Baptist of plastic surgery who saw her 2 weeks postop application of Theraskin #3. She had removed her dressing and asked her to apply silver alginate on alternate days and follow-up back with the wound center. Future debridements and application of skin substitute would have to be done in the hospital due to her high risk for anesthesia. READMISSION 04/17/2021 Patient is now a 52 year old woman that we have had in this clinic for a prolonged period of time and 2016-2017 and then again for 2 visits in February 2018. At that point she had wounds on the right lower leg predominantly medial. She had also been seen by plastic surgery Dr. Marzetta Board who I believe took her to the OR for operative debridement and application of TheraSkin in 2017. After she left our clinic she was followed for a very prolonged period of time in the wound care center in [facility] who then referred her ultimately to [facility] where she was seen by Dr. Mardene Speak. Again taken her to the OR for skin grafting which apparently did not take. She had multiple other attempts at dressings although I have not really looked over all of these notes in great detail. She has not been seen in a wound care center in about a year. She states over the last year in addition to her right lower leg she has developed wounds on the left lower leg quite extensive. She is using Xeroform to all of these wounds without really any improvement. She also has Medicaid which does not cover wound products. The patient has had vascular work-ups in the past including most recently on 03/28/2021 showing biphasic  waveforms on the right triphasic at the PTA and biphasic at the dorsalis pedis on the left. She was unable to tolerate any degree of compression to  do ABIs. Unfortunately TBI's were also not done. She had venous reflux studies done in 2017. This did not show any evidence of a DVT or SVT and no venous incompetence was noted in the right leg at the time this was the only side with the wound As noted I did not look all over her old records. She apparently had a course of HBO and Baptist although I am not sure what the indication would have been. In any case she developed seizures and terminated treatment earlier. She is generally much more disabled than when we last saw her in clinic. She can no longer walk pretty much wheelchair-bound because predominantly of pain in the left hip. 04/24/2021; the patient tolerated the wraps we put on. We used Santyl and Hydrofera Blue under compression. I brought her back for a nurse visit for a change in dressing. With Medicaid we will have a hard time getting anything paid for and hence the need for compression. She arrives in clinic with all the wounds looking somewhat better in terms of surface 12/20; circumferential wound on the right from the lateral to the medial. She has open areas on the left medial and left lateral x2 on all of this with the same surface. This does not look completely healthy although she does have some epithelialization. She is not complaining of a lot of pain which is unusual for her sickle ulcers. I have not looked over her extensive records from [facility]. She had recent arterial studies and has a history of venous reflux studies I will need to look these over although I do not believe she has significant arterial disease 2023 05/22/2021; patient's wound areas measure slightly smaller. Still a lot of drainage coming from the right we have been using Hydrofera Blue and Santyl with some improvement in the wound surfaces. She tells me she will  be getting transfused later in the week for her underlying sickle cell anemia I have looked over her recent arterial studies which were done in the fall. This was in November and showed biphasic and triphasic waveforms but she could not tolerate ABIs because of pressure and unfortunately TBI's were not done. She has not had recent venous reflux studies that I can see 1/10; not much change about the same surface area. This has a yellowish surface to it very gritty. We have been using Santyl and Hydrofera Blue for a prolonged period. Culture I did last week showed methicillin sensitive staph aureus "rare". Our intake nurse reports greenish drainage which may be the AMI, MALLY ([id number]) [id number] Page 4 of 17 Hydrofera Blue itself 1/17; wounds are continue to measure smaller although I am not sure about the accuracy here. Especially the areas on the right are covered in what looks to be a nonviable surface although she does have some epithelialization. Similarly she has areas on the left medial and left lateral ankle area which appear to have a better surface and perhaps are slightly smaller. We have been using Santyl and Hydrofera Blue. She cannot tolerate mechanical debridements She went for her reflux studies which showed significant reflux at the greater saphenous vein at the saphenofemoral junction as well as the greater saphenous vein in the proximal calf on the left she had reflux in the thigh and the common femoral vein and supra vein Fishel vein reflux in the greater saphenous vein. I will have vein and vascular look at this. My thoughts have been that these are likely sickle wounds. I looked through her  old records from [facility] wound care center and then when she graduated to [facility] wound care center where she saw Dr. Ronda Fairly and Dr. Mardene Speak. Although I can see she had reflux studies done I do not see that she actually saw a vein and vascular. I  went over the fact that she had operative debridements and actual skin grafting that did not take. I do not think these wounds have ever really progressed towards healing 1/31;Substantial wounds on the right ankle area. Hyper granulated very gritty adherent debris on the surface. 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 On the left she has no DVT or SVT deep vein reflux in the common femoral vein. Superficial vein reflux in the greater saphenous vein on although the vein diameter is not really all that large. I do not think there is anything that can be done with these although I am going to send her for consultation to vein and vascular. 2/7; Wound exam; substantial wound area on the right posterior ankle area and areas on the left medial ankle and left lateral ankle. I was able to debride the left medial ankle last week fairly aggressively and it is back this week to a completely nonviable surface She will see vascular surgery this Friday and I would like them to review the venous studies and also any comments on her arterial status. If they do not see an issue here I am going to refer her to plastic surgery for an operative debridement perhaps intraoperative ACell or Integra. Eventually she will require a deep tissue culture again 2/14; substantial wound area on the right posterior ankle, medial ankle. We have been using silver alginate The patient was seen by vein and vascular she had both venous reflux studies and arterial studies. In terms of the venous reflux studies she had a chronic DVT in the popliteal vein but no evidence of deep  vein reflux. She had no evidence of superficial venous thrombosis. She did have superficial vein reflux at the saphenofemoral junction and the greater saphenous vein. On the left no evidence of a DVT no evidence of superficial venous throat thrombosis she did have deep vein reflux in the common femoral vein and superficial vein reflux in the greater saphenous vein but these were not felt to be amenable to ablation. In terms of arterial studies she had triphasic and wife biphasic wave waveforms bilaterally not felt to have a significant arterial issue. I do not get the feeling that they felt that any part of her nonhealing wounds were related to either arterial or venous issues. They did note that she had venous reflux at the right at the [facility] and [facility]. And also on the left there were reflux in the deep system at the common femoral vein and greater saphenous vein in the proximal thigh. Nothing amenable to ablation. 2/20; she is making some decent progress on the right where there is nice skin between the 2 open areas on the right ankle. The surfaces here do not look viable yet there is some surrounding epithelialization. She still has a small area on the left medial  ankle area. Hyper-granulated Jody's away always 2/28 patient has an appointment with plastic surgery on 3/8. We will see her back on 3/9. She may have to call us to get the area redressed. We've been using Santyl under silver alginate. We made a nice improvement on the left medial ankle. The larger wounds on the right also looks somewhat better in terms of epithelialization although I think they could benefit from an aggressive debridement if plastic surgery would be willing to do that. Perhaps placement of Integra or a cell 07/26/2021: She saw Dr. Arita Miss yesterday. He raised the question as to whether or not this might be pyoderma and wanted to wait until that question was answered by dermatology before proceeding with any sort of operative  debridement. We have continue to use Santyl under silver alginate with Kerlix and Coban wraps. Overall, her wounds appear to be continuing to contract and epithelialize, with some granulation tissue present. There continues to be some slough on all wound surfaces. 08/09/2021: She has not been able to get an appointment with dermatology because apparently the offices in [facility] and [place] do not accept Medicaid. She is looking into whether or not she can be seen at the main [facility] dermatology clinic. This is necessary because plastic surgery is concerned that her wounds might represent pyoderma and they did not want to do any procedure until that was clarified. We have been using Santyl under silver alginate with Kerlix and Coban wraps. Today, there was a greater amount of drainage on her dressings with a slight green discoloration and significant odor. Despite this, her wounds continue to contract and epithelialize. There is pale granulation tissue present and actually, on the left medial ankle, the granulation tissue is a bit hypertrophic. 08/16/2021: Last week, I took a culture and this grew back rare methicillin-resistant Staph aureus and rare corynebacterium. The MRSA was sensitive to gentamicin which we began applying topically on an empiric basis. This week, her wounds are a bit smaller and the drainage and odor are less. Her primary care provider is working on assisting the patient with a dermatology evaluation. She has been in silver alginate over the gentamicin that was started last week along with Kerlix and Coban wraps. 08/23/2021: Because she has Medicaid, we have been unable to get her into see any dermatologist in the [place] to rule out pyoderma gangrenosum, which was a requirement from plastic surgery prior to any sort of debridement and grafting. Despite this, however, all of her wounds continue to get smaller. The wound on her left medial ankle is nearly closed. There  is no odor from the wounds, although she still accumulates a modest amount of drainage on her dressings. 08/30/2021: The lateral right ankle wound and the medial left ankle wound are a bit smaller today. The medial right ankle wound is about the same size. They are less tender. We have still been unable to get her into dermatology. 09/06/2021: All of the wounds are about the same size today. She continues to endorse minimal pain. I communicated with Dr. Arita Miss in plastic surgery regarding our issues getting a dermatology appointment; he was out of town but indicated that he would look into perhaps performing the biopsy in his office and will have his office contact her. 09/14/2021: The patient has an appointment in dermatology, but it is not until October. Her wounds are roughly the same; she continues to have very thick purulent-looking drainage on her dressings. 09/20/2021: The left medial wound is  nearly closed and just has a bit of accumulated eschar on the surface. The right medial and lateral ankle wounds are perhaps a little bit smaller. They continue to have a very pale surface with accumulation of thin slough. 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 I did not think Jodie Echevaria was indicated based on this. She is getting topical mupirocin with Prisma silver collagen. 10/04/2021: The patient was not seen in clinic last week due to childcare coverage issues. In the interim, the left medial leg wound has closed. The right sided leg wounds are smaller. There is more granulation tissue coming through, particularly on the lateral wound. The surface remains somewhat gritty. We have been applying topical mupirocin and Prisma silver collagen. 10/11/2021: The left medial leg wound remains closed. She does complain of some anesthetic sensation to the area. Both of the right-sided leg wounds are smaller but still have accumulated slough. 10/18/2021: Both right-sided leg wounds are  minimally smaller this week. She still continues to accumulate slough and has thick drainage on her dressings. 10/23/2021: Both wounds continue to contract. There is still slough buildup. She has been approved for a keratin-based skin substitute trial product but it will not be available until next week. MARIZOL, BORROR ([id number]) [id number] Page 5 of 17 10/30/2021: The wounds are about the same to perhaps slightly smaller. There is still continued slough buildup. Unfortunately, the rep for the keratin based product did not show up today and did not answer his phone when called. 11/08/2021: The wounds are little bit smaller today. She continues to have thick drainage but the surfaces are relatively clean with just a little bit of slough accumulation. She reported to me today that she is unable to completely flex her left ankle and on examination it seems this is potentially related to scar tissue from her wounds. We do have the ProgenaMatrix trial product available for her today. 11/15/2021: Both wounds are smaller today. There is some slough accumulation on the surfaces, but the medial wound, in particular looks like it is filling in and is less deep. She did hear from physical therapy and she is going to start working with them on July 11. She is here for her second application of the trial skin substitute, ProgenaMatrix. 11/22/2021: Both wounds continue to contract, the medial more dramatically than the lateral. Both wounds have a layer of slough on the surface, but underneath this, the gritty fibrous tissue has a little bit more of a pink cast to it rather than being as pale as it has been. 11/29/2021: The wounds are roughly the same size this week, perhaps a millimeter or 2 smaller. The medial wound has filled in and is nearly flush with the surrounding skin surface. She continues to have a lot of slough accumulation on both surfaces. 12/06/2021: No significant change to her  wounds, but she has a new opening on her dorsal foot, just distal to the right lateral ankle wound. The area on her left medial ankle that reopened looks a little bit larger today. She has quite a bit of pain associated with the new wound. 12/12/2021: Her wounds look about the same but the new opening on her right lateral dorsal foot is a little bit bigger. She continues to have a fair amount of pain with this wound. 12/26/2021: The left medial ankle wound is tiny and superficial. She has 2 areas of crusting on her left lateral ankle, however, that appear to be threatening to open  again. Her right medial ankle wound is a little bit smaller today but still continues to accumulate thick rubbery slough. The new dorsal foot wound is exquisitely painful but there is no odor or purulent drainage. No erythema or induration. The right lateral ankle wound looks about the same today, again with thick rubbery slough. 01/03/2022: The left medial ankle wound has closed again. Both right ankle wounds appear to be about the same size with thick rubbery slough. The dorsal foot wound on the right continues to be quite painful and she stated that she did not want any debridement of that site today. 01/10/2022: No real change to any of her wounds. She continues to accumulate thick slough. The dorsal foot wound has merged with the lateral malleolar wound. She is experiencing significant pain in the dorsal foot portion of the ulcer. 01/16/2022: Absolutely no change or progress in her wounds. 01/24/2022: Her wounds are unchanged. She continues to build up slough and the wounds on her dorsal right foot are still exquisitely tender. 01/31/2022: The wounds actually measure a little bit narrower today. They still have thick slough on the surface but the underlying tissue seems a little less fibrotic. We changed to Iodosorb last week. 02/07/2022: Wounds continue to slowly epithelialize around the parameters. She has less pain today. She  still accumulates a fairly substantial layer of slough. 02/15/2022: No change to the wounds overall. The measured a little bit larger today per the intake nurse. She continues to have substantial slough accumulation and her pain is a little bit worse. 02/21/2022. No change at all to her wounds. I do not see that plastic surgery has received her referral yet. 02/28/2022: The wounds remain unchanged. They are dry and fibrotic with accumulation of slough and eschar. She did receive an appointment to see plastic surgery on October 23, but the office called her back and indicated they needed to reschedule and that the next available appointment was not until December. The patient became angry and decided she did not want to see this plastics group. 10/19; the patient sees Dr. Maylene Roes again next week. She is using Medihoney as the primary dressing changing this herself. 03/21/2022: She saw Dr. Ferd Hibbs at the wound care center at [facility]. Dr. Ferd Hibbs was also unable to debride her, secondary to pain and is planning to take her to the operating room for operative debridement and potential skin substitute placement. Her wounds are unchanged with thick yellow slough and a fibrotic base. She says they are too painful to be debrided. In addition, yesterday her hemoglobin was 4 and she received 2 units packed red blood cell transfusion. 04/04/2022: Her operation at [facility] is scheduled for December 15. She continues to use Medihoney on her wounds. They are a little bit less painful today and she is willing to entertain the possibility of debridement. The wounds measured a little bit larger in all dimensions today but the layer of slough is not as thick as usual. 04/18/2022: Her wounds actually measured a little bit smaller today and the extension onto the dorsal foot from the lateral wound has healed. They are less tender and there is significantly less slough present as compared to  prior visits. 05/09/2022: She had to reschedule her surgery due to lack of childcare. It is now planned for January 5. The wounds are basically unchanged, but she had a lot more drainage, which was thick and somewhat purulent in appearance. No significant odor. 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 They are quite painful today and she does not want to have debridement performed. 05/30/2022: The patient underwent surgical debridement and placement of TheraSkin to her wounds on January 5. She has been doing well and does not endorse any pain. The TheraSkin is intact and looks beautiful. It is sutured in place. There is no exudate or significant drainage. 06/04/2022: Her TheraSkin remains intact and I am starting to see granulation tissue emerging underneath the surface. No concern for infection. 06/12/2022: The TheraSkin is intact and there is more granulation tissue emerging. It is starting to look a little bit dry, however. No malodor or purulent drainage. 06/19/2022: The TheraSkin continues to look good. The edges are a bit dry, but the central portion of each of her wounds is showing a nice pink color. 06/27/2022: The TheraSkin on the lateral wound remains almost completely intact. There is nice pink tissue peaking through the mesh of the TheraSkin. On the medial leg, it is starting to breakdown a little bit, but most of the TheraSkin remains intact here, as well. Both wounds measured smaller today. 07/03/2022: The wounds are fairly dry around the perimeter today. Some of the suture is hanging loose. 07/12/2022: Both wounds are measuring smaller today. The medial ulcer is getting a bit too dry. The lateral ulcer continues to have nice buds of granulation tissue emerging. 07/26/2022: The moisture balance on both wounds is much better today. For the first time since I began seeing her, I see actual beefy red granulation tissue on the lateral ankle wound. There are more buds of deep pink  granulation tissue emerging on the medial ankle. There is some dry eschar around the edges of the medial wound and a tiny amount of slough overlying the good granulation tissue on the lateral wound. TERIAH, MUELA ([id number]) [id number] Page 6 of 17 08/09/2022: Unfortunately, she has a new wound on her left lateral lower leg, just above the ankle. It is extremely painful for her. It penetrates to the fat layer and has the same woody, fibrotic character as her previous wounds. The other wounds are both a little bit smaller with slough and some eschar around the periphery. 08/14/2022: Her right lateral ankle wound is a little bit smaller. Unfortunately, she has had an increase in her drainage and it has a purulent nature to it, similar to what was present prior to her surgical debridement. She also reports odor coming from her wounds this week. The left lateral leg wound remains extremely tender. 08/21/2022: The left lateral ankle wound is larger and more painful today. Both right ankle wounds have a little bit more vital appearance, but they have accumulated more slough. She reports less odor this week. She is currently taking Augmentin. 08/28/2022: The left lateral ankle wound continues to enlarge and remains quite painful. Both of the right ankle wounds actually look better this week. There is improved tissue quality with less slough accumulation. Electronic Signature(s) Signed: 08/28/2022 9:56:49 AM By: Duanne Guess MD FACS Previous Signature: 08/28/2022 9:44:25 AM Version By: Duanne Guess MD FACS Entered By: Duanne Guess on 08/28/2022 09:56:49 -------------------------------------------------------------------------------- Physical Exam Details Patient Name: Date of Service: [name], [name] 08/28/2022 9:15 A M Medical Record Number: [id number] Patient Account Number: [id number] Date of Birth/Sex: Treating RN: 1970/10/26 (53 y.o. F) Primary Care Provider:  Julianne Handler Other Clinician: Referring Provider: Treating Provider/Extender: Koleen Nimrod Weeks in Treatment: 59 Constitutional . Slightly tachycardic. . . no acute distress. Respiratory Normal work of breathing  on room air. Notes 08/28/2022: The left lateral ankle wound continues to enlarge and remains quite painful. Both of the right ankle wounds actually look better this week. There is improved tissue quality with less slough accumulation. Electronic Signature(s) Signed: 08/28/2022 9:57:38 AM By: Duanne Guess MD FACS Entered By: Duanne Guess on 08/28/2022 09:57:38 -------------------------------------------------------------------------------- Physician Orders Details Patient Name: Date of Service: [name], [name] 08/28/2022 9:15 A M Medical Record Number: [id number] Patient Account Number: [id number] Date of Birth/Sex: Treating RN: 15-Apr-1971 (52 y.o. 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 - Dr. Lady Gary Rm 2. Return A Anesthetic (In clinic) Topical Lidocaine 5% applied to wound bed Bathing/ Shower/ Hygiene May shower and wash wound with soap and water. AAMINA, SKIFF ([id number]) [id number] Page 7 of 17 Edema Control - Lymphedema / SCD / Other Elevate legs to the level of the heart or above for 30 minutes daily and/or when sitting for 3-4 times a day throughout the day. Avoid standing for long periods of time. Exercise  regularly Additional Orders / Instructions Follow Nutritious Diet Juven Shake 1-2 times daily. Wound Treatment Wound #17 - Lower Leg Wound Laterality: Right, Lateral Cleanser: Soap and Water 1 x Per Week/30 Days Discharge Instructions: May shower and wash wound with dial antibacterial soap and water prior to dressing change. Cleanser: Wound Cleanser 1 x Per Week/30 Days Discharge Instructions: Cleanse the wound with wound cleanser prior to applying a clean dressing using gauze sponges, not tissue or cotton balls. Peri-Wound Care: Triamcinolone 15 (g) 1 x Per Week/30 Days Discharge Instructions: Use triamcinolone 15 (g) as directed Peri-Wound Care: Sween Lotion (Moisturizing lotion) 1 x Per Week/30 Days Discharge Instructions: Apply moisturizing lotion as directed Prim Dressing: Santyl Ointment 1 x Per Week/30 Days ary Discharge Instructions: Apply nickel thick amount to wound bed as instructed Secondary Dressing: ABD Pad, 5x9 1 x Per Week/30 Days Discharge Instructions: Apply over primary dressing as directed. Secondary Dressing: Woven Gauze Sponge, Non-Sterile 4x4 in 1 x Per Week/30 Days Discharge Instructions: Moisten with normal saline and Apply over primary dressing as directed. Secured With: Elastic Bandage 4 inch (ACE bandage) 1 x Per Week/30 Days Discharge Instructions: Secure with ACE bandage as directed. Secured With: [employer], 4.5x3.1 (in/yd) 1 x Per Week/30 Days Discharge Instructions: Secure with Kerlix as directed. Wound #21 - Ankle Wound Laterality: Right, Medial Cleanser: Soap and Water 1 x Per Week/30 Days Discharge Instructions: May shower and wash wound with dial antibacterial soap and water prior to dressing change. Cleanser: Wound Cleanser 1 x Per Week/30 Days Discharge Instructions: Cleanse the wound with wound cleanser prior to applying a clean dressing using gauze sponges, not tissue or cotton balls. Peri-Wound Care: Triamcinolone 15 (g) 1 x Per Week/30  Days Discharge Instructions: Use triamcinolone 15 (g) as directed Peri-Wound Care: Sween Lotion (Moisturizing lotion) 1 x Per Week/30 Days Discharge Instructions: Apply moisturizing lotion as directed Prim Dressing: Santyl Ointment 1 x Per Week/30 Days ary Discharge Instructions: Apply nickel thick amount to wound bed as instructed Secondary Dressing: ABD Pad, 5x9 1 x Per Week/30 Days Discharge Instructions: Apply over primary  dressing as directed. Secondary Dressing: Woven Gauze Sponge, Non-Sterile 4x4 in 1 x Per Week/30 Days Discharge Instructions: Moisten with normal saline and Apply over primary dressing as directed. Secured With: Elastic Bandage 4 inch (ACE bandage) 1 x Per Week/30 Days Discharge Instructions: Secure with ACE bandage as directed. Secured With: [employer], 4.5x3.1 (in/yd) 1 x Per Week/30 Days Discharge Instructions: Secure with Kerlix as directed. Wound #24 - Ankle Wound Laterality: Left, Lateral Cleanser: Soap and Water 1 x Per Week/30 Days Discharge Instructions: May shower and wash wound with dial antibacterial soap and water prior to dressing change. Cleanser: Wound Cleanser 1 x Per Week/30 Days Discharge Instructions: Cleanse the wound with wound cleanser prior to applying a clean dressing using gauze sponges, not tissue or cotton balls. Peri-Wound Care: Triamcinolone 15 (g) 1 x Per Week/30 Days Discharge Instructions: Use triamcinolone 15 (g) as directed Peri-Wound Care: Sween Lotion (Moisturizing lotion) 1 x Per Week/30 Days TILLIE, VIVERETTE ([id number]) [id number] Page 8 of 17 Discharge Instructions: Apply moisturizing lotion as directed Prim Dressing: Santyl Ointment 1 x Per Week/30 Days ary Discharge Instructions: Apply nickel thick amount to wound bed as instructed Secondary Dressing: ABD Pad, 5x9 1 x Per Week/30 Days Discharge Instructions: Apply over primary dressing as directed. Secondary Dressing: Woven Gauze  Sponge, Non-Sterile 4x4 in 1 x Per Week/30 Days Discharge Instructions: Moisten with normal saline and Apply over primary dressing as directed. Secured With: Elastic Bandage 4 inch (ACE bandage) 1 x Per Week/30 Days Discharge Instructions: Secure with ACE bandage as directed. Secured With: [employer], 4.5x3.1 (in/yd) 1 x Per Week/30 Days Discharge Instructions: Secure with Kerlix as directed. Patient Medications llergies: No Known Allergies A Notifications Medication Indication Start End 08/28/2022 Santyl DOSE topical 250 unit/gram ointment - Apply nickel thick layer to wounds every other day with dressing changes 08/28/2022 lidocaine DOSE topical 5 % ointment - ointment topical Electronic Signature(s) Signed: 08/28/2022 10:16:59 AM By: Duanne Guess MD FACS Signed: 08/28/2022 2:02:41 PM By: Samuella Bruin Previous Signature: 08/28/2022 9:59:10 AM Version By: Duanne Guess MD FACS Entered By: Samuella Bruin on 08/28/2022 10:03:44 -------------------------------------------------------------------------------- Problem List Details Patient Name: Date of Service: [name], [name] 08/28/2022 9:15 A M Medical Record Number: [id number] Patient Account Number: [id number] Date of Birth/Sex: Treating RN: 10/16/1970 (52 y.o. [name] Primary Care Provider: Julianne Handler Other Clinician: Referring Provider: Treating Provider/Extender: Koleen Nimrod Weeks in Treatment: 56 Active Problems ICD-10 Encounter Code Description Active Date MDM Diagnosis L97.818 Non-pressure chronic ulcer of other part of right lower leg with other specified 04/17/2021 No Yes severity L97.822 Non-pressure chronic ulcer of other part of left lower leg with fat layer 08/09/2022 No Yes exposed 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 04/17/2021 No Yes Inactive Problems ICD-10 Code Description Active Date Inactive Date L97.828 Non-pressure chronic ulcer of other  part of left lower leg with other specified severity 04/17/2021 04/17/2021 [name] ([id number]) [id number] Page 9 of 17 Resolved Problems Electronic Signature(s) Signed: 08/28/2022 9:42:59 AM By: Duanne Guess MD FACS Entered By: Duanne Guess on 08/28/2022 09:42:58 -------------------------------------------------------------------------------- Progress Note Details Patient Name: Date of Service: [name], [name] 08/28/2022 9:15 A M Medical Record Number: [id number] Patient Account Number: [id number] Date of Birth/Sex: Treating RN: 17-Oct-1970 (52 y.o. F) Primary Care Provider: Julianne Handler Other Clinician: Referring Provider: Treating Provider/Extender: Koleen Nimrod Weeks in Treatment: 104 Subjective Chief Complaint Information obtained from Patient the patient is here for evaluation of her bilateral lower extremity sickle cell  ulcers 04/17/2021; patient comes in for substantial wounds on the right and left lower leg History of Present Illness (HPI) The following HPI elements were documented for the patient's wound: Location: medial and lateral ankle region on the right and left medial malleolus Quality: Patient reports experiencing a shooting pain to affected area(s). Severity: Patient states wound(s) are getting worse. Duration: right lower extremity bimalleolar ulcers have been present for approximately 2 years; the rright meedial malleolus ulcer has been there proximally 6 months Timing: Pain in wound is constant (hurts all the time) Context: The wound would happen gradually Associated Signs and Symptoms: Patient reports having increase discharge. 52 year old patient with a history of sickle cell anemia who was last seen by me with ulceration of the right lower extremity above the ankle and was referred to Dr. Marzetta Board for a surgical debridement as I was unable to do anything in the office due to excruciating pain.  At that stage she was referred from the plastic surgery service to dermatology who treated her for a skin infection with doxycycline and then Levaquin and a local antibiotic ointment. I understand the patient has since developed ulceration on the left ankle both medial and lateral and was now referred back to the wound center as dermatology has finished the management. I do not have any notes from the dermatology department Old notes: 52 year old patient with a history of sickle cell anemia, pain bilateral lower extremities, right lower extremity ulcer and has a history of receiving a skin graft( Theraskin) several months ago. She has been visiting the wound center [facility] and was seen by Dr. Leanord Hawking and Dr. Marzetta Board. after prolonged conservator therapy between July 2016 and January 2017. She had been seen by the plastic surgeon and taken to the OR for debridement and application of Theraskin. She had 3 applications of Theraskin and was then treated with collagen. Prior to that she had a history of similar problems in 2014 and was treated conservatively. Had a reflux study done for the right lower extremity in August 2016 without reflux or DVT . Past medical history significant for sickle cell disease, anemia, leg ulcers, cholelithiasis,and has never been a smoker. Once the patient was discharged on the wound center she says within 2 or 3 weeks the problems recurred and she has been treating it conservatively. since I saw her 3 weeks ago at [facility] she has been unable to get her dressing material but has completed a course of doxycycline. 6/7/ 2017 -- lower extremity venous duplex reflux evaluation was done oo No evidence of SVT or DVT in the RLL. No venous incompetence in the RLL. No further vascular workup is indicated at this time. She was seen by Dr. Mina Marble, on 10/04/2015. She agreed with the plan of taking her to the OR for debridement and application of theraskin and would also  take biopsies to rule out pyoderma gangrenosum. Follow-up note dated May 31 received and she was status post application of Theraskin to multiple ulcers around the right ankle. Pathology did not show evidence of malignancy or pyoderma gangrenosum. She would continue to see as in the wound clinic for further care and see Dr. Marzetta Board as needed. The patient brought the biopsy report and it was consistent with stasis ulcer no evidence of malignancy and the comment was that there was some adjacent neovascularization, fibrosis and patchy perivascular chronic inflammation. 11/15/2015 -- today we applied her first application of Theraskin 11/30/15; TheraSkin #2 12/13/2015 -- she is having a lot of  pain locally and is here for possible application of a theraskin today. 01/16/2016 -- the patient has significant pain and has noticed despite in spite of all local care and oral pain medication. It is impossible to debride her in the office. 02/06/2016 -- I do not see any notes from Dr. Leta Baptist( the patient has not made a call to the office know as she heard from them) and the only visit to recently was with her PCP Dr. Gypsy Decant -- I saw her on 01/16/2016 and prescribed 90 tablets of oxycodone 10 mg and did lab work and screening for HIV. the HIV was negative and hemoglobin was 6.3 with a WBC count of 14.9 and hematocrit of 17.8 with platelets of 561. 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 The bimalleolar ulcers to the right lower extremity have been present for approximately 2 years, the left medial malleolus ulcer has been present approximately 6 months. She has followed with Dr.Thimmappa in the past and has had a total of 3 applications of Theraskin (01/2015, 09/2015, 06/17/16). She has also followed with Dr. Meyer Russel here in the clinic and has received 2 applications of TheraSkin (11/10/15,  11/30/15). The patient does experience chronic, and is not amenable to debridement. 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 She is not currently on any antibiotic therapy and has not been treated with any recently. 07/17/2016 -- was seen by Dr. Leta Baptist of plastic surgery who saw her 2 weeks postop application of Theraskin #3. She had removed her dressing and asked her to apply silver alginate on alternate days and follow-up back with the wound center. Future debridements and application of skin substitute would have to be done in the hospital due to her high risk for anesthesia. READMISSION 04/17/2021 [name] ([id number]) [id number] Page 10 of 17 Patient is now a 52 year old woman that we have had in this clinic for a prolonged period of time and 2016-2017 and then again for 2 visits in February 2018. At that point she had wounds on the right lower leg predominantly medial. She had also been seen by plastic surgery Dr. Marzetta Board who I believe took her to the OR for operative debridement and application of TheraSkin in 2017. After she left our clinic she was followed for a very prolonged period of time in the wound care center in [facility] who then referred her ultimately to [facility] where she was seen by Dr. Mardene Speak. Again taken her to the OR for skin grafting which apparently did not take. She had multiple other attempts at dressings although I have not really looked over all of these notes in great detail. She has not been seen in a wound care center in about a year. She states over the last year in addition to her right lower leg she has developed wounds on the left lower leg quite extensive. She is using Xeroform to all of these wounds without really any improvement. She also has Medicaid which does not cover wound products. The patient has had vascular work-ups in the past including most recently on 03/28/2021 showing  biphasic waveforms on the right triphasic at the PTA and biphasic at the dorsalis pedis on the left. She was unable to tolerate any degree of compression to do ABIs. Unfortunately TBI's were also not done. She had venous reflux studies done in 2017. This did not  show any evidence of a DVT or SVT and no venous incompetence was noted in the right leg at the time this was the only side with the wound As noted I did not look all over her old records. She apparently had a course of HBO and [facility] although I am not sure what the indication would have been. In any case she developed seizures and terminated treatment earlier. She is generally much more disabled than when we last saw her in clinic. She can no longer walk pretty much wheelchair-bound because predominantly of pain in the left hip. 04/24/2021; the patient tolerated the wraps we put on. We used Santyl and Hydrofera Blue under compression. I brought her back for a nurse visit for a change in dressing. With Medicaid we will have a hard time getting anything paid for and hence the need for compression. She arrives in clinic with all the wounds looking somewhat better in terms of surface 12/20; circumferential wound on the right from the lateral to the medial. She has open areas on the left medial and left lateral x2 on all of this with the same surface. This does not look completely healthy although she does have some epithelialization. She is not complaining of a lot of pain which is unusual for her sickle ulcers. I have not looked over her extensive records from [facility]. She had recent arterial studies and has a history of venous reflux studies I will need to look these over although I do not believe she has significant arterial disease 2023 05/22/2021; patient's wound areas measure slightly smaller. Still a lot of drainage coming from the right we have been using Hydrofera Blue and Santyl with some improvement in the wound surfaces. She tells me  she will be getting transfused later in the week for her underlying sickle cell anemia I have looked over her recent arterial studies which were done in the fall. This was in November and showed biphasic and triphasic waveforms but she could not tolerate ABIs because of pressure and unfortunately TBI's were not done. She has not had recent venous reflux studies that I can see 1/10; not much change about the same surface area. This has a yellowish surface to it very gritty. We have been using Santyl and Hydrofera Blue for a prolonged period. Culture I did last week showed methicillin sensitive staph aureus "rare". Our intake nurse reports greenish drainage which may be the Hydrofera Blue itself 1/17; wounds are continue to measure smaller although I am not sure about the accuracy here. Especially the areas on the right are covered in what looks to be a nonviable surface although she does have some epithelialization. Similarly she has areas on the left medial and left lateral ankle area which appear to have a better surface and perhaps are slightly smaller. We have been using Santyl and Hydrofera Blue. She cannot tolerate mechanical debridements She went for her reflux studies which showed significant reflux at the greater saphenous vein at the saphenofemoral junction as well as the greater saphenous vein in the proximal calf on the left she had reflux in the thigh and the common femoral vein and supra vein Fishel vein reflux in the greater saphenous vein. I will have vein and vascular look at this. My thoughts have been that these are likely sickle wounds. I looked through her old records from [facility] wound care center and then when she graduated to [facility] wound care center where she saw Dr. Ronda Fairly and Dr.  Molnar. Although I can see she had reflux studies done I do not see that she actually saw a vein and vascular. I went over the fact that she had operative debridements and actual skin  grafting that did not take. I do not think these wounds have ever really progressed towards healing 1/31;Substantial wounds on the right ankle area. Hyper granulated very gritty adherent debris on the surface. 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 On the left she has no DVT or SVT deep vein reflux in the common femoral vein. Superficial vein reflux in the greater saphenous vein on although the vein diameter is not really all that large. I do not think there is anything that can be done with these although I am going to send her for consultation to vein and vascular. 2/7; Wound exam; substantial wound area on the right posterior ankle area and areas on the left medial ankle and left lateral ankle. I was able to debride the left medial ankle last week fairly aggressively and it is back this week to a completely nonviable surface She will see vascular surgery this Friday and I would like them to review the venous studies and also any comments on her arterial status. If they do not see an issue here I am going to refer her to plastic surgery for an operative debridement perhaps intraoperative ACell or Integra. Eventually she will require a deep tissue culture again 2/14; substantial wound area on the right posterior ankle, medial ankle. We have been using silver alginate The patient was seen by vein and vascular she had both venous reflux studies and arterial studies. 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 She had no evidence of superficial venous thrombosis. She did  have superficial vein reflux at the saphenofemoral junction and the greater saphenous vein. On the left no evidence of a DVT no evidence of superficial venous throat thrombosis she did have deep vein reflux in the common femoral vein and superficial vein reflux in the greater saphenous vein but these were not felt to be amenable to ablation. In terms of arterial studies she had triphasic and wife biphasic wave waveforms bilaterally not felt to have a significant arterial issue. I do not get the feeling that they felt that any part of her nonhealing wounds were related to either arterial or venous issues. They did note that she had venous reflux at the right at the [place] and GCV. And also on the left there were reflux in the deep system at the common femoral vein and greater saphenous vein in the proximal thigh. Nothing amenable to ablation. 2/20; she is making some decent progress on the right where there is nice skin between the 2 open areas on the right ankle. The surfaces here do not look viable yet there is some surrounding epithelialization. She still has a small area on the left medial ankle area. Hyper-granulated Jody's away always 2/28 patient has an appointment with plastic surgery on 3/8. We will see her back on 3/9. She may have to  call us to get the area redressed. We've been using Santyl under silver alginate. We made a nice improvement on the left medial ankle. The larger wounds on the right also looks somewhat better in terms of epithelialization although I think they could benefit from an aggressive debridement if plastic surgery would be willing to do that. Perhaps placement of Integra or a cell 07/26/2021: She saw Dr. Arita Miss yesterday. He raised the question as to whether or not this might be pyoderma and wanted to wait until that question was answered by dermatology before proceeding with any sort of operative debridement. We have continue to use Santyl under silver alginate with Kerlix  and Coban wraps. Overall, her wounds appear to be continuing to contract and epithelialize, with some granulation tissue present. There continues to be some slough on all wound surfaces. 08/09/2021: She has not been able to get an appointment with dermatology because apparently the offices in [facility] and [place] do not accept [employer] ([id number]) [id number] Page 11 of 17 Medicaid. She is looking into whether or not she can be seen at the main [facility] dermatology clinic. This is necessary because plastic surgery is concerned that her wounds might represent pyoderma and they did not want to do any procedure until that was clarified. We have been using Santyl under silver alginate with Kerlix and Coban wraps. Today, there was a greater amount of drainage on her dressings with a slight green discoloration and significant odor. Despite this, her wounds continue to contract and epithelialize. There is pale granulation tissue present and actually, on the left medial ankle, the granulation tissue is a bit hypertrophic. 08/16/2021: Last week, I took a culture and this grew back rare methicillin-resistant Staph aureus and rare corynebacterium. The MRSA was sensitive to gentamicin which we began applying topically on an empiric basis. This week, her wounds are a bit smaller and the drainage and odor are less. Her primary care provider is working on assisting the patient with a dermatology evaluation. She has been in silver alginate over the gentamicin that was started last week along with Kerlix and Coban wraps. 08/23/2021: Because she has Medicaid, we have been unable to get her into see any dermatologist in the [place] to rule out pyoderma gangrenosum, which was a requirement from plastic surgery prior to any sort of debridement and grafting. Despite this, however, all of her wounds continue to get smaller. The wound on her left medial ankle is nearly closed.  There is no odor from the wounds, although she still accumulates a modest amount of drainage on her dressings. 08/30/2021: The lateral right ankle wound and the medial left ankle wound are a bit smaller today. The medial right ankle wound is about the same size. They are less tender. We have still been unable to get her into dermatology. 09/06/2021: All of the wounds are about the same size today. She continues to endorse minimal pain. I communicated with Dr. Arita Miss in plastic surgery regarding our issues getting a dermatology appointment; he was out of town but indicated that he would look into perhaps performing the biopsy in his office and will have his office contact her. 09/14/2021: The patient has an appointment in dermatology, but it is not until October. Her wounds are roughly the same; she continues to have very thick purulent-looking drainage on her dressings. 09/20/2021: The left medial wound is nearly closed and just has a bit of accumulated eschar on the surface. The right medial and lateral ankle  wounds are perhaps a little bit smaller. They continue to have a very pale surface with accumulation of thin slough. 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 I did not think Jodie Echevaria was indicated based on this. She is getting topical mupirocin with Prisma silver collagen. 10/04/2021: The patient was not seen in clinic last week due to childcare coverage issues. In the interim, the left medial leg wound has closed. The right sided leg wounds are smaller. There is more granulation tissue coming through, particularly on the lateral wound. The surface remains somewhat gritty. We have been applying topical mupirocin and Prisma silver collagen. 10/11/2021: The left medial leg wound remains closed. She does complain of some anesthetic sensation to the area. Both of the right-sided leg wounds are smaller but still have accumulated slough. 10/18/2021: Both right-sided leg wounds are  minimally smaller this week. She still continues to accumulate slough and has thick drainage on her dressings. 10/23/2021: Both wounds continue to contract. There is still slough buildup. She has been approved for a keratin-based skin substitute trial product but it will not be available until next week. 10/30/2021: The wounds are about the same to perhaps slightly smaller. There is still continued slough buildup. Unfortunately, the rep for the keratin based product did not show up today and did not answer his phone when called. 11/08/2021: The wounds are little bit smaller today. She continues to have thick drainage but the surfaces are relatively clean with just a little bit of slough accumulation. She reported to me today that she is unable to completely flex her left ankle and on examination it seems this is potentially related to scar tissue from her wounds. We do have the ProgenaMatrix trial product available for her today. 11/15/2021: Both wounds are smaller today. There is some slough accumulation on the surfaces, but the medial wound, in particular looks like it is filling in and is less deep. She did hear from physical therapy and she is going to start working with them on July 11. She is here for her second application of the trial skin substitute, ProgenaMatrix. 11/22/2021: Both wounds continue to contract, the medial more dramatically than the lateral. Both wounds have a layer of slough on the surface, but underneath this, the gritty fibrous tissue has a little bit more of a pink cast to it rather than being as pale as it has been. 11/29/2021: The wounds are roughly the same size this week, perhaps a millimeter or 2 smaller. The medial wound has filled in and is nearly flush with the surrounding skin surface. She continues to have a lot of slough accumulation on both surfaces. 12/06/2021: No significant change to her wounds, but she has a new opening on her dorsal foot, just distal to the right  lateral ankle wound. The area on her left medial ankle that reopened looks a little bit larger today. She has quite a bit of pain associated with the new wound. 12/12/2021: Her wounds look about the same but the new opening on her right lateral dorsal foot is a little bit bigger. She continues to have a fair amount of pain with this wound. 12/26/2021: The left medial ankle wound is tiny and superficial. She has 2 areas of crusting on her left lateral ankle, however, that appear to be threatening to open again. Her right medial ankle wound is a little bit smaller today but still continues to accumulate thick rubbery slough. The new dorsal foot wound is exquisitely  painful but there is no odor or purulent drainage. No erythema or induration. The right lateral ankle wound looks about the same today, again with thick rubbery slough. 01/03/2022: The left medial ankle wound has closed again. Both right ankle wounds appear to be about the same size with thick rubbery slough. The dorsal foot wound on the right continues to be quite painful and she stated that she did not want any debridement of that site today. 01/10/2022: No real change to any of her wounds. She continues to accumulate thick slough. The dorsal foot wound has merged with the lateral malleolar wound. She is experiencing significant pain in the dorsal foot portion of the ulcer. 01/16/2022: Absolutely no change or progress in her wounds. 01/24/2022: Her wounds are unchanged. She continues to build up slough and the wounds on her dorsal right foot are still exquisitely tender. 01/31/2022: The wounds actually measure a little bit narrower today. They still have thick slough on the surface but the underlying tissue seems a little less fibrotic. We changed to Iodosorb last week. 02/07/2022: Wounds continue to slowly epithelialize around the parameters. She has less pain today. She still accumulates a fairly substantial layer of slough. 02/15/2022: No change  to the wounds overall. The measured a little bit larger today per the intake nurse. She continues to have substantial slough accumulation and her pain is a little bit worse. 02/21/2022. No change at all to her wounds. I do not see that plastic surgery has received her referral yet. 02/28/2022: The wounds remain unchanged. They are dry and fibrotic with accumulation of slough and eschar. She did receive an appointment to see plastic surgery on October 23, but the office called her back and indicated they needed to reschedule and that the next available appointment was not until December. The patient became angry and decided she did not want to see this plastics group. DEBARAH, MCCUMBERS ([id number]) [id number] Page 12 of 17 10/19; the patient sees Dr. Maylene Roes again next week. She is using Medihoney as the primary dressing changing this herself. 03/21/2022: She saw Dr. Ferd Hibbs at the wound care center at [facility] Dr. Ferd Hibbs was also unable to debride her, secondary to pain and is planning to take her to the operating room for operative debridement and potential skin substitute placement. Her wounds are unchanged with thick yellow slough and a fibrotic base. She says they are too painful to be debrided. In addition, yesterday her hemoglobin was 4 and she received 2 units packed red blood cell transfusion. 04/04/2022: Her operation at [facility] is scheduled for December 15. She continues to use Medihoney on her wounds. They are a little bit less painful today and she is willing to entertain the possibility of debridement. The wounds measured a little bit larger in all dimensions today but the layer of slough is not as thick as usual. 04/18/2022: Her wounds actually measured a little bit smaller today and the extension onto the dorsal foot from the lateral wound has healed. They are less tender and there is significantly less slough present as compared  to prior visits. 05/09/2022: She had to reschedule her surgery due to lack of childcare. It is now planned for January 5. The wounds are basically unchanged, but she had a lot more drainage, which was thick and somewhat purulent in appearance. No significant odor. 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 They are quite painful today and she does not want to have debridement performed. 05/30/2022: The  patient underwent surgical debridement and placement of TheraSkin to her wounds on January 5. She has been doing well and does not endorse any pain. The TheraSkin is intact and looks beautiful. It is sutured in place. There is no exudate or significant drainage. 06/04/2022: Her TheraSkin remains intact and I am starting to see granulation tissue emerging underneath the surface. No concern for infection. 06/12/2022: The TheraSkin is intact and there is more granulation tissue emerging. It is starting to look a little bit dry, however. No malodor or purulent drainage. 06/19/2022: The TheraSkin continues to look good. The edges are a bit dry, but the central portion of each of her wounds is showing a nice pink color. 06/27/2022: The TheraSkin on the lateral wound remains almost completely intact. There is nice pink tissue peaking through the mesh of the TheraSkin. On the medial leg, it is starting to breakdown a little bit, but most of the TheraSkin remains intact here, as well. Both wounds measured smaller today. 07/03/2022: The wounds are fairly dry around the perimeter today. Some of the suture is hanging loose. 07/12/2022: Both wounds are measuring smaller today. The medial ulcer is getting a bit too dry. The lateral ulcer continues to have nice buds of granulation tissue emerging. 07/26/2022: The moisture balance on both wounds is much better today. For the first time since I began seeing her, I see actual beefy red granulation tissue on the lateral ankle wound. There are more buds of deep pink  granulation tissue emerging on the medial ankle. There is some dry eschar around the edges of the medial wound and a tiny amount of slough overlying the good granulation tissue on the lateral wound. 08/09/2022: Unfortunately, she has a new wound on her left lateral lower leg, just above the ankle. It is extremely painful for her. It penetrates to the fat layer and has the same woody, fibrotic character as her previous wounds. The other wounds are both a little bit smaller with slough and some eschar around the periphery. 08/14/2022: Her right lateral ankle wound is a little bit smaller. Unfortunately, she has had an increase in her drainage and it has a purulent nature to it, similar to what was present prior to her surgical debridement. She also reports odor coming from her wounds this week. The left lateral leg wound remains extremely tender. 08/21/2022: The left lateral ankle wound is larger and more painful today. Both right ankle wounds have a little bit more vital appearance, but they have accumulated more slough. She reports less odor this week. She is currently taking Augmentin. 08/28/2022: The left lateral ankle wound continues to enlarge and remains quite painful. Both of the right ankle wounds actually look better this week. There is improved tissue quality with less slough accumulation. Patient History Information obtained from Patient. Family History Diabetes - Mother, Lung Disease - Mother, No family history of Cancer, Heart Disease, Hereditary Spherocytosis, Hypertension, Kidney Disease, Seizures, Stroke, Thyroid Problems, Tuberculosis. Social History Never smoker, Marital Status - Married, Alcohol Use - Never, Drug Use - No History, Caffeine Use - Daily. Medical History Eyes Denies history of Cataracts, Glaucoma, Optic Neuritis Ear/Nose/Mouth/Throat Denies history of Chronic sinus problems/congestion, Middle ear problems Hematologic/Lymphatic Patient has history of Anemia, Sickle  Cell Disease Denies history of Hemophilia, Human Immunodeficiency Virus, Lymphedema Respiratory Denies history of Aspiration, Asthma, Chronic Obstructive Pulmonary Disease (COPD), Pneumothorax, Sleep Apnea, Tuberculosis Cardiovascular Denies history of Angina, Arrhythmia, Congestive Heart Failure, Coronary Artery Disease, Deep Vein Thrombosis, Hypertension, Hypotension, Myocardial Infarction,  Peripheral Arterial Disease, Peripheral Venous Disease, Phlebitis, Vasculitis Gastrointestinal Denies history of Cirrhosis , Colitis, Crohnoos, Hepatitis A, Hepatitis B, Hepatitis C Endocrine Denies history of Type I Diabetes, Type II Diabetes Genitourinary Denies history of End Stage Renal Disease Immunological Denies history of Lupus Erythematosus, Raynaudoos, Scleroderma Integumentary (Skin) Denies history of History of Burn Musculoskeletal Denies history of Gout, Rheumatoid Arthritis, Osteoarthritis, Osteomyelitis Neurologic Patient has history of Neuropathy - right foot intermittant SHANEIL, YAZDI ([id number]) [id number] Page 13 of 17 Denies history of Dementia, Quadriplegia, Paraplegia, Seizure Disorder Oncologic Denies history of Received Chemotherapy, Received Radiation Psychiatric Denies history of Anorexia/bulimia, Confinement Anxiety Hospitalization/Surgery History - c section x2. - left breast lumpectomy. - iandD right ankle with theraskin. Medical A Surgical History Notes nd Constitutional Symptoms (General Health) H/O miscarriage Cardiovascular bradycardia Gastrointestinal cholilithiasis Objective Constitutional Slightly tachycardic. no acute distress. Vitals Time Taken: 9:14 AM, Height: 67 in, Weight: 134 lbs, BMI: 21, Temperature: 98.6 F, Pulse: 105 bpm, Respiratory Rate: 18 breaths/min, Blood Pressure: 134/56 mmHg. Respiratory Normal work of breathing on room air. General Notes: 08/28/2022: The left lateral ankle wound continues to  enlarge and remains quite painful. Both of the right ankle wounds actually look better this week. There is improved tissue quality with less slough accumulation. Integumentary (Hair, Skin) Wound #17 status is Open. Original cause of wound was Gradually Appeared. The date acquired was: 10/05/2012. The wound has been in treatment 71 weeks. The wound is located on the Right,Lateral Lower Leg. The wound measures 6.5cm length x 5cm width x 0.1cm depth; 25.525cm^2 area and 2.553cm^3 volume. There is Fat Layer (Subcutaneous Tissue) exposed. There is no tunneling or undermining noted. There is a medium amount of purulent drainage noted. The wound margin is distinct with the outline attached to the wound base. There is medium (34-66%) pink granulation within the wound bed. There is a medium (34- 66%) amount of necrotic tissue within the wound bed including Adherent Slough. The periwound skin appearance had no abnormalities noted for moisture. The periwound skin appearance exhibited: Scarring. The periwound skin appearance did not exhibit: Ecchymosis. Periwound temperature was noted as No Abnormality. The periwound has tenderness on palpation. Wound #21 status is Open. Original cause of wound was Gradually Appeared. The date acquired was: 06/26/2021. The wound has been in treatment 61 weeks. The wound is located on the Right,Medial Ankle. The wound measures 7cm length x 3.2cm width x 0.1cm depth; 17.593cm^2 area and 1.759cm^3 volume. There is Fat Layer (Subcutaneous Tissue) exposed. There is no tunneling or undermining noted. There is a medium amount of purulent drainage noted. The wound margin is distinct with the outline attached to the wound base. There is medium (34-66%) pink granulation within the wound bed. There is a medium (34-66%) amount of necrotic tissue within the wound bed including Adherent Slough. The periwound skin appearance had no abnormalities noted for moisture. The periwound skin appearance  had no abnormalities noted for color. The periwound skin appearance exhibited: Scarring. Periwound temperature was noted as No Abnormality. The periwound has tenderness on palpation. Wound #24 status is Open. Original cause of wound was Gradually Appeared. The date acquired was: 08/06/2022. The wound has been in treatment 2 weeks. The wound is located on the Left,Lateral Ankle. The wound measures 1.5cm length x 2.5cm width x 0.3cm depth; 2.945cm^2 area and 0.884cm^3 volume. There is Fat Layer (Subcutaneous Tissue) exposed. There is no tunneling or undermining noted. There is a medium amount of serosanguineous drainage noted. The wound margin is distinct with the outline  attached to the wound base. There is small (1-33%) pink granulation within the wound bed. There is a large (67-100%) amount of necrotic tissue within the wound bed including Eschar and Adherent Slough. The periwound skin appearance had no abnormalities noted for texture. The periwound skin appearance had no abnormalities noted for moisture. The periwound skin appearance had no abnormalities noted for color. Periwound temperature was noted as No Abnormality. The periwound has tenderness on palpation. 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 There was a Selective/Open Wound Non-Viable Tissue Debridement with a total area of 12 sq cm performed by Duanne Guess, MD. With the following instrument(s): Curette to remove Non-Viable tissue/material. [name] ([id number]) [id number] Page 14 of 17 Material removed includes Eschar and Slough and after achieving pain control using Lidocaine  4% T opical Solution. No specimens were taken. A time out was conducted at 09:38, prior to the start of the procedure. A Minimum amount of bleeding was controlled with Pressure. The procedure was tolerated well with a pain level of 0 throughout and a pain level of 0 following the procedure. Post Debridement Measurements: 6.5cm length x 0.5cm width x 0.1cm depth; 0.255cm^3 volume. Character of Wound/Ulcer Post Debridement is improved. Post procedure Diagnosis Wound #17: Same as Pre-Procedure General Notes: Scribed for Dr. Lady Gary by Brenton Grills RN.Marland Kitchen Wound #21 Pre-procedure diagnosis of Wound #21 is a Sickle Cell Lesion located on the Right,Medial Ankle .Severity of Tissue Pre Debridement is: Fat layer exposed. There was a Selective/Open Wound Non-Viable Tissue Debridement with a total area of 22.4 sq cm performed by Duanne Guess, MD. With the following instrument(s): Curette to remove Non-Viable tissue/material. Material removed includes Eschar and Slough and after achieving pain control using Lidocaine 4% Topical Solution. No specimens were taken. A time out was conducted at 09:38, prior to the start of the procedure. A Minimum amount of bleeding was controlled with Pressure. The procedure was tolerated well with a pain level of 0 throughout and a pain level of 0 following the procedure. Post Debridement Measurements: 7cm length x 3.2cm width x 0.1cm depth; 1.759cm^3 volume. Character of Wound/Ulcer Post Debridement is improved. Severity of Tissue Post Debridement is: Fat layer exposed. Post procedure Diagnosis Wound #21: Same as Pre-Procedure General Notes: Scribed for Dr. Lady Gary by Brenton Grills RN.Marland Kitchen Plan Follow-up Appointments: Return Appointment in 2 weeks. - Dr. Lady Gary Rm 2. Anesthetic: (In clinic) Topical Lidocaine 5% applied to wound bed Bathing/ Shower/ Hygiene: May shower and wash wound with soap and water. Edema Control - Lymphedema / SCD / Other: Elevate legs to the level of  the heart or above for 30 minutes daily and/or when sitting for 3-4 times a day throughout the day. Avoid standing for long periods of time. Exercise regularly Additional Orders / Instructions: Follow Nutritious Diet Juven Shake 1-2 times daily. The following medication(s) was prescribed: Santyl topical 250 unit/gram ointment Apply nickel thick layer to wounds every other day with dressing changes starting 08/28/2022 lidocaine topical 5 % ointment ointment topical was prescribed at facility WOUND #17: - Lower Leg Wound Laterality: Right, Lateral Cleanser: Soap and Water 1 x Per Week/30 Days Discharge Instructions: May shower and wash wound with dial antibacterial soap and water prior to dressing  change. Cleanser: Wound Cleanser 1 x Per Week/30 Days Discharge Instructions: Cleanse the wound with wound cleanser prior to applying a clean dressing using gauze sponges, not tissue or cotton balls. Peri-Wound Care: Triamcinolone 15 (g) 1 x Per Week/30 Days Discharge Instructions: Use triamcinolone 15 (g) as directed Peri-Wound Care: Sween Lotion (Moisturizing lotion) 1 x Per Week/30 Days Discharge Instructions: Apply moisturizing lotion as directed Prim Dressing: Santyl Ointment 1 x Per Week/30 Days ary Discharge Instructions: Apply nickel thick amount to wound bed as instructed Secondary Dressing: ABD Pad, 5x9 1 x Per Week/30 Days Discharge Instructions: Apply over primary dressing as directed. Secondary Dressing: Woven Gauze Sponge, Non-Sterile 4x4 in 1 x Per Week/30 Days Discharge Instructions: Moisten with normal saline and Apply over primary dressing as directed. Secured With: Elastic Bandage 4 inch (ACE bandage) 1 x Per Week/30 Days Discharge Instructions: Secure with ACE bandage as directed. Secured With: [employer], 4.5x3.1 (in/yd) 1 x Per Week/30 Days Discharge Instructions: Secure with Kerlix as directed. WOUND #21: - Ankle Wound Laterality: Right, Medial Cleanser: Soap and  Water 1 x Per Week/30 Days Discharge Instructions: May shower and wash wound with dial antibacterial soap and water prior to dressing change. Cleanser: Wound Cleanser 1 x Per Week/30 Days Discharge Instructions: Cleanse the wound with wound cleanser prior to applying a clean dressing using gauze sponges, not tissue or cotton balls. Peri-Wound Care: Triamcinolone 15 (g) 1 x Per Week/30 Days Discharge Instructions: Use triamcinolone 15 (g) as directed Peri-Wound Care: Sween Lotion (Moisturizing lotion) 1 x Per Week/30 Days Discharge Instructions: Apply moisturizing lotion as directed Prim Dressing: Santyl Ointment 1 x Per Week/30 Days ary Discharge Instructions: Apply nickel thick amount to wound bed as instructed Secondary Dressing: ABD Pad, 5x9 1 x Per Week/30 Days Discharge Instructions: Apply over primary dressing as directed. Secondary Dressing: Woven Gauze Sponge, Non-Sterile 4x4 in 1 x Per Week/30 Days Discharge Instructions: Moisten with normal saline and Apply over primary dressing as directed. Secured With: Elastic Bandage 4 inch (ACE bandage) 1 x Per Week/30 Days Discharge Instructions: Secure with ACE bandage as directed. Secured With: [employer], 4.5x3.1 (in/yd) 1 x Per Week/30 Days Discharge Instructions: Secure with Kerlix as directed. WOUND #24: - Ankle Wound Laterality: Left, Lateral Cleanser: Soap and Water 1 x Per Week/30 Days Discharge Instructions: May shower and wash wound with dial antibacterial soap and water prior to dressing change. Cleanser: Wound Cleanser 1 x Per Week/30 Days Discharge Instructions: Cleanse the wound with wound cleanser prior to applying a clean dressing using gauze sponges, not tissue or cotton balls. Peri-Wound Care: Triamcinolone 15 (g) 1 x Per Week/30 Days Discharge Instructions: Use triamcinolone 15 (g) as directed Peri-Wound Care: Sween Lotion (Moisturizing lotion) 1 x Per Week/30 Days Discharge Instructions: Apply moisturizing  lotion as directed Prim Dressing: Santyl Ointment 1 x Per Week/30 Days ary Discharge Instructions: Apply nickel thick amount to wound bed as instructed [name] ([id number]) [id number] Page 15 of 17 Secondary Dressing: ABD Pad, 5x9 1 x Per Week/30 Days Discharge Instructions: Apply over primary dressing as directed. Secondary Dressing: Woven Gauze Sponge, Non-Sterile 4x4 in 1 x Per Week/30 Days Discharge Instructions: Moisten with normal saline and Apply over primary dressing as directed. Secured With: Elastic Bandage 4 inch (ACE bandage) 1 x Per Week/30 Days Discharge Instructions: Secure with ACE bandage as directed. Secured With: [employer], 4.5x3.1 (in/yd) 1 x Per Week/30 Days Discharge Instructions: Secure with Kerlix as directed. 08/28/2022: The left lateral ankle wound continues to  enlarge and remains quite painful. Both of the right ankle wounds actually look better this week. There is improved tissue quality with less slough accumulation. I used a curette to debride slough and eschar from both of the right ankle wounds. The left ankle wound remains too painful for debridement. I am going to try using Santyl to enzymatically debride these wounds. She has used this in the past with favorable results. A prescription was sent in for her. She will change her dressings at home, as she has done in the past, using Ace bandages for compression. We will have her follow-up in 2 weeks. Electronic Signature(s) Signed: 08/28/2022 10:16:59 AM By: Duanne Guess MD FACS Signed: 08/28/2022 2:02:41 PM By: Samuella Bruin Previous Signature: 08/28/2022 10:02:25 AM Version By: Duanne Guess MD FACS Entered By: Samuella Bruin on 08/28/2022 10:04:04 -------------------------------------------------------------------------------- HxROS Details Patient Name: Date of Service: [name], [name] 08/28/2022 9:15 A M Medical Record Number: [id number] Patient  Account Number: [id number] Date of Birth/Sex: Treating RN: [birth year] (52 y.o. F) Primary Care Provider: Julianne Handler Other Clinician: Referring Provider: Treating Provider/Extender: Koleen Nimrod Weeks in Treatment: 7 Information Obtained From Patient Constitutional Symptoms (General Health) Medical History: Past Medical History Notes: H/O miscarriage Eyes Medical History: Negative for: Cataracts; Glaucoma; Optic Neuritis Ear/Nose/Mouth/Throat Medical History: Negative for: Chronic sinus problems/congestion; Middle ear problems Hematologic/Lymphatic Medical History: Positive for: Anemia; Sickle Cell Disease Negative for: Hemophilia; Human Immunodeficiency Virus; Lymphedema Respiratory Medical History: Negative for: Aspiration; Asthma; Chronic Obstructive Pulmonary Disease (COPD); Pneumothorax; Sleep Apnea; Tuberculosis Cardiovascular Medical History: Negative for: Angina; Arrhythmia; Congestive Heart Failure; Coronary Artery Disease; Deep Vein Thrombosis; Hypertension; Hypotension; Myocardial Infarction; Peripheral Arterial Disease; Peripheral Venous Disease; Phlebitis; Vasculitis Past Medical History Notes: bradycardia Gastrointestinal Medical HistoryJULLIA, MULLIGAN ([id number]) [id number] Page 16 of 17 Negative for: Cirrhosis ; Colitis; Crohns; Hepatitis A; Hepatitis B; Hepatitis C Past Medical History Notes: cholilithiasis Endocrine Medical History: Negative for: Type I Diabetes; Type II Diabetes Genitourinary Medical History: Negative for: End Stage Renal Disease Immunological Medical History: Negative for: Lupus Erythematosus; Raynauds; Scleroderma Integumentary (Skin) Medical History: Negative for: History of Burn Musculoskeletal Medical History: Negative for: Gout; Rheumatoid Arthritis; Osteoarthritis; Osteomyelitis Neurologic Medical History: Positive for: Neuropathy - right foot intermittant Negative  for: Dementia; Quadriplegia; Paraplegia; Seizure Disorder Oncologic Medical History: Negative for: Received Chemotherapy; Received Radiation Psychiatric Medical History: Negative for: Anorexia/bulimia; Confinement Anxiety Immunizations Pneumococcal Vaccine: Received Pneumococcal Vaccination: No Implantable Devices None Hospitalization / Surgery History Type of Hospitalization/Surgery c section x2 left breast lumpectomy iandD right ankle with theraskin Family and Social History Cancer: No; Diabetes: Yes - Mother; Heart Disease: No; Hereditary Spherocytosis: No; Hypertension: No; Kidney Disease: No; Lung Disease: Yes - Mother; Seizures: No; Stroke: No; Thyroid Problems: No; Tuberculosis: No; Never smoker; Marital Status - Married; Alcohol Use: Never; Drug Use: No History; Caffeine Use: Daily; Financial Concerns: No; Food, Clothing or Shelter Needs: No; Support System Lacking: No; Transportation Concerns: No Electronic Signature(s) Signed: 08/28/2022 10:16:59 AM By: Duanne Guess MD FACS Entered By: Duanne Guess on 08/28/2022 09:57:05 -------------------------------------------------------------------------------- SuperBill Details Patient Name: Date of Service: [name], [name] 08/28/2022 Medical Record Number: [id number] Patient Account Number: [id number] Date of Birth/Sex: Treating RN: [birth year] (52 y.o. 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 Facility Procedures : CPT4 Code:  [id number] Description: [phone] - DEBRIDE WOUND 1ST 20 SQ CM OR < ICD-10 Diagnosis Description L97.818 Non-pressure chronic ulcer of other part of right lower leg with other specified s Modifier: everity Quantity: 1 : CPT4 Code: [id number] Description: 62952 - DEBRIDE WOUND EA ADDL 20 SQ CM ICD-10 Diagnosis Description L97.818 Non-pressure chronic ulcer of other part of right lower leg with other specified s Modifier: everity Quantity: 1 Physician Procedures : CPT4 Code Description Modifier [id number] - WC PHYS LEVEL 4 - EST PT 25 ICD-10 Diagnosis Description L97.818 Non-pressure chronic ulcer of other part of right lower leg with other specified severity L97.822 Non-pressure chronic ulcer of other part  of left lower leg with fat layer exposed 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

## 2022-09-11 ENCOUNTER — Observation Stay (HOSPITAL_COMMUNITY)
Admission: AD | Admit: 2022-09-11 | Discharge: 2022-09-12 | Disposition: A | Discharge status: Home / Self Care | Payer: Medicaid Other | Source: Ambulatory Visit | Attending: Internal Medicine | Admitting: Internal Medicine

## 2022-09-11 ENCOUNTER — Telehealth (HOSPITAL_COMMUNITY): Payer: Self-pay

## 2022-09-11 ENCOUNTER — Ambulatory Visit (HOSPITAL_BASED_OUTPATIENT_CLINIC_OR_DEPARTMENT_OTHER): Payer: Medicaid Other | Admitting: General Surgery

## 2022-09-11 DIAGNOSIS — D649 Anemia, unspecified: Secondary | ICD-10-CM | POA: Diagnosis not present

## 2022-09-11 DIAGNOSIS — D57219 Sickle-cell/Hb-C disease with crisis, unspecified: Principal | ICD-10-CM | POA: Insufficient documentation

## 2022-09-11 DIAGNOSIS — R5383 Other fatigue: Secondary | ICD-10-CM | POA: Diagnosis present

## 2022-09-11 DIAGNOSIS — D5700 Hb-SS disease with crisis, unspecified: Secondary | ICD-10-CM | POA: Diagnosis not present

## 2022-09-11 LAB — CBC WITH DIFFERENTIAL/PLATELET
Abs Immature Granulocytes: 0.13 10*3/uL — ABNORMAL HIGH (ref 0.00–0.07)
Basophils Absolute: 0 10*3/uL (ref 0.0–0.1)
Basophils Relative: 0 %
Eosinophils Absolute: 0.2 10*3/uL (ref 0.0–0.5)
Eosinophils Relative: 2 %
HCT: 11 % — ABNORMAL LOW (ref 36.0–46.0)
Hemoglobin: 4.2 g/dL — CL (ref 12.0–15.0)
Immature Granulocytes: 1 %
Lymphocytes Relative: 4 %
Lymphs Abs: 0.5 10*3/uL — ABNORMAL LOW (ref 0.7–4.0)
MCH: 31.6 pg (ref 26.0–34.0)
MCHC: 38.2 g/dL — ABNORMAL HIGH (ref 30.0–36.0)
MCV: 82.7 fL (ref 80.0–100.0)
Monocytes Absolute: 1.4 10*3/uL — ABNORMAL HIGH (ref 0.1–1.0)
Monocytes Relative: 13 %
Neutro Abs: 8.6 10*3/uL — ABNORMAL HIGH (ref 1.7–7.7)
Neutrophils Relative %: 80 %
Platelets: 160 10*3/uL (ref 150–400)
RBC: 1.33 MIL/uL — ABNORMAL LOW (ref 3.87–5.11)
RDW: 25.9 % — ABNORMAL HIGH (ref 11.5–15.5)
WBC: 10.8 10*3/uL — ABNORMAL HIGH (ref 4.0–10.5)
nRBC: 56.2 % — ABNORMAL HIGH (ref 0.0–0.2)

## 2022-09-11 LAB — COMPREHENSIVE METABOLIC PANEL
ALT: 56 U/L — ABNORMAL HIGH (ref 0–44)
AST: 151 U/L — ABNORMAL HIGH (ref 15–41)
Albumin: 3.6 g/dL (ref 3.5–5.0)
Alkaline Phosphatase: 88 U/L (ref 38–126)
Anion gap: 8 (ref 5–15)
BUN: 17 mg/dL (ref 6–20)
CO2: 19 mmol/L — ABNORMAL LOW (ref 22–32)
Calcium: 7.4 mg/dL — ABNORMAL LOW (ref 8.9–10.3)
Chloride: 106 mmol/L (ref 98–111)
Creatinine, Ser: 0.6 mg/dL (ref 0.44–1.00)
GFR, Estimated: >60 mL/min (ref >60)
Glucose, Bld: 110 mg/dL — ABNORMAL HIGH (ref 70–99)
Potassium: 3.2 mmol/L — ABNORMAL LOW (ref 3.5–5.1)
Sodium: 133 mmol/L — ABNORMAL LOW (ref 135–145)
Total Bilirubin: 5.8 mg/dL — ABNORMAL HIGH (ref 0.3–1.2)
Total Protein: 6.6 g/dL (ref 6.5–8.1)

## 2022-09-11 LAB — TYPE AND SCREEN: Unit division: 0

## 2022-09-11 LAB — RETICULOCYTES
Immature Retic Fract: 50.9 % — ABNORMAL HIGH (ref 2.3–15.9)
RBC.: 1.3 MIL/uL — ABNORMAL LOW (ref 3.87–5.11)
Retic Count, Absolute: 185.5 10*3/uL (ref 19.0–186.0)
Retic Ct Pct: 14.3 % — ABNORMAL HIGH (ref 0.4–3.1)

## 2022-09-11 LAB — LACTATE DEHYDROGENASE: LDH: 723 U/L — ABNORMAL HIGH (ref 98–192)

## 2022-09-11 LAB — BPAM RBC: Blood Product Expiration Date: 202405172359

## 2022-09-11 LAB — PREPARE RBC (CROSSMATCH)

## 2022-09-11 MED ORDER — ONDANSETRON HCL 4 MG/2ML IJ SOLN: 4.0000 mg | Freq: Four times a day (QID) | INTRAMUSCULAR | Status: DC | PRN

## 2022-09-11 MED ORDER — DEFERIPRONE 500 MG PO TABS: 500.0000 mg | ORAL_TABLET | Freq: Every day | ORAL | Status: DC

## 2022-09-11 MED ORDER — FOLIC ACID 1 MG PO TABS
1.0000 mg | ORAL_TABLET | Freq: Every day | ORAL | Status: DC
  Administered 2022-09-11 – 2022-09-12 (×2): 1 mg via ORAL
  Filled 2022-09-11 (×2): qty 1

## 2022-09-11 MED ORDER — DIPHENHYDRAMINE HCL 50 MG/ML IJ SOLN
25.0000 mg | Freq: Once | INTRAMUSCULAR | Status: AC
  Administered 2022-09-11: 25 mg via INTRAVENOUS
  Filled 2022-09-11: qty 1

## 2022-09-11 MED ORDER — KETOROLAC TROMETHAMINE 15 MG/ML IJ SOLN
15.0000 mg | Freq: Four times a day (QID) | INTRAMUSCULAR | Status: DC
  Administered 2022-09-11 – 2022-09-12 (×4): 15 mg via INTRAVENOUS
  Filled 2022-09-11 (×4): qty 1

## 2022-09-11 MED ORDER — POLYETHYLENE GLYCOL 3350 17 G PO PACK: 17.0000 g | PACK | Freq: Every day | ORAL | Status: DC | PRN

## 2022-09-11 MED ORDER — SENNOSIDES-DOCUSATE SODIUM 8.6-50 MG PO TABS
1.0000 | ORAL_TABLET | Freq: Two times a day (BID) | ORAL | Status: DC
  Administered 2022-09-11 – 2022-09-12 (×2): 1 via ORAL
  Filled 2022-09-11 (×2): qty 1

## 2022-09-11 MED ORDER — ASPIRIN 81 MG PO TBEC
81.0000 mg | DELAYED_RELEASE_TABLET | Freq: Every day | ORAL | Status: DC
  Administered 2022-09-11 – 2022-09-12 (×2): 81 mg via ORAL
  Filled 2022-09-11 (×2): qty 1

## 2022-09-11 MED ORDER — ACETAMINOPHEN 500 MG PO TABS
1000.0000 mg | ORAL_TABLET | Freq: Once | ORAL | Status: AC
  Administered 2022-09-11: 1000 mg via ORAL
  Filled 2022-09-11: qty 2

## 2022-09-11 MED ORDER — SODIUM CHLORIDE 0.45 % IV SOLN: INTRAVENOUS | Status: DC

## 2022-09-11 MED ORDER — OXYCODONE HCL 5 MG PO TABS
10.0000 mg | ORAL_TABLET | ORAL | Status: DC | PRN
  Administered 2022-09-11 – 2022-09-12 (×3): 10 mg via ORAL
  Filled 2022-09-11 (×3): qty 2

## 2022-09-11 MED ORDER — KETOROLAC TROMETHAMINE 30 MG/ML IJ SOLN
15.0000 mg | Freq: Once | INTRAMUSCULAR | Status: AC
  Administered 2022-09-11: 15 mg via INTRAVENOUS
  Filled 2022-09-11: qty 1

## 2022-09-11 MED ORDER — SODIUM CHLORIDE 0.9% IV SOLUTION: Freq: Once | INTRAVENOUS | Status: AC

## 2022-09-11 MED ORDER — GABAPENTIN 100 MG PO CAPS
100.0000 mg | ORAL_CAPSULE | Freq: Two times a day (BID) | ORAL | Status: DC
  Administered 2022-09-11 – 2022-09-12 (×2): 100 mg via ORAL
  Filled 2022-09-11 (×2): qty 1

## 2022-09-11 NOTE — Progress Notes (Signed)
Pt admitted to the day hospital by [place] Hollis FNP for treatment. Pt reports no pain today, states she is dizzy and feeling weak since yesterday. Pt had an o2 saturation of 74% on arrival on room air, placed on 3L [place], sats improved to 98%, provider made aware.  Pt given PO Tylenol, IV toradol  and hydrated with 1/2 NS IV fluids. Joni Reining in lab reported a critical Hgb 4.2, provider made aware. Orders placed for 2 units PRBCs and hospital admission. Consent for blood products obtained. Pt premedicated with IV benadryl. Pt received 1 units PRBC via PIV in the [facility] without difficulty. Pt to receive second unit while admitted pe rprovider. Report called to [facility]. Pt taken in wheelchair to 6E Bed 21 . Pt stable, alert , oriented and ambulatory with cane at hopsital transfer.

## 2022-09-11 NOTE — Progress Notes (Signed)
Received report from Clydene Fake at the sickle cell clinic.

## 2022-09-11 NOTE — H&P (Signed)
[facility] History and Physical   Date: 09/11/2022  Patient name: [name] Medical record number: [id number] Date of birth: 09-Mar-1971 Age: 52 y.o. Gender: female PCP: Massie Maroon, FNP  Attending physician: Quentin Angst, MD  Chief Complaint: Fatigue, dizziness  History of Present Illness: [name] is a 52 year old female with a medical history significant for sickle cell disease, chronic pain syndrome, bilateral lower extremity ulcers, and anemia of chronic disease presents with complaints of increased fatigue, dizziness, and shortness of breath with exertion.  Patient states that symptoms began on yesterday morning and have been worsening since that time.  Patient states that she has felt like this in the past when her hemoglobin was low.  Patient says that pain has been mostly controlled on her home oxycodone.  She rates her pain as 4/10, which is consistent with her baseline.  She last had oxycodone this morning with maximal relief.  Patient denies any hematochezia, hematuria, or melena.  No urinary symptoms, nausea, vomiting, or diarrhea.  No chest pain, headache, blurry vision, or paresthesias.  No sick contacts, recent travel, or known exposure to COVID-19.  Sickle cell day infusion center course: Patient admitted to sickle cell day infusion clinic for symptomatic anemia in the setting of sickle cell disease.  Complete blood count notable for a hemoglobin of 4.0 g/dL.  Patient's baseline is around 6-7 g/dL.  Also, WBCs 10.8, and platelets 160,000.  Reticulocyte percentage 14.3.  Complete blood count notable for AST 151, ALT 56, and total bilirubin 5.8.  Transfuse 1 unit PRBCs while at the infusion center.  Patient will transition to [facility] long hospital for further management of symptomatic anemia in the setting of sickle cell disease.  Meds: Medications Prior to Admission  Medication Sig Dispense Refill Last Dose   aspirin EC 81 MG tablet Take 1  tablet (81 mg total) by mouth daily. Swallow whole. 30 tablet 12    Deferiprone 500 MG TABS Take 500 mg by mouth daily at 12 noon. (Patient not taking: Reported on 07/25/2021) 90 tablet 2    folic acid (FOLVITE) 1 MG tablet Take 5 tablets (5 mg total) by mouth daily. (Patient taking differently: Take 1 mg by mouth daily.) 150 tablet 11    gabapentin (NEURONTIN) 300 MG capsule TAKE 1 CAPSULE(300 MG) BY MOUTH THREE TIMES DAILY 90 capsule 2    ibuprofen (ADVIL) 600 MG tablet TAKE 1 TABLET(600 MG) BY MOUTH EVERY 6 HOURS AS NEEDED FOR MILD PAIN 30 tablet 3    Nutritional Supplements (JUVEN) POWD Take 1 each by mouth 3 (three) times daily between meals. (Patient not taking: Reported on 09/11/2021) 545 g 6    Oxycodone HCl 10 MG TABS Take 1 tablet (10 mg total) by mouth every 4 (four) hours while awake. 90 tablet 0    polyethylene glycol powder (GLYCOLAX/MIRALAX) 17 GM/SCOOP powder Take 17 g by mouth 2 (two) times daily as needed. 3350 g 1    voxelotor (OXBRYTA) 500 MG TABS tablet Take 2 tablets (1,000 mg) by mouth daily. (Patient not taking: Reported on 03/19/2022) 90 tablet 1     Allergies: Patient has no known allergies. Past Medical History:  Diagnosis Date   Anemia 03/30/2012   Hx of sickle cell disease   CAP (community acquired pneumonia)    2014   Cholelithiasis 03/30/2012   Chronic wound of extremity 04/01/2012   Elevated LFTs    Leg ulcer (HCC) 10/27/2012   Chronic under care of wound  clinic   Multiple open wounds of lower extremity    chronic wounds B/LLE   Sickle cell disease (HCC)    Sinus bradycardia by electrocardiogram 04/03/2012   Past Surgical History:  Procedure Laterality Date   ALLOGRAFT APPLICATION Right 02/14/2015   Procedure: SURGICAL PREP FOR GRAFTING RIGHT LOWER EXTREMITY AND APPLICATION OF Marcellus Scott;  Surgeon: Glenna Fellows, MD;  Location: [facility];  Service: Plastics;  Laterality: Right;   APPLICATION OF A-CELL OF EXTREMITY Right 10/09/2015    Procedure: APPLICATION OF Marcellus Scott;  Surgeon: Glenna Fellows, MD;  Location: [facility];  Service: Plastics;  Laterality: Right;   BREAST SURGERY     left breast cyst aspiration   CESAREAN SECTION     CESAREAN SECTION N/A 01/06/2014   Procedure: CESAREAN SECTION;  Surgeon: Allie Bossier, MD;  Location: [facility];  Service: Obstetrics;  Laterality: N/A;   CESAREAN SECTION N/A 05/04/2017   Procedure: CESAREAN SECTION;  Surgeon: Adam Phenix, MD;  Location: [facility];  Service: Obstetrics;  Laterality: N/A;   I & D EXTREMITY Right 10/09/2015   Procedure: SURGICAL PREPARATION FOR GRAFTING RIGHT ANKLE AND APPLICATION THERASKIN;  Surgeon: Glenna Fellows, MD;  Location: [facility];  Service: Plastics;  Laterality: Right;   SKIN FULL THICKNESS GRAFT Bilateral 06/17/2016   Procedure: SURGICAL PREP FOR GRAFTING, BILATERAL LOWER EXTREMITIES AND APPLICATION OF Marcellus Scott;  Surgeon: Glenna Fellows, MD;  Location: [facility];  Service: Plastics;  Laterality: Bilateral;   TONSILLECTOMY     Family History  Problem Relation Age of Onset   Sickle cell anemia Sister    Diabetes Mother    Asthma Mother    Sickle cell trait Mother    Sickle cell trait Father    Hypertension Father    Social History   Socioeconomic History   Marital status: Married    Spouse name: Arvilla Meres   Number of children: 1   Years of education: Not on file   Highest education level: Not on file  Occupational History   Occupation: Employed in home care.  Tobacco Use   Smoking status: Never    Passive exposure: Never   Smokeless tobacco: Never  Vaping Use   Vaping Use: Never used  Substance and Sexual Activity   Alcohol use: No   Drug use: No   Sexual activity: Yes    Birth control/protection: None  Other Topics Concern   Not on file  Social History Narrative   Lives with husband.   Social Determinants of Health   Financial Resource Strain: Not on file  Food Insecurity:  Not on file  Transportation Needs: Not on file  Physical Activity: Not on file  Stress: Not on file  Social Connections: Not on file  Intimate Partner Violence: Not on file    Review of Systems: Review of Systems  Constitutional:  Positive for malaise/fatigue. Negative for fever and weight loss.  HENT: Negative.    Eyes:  Negative for blurred vision and double vision.  Respiratory:  Positive for shortness of breath. Negative for wheezing.   Cardiovascular:  Negative for chest pain.  Gastrointestinal:  Negative for blood in stool, constipation and diarrhea.  Genitourinary:  Negative for hematuria.  Musculoskeletal:  Positive for joint pain.  Skin: Negative.   Neurological:  Positive for dizziness.  Psychiatric/Behavioral: Negative.       Physical Exam: There were no vitals taken for this visit. BP (!) 103/57 (BP Location: Left Arm)   Pulse 62  Temp 98.8 F (37.1 C) (Temporal)   Resp 16   LMP 06/20/2022 (Approximate) Comment: pt states she gets periods occassionally  SpO2 97%   General Appearance:    Alert, cooperative, no distress, ill appearing  Head:    Normocephalic, without obvious abnormality, atraumatic  Eyes:    PERRL, conjunctiva/corneas clear, EOM's intact, fundi    benign, both eyes  Ears:    Normal TM's and external ear canals, both ears  Back:     Symmetric, no curvature, ROM normal, no CVA tenderness  Lungs:     Clear to auscultation bilaterally, respirations unlabored  Chest Wall:    No tenderness or deformity   Heart:    Regular rate and rhythm, S1 and S2 normal, no murmur, rub   or gallop  Abdomen:     Soft, non-tender, bowel sounds active all four quadrants,    no masses, no organomegaly  Extremities:   Extremities normal, atraumatic, no cyanosis or edema  Pulses:   2+ and symmetric all extremities  Skin:   Skin color, texture, turgor normal, no rashes or lesions  Lymph nodes:   Cervical, supraclavicular, and axillary nodes normal  Neurologic:    CNII-XII intact, normal strength, sensation and reflexes    throughout    Lab results: No results found for this or any previous visit (from the past 24 hour(s)).  Imaging results:  No results found.  Symptomatic anemia: On admission, patient's hemoglobin 4.0 g/dL, LDH markedly elevated at 723.  Total bilirubin 5.8..  Will transfuse a total of 2 units PRBCs.  Continue folic acid 1 mg daily.  Sickle cell disease with pain: Patient has chronic pain.  Will continue her home medications of oxycodone 10 mg every 4 hours as needed for severe breakthrough pain Toradol 15 mg IV every 6 hours  Hypokalemia: Potassium decrease.  Replete.  Follow labs in AM.  Transaminitis/hyperbilirubinemia: Liver enzymes and total bilirubin elevated.  More than likely secondary to sickle cell crisis.  Will continue to monitor closely.   Nolon Nations  APRN, MSN, FNP-C Patient Care [facility] [address] [place], [place] [address] [phone]  09/11/2022, 9:58 AM

## 2022-09-11 NOTE — Telephone Encounter (Signed)
Patient called in. Complains of dizziness and weakness since yesterday, pt states she is not having pain today; however took pain medication  at 6am. Pt unable to rate pain using pain scale. Denied chest pain, abd pain, fever, diarrhea, endorses 1x episode of vomiting this morning . Wants to come in for treatment and to check hemoglobin. Pt states her husband is her transportation to and from center today. Pt denies covid exposure or flu like symptoms today. [place] FNP notified, approved pt to be seen in the day hospital. Pt made aware, verbalized understanding.

## 2022-09-12 DIAGNOSIS — D5700 Hb-SS disease with crisis, unspecified: Secondary | ICD-10-CM | POA: Diagnosis not present

## 2022-09-12 DIAGNOSIS — D57219 Sickle-cell/Hb-C disease with crisis, unspecified: Secondary | ICD-10-CM | POA: Diagnosis not present

## 2022-09-12 LAB — BASIC METABOLIC PANEL
Anion gap: 8 (ref 5–15)
BUN: 11 mg/dL (ref 6–20)
CO2: 21 mmol/L — ABNORMAL LOW (ref 22–32)
Calcium: 7.5 mg/dL — ABNORMAL LOW (ref 8.9–10.3)
Chloride: 110 mmol/L (ref 98–111)
Creatinine, Ser: 0.53 mg/dL (ref 0.44–1.00)
GFR, Estimated: >60 mL/min (ref >60)
Glucose, Bld: 94 mg/dL (ref 70–99)
Potassium: 3.4 mmol/L — ABNORMAL LOW (ref 3.5–5.1)
Sodium: 139 mmol/L (ref 135–145)

## 2022-09-12 LAB — TYPE AND SCREEN
ABO/RH(D): B POS
Antibody Screen: NEGATIVE
Unit division: 0

## 2022-09-12 LAB — CBC
HCT: 18.7 % — ABNORMAL LOW (ref 36.0–46.0)
Hemoglobin: 6.8 g/dL — CL (ref 12.0–15.0)
MCH: 30.6 pg (ref 26.0–34.0)
MCHC: 36.4 g/dL — ABNORMAL HIGH (ref 30.0–36.0)
MCV: 84.2 fL (ref 80.0–100.0)
Platelets: 218 10*3/uL (ref 150–400)
RBC: 2.22 MIL/uL — ABNORMAL LOW (ref 3.87–5.11)
RDW: 22.3 % — ABNORMAL HIGH (ref 11.5–15.5)
WBC: 8.2 10*3/uL (ref 4.0–10.5)
nRBC: 58.3 % — ABNORMAL HIGH (ref 0.0–0.2)

## 2022-09-12 LAB — BPAM RBC
Blood Product Expiration Date: 202405232359
ISSUE DATE / TIME: 202404241418
ISSUE DATE / TIME: 202404241835
Unit Type and Rh: 5100
Unit Type and Rh: 5100

## 2022-09-12 LAB — HIV ANTIBODY (ROUTINE TESTING W REFLEX): HIV Screen 4th Generation wRfx: NONREACTIVE

## 2022-09-12 NOTE — Progress Notes (Signed)
Patient states she would like to go after 2 pm when husband can get here.

## 2022-09-12 NOTE — Discharge Summary (Signed)
Physician Discharge Summary  [name] ZOX:[id number] DOB: 10-23-1970 DOA: 09/11/2022  PCP: Massie Maroon, FNP  Admit date: 09/11/2022  Discharge date: 09/13/2022  Discharge Diagnoses:  Principal Problem:   Sickle cell pain crisis [facility]) Active Problems:   Symptomatic anemia   Discharge Condition: Stable  Disposition:   Follow-up Information     Massie Maroon, FNP Follow up.   Specialty: Family Medicine Contact information: [address] [place] [address] [phone]                Pt is discharged home in good condition and is to follow up with Massie Maroon, FNP this week to have labs evaluated. Mckinzi Eriksen is instructed to increase activity slowly and balance with rest for the next few days, and use prescribed medication to complete treatment of pain  Diet: Regular Wt Readings from Last 3 Encounters:  06/18/22 60.9 kg  03/19/22 58.5 kg  12/18/21 59.9 kg   History of Present Illness: [name] is a 52 year old female with a medical history significant for sickle cell disease, chronic pain syndrome, bilateral lower extremity ulcers, and anemia of chronic disease presents with complaints of increased fatigue, dizziness, and shortness of breath with exertion.  Patient states that symptoms began on yesterday morning and have been worsening since that time.  Patient states that she has felt like this in the past when her hemoglobin was low.  Patient says that pain has been mostly controlled on her home oxycodone.  She rates her pain as 4/10, which is consistent with her baseline.  She last had oxycodone this morning with maximal relief.  Patient denies any hematochezia, hematuria, or melena.  No urinary symptoms, nausea, vomiting, or diarrhea.  No chest pain, headache, blurry vision, or paresthesias.  No sick contacts, recent travel, or known exposure to COVID-19.   Sickle cell day infusion center course: Patient admitted to sickle  cell day infusion clinic for symptomatic anemia in the setting of sickle cell disease.  Complete blood count notable for a hemoglobin of 4.0 g/dL.  Patient's baseline is around 6-7 g/dL.  Also, WBCs 10.8, and platelets 160,000.  Reticulocyte percentage 14.3.  Complete blood count notable for AST 151, ALT 56, and total bilirubin 5.8.  Transfuse 1 unit PRBCs while at the infusion center.  Patient will transition to [facility] long hospital for further management of symptomatic anemia in the setting of sickle cell disease.    Hospital Course:  Symptomatic anemia: On admission, patient's hemoglobin was 4.2 g/dL on admission, which is below patient's baseline of 7-8 g/dL.  Patient received 2 units PRBCs without complication.  Her hemoglobin increased to 6.8 g/dL, which is slight below her baseline. Patient states that she is not having any pain at this time.  Patient denies shortness of breath.  She continues to feel slightly fatigued.  She feels that she can manage at home on current medication regimen. Patient will return to clinic on 09/16/2022 to repeat CBC with differential, LDH, and CMP. Patient was therefore discharged home today in a hemodynamically stable condition.   Ayslin will follow-up with PCP within 1 week of this discharge. Roshaunda was counseled extensively about nonpharmacologic means of pain management, patient verbalized understanding and was appreciative of  the care received during this admission.   We discussed the need for good hydration, monitoring of hydration status, avoidance of heat, cold, stress, and infection triggers.  Patient was reminded of the need to seek medical attention  immediately if any symptom of bleeding, anemia, or infection occurs.  Discharge Exam: Vitals:   09/12/22 1008 09/12/22 1325  BP: 124/71 118/66  Pulse: 68 61  Resp: 18 18  Temp: 97.8 F (36.6 C) 98.2 F (36.8 C)  SpO2: 94% 97%   Vitals:   09/11/22 2131 09/12/22 0258 09/12/22 1008 09/12/22 1325  BP:  125/71 108/70 124/71 118/66  Pulse: 73 (!) 57 68 61  Resp:  18 18 18   Temp: 98.3 F (36.8 C) 98.2 F (36.8 C) 97.8 F (36.6 C) 98.2 F (36.8 C)  TempSrc: Oral Oral Oral Oral  SpO2: 92% 97% 94% 97%    General appearance : Awake, alert, not in any distress. Speech Clear. Not toxic looking HEENT: Atraumatic and Normocephalic, pupils equally reactive to light and accomodation. Scleral icterus.  Neck: Supple, no JVD. No cervical lymphadenopathy.  Chest: Good air entry bilaterally, no added sounds  CVS: S1 S2 regular, no murmurs.  Abdomen: Bowel sounds present, Non tender and not distended with no gaurding, rigidity or rebound. Extremities: B/L Lower Ext shows no edema, both legs are warm to touch Neurology: Awake alert, and oriented X 3, CN II-XII intact, Non focal Skin: No Rash  Discharge Instructions  Discharge Instructions     Discharge patient   Complete by: As directed    Discharge disposition: 01-Home or Self Care   Discharge patient date: 09/12/2022      Allergies as of 09/12/2022   No Known Allergies      Medication List     TAKE these medications    aspirin EC 81 MG tablet Take 1 tablet (81 mg total) by mouth daily. Swallow whole.   Deferiprone 500 MG Tabs Take 500 mg by mouth daily at 12 noon.   folic acid 1 MG tablet Commonly known as: FOLVITE Take 5 tablets (5 mg total) by mouth daily. What changed: how much to take   gabapentin 300 MG capsule Commonly known as: NEURONTIN TAKE 1 CAPSULE(300 MG) BY MOUTH THREE TIMES DAILY What changed:  how much to take how to take this when to take this additional instructions   ibuprofen 600 MG tablet Commonly known as: ADVIL TAKE 1 TABLET(600 MG) BY MOUTH EVERY 6 HOURS AS NEEDED FOR MILD PAIN What changed: See the new instructions.   Juven Powd Take 1 each by mouth 3 (three) times daily between meals.   Oxbryta 500 MG Tabs tablet Generic drug: voxelotor Take 2 tablets (1,000 mg) by mouth daily.    Oxycodone HCl 10 MG Tabs Take 1 tablet (10 mg total) by mouth every 4 (four) hours while awake. What changed: when to take this   polyethylene glycol powder 17 GM/SCOOP powder Commonly known as: GLYCOLAX/MIRALAX Take 17 g by mouth 2 (two) times daily as needed. What changed: reasons to take this        The results of significant diagnostics from this hospitalization (including imaging, microbiology, ancillary and laboratory) are listed below for reference.    Significant Diagnostic Studies: No results found.  Microbiology: No results found for this or any previous visit (from the past 240 hour(s)).   Labs: Basic Metabolic Panel: Recent Labs  Lab 09/11/22 1140 09/12/22 0601  NA 133* 139  K 3.2* 3.4*  CL 106 110  CO2 19* 21*  GLUCOSE 110* 94  BUN 17 11  CREATININE 0.60 0.53  CALCIUM 7.4* 7.5*   Liver Function Tests: Recent Labs  Lab 09/11/22 1140  AST 151*  ALT 56*  ALKPHOS  88  BILITOT 5.8*  PROT 6.6  ALBUMIN 3.6   No results for input(s): "LIPASE", "AMYLASE" in the last 168 hours. No results for input(s): "AMMONIA" in the last 168 hours. CBC: Recent Labs  Lab 09/11/22 1140 09/12/22 0601  WBC 10.8* 8.2  NEUTROABS 8.6*  --   HGB 4.2* 6.8*  HCT 11.0* 18.7*  MCV 82.7 84.2  PLT 160 218   Cardiac Enzymes: No results for input(s): "CKTOTAL", "CKMB", "CKMBINDEX", "TROPONINI" in the last 168 hours. BNP: Invalid input(s): "POCBNP" CBG: No results for input(s): "GLUCAP" in the last 168 hours.  Time coordinating discharge: 30 minutes  Signed:  Nolon Nations  APRN, MSN, FNP-C Patient Care [facility] [address] [place], [place] [address] [phone]  [facility] 09/13/2022, 12:50 PM

## 2022-09-13 ENCOUNTER — Telehealth: Payer: Self-pay

## 2022-09-13 NOTE — Transitions of Care (Post Inpatient/ED Visit) (Unsigned)
   09/13/2022  Name: [name] MRN: [id number] DOB: [birth year]  Today's TOC FU Call Status: Today's TOC FU Call Status:: Unsuccessul Call (1st Attempt) Unsuccessful Call (1st Attempt) Date: 09/13/22  Attempted to reach the patient regarding the most recent Inpatient/ED visit.  Follow Up Plan: Additional outreach attempts will be made to reach the patient to complete the Transitions of Care (Post Inpatient/ED visit) call.   Signature Karena Addison, LPN [facility] Nurse Health Advisor Direct Dial [phone]

## 2022-09-16 ENCOUNTER — Other Ambulatory Visit: Payer: Self-pay | Admitting: Family Medicine

## 2022-09-16 DIAGNOSIS — G894 Chronic pain syndrome: Secondary | ICD-10-CM

## 2022-09-16 NOTE — Telephone Encounter (Signed)
Please advise KH 

## 2022-09-17 NOTE — Transitions of Care (Post Inpatient/ED Visit) (Unsigned)
   09/17/2022  Name: [name] MRN: [id number] DOB: 1971-01-21  Today's TOC FU Call Status: Today's TOC FU Call Status:: Unsuccessful Call (2nd Attempt) Unsuccessful Call (1st Attempt) Date: 09/13/22 Unsuccessful Call (2nd Attempt) Date: 09/17/22  Attempted to reach the patient regarding the most recent Inpatient/ED visit.  Follow Up Plan: Additional outreach attempts will be made to reach the patient to complete the Transitions of Care (Post Inpatient/ED visit) call.   Signature Karena Addison, LPN [facility] Nurse Health Advisor Direct Dial [phone]

## 2022-09-18 NOTE — Transitions of Care (Post Inpatient/ED Visit) (Signed)
09/18/2022  Name: [name] MRN: [id number] DOB: 28-Mar-1971  Today's TOC FU Call Status: Today's TOC FU Call Status:: Successful TOC FU Call Competed Unsuccessful Call (1st Attempt) Date: 09/13/22 Unsuccessful Call (2nd Attempt) Date: 09/17/22 [facility] FU Call Complete Date: 09/18/22  Transition Care Management Follow-up Telephone Call Date of Discharge: 09/12/22 Discharge Facility: [facility]) Type of Discharge: Inpatient Admission Primary Inpatient Discharge Diagnosis:: anemia How have you been since you were released from the hospital?: Better Any questions or concerns?: No  Items Reviewed: Did you receive and understand the discharge instructions provided?: Yes Medications obtained,verified, and reconciled?: Yes (Medications Reviewed) Any new allergies since your discharge?: No Dietary orders reviewed?: Yes Do you have support at home?: Yes People in Home: spouse  Medications Reviewed Today: Medications Reviewed Today     Reviewed by Karena Addison, LPN (Licensed Practical Nurse) on 09/18/22 at 1613  Med List Status: <None>   Medication Order Taking? Sig Documenting Provider Last Dose Status Informant  aspirin EC 81 MG tablet [id number] Yes Take 1 tablet (81 mg total) by mouth daily. Swallow whole. Massie Maroon, FNP Taking Active Self  Deferiprone 500 MG TABS [id number] No Take 500 mg by mouth daily at 12 noon.  Patient not taking: Reported on 07/25/2021   Massie Maroon, FNP Not Taking Active Self  folic acid (FOLVITE) 1 MG tablet [id number] Yes Take 5 tablets (5 mg total) by mouth daily.  Patient taking differently: Take 1 mg by mouth daily.   Tereso Newcomer, MD Taking Active Self  gabapentin (NEURONTIN) 300 MG capsule [id number] Yes TAKE 1 CAPSULE(300 MG) BY MOUTH THREE TIMES DAILY Massie Maroon, FNP Taking Active   ibuprofen (ADVIL) 600 MG tablet [id number] Yes TAKE 1 TABLET(600 MG) BY MOUTH EVERY 6 HOURS AS NEEDED FOR MILD PAIN  Patient taking  differently: Take 600 mg by mouth every 6 (six) hours as needed for mild pain.   Ivonne Andrew, NP Taking Active Self  Nutritional Supplements [facility]) POWD [id number] Yes Take 1 each by mouth 3 (three) times daily between meals. Massie Maroon, FNP Taking Active Self  Oxycodone HCl 10 MG TABS [id number] Yes Take 1 tablet (10 mg total) by mouth every 4 (four) hours while awake.  Patient taking differently: Take 10 mg by mouth every 4 (four) hours.   Massie Maroon, FNP Taking Active Self           Med Note (SATTERFIELD, DARIUS E   Wed Sep 11, 2022  5:31 PM) Patient verified she is taking every 4 hours per patient   polyethylene glycol powder (GLYCOLAX/MIRALAX) 17 GM/SCOOP powder [id number] Yes Take 17 g by mouth 2 (two) times daily as needed.  Patient taking differently: Take 17 g by mouth 2 (two) times daily as needed for mild constipation.   Massie Maroon, FNP Taking Active Self  voxelotor (OXBRYTA) 500 MG TABS tablet [id number] Yes Take 2 tablets (1,000 mg) by mouth daily. Massie Maroon, FNP Taking Active Self            Home Care and Equipment/Supplies: Were Home Health Services Ordered?: NA Any new equipment or medical supplies ordered?: NA  Functional Questionnaire: Do you need assistance with bathing/showering or dressing?: No Do you need assistance with meal preparation?: No Do you need assistance with eating?: No Do you have difficulty maintaining continence: No Do you need assistance with getting out of bed/getting out of a chair/moving?: No Do you have difficulty managing or taking your  medications?: No  Follow up appointments reviewed: PCP Follow-up appointment confirmed?: Yes Date of PCP follow-up appointment?: 09/24/22 Follow-up Provider: Cresenciano Genre Follow-up appointment confirmed?: NA Do you need transportation to your follow-up appointment?: No Do you understand care options if your condition(s) worsen?: Yes-patient verbalized  understanding    SIGNATURE Karena Addison, LPN [facility] Nurse Health Advisor Direct Dial [phone]

## 2022-09-24 ENCOUNTER — Encounter: Payer: Self-pay | Admitting: Family Medicine

## 2022-09-24 ENCOUNTER — Ambulatory Visit: Payer: Medicaid Other | Admitting: Family Medicine

## 2022-09-24 VITALS — BP 114/51 | HR 79 | Temp 97.2°F | Ht 67.0 in | Wt 131.2 lb

## 2022-09-24 DIAGNOSIS — G894 Chronic pain syndrome: Secondary | ICD-10-CM | POA: Diagnosis not present

## 2022-09-24 DIAGNOSIS — R051 Acute cough: Secondary | ICD-10-CM

## 2022-09-24 DIAGNOSIS — J029 Acute pharyngitis, unspecified: Secondary | ICD-10-CM | POA: Diagnosis not present

## 2022-09-24 DIAGNOSIS — D571 Sickle-cell disease without crisis: Secondary | ICD-10-CM | POA: Diagnosis not present

## 2022-09-24 DIAGNOSIS — E559 Vitamin D deficiency, unspecified: Secondary | ICD-10-CM | POA: Diagnosis not present

## 2022-09-24 LAB — POCT URINALYSIS DIPSTICK
Bilirubin, UA: NEGATIVE
Blood, UA: NEGATIVE
Glucose, UA: NEGATIVE
Ketones, UA: NEGATIVE
Leukocytes, UA: NEGATIVE
Nitrite, UA: NEGATIVE
Protein, UA: NEGATIVE
Spec Grav, UA: 1.01 (ref 1.010–1.025)
Urobilinogen, UA: 0.2 E.U./dL
pH, UA: 5.5 (ref 5.0–8.0)

## 2022-09-24 NOTE — Progress Notes (Signed)
Established Patient Office Visit  Subjective   Patient ID: [name], female    DOB: 1970/06/07  Age: 52 y.o. MRN: [id number]  Chief Complaint  Patient presents with   Sickle Cell Anemia    Follow up    [name] is a 52 year old female with a medical history significant for sickle cell disease, chronic pain syndrome, opiate dependence and tolerance, anemia of chronic disease, and bilateral lower extremity leg ulcers presents for follow-up of chronic conditions. Patient states that pain intensity has been worsening to her lower extremities over the past several weeks.  Patient has ongoing leg ulcers.  She is followed by wound care and has dressings changed consistently.  Patient has not had very much improvement with her leg ulcers. Current pain intensity is 6/10.  She last had oxycodone and ibuprofen this a.m. with moderate relief. Patient endorses some fatigue.  She denies any headache, chest pain, shortness of breath, urinary symptoms, nausea, vomiting, or diarrhea. Patient is complaining of sore throat and periodic coughing over the past several days.  She has had sick contacts.  Her children have been "under the weather".  They have not been evaluated for COVID-19 or influenza.  Patient has not attempted any over-the-counter interventions to alleviate this problem.  Patient denies any fever, chills, headache, runny nose, or ear pain.    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   Sickle-cell disease with pain (HCC) 09/03/2018   Other chronic  pain    Sickle cell crisis (HCC) 06/26/2018   Sickle cell pain crisis (HCC) 04/07/2018   Avascular necrosis of bone of hip, left (HCC) 12/08/2017   Avascular necrosis of bone of hip, right (HCC) 12/08/2017   Hb-SS disease without crisis (HCC) 11/26/2017   Anemia of chronic disease 11/26/2017   Abnormal quad screen    Ringworm of body 04/05/2017   Ulcer of right lower extremity (HCC)    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HCC) 03/02/2017   History of pre-eclampsia 12/16/2016   Previous cesarean delivery x 2 07/22/2013   Hemochromatosis 11/10/2012   Chronic pain syndrome 10/13/2012   Complicated wound infection 04/01/2012   Leukocytosis 03/30/2012   Past Medical History:  Diagnosis Date   Anemia 03/30/2012   Hx of sickle cell disease   CAP (community acquired pneumonia)    2014   Cholelithiasis 03/30/2012   Chronic wound of extremity 04/01/2012   Elevated LFTs    Leg ulcer (HCC) 10/27/2012   Chronic under care of wound clinic   Multiple open wounds of lower extremity    chronic wounds B/LLE   Sickle cell disease (HCC)    Sinus bradycardia by electrocardiogram 04/03/2012   Past Surgical History:  Procedure Laterality Date   ALLOGRAFT APPLICATION Right 02/14/2015   Procedure: SURGICAL PREP FOR GRAFTING RIGHT LOWER EXTREMITY AND APPLICATION OF Marcellus Scott;  Surgeon: Glenna Fellows, MD;  Location: [facility];  Service: Plastics;  Laterality: Right;   APPLICATION OF A-CELL OF EXTREMITY Right 10/09/2015   Procedure: APPLICATION OF Marcellus Scott;  Surgeon: Glenna Fellows, MD;  Location: [facility];  Service: Plastics;  Laterality: Right;   BREAST SURGERY     left breast cyst aspiration   CESAREAN SECTION     CESAREAN SECTION N/A 01/06/2014   Procedure: CESAREAN SECTION;  Surgeon: Allie Bossier, MD;  Location: [facility];  Service: Obstetrics;  Laterality: N/A;   CESAREAN SECTION N/A 05/04/2017   Procedure: CESAREAN SECTION;  Surgeon: Adam Phenix, MD;   Location: [facility];  Service: Obstetrics;  Laterality: N/A;   I & D EXTREMITY Right 10/09/2015   Procedure: SURGICAL PREPARATION FOR GRAFTING RIGHT ANKLE AND APPLICATION THERASKIN;  Surgeon: Glenna Fellows, MD;  Location: [facility];  Service: Plastics;  Laterality: Right;   SKIN FULL THICKNESS GRAFT Bilateral 06/17/2016   Procedure: SURGICAL PREP FOR GRAFTING, BILATERAL LOWER EXTREMITIES AND APPLICATION OF Marcellus Scott;  Surgeon: Glenna Fellows, MD;  Location: [facility];  Service: Plastics;  Laterality: Bilateral;   TONSILLECTOMY     Social History   Tobacco Use   Smoking status: Never    Passive exposure: Never   Smokeless tobacco: Never  Vaping Use   Vaping Use: Never used  Substance Use Topics   Alcohol use: No   Drug use: No   Social History   Socioeconomic History   Marital status: Married    Spouse name: Arvilla Meres   Number of children: 1   Years of education: Not on file   Highest education level: Not on file  Occupational History   Occupation: Employed in home care.  Tobacco Use   Smoking status: Never    Passive exposure: Never   Smokeless tobacco: Never  Vaping Use   Vaping Use: Never used  Substance and Sexual Activity   Alcohol use: No   Drug use: No   Sexual activity: Yes    Birth control/protection: None  Other Topics Concern   Not on file  Social History Narrative   Lives with husband.   Social Determinants of Health   Financial Resource Strain: Not on file  Food Insecurity: Not on file  Transportation Needs: Not on file  Physical Activity: Not on file  Stress: Not on file  Social Connections: Not on file  Intimate Partner Violence: Not on file   Family Status  Relation Name Status   Sister  (Not Specified)   Mother  Alive   Father  Deceased   Family History  Problem Relation Age of Onset   Sickle cell anemia Sister    Diabetes Mother    Asthma Mother    Sickle cell trait Mother    Sickle cell trait Father     Hypertension Father    No Known Allergies    Review of Systems  Constitutional: Negative.   HENT: Negative.    Eyes: Negative.   Respiratory: Negative.    Cardiovascular: Negative.   Skin: Negative.       Objective:     BP (!) 114/51   Pulse 79   Temp (!) 97.2 F (36.2 C)   Ht 5\' 7"  (1.702 m)   Wt 131 lb 3.2 oz (59.5 kg)   LMP 06/20/2022 (Approximate) Comment: pt states she gets periods occassionally  SpO2 94%   BMI 20.55 kg/m  BP Readings from Last 3 Encounters:  09/24/22 (!) 114/51  09/12/22 118/66  06/18/22 (!) 116/57   Wt Readings from Last 3 Encounters:  09/24/22 131 lb 3.2 oz (59.5 kg)  06/18/22 134 lb 3.2 oz (60.9 kg)  03/19/22 129 lb (58.5 kg)  Physical Exam Constitutional:      Appearance: Normal appearance.  Eyes:     Pupils: Pupils are equal, round, and reactive to light.  Cardiovascular:     Rate and Rhythm: Normal rate and regular rhythm.  Abdominal:     General: Bowel sounds are normal.  Musculoskeletal:        General: Normal range of motion.  Skin:    General: Skin is warm.  Neurological:     General: No focal deficit present.     Mental Status: She is alert. Mental status is at baseline.  Psychiatric:        Mood and Affect: Mood normal.        Behavior: Behavior normal.        Thought Content: Thought content normal.        Judgment: Judgment normal.     Results for orders placed or performed in visit on 09/24/22  Sickle Cell Panel  Result Value Ref Range   Glucose 80 70 - 99 mg/dL   BUN 18 6 - 24 mg/dL   Creatinine, Ser 1.61 (L) 0.57 - 1.00 mg/dL   eGFR 096 >04 VW/UJW/1.19   BUN/Creatinine Ratio 32 (H) 9 - 23   Sodium 139 134 - 144 mmol/L   Potassium 4.3 3.5 - 5.2 mmol/L   Chloride 107 (H) 96 - 106 mmol/L   CO2 16 (L) 20 - 29 mmol/L   Calcium 9.5 8.7 - 10.2 mg/dL   Total Protein 7.3 6.0 - 8.5 g/dL   Albumin 4.4 3.8 - 4.9 g/dL   Globulin, Total 2.9 1.5 - 4.5 g/dL   Albumin/Globulin Ratio 1.5 1.2 - 2.2    Bilirubin Total 2.4 (H) 0.0 - 1.2 mg/dL   Alkaline Phosphatase 77 44 - 121 IU/L   AST 48 (H) 0 - 40 IU/L   ALT 25 0 - 32 IU/L   Ferritin 1,420 (H) 15 - 150 ng/mL   Vit D, 25-Hydroxy 21.4 (L) 30.0 - 100.0 ng/mL   WBC 12.6 (H) 3.4 - 10.8 x10E3/uL   RBC 2.09 (LL) 3.77 - 5.28 x10E6/uL   Hemoglobin 6.1 (LL) 11.1 - 15.9 g/dL   Hematocrit 14.7 (L) 82.9 - 46.6 %   MCV 89 79 - 97 fL   MCH 29.2 26.6 - 33.0 pg   MCHC 32.8 31.5 - 35.7 g/dL   RDW 56.2 (H) 13.0 - 86.5 %   Platelets 613 (H) 150 - 450 x10E3/uL   Neutrophils 62 Not Estab. %   Lymphs 16 Not Estab. %   Monocytes 14 Not Estab. %   Eos 6 Not Estab. %   Basos 1 Not Estab. %   Neutrophils Absolute 7.9 (H) 1.4 - 7.0 x10E3/uL   Lymphocytes Absolute 2.0 0.7 - 3.1 x10E3/uL   Monocytes Absolute 1.7 (H) 0.1 - 0.9 x10E3/uL   EOS (ABSOLUTE) 0.8 (H) 0.0 - 0.4 x10E3/uL   Basophils Absolute 0.2 0.0 - 0.2 x10E3/uL   Immature Granulocytes 1 Not Estab. %   Immature Grans (Abs) 0.1 0.0 - 0.1 x10E3/uL   NRBC 10 (H) 0 - 0 %   Hematology Comments: Note:    Retic Ct Pct 14.5 (H) 0.6 - 2.6 %  [id number] 11+Oxyco+Alc+Crt-Bund  Result Value Ref Range   Ethanol Negative Cutoff=0.020 %   Amphetamines, Urine Negative Cutoff=1000 ng/mL   Barbiturate Negative Cutoff=200 ng/mL   BENZODIAZ UR QL Negative Cutoff=200 ng/mL   Cannabinoid Quant, Ur Negative Cutoff=50 ng/mL   Cocaine (Metabolite) Negative Cutoff=300 ng/mL   OPIATE  SCREEN URINE See Final Results Cutoff=300 ng/mL   Oxycodone/Oxymorphone, Urine See Final Results Cutoff=300 ng/mL   Phencyclidine Negative Cutoff=25 ng/mL   Methadone Screen, Urine Negative Cutoff=300 ng/mL   Propoxyphene Negative Cutoff=300 ng/mL   Meperidine Negative Cutoff=200 ng/mL   Tramadol Negative Cutoff=200 ng/mL   Creatinine 50.4 20.0 - 300.0 mg/dL   pH, Urine 5.7 4.5 - 8.9  Opiates Confirmation, Urine  Result Value Ref Range   Opiates Negative Cutoff=300 ng/mL  Oxycodone/Oxymorphone, Confirm  Result Value Ref Range    OXYCODONE/OXYMORPH Positive (A) Cutoff=300   OXYCODONE Positive (A)    OXYCODONE 1,297 Cutoff=300 ng/mL   OXYMORPHONE Positive (A)    OXYMORPHONE (GC/MS) 2,005 Cutoff=300 ng/mL  Urinalysis Dipstick  Result Value Ref Range   Color, UA yellow    Clarity, UA clear    Glucose, UA Negative Negative   Bilirubin, UA n    Ketones, UA n    Spec Grav, UA 1.010 1.010 - 1.025   Blood, UA n    pH, UA 5.5 5.0 - 8.0   Protein, UA Negative Negative   Urobilinogen, UA 0.2 0.2 or 1.0 E.U./dL   Nitrite, UA n    Leukocytes, UA Negative Negative   Appearance     Odor      Last CBC Lab Results  Component Value Date   WBC 12.6 (H) 09/24/2022   HGB 6.1 (LL) 09/24/2022   HCT 18.6 (L) 09/24/2022   MCV 89 09/24/2022   MCH 29.2 09/24/2022   RDW 21.8 (H) 09/24/2022   PLT 613 (H) 09/24/2022   Last metabolic panel Lab Results  Component Value Date   GLUCOSE 80 09/24/2022   NA 139 09/24/2022   K 4.3 09/24/2022   CL 107 (H) 09/24/2022   CO2 16 (L) 09/24/2022   BUN 18 09/24/2022   CREATININE 0.56 (L) 09/24/2022   EGFR 110 09/24/2022   CALCIUM 9.5 09/24/2022   PROT 7.3 09/24/2022   ALBUMIN 4.4 09/24/2022   LABGLOB 2.9 09/24/2022   AGRATIO 1.5 09/24/2022   BILITOT 2.4 (H) 09/24/2022   ALKPHOS 77 09/24/2022   AST 48 (H) 09/24/2022   ALT 25 09/24/2022   ANIONGAP 8 09/12/2022   Last lipids Lab Results  Component Value Date   CHOL 143 11/25/2014   HDL 27 (L) 11/25/2014   LDLCALC 89 11/25/2014   TRIG 137 11/25/2014   CHOLHDL 5.3 11/25/2014   Last hemoglobin A1c Lab Results  Component Value Date   HGBA1C 4.4 (L) 03/20/2017   Last thyroid functions Lab Results  Component Value Date   TSH 0.099 (L) 03/20/2017   Last vitamin D Lab Results  Component Value Date   VD25OH 21.4 (L) 09/24/2022   Last vitamin B12 and Folate Lab Results  Component Value Date   FOLATE 9.5 03/02/2018      The ASCVD Risk score (Arnett DK, et al., 2019) failed to calculate for the following reasons:    Cannot find a previous HDL lab   Cannot find a previous total cholesterol lab    Assessment & Plan:   Problem List Items Addressed This Visit       Other   Chronic pain syndrome - Primary (Chronic)   Relevant Orders   [id number] 11+Oxyco+Alc+Crt-Bund (Completed)   Hb-SS disease without crisis (HCC)   Relevant Orders   Sickle Cell Panel (Completed)   Urinalysis Dipstick (Completed)   Other Visit Diagnoses     Vitamin D deficiency       Acute cough  Relevant Orders   POCT Influenza A/B   POC COVID-19 BinaxNow   Sore throat       Relevant Orders   POCT Influenza A/B   POC COVID-19 BinaxNow     1. Chronic pain syndrome Reviewed PDMP substance reporting system prior to prescribing opiate medications. No inconsistencies noted.   - [id number] 11+Oxyco+Alc+Crt-Bund  2. Hb-SS disease without crisis (HCC) Folic acid 1 mg daily.  Will follow-up by phone with hemoglobin results.  Goal is to maintain patient's hemoglobin above 8. - Sickle Cell Panel - Urinalysis Dipstick  3. Vitamin D deficiency Will review labs as results become available.  4. Acute cough  - POCT Influenza A/B - POC COVID-19 BinaxNow  5. Sore throat Recommend over-the-counter Tylenol as directed.  Recommend symptom management.  Increase fluid intake. - POCT Influenza A/B - POC COVID-19 BinaxNow   Return in about 3 months (around 12/25/2022).    Nolon Nations  APRN, MSN, FNP-C Patient Care [facility] [address] [place], [place] [address] [phone]

## 2022-09-24 NOTE — Patient Instructions (Signed)
Living With Sickle Cell Disease Living with a long-term condition, such as sickle cell disease, can be a challenge. It can affect both your physical and mental health. You may not have total control over your condition. But proper care and treatment can help manage the effects of the disease so you can feel good and lead an active life. You can take steps to manage your condition and stay as healthy as possible. How does sickle cell disease affect me? Sickle cell disease can cause challenges that affect your quality of life. You may get sick more often as a result of organ damage and infections. Sometimes you may need to stay in the hospital. Learn how to recognize that you are not feeling well and that you may be getting sick. What actions can I take to manage my condition?  The goals of treatment are to control your symptoms and prevent and treat problems. Work with your health care provider to create a treatment plan that works for you. Taking an active role in managing your condition can help you feel more in control of your situation. Ask about possible side effects of medicines that your health care provider recommends. Discuss how you feel about having those side effects. Keeping a healthy lifestyle can help you manage your condition. This includes eating a healthy diet, getting enough sleep, and getting regular exercise. Sickle cell disease may affect your ability to take care of your basic needs. Tell your health care provider if you have concerns about any of these needs: Access to food. Housing. Safe drinking water and other utilities. Safety in your home and community. Work or school. Transportation. Paying for health care. Your health care provider may be able to connect you with community resources that can help you. How to manage stress  Living with sickle cell disease can be stressful. This disease can have a big impact on your mental health. Talk with your health care provider  about ways to reduce your stress or if you have concerns about your mental health.  To cope with stress, try: Keeping a stress diary. This can help you learn what causes your stress to start (figure out your triggers) and how to control your response to those triggers. Spending time doing things that you enjoy, such as: Hobbies. Being outdoors. Spending time with friends and people who make you laugh. Doing yoga, muscle relaxation, deep breathing, or mindfulness practices. Expressing yourself through journal writing, art, crafting, poetry, or playing music. Staying positive about your health. Try to accept that you cannot control your condition perfectly. Follow these instructions at home: Medicines Take over-the-counter and prescription medicines only as told by your health care provider. If you were prescribed antibiotics, take them as told by your health care provider. Do not stop taking them even if you start to feel better. If you develop a fever, do not take medicines to reduce the fever right away. This could cover up another problem. Contact your health care provider. Eating and drinking Drink enough fluid to keep your urine pale yellow. Drink more in hot weather and during exercise. Limit or avoid drinking alcohol. Eat a balanced and nutritious diet. Eat plenty of fruits, vegetables, whole grains, and lean protein. Take vitamins and supplements as told by your health care provider. Traveling When traveling, keep these with you: Your medical information. The names of your health care providers. Your medicines. If you have to travel by air, ask about precautions you should take. Managing pain Work with   your health care provider to create a pain management plan that works for you. The plan may include: Ways to reduce or manage your pain at home, such as: Using a heating pad. Taking a warm bath. Using healthy ways to distract you from the pain, such as hobbies or  reading. Practicing ways to relax, such as doing yoga or listening to music. Getting massages. Doing exercises or stretches as told by a physical therapist. Tracking how pain affects your daily life functions. When to seek help. Who to contact and what to do in case of a pain emergency. General instructions Do not use any products that contain nicotine or tobacco. These products include cigarettes, chewing tobacco, and vaping devices, such as e-cigarettes. These lower blood oxygen levels. If you need help quitting, ask your health care provider. Consider wearing a medical alert bracelet. Use an app or journal to track your symptoms, assess your level of pain and fatigue, and keep track of your medicines. Avoid the following: High altitudes. Very high or low temperatures and big changes in temperature. Activities that will lower your oxygen levels, such as mountain climbing or doing exercise that takes a lot of effort. Stay up to date on: Your treatment plan. Learn as much as you can about your condition. Health screenings. This will help prevent problems or catch them early on. Vaccines. This will help prevent infection. Wash your hands often with soap and water to help prevent infections. Wash them for at least 20 seconds each time. Keep all follow-up visits. Regular follow-up with your health care provider can help you better manage your condition. Where to find support You can find help and support through: Talking with a therapist or taking part in support groups. Sickle Cell Disease Foundation of [place]: www.sicklecelldisease.org Where to find more information Centers for Disease Control and Prevention: www.cdc.gov American Society of Hematology: www.hematology.org Contact a health care provider if: Your symptoms get worse. You have new symptoms. You have a fever. Get help right away if: You have a painful erection of the penis that lasts a long time (priapism). You become  short of breath or are having trouble breathing. You have pain that cannot be controlled with medicine. You have any signs of a stroke. "BE FAST" is an easy way to remember the main warning signs: B - Balance. Dizziness, sudden trouble walking, or loss of balance. E - Eyes. Trouble seeing or a change in how you see. F - Face. Sudden weakness or loss of feeling of the face. The face or eyelid may droop on one side. A - Arms. Weakness or loss of feeling in an arm. This happens all of a sudden and most often on one side of the body. S - Speech. Sudden trouble speaking, slurred speech, or trouble understanding what people say. T - Time. Time to call emergency services. Write down what time symptoms started. You have other signs of a stroke, such as: A sudden, very bad headache with no known cause. Feeling like you may vomit (nausea). Vomiting. Seizure. These symptoms may be an emergency. Get help right away. Call 911. Do not wait to see if the symptoms will go away. Do not drive yourself to the hospital. Also, get help right away if: You have strong feelings of sadness or loss of hope, or you have thoughts about hurting yourself or others. Take one of these steps if you feel like you may hurt yourself or others, or have thoughts about taking your own life:   Go to your nearest emergency room. Call 911. Call the National Suicide Prevention Lifeline at 1-800-273-8255 or 988. This is open 24 hours a day. Text the Crisis Text Line at 741741. Summary Proper care and treatment can help manage the effects of sickle cell disease so you can feel good and lead an active life. The goals of treatment are to control your symptoms and prevent and treat problems. Taking an active role in managing your condition can help you feel more in control of your situation. Work with your health care provider to create a pain management plan that works for you. Get medical help right away as told by your health care  provider. This information is not intended to replace advice given to you by your health care provider. Make sure you discuss any questions you have with your health care provider. Document Revised: 08/13/2021 Document Reviewed: 08/13/2021 Elsevier Patient Education  2023 Elsevier Inc.  

## 2022-09-25 LAB — CMP14+CBC/D/PLT+FER+RETIC+V...
AST: 48 IU/L — ABNORMAL HIGH (ref 0–40)
Albumin/Globulin Ratio: 1.5 (ref 1.2–2.2)
Calcium: 9.5 mg/dL (ref 8.7–10.2)
Ferritin: 1420 ng/mL — ABNORMAL HIGH (ref 15–150)
Globulin, Total: 2.9 g/dL (ref 1.5–4.5)
Total Protein: 7.3 g/dL (ref 6.0–8.5)

## 2022-09-25 LAB — DRUG SCREEN 764883 11+OXYCO+ALC+CRT-BUND

## 2022-09-26 LAB — CMP14+CBC/D/PLT+FER+RETIC+V...
ALT: 25 IU/L (ref 0–32)
Albumin: 4.4 g/dL (ref 3.8–4.9)
Alkaline Phosphatase: 77 IU/L (ref 44–121)
BUN/Creatinine Ratio: 32 — ABNORMAL HIGH (ref 9–23)
BUN: 18 mg/dL (ref 6–24)
Basophils Absolute: 0.2 10*3/uL (ref 0.0–0.2)
Bilirubin Total: 2.4 mg/dL — ABNORMAL HIGH (ref 0.0–1.2)
CO2: 16 mmol/L — ABNORMAL LOW (ref 20–29)
Chloride: 107 mmol/L — ABNORMAL HIGH (ref 96–106)
Creatinine, Ser: 0.56 mg/dL — ABNORMAL LOW (ref 0.57–1.00)
EOS (ABSOLUTE): 0.8 10*3/uL — ABNORMAL HIGH (ref 0.0–0.4)
Eos: 6 %
Glucose: 80 mg/dL (ref 70–99)
Immature Grans (Abs): 0.1 10*3/uL (ref 0.0–0.1)
Lymphs: 16 %
MCH: 29.2 pg (ref 26.6–33.0)
MCHC: 32.8 g/dL (ref 31.5–35.7)
Monocytes Absolute: 1.7 10*3/uL — ABNORMAL HIGH (ref 0.1–0.9)
Monocytes: 14 %
NRBC: 10 % — ABNORMAL HIGH (ref 0–0)
Neutrophils: 62 %
Potassium: 4.3 mmol/L (ref 3.5–5.2)
RBC: 2.09 x10E6/uL — CL (ref 3.77–5.28)
Sodium: 139 mmol/L (ref 134–144)
Vit D, 25-Hydroxy: 21.4 ng/mL — ABNORMAL LOW (ref 30.0–100.0)
WBC: 12.6 10*3/uL — ABNORMAL HIGH (ref 3.4–10.8)
eGFR: 110 mL/min/{1.73_m2} (ref >59)

## 2022-09-27 ENCOUNTER — Encounter (HOSPITAL_BASED_OUTPATIENT_CLINIC_OR_DEPARTMENT_OTHER): Payer: Medicaid Other | Attending: General Surgery | Admitting: General Surgery

## 2022-09-27 ENCOUNTER — Other Ambulatory Visit: Payer: Self-pay | Admitting: Family Medicine

## 2022-09-27 ENCOUNTER — Telehealth: Payer: Self-pay | Admitting: Family Medicine

## 2022-09-27 DIAGNOSIS — L97822 Non-pressure chronic ulcer of other part of left lower leg with fat layer exposed: Secondary | ICD-10-CM | POA: Diagnosis not present

## 2022-09-27 DIAGNOSIS — G894 Chronic pain syndrome: Secondary | ICD-10-CM

## 2022-09-27 DIAGNOSIS — L97818 Non-pressure chronic ulcer of other part of right lower leg with other specified severity: Secondary | ICD-10-CM | POA: Diagnosis present

## 2022-09-27 DIAGNOSIS — D571 Sickle-cell disease without crisis: Secondary | ICD-10-CM

## 2022-09-27 DIAGNOSIS — E559 Vitamin D deficiency, unspecified: Secondary | ICD-10-CM

## 2022-09-27 MED ORDER — IBUPROFEN 600 MG PO TABS
600.0000 mg | ORAL_TABLET | Freq: Three times a day (TID) | ORAL | 5 refills | Status: DC | PRN
Start: 2022-09-27 — End: 2022-12-24

## 2022-09-27 MED ORDER — OXBRYTA 500 MG PO TABS
1000.0000 mg | ORAL_TABLET | Freq: Every day | ORAL | 1 refills | Status: DC
Start: 2022-09-27 — End: 2022-12-17

## 2022-09-27 MED ORDER — OXYCODONE HCL 10 MG PO TABS
10.0000 mg | ORAL_TABLET | ORAL | 0 refills | Status: DC
Start: 2022-09-27 — End: 2022-10-21

## 2022-09-27 MED ORDER — VITAMIN D (ERGOCALCIFEROL) 1.25 MG (50000 UNIT) PO CAPS
50000.0000 [IU] | ORAL_CAPSULE | ORAL | 2 refills | Status: DC
Start: 2022-09-27 — End: 2023-10-16

## 2022-09-27 NOTE — Progress Notes (Signed)
Reviewed PDMP substance reporting system prior to prescribing opiate medications. No inconsistencies noted.  Meds ordered this encounter  Medications   Oxycodone HCl 10 MG TABS    Sig: Take 1 tablet (10 mg total) by mouth every 4 (four) hours while awake.    Dispense:  90 tablet    Refill:  0    Order Specific Question:   Supervising Provider    Answer:   Quentin Angst [[id number]]   ibuprofen (ADVIL) 600 MG tablet    Sig: Take 1 tablet (600 mg total) by mouth every 8 (eight) hours as needed.    Dispense:  30 tablet    Refill:  5    Order Specific Question:   Supervising Provider    Answer:   Quentin Angst [[id number]]   Nolon Nations  APRN, MSN, FNP-C Patient Care [facility] [address] [place], [place] [address] [phone]

## 2022-09-27 NOTE — Progress Notes (Signed)
Patient's hemoglobin is 6.1 g/dL, which is below her baseline.  Please schedule lab/infusion appointment in the day hospital on Monday for 1 unit PRBCs.  Vitamin D continues to be decreased, will continue weekly Drisdol at 50,000 IUs. Folic acid 1 mg daily.  Will resend [place] to specialty pharmacy, [facility] [place], [place].  Oxbryta 1000 mg daily.  Nolon Nations  APRN, MSN, FNP-C Patient Care [facility] [address] [place], [place] [address] [phone]

## 2022-09-27 NOTE — Progress Notes (Signed)
Meds ordered this encounter  Medications   Vitamin D, Ergocalciferol, (DRISDOL) 1.25 MG (50000 UNIT) CAPS capsule    Sig: Take 1 capsule (50,000 Units total) by mouth every 7 (seven) days.    Dispense:  5 capsule    Refill:  2    Order Specific Question:   Supervising Provider    Answer:   Quentin Angst [id number]   voxelotor (OXBRYTA) 500 MG TABS tablet    Sig: Take 2 tablets (1,000 mg) by mouth daily.    Dispense:  90 tablet    Refill:  1    Order Specific Question:   Supervising Provider    Answer:   Quentin Angst [[id number]]   Nolon Nations  APRN, MSN, FNP-C Patient Care [facility] [address] [place], [place] [address] [phone]

## 2022-09-27 NOTE — Progress Notes (Signed)
[name] ([id number]) [id number] Page 1 of 19 Visit Report for 09/27/2022 Chief Complaint Document Details Patient Name: Date of Service: [name] 09/27/2022 9:00 A M Medical Record Number: [id number] Patient Account Number: [id number] Date of Birth/Sex: Treating RN: 1971/05/07 (52 y.o. F) Primary Care Provider: Julianne Handler Other Clinician: Referring Provider: Treating Provider/Extender: Koleen Nimrod Weeks in Treatment: 33 Information Obtained from: Patient Chief Complaint the patient is here for evaluation of her bilateral lower extremity sickle cell ulcers 04/17/2021; patient comes in for substantial wounds on the right and left lower leg Electronic Signature(s) Signed: 09/27/2022 10:14:03 AM By: Duanne Guess MD FACS Entered By: Duanne Guess on 09/27/2022 10:14:02 -------------------------------------------------------------------------------- Debridement Details Patient Name: Date of Service: [name], [name] 09/27/2022 9:00 A M Medical Record Number: [id number] Patient Account Number: [id number] Date of Birth/Sex: Treating RN: 01-15-1971 (52 y.o. [name] Primary Care Provider: Julianne Handler Other Clinician: Referring Provider: Treating Provider/Extender: Koleen Nimrod Weeks in Treatment: 50 Debridement Performed for Assessment: Wound #24 Left,Lateral Ankle Performed By: Physician Duanne Guess, MD Debridement Type: Debridement Level of Consciousness (Pre-procedure): Awake and Alert Pre-procedure Verification/Time Out Yes - 10:00 Taken: Start Time: 10:01 Pain Control: Lidocaine 4% T opical Solution Percent of Wound Bed Debrided: 100% T Area Debrided (cm): otal 0.03 Tissue and other material debrided: Non-Viable, Slough, Slough Level: Non-Viable Tissue Debridement Description: Selective/Open Wound Instrument: Curette Bleeding: Minimum Hemostasis Achieved:  Pressure End Time: 10:02 Procedural Pain: 0 Post Procedural Pain: 3 Response to Treatment: Procedure was tolerated well Level of Consciousness (Post- Awake and Alert procedure): Post Debridement Measurements of Total Wound Length: (cm) 0.2 Width: (cm) 0.2 Depth: (cm) 0.1 Volume: (cm) 0.003 Character of Wound/Ulcer Post Debridement: Improved Post Procedure Diagnosis Same as Pre-procedure Notes Scribed for Dr Lady Gary by Brenton Grills RN. Electronic Signature(s) [place], Felecia Shelling ([id number]) [id number] Page 2 of 19 Signed: 09/27/2022 10:34:50 AM By: Duanne Guess MD FACS Entered By: Brenton Grills on 09/27/2022 10:02:47 -------------------------------------------------------------------------------- Debridement Details Patient Name: Date of Service: [name], [name] 09/27/2022 9:00 A M Medical Record Number: [id number] Patient Account Number: [id number] Date of Birth/Sex: Treating RN: [birth year] (52 y.o. [name] Primary Care Provider: Julianne Handler Other Clinician: Referring Provider: Treating Provider/Extender: Koleen Nimrod Weeks in Treatment: 40 Debridement Performed for Assessment: Wound #21 Right,Medial Ankle Performed By: Physician Duanne Guess, MD Debridement Type: Debridement Severity of Tissue Pre Debridement: Fat layer exposed Level of Consciousness (Pre-procedure): Awake and Alert Pre-procedure Verification/Time Out Yes - 10:00 Taken: Start Time: 10:01 Pain Control: Lidocaine 4% T opical Solution Percent of Wound Bed Debrided: 100% T Area Debrided (cm): otal 21.78 Tissue and other material debrided: Non-Viable, Slough, Slough Level: Non-Viable Tissue Debridement Description: Selective/Open Wound Instrument: Curette Bleeding: Minimum Hemostasis Achieved: Pressure End Time: 10:02 Procedural Pain: 0 Post Procedural Pain: 3 Response to Treatment: Procedure was tolerated well Level of  Consciousness (Post- Awake and Alert procedure): Post Debridement Measurements of Total Wound Length: (cm) 7.5 Width: (cm) 3.7 Depth: (cm) 0.1 Volume: (cm) 2.179 Character of Wound/Ulcer Post Debridement: Improved Severity of Tissue Post Debridement: Fat layer exposed Post Procedure Diagnosis Same as Pre-procedure Notes Scribed for Dr Lady Gary by Brenton Grills RN. Electronic Signature(s) Signed: 09/27/2022 10:34:50 AM By: Duanne Guess MD FACS Entered By: Brenton Grills on 09/27/2022 10:04:12 -------------------------------------------------------------------------------- Debridement Details Patient Name: Date of Service: [name], [name] 09/27/2022 9:00 A M Medical Record Number: [id number] Patient Account Number: [id number] Date of Birth/Sex: Treating RN: [birth year] (52  y.o. [name] Primary Care Provider: Julianne Handler Other Clinician: Referring Provider: Treating Provider/Extender: Koleen Nimrod Weeks in Treatment: 81 Debridement Performed for Assessment: Wound #17 Right,Lateral Lower Leg Performed By: Physician Duanne Guess, MD Debridement Type: Debridement Level of Consciousness (Pre-procedure): Awake and Alert Pre-procedure Verification/Time Out Yes - 10:00 Taken: Start Time: 10:01 Pain Control: Lidocaine 4% Topical Solution [name] ([id number]) [id number] Page 3 of 19 Percent of Wound Bed Debrided: 100% T Area Debrided (cm): otal 3.44 Tissue and other material debrided: Non-Viable, Slough, Slough Level: Non-Viable Tissue Debridement Description: Selective/Open Wound Instrument: Curette Bleeding: Minimum Hemostasis Achieved: Pressure End Time: 10:02 Procedural Pain: 0 Post Procedural Pain: 3 Response to Treatment: Procedure was tolerated well Level of Consciousness (Post- Awake and Alert procedure): Post Debridement Measurements of Total Wound Length: (cm) 7.3 Width: (cm) 0.6 Depth:  (cm) 0.1 Volume: (cm) 0.344 Character of Wound/Ulcer Post Debridement: Improved Post Procedure Diagnosis Same as Pre-procedure Notes Scribed for Dr Lady Gary by Brenton Grills RN. Electronic Signature(s) Signed: 09/27/2022 10:34:50 AM By: Duanne Guess MD FACS Entered By: Brenton Grills on 09/27/2022 10:05:14 -------------------------------------------------------------------------------- HPI Details Patient Name: Date of Service: [name], [name] 09/27/2022 9:00 A M Medical Record Number: [id number] Patient Account Number: [id number] Date of Birth/Sex: Treating RN: 03/21/1971 (52 y.o. F) Primary Care Provider: Julianne Handler Other Clinician: Referring Provider: Treating Provider/Extender: Koleen Nimrod Weeks in Treatment: 24 History of Present Illness Location: medial and lateral ankle region on the right and left medial malleolus Quality: Patient reports experiencing a shooting pain to affected area(s). Severity: Patient states wound(s) are getting worse. Duration: right lower extremity bimalleolar ulcers have been present for approximately 2 years; the rright meedial malleolus ulcer has been there proximally 6 months Timing: Pain in wound is constant (hurts all the time) Context: The wound would happen gradually ssociated Signs and Symptoms: Patient reports having increase discharge. A HPI Description: 52 year old patient with a history of sickle cell anemia who was last seen by me with ulceration of the right lower extremity above the ankle and was referred to Dr. Marzetta Board for a surgical debridement as I was unable to do anything in the office due to excruciating pain. At that stage she was referred from the plastic surgery service to dermatology who treated her for a skin infection with doxycycline and then Levaquin and a local antibiotic ointment. I understand the patient has since developed ulceration on the left ankle both medial and lateral and was now  referred back to the wound center as dermatology has finished the management. I do not have any notes from the dermatology department Old notes: 52 year old patient with a history of sickle cell anemia, pain bilateral lower extremities, right lower extremity ulcer and has a history of receiving a skin graft( Theraskin) several months ago. She has been visiting the wound center [facility] and was seen by Dr. Leanord Hawking and Dr. Marzetta Board. after prolonged conservator therapy between July 2016 and January 2017. She had been seen by the plastic surgeon and taken to the OR for debridement and application of Theraskin. She had 3 applications of Theraskin and was then treated with collagen. Prior to that she had a history of similar problems in 2014 and was treated conservatively. Had a reflux study done for the right lower extremity in August 2016 without reflux or DVT . Past medical history significant for sickle cell disease, anemia, leg ulcers, cholelithiasis,and has never been a smoker. Once the patient was discharged on the wound center  she says within 2 or 3 weeks the problems recurred and she has been treating it conservatively. since I saw her 3 weeks ago at [facility] she has been unable to get her dressing material but has completed a course of doxycycline. 6/7/ 2017 -- lower extremity venous duplex reflux evaluation was done No evidence of SVT or DVT in the RLL. No venous incompetence in the RLL. No further vascular workup is indicated at this time. She was seen by Dr. Mina Marble, on 10/04/2015. She agreed with the plan of taking her to the OR for debridement and application of theraskin and would also take biopsies to rule out pyoderma gangrenosum. Follow-up note dated May 31 received and she was status post application of Theraskin to multiple ulcers around the right ankle. Pathology did not show evidence of malignancy or pyoderma gangrenosum. She would continue to see as in the wound clinic  for further care and see Dr. Marzetta Board as needed. The patient brought the biopsy report and it was consistent with stasis ulcer no evidence of malignancy and the comment was that there was some adjacent neovascularization, fibrosis and patchy perivascular chronic inflammation. ZYKIRAH, ARNT ([id number]) [id number] Page 4 of 19 11/15/2015 -- today we applied her first application of Theraskin 11/30/15; TheraSkin #2 12/13/2015 -- she is having a lot of pain locally and is here for possible application of a theraskin today. 01/16/2016 -- the patient has significant pain and has noticed despite in spite of all local care and oral pain medication. It is impossible to debride her in the office. 02/06/2016 -- I do not see any notes from Dr. Leta Baptist( the patient has not made a call to the office know as she heard from them) and the only visit to recently was with her PCP Dr. Gypsy Decant -- I saw her on 01/16/2016 and prescribed 90 tablets of oxycodone 10 mg and did lab work and screening for HIV. the HIV was negative and hemoglobin was 6.3 with a WBC count of 14.9 and hematocrit of 17.8 with platelets of 561. 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 The bimalleolar ulcers to the right lower extremity have been present for approximately 2 years, the left medial malleolus ulcer has been present approximately 6 months. She has followed with Dr.Thimmappa in the past and has had a total of 3 applications of Theraskin (01/2015, 09/2015, 06/17/16). She has also followed with Dr. Meyer Russel here in the clinic and has received 2 applications of TheraSkin (11/10/15, 11/30/15). The patient does experience chronic, and is not amenable to debridement. 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 She is not currently on any antibiotic therapy  and has not been treated with any recently. 07/17/2016 -- was seen by Dr. Leta Baptist of plastic surgery who saw her 2 weeks postop application of Theraskin #3. She had removed her dressing and asked her to apply silver alginate on alternate days and follow-up back with the wound center. Future debridements and application of skin substitute would have to be done in the hospital due to her high risk for anesthesia. READMISSION 04/17/2021 Patient is now a 52 year old woman that we have had in this clinic for a prolonged period of time and 2016-2017 and then again for 2 visits in February 2018. At that point she had wounds on the right lower leg predominantly medial. She had also been seen  by plastic surgery Dr. Marzetta Board who I believe took her to the OR for operative debridement and application of TheraSkin in 2017. After she left our clinic she was followed for a very prolonged period of time in the wound care center in [facility] who then referred her ultimately to [facility] where she was seen by Dr. Mardene Speak. Again taken her to the OR for skin grafting which apparently did not take. She had multiple other attempts at dressings although I have not really looked over all of these notes in great detail. She has not been seen in a wound care center in about a year. She states over the last year in addition to her right lower leg she has developed wounds on the left lower leg quite extensive. She is using Xeroform to all of these wounds without really any improvement. She also has Medicaid which does not cover wound products. The patient has had vascular work-ups in the past including most recently on 03/28/2021 showing biphasic waveforms on the right triphasic at the PTA and biphasic at the dorsalis pedis on the left. She was unable to tolerate any degree of compression to do ABIs. Unfortunately TBI's were also not done. She had venous reflux studies done in 2017. This did not show any evidence of a DVT or SVT  and no venous incompetence was noted in the right leg at the time this was the only side with the wound As noted I did not look all over her old records. She apparently had a course of HBO and Baptist although I am not sure what the indication would have been. In any case she developed seizures and terminated treatment earlier. She is generally much more disabled than when we last saw her in clinic. She can no longer walk pretty much wheelchair-bound because predominantly of pain in the left hip. 04/24/2021; the patient tolerated the wraps we put on. We used Santyl and Hydrofera Blue under compression. I brought her back for a nurse visit for a change in dressing. With Medicaid we will have a hard time getting anything paid for and hence the need for compression. She arrives in clinic with all the wounds looking somewhat better in terms of surface 12/20; circumferential wound on the right from the lateral to the medial. She has open areas on the left medial and left lateral x2 on all of this with the same surface. This does not look completely healthy although she does have some epithelialization. She is not complaining of a lot of pain which is unusual for her sickle ulcers. I have not looked over her extensive records from [facility]. She had recent arterial studies and has a history of venous reflux studies I will need to look these over although I do not believe she has significant arterial disease 2023 05/22/2021; patient's wound areas measure slightly smaller. Still a lot of drainage coming from the right we have been using Hydrofera Blue and Santyl with some improvement in the wound surfaces. She tells me she will be getting transfused later in the week for her underlying sickle cell anemia I have looked over her recent arterial studies which were done in the fall. This was in November and showed biphasic and triphasic waveforms but she could not tolerate ABIs because of pressure and unfortunately  TBI's were not done. She has not had recent venous reflux studies that I can see 1/10; not much change about the same surface area. This has a yellowish surface to it very  gritty. We have been using Santyl and Hydrofera Blue for a prolonged period. Culture I did last week showed methicillin sensitive staph aureus "rare". Our intake nurse reports greenish drainage which may be the Hydrofera Blue itself 1/17; wounds are continue to measure smaller although I am not sure about the accuracy here. Especially the areas on the right are covered in what looks to be a nonviable surface although she does have some epithelialization. Similarly she has areas on the left medial and left lateral ankle area which appear to have a better surface and perhaps are slightly smaller. We have been using Santyl and Hydrofera Blue. She cannot tolerate mechanical debridements She went for her reflux studies which showed significant reflux at the greater saphenous vein at the saphenofemoral junction as well as the greater saphenous vein in the proximal calf on the left she had reflux in the thigh and the common femoral vein and supra vein Fishel vein reflux in the greater saphenous vein. I will have vein and vascular look at this. My thoughts have been that these are likely sickle wounds. I looked through her old records from [facility] wound care center and then when she graduated to [facility] wound care center where she saw Dr. Ronda Fairly and Dr. Mardene Speak. Although I can see she had reflux studies done I do not see that she actually saw a vein and vascular. I went over the fact that she had operative debridements and actual skin grafting that did not take. I do not think these wounds have ever really progressed towards healing 1/31;Substantial wounds on the right ankle area. Hyper granulated very gritty adherent debris on the surface. 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 On the left she has no DVT or SVT deep vein reflux in the common femoral vein. Superficial vein reflux in the greater saphenous vein on although the vein diameter is not really all that large. I do not think there is anything that can be done with these although I am going to send her for consultation to vein and vascular. 2/7; Wound exam; substantial wound area on the right posterior ankle area and areas on the left medial ankle and left lateral ankle. I was able to debride the left medial ankle last week fairly aggressively and it is back this week to a completely nonviable surface She will see vascular surgery this Friday and I would like them to review the venous studies and also any comments on her arterial status. If they do not see an issue here I am going to refer her to plastic surgery for an operative debridement perhaps intraoperative ACell or Integra. Eventually she will require a deep tissue culture again 2/14; substantial wound area on the right posterior ankle, medial ankle. We have been using silver alginate ARLAINE, MORMANDO ([id number]) [id number] Page 5 of 19 The patient was seen by vein and vascular she had both venous reflux studies and arterial studies. 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 She had no evidence of superficial venous thrombosis.  She did have superficial vein reflux at the saphenofemoral junction and the greater saphenous vein. On the left no evidence of a DVT no evidence of superficial venous throat thrombosis she did have deep vein reflux in the  common femoral vein and superficial vein reflux in the greater saphenous vein but these were not felt to be amenable to ablation. In terms of arterial studies she had triphasic and wife biphasic wave waveforms bilaterally not felt to have a significant arterial issue. I do not get the feeling that they felt that any part of her nonhealing wounds were related to either arterial or venous issues. They did note that she had venous reflux at the right at the [facility] and [facility]. And also on the left there were reflux in the deep system at the common femoral vein and greater saphenous vein in the proximal thigh. Nothing amenable to ablation. 2/20; she is making some decent progress on the right where there is nice skin between the 2 open areas on the right ankle. The surfaces here do not look viable yet there is some surrounding epithelialization. She still has a small area on the left medial ankle area. Hyper-granulated Jody's away always 2/28 patient has an appointment with plastic surgery on 3/8. We will see her back on 3/9. She may have to call us to get the area redressed. We've been using Santyl under silver alginate. We made a nice improvement on the left medial ankle. The larger wounds on the right also looks somewhat better in terms of epithelialization although I think they could benefit from an aggressive debridement if plastic surgery would be willing to do that. Perhaps placement of Integra or a cell 07/26/2021: She saw Dr. Arita Miss yesterday. He raised the question as to whether or not this might be pyoderma and wanted to wait until that question was answered by dermatology before proceeding with any sort of operative debridement. We have continue to use Santyl under silver alginate with Kerlix and Coban wraps. Overall, her wounds appear to be continuing to contract and epithelialize, with some granulation tissue present. There continues to be some slough on all wound surfaces. 08/09/2021: She has not been  able to get an appointment with dermatology because apparently the offices in [facility] and [place] do not accept Medicaid. She is looking into whether or not she can be seen at the main [facility] dermatology clinic. This is necessary because plastic surgery is concerned that her wounds might represent pyoderma and they did not want to do any procedure until that was clarified. We have been using Santyl under silver alginate with Kerlix and Coban wraps. Today, there was a greater amount of drainage on her dressings with a slight green discoloration and significant odor. Despite this, her wounds continue to contract and epithelialize. There is pale granulation tissue present and actually, on the left medial ankle, the granulation tissue is a bit hypertrophic. 08/16/2021: Last week, I took a culture and this grew back rare methicillin-resistant Staph aureus and rare corynebacterium. The MRSA was sensitive to gentamicin which we began applying topically on an empiric basis. This week, her wounds are a bit smaller and the drainage and odor are less. Her primary care provider is working on assisting the patient with a dermatology evaluation. She has been in silver alginate over the gentamicin that was started last week along with Kerlix and Coban wraps. 08/23/2021: Because she has Medicaid, we have been unable to get her into see any dermatologist in  the [place] to rule out pyoderma gangrenosum, which was a requirement from plastic surgery prior to any sort of debridement and grafting. Despite this, however, all of her wounds continue to get smaller. The wound on her left medial ankle is nearly closed. There is no odor from the wounds, although she still accumulates a modest amount of drainage on her dressings. 08/30/2021: The lateral right ankle wound and the medial left ankle wound are a bit smaller today. The medial right ankle wound is about the same size. They are less tender. We have still  been unable to get her into dermatology. 09/06/2021: All of the wounds are about the same size today. She continues to endorse minimal pain. I communicated with Dr. Arita Miss in plastic surgery regarding our issues getting a dermatology appointment; he was out of town but indicated that he would look into perhaps performing the biopsy in his office and will have his office contact her. 09/14/2021: The patient has an appointment in dermatology, but it is not until October. Her wounds are roughly the same; she continues to have very thick purulent-looking drainage on her dressings. 09/20/2021: The left medial wound is nearly closed and just has a bit of accumulated eschar on the surface. The right medial and lateral ankle wounds are perhaps a little bit smaller. They continue to have a very pale surface with accumulation of thin slough. 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 I did not think Jodie Echevaria was indicated based on this. She is getting topical mupirocin with Prisma silver collagen. 10/04/2021: The patient was not seen in clinic last week due to childcare coverage issues. In the interim, the left medial leg wound has closed. The right sided leg wounds are smaller. There is more granulation tissue coming through, particularly on the lateral wound. The surface remains somewhat gritty. We have been applying topical mupirocin and Prisma silver collagen. 10/11/2021: The left medial leg wound remains closed. She does complain of some anesthetic sensation to the area. Both of the right-sided leg wounds are smaller but still have accumulated slough. 10/18/2021: Both right-sided leg wounds are minimally smaller this week. She still continues to accumulate slough and has thick drainage on her dressings. 10/23/2021: Both wounds continue to contract. There is still slough buildup. She has been approved for a keratin-based skin substitute trial product but it will not be available until next  week. 10/30/2021: The wounds are about the same to perhaps slightly smaller. There is still continued slough buildup. Unfortunately, the rep for the keratin based product did not show up today and did not answer his phone when called. 11/08/2021: The wounds are little bit smaller today. She continues to have thick drainage but the surfaces are relatively clean with just a little bit of slough accumulation. She reported to me today that she is unable to completely flex her left ankle and on examination it seems this is potentially related to scar tissue from her wounds. We do have the ProgenaMatrix trial product available for her today. 11/15/2021: Both wounds are smaller today. There is some slough accumulation on the surfaces, but the medial wound, in particular looks like it is filling in and is less deep. She did hear from physical therapy and she is going to start working with them on July 11. She is here for her second application of the trial skin substitute, ProgenaMatrix. 11/22/2021: Both wounds continue to contract, the medial more dramatically than the lateral. Both wounds have a layer  of slough on the surface, but underneath this, the gritty fibrous tissue has a little bit more of a pink cast to it rather than being as pale as it has been. 11/29/2021: The wounds are roughly the same size this week, perhaps a millimeter or 2 smaller. The medial wound has filled in and is nearly flush with the surrounding skin surface. She continues to have a lot of slough accumulation on both surfaces. 12/06/2021: No significant change to her wounds, but she has a new opening on her dorsal foot, just distal to the right lateral ankle wound. The area on her left medial ankle that reopened looks a little bit larger today. She has quite a bit of pain associated with the new wound. 12/12/2021: Her wounds look about the same but the new opening on her right lateral dorsal foot is a little bit bigger. She continues to  have a fair amount of pain with this wound. 12/26/2021: The left medial ankle wound is tiny and superficial. She has 2 areas of crusting on her left lateral ankle, however, that appear to be threatening to open again. Her right medial ankle wound is a little bit smaller today but still continues to accumulate thick rubbery slough. The new dorsal foot wound is exquisitely painful but there is no odor or purulent drainage. No erythema or induration. The right lateral ankle wound looks about the same today, again with thick rubbery slough. [name], [name] ([id number]) [id number] Page 6 of 19 01/03/2022: The left medial ankle wound has closed again. Both right ankle wounds appear to be about the same size with thick rubbery slough. The dorsal foot wound on the right continues to be quite painful and she stated that she did not want any debridement of that site today. 01/10/2022: No real change to any of her wounds. She continues to accumulate thick slough. The dorsal foot wound has merged with the lateral malleolar wound. She is experiencing significant pain in the dorsal foot portion of the ulcer. 01/16/2022: Absolutely no change or progress in her wounds. 01/24/2022: Her wounds are unchanged. She continues to build up slough and the wounds on her dorsal right foot are still exquisitely tender. 01/31/2022: The wounds actually measure a little bit narrower today. They still have thick slough on the surface but the underlying tissue seems a little less fibrotic. We changed to Iodosorb last week. 02/07/2022: Wounds continue to slowly epithelialize around the parameters. She has less pain today. She still accumulates a fairly substantial layer of slough. 02/15/2022: No change to the wounds overall. The measured a little bit larger today per the intake nurse. She continues to have substantial slough accumulation and her pain is a little bit worse. 02/21/2022. No change at all to her  wounds. I do not see that plastic surgery has received her referral yet. 02/28/2022: The wounds remain unchanged. They are dry and fibrotic with accumulation of slough and eschar. She did receive an appointment to see plastic surgery on October 23, but the office called her back and indicated they needed to reschedule and that the next available appointment was not until December. The patient became angry and decided she did not want to see this plastics group. 10/19; the patient sees Dr. Maylene Roes again next week. She is using Medihoney as the primary dressing changing this herself. 03/21/2022: She saw Dr. Ferd Hibbs at the wound care center at [facility]. Dr. Ferd Hibbs was also unable to debride her, secondary to pain and is planning  to take her to the operating room for operative debridement and potential skin substitute placement. Her wounds are unchanged with thick yellow slough and a fibrotic base. She says they are too painful to be debrided. In addition, yesterday her hemoglobin was 4 and she received 2 units packed red blood cell transfusion. 04/04/2022: Her operation at [facility] is scheduled for December 15. She continues to use Medihoney on her wounds. They are a little bit less painful today and she is willing to entertain the possibility of debridement. The wounds measured a little bit larger in all dimensions today but the layer of slough is not as thick as usual. 04/18/2022: Her wounds actually measured a little bit smaller today and the extension onto the dorsal foot from the lateral wound has healed. They are less tender and there is significantly less slough present as compared to prior visits. 05/09/2022: She had to reschedule her surgery due to lack of childcare. It is now planned for January 5. The wounds are basically unchanged, but she had a lot more drainage, which was thick and somewhat purulent in appearance. No significant odor. 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 They are quite painful today and she does not want to have debridement performed. 05/30/2022: The patient underwent surgical debridement and placement of TheraSkin to her wounds on January 5. She has been doing well and does not endorse any pain. The TheraSkin is intact and looks beautiful. It is sutured in place. There is no exudate or significant drainage. 06/04/2022: Her TheraSkin remains intact and I am starting to see granulation tissue emerging underneath the surface. No concern for infection. 06/12/2022: The TheraSkin is intact and there is more granulation tissue emerging. It is starting to look a little bit dry, however. No malodor or purulent drainage. 06/19/2022: The TheraSkin continues to look good. The edges are a bit dry, but the central portion of each of her wounds is showing a nice pink color. 06/27/2022: The TheraSkin on the lateral wound remains almost completely intact. There is nice pink tissue peaking through the mesh of the TheraSkin. On the medial leg, it is starting to breakdown a little bit, but most of the TheraSkin remains intact here, as well. Both wounds measured smaller today. 07/03/2022: The wounds are fairly dry around the perimeter today. Some of the suture is hanging loose. 07/12/2022: Both wounds are measuring smaller today. The medial ulcer is getting a bit too dry. The lateral ulcer continues to have nice buds of granulation tissue emerging. 07/26/2022: The moisture balance on both wounds is much better today. For the first time since I began seeing her, I see actual beefy red granulation tissue on the lateral ankle wound. There are more buds of deep pink granulation tissue emerging on the medial ankle. There is some dry eschar around the edges of the medial wound and a tiny amount of slough overlying the good granulation tissue on the lateral wound. 08/09/2022: Unfortunately, she has a new wound on her left lateral lower leg, just above the  ankle. It is extremely painful for her. It penetrates to the fat layer and has the same woody, fibrotic character as her previous wounds. The other wounds are both a little bit smaller with slough and some eschar around the periphery. 08/14/2022: Her right lateral ankle wound is a little bit smaller. Unfortunately, she has had an increase in her drainage and it has a purulent nature to it, similar to what was present prior to her  surgical debridement. She also reports odor coming from her wounds this week. The left lateral leg wound remains extremely tender. 08/21/2022: The left lateral ankle wound is larger and more painful today. Both right ankle wounds have a little bit more vital appearance, but they have accumulated more slough. She reports less odor this week. She is currently taking Augmentin. 08/28/2022: The left lateral ankle wound continues to enlarge and remains quite painful. Both of the right ankle wounds actually look better this week. There is improved tissue quality with less slough accumulation. 09/27/2022: The left lateral ankle wound actually looks nearly healed. She says that she has been applying some silver alginate that she had leftover. She was unable to afford the Santyl that was prescribed, but found a tube that she had on hand at home and has been using this on her right ankle wounds. These wounds look about the same and have accumulated the usual layer of slough. Unfortunately, a portion of the dorsal foot wound has reopened and is exquisitely painful today. Electronic Signature(s) Signed: 09/27/2022 10:16:53 AM By: Duanne Guess MD FACS Entered By: Duanne Guess on 09/27/2022 10:16:53 [name] ([id number]) [id number] Page 7 of 19 -------------------------------------------------------------------------------- Physical Exam Details Patient Name: Date of Service: [name], [name] 09/27/2022 9:00 A M Medical Record Number:  [id number] Patient Account Number: [id number] Date of Birth/Sex: Treating RN: 01-21-1971 (52 y.o. F) Primary Care Provider: Julianne Handler Other Clinician: Referring Provider: Treating Provider/Extender: Koleen Nimrod Weeks in Treatment: 75 Constitutional . . . . no acute distress. Respiratory Normal work of breathing on room air. Notes 09/27/2022: The left lateral ankle wound actually looks nearly healed. She says that she has been applying some silver alginate that she had left over. She was unable to afford the Santyl that was prescribed, but found a tube that she had on hand at home and has been using this on her right ankle wounds. These wounds look about the same and have accumulated the usual layer of slough. Unfortunately, a portion of the dorsal foot wound has reopened and is exquisitely painful today. Electronic Signature(s) Signed: 09/27/2022 10:17:43 AM By: Duanne Guess MD FACS Entered By: Duanne Guess on 09/27/2022 10:17:43 -------------------------------------------------------------------------------- Physician Orders Details Patient Name: Date of Service: [name], [name] 09/27/2022 9:00 A M Medical Record Number: [id number] Patient Account Number: [id number] Date of Birth/Sex: Treating RN: 10-13-1970 (52 y.o. 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 - Dr. Lady Gary Rm 2. Return A Anesthetic (In clinic) Topical Lidocaine 5% applied to wound bed Bathing/ Shower/  Hygiene May shower and wash wound with soap and water. Edema Control - Lymphedema / SCD / Other Elevate legs to the level of the heart or above for 30 minutes daily and/or when sitting for 3-4 times a day throughout the day. Avoid standing for long periods of time. Exercise regularly Additional Orders / Instructions Follow Nutritious Diet Juven Shake 1-2 times daily. Wound Treatment Wound #17 - Lower Leg Wound Laterality: Right, Lateral Cleanser: Soap and Water 1 x Per Week/30 Days Discharge Instructions: May shower and wash wound with dial antibacterial  soap and water prior to dressing change. Cleanser: Wound Cleanser 1 x Per Week/30 Days Discharge Instructions: Cleanse the wound with wound cleanser prior to applying a clean dressing using gauze sponges, not tissue or cotton balls. Peri-Wound Care: Triamcinolone 15 (g) 1 x Per Week/30 Days Discharge Instructions: Use triamcinolone 15 (g) as directed [name] ([id number]) [id number] Page 8 of 19 Peri-Wound Care: Sween Lotion (Moisturizing lotion) 1 x Per Week/30 Days Discharge Instructions: Apply moisturizing lotion as directed Prim Dressing: Maxorb Extra CMC/Alginate Dressing, 4x4 (in/in) 1 x Per Week/30 Days ary Discharge Instructions: Apply to wound bed as instructed Prim Dressing: Santyl Ointment 1 x Per Week/30 Days ary Discharge Instructions: Apply nickel thick amount to wound bed as instructed Secondary Dressing: ABD Pad, 5x9 1 x Per Week/30 Days Discharge Instructions: Apply over primary dressing as directed. Secondary Dressing: Woven Gauze Sponge, Non-Sterile 4x4 in 1 x Per Week/30 Days Discharge Instructions: Moisten with normal saline and Apply over primary dressing as directed. Secured With: Elastic Bandage 4 inch (ACE bandage) 1 x Per Week/30 Days Discharge Instructions: Secure with ACE bandage as directed. Secured With: [employer], 4.5x3.1 (in/yd) 1 x Per Week/30  Days Discharge Instructions: Secure with Kerlix as directed. Wound #21 - Ankle Wound Laterality: Right, Medial Cleanser: Soap and Water 1 x Per Week/30 Days Discharge Instructions: May shower and wash wound with dial antibacterial soap and water prior to dressing change. Cleanser: Wound Cleanser 1 x Per Week/30 Days Discharge Instructions: Cleanse the wound with wound cleanser prior to applying a clean dressing using gauze sponges, not tissue or cotton balls. Peri-Wound Care: Triamcinolone 15 (g) 1 x Per Week/30 Days Discharge Instructions: Use triamcinolone 15 (g) as directed Peri-Wound Care: Sween Lotion (Moisturizing lotion) 1 x Per Week/30 Days Discharge Instructions: Apply moisturizing lotion as directed Prim Dressing: Maxorb Extra CMC/Alginate Dressing, 4x4 (in/in) 1 x Per Week/30 Days ary Discharge Instructions: Apply to wound bed as instructed Prim Dressing: Santyl Ointment 1 x Per Week/30 Days ary Discharge Instructions: Apply nickel thick amount to wound bed as instructed Secondary Dressing: ABD Pad, 5x9 1 x Per Week/30 Days Discharge Instructions: Apply over primary dressing as directed. Secondary Dressing: Woven Gauze Sponge, Non-Sterile 4x4 in 1 x Per Week/30 Days Discharge Instructions: Moisten with normal saline and Apply over primary dressing as directed. Secured With: Elastic Bandage 4 inch (ACE bandage) 1 x Per Week/30 Days Discharge Instructions: Secure with ACE bandage as directed. Secured With: [employer], 4.5x3.1 (in/yd) 1 x Per Week/30 Days Discharge Instructions: Secure with Kerlix as directed. Wound #24 - Ankle Wound Laterality: Left, Lateral Cleanser: Soap and Water 1 x Per Week/30 Days Discharge Instructions: May shower and wash wound with dial antibacterial soap and water prior to dressing change. Cleanser: Wound Cleanser 1 x Per Week/30 Days Discharge Instructions: Cleanse the wound with wound cleanser prior to applying a clean dressing using gauze  sponges, not tissue or cotton balls. Peri-Wound Care: Triamcinolone 15 (g) 1 x Per Week/30 Days Discharge Instructions: Use triamcinolone 15 (g) as directed Peri-Wound Care: Sween Lotion (Moisturizing lotion) 1 x Per Week/30 Days Discharge Instructions: Apply moisturizing lotion as directed Prim Dressing: Maxorb Extra CMC/Alginate Dressing, 4x4 (in/in) 1 x Per Week/30 Days ary Discharge Instructions: Apply to wound bed as instructed Prim Dressing: Santyl Ointment 1 x Per Week/30 Days ary Discharge Instructions: Apply nickel thick amount to wound bed as instructed Secondary Dressing: ABD Pad, 5x9 1 x Per Week/30 Days Discharge Instructions: Apply over primary dressing as directed. Secondary Dressing:  Woven Gauze Sponge, Non-Sterile 4x4 in 1 x Per Week/30 Days Discharge Instructions: Moisten with normal saline and Apply over primary dressing as directed. CAREY, LONE ([id number]) [id number] Page 9 of 19 Secured With: Elastic Bandage 4 inch (ACE bandage) 1 x Per Week/30 Days Discharge Instructions: Secure with ACE bandage as directed. Secured With: [employer], 4.5x3.1 (in/yd) 1 x Per Week/30 Days Discharge Instructions: Secure with Kerlix as directed. Wound #25 - Foot Wound Laterality: Dorsal, Right Cleanser: Soap and Water 1 x Per Week/30 Days Discharge Instructions: May shower and wash wound with dial antibacterial soap and water prior to dressing change. Cleanser: Wound Cleanser 1 x Per Week/30 Days Discharge Instructions: Cleanse the wound with wound cleanser prior to applying a clean dressing using gauze sponges, not tissue or cotton balls. Peri-Wound Care: Triamcinolone 15 (g) 1 x Per Week/30 Days Discharge Instructions: Use triamcinolone 15 (g) as directed Peri-Wound Care: Sween Lotion (Moisturizing lotion) 1 x Per Week/30 Days Discharge Instructions: Apply moisturizing lotion as directed Prim Dressing: Maxorb Extra CMC/Alginate Dressing, 4x4  (in/in) 1 x Per Week/30 Days ary Discharge Instructions: Apply to wound bed as instructed Prim Dressing: Santyl Ointment 1 x Per Week/30 Days ary Discharge Instructions: Apply nickel thick amount to wound bed as instructed Secondary Dressing: ABD Pad, 5x9 1 x Per Week/30 Days Discharge Instructions: Apply over primary dressing as directed. Secondary Dressing: Woven Gauze Sponge, Non-Sterile 4x4 in 1 x Per Week/30 Days Discharge Instructions: Moisten with normal saline and Apply over primary dressing as directed. Secured With: Elastic Bandage 4 inch (ACE bandage) 1 x Per Week/30 Days Discharge Instructions: Secure with ACE bandage as directed. Secured With: [employer], 4.5x3.1 (in/yd) 1 x Per Week/30 Days Discharge Instructions: Secure with Kerlix as directed. Electronic Signature(s) Signed: 09/27/2022 10:34:50 AM By: Duanne Guess MD FACS Entered By: Duanne Guess on 09/27/2022 10:18:01 -------------------------------------------------------------------------------- Problem List Details Patient Name: Date of Service: [name], [name] 09/27/2022 9:00 A M Medical Record Number: [id number] Patient Account Number: [id number] Date of Birth/Sex: Treating RN: Apr 22, 1971 (52 y.o. F) Primary Care Provider: Julianne Handler Other Clinician: Referring Provider: Treating Provider/Extender: Koleen Nimrod Weeks in Treatment: 73 Active Problems ICD-10 Encounter Code Description Active Date MDM Diagnosis L97.818 Non-pressure chronic ulcer of other part of right lower leg with other specified 04/17/2021 No Yes severity L97.822 Non-pressure chronic ulcer of other part of left lower leg with fat layer 08/09/2022 No Yes exposed 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 04/17/2021 No Yes [name], [name] ([id number]) [id number] Page 10 of 19 Inactive Problems ICD-10 Code Description Active Date Inactive Date L97.828 Non-pressure chronic  ulcer of other part of left lower leg with other specified severity 04/17/2021 04/17/2021 Resolved Problems Electronic Signature(s) Signed: 09/27/2022 10:13:05 AM By: Duanne Guess MD FACS Previous Signature: 09/27/2022 9:36:51 AM Version By: Duanne Guess MD FACS Entered By: Duanne Guess on 09/27/2022 10:13:05 -------------------------------------------------------------------------------- Progress Note Details Patient Name: Date of Service: [name], [name] 09/27/2022 9:00 A M Medical Record Number: [id number] Patient Account Number: [id number] Date of Birth/Sex: Treating RN: 03/04/1971 (52 y.o. F) Primary Care Provider: Julianne Handler Other Clinician: Referring Provider: Treating Provider/Extender: Koleen Nimrod Weeks in Treatment: 71 Subjective Chief Complaint Information obtained from Patient the patient is here for evaluation of her bilateral lower extremity sickle cell ulcers 04/17/2021; patient comes in for substantial wounds on the right and left lower leg History of Present Illness (HPI) The following HPI elements were documented for the patient's wound: Location: medial and lateral  ankle region on the right and left medial malleolus Quality: Patient reports experiencing a shooting pain to affected area(s). Severity: Patient states wound(s) are getting worse. Duration: right lower extremity bimalleolar ulcers have been present for approximately 2 years; the rright meedial malleolus ulcer has been there proximally 6 months Timing: Pain in wound is constant (hurts all the time) Context: The wound would happen gradually Associated Signs and Symptoms: Patient reports having increase discharge. 52 year old patient with a history of sickle cell anemia who was last seen by me with ulceration of the right lower extremity above the ankle and was referred to Dr. Marzetta Board for a surgical debridement as I was unable to do anything in the office due to  excruciating pain. At that stage she was referred from the plastic surgery service to dermatology who treated her for a skin infection with doxycycline and then Levaquin and a local antibiotic ointment. I understand the patient has since developed ulceration on the left ankle both medial and lateral and was now referred back to the wound center as dermatology has finished the management. I do not have any notes from the dermatology department Old notes: 52 year old patient with a history of sickle cell anemia, pain bilateral lower extremities, right lower extremity ulcer and has a history of receiving a skin graft( Theraskin) several months ago. She has been visiting the wound center [facility] and was seen by Dr. Leanord Hawking and Dr. Marzetta Board. after prolonged conservator therapy between July 2016 and January 2017. She had been seen by the plastic surgeon and taken to the OR for debridement and application of Theraskin. She had 3 applications of Theraskin and was then treated with collagen. Prior to that she had a history of similar problems in 2014 and was treated conservatively. Had a reflux study done for the right lower extremity in August 2016 without reflux or DVT . Past medical history significant for sickle cell disease, anemia, leg ulcers, cholelithiasis,and has never been a smoker. Once the patient was discharged on the wound center she says within 2 or 3 weeks the problems recurred and she has been treating it conservatively. since I saw her 3 weeks ago at [facility] she has been unable to get her dressing material but has completed a course of doxycycline. 6/7/ 2017 -- lower extremity venous duplex reflux evaluation was done oo No evidence of SVT or DVT in the RLL. No venous incompetence in the RLL. No further vascular workup is indicated at this time. She was seen by Dr. Mina Marble, on 10/04/2015. She agreed with the plan of taking her to the OR for debridement and application of  theraskin and would also take biopsies to rule out pyoderma gangrenosum. Follow-up note dated May 31 received and she was status post application of Theraskin to multiple ulcers around the right ankle. Pathology did not show evidence of malignancy or pyoderma gangrenosum. She would continue to see as in the wound clinic for further care and see Dr. Marzetta Board as needed. The patient brought the biopsy report and it was consistent with stasis ulcer no evidence of malignancy and the comment was that there was some adjacent neovascularization, fibrosis and patchy perivascular chronic inflammation. 11/15/2015 -- today we applied her first application of Theraskin 11/30/15; TheraSkin #2 12/13/2015 -- she is having a lot of pain locally and is here for possible application of a theraskin today. 01/16/2016 -- the patient has significant pain and has noticed despite in spite of all local care and oral pain medication. It  is impossible to debride her in the office. 02/06/2016 -- I do not see any notes from Dr. Leta Baptist( the patient has not made a call to the office know as she heard from them) and the only visit to recently was with her PCP Dr. Gypsy Decant -- I saw her on 01/16/2016 and prescribed 90 tablets of oxycodone 10 mg and did lab work and screening for HIV. the HIV was negative and hemoglobin was 6.3 with a WBC count of 14.9 and hematocrit of 17.8 with platelets of 561. 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 The bimalleolar ulcers to the right lower extremity have been present for approximately 2 years, the left medial malleolus ulcer has been present approximately 6 months. She has followed with [name] ([id number]) [id number] Page 11 of 19 Dr.Thimmappa in the past and has had a total of 3 applications of Theraskin (01/2015, 09/2015, 06/17/16). She  has also followed with Dr. Meyer Russel here in the clinic and has received 2 applications of TheraSkin (11/10/15, 11/30/15). The patient does experience chronic, and is not amenable to debridement. 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 She is not currently on any antibiotic therapy and has not been treated with any recently. 07/17/2016 -- was seen by Dr. Leta Baptist of plastic surgery who saw her 2 weeks postop application of Theraskin #3. She had removed her dressing and asked her to apply silver alginate on alternate days and follow-up back with the wound center. Future debridements and application of skin substitute would have to be done in the hospital due to her high risk for anesthesia. READMISSION 04/17/2021 Patient is now a 52 year old woman that we have had in this clinic for a prolonged period of time and 2016-2017 and then again for 2 visits in February 2018. At that point she had wounds on the right lower leg predominantly medial. She had also been seen by plastic surgery Dr. Marzetta Board who I believe took her to the OR for operative debridement and application of TheraSkin in 2017. After she left our clinic she was followed for a very prolonged period of time in the wound care center in [facility] who then referred her ultimately to [facility] where she was seen by Dr. Mardene Speak. Again taken her to the OR for skin grafting which apparently did not take. She had multiple other attempts at dressings although I have not really looked over all of these notes in great detail. She has not been seen in a wound care center in about a year. She states over the last year in addition to her right lower leg she has developed wounds on the left lower leg quite extensive. She is using Xeroform to all of these wounds without really any improvement. She also has Medicaid which does not cover wound products. The patient has had vascular work-ups in the past including most  recently on 03/28/2021 showing biphasic waveforms on the right triphasic at the PTA and biphasic at the dorsalis pedis on the left. She was unable to tolerate any degree of compression to do ABIs. Unfortunately TBI's were also not done. She had venous reflux studies done in 2017. This did not show any evidence of a DVT or SVT and no venous incompetence was noted in the right leg at the time this was the only side with the wound As noted I did not  look all over her old records. She apparently had a course of HBO and Baptist although I am not sure what the indication would have been. In any case she developed seizures and terminated treatment earlier. She is generally much more disabled than when we last saw her in clinic. She can no longer walk pretty much wheelchair-bound because predominantly of pain in the left hip. 04/24/2021; the patient tolerated the wraps we put on. We used Santyl and Hydrofera Blue under compression. I brought her back for a nurse visit for a change in dressing. With Medicaid we will have a hard time getting anything paid for and hence the need for compression. She arrives in clinic with all the wounds looking somewhat better in terms of surface 12/20; circumferential wound on the right from the lateral to the medial. She has open areas on the left medial and left lateral x2 on all of this with the same surface. This does not look completely healthy although she does have some epithelialization. She is not complaining of a lot of pain which is unusual for her sickle ulcers. I have not looked over her extensive records from [facility] She had recent arterial studies and has a history of venous reflux studies I will need to look these over although I do not believe she has significant arterial disease 2023 05/22/2021; patient's wound areas measure slightly smaller. Still a lot of drainage coming from the right we have been using Hydrofera Blue and Santyl with some improvement in the  wound surfaces. She tells me she will be getting transfused later in the week for her underlying sickle cell anemia I have looked over her recent arterial studies which were done in the fall. This was in November and showed biphasic and triphasic waveforms but she could not tolerate ABIs because of pressure and unfortunately TBI's were not done. She has not had recent venous reflux studies that I can see 1/10; not much change about the same surface area. This has a yellowish surface to it very gritty. We have been using Santyl and Hydrofera Blue for a prolonged period. Culture I did last week showed methicillin sensitive staph aureus "rare". Our intake nurse reports greenish drainage which may be the Hydrofera Blue itself 1/17; wounds are continue to measure smaller although I am not sure about the accuracy here. Especially the areas on the right are covered in what looks to be a nonviable surface although she does have some epithelialization. Similarly she has areas on the left medial and left lateral ankle area which appear to have a better surface and perhaps are slightly smaller. We have been using Santyl and Hydrofera Blue. She cannot tolerate mechanical debridements She went for her reflux studies which showed significant reflux at the greater saphenous vein at the saphenofemoral junction as well as the greater saphenous vein in the proximal calf on the left she had reflux in the thigh and the common femoral vein and supra vein Fishel vein reflux in the greater saphenous vein. I will have vein and vascular look at this. My thoughts have been that these are likely sickle wounds. I looked through her old records from [facility] wound care center and then when she graduated to [facility] wound care center where she saw Dr. Ronda Fairly and Dr. Mardene Speak. Although I can see she had reflux studies done I do not see that she actually saw a vein and vascular. I went over the fact that she had operative  debridements and  actual skin grafting that did not take. I do not think these wounds have ever really progressed towards healing 1/31;Substantial wounds on the right ankle area. Hyper granulated very gritty adherent debris on the surface. 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 On the left she has no DVT or SVT deep vein reflux in the common femoral vein. Superficial vein reflux in the greater saphenous vein on although the vein diameter is not really all that large. I do not think there is anything that can be done with these although I am going to send her for consultation to vein and vascular. 2/7; Wound exam; substantial wound area on the right posterior ankle area and areas on the left medial ankle and left lateral ankle. I was able to debride the left medial ankle last week fairly aggressively and it is back this week to a completely nonviable surface She will see vascular surgery this Friday and I would like them to review the venous studies and also any comments on her arterial status. If they do not see an issue here I am going to refer her to plastic surgery for an operative debridement perhaps intraoperative ACell or Integra. Eventually she will require a deep tissue culture again 2/14; substantial wound area on the right posterior ankle, medial ankle. We have been using silver alginate The patient was seen by vein and vascular she had both venous reflux studies and arterial studies. 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 She had no evidence of  superficial venous thrombosis. She did have superficial vein reflux at the saphenofemoral junction and the greater saphenous vein. On the left no evidence of a DVT no evidence of superficial venous throat thrombosis she did have deep vein reflux in the common femoral vein and superficial vein reflux in the greater saphenous vein but these were not felt to be amenable to ablation. In terms of arterial studies she had triphasic and wife biphasic wave waveforms bilaterally not felt to have a significant arterial issue. I do not get the feeling that they felt that any part of her nonhealing wounds were related to either arterial or venous issues. They did note that she had venous reflux at the right at the [facility] and [facility]. And also on the left there were reflux in the deep system at the common femoral vein and greater saphenous vein in the proximal thigh. Nothing amenable to ablation. 2/20; she is making some decent progress on the right where there is nice skin between the 2 open areas on the right ankle. The surfaces here do not look viable yet there is some surrounding epithelialization. She still has a small area on the left medial ankle area. Hyper-granulated Jody's away always 2/28 patient has an appointment with plastic surgery on 3/8. We will see her back on 3/9. She may have to call us to get the area redressed. RALIYAH, TAVAKOLI ([id number]) [id number] Page 12 of 38 We've been using Santyl under silver alginate. We made a nice improvement on the left medial ankle. The larger  wounds on the right also looks somewhat better in terms of epithelialization although I think they could benefit from an aggressive debridement if plastic surgery would be willing to do that. Perhaps placement of Integra or a cell 07/26/2021: She saw Dr. Arita Miss yesterday. He raised the question as to whether or not this might be pyoderma and wanted to wait until that question was answered by dermatology  before proceeding with any sort of operative debridement. We have continue to use Santyl under silver alginate with Kerlix and Coban wraps. Overall, her wounds appear to be continuing to contract and epithelialize, with some granulation tissue present. There continues to be some slough on all wound surfaces. 08/09/2021: She has not been able to get an appointment with dermatology because apparently the offices in [facility] [place] do not accept Medicaid. She is looking into whether or not she can be seen at the main [facility] dermatology clinic. This is necessary because plastic surgery is concerned that her wounds might represent pyoderma and they did not want to do any procedure until that was clarified. We have been using Santyl under silver alginate with Kerlix and Coban wraps. Today, there was a greater amount of drainage on her dressings with a slight green discoloration and significant odor. Despite this, her wounds continue to contract and epithelialize. There is pale granulation tissue present and actually, on the left medial ankle, the granulation tissue is a bit hypertrophic. 08/16/2021: Last week, I took a culture and this grew back rare methicillin-resistant Staph aureus and rare corynebacterium. The MRSA was sensitive to gentamicin which we began applying topically on an empiric basis. This week, her wounds are a bit smaller and the drainage and odor are less. Her primary care provider is working on assisting the patient with a dermatology evaluation. She has been in silver alginate over the gentamicin that was started last week along with Kerlix and Coban wraps. 08/23/2021: Because she has Medicaid, we have been unable to get her into see any dermatologist in the [place] to rule out pyoderma gangrenosum, which was a requirement from plastic surgery prior to any sort of debridement and grafting. Despite this, however, all of her wounds continue to get smaller. The wound  on her left medial ankle is nearly closed. There is no odor from the wounds, although she still accumulates a modest amount of drainage on her dressings. 08/30/2021: The lateral right ankle wound and the medial left ankle wound are a bit smaller today. The medial right ankle wound is about the same size. They are less tender. We have still been unable to get her into dermatology. 09/06/2021: All of the wounds are about the same size today. She continues to endorse minimal pain. I communicated with Dr. Arita Miss in plastic surgery regarding our issues getting a dermatology appointment; he was out of town but indicated that he would look into perhaps performing the biopsy in his office and will have his office contact her. 09/14/2021: The patient has an appointment in dermatology, but it is not until October. Her wounds are roughly the same; she continues to have very thick purulent-looking drainage on her dressings. 09/20/2021: The left medial wound is nearly closed and just has a bit of accumulated eschar on the surface. The right medial and lateral ankle wounds are perhaps a little bit smaller. They continue to have a very pale surface with accumulation of thin slough. 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 I did  not think Jodie Echevaria was indicated based on this. She is getting topical mupirocin with Prisma silver collagen. 10/04/2021: The patient was not seen in clinic last week due to childcare coverage issues. In the interim, the left medial leg wound has closed. The right sided leg wounds are smaller. There is more granulation tissue coming through, particularly on the lateral wound. The surface remains somewhat gritty. We have been applying topical mupirocin and Prisma silver collagen. 10/11/2021: The left medial leg wound remains closed. She does complain of some anesthetic sensation to the area. Both of the right-sided leg wounds are smaller but still have accumulated  slough. 10/18/2021: Both right-sided leg wounds are minimally smaller this week. She still continues to accumulate slough and has thick drainage on her dressings. 10/23/2021: Both wounds continue to contract. There is still slough buildup. She has been approved for a keratin-based skin substitute trial product but it will not be available until next week. 10/30/2021: The wounds are about the same to perhaps slightly smaller. There is still continued slough buildup. Unfortunately, the rep for the keratin based product did not show up today and did not answer his phone when called. 11/08/2021: The wounds are little bit smaller today. She continues to have thick drainage but the surfaces are relatively clean with just a little bit of slough accumulation. She reported to me today that she is unable to completely flex her left ankle and on examination it seems this is potentially related to scar tissue from her wounds. We do have the ProgenaMatrix trial product available for her today. 11/15/2021: Both wounds are smaller today. There is some slough accumulation on the surfaces, but the medial wound, in particular looks like it is filling in and is less deep. She did hear from physical therapy and she is going to start working with them on July 11. She is here for her second application of the trial skin substitute, ProgenaMatrix. 11/22/2021: Both wounds continue to contract, the medial more dramatically than the lateral. Both wounds have a layer of slough on the surface, but underneath this, the gritty fibrous tissue has a little bit more of a pink cast to it rather than being as pale as it has been. 11/29/2021: The wounds are roughly the same size this week, perhaps a millimeter or 2 smaller. The medial wound has filled in and is nearly flush with the surrounding skin surface. She continues to have a lot of slough accumulation on both surfaces. 12/06/2021: No significant change to her wounds, but she has a new  opening on her dorsal foot, just distal to the right lateral ankle wound. The area on her left medial ankle that reopened looks a little bit larger today. She has quite a bit of pain associated with the new wound. 12/12/2021: Her wounds look about the same but the new opening on her right lateral dorsal foot is a little bit bigger. She continues to have a fair amount of pain with this wound. 12/26/2021: The left medial ankle wound is tiny and superficial. She has 2 areas of crusting on her left lateral ankle, however, that appear to be threatening to open again. Her right medial ankle wound is a little bit smaller today but still continues to accumulate thick rubbery slough. The new dorsal foot wound is exquisitely painful but there is no odor or purulent drainage. No erythema or induration. The right lateral ankle wound looks about the same today, again with thick rubbery slough. 01/03/2022: The left medial ankle wound  has closed again. Both right ankle wounds appear to be about the same size with thick rubbery slough. The dorsal foot wound on the right continues to be quite painful and she stated that she did not want any debridement of that site today. 01/10/2022: No real change to any of her wounds. She continues to accumulate thick slough. The dorsal foot wound has merged with the lateral malleolar wound. She is experiencing significant pain in the dorsal foot portion of the ulcer. 01/16/2022: Absolutely no change or progress in her wounds. 01/24/2022: Her wounds are unchanged. She continues to build up slough and the wounds on her dorsal right foot are still exquisitely tender. 01/31/2022: The wounds actually measure a little bit narrower today. They still have thick slough on the surface but the underlying tissue seems a little less fibrotic. We changed to Iodosorb last week. [name], [name] ([id number]) [id number] Page 13 of 19 02/07/2022: Wounds continue to slowly  epithelialize around the parameters. She has less pain today. She still accumulates a fairly substantial layer of slough. 02/15/2022: No change to the wounds overall. The measured a little bit larger today per the intake nurse. She continues to have substantial slough accumulation and her pain is a little bit worse. 02/21/2022. No change at all to her wounds. I do not see that plastic surgery has received her referral yet. 02/28/2022: The wounds remain unchanged. They are dry and fibrotic with accumulation of slough and eschar. She did receive an appointment to see plastic surgery on October 23, but the office called her back and indicated they needed to reschedule and that the next available appointment was not until December. The patient became angry and decided she did not want to see this plastics group. 10/19; the patient sees Dr. Maylene Roes again next week. She is using Medihoney as the primary dressing changing this herself. 03/21/2022: She saw Dr. Ferd Hibbs at the wound care center at [facility]. Dr. Ferd Hibbs was also unable to debride her, secondary to pain and is planning to take her to the operating room for operative debridement and potential skin substitute placement. Her wounds are unchanged with thick yellow slough and a fibrotic base. She says they are too painful to be debrided. In addition, yesterday her hemoglobin was 4 and she received 2 units packed red blood cell transfusion. 04/04/2022: Her operation at [facility] is scheduled for December 15. She continues to use Medihoney on her wounds. They are a little bit less painful today and she is willing to entertain the possibility of debridement. The wounds measured a little bit larger in all dimensions today but the layer of slough is not as thick as usual. 04/18/2022: Her wounds actually measured a little bit smaller today and the extension onto the dorsal foot from the lateral wound has healed. They are less tender  and there is significantly less slough present as compared to prior visits. 05/09/2022: She had to reschedule her surgery due to lack of childcare. It is now planned for January 5. The wounds are basically unchanged, but she had a lot more drainage, which was thick and somewhat purulent in appearance. No significant odor. 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 They are quite painful today and she does not want to have debridement performed. 05/30/2022: The patient underwent surgical debridement and placement of TheraSkin to her wounds on January 5. She has been doing well and does not endorse any pain. The TheraSkin is intact and looks beautiful. It is  sutured in place. There is no exudate or significant drainage. 06/04/2022: Her TheraSkin remains intact and I am starting to see granulation tissue emerging underneath the surface. No concern for infection. 06/12/2022: The TheraSkin is intact and there is more granulation tissue emerging. It is starting to look a little bit dry, however. No malodor or purulent drainage. 06/19/2022: The TheraSkin continues to look good. The edges are a bit dry, but the central portion of each of her wounds is showing a nice pink color. 06/27/2022: The TheraSkin on the lateral wound remains almost completely intact. There is nice pink tissue peaking through the mesh of the TheraSkin. On the medial leg, it is starting to breakdown a little bit, but most of the TheraSkin remains intact here, as well. Both wounds measured smaller today. 07/03/2022: The wounds are fairly dry around the perimeter today. Some of the suture is hanging loose. 07/12/2022: Both wounds are measuring smaller today. The medial ulcer is getting a bit too dry. The lateral ulcer continues to have nice buds of granulation tissue emerging. 07/26/2022: The moisture balance on both wounds is much better today. For the first time since I began seeing her, I see actual beefy red granulation tissue on the  lateral ankle wound. There are more buds of deep pink granulation tissue emerging on the medial ankle. There is some dry eschar around the edges of the medial wound and a tiny amount of slough overlying the good granulation tissue on the lateral wound. 08/09/2022: Unfortunately, she has a new wound on her left lateral lower leg, just above the ankle. It is extremely painful for her. It penetrates to the fat layer and has the same woody, fibrotic character as her previous wounds. The other wounds are both a little bit smaller with slough and some eschar around the periphery. 08/14/2022: Her right lateral ankle wound is a little bit smaller. Unfortunately, she has had an increase in her drainage and it has a purulent nature to it, similar to what was present prior to her surgical debridement. She also reports odor coming from her wounds this week. The left lateral leg wound remains extremely tender. 08/21/2022: The left lateral ankle wound is larger and more painful today. Both right ankle wounds have a little bit more vital appearance, but they have accumulated more slough. She reports less odor this week. She is currently taking Augmentin. 08/28/2022: The left lateral ankle wound continues to enlarge and remains quite painful. Both of the right ankle wounds actually look better this week. There is improved tissue quality with less slough accumulation. 09/27/2022: The left lateral ankle wound actually looks nearly healed. She says that she has been applying some silver alginate that she had leftover. She was unable to afford the Santyl that was prescribed, but found a tube that she had on hand at home and has been using this on her right ankle wounds. These wounds look about the same and have accumulated the usual layer of slough. Unfortunately, a portion of the dorsal foot wound has reopened and is exquisitely painful today. Patient History Information obtained from Patient. Family History Diabetes -  Mother, Lung Disease - Mother, No family history of Cancer, Heart Disease, Hereditary Spherocytosis, Hypertension, Kidney Disease, Seizures, Stroke, Thyroid Problems, Tuberculosis. Social History Never smoker, Marital Status - Married, Alcohol Use - Never, Drug Use - No History, Caffeine Use - Daily. Medical History Eyes Denies history of Cataracts, Glaucoma, Optic Neuritis Ear/Nose/Mouth/Throat Denies history of Chronic sinus problems/congestion, Middle ear problems Hematologic/Lymphatic  Patient has history of Anemia, Sickle Cell Disease Denies history of Hemophilia, Human Immunodeficiency Virus, Lymphedema Respiratory Denies history of Aspiration, Asthma, Chronic Obstructive Pulmonary Disease (COPD), Pneumothorax, Sleep Apnea, Tuberculosis Cardiovascular PRUSAK, Felecia Shelling ([id number]) [id number] Page 14 of 19 Denies history of Angina, Arrhythmia, Congestive Heart Failure, Coronary Artery Disease, Deep Vein Thrombosis, Hypertension, Hypotension, Myocardial Infarction, Peripheral Arterial Disease, Peripheral Venous Disease, Phlebitis, Vasculitis Gastrointestinal Denies history of Cirrhosis , Colitis, Crohnoos, Hepatitis A, Hepatitis B, Hepatitis C Endocrine Denies history of Type I Diabetes, Type II Diabetes Genitourinary Denies history of End Stage Renal Disease Immunological Denies history of Lupus Erythematosus, Raynaudoos, Scleroderma Integumentary (Skin) Denies history of History of Burn Musculoskeletal Denies history of Gout, Rheumatoid Arthritis, Osteoarthritis, Osteomyelitis Neurologic Patient has history of Neuropathy - right foot intermittant Denies history of Dementia, Quadriplegia, Paraplegia, Seizure Disorder Oncologic Denies history of Received Chemotherapy, Received Radiation Psychiatric Denies history of Anorexia/bulimia, Confinement Anxiety Hospitalization/Surgery History - c section x2. - left breast lumpectomy. - iandD right ankle  with theraskin. Medical A Surgical History Notes nd Constitutional Symptoms (General Health) H/O miscarriage Cardiovascular bradycardia Gastrointestinal cholilithiasis Objective Constitutional no acute distress. Vitals Time Taken: 9:09 AM, Height: 67 in, Weight: 134 lbs, BMI: 21, Temperature: 98.6 F, Pulse: 82 bpm, Respiratory Rate: 20 breaths/min, Blood Pressure: 117/67 mmHg. Respiratory Normal work of breathing on room air. General Notes: 09/27/2022: The left lateral ankle wound actually looks nearly healed. She says that she has been applying some silver alginate that she had left over. She was unable to afford the Santyl that was prescribed, but found a tube that she had on hand at home and has been using this on her right ankle wounds. These wounds look about the same and have accumulated the usual layer of slough. Unfortunately, a portion of the dorsal foot wound has reopened and is exquisitely painful today. Integumentary (Hair, Skin) Wound #17 status is Open. Original cause of wound was Gradually Appeared. The date acquired was: 10/05/2012. The wound has been in treatment 75 weeks. The wound is located on the Right,Lateral Lower Leg. The wound measures 7.3cm length x 6cm width x 0.1cm depth; 34.4cm^2 area and 3.44cm^3 volume. There is Fat Layer (Subcutaneous Tissue) exposed. There is no tunneling or undermining noted. There is a medium amount of purulent drainage noted. The wound margin is distinct with the outline attached to the wound base. There is small (1-33%) pink granulation within the wound bed. There is a large (67-100%) amount of necrotic tissue within the wound bed including Adherent Slough. The periwound skin appearance had no abnormalities noted for moisture. The periwound skin appearance exhibited: Scarring, Hemosiderin Staining. The periwound skin appearance did not exhibit: Ecchymosis. Periwound temperature was noted as No Abnormality. The periwound has tenderness  on palpation. Wound #21 status is Open. Original cause of wound was Gradually Appeared. The date acquired was: 06/26/2021. The wound has been in treatment 65 weeks. The wound is located on the Right,Medial Ankle. The wound measures 7.5cm length x 3.7cm width x 0.1cm depth; 21.795cm^2 area and 2.179cm^3 volume. There is Fat Layer (Subcutaneous Tissue) exposed. There is no tunneling or undermining noted. There is a medium amount of purulent drainage noted. The wound margin is distinct with the outline attached to the wound base. There is small (1-33%) pink granulation within the wound bed. There is a large (67-100%) amount of necrotic tissue within the wound bed including Adherent Slough. The periwound skin appearance had no abnormalities noted for moisture. The periwound skin appearance had no abnormalities noted  for color. The periwound skin appearance exhibited: Scarring. Periwound temperature was noted as No Abnormality. The periwound has tenderness on palpation. Wound #24 status is Open. Original cause of wound was Gradually Appeared. The date acquired was: 08/06/2022. The wound has been in treatment 7 weeks. The wound is located on the Left,Lateral Ankle. The wound measures 0.2cm length x 0.2cm width x 0.1cm depth; 0.031cm^2 area and 0.003cm^3 volume. There is Fat Layer (Subcutaneous Tissue) exposed. There is no tunneling or undermining noted. There is a small amount of serosanguineous drainage noted. The wound margin is distinct with the outline attached to the wound base. There is large (67-100%) pink granulation within the wound bed. There is a small (1-33%) amount of necrotic tissue within the wound bed including Eschar and Adherent Slough. The periwound skin appearance had no abnormalities noted for texture. The periwound skin appearance had no abnormalities noted for moisture. The periwound skin appearance had no abnormalities noted for color. Periwound temperature was noted as No Abnormality.  The periwound has tenderness on palpation. Wound #25 status is Open. Original cause of wound was Gradually Appeared. The date acquired was: 08/30/2022. The wound is located on the Right,Dorsal Foot. The wound measures 2.3cm length x 0.7cm width x 0.1cm depth; 1.264cm^2 area and 0.126cm^3 volume. There is Fat Layer (Subcutaneous Tissue) exposed. There is no tunneling or undermining noted. There is a medium amount of serosanguineous drainage noted. The wound margin is flat and intact. There is no granulation within the wound bed. There is a large (67-100%) amount of necrotic tissue within the wound bed including Eschar and Adherent Slough. The periwound skin appearance had no abnormalities noted for moisture. The periwound skin appearance had no abnormalities noted for color. The periwound skin appearance exhibited: Scarring. Periwound temperature was noted as No Abnormality. The periwound has tenderness on palpation. 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 There was a Selective/Open Wound Non-Viable Tissue Debridement with a total area of 3.44 sq cm performed by Duanne Guess, MD. With the following instrument(s): Curette to remove Non-Viable tissue/material. Material removed includes [facility] after achieving pain control using Lidocaine 4% Topical Solution. No specimens were taken. A time out was conducted at 10:00, prior to the start of the procedure. A Minimum amount of bleeding was controlled with Pressure. The procedure was tolerated well with a pain level of 0 throughout and a pain level of 3  following the procedure. Post Debridement Measurements: 7.3cm length x 0.6cm width x 0.1cm depth; 0.344cm^3 volume. Character of Wound/Ulcer Post Debridement is improved. Post procedure Diagnosis Wound #17: Same as Pre-Procedure General Notes: Scribed for Dr Lady Gary by Brenton Grills RN.Marland Kitchen Wound #21 Pre-procedure diagnosis of Wound #21 is a Sickle Cell Lesion located on the Right,Medial Ankle .Severity of Tissue Pre Debridement is: Fat layer exposed. There was a Selective/Open Wound Non-Viable Tissue Debridement with a total area of 21.78 sq cm performed by Duanne Guess, MD. With the following instrument(s): Curette to remove Non-Viable tissue/material. Material removed includes [facility] after achieving pain control using Lidocaine 4% Topical Solution. No specimens were taken. A time out was conducted at 10:00, prior to the start of the procedure. A Minimum amount of bleeding  was controlled with Pressure. The procedure was tolerated well with a pain level of 0 throughout and a pain level of 3 following the procedure. Post Debridement Measurements: 7.5cm length x 3.7cm width x 0.1cm depth; 2.179cm^3 volume. Character of Wound/Ulcer Post Debridement is improved. Severity of Tissue Post Debridement is: Fat layer exposed. Post procedure Diagnosis Wound #21: Same as Pre-Procedure General Notes: Scribed for Dr Lady Gary by Brenton Grills RN.Marland Kitchen Wound #24 Pre-procedure diagnosis of Wound #24 is a Sickle Cell Lesion located on the Left,Lateral Ankle . There was a Selective/Open Wound Non-Viable Tissue Debridement with a total area of 0.03 sq cm performed by Duanne Guess, MD. With the following instrument(s): Curette to remove Non-Viable tissue/material. Material removed includes [facility] after achieving pain control using Lidocaine 4% Topical Solution. No specimens were taken. A time out was conducted at 10:00, prior to the start of the procedure. A Minimum amount of bleeding was controlled with Pressure. The  procedure was tolerated well with a pain level of 0 throughout and a pain level of 3 following the procedure. Post Debridement Measurements: 0.2cm length x 0.2cm width x 0.1cm depth; 0.003cm^3 volume. Character of Wound/Ulcer Post Debridement is improved. Post procedure Diagnosis Wound #24: Same as Pre-Procedure General Notes: Scribed for Dr Lady Gary by Brenton Grills RN.Marland Kitchen Plan Follow-up Appointments: Return Appointment in 2 weeks. - Dr. Lady Gary Rm 2. Anesthetic: (In clinic) Topical Lidocaine 5% applied to wound bed Bathing/ Shower/ Hygiene: May shower and wash wound with soap and water. Edema Control - Lymphedema / SCD / Other: Elevate legs to the level of the heart or above for 30 minutes daily and/or when sitting for 3-4 times a day throughout the day. Avoid standing for long periods of time. Exercise regularly Additional Orders / Instructions: Follow Nutritious Diet Juven Shake 1-2 times daily. WOUND #17: - Lower Leg Wound Laterality: Right, Lateral Cleanser: Soap and Water 1 x Per Week/30 Days Discharge Instructions: May shower and wash wound with dial antibacterial soap and water prior to dressing change. Cleanser: Wound Cleanser 1 x Per Week/30 Days Discharge Instructions: Cleanse the wound with wound cleanser prior to applying a clean dressing using gauze sponges, not tissue or cotton balls. Peri-Wound Care: Triamcinolone 15 (g) 1 x Per Week/30 Days Discharge Instructions: Use triamcinolone 15 (g) as directed Peri-Wound Care: Sween Lotion (Moisturizing lotion) 1 x Per Week/30 Days Discharge Instructions: Apply moisturizing lotion as directed Prim Dressing: Maxorb Extra CMC/Alginate Dressing, 4x4 (in/in) 1 x Per Week/30 Days ary Discharge Instructions: Apply to wound bed as instructed Prim Dressing: Santyl Ointment 1 x Per Week/30 Days ary Discharge Instructions: Apply nickel thick amount to wound bed as instructed Secondary Dressing: ABD Pad, 5x9 1 x Per Week/30 Days Discharge  Instructions: Apply over primary dressing as directed. Secondary Dressing: Woven Gauze Sponge, Non-Sterile 4x4 in 1 x Per Week/30 Days PREETHI, [name] ([id number]) [id number].pdf Page 16 of 19 Discharge Instructions: Moisten with normal saline and Apply over primary dressing as directed. Secured With: Elastic Bandage 4 inch (ACE bandage) 1 x Per Week/30 Days Discharge Instructions: Secure with ACE bandage as directed. Secured With: [employer], 4.5x3.1 (in/yd) 1 x Per Week/30 Days Discharge Instructions: Secure with Kerlix as directed. WOUND #21: - Ankle Wound Laterality: Right, Medial Cleanser: Soap and Water 1 x Per Week/30 Days Discharge Instructions: May shower and wash wound with dial antibacterial soap and water prior to dressing change. Cleanser: Wound Cleanser 1 x Per Week/30 Days Discharge Instructions: Cleanse the wound with wound cleanser prior to applying a  clean dressing using gauze sponges, not tissue or cotton balls. Peri-Wound Care: Triamcinolone 15 (g) 1 x Per Week/30 Days Discharge Instructions: Use triamcinolone 15 (g) as directed Peri-Wound Care: Sween Lotion (Moisturizing lotion) 1 x Per Week/30 Days Discharge Instructions: Apply moisturizing lotion as directed Prim Dressing: Maxorb Extra CMC/Alginate Dressing, 4x4 (in/in) 1 x Per Week/30 Days ary Discharge Instructions: Apply to wound bed as instructed Prim Dressing: Santyl Ointment 1 x Per Week/30 Days ary Discharge Instructions: Apply nickel thick amount to wound bed as instructed Secondary Dressing: ABD Pad, 5x9 1 x Per Week/30 Days Discharge Instructions: Apply over primary dressing as directed. Secondary Dressing: Woven Gauze Sponge, Non-Sterile 4x4 in 1 x Per Week/30 Days Discharge Instructions: Moisten with normal saline and Apply over primary dressing as directed. Secured With: Elastic Bandage 4 inch (ACE bandage) 1 x Per Week/30 Days Discharge Instructions: Secure with ACE  bandage as directed. Secured With: [employer], 4.5x3.1 (in/yd) 1 x Per Week/30 Days Discharge Instructions: Secure with Kerlix as directed. WOUND #24: - Ankle Wound Laterality: Left, Lateral Cleanser: Soap and Water 1 x Per Week/30 Days Discharge Instructions: May shower and wash wound with dial antibacterial soap and water prior to dressing change. Cleanser: Wound Cleanser 1 x Per Week/30 Days Discharge Instructions: Cleanse the wound with wound cleanser prior to applying a clean dressing using gauze sponges, not tissue or cotton balls. Peri-Wound Care: Triamcinolone 15 (g) 1 x Per Week/30 Days Discharge Instructions: Use triamcinolone 15 (g) as directed Peri-Wound Care: Sween Lotion (Moisturizing lotion) 1 x Per Week/30 Days Discharge Instructions: Apply moisturizing lotion as directed Prim Dressing: Maxorb Extra CMC/Alginate Dressing, 4x4 (in/in) 1 x Per Week/30 Days ary Discharge Instructions: Apply to wound bed as instructed Prim Dressing: Santyl Ointment 1 x Per Week/30 Days ary Discharge Instructions: Apply nickel thick amount to wound bed as instructed Secondary Dressing: ABD Pad, 5x9 1 x Per Week/30 Days Discharge Instructions: Apply over primary dressing as directed. Secondary Dressing: Woven Gauze Sponge, Non-Sterile 4x4 in 1 x Per Week/30 Days Discharge Instructions: Moisten with normal saline and Apply over primary dressing as directed. Secured With: Elastic Bandage 4 inch (ACE bandage) 1 x Per Week/30 Days Discharge Instructions: Secure with ACE bandage as directed. Secured With: [employer], 4.5x3.1 (in/yd) 1 x Per Week/30 Days Discharge Instructions: Secure with Kerlix as directed. WOUND #25: - Foot Wound Laterality: Dorsal, Right Cleanser: Soap and Water 1 x Per Week/30 Days Discharge Instructions: May shower and wash wound with dial antibacterial soap and water prior to dressing change. Cleanser: Wound Cleanser 1 x Per Week/30 Days Discharge  Instructions: Cleanse the wound with wound cleanser prior to applying a clean dressing using gauze sponges, not tissue or cotton balls. Peri-Wound Care: Triamcinolone 15 (g) 1 x Per Week/30 Days Discharge Instructions: Use triamcinolone 15 (g) as directed Peri-Wound Care: Sween Lotion (Moisturizing lotion) 1 x Per Week/30 Days Discharge Instructions: Apply moisturizing lotion as directed Prim Dressing: Maxorb Extra CMC/Alginate Dressing, 4x4 (in/in) 1 x Per Week/30 Days ary Discharge Instructions: Apply to wound bed as instructed Prim Dressing: Santyl Ointment 1 x Per Week/30 Days ary Discharge Instructions: Apply nickel thick amount to wound bed as instructed Secondary Dressing: ABD Pad, 5x9 1 x Per Week/30 Days Discharge Instructions: Apply over primary dressing as directed. Secondary Dressing: Woven Gauze Sponge, Non-Sterile 4x4 in 1 x Per Week/30 Days Discharge Instructions: Moisten with normal saline and Apply over primary dressing as directed. Secured With: Elastic Bandage 4 inch (ACE bandage) 1 x Per  Week/30 Days Discharge Instructions: Secure with ACE bandage as directed. Secured With: [employer], 4.5x3.1 (in/yd) 1 x Per Week/30 Days Discharge Instructions: Secure with Kerlix as directed. 09/27/2022: The left lateral ankle wound actually looks nearly healed. She says that she has been applying some silver alginate that she had leftover. She was unable to afford the Santyl that was prescribed, but found a tube that she had on hand at home and has been using this on her right ankle wounds. These wounds look about the same and have accumulated the usual layer of slough. Unfortunately, a portion of the dorsal foot wound has reopened and is exquisitely painful today. I used a curette to debride slough from both right ankle wounds and the left lateral ankle wound. The dorsal foot wound was too painful to permit debridement. We will continue silver alginate to the left lateral ankle  wound. Continue Santyl and add silver alginate to both ankle and dorsal foot wounds on the right. Continue Kerlix and Ace bandage wrapping. She will follow-up in 2 weeks. Electronic Signature(s) Signed: 09/27/2022 10:19:19 AM By: Duanne Guess MD FACS Entered By: Duanne Guess on 09/27/2022 10:19:19 [name] ([id number]) [id number] Page 17 of 19 -------------------------------------------------------------------------------- HxROS Details Patient Name: Date of Service: [name] 09/27/2022 9:00 A M Medical Record Number: [id number] Patient Account Number: [id number] Date of Birth/Sex: Treating RN: Apr 23, 1971 (52 y.o. F) Primary Care Provider: Julianne Handler Other Clinician: Referring Provider: Treating Provider/Extender: Koleen Nimrod Weeks in Treatment: 22 Information Obtained From Patient Constitutional Symptoms (General Health) Medical History: Past Medical History Notes: H/O miscarriage Eyes Medical History: Negative for: Cataracts; Glaucoma; Optic Neuritis Ear/Nose/Mouth/Throat Medical History: Negative for: Chronic sinus problems/congestion; Middle ear problems Hematologic/Lymphatic Medical History: Positive for: Anemia; Sickle Cell Disease Negative for: Hemophilia; Human Immunodeficiency Virus; Lymphedema Respiratory Medical History: Negative for: Aspiration; Asthma; Chronic Obstructive Pulmonary Disease (COPD); Pneumothorax; Sleep Apnea; Tuberculosis Cardiovascular Medical History: Negative for: Angina; Arrhythmia; Congestive Heart Failure; Coronary Artery Disease; Deep Vein Thrombosis; Hypertension; Hypotension; Myocardial Infarction; Peripheral Arterial Disease; Peripheral Venous Disease; Phlebitis; Vasculitis Past Medical History Notes: bradycardia Gastrointestinal Medical History: Negative for: Cirrhosis ; Colitis; Crohns; Hepatitis A; Hepatitis B; Hepatitis C Past Medical History  Notes: cholilithiasis Endocrine Medical History: Negative for: Type I Diabetes; Type II Diabetes Genitourinary Medical History: Negative for: End Stage Renal Disease Immunological Medical History: Negative for: Lupus Erythematosus; Raynauds; Scleroderma Integumentary (Skin) Medical History: Negative for: History of Burn Musculoskeletal Medical History: Negative for: Gout; Rheumatoid Arthritis; Osteoarthritis; Osteomyelitis [name] ([id number]) [id number] Page 18 of 19 Neurologic Medical History: Positive for: Neuropathy - right foot intermittant Negative for: Dementia; Quadriplegia; Paraplegia; Seizure Disorder Oncologic Medical History: Negative for: Received Chemotherapy; Received Radiation Psychiatric Medical History: Negative for: Anorexia/bulimia; Confinement Anxiety Immunizations Pneumococcal Vaccine: Received Pneumococcal Vaccination: No Implantable Devices None Hospitalization / Surgery History Type of Hospitalization/Surgery c section x2 left breast lumpectomy iandD right ankle with theraskin Family and Social History Cancer: No; Diabetes: Yes - Mother; Heart Disease: No; Hereditary Spherocytosis: No; Hypertension: No; Kidney Disease: No; Lung Disease: Yes - Mother; Seizures: No; Stroke: No; Thyroid Problems: No; Tuberculosis: No; Never smoker; Marital Status - Married; Alcohol Use: Never; Drug Use: No History; Caffeine Use: Daily; Financial Concerns: No; Food, Clothing or Shelter Needs: No; Support System Lacking: No; Transportation Concerns: No Electronic Signature(s) Signed: 09/27/2022 10:34:50 AM By: Duanne Guess MD FACS Entered By: Duanne Guess on 09/27/2022 10:17:14 -------------------------------------------------------------------------------- SuperBill Details Patient Name: Date of Service: [name] URO UMA, [name] 09/27/2022 Medical  Record Number: [id number] Patient Account Number: [id number] Date of Birth/Sex:  Treating RN: 07-11-1970 (52 y.o. [name] Primary Care Provider: Julianne Handler Other Clinician: Referring Provider: Treating Provider/Extender: Koleen Nimrod Weeks in Treatment: 4 Diagnosis Coding ICD-10 Codes Code Description [phone] Non-pressure chronic ulcer of other part of right lower leg with other specified severity L97.822 Non-pressure chronic ulcer of other part of left lower leg with fat layer exposed 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 Facility Procedures : CPT4 Code: [id number] Description: [phone] - DEBRIDE WOUND 1ST 20 SQ CM OR < ICD-10 Diagnosis Description L97.818 Non-pressure chronic ulcer of other part of right lower leg with other specified s L97.822 Non-pressure chronic ulcer of other part of left lower leg with fat layer  exposed Modifier: everity Quantity: 1 : [place] CPT4 Code: [id number] , Beverlee ([id number] Description: 84166 - DEBRIDE WOUND EA ADDL 20 SQ CM ICD-10 Diagnosis Description L97.822 Non-pressure chronic ulcer of other part of left lower leg with fat layer exposed L97.818 Non-pressure chronic ulcer of other part of right lower leg with other  specified s 4) [phone] Modifier: everity [phone] Quantity: 1 .pdf Page 19 of 19 Physician Procedures : CPT4 Code Description Modifier [id number] - WC PHYS LEVEL 4 - EST PT 25 ICD-10 Diagnosis Description L97.818 Non-pressure chronic ulcer of other part of right lower leg with other specified severity L97.822 Non-pressure chronic ulcer of other part  of left lower leg with fat layer exposed 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 Quantity: 1 : [id number] - WC PHYS DEBR WO ANESTH 20 SQ CM ICD-10 Diagnosis Description 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

## 2022-09-27 NOTE — Telephone Encounter (Signed)
Oxycodone and ibuprofen refill request  

## 2022-09-28 LAB — OXYCODONE/OXYMORPHONE, CONFIRM
OXYCODONE/OXYMORPH: POSITIVE — AB
OXYCODONE: 1297 ng/mL
OXYCODONE: POSITIVE — AB
OXYMORPHONE (GC/MS): 2005 ng/mL
OXYMORPHONE: POSITIVE — AB

## 2022-09-28 LAB — DRUG SCREEN 764883 11+OXYCO+ALC+CRT-BUND
Amphetamines, Urine: NEGATIVE ng/mL
BENZODIAZ UR QL: NEGATIVE ng/mL
Barbiturate: NEGATIVE ng/mL
Cannabinoid Quant, Ur: NEGATIVE ng/mL
Cocaine (Metabolite): NEGATIVE ng/mL
Creatinine: 50.4 mg/dL (ref 20.0–300.0)
Ethanol: NEGATIVE %
Meperidine: NEGATIVE ng/mL
Methadone Screen, Urine: NEGATIVE ng/mL
Phencyclidine: NEGATIVE ng/mL
pH, Urine: 5.7 (ref 4.5–8.9)

## 2022-09-28 LAB — OPIATES CONFIRMATION, URINE: Opiates: NEGATIVE ng/mL

## 2022-10-03 ENCOUNTER — Other Ambulatory Visit: Payer: Self-pay | Admitting: Family Medicine

## 2022-10-03 DIAGNOSIS — D571 Sickle-cell disease without crisis: Secondary | ICD-10-CM

## 2022-10-07 ENCOUNTER — Non-Acute Institutional Stay (HOSPITAL_COMMUNITY)
Admission: RE | Admit: 2022-10-07 | Discharge: 2022-10-07 | Disposition: A | Discharge status: Home / Self Care | Payer: Medicaid Other | Source: Ambulatory Visit | Attending: Internal Medicine | Admitting: Internal Medicine

## 2022-10-07 ENCOUNTER — Other Ambulatory Visit: Payer: Self-pay | Admitting: Family Medicine

## 2022-10-07 DIAGNOSIS — D571 Sickle-cell disease without crisis: Secondary | ICD-10-CM

## 2022-10-07 DIAGNOSIS — D5700 Hb-SS disease with crisis, unspecified: Secondary | ICD-10-CM | POA: Diagnosis present

## 2022-10-07 LAB — COMPREHENSIVE METABOLIC PANEL
ALT: 31 U/L (ref 0–44)
AST: 53 U/L — ABNORMAL HIGH (ref 15–41)
Albumin: 4.1 g/dL (ref 3.5–5.0)
Alkaline Phosphatase: 66 U/L (ref 38–126)
Anion gap: 6 (ref 5–15)
BUN: 11 mg/dL (ref 6–20)
CO2: 22 mmol/L (ref 22–32)
Calcium: 8.4 mg/dL — ABNORMAL LOW (ref 8.9–10.3)
Chloride: 110 mmol/L (ref 98–111)
Creatinine, Ser: 0.54 mg/dL (ref 0.44–1.00)
GFR, Estimated: >60 mL/min (ref >60)
Glucose, Bld: 93 mg/dL (ref 70–99)
Potassium: 4 mmol/L (ref 3.5–5.1)
Sodium: 138 mmol/L (ref 135–145)
Total Bilirubin: 2.3 mg/dL — ABNORMAL HIGH (ref 0.3–1.2)
Total Protein: 7.9 g/dL (ref 6.5–8.1)

## 2022-10-07 LAB — CBC WITH DIFFERENTIAL/PLATELET
Abs Immature Granulocytes: 0.06 10*3/uL (ref 0.00–0.07)
Basophils Absolute: 0.1 10*3/uL (ref 0.0–0.1)
Basophils Relative: 1 %
Eosinophils Absolute: 0.7 10*3/uL — ABNORMAL HIGH (ref 0.0–0.5)
Eosinophils Relative: 6 %
HCT: 18.1 % — ABNORMAL LOW (ref 36.0–46.0)
Hemoglobin: 6.3 g/dL — CL (ref 12.0–15.0)
Immature Granulocytes: 1 %
Lymphocytes Relative: 14 %
Lymphs Abs: 1.7 10*3/uL (ref 0.7–4.0)
MCH: 29.3 pg (ref 26.0–34.0)
MCHC: 34.8 g/dL (ref 30.0–36.0)
MCV: 84.2 fL (ref 80.0–100.0)
Monocytes Absolute: 1.4 10*3/uL — ABNORMAL HIGH (ref 0.1–1.0)
Monocytes Relative: 12 %
Neutro Abs: 7.8 10*3/uL — ABNORMAL HIGH (ref 1.7–7.7)
Neutrophils Relative %: 66 %
Platelets: 436 10*3/uL — ABNORMAL HIGH (ref 150–400)
RBC: 2.15 MIL/uL — ABNORMAL LOW (ref 3.87–5.11)
RDW: 25.3 % — ABNORMAL HIGH (ref 11.5–15.5)
WBC: 11.8 10*3/uL — ABNORMAL HIGH (ref 4.0–10.5)
nRBC: 8.1 % — ABNORMAL HIGH (ref 0.0–0.2)

## 2022-10-07 LAB — RETICULOCYTES
Immature Retic Fract: 29.9 % — ABNORMAL HIGH (ref 2.3–15.9)
RBC.: 2.14 MIL/uL — ABNORMAL LOW (ref 3.87–5.11)
Retic Count, Absolute: 483.6 10*3/uL — ABNORMAL HIGH (ref 19.0–186.0)
Retic Ct Pct: 22.6 % — ABNORMAL HIGH (ref 0.4–3.1)

## 2022-10-07 NOTE — Progress Notes (Signed)
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 Patient tolerated well. Blue blood bank band placed on patient's wrist. Patient scheduled to come back tomorrow for possible blood transfusion and patient advised to keep blood band on wrist. Patient alert, oriented and ambulatory at discharge.

## 2022-10-07 NOTE — Progress Notes (Signed)
Critical Value: Hgb 6.3  Time and Date notified: 10/07/22 @ 0933  Provider notified: [place] Hollis, FNP  Action Taken: Pt is to return to clinic tomorrow for 2 Units PRBCS

## 2022-10-08 ENCOUNTER — Non-Acute Institutional Stay (HOSPITAL_COMMUNITY)
Admission: RE | Admit: 2022-10-08 | Discharge: 2022-10-08 | Disposition: A | Discharge status: Home / Self Care | Payer: Medicaid Other | Source: Ambulatory Visit | Attending: Internal Medicine | Admitting: Internal Medicine

## 2022-10-08 DIAGNOSIS — D571 Sickle-cell disease without crisis: Secondary | ICD-10-CM | POA: Insufficient documentation

## 2022-10-08 LAB — TYPE AND SCREEN

## 2022-10-08 LAB — PREPARE RBC (CROSSMATCH)

## 2022-10-08 LAB — BPAM RBC: ISSUE DATE / TIME: 202405210946

## 2022-10-08 MED ORDER — SODIUM CHLORIDE 0.9% IV SOLUTION: Freq: Once | INTRAVENOUS | Status: AC

## 2022-10-08 MED ORDER — DIPHENHYDRAMINE HCL 50 MG/ML IJ SOLN
25.0000 mg | Freq: Once | INTRAMUSCULAR | Status: AC
  Administered 2022-10-08: 25 mg via INTRAVENOUS
  Filled 2022-10-08: qty 1

## 2022-10-08 MED ORDER — ACETAMINOPHEN 325 MG PO TABS
650.0000 mg | ORAL_TABLET | Freq: Once | ORAL | Status: AC
  Administered 2022-10-08: 650 mg via ORAL
  Filled 2022-10-08: qty 2

## 2022-10-08 NOTE — Progress Notes (Signed)
PATIENT CARE CENTER NOTE   Diagnosis:  Hb-SS disease without crisis [facility]) [D57.1]    Provider: Hollis, [place], FNP   Procedure: Blood transfusion    Note: Patient received transfusion of 2 units PRBC's via PIV. Patient pre-medicated with Tylenol and IV Benadryl per order. Patient tolerated transfusions well with no adverse reaction. Vital signs stable. AVS offered but patient refused. Patient alert, oriented and ambulatory at discharge.

## 2022-10-09 ENCOUNTER — Encounter (HOSPITAL_BASED_OUTPATIENT_CLINIC_OR_DEPARTMENT_OTHER): Payer: Medicaid Other | Admitting: General Surgery

## 2022-10-09 DIAGNOSIS — L97818 Non-pressure chronic ulcer of other part of right lower leg with other specified severity: Secondary | ICD-10-CM | POA: Diagnosis not present

## 2022-10-09 LAB — TYPE AND SCREEN
ABO/RH(D): B POS
Antibody Screen: NEGATIVE
Unit division: 0
Unit division: 0

## 2022-10-09 LAB — BPAM RBC
Blood Product Expiration Date: 202406092359
Blood Product Expiration Date: 202406172359
ISSUE DATE / TIME: 202405210946
Unit Type and Rh: 9500
Unit Type and Rh: 9500

## 2022-10-17 LAB — POCT INFLUENZA A/B
Influenza A, POC: NEGATIVE
Influenza B, POC: NEGATIVE

## 2022-10-17 LAB — POC COVID19 BINAXNOW: SARS Coronavirus 2 Ag: NEGATIVE

## 2022-10-21 ENCOUNTER — Telehealth: Payer: Self-pay | Admitting: Family Medicine

## 2022-10-21 ENCOUNTER — Other Ambulatory Visit: Payer: Self-pay | Admitting: Family Medicine

## 2022-10-21 DIAGNOSIS — G894 Chronic pain syndrome: Secondary | ICD-10-CM

## 2022-10-21 MED ORDER — OXYCODONE HCL 10 MG PO TABS
10.0000 mg | ORAL_TABLET | ORAL | 0 refills | Status: DC
Start: 2022-10-21 — End: 2022-10-23

## 2022-10-21 NOTE — Progress Notes (Signed)
Reviewed PDMP substance reporting system prior to prescribing opiate medications. No inconsistencies noted.    Meds ordered this encounter  Medications   Oxycodone HCl 10 MG TABS    Sig: Take 1 tablet (10 mg total) by mouth every 4 (four) hours while awake.    Dispense:  90 tablet    Refill:  0    Order Specific Question:   Supervising Provider    Answer:   JEGEDE, OLUGBEMIGA E [[id number]]    Eagan Shifflett Moore Lizzett Nobile  APRN, MSN, FNP-C [facility] [address]  [place], [place] [address] [phone]  

## 2022-10-21 NOTE — Telephone Encounter (Signed)
Caller & Relationship to patient:  MRN #  [id number]   Call Back Number:   Date of Last Office Visit: 09/27/2022     Date of Next Office Visit: 12/24/2022    Medication(s) to be Refilled: Oxycodone 10 mg  Preferred Pharmacy:   ** Please notify patient to allow 48-72 hours to process** **Let patient know to contact pharmacy at the end of the day to make sure medication is ready. ** **If patient has not been seen in a year or longer, book an appointment **Advise to use MyChart for refill requests OR to contact their pharmacy

## 2022-10-23 ENCOUNTER — Other Ambulatory Visit: Payer: Self-pay | Admitting: Family Medicine

## 2022-10-23 ENCOUNTER — Telehealth: Payer: Self-pay | Admitting: Family Medicine

## 2022-10-23 ENCOUNTER — Encounter (HOSPITAL_BASED_OUTPATIENT_CLINIC_OR_DEPARTMENT_OTHER): Payer: Medicaid Other | Attending: General Surgery | Admitting: General Surgery

## 2022-10-23 DIAGNOSIS — I82531 Chronic embolism and thrombosis of right popliteal vein: Secondary | ICD-10-CM | POA: Diagnosis not present

## 2022-10-23 DIAGNOSIS — D571 Sickle-cell disease without crisis: Secondary | ICD-10-CM | POA: Insufficient documentation

## 2022-10-23 DIAGNOSIS — L97828 Non-pressure chronic ulcer of other part of left lower leg with other specified severity: Secondary | ICD-10-CM | POA: Insufficient documentation

## 2022-10-23 DIAGNOSIS — L97818 Non-pressure chronic ulcer of other part of right lower leg with other specified severity: Secondary | ICD-10-CM | POA: Diagnosis present

## 2022-10-23 DIAGNOSIS — I872 Venous insufficiency (chronic) (peripheral): Secondary | ICD-10-CM | POA: Insufficient documentation

## 2022-10-23 DIAGNOSIS — Z833 Family history of diabetes mellitus: Secondary | ICD-10-CM | POA: Insufficient documentation

## 2022-10-23 DIAGNOSIS — G894 Chronic pain syndrome: Secondary | ICD-10-CM

## 2022-10-23 MED ORDER — OXYCODONE HCL 10 MG PO TABS
10.00 mg | ORAL_TABLET | ORAL | 0 refills | Status: DC
Start: 2022-10-23 — End: 2022-11-26

## 2022-10-23 NOTE — Progress Notes (Signed)
Reviewed PDMP substance reporting system prior to prescribing opiate medications. No inconsistencies noted.    Meds ordered this encounter  Medications   Oxycodone HCl 10 MG TABS    Sig: Take 1 tablet (10 mg total) by mouth every 4 (four) hours while awake.    Dispense:  90 tablet    Refill:  0    Order Specific Question:   Supervising Provider    Answer:   JEGEDE, OLUGBEMIGA E [[id number]]    Becci Batty Moore Koston Hennes  APRN, MSN, FNP-C [facility] [address]  [place], [place] [address] [phone]  

## 2022-10-23 NOTE — Telephone Encounter (Signed)
Caller & Relationship to patient:  MRN #  [id number]   Call Back Number:   Date of Last Office Visit: 10/21/2022     Date of Next Office Visit: 12/24/2022    Medication(s) to be Refilled: Oxycodone   Preferred Pharmacy:   ** Please notify patient to allow 48-72 hours to process** **Let patient know to contact pharmacy at the end of the day to make sure medication is ready. ** **If patient has not been seen in a year or longer, book an appointment **Advise to use MyChart for refill requests OR to contact their pharmacy

## 2022-10-28 ENCOUNTER — Non-Acute Institutional Stay (HOSPITAL_COMMUNITY)
Admission: RE | Admit: 2022-10-28 | Discharge: 2022-10-28 | Disposition: A | Discharge status: Home / Self Care | Payer: Medicaid Other | Source: Ambulatory Visit | Attending: Internal Medicine | Admitting: Internal Medicine

## 2022-10-28 DIAGNOSIS — D5700 Hb-SS disease with crisis, unspecified: Secondary | ICD-10-CM | POA: Insufficient documentation

## 2022-10-28 LAB — CBC WITH DIFFERENTIAL/PLATELET
Abs Immature Granulocytes: 0.03 10*3/uL (ref 0.00–0.07)
Basophils Absolute: 0.2 10*3/uL — ABNORMAL HIGH (ref 0.0–0.1)
Basophils Relative: 1 %
Eosinophils Absolute: 1 10*3/uL — ABNORMAL HIGH (ref 0.0–0.5)
Eosinophils Relative: 8 %
HCT: 23.3 % — ABNORMAL LOW (ref 36.0–46.0)
Hemoglobin: 8.1 g/dL — ABNORMAL LOW (ref 12.0–15.0)
Immature Granulocytes: 0 %
Lymphocytes Relative: 15 %
Lymphs Abs: 1.8 10*3/uL (ref 0.7–4.0)
MCH: 30.5 pg (ref 26.0–34.0)
MCHC: 34.8 g/dL (ref 30.0–36.0)
MCV: 87.6 fL (ref 80.0–100.0)
Monocytes Absolute: 1.2 10*3/uL — ABNORMAL HIGH (ref 0.1–1.0)
Monocytes Relative: 10 %
Neutro Abs: 8.3 10*3/uL — ABNORMAL HIGH (ref 1.7–7.7)
Neutrophils Relative %: 66 %
Platelets: 493 10*3/uL — ABNORMAL HIGH (ref 150–400)
RBC: 2.66 MIL/uL — ABNORMAL LOW (ref 3.87–5.11)
RDW: 19.2 % — ABNORMAL HIGH (ref 11.5–15.5)
WBC: 12.6 10*3/uL — ABNORMAL HIGH (ref 4.0–10.5)
nRBC: 1 % — ABNORMAL HIGH (ref 0.0–0.2)

## 2022-10-28 LAB — COMPREHENSIVE METABOLIC PANEL
ALT: 34 U/L (ref 0–44)
AST: 47 U/L — ABNORMAL HIGH (ref 15–41)
Albumin: 4 g/dL (ref 3.5–5.0)
Alkaline Phosphatase: 61 U/L (ref 38–126)
Anion gap: 6 (ref 5–15)
BUN: 11 mg/dL (ref 6–20)
CO2: 23 mmol/L (ref 22–32)
Calcium: 8.7 mg/dL — ABNORMAL LOW (ref 8.9–10.3)
Chloride: 108 mmol/L (ref 98–111)
Creatinine, Ser: 0.5 mg/dL (ref 0.44–1.00)
GFR, Estimated: >60 mL/min (ref >60)
Glucose, Bld: 89 mg/dL (ref 70–99)
Potassium: 4.2 mmol/L (ref 3.5–5.1)
Sodium: 137 mmol/L (ref 135–145)
Total Bilirubin: 1.8 mg/dL — ABNORMAL HIGH (ref 0.3–1.2)
Total Protein: 7.7 g/dL (ref 6.5–8.1)

## 2022-10-28 LAB — RETICULOCYTES
Immature Retic Fract: 22 % — ABNORMAL HIGH (ref 2.3–15.9)
RBC.: 2.58 MIL/uL — ABNORMAL LOW (ref 3.87–5.11)
Retic Count, Absolute: 329 10*3/uL — ABNORMAL HIGH (ref 19.0–186.0)
Retic Ct Pct: 12.2 % — ABNORMAL HIGH (ref 0.4–3.1)

## 2022-10-28 LAB — FERRITIN: Ferritin: 953 ng/mL — ABNORMAL HIGH (ref 11–307)

## 2022-10-28 LAB — TYPE AND SCREEN: Antibody Screen: NEGATIVE

## 2022-10-28 LAB — LACTATE DEHYDROGENASE: LDH: 309 U/L — ABNORMAL HIGH (ref 98–192)

## 2022-10-28 NOTE — Progress Notes (Signed)
PATIENT CARE CENTER NOTE     Diagnosis: Sickle-cell disease with pain [facility])      Provider: Hollis, [place], FNP     Procedure: Lab draw (Type & Screen, CBC w/diff, CMP, Retic, LDH)     Note: Patient's labs drawn peripherally. Patient tolerated well. Blue blood bank band placed on patient's wrist. Patient will be notified if she needs to come back for blood transfusion once labs result. Patient alert, oriented and ambulatory at discharge.

## 2022-10-29 LAB — TYPE AND SCREEN: ABO/RH(D): B POS

## 2022-10-29 NOTE — Progress Notes (Signed)
JOLIET, DANGEL ([id number]) [id number] Page 1 of 9 Visit Report for 09/27/2022 Arrival Information Details Patient Name: Date of Service: [name] [name] 09/27/2022 9:00 A M Medical Record Number: [id number] Patient Account Number: [id number] Date of Birth/Sex: Treating RN: 12-18-1970 (52 y.o. F) Primary Care Onesti Bonfiglio: Julianne Handler Other Clinician: Referring Lashawne Dura: Treating Torrin Frein/Extender: Koleen Nimrod Weeks in Treatment: 52 Visit Information History Since Last Visit All ordered tests and consults were completed: No Patient Arrived: Ambulatory Added or deleted any medications: No Arrival Time: 09:09 Any new allergies or adverse reactions: No Accompanied By: self Had a fall or experienced change in No Transfer Assistance: None activities of daily living that may affect Patient Identification Verified: Yes risk of falls: Secondary Verification Process Completed: Yes Signs or symptoms of abuse/neglect since last visito No Patient Requires Transmission-Based Precautions: No Hospitalized since last visit: No Patient Has Alerts: No Implantable device outside of the clinic excluding No cellular tissue based products placed in the center since last visit: Has Dressing in Place as Prescribed: Yes Pain Present Now: Yes Electronic Signature(s) Signed: 09/27/2022 4:28:56 PM By: Zenaida Deed RN, BSN Entered By: Zenaida Deed on 09/27/2022 09:32:43 -------------------------------------------------------------------------------- Encounter Discharge Information Details Patient Name: Date of Service: [name], [name] 09/27/2022 9:00 A M Medical Record Number: [id number] Patient Account Number: [id number] Date of Birth/Sex: Treating RN: 25-Nov-1970 (52 y.o. [name] Primary Care Madonna Flegal: Julianne Handler Other Clinician: Referring Maleek Craver: Treating Lariya Kinzie/Extender: Koleen Nimrod Weeks in Treatment:  36 Encounter Discharge Information Items Post Procedure Vitals Discharge Condition: Stable Temperature (F): 98.1 Ambulatory Status: Ambulatory Pulse (bpm): 78 Discharge Destination: Home Respiratory Rate (breaths/min): 18 Transportation: Private Auto Blood Pressure (mmHg): 118/72 Accompanied By: self Schedule Follow-up Appointment: Yes Clinical Summary of Care: Patient Declined Electronic Signature(s) Signed: 10/29/2022 7:49:05 AM By: Brenton Grills Entered By: Brenton Grills on 09/27/2022 10:27:08 -------------------------------------------------------------------------------- Lower Extremity Assessment Details Patient Name: Date of Service: [name], [name] 09/27/2022 9:00 A M Medical Record Number: [id number] Patient Account Number: [id number] Date of Birth/Sex: Treating RN: 1971/05/07 (52 y.o. [name] Primary Care Krosby Ritchie: Julianne Handler Other Clinician: Referring Johnnell Liou: Treating Gatsby Chismar/Extender: Koleen Nimrod Weeks in Treatment: [address] Edema Assessment K[Left: MATIA, HERMOSILLO ([id number])] Franne Forts: [id number] Page 2 of 9] Assessed: [Left: No] [Right: No] Edema: [Left: No] [Right: No] Calf Left: Right: Point of Measurement: 33 cm From Medial Instep 26 cm 29 cm Ankle Left: Right: Point of Measurement: 10 cm From Medial Instep 17 cm 19.4 cm Vascular Assessment Pulses: Dorsalis Pedis Palpable: [Left:Yes] [Right:Yes] Electronic Signature(s) Signed: 09/27/2022 4:28:56 PM By: Zenaida Deed RN, BSN Entered By: Zenaida Deed on 09/27/2022 09:34:57 -------------------------------------------------------------------------------- Multi Wound Chart Details Patient Name: Date of Service: [name], [name] 09/27/2022 9:00 A M Medical Record Number: [id number] Patient Account Number: [id number] Date of Birth/Sex: Treating RN: Jun 21, 1970 (52 y.o. F) Primary Care Renn Dirocco: Julianne Handler Other Clinician: Referring  Shannah Conteh: Treating Jackquelyn Sundberg/Extender: Koleen Nimrod Weeks in Treatment: 7 Vital Signs Height(in): 67 Pulse(bpm): 82 Weight(lbs): 134 Blood Pressure(mmHg): 117/67 Body Mass Index(BMI): 21 Temperature(F): 98.6 Respiratory Rate(breaths/min): 20 [17:Photos: No Photos Right, Lateral Lower Leg Wound Location: Gradually Appeared Wounding Event: Sickle Cell Lesion Primary Etiology: N/A Secondary Etiology: Anemia, Sickle Cell Disease, Comorbid History: Neuropathy 10/05/2012 Date Acquired: 96 Weeks of Treatment:  Open Wound Status: No Wound Recurrence: Yes Clustered Wound: 7.3x6x0.1 Measurements L x W x D (cm) 34.4 A (cm) : rea 3.44 Volume (cm) : 81.70% % Reduction in Area:  81.70% % Reduction in Volume: Full Thickness Without Exposed Classification: Support  Structures Medium Exudate A mount: Purulent Exudate Type: yellow, brown, green Exudate Color: Distinct, outline attached Wound Margin: Small (1-33%) Granulation Amount: Pink Granulation Quality: Large (67-100%) Necrotic Amount: Adherent Slough Necrotic  Tissue: Fat Layer (Subcutaneous Tissue): Yes Fat Layer (Subcutaneous Tissue): Yes Fat Layer (Subcutaneous Tissue): Yes Exposed Structures: Fascia: No Tendon: No Muscle: No Joint: No Bone: No Small (1-33%) Epithelialization:] [21:No Photos Right, Medial  Ankle Gradually Appeared Sickle Cell Lesion Venous Leg Ulcer Anemia, Sickle Cell Disease, Neuropathy 06/26/2021 65 Open No No 7.5x3.7x0.1 21.795 2.179 24.80% 24.80% Full Thickness Without Exposed Support Structures Medium Purulent yellow, brown, green  Distinct, outline attached Small (1-33%) Pink Large (67-100%) Adherent Slough Fascia: No Tendon: No Muscle: No Joint: No Bone: No Small (1-33%)] [24:No Photos Left, Lateral Ankle Gradually Appeared Sickle Cell Lesion N/A Anemia, Sickle Cell Disease,  Neuropathy 08/06/2022 7 Open No No 0.2x0.2x0.1 0.031 0.003 97.60% 97.70% Full Thickness Without Exposed Support Structures Small  Serosanguineous red, brown Distinct, outline attached Large (67-100%) Pink Small (1-33%) Eschar, Adherent Slough Fascia: No  Tendon: No Muscle: No Joint: No Bone: No Large (67-100%)] TANNY, MITCHELTREE ([id number]) [17:Debridement - Selective/Open Wound Debridement - Selective/Open Wound Debridement - Selective/Open Wound Debridement: 10:00 Pre-procedure Verification/Time Out Taken: Lidocaine 4% Topical Solution Pain Control: Slough Tissue Debrided: Non-Viable  Tissue Level: 3.44 Debridement A (sq cm): rea Curette Instrument: Minimum Bleeding: Pressure Hemostasis A chieved: 0 Procedural Pain: 3 Post Procedural Pain: Procedure was tolerated well Debridement Treatment Response: 7.3x0.6x0.1 Post Debridement  Measurements L x W x D (cm) 0.344 Post Debridement Volume: (cm) Scarring: Yes Periwound Skin Texture: No Abnormalities Noted Periwound Skin Moisture: Hemosiderin Staining: Yes Periwound Skin Color: Ecchymosis: No No Abnormality Temperature: Yes  Tenderness on Palpation: Debridement Procedures Performed:] [21:10:00 Lidocaine 4% Topical Solution Slough Non-Viable Tissue 21.78 Curette Minimum Pressure 0 3 Procedure was tolerated well 7.5x3.7x0.1 2.179 Scarring: Yes No Abnormalities Noted No  Abnormalities Noted No Abnormality Yes Debridement] [[id number] Page 3 of 9 10:00 Lidocaine 4% Topical Solution Slough Non-Viable Tissue 0.03 Curette Minimum Pressure 0 3 Procedure was tolerated well 0.2x0.2x0.1 0.003 No  Abnormalities Noted No Abnormalities Noted No Abnormalities Noted No Abnormality Yes Debridement] Wound Number: 25 N/A N/A Photos: N/A N/A Right, Dorsal Foot N/A N/A Wound Location: Gradually Appeared N/A N/A Wounding Event: Sickle Cell Lesion N/A N/A Primary Etiology: N/A N/A N/A Secondary Etiology: Anemia, Sickle Cell Disease, N/A N/A Comorbid History: Neuropathy 08/30/2022 N/A N/A Date Acquired: 0 N/A N/A Weeks of Treatment: Open N/A N/A Wound Status: No N/A  N/A Wound Recurrence: No N/A N/A Clustered Wound: 2.3x0.7x0.1 N/A N/A Measurements L x W x D (cm) 1.264 N/A N/A A (cm) : rea 0.126 N/A N/A Volume (cm) : N/A N/A N/A % Reduction in A rea: N/A N/A N/A % Reduction in Volume: Full Thickness Without Exposed N/A N/A Classification: Support Structures Medium N/A N/A Exudate A mount: Serosanguineous N/A N/A Exudate Type: red, brown N/A N/A Exudate Color: Flat and Intact N/A N/A Wound Margin: None Present (0%) N/A N/A Granulation A mount: N/A N/A N/A Granulation Quality: Large (67-100%) N/A N/A Necrotic A mount: Eschar, Adherent Slough N/A N/A Necrotic Tissue: Fat Layer (Subcutaneous Tissue): Yes N/A N/A Exposed Structures: Fascia: No Tendon: No Muscle: No Joint: No Bone: No Small (1-33%) N/A N/A Epithelialization: Debridement - Selective/Open Wound N/A N/A Debridement: Pre-procedure Verification/Time Out 10:00 N/A N/A Taken: Lidocaine 4% Topical Solution N/A N/A Pain Control: Slough N/A N/A Tissue Debrided: Non-Viable  Tissue N/A N/A Level: 1.26 N/A N/A Debridement A (sq cm): rea Curette N/A N/A Instrument: Minimum N/A N/A Bleeding: Pressure N/A N/A Hemostasis A chieved: 0 N/A N/A Procedural Pain: 3 N/A N/A Post Procedural Pain: Procedure was tolerated well N/A N/A Debridement Treatment Response: 2.3x0.7x0.1 N/A N/A Post Debridement Measurements L x W x D (cm) 0.126 N/A N/A Post Debridement Volume: (cm) Scarring: Yes N/A N/A Periwound Skin TextureThelma Barge ([id number]) [id number] Page 4 of 9 No Abnormalities Noted N/A N/A Periwound Skin Moisture: No Abnormalities Noted N/A N/A Periwound Skin Color: No Abnormality N/A N/A Temperature: Yes N/A N/A Tenderness on Palpation: Debridement N/A N/A Procedures Performed: Treatment Notes Electronic Signature(s) Signed: 09/27/2022 10:13:39 AM By: Duanne Guess MD FACS Previous Signature: 09/27/2022 9:40:15 AM  Version By: Duanne Guess MD FACS Entered By: Duanne Guess on 09/27/2022 10:13:38 -------------------------------------------------------------------------------- Multi-Disciplinary Care Plan Details Patient Name: Date of Service: [name], [name] 09/27/2022 9:00 A M Medical Record Number: [id number] Patient Account Number: [id number] Date of Birth/Sex: Treating RN: Jan 25, 1971 (52 y.o. [name] Primary Care Henok Heacock: Julianne Handler Other Clinician: Referring Vrinda Heckstall: Treating Ricahrd Schwager/Extender: Koleen Nimrod Weeks in Treatment: 99 Multidisciplinary Care Plan reviewed with physician Active Inactive Venous Leg Ulcer Nursing Diagnoses: Actual venous Insuffiency (use after diagnosis is confirmed) Knowledge deficit related to disease process and management Goals: Patient will maintain optimal edema control Date Initiated: 06/26/2021 Target Resolution Date: 10/18/2022 Goal Status: Active Interventions: Assess peripheral edema status every visit. Compression as ordered Treatment Activities: Therapeutic compression applied : 06/26/2021 Notes: Wound/Skin Impairment Nursing Diagnoses: Impaired tissue integrity Goals: Patient/caregiver will verbalize understanding of skin care regimen Date Initiated: 04/17/2021 Target Resolution Date: 10/18/2022 Goal Status: Active Ulcer/skin breakdown will have a volume reduction of 30% by week 4 Date Initiated: 04/17/2021 Date Inactivated: 05/29/2021 Target Resolution Date: 05/15/2021 Goal Status: Met Ulcer/skin breakdown will have a volume reduction of 50% by week 8 Date Initiated: 05/29/2021 Date Inactivated: 06/26/2021 Target Resolution Date: 06/26/2021 Goal Status: Unmet Unmet Reason: venous reflux Interventions: Assess patient/caregiver ability to obtain necessary supplies Assess patient/caregiver ability to perform ulcer/skin care regimen upon admission and as needed Assess ulceration(s) every visit Provide  education on ulcer and skin care Treatment Activities: Topical wound management initiated : 04/17/2021 [name] ([id number]) [id number] Page 5 of 9 Notes: 06/08/21: Left leg wounds greater than 30% volume reduction, right leg acute infection. Electronic Signature(s) Signed: 10/29/2022 7:49:05 AM By: Brenton Grills Entered By: Brenton Grills on 09/27/2022 09:54:37 -------------------------------------------------------------------------------- Pain Assessment Details Patient Name: Date of Service: Doneen Poisson, [place] [name] 09/27/2022 9:00 A M Medical Record Number: [id number] Patient Account Number: [id number] Date of Birth/Sex: Treating RN: [birth year] (52 y.o. F) Primary Care Mansel Strother: Julianne Handler Other Clinician: Referring Dannah Ryles: Treating Sibley Rolison/Extender: Koleen Nimrod Weeks in Treatment: 47 Active Problems Location of Pain Severity and Description of Pain Patient Has Paino Yes Site Locations Pain Location: Pain in Ulcers With Dressing Change: Yes Duration of the Pain. Constant / Intermittento Intermittent Rate the pain. Current Pain Level: 5 Worst Pain Level: 10 Least Pain Level: 0 Tolerable Pain Level: 2 Character of Pain Describe the Pain: Burning Pain Management and Medication Current Pain Management: Rest: Yes How does your wound impact your activities of daily livingo Sleep: No Bathing: No Appetite: No Relationship With Others: No Bladder Continence: No Emotions: No Bowel Continence: No Work: Yes Toileting: No Drive: No Dressing: No Hobbies: Manufacturing systems engineer) Signed: 09/27/2022 4:28:56 PM By: Zenaida Deed RN, BSN Entered By: Zenaida Deed on  09/27/2022 09:33:52 -------------------------------------------------------------------------------- Patient/Caregiver Education Details Patient Name: Date of Service: [name], [place] [name] 5/10/2024andnbsp9:00 A M Medical Record Number:  [id number] Patient Account Number: [id number] Date of Birth/Gender: Treating RN: [birth year] (52 y.o. [name] Primary Care Physician: Julianne Handler Other Clinician: Referring Physician: Treating Physician/Extender: Koleen Nimrod Weeks in Treatment: [address] [place] ([id number]) [id number] Page 6 of 9 Education Assessment Education Provided To: Patient Education Topics Provided Wound/Skin Impairment: Methods: Explain/Verbal Responses: State content correctly Electronic Signature(s) Signed: 10/29/2022 7:49:05 AM By: Brenton Grills Entered By: Brenton Grills on 09/27/2022 10:09:23 -------------------------------------------------------------------------------- Wound Assessment Details Patient Name: Date of Service: [name], [name] 09/27/2022 9:00 A M Medical Record Number: [id number] Patient Account Number: [id number] Date of Birth/Sex: Treating RN: 1971/04/29 (52 y.o. [name] Primary Care Nikayla Madaris: Julianne Handler Other Clinician: Referring Kamarion Zagami: Treating Jerett Odonohue/Extender: Koleen Nimrod Weeks in Treatment: 18 Wound Status Wound Number: 17 Primary Etiology: Sickle Cell Lesion Wound Location: Right, Lateral Lower Leg Wound Status: Open Wounding Event: Gradually Appeared Comorbid History: Anemia, Sickle Cell Disease, Neuropathy Date Acquired: 10/05/2012 Weeks Of Treatment: 75 Clustered Wound: Yes Wound Measurements Length: (cm) 7.3 Width: (cm) 6 Depth: (cm) 0.1 Area: (cm) 34.4 Volume: (cm) 3.44 % Reduction in Area: 81.7% % Reduction in Volume: 81.7% Epithelialization: Small (1-33%) Tunneling: No Undermining: No Wound Description Classification: Full Thickness Without Exposed Support Structures Wound Margin: Distinct, outline attached Exudate Amount: Medium Exudate Type: Purulent Exudate Color: yellow, brown, green Foul Odor After Cleansing: No Slough/Fibrino Yes Wound  Bed Granulation Amount: Small (1-33%) Exposed Structure Granulation Quality: Pink Fascia Exposed: No Necrotic Amount: Large (67-100%) Fat Layer (Subcutaneous Tissue) Exposed: Yes Necrotic Quality: Adherent Slough Tendon Exposed: No Muscle Exposed: No Joint Exposed: No Bone Exposed: No Periwound Skin Texture Texture Color No Abnormalities Noted: No No Abnormalities Noted: No Scarring: Yes Ecchymosis: No Hemosiderin Staining: Yes Moisture No Abnormalities Noted: Yes Temperature / Pain Temperature: No Abnormality Tenderness on Palpation: Yes Electronic Signature(s) Signed: 09/27/2022 4:28:56 PM By: Zenaida Deed RN, BSN Entered By: Zenaida Deed on 09/27/2022 09:40:47 [name] ([id number]) [id number] Page 7 of 9 -------------------------------------------------------------------------------- Wound Assessment Details Patient Name: Date of Service: [name] [name] 09/27/2022 9:00 A M Medical Record Number: [id number] Patient Account Number: [id number] Date of Birth/Sex: Treating RN: [birth year] (52 y.o. [name] Primary Care Erasmo Vertz: Julianne Handler Other Clinician: Referring Jhony Antrim: Treating Brooks Stotz/Extender: Koleen Nimrod Weeks in Treatment: 61 Wound Status Wound Number: 21 Primary Etiology: Sickle Cell Lesion Wound Location: Right, Medial Ankle Secondary Etiology: Venous Leg Ulcer Wounding Event: Gradually Appeared Wound Status: Open Date Acquired: 06/26/2021 Comorbid History: Anemia, Sickle Cell Disease, Neuropathy Weeks Of Treatment: 65 Clustered Wound: No Wound Measurements Length: (cm) 7.5 Width: (cm) 3.7 Depth: (cm) 0.1 Area: (cm) 21.795 Volume: (cm) 2.179 % Reduction in Area: 24.8% % Reduction in Volume: 24.8% Epithelialization: Small (1-33%) Tunneling: No Undermining: No Wound Description Classification: Full Thickness Without Exposed Support Structures Wound Margin: Distinct, outline  attached Exudate Amount: Medium Exudate Type: Purulent Exudate Color: yellow, brown, green Foul Odor After Cleansing: No Slough/Fibrino Yes Wound Bed Granulation Amount: Small (1-33%) Exposed Structure Granulation Quality: Pink Fascia Exposed: No Necrotic Amount: Large (67-100%) Fat Layer (Subcutaneous Tissue) Exposed: Yes Necrotic Quality: Adherent Slough Tendon Exposed: No Muscle Exposed: No Joint Exposed: No Bone Exposed: No Periwound Skin Texture Texture Color No Abnormalities Noted: No No Abnormalities Noted: Yes Scarring: Yes Temperature / Pain Temperature: No Abnormality Moisture No Abnormalities Noted: Yes Tenderness on Palpation: Yes  Electronic Signature(s) Signed: 09/27/2022 4:28:56 PM By: Zenaida Deed RN, BSN Entered By: Zenaida Deed on 09/27/2022 09:41:19 -------------------------------------------------------------------------------- Wound Assessment Details Patient Name: Date of Service: [name], [name] 09/27/2022 9:00 A M Medical Record Number: [id number] Patient Account Number: [id number] Date of Birth/Sex: Treating RN: November 29, 1970 (52 y.o. [name] Primary Care Donnell Beauchamp: Julianne Handler Other Clinician: Referring Lakrisha Iseman: Treating Jeanenne Licea/Extender: Koleen Nimrod Weeks in Treatment: 58 Wound Status Wound Number: 24 Primary Etiology: Sickle Cell Lesion Wound Location: Left, Lateral Ankle Wound Status: Open Wounding Event: Gradually Appeared Comorbid History: Anemia, Sickle Cell Disease, Neuropathy Date Acquired: 08/06/2022 [facility] Of Treatment: 7 Shannon, Felecia Shelling ([id number]) [id number] Page 8 of 9 Clustered Wound: No Wound Measurements Length: (cm) 0.2 Width: (cm) 0.2 Depth: (cm) 0.1 Area: (cm) 0.031 Volume: (cm) 0.003 % Reduction in Area: 97.6% % Reduction in Volume: 97.7% Epithelialization: Large (67-100%) Tunneling: No Undermining: No Wound Description Classification: Full  Thickness Without Exposed Support Structures Wound Margin: Distinct, outline attached Exudate Amount: Small Exudate Type: Serosanguineous Exudate Color: red, brown Foul Odor After Cleansing: No Slough/Fibrino Yes Wound Bed Granulation Amount: Large (67-100%) Exposed Structure Granulation Quality: Pink Fascia Exposed: No Necrotic Amount: Small (1-33%) Fat Layer (Subcutaneous Tissue) Exposed: Yes Necrotic Quality: Eschar, Adherent Slough Tendon Exposed: No Muscle Exposed: No Joint Exposed: No Bone Exposed: No Periwound Skin Texture Texture Color No Abnormalities Noted: Yes No Abnormalities Noted: Yes Moisture Temperature / Pain No Abnormalities Noted: Yes Temperature: No Abnormality Tenderness on Palpation: Yes Electronic Signature(s) Signed: 09/27/2022 4:28:56 PM By: Zenaida Deed RN, BSN Entered By: Zenaida Deed on 09/27/2022 09:41:46 -------------------------------------------------------------------------------- Wound Assessment Details Patient Name: Date of Service: [name], [name] 09/27/2022 9:00 A M Medical Record Number: [id number] Patient Account Number: [id number] Date of Birth/Sex: Treating RN: [birth year] (52 y.o. [name] Primary Care Braven Wolk: Julianne Handler Other Clinician: Referring Acacia Latorre: Treating Ashani Pumphrey/Extender: Koleen Nimrod Weeks in Treatment: 89 Wound Status Wound Number: 25 Primary Etiology: Sickle Cell Lesion Wound Location: Right, Dorsal Foot Wound Status: Open Wounding Event: Gradually Appeared Comorbid History: Anemia, Sickle Cell Disease, Neuropathy Date Acquired: 08/30/2022 Weeks Of Treatment: 0 Clustered Wound: No Photos Wound Measurements Length: (cm) 2.3 Antonacci, [place] ([id number]) Width: (cm) 0.7 Depth: (cm) 0.1 Area: (cm) 1.264 Volume: (cm) 0.126 % Reduction in Area: [id number] Page 9 of 9 % Reduction in Volume: Epithelialization: Small (1-33%) Tunneling:  No Undermining: No Wound Description Classification: Full Thickness Without Exposed Support Structures Wound Margin: Flat and Intact Exudate Amount: Medium Exudate Type: Serosanguineous Exudate Color: red, brown Foul Odor After Cleansing: No Slough/Fibrino Yes Wound Bed Granulation Amount: None Present (0%) Exposed Structure Necrotic Amount: Large (67-100%) Fascia Exposed: No Necrotic Quality: Eschar, Adherent Slough Fat Layer (Subcutaneous Tissue) Exposed: Yes Tendon Exposed: No Muscle Exposed: No Joint Exposed: No Bone Exposed: No Periwound Skin Texture Texture Color No Abnormalities Noted: No No Abnormalities Noted: Yes Scarring: Yes Temperature / Pain Temperature: No Abnormality Moisture No Abnormalities Noted: Yes Tenderness on Palpation: Yes Electronic Signature(s) Signed: 09/27/2022 4:28:56 PM By: Zenaida Deed RN, BSN Entered By: Zenaida Deed on 09/27/2022 09:44:42 -------------------------------------------------------------------------------- Vitals Details Patient Name: Date of Service: [name], [name] 09/27/2022 9:00 A M Medical Record Number: [id number] Patient Account Number: [id number] Date of Birth/Sex: Treating RN: [birth year] (51 y.o. F) Primary Care Naidelin Gugliotta: Julianne Handler Other Clinician: Referring Lelynd Poer: Treating Ingrid Shifrin/Extender: Koleen Nimrod Weeks in Treatment: 38 Vital Signs Time Taken: 09:09 Temperature (F): 98.6 Height (in): 67 Pulse (bpm): 82 Weight (lbs): 134 Respiratory  Rate (breaths/min): 20 Body Mass Index (BMI): 21 Blood Pressure (mmHg): 117/67 Reference Range: 80 - 120 mg / dl Electronic Signature(s) Signed: 09/27/2022 4:28:56 PM By: Zenaida Deed RN, BSN Entered By: Zenaida Deed on 09/27/2022 09:32:49

## 2022-10-30 ENCOUNTER — Other Ambulatory Visit: Payer: Self-pay

## 2022-10-30 ENCOUNTER — Telehealth: Payer: Self-pay

## 2022-10-30 NOTE — Telephone Encounter (Signed)
A prior authorization for Oxycodone 10mg  was submitted and approved today.

## 2022-10-30 NOTE — Telephone Encounter (Signed)
Patient and pharmacy notified. Pharmacy was able to process a prescription with insurance for more than a 5 day supply.

## 2022-11-06 ENCOUNTER — Encounter (HOSPITAL_BASED_OUTPATIENT_CLINIC_OR_DEPARTMENT_OTHER): Payer: Medicaid Other | Admitting: General Surgery

## 2022-11-06 DIAGNOSIS — L97818 Non-pressure chronic ulcer of other part of right lower leg with other specified severity: Secondary | ICD-10-CM | POA: Diagnosis not present

## 2022-11-07 NOTE — Progress Notes (Signed)
[name], [name] ([id number]) [id number] Page 1 of 9 Visit Report for 11/06/2022 Arrival Information Details Patient Name: Date of Service: [name] [name] 11/06/2022 9:45 A M Medical Record Number: [id number] Patient Account Number: [id number] Date of Birth/Sex: Treating RN: 1970-08-04 (52 y.o. [name], [name] Primary Care [name]: [name] Other Clinician: Referring [name]: Treating [name]/Extender: [name] in Treatment: 25 Visit Information History Since Last Visit Added or deleted any medications: No Patient Arrived: Ambulatory Any new allergies or adverse reactions: No Arrival Time: 10:33 Had a fall or experienced change in No Accompanied By: self activities of daily living that may affect Transfer Assistance: None risk of falls: Patient Identification Verified: Yes Signs or symptoms of abuse/neglect since last visito No Secondary Verification Process Completed: Yes Hospitalized since last visit: No Patient Requires Transmission-Based Precautions: No Implantable device outside of the clinic excluding No Patient Has Alerts: No cellular tissue based products placed in the center since last visit: Has Dressing in Place as Prescribed: Yes Pain Present Now: Yes Electronic Signature(s) Signed: 11/06/2022 5:54:29 PM By: Zenaida Deed RN, BSN Entered By: Zenaida Deed on 11/06/2022 10:37:11 -------------------------------------------------------------------------------- Encounter Discharge Information Details Patient Name: Date of Service: [name], [name] 11/06/2022 9:45 A M Medical Record Number: [id number] Patient Account Number: [id number] Date of Birth/Sex: Treating RN: 06/11/1970 (52 y.o. [name] Primary Care [name]: [name] Other Clinician: Referring Glen Blatchley: Treating Rosser [name]/Extender: [name] in Treatment: 52 Encounter Discharge Information  Items Post Procedure Vitals Discharge Condition: Stable Temperature (F): 98.1 Ambulatory Status: Cane Pulse (bpm): 71 Discharge Destination: Home Respiratory Rate (breaths/min): 18 Transportation: Private Auto Blood Pressure (mmHg): 136/66 Accompanied By: self Schedule Follow-up Appointment: Yes Clinical Summary of Care: Patient Declined Electronic Signature(s) Signed: 11/06/2022 5:54:29 PM By: Zenaida Deed RN, BSN Entered By: Zenaida Deed on 11/06/2022 16:44:30 [name] ([id number]) [id number] Page 2 of 9 -------------------------------------------------------------------------------- Lower Extremity Assessment Details Patient Name: Date of Service: [name] [name] 11/06/2022 9:45 A M Medical Record Number: [id number] Patient Account Number: [id number] Date of Birth/Sex: Treating RN: 1971-01-05 (52 y.o. [name] Primary Care [name]: [name] Other Clinician: Referring Robbi Scurlock: Treating Medora Roorda/Extender: [name] in Treatment: 67 Edema Assessment Assessed: [Left: No] [Right: No] Edema: [Left: N] [Right: o] Calf Left: Right: Point of Measurement: 33 cm From Medial Instep 29.4 cm Ankle Left: Right: Point of Measurement: 10 cm From Medial Instep 19.4 cm Vascular Assessment Pulses: Dorsalis Pedis Palpable: [Right:Yes] Electronic Signature(s) Signed: 11/06/2022 5:54:29 PM By: Zenaida Deed RN, BSN Entered By: Zenaida Deed on 11/06/2022 10:39:44 -------------------------------------------------------------------------------- Multi Wound Chart Details Patient Name: Date of Service: [name], [name] 11/06/2022 9:45 A M Medical Record Number: [id number] Patient Account Number: [id number] Date of Birth/Sex: Treating RN: 16-Jul-1970 (52 y.o. F) Primary Care [name]: [name] Other Clinician: Referring [name]: Treating [name]/Extender: [name] in Treatment: 62 Vital Signs Height(in): 67 Pulse(bpm): 71 Weight(lbs): 134 Blood Pressure(mmHg): 136/66 Body Mass Index(BMI): 21 Temperature(F): 98.1 Respiratory Rate(breaths/min): 18 [17:Photos:] [21:No Photos] [N/A:N/A [id number] Page 3 of 9] Right, Lateral Lower Leg Right, Medial Ankle N/A Wound Location: Gradually Appeared Gradually Appeared N/A Wounding Event: Sickle Cell Lesion Sickle Cell Lesion N/A Primary Etiology: N/A Venous Leg Ulcer N/A Secondary Etiology: Anemia, Sickle Cell Disease, Anemia, Sickle Cell Disease, N/A Comorbid History: Neuropathy Neuropathy 10/05/2012 06/26/2021 N/A Date Acquired: 36 71 N/A [name] of Treatment: Open Open N/A Wound Status: No No N/A Wound Recurrence: Yes No  N/A Clustered Wound: 10.5x5.8x0.1 8x3.8x0.1 N/A Measurements L x W x D (cm) 47.831 23.876 N/A A (cm) : rea 4.783 2.388 N/A Volume (cm) : 74.50% 17.60% N/A % Reduction in A rea: 74.50% 17.60% N/A % Reduction in Volume: Full Thickness Without Exposed Full Thickness Without Exposed N/A Classification: Support Structures Support Structures Large Medium N/A Exudate A mount: Serosanguineous Serosanguineous N/A Exudate Type: red, brown red, brown N/A Exudate Color: Distinct, outline attached Distinct, outline attached N/A Wound Margin: Small (1-33%) Small (1-33%) N/A Granulation A mount: Pink Pink N/A Granulation Quality: Large (67-100%) Large (67-100%) N/A Necrotic A mount: Fat Layer (Subcutaneous Tissue): Yes Fat Layer (Subcutaneous Tissue): Yes N/A Exposed Structures: Fascia: No Fascia: No Tendon: No Tendon: No Muscle: No Muscle: No Joint: No Joint: No Bone: No Bone: No Small (1-33%) Small (1-33%) N/A Epithelialization: Debridement - Selective/Open Wound Debridement - Selective/Open Wound N/A Debridement: Pre-procedure Verification/Time Out 10:55 10:55 N/A Taken: Lidocaine 4% Topical Solution Lidocaine 4%  Topical Solution N/A Pain Control: [facility] N/A Tissue Debrided: Non-Viable Tissue Non-Viable Tissue N/A Level: 2.39 23.86 N/A Debridement A (sq cm): rea Curette Curette N/A Instrument: Minimum Minimum N/A Bleeding: Pressure Pressure N/A Hemostasis A chieved: 5 5 N/A Procedural Pain: 4 4 N/A Post Procedural Pain: Procedure was tolerated well Procedure was tolerated well N/A Debridement Treatment Response: 10.5x5.8x0.1 8x3.8x0.1 N/A Post Debridement Measurements L x W x D (cm) 4.783 2.388 N/A Post Debridement Volume: (cm) Scarring: Yes Scarring: Yes N/A Periwound Skin Texture: No Abnormalities Noted No Abnormalities Noted N/A Periwound Skin Moisture: Hemosiderin Staining: Yes No Abnormalities Noted N/A Periwound Skin Color: Ecchymosis: No No Abnormality No Abnormality N/A Temperature: Yes Yes N/A Tenderness on Palpation: Debridement Debridement N/A Procedures Performed: Treatment Notes Electronic Signature(s) Signed: 11/06/2022 11:17:16 AM By: Duanne Guess MD FACS Entered By: Duanne Guess on 11/06/2022 11:17:15 -------------------------------------------------------------------------------- Multi-Disciplinary Care Plan Details Patient Name: Date of Service: [name], [name] 11/06/2022 9:45 A M Medical Record Number: [id number] Patient Account Number: [id number] Date of Birth/Sex: Treating RN: 1970-07-03 (52 y.o. [name] Primary Care Cristianna Cyr: [name] Other Clinician: Referring Angelli Baruch: Treating Joseth Weigel/Extender: [name] in Treatment: [address], [place] ([id number]) [id number] Page 4 of 9 Multidisciplinary Care Plan reviewed with physician Active Inactive Venous Leg Ulcer Nursing Diagnoses: Actual venous Insuffiency (use after diagnosis is confirmed) Knowledge deficit related to disease process and management Goals: Patient will maintain optimal edema control Date  Initiated: 06/26/2021 Target Resolution Date: 11/15/2022 Goal Status: Active Interventions: Assess peripheral edema status every visit. Compression as ordered Treatment Activities: Therapeutic compression applied : 06/26/2021 Notes: Wound/Skin Impairment Nursing Diagnoses: Impaired tissue integrity Goals: Patient/caregiver will verbalize understanding of skin care regimen Date Initiated: 04/17/2021 Target Resolution Date: 11/15/2022 Goal Status: Active Ulcer/skin breakdown will have a volume reduction of 30% by week 4 Date Initiated: 04/17/2021 Date Inactivated: 05/29/2021 Target Resolution Date: 05/15/2021 Goal Status: Met Ulcer/skin breakdown will have a volume reduction of 50% by week 8 Date Initiated: 05/29/2021 Date Inactivated: 06/26/2021 Target Resolution Date: 06/26/2021 Goal Status: Unmet Unmet Reason: venous reflux Interventions: Assess patient/caregiver ability to obtain necessary supplies Assess patient/caregiver ability to perform ulcer/skin care regimen upon admission and as needed Assess ulceration(s) every visit Provide education on ulcer and skin care Treatment Activities: Topical wound management initiated : 04/17/2021 Notes: 06/08/21: Left leg wounds greater than 30% volume reduction, right leg acute infection. Electronic Signature(s) Signed: 11/06/2022 5:54:29 PM By: Zenaida Deed RN, BSN Entered By: Zenaida Deed on 11/06/2022 10:56:35 -------------------------------------------------------------------------------- Pain Assessment Details Patient  Name: Date of Service: [name], [name] [name] 11/06/2022 9:45 A M Medical Record Number: [id number] Patient Account Number: [id number] Date of Birth/Sex: Treating RN: [birth year] (52 y.o. [name] Primary Care Shahzain Kiester: [name] Other Clinician: Referring Zakyria Metzinger: Treating Shaana Acocella/Extender: [name] in Treatment: 20 Active Problems Location of Pain Severity and  Description of Pain [name], [name] ([id number]) [id number] Page 5 of 9 Patient Has Paino Yes Site Locations Pain Location: Pain in Ulcers With Dressing Change: Yes Rate the pain. Current Pain Level: 5 Worst Pain Level: 10 Least Pain Level: 3 Tolerable Pain Level: 5 Character of Pain Describe the Pain: Burning Pain Management and Medication Current Pain Management: Medication: Yes Is the Current Pain Management Adequate: Adequate How does your wound impact your activities of daily livingo Sleep: No Bathing: No Appetite: No Relationship With Others: No Bladder Continence: No Emotions: No Bowel Continence: No Drive: No Toileting: No Hobbies: No Dressing: No Electronic Signature(s) Signed: 11/06/2022 5:54:29 PM By: Zenaida Deed RN, BSN Entered By: Zenaida Deed on 11/06/2022 10:38:08 -------------------------------------------------------------------------------- Patient/Caregiver Education Details Patient Name: Date of Service: [name], [name] 6/19/2024andnbsp9:45 A M Medical Record Number: [id number] Patient Account Number: [id number] Date of Birth/Gender: Treating RN: Oct 04, 1970 (52 y.o. [name] Primary Care Physician: [name] Other Clinician: Referring Physician: Treating Physician/Extender: [name] in Treatment: 14 Education Assessment Education Provided To: Patient Education Topics Provided Venous: Methods: Explain/Verbal Responses: Reinforcements needed, State content correctly Wound/Skin Impairment: Methods: Explain/Verbal Responses: Reinforcements needed, State content correctly Electronic Signature(s) Crosby, Felecia Shelling ([id number]) [id number] Page 6 of 9 Signed: 11/06/2022 5:54:29 PM By: Zenaida Deed RN, BSN Entered By: Zenaida Deed on 11/06/2022 10:57:57 -------------------------------------------------------------------------------- Wound  Assessment Details Patient Name: Date of Service: [name], [name] [name] 11/06/2022 9:45 A M Medical Record Number: [id number] Patient Account Number: [id number] Date of Birth/Sex: Treating RN: 1971/03/09 (52 y.o. [name] Primary Care Paris Chiriboga: [name] Other Clinician: Referring [name]: Treating Keva Darty/Extender: [name] in Treatment: 33 Wound Status Wound Number: 17 Primary Etiology: Sickle Cell Lesion Wound Location: Right, Lateral Lower Leg Wound Status: Open Wounding Event: Gradually Appeared Comorbid History: Anemia, Sickle Cell Disease, Neuropathy Date Acquired: 10/05/2012 [name] Of Treatment: 81 Clustered Wound: Yes Photos Wound Measurements Length: (cm) 10.5 Width: (cm) 5.8 Depth: (cm) 0.1 Area: (cm) 47.831 Volume: (cm) 4.783 % Reduction in Area: 74.5% % Reduction in Volume: 74.5% Epithelialization: Small (1-33%) Tunneling: No Undermining: No Wound Description Classification: Full Thickness Without Exposed Support Structures Wound Margin: Distinct, outline attached Exudate Amount: Large Exudate Type: Serosanguineous Exudate Color: red, brown Foul Odor After Cleansing: No Slough/Fibrino Yes Wound Bed Granulation Amount: Small (1-33%) Exposed Structure Granulation Quality: Pink Fascia Exposed: No Necrotic Amount: Large (67-100%) Fat Layer (Subcutaneous Tissue) Exposed: Yes Necrotic Quality: Adherent Slough Tendon Exposed: No Muscle Exposed: No Joint Exposed: No Bone Exposed: No Periwound Skin Texture Texture Color No Abnormalities Noted: No No Abnormalities Noted: No Scarring: Yes Ecchymosis: No Hemosiderin Staining: Yes Moisture No Abnormalities Noted: Yes Temperature / Pain Temperature: No Abnormality Tenderness on Palpation: [name], [name] ([id number]) [id number] Page 7 of 9 Treatment Notes Wound #17 (Lower Leg) Wound Laterality: Right, Lateral Cleanser Soap and  Water Discharge Instruction: May shower and wash wound with dial antibacterial soap and water prior to dressing change. Wound Cleanser Discharge Instruction: Cleanse the wound with wound cleanser prior to applying a clean dressing using gauze sponges, not tissue or cotton balls. Peri-Wound Care Triamcinolone 15 (g) Discharge Instruction: Use  triamcinolone 15 (g) as directed Sween Lotion (Moisturizing lotion) Discharge Instruction: Apply moisturizing lotion as directed Topical Primary Dressing MediHoney Gel, tube 1.5 (oz) Discharge Instruction: Apply to wound bed in clinic if santyl not available Santyl Ointment Discharge Instruction: Apply nickel thick amount to wound bed as instructed Secondary Dressing ABD Pad, 5x9 Discharge Instruction: Apply over primary dressing as directed. Woven Gauze Sponge, Non-Sterile 4x4 in Discharge Instruction: Moisten with normal saline and Apply over primary dressing as directed. Secured With Elastic Bandage 4 inch (ACE bandage) Discharge Instruction: Secure with ACE bandage as directed. Kerlix Roll Sterile, 4.5x3.1 (in/yd) Discharge Instruction: Secure with Kerlix as directed. Compression Wrap Compression Stockings Add-Ons Electronic Signature(s) Signed: 11/06/2022 5:54:29 PM By: Zenaida Deed RN, BSN Entered By: Zenaida Deed on 11/06/2022 10:47:50 -------------------------------------------------------------------------------- Wound Assessment Details Patient Name: Date of Service: [name], [name] 11/06/2022 9:45 A M Medical Record Number: [id number] Patient Account Number: [id number] Date of Birth/Sex: Treating RN: 1970/10/02 (53 y.o. [name] Primary Care Isamar Nazir: [name] Other Clinician: Referring Jkai Arwood: Treating Verlinda Slotnick/Extender: [name] in Treatment: 86 Wound Status Wound Number: 21 Primary Etiology: Sickle Cell Lesion Wound Location: Right, Medial Ankle Secondary Etiology:  Venous Leg Ulcer Wounding Event: Gradually Appeared Wound Status: Open Date Acquired: 06/26/2021 Comorbid History: Anemia, Sickle Cell Disease, Neuropathy [name] Of Treatment: 71 Clustered Wound: No Wound Measurements Length: (cm) 8 [place], [name] ([id number]) Width: (cm) 3.8 Depth: (cm) 0.1 Area: (cm) 23.876 Volume: (cm) 2.388 % Reduction in Area: 17.6% [id number] Page 8 of 9 % Reduction in Volume: 17.6% Epithelialization: Small (1-33%) Tunneling: No Undermining: No Wound Description Classification: Full Thickness Without Exposed Support Structures Wound Margin: Distinct, outline attached Exudate Amount: Medium Exudate Type: Serosanguineous Exudate Color: red, brown Foul Odor After Cleansing: No Slough/Fibrino Yes Wound Bed Granulation Amount: Small (1-33%) Exposed Structure Granulation Quality: Pink Fascia Exposed: No Necrotic Amount: Large (67-100%) Fat Layer (Subcutaneous Tissue) Exposed: Yes Necrotic Quality: Adherent Slough Tendon Exposed: No Muscle Exposed: No Joint Exposed: No Bone Exposed: No Periwound Skin Texture Texture Color No Abnormalities Noted: No No Abnormalities Noted: Yes Scarring: Yes Temperature / Pain Temperature: No Abnormality Moisture No Abnormalities Noted: Yes Tenderness on Palpation: Yes Treatment Notes Wound #21 (Ankle) Wound Laterality: Right, Medial Cleanser Soap and Water Discharge Instruction: May shower and wash wound with dial antibacterial soap and water prior to dressing change. Wound Cleanser Discharge Instruction: Cleanse the wound with wound cleanser prior to applying a clean dressing using gauze sponges, not tissue or cotton balls. Peri-Wound Care Triamcinolone 15 (g) Discharge Instruction: Use triamcinolone 15 (g) as directed Sween Lotion (Moisturizing lotion) Discharge Instruction: Apply moisturizing lotion as directed Topical Primary Dressing MediHoney Gel, tube 1.5 (oz) Discharge  Instruction: Apply to wound bed in clinic if santyl not available Santyl Ointment Discharge Instruction: Apply nickel thick amount to wound bed as instructed Secondary Dressing ABD Pad, 5x9 Discharge Instruction: Apply over primary dressing as directed. Woven Gauze Sponge, Non-Sterile 4x4 in Discharge Instruction: Moisten with normal saline and Apply over primary dressing as directed. Secured With Elastic Bandage 4 inch (ACE bandage) Discharge Instruction: Secure with ACE bandage as directed. Kerlix Roll Sterile, 4.5x3.1 (in/yd) Discharge Instruction: Secure with Kerlix as directed. Compression Wrap Compression Stockings Add-Ons Electronic Signature(s) Signed: 11/06/2022 5:54:29 PM By: Zenaida Deed RN, BSN [place], Felecia Shelling ([id number]) PM By: Zenaida Deed RN, BSN [phone].pdf Page 9 of 9 Signed: 11/06/2022 5:54:29 Entered By: Zenaida Deed on 11/06/2022 10:48:18 -------------------------------------------------------------------------------- Vitals Details Patient Name: Date of Service: [name], [name]  [name] 11/06/2022 9:45 A M Medical Record Number: [id number] Patient Account Number: [id number] Date of Birth/Sex: Treating RN: [birth year] (52 y.o. [name] Primary Care Warda Mcqueary: [name] Other Clinician: Referring Jere Bostrom: Treating Bonni Neuser/Extender: [name] in Treatment: 71 Vital Signs Time Taken: 10:37 Temperature (F): 98.1 Height (in): 67 Pulse (bpm): 71 Weight (lbs): 134 Respiratory Rate (breaths/min): 18 Body Mass Index (BMI): 21 Blood Pressure (mmHg): 136/66 Reference Range: 80 - 120 mg / dl Electronic Signature(s) Signed: 11/06/2022 5:54:29 PM By: Zenaida Deed RN, BSN Entered By: Zenaida Deed on 11/06/2022 10:38:59

## 2022-11-07 NOTE — Progress Notes (Signed)
ANATASIA, SCHIAVI ([id number]) [id number] Page 1 of 17 Visit Report for 11/06/2022 Chief Complaint Document Details Patient Name: Date of Service: [name] [name] 11/06/2022 9:45 A M Medical Record Number: [id number] Patient Account Number: [id number] Date of Birth/Sex: Treating RN: Apr 11, 1971 (52 y.o. F) Primary Care Provider: Julianne Handler Other Clinician: Referring Provider: Treating Provider/Extender: Koleen Nimrod Weeks in Treatment: 28 Information Obtained from: Patient Chief Complaint [name] patient is here for evaluation of her bilateral lower extremity sickle cell ulcers 04/17/2021; patient comes in for substantial wounds on [name] right [name] left lower leg Electronic Signature(s) Signed: 11/06/2022 11:17:24 AM By: Duanne Guess MD FACS Entered By: Duanne Guess on 11/06/2022 11:17:24 -------------------------------------------------------------------------------- Debridement Details Patient Name: Date of Service: [name], [name] 11/06/2022 9:45 A M Medical Record Number: [id number] Patient Account Number: [id number] Date of Birth/Sex: Treating RN: [birth year] (52 y.o. [name] Primary Care Provider: Julianne Handler Other Clinician: Referring Provider: Treating Provider/Extender: Koleen Nimrod Weeks in Treatment: 81 Debridement Performed for Assessment: Wound #21 Right,Medial Ankle Performed By: Physician Duanne Guess, MD Debridement Type: Debridement Severity of Tissue Pre Debridement: Fat layer exposed Level of Consciousness (Pre-procedure): Awake [name] Alert Pre-procedure Verification/Time Out Yes - 10:55 Taken: Pain Control: Lidocaine 4% T opical Solution Percent of Wound Bed Debrided: 100% T Area Debrided (cm): otal 23.86 Tissue [name] other material debrided: Non-Viable, Slough, Slough Level: Non-Viable Tissue Debridement Description: Selective/Open Wound Instrument: Curette Bleeding:  Minimum Hemostasis Achieved: Pressure Procedural Pain: 5 Post Procedural Pain: 4 Response to Treatment: Procedure was tolerated well Level of Consciousness (Post- Awake [name] Alert procedure): Post Debridement Measurements of Total Wound Length: (cm) 8 Width: (cm) 3.8 Depth: (cm) 0.1 Volume: (cm) 2.388 Character of Wound/Ulcer Post Debridement: Kenyon Ana ([id number]) [id number] Page 2 of 17 Severity of Tissue Post Debridement: Fat layer exposed Post Procedure Diagnosis Same as Pre-procedure Notes Scribed for Dr Lady Gary by Zenaida Deed, RN Electronic Signature(s) Signed: 11/06/2022 11:39:54 AM By: Duanne Guess MD FACS Signed: 11/06/2022 5:54:29 PM By: Zenaida Deed RN, BSN Entered By: Zenaida Deed on 11/06/2022 11:00:18 -------------------------------------------------------------------------------- Debridement Details Patient Name: Date of Service: [name], [name] 11/06/2022 9:45 A M Medical Record Number: [id number] Patient Account Number: [id number] Date of Birth/Sex: Treating RN: Mar 23, 1971 (52 y.o. [name] Primary Care Provider: Julianne Handler Other Clinician: Referring Provider: Treating Provider/Extender: Koleen Nimrod Weeks in Treatment: 41 Debridement Performed for Assessment: Wound #17 Right,Lateral Lower Leg Performed By: Physician Duanne Guess, MD Debridement Type: Debridement Level of Consciousness (Pre-procedure): Awake [name] Alert Pre-procedure Verification/Time Out Yes - 10:55 Taken: Start Time: 10:55 Pain Control: Lidocaine 4% T opical Solution Percent of Wound Bed Debrided: 5% T Area Debrided (cm): otal 2.39 Tissue [name] other material debrided: Non-Viable, Slough, Slough Level: Non-Viable Tissue Debridement Description: Selective/Open Wound Instrument: Curette Bleeding: Minimum Hemostasis Achieved: Pressure Procedural Pain: 5 Post Procedural Pain: 4 Response to  Treatment: Procedure was tolerated well Level of Consciousness (Post- Awake [name] Alert procedure): Post Debridement Measurements of Total Wound Length: (cm) 10.5 Width: (cm) 5.8 Depth: (cm) 0.1 Volume: (cm) 4.783 Character of Wound/Ulcer Post Debridement: Stable Post Procedure Diagnosis Same as Pre-procedure Notes Scribed for Dr Lady Gary by Zenaida Deed, RN Electronic Signature(s) Signed: 11/06/2022 11:39:54 AM By: Duanne Guess MD FACS Signed: 11/06/2022 5:54:29 PM By: Zenaida Deed RN, BSN Entered By: Zenaida Deed on 11/06/2022 11:01:26 [name] ([id number]) [id number] Page 3 of 17 -------------------------------------------------------------------------------- HPI Details Patient Name: Date of Service: [name], [place] [name] 11/06/2022  9:45 A M Medical Record Number: [id number] Patient Account Number: [id number] Date of Birth/Sex: Treating RN: Jul 10, 1970 (52 y.o. F) Primary Care Provider: Julianne Handler Other Clinician: Referring Provider: Treating Provider/Extender: Koleen Nimrod Weeks in Treatment: 62 History of Present Illness Location: medial [name] lateral ankle region on [name] right [name] left medial malleolus Quality: Patient reports experiencing a shooting pain to affected area(s). Severity: Patient states wound(s) are getting worse. Duration: right lower extremity bimalleolar ulcers have been present for approximately 2 years; [name] right medial malleolus ulcer has been there proximally 6 months Timing: Pain in wound is constant (hurts all [name] time) Context: [name] wound would happen gradually ssociated Signs [name] Symptoms: Patient reports having increase discharge. A HPI Description: 52 year old patient with a history of sickle cell anemia who was last seen by me with ulceration of [name] right lower extremity above [name] ankle [name] was referred to Dr. Marzetta Board for a surgical debridement as I was unable to do anything in [name]  office due to excruciating pain. At that stage she was referred from [name] plastic surgery service to dermatology who treated her for a skin infection with doxycycline [name] then Levaquin [name] a local antibiotic ointment. I understand [name] patient has since developed ulceration on [name] left ankle both medial [name] lateral [name] was now referred back to [name] wound center as dermatology has finished [name] management. I do not have any notes from [name] dermatology department Old notes: 52 year old patient with a history of sickle cell anemia, pain bilateral lower extremities, right lower extremity ulcer [name] has a history of receiving a skin graft( Theraskin) several months ago. She has been visiting [name] wound center [facility] [name] was seen by Dr. Leanord Hawking [name] Dr. Marzetta Board. after prolonged conservator therapy between July 2016 [name] January 2017. She had been seen by [name] plastic surgeon [name] taken to [name] OR for debridement [name] application of Theraskin. She had 3 applications of Theraskin [name] was then treated with collagen. Prior to that she had a history of similar problems in 2014 [name] was treated conservatively. Had a reflux study done for [name] right lower extremity in August 2016 without reflux or DVT . Past medical history significant for sickle cell disease, anemia, leg ulcers, cholelithiasis,[name] has never been a smoker. Once [name] patient was discharged on [name] wound center she says within 2 or 3 weeks [name] problems recurred [name] she has been treating it conservatively. since I saw her 3 weeks ago at [facility] she has been unable to get her dressing material but has completed a course of doxycycline. 6/7/ 2017 -- lower extremity venous duplex reflux evaluation was done No evidence of SVT or DVT in [name] RLL. No venous incompetence in [name] RLL. No further vascular workup is indicated at this time. She was seen by Dr. Mina Marble, on 10/04/2015. She agreed with [name] plan of taking her to [name] OR for debridement [name] application  of theraskin [name] would also take biopsies to rule out pyoderma gangrenosum. Follow-up note dated May 31 received [name] she was status post application of Theraskin to multiple ulcers around [name] right ankle. Pathology did not show evidence of malignancy or pyoderma gangrenosum. She would continue to see as in [name] wound clinic for further care [name] see Dr. Marzetta Board as needed. [name] patient brought [name] biopsy report [name] it was consistent with stasis ulcer no evidence of malignancy [name] [name] comment was that there was some adjacent neovascularization, fibrosis [name] patchy perivascular chronic inflammation. 11/15/2015 -- today we applied her  first application of Theraskin 11/30/15; TheraSkin #2 12/13/2015 -- she is having a lot of pain locally [name] is here for possible application of a theraskin today. 01/16/2016 -- [name] patient has significant pain [name] has noticed despite in spite of all local care [name] oral pain medication. It is impossible to debride her in [name] office. 02/06/2016 -- I do not see any notes from Dr. Leta Baptist( [name] patient has not made a call to [name] office know as she heard from them) [name] [name] only visit to recently was with her PCP Dr. Gypsy Decant -- I saw her on 01/16/2016 [name] prescribed 90 tablets of oxycodone 10 mg [name] did lab work [name] screening for HIV. [name] HIV was negative [name] hemoglobin was 6.3 with a WBC count of 14.9 [name] hematocrit of 17.8 with platelets of 561. reticulocyte count was 15.5% READMISSION: 07/10/2016- [name] patient is here for readmission for bilateral lower extremity ulcers in [name] presence of sickle cell. [name] bimalleolar ulcers to [name] right lower extremity have been present for approximately 2 years, [name] left medial malleolus ulcer has been present approximately 6 months. She has followed with Dr.Thimmappa in [name] past [name] has had a total of 3 applications of Theraskin (01/2015, 09/2015, 06/17/16). She has also followed with Dr. Meyer Russel here in [name] clinic [name] has received 2  applications of TheraSkin (11/10/15, 11/30/15). [name] patient does experience chronic, [name] is not amenable to debridement. 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 She is not currently on any antibiotic therapy [name] has not been treated with any recently. 07/17/2016 -- was seen by Dr. Leta Baptist of plastic surgery who saw her 2 weeks postop application of Theraskin #3. She had removed her dressing [name] asked her to apply silver alginate on alternate days [name] follow-up back with [name] wound center. Future debridements [name] application of skin substitute would have to be done in [name] hospital due to her high risk for anesthesia. READMISSION 04/17/2021 Patient is now a 52 year old woman that we have had in this clinic for a prolonged period of time [name] 2016-2017 [name] then again for 2 visits in February 2018. At that point she had wounds on [name] right lower leg predominantly medial. She had also been seen by plastic surgery Dr. Marzetta Board who I believe took her to [name] OR for operative debridement [name] application of TheraSkin in 2017. After she left our clinic she was followed for a very prolonged period of time in [name] wound care center in [facility] who then referred her ultimately to [facility] where she was seen by Dr. Mardene Speak. Again taken her to [name] OR for skin grafting which apparently did not take. She had multiple other attempts at dressings although I have not really looked over all of these notes in great detail. She has not been seen in a wound care center in about a year. She states over [name] last year in addition to her right lower leg she has developed wounds on [name] left lower leg quite extensive. She is using Xeroform to all of these wounds without really any improvement. She also has Medicaid which does not cover wound products. [name] patient has had vascular work-ups in [name] past including most recently on 03/28/2021 showing biphasic waveforms on [name] right triphasic at [name]  PTA [name] biphasic at [name] dorsalis pedis on [name] left. She was unable to tolerate any degree of compression to do ABIs. Unfortunately TBI's were also not done. She had venous reflux  studies done in 2017. This did not show any evidence of a DVT or SVT [name] no venous incompetence was noted in [name] right leg at [name] time this was [name] only side with [name] wound As noted I did not look all over her old records. She apparently had a course of HBO [name] Baptist although I am not sure what [name] indication would have been. In [place] ([id number]) [id number] Page 4 of 17 any case she developed seizures [name] terminated treatment earlier. She is generally much more disabled than when we last saw her in clinic. She can no longer walk pretty much wheelchair-bound because predominantly of pain in [name] left hip. 04/24/2021; [name] patient tolerated [name] wraps we put on. We used Santyl [name] Hydrofera Blue under compression. I brought her back for a nurse visit for a change in dressing. With Medicaid we will have a hard time getting anything paid for [name] hence [name] need for compression. She arrives in clinic with all [name] wounds looking somewhat better in terms of surface 12/20; circumferential wound on [name] right from [name] lateral to [name] medial. She has open areas on [name] left medial [name] left lateral x2 on all of this with [name] same surface. This does not look completely healthy although she does have some epithelialization. She is not complaining of a lot of pain which is unusual for her sickle ulcers. I have not looked over her extensive records from [facility]. She had recent arterial studies [name] has a history of venous reflux studies I will need to look these over although I do not believe she has significant arterial disease 2023 05/22/2021; patient's wound areas measure slightly smaller. Still a lot of drainage coming from [name] right we have been using Hydrofera Blue [name] Santyl with some improvement in  [name] wound surfaces. She tells me she will be getting transfused later in [name] week for her underlying sickle cell anemia I have looked over her recent arterial studies which were done in [name] fall. This was in November [name] showed biphasic [name] triphasic waveforms but she could not tolerate ABIs because of pressure [name] unfortunately TBI's were not done. She has not had recent venous reflux studies that I can see 1/10; not much change about [name] same surface area. This has a yellowish surface to it very gritty. We have been using Santyl [name] Hydrofera Blue for a prolonged period. Culture I did last week showed methicillin sensitive staph aureus "rare". Our intake nurse reports greenish drainage which may be [name] Hydrofera Blue itself 1/17; wounds are continue to measure smaller although I am not sure about [name] accuracy here. Especially [name] areas on [name] right are covered in what looks to be a nonviable surface although she does have some epithelialization. Similarly she has areas on [name] left medial [name] left lateral ankle area which appear to have a better surface [name] perhaps are slightly smaller. We have been using Santyl [name] Hydrofera Blue. She cannot tolerate mechanical debridements She went for her reflux studies which showed significant reflux at [name] greater saphenous vein at [name] saphenofemoral junction as well as [name] greater saphenous vein in [name] proximal calf on [name] left she had reflux in [name] thigh [name] [name] common femoral vein [name] supra vein Fishel vein reflux in [name] greater saphenous vein. I will have vein [name] vascular look at this. My thoughts have been that these are likely sickle wounds. I looked through her old records from [facility] wound care center [name] then when she  graduated to [employer] [facility] wound care center where she saw Dr. Ronda Fairly [name] Dr. Mardene Speak. Although I can see she had reflux studies done I do not see that she actually saw a vein [name] vascular. I went over [name] fact that she  had operative debridements [name] actual skin grafting that did not take. I do not think these wounds have ever really progressed towards healing 1/31;Substantial wounds on [name] right ankle area. Hyper granulated very gritty adherent debris on [name] surface. 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 On [name] left she has no DVT or SVT deep vein reflux in [name] common femoral vein. Superficial vein reflux in [name] greater saphenous vein on although [name] vein diameter is not really all that large. I do not think there is anything that can be done with these although I am going to send her for consultation to vein [name] vascular. 2/7; Wound exam; substantial wound area on [name] right posterior ankle area [name] areas on [name] left medial ankle [name] left lateral ankle. I was able to debride [name] left medial ankle last week fairly aggressively [name] it is back this week to a completely nonviable surface She will see vascular surgery this Friday [name] I would like them to review [name] venous studies [name] also any comments on her arterial status. If they do not see an issue here I am going to refer her to plastic surgery for an operative debridement perhaps intraoperative ACell or Integra. Eventually she will require a deep tissue culture again 2/14; substantial wound area on [name] right posterior ankle, medial ankle. We have been using silver alginate [name] patient was seen by vein [name] vascular she had both venous reflux studies [name] arterial studies. In terms of [name] venous reflux studies she had a chronic DVT in [name] popliteal vein but no evidence of deep vein reflux. She had no  evidence of superficial venous thrombosis. She did have superficial vein reflux at [name] saphenofemoral junction [name] [name] greater saphenous vein. On [name] left no evidence of a DVT no evidence of superficial venous throat thrombosis she did have deep vein reflux in [name] common femoral vein [name] superficial vein reflux in [name] greater saphenous vein but these were not felt to be amenable to ablation. In terms of arterial studies she had triphasic [name] wife biphasic wave waveforms bilaterally not felt to have a significant arterial issue. I do not get [name] feeling that they felt that any part of her nonhealing wounds were related to either arterial or venous issues. They did note that she had venous reflux at [name] right at [name] [name] [facility]. [name] also on [name] left there were reflux in [name] deep system at [name] common femoral vein [name] greater saphenous vein in [name] proximal thigh. Nothing amenable to ablation. 2/20; she is making some decent progress on [name] right where there is nice skin between [name] 2 open areas on [name] right ankle. [name] surfaces here do not look viable yet there is some surrounding epithelialization. She still has a small area on [name] left medial ankle area. Hyper-granulated Jody's away always 2/28 patient has an appointment with  plastic surgery on 3/8. We will see her back on 3/9. She may have to call us to get [name] area redressed. We've been using Santyl under silver alginate. We made a nice improvement on [name] left medial ankle. [name] larger wounds on [name] right also looks somewhat better in terms of epithelialization although I think they could benefit from an aggressive debridement if plastic surgery would be willing to do that. Perhaps placement of Integra or a cell 07/26/2021: She saw Dr. Arita Miss yesterday. He raised [name] question as to whether or not this might be pyoderma [name] wanted to wait until that question was answered by dermatology before proceeding with any sort of operative debridement. We have continue  to use Santyl under silver alginate with Kerlix [name] Coban wraps. Overall, her wounds appear to be continuing to contract [name] epithelialize, with some granulation tissue present. There continues to be some slough on all wound surfaces. 08/09/2021: She has not been able to get an appointment with dermatology because apparently [name] offices in [facility] [name] do not accept Medicaid. She is looking into whether or not she can be seen at [name] main [facility] dermatology clinic. This is necessary because plastic surgery is concerned that her wounds might represent pyoderma [name] they did not want to do any procedure until that was clarified. We have been using Santyl under silver alginate with Kerlix [name] Coban wraps. Today, there was a greater amount of drainage on her dressings with a slight green discoloration [name] significant odor. Despite this, her wounds continue to contract [name] epithelialize. There is pale granulation tissue present [name] actually, on [name] left medial ankle, [name] granulation tissue is a bit hypertrophic. 08/16/2021: Last week, I took a culture [name] this grew back rare methicillin-resistant Staph aureus [name] rare corynebacterium. [name] MRSA was sensitive to gentamicin which we began applying topically on an empiric basis. This week, her wounds are a bit smaller [name] [name] drainage [name] odor are less. Her primary care provider is working on assisting [name] patient with a dermatology evaluation. She has been in silver alginate over [name] gentamicin that was started last week along with Kerlix [name] Coban wraps. 08/23/2021: Because she has Medicaid, we have been unable to get her into see any dermatologist in [name] to rule out pyoderma gangrenosum, which was a requirement from plastic surgery prior to any sort of debridement [name] grafting. Despite this, however, all of her wounds continue to get smaller. [name] wound on her left medial ankle is nearly closed. There is no odor from [name] wounds,  although she still accumulates a modest amount of drainage on her dressings. ZYAH, CHAPA ([id number]) [id number] Page 5 of 17 08/30/2021: [name] lateral right ankle wound [name] [name] medial left ankle wound are a bit smaller today. [name] medial right ankle wound is about [name] same size. They are less tender. We have still been unable to get her into dermatology. 09/06/2021: All of [name] wounds are about [name] same size today. She continues to endorse minimal pain. I communicated with Dr. Arita Miss in plastic surgery regarding our issues getting a dermatology appointment; he was out of town but indicated that he would look into perhaps performing [name] biopsy in his office [name] will have his office contact her. 09/14/2021: [name] patient has an appointment in dermatology, but it is not until October. Her wounds are roughly [name] same; she continues to have very thick purulent-looking drainage on her dressings. 09/20/2021: [name] left medial wound is nearly closed [name] just  has a bit of accumulated eschar on [name] surface. [name] right medial [name] lateral ankle wounds are perhaps a little bit smaller. They continue to have a very pale surface with accumulation of thin slough. 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 I did not think [employer] was indicated based on this. She is getting topical mupirocin with Prisma silver collagen. 10/04/2021: [name] patient was not seen in clinic last week due to childcare coverage issues. In [name] interim, [name] left medial leg wound has closed. [name] right sided leg wounds are smaller. There is more granulation tissue coming through, particularly on [name] lateral wound. [name] surface remains somewhat gritty. We have been applying topical mupirocin [name] Prisma silver collagen. 10/11/2021: [name] left medial leg wound remains closed. She does complain of some anesthetic sensation to [name] area. Both of [name] right-sided leg wounds are smaller but still have accumulated  slough. 10/18/2021: Both right-sided leg wounds are minimally smaller this week. She still continues to accumulate slough [name] has thick drainage on her dressings. 10/23/2021: Both wounds continue to contract. There is still slough buildup. She has been approved for a keratin-based skin substitute trial product but it will not be available until next week. 10/30/2021: [name] wounds are about [name] same to perhaps slightly smaller. There is still continued slough buildup. Unfortunately, [name] rep for [name] keratin based product did not show up today [name] did not answer his phone when called. 11/08/2021: [name] wounds are little bit smaller today. She continues to have thick drainage but [name] surfaces are relatively clean with just a little bit of slough accumulation. She reported to me today that she is unable to completely flex her left ankle [name] on examination it seems this is potentially related to scar tissue from her wounds. We do have [name] ProgenaMatrix trial product available for her today. 11/15/2021: Both wounds are smaller today. There is some slough accumulation on [name] surfaces, but [name] medial wound, in particular looks like it is filling in [name] is less deep. She did hear from physical therapy [name] she is going to start working with them on July 11. She is here for her second application of [name] trial skin substitute, ProgenaMatrix. 11/22/2021: Both wounds continue to contract, [name] medial more dramatically than [name] lateral. Both wounds have a layer of slough on [name] surface, but underneath this, [name] gritty fibrous tissue has a little bit more of a pink cast to it rather than being as pale as it has been. 11/29/2021: [name] wounds are roughly [name] same size this week, perhaps a millimeter or 2 smaller. [name] medial wound has filled in [name] is nearly flush with [name] surrounding skin surface. She continues to have a lot of slough accumulation on both surfaces. 12/06/2021: No significant change to her wounds, but she has a new  opening on her dorsal foot, just distal to [name] right lateral ankle wound. [name] area on her left medial ankle that reopened looks a little bit larger today. She has quite a bit of pain associated with [name] new wound. 12/12/2021: Her wounds look about [name] same but [name] new opening on her right lateral dorsal foot is a little bit bigger. She continues to have a fair amount of pain with this wound. 12/26/2021: [name] left medial ankle wound is tiny [name] superficial. She has 2 areas of crusting on her left lateral ankle, however, that appear to be threatening to open again. Her right medial ankle wound is a little bit smaller today  but still continues to accumulate thick rubbery slough. [name] new dorsal foot wound is exquisitely painful but there is no odor or purulent drainage. No erythema or induration. [name] right lateral ankle wound looks about [name] same today, again with thick rubbery slough. 01/03/2022: [name] left medial ankle wound has closed again. Both right ankle wounds appear to be about [name] same size with thick rubbery slough. [name] dorsal foot wound on [name] right continues to be quite painful [name] she stated that she did not want any debridement of that site today. 01/10/2022: No real change to any of her wounds. She continues to accumulate thick slough. [name] dorsal foot wound has merged with [name] lateral malleolar wound. She is experiencing significant pain in [name] dorsal foot portion of [name] ulcer. 01/16/2022: Absolutely no change or progress in her wounds. 01/24/2022: Her wounds are unchanged. She continues to build up slough [name] [name] wounds on her dorsal right foot are still exquisitely tender. 01/31/2022: [name] wounds actually measure a little bit narrower today. They still have thick slough on [name] surface but [name] underlying tissue seems a little less fibrotic. We changed to Iodosorb last week. 02/07/2022: Wounds continue to slowly epithelialize around [name] parameters. She has less pain today. She still accumulates a  fairly substantial layer of slough. 02/15/2022: No change to [name] wounds overall. [name] measured a little bit larger today per [name] intake nurse. She continues to have substantial slough accumulation [name] her pain is a little bit worse. 02/21/2022. No change at all to her wounds. I do not see that plastic surgery has received her referral yet. 02/28/2022: [name] wounds remain unchanged. They are dry [name] fibrotic with accumulation of slough [name] eschar. She did receive an appointment to see plastic surgery on October 23, but [name] office called her back [name] indicated they needed to reschedule [name] that [name] next available appointment was not until December. [name] patient became angry [name] decided she did not want to see this plastics group. 10/19; [name] patient sees Dr. Maylene Roes again next week. She is using Medihoney as [name] primary dressing changing this herself. 03/21/2022: She saw Dr. Ferd Hibbs at [name] wound care center at [facility]. Dr. Ferd Hibbs was also unable to debride her, secondary to pain [name] is planning to take her to [name] operating room for operative debridement [name] potential skin substitute placement. Her wounds are unchanged with thick yellow slough [name] a fibrotic base. She says they are too painful to be debrided. In addition, yesterday her hemoglobin was 4 [name] she received 2 units packed red blood cell transfusion. 04/04/2022: Her operation at [facility] is scheduled for December 15. She continues to use Medihoney on her wounds. They are a little bit less painful today [name] she is willing to entertain [name] possibility of debridement. [name] wounds measured a little bit larger in all dimensions today but [name] layer of slough is not as thick as usual. 04/18/2022: Her wounds actually measured a little bit smaller today [name] [name] extension onto [name] dorsal foot from [name] lateral wound has healed. They are less tender [name] there is significantly less slough present as compared to prior  visits. BOBETTE, BERTKE ([id number]) [id number] Page 6 of 17 05/09/2022: She had to reschedule her surgery due to lack of childcare. It is now planned for January 5. [name] wounds are basically unchanged, but she had a lot more drainage, which was thick [name] somewhat purulent in appearance. No significant odor. 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 They  are quite painful today [name] she does not want to have debridement performed. 05/30/2022: [name] patient underwent surgical debridement [name] placement of TheraSkin to her wounds on January 5. She has been doing well [name] does not endorse any pain. [name] TheraSkin is intact [name] looks beautiful. It is sutured in place. There is no exudate or significant drainage. 06/04/2022: Her TheraSkin remains intact [name] I am starting to see granulation tissue emerging underneath [name] surface. No concern for infection. 06/12/2022: [name] TheraSkin is intact [name] there is more granulation tissue emerging. It is starting to look a little bit dry, however. No malodor or purulent drainage. 06/19/2022: [name] TheraSkin continues to look good. [name] edges are a bit dry, but [name] central portion of each of her wounds is showing a nice pink color. 06/27/2022: [name] TheraSkin on [name] lateral wound remains almost completely intact. There is nice pink tissue peaking through [name] mesh of [name] TheraSkin. On [name] medial leg, it is starting to breakdown a little bit, but most of [name] TheraSkin remains intact here, as well. Both wounds measured smaller today. 07/03/2022: [name] wounds are fairly dry around [name] perimeter today. Some of [name] suture is hanging loose. 07/12/2022: Both wounds are measuring smaller today. [name] medial ulcer is getting a bit too dry. [name] lateral ulcer continues to have nice buds of granulation tissue emerging. 07/26/2022: [name] moisture balance on both wounds is much better today. For [name] first time since I began seeing her, I see actual beefy red granulation  tissue on [name] lateral ankle wound. There are more buds of deep pink granulation tissue emerging on [name] medial ankle. There is some dry eschar around [name] edges of [name] medial wound [name] a tiny amount of slough overlying [name] good granulation tissue on [name] lateral wound. 08/09/2022: Unfortunately, she has a new wound on her left lateral lower leg, just above [name] ankle. It is extremely painful for her. It penetrates to [name] fat layer [name] has [name] same woody, fibrotic character as her previous wounds. [name] other wounds are both a little bit smaller with slough [name] some eschar around [name] periphery. 08/14/2022: Her right lateral ankle wound is a little bit smaller. Unfortunately, she has had an increase in her drainage [name] it has a purulent nature to it, similar to what was present prior to her surgical debridement. She also reports odor coming from her wounds this week. [name] left lateral leg wound remains extremely tender. 08/21/2022: [name] left lateral ankle wound is larger [name] more painful today. Both right ankle wounds have a little bit more vital appearance, but they have accumulated more slough. She reports less odor this week. She is currently taking Augmentin. 08/28/2022: [name] left lateral ankle wound continues to enlarge [name] remains quite painful. Both of [name] right ankle wounds actually look better this week. There is improved tissue quality with less slough accumulation. 09/27/2022: [name] left lateral ankle wound actually looks nearly healed. She says that she has been applying some silver alginate that she had leftover. She was unable to afford [name] Santyl that was prescribed, but found a tube that she had on hand at home [name] has been using this on her right ankle wounds. These wounds look about [name] same [name] have accumulated [name] usual layer of slough. Unfortunately, a portion of [name] dorsal foot wound has reopened [name] is exquisitely painful today. 10/09/2022: [name] left lateral ankle wound is healed. [name] dorsal foot  wound appears to have expanded [name] remains exquisitely painful. [name] medial [name] lateral right  ankle wounds look about [name] same size, but they are quite a bit cleaner. 10/23/2022: [name] left lateral ankle remains closed. [name] dorsal foot wound has expanded to [name] point that it now converges with [name] right lateral leg wound. She has accumulated fairly thick slough on all of [name] open surfaces. 11/06/2022: No significant change to any of her wounds, although [name] dorsal foot aspect of [name] right lateral wound is a little bit smaller. She has been approved for [name] [employer] [name] [employer] for [name] [employer] [name] will be able to get a full year's supply. Electronic Signature(s) Signed: 11/06/2022 11:18:48 AM By: Duanne Guess MD FACS Entered By: Duanne Guess on 11/06/2022 11:18:48 -------------------------------------------------------------------------------- Physical Exam Details Patient Name: Date of Service: [name] [name], [name] 11/06/2022 9:45 A M Medical Record Number: [id number] Patient Account Number: [id number] Date of Birth/Sex: Treating RN: Jan 16, 1971 (52 y.o. F) Primary Care Provider: Julianne Handler Other Clinician: Referring Provider: Treating Provider/Extender: Koleen Nimrod Weeks in Treatment: 21 Constitutional . . . . no acute distress. Respiratory Normal work of breathing on room air. Notes 11/06/2022: No significant change to any of her wounds, although [name] dorsal foot aspect of [name] right lateral wound is a little bit smaller. SINIA, HEASLIP ([id number]) [id number] Page 7 of 17 Electronic Signature(s) Signed: 11/06/2022 11:19:13 AM By: Duanne Guess MD FACS Entered By: Duanne Guess on 11/06/2022 11:19:12 -------------------------------------------------------------------------------- Physician Orders Details Patient Name: Date of Service: [name] [name], [name] 11/06/2022 9:45 A M Medical Record Number: [id number] Patient  Account Number: [id number] Date of Birth/Sex: Treating RN: 10-08-1970 (52 y.o. 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 - Dr. Lady Gary Rm 2 Return A Anesthetic (In clinic) Topical Lidocaine 5% applied to wound bed Bathing/ Shower/ Hygiene May shower [name] wash wound with soap [name] water. Edema Control - Lymphedema / SCD / Other Elevate legs to [name] level of [name] heart or above for 30 minutes daily [name]/or when sitting for 3-4 times a day throughout [name] day. Avoid standing for long periods of time. Exercise regularly Additional Orders / Instructions Follow Nutritious Diet Juven Shake 1-2 times daily. Wound Treatment Wound #17 - Lower Leg Wound Laterality: Right, Lateral Cleanser: Soap [name] Water 1 x Per Week/30 Days Discharge Instructions: May shower [name] wash wound with dial antibacterial soap [name] water prior to dressing change. Cleanser: Wound Cleanser 1 x Per Week/30 Days Discharge Instructions: Cleanse [name] wound with wound cleanser prior to applying a clean dressing using gauze sponges, not tissue or cotton balls. Peri-Wound Care: Triamcinolone 15 (g) 1 x Per Week/30 Days Discharge Instructions: Use triamcinolone 15 (g) as directed Peri-Wound Care: Sween Lotion (Moisturizing lotion) 1 x Per Week/30 Days Discharge Instructions: Apply moisturizing lotion as directed Prim Dressing: MediHoney Gel, tube 1.5 (oz) 1 x Per Week/30 Days ary Discharge Instructions: Apply to wound bed in clinic if santyl not available Prim Dressing: Santyl Ointment 1 x Per Week/30 Days ary Discharge Instructions: Apply  nickel thick amount to wound bed as instructed Secondary Dressing: ABD Pad, 5x9 1 x Per Week/30 Days Discharge Instructions: Apply over primary dressing as directed. Secondary Dressing: Woven Gauze Sponge, Non-Sterile 4x4 in 1 x Per Week/30 Days Discharge Instructions: Moisten with  normal saline [name] Apply over primary dressing as directed. Secured With: Elastic Bandage 4 inch (ACE bandage) 1 x Per Week/30 Days Discharge Instructions: Secure with ACE bandage as directed. MARIS, MEDEARIS ([id number]) [id number] Page 8 of 17 Secured With: [employer], 4.5x3.1 (in/yd) 1 x Per Week/30 Days Discharge Instructions: Secure with Kerlix as directed. Wound #21 - Ankle Wound Laterality: Right, Medial Cleanser: Soap [name] Water 1 x Per Week/30 Days Discharge Instructions: May shower [name] wash wound with dial antibacterial soap [name] water prior to dressing change. Cleanser: Wound Cleanser 1 x Per Week/30 Days Discharge Instructions: Cleanse [name] wound with wound cleanser prior to applying a clean dressing using gauze sponges, not tissue or cotton balls. Peri-Wound Care: Triamcinolone 15 (g) 1 x Per Week/30 Days Discharge Instructions: Use triamcinolone 15 (g) as directed Peri-Wound Care: Sween Lotion (Moisturizing lotion) 1 x Per Week/30 Days Discharge Instructions: Apply moisturizing lotion as directed Prim Dressing: MediHoney Gel, tube 1.5 (oz) 1 x Per Week/30 Days ary Discharge Instructions: Apply to wound bed in clinic if santyl not available Prim Dressing: Santyl Ointment 1 x Per Week/30 Days ary Discharge Instructions: Apply nickel thick amount to wound bed as instructed Secondary Dressing: ABD Pad, 5x9 1 x Per Week/30 Days Discharge Instructions: Apply over primary dressing as directed. Secondary Dressing: Woven Gauze Sponge, Non-Sterile 4x4 in 1 x Per Week/30 Days Discharge Instructions: Moisten with normal saline [name] Apply over primary dressing as  directed. Secured With: Elastic Bandage 4 inch (ACE bandage) 1 x Per Week/30 Days Discharge Instructions: Secure with ACE bandage as directed. Secured With: [employer], 4.5x3.1 (in/yd) 1 x Per Week/30 Days Discharge Instructions: Secure with Kerlix as directed. Electronic Signature(s) Signed: 11/06/2022 11:39:54 AM By: Duanne Guess MD FACS Entered By: Duanne Guess on 11/06/2022 11:19:27 -------------------------------------------------------------------------------- Problem List Details Patient Name: Date of Service: [name] [name], [name] 11/06/2022 9:45 A M Medical Record Number: [id number] Patient Account Number: [id number] Date of Birth/Sex: Treating RN: February 25, 1971 (52 y.o. [name] Primary Care Provider: Julianne Handler Other Clinician: Referring Provider: Treating Provider/Extender: Koleen Nimrod Weeks in Treatment: 64 Active Problems ICD-10 Encounter Code Description Active Date MDM Diagnosis L97.818 Non-pressure chronic ulcer of other part of right lower leg with other specified 04/17/2021 No Yes severity 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 04/17/2021 No Yes Inactive Problems ICD-10 JAHNAE, MACNAMARA ([id number]) [id number] Page 9 of 17 Code Description Active Date Inactive Date L97.828 Non-pressure chronic ulcer of other part of left lower leg with other specified severity 04/17/2021 04/17/2021 Resolved Problems ICD-10 Code Description Active Date Resolved Date L97.822 Non-pressure chronic ulcer of other part of left lower leg with fat layer exposed 08/09/2022 08/09/2022 Electronic Signature(s) Signed: 11/06/2022 11:16:13 AM By: Duanne Guess MD FACS Entered By: Duanne Guess on 11/06/2022 11:16:12 -------------------------------------------------------------------------------- Progress Note Details Patient Name: Date of Service: [name] [name], [name] 11/06/2022 9:45 A M Medical Record Number:  [id number] Patient Account Number: [id number] Date of Birth/Sex: Treating RN: 01/29/1971 (52 y.o. F) Primary Care Provider: Julianne Handler Other Clinician: Referring Provider: Treating Provider/Extender: Koleen Nimrod Weeks in Treatment: 60 Subjective Chief Complaint Information obtained from Patient [name] patient is here for evaluation of her bilateral lower extremity sickle cell ulcers 04/17/2021; patient comes in for substantial wounds on [name] right [name] left lower leg History of Present Illness (HPI) [name] following HPI elements were documented for [name] patient's wound: Location: medial [name] lateral ankle region on [name] right [name] left medial malleolus Quality: Patient reports experiencing a shooting  pain to affected area(s). Severity: Patient states wound(s) are getting worse. Duration: right lower extremity bimalleolar ulcers have been present for approximately 2 years; [name] right medial malleolus ulcer has been there proximally 6 months Timing: Pain in wound is constant (hurts all [name] time) Context: [name] wound would happen gradually Associated Signs [name] Symptoms: Patient reports having increase discharge. 53 year old patient with a history of sickle cell anemia who was last seen by me with ulceration of [name] right lower extremity above [name] ankle [name] was referred to Dr. Marzetta Board for a surgical debridement as I was unable to do anything in [name] office due to excruciating pain. At that stage she was referred from [name] plastic surgery service to dermatology who treated her for a skin infection with doxycycline [name] then Levaquin [name] a local antibiotic ointment. I understand [name] patient has since developed ulceration on [name] left ankle both medial [name] lateral [name] was now referred back to [name] wound center as dermatology has finished [name] management. I do not have any notes from [name] dermatology department Old notes: 52 year old patient with a history of sickle cell anemia, pain bilateral  lower extremities, right lower extremity ulcer [name] has a history of receiving a skin graft( Theraskin) several months ago. She has been visiting [name] wound center [facility] [name] was seen by Dr. Leanord Hawking [name] Dr. Marzetta Board. after prolonged conservator therapy between July 2016 [name] January 2017. She had been seen by [name] plastic surgeon [name] taken to [name] OR for debridement [name] application of Theraskin. She had 3 applications of Theraskin [name] was then treated with collagen. Prior to that she had a history of similar problems in 2014 [name] was treated conservatively. Had a reflux study done for [name] right lower extremity in August 2016 without reflux or DVT . Past medical history significant for sickle cell disease, anemia, leg ulcers, cholelithiasis,[name] has never been a smoker. Once [name] patient was discharged on [name] wound center she says within 2 or 3 weeks [name] problems recurred [name] she has been treating it conservatively. since I saw her 3 weeks ago at [name] she has been unable to get her dressing material but has completed a course of doxycycline. 6/7/ 2017 -- lower extremity venous duplex reflux evaluation was done  No evidence of SVT or DVT in [name] RLL. No venous incompetence in [name] RLL. No further vascular workup is indicated at this time. She was seen by Dr. Mina Marble, on 10/04/2015. She agreed with [name] plan of taking her to [name] OR for debridement [name] application of theraskin [name] would also take biopsies to rule out pyoderma gangrenosum. Follow-up note dated May 31 received [name] she was status post application of Theraskin to multiple ulcers around [name] right ankle. Pathology did not show evidence of malignancy or pyoderma gangrenosum. She would continue to see as in [name] wound clinic for further care [name] see Dr. Marzetta Board as needed. [name] patient brought [name] biopsy report [name] it was consistent with stasis ulcer no evidence of malignancy [name] [name] comment was that there was some  adjacent neovascularization, fibrosis [name] patchy perivascular chronic inflammation. 11/15/2015 -- today we applied her first application of Theraskin 11/30/15; TheraSkin #2 12/13/2015 -- she is having a lot of pain locally [name] is here for possible application of a theraskin today. 01/16/2016 -- [name] patient has significant pain [name] has noticed despite in spite of all local care [name] oral pain medication. It is impossible to debride her in [name] [place], [place] ([id number]) [id number] Page 10 of 17  office. 02/06/2016 -- I do not see any notes from Dr. Leta Baptist( [name] patient has not made a call to [name] office know as she heard from them) [name] [name] only visit to recently was with her PCP Dr. Gypsy Decant -- I saw her on 01/16/2016 [name] prescribed 90 tablets of oxycodone 10 mg [name] did lab work [name] screening for HIV. [name] HIV was negative [name] hemoglobin was 6.3 with a WBC count of 14.9 [name] hematocrit of 17.8 with platelets of 561. reticulocyte count was 15.5% READMISSION: 07/10/2016- [name] patient is here for readmission for bilateral lower extremity ulcers in [name] presence of sickle cell. [name] bimalleolar ulcers to [name] right lower extremity have been present for approximately 2 years, [name] left medial malleolus ulcer has been present approximately 6 months. She has followed with Dr.Thimmappa in [name] past [name] has had a total of 3 applications of Theraskin (01/2015, 09/2015, 06/17/16). She has also followed with Dr. Meyer Russel here in [name] clinic [name] has received 2 applications of TheraSkin (11/10/15, 11/30/15). [name] patient does experience chronic, [name] is not amenable to debridement. 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 She is not currently on any antibiotic therapy [name] has not been treated with any recently. 07/17/2016 -- was seen by Dr. Leta Baptist of plastic surgery who saw her 2 weeks postop application of Theraskin #3. She had removed her dressing [name]  asked her to apply silver alginate on alternate days [name] follow-up back with [name] wound center. Future debridements [name] application of skin substitute would have to be done in [name] hospital due to her high risk for anesthesia. READMISSION 04/17/2021 Patient is now a 52 year old woman that we have had in this clinic for a prolonged period of time [name] 2016-2017 [name] then again for 2 visits in February 2018. At that point she had wounds on [name] right lower leg predominantly medial. She had also been seen by plastic surgery Dr. Marzetta Board who I believe took her to [name] OR for operative debridement [name] application of TheraSkin in 2017. After she left our clinic she was followed for a very prolonged period of time in [name] wound care center in [facility] who then referred her ultimately to [facility] where she was seen by Dr. Mardene Speak. Again taken her to [name] OR for skin grafting which apparently did not take. She had multiple other attempts at dressings although I have not really looked over all of these notes in great detail. She has not been seen in a wound care center in about a year. She states over [name] last year in addition to her right lower leg she has developed wounds on [name] left lower leg quite extensive. She is using Xeroform to all of these wounds without really any improvement. She also has Medicaid which does not cover wound products. [name] patient has had vascular work-ups in [name] past including most recently on 03/28/2021 showing biphasic waveforms on [name] right triphasic at [name] PTA [name] biphasic at [name] dorsalis pedis on [name] left. She was unable to tolerate any degree of compression to do ABIs. Unfortunately TBI's were also not done. She had venous reflux studies done in 2017. This did not show any evidence of a DVT or SVT [name] no venous incompetence was noted in [name] right leg at [name] time this was [name] only side with [name] wound As noted I did not look all over her old records. She apparently had a course of HBO  [name] [address]  although I am not sure what [name] indication would have been. In any case she developed seizures [name] terminated treatment earlier. She is generally much more disabled than when we last saw her in clinic. She can no longer walk pretty much wheelchair-bound because predominantly of pain in [name] left hip. 04/24/2021; [name] patient tolerated [name] wraps we put on. We used Santyl [name] Hydrofera Blue under compression. I brought her back for a nurse visit for a change in dressing. With Medicaid we will have a hard time getting anything paid for [name] hence [name] need for compression. She arrives in clinic with all [name] wounds looking somewhat better in terms of surface 12/20; circumferential wound on [name] right from [name] lateral to [name] medial. She has open areas on [name] left medial [name] left lateral x2 on all of this with [name] same surface. This does not look completely healthy although she does have some epithelialization. She is not complaining of a lot of pain which is unusual for her sickle ulcers. I have not looked over her extensive records from [facility]. She had recent arterial studies [name] has a history of venous reflux studies I will need to look these over although I do not believe she has significant arterial disease 2023 05/22/2021; patient's wound areas measure slightly smaller. Still a lot of drainage coming from [name] right we have been using Hydrofera Blue [name] Santyl with some improvement in [name] wound surfaces. She tells me she will be getting transfused later in [name] week for her underlying sickle cell anemia I have looked over her recent arterial studies which were done in [name] fall. This was in November [name] showed biphasic [name] triphasic waveforms but she could not tolerate ABIs because of pressure [name] unfortunately TBI's were not done. She has not had recent venous reflux studies that I can see 1/10; not much change about [name] same surface area. This has a yellowish surface to it very gritty. We have  been using Santyl [name] Hydrofera Blue for a prolonged period. Culture I did last week showed methicillin sensitive staph aureus "rare". Our intake nurse reports greenish drainage which may be [name] Hydrofera Blue itself 1/17; wounds are continue to measure smaller although I am not sure about [name] accuracy here. Especially [name] areas on [name] right are covered in what looks to be a nonviable surface although she does have some epithelialization. Similarly she has areas on [name] left medial [name] left lateral ankle area which appear to have a better surface [name] perhaps are slightly smaller. We have been using Santyl [name] Hydrofera Blue. She cannot tolerate mechanical debridements She went for her reflux studies which showed significant reflux at [name] greater saphenous vein at [name] saphenofemoral junction as well as [name] greater saphenous vein in [name] proximal calf on [name] left she had reflux in [name] thigh [name] [name] common femoral vein [name] supra vein Fishel vein reflux in [name] greater saphenous vein. I will have vein [name] vascular look at this. My thoughts have been that these are likely sickle wounds. I looked through her old records from [facility] wound care center [name] then when she graduated to [facility] wound care center where she saw Dr. Ronda Fairly [name] Dr. Mardene Speak. Although I can see she had reflux studies done I do not see that she actually saw a vein [name] vascular. I went over [name] fact that she had operative debridements [name] actual skin grafting that did not take. I do not think these wounds have ever really  progressed towards healing 1/31;Substantial wounds on [name] right ankle area. Hyper granulated very gritty adherent debris on [name] surface. 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 On [name] left she has no DVT or SVT deep vein reflux in [name] common femoral vein. Superficial vein reflux in [name] greater saphenous vein on although [name] vein diameter is not really all that large. I do not think there is anything that can be done with these although I am going to send her for consultation to vein [name] vascular. 2/7; Wound exam; substantial wound area on [name] right posterior ankle area [name] areas on [name] left medial ankle [name] left lateral ankle. I was able to debride [name] left medial ankle last week fairly aggressively [name] it is back this week to a completely nonviable surface She will see vascular surgery this Friday [name] I would like them to review [name] venous studies [name] also any comments on her arterial status. If they do not see an issue here I am going to refer her to plastic surgery for an operative debridement perhaps intraoperative ACell or Integra. Eventually she will require a deep tissue culture again 2/14; substantial wound area on [name] right posterior ankle, medial ankle. We have been using silver alginate [name] patient was seen by vein [name] vascular she had both venous reflux studies [name] arterial studies. In terms of [name] venous reflux studies she had a chronic DVT in [name] popliteal vein but no evidence of deep vein reflux. She had no evidence of superficial venous thrombosis. She did have superficial vein reflux at [name] saphenofemoral junction [name] [name] greater saphenous vein. On [name] left no evidence of a DVT no evidence of superficial venous throat thrombosis she did have deep vein reflux in [name] common femoral vein [name] superficial vein reflux in [name] greater saphenous vein but these were not felt to be amenable to ablation. MADDALYNN, HOWALD ([id number]) [id number] Page 11 of 17 In terms of arterial studies she had triphasic [name] wife  biphasic wave waveforms bilaterally not felt to have a significant arterial issue. I do not get [name] feeling that they felt that any part of her nonhealing wounds were related to either arterial or venous issues. They did note that she had venous reflux at [name] right at [name]. [name] also on [name] left there were reflux in [name] deep system at [name] common femoral vein [name] greater saphenous vein in [name] proximal thigh. Nothing amenable to ablation. 2/20; she is making some decent progress on [name] right where there is nice skin between [name] 2 open areas on [name] right ankle. [name] surfaces here do not look viable yet there is some surrounding epithelialization. She still has a small area on [name] left medial ankle area. Hyper-granulated Jody's away always 2/28 patient has an appointment with plastic surgery on 3/8. We will see her back on 3/9. She may have to call us to get [name] area redressed. We've been using Santyl under silver alginate. We made a nice improvement on [name] left medial ankle. [name] larger wounds on [name] right also looks somewhat better in terms of epithelialization although I think  they could benefit from an aggressive debridement if plastic surgery would be willing to do that. Perhaps placement of Integra or a cell 07/26/2021: She saw Dr. Arita Miss yesterday. He raised [name] question as to whether or not this might be pyoderma [name] wanted to wait until that question was answered by dermatology before proceeding with any sort of operative debridement. We have continue to use Santyl under silver alginate with Kerlix [name] Coban wraps. Overall, her wounds appear to be continuing to contract [name] epithelialize, with some granulation tissue present. There continues to be some slough on all wound surfaces. 08/09/2021: She has not been able to get an appointment with dermatology because apparently [name] offices in [facility] [name] do not accept Medicaid. She is looking into whether or not she can be seen at [name]  main [facility] dermatology clinic. This is necessary because plastic surgery is concerned that her wounds might represent pyoderma [name] they did not want to do any procedure until that was clarified. We have been using Santyl under silver alginate with Kerlix [name] Coban wraps. Today, there was a greater amount of drainage on her dressings with a slight green discoloration [name] significant odor. Despite this, her wounds continue to contract [name] epithelialize. There is pale granulation tissue present [name] actually, on [name] left medial ankle, [name] granulation tissue is a bit hypertrophic. 08/16/2021: Last week, I took a culture [name] this grew back rare methicillin-resistant Staph aureus [name] rare corynebacterium. [name] MRSA was sensitive to gentamicin which we began applying topically on an empiric basis. This week, her wounds are a bit smaller [name] [name] drainage [name] odor are less. Her primary care provider is working on assisting [name] patient with a dermatology evaluation. She has been in silver alginate over [name] gentamicin that was started last week along with Kerlix [name] Coban wraps. 08/23/2021: Because she has Medicaid, we have been unable to get her into see any dermatologist in [name] to rule out pyoderma gangrenosum, which was a requirement from plastic surgery prior to any sort of debridement [name] grafting. Despite this, however, all of her wounds continue to get smaller. [name] wound on her left medial ankle is nearly closed. There is no odor from [name] wounds, although she still accumulates a modest amount of drainage on her dressings. 08/30/2021: [name] lateral right ankle wound [name] [name] medial left ankle wound are a bit smaller today. [name] medial right ankle wound is about [name] same size. They are less tender. We have still been unable to get her into dermatology. 09/06/2021: All of [name] wounds are about [name] same size today. She continues to endorse minimal pain. I communicated with Dr. Arita Miss in plastic surgery  regarding our issues getting a dermatology appointment; he was out of town but indicated that he would look into perhaps performing [name] biopsy in his office [name] will have his office contact her. 09/14/2021: [name] patient has an appointment in dermatology, but it is not until October. Her wounds are roughly [name] same; she continues to have very thick purulent-looking drainage on her dressings. 09/20/2021: [name] left medial wound is nearly closed [name] just has a bit of accumulated eschar on [name] surface. [name] right medial [name] lateral ankle wounds are perhaps a little bit smaller. They continue to have a very pale surface with accumulation of thin slough. 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 I did not think [name] was indicated based on this. She is getting topical mupirocin with Prisma  silver collagen. 10/04/2021: [name] patient was not seen in clinic last week due to childcare coverage issues. In [name] interim, [name] left medial leg wound has closed. [name] right sided leg wounds are smaller. There is more granulation tissue coming through, particularly on [name] lateral wound. [name] surface remains somewhat gritty. We have been applying topical mupirocin [name] Prisma silver collagen. 10/11/2021: [name] left medial leg wound remains closed. She does complain of some anesthetic sensation to [name] area. Both of [name] right-sided leg wounds are smaller but still have accumulated slough. 10/18/2021: Both right-sided leg wounds are minimally smaller this week. She still continues to accumulate slough [name] has thick drainage on her dressings. 10/23/2021: Both wounds continue to contract. There is still slough buildup. She has been approved for a keratin-based skin substitute trial product but it will not be available until next week. 10/30/2021: [name] wounds are about [name] same to perhaps slightly smaller. There is still continued slough buildup. Unfortunately, [name] rep for [name] keratin based product did not show up  today [name] did not answer his phone when called. 11/08/2021: [name] wounds are little bit smaller today. She continues to have thick drainage but [name] surfaces are relatively clean with just a little bit of slough accumulation. She reported to me today that she is unable to completely flex her left ankle [name] on examination it seems this is potentially related to scar tissue from her wounds. We do have [name] ProgenaMatrix trial product available for her today. 11/15/2021: Both wounds are smaller today. There is some slough accumulation on [name] surfaces, but [name] medial wound, in particular looks like it is filling in [name] is less deep. She did hear from physical therapy [name] she is going to start working with them on July 11. She is here for her second application of [name] trial skin substitute, ProgenaMatrix. 11/22/2021: Both wounds continue to contract, [name] medial more dramatically than [name] lateral. Both wounds have a layer of slough on [name] surface, but underneath this, [name] gritty fibrous tissue has a little bit more of a pink cast to it rather than being as pale as it has been. 11/29/2021: [name] wounds are roughly [name] same size this week, perhaps a millimeter or 2 smaller. [name] medial wound has filled in [name] is nearly flush with [name] surrounding skin surface. She continues to have a lot of slough accumulation on both surfaces. 12/06/2021: No significant change to her wounds, but she has a new opening on her dorsal foot, just distal to [name] right lateral ankle wound. [name] area on her left medial ankle that reopened looks a little bit larger today. She has quite a bit of pain associated with [name] new wound. 12/12/2021: Her wounds look about [name] same but [name] new opening on her right lateral dorsal foot is a little bit bigger. She continues to have a fair amount of pain with this wound. 12/26/2021: [name] left medial ankle wound is tiny [name] superficial. She has 2 areas of crusting on her left lateral ankle, however, that appear to be  threatening to open again. Her right medial ankle wound is a little bit smaller today but still continues to accumulate thick rubbery slough. [name] new dorsal foot wound is exquisitely painful but there is no odor or purulent drainage. No erythema or induration. [name] right lateral ankle wound looks about [name] same today, again with thick rubbery slough. 01/03/2022: [name] left medial ankle wound has closed again. Both right ankle wounds appear to be about [name] same size with  thick rubbery slough. [name] dorsal foot wound on [name] right continues to be quite painful [name] she stated that she did not want any debridement of that site today. 01/10/2022: No real change to any of her wounds. She continues to accumulate thick slough. [name] dorsal foot wound has merged with [name] lateral malleolar wound. SHAUNICE, FOSNIGHT ([id number]) [id number] Page 12 of 17 She is experiencing significant pain in [name] dorsal foot portion of [name] ulcer. 01/16/2022: Absolutely no change or progress in her wounds. 01/24/2022: Her wounds are unchanged. She continues to build up slough [name] [name] wounds on her dorsal right foot are still exquisitely tender. 01/31/2022: [name] wounds actually measure a little bit narrower today. They still have thick slough on [name] surface but [name] underlying tissue seems a little less fibrotic. We changed to Iodosorb last week. 02/07/2022: Wounds continue to slowly epithelialize around [name] parameters. She has less pain today. She still accumulates a fairly substantial layer of slough. 02/15/2022: No change to [name] wounds overall. [name] measured a little bit larger today per [name] intake nurse. She continues to have substantial slough accumulation [name] her pain is a little bit worse. 02/21/2022. No change at all to her wounds. I do not see that plastic surgery has received her referral yet. 02/28/2022: [name] wounds remain unchanged. They are dry [name] fibrotic with accumulation of slough [name] eschar. She did receive  an appointment to see plastic surgery on October 23, but [name] office called her back [name] indicated they needed to reschedule [name] that [name] next available appointment was not until December. [name] patient became angry [name] decided she did not want to see this plastics group. 10/19; [name] patient sees Dr. Maylene Roes again next week. She is using Medihoney as [name] primary dressing changing this herself. 03/21/2022: She saw Dr. Ferd Hibbs at [name] wound care center at [facility]. Dr. Ferd Hibbs was also unable to debride her, secondary to pain [name] is planning to take her to [name] operating room for operative debridement [name] potential skin substitute placement. Her wounds are unchanged with thick yellow slough [name] a fibrotic base. She says they are too painful to be debrided. In addition, yesterday her hemoglobin was 4 [name] she received 2 units packed red blood cell transfusion. 04/04/2022: Her operation at [facility] is scheduled for December 15. She continues to use Medihoney on her wounds. They are a little bit less painful today [name] she is willing to entertain [name] possibility of debridement. [name] wounds measured a little bit larger in all dimensions today but [name] layer of slough is not as thick as usual. 04/18/2022: Her wounds actually measured a little bit smaller today [name] [name] extension onto [name] dorsal foot from [name] lateral wound has healed. They are less tender [name] there is significantly less slough present as compared to prior visits. 05/09/2022: She had to reschedule her surgery due to lack of childcare. It is now planned for January 5. [name] wounds are basically unchanged, but she had a lot more drainage, which was thick [name] somewhat purulent in appearance. No significant odor. 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 They are quite painful today [name] she does not want to have debridement performed. 05/30/2022: [name] patient underwent surgical debridement [name] placement of TheraSkin to  her wounds on January 5. She has been doing well [name] does not endorse any pain. [name] TheraSkin is intact [name] looks beautiful. It is sutured in place. There is no exudate or significant drainage. 06/04/2022: Her TheraSkin remains intact  [name] I am starting to see granulation tissue emerging underneath [name] surface. No concern for infection. 06/12/2022: [name] TheraSkin is intact [name] there is more granulation tissue emerging. It is starting to look a little bit dry, however. No malodor or purulent drainage. 06/19/2022: [name] TheraSkin continues to look good. [name] edges are a bit dry, but [name] central portion of each of her wounds is showing a nice pink color. 06/27/2022: [name] TheraSkin on [name] lateral wound remains almost completely intact. There is nice pink tissue peaking through [name] mesh of [name] TheraSkin. On [name] medial leg, it is starting to breakdown a little bit, but most of [name] TheraSkin remains intact here, as well. Both wounds measured smaller today. 07/03/2022: [name] wounds are fairly dry around [name] perimeter today. Some of [name] suture is hanging loose. 07/12/2022: Both wounds are measuring smaller today. [name] medial ulcer is getting a bit too dry. [name] lateral ulcer continues to have nice buds of granulation tissue emerging. 07/26/2022: [name] moisture balance on both wounds is much better today. For [name] first time since I began seeing her, I see actual beefy red granulation tissue on [name] lateral ankle wound. There are more buds of deep pink granulation tissue emerging on [name] medial ankle. There is some dry eschar around [name] edges of [name] medial wound [name] a tiny amount of slough overlying [name] good granulation tissue on [name] lateral wound. 08/09/2022: Unfortunately, she has a new wound on her left lateral lower leg, just above [name] ankle. It is extremely painful for her. It penetrates to [name] fat layer [name] has [name] same woody, fibrotic character as her previous wounds. [name] other wounds are both a little bit smaller with slough [name]  some eschar around [name] periphery. 08/14/2022: Her right lateral ankle wound is a little bit smaller. Unfortunately, she has had an increase in her drainage [name] it has a purulent nature to it, similar to what was present prior to her surgical debridement. She also reports odor coming from her wounds this week. [name] left lateral leg wound remains extremely tender. 08/21/2022: [name] left lateral ankle wound is larger [name] more painful today. Both right ankle wounds have a little bit more vital appearance, but they have accumulated more slough. She reports less odor this week. She is currently taking Augmentin. 08/28/2022: [name] left lateral ankle wound continues to enlarge [name] remains quite painful. Both of [name] right ankle wounds actually look better this week. There is improved tissue quality with less slough accumulation. 09/27/2022: [name] left lateral ankle wound actually looks nearly healed. She says that she has been applying some silver alginate that she had leftover. She was unable to afford [name] Santyl that was prescribed, but found a tube that she had on hand at home [name] has been using this on her right ankle wounds. These wounds look about [name] same [name] have accumulated [name] usual layer of slough. Unfortunately, a portion of [name] dorsal foot wound has reopened [name] is exquisitely painful today. 10/09/2022: [name] left lateral ankle wound is healed. [name] dorsal foot wound appears to have expanded [name] remains exquisitely painful. [name] medial [name] lateral right ankle wounds look about [name] same size, but they are quite a bit cleaner. 10/23/2022: [name] left lateral ankle remains closed. [name] dorsal foot wound has expanded to [name] point that it now converges with [name] right lateral leg wound. She has accumulated fairly thick slough on all of [name] open surfaces. 11/06/2022: No significant change to any of her wounds, although [name] dorsal foot  aspect of [name] right lateral wound is a little bit smaller. She has been approved for [name]  [employer] [name] [employer] for [name] [employer] [name] will be able to get a full year's supply. Patient History Information obtained from Patient. Family History [name], [name] ([id number]) [id number] Page 13 of 17 Diabetes - Mother, Lung Disease - Mother, No family history of Cancer, Heart Disease, Hereditary Spherocytosis, Hypertension, Kidney Disease, Seizures, Stroke, Thyroid Problems, Tuberculosis. Social History Never smoker, Marital Status - Married, Alcohol Use - Never, Drug Use - No History, Caffeine Use - Daily. Medical History Eyes Denies history of Cataracts, Glaucoma, Optic Neuritis Ear/Nose/Mouth/Throat Denies history of Chronic sinus problems/congestion, Middle ear problems Hematologic/Lymphatic Patient has history of Anemia, Sickle Cell Disease Denies history of Hemophilia, Human Immunodeficiency Virus, Lymphedema Respiratory Denies history of Aspiration, Asthma, Chronic Obstructive Pulmonary Disease (COPD), Pneumothorax, Sleep Apnea, Tuberculosis Cardiovascular Denies history of Angina, Arrhythmia, Congestive Heart Failure, Coronary Artery Disease, Deep Vein Thrombosis, Hypertension, Hypotension, Myocardial Infarction, Peripheral Arterial Disease, Peripheral Venous Disease, Phlebitis, Vasculitis Gastrointestinal Denies history of Cirrhosis , Colitis, Crohns, Hepatitis A, Hepatitis B, Hepatitis C Endocrine Denies history of Type I Diabetes, Type II Diabetes Genitourinary Denies history of End Stage Renal Disease Immunological Denies history of Lupus Erythematosus, Raynauds, Scleroderma Integumentary (Skin) Denies history of History of Burn Musculoskeletal Denies history of Gout, Rheumatoid Arthritis, Osteoarthritis, Osteomyelitis Neurologic Patient has history of Neuropathy - right foot intermittant Denies history of Dementia, Quadriplegia, Paraplegia, Seizure Disorder Oncologic Denies history of Received Chemotherapy, Received  Radiation Psychiatric Denies history of Anorexia/bulimia, Confinement Anxiety Hospitalization/Surgery History - c section x2. - left breast lumpectomy. - iandD right ankle with theraskin. Medical A Surgical History Notes nd Constitutional Symptoms (General Health) H/O miscarriage Cardiovascular bradycardia Gastrointestinal cholilithiasis Objective Constitutional no acute distress. Vitals Time Taken: 10:37 AM, Height: 67 in, Weight: 134 lbs, BMI: 21, Temperature: 98.1 F, Pulse: 71 bpm, Respiratory Rate: 18 breaths/min, Blood Pressure: 136/66 mmHg. Respiratory Normal work of breathing on room air. General Notes: 11/06/2022: No significant change to any of her wounds, although [name] dorsal foot aspect of [name] right lateral wound is a little bit smaller. Integumentary (Hair, Skin) Wound #17 status is Open. Original cause of wound was Gradually Appeared. [name] date acquired was: 10/05/2012. [name] wound has been in treatment 81 weeks. [name] wound is located on [name] Right,Lateral Lower Leg. [name] wound measures 10.5cm length x 5.8cm width x 0.1cm depth; 47.831cm^2 area [name] 4.783cm^3 volume. There is Fat Layer (Subcutaneous Tissue) exposed. There is no tunneling or undermining noted. There is a large amount of serosanguineous drainage noted. [name] wound margin is distinct with [name] outline attached to [name] wound base. There is small (1-33%) pink granulation within [name] wound bed. There is a large (67-100%) amount of necrotic tissue within [name] wound bed including Adherent Slough. [name] periwound skin appearance had no abnormalities noted for moisture. [name] periwound skin appearance exhibited: Scarring, Hemosiderin Staining. [name] periwound skin appearance did not exhibit: Ecchymosis. Periwound temperature was noted as No Abnormality. [name] periwound has tenderness on palpation. Wound #21 status is Open. Original cause of wound was Gradually Appeared. [name] date acquired was: 06/26/2021. [name] wound has been in treatment 71  weeks. [name] wound is located on [name] Right,Medial Ankle. [name] wound measures 8cm length x 3.8cm width x 0.1cm depth; 23.876cm^2 area [name] 2.388cm^3 volume. There is Fat Layer (Subcutaneous Tissue) exposed. There is no tunneling or undermining noted. There is a medium amount of serosanguineous drainage noted. [name] wound margin is distinct with [name] outline attached  to [name] wound base. There is small (1-33%) pink granulation within [name] wound bed. There is a large (67-100%) amount of necrotic tissue within [name] wound bed including Adherent Slough. [name] periwound skin appearance had no abnormalities noted for moisture. [name] periwound skin appearance had no abnormalities noted for color. [name] periwound skin appearance exhibited: Scarring. Periwound temperature was noted as No Abnormality. [name] periwound has tenderness on palpation. [name], [name] ([id number]) [id number] Page 14 of 17 Assessment Active Problems ICD-10 Non-pressure chronic ulcer of other part of right lower leg with other specified severity Sickle-cell disease without crisis Procedures Wound #17 Pre-procedure diagnosis of Wound #17 is a Sickle Cell Lesion located on [name] Right,Lateral Lower Leg . There was a Selective/Open Wound Non-Viable Tissue Debridement with a total area of 2.39 sq cm performed by Duanne Guess, MD. With [name] following instrument(s): Curette to remove Non-Viable tissue/material. Material removed includes [facility] after achieving pain control using Lidocaine 4% Topical Solution. No specimens were taken. A time out was conducted at 10:55, prior to [name] start of [name] procedure. A Minimum amount of bleeding was controlled with Pressure. [name] procedure was tolerated well with a pain level of 5 throughout [name] a pain level of 4 following [name] procedure. Post Debridement Measurements: 10.5cm length x 5.8cm width x 0.1cm depth; 4.783cm^3 volume. Character of Wound/Ulcer Post Debridement is stable. Post  procedure Diagnosis Wound #17: Same as Pre-Procedure General Notes: Scribed for Dr Lady Gary by Zenaida Deed, RN. Wound #21 Pre-procedure diagnosis of Wound #21 is a Sickle Cell Lesion located on [name] Right,Medial Ankle .Severity of Tissue Pre Debridement is: Fat layer exposed. There was a Selective/Open Wound Non-Viable Tissue Debridement with a total area of 23.86 sq cm performed by Duanne Guess, MD. With [name] following instrument(s): Curette to remove Non-Viable tissue/material. Material removed includes [facility] after achieving pain control using Lidocaine 4% Topical Solution. No specimens were taken. A time out was conducted at 10:55, prior to [name] start of [name] procedure. A Minimum amount of bleeding was controlled with Pressure. [name] procedure was tolerated well with a pain level of 5 throughout [name] a pain level of 4 following [name] procedure. Post Debridement Measurements: 8cm length x 3.8cm width x 0.1cm depth; 2.388cm^3 volume. Character of Wound/Ulcer Post Debridement is stable. Severity of Tissue Post Debridement is: Fat layer exposed. Post procedure Diagnosis Wound #21: Same as Pre-Procedure General Notes: Scribed for Dr Lady Gary by Zenaida Deed, RN. Plan Follow-up Appointments: Return Appointment in 2 weeks. - Dr. Lady Gary Rm 2 Anesthetic: (In clinic) Topical Lidocaine 5% applied to wound bed Bathing/ Shower/ Hygiene: May shower [name] wash wound with soap [name] water. Edema Control - Lymphedema / SCD / Other: Elevate legs to [name] level of [name] heart or above for 30 minutes daily [name]/or when sitting for 3-4 times a day throughout [name] day. Avoid standing for long periods of time. Exercise regularly Additional Orders / Instructions: Follow Nutritious Diet Juven Shake 1-2 times daily. WOUND #17: - Lower Leg Wound Laterality: Right, Lateral Cleanser: Soap [name] Water 1 x Per Week/30 Days Discharge Instructions: May shower [name] wash wound with dial antibacterial soap [name] water prior to dressing  change. Cleanser: Wound Cleanser 1 x Per Week/30 Days Discharge Instructions: Cleanse [name] wound with wound cleanser prior to applying a clean dressing using gauze sponges, not tissue or cotton balls. Peri-Wound Care: Triamcinolone 15 (g) 1 x Per Week/30 Days Discharge Instructions: Use triamcinolone 15 (g) as directed Peri-Wound Care: Sween Lotion (Moisturizing lotion) 1 x Per Week/30 Days Discharge Instructions: Apply  moisturizing lotion as directed Prim Dressing: MediHoney Gel, tube 1.5 (oz) 1 x Per Week/30 Days ary Discharge Instructions: Apply to wound bed in clinic if santyl not available Prim Dressing: Santyl Ointment 1 x Per Week/30 Days ary Discharge Instructions: Apply nickel thick amount to wound bed as instructed Secondary Dressing: ABD Pad, 5x9 1 x Per Week/30 Days Discharge Instructions: Apply over primary dressing as directed. Secondary Dressing: Woven Gauze Sponge, Non-Sterile 4x4 in 1 x Per Week/30 Days Discharge Instructions: Moisten with normal saline [name] Apply over primary dressing as directed. Secured With: Elastic Bandage 4 inch (ACE bandage) 1 x Per Week/30 Days Discharge Instructions: Secure with ACE bandage as directed. Secured With: [employer], 4.5x3.1 (in/yd) 1 x Per Week/30 Days Discharge Instructions: Secure with Kerlix as directed. WOUND #21: - Ankle Wound Laterality: Right, Medial Cleanser: Soap [name] Water 1 x Per Week/30 Days Discharge Instructions: May shower [name] wash wound with dial antibacterial soap [name] water prior to dressing change. Cleanser: Wound Cleanser 1 x Per Week/30 Days Discharge Instructions: Cleanse [name] wound with wound cleanser prior to applying a clean dressing using gauze sponges, not tissue or cotton balls. Peri-Wound Care: Triamcinolone 15 (g) 1 x Per Week/30 Days Discharge Instructions: Use triamcinolone 15 (g) as directed Peri-Wound Care: Sween Lotion (Moisturizing lotion) 1 x Per Week/30 Days Discharge Instructions: Apply  moisturizing lotion as directed [name], [name] ([id number]) [id number] Page 15 of 17 Prim Dressing: MediHoney Gel, tube 1.5 (oz) 1 x Per Week/30 Days ary Discharge Instructions: Apply to wound bed in clinic if santyl not available Prim Dressing: Santyl Ointment 1 x Per Week/30 Days ary Discharge Instructions: Apply nickel thick amount to wound bed as instructed Secondary Dressing: ABD Pad, 5x9 1 x Per Week/30 Days Discharge Instructions: Apply over primary dressing as directed. Secondary Dressing: Woven Gauze Sponge, Non-Sterile 4x4 in 1 x Per Week/30 Days Discharge Instructions: Moisten with normal saline [name] Apply over primary dressing as directed. Secured With: Elastic Bandage 4 inch (ACE bandage) 1 x Per Week/30 Days Discharge Instructions: Secure with ACE bandage as directed. Secured With: [employer], 4.5x3.1 (in/yd) 1 x Per Week/30 Days Discharge Instructions: Secure with Kerlix as directed. 11/06/2022: No significant change to any of her wounds, although [name] dorsal foot aspect of [name] right lateral wound is a little bit smaller. I used a curette to debride slough off of her wounds. I was able to debride 100% of [name] medial ankle wound, but only about 5% of [name] lateral ankle wound before she asked me to stop secondary to pain. We really do not have many options for her so we will continue to use Santyl to both sites. Wewere out of [employer] in clinic today so we applied Medihoney but she will continue to use her Santyl supply at home. Follow-up in 2 weeks. Electronic Signature(s) Signed: 11/06/2022 11:20:30 AM By: Duanne Guess MD FACS Entered By: Duanne Guess on 11/06/2022 11:20:30 -------------------------------------------------------------------------------- HxROS Details Patient Name: Date of Service: [name] [name], [name] 11/06/2022 9:45 A M Medical Record Number: [id number] Patient Account Number: [id number] Date of Birth/Sex: Treating  RN: March 09, 1971 (52 y.o. F) Primary Care Provider: Julianne Handler Other Clinician: Referring Provider: Treating Provider/Extender: Koleen Nimrod Weeks in Treatment: 72 Information Obtained From Patient Constitutional Symptoms (General Health) Medical History: Past Medical History Notes: H/O miscarriage Eyes Medical History: Negative for: Cataracts; Glaucoma; Optic Neuritis Ear/Nose/Mouth/Throat Medical History: Negative for: Chronic sinus problems/congestion; Middle ear problems Hematologic/Lymphatic Medical History: Positive for: Anemia; Sickle Cell Disease  Negative for: Hemophilia; Human Immunodeficiency Virus; Lymphedema Respiratory Medical History: Negative for: Aspiration; Asthma; Chronic Obstructive Pulmonary Disease (COPD); Pneumothorax; Sleep Apnea; Tuberculosis Cardiovascular Medical History: Negative for: Angina; Arrhythmia; Congestive Heart Failure; Coronary Artery Disease; Deep Vein Thrombosis; Hypertension; Hypotension; Myocardial Infarction; Peripheral Arterial Disease; Peripheral Venous Disease; Phlebitis; Vasculitis ANTANIQUE, [name] ([id number]) [id number] Page 16 of 17 Past Medical History Notes: bradycardia Gastrointestinal Medical History: Negative for: Cirrhosis ; Colitis; Crohns; Hepatitis A; Hepatitis B; Hepatitis C Past Medical History Notes: cholilithiasis Endocrine Medical History: Negative for: Type I Diabetes; Type II Diabetes Genitourinary Medical History: Negative for: End Stage Renal Disease Immunological Medical History: Negative for: Lupus Erythematosus; Raynauds; Scleroderma Integumentary (Skin) Medical History: Negative for: History of Burn Musculoskeletal Medical History: Negative for: Gout; Rheumatoid Arthritis; Osteoarthritis; Osteomyelitis Neurologic Medical History: Positive for: Neuropathy - right foot intermittant Negative for: Dementia; Quadriplegia; Paraplegia; Seizure  Disorder Oncologic Medical History: Negative for: Received Chemotherapy; Received Radiation Psychiatric Medical History: Negative for: Anorexia/bulimia; Confinement Anxiety Immunizations Pneumococcal Vaccine: Received Pneumococcal Vaccination: No Implantable Devices None Hospitalization / Surgery History Type of Hospitalization/Surgery c section x2 left breast lumpectomy iandD right ankle with theraskin Family [name] Social History Cancer: No; Diabetes: Yes - Mother; Heart Disease: No; Hereditary Spherocytosis: No; Hypertension: No; Kidney Disease: No; Lung Disease: Yes - Mother; Seizures: No; Stroke: No; Thyroid Problems: No; Tuberculosis: No; Never smoker; Marital Status - Married; Alcohol Use: Never; Drug Use: No History; Caffeine Use: Daily; Financial Concerns: No; Food, Clothing or Shelter Needs: No; Support System Lacking: No; Transportation Concerns: No Electronic Signature(s) Signed: 11/06/2022 11:39:54 AM By: Duanne Guess MD FACS Entered By: Duanne Guess on 11/06/2022 11:18:54 [name] ([id number]) [id number] Page 17 of 17 -------------------------------------------------------------------------------- SuperBill Details Patient Name: Date of Service: Doneen Poisson, [place] [name] 11/06/2022 Medical Record Number: [id number] Patient Account Number: [id number] Date of Birth/Sex: Treating RN: 04-22-1971 (52 y.o. F) Primary Care Provider: Julianne Handler Other Clinician: Referring Provider: Treating Provider/Extender: Koleen Nimrod Weeks in Treatment: 37 Diagnosis Coding ICD-10 Codes Code Description L97.818 Non-pressure chronic ulcer of other part of right lower leg with other specified severity 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 Facility Procedures : CPT4 Code: [id number] Description: 7597 - DEBRIDE WOUND 1ST 20 SQ CM OR < ICD-10 Diagnosis Description L97.818 Non-pressure chronic ulcer of other part of right lower  leg with other specified s Modifier: everity Quantity: 1 : CPT4 Code: [id number] Description: 7598 - DEBRIDE WOUND EA ADDL 20 SQ CM ICD-10 Diagnosis Description L97.818 Non-pressure chronic ulcer of other part of right lower leg with other specified s Modifier: everity Quantity: 1 Physician Procedures : CPT4 Code Description Modifier [id number] - WC PHYS LEVEL 4 - EST PT 25 ICD-10 Diagnosis Description L97.818 Non-pressure chronic ulcer of other part of right lower leg with other specified severity 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 Quantity: 1 : [id number] 97597 - WC PHYS DEBR WO ANESTH 20 SQ CM ICD-10 Diagnosis Description L97.818 Non-pressure chronic ulcer of other part of right lower leg with other specified severity Quantity: 1 : [id number] - WC PHYS DEBR WO ANESTH EA ADD 20 CM ICD-10 Diagnosis Description L97.818 Non-pressure chronic ulcer of other part of right lower leg with other specified severity Quantity: 1 Electronic Signature(s) Signed: 11/06/2022 11:20:48 AM By: Duanne Guess MD FACS Entered By: Duanne Guess on 11/06/2022 11:20:47

## 2022-11-20 ENCOUNTER — Encounter (HOSPITAL_BASED_OUTPATIENT_CLINIC_OR_DEPARTMENT_OTHER): Payer: Medicaid Other | Attending: General Surgery | Admitting: General Surgery

## 2022-11-20 DIAGNOSIS — Z86718 Personal history of other venous thrombosis and embolism: Secondary | ICD-10-CM | POA: Insufficient documentation

## 2022-11-20 DIAGNOSIS — L97818 Non-pressure chronic ulcer of other part of right lower leg with other specified severity: Secondary | ICD-10-CM | POA: Diagnosis not present

## 2022-11-20 DIAGNOSIS — D571 Sickle-cell disease without crisis: Secondary | ICD-10-CM | POA: Diagnosis present

## 2022-11-20 DIAGNOSIS — Z993 Dependence on wheelchair: Secondary | ICD-10-CM | POA: Diagnosis not present

## 2022-11-20 NOTE — Progress Notes (Signed)
DEWAYNE, BURLAND ([id number]) [id number] Page 1 of 9 Visit Report for 11/20/2022 Arrival Information Details Patient Name: Date of Service: [name] [name] 11/20/2022 8:30 A M Medical Record Number: [id number] Patient Account Number: [id number] Date of Birth/Sex: Treating RN: [birth year] (52 y.o. F) Primary Care Wenceslaus Gist: Julianne Handler Other Clinician: Referring Traevon Meiring: Treating Osiel Stick/Extender: Koleen Nimrod Weeks in Treatment: 15 Visit Information History Since Last Visit All ordered tests and consults were completed: No Patient Arrived: [name] Added or deleted any medications: No Arrival Time: 08:47 Any new allergies or adverse reactions: No Accompanied By: self Had a fall or experienced change in No Transfer Assistance: None activities of daily living that may affect Patient Identification Verified: Yes risk of falls: Secondary Verification Process Completed: Yes Signs or symptoms of abuse/neglect since last visito No Patient Requires Transmission-Based Precautions: No Hospitalized since last visit: No Patient Has Alerts: No Implantable device outside of the clinic excluding No cellular tissue based products placed in the center since last visit: Pain Present Now: No Electronic Signature(s) Signed: 11/20/2022 11:50:33 AM By: Dayton Scrape Entered By: Dayton Scrape on 11/20/2022 08:47:41 -------------------------------------------------------------------------------- Encounter Discharge Information Details Patient Name: Date of Service: [name], [name] 11/20/2022 8:30 A M Medical Record Number: [id number] Patient Account Number: [id number] Date of Birth/Sex: Treating RN: [birth year] (52 y.o. [name] Primary Care Emonii Wienke: Julianne Handler Other Clinician: Referring Fronia Depass: Treating Zaida Reiland/Extender: Koleen Nimrod Weeks in Treatment: 62 Encounter Discharge Information Items Post Procedure  Vitals Discharge Condition: Stable Temperature (F): 97.8 Ambulatory Status: Cane Pulse (bpm): 68 Discharge Destination: Home Respiratory Rate (breaths/min): 20 Transportation: Private Auto Blood Pressure (mmHg): 113/70 Accompanied By: self Schedule Follow-up Appointment: Yes Clinical Summary of Care: Patient Declined Electronic Signature(s) Signed: 11/20/2022 4:16:02 PM By: Samuella Bruin Entered By: Samuella Bruin on 11/20/2022 10:02:06 [name] ([id number]) [id number] Page 2 of 9 -------------------------------------------------------------------------------- Lower Extremity Assessment Details Patient Name: Date of Service: [name] [name] 11/20/2022 8:30 A M Medical Record Number: [id number] Patient Account Number: [id number] Date of Birth/Sex: Treating RN: 1970-11-17 (52 y.o. [name] Primary Care Arlena Marsan: Julianne Handler Other Clinician: Referring Joss Friedel: Treating Rashied Corallo/Extender: Koleen Nimrod Weeks in Treatment: 75 Edema Assessment Assessed: [Left: No] [Right: No] Edema: [Left: N] [Right: o] Calf Left: Right: Point of Measurement: 33 cm From Medial Instep 30.8 cm Ankle Left: Right: Point of Measurement: 10 cm From Medial Instep 19.4 cm Vascular Assessment Pulses: Dorsalis Pedis Palpable: [Right:Yes] Electronic Signature(s) Signed: 11/20/2022 4:16:02 PM By: Samuella Bruin Entered By: Samuella Bruin on 11/20/2022 08:51:51 -------------------------------------------------------------------------------- Multi Wound Chart Details Patient Name: Date of Service: [name], [name] 11/20/2022 8:30 A M Medical Record Number: [id number] Patient Account Number: [id number] Date of Birth/Sex: Treating RN: 11/25/1970 (52 y.o. F) Primary Care Addisson Frate: Julianne Handler Other Clinician: Referring Chitara Clonch: Treating Arisa Congleton/Extender: Koleen Nimrod Weeks in Treatment: 75 Vital  Signs Height(in): 67 Pulse(bpm): 68 Weight(lbs): 134 Blood Pressure(mmHg): 113/70 Body Mass Index(BMI): 21 Temperature(F): 97.8 Respiratory Rate(breaths/min): 20 [17:Photos:] [N/A:N/A [id number] Page 3 of 9] Right, Lateral Lower Leg Right, Medial Ankle N/A Wound Location: Gradually Appeared Gradually Appeared N/A Wounding Event: Sickle Cell Lesion Sickle Cell Lesion N/A Primary Etiology: N/A Venous Leg Ulcer N/A Secondary Etiology: Anemia, Sickle Cell Disease, Anemia, Sickle Cell Disease, N/A Comorbid History: Neuropathy Neuropathy 10/05/2012 06/26/2021 N/A Date Acquired: 34 73 N/A Weeks of Treatment: Open Open N/A Wound Status: No No N/A Wound Recurrence: Yes No N/A Clustered Wound: 7.4x7x0.1 8.2x3.6x0.1 N/A Measurements L x  W x D (cm) 40.684 23.185 N/A A (cm) : rea 4.068 2.318 N/A Volume (cm) : 78.30% 20.00% N/A % Reduction in A rea: 78.40% 20.00% N/A % Reduction in Volume: Full Thickness Without Exposed Full Thickness Without Exposed N/A Classification: Support Structures Support Structures Medium Medium N/A Exudate A mount: Serosanguineous Serosanguineous N/A Exudate Type: red, brown red, brown N/A Exudate Color: Distinct, outline attached Distinct, outline attached N/A Wound Margin: Small (1-33%) Small (1-33%) N/A Granulation A mount: Pink Pink N/A Granulation Quality: Large (67-100%) Large (67-100%) N/A Necrotic A mount: Fat Layer (Subcutaneous Tissue): Yes Fat Layer (Subcutaneous Tissue): Yes N/A Exposed Structures: Fascia: No Fascia: No Tendon: No Tendon: No Muscle: No Muscle: No Joint: No Joint: No Bone: No Bone: No Small (1-33%) Small (1-33%) N/A Epithelialization: Debridement - Selective/Open Wound Debridement - Selective/Open Wound N/A Debridement: Pre-procedure Verification/Time Out 09:17 09:17 N/A Taken: Lidocaine 4% Topical Solution Lidocaine 4% Topical Solution N/A Pain Control: [facility]  N/A Tissue Debrided: Non-Viable Tissue Non-Viable Tissue N/A Level: 32.53 23.17 N/A Debridement A (sq cm): rea Curette Curette N/A Instrument: Minimum Minimum N/A Bleeding: Pressure Pressure N/A Hemostasis A chieved: Procedure was tolerated well Procedure was tolerated well N/A Debridement Treatment Response: 7.4x7x0.1 8.2x3.6x0.1 N/A Post Debridement Measurements L x W x D (cm) 4.068 2.318 N/A Post Debridement Volume: (cm) Scarring: Yes Scarring: Yes N/A Periwound Skin Texture: No Abnormalities Noted No Abnormalities Noted N/A Periwound Skin Moisture: Hemosiderin Staining: Yes No Abnormalities Noted N/A Periwound Skin Color: Ecchymosis: No No Abnormality No Abnormality N/A Temperature: Yes Yes N/A Tenderness on Palpation: Debridement Debridement N/A Procedures Performed: Treatment Notes Electronic Signature(s) Signed: 11/20/2022 9:39:47 AM By: Duanne Guess MD FACS Entered By: Duanne Guess on 11/20/2022 09:39:47 -------------------------------------------------------------------------------- Multi-Disciplinary Care Plan Details Patient Name: Date of Service: [name], [name] 11/20/2022 8:30 A M Medical Record Number: [id number] Patient Account Number: [id number] Date of Birth/Sex: Treating RN: 1971-04-09 (52 y.o. [name] Primary Care Mykale Gandolfo: Julianne Handler Other Clinician: Referring Keivon Garden: Treating Jeffey Janssen/Extender: Koleen Nimrod Weeks in Treatment: 52 Multidisciplinary Care Plan reviewed with physician TYREESHA, WAHL ([id number]) [id number] Page 4 of 9 Active Inactive Venous Leg Ulcer Nursing Diagnoses: Actual venous Insuffiency (use after diagnosis is confirmed) Knowledge deficit related to disease process and management Goals: Patient will maintain optimal edema control Date Initiated: 06/26/2021 Target Resolution Date: 12/20/2022 Goal Status: Active Interventions: Assess peripheral  edema status every visit. Compression as ordered Treatment Activities: Therapeutic compression applied : 06/26/2021 Notes: Wound/Skin Impairment Nursing Diagnoses: Impaired tissue integrity Goals: Patient/caregiver will verbalize understanding of skin care regimen Date Initiated: 04/17/2021 Target Resolution Date: 12/20/2022 Goal Status: Active Ulcer/skin breakdown will have a volume reduction of 30% by week 4 Date Initiated: 04/17/2021 Date Inactivated: 05/29/2021 Target Resolution Date: 05/15/2021 Goal Status: Met Ulcer/skin breakdown will have a volume reduction of 50% by week 8 Date Initiated: 05/29/2021 Date Inactivated: 06/26/2021 Target Resolution Date: 06/26/2021 Goal Status: Unmet Unmet Reason: venous reflux Interventions: Assess patient/caregiver ability to obtain necessary supplies Assess patient/caregiver ability to perform ulcer/skin care regimen upon admission and as needed Assess ulceration(s) every visit Provide education on ulcer and skin care Treatment Activities: Topical wound management initiated : 04/17/2021 Notes: 06/08/21: Left leg wounds greater than 30% volume reduction, right leg acute infection. Electronic Signature(s) Signed: 11/20/2022 4:16:02 PM By: Samuella Bruin Entered By: Samuella Bruin on 11/20/2022 08:58:07 -------------------------------------------------------------------------------- Pain Assessment Details Patient Name: Date of Service: [name], [name] 11/20/2022 8:30 A M Medical Record Number: [id number] Patient Account Number: [id number] Date  of Birth/Sex: Treating RN: [birth year] (52 y.o. F) Primary Care John Vasconcelos: Julianne Handler Other Clinician: Referring Cecille Mcclusky: Treating Lizzett Nobile/Extender: Koleen Nimrod Weeks in Treatment: 36 Active Problems Location of Pain Severity and Description of Pain Patient Has Delila Pereyra Site Locations [place], [place] ([id number]) [id number] Page 5 of  9 Site Locations Rate the pain. Current Pain Level: 4 Worst Pain Level: 10 Least Pain Level: 0 Tolerable Pain Level: 2 Pain Management and Medication Current Pain Management: Electronic Signature(s) Signed: 11/20/2022 11:50:33 AM By: Dayton Scrape Entered By: Dayton Scrape on 11/20/2022 08:48:20 -------------------------------------------------------------------------------- Patient/Caregiver Education Details Patient Name: Date of Service: [name], [name] 7/3/2024andnbsp8:30 A M Medical Record Number: [id number] Patient Account Number: [id number] Date of Birth/Gender: Treating RN: January 21, 1971 (52 y.o. [name] Primary Care Physician: Julianne Handler Other Clinician: Referring Physician: Treating Physician/Extender: Koleen Nimrod Weeks in Treatment: 70 Education Assessment Education Provided To: Patient Education Topics Provided Wound/Skin Impairment: Methods: Explain/Verbal Responses: Reinforcements needed, State content correctly Electronic Signature(s) Signed: 11/20/2022 4:16:02 PM By: Samuella Bruin Entered By: Samuella Bruin on 11/20/2022 08:58:20 -------------------------------------------------------------------------------- Wound Assessment Details Patient Name: Date of Service: [name], [name] 11/20/2022 8:30 A M Medical Record Number: [id number] Patient Account Number: [id number] Date of Birth/Sex: Treating RN: 13-May-1971 (52 y.o. [name] Primary Care Katoria Yetman: Julianne Handler Other Clinician: [name] ([id number]) [id number] Page 6 of 9 Referring Maribel Luis: Treating Aveah Castell/Extender: Koleen Nimrod Weeks in Treatment: 83 Wound Status Wound Number: 17 Primary Etiology: Sickle Cell Lesion Wound Location: Right, Lateral Lower Leg Wound Status: Open Wounding Event: Gradually Appeared Comorbid History: Anemia, Sickle Cell Disease, Neuropathy Date Acquired:  10/05/2012 Weeks Of Treatment: 83 Clustered Wound: Yes Photos Wound Measurements Length: (cm) 7.4 Width: (cm) 7 Depth: (cm) 0.1 Area: (cm) 40.684 Volume: (cm) 4.068 % Reduction in Area: 78.3% % Reduction in Volume: 78.4% Epithelialization: Small (1-33%) Tunneling: No Undermining: No Wound Description Classification: Full Thickness Without Exposed Support Structures Wound Margin: Distinct, outline attached Exudate Amount: Medium Exudate Type: Serosanguineous Exudate Color: red, brown Foul Odor After Cleansing: No Slough/Fibrino Yes Wound Bed Granulation Amount: Small (1-33%) Exposed Structure Granulation Quality: Pink Fascia Exposed: No Necrotic Amount: Large (67-100%) Fat Layer (Subcutaneous Tissue) Exposed: Yes Necrotic Quality: Adherent Slough Tendon Exposed: No Muscle Exposed: No Joint Exposed: No Bone Exposed: No Periwound Skin Texture Texture Color No Abnormalities Noted: No No Abnormalities Noted: No Scarring: Yes Ecchymosis: No Hemosiderin Staining: Yes Moisture No Abnormalities Noted: Yes Temperature / Pain Temperature: No Abnormality Tenderness on Palpation: Yes Treatment Notes Wound #17 (Lower Leg) Wound Laterality: Right, Lateral Cleanser Soap and Water Discharge Instruction: May shower and wash wound with dial antibacterial soap and water prior to dressing change. Wound Cleanser Discharge Instruction: Cleanse the wound with wound cleanser prior to applying a clean dressing using gauze sponges, not tissue or cotton balls. Peri-Wound Care Triamcinolone 15 (g) Discharge Instruction: Use triamcinolone 15 (g) as directed Sween Lotion (Moisturizing lotion) Discharge Instruction: Apply moisturizing lotion as directed [name] ([id number]) [id number] Page 7 of 9 Topical Primary Dressing Santyl Ointment Discharge Instruction: Apply nickel thick amount to wound bed as instructed Secondary Dressing ABD Pad, 5x9 Discharge  Instruction: Apply over primary dressing as directed. Woven Gauze Sponge, Non-Sterile 4x4 in Discharge Instruction: Moisten with normal saline and Apply over primary dressing as directed. Secured With Elastic Bandage 4 inch (ACE bandage) Discharge Instruction: Secure with ACE bandage as directed. Kerlix Roll Sterile, 4.5x3.1 (in/yd) Discharge Instruction: Secure with Kerlix as directed. Compression Wrap Compression Stockings  Add-Ons Electronic Signature(s) Signed: 11/20/2022 4:16:02 PM By: Gelene Mink By: Samuella Bruin on 11/20/2022 08:54:28 -------------------------------------------------------------------------------- Wound Assessment Details Patient Name: Date of Service: [name], [name] 11/20/2022 8:30 A M Medical Record Number: [id number] Patient Account Number: [id number] Date of Birth/Sex: Treating RN: 1970/09/24 (52 y.o. [name] Primary Care Deeana Atwater: Julianne Handler Other Clinician: Referring Tristine Langi: Treating Sherlene Rickel/Extender: Koleen Nimrod Weeks in Treatment: 27 Wound Status Wound Number: 21 Primary Etiology: Sickle Cell Lesion Wound Location: Right, Medial Ankle Secondary Etiology: Venous Leg Ulcer Wounding Event: Gradually Appeared Wound Status: Open Date Acquired: 06/26/2021 Comorbid History: Anemia, Sickle Cell Disease, Neuropathy Weeks Of Treatment: 73 Clustered Wound: No Photos Wound Measurements Length: (cm) 8.2 Width: (cm) 3.6 Depth: (cm) 0.1 Area: (cm) 23.185 Volume: (cm) 2.318 Lothrop, Monie ([id number]) % Reduction in Area: 20% % Reduction in Volume: 20% Epithelialization: Small (1-33%) Tunneling: No Undermining: No [id number] Page 8 of 9 Wound Description Classification: Full Thickness Without Exposed Support Structures Wound Margin: Distinct, outline attached Exudate Amount: Medium Exudate Type: Serosanguineous Exudate Color: red, brown Foul Odor After  Cleansing: No Slough/Fibrino Yes Wound Bed Granulation Amount: Small (1-33%) Exposed Structure Granulation Quality: Pink Fascia Exposed: No Necrotic Amount: Large (67-100%) Fat Layer (Subcutaneous Tissue) Exposed: Yes Necrotic Quality: Adherent Slough Tendon Exposed: No Muscle Exposed: No Joint Exposed: No Bone Exposed: No Periwound Skin Texture Texture Color No Abnormalities Noted: No No Abnormalities Noted: Yes Scarring: Yes Temperature / Pain Temperature: No Abnormality Moisture No Abnormalities Noted: Yes Tenderness on Palpation: Yes Treatment Notes Wound #21 (Ankle) Wound Laterality: Right, Medial Cleanser Soap and Water Discharge Instruction: May shower and wash wound with dial antibacterial soap and water prior to dressing change. Wound Cleanser Discharge Instruction: Cleanse the wound with wound cleanser prior to applying a clean dressing using gauze sponges, not tissue or cotton balls. Peri-Wound Care Triamcinolone 15 (g) Discharge Instruction: Use triamcinolone 15 (g) as directed Sween Lotion (Moisturizing lotion) Discharge Instruction: Apply moisturizing lotion as directed Topical Primary Dressing Santyl Ointment Discharge Instruction: Apply nickel thick amount to wound bed as instructed Secondary Dressing ABD Pad, 5x9 Discharge Instruction: Apply over primary dressing as directed. Woven Gauze Sponge, Non-Sterile 4x4 in Discharge Instruction: Moisten with normal saline and Apply over primary dressing as directed. Secured With Elastic Bandage 4 inch (ACE bandage) Discharge Instruction: Secure with ACE bandage as directed. Kerlix Roll Sterile, 4.5x3.1 (in/yd) Discharge Instruction: Secure with Kerlix as directed. Compression Wrap Compression Stockings Add-Ons Electronic Signature(s) Signed: 11/20/2022 4:16:02 PM By: Samuella Bruin Entered By: Samuella Bruin on 11/20/2022 08:55:04 [name] ([id number]) [id number]  Page 9 of 9 -------------------------------------------------------------------------------- Vitals Details Patient Name: Date of Service: Doneen Poisson, [place] [name] 11/20/2022 8:30 A M Medical Record Number: [id number] Patient Account Number: [id number] Date of Birth/Sex: Treating RN: 05-02-1971 (52 y.o. F) Primary Care Carmen Vallecillo: Julianne Handler Other Clinician: Referring Devora Tortorella: Treating Cj Beecher/Extender: Koleen Nimrod Weeks in Treatment: 44 Vital Signs Time Taken: 08:47 Temperature (F): 97.8 Height (in): 67 Pulse (bpm): 68 Weight (lbs): 134 Respiratory Rate (breaths/min): 20 Body Mass Index (BMI): 21 Blood Pressure (mmHg): 113/70 Reference Range: 80 - 120 mg / dl Electronic Signature(s) Signed: 11/20/2022 11:50:33 AM By: Dayton Scrape Entered By: Dayton Scrape on 11/20/2022 08:48:02

## 2022-11-20 NOTE — Progress Notes (Signed)
ILEAN, GESSERT ([id number]) [id number] Page 1 of 17 Visit Report for 11/20/2022 Chief Complaint Document Details Patient Name: Date of Service: [name] [name] 11/20/2022 8:30 A M Medical Record Number: [id number] Patient Account Number: [id number] Date of Birth/Sex: Treating RN: [birth year] (52 y.o. F) Primary Care Provider: Julianne Handler Other Clinician: Referring Provider: Treating Provider/Extender: Koleen Nimrod Weeks in Treatment: 37 Information Obtained from: Patient Chief Complaint the patient is here for evaluation of her bilateral lower extremity sickle cell ulcers 04/17/2021; patient comes in for substantial wounds on the right and left lower leg Electronic Signature(s) Signed: 11/20/2022 9:39:57 AM By: Duanne Guess MD FACS Entered By: Duanne Guess on 11/20/2022 09:39:57 -------------------------------------------------------------------------------- Debridement Details Patient Name: Date of Service: [name] [name], [name] 11/20/2022 8:30 A M Medical Record Number: [id number] Patient Account Number: [id number] Date of Birth/Sex: Treating RN: [birth year] (52 y.o. [name] Primary Care Provider: Julianne Handler Other Clinician: Referring Provider: Treating Provider/Extender: Koleen Nimrod Weeks in Treatment: 83 Debridement Performed for Assessment: Wound #17 Right,Lateral Lower Leg Performed By: Physician Duanne Guess, MD Debridement Type: Debridement Level of Consciousness (Pre-procedure): Awake and Alert Pre-procedure Verification/Time Out Yes - 09:17 Taken: Start Time: 09:17 Pain Control: Lidocaine 4% T opical Solution Percent of Wound Bed Debrided: 80% T Area Debrided (cm): otal 32.53 Tissue and other material debrided: Non-Viable, Slough, Slough Level: Non-Viable Tissue Debridement Description: Selective/Open Wound Instrument: Curette Bleeding: Minimum Hemostasis Achieved:  Pressure Response to Treatment: Procedure was tolerated well Level of Consciousness (Post- Awake and Alert procedure): Post Debridement Measurements of Total Wound Length: (cm) 7.4 Width: (cm) 7 Depth: (cm) 0.1 Volume: (cm) 4.068 Character of Wound/Ulcer Post Debridement: Improved Post Procedure Diagnosis [name] ([id number]) [id number] Page 2 of 17 Same as Pre-procedure Notes scribed for Dr. Lady Gary by Samuella Bruin, RN Electronic Signature(s) Signed: 11/20/2022 4:16:02 PM By: Samuella Bruin Signed: 11/20/2022 4:41:42 PM By: Duanne Guess MD FACS Entered By: Samuella Bruin on 11/20/2022 09:20:26 -------------------------------------------------------------------------------- Debridement Details Patient Name: Date of Service: [name], [name] 11/20/2022 8:30 A M Medical Record Number: [id number] Patient Account Number: [id number] Date of Birth/Sex: Treating RN: [birth year] (52 y.o. [name] [name] Primary Care Provider: Julianne Handler Other Clinician: Referring Provider: Treating Provider/Extender: Koleen Nimrod Weeks in Treatment: 83 Debridement Performed for Assessment: Wound #21 Right,Medial Ankle Performed By: Physician Duanne Guess, MD Debridement Type: Debridement Severity of Tissue Pre Debridement: Fat layer exposed Level of Consciousness (Pre-procedure): Awake and Alert Pre-procedure Verification/Time Out Yes - 09:17 Taken: Start Time: 09:17 Pain Control: Lidocaine 4% T opical Solution Percent of Wound Bed Debrided: 100% T Area Debrided (cm): otal 23.17 Tissue and other material debrided: Non-Viable, Slough, Slough Level: Non-Viable Tissue Debridement Description: Selective/Open Wound Instrument: Curette Bleeding: Minimum Hemostasis Achieved: Pressure Response to Treatment: Procedure was tolerated well Level of Consciousness (Post- Awake and Alert procedure): Post Debridement  Measurements of Total Wound Length: (cm) 8.2 Width: (cm) 3.6 Depth: (cm) 0.1 Volume: (cm) 2.318 Character of Wound/Ulcer Post Debridement: Improved Severity of Tissue Post Debridement: Fat layer exposed Post Procedure Diagnosis Same as Pre-procedure Notes scribed for Dr. Lady Gary by Samuella Bruin, RN Electronic Signature(s) Signed: 11/20/2022 4:16:02 PM By: Samuella Bruin Signed: 11/20/2022 4:41:42 PM By: Duanne Guess MD FACS Entered By: Samuella Bruin on 11/20/2022 09:20:50 [name] ([id number]) [id number] Page 3 of 17 -------------------------------------------------------------------------------- HPI Details Patient Name: Date of Service: [name] [name] 11/20/2022 8:30 A M Medical Record Number: [id number] Patient Account Number: [id number] Date of Birth/Sex: Treating  RN: January 02, 1971 (52 y.o. F) Primary Care Provider: Julianne Handler Other Clinician: Referring Provider: Treating Provider/Extender: Koleen Nimrod Weeks in Treatment: 2 History of Present Illness Location: medial and lateral ankle region on the right and left medial malleolus Quality: Patient reports experiencing a shooting pain to affected area(s). Severity: Patient states wound(s) are getting worse. Duration: right lower extremity bimalleolar ulcers have been present for approximately 2 years; the right medial malleolus ulcer has been there proximally 6 months Timing: Pain in wound is constant (hurts all the time) Context: The wound would happen gradually ssociated Signs and Symptoms: Patient reports having increase discharge. A HPI Description: 52 year old patient with a history of sickle cell anemia who was last seen by me with ulceration of the right lower extremity above the ankle and was referred to Dr. Marzetta Board for a surgical debridement as I was unable to do anything in the office due to excruciating pain. At that stage she was referred  from the plastic surgery service to dermatology who treated her for a skin infection with doxycycline and then Levaquin and a local antibiotic ointment. I understand the patient has since developed ulceration on the left ankle both medial and lateral and was now referred back to the wound center as dermatology has finished the management. I do not have any notes from the dermatology department Old notes: 52 year old patient with a history of sickle cell anemia, pain bilateral lower extremities, right lower extremity ulcer and has a history of receiving a skin graft( Theraskin) several months ago. She has been visiting the wound center [facility] and was seen by Dr. Leanord Hawking and Dr. Marzetta Board. after prolonged conservator therapy between July 2016 and January 2017. She had been seen by the plastic surgeon and taken to the OR for debridement and application of Theraskin. She had 3 applications of Theraskin and was then treated with collagen. Prior to that she had a history of similar problems in 2014 and was treated conservatively. Had a reflux study done for the right lower extremity in August 2016 without reflux or DVT . Past medical history significant for sickle cell disease, anemia, leg ulcers, cholelithiasis,and has never been a smoker. Once the patient was discharged on the wound center she says within 2 or 3 weeks the problems recurred and she has been treating it conservatively. since I saw her 3 weeks ago at [facility] she has been unable to get her dressing material but has completed a course of doxycycline. 6/7/ 2017 -- lower extremity venous duplex reflux evaluation was done No evidence of SVT or DVT in the RLL. No venous incompetence in the RLL. No further vascular workup is indicated at this time. She was seen by Dr. Mina Marble, on 10/04/2015. She agreed with the plan of taking her to the OR for debridement and application of theraskin and would also take biopsies to rule out pyoderma  gangrenosum. Follow-up note dated May 31 received and she was status post application of Theraskin to multiple ulcers around the right ankle. Pathology did not show evidence of malignancy or pyoderma gangrenosum. She would continue to see as in the wound clinic for further care and see Dr. Marzetta Board as needed. The patient brought the biopsy report and it was consistent with stasis ulcer no evidence of malignancy and the comment was that there was some adjacent neovascularization, fibrosis and patchy perivascular chronic inflammation. 11/15/2015 -- today we applied her first application of Theraskin 11/30/15; TheraSkin #2 12/13/2015 -- she is having a lot of  pain locally and is here for possible application of a theraskin today. 01/16/2016 -- the patient has significant pain and has noticed despite in spite of all local care and oral pain medication. It is impossible to debride her in the office. 02/06/2016 -- I do not see any notes from Dr. Leta Baptist( the patient has not made a call to the office know as she heard from them) and the only visit to recently was with her PCP Dr. Gypsy Decant -- I saw her on 01/16/2016 and prescribed 90 tablets of oxycodone 10 mg and did lab work and screening for HIV. the HIV was negative and hemoglobin was 6.3 with a WBC count of 14.9 and hematocrit of 17.8 with platelets of 561. 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 The bimalleolar ulcers to the right lower extremity have been present for approximately 2 years, the left medial malleolus ulcer has been present approximately 6 months. She has followed with Dr.Thimmappa in the past and has had a total of 3 applications of Theraskin (01/2015, 09/2015, 06/17/16). She has also followed with Dr. Meyer Russel here in the clinic and has received 2 applications of TheraSkin (11/10/15, 11/30/15). The patient does  experience chronic, and is not amenable to debridement. 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 She is not currently on any antibiotic therapy and has not been treated with any recently. 07/17/2016 -- was seen by Dr. Leta Baptist of plastic surgery who saw her 2 weeks postop application of Theraskin #3. She had removed her dressing and asked her to apply silver alginate on alternate days and follow-up back with the wound center. Future debridements and application of skin substitute would have to be done in the hospital due to her high risk for anesthesia. READMISSION 04/17/2021 Patient is now a 52 year old woman that we have had in this clinic for a prolonged period of time and 2016-2017 and then again for 2 visits in February 2018. At that point she had wounds on the right lower leg predominantly medial. She had also been seen by plastic surgery Dr. Marzetta Board who I believe took her to the OR for operative debridement and application of TheraSkin in 2017. After she left our clinic she was followed for a very prolonged period of time in the wound care center in [facility] who then referred her ultimately to [facility] where she was seen by Dr. Mardene Speak. Again taken her to the OR for skin grafting which apparently did not take. She had multiple other attempts at dressings although I have not really looked over all of these notes in great detail. She has not been seen in a wound care center in about a year. She states over the last year in addition to her right lower leg she has developed wounds on the left lower leg quite extensive. She is using Xeroform to all of these wounds without really any improvement. She also has Medicaid which does not cover wound products. The patient has had vascular work-ups in the past including most recently on 03/28/2021 showing biphasic waveforms on the right triphasic at the PTA and biphasic at the dorsalis pedis on the left. She was  unable to tolerate any degree of compression to do ABIs. Unfortunately TBI's were also not done. She had venous reflux studies done in 2017. This did not show any evidence of a DVT or SVT  and no venous incompetence was noted in the right leg at the time this was the only side with the wound As noted I did not look all over her old records. She apparently had a course of HBO and Baptist although I am not sure what the indication would have been. In any case she developed seizures and terminated treatment earlier. She is generally much more disabled than when we last saw her in clinic. She can no longer walk pretty much wheelchair-bound because predominantly of pain in the left hip. 04/24/2021; the patient tolerated the wraps we put on. We used Santyl and Hydrofera Blue under compression. I brought her back for a nurse visit for a change in dressing. With Medicaid we will have a hard time getting anything paid for and hence the need for compression. She arrives in clinic with all the wounds looking somewhat better in terms of surface [name], [name] ([id number]) [id number] Page 4 of 17 12/20; circumferential wound on the right from the lateral to the medial. She has open areas on the left medial and left lateral x2 on all of this with the same surface. This does not look completely healthy although she does have some epithelialization. She is not complaining of a lot of pain which is unusual for her sickle ulcers. I have not looked over her extensive records from [facility]. She had recent arterial studies and has a history of venous reflux studies I will need to look these over although I do not believe she has significant arterial disease 2023 05/22/2021; patient's wound areas measure slightly smaller. Still a lot of drainage coming from the right we have been using Hydrofera Blue and Santyl with some improvement in the wound surfaces. She tells me she will be getting  transfused later in the week for her underlying sickle cell anemia I have looked over her recent arterial studies which were done in the fall. This was in November and showed biphasic and triphasic waveforms but she could not tolerate ABIs because of pressure and unfortunately TBI's were not done. She has not had recent venous reflux studies that I can see 1/10; not much change about the same surface area. This has a yellowish surface to it very gritty. We have been using Santyl and Hydrofera Blue for a prolonged period. Culture I did last week showed methicillin sensitive staph aureus "rare". Our intake nurse reports greenish drainage which may be the Hydrofera Blue itself 1/17; wounds are continue to measure smaller although I am not sure about the accuracy here. Especially the areas on the right are covered in what looks to be a nonviable surface although she does have some epithelialization. Similarly she has areas on the left medial and left lateral ankle area which appear to have a better surface and perhaps are slightly smaller. We have been using Santyl and Hydrofera Blue. She cannot tolerate mechanical debridements She went for her reflux studies which showed significant reflux at the greater saphenous vein at the saphenofemoral junction as well as the greater saphenous vein in the proximal calf on the left she had reflux in the thigh and the common femoral vein and supra vein [name] vein reflux in the greater saphenous vein. I will have vein and vascular look at this. My thoughts have been that these are likely sickle wounds. I looked through her old records from [facility] wound care center and then when she graduated to [facility] wound care center where she saw Dr. Ronda Fairly and Dr.  Molnar. Although I can see she had reflux studies done I do not see that she actually saw a vein and vascular. I went over the fact that she had operative debridements and actual skin grafting that did not  take. I do not think these wounds have ever really progressed towards healing 1/31;Substantial wounds on the right ankle area. Hyper granulated very gritty adherent debris on the surface. 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 On the left she has no DVT or SVT deep vein reflux in the common femoral vein. Superficial vein reflux in the greater saphenous vein on although the vein diameter is not really all that large. I do not think there is anything that can be done with these although I am going to send her for consultation to vein and vascular. 2/7; Wound exam; substantial wound area on the right posterior ankle area and areas on the left medial ankle and left lateral ankle. I was able to debride the left medial ankle last week fairly aggressively and it is back this week to a completely nonviable surface She will see vascular surgery this Friday and I would like them to review the venous studies and also any comments on her arterial status. If they do not see an issue here I am going to refer her to plastic surgery for an operative debridement perhaps intraoperative ACell or Integra. Eventually she will require a deep tissue culture again 2/14; substantial wound area on the right posterior ankle, medial ankle. We have been using silver alginate The patient was seen by vein and vascular she had both venous reflux studies and arterial studies. 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 She had no evidence of superficial venous thrombosis. She did have superficial vein  reflux at the saphenofemoral junction and the greater saphenous vein. On the left no evidence of a DVT no evidence of superficial venous throat thrombosis she did have deep vein reflux in the common femoral vein and superficial vein reflux in the greater saphenous vein but these were not felt to be amenable to ablation. In terms of arterial studies she had triphasic and wife biphasic wave waveforms bilaterally not felt to have a significant arterial issue. I do not get the feeling that they felt that any part of her nonhealing wounds were related to either arterial or venous issues. They did note that she had venous reflux at the right at the [facility] and [facility]. And also on the left there were reflux in the deep system at the common femoral vein and greater saphenous vein in the proximal thigh. Nothing amenable to ablation. 2/20; she is making some decent progress on the right where there is nice skin between the 2 open areas on the right ankle. The surfaces here do not look viable yet there is some surrounding epithelialization. She still has a small area on the left medial ankle area. Hyper-granulated Jody's away always 2/28 patient has an appointment with plastic surgery on 3/8. We will see her back on 3/9. She may have to  call us to get the area redressed. We've been using Santyl under silver alginate. We made a nice improvement on the left medial ankle. The larger wounds on the right also looks somewhat better in terms of epithelialization although I think they could benefit from an aggressive debridement if plastic surgery would be willing to do that. Perhaps placement of Integra or a cell 07/26/2021: She saw Dr. Arita Miss yesterday. He raised the question as to whether or not this might be pyoderma and wanted to wait until that question was answered by dermatology before proceeding with any sort of operative debridement. We have continue to use Santyl under silver alginate with Kerlix and Coban  wraps. Overall, her wounds appear to be continuing to contract and epithelialize, with some granulation tissue present. There continues to be some slough on all wound surfaces. 08/09/2021: She has not been able to get an appointment with dermatology because apparently the offices in [facility] and [place] do not accept Medicaid. She is looking into whether or not she can be seen at the main [facility] dermatology clinic. This is necessary because plastic surgery is concerned that her wounds might represent pyoderma and they did not want to do any procedure until that was clarified. We have been using Santyl under silver alginate with Kerlix and Coban wraps. Today, there was a greater amount of drainage on her dressings with a slight green discoloration and significant odor. Despite this, her wounds continue to contract and epithelialize. There is pale granulation tissue present and actually, on the left medial ankle, the granulation tissue is a bit hypertrophic. 08/16/2021: Last week, I took a culture and this grew back rare methicillin-resistant Staph aureus and rare corynebacterium. The MRSA was sensitive to gentamicin which we began applying topically on an empiric basis. This week, her wounds are a bit smaller and the drainage and odor are less. Her primary care provider is working on assisting the patient with a dermatology evaluation. She has been in silver alginate over the gentamicin that was started last week along with Kerlix and Coban wraps. 08/23/2021: Because she has Medicaid, we have been unable to get her into see any dermatologist in the [place] to rule out pyoderma gangrenosum, which was a requirement from plastic surgery prior to any sort of debridement and grafting. Despite this, however, all of her wounds continue to get smaller. The wound on her left medial ankle is nearly closed. There is no odor from the wounds, although she still accumulates a modest amount of drainage on  her dressings. 08/30/2021: The lateral right ankle wound and the medial left ankle wound are a bit smaller today. The medial right ankle wound is about the same size. They are less tender. We have still been unable to get her into dermatology. 09/06/2021: All of the wounds are about the same size today. She continues to endorse minimal pain. I communicated with Dr. Arita Miss in plastic surgery regarding our issues getting a dermatology appointment; he was out of town but indicated that he would look into perhaps performing the biopsy in his office and will have his office contact her. TYKERA, CANELO ([id number]) [id number] Page 5 of 17 09/14/2021: The patient has an appointment in dermatology, but it is not until October. Her wounds are roughly the same; she continues to have very thick purulent-looking drainage on her dressings. 09/20/2021: The left medial wound is nearly closed and just has a bit of accumulated eschar on the surface. The right medial and lateral ankle  wounds are perhaps a little bit smaller. They continue to have a very pale surface with accumulation of thin slough. 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 I did not think Jodie Echevaria was indicated based on this. She is getting topical mupirocin with Prisma silver collagen. 10/04/2021: The patient was not seen in clinic last week due to childcare coverage issues. In the interim, the left medial leg wound has closed. The right sided leg wounds are smaller. There is more granulation tissue coming through, particularly on the lateral wound. The surface remains somewhat gritty. We have been applying topical mupirocin and Prisma silver collagen. 10/11/2021: The left medial leg wound remains closed. She does complain of some anesthetic sensation to the area. Both of the right-sided leg wounds are smaller but still have accumulated slough. 10/18/2021: Both right-sided leg wounds are minimally  smaller this week. She still continues to accumulate slough and has thick drainage on her dressings. 10/23/2021: Both wounds continue to contract. There is still slough buildup. She has been approved for a keratin-based skin substitute trial product but it will not be available until next week. 10/30/2021: The wounds are about the same to perhaps slightly smaller. There is still continued slough buildup. Unfortunately, the rep for the keratin based product did not show up today and did not answer his phone when called. 11/08/2021: The wounds are little bit smaller today. She continues to have thick drainage but the surfaces are relatively clean with just a little bit of slough accumulation. She reported to me today that she is unable to completely flex her left ankle and on examination it seems this is potentially related to scar tissue from her wounds. We do have the ProgenaMatrix trial product available for her today. 11/15/2021: Both wounds are smaller today. There is some slough accumulation on the surfaces, but the medial wound, in particular looks like it is filling in and is less deep. She did hear from physical therapy and she is going to start working with them on July 11. She is here for her second application of the trial skin substitute, ProgenaMatrix. 11/22/2021: Both wounds continue to contract, the medial more dramatically than the lateral. Both wounds have a layer of slough on the surface, but underneath this, the gritty fibrous tissue has a little bit more of a pink cast to it rather than being as pale as it has been. 11/29/2021: The wounds are roughly the same size this week, perhaps a millimeter or 2 smaller. The medial wound has filled in and is nearly flush with the surrounding skin surface. She continues to have a lot of slough accumulation on both surfaces. 12/06/2021: No significant change to her wounds, but she has a new opening on her dorsal foot, just distal to the right lateral  ankle wound. The area on her left medial ankle that reopened looks a little bit larger today. She has quite a bit of pain associated with the new wound. 12/12/2021: Her wounds look about the same but the new opening on her right lateral dorsal foot is a little bit bigger. She continues to have a fair amount of pain with this wound. 12/26/2021: The left medial ankle wound is tiny and superficial. She has 2 areas of crusting on her left lateral ankle, however, that appear to be threatening to open again. Her right medial ankle wound is a little bit smaller today but still continues to accumulate thick rubbery slough. The new dorsal foot wound is exquisitely  painful but there is no odor or purulent drainage. No erythema or induration. The right lateral ankle wound looks about the same today, again with thick rubbery slough. 01/03/2022: The left medial ankle wound has closed again. Both right ankle wounds appear to be about the same size with thick rubbery slough. The dorsal foot wound on the right continues to be quite painful and she stated that she did not want any debridement of that site today. 01/10/2022: No real change to any of her wounds. She continues to accumulate thick slough. The dorsal foot wound has merged with the lateral malleolar wound. She is experiencing significant pain in the dorsal foot portion of the ulcer. 01/16/2022: Absolutely no change or progress in her wounds. 01/24/2022: Her wounds are unchanged. She continues to build up slough and the wounds on her dorsal right foot are still exquisitely tender. 01/31/2022: The wounds actually measure a little bit narrower today. They still have thick slough on the surface but the underlying tissue seems a little less fibrotic. We changed to Iodosorb last week. 02/07/2022: Wounds continue to slowly epithelialize around the parameters. She has less pain today. She still accumulates a fairly substantial layer of slough. 02/15/2022: No change to the  wounds overall. The measured a little bit larger today per the intake nurse. She continues to have substantial slough accumulation and her pain is a little bit worse. 02/21/2022. No change at all to her wounds. I do not see that plastic surgery has received her referral yet. 02/28/2022: The wounds remain unchanged. They are dry and fibrotic with accumulation of slough and eschar. She did receive an appointment to see plastic surgery on October 23, but the office called her back and indicated they needed to reschedule and that the next available appointment was not until December. The patient became angry and decided she did not want to see this plastics group. 10/19; the patient sees Dr. Maylene Roes again next week. She is using Medihoney as the primary dressing changing this herself. 03/21/2022: She saw Dr. Ferd Hibbs at the wound care center at [facility]. Dr. Ferd Hibbs was also unable to debride her, secondary to pain and is planning to take her to the operating room for operative debridement and potential skin substitute placement. Her wounds are unchanged with thick yellow slough and a fibrotic base. She says they are too painful to be debrided. In addition, yesterday her hemoglobin was 4 and she received 2 units packed red blood cell transfusion. 04/04/2022: Her operation at [facility] is scheduled for December 15. She continues to use Medihoney on her wounds. They are a little bit less painful today and she is willing to entertain the possibility of debridement. The wounds measured a little bit larger in all dimensions today but the layer of slough is not as thick as usual. 04/18/2022: Her wounds actually measured a little bit smaller today and the extension onto the dorsal foot from the lateral wound has healed. They are less tender and there is significantly less slough present as compared to prior visits. 05/09/2022: She had to reschedule her surgery due to lack of childcare.  It is now planned for January 5. The wounds are basically unchanged, but she had a lot more drainage, which was thick and somewhat purulent in appearance. No significant odor. 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 They are quite painful today and she does not want to have debridement performed. 05/30/2022: The patient underwent surgical debridement and placement of TheraSkin  to her wounds on January 5. She has been doing well and does not endorse any pain. The TheraSkin is intact and looks beautiful. It is sutured in place. There is no exudate or significant drainage. [name], [name] ([id number]) [id number] Page 6 of 17 06/04/2022: Her TheraSkin remains intact and I am starting to see granulation tissue emerging underneath the surface. No concern for infection. 06/12/2022: The TheraSkin is intact and there is more granulation tissue emerging. It is starting to look a little bit dry, however. No malodor or purulent drainage. 06/19/2022: The TheraSkin continues to look good. The edges are a bit dry, but the central portion of each of her wounds is showing a nice pink color. 06/27/2022: The TheraSkin on the lateral wound remains almost completely intact. There is nice pink tissue peaking through the mesh of the TheraSkin. On the medial leg, it is starting to breakdown a little bit, but most of the TheraSkin remains intact here, as well. Both wounds measured smaller today. 07/03/2022: The wounds are fairly dry around the perimeter today. Some of the suture is hanging loose. 07/12/2022: Both wounds are measuring smaller today. The medial ulcer is getting a bit too dry. The lateral ulcer continues to have nice buds of granulation tissue emerging. 07/26/2022: The moisture balance on both wounds is much better today. For the first time since I began seeing her, I see actual beefy red granulation tissue on the lateral ankle wound. There are more buds of deep pink granulation  tissue emerging on the medial ankle. There is some dry eschar around the edges of the medial wound and a tiny amount of slough overlying the good granulation tissue on the lateral wound. 08/09/2022: Unfortunately, she has a new wound on her left lateral lower leg, just above the ankle. It is extremely painful for her. It penetrates to the fat layer and has the same woody, fibrotic character as her previous wounds. The other wounds are both a little bit smaller with slough and some eschar around the periphery. 08/14/2022: Her right lateral ankle wound is a little bit smaller. Unfortunately, she has had an increase in her drainage and it has a purulent nature to it, similar to what was present prior to her surgical debridement. She also reports odor coming from her wounds this week. The left lateral leg wound remains extremely tender. 08/21/2022: The left lateral ankle wound is larger and more painful today. Both right ankle wounds have a little bit more vital appearance, but they have accumulated more slough. She reports less odor this week. She is currently taking Augmentin. 08/28/2022: The left lateral ankle wound continues to enlarge and remains quite painful. Both of the right ankle wounds actually look better this week. There is improved tissue quality with less slough accumulation. 09/27/2022: The left lateral ankle wound actually looks nearly healed. She says that she has been applying some silver alginate that she had leftover. She was unable to afford the Santyl that was prescribed, but found a tube that she had on hand at home and has been using this on her right ankle wounds. These wounds look about the same and have accumulated the usual layer of slough. Unfortunately, a portion of the dorsal foot wound has reopened and is exquisitely painful today. 10/09/2022: The left lateral ankle wound is healed. The dorsal foot wound appears to have expanded and remains exquisitely painful. The medial and  lateral right ankle wounds look about the same size, but they are quite a bit cleaner. 10/23/2022:  The left lateral ankle remains closed. The dorsal foot wound has expanded to the point that it now converges with the right lateral leg wound. She has accumulated fairly thick slough on all of the open surfaces. 11/06/2022: No significant change to any of her wounds, although the dorsal foot aspect of the right lateral wound is a little bit smaller. She has been approved for the [employer] and [employer] for [employer] and will be able to get a full year's supply. 11/20/2022: The dorsal foot aspect of the right lateral wound has almost completely closed. I am seeing nice pink tissue starting to emerge from the fibrotic surface of both wounds. The medial leg wound appears to be a little bit smaller today. She has been using Santyl. Electronic Signature(s) Signed: 11/20/2022 9:40:56 AM By: Duanne Guess MD FACS Entered By: Duanne Guess on 11/20/2022 09:40:56 -------------------------------------------------------------------------------- Physical Exam Details Patient Name: Date of Service: [name], [name] 11/20/2022 8:30 A M Medical Record Number: [id number] Patient Account Number: [id number] Date of Birth/Sex: Treating RN: 22-Jun-1970 (52 y.o. F) Primary Care Provider: Julianne Handler Other Clinician: Referring Provider: Treating Provider/Extender: Koleen Nimrod Weeks in Treatment: 35 Constitutional . . . . no acute distress. Respiratory Normal work of breathing on room air. Notes 11/20/2022: The dorsal foot aspect of the right lateral wound has almost completely closed. I am seeing nice pink tissue starting to emerge from the fibrotic surface of both wounds. The medial leg wound appears to be a little bit smaller today. Electronic Signature(s) Signed: 11/20/2022 9:54:24 AM By: Duanne Guess MD FACS [place], [name] ([id number]) By: Duanne Guess MD FACS  [phone].pdf Page 7 of 17 Signed: 11/20/2022 9:54:24 AM Previous Signature: 11/20/2022 9:41:46 AM Version By: Duanne Guess MD FACS Entered By: Duanne Guess on 11/20/2022 09:54:24 -------------------------------------------------------------------------------- Physician Orders Details Patient Name: Date of Service: [name], [name] 11/20/2022 8:30 A M Medical Record Number: [id number] Patient Account Number: [id number] Date of Birth/Sex: Treating RN: 03/12/1971 (52 y.o. 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 - Dr. Lady Gary Rm 2 Return A Anesthetic (In clinic) Topical Lidocaine 4% applied to wound bed Bathing/ Shower/ Hygiene May shower and wash wound with soap and water. Edema Control - Lymphedema / SCD / Other Elevate legs to the level of the heart or above for 30 minutes daily and/or when sitting for 3-4 times a day throughout the day. Avoid standing for long periods of time. Exercise regularly Additional Orders / Instructions Follow Nutritious Diet Juven Shake 1-2 times daily. Wound Treatment Wound #17 - Lower Leg Wound Laterality: Right, Lateral Cleanser: Soap and Water 1 x Per Week/30 Days Discharge Instructions: May shower and wash wound with dial antibacterial soap and water prior to dressing change. Cleanser: Wound Cleanser 1 x Per Week/30 Days Discharge Instructions: Cleanse the wound with wound cleanser prior to applying a clean dressing using gauze sponges, not tissue or cotton balls. Peri-Wound Care: Triamcinolone 15 (g) 1 x Per Week/30  Days Discharge Instructions: Use triamcinolone 15 (g) as directed Peri-Wound Care: Sween Lotion (Moisturizing lotion) 1 x Per Week/30 Days Discharge Instructions: Apply moisturizing lotion as directed Prim Dressing: Santyl Ointment 1 x Per Week/30 Days ary Discharge  Instructions: Apply nickel thick amount to wound bed as instructed Secondary Dressing: ABD Pad, 5x9 1 x Per Week/30 Days Discharge Instructions: Apply over primary dressing as directed. Secondary Dressing: Woven Gauze Sponge, Non-Sterile 4x4 in 1 x Per Week/30 Days Discharge Instructions: Moisten with normal saline and Apply over primary dressing as directed. Secured With: Elastic Bandage 4 inch (ACE bandage) 1 x Per Week/30 Days Discharge Instructions: Secure with ACE bandage as directed. Secured With: [employer], 4.5x3.1 (in/yd) 1 x Per Week/30 Days Discharge Instructions: Secure with Kerlix as directed. Wound #21 - Ankle Wound Laterality: Right, Medial [name], [name] ([id number]) [id number] Page 8 of 17 Cleanser: Soap and Water 1 x Per Week/30 Days Discharge Instructions: May shower and wash wound with dial antibacterial soap and water prior to dressing change. Cleanser: Wound Cleanser 1 x Per Week/30 Days Discharge Instructions: Cleanse the wound with wound cleanser prior to applying a clean dressing using gauze sponges, not tissue or cotton balls. Peri-Wound Care: Triamcinolone 15 (g) 1 x Per Week/30 Days Discharge Instructions: Use triamcinolone 15 (g) as directed Peri-Wound Care: Sween Lotion (Moisturizing lotion) 1 x Per Week/30 Days Discharge Instructions: Apply moisturizing lotion as directed Prim Dressing: Santyl Ointment 1 x Per Week/30 Days ary Discharge Instructions: Apply nickel thick amount to wound bed as instructed Secondary Dressing: ABD Pad, 5x9 1 x Per Week/30 Days Discharge Instructions: Apply over primary dressing as directed. Secondary Dressing: Woven Gauze  Sponge, Non-Sterile 4x4 in 1 x Per Week/30 Days Discharge Instructions: Moisten with normal saline and Apply over primary dressing as directed. Secured With: Elastic Bandage 4 inch (ACE bandage) 1 x Per Week/30 Days Discharge Instructions: Secure with ACE bandage as directed. Secured With: [employer], 4.5x3.1 (in/yd) 1 x Per Week/30 Days Discharge Instructions: Secure with Kerlix as directed. Patient Medications llergies: No Known Allergies A Notifications Medication Indication Start End 11/20/2022 lidocaine DOSE topical 4 % cream - cream topical Electronic Signature(s) Signed: 11/20/2022 4:41:42 PM By: Duanne Guess MD FACS Entered By: Duanne Guess on 11/20/2022 09:54:40 -------------------------------------------------------------------------------- Problem List Details Patient Name: Date of Service: [name], [name] 11/20/2022 8:30 A M Medical Record Number: [id number] Patient Account Number: [id number] Date of Birth/Sex: Treating RN: 15-Feb-1971 (52 y.o. F) Primary Care Provider: Julianne Handler Other Clinician: Referring Provider: Treating Provider/Extender: Koleen Nimrod Weeks in Treatment: 63 Active Problems ICD-10 Encounter Code Description Active Date MDM Diagnosis L97.818 Non-pressure chronic ulcer of other part of right lower leg with other specified 04/17/2021 No Yes severity 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 04/17/2021 No Yes Inactive Problems TYAUNA, EDMUNDS ([id number]) [id number] Page 9 of 17 ICD-10 Code Description Active Date Inactive Date L97.828 Non-pressure chronic ulcer of other part of left lower leg with other specified severity 04/17/2021 04/17/2021 Resolved Problems ICD-10 Code Description Active Date Resolved Date L97.822 Non-pressure chronic ulcer of other part of left lower leg with fat layer exposed 08/09/2022 08/09/2022 Electronic Signature(s) Signed: 11/20/2022 9:39:40 AM By:  Duanne Guess MD FACS Entered By: Duanne Guess on 11/20/2022 09:39:40 -------------------------------------------------------------------------------- Progress Note Details Patient Name: Date of Service: [name], [name] 11/20/2022 8:30 A M Medical Record Number: [id number] Patient Account Number: [id number] Date of Birth/Sex: Treating RN: April 03, 1971 (52 y.o. F) Primary Care Provider: Julianne Handler Other Clinician: Referring Provider: Treating Provider/Extender: Koleen Nimrod Weeks in Treatment: 61 Subjective Chief Complaint Information obtained from Patient the patient is here for evaluation of her bilateral lower extremity sickle cell ulcers 04/17/2021; patient comes in for substantial wounds on  the right and left lower leg History of Present Illness (HPI) The following HPI elements were documented for the patient's wound: Location: medial and lateral ankle region on the right and left medial malleolus Quality: Patient reports experiencing a shooting pain to affected area(s). Severity: Patient states wound(s) are getting worse. Duration: right lower extremity bimalleolar ulcers have been present for approximately 2 years; the right medial malleolus ulcer has been there proximally 6 months Timing: Pain in wound is constant (hurts all the time) Context: The wound would happen gradually Associated Signs and Symptoms: Patient reports having increase discharge. 52 year old patient with a history of sickle cell anemia who was last seen by me with ulceration of the right lower extremity above the ankle and was referred to Dr. Marzetta Board for a surgical debridement as I was unable to do anything in the office due to excruciating pain. At that stage she was referred from the plastic surgery service to dermatology who treated her for a skin infection with doxycycline and then Levaquin and a local antibiotic ointment. I understand the patient has since developed  ulceration on the left ankle both medial and lateral and was now referred back to the wound center as dermatology has finished the management. I do not have any notes from the dermatology department Old notes: 52 year old patient with a history of sickle cell anemia, pain bilateral lower extremities, right lower extremity ulcer and has a history of receiving a skin graft( Theraskin) several months ago. She has been visiting the wound center [facility] and was seen by Dr. Leanord Hawking and Dr. Marzetta Board. after prolonged conservator therapy between July 2016 and January 2017. She had been seen by the plastic surgeon and taken to the OR for debridement and application of Theraskin. She had 3 applications of Theraskin and was then treated with collagen. Prior to that she had a history of similar problems in 2014 and was treated conservatively. Had a reflux study done for the right lower extremity in August 2016 without reflux or DVT . Past medical history significant for sickle cell disease, anemia, leg ulcers, cholelithiasis,and has never been a smoker. Once the patient was discharged on the wound center she says within 2 or 3 weeks the problems recurred and she has been treating it conservatively. since I saw her 3 weeks ago at [facility] she has been unable to get her dressing material but has completed a course of doxycycline. 6/7/ 2017 -- lower extremity venous duplex reflux evaluation was done  No evidence of SVT or DVT in the RLL. No venous incompetence in the RLL. No further vascular workup is indicated at this time. She was seen by Dr. Mina Marble, on 10/04/2015. She agreed with the plan of taking her to the OR for debridement and application of theraskin and would also take biopsies to rule out pyoderma gangrenosum. Follow-up note dated May 31 received and she was status post application of Theraskin to multiple ulcers around the right ankle. Pathology did not show evidence of malignancy or  pyoderma gangrenosum. She would continue to see as in the wound clinic for further care and see Dr. Marzetta Board as needed. The patient brought the biopsy report and it was consistent with stasis ulcer no evidence of malignancy and the comment was that there was some adjacent neovascularization, fibrosis and patchy perivascular chronic inflammation. 11/15/2015 -- today we applied her first application of Theraskin 11/30/15; TheraSkin #2 [place], [name] ([id number]) [id number] Page 10 of 17 12/13/2015 -- she is having a lot of pain  locally and is here for possible application of a theraskin today. 01/16/2016 -- the patient has significant pain and has noticed despite in spite of all local care and oral pain medication. It is impossible to debride her in the office. 02/06/2016 -- I do not see any notes from Dr. Iran Planas( the patient has not made a call to the office know as she heard from them) and the only visit to recently was with her PCP Dr. Danella Penton -- I saw her on 01/16/2016 and prescribed 90 tablets of oxycodone 10 mg and did lab work and screening for HIV. the HIV was negative and hemoglobin was 6.3 with a WBC count of 14.9 and hematocrit of 17.8 with platelets of 561. 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 The bimalleolar ulcers to the right lower extremity have been present for approximately 2 years, the left medial malleolus ulcer has been present approximately 6 months. She has followed with Dr.Thimmappa in the past and has had a total of 3 applications of Theraskin (01/2015, 09/2015, 06/17/16). She has also followed with Dr. Con Memos here in the clinic and has received 2 applications of TheraSkin (11/10/15, 11/30/15). The patient does experience chronic, and is not amenable to debridement. She had a sickle cell crisis in December 2017, prior to that  has been several years. She is not currently on any antibiotic therapy and has not been treated with any recently. 07/17/2016 -- was seen by Dr. Iran Planas of plastic surgery who saw her 2 weeks postop application of Theraskin #3. She had removed her dressing and asked her to apply silver alginate on alternate days and follow-up back with the wound center. Future debridements and application of skin substitute would have to be done in the hospital due to her high risk for anesthesia. READMISSION 04/17/2021 Patient is now a 52 year old woman that we have had in this clinic for a prolonged period of time and 2016-2017 and then again for 2 visits in February 2018. At that point she had wounds on the right lower leg predominantly medial. She had also been seen by plastic surgery Dr. Leland Johns who I believe took her to the OR for operative debridement and application of TheraSkin in 2017. After she left our clinic she was followed for a very prolonged period of time in the wound care center in [facility] who then referred her ultimately to [facility] where she was seen by Dr. Vernona Rieger. Again taken her to the OR for skin grafting which apparently did not take. She had multiple other attempts at dressings although I have not really looked over all of these notes in great detail. She has not been seen in a wound care center in about a year. She states over the last year in addition to her right lower leg she has developed wounds on the left lower leg quite extensive. She is using Xeroform to all of these wounds without really any improvement. She also has Medicaid which does not cover wound products. The patient has had vascular work-ups in the past including most recently on 03/28/2021 showing biphasic waveforms on the right triphasic at the PTA and biphasic at the dorsalis pedis on the left. She was unable to tolerate any degree of compression to do ABIs. Unfortunately TBI's were also not done. She had venous reflux  studies done in 2017. This did not show any evidence of a DVT or SVT and  no venous incompetence was noted in the right leg at the time this was the only side with the wound As noted I did not look all over her old records. She apparently had a course of [facility] and [facility] although I am not sure what the indication would have been. In any case she developed seizures and terminated treatment earlier. She is generally much more disabled than when we last saw her in clinic. She can no longer walk pretty much wheelchair-bound because predominantly of pain in the left hip. 04/24/2021; the patient tolerated the wraps we put on. We used Santyl and Hydrofera Blue under compression. I brought her back for a nurse visit for a change in dressing. With Medicaid we will have a hard time getting anything paid for and hence the need for compression. She arrives in clinic with all the wounds looking somewhat better in terms of surface 12/20; circumferential wound on the right from the lateral to the medial. She has open areas on the left medial and left lateral x2 on all of this with the same surface. This does not look completely healthy although she does have some epithelialization. She is not complaining of a lot of pain which is unusual for her sickle ulcers. I have not looked over her extensive records from [facility]. She had recent arterial studies and has a history of venous reflux studies I will need to look these over although I do not believe she has significant arterial disease 2023 05/22/2021; patient's wound areas measure slightly smaller. Still a lot of drainage coming from the right we have been using Hydrofera Blue and Santyl with some improvement in the wound surfaces. She tells me she will be getting transfused later in the week for her underlying sickle cell anemia I have looked over her recent arterial studies which were done in the fall. This was in November and showed biphasic and triphasic waveforms  but she could not tolerate ABIs because of pressure and unfortunately TBI's were not done. She has not had recent venous reflux studies that I can see 1/10; not much change about the same surface area. This has a yellowish surface to it very gritty. We have been using Santyl and Hydrofera Blue for a prolonged period. Culture I did last week showed methicillin sensitive staph aureus "rare". Our intake nurse reports greenish drainage which may be the Hydrofera Blue itself 1/17; wounds are continue to measure smaller although I am not sure about the accuracy here. Especially the areas on the right are covered in what looks to be a nonviable surface although she does have some epithelialization. Similarly she has areas on the left medial and left lateral ankle area which appear to have a better surface and perhaps are slightly smaller. We have been using Santyl and Hydrofera Blue. She cannot tolerate mechanical debridements She went for her reflux studies which showed significant reflux at the greater saphenous vein at the saphenofemoral junction as well as the greater saphenous vein in the proximal calf on the left she had reflux in the thigh and the common femoral vein and supra vein [name] vein reflux in the greater saphenous vein. I will have vein and vascular look at this. My thoughts have been that these are likely sickle wounds. I looked through her old records from [facility] wound care center and then when she graduated to [facility] wound care center where she saw Dr. Zigmund Daniel and Dr. Vernona Rieger. Although I can see she had reflux studies  done I do not see that she actually saw a vein and vascular. I went over the fact that she had operative debridements and actual skin grafting that did not take. I do not think these wounds have ever really progressed towards healing 1/31;Substantial wounds on the right ankle area. Hyper granulated very gritty adherent debris on the surface. 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 On the left she has no DVT or SVT deep vein reflux in the common femoral vein. Superficial vein reflux in the greater saphenous vein on although the vein diameter is not really all that large. I do not think there is anything that can be done with these although I am going to send her for consultation to vein and vascular. 2/7; Wound exam; substantial wound area on the right posterior ankle area and areas on the left medial ankle and left lateral ankle. I was able to debride the left medial ankle last week fairly aggressively and it is back this week to a completely nonviable surface She will see vascular surgery this Friday and I would like them to review the venous studies and also any comments on her arterial status. If they do not see an issue here I am going to refer her to plastic surgery for an operative debridement perhaps intraoperative ACell or Integra. Eventually she will require a deep tissue culture again 2/14; substantial wound area on the right posterior ankle, medial ankle. We have been using silver alginate The patient was seen by vein and vascular she had both venous reflux studies and arterial studies. 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 She had no evidence of superficial venous thrombosis. She did have superficial vein reflux at AIESHA, CONSTANTINEAU ([id number]) [id number] Page 11 of 17 the saphenofemoral junction and the greater saphenous vein. On the left no evidence of a DVT no evidence of  superficial venous throat thrombosis she did have deep vein reflux in the common femoral vein and superficial vein reflux in the greater saphenous vein but these were not felt to be amenable to ablation. In terms of arterial studies she had triphasic and wife biphasic wave waveforms bilaterally not felt to have a significant arterial issue. I do not get the feeling that they felt that any part of her nonhealing wounds were related to either arterial or venous issues. They did note that she had venous reflux at the right at the [facility] and [facility]. And also on the left there were reflux in the deep system at the common femoral vein and greater saphenous vein in the proximal thigh. Nothing amenable to ablation. 2/20; she is making some decent progress on the right where there is nice skin between the 2 open areas on the right ankle. The surfaces here do not look viable yet there is some surrounding epithelialization. She still has a small area on the left medial ankle area. Hyper-granulated Jody's away always 2/28 patient has an appointment with plastic surgery on 3/8. We will see her back on 3/9. She may have to call  us to get the area redressed. We've been using Santyl under silver alginate. We made a nice improvement on the left medial ankle. The larger wounds on the right also looks somewhat better in terms of epithelialization although I think they could benefit from an aggressive debridement if plastic surgery would be willing to do that. Perhaps placement of Integra or a cell 07/26/2021: She saw Dr. Arita Miss yesterday. He raised the question as to whether or not this might be pyoderma and wanted to wait until that question was answered by dermatology before proceeding with any sort of operative debridement. We have continue to use Santyl under silver alginate with Kerlix and Coban wraps. Overall, her wounds appear to be continuing to contract and epithelialize, with some granulation tissue present. There  continues to be some slough on all wound surfaces. 08/09/2021: She has not been able to get an appointment with dermatology because apparently the offices in [facility] and [place] do not accept Medicaid. She is looking into whether or not she can be seen at the main [facility] dermatology clinic. This is necessary because plastic surgery is concerned that her wounds might represent pyoderma and they did not want to do any procedure until that was clarified. We have been using Santyl under silver alginate with Kerlix and Coban wraps. Today, there was a greater amount of drainage on her dressings with a slight green discoloration and significant odor. Despite this, her wounds continue to contract and epithelialize. There is pale granulation tissue present and actually, on the left medial ankle, the granulation tissue is a bit hypertrophic. 08/16/2021: Last week, I took a culture and this grew back rare methicillin-resistant Staph aureus and rare corynebacterium. The MRSA was sensitive to gentamicin which we began applying topically on an empiric basis. This week, her wounds are a bit smaller and the drainage and odor are less. Her primary care provider is working on assisting the patient with a dermatology evaluation. She has been in silver alginate over the gentamicin that was started last week along with Kerlix and Coban wraps. 08/23/2021: Because she has Medicaid, we have been unable to get her into see any dermatologist in the [place] to rule out pyoderma gangrenosum, which was a requirement from plastic surgery prior to any sort of debridement and grafting. Despite this, however, all of her wounds continue to get smaller. The wound on her left medial ankle is nearly closed. There is no odor from the wounds, although she still accumulates a modest amount of drainage on her dressings. 08/30/2021: The lateral right ankle wound and the medial left ankle wound are a bit smaller today. The medial  right ankle wound is about the same size. They are less tender. We have still been unable to get her into dermatology. 09/06/2021: All of the wounds are about the same size today. She continues to endorse minimal pain. I communicated with Dr. Arita Miss in plastic surgery regarding our issues getting a dermatology appointment; he was out of town but indicated that he would look into perhaps performing the biopsy in his office and will have his office contact her. 09/14/2021: The patient has an appointment in dermatology, but it is not until October. Her wounds are roughly the same; she continues to have very thick purulent-looking drainage on her dressings. 09/20/2021: The left medial wound is nearly closed and just has a bit of accumulated eschar on the surface. The right medial and lateral ankle wounds are perhaps a little bit smaller. They continue  to have a very pale surface with accumulation of thin slough. 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 I did not think Jodie Echevaria was indicated based on this. She is getting topical mupirocin with Prisma silver collagen. 10/04/2021: The patient was not seen in clinic last week due to childcare coverage issues. In the interim, the left medial leg wound has closed. The right sided leg wounds are smaller. There is more granulation tissue coming through, particularly on the lateral wound. The surface remains somewhat gritty. We have been applying topical mupirocin and Prisma silver collagen. 10/11/2021: The left medial leg wound remains closed. She does complain of some anesthetic sensation to the area. Both of the right-sided leg wounds are smaller but still have accumulated slough. 10/18/2021: Both right-sided leg wounds are minimally smaller this week. She still continues to accumulate slough and has thick drainage on her dressings. 10/23/2021: Both wounds continue to contract. There is still slough buildup. She has been approved for a  keratin-based skin substitute trial product but it will not be available until next week. 10/30/2021: The wounds are about the same to perhaps slightly smaller. There is still continued slough buildup. Unfortunately, the rep for the keratin based product did not show up today and did not answer his phone when called. 11/08/2021: The wounds are little bit smaller today. She continues to have thick drainage but the surfaces are relatively clean with just a little bit of slough accumulation. She reported to me today that she is unable to completely flex her left ankle and on examination it seems this is potentially related to scar tissue from her wounds. We do have the ProgenaMatrix trial product available for her today. 11/15/2021: Both wounds are smaller today. There is some slough accumulation on the surfaces, but the medial wound, in particular looks like it is filling in and is less deep. She did hear from physical therapy and she is going to start working with them on July 11. She is here for her second application of the trial skin substitute, ProgenaMatrix. 11/22/2021: Both wounds continue to contract, the medial more dramatically than the lateral. Both wounds have a layer of slough on the surface, but underneath this, the gritty fibrous tissue has a little bit more of a pink cast to it rather than being as pale as it has been. 11/29/2021: The wounds are roughly the same size this week, perhaps a millimeter or 2 smaller. The medial wound has filled in and is nearly flush with the surrounding skin surface. She continues to have a lot of slough accumulation on both surfaces. 12/06/2021: No significant change to her wounds, but she has a new opening on her dorsal foot, just distal to the right lateral ankle wound. The area on her left medial ankle that reopened looks a little bit larger today. She has quite a bit of pain associated with the new wound. 12/12/2021: Her wounds look about the same but the new  opening on her right lateral dorsal foot is a little bit bigger. She continues to have a fair amount of pain with this wound. 12/26/2021: The left medial ankle wound is tiny and superficial. She has 2 areas of crusting on her left lateral ankle, however, that appear to be threatening to open again. Her right medial ankle wound is a little bit smaller today but still continues to accumulate thick rubbery slough. The new dorsal foot wound is exquisitely painful but there is no odor or purulent drainage.  No erythema or induration. The right lateral ankle wound looks about the same today, again with thick rubbery slough. 01/03/2022: The left medial ankle wound has closed again. Both right ankle wounds appear to be about the same size with thick rubbery slough. The dorsal foot wound on the right continues to be quite painful and she stated that she did not want any debridement of that site today. [name], [name] ([id number]) [id number] Page 12 of 17 01/10/2022: No real change to any of her wounds. She continues to accumulate thick slough. The dorsal foot wound has merged with the lateral malleolar wound. She is experiencing significant pain in the dorsal foot portion of the ulcer. 01/16/2022: Absolutely no change or progress in her wounds. 01/24/2022: Her wounds are unchanged. She continues to build up slough and the wounds on her dorsal right foot are still exquisitely tender. 01/31/2022: The wounds actually measure a little bit narrower today. They still have thick slough on the surface but the underlying tissue seems a little less fibrotic. We changed to Iodosorb last week. 02/07/2022: Wounds continue to slowly epithelialize around the parameters. She has less pain today. She still accumulates a fairly substantial layer of slough. 02/15/2022: No change to the wounds overall. The measured a little bit larger today per the intake nurse. She continues to have substantial slough  accumulation and her pain is a little bit worse. 02/21/2022. No change at all to her wounds. I do not see that plastic surgery has received her referral yet. 02/28/2022: The wounds remain unchanged. They are dry and fibrotic with accumulation of slough and eschar. She did receive an appointment to see plastic surgery on October 23, but the office called her back and indicated they needed to reschedule and that the next available appointment was not until December. The patient became angry and decided she did not want to see this plastics group. 10/19; the patient sees Dr. Maylene Roes again next week. She is using Medihoney as the primary dressing changing this herself. 03/21/2022: She saw Dr. Ferd Hibbs at the wound care center at [facility]. Dr. Ferd Hibbs was also unable to debride her, secondary to pain and is planning to take her to the operating room for operative debridement and potential skin substitute placement. Her wounds are unchanged with thick yellow slough and a fibrotic base. She says they are too painful to be debrided. In addition, yesterday her hemoglobin was 4 and she received 2 units packed red blood cell transfusion. 04/04/2022: Her operation at [facility] is scheduled for December 15. She continues to use Medihoney on her wounds. They are a little bit less painful today and she is willing to entertain the possibility of debridement. The wounds measured a little bit larger in all dimensions today but the layer of slough is not as thick as usual. 04/18/2022: Her wounds actually measured a little bit smaller today and the extension onto the dorsal foot from the lateral wound has healed. They are less tender and there is significantly less slough present as compared to prior visits. 05/09/2022: She had to reschedule her surgery due to lack of childcare. It is now planned for January 5. The wounds are basically unchanged, but she had a lot more drainage, which was thick  and somewhat purulent in appearance. No significant odor. 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 They are quite painful today and she does not want to have debridement performed. 05/30/2022: The patient underwent surgical debridement and placement of TheraSkin to  her wounds on January 5. She has been doing well and does not endorse any pain. The TheraSkin is intact and looks beautiful. It is sutured in place. There is no exudate or significant drainage. 06/04/2022: Her TheraSkin remains intact and I am starting to see granulation tissue emerging underneath the surface. No concern for infection. 06/12/2022: The TheraSkin is intact and there is more granulation tissue emerging. It is starting to look a little bit dry, however. No malodor or purulent drainage. 06/19/2022: The TheraSkin continues to look good. The edges are a bit dry, but the central portion of each of her wounds is showing a nice pink color. 06/27/2022: The TheraSkin on the lateral wound remains almost completely intact. There is nice pink tissue peaking through the mesh of the TheraSkin. On the medial leg, it is starting to breakdown a little bit, but most of the TheraSkin remains intact here, as well. Both wounds measured smaller today. 07/03/2022: The wounds are fairly dry around the perimeter today. Some of the suture is hanging loose. 07/12/2022: Both wounds are measuring smaller today. The medial ulcer is getting a bit too dry. The lateral ulcer continues to have nice buds of granulation tissue emerging. 07/26/2022: The moisture balance on both wounds is much better today. For the first time since I began seeing her, I see actual beefy red granulation tissue on the lateral ankle wound. There are more buds of deep pink granulation tissue emerging on the medial ankle. There is some dry eschar around the edges of the medial wound and a tiny amount of slough overlying the good granulation tissue on the lateral wound. 08/09/2022:  Unfortunately, she has a new wound on her left lateral lower leg, just above the ankle. It is extremely painful for her. It penetrates to the fat layer and has the same woody, fibrotic character as her previous wounds. The other wounds are both a little bit smaller with slough and some eschar around the periphery. 08/14/2022: Her right lateral ankle wound is a little bit smaller. Unfortunately, she has had an increase in her drainage and it has a purulent nature to it, similar to what was present prior to her surgical debridement. She also reports odor coming from her wounds this week. The left lateral leg wound remains extremely tender. 08/21/2022: The left lateral ankle wound is larger and more painful today. Both right ankle wounds have a little bit more vital appearance, but they have accumulated more slough. She reports less odor this week. She is currently taking Augmentin. 08/28/2022: The left lateral ankle wound continues to enlarge and remains quite painful. Both of the right ankle wounds actually look better this week. There is improved tissue quality with less slough accumulation. 09/27/2022: The left lateral ankle wound actually looks nearly healed. She says that she has been applying some silver alginate that she had leftover. She was unable to afford the Santyl that was prescribed, but found a tube that she had on hand at home and has been using this on her right ankle wounds. These wounds look about the same and have accumulated the usual layer of slough. Unfortunately, a portion of the dorsal foot wound has reopened and is exquisitely painful today. 10/09/2022: The left lateral ankle wound is healed. The dorsal foot wound appears to have expanded and remains exquisitely painful. The medial and lateral right ankle wounds look about the same size, but they are quite a bit cleaner. 10/23/2022: The left lateral ankle remains closed. The dorsal foot wound  has expanded to the point that it now  converges with the right lateral leg wound. She has accumulated fairly thick slough on all of the open surfaces. 11/06/2022: No significant change to any of her wounds, although the dorsal foot aspect of the right lateral wound is a little bit smaller. She has been approved for the [employer] and [employer] for [employer] and will be able to get a full year's supply. 11/20/2022: The dorsal foot aspect of the right lateral wound has almost completely closed. I am seeing nice pink tissue starting to emerge from the fibrotic surface of both wounds. The medial leg wound appears to be a little bit smaller today. She has been using Santyl. KARELLY, GARRATT ([id number]) [id number] Page 13 of 17 Patient History Information obtained from Patient. Family History Diabetes - Mother, Lung Disease - Mother, No family history of Cancer, Heart Disease, Hereditary Spherocytosis, Hypertension, Kidney Disease, Seizures, Stroke, Thyroid Problems, Tuberculosis. Social History Never smoker, Marital Status - Married, Alcohol Use - Never, Drug Use - No History, Caffeine Use - Daily. Medical History Eyes Denies history of Cataracts, Glaucoma, Optic Neuritis Ear/Nose/Mouth/Throat Denies history of Chronic sinus problems/congestion, Middle ear problems Hematologic/Lymphatic Patient has history of Anemia, Sickle Cell Disease Denies history of Hemophilia, Human Immunodeficiency Virus, Lymphedema Respiratory Denies history of Aspiration, Asthma, Chronic Obstructive Pulmonary Disease (COPD), Pneumothorax, Sleep Apnea, Tuberculosis Cardiovascular Denies history of Angina, Arrhythmia, Congestive Heart Failure, Coronary Artery Disease, Deep Vein Thrombosis, Hypertension, Hypotension, Myocardial Infarction, Peripheral Arterial Disease, Peripheral Venous Disease, Phlebitis, Vasculitis Gastrointestinal Denies history of Cirrhosis , Colitis, Crohns, Hepatitis A, Hepatitis B, Hepatitis  C Endocrine Denies history of Type I Diabetes, Type II Diabetes Genitourinary Denies history of End Stage Renal Disease Immunological Denies history of Lupus Erythematosus, Raynauds, Scleroderma Integumentary (Skin) Denies history of History of Burn Musculoskeletal Denies history of Gout, Rheumatoid Arthritis, Osteoarthritis, Osteomyelitis Neurologic Patient has history of Neuropathy - right foot intermittant Denies history of Dementia, Quadriplegia, Paraplegia, Seizure Disorder Oncologic Denies history of Received Chemotherapy, Received Radiation Psychiatric Denies history of Anorexia/bulimia, Confinement Anxiety Hospitalization/Surgery History - c section x2. - left breast lumpectomy. - iandD right ankle with theraskin. Medical A Surgical History Notes nd Constitutional Symptoms (General Health) H/O miscarriage Cardiovascular bradycardia Gastrointestinal cholilithiasis Objective Constitutional no acute distress. Vitals Time Taken: 8:47 AM, Height: 67 in, Weight: 134 lbs, BMI: 21, Temperature: 97.8 F, Pulse: 68 bpm, Respiratory Rate: 20 breaths/min, Blood Pressure: 113/70 mmHg. Respiratory Normal work of breathing on room air. General Notes: 11/20/2022: The dorsal foot aspect of the right lateral wound has almost completely closed. I am seeing nice pink tissue starting to emerge from the fibrotic surface of both wounds. The medial leg wound appears to be a little bit smaller today. Integumentary (Hair, Skin) Wound #17 status is Open. Original cause of wound was Gradually Appeared. The date acquired was: 10/05/2012. The wound has been in treatment 83 weeks. The wound is located on the Right,Lateral Lower Leg. The wound measures 7.4cm length x 7cm width x 0.1cm depth; 40.684cm^2 area and 4.068cm^3 volume. There is Fat Layer (Subcutaneous Tissue) exposed. There is no tunneling or undermining noted. There is a medium amount of serosanguineous drainage noted. The wound margin is  distinct with the outline attached to the wound base. There is small (1-33%) pink granulation within the wound bed. There is a large (67- 100%) amount of necrotic tissue within the wound bed including Adherent Slough. The periwound skin appearance had no abnormalities noted for moisture. The periwound skin appearance  exhibited: Scarring, Hemosiderin Staining. The periwound skin appearance did not exhibit: Ecchymosis. Periwound temperature was noted as No Abnormality. The periwound has tenderness on palpation. Wound #21 status is Open. Original cause of wound was Gradually Appeared. The date acquired was: 06/26/2021. The wound has been in treatment 73 weeks. The wound is located on the Right,Medial Ankle. The wound measures 8.2cm length x 3.6cm width x 0.1cm depth; 23.185cm^2 area and 2.318cm^3 volume. [name], [name] ([id number]) [id number] Page 14 of 17 There is Fat Layer (Subcutaneous Tissue) exposed. There is no tunneling or undermining noted. There is a medium amount of serosanguineous drainage noted. The wound margin is distinct with the outline attached to the wound base. There is small (1-33%) pink granulation within the wound bed. There is a large (67- 100%) amount of necrotic tissue within the wound bed including Adherent Slough. The periwound skin appearance had no abnormalities noted for moisture. The periwound skin appearance had no abnormalities noted for color. The periwound skin appearance exhibited: Scarring. Periwound temperature was noted as No Abnormality. The periwound has tenderness on palpation. 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 There was a Selective/Open Wound Non-Viable Tissue Debridement with a total  area of 32.53 sq cm performed by Duanne Guess, MD. With the following instrument(s): Curette to remove Non-Viable tissue/material. Material removed includes [facility] after achieving pain control using Lidocaine 4% Topical Solution. No specimens were taken. A time out was conducted at 09:17, prior to the start of the procedure. A Minimum amount of bleeding was controlled with Pressure. The procedure was tolerated well. Post Debridement Measurements: 7.4cm length x 7cm width x 0.1cm depth; 4.068cm^3 volume. Character of Wound/Ulcer Post Debridement is improved. Post procedure Diagnosis Wound #17: Same as Pre-Procedure General Notes: scribed for Dr. Lady Gary by Samuella Bruin, RN. Wound #21 Pre-procedure diagnosis of Wound #21 is a Sickle Cell Lesion located on the Right,Medial Ankle .Severity of Tissue Pre Debridement is: Fat layer exposed. There was a Selective/Open Wound Non-Viable Tissue Debridement with a total area of 23.17 sq cm performed by Duanne Guess, MD. With the following instrument(s): Curette to remove Non-Viable tissue/material. Material removed includes [facility] after achieving pain control using Lidocaine 4% Topical Solution. No specimens were taken. A time out was conducted at 09:17, prior to the start of the procedure. A Minimum amount of bleeding was controlled with Pressure. The procedure was tolerated well. Post Debridement Measurements: 8.2cm length x 3.6cm width x 0.1cm depth; 2.318cm^3 volume. Character of Wound/Ulcer Post Debridement is improved. Severity of Tissue Post Debridement is: Fat layer exposed. Post procedure Diagnosis Wound #21: Same as Pre-Procedure General Notes: scribed for Dr. Lady Gary by Samuella Bruin, RN. Plan Follow-up Appointments: Return Appointment in 2 weeks. - Dr. Lady Gary Rm 2 Anesthetic: (In clinic) Topical Lidocaine 4% applied to wound bed Bathing/ Shower/ Hygiene: May shower and wash wound with soap and water. Edema Control - Lymphedema /  SCD / Other: Elevate legs to the level of the heart or above for 30 minutes daily and/or when sitting for 3-4 times a day throughout the day. Avoid standing for long periods of time. Exercise regularly Additional Orders / Instructions: Follow Nutritious Diet Juven Shake 1-2 times daily. The following medication(s) was prescribed: lidocaine topical 4 % cream cream topical was prescribed at facility WOUND #17: - Lower Leg Wound  Laterality: Right, Lateral Cleanser: Soap and Water 1 x Per Week/30 Days Discharge Instructions: May shower and wash wound with dial antibacterial soap and water prior to dressing change. Cleanser: Wound Cleanser 1 x Per Week/30 Days Discharge Instructions: Cleanse the wound with wound cleanser prior to applying a clean dressing using gauze sponges, not tissue or cotton balls. Peri-Wound Care: Triamcinolone 15 (g) 1 x Per Week/30 Days Discharge Instructions: Use triamcinolone 15 (g) as directed Peri-Wound Care: Sween Lotion (Moisturizing lotion) 1 x Per Week/30 Days Discharge Instructions: Apply moisturizing lotion as directed Prim Dressing: Santyl Ointment 1 x Per Week/30 Days ary Discharge Instructions: Apply nickel thick amount to wound bed as instructed Secondary Dressing: ABD Pad, 5x9 1 x Per Week/30 Days Discharge Instructions: Apply over primary dressing as directed. Secondary Dressing: Woven Gauze Sponge, Non-Sterile 4x4 in 1 x Per Week/30 Days Discharge Instructions: Moisten with normal saline and Apply over primary dressing as directed. Secured With: Elastic Bandage 4 inch (ACE bandage) 1 x Per Week/30 Days Discharge Instructions: Secure with ACE bandage as directed. Secured With: [employer], 4.5x3.1 (in/yd) 1 x Per Week/30 Days Discharge Instructions: Secure with Kerlix as directed. WOUND #21: - Ankle Wound Laterality: Right, Medial Cleanser: Soap and Water 1 x Per Week/30 Days Discharge Instructions: May shower and wash wound with dial  antibacterial soap and water prior to dressing change. Cleanser: Wound Cleanser 1 x Per Week/30 Days FENIX, HILLMON ([id number]) [id number] Page 15 of 17 Discharge Instructions: Cleanse the wound with wound cleanser prior to applying a clean dressing using gauze sponges, not tissue or cotton balls. Peri-Wound Care: Triamcinolone 15 (g) 1 x Per Week/30 Days Discharge Instructions: Use triamcinolone 15 (g) as directed Peri-Wound Care: Sween Lotion (Moisturizing lotion) 1 x Per Week/30 Days Discharge Instructions: Apply moisturizing lotion as directed Prim Dressing: Santyl Ointment 1 x Per Week/30 Days ary Discharge Instructions: Apply nickel thick amount to wound bed as instructed Secondary Dressing: ABD Pad, 5x9 1 x Per Week/30 Days Discharge Instructions: Apply over primary dressing as directed. Secondary Dressing: Woven Gauze Sponge, Non-Sterile 4x4 in 1 x Per Week/30 Days Discharge Instructions: Moisten with normal saline and Apply over primary dressing as directed. Secured With: Elastic Bandage 4 inch (ACE bandage) 1 x Per Week/30 Days Discharge Instructions: Secure with ACE bandage as directed. Secured With: [employer], 4.5x3.1 (in/yd) 1 x Per Week/30 Days Discharge Instructions: Secure with Kerlix as directed. 11/20/2022: The dorsal foot aspect of the right lateral wound has almost completely closed. I am seeing nice pink tissue starting to emerge from the fibrotic surface of both wounds. The medial leg wound appears to be a little bit smaller today. I used a curette to debride the slough from both of her wounds. This is the first time in ages that she has permitted me to clean the entire surface of her wounds. We will continue to apply Santyl with a Kerlix and Ace bandage wrap. She will follow-up in 2 weeks. Electronic Signature(s) Signed: 11/20/2022 9:55:34 AM By: Duanne Guess MD FACS Entered By: Duanne Guess on 11/20/2022  09:55:34 -------------------------------------------------------------------------------- HxROS Details Patient Name: Date of Service: [name], [name] 11/20/2022 8:30 A M Medical Record Number: [id number] Patient Account Number: [id number] Date of Birth/Sex: Treating RN: [birth year] (52 y.o. F) Primary Care Provider: Julianne Handler Other Clinician: Referring Provider: Treating Provider/Extender: Koleen Nimrod Weeks in Treatment: 52 Information Obtained From Patient Constitutional Symptoms (General Health) Medical History: Past Medical History Notes: H/O miscarriage Eyes Medical History: Negative  for: Cataracts; Glaucoma; Optic Neuritis Ear/Nose/Mouth/Throat Medical History: Negative for: Chronic sinus problems/congestion; Middle ear problems Hematologic/Lymphatic Medical History: Positive for: Anemia; Sickle Cell Disease Negative for: Hemophilia; Human Immunodeficiency Virus; Lymphedema Respiratory Medical History: Negative for: Aspiration; Asthma; Chronic Obstructive Pulmonary Disease (COPD); Pneumothorax; Sleep Apnea; Tuberculosis Cardiovascular [name], [name] ([id number]) [id number] Page 16 of 17 Medical History: Negative for: Angina; Arrhythmia; Congestive Heart Failure; Coronary Artery Disease; Deep Vein Thrombosis; Hypertension; Hypotension; Myocardial Infarction; Peripheral Arterial Disease; Peripheral Venous Disease; Phlebitis; Vasculitis Past Medical History Notes: bradycardia Gastrointestinal Medical History: Negative for: Cirrhosis ; Colitis; Crohns; Hepatitis A; Hepatitis B; Hepatitis C Past Medical History Notes: cholilithiasis Endocrine Medical History: Negative for: Type I Diabetes; Type II Diabetes Genitourinary Medical History: Negative for: End Stage Renal Disease Immunological Medical History: Negative for: Lupus Erythematosus; Raynauds; Scleroderma Integumentary (Skin) Medical History: Negative  for: History of Burn Musculoskeletal Medical History: Negative for: Gout; Rheumatoid Arthritis; Osteoarthritis; Osteomyelitis Neurologic Medical History: Positive for: Neuropathy - right foot intermittant Negative for: Dementia; Quadriplegia; Paraplegia; Seizure Disorder Oncologic Medical History: Negative for: Received Chemotherapy; Received Radiation Psychiatric Medical History: Negative for: Anorexia/bulimia; Confinement Anxiety Immunizations Pneumococcal Vaccine: Received Pneumococcal Vaccination: No Implantable Devices None Hospitalization / Surgery History Type of Hospitalization/Surgery c section x2 left breast lumpectomy iandD right ankle with theraskin Family and Social History Cancer: No; Diabetes: Yes - Mother; Heart Disease: No; Hereditary Spherocytosis: No; Hypertension: No; Kidney Disease: No; Lung Disease: Yes - Mother; Seizures: No; Stroke: No; Thyroid Problems: No; Tuberculosis: No; Never smoker; Marital Status - Married; Alcohol Use: Never; Drug Use: No History; Caffeine Use: Daily; Financial Concerns: No; Food, Clothing or Shelter Needs: No; Support System Lacking: No; Transportation Concerns: No Electronic Signature(s) Signed: 11/20/2022 4:41:42 PM By: Duanne Guess MD FACS Entered By: Duanne Guess on 11/20/2022 09:41:05 [name] ([id number]) [id number] Page 17 of 17 -------------------------------------------------------------------------------- SuperBill Details Patient Name: Date of Service: [name] [name] 11/20/2022 Medical Record Number: [id number] Patient Account Number: [id number] Date of Birth/Sex: Treating RN: Jan 19, 1971 (52 y.o. 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 Facility Procedures : CPT4 Code: 08657846 Description: [phone] - DEBRIDE WOUND 1ST 20 SQ CM OR < ICD-10 Diagnosis Description L97.818 Non-pressure chronic ulcer of other part of right lower leg with other specified s Modifier: everity Quantity: 1 : CPT4 Code: [id number] Description: 01027 - DEBRIDE WOUND EA ADDL 20 SQ CM ICD-10 Diagnosis Description L97.818 Non-pressure chronic ulcer of other part of right lower leg with other specified s Modifier: everity Quantity: 2 Physician Procedures : CPT4 Code Description Modifier [id number] - WC PHYS LEVEL 4 - EST PT 25 ICD-10 Diagnosis Description L97.818 Non-pressure chronic ulcer of other part of right lower leg with other specified severity 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

## 2022-11-26 ENCOUNTER — Telehealth: Payer: Self-pay | Admitting: Family Medicine

## 2022-11-26 ENCOUNTER — Other Ambulatory Visit: Payer: Self-pay | Admitting: Family Medicine

## 2022-11-26 DIAGNOSIS — G894 Chronic pain syndrome: Secondary | ICD-10-CM

## 2022-11-26 MED ORDER — OXYCODONE HCL 10 MG PO TABS
10.0000 mg | ORAL_TABLET | ORAL | 0 refills | Status: DC
Start: 2022-11-26 — End: 2022-12-24

## 2022-11-26 NOTE — Telephone Encounter (Signed)
Caller & Relationship to patient:  MRN #  [id number]   Call Back Number:   Date of Last Office Visit: 10/23/2022     Date of Next Office Visit: 12/24/2022    Medication(s) to be Refilled: Oxycodone   Preferred Pharmacy:   ** Please notify patient to allow 48-72 hours to process** **Let patient know to contact pharmacy at the end of the day to make sure medication is ready. ** **If patient has not been seen in a year or longer, book an appointment **Advise to use MyChart for refill requests OR to contact their pharmacy

## 2022-11-26 NOTE — Progress Notes (Signed)
Reviewed PDMP substance reporting system prior to prescribing opiate medications. No inconsistencies noted.    Meds ordered this encounter  Medications   Oxycodone HCl 10 MG TABS    Sig: Take 1 tablet (10 mg total) by mouth every 4 (four) hours while awake.    Dispense:  90 tablet    Refill:  0    Order Specific Question:   Supervising Provider    Answer:   JEGEDE, OLUGBEMIGA E [[id number]]    [name] [name] Tiano  APRN, MSN, FNP-C [facility] [address]  [place], [place] [address] [phone]  

## 2022-11-28 ENCOUNTER — Telehealth: Payer: Self-pay | Admitting: Family Medicine

## 2022-11-28 ENCOUNTER — Other Ambulatory Visit: Payer: Self-pay | Admitting: Family Medicine

## 2022-11-28 NOTE — Telephone Encounter (Signed)
Caller & Relationship to patient:  MRN #  [id number]   Call Back Number:   Date of Last Office Visit: 11/26/2022     Date of Next Office Visit: 12/24/2022    Medication(s) to be Refilled: Oxycodone  Preferred Pharmacy:   ** Please notify patient to allow 48-72 hours to process** **Let patient know to contact pharmacy at the end of the day to make sure medication is ready. ** **If patient has not been seen in a year or longer, book an appointment **Advise to use MyChart for refill requests OR to contact their pharmacy

## 2022-12-04 ENCOUNTER — Encounter (HOSPITAL_BASED_OUTPATIENT_CLINIC_OR_DEPARTMENT_OTHER): Payer: Medicaid Other | Admitting: General Surgery

## 2022-12-04 DIAGNOSIS — L97818 Non-pressure chronic ulcer of other part of right lower leg with other specified severity: Secondary | ICD-10-CM | POA: Diagnosis not present

## 2022-12-04 NOTE — Progress Notes (Signed)
HOLLYANNE, SCHLOESSER ([id number]) [id number] Page 1 of 9 Visit Report for 12/04/2022 Arrival Information Details Patient Name: Date of Service: [name] [name] 12/04/2022 8:30 A M Medical Record Number: [id number] Patient Account Number: [id number] Date of Birth/Sex: Treating RN: 1970/12/05 (52 y.o. [name], [name] Primary Care Collin Rengel: Julianne Handler Other Clinician: Referring Kamarion Zagami: Treating Kemal Amores/Extender: Koleen Nimrod Weeks in Treatment: 51 Visit Information History Since Last Visit Added or deleted any medications: No Patient Arrived: [name] Any new allergies or adverse reactions: No Arrival Time: 08:24 Had a fall or experienced change in No Accompanied By: self activities of daily living that may affect Transfer Assistance: None risk of falls: Patient Identification Verified: Yes Signs or symptoms of abuse/neglect since last visito No Secondary Verification Process Completed: Yes Hospitalized since last visit: No Patient Requires Transmission-Based Precautions: No Implantable device outside of the clinic excluding No Patient Has Alerts: No cellular tissue based products placed in the center since last visit: Has Dressing in Place as Prescribed: Yes Has Compression in Place as Prescribed: Yes Pain Present Now: Yes Electronic Signature(s) Signed: 12/04/2022 4:37:28 PM By: Samuella Bruin Entered By: Samuella Bruin on 12/04/2022 08:24:52 -------------------------------------------------------------------------------- Encounter Discharge Information Details Patient Name: Date of Service: [name], [name] 12/04/2022 8:30 A M Medical Record Number: [id number] Patient Account Number: [id number] Date of Birth/Sex: Treating RN: February 25, 1971 (52 y.o. [name] Primary Care Daysen Gundrum: Julianne Handler Other Clinician: Referring Jaymere Alen: Treating Raylei Losurdo/Extender: Koleen Nimrod Weeks in  Treatment: 37 Encounter Discharge Information Items Post Procedure Vitals Discharge Condition: Stable Temperature (F): 98.1 Ambulatory Status: Cane Pulse (bpm): 75 Discharge Destination: Home Respiratory Rate (breaths/min): 16 Transportation: Private Auto Blood Pressure (mmHg): 121/70 Accompanied By: self Schedule Follow-up Appointment: Yes Clinical Summary of Care: Patient Declined Electronic Signature(s) Signed: 12/04/2022 4:37:28 PM By: Samuella Bruin Entered By: Samuella Bruin on 12/04/2022 09:02:11 [name] ([id number]) [id number] Page 2 of 9 -------------------------------------------------------------------------------- Lower Extremity Assessment Details Patient Name: Date of Service: [name] [name] 12/04/2022 8:30 A M Medical Record Number: [id number] Patient Account Number: [id number] Date of Birth/Sex: Treating RN: [birth year] (52 y.o. [name] Primary Care Peta Peachey: Julianne Handler Other Clinician: Referring Erie Radu: Treating Sherah Lund/Extender: Koleen Nimrod Weeks in Treatment: 38 Edema Assessment Assessed: [Left: No] [Right: No] Edema: [Left: N] [Right: o] Calf Left: Right: Point of Measurement: 33 cm From Medial Instep 31 cm Ankle Left: Right: Point of Measurement: 10 cm From Medial Instep 20 cm Vascular Assessment Pulses: Dorsalis Pedis Palpable: [Right:Yes] Electronic Signature(s) Signed: 12/04/2022 4:37:28 PM By: Samuella Bruin Entered By: Samuella Bruin on 12/04/2022 08:29:27 -------------------------------------------------------------------------------- Multi Wound Chart Details Patient Name: Date of Service: [name], [name] 12/04/2022 8:30 A M Medical Record Number: [id number] Patient Account Number: [id number] Date of Birth/Sex: Treating RN: 1970-06-12 (52 y.o. F) Primary Care Emilianna Barlowe: Julianne Handler Other Clinician: Referring Marquail Bradwell: Treating  Damari Hiltz/Extender: Koleen Nimrod Weeks in Treatment: 25 Vital Signs Height(in): 67 Pulse(bpm): 75 Weight(lbs): 134 Blood Pressure(mmHg): 121/70 Body Mass Index(BMI): 21 Temperature(F): 98.1 Respiratory Rate(breaths/min): 16 [17:Photos:] [N/A:N/A [id number] Page 3 of 9] Right, Lateral Lower Leg Right, Medial Ankle N/A Wound Location: Gradually Appeared Gradually Appeared N/A Wounding Event: Sickle Cell Lesion Sickle Cell Lesion N/A Primary Etiology: N/A Venous Leg Ulcer N/A Secondary Etiology: Anemia, Sickle Cell Disease, Anemia, Sickle Cell Disease, N/A Comorbid History: Neuropathy Neuropathy 10/05/2012 06/26/2021 N/A Date Acquired: 2 75 N/A Weeks of Treatment: Open Open N/A Wound Status: No No N/A Wound Recurrence: Yes No N/A  Clustered Wound: 8x7x0.1 8.5x3.8x0.1 N/A Measurements L x W x D (cm) 43.982 25.368 N/A A (cm) : rea 4.398 2.537 N/A Volume (cm) : 76.60% 12.50% N/A % Reduction in A rea: 76.60% 12.50% N/A % Reduction in Volume: Full Thickness Without Exposed Full Thickness Without Exposed N/A Classification: Support Structures Support Structures Medium Medium N/A Exudate A mount: Serosanguineous Serosanguineous N/A Exudate Type: red, brown red, brown N/A Exudate Color: Distinct, outline attached Distinct, outline attached N/A Wound Margin: Small (1-33%) Small (1-33%) N/A Granulation A mount: Pink Pink N/A Granulation Quality: Large (67-100%) Large (67-100%) N/A Necrotic A mount: Fat Layer (Subcutaneous Tissue): Yes Fat Layer (Subcutaneous Tissue): Yes N/A Exposed Structures: Fascia: No Fascia: No Tendon: No Tendon: No Muscle: No Muscle: No Joint: No Joint: No Bone: No Bone: No Small (1-33%) Small (1-33%) N/A Epithelialization: Debridement - Selective/Open Wound Debridement - Selective/Open Wound N/A Debridement: Pre-procedure Verification/Time Out 08:48 08:48 N/A Taken: Lidocaine 4%  Topical Solution Lidocaine 4% Topical Solution N/A Pain Control: [facility] N/A Tissue Debrided: Non-Viable Tissue Non-Viable Tissue N/A Level: 32.97 17.75 N/A Debridement A (sq cm): rea Curette Curette N/A Instrument: Minimum Minimum N/A Bleeding: Pressure Pressure N/A Hemostasis A chieved: Procedure was tolerated well Procedure was tolerated well N/A Debridement Treatment Response: 8x7x0.1 8.5x3.8x0.1 N/A Post Debridement Measurements L x W x D (cm) 4.398 2.537 N/A Post Debridement Volume: (cm) Scarring: Yes Scarring: Yes N/A Periwound Skin Texture: No Abnormalities Noted No Abnormalities Noted N/A Periwound Skin Moisture: Hemosiderin Staining: Yes No Abnormalities Noted N/A Periwound Skin Color: Ecchymosis: No No Abnormality No Abnormality N/A Temperature: Yes Yes N/A Tenderness on Palpation: Debridement Debridement N/A Procedures Performed: Treatment Notes Electronic Signature(s) Signed: 12/04/2022 8:51:52 AM By: Duanne Guess MD FACS Entered By: Duanne Guess on 12/04/2022 08:51:52 -------------------------------------------------------------------------------- Multi-Disciplinary Care Plan Details Patient Name: Date of Service: [name], [name] 12/04/2022 8:30 A M Medical Record Number: [id number] Patient Account Number: [id number] Date of Birth/Sex: Treating RN: 1970/10/07 (52 y.o. [name] Primary Care Silvina Hackleman: Julianne Handler Other Clinician: Referring Aaren Atallah: Treating Marilin Kofman/Extender: Koleen Nimrod Weeks in Treatment: 60 Multidisciplinary Care Plan reviewed with physician IRIA, JAMERSON ([id number]) [id number] Page 4 of 9 Active Inactive Necrotic Tissue Nursing Diagnoses: Impaired tissue integrity related to necrotic/devitalized tissue Knowledge deficit related to management of necrotic/devitalized tissue Goals: Necrotic/devitalized tissue will be minimized in the wound  bed Date Initiated: 12/04/2022 Target Resolution Date: 01/24/2023 Goal Status: Active Patient/caregiver will verbalize understanding of reason and process for debridement of necrotic tissue Date Initiated: 12/04/2022 Target Resolution Date: 01/24/2023 Goal Status: Active Interventions: Assess patient pain level pre-, during and post procedure and prior to discharge Provide education on necrotic tissue and debridement process Treatment Activities: Apply topical anesthetic as ordered : 12/04/2022 Enzymatic debridement : 12/04/2022 Notes: Venous Leg Ulcer Nursing Diagnoses: Actual venous Insuffiency (use after diagnosis is confirmed) Knowledge deficit related to disease process and management Goals: Patient will maintain optimal edema control Date Initiated: 06/26/2021 Target Resolution Date: 12/20/2022 Goal Status: Active Interventions: Assess peripheral edema status every visit. Compression as ordered Treatment Activities: Therapeutic compression applied : 06/26/2021 Notes: Electronic Signature(s) Signed: 12/04/2022 4:37:28 PM By: Samuella Bruin Entered By: Samuella Bruin on 12/04/2022 08:47:47 -------------------------------------------------------------------------------- Pain Assessment Details Patient Name: Date of Service: [name], [name] 12/04/2022 8:30 A M Medical Record Number: [id number] Patient Account Number: [id number] Date of Birth/Sex: Treating RN: 11/08/1970 (52 y.o. [name] Primary Care Pragya Lofaso: Julianne Handler Other Clinician: Referring Dayveon Halley: Treating Myka Hitz/Extender: Koleen Nimrod Weeks in Treatment:  85 Active Problems Location of Pain Severity and Description of Pain Patient Has Paino Yes Site Locations Pain LocationJANEAL, ABADI ([id number]) [id number] Page 5 of 9 Pain Location: Pain in Ulcers Duration of the Pain. Constant / Intermittento Intermittent Rate the pain. Current Pain  Level: 5 Character of Pain Describe the Pain: Burning Pain Management and Medication Current Pain Management: Electronic Signature(s) Signed: 12/04/2022 4:37:28 PM By: Samuella Bruin Entered By: Samuella Bruin on 12/04/2022 08:23:47 -------------------------------------------------------------------------------- Patient/Caregiver Education Details Patient Name: Date of Service: [name], [name] 7/17/2024andnbsp8:30 A M Medical Record Number: [id number] Patient Account Number: [id number] Date of Birth/Gender: Treating RN: 1970/11/27 (52 y.o. [name] Primary Care Physician: Julianne Handler Other Clinician: Referring Physician: Treating Physician/Extender: Koleen Nimrod Weeks in Treatment: 32 Education Assessment Education Provided To: Patient Education Topics Provided Wound/Skin Impairment: Methods: Explain/Verbal Responses: Reinforcements needed, State content correctly Electronic Signature(s) Signed: 12/04/2022 4:37:28 PM By: Samuella Bruin Entered By: Samuella Bruin on 12/04/2022 08:45:07 -------------------------------------------------------------------------------- Wound Assessment Details Patient Name: Date of Service: [name], [name] 12/04/2022 8:30 A M Medical Record Number: [id number] Patient Account Number: [id number] LYLLIE, COBBINS ([id number]) [id number] Page 6 of 9 Date of Birth/Sex: Treating RN: 01-29-1971 (52 y.o. [name] Primary Care Sholanda Croson: Other Clinician: Julianne Handler Referring Jakaiya Netherland: Treating Jakobi Thetford/Extender: Koleen Nimrod Weeks in Treatment: 64 Wound Status Wound Number: 17 Primary Etiology: Sickle Cell Lesion Wound Location: Right, Lateral Lower Leg Wound Status: Open Wounding Event: Gradually Appeared Comorbid History: Anemia, Sickle Cell Disease, Neuropathy Date Acquired: 10/05/2012 Weeks Of Treatment: 85 Clustered Wound:  Yes Photos Wound Measurements Length: (cm) 8 Width: (cm) 7 Depth: (cm) 0.1 Area: (cm) 43.982 Volume: (cm) 4.398 % Reduction in Area: 76.6% % Reduction in Volume: 76.6% Epithelialization: Small (1-33%) Tunneling: No Undermining: No Wound Description Classification: Full Thickness Without Exposed Support Structures Wound Margin: Distinct, outline attached Exudate Amount: Medium Exudate Type: Serosanguineous Exudate Color: red, brown Foul Odor After Cleansing: No Slough/Fibrino Yes Wound Bed Granulation Amount: Small (1-33%) Exposed Structure Granulation Quality: Pink Fascia Exposed: No Necrotic Amount: Large (67-100%) Fat Layer (Subcutaneous Tissue) Exposed: Yes Necrotic Quality: Adherent Slough Tendon Exposed: No Muscle Exposed: No Joint Exposed: No Bone Exposed: No Periwound Skin Texture Texture Color No Abnormalities Noted: No No Abnormalities Noted: No Scarring: Yes Ecchymosis: No Hemosiderin Staining: Yes Moisture No Abnormalities Noted: Yes Temperature / Pain Temperature: No Abnormality Tenderness on Palpation: Yes Treatment Notes Wound #17 (Lower Leg) Wound Laterality: Right, Lateral Cleanser Soap and Water Discharge Instruction: May shower and wash wound with dial antibacterial soap and water prior to dressing change. Wound Cleanser Discharge Instruction: Cleanse the wound with wound cleanser prior to applying a clean dressing using gauze sponges, not tissue or cotton balls. Peri-Wound Care Triamcinolone 15 (g) Discharge Instruction: Use triamcinolone 15 (g) as directed [name] ([id number]) [id number] Page 7 of 9 Sween Lotion (Moisturizing lotion) Discharge Instruction: Apply moisturizing lotion as directed Topical Primary Dressing Santyl Ointment Discharge Instruction: Apply nickel thick amount to wound bed as instructed Secondary Dressing ABD Pad, 5x9 Discharge Instruction: Apply over primary dressing as  directed. Woven Gauze Sponge, Non-Sterile 4x4 in Discharge Instruction: Moisten with normal saline and Apply over primary dressing as directed. Secured With Elastic Bandage 4 inch (ACE bandage) Discharge Instruction: Secure with ACE bandage as directed. Kerlix Roll Sterile, 4.5x3.1 (in/yd) Discharge Instruction: Secure with Kerlix as directed. Compression Wrap Compression Stockings Add-Ons Electronic Signature(s) Signed: 12/04/2022 4:37:28 PM By: Samuella Bruin Entered By: Samuella Bruin on 12/04/2022 08:32:12 --------------------------------------------------------------------------------  Wound Assessment Details Patient Name: Date of Service: [name] [name] 12/04/2022 8:30 A M Medical Record Number: [id number] Patient Account Number: [id number] Date of Birth/Sex: Treating RN: 15-Mar-1971 (52 y.o. [name] Primary Care Zerline Melchior: Julianne Handler Other Clinician: Referring Shan Padgett: Treating Santino Kinsella/Extender: Koleen Nimrod Weeks in Treatment: 64 Wound Status Wound Number: 21 Primary Etiology: Sickle Cell Lesion Wound Location: Right, Medial Ankle Secondary Etiology: Venous Leg Ulcer Wounding Event: Gradually Appeared Wound Status: Open Date Acquired: 06/26/2021 Comorbid History: Anemia, Sickle Cell Disease, Neuropathy Weeks Of Treatment: 75 Clustered Wound: No Photos Wound Measurements Length: (cm) 8.5 Width: (cm) 3.8 Depth: (cm) 0.1 Fehnel, Diamonique ([id number]) Area: (cm) 25.368 Volume: (cm) 2.537 % Reduction in Area: 12.5% % Reduction in Volume: 12.5% Epithelialization: Small (1-33%) [id number] Page 8 of 9 Tunneling: No Undermining: No Wound Description Classification: Full Thickness Without Exposed Support Structures Wound Margin: Distinct, outline attached Exudate Amount: Medium Exudate Type: Serosanguineous Exudate Color: red, brown Foul Odor After Cleansing: No Slough/Fibrino Yes Wound  Bed Granulation Amount: Small (1-33%) Exposed Structure Granulation Quality: Pink Fascia Exposed: No Necrotic Amount: Large (67-100%) Fat Layer (Subcutaneous Tissue) Exposed: Yes Necrotic Quality: Adherent Slough Tendon Exposed: No Muscle Exposed: No Joint Exposed: No Bone Exposed: No Periwound Skin Texture Texture Color No Abnormalities Noted: No No Abnormalities Noted: Yes Scarring: Yes Temperature / Pain Temperature: No Abnormality Moisture No Abnormalities Noted: Yes Tenderness on Palpation: Yes Treatment Notes Wound #21 (Ankle) Wound Laterality: Right, Medial Cleanser Soap and Water Discharge Instruction: May shower and wash wound with dial antibacterial soap and water prior to dressing change. Wound Cleanser Discharge Instruction: Cleanse the wound with wound cleanser prior to applying a clean dressing using gauze sponges, not tissue or cotton balls. Peri-Wound Care Triamcinolone 15 (g) Discharge Instruction: Use triamcinolone 15 (g) as directed Sween Lotion (Moisturizing lotion) Discharge Instruction: Apply moisturizing lotion as directed Topical Primary Dressing Santyl Ointment Discharge Instruction: Apply nickel thick amount to wound bed as instructed Secondary Dressing ABD Pad, 5x9 Discharge Instruction: Apply over primary dressing as directed. Woven Gauze Sponge, Non-Sterile 4x4 in Discharge Instruction: Moisten with normal saline and Apply over primary dressing as directed. Secured With Elastic Bandage 4 inch (ACE bandage) Discharge Instruction: Secure with ACE bandage as directed. Kerlix Roll Sterile, 4.5x3.1 (in/yd) Discharge Instruction: Secure with Kerlix as directed. Compression Wrap Compression Stockings Add-Ons Electronic Signature(s) Signed: 12/04/2022 4:37:28 PM By: Samuella Bruin Entered By: Samuella Bruin on 12/04/2022 08:32:36 [name] ([id number]) [id number] Page 9 of  9 -------------------------------------------------------------------------------- Vitals Details Patient Name: Date of Service: Doneen Poisson, [place] [name] 12/04/2022 8:30 A M Medical Record Number: [id number] Patient Account Number: [id number] Date of Birth/Sex: Treating RN: May 05, 1971 (52 y.o. [name] Primary Care Shenee Wignall: Julianne Handler Other Clinician: Referring Cassidee Deats: Treating Hailie Searight/Extender: Koleen Nimrod Weeks in Treatment: 7 Vital Signs Time Taken: 08:24 Temperature (F): 98.1 Height (in): 67 Pulse (bpm): 75 Weight (lbs): 134 Respiratory Rate (breaths/min): 16 Body Mass Index (BMI): 21 Blood Pressure (mmHg): 121/70 Reference Range: 80 - 120 mg / dl Electronic Signature(s) Signed: 12/04/2022 4:37:28 PM By: Samuella Bruin Entered By: Samuella Bruin on 12/04/2022 08:25:20

## 2022-12-04 NOTE — Progress Notes (Signed)
[name] ([id number]) [id number] Page 1 of 17 Visit Report for 12/04/2022 Chief Complaint Document Details Patient Name: Date of Service: [name] 12/04/2022 8:30 A M Medical Record Number: [id number] Patient Account Number: [id number] Date of Birth/Sex: Treating RN: 1971/03/03 (52 y.o. F) Primary Care Provider: Julianne Handler Other Clinician: Referring Provider: Treating Provider/Extender: Koleen Nimrod Weeks in Treatment: 54 Information Obtained from: Patient Chief Complaint the patient is here for evaluation of her bilateral lower extremity sickle cell ulcers 04/17/2021; patient comes in for substantial wounds on the right and left lower leg Electronic Signature(s) Signed: 12/04/2022 8:52:02 AM By: Duanne Guess MD FACS Entered By: Duanne Guess on 12/04/2022 08:52:01 -------------------------------------------------------------------------------- Debridement Details Patient Name: Date of Service: [name] 12/04/2022 8:30 A M Medical Record Number: [id number] Patient Account Number: [id number] Date of Birth/Sex: Treating RN: 12-04-1970 (52 y.o. [name] Primary Care Provider: Julianne Handler Other Clinician: Referring Provider: Treating Provider/Extender: Koleen Nimrod Weeks in Treatment: 72 Debridement Performed for Assessment: Wound #17 Right,Lateral Lower Leg Performed By: Physician Duanne Guess, MD Debridement Type: Debridement Level of Consciousness (Pre-procedure): Awake and Alert Pre-procedure Verification/Time Out Yes - 08:48 Taken: Start Time: 08:48 Pain Control: Lidocaine 4% T opical Solution Percent of Wound Bed Debrided: 75% T Area Debrided (cm): otal 32.97 Tissue and other material debrided: Non-Viable, Slough, Slough Level: Non-Viable Tissue Debridement Description: Selective/Open Wound Instrument: Curette Bleeding: Minimum Hemostasis Achieved:  Pressure Response to Treatment: Procedure was tolerated well Level of Consciousness (Post- Awake and Alert procedure): Post Debridement Measurements of Total Wound Length: (cm) 8 Width: (cm) 7 Depth: (cm) 0.1 Volume: (cm) 4.398 Character of Wound/Ulcer Post Debridement: Improved Post Procedure Diagnosis [name] ([id number]) [id number] Page 2 of 17 Same as Pre-procedure Notes scribed for Dr. Lady Gary by Samuella Bruin, RN Electronic Signature(s) Signed: 12/04/2022 12:38:39 PM By: Duanne Guess MD FACS Signed: 12/04/2022 4:37:28 PM By: Samuella Bruin Entered By: Samuella Bruin on 12/04/2022 08:49:28 -------------------------------------------------------------------------------- Debridement Details Patient Name: Date of Service: [name] 12/04/2022 8:30 A M Medical Record Number: [id number] Patient Account Number: [id number] Date of Birth/Sex: Treating RN: August 22, 1970 (52 y.o. [name] Primary Care Provider: Julianne Handler Other Clinician: Referring Provider: Treating Provider/Extender: Koleen Nimrod Weeks in Treatment: 88 Debridement Performed for Assessment: Wound #21 Right,Medial Ankle Performed By: Physician Duanne Guess, MD Debridement Type: Debridement Severity of Tissue Pre Debridement: Fat layer exposed Level of Consciousness (Pre-procedure): Awake and Alert Pre-procedure Verification/Time Out Yes - 08:48 Taken: Start Time: 08:48 Pain Control: Lidocaine 4% T opical Solution Percent of Wound Bed Debrided: 70% T Area Debrided (cm): otal 17.75 Tissue and other material debrided: Non-Viable, Slough, Slough Level: Non-Viable Tissue Debridement Description: Selective/Open Wound Instrument: Curette Bleeding: Minimum Hemostasis Achieved: Pressure Response to Treatment: Procedure was tolerated well Level of Consciousness (Post- Awake and Alert procedure): Post Debridement  Measurements of Total Wound Length: (cm) 8.5 Width: (cm) 3.8 Depth: (cm) 0.1 Volume: (cm) 2.537 Character of Wound/Ulcer Post Debridement: Improved Severity of Tissue Post Debridement: Fat layer exposed Post Procedure Diagnosis Same as Pre-procedure Notes scribed for Dr. Lady Gary by Samuella Bruin, RN Electronic Signature(s) Signed: 12/04/2022 12:38:39 PM By: Duanne Guess MD FACS Signed: 12/04/2022 4:37:28 PM By: Samuella Bruin Entered By: Samuella Bruin on 12/04/2022 08:49:53 [name] ([id number]) [id number] Page 3 of 17 -------------------------------------------------------------------------------- HPI Details Patient Name: Date of Service: [name] 12/04/2022 8:30 A M Medical Record Number: [id number] Patient Account Number: [id number] Date of Birth/Sex: Treating  RN: 1971/02/11 (52 y.o. F) Primary Care Provider: Julianne Handler Other Clinician: Referring Provider: Treating Provider/Extender: Koleen Nimrod Weeks in Treatment: 53 History of Present Illness Location: medial and lateral ankle region on the right and left medial malleolus Quality: Patient reports experiencing a shooting pain to affected area(s). Severity: Patient states wound(s) are getting worse. Duration: right lower extremity bimalleolar ulcers have been present for approximately 2 years; the right medial malleolus ulcer has been there proximally 6 months Timing: Pain in wound is constant (hurts all the time) Context: The wound would happen gradually ssociated Signs and Symptoms: Patient reports having increase discharge. A HPI Description: 52 year old patient with a history of sickle cell anemia who was last seen by me with ulceration of the right lower extremity above the ankle and was referred to Dr. Marzetta Board for a surgical debridement as I was unable to do anything in the office due to excruciating pain. At that stage she  was referred from the plastic surgery service to dermatology who treated her for a skin infection with doxycycline and then Levaquin and a local antibiotic ointment. I understand the patient has since developed ulceration on the left ankle both medial and lateral and was now referred back to the wound center as dermatology has finished the management. I do not have any notes from the dermatology department Old notes: 52 year old patient with a history of sickle cell anemia, pain bilateral lower extremities, right lower extremity ulcer and has a history of receiving a skin graft( Theraskin) several months ago. She has been visiting the wound center [facility] and was seen by Dr. Leanord Hawking and Dr. Marzetta Board. after prolonged conservator therapy between July 2016 and January 2017. She had been seen by the plastic surgeon and taken to the OR for debridement and application of Theraskin. She had 3 applications of Theraskin and was then treated with collagen. Prior to that she had a history of similar problems in 2014 and was treated conservatively. Had a reflux study done for the right lower extremity in August 2016 without reflux or DVT . Past medical history significant for sickle cell disease, anemia, leg ulcers, cholelithiasis,and has never been a smoker. Once the patient was discharged on the wound center she says within 2 or 3 weeks the problems recurred and she has been treating it conservatively. since I saw her 3 weeks ago at [facility] she has been unable to get her dressing material but has completed a course of doxycycline. 6/7/ 2017 -- lower extremity venous duplex reflux evaluation was done No evidence of SVT or DVT in the RLL. No venous incompetence in the RLL. No further vascular workup is indicated at this time. She was seen by Dr. Mina Marble, on 10/04/2015. She agreed with the plan of taking her to the OR for debridement and application of theraskin and would also take biopsies to rule  out pyoderma gangrenosum. Follow-up note dated May 31 received and she was status post application of Theraskin to multiple ulcers around the right ankle. Pathology did not show evidence of malignancy or pyoderma gangrenosum. She would continue to see as in the wound clinic for further care and see Dr. Marzetta Board as needed. The patient brought the biopsy report and it was consistent with stasis ulcer no evidence of malignancy and the comment was that there was some adjacent neovascularization, fibrosis and patchy perivascular chronic inflammation. 11/15/2015 -- today we applied her first application of Theraskin 11/30/15; TheraSkin #2 12/13/2015 -- she is having a lot of  pain locally and is here for possible application of a theraskin today. 01/16/2016 -- the patient has significant pain and has noticed despite in spite of all local care and oral pain medication. It is impossible to debride her in the office. 02/06/2016 -- I do not see any notes from Dr. Leta Baptist( the patient has not made a call to the office know as she heard from them) and the only visit to recently was with her PCP Dr. Gypsy Decant -- I saw her on 01/16/2016 and prescribed 90 tablets of oxycodone 10 mg and did lab work and screening for HIV. the HIV was negative and hemoglobin was 6.3 with a WBC count of 14.9 and hematocrit of 17.8 with platelets of 561. 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 The bimalleolar ulcers to the right lower extremity have been present for approximately 2 years, the left medial malleolus ulcer has been present approximately 6 months. She has followed with Dr.Thimmappa in the past and has had a total of 3 applications of Theraskin (01/2015, 09/2015, 06/17/16). She has also followed with Dr. Meyer Russel here in the clinic and has received 2 applications of TheraSkin (11/10/15, 11/30/15). The patient  does experience chronic, and is not amenable to debridement. 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 She is not currently on any antibiotic therapy and has not been treated with any recently. 07/17/2016 -- was seen by Dr. Leta Baptist of plastic surgery who saw her 2 weeks postop application of Theraskin #3. She had removed her dressing and asked her to apply silver alginate on alternate days and follow-up back with the wound center. Future debridements and application of skin substitute would have to be done in the hospital due to her high risk for anesthesia. READMISSION 04/17/2021 Patient is now a 52 year old woman that we have had in this clinic for a prolonged period of time and 2016-2017 and then again for 2 visits in February 2018. At that point she had wounds on the right lower leg predominantly medial. She had also been seen by plastic surgery Dr. Marzetta Board who I believe took her to the OR for operative debridement and application of TheraSkin in 2017. After she left our clinic she was followed for a very prolonged period of time in the wound care center in [place] who then referred her ultimately to [facility] where she was seen by Dr. Mardene Speak. Again taken her to the OR for skin grafting which apparently did not take. She had multiple other attempts at dressings although I have not really looked over all of these notes in great detail. She has not been seen in a wound care center in about a year. She states over the last year in addition to her right lower leg she has developed wounds on the left lower leg quite extensive. She is using Xeroform to all of these wounds without really any improvement. She also has Medicaid which does not cover wound products. The patient has had vascular work-ups in the past including most recently on 03/28/2021 showing biphasic waveforms on the right triphasic at the PTA and biphasic at the dorsalis pedis on the left. She  was unable to tolerate any degree of compression to do ABIs. Unfortunately TBI's were also not done. She had venous reflux studies done in 2017. This did not show any evidence of a DVT or SVT  and no venous incompetence was noted in the right leg at the time this was the only side with the wound As noted I did not look all over her old records. She apparently had a course of HBO and Baptist although I am not sure what the indication would have been. In any case she developed seizures and terminated treatment earlier. She is generally much more disabled than when we last saw her in clinic. She can no longer walk pretty much wheelchair-bound because predominantly of pain in the left hip. 04/24/2021; the patient tolerated the wraps we put on. We used Santyl and Hydrofera Blue under compression. I brought her back for a nurse visit for a change in dressing. With Medicaid we will have a hard time getting anything paid for and hence the need for compression. She arrives in clinic with all the wounds looking somewhat better in terms of surface [name], [name] ([id number]) [id number] Page 4 of 17 12/20; circumferential wound on the right from the lateral to the medial. She has open areas on the left medial and left lateral x2 on all of this with the same surface. This does not look completely healthy although she does have some epithelialization. She is not complaining of a lot of pain which is unusual for her sickle ulcers. I have not looked over her extensive records from [facility]. She had recent arterial studies and has a history of venous reflux studies I will need to look these over although I do not believe she has significant arterial disease 2023 05/22/2021; patient's wound areas measure slightly smaller. Still a lot of drainage coming from the right we have been using Hydrofera Blue and Santyl with some improvement in the wound surfaces. She tells me she will be getting  transfused later in the week for her underlying sickle cell anemia I have looked over her recent arterial studies which were done in the fall. This was in November and showed biphasic and triphasic waveforms but she could not tolerate ABIs because of pressure and unfortunately TBI's were not done. She has not had recent venous reflux studies that I can see 1/10; not much change about the same surface area. This has a yellowish surface to it very gritty. We have been using Santyl and Hydrofera Blue for a prolonged period. Culture I did last week showed methicillin sensitive staph aureus "rare". Our intake nurse reports greenish drainage which may be the Hydrofera Blue itself 1/17; wounds are continue to measure smaller although I am not sure about the accuracy here. Especially the areas on the right are covered in what looks to be a nonviable surface although she does have some epithelialization. Similarly she has areas on the left medial and left lateral ankle area which appear to have a better surface and perhaps are slightly smaller. We have been using Santyl and Hydrofera Blue. She cannot tolerate mechanical debridements She went for her reflux studies which showed significant reflux at the greater saphenous vein at the saphenofemoral junction as well as the greater saphenous vein in the proximal calf on the left she had reflux in the thigh and the common femoral vein and supra vein Fishel vein reflux in the greater saphenous vein. I will have vein and vascular look at this. My thoughts have been that these are likely sickle wounds. I looked through her old records from [facility] wound care center and then when she graduated to [facility] wound care center where she saw Dr. Ronda Fairly and Dr.  Molnar. Although I can see she had reflux studies done I do not see that she actually saw a vein and vascular. I went over the fact that she had operative debridements and actual skin grafting that did not  take. I do not think these wounds have ever really progressed towards healing 1/31;Substantial wounds on the right ankle area. Hyper granulated very gritty adherent debris on the surface. 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 On the left she has no DVT or SVT deep vein reflux in the common femoral vein. Superficial vein reflux in the greater saphenous vein on although the vein diameter is not really all that large. I do not think there is anything that can be done with these although I am going to send her for consultation to vein and vascular. 2/7; Wound exam; substantial wound area on the right posterior ankle area and areas on the left medial ankle and left lateral ankle. I was able to debride the left medial ankle last week fairly aggressively and it is back this week to a completely nonviable surface She will see vascular surgery this Friday and I would like them to review the venous studies and also any comments on her arterial status. If they do not see an issue here I am going to refer her to plastic surgery for an operative debridement perhaps intraoperative ACell or Integra. Eventually she will require a deep tissue culture again 2/14; substantial wound area on the right posterior ankle, medial ankle. We have been using silver alginate The patient was seen by vein and vascular she had both venous reflux studies and arterial studies. 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 She had no evidence of superficial venous thrombosis. She did have superficial vein  reflux at the saphenofemoral junction and the greater saphenous vein. On the left no evidence of a DVT no evidence of superficial venous throat thrombosis she did have deep vein reflux in the common femoral vein and superficial vein reflux in the greater saphenous vein but these were not felt to be amenable to ablation. In terms of arterial studies she had triphasic and wife biphasic wave waveforms bilaterally not felt to have a significant arterial issue. I do not get the feeling that they felt that any part of her nonhealing wounds were related to either arterial or venous issues. They did note that she had venous reflux at the right at the [facility] and [facility]. And also on the left there were reflux in the deep system at the common femoral vein and greater saphenous vein in the proximal thigh. Nothing amenable to ablation. 2/20; she is making some decent progress on the right where there is nice skin between the 2 open areas on the right ankle. The surfaces here do not look viable yet there is some surrounding epithelialization. She still has a small area on the left medial ankle area. Hyper-granulated Jody's away always 2/28 patient has an appointment with plastic surgery on 3/8. We will see her back on 3/9. She may have to  call us to get the area redressed. We've been using Santyl under silver alginate. We made a nice improvement on the left medial ankle. The larger wounds on the right also looks somewhat better in terms of epithelialization although I think they could benefit from an aggressive debridement if plastic surgery would be willing to do that. Perhaps placement of Integra or a cell 07/26/2021: She saw Dr. Arita Miss yesterday. He raised the question as to whether or not this might be pyoderma and wanted to wait until that question was answered by dermatology before proceeding with any sort of operative debridement. We have continue to use Santyl under silver alginate with Kerlix and Coban  wraps. Overall, her wounds appear to be continuing to contract and epithelialize, with some granulation tissue present. There continues to be some slough on all wound surfaces. 08/09/2021: She has not been able to get an appointment with dermatology because apparently the offices in [facility] and [place] do not accept Medicaid. She is looking into whether or not she can be seen at the main [facility] dermatology clinic. This is necessary because plastic surgery is concerned that her wounds might represent pyoderma and they did not want to do any procedure until that was clarified. We have been using Santyl under silver alginate with Kerlix and Coban wraps. Today, there was a greater amount of drainage on her dressings with a slight green discoloration and significant odor. Despite this, her wounds continue to contract and epithelialize. There is pale granulation tissue present and actually, on the left medial ankle, the granulation tissue is a bit hypertrophic. 08/16/2021: Last week, I took a culture and this grew back rare methicillin-resistant Staph aureus and rare corynebacterium. The MRSA was sensitive to gentamicin which we began applying topically on an empiric basis. This week, her wounds are a bit smaller and the drainage and odor are less. Her primary care provider is working on assisting the patient with a dermatology evaluation. She has been in silver alginate over the gentamicin that was started last week along with Kerlix and Coban wraps. 08/23/2021: Because she has Medicaid, we have been unable to get her into see any dermatologist in the [place] to rule out pyoderma gangrenosum, which was a requirement from plastic surgery prior to any sort of debridement and grafting. Despite this, however, all of her wounds continue to get smaller. The wound on her left medial ankle is nearly closed. There is no odor from the wounds, although she still accumulates a modest amount of drainage on  her dressings. 08/30/2021: The lateral right ankle wound and the medial left ankle wound are a bit smaller today. The medial right ankle wound is about the same size. They are less tender. We have still been unable to get her into dermatology. 09/06/2021: All of the wounds are about the same size today. She continues to endorse minimal pain. I communicated with Dr. Arita Miss in plastic surgery regarding our issues getting a dermatology appointment; he was out of town but indicated that he would look into perhaps performing the biopsy in his office and will have his office contact her. [name], [name] ([id number]) [id number] Page 5 of 17 09/14/2021: The patient has an appointment in dermatology, but it is not until October. Her wounds are roughly the same; she continues to have very thick purulent-looking drainage on her dressings. 09/20/2021: The left medial wound is nearly closed and just has a bit of accumulated eschar on the surface. The right medial and lateral ankle  wounds are perhaps a little bit smaller. They continue to have a very pale surface with accumulation of thin slough. 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 I did not think Jodie Echevaria was indicated based on this. She is getting topical mupirocin with Prisma silver collagen. 10/04/2021: The patient was not seen in clinic last week due to childcare coverage issues. In the interim, the left medial leg wound has closed. The right sided leg wounds are smaller. There is more granulation tissue coming through, particularly on the lateral wound. The surface remains somewhat gritty. We have been applying topical mupirocin and Prisma silver collagen. 10/11/2021: The left medial leg wound remains closed. She does complain of some anesthetic sensation to the area. Both of the right-sided leg wounds are smaller but still have accumulated slough. 10/18/2021: Both right-sided leg wounds are minimally  smaller this week. She still continues to accumulate slough and has thick drainage on her dressings. 10/23/2021: Both wounds continue to contract. There is still slough buildup. She has been approved for a keratin-based skin substitute trial product but it will not be available until next week. 10/30/2021: The wounds are about the same to perhaps slightly smaller. There is still continued slough buildup. Unfortunately, the rep for the keratin based product did not show up today and did not answer his phone when called. 11/08/2021: The wounds are little bit smaller today. She continues to have thick drainage but the surfaces are relatively clean with just a little bit of slough accumulation. She reported to me today that she is unable to completely flex her left ankle and on examination it seems this is potentially related to scar tissue from her wounds. We do have the ProgenaMatrix trial product available for her today. 11/15/2021: Both wounds are smaller today. There is some slough accumulation on the surfaces, but the medial wound, in particular looks like it is filling in and is less deep. She did hear from physical therapy and she is going to start working with them on July 11. She is here for her second application of the trial skin substitute, ProgenaMatrix. 11/22/2021: Both wounds continue to contract, the medial more dramatically than the lateral. Both wounds have a layer of slough on the surface, but underneath this, the gritty fibrous tissue has a little bit more of a pink cast to it rather than being as pale as it has been. 11/29/2021: The wounds are roughly the same size this week, perhaps a millimeter or 2 smaller. The medial wound has filled in and is nearly flush with the surrounding skin surface. She continues to have a lot of slough accumulation on both surfaces. 12/06/2021: No significant change to her wounds, but she has a new opening on her dorsal foot, just distal to the right lateral  ankle wound. The area on her left medial ankle that reopened looks a little bit larger today. She has quite a bit of pain associated with the new wound. 12/12/2021: Her wounds look about the same but the new opening on her right lateral dorsal foot is a little bit bigger. She continues to have a fair amount of pain with this wound. 12/26/2021: The left medial ankle wound is tiny and superficial. She has 2 areas of crusting on her left lateral ankle, however, that appear to be threatening to open again. Her right medial ankle wound is a little bit smaller today but still continues to accumulate thick rubbery slough. The new dorsal foot wound is exquisitely  painful but there is no odor or purulent drainage. No erythema or induration. The right lateral ankle wound looks about the same today, again with thick rubbery slough. 01/03/2022: The left medial ankle wound has closed again. Both right ankle wounds appear to be about the same size with thick rubbery slough. The dorsal foot wound on the right continues to be quite painful and she stated that she did not want any debridement of that site today. 01/10/2022: No real change to any of her wounds. She continues to accumulate thick slough. The dorsal foot wound has merged with the lateral malleolar wound. She is experiencing significant pain in the dorsal foot portion of the ulcer. 01/16/2022: Absolutely no change or progress in her wounds. 01/24/2022: Her wounds are unchanged. She continues to build up slough and the wounds on her dorsal right foot are still exquisitely tender. 01/31/2022: The wounds actually measure a little bit narrower today. They still have thick slough on the surface but the underlying tissue seems a little less fibrotic. We changed to Iodosorb last week. 02/07/2022: Wounds continue to slowly epithelialize around the parameters. She has less pain today. She still accumulates a fairly substantial layer of slough. 02/15/2022: No change to the  wounds overall. The measured a little bit larger today per the intake nurse. She continues to have substantial slough accumulation and her pain is a little bit worse. 02/21/2022. No change at all to her wounds. I do not see that plastic surgery has received her referral yet. 02/28/2022: The wounds remain unchanged. They are dry and fibrotic with accumulation of slough and eschar. She did receive an appointment to see plastic surgery on October 23, but the office called her back and indicated they needed to reschedule and that the next available appointment was not until December. The patient became angry and decided she did not want to see this plastics group. 10/19; the patient sees Dr. Maylene Roes again next week. She is using Medihoney as the primary dressing changing this herself. 03/21/2022: She saw Dr. Ferd Hibbs at the wound care center at [facility]. Dr. Ferd Hibbs was also unable to debride her, secondary to pain and is planning to take her to the operating room for operative debridement and potential skin substitute placement. Her wounds are unchanged with thick yellow slough and a fibrotic base. She says they are too painful to be debrided. In addition, yesterday her hemoglobin was 4 and she received 2 units packed red blood cell transfusion. 04/04/2022: Her operation at [facility] is scheduled for December 15. She continues to use Medihoney on her wounds. They are a little bit less painful today and she is willing to entertain the possibility of debridement. The wounds measured a little bit larger in all dimensions today but the layer of slough is not as thick as usual. 04/18/2022: Her wounds actually measured a little bit smaller today and the extension onto the dorsal foot from the lateral wound has healed. They are less tender and there is significantly less slough present as compared to prior visits. 05/09/2022: She had to reschedule her surgery due to lack of childcare.  It is now planned for January 5. The wounds are basically unchanged, but she had a lot more drainage, which was thick and somewhat purulent in appearance. No significant odor. 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 They are quite painful today and she does not want to have debridement performed. 05/30/2022: The patient underwent surgical debridement and placement of TheraSkin  to her wounds on January 5. She has been doing well and does not endorse any pain. The TheraSkin is intact and looks beautiful. It is sutured in place. There is no exudate or significant drainage. [name], [name] ([id number]) [id number] Page 6 of 17 06/04/2022: Her TheraSkin remains intact and I am starting to see granulation tissue emerging underneath the surface. No concern for infection. 06/12/2022: The TheraSkin is intact and there is more granulation tissue emerging. It is starting to look a little bit dry, however. No malodor or purulent drainage. 06/19/2022: The TheraSkin continues to look good. The edges are a bit dry, but the central portion of each of her wounds is showing a nice pink color. 06/27/2022: The TheraSkin on the lateral wound remains almost completely intact. There is nice pink tissue peaking through the mesh of the TheraSkin. On the medial leg, it is starting to breakdown a little bit, but most of the TheraSkin remains intact here, as well. Both wounds measured smaller today. 07/03/2022: The wounds are fairly dry around the perimeter today. Some of the suture is hanging loose. 07/12/2022: Both wounds are measuring smaller today. The medial ulcer is getting a bit too dry. The lateral ulcer continues to have nice buds of granulation tissue emerging. 07/26/2022: The moisture balance on both wounds is much better today. For the first time since I began seeing her, I see actual beefy red granulation tissue on the lateral ankle wound. There are more buds of deep pink granulation  tissue emerging on the medial ankle. There is some dry eschar around the edges of the medial wound and a tiny amount of slough overlying the good granulation tissue on the lateral wound. 08/09/2022: Unfortunately, she has a new wound on her left lateral lower leg, just above the ankle. It is extremely painful for her. It penetrates to the fat layer and has the same woody, fibrotic character as her previous wounds. The other wounds are both a little bit smaller with slough and some eschar around the periphery. 08/14/2022: Her right lateral ankle wound is a little bit smaller. Unfortunately, she has had an increase in her drainage and it has a purulent nature to it, similar to what was present prior to her surgical debridement. She also reports odor coming from her wounds this week. The left lateral leg wound remains extremely tender. 08/21/2022: The left lateral ankle wound is larger and more painful today. Both right ankle wounds have a little bit more vital appearance, but they have accumulated more slough. She reports less odor this week. She is currently taking Augmentin. 08/28/2022: The left lateral ankle wound continues to enlarge and remains quite painful. Both of the right ankle wounds actually look better this week. There is improved tissue quality with less slough accumulation. 09/27/2022: The left lateral ankle wound actually looks nearly healed. She says that she has been applying some silver alginate that she had leftover. She was unable to afford the Santyl that was prescribed, but found a tube that she had on hand at home and has been using this on her right ankle wounds. These wounds look about the same and have accumulated the usual layer of slough. Unfortunately, a portion of the dorsal foot wound has reopened and is exquisitely painful today. 10/09/2022: The left lateral ankle wound is healed. The dorsal foot wound appears to have expanded and remains exquisitely painful. The medial and  lateral right ankle wounds look about the same size, but they are quite a bit cleaner. 10/23/2022:  The left lateral ankle remains closed. The dorsal foot wound has expanded to the point that it now converges with the right lateral leg wound. She has accumulated fairly thick slough on all of the open surfaces. 11/06/2022: No significant change to any of her wounds, although the dorsal foot aspect of the right lateral wound is a little bit smaller. She has been approved for the [employer] and [employer] for [employer] and will be able to get a full year's supply. 11/20/2022: The dorsal foot aspect of the right lateral wound has almost completely closed. I am seeing nice pink tissue starting to emerge from the fibrotic surface of both wounds. The medial leg wound appears to be a little bit smaller today. She has been using Santyl. 12/04/2022: The dorsal foot aspect of the right lateral wound has closed. There is more pink granulation tissue emerging her wounds. There are buds of epithelium beginning to fill in from the edges of her wound, particularly at the proximal aspect of the medial wound and the distal aspect of the lateral wound. Significantly less slough formation than in the past. Electronic Signature(s) Signed: 12/04/2022 8:53:35 AM By: Duanne Guess MD FACS Entered By: Duanne Guess on 12/04/2022 08:53:35 -------------------------------------------------------------------------------- Physical Exam Details Patient Name: Date of Service: [name] 12/04/2022 8:30 A M Medical Record Number: [id number] Patient Account Number: [id number] Date of Birth/Sex: Treating RN: 02/03/1971 (52 y.o. F) Primary Care Provider: Julianne Handler Other Clinician: Referring Provider: Treating Provider/Extender: Koleen Nimrod Weeks in Treatment: 71 Constitutional . . . . no acute distress. Respiratory Normal work of breathing on room air. Notes 12/04/2022: The dorsal foot  aspect of the right lateral wound has closed. There is more pink granulation tissue emerging her wounds. There are buds of [name], [name] ([id number]) [id number] Page 7 of 17 epithelium beginning to fill in from the edges of her wound, particularly at the proximal aspect of the medial wound and the distal aspect of the lateral wound. Significantly less slough formation than in the past. Electronic Signature(s) Signed: 12/04/2022 8:54:33 AM By: Duanne Guess MD FACS Entered By: Duanne Guess on 12/04/2022 08:54:33 -------------------------------------------------------------------------------- Physician Orders Details Patient Name: Date of Service: [name] 12/04/2022 8:30 A M Medical Record Number: [id number] Patient Account Number: [id number] Date of Birth/Sex: Treating RN: 05-Feb-1971 (52 y.o. 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 - Dr. Lady Gary Rm 2 Return A Anesthetic (In clinic) Topical Lidocaine 5% applied to wound bed (In clinic) Topical Lidocaine 4% applied to wound bed Bathing/ Shower/ Hygiene May shower and wash wound with soap and water. Edema Control - Lymphedema / SCD / Other Elevate legs to the level of the heart or above for 30 minutes daily and/or when sitting for 3-4 times a day throughout the day. Avoid standing for long periods of time. Exercise regularly Additional Orders / Instructions Follow Nutritious Diet Juven Shake 1-2 times daily. Wound Treatment Wound #17 - Lower Leg Wound Laterality: Right, Lateral Cleanser: Soap  and Water 1 x Per Week/30 Days Discharge Instructions: May shower and wash wound with dial antibacterial soap and water prior to dressing change. Cleanser: Wound Cleanser 1 x Per Week/30 Days  Discharge Instructions: Cleanse the wound with wound cleanser prior to applying a clean dressing using gauze sponges, not tissue or cotton balls. Peri-Wound Care: Triamcinolone 15 (g) 1 x Per Week/30 Days Discharge Instructions: Use triamcinolone 15 (g) as directed Peri-Wound Care: Sween Lotion (Moisturizing lotion) 1 x Per Week/30 Days Discharge Instructions: Apply moisturizing lotion as directed Prim Dressing: Santyl Ointment 1 x Per Week/30 Days ary Discharge Instructions: Apply nickel thick amount to wound bed as instructed Secondary Dressing: ABD Pad, 5x9 1 x Per Week/30 Days Discharge Instructions: Apply over primary dressing as directed. Secondary Dressing: Woven Gauze Sponge, Non-Sterile 4x4 in 1 x Per Week/30 Days Discharge Instructions: Moisten with normal saline and Apply over primary dressing as directed. Secured With: Elastic Bandage 4 inch (ACE bandage) 1 x Per Week/30 Days Discharge Instructions: Secure with ACE bandage as directed. CHASE, KNEBEL ([id number]) [id number] Page 8 of 17 Secured With: [employer], 4.5x3.1 (in/yd) 1 x Per Week/30 Days Discharge Instructions: Secure with Kerlix as directed. Wound #21 - Ankle Wound Laterality: Right, Medial Cleanser: Soap and Water 1 x Per Week/30 Days Discharge Instructions: May shower and wash wound with dial antibacterial soap and water prior to dressing change. Cleanser: Wound Cleanser 1 x Per Week/30 Days Discharge Instructions: Cleanse the wound with wound cleanser prior to applying a clean dressing using gauze sponges, not tissue or cotton balls. Peri-Wound Care: Triamcinolone 15 (g) 1 x Per Week/30 Days Discharge Instructions: Use triamcinolone 15 (g) as directed Peri-Wound Care: Sween Lotion  (Moisturizing lotion) 1 x Per Week/30 Days Discharge Instructions: Apply moisturizing lotion as directed Prim Dressing: Santyl Ointment 1 x Per Week/30 Days ary Discharge Instructions: Apply nickel thick amount to wound bed as instructed Secondary Dressing: ABD Pad, 5x9 1 x Per Week/30 Days Discharge Instructions: Apply over primary dressing as directed. Secondary Dressing: Woven Gauze Sponge, Non-Sterile 4x4 in 1 x Per Week/30 Days Discharge Instructions: Moisten with normal saline and Apply over primary dressing as directed. Secured With: Elastic Bandage 4 inch (ACE bandage) 1 x Per Week/30 Days Discharge Instructions: Secure with ACE bandage as directed. Secured With: [employer], 4.5x3.1 (in/yd) 1 x Per Week/30 Days Discharge Instructions: Secure with Kerlix as directed. Patient Medications llergies: No Known Allergies A Notifications Medication Indication Start End 12/04/2022 lidocaine DOSE topical 4 % cream - cream topical Electronic Signature(s) Signed: 12/04/2022 12:38:39 PM By: Duanne Guess MD FACS Entered By: Duanne Guess on 12/04/2022 08:54:47 -------------------------------------------------------------------------------- Problem List Details Patient Name: Date of Service: [name] 12/04/2022 8:30 A M Medical Record Number: [id number] Patient Account Number: [id number] Date of Birth/Sex: Treating RN: 10-08-1970 (52 y.o. F) Primary Care Provider: Julianne Handler Other Clinician: Referring Provider: Treating Provider/Extender: Koleen Nimrod Weeks in Treatment: 66 Active Problems ICD-10 Encounter Code Description Active Date MDM Diagnosis L97.818 Non-pressure chronic ulcer of other part of right lower leg with other specified 04/17/2021 No Yes severity 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 04/17/2021 No Yes [name], [name] ([id number]) [id number] Page 9 of 17 Inactive Problems ICD-10 Code  Description Active Date Inactive Date L97.828 Non-pressure chronic ulcer of other part of left lower leg with other specified severity 04/17/2021 04/17/2021 Resolved Problems ICD-10 Code Description Active Date Resolved Date L97.822 Non-pressure chronic ulcer of other part of left lower leg with fat layer exposed 08/09/2022 08/09/2022 Electronic Signature(s) Signed: 12/04/2022 8:51:44 AM By: Duanne Guess MD FACS Entered By: Duanne Guess on 12/04/2022 08:51:44 -------------------------------------------------------------------------------- Progress Note Details Patient Name: Date of Service: [name] URO UMA, FA [name]  12/04/2022 8:30 A M Medical Record Number: [id number] Patient Account Number: [id number] Date of Birth/Sex: Treating RN: 07-29-1970 (52 y.o. F) Primary Care Provider: Julianne Handler Other Clinician: Referring Provider: Treating Provider/Extender: Koleen Nimrod Weeks in Treatment: 1 Subjective Chief Complaint Information obtained from Patient the patient is here for evaluation of her bilateral lower extremity sickle cell ulcers 04/17/2021; patient comes in for substantial wounds on the right and left lower leg History of Present Illness (HPI) The following HPI elements were documented for the patient's wound: Location: medial and lateral ankle region on the right and left medial malleolus Quality: Patient reports experiencing a shooting pain to affected area(s). Severity: Patient states wound(s) are getting worse. Duration: right lower extremity bimalleolar ulcers have been present for approximately 2 years; the right medial malleolus ulcer has been there proximally 6 months Timing: Pain in wound is constant (hurts all the time) Context: The wound would happen gradually Associated Signs and Symptoms: Patient reports having increase discharge. 52 year old patient with a history of sickle cell anemia who was last seen by me with ulceration of the right  lower extremity above the ankle and was referred to Dr. Marzetta Board for a surgical debridement as I was unable to do anything in the office due to excruciating pain. At that stage she was referred from the plastic surgery service to dermatology who treated her for a skin infection with doxycycline and then Levaquin and a local antibiotic ointment. I understand the patient has since developed ulceration on the left ankle both medial and lateral and was now referred back to the wound center as dermatology has finished the management. I do not have any notes from the dermatology department Old notes: 52 year old patient with a history of sickle cell anemia, pain bilateral lower extremities, right lower extremity ulcer and has a history of receiving a skin graft( Theraskin) several months ago. She has been visiting the wound center [facility] and was seen by Dr. Leanord Hawking and Dr. Marzetta Board. after prolonged conservator therapy between July 2016 and January 2017. She had been seen by the plastic surgeon and taken to the OR for debridement and application of Theraskin. She had 3 applications of Theraskin and was then treated with collagen. Prior to that she had a history of similar problems in 2014 and was treated conservatively. Had a reflux study done for the right lower extremity in August 2016 without reflux or DVT . Past medical history significant for sickle cell disease, anemia, leg ulcers, cholelithiasis,and has never been a smoker. Once the patient was discharged on the wound center she says within 2 or 3 weeks the problems recurred and she has been treating it conservatively. since I saw her 3 weeks ago at [facility] she has been unable to get her dressing material but has completed a course of doxycycline. 6/7/ 2017 -- lower extremity venous duplex reflux evaluation was done  No evidence of SVT or DVT in the RLL. No venous incompetence in the RLL. No further vascular workup is indicated at this  time. She was seen by Dr. Mina Marble, on 10/04/2015. She agreed with the plan of taking her to the OR for debridement and application of theraskin and would also take biopsies to rule out pyoderma gangrenosum. DAVISHA, LINTHICUM ([id number]) [id number] Page 10 of 17 Follow-up note dated May 31 received and she was status post application of Theraskin to multiple ulcers around the right ankle. Pathology did not show evidence of malignancy or pyoderma gangrenosum. She would continue to see  as in the wound clinic for further care and see Dr. Marzetta Board as needed. The patient brought the biopsy report and it was consistent with stasis ulcer no evidence of malignancy and the comment was that there was some adjacent neovascularization, fibrosis and patchy perivascular chronic inflammation. 11/15/2015 -- today we applied her first application of Theraskin 11/30/15; TheraSkin #2 12/13/2015 -- she is having a lot of pain locally and is here for possible application of a theraskin today. 01/16/2016 -- the patient has significant pain and has noticed despite in spite of all local care and oral pain medication. It is impossible to debride her in the office. 02/06/2016 -- I do not see any notes from Dr. Leta Baptist( the patient has not made a call to the office know as she heard from them) and the only visit to recently was with her PCP Dr. Gypsy Decant -- I saw her on 01/16/2016 and prescribed 90 tablets of oxycodone 10 mg and did lab work and screening for HIV. the HIV was negative and hemoglobin was 6.3 with a WBC count of 14.9 and hematocrit of 17.8 with platelets of 561. 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 The bimalleolar ulcers to the right lower extremity have been present for approximately 2 years, the left medial malleolus ulcer has been present  approximately 6 months. She has followed with Dr.Thimmappa in the past and has had a total of 3 applications of Theraskin (01/2015, 09/2015, 06/17/16). She has also followed with Dr. Meyer Russel here in the clinic and has received 2 applications of TheraSkin (11/10/15, 11/30/15). The patient does experience chronic, and is not amenable to debridement. 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 She is not currently on any antibiotic therapy and has not been treated with any recently. 07/17/2016 -- was seen by Dr. Leta Baptist of plastic surgery who saw her 2 weeks postop application of Theraskin #3. She had removed her dressing and asked her to apply silver alginate on alternate days and follow-up back with the wound center. Future debridements and application of skin substitute would have to be done in the hospital due to her high risk for anesthesia. READMISSION 04/17/2021 Patient is now a 52 year old woman that we have had in this clinic for a prolonged period of time and 2016-2017 and then again for 2 visits in February 2018. At that point she had wounds on the right lower leg predominantly medial. She had also been seen by plastic surgery Dr. Marzetta Board who I believe took her to the OR for operative debridement and application of TheraSkin in 2017. After she left our clinic she was followed for a very prolonged period of time in the wound care center in [facility] who then referred her ultimately to [facility] where she was seen by Dr. Mardene Speak. Again taken her to the OR for skin grafting which apparently did not take. She had multiple other attempts at dressings although I have not really looked over all of these notes in great detail. She has not been seen in a wound care center in about a year. She states over the last year in addition to her right lower leg she has developed wounds on the left lower leg quite extensive. She is using Xeroform to all of these wounds without  really any improvement. She also has Medicaid which does not cover wound products. The patient  has had vascular work-ups in the past including most recently on 03/28/2021 showing biphasic waveforms on the right triphasic at the PTA and biphasic at the dorsalis pedis on the left. She was unable to tolerate any degree of compression to do ABIs. Unfortunately TBI's were also not done. She had venous reflux studies done in 2017. This did not show any evidence of a DVT or SVT and no venous incompetence was noted in the right leg at the time this was the only side with the wound As noted I did not look all over her old records. She apparently had a course of HBO and [facility] although I am not sure what the indication would have been. In any case she developed seizures and terminated treatment earlier. She is generally much more disabled than when we last saw her in clinic. She can no longer walk pretty much wheelchair-bound because predominantly of pain in the left hip. 04/24/2021; the patient tolerated the wraps we put on. We used Santyl and Hydrofera Blue under compression. I brought her back for a nurse visit for a change in dressing. With Medicaid we will have a hard time getting anything paid for and hence the need for compression. She arrives in clinic with all the wounds looking somewhat better in terms of surface 12/20; circumferential wound on the right from the lateral to the medial. She has open areas on the left medial and left lateral x2 on all of this with the same surface. This does not look completely healthy although she does have some epithelialization. She is not complaining of a lot of pain which is unusual for her sickle ulcers. I have not looked over her extensive records from [facility]. She had recent arterial studies and has a history of venous reflux studies I will need to look these over although I do not believe she has significant arterial disease 2023 05/22/2021; patient's wound  areas measure slightly smaller. Still a lot of drainage coming from the right we have been using Hydrofera Blue and Santyl with some improvement in the wound surfaces. She tells me she will be getting transfused later in the week for her underlying sickle cell anemia I have looked over her recent arterial studies which were done in the fall. This was in November and showed biphasic and triphasic waveforms but she could not tolerate ABIs because of pressure and unfortunately TBI's were not done. She has not had recent venous reflux studies that I can see 1/10; not much change about the same surface area. This has a yellowish surface to it very gritty. We have been using Santyl and Hydrofera Blue for a prolonged period. Culture I did last week showed methicillin sensitive staph aureus "rare". Our intake nurse reports greenish drainage which may be the Hydrofera Blue itself 1/17; wounds are continue to measure smaller although I am not sure about the accuracy here. Especially the areas on the right are covered in what looks to be a nonviable surface although she does have some epithelialization. Similarly she has areas on the left medial and left lateral ankle area which appear to have a better surface and perhaps are slightly smaller. We have been using Santyl and Hydrofera Blue. She cannot tolerate mechanical debridements She went for her reflux studies which showed significant reflux at the greater saphenous vein at the saphenofemoral junction as well as the greater saphenous vein in the proximal calf on the left she had reflux in the thigh and the common femoral vein and  supra vein Fishel vein reflux in the greater saphenous vein. I will have vein and vascular look at this. My thoughts have been that these are likely sickle wounds. I looked through her old records from [facility] wound care center and then when she graduated to [facility] wound care center where she saw Dr. Ronda Fairly and Dr.  Mardene Speak. Although I can see she had reflux studies done I do not see that she actually saw a vein and vascular. I went over the fact that she had operative debridements and actual skin grafting that did not take. I do not think these wounds have ever really progressed towards healing 1/31;Substantial wounds on the right ankle area. Hyper granulated very gritty adherent debris on the surface. 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 On the left she has no DVT or SVT deep vein reflux in the common femoral vein. Superficial vein reflux in the greater saphenous vein on although the vein diameter is not really all that large. I do not think there is anything that can be done with these although I am going to send her for consultation to vein and vascular. 2/7; Wound exam; substantial wound area on the right posterior ankle area and areas on the left medial ankle and left lateral ankle. I was able to debride the left medial ankle last week fairly aggressively and it is back this week to a completely nonviable surface She will see vascular surgery this Friday and I would like them to review the venous studies and also any comments on her arterial status. If they do not see an issue here I am going to refer her to plastic surgery for an operative debridement perhaps intraoperative ACell or Integra. Eventually she will require a deep [name], [name] ([id number]) [id number] Page 11 of 17 tissue culture again 2/14; substantial wound area on the right posterior ankle, medial ankle. We have been using silver alginate The patient  was seen by vein and vascular she had both venous reflux studies and arterial studies. 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 She had no evidence of superficial venous thrombosis. She did have superficial vein reflux at the saphenofemoral junction and the greater saphenous vein. On the left no evidence of a DVT no evidence of superficial venous throat thrombosis she did have deep vein reflux in the common femoral vein and superficial vein reflux in the greater saphenous vein but these were not felt to be amenable to ablation. In terms of arterial studies she had triphasic and wife biphasic wave waveforms bilaterally not felt to have a significant arterial issue. I do not get the feeling that they felt that any part of her nonhealing wounds were related to either arterial or venous issues. They did note that she had venous reflux at the right at the [facility] and GCV. And also on the left there were reflux in the deep system at the common femoral vein and greater saphenous vein in the proximal thigh. Nothing amenable to ablation. 2/20; she is making some decent  progress on the right where there is nice skin between the 2 open areas on the right ankle. The surfaces here do not look viable yet there is some surrounding epithelialization. She still has a small area on the left medial ankle area. Hyper-granulated Jody's away always 2/28 patient has an appointment with plastic surgery on 3/8. We will see her back on 3/9. She may have to call us to get the area redressed. We've been using Santyl under silver alginate. We made a nice improvement on the left medial ankle. The larger wounds on the right also looks somewhat better in terms of epithelialization although I think they could benefit from an aggressive debridement if plastic surgery would be willing to do that. Perhaps placement of Integra or a cell 07/26/2021: She saw Dr. Arita Miss  yesterday. He raised the question as to whether or not this might be pyoderma and wanted to wait until that question was answered by dermatology before proceeding with any sort of operative debridement. We have continue to use Santyl under silver alginate with Kerlix and Coban wraps. Overall, her wounds appear to be continuing to contract and epithelialize, with some granulation tissue present. There continues to be some slough on all wound surfaces. 08/09/2021: She has not been able to get an appointment with dermatology because apparently the offices in [facility] and [place] do not accept Medicaid. She is looking into whether or not she can be seen at the main [facility] dermatology clinic. This is necessary because plastic surgery is concerned that her wounds might represent pyoderma and they did not want to do any procedure until that was clarified. We have been using Santyl under silver alginate with Kerlix and Coban wraps. Today, there was a greater amount of drainage on her dressings with a slight green discoloration and significant odor. Despite this, her wounds continue to contract and epithelialize. There is pale granulation tissue present and actually, on the left medial ankle, the granulation tissue is a bit hypertrophic. 08/16/2021: Last week, I took a culture and this grew back rare methicillin-resistant Staph aureus and rare corynebacterium. The MRSA was sensitive to gentamicin which we began applying topically on an empiric basis. This week, her wounds are a bit smaller and the drainage and odor are less. Her primary care provider is working on assisting the patient with a dermatology evaluation. She has been in silver alginate over the gentamicin that was started last week along with Kerlix and Coban wraps. 08/23/2021: Because she has Medicaid, we have been unable to get her into see any dermatologist in the [place] to rule out pyoderma gangrenosum, which was a requirement from  plastic surgery prior to any sort of debridement and grafting. Despite this, however, all of her wounds continue to get smaller. The wound on her left medial ankle is nearly closed. There is no odor from the wounds, although she still accumulates a modest amount of drainage on her dressings. 08/30/2021: The lateral right ankle wound and the medial left ankle wound are a bit smaller today. The medial right ankle wound is about the same size. They are less tender. We have still been unable to get her into dermatology. 09/06/2021: All of the wounds are about the same size today. She continues to endorse minimal pain. I communicated with Dr. Arita Miss in plastic surgery regarding our issues getting a dermatology appointment; he was out of town but indicated that he would look into perhaps performing the biopsy in his office and will have  his office contact her. 09/14/2021: The patient has an appointment in dermatology, but it is not until October. Her wounds are roughly the same; she continues to have very thick purulent-looking drainage on her dressings. 09/20/2021: The left medial wound is nearly closed and just has a bit of accumulated eschar on the surface. The right medial and lateral ankle wounds are perhaps a little bit smaller. They continue to have a very pale surface with accumulation of thin slough. 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 I did not think Jodie Echevaria was indicated based on this. She is getting topical mupirocin with Prisma silver collagen. 10/04/2021: The patient was not seen in clinic last week due to childcare coverage issues. In the interim, the left medial leg wound has closed. The right sided leg wounds are smaller. There is more granulation tissue coming through, particularly on the lateral wound. The surface remains somewhat gritty. We have been applying topical mupirocin and Prisma silver collagen. 10/11/2021: The left medial leg wound remains closed. She  does complain of some anesthetic sensation to the area. Both of the right-sided leg wounds are smaller but still have accumulated slough. 10/18/2021: Both right-sided leg wounds are minimally smaller this week. She still continues to accumulate slough and has thick drainage on her dressings. 10/23/2021: Both wounds continue to contract. There is still slough buildup. She has been approved for a keratin-based skin substitute trial product but it will not be available until next week. 10/30/2021: The wounds are about the same to perhaps slightly smaller. There is still continued slough buildup. Unfortunately, the rep for the keratin based product did not show up today and did not answer his phone when called. 11/08/2021: The wounds are little bit smaller today. She continues to have thick drainage but the surfaces are relatively clean with just a little bit of slough accumulation. She reported to me today that she is unable to completely flex her left ankle and on examination it seems this is potentially related to scar tissue from her wounds. We do have the ProgenaMatrix trial product available for her today. 11/15/2021: Both wounds are smaller today. There is some slough accumulation on the surfaces, but the medial wound, in particular looks like it is filling in and is less deep. She did hear from physical therapy and she is going to start working with them on July 11. She is here for her second application of the trial skin substitute, ProgenaMatrix. 11/22/2021: Both wounds continue to contract, the medial more dramatically than the lateral. Both wounds have a layer of slough on the surface, but underneath this, the gritty fibrous tissue has a little bit more of a pink cast to it rather than being as pale as it has been. 11/29/2021: The wounds are roughly the same size this week, perhaps a millimeter or 2 smaller. The medial wound has filled in and is nearly flush with the surrounding skin surface. She  continues to have a lot of slough accumulation on both surfaces. 12/06/2021: No significant change to her wounds, but she has a new opening on her dorsal foot, just distal to the right lateral ankle wound. The area on her left medial ankle that reopened looks a little bit larger today. She has quite a bit of pain associated with the new wound. 12/12/2021: Her wounds look about the same but the new opening on her right lateral dorsal foot is a little bit bigger. She continues to have a fair amount of  pain with this wound. 12/26/2021: The left medial ankle wound is tiny and superficial. She has 2 areas of crusting on her left lateral ankle, however, that appear to be threatening to [name], [name] ([id number]) [id number] Page 12 of 17 open again. Her right medial ankle wound is a little bit smaller today but still continues to accumulate thick rubbery slough. The new dorsal foot wound is exquisitely painful but there is no odor or purulent drainage. No erythema or induration. The right lateral ankle wound looks about the same today, again with thick rubbery slough. 01/03/2022: The left medial ankle wound has closed again. Both right ankle wounds appear to be about the same size with thick rubbery slough. The dorsal foot wound on the right continues to be quite painful and she stated that she did not want any debridement of that site today. 01/10/2022: No real change to any of her wounds. She continues to accumulate thick slough. The dorsal foot wound has merged with the lateral malleolar wound. She is experiencing significant pain in the dorsal foot portion of the ulcer. 01/16/2022: Absolutely no change or progress in her wounds. 01/24/2022: Her wounds are unchanged. She continues to build up slough and the wounds on her dorsal right foot are still exquisitely tender. 01/31/2022: The wounds actually measure a little bit narrower today. They still have thick slough on the surface but  the underlying tissue seems a little less fibrotic. We changed to Iodosorb last week. 02/07/2022: Wounds continue to slowly epithelialize around the parameters. She has less pain today. She still accumulates a fairly substantial layer of slough. 02/15/2022: No change to the wounds overall. The measured a little bit larger today per the intake nurse. She continues to have substantial slough accumulation and her pain is a little bit worse. 02/21/2022. No change at all to her wounds. I do not see that plastic surgery has received her referral yet. 02/28/2022: The wounds remain unchanged. They are dry and fibrotic with accumulation of slough and eschar. She did receive an appointment to see plastic surgery on October 23, but the office called her back and indicated they needed to reschedule and that the next available appointment was not until December. The patient became angry and decided she did not want to see this plastics group. 10/19; the patient sees Dr. Maylene Roes again next week. She is using Medihoney as the primary dressing changing this herself. 03/21/2022: She saw Dr. Ferd Hibbs at the wound care center at [facility]. Dr. Ferd Hibbs was also unable to debride her, secondary to pain and is planning to take her to the operating room for operative debridement and potential skin substitute placement. Her wounds are unchanged with thick yellow slough and a fibrotic base. She says they are too painful to be debrided. In addition, yesterday her hemoglobin was 4 and she received 2 units packed red blood cell transfusion. 04/04/2022: Her operation at [facility] is scheduled for December 15. She continues to use Medihoney on her wounds. They are a little bit less painful today and she is willing to entertain the possibility of debridement. The wounds measured a little bit larger in all dimensions today but the layer of slough is not as thick as usual. 04/18/2022: Her wounds actually  measured a little bit smaller today and the extension onto the dorsal foot from the lateral wound has healed. They are less tender and there is significantly less slough present as compared to prior visits. 05/09/2022: She had to reschedule her  surgery due to lack of childcare. It is now planned for January 5. The wounds are basically unchanged, but she had a lot more drainage, which was thick and somewhat purulent in appearance. No significant odor. 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 They are quite painful today and she does not want to have debridement performed. 05/30/2022: The patient underwent surgical debridement and placement of TheraSkin to her wounds on January 5. She has been doing well and does not endorse any pain. The TheraSkin is intact and looks beautiful. It is sutured in place. There is no exudate or significant drainage. 06/04/2022: Her TheraSkin remains intact and I am starting to see granulation tissue emerging underneath the surface. No concern for infection. 06/12/2022: The TheraSkin is intact and there is more granulation tissue emerging. It is starting to look a little bit dry, however. No malodor or purulent drainage. 06/19/2022: The TheraSkin continues to look good. The edges are a bit dry, but the central portion of each of her wounds is showing a nice pink color. 06/27/2022: The TheraSkin on the lateral wound remains almost completely intact. There is nice pink tissue peaking through the mesh of the TheraSkin. On the medial leg, it is starting to breakdown a little bit, but most of the TheraSkin remains intact here, as well. Both wounds measured smaller today. 07/03/2022: The wounds are fairly dry around the perimeter today. Some of the suture is hanging loose. 07/12/2022: Both wounds are measuring smaller today. The medial ulcer is getting a bit too dry. The lateral ulcer continues to have nice buds of granulation tissue emerging. 07/26/2022: The moisture balance  on both wounds is much better today. For the first time since I began seeing her, I see actual beefy red granulation tissue on the lateral ankle wound. There are more buds of deep pink granulation tissue emerging on the medial ankle. There is some dry eschar around the edges of the medial wound and a tiny amount of slough overlying the good granulation tissue on the lateral wound. 08/09/2022: Unfortunately, she has a new wound on her left lateral lower leg, just above the ankle. It is extremely painful for her. It penetrates to the fat layer and has the same woody, fibrotic character as her previous wounds. The other wounds are both a little bit smaller with slough and some eschar around the periphery. 08/14/2022: Her right lateral ankle wound is a little bit smaller. Unfortunately, she has had an increase in her drainage and it has a purulent nature to it, similar to what was present prior to her surgical debridement. She also reports odor coming from her wounds this week. The left lateral leg wound remains extremely tender. 08/21/2022: The left lateral ankle wound is larger and more painful today. Both right ankle wounds have a little bit more vital appearance, but they have accumulated more slough. She reports less odor this week. She is currently taking Augmentin. 08/28/2022: The left lateral ankle wound continues to enlarge and remains quite painful. Both of the right ankle wounds actually look better this week. There is improved tissue quality with less slough accumulation. 09/27/2022: The left lateral ankle wound actually looks nearly healed. She says that she has been applying some silver alginate that she had leftover. She was unable to afford the Santyl that was prescribed, but found a tube that she had on hand at home and has been using this on her right ankle wounds. These wounds look about the same and have accumulated the  usual layer of slough. Unfortunately, a portion of the dorsal foot wound  has reopened and is exquisitely painful today. 10/09/2022: The left lateral ankle wound is healed. The dorsal foot wound appears to have expanded and remains exquisitely painful. The medial and lateral right ankle wounds look about the same size, but they are quite a bit cleaner. 10/23/2022: The left lateral ankle remains closed. The dorsal foot wound has expanded to the point that it now converges with the right lateral leg wound. She has accumulated fairly thick slough on all of the open surfaces. [name], [name] ([id number]) [id number] Page 13 of 17 11/06/2022: No significant change to any of her wounds, although the dorsal foot aspect of the right lateral wound is a little bit smaller. She has been approved for the [employer] and [employer] for [employer] and will be able to get a full year's supply. 11/20/2022: The dorsal foot aspect of the right lateral wound has almost completely closed. I am seeing nice pink tissue starting to emerge from the fibrotic surface of both wounds. The medial leg wound appears to be a little bit smaller today. She has been using Santyl. 12/04/2022: The dorsal foot aspect of the right lateral wound has closed. There is more pink granulation tissue emerging her wounds. There are buds of epithelium beginning to fill in from the edges of her wound, particularly at the proximal aspect of the medial wound and the distal aspect of the lateral wound. Significantly less slough formation than in the past. Patient History Information obtained from Patient. Family History Diabetes - Mother, Lung Disease - Mother, No family history of Cancer, Heart Disease, Hereditary Spherocytosis, Hypertension, Kidney Disease, Seizures, Stroke, Thyroid Problems, Tuberculosis. Social History Never smoker, Marital Status - Married, Alcohol Use - Never, Drug Use - No History, Caffeine Use - Daily. Medical History Eyes Denies history of Cataracts, Glaucoma, Optic  Neuritis Ear/Nose/Mouth/Throat Denies history of Chronic sinus problems/congestion, Middle ear problems Hematologic/Lymphatic Patient has history of Anemia, Sickle Cell Disease Denies history of Hemophilia, Human Immunodeficiency Virus, Lymphedema Respiratory Denies history of Aspiration, Asthma, Chronic Obstructive Pulmonary Disease (COPD), Pneumothorax, Sleep Apnea, Tuberculosis Cardiovascular Denies history of Angina, Arrhythmia, Congestive Heart Failure, Coronary Artery Disease, Deep Vein Thrombosis, Hypertension, Hypotension, Myocardial Infarction, Peripheral Arterial Disease, Peripheral Venous Disease, Phlebitis, Vasculitis Gastrointestinal Denies history of Cirrhosis , Colitis, Crohns, Hepatitis A, Hepatitis B, Hepatitis C Endocrine Denies history of Type I Diabetes, Type II Diabetes Genitourinary Denies history of End Stage Renal Disease Immunological Denies history of Lupus Erythematosus, Raynauds, Scleroderma Integumentary (Skin) Denies history of History of Burn Musculoskeletal Denies history of Gout, Rheumatoid Arthritis, Osteoarthritis, Osteomyelitis Neurologic Patient has history of Neuropathy - right foot intermittant Denies history of Dementia, Quadriplegia, Paraplegia, Seizure Disorder Oncologic Denies history of Received Chemotherapy, Received Radiation Psychiatric Denies history of Anorexia/bulimia, Confinement Anxiety Hospitalization/Surgery History - c section x2. - left breast lumpectomy. - iandD right ankle with theraskin. Medical A Surgical History Notes nd Constitutional Symptoms (General Health) H/O miscarriage Cardiovascular bradycardia Gastrointestinal cholilithiasis Objective Constitutional no acute distress. Vitals Time Taken: 8:24 AM, Height: 67 in, Weight: 134 lbs, BMI: 21, Temperature: 98.1 F, Pulse: 75 bpm, Respiratory Rate: 16 breaths/min, Blood Pressure: 121/70 mmHg. Respiratory Normal work of breathing on room air. General Notes:  12/04/2022: The dorsal foot aspect of the right lateral wound has closed. There is more pink granulation tissue emerging her wounds. There are buds of epithelium beginning to fill in from the edges of her wound, particularly at the proximal aspect of  the medial wound and the distal aspect of the lateral wound. Significantly less slough formation than in the past. YOSSELYN, TAX ([id number]) [id number] Page 14 of 17 Integumentary (Hair, Skin) Wound #17 status is Open. Original cause of wound was Gradually Appeared. The date acquired was: 10/05/2012. The wound has been in treatment 85 weeks. The wound is located on the Right,Lateral Lower Leg. The wound measures 8cm length x 7cm width x 0.1cm depth; 43.982cm^2 area and 4.398cm^3 volume. There is Fat Layer (Subcutaneous Tissue) exposed. There is no tunneling or undermining noted. There is a medium amount of serosanguineous drainage noted. The wound margin is distinct with the outline attached to the wound base. There is small (1-33%) pink granulation within the wound bed. There is a large (67- 100%) amount of necrotic tissue within the wound bed including Adherent Slough. The periwound skin appearance had no abnormalities noted for moisture. The periwound skin appearance exhibited: Scarring, Hemosiderin Staining. The periwound skin appearance did not exhibit: Ecchymosis. Periwound temperature was noted as No Abnormality. The periwound has tenderness on palpation. Wound #21 status is Open. Original cause of wound was Gradually Appeared. The date acquired was: 06/26/2021. The wound has been in treatment 75 weeks. The wound is located on the Right,Medial Ankle. The wound measures 8.5cm length x 3.8cm width x 0.1cm depth; 25.368cm^2 area and 2.537cm^3 volume. There is Fat Layer (Subcutaneous Tissue) exposed. There is no tunneling or undermining noted. There is a medium amount of serosanguineous drainage noted. The wound margin is  distinct with the outline attached to the wound base. There is small (1-33%) pink granulation within the wound bed. There is a large (67- 100%) amount of necrotic tissue within the wound bed including Adherent Slough. The periwound skin appearance had no abnormalities noted for moisture. The periwound skin appearance had no abnormalities noted for color. The periwound skin appearance exhibited: Scarring. Periwound temperature was noted as No Abnormality. The periwound has tenderness on palpation. 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 There was a Selective/Open Wound Non-Viable Tissue Debridement with a total area of 32.97 sq cm performed by Duanne Guess, MD. With the following instrument(s): Curette to remove Non-Viable tissue/material. Material removed includes [facility] after achieving pain control using Lidocaine 4% Topical Solution. No specimens were taken. A time out was conducted at 08:48, prior to the start of the procedure. A Minimum amount of bleeding was controlled with Pressure. The procedure was tolerated well. Post Debridement Measurements: 8cm length x 7cm width x 0.1cm depth; 4.398cm^3 volume. Character of Wound/Ulcer Post Debridement is improved. Post procedure Diagnosis Wound #17: Same as Pre-Procedure General Notes: scribed for Dr. Lady Gary by Samuella Bruin, RN. Wound #21 Pre-procedure diagnosis of Wound #21 is a Sickle Cell Lesion located on the Right,Medial Ankle .Severity of Tissue Pre Debridement is: Fat layer exposed. There was a Selective/Open Wound Non-Viable Tissue Debridement with a total area of 17.75 sq cm performed by Duanne Guess, MD. With the following instrument(s): Curette to remove Non-Viable tissue/material.  Material removed includes [facility] after achieving pain control using Lidocaine 4% Topical Solution. No specimens were taken. A time out was conducted at 08:48, prior to the start of the procedure. A Minimum amount of bleeding was controlled with Pressure. The procedure was tolerated well. Post Debridement Measurements: 8.5cm length x 3.8cm width x  0.1cm depth; 2.537cm^3 volume. Character of Wound/Ulcer Post Debridement is improved. Severity of Tissue Post Debridement is: Fat layer exposed. Post procedure Diagnosis Wound #21: Same as Pre-Procedure General Notes: scribed for Dr. Lady Gary by Samuella Bruin, RN. Plan Follow-up Appointments: Return Appointment in 2 weeks. - Dr. Lady Gary Rm 2 Anesthetic: (In clinic) Topical Lidocaine 5% applied to wound bed (In clinic) Topical Lidocaine 4% applied to wound bed Bathing/ Shower/ Hygiene: May shower and wash wound with soap and water. Edema Control - Lymphedema / SCD / Other: Elevate legs to the level of the heart or above for 30 minutes daily and/or when sitting for 3-4 times a day throughout the day. Avoid standing for long periods of time. Exercise regularly Additional Orders / Instructions: Follow Nutritious Diet Juven Shake 1-2 times daily. The following medication(s) was prescribed: lidocaine topical 4 % cream cream topical was prescribed at facility WOUND #17: - Lower Leg Wound Laterality: Right, Lateral Cleanser: Soap and Water 1 x Per Week/30 Days Discharge Instructions: May shower and wash wound with dial antibacterial soap and water prior to dressing change. Cleanser: Wound Cleanser 1 x Per Week/30 Days Discharge Instructions: Cleanse the wound with wound cleanser prior to applying a clean dressing using gauze sponges, not tissue or cotton balls. Peri-Wound Care: Triamcinolone 15 (g) 1 x Per Week/30 Days Discharge Instructions: Use triamcinolone 15 (g) as directed Peri-Wound Care: Sween Lotion (Moisturizing lotion) 1 x Per Week/30  Days Discharge Instructions: Apply moisturizing lotion as directed Prim Dressing: Santyl Ointment 1 x Per Week/30 Days ary Discharge Instructions: Apply nickel thick amount to wound bed as instructed [name] ([id number]) [id number] Page 15 of 17 Secondary Dressing: ABD Pad, 5x9 1 x Per Week/30 Days Discharge Instructions: Apply over primary dressing as directed. Secondary Dressing: Woven Gauze Sponge, Non-Sterile 4x4 in 1 x Per Week/30 Days Discharge Instructions: Moisten with normal saline and Apply over primary dressing as directed. Secured With: Elastic Bandage 4 inch (ACE bandage) 1 x Per Week/30 Days Discharge Instructions: Secure with ACE bandage as directed. Secured With: [employer], 4.5x3.1 (in/yd) 1 x Per Week/30 Days Discharge Instructions: Secure with Kerlix as directed. WOUND #21: - Ankle Wound Laterality: Right, Medial Cleanser: Soap and Water 1 x Per Week/30 Days Discharge Instructions: May shower and wash wound with dial antibacterial soap and water prior to dressing change. Cleanser: Wound Cleanser 1 x Per Week/30 Days Discharge Instructions: Cleanse the wound with wound cleanser prior to applying a clean dressing using gauze sponges, not tissue or cotton balls. Peri-Wound Care: Triamcinolone 15 (g) 1 x Per Week/30 Days Discharge Instructions: Use triamcinolone 15 (g) as directed Peri-Wound Care: Sween Lotion (Moisturizing lotion) 1 x Per Week/30 Days Discharge Instructions: Apply moisturizing lotion as directed Prim Dressing: Santyl Ointment 1 x Per Week/30 Days ary Discharge Instructions: Apply nickel thick amount to wound bed as instructed Secondary Dressing: ABD Pad, 5x9 1 x Per Week/30 Days Discharge Instructions: Apply over primary dressing as directed. Secondary Dressing: Woven Gauze Sponge, Non-Sterile 4x4 in 1 x Per Week/30 Days Discharge Instructions: Moisten with normal saline and Apply over primary dressing as  directed. Secured With: Elastic Bandage 4 inch (ACE bandage) 1 x Per Week/30 Days Discharge Instructions: Secure with ACE bandage as directed. Secured With: [employer], 4.5x3.1 (in/yd) 1 x Per Week/30 Days Discharge Instructions: Secure with Kerlix as directed. 12/04/2022: The dorsal foot aspect of the right lateral wound has closed. There is more pink granulation tissue emerging her wounds. There are buds of epithelium beginning  to fill in from the edges of her wound, particularly at the proximal aspect of the medial wound and the distal aspect of the lateral wound. Significantly less slough formation than in the past. I used a curette to debride slough from the surface of both of her wounds. We will continue Santyl with Kerlix and Ace bandaging. Follow-up in 2 weeks. Electronic Signature(s) Signed: 12/04/2022 8:57:35 AM By: Duanne Guess MD FACS Entered By: Duanne Guess on 12/04/2022 08:57:35 -------------------------------------------------------------------------------- HxROS Details Patient Name: Date of Service: [name] 12/04/2022 8:30 A M Medical Record Number: [id number] Patient Account Number: [id number] Date of Birth/Sex: Treating RN: 12/19/1970 (52 y.o. F) Primary Care Provider: Julianne Handler Other Clinician: Referring Provider: Treating Provider/Extender: Koleen Nimrod Weeks in Treatment: 57 Information Obtained From Patient Constitutional Symptoms (General Health) Medical History: Past Medical History Notes: H/O miscarriage Eyes Medical History: Negative for: Cataracts; Glaucoma; Optic Neuritis Ear/Nose/Mouth/Throat Medical History: Negative for: Chronic sinus problems/congestion; Middle ear problems Hematologic/Lymphatic Medical HistoryALTHEA, BACKS ([id number]) [id number] Page 16 of 17 Positive for: Anemia; Sickle Cell Disease Negative for: Hemophilia; Human Immunodeficiency Virus;  Lymphedema Respiratory Medical History: Negative for: Aspiration; Asthma; Chronic Obstructive Pulmonary Disease (COPD); Pneumothorax; Sleep Apnea; Tuberculosis Cardiovascular Medical History: Negative for: Angina; Arrhythmia; Congestive Heart Failure; Coronary Artery Disease; Deep Vein Thrombosis; Hypertension; Hypotension; Myocardial Infarction; Peripheral Arterial Disease; Peripheral Venous Disease; Phlebitis; Vasculitis Past Medical History Notes: bradycardia Gastrointestinal Medical History: Negative for: Cirrhosis ; Colitis; Crohns; Hepatitis A; Hepatitis B; Hepatitis C Past Medical History Notes: cholilithiasis Endocrine Medical History: Negative for: Type I Diabetes; Type II Diabetes Genitourinary Medical History: Negative for: End Stage Renal Disease Immunological Medical History: Negative for: Lupus Erythematosus; Raynauds; Scleroderma Integumentary (Skin) Medical History: Negative for: History of Burn Musculoskeletal Medical History: Negative for: Gout; Rheumatoid Arthritis; Osteoarthritis; Osteomyelitis Neurologic Medical History: Positive for: Neuropathy - right foot intermittant Negative for: Dementia; Quadriplegia; Paraplegia; Seizure Disorder Oncologic Medical History: Negative for: Received Chemotherapy; Received Radiation Psychiatric Medical History: Negative for: Anorexia/bulimia; Confinement Anxiety Immunizations Pneumococcal Vaccine: Received Pneumococcal Vaccination: No Implantable Devices None Hospitalization / Surgery History Type of Hospitalization/Surgery c section x2 left breast lumpectomy iandD right ankle with theraskin [name] ([id number]) [id number] Page 10 of 80 Family and Social History Cancer: No; Diabetes: Yes - Mother; Heart Disease: No; Hereditary Spherocytosis: No; Hypertension: No; Kidney Disease: No; Lung Disease: Yes - Mother; Seizures: No; Stroke: No; Thyroid Problems: No; Tuberculosis:  No; Never smoker; Marital Status - Married; Alcohol Use: Never; Drug Use: No History; Caffeine Use: Daily; Financial Concerns: No; Food, Clothing or Shelter Needs: No; Support System Lacking: No; Transportation Concerns: No Electronic Signature(s) Signed: 12/04/2022 12:38:39 PM By: Duanne Guess MD FACS Entered By: Duanne Guess on 12/04/2022 08:53:42 -------------------------------------------------------------------------------- SuperBill Details Patient Name: Date of Service: [name] 12/04/2022 Medical Record Number: [id number] Patient Account Number: [id number] Date of Birth/Sex: Treating RN: 10/14/1970 (52 y.o. 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 Facility Procedures : CPT4 Code: [id number] Description: 97597 - DEBRIDE WOUND 1ST 20 SQ CM OR < ICD-10 Diagnosis Description L97.818 Non-pressure chronic ulcer of other part of right lower leg with other specified s Modifier: everity Quantity: 1 : CPT4 Code: [id number] Description: 97598 - DEBRIDE WOUND EA ADDL 20 SQ CM ICD-10 Diagnosis Description L97.818  Non-pressure chronic ulcer of other part of right lower leg with other specified s Modifier: everity Quantity: 2 Physician Procedures : CPT4 Code Description Modifier [id number] 99214 - WC PHYS LEVEL 4 - EST PT 25 ICD-10 Diagnosis Description L97.818 Non-pressure chronic ulcer of other part of right lower leg with other specified severity 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

## 2022-12-17 ENCOUNTER — Other Ambulatory Visit: Payer: Self-pay | Admitting: Family Medicine

## 2022-12-17 DIAGNOSIS — D571 Sickle-cell disease without crisis: Secondary | ICD-10-CM

## 2022-12-18 ENCOUNTER — Encounter (HOSPITAL_BASED_OUTPATIENT_CLINIC_OR_DEPARTMENT_OTHER): Payer: Medicaid Other | Admitting: General Surgery

## 2022-12-18 DIAGNOSIS — L97818 Non-pressure chronic ulcer of other part of right lower leg with other specified severity: Secondary | ICD-10-CM | POA: Diagnosis not present

## 2022-12-18 NOTE — Progress Notes (Signed)
[name], [name] ([id number]) [id number] Page 1 of 10 Visit Report for 12/18/2022 Arrival Information Details Patient Name: Date of Service: [name] [name] 12/18/2022 8:30 A M Medical Record Number: [id number] Patient Account Number: [id number] Date of Birth/Sex: Treating RN: 05/24/1970 (52 y.o. [name] Primary Care [name]: Julianne Handler Other Clinician: Referring Shervin Cypert: Treating [name]/Extender: Koleen Nimrod Weeks in Treatment: 37 Visit Information History Since Last Visit Added or deleted any medications: No Patient Arrived: [name] Any new allergies or adverse reactions: No Arrival Time: 08:35 Had a fall or experienced change in No Accompanied By: sister activities of daily living that may affect Transfer Assistance: None risk of falls: Patient Identification Verified: Yes Signs or symptoms of abuse/neglect since last visito No Secondary Verification Process Completed: Yes Hospitalized since last visit: No Patient Requires Transmission-Based Precautions: No Implantable device outside of the clinic excluding No Patient Has Alerts: No cellular tissue based products placed in the center since last visit: Has Dressing in Place as Prescribed: Yes Has Compression in Place as Prescribed: Yes Pain Present Now: Yes Electronic Signature(s) Signed: 12/18/2022 4:19:12 PM By: Samuella Bruin Entered By: Samuella Bruin on 12/18/2022 08:37:26 -------------------------------------------------------------------------------- Encounter Discharge Information Details Patient Name: Date of Service: [name], [name] 12/18/2022 8:30 A M Medical Record Number: [id number] Patient Account Number: [id number] Date of Birth/Sex: Treating RN: Jun 09, 1970 (52 y.o. [name] Primary Care Murrel Freet: Julianne Handler Other Clinician: Referring Zamariah Seaborn: Treating Margaux Engen/Extender: Koleen Nimrod Weeks in  Treatment: 71 Encounter Discharge Information Items Post Procedure Vitals Discharge Condition: Stable Temperature (F): 98 Ambulatory Status: Cane Pulse (bpm): 94 Discharge Destination: Home Respiratory Rate (breaths/min): 18 Transportation: Private Auto Blood Pressure (mmHg): 144/70 Accompanied By: sister Schedule Follow-up Appointment: Yes Clinical Summary of Care: Patient Declined Electronic Signature(s) Signed: 12/18/2022 4:19:12 PM By: Samuella Bruin Entered By: Samuella Bruin on 12/18/2022 09:19:44 [name] ([id number]) [id number] Page 2 of 10 -------------------------------------------------------------------------------- Lower Extremity Assessment Details Patient Name: Date of Service: [name] [name] 12/18/2022 8:30 A M Medical Record Number: [id number] Patient Account Number: [id number] Date of Birth/Sex: Treating RN: [birth year] (52 y.o. [name] Primary Care Amen Dargis: Julianne Handler Other Clinician: Referring Daeshaun Specht: Treating Maeci Kalbfleisch/Extender: Koleen Nimrod Weeks in Treatment: 87 Edema Assessment Assessed: [Left: No] [Right: No] Edema: [Left: N] [Right: o] Calf Left: Right: Point of Measurement: 33 cm From Medial Instep 31 cm Ankle Left: Right: Point of Measurement: 10 cm From Medial Instep 20 cm Vascular Assessment Pulses: Dorsalis Pedis Palpable: [Right:Yes] Electronic Signature(s) Signed: 12/18/2022 4:19:12 PM By: Samuella Bruin Entered By: Samuella Bruin on 12/18/2022 08:43:58 -------------------------------------------------------------------------------- Multi Wound Chart Details Patient Name: Date of Service: [name], [name] 12/18/2022 8:30 A M Medical Record Number: [id number] Patient Account Number: [id number] Date of Birth/Sex: Treating RN: 1971/01/13 (52 y.o. F) Primary Care Ailsa Mireles: Julianne Handler Other Clinician: Referring Sariah Henkin: Treating  Kamayah Pillay/Extender: Koleen Nimrod Weeks in Treatment: 49 Vital Signs Height(in): 67 Pulse(bpm): 94 Weight(lbs): 134 Blood Pressure(mmHg): 144/70 Body Mass Index(BMI): 21 Temperature(F): 98 Respiratory Rate(breaths/min): 18 [17:Photos:] [N/A:N/A [id number] Page 3 of 10] Right, Lateral Lower Leg Right, Medial Ankle N/A Wound Location: Gradually Appeared Gradually Appeared N/A Wounding Event: Sickle Cell Lesion Sickle Cell Lesion N/A Primary Etiology: N/A Venous Leg Ulcer N/A Secondary Etiology: Anemia, Sickle Cell Disease, Anemia, Sickle Cell Disease, N/A Comorbid History: Neuropathy Neuropathy 10/05/2012 06/26/2021 N/A Date Acquired: 64 77 N/A Weeks of Treatment: Open Open N/A Wound Status: No No N/A Wound Recurrence: Yes No N/A  Clustered Wound: 8x7x0.1 9x4x0.1 N/A Measurements L x W x D (cm) 43.982 28.274 N/A A (cm) : rea 4.398 2.827 N/A Volume (cm) : 76.60% 2.40% N/A % Reduction in A rea: 76.60% 2.40% N/A % Reduction in Volume: Full Thickness Without Exposed Full Thickness Without Exposed N/A Classification: Support Structures Support Structures Medium Medium N/A Exudate A mount: Serosanguineous Serosanguineous N/A Exudate Type: red, brown red, brown N/A Exudate Color: Distinct, outline attached Distinct, outline attached N/A Wound Margin: Small (1-33%) Small (1-33%) N/A Granulation A mount: Pink Pink N/A Granulation Quality: Large (67-100%) Large (67-100%) N/A Necrotic A mount: Fat Layer (Subcutaneous Tissue): Yes Fat Layer (Subcutaneous Tissue): Yes N/A Exposed Structures: Fascia: No Fascia: No Tendon: No Tendon: No Muscle: No Muscle: No Joint: No Joint: No Bone: No Bone: No Small (1-33%) Small (1-33%) N/A Epithelialization: Debridement - Selective/Open Wound Debridement - Selective/Open Wound N/A Debridement: Pre-procedure Verification/Time Out 09:06 09:06 N/A Taken: Lidocaine 4% Topical  Solution Lidocaine 4% Topical Solution N/A Pain Control: [employer] N/A Tissue Debrided: Non-Viable Tissue Non-Viable Tissue N/A Level: 35.17 22.61 N/A Debridement A (sq cm): rea Curette Curette N/A Instrument: Minimum Minimum N/A Bleeding: Pressure Pressure N/A Hemostasis A chieved: Procedure was tolerated well Procedure was tolerated well N/A Debridement Treatment Response: 8x7x0.1 9x4x0.1 N/A Post Debridement Measurements L x W x D (cm) 4.398 2.827 N/A Post Debridement Volume: (cm) Scarring: Yes Scarring: Yes N/A Periwound Skin Texture: No Abnormalities Noted No Abnormalities Noted N/A Periwound Skin Moisture: Hemosiderin Staining: Yes No Abnormalities Noted N/A Periwound Skin Color: Ecchymosis: No No Abnormality No Abnormality N/A Temperature: Yes Yes N/A Tenderness on Palpation: Debridement Debridement N/A Procedures Performed: Treatment Notes Wound #17 (Lower Leg) Wound Laterality: Right, Lateral Cleanser Soap and Water Discharge Instruction: May shower and wash wound with dial antibacterial soap and water prior to dressing change. Wound Cleanser Discharge Instruction: Cleanse the wound with wound cleanser prior to applying a clean dressing using gauze sponges, not tissue or cotton balls. Peri-Wound Care Triamcinolone 15 (g) Discharge Instruction: Use triamcinolone 15 (g) as directed Sween Lotion (Moisturizing lotion) Discharge Instruction: Apply moisturizing lotion as directed Topical Primary Dressing Santyl Ointment Discharge Instruction: Apply nickel thick amount to wound bed as instructed Secondary Dressing ABD Pad, 5x9 Discharge Instruction: Apply over primary dressing as directed. Woven Gauze Sponge, Non-Sterile 4x4 in [place], [name] ([id number]) [id number] Page 4 of 10 Discharge Instruction: Moisten with normal saline and Apply over primary dressing as directed. Secured With Elastic Bandage 4 inch (ACE  bandage) Discharge Instruction: Secure with ACE bandage as directed. Kerlix Roll Sterile, 4.5x3.1 (in/yd) Discharge Instruction: Secure with Kerlix as directed. Compression Wrap Compression Stockings Add-Ons Wound #21 (Ankle) Wound Laterality: Right, Medial Cleanser Soap and Water Discharge Instruction: May shower and wash wound with dial antibacterial soap and water prior to dressing change. Wound Cleanser Discharge Instruction: Cleanse the wound with wound cleanser prior to applying a clean dressing using gauze sponges, not tissue or cotton balls. Peri-Wound Care Triamcinolone 15 (g) Discharge Instruction: Use triamcinolone 15 (g) as directed Sween Lotion (Moisturizing lotion) Discharge Instruction: Apply moisturizing lotion as directed Topical Primary Dressing Santyl Ointment Discharge Instruction: Apply nickel thick amount to wound bed as instructed Secondary Dressing ABD Pad, 5x9 Discharge Instruction: Apply over primary dressing as directed. Woven Gauze Sponge, Non-Sterile 4x4 in Discharge Instruction: Moisten with normal saline and Apply over primary dressing as directed. Secured With Elastic Bandage 4 inch (ACE bandage) Discharge Instruction: Secure with ACE bandage as directed. Kerlix Roll Sterile, 4.5x3.1 (in/yd) Discharge Instruction: Secure with Kerlix  as directed. Compression Wrap Compression Stockings Add-Ons Electronic Signature(s) Signed: 12/18/2022 9:42:19 AM By: Duanne Guess MD FACS Entered By: Duanne Guess on 12/18/2022 09:42:19 -------------------------------------------------------------------------------- Multi-Disciplinary Care Plan Details Patient Name: Date of Service: [name] URO Annia Belt, [name] 12/18/2022 8:30 A M Medical Record Number: [id number] Patient Account Number: [id number] Date of Birth/Sex: Treating RN: 02-Sep-1970 (52 y.o. [name] Primary Care [name]: Julianne Handler Other Clinician: Referring Nasier Thumm: Treating  Malana Eberwein/Extender: Koleen Nimrod Weeks in Treatment: [address] [place] ([id number]) [id number] Page 5 of 10 Multidisciplinary Care Plan reviewed with physician Active Inactive Necrotic Tissue Nursing Diagnoses: Impaired tissue integrity related to necrotic/devitalized tissue Knowledge deficit related to management of necrotic/devitalized tissue Goals: Necrotic/devitalized tissue will be minimized in the wound bed Date Initiated: 12/04/2022 Target Resolution Date: 01/24/2023 Goal Status: Active Patient/caregiver will verbalize understanding of reason and process for debridement of necrotic tissue Date Initiated: 12/04/2022 Target Resolution Date: 01/24/2023 Goal Status: Active Interventions: Assess patient pain level pre-, during and post procedure and prior to discharge Provide education on necrotic tissue and debridement process Treatment Activities: Apply topical anesthetic as ordered : 12/04/2022 Enzymatic debridement : 12/04/2022 Notes: Electronic Signature(s) Signed: 12/18/2022 4:19:12 PM By: Samuella Bruin Entered By: Samuella Bruin on 12/18/2022 09:02:40 -------------------------------------------------------------------------------- Pain Assessment Details Patient Name: Date of Service: [name], [name] 12/18/2022 8:30 A M Medical Record Number: [id number] Patient Account Number: [id number] Date of Birth/Sex: Treating RN: [birth year] (52 y.o. [name] Primary Care Dantonio Justen: Julianne Handler Other Clinician: Referring Delfino Friesen: Treating [name]/Extender: Koleen Nimrod Weeks in Treatment: 74 Active Problems Location of Pain Severity and Description of Pain Patient Has Paino Yes Site Locations Pain Location: Pain in Ulcers Duration of the Pain. Constant / Intermittento Intermittent Rate the pain. Current Pain Level: 8 Character of Pain [name], ROETHLISBERGER ([id number])  [id number] Page 6 of 10 Describe the Pain: Burning Pain Management and Medication Current Pain Management: Medication: Yes Electronic Signature(s) Signed: 12/18/2022 4:19:12 PM By: Samuella Bruin Entered By: Samuella Bruin on 12/18/2022 08:37:55 -------------------------------------------------------------------------------- Patient/Caregiver Education Details Patient Name: Date of Service: [name], [name] 7/31/2024andnbsp8:30 A M Medical Record Number: [id number] Patient Account Number: [id number] Date of Birth/Gender: Treating RN: 06-25-1970 (52 y.o. [name] Primary Care Physician: Julianne Handler Other Clinician: Referring Physician: Treating Physician/Extender: Koleen Nimrod Weeks in Treatment: 59 Education Assessment Education Provided To: Patient Education Topics Provided Wound/Skin Impairment: Methods: Explain/Verbal Responses: Reinforcements needed, State content correctly Electronic Signature(s) Signed: 12/18/2022 4:19:12 PM By: Samuella Bruin Entered By: Samuella Bruin on 12/18/2022 09:02:56 -------------------------------------------------------------------------------- Wound Assessment Details Patient Name: Date of Service: [name], [name] 12/18/2022 8:30 A M Medical Record Number: [id number] Patient Account Number: [id number] Date of Birth/Sex: Treating RN: [birth year] (52 y.o. [name] Primary Care Demaryius Imran: Julianne Handler Other Clinician: Referring Luisdaniel Kenton: Treating [name]/Extender: Koleen Nimrod Weeks in Treatment: 58 Wound Status Wound Number: 17 Primary Etiology: Sickle Cell Lesion Wound Location: Right, Lateral Lower Leg Wound Status: Open Wounding Event: Gradually Appeared Comorbid History: Anemia, Sickle Cell Disease, Neuropathy Date Acquired: 10/05/2012 Weeks Of Treatment: 87 Clustered Wound: Yes Photos [name] ([id number])  [id number] Page 7 of 10 Wound Measurements Length: (cm) 8 Width: (cm) 7 Depth: (cm) 0.1 Area: (cm) 43.982 Volume: (cm) 4.398 % Reduction in Area: 76.6% % Reduction in Volume: 76.6% Epithelialization: Small (1-33%) Tunneling: No Undermining: No Wound Description Classification: Full Thickness Without Exposed Support Structures Wound Margin: Distinct, outline attached Exudate Amount: Medium Exudate Type: Serosanguineous Exudate Color: red, brown  Foul Odor After Cleansing: No Slough/Fibrino Yes Wound Bed Granulation Amount: Small (1-33%) Exposed Structure Granulation Quality: Pink Fascia Exposed: No Necrotic Amount: Large (67-100%) Fat Layer (Subcutaneous Tissue) Exposed: Yes Necrotic Quality: Adherent Slough Tendon Exposed: No Muscle Exposed: No Joint Exposed: No Bone Exposed: No Periwound Skin Texture Texture Color No Abnormalities Noted: No No Abnormalities Noted: No Scarring: Yes Ecchymosis: No Hemosiderin Staining: Yes Moisture No Abnormalities Noted: Yes Temperature / Pain Temperature: No Abnormality Tenderness on Palpation: Yes Treatment Notes Wound #17 (Lower Leg) Wound Laterality: Right, Lateral Cleanser Soap and Water Discharge Instruction: May shower and wash wound with dial antibacterial soap and water prior to dressing change. Wound Cleanser Discharge Instruction: Cleanse the wound with wound cleanser prior to applying a clean dressing using gauze sponges, not tissue or cotton balls. Peri-Wound Care Triamcinolone 15 (g) Discharge Instruction: Use triamcinolone 15 (g) as directed Sween Lotion (Moisturizing lotion) Discharge Instruction: Apply moisturizing lotion as directed Topical Primary Dressing Santyl Ointment Discharge Instruction: Apply nickel thick amount to wound bed as instructed Secondary Dressing ABD Pad, 5x9 Discharge Instruction: Apply over primary dressing as directed. Woven Gauze Sponge, Non-Sterile 4x4  in Discharge Instruction: Moisten with normal saline and Apply over primary dressing as directed. MADELLE, PANZER ([id number]) [id number] Page 8 of 10 Secured With Elastic Bandage 4 inch (ACE bandage) Discharge Instruction: Secure with ACE bandage as directed. Kerlix Roll Sterile, 4.5x3.1 (in/yd) Discharge Instruction: Secure with Kerlix as directed. Compression Wrap Compression Stockings Add-Ons Electronic Signature(s) Signed: 12/18/2022 4:19:12 PM By: Samuella Bruin Entered By: Samuella Bruin on 12/18/2022 08:46:50 -------------------------------------------------------------------------------- Wound Assessment Details Patient Name: Date of Service: [name], [name] 12/18/2022 8:30 A M Medical Record Number: [id number] Patient Account Number: [id number] Date of Birth/Sex: Treating RN: 01-27-1971 (52 y.o. [name] Primary Care Braelynne Garinger: Julianne Handler Other Clinician: Referring [name]: Treating [name]/Extender: Koleen Nimrod Weeks in Treatment: 87 Wound Status Wound Number: 21 Primary Etiology: Sickle Cell Lesion Wound Location: Right, Medial Ankle Secondary Etiology: Venous Leg Ulcer Wounding Event: Gradually Appeared Wound Status: Open Date Acquired: 06/26/2021 Comorbid History: Anemia, Sickle Cell Disease, Neuropathy Weeks Of Treatment: 77 Clustered Wound: No Photos Wound Measurements Length: (cm) 9 Width: (cm) 4 Depth: (cm) 0.1 Area: (cm) 28.274 Volume: (cm) 2.827 % Reduction in Area: 2.4% % Reduction in Volume: 2.4% Epithelialization: Small (1-33%) Tunneling: No Undermining: No Wound Description Classification: Full Thickness Without Exposed Support Structures Wound Margin: Distinct, outline attached Exudate Amount: Medium Exudate Type: Serosanguineous Exudate Color: red, brown Foul Odor After Cleansing: No Slough/Fibrino Yes Wound Bed Granulation Amount: Small (1-33%) Exposed  Structure Granulation Quality: Pink Fascia Exposed: No Necrotic Amount: Large (67-100%) Fat Layer (Subcutaneous Tissue) Exposed: Yes Necrotic Quality: Adherent Slough Tendon Exposed: No [name] ([id number]) [id number] Page 9 of 10 Muscle Exposed: No Joint Exposed: No Bone Exposed: No Periwound Skin Texture Texture Color No Abnormalities Noted: No No Abnormalities Noted: Yes Scarring: Yes Temperature / Pain Temperature: No Abnormality Moisture No Abnormalities Noted: Yes Tenderness on Palpation: Yes Treatment Notes Wound #21 (Ankle) Wound Laterality: Right, Medial Cleanser Soap and Water Discharge Instruction: May shower and wash wound with dial antibacterial soap and water prior to dressing change. Wound Cleanser Discharge Instruction: Cleanse the wound with wound cleanser prior to applying a clean dressing using gauze sponges, not tissue or cotton balls. Peri-Wound Care Triamcinolone 15 (g) Discharge Instruction: Use triamcinolone 15 (g) as directed Sween Lotion (Moisturizing lotion) Discharge Instruction: Apply moisturizing lotion as directed Topical Primary Dressing Santyl Ointment Discharge Instruction: Apply nickel thick  amount to wound bed as instructed Secondary Dressing ABD Pad, 5x9 Discharge Instruction: Apply over primary dressing as directed. Woven Gauze Sponge, Non-Sterile 4x4 in Discharge Instruction: Moisten with normal saline and Apply over primary dressing as directed. Secured With Elastic Bandage 4 inch (ACE bandage) Discharge Instruction: Secure with ACE bandage as directed. Kerlix Roll Sterile, 4.5x3.1 (in/yd) Discharge Instruction: Secure with Kerlix as directed. Compression Wrap Compression Stockings Add-Ons Electronic Signature(s) Signed: 12/18/2022 4:19:12 PM By: Samuella Bruin Entered By: Samuella Bruin on 12/18/2022  08:47:14 -------------------------------------------------------------------------------- Vitals Details Patient Name: Date of Service: [name] URO Annia Belt, [name] 12/18/2022 8:30 A M Medical Record Number: [id number] Patient Account Number: [id number] Date of Birth/Sex: Treating RN: 1971-04-05 (52 y.o. [name] Primary Care Miller Limehouse: Julianne Handler Other Clinician: Referring Trae [name]: Treating [name]/Extender: Koleen Nimrod Weeks in Treatment: [address] [place], [name] ([id number]) [id number] Page 10 of 10 Time Taken: 08:37 Temperature (F): 98 Height (in): 67 Pulse (bpm): 94 Weight (lbs): 134 Respiratory Rate (breaths/min): 18 Body Mass Index (BMI): 21 Blood Pressure (mmHg): 144/70 Reference Range: 80 - 120 mg / dl Electronic Signature(s) Signed: 12/18/2022 4:19:12 PM By: Samuella Bruin Entered By: Samuella Bruin on 12/18/2022 08:37:44

## 2022-12-18 NOTE — Progress Notes (Signed)
MAITHILI, GUIDA ([id number]) [id number] Page 1 of 17 Visit Report for 12/18/2022 Chief Complaint Document Details Patient Name: Date of Service: [name] [name] 12/18/2022 8:30 A M Medical Record Number: [id number] Patient Account Number: [id number] Date of Birth/Sex: Treating RN: 1970-06-19 (52 y.o. F) Primary Care Provider: Julianne Handler Other Clinician: Referring Provider: Treating Provider/Extender: Koleen Nimrod Weeks in Treatment: 56 Information Obtained from: Patient Chief Complaint the patient is here for evaluation of her bilateral lower extremity sickle cell ulcers 04/17/2021; patient comes in for substantial wounds on the right and left lower leg Electronic Signature(s) Signed: 12/18/2022 9:42:31 AM By: Duanne Guess MD FACS Entered By: Duanne Guess on 12/18/2022 09:42:31 -------------------------------------------------------------------------------- Debridement Details Patient Name: Date of Service: [name], [name] 12/18/2022 8:30 A M Medical Record Number: [id number] Patient Account Number: [id number] Date of Birth/Sex: Treating RN: 1970-12-24 (52 y.o. [name] Primary Care Provider: Julianne Handler Other Clinician: Referring Provider: Treating Provider/Extender: Koleen Nimrod Weeks in Treatment: 87 Debridement Performed for Assessment: Wound #21 Right,Medial Ankle Performed By: Physician Duanne Guess, MD Debridement Type: Debridement Severity of Tissue Pre Debridement: Fat layer exposed Level of Consciousness (Pre-procedure): Awake and Alert Pre-procedure Verification/Time Out Yes - 09:06 Taken: Start Time: 09:06 Pain Control: Lidocaine 4% T opical Solution Percent of Wound Bed Debrided: 80% T Area Debrided (cm): otal 22.61 Tissue and other material debrided: Non-Viable, Slough, Slough Level: Non-Viable Tissue Debridement Description: Selective/Open Wound Instrument:  Curette Bleeding: Minimum Hemostasis Achieved: Pressure Response to Treatment: Procedure was tolerated well Level of Consciousness (Post- Awake and Alert procedure): Post Debridement Measurements of Total Wound Length: (cm) 9 Width: (cm) 4 Depth: (cm) 0.1 Volume: (cm) 2.827 Character of Wound/Ulcer Post Debridement: Improved Severity of Tissue Post Debridement: Fat layer exposed [name] ([id number]) [id number] Page 2 of 17 Post Procedure Diagnosis Same as Pre-procedure Notes scribed for Dr. Lady Gary by Samuella Bruin, RN Electronic Signature(s) Signed: 12/18/2022 12:15:41 PM By: Duanne Guess MD FACS Signed: 12/18/2022 4:19:12 PM By: Samuella Bruin Entered By: Samuella Bruin on 12/18/2022 09:06:49 -------------------------------------------------------------------------------- Debridement Details Patient Name: Date of Service: [name], [name] 12/18/2022 8:30 A M Medical Record Number: [id number] Patient Account Number: [id number] Date of Birth/Sex: Treating RN: 02/05/1971 (52 y.o. [name] Primary Care Provider: Julianne Handler Other Clinician: Referring Provider: Treating Provider/Extender: Koleen Nimrod Weeks in Treatment: 87 Debridement Performed for Assessment: Wound #17 Right,Lateral Lower Leg Performed By: Physician Duanne Guess, MD Debridement Type: Debridement Level of Consciousness (Pre-procedure): Awake and Alert Pre-procedure Verification/Time Out Yes - 09:06 Taken: Start Time: 09:06 Pain Control: Lidocaine 4% T opical Solution Percent of Wound Bed Debrided: 80% T Area Debrided (cm): otal 35.17 Tissue and other material debrided: Non-Viable, Slough, Slough Level: Non-Viable Tissue Debridement Description: Selective/Open Wound Instrument: Curette Bleeding: Minimum Hemostasis Achieved: Pressure Response to Treatment: Procedure was tolerated well Level of Consciousness (Post-  Awake and Alert procedure): Post Debridement Measurements of Total Wound Length: (cm) 8 Width: (cm) 7 Depth: (cm) 0.1 Volume: (cm) 4.398 Character of Wound/Ulcer Post Debridement: Improved Post Procedure Diagnosis Same as Pre-procedure Notes scribed for Dr. Lady Gary by Samuella Bruin, RN Electronic Signature(s) Signed: 12/18/2022 12:15:41 PM By: Duanne Guess MD FACS Signed: 12/18/2022 4:19:12 PM By: Samuella Bruin Entered By: Samuella Bruin on 12/18/2022 09:07:13 [name] ([id number]) [id number] Page 3 of 17 -------------------------------------------------------------------------------- HPI Details Patient Name: Date of Service: [name] [name] 12/18/2022 8:30 A M Medical Record Number: [id number] Patient Account Number: [id number] Date of Birth/Sex: Treating  RN: [name] (52 y.o. F) Primary Care Provider: Julianne Handler Other Clinician: Referring Provider: Treating Provider/Extender: Koleen Nimrod Weeks in Treatment: 59 History of Present Illness Location: medial and lateral ankle region on the right and left medial malleolus Quality: Patient reports experiencing a shooting pain to affected area(s). Severity: Patient states wound(s) are getting worse. Duration: right lower extremity bimalleolar ulcers have been present for approximately 2 years; the right medial malleolus ulcer has been there proximally 6 months Timing: Pain in wound is constant (hurts all the time) Context: The wound would happen gradually ssociated Signs and Symptoms: Patient reports having increase discharge. A HPI Description: 52 year old patient with a history of sickle cell anemia who was last seen by me with ulceration of the right lower extremity above the ankle and was referred to Dr. Marzetta Board for a surgical debridement as I was unable to do anything in the office due to excruciating pain. At that stage she was referred from the  plastic surgery service to dermatology who treated her for a skin infection with doxycycline and then Levaquin and a local antibiotic ointment. I understand the patient has since developed ulceration on the left ankle both medial and lateral and was now referred back to the wound center as dermatology has finished the management. I do not have any notes from the dermatology department Old notes: 52 year old patient with a history of sickle cell anemia, pain bilateral lower extremities, right lower extremity ulcer and has a history of receiving a skin graft( Theraskin) several months ago. She has been visiting the wound center [facility] and was seen by Dr. Leanord Hawking and Dr. Marzetta Board. after prolonged conservator therapy between July 2016 and January 2017. She had been seen by the plastic surgeon and taken to the OR for debridement and application of Theraskin. She had 3 applications of Theraskin and was then treated with collagen. Prior to that she had a history of similar problems in 2014 and was treated conservatively. Had a reflux study done for the right lower extremity in August 2016 without reflux or DVT . Past medical history significant for sickle cell disease, anemia, leg ulcers, cholelithiasis,and has never been a smoker. Once the patient was discharged on the wound center she says within 2 or 3 weeks the problems recurred and she has been treating it conservatively. since I saw her 3 weeks ago at [facility] she has been unable to get her dressing material but has completed a course of doxycycline. 6/7/ 2017 -- lower extremity venous duplex reflux evaluation was done No evidence of SVT or DVT in the RLL. No venous incompetence in the RLL. No further vascular workup is indicated at this time. She was seen by Dr. Mina Marble, on 10/04/2015. She agreed with the plan of taking her to the OR for debridement and application of theraskin and would also take biopsies to rule out pyoderma  gangrenosum. Follow-up note dated May 31 received and she was status post application of Theraskin to multiple ulcers around the right ankle. Pathology did not show evidence of malignancy or pyoderma gangrenosum. She would continue to see as in the wound clinic for further care and see Dr. Marzetta Board as needed. The patient brought the biopsy report and it was consistent with stasis ulcer no evidence of malignancy and the comment was that there was some adjacent neovascularization, fibrosis and patchy perivascular chronic inflammation. 11/15/2015 -- today we applied her first application of Theraskin 11/30/15; TheraSkin #2 12/13/2015 -- she is having a lot of  pain locally and is here for possible application of a theraskin today. 01/16/2016 -- the patient has significant pain and has noticed despite in spite of all local care and oral pain medication. It is impossible to debride her in the office. 02/06/2016 -- I do not see any notes from Dr. Leta Baptist( the patient has not made a call to the office know as she heard from them) and the only visit to recently was with her PCP Dr. Gypsy Decant -- I saw her on 01/16/2016 and prescribed 90 tablets of oxycodone 10 mg and did lab work and screening for HIV. the HIV was negative and hemoglobin was 6.3 with a WBC count of 14.9 and hematocrit of 17.8 with platelets of 561. 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 The bimalleolar ulcers to the right lower extremity have been present for approximately 2 years, the left medial malleolus ulcer has been present approximately 6 months. She has followed with Dr.Thimmappa in the past and has had a total of 3 applications of Theraskin (01/2015, 09/2015, 06/17/16). She has also followed with Dr. Meyer Russel here in the clinic and has received 2 applications of TheraSkin (11/10/15, 11/30/15). The patient does  experience chronic, and is not amenable to debridement. 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 She is not currently on any antibiotic therapy and has not been treated with any recently. 07/17/2016 -- was seen by Dr. Leta Baptist of plastic surgery who saw her 2 weeks postop application of Theraskin #3. She had removed her dressing and asked her to apply silver alginate on alternate days and follow-up back with the wound center. Future debridements and application of skin substitute would have to be done in the hospital due to her high risk for anesthesia. READMISSION 04/17/2021 Patient is now a 52 year old woman that we have had in this clinic for a prolonged period of time and 2016-2017 and then again for 2 visits in February 2018. At that point she had wounds on the right lower leg predominantly medial. She had also been seen by plastic surgery Dr. Marzetta Board who I believe took her to the OR for operative debridement and application of TheraSkin in 2017. After she left our clinic she was followed for a very prolonged period of time in the wound care center in [facility] who then referred her ultimately to [facility] where she was seen by Dr. Mardene Speak. Again taken her to the OR for skin grafting which apparently did not take. She had multiple other attempts at dressings although I have not really looked over all of these notes in great detail. She has not been seen in a wound care center in about a year. She states over the last year in addition to her right lower leg she has developed wounds on the left lower leg quite extensive. She is using Xeroform to all of these wounds without really any improvement. She also has Medicaid which does not cover wound products. The patient has had vascular work-ups in the past including most recently on 03/28/2021 showing biphasic waveforms on the right triphasic at the PTA and biphasic at the dorsalis pedis on the left. She was  unable to tolerate any degree of compression to do ABIs. Unfortunately TBI's were also not done. She had venous reflux studies done in 2017. This did not show any evidence of a DVT or SVT  and no venous incompetence was noted in the right leg at the time this was the only side with the wound As noted I did not look all over her old records. She apparently had a course of HBO and Baptist although I am not sure what the indication would have been. In any case she developed seizures and terminated treatment earlier. She is generally much more disabled than when we last saw her in clinic. She can no longer walk pretty much wheelchair-bound because predominantly of pain in the left hip. 04/24/2021; the patient tolerated the wraps we put on. We used Santyl and Hydrofera Blue under compression. I brought her back for a nurse visit for a change in dressing. With Medicaid we will have a hard time getting anything paid for and hence the need for compression. She arrives in clinic with all the wounds looking somewhat better in terms of surface TIEGAN, KATAYAMA ([id number]) [id number] Page 4 of 17 12/20; circumferential wound on the right from the lateral to the medial. She has open areas on the left medial and left lateral x2 on all of this with the same surface. This does not look completely healthy although she does have some epithelialization. She is not complaining of a lot of pain which is unusual for her sickle ulcers. I have not looked over her extensive records from [facility]. She had recent arterial studies and has a history of venous reflux studies I will need to look these over although I do not believe she has significant arterial disease 2023 05/22/2021; patient's wound areas measure slightly smaller. Still a lot of drainage coming from the right we have been using Hydrofera Blue and Santyl with some improvement in the wound surfaces. She tells me she will be getting  transfused later in the week for her underlying sickle cell anemia I have looked over her recent arterial studies which were done in the fall. This was in November and showed biphasic and triphasic waveforms but she could not tolerate ABIs because of pressure and unfortunately TBI's were not done. She has not had recent venous reflux studies that I can see 1/10; not much change about the same surface area. This has a yellowish surface to it very gritty. We have been using Santyl and Hydrofera Blue for a prolonged period. Culture I did last week showed methicillin sensitive staph aureus "rare". Our intake nurse reports greenish drainage which may be the Hydrofera Blue itself 1/17; wounds are continue to measure smaller although I am not sure about the accuracy here. Especially the areas on the right are covered in what looks to be a nonviable surface although she does have some epithelialization. Similarly she has areas on the left medial and left lateral ankle area which appear to have a better surface and perhaps are slightly smaller. We have been using Santyl and Hydrofera Blue. She cannot tolerate mechanical debridements She went for her reflux studies which showed significant reflux at the greater saphenous vein at the saphenofemoral junction as well as the greater saphenous vein in the proximal calf on the left she had reflux in the thigh and the common femoral vein and supra vein Fishel vein reflux in the greater saphenous vein. I will have vein and vascular look at this. My thoughts have been that these are likely sickle wounds. I looked through her old records from [facility] wound care center and then when she graduated to [facility] wound care center where she saw Dr. Ronda Fairly and Dr.  Molnar. Although I can see she had reflux studies done I do not see that she actually saw a vein and vascular. I went over the fact that she had operative debridements and actual skin grafting that did not  take. I do not think these wounds have ever really progressed towards healing 1/31;Substantial wounds on the right ankle area. Hyper granulated very gritty adherent debris on the surface. 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 On the left she has no DVT or SVT deep vein reflux in the common femoral vein. Superficial vein reflux in the greater saphenous vein on although the vein diameter is not really all that large. I do not think there is anything that can be done with these although I am going to send her for consultation to vein and vascular. 2/7; Wound exam; substantial wound area on the right posterior ankle area and areas on the left medial ankle and left lateral ankle. I was able to debride the left medial ankle last week fairly aggressively and it is back this week to a completely nonviable surface She will see vascular surgery this Friday and I would like them to review the venous studies and also any comments on her arterial status. If they do not see an issue here I am going to refer her to plastic surgery for an operative debridement perhaps intraoperative ACell or Integra. Eventually she will require a deep tissue culture again 2/14; substantial wound area on the right posterior ankle, medial ankle. We have been using silver alginate The patient was seen by vein and vascular she had both venous reflux studies and arterial studies. 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 She had no evidence of superficial venous thrombosis. She did have superficial vein  reflux at the saphenofemoral junction and the greater saphenous vein. On the left no evidence of a DVT no evidence of superficial venous throat thrombosis she did have deep vein reflux in the common femoral vein and superficial vein reflux in the greater saphenous vein but these were not felt to be amenable to ablation. In terms of arterial studies she had triphasic and wife biphasic wave waveforms bilaterally not felt to have a significant arterial issue. I do not get the feeling that they felt that any part of her nonhealing wounds were related to either arterial or venous issues. They did note that she had venous reflux at the right at the [facility] and [facility]. And also on the left there were reflux in the deep system at the common femoral vein and greater saphenous vein in the proximal thigh. Nothing amenable to ablation. 2/20; she is making some decent progress on the right where there is nice skin between the 2 open areas on the right ankle. The surfaces here do not look viable yet there is some surrounding epithelialization. She still has a small area on the left medial ankle area. Hyper-granulated [name] 2/28 patient has an appointment with plastic surgery on 3/8. We will see her back on 3/9. She may have to  call us to get the area redressed. We've been using Santyl under silver alginate. We made a nice improvement on the left medial ankle. The larger wounds on the right also looks somewhat better in terms of epithelialization although I think they could benefit from an aggressive debridement if plastic surgery would be willing to do that. Perhaps placement of Integra or a cell 07/26/2021: She saw Dr. Arita Miss yesterday. He raised the question as to whether or not this might be pyoderma and wanted to wait until that question was answered by dermatology before proceeding with any sort of operative debridement. We have continue to use Santyl under silver alginate with Kerlix and Coban  wraps. Overall, her wounds appear to be continuing to contract and epithelialize, with some granulation tissue present. There continues to be some slough on all wound surfaces. 08/09/2021: She has not been able to get an appointment with dermatology because apparently the offices in [facility] and [place] do not accept Medicaid. She is looking into whether or not she can be seen at the main [facility] dermatology clinic. This is necessary because plastic surgery is concerned that her wounds might represent pyoderma and they did not want to do any procedure until that was clarified. We have been using Santyl under silver alginate with Kerlix and Coban wraps. Today, there was a greater amount of drainage on her dressings with a slight green discoloration and significant odor. Despite this, her wounds continue to contract and epithelialize. There is pale granulation tissue present and actually, on the left medial ankle, the granulation tissue is a bit hypertrophic. 08/16/2021: Last week, I took a culture and this grew back rare methicillin-resistant Staph aureus and rare corynebacterium. The MRSA was sensitive to gentamicin which we began applying topically on an empiric basis. This week, her wounds are a bit smaller and the drainage and odor are less. Her primary care provider is working on assisting the patient with a dermatology evaluation. She has been in silver alginate over the gentamicin that was started last week along with Kerlix and Coban wraps. 08/23/2021: Because she has Medicaid, we have been unable to get her into see any dermatologist in the [place] to rule out pyoderma gangrenosum, which was a requirement from plastic surgery prior to any sort of debridement and grafting. Despite this, however, all of her wounds continue to get smaller. The wound on her left medial ankle is nearly closed. There is no odor from the wounds, although she still accumulates a modest amount of drainage on  her dressings. 08/30/2021: The lateral right ankle wound and the medial left ankle wound are a bit smaller today. The medial right ankle wound is about the same size. They are less tender. We have still been unable to get her into dermatology. 09/06/2021: All of the wounds are about the same size today. She continues to endorse minimal pain. I communicated with Dr. Arita Miss in plastic surgery regarding our issues getting a dermatology appointment; he was out of town but indicated that he would look into perhaps performing the biopsy in his office and will have his office contact her. [name], [name] ([id number]) [id number] Page 5 of 17 09/14/2021: The patient has an appointment in dermatology, but it is not until October. Her wounds are roughly the same; she continues to have very thick purulent-looking drainage on her dressings. 09/20/2021: The left medial wound is nearly closed and just has a bit of accumulated eschar on the surface. The right medial and lateral ankle  wounds are perhaps a little bit smaller. They continue to have a very pale surface with accumulation of thin slough. 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 I did not think Jodie Echevaria was indicated based on this. She is getting topical mupirocin with Prisma silver collagen. 10/04/2021: The patient was not seen in clinic last week due to childcare coverage issues. In the interim, the left medial leg wound has closed. The right sided leg wounds are smaller. There is more granulation tissue coming through, particularly on the lateral wound. The surface remains somewhat gritty. We have been applying topical mupirocin and Prisma silver collagen. 10/11/2021: The left medial leg wound remains closed. She does complain of some anesthetic sensation to the area. Both of the right-sided leg wounds are smaller but still have accumulated slough. 10/18/2021: Both right-sided leg wounds are minimally  smaller this week. She still continues to accumulate slough and has thick drainage on her dressings. 10/23/2021: Both wounds continue to contract. There is still slough buildup. She has been approved for a keratin-based skin substitute trial product but it will not be available until next week. 10/30/2021: The wounds are about the same to perhaps slightly smaller. There is still continued slough buildup. Unfortunately, the rep for the keratin based product did not show up today and did not answer his phone when called. 11/08/2021: The wounds are little bit smaller today. She continues to have thick drainage but the surfaces are relatively clean with just a little bit of slough accumulation. She reported to me today that she is unable to completely flex her left ankle and on examination it seems this is potentially related to scar tissue from her wounds. We do have the ProgenaMatrix trial product available for her today. 11/15/2021: Both wounds are smaller today. There is some slough accumulation on the surfaces, but the medial wound, in particular looks like it is filling in and is less deep. She did hear from physical therapy and she is going to start working with them on July 11. She is here for her second application of the trial skin substitute, ProgenaMatrix. 11/22/2021: Both wounds continue to contract, the medial more dramatically than the lateral. Both wounds have a layer of slough on the surface, but underneath this, the gritty fibrous tissue has a little bit more of a pink cast to it rather than being as pale as it has been. 11/29/2021: The wounds are roughly the same size this week, perhaps a millimeter or 2 smaller. The medial wound has filled in and is nearly flush with the surrounding skin surface. She continues to have a lot of slough accumulation on both surfaces. 12/06/2021: No significant change to her wounds, but she has a new opening on her dorsal foot, just distal to the right lateral  ankle wound. The area on her left medial ankle that reopened looks a little bit larger today. She has quite a bit of pain associated with the new wound. 12/12/2021: Her wounds look about the same but the new opening on her right lateral dorsal foot is a little bit bigger. She continues to have a fair amount of pain with this wound. 12/26/2021: The left medial ankle wound is tiny and superficial. She has 2 areas of crusting on her left lateral ankle, however, that appear to be threatening to open again. Her right medial ankle wound is a little bit smaller today but still continues to accumulate thick rubbery slough. The new dorsal foot wound is exquisitely  painful but there is no odor or purulent drainage. No erythema or induration. The right lateral ankle wound looks about the same today, again with thick rubbery slough. 01/03/2022: The left medial ankle wound has closed again. Both right ankle wounds appear to be about the same size with thick rubbery slough. The dorsal foot wound on the right continues to be quite painful and she stated that she did not want any debridement of that site today. 01/10/2022: No real change to any of her wounds. She continues to accumulate thick slough. The dorsal foot wound has merged with the lateral malleolar wound. She is experiencing significant pain in the dorsal foot portion of the ulcer. 01/16/2022: Absolutely no change or progress in her wounds. 01/24/2022: Her wounds are unchanged. She continues to build up slough and the wounds on her dorsal right foot are still exquisitely tender. 01/31/2022: The wounds actually measure a little bit narrower today. They still have thick slough on the surface but the underlying tissue seems a little less fibrotic. We changed to Iodosorb last week. 02/07/2022: Wounds continue to slowly epithelialize around the parameters. She has less pain today. She still accumulates a fairly substantial layer of slough. 02/15/2022: No change to the  wounds overall. The measured a little bit larger today per the intake nurse. She continues to have substantial slough accumulation and her pain is a little bit worse. 02/21/2022. No change at all to her wounds. I do not see that plastic surgery has received her referral yet. 02/28/2022: The wounds remain unchanged. They are dry and fibrotic with accumulation of slough and eschar. She did receive an appointment to see plastic surgery on October 23, but the office called her back and indicated they needed to reschedule and that the next available appointment was not until December. The patient became angry and decided she did not want to see this plastics group. 10/19; the patient sees Dr. Maylene Roes again next week. She is using Medihoney as the primary dressing changing this herself. 03/21/2022: She saw Dr. Ferd Hibbs at the wound care center at [facility]. Dr. Ferd Hibbs was also unable to debride her, secondary to pain and is planning to take her to the operating room for operative debridement and potential skin substitute placement. Her wounds are unchanged with thick yellow slough and a fibrotic base. She says they are too painful to be debrided. In addition, yesterday her hemoglobin was 4 and she received 2 units packed red blood cell transfusion. 04/04/2022: Her operation at [facility] is scheduled for December 15. She continues to use Medihoney on her wounds. They are a little bit less painful today and she is willing to entertain the possibility of debridement. The wounds measured a little bit larger in all dimensions today but the layer of slough is not as thick as usual. 04/18/2022: Her wounds actually measured a little bit smaller today and the extension onto the dorsal foot from the lateral wound has healed. They are less tender and there is significantly less slough present as compared to prior visits. 05/09/2022: She had to reschedule her surgery due to lack of childcare.  It is now planned for January 5. The wounds are basically unchanged, but she had a lot more drainage, which was thick and somewhat purulent in appearance. No significant odor. 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 They are quite painful today and she does not want to have debridement performed. 05/30/2022: The patient underwent surgical debridement and placement of TheraSkin  to her wounds on January 5. She has been doing well and does not endorse any pain. The TheraSkin is intact and looks beautiful. It is sutured in place. There is no exudate or significant drainage. [name], [name] ([id number]) [id number] Page 6 of 17 06/04/2022: Her TheraSkin remains intact and I am starting to see granulation tissue emerging underneath the surface. No concern for infection. 06/12/2022: The TheraSkin is intact and there is more granulation tissue emerging. It is starting to look a little bit dry, however. No malodor or purulent drainage. 06/19/2022: The TheraSkin continues to look good. The edges are a bit dry, but the central portion of each of her wounds is showing a nice pink color. 06/27/2022: The TheraSkin on the lateral wound remains almost completely intact. There is nice pink tissue peaking through the mesh of the TheraSkin. On the medial leg, it is starting to breakdown a little bit, but most of the TheraSkin remains intact here, as well. Both wounds measured smaller today. 07/03/2022: The wounds are fairly dry around the perimeter today. Some of the suture is hanging loose. 07/12/2022: Both wounds are measuring smaller today. The medial ulcer is getting a bit too dry. The lateral ulcer continues to have nice buds of granulation tissue emerging. 07/26/2022: The moisture balance on both wounds is much better today. For the first time since I began seeing her, I see actual beefy red granulation tissue on the lateral ankle wound. There are more buds of deep pink granulation  tissue emerging on the medial ankle. There is some dry eschar around the edges of the medial wound and a tiny amount of slough overlying the good granulation tissue on the lateral wound. 08/09/2022: Unfortunately, she has a new wound on her left lateral lower leg, just above the ankle. It is extremely painful for her. It penetrates to the fat layer and has the same woody, fibrotic character as her previous wounds. The other wounds are both a little bit smaller with slough and some eschar around the periphery. 08/14/2022: Her right lateral ankle wound is a little bit smaller. Unfortunately, she has had an increase in her drainage and it has a purulent nature to it, similar to what was present prior to her surgical debridement. She also reports odor coming from her wounds this week. The left lateral leg wound remains extremely tender. 08/21/2022: The left lateral ankle wound is larger and more painful today. Both right ankle wounds have a little bit more vital appearance, but they have accumulated more slough. She reports less odor this week. She is currently taking Augmentin. 08/28/2022: The left lateral ankle wound continues to enlarge and remains quite painful. Both of the right ankle wounds actually look better this week. There is improved tissue quality with less slough accumulation. 09/27/2022: The left lateral ankle wound actually looks nearly healed. She says that she has been applying some silver alginate that she had leftover. She was unable to afford the Santyl that was prescribed, but found a tube that she had on hand at home and has been using this on her right ankle wounds. These wounds look about the same and have accumulated the usual layer of slough. Unfortunately, a portion of the dorsal foot wound has reopened and is exquisitely painful today. 10/09/2022: The left lateral ankle wound is healed. The dorsal foot wound appears to have expanded and remains exquisitely painful. The medial and  lateral right ankle wounds look about the same size, but they are quite a bit cleaner. 10/23/2022:  The left lateral ankle remains closed. The dorsal foot wound has expanded to the point that it now converges with the right lateral leg wound. She has accumulated fairly thick slough on all of the open surfaces. 11/06/2022: No significant change to any of her wounds, although the dorsal foot aspect of the right lateral wound is a little bit smaller. She has been approved for the [employer] and [employer] for [employer] and will be able to get a full year's supply. 11/20/2022: The dorsal foot aspect of the right lateral wound has almost completely closed. I am seeing nice pink tissue starting to emerge from the fibrotic surface of both wounds. The medial leg wound appears to be a little bit smaller today. She has been using Santyl. 12/04/2022: The dorsal foot aspect of the right lateral wound has closed. There is more pink granulation tissue emerging on her wounds. There are buds of epithelium beginning to fill in from the edges of her wound, particularly at the proximal aspect of the medial wound and the distal aspect of the lateral wound. Significantly less slough formation than in the past. 12/18/2022: The wounds measured about the same size today, but there is better granulation tissue filling in and the overall surface consistency is improving. She does have slough accumulation on the surfaces, but no malodor or purulent drainage. Electronic Signature(s) Signed: 12/18/2022 9:43:27 AM By: Duanne Guess MD FACS Entered By: Duanne Guess on 12/18/2022 09:43:27 -------------------------------------------------------------------------------- Physical Exam Details Patient Name: Date of Service: [name], [name] 12/18/2022 8:30 A M Medical Record Number: [id number] Patient Account Number: [id number] Date of Birth/Sex: Treating RN: [birth year] (52 y.o. F) Primary Care Provider: Julianne Handler  Other Clinician: Referring Provider: Treating Provider/Extender: Koleen Nimrod Weeks in Treatment: 4 Constitutional Slightly hypertensive. . . . no acute distress. Respiratory Normal work of breathing on room air. [name], [name] ([id number]) [id number] Page 7 of 17 Notes 12/18/2022: The wounds measured about the same size today, but there is better granulation tissue filling in and the overall surface consistency is improving. She does have slough accumulation on the surfaces, but no malodor or purulent drainage. Electronic Signature(s) Signed: 12/18/2022 9:44:47 AM By: Duanne Guess MD FACS Entered By: Duanne Guess on 12/18/2022 09:44:47 -------------------------------------------------------------------------------- Physician Orders Details Patient Name: Date of Service: [name], [name] 12/18/2022 8:30 A M Medical Record Number: [id number] Patient Account Number: [id number] Date of Birth/Sex: Treating RN: 1970/06/27 (52 y.o. 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 - Dr. Lady Gary Rm 2 Return A Anesthetic (In clinic) Topical Lidocaine 4% applied to wound bed Bathing/ Shower/ Hygiene May shower and wash wound with soap and water. Edema Control - Lymphedema / SCD / Other Elevate legs to the level of the heart or above for 30 minutes daily and/or when sitting for 3-4 times a day throughout the day. Avoid standing for long periods of time. Exercise regularly Additional Orders / Instructions Follow Nutritious Diet Juven Shake 1-2  times daily. Wound Treatment Wound #17 - Lower Leg Wound Laterality: Right, Lateral Cleanser: Soap and Water 1 x Per Week/30 Days Discharge Instructions: May shower and wash wound with dial antibacterial soap and water prior to dressing change.  Cleanser: Wound Cleanser 1 x Per Week/30 Days Discharge Instructions: Cleanse the wound with wound cleanser prior to applying a clean dressing using gauze sponges, not tissue or cotton balls. Peri-Wound Care: Triamcinolone 15 (g) 1 x Per Week/30 Days Discharge Instructions: Use triamcinolone 15 (g) as directed Peri-Wound Care: Sween Lotion (Moisturizing lotion) 1 x Per Week/30 Days Discharge Instructions: Apply moisturizing lotion as directed Prim Dressing: Santyl Ointment 1 x Per Week/30 Days ary Discharge Instructions: Apply nickel thick amount to wound bed as instructed Secondary Dressing: ABD Pad, 5x9 1 x Per Week/30 Days Discharge Instructions: Apply over primary dressing as directed. Secondary Dressing: Woven Gauze Sponge, Non-Sterile 4x4 in 1 x Per Week/30 Days Discharge Instructions: Moisten with normal saline and Apply over primary dressing as directed. Secured With: Elastic Bandage 4 inch (ACE bandage) 1 x Per Week/30 Days Discharge Instructions: Secure with ACE bandage as directed. SHARIFAH, DEGON ([id number]) [id number] Page 8 of 17 Secured With: [employer], 4.5x3.1 (in/yd) 1 x Per Week/30 Days Discharge Instructions: Secure with Kerlix as directed. Wound #21 - Ankle Wound Laterality: Right, Medial Cleanser: Soap and Water 1 x Per Week/30 Days Discharge Instructions: May shower and wash wound with dial antibacterial soap and water prior to dressing change. Cleanser: Wound Cleanser 1 x Per Week/30 Days Discharge Instructions: Cleanse the wound with wound cleanser prior to applying a clean dressing using gauze sponges, not tissue or cotton balls. Peri-Wound Care: Triamcinolone 15 (g) 1 x Per Week/30  Days Discharge Instructions: Use triamcinolone 15 (g) as directed Peri-Wound Care: Sween Lotion (Moisturizing lotion) 1 x Per Week/30 Days Discharge Instructions: Apply moisturizing lotion as directed Prim Dressing: Santyl Ointment 1 x Per Week/30 Days ary Discharge Instructions: Apply nickel thick amount to wound bed as instructed Secondary Dressing: ABD Pad, 5x9 1 x Per Week/30 Days Discharge Instructions: Apply over primary dressing as directed. Secondary Dressing: Woven Gauze Sponge, Non-Sterile 4x4 in 1 x Per Week/30 Days Discharge Instructions: Moisten with normal saline and Apply over primary dressing as directed. Secured With: Elastic Bandage 4 inch (ACE bandage) 1 x Per Week/30 Days Discharge Instructions: Secure with ACE bandage as directed. Secured With: [employer], 4.5x3.1 (in/yd) 1 x Per Week/30 Days Discharge Instructions: Secure with Kerlix as directed. Patient Medications llergies: No Known Allergies A Notifications Medication Indication Start End 12/18/2022 lidocaine DOSE topical 4 % cream - cream topical Electronic Signature(s) Signed: 12/18/2022 12:15:41 PM By: Duanne Guess MD FACS Entered By: Duanne Guess on 12/18/2022 09:45:00 -------------------------------------------------------------------------------- Problem List Details Patient Name: Date of Service: [name], [name] 12/18/2022 8:30 A M Medical Record Number: [id number] Patient Account Number: [id number] Date of Birth/Sex: Treating RN: Oct 18, 1970 (52 y.o. 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 04/17/2021 No Yes RISSIE, PERRIN  ([id number]) [id number] Page 9 of 17 Inactive Problems ICD-10 Code Description Active Date Inactive Date L97.828 Non-pressure chronic ulcer of other part of left lower leg with other specified severity 04/17/2021 04/17/2021 Resolved Problems ICD-10 Code Description Active Date Resolved Date L97.822 Non-pressure chronic ulcer of other part of left lower leg with fat layer exposed 08/09/2022 08/09/2022 Electronic Signature(s) Signed: 12/18/2022 9:41:14 AM By: Duanne Guess MD FACS Entered By: Duanne Guess on 12/18/2022 09:41:14 -------------------------------------------------------------------------------- Progress Note Details Patient Name:  Date of Service: Doneen Poisson, [place] [name] 12/18/2022 8:30 A M Medical Record Number: [id number] Patient Account Number: [id number] Date of Birth/Sex: Treating RN: 1971/03/31 (52 y.o. F) Primary Care Provider: Julianne Handler Other Clinician: Referring Provider: Treating Provider/Extender: Koleen Nimrod Weeks in Treatment: 29 Subjective Chief Complaint Information obtained from Patient the patient is here for evaluation of her bilateral lower extremity sickle cell ulcers 04/17/2021; patient comes in for substantial wounds on the right and left lower leg History of Present Illness (HPI) The following HPI elements were documented for the patient's wound: Location: medial and lateral ankle region on the right and left medial malleolus Quality: Patient reports experiencing a shooting pain to affected area(s). Severity: Patient states wound(s) are getting worse. Duration: right lower extremity bimalleolar ulcers have been present for approximately 2 years; the right medial malleolus ulcer has been there proximally 6 months Timing: Pain in wound is constant (hurts all the time) Context: The wound would happen gradually Associated Signs and Symptoms: Patient reports having increase discharge. 52 year old  patient with a history of sickle cell anemia who was last seen by me with ulceration of the right lower extremity above the ankle and was referred to Dr. Marzetta Board for a surgical debridement as I was unable to do anything in the office due to excruciating pain. At that stage she was referred from the plastic surgery service to dermatology who treated her for a skin infection with doxycycline and then Levaquin and a local antibiotic ointment. I understand the patient has since developed ulceration on the left ankle both medial and lateral and was now referred back to the wound center as dermatology has finished the management. I do not have any notes from the dermatology department Old notes: 52 year old patient with a history of sickle cell anemia, pain bilateral lower extremities, right lower extremity ulcer and has a history of receiving a skin graft( Theraskin) several months ago. She has been visiting the wound center [facility] and was seen by Dr. Leanord Hawking and Dr. Marzetta Board. after prolonged conservator therapy between July 2016 and January 2017. She had been seen by the plastic surgeon and taken to the OR for debridement and application of Theraskin. She had 3 applications of Theraskin and was then treated with collagen. Prior to that she had a history of similar problems in 2014 and was treated conservatively. Had a reflux study done for the right lower extremity in August 2016 without reflux or DVT . Past medical history significant for sickle cell disease, anemia, leg ulcers, cholelithiasis,and has never been a smoker. Once the patient was discharged on the wound center she says within 2 or 3 weeks the problems recurred and she has been treating it conservatively. since I saw her 3 weeks ago at [facility] she has been unable to get her dressing material but has completed a course of doxycycline. 6/7/ 2017 -- lower extremity venous duplex reflux evaluation was done  No evidence of SVT or DVT in  the RLL. No venous incompetence in the RLL. No further vascular workup is indicated at this time. She was seen by Dr. Mina Marble, on 10/04/2015. She agreed with the plan of taking her to the OR for debridement and application of theraskin and would also take biopsies to rule out pyoderma gangrenosum. BANELLY, ROSENBURG ([id number]) [id number] Page 10 of 17 Follow-up note dated May 31 received and she was status post application of Theraskin to multiple ulcers around the right ankle. Pathology did not show evidence of malignancy  or pyoderma gangrenosum. She would continue to see as in the wound clinic for further care and see Dr. Marzetta Board as needed. The patient brought the biopsy report and it was consistent with stasis ulcer no evidence of malignancy and the comment was that there was some adjacent neovascularization, fibrosis and patchy perivascular chronic inflammation. 11/15/2015 -- today we applied her first application of Theraskin 11/30/15; TheraSkin #2 12/13/2015 -- she is having a lot of pain locally and is here for possible application of a theraskin today. 01/16/2016 -- the patient has significant pain and has noticed despite in spite of all local care and oral pain medication. It is impossible to debride her in the office. 02/06/2016 -- I do not see any notes from Dr. Leta Baptist( the patient has not made a call to the office know as she heard from them) and the only visit to recently was with her PCP Dr. Gypsy Decant -- I saw her on 01/16/2016 and prescribed 90 tablets of oxycodone 10 mg and did lab work and screening for HIV. the HIV was negative and hemoglobin was 6.3 with a WBC count of 14.9 and hematocrit of 17.8 with platelets of 561. 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 The bimalleolar ulcers to the right lower extremity have been  present for approximately 2 years, the left medial malleolus ulcer has been present approximately 6 months. She has followed with Dr.Thimmappa in the past and has had a total of 3 applications of Theraskin (01/2015, 09/2015, 06/17/16). She has also followed with Dr. Meyer Russel here in the clinic and has received 2 applications of TheraSkin (11/10/15, 11/30/15). The patient does experience chronic, and is not amenable to debridement. 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 She is not currently on any antibiotic therapy and has not been treated with any recently. 07/17/2016 -- was seen by Dr. Leta Baptist of plastic surgery who saw her 2 weeks postop application of Theraskin #3. She had removed her dressing and asked her to apply silver alginate on alternate days and follow-up back with the wound center. Future debridements and application of skin substitute would have to be done in the hospital due to her high risk for anesthesia. READMISSION 04/17/2021 Patient is now a 52 year old woman that we have had in this clinic for a prolonged period of time and 2016-2017 and then again for 2 visits in February 2018. At that point she had wounds on the right lower leg predominantly medial. She had also been seen by plastic surgery Dr. Marzetta Board who I believe took her to the OR for operative debridement and application of TheraSkin in 2017. After she left our clinic she was followed for a very prolonged period of time in the wound care center in [facility] who then referred her ultimately to [facility] where she was seen by Dr. Mardene Speak. Again taken her to the OR for skin grafting which apparently did not take. She had multiple other attempts at dressings although I have not really looked over all of these notes in great detail. She has not been seen in a wound care center in about a year. She states over the last year in addition to her right lower leg she has developed wounds on the  left lower leg quite extensive. She is using Xeroform to all of these wounds without really any improvement. She also has Medicaid  which does not cover wound products. The patient has had vascular work-ups in the past including most recently on 03/28/2021 showing biphasic waveforms on the right triphasic at the PTA and biphasic at the dorsalis pedis on the left. She was unable to tolerate any degree of compression to do ABIs. Unfortunately TBI's were also not done. She had venous reflux studies done in 2017. This did not show any evidence of a DVT or SVT and no venous incompetence was noted in the right leg at the time this was the only side with the wound As noted I did not look all over her old records. She apparently had a course of HBO and [facility] although I am not sure what the indication would have been. In any case she developed seizures and terminated treatment earlier. She is generally much more disabled than when we last saw her in clinic. She can no longer walk pretty much wheelchair-bound because predominantly of pain in the left hip. 04/24/2021; the patient tolerated the wraps we put on. We used Santyl and Hydrofera Blue under compression. I brought her back for a nurse visit for a change in dressing. With Medicaid we will have a hard time getting anything paid for and hence the need for compression. She arrives in clinic with all the wounds looking somewhat better in terms of surface 12/20; circumferential wound on the right from the lateral to the medial. She has open areas on the left medial and left lateral x2 on all of this with the same surface. This does not look completely healthy although she does have some epithelialization. She is not complaining of a lot of pain which is unusual for her sickle ulcers. I have not looked over her extensive records from [facility]. She had recent arterial studies and has a history of venous reflux studies I will need to look these over although I do  not believe she has significant arterial disease 2023 05/22/2021; patient's wound areas measure slightly smaller. Still a lot of drainage coming from the right we have been using Hydrofera Blue and Santyl with some improvement in the wound surfaces. She tells me she will be getting transfused later in the week for her underlying sickle cell anemia I have looked over her recent arterial studies which were done in the fall. This was in November and showed biphasic and triphasic waveforms but she could not tolerate ABIs because of pressure and unfortunately TBI's were not done. She has not had recent venous reflux studies that I can see 1/10; not much change about the same surface area. This has a yellowish surface to it very gritty. We have been using Santyl and Hydrofera Blue for a prolonged period. Culture I did last week showed methicillin sensitive staph aureus "rare". Our intake nurse reports greenish drainage which may be the Hydrofera Blue itself 1/17; wounds are continue to measure smaller although I am not sure about the accuracy here. Especially the areas on the right are covered in what looks to be a nonviable surface although she does have some epithelialization. Similarly she has areas on the left medial and left lateral ankle area which appear to have a better surface and perhaps are slightly smaller. We have been using Santyl and Hydrofera Blue. She cannot tolerate mechanical debridements She went for her reflux studies which showed significant reflux at the greater saphenous vein at the saphenofemoral junction as well as the greater saphenous vein in the proximal calf on the left she had reflux in  the thigh and the common femoral vein and supra vein Fishel vein reflux in the greater saphenous vein. I will have vein and vascular look at this. My thoughts have been that these are likely sickle wounds. I looked through her old records from [facility] wound care center and then when she  graduated to [employer] [facility] wound care center where she saw Dr. Ronda Fairly and Dr. Mardene Speak. Although I can see she had reflux studies done I do not see that she actually saw a vein and vascular. I went over the fact that she had operative debridements and actual skin grafting that did not take. I do not think these wounds have ever really progressed towards healing 1/31;Substantial wounds on the right ankle area. Hyper granulated very gritty adherent debris on the surface. 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 On the left she has no DVT or SVT deep vein reflux in the common femoral vein. Superficial vein reflux in the greater saphenous vein on although the vein diameter is not really all that large. I do not think there is anything that can be done with these although I am going to send her for consultation to vein and vascular. 2/7; Wound exam; substantial wound area on the right posterior ankle area and areas on the left medial ankle and left lateral ankle. I was able to debride the left medial ankle last week fairly aggressively and it is back this week to a completely nonviable surface She will see vascular surgery this Friday and I would like them to review the venous studies and also any comments on her arterial status. If they do not see an issue here I am going to refer her to plastic surgery for an operative debridement perhaps intraoperative ACell or Integra. Eventually she will require a deep [name], [name] ([id number]) [id number] Page 11 of 17 tissue culture again 2/14; substantial wound area on the  right posterior ankle, medial ankle. We have been using silver alginate The patient was seen by vein and vascular she had both venous reflux studies and arterial studies. 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 She had no evidence of superficial venous thrombosis. She did have superficial vein reflux at the saphenofemoral junction and the greater saphenous vein. On the left no evidence of a DVT no evidence of superficial venous throat thrombosis she did have deep vein reflux in the common femoral vein and superficial vein reflux in the greater saphenous vein but these were not felt to be amenable to ablation. In terms of arterial studies she had triphasic and wife biphasic wave waveforms bilaterally not felt to have a significant arterial issue. I do not get the feeling that they felt that any part of her nonhealing wounds were related to either arterial or venous issues. They did note that she had venous reflux at the right at the [facility] and [facility]. And also on the left there were reflux in the deep system at the common femoral vein and greater saphenous vein in the proximal thigh. Nothing amenable  to ablation. 2/20; she is making some decent progress on the right where there is nice skin between the 2 open areas on the right ankle. The surfaces here do not look viable yet there is some surrounding epithelialization. She still has a small area on the left medial ankle area. Hyper-granulated [name] 2/28 patient has an appointment with plastic surgery on 3/8. We will see her back on 3/9. She may have to call us to get the area redressed. We've been using Santyl under silver alginate. We made a nice improvement on the left medial ankle. The larger wounds on the right also looks somewhat better in terms of epithelialization although I think they could benefit from an aggressive debridement if plastic surgery would be  willing to do that. Perhaps placement of Integra or a cell 07/26/2021: She saw Dr. Arita Miss yesterday. He raised the question as to whether or not this might be pyoderma and wanted to wait until that question was answered by dermatology before proceeding with any sort of operative debridement. We have continue to use Santyl under silver alginate with Kerlix and Coban wraps. Overall, her wounds appear to be continuing to contract and epithelialize, with some granulation tissue present. There continues to be some slough on all wound surfaces. 08/09/2021: She has not been able to get an appointment with dermatology because apparently the offices in [facility] and [place] do not accept Medicaid. She is looking into whether or not she can be seen at the main [facility] dermatology clinic. This is necessary because plastic surgery is concerned that her wounds might represent pyoderma and they did not want to do any procedure until that was clarified. We have been using Santyl under silver alginate with Kerlix and Coban wraps. Today, there was a greater amount of drainage on her dressings with a slight green discoloration and significant odor. Despite this, her wounds continue to contract and epithelialize. There is pale granulation tissue present and actually, on the left medial ankle, the granulation tissue is a bit hypertrophic. 08/16/2021: Last week, I took a culture and this grew back rare methicillin-resistant Staph aureus and rare corynebacterium. The MRSA was sensitive to gentamicin which we began applying topically on an empiric basis. This week, her wounds are a bit smaller and the drainage and odor are less. Her primary care provider is working on assisting the patient with a dermatology evaluation. She has been in silver alginate over the gentamicin that was started last week along with Kerlix and Coban wraps. 08/23/2021: Because she has Medicaid, we have been unable to get her into see any  dermatologist in the [place] to rule out pyoderma gangrenosum, which was a requirement from plastic surgery prior to any sort of debridement and grafting. Despite this, however, all of her wounds continue to get smaller. The wound on her left medial ankle is nearly closed. There is no odor from the wounds, although she still accumulates a modest amount of drainage on her dressings. 08/30/2021: The lateral right ankle wound and the medial left ankle wound are a bit smaller today. The medial right ankle wound is about the same size. They are less tender. We have still been unable to get her into dermatology. 09/06/2021: All of the wounds are about the same size today. She continues to endorse minimal pain. I communicated with Dr. Arita Miss in plastic surgery regarding our issues getting a dermatology appointment; he was out of town but indicated that he would look into perhaps performing  the biopsy in his office and will have his office contact her. 09/14/2021: The patient has an appointment in dermatology, but it is not until October. Her wounds are roughly the same; she continues to have very thick purulent-looking drainage on her dressings. 09/20/2021: The left medial wound is nearly closed and just has a bit of accumulated eschar on the surface. The right medial and lateral ankle wounds are perhaps a little bit smaller. They continue to have a very pale surface with accumulation of thin slough. 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 I did not think Jodie Echevaria was indicated based on this. She is getting topical mupirocin with Prisma silver collagen. 10/04/2021: The patient was not seen in clinic last week due to childcare coverage issues. In the interim, the left medial leg wound has closed. The right sided leg wounds are smaller. There is more granulation tissue coming through, particularly on the lateral wound. The surface remains somewhat gritty. We have been applying topical  mupirocin and Prisma silver collagen. 10/11/2021: The left medial leg wound remains closed. She does complain of some anesthetic sensation to the area. Both of the right-sided leg wounds are smaller but still have accumulated slough. 10/18/2021: Both right-sided leg wounds are minimally smaller this week. She still continues to accumulate slough and has thick drainage on her dressings. 10/23/2021: Both wounds continue to contract. There is still slough buildup. She has been approved for a keratin-based skin substitute trial product but it will not be available until next week. 10/30/2021: The wounds are about the same to perhaps slightly smaller. There is still continued slough buildup. Unfortunately, the rep for the keratin based product did not show up today and did not answer his phone when called. 11/08/2021: The wounds are little bit smaller today. She continues to have thick drainage but the surfaces are relatively clean with just a little bit of slough accumulation. She reported to me today that she is unable to completely flex her left ankle and on examination it seems this is potentially related to scar tissue from her wounds. We do have the ProgenaMatrix trial product available for her today. 11/15/2021: Both wounds are smaller today. There is some slough accumulation on the surfaces, but the medial wound, in particular looks like it is filling in and is less deep. She did hear from physical therapy and she is going to start working with them on July 11. She is here for her second application of the trial skin substitute, ProgenaMatrix. 11/22/2021: Both wounds continue to contract, the medial more dramatically than the lateral. Both wounds have a layer of slough on the surface, but underneath this, the gritty fibrous tissue has a little bit more of a pink cast to it rather than being as pale as it has been. 11/29/2021: The wounds are roughly the same size this week, perhaps a millimeter or 2 smaller.  The medial wound has filled in and is nearly flush with the surrounding skin surface. She continues to have a lot of slough accumulation on both surfaces. 12/06/2021: No significant change to her wounds, but she has a new opening on her dorsal foot, just distal to the right lateral ankle wound. The area on her left medial ankle that reopened looks a little bit larger today. She has quite a bit of pain associated with the new wound. 12/12/2021: Her wounds look about the same but the new opening on her right lateral dorsal foot is a little bit bigger.  She continues to have a fair amount of pain with this wound. 12/26/2021: The left medial ankle wound is tiny and superficial. She has 2 areas of crusting on her left lateral ankle, however, that appear to be threatening to [name], [name] ([id number]) [id number] Page 12 of 17 open again. Her right medial ankle wound is a little bit smaller today but still continues to accumulate thick rubbery slough. The new dorsal foot wound is exquisitely painful but there is no odor or purulent drainage. No erythema or induration. The right lateral ankle wound looks about the same today, again with thick rubbery slough. 01/03/2022: The left medial ankle wound has closed again. Both right ankle wounds appear to be about the same size with thick rubbery slough. The dorsal foot wound on the right continues to be quite painful and she stated that she did not want any debridement of that site today. 01/10/2022: No real change to any of her wounds. She continues to accumulate thick slough. The dorsal foot wound has merged with the lateral malleolar wound. She is experiencing significant pain in the dorsal foot portion of the ulcer. 01/16/2022: Absolutely no change or progress in her wounds. 01/24/2022: Her wounds are unchanged. She continues to build up slough and the wounds on her dorsal right foot are still exquisitely tender. 01/31/2022: The wounds  actually measure a little bit narrower today. They still have thick slough on the surface but the underlying tissue seems a little less fibrotic. We changed to Iodosorb last week. 02/07/2022: Wounds continue to slowly epithelialize around the parameters. She has less pain today. She still accumulates a fairly substantial layer of slough. 02/15/2022: No change to the wounds overall. The measured a little bit larger today per the intake nurse. She continues to have substantial slough accumulation and her pain is a little bit worse. 02/21/2022. No change at all to her wounds. I do not see that plastic surgery has received her referral yet. 02/28/2022: The wounds remain unchanged. They are dry and fibrotic with accumulation of slough and eschar. She did receive an appointment to see plastic surgery on October 23, but the office called her back and indicated they needed to reschedule and that the next available appointment was not until December. The patient became angry and decided she did not want to see this plastics group. 10/19; the patient sees Dr. Maylene Roes again next week. She is using Medihoney as the primary dressing changing this herself. 03/21/2022: She saw Dr. Ferd Hibbs at the wound care center at [facility]. Dr. Ferd Hibbs was also unable to debride her, secondary to pain and is planning to take her to the operating room for operative debridement and potential skin substitute placement. Her wounds are unchanged with thick yellow slough and a fibrotic base. She says they are too painful to be debrided. In addition, yesterday her hemoglobin was 4 and she received 2 units packed red blood cell transfusion. 04/04/2022: Her operation at [facility] is scheduled for December 15. She continues to use Medihoney on her wounds. They are a little bit less painful today and she is willing to entertain the possibility of debridement. The wounds measured a little bit larger in all dimensions  today but the layer of slough is not as thick as usual. 04/18/2022: Her wounds actually measured a little bit smaller today and the extension onto the dorsal foot from the lateral wound has healed. They are less tender and there is significantly less slough present as compared to  prior visits. 05/09/2022: She had to reschedule her surgery due to lack of childcare. It is now planned for January 5. The wounds are basically unchanged, but she had a lot more drainage, which was thick and somewhat purulent in appearance. No significant odor. 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 They are quite painful today and she does not want to have debridement performed. 05/30/2022: The patient underwent surgical debridement and placement of TheraSkin to her wounds on January 5. She has been doing well and does not endorse any pain. The TheraSkin is intact and looks beautiful. It is sutured in place. There is no exudate or significant drainage. 06/04/2022: Her TheraSkin remains intact and I am starting to see granulation tissue emerging underneath the surface. No concern for infection. 06/12/2022: The TheraSkin is intact and there is more granulation tissue emerging. It is starting to look a little bit dry, however. No malodor or purulent drainage. 06/19/2022: The TheraSkin continues to look good. The edges are a bit dry, but the central portion of each of her wounds is showing a nice pink color. 06/27/2022: The TheraSkin on the lateral wound remains almost completely intact. There is nice pink tissue peaking through the mesh of the TheraSkin. On the medial leg, it is starting to breakdown a little bit, but most of the TheraSkin remains intact here, as well. Both wounds measured smaller today. 07/03/2022: The wounds are fairly dry around the perimeter today. Some of the suture is hanging loose. 07/12/2022: Both wounds are measuring smaller today. The medial ulcer is getting a bit too dry. The lateral ulcer  continues to have nice buds of granulation tissue emerging. 07/26/2022: The moisture balance on both wounds is much better today. For the first time since I began seeing her, I see actual beefy red granulation tissue on the lateral ankle wound. There are more buds of deep pink granulation tissue emerging on the medial ankle. There is some dry eschar around the edges of the medial wound and a tiny amount of slough overlying the good granulation tissue on the lateral wound. 08/09/2022: Unfortunately, she has a new wound on her left lateral lower leg, just above the ankle. It is extremely painful for her. It penetrates to the fat layer and has the same woody, fibrotic character as her previous wounds. The other wounds are both a little bit smaller with slough and some eschar around the periphery. 08/14/2022: Her right lateral ankle wound is a little bit smaller. Unfortunately, she has had an increase in her drainage and it has a purulent nature to it, similar to what was present prior to her surgical debridement. She also reports odor coming from her wounds this week. The left lateral leg wound remains extremely tender. 08/21/2022: The left lateral ankle wound is larger and more painful today. Both right ankle wounds have a little bit more vital appearance, but they have accumulated more slough. She reports less odor this week. She is currently taking Augmentin. 08/28/2022: The left lateral ankle wound continues to enlarge and remains quite painful. Both of the right ankle wounds actually look better this week. There is improved tissue quality with less slough accumulation. 09/27/2022: The left lateral ankle wound actually looks nearly healed. She says that she has been applying some silver alginate that she had leftover. She was unable to afford the Santyl that was prescribed, but found a tube that she had on hand at home and has been using this on her right ankle wounds. These wounds  look about the same and  have accumulated the usual layer of slough. Unfortunately, a portion of the dorsal foot wound has reopened and is exquisitely painful today. 10/09/2022: The left lateral ankle wound is healed. The dorsal foot wound appears to have expanded and remains exquisitely painful. The medial and lateral right ankle wounds look about the same size, but they are quite a bit cleaner. 10/23/2022: The left lateral ankle remains closed. The dorsal foot wound has expanded to the point that it now converges with the right lateral leg wound. She has accumulated fairly thick slough on all of the open surfaces. POLA, YUN ([id number]) [id number] Page 13 of 17 11/06/2022: No significant change to any of her wounds, although the dorsal foot aspect of the right lateral wound is a little bit smaller. She has been approved for the [employer] and [employer] for [employer] and will be able to get a full year's supply. 11/20/2022: The dorsal foot aspect of the right lateral wound has almost completely closed. I am seeing nice pink tissue starting to emerge from the fibrotic surface of both wounds. The medial leg wound appears to be a little bit smaller today. She has been using Santyl. 12/04/2022: The dorsal foot aspect of the right lateral wound has closed. There is more pink granulation tissue emerging on her wounds. There are buds of epithelium beginning to fill in from the edges of her wound, particularly at the proximal aspect of the medial wound and the distal aspect of the lateral wound. Significantly less slough formation than in the past. 12/18/2022: The wounds measured about the same size today, but there is better granulation tissue filling in and the overall surface consistency is improving. She does have slough accumulation on the surfaces, but no malodor or purulent drainage. Patient History Information obtained from Patient. Family History Diabetes - Mother, Lung Disease -  Mother, No family history of Cancer, Heart Disease, Hereditary Spherocytosis, Hypertension, Kidney Disease, Seizures, Stroke, Thyroid Problems, Tuberculosis. Social History Never smoker, Marital Status - Married, Alcohol Use - Never, Drug Use - No History, Caffeine Use - Daily. Medical History Eyes Denies history of Cataracts, Glaucoma, Optic Neuritis Ear/Nose/Mouth/Throat Denies history of Chronic sinus problems/congestion, Middle ear problems Hematologic/Lymphatic Patient has history of Anemia, Sickle Cell Disease Denies history of Hemophilia, Human Immunodeficiency Virus, Lymphedema Respiratory Denies history of Aspiration, Asthma, Chronic Obstructive Pulmonary Disease (COPD), Pneumothorax, Sleep Apnea, Tuberculosis Cardiovascular Denies history of Angina, Arrhythmia, Congestive Heart Failure, Coronary Artery Disease, Deep Vein Thrombosis, Hypertension, Hypotension, Myocardial Infarction, Peripheral Arterial Disease, Peripheral Venous Disease, Phlebitis, Vasculitis Gastrointestinal Denies history of Cirrhosis , Colitis, Crohns, Hepatitis A, Hepatitis B, Hepatitis C Endocrine Denies history of Type I Diabetes, Type II Diabetes Genitourinary Denies history of End Stage Renal Disease Immunological Denies history of Lupus Erythematosus, Raynauds, Scleroderma Integumentary (Skin) Denies history of History of Burn Musculoskeletal Denies history of Gout, Rheumatoid Arthritis, Osteoarthritis, Osteomyelitis Neurologic Patient has history of Neuropathy - right foot intermittant Denies history of Dementia, Quadriplegia, Paraplegia, Seizure Disorder Oncologic Denies history of Received Chemotherapy, Received Radiation Psychiatric Denies history of Anorexia/bulimia, Confinement Anxiety Hospitalization/Surgery History - c section x2. - left breast lumpectomy. - iandD right ankle with theraskin. Medical A Surgical History Notes nd Constitutional Symptoms (General Health) H/O  miscarriage Cardiovascular bradycardia Gastrointestinal cholilithiasis Objective Constitutional Slightly hypertensive. no acute distress. Vitals Time Taken: 8:37 AM, Height: 67 in, Weight: 134 lbs, BMI: 21, Temperature: 98 F, Pulse: 94 bpm, Respiratory Rate: 18 breaths/min, Blood Pressure: 144/70 mmHg. Respiratory Normal work  of breathing on room air. General Notes: 12/18/2022: The wounds measured about the same size today, but there is better granulation tissue filling in and the overall surface consistency [name], [name] ([id number]) [id number] Page 14 of 17 is improving. She does have slough accumulation on the surfaces, but no malodor or purulent drainage. Integumentary (Hair, Skin) Wound #17 status is Open. Original cause of wound was Gradually Appeared. The date acquired was: 10/05/2012. The wound has been in treatment 87 weeks. The wound is located on the Right,Lateral Lower Leg. The wound measures 8cm length x 7cm width x 0.1cm depth; 43.982cm^2 area and 4.398cm^3 volume. There is Fat Layer (Subcutaneous Tissue) exposed. There is no tunneling or undermining noted. There is a medium amount of serosanguineous drainage noted. The wound margin is distinct with the outline attached to the wound base. There is small (1-33%) pink granulation within the wound bed. There is a large (67- 100%) amount of necrotic tissue within the wound bed including Adherent Slough. The periwound skin appearance had no abnormalities noted for moisture. The periwound skin appearance exhibited: Scarring, Hemosiderin Staining. The periwound skin appearance did not exhibit: Ecchymosis. Periwound temperature was noted as No Abnormality. The periwound has tenderness on palpation. Wound #21 status is Open. Original cause of wound was Gradually Appeared. The date acquired was: 06/26/2021. The wound has been in treatment 77 weeks. The wound is located on the Right,Medial Ankle. The wound  measures 9cm length x 4cm width x 0.1cm depth; 28.274cm^2 area and 2.827cm^3 volume. There is Fat Layer (Subcutaneous Tissue) exposed. There is no tunneling or undermining noted. There is a medium amount of serosanguineous drainage noted. The wound margin is distinct with the outline attached to the wound base. There is small (1-33%) pink granulation within the wound bed. There is a large (67-100%) amount of necrotic tissue within the wound bed including Adherent Slough. The periwound skin appearance had no abnormalities noted for moisture. The periwound skin appearance had no abnormalities noted for color. The periwound skin appearance exhibited: Scarring. Periwound temperature was noted as No Abnormality. The periwound has tenderness on palpation. 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 There was a Selective/Open Wound Non-Viable Tissue Debridement with a total area of 35.17 sq cm performed by Duanne Guess, MD. With the following instrument(s): Curette to remove Non-Viable tissue/material. Material removed includes [facility] after achieving pain control using Lidocaine 4% Topical Solution. No specimens were taken. A time out was conducted at 09:06, prior to the start of the procedure. A Minimum amount of bleeding was controlled with Pressure. The procedure was tolerated well. Post Debridement Measurements: 8cm length x 7cm width x 0.1cm depth; 4.398cm^3 volume. Character of Wound/Ulcer Post Debridement is improved. Post procedure Diagnosis Wound #17: Same as Pre-Procedure General Notes: scribed for Dr. Lady Gary by Samuella Bruin, RN. Wound #21 Pre-procedure diagnosis of Wound #21 is a Sickle Cell Lesion located on the Right,Medial Ankle .Severity of  Tissue Pre Debridement is: Fat layer exposed. There was a Selective/Open Wound Non-Viable Tissue Debridement with a total area of 22.61 sq cm performed by Duanne Guess, MD. With the following instrument(s): Curette to remove Non-Viable tissue/material. Material removed includes [facility] after achieving pain control using Lidocaine 4% Topical Solution. No specimens were taken. A time out was conducted at 09:06, prior to the start of  the procedure. A Minimum amount of bleeding was controlled with Pressure. The procedure was tolerated well. Post Debridement Measurements: 9cm length x 4cm width x 0.1cm depth; 2.827cm^3 volume. Character of Wound/Ulcer Post Debridement is improved. Severity of Tissue Post Debridement is: Fat layer exposed. Post procedure Diagnosis Wound #21: Same as Pre-Procedure General Notes: scribed for Dr. Lady Gary by Samuella Bruin, RN. Plan Follow-up Appointments: Return Appointment in 2 weeks. - Dr. Lady Gary Rm 2 Anesthetic: (In clinic) Topical Lidocaine 4% applied to wound bed Bathing/ Shower/ Hygiene: May shower and wash wound with soap and water. Edema Control - Lymphedema / SCD / Other: Elevate legs to the level of the heart or above for 30 minutes daily and/or when sitting for 3-4 times a day throughout the day. Avoid standing for long periods of time. Exercise regularly Additional Orders / Instructions: Follow Nutritious Diet Juven Shake 1-2 times daily. The following medication(s) was prescribed: lidocaine topical 4 % cream cream topical was prescribed at facility WOUND #17: - Lower Leg Wound Laterality: Right, Lateral Cleanser: Soap and Water 1 x Per Week/30 Days Discharge Instructions: May shower and wash wound with dial antibacterial soap and water prior to dressing change. Cleanser: Wound Cleanser 1 x Per Week/30 Days Discharge Instructions: Cleanse the wound with wound cleanser prior to applying a clean dressing using gauze sponges, not tissue or cotton  balls. Peri-Wound Care: Triamcinolone 15 (g) 1 x Per Week/30 Days Discharge Instructions: Use triamcinolone 15 (g) as directed Peri-Wound Care: Sween Lotion (Moisturizing lotion) 1 x Per Week/30 Days Discharge Instructions: Apply moisturizing lotion as directed Prim Dressing: Santyl Ointment 1 x Per Week/30 Days LICHELLE, HERSCHBERGER ([id number]) [id number] Page 15 of 17 Discharge Instructions: Apply nickel thick amount to wound bed as instructed Secondary Dressing: ABD Pad, 5x9 1 x Per Week/30 Days Discharge Instructions: Apply over primary dressing as directed. Secondary Dressing: Woven Gauze Sponge, Non-Sterile 4x4 in 1 x Per Week/30 Days Discharge Instructions: Moisten with normal saline and Apply over primary dressing as directed. Secured With: Elastic Bandage 4 inch (ACE bandage) 1 x Per Week/30 Days Discharge Instructions: Secure with ACE bandage as directed. Secured With: [employer], 4.5x3.1 (in/yd) 1 x Per Week/30 Days Discharge Instructions: Secure with Kerlix as directed. WOUND #21: - Ankle Wound Laterality: Right, Medial Cleanser: Soap and Water 1 x Per Week/30 Days Discharge Instructions: May shower and wash wound with dial antibacterial soap and water prior to dressing change. Cleanser: Wound Cleanser 1 x Per Week/30 Days Discharge Instructions: Cleanse the wound with wound cleanser prior to applying a clean dressing using gauze sponges, not tissue or cotton balls. Peri-Wound Care: Triamcinolone 15 (g) 1 x Per Week/30 Days Discharge Instructions: Use triamcinolone 15 (g) as directed Peri-Wound Care: Sween Lotion (Moisturizing lotion) 1 x Per Week/30 Days Discharge Instructions: Apply moisturizing lotion as directed Prim Dressing: Santyl Ointment 1 x Per Week/30 Days ary Discharge Instructions: Apply nickel thick amount to wound bed as instructed Secondary Dressing: ABD Pad, 5x9 1 x Per Week/30 Days Discharge Instructions: Apply over  primary dressing as directed. Secondary Dressing: Woven Gauze Sponge, Non-Sterile 4x4 in 1 x Per Week/30 Days Discharge Instructions: Moisten with normal saline and Apply over primary dressing as directed. Secured With: Elastic Bandage 4 inch (ACE bandage) 1 x Per Week/30 Days Discharge Instructions: Secure with ACE bandage as directed. Secured With: [employer], 4.5x3.1 (in/yd) 1 x Per Week/30 Days Discharge Instructions: Secure with Kerlix as directed. 12/18/2022: The wounds measured about the same size today, but there  is better granulation tissue filling in and the overall surface consistency is improving. She does have slough accumulation on the surfaces, but no malodor or purulent drainage. I used a curette to debride the slough from her wounds. Her pain control was good enough that I was able to debride the entire surface of each site. We will continue Santyl with saline moistened gauze, Kerlix and Ace wrap. Follow-up in 2 weeks. Electronic Signature(s) Signed: 12/18/2022 9:45:51 AM By: Duanne Guess MD FACS Entered By: Duanne Guess on 12/18/2022 09:45:51 -------------------------------------------------------------------------------- HxROS Details Patient Name: Date of Service: [name], [name] 12/18/2022 8:30 A M Medical Record Number: [id number] Patient Account Number: [id number] Date of Birth/Sex: Treating RN: 11-09-1970 (52 y.o. F) Primary Care Provider: Julianne Handler Other Clinician: Referring Provider: Treating Provider/Extender: Koleen Nimrod Weeks in Treatment: 23 Information Obtained From Patient Constitutional Symptoms (General Health) Medical History: Past Medical History Notes: H/O miscarriage Eyes Medical History: Negative for: Cataracts; Glaucoma; Optic Neuritis Ear/Nose/Mouth/Throat Medical History: Negative for: Chronic sinus problems/congestion; Middle ear problems Hematologic/Lymphatic ECHO, CHAPARRO ([id number])  [id number] Page 16 of 17 Medical History: Positive for: Anemia; Sickle Cell Disease Negative for: Hemophilia; Human Immunodeficiency Virus; Lymphedema Respiratory Medical History: Negative for: Aspiration; Asthma; Chronic Obstructive Pulmonary Disease (COPD); Pneumothorax; Sleep Apnea; Tuberculosis Cardiovascular Medical History: Negative for: Angina; Arrhythmia; Congestive Heart Failure; Coronary Artery Disease; Deep Vein Thrombosis; Hypertension; Hypotension; Myocardial Infarction; Peripheral Arterial Disease; Peripheral Venous Disease; Phlebitis; Vasculitis Past Medical History Notes: bradycardia Gastrointestinal Medical History: Negative for: Cirrhosis ; Colitis; Crohns; Hepatitis A; Hepatitis B; Hepatitis C Past Medical History Notes: cholilithiasis Endocrine Medical History: Negative for: Type I Diabetes; Type II Diabetes Genitourinary Medical History: Negative for: End Stage Renal Disease Immunological Medical History: Negative for: Lupus Erythematosus; Raynauds; Scleroderma Integumentary (Skin) Medical History: Negative for: History of Burn Musculoskeletal Medical History: Negative for: Gout; Rheumatoid Arthritis; Osteoarthritis; Osteomyelitis Neurologic Medical History: Positive for: Neuropathy - right foot intermittant Negative for: Dementia; Quadriplegia; Paraplegia; Seizure Disorder Oncologic Medical History: Negative for: Received Chemotherapy; Received Radiation Psychiatric Medical History: Negative for: Anorexia/bulimia; Confinement Anxiety Immunizations Pneumococcal Vaccine: Received Pneumococcal Vaccination: No Implantable Devices None Hospitalization / Surgery History Type of Hospitalization/Surgery c section x2 left breast lumpectomy iandD right ankle with theraskin [name] ([id number]) [id number] Page 69 of 72 Family and Social History Cancer: No; Diabetes: Yes - Mother; Heart  Disease: No; Hereditary Spherocytosis: No; Hypertension: No; Kidney Disease: No; Lung Disease: Yes - Mother; Seizures: No; Stroke: No; Thyroid Problems: No; Tuberculosis: No; Never smoker; Marital Status - Married; Alcohol Use: Never; Drug Use: No History; Caffeine Use: Daily; Financial Concerns: No; Food, Clothing or Shelter Needs: No; Support System Lacking: No; Transportation Concerns: No Electronic Signature(s) Signed: 12/18/2022 12:15:41 PM By: Duanne Guess MD FACS Entered By: Duanne Guess on 12/18/2022 09:43:43 -------------------------------------------------------------------------------- SuperBill Details Patient Name: Date of Service: [name], [name] 12/18/2022 Medical Record Number: [id number] Patient Account Number: [id number] Date of Birth/Sex: Treating RN: 1970/06/09 (52 y.o. F) Primary Care Provider: Julianne Handler Other Clinician: Referring Provider: Treating Provider/Extender: Koleen Nimrod Weeks in Treatment: 37 Diagnosis Coding ICD-10 Codes Code Description L97.818 Non-pressure chronic ulcer of other part of right lower leg with other specified severity 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 Facility Procedures : CPT4 Code: [id number] Description: 97597 - DEBRIDE WOUND 1ST 20 SQ CM OR < ICD-10 Diagnosis Description L97.818 Non-pressure chronic ulcer of other part of right lower leg with other specified s Modifier: everity Quantity: 1 : CPT4 Code: [id number] Description: 34742 - DEBRIDE  WOUND EA ADDL 20 SQ CM ICD-10 Diagnosis Description L97.818 Non-pressure chronic ulcer of other part of right lower leg with other specified s Modifier: everity Quantity: 2 Physician Procedures : CPT4 Code Description Modifier [id number] 99214 - WC PHYS LEVEL 4 - EST PT 25 ICD-10 Diagnosis Description L97.818 Non-pressure chronic ulcer of other part of right lower leg with other specified severity 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 Quantity: 1 :  [id number] 97597 - WC PHYS DEBR WO ANESTH 20 SQ CM ICD-10 Diagnosis Description L97.818 Non-pressure chronic ulcer of other part of right lower leg with other specified severity Quantity: 1 : [id number] 97598 - WC PHYS DEBR WO ANESTH EA ADD 20 CM ICD-10 Diagnosis Description L97.818 Non-pressure chronic ulcer of other part of right lower leg with other specified severity Quantity: 2 Electronic Signature(s) Signed: 12/18/2022 9:47:01 AM By: Duanne Guess MD FACS Entered By: Duanne Guess on 12/18/2022 09:47:00

## 2022-12-24 ENCOUNTER — Ambulatory Visit (INDEPENDENT_AMBULATORY_CARE_PROVIDER_SITE_OTHER): Payer: Medicaid Other | Admitting: Family Medicine

## 2022-12-24 ENCOUNTER — Encounter: Payer: Self-pay | Admitting: Family Medicine

## 2022-12-24 VITALS — BP 122/51 | HR 85 | Temp 97.3°F | Wt 133.6 lb

## 2022-12-24 DIAGNOSIS — G894 Chronic pain syndrome: Secondary | ICD-10-CM | POA: Diagnosis not present

## 2022-12-24 DIAGNOSIS — E559 Vitamin D deficiency, unspecified: Secondary | ICD-10-CM

## 2022-12-24 DIAGNOSIS — Z1211 Encounter for screening for malignant neoplasm of colon: Secondary | ICD-10-CM | POA: Diagnosis not present

## 2022-12-24 DIAGNOSIS — M87052 Idiopathic aseptic necrosis of left femur: Secondary | ICD-10-CM

## 2022-12-24 DIAGNOSIS — D571 Sickle-cell disease without crisis: Secondary | ICD-10-CM

## 2022-12-24 DIAGNOSIS — D5700 Hb-SS disease with crisis, unspecified: Secondary | ICD-10-CM

## 2022-12-24 MED ORDER — IBUPROFEN 600 MG PO TABS
600.0000 mg | ORAL_TABLET | Freq: Three times a day (TID) | ORAL | 5 refills | Status: DC | PRN
Start: 2022-12-24 — End: 2023-04-01

## 2022-12-24 MED ORDER — OXYCODONE HCL 10 MG PO TABS
10.0000 mg | ORAL_TABLET | ORAL | 0 refills | Status: DC
Start: 2022-12-24 — End: 2023-01-24

## 2022-12-24 NOTE — Progress Notes (Signed)
Established Patient Office Visit  Subjective   Patient ID: [name], female    DOB: 1971/03/06  Age: 52 y.o. MRN: [id number]  No chief complaint on file.   [name] is a 52 year old female with a medical history significant for sickle cell disease, chronic pain syndrome, opiate dependence and tolerance, bilateral foot ulcers, and anemia of chronic disease that presents for follow-up of chronic conditions.  Patient states that she has been doing well.  However, she continues to have increased pain primarily to her left hip.  Pain has been increasing over the past several months.  Patient has been utilizing a cane to assist with ambulation.  She is not currently under the care of orthopedic specialist.  Patient rates her pain a 6/10.  Chronic pain has been controlled on oxycodone and ibuprofen.  Patient last had oxycodone this a.m. with moderate relief. She denies any fever, chills, chest pain, or shortness of breath, no urinary symptoms, nausea, vomiting, or diarrhea.  No sick contacts or recent travel.    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   Sickle-cell disease with pain (HCC) 09/03/2018   Other chronic pain    Sickle cell crisis (HCC) 06/26/2018   Sickle cell pain crisis (HCC) 04/07/2018   Avascular necrosis of bone of hip, left (HCC) 12/08/2017   Avascular necrosis of bone of hip, right (HCC) 12/08/2017   Hb-SS disease without crisis (HCC) 11/26/2017   Anemia of chronic disease 11/26/2017   Abnormal quad screen    Ringworm of body  04/05/2017   Ulcer of right lower extremity (HCC)    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HCC) 03/02/2017   History of pre-eclampsia 12/16/2016   Previous cesarean delivery x 2 07/22/2013   Hemochromatosis 11/10/2012   Chronic pain syndrome 10/13/2012   Complicated wound infection 04/01/2012   Leukocytosis 03/30/2012   Past Medical History:  Diagnosis Date   Anemia 03/30/2012   Hx of sickle cell disease   CAP (community acquired pneumonia)    2014   Cholelithiasis 03/30/2012   Chronic wound of extremity 04/01/2012   Elevated LFTs    Leg ulcer (HCC) 10/27/2012   Chronic under care of wound clinic   Multiple open wounds of lower extremity    chronic wounds B/LLE   Sickle cell disease (HCC)    Sinus bradycardia by electrocardiogram 04/03/2012   Past Surgical History:  Procedure Laterality Date   ALLOGRAFT APPLICATION Right 02/14/2015   Procedure: SURGICAL PREP FOR GRAFTING RIGHT LOWER EXTREMITY AND APPLICATION OF Marcellus Scott;  Surgeon: Glenna Fellows, MD;  Location: [facility];  Service: Plastics;  Laterality: Right;   APPLICATION OF A-CELL OF EXTREMITY Right 10/09/2015   Procedure: APPLICATION OF Marcellus Scott;  Surgeon: Glenna Fellows, MD;  Location: [facility];  Service: Plastics;  Laterality: Right;   BREAST SURGERY     left breast cyst aspiration   CESAREAN SECTION     CESAREAN SECTION N/A 01/06/2014   Procedure: CESAREAN SECTION;  Surgeon: Allie Bossier, MD;  Location: [facility];  Service: Obstetrics;  Laterality:  N/A;   CESAREAN SECTION N/A 05/04/2017   Procedure: CESAREAN SECTION;  Surgeon: Adam Phenix, MD;  Location: [facility];  Service: Obstetrics;  Laterality: N/A;   I & D EXTREMITY Right 10/09/2015   Procedure: SURGICAL PREPARATION FOR GRAFTING RIGHT ANKLE AND APPLICATION THERASKIN;  Surgeon: Glenna Fellows, MD;  Location: [facility];  Service: Plastics;  Laterality: Right;   SKIN FULL THICKNESS GRAFT Bilateral  06/17/2016   Procedure: SURGICAL PREP FOR GRAFTING, BILATERAL LOWER EXTREMITIES AND APPLICATION OF Marcellus Scott;  Surgeon: Glenna Fellows, MD;  Location: [facility];  Service: Plastics;  Laterality: Bilateral;   TONSILLECTOMY     Social History   Tobacco Use   Smoking status: Never    Passive exposure: Never   Smokeless tobacco: Never  Vaping Use   Vaping status: Never Used  Substance Use Topics   Alcohol use: No   Drug use: No   Social History   Socioeconomic History   Marital status: Married    Spouse name: Arvilla Meres   Number of children: 1   Years of education: Not on file   Highest education level: Not on file  Occupational History   Occupation: Employed in home care.  Tobacco Use   Smoking status: Never    Passive exposure: Never   Smokeless tobacco: Never  Vaping Use   Vaping status: Never Used  Substance and Sexual Activity   Alcohol use: No   Drug use: No   Sexual activity: Yes    Birth control/protection: None  Other Topics Concern   Not on file  Social History Narrative   Lives with husband.   Social Determinants of Health   Financial Resource Strain: Not on file  Food Insecurity: Low Risk  (05/24/2022)   [facility]   Food vital sign    Within the past 12 months, you worried that your food would run out before you got money to buy more: Never true    Within the past 12 months, the food you bought just didn't last and you didn't have money to get more: Not on file  Transportation Needs: Not on file (05/24/2022)  Physical Activity: Not on file  Stress: Not on file  Social Connections: Not on file  Intimate Partner Violence: Low Risk  (05/24/2022)   Received from [facility] visits prior to 07/20/2022., [facility] visits prior to 07/20/2022.   Safety    How often does anyone, including family and friends, physically hurt you?: Never    How often does anyone, including family and  friends, insult or talk down to you?: Never    How often does anyone, including family and friends, threaten you with harm?: Never    How often does anyone, including family and friends, scream or curse at you?: Never   Family Status  Relation Name Status   Sister  (Not Specified)   Mother  Alive   Father  Deceased  No partnership data on file   Family History  Problem Relation Age of Onset   Sickle cell anemia Sister    Diabetes Mother    Asthma Mother    Sickle cell trait Mother    Sickle cell trait Father    Hypertension Father    No Known Allergies    Review of Systems  Constitutional:  Negative for chills and fever.  HENT: Negative.    Eyes: Negative.   Respiratory: Negative.    Cardiovascular:  Negative.   Gastrointestinal: Negative.   Musculoskeletal:  Positive for back pain and joint pain.  Skin: Negative.   Neurological: Negative.   Psychiatric/Behavioral: Negative.        Objective:     BP (!) 122/51   Pulse 85   Temp (!) 97.3 F (36.3 C)   Wt 133 lb 9.6 oz (60.6 kg)   SpO2 94%   BMI 20.92 kg/m  BP Readings from Last 3 Encounters:  12/24/22 (!) 122/51  10/08/22 113/67  09/24/22 (!) 114/51   Wt Readings from Last 3 Encounters:  12/24/22 133 lb 9.6 oz (60.6 kg)  09/24/22 131 lb 3.2 oz (59.5 kg)  06/18/22 134 lb 3.2 oz (60.9 kg)      Physical Exam Constitutional:      Appearance: Normal appearance. She is not toxic-appearing.  Eyes:     General: Scleral icterus present.     Pupils: Pupils are equal, round, and reactive to light.  Cardiovascular:     Rate and Rhythm: Normal rate and regular rhythm.  Pulmonary:     Effort: Pulmonary effort is normal.  Abdominal:     General: Bowel sounds are normal.  Musculoskeletal:     Right hip: No deformity. Decreased range of motion.     Left hip: Tenderness present. No deformity. Decreased range of motion.  Neurological:     General: No focal deficit present.     Mental Status: She is alert.  Mental status is at baseline.  Psychiatric:        Mood and Affect: Mood normal.        Behavior: Behavior normal.        Thought Content: Thought content normal.        Judgment: Judgment normal.      No results found for any visits on 12/24/22.  Last CBC Lab Results  Component Value Date   WBC 12.6 (H) 10/28/2022   HGB 8.1 (L) 10/28/2022   HCT 23.3 (L) 10/28/2022   MCV 87.6 10/28/2022   MCH 30.5 10/28/2022   RDW 19.2 (H) 10/28/2022   PLT 493 (H) 10/28/2022   Last metabolic panel Lab Results  Component Value Date   GLUCOSE 89 10/28/2022   NA 137 10/28/2022   K 4.2 10/28/2022   CL 108 10/28/2022   CO2 23 10/28/2022   BUN 11 10/28/2022   CREATININE 0.50 10/28/2022   GFRNONAA >60 10/28/2022   CALCIUM 8.7 (L) 10/28/2022   PROT 7.7 10/28/2022   ALBUMIN 4.0 10/28/2022   LABGLOB 2.9 09/24/2022   AGRATIO 1.5 09/24/2022   BILITOT 1.8 (H) 10/28/2022   ALKPHOS 61 10/28/2022   AST 47 (H) 10/28/2022   ALT 34 10/28/2022   ANIONGAP 6 10/28/2022   Last lipids Lab Results  Component Value Date   CHOL 143 11/25/2014   HDL 27 (L) 11/25/2014   LDLCALC 89 11/25/2014   TRIG 137 11/25/2014   CHOLHDL 5.3 11/25/2014   Last hemoglobin A1c Lab Results  Component Value Date   HGBA1C 4.4 (L) 03/20/2017   Last thyroid functions Lab Results  Component Value Date   TSH 0.099 (L) 03/20/2017   Last vitamin D Lab Results  Component Value Date   VD25OH 21.4 (L) 09/24/2022   Last vitamin B12 and Folate Lab Results  Component Value Date   FOLATE 9.5 03/02/2018      The ASCVD Risk score (Arnett DK, et al., 2019) failed to calculate for the following reasons:   Cannot find a previous HDL  lab   Cannot find a previous total cholesterol lab    Assessment & Plan:   Problem List Items Addressed This Visit       Musculoskeletal and Integument   Avascular necrosis of bone of hip, left (HCC) - Primary   Relevant Orders   Ambulatory referral to Orthopedics     Other    Chronic pain syndrome (Chronic)   Relevant Medications   ibuprofen (ADVIL) 600 MG tablet   Oxycodone HCl 10 MG TABS   Other Relevant Orders   [id number] 11+Oxyco+Alc+Crt-Bund   Sickle-cell disease with pain (HCC)   Relevant Orders   Urinalysis Dipstick   Sickle Cell Panel   Hb-SS disease without crisis (HCC)   Relevant Medications   ibuprofen (ADVIL) 600 MG tablet   Other Visit Diagnoses     Colon cancer screening       Relevant Orders   Cologuard   Vitamin D deficiency       Relevant Orders   Sickle Cell Panel      1. Avascular necrosis of bone of hip, left (HCC) Pain and left hip which been worsening over the past several months.  Patient utilizing cane to assist with ambulation.  Patient would benefit from further treatment and evaluation by orthopedic specialist - Ambulatory referral to Orthopedics  2. Colon cancer screening  - Cologuard  3. Chronic pain syndrome Reviewed PDMP substance reporting system prior to prescribing opiate medications. No inconsistencies noted.   - [id number] 11+Oxyco+Alc+Crt-Bund - ibuprofen (ADVIL) 600 MG tablet; Take 1 tablet (600 mg total) by mouth every 8 (eight) hours as needed.  Dispense: 30 tablet; Refill: 5 - Oxycodone HCl 10 MG TABS; Take 1 tablet (10 mg total) by mouth every 4 (four) hours while awake.  Dispense: 90 tablet; Refill: 0  4. Vitamin D deficiency  - Sickle Cell Panel  5. Sickle-cell disease with pain (HCC)  - Urinalysis Dipstick - Sickle Cell Panel  6. Hb-SS disease without crisis (HCC)  - ibuprofen (ADVIL) 600 MG tablet; Take 1 tablet (600 mg total) by mouth every 8 (eight) hours as needed.  Dispense: 30 tablet; Refill: 5  Return in about 3 months (around 03/26/2023) for sickle cell anemia.   Nolon Nations  APRN, MSN, FNP-C Patient Care [facility] [address] [place], [place] [address] [phone]

## 2022-12-26 ENCOUNTER — Ambulatory Visit: Payer: Medicaid Other | Admitting: Physician Assistant

## 2022-12-31 ENCOUNTER — Ambulatory Visit: Payer: Medicaid Other | Admitting: Physician Assistant

## 2022-12-31 ENCOUNTER — Encounter: Payer: Self-pay | Admitting: Physician Assistant

## 2022-12-31 ENCOUNTER — Other Ambulatory Visit (INDEPENDENT_AMBULATORY_CARE_PROVIDER_SITE_OTHER): Payer: Medicaid Other

## 2022-12-31 DIAGNOSIS — M25552 Pain in left hip: Secondary | ICD-10-CM

## 2022-12-31 NOTE — Progress Notes (Signed)
Office Visit Note   Patient: [name]           Date of Birth: 20-Dec-1970           MRN: [id number] Visit Date: 12/31/2022              Requested by: Massie Maroon, FNP [address] [place],  [place] [address] PCP: Massie Maroon, FNP   Assessment & Plan: Visit Diagnoses:  1. Pain in left hip     Plan: Impression is left hip avascular necrosis with collapse of the femoral head.  Based on the patient's radiographs, I recommended total hip arthroplasty.  The patient tells me that she is unable to proceed as she has no help with childcare and has 3 little kids at home.  She has asked me about cortisone injection as this was recommended by what sounds like her PCP.  I have explained to her that this could cause further collapse and I do not recommend this.  She understands and agrees.  She will try and coordinate childcare so she can undergo total hip replacement at some point the future.  Once she secures this, she will follow-up with Dr. Roda Shutters to schedule.  Otherwise, follow-up as needed.  Follow-Up Instructions: Return if symptoms worsen or fail to improve.   Orders:  Orders Placed This Encounter  Procedures   XR HIP UNILAT W OR W/O PELVIS 2-3 VIEWS LEFT   No orders of the defined types were placed in this encounter.     Procedures: No procedures performed   Clinical Data: No additional findings.   Subjective: Chief Complaint  Patient presents with   Left Hip - Pain    HPIpatient is a pleasant 52 year old female who comes in today with left hip pain.  Symptoms have been ongoing for several decades.  All of her pain is to the groin and is constant but worse with walking or standing too long.  She has been taking ibuprofen and oxycodone for which she chronically takes for her underlying sickle cell disease.  She tells me that her PCP has recommended cortisone injection as she is unable to undergo total hip replacement as she has no help with  childcare.  Review of Systems as detailed in HPI.  All others reviewed and are negative.   Objective: Vital Signs: There were no vitals taken for this visit.  Physical Exam well-developed and well-nourished female no acute distress.  Alert and oriented x 3.  Ortho Exam left hip exam: Minimal to no rotation of the left hip.  She is neurovascularly intact distally.  Specialty Comments:  No specialty comments available.  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   Sickle-cell disease with pain (HCC) 09/03/2018   Other chronic pain    Sickle cell crisis (HCC) 06/26/2018   Sickle cell pain crisis (  HCC) 04/07/2018   Avascular necrosis of bone of hip, left (HCC) 12/08/2017   Avascular necrosis of bone of hip, right (HCC) 12/08/2017   Hb-SS disease without crisis (HCC) 11/26/2017   Anemia of chronic disease 11/26/2017   Abnormal quad screen    Ringworm of body 04/05/2017   Ulcer of right lower extremity (HCC)    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HCC) 03/02/2017   History of pre-eclampsia 12/16/2016   Previous cesarean delivery x 2 07/22/2013   Hemochromatosis 11/10/2012   Chronic pain syndrome 10/13/2012   Complicated wound infection 04/01/2012   Leukocytosis 03/30/2012   Past Medical History:  Diagnosis Date   Anemia 03/30/2012   Hx of sickle  cell disease   CAP (community acquired pneumonia)    2014   Cholelithiasis 03/30/2012   Chronic wound of extremity 04/01/2012   Elevated LFTs    Leg ulcer (HCC) 10/27/2012   Chronic under care of wound clinic   Multiple open wounds of lower extremity    chronic wounds B/LLE   Sickle cell disease (HCC)    Sinus bradycardia by electrocardiogram 04/03/2012    Family History  Problem Relation Age of Onset   Sickle cell anemia Sister    Diabetes Mother    Asthma Mother    Sickle cell trait Mother    Sickle cell trait Father    Hypertension Father     Past Surgical History:  Procedure Laterality Date   ALLOGRAFT APPLICATION Right 02/14/2015   Procedure: SURGICAL PREP FOR GRAFTING RIGHT LOWER EXTREMITY AND APPLICATION OF Marcellus Scott;  Surgeon: Glenna Fellows, MD;  Location: [facility];  Service: Plastics;  Laterality: Right;   APPLICATION OF A-CELL OF EXTREMITY Right 10/09/2015   Procedure: APPLICATION OF Marcellus Scott;  Surgeon: Glenna Fellows, MD;  Location: [facility];  Service: Plastics;  Laterality: Right;   BREAST SURGERY     left breast cyst aspiration   CESAREAN SECTION     CESAREAN SECTION N/A 01/06/2014   Procedure: CESAREAN SECTION;  Surgeon: Allie Bossier, MD;  Location: [facility];  Service: Obstetrics;  Laterality: N/A;   CESAREAN SECTION N/A 05/04/2017   Procedure: CESAREAN SECTION;  Surgeon: Adam Phenix, MD;  Location: [facility];  Service: Obstetrics;  Laterality: N/A;   I & D EXTREMITY Right 10/09/2015   Procedure: SURGICAL PREPARATION FOR GRAFTING RIGHT ANKLE AND APPLICATION THERASKIN;  Surgeon: Glenna Fellows, MD;  Location: [facility];  Service: Plastics;  Laterality: Right;   SKIN FULL THICKNESS GRAFT Bilateral 06/17/2016   Procedure: SURGICAL PREP FOR GRAFTING, BILATERAL LOWER EXTREMITIES AND APPLICATION OF Marcellus Scott;  Surgeon: Glenna Fellows, MD;  Location: [facility];  Service: Plastics;  Laterality: Bilateral;    TONSILLECTOMY     Social History   Occupational History   Occupation: Employed in home care.  Tobacco Use   Smoking status: Never    Passive exposure: Never   Smokeless tobacco: Never  Vaping Use   Vaping status: Never Used  Substance and Sexual Activity   Alcohol use: No   Drug use: No   Sexual activity: Yes    Birth control/protection: None

## 2023-01-01 ENCOUNTER — Encounter (HOSPITAL_BASED_OUTPATIENT_CLINIC_OR_DEPARTMENT_OTHER): Payer: Medicaid Other | Attending: General Surgery | Admitting: General Surgery

## 2023-01-01 DIAGNOSIS — I872 Venous insufficiency (chronic) (peripheral): Secondary | ICD-10-CM | POA: Diagnosis present

## 2023-01-01 DIAGNOSIS — I82531 Chronic embolism and thrombosis of right popliteal vein: Secondary | ICD-10-CM | POA: Insufficient documentation

## 2023-01-01 DIAGNOSIS — D571 Sickle-cell disease without crisis: Secondary | ICD-10-CM | POA: Insufficient documentation

## 2023-01-01 DIAGNOSIS — Z833 Family history of diabetes mellitus: Secondary | ICD-10-CM | POA: Insufficient documentation

## 2023-01-01 DIAGNOSIS — I10 Essential (primary) hypertension: Secondary | ICD-10-CM | POA: Diagnosis not present

## 2023-01-01 DIAGNOSIS — L97818 Non-pressure chronic ulcer of other part of right lower leg with other specified severity: Secondary | ICD-10-CM | POA: Diagnosis not present

## 2023-01-01 LAB — CMP14+CBC/D/PLT+FER+RETIC+V...
Alkaline Phosphatase: 134 IU/L — ABNORMAL HIGH (ref 44–121)
Basos: 1 %
Creatinine, Ser: 0.56 mg/dL — ABNORMAL LOW (ref 0.57–1.00)
Neutrophils Absolute: 8.5 10*3/uL — ABNORMAL HIGH (ref 1.4–7.0)
WBC: 12.8 10*3/uL — ABNORMAL HIGH (ref 3.4–10.8)

## 2023-01-02 NOTE — Progress Notes (Signed)
AMELI, BOECKER ([id number]) [id number] Page 1 of 18 Visit Report for 01/01/2023 Chief Complaint Document Details Patient Name: Date of Service: [name] [name] 01/01/2023 10:45 A M Medical Record Number: [id number] Patient Account Number: [id number] Date of Birth/Sex: Treating RN: [name]/01/23 (52 y.o. F) Primary Care Provider: Julianne Handler Other Clinician: Referring Provider: Treating Provider/Extender: Koleen Nimrod Weeks in Treatment: 55 Information Obtained from: Patient Chief Complaint the patient is here for evaluation of her bilateral lower extremity sickle cell ulcers [name]/29/2022; patient comes in for substantial wounds on the right and left lower leg Electronic Signature(s) Signed: 01/01/2023 [name]:12:58 AM By: Duanne Guess MD FACS Entered By: Duanne Guess on 01/01/2023 [name]:12:58 -------------------------------------------------------------------------------- Debridement Details Patient Name: Date of Service: [name], FA [name] 01/01/2023 10:45 A M Medical Record Number: [id number] Patient Account Number: [id number] Date of Birth/Sex: Treating RN: [name], [name] (53 y.o. [name] Primary Care Provider: Julianne Handler Other Clinician: Referring Provider: Treating Provider/Extender: Koleen Nimrod Weeks in Treatment: 89 Debridement Performed for Assessment: Wound #17 Right,Lateral Lower Leg Performed By: Physician Duanne Guess, MD Debridement Type: Debridement Level of Consciousness (Pre-procedure): Awake and Alert Pre-procedure Verification/Time Out Yes - 10:53 Taken: Start Time: 10:53 Pain Control: Lidocaine 5% topical ointment Percent of Wound Bed Debrided: 80% T Area Debrided (cm): otal 33.91 Tissue and other material debrided: Non-Viable, Slough, Slough Level: Non-Viable Tissue Debridement Description: Selective/Open Wound Instrument: Curette Bleeding: Minimum Hemostasis  Achieved: Pressure Response to Treatment: Procedure was tolerated well Level of Consciousness (Post- Awake and Alert procedure): Post Debridement Measurements of Total Wound Length: (cm) 9 Width: (cm) 6 Depth: (cm) 0.1 Volume: (cm) 4.241 Character of Wound/Ulcer Post Debridement: Improved Post Procedure Diagnosis [name] ([id number]) [id number] Page 2 of 18 Same as Pre-procedure Notes scribed for Dr. Lady Gary by Samuella Bruin, RN Electronic Signature(s) Signed: 01/01/2023 2:33:10 PM By: Duanne Guess MD FACS Signed: 01/01/2023 4:38:35 PM By: Gelene Mink By: Samuella Bruin on 01/01/2023 10:56:24 -------------------------------------------------------------------------------- Debridement Details Patient Name: Date of Service: [name], FA [name] 01/01/2023 10:45 A M Medical Record Number: [id number] Patient Account Number: [id number] Date of Birth/Sex: Treating RN: [name]-[birth year] (52 y.o. [name] Primary Care Provider: Julianne Handler Other Clinician: Referring Provider: Treating Provider/Extender: Koleen Nimrod Weeks in Treatment: 89 Debridement Performed for Assessment: Wound #21 Right,Medial Ankle Performed By: Physician Duanne Guess, MD Debridement Type: Debridement Severity of Tissue Pre Debridement: Fat layer exposed Level of Consciousness (Pre-procedure): Awake and Alert Pre-procedure Verification/Time Out Yes - 10:53 Taken: Start Time: 10:53 Pain Control: Lidocaine 5% topical ointment Percent of Wound Bed Debrided: 75% T Area Debrided (cm): otal 24.64 Tissue and other material debrided: Non-Viable, Slough, Slough Level: Non-Viable Tissue Debridement Description: Selective/Open Wound Instrument: Curette Bleeding: Minimum Hemostasis Achieved: Pressure Response to Treatment: Procedure was tolerated well Level of Consciousness (Post- Awake and Alert procedure): Post Debridement  Measurements of Total Wound Length: (cm) 9.3 Width: (cm) 4.5 Depth: (cm) 0.1 Volume: (cm) 3.287 Character of Wound/Ulcer Post Debridement: Improved Severity of Tissue Post Debridement: Fat layer exposed Post Procedure Diagnosis Same as Pre-procedure Notes scribed for Dr. Lady Gary by Samuella Bruin, RN Electronic Signature(s) Signed: 01/01/2023 2:33:10 PM By: Duanne Guess MD FACS Signed: 01/01/2023 4:38:35 PM By: Samuella Bruin Entered By: Samuella Bruin on 01/01/2023 10:57:13 [name] ([id number]) [id number] Page 3 of 18 -------------------------------------------------------------------------------- HPI Details Patient Name: Date of Service: [name] [name] 01/01/2023 10:45 A M Medical Record Number: [id number] Patient Account Number: [id number] Date of Birth/Sex: Treating RN: [name], [name] (  52 y.o. F) Primary Care Provider: Julianne Handler Other Clinician: Referring Provider: Treating Provider/Extender: Koleen Nimrod Weeks in Treatment: 34 History of Present Illness Location: medial and lateral ankle region on the right and left medial malleolus Quality: Patient reports experiencing a shooting pain to affected area(s). Severity: Patient states wound(s) are getting worse. Duration: right lower extremity bimalleolar ulcers have been present for approximately 2 years; the right medial malleolus ulcer has been there proximally 6 months Timing: Pain in wound is constant (hurts all the time) Context: The wound would happen gradually ssociated Signs and Symptoms: Patient reports having increase discharge. A HPI Description: 53 year old patient with a history of sickle cell anemia who was last seen by me with ulceration of the right lower extremity above the ankle and was referred to Dr. Marzetta Board for a surgical debridement as I was unable to do anything in the office due to excruciating pain. At that stage she  was referred from the plastic surgery service to dermatology who treated her for a skin infection with doxycycline and then Levaquin and a local antibiotic ointment. I understand the patient has since developed ulceration on the left ankle both medial and lateral and was now referred back to the wound center as dermatology has finished the management. I do not have any notes from the dermatology department Old notes: 52 year old patient with a history of sickle cell anemia, pain bilateral lower extremities, right lower extremity ulcer and has a history of receiving a skin graft( Theraskin) several months ago. She has been visiting the wound center [facility] and was seen by Dr. Leanord Hawking and Dr. Marzetta Board. after prolonged conservator therapy between July 2016 and January 2017. She had been seen by the plastic surgeon and taken to the OR for debridement and application of Theraskin. She had 3 applications of Theraskin and was then treated with collagen. Prior to that she had a history of similar problems in 2014 and was treated conservatively. Had a reflux study done for the right lower extremity in August 2016 without reflux or DVT . Past medical history significant for sickle cell disease, anemia, leg ulcers, cholelithiasis,and has never been a smoker. Once the patient was discharged on the wound center she says within 2 or 3 weeks the problems recurred and she has been treating it conservatively. since I saw her 3 weeks ago at [facility] she has been unable to get her dressing material but has completed a course of doxycycline. 6/7/ 2017 -- lower extremity venous duplex reflux evaluation was done No evidence of SVT or DVT in the RLL. No venous incompetence in the RLL. No further vascular workup is indicated at this time. She was seen by Dr. Mina Marble, on 10/04/2015. She agreed with the plan of taking her to the OR for debridement and application of theraskin and would also take biopsies to rule  out pyoderma gangrenosum. Follow-up note dated [name] 31 received and she was status post application of Theraskin to multiple ulcers around the right ankle. Pathology did not show evidence of malignancy or pyoderma gangrenosum. She would continue to see as in the wound clinic for further care and see Dr. Marzetta Board as needed. The patient brought the biopsy report and it was consistent with stasis ulcer no evidence of malignancy and the comment was that there was some adjacent neovascularization, fibrosis and patchy perivascular chronic inflammation. 11/15/2015 -- today we applied her first application of Theraskin 11/30/15; TheraSkin #2 12/13/2015 -- she is having a lot of pain locally  and is here for possible application of a theraskin today. 01/16/2016 -- the patient has significant pain and has noticed despite in spite of all local care and oral pain medication. It is impossible to debride her in the office. 02/06/2016 -- I do not see any notes from Dr. Leta Baptist( the patient has not made a call to the office know as she heard from them) and the only visit to recently was with her PCP Dr. Gypsy Decant -- I saw her on 01/16/2016 and prescribed 90 tablets of oxycodone 10 mg and did lab work and screening for HIV. the HIV was negative and hemoglobin was 6.3 with a WBC count of 14.9 and hematocrit of 17.8 with platelets of 561. 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 The bimalleolar ulcers to the right lower extremity have been present for approximately 2 years, the left medial malleolus ulcer has been present approximately 6 months. She has followed with Dr.Thimmappa in the past and has had a total of 3 applications of Theraskin (01/2015, 09/2015, 06/17/16). She has also followed with Dr. Meyer Russel here in the clinic and has received 2 applications of TheraSkin (11/10/15, 11/30/15). The patient  does experience chronic, and is not amenable to debridement. 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 She is not currently on any antibiotic therapy and has not been treated with any recently. 07/17/2016 -- was seen by Dr. Leta Baptist of plastic surgery who saw her 2 weeks postop application of Theraskin #3. She had removed her dressing and asked her to apply silver alginate on alternate days and follow-up back with the wound center. Future debridements and application of skin substitute would have to be done in the hospital due to her high risk for anesthesia. READMISSION [name]/29/2022 Patient is now a 52 year old woman that we have had in this clinic for a prolonged period of time and 2016-2017 and then again for 2 visits in February 2018. At that point she had wounds on the right lower leg predominantly medial. She had also been seen by plastic surgery Dr. Marzetta Board who I believe took her to the OR for operative debridement and application of TheraSkin in 2017. After she left our clinic she was followed for a very prolonged period of time in the wound care center in [facility] who then referred her ultimately to [facility] where she was seen by Dr. Mardene Speak. Again taken her to the OR for skin grafting which apparently did not take. She had multiple other attempts at dressings although I have not really looked over all of these notes in great detail. She has not been seen in a wound care center in about a year. She states over the last year in addition to her right lower leg she has developed wounds on the left lower leg quite extensive. She is using Xeroform to all of these wounds without really any improvement. She also has Medicaid which does not cover wound products. The patient has had vascular work-ups in the past including most recently on [name]/01/2021 showing biphasic waveforms on the right triphasic at the PTA and biphasic at the dorsalis pedis on the left. She  was unable to tolerate any degree of compression to do ABIs. Unfortunately TBI's were also not done. She had venous reflux studies done in 2017. This did not show any evidence of a DVT or SVT and no  venous incompetence was noted in the right leg at the time this was the only side with the wound As noted I did not look all over her old records. She apparently had a course of HBO and Baptist although I am not sure what the indication would have been. In any case she developed seizures and terminated treatment earlier. She is generally much more disabled than when we last saw her in clinic. She can no longer walk pretty much wheelchair-bound because predominantly of pain in the left hip. 04/24/2021; the patient tolerated the wraps we put on. We used Santyl and Hydrofera Blue under compression. I brought her back for a nurse visit for a change in dressing. With Medicaid we will have a hard time getting anything paid for and hence the need for compression. She arrives in clinic with all the wounds looking somewhat better in terms of surface McPherson, [name] ([id number]) [id number] Page 4 of 18 12/20; circumferential wound on the right from the lateral to the medial. She has open areas on the left medial and left lateral x2 on all of this with the same surface. This does not look completely healthy although she does have some epithelialization. She is not complaining of a lot of pain which is unusual for her sickle ulcers. I have not looked over her extensive records from [facility] She had recent arterial studies and has a history of venous reflux studies I will need to look these over although I do not believe she has significant arterial disease 2023 05/22/2021; patient's wound areas measure slightly smaller. Still a lot of drainage coming from the right we have been using Hydrofera Blue and Santyl with some improvement in the wound surfaces. She tells me she will be getting  transfused later in the week for her underlying sickle cell anemia I have looked over her recent arterial studies which were done in the fall. This was in November and showed biphasic and triphasic waveforms but she could not tolerate ABIs because of pressure and unfortunately TBI's were not done. She has not had recent venous reflux studies that I can see 1/10; not much change about the same surface area. This has a yellowish surface to it very gritty. We have been using Santyl and Hydrofera Blue for a prolonged period. Culture I did last week showed methicillin sensitive staph aureus "rare". Our intake nurse reports greenish drainage which [name] be the Hydrofera Blue itself 1/17; wounds are continue to measure smaller although I am not sure about the accuracy here. Especially the areas on the right are covered in what looks to be a nonviable surface although she does have some epithelialization. Similarly she has areas on the left medial and left lateral ankle area which appear to have a better surface and perhaps are slightly smaller. We have been using Santyl and Hydrofera Blue. She cannot tolerate mechanical debridements She went for her reflux studies which showed significant reflux at the greater saphenous vein at the saphenofemoral junction as well as the greater saphenous vein in the proximal calf on the left she had reflux in the thigh and the common femoral vein and supra vein Fishel vein reflux in the greater saphenous vein. I will have vein and vascular look at this. My thoughts have been that these are likely sickle wounds. I looked through her old records from [facility] wound care center and then when she graduated to [facility] wound care center where she saw Dr. Ronda Fairly and Dr. Mardene Speak. Although  I can see she had reflux studies done I do not see that she actually saw a vein and vascular. I went over the fact that she had operative debridements and actual skin grafting that did not  take. I do not think these wounds have ever really progressed towards healing 1/31;Substantial wounds on the right ankle area. Hyper granulated very gritty adherent debris on the surface. 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 On the left she has no DVT or SVT deep vein reflux in the common femoral vein. Superficial vein reflux in the greater saphenous vein on although the vein diameter is not really all that large. I do not think there is anything that can be done with these although I am going to send her for consultation to vein and vascular. 2/7; Wound exam; substantial wound area on the right posterior ankle area and areas on the left medial ankle and left lateral ankle. I was able to debride the left medial ankle last week fairly aggressively and it is back this week to a completely nonviable surface She will see vascular surgery this Friday and I would like them to review the venous studies and also any comments on her arterial status. If they do not see an issue here I am going to refer her to plastic surgery for an operative debridement perhaps intraoperative ACell or Integra. Eventually she will require a deep tissue culture again 2/14; substantial wound area on the right posterior ankle, medial ankle. We have been using silver alginate The patient was seen by vein and vascular she had both venous reflux studies and arterial studies. 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 She had no evidence of superficial venous thrombosis. She did have superficial vein  reflux at the saphenofemoral junction and the greater saphenous vein. On the left no evidence of a DVT no evidence of superficial venous throat thrombosis she did have deep vein reflux in the common femoral vein and superficial vein reflux in the greater saphenous vein but these were not felt to be amenable to ablation. In terms of arterial studies she had triphasic and wife biphasic wave waveforms bilaterally not felt to have a significant arterial issue. I do not get the feeling that they felt that any part of her nonhealing wounds were related to either arterial or venous issues. They did note that she had venous reflux at the right at the [facility] and [facility]. And also on the left there were reflux in the deep system at the common femoral vein and greater saphenous vein in the proximal thigh. Nothing amenable to ablation. 2/20; she is making some decent progress on the right where there is nice skin between the 2 open areas on the right ankle. The surfaces here do not look viable yet there is some surrounding epithelialization. She still has a small area on the left medial ankle area. Hyper-granulated [name]'s away always 2/28 patient has an appointment with plastic surgery on 3/8. We will see her back on 3/9. She [name] have to call us  to get the area redressed. We've been using Santyl under silver alginate. We made a nice improvement on the left medial ankle. The larger wounds on the right also looks somewhat better in terms of epithelialization although I think they could benefit from an aggressive debridement if plastic surgery would be willing to do that. Perhaps placement of Integra or a cell 07/26/2021: She saw Dr. Arita Miss yesterday. He raised the question as to whether or not this might be pyoderma and wanted to wait until that question was answered by dermatology before proceeding with any sort of operative debridement. We have continue to use Santyl under silver alginate with Kerlix and Coban  wraps. Overall, her wounds appear to be continuing to contract and epithelialize, with some granulation tissue present. There continues to be some slough on all wound surfaces. 08/09/2021: She has not been able to get an appointment with dermatology because apparently the offices in [facility] and [place] do not accept Medicaid. She is looking into whether or not she can be seen at the main [facility] dermatology clinic. This is necessary because plastic surgery is concerned that her wounds might represent pyoderma and they did not want to do any procedure until that was clarified. We have been using Santyl under silver alginate with Kerlix and Coban wraps. Today, there was a greater amount of drainage on her dressings with a slight green discoloration and significant odor. Despite this, her wounds continue to contract and epithelialize. There is pale granulation tissue present and actually, on the left medial ankle, the granulation tissue is a bit hypertrophic. 3/[name]/2023: Last week, I took a culture and this grew back rare methicillin-resistant Staph aureus and rare corynebacterium. The MRSA was sensitive to gentamicin which we began applying topically on an empiric basis. This week, her wounds are a bit smaller and the drainage and odor are less. Her primary care provider is working on assisting the patient with a dermatology evaluation. She has been in silver alginate over the gentamicin that was started last week along with Kerlix and Coban wraps. 08/23/2021: Because she has Medicaid, we have been unable to get her into see any dermatologist in the [place] to rule out pyoderma gangrenosum, which was a requirement from plastic surgery prior to any sort of debridement and grafting. Despite this, however, all of her wounds continue to get smaller. The wound on her left medial ankle is nearly closed. There is no odor from the wounds, although she still accumulates a modest amount of drainage on  her dressings. 08/30/2021: The lateral right ankle wound and the medial left ankle wound are a bit smaller today. The medial right ankle wound is about the same size. They are less tender. We have still been unable to get her into dermatology. 09/06/2021: All of the wounds are about the same size today. She continues to endorse minimal pain. I communicated with Dr. Arita Miss in plastic surgery regarding our issues getting a dermatology appointment; he was out of town but indicated that he would look into perhaps performing the biopsy in his office and will have his office contact her. [name], [name] ([id number]) [id number] Page 5 of 18 09/14/2021: The patient has an appointment in dermatology, but it is not until October. Her wounds are roughly the same; she continues to have very thick purulent-looking drainage on her dressings. 09/20/2021: The left medial wound is nearly closed and just has a bit of accumulated eschar on the surface. The right medial and lateral ankle wounds are  perhaps a little bit smaller. They continue to have a very pale surface with accumulation of thin slough. 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 I did not think Jodie [name] was indicated based on this. She is getting topical mupirocin with Prisma silver collagen. 10/04/2021: The patient was not seen in clinic last week due to childcare coverage issues. In the interim, the left medial leg wound has closed. The right sided leg wounds are smaller. There is more granulation tissue coming through, particularly on the lateral wound. The surface remains somewhat gritty. We have been applying topical mupirocin and Prisma silver collagen. 10/11/2021: The left medial leg wound remains closed. She does complain of some anesthetic sensation to the area. Both of the right-sided leg wounds are smaller but still have accumulated slough. 10/18/2021: Both right-sided leg wounds are minimally  smaller this week. She still continues to accumulate slough and has thick drainage on her dressings. 10/23/2021: Both wounds continue to contract. There is still slough buildup. She has been approved for a keratin-based skin substitute trial product but it will not be available until next week. 10/30/2021: The wounds are about the same to perhaps slightly smaller. There is still continued slough buildup. Unfortunately, the rep for the keratin based product did not show up today and did not answer his phone when called. 11/08/2021: The wounds are little bit smaller today. She continues to have thick drainage but the surfaces are relatively clean with just a little bit of slough accumulation. She reported to me today that she is unable to completely flex her left ankle and on examination it seems this is potentially related to scar tissue from her wounds. We do have the ProgenaMatrix trial product available for her today. 11/15/2021: Both wounds are smaller today. There is some slough accumulation on the surfaces, but the medial wound, in particular looks like it is filling in and is less deep. She did hear from physical therapy and she is going to start working with them on July [name]. She is here for her second application of the trial skin substitute, ProgenaMatrix. 11/22/2021: Both wounds continue to contract, the medial more dramatically than the lateral. Both wounds have a layer of slough on the surface, but underneath this, the gritty fibrous tissue has a little bit more of a pink cast to it rather than being as pale as it has been. 11/29/2021: The wounds are roughly the same size this week, perhaps a millimeter or 2 smaller. The medial wound has filled in and is nearly flush with the surrounding skin surface. She continues to have a lot of slough accumulation on both surfaces. 12/06/2021: No significant change to her wounds, but she has a new opening on her dorsal foot, just distal to the right lateral  ankle wound. The area on her left medial ankle that reopened looks a little bit larger today. She has quite a bit of pain associated with the new wound. 12/12/2021: Her wounds look about the same but the new opening on her right lateral dorsal foot is a little bit bigger. She continues to have a fair amount of pain with this wound. 12/26/2021: The left medial ankle wound is tiny and superficial. She has 2 areas of crusting on her left lateral ankle, however, that appear to be threatening to open again. Her right medial ankle wound is a little bit smaller today but still continues to accumulate thick rubbery slough. The new dorsal foot wound is exquisitely painful but  there is no odor or purulent drainage. No erythema or induration. The right lateral ankle wound looks about the same today, again with thick rubbery slough. 01/03/2022: The left medial ankle wound has closed again. Both right ankle wounds appear to be about the same size with thick rubbery slough. The dorsal foot wound on the right continues to be quite painful and she stated that she did not want any debridement of that site today. 01/10/2022: No real change to any of her wounds. She continues to accumulate thick slough. The dorsal foot wound has merged with the lateral malleolar wound. She is experiencing significant pain in the dorsal foot portion of the ulcer. 8/[name]/2023: Absolutely no change or progress in her wounds. 01/24/2022: Her wounds are unchanged. She continues to build up slough and the wounds on her dorsal right foot are still exquisitely tender. 01/31/2022: The wounds actually measure a little bit narrower today. They still have thick slough on the surface but the underlying tissue seems a little less fibrotic. We changed to Iodosorb last week. 02/07/2022: Wounds continue to slowly epithelialize around the parameters. She has less pain today. She still accumulates a fairly substantial layer of slough. 02/15/2022: No change to the  wounds overall. The measured a little bit larger today per the intake nurse. She continues to have substantial slough accumulation and her pain is a little bit worse. 02/21/2022. No change at all to her wounds. I do not see that plastic surgery has received her referral yet. 02/28/2022: The wounds remain unchanged. They are dry and fibrotic with accumulation of slough and eschar. She did receive an appointment to see plastic surgery on October 23, but the office called her back and indicated they needed to reschedule and that the next available appointment was not until December. The patient became angry and decided she did not want to see this plastics group. 10/19; the patient sees Dr. Maylene Roes again next week. She is using Medihoney as the primary dressing changing this herself. [name]/06/2021: She saw Dr. Ferd Hibbs at the wound care center at [facility]. Dr. Ferd Hibbs was also unable to debride her, secondary to pain and is planning to take her to the operating room for operative debridement and potential skin substitute placement. Her wounds are unchanged with thick yellow slough and a fibrotic base. She says they are too painful to be debrided. In addition, yesterday her hemoglobin was 4 and she received 2 units packed red blood cell transfusion. [name]/16/2023: Her operation at [facility] is scheduled for December 15. She continues to use Medihoney on her wounds. They are a little bit less painful today and she is willing to entertain the possibility of debridement. The wounds measured a little bit larger in all dimensions today but the layer of slough is not as thick as usual. [name]/[name]/2023: Her wounds actually measured a little bit smaller today and the extension onto the dorsal foot from the lateral wound has healed. They are less tender and there is significantly less slough present as compared to prior visits. 05/09/2022: She had to reschedule her surgery due to lack of childcare.  It is now planned for January 5. The wounds are basically unchanged, but she had a lot more drainage, which was thick and somewhat purulent in appearance. No significant odor. 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 They are quite painful today and she does not want to have debridement performed. 1/[name]/2024: The patient underwent surgical debridement and placement of TheraSkin to her  wounds on January 5. She has been doing well and does not endorse any pain. The TheraSkin is intact and looks beautiful. It is sutured in place. There is no exudate or significant drainage. [name], [name] ([id number]) [id number] Page 6 of 18 06/04/2022: Her TheraSkin remains intact and I am starting to see granulation tissue emerging underneath the surface. No concern for infection. 06/12/2022: The TheraSkin is intact and there is more granulation tissue emerging. It is starting to look a little bit dry, however. No malodor or purulent drainage. 06/19/2022: The TheraSkin continues to look good. The edges are a bit dry, but the central portion of each of her wounds is showing a nice pink color. 06/27/2022: The TheraSkin on the lateral wound remains almost completely intact. There is nice pink tissue peaking through the mesh of the TheraSkin. On the medial leg, it is starting to breakdown a little bit, but most of the TheraSkin remains intact here, as well. Both wounds measured smaller today. 07/03/2022: The wounds are fairly dry around the perimeter today. Some of the suture is hanging loose. 07/12/2022: Both wounds are measuring smaller today. The medial ulcer is getting a bit too dry. The lateral ulcer continues to have nice buds of granulation tissue emerging. 07/26/2022: The moisture balance on both wounds is much better today. For the first time since I began seeing her, I see actual beefy red granulation tissue on the lateral ankle wound. There are more buds of deep pink granulation  tissue emerging on the medial ankle. There is some dry eschar around the edges of the medial wound and a tiny amount of slough overlying the good granulation tissue on the lateral wound. 08/09/2022: Unfortunately, she has a new wound on her left lateral lower leg, just above the ankle. It is extremely painful for her. It penetrates to the fat layer and has the same woody, fibrotic character as her previous wounds. The other wounds are both a little bit smaller with slough and some eschar around the periphery. 08/14/2022: Her right lateral ankle wound is a little bit smaller. Unfortunately, she has had an increase in her drainage and it has a purulent nature to it, similar to what was present prior to her surgical debridement. She also reports odor coming from her wounds this week. The left lateral leg wound remains extremely tender. 08/21/2022: The left lateral ankle wound is larger and more painful today. Both right ankle wounds have a little bit more vital appearance, but they have accumulated more slough. She reports less odor this week. She is currently taking Augmentin. 08/28/2022: The left lateral ankle wound continues to enlarge and remains quite painful. Both of the right ankle wounds actually look better this week. There is improved tissue quality with less slough accumulation. 09/27/2022: The left lateral ankle wound actually looks nearly healed. She says that she has been applying some silver alginate that she had leftover. She was unable to afford the Santyl that was prescribed, but found a tube that she had on hand at home and has been using this on her right ankle wounds. These wounds look about the same and have accumulated the usual layer of slough. Unfortunately, a portion of the dorsal foot wound has reopened and is exquisitely painful today. 10/09/2022: The left lateral ankle wound is healed. The dorsal foot wound appears to have expanded and remains exquisitely painful. The medial and  lateral right ankle wounds look about the same size, but they are quite a bit cleaner. 10/23/2022: The left  lateral ankle remains closed. The dorsal foot wound has expanded to the point that it now converges with the right lateral leg wound. She has accumulated fairly thick slough on all of the open surfaces. 11/06/2022: No significant change to any of her wounds, although the dorsal foot aspect of the right lateral wound is a little bit smaller. She has been approved for the [employer] and [employer] for [employer] and will be able to get a full year's supply. 11/20/2022: The dorsal foot aspect of the right lateral wound has almost completely closed. I am seeing nice pink tissue starting to emerge from the fibrotic surface of both wounds. The medial leg wound appears to be a little bit smaller today. She has been using Santyl. 12/04/2022: The dorsal foot aspect of the right lateral wound has closed. There is more pink granulation tissue emerging on her wounds. There are buds of epithelium beginning to fill in from the edges of her wound, particularly at the proximal aspect of the medial wound and the distal aspect of the lateral wound. Significantly less slough formation than in the past. 12/18/2022: The wounds measured about the same size today, but there is better granulation tissue filling in and the overall surface consistency is improving. She does have slough accumulation on the surfaces, but no malodor or purulent drainage. 01/01/2023: No significant change to her wounds. The surface seems a little bit more fibrotic today. She is complaining of more pain. Electronic Signature(s) Signed: 01/01/2023 [name]:14:02 AM By: Duanne Guess MD FACS Entered By: Duanne Guess on 01/01/2023 [name]:14:02 -------------------------------------------------------------------------------- Physical Exam Details Patient Name: Date of Service: [name], FA [name] 01/01/2023 10:45 A M Medical Record Number:  [id number] Patient Account Number: [id number] Date of Birth/Sex: Treating RN: [birth year][name] (52 y.o. F) Primary Care Provider: Julianne Handler Other Clinician: Referring Provider: Treating Provider/Extender: Koleen Nimrod Weeks in Treatment: 67 Constitutional . . . . no acute distress. Respiratory Normal work of breathing on room air. KHYA, HIBBETT ([id number]) [id number] Page 7 of 18 Notes 01/01/2023: No significant change to her wounds. The surface seems a little bit more fibrotic today. She is complaining of more pain. Electronic Signature(s) Signed: 01/01/2023 [name]:14:49 AM By: Duanne Guess MD FACS Entered By: Duanne Guess on 01/01/2023 [name]:14:49 -------------------------------------------------------------------------------- Physician Orders Details Patient Name: Date of Service: [name], FA [name] 01/01/2023 10:45 A M Medical Record Number: [id number] Patient Account Number: [id number] Date of Birth/Sex: Treating RN: [name][birth year] (52 y.o. 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 - Dr. Lady Gary Rm 2 Return A Anesthetic (In clinic) Topical Lidocaine 5% applied to wound bed Bathing/ Shower/ Hygiene [name] shower and wash wound with soap and water. Edema Control - Lymphedema / SCD / Other Elevate legs to the level of the heart or above for [name] minutes daily and/or when sitting for 3-4 times a day throughout the day. Avoid standing for long periods of time. Exercise regularly Additional Orders / Instructions Follow Nutritious Diet Juven  Shake 1-2 times daily. Wound Treatment Wound #17 - Lower Leg Wound Laterality: Right, Lateral Cleanser: Soap and Water 1 x Per Week/[name] Days Discharge Instructions: [name] shower and wash wound with dial antibacterial soap and water prior  to dressing change. Cleanser: Wound Cleanser 1 x Per Week/[name] Days Discharge Instructions: Cleanse the wound with wound cleanser prior to applying a clean dressing using gauze sponges, not tissue or cotton balls. Peri-Wound Care: Triamcinolone 15 (g) 1 x Per Week/[name] Days Discharge Instructions: Use triamcinolone 15 (g) as directed Peri-Wound Care: Sween Lotion (Moisturizing lotion) 1 x Per Week/[name] Days Discharge Instructions: Apply moisturizing lotion as directed Prim Dressing: Santyl Ointment 1 x Per Week/[name] Days ary Discharge Instructions: Apply nickel thick amount to wound bed as instructed Secondary Dressing: ABD Pad, 5x9 1 x Per Week/[name] Days Discharge Instructions: Apply over primary dressing as directed. Secondary Dressing: Woven Gauze Sponge, Non-Sterile 4x4 in 1 x Per Week/[name] Days Discharge Instructions: Moisten with normal saline and Apply over primary dressing as directed. Secured With: Elastic Bandage 4 inch (ACE bandage) 1 x Per Week/[name] Days Discharge Instructions: Secure with ACE bandage as directed. MAKALEIGH, HAUSCH ([id number]) [id number] Page 8 of 18 Secured With: [employer], 4.5x3.1 (in/yd) 1 x Per Week/[name] Days Discharge Instructions: Secure with Kerlix as directed. Wound #21 - Ankle Wound Laterality: Right, Medial Cleanser: Soap and Water 1 x Per Week/[name] Days Discharge Instructions: [name] shower and wash wound with dial antibacterial soap and water prior to dressing change. Cleanser: Wound Cleanser 1 x Per Week/[name] Days Discharge Instructions: Cleanse the wound with wound cleanser prior to applying a clean dressing using gauze sponges, not tissue or cotton balls. Peri-Wound Care: Triamcinolone 15 (g) 1 x Per  Week/[name] Days Discharge Instructions: Use triamcinolone 15 (g) as directed Peri-Wound Care: Sween Lotion (Moisturizing lotion) 1 x Per Week/[name] Days Discharge Instructions: Apply moisturizing lotion as directed Prim Dressing: Santyl Ointment 1 x Per Week/[name] Days ary Discharge Instructions: Apply nickel thick amount to wound bed as instructed Secondary Dressing: ABD Pad, 5x9 1 x Per Week/[name] Days Discharge Instructions: Apply over primary dressing as directed. Secondary Dressing: Woven Gauze Sponge, Non-Sterile 4x4 in 1 x Per Week/[name] Days Discharge Instructions: Moisten with normal saline and Apply over primary dressing as directed. Secured With: Elastic Bandage 4 inch (ACE bandage) 1 x Per Week/[name] Days Discharge Instructions: Secure with ACE bandage as directed. Secured With: [employer], 4.5x3.1 (in/yd) 1 x Per Week/[name] Days Discharge Instructions: Secure with Kerlix as directed. Laboratory naerobe culture (MICRO) - PCR of nonhealing wound to right leg Bacteria identified in Unspecified specimen by A LOINC Code: 635-3 Convenience Name: Anaerobic culture Patient Medications llergies: No Known Allergies A Notifications Medication Indication Start End 01/01/2023 lidocaine DOSE topical 5 % ointment - ointment topical Electronic Signature(s) Signed: 01/01/2023 2:33:10 PM By: Duanne Guess MD FACS Entered By: Duanne Guess on 01/01/2023 [name]:15:29 -------------------------------------------------------------------------------- Problem List Details Patient Name: Date of Service: [name], FA [name] 01/01/2023 10:45 A M Medical Record Number: [id number] Patient Account Number: [id number] Date of Birth/Sex: Treating RN: [birth year], [name] (52 y.o. F) Primary Care Provider: Julianne Handler Other Clinician: Referring Provider: Treating Provider/Extender: Koleen Nimrod Weeks in Treatment: [address] Problems ICD-10 Encounter Bantam, [name] ([id number])  [id number] Page 9 of 18 Encounter Code Description Active Date MDM Diagnosis L97.818 Non-pressure chronic ulcer of other part of right lower leg with other specified [name]/29/2022 No Yes severity 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 [name]/29/2022 No Yes Inactive Problems ICD-10 Code Description Active Date Inactive Date L97.828 Non-pressure chronic ulcer of other part of left lower leg with other specified severity [name]/29/2022 [name]/29/2022 Resolved Problems ICD-10 Code Description Active Date Resolved Date L97.822 Non-pressure chronic ulcer of other part of left lower leg  with fat layer exposed 08/09/2022 08/09/2022 Electronic Signature(s) Signed: 01/01/2023 [name]:12:03 AM By: Duanne Guess MD FACS Entered By: Duanne Guess on 01/01/2023 [name]:12:02 -------------------------------------------------------------------------------- Progress Note Details Patient Name: Date of Service: [name], FA [name] 01/01/2023 10:45 A M Medical Record Number: [id number] Patient Account Number: [id number] Date of Birth/Sex: Treating RN: [birth year] (52 y.o. F) Primary Care Provider: Julianne Handler Other Clinician: Referring Provider: Treating Provider/Extender: Koleen Nimrod Weeks in Treatment: 66 Subjective Chief Complaint Information obtained from Patient the patient is here for evaluation of her bilateral lower extremity sickle cell ulcers [name]/29/2022; patient comes in for substantial wounds on the right and left lower leg History of Present Illness (HPI) The following HPI elements were documented for the patient's wound: Location: medial and lateral ankle region on the right and left medial malleolus Quality: Patient reports experiencing a shooting pain to affected area(s). Severity: Patient states wound(s) are getting worse. Duration: right lower extremity bimalleolar ulcers have been present for approximately 2 years; the right medial malleolus ulcer  has been there proximally 6 months Timing: Pain in wound is constant (hurts all the time) Context: The wound would happen gradually Associated Signs and Symptoms: Patient reports having increase discharge. 52 year old patient with a history of sickle cell anemia who was last seen by me with ulceration of the right lower extremity above the ankle and was referred to Dr. Marzetta Board for a surgical debridement as I was unable to do anything in the office due to excruciating pain. At that stage she was referred from the plastic surgery service to dermatology who treated her for a skin infection with doxycycline and then Levaquin and a local antibiotic ointment. I understand the patient has since developed ulceration on the left ankle both medial and lateral and was now referred back to the wound center as dermatology has finished the management. I do not have any notes from the dermatology department Old notes: 52 year old patient with a history of sickle cell anemia, pain bilateral lower extremities, right lower extremity ulcer and has a history of receiving a skin graft( Theraskin) several months ago. She has been visiting the wound center [facility] and was seen by Dr. Leanord Hawking and Dr. Marzetta Board. after prolonged conservator therapy between July 2016 and January 2017. She had been seen by the plastic surgeon and taken to the OR for debridement and application of Theraskin. She had 3 applications of Theraskin and was then treated with collagen. Prior to that she had a history of similar problems in 2014 and was treated conservatively. [name], ROLLIN ([id number]) [id number] Page 10 of 18 Had a reflux study done for the right lower extremity in August 2016 without reflux or DVT . Past medical history significant for sickle cell disease, anemia, leg ulcers, cholelithiasis,and has never been a smoker. Once the patient was discharged on the wound center she says within 2 or 3 weeks  the problems recurred and she has been treating it conservatively. since I saw her 3 weeks ago at [facility] she has been unable to get her dressing material but has completed a course of doxycycline. 6/7/ 2017 -- lower extremity venous duplex reflux evaluation was done  No evidence of SVT or DVT in the RLL. No venous incompetence in the RLL. No further vascular workup is indicated at this time. She was seen by Dr. Mina Marble, on 10/04/2015. She agreed with the plan of taking her to the OR for debridement and application of theraskin and would also take biopsies to rule out  pyoderma gangrenosum. Follow-up note dated [name] 31 received and she was status post application of Theraskin to multiple ulcers around the right ankle. Pathology did not show evidence of malignancy or pyoderma gangrenosum. She would continue to see as in the wound clinic for further care and see Dr. Marzetta Board as needed. The patient brought the biopsy report and it was consistent with stasis ulcer no evidence of malignancy and the comment was that there was some adjacent neovascularization, fibrosis and patchy perivascular chronic inflammation. 11/15/2015 -- today we applied her first application of Theraskin 11/30/15; TheraSkin #2 12/13/2015 -- she is having a lot of pain locally and is here for possible application of a theraskin today. 01/16/2016 -- the patient has significant pain and has noticed despite in spite of all local care and oral pain medication. It is impossible to debride her in the office. 02/06/2016 -- I do not see any notes from Dr. Leta Baptist( the patient has not made a call to the office know as she heard from them) and the only visit to recently was with her PCP Dr. Gypsy Decant -- I saw her on 01/16/2016 and prescribed 90 tablets of oxycodone 10 mg and did lab work and screening for HIV. the HIV was negative and hemoglobin was 6.3 with a WBC count of 14.9 and hematocrit of 17.8 with platelets of 561.  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 The bimalleolar ulcers to the right lower extremity have been present for approximately 2 years, the left medial malleolus ulcer has been present approximately 6 months. She has followed with Dr.Thimmappa in the past and has had a total of 3 applications of Theraskin (01/2015, 09/2015, 06/17/16). She has also followed with Dr. Meyer Russel here in the clinic and has received 2 applications of TheraSkin (11/10/15, 11/30/15). The patient does experience chronic, and is not amenable to debridement. 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 She is not currently on any antibiotic therapy and has not been treated with any recently. 07/17/2016 -- was seen by Dr. Leta Baptist of plastic surgery who saw her 2 weeks postop application of Theraskin #3. She had removed her dressing and asked her to apply silver alginate on alternate days and follow-up back with the wound center. Future debridements and application of skin substitute would have to be done in the hospital due to her high risk for anesthesia. READMISSION [name]/29/2022 Patient is now a 52 year old woman that we have had in this clinic for a prolonged period of time and 2016-2017 and then again for 2 visits in February 2018. At that point she had wounds on the right lower leg predominantly medial. She had also been seen by plastic surgery Dr. Marzetta Board who I believe took her to the OR for operative debridement and application of TheraSkin in 2017. After she left our clinic she was followed for a very prolonged period of time in the wound care center in [facility] who then referred her ultimately to [facility] where she was seen by Dr. Mardene Speak. Again taken her to the OR for skin grafting which apparently did not take. She had multiple other attempts at dressings although I  have not really looked over all of these notes in great detail. She has not been seen in a wound care center in about a year. She states over the last year in addition to her right  lower leg she has developed wounds on the left lower leg quite extensive. She is using Xeroform to all of these wounds without really any improvement. She also has Medicaid which does not cover wound products. The patient has had vascular work-ups in the past including most recently on [name]/01/2021 showing biphasic waveforms on the right triphasic at the PTA and biphasic at the dorsalis pedis on the left. She was unable to tolerate any degree of compression to do ABIs. Unfortunately TBI's were also not done. She had venous reflux studies done in 2017. This did not show any evidence of a DVT or SVT and no venous incompetence was noted in the right leg at the time this was the only side with the wound As noted I did not look all over her old records. She apparently had a course of HBO and Baptist although I am not sure what the indication would have been. In any case she developed seizures and terminated treatment earlier. She is generally much more disabled than when we last saw her in clinic. She can no longer walk pretty much wheelchair-bound because predominantly of pain in the left hip. 04/24/2021; the patient tolerated the wraps we put on. We used Santyl and Hydrofera Blue under compression. I brought her back for a nurse visit for a change in dressing. With Medicaid we will have a hard time getting anything paid for and hence the need for compression. She arrives in clinic with all the wounds looking somewhat better in terms of surface 12/20; circumferential wound on the right from the lateral to the medial. She has open areas on the left medial and left lateral x2 on all of this with the same surface. This does not look completely healthy although she does have some epithelialization. She is not complaining of a lot of  pain which is unusual for her sickle ulcers. I have not looked over her extensive records from [facility]. She had recent arterial studies and has a history of venous reflux studies I will need to look these over although I do not believe she has significant arterial disease 2023 05/22/2021; patient's wound areas measure slightly smaller. Still a lot of drainage coming from the right we have been using Hydrofera Blue and Santyl with some improvement in the wound surfaces. She tells me she will be getting transfused later in the week for her underlying sickle cell anemia I have looked over her recent arterial studies which were done in the fall. This was in November and showed biphasic and triphasic waveforms but she could not tolerate ABIs because of pressure and unfortunately TBI's were not done. She has not had recent venous reflux studies that I can see 1/10; not much change about the same surface area. This has a yellowish surface to it very gritty. We have been using Santyl and Hydrofera Blue for a prolonged period. Culture I did last week showed methicillin sensitive staph aureus "rare". Our intake nurse reports greenish drainage which [name] be the Hydrofera Blue itself 1/17; wounds are continue to measure smaller although I am not sure about the accuracy here. Especially the areas on the right are covered in what looks to be a nonviable surface although she does have some epithelialization. Similarly she has areas on the left medial and left lateral ankle area which appear to have a better surface and perhaps are slightly smaller. We have been using Santyl and Hydrofera Blue. She cannot tolerate mechanical debridements She went for her reflux studies which  showed significant reflux at the greater saphenous vein at the saphenofemoral junction as well as the greater saphenous vein in the proximal calf on the left she had reflux in the thigh and the common femoral vein and supra vein Fishel vein reflux  in the greater saphenous vein. I will have vein and vascular look at this. My thoughts have been that these are likely sickle wounds. I looked through her old records from [facility] wound care center and then when she graduated to [facility] wound care center where she saw Dr. Ronda Fairly and Dr. Mardene Speak. Although I can see she had reflux studies done I do not see that she actually saw a vein and vascular. I went over the fact that she had operative debridements and actual skin grafting that did not take. I do not think these wounds have ever really progressed towards healing 1/31;Substantial wounds on the right ankle area. Hyper granulated very gritty adherent debris on the surface. 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 On the left she has no DVT or SVT deep vein reflux in the common femoral vein. Superficial vein reflux in the greater saphenous vein on although the vein diameter is not really all that large. I do not think there is anything that can be done with these although I am going to send her for consultation to vein and vascular. 2/7; Wound exam; substantial wound area on the right posterior ankle area and areas on the left medial ankle and left lateral ankle. I was able to debride the left medial ankle last week fairly aggressively and it is back this week to a completely nonviable surface She will see vascular surgery this Friday and I would like them to review the venous studies and also any comments on her arterial status. If they do not see an  issue here I am going to refer her to plastic surgery for an operative debridement perhaps intraoperative ACell or Integra. Eventually she will require a deep tissue culture again 2/14; substantial wound area on the right posterior ankle, medial ankle. We have been using silver alginate The patient was seen by vein and vascular she had both venous reflux studies and arterial studies. 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 She had no evidence of superficial venous thrombosis. She did have superficial vein reflux at the saphenofemoral junction and the greater saphenous vein. On the left no evidence of a DVT no evidence of superficial venous throat thrombosis she did have deep vein reflux in the common femoral vein and superficial vein reflux in the greater saphenous vein but these were not felt to be amenable to ablation. In terms of arterial studies she had triphasic and wife biphasic wave waveforms bilaterally not felt to have a significant arterial issue. I do not get the feeling that they felt that any part of her nonhealing wounds were related to either arterial or venous issues. They did note that she had venous reflux at the right at the  SFJ and GCV. And also on the left there were reflux in the deep system at the common femoral vein and greater saphenous vein in the proximal thigh. Nothing amenable to ablation. 2/20; she is making some decent progress on the right where there is nice skin between the 2 open areas on the right ankle. The surfaces here do not look viable yet there is some surrounding epithelialization. She still has a small area on the left medial ankle area. Hyper-granulated [name]'s away always 2/28 patient has an appointment with plastic surgery on 3/8. We will see her back on 3/9. She [name] have to call us to get the area redressed. We've been using Santyl under silver alginate. We made a nice  improvement on the left medial ankle. The larger wounds on the right also looks somewhat better in terms of epithelialization although I think they could benefit from an aggressive debridement if plastic surgery would be willing to do that. Perhaps placement of Integra or a cell 07/26/2021: She saw Dr. Arita Miss yesterday. He raised the question as to whether or not this might be pyoderma and wanted to wait until that question was answered by dermatology before proceeding with any sort of operative debridement. We have continue to use Santyl under silver alginate with Kerlix and Coban wraps. Overall, her wounds appear to be continuing to contract and epithelialize, with some granulation tissue present. There continues to be some slough on all wound surfaces. 08/09/2021: She has not been able to get an appointment with dermatology because apparently the offices in [facility] and [place] do not accept Medicaid. She is looking into whether or not she can be seen at the main [facility] dermatology clinic. This is necessary because plastic surgery is concerned that her wounds might represent pyoderma and they did not want to do any procedure until that was clarified. We have been using Santyl under silver alginate with Kerlix and Coban wraps. Today, there was a greater amount of drainage on her dressings with a slight green discoloration and significant odor. Despite this, her wounds continue to contract and epithelialize. There is pale granulation tissue present and actually, on the left medial ankle, the granulation tissue is a bit hypertrophic. 3/[name]/2023: Last week, I took a culture and this grew back rare methicillin-resistant Staph aureus and rare corynebacterium. The MRSA was sensitive to gentamicin which we began applying topically on an empiric basis. This week, her wounds are a bit smaller and the drainage and odor are less. Her primary care provider is working on assisting the patient with a  dermatology evaluation. She has been in silver alginate over the gentamicin that was started last week along with Kerlix and Coban wraps. 08/23/2021: Because she has Medicaid, we have been unable to get her into see any dermatologist in the [place] to rule out pyoderma gangrenosum, which was a requirement from plastic surgery prior to any sort of debridement and grafting. Despite this, however, all of her wounds continue to get smaller. The wound on her left medial ankle is nearly closed. There is no odor from the wounds, although she still accumulates a modest amount of drainage on her dressings. 08/30/2021: The lateral right ankle wound and the medial left ankle wound are a bit smaller today. The medial right ankle wound is about the same size. They are less tender. We have still been unable to get her into dermatology. 09/06/2021: All of the wounds are about the same size today. She continues to endorse minimal  pain. I communicated with Dr. Arita Miss in plastic surgery regarding our issues getting a dermatology appointment; he was out of town but indicated that he would look into perhaps performing the biopsy in his office and will have his office contact her. 09/14/2021: The patient has an appointment in dermatology, but it is not until October. Her wounds are roughly the same; she continues to have very thick purulent-looking drainage on her dressings. 09/20/2021: The left medial wound is nearly closed and just has a bit of accumulated eschar on the surface. The right medial and lateral ankle wounds are perhaps a little bit smaller. They continue to have a very pale surface with accumulation of thin slough. 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 I did not think Jodie [name] was indicated based on this. She is getting topical mupirocin with Prisma silver collagen. 10/04/2021: The patient was not seen in clinic last week due to childcare coverage issues. In the interim, the left medial  leg wound has closed. The right sided leg wounds are smaller. There is more granulation tissue coming through, particularly on the lateral wound. The surface remains somewhat gritty. We have been applying topical mupirocin and Prisma silver collagen. 10/11/2021: The left medial leg wound remains closed. She does complain of some anesthetic sensation to the area. Both of the right-sided leg wounds are smaller but still have accumulated slough. 10/18/2021: Both right-sided leg wounds are minimally smaller this week. She still continues to accumulate slough and has thick drainage on her dressings. 10/23/2021: Both wounds continue to contract. There is still slough buildup. She has been approved for a keratin-based skin substitute trial product but it will not be available until next week. 10/30/2021: The wounds are about the same to perhaps slightly smaller. There is still continued slough buildup. Unfortunately, the rep for the keratin based product did not show up today and did not answer his phone when called. 11/08/2021: The wounds are little bit smaller today. She continues to have thick drainage but the surfaces are relatively clean with just a little bit of slough accumulation. She reported to me today that she is unable to completely flex her left ankle and on examination it seems this is potentially related to scar tissue from her wounds. We do have the ProgenaMatrix trial product available for her today. 11/15/2021: Both wounds are smaller today. There is some slough accumulation on the surfaces, but the medial wound, in particular looks like it is filling in and is less deep. She did hear from physical therapy and she is going to start working with them on July [name]. She is here for her second application of the trial skin substitute, ProgenaMatrix. 11/22/2021: Both wounds continue to contract, the medial more dramatically than the lateral. Both wounds have a layer of slough on the surface, but  underneath this, the gritty fibrous tissue has a little bit more of a pink cast to it rather than being as pale as it has been. 11/29/2021: The wounds are roughly the same size this week, perhaps a millimeter or 2 smaller. The medial wound has filled in and is nearly flush with the [place], [name] ([id number]) [id number] Page 12 of 18 surrounding skin surface. She continues to have a lot of slough accumulation on both surfaces. 12/06/2021: No significant change to her wounds, but she has a new opening on her dorsal foot, just distal to the right lateral ankle wound. The area on her left medial ankle that reopened looks a  little bit larger today. She has quite a bit of pain associated with the new wound. 12/12/2021: Her wounds look about the same but the new opening on her right lateral dorsal foot is a little bit bigger. She continues to have a fair amount of pain with this wound. 12/26/2021: The left medial ankle wound is tiny and superficial. She has 2 areas of crusting on her left lateral ankle, however, that appear to be threatening to open again. Her right medial ankle wound is a little bit smaller today but still continues to accumulate thick rubbery slough. The new dorsal foot wound is exquisitely painful but there is no odor or purulent drainage. No erythema or induration. The right lateral ankle wound looks about the same today, again with thick rubbery slough. 01/03/2022: The left medial ankle wound has closed again. Both right ankle wounds appear to be about the same size with thick rubbery slough. The dorsal foot wound on the right continues to be quite painful and she stated that she did not want any debridement of that site today. 01/10/2022: No real change to any of her wounds. She continues to accumulate thick slough. The dorsal foot wound has merged with the lateral malleolar wound. She is experiencing significant pain in the dorsal foot portion of the  ulcer. 8/[name]/2023: Absolutely no change or progress in her wounds. 01/24/2022: Her wounds are unchanged. She continues to build up slough and the wounds on her dorsal right foot are still exquisitely tender. 01/31/2022: The wounds actually measure a little bit narrower today. They still have thick slough on the surface but the underlying tissue seems a little less fibrotic. We changed to Iodosorb last week. 02/07/2022: Wounds continue to slowly epithelialize around the parameters. She has less pain today. She still accumulates a fairly substantial layer of slough. 02/15/2022: No change to the wounds overall. The measured a little bit larger today per the intake nurse. She continues to have substantial slough accumulation and her pain is a little bit worse. 02/21/2022. No change at all to her wounds. I do not see that plastic surgery has received her referral yet. 02/28/2022: The wounds remain unchanged. They are dry and fibrotic with accumulation of slough and eschar. She did receive an appointment to see plastic surgery on October 23, but the office called her back and indicated they needed to reschedule and that the next available appointment was not until December. The patient became angry and decided she did not want to see this plastics group. 10/19; the patient sees Dr. Maylene Roes again next week. She is using Medihoney as the primary dressing changing this herself. [name]/06/2021: She saw Dr. Ferd Hibbs at the wound care center at [facility]. Dr. Ferd Hibbs was also unable to debride her, secondary to pain and is planning to take her to the operating room for operative debridement and potential skin substitute placement. Her wounds are unchanged with thick yellow slough and a fibrotic base. She says they are too painful to be debrided. In addition, yesterday her hemoglobin was 4 and she received 2 units packed red blood cell transfusion. [name]/16/2023: Her operation at [facility] is scheduled  for December 15. She continues to use Medihoney on her wounds. They are a little bit less painful today and she is willing to entertain the possibility of debridement. The wounds measured a little bit larger in all dimensions today but the layer of slough is not as thick as usual. [name]/[name]/2023: Her wounds actually measured a little  bit smaller today and the extension onto the dorsal foot from the lateral wound has healed. They are less tender and there is significantly less slough present as compared to prior visits. 05/09/2022: She had to reschedule her surgery due to lack of childcare. It is now planned for January 5. The wounds are basically unchanged, but she had a lot more drainage, which was thick and somewhat purulent in appearance. No significant odor. 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 They are quite painful today and she does not want to have debridement performed. 1/[name]/2024: The patient underwent surgical debridement and placement of TheraSkin to her wounds on January 5. She has been doing well and does not endorse any pain. The TheraSkin is intact and looks beautiful. It is sutured in place. There is no exudate or significant drainage. 06/04/2022: Her TheraSkin remains intact and I am starting to see granulation tissue emerging underneath the surface. No concern for infection. 06/12/2022: The TheraSkin is intact and there is more granulation tissue emerging. It is starting to look a little bit dry, however. No malodor or purulent drainage. 06/19/2022: The TheraSkin continues to look good. The edges are a bit dry, but the central portion of each of her wounds is showing a nice pink color. 06/27/2022: The TheraSkin on the lateral wound remains almost completely intact. There is nice pink tissue peaking through the mesh of the TheraSkin. On the medial leg, it is starting to breakdown a little bit, but most of the TheraSkin remains intact here, as well. Both wounds measured smaller  today. 07/03/2022: The wounds are fairly dry around the perimeter today. Some of the suture is hanging loose. 07/12/2022: Both wounds are measuring smaller today. The medial ulcer is getting a bit too dry. The lateral ulcer continues to have nice buds of granulation tissue emerging. 07/26/2022: The moisture balance on both wounds is much better today. For the first time since I began seeing her, I see actual beefy red granulation tissue on the lateral ankle wound. There are more buds of deep pink granulation tissue emerging on the medial ankle. There is some dry eschar around the edges of the medial wound and a tiny amount of slough overlying the good granulation tissue on the lateral wound. 08/09/2022: Unfortunately, she has a new wound on her left lateral lower leg, just above the ankle. It is extremely painful for her. It penetrates to the fat layer and has the same woody, fibrotic character as her previous wounds. The other wounds are both a little bit smaller with slough and some eschar around the periphery. 08/14/2022: Her right lateral ankle wound is a little bit smaller. Unfortunately, she has had an increase in her drainage and it has a purulent nature to it, similar to what was present prior to her surgical debridement. She also reports odor coming from her wounds this week. The left lateral leg wound remains extremely tender. 08/21/2022: The left lateral ankle wound is larger and more painful today. Both right ankle wounds have a little bit more vital appearance, but they have accumulated more slough. She reports less odor this week. She is currently taking Augmentin. 08/28/2022: The left lateral ankle wound continues to enlarge and remains quite painful. Both of the right ankle wounds actually look better this week. There is improved tissue quality with less slough accumulation. 09/27/2022: The left lateral ankle wound actually looks nearly healed. She says that she has been applying some silver  alginate that she had leftover. She was  unable to afford the Santyl that was prescribed, but found a tube that she had on hand at home and has been using this on her right ankle wounds. These TRUDITH, FORK ([id number]) [id number] Page 13 of 18 wounds look about the same and have accumulated the usual layer of slough. Unfortunately, a portion of the dorsal foot wound has reopened and is exquisitely painful today. 10/09/2022: The left lateral ankle wound is healed. The dorsal foot wound appears to have expanded and remains exquisitely painful. The medial and lateral right ankle wounds look about the same size, but they are quite a bit cleaner. 10/23/2022: The left lateral ankle remains closed. The dorsal foot wound has expanded to the point that it now converges with the right lateral leg wound. She has accumulated fairly thick slough on all of the open surfaces. 11/06/2022: No significant change to any of her wounds, although the dorsal foot aspect of the right lateral wound is a little bit smaller. She has been approved for the [employer] and [employer] for [employer] and will be able to get a full year's supply. 11/20/2022: The dorsal foot aspect of the right lateral wound has almost completely closed. I am seeing nice pink tissue starting to emerge from the fibrotic surface of both wounds. The medial leg wound appears to be a little bit smaller today. She has been using Santyl. 12/04/2022: The dorsal foot aspect of the right lateral wound has closed. There is more pink granulation tissue emerging on her wounds. There are buds of epithelium beginning to fill in from the edges of her wound, particularly at the proximal aspect of the medial wound and the distal aspect of the lateral wound. Significantly less slough formation than in the past. 12/18/2022: The wounds measured about the same size today, but there is better granulation tissue filling in and the overall surface  consistency is improving. She does have slough accumulation on the surfaces, but no malodor or purulent drainage. 01/01/2023: No significant change to her wounds. The surface seems a little bit more fibrotic today. She is complaining of more pain. Patient History Information obtained from Patient. Family History Diabetes - Mother, Lung Disease - Mother, No family history of Cancer, Heart Disease, Hereditary Spherocytosis, Hypertension, Kidney Disease, Seizures, Stroke, Thyroid Problems, Tuberculosis. Social History Never smoker, Marital Status - Married, Alcohol Use - Never, Drug Use - No History, Caffeine Use - Daily. Medical History Eyes Denies history of Cataracts, Glaucoma, Optic Neuritis Ear/Nose/Mouth/Throat Denies history of Chronic sinus problems/congestion, Middle ear problems Hematologic/Lymphatic Patient has history of Anemia, Sickle Cell Disease Denies history of Hemophilia, Human Immunodeficiency Virus, Lymphedema Respiratory Denies history of Aspiration, Asthma, Chronic Obstructive Pulmonary Disease (COPD), Pneumothorax, Sleep Apnea, Tuberculosis Cardiovascular Denies history of Angina, Arrhythmia, Congestive Heart Failure, Coronary Artery Disease, Deep Vein Thrombosis, Hypertension, Hypotension, Myocardial Infarction, Peripheral Arterial Disease, Peripheral Venous Disease, Phlebitis, Vasculitis Gastrointestinal Denies history of Cirrhosis , Colitis, Crohns, Hepatitis A, Hepatitis B, Hepatitis C Endocrine Denies history of Type I Diabetes, Type II Diabetes Genitourinary Denies history of End Stage Renal Disease Immunological Denies history of Lupus Erythematosus, Raynauds, Scleroderma Integumentary (Skin) Denies history of History of Burn Musculoskeletal Denies history of Gout, Rheumatoid Arthritis, Osteoarthritis, Osteomyelitis Neurologic Patient has history of Neuropathy - right foot intermittant Denies history of Dementia, Quadriplegia, Paraplegia, Seizure  Disorder Oncologic Denies history of Received Chemotherapy, Received Radiation Psychiatric Denies history of Anorexia/bulimia, Confinement Anxiety Hospitalization/Surgery History - c section x2. - left breast lumpectomy. - iandD right ankle with theraskin. Medical  A Surgical History Notes nd Constitutional Symptoms (General Health) H/O miscarriage Cardiovascular bradycardia Gastrointestinal cholilithiasis Objective Constitutional [name], [name] ([id number]) [id number] Page 14 of 18 no acute distress. Vitals Time Taken: 10:[name] AM, Height: 67 in, Weight: 134 lbs, BMI: 21, Temperature: 98.0 F, Pulse: 81 bpm, Respiratory Rate: 18 breaths/min, Blood Pressure: 115/70 mmHg. Respiratory Normal work of breathing on room air. General Notes: 01/01/2023: No significant change to her wounds. The surface seems a little bit more fibrotic today. She is complaining of more pain. Integumentary (Hair, Skin) Wound #17 status is Open. Original cause of wound was Gradually Appeared. The date acquired was: 10/05/2012. The wound has been in treatment 89 weeks. The wound is located on the Right,Lateral Lower Leg. The wound measures 9cm length x 6cm width x 0.1cm depth; 42.412cm^2 area and 4.241cm^3 volume. There is Fat Layer (Subcutaneous Tissue) exposed. There is no tunneling or undermining noted. There is a medium amount of serosanguineous drainage noted. The wound margin is distinct with the outline attached to the wound base. There is small (1-33%) pink granulation within the wound bed. There is a large (67- 100%) amount of necrotic tissue within the wound bed including Adherent Slough. The periwound skin appearance had no abnormalities noted for moisture. The periwound skin appearance exhibited: Scarring, Hemosiderin Staining. The periwound skin appearance did not exhibit: Ecchymosis. Periwound temperature was noted as No Abnormality. The periwound has tenderness on  palpation. Wound #21 status is Open. Original cause of wound was Gradually Appeared. The date acquired was: 06/26/2021. The wound has been in treatment 79 weeks. The wound is located on the Right,Medial Ankle. The wound measures 9.3cm length x 4.5cm width x 0.1cm depth; 32.869cm^2 area and 3.287cm^3 volume. There is Fat Layer (Subcutaneous Tissue) exposed. There is no tunneling or undermining noted. There is a medium amount of serosanguineous drainage noted. The wound margin is distinct with the outline attached to the wound base. There is small (1-33%) pink granulation within the wound bed. There is a large (67- 100%) amount of necrotic tissue within the wound bed including Adherent Slough. The periwound skin appearance had no abnormalities noted for moisture. The periwound skin appearance had no abnormalities noted for color. The periwound skin appearance exhibited: Scarring. Periwound temperature was noted as No Abnormality. The periwound has tenderness on palpation. 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 There was a Selective/Open Wound Non-Viable Tissue Debridement with a total area of 33.91 sq cm performed by Duanne Guess, MD. With the following instrument(s): Curette to remove Non-Viable tissue/material. Material removed includes [facility] after achieving pain control using Lidocaine 5% topical ointment. No specimens were taken. A time out was conducted at 10:53, prior to the start of the procedure. A Minimum amount of bleeding was controlled with Pressure. The procedure was tolerated well. Post Debridement Measurements: 9cm length x 6cm width x 0.1cm depth; 4.241cm^3 volume. Character of Wound/Ulcer Post Debridement is improved. Post procedure  Diagnosis Wound #17: Same as Pre-Procedure General Notes: scribed for Dr. Lady Gary by Samuella Bruin, RN. Wound #21 Pre-procedure diagnosis of Wound #21 is a Sickle Cell Lesion located on the Right,Medial Ankle .Severity of Tissue Pre Debridement is: Fat layer exposed. There was a Selective/Open Wound Non-Viable Tissue Debridement with a total area of 24.64 sq cm performed by Duanne Guess, MD. With the following  instrument(s): Curette to remove Non-Viable tissue/material. Material removed includes Slough after achieving pain control using Lidocaine 5% topical ointment. No specimens were taken. A time out was conducted at 10:53, prior to the start of the procedure. A Minimum amount of bleeding was controlled with Pressure. The procedure was tolerated well. Post Debridement Measurements: 9.3cm length x 4.5cm width x 0.1cm depth; 3.287cm^3 volume. Character of Wound/Ulcer Post Debridement is improved. Severity of Tissue Post Debridement is: Fat layer exposed. Post procedure Diagnosis Wound #21: Same as Pre-Procedure General Notes: scribed for Dr. Lady Gary by Samuella Bruin, RN. Plan Follow-up Appointments: Return Appointment in 2 weeks. - Dr. Lady Gary Rm 2 Anesthetic: (In clinic) Topical Lidocaine 5% applied to wound bed Bathing/ Shower/ Hygiene: [name] shower and wash wound with soap and water. Edema Control - Lymphedema / SCD / Other: Elevate legs to the level of the heart or above for [name] minutes daily and/or when sitting for 3-4 times a day throughout the day. Avoid standing for long periods of time. Exercise regularly Additional Orders / Instructions: Follow Nutritious Diet Juven Shake 1-2 times daily. Laboratory ordered were: Anaerobic culture - PCR of nonhealing wound to right leg [place], [name] ([id number]) [id number] Page 15 of 18 The following medication(s) was prescribed: lidocaine topical 5 % ointment ointment topical was prescribed at facility WOUND  #17: - Lower Leg Wound Laterality: Right, Lateral Cleanser: Soap and Water 1 x Per Week/[name] Days Discharge Instructions: [name] shower and wash wound with dial antibacterial soap and water prior to dressing change. Cleanser: Wound Cleanser 1 x Per Week/[name] Days Discharge Instructions: Cleanse the wound with wound cleanser prior to applying a clean dressing using gauze sponges, not tissue or cotton balls. Peri-Wound Care: Triamcinolone 15 (g) 1 x Per Week/[name] Days Discharge Instructions: Use triamcinolone 15 (g) as directed Peri-Wound Care: Sween Lotion (Moisturizing lotion) 1 x Per Week/[name] Days Discharge Instructions: Apply moisturizing lotion as directed Prim Dressing: Santyl Ointment 1 x Per Week/[name] Days ary Discharge Instructions: Apply nickel thick amount to wound bed as instructed Secondary Dressing: ABD Pad, 5x9 1 x Per Week/[name] Days Discharge Instructions: Apply over primary dressing as directed. Secondary Dressing: Woven Gauze Sponge, Non-Sterile 4x4 in 1 x Per Week/[name] Days Discharge Instructions: Moisten with normal saline and Apply over primary dressing as directed. Secured With: Elastic Bandage 4 inch (ACE bandage) 1 x Per Week/[name] Days Discharge Instructions: Secure with ACE bandage as directed. Secured With: [employer], 4.5x3.1 (in/yd) 1 x Per Week/[name] Days Discharge Instructions: Secure with Kerlix as directed. WOUND #21: - Ankle Wound Laterality: Right, Medial Cleanser: Soap and Water 1 x Per Week/[name] Days Discharge Instructions: [name] shower and wash wound with dial antibacterial soap and water prior to dressing change. Cleanser: Wound Cleanser 1 x Per Week/[name] Days Discharge Instructions: Cleanse the wound with wound cleanser prior to applying a clean dressing using gauze sponges, not tissue or cotton balls. Peri-Wound Care: Triamcinolone 15 (g) 1 x Per Week/[name] Days Discharge Instructions: Use triamcinolone 15 (g) as directed Peri-Wound Care: Sween Lotion (Moisturizing lotion)  1 x Per Week/[name] Days Discharge Instructions: Apply moisturizing lotion as directed Prim Dressing: Santyl Ointment 1 x Per Week/[name] Days ary Discharge Instructions: Apply nickel thick amount to wound bed as instructed Secondary Dressing: ABD Pad, 5x9 1 x Per Week/[name] Days Discharge Instructions: Apply over primary dressing as directed. Secondary Dressing: Woven Gauze Sponge, Non-Sterile 4x4 in 1 x Per Week/[name] Days Discharge Instructions: Moisten with normal saline and Apply over primary dressing as directed. Secured With: Copywriter, advertising  Bandage 4 inch (ACE bandage) 1 x Per Week/[name] Days Discharge Instructions: Secure with ACE bandage as directed. Secured With: [employer], 4.5x3.1 (in/yd) 1 x Per Week/[name] Days Discharge Instructions: Secure with Kerlix as directed. 01/01/2023: No significant change to her wounds. The surface seems a little bit more fibrotic today. She is complaining of more pain. I used a curette to debride slough off of the wound surfaces. Due to pain, I was only able to debride about 75% of the medial ulcer; I was able to debride 100% of the lateral leg ulcer. I did take a culture of the right leg ulcer, due to the patient's reported increasing pain. Once culture data are available, I will make a decision regarding any antibiotic intervention. We will continue Santyl with saline moistened gauze and Kerlix wrap. Follow-up in 2 weeks. Electronic Signature(s) Signed: 01/01/2023 [name]:17:12 AM By: Duanne Guess MD FACS Entered By: Duanne Guess on 01/01/2023 [name]:17:12 -------------------------------------------------------------------------------- HxROS Details Patient Name: Date of Service: [name], FA [name] 01/01/2023 10:45 A M Medical Record Number: [id number] Patient Account Number: [id number] Date of Birth/Sex: Treating RN: [birth year][name] ([name] y.o. F) Primary Care Provider: Julianne Handler Other Clinician: Referring Provider: Treating Provider/Extender: Koleen Nimrod Weeks in Treatment: 72 Information Obtained From Patient Constitutional Symptoms (General Health) Medical History: Past Medical History Notes: H/O miscarriage Eyes JOVINA, MOMPREMIER ([id number]) [id number] Page 16 of 18 Medical History: Negative for: Cataracts; Glaucoma; Optic Neuritis Ear/Nose/Mouth/Throat Medical History: Negative for: Chronic sinus problems/congestion; Middle ear problems Hematologic/Lymphatic Medical History: Positive for: Anemia; Sickle Cell Disease Negative for: Hemophilia; Human Immunodeficiency Virus; Lymphedema Respiratory Medical History: Negative for: Aspiration; Asthma; Chronic Obstructive Pulmonary Disease (COPD); Pneumothorax; Sleep Apnea; Tuberculosis Cardiovascular Medical History: Negative for: Angina; Arrhythmia; Congestive Heart Failure; Coronary Artery Disease; Deep Vein Thrombosis; Hypertension; Hypotension; Myocardial Infarction; Peripheral Arterial Disease; Peripheral Venous Disease; Phlebitis; Vasculitis Past Medical History Notes: bradycardia Gastrointestinal Medical History: Negative for: Cirrhosis ; Colitis; Crohns; Hepatitis A; Hepatitis B; Hepatitis C Past Medical History Notes: cholilithiasis Endocrine Medical History: Negative for: Type I Diabetes; Type II Diabetes Genitourinary Medical History: Negative for: End Stage Renal Disease Immunological Medical History: Negative for: Lupus Erythematosus; Raynauds; Scleroderma Integumentary (Skin) Medical History: Negative for: History of Burn Musculoskeletal Medical History: Negative for: Gout; Rheumatoid Arthritis; Osteoarthritis; Osteomyelitis Neurologic Medical History: Positive for: Neuropathy - right foot intermittant Negative for: Dementia; Quadriplegia; Paraplegia; Seizure Disorder Oncologic Medical History: Negative for: Received Chemotherapy; Received Radiation Psychiatric Medical History: Negative for: Anorexia/bulimia;  Confinement Anxiety Immunizations Pneumococcal Vaccine: Received Pneumococcal Vaccination: No [name] ([id number]) [id number] Page 17 of 18 Implantable Devices None Hospitalization / Surgery History Type of Hospitalization/Surgery c section x2 left breast lumpectomy iandD right ankle with theraskin Family and Social History Cancer: No; Diabetes: Yes - Mother; Heart Disease: No; Hereditary Spherocytosis: No; Hypertension: No; Kidney Disease: No; Lung Disease: Yes - Mother; Seizures: No; Stroke: No; Thyroid Problems: No; Tuberculosis: No; Never smoker; Marital Status - Married; Alcohol Use: Never; Drug Use: No History; Caffeine Use: Daily; Financial Concerns: No; Food, Clothing or Shelter Needs: No; Support System Lacking: No; Transportation Concerns: No Electronic Signature(s) Signed: 01/01/2023 2:33:10 PM By: Duanne Guess MD FACS Entered By: Duanne Guess on 01/01/2023 [name]:14:12 -------------------------------------------------------------------------------- SuperBill Details Patient Name: Date of Service: [name], FA [name] 01/01/2023 Medical Record Number: [id number] Patient Account Number: [id number] Date of Birth/Sex: Treating RN: [birth year] [name] (52 y.o. F) Primary Care Provider: Julianne Handler Other Clinician: Referring Provider: Treating Provider/Extender: Koleen Nimrod Weeks in Treatment: 89 Diagnosis Coding  ICD-10 Codes Code Description L97.818 Non-pressure chronic ulcer of other part of right lower leg with other specified severity 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 Facility Procedures : CPT4 Code: [id number] Description: [id number] - DEBRIDE WOUND 1ST 20 SQ CM OR < ICD-10 Diagnosis Description L97.818 Non-pressure chronic ulcer of other part of right lower leg with other specified s Modifier: everity Quantity: 1 : CPT4 Code: [id number] Description: 78469 - DEBRIDE WOUND EA ADDL 20 SQ CM ICD-10 Diagnosis  Description L97.818 Non-pressure chronic ulcer of other part of right lower leg with other specified s Modifier: everity Quantity: 2 Physician Procedures : CPT4 Code Description Modifier [id number] - WC PHYS LEVEL 4 - EST PT 25 ICD-10 Diagnosis Description L97.818 Non-pressure chronic ulcer of other part of right lower leg with other specified severity 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

## 2023-01-02 NOTE — Progress Notes (Signed)
GERALDYN, AZIZ ([id number]) [id number] Page 1 of 9 Visit Report for 01/01/2023 Arrival Information Details Patient Name: Date of Service: [name] [name] 01/01/2023 10:45 A M Medical Record Number: [id number] Patient Account Number: [id number] Date of Birth/Sex: Treating RN: 1970-12-19 (52 y.o. F) Primary Care Claritza July: Julianne Handler Other Clinician: Referring Kendyn Zaman: Treating Heitor Steinhoff/Extender: Koleen Nimrod Weeks in Treatment: 38 Visit Information History Since Last Visit All ordered tests and consults were completed: No Patient Arrived: [name] Added or deleted any medications: No Arrival Time: 10:29 Any new allergies or adverse reactions: No Accompanied By: sister Had a fall or experienced change in No Transfer Assistance: None activities of daily living that may affect Patient Identification Verified: Yes risk of falls: Secondary Verification Process Completed: Yes Signs or symptoms of abuse/neglect since last visito No Patient Requires Transmission-Based Precautions: No Hospitalized since last visit: No Patient Has Alerts: No Implantable device outside of the clinic excluding No cellular tissue based products placed in the center since last visit: Pain Present Now: Yes Electronic Signature(s) Signed: 01/01/2023 2:31:22 PM By: Dayton Scrape Entered By: Dayton Scrape on 01/01/2023 10:29:58 -------------------------------------------------------------------------------- Encounter Discharge Information Details Patient Name: Date of Service: [name], [name] 01/01/2023 10:45 A M Medical Record Number: [id number] Patient Account Number: [id number] Date of Birth/Sex: Treating RN: [birth year] (52 y.o. [name] Primary Care Yoni Lobos: Julianne Handler Other Clinician: Referring Desiray Orchard: Treating Tzivia Oneil/Extender: Koleen Nimrod Weeks in Treatment: 66 Encounter Discharge Information Items Post Procedure  Vitals Discharge Condition: Stable Temperature (F): 98 Ambulatory Status: Cane Pulse (bpm): 81 Discharge Destination: Home Respiratory Rate (breaths/min): 18 Transportation: Private Auto Blood Pressure (mmHg): 115/70 Accompanied By: sister Schedule Follow-up Appointment: Yes Clinical Summary of Care: Patient Declined Electronic Signature(s) Signed: 01/01/2023 4:38:35 PM By: Samuella Bruin Entered By: Samuella Bruin on 01/01/2023 14:01:38 [name] ([id number]) [id number] Page 2 of 9 -------------------------------------------------------------------------------- Lower Extremity Assessment Details Patient Name: Date of Service: [name] [name] 01/01/2023 10:45 A M Medical Record Number: [id number] Patient Account Number: [id number] Date of Birth/Sex: Treating RN: December 05, 1970 (52 y.o. [name] Primary Care Gwynneth Fabio: Julianne Handler Other Clinician: Referring Javel Hersh: Treating Jerusalem Brownstein/Extender: Koleen Nimrod Weeks in Treatment: 89 Edema Assessment Assessed: [Left: No] [Right: No] Edema: [Left: N] [Right: o] Calf Left: Right: Point of Measurement: 33 cm From Medial Instep 31 cm Ankle Left: Right: Point of Measurement: 10 cm From Medial Instep 20.5 cm Vascular Assessment Pulses: Dorsalis Pedis Palpable: [Right:Yes] Electronic Signature(s) Signed: 01/01/2023 4:38:35 PM By: Samuella Bruin Entered By: Samuella Bruin on 01/01/2023 10:40:53 -------------------------------------------------------------------------------- Multi Wound Chart Details Patient Name: Date of Service: [name], [name] 01/01/2023 10:45 A M Medical Record Number: [id number] Patient Account Number: [id number] Date of Birth/Sex: Treating RN: 1971-04-04 (52 y.o. F) Primary Care Alessander Sikorski: Julianne Handler Other Clinician: Referring Dae Antonucci: Treating Franny Selvage/Extender: Koleen Nimrod Weeks in Treatment:  22 Vital Signs Height(in): 67 Pulse(bpm): 81 Weight(lbs): 134 Blood Pressure(mmHg): 115/70 Body Mass Index(BMI): 21 Temperature(F): 98.0 Respiratory Rate(breaths/min): 18 [17:Photos:] [N/A:N/A [id number] Page 3 of 9] Right, Lateral Lower Leg Right, Medial Ankle N/A Wound Location: Gradually Appeared Gradually Appeared N/A Wounding Event: Sickle Cell Lesion Sickle Cell Lesion N/A Primary Etiology: N/A Venous Leg Ulcer N/A Secondary Etiology: Anemia, Sickle Cell Disease, Anemia, Sickle Cell Disease, N/A Comorbid History: Neuropathy Neuropathy 10/05/2012 06/26/2021 N/A Date Acquired: 62 79 N/A Weeks of Treatment: Open Open N/A Wound Status: No No N/A Wound Recurrence: Yes No N/A Clustered Wound: 9x6x0.1 9.3x4.5x0.1 N/A Measurements L x  W x D (cm) 42.412 32.869 N/A A (cm) : rea 4.241 3.287 N/A Volume (cm) : 77.40% -13.40% N/A % Reduction in A rea: 77.40% -13.40% N/A % Reduction in Volume: Full Thickness Without Exposed Full Thickness Without Exposed N/A Classification: Support Structures Support Structures Medium Medium N/A Exudate A mount: Serosanguineous Serosanguineous N/A Exudate Type: red, brown red, brown N/A Exudate Color: Distinct, outline attached Distinct, outline attached N/A Wound Margin: Small (1-33%) Small (1-33%) N/A Granulation A mount: Pink Pink N/A Granulation Quality: Large (67-100%) Large (67-100%) N/A Necrotic A mount: Fat Layer (Subcutaneous Tissue): Yes Fat Layer (Subcutaneous Tissue): Yes N/A Exposed Structures: Fascia: No Fascia: No Tendon: No Tendon: No Muscle: No Muscle: No Joint: No Joint: No Bone: No Bone: No Small (1-33%) Small (1-33%) N/A Epithelialization: Debridement - Selective/Open Wound Debridement - Selective/Open Wound N/A Debridement: Pre-procedure Verification/Time Out 10:53 10:53 N/A Taken: Lidocaine 5% topical ointment Lidocaine 5% topical ointment N/A Pain Control: [facility] N/A Tissue Debrided: Non-Viable Tissue Non-Viable Tissue N/A Level: 33.91 24.64 N/A Debridement A (sq cm): rea Curette Curette N/A Instrument: Minimum Minimum N/A Bleeding: Pressure Pressure N/A Hemostasis A chieved: Procedure was tolerated well Procedure was tolerated well N/A Debridement Treatment Response: 9x6x0.1 9.3x4.5x0.1 N/A Post Debridement Measurements L x W x D (cm) 4.241 3.287 N/A Post Debridement Volume: (cm) Scarring: Yes Scarring: Yes N/A Periwound Skin Texture: No Abnormalities Noted No Abnormalities Noted N/A Periwound Skin Moisture: Hemosiderin Staining: Yes No Abnormalities Noted N/A Periwound Skin Color: Ecchymosis: No No Abnormality No Abnormality N/A Temperature: Yes Yes N/A Tenderness on Palpation: Debridement Debridement N/A Procedures Performed: Treatment Notes Electronic Signature(s) Signed: 01/01/2023 11:12:27 AM By: Duanne Guess MD FACS Entered By: Duanne Guess on 01/01/2023 11:12:27 -------------------------------------------------------------------------------- Multi-Disciplinary Care Plan Details Patient Name: Date of Service: [name], [name] 01/01/2023 10:45 A M Medical Record Number: [id number] Patient Account Number: [id number] Date of Birth/Sex: Treating RN: [birth year] (52 y.o. [name] Primary Care Yilin Weedon: Julianne Handler Other Clinician: Referring Elizabeth Paulsen: Treating Jolly Carlini/Extender: Koleen Nimrod Weeks in Treatment: 60 Multidisciplinary Care Plan reviewed with physician ILIANA, THEIL ([id number]) [id number] Page 4 of 9 Active Inactive Necrotic Tissue Nursing Diagnoses: Impaired tissue integrity related to necrotic/devitalized tissue Knowledge deficit related to management of necrotic/devitalized tissue Goals: Necrotic/devitalized tissue will be minimized in the wound bed Date Initiated: 12/04/2022 Target Resolution Date: 01/24/2023 Goal Status:  Active Patient/caregiver will verbalize understanding of reason and process for debridement of necrotic tissue Date Initiated: 12/04/2022 Target Resolution Date: 01/24/2023 Goal Status: Active Interventions: Assess patient pain level pre-, during and post procedure and prior to discharge Provide education on necrotic tissue and debridement process Treatment Activities: Apply topical anesthetic as ordered : 12/04/2022 Enzymatic debridement : 12/04/2022 Notes: Electronic Signature(s) Signed: 01/01/2023 4:38:35 PM By: Samuella Bruin Entered By: Samuella Bruin on 01/01/2023 10:48:33 -------------------------------------------------------------------------------- Pain Assessment Details Patient Name: Date of Service: [name], [name] 01/01/2023 10:45 A M Medical Record Number: [id number] Patient Account Number: [id number] Date of Birth/Sex: Treating RN: March 01, 1971 (52 y.o. F) Primary Care Terianne Thaker: Julianne Handler Other Clinician: Referring Elo Marmolejos: Treating Lauryl Seyer/Extender: Koleen Nimrod Weeks in Treatment: 46 Active Problems Location of Pain Severity and Description of Pain Patient Has Paino Yes Site Locations Rate the pain. Current Pain Level: 8 Worst Pain Level: 10 Least Pain Level: 0 Tolerable Pain Level: 2 Character of Pain Describe the Pain: Burning Pain Management and Medication Current Pain Management: ASHEY, KROLAK ([id number]) [id number] Page 5 of 9 Electronic Signature(s) Signed: 01/01/2023 2:31:22 PM By: Tenny Craw,  Aisha Entered ByDayton Scrape on 01/01/2023 10:30:35 -------------------------------------------------------------------------------- Patient/Caregiver Education Details Patient Name: Date of Service: [name], [place] [name] 8/14/2024andnbsp10:45 A M Medical Record Number: [id number] Patient Account Number: [id number] Date of Birth/Gender: Treating RN: 17-Dec-1970 (52 y.o. [name] Primary Care  Physician: Julianne Handler Other Clinician: Referring Physician: Treating Physician/Extender: Koleen Nimrod Weeks in Treatment: 79 Education Assessment Education Provided To: Patient Education Topics Provided Wound/Skin Impairment: Methods: Explain/Verbal Responses: Reinforcements needed, State content correctly Electronic Signature(s) Signed: 01/01/2023 4:38:35 PM By: Samuella Bruin Entered By: Samuella Bruin on 01/01/2023 10:48:57 -------------------------------------------------------------------------------- Wound Assessment Details Patient Name: Date of Service: [name], [name] 01/01/2023 10:45 A M Medical Record Number: [id number] Patient Account Number: [id number] Date of Birth/Sex: Treating RN: Sep 15, 1970 (52 y.o. F) Primary Care Michaele Amundson: Julianne Handler Other Clinician: Referring Harvy Riera: Treating Morty Ortwein/Extender: Koleen Nimrod Weeks in Treatment: 35 Wound Status Wound Number: 17 Primary Etiology: Sickle Cell Lesion Wound Location: Right, Lateral Lower Leg Wound Status: Open Wounding Event: Gradually Appeared Comorbid History: Anemia, Sickle Cell Disease, Neuropathy Date Acquired: 10/05/2012 Weeks Of Treatment: 89 Clustered Wound: Yes Photos [place], Felecia Shelling ([id number]) [id number] Page 6 of 9 Wound Measurements Length: (cm) 9 Width: (cm) 6 Depth: (cm) 0.1 Area: (cm) 42.412 Volume: (cm) 4.241 % Reduction in Area: 77.4% % Reduction in Volume: 77.4% Epithelialization: Small (1-33%) Tunneling: No Undermining: No Wound Description Classification: Full Thickness Without Exposed Support Structures Wound Margin: Distinct, outline attached Exudate Amount: Medium Exudate Type: Serosanguineous Exudate Color: red, brown Foul Odor After Cleansing: No Slough/Fibrino Yes Wound Bed Granulation Amount: Small (1-33%) Exposed Structure Granulation Quality: Pink Fascia Exposed: No Necrotic  Amount: Large (67-100%) Fat Layer (Subcutaneous Tissue) Exposed: Yes Necrotic Quality: Adherent Slough Tendon Exposed: No Muscle Exposed: No Joint Exposed: No Bone Exposed: No Periwound Skin Texture Texture Color No Abnormalities Noted: No No Abnormalities Noted: No Scarring: Yes Ecchymosis: No Hemosiderin Staining: Yes Moisture No Abnormalities Noted: Yes Temperature / Pain Temperature: No Abnormality Tenderness on Palpation: Yes Treatment Notes Wound #17 (Lower Leg) Wound Laterality: Right, Lateral Cleanser Soap and Water Discharge Instruction: May shower and wash wound with dial antibacterial soap and water prior to dressing change. Wound Cleanser Discharge Instruction: Cleanse the wound with wound cleanser prior to applying a clean dressing using gauze sponges, not tissue or cotton balls. Peri-Wound Care Triamcinolone 15 (g) Discharge Instruction: Use triamcinolone 15 (g) as directed Sween Lotion (Moisturizing lotion) Discharge Instruction: Apply moisturizing lotion as directed Topical Primary Dressing Santyl Ointment Discharge Instruction: Apply nickel thick amount to wound bed as instructed Secondary Dressing ABD Pad, 5x9 Discharge Instruction: Apply over primary dressing as directed. Woven Gauze Sponge, Non-Sterile 4x4 in Discharge Instruction: Moisten with normal saline and Apply over primary dressing as directed. AVEANNA, ORRICK ([id number]) [id number] Page 7 of 9 Secured With Elastic Bandage 4 inch (ACE bandage) Discharge Instruction: Secure with ACE bandage as directed. Kerlix Roll Sterile, 4.5x3.1 (in/yd) Discharge Instruction: Secure with Kerlix as directed. Compression Wrap Compression Stockings Add-Ons Electronic Signature(s) Signed: 01/01/2023 4:38:35 PM By: Samuella Bruin Entered By: Samuella Bruin on 01/01/2023 10:41:24 -------------------------------------------------------------------------------- Wound  Assessment Details Patient Name: Date of Service: [name], [name] 01/01/2023 10:45 A M Medical Record Number: [id number] Patient Account Number: [id number] Date of Birth/Sex: Treating RN: March 24, 1971 (52 y.o. F) Primary Care Guillermo Nehring: Julianne Handler Other Clinician: Referring Doretta Remmert: Treating Roxann Vierra/Extender: Koleen Nimrod Weeks in Treatment: 102 Wound Status Wound Number: 21 Primary Etiology: Sickle Cell Lesion Wound Location: Right, Medial Ankle Secondary Etiology: Venous  Leg Ulcer Wounding Event: Gradually Appeared Wound Status: Open Date Acquired: 06/26/2021 Comorbid History: Anemia, Sickle Cell Disease, Neuropathy Weeks Of Treatment: 79 Clustered Wound: No Photos Wound Measurements Length: (cm) 9.3 Width: (cm) 4.5 Depth: (cm) 0.1 Area: (cm) 32.869 Volume: (cm) 3.287 % Reduction in Area: -13.4% % Reduction in Volume: -13.4% Epithelialization: Small (1-33%) Tunneling: No Undermining: No Wound Description Classification: Full Thickness Without Exposed Support Structures Wound Margin: Distinct, outline attached Exudate Amount: Medium Exudate Type: Serosanguineous Exudate Color: red, brown Foul Odor After Cleansing: No Slough/Fibrino Yes Wound Bed Granulation Amount: Small (1-33%) Exposed Structure Granulation Quality: Pink Fascia Exposed: No Necrotic Amount: Large (67-100%) Fat Layer (Subcutaneous Tissue) Exposed: Yes Necrotic Quality: Adherent Slough Tendon Exposed: No [place], Felecia Shelling ([id number]) [id number] Page 8 of 9 Muscle Exposed: No Joint Exposed: No Bone Exposed: No Periwound Skin Texture Texture Color No Abnormalities Noted: No No Abnormalities Noted: Yes Scarring: Yes Temperature / Pain Temperature: No Abnormality Moisture No Abnormalities Noted: Yes Tenderness on Palpation: Yes Treatment Notes Wound #21 (Ankle) Wound Laterality: Right, Medial Cleanser Soap and Water Discharge Instruction: May  shower and wash wound with dial antibacterial soap and water prior to dressing change. Wound Cleanser Discharge Instruction: Cleanse the wound with wound cleanser prior to applying a clean dressing using gauze sponges, not tissue or cotton balls. Peri-Wound Care Triamcinolone 15 (g) Discharge Instruction: Use triamcinolone 15 (g) as directed Sween Lotion (Moisturizing lotion) Discharge Instruction: Apply moisturizing lotion as directed Topical Primary Dressing Santyl Ointment Discharge Instruction: Apply nickel thick amount to wound bed as instructed Secondary Dressing ABD Pad, 5x9 Discharge Instruction: Apply over primary dressing as directed. Woven Gauze Sponge, Non-Sterile 4x4 in Discharge Instruction: Moisten with normal saline and Apply over primary dressing as directed. Secured With Elastic Bandage 4 inch (ACE bandage) Discharge Instruction: Secure with ACE bandage as directed. Kerlix Roll Sterile, 4.5x3.1 (in/yd) Discharge Instruction: Secure with Kerlix as directed. Compression Wrap Compression Stockings Add-Ons Electronic Signature(s) Signed: 01/01/2023 4:38:35 PM By: Samuella Bruin Entered By: Samuella Bruin on 01/01/2023 10:45:19 -------------------------------------------------------------------------------- Vitals Details Patient Name: Date of Service: [name], [name] 01/01/2023 10:45 A M Medical Record Number: [id number] Patient Account Number: [id number] Date of Birth/Sex: Treating RN: Jul 13, 1970 (52 y.o. F) Primary Care Annya Lizana: Julianne Handler Other Clinician: Referring Jarquez Mestre: Treating Arkin Imran/Extender: Koleen Nimrod Weeks in Treatment: [address] [place], Felecia Shelling ([id number]) [id number] Page 9 of 9 Time Taken: 10:30 Temperature (F): 98.0 Height (in): 67 Pulse (bpm): 81 Weight (lbs): 134 Respiratory Rate (breaths/min): 18 Body Mass Index (BMI): 21 Blood Pressure (mmHg): 115/70 Reference  Range: 80 - 120 mg / dl Electronic Signature(s) Signed: 01/01/2023 2:31:22 PM By: Dayton Scrape Entered By: Dayton Scrape on 01/01/2023 10:30:18

## 2023-01-15 ENCOUNTER — Encounter (HOSPITAL_BASED_OUTPATIENT_CLINIC_OR_DEPARTMENT_OTHER): Payer: Medicaid Other | Admitting: General Surgery

## 2023-01-15 DIAGNOSIS — L97818 Non-pressure chronic ulcer of other part of right lower leg with other specified severity: Secondary | ICD-10-CM | POA: Diagnosis not present

## 2023-01-15 NOTE — Progress Notes (Signed)
ZYARA, KUNSMAN ([id number]) [id number] Page 1 of 18 Visit Report for 8/[name]/2024 Chief Complaint Document Details Patient Name: Date of Service: [name] [name] 8/[name]/2024 10:45 A M Medical Record Number: [id number] Patient Account Number: [id number] Date of Birth/Sex: Treating RN: [birth year] (52 y.o. F) Primary Care Provider: Julianne Handler Other Clinician: Referring Provider: Treating Provider/Extender: Koleen Nimrod Weeks in Treatment: 21 Information Obtained from: Patient Chief Complaint the patient is here for evaluation of her bilateral lower extremity sickle cell ulcers 04/17/2021; patient comes in for substantial wounds on the right and left lower leg Electronic Signature(s) Signed: 8/[name]/2024 11:24:43 AM By: Duanne Guess MD FACS Entered By: Duanne Guess on 08/[name]/2024 08:24:42 -------------------------------------------------------------------------------- Debridement Details Patient Name: Date of Service: [name] [name], [name] 8/[name]/2024 10:45 A M Medical Record Number: [id number] Patient Account Number: [id number] Date of Birth/Sex: Treating RN: [name], [name] (52 y.o. [name] Primary Care Provider: Julianne Handler Other Clinician: Referring Provider: Treating Provider/Extender: Koleen Nimrod Weeks in Treatment: 91 Debridement Performed for Assessment: Wound #17 Right,Lateral Lower Leg Performed By: Physician Duanne Guess, MD Debridement Type: Debridement Level of Consciousness (Pre-procedure): Awake and Alert Pre-procedure Verification/Time Out Yes - 10:45 Taken: Start Time: 10:45 Pain Control: Lidocaine 5% topical ointment Percent of Wound Bed Debrided: 80% T Area Debrided (cm): otal 33.76 Tissue and other material debrided: Non-Viable, Slough, Slough Level: Non-Viable Tissue Debridement Description: Selective/Open Wound Instrument: Curette Bleeding: Minimum Hemostasis  Achieved: Pressure Response to Treatment: Procedure was tolerated well Level of Consciousness (Post- Awake and Alert procedure): Post Debridement Measurements of Total Wound Length: (cm) 8.4 Width: (cm) 6.4 Depth: (cm) 0.1 Volume: (cm) 4.222 Character of Wound/Ulcer Post Debridement: Improved Post Procedure Diagnosis [name] ([id number]) [id number] Page 2 of 18 Same as Pre-procedure Notes scribed for Dr. Lady Gary by Samuella Bruin, RN Electronic Signature(s) Signed: 8/[name]/2024 3:26:24 PM By: Samuella Bruin Signed: 8/[name]/2024 3:38:16 PM By: Duanne Guess MD FACS Entered By: Samuella Bruin on 08/[name]/2024 07:47:56 -------------------------------------------------------------------------------- Debridement Details Patient Name: Date of Service: [name], [name] 8/[name]/2024 10:45 A M Medical Record Number: [id number] Patient Account Number: [id number] Date of Birth/Sex: Treating RN: [birth year] (52 y.o. [name] Primary Care Provider: Julianne Handler Other Clinician: Referring Provider: Treating Provider/Extender: Koleen Nimrod Weeks in Treatment: 91 Debridement Performed for Assessment: Wound #21 Right,Medial Ankle Performed By: Physician Duanne Guess, MD Debridement Type: Debridement Severity of Tissue Pre Debridement: Fat layer exposed Level of Consciousness (Pre-procedure): Awake and Alert Pre-procedure Verification/Time Out Yes - 10:45 Taken: Start Time: 10:45 Pain Control: Lidocaine 5% topical ointment Percent of Wound Bed Debrided: 80% T Area Debrided (cm): otal 27.13 Tissue and other material debrided: Non-Viable, Slough, Slough Level: Non-Viable Tissue Debridement Description: Selective/Open Wound Instrument: Curette Bleeding: Minimum Hemostasis Achieved: Pressure Response to Treatment: Procedure was tolerated well Level of Consciousness (Post- Awake and Alert procedure): Post  Debridement Measurements of Total Wound Length: (cm) 9.6 Width: (cm) 4.5 Depth: (cm) 0.1 Volume: (cm) 3.393 Character of Wound/Ulcer Post Debridement: Improved Severity of Tissue Post Debridement: Fat layer exposed Post Procedure Diagnosis Same as Pre-procedure Notes scribed for Dr. Lady Gary by Samuella Bruin, RN Electronic Signature(s) Signed: 8/[name]/2024 3:26:24 PM By: Samuella Bruin Signed: 8/[name]/2024 3:38:16 PM By: Duanne Guess MD FACS Entered By: Samuella Bruin on 08/[name]/2024 07:48:31 [name] ([id number]) [id number] Page 3 of 18 -------------------------------------------------------------------------------- HPI Details Patient Name: Date of Service: [name] [name] 8/[name]/2024 10:45 A M Medical Record Number: [id number] Patient Account Number: [id number] Date of Birth/Sex: Treating RN: [name]-[birth year] (  52 y.o. F) Primary Care Provider: Julianne Handler Other Clinician: Referring Provider: Treating Provider/Extender: Koleen Nimrod Weeks in Treatment: 65 History of Present Illness Location: medial and lateral ankle region on the right and left medial malleolus Quality: Patient reports experiencing a shooting pain to affected area(s). Severity: Patient states wound(s) are getting worse. Duration: right lower extremity bimalleolar ulcers have been present for approximately 2 years; the right medial malleolus ulcer has been there proximally 6 months Timing: Pain in wound is constant (hurts all the time) Context: The wound would happen gradually ssociated Signs and Symptoms: Patient reports having increase discharge. A HPI Description: 52 year old patient with a history of sickle cell anemia who was last seen by me with ulceration of the right lower extremity above the ankle and was referred to Dr. Marzetta Board for a surgical debridement as I was unable to do anything in the office due to excruciating pain. At that stage she  was referred from the plastic surgery service to dermatology who treated her for a skin infection with doxycycline and then Levaquin and a local antibiotic ointment. I understand the patient has since developed ulceration on the left ankle both medial and lateral and was now referred back to the wound center as dermatology has finished the management. I do not have any notes from the dermatology department Old notes: 52 year old patient with a history of sickle cell anemia, pain bilateral lower extremities, right lower extremity ulcer and has a history of receiving a skin graft( Theraskin) several months ago. She has been visiting the wound center [facility] and was seen by Dr. Leanord Hawking and Dr. Marzetta Board. after prolonged conservator therapy between July 2016 and January 2017. She had been seen by the plastic surgeon and taken to the OR for debridement and application of Theraskin. She had 3 applications of Theraskin and was then treated with collagen. Prior to that she had a history of similar problems in 2014 and was treated conservatively. Had a reflux study done for the right lower extremity in August 2016 without reflux or DVT . Past medical history significant for sickle cell disease, anemia, leg ulcers, cholelithiasis,and has never been a smoker. Once the patient was discharged on the wound center she says within 2 or 3 weeks the problems recurred and she has been treating it conservatively. since I saw her 3 weeks ago at [facility] she has been unable to get her dressing material but has completed a course of doxycycline. 6/7/ 2017 -- lower extremity venous duplex reflux evaluation was done No evidence of SVT or DVT in the RLL. No venous incompetence in the RLL. No further vascular workup is indicated at this time. She was seen by Dr. Mina Marble, on 10/04/2015. She agreed with the plan of taking her to the OR for debridement and application of theraskin and would also take biopsies to rule  out pyoderma gangrenosum. Follow-up note dated [name] 31 received and she was status post application of Theraskin to multiple ulcers around the right ankle. Pathology did not show evidence of malignancy or pyoderma gangrenosum. She would continue to see as in the wound clinic for further care and see Dr. Marzetta Board as needed. The patient brought the biopsy report and it was consistent with stasis ulcer no evidence of malignancy and the comment was that there was some adjacent neovascularization, fibrosis and patchy perivascular chronic inflammation. 06/[name]/2017 -- today we applied her first application of Theraskin 11/30/15; TheraSkin #2 12/13/2015 -- she is having a lot of pain locally  and is here for possible application of a theraskin today. 01/16/2016 -- the patient has significant pain and has noticed despite in spite of all local care and oral pain medication. It is impossible to debride her in the office. 02/06/2016 -- I do not see any notes from Dr. Leta Baptist( the patient has not made a call to the office know as she heard from them) and the only visit to recently was with her PCP Dr. Gypsy Decant -- I saw her on 01/16/2016 and prescribed 90 tablets of oxycodone 10 mg and did lab work and screening for HIV. the HIV was negative and hemoglobin was 6.3 with a WBC count of 14.9 and hematocrit of 17.8 with platelets of 561. 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 The bimalleolar ulcers to the right lower extremity have been present for approximately 2 years, the left medial malleolus ulcer has been present approximately 6 months. She has followed with Dr.Thimmappa in the past and has had a total of 3 applications of Theraskin (01/2015, 09/2015, 06/17/16). She has also followed with Dr. Meyer Russel here in the clinic and has received 2 applications of TheraSkin (11/10/15, 11/30/15). The patient  does experience chronic, and is not amenable to debridement. 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 She is not currently on any antibiotic therapy and has not been treated with any recently. 2/[name]/2018 -- was seen by Dr. Leta Baptist of plastic surgery who saw her 2 weeks postop application of Theraskin #3. She had removed her dressing and asked her to apply silver alginate on alternate days and follow-up back with the wound center. Future debridements and application of skin substitute would have to be done in the hospital due to her high risk for anesthesia. READMISSION 04/17/2021 Patient is now a 52 year old woman that we have had in this clinic for a prolonged period of time and 2016-2017 and then again for 2 visits in February 2018. At that point she had wounds on the right lower leg predominantly medial. She had also been seen by plastic surgery Dr. Marzetta Board who I believe took her to the OR for operative debridement and application of TheraSkin in 2017. After she left our clinic she was followed for a very prolonged period of time in the wound care center in [facility] who then referred her ultimately to [facility] where she was seen by Dr. Mardene Speak. Again taken her to the OR for skin grafting which apparently did not take. She had multiple other attempts at dressings although I have not really looked over all of these notes in great detail. She has not been seen in a wound care center in about a year. She states over the last year in addition to her right lower leg she has developed wounds on the left lower leg quite extensive. She is using Xeroform to all of these wounds without really any improvement. She also has Medicaid which does not cover wound products. The patient has had vascular work-ups in the past including most recently on 03/28/2021 showing biphasic waveforms on the right triphasic at the PTA and biphasic at the dorsalis pedis on the left. She  was unable to tolerate any degree of compression to do ABIs. Unfortunately TBI's were also not done. She had venous reflux studies done in 2017. This did not show any evidence of a DVT or SVT and no  venous incompetence was noted in the right leg at the time this was the only side with the wound As noted I did not look all over her old records. She apparently had a course of HBO and Baptist although I am not sure what the indication would have been. In any case she developed seizures and terminated treatment earlier. She is generally much more disabled than when we last saw her in clinic. She can no longer walk pretty much wheelchair-bound because predominantly of pain in the left hip. 04/24/2021; the patient tolerated the wraps we put on. We used Santyl and Hydrofera Blue under compression. I brought her back for a nurse visit for a change in dressing. With Medicaid we will have a hard time getting anything paid for and hence the need for compression. She arrives in clinic with all the wounds looking somewhat better in terms of surface [name], [name] ([id number]) [id number] Page 4 of 18 12/20; circumferential wound on the right from the lateral to the medial. She has open areas on the left medial and left lateral x2 on all of this with the same surface. This does not look completely healthy although she does have some epithelialization. She is not complaining of a lot of pain which is unusual for her sickle ulcers. I have not looked over her extensive records from [facility]. She had recent arterial studies and has a history of venous reflux studies I will need to look these over although I do not believe she has significant arterial disease 2023 05/22/2021; patient's wound areas measure slightly smaller. Still a lot of drainage coming from the right we have been using Hydrofera Blue and Santyl with some improvement in the wound surfaces. She tells me she will be getting  transfused later in the week for her underlying sickle cell anemia I have looked over her recent arterial studies which were done in the fall. This was in November and showed biphasic and triphasic waveforms but she could not tolerate ABIs because of pressure and unfortunately TBI's were not done. She has not had recent venous reflux studies that I can see 1/10; not much change about the same surface area. This has a yellowish surface to it very gritty. We have been using Santyl and Hydrofera Blue for a prolonged period. Culture I did last week showed methicillin sensitive staph aureus "rare". Our intake nurse reports greenish drainage which [name] be the Hydrofera Blue itself 1/17; wounds are continue to measure smaller although I am not sure about the accuracy here. Especially the areas on the right are covered in what looks to be a nonviable surface although she does have some epithelialization. Similarly she has areas on the left medial and left lateral ankle area which appear to have a better surface and perhaps are slightly smaller. We have been using Santyl and Hydrofera Blue. She cannot tolerate mechanical debridements She went for her reflux studies which showed significant reflux at the greater saphenous vein at the saphenofemoral junction as well as the greater saphenous vein in the proximal calf on the left she had reflux in the thigh and the common femoral vein and supra vein Fishel vein reflux in the greater saphenous vein. I will have vein and vascular look at this. My thoughts have been that these are likely sickle wounds. I looked through her old records from [facility] wound care center and then when she graduated to [facility] wound care center where she saw Dr. Ronda Fairly and Dr. Mardene Speak. Although  I can see she had reflux studies done I do not see that she actually saw a vein and vascular. I went over the fact that she had operative debridements and actual skin grafting that did not  take. I do not think these wounds have ever really progressed towards healing 1/31;Substantial wounds on the right ankle area. Hyper granulated very gritty adherent debris on the surface. 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 On the left she has no DVT or SVT deep vein reflux in the common femoral vein. Superficial vein reflux in the greater saphenous vein on although the vein diameter is not really all that large. I do not think there is anything that can be done with these although I am going to send her for consultation to vein and vascular. 2/7; Wound exam; substantial wound area on the right posterior ankle area and areas on the left medial ankle and left lateral ankle. I was able to debride the left medial ankle last week fairly aggressively and it is back this week to a completely nonviable surface She will see vascular surgery this Friday and I would like them to review the venous studies and also any comments on her arterial status. If they do not see an issue here I am going to refer her to plastic surgery for an operative debridement perhaps intraoperative ACell or Integra. Eventually she will require a deep tissue culture again 2/14; substantial wound area on the right posterior ankle, medial ankle. We have been using silver alginate The patient was seen by vein and vascular she had both venous reflux studies and arterial studies. 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 She had no evidence of superficial venous thrombosis. She did have superficial vein  reflux at the saphenofemoral junction and the greater saphenous vein. On the left no evidence of a DVT no evidence of superficial venous throat thrombosis she did have deep vein reflux in the common femoral vein and superficial vein reflux in the greater saphenous vein but these were not felt to be amenable to ablation. In terms of arterial studies she had triphasic and wife biphasic wave waveforms bilaterally not felt to have a significant arterial issue. I do not get the feeling that they felt that any part of her nonhealing wounds were related to either arterial or venous issues. They did note that she had venous reflux at the right at the [facility] and [facility]. And also on the left there were reflux in the deep system at the common femoral vein and greater saphenous vein in the proximal thigh. Nothing amenable to ablation. 2/20; she is making some decent progress on the right where there is nice skin between the 2 open areas on the right ankle. The surfaces here do not look viable yet there is some surrounding epithelialization. She still has a small area on the left medial ankle area. Hyper-granulated [name] 2/[name] patient has an appointment with plastic surgery on 3/8. We will see her back on 3/9. She [name] have to call us  to get the area redressed. We've been using Santyl under silver alginate. We made a nice improvement on the left medial ankle. The larger wounds on the right also looks somewhat better in terms of epithelialization although I think they could benefit from an aggressive debridement if plastic surgery would be willing to do that. Perhaps placement of Integra or a cell 07/26/2021: She saw Dr. Arita Miss yesterday. He raised the question as to whether or not this might be pyoderma and wanted to wait until that question was answered by dermatology before proceeding with any sort of operative debridement. We have continue to use Santyl under silver alginate with Kerlix and Coban  wraps. Overall, her wounds appear to be continuing to contract and epithelialize, with some granulation tissue present. There continues to be some slough on all wound surfaces. 08/09/2021: She has not been able to get an appointment with dermatology because apparently the offices in [place] [employer] and [place] do not accept Medicaid. She is looking into whether or not she can be seen at the main [facility] dermatology clinic. This is necessary because plastic surgery is concerned that her wounds might represent pyoderma and they did not want to do any procedure until that was clarified. We have been using Santyl under silver alginate with Kerlix and Coban wraps. Today, there was a greater amount of drainage on her dressings with a slight green discoloration and significant odor. Despite this, her wounds continue to contract and epithelialize. There is pale granulation tissue present and actually, on the left medial ankle, the granulation tissue is a bit hypertrophic. 08/16/2021: Last week, I took a culture and this grew back rare methicillin-resistant Staph aureus and rare corynebacterium. The MRSA was sensitive to gentamicin which we began applying topically on an empiric basis. This week, her wounds are a bit smaller and the drainage and odor are less. Her primary care provider is working on assisting the patient with a dermatology evaluation. She has been in silver alginate over the gentamicin that was started last week along with Kerlix and Coban wraps. 08/23/2021: Because she has Medicaid, we have been unable to get her into see any dermatologist in the [place] to rule out pyoderma gangrenosum, which was a requirement from plastic surgery prior to any sort of debridement and grafting. Despite this, however, all of her wounds continue to get smaller. The wound on her left medial ankle is nearly closed. There is no odor from the wounds, although she still accumulates a modest amount of drainage on  her dressings. 08/30/2021: The lateral right ankle wound and the medial left ankle wound are a bit smaller today. The medial right ankle wound is about the same size. They are less tender. We have still been unable to get her into dermatology. 09/06/2021: All of the wounds are about the same size today. She continues to endorse minimal pain. I communicated with Dr. Arita Miss in plastic surgery regarding our issues getting a dermatology appointment; he was out of town but indicated that he would look into perhaps performing the biopsy in his office and will have his office contact her. REMA, JULSON ([id number]) [id number] Page 5 of 18 4/[name]/2023: The patient has an appointment in dermatology, but it is not until October. Her wounds are roughly the same; she continues to have very thick purulent-looking drainage on her dressings. 09/20/2021: The left medial wound is nearly closed and just has a bit of accumulated eschar on the surface. The right medial and lateral ankle wounds are  perhaps a little bit smaller. They continue to have a very pale surface with accumulation of thin slough. 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 I did not think Jodie Echevaria was indicated based on this. She is getting topical mupirocin with Prisma silver collagen. 10/04/2021: The patient was not seen in clinic last week due to childcare coverage issues. In the interim, the left medial leg wound has closed. The right sided leg wounds are smaller. There is more granulation tissue coming through, particularly on the lateral wound. The surface remains somewhat gritty. We have been applying topical mupirocin and Prisma silver collagen. 10/11/2021: The left medial leg wound remains closed. She does complain of some anesthetic sensation to the area. Both of the right-sided leg wounds are smaller but still have accumulated slough. 10/18/2021: Both right-sided leg wounds are minimally  smaller this week. She still continues to accumulate slough and has thick drainage on her dressings. 10/23/2021: Both wounds continue to contract. There is still slough buildup. She has been approved for a keratin-based skin substitute trial product but it will not be available until next week. 10/30/2021: The wounds are about the same to perhaps slightly smaller. There is still continued slough buildup. Unfortunately, the rep for the keratin based product did not show up today and did not answer his phone when called. 11/08/2021: The wounds are little bit smaller today. She continues to have thick drainage but the surfaces are relatively clean with just a little bit of slough accumulation. She reported to me today that she is unable to completely flex her left ankle and on examination it seems this is potentially related to scar tissue from her wounds. We do have the ProgenaMatrix trial product available for her today. 11/15/2021: Both wounds are smaller today. There is some slough accumulation on the surfaces, but the medial wound, in particular looks like it is filling in and is less deep. She did hear from physical therapy and she is going to start working with them on July 11. She is here for her second application of the trial skin substitute, ProgenaMatrix. 11/22/2021: Both wounds continue to contract, the medial more dramatically than the lateral. Both wounds have a layer of slough on the surface, but underneath this, the gritty fibrous tissue has a little bit more of a pink cast to it rather than being as pale as it has been. 11/29/2021: The wounds are roughly the same size this week, perhaps a millimeter or 2 smaller. The medial wound has filled in and is nearly flush with the surrounding skin surface. She continues to have a lot of slough accumulation on both surfaces. 12/06/2021: No significant change to her wounds, but she has a new opening on her dorsal foot, just distal to the right lateral  ankle wound. The area on her left medial ankle that reopened looks a little bit larger today. She has quite a bit of pain associated with the new wound. 12/12/2021: Her wounds look about the same but the new opening on her right lateral dorsal foot is a little bit bigger. She continues to have a fair amount of pain with this wound. 12/26/2021: The left medial ankle wound is tiny and superficial. She has 2 areas of crusting on her left lateral ankle, however, that appear to be threatening to open again. Her right medial ankle wound is a little bit smaller today but still continues to accumulate thick rubbery slough. The new dorsal foot wound is exquisitely painful but  there is no odor or purulent drainage. No erythema or induration. The right lateral ankle wound looks about the same today, again with thick rubbery slough. 01/03/2022: The left medial ankle wound has closed again. Both right ankle wounds appear to be about the same size with thick rubbery slough. The dorsal foot wound on the right continues to be quite painful and she stated that she did not want any debridement of that site today. 01/10/2022: No real change to any of her wounds. She continues to accumulate thick slough. The dorsal foot wound has merged with the lateral malleolar wound. She is experiencing significant pain in the dorsal foot portion of the ulcer. 01/16/2022: Absolutely no change or progress in her wounds. 01/24/2022: Her wounds are unchanged. She continues to build up slough and the wounds on her dorsal right foot are still exquisitely tender. 01/31/2022: The wounds actually measure a little bit narrower today. They still have thick slough on the surface but the underlying tissue seems a little less fibrotic. We changed to Iodosorb last week. 02/07/2022: Wounds continue to slowly epithelialize around the parameters. She has less pain today. She still accumulates a fairly substantial layer of slough. 02/15/2022: No change to the  wounds overall. The measured a little bit larger today per the intake nurse. She continues to have substantial slough accumulation and her pain is a little bit worse. 02/21/2022. No change at all to her wounds. I do not see that plastic surgery has received her referral yet. 02/28/2022: The wounds remain unchanged. They are dry and fibrotic with accumulation of slough and eschar. She did receive an appointment to see plastic surgery on October 23, but the office called her back and indicated they needed to reschedule and that the next available appointment was not until December. The patient became angry and decided she did not want to see this plastics group. 10/19; the patient sees Dr. Maylene Roes again next week. She is using Medihoney as the primary dressing changing this herself. 03/21/2022: She saw Dr. Ferd Hibbs at the wound care center at [facility]. Dr. Ferd Hibbs was also unable to debride her, secondary to pain and is planning to take her to the operating room for operative debridement and potential skin substitute placement. Her wounds are unchanged with thick yellow slough and a fibrotic base. She says they are too painful to be debrided. In addition, yesterday her hemoglobin was 4 and she received 2 units packed red blood cell transfusion. 04/04/2022: Her operation at [facility] is scheduled for December 15. She continues to use Medihoney on her wounds. They are a little bit less painful today and she is willing to entertain the possibility of debridement. The wounds measured a little bit larger in all dimensions today but the layer of slough is not as thick as usual. 04/18/2022: Her wounds actually measured a little bit smaller today and the extension onto the dorsal foot from the lateral wound has healed. They are less tender and there is significantly less slough present as compared to prior visits. 05/09/2022: She had to reschedule her surgery due to lack of childcare.  It is now planned for January 5. The wounds are basically unchanged, but she had a lot more drainage, which was thick and somewhat purulent in appearance. No significant odor. 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 They are quite painful today and she does not want to have debridement performed. 05/30/2022: The patient underwent surgical debridement and placement of TheraSkin to her  wounds on January 5. She has been doing well and does not endorse any pain. The TheraSkin is intact and looks beautiful. It is sutured in place. There is no exudate or significant drainage. [name], [name] ([id number]) [id number] Page 6 of 18 06/04/2022: Her TheraSkin remains intact and I am starting to see granulation tissue emerging underneath the surface. No concern for infection. 06/12/2022: The TheraSkin is intact and there is more granulation tissue emerging. It is starting to look a little bit dry, however. No malodor or purulent drainage. 06/19/2022: The TheraSkin continues to look good. The edges are a bit dry, but the central portion of each of her wounds is showing a nice pink color. 06/27/2022: The TheraSkin on the lateral wound remains almost completely intact. There is nice pink tissue peaking through the mesh of the TheraSkin. On the medial leg, it is starting to breakdown a little bit, but most of the TheraSkin remains intact here, as well. Both wounds measured smaller today. 07/03/2022: The wounds are fairly dry around the perimeter today. Some of the suture is hanging loose. 07/12/2022: Both wounds are measuring smaller today. The medial ulcer is getting a bit too dry. The lateral ulcer continues to have nice buds of granulation tissue emerging. 07/26/2022: The moisture balance on both wounds is much better today. For the first time since I began seeing her, I see actual beefy red granulation tissue on the lateral ankle wound. There are more buds of deep pink granulation  tissue emerging on the medial ankle. There is some dry eschar around the edges of the medial wound and a tiny amount of slough overlying the good granulation tissue on the lateral wound. 08/09/2022: Unfortunately, she has a new wound on her left lateral lower leg, just above the ankle. It is extremely painful for her. It penetrates to the fat layer and has the same woody, fibrotic character as her previous wounds. The other wounds are both a little bit smaller with slough and some eschar around the periphery. 08/14/2022: Her right lateral ankle wound is a little bit smaller. Unfortunately, she has had an increase in her drainage and it has a purulent nature to it, similar to what was present prior to her surgical debridement. She also reports odor coming from her wounds this week. The left lateral leg wound remains extremely tender. 08/21/2022: The left lateral ankle wound is larger and more painful today. Both right ankle wounds have a little bit more vital appearance, but they have accumulated more slough. She reports less odor this week. She is currently taking Augmentin. 08/28/2022: The left lateral ankle wound continues to enlarge and remains quite painful. Both of the right ankle wounds actually look better this week. There is improved tissue quality with less slough accumulation. 09/27/2022: The left lateral ankle wound actually looks nearly healed. She says that she has been applying some silver alginate that she had leftover. She was unable to afford the Santyl that was prescribed, but found a tube that she had on hand at home and has been using this on her right ankle wounds. These wounds look about the same and have accumulated the usual layer of slough. Unfortunately, a portion of the dorsal foot wound has reopened and is exquisitely painful today. 10/09/2022: The left lateral ankle wound is healed. The dorsal foot wound appears to have expanded and remains exquisitely painful. The medial and  lateral right ankle wounds look about the same size, but they are quite a bit cleaner. 10/23/2022: The left  lateral ankle remains closed. The dorsal foot wound has expanded to the point that it now converges with the right lateral leg wound. She has accumulated fairly thick slough on all of the open surfaces. 11/06/2022: No significant change to any of her wounds, although the dorsal foot aspect of the right lateral wound is a little bit smaller. She has been approved for the [employer] and [employer] for [employer] and will be able to get a full year's supply. 11/20/2022: The dorsal foot aspect of the right lateral wound has almost completely closed. I am seeing nice pink tissue starting to emerge from the fibrotic surface of both wounds. The medial leg wound appears to be a little bit smaller today. She has been using Santyl. 12/04/2022: The dorsal foot aspect of the right lateral wound has closed. There is more pink granulation tissue emerging on her wounds. There are buds of epithelium beginning to fill in from the edges of her wound, particularly at the proximal aspect of the medial wound and the distal aspect of the lateral wound. Significantly less slough formation than in the past. 12/18/2022: The wounds measured about the same size today, but there is better granulation tissue filling in and the overall surface consistency is improving. She does have slough accumulation on the surfaces, but no malodor or purulent drainage. 01/01/2023: No significant change to her wounds. The surface seems a little bit more fibrotic today. She is complaining of more pain. 8/[name]/2024: Her wounds are stable today. She has been taking Augmentin based on the culture that I took at her previous visit. She says that she has felt physically better since being on the antibiotic and has had less drainage from her wounds. Electronic Signature(s) Signed: 8/[name]/2024 11:25:42 AM By: Duanne Guess MD FACS Entered By:  Duanne Guess on 08/[name]/2024 08:25:42 -------------------------------------------------------------------------------- Physical Exam Details Patient Name: Date of Service: [name] [name], [name] 8/[name]/2024 10:45 A M Medical Record Number: [id number] Patient Account Number: [id number] Date of Birth/Sex: Treating RN: [birth year][name] (52 y.o. F) Primary Care Provider: Julianne Handler Other Clinician: Referring Provider: Treating Provider/Extender: Koleen Nimrod Weeks in Treatment: 61 Constitutional Hypertensive, asymptomatic. . . . no acute distress. YARELIZ, HACKL ([id number]) [id number] Page 7 of 18 Respiratory Normal work of breathing on room air. Notes 8/[name]/2024: Her wounds are stable today. Electronic Signature(s) Signed: 8/[name]/2024 11:26:33 AM By: Duanne Guess MD FACS Entered By: Duanne Guess on 08/[name]/2024 08:26:33 -------------------------------------------------------------------------------- Physician Orders Details Patient Name: Date of Service: [name] [name], [name] 8/[name]/2024 10:45 A M Medical Record Number: [id number] Patient Account Number: [id number] Date of Birth/Sex: Treating RN: [name][birth year] (52 y.o. 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 - Dr. Lady Gary Rm 2 Return A Anesthetic (In clinic) Topical Lidocaine 5% applied to wound bed Bathing/ Shower/ Hygiene [name] shower and wash wound with soap and water. Edema Control - Lymphedema / SCD / Other Elevate legs to the level of the heart or above for 30 minutes daily and/or when  sitting for 3-4 times a day throughout the day. Avoid standing for long periods of time. Exercise regularly Additional Orders / Instructions Follow Nutritious Diet Juven Shake 1-2 times daily. Wound Treatment Wound #17 - Lower  Leg Wound Laterality: Right, Lateral Cleanser: Soap and Water 1 x Per Week/30 Days Discharge Instructions: [name] shower and wash wound with dial antibacterial soap and water prior to dressing change. Cleanser: Wound Cleanser 1 x Per Week/30 Days Discharge Instructions: Cleanse the wound with wound cleanser prior to applying a clean dressing using gauze sponges, not tissue or cotton balls. Peri-Wound Care: Triamcinolone 15 (g) 1 x Per Week/30 Days Discharge Instructions: Use triamcinolone 15 (g) as directed Peri-Wound Care: Sween Lotion (Moisturizing lotion) 1 x Per Week/30 Days Discharge Instructions: Apply moisturizing lotion as directed Prim Dressing: Santyl Ointment 1 x Per Week/30 Days ary Discharge Instructions: Apply nickel thick amount to wound bed as instructed Secondary Dressing: ABD Pad, 5x9 1 x Per Week/30 Days Discharge Instructions: Apply over primary dressing as directed. Secondary Dressing: Woven Gauze Sponge, Non-Sterile 4x4 in 1 x Per Week/30 Days Discharge Instructions: Moisten with normal saline and Apply over primary dressing as directed. ASIANAE, KEIGLEY ([id number]) [id number] Page 8 of 18 Secured With: Elastic Bandage 4 inch (ACE bandage) 1 x Per Week/30 Days Discharge Instructions: Secure with ACE bandage as directed. Secured With: [employer], 4.5x3.1 (in/yd) 1 x Per Week/30 Days Discharge Instructions: Secure with Kerlix as directed. Wound #21 - Ankle Wound Laterality: Right, Medial Cleanser: Soap and Water 1 x Per Week/30 Days Discharge Instructions: [name] shower and wash wound with dial antibacterial soap and water prior to dressing change. Cleanser: Wound Cleanser 1 x Per Week/30 Days Discharge  Instructions: Cleanse the wound with wound cleanser prior to applying a clean dressing using gauze sponges, not tissue or cotton balls. Peri-Wound Care: Triamcinolone 15 (g) 1 x Per Week/30 Days Discharge Instructions: Use triamcinolone 15 (g) as directed Peri-Wound Care: Sween Lotion (Moisturizing lotion) 1 x Per Week/30 Days Discharge Instructions: Apply moisturizing lotion as directed Prim Dressing: Santyl Ointment 1 x Per Week/30 Days ary Discharge Instructions: Apply nickel thick amount to wound bed as instructed Secondary Dressing: ABD Pad, 5x9 1 x Per Week/30 Days Discharge Instructions: Apply over primary dressing as directed. Secondary Dressing: Woven Gauze Sponge, Non-Sterile 4x4 in 1 x Per Week/30 Days Discharge Instructions: Moisten with normal saline and Apply over primary dressing as directed. Secured With: Elastic Bandage 4 inch (ACE bandage) 1 x Per Week/30 Days Discharge Instructions: Secure with ACE bandage as directed. Secured With: [employer], 4.5x3.1 (in/yd) 1 x Per Week/30 Days Discharge Instructions: Secure with Kerlix as directed. Patient Medications llergies: No Known Allergies A Notifications Medication Indication Start End 8/[name]/2024 lidocaine DOSE topical 5 % ointment - ointment topical 8/[name]/2024 amoxicillin-pot clavulanate DOSE oral 875 mg-125 mg tablet - 1 tab p.o. twice daily x 20 days Electronic Signature(s) Signed: 8/[name]/2024 3:38:16 PM By: Duanne Guess MD FACS Previous Signature: 8/[name]/2024 11:27:49 AM Version By: Duanne Guess MD FACS Entered By: Duanne Guess on 08/[name]/2024 08:[name]:03 -------------------------------------------------------------------------------- Problem List Details Patient Name: Date of Service: [name] [name], [name] 8/[name]/2024 10:45 A M Medical Record Number: [id number] Patient Account Number: [id number] Date of Birth/Sex: Treating RN: [name] (52 y.o. 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 04/17/2021 No Yes Inactive Problems ICD-10 Code Description Active Date Inactive Date L97.828 Non-pressure chronic ulcer of other part  of left lower leg with other specified severity 04/17/2021 04/17/2021 Resolved Problems ICD-10 Code Description Active Date Resolved Date L97.822 Non-pressure chronic ulcer of other part of left lower leg with fat layer exposed 08/09/2022 08/09/2022 Electronic Signature(s) Signed: 8/[name]/2024 11:24:23 AM By: Duanne Guess MD FACS Entered By: Duanne Guess on 08/[name]/2024 08:24:22 -------------------------------------------------------------------------------- Progress Note Details Patient Name: Date of Service: [name] [name], [name] 8/[name]/2024 10:45 A M Medical Record Number: [id number] Patient Account Number: [id number] Date of Birth/Sex: Treating RN: [name]-[birth year] (52 y.o. F) Primary Care Provider: Julianne Handler Other Clinician: Referring Provider: Treating Provider/Extender: Koleen Nimrod Weeks in Treatment: 10 Subjective Chief Complaint Information obtained from Patient the patient is here for evaluation of her bilateral lower extremity sickle cell ulcers 04/17/2021; patient comes in for substantial wounds on the right and left lower leg History of Present Illness (HPI) The following HPI elements were documented for the patient's wound: Location: medial and lateral ankle region on the right and left medial malleolus Quality: Patient reports experiencing a shooting pain to affected area(s). Severity:  Patient states wound(s) are getting worse. Duration: right lower extremity bimalleolar ulcers have been present for approximately 2 years; the right medial malleolus ulcer has been there proximally 6 months Timing: Pain in wound is constant (hurts all the time) Context: The wound would happen gradually Associated Signs and Symptoms: Patient reports having increase discharge. 52 year old patient with a history of sickle cell anemia who was last seen by me with ulceration of the right lower extremity above the ankle and was referred to Dr. Marzetta Board for a surgical debridement as I was unable to do anything in the office due to excruciating pain. At that stage she was referred from the plastic surgery service to dermatology who treated her for a skin infection with doxycycline and then Levaquin and a local antibiotic ointment. I understand the patient has since developed ulceration on the left ankle both medial and lateral and was now referred back to the wound center as dermatology has finished the management. I do not have any notes from the dermatology department Old notes: 52 year old patient with a history of sickle cell anemia, pain bilateral lower extremities, right lower extremity ulcer and has a history of receiving a skin graft( Theraskin) several months ago. She has been visiting the wound center [facility] and was seen by Dr. Leanord Hawking and Dr. Marzetta Board. after prolonged conservator therapy between July 2016 and January 2017. She had been seen by the plastic surgeon and taken to the OR for debridement and application of Theraskin. She had 3 applications of Theraskin and was then treated with collagen. Prior to that she had a history of similar problems in 2014 and was treated conservatively. Had a reflux study done for the right lower extremity in August 2016 without reflux or DVT . Past medical history significant for sickle cell disease, anemia, leg ulcers, cholelithiasis,and has never been a  smoker. [name], [name] ([id number]) [id number] Page 10 of 18 Once the patient was discharged on the wound center she says within 2 or 3 weeks the problems recurred and she has been treating it conservatively. since I saw her 3 weeks ago at [facility] she has been unable to get her dressing material but has completed a course of doxycycline. 6/7/ 2017 -- lower extremity venous duplex reflux evaluation was done  No evidence of SVT or DVT in the RLL. No venous incompetence in the RLL. No further vascular workup is indicated at this time. She was seen by  Dr. Mina Marble, on 10/04/2015. She agreed with the plan of taking her to the OR for debridement and application of theraskin and would also take biopsies to rule out pyoderma gangrenosum. Follow-up note dated [name] 31 received and she was status post application of Theraskin to multiple ulcers around the right ankle. Pathology did not show evidence of malignancy or pyoderma gangrenosum. She would continue to see as in the wound clinic for further care and see Dr. Marzetta Board as needed. The patient brought the biopsy report and it was consistent with stasis ulcer no evidence of malignancy and the comment was that there was some adjacent neovascularization, fibrosis and patchy perivascular chronic inflammation. 06/[name]/2017 -- today we applied her first application of Theraskin 11/30/15; TheraSkin #2 12/13/2015 -- she is having a lot of pain locally and is here for possible application of a theraskin today. 01/16/2016 -- the patient has significant pain and has noticed despite in spite of all local care and oral pain medication. It is impossible to debride her in the office. 02/06/2016 -- I do not see any notes from Dr. Leta Baptist( the patient has not made a call to the office know as she heard from them) and the only visit to recently was with her PCP Dr. Gypsy Decant -- I saw her on 01/16/2016 and prescribed 90 tablets of  oxycodone 10 mg and did lab work and screening for HIV. the HIV was negative and hemoglobin was 6.3 with a WBC count of 14.9 and hematocrit of 17.8 with platelets of 561. 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 The bimalleolar ulcers to the right lower extremity have been present for approximately 2 years, the left medial malleolus ulcer has been present approximately 6 months. She has followed with Dr.Thimmappa in the past and has had a total of 3 applications of Theraskin (01/2015, 09/2015, 06/17/16). She has also followed with Dr. Meyer Russel here in the clinic and has received 2 applications of TheraSkin (11/10/15, 11/30/15). The patient does experience chronic, and is not amenable to debridement. 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 She is not currently on any antibiotic therapy and has not been treated with any recently. 2/[name]/2018 -- was seen by Dr. Leta Baptist of plastic surgery who saw her 2 weeks postop application of Theraskin #3. She had removed her dressing and asked her to apply silver alginate on alternate days and follow-up back with the wound center. Future debridements and application of skin substitute would have to be done in the hospital due to her high risk for anesthesia. READMISSION 04/17/2021 Patient is now a 52 year old woman that we have had in this clinic for a prolonged period of time and 2016-2017 and then again for 2 visits in February 2018. At that point she had wounds on the right lower leg predominantly medial. She had also been seen by plastic surgery Dr. Marzetta Board who I believe took her to the OR for operative debridement and application of TheraSkin in 2017. After she left our clinic she was followed for a very prolonged period of time in the wound care center in [facility] who then referred her ultimately to  [facility] where she was seen by Dr. Mardene Speak. Again taken her to the OR for skin grafting which apparently did not take. She had multiple other attempts at dressings although I have not really looked over all of  these notes in great detail. She has not been seen in a wound care center in about a year. She states over the last year in addition to her right lower leg she has developed wounds on the left lower leg quite extensive. She is using Xeroform to all of these wounds without really any improvement. She also has Medicaid which does not cover wound products. The patient has had vascular work-ups in the past including most recently on 03/28/2021 showing biphasic waveforms on the right triphasic at the PTA and biphasic at the dorsalis pedis on the left. She was unable to tolerate any degree of compression to do ABIs. Unfortunately TBI's were also not done. She had venous reflux studies done in 2017. This did not show any evidence of a DVT or SVT and no venous incompetence was noted in the right leg at the time this was the only side with the wound As noted I did not look all over her old records. She apparently had a course of HBO and Baptist although I am not sure what the indication would have been. In any case she developed seizures and terminated treatment earlier. She is generally much more disabled than when we last saw her in clinic. She can no longer walk pretty much wheelchair-bound because predominantly of pain in the left hip. 04/24/2021; the patient tolerated the wraps we put on. We used Santyl and Hydrofera Blue under compression. I brought her back for a nurse visit for a change in dressing. With Medicaid we will have a hard time getting anything paid for and hence the need for compression. She arrives in clinic with all the wounds looking somewhat better in terms of surface 12/20; circumferential wound on the right from the lateral to the medial. She has open areas on the left medial and left  lateral x2 on all of this with the same surface. This does not look completely healthy although she does have some epithelialization. She is not complaining of a lot of pain which is unusual for her sickle ulcers. I have not looked over her extensive records from [facility]. She had recent arterial studies and has a history of venous reflux studies I will need to look these over although I do not believe she has significant arterial disease 2023 05/22/2021; patient's wound areas measure slightly smaller. Still a lot of drainage coming from the right we have been using Hydrofera Blue and Santyl with some improvement in the wound surfaces. She tells me she will be getting transfused later in the week for her underlying sickle cell anemia I have looked over her recent arterial studies which were done in the fall. This was in November and showed biphasic and triphasic waveforms but she could not tolerate ABIs because of pressure and unfortunately TBI's were not done. She has not had recent venous reflux studies that I can see 1/10; not much change about the same surface area. This has a yellowish surface to it very gritty. We have been using Santyl and Hydrofera Blue for a prolonged period. Culture I did last week showed methicillin sensitive staph aureus "rare". Our intake nurse reports greenish drainage which [name] be the Hydrofera Blue itself 1/17; wounds are continue to measure smaller although I am not sure about the accuracy here. Especially the areas on the right are covered in what looks to be a nonviable surface although she does have some epithelialization. Similarly she has areas on the left medial and left lateral ankle area which appear  to have a better surface and perhaps are slightly smaller. We have been using Santyl and Hydrofera Blue. She cannot tolerate mechanical debridements She went for her reflux studies which showed significant reflux at the greater saphenous vein at the saphenofemoral  junction as well as the greater saphenous vein in the proximal calf on the left she had reflux in the thigh and the common femoral vein and supra vein Fishel vein reflux in the greater saphenous vein. I will have vein and vascular look at this. My thoughts have been that these are likely sickle wounds. I looked through her old records from [facility] wound care center and then when she graduated to [facility] wound care center where she saw Dr. Ronda Fairly and Dr. Mardene Speak. Although I can see she had reflux studies done I do not see that she actually saw a vein and vascular. I went over the fact that she had operative debridements and actual skin grafting that did not take. I do not think these wounds have ever really progressed towards healing 1/31;Substantial wounds on the right ankle area. Hyper granulated very gritty adherent debris on the surface. 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 On the left she has no DVT or SVT deep DAYLYNN, BERGSTRAND ([id number]) [id number] Page 11 of 18 vein reflux in the common femoral vein. Superficial vein reflux in the greater saphenous vein on although the vein diameter is not really all that large. I do not think there is anything that can be done with these although I am going to send her for consultation to vein and vascular. 2/7; Wound exam; substantial wound area on the right posterior ankle area and areas on the left medial ankle and left lateral ankle. I was able to debride the left medial ankle last week fairly aggressively and it is back this week to a completely nonviable  surface She will see vascular surgery this Friday and I would like them to review the venous studies and also any comments on her arterial status. If they do not see an issue here I am going to refer her to plastic surgery for an operative debridement perhaps intraoperative ACell or Integra. Eventually she will require a deep tissue culture again 2/14; substantial wound area on the right posterior ankle, medial ankle. We have been using silver alginate The patient was seen by vein and vascular she had both venous reflux studies and arterial studies. 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 She had no evidence of superficial venous thrombosis. She did have superficial vein reflux at the saphenofemoral junction and the greater saphenous vein. On the left no evidence of a DVT no evidence of superficial venous throat thrombosis she did have deep vein reflux in the common femoral vein and superficial vein reflux in the greater saphenous vein but these were not felt to be amenable to ablation. In terms of arterial studies she had triphasic and wife biphasic wave waveforms bilaterally not felt to have a significant arterial issue. I do not get the feeling that  they felt that any part of her nonhealing wounds were related to either arterial or venous issues. They did note that she had venous reflux at the right at the [facility] and [facility]. And also on the left there were reflux in the deep system at the common femoral vein and greater saphenous vein in the proximal thigh. Nothing amenable to ablation. 2/20; she is making some decent progress on the right where there is nice skin between the 2 open areas on the right ankle. The surfaces here do not look viable yet there is some surrounding epithelialization. She still has a small area on the left medial ankle area. Hyper-granulated [name] 2/[name] patient has an appointment with  plastic surgery on 3/8. We will see her back on 3/9. She [name] have to call us to get the area redressed. We've been using Santyl under silver alginate. We made a nice improvement on the left medial ankle. The larger wounds on the right also looks somewhat better in terms of epithelialization although I think they could benefit from an aggressive debridement if plastic surgery would be willing to do that. Perhaps placement of Integra or a cell 07/26/2021: She saw Dr. Arita Miss yesterday. He raised the question as to whether or not this might be pyoderma and wanted to wait until that question was answered by dermatology before proceeding with any sort of operative debridement. We have continue to use Santyl under silver alginate with Kerlix and Coban wraps. Overall, her wounds appear to be continuing to contract and epithelialize, with some granulation tissue present. There continues to be some slough on all wound surfaces. 08/09/2021: She has not been able to get an appointment with dermatology because apparently the offices in [facility] and [place] do not accept Medicaid. She is looking into whether or not she can be seen at the main [facility] dermatology clinic. This is necessary because plastic surgery is concerned that her wounds might represent pyoderma and they did not want to do any procedure until that was clarified. We have been using Santyl under silver alginate with Kerlix and Coban wraps. Today, there was a greater amount of drainage on her dressings with a slight green discoloration and significant odor. Despite this, her wounds continue to contract and epithelialize. There is pale granulation tissue present and actually, on the left medial ankle, the granulation tissue is a bit hypertrophic. 08/16/2021: Last week, I took a culture and this grew back rare methicillin-resistant Staph aureus and rare corynebacterium. The MRSA was sensitive to gentamicin which we began applying topically  on an empiric basis. This week, her wounds are a bit smaller and the drainage and odor are less. Her primary care provider is working on assisting the patient with a dermatology evaluation. She has been in silver alginate over the gentamicin that was started last week along with Kerlix and Coban wraps. 08/23/2021: Because she has Medicaid, we have been unable to get her into see any dermatologist in the [place] to rule out pyoderma gangrenosum, which was a requirement from plastic surgery prior to any sort of debridement and grafting. Despite this, however, all of her wounds continue to get smaller. The wound on her left medial ankle is nearly closed. There is no odor from the wounds, although she still accumulates a modest amount of drainage on her dressings. 08/30/2021: The lateral right ankle wound and the medial left ankle wound are a bit smaller today. The medial right ankle wound is about the same size.  They are less tender. We have still been unable to get her into dermatology. 09/06/2021: All of the wounds are about the same size today. She continues to endorse minimal pain. I communicated with Dr. Arita Miss in plastic surgery regarding our issues getting a dermatology appointment; he was out of town but indicated that he would look into perhaps performing the biopsy in his office and will have his office contact her. 4/[name]/2023: The patient has an appointment in dermatology, but it is not until October. Her wounds are roughly the same; she continues to have very thick purulent-looking drainage on her dressings. 09/20/2021: The left medial wound is nearly closed and just has a bit of accumulated eschar on the surface. The right medial and lateral ankle wounds are perhaps a little bit smaller. They continue to have a very pale surface with accumulation of thin slough. 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 I did not think Jodie Echevaria was indicated based on this. She is getting  topical mupirocin with Prisma silver collagen. 10/04/2021: The patient was not seen in clinic last week due to childcare coverage issues. In the interim, the left medial leg wound has closed. The right sided leg wounds are smaller. There is more granulation tissue coming through, particularly on the lateral wound. The surface remains somewhat gritty. We have been applying topical mupirocin and Prisma silver collagen. 10/11/2021: The left medial leg wound remains closed. She does complain of some anesthetic sensation to the area. Both of the right-sided leg wounds are smaller but still have accumulated slough. 10/18/2021: Both right-sided leg wounds are minimally smaller this week. She still continues to accumulate slough and has thick drainage on her dressings. 10/23/2021: Both wounds continue to contract. There is still slough buildup. She has been approved for a keratin-based skin substitute trial product but it will not be available until next week. 10/30/2021: The wounds are about the same to perhaps slightly smaller. There is still continued slough buildup. Unfortunately, the rep for the keratin based product did not show up today and did not answer his phone when called. 11/08/2021: The wounds are little bit smaller today. She continues to have thick drainage but the surfaces are relatively clean with just a little bit of slough accumulation. She reported to me today that she is unable to completely flex her left ankle and on examination it seems this is potentially related to scar tissue from her wounds. We do have the ProgenaMatrix trial product available for her today. 11/15/2021: Both wounds are smaller today. There is some slough accumulation on the surfaces, but the medial wound, in particular looks like it is filling in and is less deep. She did hear from physical therapy and she is going to start working with them on July 11. She is here for her second application of the trial skin substitute,  ProgenaMatrix. 11/22/2021: Both wounds continue to contract, the medial more dramatically than the lateral. Both wounds have a layer of slough on the surface, but underneath this, the gritty fibrous tissue has a little bit more of a pink cast to it rather than being as pale as it has been. 11/29/2021: The wounds are roughly the same size this week, perhaps a millimeter or 2 smaller. The medial wound has filled in and is nearly flush with the surrounding skin surface. She continues to have a lot of slough accumulation on both surfaces. [name], [name] ([id number]) [id number] Page 12 of 18 12/06/2021: No significant change to her  wounds, but she has a new opening on her dorsal foot, just distal to the right lateral ankle wound. The area on her left medial ankle that reopened looks a little bit larger today. She has quite a bit of pain associated with the new wound. 12/12/2021: Her wounds look about the same but the new opening on her right lateral dorsal foot is a little bit bigger. She continues to have a fair amount of pain with this wound. 12/26/2021: The left medial ankle wound is tiny and superficial. She has 2 areas of crusting on her left lateral ankle, however, that appear to be threatening to open again. Her right medial ankle wound is a little bit smaller today but still continues to accumulate thick rubbery slough. The new dorsal foot wound is exquisitely painful but there is no odor or purulent drainage. No erythema or induration. The right lateral ankle wound looks about the same today, again with thick rubbery slough. 01/03/2022: The left medial ankle wound has closed again. Both right ankle wounds appear to be about the same size with thick rubbery slough. The dorsal foot wound on the right continues to be quite painful and she stated that she did not want any debridement of that site today. 01/10/2022: No real change to any of her wounds. She continues to accumulate  thick slough. The dorsal foot wound has merged with the lateral malleolar wound. She is experiencing significant pain in the dorsal foot portion of the ulcer. 01/16/2022: Absolutely no change or progress in her wounds. 01/24/2022: Her wounds are unchanged. She continues to build up slough and the wounds on her dorsal right foot are still exquisitely tender. 01/31/2022: The wounds actually measure a little bit narrower today. They still have thick slough on the surface but the underlying tissue seems a little less fibrotic. We changed to Iodosorb last week. 02/07/2022: Wounds continue to slowly epithelialize around the parameters. She has less pain today. She still accumulates a fairly substantial layer of slough. 02/15/2022: No change to the wounds overall. The measured a little bit larger today per the intake nurse. She continues to have substantial slough accumulation and her pain is a little bit worse. 02/21/2022. No change at all to her wounds. I do not see that plastic surgery has received her referral yet. 02/28/2022: The wounds remain unchanged. They are dry and fibrotic with accumulation of slough and eschar. She did receive an appointment to see plastic surgery on October 23, but the office called her back and indicated they needed to reschedule and that the next available appointment was not until December. The patient became angry and decided she did not want to see this plastics group. 10/19; the patient sees Dr. Maylene Roes again next week. She is using Medihoney as the primary dressing changing this herself. 03/21/2022: She saw Dr. Ferd Hibbs at the wound care center at [facility]. Dr. Ferd Hibbs was also unable to debride her, secondary to pain and is planning to take her to the operating room for operative debridement and potential skin substitute placement. Her wounds are unchanged with thick yellow slough and a fibrotic base. She says they are too painful to be debrided. In  addition, yesterday her hemoglobin was 4 and she received 2 units packed red blood cell transfusion. 04/04/2022: Her operation at [facility] is scheduled for December 15. She continues to use Medihoney on her wounds. They are a little bit less painful today and she is willing to entertain the possibility of  debridement. The wounds measured a little bit larger in all dimensions today but the layer of slough is not as thick as usual. 04/18/2022: Her wounds actually measured a little bit smaller today and the extension onto the dorsal foot from the lateral wound has healed. They are less tender and there is significantly less slough present as compared to prior visits. 05/09/2022: She had to reschedule her surgery due to lack of childcare. It is now planned for January 5. The wounds are basically unchanged, but she had a lot more drainage, which was thick and somewhat purulent in appearance. No significant odor. 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 They are quite painful today and she does not want to have debridement performed. 05/30/2022: The patient underwent surgical debridement and placement of TheraSkin to her wounds on January 5. She has been doing well and does not endorse any pain. The TheraSkin is intact and looks beautiful. It is sutured in place. There is no exudate or significant drainage. 06/04/2022: Her TheraSkin remains intact and I am starting to see granulation tissue emerging underneath the surface. No concern for infection. 06/12/2022: The TheraSkin is intact and there is more granulation tissue emerging. It is starting to look a little bit dry, however. No malodor or purulent drainage. 06/19/2022: The TheraSkin continues to look good. The edges are a bit dry, but the central portion of each of her wounds is showing a nice pink color. 06/27/2022: The TheraSkin on the lateral wound remains almost completely intact. There is nice pink tissue peaking through the mesh of the  TheraSkin. On the medial leg, it is starting to breakdown a little bit, but most of the TheraSkin remains intact here, as well. Both wounds measured smaller today. 07/03/2022: The wounds are fairly dry around the perimeter today. Some of the suture is hanging loose. 07/12/2022: Both wounds are measuring smaller today. The medial ulcer is getting a bit too dry. The lateral ulcer continues to have nice buds of granulation tissue emerging. 07/26/2022: The moisture balance on both wounds is much better today. For the first time since I began seeing her, I see actual beefy red granulation tissue on the lateral ankle wound. There are more buds of deep pink granulation tissue emerging on the medial ankle. There is some dry eschar around the edges of the medial wound and a tiny amount of slough overlying the good granulation tissue on the lateral wound. 08/09/2022: Unfortunately, she has a new wound on her left lateral lower leg, just above the ankle. It is extremely painful for her. It penetrates to the fat layer and has the same woody, fibrotic character as her previous wounds. The other wounds are both a little bit smaller with slough and some eschar around the periphery. 08/14/2022: Her right lateral ankle wound is a little bit smaller. Unfortunately, she has had an increase in her drainage and it has a purulent nature to it, similar to what was present prior to her surgical debridement. She also reports odor coming from her wounds this week. The left lateral leg wound remains extremely tender. 08/21/2022: The left lateral ankle wound is larger and more painful today. Both right ankle wounds have a little bit more vital appearance, but they have accumulated more slough. She reports less odor this week. She is currently taking Augmentin. 08/28/2022: The left lateral ankle wound continues to enlarge and remains quite painful. Both of the right ankle wounds actually look better this week. There is improved tissue  quality  with less slough accumulation. 09/27/2022: The left lateral ankle wound actually looks nearly healed. She says that she has been applying some silver alginate that she had leftover. She was unable to afford the Santyl that was prescribed, but found a tube that she had on hand at home and has been using this on her right ankle wounds. These wounds look about the same and have accumulated the usual layer of slough. Unfortunately, a portion of the dorsal foot wound has reopened and is exquisitely painful today. [name], [name] ([id number]) [id number] Page 13 of 18 10/09/2022: The left lateral ankle wound is healed. The dorsal foot wound appears to have expanded and remains exquisitely painful. The medial and lateral right ankle wounds look about the same size, but they are quite a bit cleaner. 10/23/2022: The left lateral ankle remains closed. The dorsal foot wound has expanded to the point that it now converges with the right lateral leg wound. She has accumulated fairly thick slough on all of the open surfaces. 11/06/2022: No significant change to any of her wounds, although the dorsal foot aspect of the right lateral wound is a little bit smaller. She has been approved for the [employer] and [employer] for [employer] and will be able to get a full year's supply. 11/20/2022: The dorsal foot aspect of the right lateral wound has almost completely closed. I am seeing nice pink tissue starting to emerge from the fibrotic surface of both wounds. The medial leg wound appears to be a little bit smaller today. She has been using Santyl. 12/04/2022: The dorsal foot aspect of the right lateral wound has closed. There is more pink granulation tissue emerging on her wounds. There are buds of epithelium beginning to fill in from the edges of her wound, particularly at the proximal aspect of the medial wound and the distal aspect of the lateral wound. Significantly less slough  formation than in the past. 12/18/2022: The wounds measured about the same size today, but there is better granulation tissue filling in and the overall surface consistency is improving. She does have slough accumulation on the surfaces, but no malodor or purulent drainage. 01/01/2023: No significant change to her wounds. The surface seems a little bit more fibrotic today. She is complaining of more pain. 8/[name]/2024: Her wounds are stable today. She has been taking Augmentin based on the culture that I took at her previous visit. She says that she has felt physically better since being on the antibiotic and has had less drainage from her wounds. Patient History Information obtained from Patient. Family History Diabetes - Mother, Lung Disease - Mother, No family history of Cancer, Heart Disease, Hereditary Spherocytosis, Hypertension, Kidney Disease, Seizures, Stroke, Thyroid Problems, Tuberculosis. Social History Never smoker, Marital Status - Married, Alcohol Use - Never, Drug Use - No History, Caffeine Use - Daily. Medical History Eyes Denies history of Cataracts, Glaucoma, Optic Neuritis Ear/Nose/Mouth/Throat Denies history of Chronic sinus problems/congestion, Middle ear problems Hematologic/Lymphatic Patient has history of Anemia, Sickle Cell Disease Denies history of Hemophilia, Human Immunodeficiency Virus, Lymphedema Respiratory Denies history of Aspiration, Asthma, Chronic Obstructive Pulmonary Disease (COPD), Pneumothorax, Sleep Apnea, Tuberculosis Cardiovascular Denies history of Angina, Arrhythmia, Congestive Heart Failure, Coronary Artery Disease, Deep Vein Thrombosis, Hypertension, Hypotension, Myocardial Infarction, Peripheral Arterial Disease, Peripheral Venous Disease, Phlebitis, Vasculitis Gastrointestinal Denies history of Cirrhosis , Colitis, Crohns, Hepatitis A, Hepatitis B, Hepatitis C Endocrine Denies history of Type I Diabetes, Type II Diabetes Genitourinary Denies  history of End Stage Renal Disease Immunological Denies  history of Lupus Erythematosus, Raynauds, Scleroderma Integumentary (Skin) Denies history of History of Burn Musculoskeletal Denies history of Gout, Rheumatoid Arthritis, Osteoarthritis, Osteomyelitis Neurologic Patient has history of Neuropathy - right foot intermittant Denies history of Dementia, Quadriplegia, Paraplegia, Seizure Disorder Oncologic Denies history of Received Chemotherapy, Received Radiation Psychiatric Denies history of Anorexia/bulimia, Confinement Anxiety Hospitalization/Surgery History - c section x2. - left breast lumpectomy. - iandD right ankle with theraskin. Medical A Surgical History Notes nd Constitutional Symptoms (General Health) H/O miscarriage Cardiovascular bradycardia Gastrointestinal [address] [name], [name] ([id number]) [id number] Page 14 of 18 Constitutional Hypertensive, asymptomatic. no acute distress. Vitals Time Taken: 10:21 AM, Height: 67 in, Weight: 134 lbs, BMI: 21, Temperature: 98.6 F, Pulse: 87 bpm, Respiratory Rate: 18 breaths/min, Blood Pressure: 150/74 mmHg. Respiratory Normal work of breathing on room air. General Notes: 8/[name]/2024: Her wounds are stable today. Integumentary (Hair, Skin) Wound #17 status is Open. Original cause of wound was Gradually Appeared. The date acquired was: 10/05/2012. The wound has been in treatment 91 weeks. The wound is located on the Right,Lateral Lower Leg. The wound measures 8.4cm length x 6.4cm width x 0.1cm depth; 42.223cm^2 area and 4.222cm^3 volume. There is Fat Layer (Subcutaneous Tissue) exposed. There is no tunneling or undermining noted. There is a medium amount of serosanguineous drainage noted. The wound margin is distinct with the outline attached to the wound base. There is small (1-33%) pink granulation within the wound bed. There is a large (67-100%) amount of necrotic tissue within the  wound bed including Adherent Slough. The periwound skin appearance had no abnormalities noted for moisture. The periwound skin appearance exhibited: Scarring, Hemosiderin Staining. The periwound skin appearance did not exhibit: Ecchymosis. Periwound temperature was noted as No Abnormality. The periwound has tenderness on palpation. Wound #21 status is Open. Original cause of wound was Gradually Appeared. The date acquired was: 06/26/2021. The wound has been in treatment 81 weeks. The wound is located on the Right,Medial Ankle. The wound measures 9.6cm length x 4.5cm width x 0.1cm depth; 33.929cm^2 area and 3.393cm^3 volume. There is Fat Layer (Subcutaneous Tissue) exposed. There is no tunneling or undermining noted. There is a medium amount of serosanguineous drainage noted. The wound margin is distinct with the outline attached to the wound base. There is small (1-33%) pink granulation within the wound bed. There is a large (67- 100%) amount of necrotic tissue within the wound bed including Adherent Slough. The periwound skin appearance had no abnormalities noted for moisture. The periwound skin appearance had no abnormalities noted for color. The periwound skin appearance exhibited: Scarring. Periwound temperature was noted as No Abnormality. The periwound has tenderness on palpation. 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 There was a Selective/Open Wound Non-Viable Tissue Debridement with a total area of 33.76 sq cm performed by Duanne Guess, MD. With the following instrument(s): Curette to remove Non-Viable tissue/material. Material removed includes [facility] after achieving pain control using Lidocaine 5% topical ointment. No specimens were taken. A time  out was conducted at 10:45, prior to the start of the procedure. A Minimum amount of bleeding was controlled with Pressure. The procedure was tolerated well. Post Debridement Measurements: 8.4cm length x 6.4cm width x 0.1cm depth; 4.222cm^3 volume. Character of Wound/Ulcer Post Debridement is improved. Post procedure Diagnosis Wound #17: Same as Pre-Procedure General Notes:  scribed for Dr. Lady Gary by Samuella Bruin, RN. Wound #21 Pre-procedure diagnosis of Wound #21 is a Sickle Cell Lesion located on the Right,Medial Ankle .Severity of Tissue Pre Debridement is: Fat layer exposed. There was a Selective/Open Wound Non-Viable Tissue Debridement with a total area of 27.13 sq cm performed by Duanne Guess, MD. With the following instrument(s): Curette to remove Non-Viable tissue/material. Material removed includes [facility] after achieving pain control using Lidocaine 5% topical ointment. No specimens were taken. A time out was conducted at 10:45, prior to the start of the procedure. A Minimum amount of bleeding was controlled with Pressure. The procedure was tolerated well. Post Debridement Measurements: 9.6cm length x 4.5cm width x 0.1cm depth; 3.393cm^3 volume. Character of Wound/Ulcer Post Debridement is improved. Severity of Tissue Post Debridement is: Fat layer exposed. Post procedure Diagnosis Wound #21: Same as Pre-Procedure General Notes: scribed for Dr. Lady Gary by Samuella Bruin, RN. Plan Follow-up Appointments: Return Appointment in 2 weeks. - Dr. Lady Gary Rm 2 Anesthetic: (In clinic) Topical Lidocaine 5% applied to wound bed Bathing/ Shower/ Hygiene: [name] shower and wash wound with soap and water. Edema Control - Lymphedema / SCD / Other: Elevate legs to the level of the heart or above for 30 minutes daily and/or when sitting for 3-4 times a day throughout the day. Avoid standing for long periods of time. Exercise regularly Additional Orders / Instructions: Follow Nutritious  Diet Juven Shake 1-2 times daily. [name], REDLINGER ([id number]) [id number] Page 15 of 18 The following medication(s) was prescribed: lidocaine topical 5 % ointment ointment topical was prescribed at facility amoxicillin-pot clavulanate oral 875 mg-125 mg tablet 1 tab p.o. twice daily x 20 days starting 8/[name]/2024 WOUND #17: - Lower Leg Wound Laterality: Right, Lateral Cleanser: Soap and Water 1 x Per Week/30 Days Discharge Instructions: [name] shower and wash wound with dial antibacterial soap and water prior to dressing change. Cleanser: Wound Cleanser 1 x Per Week/30 Days Discharge Instructions: Cleanse the wound with wound cleanser prior to applying a clean dressing using gauze sponges, not tissue or cotton balls. Peri-Wound Care: Triamcinolone 15 (g) 1 x Per Week/30 Days Discharge Instructions: Use triamcinolone 15 (g) as directed Peri-Wound Care: Sween Lotion (Moisturizing lotion) 1 x Per Week/30 Days Discharge Instructions: Apply moisturizing lotion as directed Prim Dressing: Santyl Ointment 1 x Per Week/30 Days ary Discharge Instructions: Apply nickel thick amount to wound bed as instructed Secondary Dressing: ABD Pad, 5x9 1 x Per Week/30 Days Discharge Instructions: Apply over primary dressing as directed. Secondary Dressing: Woven Gauze Sponge, Non-Sterile 4x4 in 1 x Per Week/30 Days Discharge Instructions: Moisten with normal saline and Apply over primary dressing as directed. Secured With: Elastic Bandage 4 inch (ACE bandage) 1 x Per Week/30 Days Discharge Instructions: Secure with ACE bandage as directed. Secured With: [employer], 4.5x3.1 (in/yd) 1 x Per Week/30 Days Discharge Instructions: Secure with Kerlix as directed. WOUND #21: - Ankle Wound Laterality: Right, Medial Cleanser: Soap and Water 1 x Per Week/30 Days Discharge Instructions: [name] shower and wash wound with dial antibacterial soap and water prior to dressing change. Cleanser: Wound  Cleanser 1 x Per Week/30 Days Discharge Instructions: Cleanse the wound with wound cleanser prior to applying a clean dressing using gauze sponges, not tissue or cotton balls. Peri-Wound Care: Triamcinolone 15 (g) 1 x Per Week/30 Days Discharge Instructions: Use triamcinolone 15 (g) as directed Peri-Wound Care: Sween Lotion (Moisturizing lotion) 1 x Per Week/30 Days Discharge Instructions: Apply moisturizing lotion as directed Prim Dressing: Santyl Ointment 1 x  Per Week/30 Days ary Discharge Instructions: Apply nickel thick amount to wound bed as instructed Secondary Dressing: ABD Pad, 5x9 1 x Per Week/30 Days Discharge Instructions: Apply over primary dressing as directed. Secondary Dressing: Woven Gauze Sponge, Non-Sterile 4x4 in 1 x Per Week/30 Days Discharge Instructions: Moisten with normal saline and Apply over primary dressing as directed. Secured With: Elastic Bandage 4 inch (ACE bandage) 1 x Per Week/30 Days Discharge Instructions: Secure with ACE bandage as directed. Secured With: [employer], 4.5x3.1 (in/yd) 1 x Per Week/30 Days Discharge Instructions: Secure with Kerlix as directed. 8/[name]/2024: Her wounds are stable today. She has been taking Augmentin based on the culture that I took at her previous visit. She says that she has felt physically better since being on the antibiotic and has had less drainage from her wounds. I used a curette to debride slough from all of the wound surfaces. We will continue to use Santyl and she will wrap her legs at home. She asked if I could extend her antibiotic course due to the improvement that she is feeling in her overall physical health as well as the decreased pain in her wound. I elected to do so for another 20 days to equal 1 month of therapy. She will follow-up in 2 weeks. Electronic Signature(s) Signed: 8/[name]/2024 11:[name]:49 AM By: Duanne Guess MD FACS Entered By: Duanne Guess on 08/[name]/2024  08:[name]:48 -------------------------------------------------------------------------------- HxROS Details Patient Name: Date of Service: [name] [name], [name] 8/[name]/2024 10:45 A M Medical Record Number: [id number] Patient Account Number: [id number] Date of Birth/Sex: Treating RN: [name] (52 y.o. F) Primary Care Provider: Julianne Handler Other Clinician: Referring Provider: Treating Provider/Extender: Koleen Nimrod Weeks in Treatment: 52 Information Obtained From Patient Constitutional Symptoms (General Health) Medical History: Past Medical History Notes: H/O miscarriage ELLAJEAN, KIEHNE ([id number]) [id number] Page 16 of 18 Eyes Medical History: Negative for: Cataracts; Glaucoma; Optic Neuritis Ear/Nose/Mouth/Throat Medical History: Negative for: Chronic sinus problems/congestion; Middle ear problems Hematologic/Lymphatic Medical History: Positive for: Anemia; Sickle Cell Disease Negative for: Hemophilia; Human Immunodeficiency Virus; Lymphedema Respiratory Medical History: Negative for: Aspiration; Asthma; Chronic Obstructive Pulmonary Disease (COPD); Pneumothorax; Sleep Apnea; Tuberculosis Cardiovascular Medical History: Negative for: Angina; Arrhythmia; Congestive Heart Failure; Coronary Artery Disease; Deep Vein Thrombosis; Hypertension; Hypotension; Myocardial Infarction; Peripheral Arterial Disease; Peripheral Venous Disease; Phlebitis; Vasculitis Past Medical History Notes: bradycardia Gastrointestinal Medical History: Negative for: Cirrhosis ; Colitis; Crohns; Hepatitis A; Hepatitis B; Hepatitis C Past Medical History Notes: cholilithiasis Endocrine Medical History: Negative for: Type I Diabetes; Type II Diabetes Genitourinary Medical History: Negative for: End Stage Renal Disease Immunological Medical History: Negative for: Lupus Erythematosus; Raynauds; Scleroderma Integumentary (Skin) Medical History: Negative  for: History of Burn Musculoskeletal Medical History: Negative for: Gout; Rheumatoid Arthritis; Osteoarthritis; Osteomyelitis Neurologic Medical History: Positive for: Neuropathy - right foot intermittant Negative for: Dementia; Quadriplegia; Paraplegia; Seizure Disorder Oncologic Medical History: Negative for: Received Chemotherapy; Received Radiation Psychiatric Medical History: Negative for: Anorexia/bulimia; Confinement Anxiety Immunizations Pneumococcal Vaccine: [name] ([id number]) [id number] Page 17 of 18 Received Pneumococcal Vaccination: No Implantable Devices None Hospitalization / Surgery History Type of Hospitalization/Surgery c section x2 left breast lumpectomy iandD right ankle with theraskin Family and Social History Cancer: No; Diabetes: Yes - Mother; Heart Disease: No; Hereditary Spherocytosis: No; Hypertension: No; Kidney Disease: No; Lung Disease: Yes - Mother; Seizures: No; Stroke: No; Thyroid Problems: No; Tuberculosis: No; Never smoker; Marital Status - Married; Alcohol Use: Never; Drug Use: No History; Caffeine Use: Daily; Financial Concerns: No; Food, Clothing or Shelter Needs:  No; Support System Lacking: No; Transportation Concerns: No Electronic Signature(s) Signed: 8/[name]/2024 3:38:16 PM By: Duanne Guess MD FACS Entered By: Duanne Guess on 08/[name]/2024 08:26:07 -------------------------------------------------------------------------------- SuperBill Details Patient Name: Date of Service: [name], [name] 8/[name]/2024 Medical Record Number: [id number] Patient Account Number: [id number] Date of Birth/Sex: Treating RN: [birth year] (52 y.o. F) Primary Care Provider: Julianne Handler Other Clinician: Referring Provider: Treating Provider/Extender: Koleen Nimrod Weeks in Treatment: 77 Diagnosis Coding ICD-10 Codes Code Description L97.818 Non-pressure chronic ulcer of other part of right lower  leg with other specified severity 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 Facility Procedures : CPT4 Code: [id number] Description: [phone] - DEBRIDE WOUND 1ST 20 SQ CM OR < ICD-10 Diagnosis Description L97.818 Non-pressure chronic ulcer of other part of right lower leg with other specified s Modifier: everity Quantity: 1 : CPT4 Code: [id number] Description: 28413 - DEBRIDE WOUND EA ADDL 20 SQ CM ICD-10 Diagnosis Description L97.818 Non-pressure chronic ulcer of other part of right lower leg with other specified s Modifier: everity Quantity: 3 Physician Procedures : CPT4 Code Description Modifier [id number] - WC PHYS LEVEL 4 - EST PT 25 ICD-10 Diagnosis Description L97.818 Non-pressure chronic ulcer of other part of right lower leg with other specified severity 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 Quantity: 1 : [id number] - WC PHYS DEBR WO ANESTH 20 SQ CM ICD-10 Diagnosis Description L97.818 Non-pressure chronic ulcer of other part of right lower leg with other specified severity Quantity: 1 : [id number] - WC PHYS DEBR WO ANESTH EA ADD 20 CM ICD-10 Diagnosis Description L97.818 Non-pressure chronic ulcer of other part of right lower leg with other specified severity JULLIANNA, WETTER ([id number]) [id number] P Quantity: 3 age [age] Electronic Signature(s) Signed: 8/[name]/2024 11:29:51 AM By: Duanne Guess MD FACS Entered By: Duanne Guess on 08/[name]/2024 08:29:50

## 2023-01-15 NOTE — Progress Notes (Signed)
SALITA, SUNDARAM ([id number]) [id number] Page 1 of 10 Visit Report for 01/15/2023 Arrival Information Details Patient Name: Date of Service: [name] [name] 01/15/2023 10:45 A M Medical Record Number: [id number] Patient Account Number: [id number] Date of Birth/Sex: Treating RN: Nov 12, 1970 (52 y.o. [name], [name] Primary Care [name]: [name] Other Clinician: Referring [name]: Treating [name]/Extender: [name] in Treatment: 39 Visit Information History Since Last Visit Added or deleted any medications: No Patient Arrived: [name] Any new allergies or adverse reactions: No Arrival Time: 10:21 Had a fall or experienced change in No Accompanied By: self activities of daily living that may affect Transfer Assistance: None risk of falls: Patient Identification Verified: Yes Signs or symptoms of abuse/neglect since last visito No Secondary Verification Process Completed: Yes Hospitalized since last visit: No Patient Requires Transmission-Based Precautions: No Implantable device outside of the clinic excluding No Patient Has Alerts: No cellular tissue based products placed in the center since last visit: Has Dressing in Place as Prescribed: Yes Has Compression in Place as Prescribed: Yes Pain Present Now: Yes Electronic Signature(s) Signed: 01/15/2023 3:26:24 PM By: Samuella Bruin Entered By: Samuella Bruin on 01/15/2023 07:21:22 -------------------------------------------------------------------------------- Encounter Discharge Information Details Patient Name: Date of Service: [name], [name] 01/15/2023 10:45 A M Medical Record Number: [id number] Patient Account Number: [id number] Date of Birth/Sex: Treating RN: 02/03/1971 (52 y.o. [name] Primary Care [name]: [name] Other Clinician: Referring [name]: Treating Fayette [name]/Extender: [name] in  Treatment: 39 Encounter Discharge Information Items Post Procedure Vitals Discharge Condition: Stable Temperature (F): 98.6 Ambulatory Status: Cane Pulse (bpm): 87 Discharge Destination: Home Respiratory Rate (breaths/min): 18 Transportation: Private Auto Blood Pressure (mmHg): 150/74 Accompanied By: self Schedule Follow-up Appointment: Yes Clinical Summary of Care: Patient Declined Electronic Signature(s) Signed: 01/15/2023 3:26:24 PM By: Samuella Bruin Entered By: Samuella Bruin on 01/15/2023 08:12:01 [name] ([id number]) [id number] Page 2 of 10 -------------------------------------------------------------------------------- Lower Extremity Assessment Details Patient Name: Date of Service: [name] [name] 01/15/2023 10:45 A M Medical Record Number: [id number] Patient Account Number: [id number] Date of Birth/Sex: Treating RN: Jul 10, 1970 (52 y.o. [name] Primary Care [name]: [name] Other Clinician: Referring Markevion Lattin: Treating [name]/Extender: [name] in Treatment: 96 Edema Assessment Assessed: [Left: No] [Right: No] Edema: [Left: N] [Right: o] Calf Left: Right: Point of Measurement: 33 cm From Medial Instep 31 cm Ankle Left: Right: Point of Measurement: 10 cm From Medial Instep 21.5 cm Vascular Assessment Pulses: Dorsalis Pedis Palpable: [Right:Yes] Electronic Signature(s) Signed: 01/15/2023 3:26:24 PM By: Samuella Bruin Entered By: Samuella Bruin on 01/15/2023 07:27:05 -------------------------------------------------------------------------------- Multi Wound Chart Details Patient Name: Date of Service: [name], [name] 01/15/2023 10:45 A M Medical Record Number: [id number] Patient Account Number: [id number] Date of Birth/Sex: Treating RN: Jun 26, 1970 (52 y.o. F) Primary Care [name]: [name] Other Clinician: Referring Laconya Clere: Treating  [name]/Extender: [name] in Treatment: 23 Vital Signs Height(in): 67 Pulse(bpm): 87 Weight(lbs): 134 Blood Pressure(mmHg): 150/74 Body Mass Index(BMI): 21 Temperature(F): 98.6 Respiratory Rate(breaths/min): 18 [17:Photos:] [N/A:N/A [phone].pdf Page 3 of 10] Right, Lateral Lower Leg Right, Medial Ankle N/A Wound Location: Gradually Appeared Gradually Appeared N/A Wounding Event: Sickle Cell Lesion Sickle Cell Lesion N/A Primary Etiology: N/A Venous Leg Ulcer N/A Secondary Etiology: Anemia, Sickle Cell Disease, Anemia, Sickle Cell Disease, N/A Comorbid History: Neuropathy Neuropathy 10/05/2012 06/26/2021 N/A Date Acquired: 26 81 N/A [name] of Treatment: Open Open N/A Wound Status: No No N/A Wound Recurrence: Yes No N/A  Clustered Wound: 8.4x6.4x0.1 9.6x4.5x0.1 N/A Measurements L x W x D (cm) 42.223 33.929 N/A A (cm) : rea 4.222 3.393 N/A Volume (cm) : 77.50% -17.10% N/A % Reduction in A rea: 77.50% -17.10% N/A % Reduction in Volume: Full Thickness Without Exposed Full Thickness Without Exposed N/A Classification: Support Structures Support Structures Medium Medium N/A Exudate A mount: Serosanguineous Serosanguineous N/A Exudate Type: red, brown red, brown N/A Exudate Color: Distinct, outline attached Distinct, outline attached N/A Wound Margin: Small (1-33%) Small (1-33%) N/A Granulation A mount: Pink Pink N/A Granulation Quality: Large (67-100%) Large (67-100%) N/A Necrotic A mount: Fat Layer (Subcutaneous Tissue): Yes Fat Layer (Subcutaneous Tissue): Yes N/A Exposed Structures: Fascia: No Fascia: No Tendon: No Tendon: No Muscle: No Muscle: No Joint: No Joint: No Bone: No Bone: No Small (1-33%) Small (1-33%) N/A Epithelialization: Debridement - Selective/Open Wound Debridement - Selective/Open Wound N/A Debridement: Pre-procedure Verification/Time Out 10:45 10:45 N/A Taken: Lidocaine 5%  topical ointment Lidocaine 5% topical ointment N/A Pain Control: [employer] N/A Tissue Debrided: Non-Viable Tissue Non-Viable Tissue N/A Level: 33.76 27.13 N/A Debridement A (sq cm): rea Curette Curette N/A Instrument: Minimum Minimum N/A Bleeding: Pressure Pressure N/A Hemostasis A chieved: Procedure was tolerated well Procedure was tolerated well N/A Debridement Treatment Response: 8.4x6.4x0.1 9.6x4.5x0.1 N/A Post Debridement Measurements L x W x D (cm) 4.222 3.393 N/A Post Debridement Volume: (cm) Scarring: Yes Scarring: Yes N/A Periwound Skin Texture: No Abnormalities Noted No Abnormalities Noted N/A Periwound Skin Moisture: Hemosiderin Staining: Yes No Abnormalities Noted N/A Periwound Skin Color: Ecchymosis: No No Abnormality No Abnormality N/A Temperature: Yes Yes N/A Tenderness on Palpation: Debridement Debridement N/A Procedures Performed: Treatment Notes Wound #17 (Lower Leg) Wound Laterality: Right, Lateral Cleanser Soap and Water Discharge Instruction: May shower and wash wound with dial antibacterial soap and water prior to dressing change. Wound Cleanser Discharge Instruction: Cleanse the wound with wound cleanser prior to applying a clean dressing using gauze sponges, not tissue or cotton balls. Peri-Wound Care Triamcinolone 15 (g) Discharge Instruction: Use triamcinolone 15 (g) as directed Sween Lotion (Moisturizing lotion) Discharge Instruction: Apply moisturizing lotion as directed Topical Primary Dressing Santyl Ointment Discharge Instruction: Apply nickel thick amount to wound bed as instructed Secondary Dressing ABD Pad, 5x9 Discharge Instruction: Apply over primary dressing as directed. Woven Gauze Sponge, Non-Sterile 4x4 in [place], [name] ([id number]) [id number] Page 4 of 10 Discharge Instruction: Moisten with normal saline and Apply over primary dressing as directed. Secured With Elastic Bandage 4 inch  (ACE bandage) Discharge Instruction: Secure with ACE bandage as directed. Kerlix Roll Sterile, 4.5x3.1 (in/yd) Discharge Instruction: Secure with Kerlix as directed. Compression Wrap Compression Stockings Add-Ons Wound #21 (Ankle) Wound Laterality: Right, Medial Cleanser Soap and Water Discharge Instruction: May shower and wash wound with dial antibacterial soap and water prior to dressing change. Wound Cleanser Discharge Instruction: Cleanse the wound with wound cleanser prior to applying a clean dressing using gauze sponges, not tissue or cotton balls. Peri-Wound Care Triamcinolone 15 (g) Discharge Instruction: Use triamcinolone 15 (g) as directed Sween Lotion (Moisturizing lotion) Discharge Instruction: Apply moisturizing lotion as directed Topical Primary Dressing Santyl Ointment Discharge Instruction: Apply nickel thick amount to wound bed as instructed Secondary Dressing ABD Pad, 5x9 Discharge Instruction: Apply over primary dressing as directed. Woven Gauze Sponge, Non-Sterile 4x4 in Discharge Instruction: Moisten with normal saline and Apply over primary dressing as directed. Secured With Elastic Bandage 4 inch (ACE bandage) Discharge Instruction: Secure with ACE bandage as directed. Kerlix Roll Sterile, 4.5x3.1 (in/yd) Discharge Instruction: Secure with Kerlix  as directed. Compression Wrap Compression Stockings Add-Ons Electronic Signature(s) Signed: 01/15/2023 11:24:33 AM By: Duanne Guess MD FACS Entered By: Duanne Guess on 01/15/2023 08:24:32 -------------------------------------------------------------------------------- Multi-Disciplinary Care Plan Details Patient Name: Date of Service: [name], [name] 01/15/2023 10:45 A M Medical Record Number: [id number] Patient Account Number: [id number] Date of Birth/Sex: Treating RN: [birth year] (52 y.o. [name] Primary Care Alley Neils: [name] Other Clinician: Referring [place] Wherry: Treating  Kenlyn Lose/Extender: [name] in Treatment: [address] [place] ([id number]) [id number] Page 5 of 10 Multidisciplinary Care Plan reviewed with physician Active Inactive Necrotic Tissue Nursing Diagnoses: Impaired tissue integrity related to necrotic/devitalized tissue Knowledge deficit related to management of necrotic/devitalized tissue Goals: Necrotic/devitalized tissue will be minimized in the wound bed Date Initiated: 12/04/2022 Target Resolution Date: 03/21/2023 Goal Status: Active Patient/caregiver will verbalize understanding of reason and process for debridement of necrotic tissue Date Initiated: 12/04/2022 Target Resolution Date: 03/21/2023 Goal Status: Active Interventions: Assess patient pain level pre-, during and post procedure and prior to discharge Provide education on necrotic tissue and debridement process Treatment Activities: Apply topical anesthetic as ordered : 12/04/2022 Enzymatic debridement : 12/04/2022 Notes: Electronic Signature(s) Signed: 01/15/2023 3:26:24 PM By: Samuella Bruin Entered By: Samuella Bruin on 01/15/2023 07:49:48 -------------------------------------------------------------------------------- Pain Assessment Details Patient Name: Date of Service: [name], [name] 01/15/2023 10:45 A M Medical Record Number: [id number] Patient Account Number: [id number] Date of Birth/Sex: Treating RN: [birth year] (52 y.o. [name] Primary Care Yaphet Smethurst: [name] Other Clinician: Referring Zykeria Laguardia: Treating Candiss Galeana/Extender: [name] in Treatment: 49 Active Problems Location of Pain Severity and Description of Pain Patient Has Paino Yes Site Locations Pain Location: Pain in Ulcers Duration of the Pain. Constant / Intermittento Intermittent Rate the pain. Current Pain Level: 5 Character of Pain [name], [name] ([id number])  [id number] Page 6 of 10 Describe the Pain: Burning Pain Management and Medication Current Pain Management: Electronic Signature(s) Signed: 01/15/2023 3:26:24 PM By: Samuella Bruin Entered By: Samuella Bruin on 01/15/2023 07:21:48 -------------------------------------------------------------------------------- Patient/Caregiver Education Details Patient Name: Date of Service: [name], [name] 8/28/2024andnbsp10:45 A M Medical Record Number: [id number] Patient Account Number: [id number] Date of Birth/Gender: Treating RN: 1970/11/13 (52 y.o. [name] Primary Care Physician: [name] Other Clinician: Referring Physician: Treating Physician/Extender: [name] in Treatment: 48 Education Assessment Education Provided To: Patient Education Topics Provided Wound/Skin Impairment: Methods: Explain/Verbal Responses: Reinforcements needed, State content correctly Electronic Signature(s) Signed: 01/15/2023 3:26:24 PM By: Samuella Bruin Entered By: Samuella Bruin on 01/15/2023 07:50:04 -------------------------------------------------------------------------------- Wound Assessment Details Patient Name: Date of Service: [name], [name] 01/15/2023 10:45 A M Medical Record Number: [id number] Patient Account Number: [id number] Date of Birth/Sex: Treating RN: 08-03-1970 (52 y.o. [name] Primary Care Jaquaya Coyle: [name] Other Clinician: Referring Priscilla Kirstein: Treating Issiac Jamar/Extender: [name] in Treatment: 62 Wound Status Wound Number: 17 Primary Etiology: Sickle Cell Lesion Wound Location: Right, Lateral Lower Leg Wound Status: Open Wounding Event: Gradually Appeared Comorbid History: Anemia, Sickle Cell Disease, Neuropathy Date Acquired: 10/05/2012 [name] Of Treatment: 91 Clustered Wound: Yes Photos [name] ([id number])  [id number] Page 7 of 10 Wound Measurements Length: (cm) 8.4 Width: (cm) 6.4 Depth: (cm) 0.1 Area: (cm) 42.223 Volume: (cm) 4.222 % Reduction in Area: 77.5% % Reduction in Volume: 77.5% Epithelialization: Small (1-33%) Tunneling: No Undermining: No Wound Description Classification: Full Thickness Without Exposed Support Structures Wound Margin: Distinct, outline attached Exudate Amount: Medium Exudate Type: Serosanguineous Exudate Color: red, brown Foul Odor  After Cleansing: No Slough/Fibrino Yes Wound Bed Granulation Amount: Small (1-33%) Exposed Structure Granulation Quality: Pink Fascia Exposed: No Necrotic Amount: Large (67-100%) Fat Layer (Subcutaneous Tissue) Exposed: Yes Necrotic Quality: Adherent Slough Tendon Exposed: No Muscle Exposed: No Joint Exposed: No Bone Exposed: No Periwound Skin Texture Texture Color No Abnormalities Noted: No No Abnormalities Noted: No Scarring: Yes Ecchymosis: No Hemosiderin Staining: Yes Moisture No Abnormalities Noted: Yes Temperature / Pain Temperature: No Abnormality Tenderness on Palpation: Yes Treatment Notes Wound #17 (Lower Leg) Wound Laterality: Right, Lateral Cleanser Soap and Water Discharge Instruction: May shower and wash wound with dial antibacterial soap and water prior to dressing change. Wound Cleanser Discharge Instruction: Cleanse the wound with wound cleanser prior to applying a clean dressing using gauze sponges, not tissue or cotton balls. Peri-Wound Care Triamcinolone 15 (g) Discharge Instruction: Use triamcinolone 15 (g) as directed Sween Lotion (Moisturizing lotion) Discharge Instruction: Apply moisturizing lotion as directed Topical Primary Dressing Santyl Ointment Discharge Instruction: Apply nickel thick amount to wound bed as instructed Secondary Dressing ABD Pad, 5x9 Discharge Instruction: Apply over primary dressing as directed. Woven Gauze Sponge, Non-Sterile  4x4 in Discharge Instruction: Moisten with normal saline and Apply over primary dressing as directed. [name], [name] ([id number]) [id number] Page 8 of 10 Secured With Elastic Bandage 4 inch (ACE bandage) Discharge Instruction: Secure with ACE bandage as directed. Kerlix Roll Sterile, 4.5x3.1 (in/yd) Discharge Instruction: Secure with Kerlix as directed. Compression Wrap Compression Stockings Add-Ons Electronic Signature(s) Signed: 01/15/2023 3:26:24 PM By: Samuella Bruin Entered By: Samuella Bruin on 01/15/2023 07:31:08 -------------------------------------------------------------------------------- Wound Assessment Details Patient Name: Date of Service: [name], [name] 01/15/2023 10:45 A M Medical Record Number: [id number] Patient Account Number: [id number] Date of Birth/Sex: Treating RN: 1970-10-24 (52 y.o. [name] Primary Care Julita Ozbun: [name] Other Clinician: Referring Gentry Pilson: Treating Jaleyah Longhi/Extender: [name] in Treatment: 22 Wound Status Wound Number: 21 Primary Etiology: Sickle Cell Lesion Wound Location: Right, Medial Ankle Secondary Etiology: Venous Leg Ulcer Wounding Event: Gradually Appeared Wound Status: Open Date Acquired: 06/26/2021 Comorbid History: Anemia, Sickle Cell Disease, Neuropathy [name] Of Treatment: 81 Clustered Wound: No Photos Wound Measurements Length: (cm) 9.6 Width: (cm) 4.5 Depth: (cm) 0.1 Area: (cm) 33.929 Volume: (cm) 3.393 % Reduction in Area: -17.1% % Reduction in Volume: -17.1% Epithelialization: Small (1-33%) Tunneling: No Undermining: No Wound Description Classification: Full Thickness Without Exposed Support Structures Wound Margin: Distinct, outline attached Exudate Amount: Medium Exudate Type: Serosanguineous Exudate Color: red, brown Foul Odor After Cleansing: No Slough/Fibrino Yes Wound Bed Granulation Amount: Small (1-33%)  Exposed Structure Granulation Quality: Pink Fascia Exposed: No Necrotic Amount: Large (67-100%) Fat Layer (Subcutaneous Tissue) Exposed: Yes Necrotic Quality: Adherent Slough Tendon Exposed: No [name] ([id number]) [phone].pdf Page 9 of 10 Muscle Exposed: No Joint Exposed: No Bone Exposed: No Periwound Skin Texture Texture Color No Abnormalities Noted: No No Abnormalities Noted: Yes Scarring: Yes Temperature / Pain Temperature: No Abnormality Moisture No Abnormalities Noted: Yes Tenderness on Palpation: Yes Treatment Notes Wound #21 (Ankle) Wound Laterality: Right, Medial Cleanser Soap and Water Discharge Instruction: May shower and wash wound with dial antibacterial soap and water prior to dressing change. Wound Cleanser Discharge Instruction: Cleanse the wound with wound cleanser prior to applying a clean dressing using gauze sponges, not tissue or cotton balls. Peri-Wound Care Triamcinolone 15 (g) Discharge Instruction: Use triamcinolone 15 (g) as directed Sween Lotion (Moisturizing lotion) Discharge Instruction: Apply moisturizing lotion as directed Topical Primary Dressing Santyl Ointment Discharge Instruction: Apply nickel thick amount to  wound bed as instructed Secondary Dressing ABD Pad, 5x9 Discharge Instruction: Apply over primary dressing as directed. Woven Gauze Sponge, Non-Sterile 4x4 in Discharge Instruction: Moisten with normal saline and Apply over primary dressing as directed. Secured With Elastic Bandage 4 inch (ACE bandage) Discharge Instruction: Secure with ACE bandage as directed. Kerlix Roll Sterile, 4.5x3.1 (in/yd) Discharge Instruction: Secure with Kerlix as directed. Compression Wrap Compression Stockings Add-Ons Electronic Signature(s) Signed: 01/15/2023 3:26:24 PM By: Samuella Bruin Entered By: Samuella Bruin on 01/15/2023  07:31:47 -------------------------------------------------------------------------------- Vitals Details Patient Name: Date of Service: [name], [name] 01/15/2023 10:45 A M Medical Record Number: [id number] Patient Account Number: [id number] Date of Birth/Sex: Treating RN: 03/14/1971 (52 y.o. [name] Primary Care Danalee Flath: [name] Other Clinician: Referring Jakota Manthei: Treating Jezabella Schriever/Extender: [name] in Treatment: [address] [place], [name] ([id number]) [id number] Page 10 of 10 Time Taken: 10:21 Temperature (F): 98.6 Height (in): 67 Pulse (bpm): 87 Weight (lbs): 134 Respiratory Rate (breaths/min): 18 Body Mass Index (BMI): 21 Blood Pressure (mmHg): 150/74 Reference Range: 80 - 120 mg / dl Electronic Signature(s) Signed: 01/15/2023 3:26:24 PM By: Samuella Bruin Entered By: Samuella Bruin on 01/15/2023 07:21:35

## 2023-01-23 ENCOUNTER — Telehealth: Payer: Self-pay | Admitting: Family Medicine

## 2023-01-23 NOTE — Telephone Encounter (Signed)
Caller & Relationship to patient:  MRN #  [id number]   Call Back Number:   Date of Last Office Visit: 12/24/2022     Date of Next Office Visit: 04/01/2023    Medication(s) to be Refilled: Oxycodone   Preferred Pharmacy:   ** Please notify patient to allow 48-72 hours to process** **Let patient know to contact pharmacy at the end of the day to make sure medication is ready. ** **If patient has not been seen in a year or longer, book an appointment **Advise to use MyChart for refill requests OR to contact their pharmacy

## 2023-01-24 ENCOUNTER — Other Ambulatory Visit: Payer: Self-pay | Admitting: Family Medicine

## 2023-01-24 DIAGNOSIS — G894 Chronic pain syndrome: Secondary | ICD-10-CM

## 2023-01-24 MED ORDER — OXYCODONE HCL 10 MG PO TABS
10.0000 mg | ORAL_TABLET | ORAL | 0 refills | Status: AC
Start: 2023-01-24 — End: ?

## 2023-01-24 NOTE — Progress Notes (Signed)
Reviewed PDMP substance reporting system prior to prescribing opiate medications. No inconsistencies noted.  Meds ordered this encounter  Medications   Oxycodone HCl 10 MG TABS    Sig: Take 1 tablet (10 mg total) by mouth every 4 (four) hours while awake.    Dispense:  90 tablet    Refill:  0    Order Specific Question:   Supervising Provider    Answer:   JEGEDE, OLUGBEMIGA E [[id number]]   Lachina Moore Hollis  APRN, MSN, FNP-C [facility] [address]  [place], [place] [address] [phone]  

## 2023-01-24 NOTE — Telephone Encounter (Signed)
Please send Rx to [facility] on [address]

## 2023-01-29 ENCOUNTER — Encounter (HOSPITAL_BASED_OUTPATIENT_CLINIC_OR_DEPARTMENT_OTHER): Payer: Medicaid Other | Attending: General Surgery | Admitting: General Surgery

## 2023-01-29 DIAGNOSIS — I10 Essential (primary) hypertension: Secondary | ICD-10-CM | POA: Insufficient documentation

## 2023-01-29 DIAGNOSIS — L97822 Non-pressure chronic ulcer of other part of left lower leg with fat layer exposed: Secondary | ICD-10-CM | POA: Diagnosis not present

## 2023-01-29 DIAGNOSIS — D571 Sickle-cell disease without crisis: Secondary | ICD-10-CM | POA: Diagnosis not present

## 2023-01-29 DIAGNOSIS — I82531 Chronic embolism and thrombosis of right popliteal vein: Secondary | ICD-10-CM | POA: Insufficient documentation

## 2023-01-29 DIAGNOSIS — L97818 Non-pressure chronic ulcer of other part of right lower leg with other specified severity: Secondary | ICD-10-CM | POA: Insufficient documentation

## 2023-01-29 DIAGNOSIS — I872 Venous insufficiency (chronic) (peripheral): Secondary | ICD-10-CM | POA: Insufficient documentation

## 2023-01-29 DIAGNOSIS — L97828 Non-pressure chronic ulcer of other part of left lower leg with other specified severity: Secondary | ICD-10-CM | POA: Diagnosis not present

## 2023-01-29 NOTE — Progress Notes (Signed)
[name], [name] ([id number]) [id number] Page 1 of 18 Visit Report for 01/29/2023 Chief Complaint Document Details Patient Name: Date of Service: [name] [name] 01/29/2023 10:45 A M Medical Record Number: [id number] Patient Account Number: [id number] Date of Birth/Sex: Treating RN: June 19, 1970 (52 y.o. F) Primary Care Provider: Julianne Handler Other Clinician: Referring Provider: Treating Provider/Extender: Koleen Nimrod Weeks in Treatment: 74 Information Obtained from: Patient Chief Complaint the patient is here for evaluation of her bilateral lower extremity sickle cell ulcers 04/17/2021; patient comes in for substantial wounds on the right and left lower leg Electronic Signature(s) Signed: 01/29/2023 11:04:31 AM By: Duanne Guess MD FACS Entered By: Duanne Guess on 01/29/2023 11:04:31 -------------------------------------------------------------------------------- Debridement Details Patient Name: Date of Service: [name], [name] 01/29/2023 10:45 A M Medical Record Number: [id number] Patient Account Number: [id number] Date of Birth/Sex: Treating RN: Jun 01, 1970 (52 y.o. [name] Primary Care Provider: Julianne Handler Other Clinician: Referring Provider: Treating Provider/Extender: Koleen Nimrod Weeks in Treatment: 93 Debridement Performed for Assessment: Wound #21 Right,Medial Ankle Performed By: Physician Duanne Guess, MD The following information was scribed by: Samuella Bruin The information was scribed for: Duanne Guess Debridement Type: Debridement Severity of Tissue Pre Debridement: Fat layer exposed Level of Consciousness (Pre-procedure): Awake and Alert Pre-procedure Verification/Time Out Yes - 10:44 Taken: Start Time: 10:44 Pain Control: Lidocaine 5% topical ointment Percent of Wound Bed Debrided: 75% T Area Debrided (cm): otal 24.73 Tissue and other material  debrided: Non-Viable, Slough, Slough Level: Non-Viable Tissue Debridement Description: Selective/Open Wound Instrument: Curette Bleeding: Minimum Hemostasis Achieved: Pressure Response to Treatment: Procedure was tolerated well Level of Consciousness (Post- Awake and Alert procedure): Post Debridement Measurements of Total Wound Length: (cm) 10.5 Width: (cm) 4 Depth: (cm) 0.1 Volume: (cm) 3.299 Tellez, Zyana ([id number]) [id number] Page 2 of 18 Character of Wound/Ulcer Post Debridement: Improved Severity of Tissue Post Debridement: Fat layer exposed Post Procedure Diagnosis Same as Pre-procedure Electronic Signature(s) Signed: 01/29/2023 1:02:16 PM By: Duanne Guess MD FACS Signed: 01/29/2023 4:54:20 PM By: Samuella Bruin Entered By: Samuella Bruin on 01/29/2023 10:45:45 -------------------------------------------------------------------------------- Debridement Details Patient Name: Date of Service: [name] [name], [name] 01/29/2023 10:45 A M Medical Record Number: [id number] Patient Account Number: [id number] Date of Birth/Sex: Treating RN: 1970-08-06 (52 y.o. [name] Primary Care Provider: Julianne Handler Other Clinician: Referring Provider: Treating Provider/Extender: Koleen Nimrod Weeks in Treatment: 93 Debridement Performed for Assessment: Wound #17 Right,Lateral Lower Leg Performed By: Physician Duanne Guess, MD The following information was scribed by: Samuella Bruin The information was scribed for: Duanne Guess Debridement Type: Debridement Level of Consciousness (Pre-procedure): Awake and Alert Pre-procedure Verification/Time Out Yes - 10:44 Taken: Start Time: 10:44 Pain Control: Lidocaine 5% topical ointment Percent of Wound Bed Debrided: 50% T Area Debrided (cm): otal 26.6 Tissue and other material debrided: Non-Viable, Slough, Slough Level: Non-Viable Tissue Debridement  Description: Selective/Open Wound Instrument: Curette Bleeding: Minimum Hemostasis Achieved: Pressure Response to Treatment: Procedure was tolerated well Level of Consciousness (Post- Awake and Alert procedure): Post Debridement Measurements of Total Wound Length: (cm) 8.8 Width: (cm) 7.7 Depth: (cm) 0.1 Volume: (cm) 5.322 Character of Wound/Ulcer Post Debridement: Improved Post Procedure Diagnosis Same as Pre-procedure Electronic Signature(s) Signed: 01/29/2023 1:02:16 PM By: Duanne Guess MD FACS Signed: 01/29/2023 4:54:20 PM By: Samuella Bruin Entered By: Samuella Bruin on 01/29/2023 10:52:39 HPI Details -------------------------------------------------------------------------------- [name] ([id number]) [id number] Page 3 of 18 Patient Name: Date of Service: [name], [place] [name] 01/29/2023 10:45 A M Medical Record  Number: [id number] Patient Account Number: [id number] Date of Birth/Sex: Treating RN: [birth year] (52 y.o. F) Primary Care Provider: Julianne Handler Other Clinician: Referring Provider: Treating Provider/Extender: Koleen Nimrod Weeks in Treatment: 74 History of Present Illness Location: medial and lateral ankle region on the right and left medial malleolus Quality: Patient reports experiencing a shooting pain to affected area(s). Severity: Patient states wound(s) are getting worse. Duration: right lower extremity bimalleolar ulcers have been present for approximately 2 years; the right medial malleolus ulcer has been there proximally 6 months Timing: Pain in wound is constant (hurts all the time) Context: The wound would happen gradually ssociated Signs and Symptoms: Patient reports having increase discharge. A HPI Description: 52 year old patient with a history of sickle cell anemia who was last seen by me with ulceration of the right lower extremity above the ankle and was referred to Dr. Marzetta Board for  a surgical debridement as I was unable to do anything in the office due to excruciating pain. At that stage she was referred from the plastic surgery service to dermatology who treated her for a skin infection with doxycycline and then Levaquin and a local antibiotic ointment. I understand the patient has since developed ulceration on the left ankle both medial and lateral and was now referred back to the wound center as dermatology has finished the management. I do not have any notes from the dermatology department Old notes: 52 year old patient with a history of sickle cell anemia, pain bilateral lower extremities, right lower extremity ulcer and has a history of receiving a skin graft( Theraskin) several months ago. She has been visiting the wound center [facility] and was seen by Dr. Leanord Hawking and Dr. Marzetta Board. after prolonged conservator therapy between July 2016 and January 2017. She had been seen by the plastic surgeon and taken to the OR for debridement and application of Theraskin. She had 3 applications of Theraskin and was then treated with collagen. Prior to that she had a history of similar problems in 2014 and was treated conservatively. Had a reflux study done for the right lower extremity in August 2016 without reflux or DVT . Past medical history significant for sickle cell disease, anemia, leg ulcers, cholelithiasis,and has never been a smoker. Once the patient was discharged on the wound center she says within 2 or 3 weeks the problems recurred and she has been treating it conservatively. since I saw her 3 weeks ago at [facility] she has been unable to get her dressing material but has completed a course of doxycycline. 6/7/ 2017 -- lower extremity venous duplex reflux evaluation was done No evidence of SVT or DVT in the RLL. No venous incompetence in the RLL. No further vascular workup is indicated at this time. She was seen by Dr. Mina Marble, on 10/04/2015. She agreed with the  plan of taking her to the OR for debridement and application of theraskin and would also take biopsies to rule out pyoderma gangrenosum. Follow-up note dated May 31 received and she was status post application of Theraskin to multiple ulcers around the right ankle. Pathology did not show evidence of malignancy or pyoderma gangrenosum. She would continue to see as in the wound clinic for further care and see Dr. Marzetta Board as needed. The patient brought the biopsy report and it was consistent with stasis ulcer no evidence of malignancy and the comment was that there was some adjacent neovascularization, fibrosis and patchy perivascular chronic inflammation. 11/15/2015 -- today we applied her first application of Theraskin 11/30/15;  TheraSkin #2 12/13/2015 -- she is having a lot of pain locally and is here for possible application of a theraskin today. 01/16/2016 -- the patient has significant pain and has noticed despite in spite of all local care and oral pain medication. It is impossible to debride her in the office. 02/06/2016 -- I do not see any notes from Dr. Leta Baptist( the patient has not made a call to the office know as she heard from them) and the only visit to recently was with her PCP Dr. Gypsy Decant -- I saw her on 01/16/2016 and prescribed 90 tablets of oxycodone 10 mg and did lab work and screening for HIV. the HIV was negative and hemoglobin was 6.3 with a WBC count of 14.9 and hematocrit of 17.8 with platelets of 561. 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 The bimalleolar ulcers to the right lower extremity have been present for approximately 2 years, the left medial malleolus ulcer has been present approximately 6 months. She has followed with Dr.Thimmappa in the past and has had a total of 3 applications of Theraskin (01/2015, 09/2015, 06/17/16). She has also followed  with Dr. Meyer Russel here in the clinic and has received 2 applications of TheraSkin (11/10/15, 11/30/15). The patient does experience chronic, and is not amenable to debridement. 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 She is not currently on any antibiotic therapy and has not been treated with any recently. 07/17/2016 -- was seen by Dr. Leta Baptist of plastic surgery who saw her 2 weeks postop application of Theraskin #3. She had removed her dressing and asked her to apply silver alginate on alternate days and follow-up back with the wound center. Future debridements and application of skin substitute would have to be done in the hospital due to her high risk for anesthesia. READMISSION 04/17/2021 Patient is now a 52 year old woman that we have had in this clinic for a prolonged period of time and 2016-2017 and then again for 2 visits in February 2018. At that point she had wounds on the right lower leg predominantly medial. She had also been seen by plastic surgery Dr. Marzetta Board who I believe took her to the OR for operative debridement and application of TheraSkin in 2017. After she left our clinic she was followed for a very prolonged period of time in the wound care center in [facility] who then referred her ultimately to [facility] where she was seen by Dr. Mardene Speak. Again taken her to the OR for skin grafting which apparently did not take. She had multiple other attempts at dressings although I have not really looked over all of these notes in great detail. She has not been seen in a wound care center in about a year. She states over the last year in addition to her right lower leg she has developed wounds on the left lower leg quite extensive. She is using Xeroform to all of these wounds without really any improvement. She also has Medicaid which does not cover wound products. The patient has had vascular work-ups in the past including most recently on 03/28/2021  showing biphasic waveforms on the right triphasic at the PTA and biphasic at the dorsalis pedis on the left. She was unable to tolerate any degree of compression to do ABIs. Unfortunately TBI's were also not done. She had venous reflux studies done in 2017. This  did not show any evidence of a DVT or SVT and no venous incompetence was noted in the right leg at the time this was the only side with the wound As noted I did not look all over her old records. She apparently had a course of HBO and Baptist although I am not sure what the indication would have been. In any case she developed seizures and terminated treatment earlier. She is generally much more disabled than when we last saw her in clinic. She can no longer walk pretty much wheelchair-bound because predominantly of pain in the left hip. 04/24/2021; the patient tolerated the wraps we put on. We used Santyl and Hydrofera Blue under compression. I brought her back for a nurse visit for a change in dressing. With Medicaid we will have a hard time getting anything paid for and hence the need for compression. She arrives in clinic with all the wounds looking somewhat better in terms of surface 12/20; circumferential wound on the right from the lateral to the medial. She has open areas on the left medial and left lateral x2 on all of this with the same surface. This does not look completely healthy although she does have some epithelialization. She is not complaining of a lot of pain which is unusual for her CHANTIL, STABLES ([id number]) [id number] Page 4 of 18 sickle ulcers. I have not looked over her extensive records from [facility]. She had recent arterial studies and has a history of venous reflux studies I will need to look these over although I do not believe she has significant arterial disease 2023 05/22/2021; patient's wound areas measure slightly smaller. Still a lot of drainage coming from the right we have been  using Hydrofera Blue and Santyl with some improvement in the wound surfaces. She tells me she will be getting transfused later in the week for her underlying sickle cell anemia I have looked over her recent arterial studies which were done in the fall. This was in November and showed biphasic and triphasic waveforms but she could not tolerate ABIs because of pressure and unfortunately TBI's were not done. She has not had recent venous reflux studies that I can see 1/10; not much change about the same surface area. This has a yellowish surface to it very gritty. We have been using Santyl and Hydrofera Blue for a prolonged period. Culture I did last week showed methicillin sensitive staph aureus "rare". Our intake nurse reports greenish drainage which may be the Hydrofera Blue itself 1/17; wounds are continue to measure smaller although I am not sure about the accuracy here. Especially the areas on the right are covered in what looks to be a nonviable surface although she does have some epithelialization. Similarly she has areas on the left medial and left lateral ankle area which appear to have a better surface and perhaps are slightly smaller. We have been using Santyl and Hydrofera Blue. She cannot tolerate mechanical debridements She went for her reflux studies which showed significant reflux at the greater saphenous vein at the saphenofemoral junction as well as the greater saphenous vein in the proximal calf on the left she had reflux in the thigh and the common femoral vein and supra vein Fishel vein reflux in the greater saphenous vein. I will have vein and vascular look at this. My thoughts have been that these are likely sickle wounds. I looked through her old records from [facility] wound care center and then when she graduated to [facility]  wound care center where she saw Dr. Ronda Fairly and Dr. Mardene Speak. Although I can see she had reflux studies done I do not see that she actually saw a vein  and vascular. I went over the fact that she had operative debridements and actual skin grafting that did not take. I do not think these wounds have ever really progressed towards healing 1/31;Substantial wounds on the right ankle area. Hyper granulated very gritty adherent debris on the surface. 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 On the left she has no DVT or SVT deep vein reflux in the common femoral vein. Superficial vein reflux in the greater saphenous vein on although the vein diameter is not really all that large. I do not think there is anything that can be done with these although I am going to send her for consultation to vein and vascular. 2/7; Wound exam; substantial wound area on the right posterior ankle area and areas on the left medial ankle and left lateral ankle. I was able to debride the left medial ankle last week fairly aggressively and it is back this week to a completely nonviable surface She will see vascular surgery this Friday and I would like them to review the venous studies and also any comments on her arterial status. If they do not see an issue here I am going to refer her to plastic surgery for an operative debridement perhaps intraoperative ACell or Integra. Eventually she will require a deep tissue culture again 2/14; substantial wound area on the right posterior ankle, medial ankle. We have been using silver alginate The patient was seen by vein and vascular she had both venous reflux studies and arterial studies. 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 She had no evidence of superficial venous thrombosis. She did have superficial vein reflux at the saphenofemoral junction and the greater saphenous vein. On the left no evidence of a DVT no evidence of superficial venous throat thrombosis she did have deep vein reflux in the common femoral vein and superficial vein reflux in the greater saphenous vein but these were not felt to be amenable to ablation. In terms of arterial studies she had triphasic and wife biphasic wave waveforms bilaterally not felt to have a significant arterial issue. I do not get the feeling that they felt that any part of her nonhealing wounds were related to either arterial or venous issues. They did note that she had venous reflux at the right at the [facility] and [facility]. And also on the left there were reflux in the deep system at the common femoral vein and greater saphenous vein in the proximal thigh. Nothing amenable to ablation. 2/20; she is making some decent progress on the right where there is nice skin between the 2 open areas on the right ankle. The surfaces here do not look viable yet there is some surrounding epithelialization. She still has a small area on the left medial ankle area. Hyper-granulated Jody's away always 2/28 patient has an appointment with plastic surgery on 3/8. We  will see her back on 3/9. She may have to call us to get the area redressed. We've been using Santyl under silver alginate. We made a nice improvement on the left medial ankle. The larger wounds on the right also looks somewhat better in terms of epithelialization although I think they could benefit from an aggressive debridement if plastic surgery would be willing to do that. Perhaps placement of Integra or a cell 07/26/2021: She saw Dr. Arita Miss yesterday. He raised the question as to whether or not this might be pyoderma and wanted to wait until that question was answered by dermatology before proceeding with any sort  of operative debridement. We have continue to use Santyl under silver alginate with Kerlix and Coban wraps. Overall, her wounds appear to be continuing to contract and epithelialize, with some granulation tissue present. There continues to be some slough on all wound surfaces. 08/09/2021: She has not been able to get an appointment with dermatology because apparently the offices in [facility] and [place] do not accept Medicaid. She is looking into whether or not she can be seen at the main [facility] dermatology clinic. This is necessary because plastic surgery is concerned that her wounds might represent pyoderma and they did not want to do any procedure until that was clarified. We have been using Santyl under silver alginate with Kerlix and Coban wraps. Today, there was a greater amount of drainage on her dressings with a slight green discoloration and significant odor. Despite this, her wounds continue to contract and epithelialize. There is pale granulation tissue present and actually, on the left medial ankle, the granulation tissue is a bit hypertrophic. 08/16/2021: Last week, I took a culture and this grew back rare methicillin-resistant Staph aureus and rare corynebacterium. The MRSA was sensitive to gentamicin which we began applying topically on an empiric basis. This week, her wounds are a bit smaller and the drainage and odor are less. Her primary care provider is working on assisting the patient with a dermatology evaluation. She has been in silver alginate over the gentamicin that was started last week along with Kerlix and Coban wraps. 08/23/2021: Because she has Medicaid, we have been unable to get her into see any dermatologist in the [place] to rule out pyoderma gangrenosum, which was a requirement from plastic surgery prior to any sort of debridement and grafting. Despite this, however, all of her wounds continue to get smaller. The wound on her left medial ankle is nearly  closed. There is no odor from the wounds, although she still accumulates a modest amount of drainage on her dressings. 08/30/2021: The lateral right ankle wound and the medial left ankle wound are a bit smaller today. The medial right ankle wound is about the same size. They are less tender. We have still been unable to get her into dermatology. 09/06/2021: All of the wounds are about the same size today. She continues to endorse minimal pain. I communicated with Dr. Arita Miss in plastic surgery regarding our issues getting a dermatology appointment; he was out of town but indicated that he would look into perhaps performing the biopsy in his office and will have his office contact her. 09/14/2021: The patient has an appointment in dermatology, but it is not until October. Her wounds are roughly the same; she continues to have very thick [place], Felecia Shelling ([id number]) [phone].pdf Page 5 of 18 purulent-looking drainage on her dressings. 09/20/2021: The left medial wound is nearly closed and just has a bit of accumulated  eschar on the surface. The right medial and lateral ankle wounds are perhaps a little bit smaller. They continue to have a very pale surface with accumulation of thin slough. 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 I did not think Jodie Echevaria was indicated based on this. She is getting topical mupirocin with Prisma silver collagen. 10/04/2021: The patient was not seen in clinic last week due to childcare coverage issues. In the interim, the left medial leg wound has closed. The right sided leg wounds are smaller. There is more granulation tissue coming through, particularly on the lateral wound. The surface remains somewhat gritty. We have been applying topical mupirocin and Prisma silver collagen. 10/11/2021: The left medial leg wound remains closed. She does complain of some anesthetic sensation to the area. Both of the right-sided leg wounds  are smaller but still have accumulated slough. 10/18/2021: Both right-sided leg wounds are minimally smaller this week. She still continues to accumulate slough and has thick drainage on her dressings. 10/23/2021: Both wounds continue to contract. There is still slough buildup. She has been approved for a keratin-based skin substitute trial product but it will not be available until next week. 10/30/2021: The wounds are about the same to perhaps slightly smaller. There is still continued slough buildup. Unfortunately, the rep for the keratin based product did not show up today and did not answer his phone when called. 11/08/2021: The wounds are little bit smaller today. She continues to have thick drainage but the surfaces are relatively clean with just a little bit of slough accumulation. She reported to me today that she is unable to completely flex her left ankle and on examination it seems this is potentially related to scar tissue from her wounds. We do have the ProgenaMatrix trial product available for her today. 11/15/2021: Both wounds are smaller today. There is some slough accumulation on the surfaces, but the medial wound, in particular looks like it is filling in and is less deep. She did hear from physical therapy and she is going to start working with them on July 11. She is here for her second application of the trial skin substitute, ProgenaMatrix. 11/22/2021: Both wounds continue to contract, the medial more dramatically than the lateral. Both wounds have a layer of slough on the surface, but underneath this, the gritty fibrous tissue has a little bit more of a pink cast to it rather than being as pale as it has been. 11/29/2021: The wounds are roughly the same size this week, perhaps a millimeter or 2 smaller. The medial wound has filled in and is nearly flush with the surrounding skin surface. She continues to have a lot of slough accumulation on both surfaces. 12/06/2021: No significant  change to her wounds, but she has a new opening on her dorsal foot, just distal to the right lateral ankle wound. The area on her left medial ankle that reopened looks a little bit larger today. She has quite a bit of pain associated with the new wound. 12/12/2021: Her wounds look about the same but the new opening on her right lateral dorsal foot is a little bit bigger. She continues to have a fair amount of pain with this wound. 12/26/2021: The left medial ankle wound is tiny and superficial. She has 2 areas of crusting on her left lateral ankle, however, that appear to be threatening to open again. Her right medial ankle wound is a little bit smaller today but still continues to accumulate  thick rubbery slough. The new dorsal foot wound is exquisitely painful but there is no odor or purulent drainage. No erythema or induration. The right lateral ankle wound looks about the same today, again with thick rubbery slough. 01/03/2022: The left medial ankle wound has closed again. Both right ankle wounds appear to be about the same size with thick rubbery slough. The dorsal foot wound on the right continues to be quite painful and she stated that she did not want any debridement of that site today. 01/10/2022: No real change to any of her wounds. She continues to accumulate thick slough. The dorsal foot wound has merged with the lateral malleolar wound. She is experiencing significant pain in the dorsal foot portion of the ulcer. 01/16/2022: Absolutely no change or progress in her wounds. 01/24/2022: Her wounds are unchanged. She continues to build up slough and the wounds on her dorsal right foot are still exquisitely tender. 01/31/2022: The wounds actually measure a little bit narrower today. They still have thick slough on the surface but the underlying tissue seems a little less fibrotic. We changed to Iodosorb last week. 02/07/2022: Wounds continue to slowly epithelialize around the parameters. She has less  pain today. She still accumulates a fairly substantial layer of slough. 02/15/2022: No change to the wounds overall. The measured a little bit larger today per the intake nurse. She continues to have substantial slough accumulation and her pain is a little bit worse. 02/21/2022. No change at all to her wounds. I do not see that plastic surgery has received her referral yet. 02/28/2022: The wounds remain unchanged. They are dry and fibrotic with accumulation of slough and eschar. She did receive an appointment to see plastic surgery on October 23, but the office called her back and indicated they needed to reschedule and that the next available appointment was not until December. The patient became angry and decided she did not want to see this plastics group. 10/19; the patient sees Dr. Maylene Roes again next week. She is using Medihoney as the primary dressing changing this herself. 03/21/2022: She saw Dr. Ferd Hibbs at the wound care center at [facility]. Dr. Ferd Hibbs was also unable to debride her, secondary to pain and is planning to take her to the operating room for operative debridement and potential skin substitute placement. Her wounds are unchanged with thick yellow slough and a fibrotic base. She says they are too painful to be debrided. In addition, yesterday her hemoglobin was 4 and she received 2 units packed red blood cell transfusion. 04/04/2022: Her operation at [facility] is scheduled for December 15. She continues to use Medihoney on her wounds. They are a little bit less painful today and she is willing to entertain the possibility of debridement. The wounds measured a little bit larger in all dimensions today but the layer of slough is not as thick as usual. 04/18/2022: Her wounds actually measured a little bit smaller today and the extension onto the dorsal foot from the lateral wound has healed. They are less tender and there is significantly less slough present as  compared to prior visits. 05/09/2022: She had to reschedule her surgery due to lack of childcare. It is now planned for January 5. The wounds are basically unchanged, but she had a lot more drainage, which was thick and somewhat purulent in appearance. No significant odor. 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 They are quite painful today and she does not want to have debridement performed.  05/30/2022: The patient underwent surgical debridement and placement of TheraSkin to her wounds on January 5. She has been doing well and does not endorse any pain. The TheraSkin is intact and looks beautiful. It is sutured in place. There is no exudate or significant drainage. 06/04/2022: Her TheraSkin remains intact and I am starting to see granulation tissue emerging underneath the surface. No concern for infection. SYENNA, PILGREEN ([id number]) [id number] Page 6 of 18 06/12/2022: The TheraSkin is intact and there is more granulation tissue emerging. It is starting to look a little bit dry, however. No malodor or purulent drainage. 06/19/2022: The TheraSkin continues to look good. The edges are a bit dry, but the central portion of each of her wounds is showing a nice pink color. 06/27/2022: The TheraSkin on the lateral wound remains almost completely intact. There is nice pink tissue peaking through the mesh of the TheraSkin. On the medial leg, it is starting to breakdown a little bit, but most of the TheraSkin remains intact here, as well. Both wounds measured smaller today. 07/03/2022: The wounds are fairly dry around the perimeter today. Some of the suture is hanging loose. 07/12/2022: Both wounds are measuring smaller today. The medial ulcer is getting a bit too dry. The lateral ulcer continues to have nice buds of granulation tissue emerging. 07/26/2022: The moisture balance on both wounds is much better today. For the first time since I began seeing her, I see actual  beefy red granulation tissue on the lateral ankle wound. There are more buds of deep pink granulation tissue emerging on the medial ankle. There is some dry eschar around the edges of the medial wound and a tiny amount of slough overlying the good granulation tissue on the lateral wound. 08/09/2022: Unfortunately, she has a new wound on her left lateral lower leg, just above the ankle. It is extremely painful for her. It penetrates to the fat layer and has the same woody, fibrotic character as her previous wounds. The other wounds are both a little bit smaller with slough and some eschar around the periphery. 08/14/2022: Her right lateral ankle wound is a little bit smaller. Unfortunately, she has had an increase in her drainage and it has a purulent nature to it, similar to what was present prior to her surgical debridement. She also reports odor coming from her wounds this week. The left lateral leg wound remains extremely tender. 08/21/2022: The left lateral ankle wound is larger and more painful today. Both right ankle wounds have a little bit more vital appearance, but they have accumulated more slough. She reports less odor this week. She is currently taking Augmentin. 08/28/2022: The left lateral ankle wound continues to enlarge and remains quite painful. Both of the right ankle wounds actually look better this week. There is improved tissue quality with less slough accumulation. 09/27/2022: The left lateral ankle wound actually looks nearly healed. She says that she has been applying some silver alginate that she had leftover. She was unable to afford the Santyl that was prescribed, but found a tube that she had on hand at home and has been using this on her right ankle wounds. These wounds look about the same and have accumulated the usual layer of slough. Unfortunately, a portion of the dorsal foot wound has reopened and is exquisitely painful today. 10/09/2022: The left lateral ankle wound is  healed. The dorsal foot wound appears to have expanded and remains exquisitely painful. The medial and lateral right ankle wounds look about the  same size, but they are quite a bit cleaner. 10/23/2022: The left lateral ankle remains closed. The dorsal foot wound has expanded to the point that it now converges with the right lateral leg wound. She has accumulated fairly thick slough on all of the open surfaces. 11/06/2022: No significant change to any of her wounds, although the dorsal foot aspect of the right lateral wound is a little bit smaller. She has been approved for the [employer] and [employer] for [employer] and will be able to get a full year's supply. 11/20/2022: The dorsal foot aspect of the right lateral wound has almost completely closed. I am seeing nice pink tissue starting to emerge from the fibrotic surface of both wounds. The medial leg wound appears to be a little bit smaller today. She has been using Santyl. 12/04/2022: The dorsal foot aspect of the right lateral wound has closed. There is more pink granulation tissue emerging on her wounds. There are buds of epithelium beginning to fill in from the edges of her wound, particularly at the proximal aspect of the medial wound and the distal aspect of the lateral wound. Significantly less slough formation than in the past. 12/18/2022: The wounds measured about the same size today, but there is better granulation tissue filling in and the overall surface consistency is improving. She does have slough accumulation on the surfaces, but no malodor or purulent drainage. 01/01/2023: No significant change to her wounds. The surface seems a little bit more fibrotic today. She is complaining of more pain. 01/15/2023: Her wounds are stable today. She has been taking Augmentin based on the culture that I took at her previous visit. She says that she has felt physically better since being on the antibiotic and has had less drainage from her  wounds. 01/29/2023: The wounds are unchanged and remained fibrotic and painful. She does report having less pain while on antibiotics and less drainage, as well. Electronic Signature(s) Signed: 01/29/2023 11:08:16 AM By: Duanne Guess MD FACS Entered By: Duanne Guess on 01/29/2023 11:08:16 -------------------------------------------------------------------------------- Physical Exam Details Patient Name: Date of Service: [name] [name], [name] 01/29/2023 10:45 A M Medical Record Number: [id number] Patient Account Number: [id number] Date of Birth/Sex: Treating RN: 15-Feb-1971 (52 y.o. F) Primary Care Provider: Julianne Handler Other Clinician: Referring Provider: Treating Provider/Extender: Koleen Nimrod Weeks in Treatment: 5 Constitutional . . . . no acute distress. SHAKILA, TOOLE ([id number]) [id number] Page 7 of 18 Respiratory Normal work of breathing on room air. Notes 01/29/2023: The wounds are unchanged and remained fibrotic and painful. Electronic Signature(s) Signed: 01/29/2023 11:10:22 AM By: Duanne Guess MD FACS Entered By: Duanne Guess on 01/29/2023 11:10:22 -------------------------------------------------------------------------------- Physician Orders Details Patient Name: Date of Service: [name] [name], [name] 01/29/2023 10:45 A M Medical Record Number: [id number] Patient Account Number: [id number] Date of Birth/Sex: Treating RN: [birth year] (52 y.o. 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 - Dr. Lady Gary Rm 2 Return A Anesthetic (In clinic) Topical Lidocaine 5% applied to wound bed Bathing/ Shower/ Hygiene May shower and wash wound with soap and water. Edema Control - Lymphedema / SCD /  Other Elevate legs to the level of the heart or above for 30 minutes daily and/or when sitting for 3-4 times a day throughout the day. Avoid standing for long periods of time. Exercise regularly Additional Orders / Instructions Follow Nutritious Diet Juven Shake 1-2 times daily. Wound Treatment Wound #17 - Lower Leg Wound Laterality: Right, Lateral Cleanser: Soap and Water 1 x Per Week/30 Days Discharge Instructions: May shower and wash wound with dial antibacterial soap and water prior to dressing change. Cleanser: Wound Cleanser 1 x Per Week/30 Days Discharge Instructions: Cleanse the wound with wound cleanser prior to applying a clean dressing using gauze sponges, not tissue or cotton balls. Peri-Wound Care: Triamcinolone 15 (g) 1 x Per Week/30 Days Discharge Instructions: Use triamcinolone 15 (g) as directed Peri-Wound Care: Sween Lotion (Moisturizing lotion) 1 x Per Week/30 Days Discharge Instructions: Apply moisturizing lotion as directed Prim Dressing: Santyl Ointment 1 x Per Week/30 Days ary Discharge Instructions: Apply nickel thick amount to wound bed as instructed Secondary Dressing: ABD Pad, 5x9 1 x Per Week/30 Days Discharge Instructions: Apply over primary dressing as directed. SAIDA, HUCKABAY ([id number]) [id number] Page 8 of 18 Secondary Dressing: Woven Gauze Sponge, Non-Sterile 4x4 in 1 x Per Week/30 Days Discharge Instructions: Moisten with normal saline and Apply over primary dressing as directed. Secured With: Elastic Bandage 4 inch (ACE bandage) 1 x Per Week/30 Days Discharge Instructions: Secure with ACE bandage as directed. Secured With: [employer], 4.5x3.1 (in/yd) 1 x Per Week/30 Days Discharge Instructions: Secure with Kerlix as directed. Wound #21 - Ankle Wound Laterality: Right, Medial Cleanser: Soap and Water 1 x Per Week/30 Days Discharge Instructions: May shower and wash wound with dial antibacterial soap and water prior to dressing change. Cleanser: Wound Cleanser 1 x Per Week/30 Days Discharge Instructions: Cleanse the wound with wound cleanser prior to applying a clean dressing using gauze sponges, not tissue or cotton balls. Peri-Wound Care: Triamcinolone 15 (g) 1 x Per Week/30 Days Discharge Instructions: Use triamcinolone 15 (g) as directed Peri-Wound Care: Sween Lotion (Moisturizing lotion) 1 x Per Week/30 Days Discharge Instructions: Apply moisturizing lotion as directed Prim Dressing: Santyl Ointment 1 x Per Week/30 Days ary Discharge Instructions: Apply nickel thick amount to wound bed as instructed Secondary Dressing: ABD Pad, 5x9 1 x Per Week/30 Days Discharge Instructions: Apply over primary dressing as directed. Secondary Dressing: Woven Gauze Sponge, Non-Sterile 4x4 in 1 x Per Week/30 Days Discharge Instructions: Moisten with normal saline and Apply over primary dressing as directed. Secured With: Elastic Bandage 4 inch (ACE bandage) 1 x Per Week/30 Days Discharge Instructions: Secure with ACE bandage as directed. Secured With: [employer], 4.5x3.1 (in/yd) 1 x Per Week/30 Days Discharge Instructions: Secure with Kerlix as directed. Patient Medications llergies: No Known Allergies A Notifications Medication Indication Start End 01/29/2023 lidocaine DOSE topical 5 % ointment - ointment topical Electronic Signature(s) Signed: 01/29/2023 1:02:16 PM By: Duanne Guess MD FACS Entered By: Duanne Guess on 01/29/2023 11:11:43 -------------------------------------------------------------------------------- Problem List Details Patient Name: Date of Service: [name] [name], [name] 01/29/2023 10:45 A  M Medical Record Number: [id number] Patient Account Number: [id number] Date of Birth/Sex: Treating RN: [birth year] (52 y.o. 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 04/17/2021 No Yes Inactive Problems ICD-10 Code Description Active Date Inactive Date L97.828 Non-pressure chronic ulcer of other part of left lower leg with other specified severity 04/17/2021 04/17/2021 Resolved Problems ICD-10 Code Description Active Date Resolved Date L97.822 Non-pressure chronic ulcer of other part of left lower leg with fat layer exposed 08/09/2022 08/09/2022 Electronic Signature(s) Signed: 01/29/2023 11:02:18 AM By: Duanne Guess MD FACS Entered By: Duanne Guess on 01/29/2023 11:02:18 -------------------------------------------------------------------------------- Progress Note Details Patient Name: Date of Service: [name] [name], [name] 01/29/2023 10:45 A M Medical Record Number: [id number] Patient Account Number: [id number] Date of Birth/Sex: Treating RN: 1970/11/22 (52 y.o. F) Primary Care Provider: Julianne Handler Other Clinician: Referring Provider: Treating Provider/Extender: Koleen Nimrod Weeks in Treatment: 73 Subjective Chief Complaint Information obtained from Patient the patient is here for evaluation of her bilateral lower extremity sickle cell ulcers 04/17/2021; patient comes in for substantial wounds on the right and left lower leg History of Present Illness (HPI) The following HPI elements were documented for the patient's  wound: Location: medial and lateral ankle region on the right and left medial malleolus Quality: Patient reports experiencing a shooting pain to affected area(s). Severity: Patient states wound(s) are getting worse. Duration: right lower extremity bimalleolar ulcers have been present for approximately 2 years; the right medial malleolus ulcer has been there proximally 6 months Timing: Pain in wound is constant (hurts all the time) Context: The wound would happen gradually Associated Signs and Symptoms: Patient reports having increase discharge. 52 year old patient with a history of sickle cell anemia who was last seen by me with ulceration of the right lower extremity above the ankle and was referred to Dr. Marzetta Board for a surgical debridement as I was unable to do anything in the office due to excruciating pain. At that stage she was referred from the plastic surgery service to dermatology who treated her for a skin infection with doxycycline and then Levaquin and a local antibiotic ointment. I understand the patient has since developed ulceration on the left ankle both medial and lateral and was now referred back to the wound center as dermatology has finished the management. I do not have any notes from the dermatology department Old notes: 52 year old patient with a history of sickle cell anemia, pain bilateral lower extremities, right lower extremity ulcer and has a history of receiving a skin graft( Theraskin) several months ago. She has been visiting the wound center [facility] and was seen by Dr. Leanord Hawking and Dr. Marzetta Board. after prolonged conservator therapy between July 2016 and January 2017. She had been seen by the plastic surgeon and taken to the OR for debridement and application of Theraskin. She had 3 applications of Theraskin and was then treated with collagen. Prior to that she had a history of similar problems in 2014 and was treated conservatively. Had a reflux study done for the  right lower extremity in August 2016 without reflux or DVT . Past medical history significant for sickle cell disease, anemia, leg ulcers, cholelithiasis,and has never been a smoker. COLBY, GARANT ([id number]) [id number] Page 10 of 18 Once the patient was discharged on the wound center she says within 2 or 3 weeks the problems recurred and she has been treating it conservatively. since I saw her 3 weeks ago at [facility] she has been unable to get her dressing material but has completed a course of doxycycline. 6/7/ 2017 -- lower extremity venous duplex reflux evaluation was done  No evidence of SVT  or DVT in the RLL. No venous incompetence in the RLL. No further vascular workup is indicated at this time. She was seen by Dr. Mina Marble, on 10/04/2015. She agreed with the plan of taking her to the OR for debridement and application of theraskin and would also take biopsies to rule out pyoderma gangrenosum. Follow-up note dated May 31 received and she was status post application of Theraskin to multiple ulcers around the right ankle. Pathology did not show evidence of malignancy or pyoderma gangrenosum. She would continue to see as in the wound clinic for further care and see Dr. Marzetta Board as needed. The patient brought the biopsy report and it was consistent with stasis ulcer no evidence of malignancy and the comment was that there was some adjacent neovascularization, fibrosis and patchy perivascular chronic inflammation. 11/15/2015 -- today we applied her first application of Theraskin 11/30/15; TheraSkin #2 12/13/2015 -- she is having a lot of pain locally and is here for possible application of a theraskin today. 01/16/2016 -- the patient has significant pain and has noticed despite in spite of all local care and oral pain medication. It is impossible to debride her in the office. 02/06/2016 -- I do not see any notes from Dr. Leta Baptist( the patient has not made a  call to the office know as she heard from them) and the only visit to recently was with her PCP Dr. Gypsy Decant -- I saw her on 01/16/2016 and prescribed 90 tablets of oxycodone 10 mg and did lab work and screening for HIV. the HIV was negative and hemoglobin was 6.3 with a WBC count of 14.9 and hematocrit of 17.8 with platelets of 561. 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 The bimalleolar ulcers to the right lower extremity have been present for approximately 2 years, the left medial malleolus ulcer has been present approximately 6 months. She has followed with Dr.Thimmappa in the past and has had a total of 3 applications of Theraskin (01/2015, 09/2015, 06/17/16). She has also followed with Dr. Meyer Russel here in the clinic and has received 2 applications of TheraSkin (11/10/15, 11/30/15). The patient does experience chronic, and is not amenable to debridement. 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 She is not currently on any antibiotic therapy and has not been treated with any recently. 07/17/2016 -- was seen by Dr. Leta Baptist of plastic surgery who saw her 2 weeks postop application of Theraskin #3. She had removed her dressing and asked her to apply silver alginate on alternate days and follow-up back with the wound center. Future debridements and application of skin substitute would have to be done in the hospital due to her high risk for anesthesia. READMISSION 04/17/2021 Patient is now a 52 year old woman that we have had in this clinic for a prolonged period of time and 2016-2017 and then again for 2 visits in February 2018. At that point she had wounds on the right lower leg predominantly medial. She had also been seen by plastic surgery Dr. Marzetta Board who I believe took her to the OR for operative debridement and application of  TheraSkin in 2017. After she left our clinic she was followed for a very prolonged period of time in the wound care center in [facility] who then referred her ultimately to [facility] where she was seen by Dr. Mardene Speak. Again taken her to the OR  for skin grafting which apparently did not take. She had multiple other attempts at dressings although I have not really looked over all of these notes in great detail. She has not been seen in a wound care center in about a year. She states over the last year in addition to her right lower leg she has developed wounds on the left lower leg quite extensive. She is using Xeroform to all of these wounds without really any improvement. She also has Medicaid which does not cover wound products. The patient has had vascular work-ups in the past including most recently on 03/28/2021 showing biphasic waveforms on the right triphasic at the PTA and biphasic at the dorsalis pedis on the left. She was unable to tolerate any degree of compression to do ABIs. Unfortunately TBI's were also not done. She had venous reflux studies done in 2017. This did not show any evidence of a DVT or SVT and no venous incompetence was noted in the right leg at the time this was the only side with the wound As noted I did not look all over her old records. She apparently had a course of HBO and [facility] although I am not sure what the indication would have been. In any case she developed seizures and terminated treatment earlier. She is generally much more disabled than when we last saw her in clinic. She can no longer walk pretty much wheelchair-bound because predominantly of pain in the left hip. 04/24/2021; the patient tolerated the wraps we put on. We used Santyl and Hydrofera Blue under compression. I brought her back for a nurse visit for a change in dressing. With Medicaid we will have a hard time getting anything paid for and hence the need for compression. She arrives in clinic with all the  wounds looking somewhat better in terms of surface 12/20; circumferential wound on the right from the lateral to the medial. She has open areas on the left medial and left lateral x2 on all of this with the same surface. This does not look completely healthy although she does have some epithelialization. She is not complaining of a lot of pain which is unusual for her sickle ulcers. I have not looked over her extensive records from [facility]. She had recent arterial studies and has a history of venous reflux studies I will need to look these over although I do not believe she has significant arterial disease 2023 05/22/2021; patient's wound areas measure slightly smaller. Still a lot of drainage coming from the right we have been using Hydrofera Blue and Santyl with some improvement in the wound surfaces. She tells me she will be getting transfused later in the week for her underlying sickle cell anemia I have looked over her recent arterial studies which were done in the fall. This was in November and showed biphasic and triphasic waveforms but she could not tolerate ABIs because of pressure and unfortunately TBI's were not done. She has not had recent venous reflux studies that I can see 1/10; not much change about the same surface area. This has a yellowish surface to it very gritty. We have been using Santyl and Hydrofera Blue for a prolonged period. Culture I did last week showed methicillin sensitive staph aureus "rare". Our intake nurse reports greenish drainage which may be the Hydrofera Blue itself 1/17; wounds are continue to measure smaller although I am not sure about the accuracy here. Especially the areas on the right are covered in what looks to be  a nonviable surface although she does have some epithelialization. Similarly she has areas on the left medial and left lateral ankle area which appear to have a better surface and perhaps are slightly smaller. We have been using Santyl and  Hydrofera Blue. She cannot tolerate mechanical debridements She went for her reflux studies which showed significant reflux at the greater saphenous vein at the saphenofemoral junction as well as the greater saphenous vein in the proximal calf on the left she had reflux in the thigh and the common femoral vein and supra vein Fishel vein reflux in the greater saphenous vein. I will have vein and vascular look at this. My thoughts have been that these are likely sickle wounds. I looked through her old records from [facility] wound care center and then when she graduated to [facility] wound care center where she saw Dr. Ronda Fairly and Dr. Mardene Speak. Although I can see she had reflux studies done I do not see that she actually saw a vein and vascular. I went over the fact that she had operative debridements and actual skin grafting that did not take. I do not think these wounds have ever really progressed towards healing 1/31;Substantial wounds on the right ankle area. Hyper granulated very gritty adherent debris on the surface. 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 On the left she has no DVT or SVT deep JAMAURIA, CHAPPELL ([id number]) [id number] Page 11 of 18 vein reflux in the common femoral vein. Superficial vein reflux in the greater saphenous vein on although the vein diameter is not really all that large. I do not think there is anything that can be done with these although I am going to send her for consultation to vein and vascular. 2/7; Wound exam; substantial wound area on the right posterior ankle area and  areas on the left medial ankle and left lateral ankle. I was able to debride the left medial ankle last week fairly aggressively and it is back this week to a completely nonviable surface She will see vascular surgery this Friday and I would like them to review the venous studies and also any comments on her arterial status. If they do not see an issue here I am going to refer her to plastic surgery for an operative debridement perhaps intraoperative ACell or Integra. Eventually she will require a deep tissue culture again 2/14; substantial wound area on the right posterior ankle, medial ankle. We have been using silver alginate The patient was seen by vein and vascular she had both venous reflux studies and arterial studies. 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 She had no evidence of superficial venous thrombosis. She did have superficial vein reflux at the saphenofemoral junction and the greater saphenous vein. On the left no evidence of a DVT no evidence of superficial venous throat thrombosis she did have deep vein reflux in the common femoral vein and superficial vein reflux in the greater saphenous vein but these were not felt to be amenable to ablation. In terms of arterial studies  she had triphasic and wife biphasic wave waveforms bilaterally not felt to have a significant arterial issue. I do not get the feeling that they felt that any part of her nonhealing wounds were related to either arterial or venous issues. They did note that she had venous reflux at the right at the [facility] and G[facility]. And also on the left there were reflux in the deep system at the common femoral vein and greater saphenous vein in the proximal thigh. Nothing amenable to ablation. 2/20; she is making some decent progress on the right where there is nice skin between the 2 open areas on the right ankle. The surfaces here do not look viable yet  there is some surrounding epithelialization. She still has a small area on the left medial ankle area. Hyper-granulated Jody's away always 2/28 patient has an appointment with plastic surgery on 3/8. We will see her back on 3/9. She may have to call us to get the area redressed. We've been using Santyl under silver alginate. We made a nice improvement on the left medial ankle. The larger wounds on the right also looks somewhat better in terms of epithelialization although I think they could benefit from an aggressive debridement if plastic surgery would be willing to do that. Perhaps placement of Integra or a cell 07/26/2021: She saw Dr. Arita Miss yesterday. He raised the question as to whether or not this might be pyoderma and wanted to wait until that question was answered by dermatology before proceeding with any sort of operative debridement. We have continue to use Santyl under silver alginate with Kerlix and Coban wraps. Overall, her wounds appear to be continuing to contract and epithelialize, with some granulation tissue present. There continues to be some slough on all wound surfaces. 08/09/2021: She has not been able to get an appointment with dermatology because apparently the offices in [facility] and [place] do not accept Medicaid. She is looking into whether or not she can be seen at the main [facility] dermatology clinic. This is necessary because plastic surgery is concerned that her wounds might represent pyoderma and they did not want to do any procedure until that was clarified. We have been using Santyl under silver alginate with Kerlix and Coban wraps. Today, there was a greater amount of drainage on her dressings with a slight green discoloration and significant odor. Despite this, her wounds continue to contract and epithelialize. There is pale granulation tissue present and actually, on the left medial ankle, the granulation tissue is a bit hypertrophic. 08/16/2021: Last  week, I took a culture and this grew back rare methicillin-resistant Staph aureus and rare corynebacterium. The MRSA was sensitive to gentamicin which we began applying topically on an empiric basis. This week, her wounds are a bit smaller and the drainage and odor are less. Her primary care provider is working on assisting the patient with a dermatology evaluation. She has been in silver alginate over the gentamicin that was started last week along with Kerlix and Coban wraps. 08/23/2021: Because she has Medicaid, we have been unable to get her into see any dermatologist in the [place] to rule out pyoderma gangrenosum, which was a requirement from plastic surgery prior to any sort of debridement and grafting. Despite this, however, all of her wounds continue to get smaller. The wound on her left medial ankle is nearly closed. There is no odor from the wounds, although she still accumulates a modest amount of drainage on her dressings. 08/30/2021: The lateral  right ankle wound and the medial left ankle wound are a bit smaller today. The medial right ankle wound is about the same size. They are less tender. We have still been unable to get her into dermatology. 09/06/2021: All of the wounds are about the same size today. She continues to endorse minimal pain. I communicated with Dr. Arita Miss in plastic surgery regarding our issues getting a dermatology appointment; he was out of town but indicated that he would look into perhaps performing the biopsy in his office and will have his office contact her. 09/14/2021: The patient has an appointment in dermatology, but it is not until October. Her wounds are roughly the same; she continues to have very thick purulent-looking drainage on her dressings. 09/20/2021: The left medial wound is nearly closed and just has a bit of accumulated eschar on the surface. The right medial and lateral ankle wounds are perhaps a little bit smaller. They continue to have a very pale  surface with accumulation of thin slough. 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 I did not think Jodie Echevaria was indicated based on this. She is getting topical mupirocin with Prisma silver collagen. 10/04/2021: The patient was not seen in clinic last week due to childcare coverage issues. In the interim, the left medial leg wound has closed. The right sided leg wounds are smaller. There is more granulation tissue coming through, particularly on the lateral wound. The surface remains somewhat gritty. We have been applying topical mupirocin and Prisma silver collagen. 10/11/2021: The left medial leg wound remains closed. She does complain of some anesthetic sensation to the area. Both of the right-sided leg wounds are smaller but still have accumulated slough. 10/18/2021: Both right-sided leg wounds are minimally smaller this week. She still continues to accumulate slough and has thick drainage on her dressings. 10/23/2021: Both wounds continue to contract. There is still slough buildup. She has been approved for a keratin-based skin substitute trial product but it will not be available until next week. 10/30/2021: The wounds are about the same to perhaps slightly smaller. There is still continued slough buildup. Unfortunately, the rep for the keratin based product did not show up today and did not answer his phone when called. 11/08/2021: The wounds are little bit smaller today. She continues to have thick drainage but the surfaces are relatively clean with just a little bit of slough accumulation. She reported to me today that she is unable to completely flex her left ankle and on examination it seems this is potentially related to scar tissue from her wounds. We do have the ProgenaMatrix trial product available for her today. 11/15/2021: Both wounds are smaller today. There is some slough accumulation on the surfaces, but the medial wound, in particular looks like it is filling in  and is less deep. She did hear from physical therapy and she is going to start working with them on July 11. She is here for her second application of the trial skin substitute, ProgenaMatrix. 11/22/2021: Both wounds continue to contract, the medial more dramatically than the lateral. Both wounds have a layer of slough on the surface, but underneath this, the gritty fibrous tissue has a little bit more of a pink cast to it rather than being as pale as it has been. 11/29/2021: The wounds are roughly the same size this week, perhaps a millimeter or 2 smaller. The medial wound has filled in and is nearly flush with the surrounding skin surface. She continues  to have a lot of slough accumulation on both surfaces. GENETA, MUDGE ([id number]) [id number] Page 12 of 18 12/06/2021: No significant change to her wounds, but she has a new opening on her dorsal foot, just distal to the right lateral ankle wound. The area on her left medial ankle that reopened looks a little bit larger today. She has quite a bit of pain associated with the new wound. 12/12/2021: Her wounds look about the same but the new opening on her right lateral dorsal foot is a little bit bigger. She continues to have a fair amount of pain with this wound. 12/26/2021: The left medial ankle wound is tiny and superficial. She has 2 areas of crusting on her left lateral ankle, however, that appear to be threatening to open again. Her right medial ankle wound is a little bit smaller today but still continues to accumulate thick rubbery slough. The new dorsal foot wound is exquisitely painful but there is no odor or purulent drainage. No erythema or induration. The right lateral ankle wound looks about the same today, again with thick rubbery slough. 01/03/2022: The left medial ankle wound has closed again. Both right ankle wounds appear to be about the same size with thick rubbery slough. The dorsal foot wound on the right  continues to be quite painful and she stated that she did not want any debridement of that site today. 01/10/2022: No real change to any of her wounds. She continues to accumulate thick slough. The dorsal foot wound has merged with the lateral malleolar wound. She is experiencing significant pain in the dorsal foot portion of the ulcer. 01/16/2022: Absolutely no change or progress in her wounds. 01/24/2022: Her wounds are unchanged. She continues to build up slough and the wounds on her dorsal right foot are still exquisitely tender. 01/31/2022: The wounds actually measure a little bit narrower today. They still have thick slough on the surface but the underlying tissue seems a little less fibrotic. We changed to Iodosorb last week. 02/07/2022: Wounds continue to slowly epithelialize around the parameters. She has less pain today. She still accumulates a fairly substantial layer of slough. 02/15/2022: No change to the wounds overall. The measured a little bit larger today per the intake nurse. She continues to have substantial slough accumulation and her pain is a little bit worse. 02/21/2022. No change at all to her wounds. I do not see that plastic surgery has received her referral yet. 02/28/2022: The wounds remain unchanged. They are dry and fibrotic with accumulation of slough and eschar. She did receive an appointment to see plastic surgery on October 23, but the office called her back and indicated they needed to reschedule and that the next available appointment was not until December. The patient became angry and decided she did not want to see this plastics group. 10/19; the patient sees Dr. Maylene Roes again next week. She is using Medihoney as the primary dressing changing this herself. 03/21/2022: She saw Dr. Ferd Hibbs at the wound care center at [facility]. Dr. Ferd Hibbs was also unable to debride her, secondary to pain and is planning to take her to the operating room for  operative debridement and potential skin substitute placement. Her wounds are unchanged with thick yellow slough and a fibrotic base. She says they are too painful to be debrided. In addition, yesterday her hemoglobin was 4 and she received 2 units packed red blood cell transfusion. 04/04/2022: Her operation at [facility] is scheduled for December 15. She  continues to use Medihoney on her wounds. They are a little bit less painful today and she is willing to entertain the possibility of debridement. The wounds measured a little bit larger in all dimensions today but the layer of slough is not as thick as usual. 04/18/2022: Her wounds actually measured a little bit smaller today and the extension onto the dorsal foot from the lateral wound has healed. They are less tender and there is significantly less slough present as compared to prior visits. 05/09/2022: She had to reschedule her surgery due to lack of childcare. It is now planned for January 5. The wounds are basically unchanged, but she had a lot more drainage, which was thick and somewhat purulent in appearance. No significant odor. 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 They are quite painful today and she does not want to have debridement performed. 05/30/2022: The patient underwent surgical debridement and placement of TheraSkin to her wounds on January 5. She has been doing well and does not endorse any pain. The TheraSkin is intact and looks beautiful. It is sutured in place. There is no exudate or significant drainage. 06/04/2022: Her TheraSkin remains intact and I am starting to see granulation tissue emerging underneath the surface. No concern for infection. 06/12/2022: The TheraSkin is intact and there is more granulation tissue emerging. It is starting to look a little bit dry, however. No malodor or purulent drainage. 06/19/2022: The TheraSkin continues to look good. The edges are a bit dry, but the central portion of  each of her wounds is showing a nice pink color. 06/27/2022: The TheraSkin on the lateral wound remains almost completely intact. There is nice pink tissue peaking through the mesh of the TheraSkin. On the medial leg, it is starting to breakdown a little bit, but most of the TheraSkin remains intact here, as well. Both wounds measured smaller today. 07/03/2022: The wounds are fairly dry around the perimeter today. Some of the suture is hanging loose. 07/12/2022: Both wounds are measuring smaller today. The medial ulcer is getting a bit too dry. The lateral ulcer continues to have nice buds of granulation tissue emerging. 07/26/2022: The moisture balance on both wounds is much better today. For the first time since I began seeing her, I see actual beefy red granulation tissue on the lateral ankle wound. There are more buds of deep pink granulation tissue emerging on the medial ankle. There is some dry eschar around the edges of the medial wound and a tiny amount of slough overlying the good granulation tissue on the lateral wound. 08/09/2022: Unfortunately, she has a new wound on her left lateral lower leg, just above the ankle. It is extremely painful for her. It penetrates to the fat layer and has the same woody, fibrotic character as her previous wounds. The other wounds are both a little bit smaller with slough and some eschar around the periphery. 08/14/2022: Her right lateral ankle wound is a little bit smaller. Unfortunately, she has had an increase in her drainage and it has a purulent nature to it, similar to what was present prior to her surgical debridement. She also reports odor coming from her wounds this week. The left lateral leg wound remains extremely tender. 08/21/2022: The left lateral ankle wound is larger and more painful today. Both right ankle wounds have a little bit more vital appearance, but they have accumulated more slough. She reports less odor this week. She is currently taking  Augmentin. 08/28/2022: The left lateral ankle  wound continues to enlarge and remains quite painful. Both of the right ankle wounds actually look better this week. There is improved tissue quality with less slough accumulation. 09/27/2022: The left lateral ankle wound actually looks nearly healed. She says that she has been applying some silver alginate that she had leftover. She was unable to afford the Santyl that was prescribed, but found a tube that she had on hand at home and has been using this on her right ankle wounds. These wounds look about the same and have accumulated the usual layer of slough. Unfortunately, a portion of the dorsal foot wound has reopened and is exquisitely painful today. LAMIRACLE, OWENS ([id number]) [id number] Page 13 of 18 10/09/2022: The left lateral ankle wound is healed. The dorsal foot wound appears to have expanded and remains exquisitely painful. The medial and lateral right ankle wounds look about the same size, but they are quite a bit cleaner. 10/23/2022: The left lateral ankle remains closed. The dorsal foot wound has expanded to the point that it now converges with the right lateral leg wound. She has accumulated fairly thick slough on all of the open surfaces. 11/06/2022: No significant change to any of her wounds, although the dorsal foot aspect of the right lateral wound is a little bit smaller. She has been approved for the [employer] and [employer] for [employer] and will be able to get a full year's supply. 11/20/2022: The dorsal foot aspect of the right lateral wound has almost completely closed. I am seeing nice pink tissue starting to emerge from the fibrotic surface of both wounds. The medial leg wound appears to be a little bit smaller today. She has been using Santyl. 12/04/2022: The dorsal foot aspect of the right lateral wound has closed. There is more pink granulation tissue emerging on her wounds. There are buds  of epithelium beginning to fill in from the edges of her wound, particularly at the proximal aspect of the medial wound and the distal aspect of the lateral wound. Significantly less slough formation than in the past. 12/18/2022: The wounds measured about the same size today, but there is better granulation tissue filling in and the overall surface consistency is improving. She does have slough accumulation on the surfaces, but no malodor or purulent drainage. 01/01/2023: No significant change to her wounds. The surface seems a little bit more fibrotic today. She is complaining of more pain. 01/15/2023: Her wounds are stable today. She has been taking Augmentin based on the culture that I took at her previous visit. She says that she has felt physically better since being on the antibiotic and has had less drainage from her wounds. 01/29/2023: The wounds are unchanged and remained fibrotic and painful. She does report having less pain while on antibiotics and less drainage, as well. Patient History Information obtained from Patient. Family History Diabetes - Mother, Lung Disease - Mother, No family history of Cancer, Heart Disease, Hereditary Spherocytosis, Hypertension, Kidney Disease, Seizures, Stroke, Thyroid Problems, Tuberculosis. Social History Never smoker, Marital Status - Married, Alcohol Use - Never, Drug Use - No History, Caffeine Use - Daily. Medical History Eyes Denies history of Cataracts, Glaucoma, Optic Neuritis Ear/Nose/Mouth/Throat Denies history of Chronic sinus problems/congestion, Middle ear problems Hematologic/Lymphatic Patient has history of Anemia, Sickle Cell Disease Denies history of Hemophilia, Human Immunodeficiency Virus, Lymphedema Respiratory Denies history of Aspiration, Asthma, Chronic Obstructive Pulmonary Disease (COPD), Pneumothorax, Sleep Apnea, Tuberculosis Cardiovascular Denies history of Angina, Arrhythmia, Congestive Heart Failure, Coronary Artery  Disease, Deep  Vein Thrombosis, Hypertension, Hypotension, Myocardial Infarction, Peripheral Arterial Disease, Peripheral Venous Disease, Phlebitis, Vasculitis Gastrointestinal Denies history of Cirrhosis , Colitis, Crohns, Hepatitis A, Hepatitis B, Hepatitis C Endocrine Denies history of Type I Diabetes, Type II Diabetes Genitourinary Denies history of End Stage Renal Disease Immunological Denies history of Lupus Erythematosus, Raynauds, Scleroderma Integumentary (Skin) Denies history of History of Burn Musculoskeletal Denies history of Gout, Rheumatoid Arthritis, Osteoarthritis, Osteomyelitis Neurologic Patient has history of Neuropathy - right foot intermittant Denies history of Dementia, Quadriplegia, Paraplegia, Seizure Disorder Oncologic Denies history of Received Chemotherapy, Received Radiation Psychiatric Denies history of Anorexia/bulimia, Confinement Anxiety Hospitalization/Surgery History - c section x2. - left breast lumpectomy. - iandD right ankle with theraskin. Medical A Surgical History Notes nd Constitutional Symptoms (General Health) H/O miscarriage Cardiovascular bradycardia Gastrointestinal [address] VERNADEAN, BINGENHEIMER ([id number]) [id number] Page 14 of 18 Constitutional no acute distress. Vitals Time Taken: 10:26 AM, Height: 67 in, Weight: 134 lbs, BMI: 21, Temperature: 98.2 F, Pulse: 83 bpm, Respiratory Rate: 18 breaths/min, Blood Pressure: 128/70 mmHg. Respiratory Normal work of breathing on room air. General Notes: 01/29/2023: The wounds are unchanged and remained fibrotic and painful. Integumentary (Hair, Skin) Wound #17 status is Open. Original cause of wound was Gradually Appeared. The date acquired was: 10/05/2012. The wound has been in treatment 93 weeks. The wound is located on the Right,Lateral Lower Leg. The wound measures 8.8cm length x 7.7cm width x 0.1cm depth; 53.219cm^2 area and 5.322cm^3 volume.  There is Fat Layer (Subcutaneous Tissue) exposed. There is no tunneling or undermining noted. There is a medium amount of serosanguineous drainage noted. The wound margin is distinct with the outline attached to the wound base. There is small (1-33%) pink granulation within the wound bed. There is a large (67-100%) amount of necrotic tissue within the wound bed including Adherent Slough. The periwound skin appearance had no abnormalities noted for moisture. The periwound skin appearance exhibited: Scarring, Hemosiderin Staining. The periwound skin appearance did not exhibit: Ecchymosis. Periwound temperature was noted as No Abnormality. The periwound has tenderness on palpation. Wound #21 status is Open. Original cause of wound was Gradually Appeared. The date acquired was: 06/26/2021. The wound has been in treatment 83 weeks. The wound is located on the Right,Medial Ankle. The wound measures 10.5cm length x 4cm width x 0.1cm depth; 32.987cm^2 area and 3.299cm^3 volume. There is Fat Layer (Subcutaneous Tissue) exposed. There is no tunneling or undermining noted. There is a medium amount of serosanguineous drainage noted. The wound margin is distinct with the outline attached to the wound base. There is small (1-33%) pink granulation within the wound bed. There is a large (67- 100%) amount of necrotic tissue within the wound bed including Adherent Slough. The periwound skin appearance had no abnormalities noted for moisture. The periwound skin appearance had no abnormalities noted for color. The periwound skin appearance exhibited: Scarring. Periwound temperature was noted as No Abnormality. The periwound has tenderness on palpation. Assessment Active Problems ICD-10 Non-pressure chronic ulcer of other part of right lower leg with other specified severity Sickle-cell disease without crisis Procedures Wound #17 Pre-procedure diagnosis of Wound #17 is a Sickle Cell Lesion located on the Right,Lateral  Lower Leg . There was a Selective/Open Wound Non-Viable Tissue Debridement with a total area of 26.6 sq cm performed by Duanne Guess, MD. With the following instrument(s): Curette to remove Non-Viable tissue/material. Material removed includes [facility] after achieving pain control using Lidocaine 5% topical ointment. No specimens were taken. A time out was conducted at 10:44, prior  to the start of the procedure. A Minimum amount of bleeding was controlled with Pressure. The procedure was tolerated well. Post Debridement Measurements: 8.8cm length x 7.7cm width x 0.1cm depth; 5.322cm^3 volume. Character of Wound/Ulcer Post Debridement is improved. Post procedure Diagnosis Wound #17: Same as Pre-Procedure Wound #21 Pre-procedure diagnosis of Wound #21 is a Sickle Cell Lesion located on the Right,Medial Ankle .Severity of Tissue Pre Debridement is: Fat layer exposed. There was a Selective/Open Wound Non-Viable Tissue Debridement with a total area of 24.73 sq cm performed by Duanne Guess, MD. With the following instrument(s): Curette to remove Non-Viable tissue/material. Material removed includes [facility] after achieving pain control using Lidocaine 5% topical ointment. No specimens were taken. A time out was conducted at 10:44, prior to the start of the procedure. A Minimum amount of bleeding was controlled with Pressure. The procedure was tolerated well. Post Debridement Measurements: 10.5cm length x 4cm width x 0.1cm depth; 3.299cm^3 volume. Character of Wound/Ulcer Post Debridement is improved. Severity of Tissue Post Debridement is: Fat layer exposed. Post procedure Diagnosis Wound #21: Same as Pre-Procedure Plan Follow-up Appointments: Return Appointment in 2 weeks. - Dr. Lady Gary Rm 2 Anesthetic: (In clinic) Topical Lidocaine 5% applied to wound bed Bathing/ Shower/ Hygiene: May shower and wash wound with soap and water. Edema Control - Lymphedema / SCD / Other: Elevate legs to the level  of the heart or above for 30 minutes daily and/or when sitting for 3-4 times a day throughout the day. Avoid standing for long periods of time. Exercise regularly Additional Orders / Instructions: Follow Nutritious Diet Juven Shake 1-2 times daily. BRANIGAN, HANELINE ([id number]) [id number] Page 15 of 18 The following medication(s) was prescribed: lidocaine topical 5 % ointment ointment topical was prescribed at facility WOUND #17: - Lower Leg Wound Laterality: Right, Lateral Cleanser: Soap and Water 1 x Per Week/30 Days Discharge Instructions: May shower and wash wound with dial antibacterial soap and water prior to dressing change. Cleanser: Wound Cleanser 1 x Per Week/30 Days Discharge Instructions: Cleanse the wound with wound cleanser prior to applying a clean dressing using gauze sponges, not tissue or cotton balls. Peri-Wound Care: Triamcinolone 15 (g) 1 x Per Week/30 Days Discharge Instructions: Use triamcinolone 15 (g) as directed Peri-Wound Care: Sween Lotion (Moisturizing lotion) 1 x Per Week/30 Days Discharge Instructions: Apply moisturizing lotion as directed Prim Dressing: Santyl Ointment 1 x Per Week/30 Days ary Discharge Instructions: Apply nickel thick amount to wound bed as instructed Secondary Dressing: ABD Pad, 5x9 1 x Per Week/30 Days Discharge Instructions: Apply over primary dressing as directed. Secondary Dressing: Woven Gauze Sponge, Non-Sterile 4x4 in 1 x Per Week/30 Days Discharge Instructions: Moisten with normal saline and Apply over primary dressing as directed. Secured With: Elastic Bandage 4 inch (ACE bandage) 1 x Per Week/30 Days Discharge Instructions: Secure with ACE bandage as directed. Secured With: [employer], 4.5x3.1 (in/yd) 1 x Per Week/30 Days Discharge Instructions: Secure with Kerlix as directed. WOUND #21: - Ankle Wound Laterality: Right, Medial Cleanser: Soap and Water 1 x Per Week/30 Days Discharge  Instructions: May shower and wash wound with dial antibacterial soap and water prior to dressing change. Cleanser: Wound Cleanser 1 x Per Week/30 Days Discharge Instructions: Cleanse the wound with wound cleanser prior to applying a clean dressing using gauze sponges, not tissue or cotton balls. Peri-Wound Care: Triamcinolone 15 (g) 1 x Per Week/30 Days Discharge Instructions: Use triamcinolone 15 (g) as directed Peri-Wound Care: Sween Lotion (Moisturizing lotion) 1 x Per Week/30 Days  Discharge Instructions: Apply moisturizing lotion as directed Prim Dressing: Santyl Ointment 1 x Per Week/30 Days ary Discharge Instructions: Apply nickel thick amount to wound bed as instructed Secondary Dressing: ABD Pad, 5x9 1 x Per Week/30 Days Discharge Instructions: Apply over primary dressing as directed. Secondary Dressing: Woven Gauze Sponge, Non-Sterile 4x4 in 1 x Per Week/30 Days Discharge Instructions: Moisten with normal saline and Apply over primary dressing as directed. Secured With: Elastic Bandage 4 inch (ACE bandage) 1 x Per Week/30 Days Discharge Instructions: Secure with ACE bandage as directed. Secured With: [employer], 4.5x3.1 (in/yd) 1 x Per Week/30 Days Discharge Instructions: Secure with Kerlix as directed. 01/29/2023: The wounds are unchanged and remained fibrotic and painful. She does report having less pain while on antibiotics and less drainage, as well. I used a curette to debride slough and biofilm off of the wounds to the extent that the patient would permit, secondary to pain. At this point I feel like we have exhausted all of the various modalities that I have at my disposal and I am going to contact [facility] for assistance, to see if there are any other options that they might be able to offer. For now, we will continue Santyl with saline moistened gauze packing, Kerlix and Ace compression. She will continue Augmentin to equal a total of 30 days of treatment. Follow-up  in 2 weeks. Electronic Signature(s) Signed: 01/29/2023 11:14:49 AM By: Duanne Guess MD FACS Entered By: Duanne Guess on 01/29/2023 11:14:49 -------------------------------------------------------------------------------- HxROS Details Patient Name: Date of Service: [name] [name], [name] 01/29/2023 10:45 A M Medical Record Number: [id number] Patient Account Number: [id number] Date of Birth/Sex: Treating RN: 06-08-1970 (52 y.o. F) Primary Care Provider: Julianne Handler Other Clinician: Referring Provider: Treating Provider/Extender: Koleen Nimrod Weeks in Treatment: 94 Information Obtained From Patient Constitutional Symptoms (General Health) Medical History: Past Medical History Notes: H/O miscarriage ALITA, IIAMS ([id number]) [id number] Page 16 of 18 Eyes Medical History: Negative for: Cataracts; Glaucoma; Optic Neuritis Ear/Nose/Mouth/Throat Medical History: Negative for: Chronic sinus problems/congestion; Middle ear problems Hematologic/Lymphatic Medical History: Positive for: Anemia; Sickle Cell Disease Negative for: Hemophilia; Human Immunodeficiency Virus; Lymphedema Respiratory Medical History: Negative for: Aspiration; Asthma; Chronic Obstructive Pulmonary Disease (COPD); Pneumothorax; Sleep Apnea; Tuberculosis Cardiovascular Medical History: Negative for: Angina; Arrhythmia; Congestive Heart Failure; Coronary Artery Disease; Deep Vein Thrombosis; Hypertension; Hypotension; Myocardial Infarction; Peripheral Arterial Disease; Peripheral Venous Disease; Phlebitis; Vasculitis Past Medical History Notes: bradycardia Gastrointestinal Medical History: Negative for: Cirrhosis ; Colitis; Crohns; Hepatitis A; Hepatitis B; Hepatitis C Past Medical History Notes: cholilithiasis Endocrine Medical History: Negative for: Type I Diabetes; Type II Diabetes Genitourinary Medical History: Negative for: End Stage Renal  Disease Immunological Medical History: Negative for: Lupus Erythematosus; Raynauds; Scleroderma Integumentary (Skin) Medical History: Negative for: History of Burn Musculoskeletal Medical History: Negative for: Gout; Rheumatoid Arthritis; Osteoarthritis; Osteomyelitis Neurologic Medical History: Positive for: Neuropathy - right foot intermittant Negative for: Dementia; Quadriplegia; Paraplegia; Seizure Disorder Oncologic Medical History: Negative for: Received Chemotherapy; Received Radiation Psychiatric Medical History: Negative for: Anorexia/bulimia; Confinement Anxiety Immunizations Pneumococcal Vaccine: [name] ([id number]) [id number] Page 17 of 18 Received Pneumococcal Vaccination: No Implantable Devices None Hospitalization / Surgery History Type of Hospitalization/Surgery c section x2 left breast lumpectomy iandD right ankle with theraskin Family and Social History Cancer: No; Diabetes: Yes - Mother; Heart Disease: No; Hereditary Spherocytosis: No; Hypertension: No; Kidney Disease: No; Lung Disease: Yes - Mother; Seizures: No; Stroke: No; Thyroid Problems: No; Tuberculosis: No; Never smoker; Marital Status - Married; Alcohol Use: Never;  Drug Use: No History; Caffeine Use: Daily; Financial Concerns: No; Food, Clothing or Shelter Needs: No; Support System Lacking: No; Transportation Concerns: No Electronic Signature(s) Signed: 01/29/2023 1:02:16 PM By: Duanne Guess MD FACS Entered By: Duanne Guess on 01/29/2023 11:09:13 -------------------------------------------------------------------------------- SuperBill Details Patient Name: Date of Service: [name] [name], [name] 01/29/2023 Medical Record Number: [id number] Patient Account Number: [id number] Date of Birth/Sex: Treating RN: 1971-01-24 (52 y.o. F) Primary Care Provider: Julianne Handler Other Clinician: Referring Provider: Treating Provider/Extender: Koleen Nimrod Weeks in Treatment: 25 Diagnosis Coding ICD-10 Codes Code Description L97.818 Non-pressure chronic ulcer of other part of right lower leg with other specified severity 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 Facility Procedures : CPT4 Code: [id number] Description: [phone] - DEBRIDE WOUND 1ST 20 SQ CM OR < ICD-10 Diagnosis Description L97.818 Non-pressure chronic ulcer of other part of right lower leg with other specified s Modifier: everity Quantity: 1 : CPT4 Code: [id number] Description: [id number] - DEBRIDE WOUND EA ADDL 20 SQ CM ICD-10 Diagnosis Description L97.818 Non-pressure chronic ulcer of other part of right lower leg with other specified s Modifier: everity Quantity: 2 Physician Procedures : CPT4 Code Description Modifier [id number] - WC PHYS LEVEL 4 - EST PT 25 ICD-10 Diagnosis Description L97.818 Non-pressure chronic ulcer of other part of right lower leg with other specified severity 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 Quantity: 1 : [id number] - WC PHYS DEBR WO ANESTH 20 SQ CM ICD-10 Diagnosis Description L97.818 Non-pressure chronic ulcer of other part of right lower leg with other specified severity Quantity: 1 : [id number] - WC PHYS DEBR WO ANESTH EA ADD 20 CM ICD-10 Diagnosis Description L97.818 Non-pressure chronic ulcer of other part of right lower leg with other specified severity KINSIE, FOUGHT ([id number]) [id number] P Quantity: 2 age 54 of 42 Electronic Signature(s) Signed: 01/29/2023 11:15:12 AM By: Duanne Guess MD FACS Entered By: Duanne Guess on 01/29/2023 11:15:11

## 2023-01-29 NOTE — Progress Notes (Signed)
EVYENIA, GUZY ([id number]) [id number] Page 1 of 9 Visit Report for 01/29/2023 Arrival Information Details Patient Name: Date of Service: [name] [name] 01/29/2023 10:45 A M Medical Record Number: [id number] Patient Account Number: [id number] Date of Birth/Sex: Treating RN: 12/09/1970 (52 y.o. F) Primary Care Hicks Feick: Julianne Handler Other Clinician: Referring Bill Yohn: Treating Xzayvion Vaeth/Extender: Koleen Nimrod Weeks in Treatment: 12 Visit Information History Since Last Visit All ordered tests and consults were completed: No Patient Arrived: [name] Added or deleted any medications: No Arrival Time: 10:26 Any new allergies or adverse reactions: No Accompanied By: self Had a fall or experienced change in No Transfer Assistance: None activities of daily living that may affect Patient Identification Verified: Yes risk of falls: Secondary Verification Process Completed: Yes Signs or symptoms of abuse/neglect since last visito No Patient Requires Transmission-Based Precautions: No Hospitalized since last visit: No Patient Has Alerts: No Implantable device outside of the clinic excluding No cellular tissue based products placed in the center since last visit: Pain Present Now: Yes Electronic Signature(s) Signed: 01/29/2023 10:43:40 AM By: Dayton Scrape Entered By: Dayton Scrape on 01/29/2023 10:26:48 -------------------------------------------------------------------------------- Encounter Discharge Information Details Patient Name: Date of Service: [name], [name] 01/29/2023 10:45 A M Medical Record Number: [id number] Patient Account Number: [id number] Date of Birth/Sex: Treating RN: 1970/09/01 (52 y.o. [name] Primary Care Dennys Guin: Julianne Handler Other Clinician: Referring Lakea Mittelman: Treating Rudean Icenhour/Extender: Koleen Nimrod Weeks in Treatment: 37 Encounter Discharge Information Items Post Procedure  Vitals Discharge Condition: Stable Temperature (F): 98.2 Ambulatory Status: Cane Pulse (bpm): 83 Discharge Destination: Home Respiratory Rate (breaths/min): 18 Transportation: Private Auto Blood Pressure (mmHg): 128/70 Accompanied By: self Schedule Follow-up Appointment: Yes Clinical Summary of Care: Patient Declined Electronic Signature(s) Signed: 01/29/2023 4:54:20 PM By: Samuella Bruin Entered By: Samuella Bruin on 01/29/2023 11:06:13 [name] ([id number]) [id number] Page 2 of 9 -------------------------------------------------------------------------------- Lower Extremity Assessment Details Patient Name: Date of Service: [name] [name] 01/29/2023 10:45 A M Medical Record Number: [id number] Patient Account Number: [id number] Date of Birth/Sex: Treating RN: 1971-05-14 (52 y.o. [name] Primary Care Leiya Keesey: Julianne Handler Other Clinician: Referring Marcina Kinnison: Treating Yamilet Mcfayden/Extender: Koleen Nimrod Weeks in Treatment: 93 Edema Assessment Assessed: [Left: No] [Right: No] Edema: [Left: N] [Right: o] Calf Left: Right: Point of Measurement: 33 cm From Medial Instep 33.3 cm Ankle Left: Right: Point of Measurement: 10 cm From Medial Instep 21.2 cm Vascular Assessment Pulses: Dorsalis Pedis Palpable: [Right:Yes] Electronic Signature(s) Signed: 01/29/2023 4:54:20 PM By: Samuella Bruin Entered By: Samuella Bruin on 01/29/2023 10:32:49 -------------------------------------------------------------------------------- Multi Wound Chart Details Patient Name: Date of Service: [name], [name] 01/29/2023 10:45 A M Medical Record Number: [id number] Patient Account Number: [id number] Date of Birth/Sex: Treating RN: [birth year] (52 y.o. F) Primary Care Kaniah Rizzolo: Julianne Handler Other Clinician: Referring Chantille Navarrete: Treating Jamilee Lafosse/Extender: Koleen Nimrod Weeks in Treatment:  82 Vital Signs Height(in): 67 Pulse(bpm): 83 Weight(lbs): 134 Blood Pressure(mmHg): 128/70 Body Mass Index(BMI): 21 Temperature(F): 98.2 Respiratory Rate(breaths/min): 18 [17:Photos:] [N/A:N/A [id number] Page 3 of 9] Right, Lateral Lower Leg Right, Medial Ankle N/A Wound Location: Gradually Appeared Gradually Appeared N/A Wounding Event: Sickle Cell Lesion Sickle Cell Lesion N/A Primary Etiology: N/A Venous Leg Ulcer N/A Secondary Etiology: Anemia, Sickle Cell Disease, Anemia, Sickle Cell Disease, N/A Comorbid History: Neuropathy Neuropathy 10/05/2012 06/26/2021 N/A Date Acquired: 20 83 N/A Weeks of Treatment: Open Open N/A Wound Status: No No N/A Wound Recurrence: Yes No N/A Clustered Wound: 8.8x7.7x0.1 10.5x4x0.1 N/A Measurements L x  W x D (cm) 53.219 32.987 N/A A (cm) : rea 5.322 3.299 N/A Volume (cm) : 71.70% -13.80% N/A % Reduction in A rea: 71.70% -13.80% N/A % Reduction in Volume: Full Thickness Without Exposed Full Thickness Without Exposed N/A Classification: Support Structures Support Structures Medium Medium N/A Exudate A mount: Serosanguineous Serosanguineous N/A Exudate Type: red, brown red, brown N/A Exudate Color: Distinct, outline attached Distinct, outline attached N/A Wound Margin: Small (1-33%) Small (1-33%) N/A Granulation A mount: Pink Pink N/A Granulation Quality: Large (67-100%) Large (67-100%) N/A Necrotic A mount: Fat Layer (Subcutaneous Tissue): Yes Fat Layer (Subcutaneous Tissue): Yes N/A Exposed Structures: Fascia: No Fascia: No Tendon: No Tendon: No Muscle: No Muscle: No Joint: No Joint: No Bone: No Bone: No Small (1-33%) Small (1-33%) N/A Epithelialization: Debridement - Selective/Open Wound Debridement - Selective/Open Wound N/A Debridement: Pre-procedure Verification/Time Out 10:44 10:44 N/A Taken: Lidocaine 5% topical ointment Lidocaine 5% topical ointment N/A Pain Control: [employer] N/A Tissue Debrided: Non-Viable Tissue Non-Viable Tissue N/A Level: 26.6 24.73 N/A Debridement A (sq cm): rea Curette Curette N/A Instrument: Minimum Minimum N/A Bleeding: Pressure Pressure N/A Hemostasis A chieved: Procedure was tolerated well Procedure was tolerated well N/A Debridement Treatment Response: 8.8x7.7x0.1 10.5x4x0.1 N/A Post Debridement Measurements L x W x D (cm) 5.322 3.299 N/A Post Debridement Volume: (cm) Scarring: Yes Scarring: Yes N/A Periwound Skin Texture: No Abnormalities Noted No Abnormalities Noted N/A Periwound Skin Moisture: Hemosiderin Staining: Yes No Abnormalities Noted N/A Periwound Skin Color: Ecchymosis: No No Abnormality No Abnormality N/A Temperature: Yes Yes N/A Tenderness on Palpation: Debridement Debridement N/A Procedures Performed: Treatment Notes Electronic Signature(s) Signed: 01/29/2023 11:02:41 AM By: Duanne Guess MD FACS Entered By: Duanne Guess on 01/29/2023 11:02:41 -------------------------------------------------------------------------------- Multi-Disciplinary Care Plan Details Patient Name: Date of Service: [name], [name] 01/29/2023 10:45 A M Medical Record Number: [id number] Patient Account Number: [id number] Date of Birth/Sex: Treating RN: 01-18-1971 (52 y.o. [name] Primary Care Leomia Blake: Julianne Handler Other Clinician: Referring Judy Goodenow: Treating Lorann Tani/Extender: Koleen Nimrod Weeks in Treatment: 29 Multidisciplinary Care Plan reviewed with physician [name], [name] ([id number]) [id number] Page 4 of 9 Active Inactive Necrotic Tissue Nursing Diagnoses: Impaired tissue integrity related to necrotic/devitalized tissue Knowledge deficit related to management of necrotic/devitalized tissue Goals: Necrotic/devitalized tissue will be minimized in the wound bed Date Initiated: 12/04/2022 Target Resolution Date: 03/21/2023 Goal  Status: Active Patient/caregiver will verbalize understanding of reason and process for debridement of necrotic tissue Date Initiated: 12/04/2022 Target Resolution Date: 03/21/2023 Goal Status: Active Interventions: Assess patient pain level pre-, during and post procedure and prior to discharge Provide education on necrotic tissue and debridement process Treatment Activities: Apply topical anesthetic as ordered : 12/04/2022 Enzymatic debridement : 12/04/2022 Notes: Electronic Signature(s) Signed: 01/29/2023 4:54:20 PM By: Samuella Bruin Entered By: Samuella Bruin on 01/29/2023 10:40:24 -------------------------------------------------------------------------------- Pain Assessment Details Patient Name: Date of Service: [name], [name] 01/29/2023 10:45 A M Medical Record Number: [id number] Patient Account Number: [id number] Date of Birth/Sex: Treating RN: 11/22/1970 (52 y.o. F) Primary Care Shaneisha Burkel: Julianne Handler Other Clinician: Referring Aeson Sawyers: Treating Alyse Kathan/Extender: Koleen Nimrod Weeks in Treatment: 41 Active Problems Location of Pain Severity and Description of Pain Patient Has Paino Yes Site Locations Rate the pain. Current Pain Level: 4 Worst Pain Level: 10 Least Pain Level: 0 Tolerable Pain Level: 1 Pain Management and Medication Current Pain Management: TIMMYA, DOSER ([id number]) [id number] Page 5 of 9 Electronic Signature(s) Signed: 01/29/2023 10:43:40 AM By: Dayton Scrape Entered By: Dayton Scrape on 01/29/2023  10:27:33 -------------------------------------------------------------------------------- Patient/Caregiver Education Details Patient Name: Date of Service: [name], [place] [name] 9/11/2024andnbsp10:45 A M Medical Record Number: [id number] Patient Account Number: [id number] Date of Birth/Gender: Treating RN: Sep 13, 1970 (52 y.o. [name] Primary Care Physician: Julianne Handler Other  Clinician: Referring Physician: Treating Physician/Extender: Koleen Nimrod Weeks in Treatment: 74 Education Assessment Education Provided To: Patient Education Topics Provided Wound/Skin Impairment: Methods: Explain/Verbal Responses: Reinforcements needed, State content correctly Electronic Signature(s) Signed: 01/29/2023 4:54:20 PM By: Samuella Bruin Entered By: Samuella Bruin on 01/29/2023 10:42:19 -------------------------------------------------------------------------------- Wound Assessment Details Patient Name: Date of Service: [name], [name] 01/29/2023 10:45 A M Medical Record Number: [id number] Patient Account Number: [id number] Date of Birth/Sex: Treating RN: 01-26-1971 (52 y.o. [name] Primary Care Amarii Amy: Julianne Handler Other Clinician: Referring Christabel Camire: Treating Caniya Tagle/Extender: Koleen Nimrod Weeks in Treatment: 35 Wound Status Wound Number: 17 Primary Etiology: Sickle Cell Lesion Wound Location: Right, Lateral Lower Leg Wound Status: Open Wounding Event: Gradually Appeared Comorbid History: Anemia, Sickle Cell Disease, Neuropathy Date Acquired: 10/05/2012 Weeks Of Treatment: 93 Clustered Wound: Yes Photos [name] ([id number]) [phone].pdf Page 6 of 9 Wound Measurements Length: (cm) 8.8 Width: (cm) 7.7 Depth: (cm) 0.1 Area: (cm) 53.219 Volume: (cm) 5.322 % Reduction in Area: 71.7% % Reduction in Volume: 71.7% Epithelialization: Small (1-33%) Tunneling: No Undermining: No Wound Description Classification: Full Thickness Without Exposed Support Structures Wound Margin: Distinct, outline attached Exudate Amount: Medium Exudate Type: Serosanguineous Exudate Color: red, brown Foul Odor After Cleansing: No Slough/Fibrino Yes Wound Bed Granulation Amount: Small (1-33%) Exposed Structure Granulation Quality: Pink Fascia Exposed: No Necrotic Amount: Large  (67-100%) Fat Layer (Subcutaneous Tissue) Exposed: Yes Necrotic Quality: Adherent Slough Tendon Exposed: No Muscle Exposed: No Joint Exposed: No Bone Exposed: No Periwound Skin Texture Texture Color No Abnormalities Noted: No No Abnormalities Noted: No Scarring: Yes Ecchymosis: No Hemosiderin Staining: Yes Moisture No Abnormalities Noted: Yes Temperature / Pain Temperature: No Abnormality Tenderness on Palpation: Yes Treatment Notes Wound #17 (Lower Leg) Wound Laterality: Right, Lateral Cleanser Soap and Water Discharge Instruction: May shower and wash wound with dial antibacterial soap and water prior to dressing change. Wound Cleanser Discharge Instruction: Cleanse the wound with wound cleanser prior to applying a clean dressing using gauze sponges, not tissue or cotton balls. Peri-Wound Care Triamcinolone 15 (g) Discharge Instruction: Use triamcinolone 15 (g) as directed Sween Lotion (Moisturizing lotion) Discharge Instruction: Apply moisturizing lotion as directed Topical Primary Dressing Santyl Ointment Discharge Instruction: Apply nickel thick amount to wound bed as instructed Secondary Dressing ABD Pad, 5x9 Discharge Instruction: Apply over primary dressing as directed. Woven Gauze Sponge, Non-Sterile 4x4 in Discharge Instruction: Moisten with normal saline and Apply over primary dressing as directed. [name], [name] ([id number]) [id number] Page 7 of 9 Secured With Elastic Bandage 4 inch (ACE bandage) Discharge Instruction: Secure with ACE bandage as directed. Kerlix Roll Sterile, 4.5x3.1 (in/yd) Discharge Instruction: Secure with Kerlix as directed. Compression Wrap Compression Stockings Add-Ons Electronic Signature(s) Signed: 01/29/2023 4:54:20 PM By: Samuella Bruin Entered By: Samuella Bruin on 01/29/2023 10:35:52 -------------------------------------------------------------------------------- Wound Assessment  Details Patient Name: Date of Service: [name], [place] [name] 01/29/2023 10:45 A M Medical Record Number: [id number] Patient Account Number: [id number] Date of Birth/Sex: Treating RN: [birth year] (52 y.o. [name] Primary Care Emilija Bohman: Julianne Handler Other Clinician: Referring Radek Carnero: Treating Dontai Pember/Extender: Koleen Nimrod Weeks in Treatment: 75 Wound Status Wound Number: 21 Primary Etiology: Sickle Cell Lesion Wound Location: Right, Medial Ankle Secondary Etiology: Venous Leg Ulcer Wounding  Event: Gradually Appeared Wound Status: Open Date Acquired: 06/26/2021 Comorbid History: Anemia, Sickle Cell Disease, Neuropathy Weeks Of Treatment: 83 Clustered Wound: No Photos Wound Measurements Length: (cm) 10.5 Width: (cm) 4 Depth: (cm) 0.1 Area: (cm) 32.987 Volume: (cm) 3.299 % Reduction in Area: -13.8% % Reduction in Volume: -13.8% Epithelialization: Small (1-33%) Tunneling: No Undermining: No Wound Description Classification: Full Thickness Without Exposed Support Structures Wound Margin: Distinct, outline attached Exudate Amount: Medium Exudate Type: Serosanguineous Exudate Color: red, brown Foul Odor After Cleansing: No Slough/Fibrino Yes Wound Bed Granulation Amount: Small (1-33%) Exposed Structure Granulation Quality: Pink Fascia Exposed: No Necrotic Amount: Large (67-100%) Fat Layer (Subcutaneous Tissue) Exposed: Yes Necrotic Quality: Adherent Slough Tendon Exposed: No [name] ([id number]) [id number] Page 8 of 9 Muscle Exposed: No Joint Exposed: No Bone Exposed: No Periwound Skin Texture Texture Color No Abnormalities Noted: No No Abnormalities Noted: Yes Scarring: Yes Temperature / Pain Temperature: No Abnormality Moisture No Abnormalities Noted: Yes Tenderness on Palpation: Yes Treatment Notes Wound #21 (Ankle) Wound Laterality: Right, Medial Cleanser Soap and Water Discharge  Instruction: May shower and wash wound with dial antibacterial soap and water prior to dressing change. Wound Cleanser Discharge Instruction: Cleanse the wound with wound cleanser prior to applying a clean dressing using gauze sponges, not tissue or cotton balls. Peri-Wound Care Triamcinolone 15 (g) Discharge Instruction: Use triamcinolone 15 (g) as directed Sween Lotion (Moisturizing lotion) Discharge Instruction: Apply moisturizing lotion as directed Topical Primary Dressing Santyl Ointment Discharge Instruction: Apply nickel thick amount to wound bed as instructed Secondary Dressing ABD Pad, 5x9 Discharge Instruction: Apply over primary dressing as directed. Woven Gauze Sponge, Non-Sterile 4x4 in Discharge Instruction: Moisten with normal saline and Apply over primary dressing as directed. Secured With Elastic Bandage 4 inch (ACE bandage) Discharge Instruction: Secure with ACE bandage as directed. Kerlix Roll Sterile, 4.5x3.1 (in/yd) Discharge Instruction: Secure with Kerlix as directed. Compression Wrap Compression Stockings Add-Ons Electronic Signature(s) Signed: 01/29/2023 4:54:20 PM By: Samuella Bruin Entered By: Samuella Bruin on 01/29/2023 10:36:27 -------------------------------------------------------------------------------- Vitals Details Patient Name: Date of Service: [name], [name] 01/29/2023 10:45 A M Medical Record Number: [id number] Patient Account Number: [id number] Date of Birth/Sex: Treating RN: 04-25-1971 (52 y.o. F) Primary Care Denzell Colasanti: Julianne Handler Other Clinician: Referring Jakorian Marengo: Treating [name]/Extender: Koleen Nimrod Weeks in Treatment: [address] [place], Felecia Shelling ([id number]) [id number] Page 9 of 9 Time Taken: 10:26 Temperature (F): 98.2 Height (in): 67 Pulse (bpm): 83 Weight (lbs): 134 Respiratory Rate (breaths/min): 18 Body Mass Index (BMI): 21 Blood Pressure (mmHg):  128/70 Reference Range: 80 - 120 mg / dl Electronic Signature(s) Signed: 01/29/2023 10:43:40 AM By: Dayton Scrape Entered By: Dayton Scrape on 01/29/2023 10:27:12

## 2023-02-07 NOTE — Progress Notes (Signed)
Please inform patient that cologuard is negative. Recommend repeating in 3 years. Please ensure that patient has a 3 months f/u appointment scheduled for her sickle cell disease medication management.   Nolon Nations  APRN, MSN, FNP-C Patient Care [facility] [address] [place], [place] [address] [phone]

## 2023-02-10 ENCOUNTER — Encounter (HOSPITAL_BASED_OUTPATIENT_CLINIC_OR_DEPARTMENT_OTHER): Payer: Medicaid Other | Admitting: General Surgery

## 2023-02-10 DIAGNOSIS — L97818 Non-pressure chronic ulcer of other part of right lower leg with other specified severity: Secondary | ICD-10-CM | POA: Diagnosis not present

## 2023-02-10 NOTE — Progress Notes (Signed)
Discharge Instructions: Apply nickel thick amount to wound bed as instructed Secondary Dressing: ABD Pad, 5x9 1 x Per Week/30 Days Discharge Instructions: Apply over primary dressing as directed. Secondary Dressing: Woven Gauze Sponge, Non-Sterile 4x4 in 1 x Per Week/30 Days Discharge Instructions: Moisten with normal saline and Apply over primary dressing as directed. Secured With: Elastic Bandage 4 inch (ACE bandage) 1 x Per Week/30 Days Discharge Instructions: Secure with ACE bandage as directed. Secured With: [employer], 4.5x3.1 (in/yd) 1 x Per Week/30 Days Discharge Instructions: Secure with Kerlix as directed. WOUND #21: - Ankle Wound Laterality: Right, Medial Cleanser: Soap and Water 1 x Per Week/30 Days Discharge Instructions: May shower and wash wound with dial antibacterial soap and water prior to dressing change. Cleanser: Wound Cleanser 1 x Per Week/30 Days Discharge Instructions: Cleanse the wound with wound cleanser prior to applying a clean dressing using gauze sponges, not tissue or cotton balls. Peri-Wound Care: Triamcinolone 15 (g) 1 x Per Week/30 Days Discharge Instructions: Use triamcinolone 15 (g) as directed Peri-Wound Care: Sween Lotion (Moisturizing lotion) 1 x Per Week/30 Days Discharge Instructions: Apply moisturizing lotion as directed Prim Dressing: Santyl Ointment 1 x Per Week/30 Days ary Discharge Instructions: Apply nickel thick amount to wound bed as  instructed Secondary Dressing: ABD Pad, 5x9 1 x Per Week/30 Days Discharge Instructions: Apply over primary dressing as directed. Secondary Dressing: Woven Gauze Sponge, Non-Sterile 4x4 in 1 x Per Week/30 Days Discharge Instructions: Moisten with normal saline and Apply over primary dressing as directed. Secured With: Elastic Bandage 4 inch (ACE bandage) 1 x Per Week/30 Days Discharge Instructions: Secure with ACE bandage as directed. Secured With: [employer], 4.5x3.1 (in/yd) 1 x Per Week/30 Days Discharge Instructions: Secure with Kerlix as directed. 02/07/2023: Her wounds have gotten larger and more painful. She completed her course of antibiotics yesterday. She still has a fair amount of drainage coming from her wounds. I was only able to debride about 50% of each of her wounds, removing slough with a curette. She was unable to tolerate further debridement secondary to pain. I did take another culture due to the fact that her wounds got larger while on Augmentin (which was prescribed based on prior culture data). For now, we will continue Santyl with Kerlix and Ace bandaging. Follow-up in 2 [name]. Electronic Signature(s) Signed: 02/12/2023 10:24:27 AM By: [name] Guess MD FACS Signed: 02/12/2023 4:28:05 PM By: [name] Previous Signature: 02/10/2023 10:16:47 AM Version By: [name] Guess MD FACS Entered By: [name] on 02/12/2023 09:48:59 [name] ([id number]) [id number] Page 16 of 18 -------------------------------------------------------------------------------- HxROS Details Patient Name: Date of Service: [name] 02/10/2023 10:00 A M Medical Record Number: [id number] Patient Account Number: [id number] Date of Birth/Sex: Treating RN: 1971-02-22 (52 y.o. F) Primary Care Provider: Julianne [name] Other Clinician: Referring Provider: Treating Provider/Extender: [name] in  Treatment: 47 Information Obtained From Patient Constitutional Symptoms (General Health) Medical History: Past Medical History Notes: H/O miscarriage Eyes Medical History: Negative for: Cataracts; Glaucoma; Optic Neuritis Ear/Nose/Mouth/Throat Medical History: Negative for: Chronic sinus problems/congestion; Middle ear problems Hematologic/Lymphatic Medical History: Positive for: Anemia; Sickle Cell Disease Negative for: Hemophilia; Human Immunodeficiency Virus; Lymphedema Respiratory Medical History: Negative for: Aspiration; Asthma; Chronic Obstructive Pulmonary Disease (COPD); Pneumothorax; Sleep Apnea; Tuberculosis Cardiovascular Medical History: Negative for: Angina; Arrhythmia; Congestive Heart Failure; Coronary Artery Disease; Deep Vein Thrombosis; Hypertension; Hypotension; Myocardial Infarction; Peripheral Arterial Disease; Peripheral Venous Disease; Phlebitis; Vasculitis Past Medical History Notes: bradycardia Gastrointestinal Medical History: Negative  [name], [name] ([id number]) [id number] Page 1 of 18 Visit Report for 02/10/2023 Chief Complaint Document Details Patient Name: Date of Service: [name] 02/10/2023 10:00 A M Medical Record Number: [id number] Patient Account Number: [id number] Date of Birth/Sex: Treating RN: 08/08/1970 (52 y.o. 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 [name] Primary Care Provider: Julianne [name] Other Clinician: Referring Provider: Treating Provider/Extender: [name] in Treatment: 94 Debridement Performed for Assessment: Wound #21 Right,Medial Ankle Performed By: Physician [name] Guess, MD The following information was scribed by: [name] The information was scribed for: [name] Debridement Type: Debridement Severity of Tissue Pre Debridement: Fat layer exposed Level of Consciousness (Pre-procedure): Awake and Alert Pre-procedure Verification/Time Out Yes - 10:06 Taken: Start Time: 10:06 Pain Control: Lidocaine 5% topical ointment Percent of Wound Bed Debrided: 50% T Area Debrided (cm): otal 20.31 Tissue and other material  debrided: Non-Viable, Slough, Slough Level: Non-Viable Tissue Debridement Description: Selective/Open Wound Instrument: Curette Bleeding: Minimum Hemostasis Achieved: Pressure Response to Treatment: Procedure was tolerated well Level of Consciousness (Post- Awake and Alert procedure): Post Debridement Measurements of Total Wound Length: (cm) 11.5 Width: (cm) 4.5 Depth: (cm) 0.1 Volume: (cm) 4.064 [name], [name] ([id number]) [id number] Page 2 of 18 Character of Wound/Ulcer Post Debridement: Requires Further Debridement Severity of Tissue Post Debridement: Fat layer exposed Post Procedure Diagnosis Same as Pre-procedure Electronic Signature(s) Signed: 02/10/2023 10:32:01 AM By: [name] Guess MD FACS Signed: 02/10/2023 4:30:21 PM By: [name] Entered By: [name] on 02/10/2023 10:06:35 -------------------------------------------------------------------------------- Debridement Details Patient Name: Date of Service: [name], [name] 02/10/2023 10:00 A M Medical Record Number: [id number] Patient Account Number: [id number] Date of Birth/Sex: Treating RN: August 11, 1970 (52 y.o. [name] Primary Care Provider: Julianne [name] Other Clinician: Referring Provider: Treating Provider/Extender: [name] in Treatment: 94 Debridement Performed for Assessment: Wound #17 Right,Lateral Lower Leg Performed By: Physician [name] Guess, MD The following information was scribed by: [name] The information was scribed for: [name] Debridement Type: Debridement Level of Consciousness (Pre-procedure): Awake and Alert Pre-procedure Verification/Time Out Yes - 10:06 Taken: Start Time: 10:06 Pain Control: Lidocaine 5% topical ointment Percent of Wound Bed Debrided: 50% T Area Debrided (cm): otal 28.26 Tissue and other material debrided: Non-Viable, Slough, Slough Level: Non-Viable  Tissue Debridement Description: Selective/Open Wound Instrument: Curette Specimen: Tissue Culture Number of Specimens T aken: 1 Bleeding: Minimum Hemostasis Achieved: Pressure Response to Treatment: Procedure was tolerated well Level of Consciousness (Post- Awake and Alert procedure): Post Debridement Measurements of Total Wound Length: (cm) 9 Width: (cm) 8 Depth: (cm) 0.1 Volume: (cm) 5.655 Character of Wound/Ulcer Post Debridement: Requires Further Debridement Post Procedure Diagnosis Same as Pre-procedure Electronic Signature(s) Signed: 02/10/2023 10:32:01 AM By: [name] Guess MD FACS Signed: 02/10/2023 4:30:21 PM By: [name] Entered By: [name] on 02/10/2023 10:09:15 [name] ([id number]) [id number] Page 3 of 18 -------------------------------------------------------------------------------- HPI Details Patient Name: Date  [name], [name] ([id number]) [id number] Page 1 of 18 Visit Report for 02/10/2023 Chief Complaint Document Details Patient Name: Date of Service: [name] 02/10/2023 10:00 A M Medical Record Number: [id number] Patient Account Number: [id number] Date of Birth/Sex: Treating RN: 08/08/1970 (52 y.o. 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 [name] Primary Care Provider: Julianne [name] Other Clinician: Referring Provider: Treating Provider/Extender: [name] in Treatment: 94 Debridement Performed for Assessment: Wound #21 Right,Medial Ankle Performed By: Physician [name] Guess, MD The following information was scribed by: [name] The information was scribed for: [name] Debridement Type: Debridement Severity of Tissue Pre Debridement: Fat layer exposed Level of Consciousness (Pre-procedure): Awake and Alert Pre-procedure Verification/Time Out Yes - 10:06 Taken: Start Time: 10:06 Pain Control: Lidocaine 5% topical ointment Percent of Wound Bed Debrided: 50% T Area Debrided (cm): otal 20.31 Tissue and other material  debrided: Non-Viable, Slough, Slough Level: Non-Viable Tissue Debridement Description: Selective/Open Wound Instrument: Curette Bleeding: Minimum Hemostasis Achieved: Pressure Response to Treatment: Procedure was tolerated well Level of Consciousness (Post- Awake and Alert procedure): Post Debridement Measurements of Total Wound Length: (cm) 11.5 Width: (cm) 4.5 Depth: (cm) 0.1 Volume: (cm) 4.064 [name], [name] ([id number]) [id number] Page 2 of 18 Character of Wound/Ulcer Post Debridement: Requires Further Debridement Severity of Tissue Post Debridement: Fat layer exposed Post Procedure Diagnosis Same as Pre-procedure Electronic Signature(s) Signed: 02/10/2023 10:32:01 AM By: [name] Guess MD FACS Signed: 02/10/2023 4:30:21 PM By: [name] Entered By: [name] on 02/10/2023 10:06:35 -------------------------------------------------------------------------------- Debridement Details Patient Name: Date of Service: [name], [name] 02/10/2023 10:00 A M Medical Record Number: [id number] Patient Account Number: [id number] Date of Birth/Sex: Treating RN: August 11, 1970 (52 y.o. [name] Primary Care Provider: Julianne [name] Other Clinician: Referring Provider: Treating Provider/Extender: [name] in Treatment: 94 Debridement Performed for Assessment: Wound #17 Right,Lateral Lower Leg Performed By: Physician [name] Guess, MD The following information was scribed by: [name] The information was scribed for: [name] Debridement Type: Debridement Level of Consciousness (Pre-procedure): Awake and Alert Pre-procedure Verification/Time Out Yes - 10:06 Taken: Start Time: 10:06 Pain Control: Lidocaine 5% topical ointment Percent of Wound Bed Debrided: 50% T Area Debrided (cm): otal 28.26 Tissue and other material debrided: Non-Viable, Slough, Slough Level: Non-Viable  Tissue Debridement Description: Selective/Open Wound Instrument: Curette Specimen: Tissue Culture Number of Specimens T aken: 1 Bleeding: Minimum Hemostasis Achieved: Pressure Response to Treatment: Procedure was tolerated well Level of Consciousness (Post- Awake and Alert procedure): Post Debridement Measurements of Total Wound Length: (cm) 9 Width: (cm) 8 Depth: (cm) 0.1 Volume: (cm) 5.655 Character of Wound/Ulcer Post Debridement: Requires Further Debridement Post Procedure Diagnosis Same as Pre-procedure Electronic Signature(s) Signed: 02/10/2023 10:32:01 AM By: [name] Guess MD FACS Signed: 02/10/2023 4:30:21 PM By: [name] Entered By: [name] on 02/10/2023 10:09:15 [name] ([id number]) [id number] Page 3 of 18 -------------------------------------------------------------------------------- HPI Details Patient Name: Date  [name] Dike MD FACS Signed: 02/12/2023 4:28:05 PM By: [name] Previous Signature: 02/10/2023 10:32:01 AM Version By: [name] Guess MD FACS Entered By: [name] on 02/12/2023 09:48:25 -------------------------------------------------------------------------------- Problem List Details Patient Name: Date of Service: [name], [name] 02/10/2023 10:00 A Stana Bunting ([id number]) [id number] Page 9 of 18 Medical Record Number: [id number] Patient Account Number: [id number] Date of Birth/Sex: Treating RN: 1971-05-04 (52 y.o. 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/17/2021 No Yes severity 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 04/17/2021 No Yes Inactive Problems ICD-10 Code Description Active Date Inactive Date L97.828 Non-pressure chronic ulcer of other part of left lower leg with other specified severity 04/17/2021 04/17/2021 Resolved Problems ICD-10 Code Description Active Date Resolved Date L97.822 Non-pressure chronic ulcer of other part of left lower leg with fat layer exposed 08/09/2022 08/09/2022 Electronic Signature(s) Signed: 02/10/2023 10:12:30 AM By: [name] Guess MD FACS Entered By: [name] on 02/10/2023  10:12:30 -------------------------------------------------------------------------------- Progress Note Details Patient Name: Date of Service: [name], [name] 02/10/2023 10:00 A M Medical Record Number: [id number] Patient Account Number: [id number] Date of Birth/Sex: Treating RN: 07/19/1970 (52 y.o. F) Primary Care Provider: Julianne [name] Other Clinician: Referring Provider: Treating Provider/Extender: [name] in Treatment: 73 Subjective Chief Complaint Information obtained from Patient the patient is here for evaluation of her bilateral lower extremity sickle cell ulcers 04/17/2021; patient comes in for substantial wounds on the right and left lower leg History of Present Illness (HPI) The following HPI elements were documented for the patient's wound: Location: medial and lateral ankle region on the right and left medial malleolus Quality: Patient reports experiencing a shooting pain to affected area(s). Severity: Patient states wound(s) are getting worse. Duration: right lower extremity bimalleolar ulcers have been present for approximately 2 years; the right medial malleolus ulcer has been there proximally 6 months Timing: Pain in wound is constant (hurts all the time) Context: The wound would happen gradually Associated Signs and Symptoms: Patient reports having increase discharge. [name], [name] ([id number]) [id number] Page 30 of 23 52 year old patient with a history of sickle cell anemia who was last seen by me with ulceration of the right lower extremity above the ankle and was referred to Dr. Marzetta Board for a surgical debridement as I was unable to do anything in the office due to excruciating pain. At that stage she was referred from the plastic surgery service to dermatology who treated her for a skin infection with doxycycline and then Levaquin and a local antibiotic ointment. I understand the patient has since  developed ulceration on the left ankle both medial and lateral and was now referred back to the wound center as dermatology has finished the management. I do not have any notes from the dermatology department Old notes: 52 year old patient with a history of sickle cell anemia, pain bilateral lower extremities, right lower extremity ulcer and has a history of receiving a skin graft( Theraskin) several months ago. She has been visiting the wound center [facility] and was seen by Dr. Leanord Hawking and Dr. Marzetta Board. after prolonged conservator therapy between July 2016 and January 2017. She had been seen by the plastic surgeon and taken to the OR for debridement and application of Theraskin. She had 3 applications of Theraskin and was then treated with collagen.  Discharge Instructions: Apply nickel thick amount to wound bed as instructed Secondary Dressing: ABD Pad, 5x9 1 x Per Week/30 Days Discharge Instructions: Apply over primary dressing as directed. Secondary Dressing: Woven Gauze Sponge, Non-Sterile 4x4 in 1 x Per Week/30 Days Discharge Instructions: Moisten with normal saline and Apply over primary dressing as directed. Secured With: Elastic Bandage 4 inch (ACE bandage) 1 x Per Week/30 Days Discharge Instructions: Secure with ACE bandage as directed. Secured With: [employer], 4.5x3.1 (in/yd) 1 x Per Week/30 Days Discharge Instructions: Secure with Kerlix as directed. WOUND #21: - Ankle Wound Laterality: Right, Medial Cleanser: Soap and Water 1 x Per Week/30 Days Discharge Instructions: May shower and wash wound with dial antibacterial soap and water prior to dressing change. Cleanser: Wound Cleanser 1 x Per Week/30 Days Discharge Instructions: Cleanse the wound with wound cleanser prior to applying a clean dressing using gauze sponges, not tissue or cotton balls. Peri-Wound Care: Triamcinolone 15 (g) 1 x Per Week/30 Days Discharge Instructions: Use triamcinolone 15 (g) as directed Peri-Wound Care: Sween Lotion (Moisturizing lotion) 1 x Per Week/30 Days Discharge Instructions: Apply moisturizing lotion as directed Prim Dressing: Santyl Ointment 1 x Per Week/30 Days ary Discharge Instructions: Apply nickel thick amount to wound bed as  instructed Secondary Dressing: ABD Pad, 5x9 1 x Per Week/30 Days Discharge Instructions: Apply over primary dressing as directed. Secondary Dressing: Woven Gauze Sponge, Non-Sterile 4x4 in 1 x Per Week/30 Days Discharge Instructions: Moisten with normal saline and Apply over primary dressing as directed. Secured With: Elastic Bandage 4 inch (ACE bandage) 1 x Per Week/30 Days Discharge Instructions: Secure with ACE bandage as directed. Secured With: [employer], 4.5x3.1 (in/yd) 1 x Per Week/30 Days Discharge Instructions: Secure with Kerlix as directed. 02/07/2023: Her wounds have gotten larger and more painful. She completed her course of antibiotics yesterday. She still has a fair amount of drainage coming from her wounds. I was only able to debride about 50% of each of her wounds, removing slough with a curette. She was unable to tolerate further debridement secondary to pain. I did take another culture due to the fact that her wounds got larger while on Augmentin (which was prescribed based on prior culture data). For now, we will continue Santyl with Kerlix and Ace bandaging. Follow-up in 2 [name]. Electronic Signature(s) Signed: 02/12/2023 10:24:27 AM By: [name] Guess MD FACS Signed: 02/12/2023 4:28:05 PM By: [name] Previous Signature: 02/10/2023 10:16:47 AM Version By: [name] Guess MD FACS Entered By: [name] on 02/12/2023 09:48:59 [name] ([id number]) [id number] Page 16 of 18 -------------------------------------------------------------------------------- HxROS Details Patient Name: Date of Service: [name] 02/10/2023 10:00 A M Medical Record Number: [id number] Patient Account Number: [id number] Date of Birth/Sex: Treating RN: 1971-02-22 (52 y.o. F) Primary Care Provider: Julianne [name] Other Clinician: Referring Provider: Treating Provider/Extender: [name] in  Treatment: 47 Information Obtained From Patient Constitutional Symptoms (General Health) Medical History: Past Medical History Notes: H/O miscarriage Eyes Medical History: Negative for: Cataracts; Glaucoma; Optic Neuritis Ear/Nose/Mouth/Throat Medical History: Negative for: Chronic sinus problems/congestion; Middle ear problems Hematologic/Lymphatic Medical History: Positive for: Anemia; Sickle Cell Disease Negative for: Hemophilia; Human Immunodeficiency Virus; Lymphedema Respiratory Medical History: Negative for: Aspiration; Asthma; Chronic Obstructive Pulmonary Disease (COPD); Pneumothorax; Sleep Apnea; Tuberculosis Cardiovascular Medical History: Negative for: Angina; Arrhythmia; Congestive Heart Failure; Coronary Artery Disease; Deep Vein Thrombosis; Hypertension; Hypotension; Myocardial Infarction; Peripheral Arterial Disease; Peripheral Venous Disease; Phlebitis; Vasculitis Past Medical History Notes: bradycardia Gastrointestinal Medical History: Negative  [name] Dike MD FACS Signed: 02/12/2023 4:28:05 PM By: [name] Previous Signature: 02/10/2023 10:32:01 AM Version By: [name] Guess MD FACS Entered By: [name] on 02/12/2023 09:48:25 -------------------------------------------------------------------------------- Problem List Details Patient Name: Date of Service: [name], [name] 02/10/2023 10:00 A Stana Bunting ([id number]) [id number] Page 9 of 18 Medical Record Number: [id number] Patient Account Number: [id number] Date of Birth/Sex: Treating RN: 1971-05-04 (52 y.o. 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/17/2021 No Yes severity 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 04/17/2021 No Yes Inactive Problems ICD-10 Code Description Active Date Inactive Date L97.828 Non-pressure chronic ulcer of other part of left lower leg with other specified severity 04/17/2021 04/17/2021 Resolved Problems ICD-10 Code Description Active Date Resolved Date L97.822 Non-pressure chronic ulcer of other part of left lower leg with fat layer exposed 08/09/2022 08/09/2022 Electronic Signature(s) Signed: 02/10/2023 10:12:30 AM By: [name] Guess MD FACS Entered By: [name] on 02/10/2023  10:12:30 -------------------------------------------------------------------------------- Progress Note Details Patient Name: Date of Service: [name], [name] 02/10/2023 10:00 A M Medical Record Number: [id number] Patient Account Number: [id number] Date of Birth/Sex: Treating RN: 07/19/1970 (52 y.o. F) Primary Care Provider: Julianne [name] Other Clinician: Referring Provider: Treating Provider/Extender: [name] in Treatment: 73 Subjective Chief Complaint Information obtained from Patient the patient is here for evaluation of her bilateral lower extremity sickle cell ulcers 04/17/2021; patient comes in for substantial wounds on the right and left lower leg History of Present Illness (HPI) The following HPI elements were documented for the patient's wound: Location: medial and lateral ankle region on the right and left medial malleolus Quality: Patient reports experiencing a shooting pain to affected area(s). Severity: Patient states wound(s) are getting worse. Duration: right lower extremity bimalleolar ulcers have been present for approximately 2 years; the right medial malleolus ulcer has been there proximally 6 months Timing: Pain in wound is constant (hurts all the time) Context: The wound would happen gradually Associated Signs and Symptoms: Patient reports having increase discharge. [name], [name] ([id number]) [id number] Page 30 of 23 52 year old patient with a history of sickle cell anemia who was last seen by me with ulceration of the right lower extremity above the ankle and was referred to Dr. Marzetta Board for a surgical debridement as I was unable to do anything in the office due to excruciating pain. At that stage she was referred from the plastic surgery service to dermatology who treated her for a skin infection with doxycycline and then Levaquin and a local antibiotic ointment. I understand the patient has since  developed ulceration on the left ankle both medial and lateral and was now referred back to the wound center as dermatology has finished the management. I do not have any notes from the dermatology department Old notes: 52 year old patient with a history of sickle cell anemia, pain bilateral lower extremities, right lower extremity ulcer and has a history of receiving a skin graft( Theraskin) several months ago. She has been visiting the wound center [facility] and was seen by Dr. Leanord Hawking and Dr. Marzetta Board. after prolonged conservator therapy between July 2016 and January 2017. She had been seen by the plastic surgeon and taken to the OR for debridement and application of Theraskin. She had 3 applications of Theraskin and was then treated with collagen.  [name] Dike MD FACS Signed: 02/12/2023 4:28:05 PM By: [name] Previous Signature: 02/10/2023 10:32:01 AM Version By: [name] Guess MD FACS Entered By: [name] on 02/12/2023 09:48:25 -------------------------------------------------------------------------------- Problem List Details Patient Name: Date of Service: [name], [name] 02/10/2023 10:00 A Stana Bunting ([id number]) [id number] Page 9 of 18 Medical Record Number: [id number] Patient Account Number: [id number] Date of Birth/Sex: Treating RN: 1971-05-04 (52 y.o. 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/17/2021 No Yes severity 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 04/17/2021 No Yes Inactive Problems ICD-10 Code Description Active Date Inactive Date L97.828 Non-pressure chronic ulcer of other part of left lower leg with other specified severity 04/17/2021 04/17/2021 Resolved Problems ICD-10 Code Description Active Date Resolved Date L97.822 Non-pressure chronic ulcer of other part of left lower leg with fat layer exposed 08/09/2022 08/09/2022 Electronic Signature(s) Signed: 02/10/2023 10:12:30 AM By: [name] Guess MD FACS Entered By: [name] on 02/10/2023  10:12:30 -------------------------------------------------------------------------------- Progress Note Details Patient Name: Date of Service: [name], [name] 02/10/2023 10:00 A M Medical Record Number: [id number] Patient Account Number: [id number] Date of Birth/Sex: Treating RN: 07/19/1970 (52 y.o. F) Primary Care Provider: Julianne [name] Other Clinician: Referring Provider: Treating Provider/Extender: [name] in Treatment: 73 Subjective Chief Complaint Information obtained from Patient the patient is here for evaluation of her bilateral lower extremity sickle cell ulcers 04/17/2021; patient comes in for substantial wounds on the right and left lower leg History of Present Illness (HPI) The following HPI elements were documented for the patient's wound: Location: medial and lateral ankle region on the right and left medial malleolus Quality: Patient reports experiencing a shooting pain to affected area(s). Severity: Patient states wound(s) are getting worse. Duration: right lower extremity bimalleolar ulcers have been present for approximately 2 years; the right medial malleolus ulcer has been there proximally 6 months Timing: Pain in wound is constant (hurts all the time) Context: The wound would happen gradually Associated Signs and Symptoms: Patient reports having increase discharge. [name], [name] ([id number]) [id number] Page 30 of 23 52 year old patient with a history of sickle cell anemia who was last seen by me with ulceration of the right lower extremity above the ankle and was referred to Dr. Marzetta Board for a surgical debridement as I was unable to do anything in the office due to excruciating pain. At that stage she was referred from the plastic surgery service to dermatology who treated her for a skin infection with doxycycline and then Levaquin and a local antibiotic ointment. I understand the patient has since  developed ulceration on the left ankle both medial and lateral and was now referred back to the wound center as dermatology has finished the management. I do not have any notes from the dermatology department Old notes: 52 year old patient with a history of sickle cell anemia, pain bilateral lower extremities, right lower extremity ulcer and has a history of receiving a skin graft( Theraskin) several months ago. She has been visiting the wound center [facility] and was seen by Dr. Leanord Hawking and Dr. Marzetta Board. after prolonged conservator therapy between July 2016 and January 2017. She had been seen by the plastic surgeon and taken to the OR for debridement and application of Theraskin. She had 3 applications of Theraskin and was then treated with collagen.  Discharge Instructions: Apply nickel thick amount to wound bed as instructed Secondary Dressing: ABD Pad, 5x9 1 x Per Week/30 Days Discharge Instructions: Apply over primary dressing as directed. Secondary Dressing: Woven Gauze Sponge, Non-Sterile 4x4 in 1 x Per Week/30 Days Discharge Instructions: Moisten with normal saline and Apply over primary dressing as directed. Secured With: Elastic Bandage 4 inch (ACE bandage) 1 x Per Week/30 Days Discharge Instructions: Secure with ACE bandage as directed. Secured With: [employer], 4.5x3.1 (in/yd) 1 x Per Week/30 Days Discharge Instructions: Secure with Kerlix as directed. WOUND #21: - Ankle Wound Laterality: Right, Medial Cleanser: Soap and Water 1 x Per Week/30 Days Discharge Instructions: May shower and wash wound with dial antibacterial soap and water prior to dressing change. Cleanser: Wound Cleanser 1 x Per Week/30 Days Discharge Instructions: Cleanse the wound with wound cleanser prior to applying a clean dressing using gauze sponges, not tissue or cotton balls. Peri-Wound Care: Triamcinolone 15 (g) 1 x Per Week/30 Days Discharge Instructions: Use triamcinolone 15 (g) as directed Peri-Wound Care: Sween Lotion (Moisturizing lotion) 1 x Per Week/30 Days Discharge Instructions: Apply moisturizing lotion as directed Prim Dressing: Santyl Ointment 1 x Per Week/30 Days ary Discharge Instructions: Apply nickel thick amount to wound bed as  instructed Secondary Dressing: ABD Pad, 5x9 1 x Per Week/30 Days Discharge Instructions: Apply over primary dressing as directed. Secondary Dressing: Woven Gauze Sponge, Non-Sterile 4x4 in 1 x Per Week/30 Days Discharge Instructions: Moisten with normal saline and Apply over primary dressing as directed. Secured With: Elastic Bandage 4 inch (ACE bandage) 1 x Per Week/30 Days Discharge Instructions: Secure with ACE bandage as directed. Secured With: [employer], 4.5x3.1 (in/yd) 1 x Per Week/30 Days Discharge Instructions: Secure with Kerlix as directed. 02/07/2023: Her wounds have gotten larger and more painful. She completed her course of antibiotics yesterday. She still has a fair amount of drainage coming from her wounds. I was only able to debride about 50% of each of her wounds, removing slough with a curette. She was unable to tolerate further debridement secondary to pain. I did take another culture due to the fact that her wounds got larger while on Augmentin (which was prescribed based on prior culture data). For now, we will continue Santyl with Kerlix and Ace bandaging. Follow-up in 2 [name]. Electronic Signature(s) Signed: 02/12/2023 10:24:27 AM By: [name] Guess MD FACS Signed: 02/12/2023 4:28:05 PM By: [name] Previous Signature: 02/10/2023 10:16:47 AM Version By: [name] Guess MD FACS Entered By: [name] on 02/12/2023 09:48:59 [name] ([id number]) [id number] Page 16 of 18 -------------------------------------------------------------------------------- HxROS Details Patient Name: Date of Service: [name] 02/10/2023 10:00 A M Medical Record Number: [id number] Patient Account Number: [id number] Date of Birth/Sex: Treating RN: 1971-02-22 (52 y.o. F) Primary Care Provider: Julianne [name] Other Clinician: Referring Provider: Treating Provider/Extender: [name] in  Treatment: 47 Information Obtained From Patient Constitutional Symptoms (General Health) Medical History: Past Medical History Notes: H/O miscarriage Eyes Medical History: Negative for: Cataracts; Glaucoma; Optic Neuritis Ear/Nose/Mouth/Throat Medical History: Negative for: Chronic sinus problems/congestion; Middle ear problems Hematologic/Lymphatic Medical History: Positive for: Anemia; Sickle Cell Disease Negative for: Hemophilia; Human Immunodeficiency Virus; Lymphedema Respiratory Medical History: Negative for: Aspiration; Asthma; Chronic Obstructive Pulmonary Disease (COPD); Pneumothorax; Sleep Apnea; Tuberculosis Cardiovascular Medical History: Negative for: Angina; Arrhythmia; Congestive Heart Failure; Coronary Artery Disease; Deep Vein Thrombosis; Hypertension; Hypotension; Myocardial Infarction; Peripheral Arterial Disease; Peripheral Venous Disease; Phlebitis; Vasculitis Past Medical History Notes: bradycardia Gastrointestinal Medical History: Negative  [name], [name] ([id number]) [id number] Page 1 of 18 Visit Report for 02/10/2023 Chief Complaint Document Details Patient Name: Date of Service: [name] 02/10/2023 10:00 A M Medical Record Number: [id number] Patient Account Number: [id number] Date of Birth/Sex: Treating RN: 08/08/1970 (52 y.o. 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 [name] Primary Care Provider: Julianne [name] Other Clinician: Referring Provider: Treating Provider/Extender: [name] in Treatment: 94 Debridement Performed for Assessment: Wound #21 Right,Medial Ankle Performed By: Physician [name] Guess, MD The following information was scribed by: [name] The information was scribed for: [name] Debridement Type: Debridement Severity of Tissue Pre Debridement: Fat layer exposed Level of Consciousness (Pre-procedure): Awake and Alert Pre-procedure Verification/Time Out Yes - 10:06 Taken: Start Time: 10:06 Pain Control: Lidocaine 5% topical ointment Percent of Wound Bed Debrided: 50% T Area Debrided (cm): otal 20.31 Tissue and other material  debrided: Non-Viable, Slough, Slough Level: Non-Viable Tissue Debridement Description: Selective/Open Wound Instrument: Curette Bleeding: Minimum Hemostasis Achieved: Pressure Response to Treatment: Procedure was tolerated well Level of Consciousness (Post- Awake and Alert procedure): Post Debridement Measurements of Total Wound Length: (cm) 11.5 Width: (cm) 4.5 Depth: (cm) 0.1 Volume: (cm) 4.064 [name], [name] ([id number]) [id number] Page 2 of 18 Character of Wound/Ulcer Post Debridement: Requires Further Debridement Severity of Tissue Post Debridement: Fat layer exposed Post Procedure Diagnosis Same as Pre-procedure Electronic Signature(s) Signed: 02/10/2023 10:32:01 AM By: [name] Guess MD FACS Signed: 02/10/2023 4:30:21 PM By: [name] Entered By: [name] on 02/10/2023 10:06:35 -------------------------------------------------------------------------------- Debridement Details Patient Name: Date of Service: [name], [name] 02/10/2023 10:00 A M Medical Record Number: [id number] Patient Account Number: [id number] Date of Birth/Sex: Treating RN: August 11, 1970 (52 y.o. [name] Primary Care Provider: Julianne [name] Other Clinician: Referring Provider: Treating Provider/Extender: [name] in Treatment: 94 Debridement Performed for Assessment: Wound #17 Right,Lateral Lower Leg Performed By: Physician [name] Guess, MD The following information was scribed by: [name] The information was scribed for: [name] Debridement Type: Debridement Level of Consciousness (Pre-procedure): Awake and Alert Pre-procedure Verification/Time Out Yes - 10:06 Taken: Start Time: 10:06 Pain Control: Lidocaine 5% topical ointment Percent of Wound Bed Debrided: 50% T Area Debrided (cm): otal 28.26 Tissue and other material debrided: Non-Viable, Slough, Slough Level: Non-Viable  Tissue Debridement Description: Selective/Open Wound Instrument: Curette Specimen: Tissue Culture Number of Specimens T aken: 1 Bleeding: Minimum Hemostasis Achieved: Pressure Response to Treatment: Procedure was tolerated well Level of Consciousness (Post- Awake and Alert procedure): Post Debridement Measurements of Total Wound Length: (cm) 9 Width: (cm) 8 Depth: (cm) 0.1 Volume: (cm) 5.655 Character of Wound/Ulcer Post Debridement: Requires Further Debridement Post Procedure Diagnosis Same as Pre-procedure Electronic Signature(s) Signed: 02/10/2023 10:32:01 AM By: [name] Guess MD FACS Signed: 02/10/2023 4:30:21 PM By: [name] Entered By: [name] on 02/10/2023 10:09:15 [name] ([id number]) [id number] Page 3 of 18 -------------------------------------------------------------------------------- HPI Details Patient Name: Date  [name], [name] ([id number]) [id number] Page 1 of 18 Visit Report for 02/10/2023 Chief Complaint Document Details Patient Name: Date of Service: [name] 02/10/2023 10:00 A M Medical Record Number: [id number] Patient Account Number: [id number] Date of Birth/Sex: Treating RN: 08/08/1970 (52 y.o. 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 [name] Primary Care Provider: Julianne [name] Other Clinician: Referring Provider: Treating Provider/Extender: [name] in Treatment: 94 Debridement Performed for Assessment: Wound #21 Right,Medial Ankle Performed By: Physician [name] Guess, MD The following information was scribed by: [name] The information was scribed for: [name] Debridement Type: Debridement Severity of Tissue Pre Debridement: Fat layer exposed Level of Consciousness (Pre-procedure): Awake and Alert Pre-procedure Verification/Time Out Yes - 10:06 Taken: Start Time: 10:06 Pain Control: Lidocaine 5% topical ointment Percent of Wound Bed Debrided: 50% T Area Debrided (cm): otal 20.31 Tissue and other material  debrided: Non-Viable, Slough, Slough Level: Non-Viable Tissue Debridement Description: Selective/Open Wound Instrument: Curette Bleeding: Minimum Hemostasis Achieved: Pressure Response to Treatment: Procedure was tolerated well Level of Consciousness (Post- Awake and Alert procedure): Post Debridement Measurements of Total Wound Length: (cm) 11.5 Width: (cm) 4.5 Depth: (cm) 0.1 Volume: (cm) 4.064 [name], [name] ([id number]) [id number] Page 2 of 18 Character of Wound/Ulcer Post Debridement: Requires Further Debridement Severity of Tissue Post Debridement: Fat layer exposed Post Procedure Diagnosis Same as Pre-procedure Electronic Signature(s) Signed: 02/10/2023 10:32:01 AM By: [name] Guess MD FACS Signed: 02/10/2023 4:30:21 PM By: [name] Entered By: [name] on 02/10/2023 10:06:35 -------------------------------------------------------------------------------- Debridement Details Patient Name: Date of Service: [name], [name] 02/10/2023 10:00 A M Medical Record Number: [id number] Patient Account Number: [id number] Date of Birth/Sex: Treating RN: August 11, 1970 (52 y.o. [name] Primary Care Provider: Julianne [name] Other Clinician: Referring Provider: Treating Provider/Extender: [name] in Treatment: 94 Debridement Performed for Assessment: Wound #17 Right,Lateral Lower Leg Performed By: Physician [name] Guess, MD The following information was scribed by: [name] The information was scribed for: [name] Debridement Type: Debridement Level of Consciousness (Pre-procedure): Awake and Alert Pre-procedure Verification/Time Out Yes - 10:06 Taken: Start Time: 10:06 Pain Control: Lidocaine 5% topical ointment Percent of Wound Bed Debrided: 50% T Area Debrided (cm): otal 28.26 Tissue and other material debrided: Non-Viable, Slough, Slough Level: Non-Viable  Tissue Debridement Description: Selective/Open Wound Instrument: Curette Specimen: Tissue Culture Number of Specimens T aken: 1 Bleeding: Minimum Hemostasis Achieved: Pressure Response to Treatment: Procedure was tolerated well Level of Consciousness (Post- Awake and Alert procedure): Post Debridement Measurements of Total Wound Length: (cm) 9 Width: (cm) 8 Depth: (cm) 0.1 Volume: (cm) 5.655 Character of Wound/Ulcer Post Debridement: Requires Further Debridement Post Procedure Diagnosis Same as Pre-procedure Electronic Signature(s) Signed: 02/10/2023 10:32:01 AM By: [name] Guess MD FACS Signed: 02/10/2023 4:30:21 PM By: [name] Entered By: [name] on 02/10/2023 10:09:15 [name] ([id number]) [id number] Page 3 of 18 -------------------------------------------------------------------------------- HPI Details Patient Name: Date  [name], [name] ([id number]) [id number] Page 1 of 18 Visit Report for 02/10/2023 Chief Complaint Document Details Patient Name: Date of Service: [name] 02/10/2023 10:00 A M Medical Record Number: [id number] Patient Account Number: [id number] Date of Birth/Sex: Treating RN: 08/08/1970 (52 y.o. 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 [name] Primary Care Provider: Julianne [name] Other Clinician: Referring Provider: Treating Provider/Extender: [name] in Treatment: 94 Debridement Performed for Assessment: Wound #21 Right,Medial Ankle Performed By: Physician [name] Guess, MD The following information was scribed by: [name] The information was scribed for: [name] Debridement Type: Debridement Severity of Tissue Pre Debridement: Fat layer exposed Level of Consciousness (Pre-procedure): Awake and Alert Pre-procedure Verification/Time Out Yes - 10:06 Taken: Start Time: 10:06 Pain Control: Lidocaine 5% topical ointment Percent of Wound Bed Debrided: 50% T Area Debrided (cm): otal 20.31 Tissue and other material  debrided: Non-Viable, Slough, Slough Level: Non-Viable Tissue Debridement Description: Selective/Open Wound Instrument: Curette Bleeding: Minimum Hemostasis Achieved: Pressure Response to Treatment: Procedure was tolerated well Level of Consciousness (Post- Awake and Alert procedure): Post Debridement Measurements of Total Wound Length: (cm) 11.5 Width: (cm) 4.5 Depth: (cm) 0.1 Volume: (cm) 4.064 [name], [name] ([id number]) [id number] Page 2 of 18 Character of Wound/Ulcer Post Debridement: Requires Further Debridement Severity of Tissue Post Debridement: Fat layer exposed Post Procedure Diagnosis Same as Pre-procedure Electronic Signature(s) Signed: 02/10/2023 10:32:01 AM By: [name] Guess MD FACS Signed: 02/10/2023 4:30:21 PM By: [name] Entered By: [name] on 02/10/2023 10:06:35 -------------------------------------------------------------------------------- Debridement Details Patient Name: Date of Service: [name], [name] 02/10/2023 10:00 A M Medical Record Number: [id number] Patient Account Number: [id number] Date of Birth/Sex: Treating RN: August 11, 1970 (52 y.o. [name] Primary Care Provider: Julianne [name] Other Clinician: Referring Provider: Treating Provider/Extender: [name] in Treatment: 94 Debridement Performed for Assessment: Wound #17 Right,Lateral Lower Leg Performed By: Physician [name] Guess, MD The following information was scribed by: [name] The information was scribed for: [name] Debridement Type: Debridement Level of Consciousness (Pre-procedure): Awake and Alert Pre-procedure Verification/Time Out Yes - 10:06 Taken: Start Time: 10:06 Pain Control: Lidocaine 5% topical ointment Percent of Wound Bed Debrided: 50% T Area Debrided (cm): otal 28.26 Tissue and other material debrided: Non-Viable, Slough, Slough Level: Non-Viable  Tissue Debridement Description: Selective/Open Wound Instrument: Curette Specimen: Tissue Culture Number of Specimens T aken: 1 Bleeding: Minimum Hemostasis Achieved: Pressure Response to Treatment: Procedure was tolerated well Level of Consciousness (Post- Awake and Alert procedure): Post Debridement Measurements of Total Wound Length: (cm) 9 Width: (cm) 8 Depth: (cm) 0.1 Volume: (cm) 5.655 Character of Wound/Ulcer Post Debridement: Requires Further Debridement Post Procedure Diagnosis Same as Pre-procedure Electronic Signature(s) Signed: 02/10/2023 10:32:01 AM By: [name] Guess MD FACS Signed: 02/10/2023 4:30:21 PM By: [name] Entered By: [name] on 02/10/2023 10:09:15 [name] ([id number]) [id number] Page 3 of 18 -------------------------------------------------------------------------------- HPI Details Patient Name: Date  [name] Dike MD FACS Signed: 02/12/2023 4:28:05 PM By: [name] Previous Signature: 02/10/2023 10:32:01 AM Version By: [name] Guess MD FACS Entered By: [name] on 02/12/2023 09:48:25 -------------------------------------------------------------------------------- Problem List Details Patient Name: Date of Service: [name], [name] 02/10/2023 10:00 A Stana Bunting ([id number]) [id number] Page 9 of 18 Medical Record Number: [id number] Patient Account Number: [id number] Date of Birth/Sex: Treating RN: 1971-05-04 (52 y.o. 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/17/2021 No Yes severity 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 04/17/2021 No Yes Inactive Problems ICD-10 Code Description Active Date Inactive Date L97.828 Non-pressure chronic ulcer of other part of left lower leg with other specified severity 04/17/2021 04/17/2021 Resolved Problems ICD-10 Code Description Active Date Resolved Date L97.822 Non-pressure chronic ulcer of other part of left lower leg with fat layer exposed 08/09/2022 08/09/2022 Electronic Signature(s) Signed: 02/10/2023 10:12:30 AM By: [name] Guess MD FACS Entered By: [name] on 02/10/2023  10:12:30 -------------------------------------------------------------------------------- Progress Note Details Patient Name: Date of Service: [name], [name] 02/10/2023 10:00 A M Medical Record Number: [id number] Patient Account Number: [id number] Date of Birth/Sex: Treating RN: 07/19/1970 (52 y.o. F) Primary Care Provider: Julianne [name] Other Clinician: Referring Provider: Treating Provider/Extender: [name] in Treatment: 73 Subjective Chief Complaint Information obtained from Patient the patient is here for evaluation of her bilateral lower extremity sickle cell ulcers 04/17/2021; patient comes in for substantial wounds on the right and left lower leg History of Present Illness (HPI) The following HPI elements were documented for the patient's wound: Location: medial and lateral ankle region on the right and left medial malleolus Quality: Patient reports experiencing a shooting pain to affected area(s). Severity: Patient states wound(s) are getting worse. Duration: right lower extremity bimalleolar ulcers have been present for approximately 2 years; the right medial malleolus ulcer has been there proximally 6 months Timing: Pain in wound is constant (hurts all the time) Context: The wound would happen gradually Associated Signs and Symptoms: Patient reports having increase discharge. [name], [name] ([id number]) [id number] Page 30 of 23 52 year old patient with a history of sickle cell anemia who was last seen by me with ulceration of the right lower extremity above the ankle and was referred to Dr. Marzetta Board for a surgical debridement as I was unable to do anything in the office due to excruciating pain. At that stage she was referred from the plastic surgery service to dermatology who treated her for a skin infection with doxycycline and then Levaquin and a local antibiotic ointment. I understand the patient has since  developed ulceration on the left ankle both medial and lateral and was now referred back to the wound center as dermatology has finished the management. I do not have any notes from the dermatology department Old notes: 52 year old patient with a history of sickle cell anemia, pain bilateral lower extremities, right lower extremity ulcer and has a history of receiving a skin graft( Theraskin) several months ago. She has been visiting the wound center [facility] and was seen by Dr. Leanord Hawking and Dr. Marzetta Board. after prolonged conservator therapy between July 2016 and January 2017. She had been seen by the plastic surgeon and taken to the OR for debridement and application of Theraskin. She had 3 applications of Theraskin and was then treated with collagen.  [name], [name] ([id number]) [id number] Page 1 of 18 Visit Report for 02/10/2023 Chief Complaint Document Details Patient Name: Date of Service: [name] 02/10/2023 10:00 A M Medical Record Number: [id number] Patient Account Number: [id number] Date of Birth/Sex: Treating RN: 08/08/1970 (52 y.o. 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 [name] Primary Care Provider: Julianne [name] Other Clinician: Referring Provider: Treating Provider/Extender: [name] in Treatment: 94 Debridement Performed for Assessment: Wound #21 Right,Medial Ankle Performed By: Physician [name] Guess, MD The following information was scribed by: [name] The information was scribed for: [name] Debridement Type: Debridement Severity of Tissue Pre Debridement: Fat layer exposed Level of Consciousness (Pre-procedure): Awake and Alert Pre-procedure Verification/Time Out Yes - 10:06 Taken: Start Time: 10:06 Pain Control: Lidocaine 5% topical ointment Percent of Wound Bed Debrided: 50% T Area Debrided (cm): otal 20.31 Tissue and other material  debrided: Non-Viable, Slough, Slough Level: Non-Viable Tissue Debridement Description: Selective/Open Wound Instrument: Curette Bleeding: Minimum Hemostasis Achieved: Pressure Response to Treatment: Procedure was tolerated well Level of Consciousness (Post- Awake and Alert procedure): Post Debridement Measurements of Total Wound Length: (cm) 11.5 Width: (cm) 4.5 Depth: (cm) 0.1 Volume: (cm) 4.064 [name], [name] ([id number]) [id number] Page 2 of 18 Character of Wound/Ulcer Post Debridement: Requires Further Debridement Severity of Tissue Post Debridement: Fat layer exposed Post Procedure Diagnosis Same as Pre-procedure Electronic Signature(s) Signed: 02/10/2023 10:32:01 AM By: [name] Guess MD FACS Signed: 02/10/2023 4:30:21 PM By: [name] Entered By: [name] on 02/10/2023 10:06:35 -------------------------------------------------------------------------------- Debridement Details Patient Name: Date of Service: [name], [name] 02/10/2023 10:00 A M Medical Record Number: [id number] Patient Account Number: [id number] Date of Birth/Sex: Treating RN: August 11, 1970 (52 y.o. [name] Primary Care Provider: Julianne [name] Other Clinician: Referring Provider: Treating Provider/Extender: [name] in Treatment: 94 Debridement Performed for Assessment: Wound #17 Right,Lateral Lower Leg Performed By: Physician [name] Guess, MD The following information was scribed by: [name] The information was scribed for: [name] Debridement Type: Debridement Level of Consciousness (Pre-procedure): Awake and Alert Pre-procedure Verification/Time Out Yes - 10:06 Taken: Start Time: 10:06 Pain Control: Lidocaine 5% topical ointment Percent of Wound Bed Debrided: 50% T Area Debrided (cm): otal 28.26 Tissue and other material debrided: Non-Viable, Slough, Slough Level: Non-Viable  Tissue Debridement Description: Selective/Open Wound Instrument: Curette Specimen: Tissue Culture Number of Specimens T aken: 1 Bleeding: Minimum Hemostasis Achieved: Pressure Response to Treatment: Procedure was tolerated well Level of Consciousness (Post- Awake and Alert procedure): Post Debridement Measurements of Total Wound Length: (cm) 9 Width: (cm) 8 Depth: (cm) 0.1 Volume: (cm) 5.655 Character of Wound/Ulcer Post Debridement: Requires Further Debridement Post Procedure Diagnosis Same as Pre-procedure Electronic Signature(s) Signed: 02/10/2023 10:32:01 AM By: [name] Guess MD FACS Signed: 02/10/2023 4:30:21 PM By: [name] Entered By: [name] on 02/10/2023 10:09:15 [name] ([id number]) [id number] Page 3 of 18 -------------------------------------------------------------------------------- HPI Details Patient Name: Date  [name] Dike MD FACS Signed: 02/12/2023 4:28:05 PM By: [name] Previous Signature: 02/10/2023 10:32:01 AM Version By: [name] Guess MD FACS Entered By: [name] on 02/12/2023 09:48:25 -------------------------------------------------------------------------------- Problem List Details Patient Name: Date of Service: [name], [name] 02/10/2023 10:00 A Stana Bunting ([id number]) [id number] Page 9 of 18 Medical Record Number: [id number] Patient Account Number: [id number] Date of Birth/Sex: Treating RN: 1971-05-04 (52 y.o. 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/17/2021 No Yes severity 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 04/17/2021 No Yes Inactive Problems ICD-10 Code Description Active Date Inactive Date L97.828 Non-pressure chronic ulcer of other part of left lower leg with other specified severity 04/17/2021 04/17/2021 Resolved Problems ICD-10 Code Description Active Date Resolved Date L97.822 Non-pressure chronic ulcer of other part of left lower leg with fat layer exposed 08/09/2022 08/09/2022 Electronic Signature(s) Signed: 02/10/2023 10:12:30 AM By: [name] Guess MD FACS Entered By: [name] on 02/10/2023  10:12:30 -------------------------------------------------------------------------------- Progress Note Details Patient Name: Date of Service: [name], [name] 02/10/2023 10:00 A M Medical Record Number: [id number] Patient Account Number: [id number] Date of Birth/Sex: Treating RN: 07/19/1970 (52 y.o. F) Primary Care Provider: Julianne [name] Other Clinician: Referring Provider: Treating Provider/Extender: [name] in Treatment: 73 Subjective Chief Complaint Information obtained from Patient the patient is here for evaluation of her bilateral lower extremity sickle cell ulcers 04/17/2021; patient comes in for substantial wounds on the right and left lower leg History of Present Illness (HPI) The following HPI elements were documented for the patient's wound: Location: medial and lateral ankle region on the right and left medial malleolus Quality: Patient reports experiencing a shooting pain to affected area(s). Severity: Patient states wound(s) are getting worse. Duration: right lower extremity bimalleolar ulcers have been present for approximately 2 years; the right medial malleolus ulcer has been there proximally 6 months Timing: Pain in wound is constant (hurts all the time) Context: The wound would happen gradually Associated Signs and Symptoms: Patient reports having increase discharge. [name], [name] ([id number]) [id number] Page 30 of 23 52 year old patient with a history of sickle cell anemia who was last seen by me with ulceration of the right lower extremity above the ankle and was referred to Dr. Marzetta Board for a surgical debridement as I was unable to do anything in the office due to excruciating pain. At that stage she was referred from the plastic surgery service to dermatology who treated her for a skin infection with doxycycline and then Levaquin and a local antibiotic ointment. I understand the patient has since  developed ulceration on the left ankle both medial and lateral and was now referred back to the wound center as dermatology has finished the management. I do not have any notes from the dermatology department Old notes: 52 year old patient with a history of sickle cell anemia, pain bilateral lower extremities, right lower extremity ulcer and has a history of receiving a skin graft( Theraskin) several months ago. She has been visiting the wound center [facility] and was seen by Dr. Leanord Hawking and Dr. Marzetta Board. after prolonged conservator therapy between July 2016 and January 2017. She had been seen by the plastic surgeon and taken to the OR for debridement and application of Theraskin. She had 3 applications of Theraskin and was then treated with collagen.  Discharge Instructions: Apply nickel thick amount to wound bed as instructed Secondary Dressing: ABD Pad, 5x9 1 x Per Week/30 Days Discharge Instructions: Apply over primary dressing as directed. Secondary Dressing: Woven Gauze Sponge, Non-Sterile 4x4 in 1 x Per Week/30 Days Discharge Instructions: Moisten with normal saline and Apply over primary dressing as directed. Secured With: Elastic Bandage 4 inch (ACE bandage) 1 x Per Week/30 Days Discharge Instructions: Secure with ACE bandage as directed. Secured With: [employer], 4.5x3.1 (in/yd) 1 x Per Week/30 Days Discharge Instructions: Secure with Kerlix as directed. WOUND #21: - Ankle Wound Laterality: Right, Medial Cleanser: Soap and Water 1 x Per Week/30 Days Discharge Instructions: May shower and wash wound with dial antibacterial soap and water prior to dressing change. Cleanser: Wound Cleanser 1 x Per Week/30 Days Discharge Instructions: Cleanse the wound with wound cleanser prior to applying a clean dressing using gauze sponges, not tissue or cotton balls. Peri-Wound Care: Triamcinolone 15 (g) 1 x Per Week/30 Days Discharge Instructions: Use triamcinolone 15 (g) as directed Peri-Wound Care: Sween Lotion (Moisturizing lotion) 1 x Per Week/30 Days Discharge Instructions: Apply moisturizing lotion as directed Prim Dressing: Santyl Ointment 1 x Per Week/30 Days ary Discharge Instructions: Apply nickel thick amount to wound bed as  instructed Secondary Dressing: ABD Pad, 5x9 1 x Per Week/30 Days Discharge Instructions: Apply over primary dressing as directed. Secondary Dressing: Woven Gauze Sponge, Non-Sterile 4x4 in 1 x Per Week/30 Days Discharge Instructions: Moisten with normal saline and Apply over primary dressing as directed. Secured With: Elastic Bandage 4 inch (ACE bandage) 1 x Per Week/30 Days Discharge Instructions: Secure with ACE bandage as directed. Secured With: [employer], 4.5x3.1 (in/yd) 1 x Per Week/30 Days Discharge Instructions: Secure with Kerlix as directed. 02/07/2023: Her wounds have gotten larger and more painful. She completed her course of antibiotics yesterday. She still has a fair amount of drainage coming from her wounds. I was only able to debride about 50% of each of her wounds, removing slough with a curette. She was unable to tolerate further debridement secondary to pain. I did take another culture due to the fact that her wounds got larger while on Augmentin (which was prescribed based on prior culture data). For now, we will continue Santyl with Kerlix and Ace bandaging. Follow-up in 2 [name]. Electronic Signature(s) Signed: 02/12/2023 10:24:27 AM By: [name] Guess MD FACS Signed: 02/12/2023 4:28:05 PM By: [name] Previous Signature: 02/10/2023 10:16:47 AM Version By: [name] Guess MD FACS Entered By: [name] on 02/12/2023 09:48:59 [name] ([id number]) [id number] Page 16 of 18 -------------------------------------------------------------------------------- HxROS Details Patient Name: Date of Service: [name] 02/10/2023 10:00 A M Medical Record Number: [id number] Patient Account Number: [id number] Date of Birth/Sex: Treating RN: 1971-02-22 (52 y.o. F) Primary Care Provider: Julianne [name] Other Clinician: Referring Provider: Treating Provider/Extender: [name] in  Treatment: 47 Information Obtained From Patient Constitutional Symptoms (General Health) Medical History: Past Medical History Notes: H/O miscarriage Eyes Medical History: Negative for: Cataracts; Glaucoma; Optic Neuritis Ear/Nose/Mouth/Throat Medical History: Negative for: Chronic sinus problems/congestion; Middle ear problems Hematologic/Lymphatic Medical History: Positive for: Anemia; Sickle Cell Disease Negative for: Hemophilia; Human Immunodeficiency Virus; Lymphedema Respiratory Medical History: Negative for: Aspiration; Asthma; Chronic Obstructive Pulmonary Disease (COPD); Pneumothorax; Sleep Apnea; Tuberculosis Cardiovascular Medical History: Negative for: Angina; Arrhythmia; Congestive Heart Failure; Coronary Artery Disease; Deep Vein Thrombosis; Hypertension; Hypotension; Myocardial Infarction; Peripheral Arterial Disease; Peripheral Venous Disease; Phlebitis; Vasculitis Past Medical History Notes: bradycardia Gastrointestinal Medical History: Negative  Discharge Instructions: Apply nickel thick amount to wound bed as instructed Secondary Dressing: ABD Pad, 5x9 1 x Per Week/30 Days Discharge Instructions: Apply over primary dressing as directed. Secondary Dressing: Woven Gauze Sponge, Non-Sterile 4x4 in 1 x Per Week/30 Days Discharge Instructions: Moisten with normal saline and Apply over primary dressing as directed. Secured With: Elastic Bandage 4 inch (ACE bandage) 1 x Per Week/30 Days Discharge Instructions: Secure with ACE bandage as directed. Secured With: [employer], 4.5x3.1 (in/yd) 1 x Per Week/30 Days Discharge Instructions: Secure with Kerlix as directed. WOUND #21: - Ankle Wound Laterality: Right, Medial Cleanser: Soap and Water 1 x Per Week/30 Days Discharge Instructions: May shower and wash wound with dial antibacterial soap and water prior to dressing change. Cleanser: Wound Cleanser 1 x Per Week/30 Days Discharge Instructions: Cleanse the wound with wound cleanser prior to applying a clean dressing using gauze sponges, not tissue or cotton balls. Peri-Wound Care: Triamcinolone 15 (g) 1 x Per Week/30 Days Discharge Instructions: Use triamcinolone 15 (g) as directed Peri-Wound Care: Sween Lotion (Moisturizing lotion) 1 x Per Week/30 Days Discharge Instructions: Apply moisturizing lotion as directed Prim Dressing: Santyl Ointment 1 x Per Week/30 Days ary Discharge Instructions: Apply nickel thick amount to wound bed as  instructed Secondary Dressing: ABD Pad, 5x9 1 x Per Week/30 Days Discharge Instructions: Apply over primary dressing as directed. Secondary Dressing: Woven Gauze Sponge, Non-Sterile 4x4 in 1 x Per Week/30 Days Discharge Instructions: Moisten with normal saline and Apply over primary dressing as directed. Secured With: Elastic Bandage 4 inch (ACE bandage) 1 x Per Week/30 Days Discharge Instructions: Secure with ACE bandage as directed. Secured With: [employer], 4.5x3.1 (in/yd) 1 x Per Week/30 Days Discharge Instructions: Secure with Kerlix as directed. 02/07/2023: Her wounds have gotten larger and more painful. She completed her course of antibiotics yesterday. She still has a fair amount of drainage coming from her wounds. I was only able to debride about 50% of each of her wounds, removing slough with a curette. She was unable to tolerate further debridement secondary to pain. I did take another culture due to the fact that her wounds got larger while on Augmentin (which was prescribed based on prior culture data). For now, we will continue Santyl with Kerlix and Ace bandaging. Follow-up in 2 [name]. Electronic Signature(s) Signed: 02/12/2023 10:24:27 AM By: [name] Guess MD FACS Signed: 02/12/2023 4:28:05 PM By: [name] Previous Signature: 02/10/2023 10:16:47 AM Version By: [name] Guess MD FACS Entered By: [name] on 02/12/2023 09:48:59 [name] ([id number]) [id number] Page 16 of 18 -------------------------------------------------------------------------------- HxROS Details Patient Name: Date of Service: [name] 02/10/2023 10:00 A M Medical Record Number: [id number] Patient Account Number: [id number] Date of Birth/Sex: Treating RN: 1971-02-22 (52 y.o. F) Primary Care Provider: Julianne [name] Other Clinician: Referring Provider: Treating Provider/Extender: [name] in  Treatment: 47 Information Obtained From Patient Constitutional Symptoms (General Health) Medical History: Past Medical History Notes: H/O miscarriage Eyes Medical History: Negative for: Cataracts; Glaucoma; Optic Neuritis Ear/Nose/Mouth/Throat Medical History: Negative for: Chronic sinus problems/congestion; Middle ear problems Hematologic/Lymphatic Medical History: Positive for: Anemia; Sickle Cell Disease Negative for: Hemophilia; Human Immunodeficiency Virus; Lymphedema Respiratory Medical History: Negative for: Aspiration; Asthma; Chronic Obstructive Pulmonary Disease (COPD); Pneumothorax; Sleep Apnea; Tuberculosis Cardiovascular Medical History: Negative for: Angina; Arrhythmia; Congestive Heart Failure; Coronary Artery Disease; Deep Vein Thrombosis; Hypertension; Hypotension; Myocardial Infarction; Peripheral Arterial Disease; Peripheral Venous Disease; Phlebitis; Vasculitis Past Medical History Notes: bradycardia Gastrointestinal Medical History: Negative  Discharge Instructions: Apply nickel thick amount to wound bed as instructed Secondary Dressing: ABD Pad, 5x9 1 x Per Week/30 Days Discharge Instructions: Apply over primary dressing as directed. Secondary Dressing: Woven Gauze Sponge, Non-Sterile 4x4 in 1 x Per Week/30 Days Discharge Instructions: Moisten with normal saline and Apply over primary dressing as directed. Secured With: Elastic Bandage 4 inch (ACE bandage) 1 x Per Week/30 Days Discharge Instructions: Secure with ACE bandage as directed. Secured With: [employer], 4.5x3.1 (in/yd) 1 x Per Week/30 Days Discharge Instructions: Secure with Kerlix as directed. WOUND #21: - Ankle Wound Laterality: Right, Medial Cleanser: Soap and Water 1 x Per Week/30 Days Discharge Instructions: May shower and wash wound with dial antibacterial soap and water prior to dressing change. Cleanser: Wound Cleanser 1 x Per Week/30 Days Discharge Instructions: Cleanse the wound with wound cleanser prior to applying a clean dressing using gauze sponges, not tissue or cotton balls. Peri-Wound Care: Triamcinolone 15 (g) 1 x Per Week/30 Days Discharge Instructions: Use triamcinolone 15 (g) as directed Peri-Wound Care: Sween Lotion (Moisturizing lotion) 1 x Per Week/30 Days Discharge Instructions: Apply moisturizing lotion as directed Prim Dressing: Santyl Ointment 1 x Per Week/30 Days ary Discharge Instructions: Apply nickel thick amount to wound bed as  instructed Secondary Dressing: ABD Pad, 5x9 1 x Per Week/30 Days Discharge Instructions: Apply over primary dressing as directed. Secondary Dressing: Woven Gauze Sponge, Non-Sterile 4x4 in 1 x Per Week/30 Days Discharge Instructions: Moisten with normal saline and Apply over primary dressing as directed. Secured With: Elastic Bandage 4 inch (ACE bandage) 1 x Per Week/30 Days Discharge Instructions: Secure with ACE bandage as directed. Secured With: [employer], 4.5x3.1 (in/yd) 1 x Per Week/30 Days Discharge Instructions: Secure with Kerlix as directed. 02/07/2023: Her wounds have gotten larger and more painful. She completed her course of antibiotics yesterday. She still has a fair amount of drainage coming from her wounds. I was only able to debride about 50% of each of her wounds, removing slough with a curette. She was unable to tolerate further debridement secondary to pain. I did take another culture due to the fact that her wounds got larger while on Augmentin (which was prescribed based on prior culture data). For now, we will continue Santyl with Kerlix and Ace bandaging. Follow-up in 2 [name]. Electronic Signature(s) Signed: 02/12/2023 10:24:27 AM By: [name] Guess MD FACS Signed: 02/12/2023 4:28:05 PM By: [name] Previous Signature: 02/10/2023 10:16:47 AM Version By: [name] Guess MD FACS Entered By: [name] on 02/12/2023 09:48:59 [name] ([id number]) [id number] Page 16 of 18 -------------------------------------------------------------------------------- HxROS Details Patient Name: Date of Service: [name] 02/10/2023 10:00 A M Medical Record Number: [id number] Patient Account Number: [id number] Date of Birth/Sex: Treating RN: 1971-02-22 (52 y.o. F) Primary Care Provider: Julianne [name] Other Clinician: Referring Provider: Treating Provider/Extender: [name] in  Treatment: 47 Information Obtained From Patient Constitutional Symptoms (General Health) Medical History: Past Medical History Notes: H/O miscarriage Eyes Medical History: Negative for: Cataracts; Glaucoma; Optic Neuritis Ear/Nose/Mouth/Throat Medical History: Negative for: Chronic sinus problems/congestion; Middle ear problems Hematologic/Lymphatic Medical History: Positive for: Anemia; Sickle Cell Disease Negative for: Hemophilia; Human Immunodeficiency Virus; Lymphedema Respiratory Medical History: Negative for: Aspiration; Asthma; Chronic Obstructive Pulmonary Disease (COPD); Pneumothorax; Sleep Apnea; Tuberculosis Cardiovascular Medical History: Negative for: Angina; Arrhythmia; Congestive Heart Failure; Coronary Artery Disease; Deep Vein Thrombosis; Hypertension; Hypotension; Myocardial Infarction; Peripheral Arterial Disease; Peripheral Venous Disease; Phlebitis; Vasculitis Past Medical History Notes: bradycardia Gastrointestinal Medical History: Negative  [name] Dike MD FACS Signed: 02/12/2023 4:28:05 PM By: [name] Previous Signature: 02/10/2023 10:32:01 AM Version By: [name] Guess MD FACS Entered By: [name] on 02/12/2023 09:48:25 -------------------------------------------------------------------------------- Problem List Details Patient Name: Date of Service: [name], [name] 02/10/2023 10:00 A Stana Bunting ([id number]) [id number] Page 9 of 18 Medical Record Number: [id number] Patient Account Number: [id number] Date of Birth/Sex: Treating RN: 1971-05-04 (52 y.o. 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/17/2021 No Yes severity 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 04/17/2021 No Yes Inactive Problems ICD-10 Code Description Active Date Inactive Date L97.828 Non-pressure chronic ulcer of other part of left lower leg with other specified severity 04/17/2021 04/17/2021 Resolved Problems ICD-10 Code Description Active Date Resolved Date L97.822 Non-pressure chronic ulcer of other part of left lower leg with fat layer exposed 08/09/2022 08/09/2022 Electronic Signature(s) Signed: 02/10/2023 10:12:30 AM By: [name] Guess MD FACS Entered By: [name] on 02/10/2023  10:12:30 -------------------------------------------------------------------------------- Progress Note Details Patient Name: Date of Service: [name], [name] 02/10/2023 10:00 A M Medical Record Number: [id number] Patient Account Number: [id number] Date of Birth/Sex: Treating RN: 07/19/1970 (52 y.o. F) Primary Care Provider: Julianne [name] Other Clinician: Referring Provider: Treating Provider/Extender: [name] in Treatment: 73 Subjective Chief Complaint Information obtained from Patient the patient is here for evaluation of her bilateral lower extremity sickle cell ulcers 04/17/2021; patient comes in for substantial wounds on the right and left lower leg History of Present Illness (HPI) The following HPI elements were documented for the patient's wound: Location: medial and lateral ankle region on the right and left medial malleolus Quality: Patient reports experiencing a shooting pain to affected area(s). Severity: Patient states wound(s) are getting worse. Duration: right lower extremity bimalleolar ulcers have been present for approximately 2 years; the right medial malleolus ulcer has been there proximally 6 months Timing: Pain in wound is constant (hurts all the time) Context: The wound would happen gradually Associated Signs and Symptoms: Patient reports having increase discharge. [name], [name] ([id number]) [id number] Page 30 of 23 52 year old patient with a history of sickle cell anemia who was last seen by me with ulceration of the right lower extremity above the ankle and was referred to Dr. Marzetta Board for a surgical debridement as I was unable to do anything in the office due to excruciating pain. At that stage she was referred from the plastic surgery service to dermatology who treated her for a skin infection with doxycycline and then Levaquin and a local antibiotic ointment. I understand the patient has since  developed ulceration on the left ankle both medial and lateral and was now referred back to the wound center as dermatology has finished the management. I do not have any notes from the dermatology department Old notes: 52 year old patient with a history of sickle cell anemia, pain bilateral lower extremities, right lower extremity ulcer and has a history of receiving a skin graft( Theraskin) several months ago. She has been visiting the wound center [facility] and was seen by Dr. Leanord Hawking and Dr. Marzetta Board. after prolonged conservator therapy between July 2016 and January 2017. She had been seen by the plastic surgeon and taken to the OR for debridement and application of Theraskin. She had 3 applications of Theraskin and was then treated with collagen.  Discharge Instructions: Apply nickel thick amount to wound bed as instructed Secondary Dressing: ABD Pad, 5x9 1 x Per Week/30 Days Discharge Instructions: Apply over primary dressing as directed. Secondary Dressing: Woven Gauze Sponge, Non-Sterile 4x4 in 1 x Per Week/30 Days Discharge Instructions: Moisten with normal saline and Apply over primary dressing as directed. Secured With: Elastic Bandage 4 inch (ACE bandage) 1 x Per Week/30 Days Discharge Instructions: Secure with ACE bandage as directed. Secured With: [employer], 4.5x3.1 (in/yd) 1 x Per Week/30 Days Discharge Instructions: Secure with Kerlix as directed. WOUND #21: - Ankle Wound Laterality: Right, Medial Cleanser: Soap and Water 1 x Per Week/30 Days Discharge Instructions: May shower and wash wound with dial antibacterial soap and water prior to dressing change. Cleanser: Wound Cleanser 1 x Per Week/30 Days Discharge Instructions: Cleanse the wound with wound cleanser prior to applying a clean dressing using gauze sponges, not tissue or cotton balls. Peri-Wound Care: Triamcinolone 15 (g) 1 x Per Week/30 Days Discharge Instructions: Use triamcinolone 15 (g) as directed Peri-Wound Care: Sween Lotion (Moisturizing lotion) 1 x Per Week/30 Days Discharge Instructions: Apply moisturizing lotion as directed Prim Dressing: Santyl Ointment 1 x Per Week/30 Days ary Discharge Instructions: Apply nickel thick amount to wound bed as  instructed Secondary Dressing: ABD Pad, 5x9 1 x Per Week/30 Days Discharge Instructions: Apply over primary dressing as directed. Secondary Dressing: Woven Gauze Sponge, Non-Sterile 4x4 in 1 x Per Week/30 Days Discharge Instructions: Moisten with normal saline and Apply over primary dressing as directed. Secured With: Elastic Bandage 4 inch (ACE bandage) 1 x Per Week/30 Days Discharge Instructions: Secure with ACE bandage as directed. Secured With: [employer], 4.5x3.1 (in/yd) 1 x Per Week/30 Days Discharge Instructions: Secure with Kerlix as directed. 02/07/2023: Her wounds have gotten larger and more painful. She completed her course of antibiotics yesterday. She still has a fair amount of drainage coming from her wounds. I was only able to debride about 50% of each of her wounds, removing slough with a curette. She was unable to tolerate further debridement secondary to pain. I did take another culture due to the fact that her wounds got larger while on Augmentin (which was prescribed based on prior culture data). For now, we will continue Santyl with Kerlix and Ace bandaging. Follow-up in 2 [name]. Electronic Signature(s) Signed: 02/12/2023 10:24:27 AM By: [name] Guess MD FACS Signed: 02/12/2023 4:28:05 PM By: [name] Previous Signature: 02/10/2023 10:16:47 AM Version By: [name] Guess MD FACS Entered By: [name] on 02/12/2023 09:48:59 [name] ([id number]) [id number] Page 16 of 18 -------------------------------------------------------------------------------- HxROS Details Patient Name: Date of Service: [name] 02/10/2023 10:00 A M Medical Record Number: [id number] Patient Account Number: [id number] Date of Birth/Sex: Treating RN: 1971-02-22 (52 y.o. F) Primary Care Provider: Julianne [name] Other Clinician: Referring Provider: Treating Provider/Extender: [name] in  Treatment: 47 Information Obtained From Patient Constitutional Symptoms (General Health) Medical History: Past Medical History Notes: H/O miscarriage Eyes Medical History: Negative for: Cataracts; Glaucoma; Optic Neuritis Ear/Nose/Mouth/Throat Medical History: Negative for: Chronic sinus problems/congestion; Middle ear problems Hematologic/Lymphatic Medical History: Positive for: Anemia; Sickle Cell Disease Negative for: Hemophilia; Human Immunodeficiency Virus; Lymphedema Respiratory Medical History: Negative for: Aspiration; Asthma; Chronic Obstructive Pulmonary Disease (COPD); Pneumothorax; Sleep Apnea; Tuberculosis Cardiovascular Medical History: Negative for: Angina; Arrhythmia; Congestive Heart Failure; Coronary Artery Disease; Deep Vein Thrombosis; Hypertension; Hypotension; Myocardial Infarction; Peripheral Arterial Disease; Peripheral Venous Disease; Phlebitis; Vasculitis Past Medical History Notes: bradycardia Gastrointestinal Medical History: Negative  [name], [name] ([id number]) [id number] Page 1 of 18 Visit Report for 02/10/2023 Chief Complaint Document Details Patient Name: Date of Service: [name] 02/10/2023 10:00 A M Medical Record Number: [id number] Patient Account Number: [id number] Date of Birth/Sex: Treating RN: 08/08/1970 (52 y.o. 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 [name] Primary Care Provider: Julianne [name] Other Clinician: Referring Provider: Treating Provider/Extender: [name] in Treatment: 94 Debridement Performed for Assessment: Wound #21 Right,Medial Ankle Performed By: Physician [name] Guess, MD The following information was scribed by: [name] The information was scribed for: [name] Debridement Type: Debridement Severity of Tissue Pre Debridement: Fat layer exposed Level of Consciousness (Pre-procedure): Awake and Alert Pre-procedure Verification/Time Out Yes - 10:06 Taken: Start Time: 10:06 Pain Control: Lidocaine 5% topical ointment Percent of Wound Bed Debrided: 50% T Area Debrided (cm): otal 20.31 Tissue and other material  debrided: Non-Viable, Slough, Slough Level: Non-Viable Tissue Debridement Description: Selective/Open Wound Instrument: Curette Bleeding: Minimum Hemostasis Achieved: Pressure Response to Treatment: Procedure was tolerated well Level of Consciousness (Post- Awake and Alert procedure): Post Debridement Measurements of Total Wound Length: (cm) 11.5 Width: (cm) 4.5 Depth: (cm) 0.1 Volume: (cm) 4.064 [name], [name] ([id number]) [id number] Page 2 of 18 Character of Wound/Ulcer Post Debridement: Requires Further Debridement Severity of Tissue Post Debridement: Fat layer exposed Post Procedure Diagnosis Same as Pre-procedure Electronic Signature(s) Signed: 02/10/2023 10:32:01 AM By: [name] Guess MD FACS Signed: 02/10/2023 4:30:21 PM By: [name] Entered By: [name] on 02/10/2023 10:06:35 -------------------------------------------------------------------------------- Debridement Details Patient Name: Date of Service: [name], [name] 02/10/2023 10:00 A M Medical Record Number: [id number] Patient Account Number: [id number] Date of Birth/Sex: Treating RN: August 11, 1970 (52 y.o. [name] Primary Care Provider: Julianne [name] Other Clinician: Referring Provider: Treating Provider/Extender: [name] in Treatment: 94 Debridement Performed for Assessment: Wound #17 Right,Lateral Lower Leg Performed By: Physician [name] Guess, MD The following information was scribed by: [name] The information was scribed for: [name] Debridement Type: Debridement Level of Consciousness (Pre-procedure): Awake and Alert Pre-procedure Verification/Time Out Yes - 10:06 Taken: Start Time: 10:06 Pain Control: Lidocaine 5% topical ointment Percent of Wound Bed Debrided: 50% T Area Debrided (cm): otal 28.26 Tissue and other material debrided: Non-Viable, Slough, Slough Level: Non-Viable  Tissue Debridement Description: Selective/Open Wound Instrument: Curette Specimen: Tissue Culture Number of Specimens T aken: 1 Bleeding: Minimum Hemostasis Achieved: Pressure Response to Treatment: Procedure was tolerated well Level of Consciousness (Post- Awake and Alert procedure): Post Debridement Measurements of Total Wound Length: (cm) 9 Width: (cm) 8 Depth: (cm) 0.1 Volume: (cm) 5.655 Character of Wound/Ulcer Post Debridement: Requires Further Debridement Post Procedure Diagnosis Same as Pre-procedure Electronic Signature(s) Signed: 02/10/2023 10:32:01 AM By: [name] Guess MD FACS Signed: 02/10/2023 4:30:21 PM By: [name] Entered By: [name] on 02/10/2023 10:09:15 [name] ([id number]) [id number] Page 3 of 18 -------------------------------------------------------------------------------- HPI Details Patient Name: Date  [name] Dike MD FACS Signed: 02/12/2023 4:28:05 PM By: [name] Previous Signature: 02/10/2023 10:32:01 AM Version By: [name] Guess MD FACS Entered By: [name] on 02/12/2023 09:48:25 -------------------------------------------------------------------------------- Problem List Details Patient Name: Date of Service: [name], [name] 02/10/2023 10:00 A Stana Bunting ([id number]) [id number] Page 9 of 18 Medical Record Number: [id number] Patient Account Number: [id number] Date of Birth/Sex: Treating RN: 1971-05-04 (52 y.o. 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/17/2021 No Yes severity 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 04/17/2021 No Yes Inactive Problems ICD-10 Code Description Active Date Inactive Date L97.828 Non-pressure chronic ulcer of other part of left lower leg with other specified severity 04/17/2021 04/17/2021 Resolved Problems ICD-10 Code Description Active Date Resolved Date L97.822 Non-pressure chronic ulcer of other part of left lower leg with fat layer exposed 08/09/2022 08/09/2022 Electronic Signature(s) Signed: 02/10/2023 10:12:30 AM By: [name] Guess MD FACS Entered By: [name] on 02/10/2023  10:12:30 -------------------------------------------------------------------------------- Progress Note Details Patient Name: Date of Service: [name], [name] 02/10/2023 10:00 A M Medical Record Number: [id number] Patient Account Number: [id number] Date of Birth/Sex: Treating RN: 07/19/1970 (52 y.o. F) Primary Care Provider: Julianne [name] Other Clinician: Referring Provider: Treating Provider/Extender: [name] in Treatment: 73 Subjective Chief Complaint Information obtained from Patient the patient is here for evaluation of her bilateral lower extremity sickle cell ulcers 04/17/2021; patient comes in for substantial wounds on the right and left lower leg History of Present Illness (HPI) The following HPI elements were documented for the patient's wound: Location: medial and lateral ankle region on the right and left medial malleolus Quality: Patient reports experiencing a shooting pain to affected area(s). Severity: Patient states wound(s) are getting worse. Duration: right lower extremity bimalleolar ulcers have been present for approximately 2 years; the right medial malleolus ulcer has been there proximally 6 months Timing: Pain in wound is constant (hurts all the time) Context: The wound would happen gradually Associated Signs and Symptoms: Patient reports having increase discharge. [name], [name] ([id number]) [id number] Page 30 of 23 52 year old patient with a history of sickle cell anemia who was last seen by me with ulceration of the right lower extremity above the ankle and was referred to Dr. Marzetta Board for a surgical debridement as I was unable to do anything in the office due to excruciating pain. At that stage she was referred from the plastic surgery service to dermatology who treated her for a skin infection with doxycycline and then Levaquin and a local antibiotic ointment. I understand the patient has since  developed ulceration on the left ankle both medial and lateral and was now referred back to the wound center as dermatology has finished the management. I do not have any notes from the dermatology department Old notes: 52 year old patient with a history of sickle cell anemia, pain bilateral lower extremities, right lower extremity ulcer and has a history of receiving a skin graft( Theraskin) several months ago. She has been visiting the wound center [facility] and was seen by Dr. Leanord Hawking and Dr. Marzetta Board. after prolonged conservator therapy between July 2016 and January 2017. She had been seen by the plastic surgeon and taken to the OR for debridement and application of Theraskin. She had 3 applications of Theraskin and was then treated with collagen.  Discharge Instructions: Apply nickel thick amount to wound bed as instructed Secondary Dressing: ABD Pad, 5x9 1 x Per Week/30 Days Discharge Instructions: Apply over primary dressing as directed. Secondary Dressing: Woven Gauze Sponge, Non-Sterile 4x4 in 1 x Per Week/30 Days Discharge Instructions: Moisten with normal saline and Apply over primary dressing as directed. Secured With: Elastic Bandage 4 inch (ACE bandage) 1 x Per Week/30 Days Discharge Instructions: Secure with ACE bandage as directed. Secured With: [employer], 4.5x3.1 (in/yd) 1 x Per Week/30 Days Discharge Instructions: Secure with Kerlix as directed. WOUND #21: - Ankle Wound Laterality: Right, Medial Cleanser: Soap and Water 1 x Per Week/30 Days Discharge Instructions: May shower and wash wound with dial antibacterial soap and water prior to dressing change. Cleanser: Wound Cleanser 1 x Per Week/30 Days Discharge Instructions: Cleanse the wound with wound cleanser prior to applying a clean dressing using gauze sponges, not tissue or cotton balls. Peri-Wound Care: Triamcinolone 15 (g) 1 x Per Week/30 Days Discharge Instructions: Use triamcinolone 15 (g) as directed Peri-Wound Care: Sween Lotion (Moisturizing lotion) 1 x Per Week/30 Days Discharge Instructions: Apply moisturizing lotion as directed Prim Dressing: Santyl Ointment 1 x Per Week/30 Days ary Discharge Instructions: Apply nickel thick amount to wound bed as  instructed Secondary Dressing: ABD Pad, 5x9 1 x Per Week/30 Days Discharge Instructions: Apply over primary dressing as directed. Secondary Dressing: Woven Gauze Sponge, Non-Sterile 4x4 in 1 x Per Week/30 Days Discharge Instructions: Moisten with normal saline and Apply over primary dressing as directed. Secured With: Elastic Bandage 4 inch (ACE bandage) 1 x Per Week/30 Days Discharge Instructions: Secure with ACE bandage as directed. Secured With: [employer], 4.5x3.1 (in/yd) 1 x Per Week/30 Days Discharge Instructions: Secure with Kerlix as directed. 02/07/2023: Her wounds have gotten larger and more painful. She completed her course of antibiotics yesterday. She still has a fair amount of drainage coming from her wounds. I was only able to debride about 50% of each of her wounds, removing slough with a curette. She was unable to tolerate further debridement secondary to pain. I did take another culture due to the fact that her wounds got larger while on Augmentin (which was prescribed based on prior culture data). For now, we will continue Santyl with Kerlix and Ace bandaging. Follow-up in 2 [name]. Electronic Signature(s) Signed: 02/12/2023 10:24:27 AM By: [name] Guess MD FACS Signed: 02/12/2023 4:28:05 PM By: [name] Previous Signature: 02/10/2023 10:16:47 AM Version By: [name] Guess MD FACS Entered By: [name] on 02/12/2023 09:48:59 [name] ([id number]) [id number] Page 16 of 18 -------------------------------------------------------------------------------- HxROS Details Patient Name: Date of Service: [name] 02/10/2023 10:00 A M Medical Record Number: [id number] Patient Account Number: [id number] Date of Birth/Sex: Treating RN: 1971-02-22 (52 y.o. F) Primary Care Provider: Julianne [name] Other Clinician: Referring Provider: Treating Provider/Extender: [name] in  Treatment: 47 Information Obtained From Patient Constitutional Symptoms (General Health) Medical History: Past Medical History Notes: H/O miscarriage Eyes Medical History: Negative for: Cataracts; Glaucoma; Optic Neuritis Ear/Nose/Mouth/Throat Medical History: Negative for: Chronic sinus problems/congestion; Middle ear problems Hematologic/Lymphatic Medical History: Positive for: Anemia; Sickle Cell Disease Negative for: Hemophilia; Human Immunodeficiency Virus; Lymphedema Respiratory Medical History: Negative for: Aspiration; Asthma; Chronic Obstructive Pulmonary Disease (COPD); Pneumothorax; Sleep Apnea; Tuberculosis Cardiovascular Medical History: Negative for: Angina; Arrhythmia; Congestive Heart Failure; Coronary Artery Disease; Deep Vein Thrombosis; Hypertension; Hypotension; Myocardial Infarction; Peripheral Arterial Disease; Peripheral Venous Disease; Phlebitis; Vasculitis Past Medical History Notes: bradycardia Gastrointestinal Medical History: Negative  [name], [name] ([id number]) [id number] Page 1 of 18 Visit Report for 02/10/2023 Chief Complaint Document Details Patient Name: Date of Service: [name] 02/10/2023 10:00 A M Medical Record Number: [id number] Patient Account Number: [id number] Date of Birth/Sex: Treating RN: 08/08/1970 (52 y.o. 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 [name] Primary Care Provider: Julianne [name] Other Clinician: Referring Provider: Treating Provider/Extender: [name] in Treatment: 94 Debridement Performed for Assessment: Wound #21 Right,Medial Ankle Performed By: Physician [name] Guess, MD The following information was scribed by: [name] The information was scribed for: [name] Debridement Type: Debridement Severity of Tissue Pre Debridement: Fat layer exposed Level of Consciousness (Pre-procedure): Awake and Alert Pre-procedure Verification/Time Out Yes - 10:06 Taken: Start Time: 10:06 Pain Control: Lidocaine 5% topical ointment Percent of Wound Bed Debrided: 50% T Area Debrided (cm): otal 20.31 Tissue and other material  debrided: Non-Viable, Slough, Slough Level: Non-Viable Tissue Debridement Description: Selective/Open Wound Instrument: Curette Bleeding: Minimum Hemostasis Achieved: Pressure Response to Treatment: Procedure was tolerated well Level of Consciousness (Post- Awake and Alert procedure): Post Debridement Measurements of Total Wound Length: (cm) 11.5 Width: (cm) 4.5 Depth: (cm) 0.1 Volume: (cm) 4.064 [name], [name] ([id number]) [id number] Page 2 of 18 Character of Wound/Ulcer Post Debridement: Requires Further Debridement Severity of Tissue Post Debridement: Fat layer exposed Post Procedure Diagnosis Same as Pre-procedure Electronic Signature(s) Signed: 02/10/2023 10:32:01 AM By: [name] Guess MD FACS Signed: 02/10/2023 4:30:21 PM By: [name] Entered By: [name] on 02/10/2023 10:06:35 -------------------------------------------------------------------------------- Debridement Details Patient Name: Date of Service: [name], [name] 02/10/2023 10:00 A M Medical Record Number: [id number] Patient Account Number: [id number] Date of Birth/Sex: Treating RN: August 11, 1970 (52 y.o. [name] Primary Care Provider: Julianne [name] Other Clinician: Referring Provider: Treating Provider/Extender: [name] in Treatment: 94 Debridement Performed for Assessment: Wound #17 Right,Lateral Lower Leg Performed By: Physician [name] Guess, MD The following information was scribed by: [name] The information was scribed for: [name] Debridement Type: Debridement Level of Consciousness (Pre-procedure): Awake and Alert Pre-procedure Verification/Time Out Yes - 10:06 Taken: Start Time: 10:06 Pain Control: Lidocaine 5% topical ointment Percent of Wound Bed Debrided: 50% T Area Debrided (cm): otal 28.26 Tissue and other material debrided: Non-Viable, Slough, Slough Level: Non-Viable  Tissue Debridement Description: Selective/Open Wound Instrument: Curette Specimen: Tissue Culture Number of Specimens T aken: 1 Bleeding: Minimum Hemostasis Achieved: Pressure Response to Treatment: Procedure was tolerated well Level of Consciousness (Post- Awake and Alert procedure): Post Debridement Measurements of Total Wound Length: (cm) 9 Width: (cm) 8 Depth: (cm) 0.1 Volume: (cm) 5.655 Character of Wound/Ulcer Post Debridement: Requires Further Debridement Post Procedure Diagnosis Same as Pre-procedure Electronic Signature(s) Signed: 02/10/2023 10:32:01 AM By: [name] Guess MD FACS Signed: 02/10/2023 4:30:21 PM By: [name] Entered By: [name] on 02/10/2023 10:09:15 [name] ([id number]) [id number] Page 3 of 18 -------------------------------------------------------------------------------- HPI Details Patient Name: Date  Discharge Instructions: Apply nickel thick amount to wound bed as instructed Secondary Dressing: ABD Pad, 5x9 1 x Per Week/30 Days Discharge Instructions: Apply over primary dressing as directed. Secondary Dressing: Woven Gauze Sponge, Non-Sterile 4x4 in 1 x Per Week/30 Days Discharge Instructions: Moisten with normal saline and Apply over primary dressing as directed. Secured With: Elastic Bandage 4 inch (ACE bandage) 1 x Per Week/30 Days Discharge Instructions: Secure with ACE bandage as directed. Secured With: [employer], 4.5x3.1 (in/yd) 1 x Per Week/30 Days Discharge Instructions: Secure with Kerlix as directed. WOUND #21: - Ankle Wound Laterality: Right, Medial Cleanser: Soap and Water 1 x Per Week/30 Days Discharge Instructions: May shower and wash wound with dial antibacterial soap and water prior to dressing change. Cleanser: Wound Cleanser 1 x Per Week/30 Days Discharge Instructions: Cleanse the wound with wound cleanser prior to applying a clean dressing using gauze sponges, not tissue or cotton balls. Peri-Wound Care: Triamcinolone 15 (g) 1 x Per Week/30 Days Discharge Instructions: Use triamcinolone 15 (g) as directed Peri-Wound Care: Sween Lotion (Moisturizing lotion) 1 x Per Week/30 Days Discharge Instructions: Apply moisturizing lotion as directed Prim Dressing: Santyl Ointment 1 x Per Week/30 Days ary Discharge Instructions: Apply nickel thick amount to wound bed as  instructed Secondary Dressing: ABD Pad, 5x9 1 x Per Week/30 Days Discharge Instructions: Apply over primary dressing as directed. Secondary Dressing: Woven Gauze Sponge, Non-Sterile 4x4 in 1 x Per Week/30 Days Discharge Instructions: Moisten with normal saline and Apply over primary dressing as directed. Secured With: Elastic Bandage 4 inch (ACE bandage) 1 x Per Week/30 Days Discharge Instructions: Secure with ACE bandage as directed. Secured With: [employer], 4.5x3.1 (in/yd) 1 x Per Week/30 Days Discharge Instructions: Secure with Kerlix as directed. 02/07/2023: Her wounds have gotten larger and more painful. She completed her course of antibiotics yesterday. She still has a fair amount of drainage coming from her wounds. I was only able to debride about 50% of each of her wounds, removing slough with a curette. She was unable to tolerate further debridement secondary to pain. I did take another culture due to the fact that her wounds got larger while on Augmentin (which was prescribed based on prior culture data). For now, we will continue Santyl with Kerlix and Ace bandaging. Follow-up in 2 [name]. Electronic Signature(s) Signed: 02/12/2023 10:24:27 AM By: [name] Guess MD FACS Signed: 02/12/2023 4:28:05 PM By: [name] Previous Signature: 02/10/2023 10:16:47 AM Version By: [name] Guess MD FACS Entered By: [name] on 02/12/2023 09:48:59 [name] ([id number]) [id number] Page 16 of 18 -------------------------------------------------------------------------------- HxROS Details Patient Name: Date of Service: [name] 02/10/2023 10:00 A M Medical Record Number: [id number] Patient Account Number: [id number] Date of Birth/Sex: Treating RN: 1971-02-22 (52 y.o. F) Primary Care Provider: Julianne [name] Other Clinician: Referring Provider: Treating Provider/Extender: [name] in  Treatment: 47 Information Obtained From Patient Constitutional Symptoms (General Health) Medical History: Past Medical History Notes: H/O miscarriage Eyes Medical History: Negative for: Cataracts; Glaucoma; Optic Neuritis Ear/Nose/Mouth/Throat Medical History: Negative for: Chronic sinus problems/congestion; Middle ear problems Hematologic/Lymphatic Medical History: Positive for: Anemia; Sickle Cell Disease Negative for: Hemophilia; Human Immunodeficiency Virus; Lymphedema Respiratory Medical History: Negative for: Aspiration; Asthma; Chronic Obstructive Pulmonary Disease (COPD); Pneumothorax; Sleep Apnea; Tuberculosis Cardiovascular Medical History: Negative for: Angina; Arrhythmia; Congestive Heart Failure; Coronary Artery Disease; Deep Vein Thrombosis; Hypertension; Hypotension; Myocardial Infarction; Peripheral Arterial Disease; Peripheral Venous Disease; Phlebitis; Vasculitis Past Medical History Notes: bradycardia Gastrointestinal Medical History: Negative

## 2023-02-10 NOTE — Progress Notes (Signed)
THOMESHA, RICKNER ([id number]) [id number] Page 1 of 10 Visit Report for 02/10/2023 Arrival Information Details Patient Name: Date of Service: [name] [name] 02/10/2023 10:00 A M Medical Record Number: [id number] Patient Account Number: [id number] Date of Birth/Sex: Treating RN: [birth year] (52 y.o. F) Primary Care [name]: Julianne Handler Other Clinician: Referring Jasir Rother: Treating Tonny Isensee/Extender: Koleen Nimrod Weeks in Treatment: 33 Visit Information History Since Last Visit Added or deleted any medications: No Patient Arrived: Ambulatory Any new allergies or adverse reactions: No Arrival Time: 09:37 Had a fall or experienced change in No Accompanied By: husband activities of daily living that may affect Transfer Assistance: None risk of falls: Patient Identification Verified: Yes Signs or symptoms of abuse/neglect since last visito No Secondary Verification Process Completed: Yes Hospitalized since last visit: No Patient Requires Transmission-Based Precautions: No Implantable device outside of the clinic excluding No Patient Has Alerts: No cellular tissue based products placed in the center since last visit: Pain Present Now: Yes Electronic Signature(s) Signed: 02/10/2023 10:10:27 AM By: Dayton Scrape Entered By: Dayton Scrape on 02/10/2023 06:37:35 -------------------------------------------------------------------------------- Encounter Discharge Information Details Patient Name: Date of Service: [name], [name] 02/10/2023 10:00 A M Medical Record Number: [id number] Patient Account Number: [id number] Date of Birth/Sex: Treating RN: 02/10/1971 (52 y.o. [name] Primary Care Juniper Cobey: Julianne Handler Other Clinician: Referring Jermanie Minshall: Treating Ahmani Daoud/Extender: Koleen Nimrod Weeks in Treatment: 26 Encounter Discharge Information Items Post Procedure Vitals Discharge Condition:  Stable Temperature (F): 98.3 Ambulatory Status: Cane Pulse (bpm): 83 Discharge Destination: Home Respiratory Rate (breaths/min): 16 Transportation: Private Auto Blood Pressure (mmHg): 163/76 Accompanied By: husband Schedule Follow-up Appointment: Yes Clinical Summary of Care: Patient Declined Electronic Signature(s) Signed: 02/10/2023 4:30:21 PM By: Samuella Bruin Entered By: Samuella Bruin on 02/10/2023 07:09:30 [name] ([id number]) [id number] Page 2 of 10 -------------------------------------------------------------------------------- Lower Extremity Assessment Details Patient Name: Date of Service: [name] [name] 02/10/2023 10:00 A M Medical Record Number: [id number] Patient Account Number: [id number] Date of Birth/Sex: Treating RN: 04-Mar-1971 (52 y.o. F) Primary Care Eddison Searls: Julianne Handler Other Clinician: Referring Aren Pryde: Treating Lj Miyamoto/Extender: Koleen Nimrod Weeks in Treatment: 94 Edema Assessment Assessed: [Left: No] [Right: No] Edema: [Left: N] [Right: o] Calf Left: Right: Point of Measurement: 33 cm From Medial Instep 30.2 cm Ankle Left: Right: Point of Measurement: 10 cm From Medial Instep 21 cm Vascular Assessment Pulses: Dorsalis Pedis Palpable: [Right:Yes] Electronic Signature(s) Signed: 02/10/2023 4:30:21 PM By: Samuella Bruin Entered By: Samuella Bruin on 02/10/2023 07:03:45 -------------------------------------------------------------------------------- Multi Wound Chart Details Patient Name: Date of Service: [name], [name] 02/10/2023 10:00 A M Medical Record Number: [id number] Patient Account Number: [id number] Date of Birth/Sex: Treating RN: 28-Jun-1970 (52 y.o. F) Primary Care Ayannah Faddis: Julianne Handler Other Clinician: Referring Rosario Kushner: Treating Bashar Milam/Extender: Koleen Nimrod Weeks in Treatment: 74 Vital Signs Height(in): 67 Pulse(bpm):  83 Weight(lbs): 134 Blood Pressure(mmHg): 163/76 Body Mass Index(BMI): 21 Temperature(F): 98.3 Respiratory Rate(breaths/min): 18 [17:Photos:] [N/A:N/A [id number] Page 3 of 10] Right, Lateral Lower Leg Right, Medial Ankle N/A Wound Location: Gradually Appeared Gradually Appeared N/A Wounding Event: Sickle Cell Lesion Sickle Cell Lesion N/A Primary Etiology: N/A Venous Leg Ulcer N/A Secondary Etiology: Anemia, Sickle Cell Disease, Anemia, Sickle Cell Disease, N/A Comorbid History: Neuropathy Neuropathy 10/05/2012 06/26/2021 N/A Date Acquired: 12 84 N/A Weeks of Treatment: Open Open N/A Wound Status: No No N/A Wound Recurrence: Yes No N/A Clustered Wound: 9x8x0.1 11.5x4.5x0.1 N/A Measurements L x W x D (cm) 56.549 40.644 N/A A (cm) :  Moisten with normal saline and Apply over primary dressing as directed. Secured With Elastic Bandage 4 inch (ACE bandage) Discharge Instruction: Secure with ACE bandage as directed. Kerlix Roll Sterile, 4.5x3.1 (in/yd) Discharge Instruction: Secure with Kerlix as directed. Compression Wrap Compression Stockings Add-Ons Electronic Signature(s) Signed: 02/10/2023 4:30:21 PM By: Samuella Bruin Entered By: Samuella Bruin on 02/10/2023 07:04:21 -------------------------------------------------------------------------------- Vitals Details Patient Name: Date of Service: [name], [name] 02/10/2023 10:00 A M Medical Record Number: [id number] Patient Account Number: [id number] Date of Birth/Sex: Treating RN: 07-09-1970 (52 y.o.  F) Primary Care Malone Admire: Julianne Handler Other Clinician: Referring Zailah Zagami: Treating Mala Gibbard/Extender: Koleen Nimrod Weeks in Treatment: [address] [name], [name] ([id number]) [id number] Page 10 of 10 Time Taken: 09:37 Temperature (F): 98.3 Height (in): 67 Pulse (bpm): 83 Weight (lbs): 134 Respiratory Rate (breaths/min): 18 Body Mass Index (BMI): 21 Blood Pressure (mmHg): 163/76 Reference Range: 80 - 120 mg / dl Electronic Signature(s) Signed: 02/10/2023 10:10:27 AM By: Dayton Scrape Entered By: Dayton Scrape on 02/10/2023 06:37:58  Fat Layer (Subcutaneous Tissue) Exposed: Yes Necrotic Quality: Adherent Slough Tendon Exposed: No Muscle Exposed: No Joint Exposed: No Bone Exposed: No Periwound Skin Texture Texture Color No Abnormalities Noted: No No Abnormalities Noted: No Scarring: Yes Ecchymosis: No Hemosiderin Staining: Yes Moisture No Abnormalities Noted: Yes Temperature / Pain Temperature: No Abnormality Tenderness on Palpation: Yes Treatment Notes Wound #17 (Lower Leg) Wound Laterality: Right, Lateral Cleanser Soap and Water Discharge Instruction: May shower and wash wound with dial antibacterial soap and water prior to dressing change. Wound Cleanser Discharge Instruction: Cleanse the wound with wound cleanser prior to applying a clean dressing using gauze sponges, not tissue or cotton balls. Peri-Wound Care Triamcinolone 15 (g) Discharge Instruction: Use triamcinolone 15 (g) as directed Sween Lotion (Moisturizing lotion) Discharge Instruction: Apply moisturizing lotion as directed Topical Primary Dressing Santyl Ointment Discharge Instruction: Apply nickel thick amount to wound bed as instructed Secondary Dressing ABD Pad, 5x9 Discharge Instruction: Apply over primary dressing as directed. Woven Gauze Sponge, Non-Sterile 4x4 in Discharge Instruction: Moisten with normal saline and Apply over primary dressing as directed. [name], [name] ([id number]) [id number] Page 8 of 10 Secured With Elastic Bandage 4  inch (ACE bandage) Discharge Instruction: Secure with ACE bandage as directed. Kerlix Roll Sterile, 4.5x3.1 (in/yd) Discharge Instruction: Secure with Kerlix as directed. Compression Wrap Compression Stockings Add-Ons Electronic Signature(s) Signed: 02/10/2023 4:30:21 PM By: Samuella Bruin Entered By: Samuella Bruin on 02/10/2023 07:04:03 -------------------------------------------------------------------------------- Wound Assessment Details Patient Name: Date of Service: [name], [name] 02/10/2023 10:00 A M Medical Record Number: [id number] Patient Account Number: [id number] Date of Birth/Sex: Treating RN: 1970-12-26 (52 y.o. F) Primary Care Claira Jeter: Julianne Handler Other Clinician: Referring Gay Rape: Treating Makaylia Hewett/Extender: Koleen Nimrod Weeks in Treatment: 94 Wound Status Wound Number: 21 Primary Etiology: Sickle Cell Lesion Wound Location: Right, Medial Ankle Secondary Etiology: Venous Leg Ulcer Wounding Event: Gradually Appeared Wound Status: Open Date Acquired: 06/26/2021 Comorbid History: Anemia, Sickle Cell Disease, Neuropathy Weeks Of Treatment: 84 Clustered Wound: No Photos Wound Measurements Length: (cm) 11.5 Width: (cm) 4.5 Depth: (cm) 0.1 Area: (cm) 40.644 Volume: (cm) 4.064 % Reduction in Area: -40.2% % Reduction in Volume: -40.2% Epithelialization: Small (1-33%) Tunneling: No Undermining: No Wound Description Classification: Full Thickness Without Exposed Support Structures Wound Margin: Distinct, outline attached Exudate Amount: Medium Exudate Type: Serosanguineous Exudate Color: red, brown Foul Odor After Cleansing: No Slough/Fibrino Yes Wound Bed Granulation Amount: Small (1-33%) Exposed Structure Granulation Quality: Pink Fascia Exposed: No Necrotic Amount: Large (67-100%) Fat Layer (Subcutaneous Tissue) Exposed: Yes Necrotic Quality: Adherent Slough Tendon Exposed: No [place], [name] ([id number])  [id number] Page 9 of 10 Muscle Exposed: No Joint Exposed: No Bone Exposed: No Periwound Skin Texture Texture Color No Abnormalities Noted: No No Abnormalities Noted: Yes Scarring: Yes Temperature / Pain Temperature: No Abnormality Moisture No Abnormalities Noted: Yes Tenderness on Palpation: Yes Treatment Notes Wound #21 (Ankle) Wound Laterality: Right, Medial Cleanser Soap and Water Discharge Instruction: May shower and wash wound with dial antibacterial soap and water prior to dressing change. Wound Cleanser Discharge Instruction: Cleanse the wound with wound cleanser prior to applying a clean dressing using gauze sponges, not tissue or cotton balls. Peri-Wound Care Triamcinolone 15 (g) Discharge Instruction: Use triamcinolone 15 (g) as directed Sween Lotion (Moisturizing lotion) Discharge Instruction: Apply moisturizing lotion as directed Topical Primary Dressing Santyl Ointment Discharge Instruction: Apply nickel thick amount to wound bed as instructed Secondary Dressing ABD Pad, 5x9 Discharge Instruction: Apply over primary dressing as directed. Woven Gauze Sponge, Non-Sterile 4x4 in Discharge Instruction:  Moisten with normal saline and Apply over primary dressing as directed. Secured With Elastic Bandage 4 inch (ACE bandage) Discharge Instruction: Secure with ACE bandage as directed. Kerlix Roll Sterile, 4.5x3.1 (in/yd) Discharge Instruction: Secure with Kerlix as directed. Compression Wrap Compression Stockings Add-Ons Electronic Signature(s) Signed: 02/10/2023 4:30:21 PM By: Samuella Bruin Entered By: Samuella Bruin on 02/10/2023 07:04:21 -------------------------------------------------------------------------------- Vitals Details Patient Name: Date of Service: [name], [name] 02/10/2023 10:00 A M Medical Record Number: [id number] Patient Account Number: [id number] Date of Birth/Sex: Treating RN: 07-09-1970 (52 y.o.  F) Primary Care Malone Admire: Julianne Handler Other Clinician: Referring Zailah Zagami: Treating Mala Gibbard/Extender: Koleen Nimrod Weeks in Treatment: [address] [name], [name] ([id number]) [id number] Page 10 of 10 Time Taken: 09:37 Temperature (F): 98.3 Height (in): 67 Pulse (bpm): 83 Weight (lbs): 134 Respiratory Rate (breaths/min): 18 Body Mass Index (BMI): 21 Blood Pressure (mmHg): 163/76 Reference Range: 80 - 120 mg / dl Electronic Signature(s) Signed: 02/10/2023 10:10:27 AM By: Dayton Scrape Entered By: Dayton Scrape on 02/10/2023 06:37:58  Fat Layer (Subcutaneous Tissue) Exposed: Yes Necrotic Quality: Adherent Slough Tendon Exposed: No Muscle Exposed: No Joint Exposed: No Bone Exposed: No Periwound Skin Texture Texture Color No Abnormalities Noted: No No Abnormalities Noted: No Scarring: Yes Ecchymosis: No Hemosiderin Staining: Yes Moisture No Abnormalities Noted: Yes Temperature / Pain Temperature: No Abnormality Tenderness on Palpation: Yes Treatment Notes Wound #17 (Lower Leg) Wound Laterality: Right, Lateral Cleanser Soap and Water Discharge Instruction: May shower and wash wound with dial antibacterial soap and water prior to dressing change. Wound Cleanser Discharge Instruction: Cleanse the wound with wound cleanser prior to applying a clean dressing using gauze sponges, not tissue or cotton balls. Peri-Wound Care Triamcinolone 15 (g) Discharge Instruction: Use triamcinolone 15 (g) as directed Sween Lotion (Moisturizing lotion) Discharge Instruction: Apply moisturizing lotion as directed Topical Primary Dressing Santyl Ointment Discharge Instruction: Apply nickel thick amount to wound bed as instructed Secondary Dressing ABD Pad, 5x9 Discharge Instruction: Apply over primary dressing as directed. Woven Gauze Sponge, Non-Sterile 4x4 in Discharge Instruction: Moisten with normal saline and Apply over primary dressing as directed. [name], [name] ([id number]) [id number] Page 8 of 10 Secured With Elastic Bandage 4  inch (ACE bandage) Discharge Instruction: Secure with ACE bandage as directed. Kerlix Roll Sterile, 4.5x3.1 (in/yd) Discharge Instruction: Secure with Kerlix as directed. Compression Wrap Compression Stockings Add-Ons Electronic Signature(s) Signed: 02/10/2023 4:30:21 PM By: Samuella Bruin Entered By: Samuella Bruin on 02/10/2023 07:04:03 -------------------------------------------------------------------------------- Wound Assessment Details Patient Name: Date of Service: [name], [name] 02/10/2023 10:00 A M Medical Record Number: [id number] Patient Account Number: [id number] Date of Birth/Sex: Treating RN: 1970-12-26 (52 y.o. F) Primary Care Claira Jeter: Julianne Handler Other Clinician: Referring Gay Rape: Treating Makaylia Hewett/Extender: Koleen Nimrod Weeks in Treatment: 94 Wound Status Wound Number: 21 Primary Etiology: Sickle Cell Lesion Wound Location: Right, Medial Ankle Secondary Etiology: Venous Leg Ulcer Wounding Event: Gradually Appeared Wound Status: Open Date Acquired: 06/26/2021 Comorbid History: Anemia, Sickle Cell Disease, Neuropathy Weeks Of Treatment: 84 Clustered Wound: No Photos Wound Measurements Length: (cm) 11.5 Width: (cm) 4.5 Depth: (cm) 0.1 Area: (cm) 40.644 Volume: (cm) 4.064 % Reduction in Area: -40.2% % Reduction in Volume: -40.2% Epithelialization: Small (1-33%) Tunneling: No Undermining: No Wound Description Classification: Full Thickness Without Exposed Support Structures Wound Margin: Distinct, outline attached Exudate Amount: Medium Exudate Type: Serosanguineous Exudate Color: red, brown Foul Odor After Cleansing: No Slough/Fibrino Yes Wound Bed Granulation Amount: Small (1-33%) Exposed Structure Granulation Quality: Pink Fascia Exposed: No Necrotic Amount: Large (67-100%) Fat Layer (Subcutaneous Tissue) Exposed: Yes Necrotic Quality: Adherent Slough Tendon Exposed: No [place], [name] ([id number])  [id number] Page 9 of 10 Muscle Exposed: No Joint Exposed: No Bone Exposed: No Periwound Skin Texture Texture Color No Abnormalities Noted: No No Abnormalities Noted: Yes Scarring: Yes Temperature / Pain Temperature: No Abnormality Moisture No Abnormalities Noted: Yes Tenderness on Palpation: Yes Treatment Notes Wound #21 (Ankle) Wound Laterality: Right, Medial Cleanser Soap and Water Discharge Instruction: May shower and wash wound with dial antibacterial soap and water prior to dressing change. Wound Cleanser Discharge Instruction: Cleanse the wound with wound cleanser prior to applying a clean dressing using gauze sponges, not tissue or cotton balls. Peri-Wound Care Triamcinolone 15 (g) Discharge Instruction: Use triamcinolone 15 (g) as directed Sween Lotion (Moisturizing lotion) Discharge Instruction: Apply moisturizing lotion as directed Topical Primary Dressing Santyl Ointment Discharge Instruction: Apply nickel thick amount to wound bed as instructed Secondary Dressing ABD Pad, 5x9 Discharge Instruction: Apply over primary dressing as directed. Woven Gauze Sponge, Non-Sterile 4x4 in Discharge Instruction:

## 2023-02-14 ENCOUNTER — Telehealth: Payer: Self-pay | Admitting: Family Medicine

## 2023-02-14 NOTE — Telephone Encounter (Signed)
LVM for patient to return call to speak about the  withdrawal of Oxbryta from the market. Advised pt to call back.  This is the information to give pt when she calls if she is taking [place]  Dear Patients,  Voxelotor/Oxbryta has been removed from the market. We are shocked and saddened too and will let you know as we learn more about this. Please do not stop your medication right away, we would like you to taper this over 5-7 days, and we will recheck your blood work and see you soon!   Please expect a call from [place] to discuss other available options for managing sickle cell anemia.   [facility]

## 2023-02-17 ENCOUNTER — Telehealth: Payer: Self-pay | Admitting: Family Medicine

## 2023-02-17 ENCOUNTER — Other Ambulatory Visit: Payer: Self-pay | Admitting: Family Medicine

## 2023-02-17 DIAGNOSIS — G894 Chronic pain syndrome: Secondary | ICD-10-CM

## 2023-02-17 MED ORDER — OXYCODONE HCL 10 MG PO TABS
10.0000 mg | ORAL_TABLET | ORAL | 0 refills | Status: DC
Start: 2023-02-17 — End: 2023-03-17

## 2023-02-17 NOTE — Telephone Encounter (Addendum)
Caller & Relationship to patient:  MRN #  [id number]   Call Back Number:   Date of Last Office Visit: 02/14/2023     Date of Next Office Visit: 04/01/2023    Medication(s) to be Refilled: Oxycodone   Preferred Pharmacy:   ** Please notify patient to allow 48-72 hours to process** **Let patient know to contact pharmacy at the end of the day to make sure medication is ready. ** **If patient has not been seen in a year or longer, book an appointment **Advise to use MyChart for refill requests OR to contact their pharmacy

## 2023-02-18 ENCOUNTER — Other Ambulatory Visit: Payer: Self-pay | Admitting: Family Medicine

## 2023-02-18 DIAGNOSIS — G894 Chronic pain syndrome: Secondary | ICD-10-CM

## 2023-02-24 ENCOUNTER — Encounter (HOSPITAL_BASED_OUTPATIENT_CLINIC_OR_DEPARTMENT_OTHER): Payer: Medicaid Other | Attending: General Surgery | Admitting: General Surgery

## 2023-02-24 DIAGNOSIS — D571 Sickle-cell disease without crisis: Secondary | ICD-10-CM | POA: Insufficient documentation

## 2023-02-24 DIAGNOSIS — L97818 Non-pressure chronic ulcer of other part of right lower leg with other specified severity: Secondary | ICD-10-CM | POA: Diagnosis present

## 2023-02-24 NOTE — Progress Notes (Addendum)
KALENE, CUTLER ([id number]) [id number] Page 1 of 9 Visit Report for 02/24/2023 Arrival Information Details Patient Name: Date of Service: [name] [name] 02/24/2023 10:00 A M Medical Record Number: [id number] Patient Account Number: [id number] Date of Birth/Sex: Treating RN: 02/07/1971 (52 y.o. F) Primary Care Ellary Casamento: Julianne Handler Other Clinician: Referring Latoya Diskin: Treating Yannis Gumbs/Extender: Koleen Nimrod Weeks in Treatment: 96 Visit Information History Since Last Visit Added or deleted any medications: No Patient Arrived: Ambulatory Any new allergies or adverse reactions: No Arrival Time: 09:33 Had a fall or experienced change in No Accompanied By: self activities of daily living that may affect Transfer Assistance: None risk of falls: Patient Identification Verified: Yes Signs or symptoms of abuse/neglect since last visito No Secondary Verification Process Completed: Yes Hospitalized since last visit: No Patient Requires Transmission-Based Precautions: No Implantable device outside of the clinic excluding No Patient Has Alerts: No cellular tissue based products placed in the center since last visit: Pain Present Now: Yes Electronic Signature(s) Signed: 02/24/2023 9:35:04 AM By: Dayton Scrape Entered By: Dayton Scrape on 02/24/2023 06:34:02 -------------------------------------------------------------------------------- Encounter Discharge Information Details Patient Name: Date of Service: [name], [name] 02/24/2023 10:00 A M Medical Record Number: [id number] Patient Account Number: [id number] Date of Birth/Sex: Treating RN: 30-Dec-1970 (52 y.o. [name] Primary Care Kenzleigh Sedam: Julianne Handler Other Clinician: Referring Brinda Focht: Treating Maleny Candy/Extender: Koleen Nimrod Weeks in Treatment: 107 Encounter Discharge Information Items Post Procedure Vitals Discharge Condition:  Stable Temperature (F): 98.1 Ambulatory Status: Cane Pulse (bpm): 84 Discharge Destination: Home Respiratory Rate (breaths/min): 18 Transportation: Private Auto Blood Pressure (mmHg): 154/66 Accompanied By: self Schedule Follow-up Appointment: Yes Clinical Summary of Care: Patient Declined Electronic Signature(s) Signed: 02/24/2023 4:26:21 PM By: Samuella Bruin Entered By: Samuella Bruin on 02/24/2023 64:40:34 VQQVZDGL, OVFIE ([id number]) [id number] Page 2 of 9 -------------------------------------------------------------------------------- Lower Extremity Assessment Details Patient Name: Date of Service: [name] [name] 02/24/2023 10:00 A M Medical Record Number: [id number] Patient Account Number: [id number] Date of Birth/Sex: Treating RN: December 31, 1970 (52 y.o. [name] Primary Care Mycah Mcdougall: Julianne Handler Other Clinician: Referring Elmyra Banwart: Treating Braelyn Bordonaro/Extender: Koleen Nimrod Weeks in Treatment: 96 Edema Assessment Assessed: [Left: No] [Right: No] Edema: [Left: N] [Right: o] Calf Left: Right: Point of Measurement: 33 cm From Medial Instep 30.2 cm Ankle Left: Right: Point of Measurement: 10 cm From Medial Instep 21 cm Vascular Assessment Pulses: Dorsalis Pedis Palpable: [Right:Yes] Extremity colors, hair growth, and conditions: Extremity Color: [Right:Normal] Hair Growth on Extremity: [Right:No] Temperature of Extremity: [Right:Warm] Capillary Refill: [Right:< 3 seconds] Dependent Rubor: [Right:No] Blanched when Elevated: [Right:No No] Electronic Signature(s) Signed: 02/24/2023 4:26:21 PM By: Samuella Bruin Entered By: Samuella Bruin on 02/24/2023 06:40:37 -------------------------------------------------------------------------------- Multi Wound Chart Details Patient Name: Date of Service: [name], [name] 02/24/2023 10:00 A M Medical Record Number: [id number] Patient Account  Number: [id number] Date of Birth/Sex: Treating RN: Oct 02, 1970 (52 y.o. F) Primary Care Oliviagrace Crisanti: Julianne Handler Other Clinician: Referring Betsie Peckman: Treating Schyler Counsell/Extender: Koleen Nimrod Weeks in Treatment: 96 Vital Signs Height(in): 67 Pulse(bpm): 84 Weight(lbs): 134 Blood Pressure(mmHg): 154/66 Body Mass Index(BMI): 21 Temperature(F): 98.1 Respiratory Rate(breaths/min): 18 [17:Photos:] Okonski, Claudina ([id number]) [17:Photos:] [21:No Photos] [id number] Page 3 of 9 N/A] Right, Lateral Lower Leg Right, Medial Ankle N/A Wound Location: Gradually Appeared Gradually Appeared N/A Wounding Event: Sickle Cell Lesion Sickle Cell Lesion N/A Primary Etiology: N/A Venous Leg Ulcer N/A Secondary Etiology: Anemia, Sickle Cell Disease, Anemia, Sickle Cell Disease, N/A Comorbid History: Neuropathy Neuropathy 10/05/2012 06/26/2021 N/A  Date Acquired: 74 86 N/A Weeks of Treatment: Open Open N/A Wound Status: No No N/A Wound Recurrence: Yes No N/A Clustered Wound: 9x7.5x0.1 12x4.9x0.1 N/A Measurements L x W x D (cm) 53.014 46.181 N/A A (cm) : rea 5.301 4.618 N/A Volume (cm) : -54.10% -59.30% N/A % Reduction in A rea: -54.10% -59.40% N/A % Reduction in Volume: Full Thickness Without Exposed Full Thickness Without Exposed N/A Classification: Support Structures Support Structures Medium Medium N/A Exudate A mount: Serosanguineous Serosanguineous N/A Exudate Type: red, brown red, brown N/A Exudate Color: Distinct, outline attached Distinct, outline attached N/A Wound Margin: Small (1-33%) Small (1-33%) N/A Granulation A mount: Pink Pink N/A Granulation Quality: Large (67-100%) Large (67-100%) N/A Necrotic A mount: Adherent Slough Eschar, Adherent Slough N/A Necrotic Tissue: Fat Layer (Subcutaneous Tissue): Yes Fat Layer (Subcutaneous Tissue): Yes N/A Exposed Structures: Fascia: No Fascia: No Tendon: No Tendon:  No Muscle: No Muscle: No Joint: No Joint: No Bone: No Bone: No Small (1-33%) Small (1-33%) N/A Epithelialization: Debridement - Selective/Open Wound Debridement - Selective/Open Wound N/A Debridement: Pre-procedure Verification/Time Out 09:56 09:56 N/A Taken: Lidocaine 4% Topical Solution Lidocaine 4% Topical Solution N/A Pain Control: [employer] N/A Tissue Debrided: Non-Viable Tissue Non-Viable Tissue N/A Level: 42.39 11.54 N/A Debridement A (sq cm): rea Curette Curette N/A Instrument: Minimum Minimum N/A Bleeding: Pressure Pressure N/A Hemostasis A chieved: Procedure was tolerated well Procedure was tolerated well N/A Debridement Treatment Response: 9x7.5x0.1 12x4.9x0.1 N/A Post Debridement Measurements L x W x D (cm) 5.301 4.618 N/A Post Debridement Volume: (cm) Scarring: Yes Scarring: Yes N/A Periwound Skin Texture: No Abnormalities Noted No Abnormalities Noted N/A Periwound Skin Moisture: Hemosiderin Staining: Yes No Abnormalities Noted N/A Periwound Skin Color: Ecchymosis: No No Abnormality No Abnormality N/A Temperature: Yes Yes N/A Tenderness on Palpation: Debridement Debridement N/A Procedures Performed: Treatment Notes Electronic Signature(s) Signed: 02/24/2023 10:14:38 AM By: Duanne Guess MD FACS Entered By: Duanne Guess on 02/24/2023 07:14:38 -------------------------------------------------------------------------------- Multi-Disciplinary Care Plan Details Patient Name: Date of Service: [name], [name] 02/24/2023 10:00 A Rejeana Brock, Felecia Shelling ([id number]) [id number] Page 4 of 9 Medical Record Number: [id number] Patient Account Number: [id number] Date of Birth/Sex: Treating RN: 11/03/1970 (52 y.o. [name] Primary Care Marylu Dudenhoeffer: Julianne Handler Other Clinician: Referring Xandria Gallaga: Treating Jaggar Benko/Extender: Koleen Nimrod Weeks in Treatment: 96 Multidisciplinary Care Plan  reviewed with physician Active Inactive Necrotic Tissue Nursing Diagnoses: Impaired tissue integrity related to necrotic/devitalized tissue Knowledge deficit related to management of necrotic/devitalized tissue Goals: Necrotic/devitalized tissue will be minimized in the wound bed Date Initiated: 12/04/2022 Target Resolution Date: 03/21/2023 Goal Status: Active Patient/caregiver will verbalize understanding of reason and process for debridement of necrotic tissue Date Initiated: 12/04/2022 Target Resolution Date: 03/21/2023 Goal Status: Active Interventions: Assess patient pain level pre-, during and post procedure and prior to discharge Provide education on necrotic tissue and debridement process Treatment Activities: Apply topical anesthetic as ordered : 12/04/2022 Enzymatic debridement : 12/04/2022 Notes: Electronic Signature(s) Signed: 02/24/2023 4:26:21 PM By: Samuella Bruin Entered By: Samuella Bruin on 02/24/2023 06:42:03 -------------------------------------------------------------------------------- Pain Assessment Details Patient Name: Date of Service: [name], [name] 02/24/2023 10:00 A M Medical Record Number: [id number] Patient Account Number: [id number] Date of Birth/Sex: Treating RN: November 05, 1970 (52 y.o. F) Primary Care Jennise Both: Julianne Handler Other Clinician: Referring Amal Renbarger: Treating Jayzon Taras/Extender: Koleen Nimrod Weeks in Treatment: 96 Active Problems Location of Pain Severity and Description of Pain Patient Has Paino Yes Site Locations Rate the pain. SHANELLE, CLONTZ ([id number]) [id number] Page 5 of 9 Rate  the pain. Current Pain Level: 8 Worst Pain Level: 10 Least Pain Level: 0 Tolerable Pain Level: 2 Pain Management and Medication Current Pain Management: Electronic Signature(s) Signed: 02/24/2023 9:35:04 AM By: Dayton Scrape Entered By: Dayton Scrape on 02/24/2023  06:34:42 -------------------------------------------------------------------------------- Patient/Caregiver Education Details Patient Name: Date of Service: [name], [name] 10/7/2024andnbsp10:00 A M Medical Record Number: [id number] Patient Account Number: [id number] Date of Birth/Gender: Treating RN: 08/27/1970 (52 y.o. [name] Primary Care Physician: Julianne Handler Other Clinician: Referring Physician: Treating Physician/Extender: Koleen Nimrod Weeks in Treatment: 56 Education Assessment Education Provided To: Patient Education Topics Provided Wound/Skin Impairment: Methods: Explain/Verbal Responses: Reinforcements needed, State content correctly Electronic Signature(s) Signed: 02/24/2023 4:26:21 PM By: Samuella Bruin Entered By: Samuella Bruin on 02/24/2023 06:42:31 -------------------------------------------------------------------------------- Wound Assessment Details Patient Name: Date of Service: [name], [name] 02/24/2023 10:00 A M Medical Record Number: [id number] Patient Account Number: [id number] Date of Birth/Sex: Treating RN: 1971/04/27 (52 y.o. F) Primary Care Arelene Moroni: Julianne Handler Other Clinician: Referring Praveen Coia: Treating Ahmere Hemenway/Extender: Koleen Nimrod [place], Felecia Shelling ([id number]) [id number] Page 6 of 9 Weeks in Treatment: 96 Wound Status Wound Number: 17 Primary Etiology: Sickle Cell Lesion Wound Location: Right, Lateral Lower Leg Wound Status: Open Wounding Event: Gradually Appeared Comorbid History: Anemia, Sickle Cell Disease, Neuropathy Date Acquired: 10/05/2012 Weeks Of Treatment: 96 Clustered Wound: Yes Photos Wound Measurements Length: (cm) 9 Width: (cm) 7.5 Depth: (cm) 0.1 Area: (cm) 53.014 Volume: (cm) 5.301 % Reduction in Area: -54.1% % Reduction in Volume: -54.1% Epithelialization: Small (1-33%) Wound Description Classification: Full  Thickness Without Exposed Support Structures Wound Margin: Distinct, outline attached Exudate Amount: Medium Exudate Type: Serosanguineous Exudate Color: red, brown Foul Odor After Cleansing: No Slough/Fibrino Yes Wound Bed Granulation Amount: Small (1-33%) Exposed Structure Granulation Quality: Pink Fascia Exposed: No Necrotic Amount: Large (67-100%) Fat Layer (Subcutaneous Tissue) Exposed: Yes Necrotic Quality: Adherent Slough Tendon Exposed: No Muscle Exposed: No Joint Exposed: No Bone Exposed: No Periwound Skin Texture Texture Color No Abnormalities Noted: No No Abnormalities Noted: No Scarring: Yes Ecchymosis: No Hemosiderin Staining: Yes Moisture No Abnormalities Noted: Yes Temperature / Pain Temperature: No Abnormality Tenderness on Palpation: Yes Treatment Notes Wound #17 (Lower Leg) Wound Laterality: Right, Lateral Cleanser Soap and Water Discharge Instruction: May shower and wash wound with dial antibacterial soap and water prior to dressing change. Wound Cleanser Discharge Instruction: Cleanse the wound with wound cleanser prior to applying a clean dressing using gauze sponges, not tissue or cotton balls. Peri-Wound Care Triamcinolone 15 (g) Discharge Instruction: Use triamcinolone 15 (g) as directed Sween Lotion (Moisturizing lotion) Discharge Instruction: Apply moisturizing lotion as directed DEAVEN, BARRON ([id number]) [id number] Page 7 of 9 Topical Primary Dressing Santyl Ointment Discharge Instruction: Apply nickel thick amount to wound bed as instructed Secondary Dressing ABD Pad, 5x9 Discharge Instruction: Apply over primary dressing as directed. Woven Gauze Sponge, Non-Sterile 4x4 in Discharge Instruction: Moisten with normal saline and Apply over primary dressing as directed. Secured With Elastic Bandage 4 inch (ACE bandage) Discharge Instruction: Secure with ACE bandage as directed. Kerlix Roll Sterile, 4.5x3.1  (in/yd) Discharge Instruction: Secure with Kerlix as directed. Compression Wrap Compression Stockings Add-Ons Electronic Signature(s) Signed: 02/24/2023 11:01:58 AM By: Dayton Scrape Entered By: Dayton Scrape on 02/24/2023 06:42:04 -------------------------------------------------------------------------------- Wound Assessment Details Patient Name: Date of Service: [name], [name] 02/24/2023 10:00 A M Medical Record Number: [id number] Patient Account Number: [id number] Date of Birth/Sex: Treating RN: 20-Nov-1970 (52 y.o. F) Primary Care Deamonte Sayegh: Julianne Handler Other Clinician: Referring  Areonna Bran: Treating Welden Hausmann/Extender: Koleen Nimrod Weeks in Treatment: 96 Wound Status Wound Number: 21 Primary Etiology: Sickle Cell Lesion Wound Location: Right, Medial Ankle Secondary Etiology: Venous Leg Ulcer Wounding Event: Gradually Appeared Wound Status: Open Date Acquired: 06/26/2021 Comorbid History: Anemia, Sickle Cell Disease, Neuropathy Weeks Of Treatment: 86 Clustered Wound: No Wound Measurements Length: (cm) 12 Width: (cm) 4.9 Depth: (cm) 0.1 Area: (cm) 46.181 Volume: (cm) 4.618 % Reduction in Area: -59.3% % Reduction in Volume: -59.4% Epithelialization: Small (1-33%) Tunneling: No Undermining: No Wound Description Classification: Full Thickness Without Exposed Support Structures Wound Margin: Distinct, outline attached Exudate Amount: Medium Exudate Type: Serosanguineous Exudate Color: red, brown Foul Odor After Cleansing: No Slough/Fibrino Yes Wound Bed Granulation Amount: Small (1-33%) Exposed Structure Granulation Quality: Pink Fascia Exposed: No Necrotic Amount: Large (67-100%) Fat Layer (Subcutaneous Tissue) Exposed: Yes Necrotic Quality: Eschar, Adherent Slough Tendon Exposed: No Muscle Exposed: No Joint Exposed: No [place]bit, Felecia Shelling ([id number]) [id number] Page 8 of 9 Bone Exposed: No Periwound Skin  Texture Texture Color No Abnormalities Noted: No No Abnormalities Noted: Yes Scarring: Yes Temperature / Pain Temperature: No Abnormality Moisture No Abnormalities Noted: Yes Tenderness on Palpation: Yes Treatment Notes Wound #21 (Ankle) Wound Laterality: Right, Medial Cleanser Soap and Water Discharge Instruction: May shower and wash wound with dial antibacterial soap and water prior to dressing change. Wound Cleanser Discharge Instruction: Cleanse the wound with wound cleanser prior to applying a clean dressing using gauze sponges, not tissue or cotton balls. Peri-Wound Care Triamcinolone 15 (g) Discharge Instruction: Use triamcinolone 15 (g) as directed Sween Lotion (Moisturizing lotion) Discharge Instruction: Apply moisturizing lotion as directed Topical Primary Dressing Santyl Ointment Discharge Instruction: Apply nickel thick amount to wound bed as instructed Secondary Dressing ABD Pad, 5x9 Discharge Instruction: Apply over primary dressing as directed. Woven Gauze Sponge, Non-Sterile 4x4 in Discharge Instruction: Moisten with normal saline and Apply over primary dressing as directed. Secured With Elastic Bandage 4 inch (ACE bandage) Discharge Instruction: Secure with ACE bandage as directed. Kerlix Roll Sterile, 4.5x3.1 (in/yd) Discharge Instruction: Secure with Kerlix as directed. Compression Wrap Compression Stockings Add-Ons Electronic Signature(s) Signed: 02/24/2023 11:01:58 AM By: Dayton Scrape Entered By: Dayton Scrape on 02/24/2023 06:42:37 -------------------------------------------------------------------------------- Vitals Details Patient Name: Date of Service: [name], [name] 02/24/2023 10:00 A M Medical Record Number: [id number] Patient Account Number: [id number] Date of Birth/Sex: Treating RN: 1970-07-21 (52 y.o. F) Primary Care Shellyann Wandrey: Julianne Handler Other Clinician: Referring Deklen Popelka: Treating Rheanne Cortopassi/Extender: Koleen Nimrod Weeks in Treatment: 96 Vital Signs Time Taken: 09:34 Temperature (F): 98.1 [place], Felecia Shelling ([id number]) [id number].pdf Page 9 of 9 Height (in): 67 Pulse (bpm): 84 Weight (lbs): 134 Respiratory Rate (breaths/min): 18 Body Mass Index (BMI): 21 Blood Pressure (mmHg): 154/66 Reference Range: 80 - 120 mg / dl Electronic Signature(s) Signed: 02/24/2023 9:35:04 AM By: Dayton Scrape Entered By: Dayton Scrape on 02/24/2023 06:34:25

## 2023-02-24 NOTE — Progress Notes (Signed)
[name], [name] ([id number]) [id number] Page 1 of 18 Visit Report for 02/24/2023 Chief Complaint Document Details Patient Name: Date of Service: [name] [name] 02/24/2023 10:00 A M Medical Record Number: [id number] Patient Account Number: [id number] Date of Birth/Sex: Treating RN: 1970/08/10 (52 y.o. F) Primary Care Provider: Julianne [name] Other Clinician: Referring Provider: Treating Provider/Extender: [name] Weeks in Treatment: 96 Information Obtained from: Patient Chief Complaint the patient is here for evaluation of her bilateral lower extremity sickle cell ulcers 04/17/2021; patient comes in for substantial wounds on the right and left lower leg Electronic Signature(s) Signed: 02/24/2023 10:15:40 AM By: [name] Guess MD FACS Entered By: [name] on 02/24/2023 07:15:40 -------------------------------------------------------------------------------- Debridement Details Patient Name: Date of Service: [name], [name] 02/24/2023 10:00 A M Medical Record Number: [id number] Patient Account Number: [id number] Date of Birth/Sex: Treating RN: 03/07/1971 (52 y.o. [name] Primary Care Provider: Julianne [name] Other Clinician: Referring Provider: Treating Provider/Extender: [name] Weeks in Treatment: 96 Debridement Performed for Assessment: Wound #21 Right,Medial Ankle Performed By: Physician [name] Guess, MD The following information was scribed by: [name] The information was scribed for: [name] Debridement Type: Debridement Severity of Tissue Pre Debridement: Fat layer exposed Level of Consciousness (Pre-procedure): Awake and Alert Pre-procedure Verification/Time Out Yes - 09:56 Taken: Start Time: 09:56 Pain Control: Lidocaine 4% T opical Solution Percent of Wound Bed Debrided: 25% T Area Debrided (cm): otal 11.54 Tissue and other material  debrided: Non-Viable, Slough, Slough Level: Non-Viable Tissue Debridement Description: Selective/Open Wound Instrument: Curette Bleeding: Minimum Hemostasis Achieved: Pressure Response to Treatment: Procedure was tolerated well Level of Consciousness (Post- Awake and Alert procedure): Post Debridement Measurements of Total Wound Length: (cm) 12 Width: (cm) 4.9 Depth: (cm) 0.1 Volume: (cm) 4.618 [name], [name] ([id number]) [id number] Page 2 of 18 Character of Wound/Ulcer Post Debridement: Improved Severity of Tissue Post Debridement: Fat layer exposed Post Procedure Diagnosis Same as Pre-procedure Electronic Signature(s) Signed: 02/24/2023 10:35:48 AM By: [name] Guess MD FACS Signed: 02/24/2023 4:26:21 PM By: [name] Entered By: [name] on 02/24/2023 07:00:59 -------------------------------------------------------------------------------- Debridement Details Patient Name: Date of Service: [name], [name] 02/24/2023 10:00 A M Medical Record Number: [id number] Patient Account Number: [id number] Date of Birth/Sex: Treating RN: [birth year] (52 y.o. [name] Primary Care Provider: Julianne [name] Other Clinician: Referring Provider: Treating Provider/Extender: [name] Weeks in Treatment: 96 Debridement Performed for Assessment: Wound #17 Right,Lateral Lower Leg Performed By: Physician [name] Guess, MD The following information was scribed by: [name] The information was scribed for: [name] Debridement Type: Debridement Level of Consciousness (Pre-procedure): Awake and Alert Pre-procedure Verification/Time Out Yes - 09:56 Taken: Start Time: 09:56 Pain Control: Lidocaine 4% T opical Solution Percent of Wound Bed Debrided: 80% T Area Debrided (cm): otal 42.39 Tissue and other material debrided: Non-Viable, Slough, Slough Level: Non-Viable Tissue Debridement  Description: Selective/Open Wound Instrument: Curette Bleeding: Minimum Hemostasis Achieved: Pressure Response to Treatment: Procedure was tolerated well Level of Consciousness (Post- Awake and Alert procedure): Post Debridement Measurements of Total Wound Length: (cm) 9 Width: (cm) 7.5 Depth: (cm) 0.1 Volume: (cm) 5.301 Character of Wound/Ulcer Post Debridement: Improved Post Procedure Diagnosis Same as Pre-procedure Electronic Signature(s) Signed: 02/24/2023 10:35:48 AM By: [name] Guess MD FACS Signed: 02/24/2023 4:26:21 PM By: [name] Entered By: [name] on 02/24/2023 07:02:01 HPI Details -------------------------------------------------------------------------------- [name] ([id number]) [id number] Page 3 of 18 Patient Name: Date of Service: [name] [name], [place] [name] 02/24/2023 10:00 A M  Medical Record Number: [id number] Patient Account Number: [id number] Date of Birth/Sex: Treating RN: 04/29/1971 (52 y.o. F) Primary Care Provider: Julianne [name] Other Clinician: Referring Provider: Treating Provider/Extender: [name] Weeks in Treatment: 96 History of Present Illness Location: medial and lateral ankle region on the right and left medial malleolus Quality: Patient reports experiencing a shooting pain to affected area(s). Severity: Patient states wound(s) are getting worse. Duration: right lower extremity bimalleolar ulcers have been present for approximately 2 years; the right medial malleolus ulcer has been there proximally 6 months Timing: Pain in wound is constant (hurts all the time) Context: The wound would happen gradually ssociated Signs and Symptoms: Patient reports having increase discharge. A HPI Description: 52 year old patient with a history of sickle cell anemia who was last seen by me with ulceration of the right lower extremity above the ankle and was referred to Dr. Marzetta [name] for a  surgical debridement as I was unable to do anything in the office due to excruciating pain. At that stage she was referred from the plastic surgery service to dermatology who treated her for a skin infection with doxycycline and then Levaquin and a local antibiotic ointment. I understand the patient has since developed ulceration on the left ankle both medial and lateral and was now referred back to the wound center as dermatology has finished the management. I do not have any notes from the dermatology department Old notes: 52 year old patient with a history of sickle cell anemia, pain bilateral lower extremities, right lower extremity ulcer and has a history of receiving a skin graft( Theraskin) several months ago. She has been visiting the wound center [facility] and was seen by Dr. Leanord Hawking and Dr. Marzetta [name]. after prolonged conservator therapy between July 2016 and January 2017. She had been seen by the plastic surgeon and taken to the OR for debridement and application of Theraskin. She had 3 applications of Theraskin and was then treated with collagen. Prior to that she had a history of similar problems in 2014 and was treated conservatively. Had a reflux study done for the right lower extremity in August 2016 without reflux or DVT . Past medical history significant for sickle cell disease, anemia, leg ulcers, cholelithiasis,and has never been a smoker. Once the patient was discharged on the wound center she says within 2 or 3 weeks the problems recurred and she has been treating it conservatively. since I saw her 3 weeks ago at [facility] she has been unable to get her dressing material but has completed a course of doxycycline. 6/7/ 2017 -- lower extremity venous duplex reflux evaluation was done No evidence of SVT or DVT in the RLL. No venous incompetence in the RLL. No further vascular workup is indicated at this time. She was seen by Dr. Mina Marble, on 10/04/2015. She agreed with the  plan of taking her to the OR for debridement and application of theraskin and would also take biopsies to rule out pyoderma gangrenosum. Follow-up note dated May 31 received and she was status post application of Theraskin to multiple ulcers around the right ankle. Pathology did not show evidence of malignancy or pyoderma gangrenosum. She would continue to see as in the wound clinic for further care and see Dr. Marzetta [name] as needed. The patient brought the biopsy report and it was consistent with stasis ulcer no evidence of malignancy and the comment was that there was some adjacent neovascularization, fibrosis and patchy perivascular chronic inflammation. 11/15/2015 -- today we applied her first application of  Theraskin 11/30/15; TheraSkin #2 12/13/2015 -- she is having a lot of pain locally and is here for possible application of a theraskin today. 01/16/2016 -- the patient has significant pain and has noticed despite in spite of all local care and oral pain medication. It is impossible to debride her in the office. 02/06/2016 -- I do not see any notes from Dr. Leta Baptist( the patient has not made a call to the office know as she heard from them) and the only visit to recently was with her PCP Dr. Gypsy Decant -- I saw her on 01/16/2016 and prescribed 90 tablets of oxycodone 10 mg and did lab work and screening for HIV. the HIV was negative and hemoglobin was 6.3 with a WBC count of 14.9 and hematocrit of 17.8 with platelets of 561. 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 The bimalleolar ulcers to the right lower extremity have been present for approximately 2 years, the left medial malleolus ulcer has been present approximately 6 months. She has followed with Dr.Thimmappa in the past and has had a total of 3 applications of Theraskin (01/2015, 09/2015, 06/17/16). She has also followed  with Dr. Meyer Russel here in the clinic and has received 2 applications of TheraSkin (11/10/15, 11/30/15). The patient does experience chronic, and is not amenable to debridement. 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 She is not currently on any antibiotic therapy and has not been treated with any recently. 07/17/2016 -- was seen by Dr. Leta Baptist of plastic surgery who saw her 2 weeks postop application of Theraskin #3. She had removed her dressing and asked her to apply silver alginate on alternate days and follow-up back with the wound center. Future debridements and application of skin substitute would have to be done in the hospital due to her high risk for anesthesia. READMISSION 04/17/2021 Patient is now a 52 year old woman that we have had in this clinic for a prolonged period of time and 2016-2017 and then again for 2 visits in February 2018. At that point she had wounds on the right lower leg predominantly medial. She had also been seen by plastic surgery Dr. Marzetta [name] who I believe took her to the OR for operative debridement and application of TheraSkin in 2017. After she left our clinic she was followed for a very prolonged period of time in the wound care center in [facility] who then referred her ultimately to [facility] where she was seen by Dr. Mardene Speak. Again taken her to the OR for skin grafting which apparently did not take. She had multiple other attempts at dressings although I have not really looked over all of these notes in great detail. She has not been seen in a wound care center in about a year. She states over the last year in addition to her right lower leg she has developed wounds on the left lower leg quite extensive. She is using Xeroform to all of these wounds without really any improvement. She also has Medicaid which does not cover wound products. The patient has had vascular work-ups in the past including most recently on 03/28/2021  showing biphasic waveforms on the right triphasic at the PTA and biphasic at the dorsalis pedis on the left. She was unable to tolerate any degree of compression to do ABIs. Unfortunately TBI's were also not done. She had venous reflux studies done in  2017. This did not show any evidence of a DVT or SVT and no venous incompetence was noted in the right leg at the time this was the only side with the wound As noted I did not look all over her old records. She apparently had a course of HBO and Baptist although I am not sure what the indication would have been. In any case she developed seizures and terminated treatment earlier. She is generally much more disabled than when we last saw her in clinic. She can no longer walk pretty much wheelchair-bound because predominantly of pain in the left hip. 04/24/2021; the patient tolerated the wraps we put on. We used Santyl and Hydrofera Blue under compression. I brought her back for a nurse visit for a change in dressing. With Medicaid we will have a hard time getting anything paid for and hence the need for compression. She arrives in clinic with all the wounds looking somewhat better in terms of surface 12/20; circumferential wound on the right from the lateral to the medial. She has open areas on the left medial and left lateral x2 on all of this with the same surface. This does not look completely healthy although she does have some epithelialization. She is not complaining of a lot of pain which is unusual for her [name], [name] ([id number]) [id number] Page 4 of 18 sickle ulcers. I have not looked over her extensive records from [facility]. She had recent arterial studies and has a history of venous reflux studies I will need to look these over although I do not believe she has significant arterial disease 2023 05/22/2021; patient's wound areas measure slightly smaller. Still a lot of drainage coming from the right we have been  using Hydrofera Blue and Santyl with some improvement in the wound surfaces. She tells me she will be getting transfused later in the week for her underlying sickle cell anemia I have looked over her recent arterial studies which were done in the fall. This was in November and showed biphasic and triphasic waveforms but she could not tolerate ABIs because of pressure and unfortunately TBI's were not done. She has not had recent venous reflux studies that I can see 1/10; not much change about the same surface area. This has a yellowish surface to it very gritty. We have been using Santyl and Hydrofera Blue for a prolonged period. Culture I did last week showed methicillin sensitive staph aureus "rare". Our intake nurse reports greenish drainage which may be the Hydrofera Blue itself 1/17; wounds are continue to measure smaller although I am not sure about the accuracy here. Especially the areas on the right are covered in what looks to be a nonviable surface although she does have some epithelialization. Similarly she has areas on the left medial and left lateral ankle area which appear to have a better surface and perhaps are slightly smaller. We have been using Santyl and Hydrofera Blue. She cannot tolerate mechanical debridements She went for her reflux studies which showed significant reflux at the greater saphenous vein at the saphenofemoral junction as well as the greater saphenous vein in the proximal calf on the left she had reflux in the thigh and the common femoral vein and supra vein Fishel vein reflux in the greater saphenous vein. I will have vein and vascular look at this. My thoughts have been that these are likely sickle wounds. I looked through her old records from [facility] wound care center and then when she graduated to [facility]  [facility] wound care center where she saw Dr. Ronda Fairly and Dr. Mardene Speak. Although I can see she had reflux studies done I do not see that she actually saw a vein  and vascular. I went over the fact that she had operative debridements and actual skin grafting that did not take. I do not think these wounds have ever really progressed towards healing 1/31;Substantial wounds on the right ankle area. Hyper granulated very gritty adherent debris on the surface. 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 On the left she has no DVT or SVT deep vein reflux in the common femoral vein. Superficial vein reflux in the greater saphenous vein on although the vein diameter is not really all that large. I do not think there is anything that can be done with these although I am going to send her for consultation to vein and vascular. 2/7; Wound exam; substantial wound area on the right posterior ankle area and areas on the left medial ankle and left lateral ankle. I was able to debride the left medial ankle last week fairly aggressively and it is back this week to a completely nonviable surface She will see vascular surgery this Friday and I would like them to review the venous studies and also any comments on her arterial status. If they do not see an issue here I am going to refer her to plastic surgery for an operative debridement perhaps intraoperative ACell or Integra. Eventually she will require a deep tissue culture again 2/14; substantial wound area on the right posterior ankle, medial ankle. We have been using silver alginate The patient was seen by vein and vascular she had both venous reflux studies and arterial studies. 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 She had no evidence of superficial venous thrombosis. She did have superficial vein reflux at the saphenofemoral junction and the greater saphenous vein. On the left no evidence of a DVT no evidence of superficial venous throat thrombosis she did have deep vein reflux in the common femoral vein and superficial vein reflux in the greater saphenous vein but these were not felt to be amenable to ablation. In terms of arterial studies she had triphasic and wife biphasic wave waveforms bilaterally not felt to have a significant arterial issue. I do not get the feeling that they felt that any part of her nonhealing wounds were related to either arterial or venous issues. They did note that she had venous reflux at the right at the [facility] and GCV. And also on the left there were reflux in the deep system at the common femoral vein and greater saphenous vein in the proximal thigh. Nothing amenable to ablation. 2/20; she is making some decent progress on the right where there is nice skin between the 2 open areas on the right ankle. The surfaces here do not look viable yet there is some surrounding epithelialization. She still has a small area on the left medial ankle area. Hyper-granulated [name] 2/28 patient has an appointment with plastic surgery on  3/8. We will see her back on 3/9. She may have to call us to get the area redressed. We've been using Santyl under silver alginate. We made a nice improvement on the left medial ankle. The larger wounds on the right also looks somewhat better in terms of epithelialization although I think they could benefit from an aggressive debridement if plastic surgery would be willing to do that. Perhaps placement of Integra or a cell 07/26/2021: She saw Dr. Arita Miss yesterday. He raised the question as to whether or not this might be pyoderma and wanted to wait until that question was answered by dermatology before proceeding with any sort  of operative debridement. We have continue to use Santyl under silver alginate with Kerlix and Coban wraps. Overall, her wounds appear to be continuing to contract and epithelialize, with some granulation tissue present. There continues to be some slough on all wound surfaces. 08/09/2021: She has not been able to get an appointment with dermatology because apparently the offices in [facility] and [place] do not accept Medicaid. She is looking into whether or not she can be seen at the main [facility] dermatology clinic. This is necessary because plastic surgery is concerned that her wounds might represent pyoderma and they did not want to do any procedure until that was clarified. We have been using Santyl under silver alginate with Kerlix and Coban wraps. Today, there was a greater amount of drainage on her dressings with a slight green discoloration and significant odor. Despite this, her wounds continue to contract and epithelialize. There is pale granulation tissue present and actually, on the left medial ankle, the granulation tissue is a bit hypertrophic. 08/16/2021: Last week, I took a culture and this grew back rare methicillin-resistant Staph aureus and rare corynebacterium. The MRSA was sensitive to gentamicin which we began applying topically on an empiric basis. This week, her wounds are a bit smaller and the drainage and odor are less. Her primary care provider is working on assisting the patient with a dermatology evaluation. She has been in silver alginate over the gentamicin that was started last week along with Kerlix and Coban wraps. 08/23/2021: Because she has Medicaid, we have been unable to get her into see any dermatologist in the [place] to rule out pyoderma gangrenosum, which was a requirement from plastic surgery prior to any sort of debridement and grafting. Despite this, however, all of her wounds continue to get smaller. The wound on her left medial ankle is nearly  closed. There is no odor from the wounds, although she still accumulates a modest amount of drainage on her dressings. 08/30/2021: The lateral right ankle wound and the medial left ankle wound are a bit smaller today. The medial right ankle wound is about the same size. They are less tender. We have still been unable to get her into dermatology. 09/06/2021: All of the wounds are about the same size today. She continues to endorse minimal pain. I communicated with Dr. Arita Miss in plastic surgery regarding our issues getting a dermatology appointment; he was out of town but indicated that he would look into perhaps performing the biopsy in his office and will have his office contact her. 09/14/2021: The patient has an appointment in dermatology, but it is not until October. Her wounds are roughly the same; she continues to have very thick [place], [name] ([id number]) [id number].pdf Page 5 of 18 purulent-looking drainage on her dressings. 09/20/2021: The left medial wound is nearly closed and just has a bit  of accumulated eschar on the surface. The right medial and lateral ankle wounds are perhaps a little bit smaller. They continue to have a very pale surface with accumulation of thin slough. 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 I did not think [name] was indicated based on this. She is getting topical mupirocin with Prisma silver collagen. 10/04/2021: The patient was not seen in clinic last week due to childcare coverage issues. In the interim, the left medial leg wound has closed. The right sided leg wounds are smaller. There is more granulation tissue coming through, particularly on the lateral wound. The surface remains somewhat gritty. We have been applying topical mupirocin and Prisma silver collagen. 10/11/2021: The left medial leg wound remains closed. She does complain of some anesthetic sensation to the area. Both of the right-sided leg wounds  are smaller but still have accumulated slough. 10/18/2021: Both right-sided leg wounds are minimally smaller this week. She still continues to accumulate slough and has thick drainage on her dressings. 10/23/2021: Both wounds continue to contract. There is still slough buildup. She has been approved for a keratin-based skin substitute trial product but it will not be available until next week. 10/30/2021: The wounds are about the same to perhaps slightly smaller. There is still continued slough buildup. Unfortunately, the rep for the keratin based product did not show up today and did not answer his phone when called. 11/08/2021: The wounds are little bit smaller today. She continues to have thick drainage but the surfaces are relatively clean with just a little bit of slough accumulation. She reported to me today that she is unable to completely flex her left ankle and on examination it seems this is potentially related to scar tissue from her wounds. We do have the ProgenaMatrix trial product available for her today. 11/15/2021: Both wounds are smaller today. There is some slough accumulation on the surfaces, but the medial wound, in particular looks like it is filling in and is less deep. She did hear from physical therapy and she is going to start working with them on July 11. She is here for her second application of the trial skin substitute, ProgenaMatrix. 11/22/2021: Both wounds continue to contract, the medial more dramatically than the lateral. Both wounds have a layer of slough on the surface, but underneath this, the gritty fibrous tissue has a little bit more of a pink cast to it rather than being as pale as it has been. 11/29/2021: The wounds are roughly the same size this week, perhaps a millimeter or 2 smaller. The medial wound has filled in and is nearly flush with the surrounding skin surface. She continues to have a lot of slough accumulation on both surfaces. 12/06/2021: No significant  change to her wounds, but she has a new opening on her dorsal foot, just distal to the right lateral ankle wound. The area on her left medial ankle that reopened looks a little bit larger today. She has quite a bit of pain associated with the new wound. 12/12/2021: Her wounds look about the same but the new opening on her right lateral dorsal foot is a little bit bigger. She continues to have a fair amount of pain with this wound. 12/26/2021: The left medial ankle wound is tiny and superficial. She has 2 areas of crusting on her left lateral ankle, however, that appear to be threatening to open again. Her right medial ankle wound is a little bit smaller today but still continues  to accumulate thick rubbery slough. The new dorsal foot wound is exquisitely painful but there is no odor or purulent drainage. No erythema or induration. The right lateral ankle wound looks about the same today, again with thick rubbery slough. 01/03/2022: The left medial ankle wound has closed again. Both right ankle wounds appear to be about the same size with thick rubbery slough. The dorsal foot wound on the right continues to be quite painful and she stated that she did not want any debridement of that site today. 01/10/2022: No real change to any of her wounds. She continues to accumulate thick slough. The dorsal foot wound has merged with the lateral malleolar wound. She is experiencing significant pain in the dorsal foot portion of the ulcer. 01/16/2022: Absolutely no change or progress in her wounds. 01/24/2022: Her wounds are unchanged. She continues to build up slough and the wounds on her dorsal right foot are still exquisitely tender. 01/31/2022: The wounds actually measure a little bit narrower today. They still have thick slough on the surface but the underlying tissue seems a little less fibrotic. We changed to Iodosorb last week. 02/07/2022: Wounds continue to slowly epithelialize around the parameters. She has less  pain today. She still accumulates a fairly substantial layer of slough. 02/15/2022: No change to the wounds overall. The measured a little bit larger today per the intake nurse. She continues to have substantial slough accumulation and her pain is a little bit worse. 02/21/2022. No change at all to her wounds. I do not see that plastic surgery has received her referral yet. 02/28/2022: The wounds remain unchanged. They are dry and fibrotic with accumulation of slough and eschar. She did receive an appointment to see plastic surgery on October 23, but the office called her back and indicated they needed to reschedule and that the next available appointment was not until December. The patient became angry and decided she did not want to see this plastics group. 10/19; the patient sees Dr. Maylene Roes again next week. She is using Medihoney as the primary dressing changing this herself. 03/21/2022: She saw Dr. Ferd Hibbs at the wound care center at [facility]. Dr. Ferd Hibbs was also unable to debride her, secondary to pain and is planning to take her to the operating room for operative debridement and potential skin substitute placement. Her wounds are unchanged with thick yellow slough and a fibrotic base. She says they are too painful to be debrided. In addition, yesterday her hemoglobin was 4 and she received 2 units packed red blood cell transfusion. 04/04/2022: Her operation at [facility] is scheduled for December 15. She continues to use Medihoney on her wounds. They are a little bit less painful today and she is willing to entertain the possibility of debridement. The wounds measured a little bit larger in all dimensions today but the layer of slough is not as thick as usual. 04/18/2022: Her wounds actually measured a little bit smaller today and the extension onto the dorsal foot from the lateral wound has healed. They are less tender and there is significantly less slough present as  compared to prior visits. 05/09/2022: She had to reschedule her surgery due to lack of childcare. It is now planned for January 5. The wounds are basically unchanged, but she had a lot more drainage, which was thick and somewhat purulent in appearance. No significant odor. 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 They are quite painful today and she does not want to have  debridement performed. 05/30/2022: The patient underwent surgical debridement and placement of TheraSkin to her wounds on January 5. She has been doing well and does not endorse any pain. The TheraSkin is intact and looks beautiful. It is sutured in place. There is no exudate or significant drainage. 06/04/2022: Her TheraSkin remains intact and I am starting to see granulation tissue emerging underneath the surface. No concern for infection. [name], WADLE ([id number]) [id number] Page 6 of 18 06/12/2022: The TheraSkin is intact and there is more granulation tissue emerging. It is starting to look a little bit dry, however. No malodor or purulent drainage. 06/19/2022: The TheraSkin continues to look good. The edges are a bit dry, but the central portion of each of her wounds is showing a nice pink color. 06/27/2022: The TheraSkin on the lateral wound remains almost completely intact. There is nice pink tissue peaking through the mesh of the TheraSkin. On the medial leg, it is starting to breakdown a little bit, but most of the TheraSkin remains intact here, as well. Both wounds measured smaller today. 07/03/2022: The wounds are fairly dry around the perimeter today. Some of the suture is hanging loose. 07/12/2022: Both wounds are measuring smaller today. The medial ulcer is getting a bit too dry. The lateral ulcer continues to have nice buds of granulation tissue emerging. 07/26/2022: The moisture balance on both wounds is much better today. For the first time since I began seeing her, I see actual  beefy red granulation tissue on the lateral ankle wound. There are more buds of deep pink granulation tissue emerging on the medial ankle. There is some dry eschar around the edges of the medial wound and a tiny amount of slough overlying the good granulation tissue on the lateral wound. 08/09/2022: Unfortunately, she has a new wound on her left lateral lower leg, just above the ankle. It is extremely painful for her. It penetrates to the fat layer and has the same woody, fibrotic character as her previous wounds. The other wounds are both a little bit smaller with slough and some eschar around the periphery. 08/14/2022: Her right lateral ankle wound is a little bit smaller. Unfortunately, she has had an increase in her drainage and it has a purulent nature to it, similar to what was present prior to her surgical debridement. She also reports odor coming from her wounds this week. The left lateral leg wound remains extremely tender. 08/21/2022: The left lateral ankle wound is larger and more painful today. Both right ankle wounds have a little bit more vital appearance, but they have accumulated more slough. She reports less odor this week. She is currently taking Augmentin. 08/28/2022: The left lateral ankle wound continues to enlarge and remains quite painful. Both of the right ankle wounds actually look better this week. There is improved tissue quality with less slough accumulation. 09/27/2022: The left lateral ankle wound actually looks nearly healed. She says that she has been applying some silver alginate that she had leftover. She was unable to afford the Santyl that was prescribed, but found a tube that she had on hand at home and has been using this on her right ankle wounds. These wounds look about the same and have accumulated the usual layer of slough. Unfortunately, a portion of the dorsal foot wound has reopened and is exquisitely painful today. 10/09/2022: The left lateral ankle wound is  healed. The dorsal foot wound appears to have expanded and remains exquisitely painful. The medial and lateral right ankle wounds look  about the same size, but they are quite a bit cleaner. 10/23/2022: The left lateral ankle remains closed. The dorsal foot wound has expanded to the point that it now converges with the right lateral leg wound. She has accumulated fairly thick slough on all of the open surfaces. 11/06/2022: No significant change to any of her wounds, although the dorsal foot aspect of the right lateral wound is a little bit smaller. She has been approved for the [employer] and [employer] for [employer] and will be able to get a full year's supply. 11/20/2022: The dorsal foot aspect of the right lateral wound has almost completely closed. I am seeing nice pink tissue starting to emerge from the fibrotic surface of both wounds. The medial leg wound appears to be a little bit smaller today. She has been using Santyl. 12/04/2022: The dorsal foot aspect of the right lateral wound has closed. There is more pink granulation tissue emerging on her wounds. There are buds of epithelium beginning to fill in from the edges of her wound, particularly at the proximal aspect of the medial wound and the distal aspect of the lateral wound. Significantly less slough formation than in the past. 12/18/2022: The wounds measured about the same size today, but there is better granulation tissue filling in and the overall surface consistency is improving. She does have slough accumulation on the surfaces, but no malodor or purulent drainage. 01/01/2023: No significant change to her wounds. The surface seems a little bit more fibrotic today. She is complaining of more pain. 01/15/2023: Her wounds are stable today. She has been taking Augmentin based on the culture that I took at her previous visit. She says that she has felt physically better since being on the antibiotic and has had less drainage from her  wounds. 01/29/2023: The wounds are unchanged and remained fibrotic and painful. She does report having less pain while on antibiotics and less drainage, as well. 02/07/2023: Her wounds have gotten larger and more painful. She completed her course of antibiotics yesterday. She still has a fair amount of drainage coming from her wounds. 02/24/2023: Her wounds continue to enlarge. They are more painful. Electronic Signature(s) Signed: 02/24/2023 10:16:14 AM By: [name] Guess MD FACS Entered By: [name] on 02/24/2023 07:16:14 -------------------------------------------------------------------------------- Physical Exam Details Patient Name: Date of Service: [name], [name] 02/24/2023 10:00 A M Medical Record Number: [id number] Patient Account Number: [id number] Date of Birth/Sex: Treating RN: 22-Jun-1970 (52 y.o. F) Primary Care Provider: Julianne [name] Other Clinician: Referring Provider: Treating Provider/Extender: [name] Weeks in Treatment: [address], [place] ([id number]) [id number] Page 7 of 18 Constitutional Hypertensive, asymptomatic. . . . no acute distress. Respiratory Normal work of breathing on room air. Notes 02/24/2023: Her wounds continue to enlarge. They are more painful. Electronic Signature(s) Signed: 02/24/2023 10:17:30 AM By: [name] Guess MD FACS Entered By: [name] on 02/24/2023 07:17:30 -------------------------------------------------------------------------------- Physician Orders Details Patient Name: Date of Service: [name], [name] 02/24/2023 10:00 A M Medical Record Number: [id number] Patient Account Number: [id number] Date of Birth/Sex: Treating RN: February 05, 1971 (52 y.o. 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 - Dr. Lady Gary Rm 2 Return A Anesthetic (In clinic) Topical Lidocaine 4% applied to wound bed Bathing/ Shower/ Hygiene May shower and wash wound with soap and water. Edema Control - Lymphedema / SCD / Other Elevate legs to the level of the heart or above for 30 minutes daily and/or when sitting for 3-4 times a day throughout the day. Avoid standing for long periods of time. Exercise regularly Additional Orders / Instructions Follow Nutritious Diet Juven Shake 1-2 times daily. Wound Treatment Wound #17 - Lower Leg Wound Laterality: Right, Lateral Cleanser: Soap and Water 1 x Per Week/30 Days Discharge Instructions: May shower and wash wound with dial antibacterial soap and water prior to dressing change. Cleanser: Wound Cleanser 1 x Per Week/30 Days Discharge Instructions: Cleanse the wound with wound cleanser prior to applying a clean dressing using gauze sponges, not tissue or cotton balls. Peri-Wound Care: Triamcinolone 15 (g) 1 x Per Week/30 Days Discharge Instructions: Use triamcinolone 15 (g) as directed Peri-Wound Care: Sween Lotion (Moisturizing lotion) 1 x Per Week/30 Days Discharge Instructions: Apply moisturizing lotion as directed Prim Dressing: Santyl Ointment 1 x Per Week/30 Days ary Discharge Instructions: Apply nickel thick amount to wound bed as instructed [name] ([id number]) [id number] Page 8 of 18 Secondary Dressing: ABD Pad, 5x9 1 x Per Week/30 Days Discharge Instructions: Apply over primary dressing as directed. Secondary Dressing: Woven Gauze Sponge, Non-Sterile 4x4 in 1 x Per Week/30  Days Discharge Instructions: Moisten with normal saline and Apply over primary dressing as directed. Secured With: Elastic Bandage 4 inch (ACE bandage) 1 x Per Week/30 Days Discharge Instructions: Secure with ACE bandage as directed. Secured With: [employer], 4.5x3.1 (in/yd) 1 x Per Week/30 Days Discharge Instructions: Secure with Kerlix as directed. Wound #21 - Ankle Wound Laterality: Right, Medial Cleanser: Soap and Water 1 x Per Week/30 Days Discharge Instructions: May shower and wash wound with dial antibacterial soap and water prior to dressing change. Cleanser: Wound Cleanser 1 x Per Week/30 Days Discharge Instructions: Cleanse the wound with wound cleanser prior to applying a clean dressing using gauze sponges, not tissue or cotton balls. Peri-Wound Care: Triamcinolone 15 (g) 1 x Per Week/30 Days Discharge Instructions: Use triamcinolone 15 (g) as directed Peri-Wound Care: Sween Lotion (Moisturizing lotion) 1 x Per Week/30 Days Discharge Instructions: Apply moisturizing lotion as directed Prim Dressing: Santyl Ointment 1 x Per Week/30 Days ary Discharge Instructions: Apply nickel thick amount to wound bed as instructed Secondary Dressing: ABD Pad, 5x9 1 x Per Week/30 Days Discharge Instructions: Apply over primary dressing as directed. Secondary Dressing: Woven Gauze Sponge, Non-Sterile 4x4 in 1 x Per Week/30 Days Discharge Instructions: Moisten with normal saline and Apply over primary dressing as directed. Secured With: Elastic Bandage 4 inch (ACE bandage) 1 x Per Week/30 Days Discharge Instructions: Secure with ACE bandage as directed. Secured With: [employer], 4.5x3.1 (in/yd) 1 x Per Week/30 Days Discharge Instructions: Secure with Kerlix as directed. Patient Medications llergies: No Known Allergies A Notifications Medication Indication Start End 02/24/2023 lidocaine DOSE topical 4 % cream - cream topical 02/24/2023 pentoxifylline DOSE oral 400 mg tablet  extended release - 1 tab p.o. twice daily Electronic Signature(s) Signed: 02/24/2023 10:35:48 AM By: [name] Guess MD FACS Previous Signature: 02/24/2023 10:25:41 AM Version By: [name] Guess MD FACS Entered By: [name] on 02/24/2023 07:26:52 -------------------------------------------------------------------------------- Problem List Details Patient Name: Date of Service: [name], [name] 02/24/2023 10:00 A M Medical Record Number: [id number]  Patient Account Number: [id number] Date of Birth/Sex: Treating RN: 13-Jul-1970 (52 y.o. F) Primary Care Provider: Julianne [name] Other Clinician: Referring Provider: Treating Provider/Extender: [name] Weeks in Treatment: [address] [place] ([id number]) [id number] Page 9 of 18 Active Problems ICD-10 Encounter Code Description Active Date MDM Diagnosis L97.818 Non-pressure chronic ulcer of other part of right lower leg with other specified 04/17/2021 No Yes severity 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 04/17/2021 No Yes Inactive Problems ICD-10 Code Description Active Date Inactive Date L97.828 Non-pressure chronic ulcer of other part of left lower leg with other specified severity 04/17/2021 04/17/2021 Resolved Problems ICD-10 Code Description Active Date Resolved Date L97.822 Non-pressure chronic ulcer of other part of left lower leg with fat layer exposed 08/09/2022 08/09/2022 Electronic Signature(s) Signed: 02/24/2023 10:14:30 AM By: [name] Guess MD FACS Entered By: [name] on 02/24/2023 07:14:30 -------------------------------------------------------------------------------- Progress Note Details Patient Name: Date of Service: [name], [name] 02/24/2023 10:00 A M Medical Record Number: [id number] Patient Account Number: [id number] Date of Birth/Sex: Treating RN: 01-09-1971 (52 y.o. F) Primary Care Provider: Julianne [name] Other Clinician: Referring  Provider: Treating Provider/Extender: [name] Weeks in Treatment: 96 Subjective Chief Complaint Information obtained from Patient the patient is here for evaluation of her bilateral lower extremity sickle cell ulcers 04/17/2021; patient comes in for substantial wounds on the right and left lower leg History of Present Illness (HPI) The following HPI elements were documented for the patient's wound: Location: medial and lateral ankle region on the right and left medial malleolus Quality: Patient reports experiencing a shooting pain to affected area(s). Severity: Patient states wound(s) are getting worse. Duration: right lower extremity bimalleolar ulcers have been present for approximately 2 years; the right medial malleolus ulcer has been there proximally 6 months Timing: Pain in wound is constant (hurts all the time) Context: The wound would happen gradually Associated Signs and Symptoms: Patient reports having increase discharge. 52 year old patient with a history of sickle cell anemia who was last seen by me with ulceration of the right lower extremity above the ankle and was referred to Dr. Marzetta [name] for a surgical debridement as I was unable to do anything in the office due to excruciating pain. At that stage she was referred from the plastic surgery service to dermatology who treated her for a skin infection with doxycycline and then Levaquin and a local antibiotic ointment. I understand the patient has since developed ulceration on the left ankle both medial and lateral and was now referred back to the wound center as dermatology has finished the management. I do not have any notes from the dermatology department Old notes: BRINLEIGH, TEW ([id number]) [id number] Page 107 of 53 53 year old patient with a history of sickle cell anemia, pain bilateral lower extremities, right lower extremity ulcer and has a history of receiving a skin  graft( Theraskin) several months ago. She has been visiting the wound center [facility] and was seen by Dr. Leanord Hawking and Dr. Marzetta [name]. after prolonged conservator therapy between July 2016 and January 2017. She had been seen by the plastic surgeon and taken to the OR for debridement and application of Theraskin. She had 3 applications of Theraskin and was then treated with collagen. Prior to that she had a history of similar problems in 2014 and was treated conservatively. Had a reflux study done for the right lower extremity in August 2016 without reflux or DVT . Past medical history significant for sickle cell disease, anemia, leg ulcers, cholelithiasis,and has never been a  smoker. Once the patient was discharged on the wound center she says within 2 or 3 weeks the problems recurred and she has been treating it conservatively. since I saw her 3 weeks ago at [facility] she has been unable to get her dressing material but has completed a course of doxycycline. 6/7/ 2017 -- lower extremity venous duplex reflux evaluation was done  No evidence of SVT or DVT in the RLL. No venous incompetence in the RLL. No further vascular workup is indicated at this time. She was seen by Dr. Mina Marble, on 10/04/2015. She agreed with the plan of taking her to the OR for debridement and application of theraskin and would also take biopsies to rule out pyoderma gangrenosum. Follow-up note dated May 31 received and she was status post application of Theraskin to multiple ulcers around the right ankle. Pathology did not show evidence of malignancy or pyoderma gangrenosum. She would continue to see as in the wound clinic for further care and see Dr. Marzetta [name] as needed. The patient brought the biopsy report and it was consistent with stasis ulcer no evidence of malignancy and the comment was that there was some adjacent neovascularization, fibrosis and patchy perivascular chronic inflammation. 11/15/2015 -- today we  applied her first application of Theraskin 11/30/15; TheraSkin #2 12/13/2015 -- she is having a lot of pain locally and is here for possible application of a theraskin today. 01/16/2016 -- the patient has significant pain and has noticed despite in spite of all local care and oral pain medication. It is impossible to debride her in the office. 02/06/2016 -- I do not see any notes from Dr. Leta Baptist( the patient has not made a call to the office know as she heard from them) and the only visit to recently was with her PCP Dr. Gypsy Decant -- I saw her on 01/16/2016 and prescribed 90 tablets of oxycodone 10 mg and did lab work and screening for HIV. the HIV was negative and hemoglobin was 6.3 with a WBC count of 14.9 and hematocrit of 17.8 with platelets of 561. 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 The bimalleolar ulcers to the right lower extremity have been present for approximately 2 years, the left medial malleolus ulcer has been present approximately 6 months. She has followed with Dr.Thimmappa in the past and has had a total of 3 applications of Theraskin (01/2015, 09/2015, 06/17/16). She has also followed with Dr. Meyer Russel here in the clinic and has received 2 applications of TheraSkin (11/10/15, 11/30/15). The patient does experience chronic, and is not amenable to debridement. 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 She is not currently on any antibiotic therapy and has not been treated with any recently. 07/17/2016 -- was seen by Dr. Leta Baptist of plastic surgery who saw her 2 weeks postop application of Theraskin #3. She had removed her dressing and asked her to apply silver alginate on alternate days and follow-up back with the wound center. Future debridements and application of skin substitute would have to be done in the hospital due  to her high risk for anesthesia. READMISSION 04/17/2021 Patient is now a 52 year old woman that we have had in this clinic for a prolonged period of time and 2016-2017 and then again for 2 visits in February 2018. At that point she had wounds on the right lower leg predominantly medial. She had  also been seen by plastic surgery Dr. Marzetta [name] who I believe took her to the OR for operative debridement and application of TheraSkin in 2017. After she left our clinic she was followed for a very prolonged period of time in the wound care center in [facility] who then referred her ultimately to [facility] where she was seen by Dr. Mardene Speak. Again taken her to the OR for skin grafting which apparently did not take. She had multiple other attempts at dressings although I have not really looked over all of these notes in great detail. She has not been seen in a wound care center in about a year. She states over the last year in addition to her right lower leg she has developed wounds on the left lower leg quite extensive. She is using Xeroform to all of these wounds without really any improvement. She also has Medicaid which does not cover wound products. The patient has had vascular work-ups in the past including most recently on 03/28/2021 showing biphasic waveforms on the right triphasic at the PTA and biphasic at the dorsalis pedis on the left. She was unable to tolerate any degree of compression to do ABIs. Unfortunately TBI's were also not done. She had venous reflux studies done in 2017. This did not show any evidence of a DVT or SVT and no venous incompetence was noted in the right leg at the time this was the only side with the wound As noted I did not look all over her old records. She apparently had a course of HBO and Baptist although I am not sure what the indication would have been. In any case she developed seizures and terminated treatment earlier. She is generally much more disabled than when we last  saw her in clinic. She can no longer walk pretty much wheelchair-bound because predominantly of pain in the left hip. 04/24/2021; the patient tolerated the wraps we put on. We used Santyl and Hydrofera Blue under compression. I brought her back for a nurse visit for a change in dressing. With Medicaid we will have a hard time getting anything paid for and hence the need for compression. She arrives in clinic with all the wounds looking somewhat better in terms of surface 12/20; circumferential wound on the right from the lateral to the medial. She has open areas on the left medial and left lateral x2 on all of this with the same surface. This does not look completely healthy although she does have some epithelialization. She is not complaining of a lot of pain which is unusual for her sickle ulcers. I have not looked over her extensive records from [facility]. She had recent arterial studies and has a history of venous reflux studies I will need to look these over although I do not believe she has significant arterial disease 2023 05/22/2021; patient's wound areas measure slightly smaller. Still a lot of drainage coming from the right we have been using Hydrofera Blue and Santyl with some improvement in the wound surfaces. She tells me she will be getting transfused later in the week for her underlying sickle cell anemia I have looked over her recent arterial studies which were done in the fall. This was in November and showed biphasic and triphasic waveforms but she could not tolerate ABIs because of pressure and unfortunately TBI's were not done. She has not had recent venous reflux studies that I can see 1/10; not much change about the same surface area. This has a yellowish surface to  it very gritty. We have been using Santyl and Hydrofera Blue for a prolonged period. Culture I did last week showed methicillin sensitive staph aureus "rare". Our intake nurse reports greenish drainage which may be  the Hydrofera Blue itself 1/17; wounds are continue to measure smaller although I am not sure about the accuracy here. Especially the areas on the right are covered in what looks to be a nonviable surface although she does have some epithelialization. Similarly she has areas on the left medial and left lateral ankle area which appear to have a better surface and perhaps are slightly smaller. We have been using Santyl and Hydrofera Blue. She cannot tolerate mechanical debridements She went for her reflux studies which showed significant reflux at the greater saphenous vein at the saphenofemoral junction as well as the greater saphenous vein in the proximal calf on the left she had reflux in the thigh and the common femoral vein and supra vein Fishel vein reflux in the greater saphenous vein. I will have vein and vascular look at this. My thoughts have been that these are likely sickle wounds. I looked through her old records from [facility] wound care center and then when she graduated to [facility] wound care center where she saw Dr. Rada Hay, [name] ([id number]) [id number] Page 11 of 18 [name] and Dr. Mardene Speak. Although I can see she had reflux studies done I do not see that she actually saw a vein and vascular. I went over the fact that she had operative debridements and actual skin grafting that did not take. I do not think these wounds have ever really progressed towards healing 1/31;Substantial wounds on the right ankle area. Hyper granulated very gritty adherent debris on the surface. 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 On the left she has no DVT or SVT deep vein reflux in the common femoral vein. Superficial vein reflux in the greater saphenous vein on although the vein diameter is not really all that large. I do not think there is anything that can be done with these although I am going to send her for consultation to vein and vascular. 2/7; Wound exam; substantial wound area on the right posterior ankle area and areas on the left medial ankle and left lateral ankle. I was able to debride the left medial ankle last week fairly aggressively and it is back this week to a completely nonviable surface She will see vascular surgery this Friday and I would like them to review the venous studies and also any comments on her arterial status. If they do not see an issue here I am going to refer her to plastic surgery for an operative debridement perhaps intraoperative ACell or Integra. Eventually she will require a deep tissue culture again 2/14; substantial wound area on the right posterior ankle, medial ankle. We have been using silver alginate The patient was seen by vein and vascular she had both venous reflux studies and arterial studies. 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 She had no evidence of  superficial venous thrombosis. She did have superficial vein reflux at the saphenofemoral junction and the greater saphenous vein. On the left no evidence of a DVT no evidence of superficial venous throat thrombosis she did have deep vein reflux in the common femoral vein and superficial vein reflux in the greater saphenous vein but these were not felt to be amenable to ablation. In terms of arterial studies she had triphasic and wife biphasic wave waveforms bilaterally not felt to have a significant arterial issue. I do not get the feeling that they felt that any part of her nonhealing wounds were related to either arterial  or venous issues. They did note that she had venous reflux at the right at the [facility] and [facility]. And also on the left there were reflux in the deep system at the common femoral vein and greater saphenous vein in the proximal thigh. Nothing amenable to ablation. 2/20; she is making some decent progress on the right where there is nice skin between the 2 open areas on the right ankle. The surfaces here do not look viable yet there is some surrounding epithelialization. She still has a small area on the left medial ankle area. Hyper-granulated [name] 2/28 patient has an appointment with plastic surgery on 3/8. We will see her back on 3/9. She may have to call us to get the area redressed. We've been using Santyl under silver alginate. We made a nice improvement on the left medial ankle. The larger wounds on the right also looks somewhat better in terms of epithelialization although I think they could benefit from an aggressive debridement if plastic surgery would be willing to do that. Perhaps placement of Integra or a cell 07/26/2021: She saw Dr. Arita Miss yesterday. He raised the question as to whether or not this might be pyoderma and wanted to wait until that question was answered by dermatology before proceeding with any sort of operative debridement. We have continue to use Santyl under silver alginate with Kerlix and Coban wraps. Overall, her wounds appear to be continuing to contract and epithelialize, with some granulation tissue present. There continues to be some slough on all wound surfaces. 08/09/2021: She has not been able to get an appointment with dermatology because apparently the offices in [facility] and [place] do not accept Medicaid. She is looking into whether or not she can be seen at the main [facility] dermatology clinic. This is necessary because plastic surgery is concerned that her wounds might represent pyoderma and they did not want to do any procedure until that  was clarified. We have been using Santyl under silver alginate with Kerlix and Coban wraps. Today, there was a greater amount of drainage on her dressings with a slight green discoloration and significant odor. Despite this, her wounds continue to contract and epithelialize. There is pale granulation tissue present and actually, on the left medial ankle, the granulation tissue is a bit hypertrophic. 08/16/2021: Last week, I took a culture and this grew back rare methicillin-resistant Staph aureus and rare corynebacterium. The MRSA was sensitive to gentamicin which we began applying topically on an empiric basis. This week, her wounds are a bit smaller and the drainage and odor are less. Her primary care provider is working on assisting the patient with a dermatology evaluation. She has been in silver alginate over the gentamicin that was started last week along with Kerlix and Coban wraps. 08/23/2021: Because she has Medicaid, we have been unable to get her into see  any dermatologist in the [place] to rule out pyoderma gangrenosum, which was a requirement from plastic surgery prior to any sort of debridement and grafting. Despite this, however, all of her wounds continue to get smaller. The wound on her left medial ankle is nearly closed. There is no odor from the wounds, although she still accumulates a modest amount of drainage on her dressings. 08/30/2021: The lateral right ankle wound and the medial left ankle wound are a bit smaller today. The medial right ankle wound is about the same size. They are less tender. We have still been unable to get her into dermatology. 09/06/2021: All of the wounds are about the same size today. She continues to endorse minimal pain. I communicated with Dr. Arita Miss in plastic surgery regarding our issues getting a dermatology appointment; he was out of town but indicated that he would look into perhaps performing the biopsy in his office and will have his office contact  her. 09/14/2021: The patient has an appointment in dermatology, but it is not until October. Her wounds are roughly the same; she continues to have very thick purulent-looking drainage on her dressings. 09/20/2021: The left medial wound is nearly closed and just has a bit of accumulated eschar on the surface. The right medial and lateral ankle wounds are perhaps a little bit smaller. They continue to have a very pale surface with accumulation of thin slough. 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 I did not think [name] was indicated based on this. She is getting topical mupirocin with Prisma silver collagen. 10/04/2021: The patient was not seen in clinic last week due to childcare coverage issues. In the interim, the left medial leg wound has closed. The right sided leg wounds are smaller. There is more granulation tissue coming through, particularly on the lateral wound. The surface remains somewhat gritty. We have been applying topical mupirocin and Prisma silver collagen. 10/11/2021: The left medial leg wound remains closed. She does complain of some anesthetic sensation to the area. Both of the right-sided leg wounds are smaller but still have accumulated slough. 10/18/2021: Both right-sided leg wounds are minimally smaller this week. She still continues to accumulate slough and has thick drainage on her dressings. 10/23/2021: Both wounds continue to contract. There is still slough buildup. She has been approved for a keratin-based skin substitute trial product but it will not be available until next week. 10/30/2021: The wounds are about the same to perhaps slightly smaller. There is still continued slough buildup. Unfortunately, the rep for the keratin based product did not show up today and did not answer his phone when called. 11/08/2021: The wounds are little bit smaller today. She continues to have thick drainage but the surfaces are relatively clean with just a little  bit of slough accumulation. She reported to me today that she is unable to completely flex her left ankle and on examination it seems this is potentially related to scar tissue from her wounds. We do have the ProgenaMatrix trial product available for her today. 11/15/2021: Both wounds are smaller today. There is some slough accumulation on the surfaces, but the medial wound, in particular looks like it is filling in and is less deep. She did hear from physical therapy and she is going to start working with them on July 11. She is here for her second application of the trial skin substitute, ProgenaMatrix. [name], [name] ([id number]) [id number] Page 12 of 18 11/22/2021: Both wounds continue to contract, the  medial more dramatically than the lateral. Both wounds have a layer of slough on the surface, but underneath this, the gritty fibrous tissue has a little bit more of a pink cast to it rather than being as pale as it has been. 11/29/2021: The wounds are roughly the same size this week, perhaps a millimeter or 2 smaller. The medial wound has filled in and is nearly flush with the surrounding skin surface. She continues to have a lot of slough accumulation on both surfaces. 12/06/2021: No significant change to her wounds, but she has a new opening on her dorsal foot, just distal to the right lateral ankle wound. The area on her left medial ankle that reopened looks a little bit larger today. She has quite a bit of pain associated with the new wound. 12/12/2021: Her wounds look about the same but the new opening on her right lateral dorsal foot is a little bit bigger. She continues to have a fair amount of pain with this wound. 12/26/2021: The left medial ankle wound is tiny and superficial. She has 2 areas of crusting on her left lateral ankle, however, that appear to be threatening to open again. Her right medial ankle wound is a little bit smaller today but still continues to  accumulate thick rubbery slough. The new dorsal foot wound is exquisitely painful but there is no odor or purulent drainage. No erythema or induration. The right lateral ankle wound looks about the same today, again with thick rubbery slough. 01/03/2022: The left medial ankle wound has closed again. Both right ankle wounds appear to be about the same size with thick rubbery slough. The dorsal foot wound on the right continues to be quite painful and she stated that she did not want any debridement of that site today. 01/10/2022: No real change to any of her wounds. She continues to accumulate thick slough. The dorsal foot wound has merged with the lateral malleolar wound. She is experiencing significant pain in the dorsal foot portion of the ulcer. 01/16/2022: Absolutely no change or progress in her wounds. 01/24/2022: Her wounds are unchanged. She continues to build up slough and the wounds on her dorsal right foot are still exquisitely tender. 01/31/2022: The wounds actually measure a little bit narrower today. They still have thick slough on the surface but the underlying tissue seems a little less fibrotic. We changed to Iodosorb last week. 02/07/2022: Wounds continue to slowly epithelialize around the parameters. She has less pain today. She still accumulates a fairly substantial layer of slough. 02/15/2022: No change to the wounds overall. The measured a little bit larger today per the intake nurse. She continues to have substantial slough accumulation and her pain is a little bit worse. 02/21/2022. No change at all to her wounds. I do not see that plastic surgery has received her referral yet. 02/28/2022: The wounds remain unchanged. They are dry and fibrotic with accumulation of slough and eschar. She did receive an appointment to see plastic surgery on October 23, but the office called her back and indicated they needed to reschedule and that the next available appointment was not until  December. The patient became angry and decided she did not want to see this plastics group. 10/19; the patient sees Dr. Maylene Roes again next week. She is using Medihoney as the primary dressing changing this herself. 03/21/2022: She saw Dr. Ferd Hibbs at the wound care center at [facility]. Dr. Ferd Hibbs was also unable to debride her, secondary to pain  and is planning to take her to the operating room for operative debridement and potential skin substitute placement. Her wounds are unchanged with thick yellow slough and a fibrotic base. She says they are too painful to be debrided. In addition, yesterday her hemoglobin was 4 and she received 2 units packed red blood cell transfusion. 04/04/2022: Her operation at [facility] is scheduled for December 15. She continues to use Medihoney on her wounds. They are a little bit less painful today and she is willing to entertain the possibility of debridement. The wounds measured a little bit larger in all dimensions today but the layer of slough is not as thick as usual. 04/18/2022: Her wounds actually measured a little bit smaller today and the extension onto the dorsal foot from the lateral wound has healed. They are less tender and there is significantly less slough present as compared to prior visits. 05/09/2022: She had to reschedule her surgery due to lack of childcare. It is now planned for January 5. The wounds are basically unchanged, but she had a lot more drainage, which was thick and somewhat purulent in appearance. No significant odor. 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 They are quite painful today and she does not want to have debridement performed. 05/30/2022: The patient underwent surgical debridement and placement of TheraSkin to her wounds on January 5. She has been doing well and does not endorse any pain. The TheraSkin is intact and looks beautiful. It is sutured in place. There is no exudate or  significant drainage. 06/04/2022: Her TheraSkin remains intact and I am starting to see granulation tissue emerging underneath the surface. No concern for infection. 06/12/2022: The TheraSkin is intact and there is more granulation tissue emerging. It is starting to look a little bit dry, however. No malodor or purulent drainage. 06/19/2022: The TheraSkin continues to look good. The edges are a bit dry, but the central portion of each of her wounds is showing a nice pink color. 06/27/2022: The TheraSkin on the lateral wound remains almost completely intact. There is nice pink tissue peaking through the mesh of the TheraSkin. On the medial leg, it is starting to breakdown a little bit, but most of the TheraSkin remains intact here, as well. Both wounds measured smaller today. 07/03/2022: The wounds are fairly dry around the perimeter today. Some of the suture is hanging loose. 07/12/2022: Both wounds are measuring smaller today. The medial ulcer is getting a bit too dry. The lateral ulcer continues to have nice buds of granulation tissue emerging. 07/26/2022: The moisture balance on both wounds is much better today. For the first time since I began seeing her, I see actual beefy red granulation tissue on the lateral ankle wound. There are more buds of deep pink granulation tissue emerging on the medial ankle. There is some dry eschar around the edges of the medial wound and a tiny amount of slough overlying the good granulation tissue on the lateral wound. 08/09/2022: Unfortunately, she has a new wound on her left lateral lower leg, just above the ankle. It is extremely painful for her. It penetrates to the fat layer and has the same woody, fibrotic character as her previous wounds. The other wounds are both a little bit smaller with slough and some eschar around the periphery. 08/14/2022: Her right lateral ankle wound is a little bit smaller. Unfortunately, she has had an increase in her drainage and it has a  purulent nature to it, similar to what was present  prior to her surgical debridement. She also reports odor coming from her wounds this week. The left lateral leg wound remains extremely tender. 08/21/2022: The left lateral ankle wound is larger and more painful today. Both right ankle wounds have a little bit more vital appearance, but they have accumulated more slough. She reports less odor this week. She is currently taking Augmentin. [name], [name] ([id number]) [id number] Page 13 of 18 08/28/2022: The left lateral ankle wound continues to enlarge and remains quite painful. Both of the right ankle wounds actually look better this week. There is improved tissue quality with less slough accumulation. 09/27/2022: The left lateral ankle wound actually looks nearly healed. She says that she has been applying some silver alginate that she had leftover. She was unable to afford the Santyl that was prescribed, but found a tube that she had on hand at home and has been using this on her right ankle wounds. These wounds look about the same and have accumulated the usual layer of slough. Unfortunately, a portion of the dorsal foot wound has reopened and is exquisitely painful today. 10/09/2022: The left lateral ankle wound is healed. The dorsal foot wound appears to have expanded and remains exquisitely painful. The medial and lateral right ankle wounds look about the same size, but they are quite a bit cleaner. 10/23/2022: The left lateral ankle remains closed. The dorsal foot wound has expanded to the point that it now converges with the right lateral leg wound. She has accumulated fairly thick slough on all of the open surfaces. 11/06/2022: No significant change to any of her wounds, although the dorsal foot aspect of the right lateral wound is a little bit smaller. She has been approved for the [employer] and [employer] for [employer] and will be able to get a full year's  supply. 11/20/2022: The dorsal foot aspect of the right lateral wound has almost completely closed. I am seeing nice pink tissue starting to emerge from the fibrotic surface of both wounds. The medial leg wound appears to be a little bit smaller today. She has been using Santyl. 12/04/2022: The dorsal foot aspect of the right lateral wound has closed. There is more pink granulation tissue emerging on her wounds. There are buds of epithelium beginning to fill in from the edges of her wound, particularly at the proximal aspect of the medial wound and the distal aspect of the lateral wound. Significantly less slough formation than in the past. 12/18/2022: The wounds measured about the same size today, but there is better granulation tissue filling in and the overall surface consistency is improving. She does have slough accumulation on the surfaces, but no malodor or purulent drainage. 01/01/2023: No significant change to her wounds. The surface seems a little bit more fibrotic today. She is complaining of more pain. 01/15/2023: Her wounds are stable today. She has been taking Augmentin based on the culture that I took at her previous visit. She says that she has felt physically better since being on the antibiotic and has had less drainage from her wounds. 01/29/2023: The wounds are unchanged and remained fibrotic and painful. She does report having less pain while on antibiotics and less drainage, as well. 02/07/2023: Her wounds have gotten larger and more painful. She completed her course of antibiotics yesterday. She still has a fair amount of drainage coming from her wounds. 02/24/2023: Her wounds continue to enlarge. They are more painful. Patient History Information obtained from Patient. Family History Diabetes - Mother, Lung Disease -  Mother, No family history of Cancer, Heart Disease, Hereditary Spherocytosis, Hypertension, Kidney Disease, Seizures, Stroke, Thyroid Problems, Tuberculosis. Social  History Never smoker, Marital Status - Married, Alcohol Use - Never, Drug Use - No History, Caffeine Use - Daily. Medical History Eyes Denies history of Cataracts, Glaucoma, Optic Neuritis Ear/Nose/Mouth/Throat Denies history of Chronic sinus problems/congestion, Middle ear problems Hematologic/Lymphatic Patient has history of Anemia, Sickle Cell Disease Denies history of Hemophilia, Human Immunodeficiency Virus, Lymphedema Respiratory Denies history of Aspiration, Asthma, Chronic Obstructive Pulmonary Disease (COPD), Pneumothorax, Sleep Apnea, Tuberculosis Cardiovascular Denies history of Angina, Arrhythmia, Congestive Heart Failure, Coronary Artery Disease, Deep Vein Thrombosis, Hypertension, Hypotension, Myocardial Infarction, Peripheral Arterial Disease, Peripheral Venous Disease, Phlebitis, Vasculitis Gastrointestinal Denies history of Cirrhosis , Colitis, Crohns, Hepatitis A, Hepatitis B, Hepatitis C Endocrine Denies history of Type I Diabetes, Type II Diabetes Genitourinary Denies history of End Stage Renal Disease Immunological Denies history of Lupus Erythematosus, Raynauds, Scleroderma Integumentary (Skin) Denies history of History of Burn Musculoskeletal Denies history of Gout, Rheumatoid Arthritis, Osteoarthritis, Osteomyelitis Neurologic Patient has history of Neuropathy - right foot intermittant Denies history of Dementia, Quadriplegia, Paraplegia, Seizure Disorder Oncologic Denies history of Received Chemotherapy, Received Radiation Psychiatric Denies history of Anorexia/bulimia, Confinement Anxiety Hospitalization/Surgery History - c section x2. - left breast lumpectomy. - iandD right ankle with theraskin. Medical A Surgical History Notes nd Constitutional Symptoms (General Health) H/O miscarriage Cardiovascular bradycardia Gastrointestinal cholilithiasis THALIA, TURKINGTON ([id number]) [id number] Page 14 of  18 Objective Constitutional Hypertensive, asymptomatic. no acute distress. Vitals Time Taken: 9:34 AM, Height: 67 in, Weight: 134 lbs, BMI: 21, Temperature: 98.1 F, Pulse: 84 bpm, Respiratory Rate: 18 breaths/min, Blood Pressure: 154/66 mmHg. Respiratory Normal work of breathing on room air. General Notes: 02/24/2023: Her wounds continue to enlarge. They are more painful. Integumentary (Hair, Skin) Wound #17 status is Open. Original cause of wound was Gradually Appeared. The date acquired was: 10/05/2012. The wound has been in treatment 96 weeks. The wound is located on the Right,Lateral Lower Leg. The wound measures 9cm length x 7.5cm width x 0.1cm depth; 53.014cm^2 area and 5.301cm^3 volume. There is Fat Layer (Subcutaneous Tissue) exposed. There is a medium amount of serosanguineous drainage noted. The wound margin is distinct with the outline attached to the wound base. There is small (1-33%) pink granulation within the wound bed. There is a large (67-100%) amount of necrotic tissue within the wound bed including Adherent Slough. The periwound skin appearance had no abnormalities noted for moisture. The periwound skin appearance exhibited: Scarring, Hemosiderin Staining. The periwound skin appearance did not exhibit: Ecchymosis. Periwound temperature was noted as No Abnormality. The periwound has tenderness on palpation. Wound #21 status is Open. Original cause of wound was Gradually Appeared. The date acquired was: 06/26/2021. The wound has been in treatment 86 weeks. The wound is located on the Right,Medial Ankle. The wound measures 12cm length x 4.9cm width x 0.1cm depth; 46.181cm^2 area and 4.618cm^3 volume. There is Fat Layer (Subcutaneous Tissue) exposed. There is no tunneling or undermining noted. There is a medium amount of serosanguineous drainage noted. The wound margin is distinct with the outline attached to the wound base. There is small (1-33%) pink granulation within the wound bed.  There is a large (67- 100%) amount of necrotic tissue within the wound bed including Eschar and Adherent Slough. The periwound skin appearance had no abnormalities noted for moisture. The periwound skin appearance had no abnormalities noted for color. The periwound skin appearance exhibited: Scarring. Periwound temperature was noted as No Abnormality. The  periwound has tenderness on palpation. 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 There was a Selective/Open Wound Non-Viable Tissue Debridement with a total area of 42.39 sq cm performed by [name] Guess, MD. With the following instrument(s): Curette to remove Non-Viable tissue/material. Material removed includes [facility] after achieving pain control using Lidocaine 4% Topical Solution. No specimens were taken. A time out was conducted at 09:56, prior to the start of the procedure. A Minimum amount of bleeding was controlled with Pressure. The procedure was tolerated well. Post Debridement Measurements: 9cm length x 7.5cm width x 0.1cm depth; 5.301cm^3 volume. Character of Wound/Ulcer Post Debridement is improved. Post procedure Diagnosis Wound #17: Same as Pre-Procedure Wound #21 Pre-procedure diagnosis of Wound #21 is a Sickle Cell Lesion located on the Right,Medial Ankle .Severity of Tissue Pre Debridement is: Fat layer exposed. There was a Selective/Open Wound Non-Viable Tissue Debridement with a total area of 11.54 sq cm performed by [name] Guess, MD. With the following instrument(s): Curette to remove Non-Viable tissue/material. Material removed includes [facility] after achieving pain control using Lidocaine 4% Topical Solution. No specimens were taken. A time out was conducted at 09:56, prior to the  start of the procedure. A Minimum amount of bleeding was controlled with Pressure. The procedure was tolerated well. Post Debridement Measurements: 12cm length x 4.9cm width x 0.1cm depth; 4.618cm^3 volume. Character of Wound/Ulcer Post Debridement is improved. Severity of Tissue Post Debridement is: Fat layer exposed. Post procedure Diagnosis Wound #21: Same as Pre-Procedure Plan PATRICI, MINNIS ([id number]) [phone].pdf Page 15 of 18 Follow-up Appointments: Return Appointment in 2 weeks. - Dr. Lady Gary Rm 2 Anesthetic: (In clinic) Topical Lidocaine 4% applied to wound bed Bathing/ Shower/ Hygiene: May shower and wash wound with soap and water. Edema Control - Lymphedema / SCD / Other: Elevate legs to the level of the heart or above for 30 minutes daily and/or when sitting for 3-4 times a day throughout the day. Avoid standing for long periods of time. Exercise regularly Additional Orders / Instructions: Follow Nutritious Diet Juven Shake 1-2 times daily. The following medication(s) was prescribed: lidocaine topical 4 % cream cream topical was prescribed at facility pentoxifylline oral 400 mg tablet extended release 1 tab p.o. twice daily starting 02/24/2023 WOUND #17: - Lower Leg Wound Laterality: Right, Lateral Cleanser: Soap and Water 1 x Per Week/30 Days Discharge Instructions: May shower and wash wound with dial antibacterial soap and water prior to dressing change. Cleanser: Wound Cleanser 1 x Per Week/30 Days Discharge Instructions: Cleanse the wound with wound cleanser prior to applying a clean dressing using gauze sponges, not tissue or cotton balls. Peri-Wound Care: Triamcinolone 15 (g) 1 x Per Week/30 Days Discharge Instructions: Use triamcinolone 15 (g) as directed Peri-Wound Care: Sween Lotion (Moisturizing lotion) 1 x Per Week/30 Days Discharge Instructions: Apply moisturizing lotion as directed Prim Dressing: Santyl Ointment 1 x Per Week/30  Days ary Discharge Instructions: Apply nickel thick amount to wound bed as instructed Secondary Dressing: ABD Pad, 5x9 1 x Per Week/30 Days Discharge Instructions: Apply over primary dressing as directed. Secondary Dressing: Woven Gauze Sponge, Non-Sterile 4x4 in 1 x Per Week/30 Days Discharge Instructions: Moisten with normal saline and Apply over primary dressing as directed. Secured With: Elastic Bandage 4 inch (ACE bandage) 1 x Per Week/30 Days Discharge Instructions: Secure with  ACE bandage as directed. Secured With: [employer], 4.5x3.1 (in/yd) 1 x Per Week/30 Days Discharge Instructions: Secure with Kerlix as directed. WOUND #21: - Ankle Wound Laterality: Right, Medial Cleanser: Soap and Water 1 x Per Week/30 Days Discharge Instructions: May shower and wash wound with dial antibacterial soap and water prior to dressing change. Cleanser: Wound Cleanser 1 x Per Week/30 Days Discharge Instructions: Cleanse the wound with wound cleanser prior to applying a clean dressing using gauze sponges, not tissue or cotton balls. Peri-Wound Care: Triamcinolone 15 (g) 1 x Per Week/30 Days Discharge Instructions: Use triamcinolone 15 (g) as directed Peri-Wound Care: Sween Lotion (Moisturizing lotion) 1 x Per Week/30 Days Discharge Instructions: Apply moisturizing lotion as directed Prim Dressing: Santyl Ointment 1 x Per Week/30 Days ary Discharge Instructions: Apply nickel thick amount to wound bed as instructed Secondary Dressing: ABD Pad, 5x9 1 x Per Week/30 Days Discharge Instructions: Apply over primary dressing as directed. Secondary Dressing: Woven Gauze Sponge, Non-Sterile 4x4 in 1 x Per Week/30 Days Discharge Instructions: Moisten with normal saline and Apply over primary dressing as directed. Secured With: Elastic Bandage 4 inch (ACE bandage) 1 x Per Week/30 Days Discharge Instructions: Secure with ACE bandage as directed. Secured With: [employer], 4.5x3.1 (in/yd) 1 x Per  Week/30 Days Discharge Instructions: Secure with Kerlix as directed. 02/24/2023: Her wounds continue to enlarge. They are more painful. I used a curette to debride as much of her wounds as she would permit, secondary to discomfort. She continues on Augmentin, which does seem to keep her drainage down. We will continue to use Santyl for enzymatic debridement between visits. I also recommended that she wrap her leg quite snugly when she changes her dressings to provide better compression benefit. I used did have a discussion with one of the physician a leaders at [facility] regarding her case. She suggested a trial of pentoxifylline. I have prescribed this. Other suggestions included topical cyclosporine, but this is off label and I do not think I will be able to get it approved by Medicaid, and hyperbaric oxygen therapy. As this is not a CMS-approved indication, I do not think we will be able to offer this to her, either. We are planning to discuss her case at the wound Discussion later this month. Electronic Signature(s) Signed: 02/24/2023 10:29:52 AM By: [name] Guess MD FACS Entered By: [name] on 02/24/2023 07:29:52 -------------------------------------------------------------------------------- HxROS Details Patient Name: Date of Service: [name], [name] 02/24/2023 10:00 A M Medical Record Number: [id number] Patient Account Number: [id number] ALAWNA, GRAYBEAL ([id number]) [phone].pdf Page 16 of 18 Date of Birth/Sex: Treating RN: [birth year] (52 y.o. F) Primary Care Provider: Other Clinician: Julianne [name] Referring Provider: Treating Provider/Extender: [name] Weeks in Treatment: 96 Information Obtained From Patient Constitutional Symptoms (General Health) Medical History: Past Medical History Notes: H/O miscarriage Eyes Medical History: Negative for: Cataracts; Glaucoma; Optic Neuritis Ear/Nose/Mouth/Throat Medical  History: Negative for: Chronic sinus problems/congestion; Middle ear problems Hematologic/Lymphatic Medical History: Positive for: Anemia; Sickle Cell Disease Negative for: Hemophilia; Human Immunodeficiency Virus; Lymphedema Respiratory Medical History: Negative for: Aspiration; Asthma; Chronic Obstructive Pulmonary Disease (COPD); Pneumothorax; Sleep Apnea; Tuberculosis Cardiovascular Medical History: Negative for: Angina; Arrhythmia; Congestive Heart Failure; Coronary Artery Disease; Deep Vein Thrombosis; Hypertension; Hypotension; Myocardial Infarction; Peripheral Arterial Disease; Peripheral Venous Disease; Phlebitis; Vasculitis Past Medical History Notes: bradycardia Gastrointestinal Medical History: Negative for: Cirrhosis ; Colitis; Crohns; Hepatitis A; Hepatitis B; Hepatitis C Past Medical History Notes: cholilithiasis Endocrine Medical History: Negative for: Type I  Diabetes; Type II Diabetes Genitourinary Medical History: Negative for: End Stage Renal Disease Immunological Medical History: Negative for: Lupus Erythematosus; Raynauds; Scleroderma Integumentary (Skin) Medical History: Negative for: History of Burn Musculoskeletal Medical History: Negative for: Gout; Rheumatoid Arthritis; Osteoarthritis; Osteomyelitis Neurologic Medical History: Positive for: Neuropathy - right foot intermittant BIEDERMAN, [name] ([id number]) [id number] Page 17 of 18 Negative for: Dementia; Quadriplegia; Paraplegia; Seizure Disorder Oncologic Medical History: Negative for: Received Chemotherapy; Received Radiation Psychiatric Medical History: Negative for: Anorexia/bulimia; Confinement Anxiety Immunizations Pneumococcal Vaccine: Received Pneumococcal Vaccination: No Implantable Devices None Hospitalization / Surgery History Type of Hospitalization/Surgery c section x2 left breast lumpectomy iandD right ankle with theraskin Family and Social  History Cancer: No; Diabetes: Yes - Mother; Heart Disease: No; Hereditary Spherocytosis: No; Hypertension: No; Kidney Disease: No; Lung Disease: Yes - Mother; Seizures: No; Stroke: No; Thyroid Problems: No; Tuberculosis: No; Never smoker; Marital Status - Married; Alcohol Use: Never; Drug Use: No History; Caffeine Use: Daily; Financial Concerns: No; Food, Clothing or Shelter Needs: No; Support System Lacking: No; Transportation Concerns: No Electronic Signature(s) Signed: 02/24/2023 10:35:48 AM By: [name] Guess MD FACS Entered By: [name] on 02/24/2023 07:16:23 -------------------------------------------------------------------------------- SuperBill Details Patient Name: Date of Service: [name], [name] 02/24/2023 Medical Record Number: [id number] Patient Account Number: [id number] Date of Birth/Sex: Treating RN: Jan 24, 1971 (52 y.o. F) Primary Care Provider: Julianne [name] Other Clinician: Referring Provider: Treating Provider/Extender: [name] Weeks in Treatment: 96 Diagnosis Coding ICD-10 Codes Code Description L97.818 Non-pressure chronic ulcer of other part of right lower leg with other specified severity 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 Facility Procedures : CPT4 Code: [id number] Description: [phone] - DEBRIDE WOUND 1ST 20 SQ CM OR < ICD-10 Diagnosis Description L97.818 Non-pressure chronic ulcer of other part of right lower leg with other specified s Modifier: everity Quantity: 1 : CPT4 Code: [id number] Description: 87564 - DEBRIDE WOUND EA ADDL 20 SQ CM ICD-10 Diagnosis Description L97.818 Non-pressure chronic ulcer of other part of right lower leg with other specified s Modifier: everity Quantity: 2 Physician Procedures : CPT4 Code Description Modifier MARGARETHA, MAHAN ([id number]) [id number] - WC PHYS LEVEL 4 - EST PT 25 1 ICD-10 Diagnosis Description L97.818 Non-pressure chronic ulcer of  other part of right lower leg with other  specified severity 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 Quantity: df Page 18 of 18 : [id number] - WC PHYS DEBR WO ANESTH 20 SQ CM 1 ICD-10 Diagnosis Description L97.818 Non-pressure chronic ulcer of other part of right lower leg with other specified severity Quantity: : [id number] - WC PHYS DEBR WO ANESTH EA ADD 20 CM 2 ICD-10 Diagnosis Description L97.818 Non-pressure chronic ulcer of other part of right lower leg with other specified severity Quantity: Electronic Signature(s) Signed: 02/24/2023 10:30:52 AM By: [name] Guess MD FACS Entered By: [name] on 02/24/2023 07:30:52

## 2023-03-10 ENCOUNTER — Ambulatory Visit (HOSPITAL_BASED_OUTPATIENT_CLINIC_OR_DEPARTMENT_OTHER): Payer: Medicaid Other | Admitting: General Surgery

## 2023-03-12 ENCOUNTER — Other Ambulatory Visit: Payer: Self-pay | Admitting: Nurse Practitioner

## 2023-03-12 DIAGNOSIS — D571 Sickle-cell disease without crisis: Secondary | ICD-10-CM

## 2023-03-12 NOTE — Telephone Encounter (Signed)
Caller & Relationship to patient:  MRN #  [id number]   Call Back Number:   Date of Last Office Visit: 02/18/2023     Date of Next Office Visit: 04/01/2023    Medication(s) to be Refilled: Oxycodone   Preferred Pharmacy:   ** Please notify patient to allow 48-72 hours to process** **Let patient know to contact pharmacy at the end of the day to make sure medication is ready. ** **If patient has not been seen in a year or longer, book an appointment **Advise to use MyChart for refill requests OR to contact their pharmacy

## 2023-03-12 NOTE — Telephone Encounter (Signed)
Please advise Kh

## 2023-03-17 ENCOUNTER — Other Ambulatory Visit: Payer: Self-pay | Admitting: Internal Medicine

## 2023-03-17 ENCOUNTER — Telehealth: Payer: Self-pay | Admitting: Family Medicine

## 2023-03-17 DIAGNOSIS — G894 Chronic pain syndrome: Secondary | ICD-10-CM

## 2023-03-17 MED ORDER — OXYCODONE HCL 10 MG PO TABS
10.0000 mg | ORAL_TABLET | ORAL | 0 refills | Status: DC
Start: 2023-03-17 — End: 2023-04-01

## 2023-03-17 NOTE — Telephone Encounter (Signed)
Oxycodone refill request.

## 2023-03-18 ENCOUNTER — Encounter (HOSPITAL_BASED_OUTPATIENT_CLINIC_OR_DEPARTMENT_OTHER): Payer: Medicaid Other | Admitting: General Surgery

## 2023-03-18 DIAGNOSIS — L97818 Non-pressure chronic ulcer of other part of right lower leg with other specified severity: Secondary | ICD-10-CM | POA: Diagnosis not present

## 2023-03-18 NOTE — Progress Notes (Signed)
TRINTY, ALVILLAR ([id number]) [id number] Page 1 of 7 Visit Report for 03/18/2023 Arrival Information Details Patient Name: Date of Service: [name] [name] 03/18/2023 10:00 A M Medical Record Number: [id number] Patient Account Number: [id number] Date of Birth/Sex: Treating RN: [birth year] (52 y.o. [name], [name] Primary Care Nalea Salce: Julianne Handler Other Clinician: Referring Britni Driscoll: Treating Shamecca Whitebread/Extender: Koleen Nimrod Weeks in Treatment: 100 Visit Information History Since Last Visit Added or deleted any medications: No Patient Arrived: Ambulatory Any new allergies or adverse reactions: No Arrival Time: 09:44 Had a fall or experienced change in No Accompanied By: self activities of daily living that may affect Transfer Assistance: None risk of falls: Patient Identification Verified: Yes Signs or symptoms of abuse/neglect since last visito No Secondary Verification Process Completed: Yes Hospitalized since last visit: No Patient Requires Transmission-Based Precautions: No Implantable device outside of the clinic excluding No Patient Has Alerts: No cellular tissue based products placed in the center since last visit: Has Dressing in Place as Prescribed: Yes Has Compression in Place as Prescribed: Yes Pain Present Now: Yes Electronic Signature(s) Signed: 03/18/2023 1:45:14 PM By: Samuella Bruin Entered By: Samuella Bruin on 03/18/2023 09:46:19 -------------------------------------------------------------------------------- Lower Extremity Assessment Details Patient Name: Date of Service: [name], [name] 03/18/2023 10:00 A M Medical Record Number: [id number] Patient Account Number: [id number] Date of Birth/Sex: Treating RN: [birth year] (52 y.o. [name] Primary Care Damone Fancher: Julianne Handler Other Clinician: Referring Jaice Digioia: Treating Lilli Dewald/Extender: Koleen Nimrod Weeks in Treatment: 100 Edema Assessment Assessed: [Left: No] [Right: No] Edema: [Left: N] [Right: o] Calf Left: Right: Point of Measurement: 33 cm From Medial Instep 30.5 cm Ankle Left: Right: Point of Measurement: 10 cm From Medial Instep 20 cm Vascular Assessment Pulses: Dorsalis Pedis Palpable: [Right:Yes] [name] ([id number]) [Right:[id number] Page 2 of 7] Extremity colors, hair growth, and conditions: Extremity Color: [Right:Normal] Hair Growth on Extremity: [Right:No] Temperature of Extremity: [Right:Warm] Capillary Refill: [Right:< 3 seconds] Dependent Rubor: [Right:No No] Electronic Signature(s) Signed: 03/18/2023 1:45:14 PM By: Samuella Bruin Entered By: Samuella Bruin on 03/18/2023 09:53:48 -------------------------------------------------------------------------------- Multi Wound Chart Details Patient Name: Date of Service: [name], [name] 03/18/2023 10:00 A M Medical Record Number: [id number] Patient Account Number: [id number] Date of Birth/Sex: Treating RN: 11/23/1970 (52 y.o. F) Primary Care [name]: Julianne Handler Other Clinician: Referring Ilyana Manuele: Treating Elza Varricchio/Extender: Koleen Nimrod Weeks in Treatment: 100 Vital Signs Height(in): 67 Pulse(bpm): 96 Weight(lbs): 134 Blood Pressure(mmHg): 135/71 Body Mass Index(BMI): 21 Temperature(F): 98.1 Respiratory Rate(breaths/min): 18 [17:Photos:] [N/A:N/A] Right, Lateral Lower Leg Right, Medial Ankle N/A Wound Location: Gradually Appeared Gradually Appeared N/A Wounding Event: Sickle Cell Lesion Sickle Cell Lesion N/A Primary Etiology: N/A Venous Leg Ulcer N/A Secondary Etiology: Anemia, Sickle Cell Disease, Anemia, Sickle Cell Disease, N/A Comorbid History: Neuropathy Neuropathy 10/05/2012 06/26/2021 N/A Date Acquired: 100 90 N/A Weeks of Treatment: Open Open N/A Wound Status: No No N/A Wound Recurrence: Yes No  N/A Clustered Wound: 9.5x8x0.2 12.8x6x0.2 N/A Measurements L x W x D (cm) 59.69 60.319 N/A A (cm) : rea 11.938 12.064 N/A Volume (cm) : 68.20% -108.10% N/A % Reduction in Area: 36.50% -316.30% N/A % Reduction in Volume: Full Thickness Without Exposed Full Thickness Without Exposed N/A Classification: Support Structures Support Structures Large Large N/A Exudate Amount: Serosanguineous Serosanguineous N/A Exudate Type: red, brown red, brown N/A Exudate Color: Distinct, outline attached Distinct, outline attached N/A Wound Margin: Small (1-33%) Small (1-33%) N/A Granulation Amount: Pink Pink N/A Granulation Quality: Large (67-100%) Large (67-100%) N/A Necrotic  Amount: Fat Layer (Subcutaneous Tissue): Yes Fat Layer (Subcutaneous Tissue): Yes N/A Exposed Structures: Fascia: No Fascia: No Tendon: No Tendon: No Muscle: No Muscle: No Joint: No Joint: No Bone: No Bone: No EVER, GLAVE ([id number]) [id number] Page 3 of 7 None None N/A Epithelialization: Scarring: Yes Scarring: Yes N/A Periwound Skin Texture: No Abnormalities Noted No Abnormalities Noted N/A Periwound Skin Moisture: Hemosiderin Staining: Yes No Abnormalities Noted N/A Periwound Skin Color: Ecchymosis: No No Abnormality No Abnormality N/A Temperature: Yes Yes N/A Tenderness on Palpation: Treatment Notes Electronic Signature(s) Signed: 03/18/2023 10:38:28 AM By: [name] Guess MD FACS Entered By: [name] on 03/18/2023 10:38:27 -------------------------------------------------------------------------------- Multi-Disciplinary Care Plan Details Patient Name: Date of Service: [name] URO [name], [name] 03/18/2023 10:00 A M Medical Record Number: [id number] Patient Account Number: [id number] Date of Birth/Sex: Treating RN: 10/29/1970 (52 y.o. [name] Primary Care Seham Gardenhire: Julianne Handler Other Clinician: Referring Kwame Ryland: Treating Sierah Lacewell/Extender:  Koleen Nimrod Weeks in Treatment: 100 Multidisciplinary Care Plan reviewed with physician Active Inactive Necrotic Tissue Nursing Diagnoses: Impaired tissue integrity related to necrotic/devitalized tissue Knowledge deficit related to management of necrotic/devitalized tissue Goals: Necrotic/devitalized tissue will be minimized in the wound bed Date Initiated: 12/04/2022 Target Resolution Date: 04/18/2023 Goal Status: Active Patient/caregiver will verbalize understanding of reason and process for debridement of necrotic tissue Date Initiated: 12/04/2022 Target Resolution Date: 04/18/2023 Goal Status: Active Interventions: Assess patient pain level pre-, during and post procedure and prior to discharge Provide education on necrotic tissue and debridement process Treatment Activities: Apply topical anesthetic as ordered : 12/04/2022 Enzymatic debridement : 12/04/2022 Notes: Electronic Signature(s) Signed: 03/18/2023 1:45:14 PM By: Samuella Bruin Entered By: Samuella Bruin on 03/18/2023 10:01:29 Pain Assessment Details -------------------------------------------------------------------------------- [name] ([id number]) [id number] Page 4 of 7 Patient Name: Date of Service: [name], [place] [name] 03/18/2023 10:00 A M Medical Record Number: [id number] Patient Account Number: [id number] Date of Birth/Sex: Treating RN: [birth year] (53 y.o. [name] Primary Care Maryfer Tauzin: Julianne Handler Other Clinician: Referring Cleatus Gabriel: Treating Josceline Chenard/Extender: Koleen Nimrod Weeks in Treatment: 100 Active Problems Location of Pain Severity and Description of Pain Patient Has Paino Yes Site Locations Pain Location: Pain in Ulcers Duration of the Pain. Constant / Intermittento Constant Rate the pain. Current Pain Level: 8 Character of Pain Describe the Pain: Burning Pain Management and  Medication Current Pain Management: Medication: Yes Electronic Signature(s) Signed: 03/18/2023 1:45:14 PM By: Samuella Bruin Entered By: Samuella Bruin on 03/18/2023 09:47:25 -------------------------------------------------------------------------------- Patient/Caregiver Education Details Patient Name: Date of Service: [name], [name] 10/29/2024andnbsp10:00 A M Medical Record Number: [id number] Patient Account Number: [id number] Date of Birth/Gender: Treating RN: Aug 02, 1970 (52 y.o. [name] Primary Care Physician: Julianne Handler Other Clinician: Referring Physician: Treating Physician/Extender: Koleen Nimrod Weeks in Treatment: 100 Education Assessment Education Provided To: Patient Education Topics Provided Wound/Skin Impairment: Methods: Explain/Verbal Responses: Reinforcements needed, State content correctly Electronic Signature(s) Signed: 03/18/2023 1:45:14 PM By: Alger Simons, Felecia Shelling ([id number]) [id number] Page 5 of 7 Entered By: Samuella Bruin on 03/18/2023 10:01:42 -------------------------------------------------------------------------------- Wound Assessment Details Patient Name: Date of Service: [name] [name] 03/18/2023 10:00 A M Medical Record Number: [id number] Patient Account Number: [id number] Date of Birth/Sex: Treating RN: 11/13/1970 (52 y.o. [name] Primary Care Alinna Siple: Julianne Handler Other Clinician: Referring Chayna Surratt: Treating Joshalyn Ancheta/Extender: Koleen Nimrod Weeks in Treatment: 100 Wound Status Wound Number: [id number] Primary Etiology: Sickle Cell Lesion Wound Location: Right, Lateral Lower Leg Wound Status: Open Wounding Event: Gradually Appeared Comorbid History:  Anemia, Sickle Cell Disease, Neuropathy Date Acquired: 10/05/2012 Weeks Of Treatment: 100 Clustered Wound: Yes Photos Wound Measurements Length: (cm) 9.5 Width:  (cm) 8 Depth: (cm) 0.2 Area: (cm) 59.69 Volume: (cm) 11.938 % Reduction in Area: 68.2% % Reduction in Volume: 36.5% Epithelialization: None Tunneling: No Undermining: No Wound Description Classification: Full Thickness Without Exposed Support Structures Wound Margin: Distinct, outline attached Exudate Amount: Large Exudate Type: Serosanguineous Exudate Color: red, brown Foul Odor After Cleansing: No Slough/Fibrino Yes Wound Bed Granulation Amount: Small (1-33%) Exposed Structure Granulation Quality: Pink Fascia Exposed: No Necrotic Amount: Large (67-100%) Fat Layer (Subcutaneous Tissue) Exposed: Yes Necrotic Quality: Adherent Slough Tendon Exposed: No Muscle Exposed: No Joint Exposed: No Bone Exposed: No Periwound Skin Texture Texture Color No Abnormalities Noted: No No Abnormalities Noted: No Scarring: Yes Ecchymosis: No Hemosiderin Staining: Yes Moisture No Abnormalities Noted: Yes Temperature / Pain Temperature: No Abnormality Tenderness on Palpation: KIMBERLIE, TINNEY ([id number]) [id number] Page 6 of 7 Electronic Signature(s) Signed: 03/18/2023 1:45:14 PM By: Gelene Mink By: Samuella Bruin on 03/18/2023 09:56:37 -------------------------------------------------------------------------------- Wound Assessment Details Patient Name: Date of Service: [name], [place] [name] 03/18/2023 10:00 A M Medical Record Number: [id number] Patient Account Number: [id number] Date of Birth/Sex: Treating RN: 12-20-1970 (52 y.o. [name] Primary Care Brylee Mcgreal: Julianne Handler Other Clinician: Referring Xenia Nile: Treating Aralynn Brake/Extender: Koleen Nimrod Weeks in Treatment: 100 Wound Status Wound Number: [id number] Primary Etiology: Sickle Cell Lesion Wound Location: Right, Medial Ankle Secondary Etiology: Venous Leg Ulcer Wounding Event: Gradually Appeared Wound Status: Open Date Acquired:  06/26/2021 Comorbid History: Anemia, Sickle Cell Disease, Neuropathy Weeks Of Treatment: 90 Clustered Wound: No Photos Wound Measurements Length: (cm) 12.8 Width: (cm) 6 Depth: (cm) 0.2 Area: (cm) 60.319 Volume: (cm) 12.064 % Reduction in Area: -108.1% % Reduction in Volume: -316.3% Epithelialization: None Tunneling: No Undermining: No Wound Description Classification: Full Thickness Without Exposed Support Structures Wound Margin: Distinct, outline attached Exudate Amount: Large Exudate Type: Serosanguineous Exudate Color: red, brown Foul Odor After Cleansing: No Slough/Fibrino Yes Wound Bed Granulation Amount: Small (1-33%) Exposed Structure Granulation Quality: Pink Fascia Exposed: No Necrotic Amount: Large (67-100%) Fat Layer (Subcutaneous Tissue) Exposed: Yes Necrotic Quality: Adherent Slough Tendon Exposed: No Muscle Exposed: No Joint Exposed: No Bone Exposed: No Periwound Skin Texture Texture Color No Abnormalities Noted: No No Abnormalities Noted: Yes Scarring: Yes Temperature / Pain Temperature: No Abnormality Moisture No Abnormalities Noted: Yes Tenderness on Palpation: SAMORAH, SPRINGLE ([id number]) [id number] Page 7 of 7 Electronic Signature(s) Signed: 03/18/2023 1:45:14 PM By: Gelene Mink By: Samuella Bruin on 03/18/2023 09:57:14 -------------------------------------------------------------------------------- Vitals Details Patient Name: Date of Service: [name] URO [name], [name] 03/18/2023 10:00 A M Medical Record Number: [id number] Patient Account Number: [id number] Date of Birth/Sex: Treating RN: 05-Jul-1970 (52 y.o. [name] Primary Care Sorcha Rotunno: Julianne Handler Other Clinician: Referring Bich Mchaney: Treating Victor Langenbach/Extender: Koleen Nimrod Weeks in Treatment: 100 Vital Signs Time Taken: 09:47 Temperature (F): 98.1 Height (in): 67 Pulse (bpm): 96 Weight (lbs):  134 Respiratory Rate (breaths/min): 18 Body Mass Index (BMI): 21 Blood Pressure (mmHg): 135/71 Reference Range: 80 - 120 mg / dl Electronic Signature(s) Signed: 03/18/2023 1:45:14 PM By: Samuella Bruin Entered By: Samuella Bruin on 03/18/2023 09:47:39

## 2023-03-18 NOTE — Progress Notes (Addendum)
CORTLYN, HEGEL ([id number]) [id number] Page 1 of 16 Visit Report for 03/18/2023 Chief Complaint Document Details Patient Name: Date of Service: [name] [name] 03/18/2023 10:00 A M Medical Record Number: [id number] Patient Account Number: [id number] Date of Birth/Sex: Treating RN: [birth year] (52 y.o. F) Primary Care Provider: Julianne Handler Other Clinician: Referring Provider: Treating Provider/Extender: Koleen Nimrod Weeks in Treatment: 100 Information Obtained from: Patient Chief Complaint the patient is here for evaluation of her bilateral lower extremity sickle cell ulcers 04/17/2021; patient comes in for substantial wounds on the right and left lower leg Electronic Signature(s) Signed: 03/18/2023 10:38:35 AM By: Duanne Guess MD FACS Entered By: Duanne Guess on 03/18/2023 10:38:35 -------------------------------------------------------------------------------- HPI Details Patient Name: Date of Service: [name], [name] 03/18/2023 10:00 A M Medical Record Number: [id number] Patient Account Number: [id number] Date of Birth/Sex: Treating RN: 04-14-1971 (52 y.o. F) Primary Care Provider: Julianne Handler Other Clinician: Referring Provider: Treating Provider/Extender: Koleen Nimrod Weeks in Treatment: 100 History of Present Illness Location: medial and lateral ankle region on the right and left medial malleolus Quality: Patient reports experiencing a shooting pain to affected area(s). Severity: Patient states wound(s) are getting worse. Duration: right lower extremity bimalleolar ulcers have been present for approximately 2 years; the right medial malleolus ulcer has been there proximally 6 months Timing: Pain in wound is constant (hurts all the time) Context: The wound would happen gradually ssociated Signs and Symptoms: Patient reports having increase discharge. A HPI Description: 52 year old  patient with a history of sickle cell anemia who was last seen by me with ulceration of the right lower extremity above the ankle and was referred to Dr. Marzetta Board for a surgical debridement as I was unable to do anything in the office due to excruciating pain. At that stage she was referred from the plastic surgery service to dermatology who treated her for a skin infection with doxycycline and then Levaquin and a local antibiotic ointment. I understand the patient has since developed ulceration on the left ankle both medial and lateral and was now referred back to the wound center as dermatology has finished the management. I do not have any notes from the dermatology department Old notes: 52 year old patient with a history of sickle cell anemia, pain bilateral lower extremities, right lower extremity ulcer and has a history of receiving a skin graft( Theraskin) several months ago. She has been visiting the wound center [facility] and was seen by Dr. Leanord Hawking and Dr. Marzetta Board. after prolonged conservator therapy between July 2016 and January 2017. She had been seen by the plastic surgeon and taken to the OR for debridement and application of Theraskin. She had 3 applications of Theraskin and was then treated with collagen. Prior to that she had a history of similar problems in 2014 and was treated conservatively. Had a reflux study done for the right lower extremity in August 2016 without reflux or DVT . Past medical history significant for sickle cell disease, anemia, leg ulcers, cholelithiasis,and has never been a smoker. Once the patient was discharged on the wound center she says within 2 or 3 weeks the problems recurred and she has been treating it conservatively. since I saw her 3 weeks ago at [facility] she has been unable to get her dressing material but has completed a course of doxycycline. 6/7/ 2017 -- lower extremity venous duplex reflux evaluation was done No evidence of SVT or DVT in the  RLL. No venous incompetence in the RLL. No further  vascular workup is indicated at this time. She was seen by Dr. Mina Marble, on 10/04/2015. She agreed with the plan of taking her to the OR for debridement and application of theraskin and would also take biopsies to rule out pyoderma gangrenosum. GRABRIELA, SCHELLER ([id number]) [id number] Page 2 of 16 Follow-up note dated May 31 received and she was status post application of Theraskin to multiple ulcers around the right ankle. Pathology did not show evidence of malignancy or pyoderma gangrenosum. She would continue to see as in the wound clinic for further care and see Dr. Marzetta Board as needed. The patient brought the biopsy report and it was consistent with stasis ulcer no evidence of malignancy and the comment was that there was some adjacent neovascularization, fibrosis and patchy perivascular chronic inflammation. 11/15/2015 -- today we applied her first application of Theraskin 11/30/15; TheraSkin #2 12/13/2015 -- she is having a lot of pain locally and is here for possible application of a theraskin today. 01/16/2016 -- the patient has significant pain and has noticed despite in spite of all local care and oral pain medication. It is impossible to debride her in the office. 02/06/2016 -- I do not see any notes from Dr. Leta Baptist( the patient has not made a call to the office know as she heard from them) and the only visit to recently was with her PCP Dr. Gypsy Decant -- I saw her on 01/16/2016 and prescribed 90 tablets of oxycodone 10 mg and did lab work and screening for HIV. the HIV was negative and hemoglobin was 6.3 with a WBC count of 14.9 and hematocrit of 17.8 with platelets of 561. 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 The bimalleolar ulcers to the right lower extremity have been  present for approximately 2 years, the left medial malleolus ulcer has been present approximately 6 months. She has followed with Dr.Thimmappa in the past and has had a total of 3 applications of Theraskin (01/2015, 09/2015, 06/17/16). She has also followed with Dr. Meyer Russel here in the clinic and has received 2 applications of TheraSkin (11/10/15, 11/30/15). The patient does experience chronic, and is not amenable to debridement. 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 She is not currently on any antibiotic therapy and has not been treated with any recently. 07/17/2016 -- was seen by Dr. Leta Baptist of plastic surgery who saw her 2 weeks postop application of Theraskin #3. She had removed her dressing and asked her to apply silver alginate on alternate days and follow-up back with the wound center. Future debridements and application of skin substitute would have to be done in the hospital due to her high risk for anesthesia. READMISSION 04/17/2021 Patient is now a 52 year old woman that we have had in this clinic for a prolonged period of time and 2016-2017 and then again for 2 visits in February 2018. At that point she had wounds on the right lower leg predominantly medial. She had also been seen by plastic surgery Dr. Marzetta Board who I believe took her to the OR for operative debridement and application of TheraSkin in 2017. After she left our clinic she was followed for a very prolonged period of time in the wound care center in [facility] who then referred her ultimately to [facility] where she was seen by Dr. Mardene Speak. Again taken her to the OR for skin grafting which apparently did  not take. She had multiple other attempts at dressings although I have not really looked over all of these notes in great detail. She has not been seen in a wound care center in about a year. She states over the last year in addition to her right lower leg she has developed wounds on the  left lower leg quite extensive. She is using Xeroform to all of these wounds without really any improvement. She also has Medicaid which does not cover wound products. The patient has had vascular work-ups in the past including most recently on 03/28/2021 showing biphasic waveforms on the right triphasic at the PTA and biphasic at the dorsalis pedis on the left. She was unable to tolerate any degree of compression to do ABIs. Unfortunately TBI's were also not done. She had venous reflux studies done in 2017. This did not show any evidence of a DVT or SVT and no venous incompetence was noted in the right leg at the time this was the only side with the wound As noted I did not look all over her old records. She apparently had a course of HBO and Baptist although I am not sure what the indication would have been. In any case she developed seizures and terminated treatment earlier. She is generally much more disabled than when we last saw her in clinic. She can no longer walk pretty much wheelchair-bound because predominantly of pain in the left hip. 04/24/2021; the patient tolerated the wraps we put on. We used Santyl and Hydrofera Blue under compression. I brought her back for a nurse visit for a change in dressing. With Medicaid we will have a hard time getting anything paid for and hence the need for compression. She arrives in clinic with all the wounds looking somewhat better in terms of surface 12/20; circumferential wound on the right from the lateral to the medial. She has open areas on the left medial and left lateral x2 on all of this with the same surface. This does not look completely healthy although she does have some epithelialization. She is not complaining of a lot of pain which is unusual for her sickle ulcers. I have not looked over her extensive records from [facility]. She had recent arterial studies and has a history of venous reflux studies I will need to look these over although I do  not believe she has significant arterial disease 2023 05/22/2021; patient's wound areas measure slightly smaller. Still a lot of drainage coming from the right we have been using Hydrofera Blue and Santyl with some improvement in the wound surfaces. She tells me she will be getting transfused later in the week for her underlying sickle cell anemia I have looked over her recent arterial studies which were done in the fall. This was in November and showed biphasic and triphasic waveforms but she could not tolerate ABIs because of pressure and unfortunately TBI's were not done. She has not had recent venous reflux studies that I can see 1/10; not much change about the same surface area. This has a yellowish surface to it very gritty. We have been using Santyl and Hydrofera Blue for a prolonged period. Culture I did last week showed methicillin sensitive staph aureus "rare". Our intake nurse reports greenish drainage which may be the Hydrofera Blue itself 1/17; wounds are continue to measure smaller although I am not sure about the accuracy here. Especially the areas on the right are covered in what looks to be a nonviable surface although she  does have some epithelialization. Similarly she has areas on the left medial and left lateral ankle area which appear to have a better surface and perhaps are slightly smaller. We have been using Santyl and Hydrofera Blue. She cannot tolerate mechanical debridements She went for her reflux studies which showed significant reflux at the greater saphenous vein at the saphenofemoral junction as well as the greater saphenous vein in the proximal calf on the left she had reflux in the thigh and the common femoral vein and supra vein Fishel vein reflux in the greater saphenous vein. I will have vein and vascular look at this. My thoughts have been that these are likely sickle wounds. I looked through her old records from [facility] wound care center and then when she  graduated to [employer] wound care center where she saw Dr. Ronda Fairly and Dr. Mardene Speak. Although I can see she had reflux studies done I do not see that she actually saw a vein and vascular. I went over the fact that she had operative debridements and actual skin grafting that did not take. I do not think these wounds have ever really progressed towards healing 1/31;Substantial wounds on the right ankle area. Hyper granulated very gritty adherent debris on the surface. 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 On the left she has no DVT or SVT deep vein reflux in the common femoral vein. Superficial vein reflux in the greater saphenous vein on although the vein diameter is not really all that large. I do not think there is anything that can be done with these although I am going to send her for consultation to vein and vascular. 2/7; Wound exam; substantial wound area on the right posterior ankle area and areas on the left medial ankle and left lateral ankle. I was able to debride the left medial ankle last week fairly aggressively and it is back this week to a completely nonviable surface She will see vascular surgery this Friday and I would like them to review the venous studies and also any comments on her arterial status. If they do not see an issue here I am going to refer her to plastic surgery for an operative debridement perhaps intraoperative ACell or Integra. Eventually she will require a deep [name], [name] ([id number]) [id number] Page 3 of 16 tissue culture again 2/14; substantial wound area on the  right posterior ankle, medial ankle. We have been using silver alginate The patient was seen by vein and vascular she had both venous reflux studies and arterial studies. 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 She had no evidence of superficial venous thrombosis. She did have superficial vein reflux at the saphenofemoral junction and the greater saphenous vein. On the left no evidence of a DVT no evidence of superficial venous throat thrombosis she did have deep vein reflux in the common femoral vein and superficial vein reflux in the greater saphenous vein but these were not felt to be amenable to ablation. In terms of arterial studies she had triphasic and wife  biphasic wave waveforms bilaterally not felt to have a significant arterial issue. I do not get the feeling that they felt that any part of her nonhealing wounds were related to either arterial or venous issues. They did note that she had venous reflux at the right at the [facility] and [facility]. And also on the left there were reflux in the deep system at the common femoral vein and greater saphenous vein in the proximal thigh. Nothing amenable to ablation. 2/20; she is making some decent progress on the right where there is nice skin between the 2 open areas on the right ankle. The surfaces here do not look viable yet there is some surrounding epithelialization. She still has a small area on the left medial ankle area. Hyper-granulated Jody's away always 2/28 patient has an appointment with plastic surgery on 3/8. We will see her back on 3/9. She may have to call us to get the area redressed. We've been using Santyl under silver alginate. We made a nice improvement on the left medial ankle. The larger wounds on the right also looks somewhat better in terms of epithelialization although I think they could benefit from an aggressive debridement if plastic surgery would be  willing to do that. Perhaps placement of Integra or a cell 07/26/2021: She saw Dr. Arita Miss yesterday. He raised the question as to whether or not this might be pyoderma and wanted to wait until that question was answered by dermatology before proceeding with any sort of operative debridement. We have continue to use Santyl under silver alginate with Kerlix and Coban wraps. Overall, her wounds appear to be continuing to contract and epithelialize, with some granulation tissue present. There continues to be some slough on all wound surfaces. 08/09/2021: She has not been able to get an appointment with dermatology because apparently the offices in [facility] and [place] do not accept Medicaid. She is looking into whether or not she can be seen at the main [facility] dermatology clinic. This is necessary because plastic surgery is concerned that her wounds might represent pyoderma and they did not want to do any procedure until that was clarified. We have been using Santyl under silver alginate with Kerlix and Coban wraps. Today, there was a greater amount of drainage on her dressings with a slight green discoloration and significant odor. Despite this, her wounds continue to contract and epithelialize. There is pale granulation tissue present and actually, on the left medial ankle, the granulation tissue is a bit hypertrophic. 08/16/2021: Last week, I took a culture and this grew back rare methicillin-resistant Staph aureus and rare corynebacterium. The MRSA was sensitive to gentamicin which we began applying topically on an empiric basis. This week, her wounds are a bit smaller and the drainage and odor are less. Her primary care provider is working on assisting the patient with a dermatology evaluation. She has been in silver alginate over the gentamicin that was started last week along with Kerlix and Coban wraps. 08/23/2021: Because she has Medicaid, we have been unable to get her into see any  dermatologist in the [place] to rule out pyoderma gangrenosum, which was a requirement from plastic surgery prior to any sort of debridement and grafting. Despite this, however, all of her wounds continue to get smaller. The wound on her left medial ankle is nearly closed. There is no odor from the wounds, although she still accumulates a modest amount of drainage on her dressings. 08/30/2021: The lateral right ankle wound and the  medial left ankle wound are a bit smaller today. The medial right ankle wound is about the same size. They are less tender. We have still been unable to get her into dermatology. 09/06/2021: All of the wounds are about the same size today. She continues to endorse minimal pain. I communicated with Dr. Arita Miss in plastic surgery regarding our issues getting a dermatology appointment; he was out of town but indicated that he would look into perhaps performing the biopsy in his office and will have his office contact her. 09/14/2021: The patient has an appointment in dermatology, but it is not until October. Her wounds are roughly the same; she continues to have very thick purulent-looking drainage on her dressings. 09/20/2021: The left medial wound is nearly closed and just has a bit of accumulated eschar on the surface. The right medial and lateral ankle wounds are perhaps a little bit smaller. They continue to have a very pale surface with accumulation of thin slough. 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 I did not think Jodie Echevaria was indicated based on this. She is getting topical mupirocin with Prisma silver collagen. 10/04/2021: The patient was not seen in clinic last week due to childcare coverage issues. In the interim, the left medial leg wound has closed. The right sided leg wounds are smaller. There is more granulation tissue coming through, particularly on the lateral wound. The surface remains somewhat gritty. We have been applying topical  mupirocin and Prisma silver collagen. 10/11/2021: The left medial leg wound remains closed. She does complain of some anesthetic sensation to the area. Both of the right-sided leg wounds are smaller but still have accumulated slough. 10/18/2021: Both right-sided leg wounds are minimally smaller this week. She still continues to accumulate slough and has thick drainage on her dressings. 10/23/2021: Both wounds continue to contract. There is still slough buildup. She has been approved for a keratin-based skin substitute trial product but it will not be available until next week. 10/30/2021: The wounds are about the same to perhaps slightly smaller. There is still continued slough buildup. Unfortunately, the rep for the keratin based product did not show up today and did not answer his phone when called. 11/08/2021: The wounds are little bit smaller today. She continues to have thick drainage but the surfaces are relatively clean with just a little bit of slough accumulation. She reported to me today that she is unable to completely flex her left ankle and on examination it seems this is potentially related to scar tissue from her wounds. We do have the ProgenaMatrix trial product available for her today. 11/15/2021: Both wounds are smaller today. There is some slough accumulation on the surfaces, but the medial wound, in particular looks like it is filling in and is less deep. She did hear from physical therapy and she is going to start working with them on July 11. She is here for her second application of the trial skin substitute, ProgenaMatrix. 11/22/2021: Both wounds continue to contract, the medial more dramatically than the lateral. Both wounds have a layer of slough on the surface, but underneath this, the gritty fibrous tissue has a little bit more of a pink cast to it rather than being as pale as it has been. 11/29/2021: The wounds are roughly the same size this week, perhaps a millimeter or 2 smaller.  The medial wound has filled in and is nearly flush with the surrounding skin surface. She continues to have a lot of  slough accumulation on both surfaces. 12/06/2021: No significant change to her wounds, but she has a new opening on her dorsal foot, just distal to the right lateral ankle wound. The area on her left medial ankle that reopened looks a little bit larger today. She has quite a bit of pain associated with the new wound. 12/12/2021: Her wounds look about the same but the new opening on her right lateral dorsal foot is a little bit bigger. She continues to have a fair amount of pain with this wound. 12/26/2021: The left medial ankle wound is tiny and superficial. She has 2 areas of crusting on her left lateral ankle, however, that appear to be threatening to [name], [name] ([id number]) [id number] Page 4 of 16 open again. Her right medial ankle wound is a little bit smaller today but still continues to accumulate thick rubbery slough. The new dorsal foot wound is exquisitely painful but there is no odor or purulent drainage. No erythema or induration. The right lateral ankle wound looks about the same today, again with thick rubbery slough. 01/03/2022: The left medial ankle wound has closed again. Both right ankle wounds appear to be about the same size with thick rubbery slough. The dorsal foot wound on the right continues to be quite painful and she stated that she did not want any debridement of that site today. 01/10/2022: No real change to any of her wounds. She continues to accumulate thick slough. The dorsal foot wound has merged with the lateral malleolar wound. She is experiencing significant pain in the dorsal foot portion of the ulcer. 01/16/2022: Absolutely no change or progress in her wounds. 01/24/2022: Her wounds are unchanged. She continues to build up slough and the wounds on her dorsal right foot are still exquisitely tender. 01/31/2022: The wounds  actually measure a little bit narrower today. They still have thick slough on the surface but the underlying tissue seems a little less fibrotic. We changed to Iodosorb last week. 02/07/2022: Wounds continue to slowly epithelialize around the parameters. She has less pain today. She still accumulates a fairly substantial layer of slough. 02/15/2022: No change to the wounds overall. The measured a little bit larger today per the intake nurse. She continues to have substantial slough accumulation and her pain is a little bit worse. 02/21/2022. No change at all to her wounds. I do not see that plastic surgery has received her referral yet. 02/28/2022: The wounds remain unchanged. They are dry and fibrotic with accumulation of slough and eschar. She did receive an appointment to see plastic surgery on October 23, but the office called her back and indicated they needed to reschedule and that the next available appointment was not until December. The patient became angry and decided she did not want to see this plastics group. 10/19; the patient sees Dr. Maylene Roes again next week. She is using Medihoney as the primary dressing changing this herself. 03/21/2022: She saw Dr. Ferd Hibbs at the wound care center at [facility]. Dr. Ferd Hibbs was also unable to debride her, secondary to pain and is planning to take her to the operating room for operative debridement and potential skin substitute placement. Her wounds are unchanged with thick yellow slough and a fibrotic base. She says they are too painful to be debrided. In addition, yesterday her hemoglobin was 4 and she received 2 units packed red blood cell transfusion. 04/04/2022: Her operation at [facility] is scheduled for December 15. She continues to use Medihoney on her  wounds. They are a little bit less painful today and she is willing to entertain the possibility of debridement. The wounds measured a little bit larger in all dimensions  today but the layer of slough is not as thick as usual. 04/18/2022: Her wounds actually measured a little bit smaller today and the extension onto the dorsal foot from the lateral wound has healed. They are less tender and there is significantly less slough present as compared to prior visits. 05/09/2022: She had to reschedule her surgery due to lack of childcare. It is now planned for January 5. The wounds are basically unchanged, but she had a lot more drainage, which was thick and somewhat purulent in appearance. No significant odor. 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 They are quite painful today and she does not want to have debridement performed. 05/30/2022: The patient underwent surgical debridement and placement of TheraSkin to her wounds on January 5. She has been doing well and does not endorse any pain. The TheraSkin is intact and looks beautiful. It is sutured in place. There is no exudate or significant drainage. 06/04/2022: Her TheraSkin remains intact and I am starting to see granulation tissue emerging underneath the surface. No concern for infection. 06/12/2022: The TheraSkin is intact and there is more granulation tissue emerging. It is starting to look a little bit dry, however. No malodor or purulent drainage. 06/19/2022: The TheraSkin continues to look good. The edges are a bit dry, but the central portion of each of her wounds is showing a nice pink color. 06/27/2022: The TheraSkin on the lateral wound remains almost completely intact. There is nice pink tissue peaking through the mesh of the TheraSkin. On the medial leg, it is starting to breakdown a little bit, but most of the TheraSkin remains intact here, as well. Both wounds measured smaller today. 07/03/2022: The wounds are fairly dry around the perimeter today. Some of the suture is hanging loose. 07/12/2022: Both wounds are measuring smaller today. The medial ulcer is getting a bit too dry. The lateral ulcer  continues to have nice buds of granulation tissue emerging. 07/26/2022: The moisture balance on both wounds is much better today. For the first time since I began seeing her, I see actual beefy red granulation tissue on the lateral ankle wound. There are more buds of deep pink granulation tissue emerging on the medial ankle. There is some dry eschar around the edges of the medial wound and a tiny amount of slough overlying the good granulation tissue on the lateral wound. 08/09/2022: Unfortunately, she has a new wound on her left lateral lower leg, just above the ankle. It is extremely painful for her. It penetrates to the fat layer and has the same woody, fibrotic character as her previous wounds. The other wounds are both a little bit smaller with slough and some eschar around the periphery. 08/14/2022: Her right lateral ankle wound is a little bit smaller. Unfortunately, she has had an increase in her drainage and it has a purulent nature to it, similar to what was present prior to her surgical debridement. She also reports odor coming from her wounds this week. The left lateral leg wound remains extremely tender. 08/21/2022: The left lateral ankle wound is larger and more painful today. Both right ankle wounds have a little bit more vital appearance, but they have accumulated more slough. She reports less odor this week. She is currently taking Augmentin. 08/28/2022: The left lateral ankle wound continues to enlarge and remains  quite painful. Both of the right ankle wounds actually look better this week. There is improved tissue quality with less slough accumulation. 09/27/2022: The left lateral ankle wound actually looks nearly healed. She says that she has been applying some silver alginate that she had leftover. She was unable to afford the Santyl that was prescribed, but found a tube that she had on hand at home and has been using this on her right ankle wounds. These wounds look about the same and  have accumulated the usual layer of slough. Unfortunately, a portion of the dorsal foot wound has reopened and is exquisitely painful today. 10/09/2022: The left lateral ankle wound is healed. The dorsal foot wound appears to have expanded and remains exquisitely painful. The medial and lateral right ankle wounds look about the same size, but they are quite a bit cleaner. 10/23/2022: The left lateral ankle remains closed. The dorsal foot wound has expanded to the point that it now converges with the right lateral leg wound. She has accumulated fairly thick slough on all of the open surfaces. [name], [name] ([id number]) [id number] Page 5 of 16 11/06/2022: No significant change to any of her wounds, although the dorsal foot aspect of the right lateral wound is a little bit smaller. She has been approved for the [employer] and [employer] for [employer] and will be able to get a full year's supply. 11/20/2022: The dorsal foot aspect of the right lateral wound has almost completely closed. I am seeing nice pink tissue starting to emerge from the fibrotic surface of both wounds. The medial leg wound appears to be a little bit smaller today. She has been using Santyl. 12/04/2022: The dorsal foot aspect of the right lateral wound has closed. There is more pink granulation tissue emerging on her wounds. There are buds of epithelium beginning to fill in from the edges of her wound, particularly at the proximal aspect of the medial wound and the distal aspect of the lateral wound. Significantly less slough formation than in the past. 12/18/2022: The wounds measured about the same size today, but there is better granulation tissue filling in and the overall surface consistency is improving. She does have slough accumulation on the surfaces, but no malodor or purulent drainage. 01/01/2023: No significant change to her wounds. The surface seems a little bit more fibrotic today. She is  complaining of more pain. 01/15/2023: Her wounds are stable today. She has been taking Augmentin based on the culture that I took at her previous visit. She says that she has felt physically better since being on the antibiotic and has had less drainage from her wounds. 01/29/2023: The wounds are unchanged and remained fibrotic and painful. She does report having less pain while on antibiotics and less drainage, as well. 02/07/2023: Her wounds have gotten larger and more painful. She completed her course of antibiotics yesterday. She still has a fair amount of drainage coming from her wounds. 02/24/2023: Her wounds continue to enlarge. They are more painful. 03/18/2023: Her wounds continue to enlarge and become more painful. The systemic antibiotics do not seem to have made any difference this round. She is having more drainage, as well. Electronic Signature(s) Signed: 03/18/2023 10:39:19 AM By: Duanne Guess MD FACS Entered By: Duanne Guess on 03/18/2023 10:39:19 -------------------------------------------------------------------------------- Physical Exam Details Patient Name: Date of Service: [name], [name] 03/18/2023 10:00 A M Medical Record Number: [id number] Patient Account Number: [id number] Date of Birth/Sex: Treating RN: 12/28/1970 (52 y.o. F) Primary Care Provider:  Julianne Handler Other Clinician: Referring Provider: Treating Provider/Extender: Koleen Nimrod Weeks in Treatment: 100 Constitutional . . . . no acute distress. Respiratory Normal work of breathing on room air.. Notes 03/18/2023: Her wounds continue to enlarge and become more painful. The systemic antibiotics do not seem to have made any difference this round. She is having more drainage, as well. Electronic Signature(s) Signed: 03/18/2023 10:39:53 AM By: Duanne Guess MD FACS Entered By: Duanne Guess on 03/18/2023  10:39:53 -------------------------------------------------------------------------------- Physician Orders Details Patient Name: Date of Service: [name], [name] 03/18/2023 10:00 A M Medical Record Number: [id number] Patient Account Number: [id number] Date of Birth/Sex: Treating RN: 02-Apr-1971 (52 y.o. [name] Primary Care Provider: Julianne Handler Other Clinician: Referring Provider: Treating Provider/Extender: Koleen Nimrod Weeks in Treatment: 100 [place], [place] ([id number]) [id number] Page 6 of 16 The following information was scribed by: Samuella Bruin The information was scribed for: Duanne Guess Verbal / Phone Orders: No Diagnosis Coding ICD-10 Coding Code Description L97.818 Non-pressure chronic ulcer of other part of right lower leg with other specified severity 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 Follow-up Appointments ppointment in 2 weeks. - Dr. Lady Gary Rm 2 Return A Bathing/ Shower/ Hygiene May shower and wash wound with soap and water. Additional Orders / Instructions Follow Nutritious Diet Juven Shake 1-2 times daily. Lymphedema Treatment Plan - Exercise, Compression and Elevation Elevate legs 30 - 60 minutes at or above heart level at least 3 - 4 times daily as able/tolerated Avoid standing for long periods and elevate leg(s) parallel to the floor when sitting Wound Treatment Wound #17 - Lower Leg Wound Laterality: Right, Lateral Cleanser: Soap and Water 1 x Per Week/30 Days Discharge Instructions: May shower and wash wound with dial antibacterial soap and water prior to dressing change. Cleanser: Wound Cleanser 1 x Per Week/30 Days Discharge Instructions: Cleanse the wound with wound cleanser prior to applying a clean dressing using gauze sponges, not tissue or cotton balls. Peri-Wound Care: Triamcinolone 15 (g) 1 x Per Week/30 Days Discharge Instructions: Use triamcinolone 15 (g) as  directed Peri-Wound Care: Sween Lotion (Moisturizing lotion) 1 x Per Week/30 Days Discharge Instructions: Apply moisturizing lotion as directed Topical: Gentamicin 1 x Per Week/30 Days Discharge Instructions: As directed by physician Topical: Mupirocin Ointment 1 x Per Week/30 Days Discharge Instructions: Apply Mupirocin (Bactroban) as instructed Prim Dressing: Santyl Ointment 1 x Per Week/30 Days ary Discharge Instructions: Apply nickel thick amount to wound bed as instructed Secondary Dressing: ABD Pad, 5x9 1 x Per Week/30 Days Discharge Instructions: Apply over primary dressing as directed. Secondary Dressing: Woven Gauze Sponge, Non-Sterile 4x4 in 1 x Per Week/30 Days Discharge Instructions: Moisten with normal saline and Apply over primary dressing as directed. Secured With: Elastic Bandage 4 inch (ACE bandage) 1 x Per Week/30 Days Discharge Instructions: Secure with ACE bandage as directed. Secured With: [employer], 4.5x3.1 (in/yd) 1 x Per Week/30 Days Discharge Instructions: Secure with Kerlix as directed. Wound #21 - Ankle Wound Laterality: Right, Medial Cleanser: Soap and Water 1 x Per Week/30 Days Discharge Instructions: May shower and wash wound with dial antibacterial soap and water prior to dressing change. Cleanser: Wound Cleanser 1 x Per Week/30 Days Discharge Instructions: Cleanse the wound with wound cleanser prior to applying a clean dressing using gauze sponges, not tissue or cotton balls. Peri-Wound Care: Triamcinolone 15 (g) 1 x Per Week/30 Days Discharge Instructions: Use triamcinolone 15 (g) as directed Peri-Wound Care: Sween Lotion (Moisturizing lotion) 1 x Per Week/30  Days Discharge Instructions: Apply moisturizing lotion as directed Topical: Gentamicin 1 x Per Week/30 Days Discharge Instructions: As directed by physician [name] ([id number]) [id number] Page 7 of 16 Topical: Mupirocin Ointment 1 x Per Week/30  Days Discharge Instructions: Apply Mupirocin (Bactroban) as instructed Prim Dressing: Santyl Ointment 1 x Per Week/30 Days ary Discharge Instructions: Apply nickel thick amount to wound bed as instructed Secondary Dressing: ABD Pad, 5x9 1 x Per Week/30 Days Discharge Instructions: Apply over primary dressing as directed. Secondary Dressing: Woven Gauze Sponge, Non-Sterile 4x4 in 1 x Per Week/30 Days Discharge Instructions: Moisten with normal saline and Apply over primary dressing as directed. Secured With: Elastic Bandage 4 inch (ACE bandage) 1 x Per Week/30 Days Discharge Instructions: Secure with ACE bandage as directed. Secured With: [employer], 4.5x3.1 (in/yd) 1 x Per Week/30 Days Discharge Instructions: Secure with Kerlix as directed. Patient Medications llergies: No Known Allergies A Notifications Medication Indication Start End 03/18/2023 gentamicin DOSE topical 0.1 % ointment - Apply to wound with dressing changes as directed 03/18/2023 mupirocin DOSE topical 2 % ointment - Apply to wound with dressing changes as directed Electronic Signature(s) Signed: 03/18/2023 10:45:17 AM By: Duanne Guess MD FACS Previous Signature: 03/18/2023 10:05:43 AM Version By: Duanne Guess MD FACS Entered By: Duanne Guess on 03/18/2023 10:40:15 -------------------------------------------------------------------------------- Problem List Details Patient Name: Date of Service: [name], [name] 03/18/2023 10:00 A M Medical Record Number: [id number] Patient Account Number: [id number] Date of Birth/Sex: Treating RN: [birth year] (52 y.o. F) Primary Care Provider: Julianne Handler Other Clinician: Referring Provider: Treating Provider/Extender: Koleen Nimrod Weeks in Treatment: 100 Active Problems ICD-10 Encounter Code Description Active Date MDM Diagnosis L97.818 Non-pressure chronic ulcer of other part of right lower leg with other specified 04/17/2021  No Yes severity 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 04/17/2021 No Yes Inactive Problems ICD-10 Code Description Active Date Inactive Date L97.828 Non-pressure chronic ulcer of other part of left lower leg with other specified severity 04/17/2021 04/17/2021 [name] ([id number]) [id number] Page 8 of 16 Resolved Problems ICD-10 Code Description Active Date Resolved Date L97.822 Non-pressure chronic ulcer of other part of left lower leg with fat layer exposed 08/09/2022 08/09/2022 Electronic Signature(s) Signed: 03/18/2023 10:36:40 AM By: Duanne Guess MD FACS Entered By: Duanne Guess on 03/18/2023 10:36:40 -------------------------------------------------------------------------------- Progress Note Details Patient Name: Date of Service: [name], [name] 03/18/2023 10:00 A M Medical Record Number: [id number] Patient Account Number: [id number] Date of Birth/Sex: Treating RN: 07/03/1970 (52 y.o. F) Primary Care Provider: Julianne Handler Other Clinician: Referring Provider: Treating Provider/Extender: Koleen Nimrod Weeks in Treatment: 100 Subjective Chief Complaint Information obtained from Patient the patient is here for evaluation of her bilateral lower extremity sickle cell ulcers 04/17/2021; patient comes in for substantial wounds on the right and left lower leg History of Present Illness (HPI) The following HPI elements were documented for the patient's wound: Location: medial and lateral ankle region on the right and left medial malleolus Quality: Patient reports experiencing a shooting pain to affected area(s). Severity: Patient states wound(s) are getting worse. Duration: right lower extremity bimalleolar ulcers have been present for approximately 2 years; the right medial malleolus ulcer has been there proximally 6 months Timing: Pain in wound is constant (hurts all the time) Context: The wound would happen  gradually Associated Signs and Symptoms: Patient reports having increase discharge. 52 year old patient with a history of sickle cell anemia who was last seen by me with ulceration of the right lower extremity above the  ankle and was referred to Dr. Marzetta Board for a surgical debridement as I was unable to do anything in the office due to excruciating pain. At that stage she was referred from the plastic surgery service to dermatology who treated her for a skin infection with doxycycline and then Levaquin and a local antibiotic ointment. I understand the patient has since developed ulceration on the left ankle both medial and lateral and was now referred back to the wound center as dermatology has finished the management. I do not have any notes from the dermatology department Old notes: 52 year old patient with a history of sickle cell anemia, pain bilateral lower extremities, right lower extremity ulcer and has a history of receiving a skin graft( Theraskin) several months ago. She has been visiting the wound center [facility] and was seen by Dr. Leanord Hawking and Dr. Marzetta Board. after prolonged conservator therapy between July 2016 and January 2017. She had been seen by the plastic surgeon and taken to the OR for debridement and application of Theraskin. She had 3 applications of Theraskin and was then treated with collagen. Prior to that she had a history of similar problems in 2014 and was treated conservatively. Had a reflux study done for the right lower extremity in August 2016 without reflux or DVT . Past medical history significant for sickle cell disease, anemia, leg ulcers, cholelithiasis,and has never been a smoker. Once the patient was discharged on the wound center she says within 2 or 3 weeks the problems recurred and she has been treating it conservatively. since I saw her 3 weeks ago at [facility] she has been unable to get her dressing material but has completed a course of doxycycline. 6/7/  2017 -- lower extremity venous duplex reflux evaluation was done  No evidence of SVT or DVT in the RLL. No venous incompetence in the RLL. No further vascular workup is indicated at this time. She was seen by Dr. Mina Marble, on 10/04/2015. She agreed with the plan of taking her to the OR for debridement and application of theraskin and would also take biopsies to rule out pyoderma gangrenosum. Follow-up note dated May 31 received and she was status post application of Theraskin to multiple ulcers around the right ankle. Pathology did not show evidence of malignancy or pyoderma gangrenosum. She would continue to see as in the wound clinic for further care and see Dr. Marzetta Board as needed. The patient brought the biopsy report and it was consistent with stasis ulcer no evidence of malignancy and the comment was that there was some adjacent neovascularization, fibrosis and patchy perivascular chronic inflammation. 11/15/2015 -- today we applied her first application of Theraskin 11/30/15; TheraSkin #2 12/13/2015 -- she is having a lot of pain locally and is here for possible application of a theraskin today. 01/16/2016 -- the patient has significant pain and has noticed despite in spite of all local care and oral pain medication. It is impossible to debride her in the office. 02/06/2016 -- I do not see any notes from Dr. Leta Baptist( the patient has not made a call to the office know as she heard from them) and the only visit to recently was with her PCP Dr. Gypsy Decant -- I saw her on 01/16/2016 and prescribed 90 tablets of oxycodone 10 mg and did lab work and screening for HIV. the HIV was negative and hemoglobin was 6.3 with a WBC count of 14.9 and hematocrit of 17.8 with platelets of 561. reticulocyte count was 15.5% READMISSIONURSULA, [name] ([id number])  [phone].pdf Page 9 of 16 07/10/2016- The patient is here for readmission for bilateral lower extremity ulcers in  the presence of sickle cell. The bimalleolar ulcers to the right lower extremity have been present for approximately 2 years, the left medial malleolus ulcer has been present approximately 6 months. She has followed with Dr.Thimmappa in the past and has had a total of 3 applications of Theraskin (01/2015, 09/2015, 06/17/16). She has also followed with Dr. Meyer Russel here in the clinic and has received 2 applications of TheraSkin (11/10/15, 11/30/15). The patient does experience chronic, and is not amenable to debridement. 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 She is not currently on any antibiotic therapy and has not been treated with any recently. 07/17/2016 -- was seen by Dr. Leta Baptist of plastic surgery who saw her 2 weeks postop application of Theraskin #3. She had removed her dressing and asked her to apply silver alginate on alternate days and follow-up back with the wound center. Future debridements and application of skin substitute would have to be done in the hospital due to her high risk for anesthesia. READMISSION 04/17/2021 Patient is now a 52 year old woman that we have had in this clinic for a prolonged period of time and 2016-2017 and then again for 2 visits in February 2018. At that point she had wounds on the right lower leg predominantly medial. She had also been seen by plastic surgery Dr. Marzetta Board who I believe took her to the OR for operative debridement and application of TheraSkin in 2017. After she left our clinic she was followed for a very prolonged period of time in the wound care center in [facility] who then referred her ultimately to [facility] where she was seen by Dr. Mardene Speak. Again taken her to the OR for skin grafting which apparently did not take. She had multiple other attempts at dressings although I have not really looked over all of these notes in great detail. She has not been seen in a wound care center in about a year. She  states over the last year in addition to her right lower leg she has developed wounds on the left lower leg quite extensive. She is using Xeroform to all of these wounds without really any improvement. She also has Medicaid which does not cover wound products. The patient has had vascular work-ups in the past including most recently on 03/28/2021 showing biphasic waveforms on the right triphasic at the PTA and biphasic at the dorsalis pedis on the left. She was unable to tolerate any degree of compression to do ABIs. Unfortunately TBI's were also not done. She had venous reflux studies done in 2017. This did not show any evidence of a DVT or SVT and no venous incompetence was noted in the right leg at the time this was the only side with the wound As noted I did not look all over her old records. She apparently had a course of HBO and Baptist although I am not sure what the indication would have been. In any case she developed seizures and terminated treatment earlier. She is generally much more disabled than when we last saw her in clinic. She can no longer walk pretty much wheelchair-bound because predominantly of pain in the left hip. 04/24/2021; the patient tolerated the wraps we put on. We used Santyl and Hydrofera Blue under compression. I brought her back for a nurse visit for a change in dressing. With Medicaid we will  have a hard time getting anything paid for and hence the need for compression. She arrives in clinic with all the wounds looking somewhat better in terms of surface 12/20; circumferential wound on the right from the lateral to the medial. She has open areas on the left medial and left lateral x2 on all of this with the same surface. This does not look completely healthy although she does have some epithelialization. She is not complaining of a lot of pain which is unusual for her sickle ulcers. I have not looked over her extensive records from [facility]. She had recent arterial  studies and has a history of venous reflux studies I will need to look these over although I do not believe she has significant arterial disease 2023 05/22/2021; patient's wound areas measure slightly smaller. Still a lot of drainage coming from the right we have been using Hydrofera Blue and Santyl with some improvement in the wound surfaces. She tells me she will be getting transfused later in the week for her underlying sickle cell anemia I have looked over her recent arterial studies which were done in the fall. This was in November and showed biphasic and triphasic waveforms but she could not tolerate ABIs because of pressure and unfortunately TBI's were not done. She has not had recent venous reflux studies that I can see 1/10; not much change about the same surface area. This has a yellowish surface to it very gritty. We have been using Santyl and Hydrofera Blue for a prolonged period. Culture I did last week showed methicillin sensitive staph aureus "rare". Our intake nurse reports greenish drainage which may be the Hydrofera Blue itself 1/17; wounds are continue to measure smaller although I am not sure about the accuracy here. Especially the areas on the right are covered in what looks to be a nonviable surface although she does have some epithelialization. Similarly she has areas on the left medial and left lateral ankle area which appear to have a better surface and perhaps are slightly smaller. We have been using Santyl and Hydrofera Blue. She cannot tolerate mechanical debridements She went for her reflux studies which showed significant reflux at the greater saphenous vein at the saphenofemoral junction as well as the greater saphenous vein in the proximal calf on the left she had reflux in the thigh and the common femoral vein and supra vein Fishel vein reflux in the greater saphenous vein. I will have vein and vascular look at this. My thoughts have been that these are likely sickle  wounds. I looked through her old records from [facility] wound care center and then when she graduated to [facility] wound care center where she saw Dr. Ronda Fairly and Dr. Mardene Speak. Although I can see she had reflux studies done I do not see that she actually saw a vein and vascular. I went over the fact that she had operative debridements and actual skin grafting that did not take. I do not think these wounds have ever really progressed towards healing 1/31;Substantial wounds on the right ankle area. Hyper granulated very gritty adherent debris on the surface. 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 On the left she has no DVT or SVT deep vein reflux in the common femoral vein. Superficial vein reflux in the greater saphenous vein on although the vein diameter is not really all that large. I do not think there is anything that can be done with these although I am going to send her for consultation to vein and vascular. 2/7; Wound exam; substantial wound area on the right posterior ankle area and areas on the left medial ankle and left lateral ankle. I was able to debride the left medial ankle last week fairly aggressively and it is back this week to a completely nonviable surface She will see vascular surgery this Friday and I would like them to review the venous studies and also any comments on her arterial status. If they do not see an issue here I am going to refer her to plastic surgery for an operative debridement perhaps intraoperative ACell or Integra. Eventually she will require a deep tissue culture again 2/14; substantial wound  area on the right posterior ankle, medial ankle. We have been using silver alginate The patient was seen by vein and vascular she had both venous reflux studies and arterial studies. 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 She had no evidence of superficial venous thrombosis. She did have superficial vein reflux at the saphenofemoral junction and the greater saphenous vein. On the left no evidence of a DVT no evidence of superficial venous throat thrombosis she did have deep vein reflux in the common femoral vein and superficial vein reflux in the greater saphenous vein but these were not felt to be amenable to ablation. In terms of arterial studies she had triphasic and wife biphasic wave waveforms bilaterally not felt to have a significant arterial issue. I do not get the feeling that they felt that any part of her nonhealing wounds were related to either arterial or venous issues. They did note that she had venous reflux at the right at the [facility] and [facility]. And also on the left there were reflux in the deep system at the common femoral vein and greater saphenous vein in the proximal thigh. Nothing amenable to ablation. 2/20; she is making some decent progress on the right where there is nice skin between the 2 open areas on the right ankle. The surfaces here do not look [name], [name] ([id number]) [id number] Page 10 of 16 viable yet there is some surrounding epithelialization. She still has a small area on the left medial ankle area. Hyper-granulated Jody's away always 2/28 patient has an appointment with plastic surgery on 3/8. We will see her back on 3/9. She may have to call us to get the area redressed. We've been using Santyl under silver alginate. We made a nice improvement on the left medial ankle. The larger wounds on the right also looks somewhat better in terms of epithelialization  although I think they could benefit from an aggressive debridement if plastic surgery would be willing to do that. Perhaps placement of Integra or a cell 07/26/2021: She saw Dr. Arita Miss yesterday. He raised the question as to whether or not this might be pyoderma and wanted to wait until that question was answered by dermatology before proceeding with any sort of operative debridement. We have continue to use Santyl under silver alginate with Kerlix and Coban wraps. Overall, her wounds appear to be continuing to contract  and epithelialize, with some granulation tissue present. There continues to be some slough on all wound surfaces. 08/09/2021: She has not been able to get an appointment with dermatology because apparently the offices in [facility] and [place] do not accept Medicaid. She is looking into whether or not she can be seen at the main [facility] dermatology clinic. This is necessary because plastic surgery is concerned that her wounds might represent pyoderma and they did not want to do any procedure until that was clarified. We have been using Santyl under silver alginate with Kerlix and Coban wraps. Today, there was a greater amount of drainage on her dressings with a slight green discoloration and significant odor. Despite this, her wounds continue to contract and epithelialize. There is pale granulation tissue present and actually, on the left medial ankle, the granulation tissue is a bit hypertrophic. 08/16/2021: Last week, I took a culture and this grew back rare methicillin-resistant Staph aureus and rare corynebacterium. The MRSA was sensitive to gentamicin which we began applying topically on an empiric basis. This week, her wounds are a bit smaller and the drainage and odor are less. Her primary care provider is working on assisting the patient with a dermatology evaluation. She has been in silver alginate over the gentamicin that was started last week along with Kerlix and  Coban wraps. 08/23/2021: Because she has Medicaid, we have been unable to get her into see any dermatologist in the [place] to rule out pyoderma gangrenosum, which was a requirement from plastic surgery prior to any sort of debridement and grafting. Despite this, however, all of her wounds continue to get smaller. The wound on her left medial ankle is nearly closed. There is no odor from the wounds, although she still accumulates a modest amount of drainage on her dressings. 08/30/2021: The lateral right ankle wound and the medial left ankle wound are a bit smaller today. The medial right ankle wound is about the same size. They are less tender. We have still been unable to get her into dermatology. 09/06/2021: All of the wounds are about the same size today. She continues to endorse minimal pain. I communicated with Dr. Arita Miss in plastic surgery regarding our issues getting a dermatology appointment; he was out of town but indicated that he would look into perhaps performing the biopsy in his office and will have his office contact her. 09/14/2021: The patient has an appointment in dermatology, but it is not until October. Her wounds are roughly the same; she continues to have very thick purulent-looking drainage on her dressings. 09/20/2021: The left medial wound is nearly closed and just has a bit of accumulated eschar on the surface. The right medial and lateral ankle wounds are perhaps a little bit smaller. They continue to have a very pale surface with accumulation of thin slough. 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 I did not think Jodie Echevaria was indicated based on this. She is getting topical mupirocin with Prisma silver collagen. 10/04/2021: The patient was not seen in clinic last week due to childcare coverage issues. In the interim, the left medial leg wound has closed. The right sided leg wounds are smaller. There is more granulation tissue coming through, particularly on  the lateral wound. The surface remains somewhat gritty. We have been applying topical mupirocin and Prisma silver collagen. 10/11/2021: The left medial leg wound remains closed. She does complain of some anesthetic sensation to the area. Both of the right-sided leg  wounds are smaller but still have accumulated slough. 10/18/2021: Both right-sided leg wounds are minimally smaller this week. She still continues to accumulate slough and has thick drainage on her dressings. 10/23/2021: Both wounds continue to contract. There is still slough buildup. She has been approved for a keratin-based skin substitute trial product but it will not be available until next week. 10/30/2021: The wounds are about the same to perhaps slightly smaller. There is still continued slough buildup. Unfortunately, the rep for the keratin based product did not show up today and did not answer his phone when called. 11/08/2021: The wounds are little bit smaller today. She continues to have thick drainage but the surfaces are relatively clean with just a little bit of slough accumulation. She reported to me today that she is unable to completely flex her left ankle and on examination it seems this is potentially related to scar tissue from her wounds. We do have the ProgenaMatrix trial product available for her today. 11/15/2021: Both wounds are smaller today. There is some slough accumulation on the surfaces, but the medial wound, in particular looks like it is filling in and is less deep. She did hear from physical therapy and she is going to start working with them on July 11. She is here for her second application of the trial skin substitute, ProgenaMatrix. 11/22/2021: Both wounds continue to contract, the medial more dramatically than the lateral. Both wounds have a layer of slough on the surface, but underneath this, the gritty fibrous tissue has a little bit more of a pink cast to it rather than being as pale as it has  been. 11/29/2021: The wounds are roughly the same size this week, perhaps a millimeter or 2 smaller. The medial wound has filled in and is nearly flush with the surrounding skin surface. She continues to have a lot of slough accumulation on both surfaces. 12/06/2021: No significant change to her wounds, but she has a new opening on her dorsal foot, just distal to the right lateral ankle wound. The area on her left medial ankle that reopened looks a little bit larger today. She has quite a bit of pain associated with the new wound. 12/12/2021: Her wounds look about the same but the new opening on her right lateral dorsal foot is a little bit bigger. She continues to have a fair amount of pain with this wound. 12/26/2021: The left medial ankle wound is tiny and superficial. She has 2 areas of crusting on her left lateral ankle, however, that appear to be threatening to open again. Her right medial ankle wound is a little bit smaller today but still continues to accumulate thick rubbery slough. The new dorsal foot wound is exquisitely painful but there is no odor or purulent drainage. No erythema or induration. The right lateral ankle wound looks about the same today, again with thick rubbery slough. 01/03/2022: The left medial ankle wound has closed again. Both right ankle wounds appear to be about the same size with thick rubbery slough. The dorsal foot wound on the right continues to be quite painful and she stated that she did not want any debridement of that site today. 01/10/2022: No real change to any of her wounds. She continues to accumulate thick slough. The dorsal foot wound has merged with the lateral malleolar wound. She is experiencing significant pain in the dorsal foot portion of the ulcer. 01/16/2022: Absolutely no change or progress in her wounds. 01/24/2022: Her wounds are unchanged. She continues to  build up slough and the wounds on her dorsal right foot are still exquisitely  tender. [name], [name] ([id number]) [id number] Page 11 of 16 01/31/2022: The wounds actually measure a little bit narrower today. They still have thick slough on the surface but the underlying tissue seems a little less fibrotic. We changed to Iodosorb last week. 02/07/2022: Wounds continue to slowly epithelialize around the parameters. She has less pain today. She still accumulates a fairly substantial layer of slough. 02/15/2022: No change to the wounds overall. The measured a little bit larger today per the intake nurse. She continues to have substantial slough accumulation and her pain is a little bit worse. 02/21/2022. No change at all to her wounds. I do not see that plastic surgery has received her referral yet. 02/28/2022: The wounds remain unchanged. They are dry and fibrotic with accumulation of slough and eschar. She did receive an appointment to see plastic surgery on October 23, but the office called her back and indicated they needed to reschedule and that the next available appointment was not until December. The patient became angry and decided she did not want to see this plastics group. 10/19; the patient sees Dr. Maylene Roes again next week. She is using Medihoney as the primary dressing changing this herself. 03/21/2022: She saw Dr. Ferd Hibbs at the wound care center at [facility]. Dr. Ferd Hibbs was also unable to debride her, secondary to pain and is planning to take her to the operating room for operative debridement and potential skin substitute placement. Her wounds are unchanged with thick yellow slough and a fibrotic base. She says they are too painful to be debrided. In addition, yesterday her hemoglobin was 4 and she received 2 units packed red blood cell transfusion. 04/04/2022: Her operation at [facility] is scheduled for December 15. She continues to use Medihoney on her wounds. They are a little bit less painful today and she is  willing to entertain the possibility of debridement. The wounds measured a little bit larger in all dimensions today but the layer of slough is not as thick as usual. 04/18/2022: Her wounds actually measured a little bit smaller today and the extension onto the dorsal foot from the lateral wound has healed. They are less tender and there is significantly less slough present as compared to prior visits. 05/09/2022: She had to reschedule her surgery due to lack of childcare. It is now planned for January 5. The wounds are basically unchanged, but she had a lot more drainage, which was thick and somewhat purulent in appearance. No significant odor. 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 They are quite painful today and she does not want to have debridement performed. 05/30/2022: The patient underwent surgical debridement and placement of TheraSkin to her wounds on January 5. She has been doing well and does not endorse any pain. The TheraSkin is intact and looks beautiful. It is sutured in place. There is no exudate or significant drainage. 06/04/2022: Her TheraSkin remains intact and I am starting to see granulation tissue emerging underneath the surface. No concern for infection. 06/12/2022: The TheraSkin is intact and there is more granulation tissue emerging. It is starting to look a little bit dry, however. No malodor or purulent drainage. 06/19/2022: The TheraSkin continues to look good. The edges are a bit dry, but the central portion of each of her wounds is showing a nice pink color. 06/27/2022: The TheraSkin on the lateral wound remains almost completely intact. There is nice  pink tissue peaking through the mesh of the TheraSkin. On the medial leg, it is starting to breakdown a little bit, but most of the TheraSkin remains intact here, as well. Both wounds measured smaller today. 07/03/2022: The wounds are fairly dry around the perimeter today. Some of the suture is hanging  loose. 07/12/2022: Both wounds are measuring smaller today. The medial ulcer is getting a bit too dry. The lateral ulcer continues to have nice buds of granulation tissue emerging. 07/26/2022: The moisture balance on both wounds is much better today. For the first time since I began seeing her, I see actual beefy red granulation tissue on the lateral ankle wound. There are more buds of deep pink granulation tissue emerging on the medial ankle. There is some dry eschar around the edges of the medial wound and a tiny amount of slough overlying the good granulation tissue on the lateral wound. 08/09/2022: Unfortunately, she has a new wound on her left lateral lower leg, just above the ankle. It is extremely painful for her. It penetrates to the fat layer and has the same woody, fibrotic character as her previous wounds. The other wounds are both a little bit smaller with slough and some eschar around the periphery. 08/14/2022: Her right lateral ankle wound is a little bit smaller. Unfortunately, she has had an increase in her drainage and it has a purulent nature to it, similar to what was present prior to her surgical debridement. She also reports odor coming from her wounds this week. The left lateral leg wound remains extremely tender. 08/21/2022: The left lateral ankle wound is larger and more painful today. Both right ankle wounds have a little bit more vital appearance, but they have accumulated more slough. She reports less odor this week. She is currently taking Augmentin. 08/28/2022: The left lateral ankle wound continues to enlarge and remains quite painful. Both of the right ankle wounds actually look better this week. There is improved tissue quality with less slough accumulation. 09/27/2022: The left lateral ankle wound actually looks nearly healed. She says that she has been applying some silver alginate that she had leftover. She was unable to afford the Santyl that was prescribed, but found a  tube that she had on hand at home and has been using this on her right ankle wounds. These wounds look about the same and have accumulated the usual layer of slough. Unfortunately, a portion of the dorsal foot wound has reopened and is exquisitely painful today. 10/09/2022: The left lateral ankle wound is healed. The dorsal foot wound appears to have expanded and remains exquisitely painful. The medial and lateral right ankle wounds look about the same size, but they are quite a bit cleaner. 10/23/2022: The left lateral ankle remains closed. The dorsal foot wound has expanded to the point that it now converges with the right lateral leg wound. She has accumulated fairly thick slough on all of the open surfaces. 11/06/2022: No significant change to any of her wounds, although the dorsal foot aspect of the right lateral wound is a little bit smaller. She has been approved for the [employer] and [employer] for [employer] and will be able to get a full year's supply. 11/20/2022: The dorsal foot aspect of the right lateral wound has almost completely closed. I am seeing nice pink tissue starting to emerge from the fibrotic surface of both wounds. The medial leg wound appears to be a little bit smaller today. She has been using Santyl. 12/04/2022: The dorsal  foot aspect of the right lateral wound has closed. There is more pink granulation tissue emerging on her wounds. There are buds of epithelium beginning to fill in from the edges of her wound, particularly at the proximal aspect of the medial wound and the distal aspect of the lateral wound. Significantly less slough formation than in the past. 12/18/2022: The wounds measured about the same size today, but there is better granulation tissue filling in and the overall surface consistency is improving. She does have slough accumulation on the surfaces, but no malodor or purulent drainage. 01/01/2023: No significant change to her wounds. The surface seems a  little bit more fibrotic today. She is complaining of more pain. [name], [name] ([id number]) [id number] Page 12 of 16 01/15/2023: Her wounds are stable today. She has been taking Augmentin based on the culture that I took at her previous visit. She says that she has felt physically better since being on the antibiotic and has had less drainage from her wounds. 01/29/2023: The wounds are unchanged and remained fibrotic and painful. She does report having less pain while on antibiotics and less drainage, as well. 02/07/2023: Her wounds have gotten larger and more painful. She completed her course of antibiotics yesterday. She still has a fair amount of drainage coming from her wounds. 02/24/2023: Her wounds continue to enlarge. They are more painful. 03/18/2023: Her wounds continue to enlarge and become more painful. The systemic antibiotics do not seem to have made any difference this round. She is having more drainage, as well. Patient History Information obtained from Patient. Family History Diabetes - Mother, Lung Disease - Mother, No family history of Cancer, Heart Disease, Hereditary Spherocytosis, Hypertension, Kidney Disease, Seizures, Stroke, Thyroid Problems, Tuberculosis. Social History Never smoker, Marital Status - Married, Alcohol Use - Never, Drug Use - No History, Caffeine Use - Daily. Medical History Eyes Denies history of Cataracts, Glaucoma, Optic Neuritis Ear/Nose/Mouth/Throat Denies history of Chronic sinus problems/congestion, Middle ear problems Hematologic/Lymphatic Patient has history of Anemia, Sickle Cell Disease Denies history of Hemophilia, Human Immunodeficiency Virus, Lymphedema Respiratory Denies history of Aspiration, Asthma, Chronic Obstructive Pulmonary Disease (COPD), Pneumothorax, Sleep Apnea, Tuberculosis Cardiovascular Denies history of Angina, Arrhythmia, Congestive Heart Failure, Coronary Artery Disease, Deep Vein Thrombosis,  Hypertension, Hypotension, Myocardial Infarction, Peripheral Arterial Disease, Peripheral Venous Disease, Phlebitis, Vasculitis Gastrointestinal Denies history of Cirrhosis , Colitis, Crohns, Hepatitis A, Hepatitis B, Hepatitis C Endocrine Denies history of Type I Diabetes, Type II Diabetes Genitourinary Denies history of End Stage Renal Disease Immunological Denies history of Lupus Erythematosus, Raynauds, Scleroderma Integumentary (Skin) Denies history of History of Burn Musculoskeletal Denies history of Gout, Rheumatoid Arthritis, Osteoarthritis, Osteomyelitis Neurologic Patient has history of Neuropathy - right foot intermittant Denies history of Dementia, Quadriplegia, Paraplegia, Seizure Disorder Oncologic Denies history of Received Chemotherapy, Received Radiation Psychiatric Denies history of Anorexia/bulimia, Confinement Anxiety Hospitalization/Surgery History - c section x2. - left breast lumpectomy. - iandD right ankle with theraskin. Medical A Surgical History Notes nd Constitutional Symptoms (General Health) H/O miscarriage Cardiovascular bradycardia Gastrointestinal cholilithiasis Objective Constitutional no acute distress. Vitals Time Taken: 9:47 AM, Height: 67 in, Weight: 134 lbs, BMI: 21, Temperature: 98.1 F, Pulse: 96 bpm, Respiratory Rate: 18 breaths/min, Blood Pressure: 135/71 mmHg. Respiratory Normal work of breathing on room air.Marland Kitchen [name], [name] ([id number]) [id number] Page 13 of 16 General Notes: 03/18/2023: Her wounds continue to enlarge and become more painful. The systemic antibiotics do not seem to have made any difference this round. She is having more drainage, as well. Integumentary (Hair,  Skin) Wound #17 status is Open. Original cause of wound was Gradually Appeared. The date acquired was: 10/05/2012. The wound has been in treatment 100 weeks. The wound is located on the Right,Lateral Lower Leg. The wound measures  9.5cm length x 8cm width x 0.2cm depth; 59.69cm^2 area and 11.938cm^3 volume. There is Fat Layer (Subcutaneous Tissue) exposed. There is no tunneling or undermining noted. There is a large amount of serosanguineous drainage noted. The wound margin is distinct with the outline attached to the wound base. There is small (1-33%) pink granulation within the wound bed. There is a large (67- 100%) amount of necrotic tissue within the wound bed including Adherent Slough. The periwound skin appearance had no abnormalities noted for moisture. The periwound skin appearance exhibited: Scarring, Hemosiderin Staining. The periwound skin appearance did not exhibit: Ecchymosis. Periwound temperature was noted as No Abnormality. The periwound has tenderness on palpation. Wound #21 status is Open. Original cause of wound was Gradually Appeared. The date acquired was: 06/26/2021. The wound has been in treatment 90 weeks. The wound is located on the Right,Medial Ankle. The wound measures 12.8cm length x 6cm width x 0.2cm depth; 60.319cm^2 area and 12.064cm^3 volume. There is Fat Layer (Subcutaneous Tissue) exposed. There is no tunneling or undermining noted. There is a large amount of serosanguineous drainage noted. The wound margin is distinct with the outline attached to the wound base. There is small (1-33%) pink granulation within the wound bed. There is a large (67- 100%) amount of necrotic tissue within the wound bed including Adherent Slough. The periwound skin appearance had no abnormalities noted for moisture. The periwound skin appearance had no abnormalities noted for color. The periwound skin appearance exhibited: Scarring. Periwound temperature was noted as No Abnormality. The periwound has tenderness on palpation. 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 - Dr. Lady Gary Rm 2 Bathing/ Shower/ Hygiene: May shower and wash wound with soap and water. Additional Orders / Instructions: Follow Nutritious Diet Juven Shake 1-2 times daily. Lymphedema Treatment Plan - Exercise, Compression and Elevation: Elevate legs 30 - 60 minutes at or above heart level at least 3 - 4 times daily as able/tolerated Avoid standing for long periods and elevate leg(s) parallel to the floor when sitting The following medication(s) was prescribed: gentamicin topical 0.1 % ointment Apply to wound with dressing changes as directed starting 03/18/2023 mupirocin topical 2 % ointment Apply to wound with dressing changes as directed starting 03/18/2023 WOUND #17: - Lower Leg Wound Laterality: Right, Lateral Cleanser: Soap and Water 1 x Per Week/30 Days Discharge Instructions: May shower and wash wound with dial antibacterial soap and water prior to dressing change. Cleanser: Wound Cleanser 1 x Per Week/30 Days Discharge Instructions: Cleanse the wound with wound cleanser prior to applying a clean dressing using gauze sponges, not tissue or cotton balls. Peri-Wound Care: Triamcinolone 15 (g) 1 x Per Week/30 Days Discharge Instructions: Use triamcinolone 15 (g) as directed Peri-Wound Care: Sween Lotion (Moisturizing lotion) 1 x Per Week/30 Days Discharge Instructions: Apply moisturizing lotion as directed Topical: Gentamicin 1 x Per Week/30 Days Discharge Instructions: As directed by physician Topical: Mupirocin Ointment 1 x Per Week/30 Days Discharge Instructions: Apply Mupirocin (Bactroban) as instructed Prim Dressing: Santyl Ointment 1 x Per Week/30 Days ary Discharge Instructions: Apply nickel thick amount to wound bed as instructed Secondary Dressing: ABD Pad, 5x9 1 x Per  Week/30 Days Discharge Instructions: Apply over primary dressing as directed. Secondary Dressing: Woven Gauze Sponge, Non-Sterile 4x4 in 1 x Per Week/30  Days Discharge Instructions: Moisten with normal saline and Apply over primary dressing as directed. Secured With: Elastic Bandage 4 inch (ACE bandage) 1 x Per Week/30 Days Discharge Instructions: Secure with ACE bandage as directed. Secured With: [employer], 4.5x3.1 (in/yd) 1 x Per Week/30 Days Discharge Instructions: Secure with Kerlix as directed. WOUND #21: - Ankle Wound Laterality: Right, Medial Cleanser: Soap and Water 1 x Per Week/30 Days Discharge Instructions: May shower and wash wound with dial antibacterial soap and water prior to dressing change. Cleanser: Wound Cleanser 1 x Per Week/30 Days Discharge Instructions: Cleanse the wound with wound cleanser prior to applying a clean dressing using gauze sponges, not tissue or cotton balls. Peri-Wound Care: Triamcinolone 15 (g) 1 x Per Week/30 Days Discharge Instructions: Use triamcinolone 15 (g) as directed Peri-Wound Care: Sween Lotion (Moisturizing lotion) 1 x Per Week/30 Days Discharge Instructions: Apply moisturizing lotion as directed Topical: Gentamicin 1 x Per Week/30 Days Discharge Instructions: As directed by physician Topical: Mupirocin Ointment 1 x Per Week/30 Days Discharge Instructions: Apply Mupirocin (Bactroban) as instructed Prim Dressing: Santyl Ointment 1 x Per Week/30 Days ary Discharge Instructions: Apply nickel thick amount to wound bed as instructed Secondary Dressing: ABD Pad, 5x9 1 x Per Week/30 Days [name], [name] ([id number]) [id number] Page 14 of 16 Discharge Instructions: Apply over primary dressing as directed. Secondary Dressing: Woven Gauze Sponge, Non-Sterile 4x4 in 1 x Per Week/30 Days Discharge Instructions: Moisten with normal saline and Apply over primary dressing as directed. Secured With: Elastic Bandage 4 inch (ACE bandage) 1 x Per Week/30 Days Discharge Instructions: Secure with ACE bandage as directed. Secured With: [employer], 4.5x3.1 (in/yd)  1 x Per Week/30 Days Discharge Instructions: Secure with Kerlix as directed. 03/18/2023: Her wounds continue to enlarge and become more painful. The systemic antibiotics do not seem to have made any difference this round. She is having more drainage, as well. The wounds were so painful she would not tolerate any efforts at debridement today. I am going to try adding topical gentamicin and mupirocin to the Santyl she is using for dressing changes. She continues on pentoxifylline, although it understands may take a number of weeks to begin showing any effect. I am honestly not sure why she is having expansion of her wounds at this point. She is going to be discussed in wound camp coming up later this week and I am hopeful that some provider will have some insight that might be beneficial to her. Follow-up in 2 weeks. Electronic Signature(s) Signed: 03/18/2023 10:41:44 AM By: Duanne Guess MD FACS Entered By: Duanne Guess on 03/18/2023 10:41:44 -------------------------------------------------------------------------------- HxROS Details Patient Name: Date of Service: [name], [name] 03/18/2023 10:00 A M Medical Record Number: [id number] Patient Account Number: [id number] Date of Birth/Sex: Treating RN: August 11, 1970 (52 y.o. F) Primary Care Provider: Julianne Handler Other Clinician: Referring Provider: Treating Provider/Extender: Koleen Nimrod Weeks in Treatment: 100 Information Obtained From Patient Constitutional Symptoms (General Health) Medical History: Past Medical History Notes: H/O miscarriage Eyes Medical History: Negative for: Cataracts; Glaucoma; Optic Neuritis Ear/Nose/Mouth/Throat Medical History: Negative for: Chronic sinus problems/congestion; Middle ear problems Hematologic/Lymphatic Medical History: Positive for: Anemia; Sickle Cell Disease Negative for: Hemophilia; Human Immunodeficiency Virus; Lymphedema Respiratory Medical  History: Negative for: Aspiration; Asthma; Chronic Obstructive Pulmonary Disease (COPD); Pneumothorax; Sleep Apnea; Tuberculosis Cardiovascular Medical History: Negative for: Angina; Arrhythmia; Congestive Heart  Failure; Coronary Artery Disease; Deep Vein Thrombosis; Hypertension; Hypotension; Myocardial Infarction; Peripheral Arterial Disease; Peripheral Venous Disease; Phlebitis; Vasculitis Past Medical History Notes: bradycardia [name], [name] ([id number]) [id number] Page 15 of 16 Gastrointestinal Medical History: Negative for: Cirrhosis ; Colitis; Crohns; Hepatitis A; Hepatitis B; Hepatitis C Past Medical History Notes: cholilithiasis Endocrine Medical History: Negative for: Type I Diabetes; Type II Diabetes Genitourinary Medical History: Negative for: End Stage Renal Disease Immunological Medical History: Negative for: Lupus Erythematosus; Raynauds; Scleroderma Integumentary (Skin) Medical History: Negative for: History of Burn Musculoskeletal Medical History: Negative for: Gout; Rheumatoid Arthritis; Osteoarthritis; Osteomyelitis Neurologic Medical History: Positive for: Neuropathy - right foot intermittant Negative for: Dementia; Quadriplegia; Paraplegia; Seizure Disorder Oncologic Medical History: Negative for: Received Chemotherapy; Received Radiation Psychiatric Medical History: Negative for: Anorexia/bulimia; Confinement Anxiety Immunizations Pneumococcal Vaccine: Received Pneumococcal Vaccination: No Implantable Devices None Hospitalization / Surgery History Type of Hospitalization/Surgery c section x2 left breast lumpectomy iandD right ankle with theraskin Family and Social History Cancer: No; Diabetes: Yes - Mother; Heart Disease: No; Hereditary Spherocytosis: No; Hypertension: No; Kidney Disease: No; Lung Disease: Yes - Mother; Seizures: No; Stroke: No; Thyroid Problems: No; Tuberculosis: No; Never smoker; Marital Status -  Married; Alcohol Use: Never; Drug Use: No History; Caffeine Use: Daily; Financial Concerns: No; Food, Clothing or Shelter Needs: No; Support System Lacking: No; Transportation Concerns: No Electronic Signature(s) Signed: 03/18/2023 10:45:17 AM By: Duanne Guess MD FACS Entered By: Duanne Guess on 03/18/2023 10:39:29 [name] ([id number]) [id number] Page 16 of 16 -------------------------------------------------------------------------------- SuperBill Details Patient Name: Date of Service: [name] [name] 03/18/2023 Medical Record Number: [id number] Patient Account Number: [id number] Date of Birth/Sex: Treating RN: 10-30-1970 (52 y.o. 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

## 2023-04-01 ENCOUNTER — Ambulatory Visit (INDEPENDENT_AMBULATORY_CARE_PROVIDER_SITE_OTHER): Payer: Medicaid Other | Admitting: Family Medicine

## 2023-04-01 ENCOUNTER — Encounter: Payer: Self-pay | Admitting: Family Medicine

## 2023-04-01 ENCOUNTER — Encounter (HOSPITAL_BASED_OUTPATIENT_CLINIC_OR_DEPARTMENT_OTHER): Payer: Medicaid Other | Attending: General Surgery | Admitting: General Surgery

## 2023-04-01 ENCOUNTER — Other Ambulatory Visit: Payer: Self-pay | Admitting: Family Medicine

## 2023-04-01 VITALS — BP 108/66 | HR 69 | Temp 97.2°F | Resp 12 | Wt 132.0 lb

## 2023-04-01 DIAGNOSIS — L97818 Non-pressure chronic ulcer of other part of right lower leg with other specified severity: Secondary | ICD-10-CM | POA: Diagnosis present

## 2023-04-01 DIAGNOSIS — L97828 Non-pressure chronic ulcer of other part of left lower leg with other specified severity: Secondary | ICD-10-CM | POA: Diagnosis not present

## 2023-04-01 DIAGNOSIS — D571 Sickle-cell disease without crisis: Secondary | ICD-10-CM | POA: Insufficient documentation

## 2023-04-01 DIAGNOSIS — D5700 Hb-SS disease with crisis, unspecified: Secondary | ICD-10-CM | POA: Diagnosis not present

## 2023-04-01 DIAGNOSIS — E559 Vitamin D deficiency, unspecified: Secondary | ICD-10-CM

## 2023-04-01 DIAGNOSIS — G894 Chronic pain syndrome: Secondary | ICD-10-CM | POA: Diagnosis not present

## 2023-04-01 LAB — POCT URINALYSIS DIPSTICK
Bilirubin, UA: NEGATIVE
Glucose, UA: NEGATIVE
Ketones, UA: NEGATIVE
Leukocytes, UA: NEGATIVE
Nitrite, UA: NEGATIVE
Protein, UA: POSITIVE — AB
Spec Grav, UA: 1.015 (ref 1.010–1.025)
Urobilinogen, UA: 0.2 U/dL
pH, UA: 5.5 (ref 5.0–8.0)

## 2023-04-01 MED ORDER — OXYCODONE HCL 10 MG PO TABS
10.0000 mg | ORAL_TABLET | ORAL | 0 refills | Status: DC
Start: 2023-04-01 — End: 2023-04-16

## 2023-04-01 NOTE — Patient Instructions (Signed)
Living With Sickle Cell Disease Living with a long-term condition, such as sickle cell disease, can be a challenge. It can affect both your physical and mental health. You may not have total control over your condition. But proper care and treatment can help manage the effects of the disease so you can feel good and lead an active life. You can take steps to manage your condition and stay as healthy as possible. How does sickle cell disease affect me? Sickle cell disease can cause challenges that affect your quality of life. You may get sick more often as a result of organ damage and infections. Sometimes you may need to stay in the hospital. Learn how to recognize that you are not feeling well and that you may be getting sick. What actions can I take to manage my condition?  The goals of treatment are to control your symptoms and prevent and treat problems. Work with your health care provider to create a treatment plan that works for you. Taking an active role in managing your condition can help you feel more in control of your situation. Ask about possible side effects of medicines that your health care provider recommends. Discuss how you feel about having those side effects. Keeping a healthy lifestyle can help you manage your condition. This includes eating a healthy diet, getting enough sleep, and getting regular exercise. Sickle cell disease may affect your ability to take care of your basic needs. Tell your health care provider if you have concerns about any of these needs: Access to food. Housing. Safe drinking water and other utilities. Safety in your home and community. Work or school. Transportation. Paying for health care. Your health care provider may be able to connect you with community resources that can help you. How to manage stress  Living with sickle cell disease can be stressful. This disease can have a big impact on your mental health. Talk with your health care provider  about ways to reduce your stress or if you have concerns about your mental health.  To cope with stress, try: Keeping a stress diary. This can help you learn what causes your stress to start (figure out your triggers) and how to control your response to those triggers. Spending time doing things that you enjoy, such as: Hobbies. Being outdoors. Spending time with friends and people who make you laugh. Doing yoga, muscle relaxation, deep breathing, or mindfulness practices. Expressing yourself through journal writing, art, crafting, poetry, or playing music. Staying positive about your health. Try to accept that you cannot control your condition perfectly. Follow these instructions at home: Medicines Take over-the-counter and prescription medicines only as told by your health care provider. If you were prescribed antibiotics, take them as told by your health care provider. Do not stop taking them even if you start to feel better. If you develop a fever, do not take medicines to reduce the fever right away. This could cover up another problem. Contact your health care provider. Eating and drinking Drink enough fluid to keep your urine pale yellow. Drink more in hot weather and during exercise. Limit or avoid drinking alcohol. Eat a balanced and nutritious diet. Eat plenty of fruits, vegetables, whole grains, and lean protein. Take vitamins and supplements as told by your health care provider. Traveling When traveling, keep these with you: Your medical information. The names of your health care providers. Your medicines. If you have to travel by air, ask about precautions you should take. Managing pain Work with  your health care provider to create a pain management plan that works for you. The plan may include: Ways to reduce or manage your pain at home, such as: Using a heating pad. Taking a warm bath. Using healthy ways to distract you from the pain, such as hobbies or  reading. Practicing ways to relax, such as doing yoga or listening to music. Getting massages. Doing exercises or stretches as told by a physical therapist. Tracking how pain affects your daily life functions. When to seek help. Who to contact and what to do in case of a pain emergency. General instructions Do not use any products that contain nicotine or tobacco. These products include cigarettes, chewing tobacco, and vaping devices, such as e-cigarettes. These lower blood oxygen levels. If you need help quitting, ask your health care provider. Consider wearing a medical alert bracelet. Use an app or journal to track your symptoms, assess your level of pain and fatigue, and keep track of your medicines. Avoid the following: High altitudes. Very high or low temperatures and big changes in temperature. Activities that will lower your oxygen levels, such as mountain climbing or doing exercise that takes a lot of effort. Stay up to date on: Your treatment plan. Learn as much as you can about your condition. Health screenings. This will help prevent problems or catch them early on. Vaccines. This will help prevent infection. Wash your hands often with soap and water to help prevent infections. Wash them for at least 20 seconds each time. Keep all follow-up visits. Regular follow-up with your health care provider can help you better manage your condition. Where to find support You can find help and support through: Talking with a therapist or taking part in support groups. Sickle Cell Disease Foundation of [place]: www.sicklecelldisease.org Where to find more information Centers for Disease Control and Prevention: FootballExhibition.com.br American Society of Hematology: www.hematology.org Contact a health care provider if: Your symptoms get worse. You have new symptoms. You have a fever. Get help right away if: You have a painful erection of the penis that lasts a long time (priapism). You become  short of breath or are having trouble breathing. You have pain that cannot be controlled with medicine. You have any signs of a stroke. "BE FAST" is an easy way to remember the main warning signs: B - Balance. Dizziness, sudden trouble walking, or loss of balance. E - Eyes. Trouble seeing or a change in how you see. F - Face. Sudden weakness or loss of feeling of the face. The face or eyelid may droop on one side. A - Arms. Weakness or loss of feeling in an arm. This happens all of a sudden and most often on one side of the body. S - Speech. Sudden trouble speaking, slurred speech, or trouble understanding what people say. T - Time. Time to call emergency services. Write down what time symptoms started. You have other signs of a stroke, such as: A sudden, very bad headache with no known cause. Feeling like you may vomit (nausea). Vomiting. Seizure. These symptoms may be an emergency. Get help right away. Call 911. Do not wait to see if the symptoms will go away. Do not drive yourself to the hospital. Also, get help right away if: You have strong feelings of sadness or loss of hope, or you have thoughts about hurting yourself or others. Take one of these steps if you feel like you may hurt yourself or others, or have thoughts about taking your own life:  Go to your nearest emergency room. Call 911. Call the National Suicide Prevention Lifeline at (380) 276-8495 or 988. This is open 24 hours a day. Text the Crisis Text Line at 425-206-2876. Summary Proper care and treatment can help manage the effects of sickle cell disease so you can feel good and lead an active life. The goals of treatment are to control your symptoms and prevent and treat problems. Taking an active role in managing your condition can help you feel more in control of your situation. Work with your health care provider to create a pain management plan that works for you. Get medical help right away as told by your health care  provider. This information is not intended to replace advice given to you by your health care provider. Make sure you discuss any questions you have with your health care provider. Document Revised: 08/13/2021 Document Reviewed: 08/13/2021 Elsevier Patient Education  2024 [employer].

## 2023-04-01 NOTE — Progress Notes (Signed)
KIMELA, ADCOX ([id number]) [id number] Page 1 of 21 Visit Report for 04/01/2023 Chief Complaint Document Details Patient Name: Date of Service: [name] [name] 04/01/2023 8:30 A M Medical Record Number: [id number] Patient Account Number: [id number] Date of Birth/Sex: Treating RN: [birth year] (52 y.o. F) Primary Care Provider: Julianne Handler Other Clinician: Referring Provider: Treating Provider/Extender: Koleen Nimrod Weeks in Treatment: [phone] Information Obtained from: Patient Chief Complaint the patient is here for evaluation of her bilateral lower extremity sickle cell ulcers 04/17/2021; patient comes in for substantial wounds on the right and left lower leg Electronic Signature(s) Signed: 04/01/2023 10:13:33 AM By: Duanne Guess MD FACS Entered By: Duanne Guess on 04/01/2023 07:13:33 -------------------------------------------------------------------------------- Debridement Details Patient Name: Date of Service: [name], [name] 04/01/2023 8:30 A M Medical Record Number: [id number] Patient Account Number: [id number] Date of Birth/Sex: Treating RN: Sep 20, 1970 (52 y.o. [name] Primary Care Provider: Julianne Handler Other Clinician: Referring Provider: Treating Provider/Extender: Koleen Nimrod Weeks in Treatment: 102 Debridement Performed for Assessment: Wound #17 Right,Lateral Lower Leg Performed By: Physician Duanne Guess, MD The following information was scribed by: Samuella Bruin The information was scribed for: Duanne Guess Debridement Type: Debridement Level of Consciousness (Pre-procedure): Awake and Alert Pre-procedure Verification/Time Out Yes - 09:10 Taken: Start Time: 09:10 Pain Control: Lidocaine 4% T opical Solution Percent of Wound Bed Debrided: 100% T Area Debrided (cm): otal 60.44 Tissue and other material debrided: Non-Viable, Slough, Subcutaneous,  Slough Level: Skin/Subcutaneous Tissue Debridement Description: Excisional Instrument: Curette Bleeding: Minimum Hemostasis Achieved: Pressure Response to Treatment: Procedure was tolerated well Level of Consciousness (Post- Awake and Alert procedure): Post Debridement Measurements of Total Wound Length: (cm) 10 Width: (cm) 7.7 Depth: (cm) 0.2 Volume: (cm) 12.095 Character of Wound/Ulcer Post Debridement: Requires Further Debridement [name] ([id number]) [id number] Page 2 of 21 Post Procedure Diagnosis Same as Pre-procedure Electronic Signature(s) Signed: 04/01/2023 10:34:59 AM By: Duanne Guess MD FACS Signed: 04/01/2023 3:33:50 PM By: Samuella Bruin Entered By: Samuella Bruin on 04/01/2023 06:12:41 -------------------------------------------------------------------------------- Debridement Details Patient Name: Date of Service: [name], [name] 04/01/2023 8:30 A M Medical Record Number: [id number] Patient Account Number: [id number] Date of Birth/Sex: Treating RN: 08/27/1970 (52 y.o. [name] Primary Care Provider: Julianne Handler Other Clinician: Referring Provider: Treating Provider/Extender: Koleen Nimrod Weeks in Treatment: 102 Debridement Performed for Assessment: Wound #21 Right,Medial Ankle Performed By: Physician Duanne Guess, MD The following information was scribed by: Samuella Bruin The information was scribed for: Duanne Guess Debridement Type: Debridement Severity of Tissue Pre Debridement: Fat layer exposed Level of Consciousness (Pre-procedure): Awake and Alert Pre-procedure Verification/Time Out Yes - 09:10 Taken: Start Time: 09:10 Pain Control: Lidocaine 4% T opical Solution Percent of Wound Bed Debrided: 100% T Area Debrided (cm): otal 54.95 Tissue and other material debrided: Non-Viable, Slough, Subcutaneous, Slough Level: Skin/Subcutaneous  Tissue Debridement Description: Excisional Instrument: Curette Bleeding: Minimum Hemostasis Achieved: Pressure Response to Treatment: Procedure was tolerated well Level of Consciousness (Post- Awake and Alert procedure): Post Debridement Measurements of Total Wound Length: (cm) 12.5 Width: (cm) 5.6 Depth: (cm) 0.2 Volume: (cm) 10.996 Character of Wound/Ulcer Post Debridement: Requires Further Debridement Severity of Tissue Post Debridement: Fat layer exposed Post Procedure Diagnosis Same as Pre-procedure Electronic Signature(s) Signed: 04/01/2023 10:34:59 AM By: Duanne Guess MD FACS Signed: 04/01/2023 3:33:50 PM By: Samuella Bruin Entered By: Samuella Bruin on 04/01/2023 06:14:04 HPI Details -------------------------------------------------------------------------------- [name] ([id number]) [id number] Page 3 of 21 Patient Name: Date of Service: [name], [name]  04/01/2023 8:30 A M Medical Record Number: [id number] Patient Account Number: [id number] Date of Birth/Sex: Treating RN: 11-05-1970 (52 y.o. F) Primary Care Provider: Julianne Handler Other Clinician: Referring Provider: Treating Provider/Extender: Koleen Nimrod Weeks in Treatment: 102 History of Present Illness Location: medial and lateral ankle region on the right and left medial malleolus Quality: Patient reports experiencing a shooting pain to affected area(s). Severity: Patient states wound(s) are getting worse. Duration: right lower extremity bimalleolar ulcers have been present for approximately 2 years; the right medial malleolus ulcer has been there proximally 6 months Timing: Pain in wound is constant (hurts all the time) Context: The wound would happen gradually ssociated Signs and Symptoms: Patient reports having increase discharge. A HPI Description: 52 year old patient with a history of sickle cell anemia who was last seen by me with  ulceration of the right lower extremity above the ankle and was referred to Dr. Marzetta Board for a surgical debridement as I was unable to do anything in the office due to excruciating pain. At that stage she was referred from the plastic surgery service to dermatology who treated her for a skin infection with doxycycline and then Levaquin and a local antibiotic ointment. I understand the patient has since developed ulceration on the left ankle both medial and lateral and was now referred back to the wound center as dermatology has finished the management. I do not have any notes from the dermatology department Old notes: 52 year old patient with a history of sickle cell anemia, pain bilateral lower extremities, right lower extremity ulcer and has a history of receiving a skin graft( Theraskin) several months ago. She has been visiting the wound center [facility] and was seen by Dr. Leanord Hawking and Dr. Marzetta Board. after prolonged conservator therapy between July 2016 and January 2017. She had been seen by the plastic surgeon and taken to the OR for debridement and application of Theraskin. She had 3 applications of Theraskin and was then treated with collagen. Prior to that she had a history of similar problems in 2014 and was treated conservatively. Had a reflux study done for the right lower extremity in August 2016 without reflux or DVT . Past medical history significant for sickle cell disease, anemia, leg ulcers, cholelithiasis,and has never been a smoker. Once the patient was discharged on the wound center she says within 2 or 3 weeks the problems recurred and she has been treating it conservatively. since I saw her 3 weeks ago at [facility] she has been unable to get her dressing material but has completed a course of doxycycline. 6/7/ 2017 -- lower extremity venous duplex reflux evaluation was done No evidence of SVT or DVT in the RLL. No venous incompetence in the RLL. No further vascular workup is  indicated at this time. She was seen by Dr. Mina Marble, on 10/04/2015. She agreed with the plan of taking her to the OR for debridement and application of theraskin and would also take biopsies to rule out pyoderma gangrenosum. Follow-up note dated May 31 received and she was status post application of Theraskin to multiple ulcers around the right ankle. Pathology did not show evidence of malignancy or pyoderma gangrenosum. She would continue to see as in the wound clinic for further care and see Dr. Marzetta Board as needed. The patient brought the biopsy report and it was consistent with stasis ulcer no evidence of malignancy and the comment was that there was some adjacent neovascularization, fibrosis and patchy perivascular chronic inflammation. 11/15/2015 -- today we applied  her first application of Theraskin 11/30/15; TheraSkin #2 12/13/2015 -- she is having a lot of pain locally and is here for possible application of a theraskin today. 01/16/2016 -- the patient has significant pain and has noticed despite in spite of all local care and oral pain medication. It is impossible to debride her in the office. 02/06/2016 -- I do not see any notes from Dr. Leta Baptist( the patient has not made a call to the office know as she heard from them) and the only visit to recently was with her PCP Dr. Gypsy Decant -- I saw her on 01/16/2016 and prescribed 90 tablets of oxycodone 10 mg and did lab work and screening for HIV. the HIV was negative and hemoglobin was 6.3 with a WBC count of 14.9 and hematocrit of 17.8 with platelets of 561. 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 The bimalleolar ulcers to the right lower extremity have been present for approximately 2 years, the left medial malleolus ulcer has been present approximately 6 months. She has followed with Dr.Thimmappa in the past and  has had a total of 3 applications of Theraskin (01/2015, 09/2015, 06/17/16). She has also followed with Dr. Meyer Russel here in the clinic and has received 2 applications of TheraSkin (11/10/15, 11/30/15). The patient does experience chronic, and is not amenable to debridement. 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 She is not currently on any antibiotic therapy and has not been treated with any recently. 07/17/2016 -- was seen by Dr. Leta Baptist of plastic surgery who saw her 2 weeks postop application of Theraskin #3. She had removed her dressing and asked her to apply silver alginate on alternate days and follow-up back with the wound center. Future debridements and application of skin substitute would have to be done in the hospital due to her high risk for anesthesia. READMISSION 04/17/2021 Patient is now a 52 year old woman that we have had in this clinic for a prolonged period of time and 2016-2017 and then again for 2 visits in February 2018. At that point she had wounds on the right lower leg predominantly medial. She had also been seen by plastic surgery Dr. Marzetta Board who I believe took her to the OR for operative debridement and application of TheraSkin in 2017. After she left our clinic she was followed for a very prolonged period of time in the wound care center in [facility] who then referred her ultimately to [facility] where she was seen by Dr. Mardene Speak. Again taken her to the OR for skin grafting which apparently did not take. She had multiple other attempts at dressings although I have not really looked over all of these notes in great detail. She has not been seen in a wound care center in about a year. She states over the last year in addition to her right lower leg she has developed wounds on the left lower leg quite extensive. She is using Xeroform to all of these wounds without really any improvement. She also has Medicaid which does not cover wound  products. The patient has had vascular work-ups in the past including most recently on 03/28/2021 showing biphasic waveforms on the right triphasic at the PTA and biphasic at the dorsalis pedis on the left. She was unable to tolerate any degree of compression to do ABIs. Unfortunately TBI's were also not done. She had venous  reflux studies done in 2017. This did not show any evidence of a DVT or SVT and no venous incompetence was noted in the right leg at the time this was the only side with the wound As noted I did not look all over her old records. She apparently had a course of HBO and Baptist although I am not sure what the indication would have been. In any case she developed seizures and terminated treatment earlier. She is generally much more disabled than when we last saw her in clinic. She can no longer walk pretty much wheelchair-bound because predominantly of pain in the left hip. 04/24/2021; the patient tolerated the wraps we put on. We used Santyl and Hydrofera Blue under compression. I brought her back for a nurse visit for a change in dressing. With Medicaid we will have a hard time getting anything paid for and hence the need for compression. She arrives in clinic with all the wounds looking somewhat better in terms of surface 12/20; circumferential wound on the right from the lateral to the medial. She has open areas on the left medial and left lateral x2 on all of this with the same surface. This does not look completely healthy although she does have some epithelialization. She is not complaining of a lot of pain which is unusual for her sickle ulcers. [name], [name] ([id number]) [id number] Page 4 of 21 I have not looked over her extensive records from [facility]. She had recent arterial studies and has a history of venous reflux studies I will need to look these over although I do not believe she has significant arterial disease 2023 05/22/2021; patient's  wound areas measure slightly smaller. Still a lot of drainage coming from the right we have been using Hydrofera Blue and Santyl with some improvement in the wound surfaces. She tells me she will be getting transfused later in the week for her underlying sickle cell anemia I have looked over her recent arterial studies which were done in the fall. This was in November and showed biphasic and triphasic waveforms but she could not tolerate ABIs because of pressure and unfortunately TBI's were not done. She has not had recent venous reflux studies that I can see 1/10; not much change about the same surface area. This has a yellowish surface to it very gritty. We have been using Santyl and Hydrofera Blue for a prolonged period. Culture I did last week showed methicillin sensitive staph aureus "rare". Our intake nurse reports greenish drainage which may be the Hydrofera Blue itself 1/17; wounds are continue to measure smaller although I am not sure about the accuracy here. Especially the areas on the right are covered in what looks to be a nonviable surface although she does have some epithelialization. Similarly she has areas on the left medial and left lateral ankle area which appear to have a better surface and perhaps are slightly smaller. We have been using Santyl and Hydrofera Blue. She cannot tolerate mechanical debridements She went for her reflux studies which showed significant reflux at the greater saphenous vein at the saphenofemoral junction as well as the greater saphenous vein in the proximal calf on the left she had reflux in the thigh and the common femoral vein and supra vein Fishel vein reflux in the greater saphenous vein. I will have vein and vascular look at this. My thoughts have been that these are likely sickle wounds. I looked through her old records from [facility] wound care center and then when  she graduated to [facility] wound care center where she saw Dr. Ronda Fairly and Dr.  Mardene Speak. Although I can see she had reflux studies done I do not see that she actually saw a vein and vascular. I went over the fact that she had operative debridements and actual skin grafting that did not take. I do not think these wounds have ever really progressed towards healing 1/31;Substantial wounds on the right ankle area. Hyper granulated very gritty adherent debris on the surface. 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 On the left she has no DVT or SVT deep vein reflux in the common femoral vein. Superficial vein reflux in the greater saphenous vein on although the vein diameter is not really all that large. I do not think there is anything that can be done with these although I am going to send her for consultation to vein and vascular. 2/7; Wound exam; substantial wound area on the right posterior ankle area and areas on the left medial ankle and left lateral ankle. I was able to debride the left medial ankle last week fairly aggressively and it is back this week to a completely nonviable surface She will see vascular surgery this Friday and I would like them to review the venous studies and also any comments on her arterial status. If they do not see an issue here I am going to refer her to plastic surgery for an operative debridement perhaps intraoperative ACell or Integra. Eventually she will require a deep tissue culture again 2/14; substantial wound area on the right posterior ankle, medial ankle. We have been using silver alginate The patient was seen by vein and vascular she had both venous reflux studies and arterial  studies. 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 She had no evidence of superficial venous thrombosis. She did have superficial vein reflux at the saphenofemoral junction and the greater saphenous vein. On the left no evidence of a DVT no evidence of superficial venous throat thrombosis she did have deep vein reflux in the common femoral vein and superficial vein reflux in the greater saphenous vein but these were not felt to be amenable to ablation. In terms of arterial studies she had triphasic and wife biphasic wave waveforms bilaterally not felt to have a significant arterial issue. I do not get the feeling that they felt that any part of her nonhealing wounds were related to either arterial or venous issues. They did note that she had venous reflux at the right at the [facility] and [facility]. And also on the left there were reflux in the deep system at the common femoral vein and greater saphenous vein in the proximal thigh. Nothing amenable to ablation. 2/20; she is making some decent progress on the right where there is nice skin between the 2 open areas on the right ankle. The surfaces here do not look viable yet there is some surrounding epithelialization. She still has a small area on the left medial ankle area. Hyper-granulated [name] 2/28 patient has an appointment  with plastic surgery on 3/8. We will see her back on 3/9. She may have to call us to get the area redressed. We've been using Santyl under silver alginate. We made a nice improvement on the left medial ankle. The larger wounds on the right also looks somewhat better in terms of epithelialization although I think they could benefit from an aggressive debridement if plastic surgery would be willing to do that. Perhaps placement of Integra or a cell 07/26/2021: She saw Dr. Arita Miss yesterday. He raised the question as to whether or not this might be pyoderma  and wanted to wait until that question was answered by dermatology before proceeding with any sort of operative debridement. We have continue to use Santyl under silver alginate with Kerlix and Coban wraps. Overall, her wounds appear to be continuing to contract and epithelialize, with some granulation tissue present. There continues to be some slough on all wound surfaces. 08/09/2021: She has not been able to get an appointment with dermatology because apparently the offices in [facility] and [place] do not accept Medicaid. She is looking into whether or not she can be seen at the main [facility] dermatology clinic. This is necessary because plastic surgery is concerned that her wounds might represent pyoderma and they did not want to do any procedure until that was clarified. We have been using Santyl under silver alginate with Kerlix and Coban wraps. Today, there was a greater amount of drainage on her dressings with a slight green discoloration and significant odor. Despite this, her wounds continue to contract and epithelialize. There is pale granulation tissue present and actually, on the left medial ankle, the granulation tissue is a bit hypertrophic. 08/16/2021: Last week, I took a culture and this grew back rare methicillin-resistant Staph aureus and rare corynebacterium. The MRSA was sensitive to gentamicin which we began applying topically on an empiric basis. This week, her wounds are a bit smaller and the drainage and odor are less. Her primary care provider is working on assisting the patient with a dermatology evaluation. She has been in silver alginate over the gentamicin that was started last week along with Kerlix and Coban wraps. 08/23/2021: Because she has Medicaid, we have been unable to get her into see any dermatologist in the [place] to rule out pyoderma gangrenosum, which was a requirement from plastic surgery prior to any sort of debridement and grafting. Despite this,  however, all of her wounds continue to get smaller. The wound on her left medial ankle is nearly closed. There is no odor from the wounds, although she still accumulates a modest amount of drainage on her dressings. 08/30/2021: The lateral right ankle wound and the medial left ankle wound are a bit smaller today. The medial right ankle wound is about the same size. They are less tender. We have still been unable to get her into dermatology. 09/06/2021: All of the wounds are about the same size today. She continues to endorse minimal pain. I communicated with Dr. Arita Miss in plastic surgery regarding our issues getting a dermatology appointment; he was out of town but indicated that he would look into perhaps performing the biopsy in his office and will have his office contact her. 09/14/2021: The patient has an appointment in dermatology, but it is not until October. Her wounds are roughly the same; she continues to have very thick purulent-looking drainage on her dressings. [name], [name] ([id number]) [id number] Page 5 of 21 09/20/2021: The left medial wound is nearly closed and  just has a bit of accumulated eschar on the surface. The right medial and lateral ankle wounds are perhaps a little bit smaller. They continue to have a very pale surface with accumulation of thin slough. 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 I did not think Jodie Echevaria was indicated based on this. She is getting topical mupirocin with Prisma silver collagen. 10/04/2021: The patient was not seen in clinic last week due to childcare coverage issues. In the interim, the left medial leg wound has closed. The right sided leg wounds are smaller. There is more granulation tissue coming through, particularly on the lateral wound. The surface remains somewhat gritty. We have been applying topical mupirocin and Prisma silver collagen. 10/11/2021: The left medial leg wound remains closed.  She does complain of some anesthetic sensation to the area. Both of the right-sided leg wounds are smaller but still have accumulated slough. 10/18/2021: Both right-sided leg wounds are minimally smaller this week. She still continues to accumulate slough and has thick drainage on her dressings. 10/23/2021: Both wounds continue to contract. There is still slough buildup. She has been approved for a keratin-based skin substitute trial product but it will not be available until next week. 10/30/2021: The wounds are about the same to perhaps slightly smaller. There is still continued slough buildup. Unfortunately, the rep for the keratin based product did not show up today and did not answer his phone when called. 11/08/2021: The wounds are little bit smaller today. She continues to have thick drainage but the surfaces are relatively clean with just a little bit of slough accumulation. She reported to me today that she is unable to completely flex her left ankle and on examination it seems this is potentially related to scar tissue from her wounds. We do have the ProgenaMatrix trial product available for her today. 11/15/2021: Both wounds are smaller today. There is some slough accumulation on the surfaces, but the medial wound, in particular looks like it is filling in and is less deep. She did hear from physical therapy and she is going to start working with them on July 11. She is here for her second application of the trial skin substitute, ProgenaMatrix. 11/22/2021: Both wounds continue to contract, the medial more dramatically than the lateral. Both wounds have a layer of slough on the surface, but underneath this, the gritty fibrous tissue has a little bit more of a pink cast to it rather than being as pale as it has been. 11/29/2021: The wounds are roughly the same size this week, perhaps a millimeter or 2 smaller. The medial wound has filled in and is nearly flush with the surrounding skin surface. She  continues to have a lot of slough accumulation on both surfaces. 12/06/2021: No significant change to her wounds, but she has a new opening on her dorsal foot, just distal to the right lateral ankle wound. The area on her left medial ankle that reopened looks a little bit larger today. She has quite a bit of pain associated with the new wound. 12/12/2021: Her wounds look about the same but the new opening on her right lateral dorsal foot is a little bit bigger. She continues to have a fair amount of pain with this wound. 12/26/2021: The left medial ankle wound is tiny and superficial. She has 2 areas of crusting on her left lateral ankle, however, that appear to be threatening to open again. Her right medial ankle wound is a little bit smaller  today but still continues to accumulate thick rubbery slough. The new dorsal foot wound is exquisitely painful but there is no odor or purulent drainage. No erythema or induration. The right lateral ankle wound looks about the same today, again with thick rubbery slough. 01/03/2022: The left medial ankle wound has closed again. Both right ankle wounds appear to be about the same size with thick rubbery slough. The dorsal foot wound on the right continues to be quite painful and she stated that she did not want any debridement of that site today. 01/10/2022: No real change to any of her wounds. She continues to accumulate thick slough. The dorsal foot wound has merged with the lateral malleolar wound. She is experiencing significant pain in the dorsal foot portion of the ulcer. 01/16/2022: Absolutely no change or progress in her wounds. 01/24/2022: Her wounds are unchanged. She continues to build up slough and the wounds on her dorsal right foot are still exquisitely tender. 01/31/2022: The wounds actually measure a little bit narrower today. They still have thick slough on the surface but the underlying tissue seems a little less fibrotic. We changed to Iodosorb last  week. 02/07/2022: Wounds continue to slowly epithelialize around the parameters. She has less pain today. She still accumulates a fairly substantial layer of slough. 02/15/2022: No change to the wounds overall. The measured a little bit larger today per the intake nurse. She continues to have substantial slough accumulation and her pain is a little bit worse. 02/21/2022. No change at all to her wounds. I do not see that plastic surgery has received her referral yet. 02/28/2022: The wounds remain unchanged. They are dry and fibrotic with accumulation of slough and eschar. She did receive an appointment to see plastic surgery on October 23, but the office called her back and indicated they needed to reschedule and that the next available appointment was not until December. The patient became angry and decided she did not want to see this plastics group. 10/19; the patient sees Dr. Maylene Roes again next week. She is using Medihoney as the primary dressing changing this herself. 03/21/2022: She saw Dr. Ferd Hibbs at the wound care center at [facility]. Dr. Ferd Hibbs was also unable to debride her, secondary to pain and is planning to take her to the operating room for operative debridement and potential skin substitute placement. Her wounds are unchanged with thick yellow slough and a fibrotic base. She says they are too painful to be debrided. In addition, yesterday her hemoglobin was 4 and she received 2 units packed red blood cell transfusion. 04/04/2022: Her operation at [facility] is scheduled for December 15. She continues to use Medihoney on her wounds. They are a little bit less painful today and she is willing to entertain the possibility of debridement. The wounds measured a little bit larger in all dimensions today but the layer of slough is not as thick as usual. 04/18/2022: Her wounds actually measured a little bit smaller today and the extension onto the dorsal foot from the  lateral wound has healed. They are less tender and there is significantly less slough present as compared to prior visits. 05/09/2022: She had to reschedule her surgery due to lack of childcare. It is now planned for January 5. The wounds are basically unchanged, but she had a lot more drainage, which was thick and somewhat purulent in appearance. No significant odor. 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 They are quite painful today and she does  not want to have debridement performed. 05/30/2022: The patient underwent surgical debridement and placement of TheraSkin to her wounds on January 5. She has been doing well and does not endorse any pain. The TheraSkin is intact and looks beautiful. It is sutured in place. There is no exudate or significant drainage. 06/04/2022: Her TheraSkin remains intact and I am starting to see granulation tissue emerging underneath the surface. No concern for infection. HOPE, ROSENDO ([id number]) [id number] Page 6 of 21 06/12/2022: The TheraSkin is intact and there is more granulation tissue emerging. It is starting to look a little bit dry, however. No malodor or purulent drainage. 06/19/2022: The TheraSkin continues to look good. The edges are a bit dry, but the central portion of each of her wounds is showing a nice pink color. 06/27/2022: The TheraSkin on the lateral wound remains almost completely intact. There is nice pink tissue peaking through the mesh of the TheraSkin. On the medial leg, it is starting to breakdown a little bit, but most of the TheraSkin remains intact here, as well. Both wounds measured smaller today. 07/03/2022: The wounds are fairly dry around the perimeter today. Some of the suture is hanging loose. 07/12/2022: Both wounds are measuring smaller today. The medial ulcer is getting a bit too dry. The lateral ulcer continues to have nice buds of granulation tissue emerging. 07/26/2022: The moisture balance  on both wounds is much better today. For the first time since I began seeing her, I see actual beefy red granulation tissue on the lateral ankle wound. There are more buds of deep pink granulation tissue emerging on the medial ankle. There is some dry eschar around the edges of the medial wound and a tiny amount of slough overlying the good granulation tissue on the lateral wound. 08/09/2022: Unfortunately, she has a new wound on her left lateral lower leg, just above the ankle. It is extremely painful for her. It penetrates to the fat layer and has the same woody, fibrotic character as her previous wounds. The other wounds are both a little bit smaller with slough and some eschar around the periphery. 08/14/2022: Her right lateral ankle wound is a little bit smaller. Unfortunately, she has had an increase in her drainage and it has a purulent nature to it, similar to what was present prior to her surgical debridement. She also reports odor coming from her wounds this week. The left lateral leg wound remains extremely tender. 08/21/2022: The left lateral ankle wound is larger and more painful today. Both right ankle wounds have a little bit more vital appearance, but they have accumulated more slough. She reports less odor this week. She is currently taking Augmentin. 08/28/2022: The left lateral ankle wound continues to enlarge and remains quite painful. Both of the right ankle wounds actually look better this week. There is improved tissue quality with less slough accumulation. 09/27/2022: The left lateral ankle wound actually looks nearly healed. She says that she has been applying some silver alginate that she had leftover. She was unable to afford the Santyl that was prescribed, but found a tube that she had on hand at home and has been using this on her right ankle wounds. These wounds look about the same and have accumulated the usual layer of slough. Unfortunately, a portion of the dorsal foot wound  has reopened and is exquisitely painful today. 10/09/2022: The left lateral ankle wound is healed. The dorsal foot wound appears to have expanded and remains exquisitely painful. The medial and lateral  right ankle wounds look about the same size, but they are quite a bit cleaner. 10/23/2022: The left lateral ankle remains closed. The dorsal foot wound has expanded to the point that it now converges with the right lateral leg wound. She has accumulated fairly thick slough on all of the open surfaces. 11/06/2022: No significant change to any of her wounds, although the dorsal foot aspect of the right lateral wound is a little bit smaller. She has been approved for the [employer] and [employer] for [employer] and will be able to get a full year's supply. 11/20/2022: The dorsal foot aspect of the right lateral wound has almost completely closed. I am seeing nice pink tissue starting to emerge from the fibrotic surface of both wounds. The medial leg wound appears to be a little bit smaller today. She has been using Santyl. 12/04/2022: The dorsal foot aspect of the right lateral wound has closed. There is more pink granulation tissue emerging on her wounds. There are buds of epithelium beginning to fill in from the edges of her wound, particularly at the proximal aspect of the medial wound and the distal aspect of the lateral wound. Significantly less slough formation than in the past. 12/18/2022: The wounds measured about the same size today, but there is better granulation tissue filling in and the overall surface consistency is improving. She does have slough accumulation on the surfaces, but no malodor or purulent drainage. 01/01/2023: No significant change to her wounds. The surface seems a little bit more fibrotic today. She is complaining of more pain. 01/15/2023: Her wounds are stable today. She has been taking Augmentin based on the culture that I took at her previous visit. She says that she has  felt physically better since being on the antibiotic and has had less drainage from her wounds. 01/29/2023: The wounds are unchanged and remained fibrotic and painful. She does report having less pain while on antibiotics and less drainage, as well. 02/07/2023: Her wounds have gotten larger and more painful. She completed her course of antibiotics yesterday. She still has a fair amount of drainage coming from her wounds. 02/24/2023: Her wounds continue to enlarge. They are more painful. 03/18/2023: Her wounds continue to enlarge and become more painful. The systemic antibiotics do not seem to have made any difference this round. She is having more drainage, as well. 04/01/2023: Her wounds are about the same size today. They have a thick layer of slough on both the medial and lateral aspect. Underneath, however, the tissue is not quite as pale as it usually appears. She does have 3 new wounds on her left lower leg, 1 lateral and 2 medial. They are covered with slough and eschar and are exquisitely painful. She has been applying silver alginate to these at home. Her case was discussed in the [facility] conference recently. Several suggestions were made including Lidoderm patches, vascular referral for an angiogram, and potentially further surgery. They also suggested getting a TCOM done with the Lidoderm patch in situ. They agreed with the decision to put her on pentoxifylline. Electronic Signature(s) Signed: 04/01/2023 10:19:44 AM By: Duanne Guess MD FACS Entered By: Duanne Guess on 04/01/2023 07:19:44 Physical Exam Details -------------------------------------------------------------------------------- [name] ([id number]) [id number] Page 7 of 21 Patient Name: Date of Service: [name], [place] [name] 04/01/2023 8:30 A M Medical Record Number: [id number] Patient Account Number: [id number] Date of Birth/Sex: Treating RN: May 06, 1971 (52 y.o. F) Primary  Care Provider: Julianne Handler Other Clinician: Referring Provider: Treating  Provider/Extender: Koleen Nimrod Weeks in Treatment: 102 Constitutional . Slightly tachycardic. . . no acute distress. Respiratory Normal work of breathing on room air.. Notes 04/01/2023: Her wounds are about the same size today. They have a thick layer of slough on both the medial and lateral aspect. Underneath, however, the tissue is not quite as pale as it usually appears. She does have 3 new wounds on her left lower leg, 1 lateral and 2 medial. They are covered with slough and eschar and are exquisitely painful. Electronic Signature(s) Signed: 04/01/2023 10:20:45 AM By: Duanne Guess MD FACS Entered By: Duanne Guess on 04/01/2023 07:20:45 -------------------------------------------------------------------------------- Physician Orders Details Patient Name: Date of Service: [name], [name] 04/01/2023 8:30 A M Medical Record Number: [id number] Patient Account Number: [id number] Date of Birth/Sex: Treating RN: 1971/03/23 (52 y.o. 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 - Dr. Lady Gary Rm 2 Return A Anesthetic (In clinic) Topical Lidocaine 4% applied to wound bed Bathing/ Shower/ Hygiene May shower and wash wound with soap and water. Additional Orders / Instructions Follow  Nutritious Diet Juven Shake 1-2 times daily. Lymphedema Treatment Plan - Exercise, Compression and Elevation Elevate legs 30 - 60 minutes at or above heart level at least 3 - 4 times daily as able/tolerated Avoid standing for long periods and elevate leg(s) parallel to the floor when sitting Wound Treatment Wound #17 - Lower Leg Wound Laterality: Right, Lateral Cleanser: Soap and Water Every Other Day/30 Days Discharge Instructions: May shower and wash wound with dial antibacterial soap and water prior to dressing change. Cleanser: Wound Cleanser Every Other Day/30 Days Discharge Instructions: Cleanse the wound with wound cleanser prior to applying a clean dressing using gauze sponges, not tissue or cotton balls. IFFANY, MCADAM ([id number]) [id number] Page 8 of 21 Peri-Wound Care: Triamcinolone 15 (g) Every Other Day/30 Days Discharge Instructions: Use triamcinolone 15 (g) as directed Peri-Wound Care: Sween Lotion (Moisturizing lotion) Every Other Day/30 Days Discharge Instructions: Apply moisturizing lotion as directed Topical: Gentamicin Every Other Day/30 Days Discharge Instructions: As directed by physician Topical: Mupirocin Ointment Every Other Day/30 Days Discharge Instructions: Apply Mupirocin (Bactroban) as instructed Prim Dressing: Santyl Ointment Every Other Day/30 Days ary Discharge Instructions: Apply nickel thick amount to wound bed as instructed Secondary Dressing: ABD Pad, 5x9 Every Other Day/30 Days Discharge Instructions: Apply over primary dressing as directed. Secondary Dressing: Woven Gauze Sponge, Non-Sterile 4x4 in Every Other Day/30 Days Discharge Instructions: Moisten with normal saline and Apply over primary dressing as directed. Secured With: Elastic Bandage 4 inch (ACE bandage) Every Other Day/30 Days Discharge Instructions: Secure with ACE bandage as directed. Secured With: [employer], 4.5x3.1 (in/yd) Every Other Day/30  Days Discharge Instructions: Secure with Kerlix as directed. Wound #21 - Ankle Wound Laterality: Right, Medial Cleanser: Soap and Water Every Other Day/30 Days Discharge Instructions: May shower and wash wound with dial antibacterial soap and water prior to dressing change. Cleanser: Wound Cleanser Every Other Day/30 Days Discharge Instructions: Cleanse the wound with wound cleanser prior to applying  a clean dressing using gauze sponges, not tissue or cotton balls. Peri-Wound Care: Triamcinolone 15 (g) Every Other Day/30 Days Discharge Instructions: Use triamcinolone 15 (g) as directed Peri-Wound Care: Sween Lotion (Moisturizing lotion) Every Other Day/30 Days Discharge Instructions: Apply moisturizing lotion as directed Topical: Gentamicin Every Other Day/30 Days Discharge Instructions: As directed by physician Topical: Mupirocin Ointment Every Other Day/30 Days Discharge Instructions: Apply Mupirocin (Bactroban) as instructed Prim Dressing: Santyl Ointment Every Other Day/30 Days ary Discharge Instructions: Apply nickel thick amount to wound bed as instructed Secondary Dressing: ABD Pad, 5x9 Every Other Day/30 Days Discharge Instructions: Apply over primary dressing as directed. Secondary Dressing: Woven Gauze Sponge, Non-Sterile 4x4 in Every Other Day/30 Days Discharge Instructions: Moisten with normal saline and Apply over primary dressing as directed. Secured With: Elastic Bandage 4 inch (ACE bandage) Every Other Day/30 Days Discharge Instructions: Secure with ACE bandage as directed. Secured With: [employer], 4.5x3.1 (in/yd) Every Other Day/30 Days Discharge Instructions: Secure with Kerlix as directed. Wound #26 - Lower Leg Wound Laterality: Left, Lateral Cleanser: Soap and Water Every Other Day/30 Days Discharge Instructions: May shower and wash wound with dial antibacterial soap and water prior to dressing change. Cleanser: Wound Cleanser Every Other Day/30  Days Discharge Instructions: Cleanse the wound with wound cleanser prior to applying a clean dressing using gauze sponges, not tissue or cotton balls. Peri-Wound Care: Triamcinolone 15 (g) Every Other Day/30 Days Discharge Instructions: Use triamcinolone 15 (g) as directed Peri-Wound Care: Sween Lotion (Moisturizing lotion) Every Other Day/30 Days Discharge Instructions: Apply moisturizing lotion as directed Topical: Gentamicin Every Other Day/30 Days Discharge Instructions: As directed by physician [name] ([id number]) [id number] Page 9 of 21 Topical: Mupirocin Ointment Every Other Day/30 Days Discharge Instructions: Apply Mupirocin (Bactroban) as instructed Prim Dressing: Maxorb Extra Ag+ Alginate Dressing, 4x4.75 (in/in) Every Other Day/30 Days ary Discharge Instructions: Apply to wound bed as instructed Prim Dressing: Santyl Ointment Every Other Day/30 Days ary Discharge Instructions: Apply nickel thick amount to wound bed as instructed Secondary Dressing: ABD Pad, 5x9 Every Other Day/30 Days Discharge Instructions: Apply over primary dressing as directed. Secondary Dressing: Woven Gauze Sponge, Non-Sterile 4x4 in Every Other Day/30 Days Discharge Instructions: Moisten with normal saline and Apply over primary dressing as directed. Secured With: Elastic Bandage 4 inch (ACE bandage) Every Other Day/30 Days Discharge Instructions: Secure with ACE bandage as directed. Secured With: [employer], 4.5x3.1 (in/yd) Every Other Day/30 Days Discharge Instructions: Secure with Kerlix as directed. Wound #27 - Lower Leg Wound Laterality: Left, Medial Cleanser: Soap and Water Every Other Day/30 Days Discharge Instructions: May shower and wash wound with dial antibacterial soap and water prior to dressing change. Cleanser: Wound Cleanser Every Other Day/30 Days Discharge Instructions: Cleanse the wound with wound cleanser prior to applying a clean  dressing using gauze sponges, not tissue or cotton balls. Peri-Wound Care: Triamcinolone 15 (g) Every Other Day/30 Days Discharge Instructions: Use triamcinolone 15 (g) as directed Peri-Wound Care: Sween Lotion (Moisturizing lotion) Every Other Day/30 Days Discharge Instructions: Apply moisturizing lotion as directed Topical: Gentamicin Every Other Day/30 Days Discharge Instructions: As directed by physician Topical: Mupirocin Ointment Every Other Day/30 Days Discharge Instructions: Apply Mupirocin (Bactroban) as instructed Prim Dressing: Maxorb Extra Ag+ Alginate Dressing, 4x4.75 (in/in) Every Other Day/30 Days ary Discharge Instructions: Apply to wound bed as instructed Prim Dressing: Santyl Ointment Every Other Day/30 Days ary Discharge Instructions: Apply nickel thick amount to wound bed as instructed Secondary Dressing: ABD Pad, 5x9 Every Other Day/30 Days  Discharge Instructions: Apply over primary dressing as directed. Secondary Dressing: Woven Gauze Sponge, Non-Sterile 4x4 in Every Other Day/30 Days Discharge Instructions: Moisten with normal saline and Apply over primary dressing as directed. Secured With: Elastic Bandage 4 inch (ACE bandage) Every Other Day/30 Days Discharge Instructions: Secure with ACE bandage as directed. Secured With: [employer], 4.5x3.1 (in/yd) Every Other Day/30 Days Discharge Instructions: Secure with Kerlix as directed. Wound #28 - Achilles Wound Laterality: Left Cleanser: Soap and Water Every Other Day/30 Days Discharge Instructions: May shower and wash wound with dial antibacterial soap and water prior to dressing change. Cleanser: Wound Cleanser Every Other Day/30 Days Discharge Instructions: Cleanse the wound with wound cleanser prior to applying a clean dressing using gauze sponges, not tissue or cotton balls. Peri-Wound Care: Triamcinolone 15 (g) Every Other Day/30 Days Discharge Instructions: Use triamcinolone 15 (g) as  directed Peri-Wound Care: Sween Lotion (Moisturizing lotion) Every Other Day/30 Days Discharge Instructions: Apply moisturizing lotion as directed Topical: Gentamicin Every Other Day/30 Days Discharge Instructions: As directed by physician Topical: Mupirocin Ointment Every Other Day/30 Days Discharge Instructions: Apply Mupirocin (Bactroban) as instructed [name] ([id number]) [id number] Page 10 of 21 Prim Dressing: Maxorb Extra Ag+ Alginate Dressing, 4x4.75 (in/in) Every Other Day/30 Days ary Discharge Instructions: Apply to wound bed as instructed Prim Dressing: Santyl Ointment Every Other Day/30 Days ary Discharge Instructions: Apply nickel thick amount to wound bed as instructed Secondary Dressing: ABD Pad, 5x9 Every Other Day/30 Days Discharge Instructions: Apply over primary dressing as directed. Secondary Dressing: Woven Gauze Sponge, Non-Sterile 4x4 in Every Other Day/30 Days Discharge Instructions: Moisten with normal saline and Apply over primary dressing as directed. Secured With: Elastic Bandage 4 inch (ACE bandage) Every Other Day/30 Days Discharge Instructions: Secure with ACE bandage as directed. Secured With: [employer], 4.5x3.1 (in/yd) Every Other Day/30 Days Discharge Instructions: Secure with Kerlix as directed. 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 and concern for adequate inflow to heal ICD10: L97.818 Patient Medications llergies: No Known Allergies A Notifications Medication Indication Start End 04/01/2023 lidocaine DOSE topical 4 % cream - cream topical 04/01/2023 Lidoderm DOSE topical 5 % adhesive patch,medicated - apply above leg wounds for 12 hours per day; may cut to size Electronic Signature(s) Signed: 04/01/2023 10:34:59 AM By: Duanne Guess MD FACS Previous Signature: 04/01/2023 10:24:22 AM Version By: Duanne Guess MD FACS Entered By: Duanne Guess on  04/01/2023 07:24:42 -------------------------------------------------------------------------------- Problem List Details Patient Name: Date of Service: [name], [name] 04/01/2023 8:30 A M Medical Record Number: [id number] Patient Account Number: [id number] Date of Birth/Sex: Treating RN: [birth year] (52 y.o. 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 04/01/2023 No Yes severity 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 04/17/2021 No Yes [name], [name] ([id number]) [id number] Page 11 of 21 Inactive Problems Resolved Problems ICD-10 Code Description Active Date Resolved Date L97.822 Non-pressure chronic ulcer of other part of left lower leg with fat layer exposed 08/09/2022 08/09/2022 Electronic Signature(s) Signed: 04/01/2023 10:12:57 AM By: Duanne Guess MD FACS Entered By: Duanne Guess on 04/01/2023 07:12:57 -------------------------------------------------------------------------------- Progress Note Details Patient Name: Date of Service: [name], [name] 04/01/2023 8:30 A M Medical  Record Number: [id number] Patient Account Number: [id number] Date of Birth/Sex: Treating RN: 21-Feb-1971 (52 y.o. F) Primary Care Provider: Julianne Handler Other Clinician: Referring Provider: Treating Provider/Extender: Koleen Nimrod Weeks in Treatment: 102 Subjective Chief Complaint Information obtained from Patient the patient is here for evaluation of her bilateral lower extremity sickle cell ulcers 04/17/2021; patient comes in for  substantial wounds on the right and left lower leg History of Present Illness (HPI) The following HPI elements were documented for the patient's wound: Location: medial and lateral ankle region on the right and left medial malleolus Quality: Patient reports experiencing a shooting pain to affected area(s). Severity: Patient states wound(s) are getting worse. Duration: right lower extremity bimalleolar ulcers have been present for approximately 2 years; the right medial malleolus ulcer has been there proximally 6 months Timing: Pain in wound is constant (hurts all the time) Context: The wound would happen gradually Associated Signs and Symptoms: Patient reports having increase discharge. 52 year old patient with a history of sickle cell anemia who was last seen by me with ulceration of the right lower extremity above the ankle and was referred to Dr. Marzetta Board for a surgical debridement as I was unable to do anything in the office due to excruciating pain. At that stage she was referred from the plastic surgery service to dermatology who treated her for a skin infection with doxycycline and then Levaquin and a local antibiotic ointment. I understand the patient has since developed ulceration on the left ankle both medial and lateral and was now referred back to the wound center as dermatology has finished the management. I do not have any notes from the dermatology department Old notes: 52 year old patient with a history of sickle cell anemia, pain bilateral lower extremities, right lower extremity ulcer and has a history of receiving a skin graft( Theraskin) several months ago. She has been visiting the wound center [facility] and was seen by Dr. Leanord Hawking and Dr. Marzetta Board. after prolonged conservator therapy between July 2016 and January 2017. She had been seen by the plastic surgeon and taken to the OR for debridement and application of Theraskin. She had 3 applications of Theraskin and was then  treated with collagen. Prior to that she had a history of similar problems in 2014 and was treated conservatively. Had a reflux study done for the right lower extremity in August 2016 without reflux or DVT . Past medical history significant for sickle cell disease, anemia, leg ulcers, cholelithiasis,and has never been a smoker. Once the patient was discharged on the wound center she says within 2 or 3 weeks the problems recurred and she has been treating it conservatively. since I saw her 3 weeks ago at [facility] she has been unable to get her dressing material but has completed a course of doxycycline. 6/7/ 2017 -- lower extremity venous duplex reflux evaluation was done  No evidence of SVT or DVT in the RLL. No venous incompetence in the RLL. No further vascular workup is indicated at this time. She was seen by Dr. Mina Marble, on 10/04/2015. She agreed with the plan of taking her to the OR for debridement and application of theraskin and would also take biopsies to rule out pyoderma gangrenosum. Follow-up note dated May 31 received and she was status post application of Theraskin to multiple ulcers around the right ankle. Pathology did not show evidence of malignancy or pyoderma gangrenosum. She would continue to see as in the wound clinic for further care and see Dr. Marzetta Board as  needed. The patient brought the biopsy report and it was consistent with stasis ulcer no evidence of malignancy and the comment was that there was some adjacent neovascularization, fibrosis and patchy perivascular chronic inflammation. 11/15/2015 -- today we applied her first application of Theraskin 11/30/15; TheraSkin #2 12/13/2015 -- she is having a lot of pain locally and is here for possible application of a theraskin today. 01/16/2016 -- the patient has significant pain and has noticed despite in spite of all local care and oral pain medication. It is impossible to debride her in the office. 02/06/2016 -- I  do not see any notes from Dr. Leta Baptist( the patient has not made a call to the office know as she heard from them) and the only visit to [place], [name] ([id number]) [id number] Page 12 of 21 recently was with her PCP Dr. Gypsy Decant -- I saw her on 01/16/2016 and prescribed 90 tablets of oxycodone 10 mg and did lab work and screening for HIV. the HIV was negative and hemoglobin was 6.3 with a WBC count of 14.9 and hematocrit of 17.8 with platelets of 561. 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 The bimalleolar ulcers to the right lower extremity have been present for approximately 2 years, the left medial malleolus ulcer has been present approximately 6 months. She has followed with Dr.Thimmappa in the past and has had a total of 3 applications of Theraskin (01/2015, 09/2015, 06/17/16). She has also followed with Dr. Meyer Russel here in the clinic and has received 2 applications of TheraSkin (11/10/15, 11/30/15). The patient does experience chronic, and is not amenable to debridement. 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 She is not currently on any antibiotic therapy and has not been treated with any recently. 07/17/2016 -- was seen by Dr. Leta Baptist of plastic surgery who saw her 2 weeks postop application of Theraskin #3. She had removed her dressing and asked her to apply silver alginate on alternate days and follow-up back with the wound center. Future debridements and application of skin substitute would have to be done in the hospital due to her high risk for anesthesia. READMISSION 04/17/2021 Patient is now a 52 year old woman that we have had in this clinic for a prolonged period of time and 2016-2017 and then again for 2 visits in February 2018. At that point she had wounds on the right lower leg  predominantly medial. She had also been seen by plastic surgery Dr. Marzetta Board who I believe took her to the OR for operative debridement and application of TheraSkin in 2017. After she left our clinic she was followed for a very prolonged period of time in the wound care center in [place] who then referred her ultimately to [facility] where she was seen by Dr. Mardene Speak. Again taken her to the OR for skin grafting which apparently did not take. She had multiple other attempts at dressings although I have not really looked over all of these notes in great detail. She has not been seen in a wound care center in about a year. She states over the last year in addition to her right lower leg she has developed wounds on the left lower leg quite extensive. She is using Xeroform to all of these wounds without really any improvement. She also has Medicaid which does not cover wound products. The patient has had vascular work-ups in  the past including most recently on 03/28/2021 showing biphasic waveforms on the right triphasic at the PTA and biphasic at the dorsalis pedis on the left. She was unable to tolerate any degree of compression to do ABIs. Unfortunately TBI's were also not done. She had venous reflux studies done in 2017. This did not show any evidence of a DVT or SVT and no venous incompetence was noted in the right leg at the time this was the only side with the wound As noted I did not look all over her old records. She apparently had a course of HBO and Baptist although I am not sure what the indication would have been. In any case she developed seizures and terminated treatment earlier. She is generally much more disabled than when we last saw her in clinic. She can no longer walk pretty much wheelchair-bound because predominantly of pain in the left hip. 04/24/2021; the patient tolerated the wraps we put on. We used Santyl and Hydrofera Blue under compression. I brought her back for a nurse visit for a  change in dressing. With Medicaid we will have a hard time getting anything paid for and hence the need for compression. She arrives in clinic with all the wounds looking somewhat better in terms of surface 12/20; circumferential wound on the right from the lateral to the medial. She has open areas on the left medial and left lateral x2 on all of this with the same surface. This does not look completely healthy although she does have some epithelialization. She is not complaining of a lot of pain which is unusual for her sickle ulcers. I have not looked over her extensive records from [facility]. She had recent arterial studies and has a history of venous reflux studies I will need to look these over although I do not believe she has significant arterial disease 2023 05/22/2021; patient's wound areas measure slightly smaller. Still a lot of drainage coming from the right we have been using Hydrofera Blue and Santyl with some improvement in the wound surfaces. She tells me she will be getting transfused later in the week for her underlying sickle cell anemia I have looked over her recent arterial studies which were done in the fall. This was in November and showed biphasic and triphasic waveforms but she could not tolerate ABIs because of pressure and unfortunately TBI's were not done. She has not had recent venous reflux studies that I can see 1/10; not much change about the same surface area. This has a yellowish surface to it very gritty. We have been using Santyl and Hydrofera Blue for a prolonged period. Culture I did last week showed methicillin sensitive staph aureus "rare". Our intake nurse reports greenish drainage which may be the Hydrofera Blue itself 1/17; wounds are continue to measure smaller although I am not sure about the accuracy here. Especially the areas on the right are covered in what looks to be a nonviable surface although she does have some epithelialization. Similarly she has  areas on the left medial and left lateral ankle area which appear to have a better surface and perhaps are slightly smaller. We have been using Santyl and Hydrofera Blue. She cannot tolerate mechanical debridements She went for her reflux studies which showed significant reflux at the greater saphenous vein at the saphenofemoral junction as well as the greater saphenous vein in the proximal calf on the left she had reflux in the thigh and the common femoral vein and supra vein Fishel vein  reflux in the greater saphenous vein. I will have vein and vascular look at this. My thoughts have been that these are likely sickle wounds. I looked through her old records from [facility] wound care center and then when she graduated to [facility] wound care center where she saw Dr. Ronda Fairly and Dr. Mardene Speak. Although I can see she had reflux studies done I do not see that she actually saw a vein and vascular. I went over the fact that she had operative debridements and actual skin grafting that did not take. I do not think these wounds have ever really progressed towards healing 1/31;Substantial wounds on the right ankle area. Hyper granulated very gritty adherent debris on the surface. 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 On the left she has no DVT or SVT deep vein reflux in the common femoral vein. Superficial vein reflux in the greater saphenous vein on although the vein diameter is not really all that large. I do not think there is anything that can be done with these although I am going to send her for consultation to vein and vascular. 2/7;  Wound exam; substantial wound area on the right posterior ankle area and areas on the left medial ankle and left lateral ankle. I was able to debride the left medial ankle last week fairly aggressively and it is back this week to a completely nonviable surface She will see vascular surgery this Friday and I would like them to review the venous studies and also any comments on her arterial status. If they do not see an issue here I am going to refer her to plastic surgery for an operative debridement perhaps intraoperative ACell or Integra. Eventually she will require a deep tissue culture again 2/14; substantial wound area on the right posterior ankle, medial ankle. We have been using silver alginate The patient was seen by vein and vascular she had both venous reflux studies and arterial studies. 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 She had no evidence of superficial venous thrombosis. She did have superficial vein reflux at the saphenofemoral junction and the greater saphenous vein. On the left no evidence of a DVT no evidence of superficial venous throat thrombosis she did have deep vein reflux in the common femoral vein and superficial vein reflux in the greater saphenous vein but these were not felt to be amenable to ablation. In terms of arterial studies she had triphasic and wife biphasic wave waveforms bilaterally not felt to have a significant arterial issue. I do not get the feeling [name], [name] ([id number]) [id number] Page 13 of 21 that they felt that any part of her nonhealing wounds were related to either arterial or venous issues. They did note that she had venous reflux at the right at the [facility] and [facility]. And also on the left there were reflux in the deep system at the common femoral vein and greater saphenous vein in the proximal thigh. Nothing amenable to ablation. 2/20; she  is making some decent progress on the right  where there is nice skin between the 2 open areas on the right ankle. The surfaces here do not look viable yet there is some surrounding epithelialization. She still has a small area on the left medial ankle area. Hyper-granulated [name] 2/28 patient has an appointment with plastic surgery on 3/8. We will see her back on 3/9. She may have to call us to get the area redressed. We've been using Santyl under silver alginate. We made a nice improvement on the left medial ankle. The larger wounds on the right also looks somewhat better in terms of epithelialization although I think they could benefit from an aggressive debridement if plastic surgery would be willing to do that. Perhaps placement of Integra or a cell 07/26/2021: She saw Dr. Arita Miss yesterday. He raised the question as to whether or not this might be pyoderma and wanted to wait until that question was answered by dermatology before proceeding with any sort of operative debridement. We have continue to use Santyl under silver alginate with Kerlix and Coban wraps. Overall, her wounds appear to be continuing to contract and epithelialize, with some granulation tissue present. There continues to be some slough on all wound surfaces. 08/09/2021: She has not been able to get an appointment with dermatology because apparently the offices in [facility] and [place] do not accept Medicaid. She is looking into whether or not she can be seen at the main [facility] dermatology clinic. This is necessary because plastic surgery is concerned that her wounds might represent pyoderma and they did not want to do any procedure until that was clarified. We have been using Santyl under silver alginate with Kerlix and Coban wraps. Today, there was a greater amount of drainage on her dressings with a slight green discoloration and significant odor. Despite this, her wounds continue to contract and  epithelialize. There is pale granulation tissue present and actually, on the left medial ankle, the granulation tissue is a bit hypertrophic. 08/16/2021: Last week, I took a culture and this grew back rare methicillin-resistant Staph aureus and rare corynebacterium. The MRSA was sensitive to gentamicin which we began applying topically on an empiric basis. This week, her wounds are a bit smaller and the drainage and odor are less. Her primary care provider is working on assisting the patient with a dermatology evaluation. She has been in silver alginate over the gentamicin that was started last week along with Kerlix and Coban wraps. 08/23/2021: Because she has Medicaid, we have been unable to get her into see any dermatologist in the [place] to rule out pyoderma gangrenosum, which was a requirement from plastic surgery prior to any sort of debridement and grafting. Despite this, however, all of her wounds continue to get smaller. The wound on her left medial ankle is nearly closed. There is no odor from the wounds, although she still accumulates a modest amount of drainage on her dressings. 08/30/2021: The lateral right ankle wound and the medial left ankle wound are a bit smaller today. The medial right ankle wound is about the same size. They are less tender. We have still been unable to get her into dermatology. 09/06/2021: All of the wounds are about the same size today. She continues to endorse minimal pain. I communicated with Dr. Arita Miss in plastic surgery regarding our issues getting a dermatology appointment; he was out of town but indicated that he would look into perhaps performing the biopsy in his office and will have his office contact her. 09/14/2021:  The patient has an appointment in dermatology, but it is not until October. Her wounds are roughly the same; she continues to have very thick purulent-looking drainage on her dressings. 09/20/2021: The left medial wound is nearly closed and just has  a bit of accumulated eschar on the surface. The right medial and lateral ankle wounds are perhaps a little bit smaller. They continue to have a very pale surface with accumulation of thin slough. 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 I did not think Jodie Echevaria was indicated based on this. She is getting topical mupirocin with Prisma silver collagen. 10/04/2021: The patient was not seen in clinic last week due to childcare coverage issues. In the interim, the left medial leg wound has closed. The right sided leg wounds are smaller. There is more granulation tissue coming through, particularly on the lateral wound. The surface remains somewhat gritty. We have been applying topical mupirocin and Prisma silver collagen. 10/11/2021: The left medial leg wound remains closed. She does complain of some anesthetic sensation to the area. Both of the right-sided leg wounds are smaller but still have accumulated slough. 10/18/2021: Both right-sided leg wounds are minimally smaller this week. She still continues to accumulate slough and has thick drainage on her dressings. 10/23/2021: Both wounds continue to contract. There is still slough buildup. She has been approved for a keratin-based skin substitute trial product but it will not be available until next week. 10/30/2021: The wounds are about the same to perhaps slightly smaller. There is still continued slough buildup. Unfortunately, the rep for the keratin based product did not show up today and did not answer his phone when called. 11/08/2021: The wounds are little bit smaller today. She continues to have thick drainage but the surfaces are relatively clean with just a little bit of slough accumulation. She reported to me today that she is unable to completely flex her left ankle and on examination it seems this is potentially related to scar tissue from her wounds. We do have the ProgenaMatrix trial product available for her  today. 11/15/2021: Both wounds are smaller today. There is some slough accumulation on the surfaces, but the medial wound, in particular looks like it is filling in and is less deep. She did hear from physical therapy and she is going to start working with them on July 11. She is here for her second application of the trial skin substitute, ProgenaMatrix. 11/22/2021: Both wounds continue to contract, the medial more dramatically than the lateral. Both wounds have a layer of slough on the surface, but underneath this, the gritty fibrous tissue has a little bit more of a pink cast to it rather than being as pale as it has been. 11/29/2021: The wounds are roughly the same size this week, perhaps a millimeter or 2 smaller. The medial wound has filled in and is nearly flush with the surrounding skin surface. She continues to have a lot of slough accumulation on both surfaces. 12/06/2021: No significant change to her wounds, but she has a new opening on her dorsal foot, just distal to the right lateral ankle wound. The area on her left medial ankle that reopened looks a little bit larger today. She has quite a bit of pain associated with the new wound. 12/12/2021: Her wounds look about the same but the new opening on her right lateral dorsal foot is a little bit bigger. She continues to have a fair amount of pain with this wound. 12/26/2021:  The left medial ankle wound is tiny and superficial. She has 2 areas of crusting on her left lateral ankle, however, that appear to be threatening to open again. Her right medial ankle wound is a little bit smaller today but still continues to accumulate thick rubbery slough. The new dorsal foot wound is exquisitely painful but there is no odor or purulent drainage. No erythema or induration. The right lateral ankle wound looks about the same today, again with thick rubbery slough. 01/03/2022: The left medial ankle wound has closed again. Both right ankle wounds appear to be  about the same size with thick rubbery slough. The dorsal foot wound on the right continues to be quite painful and she stated that she did not want any debridement of that site today. 01/10/2022: No real change to any of her wounds. She continues to accumulate thick slough. The dorsal foot wound has merged with the lateral malleolar wound. She is experiencing significant pain in the dorsal foot portion of the ulcer. DELOROS, TARDIFF ([id number]) [id number] Page 14 of 21 01/16/2022: Absolutely no change or progress in her wounds. 01/24/2022: Her wounds are unchanged. She continues to build up slough and the wounds on her dorsal right foot are still exquisitely tender. 01/31/2022: The wounds actually measure a little bit narrower today. They still have thick slough on the surface but the underlying tissue seems a little less fibrotic. We changed to Iodosorb last week. 02/07/2022: Wounds continue to slowly epithelialize around the parameters. She has less pain today. She still accumulates a fairly substantial layer of slough. 02/15/2022: No change to the wounds overall. The measured a little bit larger today per the intake nurse. She continues to have substantial slough accumulation and her pain is a little bit worse. 02/21/2022. No change at all to her wounds. I do not see that plastic surgery has received her referral yet. 02/28/2022: The wounds remain unchanged. They are dry and fibrotic with accumulation of slough and eschar. She did receive an appointment to see plastic surgery on October 23, but the office called her back and indicated they needed to reschedule and that the next available appointment was not until December. The patient became angry and decided she did not want to see this plastics group. 10/19; the patient sees Dr. Maylene Roes again next week. She is using Medihoney as the primary dressing changing this herself. 03/21/2022: She saw Dr. Ferd Hibbs at the wound care  center at [facility]. Dr. Ferd Hibbs was also unable to debride her, secondary to pain and is planning to take her to the operating room for operative debridement and potential skin substitute placement. Her wounds are unchanged with thick yellow slough and a fibrotic base. She says they are too painful to be debrided. In addition, yesterday her hemoglobin was 4 and she received 2 units packed red blood cell transfusion. 04/04/2022: Her operation at [facility] is scheduled for December 15. She continues to use Medihoney on her wounds. They are a little bit less painful today and she is willing to entertain the possibility of debridement. The wounds measured a little bit larger in all dimensions today but the layer of slough is not as thick as usual. 04/18/2022: Her wounds actually measured a little bit smaller today and the extension onto the dorsal foot from the lateral wound has healed. They are less tender and there is significantly less slough present as compared to prior visits. 05/09/2022: She had to reschedule her surgery due to lack of  childcare. It is now planned for January 5. The wounds are basically unchanged, but she had a lot more drainage, which was thick and somewhat purulent in appearance. No significant odor. 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 They are quite painful today and she does not want to have debridement performed. 05/30/2022: The patient underwent surgical debridement and placement of TheraSkin to her wounds on January 5. She has been doing well and does not endorse any pain. The TheraSkin is intact and looks beautiful. It is sutured in place. There is no exudate or significant drainage. 06/04/2022: Her TheraSkin remains intact and I am starting to see granulation tissue emerging underneath the surface. No concern for infection. 06/12/2022: The TheraSkin is intact and there is more granulation tissue emerging. It is starting to look a  little bit dry, however. No malodor or purulent drainage. 06/19/2022: The TheraSkin continues to look good. The edges are a bit dry, but the central portion of each of her wounds is showing a nice pink color. 06/27/2022: The TheraSkin on the lateral wound remains almost completely intact. There is nice pink tissue peaking through the mesh of the TheraSkin. On the medial leg, it is starting to breakdown a little bit, but most of the TheraSkin remains intact here, as well. Both wounds measured smaller today. 07/03/2022: The wounds are fairly dry around the perimeter today. Some of the suture is hanging loose. 07/12/2022: Both wounds are measuring smaller today. The medial ulcer is getting a bit too dry. The lateral ulcer continues to have nice buds of granulation tissue emerging. 07/26/2022: The moisture balance on both wounds is much better today. For the first time since I began seeing her, I see actual beefy red granulation tissue on the lateral ankle wound. There are more buds of deep pink granulation tissue emerging on the medial ankle. There is some dry eschar around the edges of the medial wound and a tiny amount of slough overlying the good granulation tissue on the lateral wound. 08/09/2022: Unfortunately, she has a new wound on her left lateral lower leg, just above the ankle. It is extremely painful for her. It penetrates to the fat layer and has the same woody, fibrotic character as her previous wounds. The other wounds are both a little bit smaller with slough and some eschar around the periphery. 08/14/2022: Her right lateral ankle wound is a little bit smaller. Unfortunately, she has had an increase in her drainage and it has a purulent nature to it, similar to what was present prior to her surgical debridement. She also reports odor coming from her wounds this week. The left lateral leg wound remains extremely tender. 08/21/2022: The left lateral ankle wound is larger and more painful today. Both  right ankle wounds have a little bit more vital appearance, but they have accumulated more slough. She reports less odor this week. She is currently taking Augmentin. 08/28/2022: The left lateral ankle wound continues to enlarge and remains quite painful. Both of the right ankle wounds actually look better this week. There is improved tissue quality with less slough accumulation. 09/27/2022: The left lateral ankle wound actually looks nearly healed. She says that she has been applying some silver alginate that she had leftover. She was unable to afford the Santyl that was prescribed, but found a tube that she had on hand at home and has been using this on her right ankle wounds. These wounds look about the same and have accumulated the usual layer of slough.  Unfortunately, a portion of the dorsal foot wound has reopened and is exquisitely painful today. 10/09/2022: The left lateral ankle wound is healed. The dorsal foot wound appears to have expanded and remains exquisitely painful. The medial and lateral right ankle wounds look about the same size, but they are quite a bit cleaner. 10/23/2022: The left lateral ankle remains closed. The dorsal foot wound has expanded to the point that it now converges with the right lateral leg wound. She has accumulated fairly thick slough on all of the open surfaces. 11/06/2022: No significant change to any of her wounds, although the dorsal foot aspect of the right lateral wound is a little bit smaller. She has been approved for the [employer] and [employer] for [employer] and will be able to get a full year's supply. 11/20/2022: The dorsal foot aspect of the right lateral wound has almost completely closed. I am seeing nice pink tissue starting to emerge from the fibrotic surface of both wounds. The medial leg wound appears to be a little bit smaller today. She has been using Santyl. 12/04/2022: The dorsal foot aspect of the right lateral wound has closed. There is  more pink granulation tissue emerging on her wounds. There are buds of epithelium beginning to fill in from the edges of her wound, particularly at the proximal aspect of the medial wound and the distal aspect of the lateral wound. Significantly less slough formation than in the past. CASSIOPEIA, LLANEZ ([id number]) [id number] Page 15 of 21 12/18/2022: The wounds measured about the same size today, but there is better granulation tissue filling in and the overall surface consistency is improving. She does have slough accumulation on the surfaces, but no malodor or purulent drainage. 01/01/2023: No significant change to her wounds. The surface seems a little bit more fibrotic today. She is complaining of more pain. 01/15/2023: Her wounds are stable today. She has been taking Augmentin based on the culture that I took at her previous visit. She says that she has felt physically better since being on the antibiotic and has had less drainage from her wounds. 01/29/2023: The wounds are unchanged and remained fibrotic and painful. She does report having less pain while on antibiotics and less drainage, as well. 02/07/2023: Her wounds have gotten larger and more painful. She completed her course of antibiotics yesterday. She still has a fair amount of drainage coming from her wounds. 02/24/2023: Her wounds continue to enlarge. They are more painful. 03/18/2023: Her wounds continue to enlarge and become more painful. The systemic antibiotics do not seem to have made any difference this round. She is having more drainage, as well. 04/01/2023: Her wounds are about the same size today. They have a thick layer of slough on both the medial and lateral aspect. Underneath, however, the tissue is not quite as pale as it usually appears. She does have 3 new wounds on her left lower leg, 1 lateral and 2 medial. They are covered with slough and eschar and are exquisitely painful. She has been  applying silver alginate to these at home. Her case was discussed in the [facility] conference recently. Several suggestions were made including Lidoderm patches, vascular referral for an angiogram, and potentially further surgery. They also suggested getting a TCOM done with the Lidoderm patch in situ. They agreed with the decision to put her on pentoxifylline. Patient History Information obtained from Patient. Family History Diabetes - Mother, Lung Disease - Mother, No family history of Cancer, Heart Disease, Hereditary Spherocytosis,  Hypertension, Kidney Disease, Seizures, Stroke, Thyroid Problems, Tuberculosis. Social History Never smoker, Marital Status - Married, Alcohol Use - Never, Drug Use - No History, Caffeine Use - Daily. Medical History Eyes Denies history of Cataracts, Glaucoma, Optic Neuritis Ear/Nose/Mouth/Throat Denies history of Chronic sinus problems/congestion, Middle ear problems Hematologic/Lymphatic Patient has history of Anemia, Sickle Cell Disease Denies history of Hemophilia, Human Immunodeficiency Virus, Lymphedema Respiratory Denies history of Aspiration, Asthma, Chronic Obstructive Pulmonary Disease (COPD), Pneumothorax, Sleep Apnea, Tuberculosis Cardiovascular Denies history of Angina, Arrhythmia, Congestive Heart Failure, Coronary Artery Disease, Deep Vein Thrombosis, Hypertension, Hypotension, Myocardial Infarction, Peripheral Arterial Disease, Peripheral Venous Disease, Phlebitis, Vasculitis Gastrointestinal Denies history of Cirrhosis , Colitis, Crohns, Hepatitis A, Hepatitis B, Hepatitis C Endocrine Denies history of Type I Diabetes, Type II Diabetes Genitourinary Denies history of End Stage Renal Disease Immunological Denies history of Lupus Erythematosus, Raynauds, Scleroderma Integumentary (Skin) Denies history of History of Burn Musculoskeletal Denies history of Gout, Rheumatoid Arthritis, Osteoarthritis,  Osteomyelitis Neurologic Patient has history of Neuropathy - right foot intermittant Denies history of Dementia, Quadriplegia, Paraplegia, Seizure Disorder Oncologic Denies history of Received Chemotherapy, Received Radiation Psychiatric Denies history of Anorexia/bulimia, Confinement Anxiety Hospitalization/Surgery History - c section x2. - left breast lumpectomy. - iandD right ankle with theraskin. Medical A Surgical History Notes nd Constitutional Symptoms (General Health) H/O miscarriage Cardiovascular bradycardia Gastrointestinal cholilithiasis Objective Constitutional [name], [name] ([id number]) [id number] Page 16 of 21 Slightly tachycardic. no acute distress. Vitals Time Taken: 8:42 AM, Height: 67 in, Weight: 134 lbs, BMI: 21, Temperature: 97.9 F, Pulse: 104 bpm, Respiratory Rate: 16 breaths/min, Blood Pressure: 128/64 mmHg. Respiratory Normal work of breathing on room air.. General Notes: 04/01/2023: Her wounds are about the same size today. They have a thick layer of slough on both the medial and lateral aspect. Underneath, however, the tissue is not quite as pale as it usually appears. She does have 3 new wounds on her left lower leg, 1 lateral and 2 medial. They are covered with slough and eschar and are exquisitely painful. Integumentary (Hair, Skin) Wound #17 status is Open. Original cause of wound was Gradually Appeared. The date acquired was: 10/05/2012. The wound has been in treatment 102 weeks. The wound is located on the Right,Lateral Lower Leg. The wound measures 10cm length x 7.7cm width x 0.2cm depth; 60.476cm^2 area and 12.095cm^3 volume. There is Fat Layer (Subcutaneous Tissue) exposed. There is no tunneling or undermining noted. There is a large amount of serosanguineous drainage noted. The wound margin is distinct with the outline attached to the wound base. There is small (1-33%) pink granulation within the wound bed. There is a  large (67-100%) amount of necrotic tissue within the wound bed including Adherent Slough. The periwound skin appearance had no abnormalities noted for moisture. The periwound skin appearance exhibited: Scarring, Hemosiderin Staining. The periwound skin appearance did not exhibit: Ecchymosis. Periwound temperature was noted as No Abnormality. The periwound has tenderness on palpation. Wound #21 status is Open. Original cause of wound was Gradually Appeared. The date acquired was: 06/26/2021. The wound has been in treatment 92 weeks. The wound is located on the Right,Medial Ankle. The wound measures 12.5cm length x 5.6cm width x 0.2cm depth; 54.978cm^2 area and 10.996cm^3 volume. There is Fat Layer (Subcutaneous Tissue) exposed. There is no tunneling or undermining noted. There is a large amount of serosanguineous drainage noted. The wound margin is distinct with the outline attached to the wound base. There is small (1-33%) pink granulation within the wound bed. There is a large (  67- 100%) amount of necrotic tissue within the wound bed including Adherent Slough. The periwound skin appearance had no abnormalities noted for moisture. The periwound skin appearance had no abnormalities noted for color. The periwound skin appearance exhibited: Scarring. Periwound temperature was noted as No Abnormality. The periwound has tenderness on palpation. Wound #26 status is Open. Original cause of wound was Gradually Appeared. The date acquired was: 03/18/2023. The wound is located on the Left,Lateral Lower Leg. The wound measures 2.5cm length x 1.8cm width x 0.1cm depth; 3.534cm^2 area and 0.353cm^3 volume. There is Fat Layer (Subcutaneous Tissue) exposed. There is no tunneling or undermining noted. There is a medium amount of serous drainage noted. The wound margin is distinct with the outline attached to the wound base. There is small (1-33%) pink granulation within the wound bed. There is a large (67-100%) amount  of necrotic tissue within the wound bed including Adherent Slough. The periwound skin appearance had no abnormalities noted for moisture. The periwound skin appearance had no abnormalities noted for color. The periwound skin appearance exhibited: Scarring. Periwound temperature was noted as No Abnormality. The periwound has tenderness on palpation. Wound #27 status is Open. Original cause of wound was Gradually Appeared. The date acquired was: 03/18/2023. The wound is located on the Left,Medial Lower Leg. The wound measures 1.1cm length x 1.2cm width x 0.1cm depth; 1.037cm^2 area and 0.104cm^3 volume. There is Fat Layer (Subcutaneous Tissue) exposed. There is no tunneling or undermining noted. There is a medium amount of serous drainage noted. The wound margin is distinct with the outline attached to the wound base. There is small (1-33%) pink granulation within the wound bed. There is a large (67-100%) amount of necrotic tissue within the wound bed including Adherent Slough. The periwound skin appearance had no abnormalities noted for moisture. The periwound skin appearance had no abnormalities noted for color. The periwound skin appearance exhibited: Scarring. Periwound temperature was noted as No Abnormality. The periwound has tenderness on palpation. Wound #28 status is Open. Original cause of wound was Gradually Appeared. The date acquired was: 03/18/2023. The wound is located on the Left Achilles. The wound measures 1.6cm length x 1.3cm width x 0.1cm depth; 1.634cm^2 area and 0.163cm^3 volume. There is Fat Layer (Subcutaneous Tissue) exposed. There is no tunneling or undermining noted. There is a medium amount of serous drainage noted. The wound margin is distinct with the outline attached to the wound base. There is small (1-33%) pink granulation within the wound bed. There is a large (67-100%) amount of necrotic tissue within the wound bed including Adherent Slough. The periwound skin  appearance had no abnormalities noted for texture. The periwound skin appearance had no abnormalities noted for moisture. The periwound skin appearance had no abnormalities noted for color. Periwound temperature was noted as No Abnormality. The periwound has tenderness on palpation. 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 There was a Excisional Skin/Subcutaneous Tissue Debridement with a total area of 60.44 sq cm performed by Duanne Guess, MD. With the following instrument(s): Curette to remove Non-Viable tissue/material. Material removed includes Subcutaneous Tissue and Slough and after achieving pain control using Lidocaine 4% T opical Solution. No specimens were taken. A time out was conducted at 09:10,  prior to the start of the procedure. A Minimum amount of bleeding was controlled with Pressure. The procedure was tolerated well. Post Debridement Measurements: 10cm length x 7.7cm width x 0.2cm depth; 12.095cm^3 volume. Character of Wound/Ulcer Post Debridement requires further debridement. Post procedure Diagnosis Wound #17: Same as Pre-Procedure Wound #21 Pre-procedure diagnosis of Wound #21 is a Sickle Cell Lesion located on the Right,Medial Ankle .Severity of Tissue Pre Debridement is: Fat layer exposed. There was a Excisional Skin/Subcutaneous Tissue Debridement with a total area of 54.95 sq cm performed by Duanne Guess, MD. With the following instrument(s): Curette to remove Non-Viable tissue/material. Material removed includes Subcutaneous Tissue and Slough and after achieving pain control using Lidocaine 4% T opical Solution. No specimens were taken.  A time out was conducted at 09:10, prior to the start of the procedure. A Minimum amount of bleeding was controlled with Pressure. The procedure was tolerated well. Post Debridement Measurements: 12.5cm length x 5.6cm width x 0.2cm depth; [name] ([id number]) [id number] Page 17 of 21 10.996cm^3 volume. Character of Wound/Ulcer Post Debridement requires further debridement. Severity of Tissue Post Debridement is: Fat layer exposed. Post procedure Diagnosis Wound #21: Same as Pre-Procedure Plan Follow-up Appointments: Return Appointment in 2 weeks. - Dr. Lady Gary Rm 2 Anesthetic: (In clinic) Topical Lidocaine 4% applied to wound bed Bathing/ Shower/ Hygiene: May shower and wash wound with soap and water. Additional Orders / Instructions: Follow Nutritious Diet Juven Shake 1-2 times daily. Lymphedema Treatment Plan - Exercise, Compression and Elevation: Elevate legs 30 - 60 minutes at or above heart level at least 3 - 4 times daily as able/tolerated Avoid standing for long periods and elevate leg(s) parallel to the floor when sitting Consults ordered were: Vascular Surgery - consult for angiogram due to chronic sickle cell ulcers and concern for adequate inflow to heal ICD10: L97.818 The following medication(s) was prescribed: lidocaine topical 4 % cream cream topical was prescribed at facility Lidoderm topical 5 % adhesive patch,medicated apply above leg wounds for 12 hours per day; may cut to size starting 04/01/2023 WOUND #17: - Lower Leg Wound Laterality: Right, Lateral Cleanser: Soap and Water Every Other Day/30 Days Discharge Instructions: May shower and wash wound with dial antibacterial soap and water prior to dressing change. Cleanser: Wound Cleanser Every Other Day/30 Days Discharge Instructions: Cleanse the wound with wound cleanser prior to applying a clean dressing using gauze sponges, not tissue or cotton balls. Peri-Wound Care: Triamcinolone  15 (g) Every Other Day/30 Days Discharge Instructions: Use triamcinolone 15 (g) as directed Peri-Wound Care: Sween Lotion (Moisturizing lotion) Every Other Day/30 Days Discharge Instructions: Apply moisturizing lotion as directed Topical: Gentamicin Every Other Day/30 Days Discharge Instructions: As directed by physician Topical: Mupirocin Ointment Every Other Day/30 Days Discharge Instructions: Apply Mupirocin (Bactroban) as instructed Prim Dressing: Santyl Ointment Every Other Day/30 Days ary Discharge Instructions: Apply nickel thick amount to wound bed as instructed Secondary Dressing: ABD Pad, 5x9 Every Other Day/30 Days Discharge Instructions: Apply over primary dressing as directed. Secondary Dressing: Woven Gauze Sponge, Non-Sterile 4x4 in Every Other Day/30 Days Discharge Instructions: Moisten with normal saline and Apply over primary dressing as directed. Secured With: Elastic Bandage 4 inch (ACE bandage) Every Other Day/30 Days Discharge Instructions: Secure with ACE bandage as directed. Secured With: [employer], 4.5x3.1 (in/yd) Every Other Day/30 Days Discharge Instructions: Secure with Kerlix as directed. WOUND #21: - Ankle Wound Laterality: Right, Medial Cleanser: Soap and Water Every Other Day/30 Days Discharge Instructions: May shower and wash wound with  dial antibacterial soap and water prior to dressing change. Cleanser: Wound Cleanser Every Other Day/30 Days Discharge Instructions: Cleanse the wound with wound cleanser prior to applying a clean dressing using gauze sponges, not tissue or cotton balls. Peri-Wound Care: Triamcinolone 15 (g) Every Other Day/30 Days Discharge Instructions: Use triamcinolone 15 (g) as directed Peri-Wound Care: Sween Lotion (Moisturizing lotion) Every Other Day/30 Days Discharge Instructions: Apply moisturizing lotion as directed Topical: Gentamicin Every Other Day/30 Days Discharge Instructions: As directed by physician Topical:  Mupirocin Ointment Every Other Day/30 Days Discharge Instructions: Apply Mupirocin (Bactroban) as instructed Prim Dressing: Santyl Ointment Every Other Day/30 Days ary Discharge Instructions: Apply nickel thick amount to wound bed as instructed Secondary Dressing: ABD Pad, 5x9 Every Other Day/30 Days Discharge Instructions: Apply over primary dressing as directed. Secondary Dressing: Woven Gauze Sponge, Non-Sterile 4x4 in Every Other Day/30 Days Discharge Instructions: Moisten with normal saline and Apply over primary dressing as directed. Secured With: Elastic Bandage 4 inch (ACE bandage) Every Other Day/30 Days Discharge Instructions: Secure with ACE bandage as directed. Secured With: [employer], 4.5x3.1 (in/yd) Every Other Day/30 Days Discharge Instructions: Secure with Kerlix as directed. WOUND #26: - Lower Leg Wound Laterality: Left, Lateral Cleanser: Soap and Water Every Other Day/30 Days Discharge Instructions: May shower and wash wound with dial antibacterial soap and water prior to dressing change. Cleanser: Wound Cleanser Every Other Day/30 Days Discharge Instructions: Cleanse the wound with wound cleanser prior to applying a clean dressing using gauze sponges, not tissue or cotton balls. Peri-Wound Care: Triamcinolone 15 (g) Every Other Day/30 Days Discharge Instructions: Use triamcinolone 15 (g) as directed Peri-Wound Care: Sween Lotion (Moisturizing lotion) Every Other Day/30 Days Discharge Instructions: Apply moisturizing lotion as directed Topical: Gentamicin Every Other Day/30 Days Discharge Instructions: As directed by physician Topical: Mupirocin Ointment Every Other Day/30 Days Discharge Instructions: Apply Mupirocin (Bactroban) as instructed Prim Dressing: Maxorb Extra Ag+ Alginate Dressing, 4x4.75 (in/in) Every Other Day/30 Days ary Discharge Instructions: Apply to wound bed as instructed Prim Dressing: Santyl Ointment Every Other Day/30 Days ary Discharge  Instructions: Apply nickel thick amount to wound bed as instructed [name] ([id number]) [id number].pdf Page 18 of 21 Secondary Dressing: ABD Pad, 5x9 Every Other Day/30 Days Discharge Instructions: Apply over primary dressing as directed. Secondary Dressing: Woven Gauze Sponge, Non-Sterile 4x4 in Every Other Day/30 Days Discharge Instructions: Moisten with normal saline and Apply over primary dressing as directed. Secured With: Elastic Bandage 4 inch (ACE bandage) Every Other Day/30 Days Discharge Instructions: Secure with ACE bandage as directed. Secured With: [employer], 4.5x3.1 (in/yd) Every Other Day/30 Days Discharge Instructions: Secure with Kerlix as directed. WOUND #27: - Lower Leg Wound Laterality: Left, Medial Cleanser: Soap and Water Every Other Day/30 Days Discharge Instructions: May shower and wash wound with dial antibacterial soap and water prior to dressing change. Cleanser: Wound Cleanser Every Other Day/30 Days Discharge Instructions: Cleanse the wound with wound cleanser prior to applying a clean dressing using gauze sponges, not tissue or cotton balls. Peri-Wound Care: Triamcinolone 15 (g) Every Other Day/30 Days Discharge Instructions: Use triamcinolone 15 (g) as directed Peri-Wound Care: Sween Lotion (Moisturizing lotion) Every Other Day/30 Days Discharge Instructions: Apply moisturizing lotion as directed Topical: Gentamicin Every Other Day/30 Days Discharge Instructions: As directed by physician Topical: Mupirocin Ointment Every Other Day/30 Days Discharge Instructions: Apply Mupirocin (Bactroban) as instructed Prim Dressing: Maxorb Extra Ag+ Alginate Dressing, 4x4.75 (in/in) Every Other Day/30 Days ary Discharge Instructions: Apply to wound bed as instructed Prim Dressing:  Santyl Ointment Every Other Day/30 Days ary Discharge Instructions: Apply nickel thick amount to wound bed as instructed Secondary Dressing: ABD Pad,  5x9 Every Other Day/30 Days Discharge Instructions: Apply over primary dressing as directed. Secondary Dressing: Woven Gauze Sponge, Non-Sterile 4x4 in Every Other Day/30 Days Discharge Instructions: Moisten with normal saline and Apply over primary dressing as directed. Secured With: Elastic Bandage 4 inch (ACE bandage) Every Other Day/30 Days Discharge Instructions: Secure with ACE bandage as directed. Secured With: [employer], 4.5x3.1 (in/yd) Every Other Day/30 Days Discharge Instructions: Secure with Kerlix as directed. WOUND #28: - Achilles Wound Laterality: Left Cleanser: Soap and Water Every Other Day/30 Days Discharge Instructions: May shower and wash wound with dial antibacterial soap and water prior to dressing change. Cleanser: Wound Cleanser Every Other Day/30 Days Discharge Instructions: Cleanse the wound with wound cleanser prior to applying a clean dressing using gauze sponges, not tissue or cotton balls. Peri-Wound Care: Triamcinolone 15 (g) Every Other Day/30 Days Discharge Instructions: Use triamcinolone 15 (g) as directed Peri-Wound Care: Sween Lotion (Moisturizing lotion) Every Other Day/30 Days Discharge Instructions: Apply moisturizing lotion as directed Topical: Gentamicin Every Other Day/30 Days Discharge Instructions: As directed by physician Topical: Mupirocin Ointment Every Other Day/30 Days Discharge Instructions: Apply Mupirocin (Bactroban) as instructed Prim Dressing: Maxorb Extra Ag+ Alginate Dressing, 4x4.75 (in/in) Every Other Day/30 Days ary Discharge Instructions: Apply to wound bed as instructed Prim Dressing: Santyl Ointment Every Other Day/30 Days ary Discharge Instructions: Apply nickel thick amount to wound bed as instructed Secondary Dressing: ABD Pad, 5x9 Every Other Day/30 Days Discharge Instructions: Apply over primary dressing as directed. Secondary Dressing: Woven Gauze Sponge, Non-Sterile 4x4 in Every Other Day/30 Days Discharge  Instructions: Moisten with normal saline and Apply over primary dressing as directed. Secured With: Elastic Bandage 4 inch (ACE bandage) Every Other Day/30 Days Discharge Instructions: Secure with ACE bandage as directed. Secured With: [employer], 4.5x3.1 (in/yd) Every Other Day/30 Days Discharge Instructions: Secure with Kerlix as directed. 04/01/2023: Her wounds are about the same size today. They have a thick layer of slough on both the medial and lateral aspect. Underneath, however, the tissue is not quite as pale as it usually appears. She does have 3 new wounds on her left lower leg, 1 lateral and 2 medial. They are covered with slough and eschar and are exquisitely painful. She has been applying silver alginate to these at home. Her case was discussed in the [facility] conference recently. Several suggestions were made including Lidoderm patches, vascular referral for an angiogram, and potentially further surgery. They also suggested getting a TCOM done with the Lidoderm patch in situ. They agreed with the decision to put her on pentoxifylline. I used a curette to debride slough and subcutaneous tissue from both of the right leg wounds. The patient declined debridement on her medial left leg wounds. We will extend the treatment of topical gentamicin and mupirocin mixed with Santyl to the left leg wounds and continuing on the right leg wounds. I have sent in a prescription for lidocaine patches for her to apply proximal to her ulcers. We will also place a referral to vascular surgery to discuss angiography. Once she has the Lidoderm patches on we can consider getting a TCOM. Follow-up in 2 weeks. Electronic Signature(s) Signed: 04/01/2023 10:26:29 AM By: Duanne Guess MD FACS Entered By: Duanne Guess on 04/01/2023 78:29:56 [id number], QIONG ([id number]) [id number] Page 19 of  21 -------------------------------------------------------------------------------- HxROS Details Patient Name: Date of Service: [name]  URO [name], [name] 04/01/2023 8:30 A M Medical Record Number: [id number] Patient Account Number: [id number] Date of Birth/Sex: Treating RN: [birth year] (52 y.o. F) Primary Care Provider: Julianne Handler Other Clinician: Referring Provider: Treating Provider/Extender: Koleen Nimrod Weeks in Treatment: 102 Information Obtained From Patient Constitutional Symptoms (General Health) Medical History: Past Medical History Notes: H/O miscarriage Eyes Medical History: Negative for: Cataracts; Glaucoma; Optic Neuritis Ear/Nose/Mouth/Throat Medical History: Negative for: Chronic sinus problems/congestion; Middle ear problems Hematologic/Lymphatic Medical History: Positive for: Anemia; Sickle Cell Disease Negative for: Hemophilia; Human Immunodeficiency Virus; Lymphedema Respiratory Medical History: Negative for: Aspiration; Asthma; Chronic Obstructive Pulmonary Disease (COPD); Pneumothorax; Sleep Apnea; Tuberculosis Cardiovascular Medical History: Negative for: Angina; Arrhythmia; Congestive Heart Failure; Coronary Artery Disease; Deep Vein Thrombosis; Hypertension; Hypotension; Myocardial Infarction; Peripheral Arterial Disease; Peripheral Venous Disease; Phlebitis; Vasculitis Past Medical History Notes: bradycardia Gastrointestinal Medical History: Negative for: Cirrhosis ; Colitis; Crohns; Hepatitis A; Hepatitis B; Hepatitis C Past Medical History Notes: cholilithiasis Endocrine Medical History: Negative for: Type I Diabetes; Type II Diabetes Genitourinary Medical History: Negative for: End Stage Renal Disease Immunological Medical History: Negative for: Lupus Erythematosus; Raynauds; Scleroderma Integumentary (Skin) Medical History: Negative for: History of Burn Musculoskeletal Medical History: Negative for: Gout; Rheumatoid  Arthritis; Osteoarthritis; Osteomyelitis [name] ([id number]) [id number].pdf Page 20 of 21 Neurologic Medical History: Positive for: Neuropathy - right foot intermittant Negative for: Dementia; Quadriplegia; Paraplegia; Seizure Disorder Oncologic Medical History: Negative for: Received Chemotherapy; Received Radiation Psychiatric Medical History: Negative for: Anorexia/bulimia; Confinement Anxiety Immunizations Pneumococcal Vaccine: Received Pneumococcal Vaccination: No Implantable Devices None Hospitalization / Surgery History Type of Hospitalization/Surgery c section x2 left breast lumpectomy iandD right ankle with theraskin Family and Social History Cancer: No; Diabetes: Yes - Mother; Heart Disease: No; Hereditary Spherocytosis: No; Hypertension: No; Kidney Disease: No; Lung Disease: Yes - Mother; Seizures: No; Stroke: No; Thyroid Problems: No; Tuberculosis: No; Never smoker; Marital Status - Married; Alcohol Use: Never; Drug Use: No History; Caffeine Use: Daily; Financial Concerns: No; Food, Clothing or Shelter Needs: No; Support System Lacking: No; Transportation Concerns: No Electronic Signature(s) Signed: 04/01/2023 10:34:59 AM By: Duanne Guess MD FACS Entered By: Duanne Guess on 04/01/2023 07:20:02 -------------------------------------------------------------------------------- SuperBill Details Patient Name: Date of Service: [name], [name] 04/01/2023 Medical Record Number: [id number] Patient Account Number: [id number] Date of Birth/Sex: Treating RN: Sep 20, 1970 (52 y.o. F) Primary Care Provider: Julianne Handler Other Clinician: Referring Provider: Treating Provider/Extender: Koleen Nimrod Weeks in Treatment: 102 Diagnosis Coding ICD-10 Codes Code Description [phone] Non-pressure chronic ulcer of other part of right lower leg with other specified severity 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 L97.828  Non-pressure chronic ulcer of other part of left lower leg with other specified severity Facility Procedures : CPT4 Code: [id number] Description: 11042 - DEB SUBQ TISSUE 20 SQ CM/< ICD-10 Diagnosis Description L97.818 Non-pressure chronic ulcer of other part of right lower leg with other specified Modifier: severity Quantity: 1 : Copher CPT4 Code: [id number] , Kristin ([id number] L Description: 11045 - DEB SUBQ TISS EA ADDL 20CM ICD-10 Diagnosis Description ) [id number] 97.818 Non-pressure chronic ulcer of other part of right lower leg with other specified seve Modifier: [id number] rity Quantity: 5 .pdf Page 21 of 21 Physician Procedures : CPT4 Code Description Modifier [id number] 99214 - WC PHYS LEVEL 4 - EST PT ICD-10 Diagnosis Description L97.818 Non-pressure chronic ulcer of other part of right lower leg with other specified severity L97.828 Non-pressure chronic ulcer of other part of  left lower leg with other specified severity D57.1 Sickle-cell disease without  crisis Quantity: 1 : [id number] 11042 - WC PHYS SUBQ TISS 20 SQ CM ICD-10 Diagnosis Description L97.818 Non-pressure chronic ulcer of other part of right lower leg with other specified severity Quantity: 1 : [id number] 11045 - WC PHYS SUBQ TISS EA ADDL 20 CM ICD-10 Diagnosis Description L97.818 Non-pressure chronic ulcer of other part of right lower leg with other specified severity Quantity: 5 Electronic Signature(s) Signed: 04/01/2023 10:27:40 AM By: Duanne Guess MD FACS Entered By: Duanne Guess on 04/01/2023 07:27:39

## 2023-04-01 NOTE — Telephone Encounter (Signed)
Please advise KH 

## 2023-04-01 NOTE — Progress Notes (Signed)
Established Patient Office Visit  Subjective   Patient ID: [name], female    DOB: [birth year]  Age: 52 y.o. MRN: [id number]  Chief Complaint  Patient presents with   Sickle Cell Anemia    Follow up    [name] is a 52 year old female with a medical history significant for sickle cell disease, chronic pain syndrome, opiate dependence and tolerance, bilateral lower extremity ulcers, and history of anemia of chronic disease presents for follow-up of chronic conditions. Patient continues to have worsening bilateral lower extremity ulcers.  She is followed weekly by wound care for dressing changes.  Patient reports that she was evaluated by wound care prior to arrival.  She reports worsening pain to lower extremities.  At the moment, her pain intensity is 8/10.  She states that pain is typically increased following dressing change. Patient has a history of chronic pain primarily to her lower extremities.  Pain has been managed with ibuprofen and oxycodone.  Patient is requesting refills today. She denies any fatigue, chest pain, or shortness of breath.  No urinary symptoms, nausea, vomiting, or diarrhea.    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   Sickle-cell disease with pain (HCC) 09/03/2018   Other chronic pain    Sickle cell crisis (HCC) 06/26/2018   Sickle cell pain crisis (HCC) 04/07/2018   Avascular necrosis of bone of hip, left (HCC) 12/08/2017   Avascular necrosis of bone of hip, right (HCC) 12/08/2017   Hb-SS disease without crisis  (HCC) 11/26/2017   Anemia of chronic disease 11/26/2017   Abnormal quad screen    Ringworm of body 04/05/2017   Ulcer of right lower extremity (HCC)    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HCC) 03/02/2017   History of pre-eclampsia 12/16/2016   Previous cesarean delivery x 2 07/22/2013   Hemochromatosis 11/10/2012   Chronic pain syndrome 10/13/2012   Complicated wound infection 04/01/2012   Leukocytosis 03/30/2012   Past Medical History:  Diagnosis Date   Anemia 03/30/2012   Hx of sickle cell disease   CAP (community acquired pneumonia)    2014   Cholelithiasis 03/30/2012   Chronic wound of extremity 04/01/2012   Elevated LFTs    Leg ulcer (HCC) 10/27/2012   Chronic under care of wound clinic   Multiple open wounds of lower extremity    chronic wounds B/LLE   Sickle cell disease (HCC)    Sinus bradycardia by electrocardiogram 04/03/2012   Past Surgical History:  Procedure Laterality Date   ALLOGRAFT APPLICATION Right 02/14/2015   Procedure: SURGICAL PREP FOR GRAFTING RIGHT LOWER EXTREMITY AND APPLICATION OF Marcellus Scott;  Surgeon: Glenna Fellows, MD;  Location: [facility];  Service: Plastics;  Laterality: Right;   APPLICATION OF A-CELL OF EXTREMITY Right 10/09/2015   Procedure: APPLICATION OF Marcellus Scott;  Surgeon: Glenna Fellows, MD;  Location: [facility];  Service: Plastics;  Laterality: Right;   BREAST SURGERY     left breast cyst aspiration   CESAREAN SECTION     CESAREAN SECTION N/A 01/06/2014   Procedure: CESAREAN SECTION;  Surgeon: Allie Bossier, MD;  Location: [facility];  Service: Obstetrics;  Laterality: N/A;   CESAREAN SECTION N/A 05/04/2017   Procedure: CESAREAN SECTION;  Surgeon: Adam Phenix, MD;  Location: [facility];  Service: Obstetrics;  Laterality: N/A;   I & D EXTREMITY Right 10/09/2015   Procedure: SURGICAL PREPARATION FOR GRAFTING RIGHT ANKLE AND APPLICATION THERASKIN;  Surgeon: Glenna Fellows, MD;  Location: [facility];  Service: Plastics;  Laterality: Right;   SKIN FULL THICKNESS GRAFT Bilateral 06/17/2016   Procedure: SURGICAL PREP FOR GRAFTING, BILATERAL LOWER EXTREMITIES AND APPLICATION OF Marcellus Scott;  Surgeon: Glenna Fellows, MD;  Location: [facility];  Service: Plastics;  Laterality: Bilateral;   TONSILLECTOMY     Social History   Tobacco Use   Smoking status: Never    Passive exposure: Never   Smokeless tobacco: Never  Vaping Use   Vaping status: Never Used  Substance Use Topics   Alcohol use: No   Drug use: No   Social History   Socioeconomic History   Marital status: Married    Spouse name: Arvilla Meres   Number of children: 1   Years of education: Not on file   Highest education level: Not on file  Occupational History   Occupation: Employed in home care.  Tobacco Use   Smoking status: Never    Passive exposure: Never   Smokeless tobacco: Never  Vaping Use   Vaping status: Never Used  Substance and Sexual Activity   Alcohol use: No   Drug use: No   Sexual activity: Yes    Birth control/protection: None  Other Topics Concern   Not on file  Social History Narrative   Lives with husband.   Social Determinants of Health   Financial Resource Strain: Not on file  Food Insecurity: Low Risk  (05/24/2022)   [facility]   Hunger Vital Sign    Worried About Running Out of Food in the Last Year: Never true    Within the past 12 months, the food you bought just didn't last and you didn't have money to get more: Not on file  Transportation Needs: Not on file (05/24/2022)  Physical Activity: Not on file  Stress: Not on file  Social Connections: Not on file  Intimate Partner Violence: Low Risk  (05/24/2022)   [facility] prior to 07/20/2022., [facility] visits prior to 07/20/2022.   Safety    How often does anyone, including family and friends, physically hurt you?: Never     How often does anyone, including family and friends, insult or talk down to you?: Never    How often does anyone, including family and friends, threaten you with harm?: Never    How often does anyone, including family and friends, scream or curse at you?: Never   Family Status  Relation Name Status   Sister  (Not Specified)   Mother  Alive   Father  Deceased  No partnership data on file      Review of Systems  Constitutional: Negative.   HENT: Negative.    Eyes: Negative.   Respiratory: Negative.    Cardiovascular: Negative.   Gastrointestinal: Negative.   Genitourinary: Negative.   Musculoskeletal:  Positive for back pain and joint pain.  Skin: Negative.   Psychiatric/Behavioral: Negative.        Objective:     BP 108/66   Pulse 69   Temp (!)  97.2 F (36.2 C)   Resp 12   Wt 132 lb (59.9 kg)   SpO2 91%   BMI 20.67 kg/m  BP Readings from Last 3 Encounters:  04/01/23 108/66  12/24/22 (!) 122/51  10/08/22 113/67   Wt Readings from Last 3 Encounters:  04/01/23 132 lb (59.9 kg)  12/24/22 133 lb 9.6 oz (60.6 kg)  09/24/22 131 lb 3.2 oz (59.5 kg)      Physical Exam   No results found for any visits on 04/01/23.  Last CBC Lab Results  Component Value Date   WBC 12.8 (H) 12/24/2022   HGB 7.5 (L) 12/24/2022   HCT 21.1 (L) 12/24/2022   MCV 88 12/24/2022   MCH 31.4 12/24/2022   RDW 22.0 (H) 12/24/2022   PLT 414 12/24/2022   Last metabolic panel Lab Results  Component Value Date   GLUCOSE 82 12/24/2022   NA 138 12/24/2022   K 4.5 12/24/2022   CL 105 12/24/2022   CO2 17 (L) 12/24/2022   BUN 10 12/24/2022   CREATININE 0.56 (L) 12/24/2022   EGFR 110 12/24/2022   CALCIUM 8.8 12/24/2022   PROT 7.7 12/24/2022   ALBUMIN 4.1 12/24/2022   LABGLOB 3.6 12/24/2022   AGRATIO 1.5 09/24/2022   BILITOT 2.3 (H) 12/24/2022   ALKPHOS 134 (H) 12/24/2022   AST 95 (H) 12/24/2022   ALT 70 (H) 12/24/2022   ANIONGAP 6 10/28/2022   Last lipids Lab Results   Component Value Date   CHOL 143 11/25/2014   HDL 27 (L) 11/25/2014   LDLCALC 89 11/25/2014   TRIG 137 11/25/2014   CHOLHDL 5.3 11/25/2014   Last hemoglobin A1c Lab Results  Component Value Date   HGBA1C 4.4 (L) 03/20/2017   Last thyroid functions Lab Results  Component Value Date   TSH 0.099 (L) 03/20/2017   Last vitamin D Lab Results  Component Value Date   VD25OH 30.9 12/24/2022      The ASCVD Risk score (Arnett DK, et al., 2019) failed to calculate for the following reasons:   Cannot find a previous HDL lab   Cannot find a previous total cholesterol lab    Assessment & Plan:   Problem List Items Addressed This Visit       Other   Chronic pain syndrome (Chronic)   Relevant Medications   Oxycodone HCl 10 MG TABS   Other Relevant Orders   [id number] 11+Oxyco+Alc+Crt-Bund   Urinalysis Dipstick (Completed)   Sickle-cell disease with pain (HCC) - Primary   Relevant Orders   [id number] 11+Oxyco+Alc+Crt-Bund   Urinalysis Dipstick (Completed)   Other Visit Diagnoses     Vitamin D deficiency       Relevant Orders   [id number] 11+Oxyco+Alc+Crt-Bund   Urinalysis Dipstick (Completed)      1. Sickle-cell disease with pain (HCC) Has discontinued Oxbryta.  Will continue folic acid 1 mg daily.  - 176160 11+Oxyco+Alc+Crt-Bund - Urinalysis Dipstick  2. Vitamin D deficiency  - 737106 11+Oxyco+Alc+Crt-Bund - Urinalysis Dipstick  3. Chronic pain syndrome - [id number] 11+Oxyco+Alc+Crt-Bund - Urinalysis Dipstick - Oxycodone HCl 10 MG TABS; Take 1 tablet (10 mg total) by mouth every 4 (four) hours while awake.  Dispense: 90 tablet; Refill: 0   Patient will continue to follow-up with wound care weekly.  Return in about 3 months (around 07/02/2023) for sickle cell anemia.   Nolon Nations  APRN, MSN, FNP-C Patient Care [facility] [address] [place], [place] [address] [phone]

## 2023-04-01 NOTE — Progress Notes (Signed)
[name], [name] ([id number]) [id number] Page 1 of 18 Visit Report for 04/01/2023 Arrival Information Details Patient Name: Date of Service: [name] [name] 04/01/2023 8:30 A M Medical Record Number: [id number] Patient Account Number: [id number] Date of Birth/Sex: Treating RN: Sep 26, 1970 (52 y.o. [name], [name] Primary [name] [name]: Julianne Handler Other Clinician: Referring [name]: Treating Drakkar Medeiros/Extender: Koleen Nimrod Weeks in Treatment: 102 Visit Information History Since Last Visit Added or deleted any medications: No Patient Arrived: Ambulatory Any new allergies or adverse reactions: No Arrival Time: 08:39 Had a fall or experienced change in No Accompanied By: self activities of daily living that may affect Transfer Assistance: None risk of falls: Patient Identification Verified: Yes Signs or symptoms of abuse/neglect since last visito No Secondary Verification Process Completed: Yes Hospitalized since last visit: No Patient Requires Transmission-Based Precautions: No Implantable device outside of the clinic excluding No Patient Has Alerts: No cellular tissue based products placed in the center since last visit: Has Dressing in Place as Prescribed: Yes Pain Present Now: Yes Electronic Signature(s) Signed: 04/01/2023 3:33:50 PM By: Samuella Bruin Entered By: Samuella Bruin on 04/01/2023 05:42:00 -------------------------------------------------------------------------------- Encounter Discharge Information Details Patient Name: Date of Service: [name], [name] [name] 04/01/2023 8:30 A M Medical Record Number: [id number] Patient Account Number: [id number] Date of Birth/Sex: Treating RN: 20-Feb-1971 (52 y.o. [name] [name]: Julianne Handler Other Clinician: Referring [name]: Treating [name]/Extender: Koleen Nimrod Weeks in Treatment: [phone] Encounter Discharge  Information Items Post Procedure Vitals Discharge Condition: Stable Temperature (F): 97.9 Ambulatory Status: Ambulatory Pulse (bpm): 104 Discharge Destination: Home Respiratory Rate (breaths/min): 16 Transportation: Private Auto Blood Pressure (mmHg): 128/64 Accompanied By: self Schedule Follow-up Appointment: Yes Clinical Summary of [name]: Patient Declined Electronic Signature(s) Signed: 04/01/2023 3:33:50 PM By: Samuella Bruin Entered By: Samuella Bruin on 04/01/2023 06:51:37 [name] ([id number]) [id number] Page 2 of 18 -------------------------------------------------------------------------------- Lower Extremity Assessment Details Patient Name: Date of Service: [name] [name] 04/01/2023 8:30 A M Medical Record Number: [id number] Patient Account Number: [id number] Date of Birth/Sex: Treating RN: 26-Feb-1971 (52 y.o. [name] [name]: Julianne Handler Other Clinician: Referring Keyonna Comunale: Treating Tyrese Ficek/Extender: Koleen Nimrod Weeks in Treatment: 102 Edema Assessment Assessed: [Left: No] [Right: No] Edema: [Left: N] [Right: o] Calf Left: Right: Point of Measurement: 33 cm From Medial Instep 29.5 cm 30.2 cm Ankle Left: Right: Point of Measurement: 10 cm From Medial Instep 18 cm 21.7 cm Vascular Assessment Pulses: Dorsalis Pedis Palpable: [Left:Yes] [Right:Yes] Extremity colors, hair growth, and conditions: Extremity Color: [Left:Normal] [Right:Normal] Hair Growth on Extremity: [Left:No] [Right:No] Temperature of Extremity: [Left:Warm] [Right:Warm] Capillary Refill: [Left:< 3 seconds] [Right:< 3 seconds] Dependent Rubor: [Left:No] [Right:No] Blanched when Elevated: [Left:No No] [Right:No No] Electronic Signature(s) Signed: 04/01/2023 3:33:50 PM By: Samuella Bruin Entered By: Samuella Bruin on 04/01/2023  05:50:05 -------------------------------------------------------------------------------- Multi Wound Chart Details Patient Name: Date of Service: [name], [name] [name] 04/01/2023 8:30 A M Medical Record Number: [id number] Patient Account Number: [id number] Date of Birth/Sex: Treating RN: Oct 04, 1970 (52 y.o. F) Primary [name] [name]: Julianne Handler Other Clinician: Referring [name]: Treating [name]/Extender: Koleen Nimrod Weeks in Treatment: 102 Vital Signs Height(in): 67 Pulse(bpm): 104 Weight(lbs): 134 Blood Pressure(mmHg): 128/64 Body Mass Index(BMI): 21 Temperature(F): 97.9 Respiratory Rate(breaths/min): 16 [17:Photos:] [id number] Page 3 of 18] Right, Lateral Lower Leg Right, Medial Ankle Left, Lateral Lower Leg Wound Location: Gradually Appeared Gradually Appeared Gradually Appeared Wounding Event: Sickle Cell Lesion Sickle Cell Lesion Sickle Cell Lesion Primary Etiology: N/A  Venous Leg Ulcer N/A Secondary Etiology: Anemia, Sickle Cell Disease, Anemia, Sickle Cell Disease, Anemia, Sickle Cell Disease, Comorbid History: Neuropathy Neuropathy Neuropathy 10/05/2012 06/26/2021 03/18/2023 Date Acquired: 102 92 0 Weeks of Treatment: Open Open Open Wound Status: No No No Wound Recurrence: Yes No No Clustered Wound: 10x7.7x0.2 12.5x5.6x0.2 2.5x1.8x0.1 Measurements L x W x D (cm) 60.476 54.978 3.534 A (cm) : rea 12.095 10.996 0.353 Volume (cm) : 67.80% -89.70% N/A % Reduction in A rea: 35.60% -279.40% N/A % Reduction in Volume: Full Thickness Without Exposed Full Thickness Without Exposed Full Thickness Without Exposed Classification: Support Structures Support Structures Support Structures Large Large Medium Exudate A mount: Serosanguineous Serosanguineous Serous Exudate Type: red, brown red, brown amber Exudate Color: Distinct, outline attached Distinct, outline attached Distinct, outline attached Wound  Margin: Small (1-33%) Small (1-33%) Small (1-33%) Granulation A mount: Pink Pink Pink Granulation Quality: Large (67-100%) Large (67-100%) Large (67-100%) Necrotic A mount: Fat Layer (Subcutaneous Tissue): Yes Fat Layer (Subcutaneous Tissue): Yes Fat Layer (Subcutaneous Tissue): Yes Exposed Structures: Fascia: No Fascia: No Fascia: No Tendon: No Tendon: No Tendon: No Muscle: No Muscle: No Muscle: No Joint: No Joint: No Joint: No Bone: No Bone: No Bone: No None None None Epithelialization: Debridement - Excisional Debridement - Excisional N/A Debridement: Pre-procedure Verification/Time Out 09:10 09:10 N/A Taken: Lidocaine 4% Topical Solution Lidocaine 4% Topical Solution N/A Pain Control: Subcutaneous, Slough Subcutaneous, Slough N/A Tissue Debrided: Skin/Subcutaneous Tissue Skin/Subcutaneous Tissue N/A Level: 60.44 54.95 N/A Debridement A (sq cm): rea Curette Curette N/A Instrument: Minimum Minimum N/A Bleeding: Pressure Pressure N/A Hemostasis A chieved: Procedure was tolerated well Procedure was tolerated well N/A Debridement Treatment Response: 10x7.7x0.2 12.5x5.6x0.2 N/A Post Debridement Measurements L x W x D (cm) 12.095 10.996 N/A Post Debridement Volume: (cm) Scarring: Yes Scarring: Yes Scarring: Yes Periwound Skin Texture: No Abnormalities Noted No Abnormalities Noted No Abnormalities Noted Periwound Skin Moisture: Hemosiderin Staining: Yes No Abnormalities Noted No Abnormalities Noted Periwound Skin Color: Ecchymosis: No No Abnormality No Abnormality No Abnormality Temperature: Yes Yes Yes Tenderness on Palpation: Debridement Debridement N/A Procedures Performed: Wound Number: [id number] N/A Photos: N/A Left, Medial Lower Leg Left Achilles N/A Wound Location: Gradually Appeared Gradually Appeared N/A Wounding Event: Sickle Cell Lesion Sickle Cell Lesion N/A Primary Etiology: N/A N/A N/A Secondary Etiology: Anemia, Sickle Cell Disease,  Anemia, Sickle Cell Disease, N/A Comorbid History: Neuropathy Neuropathy 03/18/2023 03/18/2023 N/A Date Acquired: 0 0 N/A Weeks of Treatment: Open Open N/A Wound Status: No No N/A Wound Recurrence: No No N/A Clustered Wound: [name] ([id number]) [id number] Page 4 of 18 1.1x1.2x0.1 1.6x1.3x0.1 N/A Measurements L x W x D (cm) 1.037 1.634 N/A A (cm) : rea 0.104 0.163 N/A Volume (cm) : N/A N/A N/A % Reduction in Area: N/A N/A N/A % Reduction in Volume: Full Thickness Without Exposed Full Thickness Without Exposed N/A Classification: Support Structures Support Structures Medium Medium N/A Exudate A mount: Serous Serous N/A Exudate Type: Media planner N/A Exudate Color: Distinct, outline attached Distinct, outline attached N/A Wound Margin: Small (1-33%) Small (1-33%) N/A Granulation A mount: Pink Pink N/A Granulation Quality: Large (67-100%) Large (67-100%) N/A Necrotic A mount: Fat Layer (Subcutaneous Tissue): Yes Fat Layer (Subcutaneous Tissue): Yes N/A Exposed Structures: Fascia: No Fascia: No Tendon: No Tendon: No Muscle: No Muscle: No Joint: No Joint: No Bone: No Bone: No None None N/A Epithelialization: N/A N/A N/A Debridement: N/A N/A N/A Pain Control: N/A N/A N/A Tissue Debrided: N/A N/A N/A Level: N/A N/A N/A Debridement A (sq cm): rea  N/A N/A N/A Instrument: N/A N/A N/A Bleeding: N/A N/A N/A Hemostasis A chieved: Debridement Treatment Response: N/A N/A N/A Post Debridement Measurements L x N/A N/A N/A W x D (cm) N/A N/A N/A Post Debridement Volume: (cm) Scarring: Yes Scarring: Yes N/A Periwound Skin Texture: No Abnormalities Noted No Abnormalities Noted N/A Periwound Skin Moisture: No Abnormalities Noted No Abnormalities Noted N/A Periwound Skin Color: No Abnormality No Abnormality N/A Temperature: Yes Yes N/A Tenderness on Palpation: N/A N/A N/A Procedures Performed: Treatment Notes Wound  #17 (Lower Leg) Wound Laterality: Right, Lateral Cleanser Soap and Water Discharge Instruction: May shower and wash wound with dial antibacterial soap and water prior to dressing change. Wound Cleanser Discharge Instruction: Cleanse the wound with wound cleanser prior to applying a clean dressing using gauze sponges, not tissue or cotton balls. Peri-Wound [name] Triamcinolone 15 (g) Discharge Instruction: Use triamcinolone 15 (g) as directed Sween Lotion (Moisturizing lotion) Discharge Instruction: Apply moisturizing lotion as directed Topical Gentamicin Discharge Instruction: As directed by physician Mupirocin Ointment Discharge Instruction: Apply Mupirocin (Bactroban) as instructed Primary Dressing Santyl Ointment Discharge Instruction: Apply nickel thick amount to wound bed as instructed Secondary Dressing ABD Pad, 5x9 Discharge Instruction: Apply over primary dressing as directed. Woven Gauze Sponge, Non-Sterile 4x4 in Discharge Instruction: Moisten with normal saline and Apply over primary dressing as directed. Secured With Elastic Bandage 4 inch (ACE bandage) Discharge Instruction: Secure with ACE bandage as directed. Kerlix Roll Sterile, 4.5x3.1 (in/yd) Discharge Instruction: Secure with Kerlix as directed. ALTHA, SHAUB ([id number]) [id number] Page 5 of 18 Compression Wrap Compression Stockings Add-Ons Wound #21 (Ankle) Wound Laterality: Right, Medial Cleanser Soap and Water Discharge Instruction: May shower and wash wound with dial antibacterial soap and water prior to dressing change. Wound Cleanser Discharge Instruction: Cleanse the wound with wound cleanser prior to applying a clean dressing using gauze sponges, not tissue or cotton balls. Peri-Wound [name] Triamcinolone 15 (g) Discharge Instruction: Use triamcinolone 15 (g) as directed Sween Lotion (Moisturizing lotion) Discharge Instruction: Apply moisturizing lotion as  directed Topical Gentamicin Discharge Instruction: As directed by physician Mupirocin Ointment Discharge Instruction: Apply Mupirocin (Bactroban) as instructed Primary Dressing Santyl Ointment Discharge Instruction: Apply nickel thick amount to wound bed as instructed Secondary Dressing ABD Pad, 5x9 Discharge Instruction: Apply over primary dressing as directed. Woven Gauze Sponge, Non-Sterile 4x4 in Discharge Instruction: Moisten with normal saline and Apply over primary dressing as directed. Secured With Elastic Bandage 4 inch (ACE bandage) Discharge Instruction: Secure with ACE bandage as directed. Kerlix Roll Sterile, 4.5x3.1 (in/yd) Discharge Instruction: Secure with Kerlix as directed. Compression Wrap Compression Stockings Add-Ons Wound #26 (Lower Leg) Wound Laterality: Left, Lateral Cleanser Soap and Water Discharge Instruction: May shower and wash wound with dial antibacterial soap and water prior to dressing change. Wound Cleanser Discharge Instruction: Cleanse the wound with wound cleanser prior to applying a clean dressing using gauze sponges, not tissue or cotton balls. Peri-Wound [name] Triamcinolone 15 (g) Discharge Instruction: Use triamcinolone 15 (g) as directed Sween Lotion (Moisturizing lotion) Discharge Instruction: Apply moisturizing lotion as directed Topical Gentamicin Discharge Instruction: As directed by physician Mupirocin Ointment Discharge Instruction: Apply Mupirocin (Bactroban) as instructed Primary Dressing Maxorb Extra Ag+ Alginate Dressing, 4x4.75 (in/in) Discharge Instruction: Apply to wound bed as instructed [name] ([id number]) [id number] Page 6 of 18 Santyl Ointment Discharge Instruction: Apply nickel thick amount to wound bed as instructed Secondary Dressing ABD Pad, 5x9 Discharge Instruction: Apply over primary dressing as directed. Woven Gauze Sponge, Non-Sterile 4x4 in Discharge Instruction:  Moisten with normal  saline and Apply over primary dressing as directed. Secured With Elastic Bandage 4 inch (ACE bandage) Discharge Instruction: Secure with ACE bandage as directed. Kerlix Roll Sterile, 4.5x3.1 (in/yd) Discharge Instruction: Secure with Kerlix as directed. Compression Wrap Compression Stockings Add-Ons Wound #27 (Lower Leg) Wound Laterality: Left, Medial Cleanser Soap and Water Discharge Instruction: May shower and wash wound with dial antibacterial soap and water prior to dressing change. Wound Cleanser Discharge Instruction: Cleanse the wound with wound cleanser prior to applying a clean dressing using gauze sponges, not tissue or cotton balls. Peri-Wound [name] Triamcinolone 15 (g) Discharge Instruction: Use triamcinolone 15 (g) as directed Sween Lotion (Moisturizing lotion) Discharge Instruction: Apply moisturizing lotion as directed Topical Gentamicin Discharge Instruction: As directed by physician Mupirocin Ointment Discharge Instruction: Apply Mupirocin (Bactroban) as instructed Primary Dressing Maxorb Extra Ag+ Alginate Dressing, 4x4.75 (in/in) Discharge Instruction: Apply to wound bed as instructed Santyl Ointment Discharge Instruction: Apply nickel thick amount to wound bed as instructed Secondary Dressing ABD Pad, 5x9 Discharge Instruction: Apply over primary dressing as directed. Woven Gauze Sponge, Non-Sterile 4x4 in Discharge Instruction: Moisten with normal saline and Apply over primary dressing as directed. Secured With Elastic Bandage 4 inch (ACE bandage) Discharge Instruction: Secure with ACE bandage as directed. Kerlix Roll Sterile, 4.5x3.1 (in/yd) Discharge Instruction: Secure with Kerlix as directed. Compression Wrap Compression Stockings Add-Ons Wound #28 (Achilles) Wound Laterality: Left Cleanser Soap and Water Discharge Instruction: May shower and wash wound with dial antibacterial soap and water prior to dressing change. Wound  Cleanser [name], [name] ([id number]) [id number] Page 7 of 18 Discharge Instruction: Cleanse the wound with wound cleanser prior to applying a clean dressing using gauze sponges, not tissue or cotton balls. Peri-Wound [name] Triamcinolone 15 (g) Discharge Instruction: Use triamcinolone 15 (g) as directed Sween Lotion (Moisturizing lotion) Discharge Instruction: Apply moisturizing lotion as directed Topical Gentamicin Discharge Instruction: As directed by physician Mupirocin Ointment Discharge Instruction: Apply Mupirocin (Bactroban) as instructed Primary Dressing Maxorb Extra Ag+ Alginate Dressing, 4x4.75 (in/in) Discharge Instruction: Apply to wound bed as instructed Santyl Ointment Discharge Instruction: Apply nickel thick amount to wound bed as instructed Secondary Dressing ABD Pad, 5x9 Discharge Instruction: Apply over primary dressing as directed. Woven Gauze Sponge, Non-Sterile 4x4 in Discharge Instruction: Moisten with normal saline and Apply over primary dressing as directed. Secured With Elastic Bandage 4 inch (ACE bandage) Discharge Instruction: Secure with ACE bandage as directed. Kerlix Roll Sterile, 4.5x3.1 (in/yd) Discharge Instruction: Secure with Kerlix as directed. Compression Wrap Compression Stockings Add-Ons Electronic Signature(s) Signed: 04/01/2023 10:13:23 AM By: Duanne Guess MD FACS Entered By: Duanne Guess on 04/01/2023 07:13:23 -------------------------------------------------------------------------------- Multi-Disciplinary [name] Plan Details Patient Name: Date of Service: [name], [name] [name] 04/01/2023 8:30 A M Medical Record Number: [id number] Patient Account Number: [id number] Date of Birth/Sex: Treating RN: 1970-11-18 (52 y.o. [name] Ellionna Buckbee: Julianne Handler Other Clinician: Referring Travonna Swindle: Treating Shyvonne Chastang/Extender: Koleen Nimrod Weeks in Treatment:  102 Multidisciplinary [name] Plan reviewed with physician Active Inactive Necrotic Tissue Nursing Diagnoses: Impaired tissue integrity related to necrotic/devitalized tissue Knowledge deficit related to management of necrotic/devitalized tissue Goals: [name] ([id number]) [id number] Page 8 of 18 Necrotic/devitalized tissue will be minimized in the wound bed Date Initiated: 12/04/2022 Target Resolution Date: 04/18/2023 Goal Status: Active Patient/caregiver will verbalize understanding of reason and process for debridement of necrotic tissue Date Initiated: 12/04/2022 Target Resolution Date: 04/18/2023 Goal Status: Active Interventions: Assess patient pain level pre-, during and post procedure and prior to discharge Provide education on necrotic tissue and  debridement process Treatment Activities: Apply topical anesthetic as ordered : 12/04/2022 Enzymatic debridement : 12/04/2022 Notes: Electronic Signature(s) Signed: 04/01/2023 3:33:50 PM By: Samuella Bruin Entered By: Samuella Bruin on 04/01/2023 06:02:46 -------------------------------------------------------------------------------- Pain Assessment Details Patient Name: Date of Service: [name], [name] [name] 04/01/2023 8:30 A M Medical Record Number: [id number] Patient Account Number: [id number] Date of Birth/Sex: Treating RN: 02-09-1971 (52 y.o. [name] Dalasia Predmore: Julianne Handler Other Clinician: Referring Lyfe Monger: Treating Zaneta Lightcap/Extender: Koleen Nimrod Weeks in Treatment: [phone] Active Problems Location of Pain Severity and Description of Pain Patient Has Paino Yes Site Locations Pain Location: Pain in Ulcers Duration of the Pain. Constant / Intermittento Constant Rate the pain. Current Pain Level: 7 Character of Pain Describe the Pain: Burning Pain Management and Medication Current Pain Management: Medication: Yes Electronic  Signature(s) Signed: 04/01/2023 3:33:50 PM By: Samuella Bruin Entered By: Samuella Bruin on 04/01/2023 05:42:20 [name] ([id number]) [id number] Page 9 of 18 -------------------------------------------------------------------------------- Patient/Caregiver Education Details Patient Name: Date of Service: [name], [place] [name] 11/12/2024andnbsp8:30 A M Medical Record Number: [id number] Patient Account Number: [id number] Date of Birth/Gender: Treating RN: 10-Jan-1971 (52 y.o. [name] Physician: Julianne Handler Other Clinician: Referring Physician: Treating Physician/Extender: Koleen Nimrod Weeks in Treatment: 102 Education Assessment Education Provided To: Patient Education Topics Provided Wound/Skin Impairment: Methods: Explain/Verbal Responses: Reinforcements needed, State content correctly Electronic Signature(s) Signed: 04/01/2023 3:33:50 PM By: Samuella Bruin Entered By: Samuella Bruin on 04/01/2023 06:03:08 -------------------------------------------------------------------------------- Wound Assessment Details Patient Name: Date of Service: [name], [name] [name] 04/01/2023 8:30 A M Medical Record Number: [id number] Patient Account Number: [id number] Date of Birth/Sex: Treating RN: [birth year] (52 y.o. [name] Raelynne Ludwick: Julianne Handler Other Clinician: Referring Nicha Hemann: Treating Malon Branton/Extender: Koleen Nimrod Weeks in Treatment: 102 Wound Status Wound Number: 17 Primary Etiology: Sickle Cell Lesion Wound Location: Right, Lateral Lower Leg Wound Status: Open Wounding Event: Gradually Appeared Comorbid History: Anemia, Sickle Cell Disease, Neuropathy Date Acquired: 10/05/2012 Weeks Of Treatment: 102 Clustered Wound: Yes Photos Wound Measurements Length: (cm) 10 Width: (cm) 7.7 Miramontes, Kassidie ([id number]) Depth: (cm) 0.2 Area: (cm)  60.476 Volume: (cm) 12.095 % Reduction in Area: 67.8% % Reduction in Volume: 35.6% [id number] Page 10 of 18 Epithelialization: None Tunneling: No Undermining: No Wound Description Classification: Full Thickness Without Exposed Support Structures Wound Margin: Distinct, outline attached Exudate Amount: Large Exudate Type: Serosanguineous Exudate Color: red, brown Foul Odor After Cleansing: No Slough/Fibrino Yes Wound Bed Granulation Amount: Small (1-33%) Exposed Structure Granulation Quality: Pink Fascia Exposed: No Necrotic Amount: Large (67-100%) Fat Layer (Subcutaneous Tissue) Exposed: Yes Necrotic Quality: Adherent Slough Tendon Exposed: No Muscle Exposed: No Joint Exposed: No Bone Exposed: No Periwound Skin Texture Texture Color No Abnormalities Noted: No No Abnormalities Noted: No Scarring: Yes Ecchymosis: No Hemosiderin Staining: Yes Moisture No Abnormalities Noted: Yes Temperature / Pain Temperature: No Abnormality Tenderness on Palpation: Yes Treatment Notes Wound #17 (Lower Leg) Wound Laterality: Right, Lateral Cleanser Soap and Water Discharge Instruction: May shower and wash wound with dial antibacterial soap and water prior to dressing change. Wound Cleanser Discharge Instruction: Cleanse the wound with wound cleanser prior to applying a clean dressing using gauze sponges, not tissue or cotton balls. Peri-Wound [name] Triamcinolone 15 (g) Discharge Instruction: Use triamcinolone 15 (g) as directed Sween Lotion (Moisturizing lotion) Discharge Instruction: Apply moisturizing lotion as directed Topical Gentamicin Discharge Instruction: As directed by physician Mupirocin Ointment Discharge Instruction: Apply Mupirocin (Bactroban) as instructed Primary Dressing Santyl Ointment  Discharge Instruction: Apply nickel thick amount to wound bed as instructed Secondary Dressing ABD Pad, 5x9 Discharge Instruction: Apply over primary  dressing as directed. Woven Gauze Sponge, Non-Sterile 4x4 in Discharge Instruction: Moisten with normal saline and Apply over primary dressing as directed. Secured With Elastic Bandage 4 inch (ACE bandage) Discharge Instruction: Secure with ACE bandage as directed. Kerlix Roll Sterile, 4.5x3.1 (in/yd) Discharge Instruction: Secure with Kerlix as directed. Compression Wrap Compression Stockings Add-Ons [name], [name] ([id number]) [id number] Page 11 of 18 Electronic Signature(s) Signed: 04/01/2023 3:33:50 PM By: Gelene Mink By: Samuella Bruin on 04/01/2023 05:56:54 -------------------------------------------------------------------------------- Wound Assessment Details Patient Name: Date of Service: [name], [place] [name] 04/01/2023 8:30 A M Medical Record Number: [id number] Patient Account Number: [id number] Date of Birth/Sex: Treating RN: [birth year] (52 y.o. [name] Chennel Olivos: Julianne Handler Other Clinician: Referring Nachmen Mansel: Treating Merlina Marchena/Extender: Koleen Nimrod Weeks in Treatment: 102 Wound Status Wound Number: 21 Primary Etiology: Sickle Cell Lesion Wound Location: Right, Medial Ankle Secondary Etiology: Venous Leg Ulcer Wounding Event: Gradually Appeared Wound Status: Open Date Acquired: 06/26/2021 Comorbid History: Anemia, Sickle Cell Disease, Neuropathy Weeks Of Treatment: 92 Clustered Wound: No Photos Wound Measurements Length: (cm) 12.5 Width: (cm) 5.6 Depth: (cm) 0.2 Area: (cm) 54.978 Volume: (cm) 10.996 % Reduction in Area: -89.7% % Reduction in Volume: -279.4% Epithelialization: None Tunneling: No Undermining: No Wound Description Classification: Full Thickness Without Exposed Support Structures Wound Margin: Distinct, outline attached Exudate Amount: Large Exudate Type: Serosanguineous Exudate Color: red, brown Foul Odor After Cleansing: No Slough/Fibrino  Yes Wound Bed Granulation Amount: Small (1-33%) Exposed Structure Granulation Quality: Pink Fascia Exposed: No Necrotic Amount: Large (67-100%) Fat Layer (Subcutaneous Tissue) Exposed: Yes Necrotic Quality: Adherent Slough Tendon Exposed: No Muscle Exposed: No Joint Exposed: No Bone Exposed: No Periwound Skin Texture Texture Color No Abnormalities Noted: No No Abnormalities Noted: Yes Scarring: Yes Temperature / Pain Temperature: No Abnormality Moisture No Abnormalities Noted: Yes Tenderness on Palpation: [name], [name] ([id number]) [id number] Page 12 of 18 Treatment Notes Wound #21 (Ankle) Wound Laterality: Right, Medial Cleanser Soap and Water Discharge Instruction: May shower and wash wound with dial antibacterial soap and water prior to dressing change. Wound Cleanser Discharge Instruction: Cleanse the wound with wound cleanser prior to applying a clean dressing using gauze sponges, not tissue or cotton balls. Peri-Wound [name] Triamcinolone 15 (g) Discharge Instruction: Use triamcinolone 15 (g) as directed Sween Lotion (Moisturizing lotion) Discharge Instruction: Apply moisturizing lotion as directed Topical Gentamicin Discharge Instruction: As directed by physician Mupirocin Ointment Discharge Instruction: Apply Mupirocin (Bactroban) as instructed Primary Dressing Santyl Ointment Discharge Instruction: Apply nickel thick amount to wound bed as instructed Secondary Dressing ABD Pad, 5x9 Discharge Instruction: Apply over primary dressing as directed. Woven Gauze Sponge, Non-Sterile 4x4 in Discharge Instruction: Moisten with normal saline and Apply over primary dressing as directed. Secured With Elastic Bandage 4 inch (ACE bandage) Discharge Instruction: Secure with ACE bandage as directed. Kerlix Roll Sterile, 4.5x3.1 (in/yd) Discharge Instruction: Secure with Kerlix as directed. Compression Wrap Compression  Stockings Add-Ons Electronic Signature(s) Signed: 04/01/2023 3:33:50 PM By: Samuella Bruin Entered By: Samuella Bruin on 04/01/2023 05:57:19 -------------------------------------------------------------------------------- Wound Assessment Details Patient Name: Date of Service: [name], [name] [name] 04/01/2023 8:30 A M Medical Record Number: [id number] Patient Account Number: [id number] Date of Birth/Sex: Treating RN: 12-06-1970 (52 y.o. [name] Keyia Moretto: Julianne Handler Other Clinician: Referring Shantea Poulton: Treating Philicia Heyne/Extender: Koleen Nimrod Weeks in Treatment: 102 Wound Status Wound Number: 26 Primary Etiology:  Sickle Cell Lesion Wound Location: Left, Lateral Lower Leg Wound Status: Open Wounding Event: Gradually Appeared Comorbid History: Anemia, Sickle Cell Disease, Neuropathy Date Acquired: 03/18/2023 Weeks Of Treatment: 0 Clustered Wound: No [name] ([id number]) [id number] Page 13 of 18 Photos Wound Measurements Length: (cm) 2.5 Width: (cm) 1.8 Depth: (cm) 0.1 Area: (cm) 3.534 Volume: (cm) 0.353 % Reduction in Area: % Reduction in Volume: Epithelialization: None Tunneling: No Undermining: No Wound Description Classification: Full Thickness Without Exposed Support Structures Wound Margin: Distinct, outline attached Exudate Amount: Medium Exudate Type: Serous Exudate Color: amber Foul Odor After Cleansing: No Slough/Fibrino Yes Wound Bed Granulation Amount: Small (1-33%) Exposed Structure Granulation Quality: Pink Fascia Exposed: No Necrotic Amount: Large (67-100%) Fat Layer (Subcutaneous Tissue) Exposed: Yes Necrotic Quality: Adherent Slough Tendon Exposed: No Muscle Exposed: No Joint Exposed: No Bone Exposed: No Periwound Skin Texture Texture Color No Abnormalities Noted: No No Abnormalities Noted: Yes Scarring: Yes Temperature / Pain Temperature: No  Abnormality Moisture No Abnormalities Noted: Yes Tenderness on Palpation: Yes Treatment Notes Wound #26 (Lower Leg) Wound Laterality: Left, Lateral Cleanser Soap and Water Discharge Instruction: May shower and wash wound with dial antibacterial soap and water prior to dressing change. Wound Cleanser Discharge Instruction: Cleanse the wound with wound cleanser prior to applying a clean dressing using gauze sponges, not tissue or cotton balls. Peri-Wound [name] Triamcinolone 15 (g) Discharge Instruction: Use triamcinolone 15 (g) as directed Sween Lotion (Moisturizing lotion) Discharge Instruction: Apply moisturizing lotion as directed Topical Gentamicin Discharge Instruction: As directed by physician Mupirocin Ointment Discharge Instruction: Apply Mupirocin (Bactroban) as instructed Primary Dressing Maxorb Extra Ag+ Alginate Dressing, 4x4.75 (in/in) Discharge Instruction: Apply to wound bed as instructed Santyl Ointment Discharge Instruction: Apply nickel thick amount to wound bed as instructed [name] ([id number]) [id number] Page 14 of 18 Secondary Dressing ABD Pad, 5x9 Discharge Instruction: Apply over primary dressing as directed. Woven Gauze Sponge, Non-Sterile 4x4 in Discharge Instruction: Moisten with normal saline and Apply over primary dressing as directed. Secured With Elastic Bandage 4 inch (ACE bandage) Discharge Instruction: Secure with ACE bandage as directed. Kerlix Roll Sterile, 4.5x3.1 (in/yd) Discharge Instruction: Secure with Kerlix as directed. Compression Wrap Compression Stockings Add-Ons Electronic Signature(s) Signed: 04/01/2023 3:33:50 PM By: Samuella Bruin Entered By: Samuella Bruin on 04/01/2023 05:57:42 -------------------------------------------------------------------------------- Wound Assessment Details Patient Name: Date of Service: [name], [name] [name] 04/01/2023 8:30 A M Medical Record Number:  [id number] Patient Account Number: [id number] Date of Birth/Sex: Treating RN: [birth year] (52 y.o. [name] Sharone Picchi: Julianne Handler Other Clinician: Referring Niyanna Asch: Treating Capers Hagmann/Extender: Koleen Nimrod Weeks in Treatment: 102 Wound Status Wound Number: 27 Primary Etiology: Sickle Cell Lesion Wound Location: Left, Medial Lower Leg Wound Status: Open Wounding Event: Gradually Appeared Comorbid History: Anemia, Sickle Cell Disease, Neuropathy Date Acquired: 03/18/2023 Weeks Of Treatment: 0 Clustered Wound: No Photos Wound Measurements Length: (cm) 1.1 Width: (cm) 1.2 Depth: (cm) 0.1 Area: (cm) 1.037 Volume: (cm) 0.104 % Reduction in Area: % Reduction in Volume: Epithelialization: None Tunneling: No Undermining: No Wound Description Classification: Full Thickness Without Exposed Support Structures Wound Margin: Distinct, outline attached Exudate Amount: Medium [place], [name] ([id number]) Exudate Type: Serous Exudate Color: amber Foul Odor After Cleansing: No Slough/Fibrino Yes [id number] Page 15 of 18 Wound Bed Granulation Amount: Small (1-33%) Exposed Structure Granulation Quality: Pink Fascia Exposed: No Necrotic Amount: Large (67-100%) Fat Layer (Subcutaneous Tissue) Exposed: Yes Necrotic Quality: Adherent Slough Tendon Exposed: No Muscle Exposed: No Joint Exposed: No Bone Exposed: No Periwound Skin Texture Texture  Color No Abnormalities Noted: No No Abnormalities Noted: Yes Scarring: Yes Temperature / Pain Temperature: No Abnormality Moisture No Abnormalities Noted: Yes Tenderness on Palpation: Yes Treatment Notes Wound #27 (Lower Leg) Wound Laterality: Left, Medial Cleanser Soap and Water Discharge Instruction: May shower and wash wound with dial antibacterial soap and water prior to dressing change. Wound Cleanser Discharge Instruction: Cleanse the wound with wound  cleanser prior to applying a clean dressing using gauze sponges, not tissue or cotton balls. Peri-Wound [name] Triamcinolone 15 (g) Discharge Instruction: Use triamcinolone 15 (g) as directed Sween Lotion (Moisturizing lotion) Discharge Instruction: Apply moisturizing lotion as directed Topical Gentamicin Discharge Instruction: As directed by physician Mupirocin Ointment Discharge Instruction: Apply Mupirocin (Bactroban) as instructed Primary Dressing Maxorb Extra Ag+ Alginate Dressing, 4x4.75 (in/in) Discharge Instruction: Apply to wound bed as instructed Santyl Ointment Discharge Instruction: Apply nickel thick amount to wound bed as instructed Secondary Dressing ABD Pad, 5x9 Discharge Instruction: Apply over primary dressing as directed. Woven Gauze Sponge, Non-Sterile 4x4 in Discharge Instruction: Moisten with normal saline and Apply over primary dressing as directed. Secured With Elastic Bandage 4 inch (ACE bandage) Discharge Instruction: Secure with ACE bandage as directed. Kerlix Roll Sterile, 4.5x3.1 (in/yd) Discharge Instruction: Secure with Kerlix as directed. Compression Wrap Compression Stockings Add-Ons Electronic Signature(s) Signed: 04/01/2023 3:33:50 PM By: Samuella Bruin Entered By: Samuella Bruin on 04/01/2023 05:58:57 [name] ([id number]) [id number] Page 16 of 18 -------------------------------------------------------------------------------- Wound Assessment Details Patient Name: Date of Service: [name] [name] 04/01/2023 8:30 A M Medical Record Number: [id number] Patient Account Number: [id number] Date of Birth/Sex: Treating RN: [birth year] (52 y.o. [name] Tyaisha Cullom: Julianne Handler Other Clinician: Referring Hollis Oh: Treating Myrah Strawderman/Extender: Koleen Nimrod Weeks in Treatment: 102 Wound Status Wound Number: 28 Primary Etiology: Sickle Cell Lesion Wound  Location: Left Achilles Wound Status: Open Wounding Event: Gradually Appeared Comorbid History: Anemia, Sickle Cell Disease, Neuropathy Date Acquired: 03/18/2023 Weeks Of Treatment: 0 Clustered Wound: No Photos Wound Measurements Length: (cm) 1.6 Width: (cm) 1.3 Depth: (cm) 0.1 Area: (cm) 1.634 Volume: (cm) 0.163 % Reduction in Area: % Reduction in Volume: Epithelialization: None Tunneling: No Undermining: No Wound Description Classification: Full Thickness Without Exposed Support Wound Margin: Distinct, outline attached Exudate Amount: Medium Exudate Type: Serous Exudate Color: amber Structures Foul Odor After Cleansing: No Slough/Fibrino Yes Wound Bed Granulation Amount: Small (1-33%) Exposed Structure Granulation Quality: Pink Fascia Exposed: No Necrotic Amount: Large (67-100%) Fat Layer (Subcutaneous Tissue) Exposed: Yes Necrotic Quality: Adherent Slough Tendon Exposed: No Muscle Exposed: No Joint Exposed: No Bone Exposed: No Periwound Skin Texture Texture Color No Abnormalities Noted: Yes No Abnormalities Noted: Yes Moisture Temperature / Pain No Abnormalities Noted: Yes Temperature: No Abnormality Tenderness on Palpation: Yes Treatment Notes Wound #28 (Achilles) Wound Laterality: Left Cleanser Soap and Water [place], [name] ([id number]) [id number] Page [phone] of 18 Discharge Instruction: May shower and wash wound with dial antibacterial soap and water prior to dressing change. Wound Cleanser Discharge Instruction: Cleanse the wound with wound cleanser prior to applying a clean dressing using gauze sponges, not tissue or cotton balls. Peri-Wound [name] Triamcinolone 15 (g) Discharge Instruction: Use triamcinolone 15 (g) as directed Sween Lotion (Moisturizing lotion) Discharge Instruction: Apply moisturizing lotion as directed Topical Gentamicin Discharge Instruction: As directed by physician Mupirocin Ointment Discharge  Instruction: Apply Mupirocin (Bactroban) as instructed Primary Dressing Maxorb Extra Ag+ Alginate Dressing, 4x4.75 (in/in) Discharge Instruction: Apply to wound bed as instructed Santyl Ointment Discharge Instruction: Apply nickel thick amount to wound bed as instructed  Secondary Dressing ABD Pad, 5x9 Discharge Instruction: Apply over primary dressing as directed. Woven Gauze Sponge, Non-Sterile 4x4 in Discharge Instruction: Moisten with normal saline and Apply over primary dressing as directed. Secured With Elastic Bandage 4 inch (ACE bandage) Discharge Instruction: Secure with ACE bandage as directed. Kerlix Roll Sterile, 4.5x3.1 (in/yd) Discharge Instruction: Secure with Kerlix as directed. Compression Wrap Compression Stockings Add-Ons Electronic Signature(s) Signed: 04/01/2023 3:33:50 PM By: Samuella Bruin Entered By: Samuella Bruin on 04/01/2023 05:59:21 -------------------------------------------------------------------------------- Vitals Details Patient Name: Date of Service: [name] Annia Belt, [name] [name] 04/01/2023 8:30 A M Medical Record Number: [id number] Patient Account Number: [id number] Date of Birth/Sex: Treating RN: [birth year] (52 y.o. [name] Cecelia Graciano: Julianne Handler Other Clinician: Referring Malesha Suliman: Treating Samrat Hayward/Extender: Koleen Nimrod Weeks in Treatment: 102 Vital Signs Time Taken: 08:42 Temperature (F): 97.9 Height (in): 67 Pulse (bpm): 104 Weight (lbs): 134 Respiratory Rate (breaths/min): 16 Body Mass Index (BMI): 21 Blood Pressure (mmHg): 128/64 Reference Range: 80 - 120 mg / dl Electronic Signature(s) Signed: 04/01/2023 3:33:50 PM By: Alger Simons, [name] ([id number]) [id number] Page 18 of 18 Entered By: Samuella Bruin on 04/01/2023 05:42:41

## 2023-04-06 LAB — DRUG SCREEN 764883 11+OXYCO+ALC+CRT-BUND
Amphetamines, Urine: NEGATIVE ng/mL
BENZODIAZ UR QL: NEGATIVE ng/mL
Barbiturate: NEGATIVE ng/mL
Cannabinoid Quant, Ur: NEGATIVE ng/mL
Cocaine (Metabolite): NEGATIVE ng/mL
Creatinine: 76.3 mg/dL (ref 20.0–300.0)
Ethanol: NEGATIVE %
Meperidine: NEGATIVE ng/mL
Methadone Screen, Urine: NEGATIVE ng/mL
Phencyclidine: NEGATIVE ng/mL
Propoxyphene: NEGATIVE ng/mL
Tramadol: NEGATIVE ng/mL
pH, Urine: 5.5 (ref 4.5–8.9)

## 2023-04-06 LAB — OXYCODONE/OXYMORPHONE, CONFIRM
OXYCODONE/OXYMORPH: POSITIVE — AB
OXYCODONE: >3000 ng/mL
OXYCODONE: POSITIVE — AB
OXYMORPHONE (GC/MS): >3000 ng/mL
OXYMORPHONE: POSITIVE — AB

## 2023-04-06 LAB — OPIATES CONFIRMATION, URINE: Opiates: NEGATIVE ng/mL

## 2023-04-10 ENCOUNTER — Other Ambulatory Visit: Payer: Self-pay

## 2023-04-10 DIAGNOSIS — S81809A Unspecified open wound, unspecified lower leg, initial encounter: Secondary | ICD-10-CM

## 2023-04-14 ENCOUNTER — Telehealth: Payer: Self-pay | Admitting: Family Medicine

## 2023-04-14 ENCOUNTER — Encounter (HOSPITAL_BASED_OUTPATIENT_CLINIC_OR_DEPARTMENT_OTHER): Payer: Medicaid Other | Admitting: General Surgery

## 2023-04-14 DIAGNOSIS — L97818 Non-pressure chronic ulcer of other part of right lower leg with other specified severity: Secondary | ICD-10-CM | POA: Diagnosis not present

## 2023-04-14 NOTE — Progress Notes (Signed)
[name], [name] ([id number]) [id number] Page 1 of 15 Visit Report for 04/14/2023 Arrival Information Details Patient Name: Date of Service: [name] [name] 04/14/2023 10:30 A M Medical Record Number: [id number] Patient Account Number: [id number] Date of Birth/Sex: Treating RN: 09-22-1970 (52 y.o. [name] Primary Care Ad Guttman: Julianne Handler Other Clinician: Referring Lester Crickenberger: Treating Risa Auman/Extender: Koleen Nimrod Weeks in Treatment: 103 Visit Information History Since Last Visit Added or deleted any medications: No Patient Arrived: Ambulatory Any new allergies or adverse reactions: No Arrival Time: 10:19 Had a fall or experienced change in No Accompanied By: self activities of daily living that may affect Transfer Assistance: None risk of falls: Patient Identification Verified: Yes Signs or symptoms of abuse/neglect since last visito No Secondary Verification Process Completed: Yes Hospitalized since last visit: No Patient Requires Transmission-Based Precautions: No Has Dressing in Place as Prescribed: Yes Patient Has Alerts: No Pain Present Now: Yes Electronic Signature(s) Signed: 04/14/2023 4:25:54 PM By: Zenaida Deed RN, BSN Entered By: Zenaida Deed on 04/14/2023 07:19:48 -------------------------------------------------------------------------------- Encounter Discharge Information Details Patient Name: Date of Service: [name], [name] 04/14/2023 10:30 A M Medical Record Number: [id number] Patient Account Number: [id number] Date of Birth/Sex: Treating RN: May 03, 1971 (52 y.o. [name] Primary Care Zephan Beauchaine: Julianne Handler Other Clinician: Referring Arvle Grabe: Treating Tabia Landowski/Extender: Koleen Nimrod Weeks in Treatment: [phone] Encounter Discharge Information Items Post Procedure Vitals Discharge Condition: Stable Temperature (F): 98.2 Ambulatory Status:  Ambulatory Pulse (bpm): 80 Discharge Destination: Home Respiratory Rate (breaths/min): 18 Transportation: Private Auto Blood Pressure (mmHg): 156/80 Accompanied By: self Schedule Follow-up Appointment: Yes Clinical Summary of Care: Patient Declined Electronic Signature(s) Signed: 04/14/2023 4:25:54 PM By: Zenaida Deed RN, BSN Entered By: Zenaida Deed on 04/14/2023 08:27:47 [name] ([id number]) [id number] Page 2 of 15 -------------------------------------------------------------------------------- Lower Extremity Assessment Details Patient Name: Date of Service: [name] [name] 04/14/2023 10:30 A M Medical Record Number: [id number] Patient Account Number: [id number] Date of Birth/Sex: Treating RN: [birth year] (52 y.o. [name] Primary Care Deanette Tullius: Julianne Handler Other Clinician: Referring Yaquelin Langelier: Treating Dearl Rudden/Extender: Koleen Nimrod Weeks in Treatment: 103 Edema Assessment Assessed: [Left: No] [Right: No] Edema: [Left: No] [Right: No] Calf Left: Right: Point of Measurement: 33 cm From Medial Instep 27.5 cm 29.5 cm Ankle Left: Right: Point of Measurement: 10 cm From Medial Instep 17.5 cm 21 cm Vascular Assessment Pulses: Dorsalis Pedis Palpable: [Left:Yes] [Right:Yes] Extremity colors, hair growth, and conditions: Extremity Color: [Left:Normal] [Right:Normal] Hair Growth on Extremity: [Left:No] [Right:No] Temperature of Extremity: [Left:Warm] [Right:Warm] Capillary Refill: [Left:< 3 seconds] [Right:< 3 seconds] Dependent Rubor: [Left:No No] [Right:No No] Electronic Signature(s) Signed: 04/14/2023 4:25:54 PM By: Zenaida Deed RN, BSN Entered By: Zenaida Deed on 04/14/2023 07:27:16 -------------------------------------------------------------------------------- Multi Wound Chart Details Patient Name: Date of Service: [name], [name] 04/14/2023 10:30 A M Medical Record Number:  [id number] Patient Account Number: [id number] Date of Birth/Sex: Treating RN: [birth year] (52 y.o. F) Primary Care Issis Lindseth: Julianne Handler Other Clinician: Referring Mariaha Ellington: Treating Shanley Furlough/Extender: Koleen Nimrod Weeks in Treatment: 103 Vital Signs Height(in): 67 Pulse(bpm): 80 Weight(lbs): 134 Blood Pressure(mmHg): 156/80 Body Mass Index(BMI): 21 Temperature(F): 98.2 Respiratory Rate(breaths/min): 18 [17:Photos:] [[id number] Page 3 of 15] Right, Lateral Lower Leg Right, Medial Ankle Left, Lateral Lower Leg Wound Location: Gradually Appeared Gradually Appeared Gradually Appeared Wounding Event: Sickle Cell Lesion Sickle Cell Lesion Sickle Cell Lesion Primary Etiology: N/A Venous Leg Ulcer N/A Secondary Etiology: Anemia, Sickle Cell Disease, Anemia, Sickle Cell Disease, Anemia, Sickle Cell Disease,  Comorbid History: Neuropathy Neuropathy Neuropathy 10/05/2012 06/26/2021 03/18/2023 Date Acquired: 103 93 1 Weeks of Treatment: Open Open Open Wound Status: No No No Wound Recurrence: Yes No No Clustered Wound: 10.3x7.8x0.2 13.4x6x0.2 5.1x3x0.1 Measurements L x W x D (cm) 63.099 63.146 12.017 A (cm) : rea 12.62 12.629 1.202 Volume (cm) : 66.40% -117.90% -240.00% % Reduction in Area: 32.80% -335.80% -240.50% % Reduction in Volume: Full Thickness Without Exposed Full Thickness Without Exposed Full Thickness Without Exposed Classification: Support Structures Support Structures Support Structures Large Large Medium Exudate Amount: Serosanguineous Serosanguineous Serous Exudate Type: red, brown red, brown amber Exudate Color: Distinct, outline attached Distinct, outline attached Distinct, outline attached Wound Margin: Medium (34-66%) Small (1-33%) None Present (0%) Granulation Amount: Pink Pink N/A Granulation Quality: Medium (34-66%) Large (67-100%) Large (67-100%) Necrotic Amount: Fat Layer (Subcutaneous  Tissue): Yes Fat Layer (Subcutaneous Tissue): Yes Fat Layer (Subcutaneous Tissue): Yes Exposed Structures: Fascia: No Fascia: No Fascia: No Tendon: No Tendon: No Tendon: No Muscle: No Muscle: No Muscle: No Joint: No Joint: No Joint: No Bone: No Bone: No Bone: No None None None Epithelialization: Scarring: Yes Scarring: Yes Scarring: Yes Periwound Skin Texture: No Abnormalities Noted No Abnormalities Noted No Abnormalities Noted Periwound Skin Moisture: Hemosiderin Staining: Yes No Abnormalities Noted No Abnormalities Noted Periwound Skin Color: Ecchymosis: No No Abnormality No Abnormality No Abnormality Temperature: Yes Yes Yes Tenderness on Palpation: Wound Number: [id number] N/A Photos: N/A Left, Medial Lower Leg Left Achilles N/A Wound Location: Gradually Appeared Gradually Appeared N/A Wounding Event: Sickle Cell Lesion Sickle Cell Lesion N/A Primary Etiology: N/A N/A N/A Secondary Etiology: Anemia, Sickle Cell Disease, Anemia, Sickle Cell Disease, N/A Comorbid History: Neuropathy Neuropathy 03/18/2023 03/18/2023 N/A Date Acquired: 1 1 N/A Weeks of Treatment: Open Open N/A Wound Status: No No N/A Wound Recurrence: No No N/A Clustered Wound: 2.8x3.8x0.1 1.5x1.4x0.1 N/A Measurements L x W x D (cm) 8.357 1.649 N/A A (cm) : rea 0.836 0.165 N/A Volume (cm) : -705.90% -0.90% N/A % Reduction in Area: -703.80% -1.20% N/A % Reduction in Volume: Full Thickness Without Exposed Full Thickness Without Exposed N/A Classification: Support Structures Support Structures Medium Medium N/A Exudate Amount: Serous Serous N/A Exudate Type: Media planner N/A Exudate Color: Distinct, outline attached Distinct, outline attached N/A Wound Margin: None Present (0%) Small (1-33%) N/A Granulation Amount: N/A Pink N/A Granulation Quality: Large (67-100%) Large (67-100%) N/A Necrotic Amount: Fat Layer (Subcutaneous Tissue): Yes Fat Layer (Subcutaneous Tissue): Yes  N/A Exposed Structures: Fascia: No Fascia: No Tendon: No Tendon: No Muscle: No Muscle: No Joint: No Joint: No Bone: No Bone: No None None N/A Epithelialization: Scarring: Yes Scarring: Yes N/A Periwound Skin Texture: No Abnormalities Noted No Abnormalities Noted N/A Periwound Skin MoistureKABREA, [name] ([id number]) [id number] Page 4 of 15 No Abnormalities Noted No Abnormalities Noted N/A Periwound Skin Color: No Abnormality No Abnormality N/A Temperature: Yes Yes N/A Tenderness on Palpation: Treatment Notes Electronic Signature(s) Signed: 04/14/2023 11:04:38 AM By: Duanne Guess MD FACS Entered By: Duanne Guess on 04/14/2023 08:04:37 -------------------------------------------------------------------------------- Multi-Disciplinary Care Plan Details Patient Name: Date of Service: [name], [name] 04/14/2023 10:30 A M Medical Record Number: [id number] Patient Account Number: [id number] Date of Birth/Sex: Treating RN: 08-Nov-1970 (52 y.o. [name] Primary Care Jakai Risse: Julianne Handler Other Clinician: Referring Coreen Shippee: Treating Cove Haydon/Extender: Koleen Nimrod Weeks in Treatment: 103 Multidisciplinary Care Plan reviewed with physician Active Inactive Necrotic Tissue Nursing Diagnoses: Impaired tissue integrity related to necrotic/devitalized tissue Knowledge deficit related to management of necrotic/devitalized tissue Goals: Necrotic/devitalized  tissue will be minimized in the wound bed Date Initiated: 12/04/2022 Target Resolution Date: 04/18/2023 Goal Status: Active Patient/caregiver will verbalize understanding of reason and process for debridement of necrotic tissue Date Initiated: 12/04/2022 Target Resolution Date: 04/18/2023 Goal Status: Active Interventions: Assess patient pain level pre-, during and post procedure and prior to discharge Provide education on necrotic tissue and debridement  process Treatment Activities: Apply topical anesthetic as ordered : 12/04/2022 Enzymatic debridement : 12/04/2022 Notes: Wound/Skin Impairment Nursing Diagnoses: Impaired tissue integrity Goals: Patient/caregiver will verbalize understanding of skin care regimen Date Initiated: 04/17/2021 Target Resolution Date: 05/12/2023 Goal Status: Active Ulcer/skin breakdown will have a volume reduction of 30% by week 4 Date Initiated: 04/17/2021 Date Inactivated: 05/29/2021 Target Resolution Date: 05/15/2021 Goal Status: Met Ulcer/skin breakdown will have a volume reduction of 50% by week 8 Date Initiated: 05/29/2021 Date Inactivated: 06/26/2021 Target Resolution Date: 06/26/2021 Goal Status: Unmet Unmet Reason: venous reflux Interventions: Assess patient/caregiver ability to obtain necessary supplies Assess patient/caregiver ability to perform ulcer/skin care regimen upon admission and as needed [name], [name] ([id number]) [id number] Page 5 of 15 Assess ulceration(s) every visit Provide education on ulcer and skin care Treatment Activities: Topical wound management initiated : 04/17/2021 Notes: 06/08/21: Left leg wounds greater than 30% volume reduction, right leg acute infection. Electronic Signature(s) Signed: 04/14/2023 4:25:54 PM By: Zenaida Deed RN, BSN Entered By: Zenaida Deed on 04/14/2023 07:42:15 -------------------------------------------------------------------------------- Pain Assessment Details Patient Name: Date of Service: [name], [name] 04/14/2023 10:30 A M Medical Record Number: [id number] Patient Account Number: [id number] Date of Birth/Sex: Treating RN: [birth year] (52 y.o. [name] Primary Care Celsa Nordahl: Julianne Handler Other Clinician: Referring Ott Zimmerle: Treating Mitchelle Sultan/Extender: Koleen Nimrod Weeks in Treatment: [phone] Active Problems Location of Pain Severity and Description of Pain Patient Has  Paino Yes Site Locations Pain Location: Pain in Ulcers With Dressing Change: Yes Duration of the Pain. Constant / Intermittento Constant Rate the pain. Current Pain Level: 9 Worst Pain Level: 10 Least Pain Level: 3 Character of Pain Describe the Pain: Burning Pain Management and Medication Current Pain Management: Medication: Yes Is the Current Pain Management Adequate: Inadequate How does your wound impact your activities of daily livingo Sleep: Yes Emotions: Yes Electronic Signature(s) Signed: 04/14/2023 4:25:54 PM By: Zenaida Deed RN, BSN Entered By: Zenaida Deed on 04/14/2023 07:21:50 [name] ([id number]) [id number] Page 6 of 15 -------------------------------------------------------------------------------- Patient/Caregiver Education Details Patient Name: Date of Service: [name], [place] [name] 11/25/2024andnbsp10:30 A M Medical Record Number: [id number] Patient Account Number: [id number] Date of Birth/Gender: Treating RN: 02-19-1971 (52 y.o. [name] Primary Care Physician: Julianne Handler Other Clinician: Referring Physician: Treating Physician/Extender: Vivianne Master in Treatment: [phone] Education Assessment Education Provided To: Patient Education Topics Provided Venous: Methods: Explain/Verbal Responses: Reinforcements needed, State content correctly Wound/Skin Impairment: Methods: Explain/Verbal Responses: Reinforcements needed, State content correctly Electronic Signature(s) Signed: 04/14/2023 4:25:54 PM By: Zenaida Deed RN, BSN Entered By: Zenaida Deed on 04/14/2023 07:42:38 -------------------------------------------------------------------------------- Wound Assessment Details Patient Name: Date of Service: [name], [name] 04/14/2023 10:30 A M Medical Record Number: [id number] Patient Account Number: [id number] Date of Birth/Sex: Treating RN: 10/14/1970 (52 y.o. [name] Primary Care Keilana Morlock: Julianne Handler Other Clinician: Referring Celita Aron: Treating Lavonta Tillis/Extender: Koleen Nimrod Weeks in Treatment: 103 Wound Status Wound Number: 17 Primary Etiology: Sickle Cell Lesion Wound Location: Right, Lateral Lower Leg Wound Status: Open Wounding Event: Gradually Appeared Comorbid History: Anemia, Sickle Cell Disease, Neuropathy Date Acquired: 10/05/2012 Weeks Of Treatment:  103 Clustered Wound: Yes Photos [name], [name] ([id number]) [id number] Page 7 of 15 Wound Measurements Length: (cm) 10.3 Width: (cm) 7.8 Depth: (cm) 0.2 Area: (cm) 63.099 Volume: (cm) 12.62 % Reduction in Area: 66.4% % Reduction in Volume: 32.8% Epithelialization: None Tunneling: No Undermining: No Wound Description Classification: Full Thickness Without Exposed Support Structures Wound Margin: Distinct, outline attached Exudate Amount: Large Exudate Type: Serosanguineous Exudate Color: red, brown Foul Odor After Cleansing: No Slough/Fibrino Yes Wound Bed Granulation Amount: Medium (34-66%) Exposed Structure Granulation Quality: Pink Fascia Exposed: No Necrotic Amount: Medium (34-66%) Fat Layer (Subcutaneous Tissue) Exposed: Yes Necrotic Quality: Adherent Slough Tendon Exposed: No Muscle Exposed: No Joint Exposed: No Bone Exposed: No Periwound Skin Texture Texture Color No Abnormalities Noted: No No Abnormalities Noted: No Scarring: Yes Ecchymosis: No Hemosiderin Staining: Yes Moisture No Abnormalities Noted: Yes Temperature / Pain Temperature: No Abnormality Tenderness on Palpation: Yes Treatment Notes Wound #17 (Lower Leg) Wound Laterality: Right, Lateral Cleanser Soap and Water Discharge Instruction: May shower and wash wound with dial antibacterial soap and water prior to dressing change. Wound Cleanser Discharge Instruction: Cleanse the wound with wound cleanser prior to applying a clean dressing  using gauze sponges, not tissue or cotton balls. Peri-Wound Care Triamcinolone 15 (g) Discharge Instruction: Use triamcinolone 15 (g) as directed Sween Lotion (Moisturizing lotion) Discharge Instruction: Apply moisturizing lotion as directed Topical Gentamicin Discharge Instruction: As directed by physician Mupirocin Ointment Discharge Instruction: Apply Mupirocin (Bactroban) as instructed Primary Dressing Santyl Ointment Discharge Instruction: Apply nickel thick amount to wound bed as instructed Secondary Dressing ABD Pad, 5x9 Discharge Instruction: Apply over primary dressing as directed. Woven Gauze Sponge, Non-Sterile 4x4 in Discharge Instruction: Moisten with normal saline and Apply over primary dressing as directed. Secured With Elastic Bandage 4 inch (ACE bandage) Discharge Instruction: Secure with ACE bandage as directed. Kerlix Roll Sterile, 4.5x3.1 (in/yd) Discharge Instruction: Secure with Kerlix as directed. Compression [place], Felecia Shelling ([id number]) [id number] Page 8 of 15 Compression Stockings Add-Ons Electronic Signature(s) Signed: 04/14/2023 4:25:54 PM By: Zenaida Deed RN, BSN Entered By: Zenaida Deed on 04/14/2023 07:35:43 -------------------------------------------------------------------------------- Wound Assessment Details Patient Name: Date of Service: [name], [name] 04/14/2023 10:30 A M Medical Record Number: [id number] Patient Account Number: [id number] Date of Birth/Sex: Treating RN: [birth year] (52 y.o. [name] Primary Care Arleigh Odowd: Julianne Handler Other Clinician: Referring Tron Flythe: Treating Harshaan Whang/Extender: Koleen Nimrod Weeks in Treatment: 103 Wound Status Wound Number: 21 Primary Etiology: Sickle Cell Lesion Wound Location: Right, Medial Ankle Secondary Etiology: Venous Leg Ulcer Wounding Event: Gradually Appeared Wound Status: Open Date Acquired: 06/26/2021 Comorbid  History: Anemia, Sickle Cell Disease, Neuropathy Weeks Of Treatment: 93 Clustered Wound: No Photos Wound Measurements Length: (cm) 13.4 Width: (cm) 6 Depth: (cm) 0.2 Area: (cm) 63.146 Volume: (cm) 12.629 % Reduction in Area: -117.9% % Reduction in Volume: -335.8% Epithelialization: None Tunneling: No Undermining: No Wound Description Classification: Full Thickness Without Exposed Support Structures Wound Margin: Distinct, outline attached Exudate Amount: Large Exudate Type: Serosanguineous Exudate Color: red, brown Foul Odor After Cleansing: No Slough/Fibrino Yes Wound Bed Granulation Amount: Small (1-33%) Exposed Structure Granulation Quality: Pink Fascia Exposed: No Necrotic Amount: Large (67-100%) Fat Layer (Subcutaneous Tissue) Exposed: Yes Necrotic Quality: Adherent Slough Tendon Exposed: No Muscle Exposed: No Joint Exposed: No Bone Exposed: No Periwound Skin Texture Texture Color No Abnormalities Noted: No No Abnormalities Noted: Yes Scarring: [name], [name] ([id number]) [id number] Page 9 of 15 Scarring: Yes Temperature / Pain Temperature: No Abnormality Moisture No Abnormalities Noted: Yes Tenderness on Palpation:  Yes Treatment Notes Wound #21 (Ankle) Wound Laterality: Right, Medial Cleanser Soap and Water Discharge Instruction: May shower and wash wound with dial antibacterial soap and water prior to dressing change. Wound Cleanser Discharge Instruction: Cleanse the wound with wound cleanser prior to applying a clean dressing using gauze sponges, not tissue or cotton balls. Peri-Wound Care Triamcinolone 15 (g) Discharge Instruction: Use triamcinolone 15 (g) as directed Sween Lotion (Moisturizing lotion) Discharge Instruction: Apply moisturizing lotion as directed Topical Gentamicin Discharge Instruction: As directed by physician Mupirocin Ointment Discharge Instruction: Apply Mupirocin (Bactroban) as  instructed Primary Dressing Santyl Ointment Discharge Instruction: Apply nickel thick amount to wound bed as instructed Secondary Dressing ABD Pad, 5x9 Discharge Instruction: Apply over primary dressing as directed. Woven Gauze Sponge, Non-Sterile 4x4 in Discharge Instruction: Moisten with normal saline and Apply over primary dressing as directed. Secured With Elastic Bandage 4 inch (ACE bandage) Discharge Instruction: Secure with ACE bandage as directed. Kerlix Roll Sterile, 4.5x3.1 (in/yd) Discharge Instruction: Secure with Kerlix as directed. Compression Wrap Compression Stockings Add-Ons Electronic Signature(s) Signed: 04/14/2023 4:25:54 PM By: Zenaida Deed RN, BSN Entered By: Zenaida Deed on 04/14/2023 07:36:24 -------------------------------------------------------------------------------- Wound Assessment Details Patient Name: Date of Service: [name], [name] 04/14/2023 10:30 A M Medical Record Number: [id number] Patient Account Number: [id number] Date of Birth/Sex: Treating RN: 06-11-1970 (52 y.o. [name] Primary Care Riggin Cuttino: Julianne Handler Other Clinician: Referring Rolene Andrades: Treating Ameya Vowell/Extender: Koleen Nimrod Weeks in Treatment: 103 Wound Status Wound Number: 26 Primary Etiology: Sickle Cell Lesion Wound Location: Left, Lateral Lower Leg Wound Status: Open Wounding Event: Gradually Appeared Comorbid History: Anemia, Sickle Cell Disease, Neuropathy [name], [name] ([id number]) [id number] Page 10 of 15 Date Acquired: 03/18/2023 Weeks Of Treatment: 1 Clustered Wound: No Photos Wound Measurements Length: (cm) 5.1 Width: (cm) 3 Depth: (cm) 0.1 Area: (cm) 12.017 Volume: (cm) 1.202 % Reduction in Area: -240% % Reduction in Volume: -240.5% Epithelialization: None Tunneling: No Undermining: No Wound Description Classification: Full Thickness Without Exposed Support Structures Wound  Margin: Distinct, outline attached Exudate Amount: Medium Exudate Type: Serous Exudate Color: amber Foul Odor After Cleansing: No Slough/Fibrino Yes Wound Bed Granulation Amount: None Present (0%) Exposed Structure Necrotic Amount: Large (67-100%) Fascia Exposed: No Necrotic Quality: Adherent Slough Fat Layer (Subcutaneous Tissue) Exposed: Yes Tendon Exposed: No Muscle Exposed: No Joint Exposed: No Bone Exposed: No Periwound Skin Texture Texture Color No Abnormalities Noted: No No Abnormalities Noted: Yes Scarring: Yes Temperature / Pain Temperature: No Abnormality Moisture No Abnormalities Noted: Yes Tenderness on Palpation: Yes Treatment Notes Wound #26 (Lower Leg) Wound Laterality: Left, Lateral Cleanser Soap and Water Discharge Instruction: May shower and wash wound with dial antibacterial soap and water prior to dressing change. Wound Cleanser Discharge Instruction: Cleanse the wound with wound cleanser prior to applying a clean dressing using gauze sponges, not tissue or cotton balls. Peri-Wound Care Triamcinolone 15 (g) Discharge Instruction: Use triamcinolone 15 (g) as directed Sween Lotion (Moisturizing lotion) Discharge Instruction: Apply moisturizing lotion as directed Topical Gentamicin Discharge Instruction: As directed by physician Mupirocin Ointment Discharge Instruction: Apply Mupirocin (Bactroban) as instructed Primary Dressing Maxorb Extra Ag+ Alginate Dressing, 4x4.75 (in/in) Handler, Felecia Shelling ([id number]) [id number] Page 11 of 15 Discharge Instruction: Apply to wound bed as instructed Santyl Ointment Discharge Instruction: Apply nickel thick amount to wound bed as instructed Secondary Dressing ABD Pad, 5x9 Discharge Instruction: Apply over primary dressing as directed. Woven Gauze Sponge, Non-Sterile 4x4 in Discharge Instruction: Moisten with normal saline and Apply over primary dressing as directed. Secured  With Elastic Bandage 4 inch (ACE bandage) Discharge Instruction: Secure with ACE bandage as directed. Kerlix Roll Sterile, 4.5x3.1 (in/yd) Discharge Instruction: Secure with Kerlix as directed. Compression Wrap Compression Stockings Add-Ons Electronic Signature(s) Signed: 04/14/2023 4:25:54 PM By: Zenaida Deed RN, BSN Entered By: Zenaida Deed on 04/14/2023 07:37:03 -------------------------------------------------------------------------------- Wound Assessment Details Patient Name: Date of Service: [name], [name] 04/14/2023 10:30 A M Medical Record Number: [id number] Patient Account Number: [id number] Date of Birth/Sex: Treating RN: 02-29-1971 (52 y.o. [name] Primary Care Danielle Lento: Julianne Handler Other Clinician: Referring Jannifer Fischler: Treating Maryjane Benedict/Extender: Koleen Nimrod Weeks in Treatment: 103 Wound Status Wound Number: 27 Primary Etiology: Sickle Cell Lesion Wound Location: Left, Medial Lower Leg Wound Status: Open Wounding Event: Gradually Appeared Comorbid History: Anemia, Sickle Cell Disease, Neuropathy Date Acquired: 03/18/2023 Weeks Of Treatment: 1 Clustered Wound: No Photos Wound Measurements Length: (cm) 2.8 Width: (cm) 3.8 Depth: (cm) 0.1 Area: (cm) 8.357 Volume: (cm) 0.836 % Reduction in Area: -705.9% % Reduction in Volume: -703.8% Epithelialization: None Tunneling: No Undermining: No Wound Description [name], [name] ([id number]) Classification: Full Thickness Without Exposed Support Structures Wound Margin: Distinct, outline attached Exudate Amount: Medium Exudate Type: Serous Exudate Color: amber [phone].pdf Page 12 of 15 Foul Odor After Cleansing: No Slough/Fibrino Yes Wound Bed Granulation Amount: None Present (0%) Exposed Structure Necrotic Amount: Large (67-100%) Fascia Exposed: No Necrotic Quality: Adherent Slough Fat Layer (Subcutaneous Tissue) Exposed: Yes Tendon  Exposed: No Muscle Exposed: No Joint Exposed: No Bone Exposed: No Periwound Skin Texture Texture Color No Abnormalities Noted: No No Abnormalities Noted: Yes Scarring: Yes Temperature / Pain Temperature: No Abnormality Moisture No Abnormalities Noted: Yes Tenderness on Palpation: Yes Treatment Notes Wound #27 (Lower Leg) Wound Laterality: Left, Medial Cleanser Soap and Water Discharge Instruction: May shower and wash wound with dial antibacterial soap and water prior to dressing change. Wound Cleanser Discharge Instruction: Cleanse the wound with wound cleanser prior to applying a clean dressing using gauze sponges, not tissue or cotton balls. Peri-Wound Care Triamcinolone 15 (g) Discharge Instruction: Use triamcinolone 15 (g) as directed Sween Lotion (Moisturizing lotion) Discharge Instruction: Apply moisturizing lotion as directed Topical Gentamicin Discharge Instruction: As directed by physician Mupirocin Ointment Discharge Instruction: Apply Mupirocin (Bactroban) as instructed Primary Dressing Maxorb Extra Ag+ Alginate Dressing, 4x4.75 (in/in) Discharge Instruction: Apply to wound bed as instructed Santyl Ointment Discharge Instruction: Apply nickel thick amount to wound bed as instructed Secondary Dressing ABD Pad, 5x9 Discharge Instruction: Apply over primary dressing as directed. Woven Gauze Sponge, Non-Sterile 4x4 in Discharge Instruction: Moisten with normal saline and Apply over primary dressing as directed. Secured With Elastic Bandage 4 inch (ACE bandage) Discharge Instruction: Secure with ACE bandage as directed. Kerlix Roll Sterile, 4.5x3.1 (in/yd) Discharge Instruction: Secure with Kerlix as directed. Compression Wrap Compression Stockings Add-Ons Electronic Signature(s) Signed: 04/14/2023 4:25:54 PM By: Zenaida Deed RN, BSN Entered By: Zenaida Deed on 04/14/2023 07:37:45 [name] ([id number]) [id number] Page 13  of 15 -------------------------------------------------------------------------------- Wound Assessment Details Patient Name: Date of Service: [name] [name] 04/14/2023 10:30 A M Medical Record Number: [id number] Patient Account Number: [id number] Date of Birth/Sex: Treating RN: Nov 14, 1970 (52 y.o. [name] Primary Care Nandi Tonnesen: Julianne Handler Other Clinician: Referring Tonae Livolsi: Treating Harlynn Kimbell/Extender: Koleen Nimrod Weeks in Treatment: 103 Wound Status Wound Number: 28 Primary Etiology: Sickle Cell Lesion Wound Location: Left Achilles Wound Status: Open Wounding Event: Gradually Appeared Comorbid History: Anemia, Sickle Cell Disease, Neuropathy Date Acquired: 03/18/2023 Weeks Of Treatment: 1 Clustered Wound: No Photos  Wound Measurements Length: (cm) 1.5 Width: (cm) 1.4 Depth: (cm) 0.1 Area: (cm) 1.649 Volume: (cm) 0.165 % Reduction in Area: -0.9% % Reduction in Volume: -1.2% Epithelialization: None Tunneling: No Undermining: No Wound Description Classification: Full Thickness Without Exposed Support Wound Margin: Distinct, outline attached Exudate Amount: Medium Exudate Type: Serous Exudate Color: amber Structures Foul Odor After Cleansing: No Slough/Fibrino Yes Wound Bed Granulation Amount: Small (1-33%) Exposed Structure Granulation Quality: Pink Fascia Exposed: No Necrotic Amount: Large (67-100%) Fat Layer (Subcutaneous Tissue) Exposed: Yes Necrotic Quality: Adherent Slough Tendon Exposed: No Muscle Exposed: No Joint Exposed: No Bone Exposed: No Periwound Skin Texture Texture Color No Abnormalities Noted: Yes No Abnormalities Noted: Yes Moisture Temperature / Pain No Abnormalities Noted: Yes Temperature: No Abnormality Tenderness on Palpation: Yes Treatment Notes Wound #28 (Achilles) Wound Laterality: Left [name] ([id number]) [id number].pdf Page 14 of 15 Cleanser Soap and  Water Discharge Instruction: May shower and wash wound with dial antibacterial soap and water prior to dressing change. Wound Cleanser Discharge Instruction: Cleanse the wound with wound cleanser prior to applying a clean dressing using gauze sponges, not tissue or cotton balls. Peri-Wound Care Triamcinolone 15 (g) Discharge Instruction: Use triamcinolone 15 (g) as directed Sween Lotion (Moisturizing lotion) Discharge Instruction: Apply moisturizing lotion as directed Topical Gentamicin Discharge Instruction: As directed by physician Mupirocin Ointment Discharge Instruction: Apply Mupirocin (Bactroban) as instructed Primary Dressing Maxorb Extra Ag+ Alginate Dressing, 4x4.75 (in/in) Discharge Instruction: Apply to wound bed as instructed Santyl Ointment Discharge Instruction: Apply nickel thick amount to wound bed as instructed Secondary Dressing ABD Pad, 5x9 Discharge Instruction: Apply over primary dressing as directed. Woven Gauze Sponge, Non-Sterile 4x4 in Discharge Instruction: Moisten with normal saline and Apply over primary dressing as directed. Secured With Elastic Bandage 4 inch (ACE bandage) Discharge Instruction: Secure with ACE bandage as directed. Kerlix Roll Sterile, 4.5x3.1 (in/yd) Discharge Instruction: Secure with Kerlix as directed. Compression Wrap Compression Stockings Add-Ons Electronic Signature(s) Signed: 04/14/2023 4:25:54 PM By: Zenaida Deed RN, BSN Entered By: Zenaida Deed on 04/14/2023 07:38:39 -------------------------------------------------------------------------------- Vitals Details Patient Name: Date of Service: [name], [name] 04/14/2023 10:30 A M Medical Record Number: [id number] Patient Account Number: [id number] Date of Birth/Sex: Treating RN: 08-Oct-1970 (52 y.o. [name] Primary Care Felice Deem: Julianne Handler Other Clinician: Referring Chase Knebel: Treating Torii Royse/Extender: Koleen Nimrod Weeks in  Treatment: 103 Vital Signs Time Taken: 10:19 Temperature (F): 98.2 Height (in): 67 Pulse (bpm): 80 Weight (lbs): 134 Respiratory Rate (breaths/min): 18 Body Mass Index (BMI): 21 Blood Pressure (mmHg): 156/80 Reference Range: 80 - 120 mg / dl Electronic Signature(s) SHARONA, MANAK ([id number]) [phone].pdf Page 15 of 15 Signed: 04/14/2023 4:25:54 PM By: Zenaida Deed RN, BSN Entered By: Zenaida Deed on 04/14/2023 07:20:12

## 2023-04-14 NOTE — Progress Notes (Addendum)
THANIA, [name] ([id number]) [id number] Page 1 of 22 Visit Report for 04/14/2023 Chief Complaint Document Details Patient Name: Date of Service: [name] [name] 04/14/2023 10:30 A M Medical Record Number: [id number] Patient Account Number: [id number] Date of Birth/Sex: Treating RN: 1970/08/13 (52 y.o. F) Primary Care Provider: Julianne Handler Other Clinician: Referring Provider: Treating Provider/Extender: Koleen Nimrod Weeks in Treatment: [phone] Information Obtained from: Patient Chief Complaint the patient is here for evaluation of her bilateral lower extremity sickle cell ulcers 04/17/2021; patient comes in for substantial wounds on the right and left lower leg Electronic Signature(s) Signed: 04/14/2023 11:04:46 AM By: Duanne Guess MD FACS Entered By: Duanne Guess on 04/14/2023 08:04:46 -------------------------------------------------------------------------------- Debridement Details Patient Name: Date of Service: [name], [name] 04/14/2023 10:30 A M Medical Record Number: [id number] Patient Account Number: [id number] Date of Birth/Sex: Treating RN: [birth year] (52 y.o. [name] Primary Care Provider: Julianne Handler Other Clinician: Referring Provider: Treating Provider/Extender: Koleen Nimrod Weeks in Treatment: 103 Debridement Performed for Assessment: Wound #17 Right,Lateral Lower Leg Performed By: Clinician Zenaida Deed, RN Debridement Type: Chemical/Enzymatic/Mechanical Agent Used: Santyl Level of Consciousness (Pre-procedure): Awake and Alert Pre-procedure Verification/Time Out No Taken: Pain Control: Lidocaine 4% Topical Solution Percent of Wound Bed Debrided: Bleeding: None Response to Treatment: Procedure was tolerated well Level of Consciousness (Post- Awake and Alert procedure): Post Debridement Measurements of Total Wound Length: (cm) 10.3 Width: (cm) 7.8 Depth: (cm)  0.2 Volume: (cm) 12.62 Character of Wound/Ulcer Post Debridement: Requires Further Debridement Post Procedure Diagnosis Same as Pre-procedure Electronic Signature(s) Signed: 04/14/2023 12:19:03 PM By: Duanne Guess MD FACS Signed: 04/14/2023 4:25:54 PM By: Zenaida Deed RN, BSN [place], Felecia Shelling ([id number]) PM By: Zenaida Deed RN, BSN [phone].pdf Page 2 of 22 Signed: 04/14/2023 4:25:54 Entered By: Zenaida Deed on 04/14/2023 08:21:56 -------------------------------------------------------------------------------- Debridement Details Patient Name: Date of Service: [name] [name] 04/14/2023 10:30 A M Medical Record Number: [id number] Patient Account Number: [id number] Date of Birth/Sex: Treating RN: [birth year] (52 y.o. [name] Primary Care Provider: Julianne Handler Other Clinician: Referring Provider: Treating Provider/Extender: Koleen Nimrod Weeks in Treatment: 103 Debridement Performed for Assessment: Wound #21 Right,Medial Ankle Performed By: Serafina Royals, RN Debridement Type: Chemical/Enzymatic/Mechanical Agent Used: Santyl Severity of Tissue Pre Debridement: Fat layer exposed Level of Consciousness (Pre-procedure): Awake and Alert Pre-procedure Verification/Time Out No Taken: Pain Control: Lidocaine 4% Topical Solution Percent of Wound Bed Debrided: Bleeding: None Response to Treatment: Procedure was tolerated well Level of Consciousness (Post- Awake and Alert procedure): Post Debridement Measurements of Total Wound Length: (cm) 13.4 Width: (cm) 6 Depth: (cm) 0.1 Volume: (cm) 6.315 Character of Wound/Ulcer Post Debridement: Requires Further Debridement Severity of Tissue Post Debridement: Fat layer exposed Post Procedure Diagnosis Same as Pre-procedure Electronic Signature(s) Signed: 04/14/2023 12:19:03 PM By: Duanne Guess MD FACS Signed: 04/14/2023 4:25:54 PM By: Zenaida Deed  RN, BSN Entered By: Zenaida Deed on 04/14/2023 08:22:27 -------------------------------------------------------------------------------- Debridement Details Patient Name: Date of Service: [name], [name] 04/14/2023 10:30 A M Medical Record Number: [id number] Patient Account Number: [id number] Date of Birth/Sex: Treating RN: December 01, 1970 (52 y.o. [name] Primary Care Provider: Julianne Handler Other Clinician: Referring Provider: Treating Provider/Extender: Koleen Nimrod Weeks in Treatment: [phone] Debridement Performed for Assessment: Wound #26 Left,Lateral Lower Leg Performed By: Clinician Zenaida Deed, RN Debridement Type: Chemical/Enzymatic/Mechanical Agent Used: Santyl Level of Consciousness (Pre-procedure): Awake and Alert Pre-procedure Verification/Time Out No Taken: Pain Control: Lidocaine 4% Topical Solution Percent of Wound Bed  Debrided: Bleeding: None [name], [name] ([id number]) [id number] Page 3 of 22 Response to Treatment: Procedure was tolerated well Level of Consciousness (Post- Awake and Alert procedure): Post Debridement Measurements of Total Wound Length: (cm) 5.1 Width: (cm) 3 Depth: (cm) 0.1 Volume: (cm) 1.202 Character of Wound/Ulcer Post Debridement: Requires Further Debridement Post Procedure Diagnosis Same as Pre-procedure Electronic Signature(s) Signed: 04/14/2023 12:19:03 PM By: Duanne Guess MD FACS Signed: 04/14/2023 4:25:54 PM By: Zenaida Deed RN, BSN Entered By: Zenaida Deed on 04/14/2023 08:22:55 -------------------------------------------------------------------------------- Debridement Details Patient Name: Date of Service: [name], [name] 04/14/2023 10:30 A M Medical Record Number: [id number] Patient Account Number: [id number] Date of Birth/Sex: Treating RN: [birth year] (52 y.o. [name] Primary Care Provider: Julianne Handler Other Clinician: Referring  Provider: Treating Provider/Extender: Koleen Nimrod Weeks in Treatment: 103 Debridement Performed for Assessment: Wound #27 Left,Medial Lower Leg Performed By: Clinician Zenaida Deed, RN Debridement Type: Chemical/Enzymatic/Mechanical Agent Used: Santyl Level of Consciousness (Pre-procedure): Awake and Alert Pre-procedure Verification/Time Out No Taken: Pain Control: Lidocaine 4% Topical Solution Percent of Wound Bed Debrided: Bleeding: None Response to Treatment: Procedure was tolerated well Level of Consciousness (Post- Awake and Alert procedure): Post Debridement Measurements of Total Wound Length: (cm) 2.8 Width: (cm) 3.8 Depth: (cm) 0.1 Volume: (cm) 0.836 Character of Wound/Ulcer Post Debridement: Requires Further Debridement Post Procedure Diagnosis Same as Pre-procedure Electronic Signature(s) Signed: 04/14/2023 12:19:03 PM By: Duanne Guess MD FACS Signed: 04/14/2023 4:25:54 PM By: Zenaida Deed RN, BSN Entered By: Zenaida Deed on 04/14/2023 08:23:20 Debridement Details -------------------------------------------------------------------------------- [name] ([id number]) [id number] Page 4 of 22 Patient Name: Date of Service: [name], [place] [name] 04/14/2023 10:30 A M Medical Record Number: [id number] Patient Account Number: [id number] Date of Birth/Sex: Treating RN: May 09, 1971 (51 y.o. [name] Primary Care Provider: Julianne Handler Other Clinician: Referring Provider: Treating Provider/Extender: Koleen Nimrod Weeks in Treatment: 103 Debridement Performed for Assessment: Wound #28 Left Achilles Performed By: Clinician Zenaida Deed, RN Debridement Type: Chemical/Enzymatic/Mechanical Agent Used: Santyl Level of Consciousness (Pre-procedure): Awake and Alert Pre-procedure Verification/Time Out No Taken: Pain Control: Lidocaine 4% Topical Solution Percent of Wound Bed  Debrided: Bleeding: None Response to Treatment: Procedure was tolerated well Level of Consciousness (Post- Awake and Alert procedure): Post Debridement Measurements of Total Wound Length: (cm) 1.5 Width: (cm) 1.4 Depth: (cm) 0.1 Volume: (cm) 0.165 Character of Wound/Ulcer Post Debridement: Requires Further Debridement Post Procedure Diagnosis Same as Pre-procedure Electronic Signature(s) Signed: 04/14/2023 12:19:03 PM By: Duanne Guess MD FACS Signed: 04/14/2023 4:25:54 PM By: Zenaida Deed RN, BSN Entered By: Zenaida Deed on 04/14/2023 08:23:49 -------------------------------------------------------------------------------- HPI Details Patient Name: Date of Service: [name], [name] 04/14/2023 10:30 A M Medical Record Number: [id number] Patient Account Number: [id number] Date of Birth/Sex: Treating RN: 09-03-1970 (52 y.o. F) Primary Care Provider: Julianne Handler Other Clinician: Referring Provider: Treating Provider/Extender: Koleen Nimrod Weeks in Treatment: 103 History of Present Illness Location: medial and lateral ankle region on the right and left medial malleolus Quality: Patient reports experiencing a shooting pain to affected area(s). Severity: Patient states wound(s) are getting worse. Duration: right lower extremity bimalleolar ulcers have been present for approximately 2 years; the right medial malleolus ulcer has been there proximally 6 months Timing: Pain in wound is constant (hurts all the time) Context: The wound would happen gradually ssociated Signs and Symptoms: Patient reports having increase discharge. A HPI Description: 52 year old patient with a history of sickle cell anemia who was last seen by me with ulceration  of the right lower extremity above the ankle and was referred to Dr. Marzetta Board for a surgical debridement as I was unable to do anything in the office due to excruciating pain. At that stage she was referred from the  plastic surgery service to dermatology who treated her for a skin infection with doxycycline and then Levaquin and a local antibiotic ointment. I understand the patient has since developed ulceration on the left ankle both medial and lateral and was now referred back to the wound center as dermatology has finished the management. I do not have any notes from the dermatology department Old notes: 52 year old patient with a history of sickle cell anemia, pain bilateral lower extremities, right lower extremity ulcer and has a history of receiving a skin graft( Theraskin) several months ago. She has been visiting the wound center [facility] and was seen by Dr. Leanord Hawking and Dr. Marzetta Board. after prolonged conservator therapy between July 2016 and January 2017. She had been seen by the plastic surgeon and taken to the OR for debridement and application of Theraskin. She had 3 applications of Theraskin and was then treated with collagen. Prior to that she had a history of similar problems in 2014 and was treated conservatively. Had a reflux study done for the right lower extremity in August 2016 without reflux or DVT . Past medical history significant for sickle cell disease, anemia, leg ulcers, cholelithiasis,and has never been a smoker. Once the patient was discharged on the wound center she says within 2 or 3 weeks the problems recurred and she has been treating it conservatively. [name], [name] ([id number]) [id number] Page 5 of 22 since I saw her 3 weeks ago at [facility] she has been unable to get her dressing material but has completed a course of doxycycline. 6/7/ 2017 -- lower extremity venous duplex reflux evaluation was done No evidence of SVT or DVT in the RLL. No venous incompetence in the RLL. No further vascular workup is indicated at this time. She was seen by Dr. Mina Marble, on 10/04/2015. She agreed with the plan of taking her to the OR for debridement and  application of theraskin and would also take biopsies to rule out pyoderma gangrenosum. Follow-up note dated May 31 received and she was status post application of Theraskin to multiple ulcers around the right ankle. Pathology did not show evidence of malignancy or pyoderma gangrenosum. She would continue to see as in the wound clinic for further care and see Dr. Marzetta Board as needed. The patient brought the biopsy report and it was consistent with stasis ulcer no evidence of malignancy and the comment was that there was some adjacent neovascularization, fibrosis and patchy perivascular chronic inflammation. 11/15/2015 -- today we applied her first application of Theraskin 11/30/15; TheraSkin #2 12/13/2015 -- she is having a lot of pain locally and is here for possible application of a theraskin today. 01/16/2016 -- the patient has significant pain and has noticed despite in spite of all local care and oral pain medication. It is impossible to debride her in the office. 02/06/2016 -- I do not see any notes from Dr. Leta Baptist( the patient has not made a call to the office know as she heard from them) and the only visit to recently was with her PCP Dr. Gypsy Decant -- I saw her on 01/16/2016 and prescribed 90 tablets of oxycodone 10 mg and did lab work and screening for HIV. the HIV was negative and hemoglobin was 6.3 with a WBC count of 14.9 and  hematocrit of 17.8 with platelets of 561. 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 The bimalleolar ulcers to the right lower extremity have been present for approximately 2 years, the left medial malleolus ulcer has been present approximately 6 months. She has followed with Dr.Thimmappa in the past and has had a total of 3 applications of Theraskin (01/2015, 09/2015, 06/17/16). She has also followed with Dr. Meyer Russel here in the clinic and has  received 2 applications of TheraSkin (11/10/15, 11/30/15). The patient does experience chronic, and is not amenable to debridement. 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 She is not currently on any antibiotic therapy and has not been treated with any recently. 07/17/2016 -- was seen by Dr. Leta Baptist of plastic surgery who saw her 2 weeks postop application of Theraskin #3. She had removed her dressing and asked her to apply silver alginate on alternate days and follow-up back with the wound center. Future debridements and application of skin substitute would have to be done in the hospital due to her high risk for anesthesia. READMISSION 04/17/2021 Patient is now a 52 year old woman that we have had in this clinic for a prolonged period of time and 2016-2017 and then again for 2 visits in February 2018. At that point she had wounds on the right lower leg predominantly medial. She had also been seen by plastic surgery Dr. Marzetta Board who I believe took her to the OR for operative debridement and application of TheraSkin in 2017. After she left our clinic she was followed for a very prolonged period of time in the wound care center in [facility] who then referred her ultimately to [facility] where she was seen by Dr. Mardene Speak. Again taken her to the OR for skin grafting which apparently did not take. She had multiple other attempts at dressings although I have not really looked over all of these notes in great detail. She has not been seen in a wound care center in about a year. She states over the last year in addition to her right lower leg she has developed wounds on the left lower leg quite extensive. She is using Xeroform to all of these wounds without really any improvement. She also has Medicaid which does not cover wound products. The patient has had vascular work-ups in the past including most recently on 03/28/2021 showing biphasic waveforms on the right  triphasic at the PTA and biphasic at the dorsalis pedis on the left. She was unable to tolerate any degree of compression to do ABIs. Unfortunately TBI's were also not done. She had venous reflux studies done in 2017. This did not show any evidence of a DVT or SVT and no venous incompetence was noted in the right leg at the time this was the only side with the wound As noted I did not look all over her old records. She apparently had a course of HBO and Baptist although I am not sure what the indication would have been. In any case she developed seizures and terminated treatment earlier. She is generally much more disabled than when we last saw her in clinic. She can no longer walk pretty much wheelchair-bound because predominantly of pain in the left hip. 04/24/2021; the patient tolerated the wraps we put on. We used Santyl and Hydrofera Blue under compression. I brought her back for a nurse visit for a  change in dressing. With Medicaid we will have a hard time getting anything paid for and hence the need for compression. She arrives in clinic with all the wounds looking somewhat better in terms of surface 12/20; circumferential wound on the right from the lateral to the medial. She has open areas on the left medial and left lateral x2 on all of this with the same surface. This does not look completely healthy although she does have some epithelialization. She is not complaining of a lot of pain which is unusual for her sickle ulcers. I have not looked over her extensive records from [facility]. She had recent arterial studies and has a history of venous reflux studies I will need to look these over although I do not believe she has significant arterial disease 2023 05/22/2021; patient's wound areas measure slightly smaller. Still a lot of drainage coming from the right we have been using Hydrofera Blue and Santyl with some improvement in the wound surfaces. She tells me she will be getting transfused  later in the week for her underlying sickle cell anemia I have looked over her recent arterial studies which were done in the fall. This was in November and showed biphasic and triphasic waveforms but she could not tolerate ABIs because of pressure and unfortunately TBI's were not done. She has not had recent venous reflux studies that I can see 1/10; not much change about the same surface area. This has a yellowish surface to it very gritty. We have been using Santyl and Hydrofera Blue for a prolonged period. Culture I did last week showed methicillin sensitive staph aureus "rare". Our intake nurse reports greenish drainage which may be the Hydrofera Blue itself 1/17; wounds are continue to measure smaller although I am not sure about the accuracy here. Especially the areas on the right are covered in what looks to be a nonviable surface although she does have some epithelialization. Similarly she has areas on the left medial and left lateral ankle area which appear to have a better surface and perhaps are slightly smaller. We have been using Santyl and Hydrofera Blue. She cannot tolerate mechanical debridements She went for her reflux studies which showed significant reflux at the greater saphenous vein at the saphenofemoral junction as well as the greater saphenous vein in the proximal calf on the left she had reflux in the thigh and the common femoral vein and supra vein Fishel vein reflux in the greater saphenous vein. I will have vein and vascular look at this. My thoughts have been that these are likely sickle wounds. I looked through her old records from [facility] wound care center and then when she graduated to [facility] wound care center where she saw Dr. Ronda Fairly and Dr. Mardene Speak. Although I can see she had reflux studies done I do not see that she actually saw a vein and vascular. I went over the fact that she had operative debridements and actual skin grafting that did not take. I do  not think these wounds have ever really progressed towards healing 1/31;Substantial wounds on the right ankle area. Hyper granulated very gritty adherent debris on the surface. 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 On the left she has no DVT or SVT deep vein reflux in the common femoral vein. Superficial vein reflux in the greater saphenous vein on although the vein diameter is not really all that large. I do not [name], [name] ([id number]) [id number] Page 6 of 22 think there is anything that can be done with these although I am going to send her for consultation to vein and vascular. 2/7; Wound exam; substantial wound area on the right posterior ankle area and areas on the left medial ankle and left lateral ankle. I was able to debride the left medial ankle last week fairly aggressively and it is back this week to a completely nonviable surface She will see vascular surgery this Friday and I would like them to review the venous studies and also any comments on her arterial status. If they do not see an issue here I am going to refer her to plastic surgery for an operative debridement perhaps intraoperative ACell or Integra. Eventually she will require a deep tissue culture again 2/14; substantial wound area on the right posterior ankle, medial ankle. We have been using silver alginate The patient was seen by vein and vascular she had both venous reflux studies and arterial studies. 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 She had no  evidence of superficial venous thrombosis. She did have superficial vein reflux at the saphenofemoral junction and the greater saphenous vein. On the left no evidence of a DVT no evidence of superficial venous throat thrombosis she did have deep vein reflux in the common femoral vein and superficial vein reflux in the greater saphenous vein but these were not felt to be amenable to ablation. In terms of arterial studies she had triphasic and wife biphasic wave waveforms bilaterally not felt to have a significant arterial issue. I do not get the feeling that they felt that any part of her nonhealing wounds were related to either arterial or venous issues. They did note that she had venous reflux at the right at the [facility] and [facility]. And also on the left there were reflux in the deep system at the common femoral vein and greater saphenous vein in the proximal thigh. Nothing amenable to ablation. 2/20; she is making some decent progress on the right where there is nice skin between the 2 open areas on the right ankle. The surfaces here do not look viable yet there is some surrounding epithelialization. She still has a small area on the left medial ankle area. Hyper-granulated Jody's away always 2/28 patient has an appointment with plastic surgery on 3/8. We will see her back on 3/9. She may have to call us to get the area redressed. We've been using Santyl under silver alginate. We made a nice improvement on the left medial ankle. The larger wounds on the right also looks somewhat better in terms of epithelialization although I think they could benefit from an aggressive debridement if plastic surgery would be willing to do that. Perhaps placement of Integra or a cell 07/26/2021: She saw Dr. Arita Miss yesterday. He raised the question as to whether or not this might be pyoderma and wanted to wait until that question was answered by dermatology before proceeding with any sort of operative debridement. We have continue  to use Santyl under silver alginate with Kerlix and Coban wraps. Overall, her  wounds appear to be continuing to contract and epithelialize, with some granulation tissue present. There continues to be some slough on all wound surfaces. 08/09/2021: She has not been able to get an appointment with dermatology because apparently the offices in [facility] and [place] do not accept Medicaid. She is looking into whether or not she can be seen at the main [facility] dermatology clinic. This is necessary because plastic surgery is concerned that her wounds might represent pyoderma and they did not want to do any procedure until that was clarified. We have been using Santyl under silver alginate with Kerlix and Coban wraps. Today, there was a greater amount of drainage on her dressings with a slight green discoloration and significant odor. Despite this, her wounds continue to contract and epithelialize. There is pale granulation tissue present and actually, on the left medial ankle, the granulation tissue is a bit hypertrophic. 08/16/2021: Last week, I took a culture and this grew back rare methicillin-resistant Staph aureus and rare corynebacterium. The MRSA was sensitive to gentamicin which we began applying topically on an empiric basis. This week, her wounds are a bit smaller and the drainage and odor are less. Her primary care provider is working on assisting the patient with a dermatology evaluation. She has been in silver alginate over the gentamicin that was started last week along with Kerlix and Coban wraps. 08/23/2021: Because she has Medicaid, we have been unable to get her into see any dermatologist in the [place] to rule out pyoderma gangrenosum, which was a requirement from plastic surgery prior to any sort of debridement and grafting. Despite this, however, all of her wounds continue to get smaller. The wound on her left medial ankle is nearly closed. There is no odor from the wounds,  although she still accumulates a modest amount of drainage on her dressings. 08/30/2021: The lateral right ankle wound and the medial left ankle wound are a bit smaller today. The medial right ankle wound is about the same size. They are less tender. We have still been unable to get her into dermatology. 09/06/2021: All of the wounds are about the same size today. She continues to endorse minimal pain. I communicated with Dr. Arita Miss in plastic surgery regarding our issues getting a dermatology appointment; he was out of town but indicated that he would look into perhaps performing the biopsy in his office and will have his office contact her. 09/14/2021: The patient has an appointment in dermatology, but it is not until October. Her wounds are roughly the same; she continues to have very thick purulent-looking drainage on her dressings. 09/20/2021: The left medial wound is nearly closed and just has a bit of accumulated eschar on the surface. The right medial and lateral ankle wounds are perhaps a little bit smaller. They continue to have a very pale surface with accumulation of thin slough. 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 I did not think Jodie Echevaria was indicated based on this. She is getting topical mupirocin with Prisma silver collagen. 10/04/2021: The patient was not seen in clinic last week due to childcare coverage issues. In the interim, the left medial leg wound has closed. The right sided leg wounds are smaller. There is more granulation tissue coming through, particularly on the lateral wound. The surface remains somewhat gritty. We have been applying topical mupirocin and Prisma silver collagen. 10/11/2021: The left medial leg wound remains closed. She does complain of some anesthetic sensation to the  area. Both of the right-sided leg wounds are smaller but still have accumulated slough. 10/18/2021: Both right-sided leg wounds are minimally smaller this week. She  still continues to accumulate slough and has thick drainage on her dressings. 10/23/2021: Both wounds continue to contract. There is still slough buildup. She has been approved for a keratin-based skin substitute trial product but it will not be available until next week. 10/30/2021: The wounds are about the same to perhaps slightly smaller. There is still continued slough buildup. Unfortunately, the rep for the keratin based product did not show up today and did not answer his phone when called. 11/08/2021: The wounds are little bit smaller today. She continues to have thick drainage but the surfaces are relatively clean with just a little bit of slough accumulation. She reported to me today that she is unable to completely flex her left ankle and on examination it seems this is potentially related to scar tissue from her wounds. We do have the ProgenaMatrix trial product available for her today. 11/15/2021: Both wounds are smaller today. There is some slough accumulation on the surfaces, but the medial wound, in particular looks like it is filling in and is less deep. She did hear from physical therapy and she is going to start working with them on July 11. She is here for her second application of the trial skin substitute, ProgenaMatrix. 11/22/2021: Both wounds continue to contract, the medial more dramatically than the lateral. Both wounds have a layer of slough on the surface, but underneath this, the gritty fibrous tissue has a little bit more of a pink cast to it rather than being as pale as it has been. 11/29/2021: The wounds are roughly the same size this week, perhaps a millimeter or 2 smaller. The medial wound has filled in and is nearly flush with the surrounding skin surface. She continues to have a lot of slough accumulation on both surfaces. 12/06/2021: No significant change to her wounds, but she has a new opening on her dorsal foot, just distal to the right lateral ankle wound. The area on  her left [place], Felecia Shelling ([id number]) [id number] Page 7 of 22 medial ankle that reopened looks a little bit larger today. She has quite a bit of pain associated with the new wound. 12/12/2021: Her wounds look about the same but the new opening on her right lateral dorsal foot is a little bit bigger. She continues to have a fair amount of pain with this wound. 12/26/2021: The left medial ankle wound is tiny and superficial. She has 2 areas of crusting on her left lateral ankle, however, that appear to be threatening to open again. Her right medial ankle wound is a little bit smaller today but still continues to accumulate thick rubbery slough. The new dorsal foot wound is exquisitely painful but there is no odor or purulent drainage. No erythema or induration. The right lateral ankle wound looks about the same today, again with thick rubbery slough. 01/03/2022: The left medial ankle wound has closed again. Both right ankle wounds appear to be about the same size with thick rubbery slough. The dorsal foot wound on the right continues to be quite painful and she stated that she did not want any debridement of that site today. 01/10/2022: No real change to any of her wounds. She continues to accumulate thick slough. The dorsal foot wound has merged with the lateral malleolar wound. She is experiencing significant pain in the dorsal foot portion of the ulcer. 01/16/2022: Absolutely  no change or progress in her wounds. 01/24/2022: Her wounds are unchanged. She continues to build up slough and the wounds on her dorsal right foot are still exquisitely tender. 01/31/2022: The wounds actually measure a little bit narrower today. They still have thick slough on the surface but the underlying tissue seems a little less fibrotic. We changed to Iodosorb last week. 02/07/2022: Wounds continue to slowly epithelialize around the parameters. She has less pain today. She still accumulates a fairly  substantial layer of slough. 02/15/2022: No change to the wounds overall. The measured a little bit larger today per the intake nurse. She continues to have substantial slough accumulation and her pain is a little bit worse. 02/21/2022. No change at all to her wounds. I do not see that plastic surgery has received her referral yet. 02/28/2022: The wounds remain unchanged. They are dry and fibrotic with accumulation of slough and eschar. She did receive an appointment to see plastic surgery on October 23, but the office called her back and indicated they needed to reschedule and that the next available appointment was not until December. The patient became angry and decided she did not want to see this plastics group. 10/19; the patient sees Dr. Maylene Roes again next week. She is using Medihoney as the primary dressing changing this herself. 03/21/2022: She saw Dr. Ferd Hibbs at the wound care center at [facility]. Dr. Ferd Hibbs was also unable to debride her, secondary to pain and is planning to take her to the operating room for operative debridement and potential skin substitute placement. Her wounds are unchanged with thick yellow slough and a fibrotic base. She says they are too painful to be debrided. In addition, yesterday her hemoglobin was 4 and she received 2 units packed red blood cell transfusion. 04/04/2022: Her operation at [facility] is scheduled for December 15. She continues to use Medihoney on her wounds. They are a little bit less painful today and she is willing to entertain the possibility of debridement. The wounds measured a little bit larger in all dimensions today but the layer of slough is not as thick as usual. 04/18/2022: Her wounds actually measured a little bit smaller today and the extension onto the dorsal foot from the lateral wound has healed. They are less tender and there is significantly less slough present as compared to prior visits. 05/09/2022: She  had to reschedule her surgery due to lack of childcare. It is now planned for January 5. The wounds are basically unchanged, but she had a lot more drainage, which was thick and somewhat purulent in appearance. No significant odor. 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 They are quite painful today and she does not want to have debridement performed. 05/30/2022: The patient underwent surgical debridement and placement of TheraSkin to her wounds on January 5. She has been doing well and does not endorse any pain. The TheraSkin is intact and looks beautiful. It is sutured in place. There is no exudate or significant drainage. 06/04/2022: Her TheraSkin remains intact and I am starting to see granulation tissue emerging underneath the surface. No concern for infection. 06/12/2022: The TheraSkin is intact and there is more granulation tissue emerging. It is starting to look a little bit dry, however. No malodor or purulent drainage. 06/19/2022: The TheraSkin continues to look good. The edges are a bit dry, but the central portion of each of her wounds is showing a nice pink color. 06/27/2022: The TheraSkin on the lateral wound  remains almost completely intact. There is nice pink tissue peaking through the mesh of the TheraSkin. On the medial leg, it is starting to breakdown a little bit, but most of the TheraSkin remains intact here, as well. Both wounds measured smaller today. 07/03/2022: The wounds are fairly dry around the perimeter today. Some of the suture is hanging loose. 07/12/2022: Both wounds are measuring smaller today. The medial ulcer is getting a bit too dry. The lateral ulcer continues to have nice buds of granulation tissue emerging. 07/26/2022: The moisture balance on both wounds is much better today. For the first time since I began seeing her, I see actual beefy red granulation tissue on the lateral ankle wound. There are more buds of deep pink granulation tissue emerging on the  medial ankle. There is some dry eschar around the edges of the medial wound and a tiny amount of slough overlying the good granulation tissue on the lateral wound. 08/09/2022: Unfortunately, she has a new wound on her left lateral lower leg, just above the ankle. It is extremely painful for her. It penetrates to the fat layer and has the same woody, fibrotic character as her previous wounds. The other wounds are both a little bit smaller with slough and some eschar around the periphery. 08/14/2022: Her right lateral ankle wound is a little bit smaller. Unfortunately, she has had an increase in her drainage and it has a purulent nature to it, similar to what was present prior to her surgical debridement. She also reports odor coming from her wounds this week. The left lateral leg wound remains extremely tender. 08/21/2022: The left lateral ankle wound is larger and more painful today. Both right ankle wounds have a little bit more vital appearance, but they have accumulated more slough. She reports less odor this week. She is currently taking Augmentin. 08/28/2022: The left lateral ankle wound continues to enlarge and remains quite painful. Both of the right ankle wounds actually look better this week. There is improved tissue quality with less slough accumulation. 09/27/2022: The left lateral ankle wound actually looks nearly healed. She says that she has been applying some silver alginate that she had leftover. She was unable to afford the Santyl that was prescribed, but found a tube that she had on hand at home and has been using this on her right ankle wounds. These wounds look about the same and have accumulated the usual layer of slough. Unfortunately, a portion of the dorsal foot wound has reopened and is exquisitely painful today. [name], [name] ([id number]) [id number] Page 8 of 22 10/09/2022: The left lateral ankle wound is healed. The dorsal foot wound appears to have  expanded and remains exquisitely painful. The medial and lateral right ankle wounds look about the same size, but they are quite a bit cleaner. 10/23/2022: The left lateral ankle remains closed. The dorsal foot wound has expanded to the point that it now converges with the right lateral leg wound. She has accumulated fairly thick slough on all of the open surfaces. 11/06/2022: No significant change to any of her wounds, although the dorsal foot aspect of the right lateral wound is a little bit smaller. She has been approved for the [employer] and [employer] for [employer] and will be able to get a full year's supply. 11/20/2022: The dorsal foot aspect of the right lateral wound has almost completely closed. I am seeing nice pink tissue starting to emerge from the fibrotic surface of both wounds. The medial leg wound appears  to be a little bit smaller today. She has been using Santyl. 12/04/2022: The dorsal foot aspect of the right lateral wound has closed. There is more pink granulation tissue emerging on her wounds. There are buds of epithelium beginning to fill in from the edges of her wound, particularly at the proximal aspect of the medial wound and the distal aspect of the lateral wound. Significantly less slough formation than in the past. 12/18/2022: The wounds measured about the same size today, but there is better granulation tissue filling in and the overall surface consistency is improving. She does have slough accumulation on the surfaces, but no malodor or purulent drainage. 01/01/2023: No significant change to her wounds. The surface seems a little bit more fibrotic today. She is complaining of more pain. 01/15/2023: Her wounds are stable today. She has been taking Augmentin based on the culture that I took at her previous visit. She says that she has felt physically better since being on the antibiotic and has had less drainage from her wounds. 01/29/2023: The wounds are unchanged and  remained fibrotic and painful. She does report having less pain while on antibiotics and less drainage, as well. 02/07/2023: Her wounds have gotten larger and more painful. She completed her course of antibiotics yesterday. She still has a fair amount of drainage coming from her wounds. 02/24/2023: Her wounds continue to enlarge. They are more painful. 03/18/2023: Her wounds continue to enlarge and become more painful. The systemic antibiotics do not seem to have made any difference this round. She is having more drainage, as well. 04/01/2023: Her wounds are about the same size today. They have a thick layer of slough on both the medial and lateral aspect. Underneath, however, the tissue is not quite as pale as it usually appears. She does have 3 new wounds on her left lower leg, 1 lateral and 2 medial. They are covered with slough and eschar and are exquisitely painful. She has been applying silver alginate to these at home. Her case was discussed in the [facility] conference recently. Several suggestions were made including Lidoderm patches, vascular referral for an angiogram, and potentially further surgery. They also suggested getting a TCOM done with the Lidoderm patch in situ. They agreed with the decision to put her on pentoxifylline. 04/14/2023: Her wounds have worsened across the board. All of them are larger and exquisitely painful. Her insurance would not cover the Lidoderm patches. She has an appointment with vascular surgery coming up next week. Electronic Signature(s) Signed: 04/14/2023 11:08:43 AM By: Duanne Guess MD FACS Entered By: Duanne Guess on 04/14/2023 08:08:43 -------------------------------------------------------------------------------- Physical Exam Details Patient Name: Date of Service: [name], [name] 04/14/2023 10:30 A M Medical Record Number: [id number] Patient Account Number: [id number] Date of Birth/Sex: Treating RN: [birth year] (52 y.o.  F) Primary Care Provider: Julianne Handler Other Clinician: Referring Provider: Treating Provider/Extender: Koleen Nimrod Weeks in Treatment: 103 Constitutional Hypertensive, asymptomatic. . . . She is crying and quite upset today. Respiratory Normal work of breathing on room air.. Notes 04/14/2023: All of her wounds have gotten larger today. They are exquisitely painful, such that I can barely touch them, even to wipe away the topical lidocaine was applied. There is slough that has accumulated on all of the surfaces there is some increased pink coloration to the tissue, but overall it remains quite fibrotic and pale. Electronic Signature(s) Signed: 04/14/2023 11:11:03 AM By: Duanne Guess MD FACS Entered By: Duanne Guess on 04/14/2023 08:11:03 [name] (  [id number]) [id number] Page 9 of 22 -------------------------------------------------------------------------------- Physician Orders Details Patient Name: Date of Service: [name] [name] 04/14/2023 10:30 A M Medical Record Number: [id number] Patient Account Number: [id number] Date of Birth/Sex: Treating RN: 1970/07/30 (52 y.o. 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 - Dr. Lady Gary Anesthetic (In clinic) Topical Lidocaine 4% applied to wound bed Bathing/ Shower/  Hygiene May shower and wash wound with soap and water. Additional Orders / Instructions Follow Nutritious Diet Juven Shake 1-2 times daily. Lymphedema Treatment Plan - Exercise, Compression and Elevation Elevate legs 30 - 60 minutes at or above heart level at least 3 - 4 times daily as able/tolerated Avoid standing for long periods and elevate leg(s) parallel to the floor when sitting Wound Treatment Wound #17 - Lower Leg Wound Laterality: Right, Lateral Cleanser: Soap and Water Every Other Day/30 Days Discharge Instructions: May shower and wash wound with dial antibacterial soap and water prior to dressing change. Cleanser: Wound Cleanser Every Other Day/30 Days Discharge Instructions: Cleanse the wound with wound cleanser prior to applying a clean dressing using gauze sponges, not tissue or cotton balls. Peri-Wound Care: Triamcinolone 15 (g) Every Other Day/30 Days Discharge Instructions: Use triamcinolone 15 (g) as directed Peri-Wound Care: Sween Lotion (Moisturizing lotion) Every Other Day/30 Days Discharge Instructions: Apply moisturizing lotion as directed Topical: Gentamicin Every Other Day/30 Days Discharge Instructions: As directed by physician Topical: Mupirocin Ointment Every Other Day/30 Days Discharge Instructions: Apply Mupirocin (Bactroban) as instructed Prim Dressing: Santyl Ointment Every Other Day/30 Days ary Discharge Instructions: Apply nickel thick amount to wound bed as instructed Secondary Dressing: ABD Pad, 5x9 Every Other Day/30 Days Discharge Instructions: Apply over primary dressing as directed. Secondary Dressing: Woven Gauze Sponge, Non-Sterile 4x4 in Every Other Day/30 Days Discharge Instructions: Moisten with normal saline and Apply over primary dressing as directed. Secured With: Elastic Bandage 4 inch (ACE bandage) Every Other Day/30 Days Discharge Instructions: Secure with ACE bandage as directed. Secured With: [employer], 4.5x3.1 (in/yd)  Every Other Day/30 Days [name], [name] ([id number]) [id number] Page 10 of 22 Discharge Instructions: Secure with Kerlix as directed. Wound #21 - Ankle Wound Laterality: Right, Medial Cleanser: Soap and Water Every Other Day/30 Days Discharge Instructions: May shower and wash wound with dial antibacterial soap and water prior to dressing change. Cleanser: Wound Cleanser Every Other Day/30 Days Discharge Instructions: Cleanse the wound with wound cleanser prior to applying a clean dressing using gauze sponges, not tissue or cotton balls. Peri-Wound Care: Triamcinolone 15 (g) Every Other Day/30 Days Discharge Instructions: Use triamcinolone 15 (g) as directed Peri-Wound Care: Sween Lotion (Moisturizing lotion) Every Other Day/30 Days Discharge Instructions: Apply moisturizing lotion as directed Topical: Gentamicin Every Other Day/30 Days Discharge Instructions: As directed by physician Topical: Mupirocin Ointment Every Other Day/30 Days Discharge Instructions: Apply Mupirocin (Bactroban) as instructed Prim Dressing: Santyl Ointment Every Other Day/30 Days ary Discharge Instructions: Apply nickel thick amount to wound bed as instructed Secondary Dressing: ABD Pad, 5x9 Every Other Day/30 Days Discharge Instructions: Apply over primary  dressing as directed. Secondary Dressing: Woven Gauze Sponge, Non-Sterile 4x4 in Every Other Day/30 Days Discharge Instructions: Moisten with normal saline and Apply over primary dressing as directed. Secured With: Elastic Bandage 4 inch (ACE bandage) Every Other Day/30 Days Discharge Instructions: Secure with ACE bandage as directed. Secured With: [employer], 4.5x3.1 (in/yd) Every Other Day/30 Days Discharge Instructions: Secure with Kerlix as directed. Wound #26 - Lower Leg Wound Laterality: Left, Lateral Cleanser: Soap and Water Every Other Day/30 Days Discharge Instructions: May shower and wash wound with dial  antibacterial soap and water prior to dressing change. Cleanser: Wound Cleanser Every Other Day/30 Days Discharge Instructions: Cleanse the wound with wound cleanser prior to applying a clean dressing using gauze sponges, not tissue or cotton balls. Peri-Wound Care: Triamcinolone 15 (g) Every Other Day/30 Days Discharge Instructions: Use triamcinolone 15 (g) as directed Peri-Wound Care: Sween Lotion (Moisturizing lotion) Every Other Day/30 Days Discharge Instructions: Apply moisturizing lotion as directed Topical: Gentamicin Every Other Day/30 Days Discharge Instructions: As directed by physician Topical: Mupirocin Ointment Every Other Day/30 Days Discharge Instructions: Apply Mupirocin (Bactroban) as instructed Prim Dressing: Maxorb Extra Ag+ Alginate Dressing, 4x4.75 (in/in) Every Other Day/30 Days ary Discharge Instructions: Apply to wound bed as instructed Prim Dressing: Santyl Ointment Every Other Day/30 Days ary Discharge Instructions: Apply nickel thick amount to wound bed as instructed Secondary Dressing: ABD Pad, 5x9 Every Other Day/30 Days Discharge Instructions: Apply over primary dressing as directed. Secondary Dressing: Woven Gauze Sponge, Non-Sterile 4x4 in Every Other Day/30 Days Discharge Instructions: Moisten with normal saline and Apply over primary dressing as directed. Secured With: Elastic Bandage 4 inch (ACE bandage) Every Other Day/30 Days Discharge Instructions: Secure with ACE bandage as directed. Secured With: [employer], 4.5x3.1 (in/yd) Every Other Day/30 Days Discharge Instructions: Secure with Kerlix as directed. Wound #27 - Lower Leg Wound Laterality: Left, Medial Cleanser: Soap and Water Every Other Day/30 Days Discharge Instructions: May shower and wash wound with dial antibacterial soap and water prior to dressing change. [name], [name] ([id number]) [id number] Page 11 of 22 Cleanser: Wound Cleanser Every Other Day/30  Days Discharge Instructions: Cleanse the wound with wound cleanser prior to applying a clean dressing using gauze sponges, not tissue or cotton balls. Peri-Wound Care: Triamcinolone 15 (g) Every Other Day/30 Days Discharge Instructions: Use triamcinolone 15 (g) as directed Peri-Wound Care: Sween Lotion (Moisturizing lotion) Every Other Day/30 Days Discharge Instructions: Apply moisturizing lotion as directed Topical: Gentamicin Every Other Day/30 Days Discharge Instructions: As directed by physician Topical: Mupirocin Ointment Every Other Day/30 Days Discharge Instructions: Apply Mupirocin (Bactroban) as instructed Prim Dressing: Maxorb Extra Ag+ Alginate Dressing, 4x4.75 (in/in) Every Other Day/30 Days ary Discharge Instructions: Apply to wound bed as instructed Prim Dressing: Santyl Ointment Every Other Day/30 Days ary Discharge Instructions: Apply nickel thick amount to wound bed as instructed Secondary Dressing: ABD Pad, 5x9 Every Other Day/30 Days Discharge Instructions: Apply over primary dressing as directed. Secondary Dressing: Woven Gauze Sponge, Non-Sterile 4x4 in Every Other Day/30 Days Discharge Instructions: Moisten with normal saline and Apply over primary dressing as directed. Secured With: Elastic Bandage 4 inch (ACE bandage) Every Other Day/30 Days Discharge Instructions: Secure with ACE bandage as directed. Secured With: [employer], 4.5x3.1 (in/yd) Every Other Day/30 Days Discharge Instructions: Secure with Kerlix as directed. Wound #28 - Achilles Wound Laterality: Left Cleanser: Soap and Water Every Other Day/30 Days Discharge Instructions: May shower and wash wound with dial antibacterial soap and water prior to dressing change. Cleanser: Wound  Cleanser Every Other Day/30 Days Discharge Instructions: Cleanse the wound with wound cleanser prior to applying a clean dressing using gauze sponges, not tissue or cotton balls. Peri-Wound Care: Triamcinolone 15 (g)  Every Other Day/30 Days Discharge Instructions: Use triamcinolone 15 (g) as directed Peri-Wound Care: Sween Lotion (Moisturizing lotion) Every Other Day/30 Days Discharge Instructions: Apply moisturizing lotion as directed Topical: Gentamicin Every Other Day/30 Days Discharge Instructions: As directed by physician Topical: Mupirocin Ointment Every Other Day/30 Days Discharge Instructions: Apply Mupirocin (Bactroban) as instructed Prim Dressing: Maxorb Extra Ag+ Alginate Dressing, 4x4.75 (in/in) Every Other Day/30 Days ary Discharge Instructions: Apply to wound bed as instructed Prim Dressing: Santyl Ointment Every Other Day/30 Days ary Discharge Instructions: Apply nickel thick amount to wound bed as instructed Secondary Dressing: ABD Pad, 5x9 Every Other Day/30 Days Discharge Instructions: Apply over primary dressing as directed. Secondary Dressing: Woven Gauze Sponge, Non-Sterile 4x4 in Every Other Day/30 Days Discharge Instructions: Moisten with normal saline and Apply over primary dressing as directed. Secured With: Elastic Bandage 4 inch (ACE bandage) Every Other Day/30 Days Discharge Instructions: Secure with ACE bandage as directed. Secured With: [employer], 4.5x3.1 (in/yd) Every Other Day/30 Days Discharge Instructions: Secure with Kerlix as directed. Electronic Signature(s) Signed: 04/14/2023 12:19:03 PM By: Duanne Guess MD FACS Signed: 04/14/2023 4:25:54 PM By: Zenaida Deed RN, BSN Sprague, Felecia Shelling ([id number]) [id number] Page 12 of 22 Entered By: Zenaida Deed on 04/14/2023 08:26:02 -------------------------------------------------------------------------------- Problem List Details Patient Name: Date of Service: [name] [name] 04/14/2023 10:30 A M Medical Record Number: [id number] Patient Account Number: [id number] Date of Birth/Sex: Treating RN: February 22, 1971 (52 y.o. 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 04/17/2021 No Yes severity L97.828 Non-pressure chronic ulcer of other part of left lower leg with other specified 04/01/2023 No Yes severity D57.1 Sickle-cell disease without crisis 04/17/2021 No Yes Inactive Problems Resolved Problems ICD-10 Code Description Active Date Resolved Date L97.822 Non-pressure chronic ulcer of other part of left lower leg with fat layer exposed 08/09/2022 08/09/2022 Electronic Signature(s) Signed: 04/14/2023 11:04:16 AM By: Duanne Guess MD FACS Entered By: Duanne Guess on 04/14/2023 08:04:16 -------------------------------------------------------------------------------- Progress Note Details Patient Name: Date of Service: [name], [name] 04/14/2023 10:30 A M Medical Record Number: [id number] Patient Account Number: [id number] Date of Birth/Sex: Treating RN: [birth year] (52 y.o. F) Primary Care Provider: Julianne Handler Other Clinician: Referring Provider: Treating Provider/Extender: Koleen Nimrod Weeks in Treatment: 103 Subjective Chief Complaint Information obtained from Patient MAKAELYN, EIB ([id number]) [id number] Page 13 of 22 the patient is here for evaluation of her bilateral lower extremity sickle cell ulcers 04/17/2021; patient comes in for substantial wounds on the right and left lower leg History of Present Illness (HPI) The following HPI elements were documented for the patient's wound: Location: medial and lateral ankle region on the right and left medial malleolus Quality: Patient reports experiencing a shooting pain to affected area(s). Severity: Patient states wound(s) are getting  worse. Duration: right lower extremity bimalleolar ulcers have been present for approximately 2 years; the right medial malleolus ulcer has been there proximally 6 months Timing: Pain in wound is constant (hurts all the time) Context: The wound would happen gradually Associated Signs and Symptoms: Patient reports having increase discharge. 53 year old patient with a history of  sickle cell anemia who was last seen by me with ulceration of the right lower extremity above the ankle and was referred to Dr. Marzetta Board for a surgical debridement as I was unable to do anything in the office due to excruciating pain. At that stage she was referred from the plastic surgery service to dermatology who treated her for a skin infection with doxycycline and then Levaquin and a local antibiotic ointment. I understand the patient has since developed ulceration on the left ankle both medial and lateral and was now referred back to the wound center as dermatology has finished the management. I do not have any notes from the dermatology department Old notes: 52 year old patient with a history of sickle cell anemia, pain bilateral lower extremities, right lower extremity ulcer and has a history of receiving a skin graft( Theraskin) several months ago. She has been visiting the wound center [facility] and was seen by Dr. Leanord Hawking and Dr. Marzetta Board. after prolonged conservator therapy between July 2016 and January 2017. She had been seen by the plastic surgeon and taken to the OR for debridement and application of Theraskin. She had 3 applications of Theraskin and was then treated with collagen. Prior to that she had a history of similar problems in 2014 and was treated conservatively. Had a reflux study done for the right lower extremity in August 2016 without reflux or DVT . Past medical history significant for sickle cell disease, anemia, leg ulcers, cholelithiasis,and has never been a smoker. Once the patient was  discharged on the wound center she says within 2 or 3 weeks the problems recurred and she has been treating it conservatively. since I saw her 3 weeks ago at [facility] she has been unable to get her dressing material but has completed a course of doxycycline. 6/7/ 2017 -- lower extremity venous duplex reflux evaluation was done  No evidence of SVT or DVT in the RLL. No venous incompetence in the RLL. No further vascular workup is indicated at this time. She was seen by Dr. Mina Marble, on 10/04/2015. She agreed with the plan of taking her to the OR for debridement and application of theraskin and would also take biopsies to rule out pyoderma gangrenosum. Follow-up note dated May 31 received and she was status post application of Theraskin to multiple ulcers around the right ankle. Pathology did not show evidence of malignancy or pyoderma gangrenosum. She would continue to see as in the wound clinic for further care and see Dr. Marzetta Board as needed. The patient brought the biopsy report and it was consistent with stasis ulcer no evidence of malignancy and the comment was that there was some adjacent neovascularization, fibrosis and patchy perivascular chronic inflammation. 11/15/2015 -- today we applied her first application of Theraskin 11/30/15; TheraSkin #2 12/13/2015 -- she is having a lot of pain locally and is here for possible application of a theraskin today. 01/16/2016 -- the patient has significant pain and has noticed despite in spite of all local care and oral pain medication. It is impossible to debride her in the office. 02/06/2016 -- I do not see any notes from Dr. Leta Baptist( the patient has not made a call to the office know as she heard from them) and the only visit to recently was with her PCP Dr. Gypsy Decant -- I saw her on 01/16/2016 and prescribed 90 tablets of oxycodone 10 mg and did lab work and screening for HIV. the HIV was negative and hemoglobin was 6.3 with a WBC  count of 14.9 and hematocrit of 17.8 with platelets of 561. 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 The bimalleolar ulcers to the right lower extremity have been present for approximately 2 years, the left medial malleolus ulcer has been present approximately 6 months. She has followed with Dr.Thimmappa in the past and has had a total of 3 applications of Theraskin (01/2015, 09/2015, 06/17/16). She has also followed with Dr. Meyer Russel here in the clinic and has received 2 applications of TheraSkin (11/10/15, 11/30/15). The patient does experience chronic, and is not amenable to debridement. 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 She is not currently on any antibiotic therapy and has not been treated with any recently. 07/17/2016 -- was seen by Dr. Leta Baptist of plastic surgery who saw her 2 weeks postop application of Theraskin #3. She had removed her dressing and asked her to apply silver alginate on alternate days and follow-up back with the wound center. Future debridements and application of skin substitute would have to be done in the hospital due to her high risk for anesthesia. READMISSION 04/17/2021 Patient is now a 52 year old woman that we have had in this clinic for a prolonged period of time and 2016-2017 and then again for 2 visits in February 2018. At that point she had wounds on the right lower leg predominantly medial. She had also been seen by plastic surgery Dr. Marzetta Board who I believe took her to the OR for operative debridement and application of TheraSkin in 2017. After she left our clinic she was followed for a very prolonged period of time in the wound care center in [facility] who then referred her ultimately to [facility] where she was seen by Dr. Mardene Speak. Again taken her to the OR for skin grafting which apparently did not take.  She had multiple other attempts at dressings although I have not really looked over all of these notes in great detail. She has not been seen in a wound care center in about a year. She states over the last year in addition to her right lower leg she has developed wounds on the left lower leg quite extensive. She is using Xeroform to all of these wounds without really any improvement. She also has Medicaid which does not cover wound products. The patient has had vascular work-ups in the past including most recently on 03/28/2021 showing biphasic waveforms on the right triphasic at the PTA and biphasic at the dorsalis pedis on the left. She was unable to tolerate any degree of compression to do ABIs. Unfortunately TBI's were also not done. She had venous reflux studies done in 2017. This did not show any evidence of a DVT or SVT and no venous incompetence was noted in the right leg at the time this was the only side with the wound As noted I did not look all over her old records. She apparently had a course of HBO and Baptist although I am not sure what the indication would have been. In any case she developed seizures and terminated treatment earlier. She is generally much more disabled than when we last saw her in clinic. She can no longer walk pretty much wheelchair-bound because predominantly of pain in the left hip. 04/24/2021; the patient tolerated the wraps we put on. We used Santyl and Hydrofera Blue under compression. I brought her back for a nurse  visit for a change in dressing. With Medicaid we will have a hard time getting anything paid for and hence the need for compression. She arrives in clinic with all the wounds looking somewhat better in terms of surface 12/20; circumferential wound on the right from the lateral to the medial. She has open areas on the left medial and left lateral x2 on all of this with the same surface. This does not look completely healthy although she does have some  epithelialization. She is not complaining of a lot of pain which is unusual for her sickle ulcers. I have not looked over her extensive records from [facility]. She had recent arterial studies and has a history of venous reflux studies I will need to look these over although I do not believe she has significant arterial disease 2023 BETTYJANE, KULHANEK ([id number]) [id number] Page 14 of 22 05/22/2021; patient's wound areas measure slightly smaller. Still a lot of drainage coming from the right we have been using Hydrofera Blue and Santyl with some improvement in the wound surfaces. She tells me she will be getting transfused later in the week for her underlying sickle cell anemia I have looked over her recent arterial studies which were done in the fall. This was in November and showed biphasic and triphasic waveforms but she could not tolerate ABIs because of pressure and unfortunately TBI's were not done. She has not had recent venous reflux studies that I can see 1/10; not much change about the same surface area. This has a yellowish surface to it very gritty. We have been using Santyl and Hydrofera Blue for a prolonged period. Culture I did last week showed methicillin sensitive staph aureus "rare". Our intake nurse reports greenish drainage which may be the Hydrofera Blue itself 1/17; wounds are continue to measure smaller although I am not sure about the accuracy here. Especially the areas on the right are covered in what looks to be a nonviable surface although she does have some epithelialization. Similarly she has areas on the left medial and left lateral ankle area which appear to have a better surface and perhaps are slightly smaller. We have been using Santyl and Hydrofera Blue. She cannot tolerate mechanical debridements She went for her reflux studies which showed significant reflux at the greater saphenous vein at the saphenofemoral junction as well as the greater  saphenous vein in the proximal calf on the left she had reflux in the thigh and the common femoral vein and supra vein Fishel vein reflux in the greater saphenous vein. I will have vein and vascular look at this. My thoughts have been that these are likely sickle wounds. I looked through her old records from [facility] wound care center and then when she graduated to [name] wound care center where she saw Dr. Ronda Fairly and Dr. Mardene Speak. Although I can see she had reflux studies done I do not see that she actually saw a vein and vascular. I went over the fact that she had operative debridements and actual skin grafting that did not take. I do not think these wounds have ever really progressed towards healing 1/31;Substantial wounds on the right ankle area. Hyper granulated very gritty adherent debris on the surface. 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 On the left she has no DVT or SVT deep vein reflux in the common femoral vein. Superficial vein reflux in the greater saphenous vein on although the vein diameter is not really all that large. I do not think there is anything that can be done with these although I am going to send her for consultation to vein and vascular. 2/7; Wound exam; substantial wound area on the right posterior ankle area and areas on the left medial ankle and left lateral ankle. I was able to debride the left medial ankle last week fairly aggressively and it is back this week to a completely nonviable surface She will see vascular surgery this Friday and I would like them to review the venous studies and also any  comments on her arterial status. If they do not see an issue here I am going to refer her to plastic surgery for an operative debridement perhaps intraoperative ACell or Integra. Eventually she will require a deep tissue culture again 2/14; substantial wound area on the right posterior ankle, medial ankle. We have been using silver alginate The patient was seen by vein and vascular she had both venous reflux studies and arterial studies. 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 She had no evidence of superficial venous thrombosis. She did have superficial vein reflux at the saphenofemoral junction and the greater saphenous vein. On the left no evidence of a DVT no evidence of superficial venous throat thrombosis she did have deep vein reflux in the common femoral vein and superficial vein reflux in the greater saphenous vein but these were not felt to be amenable to ablation. In terms of arterial studies she had triphasic and wife biphasic wave waveforms bilaterally not felt to have a significant arterial issue. I do not get the feeling that they felt that any part of her nonhealing wounds were related to either arterial or venous issues. They did note that she had venous reflux at the right at the [facility] and [facility]. And also on the left there were reflux in the deep system at the common femoral vein and greater saphenous vein in the proximal thigh. Nothing amenable to ablation. 2/20; she is making some decent progress on the right where there is nice skin between the 2 open areas on the right ankle. The surfaces here do not look viable yet there is some surrounding epithelialization. She still has a small area on the left medial ankle area. Hyper-granulated Jody's away always 2/28 patient has an appointment with plastic surgery on 3/8. We will see her back on 3/9. She may have to call us to get the area redressed. We've been  using Santyl under silver alginate. We made a nice improvement on the left medial ankle. The larger wounds on the right also looks somewhat better in terms of epithelialization although I think they could benefit from an aggressive debridement if plastic surgery would be willing to do that. Perhaps placement of Integra or a cell 07/26/2021: She saw Dr. Arita Miss yesterday. He raised the question as to whether or not this might be pyoderma and wanted to wait until that question was answered by dermatology before proceeding with any sort of operative debridement. We have continue to use Santyl under silver alginate with Kerlix and Coban  wraps. Overall, her wounds appear to be continuing to contract and epithelialize, with some granulation tissue present. There continues to be some slough on all wound surfaces. 08/09/2021: She has not been able to get an appointment with dermatology because apparently the offices in [facility] and [place] do not accept Medicaid. She is looking into whether or not she can be seen at the main [facility] dermatology clinic. This is necessary because plastic surgery is concerned that her wounds might represent pyoderma and they did not want to do any procedure until that was clarified. We have been using Santyl under silver alginate with Kerlix and Coban wraps. Today, there was a greater amount of drainage on her dressings with a slight green discoloration and significant odor. Despite this, her wounds continue to contract and epithelialize. There is pale granulation tissue present and actually, on the left medial ankle, the granulation tissue is a bit hypertrophic. 08/16/2021: Last week, I took a culture and this grew back rare methicillin-resistant Staph aureus and rare corynebacterium. The MRSA was sensitive to gentamicin which we began applying topically on an empiric basis. This week, her wounds are a bit smaller and the drainage and odor are less. Her primary  care provider is working on assisting the patient with a dermatology evaluation. She has been in silver alginate over the gentamicin that was started last week along with Kerlix and Coban wraps. 08/23/2021: Because she has Medicaid, we have been unable to get her into see any dermatologist in the [place] to rule out pyoderma gangrenosum, which was a requirement from plastic surgery prior to any sort of debridement and grafting. Despite this, however, all of her wounds continue to get smaller. The wound on her left medial ankle is nearly closed. There is no odor from the wounds, although she still accumulates a modest amount of drainage on her dressings. 08/30/2021: The lateral right ankle wound and the medial left ankle wound are a bit smaller today. The medial right ankle wound is about the same size. They are less tender. We have still been unable to get her into dermatology. 09/06/2021: All of the wounds are about the same size today. She continues to endorse minimal pain. I communicated with Dr. Arita Miss in plastic surgery regarding our issues getting a dermatology appointment; he was out of town but indicated that he would look into perhaps performing the biopsy in his office and will have his office contact her. 09/14/2021: The patient has an appointment in dermatology, but it is not until October. Her wounds are roughly the same; she continues to have very thick purulent-looking drainage on her dressings. 09/20/2021: The left medial wound is nearly closed and just has a bit of accumulated eschar on the surface. The right medial and lateral ankle wounds are perhaps a little bit smaller. They continue to have a very pale surface with accumulation of thin slough. 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 I did not think Jodie Echevaria was indicated based on this. She is getting topical mupirocin with Prisma silver collagen. HILARIA, TENESACA ([id number])  [id number] Page 15 of 22 10/04/2021: The patient was not seen in clinic last week due to childcare coverage issues. In the interim, the left medial leg wound has closed. The right sided leg wounds are smaller. There is more granulation tissue coming through, particularly on the lateral wound. The surface remains somewhat gritty. We have been applying topical mupirocin and Prisma silver collagen. 10/11/2021: The left medial leg  wound remains closed. She does complain of some anesthetic sensation to the area. Both of the right-sided leg wounds are smaller but still have accumulated slough. 10/18/2021: Both right-sided leg wounds are minimally smaller this week. She still continues to accumulate slough and has thick drainage on her dressings. 10/23/2021: Both wounds continue to contract. There is still slough buildup. She has been approved for a keratin-based skin substitute trial product but it will not be available until next week. 10/30/2021: The wounds are about the same to perhaps slightly smaller. There is still continued slough buildup. Unfortunately, the rep for the keratin based product did not show up today and did not answer his phone when called. 11/08/2021: The wounds are little bit smaller today. She continues to have thick drainage but the surfaces are relatively clean with just a little bit of slough accumulation. She reported to me today that she is unable to completely flex her left ankle and on examination it seems this is potentially related to scar tissue from her wounds. We do have the ProgenaMatrix trial product available for her today. 11/15/2021: Both wounds are smaller today. There is some slough accumulation on the surfaces, but the medial wound, in particular looks like it is filling in and is less deep. She did hear from physical therapy and she is going to start working with them on July 11. She is here for her second application of the trial  skin substitute, ProgenaMatrix. 11/22/2021: Both wounds continue to contract, the medial more dramatically than the lateral. Both wounds have a layer of slough on the surface, but underneath this, the gritty fibrous tissue has a little bit more of a pink cast to it rather than being as pale as it has been. 11/29/2021: The wounds are roughly the same size this week, perhaps a millimeter or 2 smaller. The medial wound has filled in and is nearly flush with the surrounding skin surface. She continues to have a lot of slough accumulation on both surfaces. 12/06/2021: No significant change to her wounds, but she has a new opening on her dorsal foot, just distal to the right lateral ankle wound. The area on her left medial ankle that reopened looks a little bit larger today. She has quite a bit of pain associated with the new wound. 12/12/2021: Her wounds look about the same but the new opening on her right lateral dorsal foot is a little bit bigger. She continues to have a fair amount of pain with this wound. 12/26/2021: The left medial ankle wound is tiny and superficial. She has 2 areas of crusting on her left lateral ankle, however, that appear to be threatening to open again. Her right medial ankle wound is a little bit smaller today but still continues to accumulate thick rubbery slough. The new dorsal foot wound is exquisitely painful but there is no odor or purulent drainage. No erythema or induration. The right lateral ankle wound looks about the same today, again with thick rubbery slough. 01/03/2022: The left medial ankle wound has closed again. Both right ankle wounds appear to be about the same size with thick rubbery slough. The dorsal foot wound on the right continues to be quite painful and she stated that she did not want any debridement of that site today. 01/10/2022: No real change to any of her wounds. She continues to accumulate thick slough. The dorsal foot wound has merged with the lateral  malleolar wound. She is experiencing significant pain in the dorsal foot portion of  the ulcer. 01/16/2022: Absolutely no change or progress in her wounds. 01/24/2022: Her wounds are unchanged. She continues to build up slough and the wounds on her dorsal right foot are still exquisitely tender. 01/31/2022: The wounds actually measure a little bit narrower today. They still have thick slough on the surface but the underlying tissue seems a little less fibrotic. We changed to Iodosorb last week. 02/07/2022: Wounds continue to slowly epithelialize around the parameters. She has less pain today. She still accumulates a fairly substantial layer of slough. 02/15/2022: No change to the wounds overall. The measured a little bit larger today per the intake nurse. She continues to have substantial slough accumulation and her pain is a little bit worse. 02/21/2022. No change at all to her wounds. I do not see that plastic surgery has received her referral yet. 02/28/2022: The wounds remain unchanged. They are dry and fibrotic with accumulation of slough and eschar. She did receive an appointment to see plastic surgery on October 23, but the office called her back and indicated they needed to reschedule and that the next available appointment was not until December. The patient became angry and decided she did not want to see this plastics group. 10/19; the patient sees Dr. Maylene Roes again next week. She is using Medihoney as the primary dressing changing this herself. 03/21/2022: She saw Dr. Ferd Hibbs at the wound care center at [facility]. Dr. Ferd Hibbs was also unable to debride her, secondary to pain and is planning to take her to the operating room for operative debridement and potential skin substitute placement. Her wounds are unchanged with thick yellow slough and a fibrotic base. She says they are too painful to be debrided. In addition, yesterday her hemoglobin was 4 and she received 2 units  packed red blood cell transfusion. 04/04/2022: Her operation at [facility] is scheduled for December 15. She continues to use Medihoney on her wounds. They are a little bit less painful today and she is willing to entertain the possibility of debridement. The wounds measured a little bit larger in all dimensions today but the layer of slough is not as thick as usual. 04/18/2022: Her wounds actually measured a little bit smaller today and the extension onto the dorsal foot from the lateral wound has healed. They are less tender and there is significantly less slough present as compared to prior visits. 05/09/2022: She had to reschedule her surgery due to lack of childcare. It is now planned for January 5. The wounds are basically unchanged, but she had a lot more drainage, which was thick and somewhat purulent in appearance. No significant odor. 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 They are quite painful today and she does not want to have debridement performed. 05/30/2022: The patient underwent surgical debridement and placement of TheraSkin to her wounds on January 5. She has been doing well and does not endorse any pain. The TheraSkin is intact and looks beautiful. It is sutured in place. There is no exudate or significant drainage. 06/04/2022: Her TheraSkin remains intact and I am starting to see granulation tissue emerging underneath the surface. No concern for infection. 06/12/2022: The TheraSkin is intact and there is more granulation tissue emerging. It is starting to look a little bit dry, however. No malodor or purulent drainage. 06/19/2022: The TheraSkin continues to look good. The edges are a bit dry, but the central portion of each of her wounds is showing a nice pink color. 06/27/2022: The TheraSkin on  the lateral wound remains almost completely intact. There is nice pink tissue peaking through the mesh of the TheraSkin. On the TAUNDRA, LEGLEITER ([id number])  [id number] Page 16 of 22 medial leg, it is starting to breakdown a little bit, but most of the TheraSkin remains intact here, as well. Both wounds measured smaller today. 07/03/2022: The wounds are fairly dry around the perimeter today. Some of the suture is hanging loose. 07/12/2022: Both wounds are measuring smaller today. The medial ulcer is getting a bit too dry. The lateral ulcer continues to have nice buds of granulation tissue emerging. 07/26/2022: The moisture balance on both wounds is much better today. For the first time since I began seeing her, I see actual beefy red granulation tissue on the lateral ankle wound. There are more buds of deep pink granulation tissue emerging on the medial ankle. There is some dry eschar around the edges of the medial wound and a tiny amount of slough overlying the good granulation tissue on the lateral wound. 08/09/2022: Unfortunately, she has a new wound on her left lateral lower leg, just above the ankle. It is extremely painful for her. It penetrates to the fat layer and has the same woody, fibrotic character as her previous wounds. The other wounds are both a little bit smaller with slough and some eschar around the periphery. 08/14/2022: Her right lateral ankle wound is a little bit smaller. Unfortunately, she has had an increase in her drainage and it has a purulent nature to it, similar to what was present prior to her surgical debridement. She also reports odor coming from her wounds this week. The left lateral leg wound remains extremely tender. 08/21/2022: The left lateral ankle wound is larger and more painful today. Both right ankle wounds have a little bit more vital appearance, but they have accumulated more slough. She reports less odor this week. She is currently taking Augmentin. 08/28/2022: The left lateral ankle wound continues to enlarge and remains quite painful. Both of the right ankle wounds actually look better  this week. There is improved tissue quality with less slough accumulation. 09/27/2022: The left lateral ankle wound actually looks nearly healed. She says that she has been applying some silver alginate that she had leftover. She was unable to afford the Santyl that was prescribed, but found a tube that she had on hand at home and has been using this on her right ankle wounds. These wounds look about the same and have accumulated the usual layer of slough. Unfortunately, a portion of the dorsal foot wound has reopened and is exquisitely painful today. 10/09/2022: The left lateral ankle wound is healed. The dorsal foot wound appears to have expanded and remains exquisitely painful. The medial and lateral right ankle wounds look about the same size, but they are quite a bit cleaner. 10/23/2022: The left lateral ankle remains closed. The dorsal foot wound has expanded to the point that it now converges with the right lateral leg wound. She has accumulated fairly thick slough on all of the open surfaces. 11/06/2022: No significant change to any of her wounds, although the dorsal foot aspect of the right lateral wound is a little bit smaller. She has been approved for the [employer] and [employer] for [employer] and will be able to get a full year's supply. 11/20/2022: The dorsal foot aspect of the right lateral wound has almost completely closed. I am seeing nice pink tissue starting to emerge from the fibrotic surface of both wounds. The medial  leg wound appears to be a little bit smaller today. She has been using Santyl. 12/04/2022: The dorsal foot aspect of the right lateral wound has closed. There is more pink granulation tissue emerging on her wounds. There are buds of epithelium beginning to fill in from the edges of her wound, particularly at the proximal aspect of the medial wound and the distal aspect of the lateral wound. Significantly less slough formation than in the past. 12/18/2022: The  wounds measured about the same size today, but there is better granulation tissue filling in and the overall surface consistency is improving. She does have slough accumulation on the surfaces, but no malodor or purulent drainage. 01/01/2023: No significant change to her wounds. The surface seems a little bit more fibrotic today. She is complaining of more pain. 01/15/2023: Her wounds are stable today. She has been taking Augmentin based on the culture that I took at her previous visit. She says that she has felt physically better since being on the antibiotic and has had less drainage from her wounds. 01/29/2023: The wounds are unchanged and remained fibrotic and painful. She does report having less pain while on antibiotics and less drainage, as well. 02/07/2023: Her wounds have gotten larger and more painful. She completed her course of antibiotics yesterday. She still has a fair amount of drainage coming from her wounds. 02/24/2023: Her wounds continue to enlarge. They are more painful. 03/18/2023: Her wounds continue to enlarge and become more painful. The systemic antibiotics do not seem to have made any difference this round. She is having more drainage, as well. 04/01/2023: Her wounds are about the same size today. They have a thick layer of slough on both the medial and lateral aspect. Underneath, however, the tissue is not quite as pale as it usually appears. She does have 3 new wounds on her left lower leg, 1 lateral and 2 medial. They are covered with slough and eschar and are exquisitely painful. She has been applying silver alginate to these at home. Her case was discussed in the [facility] conference recently. Several suggestions were made including Lidoderm patches, vascular referral for an angiogram, and potentially further surgery. They also suggested getting a TCOM done with the Lidoderm patch in situ. They agreed with the decision to put her on pentoxifylline. 04/14/2023: Her  wounds have worsened across the board. All of them are larger and exquisitely painful. Her insurance would not cover the Lidoderm patches. She has an appointment with vascular surgery coming up next week. Patient History Information obtained from Patient. Family History Diabetes - Mother, Lung Disease - Mother, No family history of Cancer, Heart Disease, Hereditary Spherocytosis, Hypertension, Kidney Disease, Seizures, Stroke, Thyroid Problems, Tuberculosis. Social History Never smoker, Marital Status - Married, Alcohol Use - Never, Drug Use - No History, Caffeine Use - Daily. Medical History Eyes Denies history of Cataracts, Glaucoma, Optic Neuritis Ear/Nose/Mouth/Throat Denies history of Chronic sinus problems/congestion, Middle ear problems Hematologic/Lymphatic Patient has history of Anemia, Sickle Cell Disease Denies history of Hemophilia, Human Immunodeficiency Virus, Lymphedema Hursey, Felecia Shelling ([id number]) [id number] Page 17 of 22 Respiratory Denies history of Aspiration, Asthma, Chronic Obstructive Pulmonary Disease (COPD), Pneumothorax, Sleep Apnea, Tuberculosis Cardiovascular Denies history of Angina, Arrhythmia, Congestive Heart Failure, Coronary Artery Disease, Deep Vein Thrombosis, Hypertension, Hypotension, Myocardial Infarction, Peripheral Arterial Disease, Peripheral Venous Disease, Phlebitis, Vasculitis Gastrointestinal Denies history of Cirrhosis , Colitis, Crohns, Hepatitis A, Hepatitis B, Hepatitis C Endocrine Denies history of Type I Diabetes, Type II Diabetes Genitourinary Denies history of  End Stage Renal Disease Immunological Denies history of Lupus Erythematosus, Raynauds, Scleroderma Integumentary (Skin) Denies history of History of Burn Musculoskeletal Denies history of Gout, Rheumatoid Arthritis, Osteoarthritis, Osteomyelitis Neurologic Patient has history of Neuropathy - right foot intermittant Denies history of Dementia,  Quadriplegia, Paraplegia, Seizure Disorder Oncologic Denies history of Received Chemotherapy, Received Radiation Psychiatric Denies history of Anorexia/bulimia, Confinement Anxiety Hospitalization/Surgery History - c section x2. - left breast lumpectomy. - iandD right ankle with theraskin. Medical A Surgical History Notes nd Constitutional Symptoms (General Health) H/O miscarriage Cardiovascular bradycardia Gastrointestinal cholilithiasis Objective Constitutional Hypertensive, asymptomatic. She is crying and quite upset today. Vitals Time Taken: 10:19 AM, Height: 67 in, Weight: 134 lbs, BMI: 21, Temperature: 98.2 F, Pulse: 80 bpm, Respiratory Rate: 18 breaths/min, Blood Pressure: 156/80 mmHg. Respiratory Normal work of breathing on room air.. General Notes: 04/14/2023: All of her wounds have gotten larger today. They are exquisitely painful, such that I can barely touch them, even to wipe away the topical lidocaine was applied. There is slough that has accumulated on all of the surfaces there is some increased pink coloration to the tissue, but overall it remains quite fibrotic and pale. Integumentary (Hair, Skin) Wound #17 status is Open. Original cause of wound was Gradually Appeared. The date acquired was: 10/05/2012. The wound has been in treatment 103 weeks. The wound is located on the Right,Lateral Lower Leg. The wound measures 10.3cm length x 7.8cm width x 0.2cm depth; 63.099cm^2 area and 12.62cm^3 volume. There is Fat Layer (Subcutaneous Tissue) exposed. There is no tunneling or undermining noted. There is a large amount of serosanguineous drainage noted. The wound margin is distinct with the outline attached to the wound base. There is medium (34-66%) pink granulation within the wound bed. There is a medium (34-66%) amount of necrotic tissue within the wound bed including Adherent Slough. The periwound skin appearance had no abnormalities noted for moisture. The periwound skin  appearance exhibited: Scarring, Hemosiderin Staining. The periwound skin appearance did not exhibit: Ecchymosis. Periwound temperature was noted as No Abnormality. The periwound has tenderness on palpation. Wound #21 status is Open. Original cause of wound was Gradually Appeared. The date acquired was: 06/26/2021. The wound has been in treatment 93 weeks. The wound is located on the Right,Medial Ankle. The wound measures 13.4cm length x 6cm width x 0.2cm depth; 63.146cm^2 area and 12.629cm^3 volume. There is Fat Layer (Subcutaneous Tissue) exposed. There is no tunneling or undermining noted. There is a large amount of serosanguineous drainage noted. The wound margin is distinct with the outline attached to the wound base. There is small (1-33%) pink granulation within the wound bed. There is a large (67- 100%) amount of necrotic tissue within the wound bed including Adherent Slough. The periwound skin appearance had no abnormalities noted for moisture. The periwound skin appearance had no abnormalities noted for color. The periwound skin appearance exhibited: Scarring. Periwound temperature was noted as No Abnormality. The periwound has tenderness on palpation. Wound #26 status is Open. Original cause of wound was Gradually Appeared. The date acquired was: 03/18/2023. The wound has been in treatment 1 weeks. The wound is located on the Left,Lateral Lower Leg. The wound measures 5.1cm length x 3cm width x 0.1cm depth; 12.017cm^2 area and 1.202cm^3 volume. There is Fat Layer (Subcutaneous Tissue) exposed. There is no tunneling or undermining noted. There is a medium amount of serous drainage noted. The wound margin is distinct with the outline attached to the wound base. There is no granulation within the wound bed.  There is a large (67-100%) amount of necrotic tissue within the wound bed including Adherent Slough. The periwound skin appearance had no abnormalities noted for moisture. The periwound  skin appearance had no abnormalities noted for color. The periwound skin appearance exhibited: Scarring. Periwound temperature was noted as No Abnormality. The periwound has tenderness on palpation. Wound #27 status is Open. Original cause of wound was Gradually Appeared. The date acquired was: 03/18/2023. The wound has been in treatment 1 weeks. The wound is located on the Left,Medial Lower Leg. The wound measures 2.8cm length x 3.8cm width x 0.1cm depth; 8.357cm^2 area and 0.836cm^3 volume. There is Fat Layer (Subcutaneous Tissue) exposed. There is no tunneling or undermining noted. There is a medium amount of serous drainage noted. The wound margin is distinct with the outline attached to the wound base. There is no granulation within the wound bed. There is a large (67-100%) amount of necrotic tissue within the wound bed including Adherent Slough. The periwound skin appearance had no abnormalities noted for moisture. The periwound skin [name], MANCINE ([id number]) [id number] Page 18 of 22 appearance had no abnormalities noted for color. The periwound skin appearance exhibited: Scarring. Periwound temperature was noted as No Abnormality. The periwound has tenderness on palpation. Wound #28 status is Open. Original cause of wound was Gradually Appeared. The date acquired was: 03/18/2023. The wound has been in treatment 1 weeks. The wound is located on the Left Achilles. The wound measures 1.5cm length x 1.4cm width x 0.1cm depth; 1.649cm^2 area and 0.165cm^3 volume. There is Fat Layer (Subcutaneous Tissue) exposed. There is no tunneling or undermining noted. There is a medium amount of serous drainage noted. The wound margin is distinct with the outline attached to the wound base. There is small (1-33%) pink granulation within the wound bed. There is a large (67-100%) amount of necrotic tissue within the wound bed including Adherent Slough. The periwound skin appearance  had no abnormalities noted for texture. The periwound skin appearance had no abnormalities noted for moisture. The periwound skin appearance had no abnormalities noted for color. Periwound temperature was noted as No Abnormality. The periwound has tenderness on palpation. 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 There was a Chemical/Enzymatic/Mechanical debridement performed by Zenaida Deed, RN. after achieving pain control using Lidocaine 4% T opical Solution. Agent used was [employer]. There was no bleeding. The procedure was tolerated well. Post Debridement Measurements: 10.3cm length x 7.8cm width x 0.2cm depth; 12.62cm^3 volume. Character of Wound/Ulcer Post Debridement requires further debridement. Post procedure Diagnosis Wound #17: Same as Pre-Procedure Wound #21 Pre-procedure diagnosis of Wound #21 is a Sickle Cell Lesion located on the Right,Medial Ankle .Severity of Tissue Pre Debridement is: Fat layer exposed. There was a Chemical/Enzymatic/Mechanical debridement performed by Zenaida Deed, RN. after achieving pain control using Lidocaine 4% T opical Solution. Agent used was [employer]. There was no bleeding. The procedure was tolerated well. Post Debridement Measurements: 13.4cm length x 6cm width x 0.1cm depth; 6.315cm^3 volume. Character of Wound/Ulcer Post Debridement requires further debridement. Severity of Tissue Post Debridement is: Fat layer exposed. Post procedure Diagnosis Wound #21: Same as Pre-Procedure Wound #26 Pre-procedure diagnosis of Wound #26 is a Sickle Cell Lesion located on the Left,Lateral Lower  Leg . There was a  Chemical/Enzymatic/Mechanical debridement performed by Zenaida Deed, RN. after achieving pain control using Lidocaine 4% Topical Solution. Agent used was [employer]. There was no bleeding. The procedure was tolerated well. Post Debridement Measurements: 5.1cm length x 3cm width x 0.1cm depth; 1.202cm^3 volume. Character of Wound/Ulcer Post Debridement requires further debridement. Post procedure Diagnosis Wound #26: Same as Pre-Procedure Wound #27 Pre-procedure diagnosis of Wound #27 is a Sickle Cell Lesion located on the Left,Medial Lower Leg . There was a Chemical/Enzymatic/Mechanical debridement performed by Zenaida Deed, RN. after achieving pain control using Lidocaine 4% Topical Solution. Agent used was [employer]. There was no bleeding. The procedure was tolerated well. Post Debridement Measurements: 2.8cm length x 3.8cm width x 0.1cm depth; 0.836cm^3 volume. Character of Wound/Ulcer Post Debridement requires further debridement. Post procedure Diagnosis Wound #27: Same as Pre-Procedure Wound #28 Pre-procedure diagnosis of Wound #28 is a Sickle Cell Lesion located on the Left Achilles . There was a Chemical/Enzymatic/Mechanical debridement performed by Zenaida Deed, RN. after achieving pain control using Lidocaine 4% Topical Solution. Agent used was [employer]. There was no bleeding. The procedure was tolerated well. Post Debridement Measurements: 1.5cm length x 1.4cm width x 0.1cm depth; 0.165cm^3 volume. Character of Wound/Ulcer Post Debridement requires further debridement. Post procedure Diagnosis Wound #28: Same as Pre-Procedure Plan Follow-up Appointments: Return appointment in 3 weeks. - Dr. Lady Gary Anesthetic: (In clinic) Topical Lidocaine 4% applied to wound bed Bathing/ Shower/ Hygiene: May shower and wash wound with soap and water. Additional Orders / Instructions: Follow Nutritious Diet Juven Shake 1-2 times daily. Lymphedema Treatment Plan - Exercise, Compression and  Elevation: Elevate legs 30 - 60 minutes at or above heart level at least 3 - 4 times daily as able/tolerated [name] ([id number]) [id number] Page 19 of 22 Avoid standing for long periods and elevate leg(s) parallel to the floor when sitting WOUND #17: - Lower Leg Wound Laterality: Right, Lateral Cleanser: Soap and Water Every Other Day/30 Days Discharge Instructions: May shower and wash wound with dial antibacterial soap and water prior to dressing change. Cleanser: Wound Cleanser Every Other Day/30 Days Discharge Instructions: Cleanse the wound with wound cleanser prior to applying a clean dressing using gauze sponges, not tissue or cotton balls. Peri-Wound Care: Triamcinolone 15 (g) Every Other Day/30 Days Discharge Instructions: Use triamcinolone 15 (g) as directed Peri-Wound Care: Sween Lotion (Moisturizing lotion) Every Other Day/30 Days Discharge Instructions: Apply moisturizing lotion as directed Topical: Gentamicin Every Other Day/30 Days Discharge Instructions: As directed by physician Topical: Mupirocin Ointment Every Other Day/30 Days Discharge Instructions: Apply Mupirocin (Bactroban) as instructed Prim Dressing: Santyl Ointment Every Other Day/30 Days ary Discharge Instructions: Apply nickel thick amount to wound bed as instructed Secondary Dressing: ABD Pad, 5x9 Every Other Day/30 Days Discharge Instructions: Apply over primary dressing as directed. Secondary Dressing: Woven Gauze Sponge, Non-Sterile 4x4 in Every Other Day/30 Days Discharge Instructions: Moisten with normal saline and Apply over primary dressing as directed. Secured With: Elastic Bandage 4 inch (ACE bandage) Every Other Day/30 Days Discharge Instructions: Secure with ACE bandage as directed. Secured With: [employer], 4.5x3.1 (in/yd) Every Other Day/30 Days Discharge Instructions: Secure with Kerlix as directed. WOUND #21: - Ankle Wound Laterality: Right,  Medial Cleanser: Soap and Water Every Other Day/30 Days Discharge Instructions: May shower and wash wound with dial antibacterial soap and water prior to dressing change. Cleanser: Wound Cleanser Every Other Day/30 Days Discharge Instructions: Cleanse the wound with wound cleanser prior to applying a clean dressing using gauze sponges,  not tissue or cotton balls. Peri-Wound Care: Triamcinolone 15 (g) Every Other Day/30 Days Discharge Instructions: Use triamcinolone 15 (g) as directed Peri-Wound Care: Sween Lotion (Moisturizing lotion) Every Other Day/30 Days Discharge Instructions: Apply moisturizing lotion as directed Topical: Gentamicin Every Other Day/30 Days Discharge Instructions: As directed by physician Topical: Mupirocin Ointment Every Other Day/30 Days Discharge Instructions: Apply Mupirocin (Bactroban) as instructed Prim Dressing: Santyl Ointment Every Other Day/30 Days ary Discharge Instructions: Apply nickel thick amount to wound bed as instructed Secondary Dressing: ABD Pad, 5x9 Every Other Day/30 Days Discharge Instructions: Apply over primary dressing as directed. Secondary Dressing: Woven Gauze Sponge, Non-Sterile 4x4 in Every Other Day/30 Days Discharge Instructions: Moisten with normal saline and Apply over primary dressing as directed. Secured With: Elastic Bandage 4 inch (ACE bandage) Every Other Day/30 Days Discharge Instructions: Secure with ACE bandage as directed. Secured With: [employer], 4.5x3.1 (in/yd) Every Other Day/30 Days Discharge Instructions: Secure with Kerlix as directed. WOUND #26: - Lower Leg Wound Laterality: Left, Lateral Cleanser: Soap and Water Every Other Day/30 Days Discharge Instructions: May shower and wash wound with dial antibacterial soap and water prior to dressing change. Cleanser: Wound Cleanser Every Other Day/30 Days Discharge Instructions: Cleanse the wound with wound cleanser prior to applying a clean dressing using gauze  sponges, not tissue or cotton balls. Peri-Wound Care: Triamcinolone 15 (g) Every Other Day/30 Days Discharge Instructions: Use triamcinolone 15 (g) as directed Peri-Wound Care: Sween Lotion (Moisturizing lotion) Every Other Day/30 Days Discharge Instructions: Apply moisturizing lotion as directed Topical: Gentamicin Every Other Day/30 Days Discharge Instructions: As directed by physician Topical: Mupirocin Ointment Every Other Day/30 Days Discharge Instructions: Apply Mupirocin (Bactroban) as instructed Prim Dressing: Maxorb Extra Ag+ Alginate Dressing, 4x4.75 (in/in) Every Other Day/30 Days ary Discharge Instructions: Apply to wound bed as instructed Prim Dressing: Santyl Ointment Every Other Day/30 Days ary Discharge Instructions: Apply nickel thick amount to wound bed as instructed Secondary Dressing: ABD Pad, 5x9 Every Other Day/30 Days Discharge Instructions: Apply over primary dressing as directed. Secondary Dressing: Woven Gauze Sponge, Non-Sterile 4x4 in Every Other Day/30 Days Discharge Instructions: Moisten with normal saline and Apply over primary dressing as directed. Secured With: Elastic Bandage 4 inch (ACE bandage) Every Other Day/30 Days Discharge Instructions: Secure with ACE bandage as directed. Secured With: [employer], 4.5x3.1 (in/yd) Every Other Day/30 Days Discharge Instructions: Secure with Kerlix as directed. WOUND #27: - Lower Leg Wound Laterality: Left, Medial Cleanser: Soap and Water Every Other Day/30 Days Discharge Instructions: May shower and wash wound with dial antibacterial soap and water prior to dressing change. Cleanser: Wound Cleanser Every Other Day/30 Days Discharge Instructions: Cleanse the wound with wound cleanser prior to applying a clean dressing using gauze sponges, not tissue or cotton balls. Peri-Wound Care: Triamcinolone 15 (g) Every Other Day/30 Days Discharge Instructions: Use triamcinolone 15 (g) as directed Peri-Wound Care:  Sween Lotion (Moisturizing lotion) Every Other Day/30 Days Discharge Instructions: Apply moisturizing lotion as directed Topical: Gentamicin Every Other Day/30 Days Discharge Instructions: As directed by physician Topical: Mupirocin Ointment Every Other Day/30 Days Discharge Instructions: Apply Mupirocin (Bactroban) as instructed Prim Dressing: Maxorb Extra Ag+ Alginate Dressing, 4x4.75 (in/in) Every Other Day/30 Days ary Discharge Instructions: Apply to wound bed as instructed Prim Dressing: Santyl Ointment Every Other Day/30 Days ary Discharge Instructions: Apply nickel thick amount to wound bed as instructed Secondary Dressing: ABD Pad, 5x9 Every Other Day/30 Days Discharge Instructions: Apply over primary dressing as directed. Secondary Dressing: Woven Gauze Sponge, Non-Sterile  4x4 in Every Other Day/30 Days JOZELYN, CAPEHART ([id number]) [id number] Page 20 of 22 Discharge Instructions: Moisten with normal saline and Apply over primary dressing as directed. Secured With: Elastic Bandage 4 inch (ACE bandage) Every Other Day/30 Days Discharge Instructions: Secure with ACE bandage as directed. Secured With: [employer], 4.5x3.1 (in/yd) Every Other Day/30 Days Discharge Instructions: Secure with Kerlix as directed. WOUND #28: - Achilles Wound Laterality: Left Cleanser: Soap and Water Every Other Day/30 Days Discharge Instructions: May shower and wash wound with dial antibacterial soap and water prior to dressing change. Cleanser: Wound Cleanser Every Other Day/30 Days Discharge Instructions: Cleanse the wound with wound cleanser prior to applying a clean dressing using gauze sponges, not tissue or cotton balls. Peri-Wound Care: Triamcinolone 15 (g) Every Other Day/30 Days Discharge Instructions: Use triamcinolone 15 (g) as directed Peri-Wound Care: Sween Lotion (Moisturizing lotion) Every Other Day/30 Days Discharge Instructions: Apply moisturizing  lotion as directed Topical: Gentamicin Every Other Day/30 Days Discharge Instructions: As directed by physician Topical: Mupirocin Ointment Every Other Day/30 Days Discharge Instructions: Apply Mupirocin (Bactroban) as instructed Prim Dressing: Maxorb Extra Ag+ Alginate Dressing, 4x4.75 (in/in) Every Other Day/30 Days ary Discharge Instructions: Apply to wound bed as instructed Prim Dressing: Santyl Ointment Every Other Day/30 Days ary Discharge Instructions: Apply nickel thick amount to wound bed as instructed Secondary Dressing: ABD Pad, 5x9 Every Other Day/30 Days Discharge Instructions: Apply over primary dressing as directed. Secondary Dressing: Woven Gauze Sponge, Non-Sterile 4x4 in Every Other Day/30 Days Discharge Instructions: Moisten with normal saline and Apply over primary dressing as directed. Secured With: Elastic Bandage 4 inch (ACE bandage) Every Other Day/30 Days Discharge Instructions: Secure with ACE bandage as directed. Secured With: [employer], 4.5x3.1 (in/yd) Every Other Day/30 Days Discharge Instructions: Secure with Kerlix as directed. 04/14/2023: All of her wounds have gotten larger today. They are exquisitely painful, such that I can barely touch them, even to wipe away the topical lidocaine was applied. There is slough that has accumulated on all of the surfaces there is some increased pink coloration to the tissue, but overall it remains quite fibrotic and pale. She would not tolerate debridement today so this was deferred. We will continue the topical gentamicin and mupirocin along with Santyl. She continues to take pentoxifylline, which may be responsible for the pain her coloration to some of the tissue. As Lidoderm patches were not approved, I am going to try nitroglycerin patches to see if vasodilation will help improve any of the blood flow to this area. She does not want to pursue any surgical intervention as she says she has done everything and  none of it worked. I am afraid I have very few options available to benefit her. She will follow-up in 2 weeks. Electronic Signature(s) Signed: 04/14/2023 12:19:03 PM By: Duanne Guess MD FACS Signed: 04/14/2023 4:25:54 PM By: Zenaida Deed RN, BSN Previous Signature: 04/14/2023 11:23:00 AM Version By: Duanne Guess MD FACS Previous Signature: 04/14/2023 11:12:16 AM Version By: Duanne Guess MD FACS Entered By: Zenaida Deed on 04/14/2023 08:26:46 -------------------------------------------------------------------------------- HxROS Details Patient Name: Date of Service: [name], [name] 04/14/2023 10:30 A M Medical Record Number: [id number] Patient Account Number: [id number] Date of Birth/Sex: Treating RN: 12/28/1970 (52 y.o. F) Primary Care Provider: Julianne Handler Other Clinician: Referring Provider: Treating Provider/Extender: Koleen Nimrod Weeks in Treatment: 103 Information Obtained From Patient Constitutional Symptoms (General Health) Medical History: Past Medical History Notes: H/O miscarriage Eyes Medical History: Negative for: Cataracts; Glaucoma; Optic  Neuritis AZA, KOPKA ([id number]) [id number] Page 21 of 22 Ear/Nose/Mouth/Throat Medical History: Negative for: Chronic sinus problems/congestion; Middle ear problems Hematologic/Lymphatic Medical History: Positive for: Anemia; Sickle Cell Disease Negative for: Hemophilia; Human Immunodeficiency Virus; Lymphedema Respiratory Medical History: Negative for: Aspiration; Asthma; Chronic Obstructive Pulmonary Disease (COPD); Pneumothorax; Sleep Apnea; Tuberculosis Cardiovascular Medical History: Negative for: Angina; Arrhythmia; Congestive Heart Failure; Coronary Artery Disease; Deep Vein Thrombosis; Hypertension; Hypotension; Myocardial Infarction; Peripheral Arterial Disease; Peripheral Venous Disease; Phlebitis; Vasculitis Past Medical History  Notes: bradycardia Gastrointestinal Medical History: Negative for: Cirrhosis ; Colitis; Crohns; Hepatitis A; Hepatitis B; Hepatitis C Past Medical History Notes: cholilithiasis Endocrine Medical History: Negative for: Type I Diabetes; Type II Diabetes Genitourinary Medical History: Negative for: End Stage Renal Disease Immunological Medical History: Negative for: Lupus Erythematosus; Raynauds; Scleroderma Integumentary (Skin) Medical History: Negative for: History of Burn Musculoskeletal Medical History: Negative for: Gout; Rheumatoid Arthritis; Osteoarthritis; Osteomyelitis Neurologic Medical History: Positive for: Neuropathy - right foot intermittant Negative for: Dementia; Quadriplegia; Paraplegia; Seizure Disorder Oncologic Medical History: Negative for: Received Chemotherapy; Received Radiation Psychiatric Medical History: Negative for: Anorexia/bulimia; Confinement Anxiety Immunizations Pneumococcal Vaccine: Received Pneumococcal Vaccination: No Implantable Devices None [place], Felecia Shelling ([id number]) [id number] Page 22 of 22 Hospitalization / Surgery History Type of Hospitalization/Surgery c section x2 left breast lumpectomy iandD right ankle with theraskin Family and Social History Cancer: No; Diabetes: Yes - Mother; Heart Disease: No; Hereditary Spherocytosis: No; Hypertension: No; Kidney Disease: No; Lung Disease: Yes - Mother; Seizures: No; Stroke: No; Thyroid Problems: No; Tuberculosis: No; Never smoker; Marital Status - Married; Alcohol Use: Never; Drug Use: No History; Caffeine Use: Daily; Financial Concerns: No; Food, Clothing or Shelter Needs: No; Support System Lacking: No; Transportation Concerns: No Electronic Signature(s) Signed: 04/14/2023 12:19:03 PM By: Duanne Guess MD FACS Entered By: Duanne Guess on 04/14/2023 08:08:53 -------------------------------------------------------------------------------- SuperBill  Details Patient Name: Date of Service: [name], [name] 04/14/2023 Medical Record Number: [id number] Patient Account Number: [id number] Date of Birth/Sex: Treating RN: 1970/07/20 (52 y.o. [name] Primary Care Provider: Julianne Handler Other Clinician: Referring Provider: Treating Provider/Extender: Koleen Nimrod Weeks in Treatment: 103 Diagnosis Coding ICD-10 Codes Code Description [phone] Non-pressure chronic ulcer of other part of right lower leg with other specified severity L97.828 Non-pressure chronic ulcer of other part of left lower leg with other specified severity D57.1 Sickle-cell disease without crisis Facility Procedures : CPT4 Code: [id number] Description: [phone] - DEBRIDE W/O ANES NON SELECT Modifier: Quantity: 1 Physician Procedures : CPT4 Code Description Modifier [id number] - WC PHYS LEVEL 4 - EST PT ICD-10 Diagnosis Description L97.818 Non-pressure chronic ulcer of other part of right lower leg with other specified severity L97.828 Non-pressure chronic ulcer of other part of  left lower leg with other specified severity D57.1 Sickle-cell disease without crisis Quantity: 1 Electronic Signature(s) Signed: 04/14/2023 12:19:03 PM By: Duanne Guess MD FACS Signed: 04/14/2023 4:25:54 PM By: Zenaida Deed RN, BSN Previous Signature: 04/14/2023 11:23:16 AM Version By: Duanne Guess MD FACS Entered By: Zenaida Deed on 04/14/2023 08:24:32

## 2023-04-14 NOTE — Telephone Encounter (Signed)
Not a patient under our care, forwarding to PCP.

## 2023-04-15 ENCOUNTER — Other Ambulatory Visit: Payer: Self-pay

## 2023-04-15 DIAGNOSIS — G894 Chronic pain syndrome: Secondary | ICD-10-CM

## 2023-04-15 NOTE — Telephone Encounter (Signed)
Please advise KH 

## 2023-04-15 NOTE — Telephone Encounter (Signed)
Copied from CRM [id number]. Topic: Clinical - Medication Question >> Apr 15, 2023  1:04 PM Mosetta Putt H wrote:  Reason for CRM: Oxycodone HCl 10 MG TABS

## 2023-04-15 NOTE — Telephone Encounter (Signed)
She wanted to know if there were any refills on file.  Pt will be followed up with our other providers who will be taking over pt medications. KH

## 2023-04-16 ENCOUNTER — Telehealth: Payer: Self-pay

## 2023-04-16 ENCOUNTER — Other Ambulatory Visit (HOSPITAL_COMMUNITY): Payer: Self-pay | Admitting: Nurse Practitioner

## 2023-04-16 DIAGNOSIS — G894 Chronic pain syndrome: Secondary | ICD-10-CM

## 2023-04-16 MED ORDER — OXYCODONE HCL 10 MG PO TABS
10.0000 mg | ORAL_TABLET | ORAL | 0 refills | Status: DC
Start: 2023-04-16 — End: 2023-04-28

## 2023-04-16 NOTE — Telephone Encounter (Signed)
Pt was advised [facility]H 

## 2023-04-16 NOTE — Progress Notes (Signed)
Reviewed PDMP substance reporting system prior to prescribing opiate medications. No inconsistencies noted.   Oxycodone HCl 10 MG TABS         Take 1 tablet (10 mg total) by mouth every 4 (four) hours while awake for 15 days., Starting Wed 04/16/2023, Until Thu 05/01/2023, Normal

## 2023-04-16 NOTE — Telephone Encounter (Signed)
Pt advised she is completely out of her pain nexta appt isn't until feb. Please advise [facility]

## 2023-04-23 NOTE — Progress Notes (Signed)
Patient ID: [name], female   DOB: 08/19/1970, 52 y.o.   MRN: [id number]  Reason for Consult: New Patient (Initial Visit)   Referred by Duanne Guess, MD  Subjective:     HPI  [name] is a 52 y.o. female with a history of sickle cell disease who is presenting for bilateral lower extremity wounds.  She reports she has had wounds on the right ankle for 5 or 6 years that she reports sometimes they get smaller but overall have been there the entire time.  The left ankle is also had wounds over the last few months.  She has had these intermittently over the same course of many years but the left ankle wounds do heal and then reopen at times.  She denies significant swelling or varicosities.  She does have pain in the wounds at night but denies any pain in the feet or toes.  She denies calf claudication.  Past Medical History:  Diagnosis Date   Anemia 03/30/2012   Hx of sickle cell disease   CAP (community acquired pneumonia)    2014   Cholelithiasis 03/30/2012   Chronic wound of extremity 04/01/2012   Elevated LFTs    Leg ulcer (HCC) 10/27/2012   Chronic under care of wound clinic   Multiple open wounds of lower extremity    chronic wounds B/LLE   Sickle cell disease (HCC)    Sinus bradycardia by electrocardiogram 04/03/2012   Family History  Problem Relation Age of Onset   Sickle cell anemia Sister    Diabetes Mother    Asthma Mother    Sickle cell trait Mother    Sickle cell trait Father    Hypertension Father    Past Surgical History:  Procedure Laterality Date   ALLOGRAFT APPLICATION Right 02/14/2015   Procedure: SURGICAL PREP FOR GRAFTING RIGHT LOWER EXTREMITY AND APPLICATION OF Marcellus Scott;  Surgeon: Glenna Fellows, MD;  Location: [facility];  Service: Plastics;  Laterality: Right;   APPLICATION OF A-CELL OF EXTREMITY Right 10/09/2015   Procedure: APPLICATION OF Marcellus Scott;  Surgeon: Glenna Fellows, MD;  Location: [facility];   Service: Plastics;  Laterality: Right;   BREAST SURGERY     left breast cyst aspiration   CESAREAN SECTION     CESAREAN SECTION N/A 01/06/2014   Procedure: CESAREAN SECTION;  Surgeon: Allie Bossier, MD;  Location: [facility];  Service: Obstetrics;  Laterality: N/A;   CESAREAN SECTION N/A 05/04/2017   Procedure: CESAREAN SECTION;  Surgeon: Adam Phenix, MD;  [facility]: [facility];  Service: Obstetrics;  Laterality: N/A;   I & D EXTREMITY Right 10/09/2015   Procedure: SURGICAL PREPARATION FOR GRAFTING RIGHT ANKLE AND APPLICATION THERASKIN;  Surgeon: Glenna Fellows, MD;  Location: [facility];  Service: Plastics;  Laterality: Right;   SKIN FULL THICKNESS GRAFT Bilateral 06/17/2016   Procedure: SURGICAL PREP FOR GRAFTING, BILATERAL LOWER EXTREMITIES AND APPLICATION OF Marcellus Scott;  Surgeon: Glenna Fellows, MD;  Location: [facility];  Service: Plastics;  Laterality: Bilateral;   TONSILLECTOMY      Short Social History:  Social History   Tobacco Use   Smoking status: Never    Passive exposure: Never   Smokeless tobacco: Never  Substance Use Topics   Alcohol use: No    No Known Allergies  Current Outpatient Medications  Medication Sig Dispense Refill   folic acid (FOLVITE) 1 MG tablet Take 5 tablets (5 mg total) by mouth daily. (Patient taking differently: Take 1 mg  by mouth daily.) 150 tablet 11   gabapentin (NEURONTIN) 300 MG capsule TAKE 1 CAPSULE(300 MG) BY MOUTH THREE TIMES DAILY 90 capsule 2   ibuprofen (ADVIL) 600 MG tablet TAKE 1 TABLET(600 MG) BY MOUTH EVERY 8 HOURS AS NEEDED 30 tablet 5   Oxycodone HCl 10 MG TABS Take 1 tablet (10 mg total) by mouth every 4 (four) hours while awake for 15 days. 90 tablet 0   polyethylene glycol powder (GLYCOLAX/MIRALAX) 17 GM/SCOOP powder Take 17 g by mouth 2 (two) times daily as needed. (Patient taking differently: Take 17 g by mouth 2 (two) times daily as needed for mild constipation.) 3350 g 1   aspirin EC 81 MG tablet Take 1  tablet (81 mg total) by mouth daily. Swallow whole. (Patient not taking: Reported on 04/01/2023) 30 tablet 12   Deferiprone 500 MG TABS Take 500 mg by mouth daily at 12 noon. (Patient not taking: Reported on 04/01/2023) 90 tablet 2   Nutritional Supplements (JUVEN) POWD Take 1 each by mouth 3 (three) times daily between meals. (Patient not taking: Reported on 04/01/2023) 545 g 6   OXBRYTA 500 MG TABS tablet TAKE 2 TABLETS BY MOUTH EVERY DAY (Patient not taking: Reported on 04/01/2023) 90 tablet 1   Vitamin D, Ergocalciferol, (DRISDOL) 1.25 MG (50000 UNIT) CAPS capsule Take 1 capsule (50,000 Units total) by mouth every 7 (seven) days. (Patient not taking: Reported on 04/01/2023) 5 capsule 2   No current facility-administered medications for this visit.    REVIEW OF SYSTEMS  Negative other than noted in HPI     Objective:  Objective   Vitals:   04/25/23 1023  BP: 122/67  Pulse: 85  Resp: 20  Temp: 98.6 F (37 C)  SpO2: 92%  Weight: 128 lb (58.1 kg)  Height: 5\' 7"  (1.702 m)   Body mass index is 20.05 kg/m.  Physical Exam General: no acute distress Cardiac: hemodynamically stable, nontachycardic Pulm: normal work of breathing Neuro: alert, no focal deficit Extremities: Bilateral ankle dressings clean and dry, no edema, dorsum right foot is discolored but no wounds on feet or toes Vascular:   Right: palpable femoral, DP  Left: palpable femoral, DP   Data: [name] +---------+------------------+-----+-----------+--------+  Right   Rt Pressure (mmHg)IndexWaveform   Comment   +---------+------------------+-----+-----------+--------+  Brachial 129                                         +---------+------------------+-----+-----------+--------+  PTA     156               1.21 multiphasic          +---------+------------------+-----+-----------+--------+  DP      146               1.13 multiphasic           +---------+------------------+-----+-----------+--------+  Great Toe96                0.74 Normal               +---------+------------------+-----+-----------+--------+   +---------+------------------+-----+-----------+-------+  Left    Lt Pressure (mmHg)IndexWaveform   Comment  +---------+------------------+-----+-----------+-------+  Brachial 121                                        +---------+------------------+-----+-----------+-------+  PTA     156               1.21 multiphasic         +---------+------------------+-----+-----------+-------+  DP      141               1.09 multiphasic         +---------+------------------+-----+-----------+-------+  [facility]                0.68 Normal              +---------+------------------+-----+-----------+-------+   Reflux study from January 2023 reviewed Bilateral proximal GSV reflux  Most recent creatinine 0.56     Assessment/Plan:     [name] is a 52 y.o. female with sickle cell anemia and longstanding bilateral chronic ankle wounds.  I explained that given her pulse exam and [name] it is unlikely that she has any arterial insufficiency although given how long the wounds have been present with adequate wound care we should perform further evaluation of her perfusion.  She reported she would not want any procedure unless absolutely necessary.    Will plan for 1 month follow-up with bilateral lower extremity arterial duplex    Recommendations to optimize cardiovascular risk: Abstinence from all tobacco products. Blood glucose control with goal A1c < 7%. Blood pressure control with goal blood pressure < 140/90 mmHg. Lipid reduction therapy with goal LDL-C <100 mg/dL  Aspirin 81mg  PO QD.  Atorvastatin 40-80mg  PO QD (or other "high intensity" statin therapy).     Daria Pastures MD [facility]

## 2023-04-24 ENCOUNTER — Ambulatory Visit (HOSPITAL_COMMUNITY)
Admission: RE | Admit: 2023-04-24 | Discharge: 2023-04-24 | Disposition: A | Discharge status: Home / Self Care | Payer: Medicaid Other | Source: Ambulatory Visit | Attending: Vascular Surgery | Admitting: Vascular Surgery

## 2023-04-24 DIAGNOSIS — S81809A Unspecified open wound, unspecified lower leg, initial encounter: Secondary | ICD-10-CM | POA: Diagnosis present

## 2023-04-24 DIAGNOSIS — L97909 Non-pressure chronic ulcer of unspecified part of unspecified lower leg with unspecified severity: Secondary | ICD-10-CM | POA: Diagnosis not present

## 2023-04-24 LAB — VAS US ABI WITH/WO TBI
Left ABI: 1.21
Right ABI: 1.21

## 2023-04-25 ENCOUNTER — Encounter: Payer: Self-pay | Admitting: Vascular Surgery

## 2023-04-25 ENCOUNTER — Ambulatory Visit (INDEPENDENT_AMBULATORY_CARE_PROVIDER_SITE_OTHER): Payer: Medicaid Other | Admitting: Vascular Surgery

## 2023-04-25 VITALS — BP 122/67 | HR 85 | Temp 98.6°F | Resp 20 | Ht 67.0 in | Wt 128.0 lb

## 2023-04-25 DIAGNOSIS — M79606 Pain in leg, unspecified: Secondary | ICD-10-CM | POA: Diagnosis not present

## 2023-04-25 DIAGNOSIS — S91309A Unspecified open wound, unspecified foot, initial encounter: Secondary | ICD-10-CM

## 2023-04-28 ENCOUNTER — Other Ambulatory Visit: Payer: Self-pay | Admitting: Family Medicine

## 2023-04-28 ENCOUNTER — Other Ambulatory Visit: Payer: Self-pay

## 2023-04-28 DIAGNOSIS — S91309A Unspecified open wound, unspecified foot, initial encounter: Secondary | ICD-10-CM

## 2023-04-28 DIAGNOSIS — G894 Chronic pain syndrome: Secondary | ICD-10-CM

## 2023-04-28 MED ORDER — OXYCODONE HCL 10 MG PO TABS: 10.0000 mg | ORAL_TABLET | ORAL | 0 refills | Status: AC

## 2023-04-28 NOTE — Progress Notes (Signed)
Reviewed PDMP substance reporting system prior to prescribing opiate medications. No inconsistencies noted.     Meds ordered this encounter  Medications   Oxycodone HCl 10 MG TABS    Sig: Take 1 tablet (10 mg total) by mouth every 4 (four) hours while awake for 15 days.    Dispense:  90 tablet    Refill:  0    Order Specific Question:   Supervising Provider    Answer:   Quentin Angst [[id number]]     Nolon Nations  APRN, MSN, FNP-C Patient Care [facility] [address] [place], [place] [address] [phone]

## 2023-04-28 NOTE — Telephone Encounter (Signed)
Copied from CRM [id number]. Topic: Clinical - Medication Refill >> Apr 28, 2023 10:31 AM Thomes Dinning wrote: Most Recent Primary Care Visit:  Provider: Massie Maroon  Department: [facility]  Visit Type: OFFICE VISIT  Date: 04/01/2023  Medication: Oxycodone HCl 10 MG TABS  Has the patient contacted their pharmacy? Yes (Agent: If no, request that the patient contact the pharmacy for the refill. If patient does not wish to contact the pharmacy document the reason why and proceed with request.) (Agent: If yes, when and what did the pharmacy advise?)  Is this the correct pharmacy for this prescription? Yes If no, delete pharmacy and type the correct one.  This is the patient's preferred pharmacy:  [id number], [place] - [address] AT [facility] [address] [place] [address] Phone: [phone] Fax: [phone]  [facility] [address] [place] [address] Phone: [phone] Fax: [phone]  [facility] [address], [place] - [address] [place] [address] Phone: [phone] Fax: [phone]   Has the prescription been filled recently? Yes  Is the patient out of the medication? Yes  Has the patient been seen for an appointment in the last year OR does the patient have an upcoming appointment? Yes  Can we respond through MyChart? No  Agent: Please be advised that Rx refills may take up to 3 business days. We ask that you follow-up with your pharmacy.

## 2023-05-05 ENCOUNTER — Encounter (HOSPITAL_BASED_OUTPATIENT_CLINIC_OR_DEPARTMENT_OTHER): Payer: Medicaid Other | Attending: General Surgery | Admitting: General Surgery

## 2023-05-05 DIAGNOSIS — L97828 Non-pressure chronic ulcer of other part of left lower leg with other specified severity: Secondary | ICD-10-CM | POA: Insufficient documentation

## 2023-05-05 DIAGNOSIS — L97818 Non-pressure chronic ulcer of other part of right lower leg with other specified severity: Secondary | ICD-10-CM | POA: Insufficient documentation

## 2023-05-05 DIAGNOSIS — I10 Essential (primary) hypertension: Secondary | ICD-10-CM | POA: Insufficient documentation

## 2023-05-05 DIAGNOSIS — I82531 Chronic embolism and thrombosis of right popliteal vein: Secondary | ICD-10-CM | POA: Diagnosis not present

## 2023-05-05 DIAGNOSIS — D571 Sickle-cell disease without crisis: Secondary | ICD-10-CM | POA: Diagnosis not present

## 2023-05-05 DIAGNOSIS — I872 Venous insufficiency (chronic) (peripheral): Secondary | ICD-10-CM | POA: Diagnosis not present

## 2023-05-07 NOTE — Progress Notes (Signed)
MARLY, CAMBLIN ([id number]) [id number] Page 1 of 18 Visit Report for 05/05/2023 Arrival Information Details Patient Name: Date of Service: [name] 05/05/2023 11:30 A M Medical Record Number: [id number] Patient Account Number: [id number] Date of Birth/Sex: Treating RN: July 13, 1970 (52 y.o. F) Primary Care Hiawatha Merriott: Julianne Handler Other Clinician: Referring Kalecia Hartney: Treating Elmus Mathes/Extender: Koleen Nimrod Weeks in Treatment: 106 Visit Information History Since Last Visit Added or deleted any medications: No Patient Arrived: Ambulatory Any new allergies or adverse reactions: No Arrival Time: 11:14 Had a fall or experienced change in No Accompanied By: self activities of daily living that may affect Transfer Assistance: None risk of falls: Patient Identification Verified: Yes Signs or symptoms of abuse/neglect since last visito No Secondary Verification Process Completed: Yes Hospitalized since last visit: No Patient Requires Transmission-Based Precautions: No Implantable device outside of the clinic excluding No Patient Has Alerts: No cellular tissue based products placed in the center since last visit: Pain Present Now: Yes Electronic Signature(s) Signed: 05/05/2023 11:36:30 AM By: Dayton Scrape Entered By: Dayton Scrape on 05/05/2023 08:14:57 -------------------------------------------------------------------------------- Clinic Level of Care Assessment Details Patient Name: Date of Service: [name] 05/05/2023 11:30 A M Medical Record Number: [id number] Patient Account Number: [id number] Date of Birth/Sex: Treating RN: 10-Aug-1970 (52 y.o. [name], [name] Primary Care Nohealani Medinger: Julianne Handler Other Clinician: Referring Evva Din: Treating Koreena Joost/Extender: Koleen Nimrod Weeks in Treatment: 106 Clinic Level of Care Assessment Items TOOL 4 Quantity Score X- 1 0 Use when only an EandM  is performed on FOLLOW-UP visit ASSESSMENTS - Nursing Assessment / Reassessment X- 1 10 Reassessment of Co-morbidities (includes updates in patient status) X- 1 5 Reassessment of Adherence to Treatment Plan ASSESSMENTS - Wound and Skin A ssessment / Reassessment []  - 0 Simple Wound Assessment / Reassessment - one wound X- 3 5 Complex Wound Assessment / Reassessment - multiple wounds []  - 0 Dermatologic / Skin Assessment (not related to wound area) ASSESSMENTS - Focused Assessment []  - 0 Circumferential Edema Measurements - multi extremities []  - 0 Nutritional Assessment / Counseling / Intervention []  - 0 Lower Extremity Assessment (monofilament, tuning fork, pulses) [name], [name] ([id number]) [id number] Page 2 of 18 []  - 0 Peripheral Arterial Disease Assessment (using hand held doppler) ASSESSMENTS - Ostomy and/or Continence Assessment and Care []  - 0 Incontinence Assessment and Management []  - 0 Ostomy Care Assessment and Management (repouching, etc.) PROCESS - Coordination of Care []  - 0 Simple Patient / Family Education for ongoing care X- 1 20 Complex (extensive) Patient / Family Education for ongoing care X- 1 10 Staff obtains Chiropractor, Records, T Results / Process Orders est []  - 0 Staff telephones HHA, Nursing Homes / Clarify orders / etc []  - 0 Routine Transfer to another Facility (non-emergent condition) []  - 0 Routine Hospital Admission (non-emergent condition) []  - 0 New Admissions / Manufacturing engineer / Ordering NPWT Apligraf, etc. , []  - 0 Emergency Hospital Admission (emergent condition) []  - 0 Simple Discharge Coordination X- 1 15 Complex (extensive) Discharge Coordination PROCESS - Special Needs []  - 0 Pediatric / Minor Patient Management []  - 0 Isolation Patient Management []  - 0 Hearing / Language / Visual special needs []  - 0 Assessment of Community assistance (transportation, D/C planning, etc.) []  -  0 Additional assistance / Altered mentation []  - 0 Support Surface(s) Assessment (bed, cushion, seat, etc.) INTERVENTIONS - Wound Cleansing / Measurement []  - 0 Simple Wound Cleansing - one wound X- 3 5 Complex Wound Cleansing -  multiple wounds []  - 0 Wound Imaging (photographs - any number of wounds) []  - 0 Wound Tracing (instead of photographs) []  - 0 Simple Wound Measurement - one wound X- 3 5 Complex Wound Measurement - multiple wounds INTERVENTIONS - Wound Dressings []  - 0 Small Wound Dressing one or multiple wounds []  - 0 Medium Wound Dressing one or multiple wounds X- 3 20 Large Wound Dressing one or multiple wounds X- 1 5 Application of Medications - topical []  - 0 Application of Medications - injection INTERVENTIONS - Miscellaneous []  - 0 External ear exam []  - 0 Specimen Collection (cultures, biopsies, blood, body fluids, etc.) []  - 0 Specimen(s) / Culture(s) sent or taken to Lab for analysis []  - 0 Patient Transfer (multiple staff / Nurse, adult / Similar devices) []  - 0 Simple Staple / Suture removal (25 or less) []  - 0 Complex Staple / Suture removal (26 or more) []  - 0 Hypo / Hyperglycemic Management (close monitor of Blood Glucose) []  - 0 Ankle / Brachial Index ([name]) - do not check if billed separately [name] ([id number]) [id number] Page 3 of 18 X- 1 5 Vital Signs Has the patient been seen at the hospital within the last three years: Yes Total Score: 175 Level Of Care: New/Established - Level 5 Electronic Signature(s) Signed: 05/06/2023 4:32:11 PM By: Fonnie Mu RN Entered By: Fonnie Mu on 05/05/2023 09:35:58 -------------------------------------------------------------------------------- Encounter Discharge Information Details Patient Name: Date of Service: [name], [name] 05/05/2023 11:30 A M Medical Record Number: [id number] Patient Account Number: [id number] Date of Birth/Sex: Treating  RN: Jun 13, 1970 (52 y.o. [name], [name] Primary Care Gerome Kokesh: Julianne Handler Other Clinician: Referring Elania Crowl: Treating Woodley Petzold/Extender: Koleen Nimrod Weeks in Treatment: [phone] Encounter Discharge Information Items Post Procedure Vitals Discharge Condition: Stable Temperature (F): 98.7 Ambulatory Status: Ambulatory Pulse (bpm): 74 Discharge Destination: Home Respiratory Rate (breaths/min): 17 Transportation: Private Auto Blood Pressure (mmHg): 120/70 Accompanied By: self Schedule Follow-up Appointment: Yes Clinical Summary of Care: Patient Declined Electronic Signature(s) Signed: 05/05/2023 12:37:00 PM By: Fonnie Mu RN Entered By: Fonnie Mu on 05/05/2023 09:37:00 -------------------------------------------------------------------------------- Lower Extremity Assessment Details Patient Name: Date of Service: [name], [name] 05/05/2023 11:30 A M Medical Record Number: [id number] Patient Account Number: [id number] Date of Birth/Sex: Treating RN: May 12, 1971 (52 y.o. [name], [name] Primary Care Shani Fitch: Julianne Handler Other Clinician: Referring Cadon Raczka: Treating Henri Baumler/Extender: Koleen Nimrod Weeks in Treatment: 106 Edema Assessment Assessed: [Left: No] [Right: No] Edema: [Left: No] [Right: No] Calf Left: Right: Point of Measurement: 33 cm From Medial Instep 27.5 cm 29.5 cm Ankle Left: Right: Point of Measurement: 10 cm From Medial Instep 17.5 cm 21 cm Vascular Assessment Extremity colors, hair growth, and conditions: [name], [name] ([id number]) [Right:[id number] Page 4 of 18] Extremity Color: [Left:Normal] [Right:Normal] Hair Growth on Extremity: [Left:No] [Right:No] Temperature of Extremity: [Left:Warm] [Right:Warm] Capillary Refill: [Left:< 3 seconds] [Right:< 3 seconds] Dependent Rubor: [Left:No No] [Right:No No] Electronic Signature(s) Signed: 05/05/2023 11:28:19  AM By: Fonnie Mu RN Entered By: Fonnie Mu on 05/05/2023 08:28:19 -------------------------------------------------------------------------------- Multi Wound Chart Details Patient Name: Date of Service: [name], [name] 05/05/2023 11:30 A M Medical Record Number: [id number] Patient Account Number: [id number] Date of Birth/Sex: Treating RN: [birth year] (52 y.o. F) Primary Care Elaya Droege: Julianne Handler Other Clinician: Referring Evaline Waltman: Treating Rheba Diamond/Extender: Koleen Nimrod Weeks in Treatment: 106 Vital Signs Height(in): 67 Pulse(bpm): 88 Weight(lbs): 134 Blood Pressure(mmHg): 146/73 Body Mass Index(BMI): 21 Temperature(F): 98.1 Respiratory Rate(breaths/min): 18 [17:Photos:] Right, Lateral Lower  Leg Right, Medial Ankle Left, Lateral Lower Leg Wound Location: Gradually Appeared Gradually Appeared Gradually Appeared Wounding Event: Sickle Cell Lesion Sickle Cell Lesion Sickle Cell Lesion Primary Etiology: N/A Venous Leg Ulcer N/A Secondary Etiology: Anemia, Sickle Cell Disease, Anemia, Sickle Cell Disease, Anemia, Sickle Cell Disease, Comorbid History: Neuropathy Neuropathy Neuropathy 10/05/2012 06/26/2021 03/18/2023 Date Acquired: 106 96 4 Weeks of Treatment: Open Open Open Wound Status: No No No Wound Recurrence: Yes No No Clustered Wound: 10x7.5x0.2 12.9x6x0.2 6.4x5.9x0.1 Measurements L x W x D (cm) 58.905 60.79 29.657 A (cm) : rea 11.781 12.158 2.966 Volume (cm) : 68.70% -109.80% -739.20% % Reduction in Area: 37.30% -319.50% -740.20% % Reduction in Volume: Full Thickness Without Exposed Full Thickness Without Exposed Full Thickness Without Exposed Classification: Support Structures Support Structures Support Structures Large Large Medium Exudate A mount: Serosanguineous Serosanguineous Serous Exudate Type: red, brown red, brown amber Exudate Color: Distinct, outline attached Distinct, outline attached Distinct,  outline attached Wound Margin: Medium (34-66%) Small (1-33%) None Present (0%) Granulation Amount: Pink Pink N/A Granulation Quality: Medium (34-66%) Large (67-100%) Large (67-100%) Necrotic Amount: Adherent Slough Adherent [employer] Necrotic Tissue: Fat Layer (Subcutaneous Tissue): Yes Fat Layer (Subcutaneous Tissue): Yes Fat Layer (Subcutaneous Tissue): Yes Exposed Structures: Fascia: No Fascia: No Fascia: No Tendon: No Tendon: No Tendon: No Muscle: No Muscle: No Muscle: No Joint: No Joint: No Joint: No Bone: No Bone: No Bone: No [name], [name] ([id number]) [id number] Page 5 of 18 None None None Epithelialization: Debridement - Excisional Debridement - Excisional N/A Debridement: 12:02 12:02 N/A Pre-procedure Verification/Time Out Taken: Lidocaine Lidocaine N/A Pain Control: Subcutaneous, Slough Subcutaneous, Slough N/A Tissue Debrided: Skin/Subcutaneous Tissue Skin/Subcutaneous Tissue N/A Level: 0.59 0.61 N/A Debridement A (sq cm): rea Curette Curette N/A Instrument: Minimum Minimum N/A Bleeding: Pressure Pressure N/A Hemostasis A chieved: 0 0 N/A Procedural Pain: 0 0 N/A Post Procedural Pain: Procedure was tolerated well Procedure was tolerated well N/A Debridement Treatment Response: 10x7.5x0.2 12.9x6x0.2 N/A Post Debridement Measurements L x W x D (cm) 11.781 12.158 N/A Post Debridement Volume: (cm) Scarring: Yes Scarring: Yes Scarring: Yes Periwound Skin Texture: No Abnormalities Noted No Abnormalities Noted No Abnormalities Noted Periwound Skin Moisture: Hemosiderin Staining: Yes No Abnormalities Noted No Abnormalities Noted Periwound Skin Color: Ecchymosis: No No Abnormality No Abnormality No Abnormality Temperature: Yes Yes Yes Tenderness on Palpation: Debridement Debridement N/A Procedures Performed: Wound Number: [id number]  Photos: No Photos Left, Medial Lower Leg Left Achilles Left, Medial  Ankle Wound Location: Gradually Appeared Gradually Appeared Gradually Appeared Wounding Event: Sickle Cell Lesion Sickle Cell Lesion Sickle Cell Lesion Primary Etiology: N/A N/A N/A Secondary Etiology: Anemia, Sickle Cell Disease, N/A Anemia, Sickle Cell Disease, Comorbid History: Neuropathy Neuropathy 03/18/2023 03/18/2023 05/05/2023 Date A cquired: 4 4 0 Weeks of Treatment: Open Open Open Wound Status: No No No Wound Recurrence: No No No Clustered Wound: 3.8x6.7x0.1 1.9x1.2x0.1 1.9x1.2x0.1 Measurements L x W x D (cm) 19.996 1.791 1.791 A (cm) : rea 2 0.179 0.179 Volume (cm) : -1828.30% -9.60% N/A % Reduction in A rea: -1823.10% -9.80% N/A % Reduction in Volume: Full Thickness Without Exposed Full Thickness Without Exposed Full Thickness With Exposed Support Classification: Support Structures Support Structures Structures Medium Medium Medium Exudate A mount: Serous Serous Serosanguineous Exudate Type: amber amber red, brown Exudate Color: Distinct, outline attached N/A Distinct, outline attached Wound Margin: None Present (0%) N/A None Present (0%) Granulation A mount: N/A N/A N/A Granulation Quality: Large (67-100%) N/A Large (67-100%) Necrotic A mount: Adherent Slough N/A Eschar, Adherent Slough  Necrotic Tissue: Fat Layer (Subcutaneous Tissue): Yes N/A Fat Layer (Subcutaneous Tissue): Yes Exposed Structures: Fascia: No Fascia: No Tendon: No Tendon: No Muscle: No Muscle: No Joint: No Joint: No Bone: No Bone: No None N/A None Epithelialization: N/A N/A N/A Debridement: N/A N/A N/A Pain Control: N/A N/A N/A Tissue Debrided: N/A N/A N/A Level: N/A N/A N/A Debridement A (sq cm): rea N/A N/A N/A Instrument: N/A N/A N/A Bleeding: N/A N/A N/A Hemostasis A chieved: N/A N/A N/A Procedural Pain: N/A N/A N/A Post Procedural Pain: Debridement Treatment Response: N/A N/A N/A Post Debridement Measurements L x N/A N/A N/A W x D (cm) N/A  N/A N/A Post Debridement Volume: (cm) Scarring: Yes No Abnormalities Noted Excoriation: No Periwound Skin Texture: Induration: No [name], [name] ([id number]) [id number] Page 6 of 18 Callus: No Crepitus: No Rash: No Scarring: No No Abnormalities Noted No Abnormalities Noted Maceration: No Periwound Skin Moisture: Dry/Scaly: No No Abnormalities Noted No Abnormalities Noted Atrophie Blanche: No Periwound Skin Color: Cyanosis: No Ecchymosis: No Erythema: No Hemosiderin Staining: No Mottled: No Pallor: No Rubor: No No Abnormality N/A No Abnormality Temperature: Yes N/A Yes Tenderness on Palpation: N/A N/A N/A Procedures Performed: Treatment Notes Electronic Signature(s) Signed: 05/05/2023 12:43:55 PM By: Duanne Guess MD FACS Entered By: Duanne Guess on 05/05/2023 09:17:15 -------------------------------------------------------------------------------- Multi-Disciplinary Care Plan Details Patient Name: Date of Service: [name], [name] 05/05/2023 11:30 A M Medical Record Number: [id number] Patient Account Number: [id number] Date of Birth/Sex: Treating RN: February 28, 1971 (52 y.o. [name], [name] Primary Care Gem Conkle: Julianne Handler Other Clinician: Referring Eevie Lapp: Treating Zarya Lasseigne/Extender: Koleen Nimrod Weeks in Treatment: 106 Multidisciplinary Care Plan reviewed with physician Active Inactive Necrotic Tissue Nursing Diagnoses: Impaired tissue integrity related to necrotic/devitalized tissue Knowledge deficit related to management of necrotic/devitalized tissue Goals: Necrotic/devitalized tissue will be minimized in the wound bed Date Initiated: 12/04/2022 Target Resolution Date: 04/18/2023 Goal Status: Active Patient/caregiver will verbalize understanding of reason and process for debridement of necrotic tissue Date Initiated: 12/04/2022 Target Resolution Date: 04/18/2023 Goal Status:  Active Interventions: Assess patient pain level pre-, during and post procedure and prior to discharge Provide education on necrotic tissue and debridement process Treatment Activities: Apply topical anesthetic as ordered : 12/04/2022 Enzymatic debridement : 12/04/2022 Notes: Wound/Skin Impairment Nursing Diagnoses: Impaired tissue integrity Goals: Patient/caregiver will verbalize understanding of skin care regimen [name], [name] ([id number]) [id number] Page 7 of 18 Date Initiated: 04/17/2021 Target Resolution Date: 05/12/2023 Goal Status: Active Ulcer/skin breakdown will have a volume reduction of 30% by week 4 Date Initiated: 04/17/2021 Date Inactivated: 05/29/2021 Target Resolution Date: 05/15/2021 Goal Status: Met Ulcer/skin breakdown will have a volume reduction of 50% by week 8 Date Initiated: 05/29/2021 Date Inactivated: 06/26/2021 Target Resolution Date: 06/26/2021 Goal Status: Unmet Unmet Reason: venous reflux Interventions: Assess patient/caregiver ability to obtain necessary supplies Assess patient/caregiver ability to perform ulcer/skin care regimen upon admission and as needed Assess ulceration(s) every visit Provide education on ulcer and skin care Treatment Activities: Topical wound management initiated : 04/17/2021 Notes: 06/08/21: Left leg wounds greater than 30% volume reduction, right leg acute infection. Electronic Signature(s) Signed: 05/06/2023 4:32:11 PM By: Fonnie Mu RN Entered By: Fonnie Mu on 05/05/2023 08:39:15 -------------------------------------------------------------------------------- Pain Assessment Details Patient Name: Date of Service: [name], [name] 05/05/2023 11:30 A M Medical Record Number: [id number] Patient Account Number: [id number] Date of Birth/Sex: Treating RN: 07-10-1970 (52 y.o. F) Primary Care Dellene Mcgroarty: Julianne Handler Other Clinician: Referring Micaylah Bertucci: Treating Katharin Schneider/Extender:  Koleen Nimrod Weeks in Treatment: [phone] Active  Problems Location of Pain Severity and Description of Pain Patient Has Paino Yes Site Locations Rate the pain. Current Pain Level: Insensate Worst Pain Level: 10 Least Pain Level: 0 Tolerable Pain Level: 2 Character of Pain Describe the Pain: Burning Pain Management and Medication Current Pain Management: Electronic Signature(s) Signed: 05/05/2023 11:36:30 AM By: Dwan Bolt, [name] ([id number]) [id number] Page 8 of 18 Entered By: Dayton Scrape on 05/05/2023 08:15:37 -------------------------------------------------------------------------------- Patient/Caregiver Education Details Patient Name: Date of Service: [name], [place] [name] 12/16/2024andnbsp11:30 A M Medical Record Number: [id number] Patient Account Number: [id number] Date of Birth/Gender: Treating RN: [birth year] (52 y.o. [name], [name] Primary Care Physician: Julianne Handler Other Clinician: Referring Physician: Treating Physician/Extender: Koleen Nimrod Weeks in Treatment: 106 Education Assessment Education Provided To: Patient Education Topics Provided Wound Debridement: Methods: Explain/Verbal Responses: Reinforcements needed, State content correctly Wound/Skin Impairment: Methods: Explain/Verbal Responses: Reinforcements needed, State content correctly Electronic Signature(s) Signed: 05/06/2023 4:32:11 PM By: Fonnie Mu RN Entered By: Fonnie Mu on 05/05/2023 08:39:32 -------------------------------------------------------------------------------- Wound Assessment Details Patient Name: Date of Service: [name], [name] 05/05/2023 11:30 A M Medical Record Number: [id number] Patient Account Number: [id number] Date of Birth/Sex: Treating RN: 02-15-1971 (52 y.o. F) Primary Care Lisel Siegrist: Julianne Handler Other Clinician: Referring Jayshun Galentine: Treating Tashari Schoenfelder/Extender: Koleen Nimrod Weeks in Treatment: 106 Wound Status Wound Number: 17 Primary Etiology: Sickle Cell Lesion Wound Location: Right, Lateral Lower Leg Wound Status: Open Wounding Event: Gradually Appeared Comorbid History: Anemia, Sickle Cell Disease, Neuropathy Date Acquired: 10/05/2012 Weeks Of Treatment: 106 Clustered Wound: Yes Photos [name] ([id number]) [id number] Page 9 of 18 Wound Measurements Length: (cm) 10 Width: (cm) 7.5 Depth: (cm) 0.2 Area: (cm) 58.905 Volume: (cm) 11.781 % Reduction in Area: 68.7% % Reduction in Volume: 37.3% Epithelialization: None Wound Description Classification: Full Thickness Without Exposed Support Structures Wound Margin: Distinct, outline attached Exudate Amount: Large Exudate Type: Serosanguineous Exudate Color: red, brown Foul Odor After Cleansing: No Slough/Fibrino Yes Wound Bed Granulation Amount: Medium (34-66%) Exposed Structure Granulation Quality: Pink Fascia Exposed: No Necrotic Amount: Medium (34-66%) Fat Layer (Subcutaneous Tissue) Exposed: Yes Necrotic Quality: Adherent Slough Tendon Exposed: No Muscle Exposed: No Joint Exposed: No Bone Exposed: No Periwound Skin Texture Texture Color No Abnormalities Noted: No No Abnormalities Noted: No Scarring: Yes Ecchymosis: No Hemosiderin Staining: Yes Moisture No Abnormalities Noted: Yes Temperature / Pain Temperature: No Abnormality Tenderness on Palpation: Yes Treatment Notes Wound #17 (Lower Leg) Wound Laterality: Right, Lateral Cleanser Soap and Water Discharge Instruction: May shower and wash wound with dial antibacterial soap and water prior to dressing change. Wound Cleanser Discharge Instruction: Cleanse the wound with wound cleanser prior to applying a clean dressing using gauze sponges, not tissue or cotton balls. Peri-Wound Care Triamcinolone 15 (g) Discharge Instruction: Use triamcinolone 15 (g) as  directed Sween Lotion (Moisturizing lotion) Discharge Instruction: Apply moisturizing lotion as directed Topical Gentamicin Discharge Instruction: As directed by physician Mupirocin Ointment Discharge Instruction: Apply Mupirocin (Bactroban) as instructed Primary Dressing Santyl Ointment Discharge Instruction: Apply nickel thick amount to wound bed as instructed Secondary Dressing [name] ([id number]) [phone].pdf Page 10 of 18 ABD Pad, 5x9 Discharge Instruction: Apply over primary dressing as directed. Woven Gauze Sponge, Non-Sterile 4x4 in Discharge Instruction: Moisten with normal saline and Apply over primary dressing as directed. Secured With Elastic Bandage 4 inch (ACE bandage) Discharge Instruction: Secure with ACE bandage as directed. Kerlix Roll Sterile, 4.5x3.1 (in/yd) Discharge Instruction: Secure with Kerlix as directed. Compression Wrap Compression Stockings Add-Ons Electronic Signature(s) Signed:  05/05/2023 11:36:30 AM By: Dayton Scrape Entered By: Dayton Scrape on 05/05/2023 08:30:27 -------------------------------------------------------------------------------- Wound Assessment Details Patient Name: Date of Service: [name] 05/05/2023 11:30 A M Medical Record Number: [id number] Patient Account Number: [id number] Date of Birth/Sex: Treating RN: 07-31-1970 (52 y.o. F) Primary Care Man Bonneau: Julianne Handler Other Clinician: Referring Jaicion Laurie: Treating Emonie Espericueta/Extender: Koleen Nimrod Weeks in Treatment: 106 Wound Status Wound Number: 21 Primary Etiology: Sickle Cell Lesion Wound Location: Right, Medial Ankle Secondary Etiology: Venous Leg Ulcer Wounding Event: Gradually Appeared Wound Status: Open Date Acquired: 06/26/2021 Comorbid History: Anemia, Sickle Cell Disease, Neuropathy Weeks Of Treatment: 96 Clustered Wound: No Photos Wound Measurements Length: (cm) 12.9 Width: (cm) 6 Depth: (cm)  0.2 Area: (cm) 60.79 Volume: (cm) 12.158 % Reduction in Area: -109.8% % Reduction in Volume: -319.5% Epithelialization: None Wound Description Classification: Full Thickness Without Exposed Support Structures Wound Margin: Distinct, outline attached Exudate Amount: Large Exudate Type: Serosanguineous Exudate Color: red, brown Decelles, Larya ([id number]) Wound Bed Granulation Amount: Small (1-33%) Granulation Quality: Pink Necrotic Amount: Large (67-100%) Necrotic Quality: Adherent Slough Foul Odor After Cleansing: No Slough/Fibrino Yes [phone].pdf Page 11 of 18 Exposed Structure Fascia Exposed: No Fat Layer (Subcutaneous Tissue) Exposed: Yes Tendon Exposed: No Muscle Exposed: No Joint Exposed: No Bone Exposed: No Periwound Skin Texture Texture Color No Abnormalities Noted: No No Abnormalities Noted: Yes Scarring: Yes Temperature / Pain Temperature: No Abnormality Moisture No Abnormalities Noted: Yes Tenderness on Palpation: Yes Treatment Notes Wound #21 (Ankle) Wound Laterality: Right, Medial Cleanser Soap and Water Discharge Instruction: May shower and wash wound with dial antibacterial soap and water prior to dressing change. Wound Cleanser Discharge Instruction: Cleanse the wound with wound cleanser prior to applying a clean dressing using gauze sponges, not tissue or cotton balls. Peri-Wound Care Triamcinolone 15 (g) Discharge Instruction: Use triamcinolone 15 (g) as directed Sween Lotion (Moisturizing lotion) Discharge Instruction: Apply moisturizing lotion as directed Topical Gentamicin Discharge Instruction: As directed by physician Mupirocin Ointment Discharge Instruction: Apply Mupirocin (Bactroban) as instructed Primary Dressing Santyl Ointment Discharge Instruction: Apply nickel thick amount to wound bed as instructed Secondary Dressing ABD Pad, 5x9 Discharge Instruction: Apply over primary dressing as directed. Woven  Gauze Sponge, Non-Sterile 4x4 in Discharge Instruction: Moisten with normal saline and Apply over primary dressing as directed. Secured With Elastic Bandage 4 inch (ACE bandage) Discharge Instruction: Secure with ACE bandage as directed. Kerlix Roll Sterile, 4.5x3.1 (in/yd) Discharge Instruction: Secure with Kerlix as directed. Compression Wrap Compression Stockings Add-Ons Electronic Signature(s) Signed: 05/05/2023 11:36:30 AM By: Dayton Scrape Entered By: Dayton Scrape on 05/05/2023 08:31:11 [name] ([id number]) [id number] Page 12 of 18 -------------------------------------------------------------------------------- Wound Assessment Details Patient Name: Date of Service: [name] 05/05/2023 11:30 A M Medical Record Number: [id number] Patient Account Number: [id number] Date of Birth/Sex: Treating RN: 06-06-1970 (52 y.o. F) Primary Care Yisell Sprunger: Julianne Handler Other Clinician: Referring Lejend Dalby: Treating Wendelin Bradt/Extender: Koleen Nimrod Weeks in Treatment: 106 Wound Status Wound Number: 26 Primary Etiology: Sickle Cell Lesion Wound Location: Left, Lateral Lower Leg Wound Status: Open Wounding Event: Gradually Appeared Comorbid History: Anemia, Sickle Cell Disease, Neuropathy Date Acquired: 03/18/2023 Weeks Of Treatment: 4 Clustered Wound: No Photos Wound Measurements Length: (cm) 6.4 Width: (cm) 5.9 Depth: (cm) 0.1 Area: (cm) 29.657 Volume: (cm) 2.966 % Reduction in Area: -739.2% % Reduction in Volume: -740.2% Epithelialization: None Wound Description Classification: Full Thickness Without Exposed Support Structures Wound Margin: Distinct, outline attached Exudate Amount: Medium Exudate Type: Serous Exudate Color: amber Foul Odor  After Cleansing: No Slough/Fibrino Yes Wound Bed Granulation Amount: None Present (0%) Exposed Structure Necrotic Amount: Large (67-100%) Fascia Exposed: No Necrotic  Quality: Adherent Slough Fat Layer (Subcutaneous Tissue) Exposed: Yes Tendon Exposed: No Muscle Exposed: No Joint Exposed: No Bone Exposed: No Periwound Skin Texture Texture Color No Abnormalities Noted: No No Abnormalities Noted: Yes Scarring: Yes Temperature / Pain Temperature: No Abnormality Moisture No Abnormalities Noted: Yes Tenderness on Palpation: Yes Treatment Notes Wound #26 (Lower Leg) Wound Laterality: Left, Lateral Cleanser Soap and Water Discharge Instruction: May shower and wash wound with dial antibacterial soap and water prior to dressing change. Wound Cleanser Discharge Instruction: Cleanse the wound with wound cleanser prior to applying a clean dressing using gauze sponges, not tissue or cotton balls. Peri-Wound Care [name], [name] ([id number]) [id number] Page 13 of 18 Triamcinolone 15 (g) Discharge Instruction: Use triamcinolone 15 (g) as directed Sween Lotion (Moisturizing lotion) Discharge Instruction: Apply moisturizing lotion as directed Topical Gentamicin Discharge Instruction: As directed by physician Mupirocin Ointment Discharge Instruction: Apply Mupirocin (Bactroban) as instructed Primary Dressing Santyl Ointment Discharge Instruction: Apply nickel thick amount to wound bed as instructed Secondary Dressing ABD Pad, 5x9 Discharge Instruction: Apply over primary dressing as directed. Woven Gauze Sponge, Non-Sterile 4x4 in Discharge Instruction: Moisten with normal saline and Apply over primary dressing as directed. Secured With Elastic Bandage 4 inch (ACE bandage) Discharge Instruction: Secure with ACE bandage as directed. Kerlix Roll Sterile, 4.5x3.1 (in/yd) Discharge Instruction: Secure with Kerlix as directed. Compression Wrap Compression Stockings Add-Ons Electronic Signature(s) Signed: 05/05/2023 11:36:30 AM By: Dayton Scrape Entered By: Dayton Scrape on 05/05/2023  08:32:13 -------------------------------------------------------------------------------- Wound Assessment Details Patient Name: Date of Service: [name] 05/05/2023 11:30 A M Medical Record Number: [id number] Patient Account Number: [id number] Date of Birth/Sex: Treating RN: Oct 07, 1970 (52 y.o. F) Primary Care Tiara Bartoli: Julianne Handler Other Clinician: Referring Shariff Lasky: Treating Shakeela Rabadan/Extender: Koleen Nimrod Weeks in Treatment: 106 Wound Status Wound Number: 27 Primary Etiology: Sickle Cell Lesion Wound Location: Left, Medial Lower Leg Wound Status: Open Wounding Event: Gradually Appeared Comorbid History: Anemia, Sickle Cell Disease, Neuropathy Date Acquired: 03/18/2023 Weeks Of Treatment: 4 Clustered Wound: No Photos JOSSLYN, FIRPO ([id number]) [id number] Page 14 of 18 Wound Measurements Length: (cm) 3.8 Width: (cm) 6.7 Depth: (cm) 0.1 Area: (cm) 19.996 Volume: (cm) 2 % Reduction in Area: -1828.3% % Reduction in Volume: -1823.1% Epithelialization: None Wound Description Classification: Full Thickness Without Exposed Support Structures Wound Margin: Distinct, outline attached Exudate Amount: Medium Exudate Type: Serous Exudate Color: amber Foul Odor After Cleansing: No Slough/Fibrino Yes Wound Bed Granulation Amount: None Present (0%) Exposed Structure Necrotic Amount: Large (67-100%) Fascia Exposed: No Necrotic Quality: Adherent Slough Fat Layer (Subcutaneous Tissue) Exposed: Yes Tendon Exposed: No Muscle Exposed: No Joint Exposed: No Bone Exposed: No Periwound Skin Texture Texture Color No Abnormalities Noted: No No Abnormalities Noted: Yes Scarring: Yes Temperature / Pain Temperature: No Abnormality Moisture No Abnormalities Noted: Yes Tenderness on Palpation: Yes Treatment Notes Wound #27 (Lower Leg) Wound Laterality: Left, Medial Cleanser Soap and Water Discharge Instruction: May shower  and wash wound with dial antibacterial soap and water prior to dressing change. Wound Cleanser Discharge Instruction: Cleanse the wound with wound cleanser prior to applying a clean dressing using gauze sponges, not tissue or cotton balls. Peri-Wound Care Triamcinolone 15 (g) Discharge Instruction: Use triamcinolone 15 (g) as directed Sween Lotion (Moisturizing lotion) Discharge Instruction: Apply moisturizing lotion as directed Topical Gentamicin Discharge Instruction: As directed by physician Mupirocin Ointment Discharge Instruction: Apply Mupirocin (Bactroban)  as instructed Primary Dressing Santyl Ointment Discharge Instruction: Apply nickel thick amount to wound bed as instructed Secondary Dressing ABD Pad, 5x9 Discharge Instruction: Apply over primary dressing as directed. SAHANA, GIANNATTASIO ([id number]) [id number] Page 15 of 18 Woven Gauze Sponge, Non-Sterile 4x4 in Discharge Instruction: Moisten with normal saline and Apply over primary dressing as directed. Secured With Elastic Bandage 4 inch (ACE bandage) Discharge Instruction: Secure with ACE bandage as directed. Kerlix Roll Sterile, 4.5x3.1 (in/yd) Discharge Instruction: Secure with Kerlix as directed. Compression Wrap Compression Stockings Add-Ons Electronic Signature(s) Signed: 05/05/2023 11:36:30 AM By: Dayton Scrape Entered By: Dayton Scrape on 05/05/2023 08:31:40 -------------------------------------------------------------------------------- Wound Assessment Details Patient Name: Date of Service: [name] 05/05/2023 11:30 A M Medical Record Number: [id number] Patient Account Number: [id number] Date of Birth/Sex: Treating RN: 09-16-1970 (52 y.o. F) Primary Care Lyah Millirons: Julianne Handler Other Clinician: Referring Bliss Behnke: Treating Genoveva Singleton/Extender: Koleen Nimrod Weeks in Treatment: 106 Wound Status Wound Number: 28 Primary Etiology: Sickle Cell  Lesion Wound Location: Left Achilles Wound Status: Open Wounding Event: Gradually Appeared Date Acquired: 03/18/2023 Weeks Of Treatment: 4 Clustered Wound: No Wound Measurements Length: (cm) 1.9 Width: (cm) 1.2 Depth: (cm) 0.1 Area: (cm) 1.791 Volume: (cm) 0.179 % Reduction in Area: -9.6% % Reduction in Volume: -9.8% Wound Description Classification: Full Thickness Without Exposed Support Exudate Amount: Medium Exudate Type: Serous Exudate Color: amber Structures Periwound Skin Texture Texture Color No Abnormalities Noted: No No Abnormalities Noted: No Moisture No Abnormalities Noted: No Electronic Signature(s) Signed: 05/05/2023 11:36:30 AM By: Dayton Scrape Entered By: Dayton Scrape on 05/05/2023 08:25:36 [name] ([id number]) [id number] Page 16 of 18 -------------------------------------------------------------------------------- Wound Assessment Details Patient Name: Date of Service: [name] 05/05/2023 11:30 A M Medical Record Number: [id number] Patient Account Number: [id number] Date of Birth/Sex: Treating RN: May 05, 1971 (51 y.o. [name], [name] Primary Care Taquisha Phung: Julianne Handler Other Clinician: Referring Rolando Hessling: Treating Chandrika Sandles/Extender: Koleen Nimrod Weeks in Treatment: 106 Wound Status Wound Number: 29 Primary Etiology: Sickle Cell Lesion Wound Location: Left, Medial Ankle Wound Status: Open Wounding Event: Gradually Appeared Comorbid History: Anemia, Sickle Cell Disease, Neuropathy Date Acquired: 05/05/2023 Weeks Of Treatment: 0 Clustered Wound: No Photos Wound Measurements Length: (cm) 1.9 Width: (cm) 1.2 Depth: (cm) 0.1 Area: (cm) 1.791 Volume: (cm) 0.179 % Reduction in Area: % Reduction in Volume: Epithelialization: None Tunneling: No Undermining: No Wound Description Classification: Full Thickness With Exposed Support Structures Wound Margin: Distinct, outline  attached Exudate Amount: Medium Exudate Type: Serosanguineous Exudate Color: red, brown Foul Odor After Cleansing: No Slough/Fibrino Yes Wound Bed Granulation Amount: None Present (0%) Exposed Structure Necrotic Amount: Large (67-100%) Fascia Exposed: No Necrotic Quality: Eschar, Adherent Slough Fat Layer (Subcutaneous Tissue) Exposed: Yes Tendon Exposed: No Muscle Exposed: No Joint Exposed: No Bone Exposed: No Periwound Skin Texture Texture Color No Abnormalities Noted: No No Abnormalities Noted: No Callus: No Atrophie Blanche: No Crepitus: No Cyanosis: No Excoriation: No Ecchymosis: No Induration: No Erythema: No Rash: No Hemosiderin Staining: No Scarring: No Mottled: No Pallor: No Moisture Rubor: No No Abnormalities Noted: No Dry / Scaly: No Temperature / Pain Maceration: No Temperature: No Abnormality Tenderness on Palpation: JERMANIE, NESMITH ([id number]) [id number] Page 17 of 18 Treatment Notes Wound #29 (Ankle) Wound Laterality: Left, Medial Cleanser Soap and Water Discharge Instruction: May shower and wash wound with dial antibacterial soap and water prior to dressing change. Wound Cleanser Discharge Instruction: Cleanse the wound with wound cleanser prior to applying a clean dressing using gauze sponges, not tissue  or cotton balls. Peri-Wound Care Triamcinolone 15 (g) Discharge Instruction: Use triamcinolone 15 (g) as directed Sween Lotion (Moisturizing lotion) Discharge Instruction: Apply moisturizing lotion as directed Topical Gentamicin Discharge Instruction: As directed by physician Mupirocin Ointment Discharge Instruction: Apply Mupirocin (Bactroban) as instructed Primary Dressing Santyl Ointment Discharge Instruction: Apply nickel thick amount to wound bed as instructed Secondary Dressing ABD Pad, 5x9 Discharge Instruction: Apply over primary dressing as directed. Woven Gauze Sponge, Non-Sterile 4x4  in Discharge Instruction: Moisten with normal saline and Apply over primary dressing as directed. Secured With Elastic Bandage 4 inch (ACE bandage) Discharge Instruction: Secure with ACE bandage as directed. Kerlix Roll Sterile, 4.5x3.1 (in/yd) Discharge Instruction: Secure with Kerlix as directed. Compression Wrap Compression Stockings Add-Ons Electronic Signature(s) Signed: 05/05/2023 11:36:30 AM By: Dayton Scrape Signed: 05/06/2023 4:32:11 PM By: Fonnie Mu RN Entered By: Dayton Scrape on 05/05/2023 08:35:11 -------------------------------------------------------------------------------- Vitals Details Patient Name: Date of Service: [name], [name] 05/05/2023 11:30 A M Medical Record Number: [id number] Patient Account Number: [id number] Date of Birth/Sex: Treating RN: 06-28-1970 (52 y.o. F) Primary Care Emmajo Bennette: Julianne Handler Other Clinician: Referring Kiyah Demartini: Treating Urania Pearlman/Extender: Koleen Nimrod Weeks in Treatment: 106 Vital Signs Time Taken: 11:15 Temperature (F): 98.1 Height (in): 67 Pulse (bpm): 88 Weight (lbs): 134 Respiratory Rate (breaths/min): 18 Body Mass Index (BMI): 21 Blood Pressure (mmHg): 146/73 JODEL, SCIANDRA ([id number]) [id number] Page 18 of 18 Reference Range: 80 - 120 mg / dl Electronic Signature(s) Signed: 05/05/2023 11:36:30 AM By: Dayton Scrape Entered By: Dayton Scrape on 05/05/2023 08:15:15

## 2023-05-07 NOTE — Progress Notes (Signed)
[name], [name] ([id number]) [id number] Page 1 of 18 Visit Report for 05/05/2023 Chief Complaint Document Details Patient Name: Date of Service: [name] [name] 05/05/2023 11:30 A M Medical Record Number: [id number] Patient Account Number: [id number] Date of Birth/Sex: Treating RN: 1971-03-19 (52 y.o. F) Primary Care Provider: Julianne [name] Other Clinician: Referring Provider: Treating Provider/Extender: Koleen Nimrod Weeks in Treatment: 106 Information Obtained from: Patient Chief Complaint the patient is here for evaluation of her bilateral lower extremity sickle cell ulcers 04/17/2021; patient comes in for substantial wounds on the right and left lower leg Electronic Signature(s) Signed: 05/05/2023 12:43:55 PM By: Duanne Guess MD FACS Entered By: Duanne Guess on 05/05/2023 09:17:24 -------------------------------------------------------------------------------- Debridement Details Patient Name: Date of Service: [name], [name] 05/05/2023 11:30 A M Medical Record Number: [id number] Patient Account Number: [id number] Date of Birth/Sex: Treating RN: 07-29-1970 (52 y.o. [name], [name] Primary Care Provider: Julianne [name] Other Clinician: Referring Provider: Treating Provider/Extender: Koleen Nimrod Weeks in Treatment: 106 Debridement Performed for Assessment: Wound #17 Right,Lateral Lower Leg Performed By: Physician Duanne Guess, MD The following information was scribed by: Fonnie Mu The information was scribed for: Duanne Guess Debridement Type: Debridement Level of Consciousness (Pre-procedure): Awake and Alert Pre-procedure Verification/Time Out Yes - 12:02 Taken: Start Time: 12:02 Pain Control: Lidocaine Percent of Wound Bed Debrided: 1% T Area Debrided (cm): otal 0.59 Tissue and other material debrided: Viable, Non-Viable, Slough, Subcutaneous, Slough Level:  Skin/Subcutaneous Tissue Debridement Description: Excisional Instrument: Curette Bleeding: Minimum Hemostasis Achieved: Pressure End Time: 12:02 Procedural Pain: 0 Post Procedural Pain: 0 Response to Treatment: Procedure was tolerated well Level of Consciousness (Post- Awake and Alert procedure): Post Debridement Measurements of Total Wound Length: (cm) 10 Width: (cm) 7.5 [name], [name] ([id number]) [id number] Page 2 of 18 Depth: (cm) 0.2 Volume: (cm) 11.781 Character of Wound/Ulcer Post Debridement: Improved Post Procedure Diagnosis Same as Pre-procedure Electronic Signature(s) Signed: 05/05/2023 12:43:55 PM By: Duanne Guess MD FACS Signed: 05/06/2023 4:32:11 PM By: Fonnie Mu RN Entered By: Fonnie Mu on 05/05/2023 09:03:37 -------------------------------------------------------------------------------- Debridement Details Patient Name: Date of Service: [name], [name] 05/05/2023 11:30 A M Medical Record Number: [id number] Patient Account Number: [id number] Date of Birth/Sex: Treating RN: [birth year] (52 y.o. [name], [name] Primary Care Provider: Julianne [name] Other Clinician: Referring Provider: Treating Provider/Extender: Koleen Nimrod Weeks in Treatment: 106 Debridement Performed for Assessment: Wound #21 Right,Medial Ankle Performed By: Physician Duanne Guess, MD The following information was scribed by: Fonnie Mu The information was scribed for: Duanne Guess Debridement Type: Debridement Severity of Tissue Pre Debridement: Fat layer exposed Level of Consciousness (Pre-procedure): Awake and Alert Pre-procedure Verification/Time Out Yes - 12:02 Taken: Start Time: 12:02 Pain Control: Lidocaine Percent of Wound Bed Debrided: 1% T Area Debrided (cm): otal 0.61 Tissue and other material debrided: Viable, Non-Viable, Slough, Subcutaneous, Slough Level: Skin/Subcutaneous  Tissue Debridement Description: Excisional Instrument: Curette Bleeding: Minimum Hemostasis Achieved: Pressure End Time: 12:02 Procedural Pain: 0 Post Procedural Pain: 0 Response to Treatment: Procedure was tolerated well Level of Consciousness (Post- Awake and Alert procedure): Post Debridement Measurements of Total Wound Length: (cm) 12.9 Width: (cm) 6 Depth: (cm) 0.2 Volume: (cm) 12.158 Character of Wound/Ulcer Post Debridement: Improved Severity of Tissue Post Debridement: Fat layer exposed Post Procedure Diagnosis Same as Pre-procedure Electronic Signature(s) Signed: 05/05/2023 12:43:55 PM By: Duanne Guess MD FACS Signed: 05/06/2023 4:32:11 PM By: Fonnie Mu RN Entered By: Fonnie Mu on 05/05/2023 09:04:08 [name] ([id number]) [id number] Page 3 of 18 --------------------------------------------------------------------------------  HPI Details Patient Name: Date of Service: [name] [name] 05/05/2023 11:30 A M Medical Record Number: [id number] Patient Account Number: [id number] Date of Birth/Sex: Treating RN: [birth year] (52 y.o. F) Primary Care Provider: Julianne [name] Other Clinician: Referring Provider: Treating Provider/Extender: Koleen Nimrod Weeks in Treatment: 106 History of Present Illness Location: medial and lateral ankle region on the right and left medial malleolus Quality: Patient reports experiencing a shooting pain to affected area(s). Severity: Patient states wound(s) are getting worse. Duration: right lower extremity bimalleolar ulcers have been present for approximately 2 years; the right medial malleolus ulcer has been there proximally 6 months Timing: Pain in wound is constant (hurts all the time) Context: The wound would happen gradually ssociated Signs and Symptoms: Patient reports having increase discharge. A HPI Description: 52 year old patient with a history of sickle  cell anemia who was last seen by me with ulceration of the right lower extremity above the ankle and was referred to Dr. Marzetta Board for a surgical debridement as I was unable to do anything in the office due to excruciating pain. At that stage she was referred from the plastic surgery service to dermatology who treated her for a skin infection with doxycycline and then Levaquin and a local antibiotic ointment. I understand the patient has since developed ulceration on the left ankle both medial and lateral and was now referred back to the wound center as dermatology has finished the management. I do not have any notes from the dermatology department Old notes: 52 year old patient with a history of sickle cell anemia, pain bilateral lower extremities, right lower extremity ulcer and has a history of receiving a skin graft( Theraskin) several months ago. She has been visiting the wound center [facility] and was seen by Dr. Leanord Hawking and Dr. Marzetta Board. after prolonged conservator therapy between July 2016 and January 2017. She had been seen by the plastic surgeon and taken to the OR for debridement and application of Theraskin. She had 3 applications of Theraskin and was then treated with collagen. Prior to that she had a history of similar problems in 2014 and was treated conservatively. Had a reflux study done for the right lower extremity in August 2016 without reflux or DVT . Past medical history significant for sickle cell disease, anemia, leg ulcers, cholelithiasis,and has never been a smoker. Once the patient was discharged on the wound center she says within 2 or 3 weeks the problems recurred and she has been treating it conservatively. since I saw her 3 weeks ago at [facility] she has been unable to get her dressing material but has completed a course of doxycycline. 6/7/ 2017 -- lower extremity venous duplex reflux evaluation was done No evidence of SVT or DVT in the RLL. No venous incompetence in  the RLL. No further vascular workup is indicated at this time. She was seen by Dr. Mina Marble, on 10/04/2015. She agreed with the plan of taking her to the OR for debridement and application of theraskin and would also take biopsies to rule out pyoderma gangrenosum. Follow-up note dated May 31 received and she was status post application of Theraskin to multiple ulcers around the right ankle. Pathology did not show evidence of malignancy or pyoderma gangrenosum. She would continue to see as in the wound clinic for further care and see Dr. Marzetta Board as needed. The patient brought the biopsy report and it was consistent with stasis ulcer no evidence of malignancy and the comment was that there was some adjacent  neovascularization, fibrosis and patchy perivascular chronic inflammation. 11/15/2015 -- today we applied her first application of Theraskin 11/30/15; TheraSkin #2 12/13/2015 -- she is having a lot of pain locally and is here for possible application of a theraskin today. 01/16/2016 -- the patient has significant pain and has noticed despite in spite of all local care and oral pain medication. It is impossible to debride her in the office. 02/06/2016 -- I do not see any notes from Dr. Leta Baptist( the patient has not made a call to the office know as she heard from them) and the only visit to recently was with her PCP Dr. Gypsy Decant -- I saw her on 01/16/2016 and prescribed 90 tablets of oxycodone 10 mg and did lab work and screening for HIV. the HIV was negative and hemoglobin was 6.3 with a WBC count of 14.9 and hematocrit of 17.8 with platelets of 561. 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 The bimalleolar ulcers to the right lower extremity have been present for approximately 2 years, the left medial malleolus ulcer has been present approximately 6 months. She has  followed with Dr.Thimmappa in the past and has had a total of 3 applications of Theraskin (01/2015, 09/2015, 06/17/16). She has also followed with Dr. Meyer Russel here in the clinic and has received 2 applications of TheraSkin (11/10/15, 11/30/15). The patient does experience chronic, and is not amenable to debridement. 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 She is not currently on any antibiotic therapy and has not been treated with any recently. 07/17/2016 -- was seen by Dr. Leta Baptist of plastic surgery who saw her 2 weeks postop application of Theraskin #3. She had removed her dressing and asked her to apply silver alginate on alternate days and follow-up back with the wound center. Future debridements and application of skin substitute would have to be done in the hospital due to her high risk for anesthesia. READMISSION 04/17/2021 Patient is now a 52 year old woman that we have had in this clinic for a prolonged period of time and 2016-2017 and then again for 2 visits in February 2018. At that point she had wounds on the right lower leg predominantly medial. She had also been seen by plastic surgery Dr. Marzetta Board who I believe took her to the OR for operative debridement and application of TheraSkin in 2017. After she left our clinic she was followed for a very prolonged period of time in the wound care center in [facility] who then referred her ultimately to [facility] where she was seen by Dr. Mardene Speak. Again taken her to the OR for skin grafting which apparently did not take. She had multiple other attempts at dressings although I have not really looked over all of these notes in great detail. She has not been seen in a wound care center in about a year. She states over the last year in addition to her right lower leg she has developed wounds on the left lower leg quite extensive. She is using Xeroform to all of these wounds without really any improvement. She also  has Medicaid which does not cover wound products. The patient has had vascular work-ups in the past including most recently on 03/28/2021 showing biphasic waveforms on the right triphasic at the PTA and biphasic at the dorsalis pedis on the left. She was unable to tolerate any degree of compression  to do ABIs. Unfortunately TBI's were also not done. She had venous reflux studies done in 2017. This did not show any evidence of a DVT or SVT and no venous incompetence was noted in the right leg at the time this was the only side with the wound As noted I did not look all over her old records. She apparently had a course of HBO and Baptist although I am not sure what the indication would have been. In [place] ([id number]) [id number] Page 4 of 18 any case she developed seizures and terminated treatment earlier. She is generally much more disabled than when we last saw her in clinic. She can no longer walk pretty much wheelchair-bound because predominantly of pain in the left hip. 04/24/2021; the patient tolerated the wraps we put on. We used Santyl and Hydrofera Blue under compression. I brought her back for a nurse visit for a change in dressing. With Medicaid we will have a hard time getting anything paid for and hence the need for compression. She arrives in clinic with all the wounds looking somewhat better in terms of surface 12/20; circumferential wound on the right from the lateral to the medial. She has open areas on the left medial and left lateral x2 on all of this with the same surface. This does not look completely healthy although she does have some epithelialization. She is not complaining of a lot of pain which is unusual for her sickle ulcers. I have not looked over her extensive records from [facility]. She had recent arterial studies and has a history of venous reflux studies I will need to look these over although I do not believe she has significant  arterial disease 2023 05/22/2021; patient's wound areas measure slightly smaller. Still a lot of drainage coming from the right we have been using Hydrofera Blue and Santyl with some improvement in the wound surfaces. She tells me she will be getting transfused later in the week for her underlying sickle cell anemia I have looked over her recent arterial studies which were done in the fall. This was in November and showed biphasic and triphasic waveforms but she could not tolerate ABIs because of pressure and unfortunately TBI's were not done. She has not had recent venous reflux studies that I can see 1/10; not much change about the same surface area. This has a yellowish surface to it very gritty. We have been using Santyl and Hydrofera Blue for a prolonged period. Culture I did last week showed methicillin sensitive staph aureus "rare". Our intake nurse reports greenish drainage which may be the Hydrofera Blue itself 1/17; wounds are continue to measure smaller although I am not sure about the accuracy here. Especially the areas on the right are covered in what looks to be a nonviable surface although she does have some epithelialization. Similarly she has areas on the left medial and left lateral ankle area which appear to have a better surface and perhaps are slightly smaller. We have been using Santyl and Hydrofera Blue. She cannot tolerate mechanical debridements She went for her reflux studies which showed significant reflux at the greater saphenous vein at the saphenofemoral junction as well as the greater saphenous vein in the proximal calf on the left she had reflux in the thigh and the common femoral vein and supra vein Fishel vein reflux in the greater saphenous vein. I will have vein and vascular look at this. My thoughts have been that these are likely sickle wounds. I looked through  her old records from [facility] wound care center and then when she graduated to [facility] wound  care center where she saw Dr. Ronda Fairly and Dr. Mardene Speak. Although I can see she had reflux studies done I do not see that she actually saw a vein and vascular. I went over the fact that she had operative debridements and actual skin grafting that did not take. I do not think these wounds have ever really progressed towards healing 1/31;Substantial wounds on the right ankle area. Hyper granulated very gritty adherent debris on the surface. 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 On the left she has no DVT or SVT deep vein reflux in the common femoral vein. Superficial vein reflux in the greater saphenous vein on although the vein diameter is not really all that large. I do not think there is anything that can be done with these although I am going to send her for consultation to vein and vascular. 2/7; Wound exam; substantial wound area on the right posterior ankle area and areas on the left medial ankle and left lateral ankle. I was able to debride the left medial ankle last week fairly aggressively and it is back this week to a completely nonviable surface She will see vascular surgery this Friday and I would like them to review the venous studies and also any comments on her arterial status. If they do not see an issue here I am going to refer her to plastic surgery for an operative debridement perhaps intraoperative ACell or Integra. Eventually she will require a deep tissue culture again 2/14; substantial wound area on the right posterior ankle, medial ankle. We have been using silver alginate The patient was seen by vein and vascular she had  both venous reflux studies and arterial studies. 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 She had no evidence of superficial venous thrombosis. She did have superficial vein reflux at the saphenofemoral junction and the greater saphenous vein. On the left no evidence of a DVT no evidence of superficial venous throat thrombosis she did have deep vein reflux in the common femoral vein and superficial vein reflux in the greater saphenous vein but these were not felt to be amenable to ablation. In terms of arterial studies she had triphasic and wife biphasic wave waveforms bilaterally not felt to have a significant arterial issue. I do not get the feeling that they felt that any part of her nonhealing wounds were related to either arterial or venous issues. They did note that she had venous reflux at the right at the [facility] and [facility]. And also on the left there were reflux in the deep system at the common femoral vein and greater saphenous vein in the proximal thigh. Nothing amenable to ablation. 2/20; she is making some decent progress on the right where there is nice skin between the 2 open areas on the right ankle. The surfaces here do not look viable yet there is some surrounding epithelialization. She still has a small area on the left  medial ankle area. Hyper-granulated [name] 2/28 patient has an appointment with plastic surgery on 3/8. We will see her back on 3/9. She may have to call us to get the area redressed. We've been using Santyl under silver alginate. We made a nice improvement on the left medial ankle. The larger wounds on the right also looks somewhat better in terms of epithelialization although I think they could benefit from an aggressive debridement if plastic surgery would be willing to do that. Perhaps placement of Integra or a cell 07/26/2021: She saw Dr. Arita Miss yesterday. He raised the question as to  whether or not this might be pyoderma and wanted to wait until that question was answered by dermatology before proceeding with any sort of operative debridement. We have continue to use Santyl under silver alginate with Kerlix and Coban wraps. Overall, her wounds appear to be continuing to contract and epithelialize, with some granulation tissue present. There continues to be some slough on all wound surfaces. 08/09/2021: She has not been able to get an appointment with dermatology because apparently the offices in [facility] and [place] do not accept Medicaid. She is looking into whether or not she can be seen at the main [facility] dermatology clinic. This is necessary because plastic surgery is concerned that her wounds might represent pyoderma and they did not want to do any procedure until that was clarified. We have been using Santyl under silver alginate with Kerlix and Coban wraps. Today, there was a greater amount of drainage on her dressings with a slight green discoloration and significant odor. Despite this, her wounds continue to contract and epithelialize. There is pale granulation tissue present and actually, on the left medial ankle, the granulation tissue is a bit hypertrophic. 08/16/2021: Last week, I took a culture and this grew back rare methicillin-resistant Staph aureus and rare corynebacterium. The MRSA was sensitive to gentamicin which we began applying topically on an empiric basis. This week, her wounds are a bit smaller and the drainage and odor are less. Her primary care provider is working on assisting the patient with a dermatology evaluation. She has been in silver alginate over the gentamicin that was started last week along with Kerlix and Coban wraps. 08/23/2021: Because she has Medicaid, we have been unable to get her into see any dermatologist in the [place] to rule out pyoderma gangrenosum, which was a requirement from plastic surgery prior to any sort of  debridement and grafting. Despite this, however, all of her wounds continue to get smaller. The wound on her left medial ankle is nearly closed. There is no odor from the wounds, although she still accumulates a modest amount of drainage on her dressings. [name], [name] ([id number]) [id number] Page 5 of 18 08/30/2021: The lateral right ankle wound and the medial left ankle wound are a bit smaller today. The medial right ankle wound is about the same size. They are less tender. We have still been unable to get her into dermatology. 09/06/2021: All of the wounds are about the same size today. She continues to endorse minimal pain. I communicated with Dr. Arita Miss in plastic surgery regarding our issues getting a dermatology appointment; he was out of town but indicated that he would look into perhaps performing the biopsy in his office and will have his office contact her. 09/14/2021: The patient has an appointment in dermatology, but it is not until October. Her wounds are roughly the same; she continues to have very thick purulent-looking drainage  on her dressings. 09/20/2021: The left medial wound is nearly closed and just has a bit of accumulated eschar on the surface. The right medial and lateral ankle wounds are perhaps a little bit smaller. They continue to have a very pale surface with accumulation of thin slough. 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 I did not think Jodie Echevaria was indicated based on this. She is getting topical mupirocin with Prisma silver collagen. 10/04/2021: The patient was not seen in clinic last week due to childcare coverage issues. In the interim, the left medial leg wound has closed. The right sided leg wounds are smaller. There is more granulation tissue coming through, particularly on the lateral wound. The surface remains somewhat gritty. We have been applying topical mupirocin and Prisma silver collagen. 10/11/2021:  The left medial leg wound remains closed. She does complain of some anesthetic sensation to the area. Both of the right-sided leg wounds are smaller but still have accumulated slough. 10/18/2021: Both right-sided leg wounds are minimally smaller this week. She still continues to accumulate slough and has thick drainage on her dressings. 10/23/2021: Both wounds continue to contract. There is still slough buildup. She has been approved for a keratin-based skin substitute trial product but it will not be available until next week. 10/30/2021: The wounds are about the same to perhaps slightly smaller. There is still continued slough buildup. Unfortunately, the rep for the keratin based product did not show up today and did not answer his phone when called. 11/08/2021: The wounds are little bit smaller today. She continues to have thick drainage but the surfaces are relatively clean with just a little bit of slough accumulation. She reported to me today that she is unable to completely flex her left ankle and on examination it seems this is potentially related to scar tissue from her wounds. We do have the ProgenaMatrix trial product available for her today. 11/15/2021: Both wounds are smaller today. There is some slough accumulation on the surfaces, but the medial wound, in particular looks like it is filling in and is less deep. She did hear from physical therapy and she is going to start working with them on July 11. She is here for her second application of the trial skin substitute, ProgenaMatrix. 11/22/2021: Both wounds continue to contract, the medial more dramatically than the lateral. Both wounds have a layer of slough on the surface, but underneath this, the gritty fibrous tissue has a little bit more of a pink cast to it rather than being as pale as it has been. 11/29/2021: The wounds are roughly the same size this week, perhaps a millimeter or 2 smaller. The medial wound has filled in and is nearly flush  with the surrounding skin surface. She continues to have a lot of slough accumulation on both surfaces. 12/06/2021: No significant change to her wounds, but she has a new opening on her dorsal foot, just distal to the right lateral ankle wound. The area on her left medial ankle that reopened looks a little bit larger today. She has quite a bit of pain associated with the new wound. 12/12/2021: Her wounds look about the same but the new opening on her right lateral dorsal foot is a little bit bigger. She continues to have a fair amount of pain with this wound. 12/26/2021: The left medial ankle wound is tiny and superficial. She has 2 areas of crusting on her left lateral ankle, however, that appear to be threatening to  open again. Her right medial ankle wound is a little bit smaller today but still continues to accumulate thick rubbery slough. The new dorsal foot wound is exquisitely painful but there is no odor or purulent drainage. No erythema or induration. The right lateral ankle wound looks about the same today, again with thick rubbery slough. 01/03/2022: The left medial ankle wound has closed again. Both right ankle wounds appear to be about the same size with thick rubbery slough. The dorsal foot wound on the right continues to be quite painful and she stated that she did not want any debridement of that site today. 01/10/2022: No real change to any of her wounds. She continues to accumulate thick slough. The dorsal foot wound has merged with the lateral malleolar wound. She is experiencing significant pain in the dorsal foot portion of the ulcer. 01/16/2022: Absolutely no change or progress in her wounds. 01/24/2022: Her wounds are unchanged. She continues to build up slough and the wounds on her dorsal right foot are still exquisitely tender. 01/31/2022: The wounds actually measure a little bit narrower today. They still have thick slough on the surface but the underlying tissue seems a little  less fibrotic. We changed to Iodosorb last week. 02/07/2022: Wounds continue to slowly epithelialize around the parameters. She has less pain today. She still accumulates a fairly substantial layer of slough. 02/15/2022: No change to the wounds overall. The measured a little bit larger today per the intake nurse. She continues to have substantial slough accumulation and her pain is a little bit worse. 02/21/2022. No change at all to her wounds. I do not see that plastic surgery has received her referral yet. 02/28/2022: The wounds remain unchanged. They are dry and fibrotic with accumulation of slough and eschar. She did receive an appointment to see plastic surgery on October 23, but the office called her back and indicated they needed to reschedule and that the next available appointment was not until December. The patient became angry and decided she did not want to see this plastics group. 10/19; the patient sees Dr. Maylene Roes again next week. She is using Medihoney as the primary dressing changing this herself. 03/21/2022: She saw Dr. Ferd Hibbs at the wound care center at [facility]. Dr. Ferd Hibbs was also unable to debride her, secondary to pain and is planning to take her to the operating room for operative debridement and potential skin substitute placement. Her wounds are unchanged with thick yellow slough and a fibrotic base. She says they are too painful to be debrided. In addition, yesterday her hemoglobin was 4 and she received 2 units packed red blood cell transfusion. 04/04/2022: Her operation at [facility] is scheduled for December 15. She continues to use Medihoney on her wounds. They are a little bit less painful today and she is willing to entertain the possibility of debridement. The wounds measured a little bit larger in all dimensions today but the layer of slough is not as thick as usual. 04/18/2022: Her wounds actually measured a little bit smaller today and the  extension onto the dorsal foot from the lateral wound has healed. They are less tender and there is significantly less slough present as compared to prior visits. [name], [name] ([id number]) [id number] Page 6 of 18 05/09/2022: She had to reschedule her surgery due to lack of childcare. It is now planned for January 5. The wounds are basically unchanged, but she had a lot more drainage, which was thick and somewhat purulent in  appearance. No significant odor. 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 They are quite painful today and she does not want to have debridement performed. 05/30/2022: The patient underwent surgical debridement and placement of TheraSkin to her wounds on January 5. She has been doing well and does not endorse any pain. The TheraSkin is intact and looks beautiful. It is sutured in place. There is no exudate or significant drainage. 06/04/2022: Her TheraSkin remains intact and I am starting to see granulation tissue emerging underneath the surface. No concern for infection. 06/12/2022: The TheraSkin is intact and there is more granulation tissue emerging. It is starting to look a little bit dry, however. No malodor or purulent drainage. 06/19/2022: The TheraSkin continues to look good. The edges are a bit dry, but the central portion of each of her wounds is showing a nice pink color. 06/27/2022: The TheraSkin on the lateral wound remains almost completely intact. There is nice pink tissue peaking through the mesh of the TheraSkin. On the medial leg, it is starting to breakdown a little bit, but most of the TheraSkin remains intact here, as well. Both wounds measured smaller today. 07/03/2022: The wounds are fairly dry around the perimeter today. Some of the suture is hanging loose. 07/12/2022: Both wounds are measuring smaller today. The medial ulcer is getting a bit too dry. The lateral ulcer continues to have nice buds of granulation tissue  emerging. 07/26/2022: The moisture balance on both wounds is much better today. For the first time since I began seeing her, I see actual beefy red granulation tissue on the lateral ankle wound. There are more buds of deep pink granulation tissue emerging on the medial ankle. There is some dry eschar around the edges of the medial wound and a tiny amount of slough overlying the good granulation tissue on the lateral wound. 08/09/2022: Unfortunately, she has a new wound on her left lateral lower leg, just above the ankle. It is extremely painful for her. It penetrates to the fat layer and has the same woody, fibrotic character as her previous wounds. The other wounds are both a little bit smaller with slough and some eschar around the periphery. 08/14/2022: Her right lateral ankle wound is a little bit smaller. Unfortunately, she has had an increase in her drainage and it has a purulent nature to it, similar to what was present prior to her surgical debridement. She also reports odor coming from her wounds this week. The left lateral leg wound remains extremely tender. 08/21/2022: The left lateral ankle wound is larger and more painful today. Both right ankle wounds have a little bit more vital appearance, but they have accumulated more slough. She reports less odor this week. She is currently taking Augmentin. 08/28/2022: The left lateral ankle wound continues to enlarge and remains quite painful. Both of the right ankle wounds actually look better this week. There is improved tissue quality with less slough accumulation. 09/27/2022: The left lateral ankle wound actually looks nearly healed. She says that she has been applying some silver alginate that she had leftover. She was unable to afford the Santyl that was prescribed, but found a tube that she had on hand at home and has been using this on her right ankle wounds. These wounds look about the same and have accumulated the usual layer of slough.  Unfortunately, a portion of the dorsal foot wound has reopened and is exquisitely painful today. 10/09/2022: The left lateral ankle wound is healed. The dorsal foot wound  appears to have expanded and remains exquisitely painful. The medial and lateral right ankle wounds look about the same size, but they are quite a bit cleaner. 10/23/2022: The left lateral ankle remains closed. The dorsal foot wound has expanded to the point that it now converges with the right lateral leg wound. She has accumulated fairly thick slough on all of the open surfaces. 11/06/2022: No significant change to any of her wounds, although the dorsal foot aspect of the right lateral wound is a little bit smaller. She has been approved for the [employer] and [employer] for [employer] and will be able to get a full year's supply. 11/20/2022: The dorsal foot aspect of the right lateral wound has almost completely closed. I am seeing nice pink tissue starting to emerge from the fibrotic surface of both wounds. The medial leg wound appears to be a little bit smaller today. She has been using Santyl. 12/04/2022: The dorsal foot aspect of the right lateral wound has closed. There is more pink granulation tissue emerging on her wounds. There are buds of epithelium beginning to fill in from the edges of her wound, particularly at the proximal aspect of the medial wound and the distal aspect of the lateral wound. Significantly less slough formation than in the past. 12/18/2022: The wounds measured about the same size today, but there is better granulation tissue filling in and the overall surface consistency is improving. She does have slough accumulation on the surfaces, but no malodor or purulent drainage. 01/01/2023: No significant change to her wounds. The surface seems a little bit more fibrotic today. She is complaining of more pain. 01/15/2023: Her wounds are stable today. She has been taking Augmentin based on the culture that I took  at her previous visit. She says that she has felt physically better since being on the antibiotic and has had less drainage from her wounds. 01/29/2023: The wounds are unchanged and remained fibrotic and painful. She does report having less pain while on antibiotics and less drainage, as well. 02/07/2023: Her wounds have gotten larger and more painful. She completed her course of antibiotics yesterday. She still has a fair amount of drainage coming from her wounds. 02/24/2023: Her wounds continue to enlarge. They are more painful. 03/18/2023: Her wounds continue to enlarge and become more painful. The systemic antibiotics do not seem to have made any difference this round. She is having more drainage, as well. 04/01/2023: Her wounds are about the same size today. They have a thick layer of slough on both the medial and lateral aspect. Underneath, however, the tissue is not quite as pale as it usually appears. She does have 3 new wounds on her left lower leg, 1 lateral and 2 medial. They are covered with slough and eschar and are exquisitely painful. She has been applying silver alginate to these at home. Her case was discussed in the [facility] conference recently. Several suggestions were made including Lidoderm patches, vascular referral for an angiogram, and potentially further surgery. They also suggested getting a TCOM done with the Lidoderm patch in situ. They agreed with the decision to put her on pentoxifylline. 04/14/2023: Her wounds have worsened across the board. All of them are larger and exquisitely painful. Her insurance would not cover the Lidoderm patches. She has an appointment with vascular surgery coming up next week. 05/05/2023: Her wounds continue to deteriorate. The left leg ulcers have merged and are now almost circumferential. The right leg ulcers are larger. They are  exquisitely painful. She paid for the lidocaine patches out-of-pocket but says they do not help. She  met with vascular surgery and they felt it was unlikely that there was an arterial issue, but they are going to get lower extremity arterial Doppler studies and go from there. ZYMIRA, GLASSMEYER ([id number]) [id number] Page 7 of 18 Electronic Signature(s) Signed: 05/05/2023 12:43:55 PM By: Duanne Guess MD FACS Entered By: Duanne Guess on 05/05/2023 09:18:53 -------------------------------------------------------------------------------- Physical Exam Details Patient Name: Date of Service: [name], [name] 05/05/2023 11:30 A M Medical Record Number: [id number] Patient Account Number: [id number] Date of Birth/Sex: Treating RN: 11-Oct-1970 (52 y.o. F) Primary Care Provider: Julianne [name] Other Clinician: Referring Provider: Treating Provider/Extender: Koleen Nimrod Weeks in Treatment: 106 Constitutional Slightly hypertensive. . . . no acute distress. Respiratory Normal work of breathing on room air.. Notes 05/05/2023: Her wounds continue to deteriorate. The left leg ulcers have merged and are now almost circumferential. The right leg ulcers are larger. They are exquisitely painful. Electronic Signature(s) Signed: 05/05/2023 12:43:55 PM By: Duanne Guess MD FACS Entered By: Duanne Guess on 05/05/2023 09:21:38 -------------------------------------------------------------------------------- Physician Orders Details Patient Name: Date of Service: [name], [name] 05/05/2023 11:30 A M Medical Record Number: [id number] Patient Account Number: [id number] Date of Birth/Sex: Treating RN: 08-16-1970 (52 y.o. 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 - Dr. Lady Gary Return A Anesthetic (In clinic) Topical Lidocaine 4% applied to wound bed Bathing/ Shower/ Hygiene May shower and wash wound with soap and water. Additional Orders / Instructions Follow Nutritious Diet Juven Shake 1-2 times daily. QUATINA, MURIE ([id number]) [id number] Page 8 of 18 Lymphedema Treatment Plan - Exercise, Compression and Elevation Elevate legs 30 - 60 minutes at or above heart level at least 3 - 4 times daily as able/tolerated Avoid standing for long periods and elevate leg(s) parallel to the floor when sitting Wound Treatment Wound #17 - Lower Leg Wound Laterality: Right, Lateral Cleanser: Soap and Water Every Other Day/30 Days Discharge Instructions: May shower and wash wound with dial antibacterial soap and water prior to dressing change. Cleanser: Wound Cleanser Every Other Day/30 Days Discharge Instructions: Cleanse the wound with wound cleanser prior to applying a clean dressing using gauze sponges, not tissue or cotton balls. Peri-Wound Care: Triamcinolone 15 (g) Every Other Day/30 Days Discharge Instructions: Use triamcinolone 15 (g) as directed Peri-Wound Care: Sween Lotion (Moisturizing lotion) Every Other Day/30 Days Discharge Instructions: Apply moisturizing lotion as directed Topical: Gentamicin Every Other Day/30 Days Discharge Instructions: As directed by physician Topical: Mupirocin Ointment Every Other Day/30 Days Discharge Instructions: Apply Mupirocin (Bactroban) as instructed Prim Dressing: Santyl Ointment Every Other Day/30 Days ary Discharge Instructions: Apply nickel thick amount to wound bed as instructed Secondary Dressing: ABD Pad, 5x9 Every Other Day/30 Days Discharge Instructions: Apply  over primary dressing as directed. Secondary Dressing: Woven Gauze Sponge, Non-Sterile 4x4 in Every Other Day/30 Days Discharge Instructions: Moisten with normal saline and Apply over primary dressing as directed. Secured With: Elastic Bandage 4 inch (ACE bandage) Every Other Day/30 Days Discharge Instructions: Secure with ACE bandage as directed. Secured With: [employer]  Roll Sterile, 4.5x3.1 (in/yd) Every Other Day/30 Days Discharge Instructions: Secure with Kerlix as directed. Wound #21 - Ankle Wound Laterality: Right, Medial Cleanser: Soap and Water Every Other Day/30 Days Discharge Instructions: May shower and wash wound with dial antibacterial soap and water prior to dressing change. Cleanser: Wound Cleanser Every Other Day/30 Days Discharge Instructions: Cleanse the wound with wound cleanser prior to applying a clean dressing using gauze sponges, not tissue or cotton balls. Peri-Wound Care: Triamcinolone 15 (g) Every Other Day/30 Days Discharge Instructions: Use triamcinolone 15 (g) as directed Peri-Wound Care: Sween Lotion (Moisturizing lotion) Every Other Day/30 Days Discharge Instructions: Apply moisturizing lotion as directed Topical: Gentamicin Every Other Day/30 Days Discharge Instructions: As directed by physician Topical: Mupirocin Ointment Every Other Day/30 Days Discharge Instructions: Apply Mupirocin (Bactroban) as instructed Prim Dressing: Santyl Ointment Every Other Day/30 Days ary Discharge Instructions: Apply nickel thick amount to wound bed as instructed Secondary Dressing: ABD Pad, 5x9 Every Other Day/30 Days Discharge Instructions: Apply over primary dressing as directed. Secondary Dressing: Woven Gauze Sponge, Non-Sterile 4x4 in Every Other Day/30 Days Discharge Instructions: Moisten with normal saline and Apply over primary dressing as directed. Secured With: Elastic Bandage 4 inch (ACE bandage) Every Other Day/30 Days Discharge Instructions: Secure with ACE bandage  as directed. Secured With: [employer], 4.5x3.1 (in/yd) Every Other Day/30 Days Discharge Instructions: Secure with Kerlix as directed. Wound #26 - Lower Leg Wound Laterality: Left, Lateral Cleanser: Soap and Water Every Other Day/30 Days Discharge Instructions: May shower and wash wound with dial antibacterial soap and water prior to dressing change. [name], YEAGLE ([id number]) [id number] Page 9 of 18 Cleanser: Wound Cleanser Every Other Day/30 Days Discharge Instructions: Cleanse the wound with wound cleanser prior to applying a clean dressing using gauze sponges, not tissue or cotton balls. Peri-Wound Care: Triamcinolone 15 (g) Every Other Day/30 Days Discharge Instructions: Use triamcinolone 15 (g) as directed Peri-Wound Care: Sween Lotion (Moisturizing lotion) Every Other Day/30 Days Discharge Instructions: Apply moisturizing lotion as directed Topical: Gentamicin Every Other Day/30 Days Discharge Instructions: As directed by physician Topical: Mupirocin Ointment Every Other Day/30 Days Discharge Instructions: Apply Mupirocin (Bactroban) as instructed Prim Dressing: Santyl Ointment Every Other Day/30 Days ary Discharge Instructions: Apply nickel thick amount to wound bed as instructed Secondary Dressing: ABD Pad, 5x9 Every Other Day/30 Days Discharge Instructions: Apply over primary dressing as directed. Secondary Dressing: Woven Gauze Sponge, Non-Sterile 4x4 in Every Other Day/30 Days Discharge Instructions: Moisten with normal saline and Apply over primary dressing as directed. Secured With: Elastic Bandage 4 inch (ACE bandage) Every Other Day/30 Days Discharge Instructions: Secure with ACE bandage as directed. Secured With: [employer], 4.5x3.1 (in/yd) Every Other Day/30 Days Discharge Instructions: Secure with Kerlix as directed. Wound #27 - Lower Leg Wound Laterality: Left, Medial Cleanser: Soap and Water Every Other Day/30  Days Discharge Instructions: May shower and wash wound with dial antibacterial soap and water prior to dressing change. Cleanser: Wound Cleanser Every Other Day/30 Days Discharge Instructions: Cleanse the wound with wound cleanser prior to applying a clean dressing using gauze sponges, not tissue or cotton balls. Peri-Wound Care: Triamcinolone 15 (g) Every Other Day/30 Days Discharge Instructions: Use triamcinolone 15 (g) as directed Peri-Wound Care: Sween Lotion (Moisturizing lotion) Every Other Day/30 Days Discharge Instructions: Apply moisturizing lotion as directed Topical: Gentamicin Every Other Day/30 Days Discharge Instructions: As directed by physician Topical: Mupirocin Ointment Every Other Day/30 Days Discharge Instructions: Apply Mupirocin (Bactroban) as instructed Prim Dressing: Santyl Ointment Every Other Day/30  Days ary Discharge Instructions: Apply nickel thick amount to wound bed as instructed Secondary Dressing: ABD Pad, 5x9 Every Other Day/30 Days Discharge Instructions: Apply over primary dressing as directed. Secondary Dressing: Woven Gauze Sponge, Non-Sterile 4x4 in Every Other Day/30 Days Discharge Instructions: Moisten with normal saline and Apply over primary dressing as directed. Secured With: Elastic Bandage 4 inch (ACE bandage) Every Other Day/30 Days Discharge Instructions: Secure with ACE bandage as directed. Secured With: [employer], 4.5x3.1 (in/yd) Every Other Day/30 Days Discharge Instructions: Secure with Kerlix as directed. Wound #29 - Ankle Wound Laterality: Left, Medial Cleanser: Soap and Water Every Other Day/30 Days Discharge Instructions: May shower and wash wound with dial antibacterial soap and water prior to dressing change. Cleanser: Wound Cleanser Every Other Day/30 Days Discharge Instructions: Cleanse the wound with wound cleanser prior to applying a clean dressing using gauze sponges, not tissue or cotton balls. Peri-Wound Care:  Triamcinolone 15 (g) Every Other Day/30 Days Discharge Instructions: Use triamcinolone 15 (g) as directed Peri-Wound Care: Sween Lotion (Moisturizing lotion) Every Other Day/30 Days Discharge Instructions: Apply moisturizing lotion as directed VEGAS, RITTNER ([id number]) [phone].pdf Page 10 of 18 Topical: Gentamicin Every Other Day/30 Days Discharge Instructions: As directed by physician Topical: Mupirocin Ointment Every Other Day/30 Days Discharge Instructions: Apply Mupirocin (Bactroban) as instructed Prim Dressing: Santyl Ointment Every Other Day/30 Days ary Discharge Instructions: Apply nickel thick amount to wound bed as instructed Secondary Dressing: ABD Pad, 5x9 Every Other Day/30 Days Discharge Instructions: Apply over primary dressing as directed. Secondary Dressing: Woven Gauze Sponge, Non-Sterile 4x4 in Every Other Day/30 Days Discharge Instructions: Moisten with normal saline and Apply over primary dressing as directed. Secured With: Elastic Bandage 4 inch (ACE bandage) Every Other Day/30 Days Discharge Instructions: Secure with ACE bandage as directed. Secured With: [employer], 4.5x3.1 (in/yd) Every Other Day/30 Days Discharge Instructions: Secure with Kerlix as directed. Patient Medications llergies: No Known Allergies A Notifications Medication Indication Start End 05/05/2023 lidocaine DOSE topical 5 % cream - apply thin layer to wounds with dressing changes for pain control Electronic Signature(s) Signed: 05/05/2023 12:43:55 PM By: Duanne Guess MD FACS Previous Signature: 05/05/2023 12:23:28 PM Version By: Duanne Guess MD FACS Entered By: Duanne Guess on 05/05/2023 02:72:53 -------------------------------------------------------------------------------- Problem List Details Patient Name: Date of Service: [name], [name] 05/05/2023 11:30 A M Medical Record Number: [id number] Patient Account Number: [id number] Date  of Birth/Sex: Treating RN: 1970/07/09 (52 y.o. 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 04/17/2021 No Yes severity L97.828 Non-pressure chronic ulcer of other part of left lower leg with other specified 04/01/2023 No Yes severity D57.1 Sickle-cell disease without crisis 04/17/2021 No Yes Inactive Problems MARILIA, HARDESTER ([id number]) [id number] Page 11 of 18 Resolved Problems ICD-10 Code Description Active Date Resolved Date L97.822 Non-pressure chronic ulcer of other part of left lower leg with fat layer exposed 08/09/2022 08/09/2022 Electronic Signature(s) Signed: 05/05/2023 12:43:55 PM By: Duanne Guess MD FACS Entered By: Duanne Guess on 05/05/2023 09:16:55 -------------------------------------------------------------------------------- Progress Note Details Patient Name: Date of Service: [name], [name] 05/05/2023 11:30 A M Medical Record Number: [id number] Patient Account Number: [id number] Date of Birth/Sex: Treating RN: 1970-07-26 (52 y.o. F) Primary Care Provider: Julianne [name] Other Clinician: Referring Provider: Treating Provider/Extender: Koleen Nimrod Weeks in Treatment:  106 Subjective Chief Complaint Information obtained from Patient the patient is here for evaluation of her bilateral lower extremity sickle cell ulcers 04/17/2021; patient comes in for substantial wounds on the right and left lower leg History of Present Illness (HPI) The following HPI elements were documented for the patient's wound: Location: medial and lateral ankle region on the right and left medial malleolus Quality: Patient reports experiencing a  shooting pain to affected area(s). Severity: Patient states wound(s) are getting worse. Duration: right lower extremity bimalleolar ulcers have been present for approximately 2 years; the right medial malleolus ulcer has been there proximally 6 months Timing: Pain in wound is constant (hurts all the time) Context: The wound would happen gradually Associated Signs and Symptoms: Patient reports having increase discharge. 52 year old patient with a history of sickle cell anemia who was last seen by me with ulceration of the right lower extremity above the ankle and was referred to Dr. Marzetta Board for a surgical debridement as I was unable to do anything in the office due to excruciating pain. At that stage she was referred from the plastic surgery service to dermatology who treated her for a skin infection with doxycycline and then Levaquin and a local antibiotic ointment. I understand the patient has since developed ulceration on the left ankle both medial and lateral and was now referred back to the wound center as dermatology has finished the management. I do not have any notes from the dermatology department Old notes: 52 year old patient with a history of sickle cell anemia, pain bilateral lower extremities, right lower extremity ulcer and has a history of receiving a skin graft( Theraskin) several months ago. She has been visiting the wound center [facility] and was seen by Dr. Leanord Hawking and Dr. Marzetta Board. after prolonged conservator therapy between July 2016 and January 2017. She had been seen by the plastic surgeon and taken to the OR for debridement and application of Theraskin. She had 3 applications of Theraskin and was then treated with collagen. Prior to that she had a history of similar problems in 2014 and was treated conservatively. Had a reflux study done for the right lower extremity in August 2016 without reflux or DVT . Past medical history significant for sickle cell disease, anemia,  leg ulcers, cholelithiasis,and has never been a smoker. Once the patient was discharged on the wound center she says within 2 or 3 weeks the problems recurred and she has been treating it conservatively. since I saw her 3 weeks ago at [facility] she has been unable to get her dressing material but has completed a course of doxycycline. 6/7/ 2017 -- lower extremity venous duplex reflux evaluation was done  No evidence of SVT or DVT in the RLL. No venous incompetence in the RLL. No further vascular workup is indicated at this time. She was seen by Dr. Mina Marble, on 10/04/2015. She agreed with the plan of taking her to the OR for debridement and application of theraskin and would also take biopsies to rule out pyoderma gangrenosum. Follow-up note dated May 31 received and she was status post application of Theraskin to multiple ulcers around the right ankle. Pathology did not show evidence of malignancy or pyoderma gangrenosum. She would continue to see as in the wound clinic for further care and see Dr. Marzetta Board as needed. The patient brought the biopsy report and it was consistent with stasis ulcer no evidence of malignancy and the comment was that there was some adjacent neovascularization, fibrosis and patchy perivascular chronic inflammation.  11/15/2015 -- today we applied her first application of Theraskin 11/30/15; TheraSkin #2 12/13/2015 -- she is having a lot of pain locally and is here for possible application of a theraskin today. 01/16/2016 -- the patient has significant pain and has noticed despite in spite of all local care and oral pain medication. It is impossible to debride her in the office. 02/06/2016 -- I do not see any notes from Dr. Leta Baptist( the patient has not made a call to the office know as she heard from them) and the only visit to recently was with her PCP Dr. Gypsy Decant -- I saw her on 01/16/2016 and prescribed 90 tablets of oxycodone 10 mg and did lab work and  screening for HIV. the HIV was negative and hemoglobin was 6.3 with a WBC count of 14.9 and hematocrit of 17.8 with platelets of 561. reticulocyte count was 15.5% READMISSIONPAIGELYN, [name] ([id number]) [phone].pdf Page 12 of 18 07/10/2016- The patient is here for readmission for bilateral lower extremity ulcers in the presence of sickle cell. The bimalleolar ulcers to the right lower extremity have been present for approximately 2 years, the left medial malleolus ulcer has been present approximately 6 months. She has followed with Dr.Thimmappa in the past and has had a total of 3 applications of Theraskin (01/2015, 09/2015, 06/17/16). She has also followed with Dr. Meyer Russel here in the clinic and has received 2 applications of TheraSkin (11/10/15, 11/30/15). The patient does experience chronic, and is not amenable to debridement. 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 She is not currently on any antibiotic therapy and has not been treated with any recently. 07/17/2016 -- was seen by Dr. Leta Baptist of plastic surgery who saw her 2 weeks postop application of Theraskin #3. She had removed her dressing and asked her to apply silver alginate on alternate days and follow-up back with the wound center. Future debridements and application of skin substitute would have to be done in the hospital due to her high risk for anesthesia. READMISSION 04/17/2021 Patient is now a 52 year old woman that we have had in this clinic for a prolonged period of time and 2016-2017 and then again for 2 visits in February 2018. At that point she had wounds on the right lower leg predominantly medial. She had also been seen by plastic surgery Dr. Marzetta Board who I believe took her to the OR for operative debridement and application of TheraSkin in 2017. After she left our clinic she was followed for a very prolonged period of time in the wound care center in [facility] who then referred her ultimately to [facility] where she was seen by Dr. Mardene Speak. Again taken her to the OR for skin grafting which apparently did not take. She had multiple other attempts at dressings although I have not really looked over all of these notes in great detail. She has not been seen in a wound care center in about a year. She states over the last year in addition to her right lower leg she has developed wounds on the left lower leg quite extensive. She is using Xeroform to all of these wounds without really any improvement. She also has Medicaid which does not cover wound products. The patient has had vascular work-ups in the past including most recently on 03/28/2021 showing biphasic waveforms on the right triphasic at the PTA and biphasic at the dorsalis pedis on the left. She was unable to tolerate any degree of  compression to do ABIs. Unfortunately TBI's were also not done. She had venous reflux studies done in 2017. This did not show any evidence of a DVT or SVT and no venous incompetence was noted in the right leg at the time this was the only side with the wound As noted I did not look all over her old records. She apparently had a course of HBO and Baptist although I am not sure what the indication would have been. In any case she developed seizures and terminated treatment earlier. She is generally much more disabled than when we last saw her in clinic. She can no longer walk pretty much wheelchair-bound because predominantly of pain in the left hip. 04/24/2021; the patient tolerated the wraps we put on. We used Santyl and Hydrofera Blue under compression. I brought her back for a nurse visit for a change in dressing. With Medicaid we will have a hard time getting anything paid for and hence the need for compression. She arrives in clinic with all the wounds looking somewhat better in terms of surface 12/20; circumferential wound on the right from the lateral to the medial. She  has open areas on the left medial and left lateral x2 on all of this with the same surface. This does not look completely healthy although she does have some epithelialization. She is not complaining of a lot of pain which is unusual for her sickle ulcers. I have not looked over her extensive records from [facility]. She had recent arterial studies and has a history of venous reflux studies I will need to look these over although I do not believe she has significant arterial disease 2023 05/22/2021; patient's wound areas measure slightly smaller. Still a lot of drainage coming from the right we have been using Hydrofera Blue and Santyl with some improvement in the wound surfaces. She tells me she will be getting transfused later in the week for her underlying sickle cell anemia I have looked over her recent arterial studies which were done in the fall. This was in November and showed biphasic and triphasic waveforms but she could not tolerate ABIs because of pressure and unfortunately TBI's were not done. She has not had recent venous reflux studies that I can see 1/10; not much change about the same surface area. This has a yellowish surface to it very gritty. We have been using Santyl and Hydrofera Blue for a prolonged period. Culture I did last week showed methicillin sensitive staph aureus "rare". Our intake nurse reports greenish drainage which may be the Hydrofera Blue itself 1/17; wounds are continue to measure smaller although I am not sure about the accuracy here. Especially the areas on the right are covered in what looks to be a nonviable surface although she does have some epithelialization. Similarly she has areas on the left medial and left lateral ankle area which appear to have a better surface and perhaps are slightly smaller. We have been using Santyl and Hydrofera Blue. She cannot tolerate mechanical debridements She went for her reflux studies which showed significant reflux at the  greater saphenous vein at the saphenofemoral junction as well as the greater saphenous vein in the proximal calf on the left she had reflux in the thigh and the common femoral vein and supra vein Fishel vein reflux in the greater saphenous vein. I will have vein and vascular look at this. My thoughts have been that these are likely sickle wounds. I looked through her old records from [facility]  wound care center and then when she graduated to [employer] [facility] wound care center where she saw Dr. Ronda Fairly and Dr. Mardene Speak. Although I can see she had reflux studies done I do not see that she actually saw a vein and vascular. I went over the fact that she had operative debridements and actual skin grafting that did not take. I do not think these wounds have ever really progressed towards healing 1/31;Substantial wounds on the right ankle area. Hyper granulated very gritty adherent debris on the surface. 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 On the left she has no DVT or SVT deep vein reflux in the common femoral vein. Superficial vein reflux in the greater saphenous vein on although the vein diameter is not really all that large. I do not think there is anything that can be done with these although I am going to send her for consultation to vein and vascular. 2/7; Wound exam; substantial wound area on the right posterior ankle area and areas on the left medial ankle and left lateral ankle. I was able to debride the left medial ankle last week fairly aggressively and it is back this week to a completely nonviable surface She will see vascular  surgery this Friday and I would like them to review the venous studies and also any comments on her arterial status. If they do not see an issue here I am going to refer her to plastic surgery for an operative debridement perhaps intraoperative ACell or Integra. Eventually she will require a deep tissue culture again 2/14; substantial wound area on the right posterior ankle, medial ankle. We have been using silver alginate The patient was seen by vein and vascular she had both venous reflux studies and arterial studies. 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 She had no evidence of superficial venous thrombosis. She did have superficial vein reflux at the saphenofemoral junction and the greater saphenous vein. On the left no evidence of a DVT no evidence of superficial venous throat thrombosis she did have deep vein reflux in the common femoral vein and superficial vein reflux in the greater saphenous vein but these were not felt to be amenable to ablation. In terms of arterial studies she had triphasic and wife biphasic wave waveforms bilaterally not felt to have a significant arterial issue. I do not get the feeling that they felt that any part of her nonhealing wounds were related to either arterial or venous issues. They did note that she had venous reflux at the right at the [facility] and [facility]. And also on the left there were reflux in the deep system at the common femoral vein and greater saphenous vein in the proximal thigh. Nothing amenable to ablation. 2/20; she is making some decent progress on the right where there is nice skin between the 2 open areas on the right ankle. The surfaces here do not look NGINA, COTNOIR ([id number]) [id number] Page 13 of 18 viable yet there is some surrounding epithelialization. She still has a small area on the  left medial ankle area. Hyper-granulated [name]  away [name] 2/28 patient has an appointment with plastic surgery on 3/8. We will see her back on 3/9. She may have to call us to get the area redressed. We've been using Santyl under silver alginate. We made a nice improvement on the left medial ankle. The larger wounds on the right also looks somewhat better in terms of epithelialization although I think they could benefit from an aggressive debridement if plastic surgery would be willing to do that. Perhaps placement of Integra or a cell 07/26/2021: She saw Dr. Arita Miss yesterday. He raised the question as to whether or not this might be pyoderma and wanted to wait until that question was answered by dermatology before proceeding with any sort of operative debridement. We have continue to use Santyl under silver alginate with Kerlix and Coban wraps. Overall, her wounds appear to be continuing to contract and epithelialize, with some granulation tissue present. There continues to be some slough on all wound surfaces. 08/09/2021: She has not been able to get an appointment with dermatology because apparently the offices in [facility] and [place] do not accept Medicaid. She is looking into whether or not she can be seen at the main [facility] dermatology clinic. This is necessary because plastic surgery is concerned that her wounds might represent pyoderma and they did not want to do any procedure until that was clarified. We have been using Santyl under silver alginate with Kerlix and Coban wraps. Today, there was a greater amount of drainage on her dressings with a slight green discoloration and significant odor. Despite this, her wounds continue to contract and epithelialize. There is pale granulation tissue present and actually, on the left medial ankle, the granulation tissue is a bit hypertrophic. 08/16/2021: Last week, I took a culture and this grew back rare methicillin-resistant Staph aureus and rare corynebacterium. The MRSA was sensitive  to gentamicin which we began applying topically on an empiric basis. This week, her wounds are a bit smaller and the drainage and odor are less. Her primary care provider is working on assisting the patient with a dermatology evaluation. She has been in silver alginate over the gentamicin that was started last week along with Kerlix and Coban wraps. 08/23/2021: Because she has Medicaid, we have been unable to get her into see any dermatologist in the [place] to rule out pyoderma gangrenosum, which was a requirement from plastic surgery prior to any sort of debridement and grafting. Despite this, however, all of her wounds continue to get smaller. The wound on her left medial ankle is nearly closed. There is no odor from the wounds, although she still accumulates a modest amount of drainage on her dressings. 08/30/2021: The lateral right ankle wound and the medial left ankle wound are a bit smaller today. The medial right ankle wound is about the same size. They are less tender. We have still been unable to get her into dermatology. 09/06/2021: All of the wounds are about the same size today. She continues to endorse minimal pain. I communicated with Dr. Arita Miss in plastic surgery regarding our issues getting a dermatology appointment; he was out of town but indicated that he would look into perhaps performing the biopsy in his office and will have his office contact her. 09/14/2021: The patient has an appointment in dermatology, but it is not until October. Her wounds are roughly the same; she continues to have very thick purulent-looking drainage on her dressings. 09/20/2021: The left medial  wound is nearly closed and just has a bit of accumulated eschar on the surface. The right medial and lateral ankle wounds are perhaps a little bit smaller. They continue to have a very pale surface with accumulation of thin slough. 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 I did not think Jodie Echevaria  was indicated based on this. She is getting topical mupirocin with Prisma silver collagen. 10/04/2021: The patient was not seen in clinic last week due to childcare coverage issues. In the interim, the left medial leg wound has closed. The right sided leg wounds are smaller. There is more granulation tissue coming through, particularly on the lateral wound. The surface remains somewhat gritty. We have been applying topical mupirocin and Prisma silver collagen. 10/11/2021: The left medial leg wound remains closed. She does complain of some anesthetic sensation to the area. Both of the right-sided leg wounds are smaller but still have accumulated slough. 10/18/2021: Both right-sided leg wounds are minimally smaller this week. She still continues to accumulate slough and has thick drainage on her dressings. 10/23/2021: Both wounds continue to contract. There is still slough buildup. She has been approved for a keratin-based skin substitute trial product but it will not be available until next week. 10/30/2021: The wounds are about the same to perhaps slightly smaller. There is still continued slough buildup. Unfortunately, the rep for the keratin based product did not show up today and did not answer his phone when called. 11/08/2021: The wounds are little bit smaller today. She continues to have thick drainage but the surfaces are relatively clean with just a little bit of slough accumulation. She reported to me today that she is unable to completely flex her left ankle and on examination it seems this is potentially related to scar tissue from her wounds. We do have the ProgenaMatrix trial product available for her today. 11/15/2021: Both wounds are smaller today. There is some slough accumulation on the surfaces, but the medial wound, in particular looks like it is filling in and is less deep. She did hear from physical therapy and she is going to start working with them on July 11. She is here for her second  application of the trial skin substitute, ProgenaMatrix. 11/22/2021: Both wounds continue to contract, the medial more dramatically than the lateral. Both wounds have a layer of slough on the surface, but underneath this, the gritty fibrous tissue has a little bit more of a pink cast to it rather than being as pale as it has been. 11/29/2021: The wounds are roughly the same size this week, perhaps a millimeter or 2 smaller. The medial wound has filled in and is nearly flush with the surrounding skin surface. She continues to have a lot of slough accumulation on both surfaces. 12/06/2021: No significant change to her wounds, but she has a new opening on her dorsal foot, just distal to the right lateral ankle wound. The area on her left medial ankle that reopened looks a little bit larger today. She has quite a bit of pain associated with the new wound. 12/12/2021: Her wounds look about the same but the new opening on her right lateral dorsal foot is a little bit bigger. She continues to have a fair amount of pain with this wound. 12/26/2021: The left medial ankle wound is tiny and superficial. She has 2 areas of crusting on her left lateral ankle, however, that appear to be threatening to open again. Her right medial ankle wound  is a little bit smaller today but still continues to accumulate thick rubbery slough. The new dorsal foot wound is exquisitely painful but there is no odor or purulent drainage. No erythema or induration. The right lateral ankle wound looks about the same today, again with thick rubbery slough. 01/03/2022: The left medial ankle wound has closed again. Both right ankle wounds appear to be about the same size with thick rubbery slough. The dorsal foot wound on the right continues to be quite painful and she stated that she did not want any debridement of that site today. 01/10/2022: No real change to any of her wounds. She continues to accumulate thick slough. The dorsal foot wound has  merged with the lateral malleolar wound. She is experiencing significant pain in the dorsal foot portion of the ulcer. 01/16/2022: Absolutely no change or progress in her wounds. 01/24/2022: Her wounds are unchanged. She continues to build up slough and the wounds on her dorsal right foot are still exquisitely tender. [name], [name] ([id number]) [id number] Page 14 of 18 01/31/2022: The wounds actually measure a little bit narrower today. They still have thick slough on the surface but the underlying tissue seems a little less fibrotic. We changed to Iodosorb last week. 02/07/2022: Wounds continue to slowly epithelialize around the parameters. She has less pain today. She still accumulates a fairly substantial layer of slough. 02/15/2022: No change to the wounds overall. The measured a little bit larger today per the intake nurse. She continues to have substantial slough accumulation and her pain is a little bit worse. 02/21/2022. No change at all to her wounds. I do not see that plastic surgery has received her referral yet. 02/28/2022: The wounds remain unchanged. They are dry and fibrotic with accumulation of slough and eschar. She did receive an appointment to see plastic surgery on October 23, but the office called her back and indicated they needed to reschedule and that the next available appointment was not until December. The patient became angry and decided she did not want to see this plastics group. 10/19; the patient sees Dr. Maylene Roes again next week. She is using Medihoney as the primary dressing changing this herself. 03/21/2022: She saw Dr. Ferd Hibbs at the wound care center at [facility]. Dr. Ferd Hibbs was also unable to debride her, secondary to pain and is planning to take her to the operating room for operative debridement and potential skin substitute placement. Her wounds are unchanged with thick yellow slough and a fibrotic base. She says  they are too painful to be debrided. In addition, yesterday her hemoglobin was 4 and she received 2 units packed red blood cell transfusion. 04/04/2022: Her operation at [facility] is scheduled for December 15. She continues to use Medihoney on her wounds. They are a little bit less painful today and she is willing to entertain the possibility of debridement. The wounds measured a little bit larger in all dimensions today but the layer of slough is not as thick as usual. 04/18/2022: Her wounds actually measured a little bit smaller today and the extension onto the dorsal foot from the lateral wound has healed. They are less tender and there is significantly less slough present as compared to prior visits. 05/09/2022: She had to reschedule her surgery due to lack of childcare. It is now planned for January 5. The wounds are basically unchanged, but she had a lot more drainage, which was thick and somewhat purulent in appearance. No significant odor. 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 They are quite painful today and she does not want to have debridement performed. 05/30/2022: The patient underwent surgical debridement and placement of TheraSkin to her wounds on January 5. She has been doing well and does not endorse any pain. The TheraSkin is intact and looks beautiful. It is sutured in place. There is no exudate or significant drainage. 06/04/2022: Her TheraSkin remains intact and I am starting to see granulation tissue emerging underneath the surface. No concern for infection. 06/12/2022: The TheraSkin is intact and there is more granulation tissue emerging. It is starting to look a little bit dry, however. No malodor or purulent drainage. 06/19/2022: The TheraSkin continues to look good. The edges are a bit dry, but the central portion of each of her wounds is showing a nice pink color. 06/27/2022: The TheraSkin on the lateral wound remains almost completely intact. There is nice pink  tissue peaking through the mesh of the TheraSkin. On the medial leg, it is starting to breakdown a little bit, but most of the TheraSkin remains intact here, as well. Both wounds measured smaller today. 07/03/2022: The wounds are fairly dry around the perimeter today. Some of the suture is hanging loose. 07/12/2022: Both wounds are measuring smaller today. The medial ulcer is getting a bit too dry. The lateral ulcer continues to have nice buds of granulation tissue emerging. 07/26/2022: The moisture balance on both wounds is much better today. For the first time since I began seeing her, I see actual beefy red granulation tissue on the lateral ankle wound. There are more buds of deep pink granulation tissue emerging on the medial ankle. There is some dry eschar around the edges of the medial wound and a tiny amount of slough overlying the good granulation tissue on the lateral wound. 08/09/2022: Unfortunately, she has a new wound on her left lateral lower leg, just above the ankle. It is extremely painful for her. It penetrates to the fat layer and has the same woody, fibrotic character as her previous wounds. The other wounds are both a little bit smaller with slough and some eschar around the periphery. 08/14/2022: Her right lateral ankle wound is a little bit smaller. Unfortunately, she has had an increase in her drainage and it has a purulent nature to it, similar to what was present prior to her surgical debridement. She also reports odor coming from her wounds this week. The left lateral leg wound remains extremely tender. 08/21/2022: The left lateral ankle wound is larger and more painful today. Both right ankle wounds have a little bit more vital appearance, but they have accumulated more slough. She reports less odor this week. She is currently taking Augmentin. 08/28/2022: The left lateral ankle wound continues to enlarge and remains quite painful. Both of the right ankle wounds actually look better  this week. There is improved tissue quality with less slough accumulation. 09/27/2022: The left lateral ankle wound actually looks nearly healed. She says that she has been applying some silver alginate that she had leftover. She was unable to afford the Santyl that was prescribed, but found a tube that she had on hand at home and has been using this on her right ankle wounds. These wounds look about the same and have accumulated the usual layer of slough. Unfortunately, a portion of the dorsal foot wound has reopened and is exquisitely painful today. 10/09/2022: The left lateral ankle wound is healed. The dorsal foot wound appears to have expanded and remains exquisitely  painful. The medial and lateral right ankle wounds look about the same size, but they are quite a bit cleaner. 10/23/2022: The left lateral ankle remains closed. The dorsal foot wound has expanded to the point that it now converges with the right lateral leg wound. She has accumulated fairly thick slough on all of the open surfaces. 11/06/2022: No significant change to any of her wounds, although the dorsal foot aspect of the right lateral wound is a little bit smaller. She has been approved for the [employer] and [employer] for [employer] and will be able to get a full year's supply. 11/20/2022: The dorsal foot aspect of the right lateral wound has almost completely closed. I am seeing nice pink tissue starting to emerge from the fibrotic surface of both wounds. The medial leg wound appears to be a little bit smaller today. She has been using Santyl. 12/04/2022: The dorsal foot aspect of the right lateral wound has closed. There is more pink granulation tissue emerging on her wounds. There are buds of epithelium beginning to fill in from the edges of her wound, particularly at the proximal aspect of the medial wound and the distal aspect of the lateral wound. Significantly less slough formation than in the past. 12/18/2022: The  wounds measured about the same size today, but there is better granulation tissue filling in and the overall surface consistency is improving. She does have slough accumulation on the surfaces, but no malodor or purulent drainage. 01/01/2023: No significant change to her wounds. The surface seems a little bit more fibrotic today. She is complaining of more pain. [name], [name] ([id number]) [id number] Page 15 of 18 01/15/2023: Her wounds are stable today. She has been taking Augmentin based on the culture that I took at her previous visit. She says that she has felt physically better since being on the antibiotic and has had less drainage from her wounds. 01/29/2023: The wounds are unchanged and remained fibrotic and painful. She does report having less pain while on antibiotics and less drainage, as well. 02/07/2023: Her wounds have gotten larger and more painful. She completed her course of antibiotics yesterday. She still has a fair amount of drainage coming from her wounds. 02/24/2023: Her wounds continue to enlarge. They are more painful. 03/18/2023: Her wounds continue to enlarge and become more painful. The systemic antibiotics do not seem to have made any difference this round. She is having more drainage, as well. 04/01/2023: Her wounds are about the same size today. They have a thick layer of slough on both the medial and lateral aspect. Underneath, however, the tissue is not quite as pale as it usually appears. She does have 3 new wounds on her left lower leg, 1 lateral and 2 medial. They are covered with slough and eschar and are exquisitely painful. She has been applying silver alginate to these at home. Her case was discussed in the [facility] conference recently. Several suggestions were made including Lidoderm patches, vascular referral for an angiogram, and potentially further surgery. They also suggested getting a TCOM done with the Lidoderm patch in  situ. They agreed with the decision to put her on pentoxifylline. 04/14/2023: Her wounds have worsened across the board. All of them are larger and exquisitely painful. Her insurance would not cover the Lidoderm patches. She has an appointment with vascular surgery coming up next week. 05/05/2023: Her wounds continue to deteriorate. The left leg ulcers have merged and are now almost circumferential. The right leg ulcers are larger.  They are exquisitely painful. She paid for the lidocaine patches out-of-pocket but says they do not help. She met with vascular surgery and they felt it was unlikely that there was an arterial issue, but they are going to get lower extremity arterial Doppler studies and go from there. Objective Constitutional Slightly hypertensive. no acute distress. Vitals Time Taken: 11:15 AM, Height: 67 in, Weight: 134 lbs, BMI: 21, Temperature: 98.1 F, Pulse: 88 bpm, Respiratory Rate: 18 breaths/min, Blood Pressure: 146/73 mmHg. Respiratory Normal work of breathing on room air.. General Notes: 05/05/2023: Her wounds continue to deteriorate. The left leg ulcers have merged and are now almost circumferential. The right leg ulcers are larger. They are exquisitely painful. Integumentary (Hair, Skin) Wound #17 status is Open. Original cause of wound was Gradually Appeared. The date acquired was: 10/05/2012. The wound has been in treatment 106 weeks. The wound is located on the Right,Lateral Lower Leg. The wound measures 10cm length x 7.5cm width x 0.2cm depth; 58.905cm^2 area and 11.781cm^3 volume. There is Fat Layer (Subcutaneous Tissue) exposed. There is a large amount of serosanguineous drainage noted. The wound margin is distinct with the outline attached to the wound base. There is medium (34-66%) pink granulation within the wound bed. There is a medium (34-66%) amount of necrotic tissue within the wound bed including Adherent Slough. The periwound skin appearance had no  abnormalities noted for moisture. The periwound skin appearance exhibited: Scarring, Hemosiderin Staining. The periwound skin appearance did not exhibit: Ecchymosis. Periwound temperature was noted as No Abnormality. The periwound has tenderness on palpation. Wound #21 status is Open. Original cause of wound was Gradually Appeared. The date acquired was: 06/26/2021. The wound has been in treatment 96 weeks. The wound is located on the Right,Medial Ankle. The wound measures 12.9cm length x 6cm width x 0.2cm depth; 60.79cm^2 area and 12.158cm^3 volume. There is Fat Layer (Subcutaneous Tissue) exposed. There is a large amount of serosanguineous drainage noted. The wound margin is distinct with the outline attached to the wound base. There is small (1-33%) pink granulation within the wound bed. There is a large (67-100%) amount of necrotic tissue within the wound bed including Adherent Slough. The periwound skin appearance had no abnormalities noted for moisture. The periwound skin appearance had no abnormalities noted for color. The periwound skin appearance exhibited: Scarring. Periwound temperature was noted as No Abnormality. The periwound has tenderness on palpation. Wound #26 status is Open. Original cause of wound was Gradually Appeared. The date acquired was: 03/18/2023. The wound has been in treatment 4 weeks. The wound is located on the Left,Lateral Lower Leg. The wound measures 6.4cm length x 5.9cm width x 0.1cm depth; 29.657cm^2 area and 2.966cm^3 volume. There is Fat Layer (Subcutaneous Tissue) exposed. There is a medium amount of serous drainage noted. The wound margin is distinct with the outline attached to the wound base. There is no granulation within the wound bed. There is a large (67-100%) amount of necrotic tissue within the wound bed including Adherent Slough. The periwound skin appearance had no abnormalities noted for moisture. The periwound skin appearance had no abnormalities noted  for color. The periwound skin appearance exhibited: Scarring. Periwound temperature was noted as No Abnormality. The periwound has tenderness on palpation. Wound #27 status is Open. Original cause of wound was Gradually Appeared. The date acquired was: 03/18/2023. The wound has been in treatment 4 weeks. The wound is located on the Left,Medial Lower Leg. The wound measures 3.8cm length x 6.7cm width x 0.1cm depth; 19.996cm^2  area and 2cm^3 volume. There is Fat Layer (Subcutaneous Tissue) exposed. There is a medium amount of serous drainage noted. The wound margin is distinct with the outline attached to the wound base. There is no granulation within the wound bed. There is a large (67-100%) amount of necrotic tissue within the wound bed including Adherent Slough. The periwound skin appearance had no abnormalities noted for moisture. The periwound skin appearance had no abnormalities noted for color. The periwound skin appearance exhibited: Scarring. Periwound temperature was noted as No Abnormality. The periwound has tenderness on palpation. Wound #28 status is Open. Original cause of wound was Gradually Appeared. The date acquired was: 03/18/2023. The wound has been in treatment 4 weeks. The wound is located on the Left Achilles. The wound measures 1.9cm length x 1.2cm width x 0.1cm depth; 1.791cm^2 area and 0.179cm^3 volume. There is a medium amount of serous drainage noted. Wound #29 status is Open. Original cause of wound was Gradually Appeared. The date acquired was: 05/05/2023. The wound is located on the Left,Medial Ankle. The wound measures 1.9cm length x 1.2cm width x 0.1cm depth; 1.791cm^2 area and 0.179cm^3 volume. There is Fat Layer (Subcutaneous Tissue) exposed. There is no tunneling or undermining noted. There is a medium amount of serosanguineous drainage noted. The wound margin is distinct with the outline attached to the wound base. There is no granulation within the wound bed. There  is a large (67-100%) amount of necrotic tissue within the wound bed including Eschar and Adherent Slough. The periwound skin appearance did not exhibit: Callus, Crepitus, Excoriation, Induration, Rash, Scarring, Dry/Scaly, [name] ([id number]) [id number].pdf Page 16 of 18 Maceration, Atrophie Blanche, Cyanosis, Ecchymosis, Hemosiderin Staining, Mottled, Pallor, Rubor, Erythema. Periwound temperature was noted as No Abnormality. The periwound has tenderness on palpation. 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 There was a Excisional Skin/Subcutaneous Tissue Debridement with a total area of 0.59 sq cm performed by Duanne Guess, MD. With the following instrument(s): Curette to remove Viable and Non-Viable tissue/material. Material removed includes Subcutaneous Tissue and Slough and after achieving pain control using Lidocaine. No specimens were taken. A time out was conducted at 12:02, prior to the start of the procedure. A Minimum amount of bleeding was controlled with Pressure. The procedure was tolerated well with a pain level of 0 throughout and a pain level of 0 following the procedure. Post Debridement Measurements: 10cm length x 7.5cm width x 0.2cm depth; 11.781cm^3 volume. Character of Wound/Ulcer Post Debridement is improved. Post procedure Diagnosis Wound #17: Same as Pre-Procedure Wound #21 Pre-procedure diagnosis of Wound #21 is a Sickle Cell Lesion located on the Right,Medial Ankle .Severity of Tissue Pre Debridement is: Fat layer exposed. There was a Excisional Skin/Subcutaneous Tissue Debridement with a total area of 0.61  sq cm performed by Duanne Guess, MD. With the following instrument(s): Curette to remove Viable and Non-Viable tissue/material. Material removed includes Subcutaneous Tissue and Slough and after achieving pain control using Lidocaine. No specimens were taken. A time out was conducted at 12:02, prior to the start of the procedure. A Minimum amount of bleeding was controlled with Pressure. The procedure was tolerated well with a pain level of 0 throughout and a pain level of 0 following the  procedure. Post Debridement Measurements: 12.9cm length x 6cm width x 0.2cm depth; 12.158cm^3 volume. Character of Wound/Ulcer Post Debridement is improved. Severity of Tissue Post Debridement is: Fat layer exposed. Post procedure Diagnosis Wound #21: Same as Pre-Procedure Plan Follow-up Appointments: Return Appointment in 2 weeks. - Dr. Lady Gary Anesthetic: (In clinic) Topical Lidocaine 4% applied to wound bed Bathing/ Shower/ Hygiene: May shower and wash wound with soap and water. Additional Orders / Instructions: Follow Nutritious Diet Juven Shake 1-2 times daily. Lymphedema Treatment Plan - Exercise, Compression and Elevation: Elevate legs 30 - 60 minutes at or above heart level at least 3 - 4 times daily as able/tolerated Avoid standing for long periods and elevate leg(s) parallel to the floor when sitting The following medication(s) was prescribed: lidocaine topical 5 % cream apply thin layer to wounds with dressing changes for pain control starting 05/05/2023 WOUND #17: - Lower Leg Wound Laterality: Right, Lateral Cleanser: Soap and Water Every Other Day/30 Days Discharge Instructions: May shower and wash wound with dial antibacterial soap and water prior to dressing change. Cleanser: Wound Cleanser Every Other Day/30 Days Discharge Instructions: Cleanse the wound with wound cleanser prior to applying a clean dressing using gauze sponges, not tissue or cotton balls. Peri-Wound Care: Triamcinolone  15 (g) Every Other Day/30 Days Discharge Instructions: Use triamcinolone 15 (g) as directed Peri-Wound Care: Sween Lotion (Moisturizing lotion) Every Other Day/30 Days Discharge Instructions: Apply moisturizing lotion as directed Topical: Gentamicin Every Other Day/30 Days Discharge Instructions: As directed by physician Topical: Mupirocin Ointment Every Other Day/30 Days Discharge Instructions: Apply Mupirocin (Bactroban) as instructed Prim Dressing: Santyl Ointment Every Other Day/30 Days ary Discharge Instructions: Apply nickel thick amount to wound bed as instructed Secondary Dressing: ABD Pad, 5x9 Every Other Day/30 Days Discharge Instructions: Apply over primary dressing as directed. Secondary Dressing: Woven Gauze Sponge, Non-Sterile 4x4 in Every Other Day/30 Days Discharge Instructions: Moisten with normal saline and Apply over primary dressing as directed. Secured With: Elastic Bandage 4 inch (ACE bandage) Every Other Day/30 Days Discharge Instructions: Secure with ACE bandage as directed. Secured With: [employer], 4.5x3.1 (in/yd) Every Other Day/30 Days Discharge Instructions: Secure with Kerlix as directed. WOUND #21: - Ankle Wound Laterality: Right, Medial [name] ([id number]) [phone].pdf Page 17 of 18 Cleanser: Soap and Water Every Other Day/30 Days Discharge Instructions: May shower and wash wound with dial antibacterial soap and water prior to dressing change. Cleanser: Wound Cleanser Every Other Day/30 Days Discharge Instructions: Cleanse the wound with wound cleanser prior to applying a clean dressing using gauze sponges, not tissue or cotton balls. Peri-Wound Care: Triamcinolone 15 (g) Every Other Day/30 Days Discharge Instructions: Use triamcinolone 15 (g) as directed Peri-Wound Care: Sween Lotion (Moisturizing lotion) Every Other Day/30 Days Discharge Instructions: Apply moisturizing lotion as directed Topical: Gentamicin  Every Other Day/30 Days Discharge Instructions: As directed by physician Topical: Mupirocin Ointment Every Other Day/30 Days Discharge Instructions: Apply Mupirocin (Bactroban) as instructed Prim Dressing: Santyl Ointment Every Other Day/30 Days ary Discharge Instructions: Apply nickel thick amount to wound bed as instructed Secondary Dressing: ABD Pad, 5x9 Every Other Day/30 Days Discharge Instructions: Apply over primary dressing as directed. Secondary Dressing: Woven Gauze Sponge, Non-Sterile 4x4 in Every Other Day/30 Days Discharge Instructions: Moisten with normal saline and Apply over primary dressing as directed. Secured With: Elastic Bandage 4 inch (ACE bandage) Every Other Day/30 Days Discharge Instructions: Secure with ACE bandage as directed. Secured With: [employer], 4.5x3.1 (in/yd) Every Other Day/30 Days Discharge Instructions: Secure with  Kerlix as directed. WOUND #26: - Lower Leg Wound Laterality: Left, Lateral Cleanser: Soap and Water Every Other Day/30 Days Discharge Instructions: May shower and wash wound with dial antibacterial soap and water prior to dressing change. Cleanser: Wound Cleanser Every Other Day/30 Days Discharge Instructions: Cleanse the wound with wound cleanser prior to applying a clean dressing using gauze sponges, not tissue or cotton balls. Peri-Wound Care: Triamcinolone 15 (g) Every Other Day/30 Days Discharge Instructions: Use triamcinolone 15 (g) as directed Peri-Wound Care: Sween Lotion (Moisturizing lotion) Every Other Day/30 Days Discharge Instructions: Apply moisturizing lotion as directed Topical: Gentamicin Every Other Day/30 Days Discharge Instructions: As directed by physician Topical: Mupirocin Ointment Every Other Day/30 Days Discharge Instructions: Apply Mupirocin (Bactroban) as instructed Prim Dressing: Santyl Ointment Every Other Day/30 Days ary Discharge Instructions: Apply nickel thick amount to wound bed as  instructed Secondary Dressing: ABD Pad, 5x9 Every Other Day/30 Days Discharge Instructions: Apply over primary dressing as directed. Secondary Dressing: Woven Gauze Sponge, Non-Sterile 4x4 in Every Other Day/30 Days Discharge Instructions: Moisten with normal saline and Apply over primary dressing as directed. Secured With: Elastic Bandage 4 inch (ACE bandage) Every Other Day/30 Days Discharge Instructions: Secure with ACE bandage as directed. Secured With: [employer], 4.5x3.1 (in/yd) Every Other Day/30 Days Discharge Instructions: Secure with Kerlix as directed. WOUND #27: - Lower Leg Wound Laterality: Left, Medial Cleanser: Soap and Water Every Other Day/30 Days Discharge Instructions: May shower and wash wound with dial antibacterial soap and water prior to dressing change. Cleanser: Wound Cleanser Every Other Day/30 Days Discharge Instructions: Cleanse the wound with wound cleanser prior to applying a clean dressing using gauze sponges, not tissue or cotton balls. Peri-Wound Care: Triamcinolone 15 (g) Every Other Day/30 Days Discharge Instructions: Use triamcinolone 15 (g) as directed Peri-Wound Care: Sween Lotion (Moisturizing lotion) Every Other Day/30 Days Discharge Instructions: Apply moisturizing lotion as directed Topical: Gentamicin Every Other Day/30 Days Discharge Instructions: As directed by physician Topical: Mupirocin Ointment Every Other Day/30 Days Discharge Instructions: Apply Mupirocin (Bactroban) as instructed Prim Dressing: Santyl Ointment Every Other Day/30 Days ary Discharge Instructions: Apply nickel thick amount to wound bed as instructed Secondary Dressing: ABD Pad, 5x9 Every Other Day/30 Days Discharge Instructions: Apply over primary dressing as directed. Secondary Dressing: Woven Gauze Sponge, Non-Sterile 4x4 in Every Other Day/30 Days Discharge Instructions: Moisten with normal saline and Apply over primary dressing as directed. Secured With:  Elastic Bandage 4 inch (ACE bandage) Every Other Day/30 Days Discharge Instructions: Secure with ACE bandage as directed. Secured With: [employer], 4.5x3.1 (in/yd) Every Other Day/30 Days Discharge Instructions: Secure with Kerlix as directed. WOUND #29: - Ankle Wound Laterality: Left, Medial Cleanser: Soap and Water Every Other Day/30 Days Discharge Instructions: May shower and wash wound with dial antibacterial soap and water prior to dressing change. Cleanser: Wound Cleanser Every Other Day/30 Days Discharge Instructions: Cleanse the wound with wound cleanser prior to applying a clean dressing using gauze sponges, not tissue or cotton balls. Peri-Wound Care: Triamcinolone 15 (g) Every Other Day/30 Days Discharge Instructions: Use triamcinolone 15 (g) as directed Peri-Wound Care: Sween Lotion (Moisturizing lotion) Every Other Day/30 Days Discharge Instructions: Apply moisturizing lotion as directed Topical: Gentamicin Every Other Day/30 Days Discharge Instructions: As directed by physician Topical: Mupirocin Ointment Every Other Day/30 Days Discharge Instructions: Apply Mupirocin (Bactroban) as instructed Prim Dressing: Santyl Ointment Every Other Day/30 Days ary Discharge Instructions: Apply nickel thick amount to wound bed as instructed Secondary Dressing: ABD Pad, 5x9 Every Other  Day/30 Days Discharge Instructions: Apply over primary dressing as directed. Secondary Dressing: Woven Gauze Sponge, Non-Sterile 4x4 in Every Other Day/30 Days Discharge Instructions: Moisten with normal saline and Apply over primary dressing as directed. Secured With: Elastic Bandage 4 inch (ACE bandage) Every Other Day/30 Days Discharge Instructions: Secure with ACE bandage as directed. Secured With: [employer], 4.5x3.1 (in/yd) Every Other Day/30 Days Discharge Instructions: Secure with Kerlix as directed. ERYNN, RECHNER ([id number]) [id number] Page 18 of  18 05/05/2023: Her wounds continue to deteriorate. The left leg ulcers have merged and are now almost circumferential. The right leg ulcers are larger. They are exquisitely painful. She paid for the lidocaine patches out-of-pocket but says they do not help. She met with vascular surgery and they felt it was unlikely that there was an arterial issue, but they are going to get lower extremity arterial Doppler studies and go from there. She only permitted me to debride a tiny portion of each of the right lower leg wounds and did not allow me to touch the left leg wounds. She did ask for some prescription lidocaine ointment that she could use to help with pain at home, as the patches are not working. She continues on Pentoxil filing. She says she is using the nitroglycerin patches but did not understand that they were not to help with pain but to try to improve cutaneous blood flow. She remains firm that she does not want to pursue any surgical interventions and we are a little bit stuck at this point. We are in a palliative phase of treatment. She will continue Santyl with topical gentamicin and mupirocin, in addition to the lidocaine cream that I have prescribed. Follow-up in 2 weeks. Electronic Signature(s) Signed: 05/05/2023 12:43:55 PM By: Duanne Guess MD FACS Entered By: Duanne Guess on 05/05/2023 09:30:16 -------------------------------------------------------------------------------- SuperBill Details Patient Name: Date of Service: [name], [name] 05/05/2023 Medical Record Number: [id number] Patient Account Number: [id number] Date of Birth/Sex: Treating RN: [birth year] (52 y.o. F) Primary Care Provider: Julianne [name] Other Clinician: Referring Provider: Treating Provider/Extender: Koleen Nimrod Weeks in Treatment: 106 Diagnosis Coding ICD-10 Codes Code Description L97.818 Non-pressure chronic ulcer of other part of right lower leg with other specified  severity L97.828 Non-pressure chronic ulcer of other part of left lower leg with other specified severity D57.1 Sickle-cell disease without crisis Facility Procedures : CPT4 Code: 78295621 Description: 30865 - WOUND CARE VISIT-LEV 5 EST PT Modifier: Quantity: 1 : CPT4 Code: 78469629 Description: 11042 - DEB SUBQ TISSUE 20 SQ CM/< ICD-10 Diagnosis Description L97.818 Non-pressure chronic ulcer of other part of right lower leg with other specified Modifier: severity Quantity: 1 Physician Procedures : CPT4 Code Description Modifier [id number] 99214 - WC PHYS LEVEL 4 - EST PT ICD-10 Diagnosis Description L97.818 Non-pressure chronic ulcer of other part of right lower leg with other specified severity L97.828 Non-pressure chronic ulcer of other part of  left lower leg with other specified severity D57.1 Sickle-cell disease without crisis Quantity: 1 : 4401027 11042 - WC PHYS SUBQ TISS 20 SQ CM ICD-10 Diagnosis Description L97.818 Non-pressure chronic ulcer of other part of right lower leg with other specified severity Quantity: 1 Electronic Signature(s) Signed: 05/05/2023 12:36:11 PM By: Fonnie Mu RN Signed: 05/05/2023 12:43:55 PM By: Duanne Guess MD FACS Entered By: Fonnie Mu on 05/05/2023 09:36:10

## 2023-05-11 ENCOUNTER — Other Ambulatory Visit: Payer: Self-pay | Admitting: Family Medicine

## 2023-05-11 DIAGNOSIS — G894 Chronic pain syndrome: Secondary | ICD-10-CM

## 2023-05-16 ENCOUNTER — Telehealth: Payer: Self-pay

## 2023-05-16 NOTE — Telephone Encounter (Signed)
Copied from CRM [id number]. Topic: Clinical - Medication Refill >> May 16, 2023 11:11 AM Adelina Mings wrote: Most Recent Primary Care Visit:  Provider: Massie Maroon  Department: [facility]  Visit Type: OFFICE VISIT  Date: 04/01/2023  Medication: Oxycodone HCl 10 MG TABS   Has the patient contacted their pharmacy? Yes (Agent: If no, request that the patient contact the pharmacy for the refill. If patient does not wish to contact the pharmacy document the reason why and proceed with request.) (Agent: If yes, when and what did the pharmacy advise?)  Is this the correct pharmacy for this prescription? Yes If no, delete pharmacy and type the correct one.  This is the patient's preferred pharmacy:  [facility] #[id number] [facility], [place] - [address] AT [facility] [address] [place] [address] Phone: [phone] Fax: [phone]  [facility] [address] [address] Phone: [phone] Fax: [phone]  [facility] [address] [place] - [address] [place] [address] Phone: [phone] Fax: [phone]   Has the prescription been filled recently? Yes  Is the patient out of the medication? Yes  Has the patient been seen for an appointment in the last year OR does the patient have an upcoming appointment? Yes  Can we respond through MyChart? No  Agent: Please be advised that Rx refills may take up to 3 business days. We ask that you follow-up with your pharmacy.

## 2023-05-19 ENCOUNTER — Telehealth: Payer: Self-pay

## 2023-05-19 ENCOUNTER — Ambulatory Visit (HOSPITAL_BASED_OUTPATIENT_CLINIC_OR_DEPARTMENT_OTHER): Payer: Medicaid Other | Admitting: General Surgery

## 2023-05-19 NOTE — Telephone Encounter (Signed)
Copied from CRM [id number]. Topic: Clinical - Prescription Issue >> May 19, 2023 10:32 AM Dimitri Ped wrote: Reason for CRM: patient is calling about a refill request she called about Friday and the pharmacy say they still dont have anything . Please give patient a call concerning this prescription Oxycodone HCl 10 MG TABS   [phone] call back number

## 2023-05-20 ENCOUNTER — Telehealth: Payer: Self-pay

## 2023-05-20 ENCOUNTER — Other Ambulatory Visit: Payer: Self-pay | Admitting: Nurse Practitioner

## 2023-05-20 DIAGNOSIS — G894 Chronic pain syndrome: Secondary | ICD-10-CM

## 2023-05-20 MED ORDER — OXYCODONE HCL 10 MG PO TABS: 10.0000 mg | ORAL_TABLET | ORAL | 0 refills | Status: DC

## 2023-05-20 NOTE — Telephone Encounter (Signed)
Advised 

## 2023-05-20 NOTE — Progress Notes (Signed)
 Reviewed PDMP substance reporting system prior to prescribing opiate medications. No inconsistencies noted.  1. Chronic pain syndrome  - Oxycodone  HCl 10 MG TABS; Take 1 tablet (10 mg total) by mouth every 4 (four) hours while awake for 15 days.  Dispense: 90 tablet; Refill: 0

## 2023-05-20 NOTE — Telephone Encounter (Signed)
 Copied from CRM [id number]. Topic: Clinical - Prescription Issue >> May 19, 2023  3:15 PM Deleta HERO wrote: Reason for CRM: PT [facility]CI 10 MG Tabs refill that was requested on 12/27, but they have not received an update, PT states she is out and needs refill as soon as possible for her pain.   Callback #: [phone]

## 2023-05-22 ENCOUNTER — Other Ambulatory Visit: Payer: Self-pay

## 2023-05-22 ENCOUNTER — Telehealth: Payer: Self-pay

## 2023-05-22 NOTE — Telephone Encounter (Signed)
 Pharmacy Patient Advocate Encounter  Received notification from [facility] that Prior Authorization for OXYCODONE  10MG  has been APPROVED from 05/22/2023 to 11/18/2023   PA #/Case ID/Reference #: [id number]  [facility] NOTIFIED, PHARMACIST STATED PATIENT PICKED UP MEDICATION ON 05/20/2023.

## 2023-05-29 ENCOUNTER — Other Ambulatory Visit: Payer: Self-pay | Admitting: Family Medicine

## 2023-05-29 DIAGNOSIS — G894 Chronic pain syndrome: Secondary | ICD-10-CM

## 2023-05-29 NOTE — Progress Notes (Signed)
 Patient ID: [name], female   DOB: 28-Sep-1970, 53 y.o.   MRN: [id number]  Reason for Consult: Follow-up and PAD   Referred by Tilford Bertram HERO, FNP  Subjective:     HPI  [name] is a 53 y.o. female with a history of sickle cell disease who is presenting for follow up of 5-6 year history of bilateral lower extremity wounds. She has been being treated by the wound care center since last seen in clinic.  She has been attending her wound care appointments every 2 weeks but reports that her wounds have not improved much.  She has significant pain in the wounds but denies claudication, or rest pain.   Past Medical History:  Diagnosis Date   Anemia 03/30/2012   Hx of sickle cell disease   CAP (community acquired pneumonia)    2014   Cholelithiasis 03/30/2012   Chronic wound of extremity 04/01/2012   Elevated LFTs    Leg ulcer (HCC) 10/27/2012   Chronic under care of wound clinic   Multiple open wounds of lower extremity    chronic wounds B/LLE   Peripheral arterial disease (HCC)    Sickle cell disease (HCC)    Sinus bradycardia by electrocardiogram 04/03/2012   Family History  Problem Relation Age of Onset   Sickle cell anemia Sister    Diabetes Mother    Asthma Mother    Sickle cell trait Mother    Sickle cell trait Father    Hypertension Father    Past Surgical History:  Procedure Laterality Date   ALLOGRAFT APPLICATION Right 02/14/2015   Procedure: SURGICAL PREP FOR GRAFTING RIGHT LOWER EXTREMITY AND APPLICATION OF THERASKIN;  Surgeon: Earlis Ranks, MD;  Location: [facility];  Service: Plastics;  Laterality: Right;   APPLICATION OF A-CELL OF EXTREMITY Right 10/09/2015   Procedure: APPLICATION OF THERASKIN;  Surgeon: Earlis Ranks, MD;  Location: [facility];  Service: Plastics;  Laterality: Right;   BREAST SURGERY     left breast cyst aspiration   CESAREAN SECTION     CESAREAN SECTION N/A 01/06/2014   Procedure: CESAREAN  SECTION;  Surgeon: Harland JAYSON Birkenhead, MD;  Location: [facility];  Service: Obstetrics;  Laterality: N/A;   CESAREAN SECTION N/A 05/04/2017   Procedure: CESAREAN SECTION;  Surgeon: Eveline Lynwood MATSU, MD;  Location: [facility];  Service: Obstetrics;  Laterality: N/A;   I & D EXTREMITY Right 10/09/2015   Procedure: SURGICAL PREPARATION FOR GRAFTING RIGHT ANKLE AND APPLICATION THERASKIN;  Surgeon: Earlis Ranks, MD;  Location: [facility];  Service: Plastics;  Laterality: Right;   SKIN FULL THICKNESS GRAFT Bilateral 06/17/2016   Procedure: SURGICAL PREP FOR GRAFTING, BILATERAL LOWER EXTREMITIES AND APPLICATION OF THERASKIN;  Surgeon: Earlis Ranks, MD;  Location: [facility];  Service: Plastics;  Laterality: Bilateral;   TONSILLECTOMY      Short Social History:  Social History   Tobacco Use   Smoking status: Never    Passive exposure: Never   Smokeless tobacco: Never  Substance Use Topics   Alcohol use: No    No Known Allergies  Current Outpatient Medications  Medication Sig Dispense Refill   ASPIRIN  LOW DOSE 81 MG tablet TAKE 1 TABLET(81 MG) BY MOUTH DAILY 30 tablet 12   folic acid  (FOLVITE ) 1 MG tablet Take 5 tablets (5 mg total) by mouth daily. (Patient taking differently: Take 1 mg by mouth daily.) 150 tablet 11   gabapentin  (NEURONTIN ) 300 MG capsule TAKE 1 CAPSULE(300 MG)  BY MOUTH THREE TIMES DAILY 90 capsule 2   ibuprofen  (ADVIL ) 600 MG tablet TAKE 1 TABLET(600 MG) BY MOUTH EVERY 8 HOURS AS NEEDED 30 tablet 5   Oxycodone  HCl 10 MG TABS Take 1 tablet (10 mg total) by mouth every 4 (four) hours while awake for 15 days. 90 tablet 0   polyethylene glycol powder (GLYCOLAX /MIRALAX ) 17 GM/SCOOP powder Take 17 g by mouth 2 (two) times daily as needed. (Patient taking differently: Take 17 g by mouth 2 (two) times daily as needed for mild constipation.) 3350 g 1   Deferiprone  500 MG TABS Take 500 mg by mouth daily at 12 noon. (Patient not taking: Reported on 04/01/2023) 90 tablet 2    Nutritional Supplements (JUVEN) POWD Take 1 each by mouth 3 (three) times daily between meals. (Patient not taking: Reported on 04/01/2023) 545 g 6   OXBRYTA  500 MG TABS tablet TAKE 2 TABLETS BY MOUTH EVERY DAY (Patient not taking: Reported on 05/30/2023) 90 tablet 1   Vitamin D , Ergocalciferol , (DRISDOL ) 1.25 MG (50000 UNIT) CAPS capsule Take 1 capsule (50,000 Units total) by mouth every 7 (seven) days. (Patient not taking: Reported on 05/30/2023) 5 capsule 2   No current facility-administered medications for this visit.    REVIEW OF SYSTEMS   All other systems were reviewed and are negative     Objective:  Objective   Vitals:   05/30/23 1035  BP: (!) 118/58  Pulse: 64  Resp: 18  Temp: 97.7 F (36.5 C)  TempSrc: Temporal  SpO2: 91%  Weight: 123 lb 3.2 oz (55.9 kg)  Height: 5' 7 (1.702 m)   Body mass index is 19.3 kg/m.  Physical Exam General: no acute distress Cardiac: hemodynamically stable, nontachycardic Pulm: normal work of breathing Neuro: alert, no focal deficit Extremities: Bilateral ankle dressings clean and dry, no edema, dorsum right foot is discolored but no wounds on feet or toes    Data: Bilateral LE duplex +-----------+--------+-----+---------------+---------+--------+  RIGHT     PSV cm/sRatioStenosis       Waveform Comments  +-----------+--------+-----+---------------+---------+--------+  CFA Mid    204          50-74% stenosistriphasic          +-----------+--------+-----+---------------+---------+--------+  DFA       114                         triphasic          +-----------+--------+-----+---------------+---------+--------+  SFA Prox   165                         triphasic          +-----------+--------+-----+---------------+---------+--------+  SFA Mid    164                         triphasic          +-----------+--------+-----+---------------+---------+--------+  SFA Distal 151                          triphasic          +-----------+--------+-----+---------------+---------+--------+  POP Prox   117                         triphasic          +-----------+--------+-----+---------------+---------+--------+  POP Distal 145  triphasic          +-----------+--------+-----+---------------+---------+--------+  ATA Distal 54                          triphasic          +-----------+--------+-----+---------------+---------+--------+  PTA Distal 99                          triphasic          +-----------+--------+-----+---------------+---------+--------+  PERO Distal122                         triphasic          +-----------+--------+-----+---------------+---------+--------+   +-----------+--------+-----+---------------+---------+--------+  LEFT      PSV cm/sRatioStenosis       Waveform Comments  +-----------+--------+-----+---------------+---------+--------+  CFA Mid    234          50-74% stenosistriphasic          +-----------+--------+-----+---------------+---------+--------+  DFA       150                         triphasic          +-----------+--------+-----+---------------+---------+--------+  SFA Prox   162                         triphasic          +-----------+--------+-----+---------------+---------+--------+  SFA Mid    167                         triphasic          +-----------+--------+-----+---------------+---------+--------+  SFA Distal 149                         triphasic          +-----------+--------+-----+---------------+---------+--------+  POP Prox   118                         triphasic          +-----------+--------+-----+---------------+---------+--------+  POP Distal 125                         triphasic          +-----------+--------+-----+---------------+---------+--------+  ATA Distal 130                         triphasic           +-----------+--------+-----+---------------+---------+--------+  PTA Distal 100                         triphasic          +-----------+--------+-----+---------------+---------+--------+  PERO Distal65                          triphasic          +-----------+--------+-----+---------------+---------+--------+      Assessment/Plan:     [name] is a 53 y.o. female with sickle cell anemia and longstanding bilateral chronic ankle wounds. I explained that given her pulse exam and [name] it is unlikely that she has any arterial insufficiency although given how long the wounds have been present with adequate wound  care. Last time we discussed we should perform further evaluation of her perfusion.  Today I explained that she has normal flow throughout lower extremities and adequate perfusion to heal.  Continue with local wound care with wound care center and follow-up as needed     Norman GORMAN Serve MD [facility]

## 2023-05-30 ENCOUNTER — Encounter: Payer: Self-pay | Admitting: Vascular Surgery

## 2023-05-30 ENCOUNTER — Ambulatory Visit (INDEPENDENT_AMBULATORY_CARE_PROVIDER_SITE_OTHER): Payer: Medicaid Other | Admitting: Vascular Surgery

## 2023-05-30 ENCOUNTER — Ambulatory Visit (HOSPITAL_COMMUNITY)
Admission: RE | Admit: 2023-05-30 | Discharge: 2023-05-30 | Disposition: A | Discharge status: Home / Self Care | Payer: Medicaid Other | Source: Ambulatory Visit | Attending: Vascular Surgery | Admitting: Vascular Surgery

## 2023-05-30 VITALS — BP 118/58 | HR 64 | Temp 97.7°F | Resp 18 | Ht 67.0 in | Wt 123.2 lb

## 2023-05-30 DIAGNOSIS — S81809D Unspecified open wound, unspecified lower leg, subsequent encounter: Secondary | ICD-10-CM

## 2023-05-30 DIAGNOSIS — S91309A Unspecified open wound, unspecified foot, initial encounter: Secondary | ICD-10-CM | POA: Diagnosis present

## 2023-05-30 DIAGNOSIS — S81009D Unspecified open wound, unspecified knee, subsequent encounter: Secondary | ICD-10-CM

## 2023-05-30 DIAGNOSIS — I739 Peripheral vascular disease, unspecified: Secondary | ICD-10-CM | POA: Diagnosis not present

## 2023-05-30 DIAGNOSIS — S91009D Unspecified open wound, unspecified ankle, subsequent encounter: Secondary | ICD-10-CM | POA: Diagnosis not present

## 2023-06-03 ENCOUNTER — Other Ambulatory Visit: Payer: Self-pay | Admitting: Family Medicine

## 2023-06-03 DIAGNOSIS — G894 Chronic pain syndrome: Secondary | ICD-10-CM

## 2023-06-05 ENCOUNTER — Other Ambulatory Visit: Payer: Self-pay | Admitting: Family Medicine

## 2023-06-05 NOTE — Telephone Encounter (Signed)
Copied from CRM [id number]. Topic: Clinical - Medication Refill >> Jun 05, 2023 12:00 PM Geneva B wrote: Most Recent Primary Care Visit:  Provider: Massie Maroon  Department: [facility]  Visit Type: OFFICE VISIT  Date: 04/01/2023  Medication: oxycodone 10 mg  Has the patient contacted their pharmacy? Yes (Agent: If no, request that the patient contact the pharmacy for the refill. If patient does not wish to contact the pharmacy document the reason why and proceed with request.) (Agent: If yes, when and what did the pharmacy advise?)   Is this the correct pharmacy for this prescription? Yes If no, delete pharmacy and type the correct one.  This is the patient's preferred pharmacy:  [facility] #[id number] [facility], [place] - [address] AT [facility] [address] [place] [address] Phone: [phone] Fax: [phone]    Has the prescription been filled recently? Yes  Is the patient out of the medication? Yes  Has the patient been seen for an appointment in the last year OR does the patient have an upcoming appointment? yes  Can we respond through MyChart? No  Agent: Please be advised that Rx refills may take up to 3 business days. We ask that you follow-up with your pharmacy.

## 2023-06-05 NOTE — Telephone Encounter (Signed)
Copied from CRM [id number]. Topic: Clinical - Medication Refill >> Jun 05, 2023 12:00 PM Geneva B wrote: Most Recent Primary Care Visit:  Provider: Massie Maroon  Department: [facility]  Visit Type: OFFICE VISIT  Date: 04/01/2023  Medication: ***  Has the patient contacted their pharmacy?  (Agent: If no, request that the patient contact the pharmacy for the refill. If patient does not wish to contact the pharmacy document the reason why and proceed with request.) (Agent: If yes, when and what did the pharmacy advise?)  Is this the correct pharmacy for this prescription?  If no, delete pharmacy and type the correct one.  This is the patient's preferred pharmacy:  [facility] #[id number] [facility], [place] - [address] AT [facility] [address] [place] [address] Phone: [phone] Fax: [phone]  [place] [address] [address] Phone: [phone] Fax: [phone]  [facility] [address] [place] - [address] [place] [address] Phone: [phone] Fax: [phone]   Has the prescription been filled recently?   Is the patient out of the medication?   Has the patient been seen for an appointment in the last year OR does the patient have an upcoming appointment?   Can we respond through MyChart?   Agent: Please be advised that Rx refills may take up to 3 business days. We ask that you follow-up with your pharmacy.

## 2023-06-05 NOTE — Telephone Encounter (Signed)
Copied from CRM [id number]. Topic: Clinical - Medication Refill >> Jun 05, 2023  2:29 PM Zane Herald wrote: Most Recent Primary Care Visit:  Provider: Massie Maroon  Department: [facility]  Visit Type: OFFICE VISIT  Date: 04/01/2023  Medication: Oxycodone HCl 10 MG TABS   Has the patient contacted their pharmacy? Yes (Agent: If no, request that the patient contact the pharmacy for the refill. If patient does not wish to contact the pharmacy document the reason why and proceed with request.) (Agent: If yes, when and what did the pharmacy advise?)  Is this the correct pharmacy for this prescription? Yes If no, delete pharmacy and type the correct one.  This is the patient's preferred pharmacy:  [facility] #[id number] [facility], [place] - [address] AT [facility] [address] [place] [address] Phone: [phone] Fax: [phone]     Has the prescription been filled recently? Yes  Is the patient out of the medication? Yes  Has the patient been seen for an appointment in the last year OR does the patient have an upcoming appointment? Yes  Can we respond through MyChart? Yes  Agent: Please be advised that Rx refills may take up to 3 business days. We ask that you follow-up with your pharmacy.

## 2023-06-05 NOTE — Telephone Encounter (Signed)
Copied from CRM [id number]. Topic: Clinical - Medication Refill >> Jun 05, 2023 11:59 AM Louie Casa B wrote: Most Recent Primary Care Visit:  Provider: Massie Maroon  Department: [facility]  Visit Type: OFFICE VISIT  Date: 04/01/2023  Medication: oxycodone 10 mg  Has the patient contacted their pharmacy? Yes (Agent: If no, request that the patient contact the pharmacy for the refill. If patient does not wish to contact the pharmacy document the reason why and proceed with request.) (Agent: If yes, when and what did the pharmacy advise?)  Is this the correct pharmacy for this prescription? Yes If no, delete pharmacy and type the correct one.  This is the patient's preferred pharmacy:  [facility] #[id number] [facility], [place] - [address] AT [facility] [address] [place] [address] Phone: [phone] Fax: [phone]      Has the prescription been filled recently? Yes  Is the patient out of the medication? Yes  Has the patient been seen for an appointment in the last year OR does the patient have an upcoming appointment? Yes  Can we respond through MyChart? No  Agent: Please be advised that Rx refills may take up to 3 business days. We ask that you follow-up with your pharmacy.

## 2023-06-06 ENCOUNTER — Other Ambulatory Visit: Payer: Self-pay | Admitting: Family Medicine

## 2023-06-06 ENCOUNTER — Other Ambulatory Visit: Payer: Self-pay | Admitting: Nurse Practitioner

## 2023-06-06 DIAGNOSIS — D571 Sickle-cell disease without crisis: Secondary | ICD-10-CM

## 2023-06-06 DIAGNOSIS — G894 Chronic pain syndrome: Secondary | ICD-10-CM

## 2023-06-06 DIAGNOSIS — D5700 Hb-SS disease with crisis, unspecified: Secondary | ICD-10-CM

## 2023-06-06 MED ORDER — OXYCODONE HCL 10 MG PO TABS: 10.0000 mg | ORAL_TABLET | ORAL | 0 refills | Status: DC | PRN

## 2023-06-06 NOTE — Telephone Encounter (Signed)
I think [place] has filled pt oxy please advise on her Ibuprofen Thanks [facility]

## 2023-06-06 NOTE — Telephone Encounter (Signed)
Please advise KH 

## 2023-06-06 NOTE — Progress Notes (Signed)
Reviewed PDMP substance reporting system prior to prescribing opiate medications. No inconsistencies noted.   1. Chronic pain syndrome  - Oxycodone HCl 10 MG TABS; Take 1 tablet (10 mg total) by mouth every 4 (four) hours as needed for up to 15 days.  Dispense: 90 tablet; Refill: 0  2. Sickle-cell disease with pain (HCC) (Primary)  - Oxycodone HCl 10 MG TABS; Take 1 tablet (10 mg total) by mouth every 4 (four) hours as needed for up to 15 days.  Dispense: 90 tablet; Refill: 0

## 2023-06-06 NOTE — Telephone Encounter (Signed)
Please advise . It looks like it was filled if so please refuse. Thank you.   Renelda Loma RMA

## 2023-06-09 ENCOUNTER — Encounter (HOSPITAL_BASED_OUTPATIENT_CLINIC_OR_DEPARTMENT_OTHER): Payer: Medicaid Other | Attending: General Surgery | Admitting: General Surgery

## 2023-06-09 DIAGNOSIS — L97828 Non-pressure chronic ulcer of other part of left lower leg with other specified severity: Secondary | ICD-10-CM | POA: Diagnosis not present

## 2023-06-09 DIAGNOSIS — I872 Venous insufficiency (chronic) (peripheral): Secondary | ICD-10-CM | POA: Diagnosis not present

## 2023-06-09 DIAGNOSIS — L97818 Non-pressure chronic ulcer of other part of right lower leg with other specified severity: Secondary | ICD-10-CM | POA: Diagnosis present

## 2023-06-09 DIAGNOSIS — D571 Sickle-cell disease without crisis: Secondary | ICD-10-CM | POA: Diagnosis not present

## 2023-06-09 DIAGNOSIS — I82531 Chronic embolism and thrombosis of right popliteal vein: Secondary | ICD-10-CM | POA: Diagnosis not present

## 2023-06-09 NOTE — Progress Notes (Signed)
PEGGYANN, LOCH ([id number]) [id number] Page 1 of 16 Visit Report for 06/09/2023 Arrival Information Details Patient Name: Date of Service: [name] [name] 06/09/2023 9:15 A M Medical Record Number: [id number] Patient Account Number: [id number] Date of Birth/Sex: Treating RN: 1970/07/25 (53 y.o. F) Primary Care Logyn Kendrick: Julianne Handler Other Clinician: Referring Muhanad Torosyan: Treating Cyrstal Leitz/Extender: Koleen Nimrod Weeks in Treatment: 111 Visit Information History Since Last Visit Added or deleted any medications: No Patient Arrived: Ambulatory Any new allergies or adverse reactions: No Arrival Time: 09:16 Had a fall or experienced change in No Accompanied By: self activities of daily living that may affect Transfer Assistance: None risk of falls: Patient Identification Verified: Yes Signs or symptoms of abuse/neglect since last visito No Secondary Verification Process Completed: Yes Hospitalized since last visit: No Patient Requires Transmission-Based Precautions: No Implantable device outside of the clinic excluding No Patient Has Alerts: No cellular tissue based products placed in the center since last visit: Pain Present Now: Yes Electronic Signature(s) Signed: 06/09/2023 10:11:50 AM By: Dayton Scrape Entered By: Dayton Scrape on 06/09/2023 09:16:44 -------------------------------------------------------------------------------- Clinic Level of Care Assessment Details Patient Name: Date of Service: [name] [name] 06/09/2023 9:15 A M Medical Record Number: [id number] Patient Account Number: [id number] Date of Birth/Sex: Treating RN: September 10, 1970 (53 y.o. Katrinka Blazing Primary Care Mackenze Grandison: Julianne Handler Other Clinician: Referring Kamari Bilek: Treating Markie Heffernan/Extender: Koleen Nimrod Weeks in Treatment: 111 Clinic Level of Care Assessment Items TOOL 4 Quantity Score X- 1 0 Use when only an EandM is  performed on FOLLOW-UP visit ASSESSMENTS - Nursing Assessment / Reassessment X- 1 10 Reassessment of Co-morbidities (includes updates in patient status) X- 1 5 Reassessment of Adherence to Treatment Plan ASSESSMENTS - Wound and Skin A ssessment / Reassessment []  - 0 Simple Wound Assessment / Reassessment - one wound X- 5 5 Complex Wound Assessment / Reassessment - multiple wounds []  - 0 Dermatologic / Skin Assessment (not related to wound area) ASSESSMENTS - Focused Assessment X- 1 5 Circumferential Edema Measurements - multi extremities []  - 0 Nutritional Assessment / Counseling / Intervention []  - 0 Lower Extremity Assessment (monofilament, tuning fork, pulses) Knittle, Caeley ([id number]) [id number] Page 2 of 16 []  - 0 Peripheral Arterial Disease Assessment (using hand held doppler) ASSESSMENTS - Ostomy and/or Continence Assessment and Care []  - 0 Incontinence Assessment and Management []  - 0 Ostomy Care Assessment and Management (repouching, etc.) PROCESS - Coordination of Care []  - 0 Simple Patient / Family Education for ongoing care X- 1 20 Complex (extensive) Patient / Family Education for ongoing care X- 1 10 Staff obtains Chiropractor, Records, T Results / Process Orders est X- 1 10 Staff telephones HHA, Nursing Homes / Clarify orders / etc []  - 0 Routine Transfer to another Facility (non-emergent condition) []  - 0 Routine Hospital Admission (non-emergent condition) []  - 0 New Admissions / Manufacturing engineer / Ordering NPWT Apligraf, etc. , []  - 0 Emergency Hospital Admission (emergent condition) X- 1 10 Simple Discharge Coordination []  - 0 Complex (extensive) Discharge Coordination PROCESS - Special Needs []  - 0 Pediatric / Minor Patient Management []  - 0 Isolation Patient Management []  - 0 Hearing / Language / Visual special needs []  - 0 Assessment of Community assistance (transportation, D/C planning, etc.) []  -  0 Additional assistance / Altered mentation []  - 0 Support Surface(s) Assessment (bed, cushion, seat, etc.) INTERVENTIONS - Wound Cleansing / Measurement []  - 0 Simple Wound Cleansing - one wound X- 5 5 Complex Wound Cleansing -  multiple wounds X- 1 5 Wound Imaging (photographs - any number of wounds) []  - 0 Wound Tracing (instead of photographs) []  - 0 Simple Wound Measurement - one wound X- 5 5 Complex Wound Measurement - multiple wounds INTERVENTIONS - Wound Dressings []  - 0 Small Wound Dressing one or multiple wounds []  - 0 Medium Wound Dressing one or multiple wounds []  - 0 Large Wound Dressing one or multiple wounds []  - 0 Application of Medications - topical []  - 0 Application of Medications - injection INTERVENTIONS - Miscellaneous []  - 0 External ear exam []  - 0 Specimen Collection (cultures, biopsies, blood, body fluids, etc.) []  - 0 Specimen(s) / Culture(s) sent or taken to Lab for analysis []  - 0 Patient Transfer (multiple staff / Nurse, adult / Similar devices) []  - 0 Simple Staple / Suture removal (25 or less) []  - 0 Complex Staple / Suture removal (26 or more) []  - 0 Hypo / Hyperglycemic Management (close monitor of Blood Glucose) []  - 0 Ankle / Brachial Index ([name]) - do not check if billed separately [name] ([id number]) [id number] Page 3 of 16 X- 1 5 Vital Signs Has the patient been seen at the hospital within the last three years: Yes Total Score: 155 Level Of Care: New/Established - Level 4 Electronic Signature(s) Signed: 06/09/2023 5:36:52 PM By: Karie Schwalbe RN Entered By: Karie Schwalbe on 06/09/2023 17:34:31 -------------------------------------------------------------------------------- Encounter Discharge Information Details Patient Name: Date of Service: [name], [name] 06/09/2023 9:15 A M Medical Record Number: [id number] Patient Account Number: [id number] Date of Birth/Sex: Treating  RN: [birth year] (53 y.o. Katrinka Blazing Primary Care Federico Maiorino: Julianne Handler Other Clinician: Referring Malisa Ruggiero: Treating Demon Volante/Extender: Koleen Nimrod Weeks in Treatment: 111 Encounter Discharge Information Items Discharge Condition: Stable Ambulatory Status: Cane Discharge Destination: Home Transportation: Private Auto Accompanied By: self Schedule Follow-up Appointment: Yes Clinical Summary of Care: Patient Declined Electronic Signature(s) Signed: 06/09/2023 5:36:52 PM By: Karie Schwalbe RN Entered By: Karie Schwalbe on 06/09/2023 17:35:32 -------------------------------------------------------------------------------- Lower Extremity Assessment Details Patient Name: Date of Service: [name] [name] 06/09/2023 9:15 A M Medical Record Number: [id number] Patient Account Number: [id number] Date of Birth/Sex: Treating RN: [birth year] (53 y.o. Katrinka Blazing Primary Care Dilia Alemany: Julianne Handler Other Clinician: Referring Oden Lindaman: Treating Carlyon Nolasco/Extender: Koleen Nimrod Weeks in Treatment: 111 Edema Assessment Assessed: [Left: No] [Right: No] Edema: [Left: No] [Right: No] Calf Left: Right: Point of Measurement: 33 cm From Medial Instep 27.5 cm 29.5 cm Ankle Left: Right: Point of Measurement: 10 cm From Medial Instep 17.5 cm 21 cm Vascular Assessment Pulses: Dilworth, Felecia Shelling ([id number]) [Right:[id number] Page 4 of 16] Dorsalis Pedis Palpable: [Left:Yes] [Right:Yes] Extremity colors, hair growth, and conditions: Extremity Color: [Left:Normal] [Right:Normal] Hair Growth on Extremity: [Left:No] [Right:No] Temperature of Extremity: [Left:Warm] [Right:Warm] Capillary Refill: [Left:< 3 seconds] [Right:< 3 seconds] Dependent Rubor: [Left:No No] [Right:No No] Electronic Signature(s) Signed: 06/09/2023 5:36:52 PM By: Karie Schwalbe RN Entered By: Karie Schwalbe on 06/09/2023  09:47:42 -------------------------------------------------------------------------------- Multi Wound Chart Details Patient Name: Date of Service: [name], [name] 06/09/2023 9:15 A M Medical Record Number: [id number] Patient Account Number: [id number] Date of Birth/Sex: Treating RN: May 09, 1971 (53 y.o. F) Primary Care Lucine Bilski: Julianne Handler Other Clinician: Referring Stephanye Finnicum: Treating Keera Altidor/Extender: Koleen Nimrod Weeks in Treatment: 111 Vital Signs Height(in): 67 Pulse(bpm): 102 Weight(lbs): 134 Blood Pressure(mmHg): 142/65 Body Mass Index(BMI): 21 Temperature(F): 98 Respiratory Rate(breaths/min): 18 [17:Photos:] Right, Lateral Lower Leg Right, Medial Ankle Left, Lateral Lower Leg Wound Location: Gradually  Appeared Gradually Appeared Gradually Appeared Wounding Event: Sickle Cell Lesion Sickle Cell Lesion Sickle Cell Lesion Primary Etiology: N/A Venous Leg Ulcer N/A Secondary Etiology: Anemia, Sickle Cell Disease, Anemia, Sickle Cell Disease, Anemia, Sickle Cell Disease, Comorbid History: Neuropathy Neuropathy Neuropathy 10/05/2012 06/26/2021 03/18/2023 Date Acquired: 111 101 9 Weeks of Treatment: Open Open Open Wound Status: No No No Wound Recurrence: Yes No No Clustered Wound: 10.3x6.5x0.2 13.2x7x0.2 8.4x5.7x0.1 Measurements L x W x D (cm) 52.582 72.571 37.605 A (cm) : rea 10.516 14.514 3.76 Volume (cm) : 72.00% -150.40% -964.10% % Reduction in Area: 44.00% -400.80% -965.20% % Reduction in Volume: Full Thickness Without Exposed Full Thickness Without Exposed Full Thickness Without Exposed Classification: Support Structures Support Structures Support Structures Large Large Medium Exudate A mount: Serosanguineous Serosanguineous Serous Exudate Type: red, brown red, brown amber Exudate Color: Distinct, outline attached Distinct, outline attached Distinct, outline attached Wound Margin: Medium (34-66%) Small (1-33%) Small  (1-33%) Granulation Amount: Pink Pink Pink Granulation Quality: Medium (34-66%) Large (67-100%) Large (67-100%) Necrotic Amount: Adherent Slough Adherent [facility] Necrotic Tissue: Fat Layer (Subcutaneous Tissue): Yes Fat Layer (Subcutaneous Tissue): Yes Fat Layer (Subcutaneous Tissue): Yes Exposed Structures: Fascia: No Fascia: No Fascia: No KEARI, BURAN ([id number]) [id number] Page 5 of 16 Tendon: No Tendon: No Tendon: No Muscle: No Muscle: No Muscle: No Joint: No Joint: No Joint: No Bone: No Bone: No Bone: No Small (1-33%) Small (1-33%) Small (1-33%) Epithelialization: Scarring: Yes Scarring: Yes Scarring: Yes Periwound Skin Texture: No Abnormalities Noted No Abnormalities Noted No Abnormalities Noted Periwound Skin Moisture: Hemosiderin Staining: Yes No Abnormalities Noted No Abnormalities Noted Periwound Skin Color: Ecchymosis: No No Abnormality No Abnormality No Abnormality Temperature: Yes Yes Yes Tenderness on Palpation: Wound Number: [id number] N/A Photos: N/A Left, Medial Lower Leg Left, Medial Ankle N/A Wound Location: Gradually Appeared Gradually Appeared N/A Wounding Event: Sickle Cell Lesion Sickle Cell Lesion N/A Primary Etiology: N/A N/A N/A Secondary Etiology: Anemia, Sickle Cell Disease, Anemia, Sickle Cell Disease, N/A Comorbid History: Neuropathy Neuropathy 03/18/2023 05/05/2023 N/A Date Acquired: 9 5 N/A Weeks of Treatment: Open Open N/A Wound Status: No No N/A Wound Recurrence: No No N/A Clustered Wound: 3.5x2.5x0.1 5.4x6x0.1 N/A Measurements L x W x D (cm) 6.872 25.447 N/A A (cm) : rea 0.687 2.545 N/A Volume (cm) : -562.70% -1320.80% N/A % Reduction in Area: -560.60% -1321.80% N/A % Reduction in Volume: Full Thickness Without Exposed Full Thickness With Exposed Support N/A Classification: Support Structures Structures Medium Medium N/A Exudate A mount: Serous Serosanguineous  N/A Exudate Type: amber red, brown N/A Exudate Color: Distinct, outline attached Distinct, outline attached N/A Wound Margin: Small (1-33%) Small (1-33%) N/A Granulation Amount: Pink Pink N/A Granulation Quality: Large (67-100%) Large (67-100%) N/A Necrotic Amount: Adherent Slough Eschar, Adherent Slough N/A Necrotic Tissue: Fat Layer (Subcutaneous Tissue): Yes Fat Layer (Subcutaneous Tissue): Yes N/A Exposed Structures: Fascia: No Fascia: No Tendon: No Tendon: No Muscle: No Muscle: No Joint: No Joint: No Bone: No Bone: No Small (1-33%) Small (1-33%) N/A Epithelialization: Scarring: Yes Excoriation: No N/A Periwound Skin Texture: Induration: No Callus: No Crepitus: No Rash: No Scarring: No No Abnormalities Noted Maceration: No N/A Periwound Skin Moisture: Dry/Scaly: No No Abnormalities Noted Atrophie Blanche: No N/A Periwound Skin Color: Cyanosis: No Ecchymosis: No Erythema: No Hemosiderin Staining: No Mottled: No Pallor: No Rubor: No No Abnormality No Abnormality N/A Temperature: Yes Yes N/A Tenderness on Palpation: Treatment Notes Electronic Signature(s) Signed: 06/09/2023 10:02:30 AM By: Duanne Guess MD FACS Entered By: Duanne Guess on 06/09/2023 10:02:30  LAJUANA, DERR ([id number]) [id number] Page 6 of 16 -------------------------------------------------------------------------------- Multi-Disciplinary Care Plan Details Patient Name: Date of Service: [name] [name] 06/09/2023 9:15 A M Medical Record Number: [id number] Patient Account Number: [id number] Date of Birth/Sex: Treating RN: Jun 27, 1970 (53 y.o. Katrinka Blazing Primary Care Juron Vorhees: Julianne Handler Other Clinician: Referring Jandy Brackens: Treating Kalana Yust/Extender: Koleen Nimrod Weeks in Treatment: 111 Multidisciplinary Care Plan reviewed with physician Active Inactive Necrotic Tissue Nursing Diagnoses: Impaired tissue  integrity related to necrotic/devitalized tissue Knowledge deficit related to management of necrotic/devitalized tissue Goals: Necrotic/devitalized tissue will be minimized in the wound bed Date Initiated: 12/04/2022 Target Resolution Date: 12/18/2023 Goal Status: Active Patient/caregiver will verbalize understanding of reason and process for debridement of necrotic tissue Date Initiated: 12/04/2022 Target Resolution Date: 12/18/2023 Goal Status: Active Interventions: Assess patient pain level pre-, during and post procedure and prior to discharge Provide education on necrotic tissue and debridement process Treatment Activities: Apply topical anesthetic as ordered : 12/04/2022 Enzymatic debridement : 12/04/2022 Notes: Wound/Skin Impairment Nursing Diagnoses: Impaired tissue integrity Goals: Patient/caregiver will verbalize understanding of skin care regimen Date Initiated: 04/17/2021 Target Resolution Date: 12/18/2023 Goal Status: Active Ulcer/skin breakdown will have a volume reduction of 30% by week 4 Date Initiated: 04/17/2021 Date Inactivated: 05/29/2021 Target Resolution Date: 05/15/2021 Goal Status: Met Ulcer/skin breakdown will have a volume reduction of 50% by week 8 Date Initiated: 05/29/2021 Date Inactivated: 06/26/2021 Target Resolution Date: 06/26/2021 Goal Status: Unmet Unmet Reason: venous reflux Interventions: Assess patient/caregiver ability to obtain necessary supplies Assess patient/caregiver ability to perform ulcer/skin care regimen upon admission and as needed Assess ulceration(s) every visit Provide education on ulcer and skin care Treatment Activities: Topical wound management initiated : 04/17/2021 Notes: 06/08/21: Left leg wounds greater than 30% volume reduction, right leg acute infection. Electronic Signature(s) Signed: 06/09/2023 5:36:52 PM By: Karie Schwalbe RN Entered By: Karie Schwalbe on 06/09/2023 17:32:41 [name] ([id number])  [id number] Page 7 of 16 -------------------------------------------------------------------------------- Pain Assessment Details Patient Name: Date of Service: [name] [name] 06/09/2023 9:15 A M Medical Record Number: [id number] Patient Account Number: [id number] Date of Birth/Sex: Treating RN: 14-May-1971 (53 y.o. F) Primary Care Vani Gunner: Julianne Handler Other Clinician: Referring Noelia Lenart: Treating Jobeth Pangilinan/Extender: Koleen Nimrod Weeks in Treatment: 111 Active Problems Location of Pain Severity and Description of Pain Patient Has Paino Yes Site Locations Rate the pain. Current Pain Level: 10 Worst Pain Level: 10 Least Pain Level: 0 Tolerable Pain Level: 3 Character of Pain Describe the Pain: Burning Pain Management and Medication Current Pain Management: Electronic Signature(s) Signed: 06/09/2023 10:11:50 AM By: Dayton Scrape Entered By: Dayton Scrape on 06/09/2023 09:17:34 -------------------------------------------------------------------------------- Patient/Caregiver Education Details Patient Name: Date of Service: [name], [name] 1/20/2025andnbsp9:15 A M Medical Record Number: [id number] Patient Account Number: [id number] Date of Birth/Gender: Treating RN: [birth year] (53 y.o. Katrinka Blazing Primary Care Physician: Julianne Handler Other Clinician: Referring Physician: Treating Physician/Extender: Vivianne Master in Treatment: [phone] Education Assessment Education Provided To: Patient SEPIDEH, ESCOTT ([id number]) [id number] Page 8 of 16 Education Topics Provided Wound/Skin Impairment: Methods: Explain/Verbal Responses: State content correctly Electronic Signature(s) Signed: 06/09/2023 5:36:52 PM By: Karie Schwalbe RN Entered By: Karie Schwalbe on 06/09/2023 17:33:00 -------------------------------------------------------------------------------- Wound Assessment  Details Patient Name: Date of Service: [name], [place] [name] 06/09/2023 9:15 A M Medical Record Number: [id number] Patient Account Number: [id number] Date of Birth/Sex: Treating RN: July 14, 1970 (53 y.o. F) Primary Care Lorieann Argueta: Julianne Handler Other Clinician: Referring Karlene Southard: Treating Thurma Priego/Extender: Koleen Nimrod Tania Ade  in Treatment: 111 Wound Status Wound Number: 17 Primary Etiology: Sickle Cell Lesion Wound Location: Right, Lateral Lower Leg Wound Status: Open Wounding Event: Gradually Appeared Comorbid History: Anemia, Sickle Cell Disease, Neuropathy Date Acquired: 10/05/2012 Weeks Of Treatment: 111 Clustered Wound: Yes Photos Wound Measurements Length: (cm) 10.3 Width: (cm) 6.5 Depth: (cm) 0.2 Area: (cm) 52.582 Volume: (cm) 10.516 % Reduction in Area: 72% % Reduction in Volume: 44% Epithelialization: Small (1-33%) Tunneling: No Undermining: No Wound Description Classification: Full Thickness Without Exposed Support Structures Wound Margin: Distinct, outline attached Exudate Amount: Large Exudate Type: Serosanguineous Exudate Color: red, brown Foul Odor After Cleansing: No Slough/Fibrino Yes Wound Bed Granulation Amount: Medium (34-66%) Exposed Structure Granulation Quality: Pink Fascia Exposed: No Necrotic Amount: Medium (34-66%) Fat Layer (Subcutaneous Tissue) Exposed: Yes Necrotic Quality: Adherent Slough Tendon Exposed: No Muscle Exposed: No Joint Exposed: No Bone Exposed: No [address] [place], Felecia Shelling ([id number]) [id number].pdf Page 9 of 16 Texture Color No Abnormalities Noted: No No Abnormalities Noted: No Scarring: Yes Ecchymosis: No Hemosiderin Staining: Yes Moisture No Abnormalities Noted: Yes Temperature / Pain Temperature: No Abnormality Tenderness on Palpation: Yes Treatment Notes Wound #17 (Lower Leg) Wound Laterality: Right, Lateral Cleanser Soap and Water Discharge Instruction:  May shower and wash wound with dial antibacterial soap and water prior to dressing change. Wound Cleanser Discharge Instruction: Cleanse the wound with wound cleanser prior to applying a clean dressing using gauze sponges, not tissue or cotton balls. Peri-Wound Care Triamcinolone 15 (g) Discharge Instruction: Use triamcinolone 15 (g) as directed Sween Lotion (Moisturizing lotion) Discharge Instruction: Apply moisturizing lotion as directed Topical Gentamicin Discharge Instruction: As directed by physician Mupirocin Ointment Discharge Instruction: Apply Mupirocin (Bactroban) as instructed Primary Dressing Santyl Ointment Discharge Instruction: 06/09/23 (on hold )Apply nickel thick amount to wound bed as instructed Secondary Dressing ABD Pad, 5x9 Discharge Instruction: Apply over primary dressing as directed. Woven Gauze Sponge, Non-Sterile 4x4 in Discharge Instruction: Moisten with Vashe and Apply to wound Secured With Elastic Bandage 4 inch (ACE bandage) Discharge Instruction: Secure with ACE bandage as directed. Kerlix Roll Sterile, 4.5x3.1 (in/yd) Discharge Instruction: Secure with Kerlix as directed. Compression Wrap Compression Stockings Add-Ons Electronic Signature(s) Signed: 06/09/2023 5:36:52 PM By: Karie Schwalbe RN Entered By: Karie Schwalbe on 06/09/2023 09:48:15 -------------------------------------------------------------------------------- Wound Assessment Details Patient Name: Date of Service: [name], [name] 06/09/2023 9:15 A M Medical Record Number: [id number] Patient Account Number: [id number] Date of Birth/Sex: Treating RN: October 23, 1970 (53 y.o. F) Primary Care Daymon Hora: Julianne Handler Other Clinician: Referring Keyunna Coco: Treating Apostolos Blagg/Extender: Koleen Nimrod Weeks in Treatment: [address], [place] ([id number]) [id number] Page 10 of 16 Wound Status Wound Number: 21 Primary Etiology: Sickle Cell  Lesion Wound Location: Right, Medial Ankle Secondary Etiology: Venous Leg Ulcer Wounding Event: Gradually Appeared Wound Status: Open Date Acquired: 06/26/2021 Comorbid History: Anemia, Sickle Cell Disease, Neuropathy Weeks Of Treatment: 101 Clustered Wound: No Photos Wound Measurements Length: (cm) 13.2 Width: (cm) 7 Depth: (cm) 0.2 Area: (cm) 72.571 Volume: (cm) 14.514 % Reduction in Area: -150.4% % Reduction in Volume: -400.8% Epithelialization: Small (1-33%) Tunneling: No Undermining: No Wound Description Classification: Full Thickness Without Exposed Support Structures Wound Margin: Distinct, outline attached Exudate Amount: Large Exudate Type: Serosanguineous Exudate Color: red, brown Foul Odor After Cleansing: No Slough/Fibrino Yes Wound Bed Granulation Amount: Small (1-33%) Exposed Structure Granulation Quality: Pink Fascia Exposed: No Necrotic Amount: Large (67-100%) Fat Layer (Subcutaneous Tissue) Exposed: Yes Necrotic Quality: Adherent Slough Tendon Exposed: No Muscle Exposed: No Joint Exposed: No Bone Exposed: No Periwound Skin  Texture Texture Color No Abnormalities Noted: No No Abnormalities Noted: Yes Scarring: Yes Temperature / Pain Temperature: No Abnormality Moisture No Abnormalities Noted: Yes Tenderness on Palpation: Yes Treatment Notes Wound #21 (Ankle) Wound Laterality: Right, Medial Cleanser Soap and Water Discharge Instruction: May shower and wash wound with dial antibacterial soap and water prior to dressing change. Wound Cleanser Discharge Instruction: Cleanse the wound with wound cleanser prior to applying a clean dressing using gauze sponges, not tissue or cotton balls. Peri-Wound Care Triamcinolone 15 (g) Discharge Instruction: Use triamcinolone 15 (g) as directed Sween Lotion (Moisturizing lotion) Discharge Instruction: Apply moisturizing lotion as directed Topical Gentamicin Discharge Instruction: As directed by  physician [name] ([id number]) [id number] Page 11 of 16 Mupirocin Ointment Discharge Instruction: Apply Mupirocin (Bactroban) as instructed Primary Dressing Santyl Ointment Discharge Instruction: 06/09/23 (on hold )Apply nickel thick amount to wound bed as instructed Secondary Dressing ABD Pad, 5x9 Discharge Instruction: Apply over primary dressing as directed. Woven Gauze Sponge, Non-Sterile 4x4 in Discharge Instruction: Moisten with Vashe and Apply to wound Secured With Elastic Bandage 4 inch (ACE bandage) Discharge Instruction: Secure with ACE bandage as directed. Kerlix Roll Sterile, 4.5x3.1 (in/yd) Discharge Instruction: Secure with Kerlix as directed. Compression Wrap Compression Stockings Add-Ons Electronic Signature(s) Signed: 06/09/2023 5:36:52 PM By: Karie Schwalbe RN Entered By: Karie Schwalbe on 06/09/2023 09:48:37 -------------------------------------------------------------------------------- Wound Assessment Details Patient Name: Date of Service: [name], [name] 06/09/2023 9:15 A M Medical Record Number: [id number] Patient Account Number: [id number] Date of Birth/Sex: Treating RN: [birth year] (53 y.o. F) Primary Care Sallyanne Birkhead: Julianne Handler Other Clinician: Referring Durwood Dittus: Treating Markeese Boyajian/Extender: Koleen Nimrod Weeks in Treatment: 111 Wound Status Wound Number: 26 Primary Etiology: Sickle Cell Lesion Wound Location: Left, Lateral Lower Leg Wound Status: Open Wounding Event: Gradually Appeared Comorbid History: Anemia, Sickle Cell Disease, Neuropathy Date Acquired: 03/18/2023 Weeks Of Treatment: 9 Clustered Wound: No Photos Wound Measurements Length: (cm) 8.4 Width: (cm) 5.7 Depth: (cm) 0.1 Area: (cm) 37.605 Volume: (cm) 3.76 Boettner, Angeline ([id number]) % Reduction in Area: -964.1% % Reduction in Volume: -965.2% Epithelialization: Small (1-33%) Tunneling: No Undermining:  No [phone].pdf Page 12 of 16 Wound Description Classification: Full Thickness Without Exposed Support Structures Wound Margin: Distinct, outline attached Exudate Amount: Medium Exudate Type: Serous Exudate Color: amber Foul Odor After Cleansing: No Slough/Fibrino Yes Wound Bed Granulation Amount: Small (1-33%) Exposed Structure Granulation Quality: Pink Fascia Exposed: No Necrotic Amount: Large (67-100%) Fat Layer (Subcutaneous Tissue) Exposed: Yes Necrotic Quality: Adherent Slough Tendon Exposed: No Muscle Exposed: No Joint Exposed: No Bone Exposed: No Periwound Skin Texture Texture Color No Abnormalities Noted: No No Abnormalities Noted: Yes Scarring: Yes Temperature / Pain Temperature: No Abnormality Moisture No Abnormalities Noted: Yes Tenderness on Palpation: Yes Treatment Notes Wound #26 (Lower Leg) Wound Laterality: Left, Lateral Cleanser Soap and Water Discharge Instruction: May shower and wash wound with dial antibacterial soap and water prior to dressing change. Wound Cleanser Discharge Instruction: Cleanse the wound with wound cleanser prior to applying a clean dressing using gauze sponges, not tissue or cotton balls. Peri-Wound Care Triamcinolone 15 (g) Discharge Instruction: Use triamcinolone 15 (g) as directed Sween Lotion (Moisturizing lotion) Discharge Instruction: Apply moisturizing lotion as directed Topical Gentamicin Discharge Instruction: As directed by physician Mupirocin Ointment Discharge Instruction: Apply Mupirocin (Bactroban) as instructed Primary Dressing Santyl Ointment Discharge Instruction: 06/09/23 (on hold )Apply nickel thick amount to wound bed as instructed Secondary Dressing ABD Pad, 5x9 Discharge Instruction: Apply over primary dressing as directed. Woven Gauze Sponge, Non-Sterile 4x4 in Discharge  Instruction: Moisten with Vashe and Apply to wound Secured With Elastic Bandage 4 inch (ACE  bandage) Discharge Instruction: Secure with ACE bandage as directed. Kerlix Roll Sterile, 4.5x3.1 (in/yd) Discharge Instruction: Secure with Kerlix as directed. Compression Wrap Compression Stockings Add-Ons Electronic Signature(s) Signed: 06/09/2023 5:36:52 PM By: Karie Schwalbe RN Entered By: Karie Schwalbe on 06/09/2023 09:49:02 [name] ([id number]) [id number] Page 13 of 16 -------------------------------------------------------------------------------- Wound Assessment Details Patient Name: Date of Service: [name] [name] 06/09/2023 9:15 A M Medical Record Number: [id number] Patient Account Number: [id number] Date of Birth/Sex: Treating RN: [birth year] (53 y.o. F) Primary Care Wilmar Prabhakar: Julianne Handler Other Clinician: Referring Laiyah Exline: Treating Zaniel Marineau/Extender: Koleen Nimrod Weeks in Treatment: 111 Wound Status Wound Number: 27 Primary Etiology: Sickle Cell Lesion Wound Location: Left, Medial Lower Leg Wound Status: Open Wounding Event: Gradually Appeared Comorbid History: Anemia, Sickle Cell Disease, Neuropathy Date Acquired: 03/18/2023 Weeks Of Treatment: 9 Clustered Wound: No Photos Wound Measurements Length: (cm) 3.5 Width: (cm) 2.5 Depth: (cm) 0.1 Area: (cm) 6.872 Volume: (cm) 0.687 % Reduction in Area: -562.7% % Reduction in Volume: -560.6% Epithelialization: Small (1-33%) Tunneling: No Undermining: No Wound Description Classification: Full Thickness Without Exposed Support Wound Margin: Distinct, outline attached Exudate Amount: Medium Exudate Type: Serous Exudate Color: amber Structures Foul Odor After Cleansing: No Slough/Fibrino Yes Wound Bed Granulation Amount: Small (1-33%) Exposed Structure Granulation Quality: Pink Fascia Exposed: No Necrotic Amount: Large (67-100%) Fat Layer (Subcutaneous Tissue) Exposed: Yes Necrotic Quality: Adherent Slough Tendon Exposed: No Muscle Exposed:  No Joint Exposed: No Bone Exposed: No Periwound Skin Texture Texture Color No Abnormalities Noted: No No Abnormalities Noted: Yes Scarring: Yes Temperature / Pain Temperature: No Abnormality Moisture No Abnormalities Noted: Yes Tenderness on Palpation: Yes Treatment Notes Wound #27 (Lower Leg) Wound Laterality: Left, Medial Faries, Felecia Shelling ([id number]) [id number] Page 14 of 16 Cleanser Soap and Water Discharge Instruction: May shower and wash wound with dial antibacterial soap and water prior to dressing change. Wound Cleanser Discharge Instruction: Cleanse the wound with wound cleanser prior to applying a clean dressing using gauze sponges, not tissue or cotton balls. Peri-Wound Care Triamcinolone 15 (g) Discharge Instruction: Use triamcinolone 15 (g) as directed Sween Lotion (Moisturizing lotion) Discharge Instruction: Apply moisturizing lotion as directed Topical Gentamicin Discharge Instruction: As directed by physician Mupirocin Ointment Discharge Instruction: Apply Mupirocin (Bactroban) as instructed Primary Dressing Santyl Ointment Discharge Instruction: 06/09/23 (on hold )Apply nickel thick amount to wound bed as instructed Secondary Dressing ABD Pad, 5x9 Discharge Instruction: Apply over primary dressing as directed. Woven Gauze Sponge, Non-Sterile 4x4 in Discharge Instruction: Moisten with Vashe and Apply to wound Secured With Elastic Bandage 4 inch (ACE bandage) Discharge Instruction: Secure with ACE bandage as directed. Kerlix Roll Sterile, 4.5x3.1 (in/yd) Discharge Instruction: Secure with Kerlix as directed. Compression Wrap Compression Stockings Add-Ons Electronic Signature(s) Signed: 06/09/2023 5:36:52 PM By: Karie Schwalbe RN Entered By: Karie Schwalbe on 06/09/2023 09:49:24 -------------------------------------------------------------------------------- Wound Assessment Details Patient Name: Date of Service: [name],  [name] 06/09/2023 9:15 A M Medical Record Number: [id number] Patient Account Number: [id number] Date of Birth/Sex: Treating RN: [birth year] (53 y.o. F) Primary Care Darline Faith: Julianne Handler Other Clinician: Referring Jacalyn Biggs: Treating Briyana Badman/Extender: Koleen Nimrod Weeks in Treatment: 111 Wound Status Wound Number: 29 Primary Etiology: Sickle Cell Lesion Wound Location: Left, Medial Ankle Wound Status: Open Wounding Event: Gradually Appeared Comorbid History: Anemia, Sickle Cell Disease, Neuropathy Date Acquired: 05/05/2023 Weeks Of Treatment: 5 Clustered Wound: No Photos KANDIS, MCCRANEY ([id number]) [id number] Page 15 of 16  Wound Measurements Length: (cm) 5.4 Width: (cm) 6 Depth: (cm) 0.1 Area: (cm) 25.447 Volume: (cm) 2.545 % Reduction in Area: -1320.8% % Reduction in Volume: -1321.8% Epithelialization: Small (1-33%) Tunneling: No Undermining: No Wound Description Classification: Full Thickness With Exposed Support Structures Wound Margin: Distinct, outline attached Exudate Amount: Medium Exudate Type: Serosanguineous Exudate Color: red, brown Foul Odor After Cleansing: No Slough/Fibrino Yes Wound Bed Granulation Amount: Small (1-33%) Exposed Structure Granulation Quality: Pink Fascia Exposed: No Necrotic Amount: Large (67-100%) Fat Layer (Subcutaneous Tissue) Exposed: Yes Necrotic Quality: Eschar, Adherent Slough Tendon Exposed: No Muscle Exposed: No Joint Exposed: No Bone Exposed: No Periwound Skin Texture Texture Color No Abnormalities Noted: No No Abnormalities Noted: No Callus: No Atrophie Blanche: No Crepitus: No Cyanosis: No Excoriation: No Ecchymosis: No Induration: No Erythema: No Rash: No Hemosiderin Staining: No Scarring: No Mottled: No Pallor: No Moisture Rubor: No No Abnormalities Noted: No Dry / Scaly: No Temperature / Pain Maceration: No Temperature: No Abnormality Tenderness on  Palpation: Yes Treatment Notes Wound #29 (Ankle) Wound Laterality: Left, Medial Cleanser Soap and Water Discharge Instruction: May shower and wash wound with dial antibacterial soap and water prior to dressing change. Wound Cleanser Discharge Instruction: Cleanse the wound with wound cleanser prior to applying a clean dressing using gauze sponges, not tissue or cotton balls. Peri-Wound Care Triamcinolone 15 (g) Discharge Instruction: Use triamcinolone 15 (g) as directed Sween Lotion (Moisturizing lotion) Discharge Instruction: Apply moisturizing lotion as directed Topical Gentamicin Discharge Instruction: As directed by physician Mupirocin Ointment Discharge Instruction: Apply Mupirocin (Bactroban) as instructed [name] ([id number]) [id number] Page 16 of 16 Primary Dressing Santyl Ointment Discharge Instruction: 06/09/23 (on hold )Apply nickel thick amount to wound bed as instructed Secondary Dressing ABD Pad, 5x9 Discharge Instruction: Apply over primary dressing as directed. Woven Gauze Sponge, Non-Sterile 4x4 in Discharge Instruction: Moisten with Vashe and Apply to wound Secured With Elastic Bandage 4 inch (ACE bandage) Discharge Instruction: Secure with ACE bandage as directed. Kerlix Roll Sterile, 4.5x3.1 (in/yd) Discharge Instruction: Secure with Kerlix as directed. Compression Wrap Compression Stockings Add-Ons Electronic Signature(s) Signed: 06/09/2023 5:36:52 PM By: Karie Schwalbe RN Entered By: Karie Schwalbe on 06/09/2023 09:49:50 -------------------------------------------------------------------------------- Vitals Details Patient Name: Date of Service: [name], [name] 06/09/2023 9:15 A M Medical Record Number: [id number] Patient Account Number: [id number] Date of Birth/Sex: Treating RN: [birth year] (53 y.o. F) Primary Care Elaiza Shoberg: Julianne Handler Other Clinician: Referring Vernon Maish: Treating Shanel Prazak/Extender: Koleen Nimrod Weeks in Treatment: 111 Vital Signs Time Taken: 09:16 Temperature (F): 98 Height (in): 67 Pulse (bpm): 102 Weight (lbs): 134 Respiratory Rate (breaths/min): 18 Body Mass Index (BMI): 21 Blood Pressure (mmHg): 142/65 Reference Range: 80 - 120 mg / dl Electronic Signature(s) Signed: 06/09/2023 10:11:50 AM By: Dayton Scrape Entered By: Dayton Scrape on 06/09/2023 09:17:06

## 2023-06-09 NOTE — Progress Notes (Addendum)
ARRI, KOCHAN ([id number]) [id number] Page 1 of 16 Visit Report for 06/09/2023 Chief Complaint Document Details Patient Name: Date of Service: [name] [name] 06/09/2023 9:15 A M Medical Record Number: [id number] Patient Account Number: [id number] Date of Birth/Sex: Treating RN: [birth year] (53 y.o. F) Primary Care Provider: Julianne Handler Other Clinician: Referring Provider: Treating Provider/Extender: Koleen Nimrod Weeks in Treatment: 111 Information Obtained from: Patient Chief Complaint the patient is here for evaluation of her bilateral lower extremity sickle cell ulcers 04/17/2021; patient comes in for substantial wounds on the right and left lower leg Electronic Signature(s) Signed: 06/09/2023 10:02:38 AM By: Duanne Guess MD FACS Entered By: Duanne Guess on 06/09/2023 10:02:38 -------------------------------------------------------------------------------- HPI Details Patient Name: Date of Service: [name] [name], [name] 06/09/2023 9:15 A M Medical Record Number: [id number] Patient Account Number: [id number] Date of Birth/Sex: Treating RN: Jan 17, 1971 (53 y.o. F) Primary Care Provider: Julianne Handler Other Clinician: Referring Provider: Treating Provider/Extender: Koleen Nimrod Weeks in Treatment: 111 History of Present Illness Location: medial and lateral ankle region on the right and left medial malleolus Quality: Patient reports experiencing a shooting pain to affected area(s). Severity: Patient states wound(s) are getting worse. Duration: right lower extremity bimalleolar ulcers have been present for approximately 2 years; the right medial malleolus ulcer has been there proximally 6 months Timing: Pain in wound is constant (hurts all the time) Context: The wound would happen gradually ssociated Signs and Symptoms: Patient reports having increase discharge. A HPI Description: 53 year old patient  with a history of sickle cell anemia who was last seen by me with ulceration of the right lower extremity above the ankle and was referred to Dr. Marzetta Board for a surgical debridement as I was unable to do anything in the office due to excruciating pain. At that stage she was referred from the plastic surgery service to dermatology who treated her for a skin infection with doxycycline and then Levaquin and a local antibiotic ointment. I understand the patient has since developed ulceration on the left ankle both medial and lateral and was now referred back to the wound center as dermatology has finished the management. I do not have any notes from the dermatology department Old notes: 53 year old patient with a history of sickle cell anemia, pain bilateral lower extremities, right lower extremity ulcer and has a history of receiving a skin graft( Theraskin) several months ago. She has been visiting the wound center [facility] and was seen by Dr. Leanord Hawking and Dr. Marzetta Board. after prolonged conservator therapy between July 2016 and January 2017. She had been seen by the plastic surgeon and taken to the OR for debridement and application of Theraskin. She had 3 applications of Theraskin and was then treated with collagen. Prior to that she had a history of similar problems in 2014 and was treated conservatively. Had a reflux study done for the right lower extremity in August 2016 without reflux or DVT . Past medical history significant for sickle cell disease, anemia, leg ulcers, cholelithiasis,and has never been a smoker. Once the patient was discharged on the wound center she says within 2 or 3 weeks the problems recurred and she has been treating it conservatively. since I saw her 3 weeks ago at [facility] she has been unable to get her dressing material but has completed a course of doxycycline. 6/7/ 2017 -- lower extremity venous duplex reflux evaluation was done No evidence of SVT or DVT in the RLL. No  venous incompetence in the RLL. No further  vascular workup is indicated at this time. She was seen by Dr. Mina Marble, on 10/04/2015. She agreed with the plan of taking her to the OR for debridement and application of theraskin and would also take biopsies to rule out pyoderma gangrenosum. [name] ([id number]) [id number] Page 2 of 16 Follow-up note dated May 31 received and she was status post application of Theraskin to multiple ulcers around the right ankle. Pathology did not show evidence of malignancy or pyoderma gangrenosum. She would continue to see as in the wound clinic for further care and see Dr. Marzetta Board as needed. The patient brought the biopsy report and it was consistent with stasis ulcer no evidence of malignancy and the comment was that there was some adjacent neovascularization, fibrosis and patchy perivascular chronic inflammation. 11/15/2015 -- today we applied her first application of Theraskin 11/30/15; TheraSkin #2 12/13/2015 -- she is having a lot of pain locally and is here for possible application of a theraskin today. 01/16/2016 -- the patient has significant pain and has noticed despite in spite of all local care and oral pain medication. It is impossible to debride her in the office. 02/06/2016 -- I do not see any notes from Dr. Leta Baptist( the patient has not made a call to the office know as she heard from them) and the only visit to recently was with her PCP Dr. Gypsy Decant -- I saw her on 01/16/2016 and prescribed 90 tablets of oxycodone 10 mg and did lab work and screening for HIV. the HIV was negative and hemoglobin was 6.3 with a WBC count of 14.9 and hematocrit of 17.8 with platelets of 561. 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 The bimalleolar ulcers to the right lower extremity have been present for  approximately 2 years, the left medial malleolus ulcer has been present approximately 6 months. She has followed with Dr.Thimmappa in the past and has had a total of 3 applications of Theraskin (01/2015, 09/2015, 06/17/16). She has also followed with Dr. Meyer Russel here in the clinic and has received 2 applications of TheraSkin (11/10/15, 11/30/15). The patient does experience chronic, and is not amenable to debridement. 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 She is not currently on any antibiotic therapy and has not been treated with any recently. 07/17/2016 -- was seen by Dr. Leta Baptist of plastic surgery who saw her 2 weeks postop application of Theraskin #3. She had removed her dressing and asked her to apply silver alginate on alternate days and follow-up back with the wound center. Future debridements and application of skin substitute would have to be done in the hospital due to her high risk for anesthesia. READMISSION 04/17/2021 Patient is now a 53 year old woman that we have had in this clinic for a prolonged period of time and 2016-2017 and then again for 2 visits in February 2018. At that point she had wounds on the right lower leg predominantly medial. She had also been seen by plastic surgery Dr. Marzetta Board who I believe took her to the OR for operative debridement and application of TheraSkin in 2017. After she left our clinic she was followed for a very prolonged period of time in the wound care center in [facility] who then referred her ultimately to [facility] where she was seen by Dr. Mardene Speak. Again taken her to the OR for skin grafting which apparently did  not take. She had multiple other attempts at dressings although I have not really looked over all of these notes in great detail. She has not been seen in a wound care center in about a year. She states over the last year in addition to her right lower leg she has developed wounds on the left lower leg  quite extensive. She is using Xeroform to all of these wounds without really any improvement. She also has Medicaid which does not cover wound products. The patient has had vascular work-ups in the past including most recently on 03/28/2021 showing biphasic waveforms on the right triphasic at the PTA and biphasic at the dorsalis pedis on the left. She was unable to tolerate any degree of compression to do ABIs. Unfortunately TBI's were also not done. She had venous reflux studies done in 2017. This did not show any evidence of a DVT or SVT and no venous incompetence was noted in the right leg at the time this was the only side with the wound As noted I did not look all over her old records. She apparently had a course of HBO and Baptist although I am not sure what the indication would have been. In any case she developed seizures and terminated treatment earlier. She is generally much more disabled than when we last saw her in clinic. She can no longer walk pretty much wheelchair-bound because predominantly of pain in the left hip. 04/24/2021; the patient tolerated the wraps we put on. We used Santyl and Hydrofera Blue under compression. I brought her back for a nurse visit for a change in dressing. With Medicaid we will have a hard time getting anything paid for and hence the need for compression. She arrives in clinic with all the wounds looking somewhat better in terms of surface 12/20; circumferential wound on the right from the lateral to the medial. She has open areas on the left medial and left lateral x2 on all of this with the same surface. This does not look completely healthy although she does have some epithelialization. She is not complaining of a lot of pain which is unusual for her sickle ulcers. I have not looked over her extensive records from [facility]. She had recent arterial studies and has a history of venous reflux studies I will need to look these over although I do not believe she  has significant arterial disease 2023 05/22/2021; patient's wound areas measure slightly smaller. Still a lot of drainage coming from the right we have been using Hydrofera Blue and Santyl with some improvement in the wound surfaces. She tells me she will be getting transfused later in the week for her underlying sickle cell anemia I have looked over her recent arterial studies which were done in the fall. This was in November and showed biphasic and triphasic waveforms but she could not tolerate ABIs because of pressure and unfortunately TBI's were not done. She has not had recent venous reflux studies that I can see 1/10; not much change about the same surface area. This has a yellowish surface to it very gritty. We have been using Santyl and Hydrofera Blue for a prolonged period. Culture I did last week showed methicillin sensitive staph aureus "rare". Our intake nurse reports greenish drainage which may be the Hydrofera Blue itself 1/17; wounds are continue to measure smaller although I am not sure about the accuracy here. Especially the areas on the right are covered in what looks to be a nonviable surface although she  does have some epithelialization. Similarly she has areas on the left medial and left lateral ankle area which appear to have a better surface and perhaps are slightly smaller. We have been using Santyl and Hydrofera Blue. She cannot tolerate mechanical debridements She went for her reflux studies which showed significant reflux at the greater saphenous vein at the saphenofemoral junction as well as the greater saphenous vein in the proximal calf on the left she had reflux in the thigh and the common femoral vein and supra vein Fishel vein reflux in the greater saphenous vein. I will have vein and vascular look at this. My thoughts have been that these are likely sickle wounds. I looked through her old records from [facility] wound care center and then when she graduated to [facility] wound care center where she saw Dr. Ronda Fairly and Dr. Mardene Speak. Although I can see she had reflux studies done I do not see that she actually saw a vein and vascular. I went over the fact that she had operative debridements and actual skin grafting that did not take. I do not think these wounds have ever really progressed towards healing 1/31;Substantial wounds on the right ankle area. Hyper granulated very gritty adherent debris on the surface. 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 On the left she has no DVT or SVT deep vein reflux in the common femoral vein. Superficial vein reflux in the greater saphenous vein on although the vein diameter is not really all that large. I do not think there is anything that can be done with these although I am going to send her for consultation to vein and vascular. 2/7; Wound exam; substantial wound area on the right posterior ankle area and areas on the left medial ankle and left lateral ankle. I was able to debride the left medial ankle last week fairly aggressively and it is back this week to a completely nonviable surface She will see vascular surgery this Friday and I would like them to review the venous studies and also any comments on her arterial status. If they do not see an issue here I am going to refer her to plastic surgery for an operative debridement perhaps intraoperative ACell or Integra. Eventually she will require a deep KELLSEY, THEDE ([id number]) [id number] Page 3 of 16 tissue culture again 2/14; substantial wound area on the right posterior  ankle, medial ankle. We have been using silver alginate The patient was seen by vein and vascular she had both venous reflux studies and arterial studies. 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 She had no evidence of superficial venous thrombosis. She did have superficial vein reflux at the saphenofemoral junction and the greater saphenous vein. On the left no evidence of a DVT no evidence of superficial venous throat thrombosis she did have deep vein reflux in the common femoral vein and superficial vein reflux in the greater saphenous vein but these were not felt to be amenable to ablation. In terms of arterial studies she had triphasic and wife  biphasic wave waveforms bilaterally not felt to have a significant arterial issue. I do not get the feeling that they felt that any part of her nonhealing wounds were related to either arterial or venous issues. They did note that she had venous reflux at the right at the [facility] and [facility]. And also on the left there were reflux in the deep system at the common femoral vein and greater saphenous vein in the proximal thigh. Nothing amenable to ablation. 2/20; she is making some decent progress on the right where there is nice skin between the 2 open areas on the right ankle. The surfaces here do not look viable yet there is some surrounding epithelialization. She still has a small area on the left medial ankle area. Hyper-granulated Jody's away always 2/28 patient has an appointment with plastic surgery on 3/8. We will see her back on 3/9. She may have to call us to get the area redressed. We've been using Santyl under silver alginate. We made a nice improvement on the left medial ankle. The larger wounds on the right also looks somewhat better in terms of epithelialization although I think they could benefit from an aggressive debridement if plastic surgery would be willing to do that.  Perhaps placement of Integra or a cell 07/26/2021: She saw Dr. Arita Miss yesterday. He raised the question as to whether or not this might be pyoderma and wanted to wait until that question was answered by dermatology before proceeding with any sort of operative debridement. We have continue to use Santyl under silver alginate with Kerlix and Coban wraps. Overall, her wounds appear to be continuing to contract and epithelialize, with some granulation tissue present. There continues to be some slough on all wound surfaces. 08/09/2021: She has not been able to get an appointment with dermatology because apparently the offices in [facility] and [place] do not accept Medicaid. She is looking into whether or not she can be seen at the main [facility] dermatology clinic. This is necessary because plastic surgery is concerned that her wounds might represent pyoderma and they did not want to do any procedure until that was clarified. We have been using Santyl under silver alginate with Kerlix and Coban wraps. Today, there was a greater amount of drainage on her dressings with a slight green discoloration and significant odor. Despite this, her wounds continue to contract and epithelialize. There is pale granulation tissue present and actually, on the left medial ankle, the granulation tissue is a bit hypertrophic. 08/16/2021: Last week, I took a culture and this grew back rare methicillin-resistant Staph aureus and rare corynebacterium. The MRSA was sensitive to gentamicin which we began applying topically on an empiric basis. This week, her wounds are a bit smaller and the drainage and odor are less. Her primary care provider is working on assisting the patient with a dermatology evaluation. She has been in silver alginate over the gentamicin that was started last week along with Kerlix and Coban wraps. 08/23/2021: Because she has Medicaid, we have been unable to get her into see any dermatologist in the  [place] to rule out pyoderma gangrenosum, which was a requirement from plastic surgery prior to any sort of debridement and grafting. Despite this, however, all of her wounds continue to get smaller. The wound on her left medial ankle is nearly closed. There is no odor from the wounds, although she still accumulates a modest amount of drainage on her dressings. 08/30/2021: The lateral right ankle wound and the  medial left ankle wound are a bit smaller today. The medial right ankle wound is about the same size. They are less tender. We have still been unable to get her into dermatology. 09/06/2021: All of the wounds are about the same size today. She continues to endorse minimal pain. I communicated with Dr. Arita Miss in plastic surgery regarding our issues getting a dermatology appointment; he was out of town but indicated that he would look into perhaps performing the biopsy in his office and will have his office contact her. 09/14/2021: The patient has an appointment in dermatology, but it is not until October. Her wounds are roughly the same; she continues to have very thick purulent-looking drainage on her dressings. 09/20/2021: The left medial wound is nearly closed and just has a bit of accumulated eschar on the surface. The right medial and lateral ankle wounds are perhaps a little bit smaller. They continue to have a very pale surface with accumulation of thin slough. 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 I did not think Jodie Echevaria was indicated based on this. She is getting topical mupirocin with Prisma silver collagen. 10/04/2021: The patient was not seen in clinic last week due to childcare coverage issues. In the interim, the left medial leg wound has closed. The right sided leg wounds are smaller. There is more granulation tissue coming through, particularly on the lateral wound. The surface remains somewhat gritty. We have been applying topical mupirocin and Prisma silver  collagen. 10/11/2021: The left medial leg wound remains closed. She does complain of some anesthetic sensation to the area. Both of the right-sided leg wounds are smaller but still have accumulated slough. 10/18/2021: Both right-sided leg wounds are minimally smaller this week. She still continues to accumulate slough and has thick drainage on her dressings. 10/23/2021: Both wounds continue to contract. There is still slough buildup. She has been approved for a keratin-based skin substitute trial product but it will not be available until next week. 10/30/2021: The wounds are about the same to perhaps slightly smaller. There is still continued slough buildup. Unfortunately, the rep for the keratin based product did not show up today and did not answer his phone when called. 11/08/2021: The wounds are little bit smaller today. She continues to have thick drainage but the surfaces are relatively clean with just a little bit of slough accumulation. She reported to me today that she is unable to completely flex her left ankle and on examination it seems this is potentially related to scar tissue from her wounds. We do have the ProgenaMatrix trial product available for her today. 11/15/2021: Both wounds are smaller today. There is some slough accumulation on the surfaces, but the medial wound, in particular looks like it is filling in and is less deep. She did hear from physical therapy and she is going to start working with them on July 11. She is here for her second application of the trial skin substitute, ProgenaMatrix. 11/22/2021: Both wounds continue to contract, the medial more dramatically than the lateral. Both wounds have a layer of slough on the surface, but underneath this, the gritty fibrous tissue has a little bit more of a pink cast to it rather than being as pale as it has been. 11/29/2021: The wounds are roughly the same size this week, perhaps a millimeter or 2 smaller. The medial wound has filled  in and is nearly flush with the surrounding skin surface. She continues to have a lot of  slough accumulation on both surfaces. 12/06/2021: No significant change to her wounds, but she has a new opening on her dorsal foot, just distal to the right lateral ankle wound. The area on her left medial ankle that reopened looks a little bit larger today. She has quite a bit of pain associated with the new wound. 12/12/2021: Her wounds look about the same but the new opening on her right lateral dorsal foot is a little bit bigger. She continues to have a fair amount of pain with this wound. 12/26/2021: The left medial ankle wound is tiny and superficial. She has 2 areas of crusting on her left lateral ankle, however, that appear to be threatening to DENECIA, GOLEMAN ([id number]) [id number] Page 4 of 16 open again. Her right medial ankle wound is a little bit smaller today but still continues to accumulate thick rubbery slough. The new dorsal foot wound is exquisitely painful but there is no odor or purulent drainage. No erythema or induration. The right lateral ankle wound looks about the same today, again with thick rubbery slough. 01/03/2022: The left medial ankle wound has closed again. Both right ankle wounds appear to be about the same size with thick rubbery slough. The dorsal foot wound on the right continues to be quite painful and she stated that she did not want any debridement of that site today. 01/10/2022: No real change to any of her wounds. She continues to accumulate thick slough. The dorsal foot wound has merged with the lateral malleolar wound. She is experiencing significant pain in the dorsal foot portion of the ulcer. 01/16/2022: Absolutely no change or progress in her wounds. 01/24/2022: Her wounds are unchanged. She continues to build up slough and the wounds on her dorsal right foot are still exquisitely tender. 01/31/2022: The wounds actually measure a little bit  narrower today. They still have thick slough on the surface but the underlying tissue seems a little less fibrotic. We changed to Iodosorb last week. 02/07/2022: Wounds continue to slowly epithelialize around the parameters. She has less pain today. She still accumulates a fairly substantial layer of slough. 02/15/2022: No change to the wounds overall. The measured a little bit larger today per the intake nurse. She continues to have substantial slough accumulation and her pain is a little bit worse. 02/21/2022. No change at all to her wounds. I do not see that plastic surgery has received her referral yet. 02/28/2022: The wounds remain unchanged. They are dry and fibrotic with accumulation of slough and eschar. She did receive an appointment to see plastic surgery on October 23, but the office called her back and indicated they needed to reschedule and that the next available appointment was not until December. The patient became angry and decided she did not want to see this plastics group. 10/19; the patient sees Dr. Maylene Roes again next week. She is using Medihoney as the primary dressing changing this herself. 03/21/2022: She saw Dr. Ferd Hibbs at the wound care center at [facility] Dr. Ferd Hibbs was also unable to debride her, secondary to pain and is planning to take her to the operating room for operative debridement and potential skin substitute placement. Her wounds are unchanged with thick yellow slough and a fibrotic base. She says they are too painful to be debrided. In addition, yesterday her hemoglobin was 4 and she received 2 units packed red blood cell transfusion. 04/04/2022: Her operation at [facility] is scheduled for December 15. She continues to use Medihoney on her  wounds. They are a little bit less painful today and she is willing to entertain the possibility of debridement. The wounds measured a little bit larger in all dimensions today but the layer of slough is  not as thick as usual. 04/18/2022: Her wounds actually measured a little bit smaller today and the extension onto the dorsal foot from the lateral wound has healed. They are less tender and there is significantly less slough present as compared to prior visits. 05/09/2022: She had to reschedule her surgery due to lack of childcare. It is now planned for January 5. The wounds are basically unchanged, but she had a lot more drainage, which was thick and somewhat purulent in appearance. No significant odor. 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 They are quite painful today and she does not want to have debridement performed. 05/30/2022: The patient underwent surgical debridement and placement of TheraSkin to her wounds on January 5. She has been doing well and does not endorse any pain. The TheraSkin is intact and looks beautiful. It is sutured in place. There is no exudate or significant drainage. 06/04/2022: Her TheraSkin remains intact and I am starting to see granulation tissue emerging underneath the surface. No concern for infection. 06/12/2022: The TheraSkin is intact and there is more granulation tissue emerging. It is starting to look a little bit dry, however. No malodor or purulent drainage. 06/19/2022: The TheraSkin continues to look good. The edges are a bit dry, but the central portion of each of her wounds is showing a nice pink color. 06/27/2022: The TheraSkin on the lateral wound remains almost completely intact. There is nice pink tissue peaking through the mesh of the TheraSkin. On the medial leg, it is starting to breakdown a little bit, but most of the TheraSkin remains intact here, as well. Both wounds measured smaller today. 07/03/2022: The wounds are fairly dry around the perimeter today. Some of the suture is hanging loose. 07/12/2022: Both wounds are measuring smaller today. The medial ulcer is getting a bit too dry. The lateral ulcer continues to have nice buds of  granulation tissue emerging. 07/26/2022: The moisture balance on both wounds is much better today. For the first time since I began seeing her, I see actual beefy red granulation tissue on the lateral ankle wound. There are more buds of deep pink granulation tissue emerging on the medial ankle. There is some dry eschar around the edges of the medial wound and a tiny amount of slough overlying the good granulation tissue on the lateral wound. 08/09/2022: Unfortunately, she has a new wound on her left lateral lower leg, just above the ankle. It is extremely painful for her. It penetrates to the fat layer and has the same woody, fibrotic character as her previous wounds. The other wounds are both a little bit smaller with slough and some eschar around the periphery. 08/14/2022: Her right lateral ankle wound is a little bit smaller. Unfortunately, she has had an increase in her drainage and it has a purulent nature to it, similar to what was present prior to her surgical debridement. She also reports odor coming from her wounds this week. The left lateral leg wound remains extremely tender. 08/21/2022: The left lateral ankle wound is larger and more painful today. Both right ankle wounds have a little bit more vital appearance, but they have accumulated more slough. She reports less odor this week. She is currently taking Augmentin. 08/28/2022: The left lateral ankle wound continues to enlarge and remains  quite painful. Both of the right ankle wounds actually look better this week. There is improved tissue quality with less slough accumulation. 09/27/2022: The left lateral ankle wound actually looks nearly healed. She says that she has been applying some silver alginate that she had leftover. She was unable to afford the Santyl that was prescribed, but found a tube that she had on hand at home and has been using this on her right ankle wounds. These wounds look about the same and have accumulated the usual  layer of slough. Unfortunately, a portion of the dorsal foot wound has reopened and is exquisitely painful today. 10/09/2022: The left lateral ankle wound is healed. The dorsal foot wound appears to have expanded and remains exquisitely painful. The medial and lateral right ankle wounds look about the same size, but they are quite a bit cleaner. 10/23/2022: The left lateral ankle remains closed. The dorsal foot wound has expanded to the point that it now converges with the right lateral leg wound. She has accumulated fairly thick slough on all of the open surfaces. ADLEAN, NEWHARD ([id number]) [id number] Page 5 of 16 11/06/2022: No significant change to any of her wounds, although the dorsal foot aspect of the right lateral wound is a little bit smaller. She has been approved for the [employer] and [employer] for [employer] and will be able to get a full year's supply. 11/20/2022: The dorsal foot aspect of the right lateral wound has almost completely closed. I am seeing nice pink tissue starting to emerge from the fibrotic surface of both wounds. The medial leg wound appears to be a little bit smaller today. She has been using Santyl. 12/04/2022: The dorsal foot aspect of the right lateral wound has closed. There is more pink granulation tissue emerging on her wounds. There are buds of epithelium beginning to fill in from the edges of her wound, particularly at the proximal aspect of the medial wound and the distal aspect of the lateral wound. Significantly less slough formation than in the past. 12/18/2022: The wounds measured about the same size today, but there is better granulation tissue filling in and the overall surface consistency is improving. She does have slough accumulation on the surfaces, but no malodor or purulent drainage. 01/01/2023: No significant change to her wounds. The surface seems a little bit more fibrotic today. She is complaining of more  pain. 01/15/2023: Her wounds are stable today. She has been taking Augmentin based on the culture that I took at her previous visit. She says that she has felt physically better since being on the antibiotic and has had less drainage from her wounds. 01/29/2023: The wounds are unchanged and remained fibrotic and painful. She does report having less pain while on antibiotics and less drainage, as well. 02/07/2023: Her wounds have gotten larger and more painful. She completed her course of antibiotics yesterday. She still has a fair amount of drainage coming from her wounds. 02/24/2023: Her wounds continue to enlarge. They are more painful. 03/18/2023: Her wounds continue to enlarge and become more painful. The systemic antibiotics do not seem to have made any difference this round. She is having more drainage, as well. 04/01/2023: Her wounds are about the same size today. They have a thick layer of slough on both the medial and lateral aspect. Underneath, however, the tissue is not quite as pale as it usually appears. She does have 3 new wounds on her left lower leg, 1 lateral and 2 medial. They are covered with  slough and eschar and are exquisitely painful. She has been applying silver alginate to these at home. Her case was discussed in the [facility] conference recently. Several suggestions were made including Lidoderm patches, vascular referral for an angiogram, and potentially further surgery. They also suggested getting a TCOM done with the Lidoderm patch in situ. They agreed with the decision to put her on pentoxifylline. 04/14/2023: Her wounds have worsened across the board. All of them are larger and exquisitely painful. Her insurance would not cover the Lidoderm patches. She has an appointment with vascular surgery coming up next week. 05/05/2023: Her wounds continue to deteriorate. The left leg ulcers have merged and are now almost circumferential. The right leg ulcers are larger.  They are exquisitely painful. She paid for the lidocaine patches out-of-pocket but says they do not help. She met with vascular surgery and they felt it was unlikely that there was an arterial issue, but they are going to get lower extremity arterial Doppler studies and go from there. 06/09/2023: Her wounds are no better. They continue to be exquisitely tender. They are covered with a thick layer of slough. She says that the drainage seems to be increasing and that sometimes she notices an odor. Lower extremity arterial studies were completed and she does not have any compromise of her blood flow to these wounds. Electronic Signature(s) Signed: 06/09/2023 10:04:11 AM By: Duanne Guess MD FACS Entered By: Duanne Guess on 06/09/2023 10:04:11 -------------------------------------------------------------------------------- Physical Exam Details Patient Name: Date of Service: [name] [name], [name] 06/09/2023 9:15 A M Medical Record Number: [id number] Patient Account Number: [id number] Date of Birth/Sex: Treating RN: 1971-04-08 (53 y.o. F) Primary Care Provider: Julianne Handler Other Clinician: Referring Provider: Treating Provider/Extender: Koleen Nimrod Weeks in Treatment: 111 Constitutional Slightly hypertensive. Slightly tachycardic. . . no acute distress. Respiratory Normal work of breathing on room air.. Notes 06/09/2023: Her wounds are no better. They continue to be exquisitely tender. They are covered with a thick layer of slough. She says that the drainage seems to be increasing and that sometimes she notices an odor. Electronic Signature(s) Signed: 06/09/2023 10:08:16 AM By: Duanne Guess MD FACS Entered By: Duanne Guess on 06/09/2023 10:08:16 [name] ([id number]) [id number] Page 6 of 16 -------------------------------------------------------------------------------- Physician Orders Details Patient Name: Date of  Service: [name] [name] 06/09/2023 9:15 A M Medical Record Number: [id number] Patient Account Number: [id number] Date of Birth/Sex: Treating RN: [birth year] (53 y.o. Katrinka Blazing Primary Care Provider: Julianne Handler Other Clinician: Referring Provider: Treating Provider/Extender: Koleen Nimrod Weeks in Treatment: [phone] Verbal / Phone Orders: No Diagnosis Coding Follow-up Appointments ppointment in 2 weeks. - Dr. Lady Gary Return A Anesthetic (In clinic) Topical Lidocaine 4% applied to wound bed Bathing/ Shower/ Hygiene May shower and wash wound with soap and water. Additional Orders / Instructions Follow Nutritious Diet Juven Shake 1-2 times daily. Lymphedema Treatment Plan - Exercise, Compression and Elevation Elevate legs 30 - 60 minutes at or above heart level at least 3 - 4 times daily as able/tolerated Avoid standing for long periods and elevate leg(s) parallel to the floor when sitting Wound Treatment Wound #17 - Lower Leg Wound Laterality: Right, Lateral Cleanser: Soap and Water Every Other Day/30 Days Discharge Instructions: May shower and wash wound with dial antibacterial soap and water prior to dressing change. Cleanser: Wound Cleanser Every Other Day/30 Days Discharge Instructions: Cleanse the wound with wound cleanser prior to applying a clean dressing using gauze sponges, not tissue or  cotton balls. Peri-Wound Care: Triamcinolone 15 (g) Every Other Day/30 Days Discharge Instructions: Use triamcinolone 15 (g) as directed Peri-Wound Care: Sween Lotion (Moisturizing lotion) Every Other Day/30 Days Discharge Instructions: Apply moisturizing lotion as directed Topical: Gentamicin Every Other Day/30 Days Discharge Instructions: As directed by physician Topical: Mupirocin Ointment Every Other Day/30 Days Discharge Instructions: Apply Mupirocin (Bactroban) as instructed Prim Dressing: Santyl Ointment Every Other Day/30 Days ary Discharge  Instructions: 06/09/23 (on hold )Apply nickel thick amount to wound bed as instructed Secondary Dressing: ABD Pad, 5x9 Every Other Day/30 Days Discharge Instructions: Apply over primary dressing as directed. Secondary Dressing: Woven Gauze Sponge, Non-Sterile 4x4 in Every Other Day/30 Days Discharge Instructions: Moisten with Vashe and Apply to wound Secured With: Elastic Bandage 4 inch (ACE bandage) Every Other Day/30 Days Discharge Instructions: Secure with ACE bandage as directed. Secured With: [employer], 4.5x3.1 (in/yd) Every Other Day/30 Days Discharge Instructions: Secure with Kerlix as directed. Wound #21 - Ankle Wound Laterality: Right, Medial Cleanser: Soap and Water Every Other Day/30 Days Discharge Instructions: May shower and wash wound with dial antibacterial soap and water prior to dressing change. Cleanser: Wound Cleanser Every Other Day/30 Days Discharge Instructions: Cleanse the wound with wound cleanser prior to applying a clean dressing using gauze sponges, not tissue or cotton balls. LILLYANNAH, LEHL ([id number]) [id number] Page 7 of 16 Peri-Wound Care: Triamcinolone 15 (g) Every Other Day/30 Days Discharge Instructions: Use triamcinolone 15 (g) as directed Peri-Wound Care: Sween Lotion (Moisturizing lotion) Every Other Day/30 Days Discharge Instructions: Apply moisturizing lotion as directed Topical: Gentamicin Every Other Day/30 Days Discharge Instructions: As directed by physician Topical: Mupirocin Ointment Every Other Day/30 Days Discharge Instructions: Apply Mupirocin (Bactroban) as instructed Prim Dressing: Santyl Ointment Every Other Day/30 Days ary Discharge Instructions: 06/09/23 (on hold )Apply nickel thick amount to wound bed as instructed Secondary Dressing: ABD Pad, 5x9 Every Other Day/30 Days Discharge Instructions: Apply over primary dressing as directed. Secondary Dressing: Woven Gauze Sponge, Non-Sterile 4x4 in Every  Other Day/30 Days Discharge Instructions: Moisten with Vashe and Apply to wound Secured With: Elastic Bandage 4 inch (ACE bandage) Every Other Day/30 Days Discharge Instructions: Secure with ACE bandage as directed. Secured With: [employer], 4.5x3.1 (in/yd) Every Other Day/30 Days Discharge Instructions: Secure with Kerlix as directed. Wound #26 - Lower Leg Wound Laterality: Left, Lateral Cleanser: Soap and Water Every Other Day/30 Days Discharge Instructions: May shower and wash wound with dial antibacterial soap and water prior to dressing change. Cleanser: Wound Cleanser Every Other Day/30 Days Discharge Instructions: Cleanse the wound with wound cleanser prior to applying a clean dressing using gauze sponges, not tissue or cotton balls. Peri-Wound Care: Triamcinolone 15 (g) Every Other Day/30 Days Discharge Instructions: Use triamcinolone 15 (g) as directed Peri-Wound Care: Sween Lotion (Moisturizing lotion) Every Other Day/30 Days Discharge Instructions: Apply moisturizing lotion as directed Topical: Gentamicin Every Other Day/30 Days Discharge Instructions: As directed by physician Topical: Mupirocin Ointment Every Other Day/30 Days Discharge Instructions: Apply Mupirocin (Bactroban) as instructed Prim Dressing: Santyl Ointment Every Other Day/30 Days ary Discharge Instructions: 06/09/23 (on hold )Apply nickel thick amount to wound bed as instructed Secondary Dressing: ABD Pad, 5x9 Every Other Day/30 Days Discharge Instructions: Apply over primary dressing as directed. Secondary Dressing: Woven Gauze Sponge, Non-Sterile 4x4 in Every Other Day/30 Days Discharge Instructions: Moisten with Vashe and Apply to wound Secured With: Elastic Bandage 4 inch (ACE bandage) Every Other Day/30 Days Discharge Instructions: Secure with ACE bandage as directed. Secured With: [employer]  Sterile, 4.5x3.1 (in/yd) Every Other Day/30 Days Discharge Instructions: Secure with Kerlix as  directed. Wound #27 - Lower Leg Wound Laterality: Left, Medial Cleanser: Soap and Water Every Other Day/30 Days Discharge Instructions: May shower and wash wound with dial antibacterial soap and water prior to dressing change. Cleanser: Wound Cleanser Every Other Day/30 Days Discharge Instructions: Cleanse the wound with wound cleanser prior to applying a clean dressing using gauze sponges, not tissue or cotton balls. Peri-Wound Care: Triamcinolone 15 (g) Every Other Day/30 Days Discharge Instructions: Use triamcinolone 15 (g) as directed Peri-Wound Care: Sween Lotion (Moisturizing lotion) Every Other Day/30 Days Discharge Instructions: Apply moisturizing lotion as directed Topical: Gentamicin Every Other Day/30 Days Discharge Instructions: As directed by physician [name] ([id number]) [id number] Page 8 of 16 Topical: Mupirocin Ointment Every Other Day/30 Days Discharge Instructions: Apply Mupirocin (Bactroban) as instructed Prim Dressing: Santyl Ointment Every Other Day/30 Days ary Discharge Instructions: 06/09/23 (on hold )Apply nickel thick amount to wound bed as instructed Secondary Dressing: ABD Pad, 5x9 Every Other Day/30 Days Discharge Instructions: Apply over primary dressing as directed. Secondary Dressing: Woven Gauze Sponge, Non-Sterile 4x4 in Every Other Day/30 Days Discharge Instructions: Moisten with Vashe and Apply to wound Secured With: Elastic Bandage 4 inch (ACE bandage) Every Other Day/30 Days Discharge Instructions: Secure with ACE bandage as directed. Secured With: [employer], 4.5x3.1 (in/yd) Every Other Day/30 Days Discharge Instructions: Secure with Kerlix as directed. Wound #29 - Ankle Wound Laterality: Left, Medial Cleanser: Soap and Water Every Other Day/30 Days Discharge Instructions: May shower and wash wound with dial antibacterial soap and water prior to dressing change. Cleanser: Wound Cleanser Every Other Day/30  Days Discharge Instructions: Cleanse the wound with wound cleanser prior to applying a clean dressing using gauze sponges, not tissue or cotton balls. Peri-Wound Care: Triamcinolone 15 (g) Every Other Day/30 Days Discharge Instructions: Use triamcinolone 15 (g) as directed Peri-Wound Care: Sween Lotion (Moisturizing lotion) Every Other Day/30 Days Discharge Instructions: Apply moisturizing lotion as directed Topical: Gentamicin Every Other Day/30 Days Discharge Instructions: As directed by physician Topical: Mupirocin Ointment Every Other Day/30 Days Discharge Instructions: Apply Mupirocin (Bactroban) as instructed Prim Dressing: Santyl Ointment Every Other Day/30 Days ary Discharge Instructions: 06/09/23 (on hold )Apply nickel thick amount to wound bed as instructed Secondary Dressing: ABD Pad, 5x9 Every Other Day/30 Days Discharge Instructions: Apply over primary dressing as directed. Secondary Dressing: Woven Gauze Sponge, Non-Sterile 4x4 in Every Other Day/30 Days Discharge Instructions: Moisten with Vashe and Apply to wound Secured With: Elastic Bandage 4 inch (ACE bandage) Every Other Day/30 Days Discharge Instructions: Secure with ACE bandage as directed. Secured With: [employer], 4.5x3.1 (in/yd) Every Other Day/30 Days Discharge Instructions: Secure with Kerlix as directed. Electronic Signature(s) Signed: 06/09/2023 2:59:12 PM By: Duanne Guess MD FACS Entered By: Duanne Guess on 06/09/2023 10:08:56 -------------------------------------------------------------------------------- Problem List Details Patient Name: Date of Service: [name] [name], [name] 06/09/2023 9:15 A M Medical Record Number: [id number] Patient Account Number: [id number] Date of Birth/Sex: Treating RN: 1970/07/10 (53 y.o. 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 04/01/2023 No Yes severity D57.1 Sickle-cell disease without crisis 04/17/2021  No Yes Inactive Problems Resolved Problems ICD-10 Code Description Active Date Resolved Date L97.822 Non-pressure chronic ulcer of other part of left lower leg with fat layer exposed 08/09/2022 08/09/2022 Electronic Signature(s) Signed: 06/09/2023 10:02:17 AM By: Duanne Guess MD FACS Entered By: Duanne Guess on 06/09/2023 10:02:17 -------------------------------------------------------------------------------- Progress Note Details Patient Name: Date of Service: [name] [name], [name] 06/09/2023 9:15 A M Medical Record Number: [id number] Patient Account Number: [id number] Date of Birth/Sex: Treating RN: 05-Apr-1971 (53 y.o. F) Primary Care Provider: Julianne Handler Other Clinician: Referring Provider: Treating Provider/Extender: Koleen Nimrod Weeks in Treatment: 111 Subjective Chief Complaint Information obtained from Patient the patient is here for evaluation of her bilateral lower extremity sickle cell ulcers 04/17/2021; patient comes in for substantial wounds on the right and left lower leg History of Present Illness (HPI) The following HPI elements were documented for the patient's wound: Location: medial and lateral ankle region on the right and left medial malleolus Quality: Patient reports experiencing a shooting pain to affected area(s). Severity: Patient states wound(s) are getting worse. Duration: right lower extremity bimalleolar ulcers have been present for approximately 2 years; the right medial malleolus ulcer has been there  proximally 6 months Timing: Pain in wound is constant (hurts all the time) Context: The wound would happen gradually Associated Signs and Symptoms: Patient reports having increase discharge. 53 year old patient with a history of sickle cell anemia who was last seen by me with ulceration of the right lower extremity above the ankle and was referred to Dr. Marzetta Board for a surgical debridement as I was unable to do anything in the office due to excruciating pain. At that stage she was referred from the plastic surgery service to dermatology who treated her for a skin infection with doxycycline and then Levaquin and a local antibiotic ointment. I understand the patient has since developed ulceration on the left ankle both medial and lateral and was now referred back to the wound center as dermatology has finished the management. I do not have any notes from the dermatology department Old notes: 54 year old patient with a history of sickle cell anemia, pain bilateral lower extremities, right lower extremity ulcer and has a history of receiving a skin graft( [place]) several months ago. She has been visiting the wound center [facility] and was seen by Dr. Leanord Hawking and Dr. Marzetta Board. CHAREESE, VETH ([id number]) [id number] Page 10 of 16 after prolonged conservator therapy between July 2016 and January 2017. She had been seen by the plastic surgeon and taken to the OR for debridement and application of Theraskin. She had 3 applications of Theraskin and was then treated with collagen. Prior to that she had a history of similar problems in 2014 and was treated conservatively. Had a reflux study done for the right lower extremity in August 2016 without reflux or DVT . Past medical history significant for sickle cell disease, anemia, leg ulcers, cholelithiasis,and has never been a smoker. Once the patient was discharged on the wound center she says within 2 or 3 weeks the problems  recurred and she has been treating it conservatively. since I saw her 3 weeks ago at [facility] she has been unable to get her dressing material but has completed a course of doxycycline. 6/7/ 2017 -- lower extremity venous duplex reflux evaluation was done  No evidence of SVT or DVT in the RLL. No venous incompetence in the RLL. No further vascular workup is indicated at this time. She was seen by Dr. Mina Marble, on 10/04/2015. She  agreed with the plan of taking her to the OR for debridement and application of theraskin and would also take biopsies to rule out pyoderma gangrenosum. Follow-up note dated May 31 received and she was status post application of Theraskin to multiple ulcers around the right ankle. Pathology did not show evidence of malignancy or pyoderma gangrenosum. She would continue to see as in the wound clinic for further care and see Dr. Marzetta Board as needed. The patient brought the biopsy report and it was consistent with stasis ulcer no evidence of malignancy and the comment was that there was some adjacent neovascularization, fibrosis and patchy perivascular chronic inflammation. 11/15/2015 -- today we applied her first application of Theraskin 11/30/15; TheraSkin #2 12/13/2015 -- she is having a lot of pain locally and is here for possible application of a theraskin today. 01/16/2016 -- the patient has significant pain and has noticed despite in spite of all local care and oral pain medication. It is impossible to debride her in the office. 02/06/2016 -- I do not see any notes from Dr. Leta Baptist( the patient has not made a call to the office know as she heard from them) and the only visit to recently was with her PCP Dr. Gypsy Decant -- I saw her on 01/16/2016 and prescribed 90 tablets of oxycodone 10 mg and did lab work and screening for HIV. the HIV was negative and hemoglobin was 6.3 with a WBC count of 14.9 and hematocrit of 17.8 with platelets of 561. reticulocyte  count was 15.5% READMISSION: 07/10/2016- The patient is here for readmission for bilateral lower extremity ulcers in the presence of sickle cell. The bimalleolar ulcers to the right lower extremity have been present for approximately 2 years, the left medial malleolus ulcer has been present approximately 6 months. She has followed with Dr.Thimmappa in the past and has had a total of 3 applications of Theraskin (01/2015, 09/2015, 06/17/16). She has also followed with Dr. Meyer Russel here in the clinic and has received 2 applications of TheraSkin (11/10/15, 11/30/15). The patient does experience chronic, and is not amenable to debridement. 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 She is not currently on any antibiotic therapy and has not been treated with any recently. 07/17/2016 -- was seen by Dr. Leta Baptist of plastic surgery who saw her 2 weeks postop application of Theraskin #3. She had removed her dressing and asked her to apply silver alginate on alternate days and follow-up back with the wound center. Future debridements and application of skin substitute would have to be done in the hospital due to her high risk for anesthesia. READMISSION 04/17/2021 Patient is now a 53 year old woman that we have had in this clinic for a prolonged period of time and 2016-2017 and then again for 2 visits in February 2018. At that point she had wounds on the right lower leg predominantly medial. She had also been seen by plastic surgery Dr. Marzetta Board who I believe took her to the OR for operative debridement and application of TheraSkin in 2017. After she left our clinic she was followed for a very prolonged period of time in the wound care center in [facility] who then referred her ultimately to [facility] where she was seen by Dr. Mardene Speak. Again taken her to the OR for skin grafting which apparently did not take. She had multiple other attempts at dressings although I have not really  looked over all of these notes in great detail. She  has not been seen in a wound care center in about a year. She states over the last year in addition to her right lower leg she has developed wounds on the left lower leg quite extensive. She is using Xeroform to all of these wounds without really any improvement. She also has Medicaid which does not cover wound products. The patient has had vascular work-ups in the past including most recently on 03/28/2021 showing biphasic waveforms on the right triphasic at the PTA and biphasic at the dorsalis pedis on the left. She was unable to tolerate any degree of compression to do ABIs. Unfortunately TBI's were also not done. She had venous reflux studies done in 2017. This did not show any evidence of a DVT or SVT and no venous incompetence was noted in the right leg at the time this was the only side with the wound As noted I did not look all over her old records. She apparently had a course of HBO and Baptist although I am not sure what the indication would have been. In any case she developed seizures and terminated treatment earlier. She is generally much more disabled than when we last saw her in clinic. She can no longer walk pretty much wheelchair-bound because predominantly of pain in the left hip. 04/24/2021; the patient tolerated the wraps we put on. We used Santyl and Hydrofera Blue under compression. I brought her back for a nurse visit for a change in dressing. With Medicaid we will have a hard time getting anything paid for and hence the need for compression. She arrives in clinic with all the wounds looking somewhat better in terms of surface 12/20; circumferential wound on the right from the lateral to the medial. She has open areas on the left medial and left lateral x2 on all of this with the same surface. This does not look completely healthy although she does have some epithelialization. She is not complaining of a lot of pain which is  unusual for her sickle ulcers. I have not looked over her extensive records from [facility]. She had recent arterial studies and has a history of venous reflux studies I will need to look these over although I do not believe she has significant arterial disease 2023 05/22/2021; patient's wound areas measure slightly smaller. Still a lot of drainage coming from the right we have been using Hydrofera Blue and Santyl with some improvement in the wound surfaces. She tells me she will be getting transfused later in the week for her underlying sickle cell anemia I have looked over her recent arterial studies which were done in the fall. This was in November and showed biphasic and triphasic waveforms but she could not tolerate ABIs because of pressure and unfortunately TBI's were not done. She has not had recent venous reflux studies that I can see 1/10; not much change about the same surface area. This has a yellowish surface to it very gritty. We have been using Santyl and Hydrofera Blue for a prolonged period. Culture I did last week showed methicillin sensitive staph aureus "rare". Our intake nurse reports greenish drainage which may be the Hydrofera Blue itself 1/17; wounds are continue to measure smaller although I am not sure about the accuracy here. Especially the areas on the right are covered in what looks to be a nonviable surface although she does have some epithelialization. Similarly she has areas on the left medial and left lateral ankle area which appear to have a better surface and  perhaps are slightly smaller. We have been using Santyl and Hydrofera Blue. She cannot tolerate mechanical debridements She went for her reflux studies which showed significant reflux at the greater saphenous vein at the saphenofemoral junction as well as the greater saphenous vein in the proximal calf on the left she had reflux in the thigh and the common femoral vein and supra vein Fishel vein reflux in the greater  saphenous vein. I will have vein and vascular look at this. My thoughts have been that these are likely sickle wounds. I looked through her old records from [facility] wound care center and then when she graduated to [facility] wound care center where she saw Dr. Ronda Fairly and Dr. Mardene Speak. Although I can see she had reflux studies done I do not see that she actually saw a vein and vascular. I went over the fact that she had operative debridements and actual skin grafting that did not take. I do not think these wounds have ever really progressed towards healing RAIYN, BEVILL ([id number]) [phone].pdf Page 11 of 16 1/31;Substantial wounds on the right ankle area. Hyper granulated very gritty adherent debris on the surface. 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 On the left she has no DVT or SVT deep vein reflux in the common femoral vein. Superficial vein reflux in the greater saphenous vein on although the vein diameter is not really all that large. I do not think there is anything that can be done with these although I am going to send her for consultation to vein and vascular. 2/7; Wound exam; substantial wound area on the right posterior ankle area and areas on the left medial ankle and left lateral ankle. I was able to debride the left medial ankle last week fairly aggressively and it is back this week to a completely nonviable surface She will see vascular surgery this Friday and I would like them to review the venous studies and also any comments on her arterial status. If they do not see an issue here I  am going to refer her to plastic surgery for an operative debridement perhaps intraoperative ACell or Integra. Eventually she will require a deep tissue culture again 2/14; substantial wound area on the right posterior ankle, medial ankle. We have been using silver alginate The patient was seen by vein and vascular she had both venous reflux studies and arterial studies. 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 She had no evidence of superficial venous thrombosis. She did have superficial vein reflux at the saphenofemoral junction and the greater saphenous vein. On the left no evidence of a DVT no evidence of superficial venous throat thrombosis she did have deep vein reflux in the common femoral vein and superficial vein reflux in the greater saphenous vein but these were not felt to be amenable to ablation. In terms of arterial studies she had triphasic and wife biphasic wave waveforms bilaterally not felt to have a significant arterial issue. I do not get the feeling that they felt that any part of  her nonhealing wounds were related to either arterial or venous issues. They did note that she had venous reflux at the right at the [facility] and [facility]. And also on the left there were reflux in the deep system at the common femoral vein and greater saphenous vein in the proximal thigh. Nothing amenable to ablation. 2/20; she is making some decent progress on the right where there is nice skin between the 2 open areas on the right ankle. The surfaces here do not look viable yet there is some surrounding epithelialization. She still has a small area on the left medial ankle area. Hyper-granulated Jody's away always 2/28 patient has an appointment with plastic surgery on 3/8. We will see her back on 3/9. She may have to call us to get the area redressed. We've been using Santyl under silver alginate. We made a nice improvement on the  left medial ankle. The larger wounds on the right also looks somewhat better in terms of epithelialization although I think they could benefit from an aggressive debridement if plastic surgery would be willing to do that. Perhaps placement of Integra or a cell 07/26/2021: She saw Dr. Arita Miss yesterday. He raised the question as to whether or not this might be pyoderma and wanted to wait until that question was answered by dermatology before proceeding with any sort of operative debridement. We have continue to use Santyl under silver alginate with Kerlix and Coban wraps. Overall, her wounds appear to be continuing to contract and epithelialize, with some granulation tissue present. There continues to be some slough on all wound surfaces. 08/09/2021: She has not been able to get an appointment with dermatology because apparently the offices in [facility] and [place] do not accept Medicaid. She is looking into whether or not she can be seen at the main [facility] dermatology clinic. This is necessary because plastic surgery is concerned that her wounds might represent pyoderma and they did not want to do any procedure until that was clarified. We have been using Santyl under silver alginate with Kerlix and Coban wraps. Today, there was a greater amount of drainage on her dressings with a slight green discoloration and significant odor. Despite this, her wounds continue to contract and epithelialize. There is pale granulation tissue present and actually, on the left medial ankle, the granulation tissue is a bit hypertrophic. 08/16/2021: Last week, I took a culture and this grew back rare methicillin-resistant Staph aureus and rare corynebacterium. The MRSA was sensitive to gentamicin which we began applying topically on an empiric basis. This week, her wounds are a bit smaller and the drainage and odor are less. Her primary care provider is working on assisting the patient with a dermatology  evaluation. She has been in silver alginate over the gentamicin that was started last week along with Kerlix and Coban wraps. 08/23/2021: Because she has Medicaid, we have been unable to get her into see any dermatologist in the [place] to rule out pyoderma gangrenosum, which was a requirement from plastic surgery prior to any sort of debridement and grafting. Despite this, however, all of her wounds continue to get smaller. The wound on her left medial ankle is nearly closed. There is no odor from the wounds, although she still accumulates a modest amount of drainage on her dressings. 08/30/2021: The lateral right ankle wound and the medial left ankle wound are a bit smaller today. The medial right ankle wound is about the same size. They are less tender. We have  still been unable to get her into dermatology. 09/06/2021: All of the wounds are about the same size today. She continues to endorse minimal pain. I communicated with Dr. Arita Miss in plastic surgery regarding our issues getting a dermatology appointment; he was out of town but indicated that he would look into perhaps performing the biopsy in his office and will have his office contact her. 09/14/2021: The patient has an appointment in dermatology, but it is not until October. Her wounds are roughly the same; she continues to have very thick purulent-looking drainage on her dressings. 09/20/2021: The left medial wound is nearly closed and just has a bit of accumulated eschar on the surface. The right medial and lateral ankle wounds are perhaps a little bit smaller. They continue to have a very pale surface with accumulation of thin slough. 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 I did not think Jodie Echevaria was indicated based on this. She is getting topical mupirocin with Prisma silver collagen. 10/04/2021: The patient was not seen in clinic last week due to childcare coverage issues. In the interim, the left medial leg wound has  closed. The right sided leg wounds are smaller. There is more granulation tissue coming through, particularly on the lateral wound. The surface remains somewhat gritty. We have been applying topical mupirocin and Prisma silver collagen. 10/11/2021: The left medial leg wound remains closed. She does complain of some anesthetic sensation to the area. Both of the right-sided leg wounds are smaller but still have accumulated slough. 10/18/2021: Both right-sided leg wounds are minimally smaller this week. She still continues to accumulate slough and has thick drainage on her dressings. 10/23/2021: Both wounds continue to contract. There is still slough buildup. She has been approved for a keratin-based skin substitute trial product but it will not be available until next week. 10/30/2021: The wounds are about the same to perhaps slightly smaller. There is still continued slough buildup. Unfortunately, the rep for the keratin based product did not show up today and did not answer his phone when called. 11/08/2021: The wounds are little bit smaller today. She continues to have thick drainage but the surfaces are relatively clean with just a little bit of slough accumulation. She reported to me today that she is unable to completely flex her left ankle and on examination it seems this is potentially related to scar tissue from her wounds. We do have the ProgenaMatrix trial product available for her today. 11/15/2021: Both wounds are smaller today. There is some slough accumulation on the surfaces, but the medial wound, in particular looks like it is filling in and is less deep. She did hear from physical therapy and she is going to start working with them on July 11. She is here for her second application of the trial skin substitute, ProgenaMatrix. 11/22/2021: Both wounds continue to contract, the medial more dramatically than the lateral. Both wounds have a layer of slough on the surface, but underneath Packwood,  Felecia Shelling ([id number]) [id number].pdf Page 12 of 16 this, the gritty fibrous tissue has a little bit more of a pink cast to it rather than being as pale as it has been. 11/29/2021: The wounds are roughly the same size this week, perhaps a millimeter or 2 smaller. The medial wound has filled in and is nearly flush with the surrounding skin surface. She continues to have a lot of slough accumulation on both surfaces. 12/06/2021: No significant change to her wounds, but she has a new  opening on her dorsal foot, just distal to the right lateral ankle wound. The area on her left medial ankle that reopened looks a little bit larger today. She has quite a bit of pain associated with the new wound. 12/12/2021: Her wounds look about the same but the new opening on her right lateral dorsal foot is a little bit bigger. She continues to have a fair amount of pain with this wound. 12/26/2021: The left medial ankle wound is tiny and superficial. She has 2 areas of crusting on her left lateral ankle, however, that appear to be threatening to open again. Her right medial ankle wound is a little bit smaller today but still continues to accumulate thick rubbery slough. The new dorsal foot wound is exquisitely painful but there is no odor or purulent drainage. No erythema or induration. The right lateral ankle wound looks about the same today, again with thick rubbery slough. 01/03/2022: The left medial ankle wound has closed again. Both right ankle wounds appear to be about the same size with thick rubbery slough. The dorsal foot wound on the right continues to be quite painful and she stated that she did not want any debridement of that site today. 01/10/2022: No real change to any of her wounds. She continues to accumulate thick slough. The dorsal foot wound has merged with the lateral malleolar wound. She is experiencing significant pain in the dorsal foot portion of the ulcer. 01/16/2022: Absolutely no  change or progress in her wounds. 01/24/2022: Her wounds are unchanged. She continues to build up slough and the wounds on her dorsal right foot are still exquisitely tender. 01/31/2022: The wounds actually measure a little bit narrower today. They still have thick slough on the surface but the underlying tissue seems a little less fibrotic. We changed to Iodosorb last week. 02/07/2022: Wounds continue to slowly epithelialize around the parameters. She has less pain today. She still accumulates a fairly substantial layer of slough. 02/15/2022: No change to the wounds overall. The measured a little bit larger today per the intake nurse. She continues to have substantial slough accumulation and her pain is a little bit worse. 02/21/2022. No change at all to her wounds. I do not see that plastic surgery has received her referral yet. 02/28/2022: The wounds remain unchanged. They are dry and fibrotic with accumulation of slough and eschar. She did receive an appointment to see plastic surgery on October 23, but the office called her back and indicated they needed to reschedule and that the next available appointment was not until December. The patient became angry and decided she did not want to see this plastics group. 10/19; the patient sees Dr. Maylene Roes again next week. She is using Medihoney as the primary dressing changing this herself. 03/21/2022: She saw Dr. Ferd Hibbs at the wound care center at [facility]. Dr. Ferd Hibbs was also unable to debride her, secondary to pain and is planning to take her to the operating room for operative debridement and potential skin substitute placement. Her wounds are unchanged with thick yellow slough and a fibrotic base. She says they are too painful to be debrided. In addition, yesterday her hemoglobin was 4 and she received 2 units packed red blood cell transfusion. 04/04/2022: Her operation at [facility] is scheduled for December 15. She continues  to use Medihoney on her wounds. They are a little bit less painful today and she is willing to entertain the possibility of debridement. The wounds measured a little  bit larger in all dimensions today but the layer of slough is not as thick as usual. 04/18/2022: Her wounds actually measured a little bit smaller today and the extension onto the dorsal foot from the lateral wound has healed. They are less tender and there is significantly less slough present as compared to prior visits. 05/09/2022: She had to reschedule her surgery due to lack of childcare. It is now planned for January 5. The wounds are basically unchanged, but she had a lot more drainage, which was thick and somewhat purulent in appearance. No significant odor. 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 They are quite painful today and she does not want to have debridement performed. 05/30/2022: The patient underwent surgical debridement and placement of TheraSkin to her wounds on January 5. She has been doing well and does not endorse any pain. The TheraSkin is intact and looks beautiful. It is sutured in place. There is no exudate or significant drainage. 06/04/2022: Her TheraSkin remains intact and I am starting to see granulation tissue emerging underneath the surface. No concern for infection. 06/12/2022: The TheraSkin is intact and there is more granulation tissue emerging. It is starting to look a little bit dry, however. No malodor or purulent drainage. 06/19/2022: The TheraSkin continues to look good. The edges are a bit dry, but the central portion of each of her wounds is showing a nice pink color. 06/27/2022: The TheraSkin on the lateral wound remains almost completely intact. There is nice pink tissue peaking through the mesh of the TheraSkin. On the medial leg, it is starting to breakdown a little bit, but most of the TheraSkin remains intact here, as well. Both wounds measured smaller today. 07/03/2022: The wounds  are fairly dry around the perimeter today. Some of the suture is hanging loose. 07/12/2022: Both wounds are measuring smaller today. The medial ulcer is getting a bit too dry. The lateral ulcer continues to have nice buds of granulation tissue emerging. 07/26/2022: The moisture balance on both wounds is much better today. For the first time since I began seeing her, I see actual beefy red granulation tissue on the lateral ankle wound. There are more buds of deep pink granulation tissue emerging on the medial ankle. There is some dry eschar around the edges of the medial wound and a tiny amount of slough overlying the good granulation tissue on the lateral wound. 08/09/2022: Unfortunately, she has a new wound on her left lateral lower leg, just above the ankle. It is extremely painful for her. It penetrates to the fat layer and has the same woody, fibrotic character as her previous wounds. The other wounds are both a little bit smaller with slough and some eschar around the periphery. 08/14/2022: Her right lateral ankle wound is a little bit smaller. Unfortunately, she has had an increase in her drainage and it has a purulent nature to it, similar to what was present prior to her surgical debridement. She also reports odor coming from her wounds this week. The left lateral leg wound remains extremely tender. 08/21/2022: The left lateral ankle wound is larger and more painful today. Both right ankle wounds have a little bit more vital appearance, but they have accumulated more slough. She reports less odor this week. She is currently taking Augmentin. 08/28/2022: The left lateral ankle wound continues to enlarge and remains quite painful. Both of the right ankle wounds actually look better this week. There is improved tissue quality with less slough accumulation. GENOVA, MARCHBANK ([id number])  [phone].pdf Page 13 of 16 09/27/2022: The left lateral ankle wound actually looks nearly  healed. She says that she has been applying some silver alginate that she had leftover. She was unable to afford the Santyl that was prescribed, but found a tube that she had on hand at home and has been using this on her right ankle wounds. These wounds look about the same and have accumulated the usual layer of slough. Unfortunately, a portion of the dorsal foot wound has reopened and is exquisitely painful today. 10/09/2022: The left lateral ankle wound is healed. The dorsal foot wound appears to have expanded and remains exquisitely painful. The medial and lateral right ankle wounds look about the same size, but they are quite a bit cleaner. 10/23/2022: The left lateral ankle remains closed. The dorsal foot wound has expanded to the point that it now converges with the right lateral leg wound. She has accumulated fairly thick slough on all of the open surfaces. 11/06/2022: No significant change to any of her wounds, although the dorsal foot aspect of the right lateral wound is a little bit smaller. She has been approved for the [employer] and [employer] for [employer] and will be able to get a full year's supply. 11/20/2022: The dorsal foot aspect of the right lateral wound has almost completely closed. I am seeing nice pink tissue starting to emerge from the fibrotic surface of both wounds. The medial leg wound appears to be a little bit smaller today. She has been using Santyl. 12/04/2022: The dorsal foot aspect of the right lateral wound has closed. There is more pink granulation tissue emerging on her wounds. There are buds of epithelium beginning to fill in from the edges of her wound, particularly at the proximal aspect of the medial wound and the distal aspect of the lateral wound. Significantly less slough formation than in the past. 12/18/2022: The wounds measured about the same size today, but there is better granulation tissue filling in and the overall surface consistency is  improving. She does have slough accumulation on the surfaces, but no malodor or purulent drainage. 01/01/2023: No significant change to her wounds. The surface seems a little bit more fibrotic today. She is complaining of more pain. 01/15/2023: Her wounds are stable today. She has been taking Augmentin based on the culture that I took at her previous visit. She says that she has felt physically better since being on the antibiotic and has had less drainage from her wounds. 01/29/2023: The wounds are unchanged and remained fibrotic and painful. She does report having less pain while on antibiotics and less drainage, as well. 02/07/2023: Her wounds have gotten larger and more painful. She completed her course of antibiotics yesterday. She still has a fair amount of drainage coming from her wounds. 02/24/2023: Her wounds continue to enlarge. They are more painful. 03/18/2023: Her wounds continue to enlarge and become more painful. The systemic antibiotics do not seem to have made any difference this round. She is having more drainage, as well. 04/01/2023: Her wounds are about the same size today. They have a thick layer of slough on both the medial and lateral aspect. Underneath, however, the tissue is not quite as pale as it usually appears. She does have 3 new wounds on her left lower leg, 1 lateral and 2 medial. They are covered with slough and eschar and are exquisitely painful. She has been applying silver alginate to these at home. Her case was discussed in the [facility] conference recently. Several suggestions were made including Lidoderm patches, vascular referral for an angiogram, and potentially further surgery. They also suggested getting a TCOM done with the Lidoderm patch in situ. They agreed with the decision to put her on pentoxifylline. 04/14/2023: Her wounds have worsened across the board. All of them are larger and exquisitely painful. Her insurance would not cover the Lidoderm  patches. She has an appointment with vascular surgery coming up next week. 05/05/2023: Her wounds continue to deteriorate. The left leg ulcers have merged and are now almost circumferential. The right leg ulcers are larger. They are exquisitely painful. She paid for the lidocaine patches out-of-pocket but says they do not help. She met with vascular surgery and they felt it was unlikely that there was an arterial issue, but they are going to get lower extremity arterial Doppler studies and go from there. 06/09/2023: Her wounds are no better. They continue to be exquisitely tender. They are covered with a thick layer of slough. She says that the drainage seems to be increasing and that sometimes she notices an odor. Lower extremity arterial studies were completed and she does not have any compromise of her blood flow to these wounds. Objective Constitutional Slightly hypertensive. Slightly tachycardic. no acute distress. Vitals Time Taken: 9:16 AM, Height: 67 in, Weight: 134 lbs, BMI: 21, Temperature: 98 F, Pulse: 102 bpm, Respiratory Rate: 18 breaths/min, Blood Pressure: 142/65 mmHg. Respiratory Normal work of breathing on room air.. General Notes: 06/09/2023: Her wounds are no better. They continue to be exquisitely tender. They are covered with a thick layer of slough. She says that the drainage seems to be increasing and that sometimes she notices an odor. Integumentary (Hair, Skin) Wound #17 status is Open. Original cause of wound was Gradually Appeared. The date acquired was: 10/05/2012. The wound has been in treatment 111 weeks. The wound is located on the Right,Lateral Lower Leg. The wound measures 10.3cm length x 6.5cm width x 0.2cm depth; 52.582cm^2 area and 10.516cm^3 volume. There is Fat Layer (Subcutaneous Tissue) exposed. There is no tunneling or undermining noted. There is a large amount of serosanguineous drainage noted. The wound margin is distinct with the outline attached to the  wound base. There is medium (34-66%) pink granulation within the wound bed. There is a medium (34-66%) amount of necrotic tissue within the wound bed including Adherent Slough. The periwound skin appearance had no abnormalities noted for moisture. The periwound skin appearance exhibited: Scarring, Hemosiderin Staining. The periwound skin appearance did not exhibit: Ecchymosis. Periwound temperature was noted as No Abnormality. The periwound has tenderness on palpation. Wound #21 status is Open. Original cause of wound was Gradually Appeared. The date acquired was: 06/26/2021. The wound has been in treatment 101 weeks. The wound is located on the Right,Medial Ankle. The wound measures 13.2cm length x 7cm width x 0.2cm depth; 72.571cm^2 area and 14.514cm^3 volume. TOMIEKA, SCHAFF ([id number]) [id number] Page 14 of 16 There is Fat Layer (Subcutaneous Tissue) exposed. There is no tunneling or undermining noted. There is a large amount of serosanguineous drainage noted. The wound margin is distinct with the outline attached to the wound base. There is small (1-33%) pink granulation within the wound bed. There is a large (67- 100%) amount of necrotic tissue within the wound bed including Adherent Slough. The periwound skin appearance had no abnormalities noted for moisture. The periwound skin appearance had no abnormalities noted for color. The periwound skin appearance exhibited: Scarring. Periwound temperature was noted as No Abnormality. The periwound  has tenderness on palpation. Wound #26 status is Open. Original cause of wound was Gradually Appeared. The date acquired was: 03/18/2023. The wound has been in treatment 9 weeks. The wound is located on the Left,Lateral Lower Leg. The wound measures 8.4cm length x 5.7cm width x 0.1cm depth; 37.605cm^2 area and 3.76cm^3 volume. There is Fat Layer (Subcutaneous Tissue) exposed. There is no tunneling or undermining noted. There is a  medium amount of serous drainage noted. The wound margin is distinct with the outline attached to the wound base. There is small (1-33%) pink granulation within the wound bed. There is a large (67-100%) amount of necrotic tissue within the wound bed including Adherent Slough. The periwound skin appearance had no abnormalities noted for moisture. The periwound skin appearance had no abnormalities noted for color. The periwound skin appearance exhibited: Scarring. Periwound temperature was noted as No Abnormality. The periwound has tenderness on palpation. Wound #27 status is Open. Original cause of wound was Gradually Appeared. The date acquired was: 03/18/2023. The wound has been in treatment 9 weeks. The wound is located on the Left,Medial Lower Leg. The wound measures 3.5cm length x 2.5cm width x 0.1cm depth; 6.872cm^2 area and 0.687cm^3 volume. There is Fat Layer (Subcutaneous Tissue) exposed. There is no tunneling or undermining noted. There is a medium amount of serous drainage noted. The wound margin is distinct with the outline attached to the wound base. There is small (1-33%) pink granulation within the wound bed. There is a large (67-100%) amount of necrotic tissue within the wound bed including Adherent Slough. The periwound skin appearance had no abnormalities noted for moisture. The periwound skin appearance had no abnormalities noted for color. The periwound skin appearance exhibited: Scarring. Periwound temperature was noted as No Abnormality. The periwound has tenderness on palpation. Wound #29 status is Open. Original cause of wound was Gradually Appeared. The date acquired was: 05/05/2023. The wound has been in treatment 5 weeks. The wound is located on the Left,Medial Ankle. The wound measures 5.4cm length x 6cm width x 0.1cm depth; 25.447cm^2 area and 2.545cm^3 volume. There is Fat Layer (Subcutaneous Tissue) exposed. There is no tunneling or undermining noted. There is a medium  amount of serosanguineous drainage noted. The wound margin is distinct with the outline attached to the wound base. There is small (1-33%) pink granulation within the wound bed. There is a large (67-100%) amount of necrotic tissue within the wound bed including Eschar and Adherent Slough. The periwound skin appearance did not exhibit: Callus, Crepitus, Excoriation, Induration, Rash, Scarring, Dry/Scaly, Maceration, Atrophie Blanche, Cyanosis, Ecchymosis, Hemosiderin Staining, Mottled, Pallor, Rubor, Erythema. Periwound temperature was noted as No Abnormality. The periwound has tenderness on palpation. 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 - Dr. Lady Gary Anesthetic: (In clinic) Topical Lidocaine 4% applied to wound bed Bathing/ Shower/ Hygiene: May shower and wash wound with soap and water. Additional Orders / Instructions: Follow Nutritious Diet Juven Shake 1-2 times daily. Lymphedema Treatment Plan - Exercise, Compression and Elevation: Elevate legs 30 - 60 minutes at or above heart level at least 3 - 4 times daily as able/tolerated Avoid standing for long periods and elevate leg(s) parallel to the floor when sitting WOUND #17: - Lower Leg Wound Laterality: Right, Lateral Cleanser: Soap and Water Every Other Day/30 Days Discharge  Instructions: May shower and wash wound with dial antibacterial soap and water prior to dressing change. Cleanser: Wound Cleanser Every Other Day/30 Days Discharge Instructions: Cleanse the wound with wound cleanser prior to applying a clean dressing using gauze sponges, not tissue or cotton balls. Peri-Wound Care: Triamcinolone 15 (g) Every Other Day/30 Days Discharge Instructions: Use  triamcinolone 15 (g) as directed Peri-Wound Care: Sween Lotion (Moisturizing lotion) Every Other Day/30 Days Discharge Instructions: Apply moisturizing lotion as directed Topical: Gentamicin Every Other Day/30 Days Discharge Instructions: As directed by physician Topical: Mupirocin Ointment Every Other Day/30 Days Discharge Instructions: Apply Mupirocin (Bactroban) as instructed Prim Dressing: Santyl Ointment Every Other Day/30 Days ary Discharge Instructions: 06/09/23 (on hold )Apply nickel thick amount to wound bed as instructed Secondary Dressing: ABD Pad, 5x9 Every Other Day/30 Days Discharge Instructions: Apply over primary dressing as directed. Secondary Dressing: Woven Gauze Sponge, Non-Sterile 4x4 in Every Other Day/30 Days Discharge Instructions: Moisten with Vashe and Apply to wound Secured With: Elastic Bandage 4 inch (ACE bandage) Every Other Day/30 Days Discharge Instructions: Secure with ACE bandage as directed. Secured With: [employer], 4.5x3.1 (in/yd) Every Other Day/30 Days Discharge Instructions: Secure with Kerlix as directed. WOUND #21: - Ankle Wound Laterality: Right, Medial Cleanser: Soap and Water Every Other Day/30 Days Discharge Instructions: May shower and wash wound with dial antibacterial soap and water prior to dressing change. Cleanser: Wound Cleanser Every Other Day/30 Days Discharge Instructions: Cleanse the wound with wound cleanser prior to applying a clean dressing using gauze sponges, not tissue or cotton balls. Peri-Wound Care: Triamcinolone 15 (g) Every Other Day/30 Days KMARI, KASPRZAK ([id number]) [phone].pdf Page 15 of 16 Discharge Instructions: Use triamcinolone 15 (g) as directed Peri-Wound Care: Sween Lotion (Moisturizing lotion) Every Other Day/30 Days Discharge Instructions: Apply moisturizing lotion as directed Topical: Gentamicin Every Other Day/30 Days Discharge Instructions: As directed by  physician Topical: Mupirocin Ointment Every Other Day/30 Days Discharge Instructions: Apply Mupirocin (Bactroban) as instructed Prim Dressing: Santyl Ointment Every Other Day/30 Days ary Discharge Instructions: 06/09/23 (on hold )Apply nickel thick amount to wound bed as instructed Secondary Dressing: ABD Pad, 5x9 Every Other Day/30 Days Discharge Instructions: Apply over primary dressing as directed. Secondary Dressing: Woven Gauze Sponge, Non-Sterile 4x4 in Every Other Day/30 Days Discharge Instructions: Moisten with Vashe and Apply to wound Secured With: Elastic Bandage 4 inch (ACE bandage) Every Other Day/30 Days Discharge Instructions: Secure with ACE bandage as directed. Secured With: [employer], 4.5x3.1 (in/yd) Every Other Day/30 Days Discharge Instructions: Secure with Kerlix as directed. WOUND #26: - Lower Leg Wound Laterality: Left, Lateral Cleanser: Soap and Water Every Other Day/30 Days Discharge Instructions: May shower and wash wound with dial antibacterial soap and water prior to dressing change. Cleanser: Wound Cleanser Every Other Day/30 Days Discharge Instructions: Cleanse the wound with wound cleanser prior to applying a clean dressing using gauze sponges, not tissue or cotton balls. Peri-Wound Care: Triamcinolone 15 (g) Every Other Day/30 Days Discharge Instructions: Use triamcinolone 15 (g) as directed Peri-Wound Care: Sween Lotion (Moisturizing lotion) Every Other Day/30 Days Discharge Instructions: Apply moisturizing lotion as directed Topical: Gentamicin Every Other Day/30 Days Discharge Instructions: As directed by physician Topical: Mupirocin Ointment Every Other Day/30 Days Discharge Instructions: Apply Mupirocin (Bactroban) as instructed Prim Dressing: Santyl Ointment Every Other Day/30 Days ary Discharge Instructions: 06/09/23 (on hold )Apply nickel thick amount to wound bed as instructed Secondary Dressing: ABD Pad, 5x9 Every Other Day/30  Days Discharge Instructions: Apply over primary dressing as directed. Secondary  Dressing: Woven Gauze Sponge, Non-Sterile 4x4 in Every Other Day/30 Days Discharge Instructions: Moisten with Vashe and Apply to wound Secured With: Elastic Bandage 4 inch (ACE bandage) Every Other Day/30 Days Discharge Instructions: Secure with ACE bandage as directed. Secured With: [employer], 4.5x3.1 (in/yd) Every Other Day/30 Days Discharge Instructions: Secure with Kerlix as directed. WOUND #27: - Lower Leg Wound Laterality: Left, Medial Cleanser: Soap and Water Every Other Day/30 Days Discharge Instructions: May shower and wash wound with dial antibacterial soap and water prior to dressing change. Cleanser: Wound Cleanser Every Other Day/30 Days Discharge Instructions: Cleanse the wound with wound cleanser prior to applying a clean dressing using gauze sponges, not tissue or cotton balls. Peri-Wound Care: Triamcinolone 15 (g) Every Other Day/30 Days Discharge Instructions: Use triamcinolone 15 (g) as directed Peri-Wound Care: Sween Lotion (Moisturizing lotion) Every Other Day/30 Days Discharge Instructions: Apply moisturizing lotion as directed Topical: Gentamicin Every Other Day/30 Days Discharge Instructions: As directed by physician Topical: Mupirocin Ointment Every Other Day/30 Days Discharge Instructions: Apply Mupirocin (Bactroban) as instructed Prim Dressing: Santyl Ointment Every Other Day/30 Days ary Discharge Instructions: 06/09/23 (on hold )Apply nickel thick amount to wound bed as instructed Secondary Dressing: ABD Pad, 5x9 Every Other Day/30 Days Discharge Instructions: Apply over primary dressing as directed. Secondary Dressing: Woven Gauze Sponge, Non-Sterile 4x4 in Every Other Day/30 Days Discharge Instructions: Moisten with Vashe and Apply to wound Secured With: Elastic Bandage 4 inch (ACE bandage) Every Other Day/30 Days Discharge Instructions: Secure with ACE bandage as  directed. Secured With: [employer], 4.5x3.1 (in/yd) Every Other Day/30 Days Discharge Instructions: Secure with Kerlix as directed. WOUND #29: - Ankle Wound Laterality: Left, Medial Cleanser: Soap and Water Every Other Day/30 Days Discharge Instructions: May shower and wash wound with dial antibacterial soap and water prior to dressing change. Cleanser: Wound Cleanser Every Other Day/30 Days Discharge Instructions: Cleanse the wound with wound cleanser prior to applying a clean dressing using gauze sponges, not tissue or cotton balls. Peri-Wound Care: Triamcinolone 15 (g) Every Other Day/30 Days Discharge Instructions: Use triamcinolone 15 (g) as directed Peri-Wound Care: Sween Lotion (Moisturizing lotion) Every Other Day/30 Days Discharge Instructions: Apply moisturizing lotion as directed Topical: Gentamicin Every Other Day/30 Days Discharge Instructions: As directed by physician Topical: Mupirocin Ointment Every Other Day/30 Days Discharge Instructions: Apply Mupirocin (Bactroban) as instructed Prim Dressing: Santyl Ointment Every Other Day/30 Days ary Discharge Instructions: 06/09/23 (on hold )Apply nickel thick amount to wound bed as instructed Secondary Dressing: ABD Pad, 5x9 Every Other Day/30 Days Discharge Instructions: Apply over primary dressing as directed. Secondary Dressing: Woven Gauze Sponge, Non-Sterile 4x4 in Every Other Day/30 Days Discharge Instructions: Moisten with Vashe and Apply to wound Secured With: Elastic Bandage 4 inch (ACE bandage) Every Other Day/30 Days Discharge Instructions: Secure with ACE bandage as directed. Secured With: [employer], 4.5x3.1 (in/yd) Every Other Day/30 Days Discharge Instructions: Secure with Kerlix as directed. 06/09/2023: Her wounds are no better. They continue to be exquisitely tender. They are covered with a thick layer of slough. She says that the drainage seems to be increasing and that sometimes she notices an  odor. Lower extremity arterial studies were completed and she does not have any compromise of her blood flow to these wounds. YURELI, ARNOW ([id number]) [id number] Page 16 of 16 She was in too much pain to permit debridement today. I am going to change her dressings to just Vashe or Dakin's wet-to-dry for the next couple of weeks to see if  we can get any improvement. Unfortunately, without being able to get a good debridement, we are a little bit limited in what we can offer her here. The Santyl has not seemed to make much of a difference. She did ask whether or not she needed an antibiotic, but without good culture data, I do not think I want to pursue that just yet. I want to see how she does with the Dakin's/Vashe first and I will reevaluate at her next visit, whether or not a systemic antibiotic might be warranted. Follow-up in 2 weeks. Electronic Signature(s) Signed: 06/09/2023 10:15:45 AM By: Duanne Guess MD FACS Previous Signature: 06/09/2023 10:09:14 AM Version By: Duanne Guess MD FACS Entered By: Duanne Guess on 06/09/2023 10:15:45 -------------------------------------------------------------------------------- SuperBill Details Patient Name: Date of Service: [name], [name] 06/09/2023 Medical Record Number: [id number] Patient Account Number: [id number] Date of Birth/Sex: Treating RN: 1970/07/19 (53 y.o. F) Primary Care Provider: Julianne Handler Other Clinician: Referring Provider: Treating Provider/Extender: Koleen Nimrod Weeks in Treatment: 111 Diagnosis Coding ICD-10 Codes Code Description [phone] Non-pressure chronic ulcer of other part of right lower leg with other specified severity L97.828 Non-pressure chronic ulcer of other part of left lower leg with other specified severity D57.1 Sickle-cell disease without crisis Physician Procedures : CPT4 Code Description Modifier [id number] - WC PHYS LEVEL 3 - EST PT  ICD-10 Diagnosis Description L97.818 Non-pressure chronic ulcer of other part of right lower leg with other specified severity L97.828 Non-pressure chronic ulcer of other part of  left lower leg with other specified severity D57.1 Sickle-cell disease without crisis Quantity: 1 Electronic Signature(s) Signed: 06/09/2023 10:15:59 AM By: Duanne Guess MD FACS Entered By: Duanne Guess on 06/09/2023 10:15:58

## 2023-06-20 ENCOUNTER — Other Ambulatory Visit: Payer: Self-pay | Admitting: Nurse Practitioner

## 2023-06-20 DIAGNOSIS — G894 Chronic pain syndrome: Secondary | ICD-10-CM

## 2023-06-20 DIAGNOSIS — D5700 Hb-SS disease with crisis, unspecified: Secondary | ICD-10-CM

## 2023-06-20 MED ORDER — OXYCODONE HCL 10 MG PO TABS: 10.0000 mg | ORAL_TABLET | ORAL | 0 refills | Status: DC | PRN

## 2023-06-20 NOTE — Telephone Encounter (Signed)
Copied from CRM [id number]. Topic: Clinical - Medication Refill >> Jun 20, 2023  1:21 PM Phill Myron wrote: Most Recent Primary Care Visit:  Provider: Massie Maroon  Department: [facility]  Visit Type: OFFICE VISIT  Date: 04/01/2023  Medication:Oxycodone HCl 10 MG TABS   Has the patient contacted their pharmacy? No (Agent: If no, request that the patient contact the pharmacy for the refill. If patient does not wish to contact the pharmacy document the reason why and proceed with request.) (Agent: If yes, when and what did the pharmacy advise?)  Is this the correct pharmacy for this prescription? Yes If no, delete pharmacy and type the correct one.  This is the patient's preferred pharmacy:   [facility]ORE #[id number] [facility], [place] - [address] AT [facility] [address] [place] [address] Phone: [phone] Fax: [phone]  Has the prescription been filled recently? Yes  Is the patient out of the medication? Yes  Has the patient been seen for an appointment in the last year OR does the patient have an upcoming appointment? Yes  Can we respond through MyChart? No  Agent: Please be advised that Rx refills may take up to 3 business days. We ask that you follow-up with your pharmacy.

## 2023-06-23 ENCOUNTER — Encounter (HOSPITAL_BASED_OUTPATIENT_CLINIC_OR_DEPARTMENT_OTHER): Payer: Medicaid Other | Attending: General Surgery | Admitting: General Surgery

## 2023-06-23 DIAGNOSIS — L97818 Non-pressure chronic ulcer of other part of right lower leg with other specified severity: Secondary | ICD-10-CM | POA: Diagnosis not present

## 2023-06-23 DIAGNOSIS — D571 Sickle-cell disease without crisis: Secondary | ICD-10-CM | POA: Insufficient documentation

## 2023-06-23 DIAGNOSIS — L97828 Non-pressure chronic ulcer of other part of left lower leg with other specified severity: Secondary | ICD-10-CM | POA: Insufficient documentation

## 2023-07-08 ENCOUNTER — Other Ambulatory Visit: Payer: Self-pay | Admitting: Nurse Practitioner

## 2023-07-08 ENCOUNTER — Encounter: Payer: Self-pay | Admitting: Nurse Practitioner

## 2023-07-08 ENCOUNTER — Other Ambulatory Visit (HOSPITAL_COMMUNITY): Payer: Self-pay | Admitting: General Surgery

## 2023-07-08 ENCOUNTER — Ambulatory Visit: Payer: Medicaid Other | Admitting: Nurse Practitioner

## 2023-07-08 ENCOUNTER — Ambulatory Visit (HOSPITAL_COMMUNITY)
Admission: RE | Admit: 2023-07-08 | Discharge: 2023-07-08 | Disposition: A | Discharge status: Home / Self Care | Payer: Medicaid Other | Source: Ambulatory Visit | Attending: General Surgery | Admitting: General Surgery

## 2023-07-08 VITALS — BP 111/45 | HR 78 | Temp 97.4°F | Wt 121.0 lb

## 2023-07-08 DIAGNOSIS — L97929 Non-pressure chronic ulcer of unspecified part of left lower leg with unspecified severity: Secondary | ICD-10-CM | POA: Diagnosis not present

## 2023-07-08 DIAGNOSIS — L97828 Non-pressure chronic ulcer of other part of left lower leg with other specified severity: Secondary | ICD-10-CM | POA: Diagnosis present

## 2023-07-08 DIAGNOSIS — L97919 Non-pressure chronic ulcer of unspecified part of right lower leg with unspecified severity: Secondary | ICD-10-CM | POA: Diagnosis not present

## 2023-07-08 DIAGNOSIS — D5700 Hb-SS disease with crisis, unspecified: Secondary | ICD-10-CM | POA: Diagnosis not present

## 2023-07-08 DIAGNOSIS — D571 Sickle-cell disease without crisis: Secondary | ICD-10-CM | POA: Diagnosis present

## 2023-07-08 DIAGNOSIS — L97818 Non-pressure chronic ulcer of other part of right lower leg with other specified severity: Secondary | ICD-10-CM | POA: Insufficient documentation

## 2023-07-08 DIAGNOSIS — G894 Chronic pain syndrome: Secondary | ICD-10-CM | POA: Diagnosis not present

## 2023-07-08 MED ORDER — FOLIC ACID 1 MG PO TABS: 1.0000 mg | ORAL_TABLET | Freq: Every day | ORAL | 3 refills | Status: AC

## 2023-07-08 MED ORDER — GABAPENTIN 600 MG PO TABS: 600.0000 mg | ORAL_TABLET | Freq: Three times a day (TID) | ORAL | 2 refills | Status: DC

## 2023-07-08 MED ORDER — IBUPROFEN 600 MG PO TABS: 600.0000 mg | ORAL_TABLET | Freq: Three times a day (TID) | ORAL | 5 refills | Status: DC | PRN

## 2023-07-08 MED ORDER — KETOROLAC TROMETHAMINE 30 MG/ML IJ SOLN
30.0000 mg | Freq: Once | INTRAMUSCULAR | Status: AC
  Administered 2023-07-08: 30 mg via INTRAMUSCULAR

## 2023-07-08 MED ORDER — OXYCODONE HCL 10 MG PO TABS: 10.0000 mg | ORAL_TABLET | ORAL | 0 refills | Status: DC | PRN

## 2023-07-08 NOTE — Assessment & Plan Note (Signed)
Sickle cell disease - Continue  Folic acid 1 mg daily to prevent aplastic bone marrow crises.  The patient was reminded of the need to seek medical attention of any symptoms of bleeding, anemia, or infection.    Immunization status -due for influenza vaccine, shingles vaccine, pneumonia vaccine, need for all vaccines discussed.  Patient declined vaccines today  Acute and chronic painful episodes -  .  We discussed that pt is to receive her Schedule II prescriptions only from [place]. Pt is also aware that the prescription history is available to [place] online through the [facility] CSRS. We reminded the patient that all patients receiving Schedule II narcotics must be seen for follow within the 3 months in the office.  We reviewed the terms of our pain agreement, including the need to keep medicines in a safe locked location away from children or pets, and the need to report excess sedation or constipation, measures to avoid constipation, and policies related to early refills and stolen prescriptions. According to the [place] Chronic Pain Initiative program, we have reviewed details related to analgesia, adverse effects, aberrant behaviors. Reviewed [place] Substance Reporting system prior to prescribing opiate medication, no inconsistencies noted.   Increase gabapentin to 600 mg 3 times daily, continue oxycodone 10 mg every 4 hours as needed, ibuprofen 600 mg every 8 hours as needed.  Toradol 30 mg injection one-time dose given in the office today Iron overload from chronic transfusion.  Will check ferritin levels.  Has dietary from ordered but is not taking the medication  1. Sickle-cell disease with pain (HCC) (Primary)  - Sickle Cell Panel - Reticulocytes - Oxycodone HCl 10 MG TABS; Take 1 tablet (10 mg total) by mouth every 4 (four) hours as needed for up to 15 days.  Dispense: 90 tablet; Refill: 0 - gabapentin (NEURONTIN) 600 MG tablet; Take 1 tablet (600 mg total) by mouth 3 (three) times daily.  Dispense: 90 tablet;  Refill: 2 - folic acid (FOLVITE) 1 MG tablet; Take 1 tablet (1 mg total) by mouth daily.  Dispense: 90 tablet; Refill: 3 - ibuprofen (ADVIL) 600 MG tablet; Take 1 tablet (600 mg total) by mouth every 8 (eight) hours as needed.  Dispense: 30 tablet; Refill: 5 - Ambulatory referral to Hematology / Oncology  2. Chronic pain syndrome  - Sickle Cell Panel - Reticulocytes - ToxAssure Flex 15, Ur - Oxycodone HCl 10 MG TABS; Take 1 tablet (10 mg total) by mouth every 4 (four) hours as needed for up to 15 days.  Dispense: 90 tablet; Refill: 0 - gabapentin (NEURONTIN) 600 MG tablet; Take 1 tablet (600 mg total) by mouth 3 (three) times daily.  Dispense: 90 tablet; Refill: 2 - ibuprofen (ADVIL) 600 MG tablet; Take 1 tablet (600 mg total) by mouth every 8 (eight) hours as needed.  Dispense: 30 tablet; Refill: 5

## 2023-07-08 NOTE — Addendum Note (Signed)
Addended by: Renelda Loma on: 07/08/2023 04:46 PM   Modules accepted: Orders

## 2023-07-08 NOTE — Patient Instructions (Signed)
1. Sickle-cell disease with pain (HCC) (Primary)  - Sickle Cell Panel - Reticulocytes - Oxycodone HCl 10 MG TABS; Take 1 tablet (10 mg total) by mouth every 4 (four) hours as needed for up to 15 days.  Dispense: 90 tablet; Refill: 0 - gabapentin (NEURONTIN) 600 MG tablet; Take 1 tablet (600 mg total) by mouth 3 (three) times daily.  Dispense: 90 tablet; Refill: 2 - folic acid (FOLVITE) 1 MG tablet; Take 1 tablet (1 mg total) by mouth daily.  Dispense: 90 tablet; Refill: 3 - ibuprofen (ADVIL) 600 MG tablet; Take 1 tablet (600 mg total) by mouth every 8 (eight) hours as needed.  Dispense: 30 tablet; Refill: 5  2. Chronic pain syndrome  - Sickle Cell Panel - Reticulocytes - ToxAssure Flex 15, Ur - Oxycodone HCl 10 MG TABS; Take 1 tablet (10 mg total) by mouth every 4 (four) hours as needed for up to 15 days.  Dispense: 90 tablet; Refill: 0 - gabapentin (NEURONTIN) 600 MG tablet; Take 1 tablet (600 mg total) by mouth 3 (three) times daily.  Dispense: 90 tablet; Refill: 2 - ibuprofen (ADVIL) 600 MG tablet; Take 1 tablet (600 mg total) by mouth every 8 (eight) hours as needed.  Dispense: 30 tablet; Refill: 5  It is important that you exercise regularly at least 30 minutes 5 times a week as tolerated    [facility] for [facility] we consider it a privelige to serve you.

## 2023-07-08 NOTE — Progress Notes (Signed)
New Patient Office Visit  Subjective:  Patient ID: [name], female    DOB: July 03, 1970  Age: 53 y.o. MRN: [id number]  CC:  Chief Complaint  Patient presents with   Sickle Cell Anemia    Follow up    HPI [name] is a 53 y.o. female  has a past medical history of Anemia (03/30/2012), CAP (community acquired pneumonia), Cholelithiasis (03/30/2012), Chronic wound of extremity (04/01/2012), Elevated LFTs, Leg ulcer (HCC) (10/27/2012), Multiple open wounds of lower extremity, Peripheral arterial disease (HCC), Sickle cell disease (HCC), and Sinus bradycardia by electrocardiogram (04/03/2012).  Patient presents for follow-up for her chronic medical conditions and to establish care for her chronic medical conditions.  Previous PCP Julianne Handler NP  Chronic wound .she has chronic longstanding bilateral ankle wounds, she is followed by the specialist at the wound care clinic.  She has been doing wound care at home by herself.  Had ABIs done by vascular, they stated that the patient has normal flow throughout lower extremities and has adequate perfusion to heal and they had recommended for her to continue local wound care at the wound care center.had an x-ray done today states that they are considering doing a surgery under general anesthesia since it has been difficult completing wound care at the office.  Her pain today is mainly in her legs which she describes as a burning pain.  Takes oxycodone ,gabapentin and ibuprofen.  States that the pain he now makes is a 10/10 today.  She denies fever, chest pain, shortness of breath nausea vomiting diarrhea.  Oxycodone was last taking today  Sickle cell disease.  Currently on folic acid 1 mg daily, she is not on a disease modifying agents, has not seen hematologist in years.  She is interested in referral to hematologist at [facility] health.       Past Medical History:  Diagnosis Date   Anemia 03/30/2012   Hx of sickle cell disease   CAP  (community acquired pneumonia)    2014   Cholelithiasis 03/30/2012   Chronic wound of extremity 04/01/2012   Elevated LFTs    Leg ulcer (HCC) 10/27/2012   Chronic under care of wound clinic   Multiple open wounds of lower extremity    chronic wounds B/LLE   Peripheral arterial disease (HCC)    Sickle cell disease (HCC)    Sinus bradycardia by electrocardiogram 04/03/2012    Past Surgical History:  Procedure Laterality Date   ALLOGRAFT APPLICATION Right 02/14/2015   Procedure: SURGICAL PREP FOR GRAFTING RIGHT LOWER EXTREMITY AND APPLICATION OF Marcellus Scott;  Surgeon: Glenna Fellows, MD;  Location: [facility];  Service: Plastics;  Laterality: Right;   APPLICATION OF A-CELL OF EXTREMITY Right 10/09/2015   Procedure: APPLICATION OF Marcellus Scott;  Surgeon: Glenna Fellows, MD;  Location: [facility];  Service: Plastics;  Laterality: Right;   BREAST SURGERY     left breast cyst aspiration   CESAREAN SECTION     CESAREAN SECTION N/A 01/06/2014   Procedure: CESAREAN SECTION;  Surgeon: Allie Bossier, MD;  Location: [facility];  Service: Obstetrics;  Laterality: N/A;   CESAREAN SECTION N/A 05/04/2017   Procedure: CESAREAN SECTION;  Surgeon: Adam Phenix, MD;  Location: [facility];  Service: Obstetrics;  Laterality: N/A;   I & D EXTREMITY Right 10/09/2015   Procedure: SURGICAL PREPARATION FOR GRAFTING RIGHT ANKLE AND APPLICATION THERASKIN;  Surgeon: Glenna Fellows, MD;  Location: [facility];  Service: Plastics;  Laterality: Right;  SKIN FULL THICKNESS GRAFT Bilateral 06/17/2016   Procedure: SURGICAL PREP FOR GRAFTING, BILATERAL LOWER EXTREMITIES AND APPLICATION OF Marcellus Scott;  Surgeon: Glenna Fellows, MD;  Location: [facility];  Service: Plastics;  Laterality: Bilateral;   TONSILLECTOMY      Family History  Problem Relation Age of Onset   Sickle cell anemia Sister    Diabetes Mother    Asthma Mother    Sickle cell trait Mother    Sickle cell trait  Father    Hypertension Father     Social History   Socioeconomic History   Marital status: Married    Spouse name: Arvilla Meres   Number of children: 3   Years of education: Not on file   Highest education level: Not on file  Occupational History   Occupation: Employed in home care.  Tobacco Use   Smoking status: Never    Passive exposure: Never   Smokeless tobacco: Never  Vaping Use   Vaping status: Never Used  Substance and Sexual Activity   Alcohol use: No   Drug use: No   Sexual activity: Yes    Birth control/protection: None  Other Topics Concern   Not on file  Social History Narrative   Lives with husband.   Social Drivers of Corporate investment banker Strain: Not on file  Food Insecurity: Low Risk  (05/24/2022)   [facility]   Hunger Vital Sign    Worried About Running Out of Food in the Last Year: Never true    Within the past 12 months, the food you bought just didn't last and you didn't have money to get more: Not on file  Transportation Needs: Not on file (05/24/2022)  Physical Activity: Not on file  Stress: Not on file  Social Connections: Not on file  Intimate Partner Violence: Low Risk  (05/24/2022)   [facility] prior to 07/20/2022., [facility] visits prior to 07/20/2022.   Safety    How often does anyone, including family and friends, physically hurt you?: Never    How often does anyone, including family and friends, insult or talk down to you?: Never    How often does anyone, including family and friends, threaten you with harm?: Never    How often does anyone, including family and friends, scream or curse at you?: Never    ROS Review of Systems  Objective:   Today's Vitals: BP (!) 111/45   Pulse 78   Temp (!) 97.4 F (36.3 C)   Wt 121 lb (54.9 kg)   SpO2 94%   BMI 18.95 kg/m   Physical Exam Vitals and nursing note reviewed.   Constitutional:      General: She is not in acute distress.    Appearance: Normal appearance. She is not ill-appearing, toxic-appearing or diaphoretic.  HENT:     Mouth/Throat:     Mouth: Mucous membranes are moist.     Pharynx: Oropharynx is clear. No oropharyngeal exudate or posterior oropharyngeal erythema.  Eyes:     General: Scleral icterus present.        Right eye: No discharge.        Left eye: No discharge.     Extraocular Movements: Extraocular movements intact.     Conjunctiva/sclera: Conjunctivae normal.  Cardiovascular:     Rate and Rhythm: Normal rate and regular rhythm.     Pulses: Normal pulses.     Heart sounds: Normal heart  sounds. No murmur heard.    No friction rub. No gallop.  Pulmonary:     Effort: Pulmonary effort is normal. No respiratory distress.     Breath sounds: Normal breath sounds. No stridor. No wheezing, rhonchi or rales.  Chest:     Chest wall: No tenderness.  Abdominal:     General: There is no distension.     Palpations: Abdomen is soft.     Tenderness: There is no abdominal tenderness. There is no right CVA tenderness, left CVA tenderness or guarding.  Musculoskeletal:        General: Tenderness present. No swelling or deformity.     Right lower leg: No edema.     Left lower leg: No edema.  Skin:    General: Skin is warm.     Comments: Wound on bilateral lower extremities.  Dressing in place unable to assess wound.  Neurological:     Mental Status: She is alert and oriented to person, place, and time.  Psychiatric:        Mood and Affect: Mood normal.        Behavior: Behavior normal.        Thought Content: Thought content normal.        Judgment: Judgment normal.     Assessment & Plan:   Problem List Items Addressed This Visit       Other   Chronic pain syndrome (Chronic)   Relevant Medications   Oxycodone HCl 10 MG TABS (Start on 07/09/2023)   gabapentin (NEURONTIN) 600 MG tablet   ibuprofen (ADVIL) 600 MG tablet   Other  Relevant Orders   Sickle Cell Panel   Reticulocytes   ToxAssure Flex 15, Ur   Hb-SS disease without crisis (HCC)   Relevant Medications   folic acid (FOLVITE) 1 MG tablet   Sickle-cell disease with pain (HCC) - Primary   Sickle cell disease - Continue  Folic acid 1 mg daily to prevent aplastic bone marrow crises.  The patient was reminded of the need to seek medical attention of any symptoms of bleeding, anemia, or infection.    Immunization status -due for influenza vaccine, shingles vaccine, pneumonia vaccine, need for all vaccines discussed.  Patient declined vaccines today  Acute and chronic painful episodes -  .  We discussed that pt is to receive her Schedule II prescriptions only from [place]. Pt is also aware that the prescription history is available to [place] online through the [facility] CSRS. We reminded the patient that all patients receiving Schedule II narcotics must be seen for follow within the 3 months in the office.  We reviewed the terms of our pain agreement, including the need to keep medicines in a safe locked location away from children or pets, and the need to report excess sedation or constipation, measures to avoid constipation, and policies related to early refills and stolen prescriptions. According to the [place] Chronic Pain Initiative program, we have reviewed details related to analgesia, adverse effects, aberrant behaviors. Reviewed [place] Substance Reporting system prior to prescribing opiate medication, no inconsistencies noted.   Increase gabapentin to 600 mg 3 times daily, continue oxycodone 10 mg every 4 hours as needed, ibuprofen 600 mg every 8 hours as needed.  Toradol 30 mg injection one-time dose given in the office today Iron overload from chronic transfusion.  Will check ferritin levels.  Has dietary from ordered but is not taking the medication  1. Sickle-cell disease with pain (HCC) (Primary)  - Sickle Cell Panel -  Reticulocytes - Oxycodone HCl 10 MG TABS; Take 1 tablet  (10 mg total) by mouth every 4 (four) hours as needed for up to 15 days.  Dispense: 90 tablet; Refill: 0 - gabapentin (NEURONTIN) 600 MG tablet; Take 1 tablet (600 mg total) by mouth 3 (three) times daily.  Dispense: 90 tablet; Refill: 2 - folic acid (FOLVITE) 1 MG tablet; Take 1 tablet (1 mg total) by mouth daily.  Dispense: 90 tablet; Refill: 3 - ibuprofen (ADVIL) 600 MG tablet; Take 1 tablet (600 mg total) by mouth every 8 (eight) hours as needed.  Dispense: 30 tablet; Refill: 5 - Ambulatory referral to Hematology / Oncology  2. Chronic pain syndrome  - Sickle Cell Panel - Reticulocytes - ToxAssure Flex 15, Ur - Oxycodone HCl 10 MG TABS; Take 1 tablet (10 mg total) by mouth every 4 (four) hours as needed for up to 15 days.  Dispense: 90 tablet; Refill: 0 - gabapentin (NEURONTIN) 600 MG tablet; Take 1 tablet (600 mg total) by mouth 3 (three) times daily.  Dispense: 90 tablet; Refill: 2 - ibuprofen (ADVIL) 600 MG tablet; Take 1 tablet (600 mg total) by mouth every 8 (eight) hours as needed.  Dispense: 30 tablet; Refill: 5        Relevant Medications   Oxycodone HCl 10 MG TABS (Start on 07/09/2023)   gabapentin (NEURONTIN) 600 MG tablet   folic acid (FOLVITE) 1 MG tablet   ibuprofen (ADVIL) 600 MG tablet   Other Relevant Orders   Sickle Cell Panel   Reticulocytes   Ambulatory referral to Hematology / Oncology   Bilateral leg ulcer (HCC)   Patient encouraged to maintain close follow-up with a specialist at the wound care center Toradol 30 mg injection one-time dose given in the office today Increase gabapentin to 600 mg 3 times daily, continue oxycodone 10 mg every 4 hours as needed      Other Visit Diagnoses       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   polyethylene glycol powder (GLYCOLAX/MIRALAX) 17 GM/SCOOP powder Take 17 g by mouth 2 (two) times daily as needed. (Patient taking differently: Take 17 g by mouth 2 (two) times daily as needed for mild constipation.)   [DISCONTINUED] folic acid (FOLVITE) 1 MG tablet Take 5 tablets (5 mg total) by mouth daily. (Patient taking differently: Take 1 mg by mouth daily.)   [DISCONTINUED] gabapentin (NEURONTIN) 300 MG capsule TAKE 1 CAPSULE(300 MG) BY MOUTH THREE TIMES DAILY   [DISCONTINUED] ibuprofen (ADVIL) 600 MG tablet TAKE 1 TABLET(600 MG) BY MOUTH EVERY 8 HOURS AS NEEDED   Deferiprone 500 MG TABS Take 500 mg by mouth daily at 12 noon. (Patient not taking: Reported on 04/01/2023)   folic acid (FOLVITE) 1 MG tablet Take 1 tablet (1 mg total) by mouth daily.   ibuprofen (ADVIL) 600 MG tablet Take 1 tablet (600 mg total) by mouth every 8 (eight) hours as needed.   Nutritional Supplements (JUVEN) POWD Take 1 each by mouth 3 (three) times daily between meals. (Patient not taking: Reported on 04/01/2023)   [START ON 07/09/2023] Oxycodone HCl 10  MG TABS Take 1 tablet (10 mg total) by mouth every 4 (four) hours as needed for up to 15 days.   Vitamin D, Ergocalciferol, (DRISDOL) 1.25 MG (50000 UNIT) CAPS capsule Take 1 capsule (50,000 Units total) by mouth every 7 (seven) days. (Patient not taking: Reported on 04/01/2023)   [DISCONTINUED] OXBRYTA 500 MG TABS tablet TAKE 2 TABLETS BY MOUTH EVERY DAY (Patient not taking: Reported on 04/01/2023)   No facility-administered encounter medications on file as of 07/08/2023.    Follow-up: Return in about 3 months (around 10/05/2023), or sickle cell disease.   Donell Beers, FNP

## 2023-07-08 NOTE — Assessment & Plan Note (Signed)
Patient encouraged to maintain close follow-up with a specialist at the wound care center Toradol 30 mg injection one-time dose given in the office today Increase gabapentin to 600 mg 3 times daily, continue oxycodone 10 mg every 4 hours as needed

## 2023-07-11 ENCOUNTER — Other Ambulatory Visit: Payer: Self-pay | Admitting: Nurse Practitioner

## 2023-07-11 ENCOUNTER — Encounter: Payer: Self-pay | Admitting: Internal Medicine

## 2023-07-11 ENCOUNTER — Non-Acute Institutional Stay (HOSPITAL_COMMUNITY)
Admission: RE | Admit: 2023-07-11 | Discharge: 2023-07-11 | Disposition: A | Discharge status: Home / Self Care | Payer: Medicaid Other | Source: Ambulatory Visit | Attending: Nurse Practitioner | Admitting: Nurse Practitioner

## 2023-07-11 VITALS — BP 117/59 | HR 66 | Temp 98.0°F | Resp 16

## 2023-07-11 DIAGNOSIS — D571 Sickle-cell disease without crisis: Secondary | ICD-10-CM | POA: Insufficient documentation

## 2023-07-11 LAB — TOXASSURE FLEX 15, UR: Creatinine: 63 mg/dL

## 2023-07-11 LAB — CMP14+CBC/D/PLT+FER+RETIC+V...
ALT: 22 [IU]/L (ref 0–32)
AST: 50 [IU]/L — ABNORMAL HIGH (ref 0–40)
Albumin: 4.2 g/dL (ref 3.8–4.9)
Alkaline Phosphatase: 82 [IU]/L (ref 44–121)
BUN/Creatinine Ratio: 20 (ref 9–23)
BUN: 12 mg/dL (ref 6–24)
Basophils Absolute: 0.1 10*3/uL (ref 0.0–0.2)
Basos: 1 %
Bilirubin Total: 3.4 mg/dL — ABNORMAL HIGH (ref 0.0–1.2)
CO2: 18 mmol/L — ABNORMAL LOW (ref 20–29)
Calcium: 9.5 mg/dL (ref 8.7–10.2)
Chloride: 107 mmol/L — ABNORMAL HIGH (ref 96–106)
Creatinine, Ser: 0.61 mg/dL (ref 0.57–1.00)
EOS (ABSOLUTE): 0.5 10*3/uL — ABNORMAL HIGH (ref 0.0–0.4)
Eos: 3 %
Ferritin: 1217 ng/mL — ABNORMAL HIGH (ref 15–150)
Globulin, Total: 3.3 g/dL (ref 1.5–4.5)
Glucose: 82 mg/dL (ref 70–99)
Hematocrit: 16.6 % — CL (ref 34.0–46.6)
Hemoglobin: 5.6 g/dL — CL (ref 11.1–15.9)
Immature Grans (Abs): 0.1 10*3/uL (ref 0.0–0.1)
Immature Granulocytes: 1 %
Lymphocytes Absolute: 2.1 10*3/uL (ref 0.7–3.1)
Lymphs: 14 %
MCH: 31.6 pg (ref 26.6–33.0)
MCHC: 33.7 g/dL (ref 31.5–35.7)
MCV: 94 fL (ref 79–97)
Monocytes Absolute: 1.6 10*3/uL — ABNORMAL HIGH (ref 0.1–0.9)
Monocytes: 11 %
NRBC: 88 % — ABNORMAL HIGH (ref 0–0)
Neutrophils Absolute: 10.3 10*3/uL — ABNORMAL HIGH (ref 1.4–7.0)
Neutrophils: 70 %
Platelets: 415 10*3/uL (ref 150–450)
Potassium: 4.2 mmol/L (ref 3.5–5.2)
RBC: 1.77 x10E6/uL — CL (ref 3.77–5.28)
RDW: 27.7 % — ABNORMAL HIGH (ref 11.7–15.4)
Retic Ct Pct: 24.9 % — ABNORMAL HIGH (ref 0.6–2.6)
Sodium: 140 mmol/L (ref 134–144)
Total Protein: 7.5 g/dL (ref 6.0–8.5)
Vit D, 25-Hydroxy: 28 ng/mL — ABNORMAL LOW (ref 30.0–100.0)
WBC: 14.6 10*3/uL — ABNORMAL HIGH (ref 3.4–10.8)
eGFR: 108 mL/min/{1.73_m2} (ref >59)

## 2023-07-11 LAB — OXYCODONE CLASS, MS, UR RFX
Noroxycodone: 9702 ng/mg{creat}
Noroxymorphone: 2638 ng/mg{creat}
Oxycodone Class Confirmation: POSITIVE
Oxycodone: 3375 ng/mg{creat}
Oxymorphone: 6308 ng/mg{creat}

## 2023-07-11 LAB — OPIATE CLASS, MS, UR RFX
Codeine: NOT DETECTED ng/mg{creat}
Dihydrocodeine: NOT DETECTED ng/mg{creat}
Hydrocodone: NOT DETECTED ng/mg{creat}
Hydromorphone: NOT DETECTED ng/mg{creat}
Morphine: NOT DETECTED ng/mg{creat}
Norcodeine: NOT DETECTED ng/mg{creat}
Norhydrocodone: NOT DETECTED ng/mg{creat}
Normorphine: NOT DETECTED ng/mg{creat}
Opiate Class Confirmation: NEGATIVE

## 2023-07-11 LAB — PREPARE RBC (CROSSMATCH)

## 2023-07-11 MED ORDER — SODIUM CHLORIDE 0.9% IV SOLUTION: Freq: Once | INTRAVENOUS | Status: AC

## 2023-07-11 MED ORDER — ACETAMINOPHEN 325 MG PO TABS
650.0000 mg | ORAL_TABLET | Freq: Once | ORAL | Status: AC
  Administered 2023-07-11: 650 mg via ORAL
  Filled 2023-07-11: qty 2

## 2023-07-11 MED ORDER — DIPHENHYDRAMINE HCL 50 MG/ML IJ SOLN
25.0000 mg | Freq: Once | INTRAMUSCULAR | Status: AC
  Administered 2023-07-11: 25 mg via INTRAVENOUS
  Filled 2023-07-11: qty 1

## 2023-07-11 NOTE — Progress Notes (Signed)
PATIENT CARE CENTER NOTE   Diagnosis: Hb-SS disease without crisis   Provider: Edwin Dada, FNP    Procedure: Blood transfusion   Note: Patient received 2 units PRBC's via PIV. Patient pre-medicated with 25 mg IV Benadryl and 650 mg PO Tylenol per order. Patient tolerated transfusion well with no adverse reaction. Vital signs stable. Discharge instructions given. Patient to come back for post-transfusion labs in primary care next week. Patient will schedule lab appointment at the front desk. Patient alert, oriented and ambulatory to wheelchair at discharge.

## 2023-07-14 ENCOUNTER — Other Ambulatory Visit: Payer: Medicaid Other

## 2023-07-14 DIAGNOSIS — D571 Sickle-cell disease without crisis: Secondary | ICD-10-CM

## 2023-07-14 LAB — TYPE AND SCREEN
ABO/RH(D): B POS
Antibody Screen: NEGATIVE
Unit division: 0
Unit division: 0

## 2023-07-14 LAB — BPAM RBC
Blood Product Expiration Date: 202503202359
Blood Product Expiration Date: 202503262359
ISSUE DATE / TIME: 202502211111
ISSUE DATE / TIME: 202502211111
Unit Type and Rh: 5100
Unit Type and Rh: 5100

## 2023-07-15 LAB — CBC WITH DIFFERENTIAL/PLATELET
Basophils Absolute: 0.1 10*3/uL (ref 0.0–0.2)
Basos: 1 %
EOS (ABSOLUTE): 0.6 10*3/uL — ABNORMAL HIGH (ref 0.0–0.4)
Eos: 7 %
Hematocrit: 22.4 % — ABNORMAL LOW (ref 34.0–46.6)
Hemoglobin: 7.5 g/dL — ABNORMAL LOW (ref 11.1–15.9)
Immature Grans (Abs): 0 10*3/uL (ref 0.0–0.1)
Immature Granulocytes: 1 %
Lymphocytes Absolute: 0.9 10*3/uL (ref 0.7–3.1)
Lymphs: 10 %
MCH: 29.5 pg (ref 26.6–33.0)
MCHC: 33.5 g/dL (ref 31.5–35.7)
MCV: 88 fL (ref 79–97)
Monocytes Absolute: 1.1 10*3/uL — ABNORMAL HIGH (ref 0.1–0.9)
Monocytes: 13 %
NRBC: 35 % — ABNORMAL HIGH (ref 0–0)
Neutrophils Absolute: 6 10*3/uL (ref 1.4–7.0)
Neutrophils: 68 %
Platelets: 420 10*3/uL (ref 150–450)
RBC: 2.54 x10E6/uL — CL (ref 3.77–5.28)
RDW: 19.9 % — ABNORMAL HIGH (ref 11.7–15.4)
WBC: 8.7 10*3/uL (ref 3.4–10.8)

## 2023-07-21 ENCOUNTER — Encounter (HOSPITAL_BASED_OUTPATIENT_CLINIC_OR_DEPARTMENT_OTHER): Payer: Medicaid Other | Attending: General Surgery | Admitting: General Surgery

## 2023-07-21 DIAGNOSIS — D571 Sickle-cell disease without crisis: Secondary | ICD-10-CM | POA: Insufficient documentation

## 2023-07-21 DIAGNOSIS — L97818 Non-pressure chronic ulcer of other part of right lower leg with other specified severity: Secondary | ICD-10-CM | POA: Insufficient documentation

## 2023-07-21 DIAGNOSIS — I70203 Unspecified atherosclerosis of native arteries of extremities, bilateral legs: Secondary | ICD-10-CM | POA: Diagnosis not present

## 2023-07-21 DIAGNOSIS — L97828 Non-pressure chronic ulcer of other part of left lower leg with other specified severity: Secondary | ICD-10-CM | POA: Insufficient documentation

## 2023-07-22 ENCOUNTER — Other Ambulatory Visit: Payer: Self-pay | Admitting: Nurse Practitioner

## 2023-07-22 DIAGNOSIS — G894 Chronic pain syndrome: Secondary | ICD-10-CM

## 2023-07-22 DIAGNOSIS — D5700 Hb-SS disease with crisis, unspecified: Secondary | ICD-10-CM

## 2023-07-22 MED ORDER — OXYCODONE HCL 10 MG PO TABS
10.0000 mg | ORAL_TABLET | ORAL | 0 refills | Status: DC | PRN
Start: 2023-07-24 — End: 2023-08-13

## 2023-07-22 NOTE — Progress Notes (Signed)
 Reviewed PDMP substance reporting system prior to prescribing opiate medications. No inconsistencies noted.   1. Sickle-cell disease with pain (HCC)  - Oxycodone HCl 10 MG TABS; Take 1 tablet (10 mg total) by mouth every 4 (four) hours as needed for up to 15 days.  Dispense: 90 tablet; Refill: 0  2. Chronic pain syndrome  - Oxycodone HCl 10 MG TABS; Take 1 tablet (10 mg total) by mouth every 4 (four) hours as needed for up to 15 days.  Dispense: 90 tablet; Refill: 0

## 2023-07-22 NOTE — Telephone Encounter (Signed)
 Copied from CRM [id number]. Topic: Clinical - Medication Refill >> Jul 22, 2023  2:56 PM Turkey B wrote: Most Recent Primary Care Visit:  Provider: [facility]  Department: [facility]  Visit Type: LAB  Date: 07/14/2023  Medication: Oxycodone HCl 10 MG TABS  Has the patient contacted their pharmacy? no Because of type of med, pt called in  Is this the correct pharmacy for this prescription? yes This is the patient's preferred pharmacy:  [facility] #[id number] [facility], [place] - [address] AT [facility] [address] [place] [address] Phone: [phone] Fax: [phone]    Has the prescription been filled recently?no  Is the patient out of the medication? yes  Has the patient been seen for an appointment in the last year OR does the patient have an upcoming appointment? yes  Can we respond through MyChart? yes  Agent: Please be advised that Rx refills may take up to 3 business days. We ask that you follow-up with your pharmacy.

## 2023-07-22 NOTE — Telephone Encounter (Signed)
 Last Fill: 07/09/23 90 tabs/0 RF  Last OV: 07/08/23 Next OV: 10/06/23  Routing to provider for review/authorization.

## 2023-07-28 ENCOUNTER — Other Ambulatory Visit: Payer: Self-pay | Admitting: Nurse Practitioner

## 2023-07-28 DIAGNOSIS — G894 Chronic pain syndrome: Secondary | ICD-10-CM

## 2023-07-28 DIAGNOSIS — D5700 Hb-SS disease with crisis, unspecified: Secondary | ICD-10-CM

## 2023-07-28 MED ORDER — GABAPENTIN 600 MG PO TABS: 600.0000 mg | ORAL_TABLET | Freq: Three times a day (TID) | ORAL | 2 refills | Status: DC

## 2023-08-04 ENCOUNTER — Encounter (HOSPITAL_BASED_OUTPATIENT_CLINIC_OR_DEPARTMENT_OTHER): Admitting: Internal Medicine

## 2023-08-04 DIAGNOSIS — L97818 Non-pressure chronic ulcer of other part of right lower leg with other specified severity: Secondary | ICD-10-CM | POA: Diagnosis not present

## 2023-08-12 ENCOUNTER — Telehealth: Payer: Self-pay | Admitting: Nurse Practitioner

## 2023-08-12 NOTE — Telephone Encounter (Signed)
 Copied from CRM [id number]. Topic: Clinical - Medication Refill >> Aug 12, 2023 10:56 AM Fuller Mandril wrote: Most Recent Primary Care Visit:  Provider: [facility]  Department: [facility]  Visit Type: LAB  Date: 07/14/2023  Medication: Oxycodone HCl 10 MG TABS  Has the patient contacted their pharmacy? No (Agent: If no, request that the patient contact the pharmacy for the refill. If patient does not wish to contact the pharmacy document the reason why and proceed with request.) (Agent: If yes, when and what did the pharmacy advise?) Always call provider - controlled substance   Is this the correct pharmacy for this prescription? Yes If no, delete pharmacy and type the correct one.  This is the patient's preferred pharmacy:  [facility] #[id number] [facility], [place] - [address] AT [facility] [address] [place] [address] Phone: [phone] Fax: [phone]  [place] [facility] [address] [place] [address] Phone: [phone] Fax: [phone]  [facility] [address], [place] - [address] [place] [address] Phone: [phone] Fax: [phone]   Has the prescription been filled recently? N/A - looks like it was ended on 07/24/23  Is the patient out of the medication? Yes  Has the patient been seen for an appointment in the last year OR does the patient have an upcoming appointment? Yes   Can we respond through MyChart? No  Agent: Please be advised that Rx refills may take up to 3 business days. We ask that you follow-up with your pharmacy.

## 2023-08-13 ENCOUNTER — Other Ambulatory Visit: Payer: Self-pay | Admitting: Nurse Practitioner

## 2023-08-13 DIAGNOSIS — G894 Chronic pain syndrome: Secondary | ICD-10-CM

## 2023-08-13 DIAGNOSIS — D5700 Hb-SS disease with crisis, unspecified: Secondary | ICD-10-CM

## 2023-08-13 MED ORDER — OXYCODONE HCL 10 MG PO TABS: 10.0000 mg | ORAL_TABLET | ORAL | 0 refills | Status: DC | PRN

## 2023-08-13 NOTE — Progress Notes (Unsigned)
 Reviewed PDMP substance reporting system prior to prescribing opiate medications. No inconsistencies noted.   1. Sickle-cell disease with pain (HCC)  - Oxycodone HCl 10 MG TABS; Take 1 tablet (10 mg total) by mouth every 4 (four) hours as needed for up to 15 days.  Dispense: 90 tablet; Refill: 0  2. Chronic pain syndrome  - Oxycodone HCl 10 MG TABS; Take 1 tablet (10 mg total) by mouth every 4 (four) hours as needed for up to 15 days.  Dispense: 90 tablet; Refill: 0

## 2023-08-18 ENCOUNTER — Encounter (HOSPITAL_BASED_OUTPATIENT_CLINIC_OR_DEPARTMENT_OTHER): Admitting: General Surgery

## 2023-08-29 ENCOUNTER — Other Ambulatory Visit: Payer: Self-pay | Admitting: Nurse Practitioner

## 2023-08-29 DIAGNOSIS — G894 Chronic pain syndrome: Secondary | ICD-10-CM

## 2023-08-29 DIAGNOSIS — D5700 Hb-SS disease with crisis, unspecified: Secondary | ICD-10-CM

## 2023-08-29 MED ORDER — OXYCODONE HCL 10 MG PO TABS
10.0000 mg | ORAL_TABLET | ORAL | 0 refills | Status: DC | PRN
Start: 2023-08-29 — End: 2023-09-17

## 2023-08-29 NOTE — Telephone Encounter (Signed)
 Please advise in Fola's absence. Thank you [facility]

## 2023-08-29 NOTE — Telephone Encounter (Signed)
 Copied from CRM [id number]. Topic: Clinical - Medication Refill >> Aug 29, 2023 11:29 AM Tiffany B wrote: Most Recent Primary Care Visit:  Provider: [facility]  Department: [facility]  Visit Type: LAB  Date: 07/14/2023  Medication: Oxycodone HCl 10 MG TABS  Has the patient contacted their pharmacy? Yes  (Agent: If yes, when and what did the pharmacy advise?)  Is this the correct pharmacy for this prescription? Yes  This is the patient's preferred pharmacy:  [facility] #[id number] [facility], [place] - [address] AT [facility] [address] [place] [address] Phone: [phone] Fax: [phone]     Has the prescription been filled recently? Yes  Is the patient out of the medication? Yes  Has the patient been seen for an appointment in the last year OR does the patient have an upcoming appointment? Yes  Can we respond through MyChart? No  Agent: Please be advised that Rx refills may take up to 3 business days. We ask that you follow-up with your pharmacy.

## 2023-09-01 ENCOUNTER — Encounter (HOSPITAL_BASED_OUTPATIENT_CLINIC_OR_DEPARTMENT_OTHER): Attending: General Surgery | Admitting: General Surgery

## 2023-09-01 DIAGNOSIS — L97828 Non-pressure chronic ulcer of other part of left lower leg with other specified severity: Secondary | ICD-10-CM | POA: Diagnosis not present

## 2023-09-01 DIAGNOSIS — D571 Sickle-cell disease without crisis: Secondary | ICD-10-CM | POA: Insufficient documentation

## 2023-09-01 DIAGNOSIS — I70203 Unspecified atherosclerosis of native arteries of extremities, bilateral legs: Secondary | ICD-10-CM | POA: Diagnosis not present

## 2023-09-01 DIAGNOSIS — L97818 Non-pressure chronic ulcer of other part of right lower leg with other specified severity: Secondary | ICD-10-CM | POA: Insufficient documentation

## 2023-09-16 ENCOUNTER — Telehealth: Payer: Self-pay

## 2023-09-16 NOTE — Telephone Encounter (Signed)
 Patient wants to know if she can get a refill for oxycodone         [facility] STORE #[id number] - [place], [place] - [address] AT [facility] [address]    [address] [place] [address]    Phone: [phone] Fax: [phone]    Hours: Not open 24 hours

## 2023-09-17 ENCOUNTER — Telehealth: Payer: Self-pay

## 2023-09-17 ENCOUNTER — Other Ambulatory Visit: Payer: Self-pay | Admitting: Nurse Practitioner

## 2023-09-17 DIAGNOSIS — G894 Chronic pain syndrome: Secondary | ICD-10-CM

## 2023-09-17 DIAGNOSIS — D5700 Hb-SS disease with crisis, unspecified: Secondary | ICD-10-CM

## 2023-09-17 MED ORDER — OXYCODONE HCL 10 MG PO TABS: 10.0000 mg | ORAL_TABLET | ORAL | 0 refills | Status: DC | PRN

## 2023-09-17 NOTE — Telephone Encounter (Signed)
 Needs refill of oxy. Thanks [employer]

## 2023-09-17 NOTE — Progress Notes (Signed)
 Reviewed PDMP substance reporting system prior to prescribing opiate medications. No inconsistencies noted.   1. Sickle-cell disease with pain (HCC)  - Oxycodone  HCl 10 MG TABS; Take 1 tablet (10 mg total) by mouth every 4 (four) hours as needed.  Dispense: 90 tablet; Refill: 0  2. Chronic pain syndrome  - Oxycodone  HCl 10 MG TABS; Take 1 tablet (10 mg total) by mouth every 4 (four) hours as needed.  Dispense: 90 tablet; Refill: 0

## 2023-09-22 ENCOUNTER — Encounter (HOSPITAL_BASED_OUTPATIENT_CLINIC_OR_DEPARTMENT_OTHER): Attending: General Surgery | Admitting: General Surgery

## 2023-09-22 DIAGNOSIS — L97828 Non-pressure chronic ulcer of other part of left lower leg with other specified severity: Secondary | ICD-10-CM | POA: Insufficient documentation

## 2023-09-22 DIAGNOSIS — D571 Sickle-cell disease without crisis: Secondary | ICD-10-CM | POA: Insufficient documentation

## 2023-09-22 DIAGNOSIS — L97818 Non-pressure chronic ulcer of other part of right lower leg with other specified severity: Secondary | ICD-10-CM | POA: Insufficient documentation

## 2023-10-03 ENCOUNTER — Encounter (HOSPITAL_BASED_OUTPATIENT_CLINIC_OR_DEPARTMENT_OTHER): Admitting: Internal Medicine

## 2023-10-03 ENCOUNTER — Other Ambulatory Visit: Payer: Self-pay | Admitting: Nurse Practitioner

## 2023-10-03 DIAGNOSIS — D571 Sickle-cell disease without crisis: Secondary | ICD-10-CM | POA: Diagnosis not present

## 2023-10-03 DIAGNOSIS — G894 Chronic pain syndrome: Secondary | ICD-10-CM

## 2023-10-03 DIAGNOSIS — L97828 Non-pressure chronic ulcer of other part of left lower leg with other specified severity: Secondary | ICD-10-CM | POA: Diagnosis not present

## 2023-10-03 DIAGNOSIS — L97818 Non-pressure chronic ulcer of other part of right lower leg with other specified severity: Secondary | ICD-10-CM | POA: Diagnosis present

## 2023-10-03 DIAGNOSIS — D5700 Hb-SS disease with crisis, unspecified: Secondary | ICD-10-CM

## 2023-10-03 MED ORDER — OXYCODONE HCL 10 MG PO TABS: 10.0000 mg | ORAL_TABLET | ORAL | 0 refills | Status: DC | PRN

## 2023-10-03 NOTE — Telephone Encounter (Signed)
 Please advise [facility]

## 2023-10-03 NOTE — Progress Notes (Signed)
 Reviewed PDMP substance reporting system prior to prescribing opiate medications. No inconsistencies noted.   1. Sickle-cell disease with pain (HCC)  - Oxycodone  HCl 10 MG TABS; Take 1 tablet (10 mg total) by mouth every 4 (four) hours as needed.  Dispense: 90 tablet; Refill: 0  2. Chronic pain syndrome  - Oxycodone  HCl 10 MG TABS; Take 1 tablet (10 mg total) by mouth every 4 (four) hours as needed.  Dispense: 90 tablet; Refill: 0

## 2023-10-03 NOTE — Telephone Encounter (Unsigned)
 Copied from CRM [id number]. Topic: Clinical - Medication Refill >> Oct 03, 2023 11:29 AM Felizardo Hotter wrote: Medication: Oxycodone  HCl 10 MG TABS  Has the patient contacted their pharmacy? Yes (Agent: If no, request that the patient contact the pharmacy for the refill. If patient does not wish to contact the pharmacy document the reason why and proceed with request.) (Agent: If yes, when and what did the pharmacy advise?)  This is the patient's preferred pharmacy:  [facility] DR[facility] STORE #[id number] [facility],  - [address] AT [facility] [address] [place] [address] Phone: [phone] Fax: [phone]  Is this the correct pharmacy for this prescription? Yes If no, delete pharmacy and type the correct one.   Has the prescription been filled recently? Yes  Is the patient out of the medication? Yes  Has the patient been seen for an appointment in the last year OR does the patient have an upcoming appointment? Yes  Can we respond through MyChart? Yes  Agent: Please be advised that Rx refills may take up to 3 business days. We ask that you follow-up with your pharmacy.

## 2023-10-06 ENCOUNTER — Ambulatory Visit: Payer: Self-pay | Admitting: Nurse Practitioner

## 2023-10-07 ENCOUNTER — Encounter: Payer: Self-pay | Admitting: Nurse Practitioner

## 2023-10-07 ENCOUNTER — Other Ambulatory Visit (HOSPITAL_COMMUNITY)
Admission: RE | Admit: 2023-10-07 | Discharge: 2023-10-07 | Disposition: A | Discharge status: Home / Self Care | Source: Ambulatory Visit | Attending: Nurse Practitioner | Admitting: Nurse Practitioner

## 2023-10-07 ENCOUNTER — Ambulatory Visit: Payer: Self-pay | Admitting: Nurse Practitioner

## 2023-10-07 VITALS — BP 119/55 | HR 63 | Temp 97.8°F | Wt 123.4 lb

## 2023-10-07 DIAGNOSIS — L97929 Non-pressure chronic ulcer of unspecified part of left lower leg with unspecified severity: Secondary | ICD-10-CM

## 2023-10-07 DIAGNOSIS — R87810 Cervical high risk human papillomavirus (HPV) DNA test positive: Secondary | ICD-10-CM | POA: Insufficient documentation

## 2023-10-07 DIAGNOSIS — Z124 Encounter for screening for malignant neoplasm of cervix: Secondary | ICD-10-CM | POA: Insufficient documentation

## 2023-10-07 DIAGNOSIS — M87051 Idiopathic aseptic necrosis of right femur: Secondary | ICD-10-CM

## 2023-10-07 DIAGNOSIS — R87610 Atypical squamous cells of undetermined significance on cytologic smear of cervix (ASC-US): Secondary | ICD-10-CM | POA: Insufficient documentation

## 2023-10-07 DIAGNOSIS — Z1151 Encounter for screening for human papillomavirus (HPV): Secondary | ICD-10-CM | POA: Insufficient documentation

## 2023-10-07 DIAGNOSIS — M87052 Idiopathic aseptic necrosis of left femur: Secondary | ICD-10-CM

## 2023-10-07 DIAGNOSIS — Z01419 Encounter for gynecological examination (general) (routine) without abnormal findings: Secondary | ICD-10-CM | POA: Diagnosis present

## 2023-10-07 DIAGNOSIS — D5700 Hb-SS disease with crisis, unspecified: Secondary | ICD-10-CM | POA: Diagnosis not present

## 2023-10-07 DIAGNOSIS — Z1231 Encounter for screening mammogram for malignant neoplasm of breast: Secondary | ICD-10-CM | POA: Insufficient documentation

## 2023-10-07 DIAGNOSIS — L97919 Non-pressure chronic ulcer of unspecified part of right lower leg with unspecified severity: Secondary | ICD-10-CM | POA: Diagnosis not present

## 2023-10-07 DIAGNOSIS — E559 Vitamin D deficiency, unspecified: Secondary | ICD-10-CM | POA: Insufficient documentation

## 2023-10-07 NOTE — Assessment & Plan Note (Addendum)
 We discussed the need for good hydration, monitoring of hydration status, avoidance of heat, cold, stress, and infection triggers. We discussed the need to be adherent with taking folic acid  and other home medications. Patient was reminded of the need to seek medical attention immediately if any symptom of bleeding, anemia, or infection occurs.    Need for yearly eye exam discussed referral placed Continue oxycodone  10 mg every 4 hours as needed for moderate pain, gabapentin  800 mg 3 times daily, ibuprofen  600 mg every 8 hours as needed for mild pain Maintain close follow-up with hematology Received blood transfusion at hematology office about 4 weeks ago, rechecking CBC

## 2023-10-07 NOTE — Patient Instructions (Addendum)
  Please consider getting Shingrix and pneumococcal  vaccine at local pharmacy.    Please call [phone]   to schedule your mammogram.  [facility]. [address] [place], [place] [address]. [place] .       It is important that you exercise regularly at least 30 minutes 5 times a week as tolerated  Think about what you will eat, plan ahead. Choose " clean, green, fresh or frozen" over canned, processed or packaged foods which are more sugary, salty and fatty. 70 to 75% of food eaten should be vegetables and fruit. Three meals at set times with snacks allowed between meals, but they must be fruit or vegetables. Aim to eat over a 12 hour period , example 7 am to 7 pm, and STOP after  your last meal of the day. Drink water,generally about 64 ounces per day, no other drink is as healthy. Fruit juice is best enjoyed in a healthy way, by EATING the fruit.  Thanks for choosing [facility] we consider it a privelige to serve you.

## 2023-10-07 NOTE — Assessment & Plan Note (Signed)
 Followed by orthopedics Planning on doing surgery when she is able to get someone to take care of her children as her husband is not always at home due to his work

## 2023-10-07 NOTE — Assessment & Plan Note (Signed)
 Has upcoming surgery Encouraged to maintain close follow-up with the specialist at wound care center Continue oxycodone , gabapentin  and ibuprofen  for pain

## 2023-10-07 NOTE — Assessment & Plan Note (Signed)
Pap smear completed

## 2023-10-07 NOTE — Assessment & Plan Note (Signed)
 Encouraged to take vitamin D  50,000 units once weekly as ordered  Ref Range & Units 1 mo ago  Vitamin D  25-Hydroxy 30.0 - 100.0 ng/mL 21 Low

## 2023-10-07 NOTE — Progress Notes (Signed)
 Established Patient Office Visit  Subjective:  Patient ID: [name], female    DOB: 1970-06-30  Age: 53 y.o. MRN: [id number]  CC:  Chief Complaint  Patient presents with   Sickle Cell Anemia    HPI [name] is a 53 y.o. female  has a past medical history of Anemia (03/30/2012), CAP (community acquired pneumonia), Cholelithiasis (03/30/2012), Chronic wound of extremity (04/01/2012), Elevated LFTs, Leg ulcer (HCC) (10/27/2012), Multiple open wounds of lower extremity, Peripheral arterial disease (HCC), Sickle cell disease (HCC), and Sinus bradycardia by electrocardiogram (04/03/2012).  Patient presents for follow-up for her chronic medical conditions  Sickle cell disease.  Now established with hematologist at [facility].  Takes folic acid  1 mg daily for chronic pain takes gabapentin  600 mg 3 times daily, oxycodone  10 mg every 4 hours as needed.  Oxycodone  was last taken this morning.  Her pain level is currently a 8/10 and it is mainly in her lower extremities.  States that her pain is much better since she started a higher dose of gabapentin  due for yearly eye exam has not had an eye exam in over 2 to 3 years Patient currently denies fever, chills, chest pain, cough, wheezing, shortness of breath, abdominal pain, nausea, vomiting  Bilateral lower extremity wounds.  She goes to the wound care center, they plan on doing surgery in July.  Patient encouraged to get Tdap vaccine and pneumococcal vaccine at the pharmacy, Pap smear completed today.  Mammogram ordered      Past Medical History:  Diagnosis Date   Anemia 03/30/2012   Hx of sickle cell disease   CAP (community acquired pneumonia)    2014   Cholelithiasis 03/30/2012   Chronic wound of extremity 04/01/2012   Elevated LFTs    Leg ulcer (HCC) 10/27/2012   Chronic under care of wound clinic   Multiple open wounds of lower extremity    chronic wounds B/LLE   Peripheral arterial disease (HCC)    Sickle cell  disease (HCC)    Sinus bradycardia by electrocardiogram 04/03/2012    Past Surgical History:  Procedure Laterality Date   ALLOGRAFT APPLICATION Right 02/14/2015   Procedure: SURGICAL PREP FOR GRAFTING RIGHT LOWER EXTREMITY AND APPLICATION OF THERASKIN;  Surgeon: Alger Infield, MD;  Location: [facility];  Service: Plastics;  Laterality: Right;   APPLICATION OF A-CELL OF EXTREMITY Right 10/09/2015   Procedure: APPLICATION OF THERASKIN;  Surgeon: Alger Infield, MD;  Location: [facility];  Service: Plastics;  Laterality: Right;   BREAST SURGERY     left breast cyst aspiration   CESAREAN SECTION     CESAREAN SECTION N/A 01/06/2014   Procedure: CESAREAN SECTION;  Surgeon: Ana Balling, MD;  Location: [facility];  Service: Obstetrics;  Laterality: N/A;   CESAREAN SECTION N/A 05/04/2017   Procedure: CESAREAN SECTION;  Surgeon: Tresia Fruit, MD;  Location: [facility];  Service: Obstetrics;  Laterality: N/A;   I & D EXTREMITY Right 10/09/2015   Procedure: SURGICAL PREPARATION FOR GRAFTING RIGHT ANKLE AND APPLICATION THERASKIN;  Surgeon: Alger Infield, MD;  Location:  SURGERY CENTER;  Service: Plastics;  Laterality: Right;   SKIN FULL THICKNESS GRAFT Bilateral 06/17/2016   Procedure: SURGICAL PREP FOR GRAFTING, BILATERAL LOWER EXTREMITIES AND APPLICATION OF THERASKIN;  Surgeon: Alger Infield, MD;  Location: [facility];  Service: Plastics;  Laterality: Bilateral;   TONSILLECTOMY      Family History  Problem Relation Age of Onset   Sickle cell anemia Sister  Diabetes Mother    Asthma Mother    Sickle cell trait Mother    Sickle cell trait Father    Hypertension Father     Social History   Socioeconomic History   Marital status: Married    Spouse name: Harper Lily   Number of children: 3   Years of education: Not on file   Highest education level: Not on file  Occupational History   Occupation: Employed in home care.  Tobacco Use    Smoking status: Never    Passive exposure: Never   Smokeless tobacco: Never  Vaping Use   Vaping status: Never Used  Substance and Sexual Activity   Alcohol use: No   Drug use: No   Sexual activity: Yes    Birth control/protection: None  Other Topics Concern   Not on file  Social History Narrative   Lives with husband.   Social Drivers of Corporate investment banker Strain: Not on file  Food Insecurity: Low Risk  (05/24/2022)   [facility]   Hunger Vital Sign    Worried About Running Out of Food in the Last Year: Never true    Within the past 12 months, the food you bought just didn't last and you didn't have money to get more: Not on file  Transportation Needs: Not on file (05/24/2022)  Physical Activity: Not on file  Stress: Not on file  Social Connections: Not on file  Intimate Partner Violence: Low Risk  (05/24/2022)   Received from [facility] visits prior to 07/20/2022., [facility] visits prior to 07/20/2022.   Safety    How often does anyone, including family and friends, physically hurt you?: Never    How often does anyone, including family and friends, insult or talk down to you?: Never    How often does anyone, including family and friends, threaten you with harm?: Never    How often does anyone, including family and friends, scream or curse at you?: Never    Outpatient Medications Prior to Visit  Medication Sig Dispense Refill   ASPIRIN  LOW DOSE 81 MG tablet TAKE 1 TABLET(81 MG) BY MOUTH DAILY 30 tablet 12   folic acid  (FOLVITE ) 1 MG tablet Take 1 tablet (1 mg total) by mouth daily. 90 tablet 3   gabapentin  (NEURONTIN ) 600 MG tablet Take 1 tablet (600 mg total) by mouth 3 (three) times daily. 90 tablet 2   ibuprofen  (ADVIL ) 600 MG tablet Take 1 tablet (600 mg total) by mouth every 8 (eight) hours as needed. 30 tablet 5   Oxycodone  HCl 10 MG TABS Take 1 tablet (10 mg total) by mouth every 4 (four)  hours as needed. 90 tablet 0   polyethylene glycol powder (GLYCOLAX /MIRALAX ) 17 GM/SCOOP powder Take 17 g by mouth 2 (two) times daily as needed. (Patient taking differently: Take 17 g by mouth 2 (two) times daily as needed for mild constipation.) 3350 g 1   Deferiprone  500 MG TABS Take 500 mg by mouth daily at 12 noon. (Patient not taking: Reported on 04/01/2023) 90 tablet 2   Nutritional Supplements (JUVEN) POWD Take 1 each by mouth 3 (three) times daily between meals. (Patient not taking: Reported on 04/01/2023) 545 g 6   Vitamin D , Ergocalciferol , (DRISDOL ) 1.25 MG (50000 UNIT) CAPS capsule Take 1 capsule (50,000 Units total) by mouth every 7 (seven) days. (Patient not taking: Reported on 10/07/2023) 5 capsule 2   No facility-administered  medications prior to visit.    No Known Allergies  ROS Review of Systems  Constitutional:  Negative for appetite change, chills, fatigue and fever.  HENT:  Negative for congestion, postnasal drip, rhinorrhea and sneezing.   Respiratory:  Negative for cough, shortness of breath and wheezing.   Cardiovascular:  Negative for chest pain, palpitations and leg swelling.  Gastrointestinal:  Negative for abdominal pain, constipation, nausea and vomiting.  Genitourinary:  Negative for difficulty urinating, dysuria, flank pain and frequency.  Musculoskeletal:  Positive for arthralgias. Negative for joint swelling and myalgias.  Skin:  Positive for wound. Negative for color change, pallor and rash.  Neurological:  Negative for dizziness, facial asymmetry, weakness, numbness and headaches.  Psychiatric/Behavioral:  Negative for behavioral problems, confusion, self-injury and suicidal ideas.       Objective:     Physical Exam Vitals and nursing note reviewed. Exam conducted with a chaperone present.  Constitutional:      General: She is not in acute distress.    Appearance: Normal appearance. She is not ill-appearing, toxic-appearing or diaphoretic.  HENT:      Mouth/Throat:     Mouth: Mucous membranes are moist.     Pharynx: Oropharynx is clear. No oropharyngeal exudate or posterior oropharyngeal erythema.  Eyes:     General: Scleral icterus present.        Right eye: No discharge.        Left eye: No discharge.     Extraocular Movements: Extraocular movements intact.  Cardiovascular:     Rate and Rhythm: Normal rate and regular rhythm.     Pulses: Normal pulses.     Heart sounds: Murmur heard.     No friction rub. No gallop.  Pulmonary:     Effort: Pulmonary effort is normal. No respiratory distress.     Breath sounds: Normal breath sounds. No stridor. No wheezing, rhonchi or rales.  Chest:     Chest wall: No mass, lacerations, deformity, swelling, tenderness or edema.  Breasts:    Tanner Score is 5.     Breasts are symmetrical.     Right: Normal. No swelling, bleeding, inverted nipple, mass, nipple discharge, skin change or tenderness.     Left: Normal. No swelling, bleeding, inverted nipple, mass, nipple discharge, skin change or tenderness.  Abdominal:     General: There is no distension.     Palpations: Abdomen is soft.     Tenderness: There is no abdominal tenderness. There is no right CVA tenderness, left CVA tenderness or guarding.     Hernia: There is no hernia in the left inguinal area or right inguinal area.  Genitourinary:    General: Normal vulva.     Exam position: Lithotomy position.     Pubic Area: No rash or pubic lice.      Tanner stage (genital): 5.     Labia:        Right: No rash, tenderness, lesion or injury.        Left: No rash, tenderness, lesion or injury.      Urethra: No prolapse, urethral pain, urethral swelling or urethral lesion.     Vagina: No signs of injury and foreign body. No vaginal discharge, erythema, tenderness, bleeding, lesions or prolapsed vaginal walls.     Cervix: No cervical motion tenderness, discharge, friability, lesion, erythema, cervical bleeding or eversion.     Uterus: Normal.  Not enlarged, not fixed, not tender and no uterine prolapse.      Adnexa:  Right: No mass, tenderness or fullness.         Left: No mass, tenderness or fullness.    Musculoskeletal:        General: Tenderness present. No swelling.     Comments: Limited range of motion of bilateral hips  Lymphadenopathy:     Upper Body:     Right upper body: No supraclavicular, axillary or pectoral adenopathy.     Left upper body: No supraclavicular, axillary or pectoral adenopathy.     Lower Body: No right inguinal adenopathy. No left inguinal adenopathy.  Skin:    General: Skin is warm and dry.     Coloration: Skin is not jaundiced or pale.     Findings: No bruising, erythema or lesion.     Comments: Bilateral lower extremity wound.  Dressing in place unable to assess site.  Neurological:     Mental Status: She is alert and oriented to person, place, and time.     Motor: No weakness.     Gait: Gait abnormal.  Psychiatric:        Mood and Affect: Mood normal.        Behavior: Behavior normal.        Thought Content: Thought content normal.        Judgment: Judgment normal.     BP (!) 119/55   Pulse 63   Temp 97.8 F (36.6 C)   Wt 123 lb 6.4 oz (56 kg)   SpO2 90%   BMI 19.33 kg/m  Wt Readings from Last 3 Encounters:  10/07/23 123 lb 6.4 oz (56 kg)  07/08/23 121 lb (54.9 kg)  05/30/23 123 lb 3.2 oz (55.9 kg)    Lab Results  Component Value Date   TSH 0.099 (L) 03/20/2017   Lab Results  Component Value Date   WBC 8.7 07/14/2023   HGB 7.5 (L) 07/14/2023   HCT 22.4 (L) 07/14/2023   MCV 88 07/14/2023   PLT 420 07/14/2023   Lab Results  Component Value Date   NA 140 07/08/2023   K 4.2 07/08/2023   CHLORIDE 107 01/20/2014   CO2 18 (L) 07/08/2023   GLUCOSE 82 07/08/2023   BUN 12 07/08/2023   CREATININE 0.61 07/08/2023   BILITOT 3.4 (H) 07/08/2023   ALKPHOS 82 07/08/2023   AST 50 (H) 07/08/2023   ALT 22 07/08/2023   PROT 7.5 07/08/2023   ALBUMIN 4.2 07/08/2023    CALCIUM  9.5 07/08/2023   ANIONGAP 6 10/28/2022   EGFR 108 07/08/2023   Lab Results  Component Value Date   CHOL 143 11/25/2014   Lab Results  Component Value Date   HDL 27 (L) 11/25/2014   Lab Results  Component Value Date   LDLCALC 89 11/25/2014   Lab Results  Component Value Date   TRIG 137 11/25/2014   Lab Results  Component Value Date   CHOLHDL 5.3 11/25/2014   Lab Results  Component Value Date   HGBA1C 4.4 (L) 03/20/2017      Assessment & Plan:   Problem List Items Addressed This Visit       Musculoskeletal and Integument   Avascular necrosis of bone of hip, left (HCC)   Followed by orthopedics Planning on doing surgery when she is able to get someone to take care of her children as her husband is not always at home due to his work      Avascular necrosis of bone of hip, right (HCC)   Followed by orthopedics Planning on doing  surgery when she is able to get someone to take care of her children as her husband is not always at home due to his work        Other   Sickle-cell disease with pain (HCC) - Primary   We discussed the need for good hydration, monitoring of hydration status, avoidance of heat, cold, stress, and infection triggers. We discussed the need to be adherent with taking folic acid  and other home medications. Patient was reminded of the need to seek medical attention immediately if any symptom of bleeding, anemia, or infection occurs.    Need for yearly eye exam discussed referral placed Continue oxycodone  10 mg every 4 hours as needed for moderate pain, gabapentin  800 mg 3 times daily, ibuprofen  600 mg every 8 hours as needed for mild pain Maintain close follow-up with hematology Received blood transfusion at hematology office about 4 weeks ago, rechecking CBC      Relevant Orders   CBC   [id number] 11+Oxyco+Alc+Crt-Bund   Ambulatory referral to Ophthalmology   Bilateral leg ulcer (HCC)   Has upcoming surgery Encouraged to maintain close  follow-up with the specialist at wound care center Continue oxycodone , gabapentin  and ibuprofen  for pain      Vitamin D  deficiency   Encouraged to take vitamin D  50,000 units once weekly as ordered  Ref Range & Units 1 mo ago  Vitamin D  25-Hydroxy 30.0 - 100.0 ng/mL 21 Low         Screening for cervical cancer   Pap smear completed      Relevant Orders   Cytology - PAP([place])   Screening mammogram for breast cancer   Screening mammogram ordered      Relevant Orders   MM 3D SCREENING MAMMOGRAM BILATERAL BREAST    No orders of the defined types were placed in this encounter.   Follow-up: Return in about 3 months (around 01/07/2024) for sickle cell pain.    Raisha Brabender R Tanaysia Bhardwaj, FNP

## 2023-10-07 NOTE — Assessment & Plan Note (Signed)
 Screening mammogram ordered

## 2023-10-08 ENCOUNTER — Ambulatory Visit: Payer: Self-pay | Admitting: Nurse Practitioner

## 2023-10-08 DIAGNOSIS — R87610 Atypical squamous cells of undetermined significance on cytologic smear of cervix (ASC-US): Secondary | ICD-10-CM

## 2023-10-08 LAB — CBC
Hematocrit: 16 % — CL (ref 34.0–46.6)
Hemoglobin: 5.5 g/dL — CL (ref 11.1–15.9)
MCH: 29.9 pg (ref 26.6–33.0)
MCHC: 34.4 g/dL (ref 31.5–35.7)
MCV: 87 fL (ref 79–97)
NRBC: 12 % — ABNORMAL HIGH (ref 0–0)
Platelets: 434 10*3/uL (ref 150–450)
RBC: 1.84 x10E6/uL — CL (ref 3.77–5.28)
RDW: 21.1 % — ABNORMAL HIGH (ref 11.7–15.4)
WBC: 12.7 10*3/uL — ABNORMAL HIGH (ref 3.4–10.8)

## 2023-10-09 ENCOUNTER — Non-Acute Institutional Stay (HOSPITAL_COMMUNITY)
Admission: RE | Admit: 2023-10-09 | Discharge: 2023-10-09 | Disposition: A | Discharge status: Home / Self Care | Source: Ambulatory Visit | Attending: Internal Medicine | Admitting: Internal Medicine

## 2023-10-09 DIAGNOSIS — D571 Sickle-cell disease without crisis: Secondary | ICD-10-CM | POA: Diagnosis present

## 2023-10-09 LAB — DRUG SCREEN 764883 11+OXYCO+ALC+CRT-BUND

## 2023-10-09 LAB — PREPARE RBC (CROSSMATCH)

## 2023-10-09 MED ORDER — DIPHENHYDRAMINE HCL 25 MG PO CAPS: 25.0000 mg | ORAL_CAPSULE | Freq: Once | ORAL | Status: DC

## 2023-10-09 MED ORDER — ACETAMINOPHEN 325 MG PO TABS: 650.0000 mg | ORAL_TABLET | Freq: Once | ORAL | Status: DC

## 2023-10-09 MED ORDER — SODIUM CHLORIDE 0.9% IV SOLUTION: Freq: Once | INTRAVENOUS | Status: DC

## 2023-10-09 NOTE — Progress Notes (Signed)
 Patient came to clinic today for blood transusion. Pts Hgb on 5/20 resulted at 5.5. Per provider, Marylu Soda, NP pt will receive 2 units PRBCS today. Type and Screen drawn. Per blood bank pt has an antibody and the units for pt have been ordered. Will not have time to receive blood transfusion today, patient will return in the morning for transfusion. Provider notified. Pt advised to keep her blue blood bank bracelet on her wrist, pt verbalized understanding. Pt is alert, oriented, and ambulatory at discharge.

## 2023-10-10 ENCOUNTER — Non-Acute Institutional Stay (HOSPITAL_COMMUNITY)
Admission: RE | Admit: 2023-10-10 | Discharge: 2023-10-10 | Disposition: A | Discharge status: Home / Self Care | Source: Ambulatory Visit | Attending: Nurse Practitioner | Admitting: Nurse Practitioner

## 2023-10-10 VITALS — BP 127/66 | HR 58 | Temp 98.2°F | Resp 16

## 2023-10-10 DIAGNOSIS — D571 Sickle-cell disease without crisis: Secondary | ICD-10-CM | POA: Diagnosis present

## 2023-10-10 MED ORDER — DIPHENHYDRAMINE HCL 25 MG PO CAPS
25.0000 mg | ORAL_CAPSULE | Freq: Once | ORAL | Status: AC
  Administered 2023-10-10: 25 mg via ORAL
  Filled 2023-10-10: qty 1

## 2023-10-10 MED ORDER — ACETAMINOPHEN 325 MG PO TABS
650.0000 mg | ORAL_TABLET | Freq: Once | ORAL | Status: AC
  Administered 2023-10-10: 650 mg via ORAL
  Filled 2023-10-10: qty 2

## 2023-10-10 MED ORDER — SODIUM CHLORIDE 0.9% IV SOLUTION: Freq: Once | INTRAVENOUS | Status: AC

## 2023-10-10 NOTE — Progress Notes (Signed)
 [facility] NOTE   Diagnosis: Sickle Cell Anemia without crisis   Provider: Darvin England, NP   Procedure: Blood transfusion   Note: Patient received transfusion of 2 units PRBC's via PIV. Patient pre-medicated with PO Tylenol  and Benadryl  per order. Patient tolerated transfusion well with no adverse reaction.  Vital signs stable. AVS offered but patient refused. Patient scheduled to get her labs rechecked with her PCP next Tuesday. Patient alert, oriented and ambulatory at discharge.

## 2023-10-12 LAB — DRUG SCREEN 764883 11+OXYCO+ALC+CRT-BUND
Hydrocodone: 58.7 mg/dL (ref 20.0–300.0)
pH, Urine: 5.3 (ref 4.5–8.9)

## 2023-10-12 LAB — OXYCODONE/OXYMORPHONE, CONFIRM
OXYCODONE/OXYMORPH: POSITIVE — AB
OXYCODONE: 1721 ng/mL
OXYCODONE: POSITIVE — AB
OXYMORPHONE (GC/MS): 3864 ng/mL
OXYMORPHONE: POSITIVE — AB

## 2023-10-12 LAB — OPIATES CONFIRMATION, URINE: Opiates: NEGATIVE ng/mL

## 2023-10-13 LAB — TYPE AND SCREEN
ABO/RH(D): B POS
Antibody Screen: POSITIVE
Unit division: 0
Unit division: 0

## 2023-10-13 LAB — BPAM RBC
Blood Product Expiration Date: 202506252359
Blood Product Expiration Date: 202506262359
ISSUE DATE / TIME: 202505230912
ISSUE DATE / TIME: 202505230912
Unit Type and Rh: 7300
Unit Type and Rh: 7300

## 2023-10-14 ENCOUNTER — Other Ambulatory Visit: Payer: Self-pay | Admitting: Nurse Practitioner

## 2023-10-14 ENCOUNTER — Other Ambulatory Visit: Payer: Self-pay

## 2023-10-14 DIAGNOSIS — D571 Sickle-cell disease without crisis: Secondary | ICD-10-CM

## 2023-10-15 ENCOUNTER — Ambulatory Visit: Payer: Self-pay | Admitting: Nurse Practitioner

## 2023-10-15 LAB — CBC WITH DIFFERENTIAL/PLATELET
Basophils Absolute: 0.2 10*3/uL (ref 0.0–0.2)
Basos: 2 %
EOS (ABSOLUTE): 0.5 10*3/uL — ABNORMAL HIGH (ref 0.0–0.4)
Eos: 4 %
Hematocrit: 24.7 % — ABNORMAL LOW (ref 34.0–46.6)
Hemoglobin: 8.2 g/dL — ABNORMAL LOW (ref 11.1–15.9)
Immature Grans (Abs): 0.1 10*3/uL (ref 0.0–0.1)
Immature Granulocytes: 1 %
Lymphocytes Absolute: 2.2 10*3/uL (ref 0.7–3.1)
Lymphs: 21 %
MCH: 29.1 pg (ref 26.6–33.0)
MCHC: 33.2 g/dL (ref 31.5–35.7)
MCV: 88 fL (ref 79–97)
Monocytes Absolute: 1.7 10*3/uL — ABNORMAL HIGH (ref 0.1–0.9)
Monocytes: 16 %
NRBC: 2 % — ABNORMAL HIGH (ref 0–0)
Neutrophils Absolute: 6.2 10*3/uL (ref 1.4–7.0)
Neutrophils: 56 %
Platelets: 481 10*3/uL — ABNORMAL HIGH (ref 150–450)
RBC: 2.82 x10E6/uL — ABNORMAL LOW (ref 3.77–5.28)
RDW: 19.4 % — ABNORMAL HIGH (ref 11.7–15.4)
WBC: 10.7 10*3/uL (ref 3.4–10.8)

## 2023-10-16 ENCOUNTER — Other Ambulatory Visit: Payer: Self-pay | Admitting: Family Medicine

## 2023-10-16 DIAGNOSIS — E559 Vitamin D deficiency, unspecified: Secondary | ICD-10-CM

## 2023-10-22 ENCOUNTER — Other Ambulatory Visit: Payer: Self-pay | Admitting: Nurse Practitioner

## 2023-10-22 DIAGNOSIS — D5700 Hb-SS disease with crisis, unspecified: Secondary | ICD-10-CM

## 2023-10-22 DIAGNOSIS — G894 Chronic pain syndrome: Secondary | ICD-10-CM

## 2023-10-22 MED ORDER — OXYCODONE HCL 10 MG PO TABS: 10.0000 mg | ORAL_TABLET | ORAL | 0 refills | Status: DC | PRN

## 2023-10-22 NOTE — Progress Notes (Signed)
 Reviewed PDMP substance reporting system prior to prescribing opiate medications. No inconsistencies noted.   1. Sickle-cell disease with pain (HCC)  - Oxycodone  HCl 10 MG TABS; Take 1 tablet (10 mg total) by mouth every 4 (four) hours as needed.  Dispense: 90 tablet; Refill: 0  2. Chronic pain syndrome  - Oxycodone  HCl 10 MG TABS; Take 1 tablet (10 mg total) by mouth every 4 (four) hours as needed.  Dispense: 90 tablet; Refill: 0

## 2023-10-22 NOTE — Telephone Encounter (Signed)
 Copied from CRM [id number]. Topic: Clinical - Medication Refill >> Oct 22, 2023  1:22 PM Lesta Rater S wrote: Medication: Oxycodone  HCl 10 MG TABS   Has the patient contacted their pharmacy? No (Agent: If no, request that the patient contact the pharmacy for the refill. If patient does not wish to contact the pharmacy document the reason why and proceed with request.) (Agent: If yes, when and what did the pharmacy advise?)  This is the patient's preferred pharmacy:  [facility]ORE #[id number] [facility], [place] - [address] AT [facility] [address] [place] [address] Phone: [phone] Fax: [phone]  Is this the correct pharmacy for this prescription? Yes If no, delete pharmacy and type the correct one.   Has the prescription been filled recently? Yes  Is the patient out of the medication? Yes  Has the patient been seen for an appointment in the last year OR does the patient have an upcoming appointment? Yes  Can we respond through MyChart? Yes  Agent: Please be advised that Rx refills may take up to 3 business days. We ask that you follow-up with your pharmacy.

## 2023-10-24 ENCOUNTER — Encounter (HOSPITAL_BASED_OUTPATIENT_CLINIC_OR_DEPARTMENT_OTHER): Attending: General Surgery | Admitting: General Surgery

## 2023-10-24 DIAGNOSIS — L97818 Non-pressure chronic ulcer of other part of right lower leg with other specified severity: Secondary | ICD-10-CM | POA: Insufficient documentation

## 2023-10-24 DIAGNOSIS — D571 Sickle-cell disease without crisis: Secondary | ICD-10-CM | POA: Insufficient documentation

## 2023-10-24 DIAGNOSIS — L97828 Non-pressure chronic ulcer of other part of left lower leg with other specified severity: Secondary | ICD-10-CM | POA: Insufficient documentation

## 2023-10-27 LAB — CYTOLOGY - PAP
Comment: NEGATIVE
Comment: NEGATIVE
Comment: NEGATIVE
Diagnosis: UNDETERMINED — AB
HPV 16: POSITIVE — AB
HPV 18 / 45: NEGATIVE
High risk HPV: POSITIVE — AB

## 2023-10-29 ENCOUNTER — Ambulatory Visit
Admission: RE | Admit: 2023-10-29 | Discharge: 2023-10-29 | Disposition: A | Discharge status: Home / Self Care | Source: Ambulatory Visit | Attending: Nurse Practitioner | Admitting: Nurse Practitioner

## 2023-10-29 DIAGNOSIS — Z1231 Encounter for screening mammogram for malignant neoplasm of breast: Secondary | ICD-10-CM

## 2023-11-03 ENCOUNTER — Other Ambulatory Visit: Payer: Self-pay

## 2023-11-03 MED ORDER — ENSURE HIGH PROTEIN PO LIQD: 1.0000 | Freq: Three times a day (TID) | ORAL | 6 refills | Status: AC

## 2023-11-04 ENCOUNTER — Other Ambulatory Visit: Payer: Self-pay

## 2023-11-12 ENCOUNTER — Other Ambulatory Visit: Payer: Self-pay | Admitting: Nurse Practitioner

## 2023-11-12 DIAGNOSIS — G894 Chronic pain syndrome: Secondary | ICD-10-CM

## 2023-11-12 DIAGNOSIS — D5700 Hb-SS disease with crisis, unspecified: Secondary | ICD-10-CM

## 2023-11-12 MED ORDER — OXYCODONE HCL 10 MG PO TABS
10.0000 mg | ORAL_TABLET | ORAL | 0 refills | Status: DC | PRN
Start: 2023-11-12 — End: 2023-12-02

## 2023-11-12 NOTE — Telephone Encounter (Signed)
 Please advise [facility]

## 2023-11-12 NOTE — Progress Notes (Signed)
 Reviewed PDMP substance reporting system prior to prescribing opiate medications. No inconsistencies noted.   1. Sickle-cell disease with pain (HCC)  - Oxycodone  HCl 10 MG TABS; Take 1 tablet (10 mg total) by mouth every 4 (four) hours as needed.  Dispense: 90 tablet; Refill: 0  2. Chronic pain syndrome  - Oxycodone  HCl 10 MG TABS; Take 1 tablet (10 mg total) by mouth every 4 (four) hours as needed.  Dispense: 90 tablet; Refill: 0

## 2023-11-12 NOTE — Telephone Encounter (Unsigned)
 Copied from CRM [id number]. Topic: Clinical - Medication Refill >> Nov 12, 2023 12:15 PM Turkey B wrote: Medication: Oxycodone  HCl 10 MG TABS  Has the patient contacted their pharmacy? no Because of type of med this is  This is the patient's preferred pharmacy:  [facility] #[id number] [facility], [place] - [address] AT [facility] [address] [place] [address] Phone: [phone] Fax: [phone]   Is this the correct pharmacy for this prescription? yes  Has the prescription been filled recently? no  Is the patient out of the medication? yes  Has the patient been seen for an appointment in the last year OR does the patient have an upcoming appointment? yes  Can we respond through MyChart? no  Agent: Please be advised that Rx refills may take up to 3 business days. We ask that you follow-up with your pharmacy.

## 2023-11-13 ENCOUNTER — Encounter (HOSPITAL_BASED_OUTPATIENT_CLINIC_OR_DEPARTMENT_OTHER): Admitting: General Surgery

## 2023-11-13 DIAGNOSIS — D571 Sickle-cell disease without crisis: Secondary | ICD-10-CM | POA: Diagnosis not present

## 2023-11-13 DIAGNOSIS — L97818 Non-pressure chronic ulcer of other part of right lower leg with other specified severity: Secondary | ICD-10-CM | POA: Diagnosis present

## 2023-11-13 DIAGNOSIS — L97828 Non-pressure chronic ulcer of other part of left lower leg with other specified severity: Secondary | ICD-10-CM | POA: Diagnosis not present

## 2023-11-18 ENCOUNTER — Other Ambulatory Visit: Payer: Self-pay

## 2023-11-24 ENCOUNTER — Other Ambulatory Visit: Payer: Self-pay

## 2023-11-27 ENCOUNTER — Other Ambulatory Visit: Payer: Self-pay

## 2023-12-02 ENCOUNTER — Other Ambulatory Visit: Payer: Self-pay | Admitting: Family Medicine

## 2023-12-02 ENCOUNTER — Other Ambulatory Visit: Payer: Self-pay | Admitting: Nurse Practitioner

## 2023-12-02 DIAGNOSIS — G894 Chronic pain syndrome: Secondary | ICD-10-CM

## 2023-12-02 DIAGNOSIS — D5700 Hb-SS disease with crisis, unspecified: Secondary | ICD-10-CM

## 2023-12-02 DIAGNOSIS — K5903 Drug induced constipation: Secondary | ICD-10-CM

## 2023-12-02 MED ORDER — OXYCODONE HCL 10 MG PO TABS: 10.0000 mg | ORAL_TABLET | ORAL | 0 refills | Status: DC | PRN

## 2023-12-02 NOTE — Telephone Encounter (Unsigned)
 Copied from CRM [id number]. Topic: Clinical - Medication Refill >> Dec 02, 2023 10:50 AM Tiffini S wrote: Medication: Oxycodone  HCl 10 MG TABS  Has the patient contacted their pharmacy? No (Agent: If no, request that the patient contact the pharmacy for the refill. If patient does not wish to contact the pharmacy document the reason why and proceed with request.) (Agent: If yes, when and what did the pharmacy advise?)  This is the patient's preferred pharmacy:  [facility] #[id number] [facility], [place] - [address] AT [facility] [address] [place] [address] Phone: [phone] Fax: [phone]   Is this the correct pharmacy for this prescription? Yes If no, delete pharmacy and type the correct one.   Has the prescription been filled recently? Yes  Is the patient out of the medication? Yes, patient take the last tablet yesterday   Has the patient been seen for an appointment in the last year OR does the patient have an upcoming appointment? Yes  Can we respond through MyChart? No, patient asked for phone call  Agent: Please be advised that Rx refills may take up to 3 business days. We ask that you follow-up with your pharmacy.

## 2023-12-02 NOTE — Telephone Encounter (Signed)
 11/12/23 90 tabs/0 RF (15 day)

## 2023-12-02 NOTE — Progress Notes (Signed)
 Reviewed PDMP substance reporting system prior to prescribing opiate medications. No inconsistencies noted.   1. Sickle-cell disease with pain (HCC)  - Oxycodone  HCl 10 MG TABS; Take 1 tablet (10 mg total) by mouth every 4 (four) hours as needed.  Dispense: 90 tablet; Refill: 0  2. Chronic pain syndrome  - Oxycodone  HCl 10 MG TABS; Take 1 tablet (10 mg total) by mouth every 4 (four) hours as needed.  Dispense: 90 tablet; Refill: 0

## 2023-12-03 NOTE — Telephone Encounter (Signed)
 Please advise [facility]

## 2023-12-04 ENCOUNTER — Ambulatory Visit (HOSPITAL_BASED_OUTPATIENT_CLINIC_OR_DEPARTMENT_OTHER): Admitting: General Surgery

## 2023-12-16 ENCOUNTER — Other Ambulatory Visit: Payer: Self-pay | Admitting: Nurse Practitioner

## 2023-12-16 DIAGNOSIS — D5700 Hb-SS disease with crisis, unspecified: Secondary | ICD-10-CM

## 2023-12-16 DIAGNOSIS — G894 Chronic pain syndrome: Secondary | ICD-10-CM

## 2023-12-16 MED ORDER — OXYCODONE HCL 10 MG PO TABS: 10.0000 mg | ORAL_TABLET | ORAL | 0 refills | Status: DC | PRN

## 2023-12-16 NOTE — Progress Notes (Signed)
 Reviewed PDMP substance reporting system prior to prescribing opiate medications. No inconsistencies noted.   1. Sickle-cell disease with pain (HCC)  - Oxycodone  HCl 10 MG TABS; Take 1 tablet (10 mg total) by mouth every 4 (four) hours as needed.  Dispense: 90 tablet; Refill: 0  2. Chronic pain syndrome  - Oxycodone  HCl 10 MG TABS; Take 1 tablet (10 mg total) by mouth every 4 (four) hours as needed.  Dispense: 90 tablet; Refill: 0

## 2023-12-16 NOTE — Telephone Encounter (Unsigned)
 Copied from CRM [id number]. Topic: Clinical - Medication Refill >> Dec 16, 2023 11:25 AM Marissa P wrote: Medication: Oxycodone  HCl 10 MG TABS  Has the patient contacted their pharmacy? No (Agent: If no, request that the patient contact the pharmacy for the refill. If patient does not wish to contact the pharmacy document the reason why and proceed with request.) (Agent: If yes, when and what did the pharmacy advise?)  This is the patient's preferred pharmacy:  [facility] #[id number] [facility], [place] - [address] AT [facility] [address] [place] [address] Phone: [phone] Fax: [phone]  Is this the correct pharmacy for this prescription? Yes If no, delete pharmacy and type the correct one.   Has the prescription been filled recently? Yes  Is the patient out of the medication? Yes  Has the patient been seen for an appointment in the last year OR does the patient have an upcoming appointment? Yes  Can we respond through MyChart? No  Agent: Please be advised that Rx refills may take up to 3 business days. We ask that you follow-up with your pharmacy.

## 2023-12-16 NOTE — Telephone Encounter (Signed)
 Please advise [facility]

## 2023-12-29 ENCOUNTER — Other Ambulatory Visit: Payer: Self-pay | Admitting: Nurse Practitioner

## 2023-12-29 DIAGNOSIS — D5700 Hb-SS disease with crisis, unspecified: Secondary | ICD-10-CM

## 2023-12-29 DIAGNOSIS — G894 Chronic pain syndrome: Secondary | ICD-10-CM

## 2023-12-29 NOTE — Telephone Encounter (Signed)
 Please advise [facility]

## 2024-01-01 ENCOUNTER — Other Ambulatory Visit: Payer: Self-pay | Admitting: Nurse Practitioner

## 2024-01-01 DIAGNOSIS — D5700 Hb-SS disease with crisis, unspecified: Secondary | ICD-10-CM

## 2024-01-01 DIAGNOSIS — G894 Chronic pain syndrome: Secondary | ICD-10-CM

## 2024-01-01 NOTE — Telephone Encounter (Unsigned)
 Copied from CRM [id number]. Topic: Clinical - Medication Refill >> [name], 2024 10:22 AM Tobias L wrote: Medication: Oxycodone  HCl 10 MG TABS  Has the patient contacted their pharmacy? No Needs to contact the office for the type of medicine.  This is the patient's preferred pharmacy:  [facility] #[id number] [facility], [place] - [address] AT [facility] [address] [place] [address] Phone: [phone] Fax: [phone]   Is this the correct pharmacy for this prescription? Yes  Has the prescription been filled recently? No  Is the patient out of the medication? Yes  Has the patient been seen for an appointment in the last year OR does the patient have an upcoming appointment? Yes  Can we respond through MyChart? Yes  Agent: Please be advised that Rx refills may take up to 3 business days. We ask that you follow-up with your pharmacy.

## 2024-01-02 MED ORDER — OXYCODONE HCL 10 MG PO TABS
10.0000 mg | ORAL_TABLET | ORAL | 0 refills | Status: DC | PRN
Start: 2024-01-02 — End: 2024-01-30

## 2024-01-02 NOTE — Telephone Encounter (Signed)
 Reviewed PDMP substance reporting system prior to prescribing opiate medications. No inconsistencies noted.   1. Sickle-cell disease with pain (HCC)  - Oxycodone  HCl 10 MG TABS; Take 1 tablet (10 mg total) by mouth every 4 (four) hours as needed.  Dispense: 90 tablet; Refill: 0  2. Chronic pain syndrome  - Oxycodone  HCl 10 MG TABS; Take 1 tablet (10 mg total) by mouth every 4 (four) hours as needed.  Dispense: 90 tablet; Refill: 0

## 2024-01-08 ENCOUNTER — Encounter (HOSPITAL_BASED_OUTPATIENT_CLINIC_OR_DEPARTMENT_OTHER): Attending: General Surgery | Admitting: General Surgery

## 2024-01-08 DIAGNOSIS — D571 Sickle-cell disease without crisis: Secondary | ICD-10-CM | POA: Insufficient documentation

## 2024-01-08 DIAGNOSIS — L97828 Non-pressure chronic ulcer of other part of left lower leg with other specified severity: Secondary | ICD-10-CM | POA: Insufficient documentation

## 2024-01-08 DIAGNOSIS — L97818 Non-pressure chronic ulcer of other part of right lower leg with other specified severity: Secondary | ICD-10-CM | POA: Diagnosis present

## 2024-01-09 ENCOUNTER — Ambulatory Visit: Payer: Self-pay | Admitting: Nurse Practitioner

## 2024-01-09 ENCOUNTER — Encounter: Payer: Self-pay | Admitting: Nurse Practitioner

## 2024-01-09 VITALS — BP 115/62 | HR 83 | Wt 128.2 lb

## 2024-01-09 DIAGNOSIS — N3001 Acute cystitis with hematuria: Secondary | ICD-10-CM

## 2024-01-09 DIAGNOSIS — L97919 Non-pressure chronic ulcer of unspecified part of right lower leg with unspecified severity: Secondary | ICD-10-CM

## 2024-01-09 DIAGNOSIS — L97929 Non-pressure chronic ulcer of unspecified part of left lower leg with unspecified severity: Secondary | ICD-10-CM

## 2024-01-09 DIAGNOSIS — D5700 Hb-SS disease with crisis, unspecified: Secondary | ICD-10-CM

## 2024-01-09 DIAGNOSIS — R87610 Atypical squamous cells of undetermined significance on cytologic smear of cervix (ASC-US): Secondary | ICD-10-CM | POA: Diagnosis not present

## 2024-01-09 DIAGNOSIS — E559 Vitamin D deficiency, unspecified: Secondary | ICD-10-CM

## 2024-01-09 DIAGNOSIS — N3000 Acute cystitis without hematuria: Secondary | ICD-10-CM | POA: Insufficient documentation

## 2024-01-09 DIAGNOSIS — R87810 Cervical high risk human papillomavirus (HPV) DNA test positive: Secondary | ICD-10-CM

## 2024-01-09 LAB — POCT URINALYSIS DIP (CLINITEK)
Bilirubin, UA: NEGATIVE
Glucose, UA: NEGATIVE mg/dL
Ketones, POC UA: NEGATIVE mg/dL
Nitrite, UA: NEGATIVE
POC PROTEIN,UA: NEGATIVE
Spec Grav, UA: 1.02 (ref 1.010–1.025)
Urobilinogen, UA: 0.2 U/dL
pH, UA: 5 (ref 5.0–8.0)

## 2024-01-09 MED ORDER — SULFAMETHOXAZOLE-TRIMETHOPRIM 800-160 MG PO TABS: 1.0000 | ORAL_TABLET | Freq: Two times a day (BID) | ORAL | 0 refills | Status: DC

## 2024-01-09 NOTE — Patient Instructions (Addendum)
 Please come fasting to your next appointment    [facility] [address] [place] [address]  P:  [phone]  Gynecologist  [facility], [address], [place], [place] [address], Get Driving Directions, Main: [phone]   1. Sickle-cell disease with pain (HCC) (Primary)  - ToxAssure Flex 15, Ur - Sickle Cell Panel  2. Acute cystitis with hematuria  - Urine Culture - sulfamethoxazole -trimethoprim  (BACTRIM  DS) 800-160 MG tablet; Take 1 tablet by mouth 2 (two) times daily.  Dispense: 6 tablet; Refill: 0     It is important that you exercise regularly at least 30 minutes 5 times a week as tolerated  Think about what you will eat, plan ahead. Choose  clean, green, fresh or frozen over canned, processed or packaged foods which are more sugary, salty and fatty. 70 to 75% of food eaten should be vegetables and fruit. Three meals at set times with snacks allowed between meals, but they must be fruit or vegetables. Aim to eat over a 12 hour period , example 7 am to 7 pm, and STOP after  your last meal of the day. Drink water,generally about 64 ounces per day, no other drink is as healthy. Fruit juice is best enjoyed in a healthy way, by EATING the fruit.  Thanks for choosing [facility] we consider it a privelige to serve you.

## 2024-01-09 NOTE — Assessment & Plan Note (Signed)
 Continue vitamin D  50,000 units once weekly as ordered

## 2024-01-09 NOTE — Assessment & Plan Note (Signed)
  Reports strong urine odor, no burning, nausea, vomiting, chest pain, or fever.  UA showed large leukocytes, urine culture obtained.  Bactrim  DS 1 tablet twice daily for 3 days orderd

## 2024-01-09 NOTE — Assessment & Plan Note (Addendum)
  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 Referred to gynecologist, no follow-up. Reassured not cervical cancer but requires evaluation. - Provide correct contact information for the gynecologist to facilitate follow-up. - Reassure that this is not a diagnosis of cervical cancer but requires further evaluation.

## 2024-01-09 NOTE — Assessment & Plan Note (Addendum)
 Chronic leg wounds with severe pain, worsening over two months. Last visit to [facility] care was yesterday center . Pain managed with oxycodone  10 mg every 4 hours as needed and ibuprofen  600 mg 3 times daily as needed, gabapentin  600 mg 3 times daily. Referred to surgeon, follow-up delayed. Dressing changes at home, monitored for infection. Experiencing chills. - Encourage follow-up with the surgeon for potential surgical intervention. - Continue pain management with oxycodone  and ibuprofen  as needed. - Monitor for signs of infection and seek immediate care if symptoms of infection or sickle cell crisis occur.

## 2024-01-09 NOTE — Assessment & Plan Note (Addendum)
  Sickle cell disease with low blood counts, requiring transfusions.  Hematology at discussed simple exchange to reduce hemoglobin as percentage which would aid in helping promote blood flow that can in turn promote wound healing  Missed hematology appointments. Hematologist's interventions may aid wound healing. Advised on hydration and infection avoidance. - Recheck blood counts today. - Encourage follow-up with the hematologist for specialized care and potential interventions to promote healing. - Advise on maintaining hydration and avoiding infection triggers. Continue folic acid  1 mg daily

## 2024-01-09 NOTE — Progress Notes (Signed)
 Established Patient Office Visit  Subjective:  Patient ID: [name], female    DOB: [birth year]  Age: 53 y.o. MRN: [id number]  CC:  Chief Complaint  Patient presents with   Medical Management of Chronic Issues   Dizziness    HPI  Discussed the use of AI scribe software for clinical note transcription with the patient, who gave verbal consent to proceed.  History of Present Illness [name] is a 53 year old female with chronic leg wounds who presents with worsening leg pain and delayed surgical intervention. She is accompanied by her son, Musa.  She has been experiencing worsening pain in her legs. The pain is severe, rated more than ten on a scale of zero to ten, and is localized to her legs. She was seen by a surgeon on July 10th, who told her that surgery would be performed, but she has not heard back regarding the surgery date, and it has been almost two months since the consultation.  She manages her wound care at home, performing dressing changes herself, as the wound care doctor is unable to touch her legs due to the severity of the pain. She visits the wound care doctor every three weeks but feels that no significant interventions are being made to improve her condition.  She has a history of low blood counts and has previously received blood transfusions. She has been seen by a hematologist at [facility] but has missed recent appointments due to the inconvenience of separate visits for blood work and consultations. Her blood was low during her last visit.  She reports a urinary symptom of foul-smelling urine but no burning sensation. She occasionally experiences chills, which she attributes to her leg condition. No nausea, vomiting, chest pain, or shortness of breath.  Her current medications include baby aspirin  81 mg daily, folic acid  once daily, gabapentin  600 mg three times daily, ibuprofen  600 mg three times daily as needed, oxycodone  10 mg every four hours as needed,  and vitamin D  50,000 units weekly. She is not taking  Jeuveau powder due to insurance coverage issues.     Past Medical History:  Diagnosis Date   Anemia 03/30/2012   Hx of sickle cell disease   CAP (community acquired pneumonia)    2014   Cholelithiasis 03/30/2012   Chronic wound of extremity 04/01/2012   Elevated LFTs    Leg ulcer (HCC) 10/27/2012   Chronic under care of wound clinic   Multiple open wounds of lower extremity    chronic wounds B/LLE   Peripheral arterial disease (HCC)    Sickle cell disease (HCC)    Sinus bradycardia by electrocardiogram 04/03/2012    Past Surgical History:  Procedure Laterality Date   ALLOGRAFT APPLICATION Right 02/14/2015   Procedure: SURGICAL PREP FOR GRAFTING RIGHT LOWER EXTREMITY AND APPLICATION OF THERASKIN;  Surgeon: Earlis Ranks, MD;  Location: [facility];  Service: Plastics;  Laterality: Right;   APPLICATION OF A-CELL OF EXTREMITY Right 10/09/2015   Procedure: APPLICATION OF THERASKIN;  Surgeon: Earlis Ranks, MD;  Location: [facility];  Service: Plastics;  Laterality: Right;   BREAST SURGERY     left breast cyst aspiration   CESAREAN SECTION     CESAREAN SECTION N/A 01/06/2014   Procedure: CESAREAN SECTION;  Surgeon: Harland JAYSON Birkenhead, MD;  Location: [facility];  Service: Obstetrics;  Laterality: N/A;   CESAREAN SECTION N/A 05/04/2017   Procedure: CESAREAN SECTION;  Surgeon: Eveline Lynwood MATSU, MD;  Location: [facility];  Service: Obstetrics;  Laterality: N/A;   I & D EXTREMITY Right 10/09/2015   Procedure: SURGICAL PREPARATION FOR GRAFTING RIGHT ANKLE AND APPLICATION THERASKIN;  Surgeon: Earlis Ranks, MD;  Location: [facility];  Service: Plastics;  Laterality: Right;   SKIN FULL THICKNESS GRAFT Bilateral 06/17/2016   Procedure: SURGICAL PREP FOR GRAFTING, BILATERAL LOWER EXTREMITIES AND APPLICATION OF THERASKIN;  Surgeon: Earlis Ranks, MD;  Location: [facility];  Service: Plastics;   Laterality: Bilateral;   TONSILLECTOMY      Family History  Problem Relation Age of Onset   Sickle cell anemia Sister    Diabetes Mother    Asthma Mother    Sickle cell trait Mother    Sickle cell trait Father    Hypertension Father     Social History   Socioeconomic History   Marital status: Married    Spouse name: Fredia Burnet   Number of children: 3   Years of education: Not on file   Highest education level: Not on file  Occupational History   Occupation: Employed in home care.  Tobacco Use   Smoking status: Never    Passive exposure: Never   Smokeless tobacco: Never  Vaping Use   Vaping status: Never Used  Substance and Sexual Activity   Alcohol use: No   Drug use: No   Sexual activity: Yes    Birth control/protection: None  Other Topics Concern   Not on file  Social History Narrative   Lives with husband.   Social Drivers of Corporate investment banker Strain: Not on file  Food Insecurity: Low Risk  (05/24/2022)   Received from [facility]   Hunger Vital Sign    Within the past 12 months, you worried that your food would run out before you got money to buy more: Never true    Within the past 12 months, the food you bought just didn't last and you didn't have money to get more: Not on file  Transportation Needs: Not on file (05/24/2022)  Physical Activity: Not on file  Stress: Not on file  Social Connections: Not on file  Intimate Partner Violence: Low Risk  (05/24/2022)   Received from [facility] visits prior to 07/20/2022.   Safety    How often does anyone, including family and friends, physically hurt you?: Never    How often does anyone, including family and friends, insult or talk down to you?: Never    How often does anyone, including family and friends, threaten you with harm?: Never    How often does anyone, including family and friends, scream or curse at you?: Never    Outpatient Medications Prior to Visit   Medication Sig Dispense Refill   ASPIRIN  LOW DOSE 81 MG tablet TAKE 1 TABLET(81 MG) BY MOUTH DAILY 30 tablet 12   folic acid  (FOLVITE ) 1 MG tablet Take 1 tablet (1 mg total) by mouth daily. 90 tablet 3   gabapentin  (NEURONTIN ) 600 MG tablet TAKE 1 TABLET(600 MG) BY MOUTH THREE TIMES DAILY 90 tablet 2   ibuprofen  (ADVIL ) 600 MG tablet TAKE 1 TABLET(600 MG) BY MOUTH EVERY 8 HOURS AS NEEDED 30 tablet 5   Nutritional Supplements (ENSURE HIGH PROTEIN) LIQD Take 1 each by mouth in the morning, at noon, and at bedtime. 474 mL 6   Nutritional Supplements (JUVEN) POWD Take 1 each by mouth 3 (three) times daily between meals. 545 g 6   Oxycodone  HCl 10 MG TABS Take  1 tablet (10 mg total) by mouth every 4 (four) hours as needed. 90 tablet 0   polyethylene glycol powder (GLYCOLAX /MIRALAX ) 17 GM/SCOOP powder TAKE 17GRAMS MIXED IN FLUID BY MOUTH 2(TWO) TIMES DAILY AS NEEDED 510 g 3   Vitamin D , Ergocalciferol , (DRISDOL ) 1.25 MG (50000 UNIT) CAPS capsule TAKE 1 CAPSULE BY MOUTH EVERY 7 DAYS 5 capsule 2   Deferiprone  500 MG TABS Take 500 mg by mouth daily at 12 noon. (Patient not taking: Reported on 01/09/2024) 90 tablet 2   No facility-administered medications prior to visit.    No Known Allergies  ROS Review of Systems  Constitutional:  Negative for appetite change, chills, fatigue and fever.  HENT:  Negative for congestion, postnasal drip, rhinorrhea and sneezing.   Respiratory:  Negative for cough, shortness of breath and wheezing.   Cardiovascular:  Negative for chest pain, palpitations and leg swelling.  Gastrointestinal:  Negative for abdominal pain, constipation, nausea and vomiting.  Genitourinary:  Negative for difficulty urinating, dysuria, flank pain and frequency.  Musculoskeletal:  Negative for joint swelling and myalgias.  Skin:  Positive for wound.  Neurological:  Negative for dizziness, facial asymmetry, weakness, numbness and headaches.  Psychiatric/Behavioral:  Negative for behavioral  problems, confusion, self-injury and suicidal ideas.       Objective:    Physical Exam Vitals and nursing note reviewed.  Constitutional:      General: She is not in acute distress.    Appearance: Normal appearance. She is not ill-appearing, toxic-appearing or diaphoretic.  Eyes:     General: No scleral icterus.       Right eye: No discharge.        Left eye: No discharge.     Extraocular Movements: Extraocular movements intact.     Conjunctiva/sclera: Conjunctivae normal.  Cardiovascular:     Rate and Rhythm: Normal rate and regular rhythm.     Pulses: Normal pulses.     Heart sounds: Normal heart sounds. No murmur heard.    No friction rub. No gallop.  Pulmonary:     Effort: Pulmonary effort is normal. No respiratory distress.     Breath sounds: Normal breath sounds. No stridor. No wheezing, rhonchi or rales.  Chest:     Chest wall: No tenderness.  Abdominal:     General: There is no distension.     Palpations: Abdomen is soft.     Tenderness: There is no abdominal tenderness. There is no right CVA tenderness, left CVA tenderness or guarding.  Musculoskeletal:        General: No swelling, tenderness, deformity or signs of injury.     Right lower leg: No edema.     Left lower leg: No edema.  Skin:    General: Skin is warm and dry.     Capillary Refill: Capillary refill takes less than 2 seconds.     Coloration: Skin is not jaundiced or pale.     Findings: No bruising or erythema.     Comments: Bilateral lower extremity wound with  dressing in place unable to assess  Neurological:     Mental Status: She is alert and oriented to person, place, and time.     Motor: No weakness.     Gait: Gait normal.  Psychiatric:        Mood and Affect: Mood normal.        Behavior: Behavior normal.        Thought Content: Thought content normal.        Judgment:  Judgment normal.     BP 115/62 (BP Location: Right Arm, Patient Position: Sitting, Cuff Size: Normal)   Pulse 83   Wt  128 lb 3.2 oz (58.2 kg)   SpO2 (!) 88%   BMI 20.08 kg/m  Wt Readings from Last 3 Encounters:  01/09/24 128 lb 3.2 oz (58.2 kg)  10/07/23 123 lb 6.4 oz (56 kg)  07/08/23 121 lb (54.9 kg)    Lab Results  Component Value Date   TSH 0.099 (L) 03/20/2017   Lab Results  Component Value Date   WBC 10.7 10/14/2023   HGB 8.2 (L) 10/14/2023   HCT 24.7 (L) 10/14/2023   MCV 88 10/14/2023   PLT 481 (H) 10/14/2023   Lab Results  Component Value Date   NA 140 07/08/2023   K 4.2 07/08/2023   CHLORIDE 107 01/20/2014   CO2 18 (L) 07/08/2023   GLUCOSE 82 07/08/2023   BUN 12 07/08/2023   CREATININE 0.61 07/08/2023   BILITOT 3.4 (H) 07/08/2023   ALKPHOS 82 07/08/2023   AST 50 (H) 07/08/2023   ALT 22 07/08/2023   PROT 7.5 07/08/2023   ALBUMIN 4.2 07/08/2023   CALCIUM  9.5 07/08/2023   ANIONGAP 6 10/28/2022   EGFR 108 07/08/2023   Lab Results  Component Value Date   CHOL 143 11/25/2014   Lab Results  Component Value Date   HDL 27 (L) 11/25/2014   Lab Results  Component Value Date   LDLCALC 89 11/25/2014   Lab Results  Component Value Date   TRIG 137 11/25/2014   Lab Results  Component Value Date   CHOLHDL 5.3 11/25/2014   Lab Results  Component Value Date   HGBA1C 4.4 (L) 03/20/2017      Assessment & Plan:  Assessment and Plan Assessment & Plan    Problem List Items Addressed This Visit       Genitourinary   Acute cystitis with hematuria    Reports strong urine odor, no burning, nausea, vomiting, chest pain, or fever.  UA showed large leukocytes, urine culture obtained.  Bactrim  DS 1 tablet twice daily for 3 days orderd      Relevant Medications   sulfamethoxazole -trimethoprim  (BACTRIM  DS) 800-160 MG tablet   Other Relevant Orders   Urine Culture   POCT URINALYSIS DIP (CLINITEK) (Completed)     Other   Sickle-cell disease with pain (HCC) - Primary    Sickle cell disease with low blood counts, requiring transfusions.  Hematology at discussed simple  exchange to reduce hemoglobin as percentage which would aid in helping promote blood flow that can in turn promote wound healing  Missed hematology appointments. Hematologist's interventions may aid wound healing. Advised on hydration and infection avoidance. - Recheck blood counts today. - Encourage follow-up with the hematologist for specialized care and potential interventions to promote healing. - Advise on maintaining hydration and avoiding infection triggers. Continue folic acid  1 mg daily      Relevant Orders   ToxAssure Flex 15, Ur   Sickle Cell Panel   Bilateral leg ulcer (HCC)   Chronic leg wounds with severe pain, worsening over two months. Last visit to [facility] care was yesterday center . Pain managed with oxycodone  10 mg every 4 hours as needed and ibuprofen  600 mg 3 times daily as needed, gabapentin  600 mg 3 times daily. Referred to surgeon, follow-up delayed. Dressing changes at home, monitored for infection. Experiencing chills. - Encourage follow-up with the surgeon for potential surgical intervention. - Continue pain management with oxycodone   and ibuprofen  as needed. - Monitor for signs of infection and seek immediate care if symptoms of infection or sickle cell crisis occur.      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 Referred to gynecologist, no follow-up. Reassured not cervical cancer but requires evaluation. - Provide correct contact information for the gynecologist to facilitate follow-up. - Reassure that this is not a diagnosis of cervical cancer but requires further evaluation.        Meds ordered this encounter  Medications   sulfamethoxazole -trimethoprim  (BACTRIM  DS) 800-160 MG tablet    Sig: Take 1 tablet by mouth 2 (two) times daily.    Dispense:  6 tablet    Refill:  0    Follow-up: Return in about 3 months (around  04/10/2024) for CPE.    Satomi Buda R Sherita Decoste, FNP

## 2024-01-12 ENCOUNTER — Inpatient Hospital Stay (HOSPITAL_COMMUNITY)
Admission: EM | Admit: 2024-01-12 | Discharge: 2024-01-24 | DRG: 811 | Disposition: A | Discharge status: Home / Self Care | Attending: Internal Medicine | Admitting: Internal Medicine

## 2024-01-12 ENCOUNTER — Ambulatory Visit: Payer: Self-pay | Admitting: Nurse Practitioner

## 2024-01-12 ENCOUNTER — Encounter: Payer: Self-pay | Admitting: *Deleted

## 2024-01-12 ENCOUNTER — Other Ambulatory Visit: Payer: Self-pay

## 2024-01-12 ENCOUNTER — Telehealth (HOSPITAL_COMMUNITY): Payer: Self-pay | Admitting: *Deleted

## 2024-01-12 ENCOUNTER — Encounter (HOSPITAL_COMMUNITY): Payer: Self-pay

## 2024-01-12 DIAGNOSIS — D649 Anemia, unspecified: Principal | ICD-10-CM | POA: Diagnosis present

## 2024-01-12 DIAGNOSIS — L97919 Non-pressure chronic ulcer of unspecified part of right lower leg with unspecified severity: Secondary | ICD-10-CM | POA: Diagnosis present

## 2024-01-12 DIAGNOSIS — G894 Chronic pain syndrome: Secondary | ICD-10-CM | POA: Diagnosis present

## 2024-01-12 DIAGNOSIS — D72829 Elevated white blood cell count, unspecified: Secondary | ICD-10-CM | POA: Diagnosis present

## 2024-01-12 DIAGNOSIS — Z79899 Other long term (current) drug therapy: Secondary | ICD-10-CM

## 2024-01-12 DIAGNOSIS — E559 Vitamin D deficiency, unspecified: Secondary | ICD-10-CM | POA: Diagnosis present

## 2024-01-12 DIAGNOSIS — Z8249 Family history of ischemic heart disease and other diseases of the circulatory system: Secondary | ICD-10-CM

## 2024-01-12 DIAGNOSIS — I739 Peripheral vascular disease, unspecified: Secondary | ICD-10-CM | POA: Diagnosis present

## 2024-01-12 DIAGNOSIS — J9601 Acute respiratory failure with hypoxia: Secondary | ICD-10-CM | POA: Diagnosis not present

## 2024-01-12 DIAGNOSIS — L97929 Non-pressure chronic ulcer of unspecified part of left lower leg with unspecified severity: Secondary | ICD-10-CM | POA: Diagnosis present

## 2024-01-12 DIAGNOSIS — Z7982 Long term (current) use of aspirin: Secondary | ICD-10-CM

## 2024-01-12 DIAGNOSIS — D571 Sickle-cell disease without crisis: Secondary | ICD-10-CM | POA: Diagnosis not present

## 2024-01-12 DIAGNOSIS — N3000 Acute cystitis without hematuria: Secondary | ICD-10-CM | POA: Diagnosis present

## 2024-01-12 DIAGNOSIS — R112 Nausea with vomiting, unspecified: Secondary | ICD-10-CM | POA: Diagnosis present

## 2024-01-12 DIAGNOSIS — M879 Osteonecrosis, unspecified: Secondary | ICD-10-CM | POA: Diagnosis present

## 2024-01-12 DIAGNOSIS — M79604 Pain in right leg: Secondary | ICD-10-CM

## 2024-01-12 DIAGNOSIS — Z833 Family history of diabetes mellitus: Secondary | ICD-10-CM

## 2024-01-12 DIAGNOSIS — D5700 Hb-SS disease with crisis, unspecified: Principal | ICD-10-CM | POA: Diagnosis present

## 2024-01-12 DIAGNOSIS — Z825 Family history of asthma and other chronic lower respiratory diseases: Secondary | ICD-10-CM

## 2024-01-12 DIAGNOSIS — M79605 Pain in left leg: Secondary | ICD-10-CM

## 2024-01-12 DIAGNOSIS — B9620 Unspecified Escherichia coli [E. coli] as the cause of diseases classified elsewhere: Secondary | ICD-10-CM | POA: Diagnosis present

## 2024-01-12 DIAGNOSIS — Z832 Family history of diseases of the blood and blood-forming organs and certain disorders involving the immune mechanism: Secondary | ICD-10-CM

## 2024-01-12 DIAGNOSIS — D638 Anemia in other chronic diseases classified elsewhere: Secondary | ICD-10-CM | POA: Diagnosis present

## 2024-01-12 LAB — CMP14+CBC/D/PLT+FER+RETIC+V...
ALT: 19 IU/L (ref 0–32)
AST: 46 IU/L — ABNORMAL HIGH (ref 0–40)
Albumin: 4 g/dL (ref 3.8–4.9)
Alkaline Phosphatase: 93 IU/L (ref 44–121)
BUN/Creatinine Ratio: 16 (ref 9–23)
BUN: 10 mg/dL (ref 6–24)
Basophils Absolute: 0.2 x10E3/uL (ref 0.0–0.2)
Basos: 1 %
Bilirubin Total: 2.3 mg/dL — ABNORMAL HIGH (ref 0.0–1.2)
CO2: 20 mmol/L (ref 20–29)
Calcium: 9.1 mg/dL (ref 8.7–10.2)
Chloride: 106 mmol/L (ref 96–106)
Creatinine, Ser: 0.61 mg/dL (ref 0.57–1.00)
EOS (ABSOLUTE): 0.8 x10E3/uL — ABNORMAL HIGH (ref 0.0–0.4)
Eos: 5 %
Ferritin: 1767 ng/mL — ABNORMAL HIGH (ref 15–150)
Globulin, Total: 3.4 g/dL (ref 1.5–4.5)
Glucose: 100 mg/dL — ABNORMAL HIGH (ref 70–99)
Hematocrit: 16.6 % — CL (ref 34.0–46.6)
Hemoglobin: 5.6 g/dL — CL (ref 11.1–15.9)
Immature Grans (Abs): 0.2 x10E3/uL — ABNORMAL HIGH (ref 0.0–0.1)
Immature Granulocytes: 1 %
Lymphocytes Absolute: 2.5 x10E3/uL (ref 0.7–3.1)
Lymphs: 15 %
MCH: 30.1 pg (ref 26.6–33.0)
MCHC: 33.7 g/dL (ref 31.5–35.7)
MCV: 89 fL (ref 79–97)
Monocytes Absolute: 1.9 x10E3/uL — ABNORMAL HIGH (ref 0.1–0.9)
Monocytes: 12 %
NRBC: 30 % — ABNORMAL HIGH (ref 0–0)
Neutrophils Absolute: 10.7 x10E3/uL — ABNORMAL HIGH (ref 1.4–7.0)
Neutrophils: 66 %
Platelets: 490 x10E3/uL — ABNORMAL HIGH (ref 150–450)
Potassium: 4.6 mmol/L (ref 3.5–5.2)
RBC: 1.86 x10E6/uL — CL (ref 3.77–5.28)
RDW: 25.9 % — ABNORMAL HIGH (ref 11.7–15.4)
Retic Ct Pct: 24.9 % — ABNORMAL HIGH (ref 0.6–2.6)
Sodium: 139 mmol/L (ref 134–144)
Total Protein: 7.4 g/dL (ref 6.0–8.5)
Vit D, 25-Hydroxy: 38.1 ng/mL (ref 30.0–100.0)
WBC: 16.3 x10E3/uL — ABNORMAL HIGH (ref 3.4–10.8)
eGFR: 107 mL/min/1.73 (ref >59)

## 2024-01-12 LAB — PREPARE RBC (CROSSMATCH)

## 2024-01-12 LAB — CBC WITH DIFFERENTIAL/PLATELET
Abs Granulocyte: 9.3 K/uL — ABNORMAL HIGH (ref 1.5–6.5)
Abs Immature Granulocytes: 0.22 K/uL — ABNORMAL HIGH (ref 0.00–0.07)
Basophils Absolute: 0.1 K/uL (ref 0.0–0.1)
Basophils Relative: 1 %
Eosinophils Absolute: 0.1 K/uL (ref 0.0–0.5)
Eosinophils Relative: 1 %
HCT: 14.2 % — ABNORMAL LOW (ref 36.0–46.0)
Hemoglobin: 4.8 g/dL — CL (ref 12.0–15.0)
Immature Granulocytes: 2 %
Lymphocytes Relative: 14 %
Lymphs Abs: 1.8 K/uL (ref 0.7–4.0)
MCH: 28.9 pg (ref 26.0–34.0)
MCHC: 33.8 g/dL (ref 30.0–36.0)
MCV: 85.5 fL (ref 80.0–100.0)
Monocytes Absolute: 1.5 K/uL — ABNORMAL HIGH (ref 0.1–1.0)
Monocytes Relative: 12 %
Neutro Abs: 9.3 K/uL — ABNORMAL HIGH (ref 1.7–7.7)
Neutrophils Relative %: 70 %
Platelets: 444 K/uL — ABNORMAL HIGH (ref 150–400)
RBC: 1.66 MIL/uL — ABNORMAL LOW (ref 3.87–5.11)
RDW: 27.9 % — ABNORMAL HIGH (ref 11.5–15.5)
Smear Review: NORMAL
WBC: 13.1 K/uL — ABNORMAL HIGH (ref 4.0–10.5)
nRBC: 25.4 % — ABNORMAL HIGH (ref 0.0–0.2)

## 2024-01-12 LAB — URINE CULTURE

## 2024-01-12 LAB — BASIC METABOLIC PANEL WITH GFR
Anion gap: 9 (ref 5–15)
BUN: 14 mg/dL (ref 6–20)
CO2: 21 mmol/L — ABNORMAL LOW (ref 22–32)
Calcium: 9.2 mg/dL (ref 8.9–10.3)
Chloride: 108 mmol/L (ref 98–111)
Creatinine, Ser: 0.54 mg/dL (ref 0.44–1.00)
GFR, Estimated: >60 mL/min (ref >60)
Glucose, Bld: 87 mg/dL (ref 70–99)
Potassium: 3.7 mmol/L (ref 3.5–5.1)
Sodium: 138 mmol/L (ref 135–145)

## 2024-01-12 MED ORDER — ACETAMINOPHEN 650 MG RE SUPP: 650.0000 mg | Freq: Four times a day (QID) | RECTAL | Status: DC | PRN

## 2024-01-12 MED ORDER — GABAPENTIN 300 MG PO CAPS
600.0000 mg | ORAL_CAPSULE | Freq: Three times a day (TID) | ORAL | Status: DC
  Administered 2024-01-12 – 2024-01-24 (×35): 600 mg via ORAL
  Filled 2024-01-12 (×35): qty 2

## 2024-01-12 MED ORDER — HYDROMORPHONE HCL 1 MG/ML IJ SOLN: 1.0000 mg | Freq: Once | INTRAMUSCULAR | Status: DC

## 2024-01-12 MED ORDER — MUPIROCIN 2 % EX OINT
1.0000 | TOPICAL_OINTMENT | Freq: Every day | CUTANEOUS | Status: DC
  Administered 2024-01-13 – 2024-01-16 (×2): 1 via TOPICAL
  Filled 2024-01-12 (×6): qty 22

## 2024-01-12 MED ORDER — SODIUM CHLORIDE 0.9 % IV SOLN
2.0000 g | Freq: Once | INTRAVENOUS | Status: AC
  Administered 2024-01-12: 2 g via INTRAVENOUS
  Filled 2024-01-12: qty 20

## 2024-01-12 MED ORDER — SODIUM CHLORIDE 0.9 % IV SOLN: INTRAVENOUS | Status: DC

## 2024-01-12 MED ORDER — OXYCODONE HCL 5 MG PO TABS
10.0000 mg | ORAL_TABLET | ORAL | Status: DC | PRN
  Administered 2024-01-13 – 2024-01-24 (×30): 10 mg via ORAL
  Filled 2024-01-12 (×30): qty 2

## 2024-01-12 MED ORDER — GENTAMICIN SULFATE 0.1 % EX OINT
1.0000 | TOPICAL_OINTMENT | Freq: Every day | CUTANEOUS | Status: DC
  Filled 2024-01-12: qty 15

## 2024-01-12 MED ORDER — FOLIC ACID 1 MG PO TABS
1.0000 mg | ORAL_TABLET | Freq: Every day | ORAL | Status: DC
  Administered 2024-01-12 – 2024-01-24 (×13): 1 mg via ORAL
  Filled 2024-01-12 (×13): qty 1

## 2024-01-12 MED ORDER — OXYCODONE-ACETAMINOPHEN 5-325 MG PO TABS
2.0000 | ORAL_TABLET | Freq: Once | ORAL | Status: AC
  Administered 2024-01-12: 2 via ORAL
  Filled 2024-01-12: qty 2

## 2024-01-12 MED ORDER — ASPIRIN 81 MG PO TBEC
81.0000 mg | DELAYED_RELEASE_TABLET | Freq: Every day | ORAL | Status: DC
  Administered 2024-01-12 – 2024-01-24 (×13): 81 mg via ORAL
  Filled 2024-01-12 (×13): qty 1

## 2024-01-12 MED ORDER — PENTOXIFYLLINE ER 400 MG PO TBCR
400.0000 mg | EXTENDED_RELEASE_TABLET | Freq: Two times a day (BID) | ORAL | Status: DC
  Administered 2024-01-12 – 2024-01-24 (×24): 400 mg via ORAL
  Filled 2024-01-12 (×25): qty 1

## 2024-01-12 MED ORDER — POLYETHYLENE GLYCOL 3350 17 GM/SCOOP PO POWD: 17.0000 g | Freq: Two times a day (BID) | ORAL | Status: DC | PRN

## 2024-01-12 MED ORDER — HYDROMORPHONE HCL 1 MG/ML IJ SOLN
0.5000 mg | Freq: Four times a day (QID) | INTRAMUSCULAR | Status: DC | PRN
  Administered 2024-01-13 (×2): 0.5 mg via INTRAVENOUS
  Filled 2024-01-12 (×2): qty 0.5

## 2024-01-12 MED ORDER — HYDROMORPHONE HCL 1 MG/ML IJ SOLN
0.5000 mg | Freq: Once | INTRAMUSCULAR | Status: AC
  Administered 2024-01-12: 0.5 mg via INTRAVENOUS
  Filled 2024-01-12: qty 0.5

## 2024-01-12 MED ORDER — ENSURE PLUS HIGH PROTEIN PO LIQD
237.0000 mL | Freq: Three times a day (TID) | ORAL | Status: DC
  Administered 2024-01-12 – 2024-01-24 (×27): 237 mL via ORAL
  Filled 2024-01-12: qty 237

## 2024-01-12 MED ORDER — ADULT MULTIVITAMIN W/MINERALS CH
1.0000 | ORAL_TABLET | Freq: Every day | ORAL | Status: DC
  Administered 2024-01-13 – 2024-01-24 (×12): 1 via ORAL
  Filled 2024-01-12 (×15): qty 1

## 2024-01-12 MED ORDER — ACETAMINOPHEN 325 MG PO TABS
650.0000 mg | ORAL_TABLET | Freq: Four times a day (QID) | ORAL | Status: DC | PRN
  Administered 2024-01-16: 650 mg via ORAL
  Filled 2024-01-12: qty 2

## 2024-01-12 MED ORDER — ONDANSETRON HCL 4 MG/2ML IJ SOLN
4.0000 mg | Freq: Four times a day (QID) | INTRAMUSCULAR | Status: DC | PRN
  Administered 2024-01-15 – 2024-01-24 (×7): 4 mg via INTRAVENOUS
  Filled 2024-01-12 (×7): qty 2

## 2024-01-12 MED ORDER — ONDANSETRON HCL 4 MG PO TABS
4.0000 mg | ORAL_TABLET | Freq: Four times a day (QID) | ORAL | Status: DC | PRN
  Administered 2024-01-13: 4 mg via ORAL
  Filled 2024-01-12: qty 1

## 2024-01-12 MED ORDER — GENTAMICIN SULFATE 0.1 % EX CREA
TOPICAL_CREAM | Freq: Every day | CUTANEOUS | Status: DC
  Filled 2024-01-12: qty 15

## 2024-01-12 MED ORDER — SODIUM CHLORIDE 0.9% IV SOLUTION: Freq: Once | INTRAVENOUS | Status: AC

## 2024-01-12 NOTE — H&P (Addendum)
 History and Physical  [name] FMW:[id number] DOB: 20-Jan-1971 DOA: 01/12/2024  PCP: Paseda, Folashade R, FNP   Chief Complaint: Abnormal labs  HPI: [name] is a 53 y.o. female with medical history significant for sickle cell disease, anemia, chronic pain syndrome, chronic leg wounds, vitamin D  deficiency, and avascular necrosis who was advised by PCP to present to the ED due to low hemoglobin.  Patient reports she has had dizziness described as lightheadedness over the last week. She also endorsed associated fatigue, nausea and vomiting. She was evaluated by her PCP on 8/22 and found to have hemoglobin of 5.6.  She was scheduled for blood transfusion but not until this coming Thursday. Due to her worsening symptoms, she was advised to present to the ED for urgent blood transfusion. She endorsed chronic lower extremity pain from her chronic leg wounds and some chills but denies any shortness of breath, chest pain, fever, joint pains, palpitations, abdominal pain, bloody stools, dysuria or hematuria.  States she has had drop in her hemoglobin before but she is usually asymptomatic from them.  ED Course: Initial vitals show patient afebrile and normotensive, SpO2 95% on 2 L [place]. Initial labs significant for Hgb 4.8, WBC 13.1, platelet 444, normal kidney function. EKG shows sinus rhythm with nonspecific T wave abnormalities. Pt received IV NS 125 cc/h, IV Dilaudid , Percocet and 2 units of PRBC. TRH was consulted for admission.  Review of Systems: Please see HPI for pertinent positives and negatives. A complete 10 system review of systems are otherwise negative.  Past Medical History:  Diagnosis Date   Anemia 03/30/2012   Hx of sickle cell disease   CAP (community acquired pneumonia)    2014   Cholelithiasis 03/30/2012   Chronic wound of extremity 04/01/2012   Elevated LFTs    Leg ulcer (HCC) 10/27/2012   Chronic under care of wound clinic   Multiple open wounds of lower extremity     chronic wounds B/LLE   Peripheral arterial disease (HCC)    Sickle cell disease (HCC)    Sinus bradycardia by electrocardiogram 04/03/2012   Past Surgical History:  Procedure Laterality Date   ALLOGRAFT APPLICATION Right 02/14/2015   Procedure: SURGICAL PREP FOR GRAFTING RIGHT LOWER EXTREMITY AND APPLICATION OF THERASKIN;  Surgeon: Earlis Ranks, MD;  Location: [facility];  Service: Plastics;  Laterality: Right;   APPLICATION OF A-CELL OF EXTREMITY Right 10/09/2015   Procedure: APPLICATION OF THERASKIN;  Surgeon: Earlis Ranks, MD;  Location: [facility];  Service: Plastics;  Laterality: Right;   BREAST SURGERY     left breast cyst aspiration   CESAREAN SECTION     CESAREAN SECTION N/A 01/06/2014   Procedure: CESAREAN SECTION;  Surgeon: Harland JAYSON Birkenhead, MD;  Location: [facility];  Service: Obstetrics;  Laterality: N/A;   CESAREAN SECTION N/A 05/04/2017   Procedure: CESAREAN SECTION;  Surgeon: Eveline Lynwood MATSU, MD;  Location: [facility];  Service: Obstetrics;  Laterality: N/A;   I & D EXTREMITY Right 10/09/2015   Procedure: SURGICAL PREPARATION FOR GRAFTING RIGHT ANKLE AND APPLICATION THERASKIN;  Surgeon: Earlis Ranks, MD;  Location: [facility];  Service: Plastics;  Laterality: Right;   SKIN FULL THICKNESS GRAFT Bilateral 06/17/2016   Procedure: SURGICAL PREP FOR GRAFTING, BILATERAL LOWER EXTREMITIES AND APPLICATION OF THERASKIN;  Surgeon: Earlis Ranks, MD;  Location: [facility];  Service: Plastics;  Laterality: Bilateral;   TONSILLECTOMY     Social History:  reports that she has never smoked. She has  never been exposed to tobacco smoke. She has never used smokeless tobacco. She reports that she does not drink alcohol and does not use drugs.  No Known Allergies  Family History  Problem Relation Age of Onset   Sickle cell anemia Sister    Diabetes Mother    Asthma Mother    Sickle cell trait Mother    Sickle cell trait Father     Hypertension Father      Prior to Admission medications   Medication Sig Start Date End Date Taking? Authorizing Provider  ASPIRIN  LOW DOSE 81 MG tablet TAKE 1 TABLET(81 MG) BY MOUTH DAILY Patient taking differently: Take 81 mg by mouth daily. 05/29/23   Paseda, Folashade R, FNP  Deferiprone  500 MG TABS Take 500 mg by mouth daily at 12 noon. Patient not taking: Reported on 01/09/2024 04/19/21   Tilford Bertram HERO, FNP  folic acid  (FOLVITE ) 1 MG tablet Take 1 tablet (1 mg total) by mouth daily. 07/08/23   Paseda, Folashade R, FNP  gabapentin  (NEURONTIN ) 600 MG tablet TAKE 1 TABLET(600 MG) BY MOUTH THREE TIMES DAILY 12/29/23   Paseda, Folashade R, FNP  ibuprofen  (ADVIL ) 600 MG tablet TAKE 1 TABLET(600 MG) BY MOUTH EVERY 8 HOURS AS NEEDED Patient taking differently: Take 600 mg by mouth every 8 (eight) hours as needed. 12/03/23   Paseda, Folashade R, FNP  Nutritional Supplements (ENSURE HIGH PROTEIN) LIQD Take 1 each by mouth in the morning, at noon, and at bedtime. 11/03/23   Paseda, Folashade R, FNP  Nutritional Supplements (JUVEN) POWD Take 1 each by mouth 3 (three) times daily between meals. 07/27/21   Tilford Bertram HERO, FNP  Oxycodone  HCl 10 MG TABS Take 1 tablet (10 mg total) by mouth every 4 (four) hours as needed. 01/02/24   Paseda, Folashade R, FNP  polyethylene glycol powder (GLYCOLAX /MIRALAX ) 17 GM/SCOOP powder TAKE 17GRAMS MIXED IN FLUID BY MOUTH 2(TWO) TIMES DAILY AS NEEDED Patient taking differently: Take 17 g by mouth 2 (two) times daily as needed for mild constipation or moderate constipation. 12/03/23   Paseda, Folashade R, FNP  sulfamethoxazole -trimethoprim  (BACTRIM  DS) 800-160 MG tablet Take 1 tablet by mouth 2 (two) times daily. 01/09/24   Paseda, Folashade R, FNP  Vitamin D , Ergocalciferol , (DRISDOL ) 1.25 MG (50000 UNIT) CAPS capsule TAKE 1 CAPSULE BY MOUTH EVERY 7 DAYS 10/16/23   Paseda, Folashade R, FNP    Physical Exam: BP 104/60   Pulse 63   Temp 98.2 F (36.8 C) (Oral)   Resp 14    SpO2 95%  General: Pleasant, chronically ill middle-age woman laying in bed. No acute distress. HEENT: [place]/AT. Anicteric sclera. Slightly pale conjunctiva. CV: RRR. No murmurs, rubs, or gallops. No LE edema Pulmonary: On 2 LNC. Lungs CTAB. Normal effort. No wheezing or rales. Abdominal: Soft, nontender, nondistended. Normal bowel sounds. Extremities: Palpable radial and DP pulses. Normal ROM. Skin: Warm and dry. BLE wounds covered with dressing Neuro: A&Ox3. Moves all extremities. Normal sensation to light touch. No focal deficit. Psych: Normal mood and affect          Labs on Admission:  Basic Metabolic Panel: Recent Labs  Lab 01/09/24 1413 01/12/24 1754  NA 139 138  K 4.6 3.7  CL 106 108  CO2 20 21*  GLUCOSE 100* 87  BUN 10 14  CREATININE 0.61 0.54  CALCIUM  9.1 9.2   Liver Function Tests: Recent Labs  Lab 01/09/24 1413  AST 46*  ALT 19  ALKPHOS 93  BILITOT 2.3*  PROT  7.4  ALBUMIN 4.0   No results for input(s): LIPASE, AMYLASE in the last 168 hours. No results for input(s): AMMONIA in the last 168 hours. CBC: Recent Labs  Lab 01/09/24 1413 01/12/24 1754  WBC 16.3* 13.1*  NEUTROABS 10.7* 9.3*  HGB 5.6* 4.8*  HCT 16.6* 14.2*  MCV 89 85.5  PLT 490* 444*   Cardiac Enzymes: No results for input(s): CKTOTAL, CKMB, CKMBINDEX, TROPONINI in the last 168 hours. BNP (last 3 results) No results for input(s): BNP in the last 8760 hours.  ProBNP (last 3 results) No results for input(s): PROBNP in the last 8760 hours.  CBG: No results for input(s): GLUCAP in the last 168 hours.  Radiological Exams on Admission: No results found. Assessment/Plan Betta Balla is a 53 y.o. female with medical history significant for sickle cell disease, anemia, chronic pain syndrome, chronic wounds, vitamin D  deficiency, and avascular necrosis who was advised by PCP to present to the ED due to low hemoglobin and admitted for symptomatic anemia.   #  Symptomatic anemia # Acute on chronic anemia - Hgb of 4.8 on admission from baseline of 7-8 - Presented with worsening dizziness, fatigue, nausea and vomiting - This is in the setting of patient's sickle cell, no signs of active bleed - Continue 2 units PRBC transfusion - Check iron  studies, ferritin and vitamin B12 - Trend CBC and transfuse for Hgb goal > 7  # Acute hypoxic respiratory failure - Patient found to have brief drop in oxygen  to the high 80s in the ED - Patient denies any shortness of breath, cough or chest pain - Hypoxia likely secondary to severe anemia - Placed on 3 L [place], weaned down to 2 L - Continue supplemental O2, wean as able - Pulse ox with ambulation tomorrow  # Acute cystitis - Patient diagnosed with UTI at PCP office on 8/22 - Prescribed 3 days of Bactrim  however unable to complete treatment due to nausea and vomiting - Urine culture shows pansensitive E. coli - Give IV Rocephin   x 1 dose  # Sickle cell disease - Chronic and stable, no signs of sickle cell pain crisis - Continue folic acid , ASA and multivitamin - Trend CBC  # Chronic pain syndrome - Secondary to sickle cell and chronic lower extremity wounds - Continue home gabapentin  and as needed oxycodone  - IV Dilaudid  as needed for breakthrough pain  # Chronic lower extremity wounds # Chronic lower extremity pain - History of leg ulcers status post multiple surgeries over the last 10 years - Followed by wound care in the outpatient, last visit on 8/21 - Continue gentamicin  and mupirocin  ointments - Continue pentoxifylline   # Vitamin D  deficiency - Currently on high-dose vitamin D  supplementation - Last vitamin D  level was 21 4 months ago - Check repeat vitamin D  levels  DVT prophylaxis: N/A    Code Status: Full Code  Consults called: None  Family Communication: No family at bedside  Severity of Illness: The appropriate patient status for this patient is OBSERVATION. Observation status  is judged to be reasonable and necessary in order to provide the required intensity of service to ensure the patient's safety. The patient's presenting symptoms, physical exam findings, and initial radiographic and laboratory data in the context of their medical condition is felt to place them at decreased risk for further clinical deterioration. Furthermore, it is anticipated that the patient will be medically stable for discharge from the hospital within 2 midnights of admission.   Level of care: Telemetry  This record has been created using Conservation officer, historic buildings. Errors have been sought and corrected, but may not always be located. Such creation errors do not reflect on the standard of care.   Lou Claretta HERO, MD 01/12/2024, 8:05 PM [facility] Pager: [phone] Isaiah 41:10   If 7PM-7AM, please contact night-coverage [email or web address] Password [email or web address]

## 2024-01-12 NOTE — Plan of Care (Signed)
   Problem: Education: Goal: Knowledge of General Education information will improve Description: Including pain rating scale, medication(s)/side effects and non-pharmacologic comfort measures Outcome: Progressing   Problem: Activity: Goal: Risk for activity intolerance will decrease Outcome: Progressing   Problem: Coping: Goal: Level of anxiety will decrease Outcome: Progressing

## 2024-01-12 NOTE — ED Provider Notes (Signed)
 [place] EMERGENCY DEPARTMENT AT [facility] Provider Note   CSN: [id number] Arrival date & time: 01/12/24  1706     Patient presents with: abnormal labs   [name] is a 53 y.o. female.   53 year old female with history of sickle cell disease presents with low hemoglobin.  States that she has been weak for several days.  Old records reviewed and show that patient had hemoglobin 3 days ago which is 5.5.  Patient states that her normal hemoglobin is around 8.  She denies any history of blood loss.  No heavy periods.  No GI blood loss.  Has been compliant with her folic acid .  Denies any sickle cell pain exacerbation symptoms.  Her doctor and was scheduled to have a very blood transfusion in 3 days but could not wait due to increased weakness.  Has had this before in the past and she has received blood transfusions in clinic       Prior to Admission medications   Medication Sig Start Date End Date Taking? Authorizing Provider  ASPIRIN  LOW DOSE 81 MG tablet TAKE 1 TABLET(81 MG) BY MOUTH DAILY 05/29/23   Paseda, Folashade R, FNP  Deferiprone  500 MG TABS Take 500 mg by mouth daily at 12 noon. Patient not taking: Reported on 01/09/2024 04/19/21   Tilford Bertram HERO, FNP  folic acid  (FOLVITE ) 1 MG tablet Take 1 tablet (1 mg total) by mouth daily. 07/08/23   Paseda, Folashade R, FNP  gabapentin  (NEURONTIN ) 600 MG tablet TAKE 1 TABLET(600 MG) BY MOUTH THREE TIMES DAILY 12/29/23   Paseda, Folashade R, FNP  ibuprofen  (ADVIL ) 600 MG tablet TAKE 1 TABLET(600 MG) BY MOUTH EVERY 8 HOURS AS NEEDED 12/03/23   Paseda, Folashade R, FNP  Nutritional Supplements (ENSURE HIGH PROTEIN) LIQD Take 1 each by mouth in the morning, at noon, and at bedtime. 11/03/23   Paseda, Folashade R, FNP  Nutritional Supplements (JUVEN) POWD Take 1 each by mouth 3 (three) times daily between meals. 07/27/21   Tilford Bertram HERO, FNP  Oxycodone  HCl 10 MG TABS Take 1 tablet (10 mg total) by mouth every 4 (four) hours as  needed. 01/02/24   Paseda, Folashade R, FNP  polyethylene glycol powder (GLYCOLAX /MIRALAX ) 17 GM/SCOOP powder TAKE 17GRAMS MIXED IN FLUID BY MOUTH 2(TWO) TIMES DAILY AS NEEDED 12/03/23   Paseda, Folashade R, FNP  sulfamethoxazole -trimethoprim  (BACTRIM  DS) 800-160 MG tablet Take 1 tablet by mouth 2 (two) times daily. 01/09/24   Paseda, Folashade R, FNP  Vitamin D , Ergocalciferol , (DRISDOL ) 1.25 MG (50000 UNIT) CAPS capsule TAKE 1 CAPSULE BY MOUTH EVERY 7 DAYS 10/16/23   Paseda, Folashade R, FNP    Allergies: Patient has no known allergies.    Review of Systems  All other systems reviewed and are negative.   Updated Vital Signs BP (!) 121/59 (BP Location: Left Arm)   Pulse 72   Temp 98.4 F (36.9 C) (Oral)   Resp 14   SpO2 95%   Physical Exam Vitals and nursing note reviewed.  Constitutional:      General: She is not in acute distress.    Appearance: Normal appearance. She is well-developed. She is not toxic-appearing.  HENT:     Head: Normocephalic and atraumatic.  Eyes:     General: Lids are normal.     Conjunctiva/sclera: Conjunctivae normal.     Pupils: Pupils are equal, round, and reactive to light.  Neck:     Thyroid: No thyroid mass.     Trachea: No  tracheal deviation.  Cardiovascular:     Rate and Rhythm: Normal rate and regular rhythm.     Heart sounds: Normal heart sounds. No murmur heard.    No gallop.  Pulmonary:     Effort: Pulmonary effort is normal. No respiratory distress.     Breath sounds: Normal breath sounds. No stridor. No decreased breath sounds, wheezing, rhonchi or rales.  Abdominal:     General: There is no distension.     Palpations: Abdomen is soft.     Tenderness: There is no abdominal tenderness. There is no rebound.  Musculoskeletal:        General: No tenderness. Normal range of motion.     Cervical back: Normal range of motion and neck supple.  Lymphadenopathy:     Comments: Lower extremities wrapped bilateral.  Neurovascular intact to both  feet  Skin:    General: Skin is warm and dry.     Findings: No abrasion or rash.  Neurological:     Mental Status: She is alert and oriented to person, place, and time. Mental status is at baseline.     GCS: GCS eye subscore is 4. GCS verbal subscore is 5. GCS motor subscore is 6.     Cranial Nerves: No cranial nerve deficit.     Sensory: No sensory deficit.     Motor: Motor function is intact.  Psychiatric:        Attention and Perception: Attention normal.        Speech: Speech normal.        Behavior: Behavior normal.     (all labs ordered are listed, but only abnormal results are displayed) Labs Reviewed  CBC WITH DIFFERENTIAL/PLATELET  BASIC METABOLIC PANEL WITH GFR  TYPE AND SCREEN    EKG: EKG Interpretation Date/Time:  Monday January 12 2024 17:19:37 EDT Ventricular Rate:  75 PR Interval:  147 QRS Duration:  92 QT Interval:  418 QTC Calculation: 467 R Axis:   59  Text Interpretation: Sinus rhythm Borderline T abnormalities, diffuse leads Confirmed by Dasie Faden ([id number]) on 01/12/2024 6:27:39 PM  Radiology: No results found.   Procedures   Medications Ordered in the ED  0.9 %  sodium chloride  infusion (has no administration in time range)                                    Medical Decision Making Amount and/or Complexity of Data Reviewed Labs: ordered.  Risk Prescription drug management.   Patient is EKG shows normal sinus rhythm.  Hemoglobin here is 4.8.  2 units of packed red blood cells ordered.  Suspect this is from her underlying sickle cell disease.  Will consult hospitalist for admission     Final diagnoses:  None    ED Discharge Orders     None          Dasie Faden, MD 01/12/24 1902

## 2024-01-12 NOTE — ED Notes (Signed)
 US  PIV placed.

## 2024-01-12 NOTE — ED Notes (Signed)
 IV placement attempted, however unsuccessful. IV Ultrasound team contacted.

## 2024-01-12 NOTE — Telephone Encounter (Signed)
 Called patient to schedule her for blood transfusion per Lorice, FNP . Patient's Hemoglobin 5.6. First available appointment for blood transfusion will be Thursday, 8/28. Patient reports that she has been having dizziness, nausea and vomiting. Advised patient that she should go to the ED for blood transfusion since she is symptomatic and her appointment will not be until Thursday. Patient reports that she is unable to go to the ED right now due to child care responsibilities and wants to try and wait until Thursday to receive her blood.  Per patient, she will go to the ED before Thursday if she starts to feel worse.

## 2024-01-12 NOTE — Telephone Encounter (Signed)
 Patient called back to request which ED she can go to. Reviewed message from A. Millner, RN and recommended to go to ED closest to her and patient reports she has been to [facility] in the past. Recommended patient to go back to [facility] since her records will be up to date and sx of dizziness, nausea and vomiting continue to occur. Patient verbalized she will go to ED.

## 2024-01-12 NOTE — ED Triage Notes (Signed)
 Pt was sent over by her PCP. States her hemoglobin was 5.5. Pt states she is feeling dizzy, has been vomiting since yesterday afternoon. Pt has ulcers on her legs and is having pain with those.

## 2024-01-12 NOTE — Telephone Encounter (Signed)
 Copied from CRM #[id number]. Topic: Clinical - Red Word Triage >> Jan 12, 2024  3:37 PM Tobias L wrote: Red Word that prompted transfer to Nurse Triage: nausea, dizziness & vomiting This encounter was created in error - please disregard.   See previous encounter and advised ED as recommended previously

## 2024-01-13 DIAGNOSIS — R112 Nausea with vomiting, unspecified: Secondary | ICD-10-CM | POA: Diagnosis present

## 2024-01-13 DIAGNOSIS — B9620 Unspecified Escherichia coli [E. coli] as the cause of diseases classified elsewhere: Secondary | ICD-10-CM | POA: Diagnosis present

## 2024-01-13 DIAGNOSIS — L97919 Non-pressure chronic ulcer of unspecified part of right lower leg with unspecified severity: Secondary | ICD-10-CM | POA: Diagnosis present

## 2024-01-13 DIAGNOSIS — Z832 Family history of diseases of the blood and blood-forming organs and certain disorders involving the immune mechanism: Secondary | ICD-10-CM | POA: Diagnosis not present

## 2024-01-13 DIAGNOSIS — Z8249 Family history of ischemic heart disease and other diseases of the circulatory system: Secondary | ICD-10-CM | POA: Diagnosis not present

## 2024-01-13 DIAGNOSIS — N3000 Acute cystitis without hematuria: Secondary | ICD-10-CM | POA: Diagnosis present

## 2024-01-13 DIAGNOSIS — G894 Chronic pain syndrome: Secondary | ICD-10-CM | POA: Diagnosis present

## 2024-01-13 DIAGNOSIS — L97929 Non-pressure chronic ulcer of unspecified part of left lower leg with unspecified severity: Secondary | ICD-10-CM | POA: Diagnosis present

## 2024-01-13 DIAGNOSIS — Z825 Family history of asthma and other chronic lower respiratory diseases: Secondary | ICD-10-CM | POA: Diagnosis not present

## 2024-01-13 DIAGNOSIS — Z79899 Other long term (current) drug therapy: Secondary | ICD-10-CM | POA: Diagnosis not present

## 2024-01-13 DIAGNOSIS — Z833 Family history of diabetes mellitus: Secondary | ICD-10-CM | POA: Diagnosis not present

## 2024-01-13 DIAGNOSIS — D638 Anemia in other chronic diseases classified elsewhere: Secondary | ICD-10-CM | POA: Diagnosis present

## 2024-01-13 DIAGNOSIS — M879 Osteonecrosis, unspecified: Secondary | ICD-10-CM | POA: Diagnosis present

## 2024-01-13 DIAGNOSIS — Z7982 Long term (current) use of aspirin: Secondary | ICD-10-CM | POA: Diagnosis not present

## 2024-01-13 DIAGNOSIS — I739 Peripheral vascular disease, unspecified: Secondary | ICD-10-CM | POA: Diagnosis present

## 2024-01-13 DIAGNOSIS — D649 Anemia, unspecified: Secondary | ICD-10-CM | POA: Diagnosis present

## 2024-01-13 DIAGNOSIS — J9601 Acute respiratory failure with hypoxia: Secondary | ICD-10-CM | POA: Diagnosis present

## 2024-01-13 DIAGNOSIS — E559 Vitamin D deficiency, unspecified: Secondary | ICD-10-CM | POA: Diagnosis present

## 2024-01-13 DIAGNOSIS — D5700 Hb-SS disease with crisis, unspecified: Secondary | ICD-10-CM | POA: Diagnosis present

## 2024-01-13 LAB — TOXASSURE FLEX 15, UR
6-ACETYLMORPHINE IA: NEGATIVE ng/mL
7-aminoclonazepam: NOT DETECTED ng/mg{creat}
AMPHETAMINES IA: NEGATIVE ng/mL
Alpha-hydroxyalprazolam: NOT DETECTED ng/mg{creat}
Alpha-hydroxymidazolam: NOT DETECTED ng/mg{creat}
Alpha-hydroxytriazolam: NOT DETECTED ng/mg{creat}
Alprazolam: NOT DETECTED ng/mg{creat}
BARBITURATES IA: NEGATIVE ng/mL
Buprenorphine: NOT DETECTED ng/mg{creat}
CANNABINOIDS IA: NEGATIVE ng/mL
COCAINE METABOLITE IA: NEGATIVE ng/mL
Clonazepam: NOT DETECTED ng/mg{creat}
Creatinine: 65 mg/dL (ref >=20)
Desalkylflurazepam: NOT DETECTED ng/mg{creat}
Desmethyldiazepam: NOT DETECTED ng/mg{creat}
Desmethylflunitrazepam: NOT DETECTED ng/mg{creat}
Diazepam: NOT DETECTED ng/mg{creat}
ETHYL ALCOHOL Enzymatic: NEGATIVE g/dL
Fentanyl: NOT DETECTED ng/mg{creat}
Flunitrazepam: NOT DETECTED ng/mg{creat}
Lorazepam: NOT DETECTED ng/mg{creat}
METHADONE IA: NEGATIVE ng/mL
METHADONE MTB IA: NEGATIVE ng/mL
Midazolam: NOT DETECTED ng/mg{creat}
Norbuprenorphine: NOT DETECTED ng/mg{creat}
Norfentanyl: NOT DETECTED ng/mg{creat}
Oxazepam: NOT DETECTED ng/mg{creat}
PHENCYCLIDINE IA: NEGATIVE ng/mL
TAPENTADOL, IA: NEGATIVE ng/mL
TRAMADOL IA: NEGATIVE ng/mL
Temazepam: NOT DETECTED ng/mg{creat}

## 2024-01-13 LAB — OPIATE CLASS, MS, UR RFX
Codeine: NOT DETECTED ng/mg{creat}
Dihydrocodeine: NOT DETECTED ng/mg{creat}
Hydrocodone: NOT DETECTED ng/mg{creat}
Hydromorphone: NOT DETECTED ng/mg{creat}
Morphine: NOT DETECTED ng/mg{creat}
Norcodeine: NOT DETECTED ng/mg{creat}
Norhydrocodone: NOT DETECTED ng/mg{creat}
Normorphine: NOT DETECTED ng/mg{creat}
Opiate Class Confirmation: NEGATIVE

## 2024-01-13 LAB — FERRITIN: Ferritin: 1418 ng/mL — ABNORMAL HIGH (ref 11–307)

## 2024-01-13 LAB — IRON AND TIBC
Iron: 100 ug/dL (ref 28–170)
Saturation Ratios: 56 % — ABNORMAL HIGH (ref 10.4–31.8)
TIBC: 179 ug/dL — ABNORMAL LOW (ref 250–450)
UIBC: 79 ug/dL

## 2024-01-13 LAB — CBC
HCT: 22.5 % — ABNORMAL LOW (ref 36.0–46.0)
Hemoglobin: 7.7 g/dL — ABNORMAL LOW (ref 12.0–15.0)
MCH: 29.5 pg (ref 26.0–34.0)
MCHC: 34.2 g/dL (ref 30.0–36.0)
MCV: 86.2 fL (ref 80.0–100.0)
Platelets: 447 K/uL — ABNORMAL HIGH (ref 150–400)
RBC: 2.61 MIL/uL — ABNORMAL LOW (ref 3.87–5.11)
RDW: 21.5 % — ABNORMAL HIGH (ref 11.5–15.5)
WBC: 12.8 K/uL — ABNORMAL HIGH (ref 4.0–10.5)
nRBC: 22.3 % — ABNORMAL HIGH (ref 0.0–0.2)

## 2024-01-13 LAB — OXYCODONE CLASS, MS, UR RFX
Noroxycodone: 7285 ng/mg{creat}
Noroxymorphone: 1951 ng/mg{creat}
Oxycodone Class Confirmation: POSITIVE
Oxycodone: 4474 ng/mg{creat}
Oxymorphone: 8045 ng/mg{creat}

## 2024-01-13 LAB — BASIC METABOLIC PANEL WITH GFR
Anion gap: 7 (ref 5–15)
BUN: 10 mg/dL (ref 6–20)
CO2: 22 mmol/L (ref 22–32)
Calcium: 8.6 mg/dL — ABNORMAL LOW (ref 8.9–10.3)
Chloride: 110 mmol/L (ref 98–111)
Creatinine, Ser: 0.51 mg/dL (ref 0.44–1.00)
GFR, Estimated: >60 mL/min (ref >60)
Glucose, Bld: 94 mg/dL (ref 70–99)
Potassium: 3.6 mmol/L (ref 3.5–5.1)
Sodium: 139 mmol/L (ref 135–145)

## 2024-01-13 LAB — VITAMIN B12: Vitamin B-12: 880 pg/mL (ref 180–914)

## 2024-01-13 LAB — MRSA NEXT GEN BY PCR, NASAL: MRSA by PCR Next Gen: NOT DETECTED

## 2024-01-13 LAB — VITAMIN D 25 HYDROXY (VIT D DEFICIENCY, FRACTURES): Vit D, 25-Hydroxy: 65.43 ng/mL (ref 30–100)

## 2024-01-13 LAB — HIV ANTIBODY (ROUTINE TESTING W REFLEX): HIV Screen 4th Generation wRfx: NONREACTIVE

## 2024-01-13 MED ORDER — NALOXONE HCL 0.4 MG/ML IJ SOLN: 0.4000 mg | INTRAMUSCULAR | Status: DC | PRN

## 2024-01-13 MED ORDER — HYDROMORPHONE 1 MG/ML IV SOLN
INTRAVENOUS | Status: DC
  Administered 2024-01-13: 1.5 mg via INTRAVENOUS
  Administered 2024-01-13: 30 mg via INTRAVENOUS
  Administered 2024-01-13: 5.1 mg via INTRAVENOUS
  Administered 2024-01-13 – 2024-01-14 (×2): 2.7 mg via INTRAVENOUS
  Administered 2024-01-14: 0.9 mg via INTRAVENOUS
  Administered 2024-01-14: 4.8 mg via INTRAVENOUS
  Administered 2024-01-15: 30 mg via INTRAVENOUS
  Administered 2024-01-15: 3 mg via INTRAVENOUS
  Administered 2024-01-15: 3.6 mg via INTRAVENOUS
  Filled 2024-01-13 (×2): qty 30

## 2024-01-13 MED ORDER — SODIUM CHLORIDE 0.9% FLUSH: 9.0000 mL | INTRAVENOUS | Status: DC | PRN

## 2024-01-13 MED ORDER — SODIUM CHLORIDE 0.45 % IV SOLN: INTRAVENOUS | Status: AC

## 2024-01-13 MED ORDER — DIPHENHYDRAMINE HCL 25 MG PO CAPS
25.0000 mg | ORAL_CAPSULE | ORAL | Status: DC | PRN
  Administered 2024-01-18: 25 mg via ORAL
  Filled 2024-01-13 (×2): qty 1

## 2024-01-13 NOTE — Progress Notes (Signed)
   01/13/24 1558  TOC Brief Assessment  Insurance and Status Reviewed  Patient has primary care physician Yes (Paseda, Folashade R, FNP)  Home environment has been reviewed Home  Prior level of function: Independent  Prior/Current Home Services No current home services  Social Drivers of Health Review SDOH reviewed no interventions necessary  Readmission risk has been reviewed Yes  Transition of care needs no transition of care needs at this time

## 2024-01-13 NOTE — Plan of Care (Signed)
  Problem: Activity: Goal: Risk for activity intolerance will decrease Outcome: Progressing   Problem: Nutrition: Goal: Adequate nutrition will be maintained Outcome: Progressing   Problem: Pain Managment: Goal: General experience of comfort will improve and/or be controlled Outcome: Progressing   Problem: Skin Integrity: Goal: Risk for impaired skin integrity will decrease Outcome: Progressing   Problem: Sensory: Goal: Pain level will decrease with appropriate interventions Outcome: Progressing

## 2024-01-13 NOTE — Progress Notes (Signed)
 Patient ID: [name], female   DOB: 02-07-1971, 53 y.o.   MRN: [id number] Subjective: [name] is a 53 y.o. female with medical history significant for sickle cell disease, anemia, chronic pain syndrome, chronic leg wounds, vitamin D  deficiency, and avascular necrosis who was advised by PCP to present to the ED due to low hemoglobin.  Patient reports she has had dizziness described as lightheadedness over the last week. She also endorsed associated fatigue, nausea and vomiting.   Today patient reports generalize pain of 9/10. No new concerns. Denies fever, cough, SOB, N/V/D. No urinary symptoms  Objective:  Vital signs in last 24 hours:  Vitals:   01/13/24 0832 01/13/24 1022 01/13/24 1232 01/13/24 1350  BP: 114/66   105/61  Pulse: (!) 54   (!) 57  Resp: 20  15 20   Temp: 98 F (36.7 C)   98.4 F (36.9 C)  TempSrc: Oral   Oral  SpO2: 99%  98% 99%  Weight:  58.1 kg    Height:  5' 7 (1.702 m)      Intake/Output from previous day:   Intake/Output Summary (Last 24 hours) at 01/13/2024 1536 Last data filed at 01/13/2024 1500 Gross per 24 hour  Intake 1282.4 ml  Output --  Net 1282.4 ml    Physical Exam: General: Alert, awake, oriented x3, in no acute distress.  HEENT: /AT PEERL, EOMI Neck: Trachea midline,  no masses, no thyromegal,y no JVD, no carotid bruit OROPHARYNX:  Moist, No exudate/ erythema/lesions.  Heart: Regular rate and rhythm, without murmurs, rubs, gallops, PMI non-displaced, no heaves or thrills on palpation.  Lungs: Clear to auscultation, no wheezing or rhonchi noted. No increased vocal fremitus resonant to percussion  Abdomen: Soft, nontender, nondistended, positive bowel sounds, no masses no hepatosplenomegaly noted..  Neuro: No focal neurological deficits noted cranial nerves II through XII grossly intact. DTRs 2+ bilaterally upper and lower extremities. Strength 5 out of 5 in bilateral upper and lower extremities. Musculoskeletal: Generalize body  tenderness Psychiatric: Patient alert and oriented x3, good insight and cognition, good recent to remote recall. Lymph node survey: No cervical axillary or inguinal lymphadenopathy noted.  Lab Results:  Basic Metabolic Panel:    Component Value Date/Time   NA 139 01/13/2024 0418   NA 139 01/09/2024 1413   NA 139 01/20/2014 0758   K 3.6 01/13/2024 0418   K 3.9 01/20/2014 0758   CL 110 01/13/2024 0418   CO2 22 01/13/2024 0418   CO2 23 01/20/2014 0758   BUN 10 01/13/2024 0418   BUN 10 01/09/2024 1413   BUN 6.0 (L) 01/20/2014 0758   CREATININE 0.51 01/13/2024 0418   CREATININE 0.30 (L) 12/20/2016 0925   CREATININE 0.5 (L) 01/20/2014 0758   GLUCOSE 94 01/13/2024 0418   GLUCOSE 72 01/20/2014 0758   CALCIUM  8.6 (L) 01/13/2024 0418   CALCIUM  9.0 01/20/2014 0758   CBC:    Component Value Date/Time   WBC 12.8 (H) 01/13/2024 0418   HGB 7.7 (L) 01/13/2024 0418   HGB 5.6 (LL) 01/09/2024 1413   HGB 8.7 (L) 01/20/2014 0757   HCT 22.5 (L) 01/13/2024 0418   HCT 16.6 (LL) 01/09/2024 1413   HCT 25.2 (L) 01/20/2014 0757   PLT 447 (H) 01/13/2024 0418   PLT 490 (H) 01/09/2024 1413   MCV 86.2 01/13/2024 0418   MCV 89 01/09/2024 1413   MCV 84.3 01/20/2014 0757   NEUTROABS 9.3 (H) 01/12/2024 1754   NEUTROABS 10.7 (H) 01/09/2024 1413   NEUTROABS 8.7 (  H) 01/20/2014 0757   LYMPHSABS 1.8 01/12/2024 1754   LYMPHSABS 2.5 01/09/2024 1413   LYMPHSABS 2.7 01/20/2014 0757   MONOABS 1.5 (H) 01/12/2024 1754   MONOABS 1.9 (H) 01/20/2014 0757   EOSABS 0.1 01/12/2024 1754   EOSABS 0.8 (H) 01/09/2024 1413   BASOSABS 0.1 01/12/2024 1754   BASOSABS 0.2 01/09/2024 1413   BASOSABS 0.2 (H) 01/20/2014 0757    Recent Results (from the past 240 hours)  Urine Culture     Status: Abnormal   Collection Time: 01/09/24  2:41 PM   Specimen: Urine   UR  Result Value Ref Range Status   Urine Culture, Routine Final report (A)  Final   Organism ID, Bacteria Escherichia coli (A)  Final    Comment: Cefazolin   with an MIC <=16 predicts susceptibility to the oral agents cefaclor, cefdinir, cefpodoxime, cefprozil, cefuroxime, cephalexin, and loracarbef when used for therapy of uncomplicated urinary tract infections due to E. coli, Klebsiella pneumoniae, and Proteus mirabilis. Greater than 100,000 colony forming units per mL    Antimicrobial Susceptibility Comment  Final    Comment:       ** S = Susceptible; I = Intermediate; R = Resistant **                    P = Positive; N = Negative             MICS are expressed in micrograms per mL    Antibiotic                 RSLT#1    RSLT#2    RSLT#3    RSLT#4 Amoxicillin /Clavulanic Acid    S Ampicillin                      S Cefazolin                       S Cefepime                        S Cefoxitin                      S Cefpodoxime                    S Ceftriaxone                     S Ciprofloxacin                   S Ertapenem                      S Gentamicin                      S Levofloxacin                    S Meropenem                      S Nitrofurantoin                  S Piperacillin /Tazobactam        S Tetracycline                   S Tobramycin                     S Trimethoprim /Sulfa   S   MRSA Next Gen by PCR, Nasal     Status: None   Collection Time: 01/13/24 12:40 PM   Specimen: Nasal Mucosa; Nasal Swab  Result Value Ref Range Status   MRSA by PCR Next Gen NOT DETECTED NOT DETECTED Final    Comment: (NOTE) The GeneXpert MRSA Assay (FDA approved for NASAL specimens only), is one component of a comprehensive MRSA colonization surveillance program. It is not intended to diagnose MRSA infection nor to guide or monitor treatment for MRSA infections. Test performance is not FDA approved in patients less than 85 years old. Performed at [facility], [address] [place], [place] [address]     Studies/Results: No results found.  Medications: Scheduled Meds:  aspirin  EC  81 mg Oral Daily    feeding supplement  237 mL Oral TID   folic acid   1 mg Oral Daily   gabapentin   600 mg Oral TID   gentamicin  cream   Topical QHS   HYDROmorphone    Intravenous Q4H   multivitamin with minerals  1 tablet Oral Daily   mupirocin  ointment  1 Application Topical QHS   pentoxifylline   400 mg Oral BID   Continuous Infusions:  sodium chloride  10 mL/hr at 01/13/24 1017   PRN Meds:.acetaminophen  **OR** acetaminophen , diphenhydrAMINE , naloxone  **AND** sodium chloride  flush, ondansetron  **OR** ondansetron  (ZOFRAN ) IV, oxyCODONE , polyethylene glycol powder  Consultants: None  Procedures: Blood transfusion (completed)   Antibiotics: Bactrim   Assessment/Plan: Principal Problem:   Symptomatic anemia Active Problems:   Leukocytosis   Chronic pain syndrome   Acute hypoxic respiratory failure (HCC)   Acute cystitis without hematuria   Pain in both lower extremities   Sickle cell disease without crisis (HCC)   Acute on chronic anemia   Hb Sickle Cell Disease with Pain crisis: Continue IVF 0.45% Saline @ 75 mls/hour, continue weight based Dilaudid  PCA, IV Toradol  15 mg Q 6 H for a total of 5 days, continue oral home pain medications as ordered. Monitor vitals very closely, Re-evaluate pain scale regularly, 2 L of Oxygen  by [place]. Patient encouraged to ambulate on the hallway today.  Symptomatic anemia: Hgb of 4.8 on admission from baseline of 7-8. Transfuse 2 units PRBCs.  Leukocytosis: Elevated 12.8. urinalysis positive for UTI, currently on Bactrim  Anemia of Chronic Disease:  Hgb of 4.8 on admission from baseline of 7-8       Presented with worsening dizziness and fatigue. Completed 2 units of PRBCs    transfusion, HGB is 7.7 g/dl today. Will continue to monitor daily cbc.    Chronic pain Syndrome: Continue oral home pain medication  Acute Cystitis without hematuria: Urinalysis positive for E-coli: continue antibiotics as prescribed.   Code Status: Full Code Family Communication:  N/A Disposition Plan: Not yet ready for discharge  Homer CHRISTELLA Cover NP   If 7PM-7AM, please contact night-coverage.  01/13/2024, 3:36 PM  LOS: 0 days

## 2024-01-14 LAB — CBC
HCT: 22 % — ABNORMAL LOW (ref 36.0–46.0)
Hemoglobin: 7.4 g/dL — ABNORMAL LOW (ref 12.0–15.0)
MCH: 29.1 pg (ref 26.0–34.0)
MCHC: 33.6 g/dL (ref 30.0–36.0)
MCV: 86.6 fL (ref 80.0–100.0)
Platelets: 396 K/uL (ref 150–400)
RBC: 2.54 MIL/uL — ABNORMAL LOW (ref 3.87–5.11)
RDW: 20.8 % — ABNORMAL HIGH (ref 11.5–15.5)
WBC: 12.4 K/uL — ABNORMAL HIGH (ref 4.0–10.5)
nRBC: 10.6 % — ABNORMAL HIGH (ref 0.0–0.2)

## 2024-01-14 MED ORDER — MUPIROCIN 2 % EX OINT
TOPICAL_OINTMENT | Freq: Two times a day (BID) | CUTANEOUS | Status: DC
Start: 2024-01-14 — End: 2024-01-14

## 2024-01-14 MED ORDER — SULFAMETHOXAZOLE-TRIMETHOPRIM 800-160 MG PO TABS
1.0000 | ORAL_TABLET | Freq: Two times a day (BID) | ORAL | Status: AC
  Administered 2024-01-14 – 2024-01-18 (×10): 1 via ORAL
  Filled 2024-01-14 (×10): qty 1

## 2024-01-14 NOTE — Plan of Care (Signed)
  Problem: Pain Managment: Goal: General experience of comfort will improve and/or be controlled Outcome: Progressing   Problem: Safety: Goal: Ability to remain free from injury will improve Outcome: Progressing   Problem: Skin Integrity: Goal: Risk for impaired skin integrity will decrease Outcome: Progressing

## 2024-01-14 NOTE — Consult Note (Signed)
 Please note that the [facility] nursing team is utilizing a standardized work plan to manage patient consults. We are triaging consults and will try to see the patients within 48 hours. Wound photos in the patient's chart allow us  to consult on the patient in the most efficient and timely manner.    Daimian Sudberry [facility], CNS, CWON-AP [phone]

## 2024-01-14 NOTE — Progress Notes (Signed)
 This nurse notified the provider on call. Reason being this nurse tried to administer patients creams for her bedtime wound care, but the patient refused stating wound care was supposed to come and show you all how to do it. I cannot do it here because I do not have all the materials to do it. Patient was educated on not doing wound care with her cream for her leg wounds.

## 2024-01-14 NOTE — Consult Note (Addendum)
 WOC Nurse Consult Note: Reason for Consult:wound bilateral legs  Patient is followed by the wound care center, last seen by Dr. Rosan 01/08/24 Wounds medial and lateral bilateral ankles, related to sickle cell disease. Present greater than 2 years, painful  Wound type: circulatory; sickle cell  Pressure Injury POA: NA Measurement: see nursing flow sheets if patient allows dressing change Wound bed: see WCC notes and nursing flow sheets if patient allows dressing change Drainage (amount, consistency, odor) see nursing flow sheets Periwound:intact  Dressing procedure/placement/frequency: Cleanse LE wounds with Carolynn Soila # [phone]). Apply Gentamicin  and Bactroban  to the wounds, apply triamcinolone  to the periwound areas, apply moisturizer to remainder of the legs and feet (Sween cream from [employer] skin care),  cover with Vaseline gauze, top with ABD pads, secure with kerlix and ACE wrap from base of toes to the knees. Or per patient preference. Document refusal in nursing flow sheets.    Discussed case with Dr. Marolyn from the [facility], Dr. Jegede, and NP with sickle cell treatment team.  Orders updated per [facility] POC.    Admitting team to arrange any other consults while inpatient.   FU with WCC as scheduled.   Patient has been see by [facility] plastic, failed grafting, failed HBO tx.  Unfortunate situation for complicated wounds.    Re consult if needed, will not follow at this time. Thanks  Devell Parkerson [employer], RN,CWOCN, CNS, [employer] [phone]

## 2024-01-14 NOTE — Plan of Care (Signed)

## 2024-01-14 NOTE — Progress Notes (Signed)
 Patient ID: [name], female   DOB: November 08, 1970, 53 y.o.   MRN: [id number] Subjective: [name] is a 53 y.o. female with medical history significant for sickle cell disease, anemia, chronic pain syndrome, chronic leg wounds, vitamin D  deficiency, and avascular necrosis who was advised by PCP to present to the ED due to low hemoglobin.  Patient reports she has had dizziness described as lightheadedness over the last week. She also endorsed associated fatigue, nausea and vomiting.   Today patient reports generalize pain of 7/10. No new concerns. Denies fever, cough, SOB, N/V/D. No urinary symptoms  Objective:  Vital signs in last 24 hours:  Vitals:   01/14/24 0323 01/14/24 0726 01/14/24 1113 01/14/24 1247  BP:   111/64   Pulse:   (!) 59   Resp: 16 15 15 13   Temp:   98.4 F (36.9 C)   TempSrc:   Oral   SpO2: 98% 97% 98% 97%  Weight:      Height:        Intake/Output from previous day:   Intake/Output Summary (Last 24 hours) at 01/14/2024 1423 Last data filed at 01/13/2024 1500 Gross per 24 hour  Intake 48.9 ml  Output --  Net 48.9 ml    Physical Exam: General: Alert, awake, oriented x3, in no acute distress.  HEENT: [place]/AT PEERL, EOMI Neck: Trachea midline,  no masses, no thyromegal,y no JVD, no carotid bruit OROPHARYNX:  Moist, No exudate/ erythema/lesions.  Heart: Regular rate and rhythm, without murmurs, rubs, gallops, PMI non-displaced, no heaves or thrills on palpation.  Lungs: Clear to auscultation, no wheezing or rhonchi noted. No increased vocal fremitus resonant to percussion  Abdomen: Soft, nontender, nondistended, positive bowel sounds, no masses no hepatosplenomegaly noted..  Neuro: No focal neurological deficits noted cranial nerves II through XII grossly intact. DTRs 2+ bilaterally upper and lower extremities. Strength 5 out of 5 in bilateral upper and lower extremities. Musculoskeletal: Generalize body tenderness Psychiatric: Patient alert and oriented  x3, good insight and cognition, good recent to remote recall. Lymph node survey: No cervical axillary or inguinal lymphadenopathy noted.  Lab Results:  Basic Metabolic Panel:    Component Value Date/Time   NA 139 01/13/2024 0418   NA 139 01/09/2024 1413   NA 139 01/20/2014 0758   K 3.6 01/13/2024 0418   K 3.9 01/20/2014 0758   CL 110 01/13/2024 0418   CO2 22 01/13/2024 0418   CO2 23 01/20/2014 0758   BUN 10 01/13/2024 0418   BUN 10 01/09/2024 1413   BUN 6.0 (L) 01/20/2014 0758   CREATININE 0.51 01/13/2024 0418   CREATININE 0.30 (L) 12/20/2016 0925   CREATININE 0.5 (L) 01/20/2014 0758   GLUCOSE 94 01/13/2024 0418   GLUCOSE 72 01/20/2014 0758   CALCIUM  8.6 (L) 01/13/2024 0418   CALCIUM  9.0 01/20/2014 0758   CBC:    Component Value Date/Time   WBC 12.4 (H) 01/14/2024 1034   HGB 7.4 (L) 01/14/2024 1034   HGB 5.6 (LL) 01/09/2024 1413   HGB 8.7 (L) 01/20/2014 0757   HCT 22.0 (L) 01/14/2024 1034   HCT 16.6 (LL) 01/09/2024 1413   HCT 25.2 (L) 01/20/2014 0757   PLT 396 01/14/2024 1034   PLT 490 (H) 01/09/2024 1413   MCV 86.6 01/14/2024 1034   MCV 89 01/09/2024 1413   MCV 84.3 01/20/2014 0757   NEUTROABS 9.3 (H) 01/12/2024 1754   NEUTROABS 10.7 (H) 01/09/2024 1413   NEUTROABS 8.7 (H) 01/20/2014 0757   LYMPHSABS 1.8 01/12/2024 1754  LYMPHSABS 2.5 01/09/2024 1413   LYMPHSABS 2.7 01/20/2014 0757   MONOABS 1.5 (H) 01/12/2024 1754   MONOABS 1.9 (H) 01/20/2014 0757   EOSABS 0.1 01/12/2024 1754   EOSABS 0.8 (H) 01/09/2024 1413   BASOSABS 0.1 01/12/2024 1754   BASOSABS 0.2 01/09/2024 1413   BASOSABS 0.2 (H) 01/20/2014 0757    Recent Results (from the past 240 hours)  Urine Culture     Status: Abnormal   Collection Time: 01/09/24  2:41 PM   Specimen: Urine   UR  Result Value Ref Range Status   Urine Culture, Routine Final report (A)  Final   Organism ID, Bacteria Escherichia coli (A)  Final    Comment: Cefazolin  with an MIC <=16 predicts susceptibility to the oral  agents cefaclor, cefdinir, cefpodoxime, cefprozil, cefuroxime, cephalexin, and loracarbef when used for therapy of uncomplicated urinary tract infections due to E. coli, Klebsiella pneumoniae, and Proteus mirabilis. Greater than 100,000 colony forming units per mL    Antimicrobial Susceptibility Comment  Final    Comment:       ** S = Susceptible; I = Intermediate; R = Resistant **                    P = Positive; N = Negative             MICS are expressed in micrograms per mL    Antibiotic                 RSLT#1    RSLT#2    RSLT#3    RSLT#4 Amoxicillin /Clavulanic Acid    S Ampicillin                      S Cefazolin                       S Cefepime                        S Cefoxitin                      S Cefpodoxime                    S Ceftriaxone                     S Ciprofloxacin                   S Ertapenem                      S Gentamicin                      S Levofloxacin                    S Meropenem                      S Nitrofurantoin                  S Piperacillin /Tazobactam        S Tetracycline                   S Tobramycin                     S Trimethoprim /Sulfa   S   MRSA Next Gen by PCR, Nasal     Status: None   Collection Time: 01/13/24 12:40 PM   Specimen: Nasal Mucosa; Nasal Swab  Result Value Ref Range Status   MRSA by PCR Next Gen NOT DETECTED NOT DETECTED Final    Comment: (NOTE) The GeneXpert MRSA Assay (FDA approved for NASAL specimens only), is one component of a comprehensive MRSA colonization surveillance program. It is not intended to diagnose MRSA infection nor to guide or monitor treatment for MRSA infections. Test performance is not FDA approved in patients less than 17 years old. Performed at [facility], [address], [place], [place] [address]     Studies/Results: No results found.  Medications: Scheduled Meds:  aspirin  EC  81 mg Oral Daily   feeding supplement  237 mL Oral TID   folic acid   1  mg Oral Daily   gabapentin   600 mg Oral TID   gentamicin  cream   Topical QHS   HYDROmorphone    Intravenous Q4H   multivitamin with minerals  1 tablet Oral Daily   mupirocin  ointment  1 Application Topical QHS   pentoxifylline   400 mg Oral BID   sulfamethoxazole -trimethoprim   1 tablet Oral BID   Continuous Infusions:   PRN Meds:.acetaminophen  **OR** acetaminophen , diphenhydrAMINE , naloxone  **AND** sodium chloride  flush, ondansetron  **OR** ondansetron  (ZOFRAN ) IV, oxyCODONE , polyethylene glycol powder  Consultants: None  Procedures: Blood transfusion (completed)   Antibiotics: Bactrim   Assessment/Plan: Principal Problem:   Symptomatic anemia Active Problems:   Leukocytosis   Chronic pain syndrome   Bilateral leg ulcer (HCC)   Acute cystitis without hematuria   Pain in both lower extremities   Sickle cell disease without crisis (HCC)   Acute on chronic anemia   Hb Sickle Cell Disease with Pain crisis: Continue IVF 0.45% Saline @ KVO mls.  continue weight based Dilaudid  PCA, IV Toradol  15 mg Q 6 H for a total of 5 days, continue oral home pain medications as ordered. Monitor vitals very closely, Re-evaluate pain scale regularly, 2 L of Oxygen  by [place]. Patient encouraged to ambulate on the hallway today.  Symptomatic anemia: Hgb of 4.8 on admission from baseline of 7-8. Transfuse 2 units PRBCs.  Leukocytosis: Elevated 12.8. urinalysis positive for UTI, currently on Bactrim  Anemia of Chronic Disease:  Hgb of 4.8 on admission from baseline of 7-8       Presented with worsening dizziness and fatigue. Completed 2 units of PRBCs    transfusion, HGB is 7.7 g/dl today. Will continue to monitor daily cbc.    Chronic pain Syndrome: Continue oral home pain medication  Acute Cystitis without hematuria: Urinalysis positive for E-coli: continue antibiotics as prescribed.  Bilateral Leg ulcer: Chronic BLE ulcer. Wound care consult completed.   Code Status: Full Code Family Communication:  N/A Disposition Plan: Not yet ready for discharge  [name] Cover NP   If 7PM-7AM, please contact night-coverage.  01/14/2024, 2:23 PM  LOS: 1 day

## 2024-01-14 NOTE — Progress Notes (Signed)
 Dressing change to bilateral ankle wounds/ulcers offered to patient x3. Patient refuses.

## 2024-01-14 NOTE — Progress Notes (Signed)
 This nurse messaged provider/ wound team because pt is missing order for triamcinolone  cream for wound care.

## 2024-01-15 ENCOUNTER — Encounter (HOSPITAL_COMMUNITY)

## 2024-01-15 LAB — PREPARE RBC (CROSSMATCH)

## 2024-01-15 MED ORDER — HYDROMORPHONE HCL 2 MG/ML IJ SOLN
2.0000 mg | Freq: Once | INTRAMUSCULAR | Status: AC
  Administered 2024-01-15: 2 mg via INTRAVENOUS
  Filled 2024-01-15: qty 1

## 2024-01-15 MED ORDER — SODIUM CHLORIDE 0.9% IV SOLUTION: Freq: Once | INTRAVENOUS | Status: AC

## 2024-01-15 MED ORDER — HYDROMORPHONE 1 MG/ML IV SOLN
INTRAVENOUS | Status: DC
  Administered 2024-01-15: 3.6 mg via INTRAVENOUS
  Administered 2024-01-15: 1.5 mg via INTRAVENOUS
  Administered 2024-01-15 – 2024-01-16 (×2): 2.1 mg via INTRAVENOUS
  Administered 2024-01-16: 0.6 mg via INTRAVENOUS
  Administered 2024-01-16: 1.5 mg via INTRAVENOUS
  Administered 2024-01-16: 4.8 mg via INTRAVENOUS
  Administered 2024-01-16: 0.6 mg via INTRAVENOUS
  Administered 2024-01-16: 2.1 mg via INTRAVENOUS
  Administered 2024-01-17: 2.7 mg via INTRAVENOUS
  Administered 2024-01-17: 5.7 mg via INTRAVENOUS
  Administered 2024-01-17: 3.6 mg via INTRAVENOUS
  Administered 2024-01-17: 2.1 mg via INTRAVENOUS
  Filled 2024-01-15: qty 30

## 2024-01-15 MED ORDER — DIPHENHYDRAMINE HCL 25 MG PO CAPS
25.0000 mg | ORAL_CAPSULE | Freq: Once | ORAL | Status: AC
  Administered 2024-01-15: 25 mg via ORAL

## 2024-01-15 MED ORDER — ACETAMINOPHEN 325 MG PO TABS
650.0000 mg | ORAL_TABLET | Freq: Once | ORAL | Status: AC
  Administered 2024-01-15: 650 mg via ORAL
  Filled 2024-01-15: qty 2

## 2024-01-15 MED ORDER — TRIAMCINOLONE ACETONIDE 0.1 % EX OINT
TOPICAL_OINTMENT | CUTANEOUS | Status: DC
  Filled 2024-01-15 (×2): qty 80

## 2024-01-15 NOTE — Progress Notes (Addendum)
 Pt hgb on 01/14/2024 was 7.4. No new CBC order for today. Provider placed an order for therapeutic phlebotomy and 2units of PRBCs to follow. This nurse contacted provider for clarification on orders placed. No response from provider. Care continues.

## 2024-01-15 NOTE — Progress Notes (Signed)
 Dressing change successful on RLE. Pt asked to defer left leg until later. Pain medications to be given prior to dressing changes  Per provider pt will need an one time order of IV push dilaudid  prior to dressing change.

## 2024-01-15 NOTE — Plan of Care (Signed)
  Problem: Education: Goal: Knowledge of General Education information will improve Description: Including pain rating scale, medication(s)/side effects and non-pharmacologic comfort measures Outcome: Progressing   Problem: Pain Managment: Goal: General experience of comfort will improve and/or be controlled Outcome: Progressing   Problem: Skin Integrity: Goal: Risk for impaired skin integrity will decrease Outcome: Progressing   Problem: Education: Goal: Knowledge of vaso-occlusive preventative measures will improve Outcome: Progressing Goal: Awareness of infection prevention will improve Outcome: Progressing Goal: Awareness of signs and symptoms of anemia will improve Outcome: Progressing   Problem: Self-Care: Goal: Ability to incorporate actions that prevent/reduce pain crisis will improve Outcome: Progressing   Problem: Sensory: Goal: Pain level will decrease with appropriate interventions Outcome: Progressing

## 2024-01-15 NOTE — Progress Notes (Signed)
 Therapuetic phlebotomy per MD orders. Phlebotomy procedure started at 2235 and ended at 2300. 500 cc removed. Patient tolerated procedure well.

## 2024-01-15 NOTE — Progress Notes (Addendum)
 Patient ID: [name], female   DOB: June 27, 1970, 53 y.o.   MRN: [id number] Subjective: [name] is a 53 y.o. female with medical history significant for sickle cell disease, anemia, chronic pain syndrome, chronic leg wounds, vitamin D  deficiency, and avascular necrosis who was advised by PCP to present to the ED due to low hemoglobin.  Patient reports she has had dizziness described as lightheadedness over the last week. She also endorsed associated fatigue, nausea and vomiting.   Today patient reports generalize pain and worsening bilateral lower legs of 8/10. No new concerns. Denies fever, cough, SOB, N/V/D. No urinary symptoms  Objective:  Vital signs in last 24 hours:  Vitals:   01/15/24 0428 01/15/24 0725 01/15/24 1025 01/15/24 1210  BP: 126/60  113/67   Pulse: (!) 51  (!) 53   Resp: 15 13 14 13   Temp: 98.4 F (36.9 C)  98.1 F (36.7 C)   TempSrc: Oral  Oral   SpO2: 99% 99% 100% 98%  Weight:      Height:        Intake/Output from previous day:   Intake/Output Summary (Last 24 hours) at 01/15/2024 1538 Last data filed at 01/15/2024 0900 Gross per 24 hour  Intake 480 ml  Output --  Net 480 ml    Physical Exam: General: Alert, awake, oriented x3, in no acute distress.  HEENT: [place]/AT PEERL, EOMI Neck: Trachea midline,  no masses, no thyromegal,y no JVD, no carotid bruit OROPHARYNX:  Moist, No exudate/ erythema/lesions.  Heart: Regular rate and rhythm, without murmurs, rubs, gallops, PMI non-displaced, no heaves or thrills on palpation.  Lungs: Clear to auscultation, no wheezing or rhonchi noted. No increased vocal fremitus resonant to percussion  Abdomen: Soft, nontender, nondistended, positive bowel sounds, no masses no hepatosplenomegaly noted..  Neuro: No focal neurological deficits noted cranial nerves II through XII grossly intact. DTRs 2+ bilaterally upper and lower extremities. Strength 5 out of 5 in bilateral upper and lower extremities. Musculoskeletal:  Generalize body tenderness Psychiatric: Patient alert and oriented x3, good insight and cognition, good recent to remote recall. Lymph node survey: No cervical axillary or inguinal lymphadenopathy noted.  Lab Results:  Basic Metabolic Panel:    Component Value Date/Time   NA 139 01/13/2024 0418   NA 139 01/09/2024 1413   NA 139 01/20/2014 0758   K 3.6 01/13/2024 0418   K 3.9 01/20/2014 0758   CL 110 01/13/2024 0418   CO2 22 01/13/2024 0418   CO2 23 01/20/2014 0758   BUN 10 01/13/2024 0418   BUN 10 01/09/2024 1413   BUN 6.0 (L) 01/20/2014 0758   CREATININE 0.51 01/13/2024 0418   CREATININE 0.30 (L) 12/20/2016 0925   CREATININE 0.5 (L) 01/20/2014 0758   GLUCOSE 94 01/13/2024 0418   GLUCOSE 72 01/20/2014 0758   CALCIUM  8.6 (L) 01/13/2024 0418   CALCIUM  9.0 01/20/2014 0758   CBC:    Component Value Date/Time   WBC 12.4 (H) 01/14/2024 1034   HGB 7.4 (L) 01/14/2024 1034   HGB 5.6 (LL) 01/09/2024 1413   HGB 8.7 (L) 01/20/2014 0757   HCT 22.0 (L) 01/14/2024 1034   HCT 16.6 (LL) 01/09/2024 1413   HCT 25.2 (L) 01/20/2014 0757   PLT 396 01/14/2024 1034   PLT 490 (H) 01/09/2024 1413   MCV 86.6 01/14/2024 1034   MCV 89 01/09/2024 1413   MCV 84.3 01/20/2014 0757   NEUTROABS 9.3 (H) 01/12/2024 1754   NEUTROABS 10.7 (H) 01/09/2024 1413   NEUTROABS 8.7 (  H) 01/20/2014 0757   LYMPHSABS 1.8 01/12/2024 1754   LYMPHSABS 2.5 01/09/2024 1413   LYMPHSABS 2.7 01/20/2014 0757   MONOABS 1.5 (H) 01/12/2024 1754   MONOABS 1.9 (H) 01/20/2014 0757   EOSABS 0.1 01/12/2024 1754   EOSABS 0.8 (H) 01/09/2024 1413   BASOSABS 0.1 01/12/2024 1754   BASOSABS 0.2 01/09/2024 1413   BASOSABS 0.2 (H) 01/20/2014 0757    Recent Results (from the past 240 hours)  Urine Culture     Status: Abnormal   Collection Time: 01/09/24  2:41 PM   Specimen: Urine   UR  Result Value Ref Range Status   Urine Culture, Routine Final report (A)  Final   Organism ID, Bacteria Escherichia coli (A)  Final    Comment:  Cefazolin  with an MIC <=16 predicts susceptibility to the oral agents cefaclor, cefdinir, cefpodoxime, cefprozil, cefuroxime, cephalexin, and loracarbef when used for therapy of uncomplicated urinary tract infections due to E. coli, Klebsiella pneumoniae, and Proteus mirabilis. Greater than 100,000 colony forming units per mL    Antimicrobial Susceptibility Comment  Final    Comment:       ** S = Susceptible; I = Intermediate; R = Resistant **                    P = Positive; N = Negative             MICS are expressed in micrograms per mL    Antibiotic                 RSLT#1    RSLT#2    RSLT#3    RSLT#4 Amoxicillin /Clavulanic Acid    S Ampicillin                      S Cefazolin                       S Cefepime                        S Cefoxitin                      S Cefpodoxime                    S Ceftriaxone                     S Ciprofloxacin                   S Ertapenem                      S Gentamicin                      S Levofloxacin                    S Meropenem                      S Nitrofurantoin                  S Piperacillin /Tazobactam        S Tetracycline                   S Tobramycin                     S Trimethoprim /Sulfa   S   MRSA Next Gen by PCR, Nasal     Status: None   Collection Time: 01/13/24 12:40 PM   Specimen: Nasal Mucosa; Nasal Swab  Result Value Ref Range Status   MRSA by PCR Next Gen NOT DETECTED NOT DETECTED Final    Comment: (NOTE) The GeneXpert MRSA Assay (FDA approved for NASAL specimens only), is one component of a comprehensive MRSA colonization surveillance program. It is not intended to diagnose MRSA infection nor to guide or monitor treatment for MRSA infections. Test performance is not FDA approved in patients less than 68 years old. Performed at [facility], [address], [place], [place] [address]     Studies/Results: No results found.  Medications: Scheduled Meds:  aspirin  EC  81 mg Oral  Daily   feeding supplement  237 mL Oral TID   folic acid   1 mg Oral Daily   gabapentin   600 mg Oral TID   gentamicin  cream   Topical QHS   HYDROmorphone    Intravenous Q4H   multivitamin with minerals  1 tablet Oral Daily   mupirocin  ointment  1 Application Topical QHS   pentoxifylline   400 mg Oral BID   sulfamethoxazole -trimethoprim   1 tablet Oral BID   triamcinolone  ointment   Topical QODAY   Continuous Infusions:   PRN Meds:.acetaminophen  **OR** acetaminophen , diphenhydrAMINE , naloxone  **AND** sodium chloride  flush, ondansetron  **OR** ondansetron  (ZOFRAN ) IV, oxyCODONE , polyethylene glycol powder  Consultants: None  Procedures: Exchange transfusion.  Antibiotics: Bactrim   Assessment/Plan: Principal Problem:   Symptomatic anemia Active Problems:   Leukocytosis   Chronic pain syndrome   Bilateral leg ulcer (HCC)   Acute cystitis without hematuria   Pain in both lower extremities   Sickle cell disease without crisis (HCC)   Acute on chronic anemia   Hb Sickle Cell Disease with Pain crisis: Pain not well-controlled due to bilateral lower extremity leg ulcer.  PCA increased to 0 point 5/10/3.  As needed IV Dilaudid  ordered prior to wound care dressing.  Continue IVF 0.45% Saline @ KVO mls.  continue weight based Dilaudid  PCA, IV Toradol  15 mg Q 6 H for a total of 5 days, continue oral home pain medications as ordered. Monitor vitals very closely, Re-evaluate pain scale regularly, 2 L of Oxygen  by [place]. Patient encouraged to ambulate on the hallway today.  Symptomatic anemia: Hgb of 4.8 on admission from baseline of 7-8. Transfuse 2 units PRBCs.  Leukocytosis: Elevated 12.8. urinalysis positive for UTI, currently on Bactrim  Anemia of Chronic Disease:  Hgb of 4.8 on admission from baseline of 7-8       Presented with worsening dizziness and fatigue. Completed 2 units of PRBCs    transfusion, HGB is 7.4 g/dl today. Will continue to monitor daily cbc.   Exchange transfusion ordered  today.  500 cc out and transfusing 2 units packed red blood cells. Chronic pain Syndrome: Continue oral home pain medication  Acute Cystitis without hematuria: Urinalysis positive for E-coli: continue antibiotics as prescribed.  Bilateral Leg ulcer: Chronic BLE ulcer. Wound care consult completed.   Code Status: Full Code Family Communication: N/A Disposition Plan: Not yet ready for discharge  Homer CHRISTELLA Cover NP   If 7PM-7AM, please contact night-coverage.  01/15/2024, 3:38 PM  LOS: 2 days

## 2024-01-15 NOTE — Plan of Care (Signed)
   Problem: Activity: Goal: Risk for activity intolerance will decrease Outcome: Progressing   Problem: Nutrition: Goal: Adequate nutrition will be maintained Outcome: Progressing   Problem: Pain Managment: Goal: General experience of comfort will improve and/or be controlled Outcome: Progressing   Problem: Safety: Goal: Ability to remain free from injury will improve Outcome: Progressing

## 2024-01-15 NOTE — Progress Notes (Addendum)
 Order clarified by provider and phlebotomy and PRBCs to be administered to pt because of pts wounds and circulation.

## 2024-01-16 LAB — TYPE AND SCREEN
ABO/RH(D): B POS
Antibody Screen: NEGATIVE
Unit division: 0
Unit division: 0
Unit division: 0
Unit division: 0
Unit division: 0

## 2024-01-16 LAB — BPAM RBC
Blood Product Expiration Date: 202509162359
Blood Product Expiration Date: 202509162359
Blood Product Expiration Date: 202509302359
Blood Product Expiration Date: 202509092359
Blood Product Expiration Date: 202509252359
ISSUE DATE / TIME: 202508251921
ISSUE DATE / TIME: 202508252239
Unit Type and Rh: 7300
Unit Type and Rh: 7300
Unit Type and Rh: 9500
Unit Type and Rh: 5100
Unit Type and Rh: 5100

## 2024-01-16 LAB — HEMOGLOBIN AND HEMATOCRIT, BLOOD
HCT: 35 % — ABNORMAL LOW (ref 36.0–46.0)
Hemoglobin: 11.3 g/dL — ABNORMAL LOW (ref 12.0–15.0)

## 2024-01-16 MED ORDER — HYDROMORPHONE HCL 2 MG/ML IJ SOLN
2.0000 mg | Freq: Once | INTRAMUSCULAR | Status: AC | PRN
  Administered 2024-01-16: 2 mg via INTRAVENOUS
  Filled 2024-01-16: qty 1

## 2024-01-16 NOTE — Progress Notes (Signed)
 Pt declined to have dressing changes at 1330 and 1700. Explained to pt that she would receive pain medicine before dressing change but pt continued to refuse. Asmall RN

## 2024-01-16 NOTE — Progress Notes (Signed)
 This nurse contacted the Blood Bank regarding PRBC pick up. Per Blood Bank the cross match screening would be another 30 minutes and they will call whenever the blood is ready for pickup. Day shift nurse will be notified.

## 2024-01-16 NOTE — Progress Notes (Signed)
 Patient ID: [name], female   DOB: June 22, 1970, 53 y.o.   MRN: [id number] Subjective: [name] is a 53 y.o. female with medical history significant for sickle cell disease, anemia, chronic pain syndrome, chronic leg wounds, vitamin D  deficiency, and avascular necrosis who was advised by PCP to present to the ED due to low hemoglobin.  Patient reports she has had dizziness described as lightheadedness over the last week. She also endorsed associated fatigue, nausea and vomiting.   Today patient reports generalize pain and worsening bilateral lower legs of 8/10. No new concerns. Denies fever, cough, SOB, N/V/D. No urinary symptoms  Objective:  Vital signs in last 24 hours:  Vitals:   01/16/24 1152 01/16/24 1310 01/16/24 1315 01/16/24 1330  BP:  112/65 112/65 127/68  Pulse:  61 61 60  Resp: 15 12 12 13   Temp:  98.2 F (36.8 C) 98.2 F (36.8 C) 99 F (37.2 C)  TempSrc:  Oral    SpO2: 98%   98%  Weight:      Height:        Intake/Output from previous day:   Intake/Output Summary (Last 24 hours) at 01/16/2024 1352 Last data filed at 01/16/2024 1318 Gross per 24 hour  Intake 586 ml  Output 500 ml  Net 86 ml    Physical Exam: General: Alert, awake, oriented x3, in no acute distress.  HEENT: [place]/AT [place]ER[place], EOMI Neck: Trachea midline,  no masses, no thyromegal,y no JVD, no carotid bruit OROPHARYNX:  Moist, No exudate/ erythema/lesions.  Heart: Regular rate and rhythm, without murmurs, rubs, gallops, PMI non-displaced, no heaves or thrills on palpation.  Lungs: Clear to auscultation, no wheezing or rhonchi noted. No increased vocal fremitus resonant to percussion  Abdomen: Soft, nontender, nondistended, positive bowel sounds, no masses no hepatosplenomegaly noted..  Neuro: No focal neurological deficits noted cranial nerves II through XII grossly intact. DTRs 2+ bilaterally upper and lower extremities. Strength 5 out of 5 in bilateral upper and lower  extremities. Musculoskeletal: Generalize body tenderness Psychiatric: Patient alert and oriented x3, good insight and cognition, good recent to remote recall. Lymph node survey: No cervical axillary or inguinal lymphadenopathy noted.  Lab Results:  Basic Metabolic Panel:    Component Value Date/Time   NA 139 01/13/2024 0418   NA 139 01/09/2024 1413   NA 139 01/20/2014 0758   K 3.6 01/13/2024 0418   K 3.9 01/20/2014 0758   CL 110 01/13/2024 0418   CO2 22 01/13/2024 0418   CO2 23 01/20/2014 0758   BUN 10 01/13/2024 0418   BUN 10 01/09/2024 1413   BUN 6.0 (L) 01/20/2014 0758   CREATININE 0.51 01/13/2024 0418   CREATININE 0.30 (L) 12/20/2016 0925   CREATININE 0.5 (L) 01/20/2014 0758   GLUCOSE 94 01/13/2024 0418   GLUCOSE 72 01/20/2014 0758   CALCIUM  8.6 (L) 01/13/2024 0418   CALCIUM  9.0 01/20/2014 0758   CBC:    Component Value Date/Time   WBC 12.4 (H) 01/14/2024 1034   HGB 7.4 (L) 01/14/2024 1034   HGB 5.6 (LL) 01/09/2024 1413   HGB 8.7 (L) 01/20/2014 0757   HCT 22.0 (L) 01/14/2024 1034   HCT 16.6 (LL) 01/09/2024 1413   HCT 25.2 (L) 01/20/2014 0757   PLT 396 01/14/2024 1034   PLT 490 (H) 01/09/2024 1413   MCV 86.6 01/14/2024 1034   MCV 89 01/09/2024 1413   MCV 84.3 01/20/2014 0757   NEUTROABS 9.3 (H) 01/12/2024 1754   NEUTROABS 10.7 (H) 01/09/2024 1413  NEUTROABS 8.7 (H) 01/20/2014 0757   LYMPHSABS 1.8 01/12/2024 1754   LYMPHSABS 2.5 01/09/2024 1413   LYMPHSABS 2.7 01/20/2014 0757   MONOABS 1.5 (H) 01/12/2024 1754   MONOABS 1.9 (H) 01/20/2014 0757   EOSABS 0.1 01/12/2024 1754   EOSABS 0.8 (H) 01/09/2024 1413   BASOSABS 0.1 01/12/2024 1754   BASOSABS 0.2 01/09/2024 1413   BASOSABS 0.2 (H) 01/20/2014 0757    Recent Results (from the past 240 hours)  Urine Culture     Status: Abnormal   Collection Time: 01/09/24  2:41 PM   Specimen: Urine   UR  Result Value Ref Range Status   Urine Culture, Routine Final report (A)  Final   Organism ID, Bacteria  Escherichia coli (A)  Final    Comment: Cefazolin  with an MIC <=16 predicts susceptibility to the oral agents cefaclor, cefdinir, cefpodoxime, cefprozil, cefuroxime, cephalexin, and loracarbef when used for therapy of uncomplicated urinary tract infections due to E. coli, Klebsiella pneumoniae, and Proteus mirabilis. Greater than 100,000 colony forming units per mL    Antimicrobial Susceptibility Comment  Final    Comment:       ** S = Susceptible; I = Intermediate; R = Resistant **                    P = Positive; N = Negative             MICS are expressed in micrograms per mL    Antibiotic                 RSLT#1    RSLT#2    RSLT#3    RSLT#4 Amoxicillin /Clavulanic Acid    S Ampicillin                      S Cefazolin                       S Cefepime                        S Cefoxitin                      S Cefpodoxime                    S Ceftriaxone                     S Ciprofloxacin                   S Ertapenem                      S Gentamicin                      S Levofloxacin                    S Meropenem                      S Nitrofurantoin                  S Piperacillin /Tazobactam        S Tetracycline                   S Tobramycin  S Trimethoprim /Sulfa              S   MRSA Next Gen by PCR, Nasal     Status: None   Collection Time: 01/13/24 12:40 PM   Specimen: Nasal Mucosa; Nasal Swab  Result Value Ref Range Status   MRSA by PCR Next Gen NOT DETECTED NOT DETECTED Final    Comment: (NOTE) The GeneXpert MRSA Assay (FDA approved for NASAL specimens only), is one component of a comprehensive MRSA colonization surveillance program. It is not intended to diagnose MRSA infection nor to guide or monitor treatment for MRSA infections. Test performance is not FDA approved in patients less than 33 years old. Performed at [facility], [address] [place], [place] [address]     Studies/Results: No results  found.  Medications: Scheduled Meds:  aspirin  EC  81 mg Oral Daily   feeding supplement  237 mL Oral TID   folic acid   1 mg Oral Daily   gabapentin   600 mg Oral TID   gentamicin  cream   Topical QHS   HYDROmorphone    Intravenous Q4H   multivitamin with minerals  1 tablet Oral Daily   mupirocin  ointment  1 Application Topical QHS   pentoxifylline   400 mg Oral BID   sulfamethoxazole -trimethoprim   1 tablet Oral BID   triamcinolone  ointment   Topical QODAY   Continuous Infusions:   PRN Meds:.acetaminophen  **OR** acetaminophen , diphenhydrAMINE , HYDROmorphone  (DILAUDID ) injection, naloxone  **AND** sodium chloride  flush, ondansetron  **OR** ondansetron  (ZOFRAN ) IV, oxyCODONE , polyethylene glycol powder  Consultants: None  Procedures: Exchange transfusion.  Antibiotics: Bactrim   Assessment/Plan: Principal Problem:   Symptomatic anemia Active Problems:   Leukocytosis   Chronic pain syndrome   Bilateral leg ulcer (HCC)   Acute cystitis without hematuria   Pain in both lower extremities   Sickle cell disease without crisis (HCC)   Acute on chronic anemia   Hb Sickle Cell Disease with Pain crisis: Continue PCA on current setting Continue IVF 0.45% Saline @ KVO, continue IV Toradol  15 mg Q 6 H for a total of 5 days, continue oral home pain medications as ordered. Monitor vitals very closely, Re-evaluate pain scale regularly, 2 L of Oxygen  by [place]. Patient encouraged to ambulate on the hallway today.  Symptomatic anemia: Hgb of 4.8 on admission from baseline of 7-8. Transfuse 2 units PRBCs.  Leukocytosis: Elevated 12.4 today. Continue Bactrim . Anemia of Chronic Disease:  Hgb improved after transfusing 2 unit PRBC at 7.4. will continue to monitor daily CBC.  Chronic pain Syndrome: Continue oral home pain medication  Acute Cystitis without hematuria: Urinalysis positive for E-coli: continue antibiotics as prescribed.  Bilateral Leg ulcer: Chronic BLE ulcer. Wound care consult completed.  Exchange transfusion, pre medicate patient prior to wound dressing change.   Code Status: Full Code Family Communication: N/A Disposition Plan: Not yet ready for discharge  Homer CHRISTELLA Cover NP   If 7PM-7AM, please contact night-coverage.  01/16/2024, 1:52 PM  LOS: 3 days

## 2024-01-16 NOTE — Progress Notes (Addendum)
 This nurse went to the Blood Bank to pick up the pts first unit of blood. Per blood bank the order had expired 30 minutes ago and I was told to submit a new order for a cross match screening. The provider was notified and requested for a new typing screening to be ordered.    IV Team did a therapeutic phlebotomy prior to infusion, phlebotomy was being completed when blood bank called and said blood was ready.

## 2024-01-16 NOTE — Plan of Care (Signed)
  Problem: Education: Goal: Knowledge of General Education information will improve Description: Including pain rating scale, medication(s)/side effects and non-pharmacologic comfort measures Outcome: Progressing   Problem: Activity: Goal: Risk for activity intolerance will decrease Outcome: Progressing   Problem: Coping: Goal: Level of anxiety will decrease Outcome: Progressing   Problem: Pain Managment: Goal: General experience of comfort will improve and/or be controlled Outcome: Progressing   Problem: Skin Integrity: Goal: Risk for impaired skin integrity will decrease Outcome: Progressing   Problem: Education: Goal: Awareness of infection prevention will improve Outcome: Progressing Goal: Awareness of signs and symptoms of anemia will improve Outcome: Progressing   Problem: Self-Care: Goal: Ability to incorporate actions that prevent/reduce pain crisis will improve Outcome: Progressing   Problem: Sensory: Goal: Pain level will decrease with appropriate interventions Outcome: Progressing

## 2024-01-17 DIAGNOSIS — D649 Anemia, unspecified: Secondary | ICD-10-CM | POA: Diagnosis not present

## 2024-01-17 LAB — CBC
HCT: 30.1 % — ABNORMAL LOW (ref 36.0–46.0)
Hemoglobin: 10 g/dL — ABNORMAL LOW (ref 12.0–15.0)
MCH: 28.7 pg (ref 26.0–34.0)
MCHC: 33.2 g/dL (ref 30.0–36.0)
MCV: 86.2 fL (ref 80.0–100.0)
Platelets: 358 K/uL (ref 150–400)
RBC: 3.49 MIL/uL — ABNORMAL LOW (ref 3.87–5.11)
RDW: 19.4 % — ABNORMAL HIGH (ref 11.5–15.5)
WBC: 11.1 K/uL — ABNORMAL HIGH (ref 4.0–10.5)
nRBC: 0.5 % — ABNORMAL HIGH (ref 0.0–0.2)

## 2024-01-17 MED ORDER — BUTALBITAL-APAP-CAFFEINE 50-325-40 MG PO TABS
1.0000 | ORAL_TABLET | Freq: Four times a day (QID) | ORAL | Status: AC | PRN
  Administered 2024-01-17: 2 via ORAL
  Administered 2024-01-19: 1 via ORAL
  Filled 2024-01-17: qty 2
  Filled 2024-01-17: qty 1

## 2024-01-17 MED ORDER — LACTULOSE 10 GM/15ML PO SOLN
30.0000 g | Freq: Two times a day (BID) | ORAL | Status: DC
  Administered 2024-01-17 – 2024-01-24 (×4): 30 g via ORAL
  Filled 2024-01-17 (×12): qty 45

## 2024-01-17 MED ORDER — LACTULOSE 10 GM/15ML PO SOLN: 30.0000 g | Freq: Two times a day (BID) | ORAL | Status: DC | PRN

## 2024-01-17 MED ORDER — HYDROMORPHONE 1 MG/ML IV SOLN
INTRAVENOUS | Status: DC
  Administered 2024-01-18: 1.5 mg via INTRAVENOUS
  Administered 2024-01-18: 4.5 mg via INTRAVENOUS
  Administered 2024-01-18: 3.5 mg via INTRAVENOUS
  Administered 2024-01-18: 6 mg via INTRAVENOUS
  Administered 2024-01-18: 5.5 mg via INTRAVENOUS
  Administered 2024-01-18: 4.5 mg via INTRAVENOUS
  Administered 2024-01-19: 7 mg via INTRAVENOUS
  Administered 2024-01-19: 1.5 mg via INTRAVENOUS
  Administered 2024-01-19: 5 mg via INTRAVENOUS
  Administered 2024-01-19: 30 mg via INTRAVENOUS
  Administered 2024-01-19: 4 mg via INTRAVENOUS
  Administered 2024-01-19: 4.5 mg via INTRAVENOUS
  Administered 2024-01-20: 7.5 mg via INTRAVENOUS
  Administered 2024-01-20: 30 mg via INTRAVENOUS
  Administered 2024-01-20: 1 mg via INTRAVENOUS
  Administered 2024-01-20: 3 mg via INTRAVENOUS
  Administered 2024-01-20: 8 mg via INTRAVENOUS
  Administered 2024-01-21: 6.5 mg via INTRAVENOUS
  Administered 2024-01-21: 30 mg via INTRAVENOUS
  Administered 2024-01-21: 4.5 mg via INTRAVENOUS
  Administered 2024-01-21: 7 mg via INTRAVENOUS
  Administered 2024-01-21: 10 mg via INTRAVENOUS
  Administered 2024-01-22 (×2): 5.5 mg via INTRAVENOUS
  Administered 2024-01-22: 4 mg via INTRAVENOUS
  Administered 2024-01-22: 5.5 mg via INTRAVENOUS
  Administered 2024-01-22: 4 mg via INTRAVENOUS
  Administered 2024-01-22: 3 mg via INTRAVENOUS
  Administered 2024-01-23: 30 mg via INTRAVENOUS
  Administered 2024-01-23: 12 mg via INTRAVENOUS
  Administered 2024-01-23: 1.5 mg via INTRAVENOUS
  Administered 2024-01-23: 30 mg via INTRAVENOUS
  Administered 2024-01-23: 7.5 mg via INTRAVENOUS
  Administered 2024-01-23: 4 mg via INTRAVENOUS
  Administered 2024-01-23: 5 mg via INTRAVENOUS
  Administered 2024-01-23: 6.5 mg via INTRAVENOUS
  Administered 2024-01-23: 4.5 mg via INTRAVENOUS
  Administered 2024-01-24: 2.5 mg via INTRAVENOUS
  Filled 2024-01-17 (×7): qty 30

## 2024-01-17 NOTE — Plan of Care (Signed)

## 2024-01-17 NOTE — Plan of Care (Signed)
 Problem: Education: Goal: Knowledge of General Education information will improve Description: Including pain rating scale, medication(s)/side effects and non-pharmacologic comfort measures 01/17/2024 1830 by Angelica Angeline CROME, RN Outcome: Progressing 01/17/2024 1709 by Angelica Angeline CROME, RN Outcome: Progressing   Problem: Health Behavior/Discharge Planning: Goal: Ability to manage health-related needs will improve 01/17/2024 1830 by Angelica Angeline CROME, RN Outcome: Progressing 01/17/2024 1709 by Angelica Angeline CROME, RN Outcome: Progressing   Problem: Clinical Measurements: Goal: Ability to maintain clinical measurements within normal limits will improve 01/17/2024 1830 by Angelica Angeline CROME, RN Outcome: Progressing 01/17/2024 1709 by Angelica Angeline CROME, RN Outcome: Progressing Goal: Will remain free from infection 01/17/2024 1830 by Angelica Angeline CROME, RN Outcome: Progressing 01/17/2024 1709 by Angelica Angeline CROME, RN Outcome: Progressing Goal: Diagnostic test results will improve 01/17/2024 1830 by Angelica Angeline CROME, RN Outcome: Progressing 01/17/2024 1709 by Angelica Angeline CROME, RN Outcome: Progressing Goal: Respiratory complications will improve 01/17/2024 1830 by Angelica Angeline CROME, RN Outcome: Progressing 01/17/2024 1709 by Angelica Angeline CROME, RN Outcome: Progressing Goal: Cardiovascular complication will be avoided 01/17/2024 1830 by Angelica Angeline CROME, RN Outcome: Progressing 01/17/2024 1709 by Angelica Angeline CROME, RN Outcome: Progressing   Problem: Activity: Goal: Risk for activity intolerance will decrease 01/17/2024 1830 by Angelica Angeline CROME, RN Outcome: Progressing 01/17/2024 1709 by Angelica Angeline CROME, RN Outcome: Progressing   Problem: Nutrition: Goal: Adequate nutrition will be maintained 01/17/2024 1830 by Angelica Angeline CROME, RN Outcome: Progressing 01/17/2024 1709 by Angelica Angeline CROME, RN Outcome: Progressing   Problem: Coping: Goal: Level of anxiety will decrease 01/17/2024 1830 by Angelica Angeline CROME, RN Outcome:  Progressing 01/17/2024 1709 by Angelica Angeline CROME, RN Outcome: Progressing   Problem: Elimination: Goal: Will not experience complications related to bowel motility 01/17/2024 1830 by Angelica Angeline CROME, RN Outcome: Progressing 01/17/2024 1709 by Angelica Angeline CROME, RN Outcome: Progressing Goal: Will not experience complications related to urinary retention 01/17/2024 1830 by Angelica Angeline CROME, RN Outcome: Progressing 01/17/2024 1709 by Angelica Angeline CROME, RN Outcome: Progressing   Problem: Pain Managment: Goal: General experience of comfort will improve and/or be controlled 01/17/2024 1830 by Angelica Angeline CROME, RN Outcome: Progressing 01/17/2024 1709 by Angelica Angeline CROME, RN Outcome: Progressing   Problem: Safety: Goal: Ability to remain free from injury will improve 01/17/2024 1830 by Angelica Angeline CROME, RN Outcome: Progressing 01/17/2024 1709 by Angelica Angeline CROME, RN Outcome: Progressing   Problem: Skin Integrity: Goal: Risk for impaired skin integrity will decrease 01/17/2024 1830 by Angelica Angeline CROME, RN Outcome: Progressing 01/17/2024 1709 by Angelica Angeline CROME, RN Outcome: Progressing   Problem: Education: Goal: Knowledge of vaso-occlusive preventative measures will improve 01/17/2024 1830 by Angelica Angeline CROME, RN Outcome: Progressing 01/17/2024 1709 by Angelica Angeline CROME, RN Outcome: Progressing Goal: Awareness of infection prevention will improve 01/17/2024 1830 by Angelica Angeline CROME, RN Outcome: Progressing 01/17/2024 1709 by Angelica Angeline CROME, RN Outcome: Progressing Goal: Awareness of signs and symptoms of anemia will improve 01/17/2024 1830 by Angelica Angeline CROME, RN Outcome: Progressing 01/17/2024 1709 by Angelica Angeline CROME, RN Outcome: Progressing Goal: Long-term complications will improve 01/17/2024 1830 by Angelica Angeline CROME, RN Outcome: Progressing 01/17/2024 1709 by Angelica Angeline CROME, RN Outcome: Progressing   Problem: Self-Care: Goal: Ability to incorporate actions that prevent/reduce pain crisis will  improve 01/17/2024 1830 by Angelica Angeline CROME, RN Outcome: Progressing 01/17/2024 1709 by Angelica Angeline CROME, RN Outcome: Progressing   Problem: Bowel/Gastric: Goal: Gut motility will be maintained 01/17/2024 1830 by Angelica Angeline CROME, RN Outcome: Progressing 01/17/2024 1709  by Angelica Angeline CROME, RN Outcome: Progressing   Problem: Tissue Perfusion: Goal: Complications related to inadequate tissue perfusion will be avoided or minimized 01/17/2024 1830 by Angelica Angeline CROME, RN Outcome: Progressing 01/17/2024 1709 by Angelica Angeline CROME, RN Outcome: Progressing   Problem: Respiratory: Goal: Pulmonary complications will be avoided or minimized 01/17/2024 1830 by Angelica Angeline CROME, RN Outcome: Progressing 01/17/2024 1709 by Angelica Angeline CROME, RN Outcome: Progressing Goal: Acute Chest Syndrome will be identified early to prevent complications 01/17/2024 1830 by Angelica Angeline CROME, RN Outcome: Progressing 01/17/2024 1709 by Angelica Angeline CROME, RN Outcome: Progressing   Problem: Fluid Volume: Goal: Ability to maintain a balanced intake and output will improve 01/17/2024 1830 by Angelica Angeline CROME, RN Outcome: Progressing 01/17/2024 1709 by Angelica Angeline CROME, RN Outcome: Progressing   Problem: Sensory: Goal: Pain level will decrease with appropriate interventions 01/17/2024 1830 by Angelica Angeline CROME, RN Outcome: Progressing 01/17/2024 1709 by Angelica Angeline CROME, RN Outcome: Progressing   Problem: Health Behavior: Goal: Postive changes in compliance with treatment and prescription regimens will improve 01/17/2024 1830 by Angelica Angeline CROME, RN Outcome: Progressing 01/17/2024 1709 by Angelica Angeline CROME, RN Outcome: Progressing

## 2024-01-17 NOTE — Progress Notes (Addendum)
 Patient ID: [name], female   DOB: 20-Sep-1970, 53 y.o.   MRN: [id number] Subjective: [name] is a 53 y.o. female with medical history significant for sickle cell disease, anemia, chronic pain syndrome, chronic leg wounds, vitamin D  deficiency, and avascular necrosis who was advised by PCP to present to the ED due to low hemoglobin.  She was subsequently admitted to the hospital for symptomatic anemia.  Today patient has had multiple blood transfusion, her dizziness and lightheadedness has significantly improved but she has occasional headache especially at night.  She is still also experiencing significant pain in her lower extremities and her hip joints.  She said her pain is still at about 8/10 in her lower extremities.  She however denies any fever, cough, chest pain, shortness of breath, nausea, vomiting or diarrhea.  She has no urinary symptoms.  Objective:  Vital signs in last 24 hours:  Vitals:   01/17/24 0800 01/17/24 1042 01/17/24 1227 01/17/24 1355  BP:  110/67  136/81  Pulse:  (!) 53  61  Resp: 16  11   Temp:  98.2 F (36.8 C)  98 F (36.7 C)  TempSrc:  Oral  Oral  SpO2: 99% 98% 98% 100%  Weight:      Height:        Intake/Output from previous day:   Intake/Output Summary (Last 24 hours) at 01/17/2024 1555 Last data filed at 01/17/2024 0900 Gross per 24 hour  Intake 698 ml  Output --  Net 698 ml    Physical Exam: General: Alert, awake, oriented x3, in no acute distress but chronically ill looking.  HEENT: [place]/AT [place], EOMI Neck: Trachea midline,  no masses, no thyromegal,y no JVD, no carotid bruit OROPHARYNX:  Moist, No exudate/ erythema/lesions.  Heart: Regular rate and rhythm, without murmurs, rubs, gallops, PMI non-displaced, no heaves or thrills on palpation.  Lungs: Clear to auscultation, no wheezing or rhonchi noted. No increased vocal fremitus resonant to percussion  Abdomen: Soft, nontender, nondistended, positive bowel sounds, no masses no  hepatosplenomegaly noted..  Neuro: No focal neurological deficits noted cranial nerves II through XII grossly intact. DTRs 2+ bilaterally upper and lower extremities. Strength 5 out of 5 in bilateral upper and lower extremities. Musculoskeletal: Bilateral lower extremity wounds in dressings. No warm swelling or erythema around joints, no spinal tenderness noted. Psychiatric: Patient alert and oriented x3, good insight and cognition, good recent to remote recall. Lymph node survey: No cervical axillary or inguinal lymphadenopathy noted.  Lab Results:  Basic Metabolic Panel:    Component Value Date/Time   NA 139 01/13/2024 0418   NA 139 01/09/2024 1413   NA 139 01/20/2014 0758   K 3.6 01/13/2024 0418   K 3.9 01/20/2014 0758   CL 110 01/13/2024 0418   CO2 22 01/13/2024 0418   CO2 23 01/20/2014 0758   BUN 10 01/13/2024 0418   BUN 10 01/09/2024 1413   BUN 6.0 (L) 01/20/2014 0758   CREATININE 0.51 01/13/2024 0418   CREATININE 0.30 (L) 12/20/2016 0925   CREATININE 0.5 (L) 01/20/2014 0758   GLUCOSE 94 01/13/2024 0418   GLUCOSE 72 01/20/2014 0758   CALCIUM  8.6 (L) 01/13/2024 0418   CALCIUM  9.0 01/20/2014 0758   CBC:    Component Value Date/Time   WBC 11.1 (H) 01/17/2024 0930   HGB 10.0 (L) 01/17/2024 0930   HGB 5.6 (LL) 01/09/2024 1413   HGB 8.7 (L) 01/20/2014 0757   HCT 30.1 (L) 01/17/2024 0930   HCT 16.6 (LL) 01/09/2024 1413  HCT 25.2 (L) 01/20/2014 0757   PLT 358 01/17/2024 0930   PLT 490 (H) 01/09/2024 1413   MCV 86.2 01/17/2024 0930   MCV 89 01/09/2024 1413   MCV 84.3 01/20/2014 0757   NEUTROABS 9.3 (H) 01/12/2024 1754   NEUTROABS 10.7 (H) 01/09/2024 1413   NEUTROABS 8.7 (H) 01/20/2014 0757   LYMPHSABS 1.8 01/12/2024 1754   LYMPHSABS 2.5 01/09/2024 1413   LYMPHSABS 2.7 01/20/2014 0757   MONOABS 1.5 (H) 01/12/2024 1754   MONOABS 1.9 (H) 01/20/2014 0757   EOSABS 0.1 01/12/2024 1754   EOSABS 0.8 (H) 01/09/2024 1413   BASOSABS 0.1 01/12/2024 1754   BASOSABS 0.2  01/09/2024 1413   BASOSABS 0.2 (H) 01/20/2014 0757    Recent Results (from the past 240 hours)  Urine Culture     Status: Abnormal   Collection Time: 01/09/24  2:41 PM   Specimen: Urine   UR  Result Value Ref Range Status   Urine Culture, Routine Final report (A)  Final   Organism ID, Bacteria Escherichia coli (A)  Final    Comment: Cefazolin  with an MIC <=16 predicts susceptibility to the oral agents cefaclor, cefdinir, cefpodoxime, cefprozil, cefuroxime, cephalexin, and loracarbef when used for therapy of uncomplicated urinary tract infections due to E. coli, Klebsiella pneumoniae, and Proteus mirabilis. Greater than 100,000 colony forming units per mL    Antimicrobial Susceptibility Comment  Final    Comment:       ** S = Susceptible; I = Intermediate; R = Resistant **                    P = Positive; N = Negative             MICS are expressed in micrograms per mL    Antibiotic                 RSLT#1    RSLT#2    RSLT#3    RSLT#4 Amoxicillin /Clavulanic Acid    S Ampicillin                      S Cefazolin                       S Cefepime                        S Cefoxitin                      S Cefpodoxime                    S Ceftriaxone                     S Ciprofloxacin                   S Ertapenem                      S Gentamicin                      S Levofloxacin                    S Meropenem                      S Nitrofurantoin   S Piperacillin /Tazobactam        S Tetracycline                   S Tobramycin                     S Trimethoprim /Sulfa              S   MRSA Next Gen by PCR, Nasal     Status: None   Collection Time: 01/13/24 12:40 PM   Specimen: Nasal Mucosa; Nasal Swab  Result Value Ref Range Status   MRSA by PCR Next Gen NOT DETECTED NOT DETECTED Final    Comment: (NOTE) The GeneXpert MRSA Assay (FDA approved for NASAL specimens only), is one component of a comprehensive MRSA colonization surveillance program. It is not intended to  diagnose MRSA infection nor to guide or monitor treatment for MRSA infections. Test performance is not FDA approved in patients less than 62 years old. Performed at [facility], [address] [place], [place] [address]     Studies/Results: No results found.  Medications: Scheduled Meds:  aspirin  EC  81 mg Oral Daily   feeding supplement  237 mL Oral TID   folic acid   1 mg Oral Daily   gabapentin   600 mg Oral TID   gentamicin  cream   Topical QHS   HYDROmorphone    Intravenous Q4H   lactulose   30 g Oral BID   multivitamin with minerals  1 tablet Oral Daily   mupirocin  ointment  1 Application Topical QHS   pentoxifylline   400 mg Oral BID   sulfamethoxazole -trimethoprim   1 tablet Oral BID   triamcinolone  ointment   Topical QODAY   Continuous Infusions: PRN Meds:.acetaminophen  **OR** acetaminophen , butalbital -acetaminophen -caffeine , diphenhydrAMINE , naloxone  **AND** sodium chloride  flush, ondansetron  **OR** ondansetron  (ZOFRAN ) IV, oxyCODONE   Consultants: Wound care consult  Procedures: Simple exchange transfusion  Antibiotics: Bactrim   Assessment/Plan: Principal Problem:   Symptomatic anemia Active Problems:   Leukocytosis   Chronic pain syndrome   Bilateral leg ulcer (HCC)   Acute cystitis without hematuria   Pain in both lower extremities   Sickle cell disease without crisis (HCC)   Acute on chronic anemia  Hb Sickle Cell Disease with Pain crisis: IV fluid at KVO.  Continue weight based Dilaudid  PCA at current dose setting, continue IV Toradol  15 mg Q 6 H for a total of 5 days, continue oral home pain medications as ordered. Monitor vitals very closely, Re-evaluate pain scale regularly, 2 L of Oxygen  by [place]. Patient encouraged to ambulate on the hallway today.  Symptomatic anemia: Hemoglobin was 4.8 on admission, patient has been transfused with 2 units of packed red blood cells, with no significant improvement in her symptoms, she also got a mini  exchange.  Now hemoglobin is at 10.0, slightly above patient's baseline.  Will continue to monitor very closely. Leukocytosis: Mild elevation in WBC count.  Patient is on Bactrim .  Will continue. Chronic pain Syndrome: Continue oral home pain medications as ordered. Bilateral leg ulcer: Wound care consult.  Patient has had simple exchange transfusion.  Will continue wound care. Acute cystitis without hematuria: Positive urinalysis for E. coli.  Patient is on antibiotics per sensitivity report.  Will continue.  Code Status: Full Code Family Communication: N/A Disposition Plan: Not yet ready for discharge  Reagann Dolce  If 7PM-7AM, please contact night-coverage.  01/17/2024, 3:55 PM  LOS: 4 days

## 2024-01-17 NOTE — Progress Notes (Signed)
 Patient allowed this Clinical research associate and primary nurse to change dressings on BLE, educated on importance of adherence to dressing changes as well as easier removal and application methods

## 2024-01-18 DIAGNOSIS — D649 Anemia, unspecified: Secondary | ICD-10-CM | POA: Diagnosis not present

## 2024-01-18 LAB — COMPREHENSIVE METABOLIC PANEL WITH GFR
ALT: 22 U/L (ref 0–44)
AST: 55 U/L — ABNORMAL HIGH (ref 15–41)
Albumin: 3.9 g/dL (ref 3.5–5.0)
Alkaline Phosphatase: 60 U/L (ref 38–126)
Anion gap: 11 (ref 5–15)
BUN: 11 mg/dL (ref 6–20)
CO2: 22 mmol/L (ref 22–32)
Calcium: 9.8 mg/dL (ref 8.9–10.3)
Chloride: 108 mmol/L (ref 98–111)
Creatinine, Ser: 0.5 mg/dL (ref 0.44–1.00)
GFR, Estimated: >60 mL/min (ref >60)
Glucose, Bld: 79 mg/dL (ref 70–99)
Potassium: 3.7 mmol/L (ref 3.5–5.1)
Sodium: 141 mmol/L (ref 135–145)
Total Bilirubin: 1.4 mg/dL — ABNORMAL HIGH (ref 0.0–1.2)
Total Protein: 7.7 g/dL (ref 6.5–8.1)

## 2024-01-18 LAB — CBC WITH DIFFERENTIAL/PLATELET
Abs Immature Granulocytes: 0.06 K/uL (ref 0.00–0.07)
Basophils Absolute: 0.2 K/uL — ABNORMAL HIGH (ref 0.0–0.1)
Basophils Relative: 1 %
Eosinophils Absolute: 1.3 K/uL — ABNORMAL HIGH (ref 0.0–0.5)
Eosinophils Relative: 10 %
HCT: 29 % — ABNORMAL LOW (ref 36.0–46.0)
Hemoglobin: 9.5 g/dL — ABNORMAL LOW (ref 12.0–15.0)
Immature Granulocytes: 1 %
Lymphocytes Relative: 14 %
Lymphs Abs: 1.7 K/uL (ref 0.7–4.0)
MCH: 28.6 pg (ref 26.0–34.0)
MCHC: 32.8 g/dL (ref 30.0–36.0)
MCV: 87.3 fL (ref 80.0–100.0)
Monocytes Absolute: 1.5 K/uL — ABNORMAL HIGH (ref 0.1–1.0)
Monocytes Relative: 12 %
Neutro Abs: 7.7 K/uL (ref 1.7–7.7)
Neutrophils Relative %: 62 %
Platelets: 358 K/uL (ref 150–400)
RBC: 3.32 MIL/uL — ABNORMAL LOW (ref 3.87–5.11)
RDW: 18.9 % — ABNORMAL HIGH (ref 11.5–15.5)
WBC: 12.4 K/uL — ABNORMAL HIGH (ref 4.0–10.5)
nRBC: 0.3 % — ABNORMAL HIGH (ref 0.0–0.2)

## 2024-01-18 LAB — LACTATE DEHYDROGENASE: LDH: 439 U/L — ABNORMAL HIGH (ref 98–192)

## 2024-01-18 MED ORDER — ORAL CARE MOUTH RINSE: 15.0000 mL | OROMUCOSAL | Status: DC | PRN

## 2024-01-18 NOTE — Plan of Care (Signed)

## 2024-01-18 NOTE — Progress Notes (Signed)
 Patient ID: [name], female   DOB: March 17, 1971, 53 y.o.   MRN: [id number] Subjective: [name] is a 53 y.o. female with medical history significant for sickle cell disease, anemia, chronic pain syndrome, chronic leg wounds, vitamin D  deficiency, and avascular necrosis who was advised by PCP to present to the ED due to low hemoglobin.  She was subsequently admitted to the hospital for symptomatic anemia.  Patient reports feeling slightly better today.  She was able to have bowel movement yesterday and this morning which has provided some relief.  She is still having pain in her lower back and lower extremities.  She said her pain is about 7/10 especially in her lower extremities today.  Headache appears to have resolved.  She denies any fever, cough, chest pain, shortness of breath, nausea, vomiting or diarrhea.  No urinary symptoms.  Objective:  Vital signs in last 24 hours:  Vitals:   01/18/24 0800 01/18/24 0837 01/18/24 0838 01/18/24 1238  BP:  119/70  117/66  Pulse:  (!) 56 (!) 54 (!) 59  Resp: 16 16 16 16   Temp:  97.9 F (36.6 C)  98 F (36.7 C)  TempSrc:  Oral  Oral  SpO2: 100% 100% 100% 100%  Weight:      Height:        Intake/Output from previous day:   Intake/Output Summary (Last 24 hours) at 01/18/2024 1326 Last data filed at 01/18/2024 0845 Gross per 24 hour  Intake 602.3 ml  Output --  Net 602.3 ml    Physical Exam: General: Alert, awake, oriented x3, in no acute distress but chronically ill looking.  HEENT: [place]/AT [place]ER[place], EOMI Neck: Trachea midline,  no masses, no thyromegal,y no JVD, no carotid bruit OROPHARYNX:  Moist, No exudate/ erythema/lesions.  Heart: Regular rate and rhythm, without murmurs, rubs, gallops, PMI non-displaced, no heaves or thrills on palpation.  Lungs: Clear to auscultation, no wheezing or rhonchi noted. No increased vocal fremitus resonant to percussion  Abdomen: Soft, nontender, nondistended, positive bowel sounds, no masses no  hepatosplenomegaly noted..  Neuro: No focal neurological deficits noted cranial nerves II through XII grossly intact. DTRs 2+ bilaterally upper and lower extremities. Strength 5 out of 5 in bilateral upper and lower extremities. Musculoskeletal: Bilateral lower extremity wounds in dressings. No warm swelling or erythema around joints, no spinal tenderness noted. Psychiatric: Patient alert and oriented x3, good insight and cognition, good recent to remote recall. Lymph node survey: No cervical axillary or inguinal lymphadenopathy noted.  Lab Results:  Basic Metabolic Panel:    Component Value Date/Time   NA 141 01/18/2024 0636   NA 139 01/09/2024 1413   NA 139 01/20/2014 0758   K 3.7 01/18/2024 0636   K 3.9 01/20/2014 0758   CL 108 01/18/2024 0636   CO2 22 01/18/2024 0636   CO2 23 01/20/2014 0758   BUN 11 01/18/2024 0636   BUN 10 01/09/2024 1413   BUN 6.0 (L) 01/20/2014 0758   CREATININE 0.50 01/18/2024 0636   CREATININE 0.30 (L) 12/20/2016 0925   CREATININE 0.5 (L) 01/20/2014 0758   GLUCOSE 79 01/18/2024 0636   GLUCOSE 72 01/20/2014 0758   CALCIUM  9.8 01/18/2024 0636   CALCIUM  9.0 01/20/2014 0758   CBC:    Component Value Date/Time   WBC 12.4 (H) 01/18/2024 0636   HGB 9.5 (L) 01/18/2024 0636   HGB 5.6 (LL) 01/09/2024 1413   HGB 8.7 (L) 01/20/2014 0757   HCT 29.0 (L) 01/18/2024 0636   HCT 16.6 (LL)  01/09/2024 1413   HCT 25.2 (L) 01/20/2014 0757   PLT 358 01/18/2024 0636   PLT 490 (H) 01/09/2024 1413   MCV 87.3 01/18/2024 0636   MCV 89 01/09/2024 1413   MCV 84.3 01/20/2014 0757   NEUTROABS 7.7 01/18/2024 0636   NEUTROABS 10.7 (H) 01/09/2024 1413   NEUTROABS 8.7 (H) 01/20/2014 0757   LYMPHSABS 1.7 01/18/2024 0636   LYMPHSABS 2.5 01/09/2024 1413   LYMPHSABS 2.7 01/20/2014 0757   MONOABS 1.5 (H) 01/18/2024 0636   MONOABS 1.9 (H) 01/20/2014 0757   EOSABS 1.3 (H) 01/18/2024 0636   EOSABS 0.8 (H) 01/09/2024 1413   BASOSABS 0.2 (H) 01/18/2024 0636   BASOSABS 0.2  01/09/2024 1413   BASOSABS 0.2 (H) 01/20/2014 0757    Recent Results (from the past 240 hours)  Urine Culture     Status: Abnormal   Collection Time: 01/09/24  2:41 PM   Specimen: Urine   UR  Result Value Ref Range Status   Urine Culture, Routine Final report (A)  Final   Organism ID, Bacteria Escherichia coli (A)  Final    Comment: Cefazolin  with an MIC <=16 predicts susceptibility to the oral agents cefaclor, cefdinir, cefpodoxime, cefprozil, cefuroxime, cephalexin, and loracarbef when used for therapy of uncomplicated urinary tract infections due to E. coli, Klebsiella pneumoniae, and Proteus mirabilis. Greater than 100,000 colony forming units per mL    Antimicrobial Susceptibility Comment  Final    Comment:       ** S = Susceptible; I = Intermediate; R = Resistant **                    P = Positive; N = Negative             MICS are expressed in micrograms per mL    Antibiotic                 RSLT#1    RSLT#2    RSLT#3    RSLT#4 Amoxicillin /Clavulanic Acid    S Ampicillin                      S Cefazolin                       S Cefepime                        S Cefoxitin                      S Cefpodoxime                    S Ceftriaxone                     S Ciprofloxacin                   S Ertapenem                      S Gentamicin                      S Levofloxacin                    S Meropenem                      S Nitrofurantoin   S Piperacillin /Tazobactam        S Tetracycline                   S Tobramycin                     S Trimethoprim /Sulfa              S   MRSA Next Gen by PCR, Nasal     Status: None   Collection Time: 01/13/24 12:40 PM   Specimen: Nasal Mucosa; Nasal Swab  Result Value Ref Range Status   MRSA by PCR Next Gen NOT DETECTED NOT DETECTED Final    Comment: (NOTE) The GeneXpert MRSA Assay (FDA approved for NASAL specimens only), is one component of a comprehensive MRSA colonization surveillance program. It is not intended to  diagnose MRSA infection nor to guide or monitor treatment for MRSA infections. Test performance is not FDA approved in patients less than 26 years old. Performed at [facility], [address] [place], [place] [address]     Studies/Results: No results found.  Medications: Scheduled Meds:  aspirin  EC  81 mg Oral Daily   feeding supplement  237 mL Oral TID   folic acid   1 mg Oral Daily   gabapentin   600 mg Oral TID   gentamicin  cream   Topical QHS   HYDROmorphone    Intravenous Q4H   lactulose   30 g Oral BID   multivitamin with minerals  1 tablet Oral Daily   mupirocin  ointment  1 Application Topical QHS   pentoxifylline   400 mg Oral BID   sulfamethoxazole -trimethoprim   1 tablet Oral BID   triamcinolone  ointment   Topical QODAY   Continuous Infusions: PRN Meds:.acetaminophen  **OR** acetaminophen , butalbital -acetaminophen -caffeine , diphenhydrAMINE , naloxone  **AND** sodium chloride  flush, ondansetron  **OR** ondansetron  (ZOFRAN ) IV, mouth rinse, oxyCODONE   Consultants: Wound care consult  Procedures: Simple exchange transfusion  Antibiotics: Bactrim   Assessment/Plan: Principal Problem:   Symptomatic anemia Active Problems:   Leukocytosis   Chronic pain syndrome   Bilateral leg ulcer (HCC)   Acute cystitis without hematuria   Pain in both lower extremities   Sickle cell disease without crisis (HCC)   Acute on chronic anemia  Hb Sickle Cell Disease with Pain crisis: IV fluid at KVO.  Continue weight based Dilaudid  PCA at current dose setting, continue IV Toradol  15 mg Q 6 H for a total of 5 days, continue oral home pain medications as ordered. Monitor vitals very closely, Re-evaluate pain scale regularly, 2 L of Oxygen  by [place]. Patient encouraged to ambulate on the hallway today.  Symptomatic anemia: Hemoglobin is 9.5 today.  Patient is clinically and hemodynamically stable.  Will continue to monitor very closely.  Repeat labs in AM. Leukocytosis: Mild  elevation in WBC count, today at 12.4.  Patient is on Bactrim  for UTI.  Will continue. Chronic pain Syndrome: Continue oral home pain medications as ordered. Bilateral leg ulcer: Wound care consult. Patient has had simple exchange transfusion to assist with perfusion.  Will continue wound care. Acute cystitis without hematuria: Positive urinalysis for E. coli.  Patient is on antibiotics-Bactrim -per sensitivity report.  Will continue for total of 5 days.  Code Status: Full Code Family Communication: N/A Disposition Plan: Not yet ready for discharge  Amijah Timothy  If 7PM-7AM, please contact night-coverage.  01/18/2024, 1:26 PM  LOS: 5 days

## 2024-01-19 LAB — CBC
HCT: 29.8 % — ABNORMAL LOW (ref 36.0–46.0)
Hemoglobin: 9.3 g/dL — ABNORMAL LOW (ref 12.0–15.0)
MCH: 27.6 pg (ref 26.0–34.0)
MCHC: 31.2 g/dL (ref 30.0–36.0)
MCV: 88.4 fL (ref 80.0–100.0)
Platelets: 348 K/uL (ref 150–400)
RBC: 3.37 MIL/uL — ABNORMAL LOW (ref 3.87–5.11)
RDW: 19.1 % — ABNORMAL HIGH (ref 11.5–15.5)
WBC: 8.3 K/uL (ref 4.0–10.5)
nRBC: 0.4 % — ABNORMAL HIGH (ref 0.0–0.2)

## 2024-01-19 NOTE — Plan of Care (Signed)
 Problem: Education: Goal: Knowledge of General Education information will improve Description: Including pain rating scale, medication(s)/side effects and non-pharmacologic comfort measures 01/19/2024 2010 by Angelica Angeline CROME, RN Outcome: Progressing 01/19/2024 2009 by Angelica Angeline CROME, RN Outcome: Progressing   Problem: Health Behavior/Discharge Planning: Goal: Ability to manage health-related needs will improve 01/19/2024 2010 by Angelica Angeline CROME, RN Outcome: Progressing 01/19/2024 2009 by Angelica Angeline CROME, RN Outcome: Progressing   Problem: Clinical Measurements: Goal: Ability to maintain clinical measurements within normal limits will improve 01/19/2024 2010 by Angelica Angeline CROME, RN Outcome: Progressing 01/19/2024 2009 by Angelica Angeline CROME, RN Outcome: Progressing Goal: Will remain free from infection 01/19/2024 2010 by Angelica Angeline CROME, RN Outcome: Progressing 01/19/2024 2009 by Angelica Angeline CROME, RN Outcome: Progressing Goal: Diagnostic test results will improve 01/19/2024 2010 by Angelica Angeline CROME, RN Outcome: Progressing 01/19/2024 2009 by Angelica Angeline CROME, RN Outcome: Progressing Goal: Respiratory complications will improve 01/19/2024 2010 by Angelica Angeline CROME, RN Outcome: Progressing 01/19/2024 2009 by Angelica Angeline CROME, RN Outcome: Progressing Goal: Cardiovascular complication will be avoided 01/19/2024 2010 by Angelica Angeline CROME, RN Outcome: Progressing 01/19/2024 2009 by Angelica Angeline CROME, RN Outcome: Progressing   Problem: Activity: Goal: Risk for activity intolerance will decrease 01/19/2024 2010 by Angelica Angeline CROME, RN Outcome: Progressing 01/19/2024 2009 by Angelica Angeline CROME, RN Outcome: Progressing   Problem: Nutrition: Goal: Adequate nutrition will be maintained 01/19/2024 2010 by Angelica Angeline CROME, RN Outcome: Progressing 01/19/2024 2009 by Angelica Angeline CROME, RN Outcome: Progressing   Problem: Coping: Goal: Level of anxiety will decrease 01/19/2024 2010 by Angelica Angeline CROME, RN Outcome:  Progressing 01/19/2024 2009 by Angelica Angeline CROME, RN Outcome: Progressing   Problem: Elimination: Goal: Will not experience complications related to bowel motility 01/19/2024 2010 by Angelica Angeline CROME, RN Outcome: Progressing 01/19/2024 2009 by Angelica Angeline CROME, RN Outcome: Progressing Goal: Will not experience complications related to urinary retention 01/19/2024 2010 by Angelica Angeline CROME, RN Outcome: Progressing 01/19/2024 2009 by Angelica Angeline CROME, RN Outcome: Progressing   Problem: Pain Managment: Goal: General experience of comfort will improve and/or be controlled 01/19/2024 2010 by Angelica Angeline CROME, RN Outcome: Progressing 01/19/2024 2009 by Angelica Angeline CROME, RN Outcome: Progressing   Problem: Safety: Goal: Ability to remain free from injury will improve 01/19/2024 2010 by Angelica Angeline CROME, RN Outcome: Progressing 01/19/2024 2009 by Angelica Angeline CROME, RN Outcome: Progressing   Problem: Skin Integrity: Goal: Risk for impaired skin integrity will decrease 01/19/2024 2010 by Angelica Angeline CROME, RN Outcome: Progressing 01/19/2024 2009 by Angelica Angeline CROME, RN Outcome: Progressing   Problem: Education: Goal: Knowledge of vaso-occlusive preventative measures will improve 01/19/2024 2010 by Angelica Angeline CROME, RN Outcome: Progressing 01/19/2024 2009 by Angelica Angeline CROME, RN Outcome: Progressing Goal: Awareness of infection prevention will improve 01/19/2024 2010 by Angelica Angeline CROME, RN Outcome: Progressing 01/19/2024 2009 by Angelica Angeline CROME, RN Outcome: Progressing Goal: Awareness of signs and symptoms of anemia will improve 01/19/2024 2010 by Angelica Angeline CROME, RN Outcome: Progressing 01/19/2024 2009 by Angelica Angeline CROME, RN Outcome: Progressing Goal: Long-term complications will improve 01/19/2024 2010 by Angelica Angeline CROME, RN Outcome: Progressing 01/19/2024 2009 by Angelica Angeline CROME, RN Outcome: Progressing   Problem: Self-Care: Goal: Ability to incorporate actions that prevent/reduce pain crisis will improve 01/19/2024 2010  by Angelica Angeline CROME, RN Outcome: Progressing 01/19/2024 2009 by Angelica Angeline CROME, RN Outcome: Progressing   Problem: Bowel/Gastric: Goal: Gut motility will be maintained 01/19/2024 2010 by Angelica Angeline CROME, RN Outcome: Progressing 01/19/2024 2009  by Angelica Angeline CROME, RN Outcome: Progressing   Problem: Tissue Perfusion: Goal: Complications related to inadequate tissue perfusion will be avoided or minimized 01/19/2024 2010 by Angelica Angeline CROME, RN Outcome: Progressing 01/19/2024 2009 by Angelica Angeline CROME, RN Outcome: Progressing   Problem: Respiratory: Goal: Pulmonary complications will be avoided or minimized 01/19/2024 2010 by Angelica Angeline CROME, RN Outcome: Progressing 01/19/2024 2009 by Angelica Angeline CROME, RN Outcome: Progressing Goal: Acute Chest Syndrome will be identified early to prevent complications 01/19/2024 2010 by Angelica Angeline CROME, RN Outcome: Progressing 01/19/2024 2009 by Angelica Angeline CROME, RN Outcome: Progressing   Problem: Fluid Volume: Goal: Ability to maintain a balanced intake and output will improve 01/19/2024 2010 by Angelica Angeline CROME, RN Outcome: Progressing 01/19/2024 2009 by Angelica Angeline CROME, RN Outcome: Progressing   Problem: Sensory: Goal: Pain level will decrease with appropriate interventions 01/19/2024 2010 by Angelica Angeline CROME, RN Outcome: Progressing 01/19/2024 2009 by Angelica Angeline CROME, RN Outcome: Progressing   Problem: Health Behavior: Goal: Postive changes in compliance with treatment and prescription regimens will improve 01/19/2024 2010 by Angelica Angeline CROME, RN Outcome: Progressing 01/19/2024 2009 by Angelica Angeline CROME, RN Outcome: Progressing

## 2024-01-19 NOTE — Plan of Care (Signed)

## 2024-01-19 NOTE — Plan of Care (Signed)
  Problem: Education: Goal: Knowledge of General Education information will improve Description: Including pain rating scale, medication(s)/side effects and non-pharmacologic comfort measures Outcome: Progressing   Problem: Activity: Goal: Risk for activity intolerance will decrease Outcome: Progressing   Problem: Elimination: Goal: Will not experience complications related to bowel motility Outcome: Progressing Goal: Will not experience complications related to urinary retention Outcome: Progressing   Problem: Pain Managment: Goal: General experience of comfort will improve and/or be controlled Outcome: Progressing

## 2024-01-19 NOTE — Progress Notes (Cosign Needed)
 Patient ID: [name], female   DOB: 09-20-1970, 53 y.o.   MRN: [id number] Subjective: [name] is a 53 y.o. female with medical history significant for sickle cell disease, anemia, chronic pain syndrome, chronic leg wounds, vitamin D  deficiency, and avascular necrosis who was advised by PCP to present to the ED due to low hemoglobin.  She was subsequently admitted to the hospital for symptomatic anemia.  Patient is still having pain in her lower back and lower extremities. She said her pain is about 7/10 especially in her lower extremities today. She is concerned about her wound dressing at home and have requested for home health RN to aide with wound care and  dressing changes. She denies any fever, cough, chest pain, shortness of breath, nausea, vomiting or diarrhea.  No urinary symptoms.  Objective:  Vital signs in last 24 hours:  Vitals:   01/20/24 0527 01/20/24 0730 01/20/24 0732 01/20/24 0940  BP:   126/66 125/71  Pulse:   (!) 43 (!) 56  Resp: 16 13 13 12   Temp:   98.2 F (36.8 C)   TempSrc:   Oral   SpO2: 98% 99% 99% 96%  Weight:      Height:        Intake/Output from previous day:   Intake/Output Summary (Last 24 hours) at 01/20/2024 1103 Last data filed at 01/20/2024 0943 Gross per 24 hour  Intake 600 ml  Output --  Net 600 ml    Physical Exam: General: Alert, awake, oriented x3, in no acute distress but chronically ill looking.  HEENT: [place]/AT PEERL, EOMI Neck: Trachea midline,  no masses, no thyromegal,y no JVD, no carotid bruit OROPHARYNX:  Moist, No exudate/ erythema/lesions.  Heart: Regular rate and rhythm, without murmurs, rubs, gallops, PMI non-displaced, no heaves or thrills on palpation.  Lungs: Clear to auscultation, no wheezing or rhonchi noted. No increased vocal fremitus resonant to percussion  Abdomen: Soft, nontender, nondistended, positive bowel sounds, no masses no hepatosplenomegaly noted..  Neuro: No focal neurological deficits noted cranial  nerves II through XII grossly intact. DTRs 2+ bilaterally upper and lower extremities. Strength 5 out of 5 in bilateral upper and lower extremities. Musculoskeletal: Bilateral lower extremity wounds in dressings. No warm swelling or erythema around joints, no spinal tenderness noted. Psychiatric: Patient alert and oriented x3, good insight and cognition, good recent to remote recall. Lymph node survey: No cervical axillary or inguinal lymphadenopathy noted.  Lab Results:  Basic Metabolic Panel:    Component Value Date/Time   NA 141 01/18/2024 0636   NA 139 01/09/2024 1413   NA 139 01/20/2014 0758   K 3.7 01/18/2024 0636   K 3.9 01/20/2014 0758   CL 108 01/18/2024 0636   CO2 22 01/18/2024 0636   CO2 23 01/20/2014 0758   BUN 11 01/18/2024 0636   BUN 10 01/09/2024 1413   BUN 6.0 (L) 01/20/2014 0758   CREATININE 0.50 01/18/2024 0636   CREATININE 0.30 (L) 12/20/2016 0925   CREATININE 0.5 (L) 01/20/2014 0758   GLUCOSE 79 01/18/2024 0636   GLUCOSE 72 01/20/2014 0758   CALCIUM  9.8 01/18/2024 0636   CALCIUM  9.0 01/20/2014 0758   CBC:    Component Value Date/Time   WBC 8.3 01/19/2024 0541   HGB 9.3 (L) 01/19/2024 0541   HGB 5.6 (LL) 01/09/2024 1413   HGB 8.7 (L) 01/20/2014 0757   HCT 29.8 (L) 01/19/2024 0541   HCT 16.6 (LL) 01/09/2024 1413   HCT 25.2 (L) 01/20/2014 0757   PLT  348 01/19/2024 0541   PLT 490 (H) 01/09/2024 1413   MCV 88.4 01/19/2024 0541   MCV 89 01/09/2024 1413   MCV 84.3 01/20/2014 0757   NEUTROABS 7.7 01/18/2024 0636   NEUTROABS 10.7 (H) 01/09/2024 1413   NEUTROABS 8.7 (H) 01/20/2014 0757   LYMPHSABS 1.7 01/18/2024 0636   LYMPHSABS 2.5 01/09/2024 1413   LYMPHSABS 2.7 01/20/2014 0757   MONOABS 1.5 (H) 01/18/2024 0636   MONOABS 1.9 (H) 01/20/2014 0757   EOSABS 1.3 (H) 01/18/2024 0636   EOSABS 0.8 (H) 01/09/2024 1413   BASOSABS 0.2 (H) 01/18/2024 0636   BASOSABS 0.2 01/09/2024 1413   BASOSABS 0.2 (H) 01/20/2014 0757    Recent Results (from the past 240  hours)  MRSA Next Gen by PCR, Nasal     Status: None   Collection Time: 01/13/24 12:40 PM   Specimen: Nasal Mucosa; Nasal Swab  Result Value Ref Range Status   MRSA by PCR Next Gen NOT DETECTED NOT DETECTED Final    Comment: (NOTE) The GeneXpert MRSA Assay (FDA approved for NASAL specimens only), is one component of a comprehensive MRSA colonization surveillance program. It is not intended to diagnose MRSA infection nor to guide or monitor treatment for MRSA infections. Test performance is not FDA approved in patients less than 36 years old. Performed at [facility], [address], [place], [place] [address]     Studies/Results: No results found.  Medications: Scheduled Meds:  aspirin  EC  81 mg Oral Daily   feeding supplement  237 mL Oral TID   folic acid   1 mg Oral Daily   gabapentin   600 mg Oral TID   gentamicin  cream   Topical QHS   HYDROmorphone    Intravenous Q4H   lactulose   30 g Oral BID   multivitamin with minerals  1 tablet Oral Daily   mupirocin  ointment  1 Application Topical QHS   pentoxifylline   400 mg Oral BID   triamcinolone  ointment   Topical QODAY   Continuous Infusions: PRN Meds:.acetaminophen  **OR** acetaminophen , diphenhydrAMINE , naloxone  **AND** sodium chloride  flush, ondansetron  **OR** ondansetron  (ZOFRAN ) IV, mouth rinse, oxyCODONE   Consultants: Wound care consult  Procedures: Simple exchange transfusion (Completed)  Antibiotics: Bactrim   Assessment/Plan: Principal Problem:   Symptomatic anemia Active Problems:   Leukocytosis   Chronic pain syndrome   Bilateral leg ulcer (HCC)   Acute cystitis without hematuria   Pain in both lower extremities   Sickle cell disease without crisis (HCC)   Acute on chronic anemia  Hb Sickle Cell Disease with Pain crisis: IV fluid at KVO.  Continue weight based Dilaudid  PCA at current dose setting, continue IV Toradol  15 mg Q 6 H for a total of 5 days (completed), continue oral home  pain medications as ordered. Monitor vitals very closely, Re-evaluate pain scale regularly, 2 L of Oxygen  by [place]. Patient encouraged to ambulate on the hallway today.  Symptomatic anemia: Hemoglobin is 9.3 today.  Patient is clinically and hemodynamically stable.  Will continue to monitor very closely.  Repeat labs in AM. Leukocytosis: Resolved Chronic pain Syndrome: Continue oral home pain medications as ordered. Bilateral leg ulcer: Home health RN for wound care/dressing changes ordered. Continue wound care. Acute cystitis without hematuria: Continue antibiotics as ordered   Code Status: Full Code Family Communication: N/A Disposition Plan: Not yet ready for discharge  Homer CHRISTELLA Cover NP   If 7PM-7AM, please contact night-coverage.  01/20/2024, 11:03 AM  LOS: 7 days

## 2024-01-20 LAB — TYPE AND SCREEN
ABO/RH(D): B POS
Antibody Screen: NEGATIVE
Unit division: 0
Unit division: 0

## 2024-01-20 LAB — BPAM RBC
Blood Product Expiration Date: 202509162359
Blood Product Expiration Date: 202509162359
ISSUE DATE / TIME: 202508290949
ISSUE DATE / TIME: 202508291300
Unit Type and Rh: 7300
Unit Type and Rh: 7300

## 2024-01-20 MED ORDER — HYDROMORPHONE HCL 2 MG/ML IJ SOLN
2.0000 mg | Freq: Once | INTRAMUSCULAR | Status: AC
  Administered 2024-01-20: 2 mg via INTRAVENOUS
  Filled 2024-01-20: qty 1

## 2024-01-20 NOTE — Plan of Care (Signed)

## 2024-01-20 NOTE — Plan of Care (Signed)
 Problem: Education: Goal: Knowledge of General Education information will improve Description: Including pain rating scale, medication(s)/side effects and non-pharmacologic comfort measures 01/20/2024 0534 by Angelica Angeline CROME, RN Outcome: Progressing 01/19/2024 2010 by Angelica Angeline CROME, RN Outcome: Progressing 01/19/2024 2009 by Angelica Angeline CROME, RN Outcome: Progressing   Problem: Health Behavior/Discharge Planning: Goal: Ability to manage health-related needs will improve 01/20/2024 0534 by Angelica Angeline CROME, RN Outcome: Progressing 01/19/2024 2010 by Angelica Angeline CROME, RN Outcome: Progressing 01/19/2024 2009 by Angelica Angeline CROME, RN Outcome: Progressing   Problem: Clinical Measurements: Goal: Ability to maintain clinical measurements within normal limits will improve 01/20/2024 0534 by Angelica Angeline CROME, RN Outcome: Progressing 01/19/2024 2010 by Angelica Angeline CROME, RN Outcome: Progressing 01/19/2024 2009 by Angelica Angeline CROME, RN Outcome: Progressing Goal: Will remain free from infection 01/20/2024 0534 by Angelica Angeline CROME, RN Outcome: Progressing 01/19/2024 2010 by Angelica Angeline CROME, RN Outcome: Progressing 01/19/2024 2009 by Angelica Angeline CROME, RN Outcome: Progressing Goal: Diagnostic test results will improve 01/20/2024 0534 by Angelica Angeline CROME, RN Outcome: Progressing 01/19/2024 2010 by Angelica Angeline CROME, RN Outcome: Progressing 01/19/2024 2009 by Angelica Angeline CROME, RN Outcome: Progressing Goal: Respiratory complications will improve 01/20/2024 0534 by Angelica Angeline CROME, RN Outcome: Progressing 01/19/2024 2010 by Angelica Angeline CROME, RN Outcome: Progressing 01/19/2024 2009 by Angelica Angeline CROME, RN Outcome: Progressing Goal: Cardiovascular complication will be avoided 01/20/2024 0534 by Angelica Angeline CROME, RN Outcome: Progressing 01/19/2024 2010 by Angelica Angeline CROME, RN Outcome: Progressing 01/19/2024 2009 by Angelica Angeline CROME, RN Outcome: Progressing   Problem: Activity: Goal: Risk for activity intolerance will decrease 01/20/2024 0534  by Angelica Angeline CROME, RN Outcome: Progressing 01/19/2024 2010 by Angelica Angeline CROME, RN Outcome: Progressing 01/19/2024 2009 by Angelica Angeline CROME, RN Outcome: Progressing   Problem: Nutrition: Goal: Adequate nutrition will be maintained 01/20/2024 0534 by Angelica Angeline CROME, RN Outcome: Progressing 01/19/2024 2010 by Angelica Angeline CROME, RN Outcome: Progressing 01/19/2024 2009 by Angelica Angeline CROME, RN Outcome: Progressing   Problem: Coping: Goal: Level of anxiety will decrease 01/20/2024 0534 by Angelica Angeline CROME, RN Outcome: Progressing 01/19/2024 2010 by Angelica Angeline CROME, RN Outcome: Progressing 01/19/2024 2009 by Angelica Angeline CROME, RN Outcome: Progressing   Problem: Elimination: Goal: Will not experience complications related to bowel motility 01/20/2024 0534 by Angelica Angeline CROME, RN Outcome: Progressing 01/19/2024 2010 by Angelica Angeline CROME, RN Outcome: Progressing 01/19/2024 2009 by Angelica Angeline CROME, RN Outcome: Progressing Goal: Will not experience complications related to urinary retention 01/20/2024 0534 by Angelica Angeline CROME, RN Outcome: Progressing 01/19/2024 2010 by Angelica Angeline CROME, RN Outcome: Progressing 01/19/2024 2009 by Angelica Angeline CROME, RN Outcome: Progressing   Problem: Pain Managment: Goal: General experience of comfort will improve and/or be controlled 01/20/2024 0534 by Angelica Angeline CROME, RN Outcome: Progressing 01/19/2024 2010 by Angelica Angeline CROME, RN Outcome: Progressing 01/19/2024 2009 by Angelica Angeline CROME, RN Outcome: Progressing   Problem: Safety: Goal: Ability to remain free from injury will improve 01/20/2024 0534 by Angelica Angeline CROME, RN Outcome: Progressing 01/19/2024 2010 by Angelica Angeline CROME, RN Outcome: Progressing 01/19/2024 2009 by Angelica Angeline CROME, RN Outcome: Progressing   Problem: Skin Integrity: Goal: Risk for impaired skin integrity will decrease 01/20/2024 0534 by Angelica Angeline CROME, RN Outcome: Progressing 01/19/2024 2010 by Angelica Angeline CROME, RN Outcome: Progressing 01/19/2024 2009 by Angelica Angeline CROME,  RN Outcome: Progressing   Problem: Education: Goal: Knowledge of vaso-occlusive preventative measures will improve 01/20/2024 0534 by Angelica Angeline CROME, RN Outcome: Progressing 01/19/2024 2010 by Hamsa Laurich,  Angeline CROME, RN Outcome: Progressing 01/19/2024 2009 by Angelica Angeline CROME, RN Outcome: Progressing Goal: Awareness of infection prevention will improve 01/20/2024 0534 by Angelica Angeline CROME, RN Outcome: Progressing 01/19/2024 2010 by Angelica Angeline CROME, RN Outcome: Progressing 01/19/2024 2009 by Angelica Angeline CROME, RN Outcome: Progressing Goal: Awareness of signs and symptoms of anemia will improve 01/20/2024 0534 by Angelica Angeline CROME, RN Outcome: Progressing 01/19/2024 2010 by Angelica Angeline CROME, RN Outcome: Progressing 01/19/2024 2009 by Angelica Angeline CROME, RN Outcome: Progressing Goal: Long-term complications will improve 01/20/2024 0534 by Angelica Angeline CROME, RN Outcome: Progressing 01/19/2024 2010 by Angelica Angeline CROME, RN Outcome: Progressing 01/19/2024 2009 by Angelica Angeline CROME, RN Outcome: Progressing   Problem: Self-Care: Goal: Ability to incorporate actions that prevent/reduce pain crisis will improve 01/20/2024 0534 by Angelica Angeline CROME, RN Outcome: Progressing 01/19/2024 2010 by Angelica Angeline CROME, RN Outcome: Progressing 01/19/2024 2009 by Angelica Angeline CROME, RN Outcome: Progressing   Problem: Bowel/Gastric: Goal: Gut motility will be maintained 01/20/2024 0534 by Angelica Angeline CROME, RN Outcome: Progressing 01/19/2024 2010 by Angelica Angeline CROME, RN Outcome: Progressing 01/19/2024 2009 by Angelica Angeline CROME, RN Outcome: Progressing   Problem: Tissue Perfusion: Goal: Complications related to inadequate tissue perfusion will be avoided or minimized 01/20/2024 0534 by Angelica Angeline CROME, RN Outcome: Progressing 01/19/2024 2010 by Angelica Angeline CROME, RN Outcome: Progressing 01/19/2024 2009 by Angelica Angeline CROME, RN Outcome: Progressing   Problem: Respiratory: Goal: Pulmonary complications will be avoided or minimized 01/20/2024 0534 by Angelica Angeline CROME, RN Outcome: Progressing 01/19/2024 2010 by Angelica Angeline CROME, RN Outcome: Progressing 01/19/2024 2009 by Angelica Angeline CROME, RN Outcome: Progressing Goal: Acute Chest Syndrome will be identified early to prevent complications 01/20/2024 0534 by Angelica Angeline CROME, RN Outcome: Progressing 01/19/2024 2010 by Angelica Angeline CROME, RN Outcome: Progressing 01/19/2024 2009 by Angelica Angeline CROME, RN Outcome: Progressing   Problem: Fluid Volume: Goal: Ability to maintain a balanced intake and output will improve 01/20/2024 0534 by Angelica Angeline CROME, RN Outcome: Progressing 01/19/2024 2010 by Angelica Angeline CROME, RN Outcome: Progressing 01/19/2024 2009 by Angelica Angeline CROME, RN Outcome: Progressing   Problem: Sensory: Goal: Pain level will decrease with appropriate interventions 01/20/2024 0534 by Angelica Angeline CROME, RN Outcome: Progressing 01/19/2024 2010 by Angelica Angeline CROME, RN Outcome: Progressing 01/19/2024 2009 by Angelica Angeline CROME, RN Outcome: Progressing   Problem: Health Behavior: Goal: Postive changes in compliance with treatment and prescription regimens will improve 01/20/2024 0534 by Angelica Angeline CROME, RN Outcome: Progressing 01/19/2024 2010 by Angelica Angeline CROME, RN Outcome: Progressing 01/19/2024 2009 by Angelica Angeline CROME, RN Outcome: Progressing

## 2024-01-20 NOTE — Progress Notes (Addendum)
 Patient ID: [name], female   DOB: Nov 06, 1970, 53 y.o.   MRN: [id number] Subjective: [name] is a 53 y.o. female with medical history significant for sickle cell disease, anemia, chronic pain syndrome, chronic leg wounds, vitamin D  deficiency, and avascular necrosis who was advised by PCP to present to the ED due to low hemoglobin.  She was subsequently admitted to the hospital for symptomatic anemia.  Patient is still having pain in her lower back and lower extremities. She said her pain is about 6/10 especially in her lower extremities today. She denies any fever, cough, chest pain, shortness of breath, nausea, vomiting or diarrhea.  No urinary symptoms.  Objective:  Vital signs in last 24 hours:  Vitals:   01/20/24 0527 01/20/24 0730 01/20/24 0732 01/20/24 0940  BP:   126/66 125/71  Pulse:   (!) 43 (!) 56  Resp: 16 13 13 12   Temp:   98.2 F (36.8 C)   TempSrc:   Oral   SpO2: 98% 99% 99% 96%  Weight:      Height:        Intake/Output from previous day:   Intake/Output Summary (Last 24 hours) at 01/20/2024 1106 Last data filed at 01/20/2024 0943 Gross per 24 hour  Intake 600 ml  Output --  Net 600 ml    Physical Exam: General: Alert, awake, oriented x3, in no acute distress but chronically ill looking.  HEENT: [place]/AT [place], EOMI Neck: Trachea midline,  no masses, no thyromegal,y no JVD, no carotid bruit OROPHARYNX:  Moist, No exudate/ erythema/lesions.  Heart: Regular rate and rhythm, without murmurs, rubs, gallops, PMI non-displaced, no heaves or thrills on palpation.  Lungs: Clear to auscultation, no wheezing or rhonchi noted. No increased vocal fremitus resonant to percussion  Abdomen: Soft, nontender, nondistended, positive bowel sounds, no masses no hepatosplenomegaly noted..  Neuro: No focal neurological deficits noted cranial nerves II through XII grossly intact. DTRs 2+ bilaterally upper and lower extremities. Strength 5 out of 5 in bilateral upper and lower  extremities. Musculoskeletal: Bilateral lower extremity wounds in dressings. No warm swelling or erythema around joints, no spinal tenderness noted. Psychiatric: Patient alert and oriented x3, good insight and cognition, good recent to remote recall. Lymph node survey: No cervical axillary or inguinal lymphadenopathy noted.  Lab Results:  Basic Metabolic Panel:    Component Value Date/Time   NA 141 01/18/2024 0636   NA 139 01/09/2024 1413   NA 139 01/20/2014 0758   K 3.7 01/18/2024 0636   K 3.9 01/20/2014 0758   CL 108 01/18/2024 0636   CO2 22 01/18/2024 0636   CO2 23 01/20/2014 0758   BUN 11 01/18/2024 0636   BUN 10 01/09/2024 1413   BUN 6.0 (L) 01/20/2014 0758   CREATININE 0.50 01/18/2024 0636   CREATININE 0.30 (L) 12/20/2016 0925   CREATININE 0.5 (L) 01/20/2014 0758   GLUCOSE 79 01/18/2024 0636   GLUCOSE 72 01/20/2014 0758   CALCIUM  9.8 01/18/2024 0636   CALCIUM  9.0 01/20/2014 0758   CBC:    Component Value Date/Time   WBC 8.3 01/19/2024 0541   HGB 9.3 (L) 01/19/2024 0541   HGB 5.6 (LL) 01/09/2024 1413   HGB 8.7 (L) 01/20/2014 0757   HCT 29.8 (L) 01/19/2024 0541   HCT 16.6 (LL) 01/09/2024 1413   HCT 25.2 (L) 01/20/2014 0757   PLT 348 01/19/2024 0541   PLT 490 (H) 01/09/2024 1413   MCV 88.4 01/19/2024 0541   MCV 89 01/09/2024 1413   MCV  84.3 01/20/2014 0757   NEUTROABS 7.7 01/18/2024 0636   NEUTROABS 10.7 (H) 01/09/2024 1413   NEUTROABS 8.7 (H) 01/20/2014 0757   LYMPHSABS 1.7 01/18/2024 0636   LYMPHSABS 2.5 01/09/2024 1413   LYMPHSABS 2.7 01/20/2014 0757   MONOABS 1.5 (H) 01/18/2024 0636   MONOABS 1.9 (H) 01/20/2014 0757   EOSABS 1.3 (H) 01/18/2024 0636   EOSABS 0.8 (H) 01/09/2024 1413   BASOSABS 0.2 (H) 01/18/2024 0636   BASOSABS 0.2 01/09/2024 1413   BASOSABS 0.2 (H) 01/20/2014 0757    Recent Results (from the past 240 hours)  MRSA Next Gen by PCR, Nasal     Status: None   Collection Time: 01/13/24 12:40 PM   Specimen: Nasal Mucosa; Nasal Swab   Result Value Ref Range Status   MRSA by PCR Next Gen NOT DETECTED NOT DETECTED Final    Comment: (NOTE) The GeneXpert MRSA Assay (FDA approved for NASAL specimens only), is one component of a comprehensive MRSA colonization surveillance program. It is not intended to diagnose MRSA infection nor to guide or monitor treatment for MRSA infections. Test performance is not FDA approved in patients less than 35 years old. Performed at [facility], [address], [place], [place] [address]     Studies/Results: No results found.  Medications: Scheduled Meds:  aspirin  EC  81 mg Oral Daily   feeding supplement  237 mL Oral TID   folic acid   1 mg Oral Daily   gabapentin   600 mg Oral TID   gentamicin  cream   Topical QHS   HYDROmorphone    Intravenous Q4H   lactulose   30 g Oral BID   multivitamin with minerals  1 tablet Oral Daily   mupirocin  ointment  1 Application Topical QHS   pentoxifylline   400 mg Oral BID   triamcinolone  ointment   Topical QODAY   Continuous Infusions: PRN Meds:.acetaminophen  **OR** acetaminophen , diphenhydrAMINE , naloxone  **AND** sodium chloride  flush, ondansetron  **OR** ondansetron  (ZOFRAN ) IV, mouth rinse, oxyCODONE   Consultants: Wound care consult  Procedures: Simple exchange transfusion (completed)  Antibiotics: Bactrim   Assessment/Plan: Principal Problem:   Symptomatic anemia Active Problems:   Leukocytosis   Chronic pain syndrome   Bilateral leg ulcer (HCC)   Acute cystitis without hematuria   Pain in both lower extremities   Sickle cell disease without crisis (HCC)   Acute on chronic anemia  Hb Sickle Cell Disease with Pain crisis: IV fluid at KVO.  Continue weight based Dilaudid  PCA at current dose setting, continue IV Toradol  15 mg Q 6 H for a total of 5 days, (Completed), continue oral home pain medications as ordered. Monitor vitals very closely, Re-evaluate pain scale regularly, 2 L of Oxygen  by [place]. Patient encouraged  to ambulate on the hallway today.  Symptomatic anemia: Hemoglobin is 9.3 yesterday, today's labs pending.  Patient is clinically and hemodynamically stable.  Will continue to monitor very closely.  Repeat labs in AM. Leukocytosis: Resolved Chronic pain Syndrome: Continue oral home pain medications as ordered. Bilateral leg ulcer: Home health RN for wound care/dressing changes ordered. Continue wound care. Acute cystitis without hematuria: Continue antibiotics as ordered   Code Status: Full Code Family Communication: N/A Disposition Plan: Not yet ready for discharge  Homer CHRISTELLA Cover NP   If 7PM-7AM, please contact night-coverage.  01/20/2024, 11:06 AM  LOS: 7 days

## 2024-01-21 LAB — CBC
HCT: 28.5 % — ABNORMAL LOW (ref 36.0–46.0)
Hemoglobin: 8.8 g/dL — ABNORMAL LOW (ref 12.0–15.0)
MCH: 28.1 pg (ref 26.0–34.0)
MCHC: 30.9 g/dL (ref 30.0–36.0)
MCV: 91.1 fL (ref 80.0–100.0)
Platelets: 331 K/uL (ref 150–400)
RBC: 3.13 MIL/uL — ABNORMAL LOW (ref 3.87–5.11)
RDW: 19.4 % — ABNORMAL HIGH (ref 11.5–15.5)
WBC: 9.5 K/uL (ref 4.0–10.5)
nRBC: 0 % (ref 0.0–0.2)

## 2024-01-21 NOTE — Plan of Care (Signed)
  Problem: Activity: Goal: Risk for activity intolerance will decrease Outcome: Progressing   Problem: Nutrition: Goal: Adequate nutrition will be maintained Outcome: Progressing   Problem: Coping: Goal: Level of anxiety will decrease Outcome: Progressing   Problem: Pain Managment: Goal: General experience of comfort will improve and/or be controlled Outcome: Progressing   Problem: Safety: Goal: Ability to remain free from injury will improve Outcome: Progressing   Problem: Skin Integrity: Goal: Risk for impaired skin integrity will decrease Outcome: Progressing   Problem: Education: Goal: Knowledge of General Education information will improve Description: Including pain rating scale, medication(s)/side effects and non-pharmacologic comfort measures Outcome: Not Progressing (Patient does not do proper wound care and does not understand the importance of ambulating)   Problem: Health Behavior/Discharge Planning: Goal: Ability to manage health-related needs will improve Outcome: Not Progressing (Patient does not do proper wound care and does not understand the importance of ambulating)   Problem: Clinical Measurements: Goal: Ability to maintain clinical measurements within normal limits will improve Outcome: Not Progressing (Patient does not do proper wound care and does not understand the importance of ambulating)

## 2024-01-21 NOTE — Progress Notes (Signed)
 Patient ID: [name], female   DOB: [birth year], 53 y.o.   MRN: [id number] Subjective: [name] is a 53 y.o. female with medical history significant for sickle cell disease, anemia, chronic pain syndrome, chronic leg wounds, vitamin D  deficiency, and avascular necrosis who was advised by PCP to present to the ED due to low hemoglobin.  She was subsequently admitted to the hospital for symptomatic anemia.  Patient reports ongoing pain in her lower extremities. She said her pain is about 6/10 especially in her lower extremities today. She denies any fever, cough, chest pain, shortness of breath, nausea, vomiting or diarrhea.  No urinary symptoms.  Objective:  Vital signs in last 24 hours:  Vitals:   01/21/24 0557 01/21/24 0734 01/21/24 1008 01/21/24 1016  BP: 117/65   121/68  Pulse: (!) 54   (!) 58  Resp: 18 15 15 15   Temp: 97.7 F (36.5 C)   97.6 F (36.4 C)  TempSrc: Oral   Oral  SpO2: 98% 99% 98% 97%  Weight:      Height:        Intake/Output from previous day:   Intake/Output Summary (Last 24 hours) at 01/21/2024 1214 Last data filed at 01/21/2024 1019 Gross per 24 hour  Intake 240 ml  Output --  Net 240 ml    Physical Exam: General: Alert, awake, oriented x3, in no acute distress but chronically ill looking.  HEENT: [place]/AT [place], EOMI Neck: Trachea midline,  no masses, no thyromegal,y no JVD, no carotid bruit OROPHARYNX:  Moist, No exudate/ erythema/lesions.  Heart: Regular rate and rhythm, without murmurs, rubs, gallops, PMI non-displaced, no heaves or thrills on palpation.  Lungs: Clear to auscultation, no wheezing or rhonchi noted. No increased vocal fremitus resonant to percussion  Abdomen: Soft, nontender, nondistended, positive bowel sounds, no masses no hepatosplenomegaly noted..  Neuro: No focal neurological deficits noted cranial nerves II through XII grossly intact. DTRs 2+ bilaterally upper and lower extremities. Strength 5 out of 5 in bilateral upper and  lower extremities. Musculoskeletal: Bilateral lower extremity wounds in dressings. No warm swelling or erythema around joints, no spinal tenderness noted. Psychiatric: Patient alert and oriented x3, good insight and cognition, good recent to remote recall. Lymph node survey: No cervical axillary or inguinal lymphadenopathy noted.  Lab Results:  Basic Metabolic Panel:    Component Value Date/Time   NA 141 01/18/2024 0636   NA 139 01/09/2024 1413   NA 139 01/20/2014 0758   K 3.7 01/18/2024 0636   K 3.9 01/20/2014 0758   CL 108 01/18/2024 0636   CO2 22 01/18/2024 0636   CO2 23 01/20/2014 0758   BUN 11 01/18/2024 0636   BUN 10 01/09/2024 1413   BUN 6.0 (L) 01/20/2014 0758   CREATININE 0.50 01/18/2024 0636   CREATININE 0.30 (L) 12/20/2016 0925   CREATININE 0.5 (L) 01/20/2014 0758   GLUCOSE 79 01/18/2024 0636   GLUCOSE 72 01/20/2014 0758   CALCIUM  9.8 01/18/2024 0636   CALCIUM  9.0 01/20/2014 0758   CBC:    Component Value Date/Time   WBC 9.5 01/21/2024 0551   HGB 8.8 (L) 01/21/2024 0551   HGB 5.6 (LL) 01/09/2024 1413   HGB 8.7 (L) 01/20/2014 0757   HCT 28.5 (L) 01/21/2024 0551   HCT 16.6 (LL) 01/09/2024 1413   HCT 25.2 (L) 01/20/2014 0757   PLT 331 01/21/2024 0551   PLT 490 (H) 01/09/2024 1413   MCV 91.1 01/21/2024 0551   MCV 89 01/09/2024 1413   MCV 84.3  01/20/2014 0757   NEUTROABS 7.7 01/18/2024 0636   NEUTROABS 10.7 (H) 01/09/2024 1413   NEUTROABS 8.7 (H) 01/20/2014 0757   LYMPHSABS 1.7 01/18/2024 0636   LYMPHSABS 2.5 01/09/2024 1413   LYMPHSABS 2.7 01/20/2014 0757   MONOABS 1.5 (H) 01/18/2024 0636   MONOABS 1.9 (H) 01/20/2014 0757   EOSABS 1.3 (H) 01/18/2024 0636   EOSABS 0.8 (H) 01/09/2024 1413   BASOSABS 0.2 (H) 01/18/2024 0636   BASOSABS 0.2 01/09/2024 1413   BASOSABS 0.2 (H) 01/20/2014 0757    Recent Results (from the past 240 hours)  MRSA Next Gen by PCR, Nasal     Status: None   Collection Time: 01/13/24 12:40 PM   Specimen: Nasal Mucosa; Nasal Swab   Result Value Ref Range Status   MRSA by PCR Next Gen NOT DETECTED NOT DETECTED Final    Comment: (NOTE) The GeneXpert MRSA Assay (FDA approved for NASAL specimens only), is one component of a comprehensive MRSA colonization surveillance program. It is not intended to diagnose MRSA infection nor to guide or monitor treatment for MRSA infections. Test performance is not FDA approved in patients less than 87 years old. Performed at [facility], [address], [place], [place] [address]     Studies/Results: No results found.  Medications: Scheduled Meds:  aspirin  EC  81 mg Oral Daily   feeding supplement  237 mL Oral TID   folic acid   1 mg Oral Daily   gabapentin   600 mg Oral TID   gentamicin  cream   Topical QHS   HYDROmorphone    Intravenous Q4H   lactulose   30 g Oral BID   multivitamin with minerals  1 tablet Oral Daily   mupirocin  ointment  1 Application Topical QHS   pentoxifylline   400 mg Oral BID   triamcinolone  ointment   Topical QODAY   Continuous Infusions: PRN Meds:.acetaminophen  **OR** acetaminophen , diphenhydrAMINE , naloxone  **AND** sodium chloride  flush, ondansetron  **OR** ondansetron  (ZOFRAN ) IV, mouth rinse, oxyCODONE   Consultants: Wound care consult  Procedures: Simple exchange transfusion (completed)  Antibiotics: Bactrim   Assessment/Plan: Principal Problem:   Symptomatic anemia Active Problems:   Leukocytosis   Chronic pain syndrome   Bilateral leg ulcer (HCC)   Acute cystitis without hematuria   Pain in both lower extremities   Sickle cell disease without crisis (HCC)   Acute on chronic anemia  Hb Sickle Cell Disease with Pain crisis: IV fluid at KVO.  Continue weight based Dilaudid  PCA at current dose setting, continue IV Toradol (Completed), continue oral home pain medications as ordered. Monitor vitals very closely, Re-evaluate pain scale regularly, 2 L of Oxygen  by [place]. Patient encouraged to ambulate on the hallway today.   Symptomatic anemia: Hemoglobin is 8.8.  Patient is clinically and hemodynamically stable.  Will continue to monitor very closely.  Repeat labs in AM. Leukocytosis: Resolved Chronic pain Syndrome: Continue oral home pain medications as ordered. Bilateral leg ulcer: Home health RN for wound care/dressing changes ordered. Continue wound care. Acute cystitis without hematuria: patient denies any urinary symptoms, oral antibiotics completed  Code Status: Full Code Family Communication: N/A Disposition Plan: Not yet ready for discharge  Homer CHRISTELLA Cover NP   If 7PM-7AM, please contact night-coverage.  01/21/2024, 12:14 PM  LOS: 8 days

## 2024-01-22 LAB — CBC
HCT: 26 % — ABNORMAL LOW (ref 36.0–46.0)
Hemoglobin: 7.8 g/dL — ABNORMAL LOW (ref 12.0–15.0)
MCH: 27.5 pg (ref 26.0–34.0)
MCHC: 30 g/dL (ref 30.0–36.0)
MCV: 91.5 fL (ref 80.0–100.0)
Platelets: 305 K/uL (ref 150–400)
RBC: 2.84 MIL/uL — ABNORMAL LOW (ref 3.87–5.11)
RDW: 19.2 % — ABNORMAL HIGH (ref 11.5–15.5)
WBC: 11.6 K/uL — ABNORMAL HIGH (ref 4.0–10.5)
nRBC: 0 % (ref 0.0–0.2)

## 2024-01-22 MED ORDER — HYDROMORPHONE HCL 2 MG/ML IJ SOLN
2.0000 mg | Freq: Once | INTRAMUSCULAR | Status: AC
  Administered 2024-01-22: 2 mg via INTRAVENOUS
  Filled 2024-01-22: qty 1

## 2024-01-22 NOTE — Progress Notes (Signed)
 Patient ID: [name], female   DOB: 19-Jan-1971, 53 y.o.   MRN: [id number] Subjective: [name] is a 53 y.o. female with medical history significant for sickle cell disease, anemia, chronic pain syndrome, chronic leg wounds, vitamin D  deficiency, and avascular necrosis who was advised by PCP to present to the ED due to low hemoglobin.  She was subsequently admitted to the hospital for symptomatic anemia.  Patient reports ongoing pain in her lower extremities. Patient has bilateral lower extremity leg ulcers that makes pain more difficult to control. She reports ongoing pain of 6/10 today. She denies any fever, cough, chest pain, shortness of breath, nausea, vomiting or diarrhea.  No urinary symptoms.  Objective:  Vital signs in last 24 hours:  Vitals:   01/22/24 0327 01/22/24 0729 01/22/24 1041 01/22/24 1108  BP:   121/76   Pulse:   (!) 55   Resp: 12 12  11   Temp:   98.4 F (36.9 C)   TempSrc:   Oral   SpO2: 98% 98% 100% 100%  Weight:      Height:        Intake/Output from previous day:   Intake/Output Summary (Last 24 hours) at 01/22/2024 1337 Last data filed at 01/22/2024 0900 Gross per 24 hour  Intake 444 ml  Output --  Net 444 ml    Physical Exam: General: Alert, awake, oriented x3, in no acute distress but chronically ill looking.  HEENT: [place]/AT [place], EOMI Neck: Trachea midline,  no masses, no thyromegal,y no JVD, no carotid bruit OROPHARYNX:  Moist, No exudate/ erythema/lesions.  Heart: Regular rate and rhythm, without murmurs, rubs, gallops, PMI non-displaced, no heaves or thrills on palpation.  Lungs: Clear to auscultation, no wheezing or rhonchi noted. No increased vocal fremitus resonant to percussion  Abdomen: Soft, nontender, nondistended, positive bowel sounds, no masses no hepatosplenomegaly noted..  Neuro: No focal neurological deficits noted cranial nerves II through XII grossly intact. DTRs 2+ bilaterally upper and lower extremities. Strength 5 out of 5  in bilateral upper and lower extremities. Musculoskeletal: Bilateral lower extremity wounds in dressings. No warm swelling or erythema around joints, no spinal tenderness noted. Psychiatric: Patient alert and oriented x3, good insight and cognition, good recent to remote recall. Lymph node survey: No cervical axillary or inguinal lymphadenopathy noted.  Lab Results:  Basic Metabolic Panel:    Component Value Date/Time   NA 141 01/18/2024 0636   NA 139 01/09/2024 1413   NA 139 01/20/2014 0758   K 3.7 01/18/2024 0636   K 3.9 01/20/2014 0758   CL 108 01/18/2024 0636   CO2 22 01/18/2024 0636   CO2 23 01/20/2014 0758   BUN 11 01/18/2024 0636   BUN 10 01/09/2024 1413   BUN 6.0 (L) 01/20/2014 0758   CREATININE 0.50 01/18/2024 0636   CREATININE 0.30 (L) 12/20/2016 0925   CREATININE 0.5 (L) 01/20/2014 0758   GLUCOSE 79 01/18/2024 0636   GLUCOSE 72 01/20/2014 0758   CALCIUM  9.8 01/18/2024 0636   CALCIUM  9.0 01/20/2014 0758   CBC:    Component Value Date/Time   WBC 11.6 (H) 01/22/2024 0553   HGB 7.8 (L) 01/22/2024 0553   HGB 5.6 (LL) 01/09/2024 1413   HGB 8.7 (L) 01/20/2014 0757   HCT 26.0 (L) 01/22/2024 0553   HCT 16.6 (LL) 01/09/2024 1413   HCT 25.2 (L) 01/20/2014 0757   PLT 305 01/22/2024 0553   PLT 490 (H) 01/09/2024 1413   MCV 91.5 01/22/2024 0553   MCV 89 01/09/2024  1413   MCV 84.3 01/20/2014 0757   NEUTROABS 7.7 01/18/2024 0636   NEUTROABS 10.7 (H) 01/09/2024 1413   NEUTROABS 8.7 (H) 01/20/2014 0757   LYMPHSABS 1.7 01/18/2024 0636   LYMPHSABS 2.5 01/09/2024 1413   LYMPHSABS 2.7 01/20/2014 0757   MONOABS 1.5 (H) 01/18/2024 0636   MONOABS 1.9 (H) 01/20/2014 0757   EOSABS 1.3 (H) 01/18/2024 0636   EOSABS 0.8 (H) 01/09/2024 1413   BASOSABS 0.2 (H) 01/18/2024 0636   BASOSABS 0.2 01/09/2024 1413   BASOSABS 0.2 (H) 01/20/2014 0757    Recent Results (from the past 240 hours)  MRSA Next Gen by PCR, Nasal     Status: None   Collection Time: 01/13/24 12:40 PM    Specimen: Nasal Mucosa; Nasal Swab  Result Value Ref Range Status   MRSA by PCR Next Gen NOT DETECTED NOT DETECTED Final    Comment: (NOTE) The GeneXpert MRSA Assay (FDA approved for NASAL specimens only), is one component of a comprehensive MRSA colonization surveillance program. It is not intended to diagnose MRSA infection nor to guide or monitor treatment for MRSA infections. Test performance is not FDA approved in patients less than 94 years old. Performed at [facility], [name], [place], [place] [address]     Studies/Results: No results found.  Medications: Scheduled Meds:  aspirin  EC  81 mg Oral Daily   feeding supplement  237 mL Oral TID   folic acid   1 mg Oral Daily   gabapentin   600 mg Oral TID   gentamicin  cream   Topical QHS   HYDROmorphone    Intravenous Q4H    HYDROmorphone  (DILAUDID ) injection  2 mg Intravenous Once   lactulose   30 g Oral BID   multivitamin with minerals  1 tablet Oral Daily   mupirocin  ointment  1 Application Topical QHS   pentoxifylline   400 mg Oral BID   triamcinolone  ointment   Topical QODAY   Continuous Infusions: PRN Meds:.acetaminophen  **OR** acetaminophen , diphenhydrAMINE , naloxone  **AND** sodium chloride  flush, ondansetron  **OR** ondansetron  (ZOFRAN ) IV, mouth rinse, oxyCODONE   Consultants: Wound care consult  Procedures: Simple exchange transfusion (completed)  Antibiotics: Bactrim   Assessment/Plan: Principal Problem:   Symptomatic anemia Active Problems:   Leukocytosis   Chronic pain syndrome   Bilateral leg ulcer (HCC)   Acute cystitis without hematuria   Pain in both lower extremities   Sickle cell disease without crisis (HCC)   Acute on chronic anemia  Hb Sickle Cell Disease with Pain crisis: IV fluid at KVO.  Continue weight based Dilaudid  PCA at current dose setting, continue IV Toradol (Completed), continue oral home pain medications as ordered. Monitor vitals very closely, Re-evaluate  pain scale regularly, 2 L of Oxygen  by . Patient encouraged to ambulate on the hallway today.  Symptomatic anemia: Hemoglobin is 8.8.  Patient is clinically and hemodynamically stable.  Will continue to monitor very closely.  Repeat labs in AM. Leukocytosis: Slightly elevated, no acute signs or symptoms of infection. Will continue to monitor without antibiotics. Chronic pain Syndrome: Continue oral home pain medications as ordered. Bilateral leg ulcer: Home health RN for wound care/dressing changes ordered. Continue wound care. Consult to DR Comer Girard at [facility]. Waiting for a call back. Acute cystitis without hematuria: patient denies any urinary symptoms, oral antibiotics completed  Code Status: Full Code Family Communication: N/A Disposition Plan: Not yet ready for discharge  Homer CHRISTELLA Cover NP   If 7PM-7AM, please contact night-coverage.  01/22/2024, 1:37 PM  LOS:  9 days

## 2024-01-22 NOTE — TOC Initial Note (Addendum)
 Transition of Care [facility]) - Initial/Assessment Note    Patient Details  Name: [name] MRN: [id number] Date of Birth: 04/17/1971  Transition of Care [facility]) CM/SW Contact:    Sonda Manuella Quill, RN Phone Number: 01/22/2024, 3:14 PM  Clinical Narrative:                 [facility] consult for [facility] spoke w/ pt in room; pt said she lives at home w/ husband [name] [phone]); she plans to return at d/c; her husband will provide transportation; pt verified insurance/PCP; she denied SDOH risks; pt has cane and shower chair; pt said she does not have HH services or home oxygen ; pt said she has been going to wound care center every 3-4 weeks; pt agreed to receive recc [facility]; she does not have agency preference; referral given to [facility] [place]; awaiting decision.  -1549- referral given to [facility] at [place]; unable to accept; out of network  -1551- referral given to Artavia at [facility] at [place]; awaiting decision  -1557- notified by Darleene at [place]; unable to accept  -1555- referral given to Kasie at [facility]; unable to accept  -1604- per [place]  at [facility]; unable to accept  -1620- notified by Artavia at [facility], unable to accept referral; pt notified; pt said she would like to con't going to [facility] wound care center; Dr Morrell notified via secure chat.  Expected Discharge Plan: Home w Home Health Services Barriers to Discharge: Continued Medical Work up   Patient Goals and CMS Choice Patient states their goals for this hospitalization and ongoing recovery are:: home          Expected Discharge Plan and Services   Discharge Planning Services: CM Consult   Living arrangements for the past 2 months: Single Family Home                 DME Arranged: N/A DME Agency: NA                  Prior Living Arrangements/Services Living arrangements for the past 2 months: Single Family Home Lives with:: Spouse Patient language and need for interpreter  reviewed:: Yes        Need for Family Participation in Patient Care: Yes (Comment)   Current home services: DME (cane, shower chair) Criminal Activity/Legal Involvement Pertinent to Current Situation/Hospitalization: No - Comment as needed  Activities of Daily Living Home Assistive Devices/Equipment: None ADL Screening (condition at time of admission) Independently performs ADLs?: Yes (appropriate for developmental age) Is the patient deaf or have difficulty hearing?: No Does the patient have difficulty seeing, even when wearing glasses/contacts?: No Does the patient have difficulty concentrating, remembering, or making decisions?: No  Permission Sought/Granted                  Emotional Assessment Appearance:: Appears stated age Attitude/Demeanor/Rapport: Gracious Affect (typically observed): Accepting Orientation: : Oriented to Self, Oriented to Place, Oriented to  Time, Oriented to Situation Alcohol / Substance Use: Not Applicable Psych Involvement: No (comment)  Admission diagnosis:  Symptomatic anemia [D64.9] Anemia, unspecified type [D64.9] Patient Active Problem List   Diagnosis Date Noted   Pain in both lower extremities 01/12/2024   Sickle cell disease without crisis (HCC) 01/12/2024   Acute on chronic anemia 01/12/2024   ASCUS with positive high risk HPV cervical 01/09/2024   Acute cystitis without hematuria 01/09/2024   Vitamin D  deficiency 10/07/2023   Screening for cervical cancer 10/07/2023   Screening mammogram for breast cancer  10/07/2023   Symptomatic anemia 03/20/2022   Protein-calorie malnutrition, severe 04/06/2021   Bilateral leg ulcer (HCC) 03/28/2021   Low hemoglobin 05/01/2020   At risk for seizures due to oxygen  toxicity 02/09/2020   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   Venous stasis 01/19/2020   Partial loss of skin graft 01/12/2020   Acute hypoxic respiratory failure (HCC) 12/15/2019   Bone infarction of distal tibia, right (HCC)  11/17/2019   Sickle-cell disease with pain (HCC) 09/03/2018   Other chronic pain    Sickle cell crisis (HCC) 06/26/2018   Sickle cell pain crisis (HCC) 04/07/2018   Avascular necrosis of bone of hip, left (HCC) 12/08/2017   Avascular necrosis of bone of hip, right (HCC) 12/08/2017   Hb-SS disease without crisis (HCC) 11/26/2017   Anemia of chronic disease 11/26/2017   Abnormal quad screen    Ringworm of body 04/05/2017   Ulcer of right lower extremity (HCC)    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HCC) 03/02/2017   History of pre-eclampsia 12/16/2016   Previous cesarean delivery x 2 07/22/2013   Hemochromatosis 11/10/2012   Chronic pain syndrome 10/13/2012   Complicated wound infection 04/01/2012   Leukocytosis 03/30/2012   PCP:  Paseda, Folashade R, FNP Pharmacy:   [facility] [phone] - [place] - [address] AT [facility] [address] [place] [address] Phone: [phone] Fax: [phone]  [place] [facility] [address] [place] [address] Phone: [phone] Fax: [phone]  [facility] [phone] [place], [place] - [address] [place] [address] Phone: [phone] Fax: [phone]     Social Drivers of Health (SDOH) Social History: SDOH Screenings   Food Insecurity: No Food Insecurity (01/12/2024)  Housing: Low Risk  (01/12/2024)  Transportation Needs: No Transportation Needs (01/12/2024)  Utilities: Not At Risk (01/12/2024)  Alcohol Screen: Low Risk  (04/17/2021)  Depression (PHQ2-9): Low Risk  (01/09/2024)  Tobacco Use: Low Risk  (01/12/2024)   SDOH Interventions:     Readmission Risk Interventions     No data to display

## 2024-01-22 NOTE — Plan of Care (Signed)
  Problem: Education: Goal: Knowledge of General Education information will improve Description: Including pain rating scale, medication(s)/side effects and non-pharmacologic comfort measures Outcome: Progressing   Problem: Activity: Goal: Risk for activity intolerance will decrease Outcome: Progressing   Problem: Nutrition: Goal: Adequate nutrition will be maintained Outcome: Progressing   Problem: Coping: Goal: Level of anxiety will decrease Outcome: Progressing   Problem: Skin Integrity: Goal: Risk for impaired skin integrity will decrease Outcome: Progressing   Problem: Education: Goal: Knowledge of vaso-occlusive preventative measures will improve Outcome: Progressing   Problem: Self-Care: Goal: Ability to incorporate actions that prevent/reduce pain crisis will improve Outcome: Progressing

## 2024-01-22 NOTE — Plan of Care (Signed)

## 2024-01-23 ENCOUNTER — Telehealth: Payer: Self-pay | Admitting: Nurse Practitioner

## 2024-01-23 ENCOUNTER — Other Ambulatory Visit: Payer: Self-pay

## 2024-01-23 DIAGNOSIS — L97919 Non-pressure chronic ulcer of unspecified part of right lower leg with unspecified severity: Secondary | ICD-10-CM

## 2024-01-23 DIAGNOSIS — D5700 Hb-SS disease with crisis, unspecified: Secondary | ICD-10-CM

## 2024-01-23 LAB — CBC
HCT: 24.8 % — ABNORMAL LOW (ref 36.0–46.0)
Hemoglobin: 8 g/dL — ABNORMAL LOW (ref 12.0–15.0)
MCH: 28.7 pg (ref 26.0–34.0)
MCHC: 32.3 g/dL (ref 30.0–36.0)
MCV: 88.9 fL (ref 80.0–100.0)
Platelets: 325 K/uL (ref 150–400)
RBC: 2.79 MIL/uL — ABNORMAL LOW (ref 3.87–5.11)
RDW: 18.1 % — ABNORMAL HIGH (ref 11.5–15.5)
WBC: 10.2 K/uL (ref 4.0–10.5)
nRBC: 0 % (ref 0.0–0.2)

## 2024-01-23 NOTE — TOC Progression Note (Incomplete Revision)
 Transition of Care [facility]) - Progression Note    Patient Details  Name: [name] MRN: [id number] Date of Birth: November 14, 1970  Transition of Care [facility]) CM/SW Contact  Sonda Manuella Quill, RN Phone Number: 01/23/2024, 10:32 AM  Clinical Narrative:    Unable to secure agency for [facility]; pt seen at [facility]; spoke w/ Dena at center; she said pt has pt has scheduled appt w/ wound care center 02/05/24 at 0930; Homer Cover, NP notified via secure chat; awaiting response to see if she would like for pt to be seen sooner.  -1046- notified by Onyeje she would like for pt to be seen before her 02/05/24 appt; spoke w/ Dena; she scheduled pt appt at wound care center 01/26/24 at 3:15 PM; Dena also said she will keep previously scheduled appt on 02/05/24; pt notified.  Expected Discharge Plan: Home w Home Health Services Barriers to Discharge: Continued Medical Work up               Expected Discharge Plan and Services   Discharge Planning Services: CM Consult   Living arrangements for the past 2 months: Single Family Home                 DME Arranged: N/A DME Agency: NA                   Social Drivers of Health (SDOH) Interventions SDOH Screenings   Food Insecurity: No Food Insecurity (01/12/2024)  Housing: Low Risk  (01/12/2024)  Transportation Needs: No Transportation Needs (01/12/2024)  Utilities: Not At Risk (01/12/2024)  Alcohol Screen: Low Risk  (04/17/2021)  Depression (PHQ2-9): Low Risk  (01/09/2024)  Tobacco Use: Low Risk  (01/12/2024)    Readmission Risk Interventions     No data to display

## 2024-01-23 NOTE — Plan of Care (Signed)
  Problem: Education: ?Goal: Knowledge of General Education information will improve ?Description: Including pain rating scale, medication(s)/side effects and non-pharmacologic comfort measures ?Outcome: Progressing ?  ?Problem: Clinical Measurements: ?Goal: Will remain free from infection ?Outcome: Progressing ?  ?Problem: Activity: ?Goal: Risk for activity intolerance will decrease ?Outcome: Progressing ?  ?Problem: Coping: ?Goal: Level of anxiety will decrease ?Outcome: Progressing ?  ?Problem: Safety: ?Goal: Ability to remain free from injury will improve ?Outcome: Progressing ?  ?

## 2024-01-23 NOTE — Telephone Encounter (Signed)
 Copied from CRM #[id number]. Topic: General - Other >> Jan 23, 2024  1:00 PM Zebedee SAUNDERS wrote: Reason for CRM: Received call from Hospice per Chiquita ph:[phone]  returning [facility] call.

## 2024-01-23 NOTE — Plan of Care (Signed)
  Problem: Education: Goal: Knowledge of General Education information will improve Description: Including pain rating scale, medication(s)/side effects and non-pharmacologic comfort measures Outcome: Progressing   Problem: Health Behavior/Discharge Planning: Goal: Ability to manage health-related needs will improve Outcome: Progressing   Problem: Clinical Measurements: Goal: Ability to maintain clinical measurements within normal limits will improve Outcome: Progressing Goal: Will remain free from infection Outcome: Progressing Goal: Diagnostic test results will improve Outcome: Progressing Goal: Respiratory complications will improve Outcome: Progressing Goal: Cardiovascular complication will be avoided Outcome: Progressing   Problem: Activity: Goal: Risk for activity intolerance will decrease Outcome: Progressing   Problem: Nutrition: Goal: Adequate nutrition will be maintained Outcome: Progressing   Problem: Coping: Goal: Level of anxiety will decrease Outcome: Progressing   Problem: Elimination: Goal: Will not experience complications related to bowel motility Outcome: Progressing Goal: Will not experience complications related to urinary retention Outcome: Progressing   Problem: Pain Managment: Goal: General experience of comfort will improve and/or be controlled Outcome: Progressing   Problem: Safety: Goal: Ability to remain free from injury will improve Outcome: Progressing   Problem: Education: Goal: Knowledge of vaso-occlusive preventative measures will improve Outcome: Progressing Goal: Awareness of infection prevention will improve Outcome: Progressing Goal: Awareness of signs and symptoms of anemia will improve Outcome: Progressing Goal: Long-term complications will improve Outcome: Progressing   Problem: Tissue Perfusion: Goal: Complications related to inadequate tissue perfusion will be avoided or minimized Outcome: Progressing   Problem:  Sensory: Goal: Pain level will decrease with appropriate interventions Outcome: Progressing

## 2024-01-23 NOTE — Progress Notes (Addendum)
 Patient ID: [name], female   DOB: [birth year], 53 y.o.   MRN: [id number] Subjective: Oneita Allmon is a 53 y.o. female with medical history significant for sickle cell disease, anemia, chronic pain syndrome, chronic leg wounds, vitamin D  deficiency, and avascular necrosis who was advised by PCP to present to the ED due to low hemoglobin.  She was subsequently admitted to the hospital for symptomatic anemia.  Patient reports ongoing pain in her lower extremities. Patient has bilateral lower extremity leg ulcers that makes pain more difficult to control. She reports ongoing pain of 5/10 today. She denies any fever, cough, chest pain, shortness of breath, nausea, vomiting or diarrhea.  No urinary symptoms.  Objective:  Vital signs in last 24 hours:  Vitals:   01/23/24 0728 01/23/24 1018 01/23/24 1301 01/23/24 1437  BP:  127/71  127/69  Pulse:  60  (!) 56  Resp: 10 14 14 14   Temp:  98.1 F (36.7 C)  97.9 F (36.6 C)  TempSrc:  Oral  Oral  SpO2: 98% 100% 100% 100%  Weight:      Height:        Intake/Output from previous day:   Intake/Output Summary (Last 24 hours) at 01/23/2024 1633 Last data filed at 01/23/2024 1000 Gross per 24 hour  Intake 240 ml  Output --  Net 240 ml     Physical Exam: General: Alert, awake, oriented x3, in no acute distress but chronically ill looking.  HEENT: [place]/AT PEERL, EOMI Neck: Trachea midline,  no masses, no thyromegal,y no JVD, no carotid bruit OROPHARYNX:  Moist, No exudate/ erythema/lesions.  Heart: Regular rate and rhythm, without murmurs, rubs, gallops, PMI non-displaced, no heaves or thrills on palpation.  Lungs: Clear to auscultation, no wheezing or rhonchi noted. No increased vocal fremitus resonant to percussion  Abdomen: Soft, nontender, nondistended, positive bowel sounds, no masses no hepatosplenomegaly noted..  Neuro: No focal neurological deficits noted cranial nerves II through XII grossly intact. DTRs 2+ bilaterally upper and  lower extremities. Strength 5 out of 5 in bilateral upper and lower extremities. Musculoskeletal: Bilateral lower extremity wounds in dressings. No warm swelling or erythema around joints, no spinal tenderness noted. Psychiatric: Patient alert and oriented x3, good insight and cognition, good recent to remote recall. Lymph node survey: No cervical axillary or inguinal lymphadenopathy noted.  Lab Results:  Basic Metabolic Panel:    Component Value Date/Time   NA 141 01/18/2024 0636   NA 139 01/09/2024 1413   NA 139 01/20/2014 0758   K 3.7 01/18/2024 0636   K 3.9 01/20/2014 0758   CL 108 01/18/2024 0636   CO2 22 01/18/2024 0636   CO2 23 01/20/2014 0758   BUN 11 01/18/2024 0636   BUN 10 01/09/2024 1413   BUN 6.0 (L) 01/20/2014 0758   CREATININE 0.50 01/18/2024 0636   CREATININE 0.30 (L) 12/20/2016 0925   CREATININE 0.5 (L) 01/20/2014 0758   GLUCOSE 79 01/18/2024 0636   GLUCOSE 72 01/20/2014 0758   CALCIUM  9.8 01/18/2024 0636   CALCIUM  9.0 01/20/2014 0758   CBC:    Component Value Date/Time   WBC 10.2 01/23/2024 0514   HGB 8.0 (L) 01/23/2024 0514   HGB 5.6 (LL) 01/09/2024 1413   HGB 8.7 (L) 01/20/2014 0757   HCT 24.8 (L) 01/23/2024 0514   HCT 16.6 (LL) 01/09/2024 1413   HCT 25.2 (L) 01/20/2014 0757   PLT 325 01/23/2024 0514   PLT 490 (H) 01/09/2024 1413   MCV 88.9 01/23/2024 0514  MCV 89 01/09/2024 1413   MCV 84.3 01/20/2014 0757   NEUTROABS 7.7 01/18/2024 0636   NEUTROABS 10.7 (H) 01/09/2024 1413   NEUTROABS 8.7 (H) 01/20/2014 0757   LYMPHSABS 1.7 01/18/2024 0636   LYMPHSABS 2.5 01/09/2024 1413   LYMPHSABS 2.7 01/20/2014 0757   MONOABS 1.5 (H) 01/18/2024 0636   MONOABS 1.9 (H) 01/20/2014 0757   EOSABS 1.3 (H) 01/18/2024 0636   EOSABS 0.8 (H) 01/09/2024 1413   BASOSABS 0.2 (H) 01/18/2024 0636   BASOSABS 0.2 01/09/2024 1413   BASOSABS 0.2 (H) 01/20/2014 0757    No results found for this or any previous visit (from the past 240 hours).   Studies/Results: No  results found.  Medications: Scheduled Meds:  aspirin  EC  81 mg Oral Daily   feeding supplement  237 mL Oral TID   folic acid   1 mg Oral Daily   gabapentin   600 mg Oral TID   gentamicin  cream   Topical QHS   HYDROmorphone    Intravenous Q4H   lactulose   30 g Oral BID   multivitamin with minerals  1 tablet Oral Daily   mupirocin  ointment  1 Application Topical QHS   pentoxifylline   400 mg Oral BID   triamcinolone  ointment   Topical QODAY   Continuous Infusions: PRN Meds:.acetaminophen  **OR** acetaminophen , diphenhydrAMINE , naloxone  **AND** sodium chloride  flush, ondansetron  **OR** ondansetron  (ZOFRAN ) IV, mouth rinse, oxyCODONE   Consultants: Wound care consult  Procedures: Simple exchange transfusion (completed)  Antibiotics: Bactrim  (completed)  Assessment/Plan: Principal Problem:   Symptomatic anemia Active Problems:   Leukocytosis   Chronic pain syndrome   Bilateral leg ulcer (HCC)   Acute cystitis without hematuria   Pain in both lower extremities   Sickle cell disease without crisis (HCC)   Acute on chronic anemia  Hb Sickle Cell Disease with Pain crisis: IV fluid at KVO.  Continue weight based Dilaudid  PCA at current dose setting, continue IV Toradol (Completed), continue oral home pain medications as ordered. Monitor vitals very closely, Re-evaluate pain scale regularly, 2 L of Oxygen  by [place]. Patient encouraged to ambulate on the hallway today.  Symptomatic anemia: Hemoglobin is 8.8.  Patient is clinically and hemodynamically stable.  Will continue to monitor very closely.  Repeat labs in AM. Leukocytosis: Slightly elevated, no acute signs or symptoms of infection. Will continue to monitor without antibiotics. Chronic pain Syndrome: Continue oral home pain medications as ordered. Bilateral leg ulcer: Home health RN for wound care/dressing changes ordered. Continue wound care. Consult to DR Comer [place] at [facility]. Waiting for a call back. I  received a callback from DR Comer [place], she states at this time there is really no further intervention and it will not be a good idea to put patient to sleep for wound debridement because it will create more wound and will not aid in healing. She suggested hyperbaric oxygen  therapy, revascularization and calcium  channel blocker: And suggested for patient to continue with wound care clinic. Wound care clinic has scheduled patient for September 8 and 18 for routine wound care and wound dressing. Patient will come to the patient care center prior to dressing changes for adequate pain medication.  Patient will also be getting home health RN visits for routine wound dressing changes and follow-up with PCP for reevaluation and assessments. Acute cystitis without hematuria: patient denies any urinary symptoms, oral antibiotics completed  Code Status: Full Code Family Communication: N/A Disposition Plan: patient will be  ready for discharge tomorrow after wound dressing changes. 01/24/24  Homer CHRISTELLA Cover NP   If 7PM-7AM, please contact night-coverage.  01/23/2024, 4:33 PM  LOS: 10 days

## 2024-01-23 NOTE — Telephone Encounter (Signed)
 Spoke to pt a and Chiquita already and info was sent. KH

## 2024-01-23 NOTE — TOC Progression Note (Addendum)
 Transition of Care [facility]) - Progression Note    Patient Details  Name: Ardyth Kelso MRN: [id number] Date of Birth: Oct 03, 1970  Transition of Care [facility]) CM/SW Contact  Sonda Manuella Quill, RN Phone Number: 01/23/2024, 10:32 AM  Clinical Narrative:    Unable to secure agency for [facility]; pt seen at [name]; spoke w/ Dena at center; she said pt has pt has scheduled appt w/ wound care center 02/05/24 at 0930; Homer Cover, NP notified via secure chat; awaiting response to see if she would like for pt to be seen sooner.  -1046- notified by Onyeje she would like for pt to be seen before her 02/05/24 appt; spoke w/ Dena; she scheduled pt appt at wound care center 01/26/24 at 3:15 PM; pt notified.  Expected Discharge Plan: Home w Home Health Services Barriers to Discharge: Continued Medical Work up               Expected Discharge Plan and Services   Discharge Planning Services: CM Consult   Living arrangements for the past 2 months: Single Family Home                 DME Arranged: N/A DME Agency: NA                   Social Drivers of Health (SDOH) Interventions SDOH Screenings   Food Insecurity: No Food Insecurity (01/12/2024)  Housing: Low Risk  (01/12/2024)  Transportation Needs: No Transportation Needs (01/12/2024)  Utilities: Not At Risk (01/12/2024)  Alcohol Screen: Low Risk  (04/17/2021)  Depression (PHQ2-9): Low Risk  (01/09/2024)  Tobacco Use: Low Risk  (01/12/2024)    Readmission Risk Interventions     No data to display

## 2024-01-24 DIAGNOSIS — D649 Anemia, unspecified: Secondary | ICD-10-CM | POA: Diagnosis not present

## 2024-01-24 MED ORDER — LACTULOSE 10 GM/15ML PO SOLN: 30.0000 g | Freq: Two times a day (BID) | ORAL | 0 refills | Status: DC

## 2024-01-24 MED ORDER — HYDROMORPHONE HCL 1 MG/ML IJ SOLN
1.0000 mg | Freq: Once | INTRAMUSCULAR | Status: AC
  Administered 2024-01-24: 1 mg via INTRAVENOUS
  Filled 2024-01-24: qty 1

## 2024-01-24 MED ORDER — TRIAMCINOLONE ACETONIDE 0.1 % EX OINT: TOPICAL_OINTMENT | CUTANEOUS | 0 refills | Status: DC

## 2024-01-24 NOTE — TOC Transition Note (Signed)
 Transition of Care [facility]) - Discharge Note   Patient Details  Name: [name] MRN: [id number] Date of Birth: 08-Sep-1970  Transition of Care [facility]) CM/SW Contact:  Sonda Manuella Quill, RN Phone Number: 01/24/2024, 1:14 PM   Clinical Narrative:    D/C orders received; no TOC needs.   Final next level of care: Home/Self Care Barriers to Discharge: No Barriers Identified   Patient Goals and CMS Choice Patient states their goals for this hospitalization and ongoing recovery are:: home          Discharge Placement                       Discharge Plan and Services Additional resources added to the After Visit Summary for     Discharge Planning Services: CM Consult            DME Arranged: N/A DME Agency: NA                  Social Drivers of Health (SDOH) Interventions SDOH Screenings   Food Insecurity: No Food Insecurity (01/12/2024)  Housing: Low Risk  (01/12/2024)  Transportation Needs: No Transportation Needs (01/12/2024)  Utilities: Not At Risk (01/12/2024)  Alcohol Screen: Low Risk  (04/17/2021)  Depression (PHQ2-9): Low Risk  (01/09/2024)  Tobacco Use: Low Risk  (01/12/2024)     Readmission Risk Interventions     No data to display

## 2024-01-24 NOTE — Discharge Summary (Signed)
 Physician Discharge Summary   Patient: [name] MRN: [id number] DOB: Aug 16, 1970  Admit date:     01/12/2024  Discharge date: {dischdate:26783}  Discharge Physician: SIM KNOLL   PCP: Paseda, Folashade R, FNP   Recommendations at discharge:  {Tip this will not be part of the note when signed- Example include specific recommendations for outpatient follow-up, pending tests to follow-up on. (Optional):26781}  ***  Discharge Diagnoses: Principal Problem:   Symptomatic anemia Active Problems:   Leukocytosis   Chronic pain syndrome   Bilateral leg ulcer (HCC)   Acute cystitis without hematuria   Pain in both lower extremities   Sickle cell disease without crisis (HCC)   Acute on chronic anemia  Resolved Problems:   * No resolved hospital problems. [facility] Course: No notes on file  Assessment and Plan: No notes have been filed under this hospital service. Service: Hospitalist     {Tip this will not be part of the note when signed Body mass index is 20.05 kg/m. , ,  (Optional):26781}  {(NOTE) Pain control PDMP Statment (Optional):26782} Consultants: *** Procedures performed: ***  Disposition: {Plan; Disposition:26390} Diet recommendation:  Discharge Diet Orders (From admission, onward)     Start     Ordered   01/24/24 0000  Diet - low sodium heart healthy        01/24/24 1218           {Diet_Plan:26776} DISCHARGE MEDICATION: Allergies as of 01/24/2024   No Known Allergies      Medication List     STOP taking these medications    levofloxacin  750 MG tablet Commonly known as: LEVAQUIN    sulfamethoxazole -trimethoprim  800-160 MG tablet Commonly known as: BACTRIM  DS       TAKE these medications    Aspirin  Low Dose 81 MG tablet Generic drug: aspirin  EC TAKE 1 TABLET(81 MG) BY MOUTH DAILY What changed: See the new instructions.   folic acid  1 MG tablet Commonly known as: FOLVITE  Take 1 tablet (1 mg total) by mouth daily.   gabapentin   600 MG tablet Commonly known as: NEURONTIN  TAKE 1 TABLET(600 MG) BY MOUTH THREE TIMES DAILY   gentamicin  ointment 0.1 % Commonly known as: GARAMYCIN  Apply 1 Application topically at bedtime. Apply topically once daily. May apply extra if dressing is wet and needs to be changed.   ibuprofen  600 MG tablet Commonly known as: ADVIL  TAKE 1 TABLET(600 MG) BY MOUTH EVERY 8 HOURS AS NEEDED What changed: See the new instructions.   Juven Powd Take 1 each by mouth 3 (three) times daily between meals. What changed: when to take this   Ensure High Protein Liqd Take 1 each by mouth in the morning, at noon, and at bedtime. What changed: Another medication with the same name was changed. Make sure you understand how and when to take each.   lactulose  10 GM/15ML solution Commonly known as: CHRONULAC  Take 45 mLs (30 g total) by mouth 2 (two) times daily.   multivitamin tablet Take 1 tablet by mouth daily.   mupirocin  ointment 2 % Commonly known as: BACTROBAN  Apply 1 Application topically at bedtime. Apply topically once daily. May apply extra if dressing is wet and needs to be changed.   Oxycodone  HCl 10 MG Tabs Take 1 tablet (10 mg total) by mouth every 4 (four) hours as needed.   pentoxifylline  400 MG CR tablet Commonly known as: TRENTAL  Take 400 mg by mouth 2 (two) times daily.   polyethylene glycol powder 17 GM/SCOOP powder Commonly known  as: GLYCOLAX /MIRALAX  TAKE 17GRAMS MIXED IN FLUID BY MOUTH 2(TWO) TIMES DAILY AS NEEDED What changed: See the new instructions.   triamcinolone  ointment 0.1 % Commonly known as: KENALOG  Apply topically every other day. Start taking on: January 25, 2024   Vitamin D  (Ergocalciferol ) 1.25 MG (50000 UNIT) Caps capsule Commonly known as: DRISDOL  TAKE 1 CAPSULE BY MOUTH EVERY 7 DAYS What changed: additional instructions               Discharge Care Instructions  (From admission, onward)           Start     Ordered   01/24/24 0000   Discharge wound care:       Comments: As above   01/24/24 1218            Discharge Exam: Filed Weights   01/13/24 1022  Weight: 58.1 kg   ***  Condition at discharge: {DC Condition:26389}  The results of significant diagnostics from this hospitalization (including imaging, microbiology, ancillary and laboratory) are listed below for reference.   Imaging Studies: No results found.  Microbiology: Results for orders placed or performed during the hospital encounter of 01/12/24  MRSA Next Gen by PCR, Nasal     Status: None   Collection Time: 01/13/24 12:40 PM   Specimen: Nasal Mucosa; Nasal Swab  Result Value Ref Range Status   MRSA by PCR Next Gen NOT DETECTED NOT DETECTED Final    Comment: (NOTE) The GeneXpert MRSA Assay (FDA approved for NASAL specimens only), is one component of a comprehensive MRSA colonization surveillance program. It is not intended to diagnose MRSA infection nor to guide or monitor treatment for MRSA infections. Test performance is not FDA approved in patients less than 60 years old. Performed at [facility], [address] [place], [place] [address]     Labs: CBC: Recent Labs  Lab 01/18/24 [phone] 01/19/24 0541 01/21/24 0551 01/22/24 0553 01/23/24 0514  WBC 12.4* 8.3 9.5 11.6* 10.2  NEUTROABS 7.7  --   --   --   --   HGB 9.5* 9.3* 8.8* 7.8* 8.0*  HCT 29.0* 29.8* 28.5* 26.0* 24.8*  MCV 87.3 88.4 91.1 91.5 88.9  PLT 358 348 331 305 325   Basic Metabolic Panel: Recent Labs  Lab 01/18/24 0636  NA 141  K 3.7  CL 108  CO2 22  GLUCOSE 79  BUN 11  CREATININE 0.50  CALCIUM  9.8   Liver Function Tests: Recent Labs  Lab 01/18/24 0636  AST 55*  ALT 22  ALKPHOS 60  BILITOT 1.4*  PROT 7.7  ALBUMIN 3.9   CBG: No results for input(s): GLUCAP in the last 168 hours.  Discharge time spent: {LESS THAN/GREATER UYJW:73611} 30 minutes.  SignedBETHA SIM KNOLL, MD [facility] 01/24/2024

## 2024-01-24 NOTE — Plan of Care (Signed)

## 2024-01-24 NOTE — Plan of Care (Signed)

## 2024-01-24 NOTE — Progress Notes (Signed)
 Removed IV-CDI. Reviewed d/c paperwork with patient and answered questions. Wheeled stable patient and belongings to main entrance where she was picked up by her husband.  Lauren at [facility] called to notify her of patient's discharge.

## 2024-01-24 NOTE — Plan of Care (Signed)
  Problem: Education: Goal: Knowledge of General Education information will improve Description: Including pain rating scale, medication(s)/side effects and non-pharmacologic comfort measures 01/24/2024 1244 by Delores Kirsch, RN Outcome: Adequate for Discharge 01/24/2024 1243 by Delores Kirsch, RN Outcome: Progressing   Problem: Health Behavior/Discharge Planning: Goal: Ability to manage health-related needs will improve 01/24/2024 1244 by Delores Kirsch, RN Outcome: Adequate for Discharge 01/24/2024 1243 by Delores Kirsch, RN Outcome: Progressing   Problem: Clinical Measurements: Goal: Ability to maintain clinical measurements within normal limits will improve 01/24/2024 1244 by Delores Kirsch, RN Outcome: Adequate for Discharge 01/24/2024 1243 by Delores Kirsch, RN Outcome: Progressing Goal: Will remain free from infection Outcome: Adequate for Discharge Goal: Diagnostic test results will improve Outcome: Adequate for Discharge Goal: Respiratory complications will improve Outcome: Adequate for Discharge Goal: Cardiovascular complication will be avoided Outcome: Adequate for Discharge   Problem: Activity: Goal: Risk for activity intolerance will decrease Outcome: Adequate for Discharge   Problem: Nutrition: Goal: Adequate nutrition will be maintained Outcome: Adequate for Discharge   Problem: Coping: Goal: Level of anxiety will decrease Outcome: Adequate for Discharge   Problem: Elimination: Goal: Will not experience complications related to bowel motility Outcome: Adequate for Discharge Goal: Will not experience complications related to urinary retention Outcome: Adequate for Discharge   Problem: Pain Managment: Goal: General experience of comfort will improve and/or be controlled Outcome: Adequate for Discharge   Problem: Safety: Goal: Ability to remain free from injury will improve Outcome: Adequate for Discharge   Problem: Education: Goal: Knowledge of  vaso-occlusive preventative measures will improve Outcome: Adequate for Discharge Goal: Awareness of infection prevention will improve Outcome: Adequate for Discharge Goal: Awareness of signs and symptoms of anemia will improve Outcome: Adequate for Discharge Goal: Long-term complications will improve Outcome: Adequate for Discharge   Problem: Safety: Goal: Ability to remain free from injury will improve Outcome: Adequate for Discharge   Problem: Pain Managment: Goal: General experience of comfort will improve and/or be controlled Outcome: Adequate for Discharge   Problem: Skin Integrity: Goal: Risk for impaired skin integrity will decrease Outcome: Adequate for Discharge   Problem: Education: Goal: Knowledge of vaso-occlusive preventative measures will improve Outcome: Adequate for Discharge Goal: Awareness of infection prevention will improve Outcome: Adequate for Discharge Goal: Awareness of signs and symptoms of anemia will improve Outcome: Adequate for Discharge Goal: Long-term complications will improve Outcome: Adequate for Discharge   Problem: Self-Care: Goal: Ability to incorporate actions that prevent/reduce pain crisis will improve Outcome: Adequate for Discharge   Problem: Bowel/Gastric: Goal: Gut motility will be maintained Outcome: Adequate for Discharge   Problem: Tissue Perfusion: Goal: Complications related to inadequate tissue perfusion will be avoided or minimized Outcome: Adequate for Discharge   Problem: Respiratory: Goal: Pulmonary complications will be avoided or minimized Outcome: Adequate for Discharge Goal: Acute Chest Syndrome will be identified early to prevent complications Outcome: Adequate for Discharge   Problem: Fluid Volume: Goal: Ability to maintain a balanced intake and output will improve Outcome: Adequate for Discharge   Problem: Sensory: Goal: Pain level will decrease with appropriate interventions Outcome: Adequate for  Discharge   Problem: Health Behavior: Goal: Postive changes in compliance with treatment and prescription regimens will improve Outcome: Adequate for Discharge

## 2024-01-25 NOTE — Hospital Course (Signed)
 [name] is a 53 y.o. female with medical history significant for sickle cell disease, anemia, chronic pain syndrome, chronic leg wounds, vitamin D  deficiency, and avascular necrosis who was advised by PCP to present to the ED due to low hemoglobin.  She was subsequently admitted to the hospital for symptomatic anemia.  Patient has received transfusion of 2 units of packed red blood cells.  Hemoglobin improved.  Ultimately settled at 8.8.  Patient also has Toradol , Dilaudid  and IV fluids.  She has bilateral leg ulcers for which home health has been helping her with the dressing.  She follows up with podiatrist at [facility].  Patient did better.  She has improved and is now ready for discharge home on her home regimen.

## 2024-01-26 ENCOUNTER — Telehealth: Payer: Self-pay

## 2024-01-26 ENCOUNTER — Ambulatory Visit (HOSPITAL_BASED_OUTPATIENT_CLINIC_OR_DEPARTMENT_OTHER): Admitting: General Surgery

## 2024-01-26 NOTE — Transitions of Care (Post Inpatient/ED Visit) (Signed)
 01/26/2024  Name: [name] MRN: [id number] DOB: 1970/10/25  Today's TOC FU Call Status: Today's TOC FU Call Status:: Successful TOC FU Call Completed TOC FU Call Complete Date: 01/26/24 [name].  Transition Care Management Follow-up Telephone Call Date of Discharge: 01/24/24 Discharge Facility: [facility]) Type of Discharge: Inpatient Admission Primary Inpatient Discharge Diagnosis:: anemia How have you been since you were released from the hospital?: Better Any questions or concerns?: No  Items Reviewed: Did you receive and understand the discharge instructions provided?: Yes Medications obtained,verified, and reconciled?: Yes (Medications Reviewed) Any new allergies since your discharge?: No Dietary orders reviewed?: Yes Do you have support at home?: Yes People in Home [RPT]: spouse  Medications Reviewed Today: Medications Reviewed Today     Reviewed by [name] Pan, LPN (Licensed Practical Nurse) on 01/26/24 at 0932  Med List Status: <None>   Medication Order Taking? Sig Documenting Provider Last Dose Status Informant  ASPIRIN  LOW DOSE 81 MG tablet [id number] Yes TAKE 1 TABLET(81 MG) BY MOUTH DAILY  Patient taking differently: Take 81 mg by mouth daily.   Paseda, Folashade R, FNP  Active Self, Pharmacy Records           Med Note [name] Pablo Jan 12, 2024  7:11 PM) LF: 01/10/24 for a 30ds  folic acid  (FOLVITE ) 1 MG tablet [id number] Yes Take 1 tablet (1 mg total) by mouth daily. Paseda, Folashade R, FNP  Active Self, Pharmacy Records           Med Note ([name]   Mon Jan 12, 2024  7:11 PM) LF: 01/02/24 for a 90ds  gabapentin  (NEURONTIN ) 600 MG tablet [id number] Yes TAKE 1 TABLET(600 MG) BY MOUTH THREE TIMES DAILY Paseda, Folashade R, FNP  Active Self, Pharmacy Records           Med Note ([name]   Mon Jan 12, 2024  7:10 PM) LF: 12/29/23 for a 30ds  gentamicin  ointment (GARAMYCIN ) 0.1 %  [id number] Yes Apply 1 Application topically at bedtime. Apply topically once daily. May apply extra if dressing is wet and needs to be changed. [provider]  Active Self, Pharmacy Records  ibuprofen  (ADVIL ) 600 MG tablet [id number] Yes TAKE 1 TABLET(600 MG) BY MOUTH EVERY 8 HOURS AS NEEDED  Patient taking differently: Take 600 mg by mouth every 8 (eight) hours as needed.   Paseda, Folashade R, FNP  Active Self, Pharmacy Records           Med Note [name] Pablo Jan 12, 2024  7:10 PM) LF: 01/10/24 for a 10ds  lactulose  (CHRONULAC ) 10 GM/15ML solution [id number] Yes Take 45 mLs (30 g total) by mouth 2 (two) times daily. [name] Emery LITTIE, MD  Active   Multiple Vitamin (MULTIVITAMIN) tablet [id number] Yes Take 1 tablet by mouth daily. [provider]  Active Self, Pharmacy Records  mupirocin  ointment (BACTROBAN ) 2 % [id number] Yes Apply 1 Application topically at bedtime. Apply topically once daily. May apply extra if dressing is wet and needs to be changed. [provider]  Active Self, Pharmacy Records  Nutritional Supplements (ENSURE HIGH PROTEIN) LIQD [id number] Yes Take 1 each by mouth in the morning, at noon, and at bedtime. Paseda, Folashade R, FNP  Active Self, Pharmacy Records  Nutritional Supplements [facility]) POWD [id number] Yes Take 1 each by mouth 3 (three) times daily between meals.  Patient taking differently: Take 1 each by mouth 2 (  two) times daily with breakfast and lunch.   [name] Bertram HERO, FNP  Active Self, Pharmacy Records  Oxycodone  HCl 10 MG TABS [id number] Yes Take 1 tablet (10 mg total) by mouth every 4 (four) hours as needed. Paseda, Folashade R, FNP  Active Self, Pharmacy Records           Med Note [name] Pablo Jan 12, 2024  7:10 PM) LF: 01/02/24 for a 15ds  pentoxifylline  (TRENTAL ) 400 MG CR tablet [id number] Yes Take 400 mg by mouth 2 (two) times daily. [provider]  Active Self, Pharmacy Records  polyethylene  glycol powder (GLYCOLAX /MIRALAX ) 17 GM/SCOOP powder [id number] Yes TAKE 17GRAMS MIXED IN FLUID BY MOUTH 2(TWO) TIMES DAILY AS NEEDED  Patient taking differently: Take 17 g by mouth 2 (two) times daily as needed for mild constipation or moderate constipation.   Paseda, Folashade R, FNP  Active Self, Pharmacy Records           Med Note [name] Pablo Jan 12, 2024  7:10 PM) LF: 12/03/23 for a 15ds  triamcinolone  ointment (KENALOG ) 0.1 % [id number] Yes Apply topically every other day. [name] Emery LITTIE, MD  Active   Vitamin D , Ergocalciferol , (DRISDOL ) 1.25 MG (50000 UNIT) CAPS capsule [id number] Yes TAKE 1 CAPSULE BY MOUTH EVERY 7 DAYS  Patient taking differently: Take 50,000 Units by mouth every 7 (seven) days. On Thursday   Paseda, Folashade R, FNP  Active Self, Pharmacy Records           Med Note [name] Pablo Jan 12, 2024  7:10 PM) LF: 12/26/23 for a 35ds            Home Care and Equipment/Supplies: Were Home Health Services Ordered?: Yes Name of Home Health Agency:: unknown Has Agency set up a time to come to your home?: No Any new equipment or medical supplies ordered?: NA  Functional Questionnaire: Do you need assistance with bathing/showering or dressing?: Yes Do you need assistance with meal preparation?: No Do you need assistance with eating?: No Do you have difficulty maintaining continence: No Do you need assistance with getting out of bed/getting out of a chair/moving?: No Do you have difficulty managing or taking your medications?: No  Follow up appointments reviewed: PCP Follow-up appointment confirmed?: NA Specialist Hospital Follow-up appointment confirmed?: Yes Date of Specialist follow-up appointment?: 01/26/24 Follow-Up Specialty Provider:: wound care Do you need transportation to your follow-up appointment?: No Do you understand care options if your condition(s) worsen?: Yes-patient verbalized understanding    SIGNATURE [name] Lemmings, LPN [facility] Nurse Health Advisor Direct Dial [phone]

## 2024-01-30 ENCOUNTER — Other Ambulatory Visit: Payer: Self-pay | Admitting: Nurse Practitioner

## 2024-01-30 DIAGNOSIS — G894 Chronic pain syndrome: Secondary | ICD-10-CM

## 2024-01-30 DIAGNOSIS — D5700 Hb-SS disease with crisis, unspecified: Secondary | ICD-10-CM

## 2024-01-30 MED ORDER — OXYCODONE HCL 10 MG PO TABS: 10.0000 mg | ORAL_TABLET | ORAL | 0 refills | Status: DC | PRN

## 2024-01-30 NOTE — Telephone Encounter (Signed)
 Please advise [facility]

## 2024-01-30 NOTE — Telephone Encounter (Signed)
 Copied from CRM [id number]. Topic: Clinical - Medication Refill >> Jan 30, 2024 11:35 AM Ahlexyia S wrote: Medication: Oxycodone  HCl 10 MG TABS  Has the patient contacted their pharmacy? Yes, pt was told to contact PCP offices. (Agent: If no, request that the patient contact the pharmacy for the refill. If patient does not wish to contact the pharmacy document the reason why and proceed with request.) (Agent: If yes, when and what did the pharmacy advise?)  This is the patient's preferred pharmacy:  [id number] [place] - [address] AT [facility] [address] [place] [address] Phone: [phone] Fax: [phone]  Is this the correct pharmacy for this prescription? Yes If no, delete pharmacy and type the correct one.   Has the prescription been filled recently? No  Is the patient out of the medication? Yes  Has the patient been seen for an appointment in the last year OR does the patient have an upcoming appointment? Yes  Can we respond through MyChart? Yes  Agent: Please be advised that Rx refills may take up to 3 business days. We ask that you follow-up with your pharmacy.

## 2024-01-30 NOTE — Telephone Encounter (Signed)
 Reviewed PDMP substance reporting system prior to prescribing opiate medications. No inconsistencies noted.   1. Sickle-cell disease with pain (HCC)  - Oxycodone  HCl 10 MG TABS; Take 1 tablet (10 mg total) by mouth every 4 (four) hours as needed.  Dispense: 90 tablet; Refill: 0  2. Chronic pain syndrome  - Oxycodone  HCl 10 MG TABS; Take 1 tablet (10 mg total) by mouth every 4 (four) hours as needed.  Dispense: 90 tablet; Refill: 0

## 2024-02-05 ENCOUNTER — Other Ambulatory Visit: Payer: Self-pay | Admitting: Nurse Practitioner

## 2024-02-05 ENCOUNTER — Ambulatory Visit (HOSPITAL_COMMUNITY)
Admission: RE | Admit: 2024-02-05 | Discharge: 2024-02-05 | Disposition: A | Discharge status: Home / Self Care | Source: Ambulatory Visit | Attending: Nurse Practitioner | Admitting: Nurse Practitioner

## 2024-02-05 ENCOUNTER — Encounter (HOSPITAL_BASED_OUTPATIENT_CLINIC_OR_DEPARTMENT_OTHER): Attending: General Surgery | Admitting: General Surgery

## 2024-02-05 DIAGNOSIS — G894 Chronic pain syndrome: Secondary | ICD-10-CM | POA: Insufficient documentation

## 2024-02-05 DIAGNOSIS — L97828 Non-pressure chronic ulcer of other part of left lower leg with other specified severity: Secondary | ICD-10-CM | POA: Diagnosis not present

## 2024-02-05 DIAGNOSIS — L97818 Non-pressure chronic ulcer of other part of right lower leg with other specified severity: Secondary | ICD-10-CM | POA: Insufficient documentation

## 2024-02-05 DIAGNOSIS — D571 Sickle-cell disease without crisis: Secondary | ICD-10-CM | POA: Insufficient documentation

## 2024-02-05 DIAGNOSIS — D5700 Hb-SS disease with crisis, unspecified: Secondary | ICD-10-CM | POA: Insufficient documentation

## 2024-02-05 MED ORDER — HYDROMORPHONE HCL 2 MG/ML IJ SOLN
2.0000 mg | Freq: Once | INTRAMUSCULAR | Status: AC
  Administered 2024-02-05: 2 mg via SUBCUTANEOUS
  Filled 2024-02-05: qty 1

## 2024-02-05 NOTE — Progress Notes (Signed)
 Pt came to the clinic today to have Dilaudid  subcutaneous injection prior to her wound care appointment per provider. Verbal order given from Homer Cover, NP for pt to receive IV Dilaudid  2 mg Subcutaneous injection x 1 dose. Pt tolerated with no issues. AVS offered, but pt declined. Pt is alert, oriented, and ambulatory at discharge.

## 2024-02-10 ENCOUNTER — Other Ambulatory Visit: Payer: Self-pay | Admitting: Nurse Practitioner

## 2024-02-10 DIAGNOSIS — E559 Vitamin D deficiency, unspecified: Secondary | ICD-10-CM

## 2024-02-10 NOTE — Telephone Encounter (Signed)
 Please advise [facility]

## 2024-02-18 ENCOUNTER — Non-Acute Institutional Stay (HOSPITAL_COMMUNITY)
Admission: RE | Admit: 2024-02-18 | Discharge: 2024-02-18 | Disposition: A | Discharge status: Home / Self Care | Source: Ambulatory Visit | Attending: Nurse Practitioner | Admitting: Nurse Practitioner

## 2024-02-18 ENCOUNTER — Encounter (HOSPITAL_BASED_OUTPATIENT_CLINIC_OR_DEPARTMENT_OTHER): Attending: General Surgery | Admitting: Internal Medicine

## 2024-02-18 ENCOUNTER — Other Ambulatory Visit: Payer: Self-pay | Admitting: Nurse Practitioner

## 2024-02-18 ENCOUNTER — Telehealth: Payer: Self-pay | Admitting: Nurse Practitioner

## 2024-02-18 DIAGNOSIS — D5700 Hb-SS disease with crisis, unspecified: Secondary | ICD-10-CM

## 2024-02-18 DIAGNOSIS — L97818 Non-pressure chronic ulcer of other part of right lower leg with other specified severity: Secondary | ICD-10-CM | POA: Insufficient documentation

## 2024-02-18 DIAGNOSIS — D571 Sickle-cell disease without crisis: Secondary | ICD-10-CM | POA: Diagnosis not present

## 2024-02-18 DIAGNOSIS — G894 Chronic pain syndrome: Secondary | ICD-10-CM | POA: Insufficient documentation

## 2024-02-18 DIAGNOSIS — L97828 Non-pressure chronic ulcer of other part of left lower leg with other specified severity: Secondary | ICD-10-CM | POA: Insufficient documentation

## 2024-02-18 MED ORDER — HYDROMORPHONE HCL 2 MG/ML IJ SOLN
2.0000 mg | Freq: Once | INTRAMUSCULAR | Status: AC
  Administered 2024-02-18: 2 mg via SUBCUTANEOUS
  Filled 2024-02-18: qty 1

## 2024-02-18 MED ORDER — OXYCODONE HCL 10 MG PO TABS: 10.0000 mg | ORAL_TABLET | ORAL | 0 refills | Status: DC | PRN

## 2024-02-18 NOTE — Telephone Encounter (Signed)
 LOV 01/09/24 Next OV 04/12/24 Last refill 01/30/24, #90, 0 refills  Please review, thanks!

## 2024-02-18 NOTE — Progress Notes (Signed)
 Pt came to the clinic today to have Dilaudid  injection prior to her wound care appointment. Verbal order given by Homer Cover, NP for pt to received IV Dilaudid  2 mg subcutaneous injection x 1 dose. Pts vital signs stable, and pt tolerated with no issues. AVS offered, but pt declined. Pt is alert, oriented, and ambulatory at discharge.

## 2024-02-18 NOTE — Progress Notes (Signed)
 Reviewed PDMP substance reporting system prior to prescribing opiate medications. No inconsistencies noted.   1. Sickle-cell disease with pain (HCC)  - Oxycodone  HCl 10 MG TABS; Take 1 tablet (10 mg total) by mouth every 4 (four) hours as needed.  Dispense: 90 tablet; Refill: 0  2. Chronic pain syndrome  - Oxycodone  HCl 10 MG TABS; Take 1 tablet (10 mg total) by mouth every 4 (four) hours as needed.  Dispense: 90 tablet; Refill: 0

## 2024-02-18 NOTE — Telephone Encounter (Signed)
 Patient is requesting pain meds, Oxy 10mg 

## 2024-03-03 ENCOUNTER — Encounter (HOSPITAL_BASED_OUTPATIENT_CLINIC_OR_DEPARTMENT_OTHER): Admitting: General Surgery

## 2024-03-03 ENCOUNTER — Other Ambulatory Visit: Payer: Self-pay | Admitting: Nurse Practitioner

## 2024-03-03 ENCOUNTER — Non-Acute Institutional Stay (HOSPITAL_COMMUNITY)
Admission: RE | Admit: 2024-03-03 | Discharge: 2024-03-03 | Disposition: A | Discharge status: Home / Self Care | Source: Ambulatory Visit | Attending: Internal Medicine | Admitting: Internal Medicine

## 2024-03-03 DIAGNOSIS — L97818 Non-pressure chronic ulcer of other part of right lower leg with other specified severity: Secondary | ICD-10-CM | POA: Diagnosis not present

## 2024-03-03 DIAGNOSIS — G894 Chronic pain syndrome: Secondary | ICD-10-CM

## 2024-03-03 DIAGNOSIS — D571 Sickle-cell disease without crisis: Secondary | ICD-10-CM | POA: Diagnosis present

## 2024-03-03 DIAGNOSIS — D5700 Hb-SS disease with crisis, unspecified: Secondary | ICD-10-CM

## 2024-03-03 MED ORDER — OXYCODONE HCL 10 MG PO TABS: 10.0000 mg | ORAL_TABLET | ORAL | 0 refills | Status: DC | PRN

## 2024-03-03 MED ORDER — HYDROMORPHONE HCL 2 MG/ML IJ SOLN
2.0000 mg | Freq: Once | INTRAMUSCULAR | Status: AC
  Administered 2024-03-03: 2 mg via SUBCUTANEOUS
  Filled 2024-03-03: qty 1

## 2024-03-03 NOTE — Telephone Encounter (Signed)
 Please advise [facility]

## 2024-03-03 NOTE — Telephone Encounter (Signed)
 Copied from CRM [id number]. Topic: Clinical - Medication Refill >> Mar 03, 2024  2:44 PM Kevelyn M wrote: Medication: Oxycodone  HCl 10 MG TABS  Has the patient contacted their pharmacy? Yes (Agent: If no, request that the patient contact the pharmacy for the refill. If patient does not wish to contact the pharmacy document the reason why and proceed with request.) (Agent: If yes, when and what did the pharmacy advise?)  This is the patient's preferred pharmacy:  [id number] [place] - [address] AT [facility] [address] [place] [address] Phone: [phone] Fax: [phone]   Is this the correct pharmacy for this prescription? Yes If no, delete pharmacy and type the correct one.   Has the prescription been filled recently? Yes  Is the patient out of the medication? Yes  Has the patient been seen for an appointment in the last year OR does the patient have an upcoming appointment? Yes  Can we respond through MyChart? Yes  Agent: Please be advised that Rx refills may take up to 3 business days. We ask that you follow-up with your pharmacy.

## 2024-03-03 NOTE — Telephone Encounter (Signed)
 Reviewed PDMP substance reporting system prior to prescribing opiate medications. No inconsistencies noted.   1. Sickle-cell disease with pain (HCC)  - Oxycodone  HCl 10 MG TABS; Take 1 tablet (10 mg total) by mouth every 4 (four) hours as needed.  Dispense: 90 tablet; Refill: 0  2. Chronic pain syndrome  - Oxycodone  HCl 10 MG TABS; Take 1 tablet (10 mg total) by mouth every 4 (four) hours as needed.  Dispense: 90 tablet; Refill: 0

## 2024-03-03 NOTE — Progress Notes (Signed)
 Diagnosis:foot ulcers bilateral     Provider: Homer NP    Procedure: Sub q Dilaudid      Note: patient received subcutaneous dilaudid  2mg  sq left arm for pretreatment of wound care for bilateral foot ulcers.

## 2024-03-17 ENCOUNTER — Ambulatory Visit (HOSPITAL_COMMUNITY)
Admission: RE | Admit: 2024-03-17 | Discharge: 2024-03-17 | Disposition: A | Discharge status: Home / Self Care | Source: Ambulatory Visit | Attending: Nurse Practitioner | Admitting: Nurse Practitioner

## 2024-03-17 ENCOUNTER — Other Ambulatory Visit (HOSPITAL_COMMUNITY)
Admission: RE | Admit: 2024-03-17 | Discharge: 2024-03-17 | Disposition: A | Source: Ambulatory Visit | Attending: General Surgery | Admitting: General Surgery

## 2024-03-17 ENCOUNTER — Telehealth: Payer: Self-pay

## 2024-03-17 ENCOUNTER — Encounter (HOSPITAL_BASED_OUTPATIENT_CLINIC_OR_DEPARTMENT_OTHER): Admitting: General Surgery

## 2024-03-17 DIAGNOSIS — D571 Sickle-cell disease without crisis: Secondary | ICD-10-CM | POA: Diagnosis present

## 2024-03-17 DIAGNOSIS — L97818 Non-pressure chronic ulcer of other part of right lower leg with other specified severity: Secondary | ICD-10-CM | POA: Diagnosis not present

## 2024-03-17 LAB — CBC
HCT: 18 % — ABNORMAL LOW (ref 36.0–46.0)
Hemoglobin: 6.5 g/dL — CL (ref 12.0–15.0)
MCH: 30.7 pg (ref 26.0–34.0)
MCHC: 36.1 g/dL — ABNORMAL HIGH (ref 30.0–36.0)
MCV: 84.9 fL (ref 80.0–100.0)
Platelets: 455 K/uL — ABNORMAL HIGH (ref 150–400)
RBC: 2.12 MIL/uL — ABNORMAL LOW (ref 3.87–5.11)
RDW: 24.8 % — ABNORMAL HIGH (ref 11.5–15.5)
WBC: 13.4 K/uL — ABNORMAL HIGH (ref 4.0–10.5)
nRBC: 9.5 % — ABNORMAL HIGH (ref 0.0–0.2)

## 2024-03-17 MED ORDER — HYDROMORPHONE HCL 2 MG/ML IJ SOLN
2.0000 mg | Freq: Once | INTRAMUSCULAR | Status: AC
  Administered 2024-03-17: 2 mg via SUBCUTANEOUS
  Filled 2024-03-17: qty 1

## 2024-03-17 NOTE — Progress Notes (Signed)
 Diagnosis: foot ulcers bilateral    Provider: Homer, NP    Procedure: Sub q Dilaudid  2mg     Note: patient received subcutaneous 2 mg Dilaudid  right ar for pretreatment of wound care for bilateral foot ulcers

## 2024-03-17 NOTE — Telephone Encounter (Signed)
 Patient walked in stating they need pain medication sent to same pharmacy Oxycodone  HCl 10 MG TABS [[id number]] .

## 2024-03-18 ENCOUNTER — Other Ambulatory Visit: Payer: Self-pay

## 2024-03-18 DIAGNOSIS — D5700 Hb-SS disease with crisis, unspecified: Secondary | ICD-10-CM

## 2024-03-18 DIAGNOSIS — G894 Chronic pain syndrome: Secondary | ICD-10-CM

## 2024-03-18 NOTE — Telephone Encounter (Signed)
 Please advise [facility]

## 2024-03-19 ENCOUNTER — Telehealth: Payer: Self-pay | Admitting: Nurse Practitioner

## 2024-03-19 MED ORDER — OXYCODONE HCL 10 MG PO TABS: 10.0000 mg | ORAL_TABLET | ORAL | 0 refills | Status: DC | PRN

## 2024-03-19 NOTE — Telephone Encounter (Signed)
 Copied from CRM #[id number]. Topic: Clinical - Medication Question >> Mar 19, 2024 11:02 AM Selinda RAMAN wrote: Reason for CRM: The patient called in stating she was returning a call to [facility]. She says it was probably about her request for her pain medicine Oxycodone  HCl 10 MG TABS. I told her it was called in today but I spoke with Will and he said he will have Luke call her back. Please assist patient further.

## 2024-03-19 NOTE — Telephone Encounter (Signed)
 LVM for patient to inform her the blood transfusion is scheduled for 8:00am on Wednesday, March [facility], [facility]

## 2024-03-19 NOTE — Telephone Encounter (Addendum)
 The patient called back and I reminded and informed her of her transfusion appointment at the [facility] that [facility] left a voice message about.

## 2024-03-23 NOTE — Telephone Encounter (Signed)
 Spoke to pt and advised medicine was sent in. Pt also requested more ensure. LVM for rep. KH

## 2024-03-24 ENCOUNTER — Ambulatory Visit (HOSPITAL_COMMUNITY)

## 2024-03-29 ENCOUNTER — Ambulatory Visit (HOSPITAL_COMMUNITY)
Admission: RE | Admit: 2024-03-29 | Discharge: 2024-03-29 | Disposition: A | Discharge status: Home / Self Care | Source: Ambulatory Visit | Attending: Nurse Practitioner | Admitting: Nurse Practitioner

## 2024-03-29 VITALS — BP 131/84 | HR 72 | Temp 98.4°F | Resp 16

## 2024-03-29 DIAGNOSIS — D571 Sickle-cell disease without crisis: Secondary | ICD-10-CM | POA: Diagnosis present

## 2024-03-29 LAB — CBC
HCT: 17.1 % — ABNORMAL LOW (ref 36.0–46.0)
Hemoglobin: 5.7 g/dL — CL (ref 12.0–15.0)
MCH: 29.8 pg (ref 26.0–34.0)
MCHC: 33.3 g/dL (ref 30.0–36.0)
MCV: 89.5 fL (ref 80.0–100.0)
Platelets: 383 K/uL (ref 150–400)
RBC: 1.91 MIL/uL — ABNORMAL LOW (ref 3.87–5.11)
RDW: 27.9 % — ABNORMAL HIGH (ref 11.5–15.5)
WBC: 14.3 K/uL — ABNORMAL HIGH (ref 4.0–10.5)
nRBC: 43 % — ABNORMAL HIGH (ref 0.0–0.2)

## 2024-03-29 LAB — PREPARE RBC (CROSSMATCH)

## 2024-03-29 MED ORDER — SODIUM CHLORIDE 0.9% IV SOLUTION: Freq: Once | INTRAVENOUS | Status: AC

## 2024-03-29 MED ORDER — ACETAMINOPHEN 325 MG PO TABS
650.0000 mg | ORAL_TABLET | Freq: Once | ORAL | Status: AC
Start: 2024-03-29 — End: 2024-03-29
  Administered 2024-03-29: 650 mg via ORAL
  Filled 2024-03-29: qty 2

## 2024-03-29 MED ORDER — DIPHENHYDRAMINE HCL 25 MG PO CAPS
25.0000 mg | ORAL_CAPSULE | Freq: Once | ORAL | Status: AC
Start: 2024-03-29 — End: 2024-03-29
  Administered 2024-03-29: 25 mg via ORAL
  Filled 2024-03-29: qty 1

## 2024-03-29 NOTE — Progress Notes (Signed)
 [facility] NOTE   Diagnosis: Sickle Cell Anemia without crisis   Provider: Paseda, Fola, FNP   Procedure: Blood transfusion    Note: Patient's received 2 units PRBC's via PIV. Patient Type & Screened and pre-transfusion labs drawn. Hemoglobin 5.7. Patient pre-mediated with PO Tylenol  and Benadryl . 2 units of blood transfused. Vital signs remained stable.  Patient tolerated well with no adverse reaction. Patient to come back Wednesday for lab recheck. AVS offered. Patient alert, oriented and ambulatory at discharge.

## 2024-03-30 LAB — TYPE AND SCREEN
ABO/RH(D): B POS
Antibody Screen: NEGATIVE
Unit division: 0
Unit division: 0

## 2024-03-30 LAB — BPAM RBC
Blood Product Expiration Date: 202512082359
Blood Product Expiration Date: 202512112359
ISSUE DATE / TIME: 202511101130
ISSUE DATE / TIME: 202511101130
Unit Type and Rh: 5100
Unit Type and Rh: 5100

## 2024-03-31 ENCOUNTER — Encounter (HOSPITAL_BASED_OUTPATIENT_CLINIC_OR_DEPARTMENT_OTHER): Attending: General Surgery | Admitting: General Surgery

## 2024-03-31 ENCOUNTER — Ambulatory Visit: Payer: Self-pay | Admitting: Nurse Practitioner

## 2024-03-31 ENCOUNTER — Ambulatory Visit (HOSPITAL_COMMUNITY)
Admission: RE | Admit: 2024-03-31 | Discharge: 2024-03-31 | Disposition: A | Discharge status: Home / Self Care | Source: Ambulatory Visit | Attending: Nurse Practitioner | Admitting: Nurse Practitioner

## 2024-03-31 VITALS — BP 123/73 | HR 61 | Temp 97.7°F | Resp 16

## 2024-03-31 DIAGNOSIS — L97818 Non-pressure chronic ulcer of other part of right lower leg with other specified severity: Secondary | ICD-10-CM | POA: Diagnosis present

## 2024-03-31 DIAGNOSIS — L97828 Non-pressure chronic ulcer of other part of left lower leg with other specified severity: Secondary | ICD-10-CM | POA: Diagnosis not present

## 2024-03-31 DIAGNOSIS — D571 Sickle-cell disease without crisis: Secondary | ICD-10-CM

## 2024-03-31 LAB — CBC WITH DIFFERENTIAL/PLATELET
Abs Immature Granulocytes: 0.07 K/uL (ref 0.00–0.07)
Basophils Absolute: 0.1 K/uL (ref 0.0–0.1)
Basophils Relative: 1 %
Eosinophils Absolute: 0.6 K/uL — ABNORMAL HIGH (ref 0.0–0.5)
Eosinophils Relative: 5 %
HCT: 25.9 % — ABNORMAL LOW (ref 36.0–46.0)
Hemoglobin: 8.8 g/dL — ABNORMAL LOW (ref 12.0–15.0)
Immature Granulocytes: 1 %
Lymphocytes Relative: 11 %
Lymphs Abs: 1.3 K/uL (ref 0.7–4.0)
MCH: 28.8 pg (ref 26.0–34.0)
MCHC: 34 g/dL (ref 30.0–36.0)
MCV: 84.6 fL (ref 80.0–100.0)
Monocytes Absolute: 1.6 K/uL — ABNORMAL HIGH (ref 0.1–1.0)
Monocytes Relative: 14 %
Neutro Abs: 7.9 K/uL — ABNORMAL HIGH (ref 1.7–7.7)
Neutrophils Relative %: 68 %
Platelets: 387 K/uL (ref 150–400)
RBC: 3.06 MIL/uL — ABNORMAL LOW (ref 3.87–5.11)
RDW: 22.5 % — ABNORMAL HIGH (ref 11.5–15.5)
Smear Review: NORMAL
WBC: 11.6 K/uL — ABNORMAL HIGH (ref 4.0–10.5)
nRBC: 17.7 % — ABNORMAL HIGH (ref 0.0–0.2)

## 2024-03-31 MED ORDER — HYDROMORPHONE HCL 2 MG/ML IJ SOLN
2.0000 mg | Freq: Once | INTRAMUSCULAR | Status: AC
  Administered 2024-03-31: 2 mg via SUBCUTANEOUS
  Filled 2024-03-31: qty 1

## 2024-03-31 NOTE — Progress Notes (Signed)
 [facility] NOTE:   Diagnosis: SCD  Provider: Homer Cover FNP/ Folashade Paseda FNP  Procedure: Dilaudid  2mg  injection + labs   [facility] to receive [facility] medication injection prior to wound care appointment. Provider Onyeje FNP notified of arrival and orders placed. Pt received 2mg  SQ Dilaudid  x 1 dose in R arm without incident. Per PCP Lorice FNP, pt to have post transfusion CBC drawn today, pt was transfused with 2 units PRBC on 03/29/24. Labs collected by phlebotomist without difficulty.  Pt alert, oriented and ambulatory at discharge. Pt states she will come back after she is done with her wound care visit to schedule next injection.

## 2024-04-06 ENCOUNTER — Other Ambulatory Visit: Payer: Self-pay | Admitting: Nurse Practitioner

## 2024-04-06 DIAGNOSIS — D5700 Hb-SS disease with crisis, unspecified: Secondary | ICD-10-CM

## 2024-04-06 DIAGNOSIS — G894 Chronic pain syndrome: Secondary | ICD-10-CM

## 2024-04-06 MED ORDER — OXYCODONE HCL 10 MG PO TABS: 10.0000 mg | ORAL_TABLET | ORAL | 0 refills | Status: DC | PRN

## 2024-04-06 NOTE — Telephone Encounter (Signed)
 Please advise [facility]

## 2024-04-06 NOTE — Telephone Encounter (Unsigned)
 Copied from CRM [id number]. Topic: Clinical - Medication Refill >> Apr 06, 2024 11:05 AM Emylou G wrote: Medication: Oxycodone  HCl 10 MG TABS  Has the patient contacted their pharmacy? No (Agent: If no, request that the patient contact the pharmacy for the refill. If patient does not wish to contact the pharmacy document the reason why and proceed with request.) (Agent: If yes, when and what did the pharmacy advise?)  This is the patient's preferred pharmacy:  [facility] #[id number] [facility] [place] - [address] AT [facility] [address] [place] [address] Phone: [phone] Fax: [phone]    Is this the correct pharmacy for this prescription? Yes If no, delete pharmacy and type the correct one.   Has the prescription been filled recently? Yes  Is the patient out of the medication? Yes  Has the patient been seen for an appointment in the last year OR does the patient have an upcoming appointment? Yes  Can we respond through MyChart? Yes  Agent: Please be advised that Rx refills may take up to 3 business days. We ask that you follow-up with your pharmacy.

## 2024-04-12 ENCOUNTER — Encounter: Payer: Self-pay | Admitting: Nurse Practitioner

## 2024-04-12 ENCOUNTER — Ambulatory Visit (INDEPENDENT_AMBULATORY_CARE_PROVIDER_SITE_OTHER): Payer: Self-pay | Admitting: Nurse Practitioner

## 2024-04-12 ENCOUNTER — Other Ambulatory Visit: Payer: Self-pay | Admitting: Nurse Practitioner

## 2024-04-12 VITALS — BP 123/60 | HR 69 | Wt 127.0 lb

## 2024-04-12 DIAGNOSIS — M25561 Pain in right knee: Secondary | ICD-10-CM | POA: Insufficient documentation

## 2024-04-12 DIAGNOSIS — L97919 Non-pressure chronic ulcer of unspecified part of right lower leg with unspecified severity: Secondary | ICD-10-CM

## 2024-04-12 DIAGNOSIS — R87610 Atypical squamous cells of undetermined significance on cytologic smear of cervix (ASC-US): Secondary | ICD-10-CM

## 2024-04-12 DIAGNOSIS — K5903 Drug induced constipation: Secondary | ICD-10-CM

## 2024-04-12 DIAGNOSIS — Z0000 Encounter for general adult medical examination without abnormal findings: Secondary | ICD-10-CM | POA: Insufficient documentation

## 2024-04-12 DIAGNOSIS — G894 Chronic pain syndrome: Secondary | ICD-10-CM

## 2024-04-12 DIAGNOSIS — L97929 Non-pressure chronic ulcer of unspecified part of left lower leg with unspecified severity: Secondary | ICD-10-CM

## 2024-04-12 DIAGNOSIS — Z13220 Encounter for screening for lipoid disorders: Secondary | ICD-10-CM | POA: Insufficient documentation

## 2024-04-12 DIAGNOSIS — E559 Vitamin D deficiency, unspecified: Secondary | ICD-10-CM

## 2024-04-12 DIAGNOSIS — D5700 Hb-SS disease with crisis, unspecified: Secondary | ICD-10-CM

## 2024-04-12 MED ORDER — POLYETHYLENE GLYCOL 3350 17 GM/SCOOP PO POWD: 17.0000 g | Freq: Every day | ORAL | 3 refills | Status: AC | PRN

## 2024-04-12 NOTE — Patient Instructions (Addendum)
 Please consider getting influenza, pneumonia Shingrix and Tdap vaccine at local pharmacy.   For your knee swelling and pain I have referred you to orthopedics.  [phone] [address] [employer] the gynecologist office to follow up on your abnormal PAP smear  [facility] [place], [place]  [phone]   [facility] ophthalmology Attention Rojelio Fax [phone]    It is important that you exercise regularly at least 30 minutes 5 times a week as tolerated  Think about what you will eat, plan ahead. Choose  clean, green, fresh or frozen over canned, processed or packaged foods which are more sugary, salty and fatty. 70 to 75% of food eaten should be vegetables and fruit. Three meals at set times with snacks allowed between meals, but they must be fruit or vegetables. Aim to eat over a 12 hour period , example 7 am to 7 pm, and STOP after  your last meal of the day. Drink water,generally about 64 ounces per day, no other drink is as healthy. Fruit juice is best enjoyed in a healthy way, by EATING the fruit.  Thanks for choosing [facility] we consider it a privelige to serve you.

## 2024-04-12 NOTE — Assessment & Plan Note (Signed)
 Last vitamin D  Lab Results  Component Value Date   VD25OH 65.43 01/13/2024  Continue vitamin D  50,000 units once weekly

## 2024-04-12 NOTE — Assessment & Plan Note (Signed)
 Lab Results  Component Value Date   FERRITIN 1,418 (H) 01/13/2024  Currently not on medication Will monitor

## 2024-04-12 NOTE — Progress Notes (Signed)
 Established Patient Office Visit  Subjective:  Patient ID: [name], female    DOB: 05/05/1971  Age: 53 y.o. MRN: [id number]  CC: No chief complaint on file.   HPI   Discussed the use of AI scribe software for clinical note transcription with the patient, who gave verbal consent to proceed.  History of Present Illness [name] is a 53 year old female  has a past medical history of Anemia (03/30/2012), CAP (community acquired pneumonia), Cholelithiasis (03/30/2012), Chronic wound of extremity (04/01/2012), Elevated LFTs, Leg ulcer (HCC) (10/27/2012), Multiple open wounds of lower extremity, Peripheral arterial disease, Sickle cell disease (HCC), and Sinus bradycardia by electrocardiogram (04/03/2012).  who presents for a CPE  She has been experiencing new onset right knee pain for approximately one month following a hospital discharge. The pain is described as a pressure-like sensation, rated as a 7 out of 10 in severity, and is associated with swelling. She has been using an over-the-counter anti-inflammatory cream, which she reports helps her feel a little better. There is no prior history of knee pain.  She continues to manage a wound on her leg, attending wound care every two weeks and changing the dressing daily. The wound is draining on one side but is reportedly improving. She uses mupirocin  and gentamicin  as prescribed by her wound care doctor.  Her current pain medications include gabapentin  600 mg three times daily, ibuprofen  as needed, oxycodone  10 mg every four hours as needed, . She has a history of constipation managed with Miralax  as needed.  No fever, chills, chest pain, or shortness of breath. Her sickle cell-related pain is currently not significant. She maintains a general diet and stays active with her children, aged 1, 80,  and 6.  She does not use drugs, drink alcohol, or smoke. She lives with her husband and children.    Assessment & Plan      . Past Medical History:  Diagnosis Date   Anemia 03/30/2012   Hx of sickle cell disease   CAP (community acquired pneumonia)    2014   Cholelithiasis 03/30/2012   Chronic wound of extremity 04/01/2012   Elevated LFTs    Leg ulcer (HCC) 10/27/2012   Chronic under care of wound clinic   Multiple open wounds of lower extremity    chronic wounds B/LLE   Peripheral arterial disease    Sickle cell disease (HCC)    Sinus bradycardia by electrocardiogram 04/03/2012    Past Surgical History:  Procedure Laterality Date   ALLOGRAFT APPLICATION Right 02/14/2015   Procedure: SURGICAL PREP FOR GRAFTING RIGHT LOWER EXTREMITY AND APPLICATION OF THERASKIN;  Surgeon: Earlis Ranks, MD;  Location: [facility];  Service: Plastics;  Laterality: Right;   APPLICATION OF A-CELL OF EXTREMITY Right 10/09/2015   Procedure: APPLICATION OF THERASKIN;  Surgeon: Earlis Ranks, MD;  Location: [facility];  Service: Plastics;  Laterality: Right;   BREAST SURGERY     left breast cyst aspiration   CESAREAN SECTION     CESAREAN SECTION N/A 01/06/2014   Procedure: CESAREAN SECTION;  Surgeon: Harland JAYSON Birkenhead, MD;  Location: [facility];  Service: Obstetrics;  Laterality: N/A;   CESAREAN SECTION N/A 05/04/2017   Procedure: CESAREAN SECTION;  Surgeon: Eveline Lynwood MATSU, MD;  Location: [facility];  Service: Obstetrics;  Laterality: N/A;   I & D EXTREMITY Right 10/09/2015   Procedure: SURGICAL PREPARATION FOR GRAFTING RIGHT ANKLE AND APPLICATION THERASKIN;  Surgeon: Earlis Ranks, MD;  Location: [facility];  Service: Plastics;  Laterality: Right;   SKIN FULL THICKNESS GRAFT Bilateral 06/17/2016   Procedure: SURGICAL PREP FOR GRAFTING, BILATERAL LOWER EXTREMITIES AND APPLICATION OF THERASKIN;  Surgeon: Earlis Ranks, MD;  Location: [facility];  Service: Plastics;  Laterality: Bilateral;   TONSILLECTOMY      Family History  Problem Relation Age of Onset   Sickle cell anemia  Sister    Diabetes Mother    Asthma Mother    Sickle cell trait Mother    Sickle cell trait Father    Hypertension Father     Social History   Socioeconomic History   Marital status: Married    Spouse name: Fredia Burnet   Number of children: 3   Years of education: Not on file   Highest education level: Not on file  Occupational History   Occupation: Employed in home care.  Tobacco Use   Smoking status: Never    Passive exposure: Never   Smokeless tobacco: Never  Vaping Use   Vaping status: Never Used  Substance and Sexual Activity   Alcohol use: No   Drug use: No   Sexual activity: Yes    Birth control/protection: None  Other Topics Concern   Not on file  Social History Narrative   Lives with husband.   Social Drivers of Corporate Investment Banker Strain: Not on file  Food Insecurity: No Food Insecurity (01/12/2024)   Hunger Vital Sign    Worried About Running Out of Food in the Last Year: Never true    Ran Out of Food in the Last Year: Never true  Transportation Needs: No Transportation Needs (01/12/2024)   PRAPARE - Administrator, Civil Service (Medical): No    Lack of Transportation (Non-Medical): No  Physical Activity: Not on file  Stress: Not on file  Social Connections: Not on file  Intimate Partner Violence: Not At Risk (01/12/2024)   Humiliation, Afraid, Rape, and Kick questionnaire    Fear of Current or Ex-Partner: No    Emotionally Abused: No    Physically Abused: No    Sexually Abused: No    Outpatient Medications Prior to Visit  Medication Sig Dispense Refill   ASPIRIN  LOW DOSE 81 MG tablet TAKE 1 TABLET(81 MG) BY MOUTH DAILY 30 tablet 12   folic acid  (FOLVITE ) 1 MG tablet Take 1 tablet (1 mg total) by mouth daily. 90 tablet 3   gabapentin  (NEURONTIN ) 600 MG tablet TAKE 1 TABLET(600 MG) BY MOUTH THREE TIMES DAILY 90 tablet 2   gentamicin  ointment (GARAMYCIN ) 0.1 % Apply 1 Application topically at bedtime. Apply topically once  daily. May apply extra if dressing is wet and needs to be changed.     ibuprofen  (ADVIL ) 600 MG tablet TAKE 1 TABLET(600 MG) BY MOUTH EVERY 8 HOURS AS NEEDED (Patient taking differently: Take 600 mg by mouth every 8 (eight) hours as needed.) 30 tablet 5   Multiple Vitamin (MULTIVITAMIN) tablet Take 1 tablet by mouth daily.     mupirocin  ointment (BACTROBAN ) 2 % Apply 1 Application topically at bedtime. Apply topically once daily. May apply extra if dressing is wet and needs to be changed.     Nutritional Supplements (ENSURE HIGH PROTEIN) LIQD Take 1 each by mouth in the morning, at noon, and at bedtime. 474 mL 6   Oxycodone  HCl 10 MG TABS Take 1 tablet (10 mg total) by mouth every 4 (four) hours as needed. 90 tablet 0   pentoxifylline  (TRENTAL ) 400  MG CR tablet Take 400 mg by mouth 2 (two) times daily.     Vitamin D , Ergocalciferol , (DRISDOL ) 1.25 MG (50000 UNIT) CAPS capsule Take 1 capsule (50,000 Units total) by mouth every 7 (seven) days. On Thursday 12 capsule 1   polyethylene glycol powder (GLYCOLAX /MIRALAX ) 17 GM/SCOOP powder TAKE 17GRAMS MIXED IN FLUID BY MOUTH 2(TWO) TIMES DAILY AS NEEDED (Patient taking differently: Take 17 g by mouth 2 (two) times daily as needed for mild constipation or moderate constipation.) 510 g 3   Nutritional Supplements (JUVEN) POWD Take 1 each by mouth 3 (three) times daily between meals. (Patient not taking: Reported on 04/12/2024) 545 g 6   lactulose  (CHRONULAC ) 10 GM/15ML solution Take 45 mLs (30 g total) by mouth 2 (two) times daily. 236 mL 0   triamcinolone  ointment (KENALOG ) 0.1 % Apply topically every other day. (Patient not taking: Reported on 04/12/2024) 30 g 0   No facility-administered medications prior to visit.    No Known Allergies  ROS Review of Systems  Constitutional:  Negative for appetite change, chills, fatigue and fever.  HENT:  Negative for congestion, postnasal drip, rhinorrhea and sneezing.   Eyes:  Negative for pain, discharge and  itching.  Respiratory:  Negative for cough, shortness of breath and wheezing.   Cardiovascular:  Negative for chest pain, palpitations and leg swelling.  Gastrointestinal:  Negative for abdominal pain, constipation, nausea and vomiting.  Endocrine: Negative for cold intolerance, heat intolerance and polydipsia.  Genitourinary:  Negative for difficulty urinating, dysuria, flank pain and frequency.  Musculoskeletal:  Positive for arthralgias and joint swelling. Negative for back pain and myalgias.  Skin:  Positive for wound. Negative for pallor and rash.  Neurological:  Negative for dizziness, facial asymmetry, numbness and headaches.  Psychiatric/Behavioral:  Negative for behavioral problems, confusion, self-injury and suicidal ideas.       Objective:    Physical Exam Vitals and nursing note reviewed.  Constitutional:      General: She is not in acute distress.    Appearance: Normal appearance. She is not ill-appearing, toxic-appearing or diaphoretic.  HENT:     Right Ear: Tympanic membrane, ear canal and external ear normal. There is no impacted cerumen.     Left Ear: Tympanic membrane, ear canal and external ear normal. There is no impacted cerumen.     Nose: Nose normal. No congestion or rhinorrhea.     Mouth/Throat:     Mouth: Mucous membranes are moist.     Pharynx: Oropharynx is clear. No oropharyngeal exudate or posterior oropharyngeal erythema.  Eyes:     General: Scleral icterus present.        Right eye: No discharge.        Left eye: No discharge.     Extraocular Movements: Extraocular movements intact.  Neck:     Vascular: No carotid bruit.  Cardiovascular:     Rate and Rhythm: Normal rate and regular rhythm.     Pulses: Normal pulses.     Heart sounds: Normal heart sounds. No murmur heard.    No friction rub. No gallop.  Pulmonary:     Effort: Pulmonary effort is normal. No respiratory distress.     Breath sounds: Normal breath sounds. No stridor. No wheezing,  rhonchi or rales.  Chest:     Chest wall: No tenderness.  Abdominal:     General: There is no distension.     Palpations: Abdomen is soft.     Tenderness: There is no abdominal tenderness. There  is no right CVA tenderness, left CVA tenderness or guarding.  Musculoskeletal:        General: Tenderness present. No swelling or signs of injury.     Cervical back: No rigidity or tenderness.     Comments: Right knee swelling and tenderness, skin is warm and dry  Lymphadenopathy:     Cervical: No cervical adenopathy.  Skin:    General: Skin is warm and dry.     Capillary Refill: Capillary refill takes less than 2 seconds.     Coloration: Skin is not jaundiced or pale.     Findings: No bruising or erythema.     Comments: Bilateral lower extremity wound, compression dressing in place unable to assess site  Neurological:     Mental Status: She is alert and oriented to person, place, and time.     Motor: No weakness.     Gait: Gait normal.  Psychiatric:        Mood and Affect: Mood normal.        Behavior: Behavior normal.        Thought Content: Thought content normal.        Judgment: Judgment normal.     BP 123/60   Pulse 69   Wt 127 lb (57.6 kg)   SpO2 100%   BMI 19.89 kg/m  Wt Readings from Last 3 Encounters:  04/12/24 127 lb (57.6 kg)  01/13/24 128 lb (58.1 kg)  01/09/24 128 lb 3.2 oz (58.2 kg)    Lab Results  Component Value Date   TSH 0.099 (L) 03/20/2017   Lab Results  Component Value Date   WBC 11.6 (H) 03/31/2024   HGB 8.8 (L) 03/31/2024   HCT 25.9 (L) 03/31/2024   MCV 84.6 03/31/2024   PLT 387 03/31/2024   Lab Results  Component Value Date   NA 141 01/18/2024   K 3.7 01/18/2024   CHLORIDE 107 01/20/2014   CO2 22 01/18/2024   GLUCOSE 79 01/18/2024   BUN 11 01/18/2024   CREATININE 0.50 01/18/2024   BILITOT 1.4 (H) 01/18/2024   ALKPHOS 60 01/18/2024   AST 55 (H) 01/18/2024   ALT 22 01/18/2024   PROT 7.7 01/18/2024   ALBUMIN 3.9 01/18/2024   CALCIUM   9.8 01/18/2024   ANIONGAP 11 01/18/2024   EGFR 107 01/09/2024   Lab Results  Component Value Date   CHOL 143 11/25/2014   Lab Results  Component Value Date   HDL 27 (L) 11/25/2014   Lab Results  Component Value Date   LDLCALC 89 11/25/2014   Lab Results  Component Value Date   TRIG 137 11/25/2014   Lab Results  Component Value Date   CHOLHDL 5.3 11/25/2014   Lab Results  Component Value Date   HGBA1C 4.4 (L) 03/20/2017      Assessment & Plan:   Problem List Items Addressed This Visit       Digestive   Drug-induced constipation    - Refilled Miralax  as needed for constipation. - Encouraged hydration and dietary fiber intake.       Relevant Medications   polyethylene glycol powder (GLYCOLAX /MIRALAX ) 17 GM/SCOOP powder     Other   Chronic pain syndrome (Chronic)   Continue ibuprofen  600 mg 3 times daily as needed, mild pain, oxycodone  10 mg every 4 hours as needed moderate to severe pain, gabapentin  600 mg 3 times daily Encouraged to keep medication safe and secured and report excessive sedation      Hemochromatosis   Lab  Results  Component Value Date   FERRITIN 1,418 (H) 01/13/2024  Currently not on medication Will monitor      Sickle-cell disease with pain (HCC) - Primary    Sickle cell disease without crisis Sickle cell disease managed with medications, no current crisis. Emphasized hydration and avoiding triggers. Continue folic acid  1 mg daily - Continue gabapentin  600 mg three times daily. - Continue ibuprofen  600 mg every eight hours as needed for mild pain - Continue oxycodone  10 mg every four hours as needed for moderate to severe pain. - Encouraged hydration with 64 ounces of water daily. - Encouraged staying warm and avoiding triggers of sickle cell crisis.  Seek urgent medical attention if she experiences any sign of sickle cell crisis Referred to ophthalmologist for yearly eye exam Planning on doing simple exchange transfusion to  maintain hemoglobin level at greater than 8 to promote wound healing, has upcoming appointment with hematology today       Relevant Orders   ToxAssure Flex 15, Ur   Ambulatory referral to Ophthalmology   Bilateral leg ulcer (HCC)    - Continue wound care with daily dressing changes. - Continue mupirocin  and gentamicin  as prescribed by wound care specialist.      Vitamin D  deficiency   Last vitamin D  Lab Results  Component Value Date   VD25OH 65.43 01/13/2024  Continue vitamin D  50,000 units once weekly      Relevant Orders   VITAMIN D  25 Hydroxy (Vit-D Deficiency, Fractures)   ASCUS with positive high risk HPV cervical   High risk HPV with ASC-US , Pap smear follow-up needed High risk HPV with ASC-US  noted on previous Pap smear. Follow-up with gynecologist needed.  Gynecologist contact information provided      Annual physical exam   Adult Wellness Visit Routine wellness visit focused on health maintenance, vaccinations, and screenings. - Encouraged flu, pneumonia, tetanus, and shingles vaccines. - Encouraged moderate exercise as tolerated. - Encouraged heart-healthy, low salt, low fat diet. - Encouraged adequate hydration with 64 ounces of water daily. - Encouraged adequate rest with 7-8 hours of sleep nightly. - Encouraged use of seatbelt and safe storage of firearms. - Ordered lipid panel  Up-to-date with colon cancer screening and mammogram, please follow-up on abnormal cervical cancer screening       Acute pain of right knee   Right knee pain and swelling, possible effusion Right knee pain and swelling for one month, possible effusion suspected. - urgent  Referred to orthopedics for urgent evaluation and possible fluid drainage. Advised against using OTC NSAIDs and ibuprofen  at the same time       Relevant Orders   Ambulatory referral to Orthopedic Surgery   Screening for lipid disorders   Relevant Orders   Lipid panel    Meds ordered this encounter   Medications   polyethylene glycol powder (GLYCOLAX /MIRALAX ) 17 GM/SCOOP powder    Sig: Take 17 g by mouth daily as needed. TAKE 17GRAMS MIXED IN FLUID BY MOUTH 2(TWO) TIMES DAILY AS NEEDED    Dispense:  510 g    Refill:  3    Follow-up: Return in about 3 months (around 07/13/2024) for SCD.    Byan Poplaski R Maitland Muhlbauer, FNP

## 2024-04-12 NOTE — Assessment & Plan Note (Addendum)
 Adult Wellness Visit Routine wellness visit focused on health maintenance, vaccinations, and screenings. - Encouraged flu, pneumonia, tetanus, and shingles vaccines. - Encouraged moderate exercise as tolerated. - Encouraged heart-healthy, low salt, low fat diet. - Encouraged adequate hydration with 64 ounces of water daily. - Encouraged adequate rest with 7-8 hours of sleep nightly. - Encouraged use of seatbelt and safe storage of firearms. - Ordered lipid panel  Up-to-date with colon cancer screening and mammogram, please follow-up on abnormal cervical cancer screening

## 2024-04-12 NOTE — Assessment & Plan Note (Signed)
 High risk HPV with ASC-US , Pap smear follow-up needed High risk HPV with ASC-US  noted on previous Pap smear. Follow-up with gynecologist needed.  Gynecologist contact information provided

## 2024-04-12 NOTE — Assessment & Plan Note (Signed)
 Right knee pain and swelling, possible effusion Right knee pain and swelling for one month, possible effusion suspected. - urgent  Referred to orthopedics for urgent evaluation and possible fluid drainage. Advised against using OTC NSAIDs and ibuprofen  at the same time

## 2024-04-12 NOTE — Assessment & Plan Note (Addendum)
  Sickle cell disease without crisis Sickle cell disease managed with medications, no current crisis. Emphasized hydration and avoiding triggers. Continue folic acid  1 mg daily - Continue gabapentin  600 mg three times daily. - Continue ibuprofen  600 mg every eight hours as needed for mild pain - Continue oxycodone  10 mg every four hours as needed for moderate to severe pain. - Encouraged hydration with 64 ounces of water daily. - Encouraged staying warm and avoiding triggers of sickle cell crisis.  Seek urgent medical attention if she experiences any sign of sickle cell crisis Referred to ophthalmologist for yearly eye exam Planning on doing simple exchange transfusion to maintain hemoglobin level at greater than 8 to promote wound healing, has upcoming appointment with hematology today

## 2024-04-12 NOTE — Assessment & Plan Note (Signed)
-   Continue wound care with daily dressing changes. - Continue mupirocin  and gentamicin  as prescribed by wound care specialist.

## 2024-04-12 NOTE — Assessment & Plan Note (Signed)
-   Refilled Miralax  as needed for constipation. - Encouraged hydration and dietary fiber intake.

## 2024-04-12 NOTE — Assessment & Plan Note (Signed)
 Continue ibuprofen  600 mg 3 times daily as needed, mild pain, oxycodone  10 mg every 4 hours as needed moderate to severe pain, gabapentin  600 mg 3 times daily Encouraged to keep medication safe and secured and report excessive sedation

## 2024-04-13 ENCOUNTER — Ambulatory Visit: Payer: Self-pay | Admitting: Nurse Practitioner

## 2024-04-13 LAB — LIPID PANEL
Chol/HDL Ratio: 3.8 ratio (ref 0.0–4.4)
Cholesterol, Total: 140 mg/dL (ref 100–199)
HDL: 37 mg/dL — ABNORMAL LOW (ref >39)
LDL Chol Calc (NIH): 84 mg/dL (ref 0–99)
Triglycerides: 99 mg/dL (ref 0–149)
VLDL Cholesterol Cal: 19 mg/dL (ref 5–40)

## 2024-04-13 LAB — VITAMIN D 25 HYDROXY (VIT D DEFICIENCY, FRACTURES): Vit D, 25-Hydroxy: 45.4 ng/mL (ref 30.0–100.0)

## 2024-04-14 ENCOUNTER — Encounter (HOSPITAL_BASED_OUTPATIENT_CLINIC_OR_DEPARTMENT_OTHER): Admitting: General Surgery

## 2024-04-14 ENCOUNTER — Ambulatory Visit (HOSPITAL_COMMUNITY)
Admission: RE | Admit: 2024-04-14 | Discharge: 2024-04-14 | Disposition: A | Discharge status: Home / Self Care | Source: Ambulatory Visit | Attending: Internal Medicine | Admitting: Internal Medicine

## 2024-04-14 DIAGNOSIS — L97818 Non-pressure chronic ulcer of other part of right lower leg with other specified severity: Secondary | ICD-10-CM | POA: Diagnosis not present

## 2024-04-14 DIAGNOSIS — D571 Sickle-cell disease without crisis: Secondary | ICD-10-CM | POA: Diagnosis not present

## 2024-04-14 MED ORDER — HYDROMORPHONE HCL 2 MG/ML IJ SOLN
2.0000 mg | Freq: Once | INTRAMUSCULAR | Status: AC
  Administered 2024-04-14: 2 mg via SUBCUTANEOUS
  Filled 2024-04-14: qty 1

## 2024-04-14 NOTE — Progress Notes (Signed)
 Diagnosis: SCD    Provider:Onyeje Ijaola FNP    Procedure: Dilaudid  2 mg     Note: Pt arrived to patient care center to received pain medication injection prior to wound care appointment.  Pt received 2mg  SQ x one dose in right upper quadrant buttock.  Pt alert and oriented , ambulatory upon discharge.

## 2024-04-16 ENCOUNTER — Other Ambulatory Visit: Payer: Self-pay | Admitting: Nurse Practitioner

## 2024-04-16 DIAGNOSIS — G894 Chronic pain syndrome: Secondary | ICD-10-CM

## 2024-04-16 DIAGNOSIS — D5700 Hb-SS disease with crisis, unspecified: Secondary | ICD-10-CM

## 2024-04-16 LAB — TOXASSURE FLEX 15, UR
6-ACETYLMORPHINE IA: NEGATIVE ng/mL
7-aminoclonazepam: NOT DETECTED ng/mg{creat}
AMPHETAMINES IA: NEGATIVE ng/mL
Alpha-hydroxyalprazolam: NOT DETECTED ng/mg{creat}
Alpha-hydroxymidazolam: NOT DETECTED ng/mg{creat}
Alpha-hydroxytriazolam: NOT DETECTED ng/mg{creat}
Alprazolam: NOT DETECTED ng/mg{creat}
BARBITURATES IA: NEGATIVE ng/mL
BUPRENORPHINE: NEGATIVE
Benzodiazepines: NEGATIVE
Buprenorphine: NOT DETECTED ng/mg{creat}
CANNABINOIDS IA: NEGATIVE ng/mL
COCAINE METABOLITE IA: NEGATIVE ng/mL
Clonazepam: NOT DETECTED ng/mg{creat}
Creatinine: 50 mg/dL (ref >=20)
Desalkylflurazepam: NOT DETECTED ng/mg{creat}
Desmethyldiazepam: NOT DETECTED ng/mg{creat}
Desmethylflunitrazepam: NOT DETECTED ng/mg{creat}
Diazepam: NOT DETECTED ng/mg{creat}
ETHYL ALCOHOL Enzymatic: NEGATIVE g/dL
FENTANYL: NEGATIVE
Fentanyl: NOT DETECTED ng/mg{creat}
Flunitrazepam: NOT DETECTED ng/mg{creat}
Lorazepam: NOT DETECTED ng/mg{creat}
METHADONE IA: NEGATIVE ng/mL
METHADONE MTB IA: NEGATIVE ng/mL
Midazolam: NOT DETECTED ng/mg{creat}
Norbuprenorphine: NOT DETECTED ng/mg{creat}
Norfentanyl: NOT DETECTED ng/mg{creat}
Oxazepam: NOT DETECTED ng/mg{creat}
PHENCYCLIDINE IA: NEGATIVE ng/mL
TAPENTADOL, IA: NEGATIVE ng/mL
TRAMADOL IA: NEGATIVE ng/mL
Temazepam: NOT DETECTED ng/mg{creat}

## 2024-04-16 LAB — OXYCODONE CLASS, MS, UR RFX
Noroxycodone: 6596 ng/mg{creat}
Noroxymorphone: 2112 ng/mg{creat}
Oxycodone Class Confirmation: POSITIVE
Oxycodone: 2806 ng/mg{creat}
Oxymorphone: 6244 ng/mg{creat}

## 2024-04-16 LAB — OPIATE CLASS, MS, UR RFX
Codeine: NOT DETECTED ng/mg{creat}
Dihydrocodeine: NOT DETECTED ng/mg{creat}
Hydrocodone: NOT DETECTED ng/mg{creat}
Hydromorphone: NOT DETECTED ng/mg{creat}
Morphine: NOT DETECTED ng/mg{creat}
Norcodeine: NOT DETECTED ng/mg{creat}
Norhydrocodone: NOT DETECTED ng/mg{creat}
Normorphine: NOT DETECTED ng/mg{creat}
Opiate Class Confirmation: NEGATIVE

## 2024-04-19 NOTE — Telephone Encounter (Signed)
 Please advise [facility]

## 2024-04-22 ENCOUNTER — Other Ambulatory Visit: Payer: Self-pay | Admitting: Nurse Practitioner

## 2024-04-22 DIAGNOSIS — G894 Chronic pain syndrome: Secondary | ICD-10-CM

## 2024-04-22 DIAGNOSIS — D5700 Hb-SS disease with crisis, unspecified: Secondary | ICD-10-CM

## 2024-04-22 NOTE — Telephone Encounter (Unsigned)
 Copied from CRM #[id number]. Topic: Clinical - Medication Refill >> Apr 22, 2024 11:45 AM Delon T wrote: Medication: Oxycodone  HCl 10 MG TABS ibuprofen  (ADVIL ) 600 MG tablet  Has the patient contacted their pharmacy? No (Agent: If no, request that the patient contact the pharmacy for the refill. If patient does not wish to contact the pharmacy document the reason why and proceed with request.) (Agent: If yes, when and what did the pharmacy advise?)  This is the patient's preferred pharmacy:  [facility] #[id number] [place] - [address] AT [facility] [address] [place] [address] Phone: [phone] Fax: [phone]   Is this the correct pharmacy for this prescription? Yes If no, delete pharmacy and type the correct one.   Has the prescription been filled recently? Yes  Is the patient out of the medication? Yes  Has the patient been seen for an appointment in the last year OR does the patient have an upcoming appointment? Yes  Can we respond through MyChart? Yes  Agent: Please be advised that Rx refills may take up to 3 business days. We ask that you follow-up with your pharmacy.

## 2024-04-23 ENCOUNTER — Other Ambulatory Visit (HOSPITAL_COMMUNITY): Payer: Self-pay | Admitting: Internal Medicine

## 2024-04-23 MED ORDER — IBUPROFEN 600 MG PO TABS: 600.0000 mg | ORAL_TABLET | Freq: Three times a day (TID) | ORAL | 2 refills | Status: DC | PRN

## 2024-04-23 MED ORDER — OXYCODONE HCL 10 MG PO TABS: 10.0000 mg | ORAL_TABLET | ORAL | 0 refills | Status: DC | PRN

## 2024-04-23 NOTE — Telephone Encounter (Signed)
 Please advise [facility]

## 2024-04-26 ENCOUNTER — Ambulatory Visit (HOSPITAL_COMMUNITY)
Admission: RE | Admit: 2024-04-26 | Discharge: 2024-04-26 | Disposition: A | Discharge status: Home / Self Care | Source: Ambulatory Visit | Attending: Nurse Practitioner | Admitting: Nurse Practitioner

## 2024-04-26 DIAGNOSIS — D571 Sickle-cell disease without crisis: Secondary | ICD-10-CM | POA: Diagnosis present

## 2024-04-26 LAB — CBC
HCT: 19.6 % — ABNORMAL LOW (ref 36.0–46.0)
Hemoglobin: 6.3 g/dL — CL (ref 12.0–15.0)
MCH: 27.2 pg (ref 26.0–34.0)
MCHC: 32.1 g/dL (ref 30.0–36.0)
MCV: 84.5 fL (ref 80.0–100.0)
Platelets: 646 K/uL — ABNORMAL HIGH (ref 150–400)
RBC: 2.32 MIL/uL — ABNORMAL LOW (ref 3.87–5.11)
RDW: 21.6 % — ABNORMAL HIGH (ref 11.5–15.5)
WBC: 11.9 K/uL — ABNORMAL HIGH (ref 4.0–10.5)
nRBC: 4.2 % — ABNORMAL HIGH (ref 0.0–0.2)

## 2024-04-26 LAB — PREPARE RBC (CROSSMATCH)

## 2024-04-26 MED ORDER — SODIUM CHLORIDE 0.9% IV SOLUTION: Freq: Once | INTRAVENOUS | Status: DC

## 2024-04-26 MED ORDER — DIPHENHYDRAMINE HCL 25 MG PO CAPS: 25.0000 mg | ORAL_CAPSULE | Freq: Once | ORAL | Status: DC

## 2024-04-26 MED ORDER — ACETAMINOPHEN 500 MG PO TABS: 1000.0000 mg | ORAL_TABLET | Freq: Once | ORAL | Status: DC

## 2024-04-26 NOTE — Progress Notes (Signed)
 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 Labs drawn (Type & Screen and CBC). Patient's Hemoglobin 6.3. MD notified. Blood bank advised that patient's blood was ordered from [place] and would not arrived until 2:00 pm or after.  This would not give adequate time to exchange and transfuse the blood. Patient's Exchange transfusion appointment rescheduled for tomorrow morning at 10:00 am. Patient advised to keep on blue blood bank bracelet. Patient alert, oriented and ambulatory at discharge.

## 2024-04-26 NOTE — Progress Notes (Signed)
 AJ with lab called to report Hgb 6.3. this was noted and reported to Dr Jegede.

## 2024-04-27 ENCOUNTER — Inpatient Hospital Stay (HOSPITAL_COMMUNITY): Admission: RE | Admit: 2024-04-27 | Discharge: 2024-04-27 | Attending: Internal Medicine

## 2024-04-27 VITALS — BP 106/56 | HR 69 | Temp 97.8°F | Resp 16

## 2024-04-27 DIAGNOSIS — D571 Sickle-cell disease without crisis: Secondary | ICD-10-CM | POA: Diagnosis not present

## 2024-04-27 LAB — PREPARE RBC (CROSSMATCH)

## 2024-04-27 MED ORDER — ACETAMINOPHEN 500 MG PO TABS
1000.0000 mg | ORAL_TABLET | Freq: Once | ORAL | Status: AC
  Administered 2024-04-27: 1000 mg via ORAL
  Filled 2024-04-27: qty 2

## 2024-04-27 MED ORDER — SODIUM CHLORIDE 0.9% IV SOLUTION: Freq: Once | INTRAVENOUS | Status: AC

## 2024-04-27 MED ORDER — DIPHENHYDRAMINE HCL 25 MG PO CAPS
25.0000 mg | ORAL_CAPSULE | Freq: Once | ORAL | Status: AC
  Administered 2024-04-27: 25 mg via ORAL
  Filled 2024-04-27: qty 1

## 2024-04-27 NOTE — Progress Notes (Signed)
 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 Patient pre-medicated with PO Tylenol  and Benadryl  per order. 350 cc of blood manually drawn off from patient's PIV. Following phlebotomy, patient immediately transfused with 2 units PRBCs via PIV. Patient tolerated phlebotomy and transfusions well. Vital signs remained stable throughout transfusions. AVS offered but patient declined. Patient to receive exchange transfusions monthly and will schedule the next appointment at the front desk. Patient alert, oriented and ambulatory with cane a discharge.

## 2024-04-28 LAB — TYPE AND SCREEN
ABO/RH(D): B POS
Antibody Screen: NEGATIVE
Unit division: 0
Unit division: 0

## 2024-04-28 LAB — BPAM RBC
Blood Product Expiration Date: 202512272359
Blood Product Unit Number: 202512262359
ISSUE DATE / TIME: 202512091045
PRODUCT CODE: 202512091045
PRODUCT CODE: 202512272359
Unit Type and Rh: 7300
Unit Type and Rh: 202512262359
Unit Type and Rh: 7300
Unit Type and Rh: 7300

## 2024-05-04 ENCOUNTER — Encounter (HOSPITAL_BASED_OUTPATIENT_CLINIC_OR_DEPARTMENT_OTHER): Admitting: General Surgery

## 2024-05-04 ENCOUNTER — Ambulatory Visit (HOSPITAL_COMMUNITY)
Admission: RE | Admit: 2024-05-04 | Discharge: 2024-05-04 | Disposition: A | Source: Ambulatory Visit | Attending: Internal Medicine | Admitting: Internal Medicine

## 2024-05-04 DIAGNOSIS — L97818 Non-pressure chronic ulcer of other part of right lower leg with other specified severity: Secondary | ICD-10-CM | POA: Insufficient documentation

## 2024-05-04 DIAGNOSIS — L97828 Non-pressure chronic ulcer of other part of left lower leg with other specified severity: Secondary | ICD-10-CM | POA: Insufficient documentation

## 2024-05-04 DIAGNOSIS — D571 Sickle-cell disease without crisis: Secondary | ICD-10-CM | POA: Diagnosis not present

## 2024-05-04 MED ORDER — HYDROMORPHONE HCL 2 MG/ML IJ SOLN
2.0000 mg | Freq: Once | INTRAMUSCULAR | Status: AC
  Administered 2024-05-04: 10:00:00 2 mg via SUBCUTANEOUS
  Filled 2024-05-04: qty 1

## 2024-05-04 NOTE — Progress Notes (Signed)
 PATIENT CARE CENTER NOTE   Diagnosis: SCD   Provider: Homer, NP   Procedure: Dilaudid  2 mg injection   Note: Patient arrived to receive sub-q pain medication prior to wound care appointment. Patient received Dilaudid  2 mg sub-q injection x 1 dose. Patient tolerated injection well. Vital signs stable. Patient alert, oriented and ambulatory at discharge.

## 2024-05-05 ENCOUNTER — Other Ambulatory Visit (INDEPENDENT_AMBULATORY_CARE_PROVIDER_SITE_OTHER)

## 2024-05-05 ENCOUNTER — Ambulatory Visit: Admitting: Physician Assistant

## 2024-05-05 ENCOUNTER — Encounter: Payer: Self-pay | Admitting: Physician Assistant

## 2024-05-05 DIAGNOSIS — G8929 Other chronic pain: Secondary | ICD-10-CM

## 2024-05-05 DIAGNOSIS — M25561 Pain in right knee: Secondary | ICD-10-CM

## 2024-05-05 MED ORDER — LIDOCAINE HCL 1 % IJ SOLN
3.0000 mL | INTRAMUSCULAR | Status: AC | PRN
  Administered 2024-05-05: 12:00:00 3 mL

## 2024-05-05 NOTE — Progress Notes (Signed)
 Office Visit Note   Patient: [name]           Date of Birth: 12-Apr-1971           MRN: [id number] Visit Date: 05/05/2024              Requested by: Paseda, Folashade R, FNP [phone] [address] [place],  [place] [address] PCP: Paseda, Folashade R, FNP   Assessment & Plan: Visit Diagnoses:  1. Chronic pain of right knee     Plan: Will have her follow-up with us  in 2 weeks to see how she is doing overall.  Today the knee was aspirated 40 cc of yellow synovial fluid was attained.  Knee was wrapped with an Ace bandage which she will remove this evening.  She will work on dance movement psychotherapist.  Did not provide cortisone due to the fact that she has ulcers on both lower legs.  Questions were encouraged and answered at length.  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  Follow-Up Instructions: Return in about 2 weeks (around 05/19/2024).   Orders:  Orders Placed This Encounter  Procedures   Large Joint Inj   XR Knee 1-2 Views Right   No orders of the defined types were placed in this encounter.     Procedures: Large Joint Inj: R knee on 05/05/2024 12:14 PM Indications: pain Details: 22 G 1.5 in needle, anterolateral approach  Arthrogram: No  Medications: 3 mL lidocaine  1 % Aspirate: 40 mL yellow Outcome: tolerated well, no immediate complications Procedure, treatment alternatives, risks and benefits explained, specific risks discussed. Consent was given by the patient. Immediately prior to procedure a time out was called to verify the correct patient, procedure, equipment, support staff and site/side marked as required. Patient was prepped and draped in the usual sterile fashion.             HPI Patient is a 53 year old female were seen for the first time for right knee pain.  Pain began in September.  No known injury.  She has been taking oxycodone  and ibuprofen  for pain.  She notes swelling in the knee.  No mechanical  symptoms.  She does note that her pain is worse with going up and down stairs.  She has sickle cell anemia.  She is also being treated for ulcers on bilateral lower legs and legs and just recently been wrapped as of yesterday by the wound care center.  She is using gentamicin  ointment on the wounds. Review of Systems Denies fevers chills.  Objective: Vital Signs: There were no vitals taken for this visit.  Physical Exam General: Well-developed well-nourished female in no acute distress.  Affect appropriate.  Walks with a limp and the use of a cane. Psych: Alert and oriented x 3 Ortho Exam Bilateral knees good range of motion of both knees.  No tenderness along medial lateral joint line of either knee.  She has tenderness along the lateral aspect of the right knee peripatellar region.  Positive effusion right knee only.  No abnormal warmth or erythema of either knee.  Bandages were not taken down on either leg.  Calves were supple and nontender bilaterally Specialty Comments:  No specialty comments available.  Imaging: XR Knee 1-2 Views Right Result Date: 05/05/2024 Right knee 2 views: No acute fractures.  Knee is well located.  Medial lateral joint line overall well-preserved.  Mild patellofemoral arthritic changes.  Bony infarcts tibial shaft remain unchanged from  prior films.    PMFS History: Patient Active Problem List   Diagnosis Date Noted   Annual physical exam 04/12/2024   Acute pain of right knee 04/12/2024   Screening for lipid disorders 04/12/2024   Pain in both lower extremities 01/12/2024   Sickle cell disease without crisis (HCC) 01/12/2024   Acute on chronic anemia 01/12/2024   ASCUS with positive high risk HPV cervical 01/09/2024   Acute cystitis without hematuria 01/09/2024   Vitamin D  deficiency 10/07/2023   Screening for cervical cancer 10/07/2023   Screening mammogram for breast cancer 10/07/2023   Symptomatic anemia 03/20/2022   Protein-calorie malnutrition,  severe 04/06/2021   Bilateral leg ulcer (HCC) 03/28/2021   Low hemoglobin 05/01/2020   At risk for seizures due to oxygen  toxicity 02/09/2020   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   Venous stasis 01/19/2020   Partial loss of skin graft 01/12/2020   Acute hypoxic respiratory failure (HCC) 12/15/2019   Bone infarction of distal tibia, 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   Sickle-cell disease with pain (HCC) 09/03/2018   Other chronic pain    Sickle cell crisis (HCC) 06/26/2018   Sickle cell pain crisis (HCC) 04/07/2018   Avascular necrosis of bone of hip, left (HCC) 12/08/2017   Avascular necrosis of bone of hip, right (HCC) 12/08/2017   Hb-SS disease without crisis (HCC) 11/26/2017   Anemia of chronic disease 11/26/2017   Abnormal quad screen    Ringworm of body 04/05/2017   Ulcer of right lower extremity (HCC)    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HCC) 03/02/2017   History of pre-eclampsia 12/16/2016   Venous stasis ulcer of right ankle with fat layer exposed with varicose veins (HCC) 11/28/2016   Previous cesarean delivery x 2 07/22/2013   Hemochromatosis 11/10/2012   Chronic pain syndrome 10/13/2012   Complicated wound infection 04/01/2012   Drug-induced constipation 04/01/2012   Leukocytosis 03/30/2012   Past Medical History:  Diagnosis Date   Anemia 03/30/2012   Hx of sickle cell disease   CAP (community acquired pneumonia)    2014   Cholelithiasis 03/30/2012   Chronic wound of extremity 04/01/2012   Elevated LFTs    Leg ulcer (HCC) 10/27/2012   Chronic under care of wound clinic   Multiple open wounds of lower extremity    chronic wounds B/LLE   Peripheral arterial disease    Sickle cell disease (HCC)    Sinus bradycardia by electrocardiogram 04/03/2012    Family History  Problem Relation Age of Onset   Sickle cell anemia Sister    Diabetes Mother    Asthma Mother    Sickle cell trait Mother    Sickle cell trait Father    Hypertension Father     Past Surgical  History:  Procedure Laterality Date   ALLOGRAFT APPLICATION Right 02/14/2015   Procedure: SURGICAL PREP FOR GRAFTING RIGHT LOWER EXTREMITY AND APPLICATION OF THERASKIN;  Surgeon: Earlis Ranks, MD;  Location: [facility];  Service: Plastics;  Laterality: Right;   APPLICATION OF A-CELL OF EXTREMITY Right 10/09/2015   Procedure: APPLICATION OF THERASKIN;  Surgeon: Earlis Ranks, MD;  Location:  SURGERY CENTER;  Service: Plastics;  Laterality: Right;   BREAST SURGERY     left breast cyst aspiration   CESAREAN SECTION     CESAREAN SECTION N/A 01/06/2014   Procedure: CESAREAN SECTION;  Surgeon: Harland JAYSON Birkenhead, MD;  Location: [facility];  Service: Obstetrics;  Laterality: N/A;   CESAREAN SECTION N/A 05/04/2017   Procedure: CESAREAN SECTION;  Surgeon: Eveline Lynwood MATSU, MD;  Location: [facility];  Service: Obstetrics;  Laterality: N/A;   I & D EXTREMITY Right 10/09/2015   Procedure: SURGICAL PREPARATION FOR GRAFTING RIGHT ANKLE AND APPLICATION THERASKIN;  Surgeon: Earlis Ranks, MD;  Location: [facility];  Service: Plastics;  Laterality: Right;   SKIN FULL THICKNESS GRAFT Bilateral 06/17/2016   Procedure: SURGICAL PREP FOR GRAFTING, BILATERAL LOWER EXTREMITIES AND APPLICATION OF THERASKIN;  Surgeon: Earlis Ranks, MD;  Location: [facility];  Service: Plastics;  Laterality: Bilateral;   TONSILLECTOMY     Social History   Occupational History   Occupation: Employed in home care.  Tobacco Use   Smoking status: Never    Passive exposure: Never   Smokeless tobacco: Never  Vaping Use   Vaping status: Never Used  Substance and Sexual Activity   Alcohol use: No   Drug use: No   Sexual activity: Yes    Birth control/protection: None

## 2024-05-11 ENCOUNTER — Other Ambulatory Visit: Payer: Self-pay | Admitting: Nurse Practitioner

## 2024-05-11 DIAGNOSIS — D5700 Hb-SS disease with crisis, unspecified: Secondary | ICD-10-CM

## 2024-05-11 DIAGNOSIS — G894 Chronic pain syndrome: Secondary | ICD-10-CM

## 2024-05-11 NOTE — Telephone Encounter (Unsigned)
 Copied from CRM [id number]. Topic: Clinical - Medication Refill >> May 11, 2024  1:01 PM Shanda MATSU wrote: Medication: Oxycodone  HCl 10 MG TABS   Has the patient contacted their pharmacy? Yes, no refills on file (Agent: If no, request that the patient contact the pharmacy for the refill. If patient does not wish to contact the pharmacy document the reason why and proceed with request.) (Agent: If yes, when and what did the pharmacy advise?)  This is the patient's preferred pharmacy:  [id number], [place] - [address] AT [facility] [address] [place] [address] Phone: [phone] Fax: [phone]   Is this the correct pharmacy for this prescription? Yes If no, delete pharmacy and type the correct one.   Has the prescription been filled recently? No  Is the patient out of the medication? Yes  Has the patient been seen for an appointment in the last year OR does the patient have an upcoming appointment? Yes  Can we respond through MyChart? Yes  Agent: Please be advised that Rx refills may take up to 3 business days. We ask that you follow-up with your pharmacy.

## 2024-05-12 MED ORDER — OXYCODONE HCL 10 MG PO TABS: 10.0000 mg | ORAL_TABLET | ORAL | 0 refills | Status: DC | PRN

## 2024-05-18 ENCOUNTER — Encounter (HOSPITAL_BASED_OUTPATIENT_CLINIC_OR_DEPARTMENT_OTHER): Admitting: General Surgery

## 2024-05-18 ENCOUNTER — Ambulatory Visit (HOSPITAL_COMMUNITY)
Admission: RE | Admit: 2024-05-18 | Discharge: 2024-05-18 | Disposition: A | Discharge status: Home / Self Care | Source: Ambulatory Visit | Attending: Internal Medicine | Admitting: Internal Medicine

## 2024-05-18 DIAGNOSIS — L97818 Non-pressure chronic ulcer of other part of right lower leg with other specified severity: Secondary | ICD-10-CM | POA: Diagnosis not present

## 2024-05-18 DIAGNOSIS — D571 Sickle-cell disease without crisis: Secondary | ICD-10-CM | POA: Diagnosis not present

## 2024-05-18 MED ORDER — HYDROMORPHONE HCL 2 MG/ML IJ SOLN
2.0000 mg | Freq: Once | INTRAMUSCULAR | Status: AC
  Administered 2024-05-18: 2 mg via SUBCUTANEOUS
  Filled 2024-05-18: qty 1

## 2024-05-18 NOTE — Progress Notes (Signed)
 Diagnosis:SCD    Provider: Homer Cherylene PIETY    Procedure: Dilaudid  2mg  SQ    Note: Patient arrived to the patient care center to receive pain medication injection prior to wound care appt.  Patient alert, ambulatory with cane , oriented upon discharge to her wound care appt at 245 pm

## 2024-05-19 ENCOUNTER — Other Ambulatory Visit: Payer: Self-pay | Admitting: Radiology

## 2024-05-19 ENCOUNTER — Ambulatory Visit: Admitting: Physician Assistant

## 2024-05-19 ENCOUNTER — Encounter: Payer: Self-pay | Admitting: Physician Assistant

## 2024-05-19 DIAGNOSIS — M25561 Pain in right knee: Secondary | ICD-10-CM

## 2024-05-19 NOTE — Progress Notes (Signed)
 HPI: [name] returns today follow-up of her right knee status post aspiration.  She states she still having significant pain in the knee swelling tightness describes no mechanical symptoms.  She notes that the knee swelled again.  She has been using Ace bandages been doing quad strengthening.  Again she has ulcerations being treated on both lower extremities and has Ace wrap's present.  She has sickle cell disease and history of bilateral hip avascular necrosis.  Radiographs of the right knee did show bony infarct in the tibial shaft that was unchanged from prior films.  There is also bony infarct in the lateral femoral condyle region that is also unchanged from prior films.   Physical exam: General Well-developed well-nourished female no acute distress ambulates with cane and antalgic gait.  Right knee: Full range of motion right knee.  Positive effusion right knee no abnormal warmth erythema either knee.  Tenderness right knee only anterior medial and peripatellar aspect of the knee.  [name]'s negative on the right knee.   Impression: Right knee pain with recurrent effusion  Plan: Will obtain an MRI of the right knee to rule out internal derangement and also evaluate bony infarcts in the distal femur and proximal tibia.  Have her follow-up with Dr. Vernetta 1 week after MRI to go over results and discuss further treatment.  Questions were encouraged and answered.  Patient asked pain medicine as she is already on oxycodone  would not provide her with anything stronger.  She also is taking ibuprofen  for pain.

## 2024-05-21 ENCOUNTER — Other Ambulatory Visit: Payer: Self-pay | Admitting: Nurse Practitioner

## 2024-05-21 DIAGNOSIS — D571 Sickle-cell disease without crisis: Secondary | ICD-10-CM

## 2024-05-24 ENCOUNTER — Ambulatory Visit (HOSPITAL_COMMUNITY)
Admission: RE | Admit: 2024-05-24 | Discharge: 2024-05-24 | Disposition: A | Discharge status: Home / Self Care | Source: Ambulatory Visit | Attending: Internal Medicine | Admitting: Internal Medicine

## 2024-05-24 ENCOUNTER — Ambulatory Visit: Payer: Self-pay | Admitting: Nurse Practitioner

## 2024-05-24 ENCOUNTER — Other Ambulatory Visit: Payer: Self-pay

## 2024-05-24 DIAGNOSIS — D571 Sickle-cell disease without crisis: Secondary | ICD-10-CM | POA: Diagnosis present

## 2024-05-24 LAB — CBC WITH DIFFERENTIAL/PLATELET
Abs Immature Granulocytes: 0.07 K/uL (ref 0.00–0.07)
Basophils Absolute: 0.1 K/uL (ref 0.0–0.1)
Basophils Relative: 1 %
Eosinophils Absolute: 0.8 K/uL — ABNORMAL HIGH (ref 0.0–0.5)
Eosinophils Relative: 6 %
HCT: 18.5 % — ABNORMAL LOW (ref 36.0–46.0)
Hemoglobin: 6.3 g/dL — CL (ref 12.0–15.0)
Immature Granulocytes: 1 %
Lymphocytes Relative: 20 %
Lymphs Abs: 2.6 K/uL (ref 0.7–4.0)
MCH: 29.2 pg (ref 26.0–34.0)
MCHC: 34.1 g/dL (ref 30.0–36.0)
MCV: 85.6 fL (ref 80.0–100.0)
Monocytes Absolute: 1.6 K/uL — ABNORMAL HIGH (ref 0.1–1.0)
Monocytes Relative: 12 %
Neutro Abs: 7.9 K/uL — ABNORMAL HIGH (ref 1.7–7.7)
Neutrophils Relative %: 60 %
Platelets: 540 K/uL — ABNORMAL HIGH (ref 150–400)
RBC: 2.16 MIL/uL — ABNORMAL LOW (ref 3.87–5.11)
RDW: 22.5 % — ABNORMAL HIGH (ref 11.5–15.5)
Smear Review: NORMAL
WBC: 13.1 K/uL — ABNORMAL HIGH (ref 4.0–10.5)
nRBC: 6.4 % — ABNORMAL HIGH (ref 0.0–0.2)

## 2024-05-24 LAB — IRON AND TIBC
Iron: 90 ug/dL (ref 28–170)
Saturation Ratios: 55 % — ABNORMAL HIGH (ref 10.4–31.8)
TIBC: 165 ug/dL — ABNORMAL LOW (ref 250–450)
UIBC: 75 ug/dL

## 2024-05-24 LAB — FERRITIN: Ferritin: 1920 ng/mL — ABNORMAL HIGH (ref 11–307)

## 2024-05-24 LAB — PREPARE RBC (CROSSMATCH)

## 2024-05-24 MED ORDER — SODIUM CHLORIDE 0.9% IV SOLUTION: Freq: Once | INTRAVENOUS | Status: DC

## 2024-05-24 NOTE — Progress Notes (Signed)
 Patient's labs drawn by phlebotomist. CBC w/diff, Iron  panel and Type & Screen drawn per order from Lorice Shall, FNP. Blue blood bank bracelet place on patient's wrist. Patient to come back tomorrow morning for simple blood exchange transfusion. Patient advised to keep bracelet on. Patient alert, oriented and ambulatory at discharge.

## 2024-05-25 ENCOUNTER — Ambulatory Visit (HOSPITAL_COMMUNITY)
Admission: RE | Admit: 2024-05-25 | Discharge: 2024-05-25 | Disposition: A | Discharge status: Home / Self Care | Source: Ambulatory Visit | Attending: Internal Medicine | Admitting: Internal Medicine

## 2024-05-25 VITALS — BP 120/68 | HR 57 | Temp 98.8°F | Resp 16

## 2024-05-25 DIAGNOSIS — D571 Sickle-cell disease without crisis: Secondary | ICD-10-CM | POA: Diagnosis not present

## 2024-05-25 LAB — PREPARE RBC (CROSSMATCH)

## 2024-05-25 MED ORDER — ACETAMINOPHEN 500 MG PO TABS
1000.0000 mg | ORAL_TABLET | Freq: Once | ORAL | Status: AC
  Administered 2024-05-25: 1000 mg via ORAL
  Filled 2024-05-25: qty 2

## 2024-05-25 MED ORDER — DIPHENHYDRAMINE HCL 25 MG PO CAPS
25.0000 mg | ORAL_CAPSULE | Freq: Once | ORAL | Status: AC
  Administered 2024-05-25: 25 mg via ORAL
  Filled 2024-05-25: qty 1

## 2024-05-25 MED ORDER — SODIUM CHLORIDE 0.9% IV SOLUTION: Freq: Once | INTRAVENOUS | Status: AC

## 2024-05-25 NOTE — Progress Notes (Signed)
"                PATIENT CARE CENTER NOTE     Diagnosis: Sickle Cell Anemia without crisis     Provider: Paseda, Fola, FNP     Procedure: Simple exchange transfusion     Note: Patient arrived for exchange transfusion. Pre-transfusion hemoglobin was 6.3. Patient pre-medicated with PO Tylenol  and Benadryl  per order. 350 cc of blood manually drawn off from patient's PIV. Following phlebotomy, patient immediately transfused with 2 units PRBCs via PIV. Patient tolerated phlebotomy and transfusions well. Vital signs remained stable throughout transfusions. AVS offered but patient declined. Patient to receive exchange transfusions monthly and will schedule the next appointment at the front desk. Patient alert, oriented and ambulatory with cane a discharge. "

## 2024-05-26 LAB — BPAM RBC
Blood Product Expiration Date: 202602072359
Blood Product Expiration Date: 202602102359
ISSUE DATE / TIME: 202601060917
ISSUE DATE / TIME: 202601060917
Unit Type and Rh: 7300
Unit Type and Rh: 7300

## 2024-05-26 LAB — TYPE AND SCREEN
ABO/RH(D): B POS
Antibody Screen: NEGATIVE
Unit division: 0
Unit division: 0

## 2024-05-31 ENCOUNTER — Encounter: Payer: Self-pay | Admitting: Physician Assistant

## 2024-06-01 ENCOUNTER — Ambulatory Visit (HOSPITAL_COMMUNITY)
Admission: RE | Admit: 2024-06-01 | Discharge: 2024-06-01 | Disposition: A | Source: Ambulatory Visit | Attending: Internal Medicine | Admitting: Internal Medicine

## 2024-06-01 ENCOUNTER — Encounter (HOSPITAL_BASED_OUTPATIENT_CLINIC_OR_DEPARTMENT_OTHER): Attending: General Surgery | Admitting: General Surgery

## 2024-06-01 ENCOUNTER — Other Ambulatory Visit: Payer: Self-pay | Admitting: Nurse Practitioner

## 2024-06-01 DIAGNOSIS — D571 Sickle-cell disease without crisis: Secondary | ICD-10-CM | POA: Diagnosis not present

## 2024-06-01 DIAGNOSIS — L97828 Non-pressure chronic ulcer of other part of left lower leg with other specified severity: Secondary | ICD-10-CM | POA: Diagnosis not present

## 2024-06-01 DIAGNOSIS — G894 Chronic pain syndrome: Secondary | ICD-10-CM

## 2024-06-01 DIAGNOSIS — L97818 Non-pressure chronic ulcer of other part of right lower leg with other specified severity: Secondary | ICD-10-CM | POA: Diagnosis present

## 2024-06-01 DIAGNOSIS — D5700 Hb-SS disease with crisis, unspecified: Secondary | ICD-10-CM

## 2024-06-01 MED ORDER — HYDROMORPHONE HCL 2 MG/ML IJ SOLN
2.0000 mg | Freq: Once | INTRAMUSCULAR | Status: AC
  Administered 2024-06-01: 2 mg via SUBCUTANEOUS
  Filled 2024-06-01: qty 1

## 2024-06-01 MED ORDER — OXYCODONE HCL 10 MG PO TABS: 10.0000 mg | ORAL_TABLET | ORAL | 0 refills | Status: DC | PRN

## 2024-06-01 NOTE — Progress Notes (Signed)
 Patient came to patient care center to get pretreated with Dilaudid  85m g SQ for treatment of wound care on bilateral feet at wound clinic.  Patient is alert , oriented and ambulatory with cane as per her norm.

## 2024-06-02 ENCOUNTER — Inpatient Hospital Stay
Admission: RE | Admit: 2024-06-02 | Discharge: 2024-06-02 | Attending: Physician Assistant | Admitting: Physician Assistant

## 2024-06-02 DIAGNOSIS — M25561 Pain in right knee: Secondary | ICD-10-CM

## 2024-06-15 ENCOUNTER — Ambulatory Visit (HOSPITAL_COMMUNITY)

## 2024-06-15 ENCOUNTER — Encounter (HOSPITAL_BASED_OUTPATIENT_CLINIC_OR_DEPARTMENT_OTHER): Admitting: General Surgery

## 2024-06-21 ENCOUNTER — Ambulatory Visit: Admitting: Orthopaedic Surgery

## 2024-06-21 ENCOUNTER — Other Ambulatory Visit (HOSPITAL_COMMUNITY)

## 2024-06-22 ENCOUNTER — Ambulatory Visit: Payer: Self-pay | Admitting: Nurse Practitioner

## 2024-06-22 ENCOUNTER — Ambulatory Visit (HOSPITAL_COMMUNITY)
Admission: RE | Admit: 2024-06-22 | Discharge: 2024-06-22 | Disposition: A | Discharge status: Home / Self Care | Source: Ambulatory Visit | Attending: Internal Medicine | Admitting: Internal Medicine

## 2024-06-22 ENCOUNTER — Other Ambulatory Visit: Payer: Self-pay | Admitting: Nurse Practitioner

## 2024-06-22 ENCOUNTER — Encounter (HOSPITAL_BASED_OUTPATIENT_CLINIC_OR_DEPARTMENT_OTHER): Admitting: General Surgery

## 2024-06-22 ENCOUNTER — Ambulatory Visit (HOSPITAL_COMMUNITY)

## 2024-06-22 DIAGNOSIS — G894 Chronic pain syndrome: Secondary | ICD-10-CM

## 2024-06-22 DIAGNOSIS — D571 Sickle-cell disease without crisis: Secondary | ICD-10-CM

## 2024-06-22 LAB — CBC WITH DIFFERENTIAL/PLATELET
Abs Immature Granulocytes: 0.1 10*3/uL — ABNORMAL HIGH (ref 0.00–0.07)
Basophils Absolute: 0.2 10*3/uL — ABNORMAL HIGH (ref 0.0–0.1)
Basophils Relative: 1 %
Eosinophils Absolute: 0.9 10*3/uL — ABNORMAL HIGH (ref 0.0–0.5)
Eosinophils Relative: 6 %
HCT: 21.7 % — ABNORMAL LOW (ref 36.0–46.0)
Hemoglobin: 6.8 g/dL — CL (ref 12.0–15.0)
Immature Granulocytes: 1 %
Lymphocytes Relative: 17 %
Lymphs Abs: 2.5 10*3/uL (ref 0.7–4.0)
MCH: 26.5 pg (ref 26.0–34.0)
MCHC: 31.3 g/dL (ref 30.0–36.0)
MCV: 84.4 fL (ref 80.0–100.0)
Monocytes Absolute: 1.5 10*3/uL — ABNORMAL HIGH (ref 0.1–1.0)
Monocytes Relative: 10 %
Neutro Abs: 9.6 10*3/uL — ABNORMAL HIGH (ref 1.7–7.7)
Neutrophils Relative %: 65 %
Platelets: 606 10*3/uL — ABNORMAL HIGH (ref 150–400)
RBC: 2.57 MIL/uL — ABNORMAL LOW (ref 3.87–5.11)
RDW: 20.7 % — ABNORMAL HIGH (ref 11.5–15.5)
WBC: 14.8 10*3/uL — ABNORMAL HIGH (ref 4.0–10.5)
nRBC: 1.7 % — ABNORMAL HIGH (ref 0.0–0.2)

## 2024-06-22 LAB — PREPARE RBC (CROSSMATCH)

## 2024-06-22 LAB — FERRITIN: Ferritin: 3044 ng/mL — ABNORMAL HIGH (ref 11–307)

## 2024-06-22 MED ORDER — SODIUM CHLORIDE 0.9% IV SOLUTION: Freq: Once | INTRAVENOUS | Status: DC

## 2024-06-22 MED ORDER — DEFERASIROX 360 MG PO TABS: 720.0000 mg | ORAL_TABLET | Freq: Every day | ORAL | 3 refills | Status: AC

## 2024-06-22 NOTE — Progress Notes (Addendum)
"                                 Critical Value   Test result: Hemoglobin 6.8  Time and Date notified: 2/3/026 at 12:30 pm  Provider notified: Jegede, Olu, MD  Action taken:  Patient scheduled to come for an exchange transfusion tomorrow.   "

## 2024-06-22 NOTE — Progress Notes (Unsigned)
 SABRA

## 2024-06-22 NOTE — Progress Notes (Signed)
"                 PATIENT CARE CENTER NOTE   Diagnosis: Sickle Cell Anemia without crisis    Provider: Juanice Parson, FNP   Procedure: Lab work   Note: Patient's labs drawn per peripherally. CBC w/diff, Type & Screen and Ferritin drawn per Parson, FNP.  Patient tolerated well. Blue blood bank bracelet placed on patient's wrist. Patient scheduled to come for a Simple Exchange transfusion tomorrow. Advised patient to keep blood bank bracelet on until after transfusion.  Patient alert, oriented and ambulatory at discharge.   "

## 2024-06-23 ENCOUNTER — Ambulatory Visit (HOSPITAL_COMMUNITY)
Admission: RE | Admit: 2024-06-23 | Discharge: 2024-06-23 | Disposition: A | Source: Ambulatory Visit | Attending: Internal Medicine | Admitting: Internal Medicine

## 2024-06-23 VITALS — BP 111/64 | HR 57 | Temp 97.8°F | Resp 16

## 2024-06-23 DIAGNOSIS — D571 Sickle-cell disease without crisis: Secondary | ICD-10-CM

## 2024-06-23 LAB — COMPREHENSIVE METABOLIC PANEL WITH GFR
ALT: 15 U/L (ref 0–44)
AST: 40 U/L (ref 15–41)
Albumin: 3.9 g/dL (ref 3.5–5.0)
Alkaline Phosphatase: 65 U/L (ref 38–126)
Anion gap: 8 (ref 5–15)
BUN: 6 mg/dL (ref 6–20)
CO2: 23 mmol/L (ref 22–32)
Calcium: 9 mg/dL (ref 8.9–10.3)
Chloride: 110 mmol/L (ref 98–111)
Creatinine, Ser: 0.7 mg/dL (ref 0.44–1.00)
GFR, Estimated: >60 mL/min
Glucose, Bld: 78 mg/dL (ref 70–99)
Potassium: 3.4 mmol/L — ABNORMAL LOW (ref 3.5–5.1)
Sodium: 140 mmol/L (ref 135–145)
Total Bilirubin: 1.5 mg/dL — ABNORMAL HIGH (ref 0.0–1.2)
Total Protein: 7.3 g/dL (ref 6.5–8.1)

## 2024-06-23 LAB — PREPARE RBC (CROSSMATCH)

## 2024-06-23 MED ORDER — ACETAMINOPHEN 500 MG PO TABS
ORAL_TABLET | ORAL | Status: AC
  Administered 2024-06-23: 1000 mg via ORAL
  Filled 2024-06-23: qty 2

## 2024-06-23 MED ORDER — DIPHENHYDRAMINE HCL 25 MG PO CAPS
25.0000 mg | ORAL_CAPSULE | Freq: Once | ORAL | Status: AC
  Administered 2024-06-23: 25 mg via ORAL
  Filled 2024-06-23: qty 1

## 2024-06-23 MED ORDER — SODIUM CHLORIDE 0.9% IV SOLUTION: Freq: Once | INTRAVENOUS | Status: AC

## 2024-06-23 MED ORDER — ACETAMINOPHEN 500 MG PO TABS
1000.0000 mg | ORAL_TABLET | Freq: Once | ORAL | Status: AC
  Filled 2024-06-23: qty 2

## 2024-06-23 NOTE — Progress Notes (Addendum)
 PATIENT CARE CENTER NOTE:  Diagnosis: Sickle Cell Disease without crisis  Provider: Karyle Schatz MD  Procedure: simple exchange transfusion   Patient received via PIV 2 units of packed red blood cells. Type and screen was done before transfusion on 06/22/24. Pre transfusion medications Tylenol  1000mg  PO and Benadryl  25mg  PO were given. Therapeutic phlebotomy performed, 170cc of blood drawn off manually from PIV, unable to draw off more due to loss of brisk blood return from IV, but flushes without pain or difficulty. Attempted to restart IV x 2, pt c/o pain with insertion and declined IV team to start new line. Provider notified, instructed to administer 2 units of PRBC as ordered for Hgb under 7. Discussed this with pt, verbalized understanding. Tolerated transfusion well, vitals stable,  post transfusion labs not required. Pt declined printed AVS. Pt to RTC on 3/3 and 07/21/24 for T/S and monthly blood tranfusion, reviewed date and time with pt, verbalized understanding. Patient alert, oriented and ambulatory with cane at the time of discharge.

## 2024-06-24 ENCOUNTER — Other Ambulatory Visit: Payer: Self-pay

## 2024-06-24 ENCOUNTER — Inpatient Hospital Stay (HOSPITAL_COMMUNITY): Admission: RE | Admit: 2024-06-24 | Discharge: 2024-06-24 | Attending: Internal Medicine

## 2024-06-24 ENCOUNTER — Encounter (HOSPITAL_BASED_OUTPATIENT_CLINIC_OR_DEPARTMENT_OTHER): Admitting: General Surgery

## 2024-06-24 DIAGNOSIS — D5700 Hb-SS disease with crisis, unspecified: Secondary | ICD-10-CM

## 2024-06-24 DIAGNOSIS — G894 Chronic pain syndrome: Secondary | ICD-10-CM

## 2024-06-24 LAB — BPAM RBC
Blood Product Expiration Date: 202602242359
Blood Product Unit Number: 202602202359
ISSUE DATE / TIME: 202602041014
PRODUCT CODE: 202602041014
PRODUCT CODE: 202602242359
Unit Type and Rh: 7300
Unit Type and Rh: 202602202359
Unit Type and Rh: 7300
Unit Type and Rh: 7300

## 2024-06-24 LAB — TYPE AND SCREEN
ABO/RH(D): B POS
Antibody Screen: NEGATIVE
Unit division: 0
Unit division: 0
Unit division: 0

## 2024-06-24 MED ORDER — HYDROMORPHONE HCL 2 MG/ML IJ SOLN
2.0000 mg | Freq: Once | INTRAMUSCULAR | Status: AC
  Administered 2024-06-24: 2 mg via SUBCUTANEOUS
  Filled 2024-06-24: qty 1

## 2024-06-24 NOTE — Telephone Encounter (Signed)
 Please advise [facility]

## 2024-06-24 NOTE — Progress Notes (Signed)
 Patient came to [facility] to get pre medicated for wound care treatment with Dilaudid  2 mg SQ.  Patient is alert , oriented and ambulatory with her cane as per her norm.

## 2024-06-25 MED ORDER — IBUPROFEN 600 MG PO TABS: 600.0000 mg | ORAL_TABLET | Freq: Three times a day (TID) | ORAL | 2 refills | Status: AC | PRN

## 2024-06-25 MED ORDER — OXYCODONE HCL 10 MG PO TABS: 10.0000 mg | ORAL_TABLET | ORAL | 0 refills | Status: AC | PRN

## 2024-07-08 ENCOUNTER — Encounter (HOSPITAL_BASED_OUTPATIENT_CLINIC_OR_DEPARTMENT_OTHER): Admitting: General Surgery

## 2024-07-08 ENCOUNTER — Ambulatory Visit (HOSPITAL_COMMUNITY)

## 2024-07-12 ENCOUNTER — Ambulatory Visit: Payer: Self-pay | Admitting: Nurse Practitioner

## 2024-07-14 ENCOUNTER — Ambulatory Visit: Admitting: Orthopaedic Surgery

## 2024-07-20 ENCOUNTER — Other Ambulatory Visit (HOSPITAL_COMMUNITY)

## 2024-07-21 ENCOUNTER — Ambulatory Visit (HOSPITAL_COMMUNITY)
